# Patient Record
Sex: Female | Born: 1955 | Race: Black or African American | Hispanic: No | Smoking: Former smoker
Health system: Southern US, Community
[De-identification: ages and names within clinical notes are randomized; demographics above are authoritative.]

## PROBLEM LIST (undated history)

## (undated) DIAGNOSIS — Z9221 Personal history of antineoplastic chemotherapy: Secondary | ICD-10-CM

## (undated) DIAGNOSIS — E559 Vitamin D deficiency, unspecified: Secondary | ICD-10-CM

## (undated) DIAGNOSIS — D649 Anemia, unspecified: Secondary | ICD-10-CM

## (undated) DIAGNOSIS — Z9981 Dependence on supplemental oxygen: Secondary | ICD-10-CM

## (undated) DIAGNOSIS — F419 Anxiety disorder, unspecified: Secondary | ICD-10-CM

## (undated) DIAGNOSIS — I509 Heart failure, unspecified: Secondary | ICD-10-CM

## (undated) DIAGNOSIS — R011 Cardiac murmur, unspecified: Secondary | ICD-10-CM

## (undated) DIAGNOSIS — E785 Hyperlipidemia, unspecified: Secondary | ICD-10-CM

## (undated) DIAGNOSIS — F329 Major depressive disorder, single episode, unspecified: Secondary | ICD-10-CM

## (undated) DIAGNOSIS — I890 Lymphedema, not elsewhere classified: Secondary | ICD-10-CM

## (undated) DIAGNOSIS — J449 Chronic obstructive pulmonary disease, unspecified: Secondary | ICD-10-CM

## (undated) DIAGNOSIS — N184 Chronic kidney disease, stage 4 (severe): Secondary | ICD-10-CM

## (undated) DIAGNOSIS — C801 Malignant (primary) neoplasm, unspecified: Secondary | ICD-10-CM

## (undated) DIAGNOSIS — Z7901 Long term (current) use of anticoagulants: Secondary | ICD-10-CM

## (undated) DIAGNOSIS — Z923 Personal history of irradiation: Secondary | ICD-10-CM

## (undated) DIAGNOSIS — I4891 Unspecified atrial fibrillation: Secondary | ICD-10-CM

## (undated) DIAGNOSIS — K219 Gastro-esophageal reflux disease without esophagitis: Secondary | ICD-10-CM

## (undated) DIAGNOSIS — F32A Depression, unspecified: Secondary | ICD-10-CM

## (undated) DIAGNOSIS — I10 Essential (primary) hypertension: Secondary | ICD-10-CM

## (undated) DIAGNOSIS — C3491 Malignant neoplasm of unspecified part of right bronchus or lung: Secondary | ICD-10-CM

## (undated) DIAGNOSIS — IMO0001 Reserved for inherently not codable concepts without codable children: Secondary | ICD-10-CM

## (undated) DIAGNOSIS — C50919 Malignant neoplasm of unspecified site of unspecified female breast: Secondary | ICD-10-CM

## (undated) DIAGNOSIS — R0609 Other forms of dyspnea: Secondary | ICD-10-CM

## (undated) DIAGNOSIS — R911 Solitary pulmonary nodule: Secondary | ICD-10-CM

## (undated) DIAGNOSIS — E119 Type 2 diabetes mellitus without complications: Secondary | ICD-10-CM

## (undated) DIAGNOSIS — I700 Atherosclerosis of aorta: Secondary | ICD-10-CM

## (undated) DIAGNOSIS — M1990 Unspecified osteoarthritis, unspecified site: Secondary | ICD-10-CM

## (undated) HISTORY — PX: ROTATOR CUFF REPAIR: SHX139

## (undated) HISTORY — PX: OTHER SURGICAL HISTORY: SHX169

## (undated) HISTORY — PX: BREAST SURGERY: SHX581

## (undated) HISTORY — PX: LUNG BIOPSY: SHX232

## (undated) HISTORY — PX: MASTECTOMY: SHX3

## (undated) HISTORY — PX: DILATION AND CURETTAGE OF UTERUS: SHX78

## (undated) HISTORY — PX: ILEOSTOMY: SHX1783

---

## 1998-10-15 DIAGNOSIS — C50919 Malignant neoplasm of unspecified site of unspecified female breast: Secondary | ICD-10-CM

## 1998-10-15 DIAGNOSIS — C50912 Malignant neoplasm of unspecified site of left female breast: Secondary | ICD-10-CM

## 1998-10-15 HISTORY — DX: Malignant neoplasm of unspecified site of left female breast: C50.912

## 1998-10-15 HISTORY — DX: Malignant neoplasm of unspecified site of unspecified female breast: C50.919

## 1999-10-16 DIAGNOSIS — Z923 Personal history of irradiation: Secondary | ICD-10-CM

## 1999-10-16 DIAGNOSIS — Z9221 Personal history of antineoplastic chemotherapy: Secondary | ICD-10-CM

## 1999-10-16 HISTORY — DX: Personal history of antineoplastic chemotherapy: Z92.21

## 1999-10-16 HISTORY — DX: Personal history of irradiation: Z92.3

## 2004-08-31 ENCOUNTER — Ambulatory Visit: Payer: Self-pay | Admitting: Oncology

## 2004-09-14 ENCOUNTER — Ambulatory Visit: Payer: Self-pay | Admitting: Oncology

## 2004-11-12 ENCOUNTER — Emergency Department: Payer: Self-pay | Admitting: Unknown Physician Specialty

## 2004-12-11 ENCOUNTER — Ambulatory Visit: Payer: Self-pay | Admitting: Oncology

## 2005-01-12 ENCOUNTER — Ambulatory Visit: Payer: Self-pay | Admitting: Internal Medicine

## 2005-04-12 ENCOUNTER — Ambulatory Visit: Payer: Self-pay | Admitting: Oncology

## 2005-04-14 ENCOUNTER — Ambulatory Visit: Payer: Self-pay | Admitting: Oncology

## 2005-06-28 ENCOUNTER — Ambulatory Visit: Payer: Self-pay | Admitting: Oncology

## 2005-12-11 ENCOUNTER — Ambulatory Visit: Payer: Self-pay | Admitting: Oncology

## 2006-02-06 ENCOUNTER — Ambulatory Visit: Payer: Self-pay | Admitting: Oncology

## 2006-02-12 ENCOUNTER — Ambulatory Visit: Payer: Self-pay | Admitting: Oncology

## 2006-02-24 ENCOUNTER — Emergency Department: Payer: Self-pay | Admitting: Emergency Medicine

## 2006-02-25 ENCOUNTER — Other Ambulatory Visit: Payer: Self-pay

## 2006-03-15 ENCOUNTER — Ambulatory Visit: Payer: Self-pay | Admitting: Oncology

## 2006-10-25 ENCOUNTER — Ambulatory Visit: Payer: Self-pay | Admitting: Oncology

## 2006-11-13 ENCOUNTER — Emergency Department: Payer: Self-pay | Admitting: Emergency Medicine

## 2006-12-13 ENCOUNTER — Ambulatory Visit: Payer: Self-pay | Admitting: Oncology

## 2007-04-15 ENCOUNTER — Ambulatory Visit: Payer: Self-pay | Admitting: Oncology

## 2007-06-16 ENCOUNTER — Ambulatory Visit: Payer: Self-pay | Admitting: Oncology

## 2007-06-18 ENCOUNTER — Ambulatory Visit: Payer: Self-pay | Admitting: Oncology

## 2007-07-13 IMAGING — CR DG CHEST 2V
1 series · 4 of 4 positions shown · non-contrast
Comparison: none

REASON FOR EXAM: Abdominal pain
COMMENTS:

PROCEDURE:     DXR - DXR CHEST PA (OR AP) AND LATERAL  - February 25, 2006  [DATE]
RESULT:       PA and lateral views of the chest show the lungs are fully
inflated and clear.  The cardiac silhouette and pulmonary vasculature are
normal.  The bony and mediastinal structures are unremarkable.

[Series 1: view not recorded · 0.17mm/px · 4 of 4 slices shown]
[im 1/4]
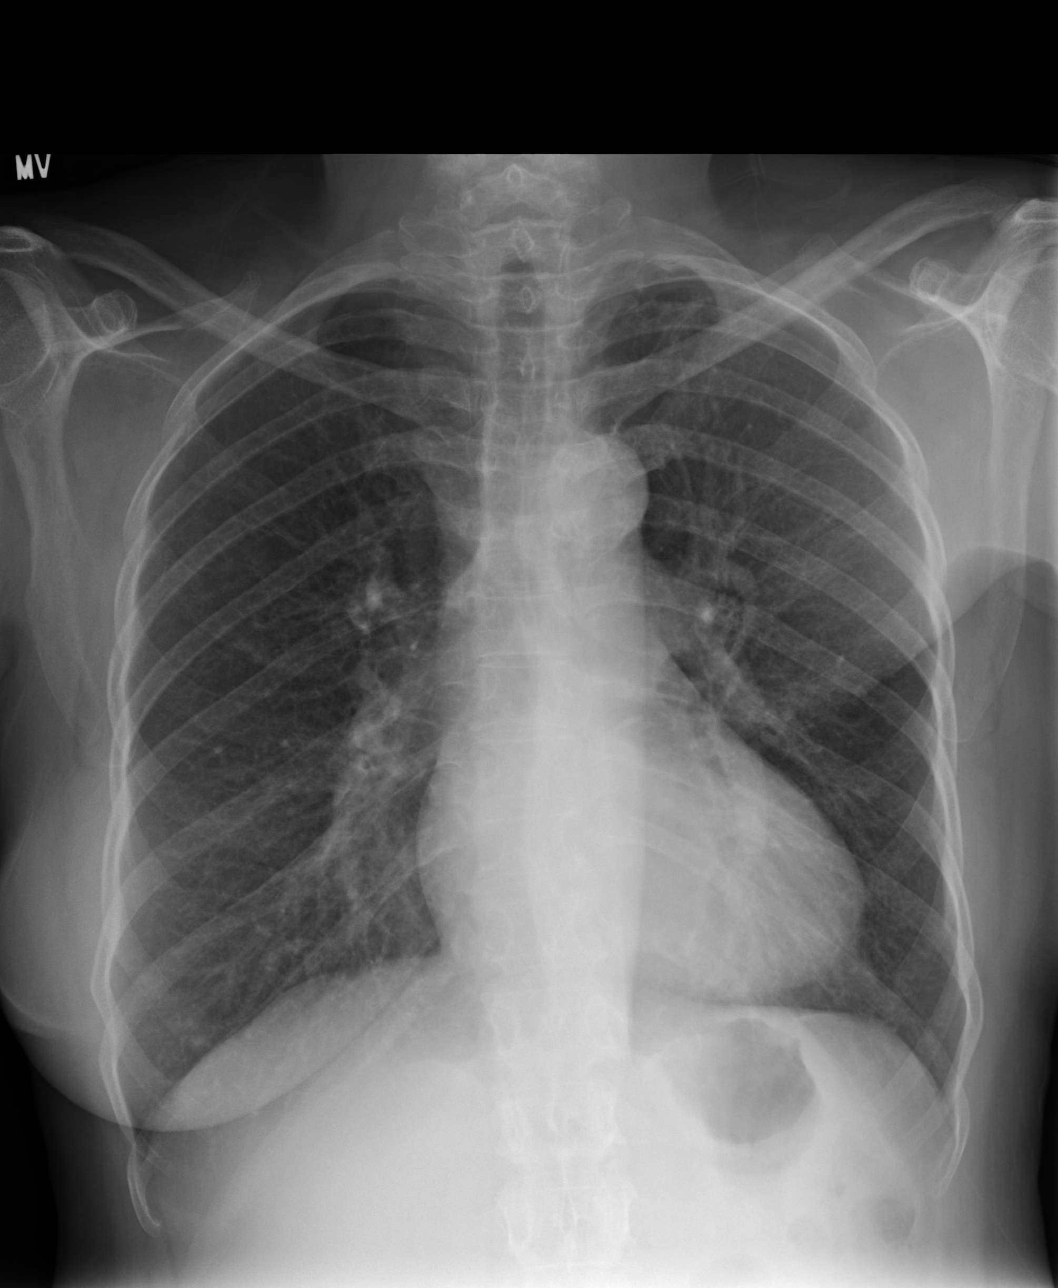
[im 2/4]
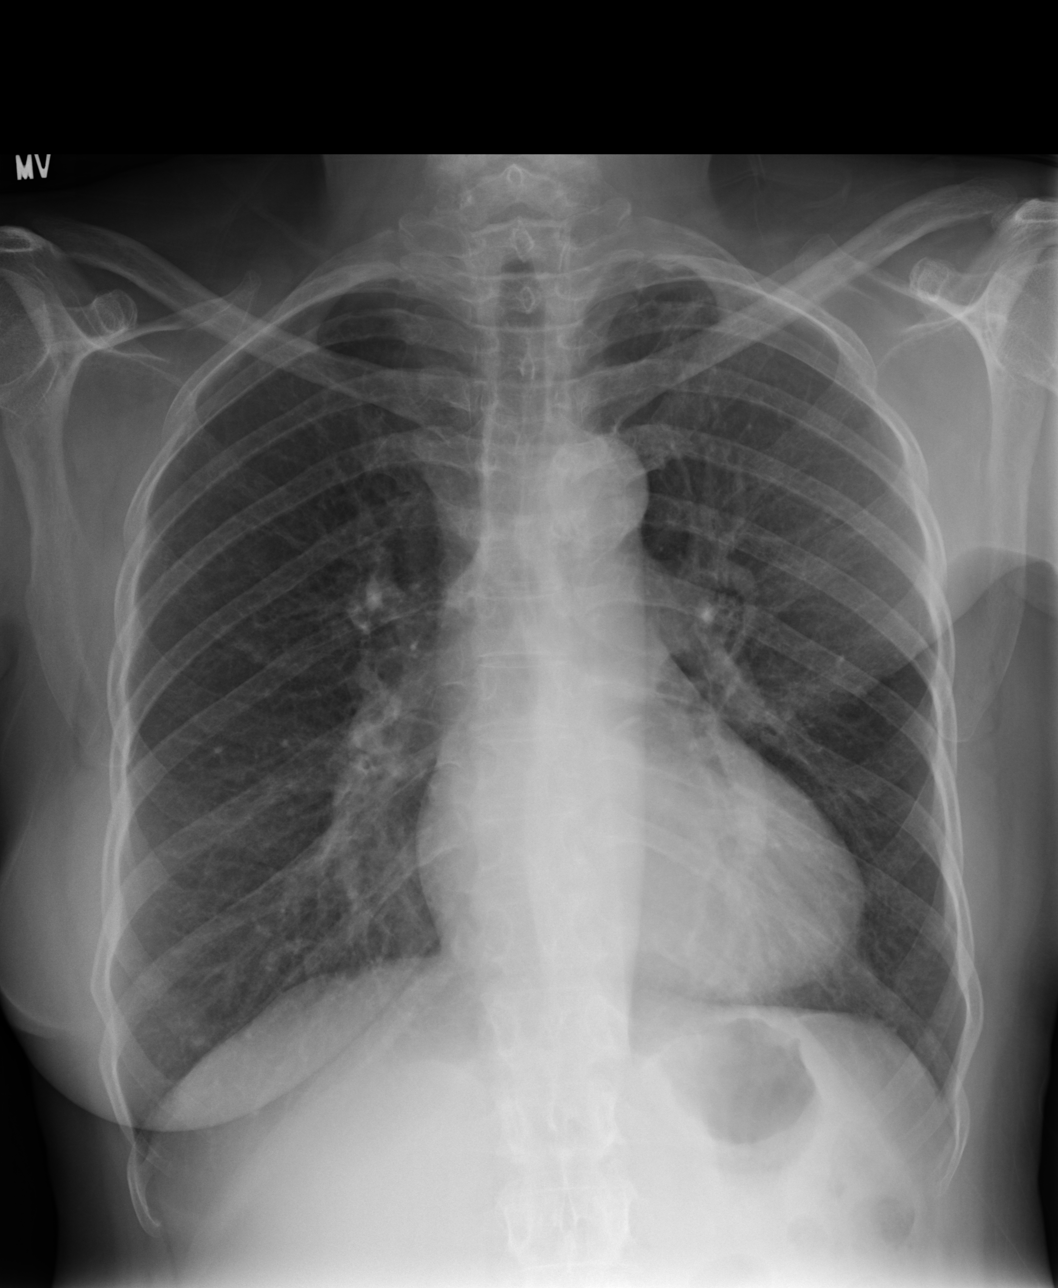
[im 3/4]
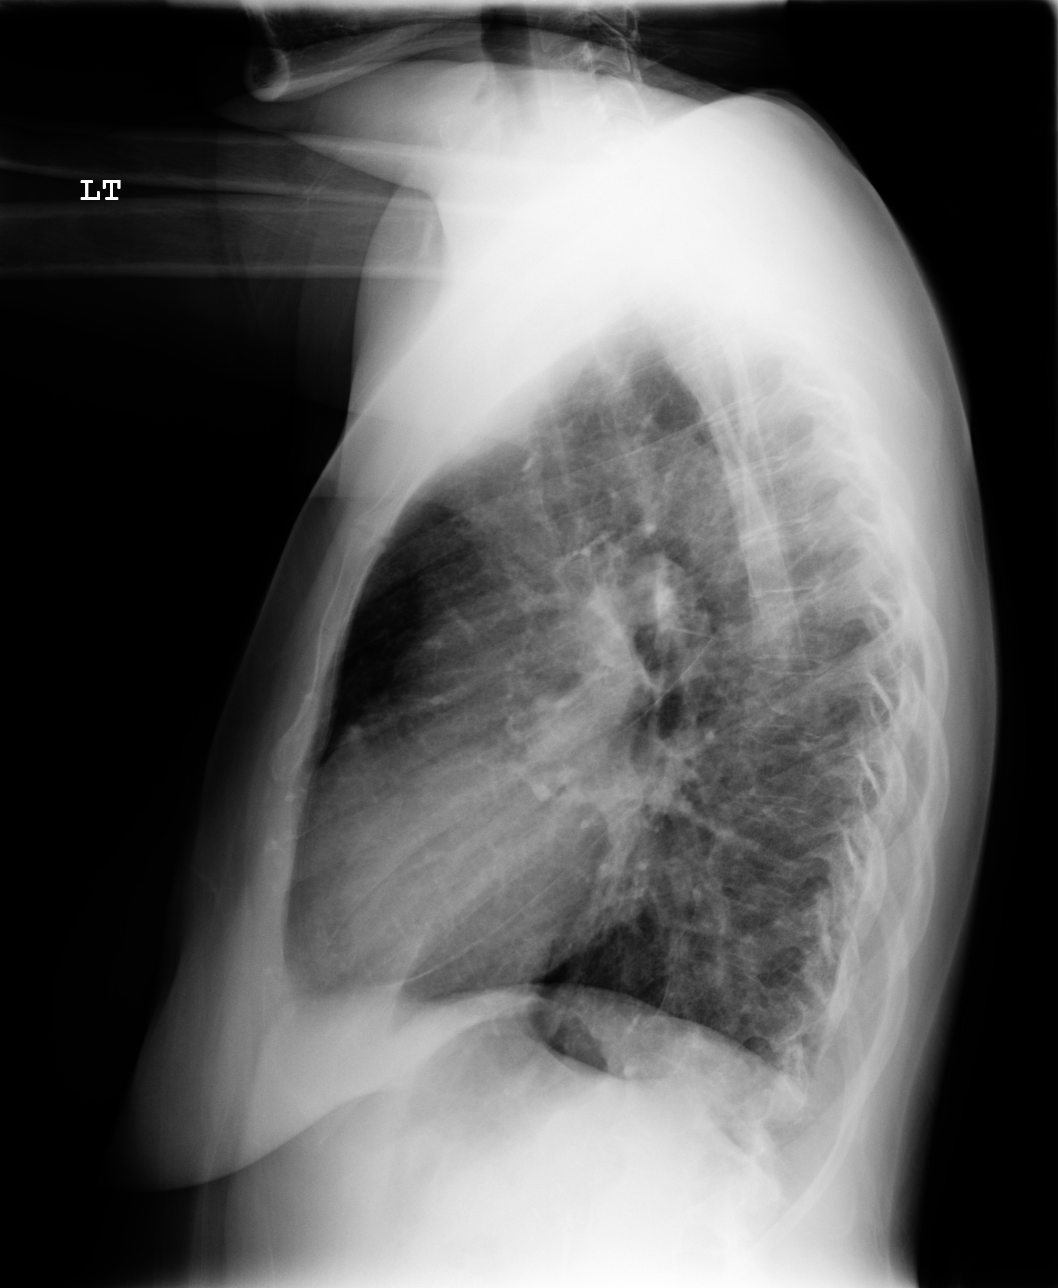
[im 4/4]
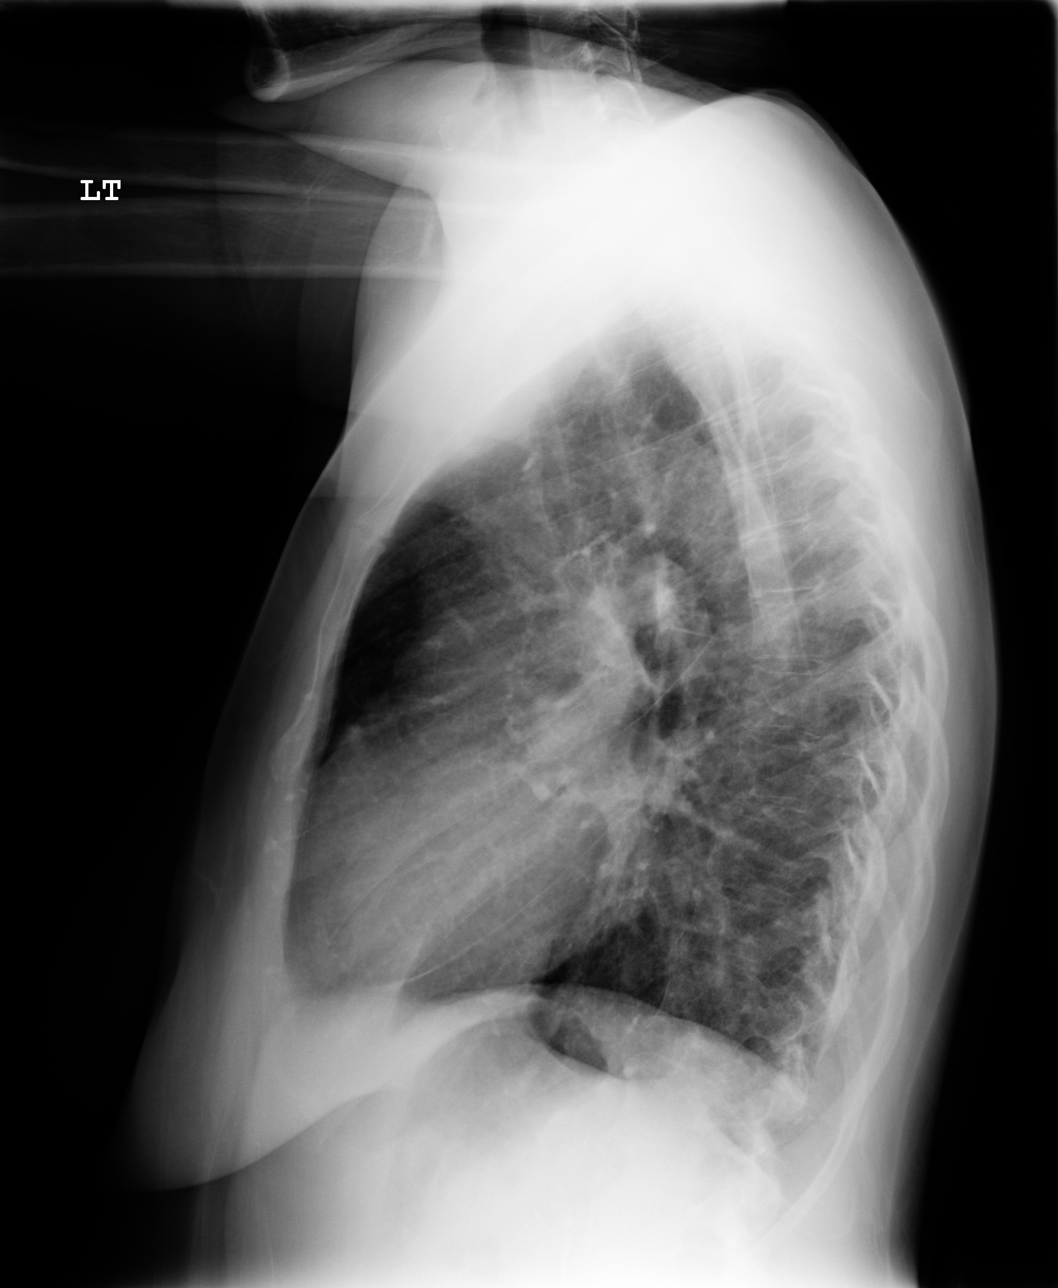

[4 of 4 positions shown; findings below may reference images not displayed]

IMPRESSION: No acute cardiopulmonary disease evident.

## 2007-07-13 IMAGING — CT CT ABD-PELV W/ CM
1 of 2 series · 16 of 32 positions shown, 20 images · non-contrast
Comparison: none

REASON FOR EXAM: Evaluation appendix. Rm 14
COMMENTS:

[Series 2: appendicitis · axial · 0.65mm/px · z∈[-250,+101]mm · 16 of 127 slices shown, 20 images]
[im 5/127  soft-tissue]
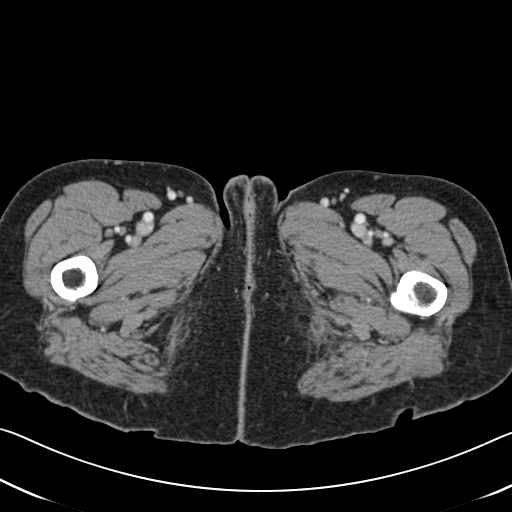
[im 5/127  bone]
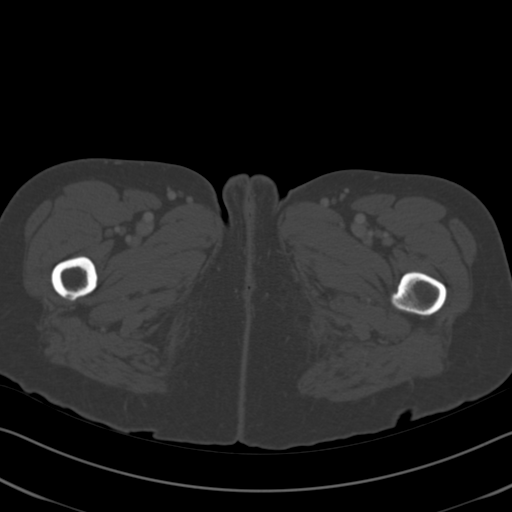
[im 15/127  soft-tissue]
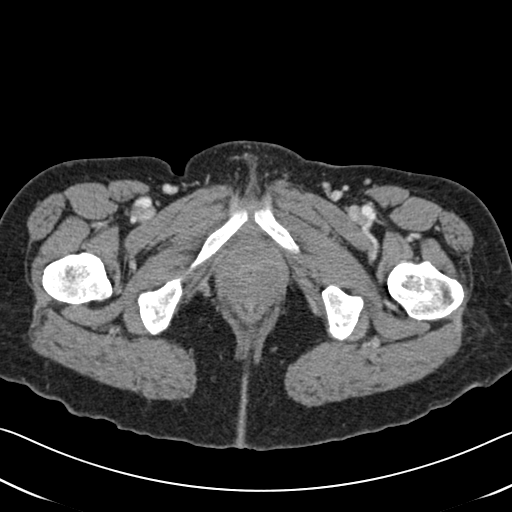
[im 25/127  soft-tissue]
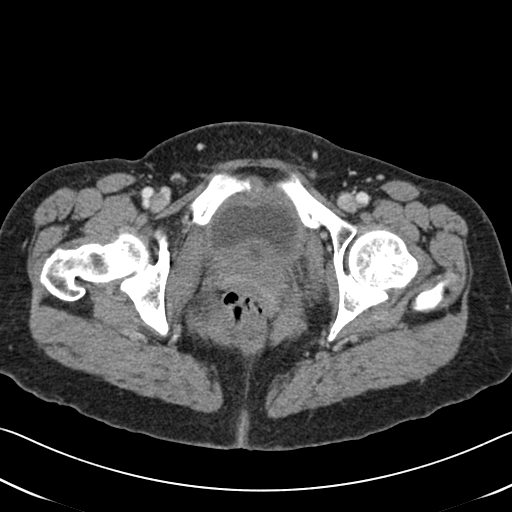
[im 34/127  soft-tissue]
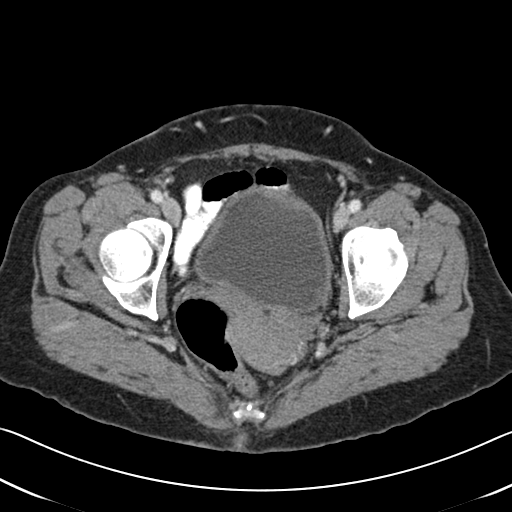
[im 44/127  soft-tissue]
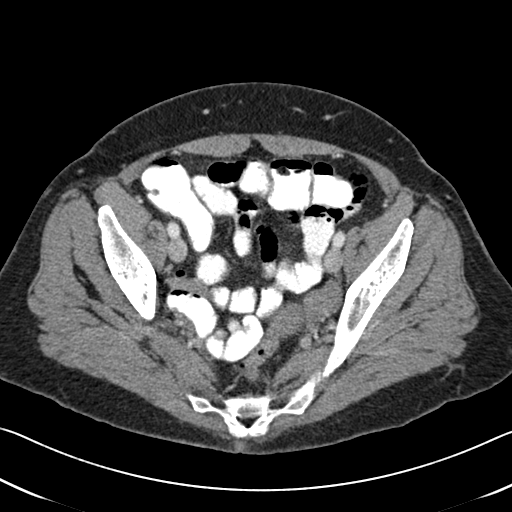
[im 49/127  soft-tissue]
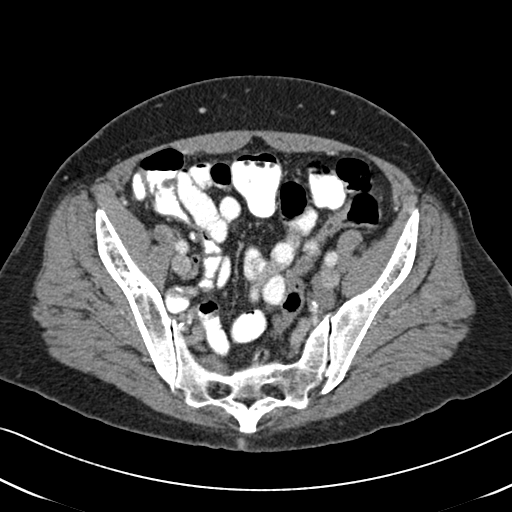
[im 59/127  soft-tissue]
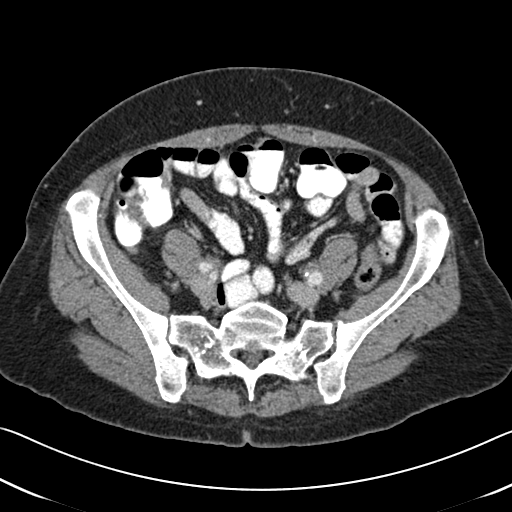
[im 68/127  soft-tissue]
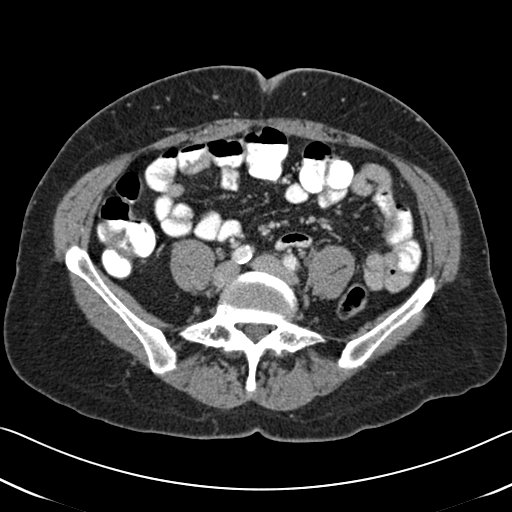
[im 78/127  soft-tissue]
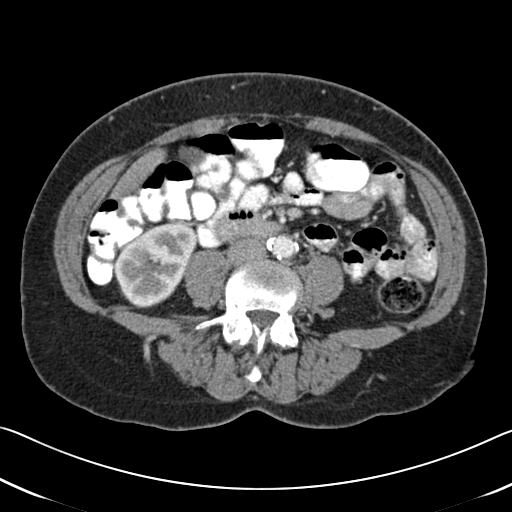
[im 78/127  bone]
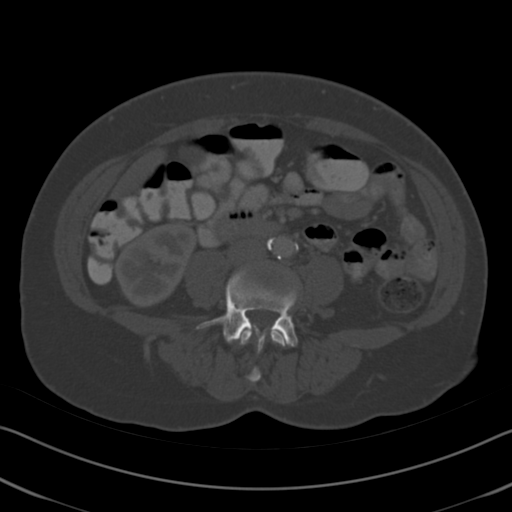
[im 83/127  soft-tissue]
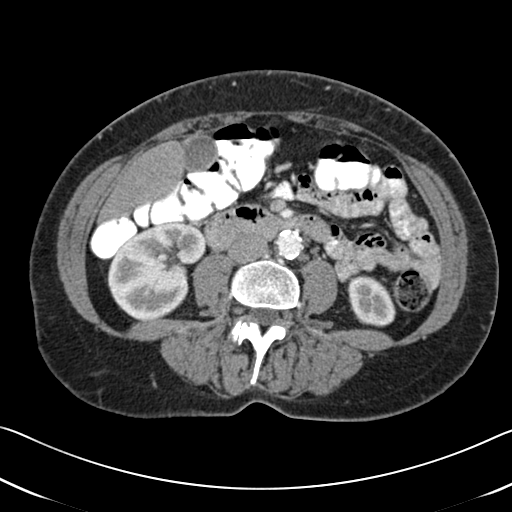
[im 93/127  soft-tissue]
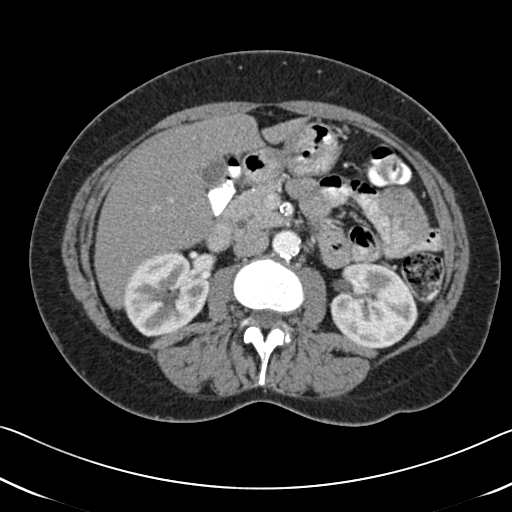
[im 102/127  soft-tissue]
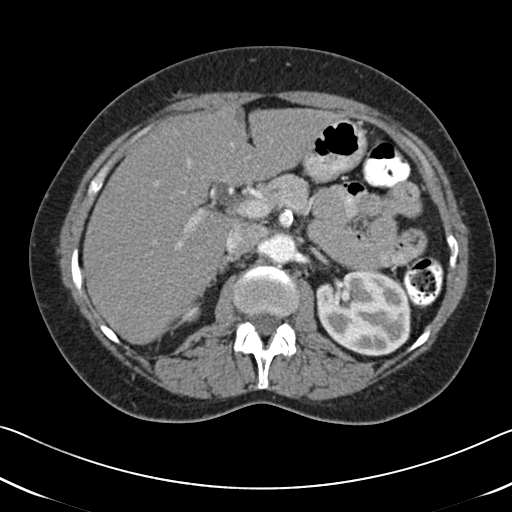
[im 107/127  lung]
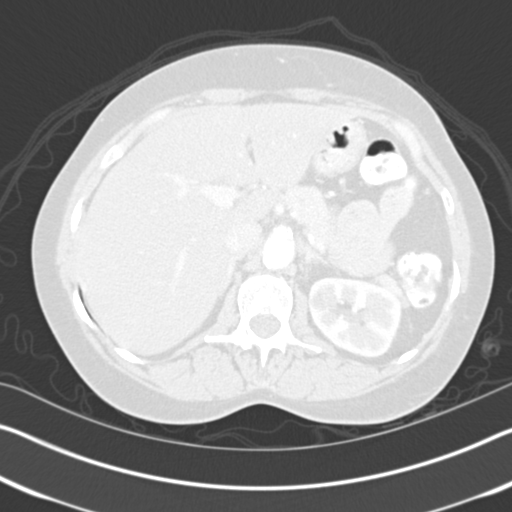
[im 112/127  soft-tissue]
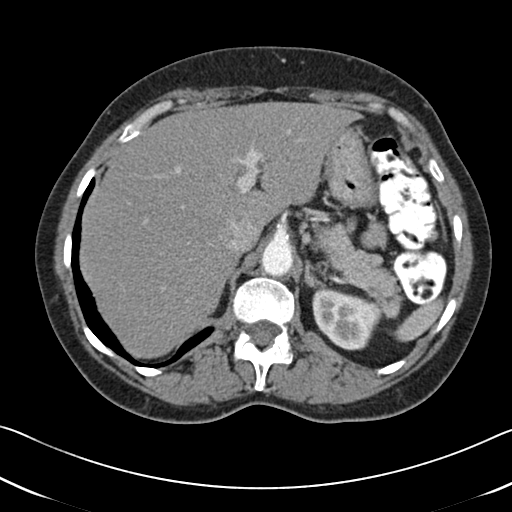
[im 112/127  lung]
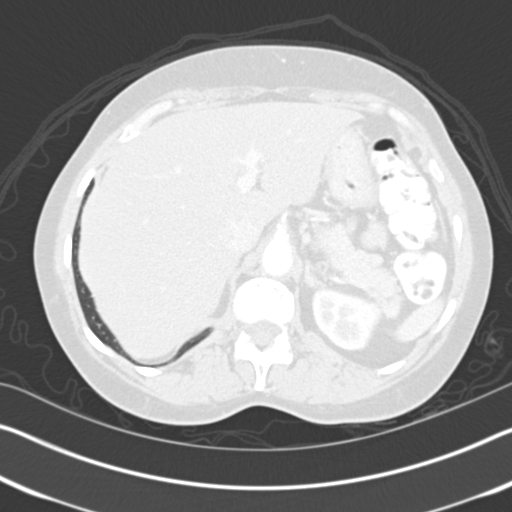
[im 117/127  lung]
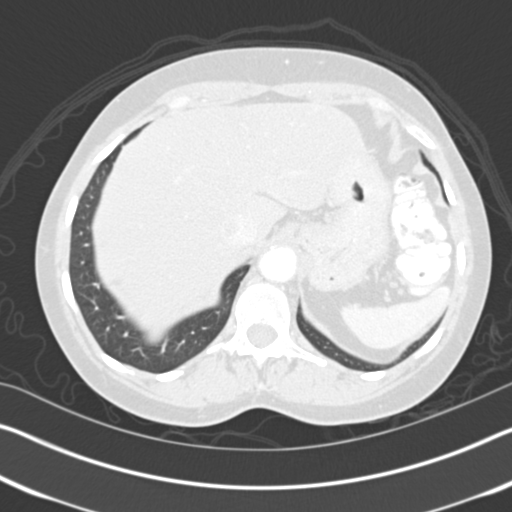
[im 122/127  soft-tissue]
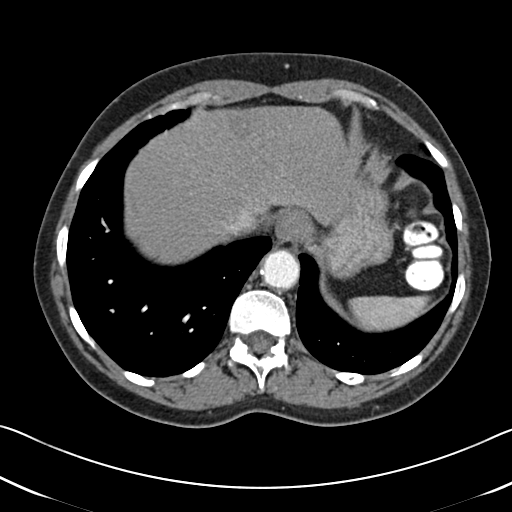
[im 122/127  lung]
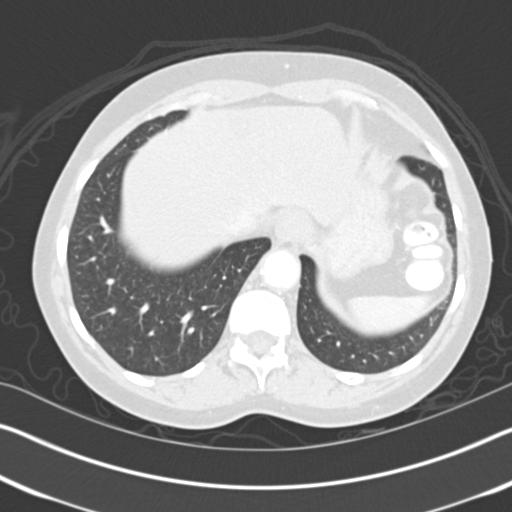

[16 of 32 positions shown; findings below may reference images not displayed]

PROCEDURE:     CT  - CT ABDOMEN / PELVIS  W  - February 25, 2006  [DATE]

RESULT:     Spiral 3 mm sections were obtained from the lung bases through
the pubic symphysis status post intravenous administration of 100 ml of
Asovue-6J1 and oral contrast.

Evaluation of the lung bases demonstrates no gross abnormalities.

The liver, spleen, adrenals, pancreas, and kidneys are unremarkable.
Evaluation of the lower abdomen demonstrates no PT evidence to suggest
sequela of appendicitis.  The appendix is identified and there is an
unremarkable CT appearance.

Evaluation of the pelvis demonstrates no evidence of free fluid, drainable
loculated fluid collections, masses or adenopathy.
IMPRESSION: No CT evidence to suggest sequela of appendicitis.

A preliminary faxed report was relayed to Dr. Elkin Manuel Malpud of the emergency
department on 02/25/06 at [DATE] a.m. EST.

## 2007-07-16 ENCOUNTER — Ambulatory Visit: Payer: Self-pay | Admitting: Oncology

## 2008-01-12 ENCOUNTER — Ambulatory Visit: Payer: Self-pay | Admitting: Oncology

## 2008-01-14 ENCOUNTER — Ambulatory Visit: Payer: Self-pay | Admitting: Oncology

## 2008-01-16 ENCOUNTER — Ambulatory Visit: Payer: Self-pay | Admitting: Oncology

## 2008-02-13 ENCOUNTER — Ambulatory Visit: Payer: Self-pay | Admitting: Oncology

## 2008-03-30 IMAGING — CR DG CHEST 2V
1 series · 2 of 2 positions shown · non-contrast
Comparison: none

REASON FOR EXAM: cough
COMMENTS:

PROCEDURE:     DXR - DXR CHEST PA (OR AP) AND LATERAL  - November 13, 2006  [DATE]
RESULT:     Mild infiltrate in the LEFT lower lobe is noted.  The RIGHT lung
is clear.  Cardiovascular structures are unremarkable.  The patient has had
a LEFT mastectomy.  No bony abnormalities are identified.

[Series 1: view not recorded · 0.17mm/px · 2 of 2 slices shown]
[im 1/2]
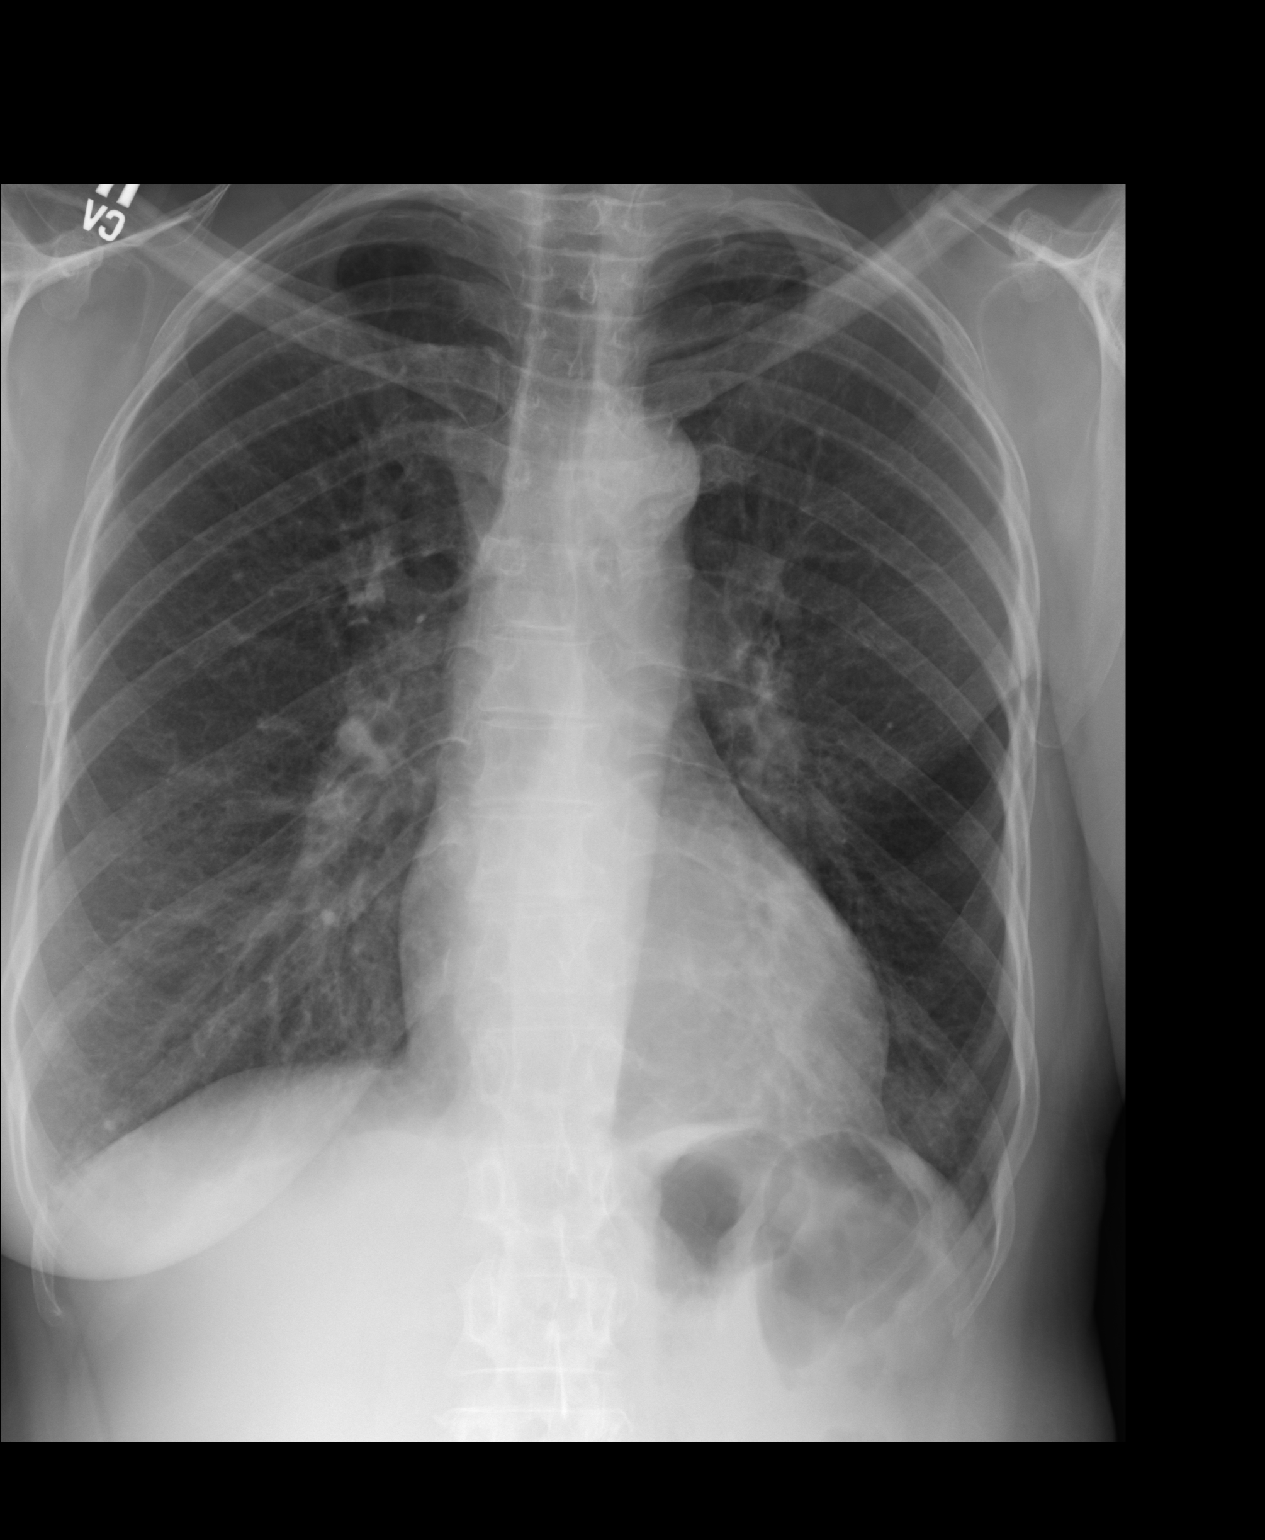
[im 2/2]
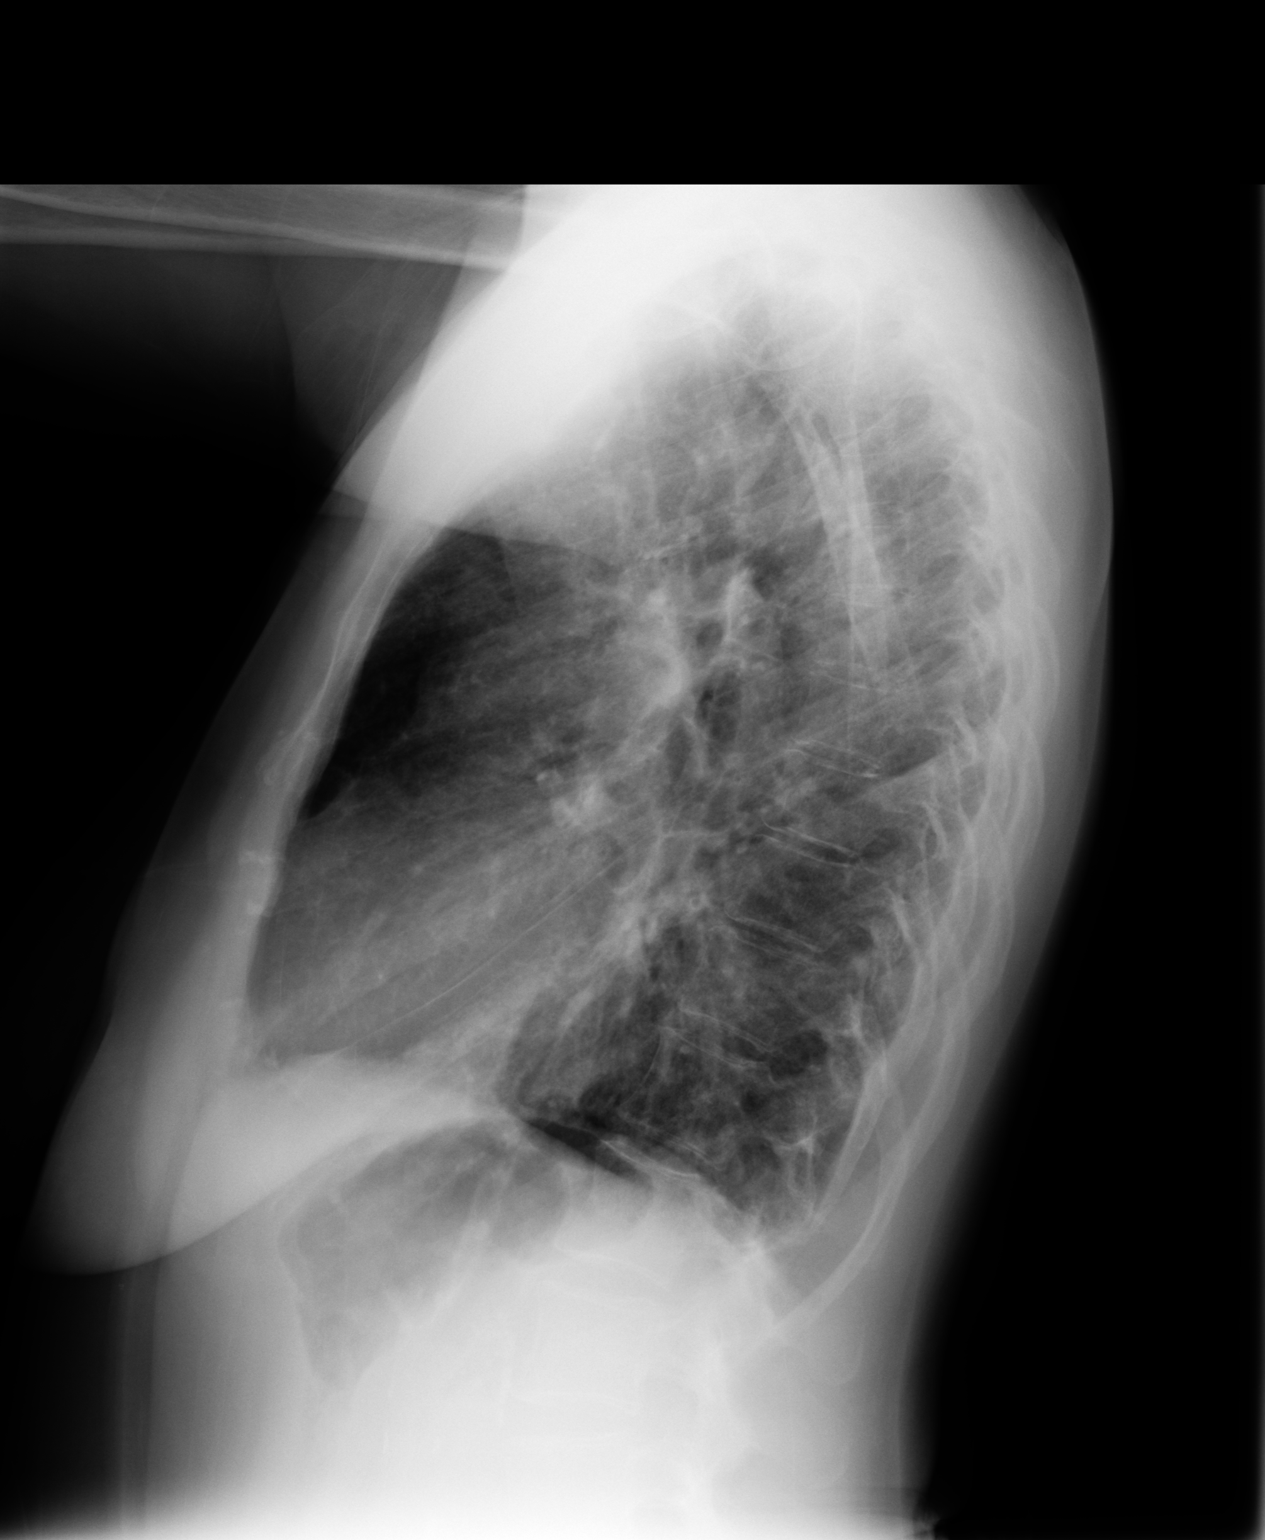

[2 of 2 positions shown; findings below may reference images not displayed]

IMPRESSION: Mild infiltrate LEFT lower lobe consistent with mild pneumonia.  Followup
chest x-ray is recommended to demonstrate complete clearing.

## 2008-05-15 ENCOUNTER — Ambulatory Visit: Payer: Self-pay | Admitting: Oncology

## 2008-07-15 ENCOUNTER — Ambulatory Visit: Payer: Self-pay | Admitting: Oncology

## 2008-07-23 ENCOUNTER — Ambulatory Visit: Payer: Self-pay | Admitting: Oncology

## 2008-08-13 ENCOUNTER — Ambulatory Visit: Payer: Self-pay | Admitting: Oncology

## 2008-08-15 ENCOUNTER — Ambulatory Visit: Payer: Self-pay | Admitting: Oncology

## 2009-01-28 ENCOUNTER — Ambulatory Visit: Payer: Self-pay | Admitting: Oncology

## 2009-03-15 ENCOUNTER — Ambulatory Visit: Payer: Self-pay | Admitting: Oncology

## 2009-03-17 ENCOUNTER — Ambulatory Visit: Payer: Self-pay | Admitting: Oncology

## 2009-04-14 ENCOUNTER — Ambulatory Visit: Payer: Self-pay | Admitting: Oncology

## 2009-07-03 ENCOUNTER — Emergency Department: Payer: Self-pay | Admitting: Unknown Physician Specialty

## 2010-01-20 ENCOUNTER — Emergency Department: Payer: Self-pay | Admitting: Emergency Medicine

## 2010-02-12 ENCOUNTER — Ambulatory Visit: Payer: Self-pay | Admitting: Oncology

## 2010-02-17 ENCOUNTER — Ambulatory Visit: Payer: Self-pay | Admitting: Oncology

## 2010-03-15 ENCOUNTER — Ambulatory Visit: Payer: Self-pay | Admitting: Oncology

## 2010-03-27 ENCOUNTER — Encounter: Payer: Self-pay | Admitting: Oncology

## 2010-04-14 ENCOUNTER — Encounter: Payer: Self-pay | Admitting: Oncology

## 2010-05-15 ENCOUNTER — Encounter: Payer: Self-pay | Admitting: Oncology

## 2010-06-15 ENCOUNTER — Encounter: Payer: Self-pay | Admitting: Oncology

## 2010-11-18 IMAGING — CR DG CHEST 2V
1 series · 2 of 2 positions shown · non-contrast
Comparison: none

REASON FOR EXAM: difficulty breathing
COMMENTS:

[Series 1: view not recorded · 0.17mm/px · 2 of 2 slices shown]
[im 1/2]
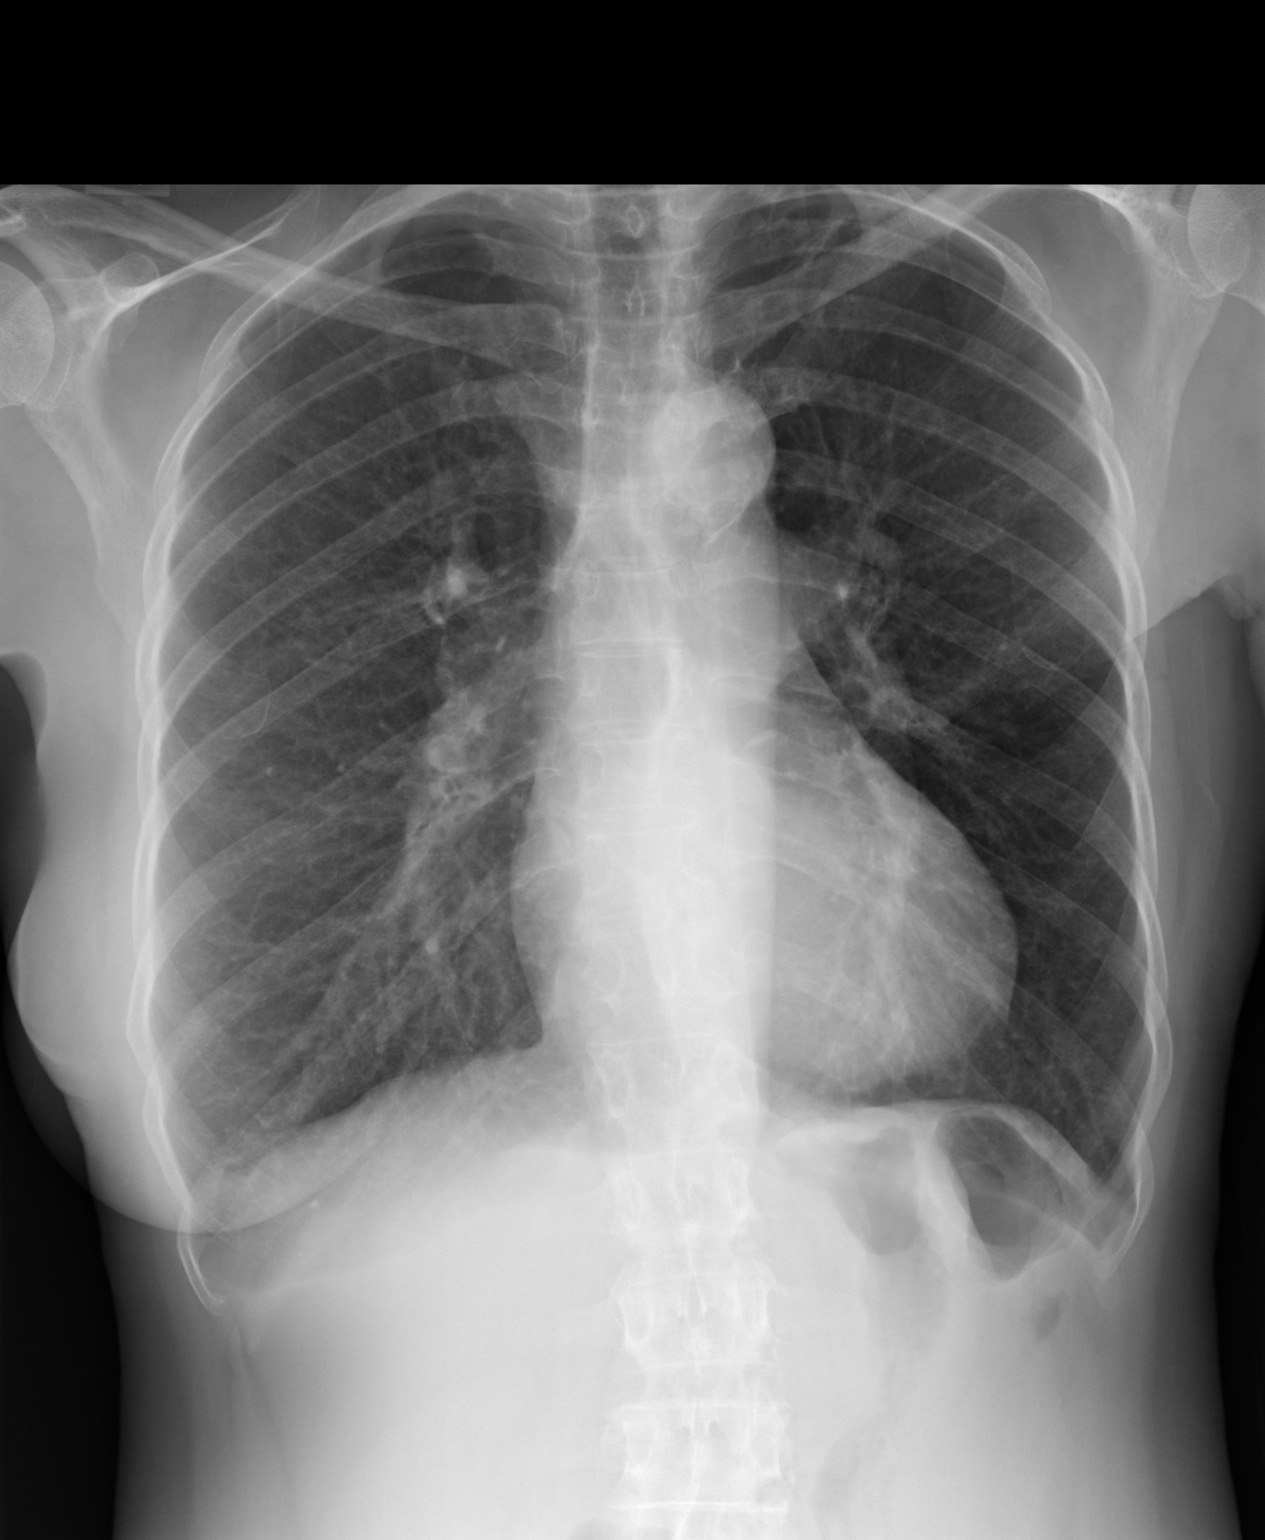
[im 2/2]
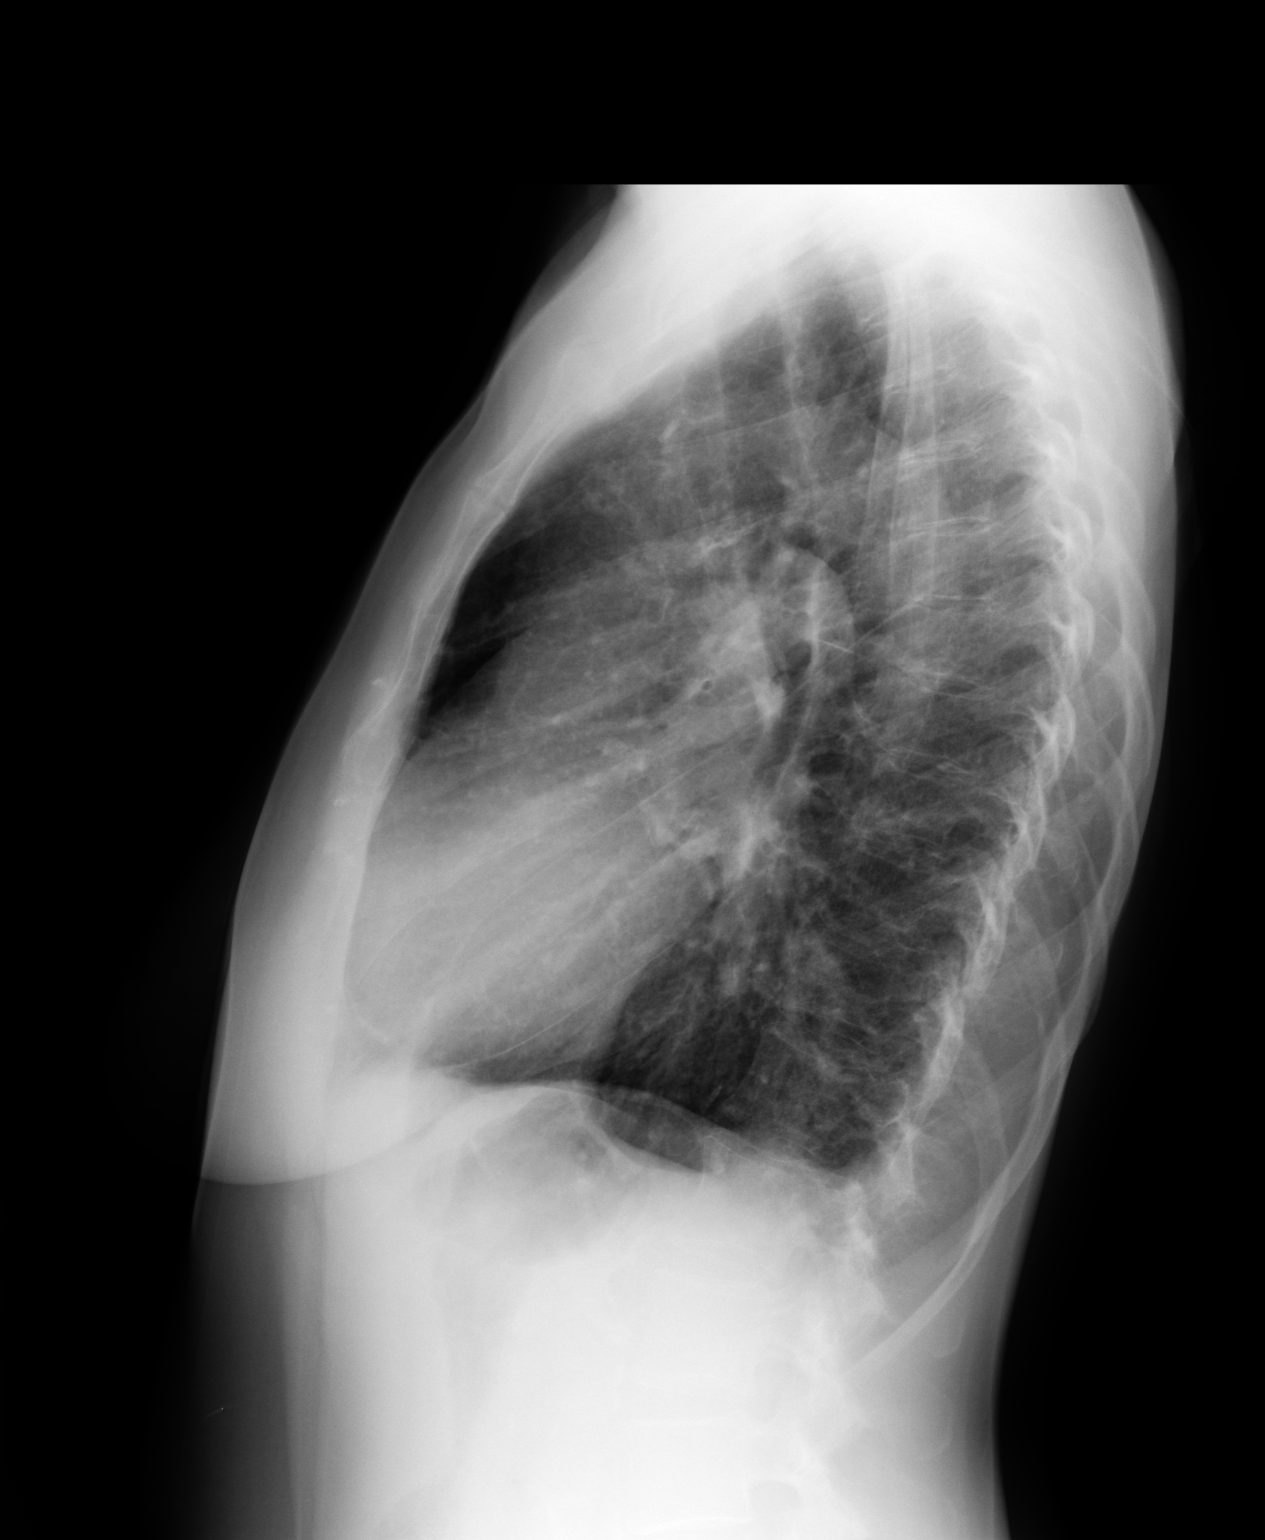

[2 of 2 positions shown; findings below may reference images not displayed]

PROCEDURE:     DXR - DXR CHEST PA (OR AP) AND LATERAL  - July 03, 2009  [DATE]

RESULT:     Comparison is made to the study of 11/13/2006.

The lungs are hyperinflated. The lungs are clear. The heart and pulmonary
vessels are normal. The bony and mediastinal structures are unremarkable.
There is no effusion. There is no pneumothorax or evidence of congestive
failure.
IMPRESSION: No acute cardiopulmonary disease. Hyperinflation consistent
with COPD.

## 2011-07-11 IMAGING — CT CT CHEST-ABD W/ CM
1 of 3 series · 12 of 32 positions shown, 18 images · non-contrast
Comparison: none

REASON FOR EXAM: breast CA  weight loss
COMMENTS:

PROCEDURE:     CT  - CT CHEST AND ABDOMEN W  - February 23, 2010 [DATE]
RESULT:
TECHNIQUE: CT of the chest and abdomen is performed with oral contrast and
85 ml of Asovue-73K iodinated intravenous contrast. Images are reconstructed
in the axial plane at 5 mm slice thickness.
There is no previous chest CT for comparison. An abdominal and pelvic CT was
performed on 02/25/2006 and used for comparison.

[Series 2: soft tissue · axial · 0.66mm/px · z∈[+308,+672]mm · 12 of 87 slices shown, 18 images]
[im 7/87  soft-tissue]
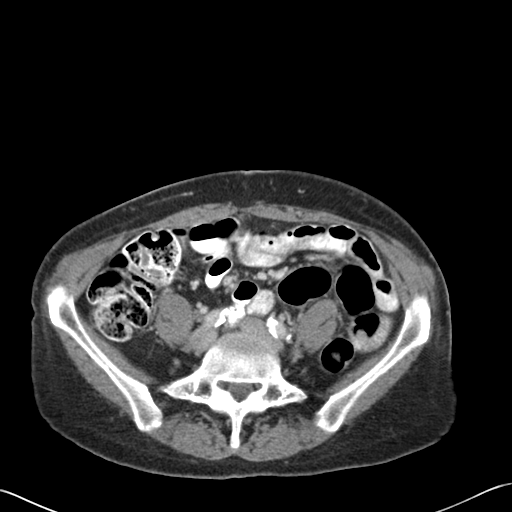
[im 7/87  bone]
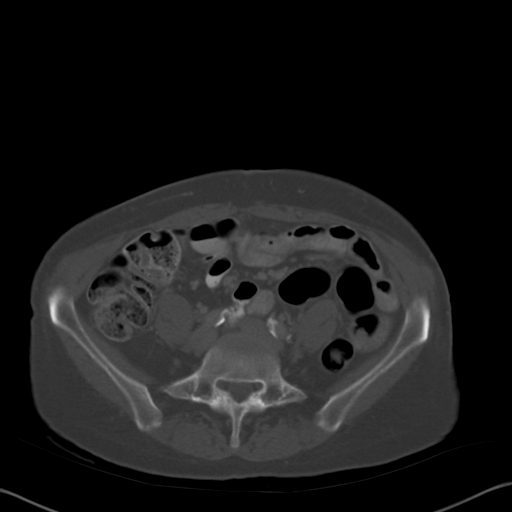
[im 13/87  soft-tissue]
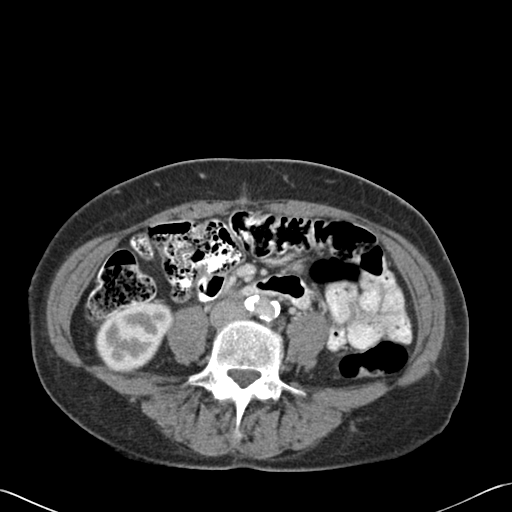
[im 19/87  soft-tissue]
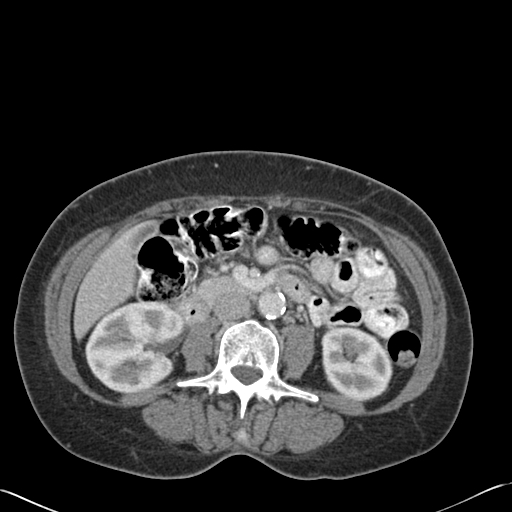
[im 25/87  soft-tissue]
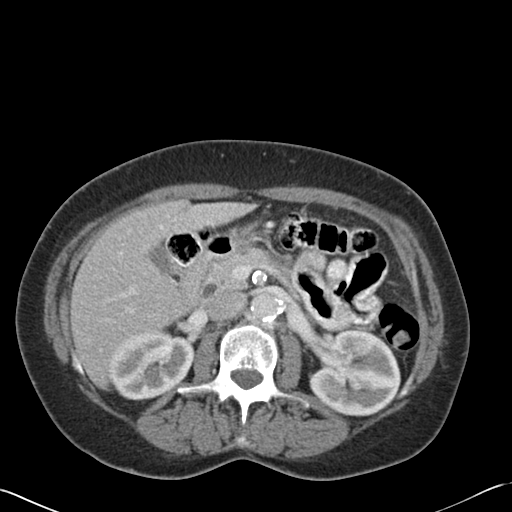
[im 31/87  soft-tissue]
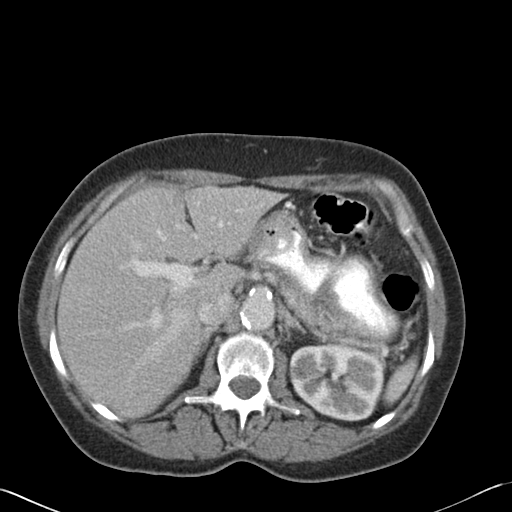
[im 37/87  soft-tissue]
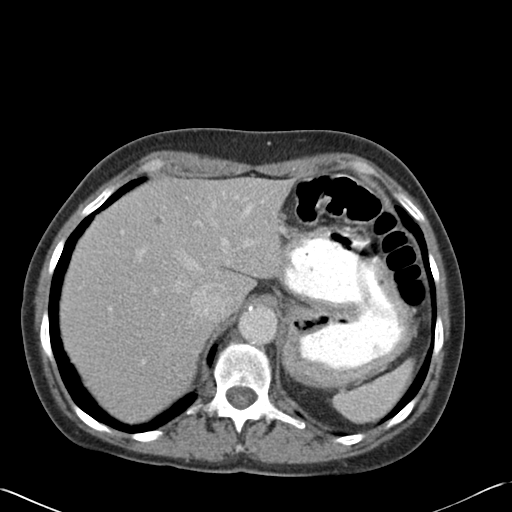
[im 50/87  soft-tissue]
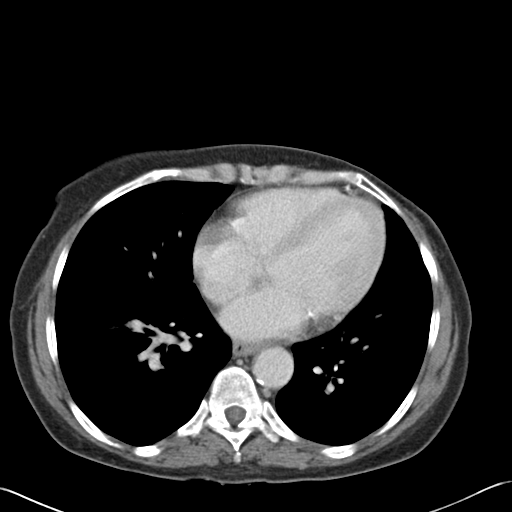
[im 56/87  soft-tissue]
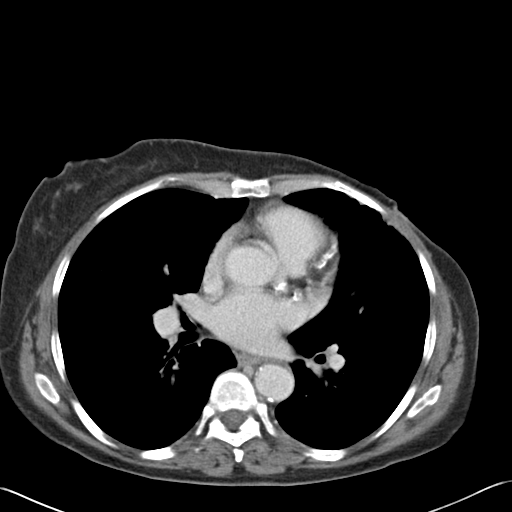
[im 62/87  soft-tissue]
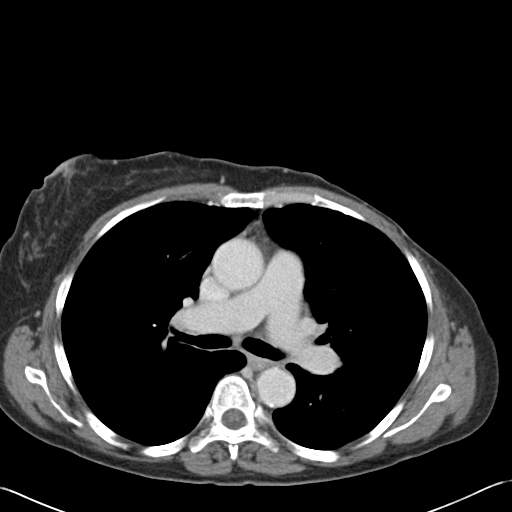
[im 62/87  lung]
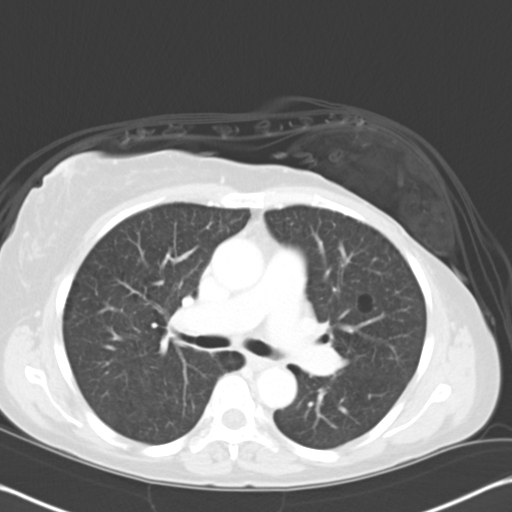
[im 62/87  bone]
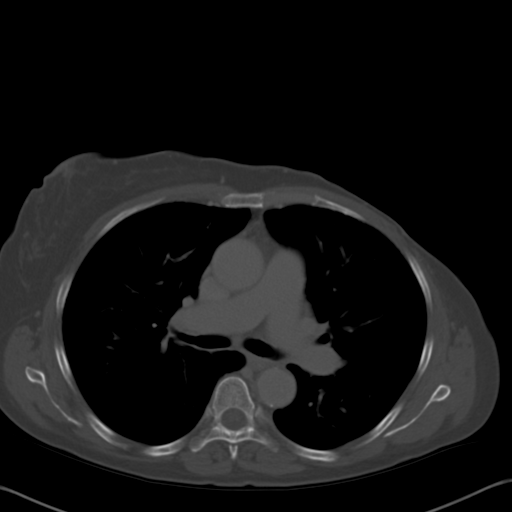
[im 68/87  soft-tissue]
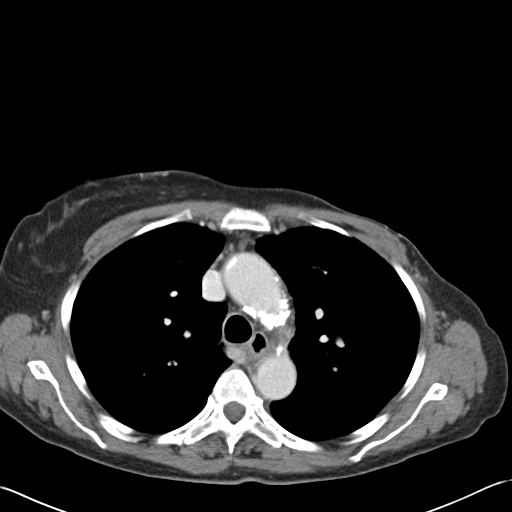
[im 68/87  lung]
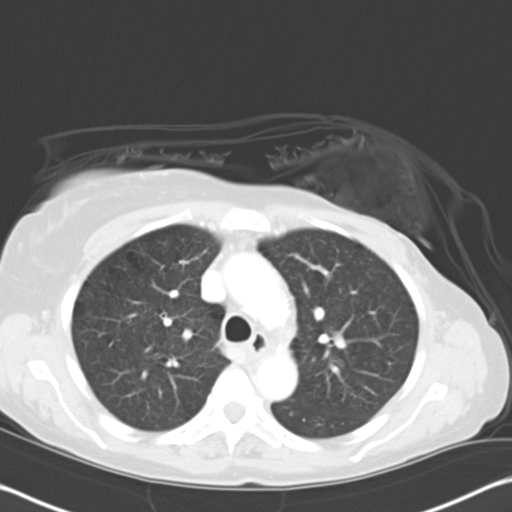
[im 74/87  soft-tissue]
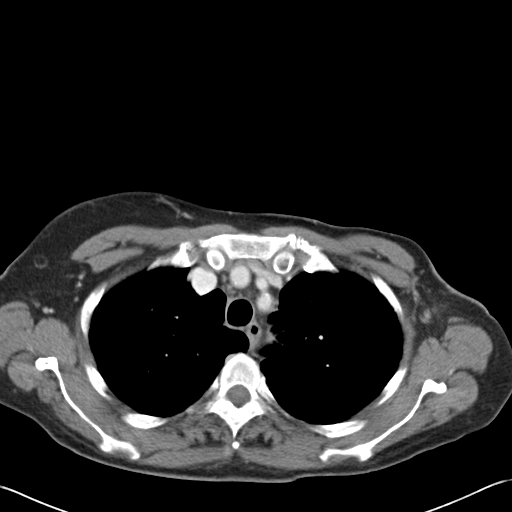
[im 74/87  lung]
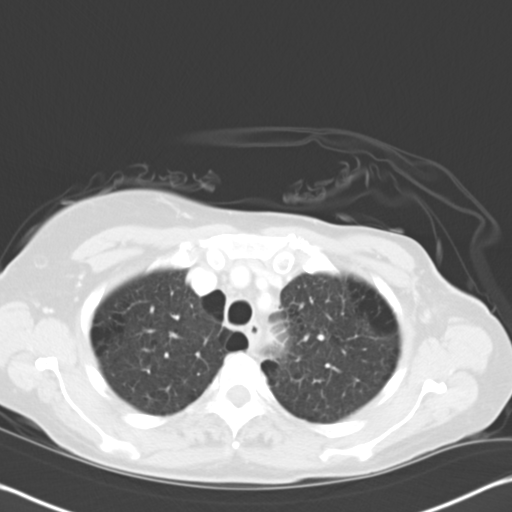
[im 80/87  soft-tissue]
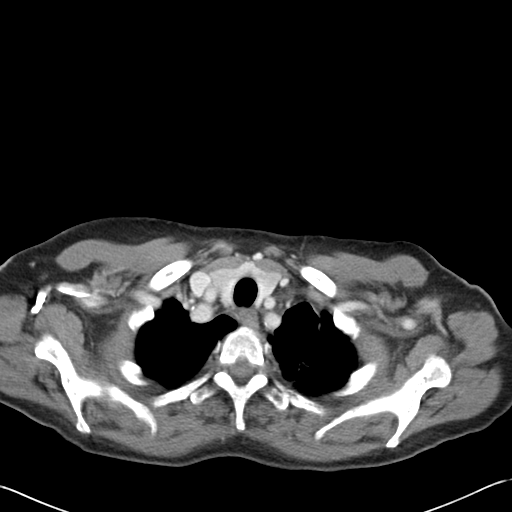
[im 80/87  lung]
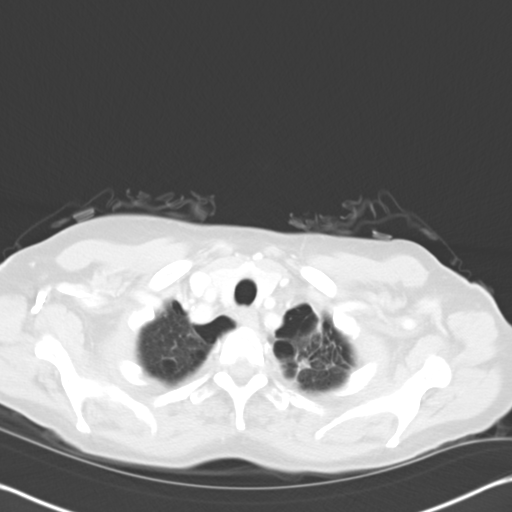

[12 of 32 positions shown; findings below may reference images not displayed]

FINDINGS: Lung window images demonstrate some patchy ill-defined anterior
right middle lobe increased density with irregular margins and some bullous
changes adjacent to it. This is nonspecific. At lung window settings, this
area measures approximately 10 mm in size anterior to posterior and 8 mm in
size medial to lateral. No interstitial edema, effusion or pneumothorax is
present. Some scattered bullous regions are seen especially in the lung
apices where there is also some curvilinear presumed fibrosis especially on
the left. No pulmonary parenchymal mass is present otherwise. No
mediastinal, supraclavicular, axillary or hilar mass or adenopathy is
evident.

There is a tiny, low-attenuation area in the right lobe of the liver on
image #51 which appears too small for accurate characterization at 4 mm.
This may represent a tiny cyst. A second similar area is seen in the
posterior subdiaphragmatic right lobe of the liver. The gallbladder is
nondistended. The spleen is normal in appearance. The kidneys enhance
symmetrically without mass or obstruction. The adrenal glands and pancreas
appear unremarkable. No adenopathy is evident. The included portions of the
bowel appear to be within normal limits without abnormal distention or wall
thickening evident. No sclerotic or lytic bony lesions are identified. Left
mastectomy changes are present.
IMPRESSION: Status post left mastectomy. No definite metastatic disease
is appreciated. Ill-defined density in the right middle lobe anteriorly
merits follow-up. This is nonspecific in appearance. PET/CT follow-up could
be considered given the provided clinical information.

## 2012-01-27 ENCOUNTER — Emergency Department: Payer: Self-pay | Admitting: Emergency Medicine

## 2012-01-27 LAB — CBC WITH DIFFERENTIAL/PLATELET
Eosinophil %: 3.7 %
HCT: 36.6 % (ref 35.0–47.0)
Lymphocyte #: 2.2 10*3/uL (ref 1.0–3.6)
MCH: 33.5 pg (ref 26.0–34.0)
MCHC: 33.3 g/dL (ref 32.0–36.0)
Monocyte #: 0.4 x10 3/mm (ref 0.2–0.9)
Monocyte %: 7.6 %
Neutrophil %: 48.3 %
RBC: 3.63 10*6/uL — ABNORMAL LOW (ref 3.80–5.20)

## 2012-01-27 LAB — PROTIME-INR: Prothrombin Time: 13 secs (ref 11.5–14.7)

## 2012-01-27 LAB — COMPREHENSIVE METABOLIC PANEL
Chloride: 109 mmol/L — ABNORMAL HIGH (ref 98–107)
Creatinine: 0.76 mg/dL (ref 0.60–1.30)
Glucose: 89 mg/dL (ref 65–99)
Potassium: 3.3 mmol/L — ABNORMAL LOW (ref 3.5–5.1)
SGOT(AST): 17 U/L (ref 15–37)
SGPT (ALT): 15 U/L
Total Protein: 6.8 g/dL (ref 6.4–8.2)

## 2012-10-22 ENCOUNTER — Ambulatory Visit: Payer: Self-pay | Admitting: Oncology

## 2012-11-10 LAB — CBC CANCER CENTER
Basophil #: 0.1 x10 3/mm (ref 0.0–0.1)
Basophil %: 1.8 %
Eosinophil #: 0 x10 3/mm (ref 0.0–0.7)
Eosinophil %: 0.5 %
HCT: 41.8 % (ref 35.0–47.0)
HGB: 14.1 g/dL (ref 12.0–16.0)
Lymphocyte #: 2.1 x10 3/mm (ref 1.0–3.6)
MCH: 32.8 pg (ref 26.0–34.0)
Monocyte %: 8.1 %
Platelet: 233 x10 3/mm (ref 150–440)
RDW: 12.7 % (ref 11.5–14.5)
WBC: 5.6 x10 3/mm (ref 3.6–11.0)

## 2012-11-10 LAB — COMPREHENSIVE METABOLIC PANEL
Albumin: 4 g/dL (ref 3.4–5.0)
BUN: 13 mg/dL (ref 7–18)
Bilirubin,Total: 0.5 mg/dL (ref 0.2–1.0)
Calcium, Total: 9.2 mg/dL (ref 8.5–10.1)
Co2: 27 mmol/L (ref 21–32)
EGFR (African American): >60
EGFR (Non-African Amer.): >60
Glucose: 91 mg/dL (ref 65–99)
Osmolality: 275 (ref 275–301)

## 2012-11-12 LAB — CANCER ANTIGEN 27.29: CA 27.29: 17.1 U/mL (ref 0.0–38.6)

## 2012-11-15 ENCOUNTER — Ambulatory Visit: Payer: Self-pay | Admitting: Oncology

## 2012-12-24 ENCOUNTER — Ambulatory Visit: Payer: Self-pay | Admitting: Nurse Practitioner

## 2013-03-25 ENCOUNTER — Emergency Department: Payer: Self-pay | Admitting: Emergency Medicine

## 2013-03-25 LAB — URINALYSIS, COMPLETE
Bacteria: NONE SEEN
Blood: NEGATIVE
Glucose,UR: NEGATIVE mg/dL (ref 0–75)
Ketone: NEGATIVE
Leukocyte Esterase: NEGATIVE
Nitrite: NEGATIVE
Ph: 6 (ref 4.5–8.0)
Protein: NEGATIVE
RBC,UR: <1 /HPF (ref 0–5)

## 2013-03-25 LAB — COMPREHENSIVE METABOLIC PANEL
Albumin: 3.6 g/dL (ref 3.4–5.0)
Alkaline Phosphatase: 61 U/L (ref 50–136)
Anion Gap: 2 — ABNORMAL LOW (ref 7–16)
BUN: 7 mg/dL (ref 7–18)
Calcium, Total: 8.7 mg/dL (ref 8.5–10.1)
Chloride: 103 mmol/L (ref 98–107)
Co2: 30 mmol/L (ref 21–32)
Glucose: 93 mg/dL (ref 65–99)
Sodium: 135 mmol/L — ABNORMAL LOW (ref 136–145)

## 2013-03-25 LAB — CBC
MCV: 100 fL (ref 80–100)
RBC: 3.8 10*6/uL (ref 3.80–5.20)
WBC: 6.6 10*3/uL (ref 3.6–11.0)

## 2013-05-06 ENCOUNTER — Ambulatory Visit: Payer: Self-pay | Admitting: Oncology

## 2013-05-15 ENCOUNTER — Ambulatory Visit: Payer: Self-pay | Admitting: Oncology

## 2013-05-28 LAB — COMPREHENSIVE METABOLIC PANEL
Anion Gap: 10 (ref 7–16)
BUN: 19 mg/dL — ABNORMAL HIGH (ref 7–18)
Bilirubin,Total: 0.2 mg/dL (ref 0.2–1.0)
Calcium, Total: 9 mg/dL (ref 8.5–10.1)
Chloride: 102 mmol/L (ref 98–107)
Creatinine: 1.05 mg/dL (ref 0.60–1.30)
EGFR (Non-African Amer.): 58 — ABNORMAL LOW
Osmolality: 280 (ref 275–301)
SGOT(AST): 23 U/L (ref 15–37)
Total Protein: 7.1 g/dL (ref 6.4–8.2)

## 2013-05-28 LAB — CBC CANCER CENTER
Basophil %: 1.2 %
Eosinophil %: 2 %
HCT: 34.6 % — ABNORMAL LOW (ref 35.0–47.0)
HGB: 11.8 g/dL — ABNORMAL LOW (ref 12.0–16.0)
Lymphocyte #: 2.5 x10 3/mm (ref 1.0–3.6)
Lymphocyte %: 39.1 %
Monocyte #: 0.6 x10 3/mm (ref 0.2–0.9)
Monocyte %: 8.8 %
Neutrophil #: 3.2 x10 3/mm (ref 1.4–6.5)
Neutrophil %: 48.9 %
Platelet: 222 x10 3/mm (ref 150–440)
RBC: 3.44 10*6/uL — ABNORMAL LOW (ref 3.80–5.20)
WBC: 6.5 x10 3/mm (ref 3.6–11.0)

## 2013-06-13 IMAGING — US US EXTREM UP VENOUS*L*
1 series · 17 of 24 positions shown · non-contrast
Comparison: none

REASON FOR EXAM: left arm pain and swelling, h/o left breast cancer
COMMENTS:

PROCEDURE:     US  - US DOPPLER UP EXTR LEFT  - January 27, 2012  [DATE]
RESULT:

[Series 1: us extrem up venous*left* · 17 of 33 slices shown]
[im 1/33]
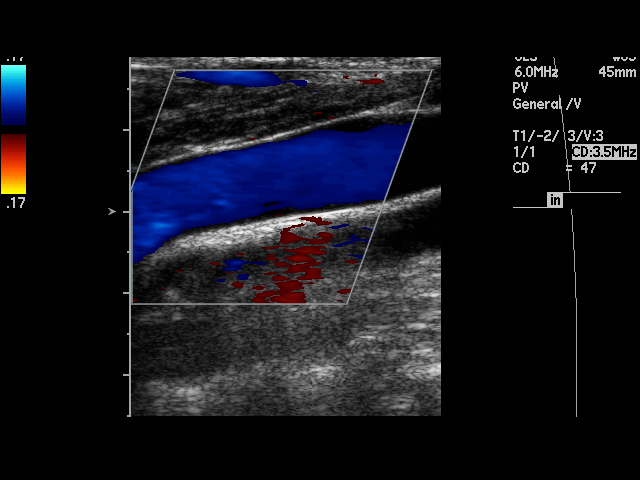
[im 3/33]
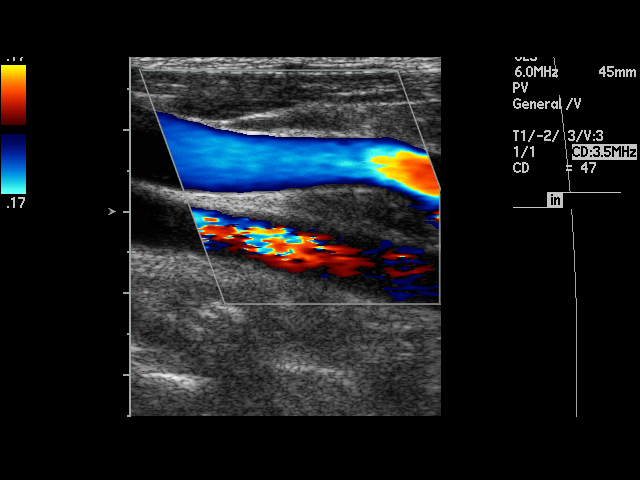
[im 5/33]
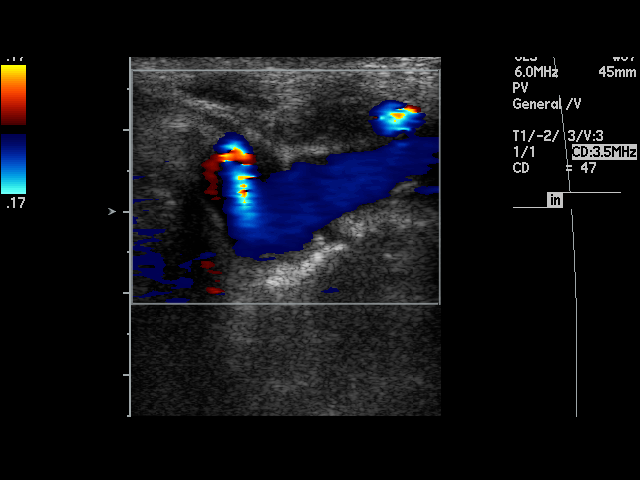
[im 6/33]
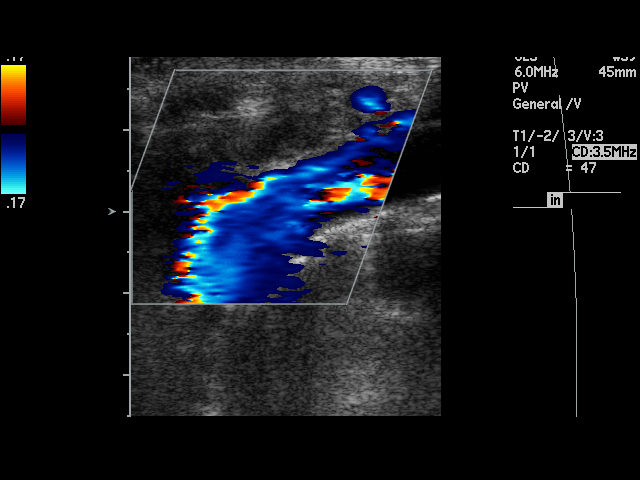
[im 9/33]
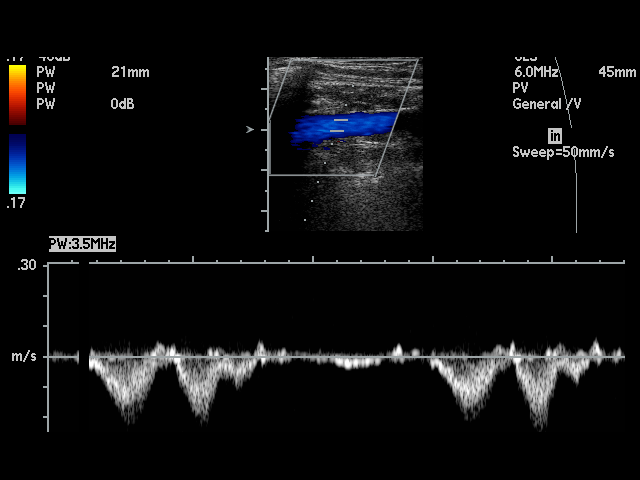
[im 10/33]
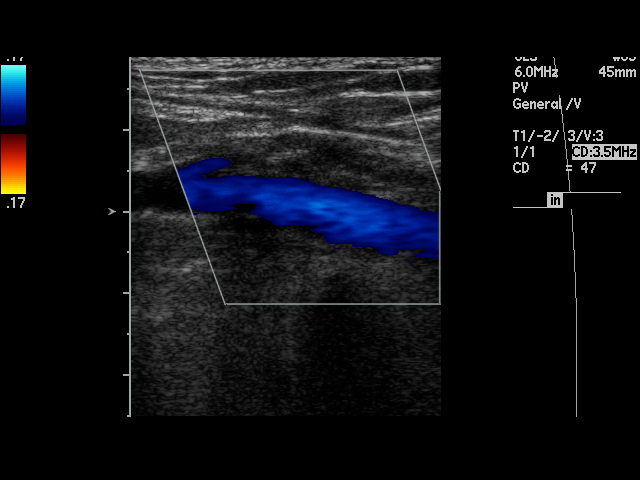
[im 13/33]
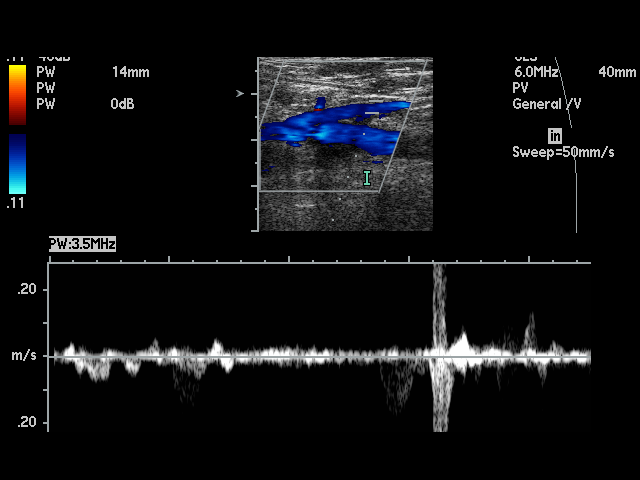
[im 14/33]
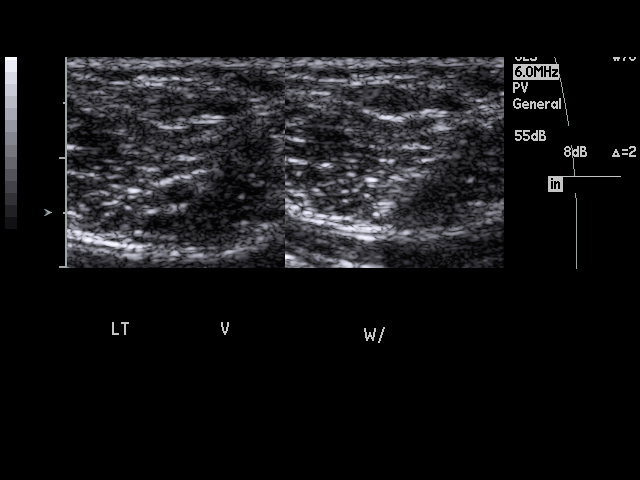
[im 17/33]
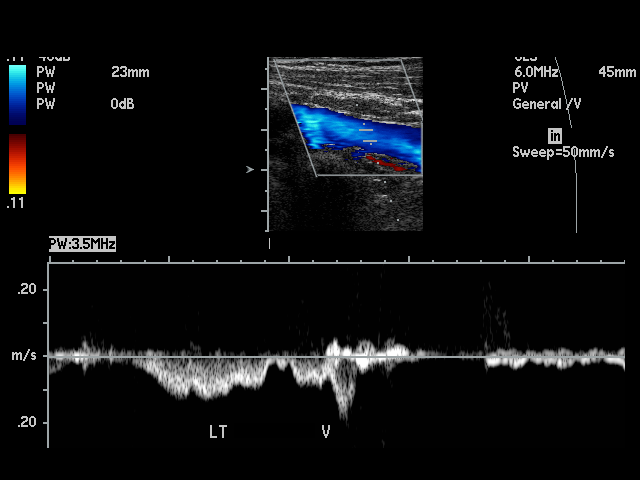
[im 19/33]
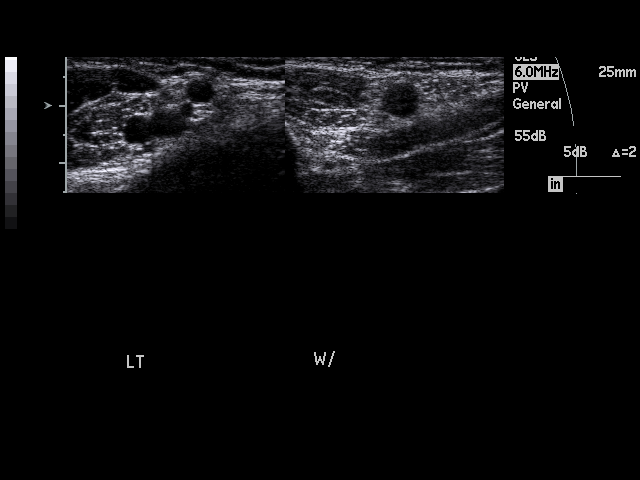
[im 20/33]
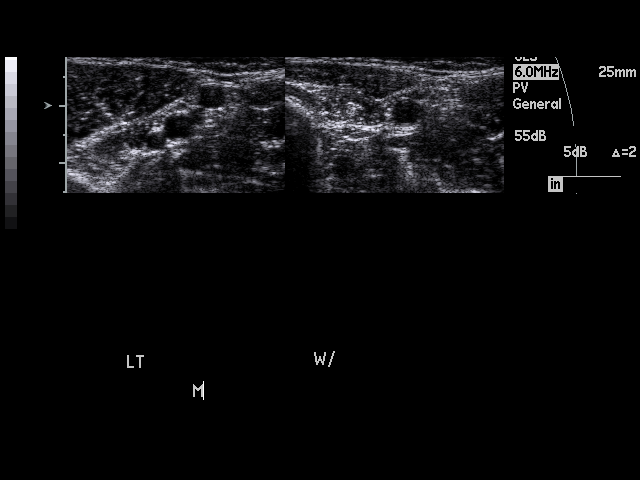
[im 23/33]
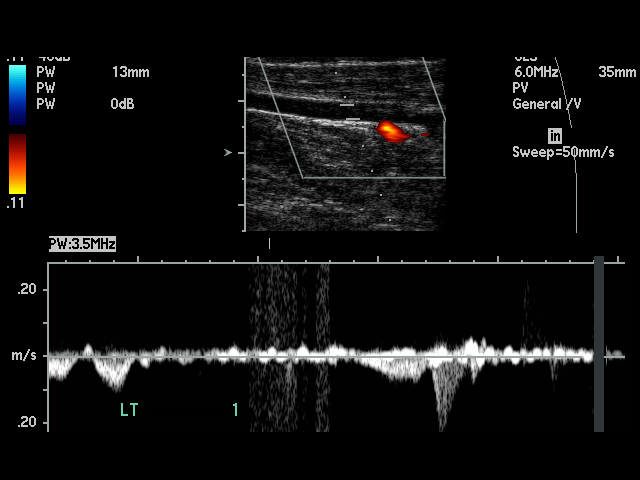
[im 24/33]
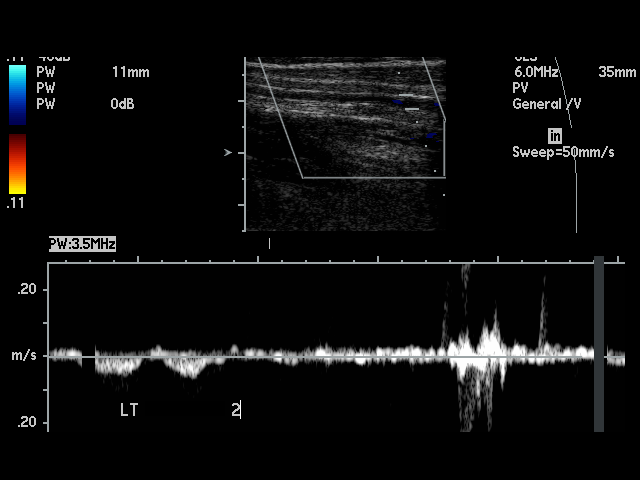
[im 27/33]
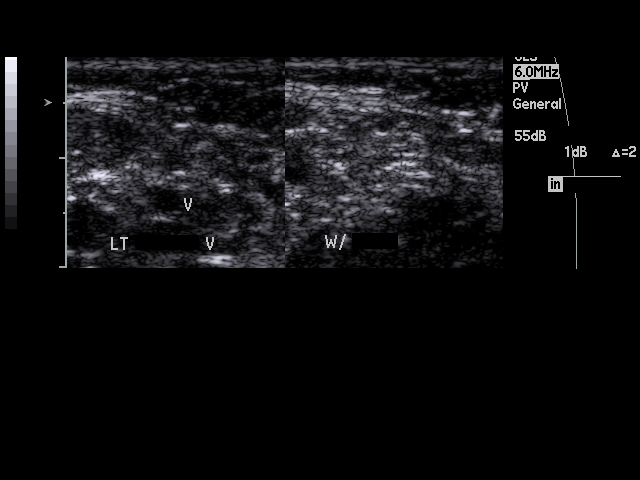
[im 28/33]
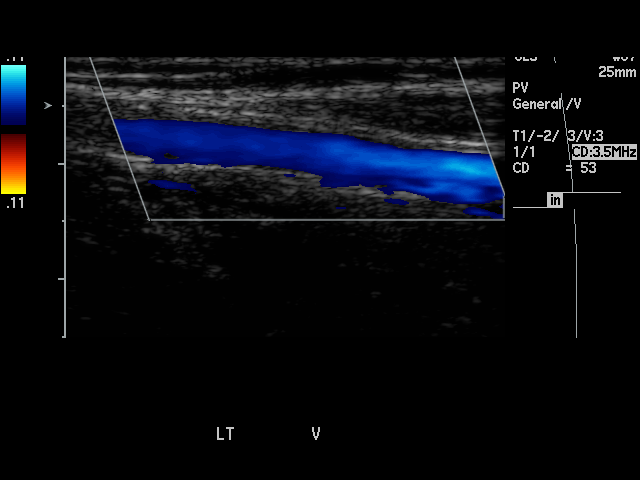
[im 30/33]
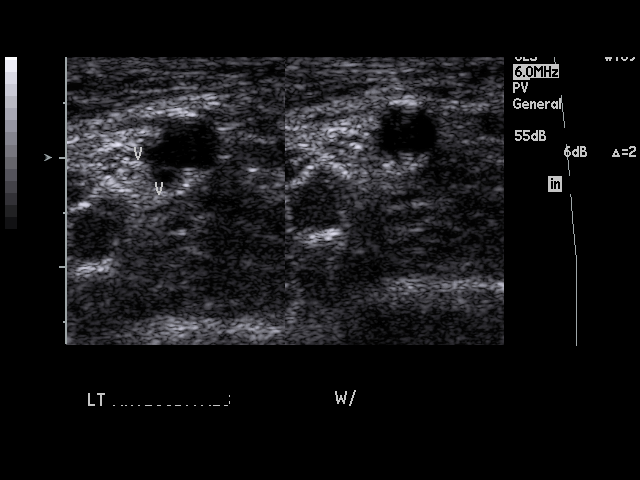
[im 33/33]
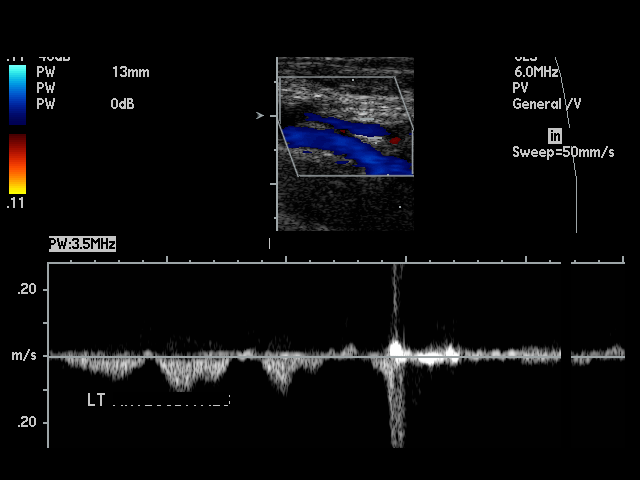

[17 of 24 positions shown; findings below may reference images not displayed]

FINDINGS: There is normal compression, flow and augmentation of the deep
venous structures of the left upper extremity (axillary, cephalic, basilic
and brachial veins). Normal color flow Doppler waveforms are demonstrated
within the interrogated ipsilateral jugular and subclavian veins.
IMPRESSION: 1. No evidence of deep venous thrombus within the interrogated deep venous
structures of the left upper extremity.
2. Dr. Shemario of the Emergency Department was informed of these findings via
a preliminary faxed report.

## 2013-06-13 IMAGING — CT CT HEAD WITHOUT CONTRAST
2 series · 16 of 30 positions shown, 20 images · non-contrast
Comparison: none

REASON FOR EXAM: left arm weakness,left facial numbness
COMMENTS:

PROCEDURE:     CT  - CT HEAD WITHOUT CONTRAST  - January 27, 2012  [DATE]
RESULT:     Technique: Helical 5mm sections were obtained from the skull
base to the vertex without administration of intravenous contrast.

[Series 2: without · axial · non-contrast · 0.42mm/px · z∈[-78,+42]mm · 13 of 30 slices shown, 17 images]
[im 3/30  brain]
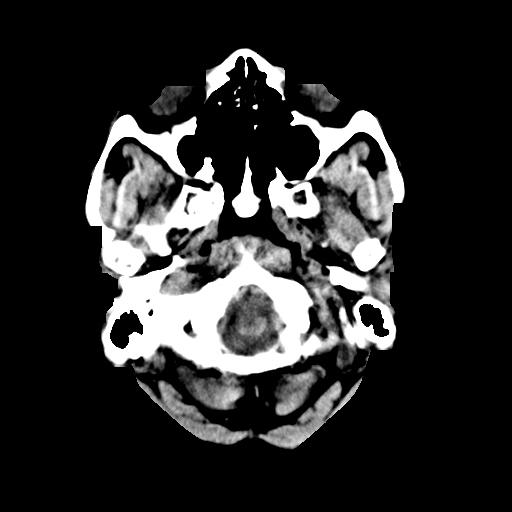
[im 3/30  bone]
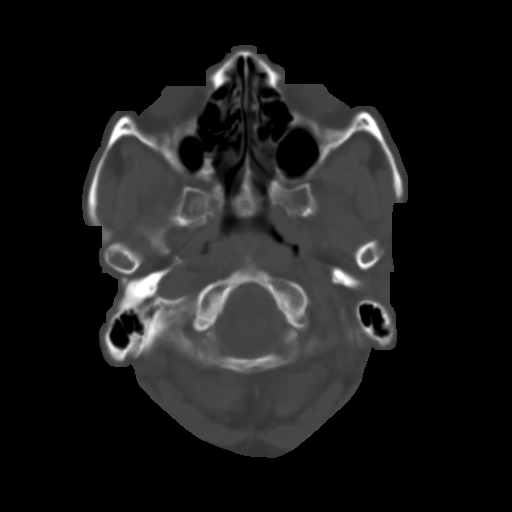
[im 5/30  brain]
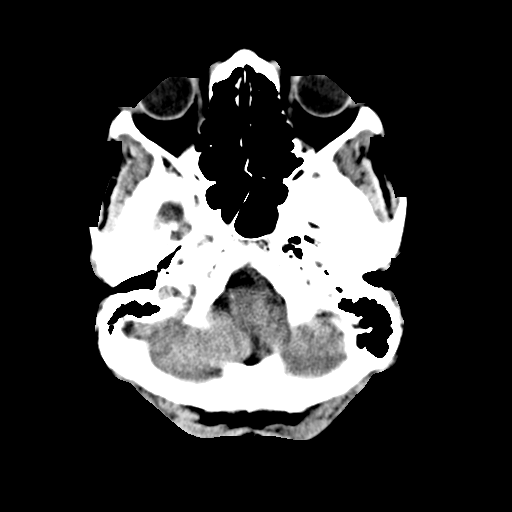
[im 7/30  brain]
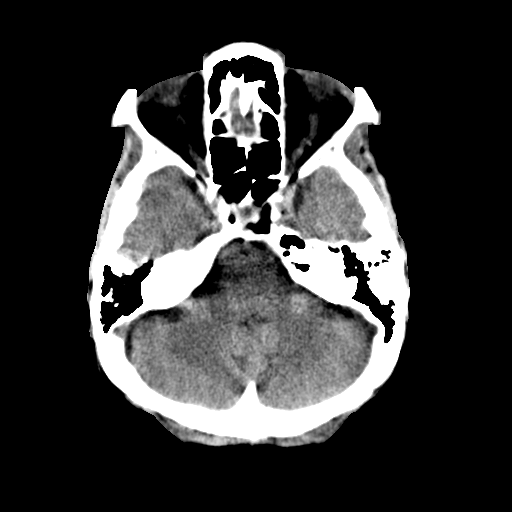
[im 9/30  brain]
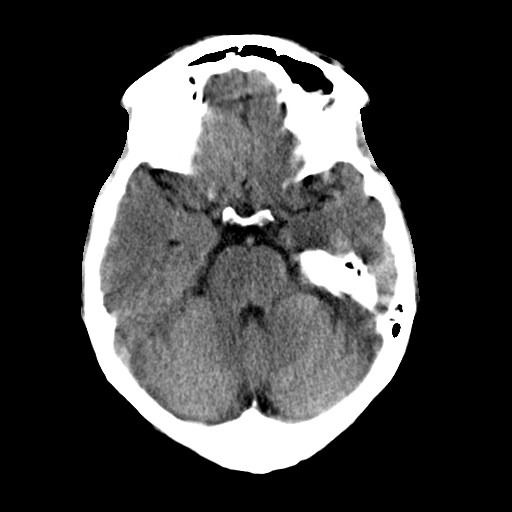
[im 11/30  brain]
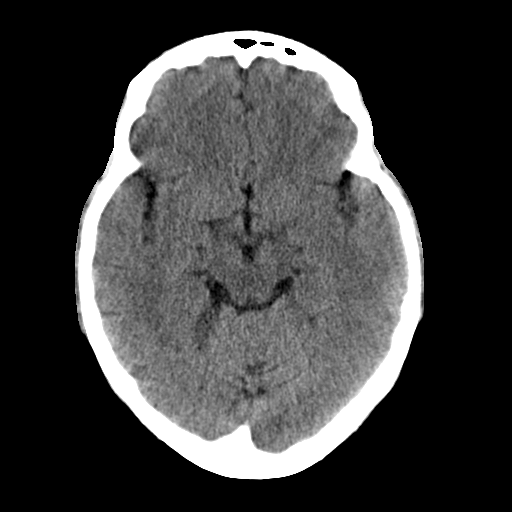
[im 11/30  bone]
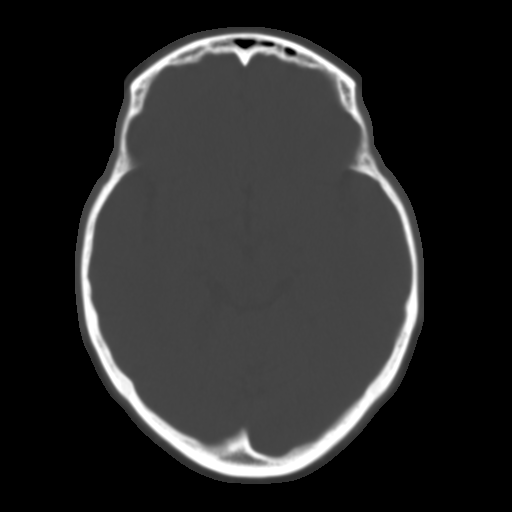
[im 13/30  brain]
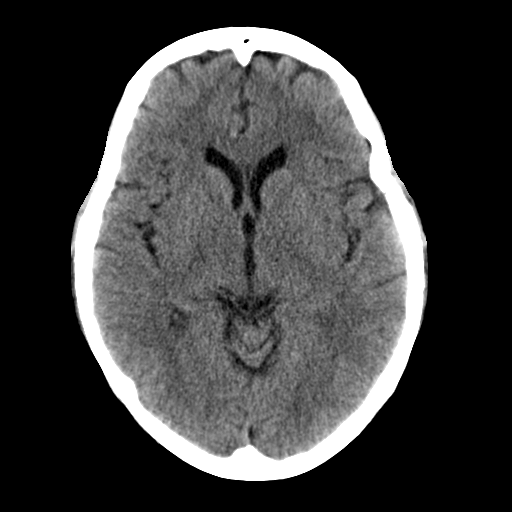
[im 15/30  brain]
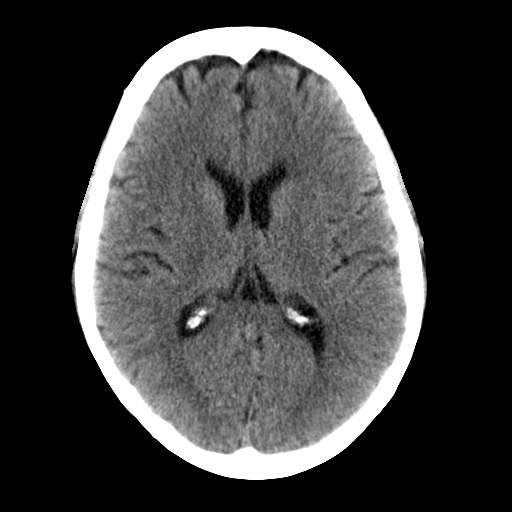
[im 17/30  brain]
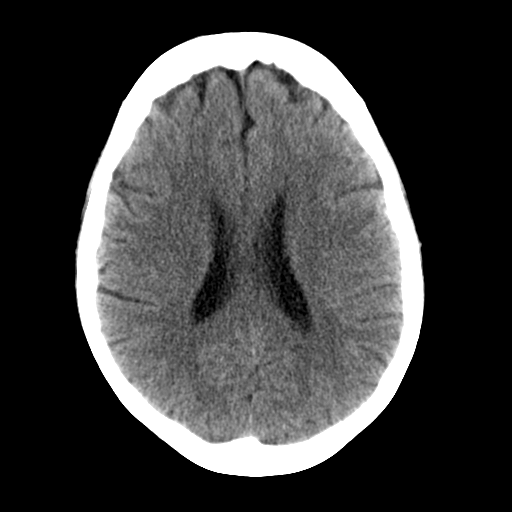
[im 19/30  brain]
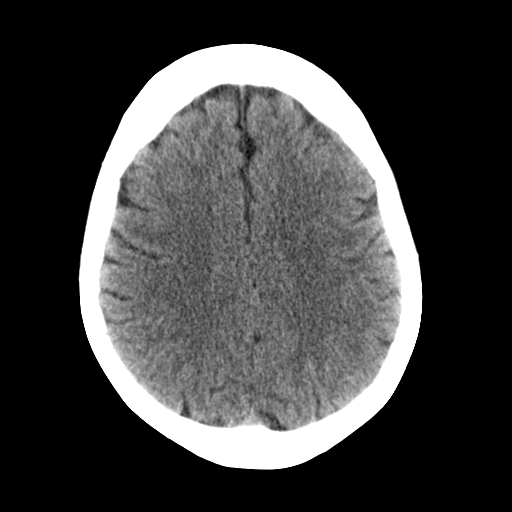
[im 19/30  bone]
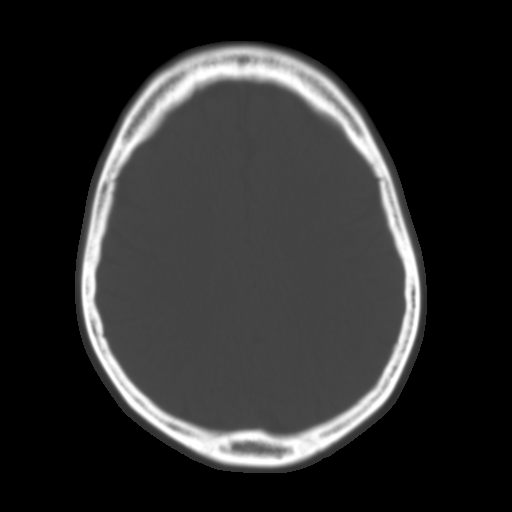
[im 21/30  brain]
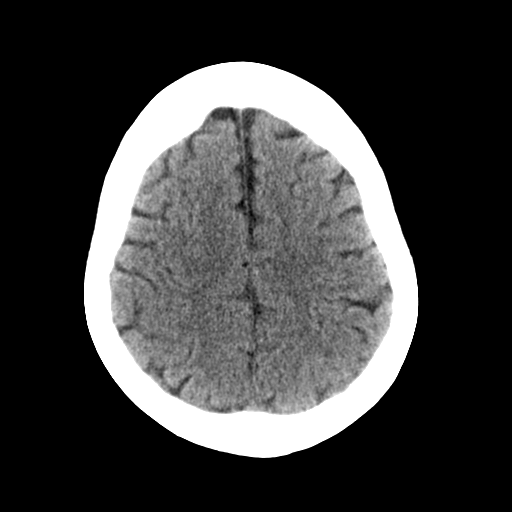
[im 23/30  brain]
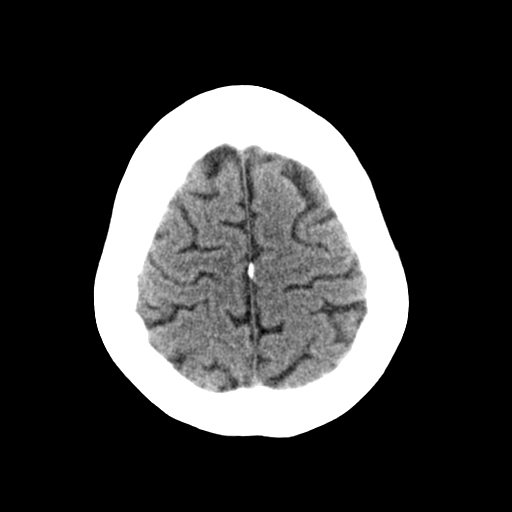
[im 25/30  brain]
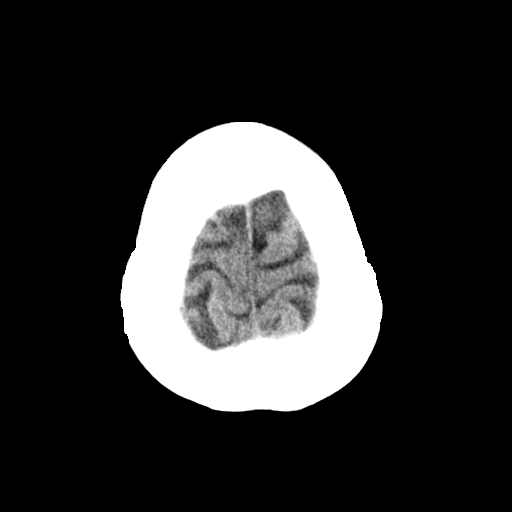
[im 27/30  brain]
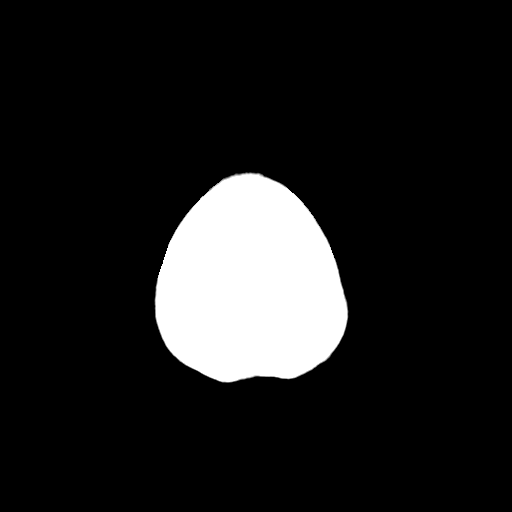
[im 27/30  bone]
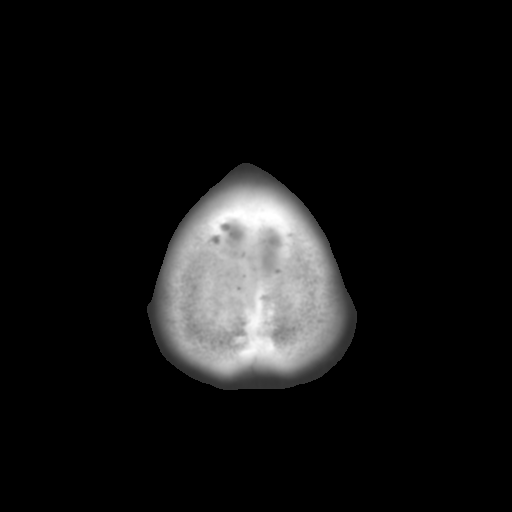

[Series 3: bone · axial · 0.42mm/px · z∈[-78,-38]mm · 3 of 30 slices shown]
[im 3/30  bone]
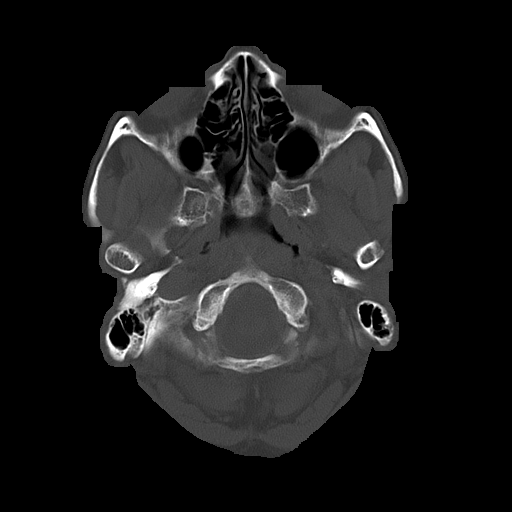
[im 7/30  bone]
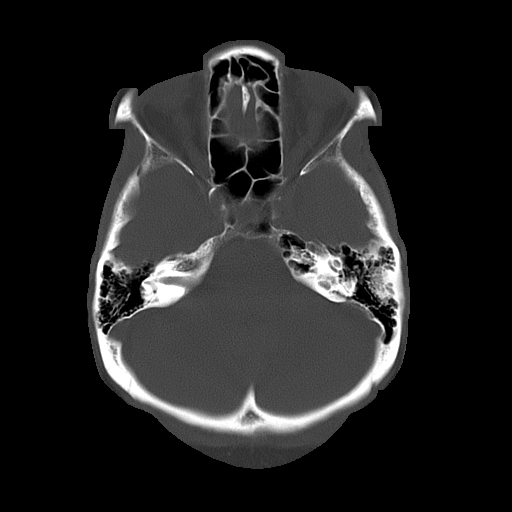
[im 11/30  bone]
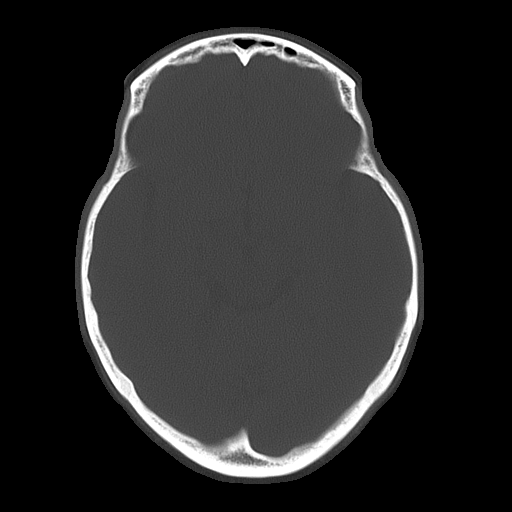

[16 of 30 positions shown; findings below may reference images not displayed]

FINDINGS: There is not evidence of intra-axial fluid collections. There is
no evidence of acute hemorrhage or secondary signs reflecting mass effect or
subacute or chronic focal territorial infarction. The osseous structures
demonstrate no evidence of a depressed skull fracture. If there is
persistent concern clinical follow-up with MRI is recommended.
IMPRESSION: 1. No evidence of acute intracranial abnormalitites.
2. Dr. Bambucafe at the emergency department was informed of these findings via
a preliminary faxed report.

## 2013-06-15 ENCOUNTER — Ambulatory Visit: Payer: Self-pay | Admitting: Oncology

## 2013-06-26 ENCOUNTER — Inpatient Hospital Stay: Payer: Self-pay | Admitting: Internal Medicine

## 2013-06-26 LAB — COMPREHENSIVE METABOLIC PANEL
Albumin: 3.5 g/dL (ref 3.4–5.0)
Alkaline Phosphatase: 98 U/L (ref 50–136)
Anion Gap: 4 — ABNORMAL LOW (ref 7–16)
Bilirubin,Total: 0.2 mg/dL (ref 0.2–1.0)
Calcium, Total: 8.9 mg/dL (ref 8.5–10.1)
Chloride: 108 mmol/L — ABNORMAL HIGH (ref 98–107)
Creatinine: 0.84 mg/dL (ref 0.60–1.30)
EGFR (African American): >60
EGFR (Non-African Amer.): >60
Glucose: 89 mg/dL (ref 65–99)
Osmolality: 275 (ref 275–301)
Potassium: 3.1 mmol/L — ABNORMAL LOW (ref 3.5–5.1)
SGOT(AST): 36 U/L (ref 15–37)
SGPT (ALT): 18 U/L (ref 12–78)

## 2013-06-26 LAB — CK TOTAL AND CKMB (NOT AT ARMC)
CK, Total: 207 U/L (ref 21–215)
CK-MB: 2.8 ng/mL (ref 0.5–3.6)

## 2013-06-26 LAB — CBC
HCT: 36.8 % (ref 35.0–47.0)
HGB: 12.3 g/dL (ref 12.0–16.0)
MCH: 33.2 pg (ref 26.0–34.0)
MCV: 99 fL (ref 80–100)
RDW: 13.3 % (ref 11.5–14.5)

## 2013-06-27 LAB — CBC WITH DIFFERENTIAL/PLATELET
Basophil #: 0.1 10*3/uL (ref 0.0–0.1)
Basophil %: 0.6 %
Eosinophil %: 0 %
HGB: 12.9 g/dL (ref 12.0–16.0)
Lymphocyte %: 8 %
MCH: 33.4 pg (ref 26.0–34.0)
MCHC: 33.3 g/dL (ref 32.0–36.0)
MCV: 101 fL — ABNORMAL HIGH (ref 80–100)
Monocyte #: 0.1 x10 3/mm — ABNORMAL LOW (ref 0.2–0.9)
Monocyte %: 0.8 %
Neutrophil #: 8.8 10*3/uL — ABNORMAL HIGH (ref 1.4–6.5)
RBC: 3.87 10*6/uL (ref 3.80–5.20)
RDW: 13.1 % (ref 11.5–14.5)
WBC: 9.7 10*3/uL (ref 3.6–11.0)

## 2013-06-27 LAB — TROPONIN I
Troponin-I: <0.02 ng/mL
Troponin-I: <0.02 ng/mL

## 2013-06-27 LAB — COMPREHENSIVE METABOLIC PANEL
Albumin: 3.6 g/dL (ref 3.4–5.0)
Alkaline Phosphatase: 101 U/L (ref 50–136)
Anion Gap: 5 — ABNORMAL LOW (ref 7–16)
BUN: 13 mg/dL (ref 7–18)
Bilirubin,Total: 0.3 mg/dL (ref 0.2–1.0)
Chloride: 104 mmol/L (ref 98–107)
Co2: 31 mmol/L (ref 21–32)
EGFR (Non-African Amer.): >60
Glucose: 123 mg/dL — ABNORMAL HIGH (ref 65–99)
Osmolality: 281 (ref 275–301)
SGPT (ALT): 17 U/L (ref 12–78)
Total Protein: 8.1 g/dL (ref 6.4–8.2)

## 2013-06-27 LAB — CK-MB: CK-MB: 1.9 ng/mL (ref 0.5–3.6)

## 2013-06-28 LAB — BASIC METABOLIC PANEL
Anion Gap: 3 — ABNORMAL LOW (ref 7–16)
BUN: 20 mg/dL — ABNORMAL HIGH (ref 7–18)
Calcium, Total: 9.2 mg/dL (ref 8.5–10.1)
Chloride: 100 mmol/L (ref 98–107)
Co2: 34 mmol/L — ABNORMAL HIGH (ref 21–32)
EGFR (African American): >60
EGFR (Non-African Amer.): >60
Glucose: 130 mg/dL — ABNORMAL HIGH (ref 65–99)
Osmolality: 278 (ref 275–301)
Potassium: 3.8 mmol/L (ref 3.5–5.1)
Sodium: 137 mmol/L (ref 136–145)

## 2013-07-01 LAB — CULTURE, BLOOD (SINGLE)

## 2013-07-04 LAB — EXPECTORATED SPUTUM ASSESSMENT W GRAM STAIN, RFLX TO RESP C

## 2013-12-06 LAB — COMPREHENSIVE METABOLIC PANEL
ALT: 16 U/L (ref 12–78)
Albumin: 3.8 g/dL (ref 3.4–5.0)
Alkaline Phosphatase: 110 U/L
Anion Gap: 6 — ABNORMAL LOW (ref 7–16)
BILIRUBIN TOTAL: 0.3 mg/dL (ref 0.2–1.0)
BUN: 18 mg/dL (ref 7–18)
CHLORIDE: 109 mmol/L — AB (ref 98–107)
Calcium, Total: 8.5 mg/dL (ref 8.5–10.1)
Co2: 25 mmol/L (ref 21–32)
Creatinine: 0.94 mg/dL (ref 0.60–1.30)
EGFR (Non-African Amer.): >60
GLUCOSE: 99 mg/dL (ref 65–99)
OSMOLALITY: 281 (ref 275–301)
Potassium: 2.9 mmol/L — ABNORMAL LOW (ref 3.5–5.1)
SGOT(AST): 27 U/L (ref 15–37)
SODIUM: 140 mmol/L (ref 136–145)
TOTAL PROTEIN: 7.5 g/dL (ref 6.4–8.2)

## 2013-12-06 LAB — URINALYSIS, COMPLETE
Bacteria: NONE SEEN
Bilirubin,UR: NEGATIVE
Blood: NEGATIVE
GLUCOSE, UR: NEGATIVE mg/dL (ref 0–75)
Ketone: NEGATIVE
LEUKOCYTE ESTERASE: NEGATIVE
Nitrite: NEGATIVE
PH: 5 (ref 4.5–8.0)
Protein: NEGATIVE
RBC,UR: 1 /HPF (ref 0–5)
SPECIFIC GRAVITY: 1.015 (ref 1.003–1.030)
WBC UR: 1 /HPF (ref 0–5)

## 2013-12-06 LAB — CBC WITH DIFFERENTIAL/PLATELET
BASOS PCT: 0.4 %
Basophil #: 0 10*3/uL (ref 0.0–0.1)
Eosinophil #: 0.1 10*3/uL (ref 0.0–0.7)
Eosinophil %: 1.7 %
HCT: 35.5 % (ref 35.0–47.0)
HGB: 11.6 g/dL — AB (ref 12.0–16.0)
LYMPHS ABS: 0.8 10*3/uL — AB (ref 1.0–3.6)
Lymphocyte %: 9.1 %
MCH: 30.7 pg (ref 26.0–34.0)
MCHC: 32.6 g/dL (ref 32.0–36.0)
MCV: 94 fL (ref 80–100)
MONOS PCT: 2.9 %
Monocyte #: 0.2 x10 3/mm (ref 0.2–0.9)
Neutrophil #: 7.4 10*3/uL — ABNORMAL HIGH (ref 1.4–6.5)
Neutrophil %: 85.9 %
PLATELETS: 248 10*3/uL (ref 150–440)
RBC: 3.77 10*6/uL — AB (ref 3.80–5.20)
RDW: 13 % (ref 11.5–14.5)
WBC: 8.6 10*3/uL (ref 3.6–11.0)

## 2013-12-07 ENCOUNTER — Inpatient Hospital Stay: Payer: Self-pay | Admitting: Internal Medicine

## 2013-12-07 LAB — MAGNESIUM: Magnesium: 1.8 mg/dL

## 2013-12-08 LAB — CBC WITH DIFFERENTIAL/PLATELET
BASOS ABS: 0.1 10*3/uL (ref 0.0–0.1)
Basophil %: 0.9 %
EOS PCT: 3.7 %
Eosinophil #: 0.4 10*3/uL (ref 0.0–0.7)
HCT: 31 % — AB (ref 35.0–47.0)
HGB: 10 g/dL — AB (ref 12.0–16.0)
Lymphocyte #: 1.3 10*3/uL (ref 1.0–3.6)
Lymphocyte %: 12.7 %
MCH: 30.7 pg (ref 26.0–34.0)
MCHC: 32.2 g/dL (ref 32.0–36.0)
MCV: 95 fL (ref 80–100)
MONO ABS: 0.6 x10 3/mm (ref 0.2–0.9)
Monocyte %: 5.9 %
Neutrophil #: 7.6 10*3/uL — ABNORMAL HIGH (ref 1.4–6.5)
Neutrophil %: 76.8 %
PLATELETS: 198 10*3/uL (ref 150–440)
RBC: 3.24 10*6/uL — ABNORMAL LOW (ref 3.80–5.20)
RDW: 13.5 % (ref 11.5–14.5)
WBC: 9.8 10*3/uL (ref 3.6–11.0)

## 2013-12-08 LAB — BASIC METABOLIC PANEL
Anion Gap: 5 — ABNORMAL LOW (ref 7–16)
BUN: 14 mg/dL (ref 7–18)
CALCIUM: 8.5 mg/dL (ref 8.5–10.1)
Chloride: 104 mmol/L (ref 98–107)
Co2: 27 mmol/L (ref 21–32)
Creatinine: 0.98 mg/dL (ref 0.60–1.30)
EGFR (African American): >60
Glucose: 133 mg/dL — ABNORMAL HIGH (ref 65–99)
OSMOLALITY: 274 (ref 275–301)
Potassium: 3.5 mmol/L (ref 3.5–5.1)
Sodium: 136 mmol/L (ref 136–145)

## 2013-12-08 LAB — URINE CULTURE

## 2013-12-09 LAB — VANCOMYCIN, TROUGH: VANCOMYCIN, TROUGH: 5 ug/mL — AB (ref 10–20)

## 2013-12-09 LAB — HEMOGLOBIN: HGB: 10.3 g/dL — AB (ref 12.0–16.0)

## 2013-12-10 LAB — CREATININE, SERUM
CREATININE: 0.96 mg/dL (ref 0.60–1.30)
EGFR (Non-African Amer.): >60

## 2013-12-10 LAB — VANCOMYCIN, TROUGH: VANCOMYCIN, TROUGH: 18 ug/mL (ref 10–20)

## 2013-12-11 LAB — CULTURE, BLOOD (SINGLE)

## 2013-12-12 LAB — CULTURE, BLOOD (SINGLE)

## 2013-12-14 LAB — CULTURE, BLOOD (SINGLE)

## 2014-03-02 ENCOUNTER — Ambulatory Visit: Payer: Self-pay | Admitting: Family Medicine

## 2014-05-11 IMAGING — MG MM MAMMO DIAGNOSTIC UNILATERAL*R*
1 series · 4 of 4 positions shown · non-contrast
Comparison: none

REASON FOR EXAM: HX BR CA LT MAST
COMMENTS:

PROCEDURE:     MAM - MAM DGTL UNI MAM RT BREAST W/CAD  - December 24, 2012 [DATE]
RESULT:     Comparison made to prior study dated 12/08/2003. Nodular
parenchymal pattern is present with benign calcification. No mass. CAD
evaluation nonfocal.

[R CC · right · 4 of 4 slices shown]
[im 1/4]
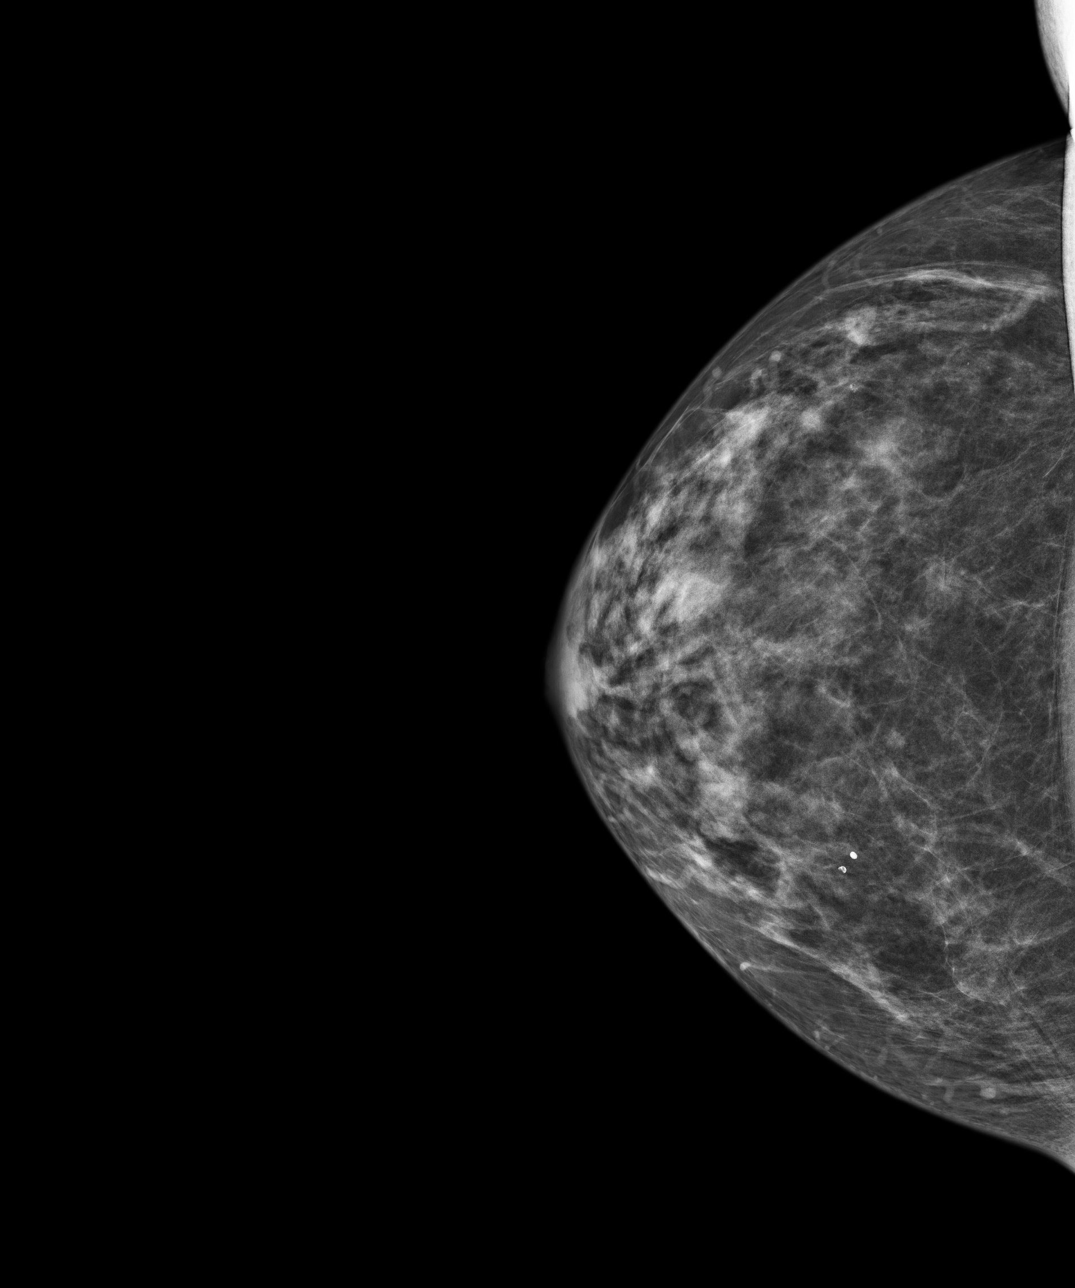
[im 2/4]
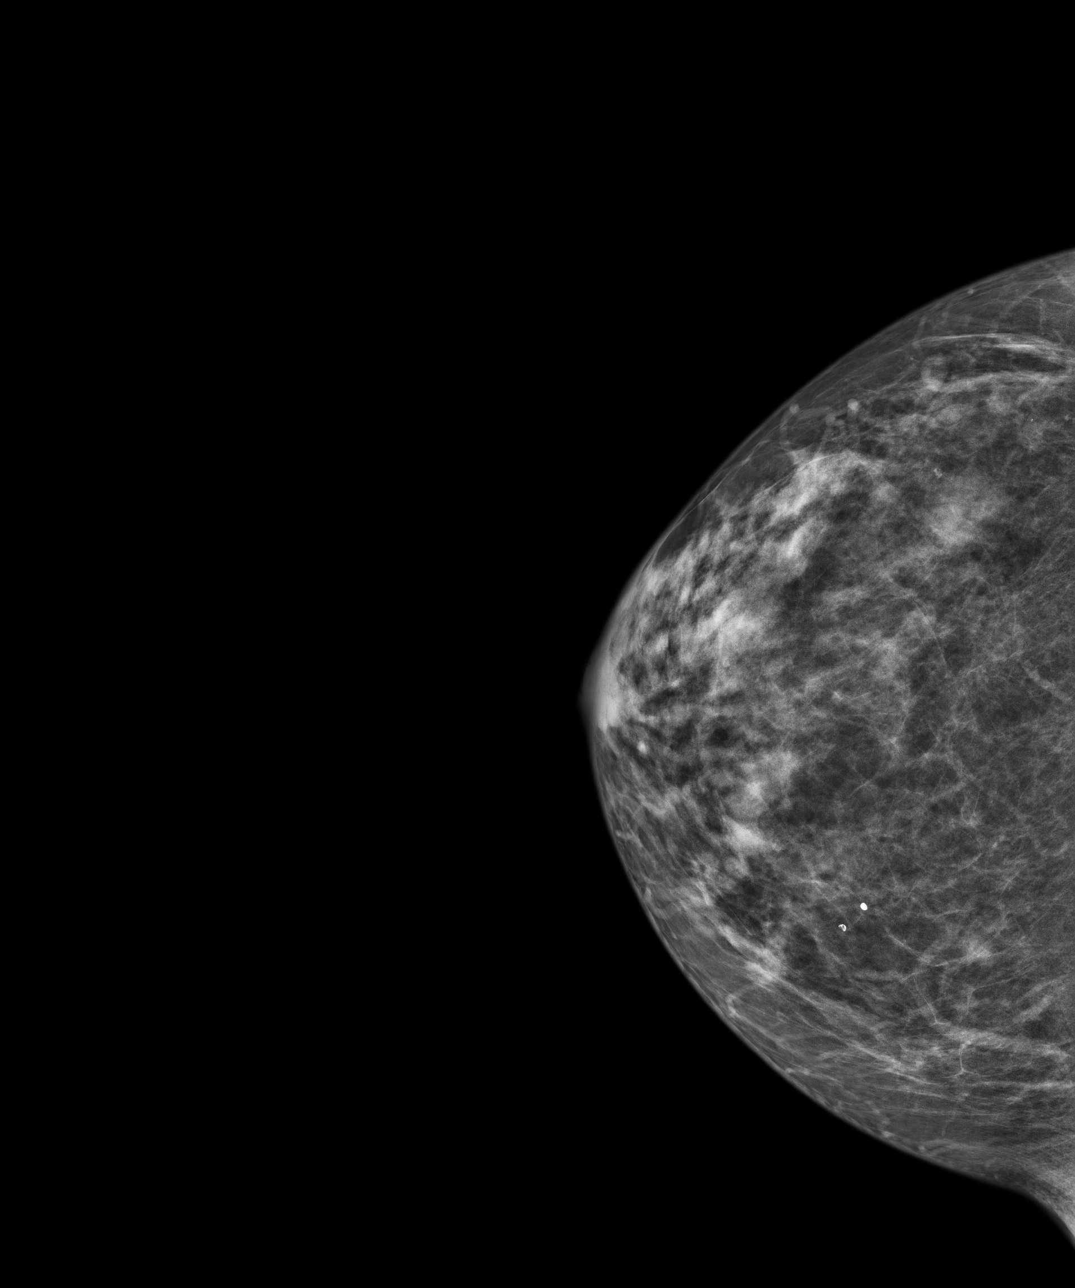
[im 3/4]
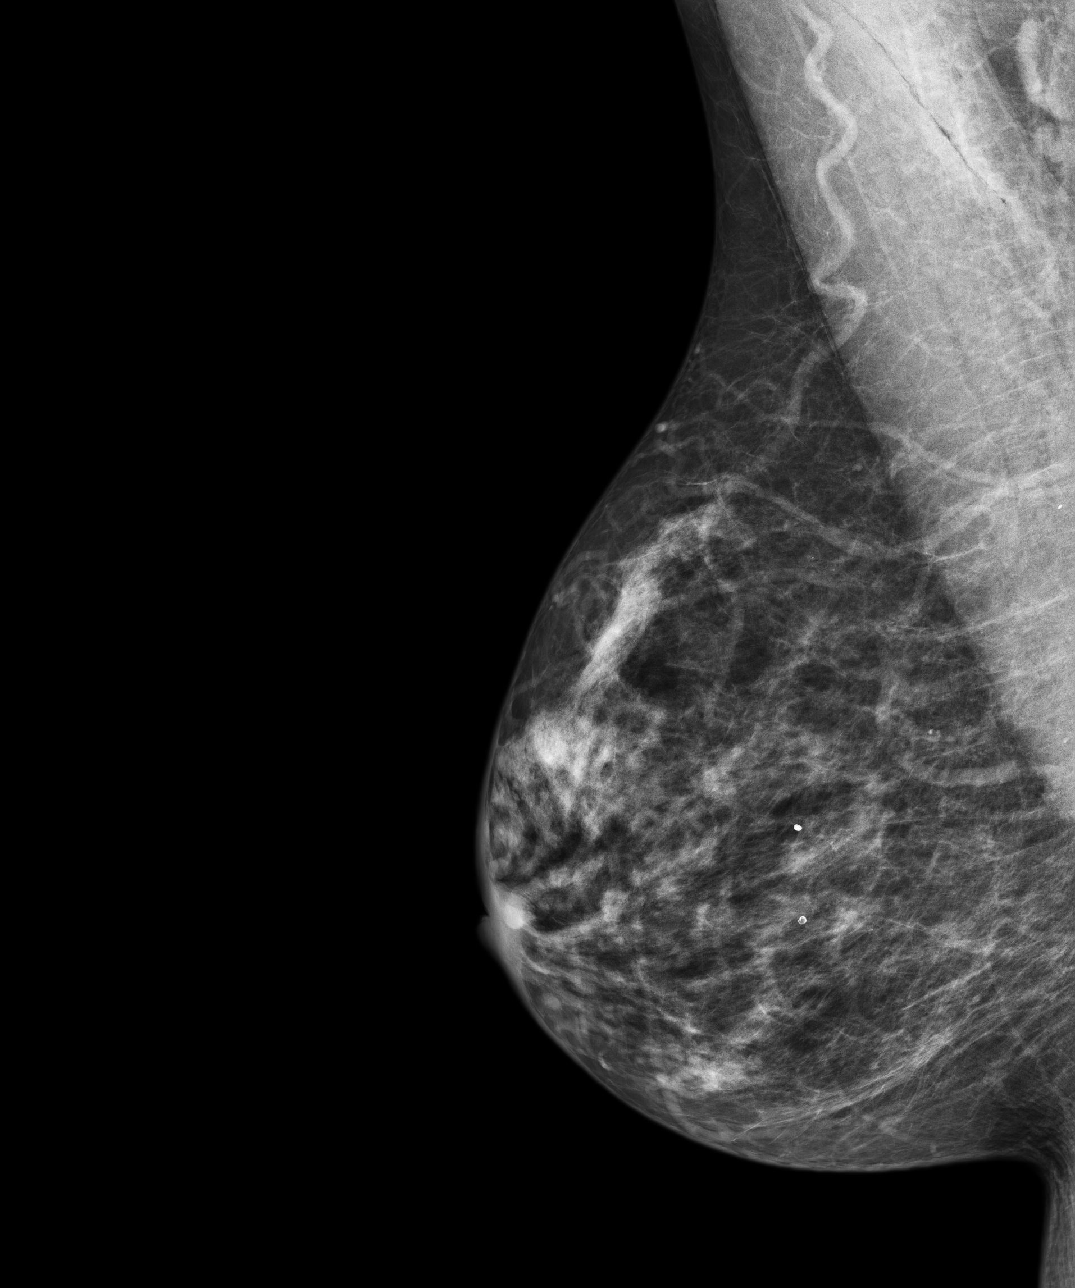
[im 4/4]
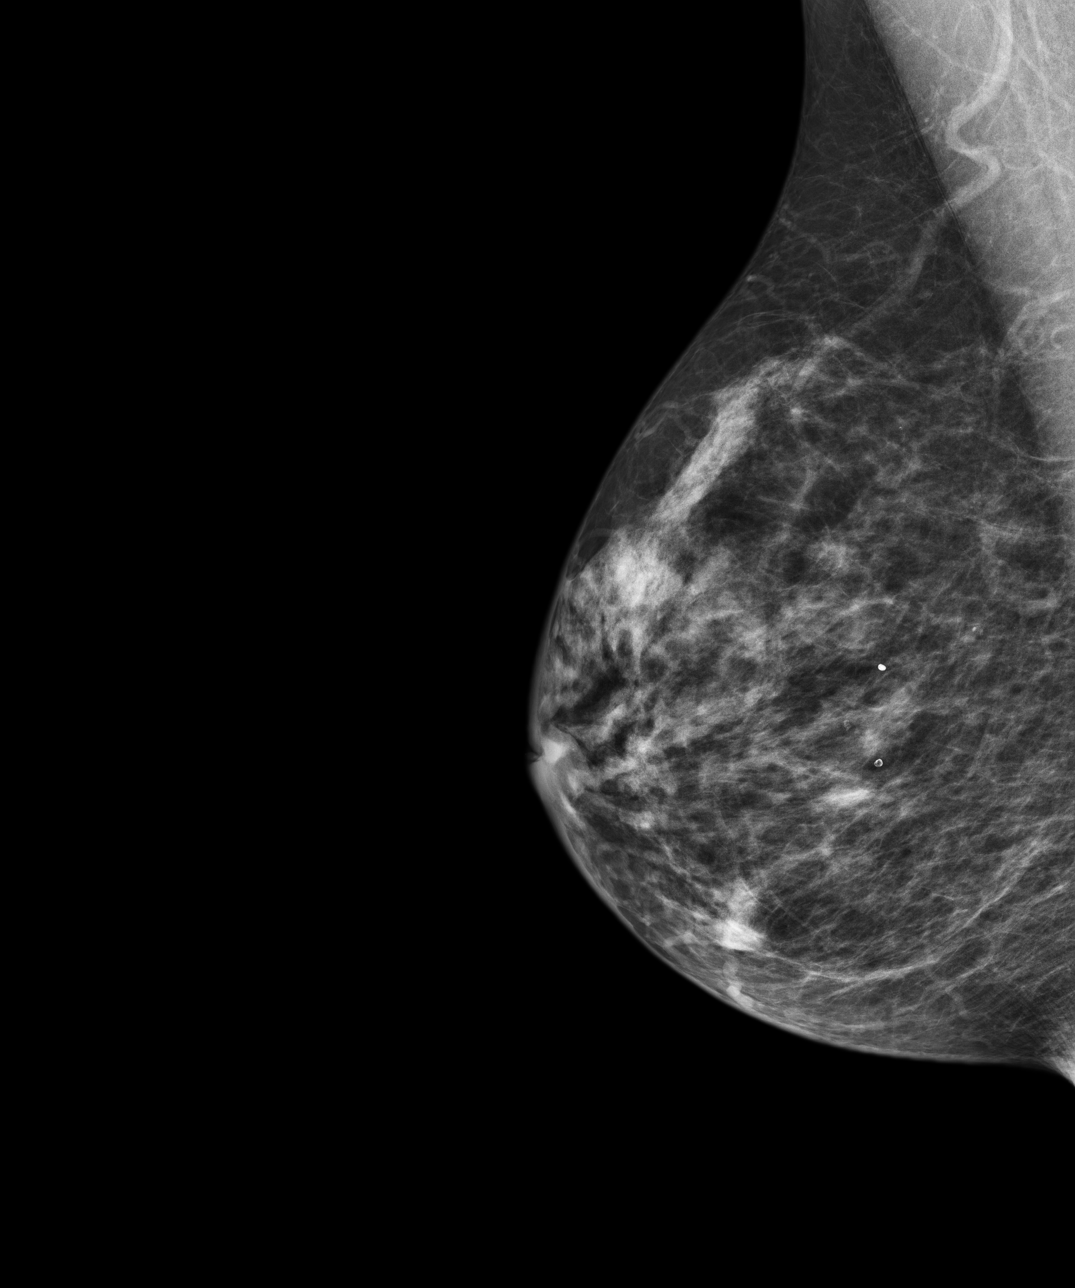

[4 of 4 positions shown; findings below may reference images not displayed]

IMPRESSION: Benign exam.

BI-RADS: Category 2- Benign Finding

A NEGATIVE MAMMOGRAM REPORT DOES NOT PRECLUDE BIOPSY OR OTHER EVALUATION OF
A CLINICALLY PALPABLE OR OTHERWISE SUSPICIOUS MASS OR LESION. BREAST CANCER
MAY NOT BE DETECTED IN UP TO 10% OF CASES.

Thank you for the oppurtunity to contribute to the care of your patient. .

BREAST COMPOSITION: The breast composition is HETEROGENEOUSLY DENSE
(glandular tissue is 51-75%) This may decrease the sensitivity of
mammography.

## 2014-06-01 ENCOUNTER — Ambulatory Visit: Payer: Self-pay | Admitting: Oncology

## 2014-06-09 LAB — CBC CANCER CENTER
BASOS ABS: 0.1 x10 3/mm (ref 0.0–0.1)
BASOS PCT: 2.3 %
EOS ABS: 0.5 x10 3/mm (ref 0.0–0.7)
Eosinophil %: 10.1 %
HCT: 35.5 % (ref 35.0–47.0)
HGB: 11.3 g/dL — ABNORMAL LOW (ref 12.0–16.0)
Lymphocyte #: 1.8 x10 3/mm (ref 1.0–3.6)
Lymphocyte %: 34.4 %
MCH: 29.1 pg (ref 26.0–34.0)
MCHC: 31.8 g/dL — AB (ref 32.0–36.0)
MCV: 91 fL (ref 80–100)
Monocyte #: 0.5 x10 3/mm (ref 0.2–0.9)
Monocyte %: 9 %
NEUTROS ABS: 2.4 x10 3/mm (ref 1.4–6.5)
Neutrophil %: 44.2 %
Platelet: 277 x10 3/mm (ref 150–440)
RBC: 3.89 10*6/uL (ref 3.80–5.20)
RDW: 15 % — ABNORMAL HIGH (ref 11.5–14.5)
WBC: 5.3 x10 3/mm (ref 3.6–11.0)

## 2014-06-09 LAB — COMPREHENSIVE METABOLIC PANEL
ALBUMIN: 3.6 g/dL (ref 3.4–5.0)
ALK PHOS: 92 U/L
AST: 22 U/L (ref 15–37)
Anion Gap: 7 (ref 7–16)
BUN: 22 mg/dL — ABNORMAL HIGH (ref 7–18)
Bilirubin,Total: 0.3 mg/dL (ref 0.2–1.0)
CHLORIDE: 103 mmol/L (ref 98–107)
CO2: 32 mmol/L (ref 21–32)
Calcium, Total: 8.9 mg/dL (ref 8.5–10.1)
Creatinine: 1.12 mg/dL (ref 0.60–1.30)
EGFR (African American): >60
GFR CALC NON AF AMER: 53 — AB
Glucose: 100 mg/dL — ABNORMAL HIGH (ref 65–99)
Osmolality: 287 (ref 275–301)
POTASSIUM: 4 mmol/L (ref 3.5–5.1)
SGPT (ALT): 24 U/L
Sodium: 142 mmol/L (ref 136–145)
Total Protein: 7.7 g/dL (ref 6.4–8.2)

## 2014-06-15 ENCOUNTER — Ambulatory Visit: Payer: Self-pay | Admitting: Oncology

## 2014-08-11 IMAGING — US ABDOMEN ULTRASOUND LIMITED
1 series · 14 of 25 positions shown · non-contrast
Comparison: none

REASON FOR EXAM: RUQ/epigastric pain
COMMENTS:   Body Site: GB and Fossa, CBD, Head of Pancreas

[Series 1: abdomen ultrasound limited · 0.26mm/px · 14 of 42 slices shown]
[im 1/42]
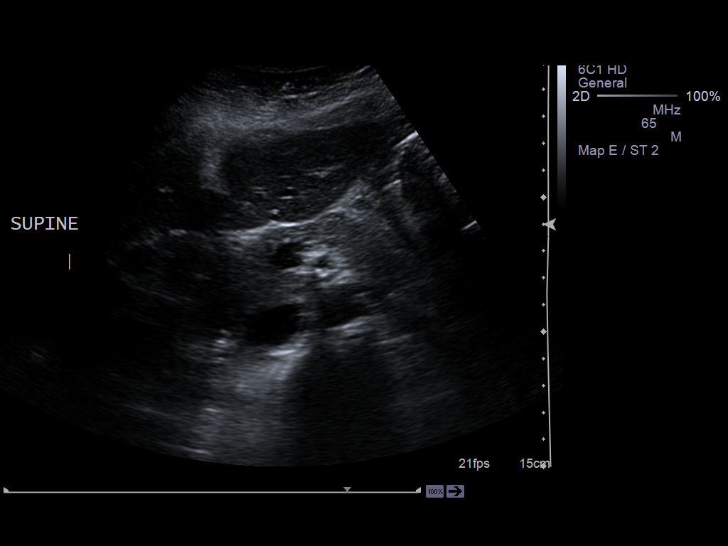
[im 4/42]
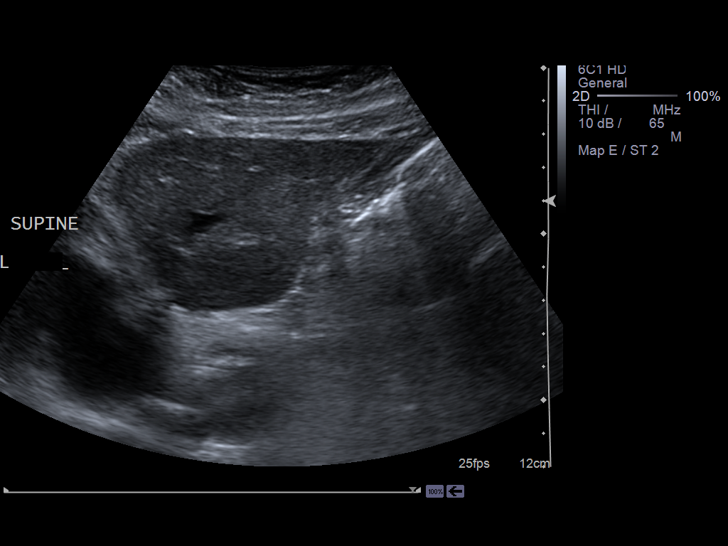
[im 7/42]
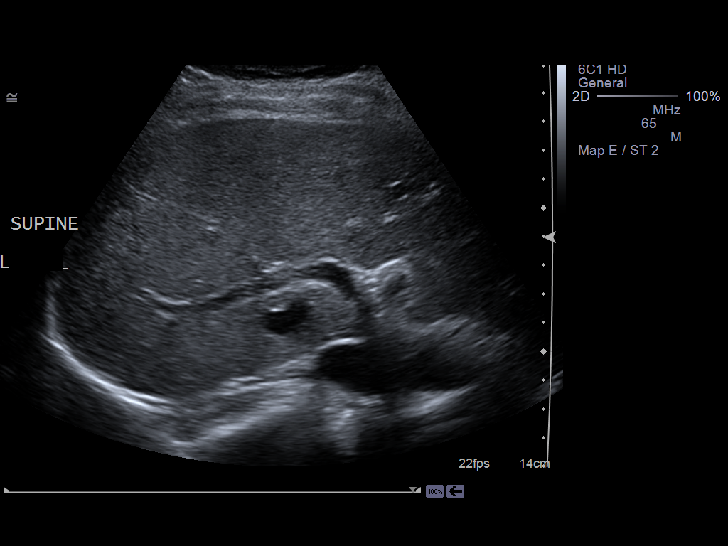
[im 11/42]
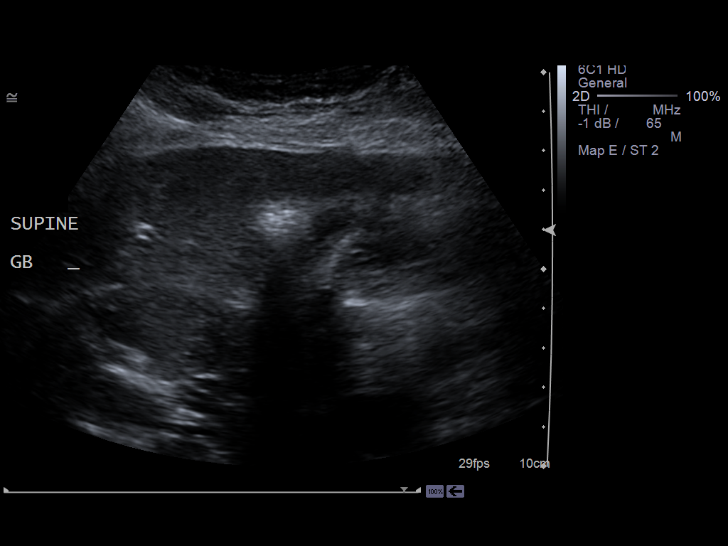
[im 14/42]
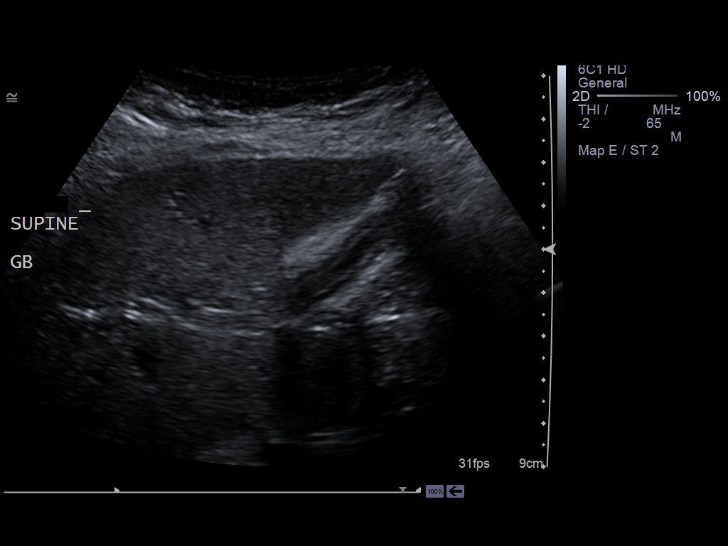
[im 16/42]
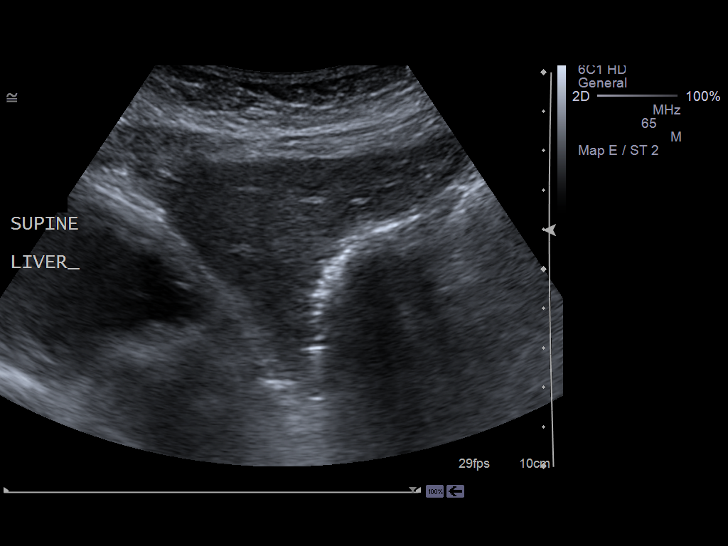
[im 19/42]
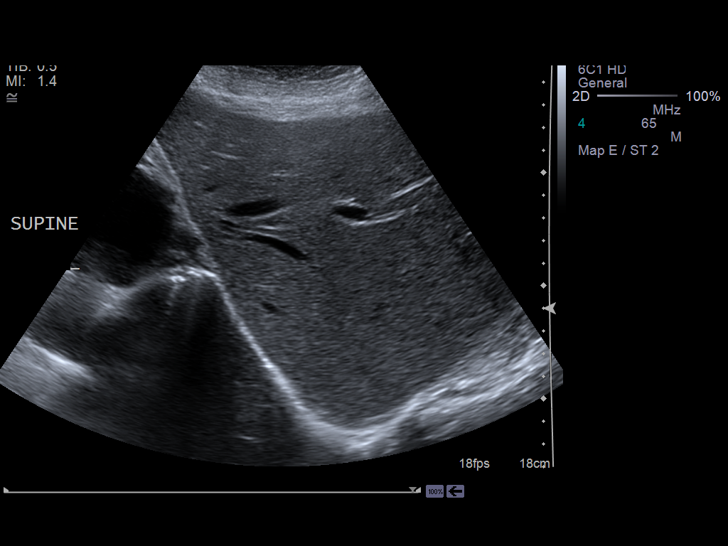
[im 23/42]
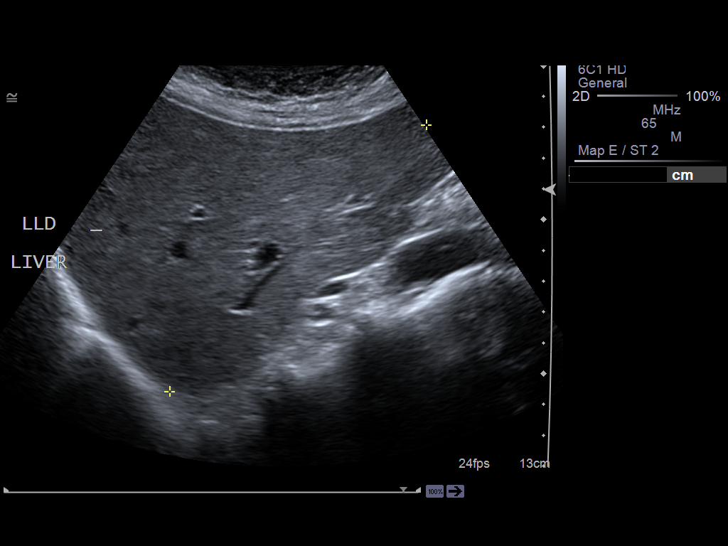
[im 26/42]
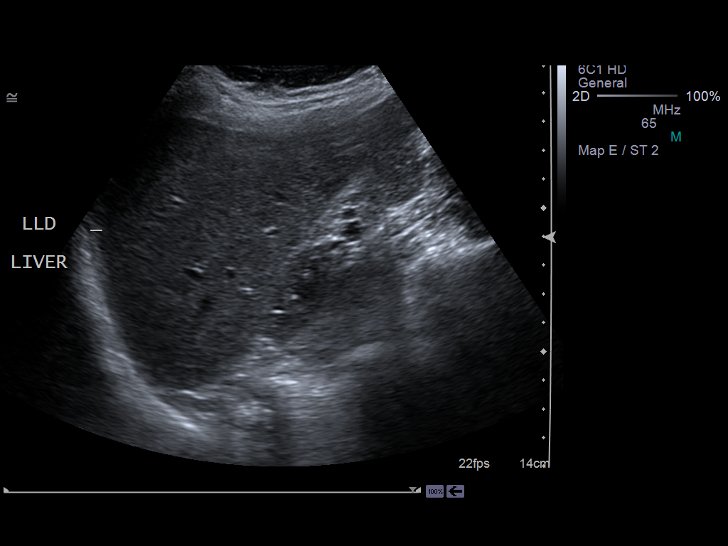
[im 28/42]
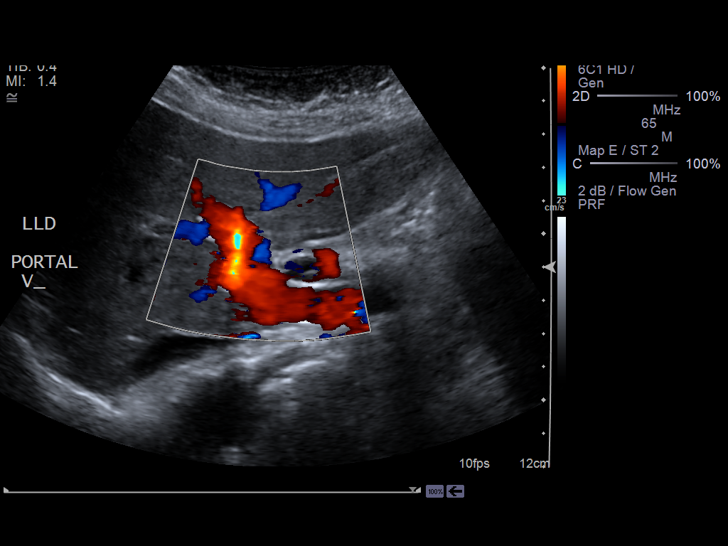
[im 31/42]
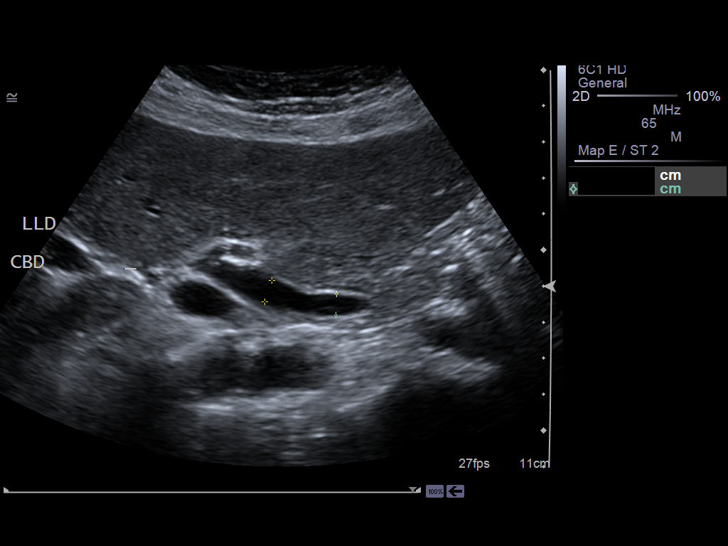
[im 35/42]
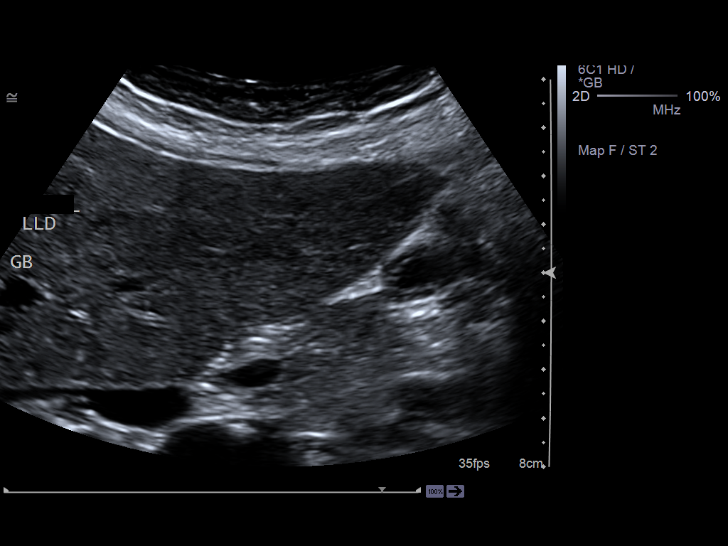
[im 38/42]
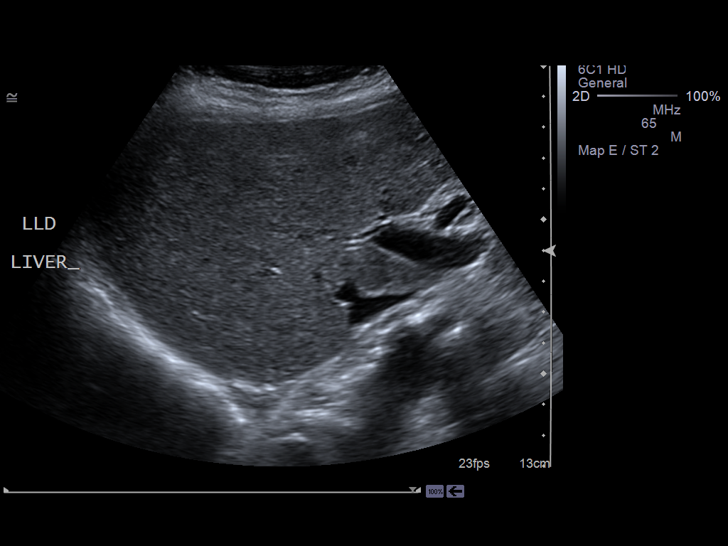
[im 42/42]
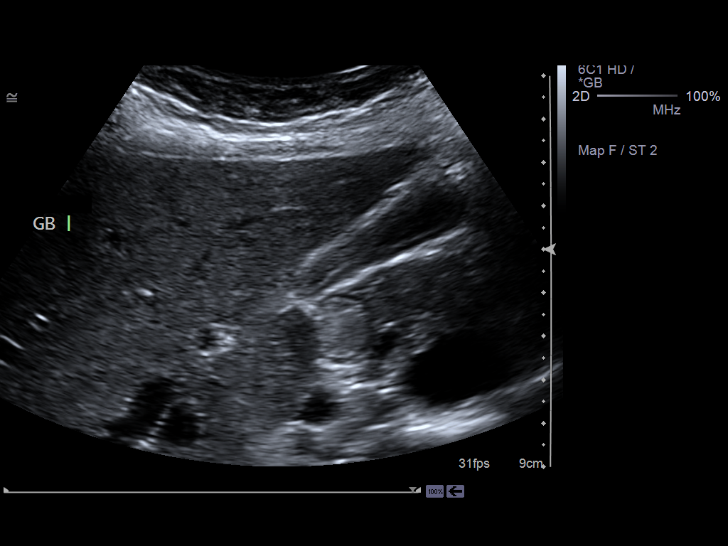

[14 of 25 positions shown; findings below may reference images not displayed]

PROCEDURE:     US  - US ABDOMEN LIMITED SURVEY  - March 26, 2013  [DATE]

RESULT:     A limited right upper quadrant ultrasound was performed.

The gallbladder is contracted. There is no positive sonographic Murphy's
sign. Accurate determination of gallbladder wall thickening could not be
made. No stones are demonstrated in the gallbladder. The liver and pancreas
exhibit normal echotexture and contour. Portal venous flow is normal in
direction toward the liver. The common bile duct measures 6.2 mm in diameter.
IMPRESSION: 1. The gallbladder is contracted. No stones are evident and there is no
sonographic evidence of acute cholecystitis.
2. The liver and pancreas are normal in appearance.
3. The common bile duct is top normal in diameter at 6.2 mm.

Incidental note is made of increased echotexture of the renal cortex on the
right with respect to the adjacent liver.

A preliminary report was sent to the [HOSPITAL] the conclusion
of the study.

[REDACTED]

## 2014-08-24 DIAGNOSIS — J449 Chronic obstructive pulmonary disease, unspecified: Secondary | ICD-10-CM | POA: Insufficient documentation

## 2014-09-06 ENCOUNTER — Ambulatory Visit: Payer: Self-pay | Admitting: Specialist

## 2014-09-16 ENCOUNTER — Ambulatory Visit: Payer: Self-pay | Admitting: Oncology

## 2014-09-24 ENCOUNTER — Ambulatory Visit: Payer: Self-pay | Admitting: Cardiothoracic Surgery

## 2014-10-07 LAB — CBC CANCER CENTER
BASOS PCT: 1.4 %
Basophil #: 0.1 x10 3/mm (ref 0.0–0.1)
EOS ABS: 0.2 x10 3/mm (ref 0.0–0.7)
EOS PCT: 2.9 %
HCT: 37 % (ref 35.0–47.0)
HGB: 11.8 g/dL — AB (ref 12.0–16.0)
LYMPHS PCT: 35.4 %
Lymphocyte #: 2.2 x10 3/mm (ref 1.0–3.6)
MCH: 29.6 pg (ref 26.0–34.0)
MCHC: 31.9 g/dL — AB (ref 32.0–36.0)
MCV: 93 fL (ref 80–100)
Monocyte #: 0.5 x10 3/mm (ref 0.2–0.9)
Monocyte %: 8.2 %
NEUTROS ABS: 3.2 x10 3/mm (ref 1.4–6.5)
NEUTROS PCT: 52.1 %
Platelet: 283 x10 3/mm (ref 150–440)
RBC: 4 10*6/uL (ref 3.80–5.20)
RDW: 13.1 % (ref 11.5–14.5)
WBC: 6.2 x10 3/mm (ref 3.6–11.0)

## 2014-10-07 LAB — PROTIME-INR
INR: 1
Prothrombin Time: 13.1 secs (ref 11.5–14.7)

## 2014-10-07 LAB — COMPREHENSIVE METABOLIC PANEL
ANION GAP: 11 (ref 7–16)
Albumin: 4.2 g/dL (ref 3.4–5.0)
Alkaline Phosphatase: 95 U/L
BILIRUBIN TOTAL: 0.4 mg/dL (ref 0.2–1.0)
BUN: 22 mg/dL — ABNORMAL HIGH (ref 7–18)
Calcium, Total: 9.4 mg/dL (ref 8.5–10.1)
Chloride: 102 mmol/L (ref 98–107)
Co2: 28 mmol/L (ref 21–32)
Creatinine: 0.89 mg/dL (ref 0.60–1.30)
EGFR (African American): >60
GLUCOSE: 87 mg/dL (ref 65–99)
Osmolality: 284 (ref 275–301)
POTASSIUM: 3.9 mmol/L (ref 3.5–5.1)
SGOT(AST): 23 U/L (ref 15–37)
SGPT (ALT): 27 U/L
SODIUM: 141 mmol/L (ref 136–145)
Total Protein: 8.3 g/dL — ABNORMAL HIGH (ref 6.4–8.2)

## 2014-10-07 LAB — APTT: Activated PTT: 32 secs (ref 23.6–35.9)

## 2014-10-11 LAB — CANCER ANTIGEN 27.29: CA 27.29: 15.4 U/mL (ref 0.0–38.6)

## 2014-10-15 ENCOUNTER — Ambulatory Visit: Payer: Self-pay | Admitting: Oncology

## 2014-10-21 ENCOUNTER — Ambulatory Visit: Payer: Self-pay | Admitting: Oncology

## 2014-11-11 IMAGING — CR DG CHEST 2V
1 series · 2 of 2 positions shown · non-contrast
Comparison: none

REASON FOR EXAM: SOB
COMMENTS:

PROCEDURE:     DXR - DXR CHEST PA (OR AP) AND LATERAL  - June 26, 2013  [DATE]
RESULT:     The lungs are clear. The cardiac silhouette and visualized bony
skeleton are unremarkable.

[Series 1: pa · 0.17mm/px · 2 of 2 slices shown]
[im 1/2]
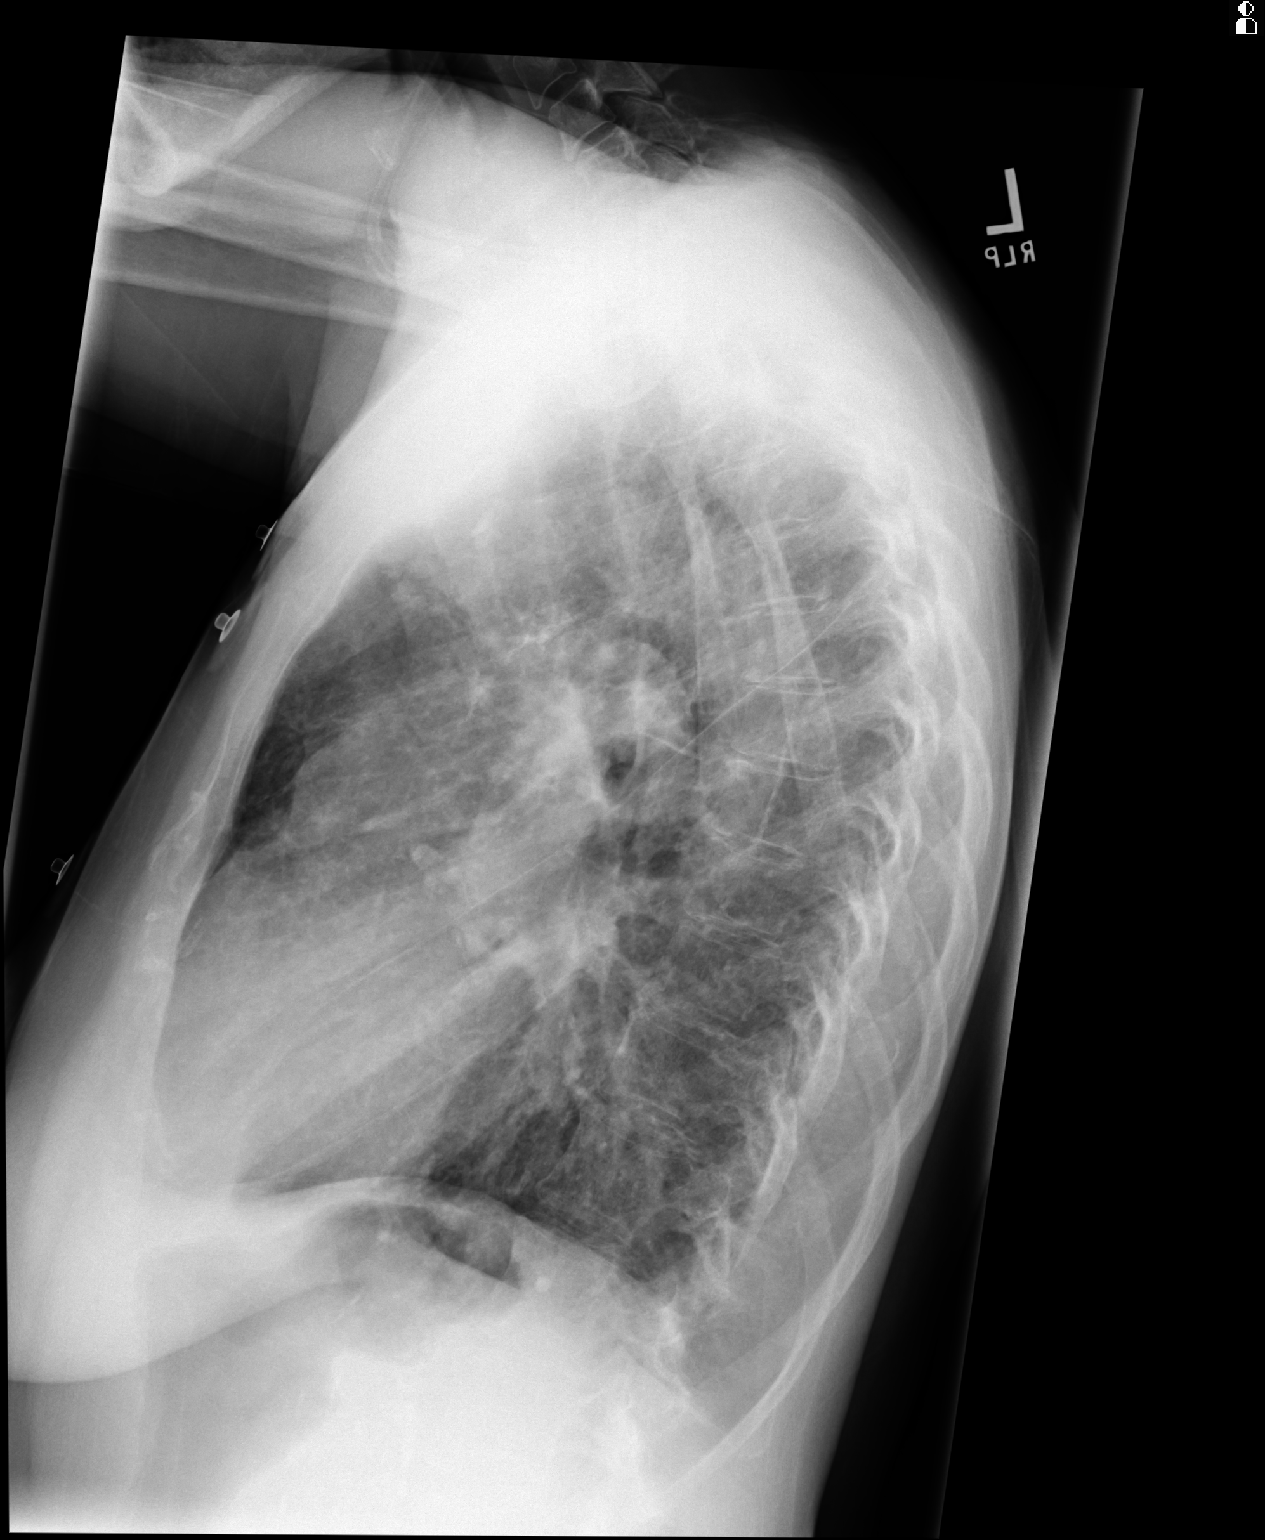
[im 2/2]
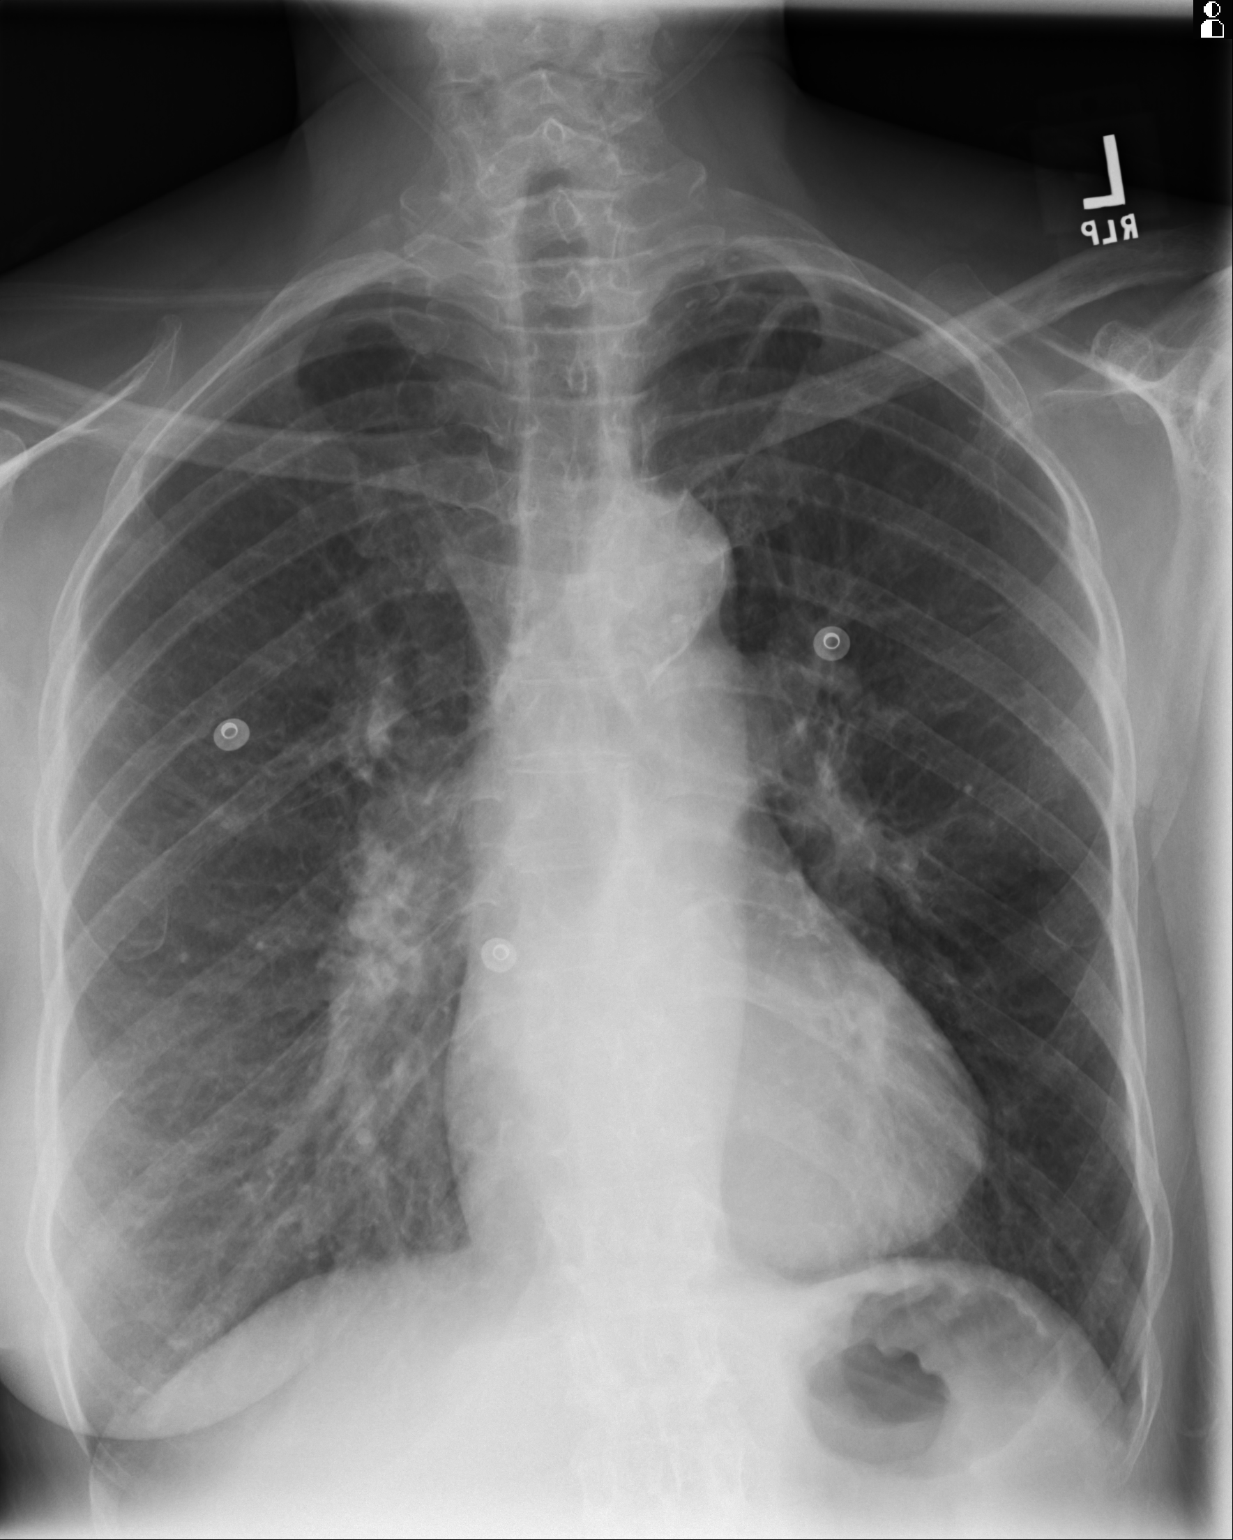

[2 of 2 positions shown; findings below may reference images not displayed]

IMPRESSION: 1. Chest radiograph without evidence of acute cardiopulmonary disease.
2. Comparison made to prior study dated 07/03/2009

## 2014-11-15 ENCOUNTER — Ambulatory Visit: Payer: Self-pay | Admitting: Oncology

## 2014-12-14 ENCOUNTER — Ambulatory Visit
Admit: 2014-12-14 | Disposition: A | Discharge status: Home / Self Care | Payer: Self-pay | Attending: Oncology | Admitting: Oncology

## 2014-12-14 ENCOUNTER — Encounter
Admit: 2014-12-14 | Disposition: A | Discharge status: Home / Self Care | Payer: Self-pay | Attending: Cardiothoracic Surgery | Admitting: Cardiothoracic Surgery

## 2015-01-14 ENCOUNTER — Encounter
Admit: 2015-01-14 | Disposition: A | Discharge status: Home / Self Care | Payer: Self-pay | Attending: Cardiothoracic Surgery | Admitting: Cardiothoracic Surgery

## 2015-01-14 ENCOUNTER — Ambulatory Visit
Admit: 2015-01-14 | Disposition: A | Discharge status: Home / Self Care | Payer: Self-pay | Attending: Oncology | Admitting: Oncology

## 2015-01-28 ENCOUNTER — Other Ambulatory Visit: Payer: Self-pay | Admitting: Oncology

## 2015-01-28 DIAGNOSIS — Z853 Personal history of malignant neoplasm of breast: Secondary | ICD-10-CM

## 2015-02-04 NOTE — H&P (Signed)
PATIENT NAME:  Danielle [name], Danielle [name] MR#:  [id number] DATE OF BIRTH:  [birth year]  DATE OF ADMISSION:  06/26/2013  ADDENDUM  PAST MEDICAL HISTORY: Also significant for status post Tamoxifen, history of modified radical mastectomy in November 2000, history of chemotherapy, radiation therapy, Aromasin,   starting in February 2005; stopped Aromasin in May 2010. Also history as mentioned above,  fracture of right arm many years ago.   FAMILY HISTORY: The patient's sister had breast carcinoma. Hypertension in grandmother.   SOCIAL HISTORY: Still smokes approximately 1/2 pack a day for 30 years, tried to quit.   MEDICATIONS: Citalopram 10 mg p.o. daily, meloxicam 7.5 mg p.o. daily, hydrochlorothiazide/losartan 12.5/50 mg p.o. once daily.   REVIEW OF SYSTEMS:  Positive for pains in the lower chest, epigastric area. Some postnasal drip and sinus congestion, cough and wheezes. No shortness of breath. Also orthopnea, feeling presyncopal. Arrhythmias, dyspnea on exertion, as well as chest pains intermittently, especially whenever she coughs. Denies fevers or chills, fatigue, weakness, weight loss or gain.  EYES: Denies blurry vision, double vision, glaucoma or cataracts.  EARS, NOSE, THROAT: Denies any tinnitus, allergies, epistaxis, sinus pain, or difficulty swallowing.  The patient admits to wheezes, admits to asthma, as well as COPD. Denies any painful respirations.  CARDIOVASCULAR:  Denies any edema, palpitations.  GASTROINTESTINAL: Denies any nausea, vomiting, diarrhea or constipation.  GENITOURINARY: Denies dysuria, hematuria, frequency,  incontinence.  ENDOCRINOLOGY: Denies any polydipsia, nocturia, thyroid problems, heat or cold intolerance, or thirst.  HEMATOLOGIC: Denies any anemia, easy bruising, bleeding or swollen glands.    SKIN:  Denies acne, rashes or moles. MUSCULOSKELETAL: Denies arthritis, cramps, swelling, gout.  NEUROLOGIC:  Denies epilepsy or tremor.  PSYCHIATRIC:  Denies anxiety, insomnia.  Admits to depression.   VITAL SIGNS:  On arrival to the hospital, temperature is 98.7, pulse 100, respirations 24, blood pressure 169/95, saturation was 99% on room air.  GENERAL: This is well-nourished, African-American female in moderate distress secondary to breathing problems. She is short of breath and she is tachypneic, struggling to breathe. HEENT: Her pupils are equal, react to light. Extraocular movements intact. No icterus or conjunctivitis.  There is normal hearing. No pharyngeal erythema. Mucosa is moist.  NECK:  No masses, supple, nontender. Thyroid not enlarged.  No adenopathy, no JVD, carotid bruits bilaterally.  Full range of motion. LUNGS: Wheezes as well as rhonchi and crackles anteriorly, more in the middle of lung on the right side. No dullness to percussion. In mild respiratory distress.  CARDIOVASCULAR: S1, S2 appreciated. No murmurs or rubs were noted. Rhythm is regular. PMI not lateralized. Chest is nontender to palpation, with 1+ pedal pulses. No lower extremity edema, calf tenderness or cyanosis.  ABDOMEN: Soft, nontender. Bowel sounds are present. No hepatosplenomegaly or masses were noted.  RECTAL: Deferred.  MUSCLE STRENGTH: Able to move all extremities. No cyanosis, degenerative joint disease, or kyphosis. Gait not tested.  SKIN: No skin rashes, lesions, erythema, nodularity or induration. It was warm and dry to palpation. No adenopathy in the cervical region.  NEUROLOGIC: Cranial nerves grossly intact. Sensory is intact. No dysarthria or aphasia. The patient is alert and oriented to time, person, place. Cooperative. Memory is good. No significant confusion, agitation or depression noted.   LABS:  BMP was within normal limits, except her potassium level was 3.1. Bicarbonate was 26. B-type Natriuretic Peptide 1264. Liver enzymes normal. Cardiac enzymes normal. CBC: White blood cell count 9.3, hemoglobin 12.3, platelet count was 233. D-dimer was normal at 0.33.   EKG:  Sinus tach at 101 beats per minute, normal axis. No acute ST-T changes were noted. Chest x-ray revealed COPD, questionable somewhat enlarged right hilum, questionable pneumonia.   ASSESSMENT AND PLAN: 1.  Acute respiratory failure. Admit patient to medical floor. Continue oxygen therapy. Initiate therapy for chronic obstructive pulmonary disease exacerbation. Continue antibiotics and inhalation therapy.  2.  COPD exacerbation as above.  3.  Hypertension. Resume blood pressure medications. 4.  Depression. Will continue citalopram.   TIME SPENT:  50 minutes.   ____________________________ Theodoro Grist, MD rv:mr D: 06/26/2013 19:01:00 ET T: 06/26/2013 21:17:07 ET JOB#: [id number]  cc: [name], FNP Theodoro Grist, MD, <Dictator>   Jayline Kilburg MD ELECTRONICALLY SIGNED 07/13/2013 9:39

## 2015-02-04 NOTE — Discharge Summary (Signed)
PATIENT NAME:  Danielle [name], Danielle [name] MR#:  [id number] DATE OF BIRTH:  05-May-1956  DATE OF ADMISSION:  06/26/2013 DATE OF DISCHARGE:  06/30/2013  ADMITTING DIAGNOSIS: Shortness of breath.   DISCHARGE DIAGNOSES:  1.  Shortness of breath due to acute chronic obstructive pulmonary disease exacerbation. According to the patient, no history of chronic obstructive pulmonary disease.  2.  Hypertension.  3.  Depression.  4.  History of left arm lymphedema.  5.  Nicotine addiction.  6.  Status post right shoulder surgery.  7.  History of breast cancer, status post chemotherapy as well as radiation therapy.  8.  Mild-to-moderate valvular disease as noted per echocardiogram.  CONSULTANTS: None.  PERTINENT LABS AND EVALUATIONS: Admitting glucose 89, BUN 12, creatinine 0.84, sodium 138, potassium 3.1, chloride 108, CO2 is 26, calcium 8.9. LFTs were normal. Troponin less than 0.02 x 3. WBC 9.3, hemoglobin 12.3, platelet count was 233. Blood cultures x 2 no growth. Sputum culture shows few gram variable rods. Echocardiogram of the heart showed EF 50% to 55%, mild mitral valve regurg, mild-to-moderate aortic valve sclerosis, calcification, mildly elevated pulmonary artery pressures, mild-to-moderate tricuspid regurg.   HOSPITAL COURSE: Please refer to H and P done by the admitting physician. The patient is a 59 year old African American female, who presented to the ED with a chief complaint of shortness of breath, who also has a history of tobacco abuse. No previous history of having diagnosed COPD. She came with shortness of breath. She was having a lot of wheezing. She was also hypoxic. Her chest x-ray was negative. She was admitted for COPD exacerbation. The patient was treated with nebulizers, steroids and inhalers with significant improvement in her symptoms. She was counseled strongly regarding smoking cessation. The patient is doing much better; however, she continues to be hypoxic with ambulation. At this point,  she is being discharged on home O2. She will need re-evaluation to determine if she needs oxygen.   DISCHARGE MEDICATIONS: Meloxicam 7.5 p.o. daily, citalopram 20 daily, hydrochlorothiazide/losartan 25/100 daily, loratadine 10 daily, vitamin D2 50,000 international units q. monthly, prednisone taper 60 mg taper by 10 until complete, budesonide formoterol 2 puffs b.i.d., tiotropium 18 mcg daily, nicotine 14 mg one patch daily and Combivent 1 puff 4  times a day.   DIET: Low-sodium.  OXYGEN: 2 L.   ACTIVITY: As tolerated.   FOLLOWUP: With primary M.D. in 1 to 2 weeks. Follow up with Dr. Raul Del in 2 to 4 weeks as a new patient for COPD.   TIME SPENT: 35 minutes spent on the discharge.  ____________________________ Lafonda Mosses. Posey Pronto, MD shp:aw D: 07/01/2013 08:35:01 ET T: 07/01/2013 09:02:21 ET JOB#: [id number]  cc: Niesha Bame H. Posey Pronto, MD, <Dictator> Alric Seton MD ELECTRONICALLY SIGNED 07/03/2013 13:01

## 2015-02-04 NOTE — H&P (Signed)
PATIENT NAME:  Danielle [name], PELTO MR#:  [id number] DATE OF BIRTH:  December 21, 1955  DATE OF ADMISSION:  06/26/2013  PRIMARY CARE PROVIDER: [name], FNP  HISTORY OF PRESENT ILLNESS: The patient is a 59 year old African American female with history of tobacco abuse for 30 years, who presents to the hospital with complaints of shortness of breath, cough, as well as hypoxia with O2 sats of 88% on room air. According to the patient, she was doing well up until Sunday evening, which was approximately 5 days ago. She started having cough as well as wheezing. She also had been experiencing wheezing as well as shortness of breath, especially whenever she lies down. She has to use 4 pillows to be able to breathe. She admits of having dry cough, but denies any significant fevers or chills. Admits of some PND symptoms as well, however, denies any lower extremity swelling. Because of this, she decided to come to the Emergency Room. She has been also experiencing significant epigastric abdominal pain, especially whenever she coughs, with some twinging sensation in bilateral flanks, but denies any pain whenever she takes a deep breath. Denies any hemoptysis. Her D-dimer is abnormal, however. Because of her abnormal exam as well as shortness of breath and hypoxia, hospitalist services were contacted for admission.   PAST MEDICAL HISTORY: Significant for history of hypertension, depression, left arm, lymphedema, smoking less than 1/2 pack a day for 30 years, right shoulder surgery, breast carcinoma status post chemotherapy as well as radiation therapy.  DICTATION ENDS HERE. Please see addendum for continuation of dictation.   ____________________________ Theodoro Grist, MD rv:jm D: 06/26/2013 18:59:00 ET T: 06/26/2013 20:56:06 ET JOB#: [id number]  cc: [name], FNP Theodoro Grist, MD, <Dictator>    Brylie Sneath MD ELECTRONICALLY SIGNED 07/13/2013 9:39

## 2015-02-05 NOTE — H&P (Signed)
PATIENT NAME:  Danielle [name], HUCKABA MR#:  [id number] DATE OF BIRTH:  Mar 05, 1956  DATE OF ADMISSION:  12/07/2013  PRIMARY CARE PHYSICIAN: [name], MD  REFERRING PHYSICIAN: Dr. Beather Arbour.   CHIEF COMPLAINT: Fever.   HISTORY OF PRESENT ILLNESS: The patient is a 59 year old African American female with past medical history of breast cancer status post left-sided mastectomy, chemotherapy and radiation therapy who is presenting to the ER with the chief complaint of fever. The patient had 102 degrees Fahrenheit of temperature associated with chills at home. When she came into the ER, her temp was at 104 degrees Fahrenheit. A chest x-ray has revealed bilateral pneumonia. The patient was given IV Zosyn and vancomycin. The patient has chronic left upper extremity lymphedema, but for the past 2 days the swelling is worse, associated with pain and redness. The patient is also diagnosed with left upper extremity cellulitis. During my examination, the patient denies any chest pain or shortness of breath. The patient follows up with Dr. Oliva Bustard as an outpatient regarding her breast cancer and currently she is cancer free and under surveillance, as reported by the patient. No family members are at bedside. Denies any other complaints.   PAST MEDICAL HISTORY: Breast cancer status post radical mastectomy, chemotherapy and radiation therapy and now she is under surveillance.   PAST SURGICAL HISTORY: Radical mastectomy.   ALLERGIES: No known drug allergies.   PSYCHOSOCIAL HISTORY: Lives alone. She used to smoke but quit smoking. Denies alcohol or illicit drug use.   FAMILY HISTORY: The patient's sister had breast cancer. Grandmother has hypertension.  HOME MEDICATIONS: Vitamin D2 one capsule p.o. once a month, meloxicam 7.5 mg once a day as needed for pain, hydrochlorothiazide/losartan 25/100 one tablet p.o. once daily, Combivent 1 puff inhalation 4 times a day, citalopram 20 mg p.o. once daily.  REVIEW OF SYSTEMS:   CONSTITUTIONAL: Complaining of fever, fatigue, weakness.  EYES: Denies blurry vision or double vision.  ENT: Denies epistaxis, discharge.  RESPIRATORY: Denies COPD. Complaining of cough.  CARDIOVASCULAR: No chest pain, palpitations.  GASTROINTESTINAL: Denies nausea, vomiting, diarrhea.  GENITOURINARY: No dysuria or hematuria.  GYN AND BREASTS: Has breast cancer, status post radical mastectomy, under surveillance. No hysterectomy.  ENDOCRINE: Denies polyuria, nocturia, thyroid problems.  HEMATOLOGIC AND LYMPHATIC: No anemia, easy bruising, bleeding.  INTEGUMENTARY: No acne, rash, lesions.  MUSCULOSKELETAL: No joint pain in the neck and back. Complaining of left upper extremity swelling, edema, pain, and itching.  NEUROLOGIC: No vertigo, ataxia. PSYCH: No ADD or OCD.   PHYSICAL EXAMINATION: VITAL SIGNS: Temperature 98.5, pulse 71, respirations 16, blood pressure 127/77, pulse ox 98%.  GENERAL APPEARANCE: Not in acute distress. Moderately built and nourished.  HEENT: Normocephalic, atraumatic. Pupils are equally reacting to light and accommodation. No scleral icterus. No conjunctival injection. No sinus tenderness. No postnasal drip.  NECK: Supple. No JVD. No thyromegaly.  LUNGS: Decreased breath sounds at the bases. No accessory muscle usage. No end-expiratory wheezing is present.  CARDIAC: S1, S2 normal. Regular rate and rhythm. No murmurs.  ABDOMEN: Soft. Bowel sounds are positive in all 4 quadrants. Nontender, nondistended. No hepatosplenomegaly. No masses felt.  NEUROLOGIC: Awake, alert and oriented x3. Motor and sensory grossly intact. Reflexes are 2+. EXTREMITIES:  Left upper extremity has chronic lymphedema, but now it is more edematous, erythematous and tender with excoriations from scratching. Right lower extremity: No swelling, edema, or cyanosis. No clubbing. PSYCH: Normal mood and affect.   LABORATORIES AND IMAGING STUDIES: LFTs are normal. WBC 8.6, hemoglobin 11.6, hematocrit  35.5, and platelets are 248.   Blood culture x2: No growth.   Urinalysis: Straw-colored, clear in appearance, bilirubin negative, ketones negative, specific gravity 1.015. Nitrites and leukocyte esterase are negative.   Chem-8: Potassium 2.9, chloride 109, CO2 25. GFR greater than 60. Anion gap 6. BUN, creatinine, osmolality and calcium are intact/normal.   ASSESSMENT AND PLAN: A 59 year old [place] American female presenting to the ER with the chief complaint of fever and will be admitted with the following assessment and plan:  1.  Sepsis secondary to pneumonia and left upper extremity cellulitis. Blood cultures were obtained. The patient will be on IV antibiotics.  2.  Hypokalemia, replaced with potassium supplement. 3.  History of breast cancer status post radical mastectomy, status post chemo and radiation therapy. Currently under surveillance. Follow up with Dr. Oliva Bustard as scheduled.  4.  Will provide gastrointestinal and deep vein thrombosis prophylaxis.   She is FULL code. Daughter is the medical power of attorney. The diagnosis and plan of care was discussed with the patient.  TOTAL TIME SPENT ON ADMISSION: 45 minutes.   ____________________________ Nicholes Mango, MD ag:sb D: 12/07/2013 07:58:50 ET T: 12/07/2013 10:35:02 ET JOB#: [id number]  cc: Nicholes Mango, MD, <Dictator> Nicholes Mango MD ELECTRONICALLY SIGNED 12/20/2013 1:05

## 2015-02-05 NOTE — Discharge Summary (Signed)
PATIENT NAME:  Danielle [name], COVELL MR#:  [id number] DATE OF BIRTH:  [birth year]  DATE OF ADMISSION:  12/07/2013 DATE OF DISCHARGE:  12/10/2013  ADMITTING DIAGNOSIS: Left upper extremity cellulitis.   DISCHARGE DIAGNOSES: 1.  Left arm cellulitis.  2.  Hypokalemia.  3.  Anemia.  4.  Culture and sensitivity, bacteremia; contamination likely, 1 out of 4 cultures. 5. History of breast carcinoma, status post radical mastectomy with resultant left arm lymphedema.  6.  Neck pain, suspected neuropathic pain.   7.  History of chronic obstructive pulmonary disease. 8.  Hypertension. 53.  Former tobacco abuse.   DISCHARGE CONDITION: Stable.   DISCHARGE MEDICATIONS: The patient is to resume;  1. Meloxicam 7.5 mg p.o. daily as needed. 2. Citalopram 10 mg p.o. daily. 3. Loratadine 10 mg p.o. daily. 4. Doxycycline (a new medication) 100 mg p.o. twice daily for 8 more days.  5. Acetaminophen/hydrocodone 325/5 mg 1 tablet every 4 hours as needed. 6. Gabapentin 300 mg p.o. at bedtime.   The patient is to resume also her outpatient medications which are; 1. Hydrochlorothiazide/losartan 25/100 1 tablet once daily. 2. Vitamin D2 50,000 units once monthly. 3. Tiotropium 1 capsule inhalation daily. 4. Combivent Respimat 100/20 1 puff 4 times daily as needed.   HOME OXYGEN: None.   DIET: 2 grams salt, low fat, low cholesterol, regular consistency.   ACTIVITY LIMITATIONS: As tolerated.   FOLLOWUP:  Appointment with Danielle Edwards, FNP, in 2 days after discharge for further management.    CONSULTANTS: Care management, social work.   RADIOLOGIC STUDIES: Chest x-ray PA and lateral, the 22nd of February, 2015,  revealed a vascular congestion, without significant pulmonary edema, mild patchy bilateral airspace opacities, could reflect mild pneumonia according to radiology. Doppler ultrasound of left upper extremity the 23rd of February, 2015, showed no evidence of deep vein thrombosis within the left upper  extremity. A CT scan of the humerus, as well as forearm on the left, with contrast revealed diffuse forearm subcutaneous infiltration most consistent with edema. This can be associated with cellulitis, chronic venous insufficiency or lymphedema. Attenuation of the basilic as well as brachial veins in the upper arm raised the possibility of chronic venous insufficiency, insufficiency as the course of upper extremity edema. Subcutaneous infiltration of the distal upper arm was also noted. The appearance was nonspecific, which can be associated with cellulitis, chronic venous insufficiency, as well as lymphedema, among other courses .   HISTORY OF PRESENT ILLNESS:  The patient is a 59 year old African-American female with past medical history significant for history of breast carcinoma with radical left mastectomy and resultant left arm lymphedema, presents to the hospital with complaints of left arm pain and fevers. Please refer to Dr. Tomasa Rand admission on the 23rd of February, 2015. On arrival to the Emergency Room, the patient had fevers to as high as 102 with chills. On arrival to the Emergency Room, her temperature was even higher to 104. Her chest x-ray was concerning for possible bacterial pneumonia. Her arm was noted to be painful, very swollen, erythematous with increased warmth of the skin, concerning for cellulitis.   HOSPITAL COURSE: The patient was admitted to the hospital for further evaluation with diagnosis of possible pneumonia, as well as cellulitis and she was initiated on broad-spectrum antibiotic therapy. On arrival to the hospital, the patient's lab data revealed only low potassium level of 2.9; otherwise, BMP was unremarkable. The patient's magnesium level was 1.8. Liver enzymes were normal. CBC: White blood cell count was 8.2, hemoglobin was  11.6, platelet count was 248 with absolute neutrophil count of 7.4. Blood cultures taken on the day of admission, the 22nd of February, 2015 revealed 1  out of 4 cultures, gram-positive cocci which were actually coagulase-negative Staphylococcus, felt to be contamination. Repeated blood cultures taken the 25th of February, 2015, did not show any growth in 24 hours. Urinalysis was unremarkable. The patient was treated with broad-spectrum antibiotic therapy. She did not have and did not develop any signs of pneumonia; however, her symptoms resolved with resolution of her left upper extremity edema, as well as pain and inflammation which was felt to be due to a cellulitis. The patient was advised to continue antibiotic therapy for 8 more days to complete a 10 day course. She is to follow up with her primary provider, Danielle [name], [place], in the next few days after discharge for further recommendations. She is also to continue pain medications as needed. Because of discomfort pains in her back and some pains in her arms,  it was felt that the patient also has neuropathic pain, so Neurontin was prescribed for her upon discharge as well. For history of breast carcinoma, the patient is to continue her outpatient management and follow up with cancer doctors. For history of COPD, hypertension;  the patient is to continue her outpatient management. No further changes were made.   The patient is being discharged in stable condition with the above-mentioned medications and follow-up. Her vital signs on the day of discharge; temperature was 98.2, pulse was 72, respiratory rate was 20,  blood pressure 141/81. Saturation was 95% on room air at rest.   TIME SPENT: 40 minutes.   ____________________________ Danielle Grist, MD rv:NTS D: 12/10/2013 17:48:58 ET T: 12/11/2013 04:55:50 ET JOB#: [id number]  cc: Danielle Grist, MD, <Dictator> Danielle Edwards, FNP Angel Hobdy MD ELECTRONICALLY SIGNED 12/16/2013 21:01

## 2015-02-07 LAB — SURGICAL PATHOLOGY

## 2015-02-09 LAB — CBC CANCER CENTER
BASOS PCT: 1.9 %
Basophil #: 0.1 x10 3/mm (ref 0.0–0.1)
Eosinophil #: 0.2 x10 3/mm (ref 0.0–0.7)
Eosinophil %: 3.4 %
HCT: 34.9 % — ABNORMAL LOW (ref 35.0–47.0)
HGB: 11.5 g/dL — ABNORMAL LOW (ref 12.0–16.0)
Lymphocyte #: 1.8 x10 3/mm (ref 1.0–3.6)
Lymphocyte %: 36.4 %
MCH: 30.1 pg (ref 26.0–34.0)
MCHC: 32.8 g/dL (ref 32.0–36.0)
MCV: 92 fL (ref 80–100)
MONO ABS: 0.4 x10 3/mm (ref 0.2–0.9)
Monocyte %: 9.1 %
NEUTROS PCT: 49.2 %
Neutrophil #: 2.4 x10 3/mm (ref 1.4–6.5)
PLATELETS: 249 x10 3/mm (ref 150–440)
RBC: 3.8 10*6/uL (ref 3.80–5.20)
RDW: 15.5 % — AB (ref 11.5–14.5)
WBC: 4.9 x10 3/mm (ref 3.6–11.0)

## 2015-02-09 LAB — COMPREHENSIVE METABOLIC PANEL
ALK PHOS: 69 U/L
Albumin: 4.3 g/dL
Anion Gap: 8 (ref 7–16)
BILIRUBIN TOTAL: 0.9 mg/dL
BUN: 16 mg/dL
CALCIUM: 9.1 mg/dL
CO2: 29 mmol/L
CREATININE: 0.81 mg/dL
Chloride: 95 mmol/L — ABNORMAL LOW
EGFR (African American): >60
EGFR (Non-African Amer.): >60
Glucose: 105 mg/dL — ABNORMAL HIGH
Potassium: 3.8 mmol/L
SGOT(AST): 29 U/L
SGPT (ALT): 20 U/L
Sodium: 132 mmol/L — ABNORMAL LOW
Total Protein: 7.7 g/dL

## 2015-02-13 NOTE — Consult Note (Signed)
Reason for Visit: This 59 year old Female patient presents to the clinic for initial evaluation of  lung cancer .   Referred by Dr. Oliva Bustard.  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 She underwent a screening CT scan of the chest showing a right upper lobe spiculated nodule as well as a right middle lobe nodule discovered in 2015 November. PET CT scan confirmed hypermetabolic activity in the right upper lobe right middle lobe nodule not hypermetabolic. She also had a hypermetabolic right axillary node which was thought to be secondary to tracer injection. She underwent a fine-needle CT-guided biopsy which was positive for squamous cell carcinoma. She's been seen by surgical oncology team based on poor pulmonary functions has been declined for surgery. She is seen today for consideration of radiation therapy. She is doing well specifically denies cough hemoptysis or chest tightness.  Past Hx:    Depression:    lymphedema:    htn:    breast cancer:    Shoulder Surgery - Right:    Mastectomy:   Past, Family and Social History:  Past Medical History positive   Cardiovascular hypertension   Neurological/Psychiatric depression   Past Surgical History right shoulder surgery   Past Medical History Comments lymphedema left upper extremity   Family History positive   Family History Comments Sr. with breast cancer grandmother with hypertension   Social History positive   Social History  Comments 20 pack year smoking history, no EtOH abuse history stop smoking 15 months prior   Additional Past Medical and Surgical History seen by herself today   Allergies:   No Known Allergies:   Home Meds:  Home Medications: Medication Instructions Status  meloxicam 7.5 mg oral tablet 1 tab(s) orally once a day as needed - for neck pain. Active  hydrochlorothiazide-losartan 25 mg-100 mg oral tablet 1 tab(s) orally once a day Active  Symbicort 160 mcg-4.5 mcg/inh inhalation aerosol 2 puff(s) inhaled 2 times a day Active  tiotropium 18 mcg inhalation capsule 1 cap(s) inhaled once a day Active  Xanax 0.5 mg oral tablet 1 tab(s) orally once -1 HOUR PRIOR TO PET SCAN. 1 TABLET ONLY. NO REFILLS  HRJ Active  Prednisone 10 mg 4 pills once a day for 2 days 3 pills once a day for 2 days 2 pills once a day for 2 days 1 pill once a day for 2 days   Active  citalopram 20 mg oral tablet 1 tab(s) orally once a day Active   Review of Systems:  General negative   Performance Status (ECOG) 0   Skin negative   Breast see HPI   Ophthalmologic negative   ENMT negative   Respiratory and Thorax negative   Cardiovascular negative   Gastrointestinal negative   Genitourinary negative   Musculoskeletal negative   Neurological negative   Psychiatric negative   Hematology/Lymphatics negative   Endocrine negative   Allergic/Immunologic negative   Physical Exam:  General/Skin/HEENT:  General normal   Skin normal   Eyes normal  ENMT normal   Head and Neck normal   Additional PE well-developed female in NAD she is status postleft modified radical mastectomy. Chest wall is clear. Lungs are clear to A&P cardiac examination shows regular rate and rhythm. She does have lymphedema of her left upper extremity. No supraclavicular or axillary adenopathy is identified.   Breasts/Resp/CV/GI/GU:  Respiratory and Thorax normal   Cardiovascular normal   Gastrointestinal normal    Genitourinary normal   MS/Neuro/Psych/Lymph:  Musculoskeletal normal   Neurological normal   Lymphatics normal   Other Results:  Radiology Results: CT:    23-Nov-15 08:34, CT Chest Without Contrast  CT Chest Without Contrast   REASON FOR EXAM:    RT pulmonary nodule seen on [place] chest xray  COMMENTS:       PROCEDURE: KCT - KCT CHEST WITHOUT CONTRAST  - Sep 06 2014  8:34AM     CLINICAL DATA:  Left breast cancer.  Follow-up chest radiograph.    EXAM:  CT CHEST WITHOUT CONTRAST    TECHNIQUE:  Multidetector CT imaging of the chest was performed following the  standard protocol without IV contrast..    COMPARISON:  CT chest 02/23/2010.  FINDINGS:  No pathologically enlarged mediastinal, axillary or internal mammary  lymph nodes. Hilar regions are difficult to definitively evaluate  without IV contrast. Postoperative changes of left mastectomy.  Extensive 3 vessel coronary artery calcification. Heart size normal.  No pericardial effusion.    Centrilobular and paraseptal emphysema with bullous components in  the apices. A new spiculated nodule in the anterior segment right  upper lobe measures 9 mm (6 x 11 mm), image 22. An irregular and  somewhat amorphous area of nodularity in the anterior segment right  upper lobe measures approximately 2.2 x 2.3 cm (series 4, image 30),  previously, approximately 1.1 x 1.8 cm. 5 mm right upper lobe nodule  (image 30), new. Subpleural ground-glass in the anterolateral right  lower lobe is new and may be infectious or inflammatoryin etiology  or due to scarring. It does not have masslike features. There is  scarring at the apex of the left upper lobe with a nodular  component, measuring 8 mm (series 4, image 7), new. Subpleural  radiation fibrosis in the anterior left lung. No pleural fluid.  Airway is unremarkable.    Incidental imaging of the upper abdomen shows slight irregularity of  the liver margin. 6 mm low-attenuation lesion in  the left hepatic  lobe is unchanged and likely a cyst or hemangioma. Visualized  portions of the adrenal glands and right kidney are unremarkable. A  hyperdense lesion in the upper pole left kidney measures  approximately 10 mm, likely corresponding to a 10 mm low-attenuation  lesion on 02/23/2010. High density may be due to interval hemorrhage  or proteinaceous debris. Definitive characterization is difficult  due to size and lack of postcontrast imaging. Visualized portions of  the spleen, pancreas and stomach are grossly unremarkable. No upper  abdominal adenopathy.    No worrisome lytic or sclerotic lesions.     IMPRESSION:  1. Spiculated right upper lobe nodule is new. Irregular and somewhat  amorphous nodularity in the anterior segment right upper lobe is  progressive. Apical left upper lobe nodule is new. Findings are  worrisome for multifocal adenocarcinoma. PET may be of limited  clinical utility given the size and/or slow progression of these  lesions. These results will be called to the ordering clinician or  representative by the Radiologist Assistant, and communication  documented in the PACS or zVision Dashboard.  2. 5 mm right upper lobe nodule is new, nonspecific. Continued  attention on followup exams is warranted.  3. Extensive 3 vessel coronary artery calcification.  4. Slight irregularity of the livermargin is indicative of  cirrhosis.      Electronically Signed    By: Lorin Picket M.D.    On: 09/06/2014 10:37         Verified By: Luretha Rued, M.D.,  Nuclear Med:    11-Dec-15 10:05, PET/CT Scan Breast CA Stage/Restaging  PET/CT Scan Breast CA Stage/Restaging   REASON FOR EXAM:    Lung nodule Hx breast CA  COMMENTS:       PROCEDURE: PET - PET/CT RESTG BREAST CA  - Sep 24 2014 10:05AM     CLINICAL DATA:  Subsequent treatment strategy for breast carcinoma.  New lung nodules.. Patient status post radical mastectomy in 2000.    EXAM:  NUCLEAR  MEDICINE PET SKULL BASE TO THIGH    TECHNIQUE:  11.5 MCi F-18 FDG was injected intravenously. Full-ring PET imaging  was performed from the skull base to thigh after the radiotracer. CT  data was obtained and used for attenuation correction and anatomic  localization.    FASTING BLOOD GLUCOSE:  Value: 79 mg/dl    COMPARISON:  CT 09/06/2014    FINDINGS:  NECK    No hypermetabolic lymph nodes in the neck.    CHEST    Right upper lobe spiculated nodule measuring 8 mm on image 71 of the  CT data set has associated metabolic activity with SUV max 3.0. This  is fairly intense for the small size fof this lesion.    The branching nodular densities in the right middle lobe on image 83  have mild metabolic activity suggesting inflammatory process.    There is no significant metabolic activity associated with the  nodular apical thickening at the left lung apex.    There is a hypermetabolic right axillary lymph node on image 50 of  fused data set. The lymph node associated with this activity is  small and has normal morphology. The activity is fairly intense with  SUV max = 4.0. Patient was injected with radiotracer on the right  side.    ABDOMEN/PELVIS  No abnormal hypermetabolic activity within the liver, pancreas,  adrenal glands, or spleen. No hypermetabolic lymph nodes in the  abdomen or pelvis.    SKELETON    No focal hypermetabolic activity to suggest skeletal metastasis.     IMPRESSION:  1. Metabolic activity associated with the small right upper lobe  spiculated nodule is consistent with malignancy. Differential would  include primary bronchogenic carcinoma versus metastatic breast  cancer. Favor lung cancer.  2. Mild metabolic activity associated with branching nodular pattern  within the right middle lobe likely is infectious or inflammatory  process. Cannot exclude a low-grade adenocarcinoma.  3. No significant metabolic activity associated nodular  pleural  thickening in the left lung apex.  4. Intense metabolic activity associated with a small normal  morphology right axial lymph node. Burtis Junes this to represent benign  radiotracer accumulation in the right arm lymphatics following a  right antecubital radiotracer injection.      Electronically Signed    By: Suzy Bouchard M.D.    On: 09/24/2014 12:04         Verified By: Rennis Golden, M.D.,   Relevent Results:   Relevant Scans and Labs CT scans and PET CT scans  are reviewed   Assessment and Plan: Impression:   stage I right upper lobe squamous cell carcinoma in 59 year old female with history of prior breast cancer status post radiation therapy to her left chest wall and peripheral lymphatics. Plan:   at this time I feel the patient will be good candidate for SB RT radiation therapy with the intent to deliver 5000 cGy in 5 fractions to her right upper lobe lung cancer. Certainly can avoid any significant dose to her left chest wall using SPR treatment planning and delivery. Risks and benefits of treatment including possible cough, fatigue, alteration of blood counts, loss of some normal lung volume all were explained in detail to the patient. She seems to comprehend my treatment plan well. I have set up and ordered CT simulation with [facility].  I would like to take this opportunity for allowing me to participate in the care of your patient..  Fax to Physician:  Physicians To Recieve Fax: Ovidio Kin, MD - [phone].  Electronic Signatures: Armstead Peaks (MD)  (Signed 27-Jan-16 13:25)  Authored: HPI, Diagnosis, Past Hx, PFSH, Allergies, Home Meds, ROS, Physical Exam, Other Results, Relevent Results, Encounter Assessment and Plan, Fax to Physician   Last Updated: 27-Jan-16 13:25 by Armstead Peaks (MD)

## 2015-02-16 ENCOUNTER — Other Ambulatory Visit: Payer: Self-pay | Admitting: *Deleted

## 2015-02-16 DIAGNOSIS — C3490 Malignant neoplasm of unspecified part of unspecified bronchus or lung: Secondary | ICD-10-CM

## 2015-03-07 ENCOUNTER — Encounter: Payer: Self-pay | Admitting: Respiratory Therapy

## 2015-03-07 ENCOUNTER — Inpatient Hospital Stay: Admission: RE | Admit: 2015-03-07 | Payer: Self-pay | Source: Ambulatory Visit

## 2015-03-07 DIAGNOSIS — J449 Chronic obstructive pulmonary disease, unspecified: Secondary | ICD-10-CM

## 2015-03-18 ENCOUNTER — Encounter: Payer: BLUE CROSS/BLUE SHIELD | Attending: Internal Medicine

## 2015-03-29 ENCOUNTER — Ambulatory Visit
Admission: RE | Admit: 2015-03-29 | Discharge: 2015-03-29 | Disposition: A | Discharge status: Home / Self Care | Payer: BLUE CROSS/BLUE SHIELD | Source: Ambulatory Visit | Attending: Oncology | Admitting: Oncology

## 2015-03-29 DIAGNOSIS — Z1231 Encounter for screening mammogram for malignant neoplasm of breast: Secondary | ICD-10-CM | POA: Insufficient documentation

## 2015-03-29 DIAGNOSIS — Z853 Personal history of malignant neoplasm of breast: Secondary | ICD-10-CM | POA: Diagnosis not present

## 2015-03-29 HISTORY — DX: Malignant (primary) neoplasm, unspecified: C80.1

## 2015-03-29 HISTORY — DX: Personal history of antineoplastic chemotherapy: Z92.21

## 2015-03-29 HISTORY — DX: Personal history of irradiation: Z92.3

## 2015-04-23 IMAGING — CR DG CHEST 2V
1 series · 2 of 2 positions shown · non-contrast
Comparison: Chest radiograph performed 06/26/2013

CLINICAL DATA: Fever for 1 day.  Cough and shortness of breath.

EXAM:
CHEST  2 VIEW

[Series 1: x chest ap · 0.14mm/px · 2 of 2 slices shown]
[im 1/2]
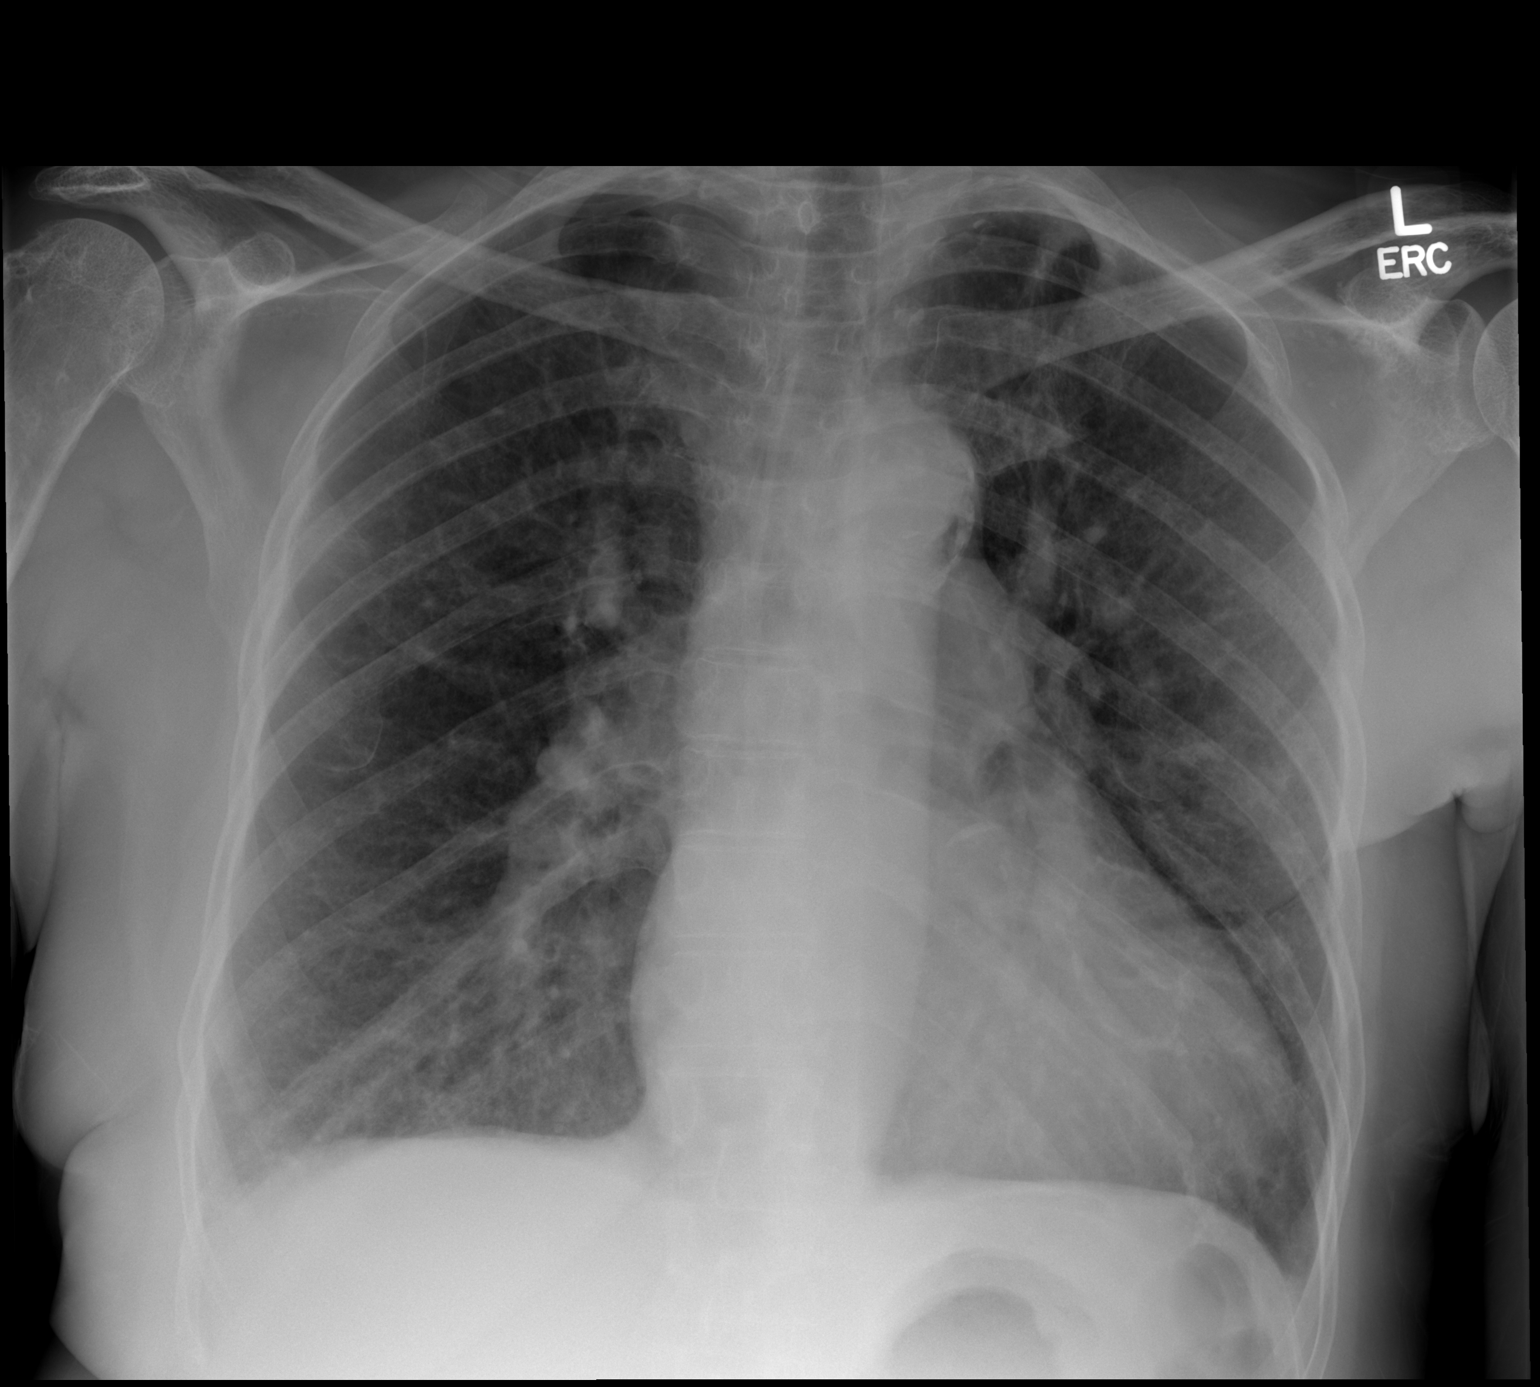
[im 2/2]
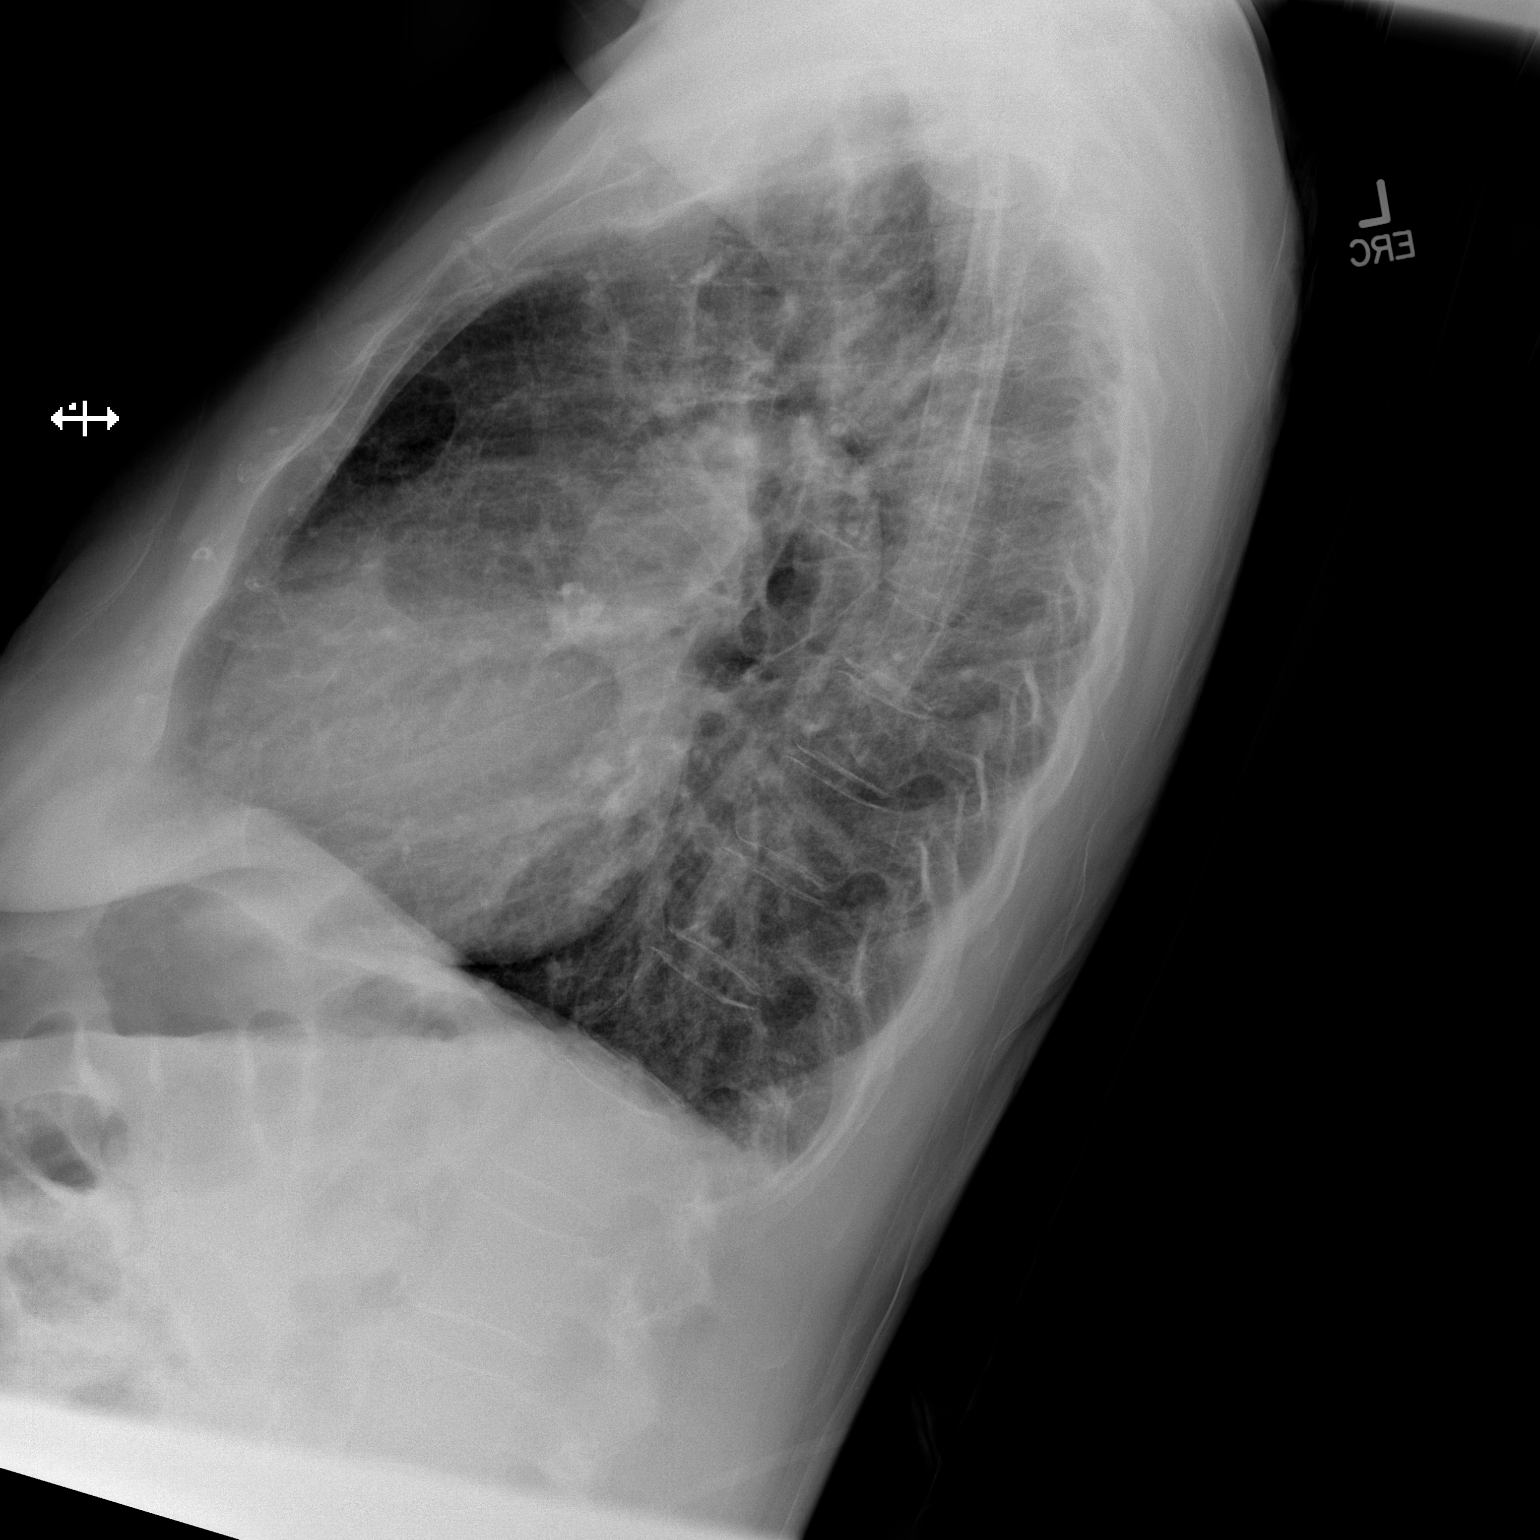

[2 of 2 positions shown; findings below may reference images not displayed]

FINDINGS: The lungs are well-aerated. Vascular congestion is noted, without
significant pulmonary edema. Mild patchy bilateral airspace
opacities could reflect mild pneumonia. There is no evidence of
pleural effusion or pneumothorax.

The heart is borderline normal in size; the mediastinal contour is
within normal limits. No acute osseous abnormalities are seen.
IMPRESSION: Vascular congestion, without significant pulmonary edema. Mild
patchy bilateral airspace opacities could reflect mild pneumonia.

## 2015-04-24 IMAGING — US US EXTREM UP VENOUS*L*
1 series · 14 of 24 positions shown · non-contrast
Comparison: None.

CLINICAL DATA: Left arm swelling.

EXAM:
LEFT UPPER EXTREMITY VENOUS DOPPLER ULTRASOUND
TECHNIQUE: Gray-scale sonography with graded compression, as well as color
Doppler and duplex ultrasound were performed to evaluate the upper
extremity deep venous system from the level of the subclavian vein
and including the jugular, axillary, basilic and upper cephalic
vein. Spectral Doppler was utilized to evaluate flow at rest and
with distal augmentation maneuvers.

[Series 1: us extrem up venous*left* · 0.09mm/px · 14 of 36 slices shown]
[im 1/36]
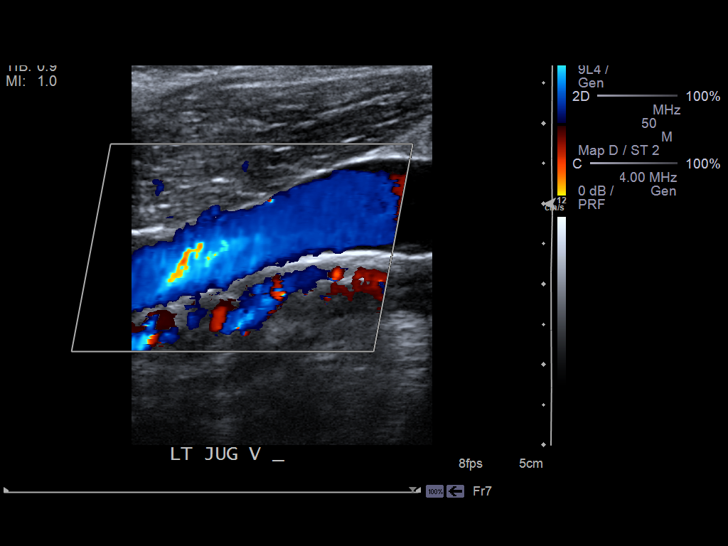
[im 4/36]
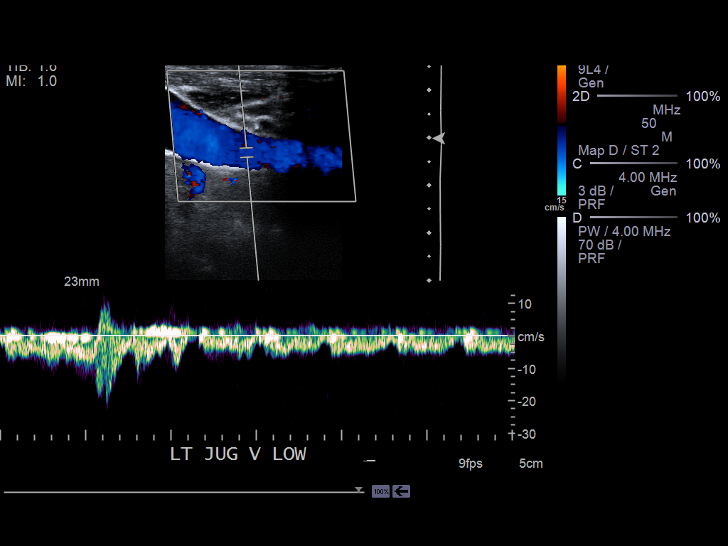
[im 7/36]
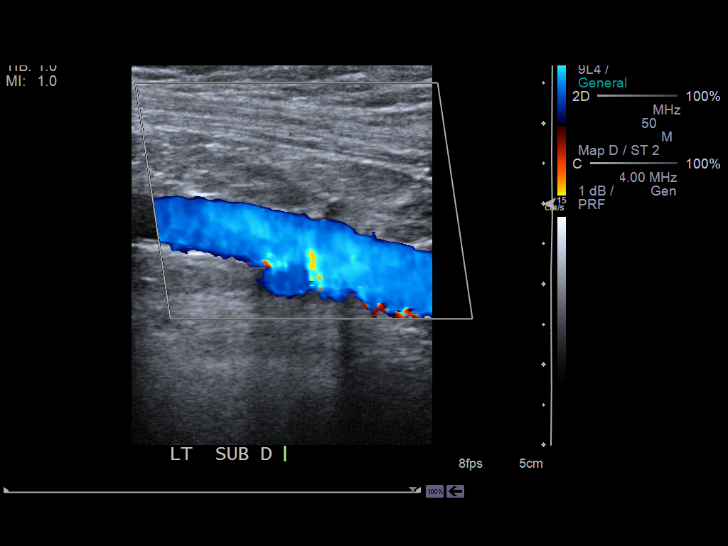
[im 10/36]
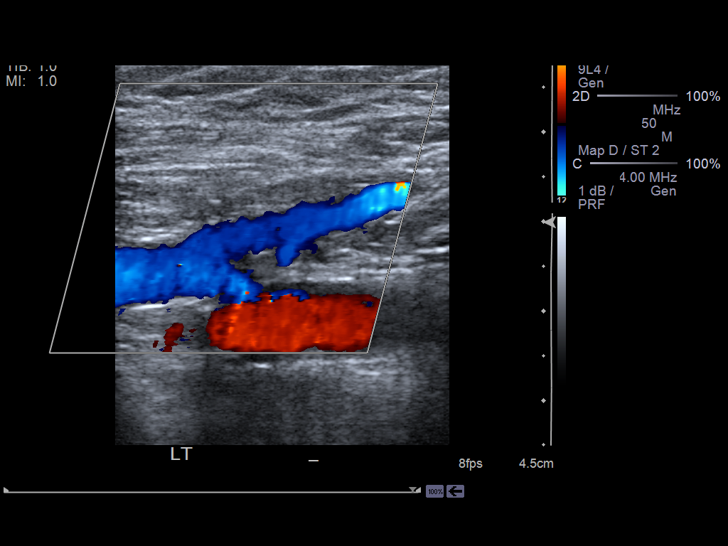
[im 11/36]
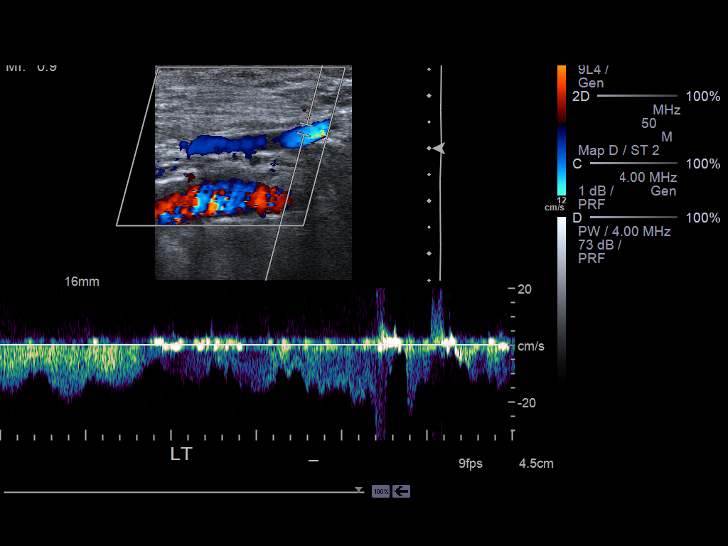
[im 14/36]
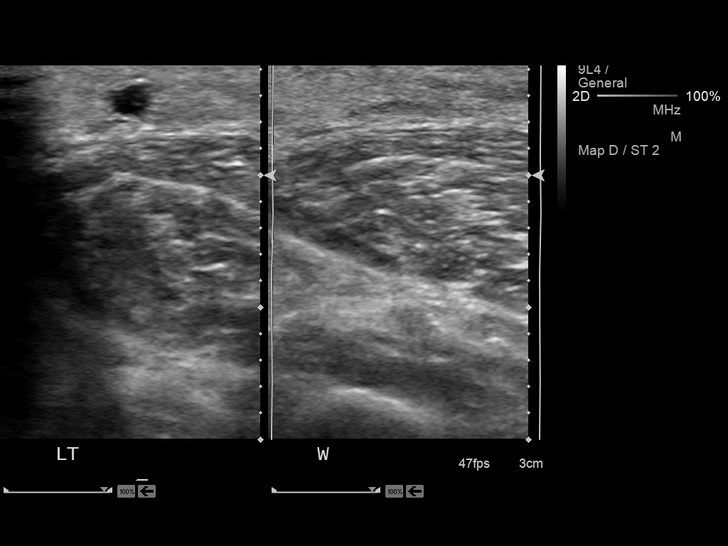
[im 17/36]
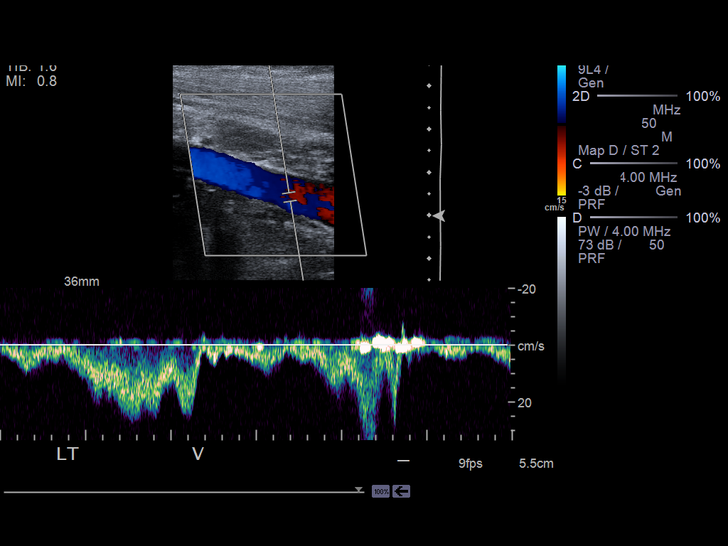
[im 19/36]
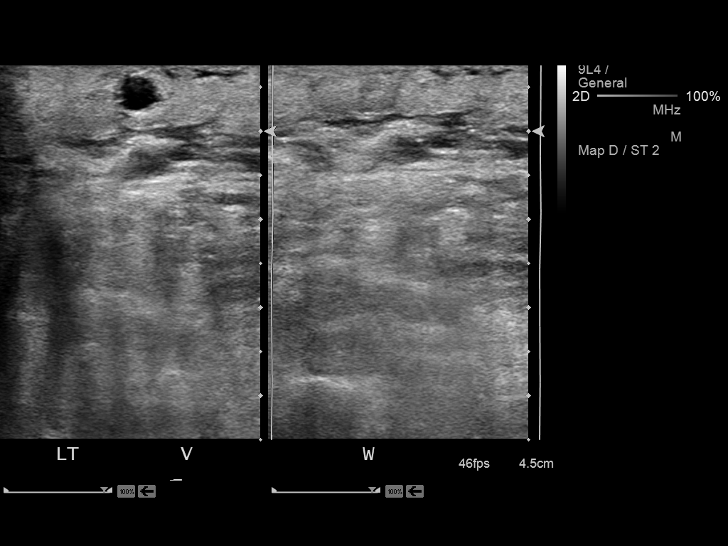
[im 22/36]
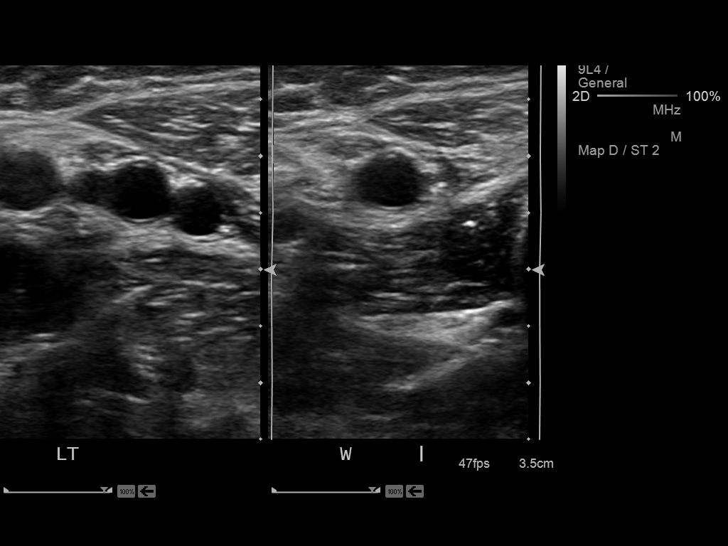
[im 25/36]
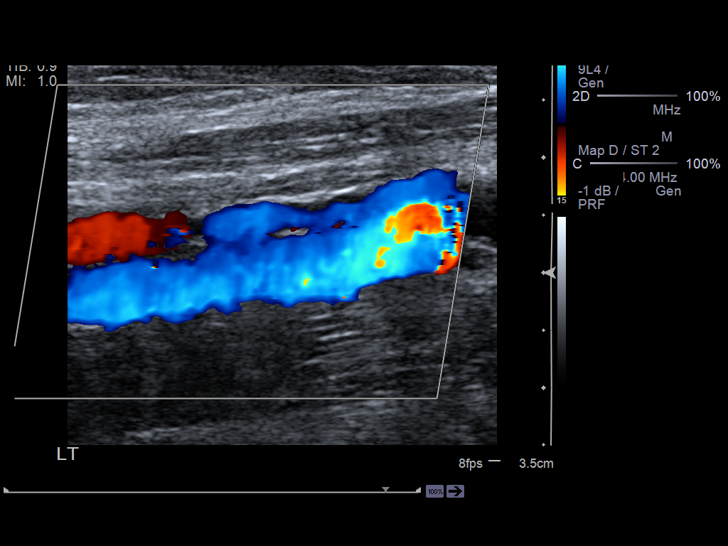
[im 28/36]
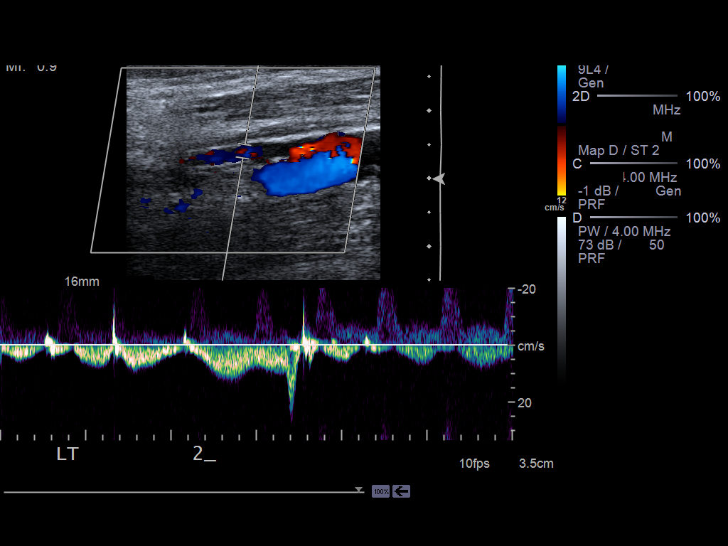
[im 29/36]
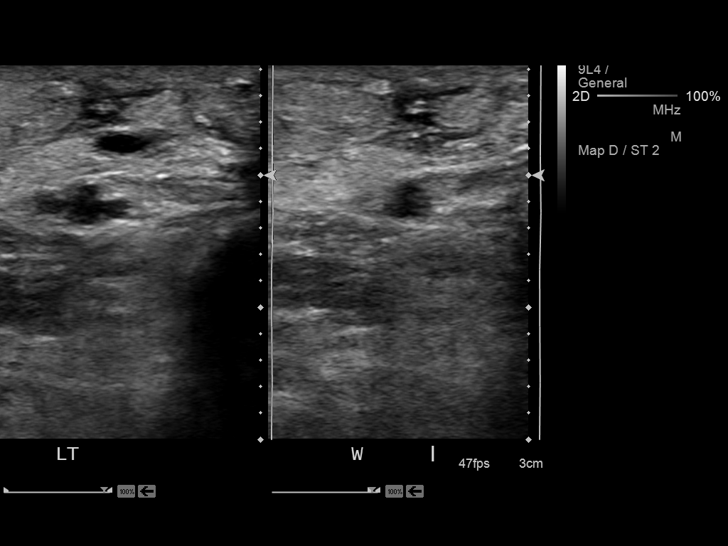
[im 32/36]
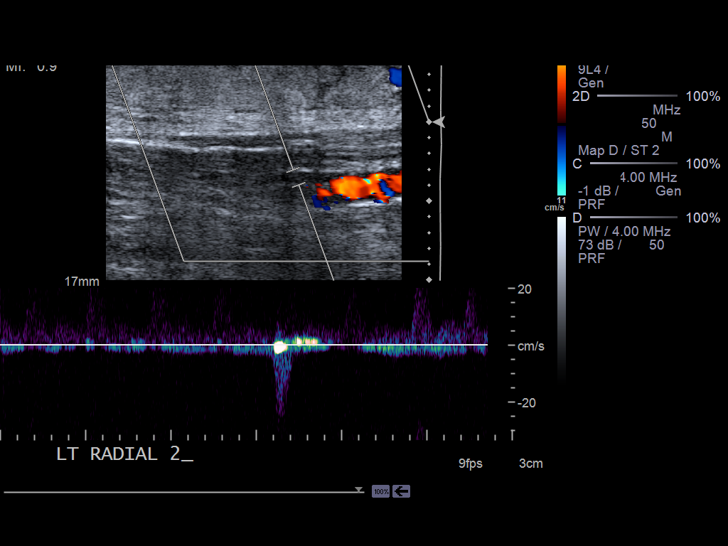
[im 36/36]
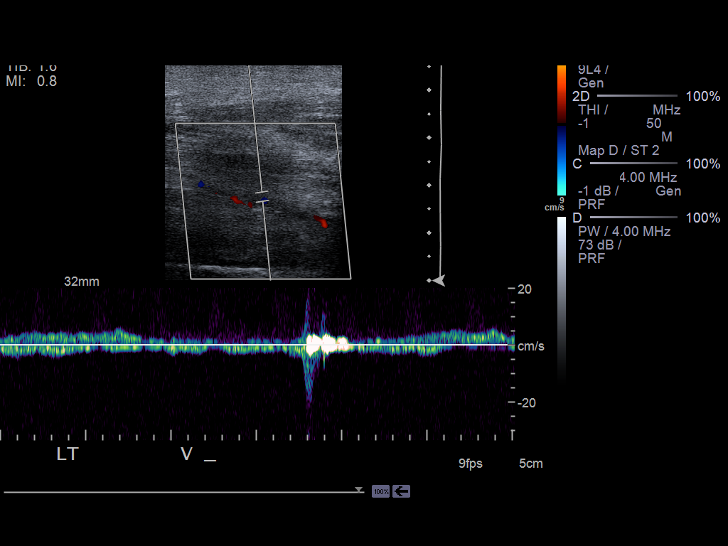

[14 of 24 positions shown; findings below may reference images not displayed]

FINDINGS: Thrombus within deep veins:  None visualized.

Compressibility of deep veins:  Normal.

Duplex waveform respiratory phasicity:  Normal.

Duplex waveform response to augmentation:  Normal.

Venous reflux:  None visualized.

Other findings:  None visualized.
IMPRESSION: No evidence of deep venous thrombosis within the left upper
extremity.

## 2015-04-25 IMAGING — CT CT HUMERUS*L* W/CM
3 of 4 series · 11 of 34 positions shown, 13 images · IV contrast (isovue)
Comparison: US EXTREM UP VENOUS*L* dated 12/07/2013; CT FOREARM*L*
W/CM dated 12/08/2013; DG CHEST 2V dated 12/06/2013

CLINICAL DATA: Left upper extremity swelling.

EXAM:
CT OF THE LEFT HUMERUS WITH CONTRAST
TECHNIQUE: Multidetector CT imaging was performed following the standard
protocol during bolus administration of intravenous contrast.
CONTRAST:  100 mL Isovue 370.

[Series 7: left arm st coronal · coronal · 0.40mm/px · 3 of 87 slices shown]
[im 18/87  bone]
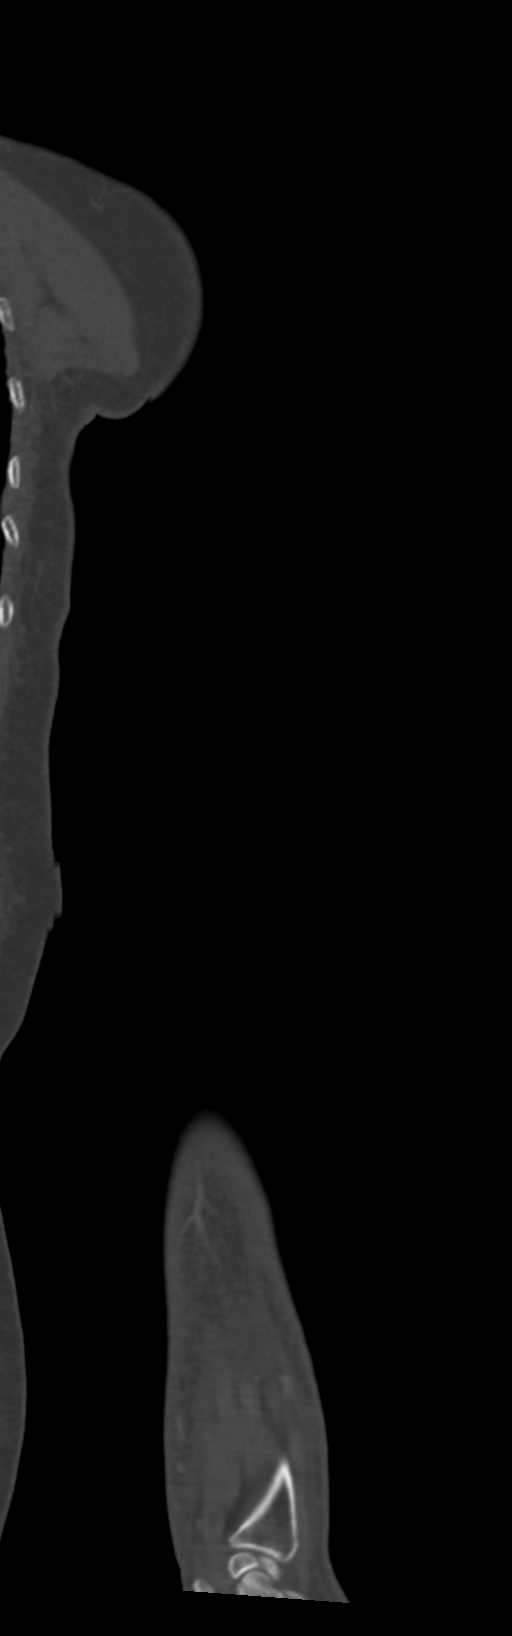
[im 35/87  bone]
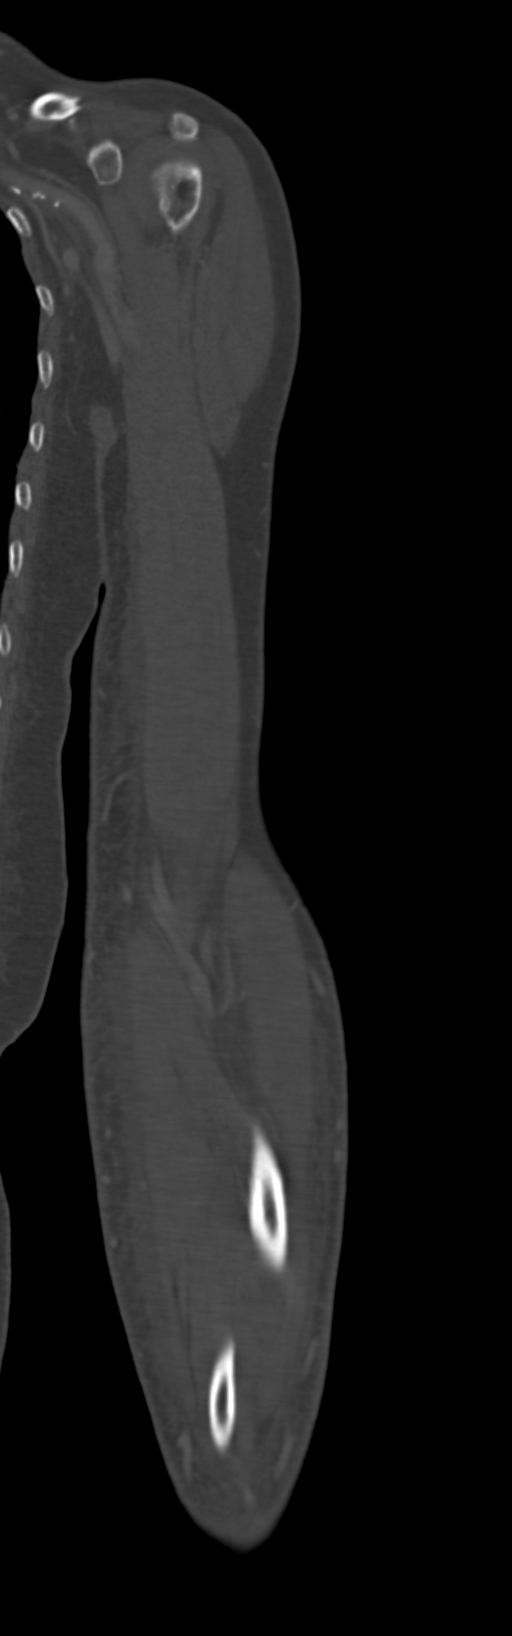
[im 52/87  bone]
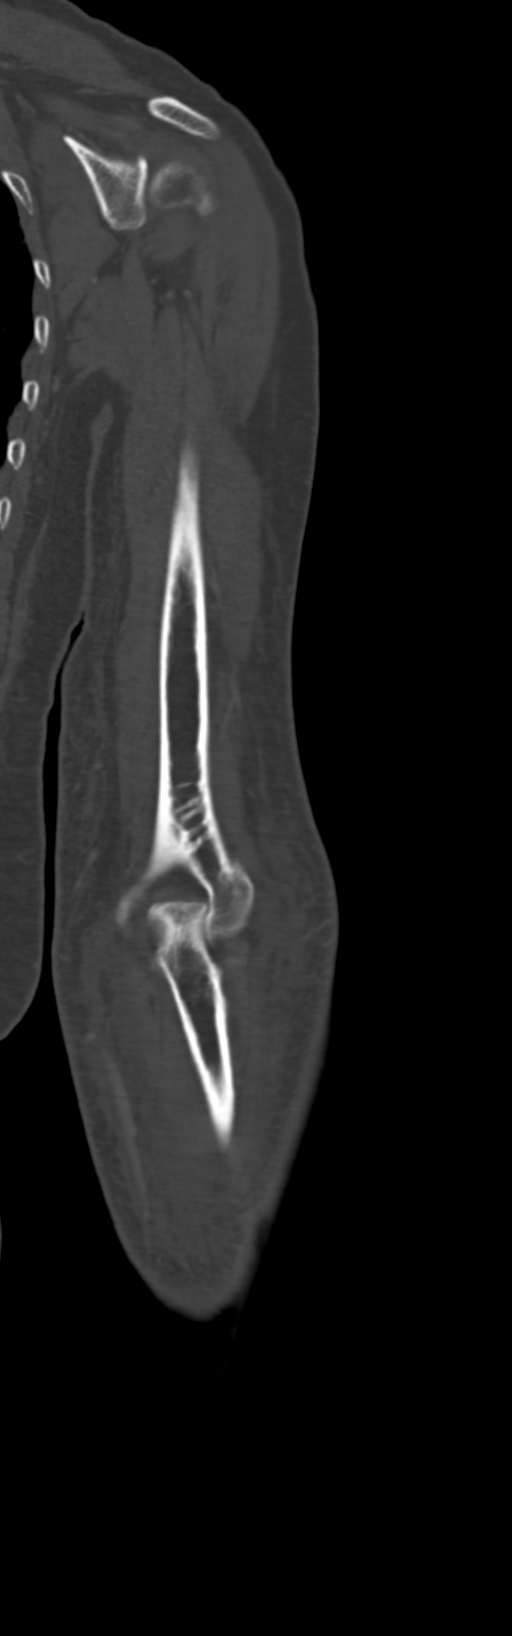

[Series 9: left arm st axial · axial · 0.74mm/px · z∈[-1002,-783]mm · 3 of 329 slices shown]
[im 55/329  bone]
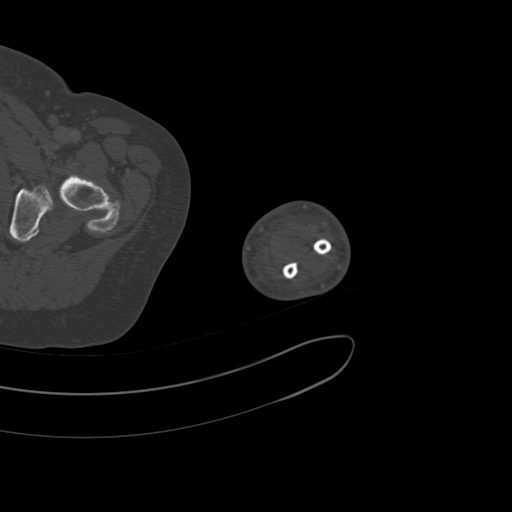
[im 110/329  bone]
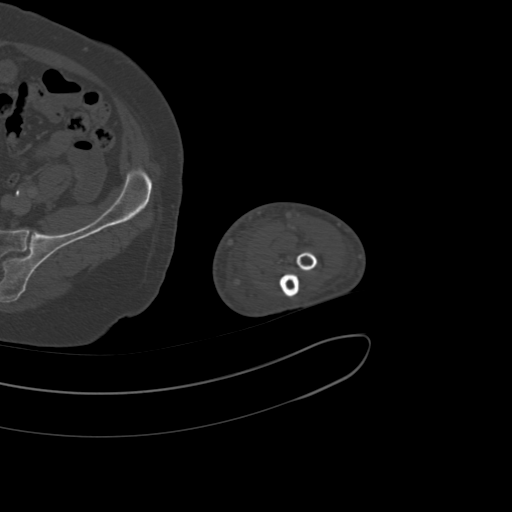
[im 165/329  bone]
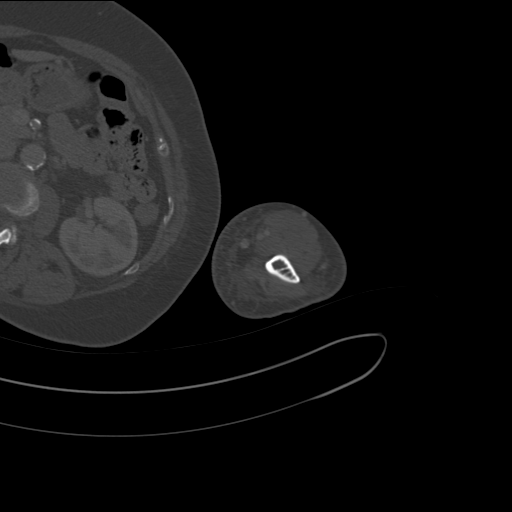

[Series 14: left arm bone axial · axial · 0.45mm/px · z∈[-995,-574]mm · 5 of 318 slices shown, 7 images]
[im 53/318  soft-tissue]
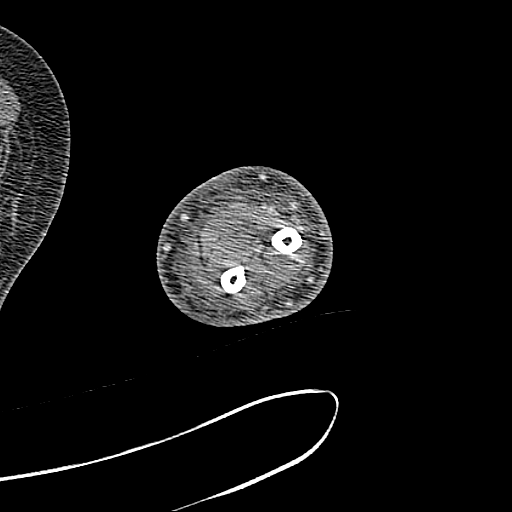
[im 53/318  bone]
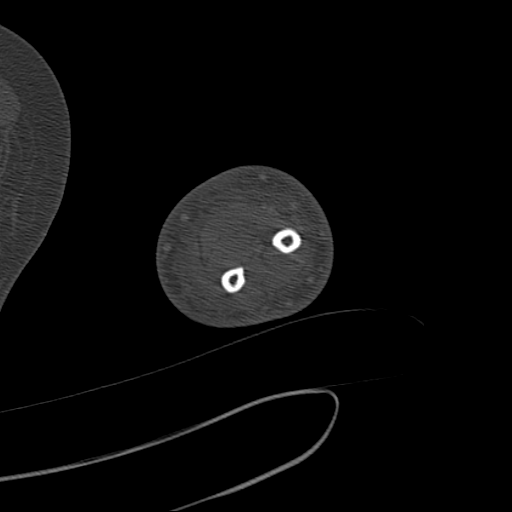
[im 106/318  bone]
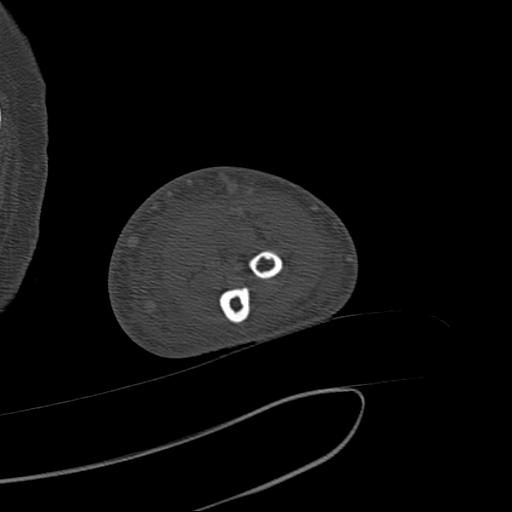
[im 159/318  bone]
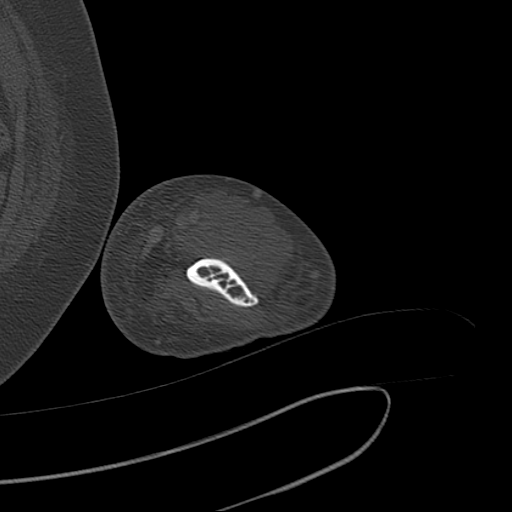
[im 212/318  bone]
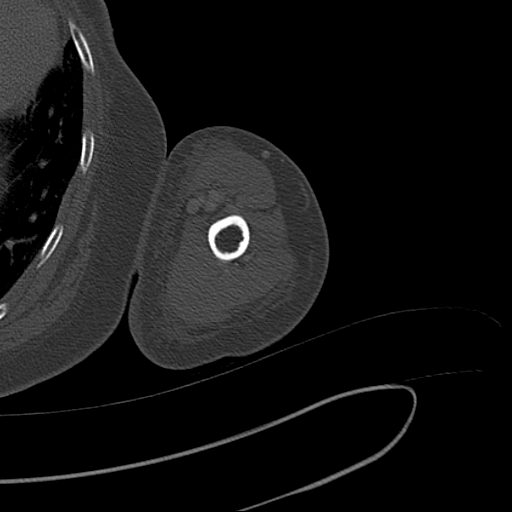
[im 265/318  soft-tissue]
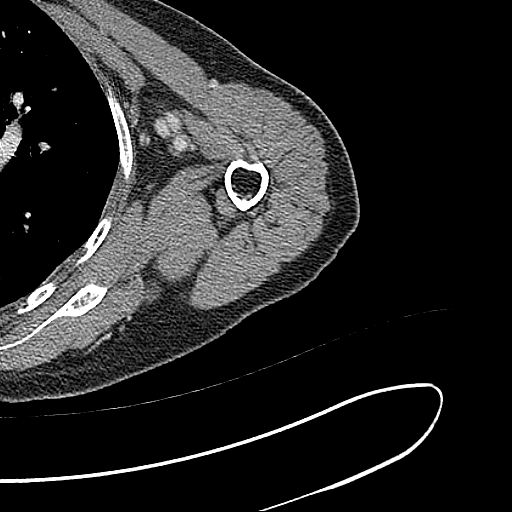
[im 265/318  bone]
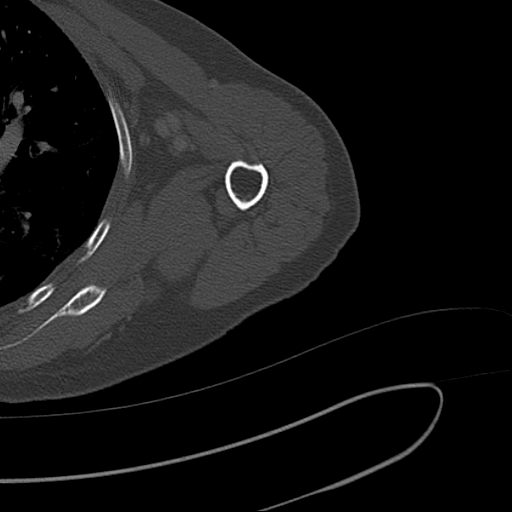

[11 of 34 positions shown; findings below may reference images not displayed]

FINDINGS: Paraseptal emphysema is incidentally noted in the lungs.
Micronodular opacity is present in the right lung, partially
visualized. Bronchopneumonia is in the differential considerations.
Consider followup chest CT.

Atherosclerosis and coronary artery disease noted. There is no
axillary adenopathy. Shoulder girdle musculature appears within
normal limits. No gross glenohumeral effusion. Infiltration is
present in the upper arm subcutaneous tissues, slightly more
prominent dependently. No cause is identified.

The humerus is within normal limits. Neurovascular bundles appear
normal.
IMPRESSION: Subcutaneous infiltration of the distal upper arm. The appearance is
nonspecific. This can be associated with cellulitis, chronic venous
insufficiency and lymphedema among other causes.

## 2015-06-10 ENCOUNTER — Other Ambulatory Visit: Payer: Self-pay | Admitting: *Deleted

## 2015-06-10 DIAGNOSIS — C3490 Malignant neoplasm of unspecified part of unspecified bronchus or lung: Secondary | ICD-10-CM

## 2015-06-13 ENCOUNTER — Inpatient Hospital Stay: Payer: BLUE CROSS/BLUE SHIELD | Admitting: Oncology

## 2015-06-13 ENCOUNTER — Inpatient Hospital Stay: Payer: BLUE CROSS/BLUE SHIELD | Attending: Oncology

## 2015-07-11 ENCOUNTER — Telehealth: Payer: Self-pay | Admitting: *Deleted

## 2015-07-11 MED ORDER — LOSARTAN POTASSIUM-HCTZ 100-25 MG PO TABS: 1.0000 | ORAL_TABLET | Freq: Every day | ORAL | Status: DC

## 2015-07-11 NOTE — Telephone Encounter (Signed)
Escribed

## 2015-07-13 ENCOUNTER — Ambulatory Visit: Payer: BLUE CROSS/BLUE SHIELD | Attending: Radiation Oncology | Admitting: Radiation Oncology

## 2015-07-18 IMAGING — MG MM MAMMO SCREENING UNILAT*R*
1 series · 2 of 2 positions shown · non-contrast
Comparison: Previous exam(s)

CLINICAL DATA: Screening.

EXAM:
DIGITAL SCREENING UNILATERAL RIGHT MAMMOGRAM

[R CC · right · 2 of 2 slices shown]
[im 1/2]
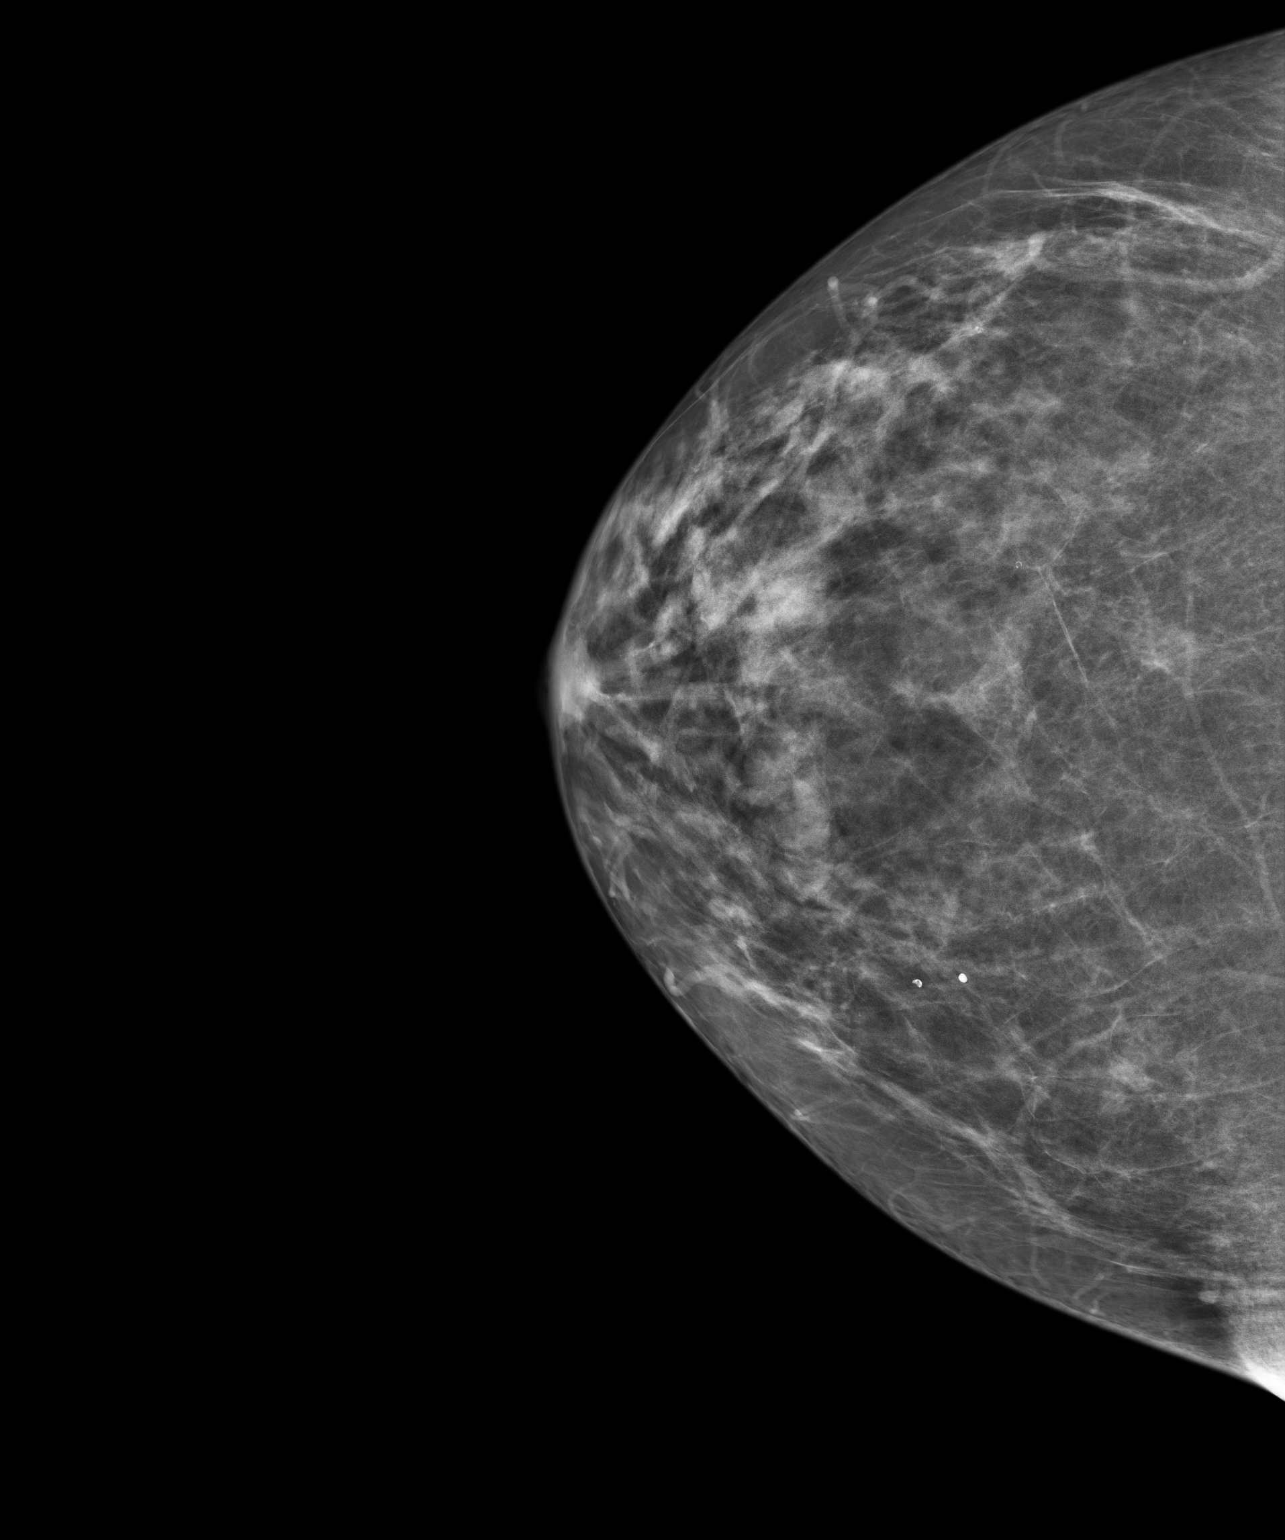
[im 2/2]
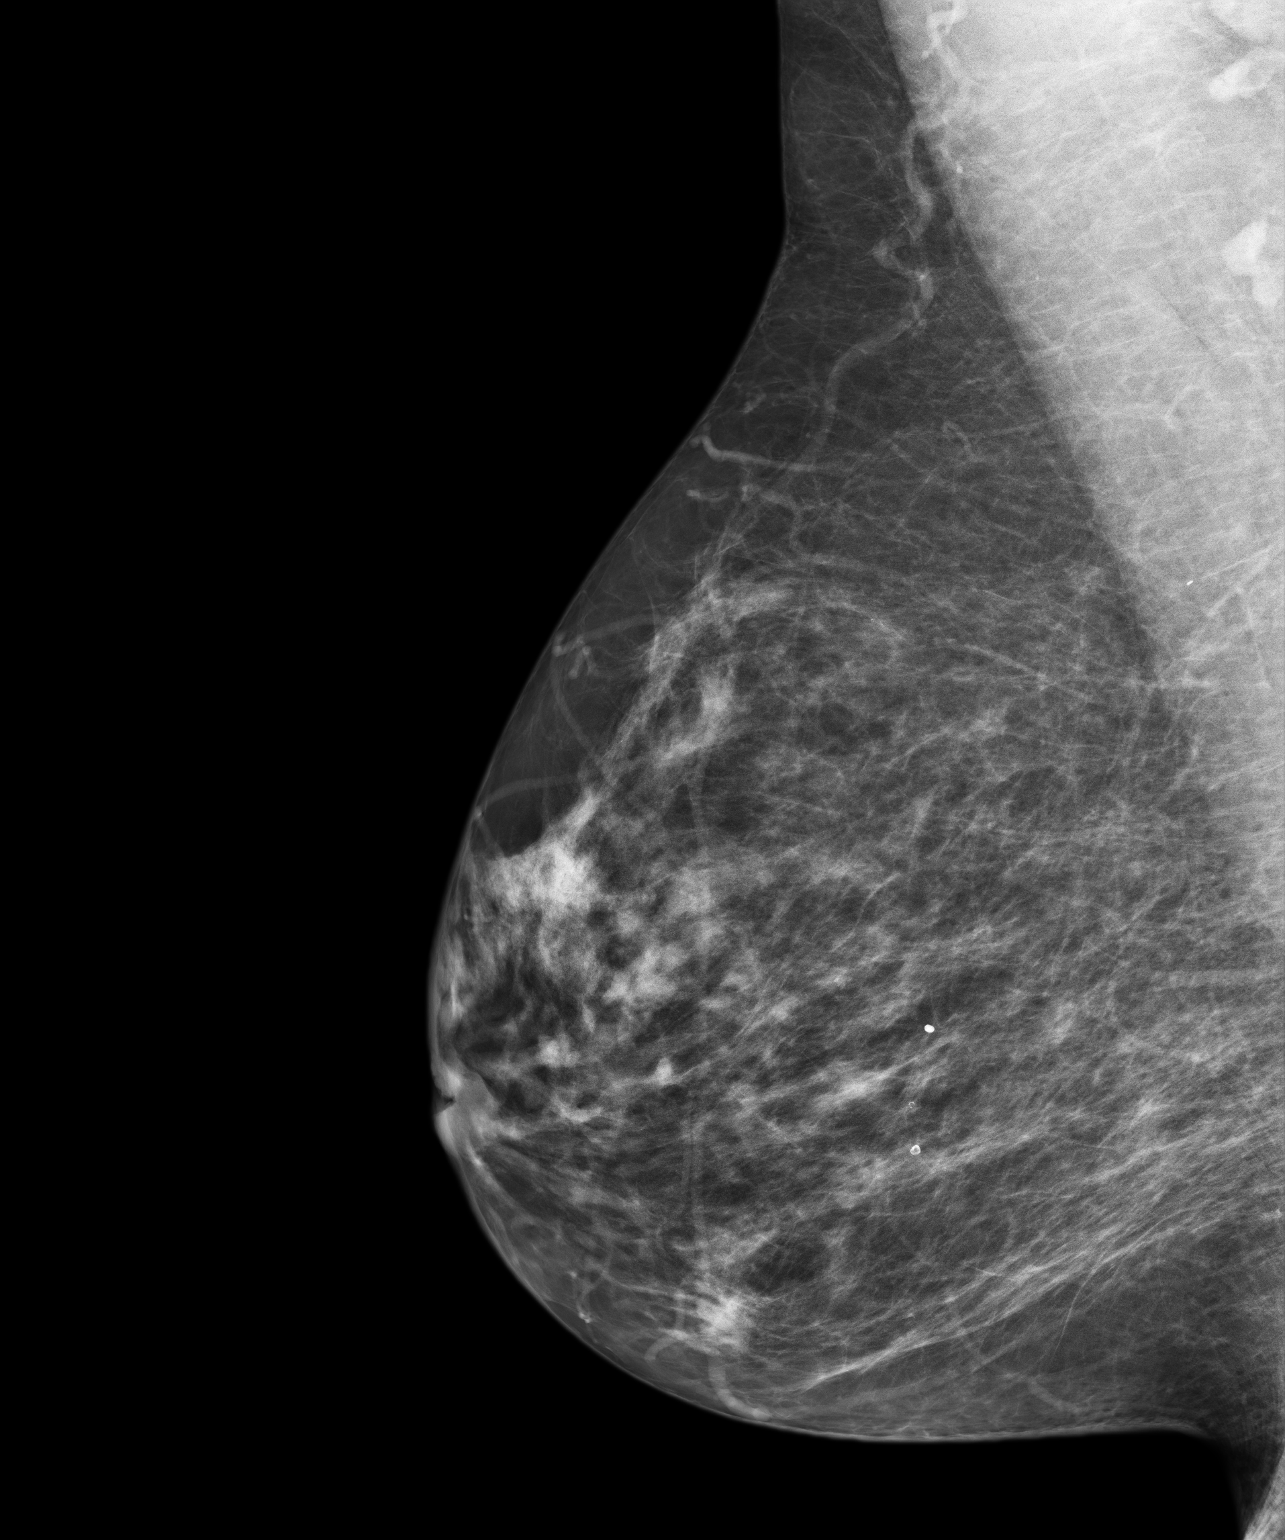

[2 of 2 positions shown; findings below may reference images not displayed]

ACR Breast Density Category b: There are scattered areas of
fibroglandular density.
FINDINGS: The patient has had a left mastectomy. There are no findings
suspicious for malignancy.
IMPRESSION: No mammographic evidence of malignancy. A result letter of this
screening mammogram will be mailed directly to the patient.

RECOMMENDATION:
Screening mammogram in one year. (Code:[id number])

BI-RADS CATEGORY  1: Negative.

## 2015-07-23 ENCOUNTER — Emergency Department
Admission: EM | Admit: 2015-07-23 | Discharge: 2015-07-23 | Disposition: A | Discharge status: Home / Self Care | Payer: BLUE CROSS/BLUE SHIELD | Attending: Emergency Medicine | Admitting: Emergency Medicine

## 2015-07-23 ENCOUNTER — Encounter: Payer: Self-pay | Admitting: Emergency Medicine

## 2015-07-23 DIAGNOSIS — M25512 Pain in left shoulder: Secondary | ICD-10-CM | POA: Diagnosis present

## 2015-07-23 DIAGNOSIS — M7071 Other bursitis of hip, right hip: Secondary | ICD-10-CM

## 2015-07-23 DIAGNOSIS — Z79899 Other long term (current) drug therapy: Secondary | ICD-10-CM | POA: Insufficient documentation

## 2015-07-23 DIAGNOSIS — M719 Bursopathy, unspecified: Secondary | ICD-10-CM | POA: Diagnosis not present

## 2015-07-23 DIAGNOSIS — Z87891 Personal history of nicotine dependence: Secondary | ICD-10-CM | POA: Diagnosis not present

## 2015-07-23 DIAGNOSIS — M7542 Impingement syndrome of left shoulder: Secondary | ICD-10-CM

## 2015-07-23 HISTORY — DX: Unspecified osteoarthritis, unspecified site: M19.90

## 2015-07-23 HISTORY — DX: Solitary pulmonary nodule: R91.1

## 2015-07-23 MED ORDER — CYCLOBENZAPRINE HCL 5 MG PO TABS: 5.0000 mg | ORAL_TABLET | Freq: Every day | ORAL | Status: DC

## 2015-07-23 MED ORDER — CELECOXIB 200 MG PO CAPS: 200.0000 mg | ORAL_CAPSULE | Freq: Every day | ORAL | Status: DC

## 2015-07-23 NOTE — ED Notes (Signed)
Discharge instructions, medications, and follow-up care reviewed with patient. No questions or concerns at this time. Pt stable at discharge.

## 2015-07-23 NOTE — ED Notes (Signed)
Pt c/o not being able to lift L arm above her head without pain, and also c/o pain to R hip that prevents her from laying on it. Pt denies injury, states hx of arthritis.

## 2015-07-23 NOTE — Discharge Instructions (Signed)
Bursitis Bursitis is inflammation and irritation of a bursa, which is one of the small, fluid-filled sacs that cushion and protect the moving parts of your body. These sacs are located between bones and muscles, muscle attachments, or skin areas next to bones. A bursa protects these structures from the wear and tear that results from frequent movement. An inflamed bursa causes pain and swelling. Fluid may build up inside the sac. Bursitis is most common near joints, especially the knees, elbows, hips, and shoulders. CAUSES Bursitis can be caused by:   Injury from:  A direct blow, like falling on your knee or elbow.  Overuse of a joint (repetitive stress).  Infection. This can happen if bacteria gets into a bursa through a cut or scrape near a joint.  Diseases that cause joint inflammation, such as gout and rheumatoid arthritis. RISK FACTORS You may be at risk for bursitis if you:   Have a job or hobby that involves a lot of repetitive stress on your joints.  Have a condition that weakens your body's defense system (immune system), such as diabetes, cancer, or HIV.  Lift and reach overhead often.  Kneel or lean on hard surfaces often.  Run or walk often. SIGNS AND SYMPTOMS The most common signs and symptoms of bursitis are:  Pain that gets worse when you move the affected body part or put weight on it.  Inflammation.  Stiffness. Other signs and symptoms may include:  Redness.  Tenderness.  Warmth.  Pain that continues after rest.  Fever and chills. This may occur in bursitis caused by infection. DIAGNOSIS Bursitis may be diagnosed by:   Medical history and physical exam.  MRI.  A procedure to drain fluid from the bursa with a needle (aspiration). The fluid may be checked for signs of infection or gout.  Blood tests to rule out other causes of inflammation. TREATMENT  Bursitis can usually be treated at home with rest, ice, compression, and elevation (RICE). For  mild bursitis, RICE treatment may be all you need. Other treatments may include:  Nonsteroidal anti-inflammatory drugs (NSAIDs) to treat pain and inflammation.  Corticosteroids to fight inflammation. You may have these drugs injected into and around the area of bursitis.  Aspiration of bursitis fluid to relieve pain and improve movement.  Antibiotic medicine to treat an infected bursa.  A splint, brace, or walking aid.  Physical therapy if you continue to have pain or limited movement.  Surgery to remove a damaged or infected bursa. This may be needed if you have a very bad case of bursitis or if other treatments have not worked. HOME CARE INSTRUCTIONS   Take medicines only as directed by your health care provider.  If you were prescribed an antibiotic medicine, finish it all even if you start to feel better.  Rest the affected area as directed by your health care provider.  Keep the area elevated.  Avoid activities that make pain worse.  Apply ice to the injured area:  Place ice in a plastic bag.  Place a towel between your skin and the bag.  Leave the ice on for 20 minutes, 2-3 times a day.  Use splints, braces, pads, or walking aids as directed by your health care provider.  Keep all follow-up visits as directed by your health care provider. This is important. PREVENTION   Wear knee pads if you kneel often.  Wear sturdy running or walking shoes that fit you well.  Take regular breaks from repetitive activity.  Warm  up by stretching before doing any strenuous activity.  Maintain a healthy weight or lose weight as recommended by your health care provider. Ask your health care provider if you need help.  Exercise regularly. Start any new physical activity gradually. SEEK MEDICAL CARE IF:   Your bursitis is not responding to treatment or home care.  You have a fever.  You have chills.   This information is not intended to replace advice given to you by your  health care provider. Make sure you discuss any questions you have with your health care provider.   Document Released: 09/28/2000 Document Revised: 06/22/2015 Document Reviewed: 12/21/2013 Elsevier Interactive Patient Education 2016 [place] Cuff Injury Rotator cuff injury is any type of injury to the set of muscles and tendons that make up the stabilizing unit of your shoulder. This unit holds the ball of your upper arm bone (humerus) in the socket of your shoulder blade (scapula).  CAUSES Injuries to your rotator cuff most commonly come from sports or activities that cause your arm to be moved repeatedly over your head. Examples of this include throwing, weight lifting, swimming, or racquet sports. Long lasting (chronic) irritation of your rotator cuff can cause soreness and swelling (inflammation), bursitis, and eventual damage to your tendons, such as a tear (rupture). SIGNS AND SYMPTOMS Acute rotator cuff tear:  Sudden tearing sensation followed by severe pain shooting from your upper shoulder down your arm toward your elbow.  Decreased range of motion of your shoulder because of pain and muscle spasm.  Severe pain.  Inability to raise your arm out to the side because of pain and loss of muscle power (large tears). Chronic rotator cuff tear:  Pain that usually is worse at night and may interfere with sleep.  Gradual weakness and decreased shoulder motion as the pain worsens.  Decreased range of motion. Rotator cuff tendinitis:  Deep ache in your shoulder and the outside upper arm over your shoulder.  Pain that comes on gradually and becomes worse when lifting your arm to the side or turning it inward. DIAGNOSIS Rotator cuff injury is diagnosed through a medical history, physical exam, and imaging exam. The medical history helps determine the type of rotator cuff injury. Your health care provider will look at your injured shoulder, feel the injured area, and ask  you to move your shoulder in different positions. X-ray exams typically are done to rule out other causes of shoulder pain, such as fractures. MRI is the exam of choice for the most severe shoulder injuries because the images show muscles and tendons.  TREATMENT  Chronic tear:  Medicine for pain, such as acetaminophen or ibuprofen.  Physical therapy and range-of-motion exercises may be helpful in maintaining shoulder function and strength.  Steroid injections into your shoulder joint.  Surgical repair of the rotator cuff if the injury does not heal with noninvasive treatment. Acute tear:  Anti-inflammatory medicines such as ibuprofen and naproxen to help reduce pain and swelling.  A sling to help support your arm and rest your rotator cuff muscles. Long-term use of a sling is not advised. It may cause significant stiffening of the shoulder joint.  Surgery may be considered within a few weeks, especially in younger, active people, to return the shoulder to full function.  Indications for surgical treatment include the following:  Age younger than 66 years.  Rotator cuff tears that are complete.  Physical therapy, rest, and anti-inflammatory medicines have been used for 6-8 weeks, with no  improvement.  Employment or sporting activity that requires constant shoulder use. Tendinitis:  Anti-inflammatory medicines such as ibuprofen and naproxen to help reduce pain and swelling.  A sling to help support your arm and rest your rotator cuff muscles. Long-term use of a sling is not advised. It may cause significant stiffening of the shoulder joint.  Severe tendinitis may require:  Steroid injections into your shoulder joint.  Physical therapy.  Surgery. HOME CARE INSTRUCTIONS   Apply ice to your injury:  Put ice in a plastic bag.  Place a towel between your skin and the bag.  Leave the ice on for 20 minutes, 2-3 times a day.  If you have a shoulder immobilizer (sling and  straps), wear it until told otherwise by your health care provider.  You may want to sleep on several pillows or in a recliner at night to lessen swelling and pain.  Only take over-the-counter or prescription medicines for pain, discomfort, or fever as directed by your health care provider.  Do simple hand squeezing exercises with a soft rubber ball to decrease hand swelling. SEEK MEDICAL CARE IF:   Your shoulder pain increases, or new pain or numbness develops in your arm, hand, or fingers.  Your hand or fingers are colder than your other hand. SEEK IMMEDIATE MEDICAL CARE IF:   Your arm, hand, or fingers are numb or tingling.  Your arm, hand, or fingers are increasingly swollen and painful, or they turn white or blue. MAKE SURE YOU:  Understand these instructions.  Will watch your condition.  Will get help right away if you are not doing well or get worse.   This information is not intended to replace advice given to you by your health care provider. Make sure you discuss any questions you have with your health care provider.   Document Released: 09/28/2000 Document Revised: 10/06/2013 Document Reviewed: 05/13/2013 Elsevier Interactive Patient Education 2016 [place] the prescription meds as directed.  Follow-up with Dr. Marry Guan as needed for ongoing symptoms.

## 2015-07-24 NOTE — ED Provider Notes (Signed)
[facility] Emergency Department Provider Note ____________________________________________  Time seen: 0858  I have reviewed the triage vital signs and the nursing notes.  HISTORY  Chief Complaint  Arm Pain and Hip Pain  HPI Danielle [name] is a 59 y.o. female Reports to the ED for evaluation and management of symptoms to her left shoulder that is similar to symptoms she had a prior right shoulder rotator cuff tear.she denies any recent injury or trauma to left arm, but notes some decreased ability to raise her arm above shoulder level without pain and disability.he reports having to use her right hand to raise her left shoulder above about 90. She has not seen her primary care provider for this current complaint.she also notes some increased discomfort to the right hip that does not allow her to lie on that side. She localizes the discomfort to the lateral aspect of the left hip and thigh. She also is without any recent injury or trauma to the left hip or back. She denies any bladder or bowel incontinence, leg weakness, or foot drop.she rates the pain overall at an 8/10 in triage.  Past Medical History  Diagnosis Date  . Cancer ([place]) left     breast cancer 2000  . Status post chemotherapy 2001    left breast cancer  . Status post radiation therapy 2001    left breast cancer  . Arthritis   . Lung nodule     There are no active problems to display for this patient.   Past Surgical History  Procedure Laterality Date  . Mastectomy Left     2000  . Lung biopsy    . Breast biospy    . Rotator cuff repair      Current Outpatient Rx  Name  Route  Sig  Dispense  Refill  . celecoxib (CELEBREX) 200 MG capsule   Oral   Take 1 capsule (200 mg total) by mouth daily.   30 capsule   0   . cyclobenzaprine (FLEXERIL) 5 MG tablet   Oral   Take 1 tablet (5 mg total) by mouth at bedtime.   15 tablet   0   . losartan-hydrochlorothiazide (HYZAAR) 100-25 MG per  tablet   Oral   Take 1 tablet by mouth daily.   30 tablet   0     Allergies Review of patient's allergies indicates no known allergies.  Family History  Problem Relation Age of Onset  . Breast cancer Mother 43  . Breast cancer Paternal Aunt 87    Social History Social History  Substance Use Topics  . Smoking status: Former Research scientist (life sciences)  . Smokeless tobacco: Never Used  . Alcohol Use: No    Review of Systems  Constitutional: Negative for fever. Eyes: Negative for visual changes. ENT: Negative for sore throat. Cardiovascular: Negative for chest pain. Respiratory: Negative for shortness of breath. Gastrointestinal: Negative for abdominal pain, vomiting and diarrhea. Genitourinary: Negative for dysuria. Musculoskeletal: Negative for back pain. Left shoulder pain and disability. Right hip pain. Skin: Negative for rash. Neurological: Negative for headaches, focal weakness or numbness. ____________________________________________  PHYSICAL EXAM:  VITAL SIGNS: ED Triage Vitals  Enc Vitals Group     BP 07/23/15 0841 176/102 mmHg     Pulse --      Resp 07/23/15 0841 18     Temp 07/23/15 0841 98.2 F (36.8 C)     Temp Source 07/23/15 0841 Oral     SpO2 07/23/15 0841 93 %  Weight 07/23/15 0841 140 lb (63.504 kg)     Height 07/23/15 0841 '5\' 6"'$  (1.676 m)     Head Cir --      Peak Flow --      Pain Score 07/23/15 0842 6     Pain Loc --      Pain Edu? --      Excl. in [place]? --    Constitutional: Alert and oriented. Well appearing and in no distress. Head: Normocephalic and atraumatic.      Eyes: Conjunctivae are normal. PERRL. Normal extraocular movements      Ears: Canals clear. TMs intact bilaterally.   Nose: No congestion/rhinorrhea.   Mouth/Throat: Mucous membranes are moist.   Neck: Supple. No thyromegaly. Hematological/Lymphatic/Immunological: No cervical lymphadenopathy. Cardiovascular: Normal rate, regular rhythm.  Respiratory: Normal respiratory  effort. No wheezes/rales/rhonchi. Gastrointestinal: Soft and nontender. No distention. Musculoskeletal: left shoulder without any obvious deformity, sulcus sign, or dislocation. Patient with decreased active range of motion above 80 on the left. She has a positive impingement sign on exam. She also has weakness with rotator cuff testing. Empty can sign is also positive on her left side. Patient with negative seated straight leg raise bilaterally. She has normal hip abduction and adduction strength testing. She demonstrates normal sit to stand transition, and normal toe and heel raise.Nontender with normal range of motion in all extremities.  Neurologic: cranial nerves II through XII grossly intact. Normal LE DTRs bilaterally. Normal composite fist on exam. Normal gait without ataxia. Normal speech and language. No gross focal neurologic deficits are appreciated. Skin:  Skin is warm, dry and intact. No rash noted. Psychiatric: Mood and affect are normal. Patient exhibits appropriate insight and judgment. ____________________________________________   RADIOLOGY deferred ____________________________________________  INITIAL IMPRESSION / ASSESSMENT AND PLAN / ED COURSE  {patient with clinical presentation consistent with a left shoulder rotator cuff impingement syndrome. Patient also with indication of a right hip bursitis. She'll be referred to Dr. Marry Guan for ongoing orthopedic management. She is provided with a prescription for Celebrex and Flexeril, both of which she has had in the past for orthopedic issues. She will follow with her primary care provider for ongoing needs. ____________________________________________  FINAL CLINICAL IMPRESSION(S) / ED DIAGNOSES  Final diagnoses:  Rotator cuff impingement syndrome of left shoulder  Bursitis, hip, right      Melvenia Needles, PA-C 07/24/15 [place], MD 07/24/15 [phone]

## 2015-08-09 ENCOUNTER — Ambulatory Visit
Admission: RE | Admit: 2015-08-09 | Discharge: 2015-08-09 | Disposition: A | Discharge status: Home / Self Care | Payer: BLUE CROSS/BLUE SHIELD | Source: Ambulatory Visit | Attending: Oncology | Admitting: Oncology

## 2015-08-09 DIAGNOSIS — J439 Emphysema, unspecified: Secondary | ICD-10-CM | POA: Diagnosis not present

## 2015-08-09 DIAGNOSIS — C3490 Malignant neoplasm of unspecified part of unspecified bronchus or lung: Secondary | ICD-10-CM

## 2015-08-09 DIAGNOSIS — I2510 Atherosclerotic heart disease of native coronary artery without angina pectoris: Secondary | ICD-10-CM | POA: Diagnosis not present

## 2015-08-09 DIAGNOSIS — C3411 Malignant neoplasm of upper lobe, right bronchus or lung: Secondary | ICD-10-CM | POA: Diagnosis not present

## 2015-08-11 ENCOUNTER — Inpatient Hospital Stay: Payer: BLUE CROSS/BLUE SHIELD | Attending: Oncology | Admitting: Oncology

## 2015-08-11 ENCOUNTER — Encounter: Payer: Self-pay | Admitting: Oncology

## 2015-08-11 ENCOUNTER — Inpatient Hospital Stay: Payer: BLUE CROSS/BLUE SHIELD

## 2015-08-11 VITALS — BP 171/101 | HR 86 | Temp 98.2°F | Ht 66.0 in | Wt 145.1 lb

## 2015-08-11 DIAGNOSIS — C3490 Malignant neoplasm of unspecified part of unspecified bronchus or lung: Secondary | ICD-10-CM

## 2015-08-11 DIAGNOSIS — Z87891 Personal history of nicotine dependence: Secondary | ICD-10-CM | POA: Insufficient documentation

## 2015-08-11 DIAGNOSIS — Z170 Estrogen receptor positive status [ER+]: Secondary | ICD-10-CM | POA: Insufficient documentation

## 2015-08-11 DIAGNOSIS — Z9221 Personal history of antineoplastic chemotherapy: Secondary | ICD-10-CM | POA: Diagnosis not present

## 2015-08-11 DIAGNOSIS — R0609 Other forms of dyspnea: Secondary | ICD-10-CM | POA: Diagnosis not present

## 2015-08-11 DIAGNOSIS — Z9013 Acquired absence of bilateral breasts and nipples: Secondary | ICD-10-CM | POA: Insufficient documentation

## 2015-08-11 DIAGNOSIS — Z79899 Other long term (current) drug therapy: Secondary | ICD-10-CM | POA: Diagnosis not present

## 2015-08-11 DIAGNOSIS — I2510 Atherosclerotic heart disease of native coronary artery without angina pectoris: Secondary | ICD-10-CM | POA: Diagnosis not present

## 2015-08-11 DIAGNOSIS — Z803 Family history of malignant neoplasm of breast: Secondary | ICD-10-CM | POA: Insufficient documentation

## 2015-08-11 DIAGNOSIS — Z853 Personal history of malignant neoplasm of breast: Secondary | ICD-10-CM | POA: Diagnosis not present

## 2015-08-11 DIAGNOSIS — C3411 Malignant neoplasm of upper lobe, right bronchus or lung: Secondary | ICD-10-CM | POA: Diagnosis present

## 2015-08-11 DIAGNOSIS — R918 Other nonspecific abnormal finding of lung field: Secondary | ICD-10-CM

## 2015-08-11 LAB — COMPREHENSIVE METABOLIC PANEL
ALK PHOS: 80 U/L (ref 38–126)
ALT: 19 U/L (ref 14–54)
AST: 27 U/L (ref 15–41)
Albumin: 4.4 g/dL (ref 3.5–5.0)
Anion gap: 5 (ref 5–15)
BILIRUBIN TOTAL: 0.6 mg/dL (ref 0.3–1.2)
BUN: 40 mg/dL — AB (ref 6–20)
CHLORIDE: 105 mmol/L (ref 101–111)
CO2: 25 mmol/L (ref 22–32)
Calcium: 8.4 mg/dL — ABNORMAL LOW (ref 8.9–10.3)
Creatinine, Ser: 1.03 mg/dL — ABNORMAL HIGH (ref 0.44–1.00)
GFR calc Af Amer: >60 mL/min (ref >60)
GFR, EST NON AFRICAN AMERICAN: 57 mL/min — AB (ref >60)
Glucose, Bld: 96 mg/dL (ref 65–99)
Potassium: 4 mmol/L (ref 3.5–5.1)
Sodium: 135 mmol/L (ref 135–145)
TOTAL PROTEIN: 7.9 g/dL (ref 6.5–8.1)

## 2015-08-11 LAB — CBC WITH DIFFERENTIAL/PLATELET
Basophils Absolute: 0.1 10*3/uL (ref 0–0.1)
Basophils Relative: 1 %
Eosinophils Absolute: 0 10*3/uL (ref 0–0.7)
Eosinophils Relative: 1 %
HEMATOCRIT: 34 % — AB (ref 35.0–47.0)
Hemoglobin: 11.2 g/dL — ABNORMAL LOW (ref 12.0–16.0)
LYMPHS ABS: 1.8 10*3/uL (ref 1.0–3.6)
LYMPHS PCT: 36 %
MCH: 30.3 pg (ref 26.0–34.0)
MCHC: 32.8 g/dL (ref 32.0–36.0)
MCV: 92.5 fL (ref 80.0–100.0)
Monocytes Absolute: 0.5 10*3/uL (ref 0.2–0.9)
Monocytes Relative: 10 %
NEUTROS ABS: 2.6 10*3/uL (ref 1.4–6.5)
Neutrophils Relative %: 52 %
Platelets: 230 10*3/uL (ref 150–440)
RBC: 3.67 MIL/uL — AB (ref 3.80–5.20)
RDW: 13.4 % (ref 11.5–14.5)
WBC: 5 10*3/uL (ref 3.6–11.0)

## 2015-08-11 NOTE — Progress Notes (Signed)
[place] @ [facility] [phone]  Fax:([phone]) [place] OB: 03-01-1956  MR#: [id number]  WGY#:[id number]  Patient Care Team: Marguerita Merles, MD as PCP - General (Family Medicine)  CHIEF COMPLAINT:  Chief Complaint  Patient presents with  . Lung Cancer   Subjective: Chief Complaint/Diagnosis:   Chief Complaint/Problem List  1.  Carcinoma of breast, T3N1M0 tumor. ER/PR positive.   2.  Status post Tamoxifen.   3.  Status post modified radical mastectomy in November of 2000.    4.  Status post chemotherapy.   5.  Status post radiation therapy.  6.  On Aromasin starting February of 2005. 7.  Stopped Aromasin in May of 2010 8.abnormal screening CT scan with right upper lobe spiculated nodule and right middle lobe nodule found in November of 2015 PET scan shows hypermetabolic activity in right upper lobe nodule.  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  No cough no chest pain.  Shortness of breath on exertion.  Patient had a previous history of carcinoma of breast and recently had a right upper lobe lung cancer which has been radiated.  Patient had a repeat CT scan.  No hemoptysis.  No weight loss.  Patient has quit smoking 3 years ago REVIEW OF SYSTEMS:   GENERAL:  Feels good.  Active.  No fevers, sweats or weight loss. PERFORMANCE STATUS (ECOG): 01 HEENT:  No visual changes, runny nose, sore throat, mouth sores or tenderness. Lungs: No shortness of breath or cough.  No hemoptysis. Cardiac:  No chest pain, palpitations, orthopnea, or PND. GI:  No nausea, vomiting, diarrhea, constipation, melena or hematochezia. GU:  No urgency, frequency, dysuria, or hematuria. Musculoskeletal:  No back pain.  No joint pain.  No muscle tenderness. Extremities:  No pain or swelling. Skin:  No rashes or  skin changes. Neuro:  No headache, numbness or weakness, balance or coordination issues. Endocrine:  No diabetes, thyroid issues, hot flashes or night sweats. Psych:  No mood changes, depression or anxiety. Pain:  No focal pain. Review of systems:  All other systems reviewed and found to be negative. As per HPI. Otherwise, a complete review of systems is negatve.  PAST MEDICAL HISTORY: Past Medical History  Diagnosis Date  . Cancer ([place]) left     breast cancer 2000  . Status post chemotherapy 2001    left breast cancer  . Status post radiation therapy 2001    left breast cancer  . Arthritis   . Lung nodule     PAST SURGICAL HISTORY: Past Surgical History  Procedure Laterality Date  . Mastectomy Left     2000  . Lung biopsy    . Breast biospy    . Rotator cuff repair      FAMILY HISTORY Family History  Problem Relation Age of Onset  . Breast cancer Mother 54  . Breast cancer Paternal Aunt 34    ADVANCED DIRECTIVES:  No flowsheet data found.  HEALTH MAINTENANCE: Social History  Substance Use Topics  . Smoking status: Former Research scientist (life sciences)  . Smokeless tobacco: Never Used  . Alcohol Use: No      No Known Allergies  Current Outpatient Prescriptions  Medication Sig Dispense Refill  . albuterol (PROAIR HFA) 108 (90 BASE) MCG/ACT inhaler Inhale into the lungs.    [name]  celecoxib (CELEBREX) 200 MG capsule Take 1 capsule (200 mg total) by mouth daily. 30 capsule 0  . citalopram (CELEXA) 20 MG tablet Take by mouth.    . cyclobenzaprine (FLEXERIL) 10 MG tablet Take by mouth.    . losartan-hydrochlorothiazide (HYZAAR) 100-25 MG per tablet Take 1 tablet by mouth daily. 30 tablet 0   No current facility-administered medications for this visit.  Significant History/PMH:   Depression:    lymphedema:    htn:    breast cancer:    Shoulder Surgery - Right:    Mastectomy:   Preventive Screening:  Has patient had any of the following test? Colonscopy  Mammography (1)    Last Colonoscopy: > 5 years(1)   Last Mammography: 01/2014(1)    OBJECTIVE:  Filed Vitals:   08/11/15 1539  BP: 171/101  Pulse: 86  Temp: 98.2 F (36.8 C)     Body mass index is 23.42 kg/(m^2).    ECOG FS:1 - Symptomatic but completely ambulatory  PHYSICAL EXAM: General  status: Performance status is good.  Patient has not lost significant weight. Since last evaluation there is no significant change in the general status HEENT: No evidence of stomatitis. Sclera and conjunctivae :: No jaundice.   pale looking. Lungs: Emphysematous chest.  Diminished air entry on both sides. Lymphatic system: Cervical, axillary, inguinal, lymph nodes not palpable GI: Abdomen is soft.liver and spleen not palpable.  No ascites.  Bowel sounds are normal.  No other palpable masses.  No tenderness . Lower extremity: No edema Neurological system: Higher functions, cranial nerves intact no evidence of peripheral neuropathy. Skin: No rash.  No ecchymosis.. No petechial hemorrhages  Examination of right breast: Chest wall area no evidence of recurrent disease ,the left breast free of masses LAB RESULTS:  CBC Latest Ref Rng 08/11/2015 02/09/2015  WBC 3.6 - 11.0 K/uL 5.0 4.9  Hemoglobin 12.0 - 16.0 g/dL 11.2(L) 11.5(L)  Hematocrit 35.0 - 47.0 % 34.0(L) 34.9(L)  Platelets 150 - 440 K/uL 230 249    Appointment on 08/11/2015  Component Date Value Ref Range Status  . WBC 08/11/2015 5.0  3.6 - 11.0 K/uL Final  . RBC 08/11/2015 3.67* 3.80 - 5.20 MIL/uL Final  . Hemoglobin 08/11/2015 11.2* 12.0 - 16.0 g/dL Final  . HCT 08/11/2015 34.0* 35.0 - 47.0 % Final  . MCV 08/11/2015 92.5  80.0 - 100.0 fL Final  . MCH 08/11/2015 30.3  26.0 - 34.0 pg Final  . MCHC 08/11/2015 32.8  32.0 - 36.0 g/dL Final  . RDW 08/11/2015 13.4  11.5 - 14.5 % Final  . Platelets 08/11/2015 230  150 - 440 K/uL Final  . Neutrophils Relative % 08/11/2015 52   Final  . Neutro Abs 08/11/2015 2.6  1.4 - 6.5 K/uL Final  . Lymphocytes Relative  08/11/2015 36   Final  . Lymphs Abs 08/11/2015 1.8  1.0 - 3.6 K/uL Final  . Monocytes Relative 08/11/2015 10   Final  . Monocytes Absolute 08/11/2015 0.5  0.2 - 0.9 K/uL Final  . Eosinophils Relative 08/11/2015 1   Final  . Eosinophils Absolute 08/11/2015 0.0  0 - 0.7 K/uL Final  . Basophils Relative 08/11/2015 1   Final  . Basophils Absolute 08/11/2015 0.1  0 - 0.1 K/uL Final  . Sodium 08/11/2015 135  135 - 145 mmol/L Final  . Potassium 08/11/2015 4.0  3.5 - 5.1 mmol/L Final  . Chloride 08/11/2015 105  101 - 111 mmol/L Final  . CO2 08/11/2015 25  22 -  32 mmol/L Final  . Glucose, Bld 08/11/2015 96  65 - 99 mg/dL Final  . BUN 08/11/2015 40* 6 - 20 mg/dL Final  . Creatinine, Ser 08/11/2015 1.03* 0.44 - 1.00 mg/dL Final  . Calcium 08/11/2015 8.4* 8.9 - 10.3 mg/dL Final  . Total Protein 08/11/2015 7.9  6.5 - 8.1 g/dL Final  . Albumin 08/11/2015 4.4  3.5 - 5.0 g/dL Final  . AST 08/11/2015 27  15 - 41 U/L Final  . ALT 08/11/2015 19  14 - 54 U/L Final  . Alkaline Phosphatase 08/11/2015 80  38 - 126 U/L Final  . Total Bilirubin 08/11/2015 0.6  0.3 - 1.2 mg/dL Final  . GFR calc non Af Amer 08/11/2015 57* >60 mL/min Final  . GFR calc Af Amer 08/11/2015 >60  >60 mL/min Final   Comment: (NOTE) The eGFR has been calculated using the CKD EPI equation. This calculation has not been validated in all clinical situations. eGFR's persistently <60 mL/min signify possible Chronic Kidney Disease.   . Anion gap 08/11/2015 5  5 - 15 Final       STUDIES: Ct Chest Wo Contrast  08/09/2015  CLINICAL DATA:  Left breast cancer status post mastectomy in 2003. Squamous cell lung carcinoma of the right upper lobe diagnosed in January 2016 presenting for restaging. EXAM: CT CHEST WITHOUT CONTRAST TECHNIQUE: Multidetector CT imaging of the chest was performed following the standard protocol without IV contrast. COMPARISON:  02/07/2015 chest CT. FINDINGS: Mediastinum/Nodes: Normal heart size. No pericardial  fluid/thickening. There is atherosclerosis of the thoracic aorta, the great vessels of the mediastinum and the coronary arteries, including calcified atherosclerotic plaque in the left main, left anterior descending, left circumflex and right coronary arteries. Great vessels are normal in course and caliber. Normal visualized thyroid. Normal esophagus. No pathologically enlarged axillary, mediastinal or gross lymph nodes, noting limited sensitivity for the detection of hilar adenopathy on this noncontrast study. Lungs/Pleura: No pneumothorax. No pleural effusion. Mild-to-moderate centrilobular and paraseptal emphysema with diffuse bronchial wall thickening. There is a 0.7 x 0.7 cm right upper lobe spiculated pulmonary nodule (series 4/ image 21), decreased from 0.9 x 0.6 cm. There is new sharply marginated patchy consolidation and ground-glass opacity in the right upper lobe surrounding the spiculated pulmonary nodule, in keeping with postradiation change. There is a sub-solid lung mass measuring 4.0 x 3.6 cm in total with solid components measuring up to 1.3 cm (series 4/image 29) in the anterior medial right lung spanning the minor fissure with involvement of the basilar right upper lobe and medial right middle lobe, which measured 3.9 x 3.4 cm on 02/07/2015 and 3.1 x 2.6 cm on 09/06/2014 using similar measurement technique, in keeping with continued progression. A separate 5 mm solid right middle lobe pulmonary nodule (4/28) is stable since 09/06/2014. Stable 1.0 cm apical left upper lobe solid pulmonary nodule (4/6), which was non FDG avid on 09/24/2014. Stable minimal radiation fibrosis in the anterior left upper lobe. No new significant pulmonary nodules. No acute consolidative airspace disease. Upper abdomen: Stable 0.7 cm left liver lobe hypodense lesion, too small to characterize. Stable simple 1.3 cm renal cyst in the medial upper left kidney. Musculoskeletal: No aggressive appearing focal osseous lesions.  Status post left mastectomy. IMPRESSION: 1. Partial interval treatment response of the spiculated subcentimeter right upper lobe pulmonary nodule (corresponding with the biopsy-proven squamous cell lung carcinoma), with evolving surrounding postradiation change. 2. Continued progression of a subsolid 4.0 cm lung mass in the anterior medial right lung spanning  the minor fissure, most in keeping with a separate primary lung adenocarcinoma. 3. Separate 5 mm right middle lobe pulmonary nodule, stable for 11 months. 4. Mild-to-moderate centrilobular and paraseptal emphysema with diffuse bronchial wall thickening, suggesting COPD. 5. Atherosclerosis, including left main and 3 vessel coronary artery disease. Please note that although the presence of coronary artery calcium documents the presence of coronary artery disease, the severity of this disease and any potential stenosis cannot be assessed on this non-gated CT examination. Electronically Signed   By: Ilona Sorrel M.D.   On: 08/09/2015 10:56    ASSESSMENT: Right upper lobe squamous cell carcinoma of lung status post radiation therapy on review of CT scan independently by me in comparison with the previous CT scan right upper lobe lung masses decrease in size. Right anterior middle lobe mass which is sub-solid as increase in size to 4 cm.  There is separate 5 mm right middle lobe pulmonary nodule stable from previous examination.  2.  Carcinoma of breast will get a mammogram done  MEDICAL DECISION MAKING:  Patient did have a PET scan done in December of 2015  Will repeat PET scan to see whether that sub-solid mass has increased uptake I will also discuss this case with pulmonologist to see if possibility of navigational bronchoscopy Case also would be discussed in tumor conference The patient will be reevaluated after PET scan to put a plan together   Patient expressed understanding and was in agreement with this plan. She also understands that She  can call clinic at any time with any questions, concerns, or complaints.    No matching staging information was found for the patient.  Forest Gleason, MD   08/11/2015 3:50 PM

## 2015-08-12 ENCOUNTER — Encounter: Payer: Self-pay | Admitting: Oncology

## 2015-08-15 ENCOUNTER — Ambulatory Visit
Admission: RE | Admit: 2015-08-15 | Discharge: 2015-08-15 | Disposition: A | Discharge status: Home / Self Care | Payer: BLUE CROSS/BLUE SHIELD | Source: Ambulatory Visit | Attending: Oncology | Admitting: Oncology

## 2015-08-15 DIAGNOSIS — I2510 Atherosclerotic heart disease of native coronary artery without angina pectoris: Secondary | ICD-10-CM | POA: Insufficient documentation

## 2015-08-15 DIAGNOSIS — C3490 Malignant neoplasm of unspecified part of unspecified bronchus or lung: Secondary | ICD-10-CM

## 2015-08-15 DIAGNOSIS — Z08 Encounter for follow-up examination after completed treatment for malignant neoplasm: Secondary | ICD-10-CM | POA: Insufficient documentation

## 2015-08-15 DIAGNOSIS — Z85118 Personal history of other malignant neoplasm of bronchus and lung: Secondary | ICD-10-CM | POA: Insufficient documentation

## 2015-08-15 DIAGNOSIS — R918 Other nonspecific abnormal finding of lung field: Secondary | ICD-10-CM

## 2015-08-15 LAB — GLUCOSE, CAPILLARY: Glucose-Capillary: 78 mg/dL (ref 65–99)

## 2015-08-15 MED ORDER — FLUDEOXYGLUCOSE F - 18 (FDG) INJECTION
12.6800 | Freq: Once | INTRAVENOUS | Status: DC | PRN
  Administered 2015-08-15: 12.68 via INTRAVENOUS
  Filled 2015-08-15: qty 12.68

## 2015-08-17 ENCOUNTER — Encounter: Payer: Self-pay | Admitting: Oncology

## 2015-08-17 ENCOUNTER — Inpatient Hospital Stay: Payer: BLUE CROSS/BLUE SHIELD | Attending: Oncology | Admitting: Oncology

## 2015-08-17 VITALS — BP 194/99 | HR 87 | Temp 95.3°F | Wt 142.4 lb

## 2015-08-17 DIAGNOSIS — Z79899 Other long term (current) drug therapy: Secondary | ICD-10-CM

## 2015-08-17 DIAGNOSIS — I2510 Atherosclerotic heart disease of native coronary artery without angina pectoris: Secondary | ICD-10-CM | POA: Diagnosis not present

## 2015-08-17 DIAGNOSIS — Z853 Personal history of malignant neoplasm of breast: Secondary | ICD-10-CM | POA: Insufficient documentation

## 2015-08-17 DIAGNOSIS — Z9221 Personal history of antineoplastic chemotherapy: Secondary | ICD-10-CM | POA: Diagnosis not present

## 2015-08-17 DIAGNOSIS — Z87891 Personal history of nicotine dependence: Secondary | ICD-10-CM | POA: Insufficient documentation

## 2015-08-17 DIAGNOSIS — Z923 Personal history of irradiation: Secondary | ICD-10-CM | POA: Diagnosis not present

## 2015-08-17 DIAGNOSIS — R42 Dizziness and giddiness: Secondary | ICD-10-CM | POA: Insufficient documentation

## 2015-08-17 DIAGNOSIS — R232 Flushing: Secondary | ICD-10-CM | POA: Insufficient documentation

## 2015-08-17 DIAGNOSIS — C3411 Malignant neoplasm of upper lobe, right bronchus or lung: Secondary | ICD-10-CM | POA: Diagnosis not present

## 2015-08-17 DIAGNOSIS — M129 Arthropathy, unspecified: Secondary | ICD-10-CM | POA: Insufficient documentation

## 2015-08-17 DIAGNOSIS — R61 Generalized hyperhidrosis: Secondary | ICD-10-CM | POA: Diagnosis not present

## 2015-08-17 DIAGNOSIS — R12 Heartburn: Secondary | ICD-10-CM

## 2015-08-17 DIAGNOSIS — R918 Other nonspecific abnormal finding of lung field: Secondary | ICD-10-CM

## 2015-08-17 DIAGNOSIS — R252 Cramp and spasm: Secondary | ICD-10-CM | POA: Diagnosis not present

## 2015-08-17 NOTE — Progress Notes (Signed)
Patient states she is having episodes of feeling like she is going to pass out.  Usually happens in the morning after she gets to work.  Patient also complains of having heartburn.  Further complains of hot flashes and night sweats to the point she has to get up, bathe and change clothes.  Also having cramps in her fingers and calves of legs.  Not sleeping well and has no appetite.

## 2015-08-17 NOTE — Progress Notes (Signed)
[place] @ [facility] Telephone:([phone]  Fax:(336) [place] OB: 1956-04-30  MR#: [id number]  PPJ#:[id number]  Patient Care Team: Marguerita Merles, MD as PCP - General (Family Medicine)  CHIEF COMPLAINT:  Chief Complaint  Patient presents with  . Results   Subjective: Chief Complaint/Diagnosis:   Chief Complaint/Problem List  1.  Carcinoma of breast, T3N1M0 tumor. ER/PR positive.   2.  Status post Tamoxifen.   3.  Status post modified radical mastectomy in November of 2000.    4.  Status post chemotherapy.   5.  Status post radiation therapy.  6.  On Aromasin starting February of 2005. 7.  Stopped Aromasin in May of 2010 8.abnormal screening CT scan with right upper lobe spiculated nodule and right middle lobe nodule found in November of 2015 PET scan shows hypermetabolic activity in right upper lobe nodule.  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  No cough no chest pain.  Shortness of breath on exertion.  Patient had a previous history of carcinoma of breast and recently had a right upper lobe lung cancer which has been radiated.  Patient had a repeat CT scan.  No hemoptysis.  No weight loss.  Patient has quit smoking 3 years ago. August 17, 2015 Patient states she is having episodes of feeling like she is going to pass out. Usually happens in the morning after she gets to work. Patient also complains of having heartburn. Further complains of hot flashes and night sweats to the point she has to get up, bathe and change clothes. Also having cramps in her fingers and calves of legs. Not sleeping well and has no appetite Patient is here for further evaluation and to discuss the results of the PET scan  REVIEW OF SYSTEMS:   GENERAL:  Feels good.  Active.  No fevers, sweats or weight  loss. PERFORMANCE STATUS (ECOG): 01 HEENT:  No visual changes, runny nose, sore throat, mouth sores or tenderness. Lungs: No shortness of breath or cough.  No hemoptysis. Cardiac:  No chest pain, palpitations, orthopnea, or PND. GI:  No nausea, vomiting, diarrhea, constipation, melena or hematochezia. GU:  No urgency, frequency, dysuria, or hematuria. Musculoskeletal:  No back pain.  No joint pain.  No muscle tenderness. Extremities:  No pain or swelling. Skin:  No rashes or skin changes. Neuro:  No headache, numbness or weakness, balance or coordination issues. Endocrine:  No diabetes, thyroid issues, hot flashes or night sweats. Psych:  No mood changes, depression or anxiety. Pain:  No focal pain. Review of systems:  All other systems reviewed and found to be negative. As per HPI. Otherwise, a complete review of systems is negatve.  PAST MEDICAL HISTORY: Past Medical History  Diagnosis Date  . Cancer ([place]) left     breast cancer 2000  . Status post chemotherapy 2001    left breast cancer  . Status post radiation therapy 2001    left breast cancer  . Arthritis   . Lung nodule     PAST SURGICAL HISTORY: Past Surgical History  Procedure Laterality Date  . Mastectomy Left     2000  . Lung biopsy    . Breast biospy    . Rotator cuff repair      FAMILY HISTORY Family History  Problem Relation Age of Onset  . Breast cancer Mother 54  . Breast cancer Paternal Aunt 42    ADVANCED DIRECTIVES:  No flowsheet data found.  HEALTH MAINTENANCE: Social History  Substance Use Topics  . Smoking status: Former Research scientist (life sciences)  . Smokeless tobacco: Never Used  . Alcohol Use: No      No Known Allergies  Current Outpatient Prescriptions  Medication Sig Dispense Refill  . albuterol (PROAIR HFA) 108 (90 BASE) MCG/ACT inhaler Inhale into the lungs.    . celecoxib (CELEBREX) 200 MG capsule Take 1 capsule (200 mg total) by mouth daily. 30 capsule 0  . citalopram (CELEXA) 20 MG tablet  Take by mouth.    . cyclobenzaprine (FLEXERIL) 10 MG tablet Take by mouth.    . losartan-hydrochlorothiazide (HYZAAR) 100-25 MG per tablet Take 1 tablet by mouth daily. 30 tablet 0   No current facility-administered medications for this visit.   Facility-Administered Medications Ordered in Other Visits  Medication Dose Route Frequency Provider Last Rate Last Dose  . fludeoxyglucose F - 18 (FDG) injection 12.68 milli Curie  12.68 milli Curie Intravenous Once PRN Forest Gleason, MD   12.68 milli Curie at 08/15/15 1240  Significant History/PMH:   Depression:    lymphedema:    htn:    breast cancer:    Shoulder Surgery - Right:    Mastectomy:   Preventive Screening:  Has patient had any of the following test? Colonscopy  Mammography (1)   Last Colonoscopy: > 5 years(1)   Last Mammography: 01/2014(1)    OBJECTIVE:  Filed Vitals:   08/17/15 0828  BP: 194/99  Pulse: 87  Temp: 95.3 F (35.2 C)     Body mass index is 23 kg/(m^2).    ECOG FS:1 - Symptomatic but completely ambulatory  PHYSICAL EXAM: General  status: Performance status is good.  Patient has not lost significant weight. Since last evaluation there is no significant change in the general status HEENT: No evidence of stomatitis. Sclera and conjunctivae :: No jaundice.   pale looking. Lungs: Emphysematous chest.  Diminished air entry on both sides. Lymphatic system: Cervical, axillary, inguinal, lymph nodes not palpable GI: Abdomen is soft.liver and spleen not palpable.  No ascites.  Bowel sounds are normal.  No other palpable masses.  No tenderness . Lower extremity: No edema Neurological system: Higher functions, cranial nerves intact no evidence of peripheral neuropathy. Skin: No rash.  No ecchymosis.. No petechial hemorrhages  Examination of right breast: Chest wall area no evidence of recurrent disease ,the left breast free of masses LAB RESULTS:  CBC Latest Ref Rng 08/11/2015 02/09/2015  WBC 3.6 - 11.0 K/uL  5.0 4.9  Hemoglobin 12.0 - 16.0 g/dL 11.2(L) 11.5(L)  Hematocrit 35.0 - 47.0 % 34.0(L) 34.9(L)  Platelets 150 - 440 K/uL 230 249    Hospital Outpatient Visit on 08/15/2015  Component Date Value Ref Range Status  . Glucose-Capillary 08/15/2015 78  65 - 99 mg/dL Final       STUDIES: Ct Chest Wo Contrast  08/09/2015  CLINICAL DATA:  Left breast cancer status post mastectomy in 2003. Squamous cell lung carcinoma of the right upper lobe diagnosed in January 2016 presenting for restaging. EXAM: CT CHEST WITHOUT CONTRAST TECHNIQUE: Multidetector CT imaging of the chest was performed following the standard protocol without IV contrast. COMPARISON:  02/07/2015 chest CT. FINDINGS: Mediastinum/Nodes: Normal heart size. No pericardial fluid/thickening. There is atherosclerosis of the thoracic aorta, the great vessels of the mediastinum and the coronary arteries, including calcified  atherosclerotic plaque in the left main, left anterior descending, left circumflex and right coronary arteries. Great vessels are normal in course and caliber. Normal visualized thyroid. Normal esophagus. No pathologically enlarged axillary, mediastinal or gross lymph nodes, noting limited sensitivity for the detection of hilar adenopathy on this noncontrast study. Lungs/Pleura: No pneumothorax. No pleural effusion. Mild-to-moderate centrilobular and paraseptal emphysema with diffuse bronchial wall thickening. There is a 0.7 x 0.7 cm right upper lobe spiculated pulmonary nodule (series 4/ image 21), decreased from 0.9 x 0.6 cm. There is new sharply marginated patchy consolidation and ground-glass opacity in the right upper lobe surrounding the spiculated pulmonary nodule, in keeping with postradiation change. There is a sub-solid lung mass measuring 4.0 x 3.6 cm in total with solid components measuring up to 1.3 cm (series 4/image 29) in the anterior medial right lung spanning the minor fissure with involvement of the basilar right  upper lobe and medial right middle lobe, which measured 3.9 x 3.4 cm on 02/07/2015 and 3.1 x 2.6 cm on 09/06/2014 using similar measurement technique, in keeping with continued progression. A separate 5 mm solid right middle lobe pulmonary nodule (4/28) is stable since 09/06/2014. Stable 1.0 cm apical left upper lobe solid pulmonary nodule (4/6), which was non FDG avid on 09/24/2014. Stable minimal radiation fibrosis in the anterior left upper lobe. No new significant pulmonary nodules. No acute consolidative airspace disease. Upper abdomen: Stable 0.7 cm left liver lobe hypodense lesion, too small to characterize. Stable simple 1.3 cm renal cyst in the medial upper left kidney. Musculoskeletal: No aggressive appearing focal osseous lesions. Status post left mastectomy. IMPRESSION: 1. Partial interval treatment response of the spiculated subcentimeter right upper lobe pulmonary nodule (corresponding with the biopsy-proven squamous cell lung carcinoma), with evolving surrounding postradiation change. 2. Continued progression of a subsolid 4.0 cm lung mass in the anterior medial right lung spanning the minor fissure, most in keeping with a separate primary lung adenocarcinoma. 3. Separate 5 mm right middle lobe pulmonary nodule, stable for 11 months. 4. Mild-to-moderate centrilobular and paraseptal emphysema with diffuse bronchial wall thickening, suggesting COPD. 5. Atherosclerosis, including left main and 3 vessel coronary artery disease. Please note that although the presence of coronary artery calcium documents the presence of coronary artery disease, the severity of this disease and any potential stenosis cannot be assessed on this non-gated CT examination. Electronically Signed   By: Ilona Sorrel M.D.   On: 08/09/2015 10:56   Nm Pet Image Restag (ps) Skull Base To Thigh  08/15/2015  CLINICAL DATA:  Subsequent treatment strategy for malignant neoplasm of lung, unspecified laterality. EXAM: NUCLEAR MEDICINE  PET SKULL BASE TO THIGH TECHNIQUE: 12.7 mCi F-18 FDG was injected intravenously. Full-ring PET imaging was performed from the skull base to thigh after the radiotracer. CT data was obtained and used for attenuation correction and anatomic localization. FASTING BLOOD GLUCOSE:  Value: 78 mg/dl COMPARISON:  CT chest 08/09/2015, 02/07/2015 and PET 09/24/2014. FINDINGS: NECK No hypermetabolic lymph nodes in the neck. CT images show no acute findings. CHEST No hypermetabolic mediastinal, hilar or axillary lymph nodes. Scarring in the apex of the left upper lobe shows no abnormal hypermetabolism. Subpleural ground-glass in the right upper lobe does not show abnormal hypermetabolism and is indicative of radiation therapy. A branching micro nodular lesion in the medial aspect of the right middle lobe is grossly unchanged (measurement is difficult given its somewhat amorphous appearance) and does not show metabolism above blood pool. CT images show no pericardial or pleural effusion.  Three-vessel coronary artery calcification. ABDOMEN/PELVIS No abnormal hypermetabolism in the liver, adrenal glands, spleen or pancreas. No hypermetabolic lymph nodes. CT images show a 7 mm low-attenuation lesion in the dome of the liver, too small to characterize. There may be sludge in the gallbladder. Adrenal glands, kidneys, spleen, pancreas, stomach and bowel are grossly unremarkable. Bladder wall appears thickened but the bladder is under distended. SKELETON No abnormal osseous hypermetabolism. IMPRESSION: 1. Posttreatment changes in the right upper lobe without evidence of residual disease. 2. Branching micronodular lesion in the medial aspect of the right middle lobe does not show abnormal hypermetabolism. Continued attention on followup exams is warranted as low-grade adenocarcinoma cannot be excluded. 3. Three-vessel coronary artery calcification. 4. Question gallbladder sludge. Electronically Signed   By: Lorin Picket M.D.   On:  08/15/2015 14:46    ASSESSMENT: Right upper lobe squamous cell carcinoma of lung status post radiation therapy on review of CT scan independently by me in comparison with the previous CT scan right upper lobe lung masses decrease in size. Right anterior middle lobe mass which is sub-solid as increase in size to 4 cm.  There is separate 5 mm right middle lobe pulmonary nodule stable from previous examination.  2.  Carcinoma of breast will get a mammogram done  MEDICAL DECISION MAKING:  At scan has been reviewed.  Right upper lobe nodule did not show any increase uptake.   We will continue to follow this right upper lobe nodule case will be discussed tomorrow in tumor conference.  Scan has been independently reviewed by me as well as reviewed with the patient. 2.  Some of the complains that patient has is secondary to postmenopausal symptoms.  Patient was advised to increase Celexa to 40 mg for 15 days and see whether some of the symptoms get better. After discussing case in tumor conference will decide whether further bronchoscopy or biopsy evaluation is needed.  If not then repeat CT scan without contrast would be done in 6 months for follow-up.    Patient expressed understanding and was in agreement with this plan. She also understands that She can call clinic at any time with any questions, concerns, or complaints.    No matching staging information was found for the patient.  Forest Gleason, MD   08/17/2015 8:51 AM

## 2015-09-21 ENCOUNTER — Telehealth: Payer: Self-pay | Admitting: *Deleted

## 2015-09-21 NOTE — Telephone Encounter (Signed)
Patient called with request for [place] to mail to her the following information:  Name of MD, Address, Phone and Fax number, list of prescriptions, last appointment, next appointment and diagnosis.  Mailed this information to patient 09-21-15.  Also requesting refill for all her medications, except the inhalers.

## 2015-09-22 ENCOUNTER — Telehealth: Payer: Self-pay | Admitting: *Deleted

## 2015-09-22 NOTE — Telephone Encounter (Signed)
Pt will need to contact PCP for refills.

## 2015-09-22 NOTE — Telephone Encounter (Signed)
Called patient and left message that she will need to contact her PCP to have her medications refilled.

## 2015-09-28 ENCOUNTER — Telehealth: Payer: Self-pay | Admitting: *Deleted

## 2015-09-28 MED ORDER — LOSARTAN POTASSIUM-HCTZ 100-25 MG PO TABS: 1.0000 | ORAL_TABLET | Freq: Every day | ORAL | Status: DC

## 2015-09-28 NOTE — Telephone Encounter (Signed)
Escribed

## 2015-10-25 NOTE — Telephone Encounter (Signed)
Entered in error

## 2015-11-04 ENCOUNTER — Telehealth: Payer: Self-pay | Admitting: *Deleted

## 2015-11-04 NOTE — Telephone Encounter (Signed)
Burning sensation in right shoulder started on Sunday, now in right hip chest leg and has gotten worse. Denies rash or blistering. Appt given to see Dr Oliva Bustard on Tuesday 1/24 and agreed to by pt

## 2015-11-04 NOTE — Telephone Encounter (Signed)
Asks for a return call to discuss moving appt up for issues she has going on.

## 2015-11-08 ENCOUNTER — Ambulatory Visit
Admission: RE | Admit: 2015-11-08 | Discharge: 2015-11-08 | Disposition: A | Discharge status: Home / Self Care | Payer: BLUE CROSS/BLUE SHIELD | Source: Ambulatory Visit | Attending: Oncology | Admitting: Oncology

## 2015-11-08 ENCOUNTER — Inpatient Hospital Stay: Payer: BLUE CROSS/BLUE SHIELD | Attending: Oncology | Admitting: Oncology

## 2015-11-08 ENCOUNTER — Inpatient Hospital Stay: Payer: BLUE CROSS/BLUE SHIELD

## 2015-11-08 ENCOUNTER — Encounter: Payer: Self-pay | Admitting: Oncology

## 2015-11-08 VITALS — BP 184/108 | HR 87 | Temp 97.3°F | Resp 18 | Wt 148.6 lb

## 2015-11-08 DIAGNOSIS — Z9012 Acquired absence of left breast and nipple: Secondary | ICD-10-CM | POA: Diagnosis not present

## 2015-11-08 DIAGNOSIS — Z79899 Other long term (current) drug therapy: Secondary | ICD-10-CM | POA: Insufficient documentation

## 2015-11-08 DIAGNOSIS — Z923 Personal history of irradiation: Secondary | ICD-10-CM | POA: Insufficient documentation

## 2015-11-08 DIAGNOSIS — M25511 Pain in right shoulder: Secondary | ICD-10-CM | POA: Diagnosis not present

## 2015-11-08 DIAGNOSIS — M25521 Pain in right elbow: Secondary | ICD-10-CM | POA: Diagnosis not present

## 2015-11-08 DIAGNOSIS — R0602 Shortness of breath: Secondary | ICD-10-CM | POA: Diagnosis not present

## 2015-11-08 DIAGNOSIS — M129 Arthropathy, unspecified: Secondary | ICD-10-CM | POA: Diagnosis not present

## 2015-11-08 DIAGNOSIS — C3490 Malignant neoplasm of unspecified part of unspecified bronchus or lung: Secondary | ICD-10-CM

## 2015-11-08 DIAGNOSIS — Z9221 Personal history of antineoplastic chemotherapy: Secondary | ICD-10-CM | POA: Insufficient documentation

## 2015-11-08 DIAGNOSIS — Z87891 Personal history of nicotine dependence: Secondary | ICD-10-CM | POA: Insufficient documentation

## 2015-11-08 DIAGNOSIS — Z85118 Personal history of other malignant neoplasm of bronchus and lung: Secondary | ICD-10-CM | POA: Diagnosis not present

## 2015-11-08 DIAGNOSIS — Z803 Family history of malignant neoplasm of breast: Secondary | ICD-10-CM

## 2015-11-08 DIAGNOSIS — Z853 Personal history of malignant neoplasm of breast: Secondary | ICD-10-CM

## 2015-11-08 DIAGNOSIS — J439 Emphysema, unspecified: Secondary | ICD-10-CM | POA: Insufficient documentation

## 2015-11-08 DIAGNOSIS — E119 Type 2 diabetes mellitus without complications: Secondary | ICD-10-CM | POA: Diagnosis not present

## 2015-11-08 LAB — COMPREHENSIVE METABOLIC PANEL
ALT: 20 U/L (ref 14–54)
AST: 29 U/L (ref 15–41)
Albumin: 4.3 g/dL (ref 3.5–5.0)
Alkaline Phosphatase: 72 U/L (ref 38–126)
Anion gap: 9 (ref 5–15)
BUN: 14 mg/dL (ref 6–20)
CALCIUM: 9.5 mg/dL (ref 8.9–10.3)
CHLORIDE: 99 mmol/L — AB (ref 101–111)
CO2: 27 mmol/L (ref 22–32)
Creatinine, Ser: 0.81 mg/dL (ref 0.44–1.00)
Glucose, Bld: 107 mg/dL — ABNORMAL HIGH (ref 65–99)
POTASSIUM: 3.2 mmol/L — AB (ref 3.5–5.1)
Sodium: 135 mmol/L (ref 135–145)
TOTAL PROTEIN: 8 g/dL (ref 6.5–8.1)
Total Bilirubin: 0.9 mg/dL (ref 0.3–1.2)

## 2015-11-08 LAB — CBC WITH DIFFERENTIAL/PLATELET
BASOS ABS: 0.1 10*3/uL (ref 0–0.1)
Basophils Relative: 2 %
EOS PCT: 1 %
Eosinophils Absolute: 0.1 10*3/uL (ref 0–0.7)
HCT: 37 % (ref 35.0–47.0)
Hemoglobin: 12.5 g/dL (ref 12.0–16.0)
LYMPHS ABS: 1.6 10*3/uL (ref 1.0–3.6)
LYMPHS PCT: 36 %
MCH: 31.6 pg (ref 26.0–34.0)
MCHC: 33.8 g/dL (ref 32.0–36.0)
MCV: 93.5 fL (ref 80.0–100.0)
Monocytes Absolute: 0.3 10*3/uL (ref 0.2–0.9)
Monocytes Relative: 8 %
Neutro Abs: 2.4 10*3/uL (ref 1.4–6.5)
Neutrophils Relative %: 53 %
PLATELETS: 265 10*3/uL (ref 150–440)
RBC: 3.96 MIL/uL (ref 3.80–5.20)
RDW: 13.5 % (ref 11.5–14.5)
WBC: 4.5 10*3/uL (ref 3.6–11.0)

## 2015-11-08 NOTE — Progress Notes (Signed)
Patient states for past week she has had joint pain in right hip, knee and shoulder and left thumb.  Patient also c/o headache for past one week.  BP 184/108.  Rechecked with smaller cuff 181/126  HR 88.  Patient c/o sob and nausea also,

## 2015-11-08 NOTE — Progress Notes (Signed)
[place] @ [facility] Telephone:(336) [phone]  Fax:(336) [place] OB: [birth year]  MR#: [id number]  LNL#:[id number]  Patient Care Team: Marguerita Merles, MD as PCP - General (Family Medicine)  CHIEF COMPLAINT:  Chief Complaint  Patient presents with  . Lung Cancer   Subjective: Chief Complaint/Diagnosis:   Chief Complaint/Problem List  1.  Carcinoma of breast, T3N1M0 tumor. ER/PR positive.   2.  Status post Tamoxifen.   3.  Status post modified radical mastectomy in November of 2000.    4.  Status post chemotherapy.   5.  Status post radiation therapy.  6.  On Aromasin starting February of 2005. 7.  Stopped Aromasin in May of 2010 8.abnormal screening CT scan with right upper lobe spiculated nodule and right middle lobe nodule found in November of 2015 PET scan shows hypermetabolic activity in right upper lobe nodule.  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  Patient has stopped smoking.  Had history of breast cancer and lung cancer in the past.  Has a right shoulder pain and right elbow pain. Swelling of the left upper extremity.[name] Here for further follow-up and treatment consideration  REVIEW OF SYSTEMS:   GENERAL: She is somewhat anxious and apprehensive. PERFORMANCE STATUS (ECOG): 01 HEENT:  No visual changes, runny nose, sore throat, mouth sores or tenderness. Lungs: Shortness of breath particularly on exertion.  Patient has stopped smoking. Cardiac:  No chest pain, palpitations, orthopnea, or PND. GI:  No nausea, vomiting, diarrhea, constipation, melena or hematochezia. GU:  No urgency, frequency, dysuria, or hematuria. Musculoskeletal:  No back pain.  No joint pain.  No muscle tenderness. Extremities:  No pain or swelling. Skin:  No rashes or skin  changes. Neuro:  No headache, numbness or weakness, balance or coordination issues. Endocrine:  No diabetes, thyroid issues, hot flashes or night sweats. Psych:  No mood changes, depression or anxiety. Pain:  No focal pain. Review of systems:  All other systems reviewed and found to be negative. As per HPI. Otherwise, a complete review of systems is negatve.  PAST MEDICAL HISTORY: Past Medical History  Diagnosis Date  . Cancer ([place]) left     breast cancer 2000  . Status post chemotherapy 2001    left breast cancer  . Status post radiation therapy 2001    left breast cancer  . Arthritis   . Lung nodule     PAST SURGICAL HISTORY: Past Surgical History  Procedure Laterality Date  . Mastectomy Left     2000  . Lung biopsy    . Breast biospy    . Rotator cuff repair      FAMILY HISTORY Family History  Problem Relation Age of Onset  . Breast cancer Mother 65  . Breast cancer Paternal Aunt 37    ADVANCED DIRECTIVES:  No flowsheet data found.  HEALTH MAINTENANCE: Social History  Substance Use Topics  . Smoking status: Former Research scientist (life sciences)  . Smokeless tobacco: Never Used  . Alcohol Use: No      No Known Allergies  Current Outpatient Prescriptions  Medication Sig Dispense Refill  . albuterol (PROAIR HFA) 108 (90 BASE) MCG/ACT inhaler Inhale into the lungs.    . celecoxib (CELEBREX) 200 MG capsule Take 1 capsule (200 mg total) by  mouth daily. 30 capsule 0  . citalopram (CELEXA) 20 MG tablet Take by mouth.    . cyclobenzaprine (FLEXERIL) 10 MG tablet Take by mouth.    . losartan-hydrochlorothiazide (HYZAAR) 100-25 MG tablet Take 1 tablet by mouth daily. 30 tablet 0   No current facility-administered medications for this visit.  Significant History/PMH:   Depression:    lymphedema:    htn:    breast cancer:    Shoulder Surgery - Right:    Mastectomy:   Preventive Screening:  Has patient had any of the following test? Colonscopy  Mammography (1)   Last  Colonoscopy: > 5 years(1)   Last Mammography: 01/2014(1)    OBJECTIVE:  Filed Vitals:   11/08/15 0833  BP: 184/108  Pulse: 87  Temp: 97.3 F (36.3 C)  Resp: 18     Body mass index is 23.99 kg/(m^2).    ECOG FS:1 - Symptomatic but completely ambulatory  PHYSICAL EXAM: General  status: Performance status is good.  Patient has not lost significant weight. Since last evaluation there is no significant change in the general status HEENT: No evidence of stomatitis. Sclera and conjunctivae :: No jaundice.   pale looking. Lungs: Emphysematous chest.  Diminished air entry on both sides. Lymphatic system: Cervical, axillary, inguinal, lymph nodes not palpable GI: Abdomen is soft.liver and spleen not palpable.  No ascites.  Bowel sounds are normal.  No other palpable masses.  No tenderness . Lower extremity: No edema.  Left upper extremity swelling. Examination of breasts: Left chest wall area no evidence of recurrent disease right breast free of masses Neurological system: Higher functions, cranial nerves intact no evidence of peripheral neuropathy. Skin: No rash.  No ecchymosis.. No petechial hemorrhages  Examination of right breast: Chest wall area no evidence of recurrent disease ,the left breast free of masses LAB RESULTS:  CBC Latest Ref Rng 08/11/2015 02/09/2015  WBC 3.6 - 11.0 K/uL 5.0 4.9  Hemoglobin 12.0 - 16.0 g/dL 11.2(L) 11.5(L)  Hematocrit 35.0 - 47.0 % 34.0(L) 34.9(L)  Platelets 150 - 440 K/uL 230 249    No visits with results within 5 Day(s) from this visit. Latest known visit with results is:  Hospital Outpatient Visit on 08/15/2015  Component Date Value Ref Range Status  . Glucose-Capillary 08/15/2015 78  65 - 99 mg/dL Final       S ASSESSMENT:  MEDICAL DECISION MAKING:  1.  Shortness of breath on exertion.  We will get a chest x-ray.  Evaluate chest x-ray decide any need for a CT scan 2.  Hypertension.  His son is on antihypertensive medication patient was  advised right away to go to [place] clinic to primary care physician for evaluation 3.  Recheck CBC and comprehensive metabolic panel 4.  Carcinoma breast will get another mammogram done there is no clinical evidence of recurrent disease 5.  Left upper extremity lymphedema Review chest x-ray lab to decide that need for CT scan. Lab data is pending will be reviewed today. Total duration of visit was 45 minutes.  50% or more time was spent in counseling patient regarding need for follow-up with primary care physician regarding hypertension to adjust medication.  Patient expressed understanding and was in agreement with this plan. She also understands that She can call clinic at any time with any questions, concerns, or complaints.    No matching staging information was found for the patient.  Forest Gleason, MD   11/08/2015 8:44 AM

## 2015-11-09 ENCOUNTER — Telehealth: Payer: Self-pay | Admitting: *Deleted

## 2015-11-09 ENCOUNTER — Other Ambulatory Visit: Payer: Self-pay | Admitting: *Deleted

## 2015-11-09 ENCOUNTER — Other Ambulatory Visit: Payer: Self-pay | Admitting: Family Medicine

## 2015-11-09 DIAGNOSIS — R0602 Shortness of breath: Secondary | ICD-10-CM

## 2015-11-09 DIAGNOSIS — C3490 Malignant neoplasm of unspecified part of unspecified bronchus or lung: Secondary | ICD-10-CM

## 2015-11-09 LAB — CANCER ANTIGEN 27.29: CA 27.29: 14.8 U/mL (ref 0.0–38.6)

## 2015-11-09 NOTE — Telephone Encounter (Signed)
Called patient to let her know that her CXR was negative, however, MD will be ordering CT to rule out blood clot.  Our scheduler will call her with that appointment.  Verbalized understanding.

## 2015-11-09 NOTE — Telephone Encounter (Signed)
Chest xray negative. MD recommends proceeding with CT scan of chest to evaluate for PE. Scheduling will call pt to notify pt with appt details.

## 2015-11-10 ENCOUNTER — Ambulatory Visit
Admission: RE | Admit: 2015-11-10 | Discharge: 2015-11-10 | Disposition: A | Discharge status: Home / Self Care | Payer: BLUE CROSS/BLUE SHIELD | Source: Ambulatory Visit | Attending: Oncology | Admitting: Oncology

## 2015-11-10 DIAGNOSIS — I2510 Atherosclerotic heart disease of native coronary artery without angina pectoris: Secondary | ICD-10-CM | POA: Insufficient documentation

## 2015-11-10 DIAGNOSIS — C3490 Malignant neoplasm of unspecified part of unspecified bronchus or lung: Secondary | ICD-10-CM | POA: Insufficient documentation

## 2015-11-10 DIAGNOSIS — Z853 Personal history of malignant neoplasm of breast: Secondary | ICD-10-CM | POA: Diagnosis not present

## 2015-11-10 DIAGNOSIS — R918 Other nonspecific abnormal finding of lung field: Secondary | ICD-10-CM | POA: Diagnosis not present

## 2015-11-10 DIAGNOSIS — R0602 Shortness of breath: Secondary | ICD-10-CM | POA: Insufficient documentation

## 2015-11-10 DIAGNOSIS — J439 Emphysema, unspecified: Secondary | ICD-10-CM | POA: Insufficient documentation

## 2015-11-10 HISTORY — DX: Type 2 diabetes mellitus without complications: E11.9

## 2015-11-10 MED ORDER — IOHEXOL 350 MG/ML SOLN
75.0000 mL | Freq: Once | INTRAVENOUS | Status: AC | PRN
  Administered 2015-11-10: 75 mL via INTRAVENOUS

## 2015-11-14 ENCOUNTER — Inpatient Hospital Stay (HOSPITAL_BASED_OUTPATIENT_CLINIC_OR_DEPARTMENT_OTHER): Payer: BLUE CROSS/BLUE SHIELD | Admitting: Oncology

## 2015-11-14 ENCOUNTER — Encounter: Payer: Self-pay | Admitting: Oncology

## 2015-11-14 VITALS — BP 177/95 | HR 90 | Temp 97.3°F | Resp 18 | Wt 147.7 lb

## 2015-11-14 DIAGNOSIS — Z85118 Personal history of other malignant neoplasm of bronchus and lung: Secondary | ICD-10-CM | POA: Diagnosis not present

## 2015-11-14 DIAGNOSIS — Z853 Personal history of malignant neoplasm of breast: Secondary | ICD-10-CM | POA: Diagnosis not present

## 2015-11-14 DIAGNOSIS — J439 Emphysema, unspecified: Secondary | ICD-10-CM

## 2015-11-14 DIAGNOSIS — Z803 Family history of malignant neoplasm of breast: Secondary | ICD-10-CM

## 2015-11-14 DIAGNOSIS — M25511 Pain in right shoulder: Secondary | ICD-10-CM

## 2015-11-14 DIAGNOSIS — E119 Type 2 diabetes mellitus without complications: Secondary | ICD-10-CM

## 2015-11-14 DIAGNOSIS — M129 Arthropathy, unspecified: Secondary | ICD-10-CM

## 2015-11-14 DIAGNOSIS — Z87891 Personal history of nicotine dependence: Secondary | ICD-10-CM

## 2015-11-14 DIAGNOSIS — Z9221 Personal history of antineoplastic chemotherapy: Secondary | ICD-10-CM

## 2015-11-14 DIAGNOSIS — Z923 Personal history of irradiation: Secondary | ICD-10-CM

## 2015-11-14 DIAGNOSIS — R0602 Shortness of breath: Secondary | ICD-10-CM

## 2015-11-14 DIAGNOSIS — M25521 Pain in right elbow: Secondary | ICD-10-CM

## 2015-11-14 DIAGNOSIS — Z9012 Acquired absence of left breast and nipple: Secondary | ICD-10-CM

## 2015-11-14 DIAGNOSIS — Z79899 Other long term (current) drug therapy: Secondary | ICD-10-CM | POA: Diagnosis not present

## 2015-11-14 DIAGNOSIS — C3410 Malignant neoplasm of upper lobe, unspecified bronchus or lung: Secondary | ICD-10-CM

## 2015-11-14 DIAGNOSIS — J438 Other emphysema: Secondary | ICD-10-CM

## 2015-11-14 MED ORDER — POTASSIUM CHLORIDE CRYS ER 20 MEQ PO TBCR: 20.0000 meq | EXTENDED_RELEASE_TABLET | Freq: Every day | ORAL | Status: DC

## 2015-11-14 NOTE — Progress Notes (Signed)
Patient here today for CT results.  Patient continues to have right hip pain.

## 2015-11-14 NOTE — Progress Notes (Signed)
[place] @ [facility] [phone]  Fax:([phone]) [place] OB: 09-03-1956  MR#: [id number]  STM#:[id number]  Patient Care Team: Marguerita Merles, MD as PCP - General (Family Medicine)  CHIEF COMPLAINT:  Chief Complaint  Patient presents with  . Lung Cancer   Subjective: Chief Complaint/Diagnosis:   Chief Complaint/Problem List  1.  Carcinoma of breast, T3N1M0 tumor. ER/PR positive.   2.  Status post Tamoxifen.   3.  Status post modified radical mastectomy in November of 2000.    4.  Status post chemotherapy.   5.  Status post radiation therapy.  6.  On Aromasin starting February of 2005. 7.  Stopped Aromasin in May of 2010 8.abnormal screening CT scan with right upper lobe spiculated nodule and right middle lobe nodule found in November of 2015 PET scan shows hypermetabolic activity in right upper lobe nodule.  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  Patient has stopped smoking.  Had history of breast cancer and lung cancer in the past.  Has a right shoulder pain and right elbow pain. Swelling of the left upper extremity.Danielle [name] Here for further follow-up and treatment consideration, Patient continues to shortness of breath.  Patient has oxygen at home but is not using it.  Shortness of breath is during exertion.  REVIEW OF SYSTEMS:   GENERAL: She is somewhat anxious and apprehensive. PERFORMANCE STATUS (ECOG): 01 HEENT:  No visual changes, runny nose, sore throat, mouth sores or tenderness. Lungs: Shortness of breath particularly on exertion.  Patient has stopped smoking. Cardiac:  No chest pain, palpitations, orthopnea, or PND. GI:  No nausea, vomiting, diarrhea, constipation, melena or hematochezia. GU:  No urgency, frequency, dysuria, or  hematuria. Musculoskeletal:  No back pain.  No joint pain.  No muscle tenderness. Extremities:  No pain or swelling. Skin:  No rashes or skin changes. Neuro:  No headache, numbness or weakness, balance or coordination issues. Endocrine:  No diabetes, thyroid issues, hot flashes or night sweats. Psych:  No mood changes, depression or anxiety. Pain:  No focal pain. Review of systems:  All other systems reviewed and found to be negative. As per HPI. Otherwise, a complete review of systems is negatve.  PAST MEDICAL HISTORY: Past Medical History  Diagnosis Date  . Cancer ([place]) left     breast cancer 2000  . Status post chemotherapy 2001    left breast cancer  . Status post radiation therapy 2001    left breast cancer  . Arthritis   . Lung nodule   . Diabetes mellitus without complication ([place])     PAST SURGICAL HISTORY: Past Surgical History  Procedure Laterality Date  . Mastectomy Left     2000  . Lung biopsy    . Breast biospy    . Rotator cuff repair      FAMILY HISTORY Family History  Problem Relation Age of Onset  . Breast cancer Mother 68  . Breast cancer Paternal Aunt 59    ADVANCED DIRECTIVES:  No flowsheet data found.  HEALTH MAINTENANCE: Social History  Substance Use Topics  . Smoking status: Former Research scientist (life sciences)  . Smokeless tobacco: Never Used  . Alcohol Use: No      No Known Allergies  Current Outpatient Prescriptions  Medication Sig  Dispense Refill  . albuterol (PROAIR HFA) 108 (90 BASE) MCG/ACT inhaler Inhale into the lungs.    . celecoxib (CELEBREX) 200 MG capsule Take 1 capsule (200 mg total) by mouth daily. 30 capsule 0  . citalopram (CELEXA) 20 MG tablet Take by mouth.    . cyclobenzaprine (FLEXERIL) 10 MG tablet Take by mouth.    . losartan-hydrochlorothiazide (HYZAAR) 100-25 MG tablet Take 1 tablet by mouth daily. 30 tablet 0   No current facility-administered medications for this visit.  Significant History/PMH:   Depression:     lymphedema:    htn:    breast cancer:    Shoulder Surgery - Right:    Mastectomy:   Preventive Screening:  Has patient had any of the following test? Colonscopy  Mammography (1)   Last Colonoscopy: > 5 years(1)   Last Mammography: 01/2014(1)    OBJECTIVE:  Filed Vitals:   11/14/15 1421  BP: 177/95  Pulse: 90  Temp: 97.3 F (36.3 C)  Resp: 18     Body mass index is 23.85 kg/(m^2).    ECOG FS:1 - Symptomatic but completely ambulatory  PHYSICAL EXAM: General  status: Performance status is good.  Patient has not lost significant weight. Since last evaluation there is no significant change in the general status HEENT: No evidence of stomatitis. Sclera and conjunctivae :: No jaundice.   pale looking. Lungs: Emphysematous chest.  Diminished air entry on both sides. Lymphatic system: Cervical, axillary, inguinal, lymph nodes not palpable GI: Abdomen is soft.liver and spleen not palpable.  No ascites.  Bowel sounds are normal.  No other palpable masses.  No tenderness . Lower extremity: No edema.  Left upper extremity swelling. Examination of breasts: Left chest wall area no evidence of recurrent disease right breast free of masses Neurological system: Higher functions, cranial nerves intact no evidence of peripheral neuropathy. Skin: No rash.  No ecchymosis.. No petechial hemorrhages  Examination of right breast: Chest wall area no evidence of recurrent disease ,the left breast free of masses LAB RESULTS:  CBC Latest Ref Rng 11/08/2015 08/11/2015  WBC 3.6 - 11.0 K/uL 4.5 5.0  Hemoglobin 12.0 - 16.0 g/dL 12.5 11.2(L)  Hematocrit 35.0 - 47.0 % 37.0 34.0(L)  Platelets 150 - 440 K/uL 265 230    No visits with results within 5 Day(s) from this visit. Latest known visit with results is:  Appointment on 11/08/2015  Component Date Value Ref Range Status  . WBC 11/08/2015 4.5  3.6 - 11.0 K/uL Final  . RBC 11/08/2015 3.96  3.80 - 5.20 MIL/uL Final  . Hemoglobin 11/08/2015 12.5   12.0 - 16.0 g/dL Final  . HCT 11/08/2015 37.0  35.0 - 47.0 % Final  . MCV 11/08/2015 93.5  80.0 - 100.0 fL Final  . MCH 11/08/2015 31.6  26.0 - 34.0 pg Final  . MCHC 11/08/2015 33.8  32.0 - 36.0 g/dL Final  . RDW 11/08/2015 13.5  11.5 - 14.5 % Final  . Platelets 11/08/2015 265  150 - 440 K/uL Final  . Neutrophils Relative % 11/08/2015 53   Final  . Neutro Abs 11/08/2015 2.4  1.4 - 6.5 K/uL Final  . Lymphocytes Relative 11/08/2015 36   Final  . Lymphs Abs 11/08/2015 1.6  1.0 - 3.6 K/uL Final  . Monocytes Relative 11/08/2015 8   Final  . Monocytes Absolute 11/08/2015 0.3  0.2 - 0.9 K/uL Final  . Eosinophils Relative 11/08/2015 1   Final  . Eosinophils Absolute 11/08/2015 0.1  0 - 0.7  K/uL Final  . Basophils Relative 11/08/2015 2   Final  . Basophils Absolute 11/08/2015 0.1  0 - 0.1 K/uL Final  . Sodium 11/08/2015 135  135 - 145 mmol/L Final  . Potassium 11/08/2015 3.2* 3.5 - 5.1 mmol/L Final  . Chloride 11/08/2015 99* 101 - 111 mmol/L Final  . CO2 11/08/2015 27  22 - 32 mmol/L Final  . Glucose, Bld 11/08/2015 107* 65 - 99 mg/dL Final  . BUN 11/08/2015 14  6 - 20 mg/dL Final  . Creatinine, Ser 11/08/2015 0.81  0.44 - 1.00 mg/dL Final  . Calcium 11/08/2015 9.5  8.9 - 10.3 mg/dL Final  . Total Protein 11/08/2015 8.0  6.5 - 8.1 g/dL Final  . Albumin 11/08/2015 4.3  3.5 - 5.0 g/dL Final  . AST 11/08/2015 29  15 - 41 U/L Final  . ALT 11/08/2015 20  14 - 54 U/L Final  . Alkaline Phosphatase 11/08/2015 72  38 - 126 U/L Final  . Total Bilirubin 11/08/2015 0.9  0.3 - 1.2 mg/dL Final  . GFR calc non Af Amer 11/08/2015 >60  >60 mL/min Final  . GFR calc Af Amer 11/08/2015 >60  >60 mL/min Final   Comment: (NOTE) The eGFR has been calculated using the CKD EPI equation. This calculation has not been validated in all clinical situations. eGFR's persistently <60 mL/min signify possible Chronic Kidney Disease.   . Anion gap 11/08/2015 9  5 - 15 Final  . CA 27.29 11/08/2015 14.8  0.0 - 38.6 U/mL  Final   Comment: (NOTE) [employer]/ACS methodology Performed At: [facility] [place], [place] [id number] Lindon Romp MD [place]:[id number]        S ASSESSMENT:  MEDICAL DECISION MAKING:  1.  Shortness of breath on exertion.  We will get a chest x-ray.  Evaluate chest x-ray decide any need for a CT scan 2.  Hypertension.  His son is on antihypertensive medication patient was advised right away to go to [place] clinic to primary care physician for evaluation  4.  Carcinoma breast will get another mammogram done there is no clinical evidence of recurrent disease 5.  Left upper extremity lymphedema CT scan of chest has been reviewed.  There is no evidence of recurrent or progressive disease.  No evidence of pulmonary embolism Emphysema I discussed situation with patient.  Patient has been referred to pulmonologist for management of emphysema.  Patient was encouraged to use oxygen inhaler and may benefit from pulmonary rehabilitation therapy Evaluate patient in 6 months Duration of visit is 25 minutes and 50% of time was spent discussing VARIOUS  options or other physicians social worker Patient expressed understanding and was in agreement with this plan. She also understands that She can call clinic at any time with any questions, concerns, or complaints.    No matching staging information was found for the patient.  Forest Gleason, MD   11/14/2015 2:38 PM

## 2015-12-14 ENCOUNTER — Ambulatory Visit: Payer: BLUE CROSS/BLUE SHIELD | Admitting: Internal Medicine

## 2016-01-22 IMAGING — CT CT CHEST W/O CM
2 of 3 series · 15 of 36 positions shown, 18 images · non-contrast
Comparison: CT chest 02/23/2010.

CLINICAL DATA: Left breast cancer.  Follow-up chest radiograph.

EXAM:
CT CHEST WITHOUT CONTRAST
TECHNIQUE: Multidetector CT imaging of the chest was performed following the
standard protocol without IV contrast..

[Series 2: routine chest wo · axial · 0.68mm/px · z∈[+1185,+1445]mm · 12 of 62 slices shown, 15 images]
[im 5/62  mediastinal]
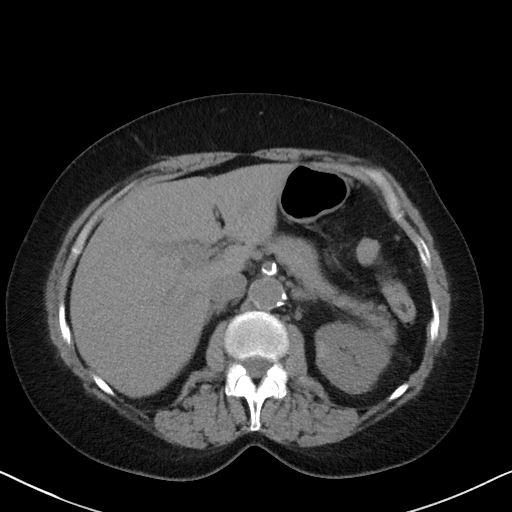
[im 5/62  lung]
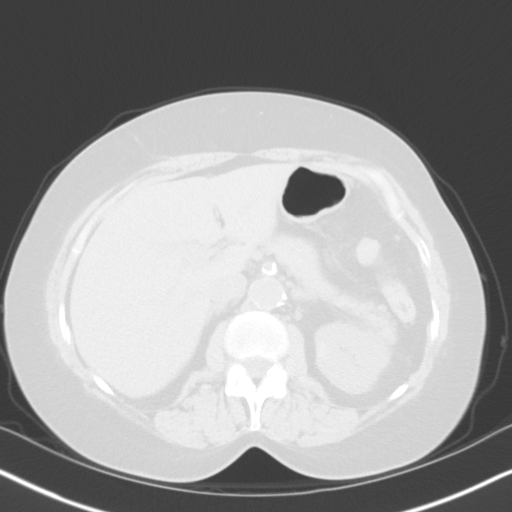
[im 10/62  lung]
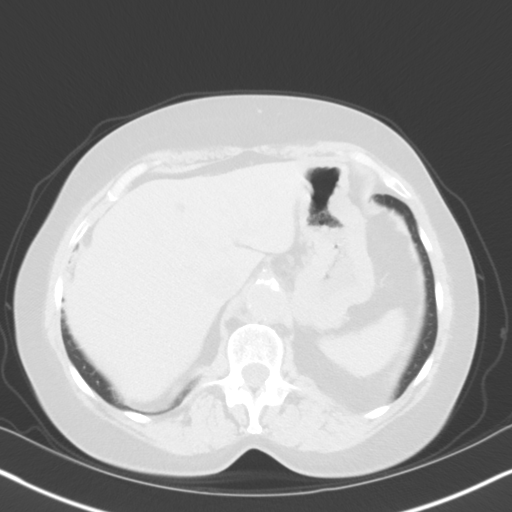
[im 14/62  lung]
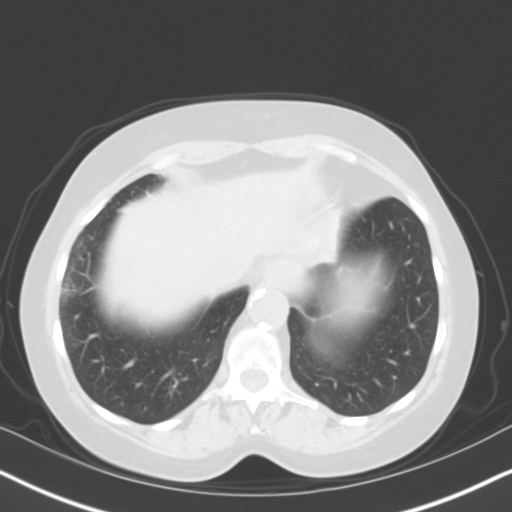
[im 19/62  lung]
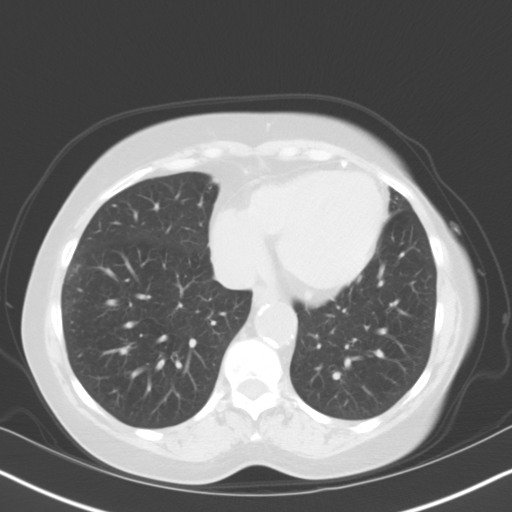
[im 23/62  mediastinal]
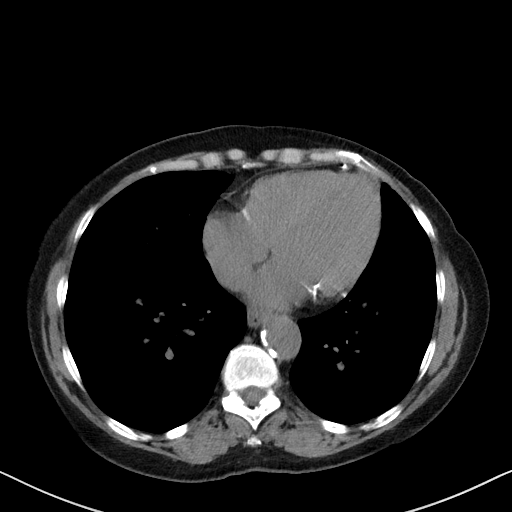
[im 23/62  lung]
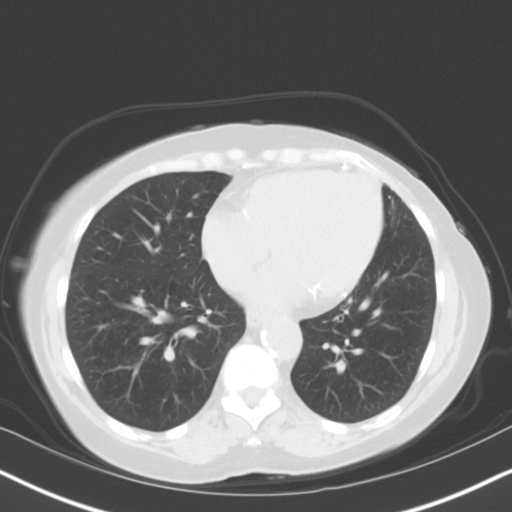
[im 28/62  lung]
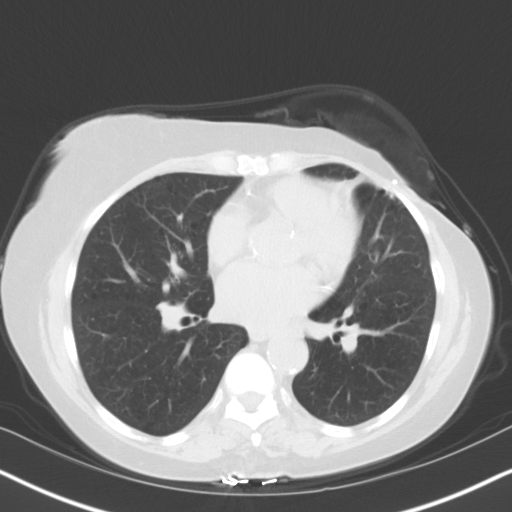
[im 34/62  lung]
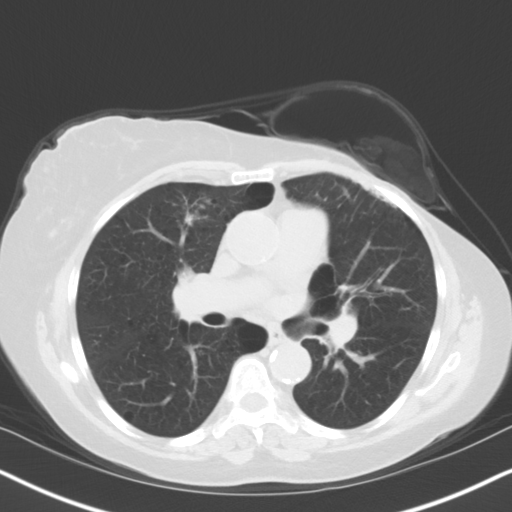
[im 39/62  lung]
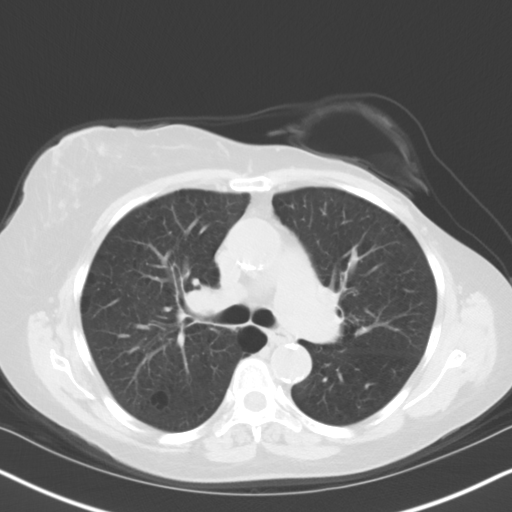
[im 43/62  mediastinal]
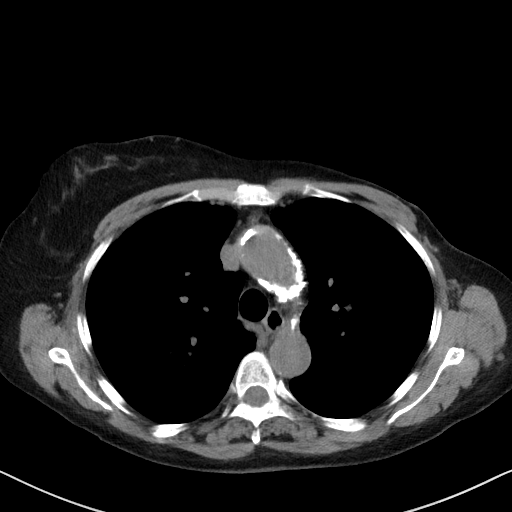
[im 43/62  lung]
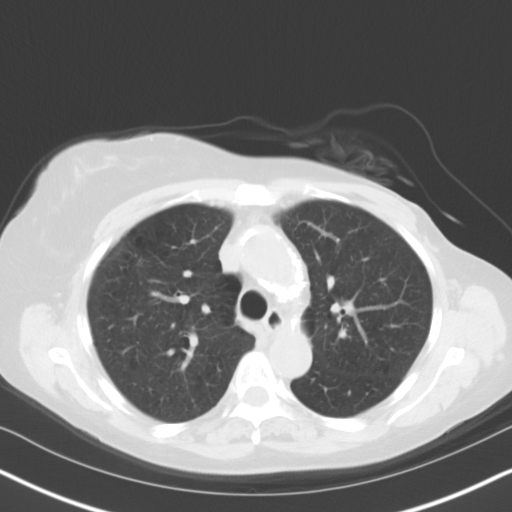
[im 48/62  lung]
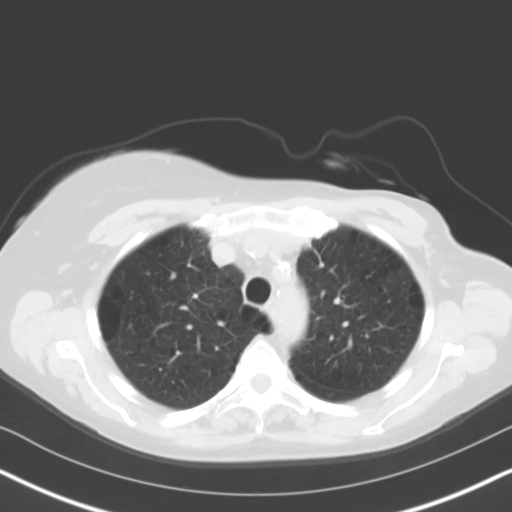
[im 52/62  lung]
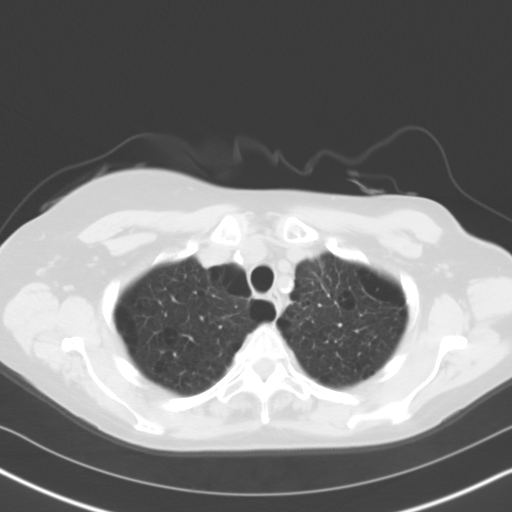
[im 57/62  lung]
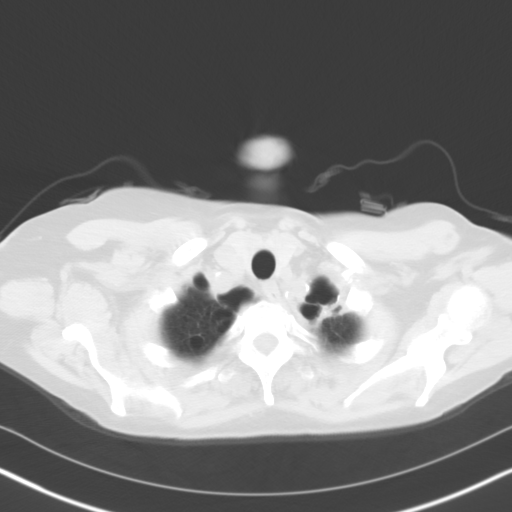

[Series 5: cor routine chest wo · coronal · 0.60mm/px · 3 of 131 slices shown]
[im 27/131  lung]
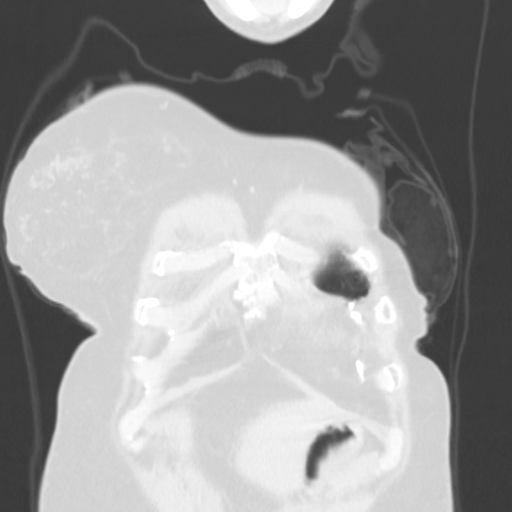
[im 53/131  lung]
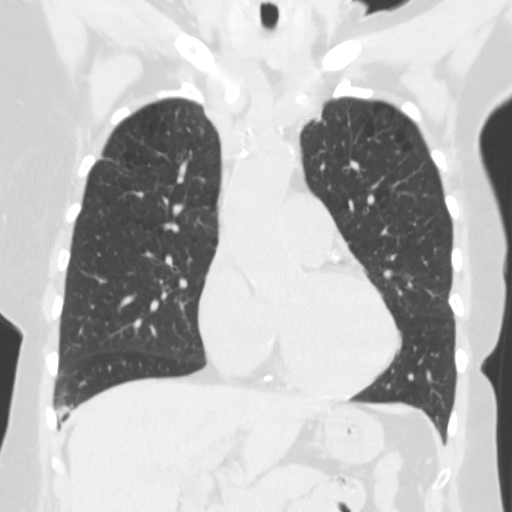
[im 79/131  lung]
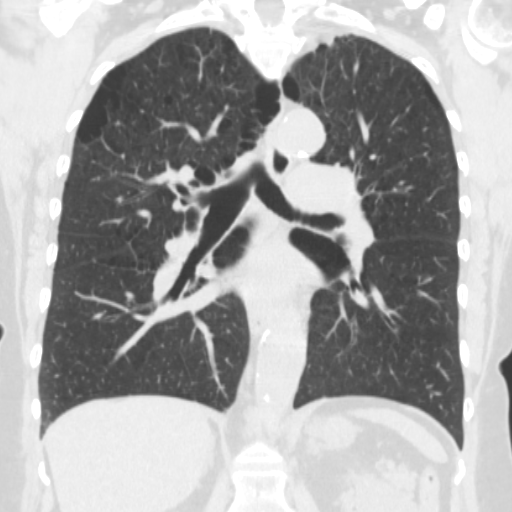

[15 of 36 positions shown; findings below may reference images not displayed]

FINDINGS: No pathologically enlarged mediastinal, axillary or internal mammary
lymph nodes. Hilar regions are difficult to definitively evaluate
without IV contrast. Postoperative changes of left mastectomy.
Extensive 3 vessel coronary artery calcification. Heart size normal.
No pericardial effusion.

Centrilobular and paraseptal emphysema with bullous components in
the apices. A new spiculated nodule in the anterior segment right
upper lobe measures 9 mm (6 x 11 mm), image 22. An irregular and
somewhat amorphous area of nodularity in the anterior segment right
upper lobe measures approximately 2.2 x 2.3 cm (series 4, image 30),
previously, approximately 1.1 x 1.8 cm. 5 mm right upper lobe nodule
(image 30), new. Subpleural ground-glass in the anterolateral right
lower lobe is new and may be infectious or inflammatory in etiology
or due to scarring. It does not have masslike features. There is
scarring at the apex of the left upper lobe with a nodular
component, measuring 8 mm (series 4, image 7), new. Subpleural
radiation fibrosis in the anterior left lung. No pleural fluid.
Airway is unremarkable.

Incidental imaging of the upper abdomen shows slight irregularity of
the liver margin. 6 mm low-attenuation lesion in the left hepatic
lobe is unchanged and likely a cyst or hemangioma. Visualized
portions of the adrenal glands and right kidney are unremarkable. A
hyperdense lesion in the upper pole left kidney measures
approximately 10 mm, likely corresponding to a 10 mm low-attenuation
lesion on 02/23/2010. High density may be due to interval hemorrhage
or proteinaceous debris. Definitive characterization is difficult
due to size and lack of postcontrast imaging. Visualized portions of
the spleen, pancreas and stomach are grossly unremarkable. No upper
abdominal adenopathy.

No worrisome lytic or sclerotic lesions.
IMPRESSION: 1. Spiculated right upper lobe nodule is new. Irregular and somewhat
amorphous nodularity in the anterior segment right upper lobe is
progressive. Apical left upper lobe nodule is new. Findings are
worrisome for multifocal adenocarcinoma. PET may be of limited
clinical utility given the size and/or slow progression of these
lesions. These results will be called to the ordering clinician or
representative by the Radiologist Assistant, and communication
documented in the PACS or zVision Dashboard.
2. 5 mm right upper lobe nodule is new, nonspecific. Continued
attention on followup exams is warranted.
3. Extensive 3 vessel coronary artery calcification.
4. Slight irregularity of the liver margin is indicative of
cirrhosis.

## 2016-01-26 ENCOUNTER — Encounter: Payer: Self-pay | Admitting: *Deleted

## 2016-01-26 ENCOUNTER — Ambulatory Visit: Payer: BLUE CROSS/BLUE SHIELD | Admitting: Internal Medicine

## 2016-02-09 IMAGING — CT NM PET TUM IMG RESTAG (PS) SKULL BASE T - THIGH
1 of 10 series · 1 of 25 positions shown · non-contrast
Comparison: CT 09/06/2014

CLINICAL DATA: Subsequent treatment strategy for breast carcinoma.

EXAM:
NUCLEAR MEDICINE PET SKULL BASE TO THIGH
TECHNIQUE: 11.5 MCi F-18 FDG was injected intravenously. Full-ring PET imaging
was performed from the skull base to thigh after the radiotracer. CT
data was obtained and used for attenuation correction and anatomic
localization.
FASTING BLOOD GLUCOSE:  Value: 79 mg/dl

[Series 4: ct wb 5.0 b30f · axial · 5.0mm · 0.98mm/px · 1 of 290 slices shown]
[im 290/290  brain]
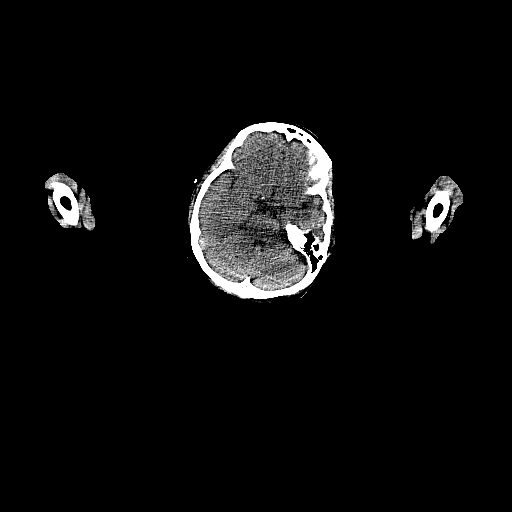

[1 of 25 positions shown; findings below may reference images not displayed]

FINDINGS: NECK

No hypermetabolic lymph nodes in the neck.

CHEST

Right upper lobe spiculated nodule measuring 8 mm on image 71 of the
CT data set has associated metabolic activity with SUV max 3.0. This
is fairly intense for the small size Dorothy this lesion.

The branching nodular densities in the right middle lobe on image 83
have mild metabolic activity suggesting inflammatory process.

There is no significant metabolic activity associated with the
nodular apical thickening at the left lung apex.

There is a hypermetabolic right axillary lymph node on image 50 of
fused data set. The lymph node associated with this activity is
small and has normal morphology. The activity is fairly intense with
SUV max = 4.0. Patient was injected with radiotracer on the right
side.

ABDOMEN/PELVIS

No abnormal hypermetabolic activity within the liver, pancreas,
adrenal glands, or spleen. No hypermetabolic lymph nodes in the
abdomen or pelvis.

SKELETON

No focal hypermetabolic activity to suggest skeletal metastasis.
IMPRESSION: 1. Metabolic activity associated with the small right upper lobe
spiculated nodule is consistent with malignancy. Differential would
include primary bronchogenic carcinoma versus metastatic breast
cancer. Favor lung cancer.
2. Mild metabolic activity associated with branching nodular pattern
within the right middle lobe likely is infectious or inflammatory
process. Cannot exclude a low-grade adenocarcinoma.
3. No significant metabolic activity associated nodular pleural
thickening in the left lung apex.
4. Intense metabolic activity associated with a small normal
morphology right axial lymph node. Favor this to represent benign
radiotracer accumulation in the right arm lymphatics following a
right antecubital radiotracer injection.

## 2016-02-14 ENCOUNTER — Ambulatory Visit (INDEPENDENT_AMBULATORY_CARE_PROVIDER_SITE_OTHER): Payer: BLUE CROSS/BLUE SHIELD | Admitting: Internal Medicine

## 2016-02-14 ENCOUNTER — Encounter: Payer: Self-pay | Admitting: Internal Medicine

## 2016-02-14 VITALS — BP 140/90 | HR 71 | Ht 65.5 in | Wt 146.0 lb

## 2016-02-14 DIAGNOSIS — R0609 Other forms of dyspnea: Secondary | ICD-10-CM | POA: Diagnosis not present

## 2016-02-14 DIAGNOSIS — J449 Chronic obstructive pulmonary disease, unspecified: Secondary | ICD-10-CM | POA: Diagnosis not present

## 2016-02-14 NOTE — Assessment & Plan Note (Signed)
Patient with moderate COPD which is currently stable Patient educated on proper use and administration of Symbicort. Again, she is advised to use it 2 puffs in the morning and 2 puffs at night. She was also educated on the use of Spiriva and her rescue inhaler. Her O2 with exertion will be further worked up by Dr. Raul Del office. Overall patient with stable, moderate COPD not requiring any stepup therapy at this time. CAT scan reviewed, moderate emphysema with stable right upper lobe and left apical opacities.  Plan: -Symbicort 160/4. 5-2 puffs twice a day, gargle and rinse after each use Continue with Spiriva Continue rescue inhaler Continuous supplemental oxygen, 2 L, with exertion, further determination of oxygenation needs to be determined by Dr. Gust Brooms office.

## 2016-02-14 NOTE — Patient Instructions (Signed)
Follow up with Dr. Stevenson Clinch as needed - Symbicort 160/4. 5-2 puffs in the morning, 2 puffs at night, gargle and rinse after each use -Continue with Spiriva -Continue follow-up with Dr. Raul Del office -Dr. Vella Kohler nurse will call you for a new walk test to determine O2 needs.

## 2016-02-14 NOTE — Assessment & Plan Note (Signed)
Multifactorial: COPD, deconditioning, emphysema,  Plan: -Continue with COPD regiment -Continue diet, exercise, weight loss line -continue tobacco avoidance

## 2016-02-14 NOTE — Progress Notes (Signed)
[facility] Consultation    Date: 02/14/2016  MRN# [id number] Danielle [name] [birth year]  Referring Physician: Dr. Jeb Levering Primary Pulmonologist - Dr. Erik Obey Danielle [name] is a 60 y.o. old female seen in consultation for COPD optimization  CC:  Chief Complaint  Patient presents with  . Advice Only    SOB w/any exertion; cough, unable to cough up mucus;     HPI:  Patient is a pleasant 60 year old female past medical history of left breast cancer status post mastectomy/chemotherapy/radiation, lung cancer status post radiation, moderate to severe COPD, diabetes seen in consultation for COPD optimization. Off note, patient follows with Dr. Vella Kohler. She last saw Dr. Vella Kohler, on 01/04/2016 and also has spirometry at that time, noted to have moderate to severe obstruction. Patient initially presented to her oncologist back in January 2017, at that time it was noted she had COPD with shortness of breath. Since then she has seen her primary lung doctor for COPD optimization, she has been started on Symbicort and Spiriva. Since initiation of these medications she states that her breathing has actually improving stabilized, she does have a nonproductive daily cough, she does not use albuterol on a daily basis. Her primary pulmonologist has prescribed Symbicort 2 puffs twice a day, however she is using it 1 puff twice a day. She was hospitalized for COPD exacerbation in September 2014 after that point she was discharge on 2 L of oxygen with exertion, which she has been using intermittently since then. She quit smoking in 2014, previously had a 40-pack-year history. Overall she states that her breathing has been stable since she is on her inhalers, she has no new complaints, she had denies any fever, nausea, vomiting, significant weight loss.    PMHX:   Past Medical History  Diagnosis Date  . Cancer ([place]) left     breast cancer 2000  . Status post chemotherapy 2001    left  breast cancer  . Status post radiation therapy 2001    left breast cancer  . Arthritis   . Lung nodule   . Diabetes mellitus without complication [facility])    Surgical Hx:  Past Surgical History  Procedure Laterality Date  . Mastectomy Left     2000  . Lung biopsy    . Breast biospy    . Rotator cuff repair     Family Hx:  Family History  Problem Relation Age of Onset  . Breast cancer Mother 51  . Breast cancer Paternal Aunt 69   Social Hx:   Social History  Substance Use Topics  . Smoking status: Former Research scientist (life sciences)  . Smokeless tobacco: Never Used  . Alcohol Use: No   Medication:   Current Outpatient Rx  Name  Route  Sig  Dispense  Refill  . albuterol (PROAIR HFA) 108 (90 BASE) MCG/ACT inhaler   Inhalation   Inhale into the lungs.         . celecoxib (CELEBREX) 200 MG capsule   Oral   Take 1 capsule (200 mg total) by mouth daily.   30 capsule   0   . citalopram (CELEXA) 20 MG tablet   Oral   Take by mouth.         . cyclobenzaprine (FLEXERIL) 10 MG tablet   Oral   Take by mouth.         . losartan-hydrochlorothiazide (HYZAAR) 100-25 MG tablet   Oral   Take 1 tablet by mouth daily.   30 tablet  0   . potassium chloride SA (K-DUR,KLOR-CON) 20 MEQ tablet   Oral   Take 1 tablet (20 mEq total) by mouth daily.   30 tablet   6       Allergies:  Review of patient's allergies indicates no known allergies.  Review of Systems  Constitutional: Negative for fever and chills.  Eyes: Negative for blurred vision and double vision.  Respiratory: Positive for cough and shortness of breath. Negative for sputum production and wheezing.   Cardiovascular: Negative for chest pain.  Gastrointestinal: Negative for heartburn and nausea.  Genitourinary: Negative for dysuria.  Musculoskeletal: Negative for myalgias.  Skin: Negative for rash.  Neurological: Negative for dizziness and headaches.  Endo/Heme/Allergies: Does not bruise/bleed easily.    Psychiatric/Behavioral: Negative for depression.     Physical Examination:   VS: BP 140/90 mmHg  Pulse 71  Ht 5' 5.5" (1.664 m)  Wt 146 lb (66.225 kg)  BMI 23.92 kg/m2  SpO2 93%  General Appearance: No distress  Neuro:without focal findings, mental status, speech normal, alert and oriented, cranial nerves 2-12 intact, reflexes normal and symmetric, sensation grossly normal  HEENT: PERRLA, EOM intact, no ptosis, no other lesions noticed; Mallampati 3 Pulmonary: normal breath sounds., diaphragmatic excursion normal.No wheezing, No rales;   Sputum Production:   CardiovascularNormal S1,S2.  No m/r/g.  Abdominal aorta pulsation normal.    Abdomen: Benign, Soft, non-tender, No masses, hepatosplenomegaly, No lymphadenopathy Renal:  No costovertebral tenderness  GU:  No performed at this time. Endoc: No evident thyromegaly, no signs of acromegaly or Cushing features Skin:   warm, no rashes, no ecchymosis  Extremities: normal, no cyanosis, clubbing, no edema, warm with normal capillary refill. Other findings:none   Labs results:   Rad results: (The following images and results were reviewed by Dr. Stevenson Clinch on 02/14/2016). CT Chest 10/2015 IMPRESSION:  1. Negative for acute PE or thoracic aortic dissection. 2. Atherosclerosis, including aortic and coronary artery disease. Please note that although the presence of coronary artery calcium documents the presence of coronary artery disease, the severity of this disease and any potential stenosis cannot be assessed on this non-gated CT examination. Assessment for potential risk factor modification, dietary therapy or pharmacologic therapy may be warranted, if clinically indicated. 3. Emphysema with stable right upper lobe and left apical opacities as before.   Spirometry from Dr. Gust Brooms notes SPIROMETRY: FVC was 2.09 liters, 82% of predicted FEV1 was 1.03, 50% of predicted FEV1 ratio was 49 FEF 25-75% liters per second was 10% of  predicted  LUNG VOLUMES: TLC was 76% of predicted RV was 91% of predicted  DIFFUSION CAPACITY: DLCO was 40% of predicted DLCO/VA was 64% of predicted  FLOW VOLUME LOOP: Expiratory flow volume loop isdelayed  Impression Severe obstruction on pfts TLC is slightly delayed DLCO is severely decreased but corrects for VA  *Compared to Previous Study, numbers are slightly declined.  Assessment and Plan: 60 year old female seen in consultation for COPD optimization Moderate COPD (chronic obstructive pulmonary disease) ([place]) Patient with moderate COPD which is currently stable Patient educated on proper use and administration of Symbicort. Again, she is advised to use it 2 puffs in the morning and 2 puffs at night. She was also educated on the use of Spiriva and her rescue inhaler. Her O2 with exertion will be further worked up by Dr. Raul Del office. Overall patient with stable, moderate COPD not requiring any stepup therapy at this time. CAT scan reviewed, moderate emphysema with stable right upper lobe and left apical  opacities.  Plan: -Symbicort 160/4. 5-2 puffs twice a day, gargle and rinse after each use Continue with Spiriva Continue rescue inhaler Continuous supplemental oxygen, 2 L, with exertion, further determination of oxygenation needs to be determined by Dr. Gust Brooms office.  DOE (dyspnea on exertion) Multifactorial: COPD, deconditioning, emphysema,  Plan: -Continue with COPD regiment -Continue diet, exercise, weight loss line -continue tobacco avoidance    Updated Medication List Outpatient Encounter Prescriptions as of 02/14/2016  Medication Sig  . albuterol (PROAIR HFA) 108 (90 BASE) MCG/ACT inhaler Inhale into the lungs.  . celecoxib (CELEBREX) 200 MG capsule Take 1 capsule (200 mg total) by mouth daily.  . citalopram (CELEXA) 20 MG tablet Take by mouth.  . cyclobenzaprine (FLEXERIL) 10 MG tablet Take by mouth.  . losartan-hydrochlorothiazide (HYZAAR) 100-25  MG tablet Take 1 tablet by mouth daily.  . potassium chloride SA (K-DUR,KLOR-CON) 20 MEQ tablet Take 1 tablet (20 mEq total) by mouth daily.   No facility-administered encounter medications on file as of 02/14/2016.    Orders for this visit: No orders of the defined types were placed in this encounter.     Thank  you for the consultation and for allowing [facility] to assist in the care of your patient. Our recommendations are noted above.  Please contact us if we can be of further service.   Vilinda Boehringer, MD [facility] Office Number: [phone]  Note: This note was prepared with Dragon dictation along with smaller phrase technology. Any transcriptional errors that result from this process are unintentional.

## 2016-02-15 ENCOUNTER — Ambulatory Visit: Admission: RE | Admit: 2016-02-15 | Payer: BLUE CROSS/BLUE SHIELD | Source: Ambulatory Visit

## 2016-02-21 ENCOUNTER — Encounter: Payer: Self-pay | Admitting: Family Medicine

## 2016-02-21 ENCOUNTER — Inpatient Hospital Stay: Payer: BLUE CROSS/BLUE SHIELD

## 2016-02-21 ENCOUNTER — Inpatient Hospital Stay (HOSPITAL_BASED_OUTPATIENT_CLINIC_OR_DEPARTMENT_OTHER): Payer: BLUE CROSS/BLUE SHIELD | Admitting: Family Medicine

## 2016-02-21 ENCOUNTER — Inpatient Hospital Stay: Payer: BLUE CROSS/BLUE SHIELD | Attending: Family Medicine

## 2016-02-21 ENCOUNTER — Inpatient Hospital Stay: Payer: BLUE CROSS/BLUE SHIELD | Admitting: Oncology

## 2016-02-21 VITALS — BP 167/101 | HR 94 | Temp 96.6°F | Resp 18 | Wt 149.5 lb

## 2016-02-21 DIAGNOSIS — Z87891 Personal history of nicotine dependence: Secondary | ICD-10-CM | POA: Insufficient documentation

## 2016-02-21 DIAGNOSIS — Z923 Personal history of irradiation: Secondary | ICD-10-CM | POA: Insufficient documentation

## 2016-02-21 DIAGNOSIS — Z803 Family history of malignant neoplasm of breast: Secondary | ICD-10-CM

## 2016-02-21 DIAGNOSIS — C50911 Malignant neoplasm of unspecified site of right female breast: Secondary | ICD-10-CM

## 2016-02-21 DIAGNOSIS — Z79899 Other long term (current) drug therapy: Secondary | ICD-10-CM | POA: Diagnosis not present

## 2016-02-21 DIAGNOSIS — M129 Arthropathy, unspecified: Secondary | ICD-10-CM | POA: Diagnosis not present

## 2016-02-21 DIAGNOSIS — Z853 Personal history of malignant neoplasm of breast: Secondary | ICD-10-CM | POA: Insufficient documentation

## 2016-02-21 DIAGNOSIS — C3411 Malignant neoplasm of upper lobe, right bronchus or lung: Secondary | ICD-10-CM | POA: Insufficient documentation

## 2016-02-21 DIAGNOSIS — R030 Elevated blood-pressure reading, without diagnosis of hypertension: Secondary | ICD-10-CM | POA: Diagnosis not present

## 2016-02-21 DIAGNOSIS — Z9221 Personal history of antineoplastic chemotherapy: Secondary | ICD-10-CM

## 2016-02-21 DIAGNOSIS — C3491 Malignant neoplasm of unspecified part of right bronchus or lung: Secondary | ICD-10-CM

## 2016-02-21 DIAGNOSIS — Z9012 Acquired absence of left breast and nipple: Secondary | ICD-10-CM

## 2016-02-21 DIAGNOSIS — E119 Type 2 diabetes mellitus without complications: Secondary | ICD-10-CM

## 2016-02-21 DIAGNOSIS — R918 Other nonspecific abnormal finding of lung field: Secondary | ICD-10-CM

## 2016-02-21 DIAGNOSIS — C50919 Malignant neoplasm of unspecified site of unspecified female breast: Secondary | ICD-10-CM | POA: Insufficient documentation

## 2016-02-21 HISTORY — DX: Malignant neoplasm of unspecified part of right bronchus or lung: C34.91

## 2016-02-21 LAB — CBC WITH DIFFERENTIAL/PLATELET
Basophils Absolute: 0.1 10*3/uL (ref 0–0.1)
Basophils Relative: 2 %
EOS ABS: 0 10*3/uL (ref 0–0.7)
EOS PCT: 0 %
HCT: 36.4 % (ref 35.0–47.0)
Hemoglobin: 12.3 g/dL (ref 12.0–16.0)
LYMPHS ABS: 2 10*3/uL (ref 1.0–3.6)
LYMPHS PCT: 30 %
MCH: 31.9 pg (ref 26.0–34.0)
MCHC: 33.7 g/dL (ref 32.0–36.0)
MCV: 94.5 fL (ref 80.0–100.0)
MONO ABS: 0.4 10*3/uL (ref 0.2–0.9)
MONOS PCT: 6 %
Neutro Abs: 4 10*3/uL (ref 1.4–6.5)
Neutrophils Relative %: 62 %
PLATELETS: 275 10*3/uL (ref 150–440)
RBC: 3.85 MIL/uL (ref 3.80–5.20)
RDW: 13.7 % (ref 11.5–14.5)
WBC: 6.5 10*3/uL (ref 3.6–11.0)

## 2016-02-21 LAB — COMPREHENSIVE METABOLIC PANEL
ALBUMIN: 4.5 g/dL (ref 3.5–5.0)
ALK PHOS: 80 U/L (ref 38–126)
ALT: 32 U/L (ref 14–54)
ANION GAP: 11 (ref 5–15)
AST: 46 U/L — AB (ref 15–41)
BILIRUBIN TOTAL: 0.8 mg/dL (ref 0.3–1.2)
BUN: 20 mg/dL (ref 6–20)
CALCIUM: 9.4 mg/dL (ref 8.9–10.3)
CO2: 27 mmol/L (ref 22–32)
Chloride: 97 mmol/L — ABNORMAL LOW (ref 101–111)
Creatinine, Ser: 0.84 mg/dL (ref 0.44–1.00)
GFR calc Af Amer: >60 mL/min (ref >60)
Glucose, Bld: 110 mg/dL — ABNORMAL HIGH (ref 65–99)
POTASSIUM: 3.6 mmol/L (ref 3.5–5.1)
Sodium: 135 mmol/L (ref 135–145)
TOTAL PROTEIN: 8.6 g/dL — AB (ref 6.5–8.1)

## 2016-02-21 NOTE — Progress Notes (Signed)
Offers no complaints. States feeling well. Has follow up appt scheduled with Dr. Raul Del on 5/17. Was supposed to have CT scan of chest on 5/3 but forgot about appt and did not show. Would like to be rescheduled for CT scan and also would like to be schedule for mammogram since she is due for one. Blood pressure is elevated today and pt states that BP has been elevated the past few weeks.

## 2016-02-21 NOTE — Progress Notes (Signed)
Danielle [name]  [phone]  [phone]     Danielle [name] DOB: Jul 04, 1956  MR#: [id number]  IZT#:[id number]  Patient Care Team: Marguerita Merles, MD as PCP - General (Family Medicine)  CHIEF COMPLAINT:  Chief Complaint  Patient presents with  . 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 Patient also has a significant past medical history of carcinoma of the left breast from 2000. She is status post mastectomy, chemotherapy, radiation, and completion of Aromasin in May 2010. She was supposed to have a follow-up CT scan for reevaluation of her lung cancer in May but missed her appointment. She would like to have that rescheduled and is also requesting [place] to schedule her mammogram. She reports overall feeling very well. She states that her blood pressure has been elevated for the last several weeks and she has been in contact with her primary care provider. She denies any shortness of breath, chest pain, headaches, symptoms of stroke.  REVIEW OF SYSTEMS:   Review of Systems  Constitutional: Negative for fever, chills, weight loss, malaise/fatigue and diaphoresis.  HENT: Negative.   Eyes: Negative.   Respiratory: Negative for cough, hemoptysis, sputum production, shortness of breath and wheezing.   Cardiovascular: Negative for chest pain, palpitations, orthopnea, claudication, leg swelling and PND.  Gastrointestinal: Negative for heartburn, nausea, vomiting, abdominal pain, diarrhea, constipation, blood in stool and melena.  Genitourinary: Negative.   Musculoskeletal: Negative.   Skin: Negative.   Neurological: Negative for dizziness, tingling, focal weakness, seizures and weakness.  Endo/Heme/Allergies: Does not bruise/bleed easily.  Psychiatric/Behavioral: Negative for depression. The patient is not nervous/anxious and does not have insomnia.      As per HPI. Otherwise, a complete review of systems is negatve.  ONCOLOGY HISTORY:   Breast cancer in female [facility])   07/24/1999 Initial Diagnosis Breast cancer in female [facility]) T3 N1 M0 tumor ER/PR positive   08/24/1999 -  Anti-estrogen oral therapy Started tamoxifen   08/24/1999 Surgery Status post modified radical mastectomy of left breast    Chemotherapy     Radiation Therapy    11/24/2003 - 02/20/2009 Anti-estrogen oral therapy Started Aromasin    Lung cancer, upper lobe ([place])   08/23/2014 Initial Diagnosis Lung cancer, upper lobe of right lung ([facility]) T1 N0 M0    PAST MEDICAL HISTORY: Past Medical History  Diagnosis Date  . Cancer ([place]) left     breast cancer 2000  . Status post chemotherapy 2001    left breast cancer  . Status post radiation therapy 2001    left breast cancer  . Arthritis   . Lung nodule   . Diabetes mellitus without complication ([place])   . Breast cancer in female [facility]) 02/21/2016    PAST SURGICAL HISTORY: Past Surgical History  Procedure Laterality Date  . Mastectomy Left     2000  . Lung biopsy    . Breast biospy    . Rotator cuff repair      FAMILY HISTORY Family History  Problem Relation Age of Onset  . Breast cancer Mother 106  . Breast cancer Paternal Aunt 80    GYNECOLOGIC HISTORY:  No LMP recorded. Patient is postmenopausal.     ADVANCED DIRECTIVES:    HEALTH MAINTENANCE: Social History  Substance Use Topics  . Smoking status: Former Research scientist (life sciences)  . Smokeless tobacco: Never Used  . Alcohol Use: No  Colonoscopy:  PAP:  Bone density:2007  Mammogram:June 2016  No Known Allergies  Current Outpatient Prescriptions  Medication Sig Dispense Refill  . albuterol (PROAIR HFA) 108 (90 BASE) MCG/ACT inhaler Inhale 1-2 puffs into the lungs every 6 (six) hours as needed for wheezing or shortness of breath.     . celecoxib (CELEBREX) 200 MG capsule Take 1 capsule (200 mg total) by mouth daily. 30 capsule 0  . citalopram (CELEXA) 20 MG  tablet Take 20 mg by mouth daily.     [name] losartan-hydrochlorothiazide (HYZAAR) 100-25 MG tablet Take 1 tablet by mouth daily. 30 tablet 0  . potassium chloride SA (K-DUR,KLOR-CON) 20 MEQ tablet Take 1 tablet (20 mEq total) by mouth daily. 30 tablet 6   No current facility-administered medications for this visit.    OBJECTIVE: BP 167/101 mmHg  Pulse 94  Temp(Src) 96.6 F (35.9 C) (Tympanic)  Resp 18  Wt 149 lb 7.6 oz (67.8 kg)   Body mass index is 24.49 kg/(m^2).    ECOG FS:0 - Asymptomatic  General: Well-developed, well-nourished, no acute distress. Eyes: Pink conjunctiva, anicteric sclera. HEENT: Normocephalic, moist mucous membranes, clear oropharnyx. Lungs: Clear to auscultation bilaterally. Heart: Regular rate and rhythm. No rubs, murmurs, or gallops. Abdomen: Soft, nontender, nondistended. No organomegaly noted, normoactive bowel sounds. Musculoskeletal: No edema, cyanosis, or clubbing. Neuro: Alert, answering all questions appropriately. Cranial nerves grossly intact. Skin: No rashes or petechiae noted. Psych: Normal affect. Lymphatics: No cervical, clavicular LAD.   LAB RESULTS:  Appointment on 02/21/2016  Component Date Value Ref Range Status  . WBC 02/21/2016 6.5  3.6 - 11.0 K/uL Final  . RBC 02/21/2016 3.85  3.80 - 5.20 MIL/uL Final  . Hemoglobin 02/21/2016 12.3  12.0 - 16.0 g/dL Final  . HCT 02/21/2016 36.4  35.0 - 47.0 % Final  . MCV 02/21/2016 94.5  80.0 - 100.0 fL Final  . MCH 02/21/2016 31.9  26.0 - 34.0 pg Final  . MCHC 02/21/2016 33.7  32.0 - 36.0 g/dL Final  . RDW 02/21/2016 13.7  11.5 - 14.5 % Final  . Platelets 02/21/2016 275  150 - 440 K/uL Final  . Neutrophils Relative % 02/21/2016 62   Final  . Neutro Abs 02/21/2016 4.0  1.4 - 6.5 K/uL Final  . Lymphocytes Relative 02/21/2016 30   Final  . Lymphs Abs 02/21/2016 2.0  1.0 - 3.6 K/uL Final  . Monocytes Relative 02/21/2016 6   Final  . Monocytes Absolute 02/21/2016 0.4  0.2 - 0.9 K/uL Final  .  Eosinophils Relative 02/21/2016 0   Final  . Eosinophils Absolute 02/21/2016 0.0  0 - 0.7 K/uL Final  . Basophils Relative 02/21/2016 2   Final  . Basophils Absolute 02/21/2016 0.1  0 - 0.1 K/uL Final  . Sodium 02/21/2016 135  135 - 145 mmol/L Final  . Potassium 02/21/2016 3.6  3.5 - 5.1 mmol/L Final  . Chloride 02/21/2016 97* 101 - 111 mmol/L Final  . CO2 02/21/2016 27  22 - 32 mmol/L Final  . Glucose, Bld 02/21/2016 110* 65 - 99 mg/dL Final  . BUN 02/21/2016 20  6 - 20 mg/dL Final  . Creatinine, Ser 02/21/2016 0.84  0.44 - 1.00 mg/dL Final  . Calcium 02/21/2016 9.4  8.9 - 10.3 mg/dL Final  . Total Protein 02/21/2016 8.6* 6.5 - 8.1 g/dL Final  . Albumin 02/21/2016 4.5  3.5 - 5.0 g/dL Final  . AST 02/21/2016 46* 15 - 41 U/L Final  . ALT 02/21/2016 32  14 -  54 U/L Final  . Alkaline Phosphatase 02/21/2016 80  38 - 126 U/L Final  . Total Bilirubin 02/21/2016 0.8  0.3 - 1.2 mg/dL Final  . GFR calc non Af Amer 02/21/2016 >60  >60 mL/min Final  . GFR calc Af Amer 02/21/2016 >60  >60 mL/min Final   Comment: (NOTE) The eGFR has been calculated using the CKD EPI equation. This calculation has not been validated in all clinical situations. eGFR's persistently <60 mL/min signify possible Chronic Kidney Disease.   . Anion gap 02/21/2016 11  5 - 15 Final    STUDIES: No results found.  ASSESSMENT:  Carcinoma right upper lobe of lung, T1 N0 M0, stage I. History of left breast cancer, T3 N1 M0, stage IIIa, from 2000.  PLAN:   1. Carcinoma of right upper lobe. Patient has quit smoking approximately 3-1/2 years ago. Patient completed [facility] team radiation therapy. She missed her most recent appointment for chest CT for reevaluation the beginning of May. We'll reschedule the CT scan to determine if there is any evidence of progressive or recurrent disease. 2. History of left breast cancer. She is status post left mastectomy, completion of radiation therapy, chemotherapy, as well as anti-hormone  therapy. We will schedule mammogram of right unilateral breast in June 2017. 3. Hypertension. Patient with elevated blood pressure 167/101 today. Advised patient that she needed to be seen by primary care provider at [facility] clinic as soon as possible. Patient agreed.  Patient will return in approximately 4 months for continued evaluation. We'll contact patient following CT scan in regards to results. Patient expressed understanding and was in agreement with this plan. She also understands that She can call clinic at any time with any questions, concerns, or complaints.   Dr. Grayland Ormond was available for consultation and review of plan of care for this patient.    [name], NP   02/21/2016 2:17 PM

## 2016-02-28 ENCOUNTER — Ambulatory Visit
Admission: RE | Admit: 2016-02-28 | Discharge: 2016-02-28 | Disposition: A | Discharge status: Home / Self Care | Payer: BLUE CROSS/BLUE SHIELD | Source: Ambulatory Visit | Attending: Oncology | Admitting: Oncology

## 2016-02-28 DIAGNOSIS — J439 Emphysema, unspecified: Secondary | ICD-10-CM | POA: Diagnosis not present

## 2016-02-28 DIAGNOSIS — I2510 Atherosclerotic heart disease of native coronary artery without angina pectoris: Secondary | ICD-10-CM | POA: Insufficient documentation

## 2016-02-28 DIAGNOSIS — I700 Atherosclerosis of aorta: Secondary | ICD-10-CM | POA: Diagnosis not present

## 2016-02-28 DIAGNOSIS — R918 Other nonspecific abnormal finding of lung field: Secondary | ICD-10-CM | POA: Diagnosis present

## 2016-03-07 IMAGING — CR DG CHEST 1V PORT
1 series · 1 of 1 positions shown · non-contrast
Comparison: Biopsy images obtained earlier today; prior chest x-ray
12/06/2013

CLINICAL DATA: 58-year-old female status post biopsy of right upper
lobe pulmonary nodule. Evaluate for pneumothorax or other
complication.

EXAM:
PORTABLE CHEST - 1 VIEW

[ap]
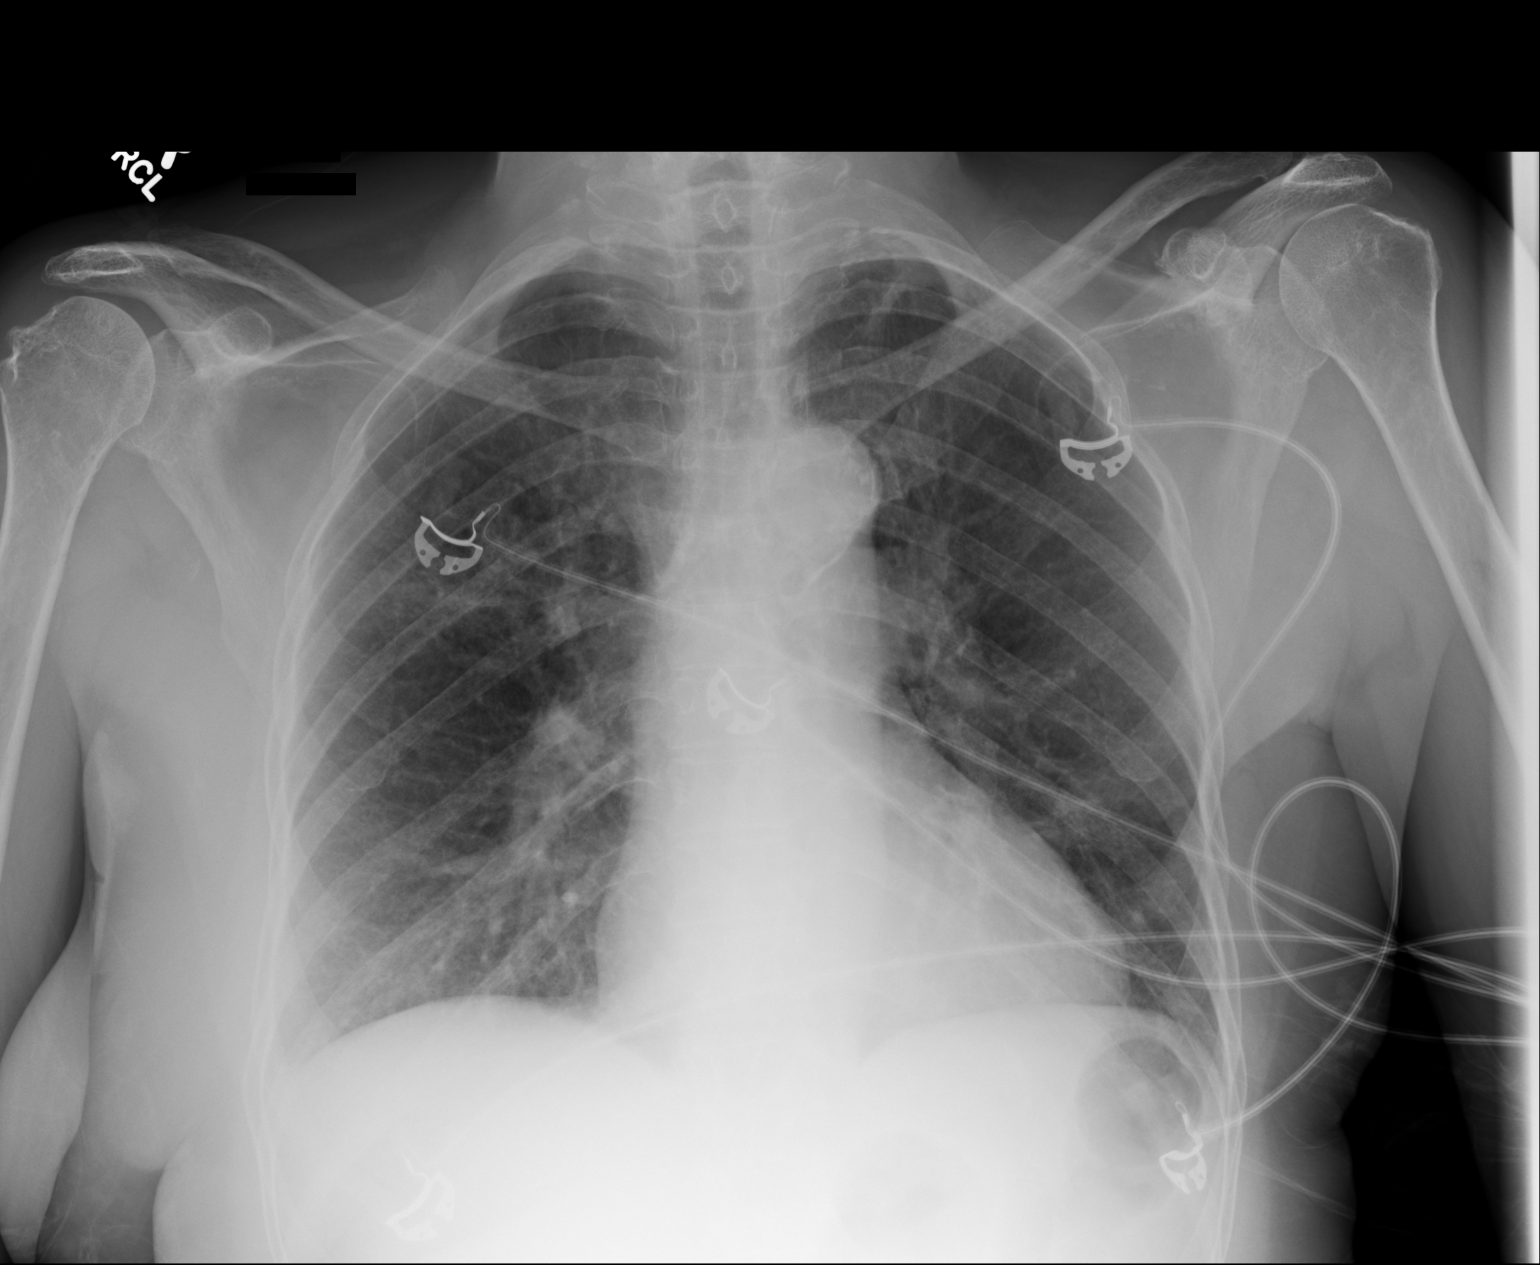

[1 of 1 positions shown; findings below may reference images not displayed]

FINDINGS: No evidence of pneumothorax, hemothorax or other acute complication.
Trace patchy airspace opacity in the right upper lung consistent
with mild. Lesional alveolar hemorrhage. Cardiac and mediastinal
contours are within normal limits. Atherosclerotic calcifications
present in the transverse aorta. Background changes of emphysema and
chronic bronchial wall thickening. No acute osseous abnormality.
IMPRESSION: No evidence of pneumothorax following right upper lobe lung biopsy.

## 2016-03-07 IMAGING — CT CT ASPIRATION
2 of 3 series · 22 of 32 positions shown, 28 images · non-contrast
Comparison: none

CLINICAL DATA: 58-year-old female smoker with a remote history of
breast cancer (0222) and a hyper metabolic 8 mm pulmonary nodule in
the right upper lobe concerning for primary bronchogenic carcinoma
versus metastatic breast cancer. Primary bronchogenic cancer is
favored. Tissue biopsy is warranted to facilitate diagnosis.
TECHNIQUE: Informed consent was obtained from the patient following explanation
of the procedure, risks, benefits and alternatives. The patient
understands, agrees and consents for the procedure. All questions
were addressed. A time out was performed.

[Series 2: routine chest · axial · 0.60mm/px · z∈[-52,+34]mm · 8 of 37 slices shown]
[im 4/37  soft-tissue]
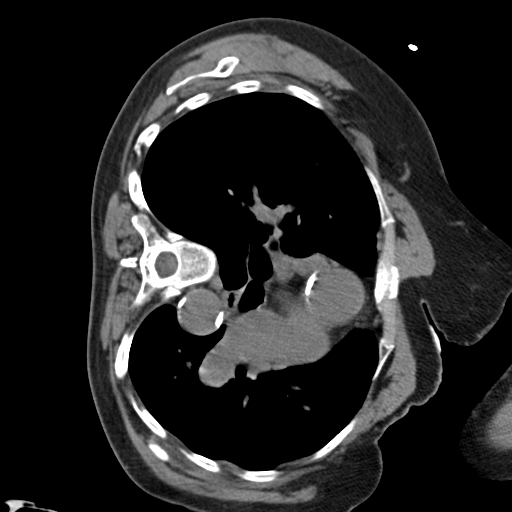
[im 8/37  soft-tissue]
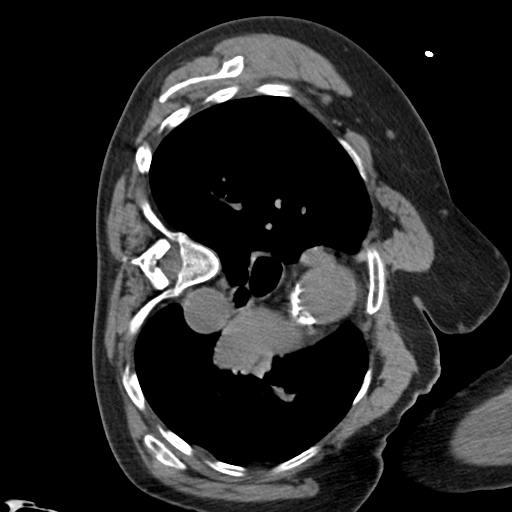
[im 11/37  soft-tissue]
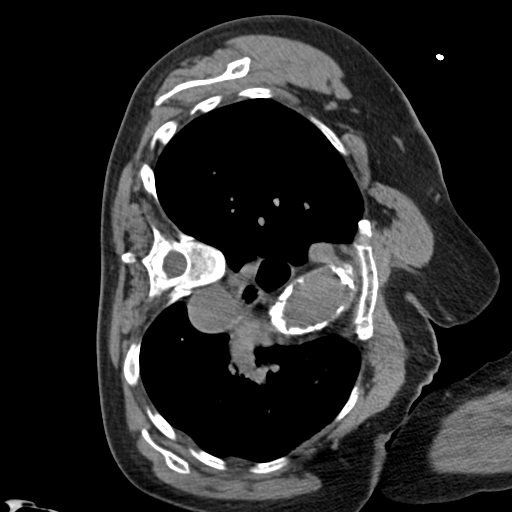
[im 15/37  soft-tissue]
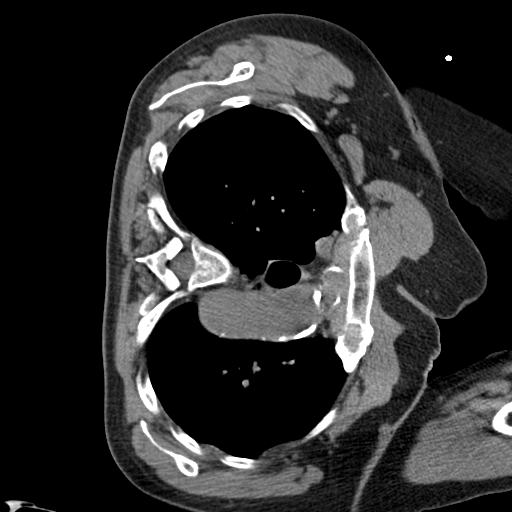
[im 22/37  soft-tissue]
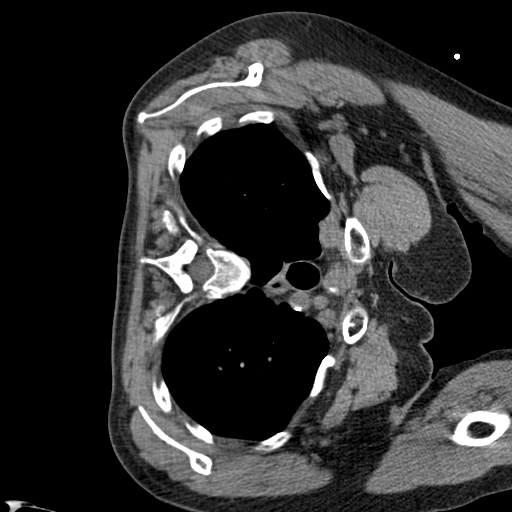
[im 26/37  soft-tissue]
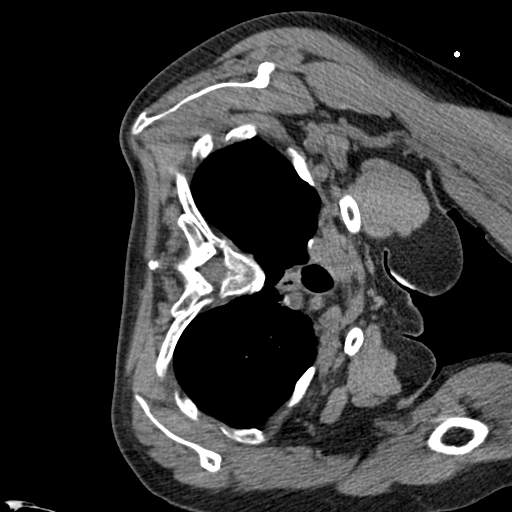
[im 29/37  soft-tissue]
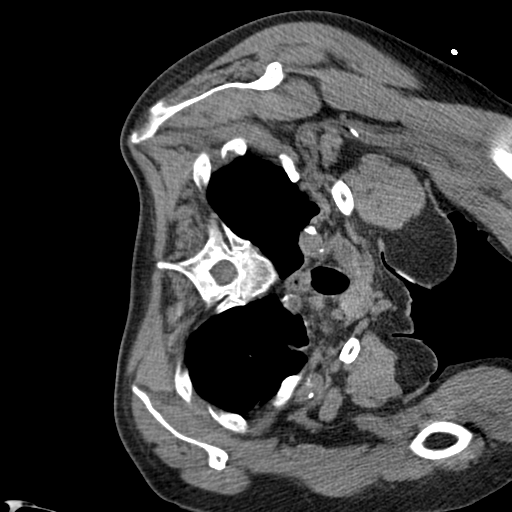
[im 33/37  soft-tissue]
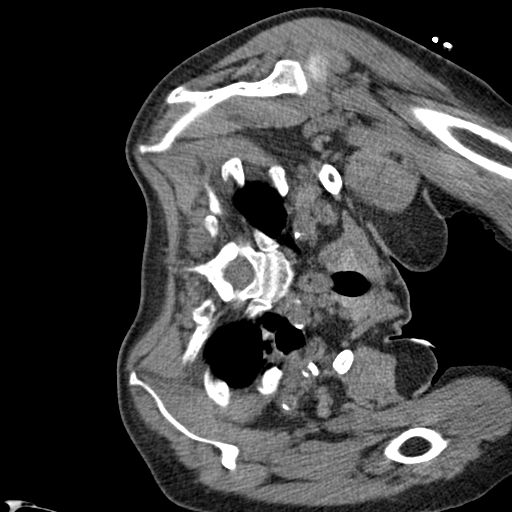

[Series 5: (hospital) 4.8 b30s · axial · 0.86mm/px · z∈[-52,-48]mm · 14 of 56 slices shown, 20 images]
[im 4/56  soft-tissue]
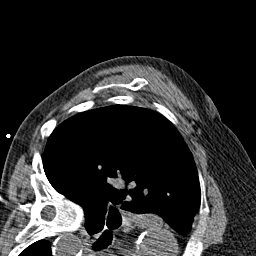
[im 4/56  lung]
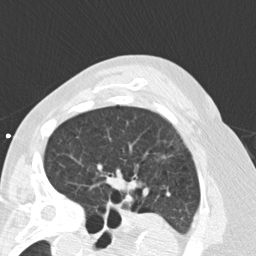
[im 4/56  bone]
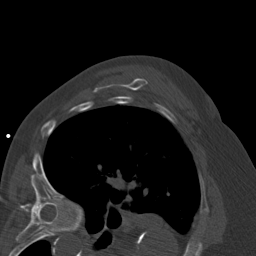
[im 8/56  soft-tissue]
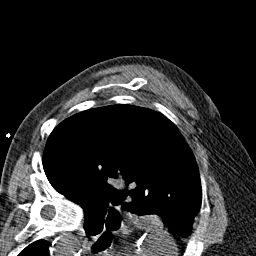
[im 12/56  soft-tissue]
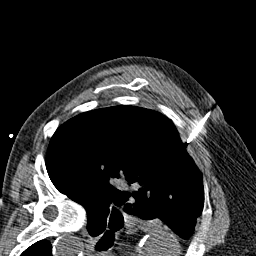
[im 15/56  soft-tissue]
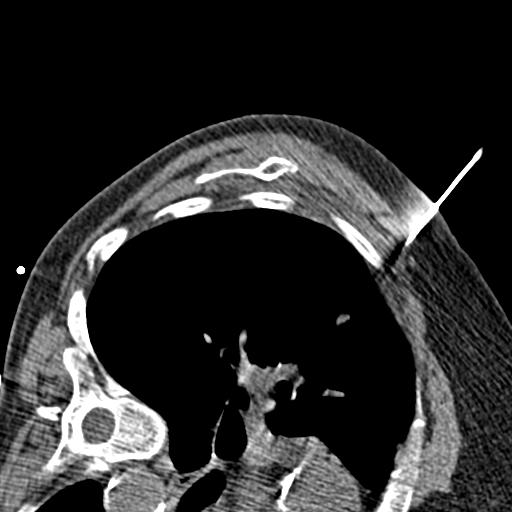
[im 19/56  soft-tissue]
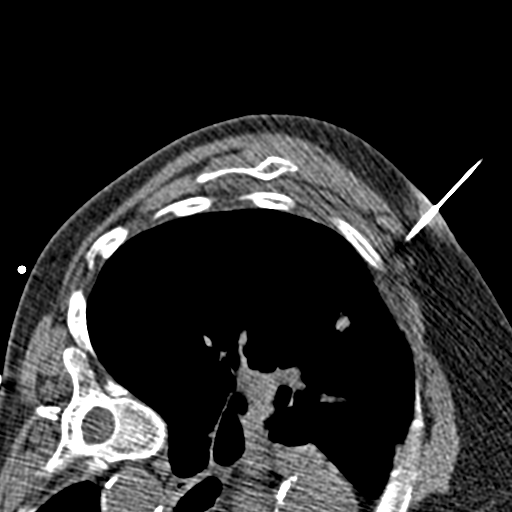
[im 23/56  soft-tissue]
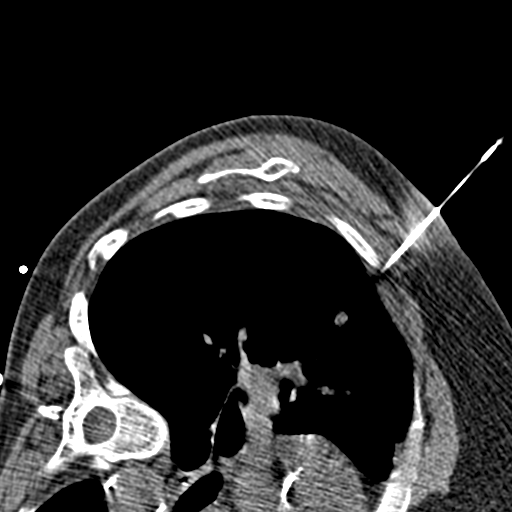
[im 26/56  soft-tissue]
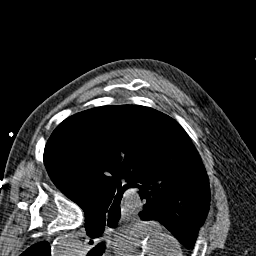
[im 30/56  soft-tissue]
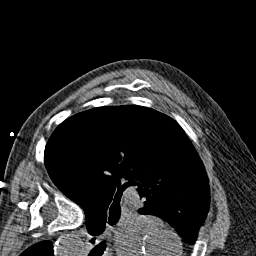
[im 34/56  soft-tissue]
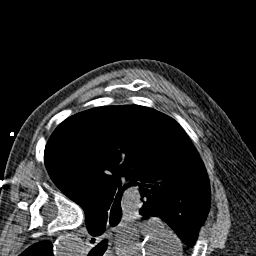
[im 34/56  bone]
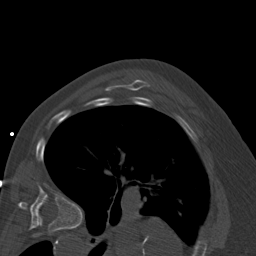
[im 37/56  soft-tissue]
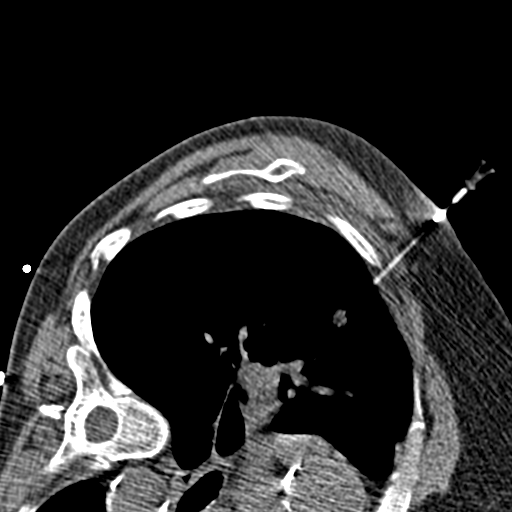
[im 41/56  soft-tissue]
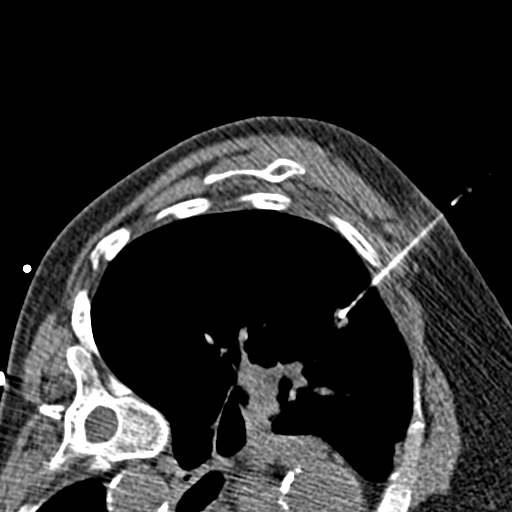
[im 45/56  soft-tissue]
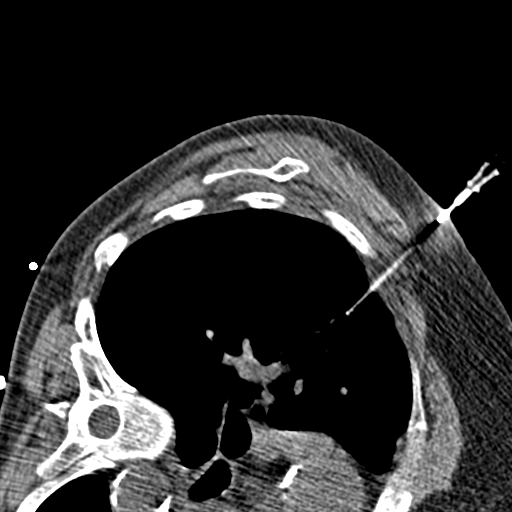
[im 45/56  lung]
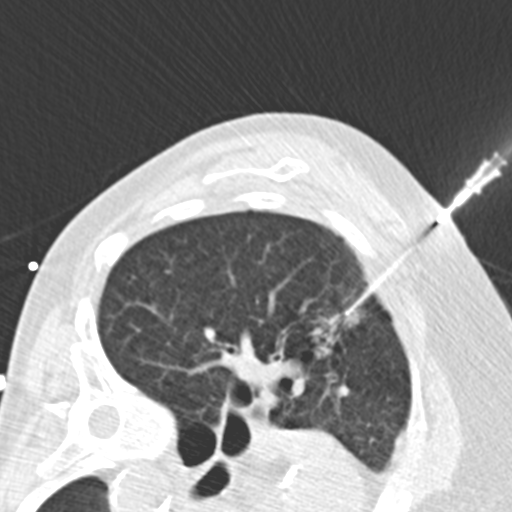
[im 48/56  soft-tissue]
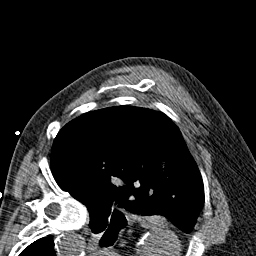
[im 48/56  lung]
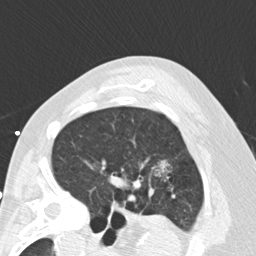
[im 52/56  soft-tissue]
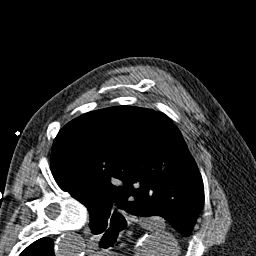
[im 52/56  lung]
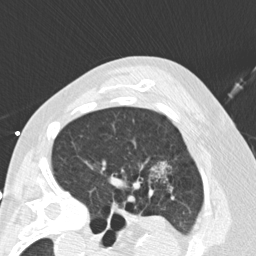

[22 of 32 positions shown; findings below may reference images not displayed]

EXAM:
CT BIOPSY

Date: 10/21/2014

PROCEDURE:
1. CT-guided lung biopsy

ANESTHESIA/SEDATION:
Moderate (conscious) sedation was used. 2.5 mg Versed, 75 mcg
Fentanyl were administered intravenously. The patient's vital signs
were monitored continuously by radiology nursing throughout the
procedure.

Sedation Time: 15 minutes

MEDICATIONS:
None additional
And axial CT scan was performed.

The spiculated 8 mm right upper lobe pulmonary nodule is identified.
A suitable skin entry site was selected and marked. The region was
then sterilely prepped and draped in standard fashion using
chlorhexidine skin prep. Local anesthesia was attained by
infiltration with 1% lidocaine. Anesthesia was carried down to the
pleural surface.

Under intermittent CT fluoroscopic guidance, a 17 gauge introducer
needle was advanced and position at the margin of the nodule.
Several 18 gauge core biopsies were then coaxially obtained using
the Elieser Sohn automated biopsy device. As the introducer needle was
removed, an autologous blood patch was applied. Post biopsy axial CT
imaging demonstrates no evidence of a pneumothorax. There is
minimal. Lesional alveolar hemorrhage which is not unexpected. The
patient did not experience hemoptysis.

COMPLICATIONS:
None
IMPRESSION: Technically successful CT-guided core biopsy of 8 mm spiculated and
PET positive right upper lobe pulmonary nodule.

## 2016-03-14 ENCOUNTER — Other Ambulatory Visit: Payer: Self-pay | Admitting: Family Medicine

## 2016-03-20 ENCOUNTER — Inpatient Hospital Stay: Payer: BLUE CROSS/BLUE SHIELD | Attending: Oncology | Admitting: Oncology

## 2016-03-20 ENCOUNTER — Encounter: Payer: Self-pay | Admitting: Oncology

## 2016-03-20 VITALS — BP 127/86 | HR 96 | Temp 97.2°F | Resp 18 | Wt 143.5 lb

## 2016-03-20 DIAGNOSIS — M7989 Other specified soft tissue disorders: Secondary | ICD-10-CM | POA: Insufficient documentation

## 2016-03-20 DIAGNOSIS — R0602 Shortness of breath: Secondary | ICD-10-CM | POA: Diagnosis not present

## 2016-03-20 DIAGNOSIS — I10 Essential (primary) hypertension: Secondary | ICD-10-CM | POA: Insufficient documentation

## 2016-03-20 DIAGNOSIS — M129 Arthropathy, unspecified: Secondary | ICD-10-CM | POA: Insufficient documentation

## 2016-03-20 DIAGNOSIS — C3410 Malignant neoplasm of upper lobe, unspecified bronchus or lung: Secondary | ICD-10-CM

## 2016-03-20 DIAGNOSIS — M25511 Pain in right shoulder: Secondary | ICD-10-CM | POA: Diagnosis not present

## 2016-03-20 DIAGNOSIS — E119 Type 2 diabetes mellitus without complications: Secondary | ICD-10-CM | POA: Insufficient documentation

## 2016-03-20 DIAGNOSIS — F329 Major depressive disorder, single episode, unspecified: Secondary | ICD-10-CM | POA: Diagnosis not present

## 2016-03-20 DIAGNOSIS — Z923 Personal history of irradiation: Secondary | ICD-10-CM | POA: Insufficient documentation

## 2016-03-20 DIAGNOSIS — Z9221 Personal history of antineoplastic chemotherapy: Secondary | ICD-10-CM | POA: Diagnosis not present

## 2016-03-20 DIAGNOSIS — Z853 Personal history of malignant neoplasm of breast: Secondary | ICD-10-CM | POA: Diagnosis not present

## 2016-03-20 DIAGNOSIS — Z87891 Personal history of nicotine dependence: Secondary | ICD-10-CM | POA: Insufficient documentation

## 2016-03-20 DIAGNOSIS — C3411 Malignant neoplasm of upper lobe, right bronchus or lung: Secondary | ICD-10-CM

## 2016-03-20 DIAGNOSIS — Z79899 Other long term (current) drug therapy: Secondary | ICD-10-CM | POA: Insufficient documentation

## 2016-03-20 DIAGNOSIS — Z9011 Acquired absence of right breast and nipple: Secondary | ICD-10-CM | POA: Insufficient documentation

## 2016-03-20 DIAGNOSIS — C50911 Malignant neoplasm of unspecified site of right female breast: Secondary | ICD-10-CM

## 2016-03-20 DIAGNOSIS — Z803 Family history of malignant neoplasm of breast: Secondary | ICD-10-CM | POA: Insufficient documentation

## 2016-03-20 NOTE — Addendum Note (Signed)
Addended by: Telford Nab on: 03/20/2016 02:53 PM   Modules accepted: Orders

## 2016-03-20 NOTE — Progress Notes (Signed)
Patient states she saw PCP yesterday for elevated BP.  Changing BP meds - patient has to p/u from the pharmacy.  Also states she is having pain in her hips that wake her up in the night.

## 2016-03-20 NOTE — Addendum Note (Signed)
Addended by: Telford Nab on: 03/20/2016 02:57 PM   Modules accepted: Orders

## 2016-03-20 NOTE — Progress Notes (Signed)
Danielle [name] @ [facility] [phone]  Fax:([phone]) [place] [facility]: [birth year]  MR#: [id number]  ZHG#:[id number]  Patient Care Team: Marguerita Merles, MD as PCP - General (Family Medicine)  CHIEF COMPLAINT:  Chief Complaint  Patient presents with  . Lung Cancer   Subjective: Chief Complaint/Diagnosis:   Chief Complaint/Problem List  1.  Carcinoma of breast, T3N1M0 tumor. ER/PR positive.   2.  Status post Tamoxifen.   3.  Status post modified radical mastectomy in November of 2000.    4.  Status post chemotherapy.   5.  Status post radiation therapy.  6.  On Aromasin starting February of 2005. 7.  Stopped Aromasin in May of 2010 8.abnormal screening CT scan with right upper lobe spiculated nodule and right middle lobe nodule found in November of 2015 PET scan shows hypermetabolic activity in right upper lobe nodule.  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  Patient has stopped smoking.  Had history of breast cancer and lung cancer in the past.  Has a right shoulder pain and right elbow pain. Swelling of the left upper extremity.Danielle [name] Here for further follow-up and treatment consideration, Patient continues to shortness of breath.  Patient has oxygen at home but is not using it.  Shortness of breath is during exertion. She had a CT scan of chest done which has been reviewed in independently  REVIEW OF SYSTEMS:   GENERAL: She is somewhat anxious and apprehensive. PERFORMANCE STATUS (ECOG): 01 HEENT:  No visual changes, runny nose, sore throat, mouth sores or tenderness. Lungs: Shortness of breath particularly on exertion.  Patient has stopped smoking. Cardiac:  No chest pain, palpitations, orthopnea, or PND. GI:  No nausea, vomiting, diarrhea, constipation, melena or  hematochezia. GU:  No urgency, frequency, dysuria, or hematuria. Musculoskeletal:  No back pain.  No joint pain.  No muscle tenderness. Extremities:  No pain or swelling. Skin:  No rashes or skin changes. Neuro:  No headache, numbness or weakness, balance or coordination issues. Endocrine:  No diabetes, thyroid issues, hot flashes or night sweats. Psych:  No mood changes, depression or anxiety. Pain:  No focal pain. Review of systems:  All other systems reviewed and found to be negative. As per HPI. Otherwise, a complete review of systems is negatve.  PAST MEDICAL HISTORY: Past Medical History  Diagnosis Date  . Cancer ([place]) left     breast cancer 2000  . Status post chemotherapy 2001    left breast cancer  . Status post radiation therapy 2001    left breast cancer  . Arthritis   . Lung nodule   . Diabetes mellitus without complication ([place])   . Breast cancer in female [facility]) 02/21/2016    PAST SURGICAL HISTORY: Past Surgical History  Procedure Laterality Date  . Mastectomy Left     2000  . Lung biopsy    . Breast biospy    . Rotator cuff repair      FAMILY HISTORY Family History  Problem Relation Age of Onset  . Breast cancer Mother 61  . Breast cancer Paternal Aunt 15    ADVANCED DIRECTIVES:  No flowsheet data found.  HEALTH MAINTENANCE: Social History  Substance Use Topics  . Smoking status: Former Research scientist (life sciences)  . Smokeless tobacco:  Never Used  . Alcohol Use: No      No Known Allergies  Current Outpatient Prescriptions  Medication Sig Dispense Refill  . albuterol (PROAIR HFA) 108 (90 BASE) MCG/ACT inhaler Inhale 1-2 puffs into the lungs every 6 (six) hours as needed for wheezing or shortness of breath.     Danielle [name] amLODipine (NORVASC) 5 MG tablet     . celecoxib (CELEBREX) 200 MG capsule Take 1 capsule (200 mg total) by mouth daily. 30 capsule 0  . citalopram (CELEXA) 20 MG tablet Take 20 mg by mouth daily.     Danielle [name] losartan-hydrochlorothiazide (HYZAAR) 100-25 MG  tablet Take 1 tablet by mouth daily. 30 tablet 0  . potassium chloride SA (K-DUR,KLOR-CON) 20 MEQ tablet Take 1 tablet (20 mEq total) by mouth daily. 30 tablet 6   No current facility-administered medications for this visit.  Significant History/PMH:   Depression:    lymphedema:    htn:    breast cancer:    Shoulder Surgery - Right:    Mastectomy:   Preventive Screening:  Has patient had any of the following test? Colonscopy  Mammography (1)   Last Colonoscopy: > 5 years(1)   Last Mammography: 01/2014(1)    OBJECTIVE:  Filed Vitals:   03/20/16 1425  BP: 127/86  Pulse: 96  Temp: 97.2 F (36.2 C)  Resp: 18     Body mass index is 23.51 kg/(m^2).    ECOG FS:1 - Symptomatic but completely ambulatory  PHYSICAL EXAM: General  status: Performance status is good.  Patient has not lost significant weight. Since last evaluation there is no significant change in the general status HEENT: No evidence of stomatitis. Sclera and conjunctivae :: No jaundice.   pale looking. Lungs: Emphysematous chest.  Diminished air entry on both sides.Coarse crepitation in the right upper lobe Lymphatic system: Cervical, axillary, inguinal, lymph nodes not palpable GI: Abdomen is soft.liver and spleen not palpable.  No ascites.  Bowel sounds are normal.  No other palpable masses.  No tenderness . Lower extremity: No edema.  Left upper extremity swelling. Examination of breasts: Left chest wall area no evidence of recurrent disease right breast free of masses Neurological system: Higher functions, cranial nerves intact no evidence of peripheral neuropathy. Skin: No rash.  No ecchymosis.. No petechial hemorrhages  Examination of right breast: Chest wall area no evidence of recurrent disease ,the left breast free of masses LAB RESULTS:  CBC Latest Ref Rng 02/21/2016 11/08/2015  WBC 3.6 - 11.0 K/uL 6.5 4.5  Hemoglobin 12.0 - 16.0 g/dL 12.3 12.5  Hematocrit 35.0 - 47.0 % 36.4 37.0  Platelets 150 -  440 K/uL 275 265    No visits with results within 5 Day(s) from this visit. Latest known visit with results is:  Appointment on 02/21/2016  Component Date Value Ref Range Status  . WBC 02/21/2016 6.5  3.6 - 11.0 K/uL Final  . RBC 02/21/2016 3.85  3.80 - 5.20 MIL/uL Final  . Hemoglobin 02/21/2016 12.3  12.0 - 16.0 g/dL Final  . HCT 02/21/2016 36.4  35.0 - 47.0 % Final  . MCV 02/21/2016 94.5  80.0 - 100.0 fL Final  . MCH 02/21/2016 31.9  26.0 - 34.0 pg Final  . MCHC 02/21/2016 33.7  32.0 - 36.0 g/dL Final  . RDW 02/21/2016 13.7  11.5 - 14.5 % Final  . Platelets 02/21/2016 275  150 - 440 K/uL Final  . Neutrophils Relative % 02/21/2016 62   Final  . Neutro Abs 02/21/2016 4.0  1.4 - 6.5 K/uL Final  . Lymphocytes Relative 02/21/2016 30   Final  . Lymphs Abs 02/21/2016 2.0  1.0 - 3.6 K/uL Final  . Monocytes Relative 02/21/2016 6   Final  . Monocytes Absolute 02/21/2016 0.4  0.2 - 0.9 K/uL Final  . Eosinophils Relative 02/21/2016 0   Final  . Eosinophils Absolute 02/21/2016 0.0  0 - 0.7 K/uL Final  . Basophils Relative 02/21/2016 2   Final  . Basophils Absolute 02/21/2016 0.1  0 - 0.1 K/uL Final  . Sodium 02/21/2016 135  135 - 145 mmol/L Final  . Potassium 02/21/2016 3.6  3.5 - 5.1 mmol/L Final  . Chloride 02/21/2016 97* 101 - 111 mmol/L Final  . CO2 02/21/2016 27  22 - 32 mmol/L Final  . Glucose, Bld 02/21/2016 110* 65 - 99 mg/dL Final  . BUN 02/21/2016 20  6 - 20 mg/dL Final  . Creatinine, Ser 02/21/2016 0.84  0.44 - 1.00 mg/dL Final  . Calcium 02/21/2016 9.4  8.9 - 10.3 mg/dL Final  . Total Protein 02/21/2016 8.6* 6.5 - 8.1 g/dL Final  . Albumin 02/21/2016 4.5  3.5 - 5.0 g/dL Final  . AST 02/21/2016 46* 15 - 41 U/L Final  . ALT 02/21/2016 32  14 - 54 U/L Final  . Alkaline Phosphatase 02/21/2016 80  38 - 126 U/L Final  . Total Bilirubin 02/21/2016 0.8  0.3 - 1.2 mg/dL Final  . GFR calc non Af Amer 02/21/2016 >60  >60 mL/min Final  . GFR calc Af Amer 02/21/2016 >60  >60 mL/min  Final   Comment: (NOTE) The eGFR has been calculated using the CKD EPI equation. This calculation has not been validated in all clinical situations. eGFR's persistently <60 mL/min signify possible Chronic Kidney Disease.   . Anion gap 02/21/2016 11  5 - 15 Final       S ASSESSMENT:  MEDICAL DECISION MAKING:  1.  Shortness of breath on exertion.    Previous history of right upper lobe lung cancer status post radiation therapy squamous cell carcinoma.    CT scan revealed possibility of right upper lobe infiltrate which very well could be radiation changes versus progressing malignancy.   We will refer this patient to pulmonologist for evaluation.  2.  Hypertension.    Well-controlled 4.  Carcinoma breast will get another mammogram done there is no clinical evidence of recurrent disease  She has a mammogram scheduled of the right breast (June of 2017)) 5.  Left upper extremity lymphedema CT scan of chest has been reviewed.    Right upper lobe mass radiation changes right upper lobe infiltrate and is to be followed up with either bronchoscopy or a PET scan.  Exact etiology of these pulmonary infiltrate is not clear.  It is new in onset. Duration of visit is 25 minutes and 50% of time was spent discussing VARIOUS  options or other physicians social worker Patient expressed understanding and was in agreement with this plan. She also understands that She can call clinic at any time with any questions, concerns, or complaints.    No matching staging information was found for the patient.  Forest Gleason, MD   03/20/2016 2:40 PM

## 2016-03-22 ENCOUNTER — Encounter: Payer: Self-pay | Admitting: Internal Medicine

## 2016-03-27 ENCOUNTER — Encounter (INDEPENDENT_AMBULATORY_CARE_PROVIDER_SITE_OTHER): Payer: Self-pay

## 2016-03-27 ENCOUNTER — Ambulatory Visit (INDEPENDENT_AMBULATORY_CARE_PROVIDER_SITE_OTHER): Payer: BLUE CROSS/BLUE SHIELD | Admitting: Internal Medicine

## 2016-03-27 ENCOUNTER — Encounter: Payer: Self-pay | Admitting: Internal Medicine

## 2016-03-27 VITALS — BP 142/80 | HR 86 | Ht 65.5 in | Wt 144.0 lb

## 2016-03-27 DIAGNOSIS — C3411 Malignant neoplasm of upper lobe, right bronchus or lung: Secondary | ICD-10-CM | POA: Diagnosis not present

## 2016-03-27 DIAGNOSIS — J449 Chronic obstructive pulmonary disease, unspecified: Secondary | ICD-10-CM | POA: Diagnosis not present

## 2016-03-27 NOTE — Assessment & Plan Note (Signed)
Patient with moderate COPD which is currently stable Patient educated on proper use and administration of Symbicort. Again, she is advised to use it 2 puffs in the morning and 2 puffs at night. She was also educated on the use of Spiriva and her rescue inhaler. Her O2 with exertion will be further worked up by Dr. Raul Del office. Overall patient with stable, moderate COPD not requiring any stepup therapy at this time. CAT scan reviewed, moderate emphysema with stable right upper lobe and left apical opacities.  Plan: -Symbicort 160/4. 5-2 puffs twice a day, gargle and rinse after each use Continue with Spiriva Continue rescue inhaler Continuous supplemental oxygen, 2 L, with exertion

## 2016-03-27 NOTE — Assessment & Plan Note (Addendum)
Enlarging right upper lobe and right middle lobe lesions, groundglass opacities present. There is a 4 mm left upper lobe nodule also. Patient with history of stage I A non-small cell lung cancer status post radiation and chemotherapy, also a history of breast cancer status post chemotherapy and radiation.  Given the findings and CT of chest, of enlarging right middle lobe groundglass opacities, I recommend further evaluation with bronchoscopy and BAL. Different modalities of bronchoscopy discussed with the patient, including navigational bronchoscopy and ultrasound-guided/right middle lobe lung lesions along with ultrasound-guided bronchoscopy with biopsy -Patient advised to avoid all forms of smoking including secondhand smoking, electronic cigarettes and vaping -Patient advised to avoid all forms of anticoagulation 5 days prior to procedure, these include BC powder, Goody powder, aspirin, oral echo quite elation drugs, antiplatelet drugs.

## 2016-03-27 NOTE — Progress Notes (Signed)
[facility] Consultation    Date: 03/27/2016  MRN# [id number] Danielle [name] [birth year]  Referring Physician: Dr. Jeb Levering Primary Pulmonologist - Dr. Erik Obey Danielle [name] is a 60 y.o. old female seen in consultation for COPD optimization  CC:  Chief Complaint  Patient presents with  . Follow-up    OV per Dr.Choksi. last CT 5/16. pt c/o occ sob w/bendingX1y dizziness mainly in the a.mX3wk non prod coughX1-2wk    HPI:  Patient is a pleasant 60 year old female past medical history of left breast cancer status post mastectomy/chemotherapy/radiation, lung cancer status post radiation, moderate to severe COPD, diabetes seen in consultation for COPD optimization. Off note, patient follows with Dr. Vella Kohler. She last saw Dr. Vella Kohler, on 01/04/2016 and also has spirometry at that time, noted to have moderate to severe obstruction. Patient initially presented to her oncologist back in January 2017, at that time it was noted she had COPD with shortness of breath. Since then she has seen her primary lung doctor for COPD optimization, she has been started on Symbicort and Spiriva. Since initiation of these medications she states that her breathing has actually improving stabilized, she does have a nonproductive daily cough, she does not use albuterol on a daily basis. Her primary pulmonologist has prescribed Symbicort 2 puffs twice a day, however she is using it 1 puff twice a day. She was hospitalized for COPD exacerbation in September 2014 after that point she was discharge on 2 L of oxygen with exertion, which she has been using intermittently since then. She quit smoking in 2014, previously had a 40-pack-year history. Overall she states that her breathing has been stable since she is on her inhalers, she has no new complaints, she had denies any fever, nausea, vomiting, significant weight loss.  Events since last clinic visit -Patient presents today for a follow-up per  hematology oncology request. Since her last visit she's had a CT scan of her chest without contrast performed that showed enlarging ground glass opacities in the right upper lobe and right middle lobe. She admits to chronic nighttime sweats over the past 2 years which are not getting worse. She is not currently smoking, she is status post radiation and chemotherapy for both lung cancer and breast cancer in the recent past. Images reviewed differential also includes radiation pneumonitis or infection or recurrence of malignancy. Today we discussed further evaluation with bronchoscopy for which she is in agreement with. She does have a chronic cough intermittent at times without any somatic and sputum production She denies any significant weight loss.  PMHX:   Past Medical History  Diagnosis Date  . Cancer ([place]) left     breast cancer 2000  . Status post chemotherapy 2001    left breast cancer  . Status post radiation therapy 2001    left breast cancer  . Arthritis   . Lung nodule   . Diabetes mellitus without complication ([place])   . Breast cancer in female [facility]le [name]) 02/21/2016   Surgical Hx:  Past Surgical History  Procedure Laterality Date  . Mastectomy Left     2000  . Lung biopsy    . Breast biospy    . Rotator cuff repair     Family Hx:  Family History  Problem Relation Age of Onset  . Breast cancer Mother 18  . Breast cancer Paternal Aunt 36   Social Hx:   Social History  Substance Use Topics  . Smoking status: Former Smoker -- 0.50 packs/day for 20  years  . Smokeless tobacco: Never Used     Comment: quit 2014  . Alcohol Use: Yes     Comment: occ   Medication:   Current Outpatient Rx  Name  Route  Sig  Dispense  Refill  . albuterol (PROAIR HFA) 108 (90 BASE) MCG/ACT inhaler   Inhalation   Inhale 1-2 puffs into the lungs every 6 (six) hours as needed for wheezing or shortness of breath.          [name] amLODipine (NORVASC) 5 MG tablet               . celecoxib  (CELEBREX) 200 MG capsule   Oral   Take 1 capsule (200 mg total) by mouth daily.   30 capsule   0   . citalopram (CELEXA) 20 MG tablet   Oral   Take 20 mg by mouth daily.          [name] losartan-hydrochlorothiazide (HYZAAR) 100-25 MG tablet   Oral   Take 1 tablet by mouth daily.   30 tablet   0   . potassium chloride SA (K-DUR,KLOR-CON) 20 MEQ tablet   Oral   Take 1 tablet (20 mEq total) by mouth daily.   30 tablet   6       Allergies:  Review of patient's allergies indicates no known allergies.  Review of Systems  Constitutional: Negative for fever and chills.  Eyes: Negative for blurred vision and double vision.  Respiratory: Positive for cough and shortness of breath. Negative for sputum production and wheezing.        Chronic, not worse since last visit  Cardiovascular: Negative for chest pain.  Gastrointestinal: Negative for heartburn and nausea.  Genitourinary: Negative for dysuria.  Musculoskeletal: Negative for myalgias.  Skin: Negative for rash.  Neurological: Negative for dizziness and headaches.  Endo/Heme/Allergies: Does not bruise/bleed easily.  Psychiatric/Behavioral: Negative for depression.     Physical Examination:   VS: BP 142/80 mmHg  Pulse 86  Ht 5' 5.5" (1.664 m)  Wt 144 lb (65.318 kg)  BMI 23.59 kg/m2  SpO2 94%  General Appearance: No distress  Neuro:without focal findings, mental status, speech normal, alert and oriented, cranial nerves 2-12 intact, reflexes normal and symmetric, sensation grossly normal  HEENT: PERRLA, EOM intact, no ptosis, no other lesions noticed; Mallampati 3 Pulmonary: normal breath sounds., diaphragmatic excursion normal.No wheezing, No rales;   Sputum Production:   CardiovascularNormal S1,S2.  No m/r/g.  Abdominal aorta pulsation normal.    Abdomen: Benign, Soft, non-tender, No masses, hepatosplenomegaly, No lymphadenopathy Renal:  No costovertebral tenderness  GU:  No performed at this time. Endoc: No evident  thyromegaly, no signs of acromegaly or Cushing features Skin:   warm, no rashes, no ecchymosis  Extremities: normal, no cyanosis, clubbing, no edema, warm with normal capillary refill. Other findings:none   Labs results:   Rad results: (The following images and results were reviewed by Dr. Stevenson Clinch on 03/27/2016). CT Chest 10/2015 IMPRESSION:  1. Negative for acute PE or thoracic aortic dissection. 2. Atherosclerosis, including aortic and coronary artery disease. Please note that although the presence of coronary artery calcium documents the presence of coronary artery disease, the severity of this disease and any potential stenosis cannot be assessed on this non-gated CT examination. Assessment for potential risk factor modification, dietary therapy or pharmacologic therapy may be warranted, if clinically indicated. 3. Emphysema with stable right upper lobe and left apical opacities as before.   Spirometry from Dr. Gust Brooms notes SPIROMETRY:  FVC was 2.09 liters, 82% of predicted FEV1 was 1.03, 50% of predicted FEV1 ratio was 49 FEF 25-75% liters per second was 10% of predicted  LUNG VOLUMES: TLC was 76% of predicted RV was 91% of predicted  DIFFUSION CAPACITY: DLCO was 40% of predicted DLCO/VA was 64% of predicted  FLOW VOLUME LOOP: Expiratory flow volume loop isdelayed  Impression Severe obstruction on pfts TLC is slightly delayed DLCO is severely decreased but corrects for VA  *Compared to Previous Study, numbers are slightly declined.  CT chest May 2017 -IMPRESSION: 1. Enlarging irregular sub solid right lung lesion involving the middle and upper lobes with increasing solid components worrisome for atypical malignancy (morphologically suspicious for primary lung adenocarcinoma). Tissue sampling recommended. Follow up PET/CT should be considered for staging purposes. These recommendations are taken from: Recommendations for the Management of Subsolid  Pulmonary Nodules Detected at CT: A Statement from the [facility] 2013; 266:1, 304-317. 2. Stable treated right upper lobe lesion. 3. No suspicious left lung findings. 4. No evidence of metastatic breast cancer. 5. Diffuse emphysema, scattered pulmonary scarring and diffuse atherosclerosis again noted.    Assessment and Plan: 60 year old female seen in consultation for COPD optimization Moderate COPD (chronic obstructive pulmonary disease) ([place]) Patient with moderate COPD which is currently stable Patient educated on proper use and administration of Symbicort. Again, she is advised to use it 2 puffs in the morning and 2 puffs at night. She was also educated on the use of Spiriva and her rescue inhaler. Her O2 with exertion will be further worked up by Dr. Raul Del office. Overall patient with stable, moderate COPD not requiring any stepup therapy at this time. CAT scan reviewed, moderate emphysema with stable right upper lobe and left apical opacities.  Plan: -Symbicort 160/4. 5-2 puffs twice a day, gargle and rinse after each use Continue with Spiriva Continue rescue inhaler Continuous supplemental oxygen, 2 L, with exertion    Lung cancer, upper lobe (HCC) Enlarging right upper lobe and right middle lobe lesions, groundglass opacities present. There is a 4 mm left upper lobe nodule also. Patient with history of stage I A non-small cell lung cancer status post radiation and chemotherapy, also a history of breast cancer status post chemotherapy and radiation.  Given the findings and CT of chest, of enlarging right middle lobe groundglass opacities, I recommend further evaluation with bronchoscopy and BAL. Different modalities of bronchoscopy discussed with the patient, including navigational bronchoscopy and ultrasound-guided/right middle lobe lung lesions along with ultrasound-guided bronchoscopy with biopsy -Patient advised to avoid all forms of smoking including  secondhand smoking, electronic cigarettes and vaping -Patient advised to avoid all forms of anticoagulation 5 days prior to procedure, these include BC powder, Goody powder, aspirin, oral echo quite elation drugs, antiplatelet drugs.    Updated Medication List Outpatient Encounter Prescriptions as of 03/27/2016  Medication Sig  . albuterol (PROAIR HFA) 108 (90 BASE) MCG/ACT inhaler Inhale 1-2 puffs into the lungs every 6 (six) hours as needed for wheezing or shortness of breath.   [name] amLODipine (NORVASC) 5 MG tablet   . celecoxib (CELEBREX) 200 MG capsule Take 1 capsule (200 mg total) by mouth daily.  . citalopram (CELEXA) 20 MG tablet Take 20 mg by mouth daily.   [name] losartan-hydrochlorothiazide (HYZAAR) 100-25 MG tablet Take 1 tablet by mouth daily.  . potassium chloride SA (K-DUR,KLOR-CON) 20 MEQ tablet Take 1 tablet (20 mEq total) by mouth daily.   No facility-administered encounter medications on file as of 03/27/2016.  Orders for this visit: No orders of the defined types were placed in this encounter.     Thank  you for the consultation and for allowing [facility] to assist in the care of your patient. Our recommendations are noted above.  Please contact us if we can be of further service.   Vilinda Boehringer, MD [facility] Office Number: [phone]  Note: This note was prepared with Dragon dictation along with smaller phrase technology. Any transcriptional errors that result from this process are unintentional.

## 2016-03-27 NOTE — Patient Instructions (Signed)
Follow up with Dr. Stevenson Clinch in:2 months - we will plan for a bronchoscopy with biopsy (EBUS and ENB) for your right middle lobe lung lesion.  - please avoid all forms of smoking

## 2016-03-29 ENCOUNTER — Other Ambulatory Visit: Payer: Self-pay | Admitting: Family Medicine

## 2016-03-29 ENCOUNTER — Ambulatory Visit
Admission: RE | Admit: 2016-03-29 | Discharge: 2016-03-29 | Disposition: A | Discharge status: Home / Self Care | Payer: BLUE CROSS/BLUE SHIELD | Source: Ambulatory Visit | Attending: Family Medicine | Admitting: Family Medicine

## 2016-03-29 DIAGNOSIS — Z1231 Encounter for screening mammogram for malignant neoplasm of breast: Secondary | ICD-10-CM | POA: Diagnosis present

## 2016-03-29 DIAGNOSIS — C50911 Malignant neoplasm of unspecified site of right female breast: Secondary | ICD-10-CM

## 2016-04-03 ENCOUNTER — Encounter
Admission: RE | Admit: 2016-04-03 | Discharge: 2016-04-03 | Disposition: A | Discharge status: Home / Self Care | Payer: BLUE CROSS/BLUE SHIELD | Source: Ambulatory Visit | Attending: Internal Medicine | Admitting: Internal Medicine

## 2016-04-03 ENCOUNTER — Telehealth: Payer: Self-pay | Admitting: Internal Medicine

## 2016-04-03 DIAGNOSIS — Z029 Encounter for administrative examinations, unspecified: Secondary | ICD-10-CM | POA: Insufficient documentation

## 2016-04-03 DIAGNOSIS — R0602 Shortness of breath: Secondary | ICD-10-CM

## 2016-04-03 HISTORY — DX: Chronic obstructive pulmonary disease, unspecified: J44.9

## 2016-04-03 HISTORY — DX: Anxiety disorder, unspecified: F41.9

## 2016-04-03 HISTORY — DX: Essential (primary) hypertension: I10

## 2016-04-03 HISTORY — DX: Cardiac murmur, unspecified: R01.1

## 2016-04-03 HISTORY — DX: Reserved for inherently not codable concepts without codable children: IMO0001

## 2016-04-03 LAB — CBC
HCT: 36.8 % (ref 35.0–47.0)
HEMOGLOBIN: 12.3 g/dL (ref 12.0–16.0)
MCH: 32 pg (ref 26.0–34.0)
MCHC: 33.5 g/dL (ref 32.0–36.0)
MCV: 95.5 fL (ref 80.0–100.0)
PLATELETS: 293 10*3/uL (ref 150–440)
RBC: 3.85 MIL/uL (ref 3.80–5.20)
RDW: 13.4 % (ref 11.5–14.5)
WBC: 5.2 10*3/uL (ref 3.6–11.0)

## 2016-04-03 LAB — BASIC METABOLIC PANEL
ANION GAP: 8 (ref 5–15)
BUN: 15 mg/dL (ref 6–20)
CO2: 28 mmol/L (ref 22–32)
Calcium: 10.3 mg/dL (ref 8.9–10.3)
Chloride: 100 mmol/L — ABNORMAL LOW (ref 101–111)
Creatinine, Ser: 0.85 mg/dL (ref 0.44–1.00)
Glucose, Bld: 88 mg/dL (ref 65–99)
POTASSIUM: 4.4 mmol/L (ref 3.5–5.1)
SODIUM: 136 mmol/L (ref 135–145)

## 2016-04-03 LAB — PROTIME-INR
INR: 1.1
PROTHROMBIN TIME: 14.4 s (ref 11.4–15.0)

## 2016-04-03 NOTE — Patient Instructions (Signed)
  Your procedure is scheduled on: April 11, 2016 (Wednesday) Report to Day Surgery. SECOND FLOOR  MEDICAL [facility]ALL To find out your arrival time please call [phone] between 1PM - 3PM on April 10, 2016 (Tuesday).  Remember: Instructions that are not followed completely may result in serious medical risk, up to and including death, or upon the discretion of your surgeon and anesthesiologist your surgery may need to be rescheduled.    __x__ 1. Do not eat food or drink liquids after midnight. No gum chewing or hard candies.     __x__ 2. No Alcohol for 24 hours before or after surgery.   __x__ 3. Do Not Smoke For 24 Hours Prior to Your Surgery.   ____ 4. Bring all medications with you on the day of surgery if instructed.    __x__ 5. Notify your doctor if there is any change in your medical condition     (cold, fever, infections).       Do not wear jewelry, make-up, hairpins, clips or nail polish.  Do not wear lotions, powders, or perfumes. You may wear deodorant.  Do not shave 48 hours prior to surgery. Men may shave face and neck.  Do not bring valuables to the hospital.    [facility] is not responsible for any belongings or valuables.               Contacts, dentures or bridgework may not be worn into surgery.  Leave your suitcase in the car. After surgery it may be brought to your room.  For patients admitted to the hospital, discharge time is determined by your                treatment team.   Patients discharged the day of surgery will not be allowed to drive home.   Please read over the following fact sheets that you were given:   Surgical Site Infection Prevention   __x__ Take these medicines the morning of surgery with A SIP OF WATER:    1. AMLODIPINE  2.   3.   4.  5.  6.  ____ Fleet Enema (as directed)   __x__ Use CHG Soap as directed  __x__ Use inhalers on the day of surgery (USE ALBUTEROL INHALER THE MORNING OF SURGERY AND BRING TO HOSPITAL)  ____ Stop  metformin 2 days prior to surgery    ____ Take 1/2 of usual insulin dose the night before surgery and none on the morning of surgery.   __x__ Stop Coumadin/Plavix/aspirin on ( DO NOT TAKE ASPIRIN OR ANY ASPIRIN PRODUCTS)  __x__ Stop Anti-inflammatories on (NO NSAIDS)   ____ Stop supplements until after surgery.    ____ Bring C-Pap to the hospital.

## 2016-04-03 NOTE — Telephone Encounter (Signed)
Orders placed. Baker Janus with pre-op is aware Nothing further needed

## 2016-04-03 NOTE — Telephone Encounter (Signed)
Preop labs: BMP, CBC, PT/INR, EKG  Thank you VM

## 2016-04-04 ENCOUNTER — Encounter: Payer: Self-pay | Admitting: *Deleted

## 2016-04-04 NOTE — Pre-Procedure Instructions (Signed)
DISCUSSED EKG WITH DR Kayleen Memos AND OK

## 2016-04-04 NOTE — Pre-Procedure Instructions (Signed)
EKG sent to Anesthesia for review. 

## 2016-04-11 ENCOUNTER — Encounter
Admission: RE | Disposition: A | Discharge status: Home / Self Care | Payer: Self-pay | Source: Ambulatory Visit | Attending: Internal Medicine

## 2016-04-11 ENCOUNTER — Ambulatory Visit: Payer: BLUE CROSS/BLUE SHIELD | Admitting: Anesthesiology

## 2016-04-11 ENCOUNTER — Encounter: Payer: Self-pay | Admitting: Anesthesiology

## 2016-04-11 ENCOUNTER — Ambulatory Visit: Payer: BLUE CROSS/BLUE SHIELD

## 2016-04-11 ENCOUNTER — Ambulatory Visit
Admission: RE | Admit: 2016-04-11 | Discharge: 2016-04-11 | Disposition: A | Discharge status: Home / Self Care | Payer: BLUE CROSS/BLUE SHIELD | Source: Ambulatory Visit | Attending: Internal Medicine | Admitting: Internal Medicine

## 2016-04-11 DIAGNOSIS — I10 Essential (primary) hypertension: Secondary | ICD-10-CM | POA: Insufficient documentation

## 2016-04-11 DIAGNOSIS — J449 Chronic obstructive pulmonary disease, unspecified: Secondary | ICD-10-CM | POA: Insufficient documentation

## 2016-04-11 DIAGNOSIS — F419 Anxiety disorder, unspecified: Secondary | ICD-10-CM | POA: Insufficient documentation

## 2016-04-11 DIAGNOSIS — R911 Solitary pulmonary nodule: Secondary | ICD-10-CM | POA: Insufficient documentation

## 2016-04-11 DIAGNOSIS — M1990 Unspecified osteoarthritis, unspecified site: Secondary | ICD-10-CM | POA: Diagnosis not present

## 2016-04-11 DIAGNOSIS — Z87891 Personal history of nicotine dependence: Secondary | ICD-10-CM | POA: Insufficient documentation

## 2016-04-11 DIAGNOSIS — I493 Ventricular premature depolarization: Secondary | ICD-10-CM | POA: Diagnosis not present

## 2016-04-11 DIAGNOSIS — R059 Cough, unspecified: Secondary | ICD-10-CM | POA: Insufficient documentation

## 2016-04-11 DIAGNOSIS — C342 Malignant neoplasm of middle lobe, bronchus or lung: Secondary | ICD-10-CM | POA: Insufficient documentation

## 2016-04-11 DIAGNOSIS — E119 Type 2 diabetes mellitus without complications: Secondary | ICD-10-CM | POA: Diagnosis not present

## 2016-04-11 DIAGNOSIS — J984 Other disorders of lung: Secondary | ICD-10-CM | POA: Diagnosis not present

## 2016-04-11 DIAGNOSIS — R0602 Shortness of breath: Secondary | ICD-10-CM | POA: Diagnosis not present

## 2016-04-11 DIAGNOSIS — R05 Cough: Secondary | ICD-10-CM | POA: Diagnosis not present

## 2016-04-11 DIAGNOSIS — Z9889 Other specified postprocedural states: Secondary | ICD-10-CM

## 2016-04-11 HISTORY — PX: ELECTROMAGNETIC NAVIGATION BROCHOSCOPY: SHX5369

## 2016-04-11 SURGERY — ELECTROMAGNETIC NAVIGATION BRONCHOSCOPY
Anesthesia: General | Laterality: Right

## 2016-04-11 MED ORDER — SUGAMMADEX SODIUM 200 MG/2ML IV SOLN
INTRAVENOUS | Status: DC | PRN
  Administered 2016-04-11: 130 mg via INTRAVENOUS

## 2016-04-11 MED ORDER — ONDANSETRON HCL 4 MG/2ML IJ SOLN: 4.0000 mg | Freq: Once | INTRAMUSCULAR | Status: DC | PRN

## 2016-04-11 MED ORDER — PROPOFOL 10 MG/ML IV BOLUS
INTRAVENOUS | Status: DC | PRN
  Administered 2016-04-11: 30 mg via INTRAVENOUS
  Administered 2016-04-11: 170 mg via INTRAVENOUS

## 2016-04-11 MED ORDER — FENTANYL CITRATE (PF) 100 MCG/2ML IJ SOLN
INTRAMUSCULAR | Status: DC | PRN
  Administered 2016-04-11: 50 ug via INTRAVENOUS
  Administered 2016-04-11: 100 ug via INTRAVENOUS

## 2016-04-11 MED ORDER — SUCCINYLCHOLINE CHLORIDE 20 MG/ML IJ SOLN
INTRAMUSCULAR | Status: DC | PRN
  Administered 2016-04-11: 100 mg via INTRAVENOUS

## 2016-04-11 MED ORDER — FAMOTIDINE 20 MG PO TABS
20.0000 mg | ORAL_TABLET | Freq: Once | ORAL | Status: AC
  Administered 2016-04-11: 20 mg via ORAL

## 2016-04-11 MED ORDER — MIDAZOLAM HCL 5 MG/5ML IJ SOLN
INTRAMUSCULAR | Status: DC | PRN
  Administered 2016-04-11: 2 mg via INTRAVENOUS

## 2016-04-11 MED ORDER — ONDANSETRON HCL 4 MG/2ML IJ SOLN
INTRAMUSCULAR | Status: DC | PRN
  Administered 2016-04-11: 4 mg via INTRAVENOUS

## 2016-04-11 MED ORDER — FAMOTIDINE 20 MG PO TABS
ORAL_TABLET | ORAL | Status: AC
  Administered 2016-04-11: 20 mg via ORAL
  Filled 2016-04-11: qty 1

## 2016-04-11 MED ORDER — SODIUM CHLORIDE 0.9 % IV SOLN
INTRAVENOUS | Status: DC
  Administered 2016-04-11: 12:00:00 via INTRAVENOUS

## 2016-04-11 MED ORDER — LIDOCAINE HCL (CARDIAC) 20 MG/ML IV SOLN
INTRAVENOUS | Status: DC | PRN
  Administered 2016-04-11: 60 mg via INTRAVENOUS

## 2016-04-11 MED ORDER — FENTANYL CITRATE (PF) 100 MCG/2ML IJ SOLN: 25.0000 ug | INTRAMUSCULAR | Status: DC | PRN

## 2016-04-11 MED ORDER — ROCURONIUM BROMIDE 100 MG/10ML IV SOLN
INTRAVENOUS | Status: DC | PRN
  Administered 2016-04-11: 15 mg via INTRAVENOUS
  Administered 2016-04-11: 20 mg via INTRAVENOUS
  Administered 2016-04-11: 5 mg via INTRAVENOUS
  Administered 2016-04-11: 10 mg via INTRAVENOUS

## 2016-04-11 MED ORDER — DEXAMETHASONE SODIUM PHOSPHATE 10 MG/ML IJ SOLN
INTRAMUSCULAR | Status: DC | PRN
  Administered 2016-04-11: 5 mg via INTRAVENOUS

## 2016-04-11 MED ORDER — PHENYLEPHRINE HCL 10 MG/ML IJ SOLN
INTRAMUSCULAR | Status: DC | PRN
  Administered 2016-04-11 (×5): 100 ug via INTRAVENOUS

## 2016-04-11 NOTE — Discharge Instructions (Signed)

## 2016-04-11 NOTE — Transfer of Care (Signed)
Immediate Anesthesia Transfer of Care Note  Patient: Danielle [name]  Procedure(s) Performed: Procedure(s): ELECTROMAGNETIC NAVIGATION BRONCHOSCOPY (Right)  Patient Location: PACU  Anesthesia Type:General  Level of Consciousness: awake, alert , oriented and patient cooperative  Airway & Oxygen Therapy: Patient Spontanous Breathing and Patient connected to face mask oxygen  Post-op Assessment: Report given to RN  Post vital signs: Reviewed and stable  Last Vitals:  Filed Vitals:   04/11/16 1159 04/11/16 1546  BP: 156/91 167/87  Pulse: 78 85  Temp: 36.7 C 36.3 C  Resp: 16 20    Last Pain:  Filed Vitals:   04/11/16 1547  PainSc: 3          Complications: No apparent anesthesia complications

## 2016-04-11 NOTE — Procedures (Signed)
Date: 04/11/2016, '@TIME'$     PHYSICIAN:  Siara Gorder  Indications/Preliminary Diagnosis: Hx of Stage 1a Sq Lung ca now with new RML lesions  Consent: (Place X beside choice/s below)  The benefits, risks and possible complications of the procedure were        explained to:  _x__ patient  ___ patient's family  ___ other:___________  who verbalized understanding and gave:  ___ verbal  ___ written  _x__ verbal and written  ___ telephone  ___ other:________ consent.      Unable to obtain consent; procedure performed on emergent basis.     Other:       PRESEDATION ASSESSMENT: History and Physical has been performed. Patient meds and allergies have been reviewed. Presedation airway examination has been performed and documented. Baseline vital signs, sedation score, oxygenation status, and cardiac rhythm were reviewed. Patient was deemed to be in satisfactory condition to undergo the procedure.  PREMEDICATIONS:   Sedative/Narcotic Amt Dose   Versed  mg   Fentanyl  mcg  Diprivan  mg        Airway Prep (Place X beside choice below)   1% Transtracheal Lidocaine Anesthetization 7 cc   Patient prepped per Bronchoscopy Lab Policy       Insertion Route (Place X beside choice below)   Nasal   Oral  x Endotracheal Tube   Tracheostomy   INTRAPROCEDURE MEDICATIONS:  Sedative/Narcotic Amt Dose   Versed  mg   Fentanyl  mcg  Diprivan  mg       Medication Amt Dose  Medication Amt Dose  Xylocaine 2%  cc  Epinephrine 1:10,000 sol  cc  Xylocaine 4%  cc  Cocaine  cc   TECHNICAL PROCEDURES: (Place X beside choice below)   Procedures  Description    None     Electrocautery     Cryotherapy     Balloon Dilatation     Bronchography     Stent Placement   x  Therapeutic Aspiration     Laser/Argon Plasma    Brachytherapy Catheter Placement    Foreign Body Removal     SPECIMENS (Sites): (Place X beside choice below)  Specimens Description   No Specimens Obtained     Washings   xx  Lavage    Biopsies    Fine Needle Aspirates   x Brushings    Sputum    FINDINGS: see below  ESTIMATED BLOOD LOSS: <86VE  COMPLICATIONS/RESOLUTION:   PROCEDURE DETAILS: Timeout performed and correct patient, name, & ID confirmed. Following prep per Pulmonary policy, appropriate sedation was administered.  Airway exam proceeded with findings, technical procedures, and specimen collection as noted below. At the end of exam the scope was withdrawn without incident. Impression and Plan as noted below.   Inspection: Right - RMS and RBI mucosal inflammation, orifice and mucosa to RUL with inflammatory changes, possibly related to fibrosis, "fish" gill slit appearance noted in the RBI and RUL mucosa. No endobronchial lesions. Left - LMS with moderate mucosal inflammation, no secretions, no endobronchial lesions.   Procedure: After inspection, attention was turned to the navigational portion of the procedure. The patient was already set on the ENB board and pre-procedure planning had been performed using an appropriate chest CT imaging set. Good concordance was noted between software, imaging and patient. Navigation was undertaken to the lesion in the RML without difficulty.   FNA (triple needle brush, GenCut, 21G x 2) , Forceps, brushings  BAL of RML for micro and cytology  IMPLANTED  DEVICE(S):none  IMPRESSION:POST-PROCEDURE XB:JYNW   RECOMMENDATION/PLAN: follow up results in clinic    ADDITIONAL COMMENTS:none   Procedure Time:1.5 hrs   Danielle Boehringer, MD [facility] Pager : [phone] (Please enter 7 digits) On call pager [phone] (please enter 7-digits) Clinic - [phone]

## 2016-04-11 NOTE — H&P (Signed)
See full H&P fro clinic visit on 03/27/16. No change in physical exam.  Briefly - Enlarging irregular sub solid right lung lesion involving the middle and upper lobes with increasing solid components worrisome for atypical malignancy.  Plan  ENB and biopsy  Vilinda Boehringer, MD [facility] Pager [phone] (please enter 7-digits) On Call Pager - [phone] (please enter 7-digits)

## 2016-04-11 NOTE — Anesthesia Postprocedure Evaluation (Signed)
Anesthesia Post Note  Patient: Danielle [name]  Procedure(s) Performed: Procedure(s) (LRB): ELECTROMAGNETIC NAVIGATION BRONCHOSCOPY (Right)  Patient location during evaluation: PACU Anesthesia Type: General Level of consciousness: awake Pain management: pain level controlled Vital Signs Assessment: post-procedure vital signs reviewed and stable Respiratory status: spontaneous breathing Cardiovascular status: stable Anesthetic complications: no    Last Vitals:  Filed Vitals:   04/11/16 1159 04/11/16 1546  BP: 156/91 167/87  Pulse: 78 85  Temp: 36.7 C 36.3 C  Resp: 16 20    Last Pain:  Filed Vitals:   04/11/16 1547  PainSc: 3                  VAN STAVEREN,Dyllen Menning

## 2016-04-11 NOTE — Anesthesia Preprocedure Evaluation (Addendum)
Anesthesia Evaluation  Patient identified by MRN, date of birth, ID band Patient awake    Reviewed: Allergy & Precautions, NPO status , Patient's Chart, lab work & pertinent test results, reviewed documented beta blocker date and time   Airway Mallampati: II  TM Distance: >3 FB     Dental  (+) Chipped, Dental Advisory Given, Poor Dentition, Missing   Pulmonary shortness of breath, COPD,  COPD inhaler, former smoker,           Cardiovascular hypertension, Pt. on medications      Neuro/Psych Anxiety    GI/Hepatic   Endo/Other  diabetes, Type 2  Renal/GU      Musculoskeletal  (+) Arthritis ,   Abdominal   Peds  Hematology   Anesthesia Other Findings Lung Ca. 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  Reproductive/Obstetrics                           Anesthesia Physical Anesthesia Plan  ASA: III  Anesthesia Plan: General   Post-op Pain Management:    Induction: Intravenous  Airway Management Planned: Oral ETT  Additional Equipment:   Intra-op Plan:   Post-operative Plan:   Informed Consent: I have reviewed the patients History and Physical, chart, labs and discussed the procedure including the risks, benefits and alternatives for the proposed anesthesia with the patient or authorized representative who has indicated his/her understanding and acceptance.     Plan Discussed with: CRNA  Anesthesia Plan Comments:         Anesthesia Quick Evaluation

## 2016-04-11 NOTE — Anesthesia Procedure Notes (Signed)
Procedure Name: Intubation Date/Time: 04/11/2016 1:19 PM Performed by: Dionne Bucy Pre-anesthesia Checklist: Patient identified, Patient being monitored, Timeout performed, Emergency Drugs available and Suction available Patient Re-evaluated:Patient Re-evaluated prior to inductionOxygen Delivery Method: Circle system utilized Preoxygenation: Pre-oxygenation with 100% oxygen Intubation Type: IV induction Ventilation: Mask ventilation without difficulty Laryngoscope Size: Mac and 3 Grade View: Grade I Tube type: Oral Tube size: 8.5 mm Number of attempts: 1 Airway Equipment and Method: Stylet Placement Confirmation: ETT inserted through vocal cords under direct vision,  positive ETCO2 and breath sounds checked- equal and bilateral Secured at: 21 cm Tube secured with: Tape Dental Injury: Teeth and Oropharynx as per pre-operative assessment

## 2016-04-12 ENCOUNTER — Encounter: Payer: Self-pay | Admitting: Internal Medicine

## 2016-04-14 LAB — CULTURE, BAL-QUANTITATIVE
CULTURE: NO GROWTH
SPECIAL REQUESTS: NORMAL

## 2016-04-14 LAB — CULTURE, BAL-QUANTITATIVE W GRAM STAIN

## 2016-04-16 LAB — CYTOLOGY - NON PAP

## 2016-04-16 LAB — SURGICAL PATHOLOGY

## 2016-05-10 ENCOUNTER — Emergency Department: Payer: BLUE CROSS/BLUE SHIELD

## 2016-05-10 ENCOUNTER — Emergency Department
Admission: EM | Admit: 2016-05-10 | Discharge: 2016-05-11 | Disposition: A | Discharge status: Home / Self Care | Payer: BLUE CROSS/BLUE SHIELD | Attending: Student | Admitting: Student

## 2016-05-10 DIAGNOSIS — Z79899 Other long term (current) drug therapy: Secondary | ICD-10-CM | POA: Insufficient documentation

## 2016-05-10 DIAGNOSIS — Z853 Personal history of malignant neoplasm of breast: Secondary | ICD-10-CM | POA: Diagnosis not present

## 2016-05-10 DIAGNOSIS — E119 Type 2 diabetes mellitus without complications: Secondary | ICD-10-CM | POA: Diagnosis not present

## 2016-05-10 DIAGNOSIS — Z85118 Personal history of other malignant neoplasm of bronchus and lung: Secondary | ICD-10-CM | POA: Insufficient documentation

## 2016-05-10 DIAGNOSIS — I10 Essential (primary) hypertension: Secondary | ICD-10-CM | POA: Insufficient documentation

## 2016-05-10 DIAGNOSIS — J449 Chronic obstructive pulmonary disease, unspecified: Secondary | ICD-10-CM | POA: Insufficient documentation

## 2016-05-10 DIAGNOSIS — Z87891 Personal history of nicotine dependence: Secondary | ICD-10-CM | POA: Insufficient documentation

## 2016-05-10 DIAGNOSIS — M79604 Pain in right leg: Secondary | ICD-10-CM | POA: Insufficient documentation

## 2016-05-10 MED ORDER — AMLODIPINE BESYLATE 5 MG PO TABS: 5.0000 mg | ORAL_TABLET | Freq: Once | ORAL | Status: DC

## 2016-05-10 NOTE — ED Notes (Signed)
PT in ultrasound

## 2016-05-10 NOTE — ED Triage Notes (Signed)
Pt c/o pain and swelling to the right lower leg for the past 3 weeks and the nurse and her work thinks it may be a DVT

## 2016-05-10 NOTE — ED Provider Notes (Addendum)
[facility] Emergency Department Provider Note   ____________________________________________   First MD Initiated Contact with Patient 05/10/16 [id number]     (approximate)  I have reviewed the triage vital signs and the nursing notes.   HISTORY  Chief Complaint Leg Pain    HPI Danielle [name] is a 60 y.o. female with history of COPD, history of treated breast cancer,  hypertension who presents for evaluation of 3 weeks of atraumatic aching right leg pain, worse after a long day of standing on her feet, intermittent, currently mild. She reports that it is especially bad after she has worked 10-12 hours standing on her feet. One of the nurses where she works told her that it might be a blood clot and referred her to the emergency department. No chest pain or difficulty breathing. No vomiting, diarrhea, felt fevers or chills. No numbness or weakness in the right leg. No back pain.   Past Medical History:  Diagnosis Date  . Anxiety   . Arthritis   . Breast cancer in female [facility]) 02/21/2016  . Cancer ([place]) left    breast cancer 2000  . COPD (chronic obstructive pulmonary disease) ([place])   . Diabetes mellitus without complication ([place])   . Heart murmur   . Hypertension   . Lung nodule   . Shortness of breath dyspnea    with exertion  . Status post chemotherapy 2001   left breast cancer  . Status post radiation therapy 2001   left breast cancer    Patient Active Problem List   Diagnosis Date Noted  . Cough   . Lesion of right lung   . Breast cancer in female [facility]) 02/21/2016  . Lung cancer, upper lobe ([place]) 02/21/2016  . DOE (dyspnea on exertion) 02/14/2016  . Moderate COPD (chronic obstructive pulmonary disease) ([place]) 08/24/2014    Past Surgical History:  Procedure Laterality Date  . Breast Biospy Left    [facility]  . BREAST SURGERY    . DILATION AND CURETTAGE OF UTERUS    . ELECTROMAGNETIC NAVIGATION BROCHOSCOPY Right 04/11/2016   Procedure:  ELECTROMAGNETIC NAVIGATION BRONCHOSCOPY;  Surgeon: Vilinda Boehringer, MD;  Location: [facility] ORS;  Service: Cardiopulmonary;  Laterality: Right;  . LUNG BIOPSY    . MASTECTOMY Left    2000, [facility]  . ROTATOR CUFF REPAIR Right    [facility]    Prior to Admission medications   Medication Sig Start Date End Date Taking? Authorizing Provider  albuterol (PROAIR HFA) 108 (90 BASE) MCG/ACT inhaler Inhale 1-2 puffs into the lungs every 6 (six) hours as needed for wheezing or shortness of breath.  06/28/15   Historical Provider, MD  amLODipine (NORVASC) 5 MG tablet Take 5 mg by mouth daily.  03/19/16   Historical Provider, MD  celecoxib (CELEBREX) 200 MG capsule Take 1 capsule (200 mg total) by mouth daily. 07/23/15   Jenise V Bacon Menshew, PA-C  citalopram (CELEXA) 20 MG tablet Take 20 mg by mouth daily.     Historical Provider, MD  losartan-hydrochlorothiazide (HYZAAR) 100-25 MG tablet Take 1 tablet by mouth daily. 09/28/15   Forest Gleason, MD  potassium chloride SA (K-DUR,KLOR-CON) 20 MEQ tablet Take 1 tablet (20 mEq total) by mouth daily. 11/14/15   Forest Gleason, MD    Allergies Review of patient's allergies indicates no known allergies.  Family History  Problem Relation Age of Onset  . Breast cancer Mother 40  . Breast cancer Paternal Aunt 26    Social History Social History  Substance Use Topics  . Smoking status: Former Smoker    Packs/day: 0.50    Years: 20.00    Types: Cigarettes    Quit date: 07/02/2012  . Smokeless tobacco: Never Used     Comment: quit 2014  . Alcohol use Yes     Comment: occ    Review of Systems Constitutional: No fever/chills Eyes: No visual changes. ENT: No sore throat. Cardiovascular: Denies chest pain. Respiratory: Denies shortness of breath. Gastrointestinal: No abdominal pain.  No nausea, no vomiting.  No diarrhea.  No constipation. Genitourinary: Negative for dysuria. Musculoskeletal: Negative for back pain. Skin: Negative for rash. Neurological: Negative for  headaches, focal weakness or numbness.  10-point ROS otherwise negative.  ____________________________________________   PHYSICAL EXAM:  Vitals:   05/10/16 0740 05/10/16 0806 05/10/16 0833 05/10/16 0920  BP: (!) 182/100 (!) 170/102 (!) 151/98 (!) 159/97  Pulse:   94 64  Resp:   16 18  Temp:      TempSrc:      SpO2:   98% 97%  Weight:      Height:        VITAL SIGNS: ED Triage Vitals  Enc Vitals Group     BP 05/10/16 0730 (!) 193/104     Pulse Rate 05/10/16 0730 84     Resp 05/10/16 0730 16     Temp 05/10/16 0730 98.1 F (36.7 C)     Temp Source 05/10/16 0730 Oral     SpO2 05/10/16 0730 97 %     Weight 05/10/16 0730 143 lb (64.9 kg)     Height 05/10/16 0730 '5\' 6"'$  (1.676 m)     Head Circumference --      Peak Flow --      Pain Score 05/10/16 0729 6     Pain Loc --      Pain Edu? --      Excl. in [place]? --     Constitutional: Alert and oriented. Well appearing and in no acute distress. Eyes: Conjunctivae are normal. PERRL. EOMI. Head: Atraumatic. Nose: No congestion/rhinnorhea. Mouth/Throat: Mucous membranes are moist.  Oropharynx non-erythematous. Neck: No stridor.   Cardiovascular: Normal rate, regular rhythm. Grossly normal heart sounds.  Good peripheral circulation. Respiratory: Normal respiratory effort.  No retractions. Lungs CTAB. Gastrointestinal: Soft and nontender. No distention. No CVA tenderness. Genitourinary: deferred Musculoskeletal: No lower extremity tenderness nor edema.  No joint effusions. 2+ DP pulse in bilateral lower extremities. Full active painless range of motion at all the large joints in the right leg. No calf tenderness, swelling, or erythema. Neurologic:  Normal speech and language. No gross focal neurologic deficits are appreciated. No gait instability. Skin:  Skin is warm, dry and intact. No rash noted. Psychiatric: Mood and affect are normal. Speech and behavior are normal.  ____________________________________________   LABS (all  labs ordered are listed, but only abnormal results are displayed)  Labs Reviewed - No data to display ____________________________________________  EKG  none ____________________________________________  RADIOLOGY  Venous doppler ultrasound IMPRESSION: No evidence of right lower extremity deep venous thrombosis. Electronically Signed ____________________________________________   PROCEDURES  Procedure(s) performed: None  Procedures  Critical Care performed: No  ____________________________________________   INITIAL IMPRESSION / ASSESSMENT AND PLAN / ED COURSE  Pertinent labs & imaging results that were available during my care of the patient were reviewed by me and considered in my medical decision making (see chart for details).  DENESIA DONELAN is a 60 y.o. female with history of COPD, history  of treated breast cancer, history of hypertension who presents for evaluation of 3 weeks of atraumatic right leg pain, worse after a long day of standing on her feet, intermittent, currently mild. On exam, she is well-appearing and in no acute distress. Vital signs are notable for hypertension, she has a history of the same and is asymptomatic. Will give her home dose of amlodipine. She is neurovascularly intact in the right leg and exam is atraumatic. I suspect this may be muscular skeletal pain however given her history of treated breast cancer, given her concern for DVT, we'll obtain venous Doppler ultrasound to rule out DVT. She has refused any medications for pain stating that her pain is mild.  ----------------------------------------- 10:25 AM on 05/10/2016 ----------------------------------------- Venous Doppler ultrasound negative for DVT. Suspect musculoskeletal pain. We discussed return precautions, symptomatic treatment and the patient is comfortable with the discharge plan. DC home. BP has improved spontaneously.  Clinical Course      ____________________________________________   FINAL CLINICAL IMPRESSION(S) / ED DIAGNOSES  Final diagnoses:  Right leg pain      NEW MEDICATIONS STARTED DURING THIS VISIT:  New Prescriptions   No medications on file     Note:  This document was prepared using Dragon voice recognition software and may include unintentional dictation errors.    Joanne Gavel, MD 05/10/16 [place] Chriss Mannan, MD 05/10/16 1027

## 2016-05-22 ENCOUNTER — Telehealth: Payer: Self-pay | Admitting: *Deleted

## 2016-05-22 NOTE — Telephone Encounter (Signed)
Called to inform us that they have opened her to case management services and we can call her for any questions or problems

## 2016-05-25 ENCOUNTER — Encounter: Payer: Self-pay | Admitting: Internal Medicine

## 2016-05-25 ENCOUNTER — Ambulatory Visit (INDEPENDENT_AMBULATORY_CARE_PROVIDER_SITE_OTHER): Payer: BLUE CROSS/BLUE SHIELD | Admitting: Internal Medicine

## 2016-05-25 ENCOUNTER — Encounter (INDEPENDENT_AMBULATORY_CARE_PROVIDER_SITE_OTHER): Payer: Self-pay

## 2016-05-25 VITALS — BP 124/68 | HR 99 | Ht 66.0 in | Wt 148.6 lb

## 2016-05-25 DIAGNOSIS — J984 Other disorders of lung: Secondary | ICD-10-CM | POA: Diagnosis not present

## 2016-05-25 DIAGNOSIS — R0609 Other forms of dyspnea: Secondary | ICD-10-CM

## 2016-05-25 DIAGNOSIS — J449 Chronic obstructive pulmonary disease, unspecified: Secondary | ICD-10-CM | POA: Diagnosis not present

## 2016-05-25 DIAGNOSIS — R911 Solitary pulmonary nodule: Secondary | ICD-10-CM

## 2016-05-25 NOTE — Progress Notes (Signed)
[facility] Consultation      MRN# [id number] Danielle [name] [birth year]   CC: Chief Complaint  Patient presents with  . Follow-up    88morov. bronch results. breathing is baseline. c/o cont sob w/exertion, non prod cough & occ wheezing.       Brief History: 60year old female past medical history of breast cancer, COPD, seen in consultation for right upper lobe and right middle lobe groundglass opacities and evaluation for bronchoscopy, now status post bronchoscopy with atypical cells from ENB bronchoscopy, will follow with oncology.   Events since last clinic visit:  Patient presents today for follow-up visit of her bronchoscopy. At her last visit she was referred by hematology/oncology for right middle lobe and right upper lobe groundglass opacities in the setting of a smoker with a history of breast cancer. ENB bronchoscopy was performed, brushings, BAL, forceps biopsies, needle biopsies were taking of the right middle lobe using electronic navigational bronchoscopy. Today patient denies any significant shortness of breath that is worse, has a chronic nonproductive cough and occasional wheeze. She's continue with her current inhalers. She continues to follows with Dr. FVella Kohlerfor her COPD management.    Review of Systems  Constitutional: Negative for fever.  Eyes: Negative for blurred vision.  Respiratory: Positive for cough, shortness of breath and wheezing. Negative for hemoptysis.   Cardiovascular: Negative for chest pain.  Gastrointestinal: Negative for heartburn.  Genitourinary: Negative for dysuria.  Skin: Negative for rash.  Neurological: Negative for headaches.  Endo/Heme/Allergies: Does not bruise/bleed easily.      Allergies:  Review of patient's allergies indicates no known allergies.  Physical Examination:  VS: BP 124/68 (BP Location: Right Arm, Cuff Size: Normal)   Pulse 99   Ht '5\' 6"'$  (1.676 m)   Wt 148 lb 9.6 oz (67.4 kg)    SpO2 94%   BMI 23.98 kg/m   General Appearance: No distress  HEENT: PERRLA, no ptosis, no other lesions noticed Pulmonary:normal breath sounds., diaphragmatic excursion normal.No wheezing, No rales   Cardiovascular:  Normal S1,S2.  No m/r/g.     Abdomen:Exam: Benign, Soft, non-tender, No masses  Skin:   warm, no rashes, no ecchymosis  Extremities: normal, no cyanosis, clubbing, warm with normal capillary refill.      Rad results: (The following images and results were reviewed by Dr. MStevenson Clinch. 04/11/2016 ENB Brushing and FNA of RML lesion - atypical cells ENB BAL - negative for malignancy  Assessment and Plan: 60year old female past medical history of breast cancer, COPD, chronic short of breath, seen in follow-up for recent bronchoscopy with biopsy of right middle lobe lesion Lesion of right lung Status post ENB bronchoscopy with brushing, forceps biopsy, BAL, FNA. ENB brushing and FNA of the right middle lobe lesion showed atypical cells, ENB BAL was negative for malignancy, ENB forceps biopsy was negative for malignancy.  I discussed the results with the patient and given the atypical cells on brushing and FNA of the right middle lobe lesion, I suggested that patient continue to follow-up with hematology oncology for which she has an appointment with. At this time no significant night sweats or weight loss noted.  Plan: -Continue with COPD management as directed by Dr. FVella Kohler-Continue with follow-up with hematology oncology  DOE (dyspnea on exertion) Dyspnea on exertion secondary to COPD.  Plan: -Continue with current COPD inhalers -Continue follow-up with primary pulmonologist-Dr. FVella Kohler Moderate COPD (chronic obstructive pulmonary disease) (HCC) Currently on Symbicort and Spiriva Can worsening of her COPD  symptoms: Chronic cough, chronic shortness of breath with exertion. Patient has quit smoking for a number of years now.  Plan: -Continue with Symbicort and  Spiriva -Continue with tobacco avoidance -Continue with coronary hygiene -Further recommendations from primary pulmonologist -Dr. Vella Kohler   Updated Medication List Outpatient Encounter Prescriptions as of 05/25/2016  Medication Sig  . albuterol (PROAIR HFA) 108 (90 BASE) MCG/ACT inhaler Inhale 1-2 puffs into the lungs every 6 (six) hours as needed for wheezing or shortness of breath.   [name] amLODipine (NORVASC) 5 MG tablet Take 5 mg by mouth daily.   . budesonide-formoterol (SYMBICORT) 160-4.5 MCG/ACT inhaler Inhale 2 puffs into the lungs 2 (two) times daily.  . celecoxib (CELEBREX) 200 MG capsule Take 1 capsule (200 mg total) by mouth daily.  . citalopram (CELEXA) 20 MG tablet Take 20 mg by mouth daily.   [name] losartan-hydrochlorothiazide (HYZAAR) 100-25 MG tablet Take 1 tablet by mouth daily.  . potassium chloride SA (K-DUR,KLOR-CON) 20 MEQ tablet Take 1 tablet (20 mEq total) by mouth daily.  [name] tiotropium (SPIRIVA) 18 MCG inhalation capsule Place into inhaler and inhale.   No facility-administered encounter medications on file as of 05/25/2016.     Orders for this visit: No orders of the defined types were placed in this encounter.   Thank  you for the visitation and for allowing  [facility] to assist in the care of your patient. Our recommendations are noted above.  Please contact us if we can be of further service.  Vilinda Boehringer, MD [facility] Office Number: [phone]  Note: This note was prepared with Dragon dictation along with smaller phrase technology. Any transcriptional errors that result from this process are unintentional.

## 2016-05-25 NOTE — Assessment & Plan Note (Signed)
Status post ENB bronchoscopy with brushing, forceps biopsy, BAL, FNA. ENB brushing and FNA of the right middle lobe lesion showed atypical cells, ENB BAL was negative for malignancy, ENB forceps biopsy was negative for malignancy.  I discussed the results with the patient and given the atypical cells on brushing and FNA of the right middle lobe lesion, I suggested that patient continue to follow-up with hematology oncology for which she has an appointment with. At this time no significant night sweats or weight loss noted.  Plan: -Continue with COPD management as directed by Dr. Vella Kohler -Continue with follow-up with hematology oncology

## 2016-05-25 NOTE — Assessment & Plan Note (Signed)
Currently on Symbicort and Spiriva Can worsening of her COPD symptoms: Chronic cough, chronic shortness of breath with exertion. Patient has quit smoking for a number of years now.  Plan: -Continue with Symbicort and Spiriva -Continue with tobacco avoidance -Continue with coronary hygiene -Further recommendations from primary pulmonologist -Dr. Vella Kohler

## 2016-05-25 NOTE — Patient Instructions (Addendum)
Follow up with Dr. Stevenson Clinch as needed - Keep your appointment with Dr. Rogue Bussing on 06/22/16 @ 2.30pm - continue to avoid smoking and second hand exposure - cont with your inhalers - cont with follow up of your COPD with Dr. Raul Del.

## 2016-05-25 NOTE — Assessment & Plan Note (Signed)
Dyspnea on exertion secondary to COPD.  Plan: -Continue with current COPD inhalers -Continue follow-up with primary pulmonologist-Dr. Vella Kohler

## 2016-06-22 ENCOUNTER — Encounter: Payer: Self-pay | Admitting: Internal Medicine

## 2016-06-22 ENCOUNTER — Other Ambulatory Visit: Payer: Self-pay

## 2016-06-22 ENCOUNTER — Inpatient Hospital Stay (HOSPITAL_BASED_OUTPATIENT_CLINIC_OR_DEPARTMENT_OTHER): Payer: BLUE CROSS/BLUE SHIELD | Admitting: Internal Medicine

## 2016-06-22 ENCOUNTER — Inpatient Hospital Stay: Payer: BLUE CROSS/BLUE SHIELD | Attending: Internal Medicine

## 2016-06-22 VITALS — BP 117/81 | HR 92 | Temp 97.2°F | Resp 19 | Ht 66.0 in | Wt 148.4 lb

## 2016-06-22 DIAGNOSIS — I10 Essential (primary) hypertension: Secondary | ICD-10-CM

## 2016-06-22 DIAGNOSIS — Z9221 Personal history of antineoplastic chemotherapy: Secondary | ICD-10-CM | POA: Diagnosis not present

## 2016-06-22 DIAGNOSIS — C50911 Malignant neoplasm of unspecified site of right female breast: Secondary | ICD-10-CM

## 2016-06-22 DIAGNOSIS — C50812 Malignant neoplasm of overlapping sites of left female breast: Secondary | ICD-10-CM

## 2016-06-22 DIAGNOSIS — J449 Chronic obstructive pulmonary disease, unspecified: Secondary | ICD-10-CM | POA: Diagnosis not present

## 2016-06-22 DIAGNOSIS — F419 Anxiety disorder, unspecified: Secondary | ICD-10-CM

## 2016-06-22 DIAGNOSIS — R0602 Shortness of breath: Secondary | ICD-10-CM

## 2016-06-22 DIAGNOSIS — E119 Type 2 diabetes mellitus without complications: Secondary | ICD-10-CM

## 2016-06-22 DIAGNOSIS — M129 Arthropathy, unspecified: Secondary | ICD-10-CM

## 2016-06-22 DIAGNOSIS — R011 Cardiac murmur, unspecified: Secondary | ICD-10-CM | POA: Insufficient documentation

## 2016-06-22 DIAGNOSIS — Z853 Personal history of malignant neoplasm of breast: Secondary | ICD-10-CM | POA: Insufficient documentation

## 2016-06-22 DIAGNOSIS — R05 Cough: Secondary | ICD-10-CM | POA: Diagnosis not present

## 2016-06-22 DIAGNOSIS — Z9012 Acquired absence of left breast and nipple: Secondary | ICD-10-CM | POA: Insufficient documentation

## 2016-06-22 DIAGNOSIS — Z923 Personal history of irradiation: Secondary | ICD-10-CM

## 2016-06-22 DIAGNOSIS — R918 Other nonspecific abnormal finding of lung field: Secondary | ICD-10-CM | POA: Diagnosis not present

## 2016-06-22 DIAGNOSIS — Z87891 Personal history of nicotine dependence: Secondary | ICD-10-CM

## 2016-06-22 DIAGNOSIS — C3410 Malignant neoplasm of upper lobe, unspecified bronchus or lung: Secondary | ICD-10-CM

## 2016-06-22 DIAGNOSIS — Z803 Family history of malignant neoplasm of breast: Secondary | ICD-10-CM | POA: Diagnosis not present

## 2016-06-22 DIAGNOSIS — R911 Solitary pulmonary nodule: Secondary | ICD-10-CM

## 2016-06-22 DIAGNOSIS — Z170 Estrogen receptor positive status [ER+]: Secondary | ICD-10-CM

## 2016-06-22 DIAGNOSIS — D649 Anemia, unspecified: Secondary | ICD-10-CM

## 2016-06-22 DIAGNOSIS — Z79899 Other long term (current) drug therapy: Secondary | ICD-10-CM

## 2016-06-22 DIAGNOSIS — C3411 Malignant neoplasm of upper lobe, right bronchus or lung: Secondary | ICD-10-CM

## 2016-06-22 LAB — IRON AND TIBC
Iron: 97 ug/dL (ref 28–170)
Saturation Ratios: 29 % (ref 10.4–31.8)
TIBC: 334 ug/dL (ref 250–450)
UIBC: 237 ug/dL

## 2016-06-22 LAB — CBC WITH DIFFERENTIAL/PLATELET
BASOS ABS: 0.1 10*3/uL (ref 0–0.1)
Basophils Relative: 2 %
EOS ABS: 0.2 10*3/uL (ref 0–0.7)
EOS PCT: 5 %
HCT: 31.8 % — ABNORMAL LOW (ref 35.0–47.0)
HEMOGLOBIN: 10.5 g/dL — AB (ref 12.0–16.0)
LYMPHS ABS: 1.8 10*3/uL (ref 1.0–3.6)
LYMPHS PCT: 34 %
MCH: 31.7 pg (ref 26.0–34.0)
MCHC: 33.2 g/dL (ref 32.0–36.0)
MCV: 95.4 fL (ref 80.0–100.0)
Monocytes Absolute: 0.5 10*3/uL (ref 0.2–0.9)
Monocytes Relative: 9 %
NEUTROS PCT: 50 %
Neutro Abs: 2.5 10*3/uL (ref 1.4–6.5)
PLATELETS: 261 10*3/uL (ref 150–440)
RBC: 3.33 MIL/uL — AB (ref 3.80–5.20)
RDW: 12.9 % (ref 11.5–14.5)
WBC: 5.1 10*3/uL (ref 3.6–11.0)

## 2016-06-22 LAB — COMPREHENSIVE METABOLIC PANEL
ALK PHOS: 84 U/L (ref 38–126)
ALT: 33 U/L (ref 14–54)
AST: 35 U/L (ref 15–41)
Albumin: 4.3 g/dL (ref 3.5–5.0)
Anion gap: 9 (ref 5–15)
BUN: 32 mg/dL — AB (ref 6–20)
CALCIUM: 9.5 mg/dL (ref 8.9–10.3)
CHLORIDE: 105 mmol/L (ref 101–111)
CO2: 25 mmol/L (ref 22–32)
CREATININE: 1.08 mg/dL — AB (ref 0.44–1.00)
GFR calc non Af Amer: 54 mL/min — ABNORMAL LOW (ref >60)
GLUCOSE: 112 mg/dL — AB (ref 65–99)
Potassium: 3.8 mmol/L (ref 3.5–5.1)
SODIUM: 139 mmol/L (ref 135–145)
Total Bilirubin: 0.5 mg/dL (ref 0.3–1.2)
Total Protein: 7.8 g/dL (ref 6.5–8.1)

## 2016-06-22 LAB — FERRITIN: Ferritin: 66 ng/mL (ref 11–307)

## 2016-06-22 NOTE — Progress Notes (Signed)
[facility] OFFICE PROGRESS NOTE  Patient Care Team: Marguerita Merles, MD as PCP - General (Family Medicine)  Breast cancer in female Danielle [name])   Staging form: Breast, AJCC 7th Edition   - Clinical: Stage IIIA (T3, N1, M0) - Signed by [name], NP on 02/21/2016  Lung cancer, upper lobe ([place])   Staging form: Lung, AJCC 7th Edition   - Clinical stage from 10/23/2014: T1, N0, M0 - Signed by [name], NP on 02/21/2016   Oncology History   # 2000-  Left breast cancer [T3N1- stage III] mastecomy s/p RT; NO chemo; Tamoxifen  # Right lung nodules- s/p Bronch- atypical cells [[name]]; last CT May 2017     Breast cancer in female [facility])   07/24/1999 Initial Diagnosis    Breast cancer in female ([place]) T3 N1 M0 tumor ER/PR positive      08/24/1999 -  Anti-estrogen oral therapy    Started tamoxifen      08/24/1999 Surgery    Status post modified radical mastectomy of left breast       Chemotherapy          Radiation Therapy         11/24/2003 - 02/20/2009 Anti-estrogen oral therapy    Started Aromasin       Lung cancer, upper lobe ([place])   08/23/2014 Initial Diagnosis    Lung cancer, upper lobe of right lung ([place]) T1 N0 M0       Cancer of overlapping sites of left female breast ([place])   06/22/2016 Initial Diagnosis    Cancer of overlapping sites of left female breast [name])     This is my first interaction with the patient as patient's primary oncologist has been Dr.Choksi. I reviewed the patient's prior charts/pertinent labs/imaging in detail; findings are summarized above.     INTERVAL HISTORY:  Danielle [name] 60 y.o.  female pleasant patient above history ofBreast cancer and also history of right lung nodules is here for follow-up.  Patient has chronic shortness of breath chronic cough or any worse. No hemoptysis. Denies any chest pain. Denies any lumps bumps. No weight loss. No bone pain.    REVIEW OF SYSTEMS:  A complete 10 point review of system is  done which is negative except mentioned above/history of present illness.   PAST MEDICAL HISTORY :  Past Medical History:  Diagnosis Date  . Anxiety   . Arthritis   . Breast cancer in female [facility]) 02/21/2016  . Cancer ([place]) left    breast cancer 2000  . COPD (chronic obstructive pulmonary disease) ([place])   . Diabetes mellitus without complication ([place])   . Heart murmur   . Hypertension   . Lung nodule   . Shortness of breath dyspnea    with exertion  . Status post chemotherapy 2001   left breast cancer  . Status post radiation therapy 2001   left breast cancer    PAST SURGICAL HISTORY :   Past Surgical History:  Procedure Laterality Date  . Breast Biospy Left    [facility]  . BREAST SURGERY    . DILATION AND CURETTAGE OF UTERUS    . ELECTROMAGNETIC NAVIGATION BROCHOSCOPY Right 04/11/2016   Procedure: ELECTROMAGNETIC NAVIGATION BRONCHOSCOPY;  Surgeon: Vilinda Boehringer, MD;  Location: [facility] ORS;  Service: Cardiopulmonary;  Laterality: Right;  . LUNG BIOPSY    . MASTECTOMY Left    2000, [facility]  . ROTATOR CUFF REPAIR Right    [facility]  FAMILY HISTORY :   Family History  Problem Relation Age of Onset  . Breast cancer Mother 75  . Cancer Mother     Breast   . Breast cancer Paternal Aunt 38  . Cancer Maternal Aunt     Breast     SOCIAL HISTORY:   Social History  Substance Use Topics  . Smoking status: Former Smoker    Packs/day: 0.50    Years: 20.00    Types: Cigarettes    Quit date: 07/02/2012  . Smokeless tobacco: Never Used     Comment: quit 2014  . Alcohol use Yes     Comment: occ    ALLERGIES:  has No Known Allergies.  MEDICATIONS:  Current Outpatient Prescriptions  Medication Sig Dispense Refill  . albuterol (PROAIR HFA) 108 (90 BASE) MCG/ACT inhaler Inhale 1-2 puffs into the lungs every 6 (six) hours as needed for wheezing or shortness of breath.     . budesonide-formoterol (SYMBICORT) 160-4.5 MCG/ACT inhaler Inhale 2 puffs into the lungs 2 (two) times daily.    .  celecoxib (CELEBREX) 200 MG capsule Take 1 capsule (200 mg total) by mouth daily. 30 capsule 0  . citalopram (CELEXA) 20 MG tablet Take 20 mg by mouth daily.     [name] losartan-hydrochlorothiazide (HYZAAR) 100-25 MG tablet Take 1 tablet by mouth daily. 30 tablet 0  . potassium chloride SA (K-DUR,KLOR-CON) 20 MEQ tablet Take 1 tablet (20 mEq total) by mouth daily. 30 tablet 6  . tiotropium (SPIRIVA) 18 MCG inhalation capsule Place into inhaler and inhale.    [name] amLODipine (NORVASC) 10 MG tablet Take 10 mg by mouth daily. for blood pressure  3   No current facility-administered medications for this visit.     PHYSICAL EXAMINATION: ECOG PERFORMANCE STATUS: 0 - Asymptomatic  BP 117/81 (BP Location: Right Arm, Patient Position: Sitting)   Pulse 92   Temp 97.2 F (36.2 C) (Tympanic)   Resp 19   Ht '5\' 6"'$  (1.676 m)   Wt 148 lb 6.4 oz (67.3 kg)   SpO2 96%   BMI 23.95 kg/m   Filed Weights   06/22/16 1442  Weight: 148 lb 6.4 oz (67.3 kg)    GENERAL: Well-nourished well-developed; Alert, no distress and comfortable.   Alone.  EYES: no pallor or icterus OROPHARYNX: no thrush or ulceration; good dentition  NECK: supple, no masses felt LYMPH:  no palpable lymphadenopathy in the cervical, axillary or inguinal regions LUNGS: clear to auscultation and  No wheeze or crackles HEART/CVS: regular rate & rhythm and no murmurs; No lower extremity edema ABDOMEN:abdomen soft, non-tender and normal bowel sounds Musculoskeletal:no cyanosis of digits and no clubbing  PSYCH: alert & oriented x 3 with fluent speech NEURO: no focal motor/sensory deficits SKIN:  no rashes or significant lesions  LABORATORY DATA:  I have reviewed the data as listed    Component Value Date/Time   NA 139 06/22/2016 1421   NA 132 (L) 02/09/2015 1100   K 3.8 06/22/2016 1421   K 3.8 02/09/2015 1100   CL 105 06/22/2016 1421   CL 95 (L) 02/09/2015 1100   CO2 25 06/22/2016 1421   CO2 29 02/09/2015 1100   GLUCOSE 112 (H)  06/22/2016 1421   GLUCOSE 105 (H) 02/09/2015 1100   BUN 32 (H) 06/22/2016 1421   BUN 16 02/09/2015 1100   CREATININE 1.08 (H) 06/22/2016 1421   CREATININE 0.81 02/09/2015 1100   CALCIUM 9.5 06/22/2016 1421   CALCIUM 9.1 02/09/2015 1100  PROT 7.8 06/22/2016 1421   PROT 7.7 02/09/2015 1100   ALBUMIN 4.3 06/22/2016 1421   ALBUMIN 4.3 02/09/2015 1100   AST 35 06/22/2016 1421   AST 29 02/09/2015 1100   ALT 33 06/22/2016 1421   ALT 20 02/09/2015 1100   ALKPHOS 84 06/22/2016 1421   ALKPHOS 69 02/09/2015 1100   BILITOT 0.5 06/22/2016 1421   BILITOT 0.9 02/09/2015 1100   GFRNONAA 54 (L) 06/22/2016 1421   GFRNONAA >60 02/09/2015 1100   GFRAA >60 06/22/2016 1421   GFRAA >60 02/09/2015 1100    No results found for: SPEP, UPEP  Lab Results  Component Value Date   WBC 5.1 06/22/2016   NEUTROABS 2.5 06/22/2016   HGB 10.5 (L) 06/22/2016   HCT 31.8 (L) 06/22/2016   MCV 95.4 06/22/2016   PLT 261 06/22/2016      Chemistry      Component Value Date/Time   NA 139 06/22/2016 1421   NA 132 (L) 02/09/2015 1100   K 3.8 06/22/2016 1421   K 3.8 02/09/2015 1100   CL 105 06/22/2016 1421   CL 95 (L) 02/09/2015 1100   CO2 25 06/22/2016 1421   CO2 29 02/09/2015 1100   BUN 32 (H) 06/22/2016 1421   BUN 16 02/09/2015 1100   CREATININE 1.08 (H) 06/22/2016 1421   CREATININE 0.81 02/09/2015 1100      Component Value Date/Time   CALCIUM 9.5 06/22/2016 1421   CALCIUM 9.1 02/09/2015 1100   ALKPHOS 84 06/22/2016 1421   ALKPHOS 69 02/09/2015 1100   AST 35 06/22/2016 1421   AST 29 02/09/2015 1100   ALT 33 06/22/2016 1421   ALT 20 02/09/2015 1100   BILITOT 0.5 06/22/2016 1421   BILITOT 0.9 02/09/2015 1100       RADIOGRAPHIC STUDIES: I have personally reviewed the radiological images as listed and agreed with the findings in the report. No results found.   ASSESSMENT & PLAN:  Cancer of overlapping sites of left female breast ([place]) # Right lung Lung nodules- bronchoscopy biopsy atypical  cells; inconclusive for malignancy. Recommend PET in 3 weeks;   # breast ca- NED  # Anemia- hb-10.5; add iron studies/ ferritin  # follow up in 4 weeks.    Orders Placed This Encounter  Procedures  . NM PET Image Restag (PS) Skull Base To Thigh    Standing Status:   Future    Standing Expiration Date:   08/22/2017    Order Specific Question:   Reason for Exam (SYMPTOM  OR DIAGNOSIS REQUIRED)    Answer:   lung nodule    Order Specific Question:   Preferred imaging location?    Answer:   [facility]  . Ferritin    Standing Status:   Future    Number of Occurrences:   1    Standing Expiration Date:   06/22/2017  . Iron and TIBC    Standing Status:   Future    Number of Occurrences:   1    Standing Expiration Date:   06/22/2017   All questions were answered. The patient knows to call the clinic with any problems, questions or concerns.      [name], MD 06/22/2016 5:38 PM

## 2016-06-22 NOTE — Assessment & Plan Note (Addendum)
#   Right lung Lung nodules- bronchoscopy biopsy atypical cells; inconclusive for malignancy. Recommend PET in 3 weeks;   # breast ca- NED  # Anemia- hb-10.5; add iron studies/ ferritin  # follow up in 4 weeks.

## 2016-06-22 NOTE — Progress Notes (Signed)
Pt reports she has SOB on on exertion and has been seen for place on lung seen by Dr. Sharlene Dory, [place], and Choksi.    Reports right hip pain that shoots to her knee.

## 2016-06-22 NOTE — Progress Notes (Signed)
.  gb 

## 2016-06-24 IMAGING — CT CT CHEST W/ CM
2 of 3 series · 15 of 36 positions shown, 18 images · IV contrast (omnipaque)
Comparison: PET 09/24/2014 and CT chest 09/06/2014. CT abdomen
02/23/2010.

CLINICAL DATA: Left breast cancer with mastectomy, chemotherapy and
radiation therapy. Lung mass, subsequent encounter.

EXAM:
CT CHEST WITH CONTRAST
TECHNIQUE: Multidetector CT imaging of the chest was performed during
intravenous contrast administration.
CONTRAST:  75 cc Omnipaque 300.

[Series 2: routine chest with · axial · 0.62mm/px · z∈[-675,-425]mm · 12 of 60 slices shown, 15 images]
[im 5/60  mediastinal]
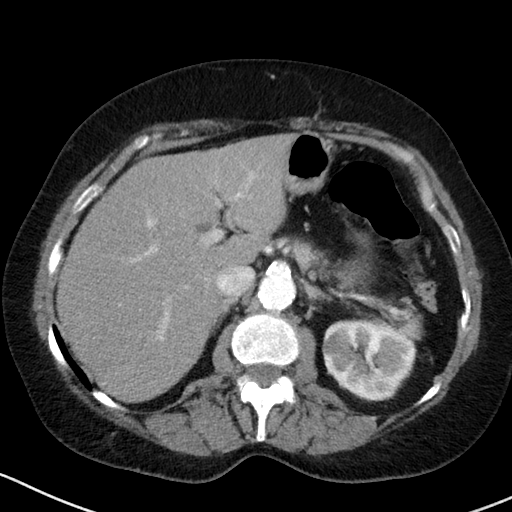
[im 5/60  lung]
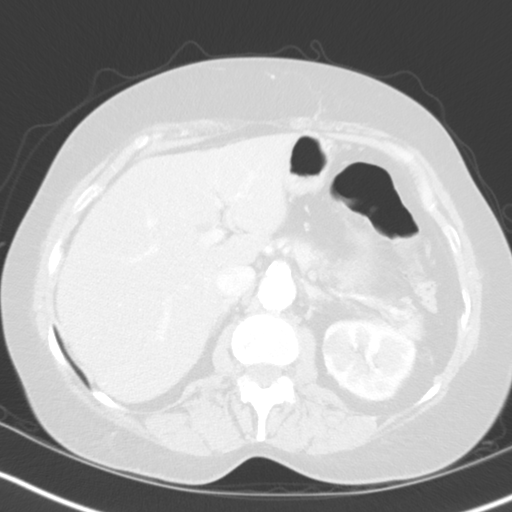
[im 9/60  lung]
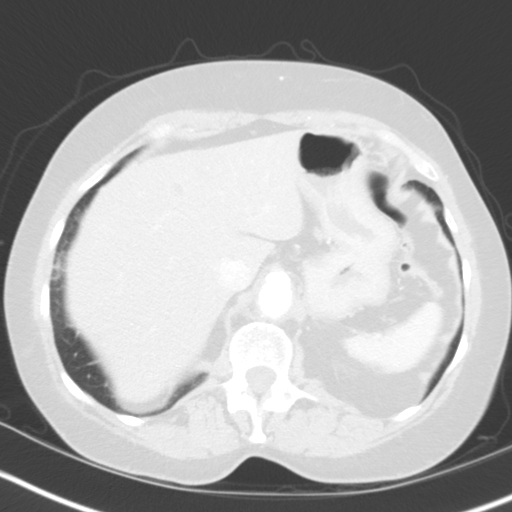
[im 14/60  lung]
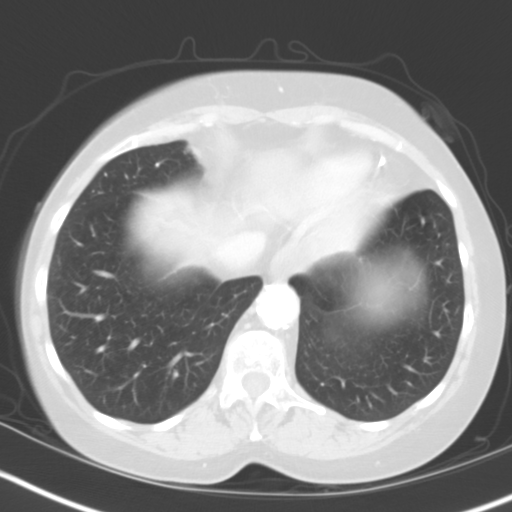
[im 18/60  lung]
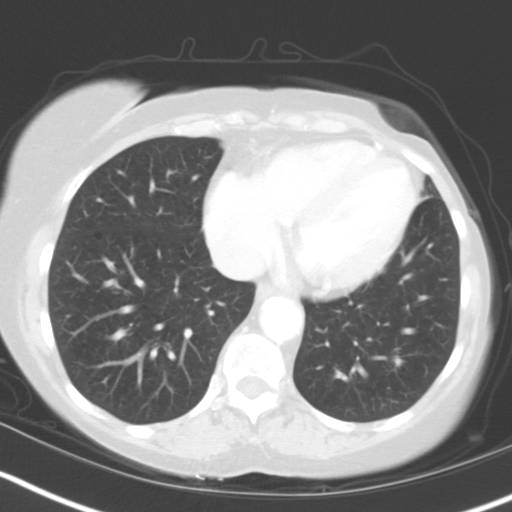
[im 22/60  mediastinal]
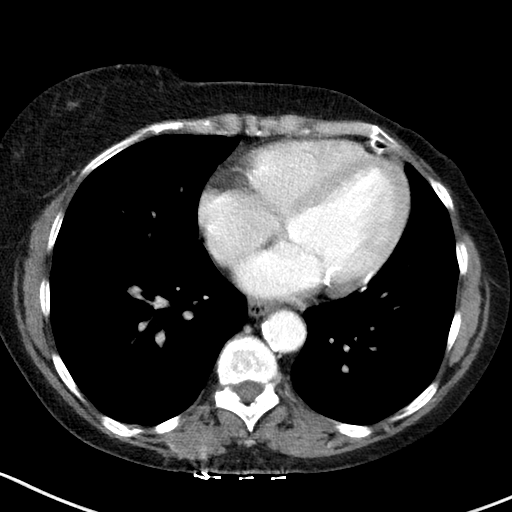
[im 22/60  lung]
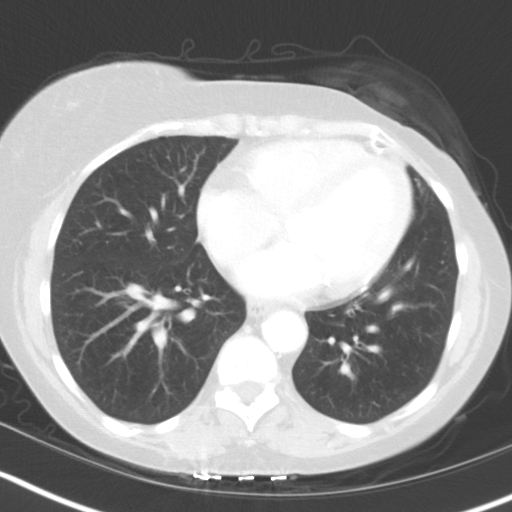
[im 27/60  lung]
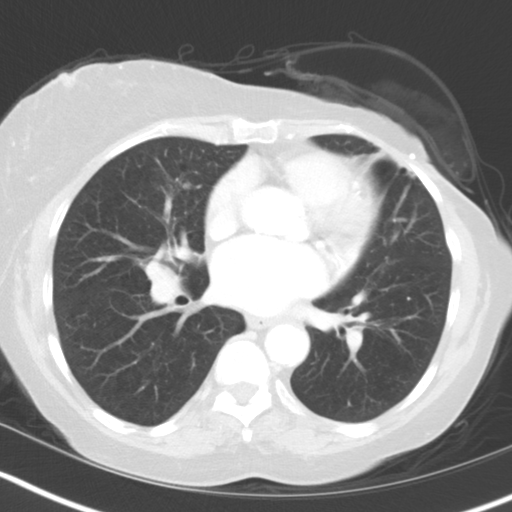
[im 33/60  lung]
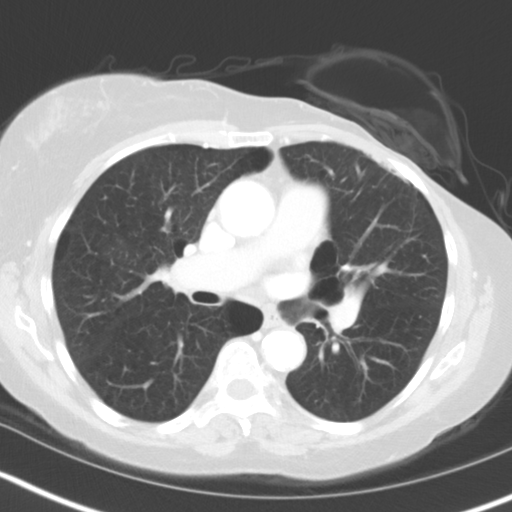
[im 38/60  lung]
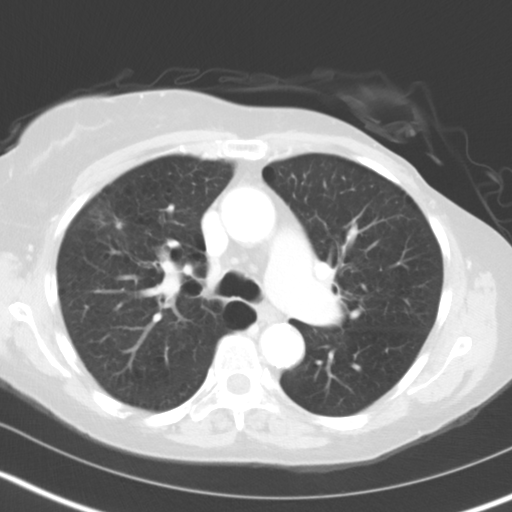
[im 42/60  mediastinal]
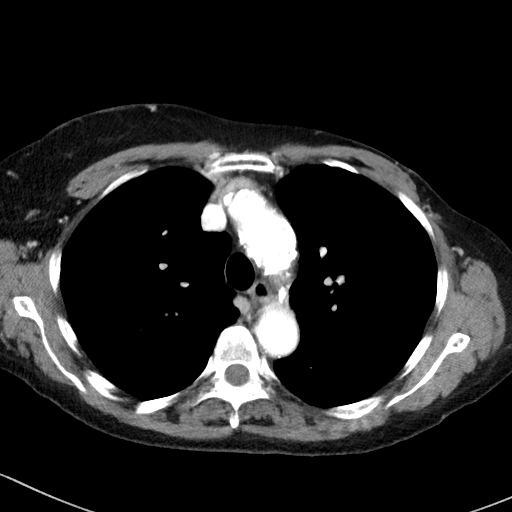
[im 42/60  lung]
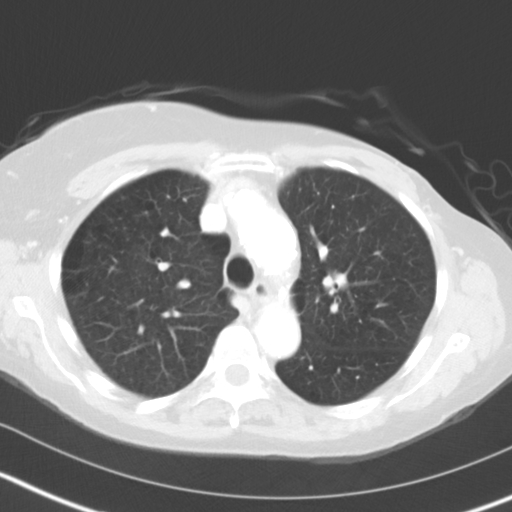
[im 46/60  lung]
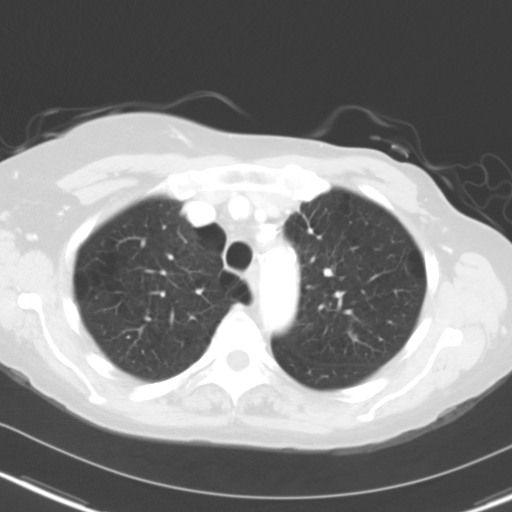
[im 51/60  lung]
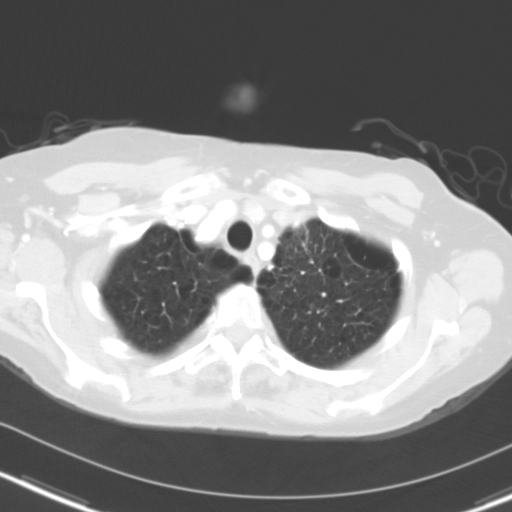
[im 55/60  lung]
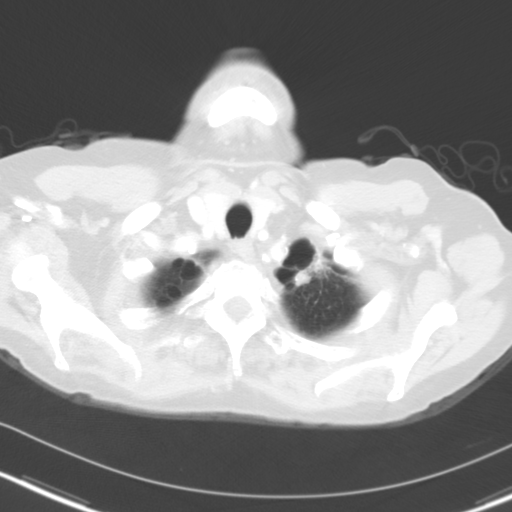

[Series 5: cor routine chest with · coronal · 0.58mm/px · 3 of 127 slices shown]
[im 26/127  lung]
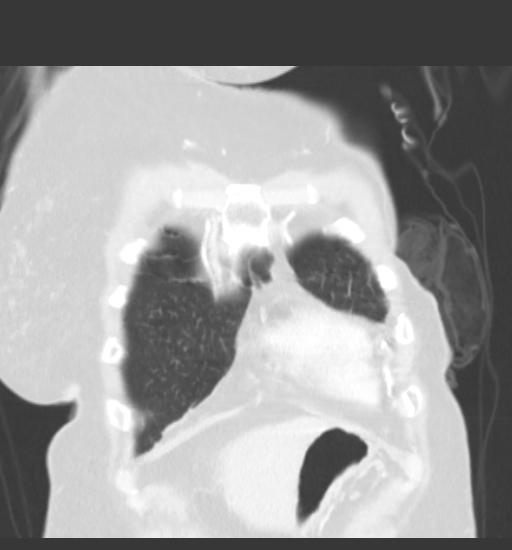
[im 51/127  lung]
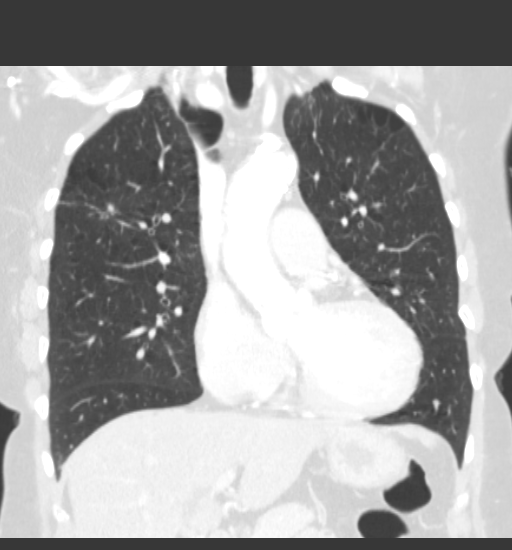
[im 76/127  lung]
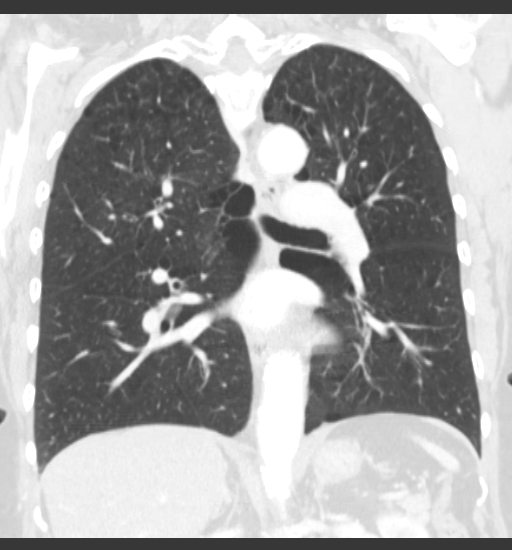

[15 of 36 positions shown; findings below may reference images not displayed]

FINDINGS: Mediastinum/Nodes: Left thyroid nodule measures 9 mm. No
pathologically enlarged mediastinal, hilar or axillary lymph nodes.
Extensive 3 vessel coronary artery calcification. Heart is mildly
enlarged. No pericardial effusion.

Lungs/Pleura: Bullous changes in the apices. Centrilobular
emphysema. Spiculated nodule in the anterior segment right upper
lobe measures 6 x 9 mm (series 3, image 24), stable. Amorphous
nodularity and surrounding ground-glass in the medial segment right
middle lobe (series 3, image 31) appears slightly progressive. 10 mm
nodule in the apical left upper lobe (image 6), stable when
remeasured on the prior study. Subpleural radiation fibrosis in the
left upper lobe. No pleural fluid. Airway is unremarkable.

Upper abdomen: Low-density lesion in the left hepatic lobe measures
7 mm, unchanged from 02/23/2010. Visualized portions of the adrenal
glands, left kidney, spleen, pancreas, stomach and bowel are grossly
unremarkable. No upper abdominal adenopathy.

Musculoskeletal: No worrisome lytic or sclerotic lesions.
IMPRESSION: 1. Amorphous collection of nodularity and ground-glass in the medial
segment right middle lobe is difficult to measure but appears
slightly progressive when compared 09/06/2014 and is definitely
progressive from 02/23/2010, favoring low-grade adenocarcinoma.
2. Spiculated right lower lobe nodule is unchanged.
3. Extensive 3 vessel coronary artery calcification.

## 2016-06-26 ENCOUNTER — Other Ambulatory Visit: Payer: BLUE CROSS/BLUE SHIELD

## 2016-06-26 ENCOUNTER — Ambulatory Visit: Payer: BLUE CROSS/BLUE SHIELD | Admitting: Family Medicine

## 2016-07-13 ENCOUNTER — Ambulatory Visit
Admission: RE | Admit: 2016-07-13 | Discharge: 2016-07-13 | Disposition: A | Discharge status: Home / Self Care | Payer: BLUE CROSS/BLUE SHIELD | Source: Ambulatory Visit | Attending: Internal Medicine | Admitting: Internal Medicine

## 2016-07-13 DIAGNOSIS — R918 Other nonspecific abnormal finding of lung field: Secondary | ICD-10-CM | POA: Insufficient documentation

## 2016-07-13 LAB — GLUCOSE, CAPILLARY: GLUCOSE-CAPILLARY: 78 mg/dL (ref 65–99)

## 2016-07-13 MED ORDER — FLUDEOXYGLUCOSE F - 18 (FDG) INJECTION
12.1800 | Freq: Once | INTRAVENOUS | Status: AC | PRN
  Administered 2016-07-13: 12.18 via INTRAVENOUS

## 2016-07-20 ENCOUNTER — Inpatient Hospital Stay: Payer: BLUE CROSS/BLUE SHIELD | Admitting: Internal Medicine

## 2016-07-25 ENCOUNTER — Inpatient Hospital Stay: Payer: BLUE CROSS/BLUE SHIELD | Attending: Internal Medicine | Admitting: Internal Medicine

## 2016-07-25 ENCOUNTER — Encounter: Payer: Self-pay | Admitting: Internal Medicine

## 2016-07-25 DIAGNOSIS — C3411 Malignant neoplasm of upper lobe, right bronchus or lung: Secondary | ICD-10-CM | POA: Insufficient documentation

## 2016-07-25 DIAGNOSIS — J449 Chronic obstructive pulmonary disease, unspecified: Secondary | ICD-10-CM | POA: Insufficient documentation

## 2016-07-25 DIAGNOSIS — F419 Anxiety disorder, unspecified: Secondary | ICD-10-CM | POA: Diagnosis not present

## 2016-07-25 DIAGNOSIS — Z923 Personal history of irradiation: Secondary | ICD-10-CM

## 2016-07-25 DIAGNOSIS — Z9012 Acquired absence of left breast and nipple: Secondary | ICD-10-CM | POA: Insufficient documentation

## 2016-07-25 DIAGNOSIS — M25551 Pain in right hip: Secondary | ICD-10-CM | POA: Diagnosis not present

## 2016-07-25 DIAGNOSIS — Z9221 Personal history of antineoplastic chemotherapy: Secondary | ICD-10-CM

## 2016-07-25 DIAGNOSIS — E119 Type 2 diabetes mellitus without complications: Secondary | ICD-10-CM

## 2016-07-25 DIAGNOSIS — Z79899 Other long term (current) drug therapy: Secondary | ICD-10-CM | POA: Insufficient documentation

## 2016-07-25 DIAGNOSIS — D649 Anemia, unspecified: Secondary | ICD-10-CM | POA: Insufficient documentation

## 2016-07-25 DIAGNOSIS — M129 Arthropathy, unspecified: Secondary | ICD-10-CM | POA: Diagnosis not present

## 2016-07-25 DIAGNOSIS — R0602 Shortness of breath: Secondary | ICD-10-CM | POA: Diagnosis not present

## 2016-07-25 DIAGNOSIS — R011 Cardiac murmur, unspecified: Secondary | ICD-10-CM | POA: Diagnosis not present

## 2016-07-25 DIAGNOSIS — R911 Solitary pulmonary nodule: Secondary | ICD-10-CM

## 2016-07-25 DIAGNOSIS — Z853 Personal history of malignant neoplasm of breast: Secondary | ICD-10-CM

## 2016-07-25 NOTE — Progress Notes (Signed)
Pt complains of 10/10 pain in hips at night time.  Here for PET results

## 2016-07-25 NOTE — Assessment & Plan Note (Addendum)
#   Right lung Lung nodules- bronchoscopy biopsy atypical cells; inconclusive for malignancy. PET scan- low level SUV uptake right middle lobe approximately 2 cm in size. We will review the imaging at the tumor conference; the patient will likely need a repeat tissue diagnosis. Pt will likely need SBRT again.   # breast ca- NED  # Anemia- hb-10.5; NOT iron deficient; monitor for now.   # right hip pain- PET scan negative for any bony uptake. Suspect arthritis. Defer to PCP regarding intra-articular injections.   # follow up in 4 weeks; will inform pt of plan post conference.   # I reviewed the blood work- with the patient in detail; also reviewed the imaging independently [as summarized above]; and with the patient in detail.

## 2016-07-25 NOTE — Progress Notes (Signed)
[facility] OFFICE PROGRESS NOTE  Patient Care Team: [facility] as PCP - General  Breast cancer in female [facility])   Staging form: Breast, AJCC 7th Edition   - Clinical: Stage IIIA (T3, N1, M0) - Signed by [name], NP on 02/21/2016  Lung cancer, upper lobe ([place])   Staging form: Lung, AJCC 7th Edition   - Clinical stage from 10/23/2014: T1, N0, M0 - Signed by [name], NP on 02/21/2016   Oncology History   # 2016- JAN RUL 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   # 2000-  Left breast cancer [T3N1- stage III] mastecomy s/p RT; NO chemo; Tamoxifen         Breast cancer in female [facility])   07/24/1999 Initial Diagnosis    Breast cancer in female ([place]) T3 N1 M0 tumor ER/PR positive      08/24/1999 -  Anti-estrogen oral therapy    Started tamoxifen      08/24/1999 Surgery    Status post modified radical mastectomy of left breast       Chemotherapy          Radiation Therapy         11/24/2003 - 02/20/2009 Anti-estrogen oral therapy    Started Aromasin       Lung cancer, upper lobe ([place])   08/23/2014 Initial Diagnosis    Lung cancer, upper lobe of right lung ([place]) T1 N0 M0       Carcinoma of overlapping sites of left breast in female, estrogen receptor positive ([place])   06/22/2016 Initial Diagnosis    Cancer of overlapping sites of left female breast [facility])      Primary cancer of right upper lobe of lung ([place])   07/25/2016 Initial Diagnosis    Primary cancer of right upper lobe of lung ([place])       INTERVAL HISTORY:  Danielle [name] 60 y.o.  female pleasant patient above history ofBreast cancer; Stage I squamous cell lung cancer; and also history of right middle lobe lung nodule is here for follow-up.  Patient complains of right hip pain over the last many months. Denies any other bone pain. Any  headaches. Patient has chronic shortness of breath chronic cough or any worse. No hemoptysis. Denies any chest pain. Denies any lumps bumps. No weight loss.    REVIEW OF SYSTEMS:  A complete 10 point review of system is done which is negative except mentioned above/history of present illness.   PAST MEDICAL HISTORY :  Past Medical History:  Diagnosis Date  . Anxiety   . Arthritis   . Breast cancer in female [facility]) 02/21/2016  . Cancer ([place]) left    breast cancer 2000  . COPD (chronic obstructive pulmonary disease) ([place])   . Diabetes mellitus without complication ([place])   . Heart murmur   . Hypertension   . Lung nodule   . Shortness of breath dyspnea    with exertion  . Status post chemotherapy 2001   left breast cancer  . Status post radiation therapy 2001   left breast cancer    PAST SURGICAL HISTORY :   Past Surgical History:  Procedure Laterality Date  . Breast Biospy Left    [facility]  . BREAST SURGERY    . DILATION AND CURETTAGE OF UTERUS    .  ELECTROMAGNETIC NAVIGATION BROCHOSCOPY Right 04/11/2016   Procedure: ELECTROMAGNETIC NAVIGATION BRONCHOSCOPY;  Surgeon: Vilinda Boehringer, MD;  Location: [facility] ORS;  Service: Cardiopulmonary;  Laterality: Right;  . LUNG BIOPSY    . MASTECTOMY Left    2000, [facility]  . ROTATOR CUFF REPAIR Right    [facility]    FAMILY HISTORY :   Family History  Problem Relation Age of Onset  . Breast cancer Mother 6  . Cancer Mother     Breast   . Breast cancer Paternal Aunt 52  . Cancer Maternal Aunt     Breast     SOCIAL HISTORY:   Social History  Substance Use Topics  . Smoking status: Former Smoker    Packs/day: 0.50    Years: 20.00    Types: Cigarettes    Quit date: 07/02/2012  . Smokeless tobacco: Never Used     Comment: quit 2014  . Alcohol use Yes     Comment: occ    ALLERGIES:  has No Known Allergies.  MEDICATIONS:  Current Outpatient Prescriptions  Medication Sig Dispense Refill  . albuterol (PROAIR HFA) 108 (90 BASE) MCG/ACT  inhaler Inhale 1-2 puffs into the lungs every 6 (six) hours as needed for wheezing or shortness of breath.     [name] amLODipine (NORVASC) 10 MG tablet Take 10 mg by mouth daily. for blood pressure  3  . budesonide-formoterol (SYMBICORT) 160-4.5 MCG/ACT inhaler Inhale 2 puffs into the lungs 2 (two) times daily.    . celecoxib (CELEBREX) 200 MG capsule Take 1 capsule (200 mg total) by mouth daily. 30 capsule 0  . citalopram (CELEXA) 20 MG tablet Take 20 mg by mouth daily.     [name] losartan-hydrochlorothiazide (HYZAAR) 100-25 MG tablet Take 1 tablet by mouth daily. 30 tablet 0  . meloxicam (MOBIC) 7.5 MG tablet     . potassium chloride SA (K-DUR,KLOR-CON) 20 MEQ tablet Take 1 tablet (20 mEq total) by mouth daily. 30 tablet 6  . tiotropium (SPIRIVA) 18 MCG inhalation capsule Place into inhaler and inhale.     No current facility-administered medications for this visit.     PHYSICAL EXAMINATION: ECOG PERFORMANCE STATUS: 0 - Asymptomatic  BP 112/77 (BP Location: Right Arm, Patient Position: Sitting)   Pulse (!) 109   Temp (!) 96.8 F (36 C) (Tympanic)   Resp 18   Ht '5\' 6"'$  (1.676 m)   Wt 144 lb 9.6 oz (65.6 kg)   BMI 23.34 kg/m   Filed Weights   07/25/16 1027  Weight: 144 lb 9.6 oz (65.6 kg)    GENERAL: Well-nourished well-developed; Alert, no distress and comfortable.   Alone.  EYES: no pallor or icterus OROPHARYNX: no thrush or ulceration; good dentition  NECK: supple, no masses felt LYMPH:  no palpable lymphadenopathy in the cervical, axillary or inguinal regions LUNGS: clear to auscultation and  No wheeze or crackles HEART/CVS: regular rate & rhythm and no murmurs; No lower extremity edema ABDOMEN:abdomen soft, non-tender and normal bowel sounds Musculoskeletal:no cyanosis of digits and no clubbing  PSYCH: alert & oriented x 3 with fluent speech NEURO: no focal motor/sensory deficits SKIN:  no rashes or significant lesions  LABORATORY DATA:  I have reviewed the data as listed     Component Value Date/Time   NA 139 06/22/2016 1421   NA 132 (L) 02/09/2015 1100   K 3.8 06/22/2016 1421   K 3.8 02/09/2015 1100   CL 105 06/22/2016 1421   CL 95 (L) 02/09/2015 1100   CO2 25  06/22/2016 1421   CO2 29 02/09/2015 1100   GLUCOSE 112 (H) 06/22/2016 1421   GLUCOSE 105 (H) 02/09/2015 1100   BUN 32 (H) 06/22/2016 1421   BUN 16 02/09/2015 1100   CREATININE 1.08 (H) 06/22/2016 1421   CREATININE 0.81 02/09/2015 1100   CALCIUM 9.5 06/22/2016 1421   CALCIUM 9.1 02/09/2015 1100   PROT 7.8 06/22/2016 1421   PROT 7.7 02/09/2015 1100   ALBUMIN 4.3 06/22/2016 1421   ALBUMIN 4.3 02/09/2015 1100   AST 35 06/22/2016 1421   AST 29 02/09/2015 1100   ALT 33 06/22/2016 1421   ALT 20 02/09/2015 1100   ALKPHOS 84 06/22/2016 1421   ALKPHOS 69 02/09/2015 1100   BILITOT 0.5 06/22/2016 1421   BILITOT 0.9 02/09/2015 1100   GFRNONAA 54 (L) 06/22/2016 1421   GFRNONAA >60 02/09/2015 1100   GFRAA >60 06/22/2016 1421   GFRAA >60 02/09/2015 1100    No results found for: SPEP, UPEP  Lab Results  Component Value Date   WBC 5.1 06/22/2016   NEUTROABS 2.5 06/22/2016   HGB 10.5 (L) 06/22/2016   HCT 31.8 (L) 06/22/2016   MCV 95.4 06/22/2016   PLT 261 06/22/2016      Chemistry      Component Value Date/Time   NA 139 06/22/2016 1421   NA 132 (L) 02/09/2015 1100   K 3.8 06/22/2016 1421   K 3.8 02/09/2015 1100   CL 105 06/22/2016 1421   CL 95 (L) 02/09/2015 1100   CO2 25 06/22/2016 1421   CO2 29 02/09/2015 1100   BUN 32 (H) 06/22/2016 1421   BUN 16 02/09/2015 1100   CREATININE 1.08 (H) 06/22/2016 1421   CREATININE 0.81 02/09/2015 1100      Component Value Date/Time   CALCIUM 9.5 06/22/2016 1421   CALCIUM 9.1 02/09/2015 1100   ALKPHOS 84 06/22/2016 1421   ALKPHOS 69 02/09/2015 1100   AST 35 06/22/2016 1421   AST 29 02/09/2015 1100   ALT 33 06/22/2016 1421   ALT 20 02/09/2015 1100   BILITOT 0.5 06/22/2016 1421   BILITOT 0.9 02/09/2015 1100    IMPRESSION: 1. The enlarging  irregular nodule involving the right middle and upper lobes is mildly hypermetabolic, supporting the likelihood of malignancy, probably adenocarcinoma. Tissue sampling recommended. 2. No other suspicious findings.  No evidence of metastatic disease.   Electronically Signed   By: Richardean Sale M.D.   On: 07/13/2016 14:33   RADIOGRAPHIC STUDIES: I have personally reviewed the radiological images as listed and agreed with the findings in the report. No results found.   ASSESSMENT & PLAN:  Primary cancer of right upper lobe of lung ([place]) # Right lung Lung nodules- bronchoscopy biopsy atypical cells; inconclusive for malignancy. PET scan- low level SUV uptake right middle lobe approximately 2 cm in size. We will review the imaging at the tumor conference; the patient will likely need a repeat tissue diagnosis. Pt will likely need SBRT again.   # breast ca- NED  # Anemia- hb-10.5; NOT iron deficient; monitor for now.   # right hip pain- PET scan negative for any bony uptake. Suspect arthritis. Defer to PCP regarding intra-articular injections.   # follow up in 4 weeks; will inform pt of plan post conference.   # I reviewed the blood work- with the patient in detail; also reviewed the imaging independently [as summarized above]; and with the patient in detail.    No orders of the defined types were placed in  this encounter.  All questions were answered. The patient knows to call the clinic with any problems, questions or concerns.      [name], MD 07/25/2016 11:00 AM

## 2016-07-26 ENCOUNTER — Telehealth: Payer: Self-pay | Admitting: *Deleted

## 2016-07-26 ENCOUNTER — Other Ambulatory Visit: Payer: Self-pay | Admitting: Internal Medicine

## 2016-07-26 DIAGNOSIS — C3411 Malignant neoplasm of upper lobe, right bronchus or lung: Secondary | ICD-10-CM

## 2016-07-26 NOTE — Telephone Encounter (Signed)
Contacted patient left voice mail to have patient contact cancer center to review plan of care.  Spoke with MD- orders entered for ct guided biopsy. Patient not able to obtain a navigational bronch. This was previously attempted and only a small sample was yielded.  Patient contacted back 1600.  Need for biopsy procedure explained to patient.  She gave verbal understanding of the plan. She states that if she can not be reached at her mobile # then she could be contacted via her Work # : [phone].  She thanked me for providing this information.

## 2016-07-27 ENCOUNTER — Other Ambulatory Visit: Payer: Self-pay | Admitting: Physician Assistant

## 2016-07-30 ENCOUNTER — Ambulatory Visit
Admission: RE | Admit: 2016-07-30 | Discharge: 2016-07-30 | Disposition: A | Discharge status: Home / Self Care | Payer: BLUE CROSS/BLUE SHIELD | Source: Ambulatory Visit | Attending: Internal Medicine | Admitting: Internal Medicine

## 2016-07-30 ENCOUNTER — Ambulatory Visit
Admission: RE | Admit: 2016-07-30 | Discharge: 2016-07-30 | Disposition: A | Discharge status: Home / Self Care | Payer: BLUE CROSS/BLUE SHIELD | Source: Ambulatory Visit | Attending: Interventional Radiology | Admitting: Interventional Radiology

## 2016-07-30 DIAGNOSIS — C342 Malignant neoplasm of middle lobe, bronchus or lung: Secondary | ICD-10-CM | POA: Diagnosis not present

## 2016-07-30 DIAGNOSIS — I10 Essential (primary) hypertension: Secondary | ICD-10-CM | POA: Diagnosis not present

## 2016-07-30 DIAGNOSIS — Z87891 Personal history of nicotine dependence: Secondary | ICD-10-CM | POA: Insufficient documentation

## 2016-07-30 DIAGNOSIS — Z9889 Other specified postprocedural states: Secondary | ICD-10-CM

## 2016-07-30 DIAGNOSIS — Z79899 Other long term (current) drug therapy: Secondary | ICD-10-CM | POA: Diagnosis not present

## 2016-07-30 DIAGNOSIS — C3411 Malignant neoplasm of upper lobe, right bronchus or lung: Secondary | ICD-10-CM | POA: Diagnosis present

## 2016-07-30 DIAGNOSIS — J449 Chronic obstructive pulmonary disease, unspecified: Secondary | ICD-10-CM | POA: Insufficient documentation

## 2016-07-30 DIAGNOSIS — C3491 Malignant neoplasm of unspecified part of right bronchus or lung: Secondary | ICD-10-CM | POA: Diagnosis present

## 2016-07-30 DIAGNOSIS — E119 Type 2 diabetes mellitus without complications: Secondary | ICD-10-CM | POA: Insufficient documentation

## 2016-07-30 LAB — CBC
HEMATOCRIT: 34.2 % — AB (ref 35.0–47.0)
HEMOGLOBIN: 11.6 g/dL — AB (ref 12.0–16.0)
MCH: 32.1 pg (ref 26.0–34.0)
MCHC: 34 g/dL (ref 32.0–36.0)
MCV: 94.6 fL (ref 80.0–100.0)
Platelets: 289 10*3/uL (ref 150–440)
RBC: 3.61 MIL/uL — ABNORMAL LOW (ref 3.80–5.20)
RDW: 13.2 % (ref 11.5–14.5)
WBC: 4.6 10*3/uL (ref 3.6–11.0)

## 2016-07-30 LAB — PROTIME-INR
INR: 0.9
Prothrombin Time: 12.1 seconds (ref 11.4–15.2)

## 2016-07-30 LAB — APTT: APTT: 32 s (ref 24–36)

## 2016-07-30 MED ORDER — FENTANYL CITRATE (PF) 100 MCG/2ML IJ SOLN
INTRAMUSCULAR | Status: AC | PRN
  Administered 2016-07-30: 25 ug via INTRAVENOUS

## 2016-07-30 MED ORDER — MIDAZOLAM HCL 5 MG/5ML IJ SOLN
INTRAMUSCULAR | Status: AC | PRN
  Administered 2016-07-30: 0.5 mg via INTRAVENOUS

## 2016-07-30 MED ORDER — FENTANYL CITRATE (PF) 100 MCG/2ML IJ SOLN
INTRAMUSCULAR | Status: AC
  Filled 2016-07-30: qty 4

## 2016-07-30 MED ORDER — SODIUM CHLORIDE 0.9 % IV SOLN
INTRAVENOUS | Status: DC
  Administered 2016-07-30: 20 mL/h via INTRAVENOUS

## 2016-07-30 MED ORDER — MIDAZOLAM HCL 5 MG/5ML IJ SOLN
INTRAMUSCULAR | Status: AC
  Filled 2016-07-30: qty 5

## 2016-07-30 NOTE — Consult Note (Signed)
Chief Complaint: Indeterminate enlarging hypermetabolic pulmonary nodule  Referring Physician(s): Brahmanday,Govinda R  Patient Status: [facility] - Out-pt  History of Present Illness: Danielle [name] is a 60 y.o. female with past medical history significant for breast and lung cancer, COPD, diabetes and hypertension who has been followed closely for a indeterminate groundglass nodular opacity within the anterior segment of the right upper and middle lobes, traversing the right minor fissure. This nodule has seen been found to slowly enlarge with subsequent PET/CT demonstrating mild low-level hypermetabolic activity. As such, patient presents today for CT-guided biopsy.  Patient remains without complaint in regards to this pulmonary nodule.  The patient reports no change in her baseline shortness of breath. Patient is on very infrequent home oxygen (typically requiring home oxygen less than once per month). No cough or hemoptysis. No fever or chills. No unintentional weight loss.    Past Medical History:  Diagnosis Date  . Anxiety   . Arthritis   . Breast cancer in female [facility]) 02/21/2016  . Cancer ([place]) left    breast cancer 2000  . COPD (chronic obstructive pulmonary disease) ([place])   . Diabetes mellitus without complication ([place])   . Heart murmur   . Hypertension   . Lung nodule   . Shortness of breath dyspnea    with exertion  . Status post chemotherapy 2001   left breast cancer  . Status post radiation therapy 2001   left breast cancer    Past Surgical History:  Procedure Laterality Date  . Breast Biospy Left    [facility]  . BREAST SURGERY    . DILATION AND CURETTAGE OF UTERUS    . ELECTROMAGNETIC NAVIGATION BROCHOSCOPY Right 04/11/2016   Procedure: ELECTROMAGNETIC NAVIGATION BRONCHOSCOPY;  Surgeon: Vilinda Boehringer, MD;  Location: [facility] ORS;  Service: Cardiopulmonary;  Laterality: Right;  . LUNG BIOPSY    . MASTECTOMY Left    2000, [facility]  . ROTATOR CUFF REPAIR Right    [facility]     Allergies: Review of patient's allergies indicates no known allergies.  Medications: Prior to Admission medications   Medication Sig Start Date End Date Taking? Authorizing Provider  albuterol (PROAIR HFA) 108 (90 BASE) MCG/ACT inhaler Inhale 1-2 puffs into the lungs every 6 (six) hours as needed for wheezing or shortness of breath.  06/28/15  Yes Historical Provider, MD  amLODipine (NORVASC) 10 MG tablet Take 10 mg by mouth daily. for blood pressure 05/21/16  Yes Historical Provider, MD  budesonide-formoterol (SYMBICORT) 160-4.5 MCG/ACT inhaler Inhale 2 puffs into the lungs 2 (two) times daily.   Yes Historical Provider, MD  celecoxib (CELEBREX) 200 MG capsule Take 1 capsule (200 mg total) by mouth daily. 07/23/15  Yes Jenise V Bacon Menshew, PA-C  citalopram (CELEXA) 20 MG tablet Take 20 mg by mouth daily.    Yes Historical Provider, MD  losartan-hydrochlorothiazide (HYZAAR) 100-25 MG tablet Take 1 tablet by mouth daily. 09/28/15  Yes Forest Gleason, MD  meloxicam (MOBIC) 7.5 MG tablet  07/04/16  Yes Historical Provider, MD  potassium chloride SA (K-DUR,KLOR-CON) 20 MEQ tablet Take 1 tablet (20 mEq total) by mouth daily. 11/14/15  Yes Forest Gleason, MD  tiotropium (SPIRIVA) 18 MCG inhalation capsule Place into inhaler and inhale. 06/28/15  Yes Historical Provider, MD     Family History  Problem Relation Age of Onset  . Breast cancer Mother 4  . Cancer Mother     Breast   . Breast cancer Paternal Aunt 21  . Cancer Maternal Aunt  Breast     Social History   Social History  . Marital status: Divorced    Spouse name: N/A  . Number of children: N/A  . Years of education: N/A   Social History Main Topics  . Smoking status: Former Smoker    Packs/day: 0.50    Years: 20.00    Types: Cigarettes    Quit date: 07/02/2012  . Smokeless tobacco: Never Used     Comment: quit 2014  . Alcohol use Yes     Comment: occ  . Drug use: No  . Sexual activity: No   Other Topics Concern  .  None   Social History Narrative  . None    ECOG Status: 0 - Asymptomatic  Review of Systems: A 12 point ROS discussed and pertinent positives are indicated in the HPI above.  All other systems are negative.  Review of Systems  Constitutional: Negative for activity change, appetite change, fatigue, fever and unexpected weight change.  Respiratory: Negative.   Cardiovascular: Negative.   Gastrointestinal: Negative.     Vital Signs: BP (!) 146/88   Pulse 83   Temp 98.2 F (36.8 C) (Oral)   Resp 14   SpO2 94%   Physical Exam  Constitutional: She appears well-developed and well-nourished.  HENT:  Head: Normocephalic and atraumatic.  Cardiovascular: Normal rate.   Murmur heard. Holosystolic murmur.  Pulmonary/Chest: Effort normal.  Abdominal: Soft.  Psychiatric: She has a normal mood and affect. Her behavior is normal.  Nursing note and vitals reviewed.   Imaging: Nm Pet Image Restag (ps) Skull Base To Thigh  Result Date: 07/13/2016 CLINICAL DATA:  Subsequent treatment strategy for right lung squamous cell carcinoma diagnosed in 2015. History of left breast cancer. Enlarging right lung sub solid lesion on CT. EXAM: NUCLEAR MEDICINE PET SKULL BASE TO THIGH TECHNIQUE: 12.18 mCi F-18 FDG was injected intravenously. Full-ring PET imaging was performed from the skull base to thigh after the radiotracer. CT data was obtained and used for attenuation correction and anatomic localization. FASTING BLOOD GLUCOSE:  Value: 78 mg/dl COMPARISON:  PET-CT 08/15/2015.  Chest CT 11/10/2015 and 02/28/2016. FINDINGS: NECK No hypermetabolic cervical lymph nodes are identified.There are no lesions of the pharyngeal mucosal space. CHEST There are no hypermetabolic mediastinal, hilar or axillary lymph nodes. The sub solid right middle lobe lesion is mildly hypermetabolic with an SUV max of 2.6. Better seen on recent diagnostic CT is extension across the minor fissure, into the upper lobe. This lesion  measures approximately 2.2 x 1.7 cm on image 79. The right upper lobe scarring appears stable without hypermetabolic activity. Postsurgical changes from previous left mastectomy and axillary node dissection are noted without abnormal metabolic activity. There is extensive atherosclerosis of the aorta, great vessels and coronary arteries. ABDOMEN/PELVIS There is no hypermetabolic activity within the liver, adrenal glands, spleen or pancreas. There is no hypermetabolic nodal activity. Hepatic steatosis, probable small hepatic cysts and diffuse aortic and branch vessel atherosclerosis noted. SKELETON There is no hypermetabolic activity to suggest osseous metastatic disease. IMPRESSION: 1. The enlarging irregular nodule involving the right middle and upper lobes is mildly hypermetabolic, supporting the likelihood of malignancy, probably adenocarcinoma. Tissue sampling recommended. 2. No other suspicious findings.  No evidence of metastatic disease. Electronically Signed   By: Richardean Sale M.D.   On: 07/13/2016 14:33    Labs:  CBC:  Recent Labs  02/21/16 1048 04/03/16 1207 06/22/16 1421 07/30/16 1019  WBC 6.5 5.2 5.1 4.6  HGB 12.3 12.3 10.5*  11.6*  HCT 36.4 36.8 31.8* 34.2*  PLT 275 293 261 289    COAGS:  Recent Labs  04/03/16 1207 07/30/16 1019  INR 1.10 0.90  APTT  --  32    BMP:  Recent Labs  11/08/15 0918 02/21/16 1048 04/03/16 1207 06/22/16 1421  NA 135 135 136 139  K 3.2* 3.6 4.4 3.8  CL 99* 97* 100* 105  CO2 '27 27 28 25  '$ GLUCOSE 107* 110* 88 112*  BUN '14 20 15 '$ 32*  CALCIUM 9.5 9.4 10.3 9.5  CREATININE 0.81 0.84 0.85 1.08*  GFRNONAA >60 >60 >60 54*  GFRAA >60 >60 >60 >60    LIVER FUNCTION TESTS:  Recent Labs  08/11/15 1521 11/08/15 0918 02/21/16 1048 06/22/16 1421  BILITOT 0.6 0.9 0.8 0.5  AST 27 29 46* 35  ALT 19 20 32 33  ALKPHOS 80 72 80 84  PROT 7.9 8.0 8.6* 7.8  ALBUMIN 4.4 4.3 4.5 4.3    TUMOR MARKERS: No results for input(s): AFPTM, CEA,  CA199, CHROMGRNA in the last 8760 hours.  Assessment and Plan:  TIKISHA MOLINARO is a 60 y.o. female with past medical history significant for breast and lung cancer, COPD, diabetes and hypertension who has been followed closely for an indeterminate groundglass nodular opacity within the anterior segment of the confluence of the right upper and middle lobes, traversing the right minor fissure. This nodule has seen been found to slowly enlarge with subsequent PET/CT demonstrating mild low-level hypermetabolic activity.  As such, patient presents today for CT-guided biopsy.  Risks and Benefits CT-guided lung nodule biopsy were discussed with the patient and the patient's daughter including, but not limited to bleeding, hemoptysis, respiratory failure requiring intubation, infection, pneumothorax requiring chest tube placement, stroke from air embolism or even death.  All of the patient's questions were answered, patient is agreeable to proceed.  Consent signed and in chart.  Thank you for this interesting consult.  I greatly enjoyed meeting Danielle [name] and look forward to participating in their care.  A copy of this report was sent to the requesting provider on this date.  Electronically Signed: Sandi Mariscal 07/30/2016, 11:23 AM   I spent a total of 15 Minutes in face to face in clinical consultation, greater than 50% of which was counseling/coordinating care for CT-guided lung nodule biopsy

## 2016-07-30 NOTE — Discharge Instructions (Signed)
Needle Biopsy of Lung, Care After Refer to this sheet in the next few weeks. These instructions provide you with information on caring for yourself after your procedure. Your health care provider may also give you more specific instructions. Your treatment has been planned according to current medical practices, but problems sometimes occur. Call your health care provider if you have any problems or questions after your procedure. WHAT TO EXPECT AFTER THE PROCEDURE  A bandage will be applied over the area where the needle was inserted. You may be asked to apply pressure to the bandage for several minutes to ensure there is minimal bleeding.  In most cases, you can leave when your needle biopsy procedure is completed. Do not drive yourself home. Someone else should take you home.  If you received an IV sedative or general anesthetic, you will be taken to a comfortable place to relax while the medicine wears off.  If you have upcoming travel scheduled, talk to your health care provider about when it is safe to travel by air after the procedure. HOME CARE INSTRUCTIONS  Expect to take it easy for the rest of the day.  Protect the area where you received the needle biopsy by keeping the bandage in place for as long as instructed.  You may feel some mild pain or discomfort in the area, but this should stop in a day or two.  Take medicines only as directed by your health care provider. SEEK MEDICAL CARE IF:   You have pain at the biopsy site that worsens or is not helped by medicine.  You have swelling or drainage at the needle biopsy site.  You have a fever. SEEK IMMEDIATE MEDICAL CARE IF:   You have new or worsening shortness of breath.  You have chest pain.  You are coughing up blood.  You have bleeding that does not stop with pressure or a bandage.  You develop light-headedness or fainting.   This information is not intended to replace advice given to you by your health care  provider. Make sure you discuss any questions you have with your health care provider.   Document Released: 07/29/2007 Document Revised: 10/22/2014 Document Reviewed: 02/23/2013 Elsevier Interactive Patient Education 2016 [place] Released: 12/20/2004 Document Revised: 10/22/2014 Document Reviewed: 03/15/2013 Elsevier Interactive Patient Education [employer].

## 2016-07-30 NOTE — Procedures (Signed)
Technically successful CT guided biopsy of hypermetabolic nodule within the anterior aspect of the right upper and middle lobes.  EBL: None.  No immediate post procedural complications.   Ronny Bacon, MD Pager #: [phone]

## 2016-08-13 IMAGING — MG MM DIGITAL SCREENING UNILAT*R* W/ CAD
3 series · 3 of 3 positions shown · non-contrast
Comparison: Previous exam(s).

CLINICAL DATA: Screening.

EXAM:
DIGITAL SCREENING UNILATERAL RIGHT MAMMOGRAM WITH CAD

[R MLO (1 of 2)]
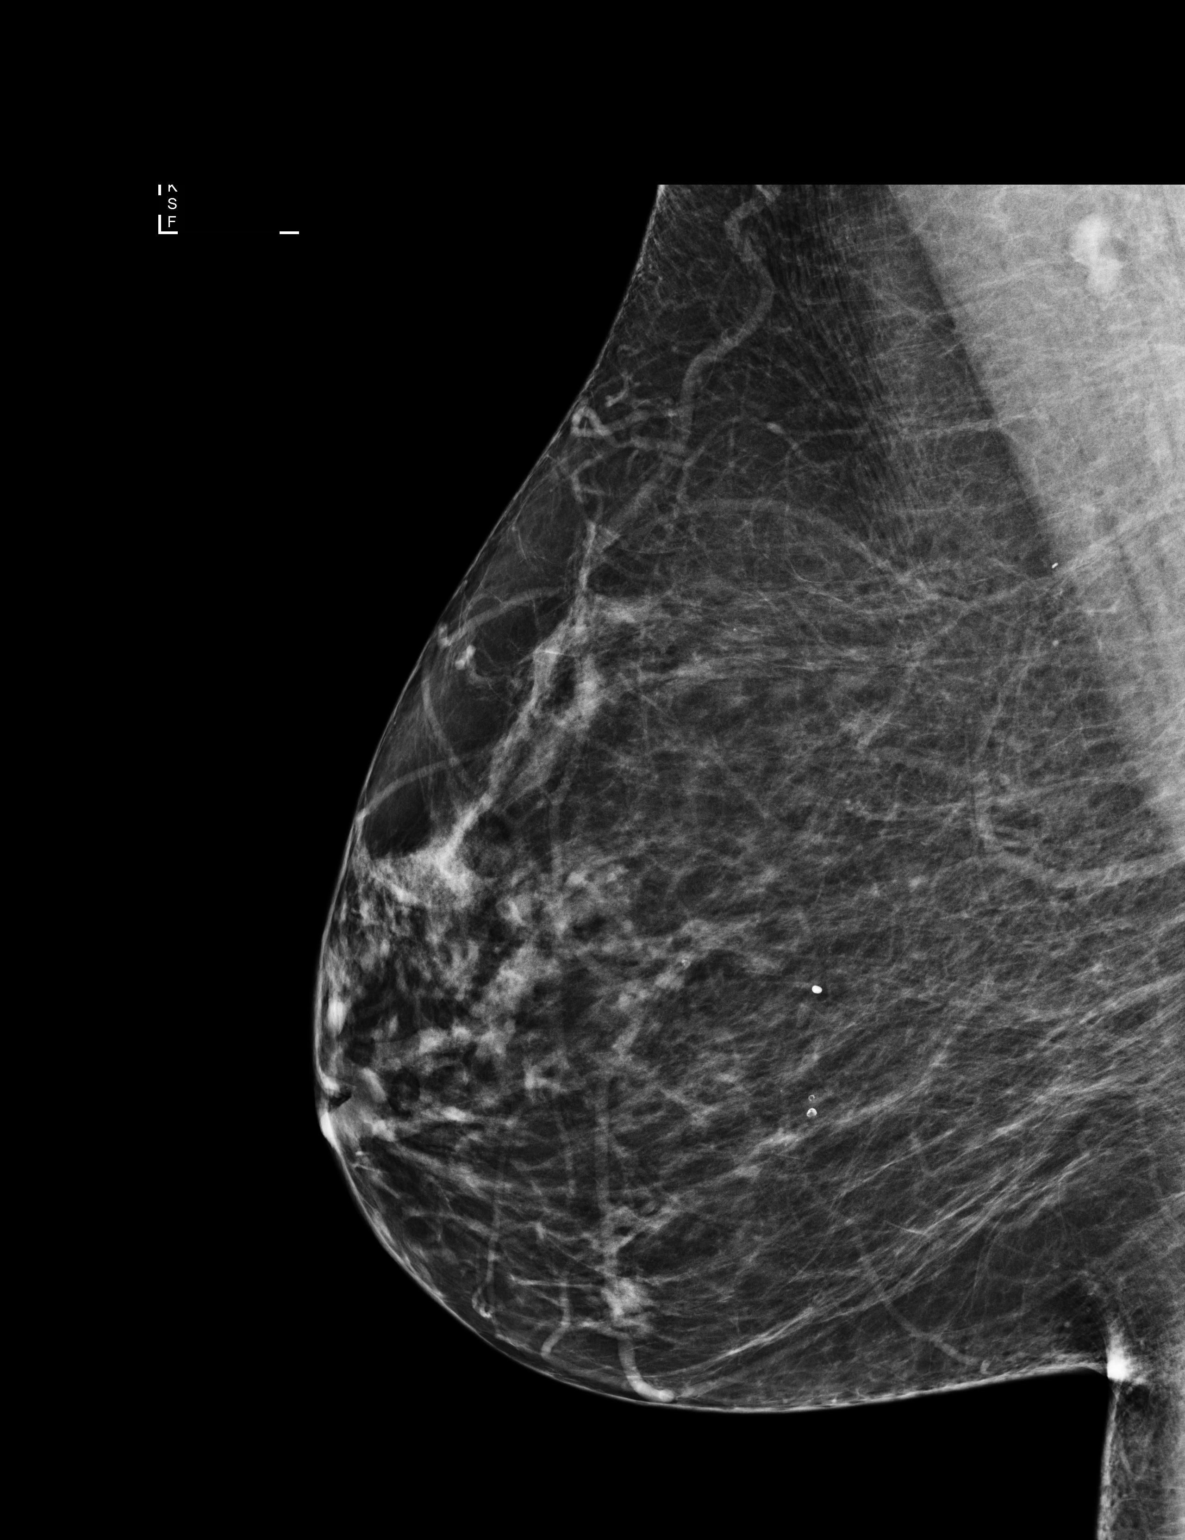

[R MLO (2 of 2)]
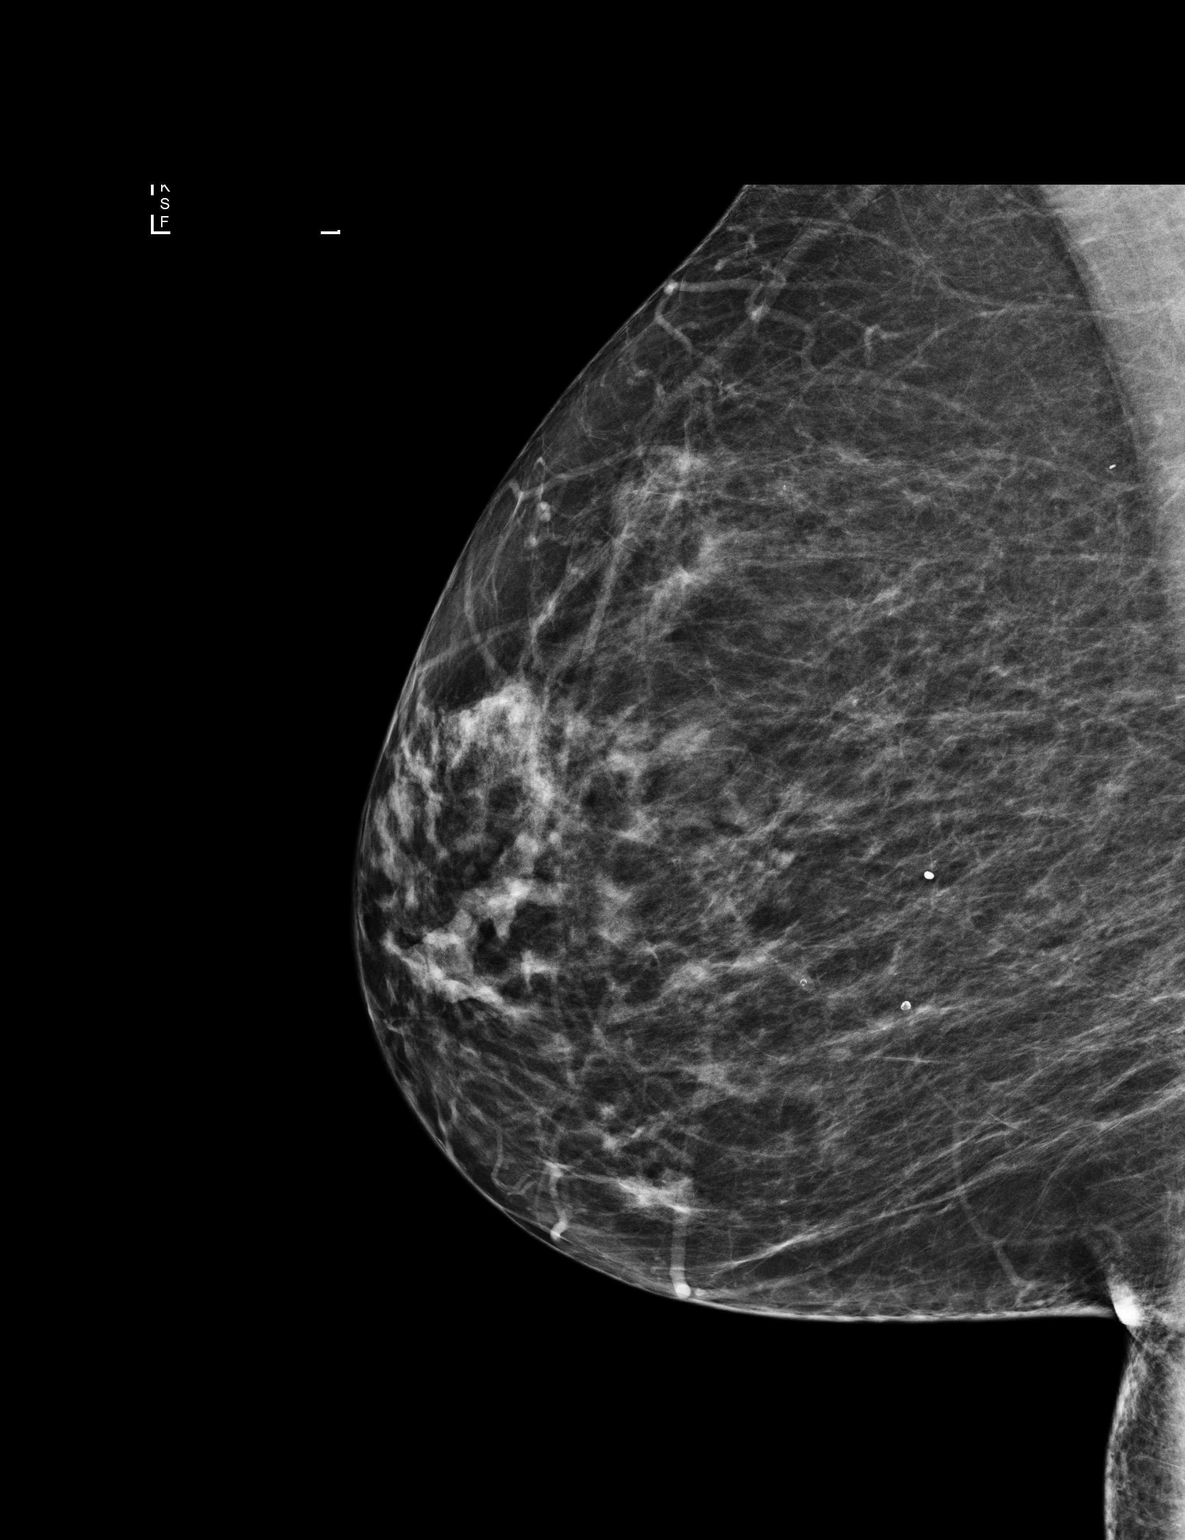

[R CC]
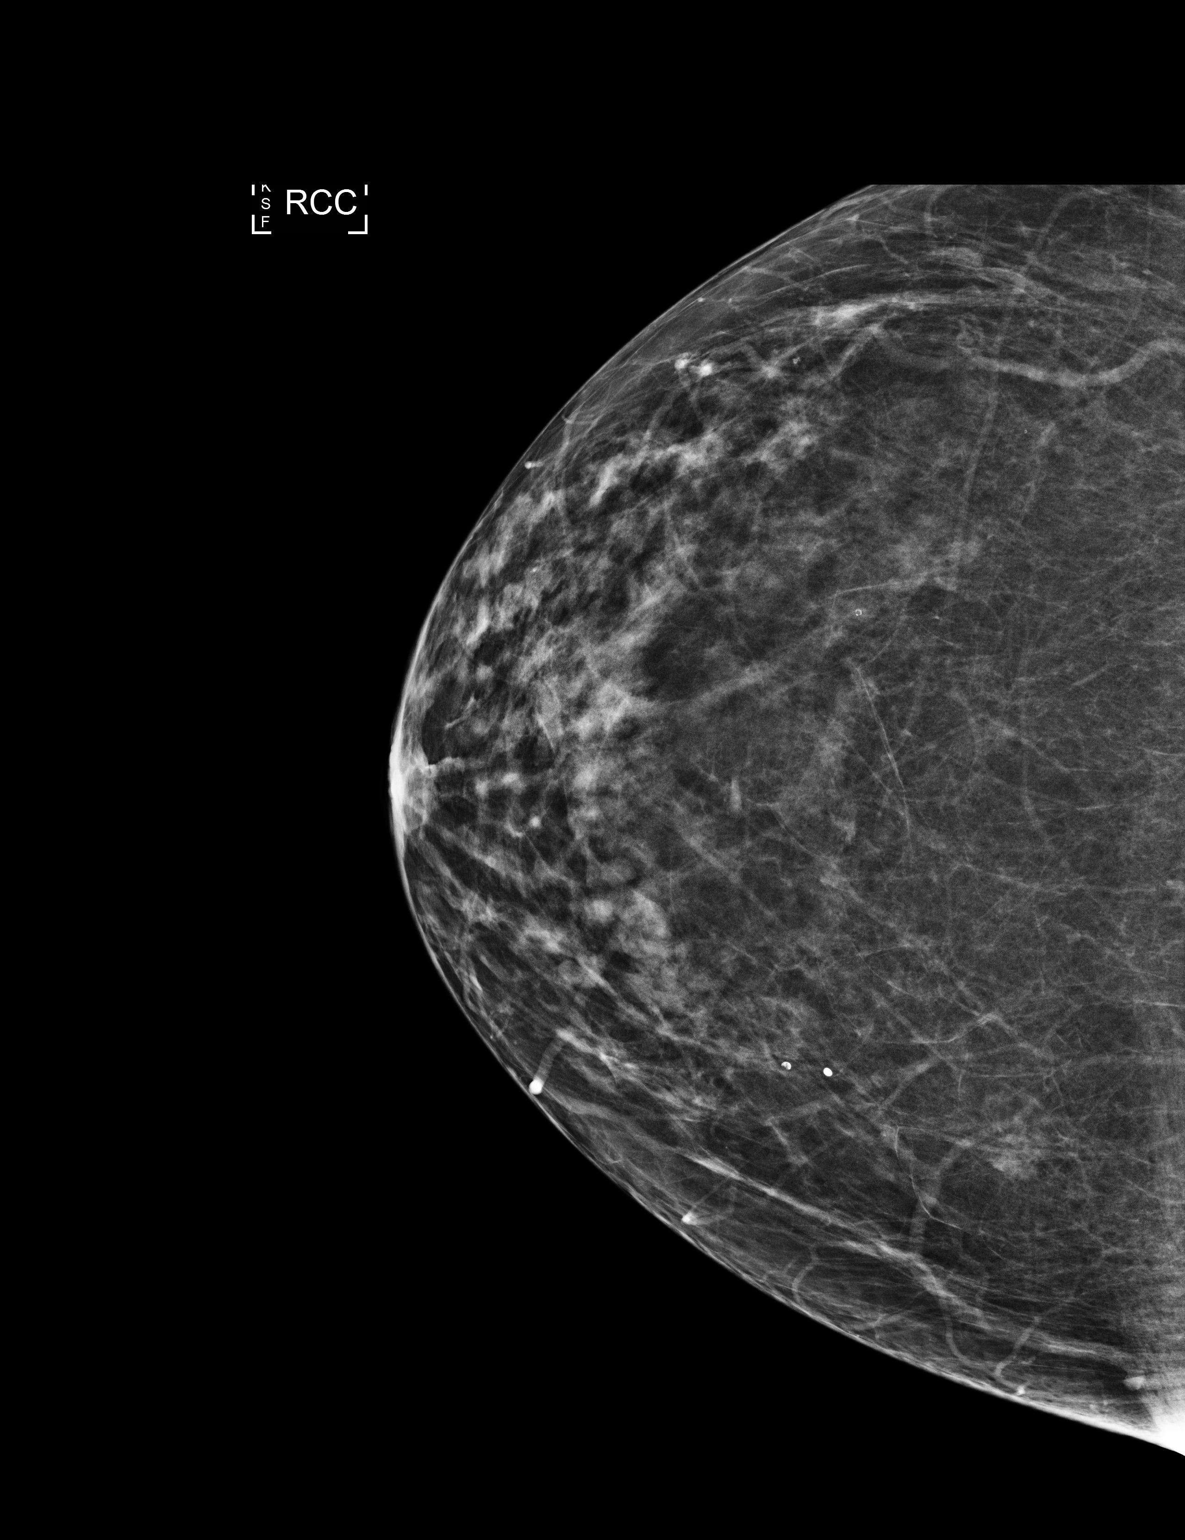

[3 of 3 positions shown; findings below may reference images not displayed]

ACR Breast Density Category c: The breast tissue is heterogeneously
dense, which may obscure small masses.
FINDINGS: There are no findings suspicious for malignancy. Images were
processed with CAD.
IMPRESSION: No mammographic evidence of malignancy. A result letter of this
screening mammogram will be mailed directly to the patient.

RECOMMENDATION:
Screening mammogram in one year. (Code:DI-L-3BK)

BI-RADS CATEGORY  1: Negative.

## 2016-08-22 ENCOUNTER — Inpatient Hospital Stay: Payer: BLUE CROSS/BLUE SHIELD | Attending: Internal Medicine | Admitting: Internal Medicine

## 2016-08-22 DIAGNOSIS — Z923 Personal history of irradiation: Secondary | ICD-10-CM | POA: Insufficient documentation

## 2016-08-22 DIAGNOSIS — E119 Type 2 diabetes mellitus without complications: Secondary | ICD-10-CM | POA: Diagnosis not present

## 2016-08-22 DIAGNOSIS — Z79899 Other long term (current) drug therapy: Secondary | ICD-10-CM | POA: Diagnosis not present

## 2016-08-22 DIAGNOSIS — R011 Cardiac murmur, unspecified: Secondary | ICD-10-CM | POA: Diagnosis not present

## 2016-08-22 DIAGNOSIS — Z853 Personal history of malignant neoplasm of breast: Secondary | ICD-10-CM | POA: Insufficient documentation

## 2016-08-22 DIAGNOSIS — R918 Other nonspecific abnormal finding of lung field: Secondary | ICD-10-CM | POA: Insufficient documentation

## 2016-08-22 DIAGNOSIS — M129 Arthropathy, unspecified: Secondary | ICD-10-CM | POA: Diagnosis not present

## 2016-08-22 DIAGNOSIS — J449 Chronic obstructive pulmonary disease, unspecified: Secondary | ICD-10-CM

## 2016-08-22 DIAGNOSIS — C3411 Malignant neoplasm of upper lobe, right bronchus or lung: Secondary | ICD-10-CM | POA: Diagnosis not present

## 2016-08-22 DIAGNOSIS — D649 Anemia, unspecified: Secondary | ICD-10-CM | POA: Insufficient documentation

## 2016-08-22 DIAGNOSIS — Z87891 Personal history of nicotine dependence: Secondary | ICD-10-CM | POA: Diagnosis not present

## 2016-08-22 DIAGNOSIS — I10 Essential (primary) hypertension: Secondary | ICD-10-CM | POA: Insufficient documentation

## 2016-08-22 DIAGNOSIS — F419 Anxiety disorder, unspecified: Secondary | ICD-10-CM | POA: Insufficient documentation

## 2016-08-22 DIAGNOSIS — R05 Cough: Secondary | ICD-10-CM | POA: Insufficient documentation

## 2016-08-22 DIAGNOSIS — Z803 Family history of malignant neoplasm of breast: Secondary | ICD-10-CM | POA: Insufficient documentation

## 2016-08-22 DIAGNOSIS — Z9221 Personal history of antineoplastic chemotherapy: Secondary | ICD-10-CM

## 2016-08-22 DIAGNOSIS — R0602 Shortness of breath: Secondary | ICD-10-CM | POA: Insufficient documentation

## 2016-08-22 DIAGNOSIS — Z9012 Acquired absence of left breast and nipple: Secondary | ICD-10-CM | POA: Diagnosis not present

## 2016-08-22 NOTE — Progress Notes (Signed)
[name] OFFICE PROGRESS NOTE  Patient Care Team: [facility] as PCP - General  Breast cancer in female [facility])   Staging form: Breast, AJCC 7th Edition   - Clinical: Stage IIIA (T3, N1, M0) - Signed by [name], NP on 02/21/2016  Lung cancer, upper lobe ([place])   Staging form: Lung, AJCC 7th Edition   - Clinical stage from 10/23/2014: T1, N0, M0 - Signed by [name], NP on 02/21/2016   Oncology History   # 2016- JAN RUL 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 A. LUNG MASS, RIGHT; CT-GUIDED BIOPSY: - ADENOCARCINOMA, ACINAR AND PAPILLARY MORPHOLOGIES.  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)   07/24/1999 Initial Diagnosis    Breast cancer in female ([place]) T3 N1 M0 tumor ER/PR positive      08/24/1999 -  Anti-estrogen oral therapy    Started tamoxifen      08/24/1999 Surgery    Status post modified radical mastectomy of left breast       Chemotherapy          Radiation Therapy         11/24/2003 - 02/20/2009 Anti-estrogen oral therapy    Started Aromasin       Lung cancer, upper lobe ([place])   08/23/2014 Initial Diagnosis    Lung cancer, upper lobe of right lung ([place]) T1 N0 M0       Carcinoma of overlapping sites of left breast in female, estrogen receptor positive ([place])   06/22/2016 Initial Diagnosis    Cancer of overlapping sites of left female breast [facility])      Primary cancer of right upper lobe of lung ([place])   07/25/2016 Initial Diagnosis    Primary cancer of right upper lobe of lung ([place])       INTERVAL HISTORY:  Danielle [name] 60 y.o.  female pleasant patient above history ofBreast cancer; Stage I squamous cell lung cancer; and also history of right middle  lobe lung nodule Status post biopsy is here for follow-up.  Patient has chronic shortness of breath. Chronic cough or any worse. No hemoptysis. Denies any chest pain. Denies any lumps bumps. No weight loss.  She is concerned about working at her job during the treatments if needed.  REVIEW OF SYSTEMS:  A complete 10 point review of system is done which is negative except mentioned above/history of present illness.   PAST MEDICAL HISTORY :  Past Medical History:  Diagnosis Date  . Anxiety   . Arthritis   . Breast cancer in female [facility]) 02/21/2016  . Cancer ([place]) left    breast cancer 2000  . COPD (chronic obstructive pulmonary disease) ([place])   . Diabetes mellitus without complication ([place])   . Heart murmur   . Hypertension   . Lung nodule   . Shortness of breath dyspnea    with exertion  . Status post chemotherapy 2001   left breast cancer  . Status post radiation therapy 2001   left breast cancer    PAST SURGICAL HISTORY :   Past Surgical History:  Procedure Laterality Date  . Breast Biospy Left  [place]    . DILATION AND CURETTAGE OF UTERUS    . ELECTROMAGNETIC NAVIGATION BROCHOSCOPY Right 04/11/2016   Procedure: ELECTROMAGNETIC NAVIGATION BRONCHOSCOPY;  Surgeon: Vilinda Boehringer, MD;  Location: [facility] ORS;  Service: Cardiopulmonary;  Laterality: Right;  . LUNG BIOPSY    . MASTECTOMY Left    2000, [facility]  . ROTATOR CUFF REPAIR Right    [facility]    FAMILY HISTORY :   Family History  Problem Relation Age of Onset  . Breast cancer Mother 58  . Cancer Mother     Breast   . Breast cancer Paternal Aunt 56  . Cancer Maternal Aunt     Breast     SOCIAL HISTORY:   Social History  Substance Use Topics  . Smoking status: Former Smoker    Packs/day: 0.50    Years: 20.00    Types: Cigarettes    Quit date: 07/02/2012  . Smokeless tobacco: Never Used     Comment: quit 2014  . Alcohol use Yes     Comment: occ    ALLERGIES:  has No Known  Allergies.  MEDICATIONS:  Current Outpatient Prescriptions  Medication Sig Dispense Refill  . albuterol (PROAIR HFA) 108 (90 BASE) MCG/ACT inhaler Inhale 1-2 puffs into the lungs every 6 (six) hours as needed for wheezing or shortness of breath.     [name] amLODipine (NORVASC) 10 MG tablet Take 10 mg by mouth daily. for blood pressure  3  . budesonide-formoterol (SYMBICORT) 160-4.5 MCG/ACT inhaler Inhale 2 puffs into the lungs 2 (two) times daily.    . celecoxib (CELEBREX) 200 MG capsule Take 1 capsule (200 mg total) by mouth daily. 30 capsule 0  . citalopram (CELEXA) 20 MG tablet Take 20 mg by mouth daily.     [name] losartan-hydrochlorothiazide (HYZAAR) 100-25 MG tablet Take 1 tablet by mouth daily. 30 tablet 0  . meloxicam (MOBIC) 7.5 MG tablet     . potassium chloride SA (K-DUR,KLOR-CON) 20 MEQ tablet Take 1 tablet (20 mEq total) by mouth daily. 30 tablet 6  . tiotropium (SPIRIVA) 18 MCG inhalation capsule Place into inhaler and inhale.     No current facility-administered medications for this visit.     PHYSICAL EXAMINATION: ECOG PERFORMANCE STATUS: 0 - Asymptomatic  BP (!) 169/88 (BP Location: Right Arm, Patient Position: Sitting)   Pulse 71   Temp 97 F (36.1 C) (Tympanic)   Resp 14   Wt 146 lb 3.2 oz (66.3 kg)   SpO2 97%   BMI 23.60 kg/m   Filed Weights   08/22/16 1013  Weight: 146 lb 3.2 oz (66.3 kg)    GENERAL: Well-nourished well-developed; Alert, no distress and comfortable.   Alone.  EYES: no pallor or icterus OROPHARYNX: no thrush or ulceration; good dentition  NECK: supple, no masses felt LYMPH:  no palpable lymphadenopathy in the cervical, axillary or inguinal regions LUNGS: clear to auscultation and  No wheeze or crackles HEART/CVS: regular rate & rhythm and no murmurs; No lower extremity edema ABDOMEN:abdomen soft, non-tender and normal bowel sounds Musculoskeletal:no cyanosis of digits and no clubbing  PSYCH: alert & oriented x 3 with fluent speech NEURO: no  focal motor/sensory deficits SKIN:  no rashes or significant lesions  LABORATORY DATA:  I have reviewed the data as listed    Component Value Date/Time   NA 139 06/22/2016 1421   NA 132 (L) 02/09/2015 1100   K 3.8 06/22/2016 1421   K 3.8 02/09/2015 1100   CL  105 06/22/2016 1421   CL 95 (L) 02/09/2015 1100   CO2 25 06/22/2016 1421   CO2 29 02/09/2015 1100   GLUCOSE 112 (H) 06/22/2016 1421   GLUCOSE 105 (H) 02/09/2015 1100   BUN 32 (H) 06/22/2016 1421   BUN 16 02/09/2015 1100   CREATININE 1.08 (H) 06/22/2016 1421   CREATININE 0.81 02/09/2015 1100   CALCIUM 9.5 06/22/2016 1421   CALCIUM 9.1 02/09/2015 1100   PROT 7.8 06/22/2016 1421   PROT 7.7 02/09/2015 1100   ALBUMIN 4.3 06/22/2016 1421   ALBUMIN 4.3 02/09/2015 1100   AST 35 06/22/2016 1421   AST 29 02/09/2015 1100   ALT 33 06/22/2016 1421   ALT 20 02/09/2015 1100   ALKPHOS 84 06/22/2016 1421   ALKPHOS 69 02/09/2015 1100   BILITOT 0.5 06/22/2016 1421   BILITOT 0.9 02/09/2015 1100   GFRNONAA 54 (L) 06/22/2016 1421   GFRNONAA >60 02/09/2015 1100   GFRAA >60 06/22/2016 1421   GFRAA >60 02/09/2015 1100    No results found for: SPEP, UPEP  Lab Results  Component Value Date   WBC 4.6 07/30/2016   NEUTROABS 2.5 06/22/2016   HGB 11.6 (L) 07/30/2016   HCT 34.2 (L) 07/30/2016   MCV 94.6 07/30/2016   PLT 289 07/30/2016      Chemistry      Component Value Date/Time   NA 139 06/22/2016 1421   NA 132 (L) 02/09/2015 1100   K 3.8 06/22/2016 1421   K 3.8 02/09/2015 1100   CL 105 06/22/2016 1421   CL 95 (L) 02/09/2015 1100   CO2 25 06/22/2016 1421   CO2 29 02/09/2015 1100   BUN 32 (H) 06/22/2016 1421   BUN 16 02/09/2015 1100   CREATININE 1.08 (H) 06/22/2016 1421   CREATININE 0.81 02/09/2015 1100      Component Value Date/Time   CALCIUM 9.5 06/22/2016 1421   CALCIUM 9.1 02/09/2015 1100   ALKPHOS 84 06/22/2016 1421   ALKPHOS 69 02/09/2015 1100   AST 35 06/22/2016 1421   AST 29 02/09/2015 1100   ALT 33 06/22/2016  1421   ALT 20 02/09/2015 1100   BILITOT 0.5 06/22/2016 1421   BILITOT 0.9 02/09/2015 1100    IMPRESSION: 1. The enlarging irregular nodule involving the right middle and upper lobes is mildly hypermetabolic, supporting the likelihood of malignancy, probably adenocarcinoma. Tissue sampling recommended. 2. No other suspicious findings.  No evidence of metastatic disease.   Electronically Signed   By: Richardean Sale M.D.   On: 07/13/2016 14:33   RADIOGRAPHIC STUDIES: I have personally reviewed the radiological images as listed and agreed with the findings in the report. No results found.   ASSESSMENT & PLAN:  Primary cancer of right upper lobe of lung ([place]) # Right lung Lung nodules ~ 2 cm in size; s/p Bx- proven adenocarcinoma. Check molecule markers. Discussed with Dr. Donella Stade evaluated for possible radiation again.   # If patient is not a candidate for any radiation for any reason- discussed regarding the use of immunotherapy versus chemotherapy.   # breast ca- NED  # Anemia- hb-10.5; NOT iron deficient; monitor for now.   # Follow-up with me. Days after the appointment with radiation oncology; this patient not getting radiation- we will plan to systemic therapy; no labs. Pt agrees with the plan.   No orders of the defined types were placed in this encounter.  All questions were answered. The patient knows to call the clinic with any problems, questions or concerns.  [name], MD 08/22/2016 12:21 PM

## 2016-08-22 NOTE — Assessment & Plan Note (Addendum)
#   Right lung Lung nodules ~ 2 cm in size; s/p Bx- proven adenocarcinoma. Check molecule markers. Discussed with Dr. Donella Stade evaluated for possible radiation again.   # If patient is not a candidate for any radiation for any reason- discussed regarding the use of immunotherapy versus chemotherapy. She will likely need time off work if systemic therapy is apparent especially chemotherapy.  # breast ca- NED  # Anemia- hb-10.5; NOT iron deficient; monitor for now.   # Follow-up with me. Days after the appointment with radiation oncology; this patient not getting radiation- we will plan to systemic therapy; no labs. Pt agrees with the plan.

## 2016-08-22 NOTE — Progress Notes (Signed)
Patient is here for follow up, she has cramping in her hands, she has trouble sleeping. She is asking about her long term disability, would we help take care of this since its cancer related.  Needs note to have the rest of the day off.

## 2016-08-24 ENCOUNTER — Ambulatory Visit
Admission: RE | Admit: 2016-08-24 | Discharge: 2016-08-24 | Disposition: A | Discharge status: Home / Self Care | Payer: BLUE CROSS/BLUE SHIELD | Source: Ambulatory Visit | Attending: Radiation Oncology | Admitting: Radiation Oncology

## 2016-08-24 ENCOUNTER — Encounter: Payer: Self-pay | Admitting: *Deleted

## 2016-08-24 ENCOUNTER — Encounter: Payer: Self-pay | Admitting: Radiation Oncology

## 2016-08-24 VITALS — BP 157/84 | HR 84 | Temp 97.2°F | Wt 148.0 lb

## 2016-08-24 DIAGNOSIS — Z87891 Personal history of nicotine dependence: Secondary | ICD-10-CM | POA: Diagnosis not present

## 2016-08-24 DIAGNOSIS — Z510 Encounter for antineoplastic radiation therapy: Secondary | ICD-10-CM | POA: Insufficient documentation

## 2016-08-24 DIAGNOSIS — Z923 Personal history of irradiation: Secondary | ICD-10-CM | POA: Diagnosis not present

## 2016-08-24 DIAGNOSIS — C342 Malignant neoplasm of middle lobe, bronchus or lung: Secondary | ICD-10-CM | POA: Insufficient documentation

## 2016-08-24 DIAGNOSIS — Z85118 Personal history of other malignant neoplasm of bronchus and lung: Secondary | ICD-10-CM | POA: Insufficient documentation

## 2016-08-24 DIAGNOSIS — Z853 Personal history of malignant neoplasm of breast: Secondary | ICD-10-CM | POA: Diagnosis not present

## 2016-08-24 DIAGNOSIS — C3411 Malignant neoplasm of upper lobe, right bronchus or lung: Secondary | ICD-10-CM

## 2016-08-24 NOTE — Progress Notes (Signed)
Radiation Oncology Follow up Note  Name: Danielle [name]   Date:   08/24/2016 MRN:  [id number] DOB: December 13, 1955    This 60 y.o. female presents to the clinic today for old patient new area for second right upper lobe non-small cell lung cancer for SB RT.  REFERRING PROVIDER: [facility], [place]  HPI: Patient is a. 60 year old female well known to our department. She was initially treated back in 2003 for a T3 N1 ER/PR positive invasive mammary carcinoma of the left breast status post radical mastectomy and adjuvant chemotherapy and chest wall and peripheral lymphatic radiation. She is also status post SB RT to a right upper lobe T1 squamous cell carcinoma which has completely resolved. She's been followed by a second nodule in the right middle lobe initial Danielle [name] showed atypical cells and PET scan in September showed low SUV. She underwent CT-guided biopsy back in October recently showing adenocarcinoma with acinar and papillary morphologies. She is asymptomatic specifically denies cough hemoptysis or chest tightness. Has very little shortness of breath. I been asked to evaluate her for possibility of SB RT to this second biopsy-proven lesion.  COMPLICATIONS OF TREATMENT: none  FOLLOW UP COMPLIANCE: keeps appointments   PHYSICAL EXAM:  BP (!) 157/84   Pulse 84   Temp 97.2 F (36.2 C)   Wt 148 lb 0.6 oz (67.1 kg)   BMI 23.89 kg/m  Well-developed well-nourished patient in NAD. HEENT reveals PERLA, EOMI, discs not visualized.  Oral cavity is clear. No oral mucosal lesions are identified. Neck is clear without evidence of cervical or supraclavicular adenopathy. Lungs are clear to A&P. Cardiac examination is essentially unremarkable with regular rate and rhythm without murmur rub or thrill. Abdomen is benign with no organomegaly or masses noted. Motor sensory and DTR levels are equal and symmetric in the upper and lower extremities. Cranial nerves II through XII are grossly intact.  Proprioception is intact. No peripheral adenopathy or edema is identified. No motor or sensory levels are noted. Crude visual fields are within normal range.  RADIOLOGY RESULTS: CT scans and PET/CT scans reviewed.  PLAN: At this time we have diffuse Roche old treatment fields with her new CT scan and have determine there is possibility for second course of SB RT. Risks and benefits of treatment including possible fatigue possible slight overlap of previous fields causing some lung damage, alteration of blood counts and possible slight dysphasia all were discussed in detail with the patient. I have personally ordered CT simulation with motion study at which time we will merge her previous treatment fields in this determine exact possibility of SB RT. We may need to go to a less hypofractionated course of treatment depending on her overall exposure to the rest of the lung and previous treatment fields. All of this was explained in detail to the patient.  I would like to take this opportunity to thank you for allowing me to participate in the care of your patient.Armstead Peaks., MD

## 2016-08-24 NOTE — Progress Notes (Signed)
Per Dr. Rogue Bussing. Md spoke with Dr. Massie Maroon. cnl apt with Dr. Jacinto Reap next Wed. And r/s this apt in 2 months.  Sch. Will let pt know apt is cnl.- msg sent to sch. team

## 2016-08-29 ENCOUNTER — Ambulatory Visit
Admission: RE | Admit: 2016-08-29 | Discharge: 2016-08-29 | Disposition: A | Discharge status: Home / Self Care | Payer: BLUE CROSS/BLUE SHIELD | Source: Ambulatory Visit | Attending: Radiation Oncology | Admitting: Radiation Oncology

## 2016-08-29 ENCOUNTER — Inpatient Hospital Stay: Payer: BLUE CROSS/BLUE SHIELD | Admitting: Internal Medicine

## 2016-08-29 ENCOUNTER — Encounter: Payer: Self-pay | Admitting: *Deleted

## 2016-08-29 DIAGNOSIS — C342 Malignant neoplasm of middle lobe, bronchus or lung: Secondary | ICD-10-CM | POA: Diagnosis not present

## 2016-09-03 DIAGNOSIS — C342 Malignant neoplasm of middle lobe, bronchus or lung: Secondary | ICD-10-CM | POA: Diagnosis not present

## 2016-09-05 DIAGNOSIS — C342 Malignant neoplasm of middle lobe, bronchus or lung: Secondary | ICD-10-CM | POA: Diagnosis not present

## 2016-09-11 ENCOUNTER — Ambulatory Visit
Admission: RE | Admit: 2016-09-11 | Discharge: 2016-09-11 | Disposition: A | Discharge status: Home / Self Care | Payer: BLUE CROSS/BLUE SHIELD | Source: Ambulatory Visit | Attending: Radiation Oncology | Admitting: Radiation Oncology

## 2016-09-11 DIAGNOSIS — C342 Malignant neoplasm of middle lobe, bronchus or lung: Secondary | ICD-10-CM | POA: Diagnosis not present

## 2016-09-12 ENCOUNTER — Telehealth: Payer: Self-pay | Admitting: *Deleted

## 2016-09-12 NOTE — Telephone Encounter (Signed)
Called to report that she has been opened to Case Management with BCBSand if you have any problems or concerns, please call her [phone] ext [phone]

## 2016-09-13 ENCOUNTER — Encounter: Payer: Self-pay | Admitting: Internal Medicine

## 2016-09-13 ENCOUNTER — Ambulatory Visit
Admission: RE | Admit: 2016-09-13 | Discharge: 2016-09-13 | Disposition: A | Discharge status: Home / Self Care | Payer: BLUE CROSS/BLUE SHIELD | Source: Ambulatory Visit | Attending: Radiation Oncology | Admitting: Radiation Oncology

## 2016-09-13 DIAGNOSIS — C342 Malignant neoplasm of middle lobe, bronchus or lung: Secondary | ICD-10-CM | POA: Diagnosis not present

## 2016-09-17 ENCOUNTER — Ambulatory Visit
Admission: RE | Admit: 2016-09-17 | Discharge: 2016-09-17 | Disposition: A | Discharge status: Home / Self Care | Payer: BLUE CROSS/BLUE SHIELD | Source: Ambulatory Visit | Attending: Radiation Oncology | Admitting: Radiation Oncology

## 2016-09-17 DIAGNOSIS — C342 Malignant neoplasm of middle lobe, bronchus or lung: Secondary | ICD-10-CM | POA: Diagnosis not present

## 2016-09-18 ENCOUNTER — Ambulatory Visit: Payer: BLUE CROSS/BLUE SHIELD

## 2016-09-19 ENCOUNTER — Ambulatory Visit
Admission: RE | Admit: 2016-09-19 | Discharge: 2016-09-19 | Disposition: A | Discharge status: Home / Self Care | Payer: BLUE CROSS/BLUE SHIELD | Source: Ambulatory Visit | Attending: Radiation Oncology | Admitting: Radiation Oncology

## 2016-09-19 ENCOUNTER — Encounter: Payer: Self-pay | Admitting: Internal Medicine

## 2016-09-19 DIAGNOSIS — C342 Malignant neoplasm of middle lobe, bronchus or lung: Secondary | ICD-10-CM | POA: Diagnosis not present

## 2016-09-19 LAB — SURGICAL PATHOLOGY

## 2016-09-20 ENCOUNTER — Ambulatory Visit: Payer: BLUE CROSS/BLUE SHIELD

## 2016-09-24 ENCOUNTER — Ambulatory Visit
Admission: RE | Admit: 2016-09-24 | Discharge: 2016-09-24 | Disposition: A | Discharge status: Home / Self Care | Payer: BLUE CROSS/BLUE SHIELD | Source: Ambulatory Visit | Attending: Radiation Oncology | Admitting: Radiation Oncology

## 2016-09-24 DIAGNOSIS — C342 Malignant neoplasm of middle lobe, bronchus or lung: Secondary | ICD-10-CM | POA: Diagnosis not present

## 2016-09-25 ENCOUNTER — Ambulatory Visit: Payer: BLUE CROSS/BLUE SHIELD

## 2016-10-29 ENCOUNTER — Other Ambulatory Visit: Payer: Self-pay | Admitting: *Deleted

## 2016-10-29 ENCOUNTER — Encounter: Payer: Self-pay | Admitting: Radiation Oncology

## 2016-10-29 ENCOUNTER — Inpatient Hospital Stay: Payer: BLUE CROSS/BLUE SHIELD | Attending: Internal Medicine | Admitting: Internal Medicine

## 2016-10-29 ENCOUNTER — Ambulatory Visit
Admission: RE | Admit: 2016-10-29 | Discharge: 2016-10-29 | Disposition: A | Discharge status: Home / Self Care | Payer: BLUE CROSS/BLUE SHIELD | Source: Ambulatory Visit | Attending: Radiation Oncology | Admitting: Radiation Oncology

## 2016-10-29 VITALS — BP 182/107 | HR 107 | Temp 97.2°F | Wt 138.0 lb

## 2016-10-29 VITALS — BP 190/92 | HR 106 | Temp 98.3°F | Resp 20 | Wt 139.7 lb

## 2016-10-29 DIAGNOSIS — D649 Anemia, unspecified: Secondary | ICD-10-CM

## 2016-10-29 DIAGNOSIS — J449 Chronic obstructive pulmonary disease, unspecified: Secondary | ICD-10-CM | POA: Diagnosis not present

## 2016-10-29 DIAGNOSIS — Z9012 Acquired absence of left breast and nipple: Secondary | ICD-10-CM

## 2016-10-29 DIAGNOSIS — F419 Anxiety disorder, unspecified: Secondary | ICD-10-CM | POA: Diagnosis not present

## 2016-10-29 DIAGNOSIS — R0602 Shortness of breath: Secondary | ICD-10-CM

## 2016-10-29 DIAGNOSIS — C3411 Malignant neoplasm of upper lobe, right bronchus or lung: Secondary | ICD-10-CM | POA: Insufficient documentation

## 2016-10-29 DIAGNOSIS — Z9221 Personal history of antineoplastic chemotherapy: Secondary | ICD-10-CM

## 2016-10-29 DIAGNOSIS — Z803 Family history of malignant neoplasm of breast: Secondary | ICD-10-CM

## 2016-10-29 DIAGNOSIS — Z853 Personal history of malignant neoplasm of breast: Secondary | ICD-10-CM | POA: Insufficient documentation

## 2016-10-29 DIAGNOSIS — E119 Type 2 diabetes mellitus without complications: Secondary | ICD-10-CM | POA: Diagnosis not present

## 2016-10-29 DIAGNOSIS — Z923 Personal history of irradiation: Secondary | ICD-10-CM | POA: Insufficient documentation

## 2016-10-29 DIAGNOSIS — I10 Essential (primary) hypertension: Secondary | ICD-10-CM

## 2016-10-29 DIAGNOSIS — Z87891 Personal history of nicotine dependence: Secondary | ICD-10-CM | POA: Diagnosis not present

## 2016-10-29 DIAGNOSIS — Z79899 Other long term (current) drug therapy: Secondary | ICD-10-CM

## 2016-10-29 DIAGNOSIS — M129 Arthropathy, unspecified: Secondary | ICD-10-CM | POA: Diagnosis not present

## 2016-10-29 DIAGNOSIS — R05 Cough: Secondary | ICD-10-CM | POA: Insufficient documentation

## 2016-10-29 NOTE — Progress Notes (Signed)
[name] OFFICE PROGRESS NOTE  Patient Care Team: [facility] as PCP - General  Breast cancer in female [facility])   Staging form: Breast, AJCC 7th Edition   - Clinical: Stage IIIA (T3, N1, M0) - Signed by [name], NP on 02/21/2016  Lung cancer, upper lobe ([place])   Staging form: Lung, AJCC 7th Edition   - Clinical stage from 10/23/2014: T1, N0, M0 - Signed by [name], NP on 02/21/2016   Oncology History   # 2016- JAN RUL 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 A. LUNG MASS, RIGHT; CT-GUIDED BIOPSY: - ADENOCARCINOMA, ACINAR AND PAPILLARY MORPHOLOGIES. 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]         Breast cancer in female ([place])   07/24/1999 Initial Diagnosis    Breast cancer in female ([place]) T3 N1 M0 tumor ER/PR positive      08/24/1999 -  Anti-estrogen oral therapy    Started tamoxifen      08/24/1999 Surgery    Status post modified radical mastectomy of left breast       Chemotherapy          Radiation Therapy         11/24/2003 - 02/20/2009 Anti-estrogen oral therapy    Started Aromasin       Lung cancer, upper lobe ([place])   08/23/2014 Initial Diagnosis    Lung cancer, upper lobe of right lung ([place]) T1 N0 M0       Carcinoma of overlapping sites of left breast in female, estrogen receptor positive ([place])   06/22/2016 Initial Diagnosis    Cancer of overlapping sites of left female breast [facility])      Primary cancer of right upper lobe of lung ([place])     INTERVAL HISTORY:  Danielle [name] 61 y.o.  female pleasant patient above history ofBreast cancer; Stage I squamous cell lung cancer; and also history of right middle lobe lung Adenocarcinoma [biopsy September  2017] status post radiation finished December 2017 is here for follow-up   Patient has chronic shortness of breath. Chronic cough or any worse. No hemoptysis. Denies any chest pain. Denies any lumps bumps. No weight loss. She denies any headaches. Denies any chest pain. States that she has not taken her blood pressure medication yet this morning. Denies any swelling in the legs.  REVIEW OF SYSTEMS:  A complete 10 point review of system is done which is negative except mentioned above/history of present illness.   PAST MEDICAL HISTORY :  Past Medical History:  Diagnosis Date  . Anxiety   . Arthritis   . Breast cancer in female [facility]) 02/21/2016  . Cancer ([place]) left    breast cancer 2000  . COPD (chronic obstructive pulmonary disease) ([place])   . Diabetes mellitus without complication ([place])   . Heart murmur   . Hypertension   . Lung nodule   . Shortness of breath dyspnea    with exertion  . Status post chemotherapy 2001   left breast cancer  . Status post radiation therapy 2001   left breast cancer    PAST SURGICAL HISTORY :   Past Surgical History:  Procedure Laterality Date  . Breast Biospy Left    [facility]  . BREAST SURGERY    . DILATION AND CURETTAGE OF UTERUS    . ELECTROMAGNETIC NAVIGATION BROCHOSCOPY Right 04/11/2016   Procedure: ELECTROMAGNETIC NAVIGATION BRONCHOSCOPY;  Surgeon: Vilinda Boehringer, MD;  Location: [facility] ORS;  Service: Cardiopulmonary;  Laterality: Right;  . LUNG BIOPSY    . MASTECTOMY Left    2000, [facility]  . ROTATOR CUFF REPAIR Right    [facility]    FAMILY HISTORY :   Family History  Problem Relation Age of Onset  . Breast cancer Mother 66  . Cancer Mother     Breast   . Breast cancer Paternal Aunt 1  . Cancer Maternal Aunt     Breast     SOCIAL HISTORY:   Social History  Substance Use Topics  . Smoking status: Former Smoker    Packs/day: 0.50    Years: 20.00    Types: Cigarettes    Quit date: 07/02/2012  . Smokeless tobacco: Never Used     Comment: quit  2014  . Alcohol use Yes     Comment: occ    ALLERGIES:  has No Known Allergies.  MEDICATIONS:  Current Outpatient Prescriptions  Medication Sig Dispense Refill  . albuterol (PROAIR HFA) 108 (90 BASE) MCG/ACT inhaler Inhale 1-2 puffs into the lungs every 6 (six) hours as needed for wheezing or shortness of breath.     [name] amLODipine (NORVASC) 10 MG tablet Take 10 mg by mouth daily. for blood pressure  3  . budesonide-formoterol (SYMBICORT) 160-4.5 MCG/ACT inhaler Inhale 2 puffs into the lungs 2 (two) times daily.    . celecoxib (CELEBREX) 200 MG capsule Take 1 capsule (200 mg total) by mouth daily. 30 capsule 0  . citalopram (CELEXA) 20 MG tablet Take 20 mg by mouth daily.     [name] losartan-hydrochlorothiazide (HYZAAR) 100-25 MG tablet Take 1 tablet by mouth daily. 30 tablet 0  . meloxicam (MOBIC) 7.5 MG tablet Take 7.5 mg by mouth daily.     . potassium chloride SA (K-DUR,KLOR-CON) 20 MEQ tablet Take 1 tablet (20 mEq total) by mouth daily. 30 tablet 6  . tiotropium (SPIRIVA) 18 MCG inhalation capsule Place into inhaler and inhale.     No current facility-administered medications for this visit.     PHYSICAL EXAMINATION: ECOG PERFORMANCE STATUS: 0 - Asymptomatic  BP (!) 182/107 (BP Location: Right Arm, Patient Position: Sitting)   Pulse (!) 107   Temp 97.2 F (36.2 C) (Tympanic)   Wt 138 lb (62.6 kg)   BMI 22.27 kg/m   Filed Weights   10/29/16 1057  Weight: 138 lb (62.6 kg)    GENERAL: Well-nourished well-developed; Alert, no distress and comfortable.   Alone.  EYES: no pallor or icterus OROPHARYNX: no thrush or ulceration; good dentition  NECK: supple, no masses felt LYMPH:  no palpable lymphadenopathy in the cervical, axillary or inguinal regions LUNGS: clear to auscultation and  No wheeze or crackles HEART/CVS: regular rate & rhythm and no murmurs; No lower extremity edema ABDOMEN:abdomen soft, non-tender and normal bowel sounds Musculoskeletal:no cyanosis of digits and no  clubbing  PSYCH: alert & oriented x 3 with fluent speech NEURO: no focal motor/sensory deficits SKIN:  no rashes or significant lesions  LABORATORY DATA:  I have reviewed the data as listed    Component Value Date/Time   NA 139 06/22/2016 1421   NA 132 (L) 02/09/2015 1100   K 3.8 06/22/2016 1421   K  3.8 02/09/2015 1100   CL 105 06/22/2016 1421   CL 95 (L) 02/09/2015 1100   CO2 25 06/22/2016 1421   CO2 29 02/09/2015 1100   GLUCOSE 112 (H) 06/22/2016 1421   GLUCOSE 105 (H) 02/09/2015 1100   BUN 32 (H) 06/22/2016 1421   BUN 16 02/09/2015 1100   CREATININE 1.08 (H) 06/22/2016 1421   CREATININE 0.81 02/09/2015 1100   CALCIUM 9.5 06/22/2016 1421   CALCIUM 9.1 02/09/2015 1100   PROT 7.8 06/22/2016 1421   PROT 7.7 02/09/2015 1100   ALBUMIN 4.3 06/22/2016 1421   ALBUMIN 4.3 02/09/2015 1100   AST 35 06/22/2016 1421   AST 29 02/09/2015 1100   ALT 33 06/22/2016 1421   ALT 20 02/09/2015 1100   ALKPHOS 84 06/22/2016 1421   ALKPHOS 69 02/09/2015 1100   BILITOT 0.5 06/22/2016 1421   BILITOT 0.9 02/09/2015 1100   GFRNONAA 54 (L) 06/22/2016 1421   GFRNONAA >60 02/09/2015 1100   GFRAA >60 06/22/2016 1421   GFRAA >60 02/09/2015 1100    No results found for: SPEP, UPEP  Lab Results  Component Value Date   WBC 4.6 07/30/2016   NEUTROABS 2.5 06/22/2016   HGB 11.6 (L) 07/30/2016   HCT 34.2 (L) 07/30/2016   MCV 94.6 07/30/2016   PLT 289 07/30/2016      Chemistry      Component Value Date/Time   NA 139 06/22/2016 1421   NA 132 (L) 02/09/2015 1100   K 3.8 06/22/2016 1421   K 3.8 02/09/2015 1100   CL 105 06/22/2016 1421   CL 95 (L) 02/09/2015 1100   CO2 25 06/22/2016 1421   CO2 29 02/09/2015 1100   BUN 32 (H) 06/22/2016 1421   BUN 16 02/09/2015 1100   CREATININE 1.08 (H) 06/22/2016 1421   CREATININE 0.81 02/09/2015 1100      Component Value Date/Time   CALCIUM 9.5 06/22/2016 1421   CALCIUM 9.1 02/09/2015 1100   ALKPHOS 84 06/22/2016 1421   ALKPHOS 69 02/09/2015 1100    AST 35 06/22/2016 1421   AST 29 02/09/2015 1100   ALT 33 06/22/2016 1421   ALT 20 02/09/2015 1100   BILITOT 0.5 06/22/2016 1421   BILITOT 0.9 02/09/2015 1100    IMPRESSION: 1. The enlarging irregular nodule involving the right middle and upper lobes is mildly hypermetabolic, supporting the likelihood of malignancy, probably adenocarcinoma. Tissue sampling recommended. 2. No other suspicious findings.  No evidence of metastatic disease.   Electronically Signed   By: Richardean Sale M.D.   On: 07/13/2016 14:33   RADIOGRAPHIC STUDIES: I have personally reviewed the radiological images as listed and agreed with the findings in the report. No results found.   ASSESSMENT & PLAN:  Primary cancer of right upper lobe of lung ([place]) # Right lung Lung nodules ~ 2 cm in size; s/p Bx- proven adenocarcinoma. S/p RT dec 2017. No evidence of any clinical progression.  # Poor control blood pressure recommend checking blood pressure home. Also recommend taking a blood pressure as soon as she goes home.  # breast ca- NED  # Anemia- hb-10.5; NOT iron deficient; monitor for now. Repeat Labs at next visit.   # Follow-up with me on May 14th after the appointment with radiation oncology;pt has CT ordered on May 8th.   Orders Placed This Encounter  Procedures  . Basic metabolic panel    Standing Status:   Future    Standing Expiration Date:   04/28/2017  . CBC  with Differential/Platelet    Standing Status:   Future    Standing Expiration Date:   04/28/2017   All questions were answered. The patient knows to call the clinic with any problems, questions or concerns.      [name], MD 10/29/2016 11:18 AM

## 2016-10-29 NOTE — Assessment & Plan Note (Addendum)
#   Right lung Lung nodules ~ 2 cm in size; s/p Bx- proven adenocarcinoma. S/p RT dec 2017. No evidence of any clinical progression. Await CT scan in order to radiation oncology in May 2018.  # Poor control blood pressure recommend checking blood pressure home. Also recommend taking a blood pressure as soon as she goes home.  # breast ca- NED  # Anemia- hb-10.5; NOT iron deficient; monitor for now. Repeat Labs at next visit.   # Follow-up with me on May 14th after the appointment with radiation oncology;pt has CT ordered on May 8th.

## 2016-10-29 NOTE — Progress Notes (Signed)
Radiation Oncology Follow up Note  Name: Danielle [name]   Date:   10/29/2016 MRN:  [id number] DOB: [birth year]    This 61 y.o. female presents to the clinic today for one-month follow-up status post SB RT for a second right upper lobe non-small cell lung cancer.  REFERRING PROVIDER: [facility]  HPI: Patient is a 61 year old female now out 1 month having completed her second SB RT for non-small cell lung cancer. 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 She also had SB RT to a right upper lobe lesion and this was her second SB RT to the right upper lobe. She is seen today in routine follow-up one month out and is doing well. She specifically denies cough hemoptysis or chest tightness. Does have a mild nonproductive cough..  COMPLICATIONS OF TREATMENT: none  FOLLOW UP COMPLIANCE: keeps appointments   PHYSICAL EXAM:  BP (!) 190/92   Pulse (!) 106   Temp 98.3 F (36.8 C)   Resp 20   Wt 139 lb 10.6 oz (63.3 kg)   BMI 22.54 kg/m  Well-developed well-nourished patient in NAD. HEENT reveals PERLA, EOMI, discs not visualized.  Oral cavity is clear. No oral mucosal lesions are identified. Neck is clear without evidence of cervical or supraclavicular adenopathy. Lungs are clear to A&P. Cardiac examination is essentially unremarkable with regular rate and rhythm without murmur rub or thrill. Abdomen is benign with no organomegaly or masses noted. Motor sensory and DTR levels are equal and symmetric in the upper and lower extremities. Cranial nerves II through XII are grossly intact. Proprioception is intact. No peripheral adenopathy or edema is identified. No motor or sensory levels are noted. Crude visual fields are within normal range.  RADIOLOGY RESULTS: CT scan with contrast of the chest has been ordered in 4 months  PLAN:  Present time patient is doing well 1 month out from SB RT with no side effects or complaints. I've asked to see her back in 4 months and will obtain a CT scan with contrast 1 week prior to that visit. She is doing extremely well. Patient family know to call with any concerns.  I would like to take this opportunity to thank you for allowing me to participate in the care of your patient.Armstead Peaks., MD

## 2016-10-29 NOTE — Progress Notes (Signed)
Patient here today for follow up.  Patient states no new concerns today  

## 2016-11-13 IMAGING — US US EXTREM LOW VENOUS*R*
1 series · 13 of 24 positions shown · non-contrast
Comparison: None.

CLINICAL DATA: Right lower extremity pain and edema.



[Series 1: us extrem low venous*right* · 0.08mm/px · 13 of 30 slices shown]
[im 1/30]
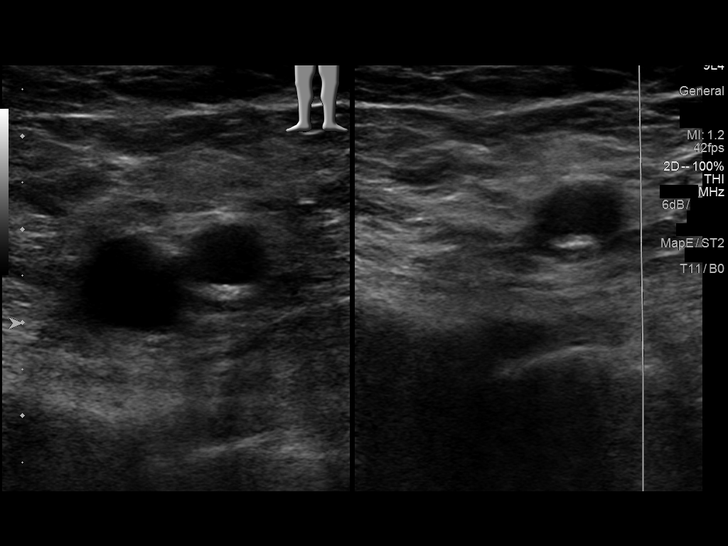
[im 3/30]
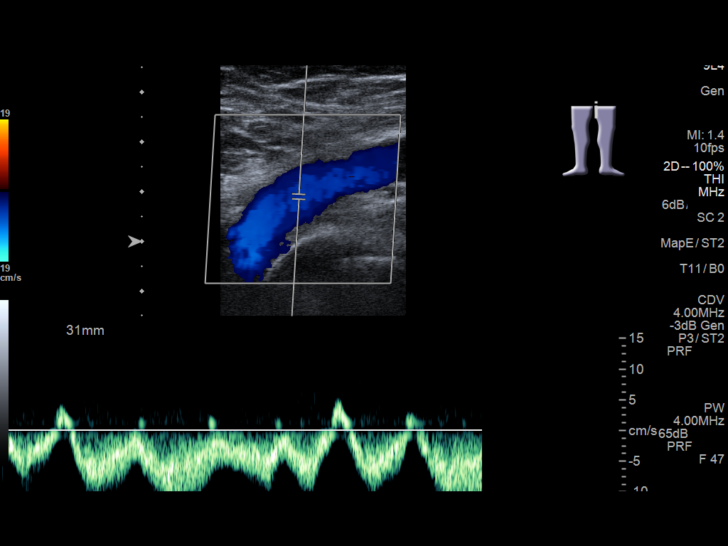
[im 6/30]
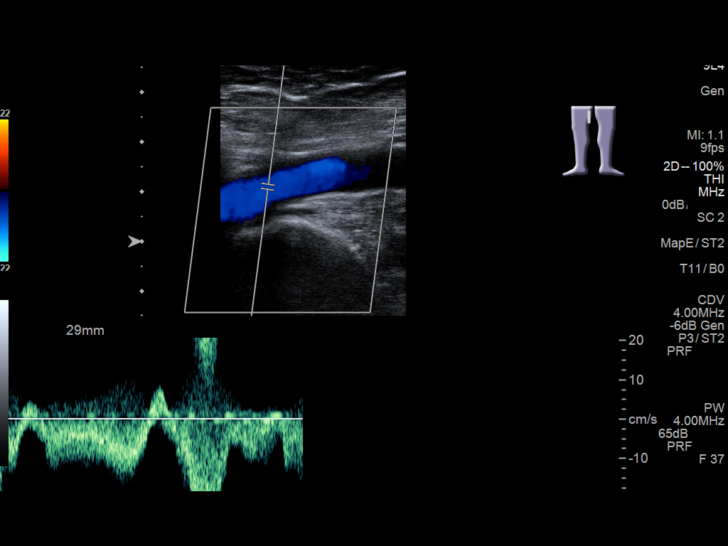
[im 8/30]
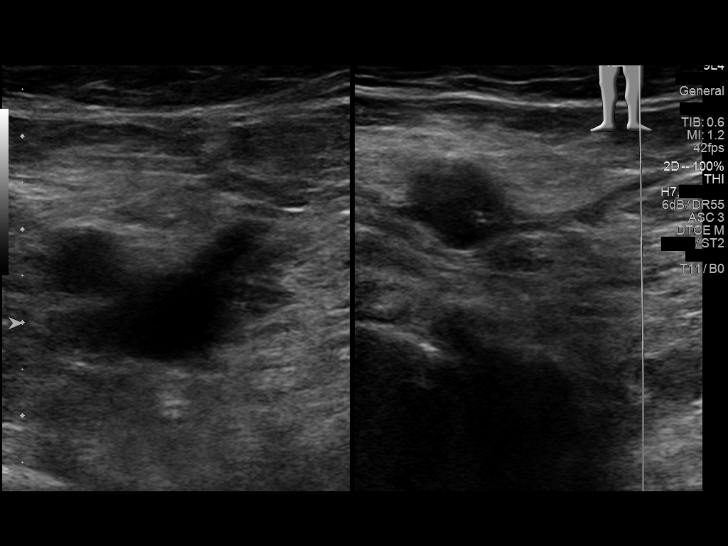
[im 11/30]
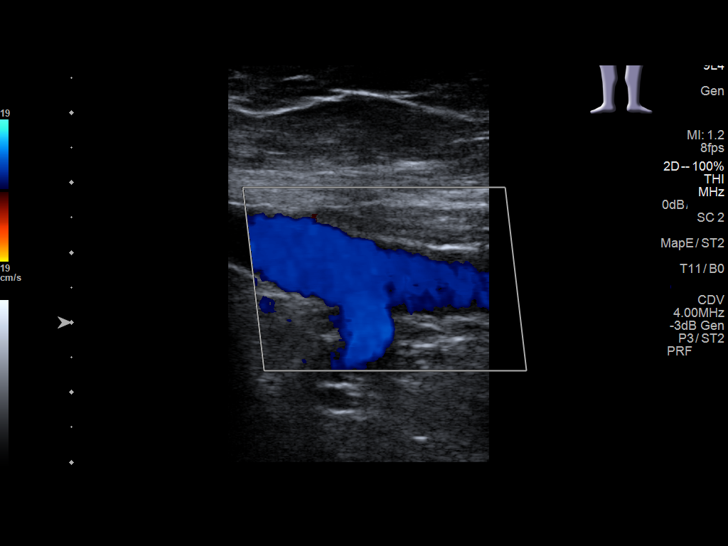
[im 13/30]
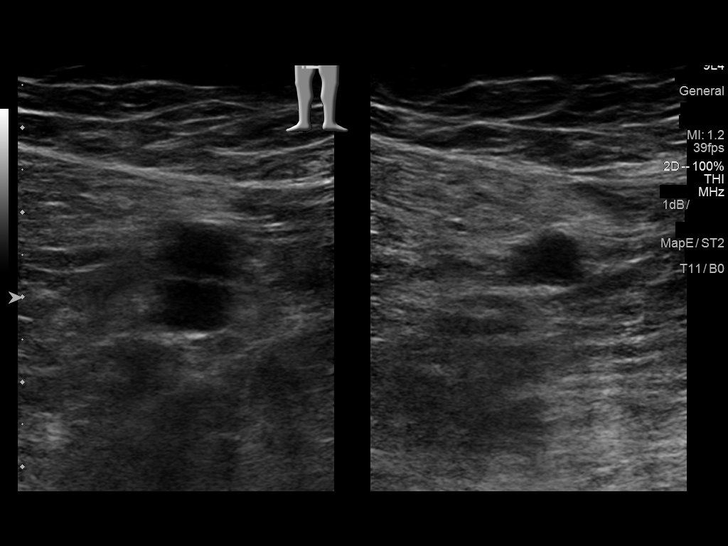
[im 16/30]
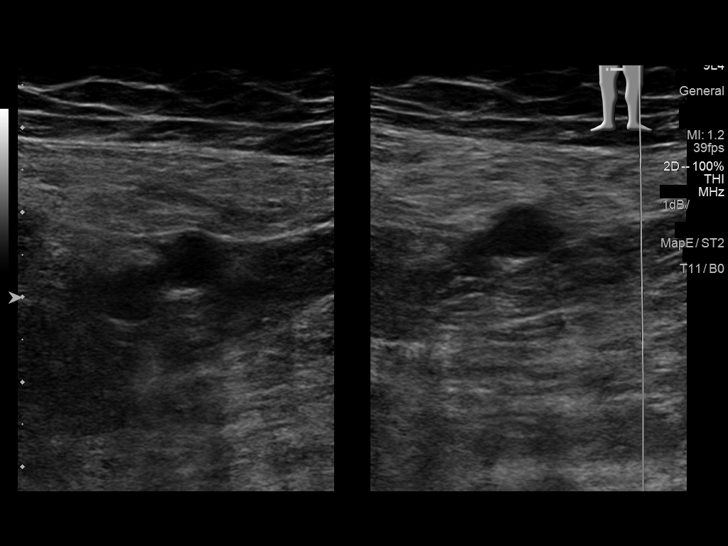
[im 17/30]
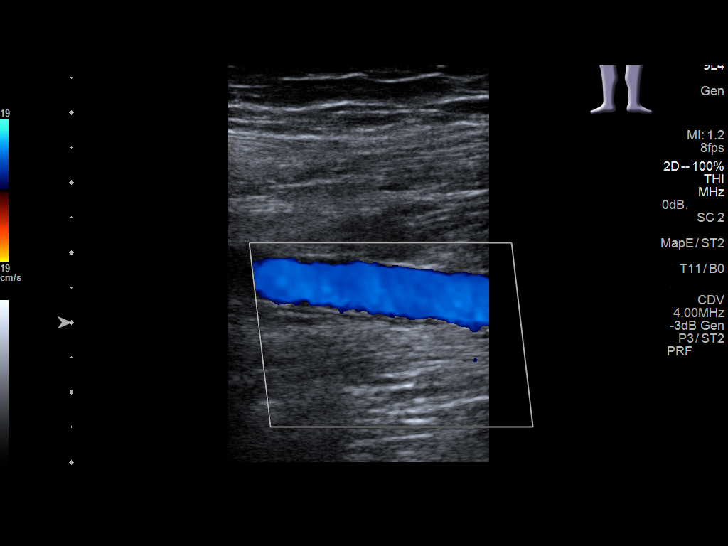
[im 19/30]
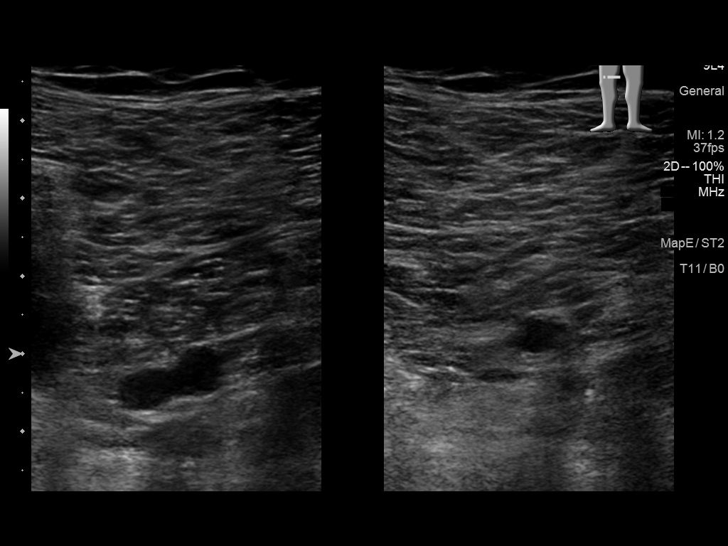
[im 22/30]
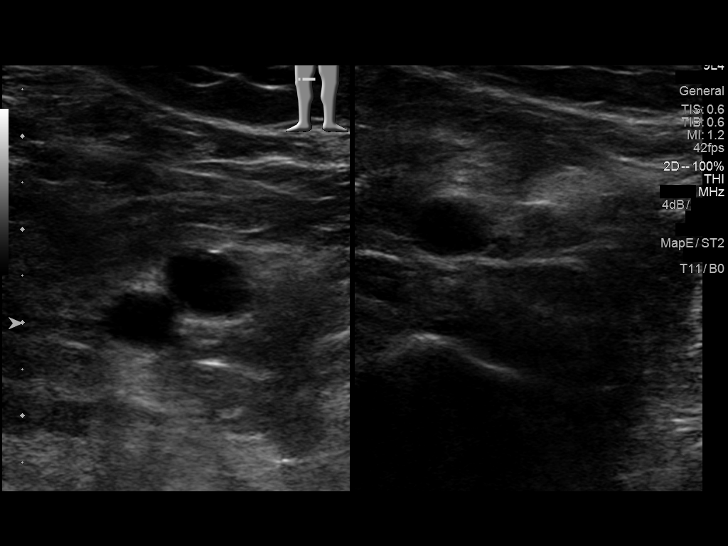
[im 24/30]
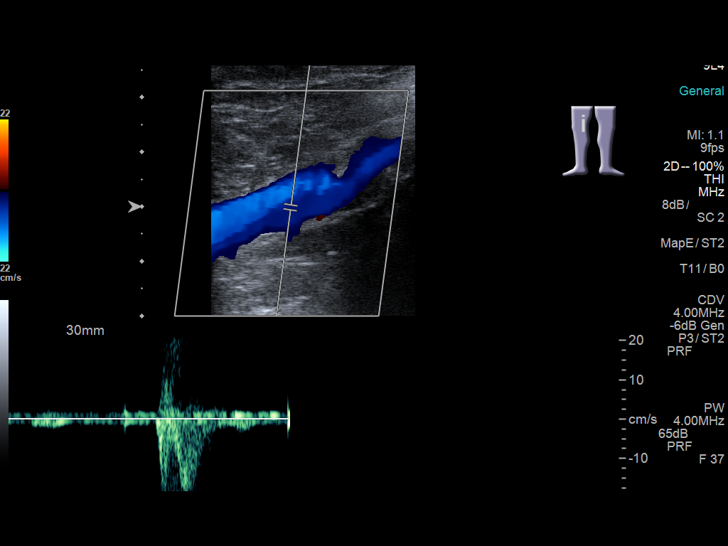
[im 27/30]
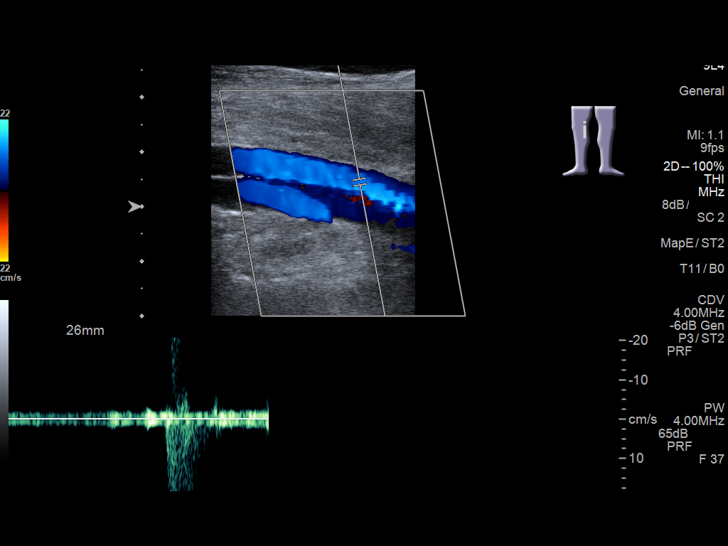
[im 30/30]
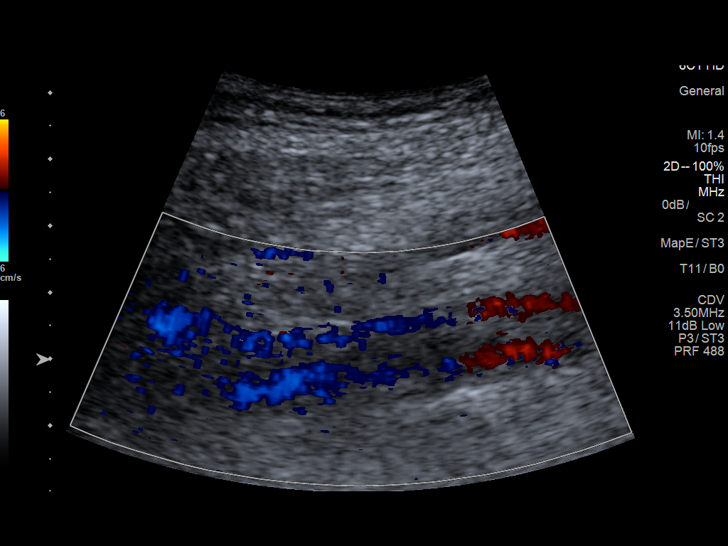

[13 of 24 positions shown; findings below may reference images not displayed]

FINDINGS: Contralateral Common Femoral Vein: Respiratory phasicity is normal
and symmetric with the symptomatic side. No evidence of thrombus.
Normal compressibility.

Common Femoral Vein: No evidence of thrombus. Normal
compressibility, respiratory phasicity and response to augmentation.

Saphenofemoral Junction: No evidence of thrombus. Normal
compressibility and flow on color Doppler imaging.

Profunda Femoral Vein: No evidence of thrombus. Normal
compressibility and flow on color Doppler imaging.

Femoral Vein: No evidence of thrombus. Normal compressibility,
respiratory phasicity and response to augmentation.

Popliteal Vein: No evidence of thrombus. Normal compressibility,
respiratory phasicity and response to augmentation.

Calf Veins: No evidence of thrombus. Normal compressibility and flow
on color Doppler imaging.

Superficial Great Saphenous Vein: No evidence of thrombus. Normal
compressibility and flow on color Doppler imaging.

Venous Reflux:  None.

Other Findings:  None.
IMPRESSION: No evidence of right lower extremity deep venous thrombosis.

## 2016-12-24 IMAGING — CT CT CHEST W/O CM
2 of 3 series · 14 of 36 positions shown, 17 images · non-contrast
Comparison: 02/07/2015 chest CT.

CLINICAL DATA: Left breast cancer status post mastectomy in 6990.
Squamous cell lung carcinoma of the right upper lobe diagnosed in
October 2014 presenting for restaging.

EXAM:
CT CHEST WITHOUT CONTRAST
TECHNIQUE: Multidetector CT imaging of the chest was performed following the
standard protocol without IV contrast.

[Series 2: routine chest wo · axial · 0.63mm/px · z∈[-596,-336]mm · 11 of 62 slices shown, 14 images]
[im 5/62  mediastinal]
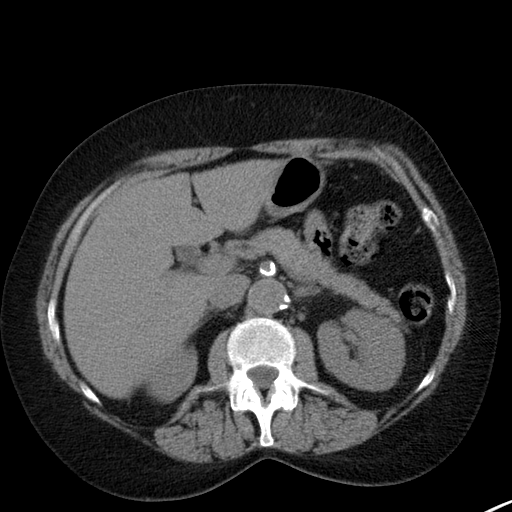
[im 5/62  lung]
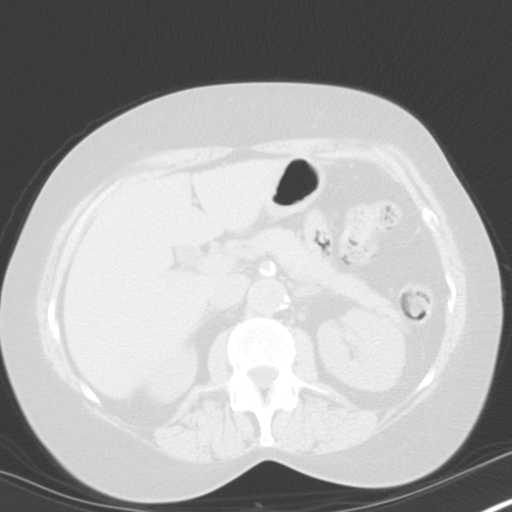
[im 10/62  lung]
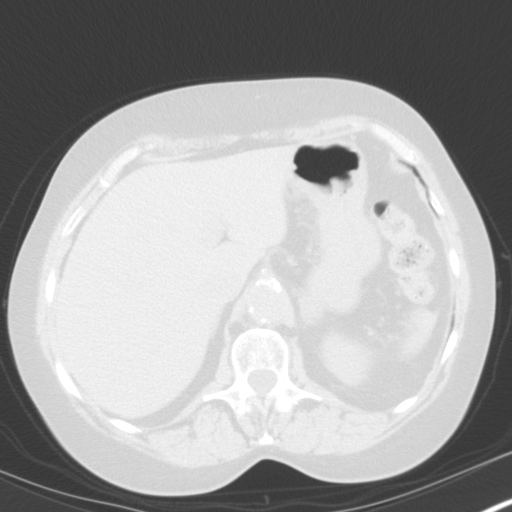
[im 14/62  lung]
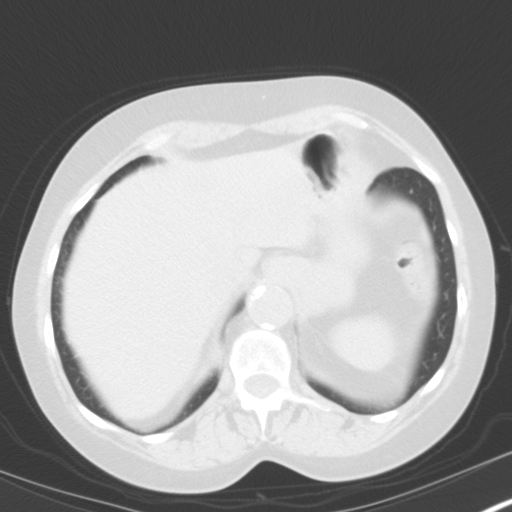
[im 21/62  lung]
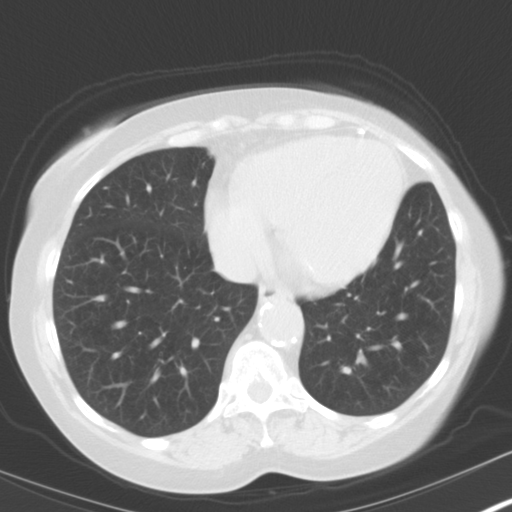
[im 25/62  mediastinal]
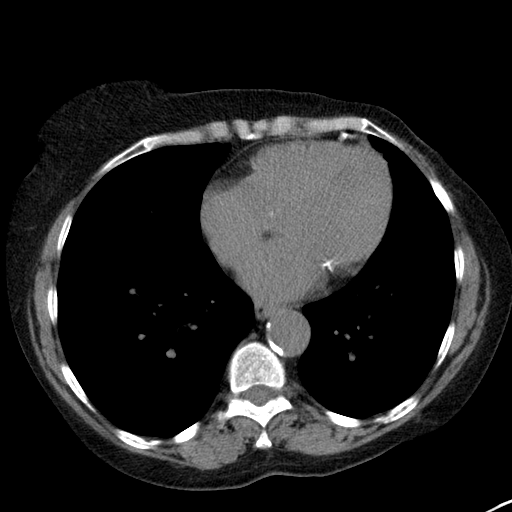
[im 25/62  lung]
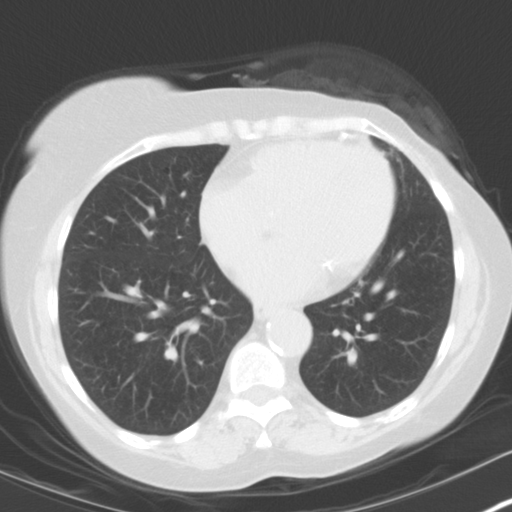
[im 32/62  lung]
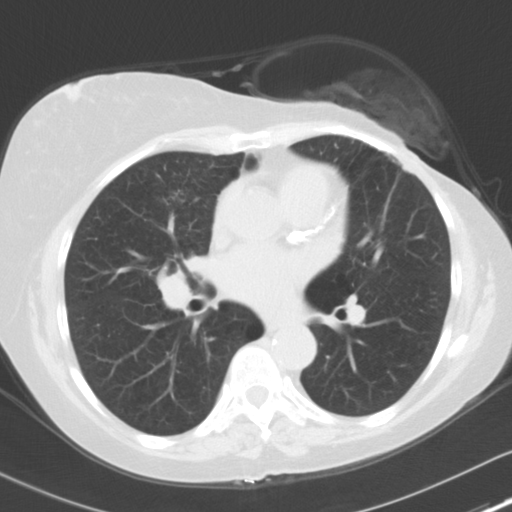
[im 37/62  lung]
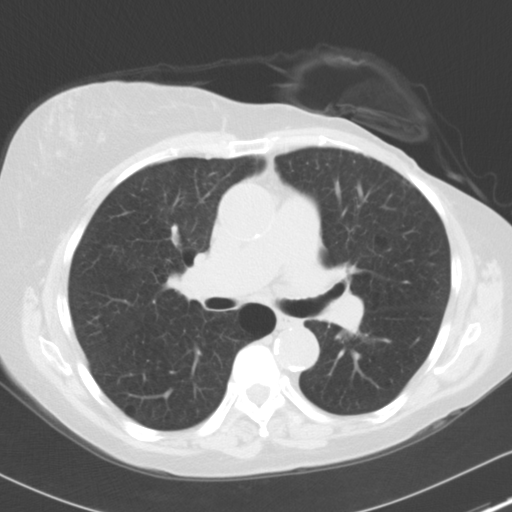
[im 41/62  lung]
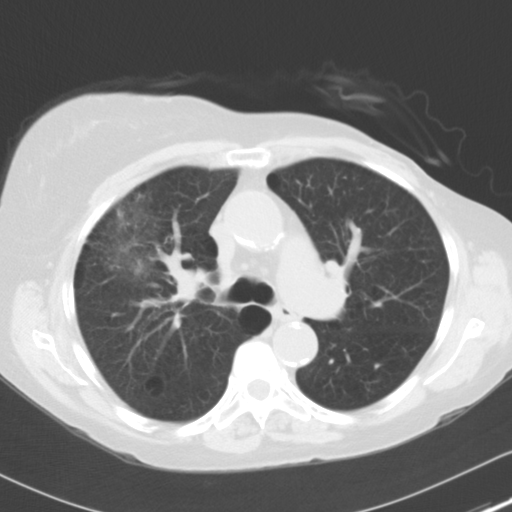
[im 48/62  mediastinal]
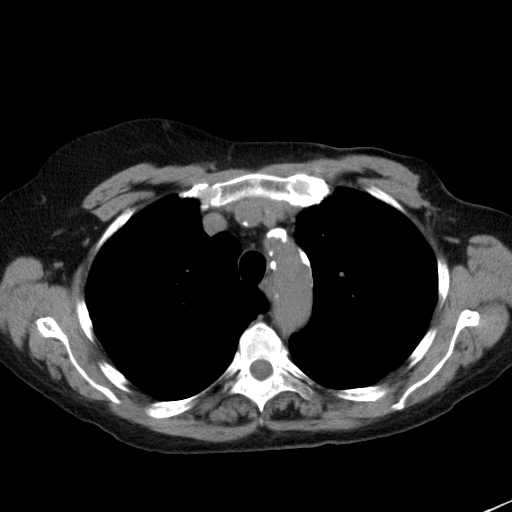
[im 48/62  lung]
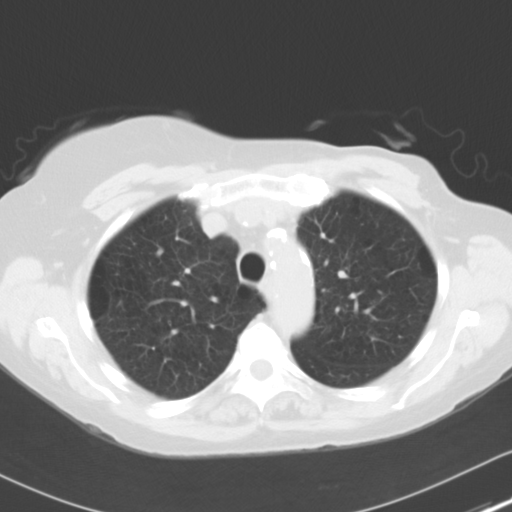
[im 52/62  lung]
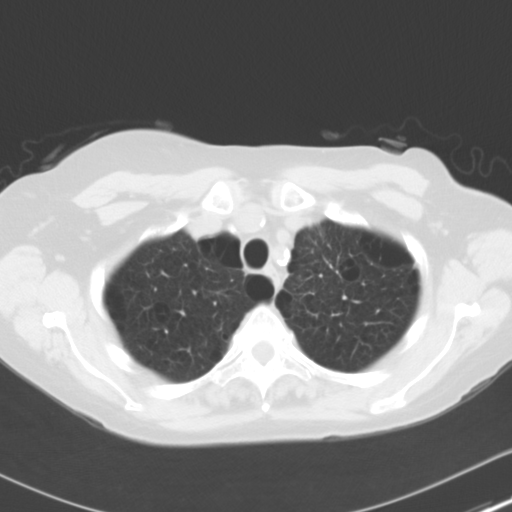
[im 57/62  lung]
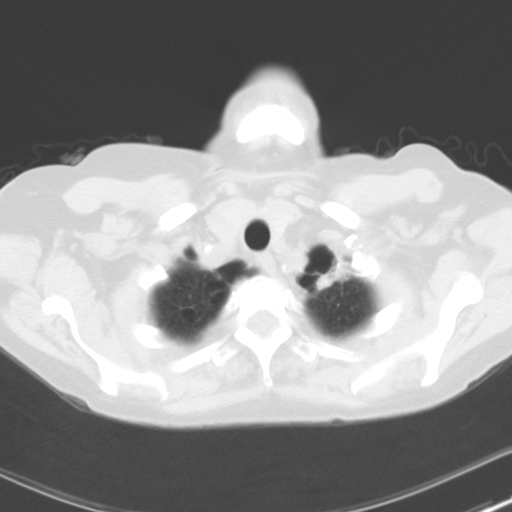

[Series 5: cor routine chest wo · coronal · 0.61mm/px · 3 of 121 slices shown]
[im 25/121  lung]
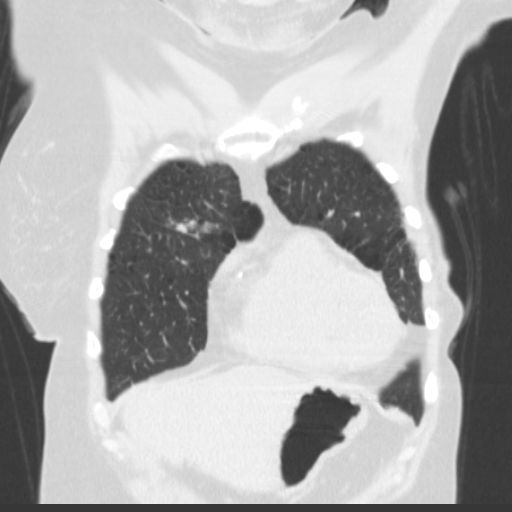
[im 49/121  lung]
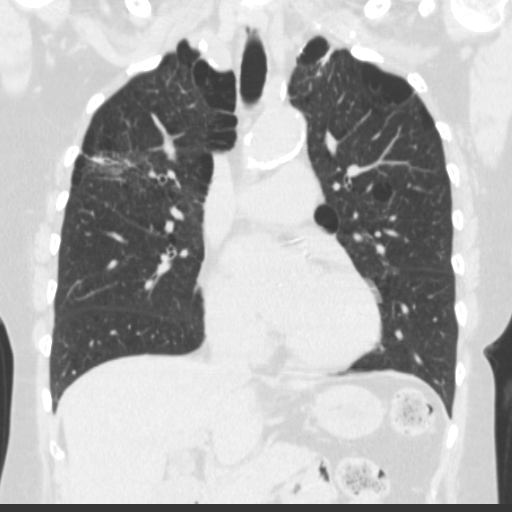
[im 73/121  lung]
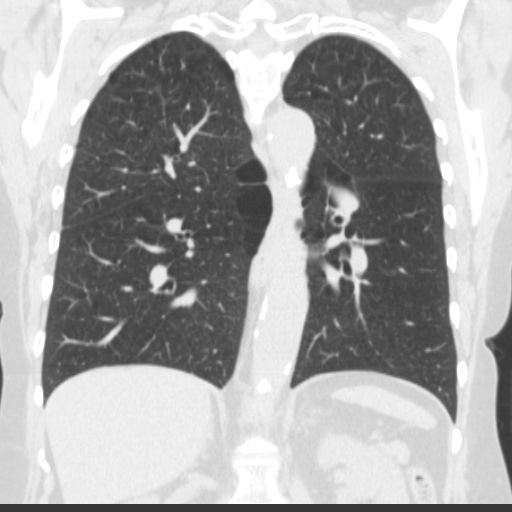

[14 of 36 positions shown; findings below may reference images not displayed]

FINDINGS: Mediastinum/Nodes: Normal heart size. No pericardial
fluid/thickening. There is atherosclerosis of the thoracic aorta,
the great vessels of the mediastinum and the coronary arteries,
including calcified atherosclerotic plaque in the left main, left
anterior descending, left circumflex and right coronary arteries.
Great vessels are normal in course and caliber. Normal visualized
thyroid. Normal esophagus. No pathologically enlarged axillary,
mediastinal or gross lymph nodes, noting limited sensitivity for the
detection of hilar adenopathy on this noncontrast study.

Lungs/Pleura: No pneumothorax. No pleural effusion. Mild-to-moderate
centrilobular and paraseptal emphysema with diffuse bronchial wall
thickening. There is a 0.7 x 0.7 cm right upper lobe spiculated
pulmonary nodule (series 4/ image 21), decreased from 0.9 x 0.6 cm.
There is new sharply marginated patchy consolidation and
ground-glass opacity in the right upper lobe surrounding the
spiculated pulmonary nodule, in keeping with postradiation change.
There is a sub-solid lung mass measuring 4.0 x 3.6 cm in total with
solid components measuring up to 1.3 cm (series 4/image 29) in the
anterior medial right lung spanning the minor fissure with
involvement of the basilar right upper lobe and medial right middle
lobe, which measured 3.9 x 3.4 cm on 02/07/2015 and 3.1 x 2.6 cm on
09/06/2014 using similar measurement technique, in keeping with
continued progression. A separate 5 mm solid right middle lobe
pulmonary nodule ([DATE]) is stable since 09/06/2014. Stable 1.0 cm
apical left upper lobe solid pulmonary nodule ([DATE]), which was non
FDG avid on 09/24/2014. Stable minimal radiation fibrosis in the
anterior left upper lobe. No new significant pulmonary nodules. No
acute consolidative airspace disease.

Upper abdomen: Stable 0.7 cm left liver lobe hypodense lesion, too
small to characterize. Stable simple 1.3 cm renal cyst in the medial
upper left kidney.

Musculoskeletal: No aggressive appearing focal osseous lesions.
Status post left mastectomy.
IMPRESSION: 1. Partial interval treatment response of the spiculated
subcentimeter right upper lobe pulmonary nodule (corresponding with
the biopsy-proven squamous cell lung carcinoma), with evolving
surrounding postradiation change.
2. Continued progression of a subsolid 4.0 cm lung mass in the
anterior medial right lung spanning the minor fissure, most in
keeping with a separate primary lung adenocarcinoma.
3. Separate 5 mm right middle lobe pulmonary nodule, stable for 11
months.
4. Mild-to-moderate centrilobular and paraseptal emphysema with
diffuse bronchial wall thickening, suggesting COPD.
5. Atherosclerosis, including left main and 3 vessel coronary artery
disease. Please note that although the presence of coronary artery
calcium documents the presence of coronary artery disease, the
severity of this disease and any potential stenosis cannot be
assessed on this non-gated CT examination.

## 2016-12-30 IMAGING — PT NM PET TUM IMG RESTAG (PS) SKULL BASE T - THIGH
10 series · 25 of 25 positions shown · non-contrast
Comparison: CT chest 08/09/2015, 02/07/2015 and PET 09/24/2014.

CLINICAL DATA: Subsequent treatment strategy for malignant neoplasm
of lung, unspecified laterality.

EXAM:
NUCLEAR MEDICINE PET SKULL BASE TO THIGH
TECHNIQUE: 12.7 mCi F-18 FDG was injected intravenously. Full-ring PET imaging
was performed from the skull base to thigh after the radiotracer. CT
data was obtained and used for attenuation correction and anatomic
localization.
FASTING BLOOD GLUCOSE:  Value: 78 mg/dl

[Series 4: ct wb 5.0 b30f · axial · 5.0mm · 0.98mm/px · z∈[-1480,-614]mm · 5 of 289 slices shown]
[im 1/289]
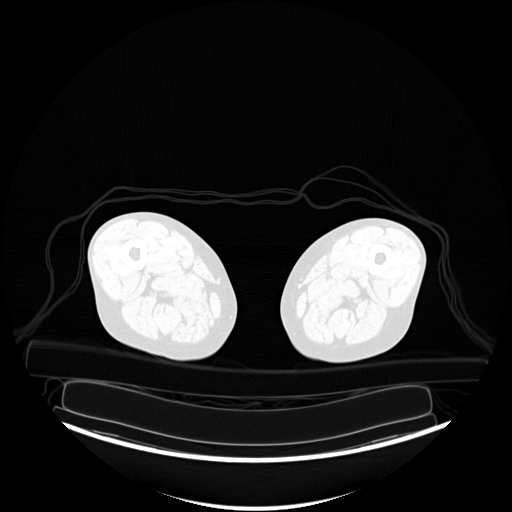
[im 73/289]
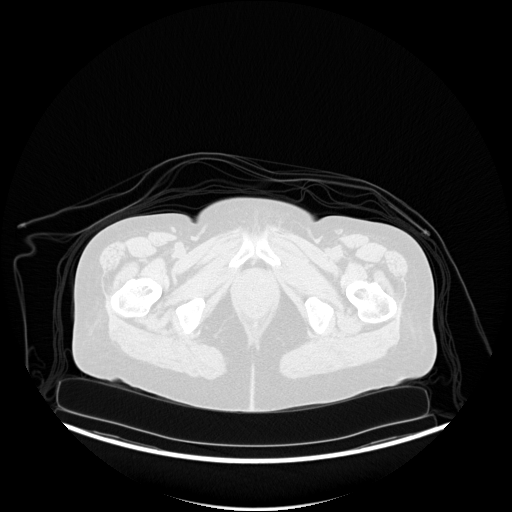
[im 145/289]
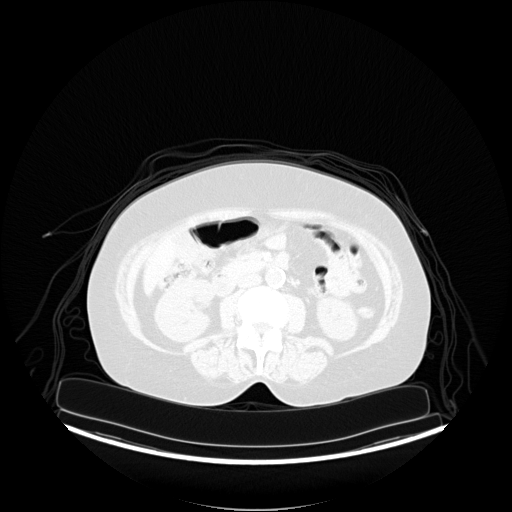
[im 217/289]
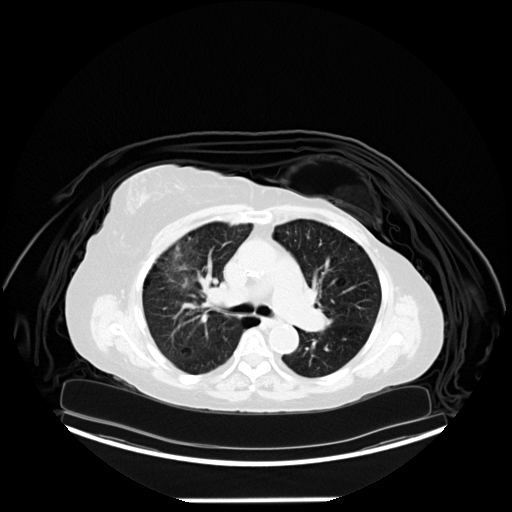
[im 289/289  brain]
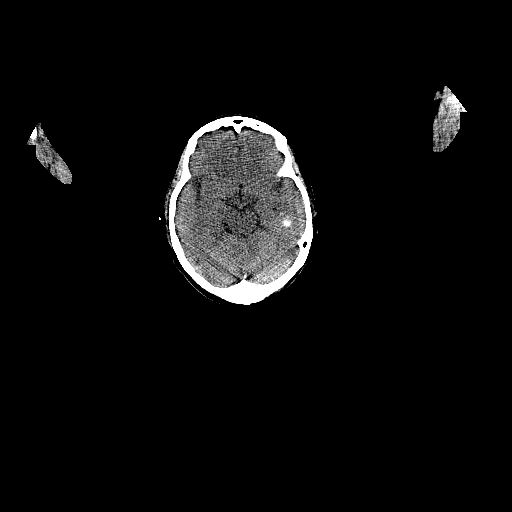

[Series 5: pet wb (ac) · axial · 5.0mm · 4.07mm/px · z∈[-1480,-614]mm · 4 of 290 slices shown]
[im 1/290]
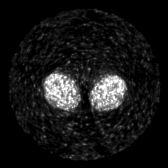
[im 97/290]
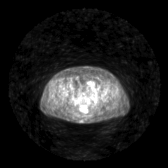
[im 193/290]
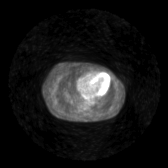
[im 290/290]
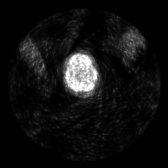

[Series 6: pet wb uncorrected (nac) · axial · 5.0mm · 4.07mm/px · z∈[-1480,-614]mm · 4 of 290 slices shown]
[im 1/290]
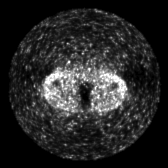
[im 97/290]
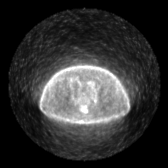
[im 193/290]
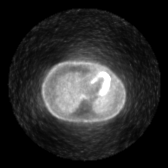
[im 290/290]
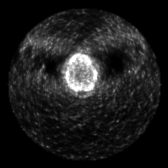

[Series 603: pet axial · 4 of 287 slices shown]
[im 1/287]
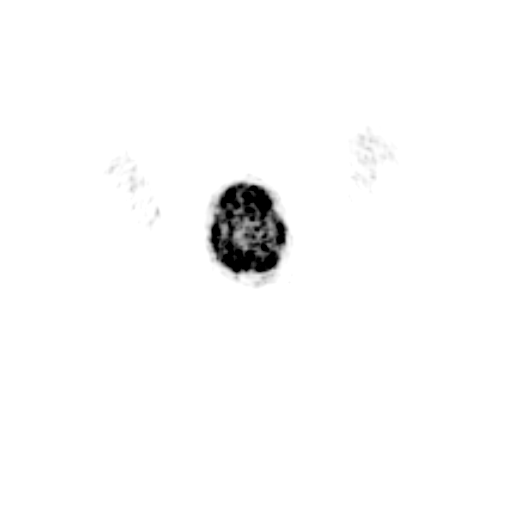
[im 96/287]
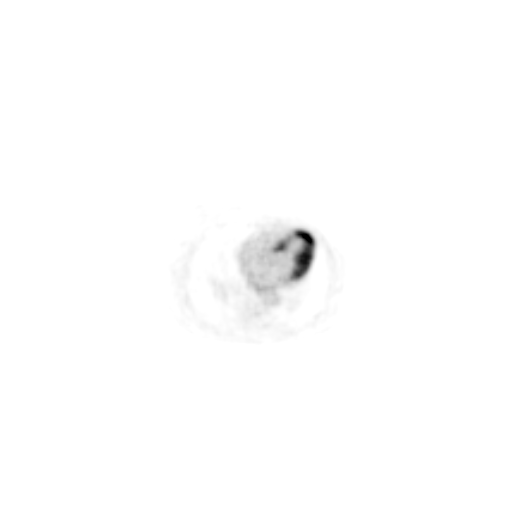
[im 191/287]
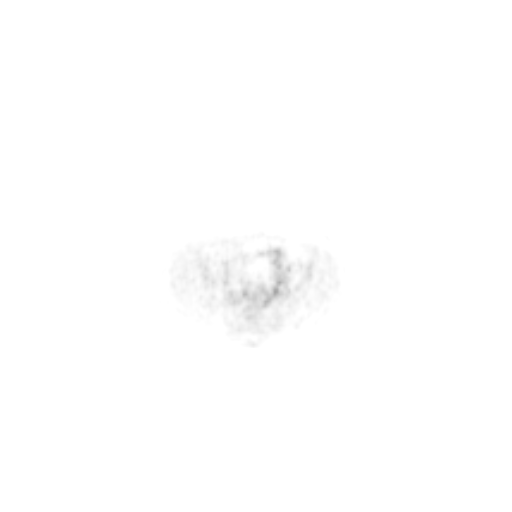
[im 287/287]
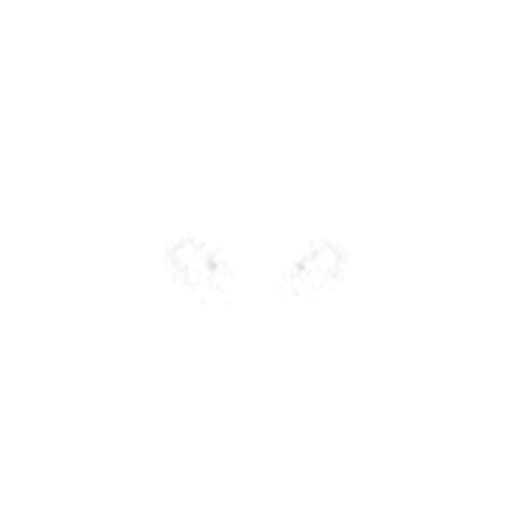

[Series 604: pet coronal · 1 of 99 slices shown]
[im 1/99]
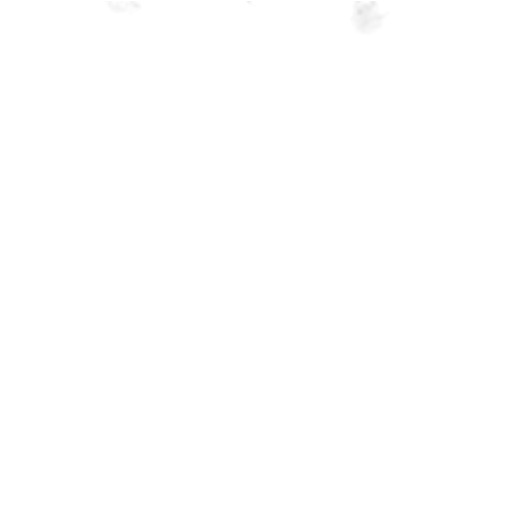

[Series 605: pet sagittal · 2 of 132 slices shown]
[im 1/132]
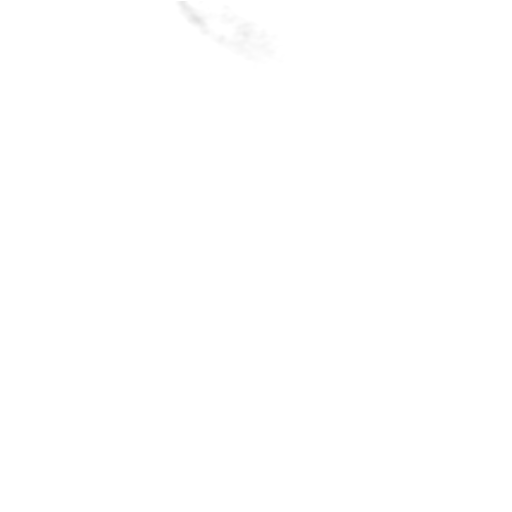
[im 132/132]
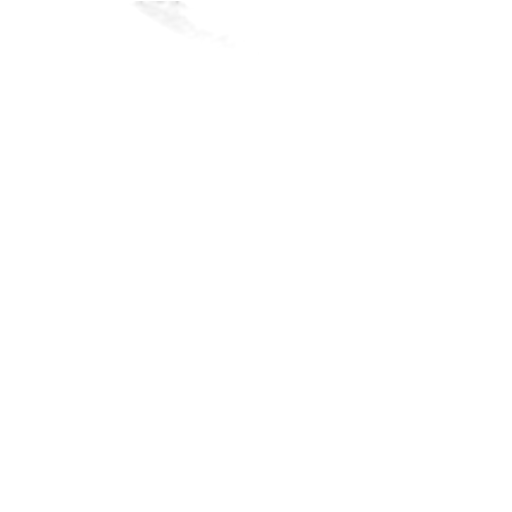

[Series 606: pet ct axial · 1 of 38 slices shown]
[im 1/38]
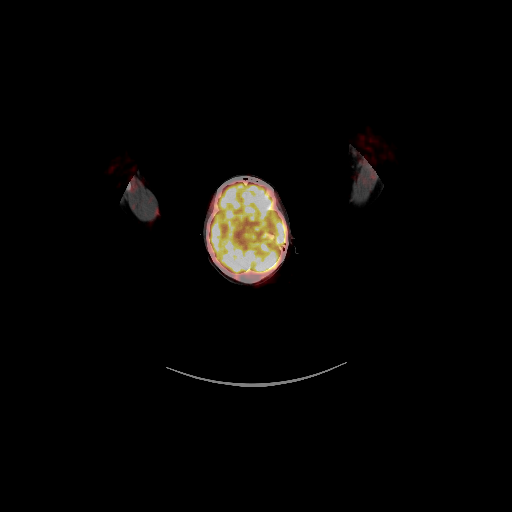

[Series 607: pet ct coronal · 1 of 96 slices shown]
[im 1/96]
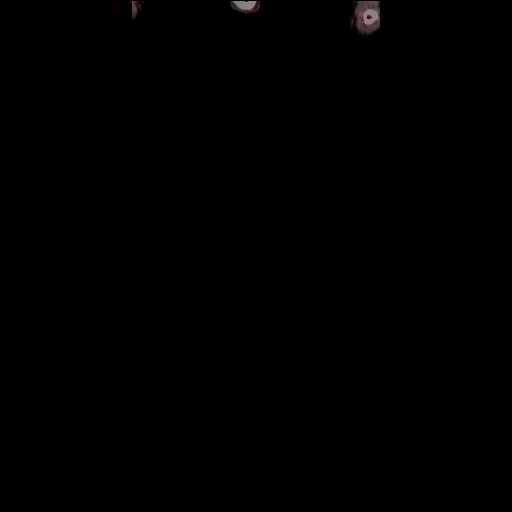

[Series 609: pet ct sagittal · 2 of 129 slices shown]
[im 1/129]
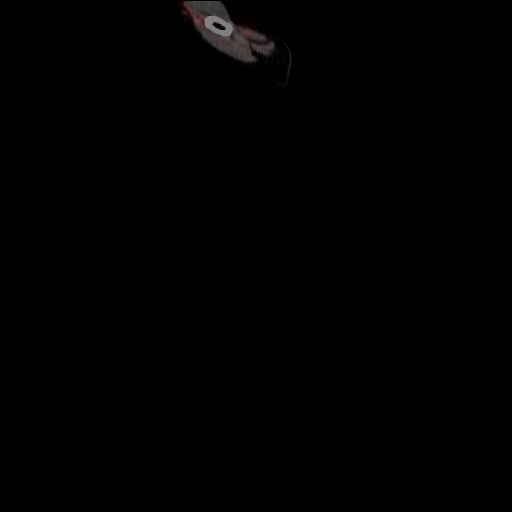
[im 129/129]
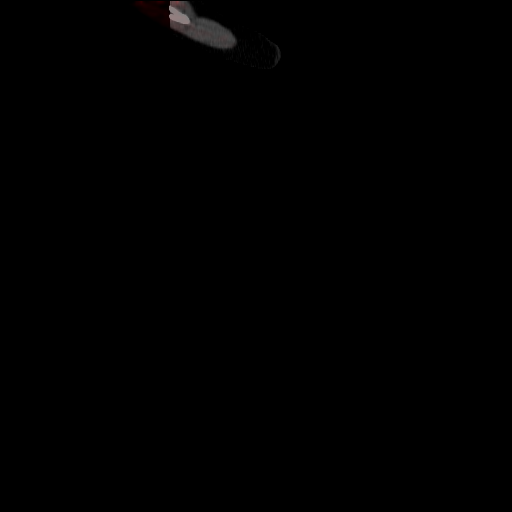

[results mm oncology reading · 1.00mm/px · 1 of 3 slices shown]
[im 1/3]
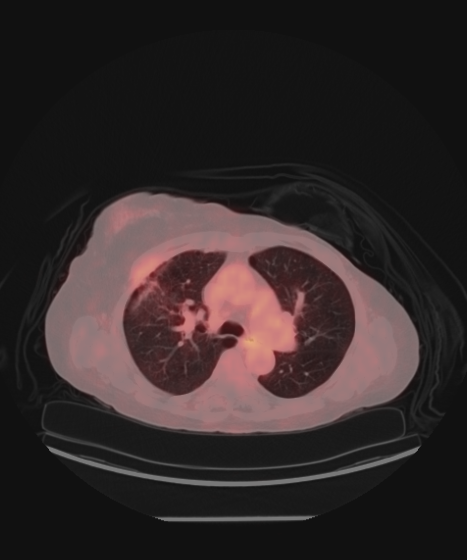

[25 of 25 positions shown; findings below may reference images not displayed]

FINDINGS: NECK

No hypermetabolic lymph nodes in the neck. CT images show no acute
findings.

CHEST

No hypermetabolic mediastinal, hilar or axillary lymph nodes.
Scarring in the apex of the left upper lobe shows no abnormal
hypermetabolism. Subpleural ground-glass in the right upper lobe
does not show abnormal hypermetabolism and is indicative of
radiation therapy. A branching micro nodular lesion in the medial
aspect of the right middle lobe is grossly unchanged (measurement is
difficult given its somewhat amorphous appearance) and does not show
metabolism above blood pool. CT images show no pericardial or
pleural effusion. Three-vessel coronary artery calcification.

ABDOMEN/PELVIS

No abnormal hypermetabolism in the liver, adrenal glands, spleen or
pancreas. No hypermetabolic lymph nodes. CT images show a 7 mm
low-attenuation lesion in the dome of the liver, too small to
characterize. There may be sludge in the gallbladder. Adrenal
glands, kidneys, spleen, pancreas, stomach and bowel are grossly
unremarkable. Bladder wall appears thickened but the bladder is
under distended.

SKELETON

No abnormal osseous hypermetabolism.
IMPRESSION: 1. Posttreatment changes in the right upper lobe without evidence of
residual disease.
2. Branching micronodular lesion in the medial aspect of the right
middle lobe does not show abnormal hypermetabolism. Continued
attention on followup exams is warranted as low-grade adenocarcinoma
cannot be excluded.
3. Three-vessel coronary artery calcification.
4. Question gallbladder sludge.

## 2017-02-09 ENCOUNTER — Emergency Department: Payer: BLUE CROSS/BLUE SHIELD

## 2017-02-09 ENCOUNTER — Observation Stay: Payer: BLUE CROSS/BLUE SHIELD

## 2017-02-09 ENCOUNTER — Encounter: Payer: Self-pay | Admitting: Emergency Medicine

## 2017-02-09 ENCOUNTER — Inpatient Hospital Stay
Admission: EM | Admit: 2017-02-09 | Discharge: 2017-02-11 | DRG: 641 | Disposition: A | Discharge status: Home / Self Care | Payer: BLUE CROSS/BLUE SHIELD | Attending: Internal Medicine | Admitting: Internal Medicine

## 2017-02-09 DIAGNOSIS — Z853 Personal history of malignant neoplasm of breast: Secondary | ICD-10-CM

## 2017-02-09 DIAGNOSIS — Z803 Family history of malignant neoplasm of breast: Secondary | ICD-10-CM

## 2017-02-09 DIAGNOSIS — E861 Hypovolemia: Secondary | ICD-10-CM | POA: Diagnosis present

## 2017-02-09 DIAGNOSIS — E871 Hypo-osmolality and hyponatremia: Principal | ICD-10-CM | POA: Diagnosis present

## 2017-02-09 DIAGNOSIS — E119 Type 2 diabetes mellitus without complications: Secondary | ICD-10-CM | POA: Diagnosis present

## 2017-02-09 DIAGNOSIS — W1830XA Fall on same level, unspecified, initial encounter: Secondary | ICD-10-CM | POA: Diagnosis present

## 2017-02-09 DIAGNOSIS — J449 Chronic obstructive pulmonary disease, unspecified: Secondary | ICD-10-CM | POA: Diagnosis present

## 2017-02-09 DIAGNOSIS — Z87891 Personal history of nicotine dependence: Secondary | ICD-10-CM

## 2017-02-09 DIAGNOSIS — Z9221 Personal history of antineoplastic chemotherapy: Secondary | ICD-10-CM

## 2017-02-09 DIAGNOSIS — S52592A Other fractures of lower end of left radius, initial encounter for closed fracture: Secondary | ICD-10-CM | POA: Diagnosis present

## 2017-02-09 DIAGNOSIS — S62102A Fracture of unspecified carpal bone, left wrist, initial encounter for closed fracture: Secondary | ICD-10-CM

## 2017-02-09 DIAGNOSIS — F329 Major depressive disorder, single episode, unspecified: Secondary | ICD-10-CM | POA: Diagnosis present

## 2017-02-09 DIAGNOSIS — Z85118 Personal history of other malignant neoplasm of bronchus and lung: Secondary | ICD-10-CM

## 2017-02-09 DIAGNOSIS — Z7951 Long term (current) use of inhaled steroids: Secondary | ICD-10-CM

## 2017-02-09 DIAGNOSIS — Z9012 Acquired absence of left breast and nipple: Secondary | ICD-10-CM

## 2017-02-09 DIAGNOSIS — T502X5A Adverse effect of carbonic-anhydrase inhibitors, benzothiadiazides and other diuretics, initial encounter: Secondary | ICD-10-CM | POA: Diagnosis present

## 2017-02-09 DIAGNOSIS — M1990 Unspecified osteoarthritis, unspecified site: Secondary | ICD-10-CM | POA: Diagnosis present

## 2017-02-09 DIAGNOSIS — Z923 Personal history of irradiation: Secondary | ICD-10-CM

## 2017-02-09 DIAGNOSIS — R55 Syncope and collapse: Secondary | ICD-10-CM | POA: Diagnosis not present

## 2017-02-09 DIAGNOSIS — I10 Essential (primary) hypertension: Secondary | ICD-10-CM | POA: Diagnosis present

## 2017-02-09 LAB — URINALYSIS, COMPLETE (UACMP) WITH MICROSCOPIC
BACTERIA UA: NONE SEEN
BILIRUBIN URINE: NEGATIVE
Glucose, UA: NEGATIVE mg/dL
Hgb urine dipstick: NEGATIVE
KETONES UR: NEGATIVE mg/dL
Leukocytes, UA: NEGATIVE
Nitrite: NEGATIVE
PH: 5 (ref 5.0–8.0)
Protein, ur: NEGATIVE mg/dL
Specific Gravity, Urine: 1.003 — ABNORMAL LOW (ref 1.005–1.030)

## 2017-02-09 LAB — CBC WITH DIFFERENTIAL/PLATELET
BASOS ABS: 0.1 10*3/uL (ref 0–0.1)
BASOS PCT: 2 %
EOS PCT: 1 %
Eosinophils Absolute: 0 10*3/uL (ref 0–0.7)
HEMATOCRIT: 31.6 % — AB (ref 35.0–47.0)
Hemoglobin: 10.7 g/dL — ABNORMAL LOW (ref 12.0–16.0)
LYMPHS PCT: 17 %
Lymphs Abs: 0.9 10*3/uL — ABNORMAL LOW (ref 1.0–3.6)
MCH: 32.5 pg (ref 26.0–34.0)
MCHC: 33.8 g/dL (ref 32.0–36.0)
MCV: 96.1 fL (ref 80.0–100.0)
MONO ABS: 0.4 10*3/uL (ref 0.2–0.9)
Monocytes Relative: 7 %
NEUTROS ABS: 4 10*3/uL (ref 1.4–6.5)
Neutrophils Relative %: 73 %
Platelets: 287 10*3/uL (ref 150–440)
RBC: 3.28 MIL/uL — AB (ref 3.80–5.20)
RDW: 13.3 % (ref 11.5–14.5)
WBC: 5.4 10*3/uL (ref 3.6–11.0)

## 2017-02-09 LAB — BASIC METABOLIC PANEL
Anion gap: 10 (ref 5–15)
BUN: 11 mg/dL (ref 6–20)
CHLORIDE: 91 mmol/L — AB (ref 101–111)
CO2: 24 mmol/L (ref 22–32)
CREATININE: 0.79 mg/dL (ref 0.44–1.00)
Calcium: 9.2 mg/dL (ref 8.9–10.3)
GFR calc Af Amer: >60 mL/min (ref >60)
GFR calc non Af Amer: >60 mL/min (ref >60)
Glucose, Bld: 108 mg/dL — ABNORMAL HIGH (ref 65–99)
Potassium: 3.7 mmol/L (ref 3.5–5.1)
SODIUM: 125 mmol/L — AB (ref 135–145)

## 2017-02-09 LAB — TROPONIN I
Troponin I: <0.03 ng/mL (ref <0.03)
Troponin I: <0.03 ng/mL (ref <0.03)

## 2017-02-09 MED ORDER — SODIUM CHLORIDE 0.9 % IV BOLUS (SEPSIS)
1000.0000 mL | Freq: Once | INTRAVENOUS | Status: AC
  Administered 2017-02-09: 1000 mL via INTRAVENOUS

## 2017-02-09 MED ORDER — MORPHINE SULFATE (PF) 4 MG/ML IV SOLN
INTRAVENOUS | Status: AC
  Administered 2017-02-09: 4 mg via INTRAVENOUS
  Filled 2017-02-09: qty 1

## 2017-02-09 MED ORDER — ONDANSETRON HCL 4 MG/2ML IJ SOLN: 4.0000 mg | Freq: Four times a day (QID) | INTRAMUSCULAR | Status: DC | PRN

## 2017-02-09 MED ORDER — MORPHINE SULFATE (PF) 4 MG/ML IV SOLN
4.0000 mg | Freq: Once | INTRAVENOUS | Status: AC
  Administered 2017-02-09: 4 mg via INTRAVENOUS

## 2017-02-09 MED ORDER — AMLODIPINE BESYLATE 10 MG PO TABS
10.0000 mg | ORAL_TABLET | Freq: Every day | ORAL | Status: DC
  Administered 2017-02-09 – 2017-02-10 (×2): 10 mg via ORAL
  Filled 2017-02-09 (×3): qty 1

## 2017-02-09 MED ORDER — CITALOPRAM HYDROBROMIDE 20 MG PO TABS
20.0000 mg | ORAL_TABLET | Freq: Every day | ORAL | Status: DC
  Administered 2017-02-09 – 2017-02-11 (×3): 20 mg via ORAL
  Filled 2017-02-09 (×3): qty 1

## 2017-02-09 MED ORDER — LOSARTAN POTASSIUM 50 MG PO TABS
100.0000 mg | ORAL_TABLET | Freq: Every day | ORAL | Status: DC
  Administered 2017-02-09 – 2017-02-10 (×2): 100 mg via ORAL
  Filled 2017-02-09 (×3): qty 2

## 2017-02-09 MED ORDER — HYDROCHLOROTHIAZIDE 25 MG PO TABS
25.0000 mg | ORAL_TABLET | Freq: Every day | ORAL | Status: DC
  Administered 2017-02-09 – 2017-02-10 (×2): 25 mg via ORAL
  Filled 2017-02-09 (×2): qty 1

## 2017-02-09 MED ORDER — CELECOXIB 200 MG PO CAPS
200.0000 mg | ORAL_CAPSULE | Freq: Every day | ORAL | Status: DC
  Administered 2017-02-09 – 2017-02-11 (×3): 200 mg via ORAL
  Filled 2017-02-09 (×3): qty 1

## 2017-02-09 MED ORDER — MORPHINE SULFATE (PF) 2 MG/ML IV SOLN
2.0000 mg | INTRAVENOUS | Status: DC | PRN
  Administered 2017-02-09 – 2017-02-10 (×4): 2 mg via INTRAVENOUS
  Filled 2017-02-09 (×8): qty 1

## 2017-02-09 MED ORDER — ACETAMINOPHEN 325 MG PO TABS
650.0000 mg | ORAL_TABLET | Freq: Four times a day (QID) | ORAL | Status: DC | PRN
  Administered 2017-02-09 (×2): 650 mg via ORAL
  Filled 2017-02-09 (×2): qty 2

## 2017-02-09 MED ORDER — ACETAMINOPHEN 650 MG RE SUPP: 650.0000 mg | Freq: Four times a day (QID) | RECTAL | Status: DC | PRN

## 2017-02-09 MED ORDER — POTASSIUM CHLORIDE CRYS ER 20 MEQ PO TBCR
20.0000 meq | EXTENDED_RELEASE_TABLET | Freq: Every day | ORAL | Status: DC
  Administered 2017-02-09 – 2017-02-11 (×3): 20 meq via ORAL
  Filled 2017-02-09 (×3): qty 1

## 2017-02-09 MED ORDER — TIOTROPIUM BROMIDE MONOHYDRATE 18 MCG IN CAPS
18.0000 ug | ORAL_CAPSULE | Freq: Every day | RESPIRATORY_TRACT | Status: DC
  Administered 2017-02-09 – 2017-02-11 (×3): 18 ug via RESPIRATORY_TRACT
  Filled 2017-02-09: qty 5

## 2017-02-09 MED ORDER — ONDANSETRON HCL 4 MG/2ML IJ SOLN
INTRAMUSCULAR | Status: AC
  Administered 2017-02-09: 4 mg via INTRAVENOUS
  Filled 2017-02-09: qty 2

## 2017-02-09 MED ORDER — LOSARTAN POTASSIUM-HCTZ 100-25 MG PO TABS: 1.0000 | ORAL_TABLET | Freq: Every day | ORAL | Status: DC

## 2017-02-09 MED ORDER — ENOXAPARIN SODIUM 40 MG/0.4ML ~~LOC~~ SOLN
40.0000 mg | SUBCUTANEOUS | Status: DC
  Administered 2017-02-09 – 2017-02-10 (×2): 40 mg via SUBCUTANEOUS
  Filled 2017-02-09 (×2): qty 0.4

## 2017-02-09 MED ORDER — HYDROCODONE-ACETAMINOPHEN 5-325 MG PO TABS
1.0000 | ORAL_TABLET | ORAL | Status: DC | PRN
  Administered 2017-02-09 – 2017-02-11 (×8): 1 via ORAL
  Filled 2017-02-09 (×8): qty 1

## 2017-02-09 MED ORDER — MELOXICAM 7.5 MG PO TABS: 7.5000 mg | ORAL_TABLET | Freq: Every day | ORAL | Status: DC

## 2017-02-09 MED ORDER — ONDANSETRON HCL 4 MG PO TABS: 4.0000 mg | ORAL_TABLET | Freq: Four times a day (QID) | ORAL | Status: DC | PRN

## 2017-02-09 MED ORDER — MORPHINE SULFATE (PF) 4 MG/ML IV SOLN
4.0000 mg | Freq: Once | INTRAVENOUS | Status: AC
  Administered 2017-02-09: 4 mg via INTRAVENOUS
  Filled 2017-02-09: qty 1

## 2017-02-09 MED ORDER — SODIUM CHLORIDE 0.9 % IV SOLN
INTRAVENOUS | Status: DC
  Administered 2017-02-09 – 2017-02-11 (×4): via INTRAVENOUS

## 2017-02-09 MED ORDER — ONDANSETRON HCL 4 MG/2ML IJ SOLN
4.0000 mg | Freq: Once | INTRAMUSCULAR | Status: AC
  Administered 2017-02-09: 4 mg via INTRAVENOUS

## 2017-02-09 MED ORDER — SODIUM CHLORIDE 0.9% FLUSH
3.0000 mL | Freq: Two times a day (BID) | INTRAVENOUS | Status: DC
  Administered 2017-02-09 – 2017-02-10 (×3): 3 mL via INTRAVENOUS

## 2017-02-09 MED ORDER — MOMETASONE FURO-FORMOTEROL FUM 200-5 MCG/ACT IN AERO
2.0000 | INHALATION_SPRAY | Freq: Two times a day (BID) | RESPIRATORY_TRACT | Status: DC
  Administered 2017-02-09 – 2017-02-11 (×4): 2 via RESPIRATORY_TRACT
  Filled 2017-02-09: qty 8.8

## 2017-02-09 NOTE — ED Notes (Addendum)
Snack and water given to patient at this time. Patient only ate 3 crackers and drank water. Waiting for admitting provider to come see the patient.  Denies any needs at this time. VSS. Respirations equal and unlabored.

## 2017-02-09 NOTE — ED Notes (Signed)
Report to Ashley, RN

## 2017-02-09 NOTE — ED Provider Notes (Signed)
[facility] Emergency Department Provider Note   ____________________________________________   First MD Initiated Contact with Patient 02/09/17 [phone]     (approximate)  I have reviewed the triage vital signs and the nursing notes.   HISTORY  Chief Complaint Wrist Injury and Loss of Consciousness  HPI Danielle [name] is a 61 y.o. female patient reports she was sitting on a stool and went to get up and passed out and fell and broke her wrist. Complains of pain in the left wrist. Normal sensation in the fingertips patient is able to move the fingers somewhat. Patient has good capillary refill in the fingers as well. Patient denies any other injuries she has no chest pain shortness of breath coughing etc. She does have a history of lung cancer and had radiation for it. She has 2 spots she says in her lung as well with these have not changed in size.   Past Medical History:  Diagnosis Date  . Anxiety   . Arthritis   . Breast cancer in female [facility]) 02/21/2016  . Cancer ([place]) left    breast cancer 2000  . COPD (chronic obstructive pulmonary disease) ([place])   . Diabetes mellitus without complication ([place])   . Heart murmur   . Hypertension   . Lung nodule   . Shortness of breath dyspnea    with exertion  . Status post chemotherapy 2001   left breast cancer  . Status post radiation therapy 2001   left breast cancer    Patient Active Problem List   Diagnosis Date Noted  . Lung nodule, solitary 07/25/2016  . Primary cancer of right upper lobe of lung ([place]) 07/25/2016  . Carcinoma of overlapping sites of left breast in female, estrogen receptor positive ([place]) 06/22/2016  . Multiple lung nodules 06/22/2016  . Cough   . Lesion of right lung   . Breast cancer in female [facility]) 02/21/2016  . Lung cancer, upper lobe ([place]) 02/21/2016  . DOE (dyspnea on exertion) 02/14/2016  . Moderate COPD (chronic obstructive pulmonary disease) ([place]) 08/24/2014    Past  Surgical History:  Procedure Laterality Date  . Breast Biospy Left    [facility]  . BREAST SURGERY    . DILATION AND CURETTAGE OF UTERUS    . ELECTROMAGNETIC NAVIGATION BROCHOSCOPY Right 04/11/2016   Procedure: ELECTROMAGNETIC NAVIGATION BRONCHOSCOPY;  Surgeon: Vilinda Boehringer, MD;  Location: [facility] ORS;  Service: Cardiopulmonary;  Laterality: Right;  . LUNG BIOPSY    . MASTECTOMY Left    2000, [facility]  . ROTATOR CUFF REPAIR Right    [facility]    Prior to Admission medications   Medication Sig Start Date End Date Taking? Authorizing Provider  albuterol (PROAIR HFA) 108 (90 BASE) MCG/ACT inhaler Inhale 1-2 puffs into the lungs every 6 (six) hours as needed for wheezing or shortness of breath.  06/28/15   Historical Provider, MD  amLODipine (NORVASC) 10 MG tablet Take 10 mg by mouth daily. for blood pressure 05/21/16   Historical Provider, MD  budesonide-formoterol (SYMBICORT) 160-4.5 MCG/ACT inhaler Inhale 2 puffs into the lungs 2 (two) times daily.    Historical Provider, MD  celecoxib (CELEBREX) 200 MG capsule Take 1 capsule (200 mg total) by mouth daily. 07/23/15   Jenise V Bacon Menshew, PA-C  citalopram (CELEXA) 20 MG tablet Take 20 mg by mouth daily.     Historical Provider, MD  losartan-hydrochlorothiazide (HYZAAR) 100-25 MG tablet Take 1 tablet by mouth daily. 09/28/15   Forest Gleason, MD  meloxicam (  MOBIC) 7.5 MG tablet Take 7.5 mg by mouth daily.  07/04/16   Historical Provider, MD  potassium chloride SA (K-DUR,KLOR-CON) 20 MEQ tablet Take 1 tablet (20 mEq total) by mouth daily. 11/14/15   Forest Gleason, MD  tiotropium (SPIRIVA) 18 MCG inhalation capsule Place into inhaler and inhale. 06/28/15   Historical Provider, MD    Allergies Patient has no known allergies.  Family History  Problem Relation Age of Onset  . Breast cancer Mother 28  . Cancer Mother     Breast   . Breast cancer Paternal Aunt 65  . Cancer Maternal Aunt     Breast     Social History Social History  Substance Use Topics  .  Smoking status: Former Smoker    Packs/day: 0.50    Years: 20.00    Types: Cigarettes    Quit date: 07/02/2012  . Smokeless tobacco: Never Used     Comment: quit 2014  . Alcohol use Yes     Comment: occ    Review of Systems  Constitutional: No fever/chills Eyes: No visual changes. ENT: No sore throat. Cardiovascular: Denies chest pain. Respiratory: Denies shortness of breath. Gastrointestinal: No abdominal pain.  No nausea, no vomiting.  No diarrhea.  No constipation. Genitourinary: Negative for dysuria. Musculoskeletal: Negative for back pain. Skin: Negative for rash. Neurological: Negative for headaches, focal weakness or numbness.   ____________________________________________   PHYSICAL EXAM:  VITAL SIGNS: ED Triage Vitals [02/09/17 0204]  Enc Vitals Group     BP 119/74     Pulse Rate 73     Resp 18     Temp 97.9 F (36.6 C)     Temp Source Oral     SpO2 98 %     Weight 142 lb (64.4 kg)     Height '5\' 6"'$  (1.676 m)     Head Circumference      Peak Flow      Pain Score 10     Pain Loc      Pain Edu?      Excl. in [place]?     Constitutional: Alert and oriented. Well appearing and in no acute distress. Eyes: Conjunctivae are normal. PERRL. EOMI. Head: Atraumatic. Nose: No congestion/rhinnorhea. Mouth/Throat: Mucous membranes are moist.  Oropharynx non-erythematous. Neck: No stridor.  No cervical spine tenderness to palpation. Cardiovascular: Normal rate, regular rhythm. Grossly normal heart sounds.  Good peripheral circulation. Respiratory: Normal respiratory effort.  No retractions. Lungs CTAB. Gastrointestinal: Soft and nontender. No distention. No abdominal bruits. No CVA tenderness. Musculoskeletal: Left wrist is tender and swollen. Patient reports she has some lymphedema there anyway but this much more swelling in the wrist. Neurologic:  Normal speech and language. No gross focal neurologic deficits are appreciated.  Skin:  Skin is warm, dry and intact. No  rash noted. Psychiatric: Mood and affect are normal. Speech and behavior are normal.  ____________________________________________   LABS (all labs ordered are listed, but only abnormal results are displayed)  Labs Reviewed  CBC WITH DIFFERENTIAL/PLATELET - Abnormal; Notable for the following:       Result Value   RBC 3.28 (*)    Hemoglobin 10.7 (*)    HCT 31.6 (*)    Lymphs Abs 0.9 (*)    All other components within normal limits  BASIC METABOLIC PANEL - Abnormal; Notable for the following:    Sodium 125 (*)    Chloride 91 (*)    Glucose, Bld 108 (*)    All other components  within normal limits  TROPONIN I  URINALYSIS, COMPLETE (UACMP) WITH MICROSCOPIC   ____________________________________________  EKG  EKG read and interpreted by me shows sinus rhythm rate of 70 normal axis 1 PVC no acute ST T wave changes there are some U waves and some of the leads. ____________________________________________  RADIOLOGY  CLINICAL DATA: 61 year old female status post fall and left wrist  pain.    EXAM:  LEFT WRIST - COMPLETE 3+ VIEW    COMPARISON: None.    FINDINGS:  There is a comminuted fracture of the distal radius with a  transverse component and a longitudinal component extending to the  articular surface of the distal radius. There is mild dorsal  angulation of the distal fracture fragment. No other acute fracture  identified. There is no dislocation. The bones are osteopenic. There  is osteoarthritic changes of the base of the thumb. There is diffuse  soft tissue swelling of the wrist. No radiopaque foreign object or  soft tissue gas.    IMPRESSION:  Comminuted interarticular fracture of the distal radius with mild  dorsal angulation of the distal fracture fragment. No dislocation.      Electronically Signed  By: Anner Crete M.D.  On: 02/09/2017 02:47    ____________________________________________   PROCEDURES  Procedure(s)  performed:  Procedures  Critical Care performed:   ____________________________________________   INITIAL IMPRESSION / ASSESSMENT AND PLAN / ED COURSE  Pertinent labs & imaging results that were available during my care of the patient were reviewed by me and considered in my medical decision making (see chart for details).        ____________________________________________   FINAL CLINICAL IMPRESSION(S) / ED DIAGNOSES  Final diagnoses:  Syncope and collapse  Hyponatremia  Wrist fracture, closed, left, initial encounter      NEW MEDICATIONS STARTED DURING THIS VISIT:  New Prescriptions   No medications on file     Note:  This document was prepared using Dragon voice recognition software and may include unintentional dictation errors.    Nena Polio, MD 02/09/17 [phone]

## 2017-02-09 NOTE — ED Notes (Signed)
Pt resting in room, eyes closed, Respirations equal and unlabored.

## 2017-02-09 NOTE — ED Triage Notes (Signed)
Pt presents to ED with c/o of syncopal episode and left wrist pain around 0045 tonight. Pt states went from a sitting to a standing position and passed out landing on her left arm. Fall was witnessed by family. Swelling to wrist noted. Ice pack applied. Denies chest pain or sob.

## 2017-02-09 NOTE — ED Provider Notes (Signed)
-----------------------------------------   9:13 AM on 02/09/2017 -----------------------------------------  Seen and evaluated by prior physician, orthopedic surgery is aware of the patient's wrist injury, no further recommendations. He will follows an inpatient. Given patient's second, she will be admitted to the hospitalist service. They will come see her. Workup was done by prior physician. We are continuing pain management pending admission.   Schuyler Amor, MD 02/09/17 [phone]

## 2017-02-09 NOTE — ED Notes (Signed)
MD Malinda at bedside at this time.

## 2017-02-09 NOTE — ED Notes (Signed)
Pt desats to 84% when she is asleep, placed on 2L sats up to 97%

## 2017-02-09 NOTE — ED Notes (Addendum)
Pt lying in bed resting with eyes closed, pt states the pain improved some, but came back in her left wrist.  Elevated on pillows.  Updated her on room status.  Room darkened and door closed. Awaiting ready bed.  Admitting MD already came to room to see patient. Unable to urinate at this time.  Pt went on previous shift, but was not collected for urine sample.

## 2017-02-09 NOTE — H&P (Signed)
[facility] [facility] [place] NAME: Danielle [name]    MR#:  [id number]  DATE OF BIRTH:  1955/11/15  DATE OF ADMISSION:  02/09/2017  PRIMARY CARE PHYSICIAN: [facility]   REQUESTING/REFERRING PHYSICIAN: Dr. Charlotte Crumb  CHIEF COMPLAINT:   Chief Complaint  Patient presents with  . Wrist Injury  . Loss of Consciousness    HISTORY OF PRESENT ILLNESS:  Danielle [name]  is a 61 y.o. female with a known history of anxiety, osteoarthritis, history of breast cancer, COPD, diabetes, hypertension, who presents to the hospital after a syncopal episode and noted to have a left wrist fracture.Patient says that she was in a usual state of health and was out with some friends yesterday when they helped get her up from a chair and she blacked out and fell forward on the floor. Patient denies any prodromal symptoms  Fall like chest pain, shortness of breath, palpitations, nausea, vomiting, headache or any other associated symptoms. Patient does say that 2-3 weeks ago when she got up in the middle of night to go to the bathroom she had a episode of dizziness but did not fall or lose consciousness. Patient does say that she has a poor appetite and eats only one meal a day. She presented to the hospital was noted to be mildly hyponatremicand hospitalist services were contacted further treatment and evaluation.  PAST MEDICAL HISTORY:   Past Medical History:  Diagnosis Date  . Anxiety   . Arthritis   . Breast cancer in female [facility]) 02/21/2016  . Cancer ([place]) left    breast cancer 2000  . COPD (chronic obstructive pulmonary disease) ([place])   . Diabetes mellitus without complication ([place])   . Heart murmur   . Hypertension   . Lung nodule   . Shortness of breath dyspnea    with exertion  . Status post chemotherapy 2001   left breast cancer  . Status post radiation therapy 2001   left breast cancer    PAST SURGICAL HISTORY:   Past Surgical History:   Procedure Laterality Date  . Breast Biospy Left    [facility]  . BREAST SURGERY    . DILATION AND CURETTAGE OF UTERUS    . ELECTROMAGNETIC NAVIGATION BROCHOSCOPY Right 04/11/2016   Procedure: ELECTROMAGNETIC NAVIGATION BRONCHOSCOPY;  Surgeon: Vilinda Boehringer, MD;  Location: [facility] ORS;  Service: Cardiopulmonary;  Laterality: Right;  . LUNG BIOPSY    . MASTECTOMY Left    2000, [facility]  . ROTATOR CUFF REPAIR Right    [facility]    SOCIAL HISTORY:   Social History  Substance Use Topics  . Smoking status: Former Smoker    Packs/day: 0.50    Years: 20.00    Types: Cigarettes    Quit date: 07/02/2012  . Smokeless tobacco: Never Used     Comment: quit 2014  . Alcohol use Yes     Comment: Occasionally    FAMILY HISTORY:   Family History  Problem Relation Age of Onset  . Breast cancer Mother 16  . Cancer Mother     Breast   . Cirrhosis Father   . Breast cancer Paternal Aunt 54  . Cancer Maternal Aunt     Breast     DRUG ALLERGIES:  No Known Allergies  REVIEW OF SYSTEMS:   Review of Systems  Constitutional: Negative for fever and weight loss.  HENT: Negative for congestion, nosebleeds and tinnitus.   Eyes: Negative for blurred vision, double vision and  redness.  Respiratory: Negative for cough, hemoptysis and shortness of breath.   Cardiovascular: Negative for chest pain, orthopnea, leg swelling and PND.  Gastrointestinal: Negative for abdominal pain, diarrhea, melena, nausea and vomiting.  Genitourinary: Negative for dysuria, hematuria and urgency.  Musculoskeletal: Positive for falls and joint pain (Left hand).  Neurological: Negative for dizziness, tingling, sensory change, focal weakness, seizures, weakness and headaches.  Endo/Heme/Allergies: Negative for polydipsia. Does not bruise/bleed easily.  Psychiatric/Behavioral: Negative for depression and memory loss. The patient is not nervous/anxious.     MEDICATIONS AT HOME:   Prior to Admission medications   Medication Sig Start  Date End Date Taking? Authorizing Provider  albuterol (PROAIR HFA) 108 (90 BASE) MCG/ACT inhaler Inhale 1-2 puffs into the lungs every 6 (six) hours as needed for wheezing or shortness of breath.  06/28/15  Yes Historical Provider, MD  amLODipine (NORVASC) 10 MG tablet Take 10 mg by mouth daily. for blood pressure 05/21/16  Yes Historical Provider, MD  budesonide-formoterol (SYMBICORT) 160-4.5 MCG/ACT inhaler Inhale 2 puffs into the lungs 2 (two) times daily.   Yes Historical Provider, MD  celecoxib (CELEBREX) 200 MG capsule Take 1 capsule (200 mg total) by mouth daily. 07/23/15  Yes Jenise V Bacon Menshew, PA-C  citalopram (CELEXA) 20 MG tablet Take 20 mg by mouth daily.    Yes Historical Provider, MD  losartan-hydrochlorothiazide (HYZAAR) 100-25 MG tablet Take 1 tablet by mouth daily. 09/28/15  Yes Forest Gleason, MD  potassium chloride SA (K-DUR,KLOR-CON) 20 MEQ tablet Take 1 tablet (20 mEq total) by mouth daily. 11/14/15  Yes Forest Gleason, MD  tiotropium (SPIRIVA) 18 MCG inhalation capsule Place into inhaler and inhale. 06/28/15  Yes Historical Provider, MD  meloxicam (MOBIC) 7.5 MG tablet Take 7.5 mg by mouth daily.  07/04/16   Historical Provider, MD      VITAL SIGNS:  Blood pressure 98/77, pulse 76, temperature 97.9 F (36.6 C), temperature source Oral, resp. rate 13, height '5\' 6"'$  (1.676 m), weight 64.4 kg (142 lb), SpO2 97 %.  PHYSICAL EXAMINATION:  Physical Exam  GENERAL:  61 y.o.-year-old patient lying in bed in no acute distress.  EYES: Pupils equal, round, reactive to light and accommodation. No scleral icterus. Extraocular muscles intact.  HEENT: Head atraumatic, normocephalic. Oropharynx and nasopharynx clear. No oropharyngeal erythema, moist oral mucosa  NECK:  Supple, no jugular venous distention. No thyroid enlargement, no tenderness.  LUNGS: Normal breath sounds bilaterally, no wheezing, rales, rhonchi. No use of accessory muscles of respiration.  CARDIOVASCULAR: S1, S2 RRR. II/VI  SEM at LSB, rubs, gallops, clicks.  ABDOMEN: Soft, nontender, nondistended. Bowel sounds present. No organomegaly or mass.  EXTREMITIES: No pedal edema, cyanosis, or clubbing. + 2 pedal & radial pulses b/l.   NEUROLOGIC: Cranial nerves II through XII are intact. No focal Motor or sensory deficits appreciated b/l PSYCHIATRIC: The patient is alert and oriented x 3. Good affect.  SKIN: No obvious rash, lesion, or ulcer.   LABORATORY PANEL:   CBC  Recent Labs Lab 02/09/17 0313  WBC 5.4  HGB 10.7*  HCT 31.6*  PLT 287   ------------------------------------------------------------------------------------------------------------------  Chemistries   Recent Labs Lab 02/09/17 0313  NA 125*  K 3.7  CL 91*  CO2 24  GLUCOSE 108*  BUN 11  CREATININE 0.79  CALCIUM 9.2   ------------------------------------------------------------------------------------------------------------------  Cardiac Enzymes  Recent Labs Lab 02/09/17 0313  TROPONINI <0.03   ------------------------------------------------------------------------------------------------------------------  RADIOLOGY:  Dg Wrist Complete Left  Result Date: 02/09/2017 CLINICAL DATA:  61 year old female status  post fall and left wrist pain. EXAM: LEFT WRIST - COMPLETE 3+ VIEW COMPARISON:  None. FINDINGS: There is a comminuted fracture of the distal radius with a transverse component and a longitudinal component extending to the articular surface of the distal radius. There is mild dorsal angulation of the distal fracture fragment. No other acute fracture identified. There is no dislocation. The bones are osteopenic. There is osteoarthritic changes of the base of the thumb. There is diffuse soft tissue swelling of the wrist. No radiopaque foreign object or soft tissue gas. IMPRESSION: Comminuted interarticular fracture of the distal radius with mild dorsal angulation of the distal fracture fragment. No dislocation. Electronically  Signed   By: Anner Crete M.D.   On: 02/09/2017 02:47   Dg Chest Portable 1 View  Result Date: 02/09/2017 CLINICAL DATA:  Syncopal episode this morning. History of LEFT breast cancer. EXAM: PORTABLE CHEST 1 VIEW COMPARISON:  Chest radiograph July 30, 2016 FINDINGS: Cardiac silhouette is upper limits of normal, unchanged. Calcified aortic knob. Scarring RIGHT upper lobe. Similar chronic interstitial changes increased lung volumes without pleural effusion or focal consolidation. No pneumothorax. LEFT mastectomy. Osseous structures are nonsuspicious. IMPRESSION: Stable examination: Borderline cardiomegaly and COPD with RIGHT upper lobe scarring. Electronically Signed   By: Elon Alas M.D.   On: 02/09/2017 05:49     IMPRESSION AND PLAN:   61 year old female with past medical history of diabetes, hypertension, history of breast cancer, osteoarthritis,me COPD, history of anxiety who presented to the hospital due to syncopal episode and noted to have a left hand/wrist fracture.  1. Syncope-etiology unclear but suspected to be vasovagal in nature. We'll observe her on telemetry, cycle her cardiac markers. I will check a two-dimensional echocardiogram, carotid duplex. -We will check orthostatic vital signs.  2. Hyponatremia-hypovolemic hypotonic in nature. We'll gently hydrate with IV fluids and follow sodium.  3. Left distal arm/wrist fracture-secondary to the fall and syncope. Left arm is in a cast. I will consult orthopedics.  4.essential hypertension- continue losartan/HCTZ, Norvasc  5. COPD - no acute exacerbation. Continue Symbicort, Spiriva.  6.Depression-continue Celexa.    All the records are reviewed and case discussed with ED provider. Management plans discussed with the patient, family and they are in agreement.  CODE STATUS: Full Code  TOTAL TIME TAKING CARE OF THIS PATIENT: 40 minutes.    Henreitta Leber M.D on 02/09/2017 at 9:52 AM  Between 7am to 6pm -  Pager - [phone]  After 6pm go to [email or web address] - password [email or web address] [facility]  Office  [phone]  CC: Primary care physician; [place]

## 2017-02-09 NOTE — Consult Note (Signed)
ORTHOPAEDIC CONSULTATION  REQUESTING PHYSICIAN: Henreitta Leber, MD  Chief Complaint: Left wrist pain status post fall  HPI: Danielle [name] is a 61 y.o. female who complains of  left wrist pain after a syncopal episode the from a chair. Patient fell to the floor injuring her left wrist.  Patient was brought to the [facility] regional emergency Department she was found to be hyponatremic.  Patient states that there was no bleeding or skin breakdown after her fall.  Past Medical History:  Diagnosis Date  . Anxiety   . Arthritis   . Breast cancer in female [facility]) 02/21/2016  . Cancer ([place]) left    breast cancer 2000  . COPD (chronic obstructive pulmonary disease) ([place])   . Diabetes mellitus without complication ([place])   . Heart murmur   . Hypertension   . Lung nodule   . Shortness of breath dyspnea    with exertion  . Status post chemotherapy 2001   left breast cancer  . Status post radiation therapy 2001   left breast cancer   Past Surgical History:  Procedure Laterality Date  . Breast Biospy Left    [facility]  . BREAST SURGERY    . DILATION AND CURETTAGE OF UTERUS    . ELECTROMAGNETIC NAVIGATION BROCHOSCOPY Right 04/11/2016   Procedure: ELECTROMAGNETIC NAVIGATION BRONCHOSCOPY;  Surgeon: Vilinda Boehringer, MD;  Location: [facility] ORS;  Service: Cardiopulmonary;  Laterality: Right;  . LUNG BIOPSY    . MASTECTOMY Left    2000, [facility]  . ROTATOR CUFF REPAIR Right    [facility]   Social History   Social History  . Marital status: Divorced    Spouse name: N/A  . Number of children: N/A  . Years of education: N/A   Social History Main Topics  . Smoking status: Former Smoker    Packs/day: 0.50    Years: 20.00    Types: Cigarettes    Quit date: 07/02/2012  . Smokeless tobacco: Never Used     Comment: quit 2014  . Alcohol use Yes     Comment: Occasionally  . Drug use: No  . Sexual activity: No   Other Topics Concern  . None   Social History Narrative  . None   Family History  Problem  Relation Age of Onset  . Breast cancer Mother 67  . Cancer Mother     Breast   . Cirrhosis Father   . Breast cancer Paternal Aunt 40  . Cancer Maternal Aunt     Breast    No Known Allergies Prior to Admission medications   Medication Sig Start Date End Date Taking? Authorizing Provider  albuterol (PROAIR HFA) 108 (90 BASE) MCG/ACT inhaler Inhale 1-2 puffs into the lungs every 6 (six) hours as needed for wheezing or shortness of breath.  06/28/15  Yes Historical Provider, MD  amLODipine (NORVASC) 10 MG tablet Take 10 mg by mouth daily. for blood pressure 05/21/16  Yes Historical Provider, MD  budesonide-formoterol (SYMBICORT) 160-4.5 MCG/ACT inhaler Inhale 2 puffs into the lungs 2 (two) times daily.   Yes Historical Provider, MD  celecoxib (CELEBREX) 200 MG capsule Take 1 capsule (200 mg total) by mouth daily. 07/23/15  Yes Jenise V Bacon Menshew, PA-C  citalopram (CELEXA) 20 MG tablet Take 20 mg by mouth daily.    Yes Historical Provider, MD  losartan-hydrochlorothiazide (HYZAAR) 100-25 MG tablet Take 1 tablet by mouth daily. 09/28/15  Yes Forest Gleason, MD  potassium chloride SA (K-DUR,KLOR-CON) 20 MEQ tablet Take 1 tablet (20  mEq total) by mouth daily. 11/14/15  Yes Forest Gleason, MD  tiotropium (SPIRIVA) 18 MCG inhalation capsule Place into inhaler and inhale. 06/28/15  Yes Historical Provider, MD  meloxicam (MOBIC) 7.5 MG tablet Take 7.5 mg by mouth daily.  07/04/16   Historical Provider, MD   Dg Wrist Complete Left  Result Date: 02/09/2017 CLINICAL DATA:  61 year old female status post fall and left wrist pain. EXAM: LEFT WRIST - COMPLETE 3+ VIEW COMPARISON:  None. FINDINGS: There is a comminuted fracture of the distal radius with a transverse component and a longitudinal component extending to the articular surface of the distal radius. There is mild dorsal angulation of the distal fracture fragment. No other acute fracture identified. There is no dislocation. The bones are osteopenic. There is  osteoarthritic changes of the base of the thumb. There is diffuse soft tissue swelling of the wrist. No radiopaque foreign object or soft tissue gas. IMPRESSION: Comminuted interarticular fracture of the distal radius with mild dorsal angulation of the distal fracture fragment. No dislocation. Electronically Signed   By: Anner Crete M.D.   On: 02/09/2017 02:47   Dg Chest Portable 1 View  Result Date: 02/09/2017 CLINICAL DATA:  Syncopal episode this morning. History of LEFT breast cancer. EXAM: PORTABLE CHEST 1 VIEW COMPARISON:  Chest radiograph July 30, 2016 FINDINGS: Cardiac silhouette is upper limits of normal, unchanged. Calcified aortic knob. Scarring RIGHT upper lobe. Similar chronic interstitial changes increased lung volumes without pleural effusion or focal consolidation. No pneumothorax. LEFT mastectomy. Osseous structures are nonsuspicious. IMPRESSION: Stable examination: Borderline cardiomegaly and COPD with RIGHT upper lobe scarring. Electronically Signed   By: Elon Alas M.D.   On: 02/09/2017 05:49    Positive ROS: All other systems have been reviewed and were otherwise negative with the exception of those mentioned in the HPI and as above.  Physical Exam: General: Alert, no acute distress.  Patient is lying in her hospital bed. Her left wrist is in a splint placed by the ER staff.  MUSCULOSKELETAL: Left upper extremity: Patient's forearm is splinted and overwrapped with an Ace wrap. There is no obvious deformity. She can flex and extend all 5 digits of the left hand. Her fingers are well-perfused. She has intact sensation light touch but states she has mild paresthesias in her hand.   Assessment: Left distal radius fracture, closed  Plan: I reviewed the patient's x-rays. She has a fracture involving the distal radius with intra-articular extension. However the patient has not lost significant radial height and does not have any significant angulation. She has a couple  of millimeters of dorsal translation of the distal fragment appreciated on the lateral view. Overall the fracture is in acceptable position. I recommend continued splinting of the left forearm. The patient should not weight-bear on the left upper extremity. She may be given a sling for comfort. Patient will follow up in my office in approximately a week for reevaluation and x-ray. I splinted the patient that if her fracture displaces over the next week she may require surgical intervention. However if she maintains her current fracture position upon follow-up then her fracture will likely be treated nonoperatively in a cast. Patient understood and agreed with this plan.    Thornton Park, MD    02/09/2017 1:39 PM

## 2017-02-10 ENCOUNTER — Observation Stay (HOSPITAL_BASED_OUTPATIENT_CLINIC_OR_DEPARTMENT_OTHER)
Admit: 2017-02-10 | Discharge: 2017-02-10 | Disposition: A | Discharge status: Home / Self Care | Payer: BLUE CROSS/BLUE SHIELD | Attending: Specialist | Admitting: Specialist

## 2017-02-10 DIAGNOSIS — Z7951 Long term (current) use of inhaled steroids: Secondary | ICD-10-CM | POA: Diagnosis not present

## 2017-02-10 DIAGNOSIS — Z87891 Personal history of nicotine dependence: Secondary | ICD-10-CM | POA: Diagnosis not present

## 2017-02-10 DIAGNOSIS — Z9012 Acquired absence of left breast and nipple: Secondary | ICD-10-CM | POA: Diagnosis not present

## 2017-02-10 DIAGNOSIS — F329 Major depressive disorder, single episode, unspecified: Secondary | ICD-10-CM | POA: Diagnosis present

## 2017-02-10 DIAGNOSIS — S52592A Other fractures of lower end of left radius, initial encounter for closed fracture: Secondary | ICD-10-CM | POA: Diagnosis present

## 2017-02-10 DIAGNOSIS — I5189 Other ill-defined heart diseases: Secondary | ICD-10-CM

## 2017-02-10 DIAGNOSIS — I10 Essential (primary) hypertension: Secondary | ICD-10-CM | POA: Diagnosis present

## 2017-02-10 DIAGNOSIS — J449 Chronic obstructive pulmonary disease, unspecified: Secondary | ICD-10-CM | POA: Diagnosis present

## 2017-02-10 DIAGNOSIS — Z85118 Personal history of other malignant neoplasm of bronchus and lung: Secondary | ICD-10-CM | POA: Diagnosis not present

## 2017-02-10 DIAGNOSIS — W1830XA Fall on same level, unspecified, initial encounter: Secondary | ICD-10-CM | POA: Diagnosis present

## 2017-02-10 DIAGNOSIS — M1990 Unspecified osteoarthritis, unspecified site: Secondary | ICD-10-CM | POA: Diagnosis present

## 2017-02-10 DIAGNOSIS — Z923 Personal history of irradiation: Secondary | ICD-10-CM | POA: Diagnosis not present

## 2017-02-10 DIAGNOSIS — E119 Type 2 diabetes mellitus without complications: Secondary | ICD-10-CM | POA: Diagnosis present

## 2017-02-10 DIAGNOSIS — I5030 Unspecified diastolic (congestive) heart failure: Secondary | ICD-10-CM | POA: Diagnosis not present

## 2017-02-10 DIAGNOSIS — Z9221 Personal history of antineoplastic chemotherapy: Secondary | ICD-10-CM | POA: Diagnosis not present

## 2017-02-10 DIAGNOSIS — E861 Hypovolemia: Secondary | ICD-10-CM | POA: Diagnosis present

## 2017-02-10 DIAGNOSIS — T502X5A Adverse effect of carbonic-anhydrase inhibitors, benzothiadiazides and other diuretics, initial encounter: Secondary | ICD-10-CM | POA: Diagnosis present

## 2017-02-10 DIAGNOSIS — Z803 Family history of malignant neoplasm of breast: Secondary | ICD-10-CM | POA: Diagnosis not present

## 2017-02-10 DIAGNOSIS — R55 Syncope and collapse: Secondary | ICD-10-CM | POA: Diagnosis present

## 2017-02-10 DIAGNOSIS — E871 Hypo-osmolality and hyponatremia: Secondary | ICD-10-CM | POA: Diagnosis present

## 2017-02-10 DIAGNOSIS — Z853 Personal history of malignant neoplasm of breast: Secondary | ICD-10-CM | POA: Diagnosis not present

## 2017-02-10 HISTORY — DX: Other ill-defined heart diseases: I51.89

## 2017-02-10 LAB — BASIC METABOLIC PANEL
Anion gap: 8 (ref 5–15)
BUN: 10 mg/dL (ref 6–20)
CALCIUM: 8.3 mg/dL — AB (ref 8.9–10.3)
CHLORIDE: 94 mmol/L — AB (ref 101–111)
CO2: 25 mmol/L (ref 22–32)
CREATININE: 0.79 mg/dL (ref 0.44–1.00)
GFR calc Af Amer: >60 mL/min (ref >60)
Glucose, Bld: 108 mg/dL — ABNORMAL HIGH (ref 65–99)
Potassium: 4 mmol/L (ref 3.5–5.1)
SODIUM: 127 mmol/L — AB (ref 135–145)

## 2017-02-10 LAB — CBC
HEMATOCRIT: 27.4 % — AB (ref 35.0–47.0)
Hemoglobin: 9.1 g/dL — ABNORMAL LOW (ref 12.0–16.0)
MCH: 32.6 pg (ref 26.0–34.0)
MCHC: 33.3 g/dL (ref 32.0–36.0)
MCV: 97.9 fL (ref 80.0–100.0)
PLATELETS: 235 10*3/uL (ref 150–440)
RBC: 2.8 MIL/uL — ABNORMAL LOW (ref 3.80–5.20)
RDW: 13.5 % (ref 11.5–14.5)
WBC: 4.2 10*3/uL (ref 3.6–11.0)

## 2017-02-10 LAB — ECHOCARDIOGRAM COMPLETE
Height: 66 in
WEIGHTICAEL: 2272 [oz_av]

## 2017-02-10 LAB — SODIUM: Sodium: 126 mmol/L — ABNORMAL LOW (ref 135–145)

## 2017-02-10 LAB — OSMOLALITY: Osmolality: 264 mOsm/kg — ABNORMAL LOW (ref 275–295)

## 2017-02-10 LAB — OSMOLALITY, URINE: Osmolality, Ur: 147 mOsm/kg — ABNORMAL LOW (ref 300–900)

## 2017-02-10 LAB — SODIUM, URINE, RANDOM: SODIUM UR: 43 mmol/L

## 2017-02-10 MED ORDER — HYDROCODONE-ACETAMINOPHEN 5-325 MG PO TABS: 1.0000 | ORAL_TABLET | ORAL | 0 refills | Status: DC | PRN

## 2017-02-10 NOTE — Progress Notes (Signed)
Pts fingers to left hand are warm with brisk capillary refill, pt is able to move all digits.

## 2017-02-10 NOTE — Progress Notes (Signed)
Subjective:  Patient reports pain as mild.  Lying in bed with her left arm propped on pillows and an ice pack on her left wrist.  Objective:   VITALS:   Vitals:   02/09/17 1030 02/09/17 1126 02/09/17 1957 02/10/17 0535  BP: 116/75 124/71 118/65 123/73  Pulse: 71 79 91 80  Resp: '15 20 16 16  '$ Temp:  98.4 F (36.9 C) 98.5 F (36.9 C) 98.3 F (36.8 C)  TempSrc:  Oral Oral Oral  SpO2: 100% 97% 92% 94%  Weight:      Height:        PHYSICAL EXAM:  Left upper extremity:  Splint and dressing remain in place and intact. Neurovascular intact Sensation intact distally  LABS  Results for orders placed or performed during the hospital encounter of 02/09/17 (from the past 24 hour(s))  Troponin I     Status: None   Collection Time: 02/09/17  7:12 PM  Result Value Ref Range   Troponin I <0.03 <0.03 ng/mL  Basic metabolic panel     Status: Abnormal   Collection Time: 02/10/17  7:39 AM  Result Value Ref Range   Sodium 127 (L) 135 - 145 mmol/L   Potassium 4.0 3.5 - 5.1 mmol/L   Chloride 94 (L) 101 - 111 mmol/L   CO2 25 22 - 32 mmol/L   Glucose, Bld 108 (H) 65 - 99 mg/dL   BUN 10 6 - 20 mg/dL   Creatinine, Ser 0.79 0.44 - 1.00 mg/dL   Calcium 8.3 (L) 8.9 - 10.3 mg/dL   GFR calc non Af Amer >60 >60 mL/min   GFR calc Af Amer >60 >60 mL/min   Anion gap 8 5 - 15  CBC     Status: Abnormal   Collection Time: 02/10/17  7:39 AM  Result Value Ref Range   WBC 4.2 3.6 - 11.0 K/uL   RBC 2.80 (L) 3.80 - 5.20 MIL/uL   Hemoglobin 9.1 (L) 12.0 - 16.0 g/dL   HCT 27.4 (L) 35.0 - 47.0 %   MCV 97.9 80.0 - 100.0 fL   MCH 32.6 26.0 - 34.0 pg   MCHC 33.3 32.0 - 36.0 g/dL   RDW 13.5 11.5 - 14.5 %   Platelets 235 150 - 440 K/uL  Sodium     Status: Abnormal   Collection Time: 02/10/17 12:55 PM  Result Value Ref Range   Sodium 126 (L) 135 - 145 mmol/L    Dg Wrist Complete Left  Result Date: 02/09/2017 CLINICAL DATA:  61 year old female status post fall and left wrist pain. EXAM: LEFT WRIST -  COMPLETE 3+ VIEW COMPARISON:  None. FINDINGS: There is a comminuted fracture of the distal radius with a transverse component and a longitudinal component extending to the articular surface of the distal radius. There is mild dorsal angulation of the distal fracture fragment. No other acute fracture identified. There is no dislocation. The bones are osteopenic. There is osteoarthritic changes of the base of the thumb. There is diffuse soft tissue swelling of the wrist. No radiopaque foreign object or soft tissue gas. IMPRESSION: Comminuted interarticular fracture of the distal radius with mild dorsal angulation of the distal fracture fragment. No dislocation. Electronically Signed   By: Anner Crete M.D.   On: 02/09/2017 02:47   US Carotid Bilateral  Result Date: 02/09/2017 CLINICAL DATA:  Syncope. EXAM: BILATERAL CAROTID DUPLEX ULTRASOUND TECHNIQUE: Pearline Cables scale imaging, color Doppler and duplex ultrasound were performed of bilateral carotid and vertebral arteries in the neck.  COMPARISON:  07/03/2016 M FINDINGS: Criteria: Quantification of carotid stenosis is based on velocity parameters that correlate the residual internal carotid diameter with NASCET-based stenosis levels, using the diameter of the distal internal carotid lumen as the denominator for stenosis measurement. The following velocity measurements were obtained: RIGHT ICA:  120 cm/sec CCA:  664 cm/sec SYSTOLIC ICA/CCA RATIO:  1.2 DIASTOLIC ICA/CCA RATIO:  2.0 ECA:  83 cm/sec LEFT ICA:  88 cm/sec CCA:  85 cm/sec SYSTOLIC ICA/CCA RATIO:  1.0 DIASTOLIC ICA/CCA RATIO:  1.9 ECA:  88 cm/sec RIGHT CAROTID ARTERY: Echogenic plaque at the right carotid bulb. External carotid artery is patent with normal waveform. Normal waveforms and velocities in the internal carotid artery. Visualization of the internal carotid artery is limited on this examination. RIGHT VERTEBRAL ARTERY: Antegrade flow and normal waveform in the right vertebral artery. LEFT CAROTID  ARTERY: Echogenic plaque at the left carotid bulb. External carotid artery is patent with normal waveform. Normal waveforms and velocities in the internal carotid artery. LEFT VERTEBRAL ARTERY: Antegrade flow and normal waveform in the left vertebral artery. IMPRESSION: Atherosclerotic disease at the carotid bulbs bilaterally. Estimated degree of stenosis in the internal carotid arteries is less than 50% bilaterally. Patent vertebral arteries. Electronically Signed   By: Markus Daft M.D.   On: 02/09/2017 14:18   Dg Chest Portable 1 View  Result Date: 02/09/2017 CLINICAL DATA:  Syncopal episode this morning. History of LEFT breast cancer. EXAM: PORTABLE CHEST 1 VIEW COMPARISON:  Chest radiograph July 30, 2016 FINDINGS: Cardiac silhouette is upper limits of normal, unchanged. Calcified aortic knob. Scarring RIGHT upper lobe. Similar chronic interstitial changes increased lung volumes without pleural effusion or focal consolidation. No pneumothorax. LEFT mastectomy. Osseous structures are nonsuspicious. IMPRESSION: Stable examination: Borderline cardiomegaly and COPD with RIGHT upper lobe scarring. Electronically Signed   By: Elon Alas M.D.   On: 02/09/2017 05:49    Assessment/Plan:     Active Problems:   Syncope  Patient doing well from an orthopedic standpoint. She will continue her wearing her splint. Patient to continue ice and elevation of the left upper extremity to reduce swelling. She may follow up with me in 1 week following discharge for reevaluation and x-ray in my office. Patient should be nonweightbearing on the left upper extremity to avoid displacement of her distal radius fracture.  Patient understood and agreed with this plan.    Thornton Park , MD 02/10/2017, 1:48 PM

## 2017-02-10 NOTE — Progress Notes (Signed)
[facility] at [place] NAME: Danielle [name]    MR#:  [id number]  DATE OF BIRTH:  December 25, 1955  SUBJECTIVE:   Patient here with a syncopal episode and noted to have a left wrist/hand fracture. Noted to be hyponatremic. No acute events overnight or episodes of syncope. Sodium still remains low but patient is asymptomatic.    REVIEW OF SYSTEMS:    Review of Systems  Constitutional: Negative for chills and fever.  HENT: Negative for congestion and tinnitus.   Eyes: Negative for blurred vision and double vision.  Respiratory: Negative for cough, shortness of breath and wheezing.   Cardiovascular: Negative for chest pain, orthopnea and PND.  Gastrointestinal: Negative for abdominal pain, diarrhea, nausea and vomiting.  Genitourinary: Negative for dysuria and hematuria.  Neurological: Negative for dizziness, sensory change and focal weakness.  All other systems reviewed and are negative.   Nutrition: heart healthy Tolerating Diet: Yes Tolerating PT: Ambulatory     DRUG ALLERGIES:  No Known Allergies  VITALS:  Blood pressure 123/73, pulse 80, temperature 98.3 F (36.8 C), temperature source Oral, resp. rate 16, height '5\' 6"'$  (1.676 m), weight 64.4 kg (142 lb), SpO2 94 %.  PHYSICAL EXAMINATION:   Physical Exam  GENERAL:  61 y.o.-year-old patient lying in bed in no acute distress.  EYES: Pupils equal, round, reactive to light and accommodation. No scleral icterus. Extraocular muscles intact.  HEENT: Head atraumatic, normocephalic. Oropharynx and nasopharynx clear.  NECK:  Supple, no jugular venous distention. No thyroid enlargement, no tenderness.  LUNGS: Normal breath sounds bilaterally, no wheezing, rales, rhonchi. No use of accessory muscles of respiration.  CARDIOVASCULAR: S1, S2 normal. No murmurs, rubs, or gallops.  ABDOMEN: Soft, nontender, nondistended. Bowel sounds present. No organomegaly or mass.  EXTREMITIES: No cyanosis, clubbing  or edema b/l.   Left upper ext. In cast.   NEUROLOGIC: Cranial nerves II through XII are intact. No focal Motor or sensory deficits b/l.   PSYCHIATRIC: The patient is alert and oriented x 3.  SKIN: No obvious rash, lesion, or ulcer.    LABORATORY PANEL:   CBC  Recent Labs Lab 02/10/17 0739  WBC 4.2  HGB 9.1*  HCT 27.4*  PLT 235   ------------------------------------------------------------------------------------------------------------------  Chemistries   Recent Labs Lab 02/10/17 0739 02/10/17 1255  NA 127* 126*  K 4.0  --   CL 94*  --   CO2 25  --   GLUCOSE 108*  --   BUN 10  --   CREATININE 0.79  --   CALCIUM 8.3*  --    ------------------------------------------------------------------------------------------------------------------  Cardiac Enzymes  Recent Labs Lab 02/09/17 1912  TROPONINI <0.03   ------------------------------------------------------------------------------------------------------------------  RADIOLOGY:  Dg Wrist Complete Left  Result Date: 02/09/2017 CLINICAL DATA:  61 year old female status post fall and left wrist pain. EXAM: LEFT WRIST - COMPLETE 3+ VIEW COMPARISON:  None. FINDINGS: There is a comminuted fracture of the distal radius with a transverse component and a longitudinal component extending to the articular surface of the distal radius. There is mild dorsal angulation of the distal fracture fragment. No other acute fracture identified. There is no dislocation. The bones are osteopenic. There is osteoarthritic changes of the base of the thumb. There is diffuse soft tissue swelling of the wrist. No radiopaque foreign object or soft tissue gas. IMPRESSION: Comminuted interarticular fracture of the distal radius with mild dorsal angulation of the distal fracture fragment. No dislocation. Electronically Signed   By: Laren Everts.D.  On: 02/09/2017 02:47   US Carotid Bilateral  Result Date: 02/09/2017 CLINICAL DATA:  Syncope.  EXAM: BILATERAL CAROTID DUPLEX ULTRASOUND TECHNIQUE: Pearline Cables scale imaging, color Doppler and duplex ultrasound were performed of bilateral carotid and vertebral arteries in the neck. COMPARISON:  07/03/2016 M FINDINGS: Criteria: Quantification of carotid stenosis is based on velocity parameters that correlate the residual internal carotid diameter with NASCET-based stenosis levels, using the diameter of the distal internal carotid lumen as the denominator for stenosis measurement. The following velocity measurements were obtained: RIGHT ICA:  120 cm/sec CCA:  734 cm/sec SYSTOLIC ICA/CCA RATIO:  1.2 DIASTOLIC ICA/CCA RATIO:  2.0 ECA:  83 cm/sec LEFT ICA:  88 cm/sec CCA:  85 cm/sec SYSTOLIC ICA/CCA RATIO:  1.0 DIASTOLIC ICA/CCA RATIO:  1.9 ECA:  88 cm/sec RIGHT CAROTID ARTERY: Echogenic plaque at the right carotid bulb. External carotid artery is patent with normal waveform. Normal waveforms and velocities in the internal carotid artery. Visualization of the internal carotid artery is limited on this examination. RIGHT VERTEBRAL ARTERY: Antegrade flow and normal waveform in the right vertebral artery. LEFT CAROTID ARTERY: Echogenic plaque at the left carotid bulb. External carotid artery is patent with normal waveform. Normal waveforms and velocities in the internal carotid artery. LEFT VERTEBRAL ARTERY: Antegrade flow and normal waveform in the left vertebral artery. IMPRESSION: Atherosclerotic disease at the carotid bulbs bilaterally. Estimated degree of stenosis in the internal carotid arteries is less than 50% bilaterally. Patent vertebral arteries. Electronically Signed   By: Markus Daft M.D.   On: 02/09/2017 14:18   Dg Chest Portable 1 View  Result Date: 02/09/2017 CLINICAL DATA:  Syncopal episode this morning. History of LEFT breast cancer. EXAM: PORTABLE CHEST 1 VIEW COMPARISON:  Chest radiograph July 30, 2016 FINDINGS: Cardiac silhouette is upper limits of normal, unchanged. Calcified aortic knob.  Scarring RIGHT upper lobe. Similar chronic interstitial changes increased lung volumes without pleural effusion or focal consolidation. No pneumothorax. LEFT mastectomy. Osseous structures are nonsuspicious. IMPRESSION: Stable examination: Borderline cardiomegaly and COPD with RIGHT upper lobe scarring. Electronically Signed   By: Elon Alas M.D.   On: 02/09/2017 05:49     ASSESSMENT AND PLAN:   61 year old female with past medical history of diabetes, hypertension, history of breast cancer, osteoarthritis,me COPD, history of anxiety who presented to the hospital due to syncopal episode and noted to have a left hand/wrist fracture.  1. Syncope- vasovagal in nature.  - Patient has been observed on telemetry, cardiac markers 3 have been negative. No alarms and telemetry. -Carotid duplex negative for hemodynamically significant carotid stenosis, echocardiogram shows normal ejection fraction and no acute abnormalities. -We'll recheck orthostatic vital signs today.  2. Hyponatremia-hypovolemic hypotonic in nature. Sodium still down to 126 today. -I will check a urine sodium, urine, serum osmolality. Rate is IV fluid rate. Questionable SIADH, if not improving consider nephrology consult. - will d/c HCTZ  3. Left distal arm/wrist fracture-secondary to the fall and syncope. --Seen by orthopedics, left arm is in a cast. Follow-up with them as an outpatient. Continue IV morphine, Norco for pain.  4.essential hypertension- continue losartan, Norvasc. D/c HCTZ due to Hyponatremia.   5. COPD - no acute exacerbation. Continue Symbicort, Spiriva.  6. Depression-continue Celexa.   All the records are reviewed and case discussed with Care Management/Social Worker. Management plans discussed with the patient, family and they are in agreement.  CODE STATUS: full code  DVT Prophylaxis: Lovenox  TOTAL TIME TAKING CARE OF THIS PATIENT: 30 minutes.   POSSIBLE D/C  IN 1-2 DAYS, DEPENDING ON  CLINICAL CONDITION.   Henreitta Leber M.D on 02/10/2017 at 1:38 PM  Between 7am to 6pm - Pager - [phone]  After 6pm go to [email or web address] - [facility]  Office  [phone]  CC: Primary care physician; [place]

## 2017-02-10 NOTE — Progress Notes (Signed)
Pt. Requested pain medication q2h for fractured left wrist with relief in her pain. Left arm was elevated and  An ice pack was used throughout the night. Pt. Walked around nurses station down to elevator with stand by assist. No c/o SOB or acute distress noted.

## 2017-02-11 LAB — SODIUM: SODIUM: 132 mmol/L — AB (ref 135–145)

## 2017-02-11 MED ORDER — LOSARTAN POTASSIUM 100 MG PO TABS: 100.0000 mg | ORAL_TABLET | Freq: Every day | ORAL | 0 refills | Status: DC

## 2017-02-11 NOTE — Discharge Summary (Signed)
[facility] at [place] NAME: Danielle [name]    MR#:  [id number]  DATE OF BIRTH:  1956-08-01  DATE OF ADMISSION:  02/09/2017 ADMITTING PHYSICIAN: Henreitta Leber, MD  DATE OF DISCHARGE: 02/11/2017  PRIMARY CARE PHYSICIAN: [place]    ADMISSION DIAGNOSIS:  Syncope and collapse [R55] Hyponatremia [E87.1] Wrist fracture, closed, left, initial encounter [S62.102A]  DISCHARGE DIAGNOSIS:  Active Problems:   Syncope   Hyponatremia   SECONDARY DIAGNOSIS:   Past Medical History:  Diagnosis Date  . Anxiety   . Arthritis   . Breast cancer in female [facility]) 02/21/2016  . Cancer ([place]) left    breast cancer 2000  . COPD (chronic obstructive pulmonary disease) ([place])   . Diabetes mellitus without complication ([place])   . Heart murmur   . Hypertension   . Lung nodule   . Shortness of breath dyspnea    with exertion  . Status post chemotherapy 2001   left breast cancer  . Status post radiation therapy 2001   left breast cancer    HOSPITAL COURSE:   61 year old female with past medical history of diabetes, hypertension, history of breast cancer, osteoarthritis,me COPD, history of anxiety who presented to the hospital due to syncopal episode and noted to have a left hand/wrist fracture.  1. Syncope- vasovagal in nature.  - Patient has been observed on telemetry, cardiac markers 3 have been negative. No alarms and telemetry. -Carotid duplex negative for hemodynamically significant carotid stenosis, echocardiogram shows normal ejection fraction and no acute abnormalities.  2. Hyponatremia-hypovolemic hypotonic in nature. Sodium was 125- 126 , now 132. - Improved with IV fluids, Likely was due to [facility] use.   Stopped HCTz on discharge.  3. Left distal arm/wrist fracture-secondary to the fall and syncope. --Seen by orthopedics, left arm is in a cast. Follow-up with them as an outpatient. Continue IV morphine, Norco for  pain.  4.essential hypertension- continue losartan, Norvasc. D/c HCTZ due to Hyponatremia.   5. COPD - no acute exacerbation. Continue Symbicort, Spiriva.  6. Depression-continue Celexa.  DISCHARGE CONDITIONS:   Stable.  CONSULTS OBTAINED:  Treatment Team:  Thornton Park, MD  DRUG ALLERGIES:  No Known Allergies  DISCHARGE MEDICATIONS:   Current Discharge Medication List    START taking these medications   Details  HYDROcodone-acetaminophen (NORCO/VICODIN) 5-325 MG tablet Take 1 tablet by mouth every 4 (four) hours as needed for moderate pain. Qty: 30 tablet, Refills: 0    losartan (COZAAR) 100 MG tablet Take 1 tablet (100 mg total) by mouth daily. Qty: 30 tablet, Refills: 0      CONTINUE these medications which have NOT CHANGED   Details  albuterol (PROAIR HFA) 108 (90 BASE) MCG/ACT inhaler Inhale 1-2 puffs into the lungs every 6 (six) hours as needed for wheezing or shortness of breath.    Associated Diagnoses: Malignant neoplasm of lung, unspecified laterality, unspecified part of lung ([place]); Lung mass    amLODipine (NORVASC) 10 MG tablet Take 10 mg by mouth daily. for blood pressure Refills: 3    budesonide-formoterol (SYMBICORT) 160-4.5 MCG/ACT inhaler Inhale 2 puffs into the lungs 2 (two) times daily.    celecoxib (CELEBREX) 200 MG capsule Take 1 capsule (200 mg total) by mouth daily. Qty: 30 capsule, Refills: 0    citalopram (CELEXA) 20 MG tablet Take 20 mg by mouth daily.    Associated Diagnoses: Malignant neoplasm of lung, unspecified laterality, unspecified part of lung ([place]); Lung mass  potassium chloride SA (K-DUR,KLOR-CON) 20 MEQ tablet Take 1 tablet (20 mEq total) by mouth daily. Qty: 30 tablet, Refills: 6   Associated Diagnoses: Malignant neoplasm of upper lobe of lung, unspecified laterality ([place]); Other emphysema ([place]); SOB (shortness of breath)    tiotropium (SPIRIVA) 18 MCG inhalation capsule Place into inhaler and inhale.    meloxicam  (MOBIC) 7.5 MG tablet Take 7.5 mg by mouth daily.       STOP taking these medications     losartan-hydrochlorothiazide (HYZAAR) 100-25 MG tablet          DISCHARGE INSTRUCTIONS:    Follow with PMD in 1 week.  If you experience worsening of your admission symptoms, develop shortness of breath, life threatening emergency, suicidal or homicidal thoughts you must seek medical attention immediately by calling 911 or calling your MD immediately  if symptoms less severe.  You Must read complete instructions/literature along with all the possible adverse reactions/side effects for all the Medicines you take and that have been prescribed to you. Take any new Medicines after you have completely understood and accept all the possible adverse reactions/side effects.   Please note  You were cared for by a hospitalist during your hospital stay. If you have any questions about your discharge medications or the care you received while you were in the hospital after you are discharged, you can call the unit and asked to speak with the hospitalist on call if the hospitalist that took care of you is not available. Once you are discharged, your primary care physician will handle any further medical issues. Please note that NO REFILLS for any discharge medications will be authorized once you are discharged, as it is imperative that you return to your primary care physician (or establish a relationship with a primary care physician if you do not have one) for your aftercare needs so that they can reassess your need for medications and monitor your lab values.    Today   CHIEF COMPLAINT:   Chief Complaint  Patient presents with  . Wrist Injury  . Loss of Consciousness    HISTORY OF PRESENT ILLNESS:  Danielle [name]  is a 61 y.o. female with a known history of anxiety, osteoarthritis, history of breast cancer, COPD, diabetes, hypertension, who presents to the hospital after a syncopal episode and noted to  have a left wrist fracture.Patient says that she was in a usual state of health and was out with some friends yesterday when they helped get her up from a chair and she blacked out and fell forward on the floor. Patient denies any prodromal symptoms  Fall like chest pain, shortness of breath, palpitations, nausea, vomiting, headache or any other associated symptoms. Patient does say that 2-3 weeks ago when she got up in the middle of night to go to the bathroom she had a episode of dizziness but did not fall or lose consciousness. Patient does say that she has a poor appetite and eats only one meal a day. She presented to the hospital was noted to be mildly hyponatremicand hospitalist services were contacted further treatment and evaluation.   VITAL SIGNS:  Blood pressure 108/73, pulse 95, temperature 98 F (36.7 C), temperature source Oral, resp. rate 18, height '5\' 6"'$  (1.676 m), weight 64.4 kg (142 lb), SpO2 93 %.  I/O:   Intake/Output Summary (Last 24 hours) at 02/11/17 1056 Last data filed at 02/11/17 0955  Gross per 24 hour  Intake  1996.67 ml  Output             1500 ml  Net           496.67 ml    PHYSICAL EXAMINATION:   GENERAL:  61 y.o.-year-old patient lying in bed in no acute distress.  EYES: Pupils equal, round, reactive to light and accommodation. No scleral icterus. Extraocular muscles intact.  HEENT: Head atraumatic, normocephalic. Oropharynx and nasopharynx clear.  NECK:  Supple, no jugular venous distention. No thyroid enlargement, no tenderness.  LUNGS: Normal breath sounds bilaterally, no wheezing, rales, rhonchi. No use of accessory muscles of respiration.  CARDIOVASCULAR: S1, S2 normal. No murmurs, rubs, or gallops.  ABDOMEN: Soft, nontender, nondistended. Bowel sounds present. No organomegaly or mass.  EXTREMITIES: No cyanosis, clubbing or edema b/l.   Left upper ext. In cast.   NEUROLOGIC: Cranial nerves II through XII are intact. No focal Motor or sensory  deficits b/l.   PSYCHIATRIC: The patient is alert and oriented x 3.  SKIN: No obvious rash, lesion, or ulcer.   DATA REVIEW:   CBC  Recent Labs Lab 02/10/17 0739  WBC 4.2  HGB 9.1*  HCT 27.4*  PLT 235    Chemistries   Recent Labs Lab 02/10/17 0739  02/11/17 0549  NA 127*  < > 132*  K 4.0  --   --   CL 94*  --   --   CO2 25  --   --   GLUCOSE 108*  --   --   BUN 10  --   --   CREATININE 0.79  --   --   CALCIUM 8.3*  --   --   < > = values in this interval not displayed.  Cardiac Enzymes  Recent Labs Lab 02/09/17 1912  TROPONINI <0.03    Microbiology Results  Results for orders placed or performed during the hospital encounter of 04/11/16  Culture, bal-quantitative     Status: None   Collection Time: 04/11/16  3:15 PM  Result Value Ref Range Status   Specimen Description BRONCHIAL ALVEOLAR LAVAGE  Final   Special Requests Normal  Final   Gram Stain   Final    FEW WBC PRESENT, PREDOMINANTLY MONONUCLEAR NO ORGANISMS SEEN    Culture   Final    NO GROWTH 2 DAYS Performed at [facility]    Report Status 04/14/2016 FINAL  Final    RADIOLOGY:  US Carotid Bilateral  Result Date: 02/09/2017 CLINICAL DATA:  Syncope. EXAM: BILATERAL CAROTID DUPLEX ULTRASOUND TECHNIQUE: Pearline Cables scale imaging, color Doppler and duplex ultrasound were performed of bilateral carotid and vertebral arteries in the neck. COMPARISON:  07/03/2016 M FINDINGS: Criteria: Quantification of carotid stenosis is based on velocity parameters that correlate the residual internal carotid diameter with NASCET-based stenosis levels, using the diameter of the distal internal carotid lumen as the denominator for stenosis measurement. The following velocity measurements were obtained: RIGHT ICA:  120 cm/sec CCA:  409 cm/sec SYSTOLIC ICA/CCA RATIO:  1.2 DIASTOLIC ICA/CCA RATIO:  2.0 ECA:  83 cm/sec LEFT ICA:  88 cm/sec CCA:  85 cm/sec SYSTOLIC ICA/CCA RATIO:  1.0 DIASTOLIC ICA/CCA RATIO:  1.9 ECA:  88  cm/sec RIGHT CAROTID ARTERY: Echogenic plaque at the right carotid bulb. External carotid artery is patent with normal waveform. Normal waveforms and velocities in the internal carotid artery. Visualization of the internal carotid artery is limited on this examination. RIGHT VERTEBRAL ARTERY: Antegrade flow and normal waveform in the right vertebral artery.  LEFT CAROTID ARTERY: Echogenic plaque at the left carotid bulb. External carotid artery is patent with normal waveform. Normal waveforms and velocities in the internal carotid artery. LEFT VERTEBRAL ARTERY: Antegrade flow and normal waveform in the left vertebral artery. IMPRESSION: Atherosclerotic disease at the carotid bulbs bilaterally. Estimated degree of stenosis in the internal carotid arteries is less than 50% bilaterally. Patent vertebral arteries. Electronically Signed   By: Markus Daft M.D.   On: 02/09/2017 14:18    EKG:   Orders placed or performed during the hospital encounter of 02/09/17  . EKG 12-Lead  . EKG 12-Lead  . ED EKG  . ED EKG      Management plans discussed with the patient, family and they are in agreement.  CODE STATUS:     Code Status Orders        Start     Ordered   02/09/17 1126  Full code  Continuous     02/09/17 1125    Code Status History    Date Active Date Inactive Code Status Order ID Comments User Context   This patient has a current code status but no historical code status.      TOTAL TIME TAKING CARE OF THIS PATIENT: 35 minutes.    [name] M.D on 02/11/2017 at 10:56 AM  Between 7am to 6pm - Pager - [phone]  After 6pm go to [email or web address] - password [email or web address] [place] [facility]  Office  [phone]  CC: Primary care physician; [place]   Note: This dictation was prepared with Dragon dictation along with smaller phrase technology. Any transcriptional errors that result from this process are unintentional.

## 2017-02-11 NOTE — Progress Notes (Signed)
Pt refuses bed alarm. Educated on safety.  

## 2017-02-11 NOTE — Progress Notes (Signed)
  Subjective:  Patient reports left wrist pain as mild.    Objective:   VITALS:   Vitals:   02/10/17 2009 02/11/17 0652 02/11/17 0913 02/11/17 1115  BP: 109/67 109/75 108/73   Pulse: 88 75 95   Resp: 18 18    Temp: 98 F (36.7 C) 98 F (36.7 C)    TempSrc: Oral Oral    SpO2: 93% 97% 93% (!) 89%  Weight:      Height:        PHYSICAL EXAM:  Left wrist:  Patient's volar splint in place. Neurovascular intact Sensation intact distally Intact pulses distally Compartment soft  LABS  Results for orders placed or performed during the hospital encounter of 02/09/17 (from the past 24 hour(s))  Sodium, urine, random     Status: None   Collection Time: 02/10/17  5:18 PM  Result Value Ref Range   Sodium, Ur 43 mmol/L  Osmolality, urine     Status: Abnormal   Collection Time: 02/10/17  5:18 PM  Result Value Ref Range   Osmolality, Ur 147 (L) 300 - 900 mOsm/kg  Sodium     Status: Abnormal   Collection Time: 02/11/17  5:49 AM  Result Value Ref Range   Sodium 132 (L) 135 - 145 mmol/L    US Carotid Bilateral  Result Date: 02/09/2017 CLINICAL DATA:  Syncope. EXAM: BILATERAL CAROTID DUPLEX ULTRASOUND TECHNIQUE: Pearline Cables scale imaging, color Doppler and duplex ultrasound were performed of bilateral carotid and vertebral arteries in the neck. COMPARISON:  07/03/2016 M FINDINGS: Criteria: Quantification of carotid stenosis is based on velocity parameters that correlate the residual internal carotid diameter with NASCET-based stenosis levels, using the diameter of the distal internal carotid lumen as the denominator for stenosis measurement. The following velocity measurements were obtained: RIGHT ICA:  120 cm/sec CCA:  233 cm/sec SYSTOLIC ICA/CCA RATIO:  1.2 DIASTOLIC ICA/CCA RATIO:  2.0 ECA:  83 cm/sec LEFT ICA:  88 cm/sec CCA:  85 cm/sec SYSTOLIC ICA/CCA RATIO:  1.0 DIASTOLIC ICA/CCA RATIO:  1.9 ECA:  88 cm/sec RIGHT CAROTID ARTERY: Echogenic plaque at the right carotid bulb. External carotid  artery is patent with normal waveform. Normal waveforms and velocities in the internal carotid artery. Visualization of the internal carotid artery is limited on this examination. RIGHT VERTEBRAL ARTERY: Antegrade flow and normal waveform in the right vertebral artery. LEFT CAROTID ARTERY: Echogenic plaque at the left carotid bulb. External carotid artery is patent with normal waveform. Normal waveforms and velocities in the internal carotid artery. LEFT VERTEBRAL ARTERY: Antegrade flow and normal waveform in the left vertebral artery. IMPRESSION: Atherosclerotic disease at the carotid bulbs bilaterally. Estimated degree of stenosis in the internal carotid arteries is less than 50% bilaterally. Patent vertebral arteries. Electronically Signed   By: Markus Daft M.D.   On: 02/09/2017 14:18    Assessment/Plan:     Active Problems:   Syncope   Hyponatremia   Patient is being discharged home today. She should follow up with me in the office in 1 week for reevaluation and x-ray. Short arm cast reapplied in the office if her fracture remains in its current position. She knows to avoid weightbearing or lifting with the left arm until her follow-up. She should continue to ice and elevate her left upper extremity at home.   Thornton Park , MD 02/11/2017, 1:23 PM

## 2017-02-11 NOTE — Progress Notes (Signed)
Patient given discharge teaching and paperwork regarding medications, diet, follow-up appointments and activity. Patient understanding verbalized. No complaints at this time. IV and telemetry discontinued prior to leaving. Skin assessment as previously charted and vitals are stable; on room air. Patient being discharged to . Prescriptions handed to patient.

## 2017-02-19 ENCOUNTER — Ambulatory Visit: Payer: BLUE CROSS/BLUE SHIELD

## 2017-02-22 ENCOUNTER — Ambulatory Visit: Admission: RE | Admit: 2017-02-22 | Payer: BLUE CROSS/BLUE SHIELD | Source: Ambulatory Visit

## 2017-02-25 ENCOUNTER — Inpatient Hospital Stay: Payer: BLUE CROSS/BLUE SHIELD

## 2017-02-25 ENCOUNTER — Inpatient Hospital Stay: Payer: BLUE CROSS/BLUE SHIELD | Admitting: Internal Medicine

## 2017-02-25 ENCOUNTER — Ambulatory Visit: Payer: BLUE CROSS/BLUE SHIELD | Admitting: Radiation Oncology

## 2017-03-01 ENCOUNTER — Ambulatory Visit
Admission: RE | Admit: 2017-03-01 | Discharge: 2017-03-01 | Disposition: A | Discharge status: Home / Self Care | Payer: BLUE CROSS/BLUE SHIELD | Source: Ambulatory Visit | Attending: Radiation Oncology | Admitting: Radiation Oncology

## 2017-03-01 DIAGNOSIS — I2510 Atherosclerotic heart disease of native coronary artery without angina pectoris: Secondary | ICD-10-CM | POA: Diagnosis not present

## 2017-03-01 DIAGNOSIS — C3411 Malignant neoplasm of upper lobe, right bronchus or lung: Secondary | ICD-10-CM | POA: Insufficient documentation

## 2017-03-01 DIAGNOSIS — I700 Atherosclerosis of aorta: Secondary | ICD-10-CM | POA: Insufficient documentation

## 2017-03-01 DIAGNOSIS — Z9889 Other specified postprocedural states: Secondary | ICD-10-CM | POA: Diagnosis not present

## 2017-03-01 DIAGNOSIS — J439 Emphysema, unspecified: Secondary | ICD-10-CM | POA: Diagnosis not present

## 2017-03-01 HISTORY — DX: Malignant neoplasm of unspecified part of right bronchus or lung: C34.91

## 2017-03-01 MED ORDER — IOPAMIDOL (ISOVUE-300) INJECTION 61%
75.0000 mL | Freq: Once | INTRAVENOUS | Status: AC | PRN
  Administered 2017-03-01: 75 mL via INTRAVENOUS

## 2017-03-06 ENCOUNTER — Inpatient Hospital Stay: Payer: BLUE CROSS/BLUE SHIELD | Attending: Internal Medicine

## 2017-03-06 ENCOUNTER — Ambulatory Visit
Admission: RE | Admit: 2017-03-06 | Discharge: 2017-03-06 | Disposition: A | Discharge status: Home / Self Care | Payer: BLUE CROSS/BLUE SHIELD | Source: Ambulatory Visit | Attending: Radiation Oncology | Admitting: Radiation Oncology

## 2017-03-06 ENCOUNTER — Inpatient Hospital Stay (HOSPITAL_BASED_OUTPATIENT_CLINIC_OR_DEPARTMENT_OTHER): Payer: BLUE CROSS/BLUE SHIELD | Admitting: Internal Medicine

## 2017-03-06 VITALS — BP 122/78 | HR 98 | Temp 97.8°F | Resp 18 | Wt 143.0 lb

## 2017-03-06 DIAGNOSIS — J449 Chronic obstructive pulmonary disease, unspecified: Secondary | ICD-10-CM | POA: Insufficient documentation

## 2017-03-06 DIAGNOSIS — Z9012 Acquired absence of left breast and nipple: Secondary | ICD-10-CM | POA: Insufficient documentation

## 2017-03-06 DIAGNOSIS — Z803 Family history of malignant neoplasm of breast: Secondary | ICD-10-CM

## 2017-03-06 DIAGNOSIS — Z923 Personal history of irradiation: Secondary | ICD-10-CM | POA: Insufficient documentation

## 2017-03-06 DIAGNOSIS — C3411 Malignant neoplasm of upper lobe, right bronchus or lung: Secondary | ICD-10-CM | POA: Diagnosis not present

## 2017-03-06 DIAGNOSIS — C50812 Malignant neoplasm of overlapping sites of left female breast: Secondary | ICD-10-CM

## 2017-03-06 DIAGNOSIS — Z85118 Personal history of other malignant neoplasm of bronchus and lung: Secondary | ICD-10-CM | POA: Diagnosis not present

## 2017-03-06 DIAGNOSIS — R0602 Shortness of breath: Secondary | ICD-10-CM | POA: Diagnosis not present

## 2017-03-06 DIAGNOSIS — Z87891 Personal history of nicotine dependence: Secondary | ICD-10-CM | POA: Insufficient documentation

## 2017-03-06 DIAGNOSIS — F419 Anxiety disorder, unspecified: Secondary | ICD-10-CM

## 2017-03-06 DIAGNOSIS — G629 Polyneuropathy, unspecified: Secondary | ICD-10-CM

## 2017-03-06 DIAGNOSIS — Z853 Personal history of malignant neoplasm of breast: Secondary | ICD-10-CM | POA: Insufficient documentation

## 2017-03-06 DIAGNOSIS — Z79899 Other long term (current) drug therapy: Secondary | ICD-10-CM

## 2017-03-06 DIAGNOSIS — Z170 Estrogen receptor positive status [ER+]: Secondary | ICD-10-CM

## 2017-03-06 DIAGNOSIS — I10 Essential (primary) hypertension: Secondary | ICD-10-CM

## 2017-03-06 DIAGNOSIS — I700 Atherosclerosis of aorta: Secondary | ICD-10-CM | POA: Insufficient documentation

## 2017-03-06 DIAGNOSIS — Z9221 Personal history of antineoplastic chemotherapy: Secondary | ICD-10-CM | POA: Insufficient documentation

## 2017-03-06 DIAGNOSIS — M129 Arthropathy, unspecified: Secondary | ICD-10-CM | POA: Insufficient documentation

## 2017-03-06 LAB — BASIC METABOLIC PANEL
Anion gap: 10 (ref 5–15)
BUN: 12 mg/dL (ref 6–20)
CALCIUM: 9.2 mg/dL (ref 8.9–10.3)
CO2: 24 mmol/L (ref 22–32)
CREATININE: 0.75 mg/dL (ref 0.44–1.00)
Chloride: 97 mmol/L — ABNORMAL LOW (ref 101–111)
GFR calc non Af Amer: >60 mL/min (ref >60)
Glucose, Bld: 88 mg/dL (ref 65–99)
Potassium: 4 mmol/L (ref 3.5–5.1)
SODIUM: 131 mmol/L — AB (ref 135–145)

## 2017-03-06 LAB — CBC WITH DIFFERENTIAL/PLATELET
BASOS PCT: 3 %
Basophils Absolute: 0.1 10*3/uL (ref 0–0.1)
EOS ABS: 0.2 10*3/uL (ref 0–0.7)
Eosinophils Relative: 4 %
HCT: 27.7 % — ABNORMAL LOW (ref 35.0–47.0)
HEMOGLOBIN: 9.5 g/dL — AB (ref 12.0–16.0)
Lymphocytes Relative: 29 %
Lymphs Abs: 1.3 10*3/uL (ref 1.0–3.6)
MCH: 32.6 pg (ref 26.0–34.0)
MCHC: 34.4 g/dL (ref 32.0–36.0)
MCV: 94.8 fL (ref 80.0–100.0)
MONOS PCT: 13 %
Monocytes Absolute: 0.6 10*3/uL (ref 0.2–0.9)
NEUTROS PCT: 51 %
Neutro Abs: 2.3 10*3/uL (ref 1.4–6.5)
PLATELETS: 304 10*3/uL (ref 150–440)
RBC: 2.92 MIL/uL — AB (ref 3.80–5.20)
RDW: 14.3 % (ref 11.5–14.5)
WBC: 4.4 10*3/uL (ref 3.6–11.0)

## 2017-03-06 NOTE — Progress Notes (Signed)
Patient is here for follow up, no major complaints

## 2017-03-06 NOTE — Progress Notes (Signed)
[facility] OFFICE PROGRESS NOTE  Patient Care Team: Leanna Sato, MD as PCP - General (Family Medicine)  Cancer Staging Breast cancer in female [facility]) Staging form: Breast, AJCC 7th Edition - Clinical: Stage IIIA (T3, N1, M0) - Signed by Loann Quill, NP on 02/21/2016    Oncology History   # 2016- JAN RUL 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 A. LUNG MASS, RIGHT; CT-GUIDED BIOPSY: - ADENOCARCINOMA, ACINAR AND PAPILLARY MORPHOLOGIES. S/p RT [finished Dec 2017] s/p RT [facility] 2017]  # 2000-  Left breast cancer [T3N1- stage III] mastecomy s/p RT; NO chemo; Tamoxifen  # NOV 111 2017- Mol testing [RML- adeno ca]         Breast cancer in female (HCC)   07/24/1999 Initial Diagnosis    Breast cancer in female (HCC) T3 N1 M0 tumor ER/PR positive      08/24/1999 -  Anti-estrogen oral therapy    Started tamoxifen      08/24/1999 Surgery    Status post modified radical mastectomy of left breast       Chemotherapy          Radiation Therapy         11/24/2003 - 02/20/2009 Anti-estrogen oral therapy    Started Aromasin       Carcinoma of overlapping sites of left breast in female, estrogen receptor positive (HCC)   06/22/2016 Initial Diagnosis    Cancer of overlapping sites of left female breast [facility])      Primary cancer of right upper lobe of lung (HCC)     INTERVAL HISTORY:  VERBENA [name] 61 y.o.  female pleasant patient above history ofBreast cancer; Stage I squamous cell lung cancer; and also history of right middle lobe lung Adenocarcinoma [biopsy September 2017] status post radiation finished December 2017 is here for follow-up/ To review the results of the CT scan.  Patient continues to have chronic shortness of breath not any worse. No hemoptysis. No chest pain. No headaches. Chronic mild tingling and numbness in  the extremity. Not any worse.  REVIEW OF SYSTEMS:  A complete 10 point review of system is done which is negative except mentioned above/history of present illness.   PAST MEDICAL HISTORY :  Past Medical History:  Diagnosis Date  . Anxiety   . Arthritis   . Cancer (HCC) left    breast cancer 2000  . Cancer of right lung (HCC) 02/21/2016   rad tx's.   [name] COPD (chronic obstructive pulmonary disease) (HCC)   . Heart murmur   . Hypertension   . Lung nodule   . Shortness of breath dyspnea    with exertion  . Status post chemotherapy 2001   left breast cancer  . Status post radiation therapy 2001   left breast cancer    PAST SURGICAL HISTORY :   Past Surgical History:  Procedure Laterality Date  . Breast Biospy Left    [facility]  . BREAST SURGERY    . DILATION AND CURETTAGE OF UTERUS    . ELECTROMAGNETIC NAVIGATION BROCHOSCOPY Right 04/11/2016   Procedure: ELECTROMAGNETIC NAVIGATION BRONCHOSCOPY;  Surgeon: Stephanie Acre, MD;  Location: [facility] ORS;  Service: Cardiopulmonary;  Laterality: Right;  . LUNG BIOPSY    . MASTECTOMY Left    2000, [facility]  . ROTATOR CUFF REPAIR Right    [facility]  FAMILY HISTORY :   Family History  Problem Relation Age of Onset  . Breast cancer Mother 38  . Cancer Mother        Breast   . Cirrhosis Father   . Breast cancer Paternal Aunt 48  . Cancer Maternal Aunt        Breast     SOCIAL HISTORY:   Social History  Substance Use Topics  . Smoking status: Former Smoker    Packs/day: 0.50    Years: 20.00    Types: Cigarettes    Quit date: 07/02/2012  . Smokeless tobacco: Never Used     Comment: quit 2014  . Alcohol use Yes     Comment: Occasionally    ALLERGIES:  has No Known Allergies.  MEDICATIONS:  Current Outpatient Prescriptions  Medication Sig Dispense Refill  . albuterol (PROAIR HFA) 108 (90 BASE) MCG/ACT inhaler Inhale 1-2 puffs into the lungs every 6 (six) hours as needed for wheezing or shortness of breath.     [name] amLODipine (NORVASC) 10  MG tablet Take 10 mg by mouth daily. for blood pressure  3  . budesonide-formoterol (SYMBICORT) 160-4.5 MCG/ACT inhaler Inhale 2 puffs into the lungs 2 (two) times daily.    . celecoxib (CELEBREX) 200 MG capsule Take 1 capsule (200 mg total) by mouth daily. 30 capsule 0  . citalopram (CELEXA) 20 MG tablet Take 20 mg by mouth daily.     [name] HYDROcodone-acetaminophen (NORCO/VICODIN) 5-325 MG tablet Take 1 tablet by mouth every 4 (four) hours as needed for moderate pain. 30 tablet 0  . losartan (COZAAR) 100 MG tablet Take 1 tablet (100 mg total) by mouth daily. 30 tablet 0  . meloxicam (MOBIC) 7.5 MG tablet Take 7.5 mg by mouth daily.     . potassium chloride SA (K-DUR,KLOR-CON) 20 MEQ tablet Take 1 tablet (20 mEq total) by mouth daily. 30 tablet 6  . tiotropium (SPIRIVA) 18 MCG inhalation capsule Place into inhaler and inhale.     No current facility-administered medications for this visit.     PHYSICAL EXAMINATION: ECOG PERFORMANCE STATUS: 0 - Asymptomatic  BP 122/78 (BP Location: Right Arm, Patient Position: Sitting)   Pulse 98   Temp 97.8 F (36.6 C) (Tympanic)   Resp 18   Wt 143 lb (64.9 kg)   SpO2 95%   BMI 23.08 kg/m   Filed Weights   03/06/17 0909  Weight: 143 lb (64.9 kg)    GENERAL: Well-nourished well-developed; Alert, no distress and comfortable.   Alone.  EYES: no pallor or icterus OROPHARYNX: no thrush or ulceration; good dentition  NECK: supple, no masses felt LYMPH:  no palpable lymphadenopathy in the cervical, axillary or inguinal regions LUNGS: clear to auscultation and  No wheeze or crackles HEART/CVS: regular rate & rhythm and no murmurs; No lower extremity edema ABDOMEN:abdomen soft, non-tender and normal bowel sounds Musculoskeletal:no cyanosis of digits and no clubbing  PSYCH: alert & oriented x 3 with fluent speech NEURO: no focal motor/sensory deficits SKIN:  no rashes or significant lesions  LABORATORY DATA:  I have reviewed the data as listed     Component Value Date/Time   NA 131 (L) 03/06/2017 0845   NA 132 (L) 02/09/2015 1100   K 4.0 03/06/2017 0845   K 3.8 02/09/2015 1100   CL 97 (L) 03/06/2017 0845   CL 95 (L) 02/09/2015 1100   CO2 24 03/06/2017 0845   CO2 29 02/09/2015 1100   GLUCOSE 88 03/06/2017 0845   GLUCOSE  105 (H) 02/09/2015 1100   BUN 12 03/06/2017 0845   BUN 16 02/09/2015 1100   CREATININE 0.75 03/06/2017 0845   CREATININE 0.81 02/09/2015 1100   CALCIUM 9.2 03/06/2017 0845   CALCIUM 9.1 02/09/2015 1100   PROT 7.8 06/22/2016 1421   PROT 7.7 02/09/2015 1100   ALBUMIN 4.3 06/22/2016 1421   ALBUMIN 4.3 02/09/2015 1100   AST 35 06/22/2016 1421   AST 29 02/09/2015 1100   ALT 33 06/22/2016 1421   ALT 20 02/09/2015 1100   ALKPHOS 84 06/22/2016 1421   ALKPHOS 69 02/09/2015 1100   BILITOT 0.5 06/22/2016 1421   BILITOT 0.9 02/09/2015 1100   GFRNONAA >60 03/06/2017 0845   GFRNONAA >60 02/09/2015 1100   GFRAA >60 03/06/2017 0845   GFRAA >60 02/09/2015 1100    No results found for: SPEP, UPEP  Lab Results  Component Value Date   WBC 4.4 03/06/2017   NEUTROABS 2.3 03/06/2017   HGB 9.5 (L) 03/06/2017   HCT 27.7 (L) 03/06/2017   MCV 94.8 03/06/2017   PLT 304 03/06/2017      Chemistry      Component Value Date/Time   NA 131 (L) 03/06/2017 0845   NA 132 (L) 02/09/2015 1100   K 4.0 03/06/2017 0845   K 3.8 02/09/2015 1100   CL 97 (L) 03/06/2017 0845   CL 95 (L) 02/09/2015 1100   CO2 24 03/06/2017 0845   CO2 29 02/09/2015 1100   BUN 12 03/06/2017 0845   BUN 16 02/09/2015 1100   CREATININE 0.75 03/06/2017 0845   CREATININE 0.81 02/09/2015 1100      Component Value Date/Time   CALCIUM 9.2 03/06/2017 0845   CALCIUM 9.1 02/09/2015 1100   ALKPHOS 84 06/22/2016 1421   ALKPHOS 69 02/09/2015 1100   AST 35 06/22/2016 1421   AST 29 02/09/2015 1100   ALT 33 06/22/2016 1421   ALT 20 02/09/2015 1100   BILITOT 0.5 06/22/2016 1421   BILITOT 0.9 02/09/2015 1100    IMPRESSION:  IMPRESSION: 1. Today's  study demonstrates a positive response to therapy. All of the previously noted lung lesions appear smaller, including the somewhat ill-defined right upper lobe sub solid mass, as detailed above. 2. Diffuse bronchial wall thickening with moderate centrilobular and paraseptal emphysema; imaging findings suggestive of underlying COPD. 3. Aortic atherosclerosis, in addition to left main and 3 vessel coronary artery disease. Please note that although the presence of coronary artery calcium documents the presence of coronary artery disease, the severity of this disease and any potential stenosis cannot be assessed on this non-gated CT examination. Assessment for potential risk factor modification, dietary therapy or pharmacologic therapy may be warranted, if clinically indicated. 4. There are calcifications of the aortic valve and mitral annulus. Echocardiographic correlation for evaluation of potential valvular dysfunction may be warranted if clinically indicated.   Electronically Signed   By: Vinnie Langton M.D.   On: 03/01/2017 15:42   RADIOGRAPHIC STUDIES: I have personally reviewed the radiological images as listed and agreed with the findings in the report. No results found.   ASSESSMENT & PLAN:  Primary cancer of right upper lobe of lung ([place]) # Right lung Lung nodules ~ 2 cm in size; s/p Bx- proven adenocarcinoma. S/p RT dec 2017. No evidence of any clinical progression. CT scan 03/01/2017- shows improvement of the right upper lobe lung nodule; chronic scarring/COPD changes.   # History of ER/PR positive HER-2/neu negative breast cancer stage III [2000]- clinically no evidence of recurrence.  #  Anemia- hb 9.5; NOT iron deficient; monitor for now.  # Follow up with a M.D.- few days prior CT scan; in  6 months with labs  # I reviewed the blood work- with the patient in detail; also reviewed the imaging independently [as summarized above]; and with the patient in detail.    Orders Placed This Encounter  Procedures  . CT CHEST W CONTRAST    Standing Status:   Future    Standing Expiration Date:   05/06/2018    Order Specific Question:   Reason for Exam (SYMPTOM  OR DIAGNOSIS REQUIRED)    Answer:   lung cancer    Order Specific Question:   Preferred imaging location?    Answer:   [facility]  . MM SCREENING BREAST TOMO UNI R    Standing Status:   Future    Standing Expiration Date:   05/06/2018    Order Specific Question:   Reason for Exam (SYMPTOM  OR DIAGNOSIS REQUIRED)    Answer:   History of breast cancer    Order Specific Question:   Preferred imaging location?    Answer:   [facility]   All questions were answered. The patient knows to call the clinic with any problems, questions or concerns.      [name], MD 03/06/2017 8:32 PM

## 2017-03-06 NOTE — Assessment & Plan Note (Addendum)
#  Right lung Lung nodules ~ 2 cm in size; s/p Bx- proven adenocarcinoma. S/p RT dec 2017. No evidence of any clinical progression. CT scan 03/01/2017- shows improvement of the right upper lobe lung nodule; chronic scarring/COPD changes.   # History of ER/PR positive HER-2/neu negative breast cancer stage III [2000]- clinically no evidence of recurrence.  # Anemia- hb 9.5; NOT iron deficient; monitor for now.  # Follow up with a M.D.- few days prior CT scan; in  6 months with labs  # I reviewed the blood work- with the patient in detail; also reviewed the imaging independently [as summarized above]; and with the patient in detail.

## 2017-03-06 NOTE — Progress Notes (Signed)
Radiation Oncology Follow up Note  Name: Danielle [name]   Date:   03/06/2017 MRN:  [id number] DOB: [birth year]    This 61 y.o. female presents to the clinic today for 5 month follow-up status post SB RT for the second time to a right upper lobe non-small cell lung cancer.  REFERRING PROVIDER: [facility]  HPI: Patient is a 61 year old female now out 5 months having completed SB RT for non-small cell lung cancer of her right upper lobe. She was treated back in 2003 free T3 N1 ER here positive invasive mammary carcinoma left breast status post radical mastectomy and adjuvant chemotherapy followed by chest wall peripheral lymphatic radiation. She also had SB RT to her right upper lobe prior to that. She is seen today in routine follow-up and is doing well. She specifically denies cough hemoptysis or chest tightness. She's had no change in her pulmonary status. Recently had a CT scan of her chest. Showing positive response to therapy all previously noted lung lesions appear smaller including the somewhat ill-defined right upper lobe sub-solid mass.  COMPLICATIONS OF TREATMENT: none  FOLLOW UP COMPLIANCE: keeps appointments   PHYSICAL EXAM:  There were no vitals taken for this visit. Well-developed well-nourished patient in NAD. HEENT reveals PERLA, EOMI, discs not visualized.  Oral cavity is clear. No oral mucosal lesions are identified. Neck is clear without evidence of cervical or supraclavicular adenopathy. Lungs are clear to A&P. Cardiac examination is essentially unremarkable with regular rate and rhythm without murmur rub or thrill. Abdomen is benign with no organomegaly or masses noted. Motor sensory and DTR levels are equal and symmetric in the upper and lower extremities. Cranial nerves II through XII are grossly intact. Proprioception is intact. No peripheral adenopathy or edema is identified. No motor or sensory levels are noted. Crude visual fields are within normal  range.  RADIOLOGY RESULTS: CT scan reviewed and compatible above-stated findings  PLAN: Present time she is doing well with excellent results by CT criteria. I'm please were overall progress. I've asked to see her back in 6 months for follow-up and will review her CT scan at that time. She's already scheduled for a CT scan in November 2018. Patient knows to call sooner with any concerns.  I would like to take this opportunity to thank you for allowing me to participate in the care of your patient.Armstead Peaks., MD

## 2017-03-12 ENCOUNTER — Encounter: Payer: Self-pay | Admitting: *Deleted

## 2017-03-13 ENCOUNTER — Ambulatory Visit
Admission: RE | Admit: 2017-03-13 | Payer: BLUE CROSS/BLUE SHIELD | Source: Ambulatory Visit | Admitting: Unknown Physician Specialty

## 2017-03-13 ENCOUNTER — Encounter: Admission: RE | Payer: Self-pay | Source: Ambulatory Visit

## 2017-03-13 HISTORY — DX: Lymphedema, not elsewhere classified: I89.0

## 2017-03-13 HISTORY — DX: Dependence on supplemental oxygen: Z99.81

## 2017-03-13 HISTORY — DX: Depression, unspecified: F32.A

## 2017-03-13 HISTORY — DX: Major depressive disorder, single episode, unspecified: F32.9

## 2017-03-13 SURGERY — COLONOSCOPY WITH PROPOFOL
Anesthesia: General

## 2017-03-25 IMAGING — CR DG CHEST 2V
1 series · 2 of 2 positions shown · non-contrast
Comparison: Chest radiograph October 21, 2014; PET-CT August 15, 2015

CLINICAL DATA: Shortness of breath for 2 weeks. Prior lung and
breast carcinoma

EXAM:
CHEST  2 VIEW

[Series 1: dg chest 2 view · 0.14mm/px · 2 of 2 slices shown]
[im 1/2]
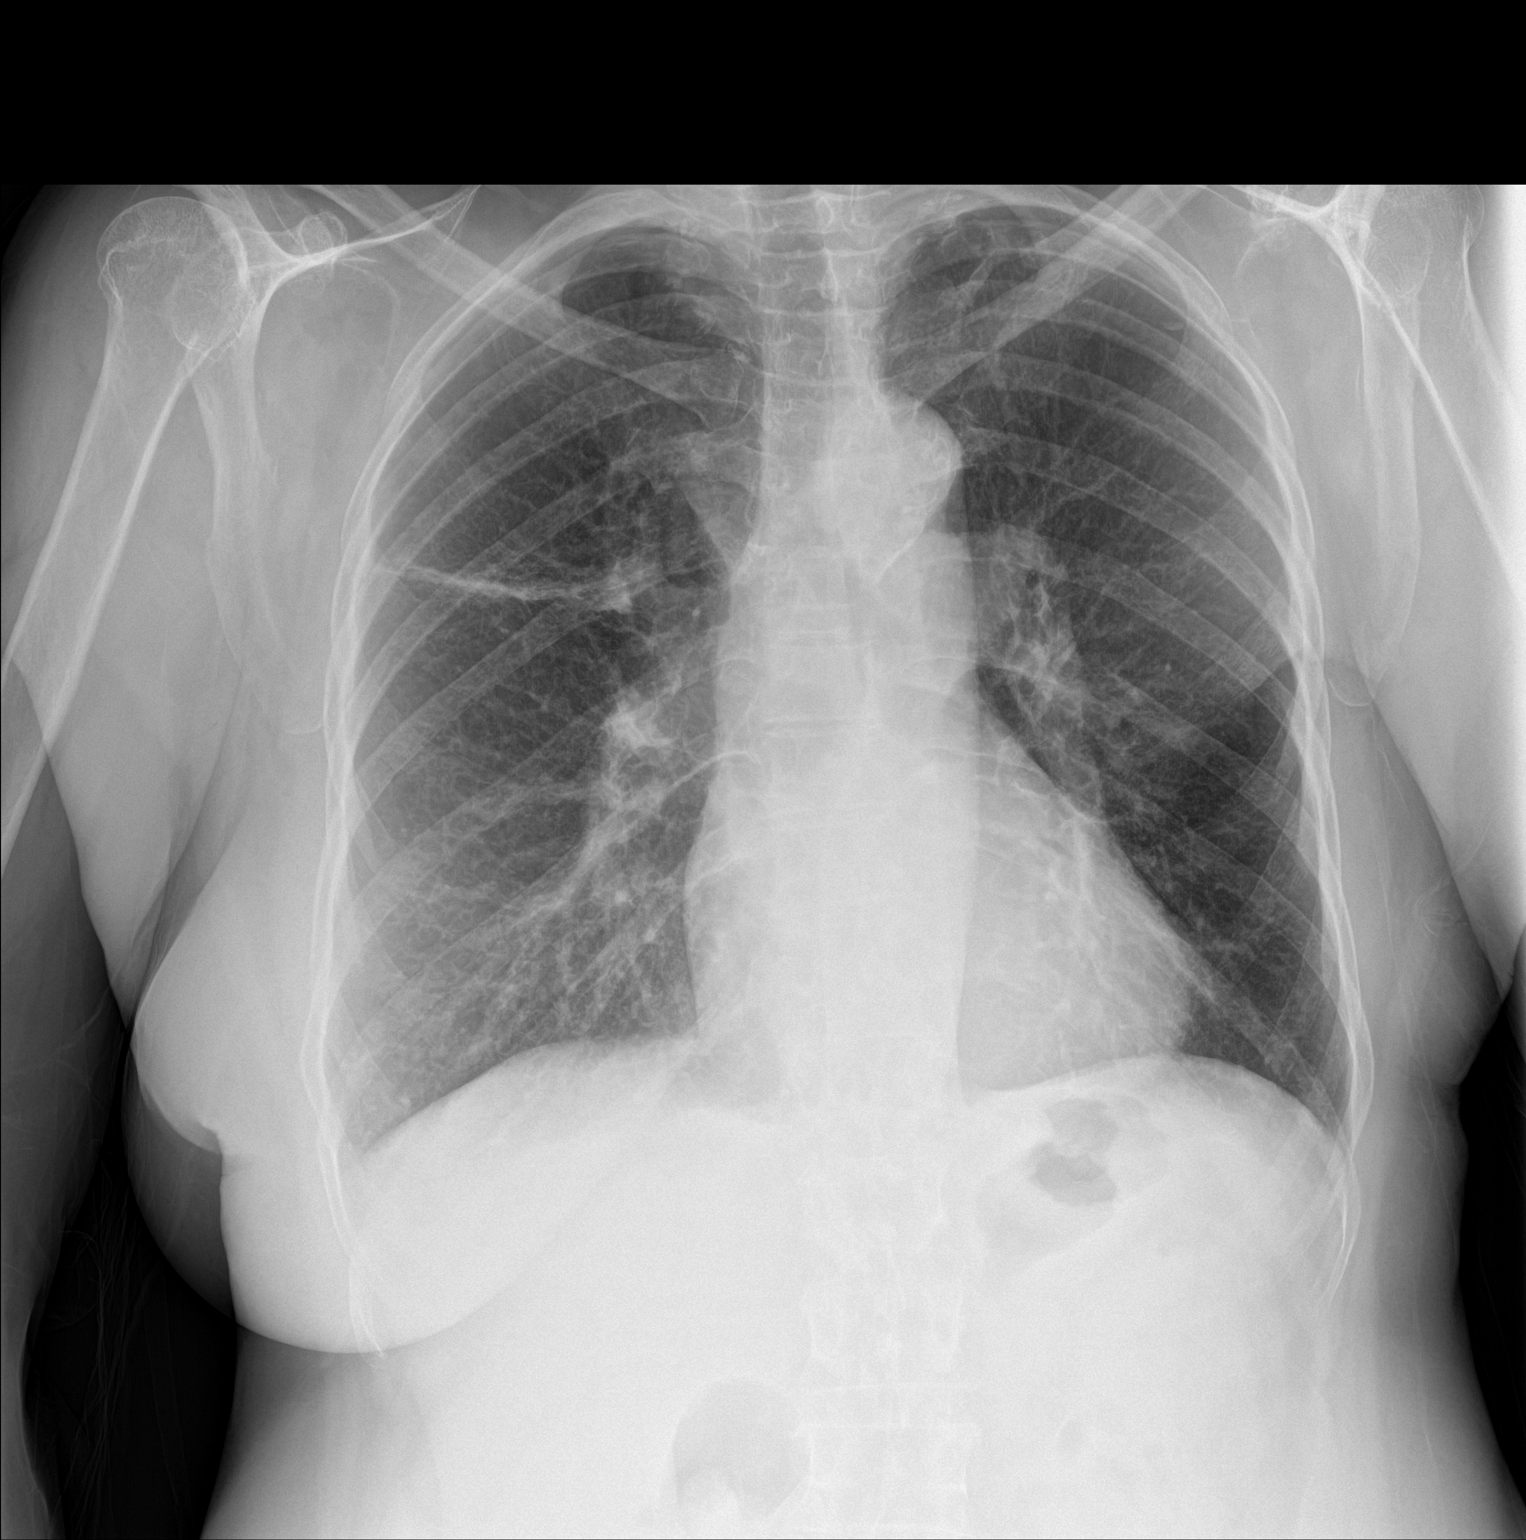
[im 2/2]
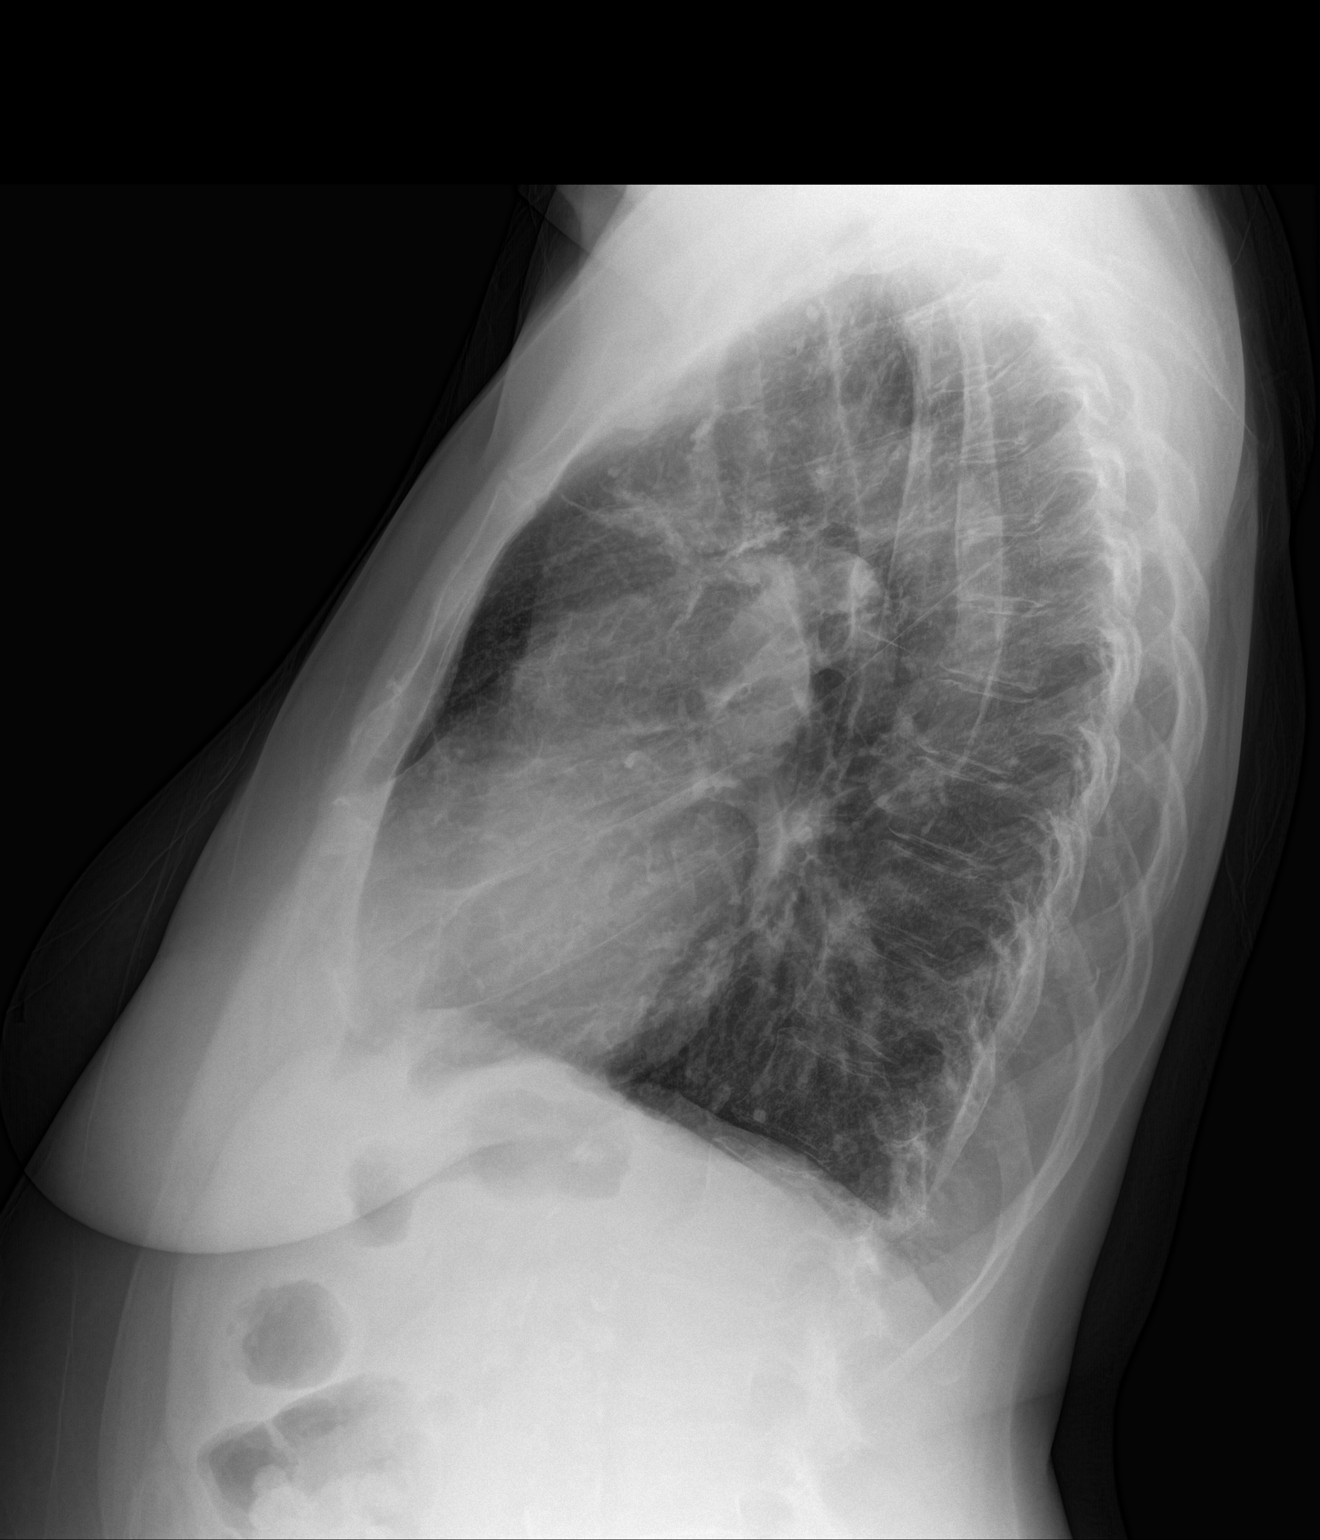

[2 of 2 positions shown; findings below may reference images not displayed]

FINDINGS: There is atelectatic change in the anterior segment of the right
upper lobe. There is scarring in the left apex. There is no frank
edema or consolidation. The heart size and pulmonary vascularity are
normal. No adenopathy. No bone lesions. There is atherosclerotic
calcification in ascending aorta and aortic arch regions. Patient is
status post left mastectomy.
IMPRESSION: Atelectasis anterior segment right upper lobe. Scarring in the left
apex. No frank edema or consolidation. No adenopathy apparent by
radiography. Status post left mastectomy.

## 2017-03-27 IMAGING — CT CT ANGIO CHEST
1 of 2 series · 18 of 30 positions shown · IV contrast (omnipaque)
Comparison: 08/09/2015

CLINICAL DATA: shortness of breath, hx lung cancer Patient states
she has been having SOB, for about 2 months and seems to be getting
worse over the last 2 weeks. Patient states she feels like their is
mucus in her chest, but is unable to get it up. HX: Breast CA -[id number]

EXAM:
CT ANGIOGRAPHY CHEST WITH CONTRAST
TECHNIQUE: Multidetector CT imaging of the chest was performed using the
standard protocol during bolus administration of intravenous
contrast. Multiplanar CT image reconstructions and MIPs were
obtained to evaluate the vascular anatomy.
CONTRAST:  75mL OMNIPAQUE IOHEXOL 350 MG/ML SOLN

[Series 6: pe 1.0 thins · axial · 0.72mm/px · z∈[-256,+3]mm · 18 of 293 slices shown]
[im 17/293  lung]
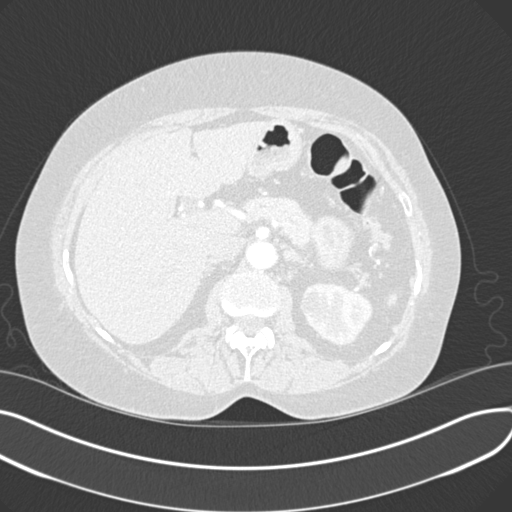
[im 33/293  mediastinal]
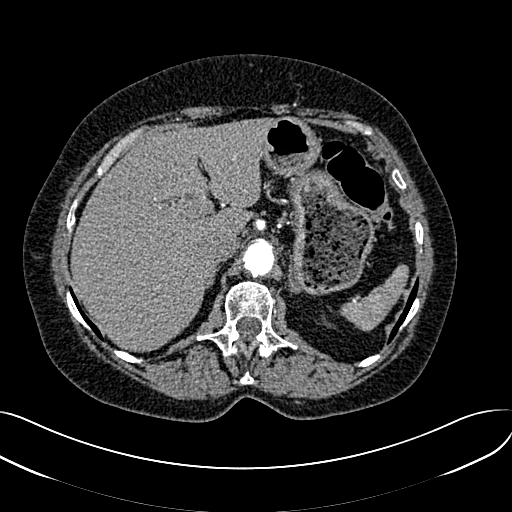
[im 49/293  lung]
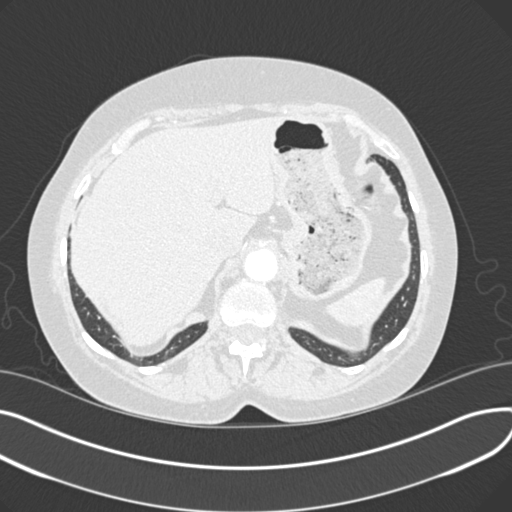
[im 65/293  mediastinal]
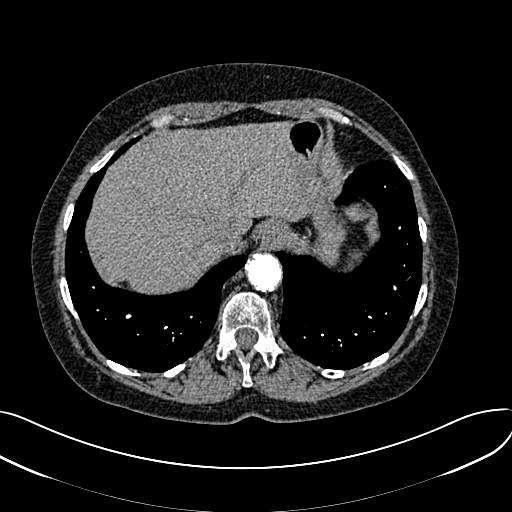
[im 82/293  lung]
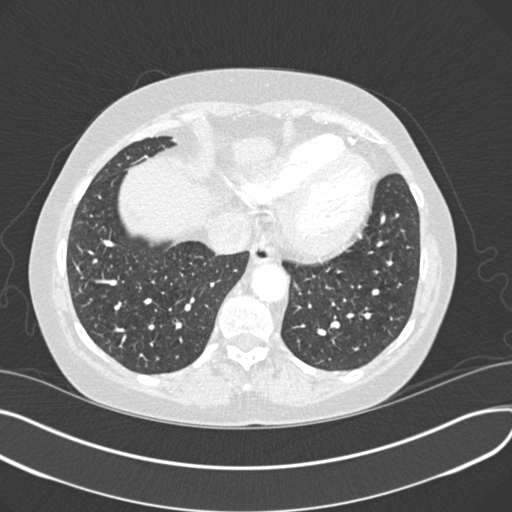
[im 98/293  mediastinal]
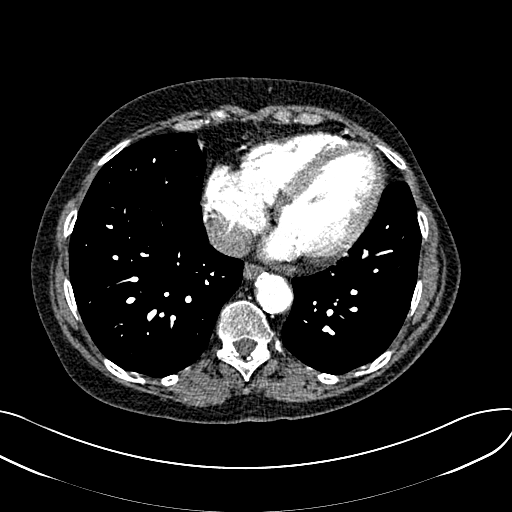
[im 114/293  lung]
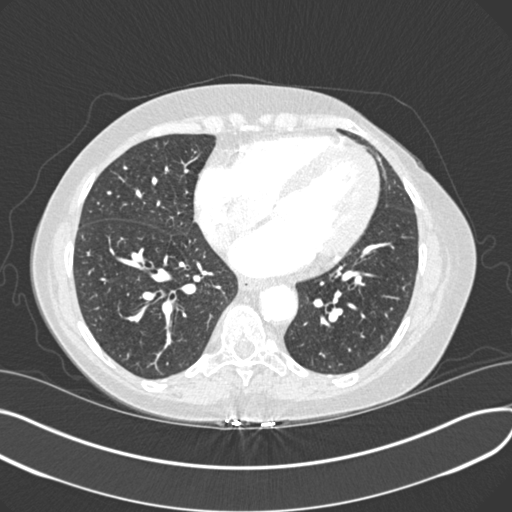
[im 130/293  mediastinal]
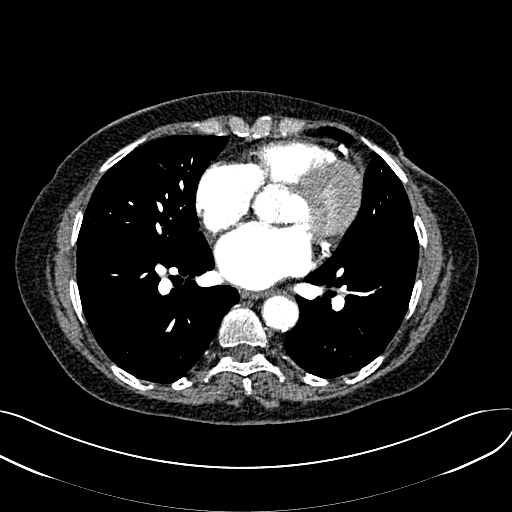
[im 137/293  lung]
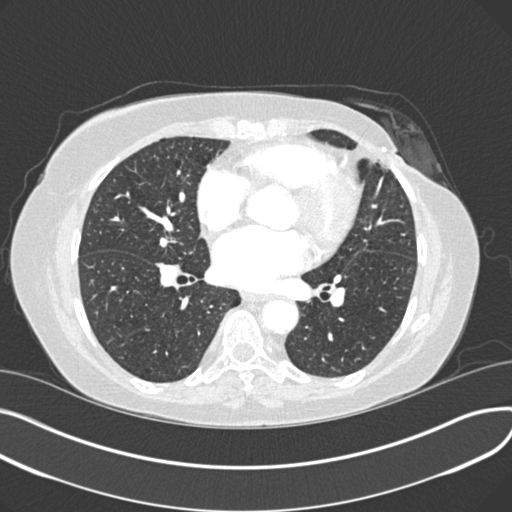
[im 147/293  mediastinal]
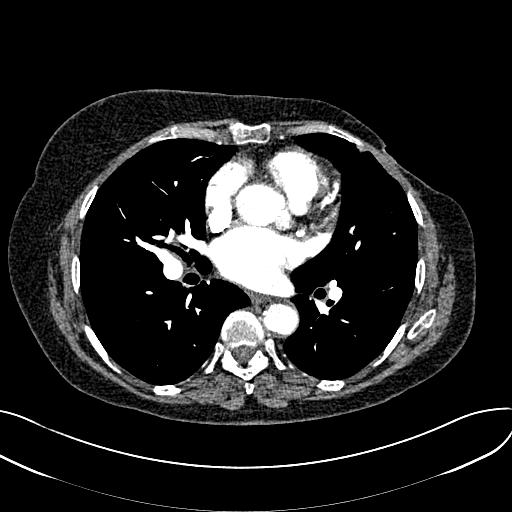
[im 163/293  lung]
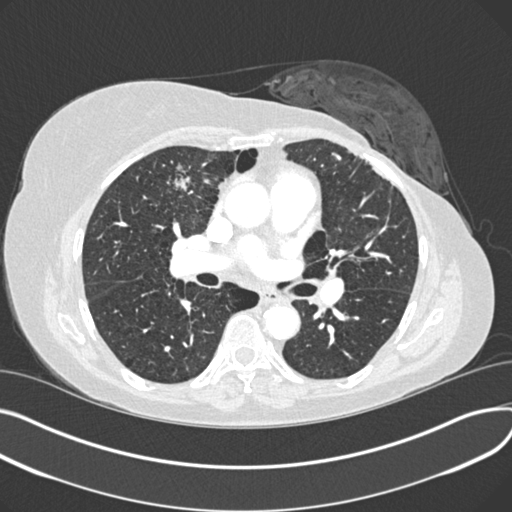
[im 179/293  mediastinal]
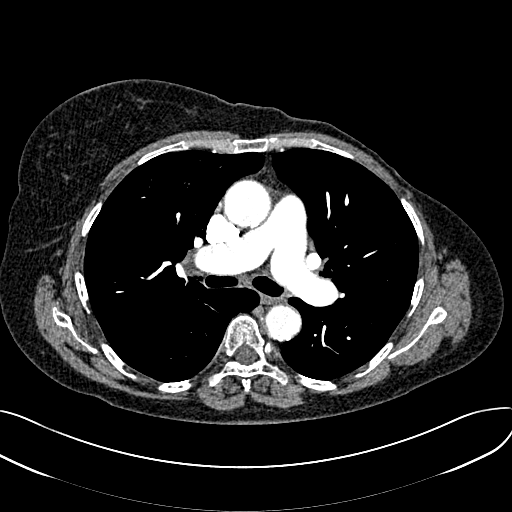
[im 195/293  lung]
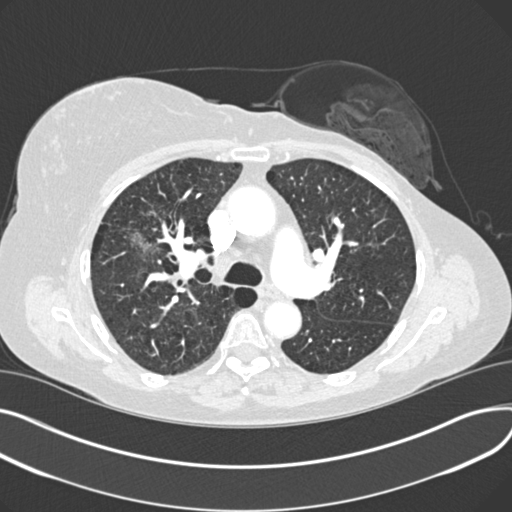
[im 211/293  mediastinal]
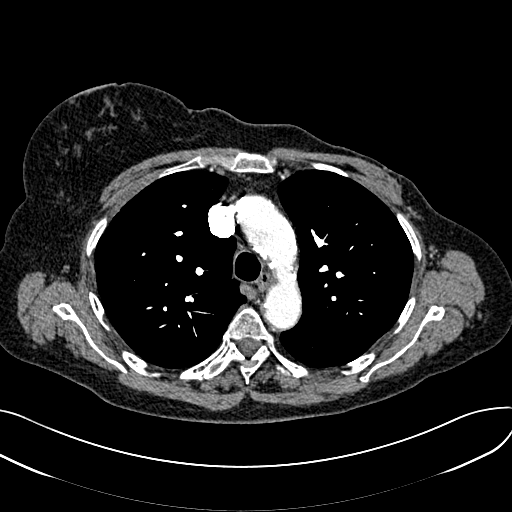
[im 228/293  lung]
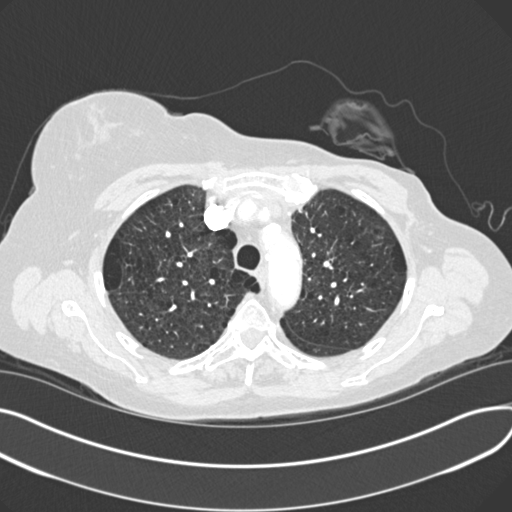
[im 244/293  mediastinal]
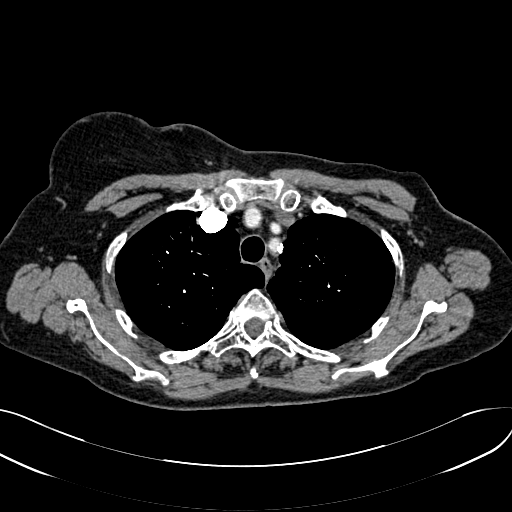
[im 260/293  lung]
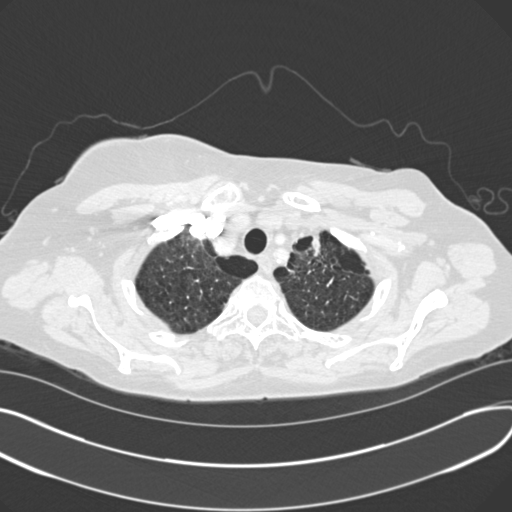
[im 276/293  mediastinal]
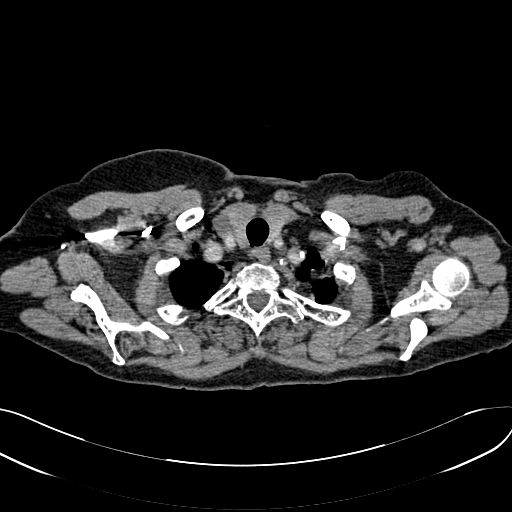

[18 of 30 positions shown; findings below may reference images not displayed]

FINDINGS: Right arm IV contrast injection. The SVC is patent. Right atrium and
right ventricle are nondilated. Enlarged central pulmonary arteries
suggesting pulmonary hypertension. Satisfactory opacification of
pulmonary arteries noted, and there is no evidence of pulmonary
emboli. Patent bilateral pulmonary veins. Mild left atrial
enlargement. Moderate coronary calcifications. Adequate contrast
opacification of the thoracic aorta with no evidence of dissection,
aneurysm, or stenosis. There is bovine variant brachiocephalic arch
anatomy without proximal stenosis. Coarse atheromatous
calcifications in the aortic arch, descending and proximal abdominal
segments.

No pleural or pericardial effusion. Subcentimeter prevascular and
precarinal lymph nodes. No hilar adenopathy. emphysematous changes
most marked in the upper lobes. Asymmetric left apical nodular
opacity. Scarring or atelectasis in the lateral right middle lobe
and cluster of nodular opacities in the anterior right middle lobe,
not significantly progressed since 08/09/2015. No new nodule or
airspace disease.

Thoracic spine and sternum intact. Changes of previous left
mastectomy.

Stable probable cyst in hepatic segment 4A. Remainder visualized
upper abdomen unremarkable.

Review of the MIP images confirms the above findings.
IMPRESSION: 1. Negative for acute PE or thoracic aortic dissection.
2. Atherosclerosis, including aortic and coronary artery disease.
Please note that although the presence of coronary artery calcium
documents the presence of coronary artery disease, the severity of
this disease and any potential stenosis cannot be assessed on this
non-gated CT examination. Assessment for potential risk factor
modification, dietary therapy or pharmacologic therapy may be
warranted, if clinically indicated.
3. Emphysema with stable right upper lobe and left apical opacities
as before.

## 2017-04-02 ENCOUNTER — Ambulatory Visit: Payer: BLUE CROSS/BLUE SHIELD

## 2017-04-19 ENCOUNTER — Emergency Department: Payer: BLUE CROSS/BLUE SHIELD

## 2017-04-19 ENCOUNTER — Emergency Department
Admission: EM | Admit: 2017-04-19 | Discharge: 2017-04-19 | Disposition: A | Discharge status: Home / Self Care | Payer: BLUE CROSS/BLUE SHIELD | Attending: Emergency Medicine | Admitting: Emergency Medicine

## 2017-04-19 DIAGNOSIS — Z85118 Personal history of other malignant neoplasm of bronchus and lung: Secondary | ICD-10-CM | POA: Insufficient documentation

## 2017-04-19 DIAGNOSIS — F10929 Alcohol use, unspecified with intoxication, unspecified: Secondary | ICD-10-CM | POA: Diagnosis not present

## 2017-04-19 DIAGNOSIS — Z87891 Personal history of nicotine dependence: Secondary | ICD-10-CM | POA: Insufficient documentation

## 2017-04-19 DIAGNOSIS — R55 Syncope and collapse: Secondary | ICD-10-CM | POA: Diagnosis not present

## 2017-04-19 DIAGNOSIS — I10 Essential (primary) hypertension: Secondary | ICD-10-CM | POA: Diagnosis not present

## 2017-04-19 DIAGNOSIS — J449 Chronic obstructive pulmonary disease, unspecified: Secondary | ICD-10-CM | POA: Insufficient documentation

## 2017-04-19 DIAGNOSIS — F10920 Alcohol use, unspecified with intoxication, uncomplicated: Secondary | ICD-10-CM

## 2017-04-19 DIAGNOSIS — Z853 Personal history of malignant neoplasm of breast: Secondary | ICD-10-CM | POA: Diagnosis not present

## 2017-04-19 LAB — COMPREHENSIVE METABOLIC PANEL
ALK PHOS: 75 U/L (ref 38–126)
ALT: 35 U/L (ref 14–54)
AST: 50 U/L — ABNORMAL HIGH (ref 15–41)
Albumin: 3.1 g/dL — ABNORMAL LOW (ref 3.5–5.0)
Anion gap: 15 (ref 5–15)
BUN: 6 mg/dL (ref 6–20)
CALCIUM: 7.4 mg/dL — AB (ref 8.9–10.3)
CO2: 19 mmol/L — AB (ref 22–32)
Chloride: 99 mmol/L — ABNORMAL LOW (ref 101–111)
Creatinine, Ser: 0.71 mg/dL (ref 0.44–1.00)
GFR calc non Af Amer: >60 mL/min (ref >60)
Glucose, Bld: 90 mg/dL (ref 65–99)
POTASSIUM: 4.2 mmol/L (ref 3.5–5.1)
SODIUM: 133 mmol/L — AB (ref 135–145)
Total Bilirubin: 1 mg/dL (ref 0.3–1.2)
Total Protein: 6.9 g/dL (ref 6.5–8.1)

## 2017-04-19 LAB — CBC WITH DIFFERENTIAL/PLATELET
BASOS PCT: 2 %
Basophils Absolute: 0.1 10*3/uL (ref 0–0.1)
EOS ABS: 0 10*3/uL (ref 0–0.7)
EOS PCT: 1 %
HCT: 30.6 % — ABNORMAL LOW (ref 35.0–47.0)
HEMOGLOBIN: 10.3 g/dL — AB (ref 12.0–16.0)
LYMPHS ABS: 1.3 10*3/uL (ref 1.0–3.6)
Lymphocytes Relative: 24 %
MCH: 31.3 pg (ref 26.0–34.0)
MCHC: 33.6 g/dL (ref 32.0–36.0)
MCV: 93.1 fL (ref 80.0–100.0)
MONOS PCT: 10 %
Monocytes Absolute: 0.5 10*3/uL (ref 0.2–0.9)
Neutro Abs: 3.4 10*3/uL (ref 1.4–6.5)
Neutrophils Relative %: 63 %
PLATELETS: 234 10*3/uL (ref 150–440)
RBC: 3.29 MIL/uL — ABNORMAL LOW (ref 3.80–5.20)
RDW: 15.8 % — AB (ref 11.5–14.5)
WBC: 5.4 10*3/uL (ref 3.6–11.0)

## 2017-04-19 LAB — ETHANOL: ALCOHOL ETHYL (B): 151 mg/dL — AB (ref <5)

## 2017-04-19 LAB — TROPONIN I: Troponin I: <0.03 ng/mL (ref <0.03)

## 2017-04-19 MED ORDER — ONDANSETRON HCL 4 MG/2ML IJ SOLN
4.0000 mg | Freq: Once | INTRAMUSCULAR | Status: AC
  Administered 2017-04-19: 4 mg via INTRAVENOUS
  Filled 2017-04-19: qty 2

## 2017-04-19 MED ORDER — SODIUM CHLORIDE 0.9 % IV SOLN
Freq: Once | INTRAVENOUS | Status: AC
  Administered 2017-04-19: 22:00:00 via INTRAVENOUS

## 2017-04-19 NOTE — ED Notes (Signed)
Pt given crackers and peanut butter. MD aware

## 2017-04-19 NOTE — ED Provider Notes (Addendum)
[facility] Emergency Department Provider Note       Time seen: ----------------------------------------- 9:22 PM on 04/19/2017 -----------------------------------------     I have reviewed the triage vital signs and the nursing notes.   HISTORY   Chief Complaint Loss of Consciousness    HPI Danielle [name] is a 61 y.o. female who presents to the ED for multiple syncopal episodes. Patient comes via [facility] for passing out 3 times. Patient states she had 312 beers today. She denies any abdominal pain or any other pain for that matter but has had some nausea and vomiting once EMS arrived on the scene. First Episode revealed a blood pressure 75/50. EMS gave 500 cc of fluids and currently her blood pressure is better. She denies fevers, chills or other complaints. Patient is currently requesting something to eat.   Past Medical History:  Diagnosis Date  . Anxiety   . Arthritis   . Cancer ([place]) left    breast cancer 2000  . Cancer of right lung ([place]) 02/21/2016   rad tx's.   [name] COPD (chronic obstructive pulmonary disease) ([place])   . Dependence on supplemental oxygen   . Depression   . Heart murmur   . Hypertension   . Lung nodule   . Lymphedema   . Shortness of breath dyspnea    with exertion  . Status post chemotherapy 2001   left breast cancer  . Status post radiation therapy 2001   left breast cancer    Patient Active Problem List   Diagnosis Date Noted  . Hyponatremia 02/10/2017  . Syncope 02/09/2017  . Lung nodule, solitary 07/25/2016  . Primary cancer of right upper lobe of lung ([place]) 07/25/2016  . Carcinoma of overlapping sites of left breast in female, estrogen receptor positive ([place]) 06/22/2016  . Multiple lung nodules 06/22/2016  . Cough   . Lesion of right lung   . Breast cancer in female [facility]) 02/21/2016  . DOE (dyspnea on exertion) 02/14/2016  . Moderate COPD (chronic obstructive pulmonary disease) ([place]) 08/24/2014    Past  Surgical History:  Procedure Laterality Date  . Breast Biospy Left    [facility]  . BREAST SURGERY    . DILATION AND CURETTAGE OF UTERUS    . ELECTROMAGNETIC NAVIGATION BROCHOSCOPY Right 04/11/2016   Procedure: ELECTROMAGNETIC NAVIGATION BRONCHOSCOPY;  Surgeon: Vilinda Boehringer, MD;  Location: [facility] ORS;  Service: Cardiopulmonary;  Laterality: Right;  . history of colonoscopy]    . LUNG BIOPSY    . MASTECTOMY Left    2000, [facility]  . ROTATOR CUFF REPAIR Right    [facility]    Allergies Patient has no known allergies.  Social History Social History  Substance Use Topics  . Smoking status: Former Smoker    Packs/day: 0.50    Years: 20.00    Types: Cigarettes    Quit date: 07/02/2012  . Smokeless tobacco: Never Used     Comment: quit 2014  . Alcohol use Yes     Comment: Occasionally    Review of Systems Constitutional: Negative for fever. Cardiovascular: Negative for chest pain. Respiratory: Negative for shortness of breath. Gastrointestinal: Negative for abdominal pain, Positive for recent vomiting Genitourinary: Negative for dysuria. Musculoskeletal: Negative for back pain. Skin: Negative for rash. Neurological: Negative for headaches, focal weakness or numbness.  All systems negative/normal/unremarkable except as stated in the HPI  ____________________________________________   PHYSICAL EXAM:  VITAL SIGNS: ED Triage Vitals  Enc Vitals Group     BP 04/19/17 2111  132/82     Pulse Rate 04/19/17 2111 76     Resp 04/19/17 2111 19     Temp 04/19/17 2111 98.3 F (36.8 C)     Temp Source 04/19/17 2111 Oral     SpO2 04/19/17 2111 91 %     Weight 04/19/17 2110 138 lb (62.6 kg)     Height 04/19/17 2110 5\' 6"  (1.676 m)     Head Circumference --      Peak Flow --      Pain Score --      Pain Loc --      Pain Edu? --      Excl. in [place]? --     Constitutional: Alert and oriented. Well appearing and in no distress. Eyes: Conjunctivae are normal. Normal extraocular movements. ENT    Head: Normocephalic and atraumatic.   Nose: No congestion/rhinnorhea.   Mouth/Throat: Mucous membranes are moist.   Neck: No stridor. Cardiovascular: Normal rate, regular rhythm. No murmurs, rubs, or gallops. Respiratory: Normal respiratory effort without tachypnea nor retractions. Breath sounds are clear and equal bilaterally. No wheezes/rales/rhonchi. Gastrointestinal: Soft and nontender. Normal bowel sounds Musculoskeletal: Nontender with normal range of motion in extremities. No lower extremity tenderness nor edema. Neurologic:  Normal speech and language. No gross focal neurologic deficits are appreciated.  Skin:  Skin is warm, dry and intact. No rash noted. Psychiatric: Mood and affect are normal. Speech and behavior are normal.  ____________________________________________  EKG: Interpreted by me. Sinus rhythm rate 83 bpm, normal PR interval, normal QRS, normal QT. No specific ST elevation is noted  ____________________________________________  ED COURSE:  Pertinent labs & imaging results that were available during my care of the patient were reviewed by me and considered in my medical decision making (see chart for details). Patient presents for syncope with alcohol intoxication, we will assess with labs and imaging as indicated.   Procedures ____________________________________________   LABS (pertinent positives/negatives)  Labs Reviewed  CBC WITH DIFFERENTIAL/PLATELET - Abnormal; Notable for the following:       Result Value   RBC 3.29 (*)    Hemoglobin 10.3 (*)    HCT 30.6 (*)    RDW 15.8 (*)    All other components within normal limits  ETHANOL - Abnormal; Notable for the following:    Alcohol, Ethyl (B) 151 (*)    All other components within normal limits  COMPREHENSIVE METABOLIC PANEL  TROPONIN I  URINALYSIS, COMPLETE (UACMP) WITH MICROSCOPIC  URINE DRUG SCREEN, QUALITATIVE ([facility] ONLY)    RADIOLOGY Images were viewed by me  Chest  x-ray  IMPRESSION: No evidence of acute cardiopulmonary disease.  Unchanged right upper lung opacities. ____________________________________________  FINAL ASSESSMENT AND PLAN  Syncope, Alcohol intoxication  Plan: Patient's labs and imaging were dictated above. Patient had presented for syncopal event that occurred at home today. Currently she is asymptomatic. Patient is in no acute distress and feeling better after a liter of fluid. This certainly could be affected by alcohol. We got her up and ambulated her without any complication. She is stable for outpatient follow-up.   Earleen Newport, MD   Note: This note was generated in part or whole with voice recognition software. Voice recognition is usually quite accurate but there are transcription errors that can and very often do occur. I apologize for any typographical errors that were not detected and corrected.     Earleen Newport, MD 04/19/17 8937    Earleen Newport, MD 04/19/17 2252  Earleen Newport, MD 04/19/17 2252

## 2017-04-19 NOTE — ED Notes (Signed)
Lab called and spoke with Shirlean Mylar to get status on Chemistry lab results. Per Robin specimen still on machine and will call back shortly if unable to run due to possible hemolysis.

## 2017-04-19 NOTE — ED Notes (Signed)
Pt O2 sats dropping some bt/w 85/86. Pt states she wears O2 at home sometimes. Pt placed on 2L O2 with O2 now 100%.

## 2017-04-19 NOTE — ED Notes (Signed)
Pt ambulated with no difficulty. Pt states she feels good.

## 2017-04-19 NOTE — ED Triage Notes (Signed)
Pt comes via [facility] from home after 3 syncopal episodes. Pt states she has had 3 12oz beers today. Pt denies any pain, but has had some N/V once EMS arrived on scene. EKG Sinus w/ PVCs. BP after 1st syncopal episode was 75/50. Per EMS pt given 500cc fluids, BP-136/82. Pt A&OX4. Pt appears in NAD. Respirations even and unlabored.

## 2017-06-12 ENCOUNTER — Ambulatory Visit: Admit: 2017-06-12 | Payer: BLUE CROSS/BLUE SHIELD | Admitting: Unknown Physician Specialty

## 2017-06-12 ENCOUNTER — Ambulatory Visit
Admission: RE | Admit: 2017-06-12 | Discharge: 2017-06-12 | Disposition: A | Discharge status: Home / Self Care | Payer: BLUE CROSS/BLUE SHIELD | Source: Ambulatory Visit | Attending: Internal Medicine | Admitting: Internal Medicine

## 2017-06-12 DIAGNOSIS — Z1231 Encounter for screening mammogram for malignant neoplasm of breast: Secondary | ICD-10-CM | POA: Insufficient documentation

## 2017-06-12 DIAGNOSIS — Z170 Estrogen receptor positive status [ER+]: Secondary | ICD-10-CM

## 2017-06-12 DIAGNOSIS — C50812 Malignant neoplasm of overlapping sites of left female breast: Secondary | ICD-10-CM

## 2017-06-12 SURGERY — COLONOSCOPY WITH PROPOFOL
Anesthesia: General

## 2017-07-15 IMAGING — CT CT CHEST W/O CM
1 series · 14 of 34 positions shown, 18 images · non-contrast
Comparison: Chest CT 11/10/2015.  PET-CT 08/15/2015.

CLINICAL DATA: Restaging right lung squamous cell carcinoma
diagnosed in 4564. History of left breast cancer.

EXAM:
CT CHEST WITHOUT CONTRAST
TECHNIQUE: Multidetector CT imaging of the chest was performed following the
standard protocol without IV contrast.

[Series 2: thorax · axial · 0.65mm/px · z∈[-610,-352]mm · 14 of 153 slices shown, 18 images]
[im 12/153  mediastinal]
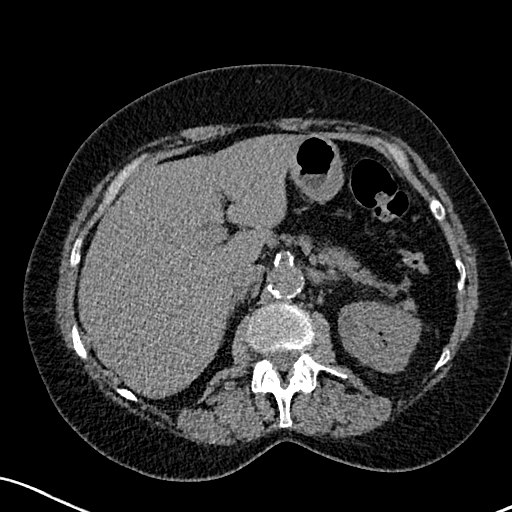
[im 12/153  lung]
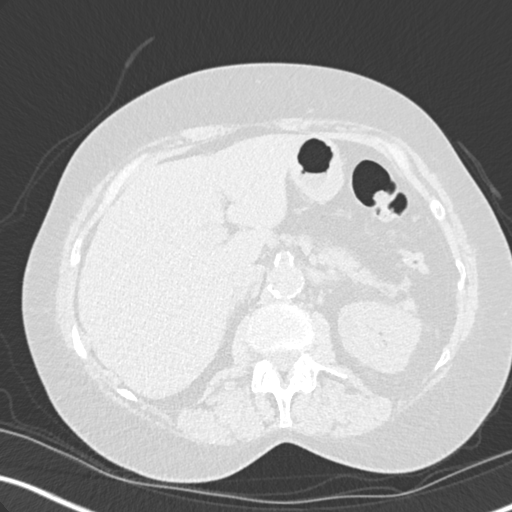
[im 23/153  lung]
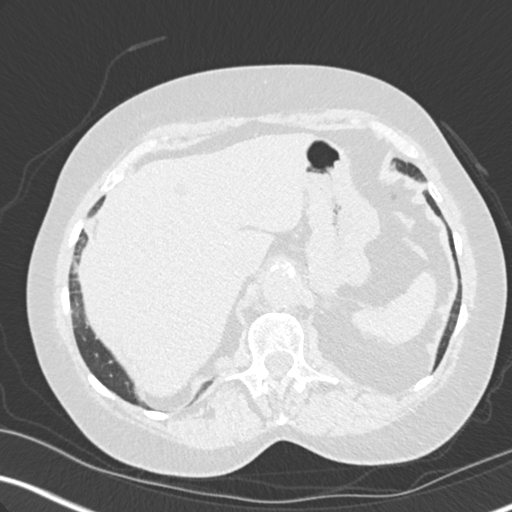
[im 31/153  lung]
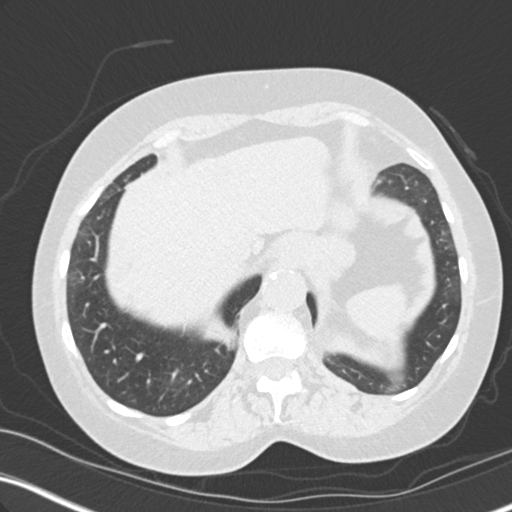
[im 46/153  lung]
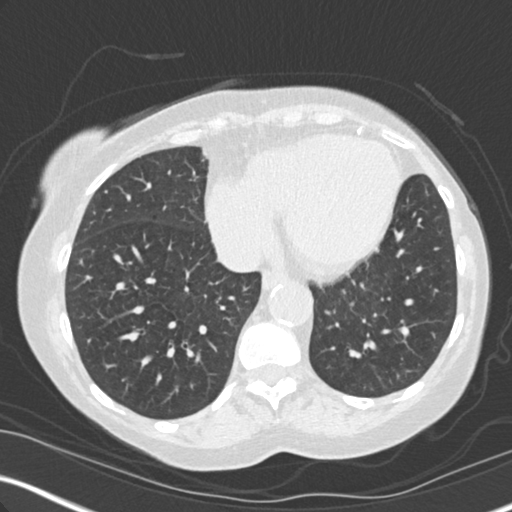
[im 57/153  mediastinal]
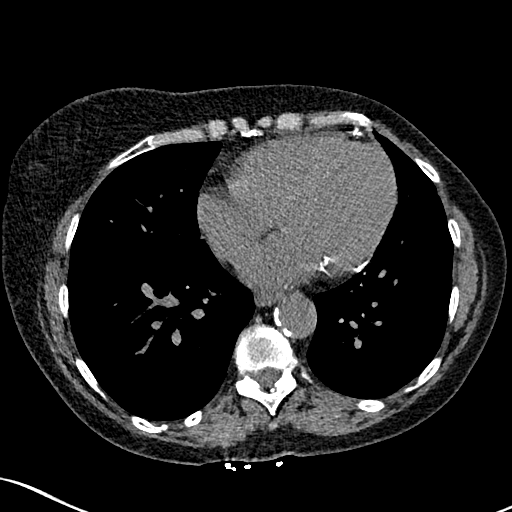
[im 57/153  lung]
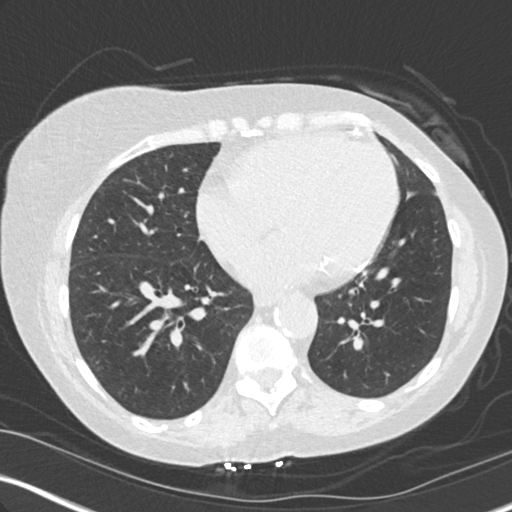
[im 62/153  lung]
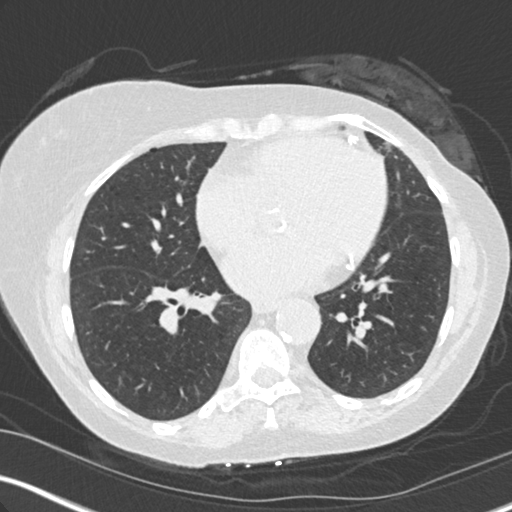
[im 72/153  lung]
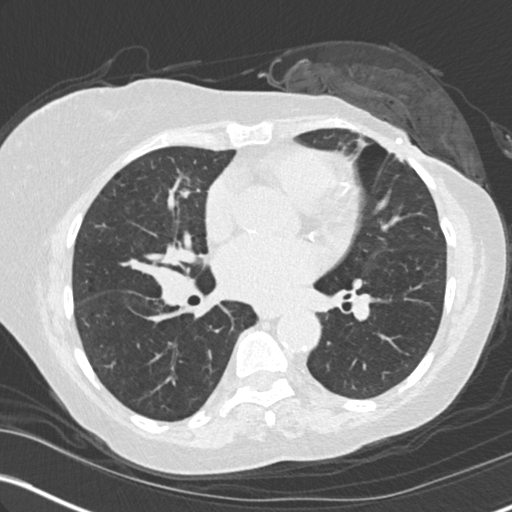
[im 82/153  lung]
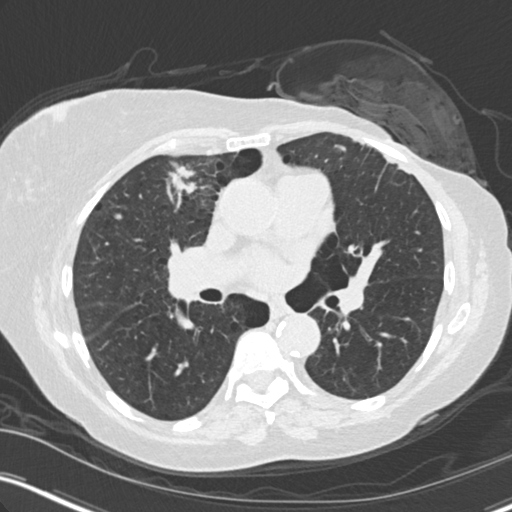
[im 91/153  mediastinal]
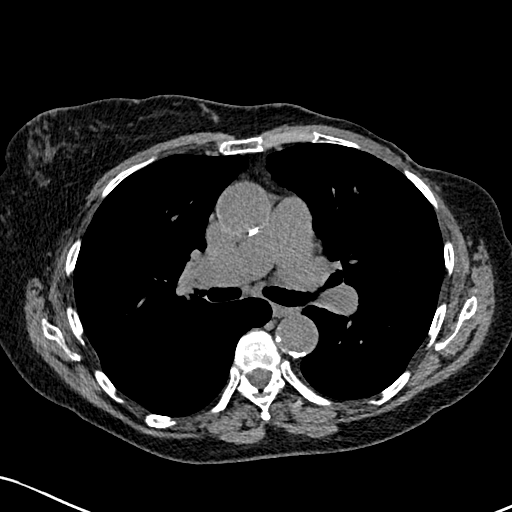
[im 91/153  lung]
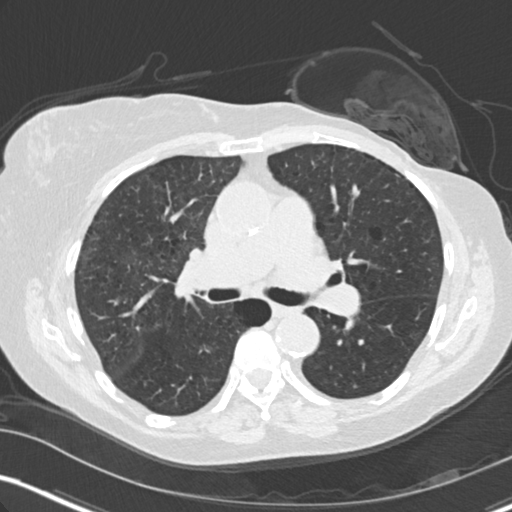
[im 96/153  lung]
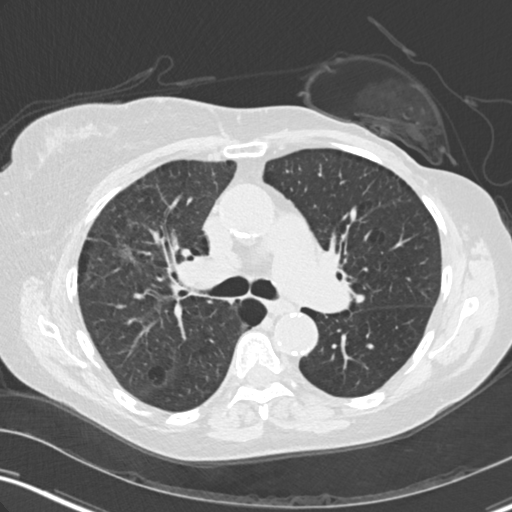
[im 113/153  lung]
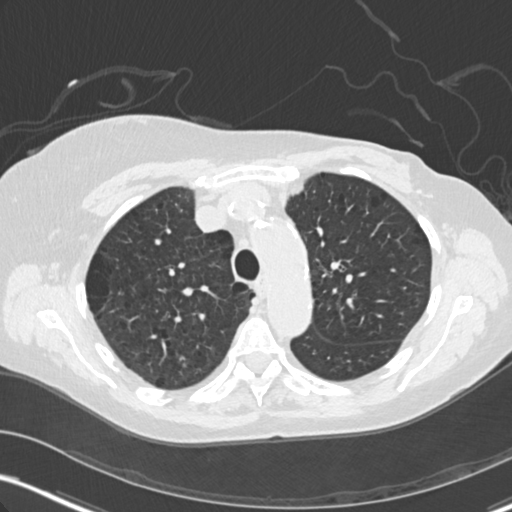
[im 122/153  lung]
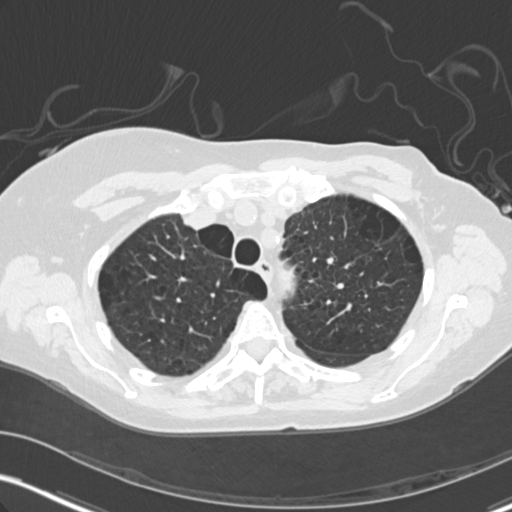
[im 130/153  mediastinal]
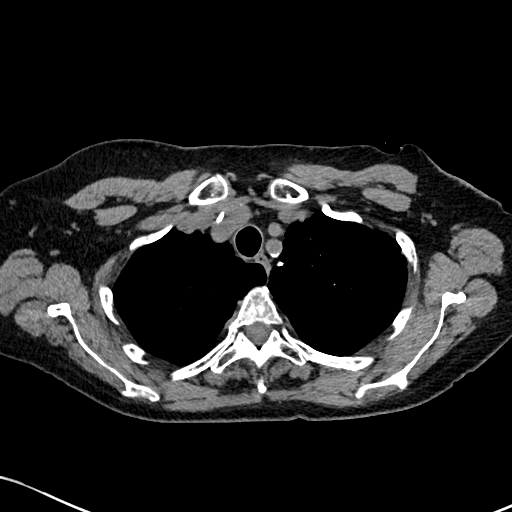
[im 130/153  lung]
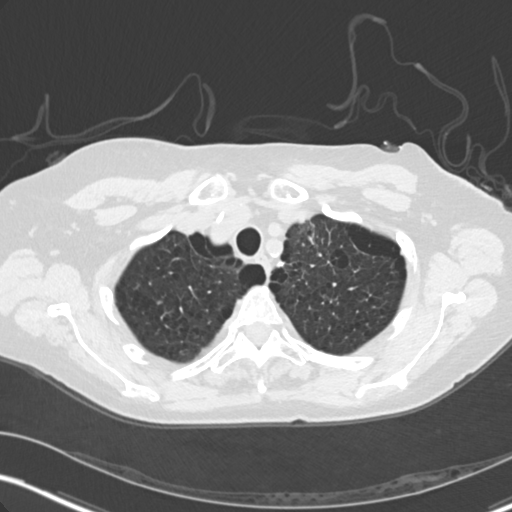
[im 141/153  lung]
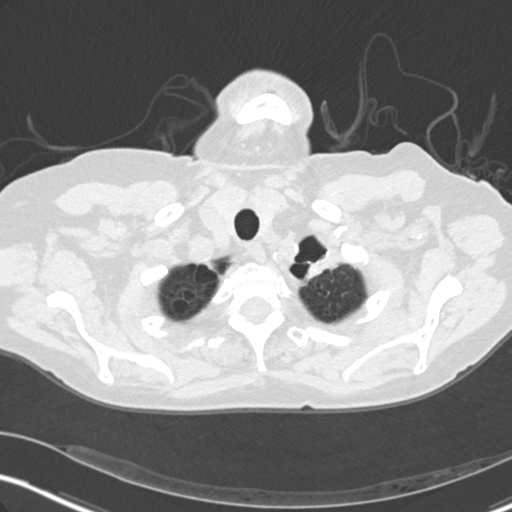

[14 of 34 positions shown; findings below may reference images not displayed]

FINDINGS: Mediastinum/Nodes: There are no enlarged mediastinal, hilar or
axillary lymph nodes.Hilar assessment is limited by the lack of
intravenous contrast, although the hilar contours appear unchanged.
The thyroid gland, trachea and esophagus demonstrate no significant
findings. The heart size is normal. There is no pericardial
effusion. There is diffuse atherosclerosis of the aorta, great
vessels and coronary arteries.

Lungs/Pleura: There is no pleural effusion. Diffuse emphysema again
noted with paraseptal components in both upper lobes. The partially
calcified scarring at the left apex and a subpleural scarring
anteriorly in the left upper lobe attributed to prior radiation
therapy are unchanged. The treated right upper lobe lesion appears
unchanged. However, the irregular right middle lobe lesion has
enlarged and developed increasing solid components. This lesion
remains difficult to measure given its irregular shape, but measures
up to 1.8 x 1.7 cm on axial image 72. This lesion crosses the minor
fissure, extending into the inferior aspect of the upper lobe, best
seen on the coronal images. There is an enlarging nodular component
inferiorly measuring 8 mm on image 81. There is a separate right
middle lobe nodule more laterally measuring 7 x 4 mm on image 71
which also appears slightly larger.

Upper abdomen: The visualized upper abdomen appears stable without
suspicious findings. There is a stable low-density hepatic lesion
and mild biliary dilatation.

Musculoskeletal/Chest wall: Left mastectomy changes are stable. No
evidence of chest wall mass or suspicious osseous finding.
IMPRESSION: 1. Enlarging irregular sub solid right lung lesion involving the
middle and upper lobes with increasing solid components worrisome
for atypical malignancy (morphologically suspicious for primary lung
adenocarcinoma). Tissue sampling recommended. Follow up PET/CT
should be considered for staging purposes. These recommendations are
taken from: Recommendations for the Management of Subsolid Pulmonary
Nodules Detected at CT: A Statement from the [HOSPITAL]
2. Stable treated right upper lobe lesion.
3. No suspicious left lung findings.
4. No evidence of metastatic breast cancer.
5. Diffuse emphysema, scattered pulmonary scarring and diffuse
atherosclerosis again noted.

## 2017-08-14 IMAGING — MG MM DIGITAL SCREENING UNILAT*R* W/ TOMO W/ CAD
6 series · 6 of 14 positions shown · non-contrast
Comparison: Previous exam(s).

CLINICAL DATA: Screening.

EXAM:
2D DIGITAL SCREENING UNILATERAL RIGHT MAMMOGRAM WITH CAD AND ADJUNCT
TOMO

[R CC synth-2D]
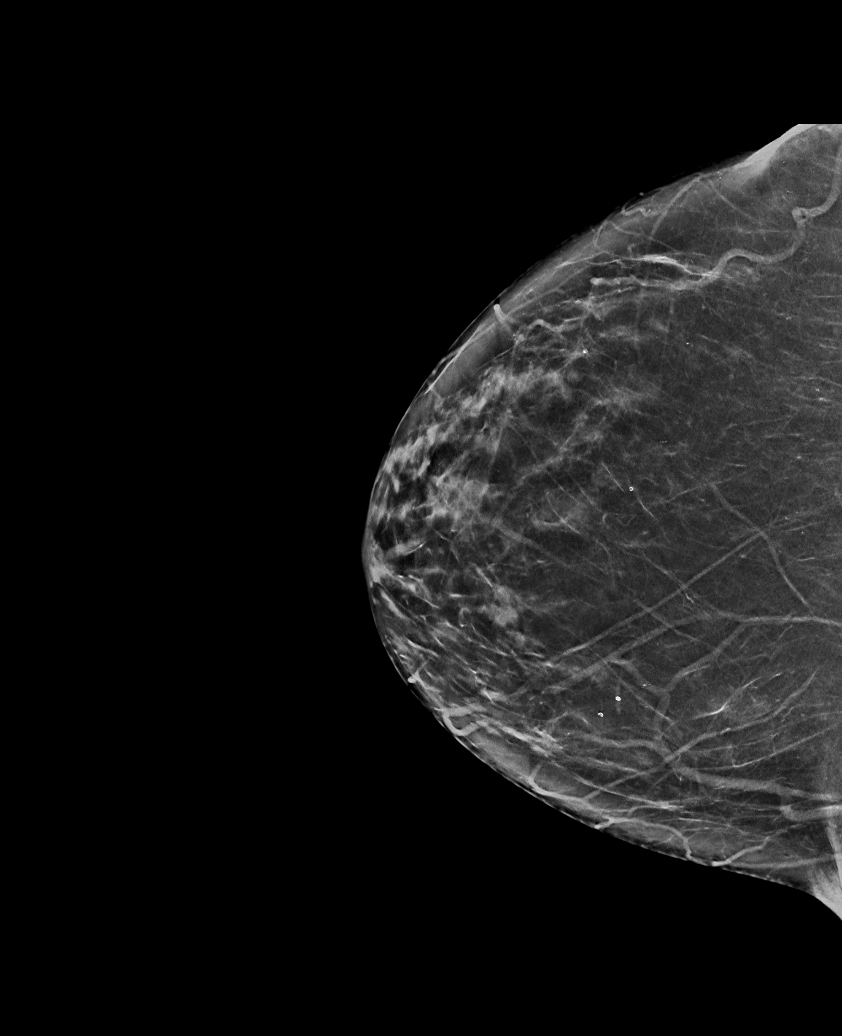

[R MLO synth-2D]
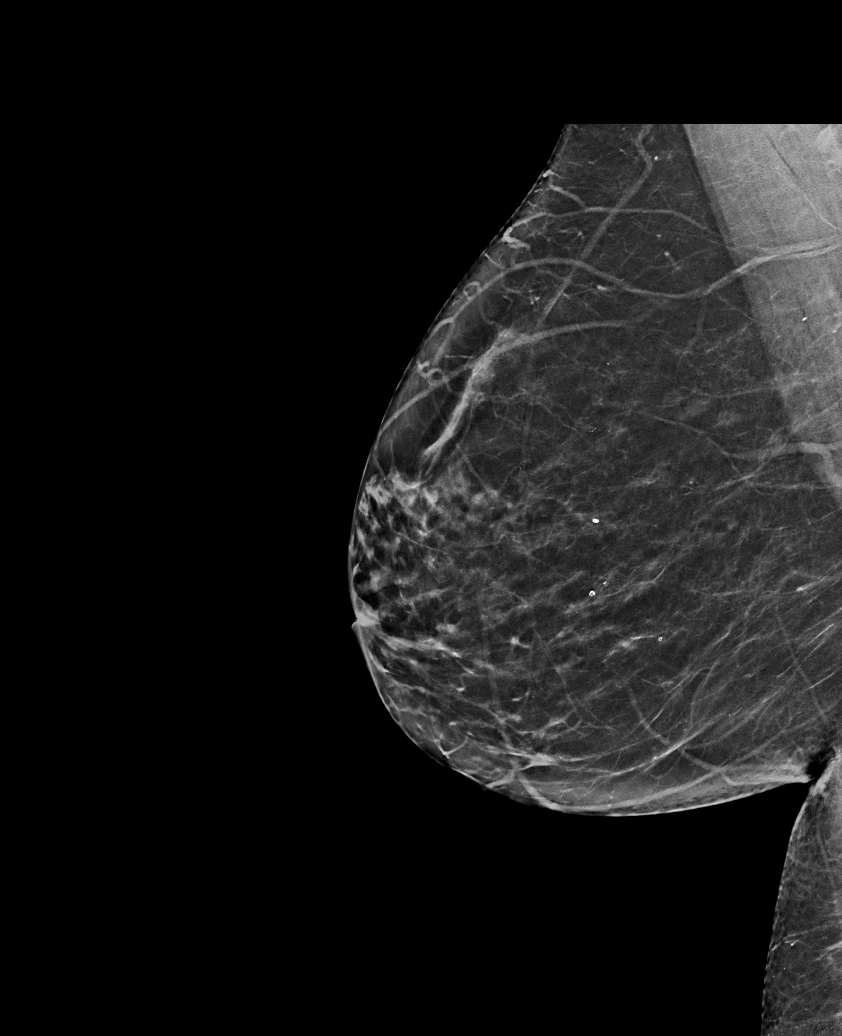

[R CC]
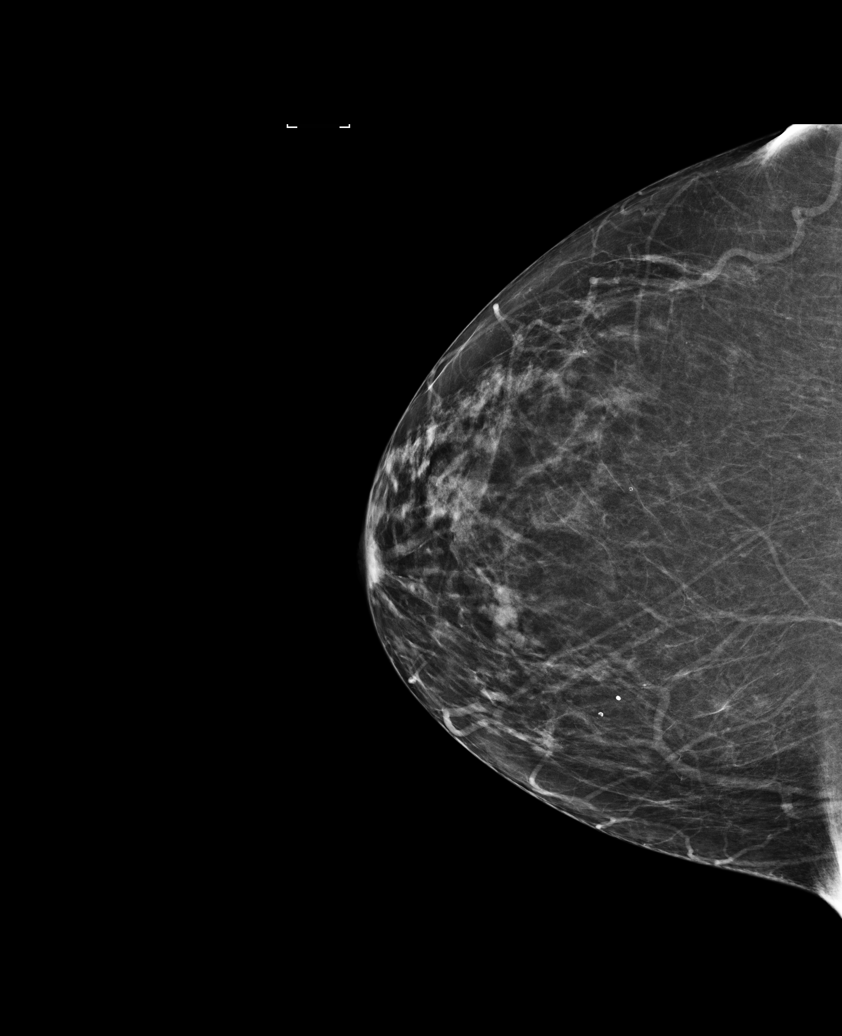

[R MLO]
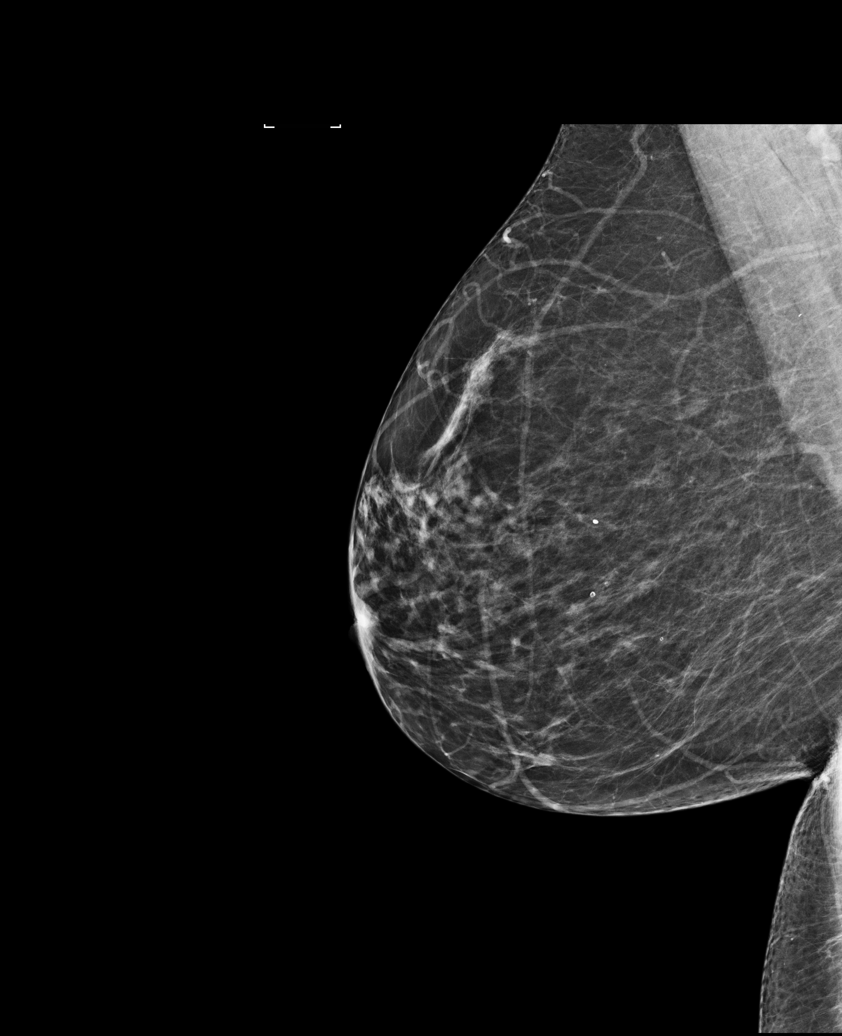

[R MLO tomo · tomo slice 35/68.0]
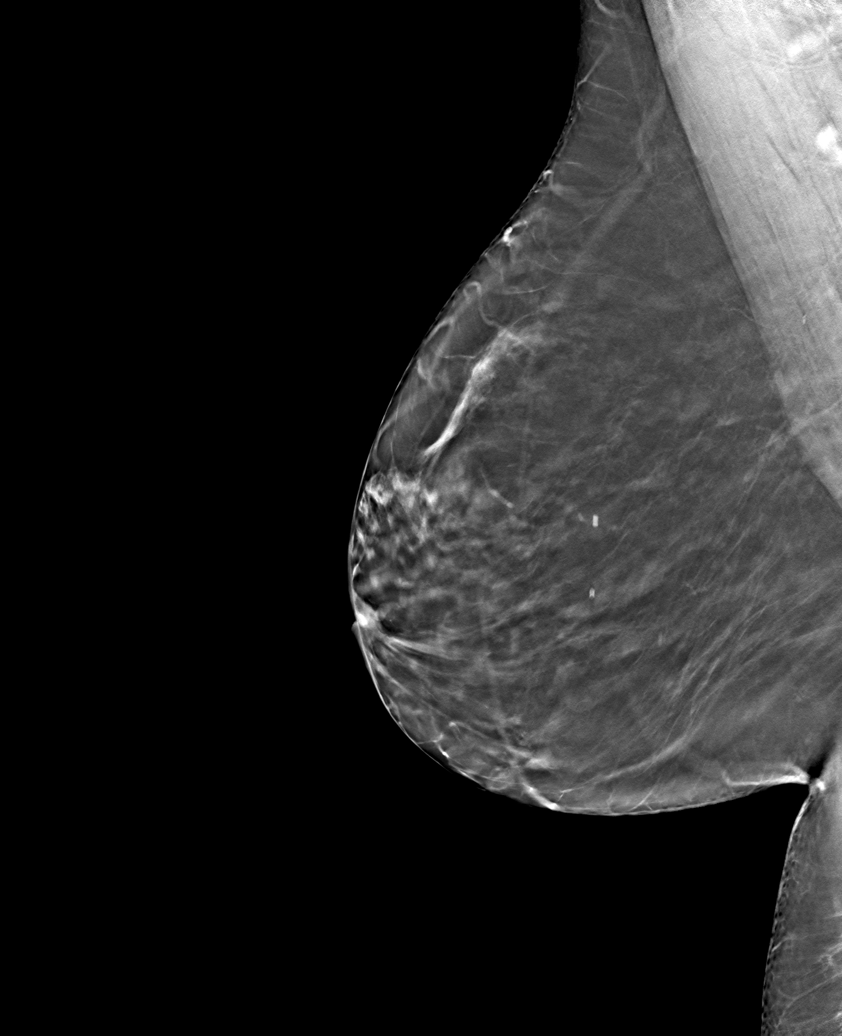

[R CC tomo · tomo slice 34/67.0]
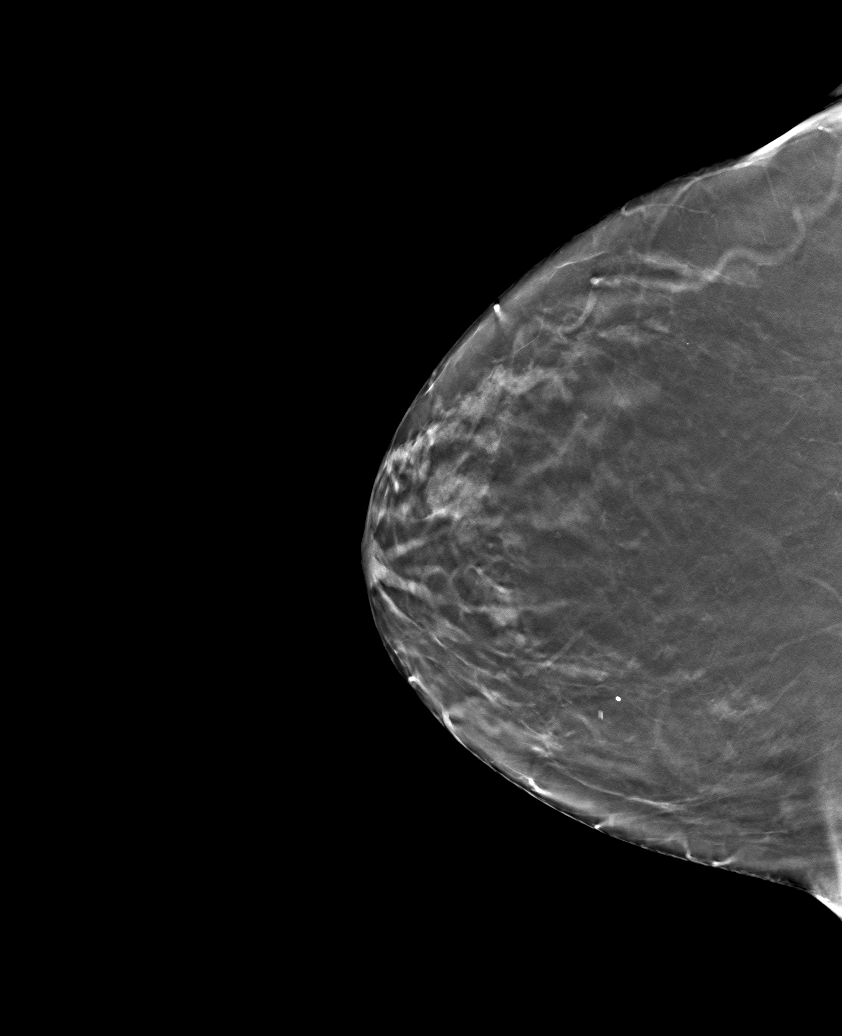

[6 of 14 positions shown; findings below may reference images not displayed]

ACR Breast Density Category b: There are scattered areas of
fibroglandular density.
FINDINGS: There are no findings suspicious for malignancy. Images were
processed with CAD.
IMPRESSION: No mammographic evidence of malignancy. A result letter of this
screening mammogram will be mailed directly to the patient.

RECOMMENDATION:
Screening mammogram in one year. (Code:KN-M-CWK)

BI-RADS CATEGORY  1: Negative.

## 2017-08-15 IMAGING — US US CAROTID DUPLEX BILAT
1 series · 13 of 24 positions shown · non-contrast
Comparison: none

CLINICAL DATA: Syncope.

EXAM:
BILATERAL CAROTID DUPLEX ULTRASOUND
TECHNIQUE: Gray scale imaging, color Doppler and duplex ultrasound were
performed of bilateral carotid and vertebral arteries in the neck.

[Series 1: us carotid duplex bilat · 13 of 65 slices shown]
[im 1/65]
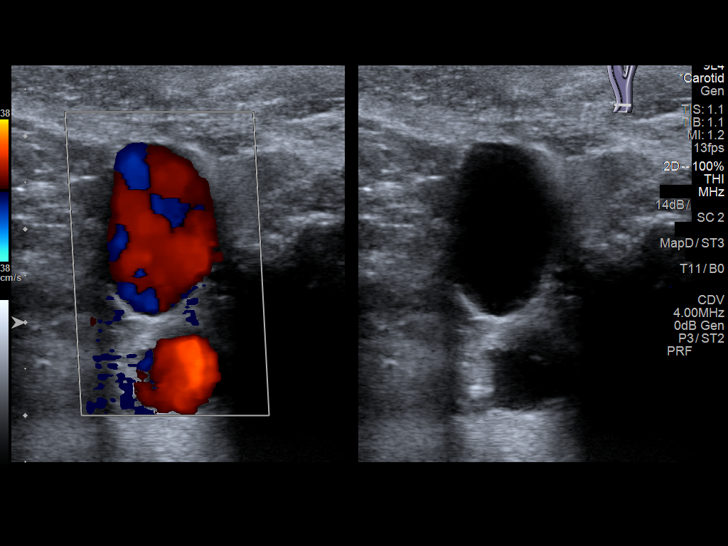
[im 6/65]
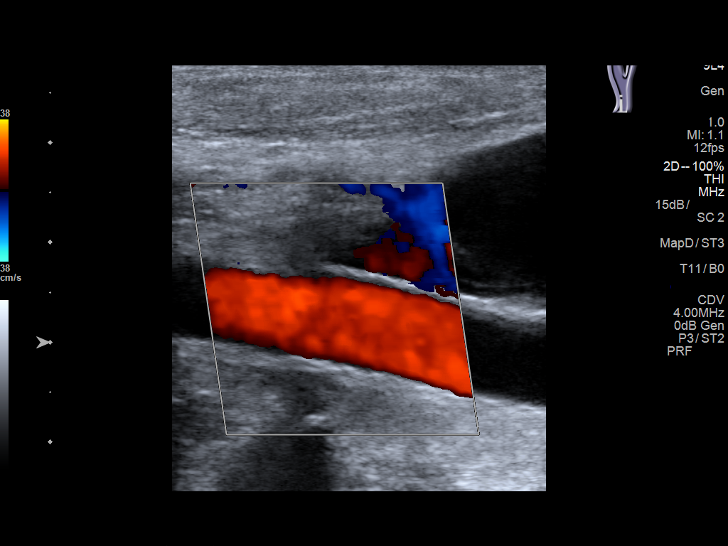
[im 12/65]
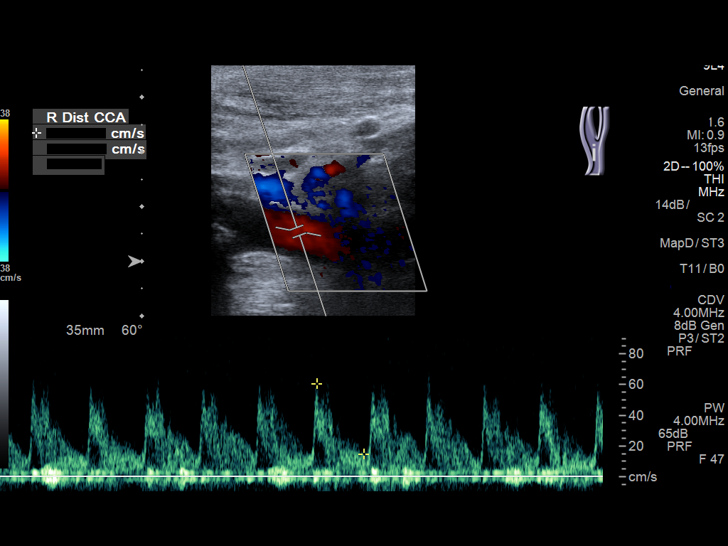
[im 17/65]
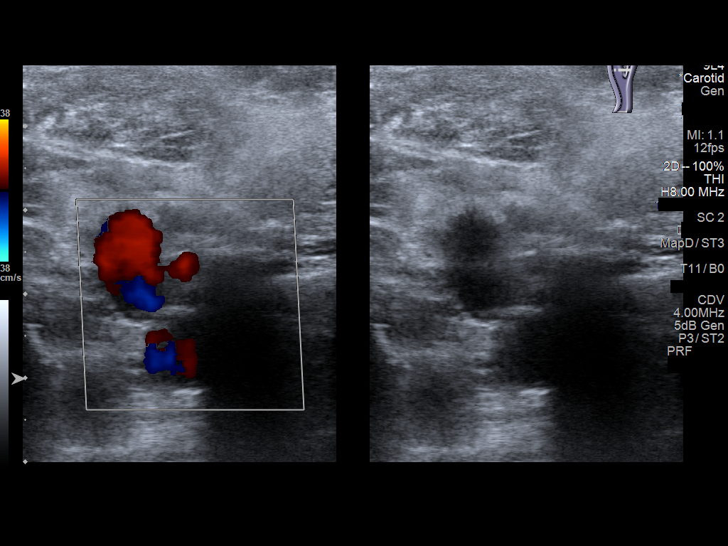
[im 23/65]
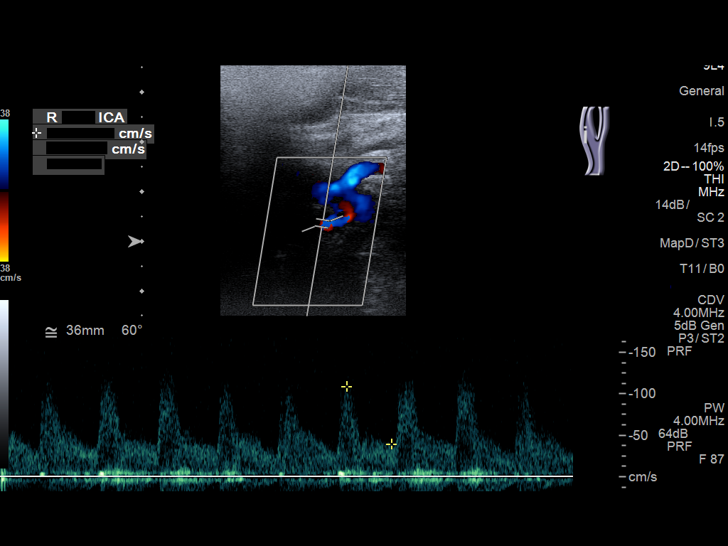
[im 28/65]
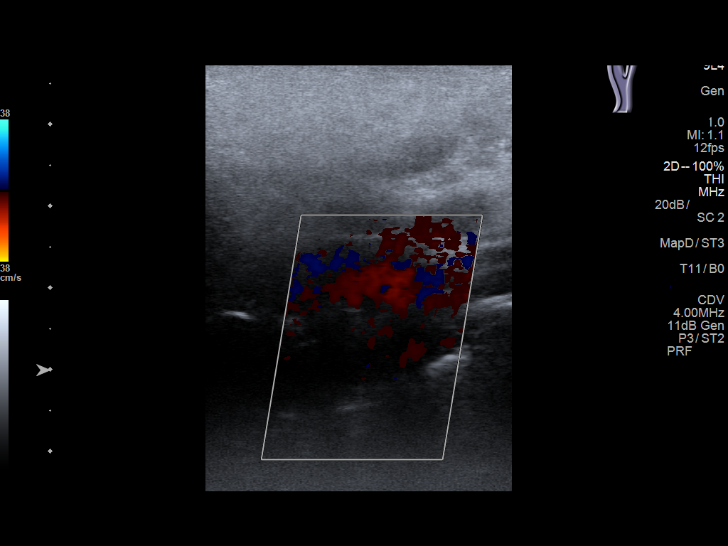
[im 34/65]
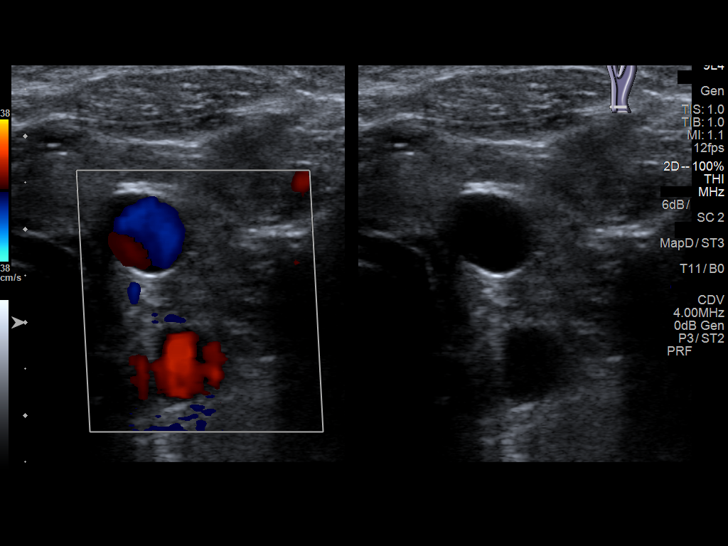
[im 37/65]
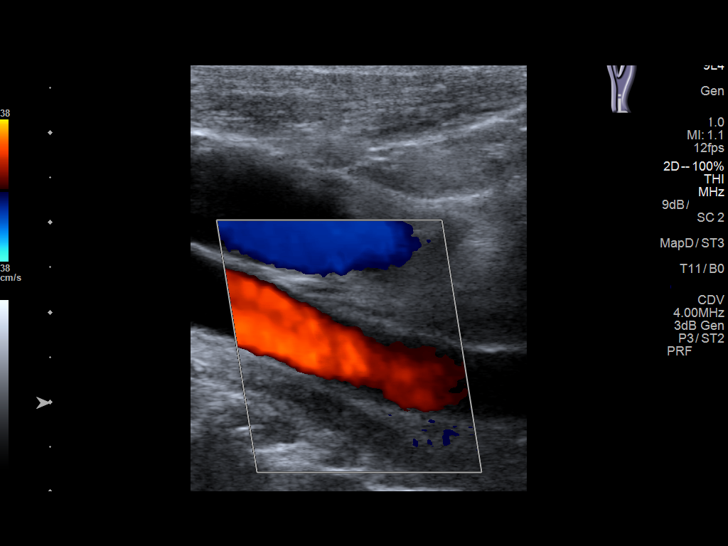
[im 42/65]
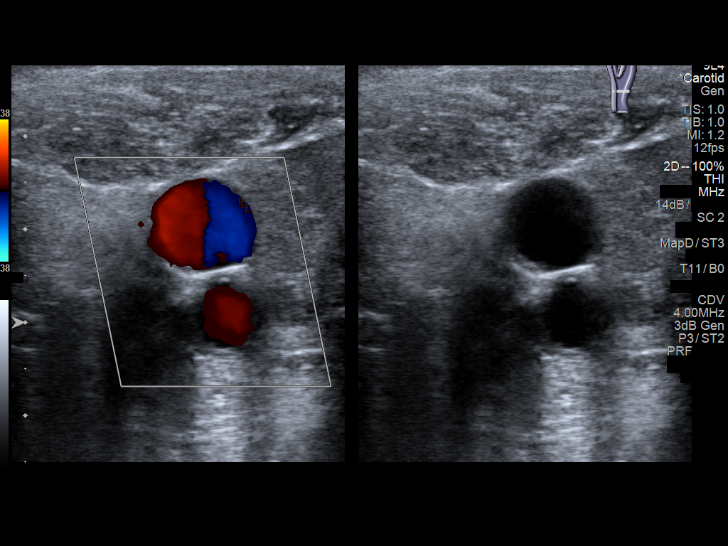
[im 48/65]
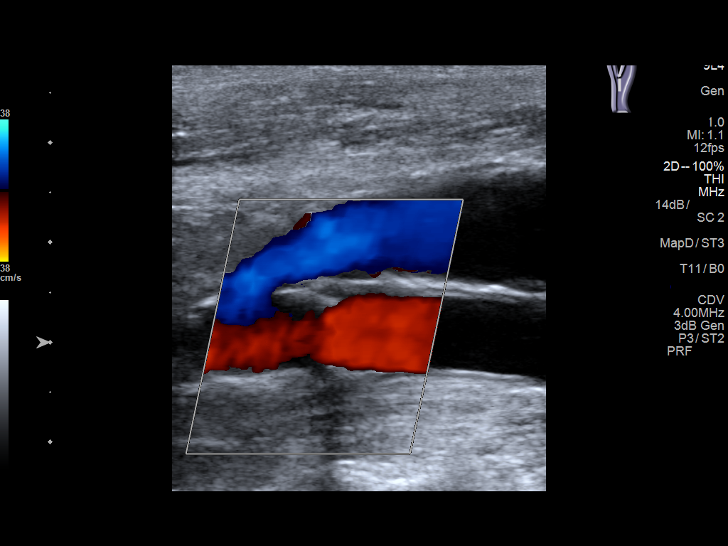
[im 53/65]
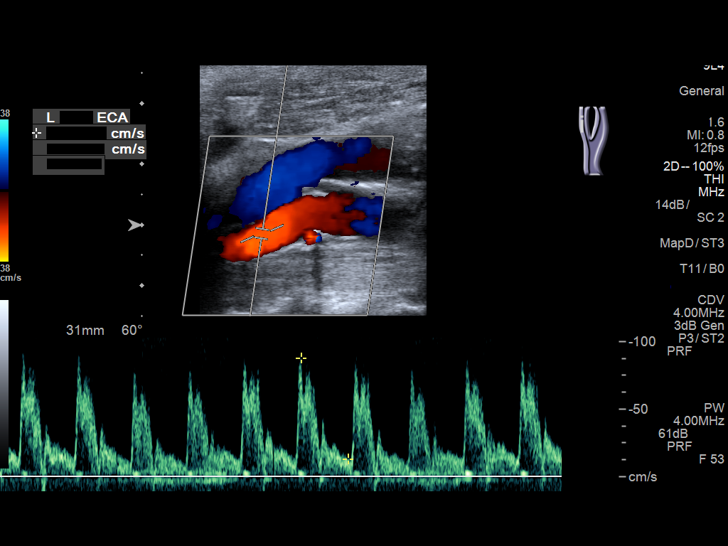
[im 59/65]
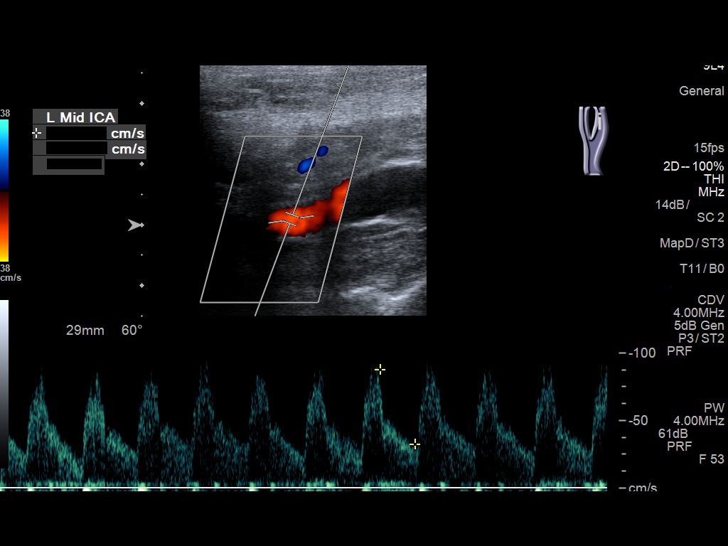
[im 65/65]
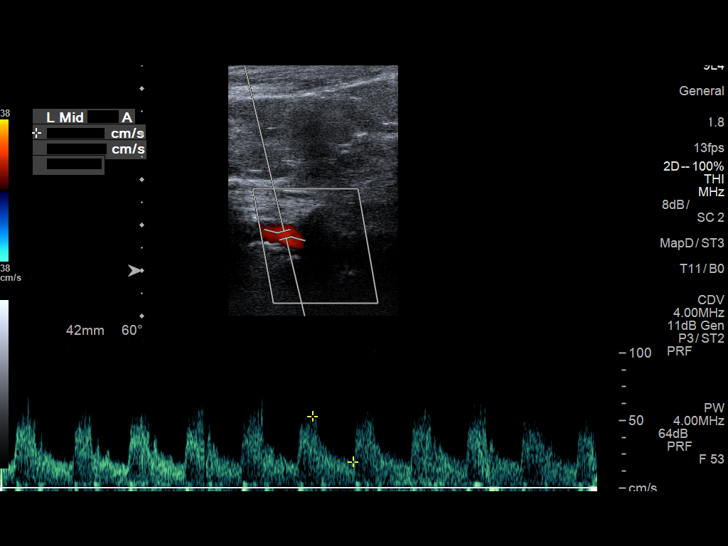

[13 of 24 positions shown; findings below may reference images not displayed]

FINDINGS: Criteria: Quantification of carotid stenosis is based on velocity
parameters that correlate the residual internal carotid diameter
with NASCET-based stenosis levels, using the diameter of the distal
internal carotid lumen as the denominator for stenosis measurement.

The following velocity measurements were obtained:

RIGHT

ICA:  120 cm/sec

CCA:  100 cm/sec

SYSTOLIC ICA/CCA RATIO:

DIASTOLIC ICA/CCA RATIO:

ECA:  83 cm/sec

LEFT

ICA:  88 cm/sec

CCA:  85 cm/sec

SYSTOLIC ICA/CCA RATIO:

DIASTOLIC ICA/CCA RATIO:

ECA:  88 cm/sec

RIGHT CAROTID ARTERY: Echogenic plaque at the right carotid bulb.
External carotid artery is patent with normal waveform. Normal
waveforms and velocities in the internal carotid artery.
Visualization of the internal carotid artery is limited on this
examination.

RIGHT VERTEBRAL ARTERY: Antegrade flow and normal waveform in the
right vertebral artery.

LEFT CAROTID ARTERY: Echogenic plaque at the left carotid bulb.
External carotid artery is patent with normal waveform. Normal
waveforms and velocities in the internal carotid artery.

LEFT VERTEBRAL ARTERY: Antegrade flow and normal waveform in the
left vertebral artery.
IMPRESSION: Atherosclerotic disease at the carotid bulbs bilaterally. Estimated
degree of stenosis in the internal carotid arteries is less than 50%
bilaterally.

Patent vertebral arteries.

## 2017-08-27 IMAGING — DX DG CHEST 1V PORT
1 series · 1 of 1 positions shown · non-contrast
Comparison: 11/08/2015 chest x-ray.  Chest CT 02/28/2016.

CLINICAL DATA: Post bronchoscopy

EXAM:
PORTABLE CHEST 1 VIEW

[chest ap]
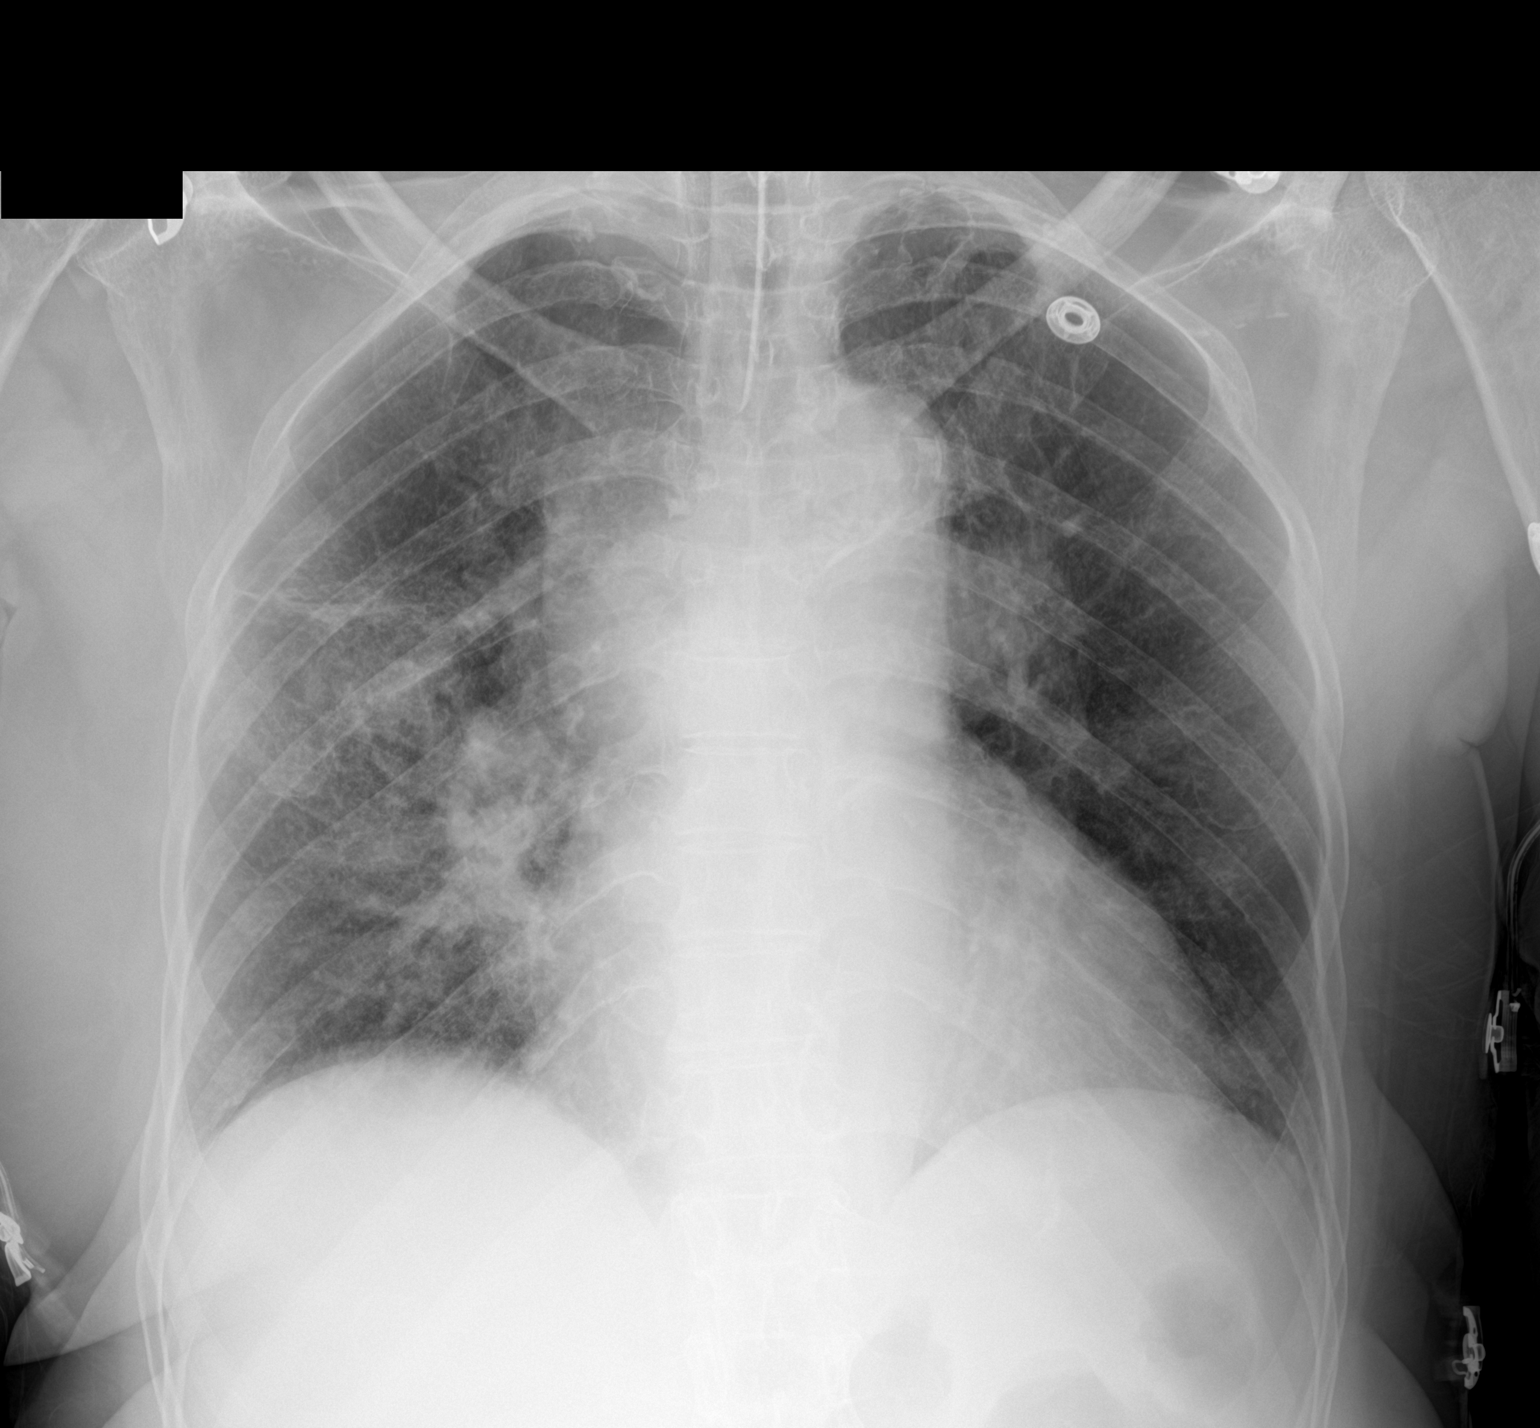

[1 of 1 positions shown; findings below may reference images not displayed]

FINDINGS: Endotracheal tube is 5.5 cm above the carina. Increasing right
perihilar opacity, possibly related to bronchoscopy. No
pneumothorax. Mild cardiomegaly. No effusions or acute bony
abnormality.
IMPRESSION: Increasing right perihilar opacity, likely related to bronchoscopy.
No pneumothorax.

## 2017-09-09 ENCOUNTER — Ambulatory Visit: Payer: BLUE CROSS/BLUE SHIELD

## 2017-09-11 ENCOUNTER — Inpatient Hospital Stay: Payer: BLUE CROSS/BLUE SHIELD

## 2017-09-11 ENCOUNTER — Inpatient Hospital Stay: Payer: BLUE CROSS/BLUE SHIELD | Admitting: Internal Medicine

## 2017-09-11 ENCOUNTER — Ambulatory Visit: Payer: BLUE CROSS/BLUE SHIELD | Admitting: Radiation Oncology

## 2017-09-12 ENCOUNTER — Ambulatory Visit
Admission: RE | Admit: 2017-09-12 | Discharge: 2017-09-12 | Disposition: A | Discharge status: Home / Self Care | Payer: BLUE CROSS/BLUE SHIELD | Source: Ambulatory Visit | Attending: Internal Medicine | Admitting: Internal Medicine

## 2017-09-12 DIAGNOSIS — I2510 Atherosclerotic heart disease of native coronary artery without angina pectoris: Secondary | ICD-10-CM | POA: Insufficient documentation

## 2017-09-12 DIAGNOSIS — C3411 Malignant neoplasm of upper lobe, right bronchus or lung: Secondary | ICD-10-CM | POA: Diagnosis present

## 2017-09-12 DIAGNOSIS — J439 Emphysema, unspecified: Secondary | ICD-10-CM | POA: Insufficient documentation

## 2017-09-12 DIAGNOSIS — I700 Atherosclerosis of aorta: Secondary | ICD-10-CM | POA: Diagnosis not present

## 2017-09-12 DIAGNOSIS — K760 Fatty (change of) liver, not elsewhere classified: Secondary | ICD-10-CM | POA: Diagnosis not present

## 2017-09-12 LAB — POCT I-STAT CREATININE: CREATININE: 0.9 mg/dL (ref 0.44–1.00)

## 2017-09-12 MED ORDER — IOPAMIDOL (ISOVUE-300) INJECTION 61%
75.0000 mL | Freq: Once | INTRAVENOUS | Status: AC | PRN
  Administered 2017-09-12: 75 mL via INTRAVENOUS

## 2017-09-18 ENCOUNTER — Other Ambulatory Visit: Payer: Self-pay | Admitting: *Deleted

## 2017-09-18 DIAGNOSIS — C3411 Malignant neoplasm of upper lobe, right bronchus or lung: Secondary | ICD-10-CM

## 2017-09-19 ENCOUNTER — Inpatient Hospital Stay: Payer: BLUE CROSS/BLUE SHIELD | Attending: Internal Medicine

## 2017-09-19 ENCOUNTER — Other Ambulatory Visit: Payer: Self-pay | Admitting: *Deleted

## 2017-09-19 ENCOUNTER — Inpatient Hospital Stay (HOSPITAL_BASED_OUTPATIENT_CLINIC_OR_DEPARTMENT_OTHER): Payer: BLUE CROSS/BLUE SHIELD | Admitting: Internal Medicine

## 2017-09-19 ENCOUNTER — Ambulatory Visit
Admission: RE | Admit: 2017-09-19 | Discharge: 2017-09-19 | Disposition: A | Discharge status: Home / Self Care | Payer: BLUE CROSS/BLUE SHIELD | Source: Ambulatory Visit | Attending: Radiation Oncology | Admitting: Radiation Oncology

## 2017-09-19 VITALS — BP 138/88 | HR 98 | Temp 96.3°F | Wt 135.0 lb

## 2017-09-19 DIAGNOSIS — R634 Abnormal weight loss: Secondary | ICD-10-CM | POA: Diagnosis not present

## 2017-09-19 DIAGNOSIS — Z9981 Dependence on supplemental oxygen: Secondary | ICD-10-CM

## 2017-09-19 DIAGNOSIS — Z79899 Other long term (current) drug therapy: Secondary | ICD-10-CM | POA: Diagnosis not present

## 2017-09-19 DIAGNOSIS — Z853 Personal history of malignant neoplasm of breast: Secondary | ICD-10-CM | POA: Insufficient documentation

## 2017-09-19 DIAGNOSIS — R0602 Shortness of breath: Secondary | ICD-10-CM

## 2017-09-19 DIAGNOSIS — F419 Anxiety disorder, unspecified: Secondary | ICD-10-CM | POA: Insufficient documentation

## 2017-09-19 DIAGNOSIS — C3411 Malignant neoplasm of upper lobe, right bronchus or lung: Secondary | ICD-10-CM | POA: Diagnosis not present

## 2017-09-19 DIAGNOSIS — Z170 Estrogen receptor positive status [ER+]: Secondary | ICD-10-CM | POA: Diagnosis not present

## 2017-09-19 DIAGNOSIS — J449 Chronic obstructive pulmonary disease, unspecified: Secondary | ICD-10-CM | POA: Insufficient documentation

## 2017-09-19 DIAGNOSIS — Z9013 Acquired absence of bilateral breasts and nipples: Secondary | ICD-10-CM | POA: Insufficient documentation

## 2017-09-19 DIAGNOSIS — R011 Cardiac murmur, unspecified: Secondary | ICD-10-CM | POA: Diagnosis not present

## 2017-09-19 DIAGNOSIS — D649 Anemia, unspecified: Secondary | ICD-10-CM

## 2017-09-19 DIAGNOSIS — Z803 Family history of malignant neoplasm of breast: Secondary | ICD-10-CM

## 2017-09-19 DIAGNOSIS — Z7951 Long term (current) use of inhaled steroids: Secondary | ICD-10-CM | POA: Insufficient documentation

## 2017-09-19 DIAGNOSIS — Z923 Personal history of irradiation: Secondary | ICD-10-CM | POA: Insufficient documentation

## 2017-09-19 DIAGNOSIS — C50812 Malignant neoplasm of overlapping sites of left female breast: Secondary | ICD-10-CM | POA: Insufficient documentation

## 2017-09-19 DIAGNOSIS — I700 Atherosclerosis of aorta: Secondary | ICD-10-CM

## 2017-09-19 DIAGNOSIS — F329 Major depressive disorder, single episode, unspecified: Secondary | ICD-10-CM | POA: Diagnosis not present

## 2017-09-19 DIAGNOSIS — Z87891 Personal history of nicotine dependence: Secondary | ICD-10-CM | POA: Insufficient documentation

## 2017-09-19 DIAGNOSIS — Z9221 Personal history of antineoplastic chemotherapy: Secondary | ICD-10-CM | POA: Diagnosis not present

## 2017-09-19 DIAGNOSIS — Z79811 Long term (current) use of aromatase inhibitors: Secondary | ICD-10-CM | POA: Insufficient documentation

## 2017-09-19 DIAGNOSIS — Z85118 Personal history of other malignant neoplasm of bronchus and lung: Secondary | ICD-10-CM | POA: Diagnosis not present

## 2017-09-19 LAB — CBC WITH DIFFERENTIAL/PLATELET
Basophils Absolute: 0.2 10*3/uL — ABNORMAL HIGH (ref 0–0.1)
Basophils Relative: 3 %
EOS ABS: 0.2 10*3/uL (ref 0–0.7)
EOS PCT: 3 %
HCT: 33.3 % — ABNORMAL LOW (ref 35.0–47.0)
Hemoglobin: 10.9 g/dL — ABNORMAL LOW (ref 12.0–16.0)
LYMPHS ABS: 1.9 10*3/uL (ref 1.0–3.6)
Lymphocytes Relative: 32 %
MCH: 32 pg (ref 26.0–34.0)
MCHC: 32.8 g/dL (ref 32.0–36.0)
MCV: 97.5 fL (ref 80.0–100.0)
MONO ABS: 0.5 10*3/uL (ref 0.2–0.9)
MONOS PCT: 9 %
Neutro Abs: 3.3 10*3/uL (ref 1.4–6.5)
Neutrophils Relative %: 53 %
PLATELETS: 348 10*3/uL (ref 150–440)
RBC: 3.41 MIL/uL — ABNORMAL LOW (ref 3.80–5.20)
RDW: 15.5 % — AB (ref 11.5–14.5)
WBC: 6.1 10*3/uL (ref 3.6–11.0)

## 2017-09-19 LAB — COMPREHENSIVE METABOLIC PANEL
ALBUMIN: 3.9 g/dL (ref 3.5–5.0)
ALT: 47 U/L (ref 14–54)
AST: 75 U/L — AB (ref 15–41)
Alkaline Phosphatase: 119 U/L (ref 38–126)
Anion gap: 9 (ref 5–15)
BUN: 12 mg/dL (ref 6–20)
CHLORIDE: 101 mmol/L (ref 101–111)
CO2: 24 mmol/L (ref 22–32)
CREATININE: 0.69 mg/dL (ref 0.44–1.00)
Calcium: 8.9 mg/dL (ref 8.9–10.3)
GFR calc Af Amer: >60 mL/min (ref >60)
GLUCOSE: 95 mg/dL (ref 65–99)
Potassium: 4.4 mmol/L (ref 3.5–5.1)
Sodium: 134 mmol/L — ABNORMAL LOW (ref 135–145)
Total Bilirubin: 0.8 mg/dL (ref 0.3–1.2)
Total Protein: 7.8 g/dL (ref 6.5–8.1)

## 2017-09-19 NOTE — Assessment & Plan Note (Signed)
#  Right upper lung nodule-s/p Bx- proven adenocarcinoma. S/p RT dec 2017. No evidence of any clinical progression.  # CT scan NOV 2018- Stable right upper lobe; previous right upper lobe 2 cm nodule improved [s/p RT in dec2017] ; however inferior to the previous radiation scarring -solid component noted question radiation versus recurrence.  Difficulty biopsy.  Recommend close surveillance CT scan in 3 months.  #Discussed with patient has progression on subsequent scans-she may need therapies.   # History of ER/PR positive HER-2/neu negative breast cancer stage III [2000]- clinically no evidence of recurrence.  # Anemia- hb 10.5/stable; NOT iron deficient; monitor for now.  # Follow up with a M.D.- few days prior CT scan; in  3 months with labs  # I reviewed the blood work- with the patient in detail; also reviewed the imaging independently [as summarized above]; and with the patient in detail.

## 2017-09-19 NOTE — Progress Notes (Signed)
Radiation Oncology Follow up Note  Name: Danielle [name]   Date:   09/19/2017 MRN:  [id number] DOB: [birth year]    This 61 y.o. female presents to the clinic today for 11 month follow-up status post SB RT for the second time to a right upper lobe non-small cell lung cancer.  REFERRING PROVIDER: Marguerita Merles, MD  HPI: Patient is a 61 year old female now 11 months out status post SB RT for non-small cell lung cancer of right upper lobe. She had previously treated [facility] 2003 for invasive mammary carcinoma receiving chest wall peripheral lymphatic radiation status post left modified radical mastectomy. She had SB RT to her right upper lobe prior to that. She is seen today in routine follow-up and is doing well. She specifically denies any worsening of her pulmonary functions cough hemoptysis or chest tightness.. She recently had a CT scan of her chest showing radiation changes no evidence of progression of disease is noted on my review. She does have some soft tissue thickening in the inferior right upper lobe most likely rate related to radiation change.  COMPLICATIONS OF TREATMENT: none  FOLLOW UP COMPLIANCE: keeps appointments   PHYSICAL EXAM:  There were no vitals taken for this visit. Well-developed well-nourished patient in NAD. HEENT reveals PERLA, EOMI, discs not visualized.  Oral cavity is clear. No oral mucosal lesions are identified. Neck is clear without evidence of cervical or supraclavicular adenopathy. Lungs are clear to A&P. Cardiac examination is essentially unremarkable with regular rate and rhythm without murmur rub or thrill. Abdomen is benign with no organomegaly or masses noted. Motor sensory and DTR levels are equal and symmetric in the upper and lower extremities. Cranial nerves II through XII are grossly intact. Proprioception is intact. No peripheral adenopathy or edema is identified. No motor or sensory levels are noted. Crude visual fields are within normal  range.  RADIOLOGY RESULTS: CT scan is reviewed and compatible with the above-stated findings  PLAN: Present time she continues to do well with no evidence of disease. I've asked to see her back in 1 year for follow-up. I've ordered a CT scan prior to that visit and will pay attention to the areas of soft tissue thickening in the inferior right upper lobe. Patient is to call with any concerns.  I would like to take this opportunity to thank you for allowing me to participate in the care of your patient.Armstead Peaks., MD

## 2017-09-19 NOTE — Progress Notes (Signed)
Patient is here today for a follow up today. Patient c/o of nausea and having no appetite.

## 2017-09-19 NOTE — Progress Notes (Signed)
[facility] OFFICE PROGRESS NOTE  Patient Care Team: Elisabeth Cara, NP as PCP - General (Nurse Practitioner)  Cancer Staging Breast cancer in female [facility]) Staging form: Breast, AJCC 7th Edition - Clinical: Stage IIIA (T3, N1, M0) - Signed by [name], NP on 02/21/2016    Oncology History   # 2016- JAN RUL 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 A. LUNG MASS, RIGHT; CT-GUIDED BIOPSY: - ADENOCARCINOMA, ACINAR AND PAPILLARY MORPHOLOGIES. S/p RT [finished Dec 2017] s/p RT [facility] 2017]  # 2000-  Left breast cancer [T3N1- stage III] mastecomy s/p RT; s/p chemo; Tamoxifen  # NOV 111 2017- Mol testing [RML- adeno ca]         Breast cancer in female ([place])   07/24/1999 Initial Diagnosis    Breast cancer in female ([place]) T3 N1 M0 tumor ER/PR positive      08/24/1999 -  Anti-estrogen oral therapy    Started tamoxifen      08/24/1999 Surgery    Status post modified radical mastectomy of left breast       Chemotherapy          Radiation Therapy         11/24/2003 - 02/20/2009 Anti-estrogen oral therapy    Started Aromasin       Carcinoma of overlapping sites of left breast in female, estrogen receptor positive ([place])   06/22/2016 Initial Diagnosis    Cancer of overlapping sites of left female breast [facility])       Primary cancer of right upper lobe of lung ([place])     INTERVAL HISTORY:  Danielle [name] 61 y.o.  female pleasant patient above history ofBreast cancer; Stage I squamous cell lung cancer; and also history of right middle lobe lung Adenocarcinoma [biopsy September 2017] status post radiation finished December 2017 is here for follow-up/ To review the results of the CT scan.  Patient denies any recent admission to hospital.  Denies any worsening shortness of breath.  She has chronic mild shortness of breath not on home  O2.  Positive for mild weight loss.  No hemoptysis. No chest pain. No headaches. Chronic mild tingling and numbness in the extremity. Not any worse.  REVIEW OF SYSTEMS:  A complete 10 point review of system is done which is negative except mentioned above/history of present illness.   PAST MEDICAL HISTORY :  Past Medical History:  Diagnosis Date  . Anxiety   . Arthritis   . Cancer ([place]) left    breast cancer 2000, chemo tx's with total mastectomy and lymph nodes resected.   . Cancer of right lung ([name]) 02/21/2016   rad tx's.   [name] COPD (chronic obstructive pulmonary disease) ([place])   . Dependence on supplemental oxygen   . Depression   . Heart murmur   . Hypertension   . Lung nodule   . Lymphedema   . Shortness of breath dyspnea    with exertion  . Status post chemotherapy 2001   left breast cancer  . Status post radiation therapy 2001   left breast cancer    PAST SURGICAL HISTORY :   Past Surgical History:  Procedure Laterality Date  . Breast Biospy Left    [facility]  . BREAST SURGERY    . DILATION AND CURETTAGE OF UTERUS    . ELECTROMAGNETIC NAVIGATION BROCHOSCOPY Right  04/11/2016   Procedure: ELECTROMAGNETIC NAVIGATION BRONCHOSCOPY;  Surgeon: Vilinda Boehringer, MD;  Location: [facility] ORS;  Service: Cardiopulmonary;  Laterality: Right;  . history of colonoscopy]    . LUNG BIOPSY    . MASTECTOMY Left    2000, [facility]  . ROTATOR CUFF REPAIR Right    [facility]    FAMILY HISTORY :   Family History  Problem Relation Age of Onset  . Breast cancer Mother 60  . Cancer Mother        Breast   . Cirrhosis Father   . Breast cancer Paternal Aunt 21  . Cancer Maternal Aunt        Breast     SOCIAL HISTORY:   Social History   Tobacco Use  . Smoking status: Former Smoker    Packs/day: 0.50    Years: 20.00    Pack years: 10.00    Types: Cigarettes    Last attempt to quit: 07/02/2012    Years since quitting: 5.2  . Smokeless tobacco: Never Used  . Tobacco comment: quit 2014  Substance Use  Topics  . Alcohol use: Yes    Comment: Occasionally  . Drug use: No    ALLERGIES:  has No Known Allergies.  MEDICATIONS:  Current Outpatient Medications  Medication Sig Dispense Refill  . albuterol (PROAIR HFA) 108 (90 BASE) MCG/ACT inhaler Inhale 1-2 puffs into the lungs every 6 (six) hours as needed for wheezing or shortness of breath.     [name] amLODipine (NORVASC) 10 MG tablet Take 10 mg by mouth daily. for blood pressure  3  . budesonide-formoterol (SYMBICORT) 160-4.5 MCG/ACT inhaler Inhale 2 puffs into the lungs 2 (two) times daily.    . celecoxib (CELEBREX) 200 MG capsule Take 1 capsule (200 mg total) by mouth daily. 30 capsule 0  . citalopram (CELEXA) 20 MG tablet Take 20 mg by mouth daily.     [name] losartan (COZAAR) 100 MG tablet Take 1 tablet (100 mg total) by mouth daily. 30 tablet 0  . meloxicam (MOBIC) 7.5 MG tablet Take 7.5 mg by mouth daily.     . potassium chloride SA (K-DUR,KLOR-CON) 20 MEQ tablet Take 1 tablet (20 mEq total) by mouth daily. 30 tablet 6  . tiotropium (SPIRIVA) 18 MCG inhalation capsule Place into inhaler and inhale.    [name] HYDROcodone-acetaminophen (NORCO/VICODIN) 5-325 MG tablet Take 1 tablet by mouth every 4 (four) hours as needed for moderate pain. (Patient not taking: Reported on 09/19/2017) 30 tablet 0   No current facility-administered medications for this visit.     PHYSICAL EXAMINATION: ECOG PERFORMANCE STATUS: 0 - Asymptomatic  BP 138/88 (BP Location: Right Arm, Patient Position: Sitting)   Pulse 98   Temp (!) 96.3 F (35.7 C) (Tympanic)   Wt 135 lb (61.2 kg)   BMI 21.79 kg/m   Filed Weights   09/19/17 0907  Weight: 135 lb (61.2 kg)    GENERAL: Well-nourished well-developed; Alert, no distress and comfortable.   Alone.  EYES: no pallor or icterus OROPHARYNX: no thrush or ulceration; good dentition  NECK: supple, no masses felt LYMPH:  no palpable lymphadenopathy in the cervical, axillary or inguinal regions LUNGS: clear to auscultation  and  No wheeze or crackles HEART/CVS: regular rate & rhythm and no murmurs; No lower extremity edema ABDOMEN:abdomen soft, non-tender and normal bowel sounds Musculoskeletal:no cyanosis of digits and no clubbing  PSYCH: alert & oriented x 3 with fluent speech NEURO: no focal motor/sensory deficits SKIN:  no rashes or significant lesions  LABORATORY DATA:  I have reviewed the data as listed    Component Value Date/Time   NA 134 (L) 09/19/2017 0846   NA 132 (L) 02/09/2015 1100   K 4.4 09/19/2017 0846   K 3.8 02/09/2015 1100   CL 101 09/19/2017 0846   CL 95 (L) 02/09/2015 1100   CO2 24 09/19/2017 0846   CO2 29 02/09/2015 1100   GLUCOSE 95 09/19/2017 0846   GLUCOSE 105 (H) 02/09/2015 1100   BUN 12 09/19/2017 0846   BUN 16 02/09/2015 1100   CREATININE 0.69 09/19/2017 0846   CREATININE 0.81 02/09/2015 1100   CALCIUM 8.9 09/19/2017 0846   CALCIUM 9.1 02/09/2015 1100   PROT 7.8 09/19/2017 0846   PROT 7.7 02/09/2015 1100   ALBUMIN 3.9 09/19/2017 0846   ALBUMIN 4.3 02/09/2015 1100   AST 75 (H) 09/19/2017 0846   AST 29 02/09/2015 1100   ALT 47 09/19/2017 0846   ALT 20 02/09/2015 1100   ALKPHOS 119 09/19/2017 0846   ALKPHOS 69 02/09/2015 1100   BILITOT 0.8 09/19/2017 0846   BILITOT 0.9 02/09/2015 1100   GFRNONAA >60 09/19/2017 0846   GFRNONAA >60 02/09/2015 1100   GFRAA >60 09/19/2017 0846   GFRAA >60 02/09/2015 1100    No results found for: SPEP, UPEP  Lab Results  Component Value Date   WBC 6.1 09/19/2017   NEUTROABS 3.3 09/19/2017   HGB 10.9 (L) 09/19/2017   HCT 33.3 (L) 09/19/2017   MCV 97.5 09/19/2017   PLT 348 09/19/2017      Chemistry      Component Value Date/Time   NA 134 (L) 09/19/2017 0846   NA 132 (L) 02/09/2015 1100   K 4.4 09/19/2017 0846   K 3.8 02/09/2015 1100   CL 101 09/19/2017 0846   CL 95 (L) 02/09/2015 1100   CO2 24 09/19/2017 0846   CO2 29 02/09/2015 1100   BUN 12 09/19/2017 0846   BUN 16 02/09/2015 1100   CREATININE 0.69 09/19/2017  0846   CREATININE 0.81 02/09/2015 1100      Component Value Date/Time   CALCIUM 8.9 09/19/2017 0846   CALCIUM 9.1 02/09/2015 1100   ALKPHOS 119 09/19/2017 0846   ALKPHOS 69 02/09/2015 1100   AST 75 (H) 09/19/2017 0846   AST 29 02/09/2015 1100   ALT 47 09/19/2017 0846   ALT 20 02/09/2015 1100   BILITOT 0.8 09/19/2017 0846   BILITOT 0.9 02/09/2015 1100    IMPRESSION:  IMPRESSION: 1. Today's study demonstrates a positive response to therapy. All of the previously noted lung lesions appear smaller, including the somewhat ill-defined right upper lobe sub solid mass, as detailed above. 2. Diffuse bronchial wall thickening with moderate centrilobular and paraseptal emphysema; imaging findings suggestive of underlying COPD. 3. Aortic atherosclerosis, in addition to left main and 3 vessel coronary artery disease. Please note that although the presence of coronary artery calcium documents the presence of coronary artery disease, the severity of this disease and any potential stenosis cannot be assessed on this non-gated CT examination. Assessment for potential risk factor modification, dietary therapy or pharmacologic therapy may be warranted, if clinically indicated. 4. There are calcifications of the aortic valve and mitral annulus. Echocardiographic correlation for evaluation of potential valvular dysfunction may be warranted if clinically indicated.   Electronically Signed   By: Vinnie Langton M.D.   On: 03/01/2017 15:42   RADIOGRAPHIC STUDIES: I have personally reviewed the radiological images as listed and agreed with the findings in the  report. No results found.   ASSESSMENT & PLAN:  Primary cancer of right upper lobe of lung ([place]) # Right upper lung nodule-s/p Bx- proven adenocarcinoma. S/p RT dec 2017. No evidence of any clinical progression.  # CT scan NOV 2018- Stable right upper lobe; previous right upper lobe 2 cm nodule improved [s/p RT in dec2017] ; however  inferior to the previous radiation scarring -solid component noted question radiation versus recurrence.  Difficulty biopsy.  Recommend close surveillance CT scan in 3 months.  #Discussed with patient has progression on subsequent scans-she may need therapies.   # History of ER/PR positive HER-2/neu negative breast cancer stage III [2000]- clinically no evidence of recurrence.  # Anemia- hb 10.5/stable; NOT iron deficient; monitor for now.  # Follow up with a M.D.- few days prior CT scan; in  3 months with labs  # I reviewed the blood work- with the patient in detail; also reviewed the imaging independently [as summarized above]; and with the patient in detail.   Orders Placed This Encounter  Procedures  . CT CHEST W CONTRAST    Standing Status:   Future    Standing Expiration Date:   12/18/2017    Scheduling Instructions:     1-2 days prio to MD visit/ in 3 months    Order Specific Question:   If indicated for the ordered procedure, I authorize the administration of contrast media per Radiology protocol    Answer:   Yes    Order Specific Question:   Preferred imaging location?    Answer:   [facility]    Order Specific Question:   Radiology Contrast Protocol - do NOT remove file path    Answer:   [email or web address]  . CBC with Differential/Platelet    Standing Status:   Future    Standing Expiration Date:   09/19/2018  . Comprehensive metabolic panel    Standing Status:   Future    Standing Expiration Date:   09/19/2018   All questions were answered. The patient knows to call the clinic with any problems, questions or concerns.      [name], MD 09/19/2017 9:55 AM

## 2017-09-27 ENCOUNTER — Ambulatory Visit: Payer: BLUE CROSS/BLUE SHIELD | Admitting: Radiation Oncology

## 2017-11-28 IMAGING — PT NM PET TUM IMG RESTAG (PS) SKULL BASE T - THIGH
1 of 10 series · 1 of 25 positions shown · non-contrast
Comparison: PET-CT 08/15/2015.  Chest CT 11/10/2015 and 02/28/2016.

CLINICAL DATA: Subsequent treatment strategy for right lung
squamous cell carcinoma diagnosed in 4049. History of left breast
cancer. Enlarging right lung sub solid lesion on CT.

EXAM:
NUCLEAR MEDICINE PET SKULL BASE TO THIGH
TECHNIQUE: 12.18 mCi F-18 FDG was injected intravenously. Full-ring PET imaging
was performed from the skull base to thigh after the radiotracer. CT
data was obtained and used for attenuation correction and anatomic
localization.
FASTING BLOOD GLUCOSE:  Value: 78 mg/dl

[Series 4: pet wb (ac) · axial · 5.0mm · 4.07mm/px · 1 of 251 slices shown]
[im 126/251]
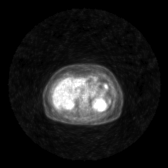

[1 of 25 positions shown; findings below may reference images not displayed]

FINDINGS: NECK

No hypermetabolic cervical lymph nodes are identified.There are no
lesions of the pharyngeal mucosal space.

CHEST

There are no hypermetabolic mediastinal, hilar or axillary lymph
nodes. The sub solid right middle lobe lesion is mildly
hypermetabolic with an SUV max of 2.6. Better seen on recent
diagnostic CT is extension across the minor fissure, into the upper
lobe. This lesion measures approximately 2.2 x 1.7 cm on image 79.
The right upper lobe scarring appears stable without hypermetabolic
activity. Postsurgical changes from previous left mastectomy and
axillary node dissection are noted without abnormal metabolic
activity. There is extensive atherosclerosis of the aorta, great
vessels and coronary arteries.

ABDOMEN/PELVIS

There is no hypermetabolic activity within the liver, adrenal
glands, spleen or pancreas. There is no hypermetabolic nodal
activity. Hepatic steatosis, probable small hepatic cysts and
diffuse aortic and branch vessel atherosclerosis noted.

SKELETON

There is no hypermetabolic activity to suggest osseous metastatic
disease.
IMPRESSION: 1. The enlarging irregular nodule involving the right middle and
upper lobes is mildly hypermetabolic, supporting the likelihood of
malignancy, probably adenocarcinoma. Tissue sampling recommended.
2. No other suspicious findings.  No evidence of metastatic disease.

## 2017-12-15 IMAGING — DX DG CHEST 1V PORT
1 series · 1 of 1 positions shown · non-contrast
Comparison: 04/11/2016;

CLINICAL DATA: Right-sided lung biopsy.

EXAM:
PORTABLE CHEST 1 VIEW

[chest ap]
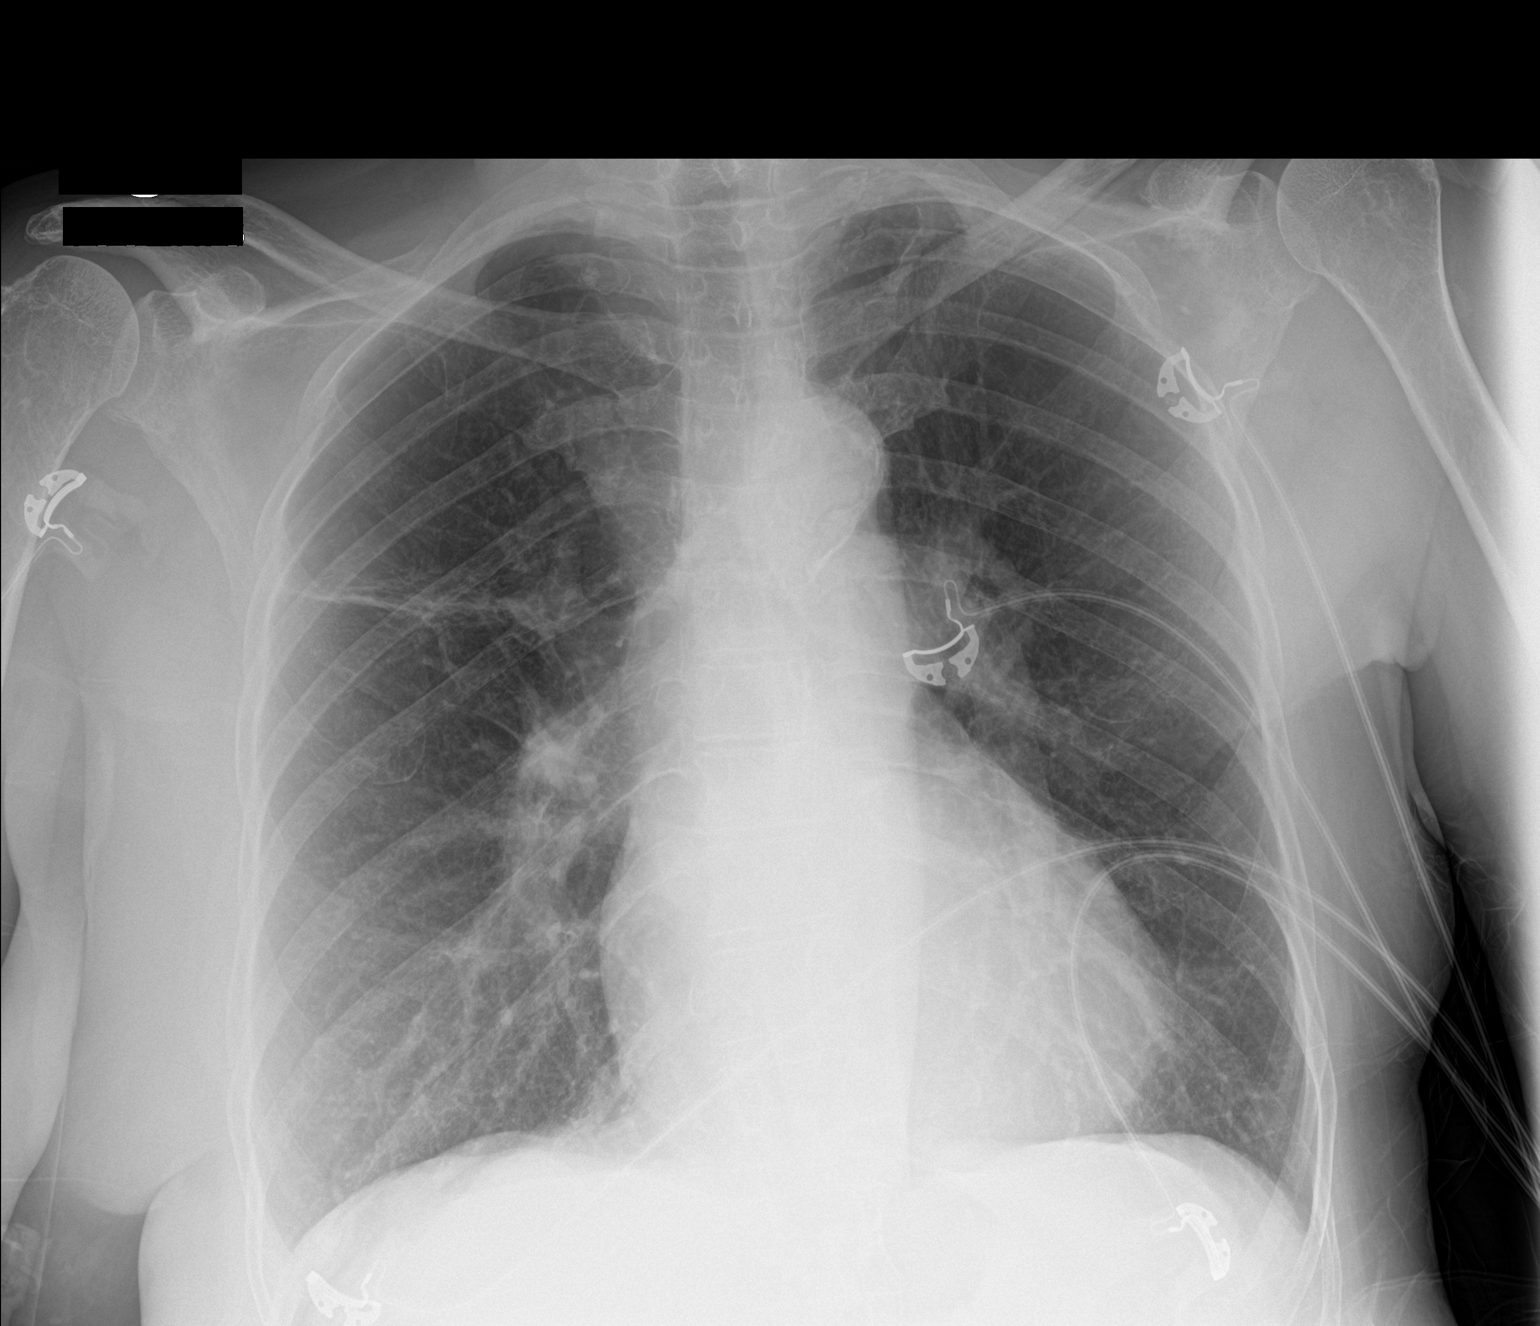

[1 of 1 positions shown; findings below may reference images not displayed]

PET-CT- 07/13/2016; CT-guided right
upper/middle lobe pulmonary nodule biopsy - earlier same date
FINDINGS: Grossly unchanged cardiac silhouette and mediastinal contours.
Atherosclerotic plaque when the thoracic aortic linear ill-defined
heterogeneous opacities within the right upper lung are unchanged.
No new focal airspace opacities. No pleural effusion or
pneumothorax. No evidence of edema. No acute osseus abnormalities.
IMPRESSION: No evidence of complication following right upper lung pulmonary
nodule biopsy. Specifically, no evidence of pneumothorax.

## 2017-12-15 IMAGING — CT CT BIOPSY
3 of 4 series · 7 of 14 positions shown, 8 images · non-contrast
Comparison: PET-CT - 07/13/2016;

INDICATION: Slowly enlarging indeterminate ground-glass nodular opacity within
the anterior aspects of the right upper and middle lobes traversing
the right minor fissure. Please perform CT-guided biopsy for tissue
diagnostic purposes

EXAM:
CT BIOPSY

[Series 2: i-spiral 5.0 b30f · axial · 0.53mm/px · z∈[+1120,+1155]mm · 2 of 31 slices shown, 3 images (1 of 2)]
[im 11/31  soft-tissue]
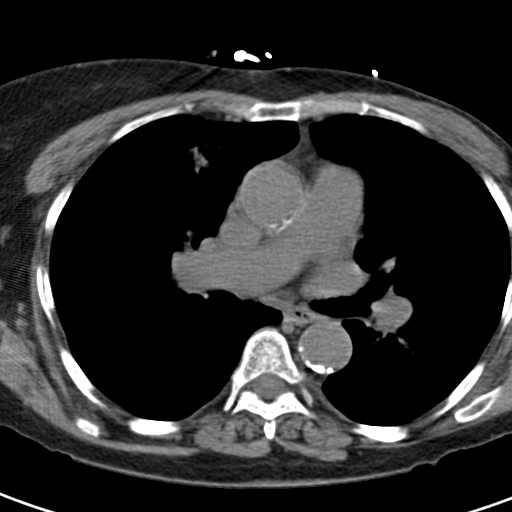
[im 11/31  bone]
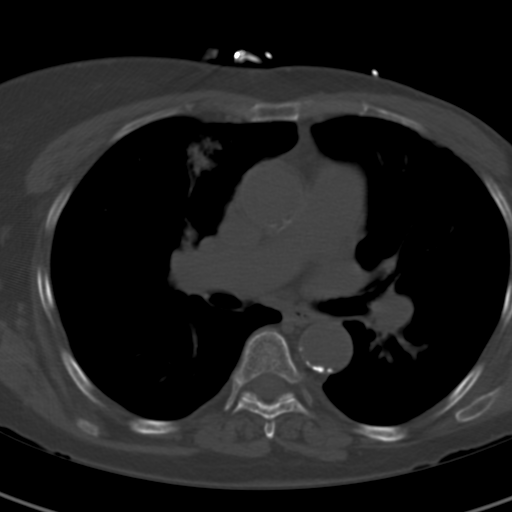
[im 21/31  bone]
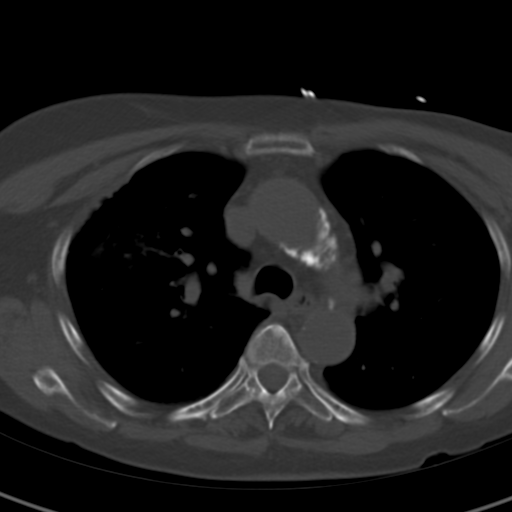

[Series 4: i-sequence 4.8 b30s · axial · 0.53mm/px · z∈[+1118,+1125]mm · 3 of 42 slices shown]
[im 11/42  bone]
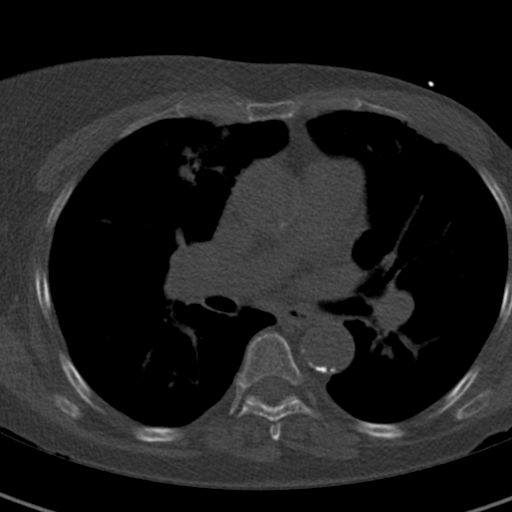
[im 21/42  bone]
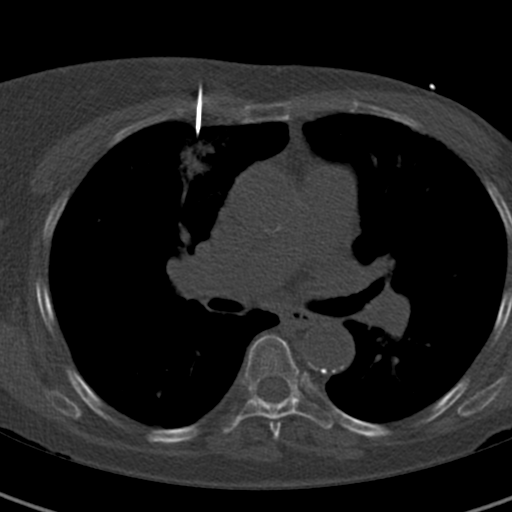
[im 31/42  bone]
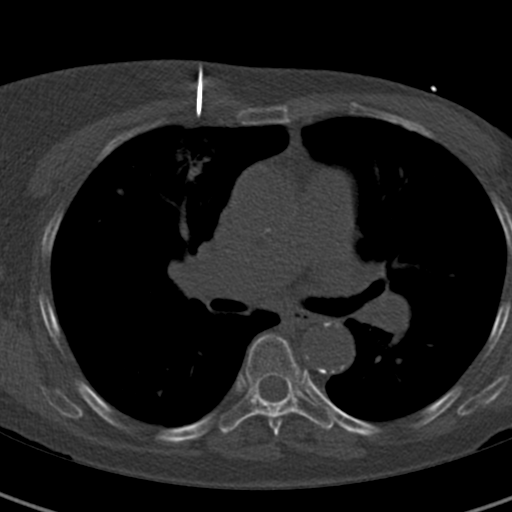

[Series 5: i-spiral 5.0 b30f · axial · 0.53mm/px · z∈[+1094,+1128]mm · 2 of 31 slices shown (2 of 2)]
[im 11/31  bone]
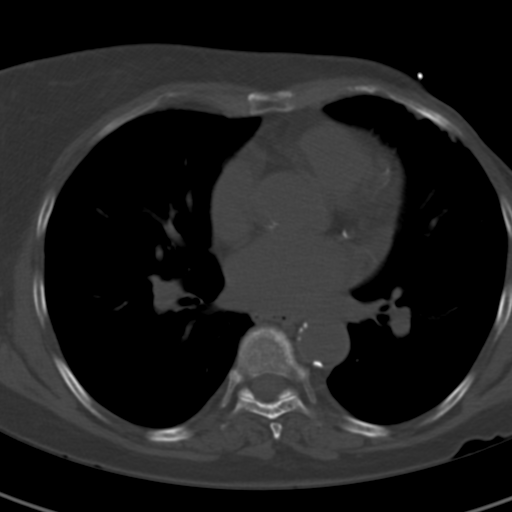
[im 21/31  bone]
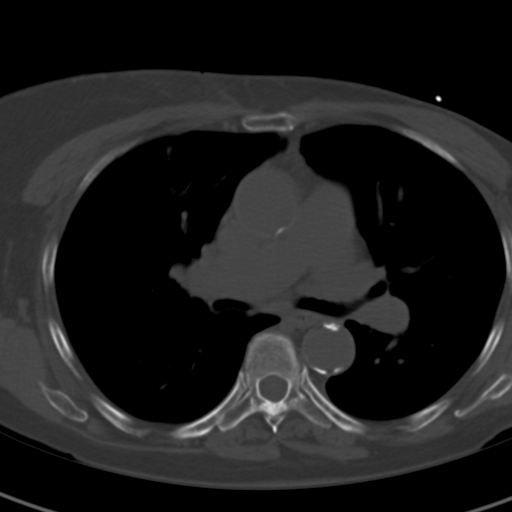

[7 of 14 positions shown; findings below may reference images not displayed]

chest CT - 02/28/2016; 11/10/2015

MEDICATIONS:
None.

ANESTHESIA/SEDATION:
Fentanyl 0.5 mcg IV; Versed 25 mg IV

Sedation time: 23 minutes; The patient was continuously monitored
during the procedure by the interventional radiology nurse under my
direct supervision.

CONTRAST:  None

COMPLICATIONS:
None immediate.

PROCEDURE:
Informed consent was obtained from the patient following an
explanation of the procedure, risks, benefits and alternatives. The
patient understands,agrees and consents for the procedure. All
questions were addressed. A time out was performed prior to the
initiation of the procedure.

The patient was positioned supine on the CT table and a limited
chest CT was performed for procedural planning demonstrating
unchanged size and appearance of ill-defined clustered nodular
opacities within the anterior segment of the confluence of the right
upper and middle lobes (representative image 5, series 3). The
operative site was prepped and draped in the usual sterile fashion.
Under sterile conditions and local anesthesia, a 17 gauge coaxial
needle was advanced into the peripheral aspect of the nodule.
Positioning was confirmed with intermittent CT fluoroscopy and
followed by the acquisition of 3 core needle biopsies with an 18
gauge core needle biopsy device. The coaxial needle was removed
following deployment of a [employer] plug and superficial hemostasis
was achieved with manual compression.

Limited post procedural chest CT was negative for pneumothorax or
additional complication. A dressing was placed. The patient
tolerated the procedure well without immediate postprocedural
complication. The patient was escorted to have an upright chest
radiograph.
IMPRESSION: Technically successful CT guided core needle core biopsy of
clustered nodular opacities within the anterior segments of the
right upper and middle lobes.

## 2017-12-19 ENCOUNTER — Inpatient Hospital Stay: Payer: BLUE CROSS/BLUE SHIELD | Attending: Internal Medicine

## 2017-12-19 ENCOUNTER — Telehealth: Payer: Self-pay | Admitting: *Deleted

## 2017-12-19 ENCOUNTER — Inpatient Hospital Stay: Payer: BLUE CROSS/BLUE SHIELD | Admitting: Internal Medicine

## 2017-12-19 NOTE — Progress Notes (Deleted)
[facility] OFFICE PROGRESS NOTE  Patient Care Team: Elisabeth Cara, NP as PCP - General (Nurse Practitioner)  Cancer Staging Breast cancer in female [facility]) Staging form: Breast, AJCC 7th Edition - Clinical: Stage IIIA (T3, N1, M0) - Signed by [name], NP on 02/21/2016    Oncology History   # 2016- JAN RUL 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 A. LUNG MASS, RIGHT; CT-GUIDED BIOPSY: - ADENOCARCINOMA, ACINAR AND PAPILLARY MORPHOLOGIES. S/p RT [finished Dec 2017] s/p RT [facility] 2017]  # 2000-  Left breast cancer [T3N1- stage III] mastecomy s/p RT; s/p chemo; Tamoxifen  # NOV 111 2017- Mol testing [RML- adeno ca]         Breast cancer in female ([place])   07/24/1999 Initial Diagnosis    Breast cancer in female ([place]) T3 N1 M0 tumor ER/PR positive      08/24/1999 -  Anti-estrogen oral therapy    Started tamoxifen      08/24/1999 Surgery    Status post modified radical mastectomy of left breast       Chemotherapy          Radiation Therapy         11/24/2003 - 02/20/2009 Anti-estrogen oral therapy    Started Aromasin       Carcinoma of overlapping sites of left breast in female, estrogen receptor positive ([place])   06/22/2016 Initial Diagnosis    Cancer of overlapping sites of left female breast [facility])       Primary cancer of right upper lobe of lung ([place])     INTERVAL HISTORY:  Danielle [name] 62 y.o.  female pleasant patient above history ofBreast cancer; Stage I squamous cell lung cancer; and also history of right middle lobe lung Adenocarcinoma [biopsy September 2017] status post radiation finished December 2017 is here for follow-up/ To review the results of the CT scan.  Patient denies any recent admission to hospital.  Denies any worsening shortness of breath.  She has chronic mild shortness of breath not on home  O2.  Positive for mild weight loss.  No hemoptysis. No chest pain. No headaches. Chronic mild tingling and numbness in the extremity. Not any worse.  REVIEW OF SYSTEMS:  A complete 10 point review of system is done which is negative except mentioned above/history of present illness.   PAST MEDICAL HISTORY :  Past Medical History:  Diagnosis Date  . Anxiety   . Arthritis   . Cancer ([place]) left    breast cancer 2000, chemo tx's with total mastectomy and lymph nodes resected.   . Cancer of right lung ([name]) 02/21/2016   rad tx's.   [name] COPD (chronic obstructive pulmonary disease) ([place])   . Dependence on supplemental oxygen   . Depression   . Heart murmur   . Hypertension   . Lung nodule   . Lymphedema   . Shortness of breath dyspnea    with exertion  . Status post chemotherapy 2001   left breast cancer  . Status post radiation therapy 2001   left breast cancer    PAST SURGICAL HISTORY :   Past Surgical History:  Procedure Laterality Date  . Breast Biospy Left    [facility]  . BREAST SURGERY    . DILATION AND CURETTAGE OF UTERUS    . ELECTROMAGNETIC NAVIGATION BROCHOSCOPY Right  04/11/2016   Procedure: ELECTROMAGNETIC NAVIGATION BRONCHOSCOPY;  Surgeon: Vilinda Boehringer, MD;  Location: [facility] ORS;  Service: Cardiopulmonary;  Laterality: Right;  . history of colonoscopy]    . LUNG BIOPSY    . MASTECTOMY Left    2000, [facility]  . ROTATOR CUFF REPAIR Right    [facility]    FAMILY HISTORY :   Family History  Problem Relation Age of Onset  . Breast cancer Mother 60  . Cancer Mother        Breast   . Cirrhosis Father   . Breast cancer Paternal Aunt 14  . Cancer Maternal Aunt        Breast     SOCIAL HISTORY:   Social History   Tobacco Use  . Smoking status: Former Smoker    Packs/day: 0.50    Years: 20.00    Pack years: 10.00    Types: Cigarettes    Last attempt to quit: 07/02/2012    Years since quitting: 5.4  . Smokeless tobacco: Never Used  . Tobacco comment: quit 2014  Substance Use  Topics  . Alcohol use: Yes    Comment: Occasionally  . Drug use: No    ALLERGIES:  has No Known Allergies.  MEDICATIONS:  Current Outpatient Medications  Medication Sig Dispense Refill  . albuterol (PROAIR HFA) 108 (90 BASE) MCG/ACT inhaler Inhale 1-2 puffs into the lungs every 6 (six) hours as needed for wheezing or shortness of breath.     [name] amLODipine (NORVASC) 10 MG tablet Take 10 mg by mouth daily. for blood pressure  3  . budesonide-formoterol (SYMBICORT) 160-4.5 MCG/ACT inhaler Inhale 2 puffs into the lungs 2 (two) times daily.    . celecoxib (CELEBREX) 200 MG capsule Take 1 capsule (200 mg total) by mouth daily. 30 capsule 0  . citalopram (CELEXA) 20 MG tablet Take 20 mg by mouth daily.     [name] HYDROcodone-acetaminophen (NORCO/VICODIN) 5-325 MG tablet Take 1 tablet by mouth every 4 (four) hours as needed for moderate pain. (Patient not taking: Reported on 09/19/2017) 30 tablet 0  . losartan (COZAAR) 100 MG tablet Take 1 tablet (100 mg total) by mouth daily. 30 tablet 0  . meloxicam (MOBIC) 7.5 MG tablet Take 7.5 mg by mouth daily.     . potassium chloride SA (K-DUR,KLOR-CON) 20 MEQ tablet Take 1 tablet (20 mEq total) by mouth daily. 30 tablet 6  . tiotropium (SPIRIVA) 18 MCG inhalation capsule Place into inhaler and inhale.     No current facility-administered medications for this visit.     PHYSICAL EXAMINATION: ECOG PERFORMANCE STATUS: 0 - Asymptomatic  There were no vitals taken for this visit.  There were no vitals filed for this visit.  GENERAL: Well-nourished well-developed; Alert, no distress and comfortable.   Alone.  EYES: no pallor or icterus OROPHARYNX: no thrush or ulceration; good dentition  NECK: supple, no masses felt LYMPH:  no palpable lymphadenopathy in the cervical, axillary or inguinal regions LUNGS: clear to auscultation and  No wheeze or crackles HEART/CVS: regular rate & rhythm and no murmurs; No lower extremity edema ABDOMEN:abdomen soft, non-tender  and normal bowel sounds Musculoskeletal:no cyanosis of digits and no clubbing  PSYCH: alert & oriented x 3 with fluent speech NEURO: no focal motor/sensory deficits SKIN:  no rashes or significant lesions  LABORATORY DATA:  I have reviewed the data as listed    Component Value Date/Time   NA 134 (L) 09/19/2017 0846   NA 132 (L) 02/09/2015 1100  K 4.4 09/19/2017 0846   K 3.8 02/09/2015 1100   CL 101 09/19/2017 0846   CL 95 (L) 02/09/2015 1100   CO2 24 09/19/2017 0846   CO2 29 02/09/2015 1100   GLUCOSE 95 09/19/2017 0846   GLUCOSE 105 (H) 02/09/2015 1100   BUN 12 09/19/2017 0846   BUN 16 02/09/2015 1100   CREATININE 0.69 09/19/2017 0846   CREATININE 0.81 02/09/2015 1100   CALCIUM 8.9 09/19/2017 0846   CALCIUM 9.1 02/09/2015 1100   PROT 7.8 09/19/2017 0846   PROT 7.7 02/09/2015 1100   ALBUMIN 3.9 09/19/2017 0846   ALBUMIN 4.3 02/09/2015 1100   AST 75 (H) 09/19/2017 0846   AST 29 02/09/2015 1100   ALT 47 09/19/2017 0846   ALT 20 02/09/2015 1100   ALKPHOS 119 09/19/2017 0846   ALKPHOS 69 02/09/2015 1100   BILITOT 0.8 09/19/2017 0846   BILITOT 0.9 02/09/2015 1100   GFRNONAA >60 09/19/2017 0846   GFRNONAA >60 02/09/2015 1100   GFRAA >60 09/19/2017 0846   GFRAA >60 02/09/2015 1100    No results found for: SPEP, UPEP  Lab Results  Component Value Date   WBC 6.1 09/19/2017   NEUTROABS 3.3 09/19/2017   HGB 10.9 (L) 09/19/2017   HCT 33.3 (L) 09/19/2017   MCV 97.5 09/19/2017   PLT 348 09/19/2017      Chemistry      Component Value Date/Time   NA 134 (L) 09/19/2017 0846   NA 132 (L) 02/09/2015 1100   K 4.4 09/19/2017 0846   K 3.8 02/09/2015 1100   CL 101 09/19/2017 0846   CL 95 (L) 02/09/2015 1100   CO2 24 09/19/2017 0846   CO2 29 02/09/2015 1100   BUN 12 09/19/2017 0846   BUN 16 02/09/2015 1100   CREATININE 0.69 09/19/2017 0846   CREATININE 0.81 02/09/2015 1100      Component Value Date/Time   CALCIUM 8.9 09/19/2017 0846   CALCIUM 9.1 02/09/2015 1100    ALKPHOS 119 09/19/2017 0846   ALKPHOS 69 02/09/2015 1100   AST 75 (H) 09/19/2017 0846   AST 29 02/09/2015 1100   ALT 47 09/19/2017 0846   ALT 20 02/09/2015 1100   BILITOT 0.8 09/19/2017 0846   BILITOT 0.9 02/09/2015 1100    IMPRESSION:  IMPRESSION: 1. Today's study demonstrates a positive response to therapy. All of the previously noted lung lesions appear smaller, including the somewhat ill-defined right upper lobe sub solid mass, as detailed above. 2. Diffuse bronchial wall thickening with moderate centrilobular and paraseptal emphysema; imaging findings suggestive of underlying COPD. 3. Aortic atherosclerosis, in addition to left main and 3 vessel coronary artery disease. Please note that although the presence of coronary artery calcium documents the presence of coronary artery disease, the severity of this disease and any potential stenosis cannot be assessed on this non-gated CT examination. Assessment for potential risk factor modification, dietary therapy or pharmacologic therapy may be warranted, if clinically indicated. 4. There are calcifications of the aortic valve and mitral annulus. Echocardiographic correlation for evaluation of potential valvular dysfunction may be warranted if clinically indicated.   Electronically Signed   By: Vinnie Langton M.D.   On: 03/01/2017 15:42   RADIOGRAPHIC STUDIES: I have personally reviewed the radiological images as listed and agreed with the findings in the report. No results found.   ASSESSMENT & PLAN:  No problem-specific Assessment & Plan notes found for this encounter.  No orders of the defined types were placed in this encounter.  All questions were answered. The patient knows to call the clinic with any problems, questions or concerns.      [name], MD 12/19/2017 7:32 AM

## 2017-12-19 NOTE — Telephone Encounter (Signed)
Pt no show for her apt with Dr. B today. Also has not had a recent ct scan, which Dr. Baruch Gouty ordered.  Colette, will r/s pt's apts and ct scan.

## 2017-12-19 NOTE — Assessment & Plan Note (Deleted)
#  Right upper lung nodule-s/p Bx- proven adenocarcinoma. S/p RT dec 2017. No evidence of any clinical progression.  # CT scan NOV 2018- Stable right upper lobe; previous right upper lobe 2 cm nodule improved [s/p RT in dec2017] ; however inferior to the previous radiation scarring -solid component noted question radiation versus recurrence.  Difficulty biopsy.  Recommend close surveillance CT scan in 3 months.  #Discussed with patient has progression on subsequent scans-she may need therapies.   # History of ER/PR positive HER-2/neu negative breast cancer stage III [2000]- clinically no evidence of recurrence.  # Anemia- hb 10.5/stable; NOT iron deficient; monitor for now.  # Follow up with a M.D.- few days prior CT scan; in  3 months with labs  # I reviewed the blood work- with the patient in detail; also reviewed the imaging independently [as summarized above]; and with the patient in detail.

## 2018-02-10 DIAGNOSIS — I350 Nonrheumatic aortic (valve) stenosis: Secondary | ICD-10-CM

## 2018-02-10 HISTORY — DX: Nonrheumatic aortic (valve) stenosis: I35.0

## 2018-04-21 ENCOUNTER — Encounter: Payer: Self-pay | Admitting: Internal Medicine

## 2018-04-21 ENCOUNTER — Inpatient Hospital Stay: Payer: Medicaid Other

## 2018-04-21 ENCOUNTER — Emergency Department: Payer: Medicaid Other

## 2018-04-21 ENCOUNTER — Inpatient Hospital Stay
Admit: 2018-04-21 | Discharge: 2018-04-21 | Disposition: A | Discharge status: Home / Self Care | Payer: Medicaid Other | Attending: Internal Medicine | Admitting: Internal Medicine

## 2018-04-21 ENCOUNTER — Inpatient Hospital Stay
Admission: EM | Admit: 2018-04-21 | Discharge: 2018-05-17 | DRG: 981 | Disposition: A | Discharge status: Home / Self Care | Payer: Medicaid Other | Attending: Internal Medicine | Admitting: Internal Medicine

## 2018-04-21 ENCOUNTER — Other Ambulatory Visit: Payer: Self-pay

## 2018-04-21 DIAGNOSIS — K633 Ulcer of intestine: Secondary | ICD-10-CM

## 2018-04-21 DIAGNOSIS — Y842 Radiological procedure and radiotherapy as the cause of abnormal reaction of the patient, or of later complication, without mention of misadventure at the time of the procedure: Secondary | ICD-10-CM | POA: Diagnosis present

## 2018-04-21 DIAGNOSIS — K5730 Diverticulosis of large intestine without perforation or abscess without bleeding: Secondary | ICD-10-CM

## 2018-04-21 DIAGNOSIS — K219 Gastro-esophageal reflux disease without esophagitis: Secondary | ICD-10-CM | POA: Diagnosis present

## 2018-04-21 DIAGNOSIS — K9281 Gastrointestinal mucositis (ulcerative): Secondary | ICD-10-CM | POA: Diagnosis present

## 2018-04-21 DIAGNOSIS — R609 Edema, unspecified: Secondary | ICD-10-CM

## 2018-04-21 DIAGNOSIS — I4891 Unspecified atrial fibrillation: Secondary | ICD-10-CM | POA: Diagnosis present

## 2018-04-21 DIAGNOSIS — D62 Acute posthemorrhagic anemia: Secondary | ICD-10-CM | POA: Diagnosis present

## 2018-04-21 DIAGNOSIS — K567 Ileus, unspecified: Secondary | ICD-10-CM | POA: Diagnosis not present

## 2018-04-21 DIAGNOSIS — I5023 Acute on chronic systolic (congestive) heart failure: Secondary | ICD-10-CM | POA: Diagnosis present

## 2018-04-21 DIAGNOSIS — K760 Fatty (change of) liver, not elsewhere classified: Secondary | ICD-10-CM | POA: Diagnosis present

## 2018-04-21 DIAGNOSIS — N19 Unspecified kidney failure: Secondary | ICD-10-CM

## 2018-04-21 DIAGNOSIS — G9349 Other encephalopathy: Secondary | ICD-10-CM | POA: Diagnosis not present

## 2018-04-21 DIAGNOSIS — J9690 Respiratory failure, unspecified, unspecified whether with hypoxia or hypercapnia: Secondary | ICD-10-CM

## 2018-04-21 DIAGNOSIS — I110 Hypertensive heart disease with heart failure: Secondary | ICD-10-CM | POA: Diagnosis present

## 2018-04-21 DIAGNOSIS — I5021 Acute systolic (congestive) heart failure: Secondary | ICD-10-CM | POA: Diagnosis present

## 2018-04-21 DIAGNOSIS — F0390 Unspecified dementia without behavioral disturbance: Secondary | ICD-10-CM | POA: Diagnosis present

## 2018-04-21 DIAGNOSIS — N179 Acute kidney failure, unspecified: Secondary | ICD-10-CM

## 2018-04-21 DIAGNOSIS — E876 Hypokalemia: Secondary | ICD-10-CM | POA: Diagnosis present

## 2018-04-21 DIAGNOSIS — K559 Vascular disorder of intestine, unspecified: Secondary | ICD-10-CM | POA: Diagnosis present

## 2018-04-21 DIAGNOSIS — N170 Acute kidney failure with tubular necrosis: Secondary | ICD-10-CM | POA: Diagnosis present

## 2018-04-21 DIAGNOSIS — I420 Dilated cardiomyopathy: Secondary | ICD-10-CM | POA: Diagnosis present

## 2018-04-21 DIAGNOSIS — Z7901 Long term (current) use of anticoagulants: Secondary | ICD-10-CM

## 2018-04-21 DIAGNOSIS — R578 Other shock: Secondary | ICD-10-CM | POA: Diagnosis not present

## 2018-04-21 DIAGNOSIS — Z515 Encounter for palliative care: Secondary | ICD-10-CM

## 2018-04-21 DIAGNOSIS — F329 Major depressive disorder, single episode, unspecified: Secondary | ICD-10-CM | POA: Diagnosis present

## 2018-04-21 DIAGNOSIS — E782 Mixed hyperlipidemia: Secondary | ICD-10-CM | POA: Diagnosis present

## 2018-04-21 DIAGNOSIS — Z9221 Personal history of antineoplastic chemotherapy: Secondary | ICD-10-CM

## 2018-04-21 DIAGNOSIS — R17 Unspecified jaundice: Secondary | ICD-10-CM

## 2018-04-21 DIAGNOSIS — R571 Hypovolemic shock: Secondary | ICD-10-CM | POA: Diagnosis present

## 2018-04-21 DIAGNOSIS — K9189 Other postprocedural complications and disorders of digestive system: Secondary | ICD-10-CM

## 2018-04-21 DIAGNOSIS — Z85118 Personal history of other malignant neoplasm of bronchus and lung: Secondary | ICD-10-CM

## 2018-04-21 DIAGNOSIS — J9622 Acute and chronic respiratory failure with hypercapnia: Secondary | ICD-10-CM | POA: Diagnosis present

## 2018-04-21 DIAGNOSIS — J441 Chronic obstructive pulmonary disease with (acute) exacerbation: Secondary | ICD-10-CM | POA: Diagnosis present

## 2018-04-21 DIAGNOSIS — E440 Moderate protein-calorie malnutrition: Secondary | ICD-10-CM

## 2018-04-21 DIAGNOSIS — K55031 Focal (segmental) acute (reversible) ischemia of large intestine: Secondary | ICD-10-CM

## 2018-04-21 DIAGNOSIS — Z923 Personal history of irradiation: Secondary | ICD-10-CM

## 2018-04-21 DIAGNOSIS — E11649 Type 2 diabetes mellitus with hypoglycemia without coma: Secondary | ICD-10-CM | POA: Diagnosis present

## 2018-04-21 DIAGNOSIS — R1011 Right upper quadrant pain: Secondary | ICD-10-CM

## 2018-04-21 DIAGNOSIS — K639 Disease of intestine, unspecified: Secondary | ICD-10-CM

## 2018-04-21 DIAGNOSIS — F419 Anxiety disorder, unspecified: Secondary | ICD-10-CM | POA: Diagnosis present

## 2018-04-21 DIAGNOSIS — I509 Heart failure, unspecified: Secondary | ICD-10-CM

## 2018-04-21 DIAGNOSIS — Z789 Other specified health status: Secondary | ICD-10-CM

## 2018-04-21 DIAGNOSIS — Z9012 Acquired absence of left breast and nipple: Secondary | ICD-10-CM

## 2018-04-21 DIAGNOSIS — R933 Abnormal findings on diagnostic imaging of other parts of digestive tract: Secondary | ICD-10-CM

## 2018-04-21 DIAGNOSIS — K529 Noninfective gastroenteritis and colitis, unspecified: Secondary | ICD-10-CM

## 2018-04-21 DIAGNOSIS — E871 Hypo-osmolality and hyponatremia: Secondary | ICD-10-CM | POA: Diagnosis not present

## 2018-04-21 DIAGNOSIS — C3411 Malignant neoplasm of upper lobe, right bronchus or lung: Secondary | ICD-10-CM

## 2018-04-21 DIAGNOSIS — J9621 Acute and chronic respiratory failure with hypoxia: Secondary | ICD-10-CM | POA: Diagnosis present

## 2018-04-21 DIAGNOSIS — Z6825 Body mass index (BMI) 25.0-25.9, adult: Secondary | ICD-10-CM

## 2018-04-21 DIAGNOSIS — Z7189 Other specified counseling: Secondary | ICD-10-CM

## 2018-04-21 DIAGNOSIS — Z79899 Other long term (current) drug therapy: Secondary | ICD-10-CM

## 2018-04-21 DIAGNOSIS — Z803 Family history of malignant neoplasm of breast: Secondary | ICD-10-CM

## 2018-04-21 DIAGNOSIS — E875 Hyperkalemia: Secondary | ICD-10-CM | POA: Diagnosis not present

## 2018-04-21 DIAGNOSIS — Z9981 Dependence on supplemental oxygen: Secondary | ICD-10-CM

## 2018-04-21 DIAGNOSIS — J9601 Acute respiratory failure with hypoxia: Secondary | ICD-10-CM | POA: Diagnosis present

## 2018-04-21 DIAGNOSIS — Z4659 Encounter for fitting and adjustment of other gastrointestinal appliance and device: Secondary | ICD-10-CM

## 2018-04-21 DIAGNOSIS — R1084 Generalized abdominal pain: Secondary | ICD-10-CM

## 2018-04-21 DIAGNOSIS — R0600 Dyspnea, unspecified: Secondary | ICD-10-CM

## 2018-04-21 DIAGNOSIS — J700 Acute pulmonary manifestations due to radiation: Secondary | ICD-10-CM | POA: Diagnosis present

## 2018-04-21 DIAGNOSIS — Z87891 Personal history of nicotine dependence: Secondary | ICD-10-CM

## 2018-04-21 DIAGNOSIS — Z853 Personal history of malignant neoplasm of breast: Secondary | ICD-10-CM

## 2018-04-21 DIAGNOSIS — Z0189 Encounter for other specified special examinations: Secondary | ICD-10-CM

## 2018-04-21 DIAGNOSIS — Z7951 Long term (current) use of inhaled steroids: Secondary | ICD-10-CM

## 2018-04-21 DIAGNOSIS — R531 Weakness: Secondary | ICD-10-CM

## 2018-04-21 DIAGNOSIS — M1990 Unspecified osteoarthritis, unspecified site: Secondary | ICD-10-CM | POA: Diagnosis present

## 2018-04-21 DIAGNOSIS — K921 Melena: Secondary | ICD-10-CM

## 2018-04-21 HISTORY — DX: Heart failure, unspecified: I50.9

## 2018-04-21 LAB — GLUCOSE, CAPILLARY
GLUCOSE-CAPILLARY: 146 mg/dL — AB (ref 70–99)
GLUCOSE-CAPILLARY: 248 mg/dL — AB (ref 70–99)
Glucose-Capillary: 75 mg/dL (ref 70–99)
Glucose-Capillary: 263 mg/dL — ABNORMAL HIGH (ref 70–99)
Glucose-Capillary: 258 mg/dL — ABNORMAL HIGH (ref 70–99)

## 2018-04-21 LAB — COMPREHENSIVE METABOLIC PANEL
ALT: 100 U/L — ABNORMAL HIGH (ref 0–44)
AST: 182 U/L — ABNORMAL HIGH (ref 15–41)
Albumin: 3.4 g/dL — ABNORMAL LOW (ref 3.5–5.0)
Alkaline Phosphatase: 102 U/L (ref 38–126)
Anion gap: 21 — ABNORMAL HIGH (ref 5–15)
BUN: 16 mg/dL (ref 8–23)
CHLORIDE: 106 mmol/L (ref 98–111)
CO2: 15 mmol/L — ABNORMAL LOW (ref 22–32)
CREATININE: 1.51 mg/dL — AB (ref 0.44–1.00)
Calcium: 6.9 mg/dL — ABNORMAL LOW (ref 8.9–10.3)
GFR calc Af Amer: 41 mL/min — ABNORMAL LOW (ref >60)
GFR, EST NON AFRICAN AMERICAN: 35 mL/min — AB (ref >60)
Glucose, Bld: 61 mg/dL — ABNORMAL LOW (ref 70–99)
POTASSIUM: 2.5 mmol/L — AB (ref 3.5–5.1)
SODIUM: 142 mmol/L (ref 135–145)
Total Bilirubin: 4.2 mg/dL — ABNORMAL HIGH (ref 0.3–1.2)
Total Protein: 7.5 g/dL (ref 6.5–8.1)

## 2018-04-21 LAB — CBC WITH DIFFERENTIAL/PLATELET
BASOS ABS: 0.1 10*3/uL (ref 0–0.1)
Basophils Relative: 1 %
EOS ABS: 0 10*3/uL (ref 0–0.7)
Eosinophils Relative: 0 %
HCT: 27.2 % — ABNORMAL LOW (ref 35.0–47.0)
HEMOGLOBIN: 9.3 g/dL — AB (ref 12.0–16.0)
LYMPHS PCT: 30 %
Lymphs Abs: 2.4 10*3/uL (ref 1.0–3.6)
MCH: 35.2 pg — ABNORMAL HIGH (ref 26.0–34.0)
MCHC: 34.1 g/dL (ref 32.0–36.0)
MCV: 103.3 fL — ABNORMAL HIGH (ref 80.0–100.0)
Monocytes Absolute: 0.7 10*3/uL (ref 0.2–0.9)
Monocytes Relative: 9 %
NEUTROS ABS: 4.7 10*3/uL (ref 1.4–6.5)
NEUTROS PCT: 60 %
Platelets: 229 10*3/uL (ref 150–440)
RBC: 2.64 MIL/uL — ABNORMAL LOW (ref 3.80–5.20)
RDW: 15.4 % — ABNORMAL HIGH (ref 11.5–14.5)
WBC: 7.9 10*3/uL (ref 3.6–11.0)

## 2018-04-21 LAB — PREALBUMIN: Prealbumin: 13 mg/dL — ABNORMAL LOW (ref 18–38)

## 2018-04-21 LAB — TROPONIN I
Troponin I: 0.04 ng/mL (ref <0.03)
Troponin I: 0.04 ng/mL (ref <0.03)
Troponin I: 0.04 ng/mL (ref <0.03)

## 2018-04-21 LAB — VITAMIN B12: Vitamin B-12: 2668 pg/mL — ABNORMAL HIGH (ref 180–914)

## 2018-04-21 LAB — ECHOCARDIOGRAM COMPLETE
Height: 66 in
Weight: 2123.47 oz

## 2018-04-21 LAB — MAGNESIUM: Magnesium: 1.9 mg/dL (ref 1.7–2.4)

## 2018-04-21 LAB — PHOSPHORUS: Phosphorus: 4.7 mg/dL — ABNORMAL HIGH (ref 2.5–4.6)

## 2018-04-21 LAB — FOLATE: FOLATE: 9.4 ng/mL (ref >5.9)

## 2018-04-21 LAB — POTASSIUM: POTASSIUM: 3 mmol/L — AB (ref 3.5–5.1)

## 2018-04-21 LAB — BRAIN NATRIURETIC PEPTIDE: B NATRIURETIC PEPTIDE 5: 2666 pg/mL — AB (ref 0.0–100.0)

## 2018-04-21 LAB — MRSA PCR SCREENING: MRSA by PCR: NEGATIVE

## 2018-04-21 MED ORDER — ONDANSETRON HCL 4 MG/2ML IJ SOLN
4.0000 mg | Freq: Four times a day (QID) | INTRAMUSCULAR | Status: DC | PRN
  Administered 2018-04-22 – 2018-05-10 (×4): 4 mg via INTRAVENOUS
  Filled 2018-04-21 (×4): qty 2

## 2018-04-21 MED ORDER — AZITHROMYCIN 250 MG PO TABS
500.0000 mg | ORAL_TABLET | Freq: Every day | ORAL | Status: DC
  Administered 2018-04-21 – 2018-04-23 (×3): 500 mg via ORAL
  Filled 2018-04-21: qty 1
  Filled 2018-04-21: qty 2
  Filled 2018-04-21: qty 1

## 2018-04-21 MED ORDER — POTASSIUM CHLORIDE CRYS ER 20 MEQ PO TBCR
40.0000 meq | EXTENDED_RELEASE_TABLET | Freq: Once | ORAL | Status: AC
  Administered 2018-04-21: 40 meq via ORAL
  Filled 2018-04-21: qty 2

## 2018-04-21 MED ORDER — SODIUM CHLORIDE 0.9 % IV BOLUS
500.0000 mL | Freq: Once | INTRAVENOUS | Status: AC
  Administered 2018-04-21: 500 mL via INTRAVENOUS

## 2018-04-21 MED ORDER — FUROSEMIDE 10 MG/ML IJ SOLN
40.0000 mg | Freq: Once | INTRAMUSCULAR | Status: AC
  Administered 2018-04-21: 40 mg via INTRAVENOUS
  Filled 2018-04-21: qty 4

## 2018-04-21 MED ORDER — POTASSIUM CHLORIDE 10 MEQ/100ML IV SOLN: 10.0000 meq | Freq: Once | INTRAVENOUS | Status: DC

## 2018-04-21 MED ORDER — IPRATROPIUM-ALBUTEROL 0.5-2.5 (3) MG/3ML IN SOLN
3.0000 mL | RESPIRATORY_TRACT | Status: DC
  Administered 2018-04-21 – 2018-04-22 (×7): 3 mL via RESPIRATORY_TRACT
  Filled 2018-04-21 (×8): qty 3

## 2018-04-21 MED ORDER — DILTIAZEM HCL 25 MG/5ML IV SOLN
10.0000 mg | Freq: Once | INTRAVENOUS | Status: AC
  Administered 2018-04-21: 10 mg via INTRAVENOUS
  Filled 2018-04-21: qty 5

## 2018-04-21 MED ORDER — MOMETASONE FURO-FORMOTEROL FUM 200-5 MCG/ACT IN AERO
2.0000 | INHALATION_SPRAY | Freq: Two times a day (BID) | RESPIRATORY_TRACT | Status: DC
  Administered 2018-04-23 – 2018-05-04 (×22): 2 via RESPIRATORY_TRACT
  Filled 2018-04-21: qty 8.8

## 2018-04-21 MED ORDER — MOMETASONE FURO-FORMOTEROL FUM 200-5 MCG/ACT IN AERO
2.0000 | INHALATION_SPRAY | Freq: Two times a day (BID) | RESPIRATORY_TRACT | Status: DC
  Administered 2018-04-21: 2 via RESPIRATORY_TRACT
  Filled 2018-04-21: qty 8.8

## 2018-04-21 MED ORDER — DILTIAZEM HCL-DEXTROSE 100-5 MG/100ML-% IV SOLN (PREMIX)
5.0000 mg/h | INTRAVENOUS | Status: DC
  Administered 2018-04-21: 5 mg/h via INTRAVENOUS
  Administered 2018-04-21: 7.5 mg/h via INTRAVENOUS
  Administered 2018-04-22: 5 mg/h via INTRAVENOUS
  Filled 2018-04-21 (×3): qty 100

## 2018-04-21 MED ORDER — POTASSIUM CHLORIDE 10 MEQ/100ML IV SOLN
10.0000 meq | INTRAVENOUS | Status: DC
  Administered 2018-04-21 (×2): 10 meq via INTRAVENOUS
  Filled 2018-04-21 (×4): qty 100

## 2018-04-21 MED ORDER — BISACODYL 5 MG PO TBEC
5.0000 mg | DELAYED_RELEASE_TABLET | Freq: Every day | ORAL | Status: DC | PRN
  Filled 2018-04-21: qty 1

## 2018-04-21 MED ORDER — METHYLPREDNISOLONE SODIUM SUCC 125 MG IJ SOLR: 60.0000 mg | Freq: Two times a day (BID) | INTRAMUSCULAR | Status: DC

## 2018-04-21 MED ORDER — ALBUTEROL SULFATE (2.5 MG/3ML) 0.083% IN NEBU
INHALATION_SOLUTION | RESPIRATORY_TRACT | Status: AC
  Filled 2018-04-21: qty 3

## 2018-04-21 MED ORDER — POTASSIUM CHLORIDE 2 MEQ/ML IV SOLN
INTRAVENOUS | Status: DC
  Filled 2018-04-21: qty 1000

## 2018-04-21 MED ORDER — HEPARIN SODIUM (PORCINE) 5000 UNIT/ML IJ SOLN
5000.0000 [IU] | Freq: Three times a day (TID) | INTRAMUSCULAR | Status: DC
  Administered 2018-04-21 – 2018-04-22 (×4): 5000 [IU] via SUBCUTANEOUS
  Filled 2018-04-21 (×4): qty 1

## 2018-04-21 MED ORDER — ACETAMINOPHEN 650 MG RE SUPP: 650.0000 mg | Freq: Four times a day (QID) | RECTAL | Status: DC | PRN

## 2018-04-21 MED ORDER — METHYLPREDNISOLONE SODIUM SUCC 125 MG IJ SOLR
60.0000 mg | Freq: Two times a day (BID) | INTRAMUSCULAR | Status: AC
  Administered 2018-04-21 (×2): 60 mg via INTRAVENOUS
  Filled 2018-04-21 (×2): qty 2

## 2018-04-21 MED ORDER — MAGNESIUM SULFATE 2 GM/50ML IV SOLN
2.0000 g | Freq: Once | INTRAVENOUS | Status: AC
  Administered 2018-04-21: 2 g via INTRAVENOUS
  Filled 2018-04-21: qty 50

## 2018-04-21 MED ORDER — SENNOSIDES-DOCUSATE SODIUM 8.6-50 MG PO TABS
1.0000 | ORAL_TABLET | Freq: Every evening | ORAL | Status: DC | PRN
  Administered 2018-04-23: 1 via ORAL
  Filled 2018-04-21: qty 1

## 2018-04-21 MED ORDER — POTASSIUM CHLORIDE CRYS ER 20 MEQ PO TBCR
40.0000 meq | EXTENDED_RELEASE_TABLET | Freq: Once | ORAL | Status: AC
  Administered 2018-04-21: 40 meq via ORAL

## 2018-04-21 MED ORDER — AZITHROMYCIN 500 MG PO TABS
500.0000 mg | ORAL_TABLET | Freq: Once | ORAL | Status: AC
  Administered 2018-04-21: 500 mg via ORAL
  Filled 2018-04-21: qty 1

## 2018-04-21 MED ORDER — DILTIAZEM HCL 100 MG IV SOLR
10.0000 mg | Freq: Once | INTRAVENOUS | Status: DC
  Filled 2018-04-21 (×3): qty 10

## 2018-04-21 MED ORDER — ACETAMINOPHEN 325 MG PO TABS
650.0000 mg | ORAL_TABLET | Freq: Four times a day (QID) | ORAL | Status: DC | PRN
  Administered 2018-04-22 – 2018-05-04 (×3): 650 mg via ORAL
  Filled 2018-04-21 (×3): qty 2

## 2018-04-21 MED ORDER — DEXTROSE 50 % IV SOLN
25.0000 g | INTRAVENOUS | Status: DC | PRN
  Administered 2018-04-27: 25 g via INTRAVENOUS
  Filled 2018-04-21 (×2): qty 50

## 2018-04-21 MED ORDER — POTASSIUM CHLORIDE 10 MEQ/100ML IV SOLN: 10.0000 meq | INTRAVENOUS | Status: DC

## 2018-04-21 MED ORDER — SODIUM CHLORIDE 0.9 % IV SOLN: 500.0000 mg | INTRAVENOUS | Status: DC

## 2018-04-21 MED ORDER — DEXTROSE IN LACTATED RINGERS 5 % IV SOLN
INTRAVENOUS | Status: DC
  Administered 2018-04-21: 10:00:00 via INTRAVENOUS
  Filled 2018-04-21 (×4): qty 1000

## 2018-04-21 MED ORDER — ONDANSETRON HCL 4 MG PO TABS: 4.0000 mg | ORAL_TABLET | Freq: Four times a day (QID) | ORAL | Status: DC | PRN

## 2018-04-21 MED ORDER — POTASSIUM CHLORIDE 2 MEQ/ML IV SOLN: INTRAVENOUS | Status: DC

## 2018-04-21 MED ORDER — PREDNISONE 20 MG PO TABS: 40.0000 mg | ORAL_TABLET | Freq: Every day | ORAL | Status: DC

## 2018-04-21 MED ORDER — PREDNISONE 20 MG PO TABS
40.0000 mg | ORAL_TABLET | Freq: Every day | ORAL | Status: AC
  Administered 2018-04-22 – 2018-04-25 (×4): 40 mg via ORAL
  Filled 2018-04-21 (×2): qty 2
  Filled 2018-04-21: qty 4
  Filled 2018-04-21: qty 2

## 2018-04-21 MED ORDER — IPRATROPIUM-ALBUTEROL 0.5-2.5 (3) MG/3ML IN SOLN
3.0000 mL | Freq: Once | RESPIRATORY_TRACT | Status: AC
  Administered 2018-04-21: 3 mL via RESPIRATORY_TRACT
  Filled 2018-04-21: qty 3

## 2018-04-21 MED ORDER — SODIUM CHLORIDE 0.9 % IV SOLN
Freq: Once | INTRAVENOUS | Status: AC
  Administered 2018-04-21: 14:00:00 via INTRAVENOUS
  Filled 2018-04-21: qty 15

## 2018-04-21 MED ORDER — GI COCKTAIL ~~LOC~~
30.0000 mL | Freq: Once | ORAL | Status: AC
  Administered 2018-04-21: 30 mL via ORAL
  Filled 2018-04-21: qty 30

## 2018-04-21 MED ORDER — HYDROCODONE-ACETAMINOPHEN 5-325 MG PO TABS
1.0000 | ORAL_TABLET | ORAL | Status: DC | PRN
  Administered 2018-04-23 – 2018-04-27 (×11): 1 via ORAL
  Filled 2018-04-21 (×12): qty 1

## 2018-04-21 MED ORDER — AMLODIPINE BESYLATE 10 MG PO TABS
10.0000 mg | ORAL_TABLET | Freq: Every day | ORAL | Status: DC
  Administered 2018-04-21 – 2018-04-22 (×2): 10 mg via ORAL
  Filled 2018-04-21 (×2): qty 1

## 2018-04-21 MED ORDER — CITALOPRAM HYDROBROMIDE 20 MG PO TABS
20.0000 mg | ORAL_TABLET | Freq: Every day | ORAL | Status: DC
  Administered 2018-04-21 – 2018-05-04 (×14): 20 mg via ORAL
  Filled 2018-04-21 (×15): qty 1

## 2018-04-21 MED ORDER — METHYLPREDNISOLONE SODIUM SUCC 125 MG IJ SOLR
125.0000 mg | Freq: Once | INTRAMUSCULAR | Status: AC
Start: 2018-04-21 — End: 2018-04-21
  Administered 2018-04-21: 125 mg via INTRAVENOUS
  Filled 2018-04-21: qty 2

## 2018-04-21 MED ORDER — INSULIN ASPART 100 UNIT/ML ~~LOC~~ SOLN
0.0000 [IU] | SUBCUTANEOUS | Status: DC
  Administered 2018-04-21 (×2): 8 [IU] via SUBCUTANEOUS
  Administered 2018-04-22 (×3): 3 [IU] via SUBCUTANEOUS
  Administered 2018-04-22: 5 [IU] via SUBCUTANEOUS
  Administered 2018-04-22: 2 [IU] via SUBCUTANEOUS
  Administered 2018-04-22: 3 [IU] via SUBCUTANEOUS
  Administered 2018-04-23 (×2): 2 [IU] via SUBCUTANEOUS
  Administered 2018-04-23: 3 [IU] via SUBCUTANEOUS
  Administered 2018-04-23: 5 [IU] via SUBCUTANEOUS
  Filled 2018-04-21 (×12): qty 1

## 2018-04-21 MED ORDER — ALBUTEROL SULFATE (2.5 MG/3ML) 0.083% IN NEBU
5.0000 mg | INHALATION_SOLUTION | Freq: Once | RESPIRATORY_TRACT | Status: AC
  Administered 2018-04-21: 5 mg via RESPIRATORY_TRACT
  Filled 2018-04-21: qty 6

## 2018-04-21 MED ORDER — BUDESONIDE 0.5 MG/2ML IN SUSP: 0.5000 mg | Freq: Two times a day (BID) | RESPIRATORY_TRACT | Status: DC

## 2018-04-21 NOTE — ED Notes (Signed)
Pt tolerating bipap well.  °

## 2018-04-21 NOTE — ED Provider Notes (Signed)
[facility] Emergency Department Provider Note  ____________________________________________   First MD Initiated Contact with Patient 04/21/18 [phone]     (approximate)  I have reviewed the triage vital signs and the nursing notes.   HISTORY  Chief Complaint Shortness of Breath   HPI Danielle [name] is a 62 y.o. female who comes to the emergency department via EMS with shortness of breath.  She has a long-standing history of COPD although only wears oxygen at night.  For the last 2 days she has been increasingly short of breath and using oxygen throughout the day.  When EMS arrived and noted she was saturating around 90%.  They gave her 5 mg of albuterol and 0.5 mg of Atrovent in route with minimal improvement in her symptoms.  The patient also has a remote history of right-sided breast cancer.  No history of heart failure DVT or pulmonary embolism.  She has had an increasingly productive cough recently.  Her symptoms been gradual onset slowly progressive are now severe.  They are worsened with initially improved with rest.    Past Medical History:  Diagnosis Date  . Anxiety   . Arthritis   . Cancer ([place]) left    breast cancer 2000, chemo tx's with total mastectomy and lymph nodes resected.   . Cancer of right lung ([place]) 02/21/2016   rad tx's.   [name] COPD (chronic obstructive pulmonary disease) ([place])   . Dependence on supplemental oxygen   . Depression   . Heart murmur   . Hypertension   . Lung nodule   . Lymphedema   . Shortness of breath dyspnea    with exertion  . Status post chemotherapy 2001   left breast cancer  . Status post radiation therapy 2001   left breast cancer    Patient Active Problem List   Diagnosis Date Noted  . Acute hypoxemic respiratory failure ([place]) 04/21/2018  . Hyponatremia 02/10/2017  . Syncope 02/09/2017  . Lung nodule, solitary 07/25/2016  . Primary cancer of right upper lobe of lung ([place]) 07/25/2016  . Carcinoma of  overlapping sites of left breast in female, estrogen receptor positive ([place]) 06/22/2016  . Multiple lung nodules 06/22/2016  . Cough   . Lesion of right lung   . Breast cancer in female [facility]) 02/21/2016  . DOE (dyspnea on exertion) 02/14/2016  . Moderate COPD (chronic obstructive pulmonary disease) ([place]) 08/24/2014    Past Surgical History:  Procedure Laterality Date  . Breast Biospy Left    [facility]  . BREAST SURGERY    . DILATION AND CURETTAGE OF UTERUS    . ELECTROMAGNETIC NAVIGATION BROCHOSCOPY Right 04/11/2016   Procedure: ELECTROMAGNETIC NAVIGATION BRONCHOSCOPY;  Surgeon: Vilinda Boehringer, MD;  Location: [facility] ORS;  Service: Cardiopulmonary;  Laterality: Right;  . history of colonoscopy]    . LUNG BIOPSY    . MASTECTOMY Left    2000, [facility]  . ROTATOR CUFF REPAIR Right    [facility]    Prior to Admission medications   Medication Sig Start Date End Date Taking? Authorizing Provider  albuterol (PROAIR HFA) 108 (90 BASE) MCG/ACT inhaler Inhale 1-2 puffs into the lungs every 6 (six) hours as needed for wheezing or shortness of breath.  06/28/15   [provider]  amLODipine (NORVASC) 10 MG tablet Take 10 mg by mouth daily. for blood pressure 05/21/16   [provider]  budesonide-formoterol (SYMBICORT) 160-4.5 MCG/ACT inhaler Inhale 2 puffs into the lungs 2 (two) times daily.  [provider]  celecoxib (CELEBREX) 200 MG capsule Take 1 capsule (200 mg total) by mouth daily. 07/23/15   Menshew, Dannielle Karvonen, PA-C  citalopram (CELEXA) 20 MG tablet Take 20 mg by mouth daily.     [provider]  HYDROcodone-acetaminophen (NORCO/VICODIN) 5-325 MG tablet Take 1 tablet by mouth every 4 (four) hours as needed for moderate pain. Patient not taking: Reported on 09/19/2017 02/10/17   Henreitta Leber, MD  losartan (COZAAR) 100 MG tablet Take 1 tablet (100 mg total) by mouth daily. 02/12/17   [name], MD  meloxicam (MOBIC) 7.5 MG tablet Take 7.5 mg by mouth  daily.  07/04/16   [provider]  potassium chloride SA (K-DUR,KLOR-CON) 20 MEQ tablet Take 1 tablet (20 mEq total) by mouth daily. 11/14/15   Forest Gleason, MD  tiotropium (SPIRIVA) 18 MCG inhalation capsule Place into inhaler and inhale. 06/28/15   [provider]    Allergies Patient has no known allergies.  Family History  Problem Relation Age of Onset  . Breast cancer Mother 13  . Cancer Mother        Breast   . Cirrhosis Father   . Breast cancer Paternal Aunt 64  . Cancer Maternal Aunt        Breast     Social History Social History   Tobacco Use  . Smoking status: Former Smoker    Packs/day: 0.50    Years: 20.00    Pack years: 10.00    Types: Cigarettes    Last attempt to quit: 07/02/2012    Years since quitting: 5.8  . Smokeless tobacco: Never Used  . Tobacco comment: quit 2014  Substance Use Topics  . Alcohol use: Yes    Comment: Occasionally  . Drug use: No    Review of Systems Constitutional: No fever/chills Eyes: No visual changes. ENT: No sore throat. Cardiovascular: Positive for chest pain. Respiratory: Positive for shortness of breath. Gastrointestinal: No abdominal pain.  No nausea, no vomiting.  No diarrhea.  No constipation. Genitourinary: Negative for dysuria. Musculoskeletal: Negative for back pain. Skin: Negative for rash. Neurological: Negative for headaches, focal weakness or numbness.   ____________________________________________   PHYSICAL EXAM:  VITAL SIGNS: ED Triage Vitals  Enc Vitals Group     BP      Pulse      Resp      Temp      Temp src      SpO2      Weight      Height      Head Circumference      Peak Flow      Pain Score      Pain Loc      Pain Edu?      Excl. in [place]?     Constitutional: Alert and oriented x4 in moderate respiratory distress sitting upright with pursed lip breathing using accessory muscles Eyes: PERRL EOMI. Head: Atraumatic. Nose: No congestion/rhinnorhea. Mouth/Throat:  No trismus Neck: No stridor.   Cardiovascular: Tachycardic rate, regular rhythm. Grossly normal heart sounds.  Good peripheral circulation. Respiratory: Moderate respiratory distress using accessory muscles with wheezes in all fields and prolonged expiratory phase Gastrointestinal: Soft nontender Musculoskeletal: No lower extremity edema   Neurologic:   No gross focal neurologic deficits are appreciated. Skin:  Skin is warm, dry and intact. No rash noted. Psychiatric: Anxious appearing    ____________________________________________   DIFFERENTIAL includes but not limited to  COPD exacerbation, CHF, pulmonary embolism, pneumonia,  pneumothorax ____________________________________________   LABS (all labs ordered are listed, but only abnormal results are displayed)  Labs Reviewed  COMPREHENSIVE METABOLIC PANEL - Abnormal; Notable for the following components:      Result Value   Potassium 2.5 (*)    CO2 15 (*)    Glucose, Bld 61 (*)    Creatinine, Ser 1.51 (*)    Calcium 6.9 (*)    Albumin 3.4 (*)    AST 182 (*)    ALT 100 (*)    Total Bilirubin 4.2 (*)    GFR calc non Af Amer 35 (*)    GFR calc Af Amer 41 (*)    Anion gap 21 (*)    All other components within normal limits  BRAIN NATRIURETIC PEPTIDE - Abnormal; Notable for the following components:   B Natriuretic Peptide 2,666.0 (*)    All other components within normal limits  TROPONIN I - Abnormal; Notable for the following components:   Troponin I 0.04 (*)    All other components within normal limits  CBC WITH DIFFERENTIAL/PLATELET - Abnormal; Notable for the following components:   RBC 2.64 (*)    Hemoglobin 9.3 (*)    HCT 27.2 (*)    MCV 103.3 (*)    MCH 35.2 (*)    RDW 15.4 (*)    All other components within normal limits  NA AND K (SODIUM & POTASSIUM), RAND UR  PROTEIN, URINE, RANDOM  UREA NITROGEN, URINE  CREATININE, URINE, RANDOM  PREALBUMIN  TROPONIN I  TROPONIN I  VITAMIN B12  FOLATE  CALCIUM,  IONIZED    Lab work reviewed by me with a number of abnormalities.  Low potassium likely somewhat contributed to by significant adrenergic response.  Elevated BNP and troponin probably secondary to demand and not primary myocardial ischemia __________________________________________  EKG    ____________________________________________  RADIOLOGY  Chest x-ray reviewed by me with no infiltrate but does show enlarging right sided hilar mass ____________________________________________   PROCEDURES  Procedure(s) performed: no  .Critical Care Performed by: Darel Hong, MD Authorized by: Darel Hong, MD   Critical care provider statement:    Critical care time (minutes):  30   Critical care time was exclusive of:  Separately billable procedures and treating other patients   Critical care was necessary to treat or prevent imminent or life-threatening deterioration of the following conditions:  Respiratory failure   Critical care was time spent personally by me on the following activities:  Development of treatment plan with patient or surrogate, discussions with consultants, evaluation of patient's response to treatment, examination of patient, obtaining history from patient or surrogate, ordering and performing treatments and interventions, ordering and review of laboratory studies, ordering and review of radiographic studies, pulse oximetry, re-evaluation of patient's condition and review of old charts    Critical Care performed: Yes  ____________________________________________   INITIAL IMPRESSION / ASSESSMENT AND PLAN / ED COURSE  Pertinent labs & imaging results that were available during my care of the patient were reviewed by me and considered in my medical decision making (see chart for details).   The patient arrives in moderate respiratory distress and quite wheezy.  I will give her 2 more duo nebs along with Solu-Medrol magnesium and some gentle IV fluids and  reevaluate.  Azithromycin as well given the COPD exacerbation.  The patient has flipped into atrial fibrillation with rapid ventricular response after all of the bronchial dilators.  She remains with significant respiratory distress and while she is  not hypoxic she is quite labored.  Decision was made to place on BiPAP with significant improvement in her symptoms.  At this point she requires inpatient admission for continued respiratory support and management of her respiratory distress.  Magnesium is already been given so we will begin repleting her K IV.      ____________________________________________   FINAL CLINICAL IMPRESSION(S) / ED DIAGNOSES  Final diagnoses:  COPD exacerbation ([place])  Hypokalemia  Acute kidney injury ([place])      NEW MEDICATIONS STARTED DURING THIS VISIT:  New Prescriptions   No medications on file     Note:  This document was prepared using Dragon voice recognition software and may include unintentional dictation errors.  you   Darel Hong, MD 04/21/18 [phone]

## 2018-04-21 NOTE — Progress Notes (Signed)
0645: Pt. Settled from ED. Pt. A&O x4, on Bipap @ 30%, one PIV saline locked. IV team bedside to place second access. Pt. In Afib currently, BP stable, Lung sounds diminished, pulses palpable.  0715: Report given to [facility].

## 2018-04-21 NOTE — H&P (Signed)
[facility] [place] NAME: Danielle [name]    MR#:  [id number]  DATE OF BIRTH:  [birth year]  DATE OF ADMISSION:  04/21/2018  PRIMARY CARE PHYSICIAN: Elisabeth Cara, NP   REQUESTING/REFERRING PHYSICIAN: Darel Hong, MD  CHIEF COMPLAINT:   Chief Complaint  Patient presents with  . Shortness of Breath    HISTORY OF PRESENT ILLNESS:  Danielle [name]  is a 62 y.o. female with a known history of HTN, COPD (2L [place] O2 PRN), known R hilar mass, Hx breast Ca p/w cough/SOB, acute on chronic hypoxemic respiratory failure/acute COPD exacerbation. Pt wheezing on ED arrival, started on BiPAP, Hx/ROS from pt limited. 60 of Hx obtained from pt's daughter at bedside. She states that the pt called EMS on 07/07 morning c/o SOB. EMS came out and treated pt, and she felt better, prompting her to refuse transport to the ED. She called her daughter after the fact to let her know that EMS had come. Her daughter checked on her during the day, and the pt told her she was feeling well. However, later in the evening, pt became SOB again, and EMS was called again. This time she was brought to the ED. Pt on BiPAP, but is able to tell me she has had dry non-productive cough x2-3d. She denies fever, chills, nausea/vomiting, chest pain or abdominal pain. She was reportedly labored and wheezing on ED arrival, but is breathing comfortably on BiPAP and has diffusely diminished but clear lung sounds at the time of my assessment. CXR (+) 3.5cm R hilar mass (known), (-) edema/effusion or new focal infiltrate. Admit to Stepdown.  PAST MEDICAL HISTORY:   Past Medical History:  Diagnosis Date  . Anxiety   . Arthritis   . Cancer ([place]) left    breast cancer 2000, chemo tx's with total mastectomy and lymph nodes resected.   . Cancer of right lung ([place]) 02/21/2016   rad tx's.   [name] COPD (chronic obstructive pulmonary disease) ([place])   . Dependence on supplemental oxygen   . Depression    . Heart murmur   . Hypertension   . Lung nodule   . Lymphedema   . Shortness of breath dyspnea    with exertion  . Status post chemotherapy 2001   left breast cancer  . Status post radiation therapy 2001   left breast cancer    PAST SURGICAL HISTORY:   Past Surgical History:  Procedure Laterality Date  . Breast Biospy Left    [facility]  . BREAST SURGERY    . DILATION AND CURETTAGE OF UTERUS    . ELECTROMAGNETIC NAVIGATION BROCHOSCOPY Right 04/11/2016   Procedure: ELECTROMAGNETIC NAVIGATION BRONCHOSCOPY;  Surgeon: Vilinda Boehringer, MD;  Location: [facility] ORS;  Service: Cardiopulmonary;  Laterality: Right;  . history of colonoscopy]    . LUNG BIOPSY    . MASTECTOMY Left    2000, [facility]  . ROTATOR CUFF REPAIR Right    [facility]    SOCIAL HISTORY:   Social History   Tobacco Use  . Smoking status: Former Smoker    Packs/day: 0.50    Years: 20.00    Pack years: 10.00    Types: Cigarettes    Last attempt to quit: 07/02/2012    Years since quitting: 5.8  . Smokeless tobacco: Never Used  . Tobacco comment: quit 2014  Substance Use Topics  . Alcohol use: Yes    Comment: Occasionally    FAMILY HISTORY:   Family History  Problem Relation Age of Onset  . Breast cancer Mother 58  . Cancer Mother        Breast   . Cirrhosis Father   . Breast cancer Paternal Aunt 16  . Cancer Maternal Aunt        Breast     DRUG ALLERGIES:  No Known Allergies  REVIEW OF SYSTEMS:   Review of Systems  Unable to perform ROS: Severe respiratory distress  Constitutional: Negative for chills and fever.  Respiratory: Positive for cough and shortness of breath. Negative for sputum production.   Cardiovascular: Negative for chest pain.  Gastrointestinal: Negative for abdominal pain, diarrhea, nausea and vomiting.   MEDICATIONS AT HOME:   Prior to Admission medications   Medication Sig Start Date End Date Taking? Authorizing Provider  albuterol (PROAIR HFA) 108 (90 BASE) MCG/ACT inhaler Inhale 1-2  puffs into the lungs every 6 (six) hours as needed for wheezing or shortness of breath.  06/28/15   [provider]  amLODipine (NORVASC) 10 MG tablet Take 10 mg by mouth daily. for blood pressure 05/21/16   [provider]  budesonide-formoterol (SYMBICORT) 160-4.5 MCG/ACT inhaler Inhale 2 puffs into the lungs 2 (two) times daily.    [provider]  celecoxib (CELEBREX) 200 MG capsule Take 1 capsule (200 mg total) by mouth daily. 07/23/15   Menshew, Dannielle Karvonen, PA-C  citalopram (CELEXA) 20 MG tablet Take 20 mg by mouth daily.     [provider]  HYDROcodone-acetaminophen (NORCO/VICODIN) 5-325 MG tablet Take 1 tablet by mouth every 4 (four) hours as needed for moderate pain. Patient not taking: Reported on 09/19/2017 02/10/17   Henreitta Leber, MD  losartan (COZAAR) 100 MG tablet Take 1 tablet (100 mg total) by mouth daily. 02/12/17   [name], MD  meloxicam (MOBIC) 7.5 MG tablet Take 7.5 mg by mouth daily.  07/04/16   [provider]  potassium chloride SA (K-DUR,KLOR-CON) 20 MEQ tablet Take 1 tablet (20 mEq total) by mouth daily. 11/14/15   Forest Gleason, MD  tiotropium (SPIRIVA) 18 MCG inhalation capsule Place into inhaler and inhale. 06/28/15   [provider]      VITAL SIGNS:  Blood pressure (!) 121/106, pulse (!) 150, resp. rate (!) 21, height 5\' 6"  (1.676 m), weight 63 kg (139 lb), SpO2 100 %.  PHYSICAL EXAMINATION:  Physical Exam  Constitutional: She is oriented to person, place, and time. She appears well-developed. She is active and cooperative.  Non-toxic appearance. She does not appear ill. No distress. She is not intubated. Face mask in place.  HENT:  Head: Atraumatic.  Eyes: Conjunctivae, EOM and lids are normal. No scleral icterus.  Neck: No JVD present. No thyromegaly present.  Cardiovascular: Regular rhythm, S1 normal and S2 normal.  No extrasystoles are present. Tachycardia present. Exam reveals no gallop, no  S3, no S4 and no friction rub.  Murmur heard.  Systolic murmur is present with a grade of 2/6.  No diastolic murmur is present. Pulmonary/Chest: No accessory muscle usage or stridor. Tachypnea noted. No apnea and no bradypnea. She is not intubated. No respiratory distress. She has decreased breath sounds in the right upper field, the right middle field, the right lower field, the left upper field, the left middle field and the left lower field. She has no wheezes. She has no rhonchi. She has no rales.  Abdominal: Soft. Bowel sounds are normal. She exhibits no distension. There is no tenderness. There is no rebound and no  guarding.  Musculoskeletal: Normal range of motion. She exhibits no edema or tenderness.       Right lower leg: Normal. She exhibits no tenderness and no edema.       Left lower leg: Normal. She exhibits no tenderness and no edema.  Lymphadenopathy:    She has no cervical adenopathy.  Neurological: She is alert and oriented to person, place, and time. She is not disoriented.  Skin: Skin is warm, dry and intact. No rash noted. She is not diaphoretic. No erythema.  Psychiatric: She has a normal mood and affect. Her behavior is normal. Judgment and thought content normal. Her mood appears not anxious. She is not agitated. Cognition and memory are normal.   Alert. Breathing comfortably on BiPAP. Lungs diminished diffusely B/L across all lung fields, (-) active wheezing, no adventitious lung sounds appreciated. Tachycardic 2/2 nebs. Thin. LABORATORY PANEL:   CBC Recent Labs  Lab 04/21/18 0358  WBC 7.9  HGB 9.3*  HCT 27.2*  PLT 229   ------------------------------------------------------------------------------------------------------------------  Chemistries  Recent Labs  Lab 04/21/18 0358  NA 142  K 2.5*  CL 106  CO2 15*  GLUCOSE 61*  BUN 16  CREATININE 1.51*  CALCIUM 6.9*  AST 182*  ALT 100*  ALKPHOS 102  BILITOT 4.2*    ------------------------------------------------------------------------------------------------------------------  Cardiac Enzymes Recent Labs  Lab 04/21/18 0358  TROPONINI 0.04*   ------------------------------------------------------------------------------------------------------------------  RADIOLOGY:  Dg Chest Port 1 View  Result Date: 04/21/2018 CLINICAL DATA:  Initial evaluation for acute shortness of breath, history of COPD. EXAM: PORTABLE CHEST 1 VIEW COMPARISON:  Prior radiograph from 04/19/2017 as well as prior CT from 09/12/2017. FINDINGS: Mild cardiomegaly, stable. Aortic atherosclerosis. Increased masslike density at the right hilum measuring approximately 3.7 x 3.5 cm. Adjacent right perihilar scarring and architectural distortion. No other focal infiltrates. No pleural effusion or pulmonary edema. Chronic bronchitic changes. No pneumothorax. No acute osseous abnormality. IMPRESSION: 1. Increased prominence of 3.5 cm mass like opacity at the right hilum, concerning for worsened malignancy. Further assessment with cross-sectional imaging of the chest suggested for further characterization. 2. Underlying chronic emphysematous changes. 3. Cardiomegaly with aortic atherosclerosis. Electronically Signed   By: Jeannine Boga M.D.   On: 04/21/2018 04:54   IMPRESSION AND PLAN:   A/P: 53F acute hypoxemic respiratory failure, acute COPD exacerbation, dehydration, hypokalemia, hypoglycemia, renal failure, hypocalcemia, hypoalbuminemia, transaminasemia/hyperbilirubinemia, BNP elevation, Troponin-I elevation, macrocytic anemia. -Admit to Stepdown -Continuous cardiac monitoring -Pulse Ox -BiPAP, wean to [place] O2 (uses [place] PRN @ home) -DuoNebs -Steroids (IV, transition to PO) -Dulera (formulary sub for home Symbicort) -Azithromycin indicated in severe COPD exacerbation even in absence of infxn -Incentive spirometry -Pulmonary toileting -CXR (+) known R hilar mass, (-)  edema/effusion or new focal infiltrate -Dehydrated, IVF -K+ 2.5, replete -Mag level pending, received Mag in ED -Glucose 61 on admission; may be due to malnourishment/poor intake, renal failure w/ increased circulating insulin level. FSG q4h, D50 PRN low FSG, D5-containing IVF -Cr 1.51 on admission, unknown if acute or chronic; prior labwork demonstrates Cr of 0.69 as of 09/19/2017, Cr of 0.90 as of 09/12/2017. Previous baseline likely 0.6-0.9. Possible AKI (vs. CKD w/ new baseline) on present admission. Renal U/S, urine studies (electrolytes, protein, creatinine, urea nitrogen) pending. IVF, monitor BMP for improvement, avoid nephrotoxins -Hold Cozaar, Mobic, Celebrex 2/2 renal failure -Phos level pending -Hypocalcemia likely 2/2 hypoalbuminemia, ionized calcium pending -Hypoalbuminemia possibly 2/2 malnourishment, prealbumin pending -Transaminasemia/hyperbilirubinemia possibly 2/2 dehydration vs. chronic diastolic CHF (grade 2, per 02/10/2017 Echo) w/ passive liver  congestion vs. Ca vs. other; (-) AP -BNP elevated, clinically dehydrated -Trop-I 0.04, likely elevated 2/2 demand + decreased renal clearance; rpt x2 pending; denies CP -Hgb 9.3, MCV 103.3. B12 and Folate levels pending -c/w other home meds/formulary subs -Cardiac diet -Heparin -Full code -Admission, > 2 midnights   All the records are reviewed and case discussed with ED provider. Management plans discussed with the patient, family and they are in agreement.  CODE STATUS: Full code  TOTAL TIME TAKING CARE OF THIS PATIENT: 90 minutes.    Arta Silence M.D on 04/21/2018 at 5:51 AM  Between 7am to 6pm - Pager - [phone]  After 6pm go to [email or web address] - Proofreader  [facility]  Office  [phone]  CC: Primary care physician; Elisabeth Cara, NP   Note: This dictation was prepared with Dragon dictation along with smaller phrase technology. Any transcriptional errors that  result from this process are unintentional.

## 2018-04-21 NOTE — Consult Note (Addendum)
[facility]  CARDIOLOGY CONSULT NOTE  Patient ID: Danielle [name] MRN: [id number] DOB/AGE: [birth year] 62 y.o.  Admit date: 04/21/2018 Referring Physician Dr. Mortimer Fries Primary Physician Elisabeth Cara Primary Cardiologist Dr. Serafina Royals  Reason for Consultation New onset atrial fibrillation   HPI: Danielle [name] is a 61 year old female with a history of COPD and breast cancer who presented to the ED on 04/21/18 with shortness of breath. She developed severe shortness of breath when using the restroom Sunday evening, which prompted her to call EMS. Upon arrival, she was saturating around 90% and was admitted for acute hypoxemic respiratory failure. EMS noted patient to be in new onset atrial fibrillation which warranted the cardiology consult. She is currently not experiencing chest pain or palpitations, but does complain of shortness of breath, which has improved upon admission, and bilateral foot swelling that has worsened over the past 2 days. She denies fatigue, dizziness, abdominal pain, nausea, or vomiting. She does have a history of palpitations or a "heart cramping sensation" which has been going on for several years.   Danielle [name] is followed by Dr. [name] and Etta Quill at [facility]. Echo done in January 2019 showed EF at 45% with mild LVH, mild AS, mild AR, moderate MR, and mild TR. Most recent exercise stress test showed decreased exercise tolerance with mild ST depression in the inferior leads that where non-diagnostic. Most recent Holter monitor showed sinus rhythm with PVCs and brief runs of tachycardia.   Review of systems complete and found to be negative unless listed above     Past Medical History:  Diagnosis Date  . Anxiety   . Arthritis   . Cancer ([place]) left    breast cancer 2000, chemo tx's with total mastectomy and lymph nodes resected.   . Cancer of right lung ([place]) 02/21/2016   rad tx's.   [name] COPD (chronic obstructive pulmonary disease) ([place])   . Dependence on  supplemental oxygen   . Depression   . Heart murmur   . Hypertension   . Lung nodule   . Lymphedema   . Shortness of breath dyspnea    with exertion  . Status post chemotherapy 2001   left breast cancer  . Status post radiation therapy 2001   left breast cancer    Past Surgical History:  Procedure Laterality Date  . Breast Biospy Left    [facility]  . BREAST SURGERY    . DILATION AND CURETTAGE OF UTERUS    . ELECTROMAGNETIC NAVIGATION BROCHOSCOPY Right 04/11/2016   Procedure: ELECTROMAGNETIC NAVIGATION BRONCHOSCOPY;  Surgeon: Vilinda Boehringer, MD;  Location: [facility] ORS;  Service: Cardiopulmonary;  Laterality: Right;  . history of colonoscopy]    . LUNG BIOPSY    . MASTECTOMY Left    2000, [facility]  . ROTATOR CUFF REPAIR Right    [facility]    Medications Prior to Admission  Medication Sig Dispense Refill Last Dose  . citalopram (CELEXA) 40 MG tablet Take 40 mg by mouth daily.    Unknown at Unknown  . losartan (COZAAR) 100 MG tablet Take 1 tablet (100 mg total) by mouth daily. 30 tablet 0 Unknown at Unknown  . celecoxib (CELEBREX) 200 MG capsule Take 1 capsule (200 mg total) by mouth daily. (Patient not taking: Reported on 04/21/2018) 30 capsule 0 Not Taking at Unknown time  . metoprolol succinate (TOPROL-XL) 25 MG 24 hr tablet Take 25 mg by mouth daily.   Not Taking at Unknown  . potassium chloride SA (K-DUR,KLOR-CON) 20  MEQ tablet Take 1 tablet (20 mEq total) by mouth daily. (Patient not taking: Reported on 04/21/2018) 30 tablet 6 Not Taking at Unknown time   Social History   Socioeconomic History  . Marital status: Divorced    Spouse name: Not on file  . Number of children: Not on file  . Years of education: Not on file  . Highest education level: Not on file  Occupational History  . Not on file  Social Needs  . Financial resource strain: Not on file  . Food insecurity:    Worry: Not on file    Inability: Not on file  . Transportation needs:    Medical: Not on file    Non-medical: Not on  file  Tobacco Use  . Smoking status: Former Smoker    Packs/day: 0.50    Years: 20.00    Pack years: 10.00    Types: Cigarettes    Last attempt to quit: 07/02/2012    Years since quitting: 5.8  . Smokeless tobacco: Never Used  . Tobacco comment: quit 2014  Substance and Sexual Activity  . Alcohol use: Yes    Comment: Occasionally  . Drug use: No  . Sexual activity: Never  Lifestyle  . Physical activity:    Days per week: Not on file    Minutes per session: Not on file  . Stress: Not on file  Relationships  . Social connections:    Talks on phone: Not on file    Gets together: Not on file    Attends religious service: Not on file    Active member of club or organization: Not on file    Attends meetings of clubs or organizations: Not on file    Relationship status: Not on file  . Intimate partner violence:    Fear of current or ex partner: Not on file    Emotionally abused: Not on file    Physically abused: Not on file    Forced sexual activity: Not on file  Other Topics Concern  . Not on file  Social History Narrative  . Not on file    Family History  Problem Relation Age of Onset  . Breast cancer Mother 41  . Cancer Mother        Breast   . Cirrhosis Father   . Breast cancer Paternal Aunt 23  . Cancer Maternal Aunt        Breast       Review of systems complete and found to be negative unless listed above      PHYSICAL EXAM  General: Well developed, well nourished, in no acute distress HEENT:  Normocephalic and atramatic Neck:  No JVD.  Lungs: Expiratory wheezes noted bilaterally  Heart: Irregular rate, irregular rhythm. No murmurs or gallops  Abdomen: Bowel sounds are positive, abdomen soft and non-tender  Msk:  Back normal. Normal strength and tone for age. Gait not assessed Extremities: No clubbing or cyanosis. 1+ pitting edema in feet bilaterally  Neuro: Alert and oriented X 3. Psych:  Good affect, responds appropriately  Labs:   Lab Results   Component Value Date   WBC 7.9 04/21/2018   HGB 9.3 (L) 04/21/2018   HCT 27.2 (L) 04/21/2018   MCV 103.3 (H) 04/21/2018   PLT 229 04/21/2018    Recent Labs  Lab 04/21/18 0358  NA 142  K 2.5*  CL 106  CO2 15*  BUN 16  CREATININE 1.51*  CALCIUM 6.9*  PROT 7.5  BILITOT 4.2*  ALKPHOS 102  ALT 100*  AST 182*  GLUCOSE 61*   Lab Results  Component Value Date   CKTOTAL 207 06/26/2013   CKMB 1.9 06/27/2013   TROPONINI 0.04 (HH) 04/21/2018   No results found for: CHOL No results found for: HDL No results found for: LDLCALC No results found for: TRIG No results found for: CHOLHDL No results found for: LDLDIRECT    Radiology: Dg Chest Port 1 View  Result Date: 04/21/2018 CLINICAL DATA:  Initial evaluation for acute shortness of breath, history of COPD. EXAM: PORTABLE CHEST 1 VIEW COMPARISON:  Prior radiograph from 04/19/2017 as well as prior CT from 09/12/2017. FINDINGS: Mild cardiomegaly, stable. Aortic atherosclerosis. Increased masslike density at the right hilum measuring approximately 3.7 x 3.5 cm. Adjacent right perihilar scarring and architectural distortion. No other focal infiltrates. No pleural effusion or pulmonary edema. Chronic bronchitic changes. No pneumothorax. No acute osseous abnormality. IMPRESSION: 1. Increased prominence of 3.5 cm mass like opacity at the right hilum, concerning for worsened malignancy. Further assessment with cross-sectional imaging of the chest suggested for further characterization. 2. Underlying chronic emphysematous changes. 3. Cardiomegaly with aortic atherosclerosis. Electronically Signed   By: Jeannine Boga M.D.   On: 04/21/2018 04:54    EKG: Atrial fibrillation   ASSESSMENT AND PLAN:  1. New onset atrial fibrillation   - Rate control with 10mg  Cardizem, consider IV Cardizem drip if rate remains uncontrolled  - Likely due to underlying hypoxia   - Consider anticoagulation for stroke prevention   - Follow up outpatient  cardiology for Holter monitor, further evaluation   -Hypokalemia-replete potassium to a level greater than 4. 2. Elevated troponin (.04)  - Likely due to underlying COPD, hypoxia  - Unlikely due to ischemia  3. Acute Hypoxic Respiratory Failure    - Agree with current hospitalist plan   - Further plan pending echo results   The history, physical exam findings, and plan of care were discussed with Dr. Bartholome Bill and all decisions were made in collaboration.   Signed: Avie Arenas PA-C 04/21/2018, 11:24 AM  Agree with above.  Patient was seen and examined and plan discussed.

## 2018-04-21 NOTE — Progress Notes (Signed)
Called Dr Mortimer Fries patients is having difficulty breathing heart rate up to 170's. Patient unable to tolerate laying flat. Per Dr Mortimer Fries place bi pap back on. Cardiology at bedside ordered Cardizem waiting to be tubed from pharmacy. Patient unable to lay flat for CT at this time.

## 2018-04-21 NOTE — Progress Notes (Signed)
Spoke with Dr Mortimer Fries regarding patients heart rate 140-150's. Patient has some epi-gastric abdominal discomfort. Patients states that the potassium is burning her arm. Per Dr Mortimer Fries Cardizem ordered, stop potassium. He will be in to evaluate patient.

## 2018-04-21 NOTE — Progress Notes (Signed)
[facility] [place] NAME: Danielle [name]    MR#:  [id number]  DATE OF BIRTH:  08-Nov-1955  SUBJECTIVE:  CHIEF COMPLAINT:   Chief Complaint  Patient presents with  . Shortness of Breath   Still has shortness of breath, off BiPAP on oxygen 2 L. REVIEW OF SYSTEMS:  Review of Systems  Constitutional: Positive for malaise/fatigue. Negative for chills and fever.  HENT: Negative for sore throat.   Eyes: Negative for blurred vision and double vision.  Respiratory: Positive for cough and shortness of breath. Negative for hemoptysis, wheezing and stridor.   Cardiovascular: Negative for chest pain, palpitations, orthopnea and leg swelling.  Gastrointestinal: Negative for abdominal pain, blood in stool, diarrhea, melena, nausea and vomiting.  Genitourinary: Negative for dysuria, flank pain and hematuria.  Musculoskeletal: Negative for back pain and joint pain.  Skin: Negative for rash.  Neurological: Negative for dizziness, sensory change, focal weakness, seizures, loss of consciousness, weakness and headaches.  Endo/Heme/Allergies: Negative for polydipsia.  Psychiatric/Behavioral: Negative for depression. The patient is not nervous/anxious.     DRUG ALLERGIES:  No Known Allergies VITALS:  Blood pressure 98/79, pulse (!) 109, temperature (!) 97.5 F (36.4 C), resp. rate 15, height 5\' 6"  (1.676 m), weight 132 lb 11.5 oz (60.2 kg), SpO2 97 %. PHYSICAL EXAMINATION:  Physical Exam  Constitutional: She is oriented to person, place, and time. She appears well-developed.  HENT:  Head: Normocephalic.  Mouth/Throat: Oropharynx is clear and moist.  Eyes: Pupils are equal, round, and reactive to light. Conjunctivae and EOM are normal. No scleral icterus.  Neck: Normal range of motion. Neck supple. No JVD present. No tracheal deviation present.  Cardiovascular: Normal rate, regular rhythm and normal heart sounds. Exam reveals no gallop.  No murmur  heard. Pulmonary/Chest: Effort normal. No respiratory distress. She has no wheezes. She has no rales.  Diminished lung sounds  Abdominal: Soft. Bowel sounds are normal. She exhibits no distension. There is no tenderness. There is no rebound.  Musculoskeletal: Normal range of motion. She exhibits no edema or tenderness.  Neurological: She is alert and oriented to person, place, and time. No cranial nerve deficit.  Skin: No rash noted. No erythema.  Psychiatric: She has a normal mood and affect.   LABORATORY PANEL:  Female CBC Recent Labs  Lab 04/21/18 0358  WBC 7.9  HGB 9.3*  HCT 27.2*  PLT 229   ------------------------------------------------------------------------------------------------------------------ Chemistries  Recent Labs  Lab 04/21/18 0358 04/21/18 0641  NA 142  --   K 2.5*  --   CL 106  --   CO2 15*  --   GLUCOSE 61*  --   BUN 16  --   CREATININE 1.51*  --   CALCIUM 6.9*  --   MG  --  1.9  AST 182*  --   ALT 100*  --   ALKPHOS 102  --   BILITOT 4.2*  --    RADIOLOGY:  US Venous Img Lower Bilateral  Result Date: 04/21/2018 CLINICAL DATA:  Bilateral lower extremity pain and edema. History of fall 2 weeks ago. History of breast cancer. Evaluate for DVT. EXAM: BILATERAL LOWER EXTREMITY VENOUS DOPPLER ULTRASOUND TECHNIQUE: Gray-scale sonography with graded compression, as well as color Doppler and duplex ultrasound were performed to evaluate the lower extremity deep venous systems from the level of the common femoral vein and including the common femoral, femoral, profunda femoral, popliteal and calf veins including the posterior tibial, peroneal and gastrocnemius  veins when visible. The superficial great saphenous vein was also interrogated. Spectral Doppler was utilized to evaluate flow at rest and with distal augmentation maneuvers in the common femoral, femoral and popliteal veins. COMPARISON:  None. FINDINGS: RIGHT LOWER EXTREMITY Common Femoral Vein: No evidence  of thrombus. Normal compressibility, respiratory phasicity and response to augmentation. Saphenofemoral Junction: No evidence of thrombus. Normal compressibility and flow on color Doppler imaging. Profunda Femoral Vein: No evidence of thrombus. Normal compressibility and flow on color Doppler imaging. Femoral Vein: No evidence of thrombus. Normal compressibility, respiratory phasicity and response to augmentation. Popliteal Vein: No evidence of thrombus. Normal compressibility, respiratory phasicity and response to augmentation. Calf Veins: No evidence of thrombus. Normal compressibility and flow on color Doppler imaging. Superficial Great Saphenous Vein: No evidence of thrombus. Normal compressibility. Venous Reflux:  None. Other Findings:  None. LEFT LOWER EXTREMITY Common Femoral Vein: No evidence of thrombus. Normal compressibility, respiratory phasicity and response to augmentation. Saphenofemoral Junction: No evidence of thrombus. Normal compressibility and flow on color Doppler imaging. Profunda Femoral Vein: No evidence of thrombus. Normal compressibility and flow on color Doppler imaging. Femoral Vein: No evidence of thrombus. Normal compressibility, respiratory phasicity and response to augmentation. Popliteal Vein: No evidence of thrombus. Normal compressibility, respiratory phasicity and response to augmentation. Calf Veins: No evidence of thrombus. Normal compressibility and flow on color Doppler imaging. Superficial Great Saphenous Vein: No evidence of thrombus. Normal compressibility. Venous Reflux:  None. Other Findings:  None. IMPRESSION: No evidence of DVT within either lower extremity. Electronically Signed   By: Sandi Mariscal M.D.   On: 04/21/2018 14:13   Dg Chest Port 1 View  Result Date: 04/21/2018 CLINICAL DATA:  Initial evaluation for acute shortness of breath, history of COPD. EXAM: PORTABLE CHEST 1 VIEW COMPARISON:  Prior radiograph from 04/19/2017 as well as prior CT from 09/12/2017.  FINDINGS: Mild cardiomegaly, stable. Aortic atherosclerosis. Increased masslike density at the right hilum measuring approximately 3.7 x 3.5 cm. Adjacent right perihilar scarring and architectural distortion. No other focal infiltrates. No pleural effusion or pulmonary edema. Chronic bronchitic changes. No pneumothorax. No acute osseous abnormality. IMPRESSION: 1. Increased prominence of 3.5 cm mass like opacity at the right hilum, concerning for worsened malignancy. Further assessment with cross-sectional imaging of the chest suggested for further characterization. 2. Underlying chronic emphysematous changes. 3. Cardiomegaly with aortic atherosclerosis. Electronically Signed   By: Jeannine Boga M.D.   On: 04/21/2018 04:54   ASSESSMENT AND PLAN:   A/P: 19F acute hypoxemic respiratory failure, acute COPD exacerbation, dehydration, hypokalemia, hypoglycemia, renal failure, hypocalcemia, hypoalbuminemia, transaminasemia/hyperbilirubinemia, BNP elevation, Troponin-I elevation, macrocytic anemia.  Acute on chronic respiratory failure with hypoxia due to COPD exacerbation. The patient is off BiPAP, on oxygen by nasal cannula. Continue nebulizer and DuoNeb as needed, continue Zithromax and steroid.  Hypokalemia.  Potassium supplement and follow-up level.  Acute renal failure due to ATN.  Follow-up BMP.  Elevated troponin due to demanding ischemia.  Follow-up cardiology consult recommendation.  New onset A. fib with RVR.  The patient was treated with Cardizem IV without improvement.  Cardizem drip was started.  Consider anticoagulation for stroke prevention per Dr. Ubaldo Glassing.  Echocardiogram showed ejection fraction 35 to 40%.  All the records are reviewed and case discussed with Care Management/Social Worker. Management plans discussed with the patient, family and they are in agreement.   CODE STATUS: Full Code  TOTAL TIME TAKING CARE OF THIS PATIENT: 36 minutes.   More than 50% of the time was  spent  in counseling/coordination of care: YES  POSSIBLE D/C IN 3 DAYS, DEPENDING ON CLINICAL CONDITION.   Demetrios Loll M.D on 04/21/2018 at 4:47 PM  Between 7am to 6pm - Pager - [phone]  After 6pm go to [email or web address] - [facility]

## 2018-04-21 NOTE — Progress Notes (Signed)
Pharmacy Electrolyte Monitoring Consult:  Pharmacy consulted to assist in monitoring and replacing electrolytes in this 62 y.o. female admitted on 04/21/2018 with Shortness of Breath  Patient received furosemide 40mg  IV x 1 on 7/8.   Labs:  Sodium (mmol/L)  Date Value  04/21/2018 142  02/09/2015 132 (L)   Potassium (mmol/L)  Date Value  04/21/2018 2.5 (LL)  02/09/2015 3.8   Magnesium (mg/dL)  Date Value  04/21/2018 1.9  12/07/2013 1.8   Phosphorus (mg/dL)  Date Value  04/21/2018 4.7 (H)   Calcium (mg/dL)  Date Value  04/21/2018 6.9 (L)   Calcium, Total (mg/dL)  Date Value  02/09/2015 9.1   Albumin (g/dL)  Date Value  04/21/2018 3.4 (L)  02/09/2015 4.3    Assessment/Plan: Patient received potassium 23mEq PO x 2 and potassium 62mEq IV x 1. Magnesium 2g IV replaced for goal magnesium ~ 2.   Will recheck potassium at 1800. Patient will need to continue to received oral replacement as tolerated.   Pharmacy will continue to monitor and adjust per consult.   Sanjit Mcmichael L 04/21/2018 4:32 PM

## 2018-04-21 NOTE — Progress Notes (Signed)
Advanced Care Plan.  Purpose of Encounter: CODE STATUS. Parties in Attendance: The patient and me. Patient's Decisional Capacity: Yes. Medical Story: 17F acute hypoxemic respiratory failure, acute COPD exacerbation, dehydration, hypokalemia, hypoglycemia, renal failure, hypocalcemia, hypoalbuminemia, transaminasemia/hyperbilirubinemia, BNP elevation, Troponin-I elevation, macrocytic anemia.  She is admitted for acute on chronic respiratory failure with hypoxia due to COPD exacerbation, acute renal failure, new onset A. fib with RVR and hypokalemia.  I discussed with the patient about her current condition, prognosis and CODE STATUS.  The patient stated that she wants to be resuscitated and intubated to get her back.  Plan:  Code Status: Full code Time spent discussing advance care planning: 17 minutes.

## 2018-04-21 NOTE — Care Management (Signed)
RNCM met with patient and her daughter. She does not have health insurance. She indicated that she has sent off some paper work ?possibly to Medicare as she mentioned social security also. She is on chronic O2 that she states she pays privately for. Application to [facility] Med management delivered to patient.

## 2018-04-21 NOTE — ED Triage Notes (Signed)
Pt from home with shortness of breath. Pt with history of copd. Pt received duoneb enroute, able to speak in full sentences.

## 2018-04-21 NOTE — Progress Notes (Signed)
*  PRELIMINARY RESULTS* Echocardiogram 2D Echocardiogram has been performed.  Sherrie Sport 04/21/2018, 12:57 PM

## 2018-04-21 NOTE — Progress Notes (Signed)
Pharmacy Electrolyte Monitoring Consult:  Pharmacy consulted to assist in monitoring and replacing electrolytes in this 62 y.o. female admitted on 04/21/2018 with Shortness of Breath  Patient received furosemide 40mg  IV x 1 on 7/8. Patient has received 43mEq or oral potassium and 16mEq of IV potassium on 7/8.   Labs:  Sodium (mmol/L)  Date Value  04/21/2018 142  02/09/2015 132 (L)   Potassium (mmol/L)  Date Value  04/21/2018 3.0 (L)  02/09/2015 3.8   Magnesium (mg/dL)  Date Value  04/21/2018 1.9  12/07/2013 1.8   Phosphorus (mg/dL)  Date Value  04/21/2018 4.7 (H)   Calcium (mg/dL)  Date Value  04/21/2018 6.9 (L)   Calcium, Total (mg/dL)  Date Value  02/09/2015 9.1   Albumin (g/dL)  Date Value  04/21/2018 3.4 (L)  02/09/2015 4.3    Assessment/Plan: Will order potassium 29mEq PO x 1 and 31mEq to be given at 2200. Will recheck all electrolytes with am labs.   Patient will need to continue to received oral replacement as tolerated.   Pharmacy will continue to monitor and adjust per consult.   Danielle [name] L 04/21/2018 6:46 PM

## 2018-04-21 NOTE — Progress Notes (Signed)
Spoke with Dr Mortimer Fries during rounds regarding patients heart rate. Consult placed for cardiology. At this point no additional orders given.

## 2018-04-21 NOTE — Consult Note (Signed)
PULMONARY / CRITICAL CARE MEDICINE   Name: Danielle [name] MRN: [id number] DOB: 04-29-1956    ADMISSION DATE:  04/21/2018   CONSULTATION DATE: 04/21/2018  REFERRING MD: Dr. Jodell Cipro   Recent acute hypoxic respiratory failure secondary to COPD exacerbation and hilar mass  HISTORY OF PRESENT ILLNESS:   This is a 62 year old African-American female with varices medical history as indicated below who presented to the ED via EMS with shortness of breath.  States her symptoms started 2 days ago and progressively got worse hence she decided to call EMS today.  Symptoms associated with a productive cough.  She denies fever and chills.  When EMS arrived, patient's SPO2 was 90% on 2 L nasal cannula.  She received a DuoNeb treatment and route to the ED.  Her ED work-up was significant for an elevated proBNP level of 2666, potassium level of 2.5, hemoglobin of 9.3 and a creatinine of 1.5 up from her baseline of 0.69.  Her chest x-ray showed increased prominence of her hilar mass of approximately 3.5 cm suggestive of worsening malignancy.  She is being admitted to the ICU for further management. She was also noted to have a scabies infestation  PAST MEDICAL HISTORY :  She  has a past medical history of Anxiety, Arthritis, Cancer ([place]) (left ), Cancer of right lung ([place]) (02/21/2016), COPD (chronic obstructive pulmonary disease) ([place]), Dependence on supplemental oxygen, Depression, Heart murmur, Hypertension, Lung nodule, Lymphedema, Shortness of breath dyspnea, Status post chemotherapy (2001), and Status post radiation therapy (2001).  PAST SURGICAL HISTORY: She  has a past surgical history that includes Lung biopsy; Breast Biospy (Left); Rotator cuff repair (Right); Breast surgery; Mastectomy (Left); Dilation and curettage of uterus; Electormagnetic navigation bronchoscopy (Right, 04/11/2016); and history of colonoscopy].  No Known Allergies  No current facility-administered medications on file prior to  encounter.    Current Outpatient Medications on File Prior to Encounter  Medication Sig  . albuterol (PROAIR HFA) 108 (90 BASE) MCG/ACT inhaler Inhale 1-2 puffs into the lungs every 6 (six) hours as needed for wheezing or shortness of breath.   [name] amLODipine (NORVASC) 10 MG tablet Take 10 mg by mouth daily. for blood pressure  . budesonide-formoterol (SYMBICORT) 160-4.5 MCG/ACT inhaler Inhale 2 puffs into the lungs 2 (two) times daily.  . celecoxib (CELEBREX) 200 MG capsule Take 1 capsule (200 mg total) by mouth daily.  . citalopram (CELEXA) 20 MG tablet Take 20 mg by mouth daily.   [name] HYDROcodone-acetaminophen (NORCO/VICODIN) 5-325 MG tablet Take 1 tablet by mouth every 4 (four) hours as needed for moderate pain. (Patient not taking: Reported on 09/19/2017)  . losartan (COZAAR) 100 MG tablet Take 1 tablet (100 mg total) by mouth daily.  . meloxicam (MOBIC) 7.5 MG tablet Take 7.5 mg by mouth daily.   . potassium chloride SA (K-DUR,KLOR-CON) 20 MEQ tablet Take 1 tablet (20 mEq total) by mouth daily.  [name] tiotropium (SPIRIVA) 18 MCG inhalation capsule Place into inhaler and inhale.    FAMILY HISTORY:  Her indicated that her mother is deceased. She indicated that her father is deceased. She indicated that the status of her maternal aunt is unknown. She indicated that the status of her paternal aunt is unknown.   SOCIAL HISTORY: She  reports that she quit smoking about 5 years ago. Her smoking use included cigarettes. She has a 10.00 pack-year smoking history. She has never used smokeless tobacco. She reports that she drinks alcohol. She reports that she does not use drugs.  REVIEW  OF SYSTEMS:   Unable to obtain full review of systems as patient is on continuous BiPAP  SUBJECTIVE:   VITAL SIGNS: BP 128/84   Pulse (!) 134   Temp (!) 95.7 F (35.4 C) (Axillary)   Resp 19   Ht 5\' 6"  (1.676 m)   Wt 132 lb 11.5 oz (60.2 kg)   SpO2 100%   BMI 21.42 kg/m   HEMODYNAMICS:    VENTILATOR  SETTINGS: FiO2 (%):  [30 %] 30 %  INTAKE / OUTPUT: I/O last 3 completed shifts: In: 550 [IV Piggyback:550] Out: 100 [Urine:100]  PHYSICAL EXAMINATION: General: Appears cachectic, unkempt, in moderate respiratory distress Neuro: Alert and oriented to person and place, speech is normal, moves all extremities HEENT: PERRLA, trachea midline, mild JVD Cardiovascular: Apical pulse regular, S1-S2, no murmur regurg or gallop, +2 pulses bilaterally, +2 pitting edema Lungs:  Breath sounds diminished in all lung fields, mild bilateral wheezes Abdomen: Nondistended, normal bowel sounds in all 4 quadrants Musculoskeletal: No joint swelling, positive range of motion Skin: Warm and dry, multiple insect bites with small open areas in upper and lower extremities as well as torso consistent with scabies infestation  LABS:  BMET Recent Labs  Lab 04/21/18 0358  NA 142  K 2.5*  CL 106  CO2 15*  BUN 16  CREATININE 1.51*  GLUCOSE 61*    Electrolytes Recent Labs  Lab 04/21/18 0358 04/21/18 0641  CALCIUM 6.9*  --   MG  --  1.9  PHOS  --  4.7*    CBC Recent Labs  Lab 04/21/18 0358  WBC 7.9  HGB 9.3*  HCT 27.2*  PLT 229    Coag's No results for input(s): APTT, INR in the last 168 hours.  Sepsis Markers No results for input(s): LATICACIDVEN, PROCALCITON, O2SATVEN in the last 168 hours.  ABG No results for input(s): PHART, PCO2ART, PO2ART in the last 168 hours.  Liver Enzymes Recent Labs  Lab 04/21/18 0358  AST 182*  ALT 100*  ALKPHOS 102  BILITOT 4.2*  ALBUMIN 3.4*    Cardiac Enzymes Recent Labs  Lab 04/21/18 0358 04/21/18 0641  TROPONINI 0.04* 0.04*    Glucose Recent Labs  Lab 04/21/18 0637  GLUCAP 75    Imaging Dg Chest Port 1 View  Result Date: 04/21/2018 CLINICAL DATA:  Initial evaluation for acute shortness of breath, history of COPD. EXAM: PORTABLE CHEST 1 VIEW COMPARISON:  Prior radiograph from 04/19/2017 as well as prior CT from 09/12/2017.  FINDINGS: Mild cardiomegaly, stable. Aortic atherosclerosis. Increased masslike density at the right hilum measuring approximately 3.7 x 3.5 cm. Adjacent right perihilar scarring and architectural distortion. No other focal infiltrates. No pleural effusion or pulmonary edema. Chronic bronchitic changes. No pneumothorax. No acute osseous abnormality. IMPRESSION: 1. Increased prominence of 3.5 cm mass like opacity at the right hilum, concerning for worsened malignancy. Further assessment with cross-sectional imaging of the chest suggested for further characterization. 2. Underlying chronic emphysematous changes. 3. Cardiomegaly with aortic atherosclerosis. Electronically Signed   By: Jeannine Boga M.D.   On: 04/21/2018 04:54    ANTIBIOTICS: Azithromycin COPD exacerbation  SIGNIFICANT EVENTS: 7/8: Admitted  LINES/TUBES: Peripheral IVs  DISCUSSION: 62 year old female with a history of left breast cancer status post radiation and chemotherapy, right lung cancer diagnosed in 2017, status post radiation treatments and history of lung nodule presenting with acute hypoxic respiratory failure and worsening hilar mass suggestive of recurrent lung malignancy  ASSESSMENT Acute hypoxic respiratory failure Acute COPD exacerbation History of lung cancer  with recurrent hilar mass suggestive of recurrent lung malignancy History of breast cancer status post chemo and radiation 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  Will update when available Magdalene S. [facility] ANP-BC Pulmonary and Critical Care Medicine [facility] Pager [phone] or [phone]  NB: This document was prepared using Dragon voice recognition software and may include unintentional dictation errors.    04/21/2018, 7:35 AM

## 2018-04-21 NOTE — ED Notes (Signed)
Critical potassium of 2.5 and troponin of 0.04 called from lab. Dr. Mable Paris notified, order for iv potassium received.

## 2018-04-21 NOTE — ED Notes (Signed)
Pt states she does not feel improved and states tightness "in my lungs" is worse, md notified, order for bipap received, rt sonjay notified.

## 2018-04-21 NOTE — Progress Notes (Signed)
PHARMACIST - PHYSICIAN COMMUNICATION  CONCERNING: IV to Oral Route Change Policy  RECOMMENDATION: This patient is receiving azithromycin by the intravenous route.  Based on criteria approved by the Pharmacy and Therapeutics Committee, the intravenous medication(s) is/are being converted to the equivalent oral dose form(s).   DESCRIPTION: These criteria include:  The patient is eating (either orally or via tube) and/or has been taking other orally administered medications for a least 24 hours  The patient has no evidence of active gastrointestinal bleeding or impaired GI absorption (gastrectomy, short bowel, patient on TNA or NPO).  If you have questions about this conversion, please contact the Pharmacy Department  []   [phone] )  [facility] [x]   [phone] )  [facility] []   [phone] )  [facility] []   [phone] )  [facility] []   [phone] )  [place], [facility] 04/21/2018 4:36 PM

## 2018-04-21 NOTE — Progress Notes (Signed)
Spoke with Dr Mortimer Fries regarding patients continued increase heart rate. MD acknowledged.

## 2018-04-21 NOTE — Progress Notes (Signed)
eLink Physician-Brief Progress Note Patient Name: Danielle [name] DOB: 1956-01-10 MRN: [id number]   Date of Service  04/21/2018  HPI/Events of Note  62 yo female with PMH of COPD and hilar mass. Admitted with AECOPD. Now on BiPAP. Being admitted to stepdown. PCCM asked to assume care. VSS.  eICU Interventions  No new orders.     Intervention Category Evaluation Type: New Patient Evaluation  Danielle [name] 04/21/2018, 6:37 AM

## 2018-04-22 DIAGNOSIS — R531 Weakness: Secondary | ICD-10-CM

## 2018-04-22 DIAGNOSIS — Z7189 Other specified counseling: Secondary | ICD-10-CM

## 2018-04-22 DIAGNOSIS — J9601 Acute respiratory failure with hypoxia: Secondary | ICD-10-CM

## 2018-04-22 LAB — GLUCOSE, CAPILLARY
GLUCOSE-CAPILLARY: 213 mg/dL — AB (ref 70–99)
GLUCOSE-CAPILLARY: 194 mg/dL — AB (ref 70–99)
Glucose-Capillary: 149 mg/dL — ABNORMAL HIGH (ref 70–99)
Glucose-Capillary: 167 mg/dL — ABNORMAL HIGH (ref 70–99)
Glucose-Capillary: 179 mg/dL — ABNORMAL HIGH (ref 70–99)
Glucose-Capillary: 189 mg/dL — ABNORMAL HIGH (ref 70–99)

## 2018-04-22 LAB — PHOSPHORUS: Phosphorus: 1.4 mg/dL — ABNORMAL LOW (ref 2.5–4.6)

## 2018-04-22 LAB — CBC
HEMATOCRIT: 23.9 % — AB (ref 35.0–47.0)
Hemoglobin: 8 g/dL — ABNORMAL LOW (ref 12.0–16.0)
MCH: 34.3 pg — AB (ref 26.0–34.0)
MCHC: 33.5 g/dL (ref 32.0–36.0)
MCV: 102.4 fL — AB (ref 80.0–100.0)
Platelets: 206 10*3/uL (ref 150–440)
RBC: 2.34 MIL/uL — AB (ref 3.80–5.20)
RDW: 15.2 % — ABNORMAL HIGH (ref 11.5–14.5)
WBC: 5.3 10*3/uL (ref 3.6–11.0)

## 2018-04-22 LAB — BASIC METABOLIC PANEL
Anion gap: 12 (ref 5–15)
BUN: 20 mg/dL (ref 8–23)
CHLORIDE: 109 mmol/L (ref 98–111)
CO2: 19 mmol/L — AB (ref 22–32)
Calcium: 6.9 mg/dL — ABNORMAL LOW (ref 8.9–10.3)
Creatinine, Ser: 1.29 mg/dL — ABNORMAL HIGH (ref 0.44–1.00)
GFR calc non Af Amer: 43 mL/min — ABNORMAL LOW (ref >60)
GFR, EST AFRICAN AMERICAN: 50 mL/min — AB (ref >60)
Glucose, Bld: 158 mg/dL — ABNORMAL HIGH (ref 70–99)
POTASSIUM: 3.1 mmol/L — AB (ref 3.5–5.1)
SODIUM: 140 mmol/L (ref 135–145)

## 2018-04-22 LAB — ALBUMIN: Albumin: 3 g/dL — ABNORMAL LOW (ref 3.5–5.0)

## 2018-04-22 LAB — MAGNESIUM: Magnesium: 1.8 mg/dL (ref 1.7–2.4)

## 2018-04-22 LAB — HIV ANTIBODY (ROUTINE TESTING W REFLEX): HIV Screen 4th Generation wRfx: NONREACTIVE

## 2018-04-22 LAB — CALCIUM, IONIZED: Calcium, Ionized, Serum: 3.3 mg/dL — ABNORMAL LOW (ref 4.5–5.6)

## 2018-04-22 MED ORDER — POTASSIUM PHOSPHATES 15 MMOLE/5ML IV SOLN
30.0000 mmol | Freq: Once | INTRAVENOUS | Status: AC
  Administered 2018-04-22: 30 mmol via INTRAVENOUS
  Filled 2018-04-22: qty 10

## 2018-04-22 MED ORDER — IPRATROPIUM-ALBUTEROL 0.5-2.5 (3) MG/3ML IN SOLN
3.0000 mL | RESPIRATORY_TRACT | Status: DC | PRN
  Administered 2018-04-22 – 2018-05-03 (×8): 3 mL via RESPIRATORY_TRACT
  Filled 2018-04-22 (×6): qty 3

## 2018-04-22 MED ORDER — IPRATROPIUM-ALBUTEROL 0.5-2.5 (3) MG/3ML IN SOLN
3.0000 mL | Freq: Four times a day (QID) | RESPIRATORY_TRACT | Status: DC
  Administered 2018-04-22 – 2018-04-27 (×16): 3 mL via RESPIRATORY_TRACT
  Filled 2018-04-22 (×19): qty 3

## 2018-04-22 MED ORDER — APIXABAN 5 MG PO TABS
5.0000 mg | ORAL_TABLET | Freq: Two times a day (BID) | ORAL | Status: DC
  Administered 2018-04-22 – 2018-04-27 (×11): 5 mg via ORAL
  Filled 2018-04-22 (×11): qty 1

## 2018-04-22 MED ORDER — MAGNESIUM SULFATE 2 GM/50ML IV SOLN
2.0000 g | Freq: Once | INTRAVENOUS | Status: AC
  Administered 2018-04-22: 2 g via INTRAVENOUS
  Filled 2018-04-22: qty 50

## 2018-04-22 MED ORDER — BOOST / RESOURCE BREEZE PO LIQD CUSTOM
1.0000 | Freq: Three times a day (TID) | ORAL | Status: DC
  Administered 2018-04-22 – 2018-04-24 (×3): 1 via ORAL

## 2018-04-22 MED ORDER — ADULT MULTIVITAMIN W/MINERALS CH
1.0000 | ORAL_TABLET | Freq: Every day | ORAL | Status: DC
  Administered 2018-04-23 – 2018-05-02 (×10): 1 via ORAL
  Filled 2018-04-22 (×10): qty 1

## 2018-04-22 MED ORDER — DILTIAZEM HCL 30 MG PO TABS
30.0000 mg | ORAL_TABLET | Freq: Four times a day (QID) | ORAL | Status: DC
  Administered 2018-04-22 – 2018-04-23 (×3): 30 mg via ORAL
  Filled 2018-04-22 (×3): qty 1

## 2018-04-22 NOTE — Progress Notes (Signed)
patienty atated she was SOB, sat 99 % on 2l o2 at this time PRN ned tx given

## 2018-04-22 NOTE — Progress Notes (Signed)
Pharmacy Electrolyte Monitoring Consult:  Pharmacy consulted to assist in monitoring and replacing electrolytes in this 62 y.o. female admitted on 04/21/2018 with Shortness of Breath  Patient received furosemide 40mg  IV x 1 on 7/8. Patient has received 158mEq or oral potassium and 66mEq of IV potassium on 7/8.   Labs:  Sodium (mmol/L)  Date Value  04/22/2018 140  02/09/2015 132 (L)   Potassium (mmol/L)  Date Value  04/22/2018 3.1 (L)  02/09/2015 3.8   Magnesium (mg/dL)  Date Value  04/22/2018 1.8  12/07/2013 1.8   Phosphorus (mg/dL)  Date Value  04/22/2018 1.4 (L)   Calcium (mg/dL)  Date Value  04/22/2018 6.9 (L)   Calcium, Total (mg/dL)  Date Value  02/09/2015 9.1   Albumin (g/dL)  Date Value  04/22/2018 3.0 (L)  02/09/2015 4.3   Corrected Calcium: 7.7   Assessment/Plan: Potassium phosphate 30 mmol IV x 1. Magnesium 2g IV X 1.   Will recheck all electrolytes with am labs.   Patient will need to continue to received oral replacement as tolerated.   Pharmacy will continue to monitor and adjust per consult.   Simpson,Michael L 04/22/2018 11:57 AM

## 2018-04-22 NOTE — Progress Notes (Signed)
Patient was transfer to the floor from ICU at this time, patient alert and oriented, denies any at this time, vss, , mood calm, patient on cardizem drip heart rsate 102, sat 99 % on o2 2l [place]

## 2018-04-22 NOTE — Progress Notes (Signed)
CRITICAL CARE NOTE  CC  follow up respiratory failure  SUBJECTIVE Prognosis is guarded Off of biPAP ECHO prelim report shows EF 35% Lasix and biPAP last night CT chest progressive RUL opacities compared to 6 months ago Alert and awake      SIGNIFICANT EVENTS    BP 104/84   Pulse (!) 105   Temp 98 F (36.7 C) (Oral)   Resp 15   Ht 5\' 6"  (1.676 m)   Wt 132 lb 11.5 oz (60.2 kg)   SpO2 97%   BMI 21.42 kg/m    REVIEW OF SYSTEMS  PATIENT IS UNABLE TO PROVIDE COMPLETE REVIEW OF SYSTEM S DUE TO SEVERE CRITICAL ILLNESS AND ENCEPHALOPATHY   PHYSICAL EXAMINATION:  GENERAL:critically ill appearing HEAD: Normocephalic, atraumatic.  EYES: Pupils equal, round, reactive to light.  No scleral icterus.  MOUTH: Moist mucosal membrane. NECK: Supple. No thyromegaly. No nodules. No JVD.  PULMONARY: +rhonchi CARDIOVASCULAR: S1 and S2. Regular rate and rhythm. No murmurs, rubs, or gallops.  GASTROINTESTINAL: Soft, nontender, -distended. No masses. Positive bowel sounds. No hepatosplenomegaly.  MUSCULOSKELETAL: + edema.  NEUROLOGIC:alert and awake SKIN:intact,warm,dry  ASSESSMENT AND PLAN SYNOPSIS   Severe Hypoxic and Hypercapnic Respiratory Failure-resolving -biPAP as needed  CHF systolic-exacerbation Lasix as needed control afib- follow recs by cardiology   Radiation pneumonitis/fibrosis RUL progressive opacities -continue steroids   CARDIAC ICU monitoring  ID -continue IV abx as prescibed -follow up cultures   DVT/GI PRX ordered TRANSFUSIONS AS NEEDED MONITOR FSBS ASSESS the need for LABS as needed  Palliative care consulted Remains STEP DOWN STATUS FOR NOW  Maretta Bees Patricia Pesa, M.D.  [facility]  Medical Director [place] Director [place] Department

## 2018-04-22 NOTE — Consult Note (Signed)
Consultation Note Date: 04/22/2018   Patient Name: Danielle [name]  DOB: 18-May-1956  MRN: [id number]  Age / Sex: 62 y.o., female  PCP: Elisabeth Cara, NP Referring Physician: Demetrios Loll, MD  Reason for Consultation: Establishing goals of care and Psychosocial/spiritual support  HPI/Patient Profile: 62 y.o. female   admitted on 04/21/2018 with PMH  of HTN, COPD (2L [place] O2 PRN), known R hilar mass, Hx breast Ca 9 has seen Dr Rogue Bussing 09/2017, acute on chronic hypoxemic respiratory failure/acute COPD exacerbation.   Admitted through the emergency room with complaints of wheezing and shortness of breath. Started on BiPAP at that time.   Patient faces treatment option decisions, advanced directive decisions and anticipatory care needs.   Clinical Assessment and Goals of Care:   This NP Wadie Lessen reviewed medical records, received report from team, assessed the patient and then meet at the patient's bedside  to discuss diagnosis, prognosis, GOC,  and options.  Concept of  Palliative Care was discussed  A  discussion was had today regarding advanced directives.  Concept specific to code status was had.   Values and goals of care important to patient and family were attempted to be elicited.  I placed a call from the patient's room to her daughter Delphia Grates and offered a family meeting with her and her mother and other siblings if interested.  Daughter was given my cell phone number, she told me she would call me back this afternoon to schedule a meeting but I never received a call back.  Patient has capacity and is making her own decisions at this time I worry that she has limited support at home.   Questions and concerns addressed.   Patient  encouraged to call with questions or concerns.    PMT will continue to support holistically while here in the hospital.     No docuemtned HPOA, offered to have  spiritual care assist her in securing these documents but she is declined at this time.    SUMMARY OF RECOMMENDATIONS    Code Status/Advance Care Planning:  Full code    Additional Recommendations (Limitations, Scope, Preferences):  Full Scope Treatment  Psycho-social/Spiritual:   Desire for further Chaplaincy support:yes   Prognosis:   Unable to determine  Discharge Planning: To Be Determined      Primary Diagnoses: Present on Admission: . Acute hypoxemic respiratory failure ([place])   I have reviewed the medical record, interviewed the patient and family, and examined the patient. The following aspects are pertinent.  Past Medical History:  Diagnosis Date  . Anxiety   . Arthritis   . Cancer ([place]) left    breast cancer 2000, chemo tx's with total mastectomy and lymph nodes resected.   . Cancer of right lung ([place]) 02/21/2016   rad tx's.   [name] COPD (chronic obstructive pulmonary disease) ([place])   . Dependence on supplemental oxygen   . Depression   . Heart murmur   . Hypertension   . Lung nodule   . Lymphedema   .  Shortness of breath dyspnea    with exertion  . Status post chemotherapy 2001   left breast cancer  . Status post radiation therapy 2001   left breast cancer   Social History   Socioeconomic History  . Marital status: Divorced    Spouse name: Not on file  . Number of children: Not on file  . Years of education: Not on file  . Highest education level: Not on file  Occupational History  . Not on file  Social Needs  . Financial resource strain: Not on file  . Food insecurity:    Worry: Not on file    Inability: Not on file  . Transportation needs:    Medical: Not on file    Non-medical: Not on file  Tobacco Use  . Smoking status: Former Smoker    Packs/day: 0.50    Years: 20.00    Pack years: 10.00    Types: Cigarettes    Last attempt to quit: 07/02/2012    Years since quitting: 5.8  . Smokeless tobacco: Never Used  . Tobacco comment:  quit 2014  Substance and Sexual Activity  . Alcohol use: Yes    Comment: Occasionally  . Drug use: No  . Sexual activity: Never  Lifestyle  . Physical activity:    Days per week: Not on file    Minutes per session: Not on file  . Stress: Not on file  Relationships  . Social connections:    Talks on phone: Not on file    Gets together: Not on file    Attends religious service: Not on file    Active member of club or organization: Not on file    Attends meetings of clubs or organizations: Not on file    Relationship status: Not on file  Other Topics Concern  . Not on file  Social History Narrative  . Not on file   Family History  Problem Relation Age of Onset  . Breast cancer Mother 38  . Cancer Mother        Breast   . Cirrhosis Father   . Breast cancer Paternal Aunt 7  . Cancer Maternal Aunt        Breast    Scheduled Meds: . amLODipine  10 mg Oral Daily  . azithromycin  500 mg Oral Daily  . citalopram  20 mg Oral Daily  . feeding supplement  1 Container Oral TID BM  . heparin  5,000 Units Subcutaneous Q8H  . insulin aspart  0-15 Units Subcutaneous Q4H  . ipratropium-albuterol  3 mL Nebulization Q4H  . [START ON 04/23/2018] mometasone-formoterol  2 puff Inhalation BID  . [START ON 04/23/2018] multivitamin with minerals  1 tablet Oral Daily  . predniSONE  40 mg Oral Q breakfast   Continuous Infusions: . diltiazem (CARDIZEM) infusion 5 mg/hr (04/22/18 1200)  . magnesium sulfate 1 - 4 g bolus IVPB     PRN Meds:.acetaminophen **OR** acetaminophen, bisacodyl, dextrose, HYDROcodone-acetaminophen, ondansetron **OR** ondansetron (ZOFRAN) IV, senna-docusate Medications Prior to Admission:  Prior to Admission medications   Medication Sig Start Date End Date Taking? Authorizing Provider  citalopram (CELEXA) 40 MG tablet Take 40 mg by mouth daily.    Yes [provider]  losartan (COZAAR) 100 MG tablet Take 1 tablet (100 mg total) by mouth daily. 02/12/17  Yes  [name], MD  celecoxib (CELEBREX) 200 MG capsule Take 1 capsule (200 mg total) by mouth daily. Patient not taking: Reported on 04/21/2018 07/23/15  Menshew, Dannielle Karvonen, PA-C  metoprolol succinate (TOPROL-XL) 25 MG 24 hr tablet Take 25 mg by mouth daily. 10/23/17 10/23/18  [provider]  potassium chloride SA (K-DUR,KLOR-CON) 20 MEQ tablet Take 1 tablet (20 mEq total) by mouth daily. Patient not taking: Reported on 04/21/2018 11/14/15   Forest Gleason, MD   No Known Allergies Review of Systems  Constitutional: Positive for fatigue.  Respiratory: Positive for shortness of breath.   Gastrointestinal: Positive for abdominal distention.  Neurological: Positive for weakness.    Physical Exam  Constitutional: She appears ill.  Cardiovascular: Tachycardia present.  Pulmonary/Chest: She has decreased breath sounds.  Abdominal: There is tenderness.  Neurological: She is alert.  Skin: Skin is warm and dry.    Vital Signs: BP 102/76 (BP Location: Right Leg)   Pulse (!) 107   Temp (!) 97.4 F (36.3 C) (Oral)   Resp (!) 33   Ht 5\' 6"  (1.676 m)   Wt 64 kg (141 lb 3.2 oz)   SpO2 99%   BMI 22.79 kg/m  Pain Scale: 0-10   Pain Score: 0-No pain   SpO2: SpO2: 99 % O2 Device:SpO2: 99 % O2 Flow Rate: .O2 Flow Rate (L/min): 2 L/min  IO: Intake/output summary:   Intake/Output Summary (Last 24 hours) at 04/22/2018 1344 Last data filed at 04/22/2018 1200 Gross per 24 hour  Intake 564.29 ml  Output 1000 ml  Net -435.71 ml    LBM:   Baseline Weight: Weight: 63 kg (139 lb) Most recent weight: Weight: 64 kg (141 lb 3.2 oz)      Palliative Assessment/Data: 40 % at this time   Discussed with Dr Bridgett Larsson  Time In: 1245 Time Out: 1400 Time Total: 75 minutes Greater than 50%  of this time was spent counseling and coordinating care related to the above assessment and plan.  Signed by: Wadie Lessen, NP   Please contact Palliative Medicine Team phone at [phone] for  questions and concerns.  For individual provider: See Shea Evans

## 2018-04-22 NOTE — Progress Notes (Addendum)
[facility] Cardiology  SUBJECTIVE: Ms. Karpel is a 62 year old female with a history of COPD and breast cancer who presented to the ED on 04/21/18 with shortness of breath and new onset atrial fibrillation. She is currently not experiencing any chest pain, palpitations, or lower extremity edema, but has ongoing shortness of breath and abdominal "pressure." She denies N/V/D, but has not had a bowel movement since admission. Rate is in better control on iv cardizem, No absolute contraindication for anticoagulation. If no surgical procedures are planned would consider anticoagulation.   Ms. Sirmon is followed by Dr. [name] and Etta Quill at [facility].    Vitals:   04/22/18 0500 04/22/18 0600 04/22/18 0700 04/22/18 0800  BP: 100/80 104/84 98/84 93/65   Pulse: (!) 117 (!) 105 88 (!) 142  Resp: 13 15 16 17   Temp:    97.8 F (36.6 C)  TempSrc:    Oral  SpO2: 100% 97% 100% 98%  Weight:      Height:         Intake/Output Summary (Last 24 hours) at 04/22/2018 1024 Last data filed at 04/22/2018 0800 Gross per 24 hour  Intake 204.29 ml  Output 1000 ml  Net -795.71 ml      PHYSICAL EXAM  General: Well developed, well nourished, in no acute distress HEENT:  Normocephalic and atramatic Neck:  No JVD.  Lungs: Off bipap, positive rhonchi bilaterally  Heart: Irregular rate, irregular rhythm, consistent with a-fib Abdomen: Bowel sounds are positive, abdomen soft and mildly tender  Msk:  Back normal. Normal strength and tone for age. Extremities: No clubbing or cyanosis.  1+ edema bilaterally  Neuro: Alert and oriented X 3. Psych:  Good affect, responds appropriately   LABS: Basic Metabolic Panel: Recent Labs    04/21/18 0358 04/21/18 0641 04/21/18 1822 04/22/18 0511  NA 142  --   --  140  K 2.5*  --  3.0* 3.1*  CL 106  --   --  109  CO2 15*  --   --  19*  GLUCOSE 61*  --   --  158*  BUN 16  --   --  20  CREATININE 1.51*  --   --  1.29*  CALCIUM 6.9*  --   --  6.9*  MG  --  1.9   --  1.8  PHOS  --  4.7*  --  1.4*   Liver Function Tests: Recent Labs    04/21/18 0358 04/22/18 0511  AST 182*  --   ALT 100*  --   ALKPHOS 102  --   BILITOT 4.2*  --   PROT 7.5  --   ALBUMIN 3.4* 3.0*   No results for input(s): LIPASE, AMYLASE in the last 72 hours. CBC: Recent Labs    04/21/18 0358 04/22/18 0511  WBC 7.9 5.3  NEUTROABS 4.7  --   HGB 9.3* 8.0*  HCT 27.2* 23.9*  MCV 103.3* 102.4*  PLT 229 206   Cardiac Enzymes: Recent Labs    04/21/18 0358 04/21/18 0641 04/21/18 0939  TROPONINI 0.04* 0.04* 0.04*   BNP: Invalid input(s): POCBNP D-Dimer: No results for input(s): DDIMER in the last 72 hours. Hemoglobin A1C: No results for input(s): HGBA1C in the last 72 hours. Fasting Lipid Panel: No results for input(s): CHOL, HDL, LDLCALC, TRIG, CHOLHDL, LDLDIRECT in the last 72 hours. Thyroid Function Tests: No results for input(s): TSH, T4TOTAL, T3FREE, THYROIDAB in the last 72 hours.  Invalid input(s): FREET3 Anemia Panel: Recent Labs  04/21/18 0641  [id number] 2,668*  FOLATE 9.4    Ct Chest Wo Contrast  Result Date: 04/21/2018 CLINICAL DATA:  62 year old female with a history of abnormal chest x-ray and prior known right-sided lung carcinoma status post treatment EXAM: CT CHEST WITHOUT CONTRAST TECHNIQUE: Multidetector CT imaging of the chest was performed following the standard protocol without IV contrast. COMPARISON:  Chest x-ray today's date, prior CT 09/12/2017, 03/01/2017, PET-CT 07/13/2016 FINDINGS: Cardiovascular: Cardiac size unchanged. No pericardial fluid/thickening. Dense calcifications of left main, left anterior descending, circumflex, right coronary arteries. Calcifications of the aortic valve. Calcifications of the transverse arch and descending thoracic aorta. No aneurysm. Incidental calcifications of the suprarenal aorta of the upper abdomen. Diameter of the main pulmonary artery unremarkable. Mediastinum/Nodes: Small lymph nodes of the  mediastinum, including paratracheal nodes unchanged in number from the comparison CT. Unchanged subcarinal lymph node. Lungs/Pleura: The abnormality on today's chest x-ray corresponds 2 consolidation/architectural distortion and confluent opacity of the right upper lobe which is more pronounced than the comparison CT of 09/12/2017. Volume loss with linear and nodular opacity of the right upper lobe, extends along the peribronchovascular Cher of right upper lobe bronchi to the pleura anteriorly and medially. Nodular changes are inseparable from the hilar structures with volume loss and contraction of the proximal airways of the right upper lobe. Bronchus intermedius and the middle lobe bronchi remain patent. Right lower lobe airways remain patent. No confluent airspace disease. Diameter of the nodularity on image 47 measures 3.5 cm x 2.9 cm, and more inferiorly measures 4.8 cm x 3.5 cm. All of these changes are in the treated area from the prior CT. Left lung demonstrates similar emphysematous changes and scarring at the apex. No new nodule/mass identified on the left. No confluent airspace disease. Small right greater than left pleural effusions, new from the prior. Upper Abdomen: Steatosis. Musculoskeletal: No acute displaced fracture. Degenerative changes of the spine. No new aggressive bony lesion identified. IMPRESSION: The abnormality of this chest x-ray corresponds to nodular/consolidative changes of the right upper lobe with architectural distortion in the area of prior therapy. 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 Correlation with the patient's prior treatment may be useful, as well as potential future imaging with contrast-enhanced CT chest or alternatively PET-CT. Small lymph nodes of the mediastinum, without increasing lymphadenopathy. Small right greater  than left pleural effusion. Emphysema.  Emphysema (ICD10-J43.9). Aortic atherosclerosis with left main and 3 vessel coronary artery disease. Aortic Atherosclerosis (ICD10-I70.0). Hepatic steatosis. Electronically Signed   By: Corrie Mckusick D.O.   On: 04/21/2018 17:28   US Venous Img Lower Bilateral  Result Date: 04/21/2018 CLINICAL DATA:  Bilateral lower extremity pain and edema. History of fall 2 weeks ago. History of breast cancer. Evaluate for DVT. EXAM: BILATERAL LOWER EXTREMITY VENOUS DOPPLER ULTRASOUND TECHNIQUE: Gray-scale sonography with graded compression, as well as color Doppler and duplex ultrasound were performed to evaluate the lower extremity deep venous systems from the level of the common femoral vein and including the common femoral, femoral, profunda femoral, popliteal and calf veins including the posterior tibial, peroneal and gastrocnemius veins when visible. The superficial great saphenous vein was also interrogated. Spectral Doppler was utilized to evaluate flow at rest and with distal augmentation maneuvers in the common femoral, femoral and popliteal veins. COMPARISON:  None. FINDINGS: RIGHT LOWER EXTREMITY Common Femoral Vein: No evidence of thrombus. Normal compressibility, respiratory phasicity and  response to augmentation. Saphenofemoral Junction: No evidence of thrombus. Normal compressibility and flow on color Doppler imaging. Profunda Femoral Vein: No evidence of thrombus. Normal compressibility and flow on color Doppler imaging. Femoral Vein: No evidence of thrombus. Normal compressibility, respiratory phasicity and response to augmentation. Popliteal Vein: No evidence of thrombus. Normal compressibility, respiratory phasicity and response to augmentation. Calf Veins: No evidence of thrombus. Normal compressibility and flow on color Doppler imaging. Superficial Great Saphenous Vein: No evidence of thrombus. Normal compressibility. Venous Reflux:  None. Other Findings:  None. LEFT  LOWER EXTREMITY Common Femoral Vein: No evidence of thrombus. Normal compressibility, respiratory phasicity and response to augmentation. Saphenofemoral Junction: No evidence of thrombus. Normal compressibility and flow on color Doppler imaging. Profunda Femoral Vein: No evidence of thrombus. Normal compressibility and flow on color Doppler imaging. Femoral Vein: No evidence of thrombus. Normal compressibility, respiratory phasicity and response to augmentation. Popliteal Vein: No evidence of thrombus. Normal compressibility, respiratory phasicity and response to augmentation. Calf Veins: No evidence of thrombus. Normal compressibility and flow on color Doppler imaging. Superficial Great Saphenous Vein: No evidence of thrombus. Normal compressibility. Venous Reflux:  None. Other Findings:  None. IMPRESSION: No evidence of DVT within either lower extremity. Electronically Signed   By: Sandi Mariscal M.D.   On: 04/21/2018 14:13   Dg Chest Port 1 View  Result Date: 04/21/2018 CLINICAL DATA:  Initial evaluation for acute shortness of breath, history of COPD. EXAM: PORTABLE CHEST 1 VIEW COMPARISON:  Prior radiograph from 04/19/2017 as well as prior CT from 09/12/2017. FINDINGS: Mild cardiomegaly, stable. Aortic atherosclerosis. Increased masslike density at the right hilum measuring approximately 3.7 x 3.5 cm. Adjacent right perihilar scarring and architectural distortion. No other focal infiltrates. No pleural effusion or pulmonary edema. Chronic bronchitic changes. No pneumothorax. No acute osseous abnormality. IMPRESSION: 1. Increased prominence of 3.5 cm mass like opacity at the right hilum, concerning for worsened malignancy. Further assessment with cross-sectional imaging of the chest suggested for further characterization. 2. Underlying chronic emphysematous changes. 3. Cardiomegaly with aortic atherosclerosis. Electronically Signed   By: Jeannine Boga M.D.   On: 04/21/2018 04:54     Echo: LV EF  moderately reduced at 35-40%. Mild AS, moderate MR.   TELEMETRY: Atrial fibrillation with rate between 105-115  ASSESSMENT AND PLAN:  Active Problems:   Acute hypoxemic respiratory failure (HCC)    1. New onset atrial fibrillation   -Discontinue iv cardizem and convert to cardizem 30 q 6  - Will start anticoagulation with eliquis 5 mg bid.   - Follow up outpatient cardiology for Holter monitor, further evaluation  2. Elevated troponin (.04)  - Likely due to underlying COPD, hypoxia   - No ischemia changes noted on ECG 3. Acute Hypoxic Respiratory Failure   - BiPap as needed  - Agree with current hopsitalist plan  4. Hypokalemia (3.0, 3.1)   - Continue monitoring; goal to a level greater than 4   The history, physical exam findings, and plan of care were discussed with Dr. Bartholome Bill and all decisions were made in collaboration.    Avie Arenas, PA-C 04/22/2018 10:24 AM

## 2018-04-22 NOTE — Progress Notes (Signed)
Patient transported to room 252 via wheelchair on portable telemetry and O2 @ 2 lpm by this RN.

## 2018-04-22 NOTE — Progress Notes (Signed)
CH responded to OR, presented to the patient's room, where she is under contact precautions. I knocked on the door, Let patient know that I am the [facility], and was requested to come see her, she endorses wanting to see a CH, she was admitted due to SOB and was found to have an exacerbation of her COPD. I gave her words of encouragement, prayer was offered and accepted, prayer ensued and salutations of blessings and healing were conferred upon the her, and she was encouraged to remain, hopeful pursuant to her health.

## 2018-04-22 NOTE — Progress Notes (Signed)
Initial Nutrition Assessment  DOCUMENTATION CODES:   Non-severe (moderate) malnutrition in context of chronic illness  INTERVENTION:  Provide Boost Breeze po TID, each supplement provides 250 kcal and 9 grams of protein.  Provide daily MVI.  Encouraged intake of small, frequent meals/snacks throughout the day. Encouraged patient to choose a good source of protein at each meal/snack.  NUTRITION DIAGNOSIS:   Moderate Malnutrition related to chronic illness(hx breast cancer, CHF, COPD) as evidenced by mild fat depletion, moderate fat depletion, mild muscle depletion, moderate muscle depletion.  GOAL:   Patient will meet greater than or equal to 90% of their needs  MONITOR:   PO intake, Supplement acceptance, Labs, Weight trends, I & O's  REASON FOR ASSESSMENT:   Malnutrition Screening Tool, Consult Assessment of nutrition requirement/status  ASSESSMENT:   62 year old female with PMHx of arthritis, HTN, COPD, anxiety, depression, CHF, breast cancer s/p left mastectomy and chemotherapy/XRT, lymphedema who is now admitted with severe hypoxic and hypercapnic respiratory failure initially requiring BiPAP, CHF exacerbation, radiation pneumonitis/fibrosis.   Met with patient at bedside. She used BiPAP overnight but is currently on nasal cannula. She reports she has had a poor appetite for a while now. She typically only eats one meal per day. She has eggs or a bagel in the morning. She occasionally snacks but not regularly. She has previously tried Ensure before but reports it gave her constipation. Her MD suggested she try the clear ONS. She is amenable to trying [employer] here.  Patient reports she is unsure of her UBW or weight trend but she feels like she is losing weight. Per chart patient was 148 lbs on 08/24/2016. She has lost 16 lbs (10.8% body weight) over >1 year, which is not significant for time frame.  Medications reviewed and include: azithromycin, Novolog 0-15 units  Q4hrs, prednisone 40 mg daily with breakfast, diltiazem gtt, magnesium sulfate 2 grams IV once today, potassium phosphate 30 mmol IV once today.  Labs reviewed: CBG 146-263, Potassium 3.1, CO2 19, Creatinine 1.29, Phosphorus 1.4.  Discussed with RN and on rounds.  NUTRITION - FOCUSED PHYSICAL EXAM:    Most Recent Value  Orbital Region  Moderate depletion  Upper Arm Region  Moderate depletion [on right side,  left side with lymphedema]  Thoracic and Lumbar Region  Mild depletion  Buccal Region  Moderate depletion  Temple Region  Moderate depletion  Clavicle Bone Region  Mild depletion  Clavicle and Acromion Bone Region  Mild depletion  Scapular Bone Region  Moderate depletion  Dorsal Hand  Moderate depletion  Patellar Region  Mild depletion  Anterior Thigh Region  Mild depletion  Posterior Calf Region  Mild depletion  Edema (RD Assessment)  None  Hair  Unable to assess  Eyes  Reviewed  Mouth  Unable to assess  Skin  Reviewed  Nails  Reviewed     Diet Order:   Diet Order           Diet Heart Room service appropriate? Yes; Fluid consistency: Thin  Diet effective now          EDUCATION NEEDS:   Education needs have been addressed  Skin:  Skin Assessment: Reviewed RN Assessment  Last BM:  Unknown/PTA  Height:   Ht Readings from Last 1 Encounters:  04/21/18 '5\' 6"'$  (1.676 m)    Weight:   Wt Readings from Last 1 Encounters:  04/21/18 132 lb 11.5 oz (60.2 kg)    Ideal Body Weight:  59.1 kg  BMI:  Body mass index is 21.42 kg/m.  Estimated Nutritional Needs:   Kcal:  1505-1805 (25-30 kcal/kg)  Protein:  70-85 grams (1.2-1.4 grams/kg)  Fluid:  1.5-1.8 L/day (1 mL/kcal)  Willey Blade, MS, RD, LDN Office: [phone] Pager: [phone] After Hours/Weekend Pager: [phone]

## 2018-04-22 NOTE — Progress Notes (Signed)
[facility] [place] NAME: Danielle [name]    MR#:  [id number]  DATE OF BIRTH:  07-18-1956  SUBJECTIVE:  CHIEF COMPLAINT:   Chief Complaint  Patient presents with  . Shortness of Breath   Better shortness of breath, off BiPAP on oxygen 2 L.  She developed A. fib and on Cardizem drip.  Heart rate is about 100-110. REVIEW OF SYSTEMS:  Review of Systems  Constitutional: Positive for malaise/fatigue. Negative for chills and fever.  HENT: Negative for sore throat.   Eyes: Negative for blurred vision and double vision.  Respiratory: Positive for cough and shortness of breath. Negative for hemoptysis, wheezing and stridor.   Cardiovascular: Negative for chest pain, palpitations, orthopnea and leg swelling.       Bilateral ankle swelling  Gastrointestinal: Negative for abdominal pain, blood in stool, diarrhea, melena, nausea and vomiting.  Genitourinary: Negative for dysuria, flank pain and hematuria.  Musculoskeletal: Negative for back pain and joint pain.  Skin: Negative for rash.  Neurological: Negative for dizziness, sensory change, focal weakness, seizures, loss of consciousness, weakness and headaches.  Endo/Heme/Allergies: Negative for polydipsia.  Psychiatric/Behavioral: Negative for depression. The patient is not nervous/anxious.     DRUG ALLERGIES:  No Known Allergies VITALS:  Blood pressure 131/84, pulse (!) 123, temperature 97.8 F (36.6 C), temperature source Oral, resp. rate (!) 33, height 5\' 6"  (1.676 m), weight 132 lb 11.5 oz (60.2 kg), SpO2 96 %. PHYSICAL EXAMINATION:  Physical Exam  Constitutional: She is oriented to person, place, and time. She appears well-developed.  HENT:  Head: Normocephalic.  Mouth/Throat: Oropharynx is clear and moist.  Eyes: Pupils are equal, round, and reactive to light. Conjunctivae and EOM are normal. No scleral icterus.  Neck: Normal range of motion. Neck supple. No JVD present. No tracheal  deviation present.  Cardiovascular: Normal heart sounds. Exam reveals no gallop.  No murmur heard. Tachycardia and irregular rhythm  Pulmonary/Chest: Effort normal. No respiratory distress. She has no wheezes. She has no rales.  Diminished lung sounds  Abdominal: Soft. Bowel sounds are normal. She exhibits no distension. There is no tenderness. There is no rebound.  Musculoskeletal: Normal range of motion. She exhibits no edema or tenderness.  Neurological: She is alert and oriented to person, place, and time. No cranial nerve deficit.  Skin: No rash noted. No erythema.  Psychiatric: She has a normal mood and affect.   LABORATORY PANEL:  Female CBC Recent Labs  Lab 04/22/18 0511  WBC 5.3  HGB 8.0*  HCT 23.9*  PLT 206   ------------------------------------------------------------------------------------------------------------------ Chemistries  Recent Labs  Lab 04/21/18 0358  04/22/18 0511  NA 142  --  140  K 2.5*   < > 3.1*  CL 106  --  109  CO2 15*  --  19*  GLUCOSE 61*  --  158*  BUN 16  --  20  CREATININE 1.51*  --  1.29*  CALCIUM 6.9*  --  6.9*  MG  --    < > 1.8  AST 182*  --   --   ALT 100*  --   --   ALKPHOS 102  --   --   BILITOT 4.2*  --   --    < > = values in this interval not displayed.   RADIOLOGY:  Ct Chest Wo Contrast  Result Date: 04/21/2018 CLINICAL DATA:  62 year old female with a history of abnormal chest x-ray and prior known right-sided lung carcinoma  status post treatment EXAM: CT CHEST WITHOUT CONTRAST TECHNIQUE: Multidetector CT imaging of the chest was performed following the standard protocol without IV contrast. COMPARISON:  Chest x-ray today's date, prior CT 09/12/2017, 03/01/2017, PET-CT 07/13/2016 FINDINGS: Cardiovascular: Cardiac size unchanged. No pericardial fluid/thickening. Dense calcifications of left main, left anterior descending, circumflex, right coronary arteries. Calcifications of the aortic valve. Calcifications of the transverse  arch and descending thoracic aorta. No aneurysm. Incidental calcifications of the suprarenal aorta of the upper abdomen. Diameter of the main pulmonary artery unremarkable. Mediastinum/Nodes: Small lymph nodes of the mediastinum, including paratracheal nodes unchanged in number from the comparison CT. Unchanged subcarinal lymph node. Lungs/Pleura: The abnormality on today's chest x-ray corresponds 2 consolidation/architectural distortion and confluent opacity of the right upper lobe which is more pronounced than the comparison CT of 09/12/2017. Volume loss with linear and nodular opacity of the right upper lobe, extends along the peribronchovascular Cher of right upper lobe bronchi to the pleura anteriorly and medially. Nodular changes are inseparable from the hilar structures with volume loss and contraction of the proximal airways of the right upper lobe. Bronchus intermedius and the middle lobe bronchi remain patent. Right lower lobe airways remain patent. No confluent airspace disease. Diameter of the nodularity on image 47 measures 3.5 cm x 2.9 cm, and more inferiorly measures 4.8 cm x 3.5 cm. All of these changes are in the treated area from the prior CT. Left lung demonstrates similar emphysematous changes and scarring at the apex. No new nodule/mass identified on the left. No confluent airspace disease. Small right greater than left pleural effusions, new from the prior. Upper Abdomen: Steatosis. Musculoskeletal: No acute displaced fracture. Degenerative changes of the spine. No new aggressive bony lesion identified. IMPRESSION: The abnormality of this chest x-ray corresponds to nodular/consolidative changes of the right upper lobe with architectural distortion in the area of prior therapy. 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 Correlation with the patient's  prior treatment may be useful, as well as potential future imaging with contrast-enhanced CT chest or alternatively PET-CT. Small lymph nodes of the mediastinum, without increasing lymphadenopathy. Small right greater than left pleural effusion. Emphysema.  Emphysema (ICD10-J43.9). Aortic atherosclerosis with left main and 3 vessel coronary artery disease. Aortic Atherosclerosis (ICD10-I70.0). Hepatic steatosis. Electronically Signed   By: Corrie Mckusick D.O.   On: 04/21/2018 17:28   US Venous Img Lower Bilateral  Result Date: 04/21/2018 CLINICAL DATA:  Bilateral lower extremity pain and edema. History of fall 2 weeks ago. History of breast cancer. Evaluate for DVT. EXAM: BILATERAL LOWER EXTREMITY VENOUS DOPPLER ULTRASOUND TECHNIQUE: Gray-scale sonography with graded compression, as well as color Doppler and duplex ultrasound were performed to evaluate the lower extremity deep venous systems from the level of the common femoral vein and including the common femoral, femoral, profunda femoral, popliteal and calf veins including the posterior tibial, peroneal and gastrocnemius veins when visible. The superficial great saphenous vein was also interrogated. Spectral Doppler was utilized to evaluate flow at rest and with distal augmentation maneuvers in the common femoral, femoral and popliteal veins. COMPARISON:  None. FINDINGS: RIGHT LOWER EXTREMITY Common Femoral Vein: No evidence of thrombus. Normal compressibility, respiratory phasicity and response to augmentation. Saphenofemoral Junction: No evidence of thrombus. Normal compressibility and flow on color Doppler imaging. Profunda Femoral Vein: No evidence of thrombus. Normal compressibility and flow on color Doppler imaging. Femoral Vein: No evidence of  thrombus. Normal compressibility, respiratory phasicity and response to augmentation. Popliteal Vein: No evidence of thrombus. Normal compressibility, respiratory phasicity and response to augmentation. Calf  Veins: No evidence of thrombus. Normal compressibility and flow on color Doppler imaging. Superficial Great Saphenous Vein: No evidence of thrombus. Normal compressibility. Venous Reflux:  None. Other Findings:  None. LEFT LOWER EXTREMITY Common Femoral Vein: No evidence of thrombus. Normal compressibility, respiratory phasicity and response to augmentation. Saphenofemoral Junction: No evidence of thrombus. Normal compressibility and flow on color Doppler imaging. Profunda Femoral Vein: No evidence of thrombus. Normal compressibility and flow on color Doppler imaging. Femoral Vein: No evidence of thrombus. Normal compressibility, respiratory phasicity and response to augmentation. Popliteal Vein: No evidence of thrombus. Normal compressibility, respiratory phasicity and response to augmentation. Calf Veins: No evidence of thrombus. Normal compressibility and flow on color Doppler imaging. Superficial Great Saphenous Vein: No evidence of thrombus. Normal compressibility. Venous Reflux:  None. Other Findings:  None. IMPRESSION: No evidence of DVT within either lower extremity. Electronically Signed   By: Sandi Mariscal M.D.   On: 04/21/2018 14:13   ASSESSMENT AND PLAN:   89F acute hypoxemic respiratory failure, acute COPD exacerbation, dehydration, hypokalemia, hypoglycemia, renal failure, hypocalcemia, hypoalbuminemia, transaminasemia/hyperbilirubinemia, BNP elevation, Troponin-I elevation, macrocytic anemia.  Acute on chronic respiratory failure with hypoxia and hypercapnia due to COPD exacerbation, radiation pneumonitis/fibrosis and CHF. The patient is off BiPAP, on oxygen by nasal cannula 2L (home O2). Continue nebulizer and DuoNeb as needed, continue Zithromax, discontinue IV Solu-Medrol, changed to prednisone.  New onset A. fib with RVR.  The patient was treated with Cardizem IV without improvement.  Cardizem drip was started.  Consider anticoagulation for stroke prevention per Dr. Ubaldo Glassing.  Echocardiogram  showed ejection fraction 35 to 40%.  Elevated troponin due to demanding ischemia.   Chronic systolic CHF LV EF: 65% -   40% Lasix as needed.  Hypokalemia.  Continue potassium supplement and follow-up level.  Acute renal failure due to ATN.  Improved.  Moderate Malnutrition related to chronic illness.  Follow dietitian's recommendation.   All the records are reviewed and case discussed with Care Management/Social Worker. Management plans discussed with the patient, family and they are in agreement.   CODE STATUS: Full Code  TOTAL TIME TAKING CARE OF THIS PATIENT: 36 minutes.   More than 50% of the time was spent in counseling/coordination of care: YES  POSSIBLE D/C IN 3 DAYS, DEPENDING ON CLINICAL CONDITION.   Demetrios Loll M.D on 04/22/2018 at 1:11 PM  Between 7am to 6pm - Pager - [phone]  After 6pm go to [email or web address] - [facility]

## 2018-04-23 ENCOUNTER — Inpatient Hospital Stay: Payer: Medicaid Other

## 2018-04-23 DIAGNOSIS — Z803 Family history of malignant neoplasm of breast: Secondary | ICD-10-CM

## 2018-04-23 DIAGNOSIS — R630 Anorexia: Secondary | ICD-10-CM

## 2018-04-23 DIAGNOSIS — Z515 Encounter for palliative care: Secondary | ICD-10-CM

## 2018-04-23 DIAGNOSIS — K5900 Constipation, unspecified: Secondary | ICD-10-CM

## 2018-04-23 DIAGNOSIS — B86 Scabies: Secondary | ICD-10-CM

## 2018-04-23 DIAGNOSIS — F419 Anxiety disorder, unspecified: Secondary | ICD-10-CM

## 2018-04-23 DIAGNOSIS — Z923 Personal history of irradiation: Secondary | ICD-10-CM

## 2018-04-23 DIAGNOSIS — J449 Chronic obstructive pulmonary disease, unspecified: Secondary | ICD-10-CM

## 2018-04-23 DIAGNOSIS — J9620 Acute and chronic respiratory failure, unspecified whether with hypoxia or hypercapnia: Secondary | ICD-10-CM

## 2018-04-23 DIAGNOSIS — Z87891 Personal history of nicotine dependence: Secondary | ICD-10-CM

## 2018-04-23 DIAGNOSIS — Z79899 Other long term (current) drug therapy: Secondary | ICD-10-CM

## 2018-04-23 DIAGNOSIS — R1011 Right upper quadrant pain: Secondary | ICD-10-CM

## 2018-04-23 DIAGNOSIS — D649 Anemia, unspecified: Secondary | ICD-10-CM

## 2018-04-23 DIAGNOSIS — R918 Other nonspecific abnormal finding of lung field: Secondary | ICD-10-CM

## 2018-04-23 DIAGNOSIS — M129 Arthropathy, unspecified: Secondary | ICD-10-CM

## 2018-04-23 DIAGNOSIS — Z9981 Dependence on supplemental oxygen: Secondary | ICD-10-CM

## 2018-04-23 DIAGNOSIS — Z853 Personal history of malignant neoplasm of breast: Secondary | ICD-10-CM

## 2018-04-23 DIAGNOSIS — E440 Moderate protein-calorie malnutrition: Secondary | ICD-10-CM

## 2018-04-23 DIAGNOSIS — Z7189 Other specified counseling: Secondary | ICD-10-CM

## 2018-04-23 DIAGNOSIS — C342 Malignant neoplasm of middle lobe, bronchus or lung: Secondary | ICD-10-CM

## 2018-04-23 DIAGNOSIS — Z7901 Long term (current) use of anticoagulants: Secondary | ICD-10-CM

## 2018-04-23 DIAGNOSIS — R011 Cardiac murmur, unspecified: Secondary | ICD-10-CM

## 2018-04-23 DIAGNOSIS — I10 Essential (primary) hypertension: Secondary | ICD-10-CM

## 2018-04-23 DIAGNOSIS — I890 Lymphedema, not elsewhere classified: Secondary | ICD-10-CM

## 2018-04-23 DIAGNOSIS — Z9221 Personal history of antineoplastic chemotherapy: Secondary | ICD-10-CM

## 2018-04-23 DIAGNOSIS — R531 Weakness: Secondary | ICD-10-CM

## 2018-04-23 LAB — MAGNESIUM: Magnesium: 2.2 mg/dL (ref 1.7–2.4)

## 2018-04-23 LAB — BASIC METABOLIC PANEL
ANION GAP: 11 (ref 5–15)
BUN: 31 mg/dL — ABNORMAL HIGH (ref 8–23)
CO2: 20 mmol/L — AB (ref 22–32)
Calcium: 6.8 mg/dL — ABNORMAL LOW (ref 8.9–10.3)
Chloride: 102 mmol/L (ref 98–111)
Creatinine, Ser: 1.54 mg/dL — ABNORMAL HIGH (ref 0.44–1.00)
GFR calc Af Amer: 40 mL/min — ABNORMAL LOW (ref >60)
GFR, EST NON AFRICAN AMERICAN: 35 mL/min — AB (ref >60)
GLUCOSE: 146 mg/dL — AB (ref 70–99)
POTASSIUM: 3.3 mmol/L — AB (ref 3.5–5.1)
Sodium: 133 mmol/L — ABNORMAL LOW (ref 135–145)

## 2018-04-23 LAB — GLUCOSE, CAPILLARY
GLUCOSE-CAPILLARY: 144 mg/dL — AB (ref 70–99)
GLUCOSE-CAPILLARY: 143 mg/dL — AB (ref 70–99)
GLUCOSE-CAPILLARY: 169 mg/dL — AB (ref 70–99)
GLUCOSE-CAPILLARY: 214 mg/dL — AB (ref 70–99)
Glucose-Capillary: 183 mg/dL — ABNORMAL HIGH (ref 70–99)

## 2018-04-23 LAB — HEPATIC FUNCTION PANEL
ALBUMIN: 3 g/dL — AB (ref 3.5–5.0)
ALK PHOS: 85 U/L (ref 38–126)
ALT: 78 U/L — ABNORMAL HIGH (ref 0–44)
AST: 107 U/L — AB (ref 15–41)
BILIRUBIN TOTAL: 2.2 mg/dL — AB (ref 0.3–1.2)
Bilirubin, Direct: 1.2 mg/dL — ABNORMAL HIGH (ref 0.0–0.2)
Indirect Bilirubin: 1 mg/dL — ABNORMAL HIGH (ref 0.3–0.9)
Total Protein: 6.6 g/dL (ref 6.5–8.1)

## 2018-04-23 LAB — PHOSPHORUS: Phosphorus: 3.1 mg/dL (ref 2.5–4.6)

## 2018-04-23 MED ORDER — POTASSIUM CHLORIDE CRYS ER 20 MEQ PO TBCR
20.0000 meq | EXTENDED_RELEASE_TABLET | Freq: Two times a day (BID) | ORAL | Status: DC
  Administered 2018-04-23 – 2018-04-24 (×3): 20 meq via ORAL
  Filled 2018-04-23 (×3): qty 1

## 2018-04-23 MED ORDER — DILTIAZEM HCL 30 MG PO TABS
60.0000 mg | ORAL_TABLET | Freq: Four times a day (QID) | ORAL | Status: DC
  Administered 2018-04-23 – 2018-04-24 (×3): 60 mg via ORAL
  Filled 2018-04-23 (×4): qty 2

## 2018-04-23 MED ORDER — CALCIUM CARBONATE ANTACID 500 MG PO CHEW
500.0000 mg | CHEWABLE_TABLET | Freq: Three times a day (TID) | ORAL | Status: AC
  Administered 2018-04-23 (×2): 500 mg via ORAL
  Filled 2018-04-23 (×2): qty 3

## 2018-04-23 MED ORDER — AMLODIPINE BESYLATE 5 MG PO TABS
5.0000 mg | ORAL_TABLET | Freq: Every day | ORAL | Status: DC
  Administered 2018-04-23: 5 mg via ORAL
  Filled 2018-04-23: qty 1

## 2018-04-23 MED ORDER — MAGNESIUM OXIDE 400 (241.3 MG) MG PO TABS
400.0000 mg | ORAL_TABLET | Freq: Every day | ORAL | Status: DC
  Administered 2018-04-23 – 2018-05-04 (×12): 400 mg via ORAL
  Filled 2018-04-23 (×13): qty 1

## 2018-04-23 NOTE — Progress Notes (Signed)
Patient Name: Danielle [name] Date of Encounter: 04/23/2018  Hospital Problem List     Active Problems:   Acute hypoxemic respiratory failure [facility])    Patient Profile     62 year old female with admission with atrial fibrillation with rapid ventricular response.  Main complaint today is abdominal pain.  She states she moved her bowels yesterday without improvement in her symptoms.  Patient was converted from IV to p.o. diltiazem yesterday and placed on apixaban.  No bleeding thus far.  Rate is in variable control.  Subjective   Abdominal pain  Inpatient Medications    . amLODipine  10 mg Oral Daily  . apixaban  5 mg Oral BID  . azithromycin  500 mg Oral Daily  . citalopram  20 mg Oral Daily  . diltiazem  30 mg Oral Q6H  . feeding supplement  1 Container Oral TID BM  . insulin aspart  0-15 Units Subcutaneous Q4H  . ipratropium-albuterol  3 mL Nebulization Q6H  . mometasone-formoterol  2 puff Inhalation BID  . multivitamin with minerals  1 tablet Oral Daily  . predniSONE  40 mg Oral Q breakfast    Vital Signs    Vitals:   04/22/18 2029 04/22/18 2102 04/22/18 2329 04/23/18 0525  BP: 123/87  103/77 110/85  Pulse: (!) 113  (!) 121 (!) 119  Resp: 17   17  Temp: 98.3 F (36.8 C)   98 F (36.7 C)  TempSrc: Oral   Oral  SpO2: 100% 100% 100% 100%  Weight:      Height:        Intake/Output Summary (Last 24 hours) at 04/23/2018 0732 Last data filed at 04/22/2018 2137 Gross per 24 hour  Intake 524.08 ml  Output 200 ml  Net 324.08 ml   Filed Weights   04/21/18 0345 04/21/18 0645 04/22/18 1317  Weight: 63 kg (139 lb) 60.2 kg (132 lb 11.5 oz) 64 kg (141 lb 3.2 oz)    Physical Exam    GEN: Well nourished, well developed, in no acute distress.  HEENT: normal.  Neck: Supple, no JVD, carotid bruits, or masses. Cardiac: Irregularly irregular, no murmurs, rubs, or gallops. No clubbing, cyanosis, edema.  Radials/DP/PT 2+ and equal bilaterally.  Respiratory:  Respirations  regular and unlabored, clear to auscultation bilaterally. GI: Mild distension with tenderness. Distant bowel sounds MS: no deformity or atrophy. Skin: warm and dry, no rash. Neuro:  Strength and sensation are intact. Psych: Normal affect.  Labs    CBC Recent Labs    04/21/18 0358 04/22/18 0511  WBC 7.9 5.3  NEUTROABS 4.7  --   HGB 9.3* 8.0*  HCT 27.2* 23.9*  MCV 103.3* 102.4*  PLT 229 716   Basic Metabolic Panel Recent Labs    04/21/18 0358 04/21/18 0641 04/21/18 1822 04/22/18 0511  NA 142  --   --  140  K 2.5*  --  3.0* 3.1*  CL 106  --   --  109  CO2 15*  --   --  19*  GLUCOSE 61*  --   --  158*  BUN 16  --   --  20  CREATININE 1.51*  --   --  1.29*  CALCIUM 6.9*  --   --  6.9*  MG  --  1.9  --  1.8  PHOS  --  4.7*  --  1.4*   Liver Function Tests Recent Labs    04/21/18 0358 04/22/18 0511  AST 182*  --  ALT 100*  --   ALKPHOS 102  --   BILITOT 4.2*  --   PROT 7.5  --   ALBUMIN 3.4* 3.0*   No results for input(s): LIPASE, AMYLASE in the last 72 hours. Cardiac Enzymes Recent Labs    04/21/18 0358 04/21/18 0641 04/21/18 0939  TROPONINI 0.04* 0.04* 0.04*   BNP Recent Labs    04/21/18 0358  BNP 2,666.0*   D-Dimer No results for input(s): DDIMER in the last 72 hours. Hemoglobin A1C No results for input(s): HGBA1C in the last 72 hours. Fasting Lipid Panel No results for input(s): CHOL, HDL, LDLCALC, TRIG, CHOLHDL, LDLDIRECT in the last 72 hours. Thyroid Function Tests No results for input(s): TSH, T4TOTAL, T3FREE, THYROIDAB in the last 72 hours.  Invalid input(s): FREET3  Telemetry    afib with variable but occasionally rapid vr.   ECG    afib with variable vr  Radiology    Ct Chest Wo Contrast  Result Date: 04/21/2018 CLINICAL DATA:  62 year old female with a history of abnormal chest x-ray and prior known right-sided lung carcinoma status post treatment EXAM: CT CHEST WITHOUT CONTRAST TECHNIQUE: Multidetector CT imaging of the  chest was performed following the standard protocol without IV contrast. COMPARISON:  Chest x-ray today's date, prior CT 09/12/2017, 03/01/2017, PET-CT 07/13/2016 FINDINGS: Cardiovascular: Cardiac size unchanged. No pericardial fluid/thickening. Dense calcifications of left main, left anterior descending, circumflex, right coronary arteries. Calcifications of the aortic valve. Calcifications of the transverse arch and descending thoracic aorta. No aneurysm. Incidental calcifications of the suprarenal aorta of the upper abdomen. Diameter of the main pulmonary artery unremarkable. Mediastinum/Nodes: Small lymph nodes of the mediastinum, including paratracheal nodes unchanged in number from the comparison CT. Unchanged subcarinal lymph node. Lungs/Pleura: The abnormality on today's chest x-ray corresponds 2 consolidation/architectural distortion and confluent opacity of the right upper lobe which is more pronounced than the comparison CT of 09/12/2017. Volume loss with linear and nodular opacity of the right upper lobe, extends along the peribronchovascular Cher of right upper lobe bronchi to the pleura anteriorly and medially. Nodular changes are inseparable from the hilar structures with volume loss and contraction of the proximal airways of the right upper lobe. Bronchus intermedius and the middle lobe bronchi remain patent. Right lower lobe airways remain patent. No confluent airspace disease. Diameter of the nodularity on image 47 measures 3.5 cm x 2.9 cm, and more inferiorly measures 4.8 cm x 3.5 cm. All of these changes are in the treated area from the prior CT. Left lung demonstrates similar emphysematous changes and scarring at the apex. No new nodule/mass identified on the left. No confluent airspace disease. Small right greater than left pleural effusions, new from the prior. Upper Abdomen: Steatosis. Musculoskeletal: No acute displaced fracture. Degenerative changes of the spine. No new aggressive bony  lesion identified. IMPRESSION: The abnormality of this chest x-ray corresponds to nodular/consolidative changes of the right upper lobe with architectural distortion in the area of prior therapy. 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 Correlation with the patient's prior treatment may be useful, as well as potential future imaging with contrast-enhanced CT chest or alternatively PET-CT. Small lymph nodes of the mediastinum, without increasing lymphadenopathy. Small right greater than left pleural effusion. Emphysema.  Emphysema (ICD10-J43.9). Aortic atherosclerosis with left main and 3 vessel coronary artery disease. Aortic Atherosclerosis (ICD10-I70.0). Hepatic steatosis. Electronically Signed   By: York Cerise  Earleen Newport D.O.   On: 04/21/2018 17:28   US Venous Img Lower Bilateral  Result Date: 04/21/2018 CLINICAL DATA:  Bilateral lower extremity pain and edema. History of fall 2 weeks ago. History of breast cancer. Evaluate for DVT. EXAM: BILATERAL LOWER EXTREMITY VENOUS DOPPLER ULTRASOUND TECHNIQUE: Gray-scale sonography with graded compression, as well as color Doppler and duplex ultrasound were performed to evaluate the lower extremity deep venous systems from the level of the common femoral vein and including the common femoral, femoral, profunda femoral, popliteal and calf veins including the posterior tibial, peroneal and gastrocnemius veins when visible. The superficial great saphenous vein was also interrogated. Spectral Doppler was utilized to evaluate flow at rest and with distal augmentation maneuvers in the common femoral, femoral and popliteal veins. COMPARISON:  None. FINDINGS: RIGHT LOWER EXTREMITY Common Femoral Vein: No evidence of thrombus. Normal compressibility, respiratory phasicity and response to augmentation. Saphenofemoral Junction: No evidence of  thrombus. Normal compressibility and flow on color Doppler imaging. Profunda Femoral Vein: No evidence of thrombus. Normal compressibility and flow on color Doppler imaging. Femoral Vein: No evidence of thrombus. Normal compressibility, respiratory phasicity and response to augmentation. Popliteal Vein: No evidence of thrombus. Normal compressibility, respiratory phasicity and response to augmentation. Calf Veins: No evidence of thrombus. Normal compressibility and flow on color Doppler imaging. Superficial Great Saphenous Vein: No evidence of thrombus. Normal compressibility. Venous Reflux:  None. Other Findings:  None. LEFT LOWER EXTREMITY Common Femoral Vein: No evidence of thrombus. Normal compressibility, respiratory phasicity and response to augmentation. Saphenofemoral Junction: No evidence of thrombus. Normal compressibility and flow on color Doppler imaging. Profunda Femoral Vein: No evidence of thrombus. Normal compressibility and flow on color Doppler imaging. Femoral Vein: No evidence of thrombus. Normal compressibility, respiratory phasicity and response to augmentation. Popliteal Vein: No evidence of thrombus. Normal compressibility, respiratory phasicity and response to augmentation. Calf Veins: No evidence of thrombus. Normal compressibility and flow on color Doppler imaging. Superficial Great Saphenous Vein: No evidence of thrombus. Normal compressibility. Venous Reflux:  None. Other Findings:  None. IMPRESSION: No evidence of DVT within either lower extremity. Electronically Signed   By: Sandi Mariscal M.D.   On: 04/21/2018 14:13   Dg Chest Port 1 View  Result Date: 04/21/2018 CLINICAL DATA:  Initial evaluation for acute shortness of breath, history of COPD. EXAM: PORTABLE CHEST 1 VIEW COMPARISON:  Prior radiograph from 04/19/2017 as well as prior CT from 09/12/2017. FINDINGS: Mild cardiomegaly, stable. Aortic atherosclerosis. Increased masslike density at the right hilum measuring approximately  3.7 x 3.5 cm. Adjacent right perihilar scarring and architectural distortion. No other focal infiltrates. No pleural effusion or pulmonary edema. Chronic bronchitic changes. No pneumothorax. No acute osseous abnormality. IMPRESSION: 1. Increased prominence of 3.5 cm mass like opacity at the right hilum, concerning for worsened malignancy. Further assessment with cross-sectional imaging of the chest suggested for further characterization. 2. Underlying chronic emphysematous changes. 3. Cardiomegaly with aortic atherosclerosis. Electronically Signed   By: Jeannine Boga M.D.   On: 04/21/2018 04:54    Assessment & Plan    62 year old female with history of COPD with admission with shortness of breath and noted to be in A. fib with RVR.  Chest x-ray revealed increased prominence of 3.5 cm masslike opacity in the right hilum concerning for worsening malignancy.  She has chronic COPD.  She complains of abdominal discomfort.  A. fib has been in variable control.  Was on IV Cardizem and converted p.o.  Still has some episodes of fairly rapid ventricular  response.  We added Eliquis yesterday secondary to a chads score of 2, 1 for female gender and 1 for hypertension.  Will reduce her amlodipine to 5 mg daily due to relative hypotension and increase Cardizem to 60 mg every 6 and follow.  Her potassium is 3.1.  Will need to increase supplementation.  We will also add magnesium oxalate 40 mg daily and follow.  Magnesium was 1.8 yesterday. Signed, Javier Docker Milind Raether MD 04/23/2018, 7:32 AM  Pager: [phone]

## 2018-04-23 NOTE — Plan of Care (Signed)
  Problem: Coping: Goal: Level of anxiety will decrease Outcome: Progressing   Problem: Safety: Goal: Ability to remain free from injury will improve Outcome: Progressing   Problem: Skin Integrity: Goal: Risk for impaired skin integrity will decrease Outcome: Progressing   Problem: Education: Goal: Knowledge of General Education information will improve Outcome: Completed/Met

## 2018-04-23 NOTE — Consult Note (Signed)
[place] CONSULT NOTE  Patient Care Team: Elisabeth Cara, NP as PCP - General (Nurse Practitioner)  CHIEF COMPLAINTS/PURPOSE OF CONSULTATION:  Lung cancer  HISTORY OF PRESENTING ILLNESS:  Danielle [name] 62 y.o.  female with long-standing history of smoking currently quit COPD; remote history of breast cancer; also history of lung cancer stage 12 status post SBRT; most recently in 2017 needs currently admitted to hospital for worsening shortness of breath cough.  Patient was initially admitted to the ICU for acute respiratory failure; needing BiPAP.  Patient has been treated with steroids; breathing treatments-and currently on the regular floor nasal cannula.  Patient had a chest x-ray that was concerning for progression of right hilar worsening malignancy.  Patient went on to have a CT scan of the chest that showed- Increasing nodular area in the right middle lobe-question radiation changes versus recurrent disease.  Stable mediastinal neuropathy.  Patient also noted to have worsening anemia hemoglobin on 8 microcytic.  Also noted to have mild worsening renal function creatinine 1.5 baseline-0.7.  Oncology has been consulted for further evaluation recommendations.  Patient states her breathing is better; however not resolved.  She complains of right upper quadrant abdominal pain.  Positive for constipation however had a bowel movement this morning.  Poor appetite.  Of note patient noted to have scabies infestation.  ROS: A complete 10 point review of system is done which is negative except mentioned above in history of present illness  MEDICAL HISTORY:  Past Medical History:  Diagnosis Date  . Anxiety   . Arthritis   . Cancer ([place]) left    breast cancer 2000, chemo tx's with total mastectomy and lymph nodes resected.   . Cancer of right lung ([place]) 02/21/2016   rad tx's.   [name] COPD (chronic obstructive pulmonary disease) ([place])   . Dependence on supplemental oxygen   .  Depression   . Heart murmur   . Hypertension   . Lung nodule   . Lymphedema   . Shortness of breath dyspnea    with exertion  . Status post chemotherapy 2001   left breast cancer  . Status post radiation therapy 2001   left breast cancer    SURGICAL HISTORY: Past Surgical History:  Procedure Laterality Date  . Breast Biospy Left    [facility]  . BREAST SURGERY    . DILATION AND CURETTAGE OF UTERUS    . ELECTROMAGNETIC NAVIGATION BROCHOSCOPY Right 04/11/2016   Procedure: ELECTROMAGNETIC NAVIGATION BRONCHOSCOPY;  Surgeon: Vilinda Boehringer, MD;  Location: [facility] ORS;  Service: Cardiopulmonary;  Laterality: Right;  . history of colonoscopy]    . LUNG BIOPSY    . MASTECTOMY Left    2000, [facility]  . ROTATOR CUFF REPAIR Right    [facility]    SOCIAL HISTORY: Social History   Socioeconomic History  . Marital status: Divorced    Spouse name: Not on file  . Number of children: Not on file  . Years of education: Not on file  . Highest education level: Not on file  Occupational History  . Not on file  Social Needs  . Financial resource strain: Not on file  . Food insecurity:    Worry: Not on file    Inability: Not on file  . Transportation needs:    Medical: Not on file    Non-medical: Not on file  Tobacco Use  . Smoking status: Former Smoker    Packs/day: 0.50    Years: 20.00    Pack years:  10.00    Types: Cigarettes    Last attempt to quit: 07/02/2012    Years since quitting: 5.8  . Smokeless tobacco: Never Used  . Tobacco comment: quit 2014  Substance and Sexual Activity  . Alcohol use: Yes    Comment: Occasionally  . Drug use: No  . Sexual activity: Never  Lifestyle  . Physical activity:    Days per week: Not on file    Minutes per session: Not on file  . Stress: Not on file  Relationships  . Social connections:    Talks on phone: Not on file    Gets together: Not on file    Attends religious service: Not on file    Active member of club or organization: Not on file     Attends meetings of clubs or organizations: Not on file    Relationship status: Not on file  . Intimate partner violence:    Fear of current or ex partner: Not on file    Emotionally abused: Not on file    Physically abused: Not on file    Forced sexual activity: Not on file  Other Topics Concern  . Not on file  Social History Narrative  . Not on file    FAMILY HISTORY: Family History  Problem Relation Age of Onset  . Breast cancer Mother 7  . Cancer Mother        Breast   . Cirrhosis Father   . Breast cancer Paternal Aunt 12  . Cancer Maternal Aunt        Breast     ALLERGIES:  has No Known Allergies.  MEDICATIONS:  Current Facility-Administered Medications  Medication Dose Route Frequency Provider Last Rate Last Dose  . acetaminophen (TYLENOL) tablet 650 mg  650 mg Oral Q6H PRN Arta Silence, MD   650 mg at 04/22/18 2251   Or  . acetaminophen (TYLENOL) suppository 650 mg  650 mg Rectal Q6H PRN Arta Silence, MD      . amLODipine (NORVASC) tablet 5 mg  5 mg Oral Daily Teodoro Spray, MD   5 mg at 04/23/18 0904  . apixaban (ELIQUIS) tablet 5 mg  5 mg Oral BID Teodoro Spray, MD   5 mg at 04/23/18 2111  . bisacodyl (DULCOLAX) EC tablet 5 mg  5 mg Oral Daily PRN Arta Silence, MD      . calcium carbonate (TUMS - dosed in mg elemental calcium) chewable tablet 500 mg of elemental calcium  500 mg of elemental calcium Oral TID WC Demetrios Loll, MD   500 mg of elemental calcium at 04/23/18 1712  . citalopram (CELEXA) tablet 20 mg  20 mg Oral Daily Arta Silence, MD   20 mg at 04/23/18 0905  . dextrose 50 % solution 25 g  25 g Intravenous PRN Arta Silence, MD      . diltiazem (CARDIZEM) tablet 60 mg  60 mg Oral Q6H Teodoro Spray, MD   60 mg at 04/23/18 1424  . feeding supplement (BOOST / RESOURCE BREEZE) liquid 1 Container  1 Container Oral TID BM Flora Lipps, MD   1 Container at 04/22/18 2033  . HYDROcodone-acetaminophen (NORCO/VICODIN) 5-325 MG per  tablet 1 tablet  1 tablet Oral Q4H PRN Arta Silence, MD   1 tablet at 04/23/18 1750  . insulin aspart (novoLOG) injection 0-15 Units  0-15 Units Subcutaneous Q4H Flora Lipps, MD   5 Units at 04/23/18 2111  . ipratropium-albuterol (DUONEB) 0.5-2.5 (3) MG/3ML nebulizer solution  3 mL  3 mL Nebulization Q6H Demetrios Loll, MD   3 mL at 04/23/18 2139  . ipratropium-albuterol (DUONEB) 0.5-2.5 (3) MG/3ML nebulizer solution 3 mL  3 mL Nebulization Q4H PRN Demetrios Loll, MD   3 mL at 04/22/18 1657  . magnesium oxide (MAG-OX) tablet 400 mg  400 mg Oral Daily Teodoro Spray, MD   400 mg at 04/23/18 0904  . mometasone-formoterol (DULERA) 200-5 MCG/ACT inhaler 2 puff  2 puff Inhalation BID Flora Lipps, MD   2 puff at 04/23/18 2111  . multivitamin with minerals tablet 1 tablet  1 tablet Oral Daily Flora Lipps, MD   1 tablet at 04/23/18 0905  . ondansetron (ZOFRAN) tablet 4 mg  4 mg Oral Q6H PRN Arta Silence, MD       Or  . ondansetron (ZOFRAN) injection 4 mg  4 mg Intravenous Q6H PRN Arta Silence, MD   4 mg at 04/23/18 0904  . potassium chloride SA (K-DUR,KLOR-CON) CR tablet 20 mEq  20 mEq Oral BID Teodoro Spray, MD   20 mEq at 04/23/18 2111  . predniSONE (DELTASONE) tablet 40 mg  40 mg Oral Q breakfast Arta Silence, MD   40 mg at 04/23/18 0904  . senna-docusate (Senokot-S) tablet 1 tablet  1 tablet Oral QHS PRN Arta Silence, MD   1 tablet at 04/23/18 0010      .  PHYSICAL EXAMINATION:  Vitals:   04/23/18 2010 04/23/18 2139  BP: 103/78   Pulse: (!) 102   Resp:    Temp: 98 F (36.7 C)   SpO2: 100% 98%   Filed Weights   04/21/18 0345 04/21/18 0645 04/22/18 1317  Weight: 139 lb (63 kg) 132 lb 11.5 oz (60.2 kg) 141 lb 3.2 oz (64 kg)    GENERAL: Moderately nourished well-developed; Alert, no distress and comfortable.  She is alone. EYES: Positive for pallor no icterus OROPHARYNX: no thrush or ulceration. NECK: supple, no masses felt LYMPH:  no palpable  lymphadenopathy in the cervical, axillary or inguinal regions LUNGS: decreased breath sounds to auscultation at bases and  No wheeze or crackles HEART/CVS: regular rate & rhythm and no murmurs; No lower extremity edema ABDOMEN: abdomen soft, non-tender and normal bowel sounds Musculoskeletal:no cyanosis of digits and no clubbing  PSYCH: alert & oriented x 3 with fluent speech NEURO: no focal motor/sensory deficits SKIN:  no rashes or significant lesions  LABORATORY DATA:  I have reviewed the data as listed Lab Results  Component Value Date   WBC 5.3 04/22/2018   HGB 8.0 (L) 04/22/2018   HCT 23.9 (L) 04/22/2018   MCV 102.4 (H) 04/22/2018   PLT 206 04/22/2018   Recent Labs    09/19/17 0846 04/21/18 0358 04/21/18 1822 04/22/18 0511 04/23/18 0600  NA 134* 142  --  140 133*  K 4.4 2.5* 3.0* 3.1* 3.3*  CL 101 106  --  109 102  CO2 24 15*  --  19* 20*  GLUCOSE 95 61*  --  158* 146*  BUN 12 16  --  20 31*  CREATININE 0.69 1.51*  --  1.29* 1.54*  CALCIUM 8.9 6.9*  --  6.9* 6.8*  GFRNONAA >60 35*  --  43* 35*  GFRAA >60 41*  --  50* 40*  PROT 7.8 7.5  --   --  6.6  ALBUMIN 3.9 3.4*  --  3.0* 3.0*  AST 75* 182*  --   --  107*  ALT 47 100*  --   --  78*  ALKPHOS 119 102  --   --  85  BILITOT 0.8 4.2*  --   --  2.2*  BILIDIR  --   --   --   --  1.2*  IBILI  --   --   --   --  1.0*    RADIOGRAPHIC STUDIES: I have personally reviewed the radiological images as listed and agreed with the findings in the report. Ct Chest Wo Contrast  Result Date: 04/21/2018 CLINICAL DATA:  62 year old female with a history of abnormal chest x-ray and prior known right-sided lung carcinoma status post treatment EXAM: CT CHEST WITHOUT CONTRAST TECHNIQUE: Multidetector CT imaging of the chest was performed following the standard protocol without IV contrast. COMPARISON:  Chest x-ray today's date, prior CT 09/12/2017, 03/01/2017, PET-CT 07/13/2016 FINDINGS: Cardiovascular: Cardiac size unchanged. No  pericardial fluid/thickening. Dense calcifications of left main, left anterior descending, circumflex, right coronary arteries. Calcifications of the aortic valve. Calcifications of the transverse arch and descending thoracic aorta. No aneurysm. Incidental calcifications of the suprarenal aorta of the upper abdomen. Diameter of the main pulmonary artery unremarkable. Mediastinum/Nodes: Small lymph nodes of the mediastinum, including paratracheal nodes unchanged in number from the comparison CT. Unchanged subcarinal lymph node. Lungs/Pleura: The abnormality on today's chest x-ray corresponds 2 consolidation/architectural distortion and confluent opacity of the right upper lobe which is more pronounced than the comparison CT of 09/12/2017. Volume loss with linear and nodular opacity of the right upper lobe, extends along the peribronchovascular Cher of right upper lobe bronchi to the pleura anteriorly and medially. Nodular changes are inseparable from the hilar structures with volume loss and contraction of the proximal airways of the right upper lobe. Bronchus intermedius and the middle lobe bronchi remain patent. Right lower lobe airways remain patent. No confluent airspace disease. Diameter of the nodularity on image 47 measures 3.5 cm x 2.9 cm, and more inferiorly measures 4.8 cm x 3.5 cm. All of these changes are in the treated area from the prior CT. Left lung demonstrates similar emphysematous changes and scarring at the apex. No new nodule/mass identified on the left. No confluent airspace disease. Small right greater than left pleural effusions, new from the prior. Upper Abdomen: Steatosis. Musculoskeletal: No acute displaced fracture. Degenerative changes of the spine. No new aggressive bony lesion identified. IMPRESSION: The abnormality of this chest x-ray corresponds to nodular/consolidative changes of the right upper lobe with architectural distortion in the area of prior therapy. Given the appearance and  prior treatment, these are favored to represent radiation fibrosis as an evolution of the patient's treatment, however, residual/recurrent tumor cannot be excluded. Correlation with the patient's prior treatment may be useful, as well as potential future imaging with contrast-enhanced CT chest or alternatively PET-CT. Small lymph nodes of the mediastinum, without increasing lymphadenopathy. Small right greater than left pleural effusion. Emphysema.  Emphysema (ICD10-J43.9). Aortic atherosclerosis with left main and 3 vessel coronary artery disease. Aortic Atherosclerosis (ICD10-I70.0). Hepatic steatosis. Electronically Signed   By: Corrie Mckusick D.O.   On: 04/21/2018 17:28   US Venous Img Lower Bilateral  Result Date: 04/21/2018 CLINICAL DATA:  Bilateral lower extremity pain and edema. History of fall 2 weeks ago. History of breast cancer. Evaluate for DVT. EXAM: BILATERAL LOWER EXTREMITY VENOUS DOPPLER ULTRASOUND TECHNIQUE: Gray-scale sonography with graded compression, as well as color Doppler and duplex ultrasound were performed to evaluate the lower extremity deep venous systems from the level of the common femoral vein and including the common femoral, femoral, profunda  femoral, popliteal and calf veins including the posterior tibial, peroneal and gastrocnemius veins when visible. The superficial great saphenous vein was also interrogated. Spectral Doppler was utilized to evaluate flow at rest and with distal augmentation maneuvers in the common femoral, femoral and popliteal veins. COMPARISON:  None. FINDINGS: RIGHT LOWER EXTREMITY Common Femoral Vein: No evidence of thrombus. Normal compressibility, respiratory phasicity and response to augmentation. Saphenofemoral Junction: No evidence of thrombus. Normal compressibility and flow on color Doppler imaging. Profunda Femoral Vein: No evidence of thrombus. Normal compressibility and flow on color Doppler imaging. Femoral Vein: No evidence of thrombus. Normal  compressibility, respiratory phasicity and response to augmentation. Popliteal Vein: No evidence of thrombus. Normal compressibility, respiratory phasicity and response to augmentation. Calf Veins: No evidence of thrombus. Normal compressibility and flow on color Doppler imaging. Superficial Great Saphenous Vein: No evidence of thrombus. Normal compressibility. Venous Reflux:  None. Other Findings:  None. LEFT LOWER EXTREMITY Common Femoral Vein: No evidence of thrombus. Normal compressibility, respiratory phasicity and response to augmentation. Saphenofemoral Junction: No evidence of thrombus. Normal compressibility and flow on color Doppler imaging. Profunda Femoral Vein: No evidence of thrombus. Normal compressibility and flow on color Doppler imaging. Femoral Vein: No evidence of thrombus. Normal compressibility, respiratory phasicity and response to augmentation. Popliteal Vein: No evidence of thrombus. Normal compressibility, respiratory phasicity and response to augmentation. Calf Veins: No evidence of thrombus. Normal compressibility and flow on color Doppler imaging. Superficial Great Saphenous Vein: No evidence of thrombus. Normal compressibility. Venous Reflux:  None. Other Findings:  None. IMPRESSION: No evidence of DVT within either lower extremity. Electronically Signed   By: Sandi Mariscal M.D.   On: 04/21/2018 14:13   Dg Chest Port 1 View  Result Date: 04/21/2018 CLINICAL DATA:  Initial evaluation for acute shortness of breath, history of COPD. EXAM: PORTABLE CHEST 1 VIEW COMPARISON:  Prior radiograph from 04/19/2017 as well as prior CT from 09/12/2017. FINDINGS: Mild cardiomegaly, stable. Aortic atherosclerosis. Increased masslike density at the right hilum measuring approximately 3.7 x 3.5 cm. Adjacent right perihilar scarring and architectural distortion. No other focal infiltrates. No pleural effusion or pulmonary edema. Chronic bronchitic changes. No pneumothorax. No acute osseous abnormality.  IMPRESSION: 1. Increased prominence of 3.5 cm mass like opacity at the right hilum, concerning for worsened malignancy. Further assessment with cross-sectional imaging of the chest suggested for further characterization. 2. Underlying chronic emphysematous changes. 3. Cardiomegaly with aortic atherosclerosis. Electronically Signed   By: Jeannine Boga M.D.   On: 04/21/2018 04:54   US Abdomen Limited Ruq  Result Date: 04/23/2018 CLINICAL DATA:  Elevated bilirubin. EXAM: ULTRASOUND ABDOMEN LIMITED RIGHT UPPER QUADRANT COMPARISON:  PET-CT dated July 13, 2016. FINDINGS: Gallbladder: Contracted. No gallstones or wall thickening visualized. No sonographic Murphy sign noted by sonographer. Common bile duct: Diameter: 6-7 mm, at the upper limits of normal in size. Liver: No focal lesion identified. Diffusely increased in parenchymal echogenicity. 9 mm simple cyst in the right hepatic lobe. Portal vein is patent on color Doppler imaging with normal direction of blood flow towards the liver. IMPRESSION: 1. No acute abnormality. 2. Severe hepatic steatosis. Electronically Signed   By: Titus Dubin M.D.   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  No worsening lesions noted anywhere else.  Recommend PET scan for further evaluation/outpatient basis.  # Prior history of breast cancer-no evidence of recurrence.   #Acute on chronic respiratory failure-COPD exacerbation-currently improving.  #Right upper quadrant pain-unclear etiology; mild elevated bilirubin ultrasound-negative for any acute process possible fatty  liver.  #Chronic anemia-mild macrocytosis.  Normal B12; will rule out hemolysis question alcohol.  Recommend transfusion if hemoglobin less than 8.  Thank you Dr.Chen for allowing me to participate in the care of your pleasant patient. Please do not hesitate to contact me with questions or concerns in the interim.  Discussed with palliative care nurse.  All questions were answered. The patient knows to call the clinic with any problems, questions or concerns.    [name], MD 04/23/2018 10:05 PM

## 2018-04-23 NOTE — Progress Notes (Signed)
Pharmacy Electrolyte Monitoring Consult:  Pharmacy consulted to assist in monitoring and replacing electrolytes in this 62 y.o. female admitted on 04/21/2018 with Shortness of Breath  Patient received furosemide 40mg  IV x 1 on 7/8. Patient has received 167mEq or oral potassium and 65mEq of IV potassium on 7/8.   Labs:  Sodium (mmol/L)  Date Value  04/23/2018 133 (L)  02/09/2015 132 (L)   Potassium (mmol/L)  Date Value  04/23/2018 3.3 (L)  02/09/2015 3.8   Magnesium (mg/dL)  Date Value  04/23/2018 2.2  12/07/2013 1.8   Phosphorus (mg/dL)  Date Value  04/23/2018 3.1   Calcium (mg/dL)  Date Value  04/23/2018 6.8 (L)   Calcium, Total (mg/dL)  Date Value  02/09/2015 9.1   Albumin (g/dL)  Date Value  04/22/2018 3.0 (L)  02/09/2015 4.3   Corrected Calcium: 7.7   Assessment/Plan: Potassium phosphate 30 mmol IV x 1. Magnesium 2g IV X 1.    Will recheck all electrolytes with am labs.   Patient will need to continue to received oral replacement as tolerated.   04/23/18 06:00 K 3.3, Mg 2.2, phos 3.1, Ca 6.8, adjusted calcium 7.6. Dr. Ubaldo Glassing has already ordered Klor-Con 20 mEq po BID and magnesium oxide 400 mg po daily. Continue those supplements as ordered. Will also give calcium carbonate 500 mg of elemental calcium TID with meals x 3 doses. Recheck all electrolytes tomorrow with AM labs.   Pharmacy will continue to monitor and adjust per consult.   Laural Benes, Pharm.D., BCPS Clinical Pharmacist 04/23/2018 9:10 AM

## 2018-04-23 NOTE — Progress Notes (Signed)
PT Cancellation Note  Patient Details Name: Danielle [name] MRN: [id number] DOB: 1956/09/30   Cancelled Treatment:     PT attempted eval this afternoon. Pt refuses due to pain. Pt states that she may work with PT tomorrow depending on how she feels and results of pending imaging.   Yolonda Kida, SPT   [name] 04/23/2018, 3:14 PM

## 2018-04-23 NOTE — Progress Notes (Addendum)
Danielle [name] at [place] NAME: Danielle [name]    MR#:  [id number]  DATE OF BIRTH:  Jun 15, 1956  SUBJECTIVE:  CHIEF COMPLAINT:   Chief Complaint  Patient presents with  . Shortness of Breath   Nausea and vomiting last night.  Epigastric and right upper quadrant pain. REVIEW OF SYSTEMS:  Review of Systems  Constitutional: Positive for malaise/fatigue. Negative for chills and fever.  HENT: Negative for sore throat.   Eyes: Negative for blurred vision and double vision.  Respiratory: Positive for cough and shortness of breath. Negative for hemoptysis, wheezing and stridor.   Cardiovascular: Negative for chest pain, palpitations, orthopnea and leg swelling.       Bilateral ankle swelling  Gastrointestinal: Positive for abdominal pain, nausea and vomiting. Negative for blood in stool, diarrhea and melena.  Genitourinary: Negative for dysuria, flank pain and hematuria.  Musculoskeletal: Negative for back pain and joint pain.  Skin: Negative for rash.  Neurological: Negative for dizziness, sensory change, focal weakness, seizures, loss of consciousness, weakness and headaches.  Endo/Heme/Allergies: Negative for polydipsia.  Psychiatric/Behavioral: Negative for depression. The patient is not nervous/anxious.     DRUG ALLERGIES:  No Known Allergies VITALS:  Blood pressure 102/74, pulse 90, temperature 98.3 F (36.8 C), temperature source Oral, resp. rate 17, height 5\' 6"  (1.676 m), weight 141 lb 3.2 oz (64 kg), SpO2 100 %. PHYSICAL EXAMINATION:  Physical Exam  Constitutional: She is oriented to person, place, and time. She appears well-developed.  HENT:  Head: Normocephalic.  Mouth/Throat: Oropharynx is clear and moist.  Eyes: Pupils are equal, round, and reactive to light. Conjunctivae and EOM are normal. No scleral icterus.  Neck: Normal range of motion. Neck supple. No JVD present. No tracheal deviation present.  Cardiovascular: Normal rate  and normal heart sounds. Exam reveals no gallop.  No murmur heard. Pulmonary/Chest: Effort normal. No respiratory distress. She has no wheezes. She has no rales.  Diminished lung sounds  Abdominal: Soft. Bowel sounds are normal. She exhibits no distension. There is no tenderness. There is no rebound.  Musculoskeletal: Normal range of motion. She exhibits no edema or tenderness.  Neurological: She is alert and oriented to person, place, and time. No cranial nerve deficit.  Skin: No rash noted. No erythema.  Psychiatric: She has a normal mood and affect.   LABORATORY PANEL:  Female CBC Recent Labs  Lab 04/22/18 0511  WBC 5.3  HGB 8.0*  HCT 23.9*  PLT 206   ------------------------------------------------------------------------------------------------------------------ Chemistries  Recent Labs  Lab 04/23/18 0600  NA 133*  K 3.3*  CL 102  CO2 20*  GLUCOSE 146*  BUN 31*  CREATININE 1.54*  CALCIUM 6.8*  MG 2.2  AST 107*  ALT 78*  ALKPHOS 85  BILITOT 2.2*   RADIOLOGY:  No results found. ASSESSMENT AND PLAN:   87F acute hypoxemic respiratory failure, acute COPD exacerbation, dehydration, hypokalemia, hypoglycemia, renal failure, hypocalcemia, hypoalbuminemia, transaminasemia/hyperbilirubinemia, BNP elevation, Troponin-I elevation, macrocytic anemia.  Acute on chronic respiratory failure with hypoxia and hypercapnia due to COPD exacerbation, radiation pneumonitis/fibrosis and CHF. The patient is off BiPAP, on oxygen by nasal cannula 2L (home O2). Continue nebulizer and DuoNeb as needed, complete Zithromax, discontinued IV Solu-Medrol, changed to prednisone.  New onset A. fib with RVR.  The patient was treated with Cardizem IV without improvement.  Cardizem drip was started.  Consider anticoagulation for stroke prevention per Dr. Ubaldo Glassing.  Echocardiogram showed ejection fraction 35 to 40%. Change to p.o. Cardizem  and started Eliquis per Dr. Ubaldo Glassing.  Elevated troponin due to  demanding ischemia.   Chronic systolic CHF LV EF: 76% -   40%.  Stable. Lasix as needed.  Hypokalemia.  Continue potassium supplement and follow-up level.  Acute renal failure due to ATN.  Follow-up BMP.  Hypo-magnesium Mia and hypophosphatemia.  Improved with supplement.  Moderate Malnutrition related to chronic illness.  Follow dietitian's recommendation.  Abdominal pain, nausea and vomiting. Liver function test is unremarkable.  Follow-up abdominal ultrasound.  All the records are reviewed and case discussed with Care Management/Social Worker. Management plans discussed with the patient, her daughter and they are in agreement.   CODE STATUS: Full Code  TOTAL TIME TAKING CARE OF THIS PATIENT: 36 minutes.   More than 50% of the time was spent in counseling/coordination of care: YES  POSSIBLE D/C IN 2-3 DAYS, DEPENDING ON CLINICAL CONDITION.   Danielle [name] M.D on 04/23/2018 at 1:56 PM  Between 7am to 6pm - Pager - [phone]  After 6pm go to [email or web address] - [facility]

## 2018-04-23 NOTE — Progress Notes (Signed)
PT Cancellation Note  Patient Details Name: Danielle [name] MRN: [id number] DOB: Sep 21, 1956   Cancelled Treatment:     Order received, chart reviewed. PT called to speak with RN about lab values and palliative care consult, RN states that now is not a good time for PT to evaluate pt due to increased pain and pt currently being unwilling to work with PT.   Jodelle Fausto, SPT  Jahmiya Guidotti 04/23/2018, 11:30 AM

## 2018-04-23 NOTE — Progress Notes (Signed)
Pt complaining of RUQ abd pain/ tenderness and nausea/ Dr. Bridgett Larsson made aware

## 2018-04-24 DIAGNOSIS — J441 Chronic obstructive pulmonary disease with (acute) exacerbation: Principal | ICD-10-CM

## 2018-04-24 LAB — CBC WITH DIFFERENTIAL/PLATELET
Band Neutrophils: 0 %
Basophils Absolute: 0 10*3/uL (ref 0–0.1)
Basophils Relative: 0 %
Blasts: 0 %
EOS ABS: 0 10*3/uL (ref 0–0.7)
Eosinophils Relative: 0 %
HEMATOCRIT: 25.5 % — AB (ref 35.0–47.0)
Hemoglobin: 8.6 g/dL — ABNORMAL LOW (ref 12.0–16.0)
LYMPHS ABS: 0.8 10*3/uL — AB (ref 1.0–3.6)
LYMPHS PCT: 7 %
MCH: 34.7 pg — ABNORMAL HIGH (ref 26.0–34.0)
MCHC: 33.7 g/dL (ref 32.0–36.0)
MCV: 102.7 fL — ABNORMAL HIGH (ref 80.0–100.0)
MONO ABS: 0.5 10*3/uL (ref 0.2–0.9)
MONOS PCT: 4 %
MYELOCYTES: 0 %
Metamyelocytes Relative: 0 %
NEUTROS ABS: 10.3 10*3/uL — AB (ref 1.4–6.5)
Neutrophils Relative %: 89 %
Other: 0 %
PLATELETS: 241 10*3/uL (ref 150–440)
Promyelocytes Relative: 0 %
RBC: 2.49 MIL/uL — ABNORMAL LOW (ref 3.80–5.20)
RDW: 15.4 % — AB (ref 11.5–14.5)
WBC: 11.6 10*3/uL — AB (ref 3.6–11.0)
nRBC: 5 /100 WBC — ABNORMAL HIGH

## 2018-04-24 LAB — GLUCOSE, CAPILLARY
GLUCOSE-CAPILLARY: 128 mg/dL — AB (ref 70–99)
GLUCOSE-CAPILLARY: 172 mg/dL — AB (ref 70–99)
Glucose-Capillary: 111 mg/dL — ABNORMAL HIGH (ref 70–99)
Glucose-Capillary: 155 mg/dL — ABNORMAL HIGH (ref 70–99)

## 2018-04-24 LAB — RETICULOCYTES
RBC.: 2.49 MIL/uL — AB (ref 3.80–5.20)
RETIC COUNT ABSOLUTE: 164.3 10*3/uL (ref 19.0–183.0)
Retic Ct Pct: 6.6 % — ABNORMAL HIGH (ref 0.4–3.1)

## 2018-04-24 LAB — BASIC METABOLIC PANEL
Anion gap: 10 (ref 5–15)
BUN: 37 mg/dL — ABNORMAL HIGH (ref 8–23)
CALCIUM: 7.1 mg/dL — AB (ref 8.9–10.3)
CO2: 19 mmol/L — AB (ref 22–32)
CREATININE: 1.58 mg/dL — AB (ref 0.44–1.00)
Chloride: 100 mmol/L (ref 98–111)
GFR calc Af Amer: 39 mL/min — ABNORMAL LOW (ref >60)
GFR calc non Af Amer: 34 mL/min — ABNORMAL LOW (ref >60)
GLUCOSE: 120 mg/dL — AB (ref 70–99)
Potassium: 4.5 mmol/L (ref 3.5–5.1)
Sodium: 129 mmol/L — ABNORMAL LOW (ref 135–145)

## 2018-04-24 LAB — PHOSPHORUS: Phosphorus: 3.2 mg/dL (ref 2.5–4.6)

## 2018-04-24 LAB — LACTATE DEHYDROGENASE: LDH: 204 U/L — ABNORMAL HIGH (ref 98–192)

## 2018-04-24 LAB — MAGNESIUM: Magnesium: 2 mg/dL (ref 1.7–2.4)

## 2018-04-24 LAB — PATHOLOGIST SMEAR REVIEW

## 2018-04-24 MED ORDER — DILTIAZEM HCL ER COATED BEADS 240 MG PO CP24
240.0000 mg | ORAL_CAPSULE | Freq: Every day | ORAL | Status: DC
  Administered 2018-04-24 – 2018-04-25 (×2): 240 mg via ORAL
  Filled 2018-04-24 (×2): qty 1

## 2018-04-24 MED ORDER — DILTIAZEM HCL ER COATED BEADS 240 MG PO CP24: 240.0000 mg | ORAL_CAPSULE | Freq: Every day | ORAL | 1 refills | Status: DC

## 2018-04-24 MED ORDER — PREDNISONE 20 MG PO TABS: 40.0000 mg | ORAL_TABLET | Freq: Every day | ORAL | 0 refills | Status: DC

## 2018-04-24 MED ORDER — INSULIN ASPART 100 UNIT/ML ~~LOC~~ SOLN
0.0000 [IU] | Freq: Three times a day (TID) | SUBCUTANEOUS | Status: DC
  Administered 2018-04-24 (×2): 3 [IU] via SUBCUTANEOUS
  Administered 2018-04-24: 2 [IU] via SUBCUTANEOUS
  Administered 2018-04-25: 3 [IU] via SUBCUTANEOUS
  Administered 2018-04-26: 5 [IU] via SUBCUTANEOUS
  Filled 2018-04-24 (×7): qty 1

## 2018-04-24 MED ORDER — CALCIUM CARBONATE ANTACID 500 MG PO CHEW
500.0000 mg | CHEWABLE_TABLET | Freq: Two times a day (BID) | ORAL | Status: AC
  Administered 2018-04-24 – 2018-04-25 (×2): 500 mg via ORAL
  Filled 2018-04-24 (×2): qty 3

## 2018-04-24 MED ORDER — POTASSIUM CHLORIDE CRYS ER 20 MEQ PO TBCR: 20.0000 meq | EXTENDED_RELEASE_TABLET | Freq: Every day | ORAL | Status: DC

## 2018-04-24 MED ORDER — ALBUTEROL SULFATE HFA 108 (90 BASE) MCG/ACT IN AERS: 2.0000 | INHALATION_SPRAY | Freq: Four times a day (QID) | RESPIRATORY_TRACT | 2 refills | Status: AC | PRN

## 2018-04-24 MED ORDER — SODIUM CHLORIDE 0.9 % IV SOLN
INTRAVENOUS | Status: AC
  Administered 2018-04-24 – 2018-04-25 (×2): via INTRAVENOUS

## 2018-04-24 MED ORDER — BUDESONIDE-FORMOTEROL FUMARATE 160-4.5 MCG/ACT IN AERO: 2.0000 | INHALATION_SPRAY | Freq: Two times a day (BID) | RESPIRATORY_TRACT | 12 refills | Status: DC

## 2018-04-24 MED ORDER — APIXABAN 5 MG PO TABS: 5.0000 mg | ORAL_TABLET | Freq: Two times a day (BID) | ORAL | 0 refills | Status: DC

## 2018-04-24 MED ORDER — MORPHINE SULFATE (PF) 2 MG/ML IV SOLN
2.0000 mg | INTRAVENOUS | Status: DC | PRN
  Administered 2018-04-24 – 2018-05-04 (×8): 2 mg via INTRAVENOUS
  Filled 2018-04-24 (×8): qty 1

## 2018-04-24 NOTE — Progress Notes (Signed)
PT Cancellation Note  Patient Details Name: Danielle [name] MRN: [id number] DOB: Mar 20, 1956   Cancelled Treatment:     Order received chart reviewed. Discussed with RN who states pt is not feeling well and this morning labs came back abnormal. PT attempted to see pt. Pt appears very uncomfortable upon arrival complaining of 10/10 pain. Pt request to not work with PT this date. PT will attempt next date pt is medically appropriate.  Yolonda Kida, SPT    Deannah Rossi 04/24/2018, 11:48 AM

## 2018-04-24 NOTE — Progress Notes (Signed)
Patient Name: Danielle [name] Date of Encounter: 04/24/2018  Hospital Problem List     Active Problems:   Malignant neoplasm of upper lobe of right lung (HCC)   Acute hypoxemic respiratory failure (HCC)   Malnutrition of moderate degree   Palliative care by specialist   DNR (do not resuscitate) discussion   Weakness generalized    Patient Profile     62 year old female admitted with shortness of breath and noted to have atrial fibrillation with rapid ventricular response.  Subjective   Still short of breath.  A. fib rate improved.  Still complains of upper epigastric pain.  No evidence of bleeding thus far on apixaban.  Inpatient Medications    . apixaban  5 mg Oral BID  . citalopram  20 mg Oral Daily  . diltiazem  240 mg Oral Daily  . feeding supplement  1 Container Oral TID BM  . insulin aspart  0-15 Units Subcutaneous TID AC & HS  . ipratropium-albuterol  3 mL Nebulization Q6H  . magnesium oxide  400 mg Oral Daily  . mometasone-formoterol  2 puff Inhalation BID  . multivitamin with minerals  1 tablet Oral Daily  . potassium chloride  20 mEq Oral BID  . predniSONE  40 mg Oral Q breakfast    Vital Signs    Vitals:   04/24/18 0248 04/24/18 0507 04/24/18 0724 04/24/18 0752  BP:  97/78  97/70  Pulse: (!) 104 100  94  Resp: 16   18  Temp:  97.7 F (36.5 C)  97.7 F (36.5 C)  TempSrc:  Oral  Oral  SpO2: 98% 99% 98% 100%  Weight:      Height:        Intake/Output Summary (Last 24 hours) at 04/24/2018 0807 Last data filed at 04/23/2018 2159 Gross per 24 hour  Intake -  Output 200 ml  Net -200 ml   Filed Weights   04/21/18 0345 04/21/18 0645 04/22/18 1317  Weight: 63 kg (139 lb) 60.2 kg (132 lb 11.5 oz) 64 kg (141 lb 3.2 oz)    Physical Exam    GEN: Well nourished, well developed, in no acute distress.  HEENT: normal.  Neck: Supple, no JVD, carotid bruits, or masses. Cardiac: Regular or irregular rate and rhythm, 1/6 systolic murmur.  No rubs or  gallops. No clubbing, cyanosis, edema.  Radials/DP/PT 2+ and equal bilaterally.  Respiratory:  Respirations regular and unlabored, clear to auscultation bilaterally. GI: Soft but complains of epigastric discomfort.  Nondistended, BS + x 4. MS: no deformity or atrophy. Skin: warm and dry, no rash. Neuro:  Strength and sensation are intact. Psych: Normal affect.  Labs    CBC Recent Labs    04/22/18 0511 04/24/18 0627  WBC 5.3 11.6*  NEUTROABS  --  PENDING  HGB 8.0* 8.6*  HCT 23.9* 25.5*  MCV 102.4* 102.7*  PLT 206 789   Basic Metabolic Panel Recent Labs    04/23/18 0600 04/24/18 0627  NA 133* 129*  K 3.3* 4.5  CL 102 100  CO2 20* 19*  GLUCOSE 146* 120*  BUN 31* 37*  CREATININE 1.54* 1.58*  CALCIUM 6.8* 7.1*  MG 2.2 2.0  PHOS 3.1 3.2   Liver Function Tests Recent Labs    04/22/18 0511 04/23/18 0600  AST  --  107*  ALT  --  78*  ALKPHOS  --  85  BILITOT  --  2.2*  PROT  --  6.6  ALBUMIN 3.0* 3.0*  No results for input(s): LIPASE, AMYLASE in the last 72 hours. Cardiac Enzymes Recent Labs    04/21/18 0939  TROPONINI 0.04*   BNP No results for input(s): BNP in the last 72 hours. D-Dimer No results for input(s): DDIMER in the last 72 hours. Hemoglobin A1C No results for input(s): HGBA1C in the last 72 hours. Fasting Lipid Panel No results for input(s): CHOL, HDL, LDLCALC, TRIG, CHOLHDL, LDLDIRECT in the last 72 hours. Thyroid Function Tests No results for input(s): TSH, T4TOTAL, T3FREE, THYROIDAB in the last 72 hours.  Invalid input(s): FREET3  Telemetry    Atrial fibrillation with controlled ventricular response  ECG    Atrial fibrillation with rapid ventricular response  Radiology    Ct Chest Wo Contrast  Result Date: 04/21/2018 CLINICAL DATA:  62 year old female with a history of abnormal chest x-ray and prior known right-sided lung carcinoma status post treatment EXAM: CT CHEST WITHOUT CONTRAST TECHNIQUE: Multidetector CT imaging of the  chest was performed following the standard protocol without IV contrast. COMPARISON:  Chest x-ray today's date, prior CT 09/12/2017, 03/01/2017, PET-CT 07/13/2016 FINDINGS: Cardiovascular: Cardiac size unchanged. No pericardial fluid/thickening. Dense calcifications of left main, left anterior descending, circumflex, right coronary arteries. Calcifications of the aortic valve. Calcifications of the transverse arch and descending thoracic aorta. No aneurysm. Incidental calcifications of the suprarenal aorta of the upper abdomen. Diameter of the main pulmonary artery unremarkable. Mediastinum/Nodes: Small lymph nodes of the mediastinum, including paratracheal nodes unchanged in number from the comparison CT. Unchanged subcarinal lymph node. Lungs/Pleura: The abnormality on today's chest x-ray corresponds 2 consolidation/architectural distortion and confluent opacity of the right upper lobe which is more pronounced than the comparison CT of 09/12/2017. Volume loss with linear and nodular opacity of the right upper lobe, extends along the peribronchovascular Cher of right upper lobe bronchi to the pleura anteriorly and medially. Nodular changes are inseparable from the hilar structures with volume loss and contraction of the proximal airways of the right upper lobe. Bronchus intermedius and the middle lobe bronchi remain patent. Right lower lobe airways remain patent. No confluent airspace disease. Diameter of the nodularity on image 47 measures 3.5 cm x 2.9 cm, and more inferiorly measures 4.8 cm x 3.5 cm. All of these changes are in the treated area from the prior CT. Left lung demonstrates similar emphysematous changes and scarring at the apex. No new nodule/mass identified on the left. No confluent airspace disease. Small right greater than left pleural effusions, new from the prior. Upper Abdomen: Steatosis. Musculoskeletal: No acute displaced fracture. Degenerative changes of the spine. No new aggressive bony  lesion identified. IMPRESSION: The abnormality of this chest x-ray corresponds to nodular/consolidative changes of the right upper lobe with architectural distortion in the area of prior therapy. 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 Correlation with the patient's prior treatment may be useful, as well as potential future imaging with contrast-enhanced CT chest or alternatively PET-CT. Small lymph nodes of the mediastinum, without increasing lymphadenopathy. Small right greater than left pleural effusion. Emphysema.  Emphysema (ICD10-J43.9). Aortic atherosclerosis with left main and 3 vessel coronary artery disease. Aortic Atherosclerosis (ICD10-I70.0). Hepatic steatosis. Electronically Signed   By: Corrie Mckusick D.O.   On: 04/21/2018 17:28   US Venous Img Lower Bilateral  Result Date: 04/21/2018 CLINICAL DATA:  Bilateral lower extremity pain and edema. History of fall 2 weeks ago. History of breast cancer.  Evaluate for DVT. EXAM: BILATERAL LOWER EXTREMITY VENOUS DOPPLER ULTRASOUND TECHNIQUE: Gray-scale sonography with graded compression, as well as color Doppler and duplex ultrasound were performed to evaluate the lower extremity deep venous systems from the level of the common femoral vein and including the common femoral, femoral, profunda femoral, popliteal and calf veins including the posterior tibial, peroneal and gastrocnemius veins when visible. The superficial great saphenous vein was also interrogated. Spectral Doppler was utilized to evaluate flow at rest and with distal augmentation maneuvers in the common femoral, femoral and popliteal veins. COMPARISON:  None. FINDINGS: RIGHT LOWER EXTREMITY Common Femoral Vein: No evidence of thrombus. Normal compressibility, respiratory phasicity and response to augmentation. Saphenofemoral Junction: No evidence of  thrombus. Normal compressibility and flow on color Doppler imaging. Profunda Femoral Vein: No evidence of thrombus. Normal compressibility and flow on color Doppler imaging. Femoral Vein: No evidence of thrombus. Normal compressibility, respiratory phasicity and response to augmentation. Popliteal Vein: No evidence of thrombus. Normal compressibility, respiratory phasicity and response to augmentation. Calf Veins: No evidence of thrombus. Normal compressibility and flow on color Doppler imaging. Superficial Great Saphenous Vein: No evidence of thrombus. Normal compressibility. Venous Reflux:  None. Other Findings:  None. LEFT LOWER EXTREMITY Common Femoral Vein: No evidence of thrombus. Normal compressibility, respiratory phasicity and response to augmentation. Saphenofemoral Junction: No evidence of thrombus. Normal compressibility and flow on color Doppler imaging. Profunda Femoral Vein: No evidence of thrombus. Normal compressibility and flow on color Doppler imaging. Femoral Vein: No evidence of thrombus. Normal compressibility, respiratory phasicity and response to augmentation. Popliteal Vein: No evidence of thrombus. Normal compressibility, respiratory phasicity and response to augmentation. Calf Veins: No evidence of thrombus. Normal compressibility and flow on color Doppler imaging. Superficial Great Saphenous Vein: No evidence of thrombus. Normal compressibility. Venous Reflux:  None. Other Findings:  None. IMPRESSION: No evidence of DVT within either lower extremity. Electronically Signed   By: Sandi Mariscal M.D.   On: 04/21/2018 14:13   Dg Chest Port 1 View  Result Date: 04/21/2018 CLINICAL DATA:  Initial evaluation for acute shortness of breath, history of COPD. EXAM: PORTABLE CHEST 1 VIEW COMPARISON:  Prior radiograph from 04/19/2017 as well as prior CT from 09/12/2017. FINDINGS: Mild cardiomegaly, stable. Aortic atherosclerosis. Increased masslike density at the right hilum measuring approximately  3.7 x 3.5 cm. Adjacent right perihilar scarring and architectural distortion. No other focal infiltrates. No pleural effusion or pulmonary edema. Chronic bronchitic changes. No pneumothorax. No acute osseous abnormality. IMPRESSION: 1. Increased prominence of 3.5 cm mass like opacity at the right hilum, concerning for worsened malignancy. Further assessment with cross-sectional imaging of the chest suggested for further characterization. 2. Underlying chronic emphysematous changes. 3. Cardiomegaly with aortic atherosclerosis. Electronically Signed   By: Jeannine Boga M.D.   On: 04/21/2018 04:54   US Abdomen Limited Ruq  Result Date: 04/23/2018 CLINICAL DATA:  Elevated bilirubin. EXAM: ULTRASOUND ABDOMEN LIMITED RIGHT UPPER QUADRANT COMPARISON:  PET-CT dated July 13, 2016. FINDINGS: Gallbladder: Contracted. No gallstones or wall thickening visualized. No sonographic Murphy sign noted by sonographer. Common bile duct: Diameter: 6-7 mm, at the upper limits of normal in size. Liver: No focal lesion identified. Diffusely increased in parenchymal echogenicity. 9 mm simple cyst in the right hepatic lobe. Portal vein is patent on color Doppler imaging with normal direction of blood flow towards the liver. IMPRESSION: 1. No acute abnormality. 2. Severe hepatic steatosis. Electronically Signed   By: Titus Dubin M.D.   On: 04/23/2018 16:34    Assessment &  Plan    62 year old female with atrial fibrillation with controlled ventricular response currently with Cardizem.  Still has abdominal discomfort.  Bilirubin is 2.2.  This is down from 4.2 on admission.  Abdominal ultrasound yesterday revealed no acute abnormality with severe hepatic steatosis.  No gallstones were noted.  Patient had been placed on apixaban due to her atrial fibrillation and she had score of 2.  Will need to consider stopping this if invasive procedures are needed with regard to abdominal pain.  No evidence of bleeding at present.  We  will continue with this for now and make further changes based on further work-up.  From a cardiac standpoint, would continue with current meds for rate control and continue with anticoagulation.  Signed, Javier Docker Fath MD 04/24/2018, 8:07 AM  Pager: [phone]

## 2018-04-24 NOTE — Care Management Note (Signed)
Case Management Note  Patient Details  Name: Danielle [name] MRN: [id number] Date of Birth: Sep 11, 1956  Subjective/Objective:    Met with patient to discuss discharge planning. Patient would benefit from home health RN and PT. She declined a HHA. Will need charity care through [facility]. Patient income $1009/month. She does not know her sons income. She will need ha walker and bedside commode. Referral to [facility] and Ordered DME from [facility] with [facility]. Patient has lost her MMM and [place] application. Presented her with a new one. Instructed her to take her prescriptions to [facility] at discharge.  Patient will need an O2 tank for transport home. Ordered from [place] with [facility].                Action/Plan:   Expected Discharge Date:  04/24/18               Expected Discharge Plan:  [address]  In-House Referral:     Discharge planning Services  CM Consult, [facility], Medication Assistance  Post Acute Care Choice:  Durable Medical Equipment, Home Health Choice offered to:  Patient  DME Arranged:  Bedside commode, Walker rolling DME Agency:  [place]:  RN, PT [facility] Agency:  [place]  Status of Service:  Completed, signed off  If discussed at [place] of Stay Meetings, dates discussed:    Additional Comments:  Jolly Mango, RN 04/24/2018, 9:19 AM

## 2018-04-24 NOTE — Progress Notes (Signed)
Pharmacy Electrolyte Monitoring Consult:  Pharmacy consulted to assist in monitoring and replacing electrolytes in this 62 y.o. female admitted on 04/21/2018 with Shortness of Breath  Patient received furosemide 40mg  IV x 1 on 7/8. Patient has received 156mEq or oral potassium and 62mEq of IV potassium on 7/8.   Labs:  Sodium (mmol/L)  Date Value  04/24/2018 129 (L)  02/09/2015 132 (L)   Potassium (mmol/L)  Date Value  04/24/2018 4.5  02/09/2015 3.8   Magnesium (mg/dL)  Date Value  04/24/2018 2.0  12/07/2013 1.8   Phosphorus (mg/dL)  Date Value  04/24/2018 3.2   Calcium (mg/dL)  Date Value  04/24/2018 7.1 (L)   Calcium, Total (mg/dL)  Date Value  02/09/2015 9.1   Albumin (g/dL)  Date Value  04/23/2018 3.0 (L)  02/09/2015 4.3   Corrected Calcium: 7.7   Assessment/Plan: Potassium phosphate 30 mmol IV x 1. Magnesium 2g IV X 1.    Will recheck all electrolytes with am labs.   Patient will need to continue to received oral replacement as tolerated.   04/23/18 06:00 K 3.3, Mg 2.2, phos 3.1, Ca 6.8, adjusted calcium 7.6. Dr. Ubaldo Glassing has already ordered Klor-Con 20 mEq po BID and magnesium oxide 400 mg po daily. Continue those supplements as ordered. Will also give calcium carbonate 500 mg of elemental calcium TID with meals x 3 doses. Recheck all electrolytes tomorrow with AM labs.   04/24/18 06:27 K 4.5, Mg 2, phos 3.2, Ca 7.1, adjusted Ca 7.9. Reduce Klor-con to 20 mEq po once daily, continue magnesium supplement as ordered. Will order additional calcium carbonate BID x 2 doses and recheck all electrolytes tomorrow with AM labs.   Pharmacy will continue to monitor and adjust per consult.   Laural Benes, Pharm.D., BCPS Clinical Pharmacist 04/24/2018 2:01 PM

## 2018-04-24 NOTE — Progress Notes (Signed)
[place] [facility] [place] NAME: Danielle [name]    MR#:  [id number]  DATE OF BIRTH:  1955/11/06  SUBJECTIVE:  CHIEF COMPLAINT:   Chief Complaint  Patient presents with  . Shortness of Breath   Epigastric pain exacerbated by cough and movement. REVIEW OF SYSTEMS:  Review of Systems  Constitutional: Positive for malaise/fatigue. Negative for chills and fever.  HENT: Negative for sore throat.   Eyes: Negative for blurred vision and double vision.  Respiratory: Positive for cough. Negative for hemoptysis, shortness of breath, wheezing and stridor.   Cardiovascular: Negative for chest pain, palpitations, orthopnea and leg swelling.       Bilateral ankle swelling  Gastrointestinal: Positive for abdominal pain. Negative for blood in stool, diarrhea, melena, nausea and vomiting.  Genitourinary: Negative for dysuria, flank pain and hematuria.  Musculoskeletal: Negative for back pain and joint pain.  Skin: Negative for rash.  Neurological: Negative for dizziness, sensory change, focal weakness, seizures, loss of consciousness, weakness and headaches.  Endo/Heme/Allergies: Negative for polydipsia.  Psychiatric/Behavioral: Negative for depression. The patient is not nervous/anxious.     DRUG ALLERGIES:  No Known Allergies VITALS:  Blood pressure 101/72, pulse 91, temperature 98.4 F (36.9 C), temperature source Oral, resp. rate 18, height 5\' 6"  (1.676 m), weight 141 lb 3.2 oz (64 kg), SpO2 98 %. PHYSICAL EXAMINATION:  Physical Exam  Constitutional: She is oriented to person, place, and time. She appears well-developed.  HENT:  Head: Normocephalic.  Mouth/Throat: Oropharynx is clear and moist.  Eyes: Pupils are equal, round, and reactive to light. Conjunctivae and EOM are normal. No scleral icterus.  Neck: Normal range of motion. Neck supple. No JVD present. No tracheal deviation present.  Cardiovascular: Normal rate and normal heart sounds. Exam  reveals no gallop.  No murmur heard. Pulmonary/Chest: Effort normal. No respiratory distress. She has no wheezes. She has no rales.  Diminished lung sounds  Abdominal: Soft. Bowel sounds are normal. She exhibits no distension. There is no tenderness. There is no rebound.  Musculoskeletal: Normal range of motion. She exhibits no edema or tenderness.  Neurological: She is alert and oriented to person, place, and time. No cranial nerve deficit.  Skin: No rash noted. No erythema.  Psychiatric: She has a normal mood and affect.   LABORATORY PANEL:  Female CBC Recent Labs  Lab 04/24/18 0627  WBC 11.6*  HGB 8.6*  HCT 25.5*  PLT 241   ------------------------------------------------------------------------------------------------------------------ Chemistries  Recent Labs  Lab 04/23/18 0600 04/24/18 0627  NA 133* 129*  K 3.3* 4.5  CL 102 100  CO2 20* 19*  GLUCOSE 146* 120*  BUN 31* 37*  CREATININE 1.54* 1.58*  CALCIUM 6.8* 7.1*  MG 2.2 2.0  AST 107*  --   ALT 78*  --   ALKPHOS 85  --   BILITOT 2.2*  --    RADIOLOGY:  No results found. ASSESSMENT AND PLAN:   67F acute hypoxemic respiratory failure, acute COPD exacerbation, dehydration, hypokalemia, hypoglycemia, renal failure, hypocalcemia, hypoalbuminemia, transaminasemia/hyperbilirubinemia, BNP elevation, Troponin-I elevation, macrocytic anemia.  Acute on chronic respiratory failure with hypoxia and hypercapnia due to COPD exacerbation, radiation pneumonitis/fibrosis and CHF. The patient is off BiPAP, on oxygen by nasal cannula 2L (home O2). Continue nebulizer and DuoNeb as needed, complete Zithromax, discontinued IV Solu-Medrol, changed to prednisone.  New onset A. fib with RVR.  The patient was treated with Cardizem IV without improvement.  Cardizem drip was started.  Consider anticoagulation for stroke  prevention per Dr. Ubaldo Glassing.  Echocardiogram showed ejection fraction 35 to 40%. Changed to p.o. Cardizem and started  Eliquis per Dr. Ubaldo Glassing.  Elevated troponin due to demanding ischemia.   Chronic systolic CHF LV EF: 70% -   40%.  Stable. Lasix as needed.  Hypokalemia.  Continue potassium supplement and follow-up level.  Acute renal failure due to ATN and dehydration.   Normal saline IV and follow-up BMP.  Hypomagnesemia and hypophosphatemia.  Improved with supplement.  Moderate Malnutrition related to chronic illness.  Follow dietitian's recommendation.  Abdominal pain, nausea and vomiting.  Nausea vomiting improved.  Abdominal pain is possible due to abdominal wall pain which is exacerbated by cough and movement. Liver function test is unremarkable.  Abdominal ultrasound shows severe hepatic steatosis.   Hyponatremia.  Normal saline IV and follow-up BMP.  The patient need home health and PT. All the records are reviewed and case discussed with Care Management/Social Worker. Management plans discussed with the patient, her daughter and they are in agreement.   CODE STATUS: Full Code  TOTAL TIME TAKING CARE OF THIS PATIENT: 36 minutes.   More than 50% of the time was spent in counseling/coordination of care: YES  POSSIBLE D/C IN 2 DAYS, DEPENDING ON CLINICAL CONDITION.   Demetrios Loll M.D on 04/24/2018 at 5:26 PM  Between 7am to 6pm - Pager - [phone]  After 6pm go to [email or web address] - [facility]

## 2018-04-24 NOTE — Progress Notes (Addendum)
Patient keeps complaining of severe RUQ pain, Dr. Bridgett Larsson made aware. Per Dr. Bridgett Larsson will order morphine. Will continue to monitor.

## 2018-04-24 NOTE — Progress Notes (Signed)
Patient ID: Danielle [name], female   DOB: Mar 01, 1956, 62 y.o.   MRN: [id number]  This NP visited patient at the bedside as a follow up to for palliative medicine needs and emotional support.  Patient saw oncology/Dr Burlene Arnt yesterday and plans to f/u as out patient.  She request a change in nutritional oral  supplement, will ask dietician to revisit.  Danielle [name] does not verbalize any other needs or complaints.  Discussed with her the importance of continued conversation with her family and her medical providers regarding overall plan of care and treatment options,  ensuring decisions are within the context of her values and GOCs.  Questions and concerns addressed   Discussed with Dr Bridgett Larsson    Total time spent on the unit was 15 minutes  Greater than 50% of the time was spent in counseling and coordination of care  Wadie Lessen NP  Palliative Medicine Team Team Phone # [phone] Pager [phone]

## 2018-04-24 NOTE — Progress Notes (Signed)
Pt refused to wear BiPAP mask

## 2018-04-24 NOTE — Plan of Care (Signed)
  Problem: Health Behavior/Discharge Planning: Goal: Ability to manage health-related needs will improve Outcome: Progressing   Problem: Clinical Measurements: Goal: Will remain free from infection Outcome: Progressing   Problem: Activity: Goal: Risk for activity intolerance will decrease Outcome: Progressing   Problem: Skin Integrity: Goal: Risk for impaired skin integrity will decrease Outcome: Progressing

## 2018-04-25 DIAGNOSIS — J441 Chronic obstructive pulmonary disease with (acute) exacerbation: Secondary | ICD-10-CM

## 2018-04-25 LAB — BASIC METABOLIC PANEL
ANION GAP: 11 (ref 5–15)
BUN: 43 mg/dL — ABNORMAL HIGH (ref 8–23)
CO2: 21 mmol/L — ABNORMAL LOW (ref 22–32)
Calcium: 7.9 mg/dL — ABNORMAL LOW (ref 8.9–10.3)
Chloride: 100 mmol/L (ref 98–111)
Creatinine, Ser: 1.77 mg/dL — ABNORMAL HIGH (ref 0.44–1.00)
GFR calc non Af Amer: 29 mL/min — ABNORMAL LOW (ref >60)
GFR, EST AFRICAN AMERICAN: 34 mL/min — AB (ref >60)
Glucose, Bld: 119 mg/dL — ABNORMAL HIGH (ref 70–99)
POTASSIUM: 5.3 mmol/L — AB (ref 3.5–5.1)
SODIUM: 132 mmol/L — AB (ref 135–145)

## 2018-04-25 LAB — GLUCOSE, CAPILLARY
GLUCOSE-CAPILLARY: 141 mg/dL — AB (ref 70–99)
Glucose-Capillary: 101 mg/dL — ABNORMAL HIGH (ref 70–99)
Glucose-Capillary: 158 mg/dL — ABNORMAL HIGH (ref 70–99)

## 2018-04-25 LAB — PHOSPHORUS: PHOSPHORUS: 3.2 mg/dL (ref 2.5–4.6)

## 2018-04-25 LAB — MAGNESIUM: MAGNESIUM: 2.1 mg/dL (ref 1.7–2.4)

## 2018-04-25 MED ORDER — BISACODYL 10 MG RE SUPP
10.0000 mg | Freq: Every day | RECTAL | Status: DC | PRN
  Administered 2018-04-25: 10 mg via RECTAL
  Filled 2018-04-25: qty 1

## 2018-04-25 MED ORDER — GLUCERNA SHAKE PO LIQD
237.0000 mL | Freq: Three times a day (TID) | ORAL | Status: DC
  Administered 2018-04-25 – 2018-04-27 (×6): 237 mL via ORAL

## 2018-04-25 MED ORDER — FLEET ENEMA 7-19 GM/118ML RE ENEM: 1.0000 | ENEMA | Freq: Once | RECTAL | Status: DC

## 2018-04-25 MED ORDER — METOPROLOL TARTRATE 50 MG PO TABS
75.0000 mg | ORAL_TABLET | Freq: Two times a day (BID) | ORAL | Status: AC
  Administered 2018-04-26 – 2018-05-01 (×12): 75 mg via ORAL
  Filled 2018-04-25 (×12): qty 1

## 2018-04-25 NOTE — Progress Notes (Signed)
CRITICAL VALUE ALERT  Critical Value:  K 5.3  Date & Time Notied:  04/25/18.00713  Provider Notified: Dr. Bridgett Larsson  Orders Received/Actions taken: txt paged

## 2018-04-25 NOTE — Progress Notes (Addendum)
[place] [facility] [place] NAME: Danielle [name]    MR#:  [id number]  DATE OF BIRTH:  Jun 08, 1956  SUBJECTIVE:  CHIEF COMPLAINT:   Chief Complaint  Patient presents with  . Shortness of Breath   Abdominal pain pain and distention. REVIEW OF SYSTEMS:  Review of Systems  Constitutional: Positive for malaise/fatigue. Negative for chills and fever.  HENT: Negative for sore throat.   Eyes: Negative for blurred vision and double vision.  Respiratory: Positive for cough. Negative for hemoptysis, shortness of breath, wheezing and stridor.   Cardiovascular: Negative for chest pain, palpitations, orthopnea and leg swelling.       Bilateral ankle swelling  Gastrointestinal: Positive for abdominal pain and constipation. Negative for blood in stool, diarrhea, melena, nausea and vomiting.  Genitourinary: Negative for dysuria, flank pain and hematuria.  Musculoskeletal: Negative for back pain and joint pain.  Skin: Negative for rash.  Neurological: Negative for dizziness, sensory change, focal weakness, seizures, loss of consciousness, weakness and headaches.  Endo/Heme/Allergies: Negative for polydipsia.  Psychiatric/Behavioral: Negative for depression. The patient is not nervous/anxious.     DRUG ALLERGIES:  No Known Allergies VITALS:  Blood pressure (!) 136/94, pulse 95, temperature 97.8 F (36.6 C), temperature source Oral, resp. rate 20, height 5\' 6"  (1.676 m), weight 147 lb 3.2 oz (66.8 kg), SpO2 100 %. PHYSICAL EXAMINATION:  Physical Exam  Constitutional: She is oriented to person, place, and time. She appears well-developed.  HENT:  Head: Normocephalic.  Mouth/Throat: Oropharynx is clear and moist.  Eyes: Pupils are equal, round, and reactive to light. Conjunctivae and EOM are normal. No scleral icterus.  Neck: Normal range of motion. Neck supple. No JVD present. No tracheal deviation present.  Cardiovascular: Normal rate and normal heart  sounds. Exam reveals no gallop.  No murmur heard. Pulmonary/Chest: Effort normal. No respiratory distress. She has no wheezes. She has no rales.  Diminished lung sounds  Abdominal: Soft. Bowel sounds are normal. She exhibits distension. There is tenderness. There is no rebound.  Musculoskeletal: Normal range of motion. She exhibits no edema or tenderness.  Neurological: She is alert and oriented to person, place, and time. No cranial nerve deficit.  Skin: No rash noted. No erythema.  Psychiatric: She has a normal mood and affect.   LABORATORY PANEL:  Female CBC Recent Labs  Lab 04/24/18 0627  WBC 11.6*  HGB 8.6*  HCT 25.5*  PLT 241   ------------------------------------------------------------------------------------------------------------------ Chemistries  Recent Labs  Lab 04/23/18 0600  04/25/18 0439  NA 133*   < > 132*  K 3.3*   < > 5.3*  CL 102   < > 100  CO2 20*   < > 21*  GLUCOSE 146*   < > 119*  BUN 31*   < > 43*  CREATININE 1.54*   < > 1.77*  CALCIUM 6.8*   < > 7.9*  MG 2.2   < > 2.1  AST 107*  --   --   ALT 78*  --   --   ALKPHOS 85  --   --   BILITOT 2.2*  --   --    < > = values in this interval not displayed.   RADIOLOGY:  No results found. ASSESSMENT AND PLAN:   25F acute hypoxemic respiratory failure, acute COPD exacerbation, dehydration, hypokalemia, hypoglycemia, renal failure, hypocalcemia, hypoalbuminemia, transaminasemia/hyperbilirubinemia, BNP elevation, Troponin-I elevation, macrocytic anemia.  Acute on chronic respiratory failure with hypoxia and hypercapnia due to COPD  exacerbation, radiation pneumonitis/fibrosis and CHF. The patient is off BiPAP, on oxygen by nasal cannula 2L (home O2). Continue nebulizer and DuoNeb as needed, complete Zithromax, discontinued IV Solu-Medrol, changed to prednisone.  New onset A. fib with RVR.  The patient was treated with Cardizem IV without improvement.  Cardizem drip was started.  Consider anticoagulation  for stroke prevention per Dr. Ubaldo Glassing.  Echocardiogram showed ejection fraction 35 to 40%. Changed to p.o. Cardizem and started Eliquis per Dr. Ubaldo Glassing. Cardizem was discontinued and Lopressor was started by Dr. Ubaldo Glassing.  Elevated troponin due to demanding ischemia.   Chronic systolic CHF LV EF: 29% -   40%.  Stable. Lasix as needed.  Hypokalemia.  Continue potassium supplement and improved. Hyperkalemia.  Hold potassium supplement.  IV fluid support and follow-up K.  Acute renal failure due to ATN and dehydration.  Worsening. Continue normal saline IV and follow-up BMP.  Hypomagnesemia and hypophosphatemia.  Improved with supplement.  Moderate Malnutrition related to chronic illness.  Follow dietitian's recommendation.  Abdominal pain. Abdominal pain is possible due to abdominal wall pain which is exacerbated by cough and movement.  Also possible due to constipation. Dulcolax suppository and enema as needed.  Liver function test is unremarkable.  Abdominal ultrasound shows severe hepatic steatosis.   Hyponatremia.  Normal saline IV and follow-up BMP.  Stage I x2 lung cancer status post radiation; remote history of breast cancer.  Follow-up of Dr. Rogue Bussing as outpatient.  The patient need home health and PT. All the records are reviewed and case discussed with Care Management/Social Worker. Management plans discussed with the patient, her daughter and they are in agreement.   CODE STATUS: Full Code  TOTAL TIME TAKING CARE OF THIS PATIENT: 36 minutes.   More than 50% of the time was spent in counseling/coordination of care: YES  POSSIBLE D/C IN 2 DAYS, DEPENDING ON CLINICAL CONDITION.   Demetrios Loll M.D on 04/25/2018 at 1:47 PM  Between 7am to 6pm - Pager - [phone]  After 6pm go to [email or web address] - [facility]

## 2018-04-25 NOTE — Progress Notes (Signed)
PT Cancellation Note  Patient Details Name: Danielle [name] MRN: [id number] DOB: [birth year]   Cancelled Treatment:     Order received. Chart reviewed. PT attempted to see pt this date however due to 10/10 pain and severe nausea with any movements pt chooses not to complete eval at this time. PT plans to complete order at this time due to pt continuing to be inappropriate for therapy upon arrival. If pt has further PT needs please indicate so through a new order.  Yolonda Kida, SPT    Shakeia Krus 04/25/2018, 10:36 AM

## 2018-04-25 NOTE — Progress Notes (Signed)
Patient dry cough changed to non productive congested cough. Expiratory wheezing on exam. Md notified. IV fluids discontinued. Patient complaining of constant abdominal pain exacerbated by coughing. Medicated as needed with only minor relief. MD notified. Acknowledged. No new orders. Will monitor

## 2018-04-25 NOTE — Progress Notes (Signed)
Nutrition Follow Up Note    DOCUMENTATION CODES:   Non-severe (moderate) malnutrition in context of chronic illness  INTERVENTION:   Pt noted to have a macrocytic anemia; would recommend check B12 lab to r/o deficiency.     D/C Boost Breeze po TID, each supplement provides 250 kcal and 9 grams of protein.  Add Glucerna Shake po TID, each supplement provides 220 kcal and 10 grams of protein  Provide daily MVI  Bowel regimen per MD  NUTRITION DIAGNOSIS:   Moderate Malnutrition related to chronic illness(hx breast cancer, CHF, COPD) as evidenced by mild fat depletion, moderate fat depletion, mild muscle depletion, moderate muscle depletion.  GOAL:   Patient will meet greater than or equal to 90% of their needs  -progressing  MONITOR:   PO intake, Supplement acceptance, Labs, Weight trends, I & O's  ASSESSMENT:   62 year old female with PMHx of arthritis, HTN, COPD, anxiety, depression, CHF, breast cancer s/p left mastectomy and chemotherapy/XRT, lymphedema who is now admitted with severe hypoxic and hypercapnic respiratory failure initially requiring BiPAP, CHF exacerbation, radiation pneumonitis/fibrosis.   Met with pt in room today. Pt reports continued poor appetite and oral intake r/t "my throat burns". Pt reports that is hurts her throat when she swallows so she is unable to eat her food. Pt ate 25% of her breakfast tray this morning. Pt reports that she does not like the [employer]. Pt previously reported that she did not like Ensure. RD discussed the importance of adequate protein intake needed to preserve lean muscle. Pt is willing to try a vanilla or butter pecan supplements; RD will try Glucerna. Per chart, pt with 15lb weight gain since admit; pt is noted to have some mild edema. Pt reports constipation today; recommend bowel regimen per MD. Damaris Schooner to RN, pt was given something for heartburn today. Pt is noted to have a macrocytic anemia; would recommend check B12 lab to  r/o deficiency.     Medications reviewed and include: celexa, insulin, Mg oxide, MVI, NaCl _0 /hr, dulolax, morphine  Labs reviewed: Na 132(L), K 5.3(H), BUN 43(H), creat 1.77(H), Ca 7.9(L), P 3.2 wnl, Mg 2.1 wnl Wbc- 11.6(H), Hgb 8.6(L), Hct 25.5(L), MVC 102.7(H), MCH 34.7(H)- 7/11 cbgs- 120, 119 x 24 hrs  Diet Order:   Diet Order           Diet regular Room service appropriate? Yes; Fluid consistency: Thin  Diet effective now        Diet - low sodium heart healthy          EDUCATION NEEDS:   Education needs have been addressed  Skin:  Skin Assessment: Reviewed RN Assessment  Last BM:  7/10 -type 2  Height:   Ht Readings from Last 1 Encounters:  04/22/18 _1  (1.676 m)    Weight:   Wt Readings from Last 1 Encounters:  04/25/18 147 lb 3.2 oz (66.8 kg)    Ideal Body Weight:  59.1 kg  BMI:  Body mass index is 23.76 kg/m.  Estimated Nutritional Needs:   Kcal:  1505-1805 (25-30 kcal/kg)  Protein:  70-85 grams (1.2-1.4 grams/kg)  Fluid:  1.5-1.8 L/day (1 mL/kcal)  [name] MS, RD, LDN Pager #- [phone] Office#- [phone] After Hours Pager: [phone]

## 2018-04-25 NOTE — Progress Notes (Signed)
Spoke with Dr. Ubaldo Glassing about pt reduced EF and the use of diltiazem. Discussed switching patient to metoprolol for rate control and HF evidence based medicine. Dr. Ubaldo Glassing in agreement. Will d/c diltiazem and start metoprolol tartrate 75mg  BID. Will have to start tomorrow as pt already received diltiazem this AM. Starting tartrate just to make sure pt tolerates total metoprolol dose. Her total daily dose should be consolidated on discharge and she should be discharged on SUCCINATE (XL) daily for evidence based medicine. If her renal function stabilizes, I recommend her ARB be resumed at discharge as well.  Ramond Dial, Pharm.D, BCPS Clinical Pharmacist

## 2018-04-25 NOTE — Progress Notes (Signed)
Pharmacy Electrolyte Monitoring Consult:  Pharmacy consulted to assist in monitoring and replacing electrolytes in this 62 y.o. female admitted on 04/21/2018 with Shortness of Breath  Patient received furosemide 40mg  IV x 1 on 7/8. Patient has received 1103mEq or oral potassium and 30mEq of IV potassium on 7/8.   Labs:  Sodium (mmol/L)  Date Value  04/25/2018 132 (L)  02/09/2015 132 (L)   Potassium (mmol/L)  Date Value  04/25/2018 5.3 (H)  02/09/2015 3.8   Magnesium (mg/dL)  Date Value  04/25/2018 2.1  12/07/2013 1.8   Phosphorus (mg/dL)  Date Value  04/25/2018 3.2   Calcium (mg/dL)  Date Value  04/25/2018 7.9 (L)   Calcium, Total (mg/dL)  Date Value  02/09/2015 9.1   Albumin (g/dL)  Date Value  04/23/2018 3.0 (L)  02/09/2015 4.3   Corrected Calcium: 7.7   Assessment/Plan: Potassium phosphate 30 mmol IV x 1. Magnesium 2g IV X 1.    Will recheck all electrolytes with am labs.   Patient will need to continue to received oral replacement as tolerated.   04/23/18 06:00 K 3.3, Mg 2.2, phos 3.1, Ca 6.8, adjusted calcium 7.6. Dr. Ubaldo Glassing has already ordered Klor-Con 20 mEq po BID and magnesium oxide 400 mg po daily. Continue those supplements as ordered. Will also give calcium carbonate 500 mg of elemental calcium TID with meals x 3 doses. Recheck all electrolytes tomorrow with AM labs.   04/24/18 06:27 K 4.5, Mg 2, phos 3.2, Ca 7.1, adjusted Ca 7.9. Reduce Klor-con to 20 mEq po once daily, continue magnesium supplement as ordered. Will order additional calcium carbonate BID x 2 doses and recheck all electrolytes tomorrow with AM labs.   04/25/2018 04:L39 K 5.3, Ca 7.9, adjusted Ca 8.7, Mg 2.1, phos 3.2. Stop Klor-Con. No further supplement at this time. Recheck electrolytes tomorrow with AM labs.   Pharmacy will continue to monitor and adjust per consult.   Laural Benes, Pharm.D., BCPS Clinical Pharmacist 04/25/2018 7:47 AM

## 2018-04-25 NOTE — Progress Notes (Signed)
Patient Name: Danielle [name] Date of Encounter: 04/25/2018  Hospital Problem List     Active Problems:   Malignant neoplasm of upper lobe of right lung (HCC)   Acute hypoxemic respiratory failure (HCC)   Malnutrition of moderate degree   Palliative care by specialist   DNR (do not resuscitate) discussion   Weakness generalized   COPD exacerbation [facility])    Patient Profile     Patient with malignant neoplasm of the upper lobe of the right lung admitted with hypoxia and A. fib with rapid ventricular response.  Currently converted to sinus rhythm.  On Cardizem CD and Eliquis.  Subjective   Still complains of abdominal discomfort  Inpatient Medications    . apixaban  5 mg Oral BID  . calcium carbonate  500 mg of elemental calcium Oral BID WC  . citalopram  20 mg Oral Daily  . diltiazem  240 mg Oral Daily  . feeding supplement  1 Container Oral TID BM  . insulin aspart  0-15 Units Subcutaneous TID AC & HS  . ipratropium-albuterol  3 mL Nebulization Q6H  . magnesium oxide  400 mg Oral Daily  . mometasone-formoterol  2 puff Inhalation BID  . multivitamin with minerals  1 tablet Oral Daily  . predniSONE  40 mg Oral Q breakfast    Vital Signs    Vitals:   04/24/18 1941 04/24/18 2016 04/25/18 0218 04/25/18 0357  BP: 113/84   115/81  Pulse: 93   90  Resp: 18   18  Temp: 98.1 F (36.7 C)   98 F (36.7 C)  TempSrc: Oral   Oral  SpO2: 99% 97% 96% 99%  Weight:    66.8 kg (147 lb 3.2 oz)  Height:        Intake/Output Summary (Last 24 hours) at 04/25/2018 0818 Last data filed at 04/25/2018 0357 Gross per 24 hour  Intake 0 ml  Output 100 ml  Net -100 ml   Filed Weights   04/21/18 0645 04/22/18 1317 04/25/18 0357  Weight: 60.2 kg (132 lb 11.5 oz) 64 kg (141 lb 3.2 oz) 66.8 kg (147 lb 3.2 oz)    Physical Exam    GEN: Well nourished, well developed, in no acute distress.  HEENT: normal.  Neck: Supple, no JVD, carotid bruits, or masses. Cardiac: RRR, no murmurs,  rubs, or gallops. No clubbing, cyanosis, edema.  Radials/DP/PT 2+ and equal bilaterally.  Respiratory:  Respirations regular and unlabored, clear to auscultation bilaterally. GI: Soft but with epigastric discomfort.  BS + x 4. MS: no deformity or atrophy. Skin: warm and dry, no rash. Neuro:  Strength and sensation are intact. Psych: Normal affect.  Labs    CBC Recent Labs    04/24/18 0627  WBC 11.6*  NEUTROABS 10.3*  HGB 8.6*  HCT 25.5*  MCV 102.7*  PLT 132   Basic Metabolic Panel Recent Labs    04/24/18 0627 04/25/18 0439  NA 129* 132*  K 4.5 5.3*  CL 100 100  CO2 19* 21*  GLUCOSE 120* 119*  BUN 37* 43*  CREATININE 1.58* 1.77*  CALCIUM 7.1* 7.9*  MG 2.0 2.1  PHOS 3.2 3.2   Liver Function Tests Recent Labs    04/23/18 0600  AST 107*  ALT 78*  ALKPHOS 85  BILITOT 2.2*  PROT 6.6  ALBUMIN 3.0*   No results for input(s): LIPASE, AMYLASE in the last 72 hours. Cardiac Enzymes No results for input(s): CKTOTAL, CKMB, CKMBINDEX, TROPONINI in the last  72 hours. BNP No results for input(s): BNP in the last 72 hours. D-Dimer No results for input(s): DDIMER in the last 72 hours. Hemoglobin A1C No results for input(s): HGBA1C in the last 72 hours. Fasting Lipid Panel No results for input(s): CHOL, HDL, LDLCALC, TRIG, CHOLHDL, LDLDIRECT in the last 72 hours. Thyroid Function Tests No results for input(s): TSH, T4TOTAL, T3FREE, THYROIDAB in the last 72 hours.  Invalid input(s): FREET3  Telemetry    A. fib converted to sinus rhythm currently  ECG      Radiology    Ct Chest Wo Contrast  Result Date: 04/21/2018 CLINICAL DATA:  62 year old female with a history of abnormal chest x-ray and prior known right-sided lung carcinoma status post treatment EXAM: CT CHEST WITHOUT CONTRAST TECHNIQUE: Multidetector CT imaging of the chest was performed following the standard protocol without IV contrast. COMPARISON:  Chest x-ray today's date, prior CT 09/12/2017,  03/01/2017, PET-CT 07/13/2016 FINDINGS: Cardiovascular: Cardiac size unchanged. No pericardial fluid/thickening. Dense calcifications of left main, left anterior descending, circumflex, right coronary arteries. Calcifications of the aortic valve. Calcifications of the transverse arch and descending thoracic aorta. No aneurysm. Incidental calcifications of the suprarenal aorta of the upper abdomen. Diameter of the main pulmonary artery unremarkable. Mediastinum/Nodes: Small lymph nodes of the mediastinum, including paratracheal nodes unchanged in number from the comparison CT. Unchanged subcarinal lymph node. Lungs/Pleura: The abnormality on today's chest x-ray corresponds 2 consolidation/architectural distortion and confluent opacity of the right upper lobe which is more pronounced than the comparison CT of 09/12/2017. Volume loss with linear and nodular opacity of the right upper lobe, extends along the peribronchovascular Cher of right upper lobe bronchi to the pleura anteriorly and medially. Nodular changes are inseparable from the hilar structures with volume loss and contraction of the proximal airways of the right upper lobe. Bronchus intermedius and the middle lobe bronchi remain patent. Right lower lobe airways remain patent. No confluent airspace disease. Diameter of the nodularity on image 47 measures 3.5 cm x 2.9 cm, and more inferiorly measures 4.8 cm x 3.5 cm. All of these changes are in the treated area from the prior CT. Left lung demonstrates similar emphysematous changes and scarring at the apex. No new nodule/mass identified on the left. No confluent airspace disease. Small right greater than left pleural effusions, new from the prior. Upper Abdomen: Steatosis. Musculoskeletal: No acute displaced fracture. Degenerative changes of the spine. No new aggressive bony lesion identified. IMPRESSION: The abnormality of this chest x-ray corresponds to nodular/consolidative changes of the right upper lobe  with architectural distortion in the area of prior therapy. 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 Correlation with the patient's prior treatment may be useful, as well as potential future imaging with contrast-enhanced CT chest or alternatively PET-CT. Small lymph nodes of the mediastinum, without increasing lymphadenopathy. Small right greater than left pleural effusion. Emphysema.  Emphysema (ICD10-J43.9). Aortic atherosclerosis with left main and 3 vessel coronary artery disease. Aortic Atherosclerosis (ICD10-I70.0). Hepatic steatosis. Electronically Signed   By: Corrie Mckusick D.O.   On: 04/21/2018 17:28   US Venous Img Lower Bilateral  Result Date: 04/21/2018 CLINICAL DATA:  Bilateral lower extremity pain and edema. History of fall 2 weeks ago. History of breast cancer. Evaluate for DVT. EXAM: BILATERAL LOWER EXTREMITY VENOUS DOPPLER ULTRASOUND TECHNIQUE: Gray-scale sonography with graded compression, as well as color Doppler and duplex ultrasound were performed to  evaluate the lower extremity deep venous systems from the level of the common femoral vein and including the common femoral, femoral, profunda femoral, popliteal and calf veins including the posterior tibial, peroneal and gastrocnemius veins when visible. The superficial great saphenous vein was also interrogated. Spectral Doppler was utilized to evaluate flow at rest and with distal augmentation maneuvers in the common femoral, femoral and popliteal veins. COMPARISON:  None. FINDINGS: RIGHT LOWER EXTREMITY Common Femoral Vein: No evidence of thrombus. Normal compressibility, respiratory phasicity and response to augmentation. Saphenofemoral Junction: No evidence of thrombus. Normal compressibility and flow on color Doppler imaging. Profunda Femoral Vein: No evidence of thrombus. Normal  compressibility and flow on color Doppler imaging. Femoral Vein: No evidence of thrombus. Normal compressibility, respiratory phasicity and response to augmentation. Popliteal Vein: No evidence of thrombus. Normal compressibility, respiratory phasicity and response to augmentation. Calf Veins: No evidence of thrombus. Normal compressibility and flow on color Doppler imaging. Superficial Great Saphenous Vein: No evidence of thrombus. Normal compressibility. Venous Reflux:  None. Other Findings:  None. LEFT LOWER EXTREMITY Common Femoral Vein: No evidence of thrombus. Normal compressibility, respiratory phasicity and response to augmentation. Saphenofemoral Junction: No evidence of thrombus. Normal compressibility and flow on color Doppler imaging. Profunda Femoral Vein: No evidence of thrombus. Normal compressibility and flow on color Doppler imaging. Femoral Vein: No evidence of thrombus. Normal compressibility, respiratory phasicity and response to augmentation. Popliteal Vein: No evidence of thrombus. Normal compressibility, respiratory phasicity and response to augmentation. Calf Veins: No evidence of thrombus. Normal compressibility and flow on color Doppler imaging. Superficial Great Saphenous Vein: No evidence of thrombus. Normal compressibility. Venous Reflux:  None. Other Findings:  None. IMPRESSION: No evidence of DVT within either lower extremity. Electronically Signed   By: Sandi Mariscal M.D.   On: 04/21/2018 14:13   Dg Chest Port 1 View  Result Date: 04/21/2018 CLINICAL DATA:  Initial evaluation for acute shortness of breath, history of COPD. EXAM: PORTABLE CHEST 1 VIEW COMPARISON:  Prior radiograph from 04/19/2017 as well as prior CT from 09/12/2017. FINDINGS: Mild cardiomegaly, stable. Aortic atherosclerosis. Increased masslike density at the right hilum measuring approximately 3.7 x 3.5 cm. Adjacent right perihilar scarring and architectural distortion. No other focal infiltrates. No pleural effusion  or pulmonary edema. Chronic bronchitic changes. No pneumothorax. No acute osseous abnormality. IMPRESSION: 1. Increased prominence of 3.5 cm mass like opacity at the right hilum, concerning for worsened malignancy. Further assessment with cross-sectional imaging of the chest suggested for further characterization. 2. Underlying chronic emphysematous changes. 3. Cardiomegaly with aortic atherosclerosis. Electronically Signed   By: Jeannine Boga M.D.   On: 04/21/2018 04:54   US Abdomen Limited Ruq  Result Date: 04/23/2018 CLINICAL DATA:  Elevated bilirubin. EXAM: ULTRASOUND ABDOMEN LIMITED RIGHT UPPER QUADRANT COMPARISON:  PET-CT dated July 13, 2016. FINDINGS: Gallbladder: Contracted. No gallstones or wall thickening visualized. No sonographic Murphy sign noted by sonographer. Common bile duct: Diameter: 6-7 mm, at the upper limits of normal in size. Liver: No focal lesion identified. Diffusely increased in parenchymal echogenicity. 9 mm simple cyst in the right hepatic lobe. Portal vein is patent on color Doppler imaging with normal direction of blood flow towards the liver. IMPRESSION: 1. No acute abnormality. 2. Severe hepatic steatosis. Electronically Signed   By: Titus Dubin M.D.   On: 04/23/2018 16:34    Assessment & Plan    Patient with lung carcinoma admitted with shortness of breath and abdominal pain.  Initially in A. fib with rapid ventricular response.  Has been placed on Eliquis and Cardizem.  Appears to have converted to sinus rhythm.  We will continue with Eliquis follow for evidence of bleeding.  Continue with Cardizem and follow rhythm and rate.  Creatinine mildly elevated.  We may need to hydrate gently.  EF 35 to 40%.  Signed, Javier Docker Abelardo Seidner MD 04/25/2018, 8:18 AM  Pager: [phone]

## 2018-04-25 NOTE — Plan of Care (Signed)
Rounded with MD, pt keeps complaining of RUQ pain, medicated with Morphine x1, Dulcolax suppository given, pt had a bm, per patient pain is subsiding. Will continue to monitor.   Problem: Health Behavior/Discharge Planning: Goal: Ability to manage health-related needs will improve Outcome: Progressing   Problem: Elimination: Goal: Will not experience complications related to bowel motility Outcome: Progressing

## 2018-04-25 NOTE — Care Management (Addendum)
Confirmed with Brad of [facility] that bedside commode and walker has been provided and will provide RN and PT. Portable 02 tank has been delivered to patient's room. Unable to identify the specific discharge meds so could be obtained prior to weekend.  may need MATCH

## 2018-04-26 LAB — BASIC METABOLIC PANEL
Anion gap: 6 (ref 5–15)
BUN: 43 mg/dL — AB (ref 8–23)
CALCIUM: 8.6 mg/dL — AB (ref 8.9–10.3)
CO2: 23 mmol/L (ref 22–32)
Chloride: 104 mmol/L (ref 98–111)
Creatinine, Ser: 1.33 mg/dL — ABNORMAL HIGH (ref 0.44–1.00)
GFR calc Af Amer: 48 mL/min — ABNORMAL LOW (ref >60)
GFR, EST NON AFRICAN AMERICAN: 41 mL/min — AB (ref >60)
Glucose, Bld: 127 mg/dL — ABNORMAL HIGH (ref 70–99)
Potassium: 5.8 mmol/L — ABNORMAL HIGH (ref 3.5–5.1)
SODIUM: 133 mmol/L — AB (ref 135–145)

## 2018-04-26 LAB — GLUCOSE, CAPILLARY
GLUCOSE-CAPILLARY: 203 mg/dL — AB (ref 70–99)
GLUCOSE-CAPILLARY: 23 mg/dL — AB (ref 70–99)
GLUCOSE-CAPILLARY: 92 mg/dL (ref 70–99)
Glucose-Capillary: 119 mg/dL — ABNORMAL HIGH (ref 70–99)
Glucose-Capillary: 104 mg/dL — ABNORMAL HIGH (ref 70–99)
Glucose-Capillary: 197 mg/dL — ABNORMAL HIGH (ref 70–99)
Glucose-Capillary: 38 mg/dL — CL (ref 70–99)

## 2018-04-26 LAB — POTASSIUM
Potassium: 5.9 mmol/L — ABNORMAL HIGH (ref 3.5–5.1)
Potassium: 5.3 mmol/L — ABNORMAL HIGH (ref 3.5–5.1)

## 2018-04-26 LAB — MAGNESIUM: MAGNESIUM: 2.1 mg/dL (ref 1.7–2.4)

## 2018-04-26 LAB — PHOSPHORUS: Phosphorus: 3.3 mg/dL (ref 2.5–4.6)

## 2018-04-26 MED ORDER — DEXTROSE 50 % IV SOLN
INTRAVENOUS | Status: AC
  Administered 2018-04-26: 50 mL
  Filled 2018-04-26: qty 50

## 2018-04-26 MED ORDER — CALCIUM GLUCONATE 10 % IV SOLN
1.0000 g | Freq: Once | INTRAVENOUS | Status: AC
  Administered 2018-04-26: 1 g via INTRAVENOUS
  Filled 2018-04-26: qty 10

## 2018-04-26 MED ORDER — DEXTROSE 50 % IV SOLN
25.0000 mL | Freq: Once | INTRAVENOUS | Status: AC
  Administered 2018-04-26: 25 mL via INTRAVENOUS
  Filled 2018-04-26: qty 50

## 2018-04-26 MED ORDER — GLUCAGON HCL RDNA (DIAGNOSTIC) 1 MG IJ SOLR
INTRAMUSCULAR | Status: AC
  Filled 2018-04-26: qty 1

## 2018-04-26 MED ORDER — SODIUM ZIRCONIUM CYCLOSILICATE 5 G PO PACK
5.0000 g | PACK | Freq: Once | ORAL | Status: AC
  Administered 2018-04-26: 5 g via ORAL
  Filled 2018-04-26: qty 1

## 2018-04-26 MED ORDER — INSULIN ASPART 100 UNIT/ML IV SOLN
10.0000 [IU] | Freq: Once | INTRAVENOUS | Status: AC
  Administered 2018-04-26: 10 [IU] via INTRAVENOUS
  Filled 2018-04-26: qty 0.1

## 2018-04-26 MED ORDER — METOPROLOL SUCCINATE ER 50 MG PO TB24: 150.0000 mg | ORAL_TABLET | Freq: Every day | ORAL | 0 refills | Status: DC

## 2018-04-26 MED ORDER — METOPROLOL TARTRATE 75 MG PO TABS: 75.0000 mg | ORAL_TABLET | Freq: Two times a day (BID) | ORAL | 0 refills | Status: DC

## 2018-04-26 NOTE — Care Management Note (Signed)
Case Management Note  Patient Details  Name: Danielle [name] MRN: [id number] Date of Birth: 1956/09/28  Subjective/Objective:  Patient to be discharged per MD order. Orders in place for home health services. Previous care manager worked patient up for charity care services via [facility]. In addition, oxygen and bedside commode were provided. Notified Jermaine from [facility] of previous referral. Patient discharging on new medications. In total, with coupons/vouchers total price of medications is around $20. Two new inhalers will also go home with patient. Patient agrees to this price and will attempt entry into the medication management/open door clinic once discharged. Eliquis voucher given. Family to provide transport.                   Action/Plan:   Expected Discharge Date:  04/26/18               Expected Discharge Plan:  [place]  In-House Referral:     Discharge planning Services  CM Consult, [facility], Medication Assistance  Post Acute Care Choice:  Durable Medical Equipment, Home Health Choice offered to:  Patient  DME Arranged:  Bedside commode, Walker rolling DME Agency:  [place]:  RN, PT [facility] Agency:  [place]  Status of Service:  Completed, signed off  If discussed at [place] of Stay Meetings, dates discussed:    Additional Comments:  Shamar Kracke A Jahel Wavra, RN 04/26/2018, 9:22 AM

## 2018-04-26 NOTE — Progress Notes (Signed)
[place] at [place] NAME: Danielle [name]    MR#:  [id number]  DATE OF BIRTH:  Feb 12, 1956  SUBJECTIVE:  CHIEF COMPLAINT:   Chief Complaint  Patient presents with  . Shortness of Breath   RUQ abdominal pain pain and better distention. K 5.8 this am. REVIEW OF SYSTEMS:  Review of Systems  Constitutional: Positive for malaise/fatigue. Negative for chills and fever.  HENT: Negative for sore throat.   Eyes: Negative for blurred vision and double vision.  Respiratory: Negative for cough, hemoptysis, shortness of breath, wheezing and stridor.   Cardiovascular: Negative for chest pain, palpitations, orthopnea and leg swelling.       Bilateral ankle swelling  Gastrointestinal: Positive for abdominal pain. Negative for blood in stool, constipation, diarrhea, melena, nausea and vomiting.  Genitourinary: Negative for dysuria, flank pain and hematuria.  Musculoskeletal: Negative for back pain and joint pain.  Skin: Negative for rash.  Neurological: Negative for dizziness, sensory change, focal weakness, seizures, loss of consciousness, weakness and headaches.  Endo/Heme/Allergies: Negative for polydipsia.  Psychiatric/Behavioral: Negative for depression. The patient is not nervous/anxious.     DRUG ALLERGIES:  No Known Allergies VITALS:  Blood pressure 132/79, pulse 87, temperature (!) 97.5 F (36.4 C), temperature source Oral, resp. rate 16, height 5\' 6"  (1.676 m), weight 146 lb 3.2 oz (66.3 kg), SpO2 100 %. PHYSICAL EXAMINATION:  Physical Exam  Constitutional: She is oriented to person, place, and time. She appears well-developed.  HENT:  Head: Normocephalic.  Mouth/Throat: Oropharynx is clear and moist.  Eyes: Pupils are equal, round, and reactive to light. Conjunctivae and EOM are normal. No scleral icterus.  Neck: Normal range of motion. Neck supple. No JVD present. No tracheal deviation present.  Cardiovascular: Normal rate and normal  heart sounds. Exam reveals no gallop.  No murmur heard. Pulmonary/Chest: Effort normal. No respiratory distress. She has no wheezes. She has no rales.  Diminished lung sounds  Abdominal: Soft. Bowel sounds are normal. She exhibits distension. There is tenderness. There is no rebound.  Musculoskeletal: Normal range of motion. She exhibits no edema or tenderness.  Neurological: She is alert and oriented to person, place, and time. No cranial nerve deficit.  Skin: No rash noted. No erythema.  Psychiatric: She has a normal mood and affect.   LABORATORY PANEL:  Female CBC Recent Labs  Lab 04/24/18 0627  WBC 11.6*  HGB 8.6*  HCT 25.5*  PLT 241   ------------------------------------------------------------------------------------------------------------------ Chemistries  Recent Labs  Lab 04/23/18 0600  04/26/18 0546 04/26/18 1401  NA 133*   < > 133*  --   K 3.3*   < > 5.8* 5.9*  CL 102   < > 104  --   CO2 20*   < > 23  --   GLUCOSE 146*   < > 127*  --   BUN 31*   < > 43*  --   CREATININE 1.54*   < > 1.33*  --   CALCIUM 6.8*   < > 8.6*  --   MG 2.2   < > 2.1  --   AST 107*  --   --   --   ALT 78*  --   --   --   ALKPHOS 85  --   --   --   BILITOT 2.2*  --   --   --    < > = values in this interval not displayed.   RADIOLOGY:  No  results found. ASSESSMENT AND PLAN:   23F acute hypoxemic respiratory failure, acute COPD exacerbation, dehydration, hypokalemia, hypoglycemia, renal failure, hypocalcemia, hypoalbuminemia, transaminasemia/hyperbilirubinemia, BNP elevation, Troponin-I elevation, macrocytic anemia.  Acute on chronic respiratory failure with hypoxia and hypercapnia due to COPD exacerbation, radiation pneumonitis/fibrosis and CHF. The patient is off BiPAP, on oxygen by nasal cannula 2L (home O2). Continue nebulizer and DuoNeb as needed, complete Zithromax, discontinued IV Solu-Medrol, changed to prednisone.  New onset A. fib with RVR. The patient was treated with  Cardizem IV without improvement. Cardizem drip was started. Consider anticoagulation for stroke prevention per Dr. Ubaldo Glassing. Echocardiogram showed ejection fraction 35 to 40%. Changed to p.o. Cardizem and started Eliquis per Dr. Ubaldo Glassing. Cardizem was discontinued and Lopressor was started by Dr.Fath.  Change to Toprol XL 150 mg po daily after discharge.  Elevated troponin due to demanding ischemia.   Chronic systolic CHF LV EF: 29% - 40%. Stable. Lasix as needed.  Hypokalemia. Continue potassium supplement and improved. Hyperkalemia. Hold potassium supplement.  Potassium was 5.8 this morning.    She was gIven NovoLog and D50 but the potassium is still 5.9 just now. Give D50, NovoLog IV, calcium gluconate IV and Lokelma.  Follow-up potassium.  Acute renal failure due to ATN and dehydration. Improving with normal saline IV and follow-up BMP as outpatient.  Hypomagnesemia and hypophosphatemia. Improved with supplement.  Moderate Malnutritionrelated to chronic illness. Follow dietitian's recommendation.  Abdominal pain. Abdominal pain is possible due to abdominal wall pain which is exacerbated by cough and movement.Also possible due to constipation. Dulcolax suppository and enema as needed.  Liver function test is unremarkable. Abdominal ultrasound shows severe hepatic steatosis.  Hyponatremia.   Improving with normal saline IV and follow-up BMP as outpatient.  Stage I x2 lung cancer status post radiation;remote history of breast cancer.Follow-up of Dr. Rogue Bussing as outpatient.  The patient need home health and PT.  All the records are reviewed and case discussed with Care Management/Social Worker. Management plans discussed with the patient, her daughter and they are in agreement.   CODE STATUS: Full Code  TOTAL TIME TAKING CARE OF THIS PATIENT: 36 minutes.   More than 50% of the time was spent in counseling/coordination of care: YES  POSSIBLE D/C IN 2  DAYS, DEPENDING ON CLINICAL CONDITION.   Demetrios Loll M.D on 04/26/2018 at 3:18 PM  Between 7am to 6pm - Pager - [phone]  After 6pm go to [email or web address] - [facility]

## 2018-04-26 NOTE — Discharge Instructions (Signed)
HHPT Fall precaution. Continue home O2 [place] 2L.

## 2018-04-27 ENCOUNTER — Inpatient Hospital Stay: Payer: Medicaid Other

## 2018-04-27 LAB — C DIFFICILE QUICK SCREEN W PCR REFLEX
C DIFFICILE (CDIFF) TOXIN: NEGATIVE
C Diff antigen: NEGATIVE
C Diff interpretation: NOT DETECTED

## 2018-04-27 LAB — POTASSIUM
POTASSIUM: 5 mmol/L (ref 3.5–5.1)
Potassium: 5.4 mmol/L — ABNORMAL HIGH (ref 3.5–5.1)

## 2018-04-27 LAB — GLUCOSE, CAPILLARY
GLUCOSE-CAPILLARY: 111 mg/dL — AB (ref 70–99)
GLUCOSE-CAPILLARY: 69 mg/dL — AB (ref 70–99)
GLUCOSE-CAPILLARY: 106 mg/dL — AB (ref 70–99)
Glucose-Capillary: 55 mg/dL — ABNORMAL LOW (ref 70–99)
Glucose-Capillary: 108 mg/dL — ABNORMAL HIGH (ref 70–99)
Glucose-Capillary: 49 mg/dL — ABNORMAL LOW (ref 70–99)
Glucose-Capillary: 78 mg/dL (ref 70–99)

## 2018-04-27 MED ORDER — HYDROCODONE-ACETAMINOPHEN 5-325 MG PO TABS
1.0000 | ORAL_TABLET | ORAL | Status: DC | PRN
  Administered 2018-04-27: 1 via ORAL
  Administered 2018-04-27 – 2018-04-30 (×9): 2 via ORAL
  Administered 2018-05-01: 1 via ORAL
  Administered 2018-05-01 – 2018-05-02 (×5): 2 via ORAL
  Administered 2018-05-03: 1 via ORAL
  Filled 2018-04-27 (×10): qty 2
  Filled 2018-04-27: qty 1
  Filled 2018-04-27 (×4): qty 2
  Filled 2018-04-27: qty 1

## 2018-04-27 MED ORDER — INSULIN ASPART 100 UNIT/ML ~~LOC~~ SOLN
0.0000 [IU] | Freq: Three times a day (TID) | SUBCUTANEOUS | Status: AC
  Administered 2018-04-28 – 2018-05-02 (×3): 2 [IU] via SUBCUTANEOUS
  Administered 2018-05-02: 1 [IU] via SUBCUTANEOUS
  Filled 2018-04-27 (×4): qty 1

## 2018-04-27 MED ORDER — DEXTROSE 50 % IV SOLN
25.0000 mL | Freq: Once | INTRAVENOUS | Status: AC
  Administered 2018-04-27: 25 mL via INTRAVENOUS
  Filled 2018-04-27: qty 50

## 2018-04-27 MED ORDER — SODIUM ZIRCONIUM CYCLOSILICATE 5 G PO PACK
5.0000 g | PACK | Freq: Once | ORAL | Status: AC
  Administered 2018-04-27: 5 g via ORAL
  Filled 2018-04-27: qty 1

## 2018-04-27 MED ORDER — DEXTROSE-NACL 5-0.9 % IV SOLN
INTRAVENOUS | Status: DC
  Administered 2018-04-27: 13:00:00 via INTRAVENOUS

## 2018-04-27 MED ORDER — INSULIN ASPART 100 UNIT/ML IV SOLN
10.0000 [IU] | Freq: Once | INTRAVENOUS | Status: AC
  Administered 2018-04-27: 10 [IU] via INTRAVENOUS
  Filled 2018-04-27: qty 0.1

## 2018-04-27 MED ORDER — IOPAMIDOL (ISOVUE-300) INJECTION 61%
15.0000 mL | INTRAVENOUS | Status: AC
  Administered 2018-04-27 (×2): 15 mL via ORAL

## 2018-04-27 MED ORDER — SODIUM ZIRCONIUM CYCLOSILICATE 5 G PO PACK
10.0000 g | PACK | Freq: Three times a day (TID) | ORAL | Status: DC
  Administered 2018-04-27 (×2): 10 g via ORAL
  Filled 2018-04-27 (×3): qty 2

## 2018-04-27 MED ORDER — IPRATROPIUM-ALBUTEROL 0.5-2.5 (3) MG/3ML IN SOLN
3.0000 mL | Freq: Three times a day (TID) | RESPIRATORY_TRACT | Status: DC
  Administered 2018-04-27 – 2018-05-04 (×19): 3 mL via RESPIRATORY_TRACT
  Filled 2018-04-27 (×20): qty 3

## 2018-04-27 NOTE — Progress Notes (Signed)
Pt is still complaining of pain after prn medication. MD notified. Orders to increase dose to range of 1-2 pills. I will continue to assess.

## 2018-04-27 NOTE — Progress Notes (Signed)
Pts Grand daughter Jonny Ruiz tells RN that patient is a heavy drinker and has also been taking pills no prescribed to her. If any more information is needed from grand daughter she may be reached at [phone]. I will continue to assess.

## 2018-04-27 NOTE — Progress Notes (Signed)
Pt has (6+) several loose, watery stools this shift. MD notified. Orders to test for c-diff. I will continue to assess.

## 2018-04-27 NOTE — Progress Notes (Signed)
In to see pt. Pt lethargic,clammy. Blood sugar taken. Blood sugar 23. Amp D50 given. Will continue to monitor and assess.

## 2018-04-27 NOTE — Progress Notes (Signed)
[facility] NAME: Danielle [name]    MR#:  [id number]  DATE OF BIRTH:  1956-09-23  SUBJECTIVE:  CHIEF COMPLAINT:   Chief Complaint  Patient presents with  . Shortness of Breath   The patient still has RUQ abdominal pain pain.  Small bowel movement yesterday.  Hypoglycemia 23 and 49 last night and this morning. REVIEW OF SYSTEMS:  Review of Systems  Constitutional: Positive for malaise/fatigue. Negative for chills and fever.  HENT: Negative for sore throat.   Eyes: Negative for blurred vision and double vision.  Respiratory: Negative for cough, hemoptysis, shortness of breath, wheezing and stridor.   Cardiovascular: Negative for chest pain, palpitations, orthopnea and leg swelling.       Bilateral ankle swelling  Gastrointestinal: Positive for abdominal pain. Negative for blood in stool, constipation, diarrhea, melena, nausea and vomiting.  Genitourinary: Negative for dysuria, flank pain and hematuria.  Musculoskeletal: Negative for back pain and joint pain.  Skin: Negative for rash.  Neurological: Negative for dizziness, sensory change, focal weakness, seizures, loss of consciousness, weakness and headaches.  Endo/Heme/Allergies: Negative for polydipsia.  Psychiatric/Behavioral: Negative for depression. The patient is not nervous/anxious.     DRUG ALLERGIES:  No Known Allergies VITALS:  Blood pressure (!) 145/90, pulse 80, temperature 97.6 F (36.4 C), temperature source Oral, resp. rate 18, height 5\' 6"  (1.676 m), weight 146 lb 3.2 oz (66.3 kg), SpO2 99 %. PHYSICAL EXAMINATION:  Physical Exam  Constitutional: She is oriented to person, place, and time. She appears well-developed.  HENT:  Head: Normocephalic.  Mouth/Throat: Oropharynx is clear and moist.  Eyes: Pupils are equal, round, and reactive to light. Conjunctivae and EOM are normal. No scleral icterus.  Neck: Normal range of motion. Neck supple. No JVD present. No  tracheal deviation present.  Cardiovascular: Normal rate and normal heart sounds. Exam reveals no gallop.  No murmur heard. Pulmonary/Chest: Effort normal. No respiratory distress. She has no wheezes. She has no rales.  Diminished lung sounds  Abdominal: Soft. Bowel sounds are normal. She exhibits distension. There is tenderness. There is no rebound.  Musculoskeletal: Normal range of motion. She exhibits no edema or tenderness.  Neurological: She is alert and oriented to person, place, and time. No cranial nerve deficit.  Skin: No rash noted. No erythema.  Psychiatric: She has a normal mood and affect.   LABORATORY PANEL:  Female CBC Recent Labs  Lab 04/24/18 0627  WBC 11.6*  HGB 8.6*  HCT 25.5*  PLT 241   ------------------------------------------------------------------------------------------------------------------ Chemistries  Recent Labs  Lab 04/23/18 0600  04/26/18 0546  04/27/18 1212  NA 133*   < > 133*  --   --   K 3.3*   < > 5.8*   < > 5.0  CL 102   < > 104  --   --   CO2 20*   < > 23  --   --   GLUCOSE 146*   < > 127*  --   --   BUN 31*   < > 43*  --   --   CREATININE 1.54*   < > 1.33*  --   --   CALCIUM 6.8*   < > 8.6*  --   --   MG 2.2   < > 2.1  --   --   AST 107*  --   --   --   --   ALT 78*  --   --   --   --  ALKPHOS 85  --   --   --   --   BILITOT 2.2*  --   --   --   --    < > = values in this interval not displayed.   RADIOLOGY:  No results found. ASSESSMENT AND PLAN:   49F acute hypoxemic respiratory failure, acute COPD exacerbation, dehydration, hypokalemia, hypoglycemia, renal failure, hypocalcemia, hypoalbuminemia, transaminasemia/hyperbilirubinemia, BNP elevation, Troponin-I elevation, macrocytic anemia.  Acute on chronic respiratory failure with hypoxia and hypercapnia due to COPD exacerbation, radiation pneumonitis/fibrosis and CHF. The patient is off BiPAP, on oxygen by nasal cannula 2L (home O2). Continue nebulizer and DuoNeb as needed,  complete Zithromax, discontinued IV Solu-Medrol, changed to prednisone.  New onset A. fib with RVR. The patient was treated with Cardizem IV without improvement. Cardizem drip was started. Consider anticoagulation for stroke prevention per Dr. Ubaldo Glassing. Echocardiogram showed ejection fraction 35 to 40%. Changed to p.o. Cardizem and started Eliquis per Dr. Ubaldo Glassing. Cardizem was discontinued and Lopressor was started by Dr.Fath.  Change to Toprol XL 150 mg po daily after discharge.  Elevated troponin due to demanding ischemia.   Chronic systolic CHF LV EF: 19% - 40%. Stable. Lasix as needed.  Hypokalemia. She was given potassium supplement and improved. Hyperkalemia. Hold potassium supplement. She was gIven NovoLog, D50,  calcium gluconate IV and Lokalema, but the potassium is still 5.4 this am.  Given D50, NovoLog IV, and Lokelma.    Potassium decreased to 5.0.  Acute renal failure due to ATN and dehydration. Improving with normal saline IV and follow-up BMP as outpatient.  Hypomagnesemia and hypophosphatemia. Improved with supplement.  Moderate Malnutritionrelated to chronic illness. Follow dietitian's recommendation.  Abdominal pain. Abdominal pain is possible due to abdominal wall pain which is exacerbated by cough and movement.Also possible due to constipation. Dulcolax suppository and enema as needed. Abdominal CAT scan.  Pain control.  Liver function test is unremarkable. Abdominal ultrasound shows severe hepatic steatosis.  Hyponatremia.   Improving with normal saline IV and follow-up BMP as outpatient.  Stage I x2 lung cancer status post radiation;remote history of breast cancer.Follow-up of Dr. Rogue Bussing as outpatient.  Hypoglycemia.  Possible due to poor intake.  The patient was given D50, started D5 normal saline IV, follow-up with blood sugar.  Decreased to sensitive sliding scale.  The patient need home health and PT.  All the records are  reviewed and case discussed with Care Management/Social Worker. Management plans discussed with the patient, her daughter and they are in agreement.   CODE STATUS: Full Code  TOTAL TIME TAKING CARE OF THIS PATIENT: 42 minutes.   More than 50% of the time was spent in counseling/coordination of care: YES  POSSIBLE D/C IN 2 DAYS, DEPENDING ON CLINICAL CONDITION.   Demetrios Loll M.D on 04/27/2018 at 2:16 PM  Between 7am to 6pm - Pager - [phone]  After 6pm go to [email or web address] - [facility]

## 2018-04-27 NOTE — Progress Notes (Signed)
Pts CBG is 49, pt is NPO for CT of abdomen. I will give 83UP dextrose per policy . I will continue to assess.

## 2018-04-28 ENCOUNTER — Telehealth: Payer: Self-pay | Admitting: Pharmacy Technician

## 2018-04-28 ENCOUNTER — Inpatient Hospital Stay: Payer: Medicaid Other

## 2018-04-28 DIAGNOSIS — R933 Abnormal findings on diagnostic imaging of other parts of digestive tract: Secondary | ICD-10-CM

## 2018-04-28 DIAGNOSIS — R748 Abnormal levels of other serum enzymes: Secondary | ICD-10-CM

## 2018-04-28 LAB — BASIC METABOLIC PANEL
Anion gap: 9 (ref 5–15)
BUN: 28 mg/dL — AB (ref 8–23)
CO2: 23 mmol/L (ref 22–32)
Calcium: 8.8 mg/dL — ABNORMAL LOW (ref 8.9–10.3)
Chloride: 99 mmol/L (ref 98–111)
Creatinine, Ser: 0.98 mg/dL (ref 0.44–1.00)
GFR calc Af Amer: >60 mL/min (ref >60)
GFR, EST NON AFRICAN AMERICAN: 60 mL/min — AB (ref >60)
Glucose, Bld: 97 mg/dL (ref 70–99)
POTASSIUM: 4.7 mmol/L (ref 3.5–5.1)
Sodium: 131 mmol/L — ABNORMAL LOW (ref 135–145)

## 2018-04-28 LAB — PROTIME-INR
INR: 1.29
Prothrombin Time: 16 seconds — ABNORMAL HIGH (ref 11.4–15.2)

## 2018-04-28 LAB — GLUCOSE, CAPILLARY
GLUCOSE-CAPILLARY: 111 mg/dL — AB (ref 70–99)
GLUCOSE-CAPILLARY: 105 mg/dL — AB (ref 70–99)
Glucose-Capillary: 82 mg/dL (ref 70–99)
Glucose-Capillary: 163 mg/dL — ABNORMAL HIGH (ref 70–99)
Glucose-Capillary: 119 mg/dL — ABNORMAL HIGH (ref 70–99)

## 2018-04-28 LAB — CBC
HEMATOCRIT: 25.3 % — AB (ref 35.0–47.0)
HEMOGLOBIN: 8.5 g/dL — AB (ref 12.0–16.0)
MCH: 34.2 pg — AB (ref 26.0–34.0)
MCHC: 33.6 g/dL (ref 32.0–36.0)
MCV: 101.9 fL — AB (ref 80.0–100.0)
Platelets: 282 10*3/uL (ref 150–440)
RBC: 2.48 MIL/uL — ABNORMAL LOW (ref 3.80–5.20)
RDW: 15.7 % — AB (ref 11.5–14.5)
WBC: 5.3 10*3/uL (ref 3.6–11.0)

## 2018-04-28 LAB — APTT
APTT: 147 s — AB (ref 24–36)
aPTT: 37 seconds — ABNORMAL HIGH (ref 24–36)

## 2018-04-28 LAB — HEPARIN LEVEL (UNFRACTIONATED): Heparin Unfractionated: 3 IU/mL — ABNORMAL HIGH (ref 0.30–0.70)

## 2018-04-28 MED ORDER — FUROSEMIDE 20 MG PO TABS
20.0000 mg | ORAL_TABLET | Freq: Once | ORAL | Status: AC
  Administered 2018-04-28: 20 mg via ORAL
  Filled 2018-04-28: qty 1

## 2018-04-28 MED ORDER — HYDROCODONE-ACETAMINOPHEN 5-325 MG PO TABS: 1.0000 | ORAL_TABLET | Freq: Four times a day (QID) | ORAL | 0 refills | Status: DC | PRN

## 2018-04-28 MED ORDER — HEPARIN (PORCINE) IN NACL 100-0.45 UNIT/ML-% IJ SOLN
600.0000 [IU]/h | INTRAMUSCULAR | Status: DC
  Administered 2018-04-28: 950 [IU]/h via INTRAVENOUS
  Administered 2018-04-30: 600 [IU]/h via INTRAVENOUS
  Filled 2018-04-28 (×2): qty 250

## 2018-04-28 MED ORDER — BOOST / RESOURCE BREEZE PO LIQD CUSTOM
1.0000 | Freq: Three times a day (TID) | ORAL | Status: DC
  Administered 2018-04-28 – 2018-05-03 (×9): 1 via ORAL

## 2018-04-28 NOTE — Progress Notes (Signed)
[facility] [place] NAME: Danielle [name]    MR#:  [id number]  DATE OF BIRTH:  [birth year]  SUBJECTIVE:  CHIEF COMPLAINT:   Chief Complaint  Patient presents with  . Shortness of Breath   The patient still has RUQ abdominal pain pain. She had large bowel movement.  REVIEW OF SYSTEMS:  Review of Systems  Constitutional: Positive for malaise/fatigue. Negative for chills and fever.  HENT: Negative for sore throat.   Eyes: Negative for blurred vision and double vision.  Respiratory: Negative for cough, hemoptysis, shortness of breath, wheezing and stridor.   Cardiovascular: Negative for chest pain, palpitations, orthopnea and leg swelling.       Bilateral ankle swelling  Gastrointestinal: Positive for abdominal pain. Negative for blood in stool, constipation, diarrhea, melena, nausea and vomiting.  Genitourinary: Negative for dysuria, flank pain and hematuria.  Musculoskeletal: Negative for back pain and joint pain.  Skin: Negative for rash.  Neurological: Negative for dizziness, sensory change, focal weakness, seizures, loss of consciousness, weakness and headaches.  Endo/Heme/Allergies: Negative for polydipsia.  Psychiatric/Behavioral: Negative for depression. The patient is not nervous/anxious.     DRUG ALLERGIES:  No Known Allergies VITALS:  Blood pressure (!) 122/91, pulse 66, temperature 97.7 F (36.5 C), temperature source Oral, resp. rate 16, height 5\' 6"  (1.676 m), weight 146 lb 3.2 oz (66.3 kg), SpO2 100 %. PHYSICAL EXAMINATION:  Physical Exam  Constitutional: She is oriented to person, place, and time. She appears well-developed.  HENT:  Head: Normocephalic.  Mouth/Throat: Oropharynx is clear and moist.  Eyes: Pupils are equal, round, and reactive to light. Conjunctivae and EOM are normal. No scleral icterus.  Neck: Normal range of motion. Neck supple. No JVD present. No tracheal deviation present.  Cardiovascular: Normal  rate and normal heart sounds. Exam reveals no gallop.  No murmur heard. Pulmonary/Chest: Effort normal. No respiratory distress. She has no wheezes. She has no rales.  Mild rale on left base.  Abdominal: Soft. Bowel sounds are normal. She exhibits no distension. There is tenderness. There is no rebound.  Tenderness on RUQ  Musculoskeletal: Normal range of motion. She exhibits no edema or tenderness.  Neurological: She is alert and oriented to person, place, and time. No cranial nerve deficit.  Skin: No rash noted. No erythema.  Psychiatric: She has a normal mood and affect.   LABORATORY PANEL:  Female CBC Recent Labs  Lab 04/28/18 1508  WBC 5.3  HGB 8.5*  HCT 25.3*  PLT 282   ------------------------------------------------------------------------------------------------------------------ Chemistries  Recent Labs  Lab 04/23/18 0600  04/26/18 0546  04/28/18 0610  NA 133*   < > 133*  --  131*  K 3.3*   < > 5.8*   < > 4.7  CL 102   < > 104  --  99  CO2 20*   < > 23  --  23  GLUCOSE 146*   < > 127*  --  97  BUN 31*   < > 43*  --  28*  CREATININE 1.54*   < > 1.33*  --  0.98  CALCIUM 6.8*   < > 8.6*  --  8.8*  MG 2.2   < > 2.1  --   --   AST 107*  --   --   --   --   ALT 78*  --   --   --   --   ALKPHOS 85  --   --   --   --  BILITOT 2.2*  --   --   --   --    < > = values in this interval not displayed.   RADIOLOGY:  Dg Abd Portable 1v  Result Date: 04/28/2018 CLINICAL DATA:  62 y.o. female with history of COPD, lung cancer status post SBRT, admitted with worsening shortness of breath and cough. GI consulted for CT abdomen revealing ascending colon thickening.Pt. Reports right upper quadrant, right mid quadrant abdominal pain. Cramping, intermittent, 5/10, nonradiating, associated with nausea. No vomiting.Reports having a bowel movement on this admission, and passing gas. No obstructive symptoms. Poor appetite. EXAM: PORTABLE ABDOMEN - 1 VIEW COMPARISON:  CT, 04/27/2018.  FINDINGS: There is no bowel dilation to suggest obstruction or significant adynamic ileus. There are scattered aortic and iliac artery vascular calcifications. Soft tissues are otherwise unremarkable. Residual contrast is noted in the colon. No acute skeletal abnormality IMPRESSION: 1. No acute findings.  No evidence of bowel obstruction. Electronically Signed   By: Lajean Manes M.D.   On: 04/28/2018 15:26   ASSESSMENT AND PLAN:   54F acute hypoxemic respiratory failure, acute COPD exacerbation, dehydration, hypokalemia, hypoglycemia, renal failure, hypocalcemia, hypoalbuminemia, transaminasemia/hyperbilirubinemia, BNP elevation, Troponin-I elevation, macrocytic anemia.  Acute on chronic respiratory failure with hypoxia and hypercapnia due to COPD exacerbation, radiation pneumonitis/fibrosis and CHF. The patient is off BiPAP, on oxygen by nasal cannula 2L (home O2). Continue nebulizer and DuoNeb as needed, complete Zithromax, discontinued IV Solu-Medrol, changed to prednisone and completed.  New onset A. fib with RVR. The patient was treated with Cardizem IV without improvement. Cardizem drip was started. Consider anticoagulation for stroke prevention per Dr. Ubaldo Glassing. Echocardiogram showed ejection fraction 35 to 40%. Changed to p.o. Cardizem and started Eliquis per Dr. Ubaldo Glassing. Cardizem was discontinued and Lopressor was started by Dr.Fath.  Change to Toprol XL 150 mg po daily after discharge.  Hold Eliquis and start heparin drip for possible colonoscopy on Wednesday.  Elevated troponin due to demanding ischemia.   Chronic systolic CHF LV EF: 25% - 40%. Stable. Lasix as needed.  Hypokalemia. She was given potassium supplement and improved. Hyperkalemia. Hold potassium supplement. She was gIven NovoLog, D50,  calcium gluconate IV and Lokalema, but the potassium is still 5.4 this am.  Given D50, NovoLog IV, and Lokelma.    Potassium decreased to 4.7.  Acute renal failure due to ATN  and dehydration. Improved with normal saline IV.  Hypomagnesemia and hypophosphatemia. Improved with supplement.  Moderate Malnutritionrelated to chronic illness. Follow dietitian's recommendation.  Abdominal pain. The patient was initially suspected due to constipation.  She still has abdominal pain after large bowel movement. Abdominal CAT scan: Focal area of irregular wall thickening and relative narrowing of the right colon at and just below the hepatic flexure extending over approximately 7-8 cm.  Pain control. Per Dr. Bonna Gains, colonoscopy on Wednesday after cardiology clearance.  Liver function test is unremarkable. Abdominal ultrasound shows severe hepatic steatosis.  Hyponatremia.  stable.  Stage I x2 lung cancer status post radiation;remote history of breast cancer.Follow-up of Dr. Rogue Bussing as outpatient.  Hypoglycemia.  Possible due to poor intake.  The patient was given D50, started D5 normal saline IV. Decreased to sensitive sliding scale.  Improved.  The patient need home health and PT.  I discussed with Dr. Bonna Gains and Dr. [name]. All the records are reviewed and case discussed with Care Management/Social Worker. Management plans discussed with the patient, her daughter and they are in agreement.   CODE STATUS: Full Code  TOTAL TIME TAKING  CARE OF THIS PATIENT: 46 minutes.   More than 50% of the time was spent in counseling/coordination of care: YES  POSSIBLE D/C IN 2-3 DAYS, DEPENDING ON CLINICAL CONDITION.   Demetrios Loll M.D on 04/28/2018 at 4:16 PM  Between 7am to 6pm - Pager - [phone]  After 6pm go to [email or web address] - [facility]

## 2018-04-28 NOTE — Telephone Encounter (Signed)
Provided patient with new patient packet to obtain [facility] services.  Patient understands that [facility] must receive 2019 financial documentation in order to determine eligibility.  Maxum Cassarino J. Flem Enderle Care Manager [facility] 

## 2018-04-28 NOTE — Consult Note (Addendum)
Danielle Antigua, MD [address], [place], [place], [place], [address] [address], [place], [place], [place], [address] Phone: [phone]  Fax: [phone]  Consultation  Referring Provider:     Dr. Bridgett Larsson Primary Care Physician:  Elisabeth Cara, NP Reason for Consultation:     Ascending colon thickening  Date of Admission:  04/21/2018 Date of Consultation:  04/28/2018         HPI:   BENTLEIGH WAREN is a 62 y.o. female with history of COPD, lung cancer status post SBRT, admitted with worsening shortness of breath and cough.  GI consulted for CT abdomen revealing ascending colon thickening.  Pt. Reports right upper quadrant, right mid quadrant abdominal pain.  Cramping, intermittent, 5/10, nonradiating, associated with nausea.  No vomiting.  Reports having a bowel movement on this admission, and passing gas.  No obstructive symptoms. Poor appetite.  No melena, or hematochezia.  Hemoglobin noted to be around 8 this admission, with baseline of around 10.  It is microcytic, with MCV of around 102.  Last colonoscopy was in 2006, see provation.  Done by Dr. Nicolasa Ducking.  This reported the colon to be normal, and states biopsies were done.  Biopsy report not available.  No prior EGD.  On admission liver enzymes were also noted to be elevated with total bilirubin of 4.2 admission, with decreased to around 2 last checked 4 days ago.  With decrease in transaminases as well.  Ultrasound and CT showed hepatic steatosis.  No bile duct dilation was mentioned in the CT scan.  She was also seen by oncology, due to CT showing consolidative changes in the right upper lobe.  Oncology recommended PET scan for evaluation of radiation-induced changes versus recurrent tumor.  PET scan has not been done yet.  Patient was also seen by cardiology on this admission due to new onset A. fib with RVR.  She has been started on Eliquis.   Past Medical History:  Diagnosis Date  . Anxiety   . Arthritis   .  Cancer ([place]) left    breast cancer 2000, chemo tx's with total mastectomy and lymph nodes resected.   . Cancer of right lung ([place]) 02/21/2016   rad tx's.   [name] COPD (chronic obstructive pulmonary disease) ([place])   . Dependence on supplemental oxygen   . Depression   . Heart murmur   . Hypertension   . Lung nodule   . Lymphedema   . Shortness of breath dyspnea    with exertion  . Status post chemotherapy 2001   left breast cancer  . Status post radiation therapy 2001   left breast cancer    Past Surgical History:  Procedure Laterality Date  . Breast Biospy Left    [facility]  . BREAST SURGERY    . DILATION AND CURETTAGE OF UTERUS    . ELECTROMAGNETIC NAVIGATION BROCHOSCOPY Right 04/11/2016   Procedure: ELECTROMAGNETIC NAVIGATION BRONCHOSCOPY;  Surgeon: Vilinda Boehringer, MD;  Location: [facility] ORS;  Service: Cardiopulmonary;  Laterality: Right;  . history of colonoscopy]    . LUNG BIOPSY    . MASTECTOMY Left    2000, [facility]  . ROTATOR CUFF REPAIR Right    [facility]    Prior to Admission medications   Medication Sig Start Date End Date Taking? Authorizing Provider  citalopram (CELEXA) 40 MG tablet Take 40 mg by mouth daily.    Yes [provider]  losartan (COZAAR) 100 MG tablet Take 1 tablet (100 mg total) by mouth daily.  02/12/17  Yes [name], MD  albuterol (PROVENTIL HFA;VENTOLIN HFA) 108 (90 Base) MCG/ACT inhaler Inhale 2 puffs into the lungs every 6 (six) hours as needed for wheezing or shortness of breath. 04/24/18   Demetrios Loll, MD  apixaban (ELIQUIS) 5 MG TABS tablet Take 1 tablet (5 mg total) by mouth 2 (two) times daily. 04/24/18   Demetrios Loll, MD  budesonide-formoterol [facility]) 160-4.5 MCG/ACT inhaler Inhale 2 puffs into the lungs 2 (two) times daily. 04/24/18   Demetrios Loll, MD  celecoxib (CELEBREX) 200 MG capsule Take 1 capsule (200 mg total) by mouth daily. Patient not taking: Reported on 04/21/2018 07/23/15   Menshew, Dannielle Karvonen, PA-C  HYDROcodone-acetaminophen  (NORCO/VICODIN) 5-325 MG tablet Take 1-2 tablets by mouth every 6 (six) hours as needed for moderate pain or severe pain. 04/28/18   Demetrios Loll, MD  metoprolol succinate (TOPROL XL) 50 MG 24 hr tablet Take 3 tablets (150 mg total) by mouth daily. Take with or immediately following a meal. 04/26/18 04/26/19  Demetrios Loll, MD  metoprolol succinate (TOPROL-XL) 25 MG 24 hr tablet Take 25 mg by mouth daily. 10/23/17 10/23/18  [provider]  potassium chloride SA (K-DUR,KLOR-CON) 20 MEQ tablet Take 1 tablet (20 mEq total) by mouth daily. Patient not taking: Reported on 04/21/2018 11/14/15   Forest Gleason, MD  predniSONE (DELTASONE) 20 MG tablet Take 2 tablets (40 mg total) by mouth daily with breakfast. 04/24/18   Demetrios Loll, MD    Family History  Problem Relation Age of Onset  . Breast cancer Mother 79  . Cancer Mother        Breast   . Cirrhosis Father   . Breast cancer Paternal Aunt 71  . Cancer Maternal Aunt        Breast      Social History   Tobacco Use  . Smoking status: Former Smoker    Packs/day: 0.50    Years: 20.00    Pack years: 10.00    Types: Cigarettes    Last attempt to quit: 07/02/2012    Years since quitting: 5.8  . Smokeless tobacco: Never Used  . Tobacco comment: quit 2014  Substance Use Topics  . Alcohol use: Yes    Comment: Occasionally  . Drug use: No    Allergies as of 04/21/2018  . (No Known Allergies)    Review of Systems:    All systems reviewed and negative except where noted in HPI.   Physical Exam:  Vital signs in last 24 hours: Vitals:   04/27/18 2054 04/28/18 0546 04/28/18 0735 04/28/18 0742  BP:  (!) 131/91  (!) 122/91  Pulse: 85 85  66  Resp: 18 18  16   Temp:  98.2 F (36.8 C)  97.7 F (36.5 C)  TempSrc:  Oral  Oral  SpO2: 94% 100% 92% 100%  Weight:      Height:       Last BM Date: 04/27/18 General:   Pleasant, cooperative in NAD Head:  Normocephalic and atraumatic. Eyes:   No icterus.   Conjunctiva pink. PERRLA. Ears:  Normal  auditory acuity. Neck:  Supple; no masses or thyroidomegaly Lungs: Respirations even and unlabored. Lungs clear to auscultation bilaterally.   No wheezes, crackles, or rhonchi.  Abdomen:  Soft, nondistended, nontender. Normal bowel sounds. No appreciable masses or hepatomegaly.  No rebound or guarding.  Neurologic:  Alert and oriented x3;  grossly normal neurologically. Skin:  Intact without significant lesions or rashes. Cervical Nodes:  No significant  cervical adenopathy. Psych:  Alert and cooperative. Normal affect.  LAB RESULTS: No results for input(s): WBC, HGB, HCT, PLT in the last 72 hours. BMET Recent Labs    04/26/18 0546  04/27/18 0422 04/27/18 1212 04/28/18 0610  NA 133*  --   --   --  131*  K 5.8*   < > 5.4* 5.0 4.7  CL 104  --   --   --  99  CO2 23  --   --   --  23  GLUCOSE 127*  --   --   --  97  BUN 43*  --   --   --  28*  CREATININE 1.33*  --   --   --  0.98  CALCIUM 8.6*  --   --   --  8.8*   < > = values in this interval not displayed.   LFT No results for input(s): PROT, ALBUMIN, AST, ALT, ALKPHOS, BILITOT, BILIDIR, IBILI in the last 72 hours. PT/INR No results for input(s): LABPROT, INR in the last 72 hours.  STUDIES: Ct Abdomen Pelvis Wo Contrast  Result Date: 04/27/2018 CLINICAL DATA:  88F acute hypoxemic respiratory failure, acute COPD exacerbation, dehydration, hypokalemia, hypoglycemia, renal failure, hypocalcemia, hypoalbuminemia, transaminasemia/hyperbilirubinemia, BNP elevation, Troponin-I elevation, macrocytic anemiaHx of lung and breast cancer EXAM: CT ABDOMEN AND PELVIS WITHOUT CONTRAST TECHNIQUE: Multidetector CT imaging of the abdomen and pelvis was performed following the standard protocol without IV contrast. COMPARISON:  PET-CT, 07/13/2016 FINDINGS: Lower chest: Small right pleural effusion. Minor dependent right lower lobe atelectasis. No evidence of pneumonia or pulmonary edema. Hepatobiliary: Markedly decreased attenuation of the liver  consistent with extensive fatty infiltration. No liver mass or focal lesion. Gallbladder is contracted but otherwise unremarkable. No bile duct dilation. Pancreas: Unremarkable. No pancreatic ductal dilatation or surrounding inflammatory changes. Spleen: Small spleen.  No splenic mass or focal lesion. Adrenals/Urinary Tract: Adrenal glands are unremarkable. Kidneys are normal, without renal calculi, focal lesion, or hydronephrosis. Bladder is unremarkable. Stomach/Bowel: There is irregular wall thickening with associated narrowing of the ascending colon at and just inferior to the hepatic flexure, extending for 7-8 cm in length. Small area of inflammatory type stranding is seen along the lateral margin of this portion of the colon abutting the inferior margin of the liver. No other colonic wall thickening or evidence of inflammation. Stomach and small bowel are unremarkable. Cecum is distended to 6.6 cm. Normal appendix is visualized. Vascular/Lymphatic: Extensive aortic atherosclerotic calcifications extending into the branch vessels. No aneurysm. No adenopathy. Reproductive: Uterus and bilateral adnexa are unremarkable. Other: No abdominal wall hernia or abnormality. No abdominopelvic ascites. Musculoskeletal: Mild compression deformity of L5, of unclear chronicity, but knows likely chronic. There is no associated periatrial edema or hemorrhage. No other fractures. No osteoblastic or osteolytic lesions. IMPRESSION: 1. Focal area of irregular wall thickening and relative narrowing of the right colon at and just below the hepatic flexure extending over approximately 7-8 cm. This may be infectious or inflammatory or neoplastic in etiology. Recommend follow-up colonoscopy. No other evidence of an acute abnormality. 2. Extensive hepatic steatosis. 3. Small right pleural effusion. 4. Extensive aortic atherosclerosis. 5. Mild compression fracture of L5 of unclear chronicity but most likely chronic. Electronically Signed    By: Lajean Manes M.D.   On: 04/27/2018 14:56      Impression / Plan:   Danielle [name] is a 62 y.o. y/o female with significant comorbidities including COPD, history of lung cancer status post SBRT, admitted with  shortness of breath requiring BiPAP and ICU admission, now on the floor with nasal cannula, also with new onset A. fib with RVR now on anticoagulation, with GI consulted for ascending colon thickening seen on CT scan  -Ascending colon thickening This finding is concerning for malignancy, also has cecal distention to 6.6 cm, also patient reports abdominal pain  However, patient is complicated with acute shortness of breath on this admission requiring BiPAP and ICU admission, now improved with nasal cannula on the floor.  Also with acutely elevated BNP to the 2000 on admission, and new onset A. fib with RVR, now rate controlled, and on anticoagulation  Please obtain cardiac clearance by cardiology for procedure.  If patient is cleared from cardiac standpoint, and if Eliquis can be held after evaluation by cardiology, and if anesthesia clears this patient for sedation, can proceed with colonoscopy on this admission, with colonoscopy prep to evaluate the area.  If patient is too high risk for anesthesia to sedate, and cardiology to clear, outpatient colonoscopy can be done when acute issues have stabilized, and patient is medically optimized for procedure.  Would also recommend surgery consult due to cecal distention noticed on imaging.  Dr. Bridgett Larsson states he has notified Dr. Shann Medal team.  If patient develops signs of acute abdomen, or worsening abdominal pain, would recommend stat abdominal x-ray, and stat surgery consult at that time to rule out perforation.  No evidence of acute abdomen or perforation at this time.  (Patient ate solid food today.  Would start on clear liquid diet.  Patient will need to be on clear liquid diet the day the colonoscopy prep is administered, therefore earliest a  colonoscopy can be done is Wednesday, since she ate solid food today.  Since the colonoscope needs to be advanced all the way to the ascending colon to evaluate the area in question on the CT scan, the best prep we can obtain in this patient would be beneficial and ideal.  Prepping her today when she has had solid food, entails the risk of having a poor prep when colonoscopy is attempted tomorrow, and possible inability to advance to the ascending colon which needs to be evaluated.  Therefore, colonoscopy prep is best done when she is on clear liquid diet. Therefore colonoscopy prep tomorrow, with procedure on Wednesday if she is cleared by cardiology and Eliquis is able to be held.)   -Elevated transaminases Likely due to fluid overload that was present on admission, as evidenced by BNP of 2666 on July 8 Elevated transaminases have improved as patient is diuresed with Lasix, but have not been repeated for 4 days. Repeat hepatic function panel/CMP to see if they have normalized  Above discussed with Dr. Bridgett Larsson who agrees with the plan.  Thank you for involving me in the care of this patient.      LOS: 7 days   Virgel Manifold, MD  04/28/2018, 8:35 AM

## 2018-04-28 NOTE — Progress Notes (Signed)
CCMD reporting that pt had 5 beats of V. Tach on tele/ pt asymptomatic/ Dr. Bridgett Larsson and Dr. [name] made aware/ will continue to monitor.

## 2018-04-28 NOTE — Consult Note (Signed)
SURGICAL CONSULTATION NOTE (initial) - cpt: 99255  HISTORY OF PRESENT ILLNESS (HPI):  62 y.o. female on home supplemental oxygen as needed for COPD s/p radiation for Right lung cancer presented to [facility] ED 7 days ago for evaluation of SOB with wheezing. Patient also reports constipation, abdominal distention, and 8 lbs of unintentional weight loss over the past 3 months along with nausea and non-bloody emesis that has not recurred during her admission. Lastly, she describes RUQ abdominal pain, which followed her episodes of N/V has also since improved. She denies any blood per rectum. She believes an uncle was diagnosed with colon cancer, and it has been "many years" since her last colonoscopy. Patient currently reports +flatus and denies fever/chills, CP, or SOB.   Surgery is consulted by medical physician Dr. Bridgett Larsson in this context for evaluation and management non-obstructing circumferential colonic thickening on CT with associated colonic dilation.  PAST MEDICAL HISTORY (PMH):  Past Medical History:  Diagnosis Date  . Anxiety   . Arthritis   . Cancer ([place]) left    breast cancer 2000, chemo tx's with total mastectomy and lymph nodes resected.   . Cancer of right lung ([place]) 02/21/2016   rad tx's.   [name] COPD (chronic obstructive pulmonary disease) ([place])   . Dependence on supplemental oxygen   . Depression   . Heart murmur   . Hypertension   . Lung nodule   . Lymphedema   . Shortness of breath dyspnea    with exertion  . Status post chemotherapy 2001   left breast cancer  . Status post radiation therapy 2001   left breast cancer     PAST SURGICAL HISTORY ([place]):  Past Surgical History:  Procedure Laterality Date  . Breast Biospy Left    [facility]  . BREAST SURGERY    . DILATION AND CURETTAGE OF UTERUS    . ELECTROMAGNETIC NAVIGATION BROCHOSCOPY Right 04/11/2016   Procedure: ELECTROMAGNETIC NAVIGATION BRONCHOSCOPY;  Surgeon: Vilinda Boehringer, MD;  Location: [facility] ORS;  Service: Cardiopulmonary;   Laterality: Right;  . history of colonoscopy]    . LUNG BIOPSY    . MASTECTOMY Left    2000, [facility]  . ROTATOR CUFF REPAIR Right    [facility]     MEDICATIONS:  Prior to Admission medications   Medication Sig Start Date End Date Taking? Authorizing Provider  citalopram (CELEXA) 40 MG tablet Take 40 mg by mouth daily.    Yes [provider]  losartan (COZAAR) 100 MG tablet Take 1 tablet (100 mg total) by mouth daily. 02/12/17  Yes [name], MD  albuterol (PROVENTIL HFA;VENTOLIN HFA) 108 (90 Base) MCG/ACT inhaler Inhale 2 puffs into the lungs every 6 (six) hours as needed for wheezing or shortness of breath. 04/24/18   Demetrios Loll, MD  apixaban (ELIQUIS) 5 MG TABS tablet Take 1 tablet (5 mg total) by mouth 2 (two) times daily. 04/24/18   Demetrios Loll, MD  budesonide-formoterol [facility]) 160-4.5 MCG/ACT inhaler Inhale 2 puffs into the lungs 2 (two) times daily. 04/24/18   Demetrios Loll, MD  celecoxib (CELEBREX) 200 MG capsule Take 1 capsule (200 mg total) by mouth daily. Patient not taking: Reported on 04/21/2018 07/23/15   Menshew, Dannielle Karvonen, PA-C  HYDROcodone-acetaminophen (NORCO/VICODIN) 5-325 MG tablet Take 1 tablet by mouth every 6 (six) hours as needed for moderate pain or severe pain. 04/28/18   Demetrios Loll, MD  metoprolol succinate (TOPROL XL) 50 MG 24 hr tablet Take 3 tablets (150 mg total) by mouth daily. Take  with or immediately following a meal. 04/26/18 04/26/19  Demetrios Loll, MD  metoprolol succinate (TOPROL-XL) 25 MG 24 hr tablet Take 25 mg by mouth daily. 10/23/17 10/23/18  [provider]  potassium chloride SA (K-DUR,KLOR-CON) 20 MEQ tablet Take 1 tablet (20 mEq total) by mouth daily. Patient not taking: Reported on 04/21/2018 11/14/15   Forest Gleason, MD  predniSONE (DELTASONE) 20 MG tablet Take 2 tablets (40 mg total) by mouth daily with breakfast. 04/24/18   Demetrios Loll, MD     ALLERGIES:  No Known Allergies   SOCIAL HISTORY:  Social History   Socioeconomic  History  . Marital status: Divorced    Spouse name: Not on file  . Number of children: Not on file  . Years of education: Not on file  . Highest education level: Not on file  Occupational History  . Not on file  Social Needs  . Financial resource strain: Not on file  . Food insecurity:    Worry: Not on file    Inability: Not on file  . Transportation needs:    Medical: Not on file    Non-medical: Not on file  Tobacco Use  . Smoking status: Former Smoker    Packs/day: 0.50    Years: 20.00    Pack years: 10.00    Types: Cigarettes    Last attempt to quit: 07/02/2012    Years since quitting: 5.8  . Smokeless tobacco: Never Used  . Tobacco comment: quit 2014  Substance and Sexual Activity  . Alcohol use: Yes    Comment: Occasionally  . Drug use: No  . Sexual activity: Never  Lifestyle  . Physical activity:    Days per week: Not on file    Minutes per session: Not on file  . Stress: Not on file  Relationships  . Social connections:    Talks on phone: Not on file    Gets together: Not on file    Attends religious service: Not on file    Active member of club or organization: Not on file    Attends meetings of clubs or organizations: Not on file    Relationship status: Not on file  . Intimate partner violence:    Fear of current or ex partner: Not on file    Emotionally abused: Not on file    Physically abused: Not on file    Forced sexual activity: Not on file  Other Topics Concern  . Not on file  Social History Narrative  . Not on file    The patient currently resides (home / rehab facility / nursing home): Home The patient normally is (ambulatory / bedbound): Limited ambulation   FAMILY HISTORY:  Family History  Problem Relation Age of Onset  . Breast cancer Mother 11  . Cancer Mother        Breast   . Cirrhosis Father   . Breast cancer Paternal Aunt 37  . Cancer Maternal Aunt        Breast      REVIEW OF SYSTEMS:  Constitutional: denies weight loss,  fever, chills, or sweats  Eyes: denies any other vision changes, history of eye injury  ENT: denies sore throat, hearing problems  Respiratory: shortness of breath and wheezing as per HPI  Cardiovascular: denies chest pain, palpitations  Gastrointestinal: abdominal pain, N/V, and bowel function as per HPI Genitourinary: denies burning with urination or urinary frequency Musculoskeletal: denies any other joint pains or cramps  Skin: denies any other rashes  or skin discolorations  Neurological: denies any other headache, dizziness, weakness  Psychiatric: denies any other depression, anxiety   All other review of systems were negative   VITAL SIGNS:  Temp:  [97.7 F (36.5 C)-98.2 F (36.8 C)] 97.7 F (36.5 C) (07/15 0742) Pulse Rate:  [66-85] 66 (07/15 0742) Resp:  [16-18] 16 (07/15 0742) BP: (122-150)/(84-91) 122/91 (07/15 0742) SpO2:  [92 %-100 %] 100 % (07/15 0742)     Height: 5\' 6"  (167.6 cm) Weight: 146 lb 3.2 oz (66.3 kg) BMI (Calculated): 23.61   INTAKE/OUTPUT:  This shift: Total I/O In: 240 [P.O.:240] Out: 400 [Urine:400]  Last 2 shifts: @IOLAST2SHIFTS @   PHYSICAL EXAM:  Constitutional:  -- Normal body habitus  -- Awake, alert, and oriented x3, no apparent distress Eyes:  -- Pupils equally round and reactive to light  -- No scleral icterus, B/L no occular discharge Ear, nose, throat: -- Neck is FROM WNL -- No jugular venous distension  Pulmonary:  -- No wheezes or rhales -- Equal breath sounds bilaterally -- Breathing non-labored at rest Cardiovascular:  -- S1, S2 present  -- No pericardial rubs  Gastrointestinal:  -- Abdomen soft, nontender, non-distended, no guarding or rebound tenderness -- No abdominal masses appreciated, pulsatile or otherwise  Musculoskeletal and Integumentary:  -- Wounds or skin discoloration: None appreciated -- Extremities: B/L UE and LE FROM, hands and feet warm, no edema  Neurologic:  -- Motor function: Intact and symmetric --  Sensation: Intact and symmetric Psychiatric:  -- Mood and affect WNL  Labs:  CBC Latest Ref Rng & Units 04/24/2018 04/22/2018 04/21/2018  WBC 3.6 - 11.0 K/uL 11.6(H) 5.3 7.9  Hemoglobin 12.0 - 16.0 g/dL 8.6(L) 8.0(L) 9.3(L)  Hematocrit 35.0 - 47.0 % 25.5(L) 23.9(L) 27.2(L)  Platelets 150 - 440 K/uL 241 206 229   CMP Latest Ref Rng & Units 04/28/2018 04/27/2018 04/27/2018  Glucose 70 - 99 mg/dL 97 - -  BUN 8 - 23 mg/dL 28(H) - -  Creatinine 0.44 - 1.00 mg/dL 0.98 - -  Sodium 135 - 145 mmol/L 131(L) - -  Potassium 3.5 - 5.1 mmol/L 4.7 5.0 5.4(H)  Chloride 98 - 111 mmol/L 99 - -  CO2 22 - 32 mmol/L 23 - -  Calcium 8.9 - 10.3 mg/dL 8.8(L) - -  Total Protein 6.5 - 8.1 g/dL - - -  Total Bilirubin 0.3 - 1.2 mg/dL - - -  Alkaline Phos 38 - 126 U/L - - -  AST 15 - 41 U/L - - -  ALT 0 - 44 U/L - - -   Imaging studies:  CT Abdomen and Pelvis with Contrast (04/27/2018) - personally reviewed and discussed with patient, demonstrates contrast across, proximal, and distal to relative circumferential narrowed ascending colon just inferior to hepatic flexure with feces also both proximal and distal to narrowed ascending colon associated with adjacent mild/moderate colonic dilation to 6.6 cm 1. Focal area of irregular wall thickening and relative narrowing of the right colon at and just below the hepatic flexure extending over approximately 7-8 cm. This may be infectious or inflammatory or neoplastic in etiology. Recommend follow-up colonoscopy. No other evidence of an acute abnormality. 2. Extensive hepatic steatosis. 3. Small right pleural effusion. 4. Extensive aortic atherosclerosis. 5. Mild compression fracture of L5 of unclear chronicity but most likely chronic.  Limited RUQ Abdominal Ultrasound (04/23/2018) No focal lesion identified. Diffusely increased in parenchymal echogenicity. 9 mm simple cyst in the right hepatic lobe. Portal vein is patent on color Doppler  imaging with normal direction  of blood flow towards the liver.  Assessment/Plan: (ICD-10's: K54.9) 62 y.o. female with focal ascending colitis/thickening/narrowing x 7 - 8 cm just inferior to hepatic flexure, complicated by pertinent comorbidities including HTN, severe COPD on home supplemental oxygen prn, Right lung cancer s/p radiation with known Right hilar mass, cardiac murmur, lymphedema, generalized anxiety disorder, major depression disorder, and history of Left breast cancer s/p mastectomy and chemoradiation.   - check CEA  - follow-up colonoscopy results  - no indication of imminent or emergent surgical intervention at this time   - CT does not appear to indicate obstruction with contrast and feces both proximal and distal to ascending colonic thickening/stenosis  - medical management of patient's medical comorbidities  - DVT prophylaxis  All of the above findings and recommendations were discussed with the patient, and all of patient's questions were answered to her expressed satisfaction.  Thank you for the opportunity to participate in this patient's care.   -- Marilynne Drivers Rosana Hoes, MD, [place]: [facility] - Partnering for exceptional care. Office: [phone]

## 2018-04-28 NOTE — Progress Notes (Signed)
ANTICOAGULATION CONSULT NOTE   Pharmacy Consult for heparin Indication: atrial fibrillation  No Known Allergies  Patient Measurements: Height: 5\' 6"  (167.6 cm) Weight: 146 lb 3.2 oz (66.3 kg) IBW/kg (Calculated) : 59.3 Heparin Dosing Weight: 66.3 kg   Vital Signs: Temp: 98.4 F (36.9 C) (07/15 1939) Temp Source: Oral (07/15 1939) BP: 119/82 (07/15 1939) Pulse Rate: 79 (07/15 1939)  Labs: Recent Labs    04/26/18 0546 04/28/18 0610 04/28/18 1508 04/28/18 2216  HGB  --   --  8.5*  --   HCT  --   --  25.3*  --   PLT  --   --  282  --   APTT  --   --  37* 147*  LABPROT  --   --  16.0*  --   INR  --   --  1.29  --   HEPARINUNFRC  --   --  3.00*  --   CREATININE 1.33* 0.98  --   --     Estimated Creatinine Clearance: 54.3 mL/min (by C-G formula based on SCr of 0.98 mg/dL).   Medical History: Past Medical History:  Diagnosis Date  . Anxiety   . Arthritis   . Cancer ([place]) left    breast cancer 2000, chemo tx's with total mastectomy and lymph nodes resected.   . Cancer of right lung ([place]) 02/21/2016   rad tx's.   [name] COPD (chronic obstructive pulmonary disease) ([place])   . Dependence on supplemental oxygen   . Depression   . Heart murmur   . Hypertension   . Lung nodule   . Lymphedema   . Shortness of breath dyspnea    with exertion  . Status post chemotherapy 2001   left breast cancer  . Status post radiation therapy 2001   left breast cancer    Medications:  Patient was started on apixaban 5mg  BID while inpatient for new onset afib. Last dose of apixaban 5mg  was 7/14 2049.   Assessment: 62 yo female admitted with SOB, cough, and abdominal pain found to have thickening of ascending colon. Patient diagnosed with afib and started on apixaban and cardizem. GI consult obtained and pending cardiology clear, patient will undergo colonoscopy. Apixaban now on hold pending colonoscopy. Pharmacy consulted to dose and monitor heparin.   Goal of Therapy:  Heparin level  0.3-0.7 units/ml Monitor platelets by anticoagulation protocol: Yes   Plan:  07/15 @ 2200 aPTT 147 sec supratherapeutic. Will decrease rate to 800 units/hr and will recheck aPTT/HL @ 0600. CBC check in the morning.  Tobie Lords, PharmD, BCPS Clinical Pharmacist 04/28/2018

## 2018-04-28 NOTE — Consult Note (Signed)
[facility] Cardiology Consultation Note  Patient ID: Danielle [name], MRN: [id number], DOB/AGE: 1956/04/18 62 y.o. Admit date: 04/21/2018   Date of Consult: 04/28/2018 Primary Physician: Elisabeth Cara, NP Primary Cardiologist: [name]  Chief Complaint:  Chief Complaint  Patient presents with  . Shortness of Breath   Reason for Consult: Myopathy with atrial fibrillation  HPI: 62 y.o. female with known mild dilated cardiomyopathy LV systolic dysfunction chronic obstructive pulmonary disease essential hypertension mixed hyperlipidemia having paroxysmal nonvalvular atrial fibrillation in the past.  The patient was placed on appropriate medication management for her atrial fibrillation which was seen on Holter monitor.  This now is treated with 75 mg of metoprolol and maintaining normal rhythm at this time.  She additionally has had Eliquis for further risk reduction in stroke with atrial fibrillation with no current bleeding issues.  The patient has had admitted for abdominal discomfort and now has some concerns with gastric and/or esophageal thickening need further intervention.  With this the patient will need an upper endoscopy.  The patient is a low risk for endoscopy and additionally has currently been stable from the cardiac standpoint maintaining normal rhythm with no evidence of true angina and or concerns of heart failure.  An EKG shows normal sinus rhythm  Past Medical History:  Diagnosis Date  . Anxiety   . Arthritis   . Cancer ([place]) left    breast cancer 2000, chemo tx's with total mastectomy and lymph nodes resected.   . Cancer of right lung ([place]) 02/21/2016   rad tx's.   [name] COPD (chronic obstructive pulmonary disease) ([place])   . Dependence on supplemental oxygen   . Depression   . Heart murmur   . Hypertension   . Lung nodule   . Lymphedema   . Shortness of breath dyspnea    with exertion  . Status post chemotherapy 2001   left breast cancer  . Status post radiation  therapy 2001   left breast cancer      Surgical History:  Past Surgical History:  Procedure Laterality Date  . Breast Biospy Left    [facility]  . BREAST SURGERY    . DILATION AND CURETTAGE OF UTERUS    . ELECTROMAGNETIC NAVIGATION BROCHOSCOPY Right 04/11/2016   Procedure: ELECTROMAGNETIC NAVIGATION BRONCHOSCOPY;  Surgeon: Vilinda Boehringer, MD;  Location: [facility] ORS;  Service: Cardiopulmonary;  Laterality: Right;  . history of colonoscopy]    . LUNG BIOPSY    . MASTECTOMY Left    2000, [facility]  . ROTATOR CUFF REPAIR Right    [facility]     Home Meds: Prior to Admission medications   Medication Sig Start Date End Date Taking? Authorizing Provider  citalopram (CELEXA) 40 MG tablet Take 40 mg by mouth daily.    Yes [provider]  losartan (COZAAR) 100 MG tablet Take 1 tablet (100 mg total) by mouth daily. 02/12/17  Yes [name], MD  albuterol (PROVENTIL HFA;VENTOLIN HFA) 108 (90 Base) MCG/ACT inhaler Inhale 2 puffs into the lungs every 6 (six) hours as needed for wheezing or shortness of breath. 04/24/18   Demetrios Loll, MD  apixaban (ELIQUIS) 5 MG TABS tablet Take 1 tablet (5 mg total) by mouth 2 (two) times daily. 04/24/18   Demetrios Loll, MD  budesonide-formoterol [facility]) 160-4.5 MCG/ACT inhaler Inhale 2 puffs into the lungs 2 (two) times daily. 04/24/18   Demetrios Loll, MD  celecoxib (CELEBREX) 200 MG capsule Take 1 capsule (200 mg total) by mouth daily. Patient not taking: Reported on  04/21/2018 07/23/15   Menshew, Dannielle Karvonen, PA-C  HYDROcodone-acetaminophen (NORCO/VICODIN) 5-325 MG tablet Take 1 tablet by mouth every 6 (six) hours as needed for moderate pain or severe pain. 04/28/18   Demetrios Loll, MD  metoprolol succinate (TOPROL XL) 50 MG 24 hr tablet Take 3 tablets (150 mg total) by mouth daily. Take with or immediately following a meal. 04/26/18 04/26/19  Demetrios Loll, MD  metoprolol succinate (TOPROL-XL) 25 MG 24 hr tablet Take 25 mg by mouth daily. 10/23/17 10/23/18  [provider]  potassium chloride SA (K-DUR,KLOR-CON) 20 MEQ tablet Take 1 tablet (20 mEq total) by mouth daily. Patient not taking: Reported on 04/21/2018 11/14/15   Forest Gleason, MD  predniSONE (DELTASONE) 20 MG tablet Take 2 tablets (40 mg total) by mouth daily with breakfast. 04/24/18   Demetrios Loll, MD    Inpatient Medications:  . citalopram  20 mg Oral Daily  . feeding supplement  1 Container Oral TID BM  . insulin aspart  0-9 Units Subcutaneous TID WC  . ipratropium-albuterol  3 mL Nebulization TID  . magnesium oxide  400 mg Oral Daily  . metoprolol tartrate  75 mg Oral BID  . mometasone-formoterol  2 puff Inhalation BID  . multivitamin with minerals  1 tablet Oral Daily  . sodium phosphate  1 enema Rectal Once   . heparin 950 Units/hr (04/28/18 1540)    Allergies: No Known Allergies  Social History   Socioeconomic History  . Marital status: Divorced    Spouse name: Not on file  . Number of children: Not on file  . Years of education: Not on file  . Highest education level: Not on file  Occupational History  . Not on file  Social Needs  . Financial resource strain: Not on file  . Food insecurity:    Worry: Not on file    Inability: Not on file  . Transportation needs:    Medical: Not on file    Non-medical: Not on file  Tobacco Use  . Smoking status: Former Smoker    Packs/day: 0.50    Years: 20.00    Pack years: 10.00    Types: Cigarettes    Last attempt to quit: 07/02/2012    Years since quitting: 5.8  . Smokeless tobacco: Never Used  . Tobacco comment: quit 2014  Substance and Sexual Activity  . Alcohol use: Yes    Comment: Occasionally  . Drug use: No  . Sexual activity: Never  Lifestyle  . Physical activity:    Days per week: Not on file    Minutes per session: Not on file  . Stress: Not on file  Relationships  . Social connections:    Talks on phone: Not on file    Gets together: Not on file    Attends religious service: Not on file    Active member of  club or organization: Not on file    Attends meetings of clubs or organizations: Not on file    Relationship status: Not on file  . Intimate partner violence:    Fear of current or ex partner: Not on file    Emotionally abused: Not on file    Physically abused: Not on file    Forced sexual activity: Not on file  Other Topics Concern  . Not on file  Social History Narrative  . Not on file     Family History  Problem Relation Age of Onset  . Breast cancer Mother 1  .  Cancer Mother        Breast   . Cirrhosis Father   . Breast cancer Paternal Aunt 70  . Cancer Maternal Aunt        Breast      Review of Systems Positive for abdominal discomfort Negative for: General:  chills, fever, night sweats or weight changes.  Cardiovascular: PND orthopnea syncope dizziness  Dermatological skin lesions rashes Respiratory: Cough congestion Urologic: Frequent urination urination at night and hematuria Abdominal: negative for   vomiting, diarrhea, bright red blood per rectum, melena, or hematemesis Neurologic: negative for visual changes, and/or hearing changes  All other systems reviewed and are otherwise negative except as noted above.  Labs: No results for input(s): CKTOTAL, CKMB, TROPONINI in the last 72 hours. Lab Results  Component Value Date   WBC 5.3 04/28/2018   HGB 8.5 (L) 04/28/2018   HCT 25.3 (L) 04/28/2018   MCV 101.9 (H) 04/28/2018   PLT 282 04/28/2018    Recent Labs  Lab 04/23/18 0600  04/28/18 0610  NA 133*   < > 131*  K 3.3*   < > 4.7  CL 102   < > 99  CO2 20*   < > 23  BUN 31*   < > 28*  CREATININE 1.54*   < > 0.98  CALCIUM 6.8*   < > 8.8*  PROT 6.6  --   --   BILITOT 2.2*  --   --   ALKPHOS 85  --   --   ALT 78*  --   --   AST 107*  --   --   GLUCOSE 146*   < > 97   < > = values in this interval not displayed.   No results found for: CHOL, HDL, LDLCALC, TRIG No results found for: DDIMER  Radiology/Studies:  Ct Abdomen Pelvis Wo Contrast  Result  Date: 04/27/2018 CLINICAL DATA:  55F acute hypoxemic respiratory failure, acute COPD exacerbation, dehydration, hypokalemia, hypoglycemia, renal failure, hypocalcemia, hypoalbuminemia, transaminasemia/hyperbilirubinemia, BNP elevation, Troponin-I elevation, macrocytic anemiaHx of lung and breast cancer EXAM: CT ABDOMEN AND PELVIS WITHOUT CONTRAST TECHNIQUE: Multidetector CT imaging of the abdomen and pelvis was performed following the standard protocol without IV contrast. COMPARISON:  PET-CT, 07/13/2016 FINDINGS: Lower chest: Small right pleural effusion. Minor dependent right lower lobe atelectasis. No evidence of pneumonia or pulmonary edema. Hepatobiliary: Markedly decreased attenuation of the liver consistent with extensive fatty infiltration. No liver mass or focal lesion. Gallbladder is contracted but otherwise unremarkable. No bile duct dilation. Pancreas: Unremarkable. No pancreatic ductal dilatation or surrounding inflammatory changes. Spleen: Small spleen.  No splenic mass or focal lesion. Adrenals/Urinary Tract: Adrenal glands are unremarkable. Kidneys are normal, without renal calculi, focal lesion, or hydronephrosis. Bladder is unremarkable. Stomach/Bowel: There is irregular wall thickening with associated narrowing of the ascending colon at and just inferior to the hepatic flexure, extending for 7-8 cm in length. Small area of inflammatory type stranding is seen along the lateral margin of this portion of the colon abutting the inferior margin of the liver. No other colonic wall thickening or evidence of inflammation. Stomach and small bowel are unremarkable. Cecum is distended to 6.6 cm. Normal appendix is visualized. Vascular/Lymphatic: Extensive aortic atherosclerotic calcifications extending into the branch vessels. No aneurysm. No adenopathy. Reproductive: Uterus and bilateral adnexa are unremarkable. Other: No abdominal wall hernia or abnormality. No abdominopelvic ascites. Musculoskeletal: Mild  compression deformity of L5, of unclear chronicity, but knows likely chronic. There is no associated periatrial edema or  hemorrhage. No other fractures. No osteoblastic or osteolytic lesions. IMPRESSION: 1. Focal area of irregular wall thickening and relative narrowing of the right colon at and just below the hepatic flexure extending over approximately 7-8 cm. This may be infectious or inflammatory or neoplastic in etiology. Recommend follow-up colonoscopy. No other evidence of an acute abnormality. 2. Extensive hepatic steatosis. 3. Small right pleural effusion. 4. Extensive aortic atherosclerosis. 5. Mild compression fracture of L5 of unclear chronicity but most likely chronic. Electronically Signed   By: Lajean Manes M.D.   On: 04/27/2018 14:56   Ct Chest Wo Contrast  Result Date: 04/21/2018 CLINICAL DATA:  62 year old female with a history of abnormal chest x-ray and prior known right-sided lung carcinoma status post treatment EXAM: CT CHEST WITHOUT CONTRAST TECHNIQUE: Multidetector CT imaging of the chest was performed following the standard protocol without IV contrast. COMPARISON:  Chest x-ray today's date, prior CT 09/12/2017, 03/01/2017, PET-CT 07/13/2016 FINDINGS: Cardiovascular: Cardiac size unchanged. No pericardial fluid/thickening. Dense calcifications of left main, left anterior descending, circumflex, right coronary arteries. Calcifications of the aortic valve. Calcifications of the transverse arch and descending thoracic aorta. No aneurysm. Incidental calcifications of the suprarenal aorta of the upper abdomen. Diameter of the main pulmonary artery unremarkable. Mediastinum/Nodes: Small lymph nodes of the mediastinum, including paratracheal nodes unchanged in number from the comparison CT. Unchanged subcarinal lymph node. Lungs/Pleura: The abnormality on today's chest x-ray corresponds 2 consolidation/architectural distortion and confluent opacity of the right upper lobe which is more  pronounced than the comparison CT of 09/12/2017. Volume loss with linear and nodular opacity of the right upper lobe, extends along the peribronchovascular Cher of right upper lobe bronchi to the pleura anteriorly and medially. Nodular changes are inseparable from the hilar structures with volume loss and contraction of the proximal airways of the right upper lobe. Bronchus intermedius and the middle lobe bronchi remain patent. Right lower lobe airways remain patent. No confluent airspace disease. Diameter of the nodularity on image 47 measures 3.5 cm x 2.9 cm, and more inferiorly measures 4.8 cm x 3.5 cm. All of these changes are in the treated area from the prior CT. Left lung demonstrates similar emphysematous changes and scarring at the apex. No new nodule/mass identified on the left. No confluent airspace disease. Small right greater than left pleural effusions, new from the prior. Upper Abdomen: Steatosis. Musculoskeletal: No acute displaced fracture. Degenerative changes of the spine. No new aggressive bony lesion identified. IMPRESSION: The abnormality of this chest x-ray corresponds to nodular/consolidative changes of the right upper lobe with architectural distortion in the area of prior therapy. 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 Correlation with the patient's prior treatment may be useful, as well as potential future imaging with contrast-enhanced CT chest or alternatively PET-CT. Small lymph nodes of the mediastinum, without increasing lymphadenopathy. Small right greater than left pleural effusion. Emphysema.  Emphysema (ICD10-J43.9). Aortic atherosclerosis with left main and 3 vessel coronary artery disease. Aortic Atherosclerosis (ICD10-I70.0). Hepatic steatosis. Electronically Signed   By: Corrie Mckusick D.O.   On: 04/21/2018 17:28   US Venous  Img Lower Bilateral  Result Date: 04/21/2018 CLINICAL DATA:  Bilateral lower extremity pain and edema. History of fall 2 weeks ago. History of breast cancer. Evaluate for DVT. EXAM: BILATERAL LOWER EXTREMITY VENOUS DOPPLER ULTRASOUND TECHNIQUE: Gray-scale sonography with graded compression, as well as color Doppler and duplex ultrasound were performed  to evaluate the lower extremity deep venous systems from the level of the common femoral vein and including the common femoral, femoral, profunda femoral, popliteal and calf veins including the posterior tibial, peroneal and gastrocnemius veins when visible. The superficial great saphenous vein was also interrogated. Spectral Doppler was utilized to evaluate flow at rest and with distal augmentation maneuvers in the common femoral, femoral and popliteal veins. COMPARISON:  None. FINDINGS: RIGHT LOWER EXTREMITY Common Femoral Vein: No evidence of thrombus. Normal compressibility, respiratory phasicity and response to augmentation. Saphenofemoral Junction: No evidence of thrombus. Normal compressibility and flow on color Doppler imaging. Profunda Femoral Vein: No evidence of thrombus. Normal compressibility and flow on color Doppler imaging. Femoral Vein: No evidence of thrombus. Normal compressibility, respiratory phasicity and response to augmentation. Popliteal Vein: No evidence of thrombus. Normal compressibility, respiratory phasicity and response to augmentation. Calf Veins: No evidence of thrombus. Normal compressibility and flow on color Doppler imaging. Superficial Great Saphenous Vein: No evidence of thrombus. Normal compressibility. Venous Reflux:  None. Other Findings:  None. LEFT LOWER EXTREMITY Common Femoral Vein: No evidence of thrombus. Normal compressibility, respiratory phasicity and response to augmentation. Saphenofemoral Junction: No evidence of thrombus. Normal compressibility and flow on color Doppler imaging. Profunda Femoral Vein: No evidence  of thrombus. Normal compressibility and flow on color Doppler imaging. Femoral Vein: No evidence of thrombus. Normal compressibility, respiratory phasicity and response to augmentation. Popliteal Vein: No evidence of thrombus. Normal compressibility, respiratory phasicity and response to augmentation. Calf Veins: No evidence of thrombus. Normal compressibility and flow on color Doppler imaging. Superficial Great Saphenous Vein: No evidence of thrombus. Normal compressibility. Venous Reflux:  None. Other Findings:  None. IMPRESSION: No evidence of DVT within either lower extremity. Electronically Signed   By: Sandi Mariscal M.D.   On: 04/21/2018 14:13   Dg Chest Port 1 View  Result Date: 04/21/2018 CLINICAL DATA:  Initial evaluation for acute shortness of breath, history of COPD. EXAM: PORTABLE CHEST 1 VIEW COMPARISON:  Prior radiograph from 04/19/2017 as well as prior CT from 09/12/2017. FINDINGS: Mild cardiomegaly, stable. Aortic atherosclerosis. Increased masslike density at the right hilum measuring approximately 3.7 x 3.5 cm. Adjacent right perihilar scarring and architectural distortion. No other focal infiltrates. No pleural effusion or pulmonary edema. Chronic bronchitic changes. No pneumothorax. No acute osseous abnormality. IMPRESSION: 1. Increased prominence of 3.5 cm mass like opacity at the right hilum, concerning for worsened malignancy. Further assessment with cross-sectional imaging of the chest suggested for further characterization. 2. Underlying chronic emphysematous changes. 3. Cardiomegaly with aortic atherosclerosis. Electronically Signed   By: Jeannine Boga M.D.   On: 04/21/2018 04:54   Dg Abd Portable 1v  Result Date: 04/28/2018 CLINICAL DATA:  62 y.o. female with history of COPD, lung cancer status post SBRT, admitted with worsening shortness of breath and cough. GI consulted for CT abdomen revealing ascending colon thickening.Pt. Reports right upper quadrant, right mid quadrant  abdominal pain. Cramping, intermittent, 5/10, nonradiating, associated with nausea. No vomiting.Reports having a bowel movement on this admission, and passing gas. No obstructive symptoms. Poor appetite. EXAM: PORTABLE ABDOMEN - 1 VIEW COMPARISON:  CT, 04/27/2018. FINDINGS: There is no bowel dilation to suggest obstruction or significant adynamic ileus. There are scattered aortic and iliac artery vascular calcifications. Soft tissues are otherwise unremarkable. Residual contrast is noted in the colon. No acute skeletal abnormality IMPRESSION: 1. No acute findings.  No evidence of bowel obstruction. Electronically Signed   By: Lajean Manes M.D.   On: 04/28/2018 15:26  US Abdomen Limited Ruq  Result Date: 04/23/2018 CLINICAL DATA:  Elevated bilirubin. EXAM: ULTRASOUND ABDOMEN LIMITED RIGHT UPPER QUADRANT COMPARISON:  PET-CT dated July 13, 2016. FINDINGS: Gallbladder: Contracted. No gallstones or wall thickening visualized. No sonographic Murphy sign noted by sonographer. Common bile duct: Diameter: 6-7 mm, at the upper limits of normal in size. Liver: No focal lesion identified. Diffusely increased in parenchymal echogenicity. 9 mm simple cyst in the right hepatic lobe. Portal vein is patent on color Doppler imaging with normal direction of blood flow towards the liver. IMPRESSION: 1. No acute abnormality. 2. Severe hepatic steatosis. Electronically Signed   By: Titus Dubin M.D.   On: 04/23/2018 16:34    EKG: Normal sinus rhythm  Weights: Filed Weights   04/22/18 1317 04/25/18 0357 04/26/18 0259  Weight: 141 lb 3.2 oz (64 kg) 147 lb 3.2 oz (66.8 kg) 146 lb 3.2 oz (66.3 kg)     Physical Exam: Blood pressure 118/79, pulse 75, temperature 97.7 F (36.5 C), temperature source Oral, resp. rate 16, height 5\' 6"  (1.676 m), weight 146 lb 3.2 oz (66.3 kg), SpO2 100 %. Body mass index is 23.6 kg/m. General: Well developed, well nourished, in no acute distress. Head eyes ears nose throat:  Normocephalic, atraumatic, sclera non-icteric, no xanthomas, nares are without discharge. No apparent thyromegaly and/or mass  Lungs: Normal respiratory effort.  no wheezes, no rales, no rhonchi.  Heart: RRR with normal S1 S2.  2-3+ apical murmur gallop, no rub, PMI is enlarged size and placement, carotid upstroke normal without bruit, jugular venous pressure is normal Abdomen: Soft, non-tender, non-distended with normoactive bowel sounds. No hepatomegaly. No rebound/guarding. No obvious abdominal masses. Abdominal aorta is normal size without bruit Extremities: Trace to 1+ edema. no cyanosis, no clubbing, no ulcers  Peripheral : 2+ bilateral upper extremity pulses, 2+ bilateral femoral pulses, 2+ bilateral dorsal pedal pulse Neuro: Alert and oriented. No facial asymmetry. No focal deficit. Moves all extremities spontaneously. Musculoskeletal: Normal muscle tone without kyphosis Psych:  Responds to questions appropriately with a normal affect.    Assessment: 61 year old female with mild dilated cardiomyopathy paroxysmal nonvalvular atrial fibrillation essential hypertension mixed hyperlipidemia currently well controlled on appropriate medication management at this time having abdominal pain as well as needing further upper endoscopy.  Patient is at lowest risk possible for further evaluation and endoscopy  Plan: 1.  Proceed to endoscopy without restriction 2.  The patient may stop Eliquis 1 to 2 days prior to endoscopy to lower bleeding potential and complication risk and restart as soon as possible thereafter due to patient maintaining normal sinus rhythm 3.  Continue metoprolol for maintenance of normal sinus rhythm and controlled heart rate 3.  No further cardiac diagnostics necessary at this time due to no evidence of true angina and/or congestive heart failure with known cardiomyopathy on appropriate medication management  Signed, Corey Skains M.D. [facility]  Cardiology 04/28/2018, 5:13 PM

## 2018-04-28 NOTE — Progress Notes (Signed)
ANTICOAGULATION CONSULT NOTE   Pharmacy Consult for heparin Indication: atrial fibrillation  No Known Allergies  Patient Measurements: Height: 5\' 6"  (167.6 cm) Weight: 146 lb 3.2 oz (66.3 kg) IBW/kg (Calculated) : 59.3 Heparin Dosing Weight: 66.3 kg   Vital Signs: Temp: 97.7 F (36.5 C) (07/15 0742) Temp Source: Oral (07/15 0742) BP: 122/91 (07/15 0742) Pulse Rate: 66 (07/15 0742)  Labs: Recent Labs    04/26/18 0546 04/28/18 0610  CREATININE 1.33* 0.98    Estimated Creatinine Clearance: 54.3 mL/min (by C-G formula based on SCr of 0.98 mg/dL).   Medical History: Past Medical History:  Diagnosis Date  . Anxiety   . Arthritis   . Cancer ([place]) left    breast cancer 2000, chemo tx's with total mastectomy and lymph nodes resected.   . Cancer of right lung ([place]) 02/21/2016   rad tx's.   [name] COPD (chronic obstructive pulmonary disease) ([place])   . Dependence on supplemental oxygen   . Depression   . Heart murmur   . Hypertension   . Lung nodule   . Lymphedema   . Shortness of breath dyspnea    with exertion  . Status post chemotherapy 2001   left breast cancer  . Status post radiation therapy 2001   left breast cancer    Medications:  Patient was started on apixaban 5mg  BID while inpatient for new onset afib. Last dose of apixaban 5mg  was 7/14 2049.   Assessment: 62 yo female admitted with SOB, cough, and abdominal pain found to have thickening of ascending colon. Patient diagnosed with afib and started on apixaban and cardizem. GI consult obtained and pending cardiology clear, patient will undergo colonoscopy. Apixaban now on hold pending colonoscopy. Pharmacy consulted to dose and monitor heparin.   Goal of Therapy:  Heparin level 0.3-0.7 units/ml Monitor platelets by anticoagulation protocol: Yes   Plan:  Will not bolus due to apixaban bolus being given last night.  Start heparin infusion at 950 units/hr Check anti-Xa level in 6 hours and daily while on  heparin Continue to monitor H&H and platelets  Will obtain HL and APTT until levels correlate. Baseline APTT, PT INR, and HL ordered.   Lendon Ka, PharmD 04/28/2018,3:04 PM

## 2018-04-29 DIAGNOSIS — R1084 Generalized abdominal pain: Secondary | ICD-10-CM

## 2018-04-29 DIAGNOSIS — K529 Noninfective gastroenteritis and colitis, unspecified: Secondary | ICD-10-CM

## 2018-04-29 DIAGNOSIS — K639 Disease of intestine, unspecified: Secondary | ICD-10-CM

## 2018-04-29 LAB — CBC
HCT: 24.6 % — ABNORMAL LOW (ref 35.0–47.0)
Hemoglobin: 8.4 g/dL — ABNORMAL LOW (ref 12.0–16.0)
MCH: 35.1 pg — AB (ref 26.0–34.0)
MCHC: 34.3 g/dL (ref 32.0–36.0)
MCV: 102.4 fL — AB (ref 80.0–100.0)
Platelets: 267 10*3/uL (ref 150–440)
RBC: 2.4 MIL/uL — AB (ref 3.80–5.20)
RDW: 15.7 % — ABNORMAL HIGH (ref 11.5–14.5)
WBC: 5 10*3/uL (ref 3.6–11.0)

## 2018-04-29 LAB — APTT
aPTT: 137 seconds — ABNORMAL HIGH (ref 24–36)
aPTT: 128 seconds — ABNORMAL HIGH (ref 24–36)
aPTT: 96 seconds — ABNORMAL HIGH (ref 24–36)

## 2018-04-29 LAB — GLUCOSE, CAPILLARY
GLUCOSE-CAPILLARY: 168 mg/dL — AB (ref 70–99)
Glucose-Capillary: 78 mg/dL (ref 70–99)
Glucose-Capillary: 89 mg/dL (ref 70–99)
Glucose-Capillary: 50 mg/dL — ABNORMAL LOW (ref 70–99)
Glucose-Capillary: 92 mg/dL (ref 70–99)

## 2018-04-29 LAB — HEPARIN LEVEL (UNFRACTIONATED)
HEPARIN UNFRACTIONATED: 1.31 [IU]/mL — AB (ref 0.30–0.70)
Heparin Unfractionated: 2.2 IU/mL — ABNORMAL HIGH (ref 0.30–0.70)
Heparin Unfractionated: 2.28 IU/mL — ABNORMAL HIGH (ref 0.30–0.70)
Heparin Unfractionated: 1.66 IU/mL — ABNORMAL HIGH (ref 0.30–0.70)

## 2018-04-29 MED ORDER — VITAMIN B-1 100 MG PO TABS
100.0000 mg | ORAL_TABLET | Freq: Every day | ORAL | Status: DC
  Administered 2018-04-29 – 2018-05-03 (×4): 100 mg via ORAL
  Filled 2018-04-29 (×4): qty 1

## 2018-04-29 MED ORDER — FOLIC ACID 1 MG PO TABS
1.0000 mg | ORAL_TABLET | Freq: Every day | ORAL | Status: DC
  Administered 2018-04-29 – 2018-05-04 (×6): 1 mg via ORAL
  Filled 2018-04-29 (×7): qty 1

## 2018-04-29 MED ORDER — PEG 3350-KCL-NA BICARB-NACL 420 G PO SOLR
4000.0000 mL | Freq: Once | ORAL | Status: AC
  Administered 2018-04-29: 4000 mL via ORAL
  Filled 2018-04-29: qty 4000

## 2018-04-29 NOTE — Progress Notes (Signed)
Danielle Antigua, MD [address], [place], [place], [place], [address] [address], [place], [place], [place], [address] Phone: [phone]  Fax: [phone]   Subjective: Patient continues to report right upper quadrant abdominal pain.  No nausea or vomiting.  Tolerating clear liquid diet   Objective: Exam: Vital signs in last 24 hours: Vitals:   04/28/18 2010 04/29/18 0428 04/29/18 0723 04/29/18 0752  BP:  127/83  135/86  Pulse:  64  72  Resp:  18    Temp:  (!) 97.4 F (36.3 C)  (!) 97.5 F (36.4 C)  TempSrc:  Oral  Oral  SpO2: 96% 100% 98% 100%  Weight:      Height:       Weight change:   Intake/Output Summary (Last 24 hours) at 04/29/2018 0859 Last data filed at 04/29/2018 5027 Gross per 24 hour  Intake 311.72 ml  Output 1950 ml  Net -1638.28 ml    General: No acute distress, AAO x3 Abd: Soft, mildly tender to palpation right upper quadrant, nondistended , No HSM Skin: Warm, no rashes Neck: Supple, Trachea midline   Lab Results: Lab Results  Component Value Date   WBC 5.0 04/29/2018   HGB 8.4 (L) 04/29/2018   HCT 24.6 (L) 04/29/2018   MCV 102.4 (H) 04/29/2018   PLT 267 04/29/2018   Micro Results: Recent Results (from the past 240 hour(s))  MRSA PCR Screening     Status: None   Collection Time: 04/21/18  6:53 AM  Result Value Ref Range Status   MRSA by PCR NEGATIVE NEGATIVE Final    Comment:        The GeneXpert MRSA Assay (FDA approved for NASAL specimens only), is one component of a comprehensive MRSA colonization surveillance program. It is not intended to diagnose MRSA infection nor to guide or monitor treatment for MRSA infections. Performed at [facility], [address] [place], [place] [address]   C difficile quick scan w PCR reflex     Status: None   Collection Time: 04/27/18  2:36 PM  Result Value Ref Range Status   C Diff antigen NEGATIVE NEGATIVE Final   C Diff toxin NEGATIVE NEGATIVE Final   C Diff  interpretation No C. difficile detected.  Final    Comment: Performed at [facility], [address] [place], [place] [address]   Studies/Results: Ct Abdomen Pelvis Wo Contrast  Result Date: 04/27/2018 CLINICAL DATA:  62F acute hypoxemic respiratory failure, acute COPD exacerbation, dehydration, hypokalemia, hypoglycemia, renal failure, hypocalcemia, hypoalbuminemia, transaminasemia/hyperbilirubinemia, BNP elevation, Troponin-I elevation, macrocytic anemiaHx of lung and breast cancer EXAM: CT ABDOMEN AND PELVIS WITHOUT CONTRAST TECHNIQUE: Multidetector CT imaging of the abdomen and pelvis was performed following the standard protocol without IV contrast. COMPARISON:  PET-CT, 07/13/2016 FINDINGS: Lower chest: Small right pleural effusion. Minor dependent right lower lobe atelectasis. No evidence of pneumonia or pulmonary edema. Hepatobiliary: Markedly decreased attenuation of the liver consistent with extensive fatty infiltration. No liver mass or focal lesion. Gallbladder is contracted but otherwise unremarkable. No bile duct dilation. Pancreas: Unremarkable. No pancreatic ductal dilatation or surrounding inflammatory changes. Spleen: Small spleen.  No splenic mass or focal lesion. Adrenals/Urinary Tract: Adrenal glands are unremarkable. Kidneys are normal, without renal calculi, focal lesion, or hydronephrosis. Bladder is unremarkable. Stomach/Bowel: There is irregular wall thickening with associated narrowing of the ascending colon at and just inferior to the hepatic flexure, extending for 7-8 cm in length. Small area of inflammatory type stranding is seen along the  lateral margin of this portion of the colon abutting the inferior margin of the liver. No other colonic wall thickening or evidence of inflammation. Stomach and small bowel are unremarkable. Cecum is distended to 6.6 cm. Normal appendix is visualized. Vascular/Lymphatic: Extensive aortic atherosclerotic calcifications extending into  the branch vessels. No aneurysm. No adenopathy. Reproductive: Uterus and bilateral adnexa are unremarkable. Other: No abdominal wall hernia or abnormality. No abdominopelvic ascites. Musculoskeletal: Mild compression deformity of L5, of unclear chronicity, but knows likely chronic. There is no associated periatrial edema or hemorrhage. No other fractures. No osteoblastic or osteolytic lesions. IMPRESSION: 1. Focal area of irregular wall thickening and relative narrowing of the right colon at and just below the hepatic flexure extending over approximately 7-8 cm. This may be infectious or inflammatory or neoplastic in etiology. Recommend follow-up colonoscopy. No other evidence of an acute abnormality. 2. Extensive hepatic steatosis. 3. Small right pleural effusion. 4. Extensive aortic atherosclerosis. 5. Mild compression fracture of L5 of unclear chronicity but most likely chronic. Electronically Signed   By: Lajean Manes M.D.   On: 04/27/2018 14:56   Dg Abd Portable 1v  Result Date: 04/28/2018 CLINICAL DATA:  62 y.o. female with history of COPD, lung cancer status post SBRT, admitted with worsening shortness of breath and cough. GI consulted for CT abdomen revealing ascending colon thickening.Pt. Reports right upper quadrant, right mid quadrant abdominal pain. Cramping, intermittent, 5/10, nonradiating, associated with nausea. No vomiting.Reports having a bowel movement on this admission, and passing gas. No obstructive symptoms. Poor appetite. EXAM: PORTABLE ABDOMEN - 1 VIEW COMPARISON:  CT, 04/27/2018. FINDINGS: There is no bowel dilation to suggest obstruction or significant adynamic ileus. There are scattered aortic and iliac artery vascular calcifications. Soft tissues are otherwise unremarkable. Residual contrast is noted in the colon. No acute skeletal abnormality IMPRESSION: 1. No acute findings.  No evidence of bowel obstruction. Electronically Signed   By: Lajean Manes M.D.   On: 04/28/2018 15:26    Medications:  Scheduled Meds: . citalopram  20 mg Oral Daily  . feeding supplement  1 Container Oral TID BM  . insulin aspart  0-9 Units Subcutaneous TID WC  . ipratropium-albuterol  3 mL Nebulization TID  . magnesium oxide  400 mg Oral Daily  . metoprolol tartrate  75 mg Oral BID  . mometasone-formoterol  2 puff Inhalation BID  . multivitamin with minerals  1 tablet Oral Daily  . polyethylene glycol-electrolytes  4,000 mL Oral Once  . sodium phosphate  1 enema Rectal Once   Continuous Infusions: . heparin 700 Units/hr (04/29/18 0733)   PRN Meds:.acetaminophen **OR** acetaminophen, bisacodyl, bisacodyl, dextrose, HYDROcodone-acetaminophen, ipratropium-albuterol, morphine injection, ondansetron **OR** ondansetron (ZOFRAN) IV, senna-docusate   Assessment: 63 year old female admitted with shortness of breath, requiring BiPAP for COPD exacerbation, and CHF exacerbation, now stable on floor on nasal cannula   Plan: Cardiology has cleared the patient for colonoscopy for evaluation of ascending colon thickening seen on CT We will plan for colonoscopy tomorrow Clear liquid diet today Prep ordered Continue to hold Eliquis until procedure I have discussed alternative options, risks & benefits,  which include, but are not limited to, bleeding, infection, perforation,respiratory complication & drug reaction.  The patient agrees with this plan & written consent will be obtained.      LOS: 8 days   Danielle Antigua, MD 04/29/2018, 8:59 AM

## 2018-04-29 NOTE — Progress Notes (Signed)
ANTICOAGULATION CONSULT NOTE   Pharmacy Consult for heparin Indication: atrial fibrillation  No Known Allergies  Patient Measurements: Height: 5\' 6"  (167.6 cm) Weight: 146 lb 3.2 oz (66.3 kg) IBW/kg (Calculated) : 59.3 Heparin Dosing Weight: 66.3 kg   Vital Signs: Temp: 97.5 F (36.4 C) (07/16 0752) Temp Source: Oral (07/16 0752) BP: 135/86 (07/16 0752) Pulse Rate: 72 (07/16 0752)  Labs: Recent Labs    04/28/18 0610  04/28/18 1508 04/28/18 2216 04/29/18 0605 04/29/18 1204  HGB  --   --  8.5*  --  8.4*  --   HCT  --   --  25.3*  --  24.6*  --   PLT  --   --  282  --  267  --   APTT  --    < > 37* 147* 137* 128*  LABPROT  --   --  16.0*  --   --   --   INR  --   --  1.29  --   --   --   HEPARINUNFRC  --    < > 3.00* 2.20* 2.28* 1.66*  CREATININE 0.98  --   --   --   --   --    < > = values in this interval not displayed.    Estimated Creatinine Clearance: 54.3 mL/min (by C-G formula based on SCr of 0.98 mg/dL).   Medical History: Past Medical History:  Diagnosis Date  . Anxiety   . Arthritis   . Cancer ([place]) left    breast cancer 2000, chemo tx's with total mastectomy and lymph nodes resected.   . Cancer of right lung ([place]) 02/21/2016   rad tx's.   [name] COPD (chronic obstructive pulmonary disease) ([place])   . Dependence on supplemental oxygen   . Depression   . Heart murmur   . Hypertension   . Lung nodule   . Lymphedema   . Shortness of breath dyspnea    with exertion  . Status post chemotherapy 2001   left breast cancer  . Status post radiation therapy 2001   left breast cancer    Medications:  Patient was started on apixaban 5mg  BID while inpatient for new onset afib. Last dose of apixaban 5mg  was 7/14 2049.   Assessment: 62 yo female admitted with SOB, cough, and abdominal pain found to have thickening of ascending colon. Patient diagnosed with afib and started on apixaban and cardizem. GI consult obtained and pending cardiology clear, patient will  undergo colonoscopy. Apixaban now on hold pending colonoscopy. Pharmacy consulted to dose and monitor heparin.   Goal of Therapy:  Heparin level 0.3-0.7 units/ml Monitor platelets by anticoagulation protocol: Yes   Plan:  07/15 @ 2200 aPTT 147 sec supratherapeutic. Will decrease rate to 800 units/hr and will recheck aPTT/HL @ 0600. CBC check in the morning.  07/16 @ 0600 aPTT 137 supratherapeutic. Will decrease rate to 700 units/hr and will recheck anti-Xa @ 1200. CBC stable will continue to monitor. Awaiting HL to determine correlation  07/16 @1200  APTT 128 supratherapeutic with goal APTT of 66-102. Will decrease rate to 600 units/hr and recheck APTT/HL in 6 hours. CBC stable. Pharmacy will continue to monitor.   Lendon Ka, PharmD Pharmacy Resident 04/29/2018

## 2018-04-29 NOTE — Progress Notes (Signed)
[facility] Encounter Note  Patient: Danielle [name] / Admit Date: 04/21/2018 / Date of Encounter: 04/29/2018, 5:12 PM   Subjective: Renewed concerns of abdominal discomfort waxing and waning but no evidence of true cardiac symptoms today.  Patient has telemetry showing normal sinus rhythm maintaining normal rhythm with metoprolol.  Eliquis being held at this time for upper endoscopy  Review of Systems: Positive for: Abdominal discomfort Negative for: Vision change, hearing change, syncope, dizziness, nausea, vomiting,diarrhea, bloody stool,  cough, congestion, diaphoresis, urinary frequency, urinary pain,skin lesions, skin rashes Others previously listed  Objective: Telemetry: Normal sinus rhythm Physical Exam: Blood pressure 135/86, pulse 72, temperature (!) 97.5 F (36.4 C), temperature source Oral, resp. rate 18, height 5\' 6"  (1.676 m), weight 146 lb 3.2 oz (66.3 kg), SpO2 100 %. Body mass index is 23.6 kg/m. General: Well developed, well nourished, in no acute distress. Head: Normocephalic, atraumatic, sclera non-icteric, no xanthomas, nares are without discharge. Neck: No apparent masses Lungs: Normal respirations with no wheezes, no rhonchi, no rales , no crackles   Heart: Regular rate and rhythm, normal S1 S2, no murmur, no rub, no gallop, PMI is normal size and placement, carotid upstroke normal without bruit, jugular venous pressure normal Abdomen: Soft, non-tender, non-distended with normoactive bowel sounds. No hepatosplenomegaly. Abdominal aorta is normal size without bruit Extremities: No edema, no clubbing, no cyanosis, no ulcers,  Peripheral: 2+ radial, 2+ femoral, 2+ dorsal pedal pulses Neuro: Alert and oriented. Moves all extremities spontaneously. Psych:  Responds to questions appropriately with a normal affect.   Intake/Output Summary (Last 24 hours) at 04/29/2018 1712 Last data filed at 04/29/2018 1352 Gross per 24 hour  Intake 311.72 ml   Output 1550 ml  Net -1238.28 ml    Inpatient Medications:  . citalopram  20 mg Oral Daily  . feeding supplement  1 Container Oral TID BM  . folic acid  1 mg Oral Daily  . insulin aspart  0-9 Units Subcutaneous TID WC  . ipratropium-albuterol  3 mL Nebulization TID  . magnesium oxide  400 mg Oral Daily  . metoprolol tartrate  75 mg Oral BID  . mometasone-formoterol  2 puff Inhalation BID  . multivitamin with minerals  1 tablet Oral Daily  . sodium phosphate  1 enema Rectal Once  . thiamine  100 mg Oral Daily   Infusions:  . heparin 600 Units/hr (04/29/18 1327)    Labs: Recent Labs    04/27/18 1212 04/28/18 0610  NA  --  131*  K 5.0 4.7  CL  --  99  CO2  --  23  GLUCOSE  --  97  BUN  --  28*  CREATININE  --  0.98  CALCIUM  --  8.8*   No results for input(s): AST, ALT, ALKPHOS, BILITOT, PROT, ALBUMIN in the last 72 hours. Recent Labs    04/28/18 1508 04/29/18 0605  WBC 5.3 5.0  HGB 8.5* 8.4*  HCT 25.3* 24.6*  MCV 101.9* 102.4*  PLT 282 267   No results for input(s): CKTOTAL, CKMB, TROPONINI in the last 72 hours. Invalid input(s): POCBNP No results for input(s): HGBA1C in the last 72 hours.   Weights: Filed Weights   04/22/18 1317 04/25/18 0357 04/26/18 0259  Weight: 141 lb 3.2 oz (64 kg) 147 lb 3.2 oz (66.8 kg) 146 lb 3.2 oz (66.3 kg)     Radiology/Studies:  Ct Abdomen Pelvis Wo Contrast  Result Date: 04/27/2018 CLINICAL DATA:  23F acute hypoxemic respiratory failure,  acute COPD exacerbation, dehydration, hypokalemia, hypoglycemia, renal failure, hypocalcemia, hypoalbuminemia, transaminasemia/hyperbilirubinemia, BNP elevation, Troponin-I elevation, macrocytic anemiaHx of lung and breast cancer EXAM: CT ABDOMEN AND PELVIS WITHOUT CONTRAST TECHNIQUE: Multidetector CT imaging of the abdomen and pelvis was performed following the standard protocol without IV contrast. COMPARISON:  PET-CT, 07/13/2016 FINDINGS: Lower chest: Small right pleural effusion. Minor  dependent right lower lobe atelectasis. No evidence of pneumonia or pulmonary edema. Hepatobiliary: Markedly decreased attenuation of the liver consistent with extensive fatty infiltration. No liver mass or focal lesion. Gallbladder is contracted but otherwise unremarkable. No bile duct dilation. Pancreas: Unremarkable. No pancreatic ductal dilatation or surrounding inflammatory changes. Spleen: Small spleen.  No splenic mass or focal lesion. Adrenals/Urinary Tract: Adrenal glands are unremarkable. Kidneys are normal, without renal calculi, focal lesion, or hydronephrosis. Bladder is unremarkable. Stomach/Bowel: There is irregular wall thickening with associated narrowing of the ascending colon at and just inferior to the hepatic flexure, extending for 7-8 cm in length. Small area of inflammatory type stranding is seen along the lateral margin of this portion of the colon abutting the inferior margin of the liver. No other colonic wall thickening or evidence of inflammation. Stomach and small bowel are unremarkable. Cecum is distended to 6.6 cm. Normal appendix is visualized. Vascular/Lymphatic: Extensive aortic atherosclerotic calcifications extending into the branch vessels. No aneurysm. No adenopathy. Reproductive: Uterus and bilateral adnexa are unremarkable. Other: No abdominal wall hernia or abnormality. No abdominopelvic ascites. Musculoskeletal: Mild compression deformity of L5, of unclear chronicity, but knows likely chronic. There is no associated periatrial edema or hemorrhage. No other fractures. No osteoblastic or osteolytic lesions. IMPRESSION: 1. Focal area of irregular wall thickening and relative narrowing of the right colon at and just below the hepatic flexure extending over approximately 7-8 cm. This may be infectious or inflammatory or neoplastic in etiology. Recommend follow-up colonoscopy. No other evidence of an acute abnormality. 2. Extensive hepatic steatosis. 3. Small right pleural  effusion. 4. Extensive aortic atherosclerosis. 5. Mild compression fracture of L5 of unclear chronicity but most likely chronic. Electronically Signed   By: Lajean Manes M.D.   On: 04/27/2018 14:56   Ct Chest Wo Contrast  Result Date: 04/21/2018 CLINICAL DATA:  62 year old female with a history of abnormal chest x-ray and prior known right-sided lung carcinoma status post treatment EXAM: CT CHEST WITHOUT CONTRAST TECHNIQUE: Multidetector CT imaging of the chest was performed following the standard protocol without IV contrast. COMPARISON:  Chest x-ray today's date, prior CT 09/12/2017, 03/01/2017, PET-CT 07/13/2016 FINDINGS: Cardiovascular: Cardiac size unchanged. No pericardial fluid/thickening. Dense calcifications of left main, left anterior descending, circumflex, right coronary arteries. Calcifications of the aortic valve. Calcifications of the transverse arch and descending thoracic aorta. No aneurysm. Incidental calcifications of the suprarenal aorta of the upper abdomen. Diameter of the main pulmonary artery unremarkable. Mediastinum/Nodes: Small lymph nodes of the mediastinum, including paratracheal nodes unchanged in number from the comparison CT. Unchanged subcarinal lymph node. Lungs/Pleura: The abnormality on today's chest x-ray corresponds 2 consolidation/architectural distortion and confluent opacity of the right upper lobe which is more pronounced than the comparison CT of 09/12/2017. Volume loss with linear and nodular opacity of the right upper lobe, extends along the peribronchovascular Cher of right upper lobe bronchi to the pleura anteriorly and medially. Nodular changes are inseparable from the hilar structures with volume loss and contraction of the proximal airways of the right upper lobe. Bronchus intermedius and the middle lobe bronchi remain patent. Right lower lobe airways remain patent. No confluent airspace disease.  Diameter of the nodularity on image 47 measures 3.5 cm x 2.9 cm,  and more inferiorly measures 4.8 cm x 3.5 cm. All of these changes are in the treated area from the prior CT. Left lung demonstrates similar emphysematous changes and scarring at the apex. No new nodule/mass identified on the left. No confluent airspace disease. Small right greater than left pleural effusions, new from the prior. Upper Abdomen: Steatosis. Musculoskeletal: No acute displaced fracture. Degenerative changes of the spine. No new aggressive bony lesion identified. IMPRESSION: The abnormality of this chest x-ray corresponds to nodular/consolidative changes of the right upper lobe with architectural distortion in the area of prior therapy. 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 Correlation with the patient's prior treatment may be useful, as well as potential future imaging with contrast-enhanced CT chest or alternatively PET-CT. Small lymph nodes of the mediastinum, without increasing lymphadenopathy. Small right greater than left pleural effusion. Emphysema.  Emphysema (ICD10-J43.9). Aortic atherosclerosis with left main and 3 vessel coronary artery disease. Aortic Atherosclerosis (ICD10-I70.0). Hepatic steatosis. Electronically Signed   By: Corrie Mckusick D.O.   On: 04/21/2018 17:28   US Venous Img Lower Bilateral  Result Date: 04/21/2018 CLINICAL DATA:  Bilateral lower extremity pain and edema. History of fall 2 weeks ago. History of breast cancer. Evaluate for DVT. EXAM: BILATERAL LOWER EXTREMITY VENOUS DOPPLER ULTRASOUND TECHNIQUE: Gray-scale sonography with graded compression, as well as color Doppler and duplex ultrasound were performed to evaluate the lower extremity deep venous systems from the level of the common femoral vein and including the common femoral, femoral, profunda femoral, popliteal and calf veins including the posterior tibial,  peroneal and gastrocnemius veins when visible. The superficial great saphenous vein was also interrogated. Spectral Doppler was utilized to evaluate flow at rest and with distal augmentation maneuvers in the common femoral, femoral and popliteal veins. COMPARISON:  None. FINDINGS: RIGHT LOWER EXTREMITY Common Femoral Vein: No evidence of thrombus. Normal compressibility, respiratory phasicity and response to augmentation. Saphenofemoral Junction: No evidence of thrombus. Normal compressibility and flow on color Doppler imaging. Profunda Femoral Vein: No evidence of thrombus. Normal compressibility and flow on color Doppler imaging. Femoral Vein: No evidence of thrombus. Normal compressibility, respiratory phasicity and response to augmentation. Popliteal Vein: No evidence of thrombus. Normal compressibility, respiratory phasicity and response to augmentation. Calf Veins: No evidence of thrombus. Normal compressibility and flow on color Doppler imaging. Superficial Great Saphenous Vein: No evidence of thrombus. Normal compressibility. Venous Reflux:  None. Other Findings:  None. LEFT LOWER EXTREMITY Common Femoral Vein: No evidence of thrombus. Normal compressibility, respiratory phasicity and response to augmentation. Saphenofemoral Junction: No evidence of thrombus. Normal compressibility and flow on color Doppler imaging. Profunda Femoral Vein: No evidence of thrombus. Normal compressibility and flow on color Doppler imaging. Femoral Vein: No evidence of thrombus. Normal compressibility, respiratory phasicity and response to augmentation. Popliteal Vein: No evidence of thrombus. Normal compressibility, respiratory phasicity and response to augmentation. Calf Veins: No evidence of thrombus. Normal compressibility and flow on color Doppler imaging. Superficial Great Saphenous Vein: No evidence of thrombus. Normal compressibility. Venous Reflux:  None. Other Findings:  None. IMPRESSION: No evidence of DVT within  either lower extremity. Electronically Signed   By: Sandi Mariscal M.D.   On: 04/21/2018 14:13   Dg Chest Port 1 View  Result Date: 04/21/2018 CLINICAL DATA:  Initial evaluation for acute shortness of breath, history  of COPD. EXAM: PORTABLE CHEST 1 VIEW COMPARISON:  Prior radiograph from 04/19/2017 as well as prior CT from 09/12/2017. FINDINGS: Mild cardiomegaly, stable. Aortic atherosclerosis. Increased masslike density at the right hilum measuring approximately 3.7 x 3.5 cm. Adjacent right perihilar scarring and architectural distortion. No other focal infiltrates. No pleural effusion or pulmonary edema. Chronic bronchitic changes. No pneumothorax. No acute osseous abnormality. IMPRESSION: 1. Increased prominence of 3.5 cm mass like opacity at the right hilum, concerning for worsened malignancy. Further assessment with cross-sectional imaging of the chest suggested for further characterization. 2. Underlying chronic emphysematous changes. 3. Cardiomegaly with aortic atherosclerosis. Electronically Signed   By: Jeannine Boga M.D.   On: 04/21/2018 04:54   Dg Abd Portable 1v  Result Date: 04/28/2018 CLINICAL DATA:  62 y.o. female with history of COPD, lung cancer status post SBRT, admitted with worsening shortness of breath and cough. GI consulted for CT abdomen revealing ascending colon thickening.Pt. Reports right upper quadrant, right mid quadrant abdominal pain. Cramping, intermittent, 5/10, nonradiating, associated with nausea. No vomiting.Reports having a bowel movement on this admission, and passing gas. No obstructive symptoms. Poor appetite. EXAM: PORTABLE ABDOMEN - 1 VIEW COMPARISON:  CT, 04/27/2018. FINDINGS: There is no bowel dilation to suggest obstruction or significant adynamic ileus. There are scattered aortic and iliac artery vascular calcifications. Soft tissues are otherwise unremarkable. Residual contrast is noted in the colon. No acute skeletal abnormality IMPRESSION: 1. No acute  findings.  No evidence of bowel obstruction. Electronically Signed   By: Lajean Manes M.D.   On: 04/28/2018 15:26   US Abdomen Limited Ruq  Result Date: 04/23/2018 CLINICAL DATA:  Elevated bilirubin. EXAM: ULTRASOUND ABDOMEN LIMITED RIGHT UPPER QUADRANT COMPARISON:  PET-CT dated July 13, 2016. FINDINGS: Gallbladder: Contracted. No gallstones or wall thickening visualized. No sonographic Murphy sign noted by sonographer. Common bile duct: Diameter: 6-7 mm, at the upper limits of normal in size. Liver: No focal lesion identified. Diffusely increased in parenchymal echogenicity. 9 mm simple cyst in the right hepatic lobe. Portal vein is patent on color Doppler imaging with normal direction of blood flow towards the liver. IMPRESSION: 1. No acute abnormality. 2. Severe hepatic steatosis. Electronically Signed   By: Titus Dubin M.D.   On: 04/23/2018 16:34     Assessment and Recommendation  62 y.o. female with known essential hypertension mixed hyperlipidemia mild dilated cardiomyopathy with paroxysmal nonvalvular atrial fibrillation currently stable at this time in normal rhythm with no evidence of new cardiac symptoms or myocardial infarction 1.  Okay to proceed for upper endoscopy and/or colonoscopy for further evaluation of abdominal discomfort 2.  Okay to hold Eliquis for 2days for further risk reduction of bleeding complications with procedure 3.  Continue metoprolol for further risk reduction and atrial fibrillation with good heart rate control 4.  No further cardiac diagnostics necessary at this time  Signed, Serafina Royals M.D. FACC

## 2018-04-29 NOTE — Progress Notes (Signed)
Danielle [name] at [facility] NAME: Danielle [name]    MR#:  [id number]  DATE OF BIRTH:  06-17-1956  SUBJECTIVE:  CHIEF COMPLAINT:   Chief Complaint  Patient presents with  . Shortness of Breath   The patient still has RUQ abdominal pain pain, 8/10, intermittent.  REVIEW OF SYSTEMS:  Review of Systems  Constitutional: Positive for malaise/fatigue. Negative for chills and fever.  HENT: Negative for sore throat.   Eyes: Negative for blurred vision and double vision.  Respiratory: Negative for cough, hemoptysis, shortness of breath, wheezing and stridor.   Cardiovascular: Negative for chest pain, palpitations, orthopnea and leg swelling.  Gastrointestinal: Positive for abdominal pain. Negative for blood in stool, constipation, diarrhea, melena, nausea and vomiting.  Genitourinary: Negative for dysuria, flank pain and hematuria.  Musculoskeletal: Negative for back pain and joint pain.  Skin: Negative for rash.  Neurological: Negative for dizziness, sensory change, focal weakness, seizures, loss of consciousness, weakness and headaches.  Endo/Heme/Allergies: Negative for polydipsia.  Psychiatric/Behavioral: Negative for depression. The patient is not nervous/anxious.     DRUG ALLERGIES:  No Known Allergies VITALS:  Blood pressure 135/86, pulse 72, temperature (!) 97.5 F (36.4 C), temperature source Oral, resp. rate 18, height 5\' 6"  (1.676 m), weight 146 lb 3.2 oz (66.3 kg), SpO2 100 %. PHYSICAL EXAMINATION:  Physical Exam  Constitutional: She is oriented to person, place, and time. She appears well-developed.  HENT:  Head: Normocephalic.  Mouth/Throat: Oropharynx is clear and moist.  Eyes: Pupils are equal, round, and reactive to light. Conjunctivae and EOM are normal. No scleral icterus.  Neck: Normal range of motion. Neck supple. No JVD present. No tracheal deviation present.  Cardiovascular: Normal rate and normal heart sounds. Exam reveals  no gallop.  No murmur heard. Pulmonary/Chest: Effort normal and breath sounds normal. No respiratory distress. She has no wheezes. She has no rales.  Abdominal: Soft. Bowel sounds are normal. She exhibits no distension. There is tenderness. There is no rebound.  Tenderness on RUQ  Musculoskeletal: Normal range of motion. She exhibits no edema or tenderness.  Neurological: She is alert and oriented to person, place, and time. No cranial nerve deficit.  Skin: No rash noted. No erythema.  Psychiatric: She has a normal mood and affect.   LABORATORY PANEL:  Female CBC Recent Labs  Lab 04/29/18 0605  WBC 5.0  HGB 8.4*  HCT 24.6*  PLT 267   ------------------------------------------------------------------------------------------------------------------ Chemistries  Recent Labs  Lab 04/23/18 0600  04/26/18 0546  04/28/18 0610  NA 133*   < > 133*  --  131*  K 3.3*   < > 5.8*   < > 4.7  CL 102   < > 104  --  99  CO2 20*   < > 23  --  23  GLUCOSE 146*   < > 127*  --  97  BUN 31*   < > 43*  --  28*  CREATININE 1.54*   < > 1.33*  --  0.98  CALCIUM 6.8*   < > 8.6*  --  8.8*  MG 2.2   < > 2.1  --   --   AST 107*  --   --   --   --   ALT 78*  --   --   --   --   ALKPHOS 85  --   --   --   --   BILITOT 2.2*  --   --   --   --    < > =  values in this interval not displayed.   RADIOLOGY:  Dg Abd Portable 1v  Result Date: 04/28/2018 CLINICAL DATA:  62 y.o. female with history of COPD, lung cancer status post SBRT, admitted with worsening shortness of breath and cough. GI consulted for CT abdomen revealing ascending colon thickening.Pt. Reports right upper quadrant, right mid quadrant abdominal pain. Cramping, intermittent, 5/10, nonradiating, associated with nausea. No vomiting.Reports having a bowel movement on this admission, and passing gas. No obstructive symptoms. Poor appetite. EXAM: PORTABLE ABDOMEN - 1 VIEW COMPARISON:  CT, 04/27/2018. FINDINGS: There is no bowel dilation to suggest  obstruction or significant adynamic ileus. There are scattered aortic and iliac artery vascular calcifications. Soft tissues are otherwise unremarkable. Residual contrast is noted in the colon. No acute skeletal abnormality IMPRESSION: 1. No acute findings.  No evidence of bowel obstruction. Electronically Signed   By: Lajean Manes M.D.   On: 04/28/2018 15:26   ASSESSMENT AND PLAN:   25F acute hypoxemic respiratory failure, acute COPD exacerbation, dehydration, hypokalemia, hypoglycemia, renal failure, hypocalcemia, hypoalbuminemia, transaminasemia/hyperbilirubinemia, BNP elevation, Troponin-I elevation, macrocytic anemia.  Acute on chronic respiratory failure with hypoxia and hypercapnia due to COPD exacerbation, radiation pneumonitis/fibrosis and CHF. The patient is off BiPAP, on oxygen by nasal cannula 2L (home O2). Continue nebulizer and DuoNeb as needed, completed Zithromax, discontinued IV Solu-Medrol, changed to prednisone and completed.  New onset A. fib with RVR. The patient was treated with Cardizem IV without improvement. Cardizem drip was started. Consider anticoagulation for stroke prevention per Dr. Ubaldo Glassing. Echocardiogram showed ejection fraction 35 to 40%. Changed to p.o. Cardizem and started Eliquis per Dr. Ubaldo Glassing. Cardizem was discontinued and Lopressor was started by Dr.Fath.  Change to Toprol XL 150 mg po daily. Hold Eliquis and started heparin drip for colonoscopy tomorrow. Hold-drip later today.   Elevated troponin due to demanding ischemia.  Chronic systolic CHF LV EF: 49% - 40%. Stable. Lasix as needed.  Hypokalemia. She was given potassium supplement and improved. Hyperkalemia. Hold potassium supplement. She was gIven NovoLog, D50,  calcium gluconate IV and Lokalema, but the potassium was 5.4.   Given D50, NovoLog IV, and Lokelma.    Potassium decreased to 4.7.  Acute renal failure due to ATN and dehydration. Improved with normal saline  IV.  Hypomagnesemia and hypophosphatemia. Improved with supplement.  Moderate Malnutritionrelated to chronic illness. Follow dietitian's recommendation.  Abdominal pain, possible due to colon cancer. The patient was initially suspected due to constipation.  She still has abdominal pain after large bowel movement. Abdominal CAT scan: Focal area of irregular wall thickening and relative narrowing of the right colon at and just below the hepatic flexure extending over approximately 7-8 cm.  Pain control. Per Dr. [name], the patient can get colonoscopy, may stop Eliquis 1 to 2 days prior to endoscopy to lower bleeding potential and complication risk and restart as soon as possible thereafter due to patient maintaining normal sinus rhythm. Per Dr. Bonna Gains, colonoscopy tomorrow.  Liver function test is unremarkable. Abdominal ultrasound shows severe hepatic steatosis.  Hyponatremia.  stable.  Stage I x2 lung cancer status post radiation;remote history of breast cancer.Follow-up of Dr. Rogue Bussing as outpatient.  Hypoglycemia.  Possible due to poor intake.  The patient was given D50, started D5 normal saline IV. Decreased to sensitive sliding scale.  Improved.  The patient need home health and PT.  All the records are reviewed and case discussed with Care Management/Social Worker. Management plans discussed with the patient, her daughter and they are in agreement.  CODE STATUS: Full Code  TOTAL TIME TAKING CARE OF THIS PATIENT: 32 minutes.   More than 50% of the time was spent in counseling/coordination of care: YES  POSSIBLE D/C IN 1 DAYS, DEPENDING ON CLINICAL CONDITION.   Demetrios Loll M.D on 04/29/2018 at 2:37 PM  Between 7am to 6pm - Pager - [phone]  After 6pm go to [email or web address] - [facility]

## 2018-04-29 NOTE — Progress Notes (Signed)
[place] with OR to have advance directive completed. Danielle [name] is known by me from an initial consult for spiritual care last week. Pastoral presence ensued, (AD) education commenced, daughter has been identified as the healthcare POA, but she isn't available till tomorrow am, patient was asked to contact the daughter and arrange for her to be here in the am, so the (AD) can be completed. [name] seemed didn't have any further questions and or concerns, and was headed to have an MRI completed

## 2018-04-29 NOTE — Progress Notes (Signed)
Patient ID: Danielle [name], female   DOB: Sep 03, 1956, 62 y.o.   MRN: [id number]  This NP visited patient at the bedside as a follow up to for palliative medicine needs and emotional support.  Patient is alert and oriented and engages more freely in conversation with me this morning.  We spoke to her prolonged hospital stay, today is day 8.  She continues to verbalize intermittent abdominal pain.  She anticipates a colonoscopy today, "I hope they find out what is going on"  Patient tells me that her daughter is aware of her current medical situation.  Patient's goal is to return home.  " My children will help when I get there"     Discussed with the patient the importance of documentation of healthcare power of attorney and advanced directives.  Patient is hopeful to secure a healthcare power of attorney why she is here in the hospital, I will consult spiritual care to help.  She tells me she wants her daughter/[name] to be her  H POA  Discussed with her the importance of continued conversation with her family and her medical providers regarding overall plan of care and treatment options,  ensuring decisions are within the context of her values and GOCs.  Questions and concerns addressed   Total time spent on the unit was 35 minutes  Greater than 50% of the time was spent in counseling and coordination of care  Wadie Lessen NP  Palliative Medicine Team Team Phone # [phone] Pager [phone]

## 2018-04-29 NOTE — Progress Notes (Signed)
ANTICOAGULATION CONSULT NOTE   Pharmacy Consult for heparin Indication: atrial fibrillation  No Known Allergies  Patient Measurements: Height: 5\' 6"  (167.6 cm) Weight: 146 lb 3.2 oz (66.3 kg) IBW/kg (Calculated) : 59.3 Heparin Dosing Weight: 66.3 kg   Vital Signs: Temp: 97.4 F (36.3 C) (07/16 0428) Temp Source: Oral (07/16 0428) BP: 127/83 (07/16 0428) Pulse Rate: 64 (07/16 0428)  Labs: Recent Labs    04/28/18 0610 04/28/18 1508 04/28/18 2216 04/29/18 0605  HGB  --  8.5*  --  8.4*  HCT  --  25.3*  --  24.6*  PLT  --  282  --  267  APTT  --  37* 147* 137*  LABPROT  --  16.0*  --   --   INR  --  1.29  --   --   HEPARINUNFRC  --  3.00* 2.20*  --   CREATININE 0.98  --   --   --     Estimated Creatinine Clearance: 54.3 mL/min (by C-G formula based on SCr of 0.98 mg/dL).   Medical History: Past Medical History:  Diagnosis Date  . Anxiety   . Arthritis   . Cancer ([place]) left    breast cancer 2000, chemo tx's with total mastectomy and lymph nodes resected.   . Cancer of right lung ([place]) 02/21/2016   rad tx's.   [name] COPD (chronic obstructive pulmonary disease) ([place])   . Dependence on supplemental oxygen   . Depression   . Heart murmur   . Hypertension   . Lung nodule   . Lymphedema   . Shortness of breath dyspnea    with exertion  . Status post chemotherapy 2001   left breast cancer  . Status post radiation therapy 2001   left breast cancer    Medications:  Patient was started on apixaban 5mg  BID while inpatient for new onset afib. Last dose of apixaban 5mg  was 7/14 2049.   Assessment: 62 yo female admitted with SOB, cough, and abdominal pain found to have thickening of ascending colon. Patient diagnosed with afib and started on apixaban and cardizem. GI consult obtained and pending cardiology clear, patient will undergo colonoscopy. Apixaban now on hold pending colonoscopy. Pharmacy consulted to dose and monitor heparin.   Goal of Therapy:  Heparin level  0.3-0.7 units/ml Monitor platelets by anticoagulation protocol: Yes   Plan:  07/15 @ 2200 aPTT 147 sec supratherapeutic. Will decrease rate to 800 units/hr and will recheck aPTT/HL @ 0600. CBC check in the morning.  07/16 @ 0600 aPTT 137 supratherapeutic. Will decrease rate to 700 units/hr and will recheck anti-Xa @ 1200. CBC stable will continue to monitor. Awaiting HL to determine correlation  Tobie Lords, PharmD, BCPS Clinical Pharmacist 04/29/2018

## 2018-04-29 NOTE — Plan of Care (Signed)
  Problem: Activity: Goal: Risk for activity intolerance will decrease Outcome: Progressing   Problem: Coping: Goal: Level of anxiety will decrease Outcome: Progressing   Problem: Elimination: Goal: Will not experience complications related to urinary retention Outcome: Progressing   Problem: Pain Managment: Goal: General experience of comfort will improve Outcome: Progressing   Problem: Safety: Goal: Ability to remain free from injury will improve Outcome: Progressing   Problem: Skin Integrity: Goal: Risk for impaired skin integrity will decrease Outcome: Progressing

## 2018-04-29 NOTE — Progress Notes (Signed)
ANTICOAGULATION CONSULT NOTE   Pharmacy Consult for heparin Indication: atrial fibrillation  No Known Allergies  Patient Measurements: Height: 5\' 6"  (167.6 cm) Weight: 146 lb 3.2 oz (66.3 kg) IBW/kg (Calculated) : 59.3 Heparin Dosing Weight: 66.3 kg   Vital Signs: Temp: 97.8 F (36.6 C) (07/16 1941) Temp Source: Oral (07/16 1941) BP: 118/86 (07/16 1941) Pulse Rate: 72 (07/16 1941)  Labs: Recent Labs    04/28/18 0610 04/28/18 1508  04/29/18 0605 04/29/18 1204 04/29/18 1932  HGB  --  8.5*  --  8.4*  --   --   HCT  --  25.3*  --  24.6*  --   --   PLT  --  282  --  267  --   --   APTT  --  37*   < > 137* 128* 96*  LABPROT  --  16.0*  --   --   --   --   INR  --  1.29  --   --   --   --   HEPARINUNFRC  --  3.00*   < > 2.28* 1.66* 1.31*  CREATININE 0.98  --   --   --   --   --    < > = values in this interval not displayed.    Estimated Creatinine Clearance: 54.3 mL/min (by C-G formula based on SCr of 0.98 mg/dL).   Medical History: Past Medical History:  Diagnosis Date  . Anxiety   . Arthritis   . Cancer ([place]) left    breast cancer 2000, chemo tx's with total mastectomy and lymph nodes resected.   . Cancer of right lung ([place]) 02/21/2016   rad tx's.   [name] COPD (chronic obstructive pulmonary disease) ([place])   . Dependence on supplemental oxygen   . Depression   . Heart murmur   . Hypertension   . Lung nodule   . Lymphedema   . Shortness of breath dyspnea    with exertion  . Status post chemotherapy 2001   left breast cancer  . Status post radiation therapy 2001   left breast cancer    Medications:  Patient was started on apixaban 5mg  BID while inpatient for new onset afib. Last dose of apixaban 5mg  was 7/14 2049.   Assessment: 62 yo female admitted with SOB, cough, and abdominal pain found to have thickening of ascending colon. Patient diagnosed with afib and started on apixaban and cardizem. GI consult obtained and pending cardiology clear, patient will  undergo colonoscopy. Apixaban now on hold pending colonoscopy. Pharmacy consulted to dose and monitor heparin.   Goal of Therapy:  Heparin level 0.3-0.7 units/ml Monitor platelets by anticoagulation protocol: Yes   Plan:  07/15 @ 2200 aPTT 147 sec supratherapeutic. Will decrease rate to 800 units/hr and will recheck aPTT/HL @ 0600. CBC check in the morning.  07/16 @ 0600 aPTT 137 supratherapeutic. Will decrease rate to 700 units/hr and will recheck anti-Xa @ 1200. CBC stable will continue to monitor. Awaiting HL to determine correlation  07/16 @1200  APTT 128 supratherapeutic with goal APTT of 66-102. Will decrease rate to 600 units/hr and recheck APTT/HL in 6 hours. CBC stable. Pharmacy will continue to monitor.   07/16 19:30 aPTT 96 is within range, Heparin level 1.21 still does not correlate. Will continue with current rate of 600 units/hr and recheck aPTT in 6 hours.  Paulina Fusi, PharmD, BCPS 04/29/2018 8:08 PM

## 2018-04-29 NOTE — Progress Notes (Signed)
Nutrition Follow Up Note    DOCUMENTATION CODES:   Non-severe (moderate) malnutrition in context of chronic illness  INTERVENTION:   Boost Breeze po TID, each supplement provides 250 kcal and 9 grams of protein.  Provide daily MVI, thiamine, and folic acid in the setting of etoh abuse  NUTRITION DIAGNOSIS:   Moderate Malnutrition related to chronic illness(hx breast cancer, CHF, COPD) as evidenced by mild fat depletion, moderate fat depletion, mild muscle depletion, moderate muscle depletion.  GOAL:   Patient will meet greater than or equal to 90% of their needs  -not met  MONITOR:   PO intake, Supplement acceptance, Labs, Weight trends, I & O's  ASSESSMENT:   62 year old female with PMHx of arthritis, HTN, COPD, anxiety, depression, CHF, breast cancer s/p left mastectomy and chemotherapy/XRT, lymphedema who is now admitted with severe hypoxic and hypercapnic respiratory failure initially requiring BiPAP, CHF exacerbation, radiation pneumonitis/fibrosis.   Pt continues to do poorly; eating <25% of meals since admit and not compliant with supplements. Pt reports abdominal pain today. Pt on clear liquids today and NPO after midnight for colonoscopy tomorrow. Pt has not eaten enough to meet her estimated needs since admit. Pending colonoscopy results tomorrow, pt will need to be evaluated for possible nutrition support. Note in chart stating pt with heavy etoh use at home. Will initiate folic acid and thiamine. Per chart, pt with weight gain since admit; pt is noted to have edema. Will determine nutrition plan pending colonoscopy tomorrow.   Medications reviewed and include: celexa, insulin, Mg oxide, MVI, heparin  Labs reviewed: Na 131(L), K 4.7 wnl, BUN 28(H) P 3.2 wnl, Mg 2.1 wnl- 7/13 Folate- 9.4 wnl - 7/8 B12- 2668(H)- 7/8 Wbc- Hgb 8.4(L), Hct 24.6(L), MVC 102.4(H), MCH 35.1(H) cbgs- 120, 119 x 24 hrs  Diet Order:   Diet Order           Diet clear liquid Room service  appropriate? Yes; Fluid consistency: Thin  Diet effective now        Diet - low sodium heart healthy        Diet - low sodium heart healthy        Diet - low sodium heart healthy          EDUCATION NEEDS:   Education needs have been addressed  Skin:  Skin Assessment: Reviewed RN Assessment  Last BM:  7/15- TYPE 5  Height:   Ht Readings from Last 1 Encounters:  04/22/18 '5\' 6"'$  (1.676 m)    Weight:   Wt Readings from Last 1 Encounters:  04/26/18 146 lb 3.2 oz (66.3 kg)    Ideal Body Weight:  59.1 kg  BMI:  Body mass index is 23.6 kg/m.  Estimated Nutritional Needs:   Kcal:  1505-1805 (25-30 kcal/kg)  Protein:  70-85 grams (1.2-1.4 grams/kg)  Fluid:  1.5-1.8 L/day (1 mL/kcal)  [name] MS, RD, LDN Pager #- [phone] Office#- [phone] After Hours Pager: [phone]

## 2018-04-30 ENCOUNTER — Inpatient Hospital Stay: Payer: Medicaid Other | Admitting: Certified Registered Nurse Anesthetist

## 2018-04-30 ENCOUNTER — Encounter
Admission: EM | Disposition: A | Discharge status: Home / Self Care | Payer: Self-pay | Source: Home / Self Care | Attending: Internal Medicine

## 2018-04-30 DIAGNOSIS — R933 Abnormal findings on diagnostic imaging of other parts of digestive tract: Secondary | ICD-10-CM

## 2018-04-30 DIAGNOSIS — R1011 Right upper quadrant pain: Secondary | ICD-10-CM

## 2018-04-30 DIAGNOSIS — K5730 Diverticulosis of large intestine without perforation or abscess without bleeding: Secondary | ICD-10-CM

## 2018-04-30 DIAGNOSIS — K633 Ulcer of intestine: Secondary | ICD-10-CM

## 2018-04-30 HISTORY — PX: COLONOSCOPY: SHX5424

## 2018-04-30 LAB — BASIC METABOLIC PANEL
ANION GAP: 7 (ref 5–15)
BUN: 22 mg/dL (ref 8–23)
CO2: 24 mmol/L (ref 22–32)
Calcium: 8 mg/dL — ABNORMAL LOW (ref 8.9–10.3)
Chloride: 97 mmol/L — ABNORMAL LOW (ref 98–111)
Creatinine, Ser: 0.99 mg/dL (ref 0.44–1.00)
GFR calc Af Amer: >60 mL/min (ref >60)
GFR, EST NON AFRICAN AMERICAN: 59 mL/min — AB (ref >60)
GLUCOSE: 77 mg/dL (ref 70–99)
POTASSIUM: 4.6 mmol/L (ref 3.5–5.1)
SODIUM: 128 mmol/L — AB (ref 135–145)

## 2018-04-30 LAB — GLUCOSE, CAPILLARY
GLUCOSE-CAPILLARY: 102 mg/dL — AB (ref 70–99)
GLUCOSE-CAPILLARY: 82 mg/dL (ref 70–99)
Glucose-Capillary: 71 mg/dL (ref 70–99)
Glucose-Capillary: 95 mg/dL (ref 70–99)
Glucose-Capillary: 76 mg/dL (ref 70–99)
Glucose-Capillary: 72 mg/dL (ref 70–99)

## 2018-04-30 LAB — APTT
APTT: 98 s — AB (ref 24–36)
APTT: 63 s — AB (ref 24–36)

## 2018-04-30 SURGERY — COLONOSCOPY
Anesthesia: General

## 2018-04-30 MED ORDER — PROPOFOL 500 MG/50ML IV EMUL
INTRAVENOUS | Status: AC
  Filled 2018-04-30: qty 50

## 2018-04-30 MED ORDER — PROPOFOL 10 MG/ML IV BOLUS
INTRAVENOUS | Status: DC | PRN
  Administered 2018-04-30: 30 mg via INTRAVENOUS

## 2018-04-30 MED ORDER — MIDAZOLAM HCL 2 MG/2ML IJ SOLN
INTRAMUSCULAR | Status: DC | PRN
  Administered 2018-04-30: 2 mg via INTRAVENOUS

## 2018-04-30 MED ORDER — MIDAZOLAM HCL 2 MG/2ML IJ SOLN
INTRAMUSCULAR | Status: AC
  Filled 2018-04-30: qty 2

## 2018-04-30 MED ORDER — HEPARIN (PORCINE) IN NACL 100-0.45 UNIT/ML-% IJ SOLN
850.0000 [IU]/h | INTRAMUSCULAR | Status: DC
  Administered 2018-04-30: 600 [IU]/h via INTRAVENOUS
  Filled 2018-04-30: qty 250

## 2018-04-30 MED ORDER — PROPOFOL 500 MG/50ML IV EMUL
INTRAVENOUS | Status: DC | PRN
  Administered 2018-04-30: 75 ug/kg/min via INTRAVENOUS

## 2018-04-30 MED ORDER — DEXTROSE-NACL 5-0.9 % IV SOLN
INTRAVENOUS | Status: DC
  Administered 2018-04-30 – 2018-05-01 (×3): via INTRAVENOUS

## 2018-04-30 MED ORDER — SODIUM CHLORIDE 0.9 % IV SOLN
INTRAVENOUS | Status: DC | PRN
  Administered 2018-04-30: 12:00:00 via INTRAVENOUS

## 2018-04-30 MED ORDER — HYDROMORPHONE HCL 1 MG/ML IJ SOLN
0.5000 mg | Freq: Once | INTRAMUSCULAR | Status: AC
  Administered 2018-04-30: 0.5 mg via INTRAVENOUS
  Filled 2018-04-30: qty 1

## 2018-04-30 MED ORDER — DEXTROSE 50 % IV SOLN
25.0000 mL | Freq: Once | INTRAVENOUS | Status: AC
  Administered 2018-04-30: 25 mL via INTRAVENOUS
  Filled 2018-04-30: qty 50

## 2018-04-30 NOTE — Progress Notes (Addendum)
Pt complaining of 7 put of 10 pain. Page prime. Will continue to monitor.  Update 2200: Doctor maier ordered to give Dilaudid 0.5 mg IV once. Will continue to monitor.

## 2018-04-30 NOTE — Progress Notes (Addendum)
[name], MD [address], [place], [place], [place], [address] [address], [place], [place], [place], [address] Phone: [phone]  Fax: [phone]   Subjective:  Patient continues to complain of right upper quadrant abdominal pain.  Tolerated clear liquid diet and colonoscopy prep yesterday.  Drank three quarters of it.  No nausea or vomiting.  No signs of GI bleed.  Objective: Exam: Vital signs in last 24 hours: Vitals:   04/29/18 2121 04/30/18 0459 04/30/18 0744 04/30/18 0825  BP: 118/88 (!) 133/92 (!) 151/83   Pulse: 75 69 69   Resp:  16    Temp:  97.8 F (36.6 C) 98.4 F (36.9 C)   TempSrc:  Oral Oral   SpO2:  100% 100% 100%  Weight:      Height:       Weight change:   Intake/Output Summary (Last 24 hours) at 04/30/2018 1018 Last data filed at 04/30/2018 0400 Gross per 24 hour  Intake 240 ml  Output 2300 ml  Net -2060 ml    General: No acute distress, AAO x3 Abd: Soft,  mildly tender to palpation right upper quadrant, ND, No HSM Skin: Warm, no rashes Neck: Supple, Trachea midline   Lab Results: Lab Results  Component Value Date   WBC 5.0 04/29/2018   HGB 8.4 (L) 04/29/2018   HCT 24.6 (L) 04/29/2018   MCV 102.4 (H) 04/29/2018   PLT 267 04/29/2018   Micro Results: Recent Results (from the past 240 hour(s))  MRSA PCR Screening     Status: None   Collection Time: 04/21/18  6:53 AM  Result Value Ref Range Status   MRSA by PCR NEGATIVE NEGATIVE Final    Comment:        The GeneXpert MRSA Assay (FDA approved for NASAL specimens only), is one component of a comprehensive MRSA colonization surveillance program. It is not intended to diagnose MRSA infection nor to guide or monitor treatment for MRSA infections. Performed at [facility], [address] [place], [place] [address]   C difficile quick scan w PCR reflex     Status: None   Collection Time: 04/27/18  2:36 PM  Result Value Ref Range Status   C Diff antigen  NEGATIVE NEGATIVE Final   C Diff toxin NEGATIVE NEGATIVE Final   C Diff interpretation No C. difficile detected.  Final    Comment: Performed at [facility], [address] [place], [place] [address]   Studies/Results: Dg Abd Portable 1v  Result Date: 04/28/2018 CLINICAL DATA:  62 y.o. female with history of COPD, lung cancer status post SBRT, admitted with worsening shortness of breath and cough. GI consulted for CT abdomen revealing ascending colon thickening.Pt. Reports right upper quadrant, right mid quadrant abdominal pain. Cramping, intermittent, 5/10, nonradiating, associated with nausea. No vomiting.Reports having a bowel movement on this admission, and passing gas. No obstructive symptoms. Poor appetite. EXAM: PORTABLE ABDOMEN - 1 VIEW COMPARISON:  CT, 04/27/2018. FINDINGS: There is no bowel dilation to suggest obstruction or significant adynamic ileus. There are scattered aortic and iliac artery vascular calcifications. Soft tissues are otherwise unremarkable. Residual contrast is noted in the colon. No acute skeletal abnormality IMPRESSION: 1. No acute findings.  No evidence of bowel obstruction. Electronically Signed   By: Lajean Manes M.D.   On: 04/28/2018 15:26   Medications:  Scheduled Meds: . citalopram  20 mg Oral Daily  . feeding supplement  1 Container Oral TID BM  . folic acid  1  mg Oral Daily  . insulin aspart  0-9 Units Subcutaneous TID WC  . ipratropium-albuterol  3 mL Nebulization TID  . magnesium oxide  400 mg Oral Daily  . metoprolol tartrate  75 mg Oral BID  . mometasone-formoterol  2 puff Inhalation BID  . multivitamin with minerals  1 tablet Oral Daily  . sodium phosphate  1 enema Rectal Once  . thiamine  100 mg Oral Daily   Continuous Infusions: . dextrose 5 % and 0.9% NaCl 50 mL/hr at 04/30/18 0800   PRN Meds:.acetaminophen **OR** acetaminophen, bisacodyl, bisacodyl, dextrose, HYDROcodone-acetaminophen, ipratropium-albuterol, morphine injection,  ondansetron **OR** ondansetron (ZOFRAN) IV, senna-docusate   Assessment: Active Problems:   Malignant neoplasm of upper lobe of right lung (HCC)   Acute hypoxemic respiratory failure (HCC)   Malnutrition of moderate degree   Palliative care by specialist   DNR (do not resuscitate) discussion   Weakness generalized   COPD exacerbation ([place])   Generalized abdominal pain   Colonic thickening    Plan: Eliquis has been on hold for 2 days Proceed with colonoscopy today to evaluate ascending colon thickening Possible biopsies of the area Heparin drip discontinued this morning Cardiology has cleared patient for the procedure  I have discussed alternative options, risks & benefits,  which include, but are not limited to, bleeding, infection, perforation,respiratory complication & drug reaction.  The patient agrees with this plan & written consent will be obtained.      LOS: 9 days   [name], MD 04/30/2018, 10:18 AM

## 2018-04-30 NOTE — Plan of Care (Signed)
?  Problem: Clinical Measurements: ?Goal: Will remain free from infection ?Outcome: Progressing ?  ?

## 2018-04-30 NOTE — Progress Notes (Signed)
[facility] Cardiology [facility] Encounter Note  Patient: Danielle [name] / Admit Date: 04/21/2018 / Date of Encounter: 04/30/2018, 8:19 AM   Subjective: Renewed concerns of abdominal discomfort waxing and waning but no evidence of true cardiac symptoms today.  Patient has telemetry showing normal sinus rhythm maintaining normal rhythm with metoprolol.  Eliquis being held at this time for upper endoscopy and colonoscopy.  No evidence of significant cardiac symptoms today  Review of Systems: Positive for: Abdominal discomfort Negative for: Vision change, hearing change, syncope, dizziness, nausea, vomiting,diarrhea, bloody stool,  cough, congestion, diaphoresis, urinary frequency, urinary pain,skin lesions, skin rashes Others previously listed  Objective: Telemetry: Normal sinus rhythm Physical Exam: Blood pressure (!) 151/83, pulse 69, temperature 98.4 F (36.9 C), temperature source Oral, resp. rate 16, height 5\' 6"  (1.676 m), weight 146 lb 3.2 oz (66.3 kg), SpO2 100 %. Body mass index is 23.6 kg/m. General: Well developed, well nourished, in no acute distress. Head: Normocephalic, atraumatic, sclera non-icteric, no xanthomas, nares are without discharge. Neck: No apparent masses Lungs: Normal respirations with no wheezes, no rhonchi, no rales , no crackles   Heart: Regular rate and rhythm, normal S1 S2, no murmur, no rub, no gallop, PMI is normal size and placement, carotid upstroke normal without bruit, jugular venous pressure normal Abdomen: Soft, non-tender, non-distended with normoactive bowel sounds. No hepatosplenomegaly. Abdominal aorta is normal size without bruit Extremities: No edema, no clubbing, no cyanosis, no ulcers,  Peripheral: 2+ radial, 2+ femoral, 2+ dorsal pedal pulses Neuro: Alert and oriented. Moves all extremities spontaneously. Psych:  Responds to questions appropriately with a normal affect.   Intake/Output Summary (Last 24 hours) at 04/30/2018 0819 Last data  filed at 04/30/2018 0400 Gross per 24 hour  Intake 240 ml  Output 2300 ml  Net -2060 ml    Inpatient Medications:  . citalopram  20 mg Oral Daily  . feeding supplement  1 Container Oral TID BM  . folic acid  1 mg Oral Daily  . insulin aspart  0-9 Units Subcutaneous TID WC  . ipratropium-albuterol  3 mL Nebulization TID  . magnesium oxide  400 mg Oral Daily  . metoprolol tartrate  75 mg Oral BID  . mometasone-formoterol  2 puff Inhalation BID  . multivitamin with minerals  1 tablet Oral Daily  . sodium phosphate  1 enema Rectal Once  . thiamine  100 mg Oral Daily   Infusions:  . dextrose 5 % and 0.9% NaCl 50 mL/hr at 04/30/18 0800    Labs: Recent Labs    04/28/18 0610 04/30/18 0137  NA 131* 128*  K 4.7 4.6  CL 99 97*  CO2 23 24  GLUCOSE 97 77  BUN 28* 22  CREATININE 0.98 0.99  CALCIUM 8.8* 8.0*   No results for input(s): AST, ALT, ALKPHOS, BILITOT, PROT, ALBUMIN in the last 72 hours. Recent Labs    04/28/18 1508 04/29/18 0605  WBC 5.3 5.0  HGB 8.5* 8.4*  HCT 25.3* 24.6*  MCV 101.9* 102.4*  PLT 282 267   No results for input(s): CKTOTAL, CKMB, TROPONINI in the last 72 hours. Invalid input(s): POCBNP No results for input(s): HGBA1C in the last 72 hours.   Weights: Filed Weights   04/22/18 1317 04/25/18 0357 04/26/18 0259  Weight: 141 lb 3.2 oz (64 kg) 147 lb 3.2 oz (66.8 kg) 146 lb 3.2 oz (66.3 kg)     Radiology/Studies:  Ct Abdomen Pelvis Wo Contrast  Result Date: 04/27/2018 CLINICAL DATA:  14F acute hypoxemic  respiratory failure, acute COPD exacerbation, dehydration, hypokalemia, hypoglycemia, renal failure, hypocalcemia, hypoalbuminemia, transaminasemia/hyperbilirubinemia, BNP elevation, Troponin-I elevation, macrocytic anemiaHx of lung and breast cancer EXAM: CT ABDOMEN AND PELVIS WITHOUT CONTRAST TECHNIQUE: Multidetector CT imaging of the abdomen and pelvis was performed following the standard protocol without IV contrast. COMPARISON:  PET-CT,  07/13/2016 FINDINGS: Lower chest: Small right pleural effusion. Minor dependent right lower lobe atelectasis. No evidence of pneumonia or pulmonary edema. Hepatobiliary: Markedly decreased attenuation of the liver consistent with extensive fatty infiltration. No liver mass or focal lesion. Gallbladder is contracted but otherwise unremarkable. No bile duct dilation. Pancreas: Unremarkable. No pancreatic ductal dilatation or surrounding inflammatory changes. Spleen: Small spleen.  No splenic mass or focal lesion. Adrenals/Urinary Tract: Adrenal glands are unremarkable. Kidneys are normal, without renal calculi, focal lesion, or hydronephrosis. Bladder is unremarkable. Stomach/Bowel: There is irregular wall thickening with associated narrowing of the ascending colon at and just inferior to the hepatic flexure, extending for 7-8 cm in length. Small area of inflammatory type stranding is seen along the lateral margin of this portion of the colon abutting the inferior margin of the liver. No other colonic wall thickening or evidence of inflammation. Stomach and small bowel are unremarkable. Cecum is distended to 6.6 cm. Normal appendix is visualized. Vascular/Lymphatic: Extensive aortic atherosclerotic calcifications extending into the branch vessels. No aneurysm. No adenopathy. Reproductive: Uterus and bilateral adnexa are unremarkable. Other: No abdominal wall hernia or abnormality. No abdominopelvic ascites. Musculoskeletal: Mild compression deformity of L5, of unclear chronicity, but knows likely chronic. There is no associated periatrial edema or hemorrhage. No other fractures. No osteoblastic or osteolytic lesions. IMPRESSION: 1. Focal area of irregular wall thickening and relative narrowing of the right colon at and just below the hepatic flexure extending over approximately 7-8 cm. This may be infectious or inflammatory or neoplastic in etiology. Recommend follow-up colonoscopy. No other evidence of an acute  abnormality. 2. Extensive hepatic steatosis. 3. Small right pleural effusion. 4. Extensive aortic atherosclerosis. 5. Mild compression fracture of L5 of unclear chronicity but most likely chronic. Electronically Signed   By: Lajean Manes M.D.   On: 04/27/2018 14:56   Ct Chest Wo Contrast  Result Date: 04/21/2018 CLINICAL DATA:  62 year old female with a history of abnormal chest x-ray and prior known right-sided lung carcinoma status post treatment EXAM: CT CHEST WITHOUT CONTRAST TECHNIQUE: Multidetector CT imaging of the chest was performed following the standard protocol without IV contrast. COMPARISON:  Chest x-ray today's date, prior CT 09/12/2017, 03/01/2017, PET-CT 07/13/2016 FINDINGS: Cardiovascular: Cardiac size unchanged. No pericardial fluid/thickening. Dense calcifications of left main, left anterior descending, circumflex, right coronary arteries. Calcifications of the aortic valve. Calcifications of the transverse arch and descending thoracic aorta. No aneurysm. Incidental calcifications of the suprarenal aorta of the upper abdomen. Diameter of the main pulmonary artery unremarkable. Mediastinum/Nodes: Small lymph nodes of the mediastinum, including paratracheal nodes unchanged in number from the comparison CT. Unchanged subcarinal lymph node. Lungs/Pleura: The abnormality on today's chest x-ray corresponds 2 consolidation/architectural distortion and confluent opacity of the right upper lobe which is more pronounced than the comparison CT of 09/12/2017. Volume loss with linear and nodular opacity of the right upper lobe, extends along the peribronchovascular Cher of right upper lobe bronchi to the pleura anteriorly and medially. Nodular changes are inseparable from the hilar structures with volume loss and contraction of the proximal airways of the right upper lobe. Bronchus intermedius and the middle lobe bronchi remain patent. Right lower lobe airways remain patent. No confluent  airspace disease.  Diameter of the nodularity on image 47 measures 3.5 cm x 2.9 cm, and more inferiorly measures 4.8 cm x 3.5 cm. All of these changes are in the treated area from the prior CT. Left lung demonstrates similar emphysematous changes and scarring at the apex. No new nodule/mass identified on the left. No confluent airspace disease. Small right greater than left pleural effusions, new from the prior. Upper Abdomen: Steatosis. Musculoskeletal: No acute displaced fracture. Degenerative changes of the spine. No new aggressive bony lesion identified. IMPRESSION: The abnormality of this chest x-ray corresponds to nodular/consolidative changes of the right upper lobe with architectural distortion in the area of prior therapy. 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 Correlation with the patient's prior treatment may be useful, as well as potential future imaging with contrast-enhanced CT chest or alternatively PET-CT. Small lymph nodes of the mediastinum, without increasing lymphadenopathy. Small right greater than left pleural effusion. Emphysema.  Emphysema (ICD10-J43.9). Aortic atherosclerosis with left main and 3 vessel coronary artery disease. Aortic Atherosclerosis (ICD10-I70.0). Hepatic steatosis. Electronically Signed   By: Corrie Mckusick D.O.   On: 04/21/2018 17:28   US Venous Img Lower Bilateral  Result Date: 04/21/2018 CLINICAL DATA:  Bilateral lower extremity pain and edema. History of fall 2 weeks ago. History of breast cancer. Evaluate for DVT. EXAM: BILATERAL LOWER EXTREMITY VENOUS DOPPLER ULTRASOUND TECHNIQUE: Gray-scale sonography with graded compression, as well as color Doppler and duplex ultrasound were performed to evaluate the lower extremity deep venous systems from the level of the common femoral vein and including the common femoral, femoral, profunda  femoral, popliteal and calf veins including the posterior tibial, peroneal and gastrocnemius veins when visible. The superficial great saphenous vein was also interrogated. Spectral Doppler was utilized to evaluate flow at rest and with distal augmentation maneuvers in the common femoral, femoral and popliteal veins. COMPARISON:  None. FINDINGS: RIGHT LOWER EXTREMITY Common Femoral Vein: No evidence of thrombus. Normal compressibility, respiratory phasicity and response to augmentation. Saphenofemoral Junction: No evidence of thrombus. Normal compressibility and flow on color Doppler imaging. Profunda Femoral Vein: No evidence of thrombus. Normal compressibility and flow on color Doppler imaging. Femoral Vein: No evidence of thrombus. Normal compressibility, respiratory phasicity and response to augmentation. Popliteal Vein: No evidence of thrombus. Normal compressibility, respiratory phasicity and response to augmentation. Calf Veins: No evidence of thrombus. Normal compressibility and flow on color Doppler imaging. Superficial Great Saphenous Vein: No evidence of thrombus. Normal compressibility. Venous Reflux:  None. Other Findings:  None. LEFT LOWER EXTREMITY Common Femoral Vein: No evidence of thrombus. Normal compressibility, respiratory phasicity and response to augmentation. Saphenofemoral Junction: No evidence of thrombus. Normal compressibility and flow on color Doppler imaging. Profunda Femoral Vein: No evidence of thrombus. Normal compressibility and flow on color Doppler imaging. Femoral Vein: No evidence of thrombus. Normal compressibility, respiratory phasicity and response to augmentation. Popliteal Vein: No evidence of thrombus. Normal compressibility, respiratory phasicity and response to augmentation. Calf Veins: No evidence of thrombus. Normal compressibility and flow on color Doppler imaging. Superficial Great Saphenous Vein: No evidence of thrombus. Normal compressibility. Venous Reflux:  None.  Other Findings:  None. IMPRESSION: No evidence of DVT within either lower extremity. Electronically Signed   By: Sandi Mariscal M.D.   On: 04/21/2018 14:13   Dg Chest Port 1 View  Result Date: 04/21/2018 CLINICAL DATA:  Initial evaluation for acute shortness of  breath, history of COPD. EXAM: PORTABLE CHEST 1 VIEW COMPARISON:  Prior radiograph from 04/19/2017 as well as prior CT from 09/12/2017. FINDINGS: Mild cardiomegaly, stable. Aortic atherosclerosis. Increased masslike density at the right hilum measuring approximately 3.7 x 3.5 cm. Adjacent right perihilar scarring and architectural distortion. No other focal infiltrates. No pleural effusion or pulmonary edema. Chronic bronchitic changes. No pneumothorax. No acute osseous abnormality. IMPRESSION: 1. Increased prominence of 3.5 cm mass like opacity at the right hilum, concerning for worsened malignancy. Further assessment with cross-sectional imaging of the chest suggested for further characterization. 2. Underlying chronic emphysematous changes. 3. Cardiomegaly with aortic atherosclerosis. Electronically Signed   By: Jeannine Boga M.D.   On: 04/21/2018 04:54   Dg Abd Portable 1v  Result Date: 04/28/2018 CLINICAL DATA:  62 y.o. female with history of COPD, lung cancer status post SBRT, admitted with worsening shortness of breath and cough. GI consulted for CT abdomen revealing ascending colon thickening.Pt. Reports right upper quadrant, right mid quadrant abdominal pain. Cramping, intermittent, 5/10, nonradiating, associated with nausea. No vomiting.Reports having a bowel movement on this admission, and passing gas. No obstructive symptoms. Poor appetite. EXAM: PORTABLE ABDOMEN - 1 VIEW COMPARISON:  CT, 04/27/2018. FINDINGS: There is no bowel dilation to suggest obstruction or significant adynamic ileus. There are scattered aortic and iliac artery vascular calcifications. Soft tissues are otherwise unremarkable. Residual contrast is noted in the  colon. No acute skeletal abnormality IMPRESSION: 1. No acute findings.  No evidence of bowel obstruction. Electronically Signed   By: Lajean Manes M.D.   On: 04/28/2018 15:26   US Abdomen Limited Ruq  Result Date: 04/23/2018 CLINICAL DATA:  Elevated bilirubin. EXAM: ULTRASOUND ABDOMEN LIMITED RIGHT UPPER QUADRANT COMPARISON:  PET-CT dated July 13, 2016. FINDINGS: Gallbladder: Contracted. No gallstones or wall thickening visualized. No sonographic Murphy sign noted by sonographer. Common bile duct: Diameter: 6-7 mm, at the upper limits of normal in size. Liver: No focal lesion identified. Diffusely increased in parenchymal echogenicity. 9 mm simple cyst in the right hepatic lobe. Portal vein is patent on color Doppler imaging with normal direction of blood flow towards the liver. IMPRESSION: 1. No acute abnormality. 2. Severe hepatic steatosis. Electronically Signed   By: Titus Dubin M.D.   On: 04/23/2018 16:34     Assessment and Recommendation  62 y.o. female with known essential hypertension mixed hyperlipidemia mild dilated cardiomyopathy with paroxysmal nonvalvular atrial fibrillation currently stable at this time in normal rhythm with no evidence of new cardiac symptoms or myocardial infarction 1.  Okay to proceed for upper endoscopy and/or colonoscopy for further evaluation of abdominal discomfort 2.  Okay to hold Eliquis for 2days for further risk reduction of bleeding complications with procedure 3.  Continue metoprolol for further risk reduction and atrial fibrillation with good heart rate control 4.  No further cardiac diagnostics necessary at this time 5.  Call if further questions  Signed, Serafina Royals M.D. FACC

## 2018-04-30 NOTE — Progress Notes (Signed)
ANTICOAGULATION CONSULT NOTE   Pharmacy Consult for heparin Indication: atrial fibrillation  No Known Allergies  Patient Measurements: Height: 5\' 6"  (167.6 cm) Weight: 146 lb 3.2 oz (66.3 kg) IBW/kg (Calculated) : 59.3 Heparin Dosing Weight: 66.3 kg   Vital Signs: Temp: 98.3 F (36.8 C) (07/17 1942) Temp Source: Oral (07/17 1942) BP: 154/98 (07/17 1942) Pulse Rate: 72 (07/17 2009)  Labs: Recent Labs    04/28/18 0610 04/28/18 1508  04/29/18 0605 04/29/18 1204 04/29/18 1932 04/30/18 0137 04/30/18 2200  HGB  --  8.5*  --  8.4*  --   --   --   --   HCT  --  25.3*  --  24.6*  --   --   --   --   PLT  --  282  --  267  --   --   --   --   APTT  --  37*   < > 137* 128* 96* 98* 63*  LABPROT  --  16.0*  --   --   --   --   --   --   INR  --  1.29  --   --   --   --   --   --   HEPARINUNFRC  --  3.00*   < > 2.28* 1.66* 1.31*  --   --   CREATININE 0.98  --   --   --   --   --  0.99  --    < > = values in this interval not displayed.    Estimated Creatinine Clearance: 53.7 mL/min (by C-G formula based on SCr of 0.99 mg/dL).   Medical History: Past Medical History:  Diagnosis Date  . Anxiety   . Arthritis   . Cancer ([place]) left    breast cancer 2000, chemo tx's with total mastectomy and lymph nodes resected.   . Cancer of right lung ([place]) 02/21/2016   rad tx's.   [name] COPD (chronic obstructive pulmonary disease) ([place])   . Dependence on supplemental oxygen   . Depression   . Heart murmur   . Hypertension   . Lung nodule   . Lymphedema   . Shortness of breath dyspnea    with exertion  . Status post chemotherapy 2001   left breast cancer  . Status post radiation therapy 2001   left breast cancer    Medications:  Patient was started on apixaban 5mg  BID while inpatient for new onset afib. Last dose of apixaban 5mg  was 7/14 2049.   Assessment: 62 yo female admitted with SOB, cough, and abdominal pain found to have thickening of ascending colon. Patient diagnosed with  afib and started on apixaban and cardizem. GI consult obtained and pending cardiology clear, patient will undergo colonoscopy. Apixaban now on hold pending colonoscopy. Pharmacy consulted to dose and monitor heparin.   Goal of Therapy:  Heparin level 0.3-0.7 units/ml Monitor platelets by anticoagulation protocol: Yes   Plan:  07/15 @ 2200 aPTT 147 sec supratherapeutic. Will decrease rate to 800 units/hr and will recheck aPTT/HL @ 0600. CBC check in the morning.  07/16 @ 0600 aPTT 137 supratherapeutic. Will decrease rate to 700 units/hr and will recheck anti-Xa @ 1200. CBC stable will continue to monitor. Awaiting HL to determine correlation  07/16 @1200  APTT 128 supratherapeutic with goal APTT of 66-102. Will decrease rate to 600 units/hr and recheck APTT/HL in 6 hours. CBC stable. Pharmacy will continue to monitor.   07/16 19:30 aPTT 96  is within range, Heparin level 1.21 still does not correlate. Will continue with current rate of 600 units/hr and recheck aPTT in 6 hours.  07/17 @ 0130 aPTT 98 therapeutic. Will continue current rate and will recheck aPTT/HL w/ am labs.  07/17 @ 2200 aPTT 63 subtherapeutic. Will increase rate to 700 units/hr and will recheck aPTT/HL w/ am labs. Will monitor CBC w/ am labs.  Tobie Lords, PharmD, BCPS Clinical Pharmacist 04/30/2018

## 2018-04-30 NOTE — Anesthesia Post-op Follow-up Note (Signed)
Anesthesia QCDR form completed.        

## 2018-04-30 NOTE — Progress Notes (Signed)
[name] NAME: Danielle [name]    MR#:  [id number]  DATE OF BIRTH:  [birth year]  SUBJECTIVE:  CHIEF COMPLAINT:   Chief Complaint  Patient presents with  . Shortness of Breath   The patient still has RUQ abdominal pain pain after colonoscopy. REVIEW OF SYSTEMS:  Review of Systems  Constitutional: Positive for malaise/fatigue. Negative for chills and fever.  HENT: Negative for sore throat.   Eyes: Negative for blurred vision and double vision.  Respiratory: Negative for cough, hemoptysis, shortness of breath, wheezing and stridor.   Cardiovascular: Negative for chest pain, palpitations, orthopnea and leg swelling.  Gastrointestinal: Positive for abdominal pain. Negative for blood in stool, constipation, diarrhea, melena, nausea and vomiting.  Genitourinary: Negative for dysuria, flank pain and hematuria.  Musculoskeletal: Negative for back pain and joint pain.  Skin: Negative for rash.  Neurological: Negative for dizziness, sensory change, focal weakness, seizures, loss of consciousness, weakness and headaches.  Endo/Heme/Allergies: Negative for polydipsia.  Psychiatric/Behavioral: Negative for depression. The patient is not nervous/anxious.     DRUG ALLERGIES:  No Known Allergies VITALS:  Blood pressure (!) 150/99, pulse 70, temperature (!) 97 F (36.1 C), temperature source Tympanic, resp. rate 16, height 5\' 6"  (1.676 m), weight 146 lb 3.2 oz (66.3 kg), SpO2 100 %. PHYSICAL EXAMINATION:  Physical Exam  Constitutional: She is oriented to person, place, and time. She appears well-developed.  HENT:  Head: Normocephalic.  Mouth/Throat: Oropharynx is clear and moist.  Eyes: Pupils are equal, round, and reactive to light. Conjunctivae and EOM are normal. No scleral icterus.  Neck: Normal range of motion. Neck supple. No JVD present. No tracheal deviation present.  Cardiovascular: Normal rate and normal heart sounds. Exam  reveals no gallop.  No murmur heard. Pulmonary/Chest: Effort normal and breath sounds normal. No respiratory distress. She has no wheezes. She has no rales.  Abdominal: Soft. Bowel sounds are normal. She exhibits no distension. There is tenderness. There is no rebound.  Tenderness on RUQ  Musculoskeletal: Normal range of motion. She exhibits no edema or tenderness.  Neurological: She is alert and oriented to person, place, and time. No cranial nerve deficit.  Skin: No rash noted. No erythema.  Psychiatric: She has a normal mood and affect.   LABORATORY PANEL:  Female CBC Recent Labs  Lab 04/29/18 0605  WBC 5.0  HGB 8.4*  HCT 24.6*  PLT 267   ------------------------------------------------------------------------------------------------------------------ Chemistries  Recent Labs  Lab 04/26/18 0546  04/30/18 0137  NA 133*   < > 128*  K 5.8*   < > 4.6  CL 104   < > 97*  CO2 23   < > 24  GLUCOSE 127*   < > 77  BUN 43*   < > 22  CREATININE 1.33*   < > 0.99  CALCIUM 8.6*   < > 8.0*  MG 2.1  --   --    < > = values in this interval not displayed.   RADIOLOGY:  No results found. ASSESSMENT AND PLAN:   74F acute hypoxemic respiratory failure, acute COPD exacerbation, dehydration, hypokalemia, hypoglycemia, renal failure, hypocalcemia, hypoalbuminemia, transaminasemia/hyperbilirubinemia, BNP elevation, Troponin-I elevation, macrocytic anemia.  Acute on chronic respiratory failure with hypoxia and hypercapnia due to COPD exacerbation, radiation pneumonitis/fibrosis and CHF. The patient is off BiPAP, on oxygen by nasal cannula 2L (home O2). Continue nebulizer and DuoNeb as needed, completed Zithromax, discontinued IV Solu-Medrol, changed to prednisone and completed.  New  onset A. fib with RVR. The patient was treated with Cardizem IV without improvement. Cardizem drip was started. Consider anticoagulation for stroke prevention per Dr. Ubaldo Glassing. Echocardiogram showed ejection  fraction 35 to 40%. Changed to p.o. Cardizem and started Eliquis per Dr. Ubaldo Glassing. Cardizem was discontinued and Lopressor was started by Dr.Fath.  Change to Toprol XL 150 mg po daily. Hold Eliquis and started heparin drip for colonoscopy today. Resume heparin drip after colonoscopy per GI.  Elevated troponin due to demanding ischemia.  Chronic systolic CHF LV EF: 23% - 40%. Stable. Lasix as needed.  Hypokalemia. She was given potassium supplement and improved. Hyperkalemia. Hold potassium supplement. She was gIven NovoLog, D50,  calcium gluconate IV and Lokalema, but the potassium was 5.4.   Given D50, NovoLog IV, and Lokelma.    Potassium decreased to 4.6.  Acute renal failure due to ATN and dehydration. Improved with normal saline IV.  Hypomagnesemia and hypophosphatemia. Improved with supplement.  Moderate Malnutritionrelated to chronic illness. Follow dietitian's recommendation.  Abdominal pain, possible due to ischemic colon. The patient was initially suspected due to constipation.  She still has abdominal pain after large bowel movement. Abdominal CAT scan: Focal area of irregular wall thickening and relative narrowing of the right colon at and just below the hepatic flexure extending over approximately 7-8 cm.  Pain control. Per Dr. [name], the patient can get colonoscopy, may stop Eliquis 1 to 2 days prior to endoscopy to lower bleeding potential and complication risk and restart as soon as possible thereafter due to patient maintaining normal sinus rhythm. Colonoscopy: The ulcerated mucosa may be due to ischemia.  Sigmoid diverticulosis. Per Dr. Bonna Gains, Pt. will need surgical intervention as the ulcerated mucosa is circumferential and pt is symptomatic from it (RUQ abdominal pain). Consult surgery and vascular surgery.  Liver function test is unremarkable. Abdominal ultrasound shows severe hepatic steatosis.  Hyponatremia.    Potassium decreased to 128.   Continue normal saline IV.  Follow-up BMP.  Stage I x2 lung cancer status post radiation;remote history of breast cancer.Follow-up of Dr. Rogue Bussing as outpatient.  Hypoglycemia.  Possible due to poor intake.  The patient was given D50, started D5 normal saline IV. Decreased to sensitive sliding scale.  Improved.  The patient need home health and PT.  Discussed with Dr. Rosana Hoes. All the records are reviewed and case discussed with Care Management/Social Worker. Management plans discussed with the patient, her daughter and they are in agreement.   CODE STATUS: Full Code  TOTAL TIME TAKING CARE OF THIS PATIENT: 38 minutes.   More than 50% of the time was spent in counseling/coordination of care: YES  POSSIBLE D/C IN 1 DAYS, DEPENDING ON CLINICAL CONDITION.   Demetrios Loll M.D on 04/30/2018 at 3:34 PM  Between 7am to 6pm - Pager - [phone]  After 6pm go to [email or web address] - [facility]

## 2018-04-30 NOTE — Progress Notes (Signed)
ANTICOAGULATION CONSULT NOTE   Pharmacy Consult for heparin Indication: atrial fibrillation  No Known Allergies  Patient Measurements: Height: 5\' 6"  (167.6 cm) Weight: 146 lb 3.2 oz (66.3 kg) IBW/kg (Calculated) : 59.3 Heparin Dosing Weight: 66.3 kg   Vital Signs: Temp: 97 F (36.1 C) (07/17 1224) Temp Source: Tympanic (07/17 1224) BP: 150/99 (07/17 1244) Pulse Rate: 70 (07/17 1224)  Labs: Recent Labs    04/28/18 0610 04/28/18 1508  04/29/18 0605 04/29/18 1204 04/29/18 1932 04/30/18 0137  HGB  --  8.5*  --  8.4*  --   --   --   HCT  --  25.3*  --  24.6*  --   --   --   PLT  --  282  --  267  --   --   --   APTT  --  37*   < > 137* 128* 96* 98*  LABPROT  --  16.0*  --   --   --   --   --   INR  --  1.29  --   --   --   --   --   HEPARINUNFRC  --  3.00*   < > 2.28* 1.66* 1.31*  --   CREATININE 0.98  --   --   --   --   --  0.99   < > = values in this interval not displayed.    Estimated Creatinine Clearance: 53.7 mL/min (by C-G formula based on SCr of 0.99 mg/dL).   Medical History: Past Medical History:  Diagnosis Date  . Anxiety   . Arthritis   . Cancer ([place]) left    breast cancer 2000, chemo tx's with total mastectomy and lymph nodes resected.   . Cancer of right lung ([place]) 02/21/2016   rad tx's.   [name] COPD (chronic obstructive pulmonary disease) ([place])   . Dependence on supplemental oxygen   . Depression   . Heart murmur   . Hypertension   . Lung nodule   . Lymphedema   . Shortness of breath dyspnea    with exertion  . Status post chemotherapy 2001   left breast cancer  . Status post radiation therapy 2001   left breast cancer    Medications:  Patient was started on apixaban 5mg  BID while inpatient for new onset afib. Last dose of apixaban 5mg  was 7/14 2049.   Assessment: 62 yo female admitted with SOB, cough, and abdominal pain found to have thickening of ascending colon. Patient diagnosed with afib and started on apixaban and cardizem. GI consult  obtained and pending cardiology clear, patient will undergo colonoscopy. Apixaban now on hold pending colonoscopy. Pharmacy consulted to dose and monitor heparin.   Goal of Therapy:  Heparin level 0.3-0.7 units/ml Monitor platelets by anticoagulation protocol: Yes   Plan:  Heparin was held today for procedure and is now being resumed. Will resume heparin and the last rate, 600 units/hr which was therapeutic. Will check an APTT in 6 hours (prior apixaban use and HL and APTT did not corollate this AM) Will need to order HL and APTT  in the AM for correlation  Dabney Schanz D Leia Coletti, Pharm.D, BCPS Clinical Pharmacist 04/30/2018

## 2018-04-30 NOTE — Transfer of Care (Signed)
Immediate Anesthesia Transfer of Care Note  Patient: Danielle [name]  Procedure(s) Performed: COLONOSCOPY (N/A )  Patient Location: PACU  Anesthesia Type:General  Level of Consciousness: sedated  Airway & Oxygen Therapy: Patient Spontanous Breathing and Patient connected to nasal cannula oxygen  Post-op Assessment: Report given to RN and Post -op Vital signs reviewed and stable  Post vital signs: Reviewed and stable  Last Vitals:  Vitals Value Taken Time  BP 133/73 04/30/2018 12:25 PM  Temp 36.1 C 04/30/2018 12:24 PM  Pulse 69 04/30/2018 12:26 PM  Resp 13 04/30/2018 12:26 PM  SpO2 100 % 04/30/2018 12:26 PM  Vitals shown include unvalidated device data.  Last Pain:  Vitals:   04/30/18 1224  TempSrc: Tympanic  PainSc: 0-No pain      Patients Stated Pain Goal: 3 (56/31/49 7026)  Complications: No apparent anesthesia complications

## 2018-04-30 NOTE — Anesthesia Procedure Notes (Signed)
Performed by: Laekyn Rayos, CRNA Pre-anesthesia Checklist: Patient identified, Emergency Drugs available, Suction available, Patient being monitored and Timeout performed Patient Re-evaluated:Patient Re-evaluated prior to induction Oxygen Delivery Method: Nasal cannula Induction Type: IV induction       

## 2018-04-30 NOTE — Anesthesia Preprocedure Evaluation (Addendum)
Anesthesia Evaluation  Patient identified by MRN, date of birth, ID band Patient awake    Reviewed: Allergy & Precautions, H&P , NPO status , reviewed documented beta blocker date and time   Airway Mallampati: II  TM Distance: >3 FB Neck ROM: limited    Dental  (+) Chipped, Poor Dentition   Pulmonary shortness of breath, COPD,  oxygen dependent, former smoker,    Pulmonary exam normal        Cardiovascular hypertension, + DOE  (-) Orthopnea Normal cardiovascular examAtrial Fibrillation + Valvular Problems/Murmurs   04/2018 ECHO Study Conclusions  - Left ventricle: Systolic function was moderately reduced. The   estimated ejection fraction was in the range of 35% to 40%. - Aortic valve: There was very mild stenosis. There was trivial   regurgitation. Valve area (VTI): 1.2 cm^2. Valve area (Vmax):   1.03 cm^2. Valve area (Vmean): 1.13 cm^2. - Mitral valve: There was moderate regurgitation. - Left atrium: The atrium was mildly dilated.   Neuro/Psych PSYCHIATRIC DISORDERS Anxiety Depression    GI/Hepatic   Endo/Other    Renal/GU      Musculoskeletal  (+) Arthritis ,   Abdominal   Peds  Hematology   Anesthesia Other Findings Past Medical History: No date: Anxiety No date: Arthritis left : Cancer ([place])     Comment:  breast cancer 2000, chemo tx's with total mastectomy and              lymph nodes resected.  02/21/2016: Cancer of right lung ([place])     Comment:  rad tx's.  No date: COPD (chronic obstructive pulmonary disease) ([facility]) No date: Dependence on supplemental oxygen No date: Depression No date: Heart murmur No date: Hypertension No date: Lung nodule No date: Lymphedema No date: Shortness of breath dyspnea     Comment:  with exertion 2001: Status post chemotherapy     Comment:  left breast cancer 2001: Status post radiation therapy     Comment:  left breast cancer  Past Surgical History: No date:  Breast Biospy; Left     Comment:  [facility] No date: BREAST SURGERY No date: DILATION AND CURETTAGE OF UTERUS 04/11/2016: ELECTROMAGNETIC NAVIGATION BROCHOSCOPY; Right     Comment:  Procedure: ELECTROMAGNETIC NAVIGATION BRONCHOSCOPY;                Surgeon: Vilinda Boehringer, MD;  Location: [facility] ORS;                Service: Cardiopulmonary;  Laterality: Right; No date: history of colonoscopy] No date: LUNG BIOPSY No date: MASTECTOMY; Left     Comment:  2000, [facility] No date: ROTATOR CUFF REPAIR; Right     Comment:  [facility]  BMI    Body Mass Index:  23.60 kg/m      Reproductive/Obstetrics                            Anesthesia Physical Anesthesia Plan  ASA: IV  Anesthesia Plan: General   Post-op Pain Management:    Induction:   PONV Risk Score and Plan: 3 and Treatment may vary due to age or medical condition and TIVA  Airway Management Planned:   Additional Equipment:   Intra-op Plan:   Post-operative Plan:   Informed Consent: I have reviewed the patients History and Physical, chart, labs and discussed the procedure including the risks, benefits and alternatives for the proposed anesthesia with the patient or authorized representative who has indicated his/her  understanding and acceptance.   Dental Advisory Given  Plan Discussed with: CRNA  Anesthesia Plan Comments:         Anesthesia Quick Evaluation

## 2018-04-30 NOTE — Progress Notes (Signed)
ANTICOAGULATION CONSULT NOTE   Pharmacy Consult for heparin Indication: atrial fibrillation  No Known Allergies  Patient Measurements: Height: 5\' 6"  (167.6 cm) Weight: 146 lb 3.2 oz (66.3 kg) IBW/kg (Calculated) : 59.3 Heparin Dosing Weight: 66.3 kg   Vital Signs: Temp: 97.8 F (36.6 C) (07/16 1941) Temp Source: Oral (07/16 1941) BP: 118/88 (07/16 2121) Pulse Rate: 75 (07/16 2121)  Labs: Recent Labs    04/28/18 0610 04/28/18 1508  04/29/18 0605 04/29/18 1204 04/29/18 1932 04/30/18 0137  HGB  --  8.5*  --  8.4*  --   --   --   HCT  --  25.3*  --  24.6*  --   --   --   PLT  --  282  --  267  --   --   --   APTT  --  37*   < > 137* 128* 96* 98*  LABPROT  --  16.0*  --   --   --   --   --   INR  --  1.29  --   --   --   --   --   HEPARINUNFRC  --  3.00*   < > 2.28* 1.66* 1.31*  --   CREATININE 0.98  --   --   --   --   --  0.99   < > = values in this interval not displayed.    Estimated Creatinine Clearance: 53.7 mL/min (by C-G formula based on SCr of 0.99 mg/dL).   Medical History: Past Medical History:  Diagnosis Date  . Anxiety   . Arthritis   . Cancer ([place]) left    breast cancer 2000, chemo tx's with total mastectomy and lymph nodes resected.   . Cancer of right lung ([place]) 02/21/2016   rad tx's.   [name] COPD (chronic obstructive pulmonary disease) ([place])   . Dependence on supplemental oxygen   . Depression   . Heart murmur   . Hypertension   . Lung nodule   . Lymphedema   . Shortness of breath dyspnea    with exertion  . Status post chemotherapy 2001   left breast cancer  . Status post radiation therapy 2001   left breast cancer    Medications:  Patient was started on apixaban 5mg  BID while inpatient for new onset afib. Last dose of apixaban 5mg  was 7/14 2049.   Assessment: 62 yo female admitted with SOB, cough, and abdominal pain found to have thickening of ascending colon. Patient diagnosed with afib and started on apixaban and cardizem. GI consult  obtained and pending cardiology clear, patient will undergo colonoscopy. Apixaban now on hold pending colonoscopy. Pharmacy consulted to dose and monitor heparin.   Goal of Therapy:  Heparin level 0.3-0.7 units/ml Monitor platelets by anticoagulation protocol: Yes   Plan:  07/15 @ 2200 aPTT 147 sec supratherapeutic. Will decrease rate to 800 units/hr and will recheck aPTT/HL @ 0600. CBC check in the morning.  07/16 @ 0600 aPTT 137 supratherapeutic. Will decrease rate to 700 units/hr and will recheck anti-Xa @ 1200. CBC stable will continue to monitor. Awaiting HL to determine correlation  07/16 @1200  APTT 128 supratherapeutic with goal APTT of 66-102. Will decrease rate to 600 units/hr and recheck APTT/HL in 6 hours. CBC stable. Pharmacy will continue to monitor.   07/16 19:30 aPTT 96 is within range, Heparin level 1.21 still does not correlate. Will continue with current rate of 600 units/hr and recheck aPTT in 6 hours.  07/17 @ 0130 aPTT 98 therapeutic. Will continue current rate and will recheck aPTT/HL w/ am labs.  Tobie Lords, PharmD, BCPS Clinical Pharmacist 04/30/2018

## 2018-04-30 NOTE — Op Note (Addendum)
[facility] Gastroenterology Patient Name: Danielle [name] Procedure Date: 04/30/2018 11:50 AM MRN: [id number] Account #: [id number] Date of Birth: 11-24-1955 Admit Type: Outpatient Age: 62 Room: [facility] ENDO ROOM 1 Gender: Female Note Status: Finalized Procedure:            Colonoscopy Indications:          Abdominal pain in the right upper quadrant, Abnormal CT                        of the GI tract Providers:            Czar Ysaguirre B. Bonna Gains MD, MD Medicines:            Monitored Anesthesia Care Complications:        No immediate complications. Procedure:            Pre-Anesthesia Assessment:                       - Prior to the procedure, a History and Physical was                        performed, and patient medications, allergies and                        sensitivities were reviewed. The patient's tolerance of                        previous anesthesia was reviewed.                       - The risks and benefits of the procedure and the                        sedation options and risks were discussed with the                        patient. All questions were answered and informed                        consent was obtained.                       - Patient identification and proposed procedure were                        verified prior to the procedure by the physician, the                        nurse, the anesthesiologist, the anesthetist and the                        technician. The procedure was verified in the                        pre-procedure area in the procedure room in the                        endoscopy suite.                       - Prophylactic Antibiotics: The patient does not  require prophylactic antibiotics.                       - ASA Grade Assessment: III - A patient with severe                        systemic disease.                       - After reviewing the risks and benefits, the patient   was deemed in satisfactory condition to undergo the                        procedure.                       - Monitored anesthesia care was determined to be                        medically necessary for this procedure based on review                        of the patient's medical history, medications, and                        prior anesthesia history.                       - The anesthesia plan was to use monitored anesthesia                        care (MAC).                       After obtaining informed consent, the colonoscope was                        passed under direct vision. Throughout the procedure,                        the patient's blood pressure, pulse, and oxygen                        saturations were monitored continuously. The                        Colonoscope was introduced through the anus and                        advanced to the the ileocecal valve. The colonoscopy                        was performed with ease. The patient tolerated the                        procedure well. The quality of the bowel preparation                        was fair. Findings:      The perianal and digital rectal examinations were normal.      A continuous area of nonbleeding ulcerated mucosa with no stigmata of       recent  bleeding was present at the hepatic flexure. Biopsies were taken       with a cold forceps for histology.      The ulcerated mucosa may be due to ischemia. A specific mass was not       seen in the area.      The ulcerated mucosa was circumferential but non obstucting and the       colonoscope was able to advanced proximal to the site.      The colonoscope was advanced to the ileocecal valve. The cecum was not       intubated as the colonoscope would loop upon any attempt to advance into       the cecum. To prevent looping of the scope at the ulcerated site at the       hepatic flexure, it was best not to advance the scope to the cecum and       was withdrawn  from the ileocecal valve.      A few small-mouthed diverticula were found in the sigmoid colon.      The retroflexed view of the distal rectum and anal verge was normal and       showed no anal or rectal abnormalities. Impression:           - Preparation of the colon was fair. This was not a                        screening exam for polyps and pt will need a screening                        colonoscopy in the near future once acute medical                        issues are adressed.                       - Mucosal ulceration. Biopsied.                       - The ulcerated mucosa may be due to ischemia. A                        specific mass was not seen in the area.                       - The ulcerated mucosa was circumferential but non                        obstucting and the colonoscope was able to advanced                        proximal to the site.                       - Diverticulosis in the sigmoid colon.                       - The distal rectum and anal verge are normal on                        retroflexion view. Recommendation:       - Await pathology results.                       -  Pt. will need surgical intervention as the ulcerated                        mucosa is circumferential and pt is symptomatic from it                        (RUQ abdominal pain). Consult surgery in this regard.                       - Due to the ulcerated mucosa in the colon pt. is at                        risk of bleeding from this site. Would weigh risks and                        benefits of anticoagulation prior to restarting. If                        anticoagulation is needed would consider a medication                        with a reversal agent. Discuss with surgery prior to                        resuming anticoagulation as well.                       - Vascular surgery consult/Consider CTA to evaluate for                        ischemia                       - Clear liquid diet.                        - Return patient to hospital ward for ongoing care.                       - Continue present medications.                       - The findings and recommendations were discussed with                        the patient. Procedure Code(s):    --- Professional ---                       [phone], Colonoscopy, flexible; with biopsy, single or                        multiple Diagnosis Code(s):    --- Professional ---                       K63.3, Ulcer of intestine                       R10.11, Right upper quadrant pain                       K57.30, Diverticulosis of large intestine without  perforation or abscess without bleeding                       R93.3, Abnormal findings on diagnostic imaging of other                        parts of digestive tract CPT copyright 2017 American [employer]. All rights reserved. The codes documented in this report are preliminary and upon coder review may  be revised to meet current compliance requirements.  [name], MD Margretta Sidle B. Bonna Gains MD, MD 04/30/2018 12:36:53 PM This report has been signed electronically. Number of Addenda: 0 Note Initiated On: 04/30/2018 11:50 AM Total Procedure Duration: 0 hours 21 minutes 2 seconds  Estimated Blood Loss: Estimated blood loss: none.      [facility]

## 2018-05-01 ENCOUNTER — Encounter: Payer: Self-pay | Admitting: Gastroenterology

## 2018-05-01 ENCOUNTER — Inpatient Hospital Stay: Payer: Medicaid Other

## 2018-05-01 DIAGNOSIS — K559 Vascular disorder of intestine, unspecified: Secondary | ICD-10-CM

## 2018-05-01 DIAGNOSIS — K55031 Focal (segmental) acute (reversible) ischemia of large intestine: Secondary | ICD-10-CM

## 2018-05-01 DIAGNOSIS — R600 Localized edema: Secondary | ICD-10-CM

## 2018-05-01 LAB — CBC
HCT: 24.1 % — ABNORMAL LOW (ref 35.0–47.0)
HEMOGLOBIN: 8 g/dL — AB (ref 12.0–16.0)
MCH: 33.7 pg (ref 26.0–34.0)
MCHC: 33 g/dL (ref 32.0–36.0)
MCV: 102.1 fL — AB (ref 80.0–100.0)
PLATELETS: 288 10*3/uL (ref 150–440)
RBC: 2.36 MIL/uL — AB (ref 3.80–5.20)
RDW: 15 % — ABNORMAL HIGH (ref 11.5–14.5)
WBC: 5.8 10*3/uL (ref 3.6–11.0)

## 2018-05-01 LAB — SURGICAL PATHOLOGY

## 2018-05-01 LAB — BASIC METABOLIC PANEL
ANION GAP: 8 (ref 5–15)
BUN: 16 mg/dL (ref 8–23)
CALCIUM: 7.8 mg/dL — AB (ref 8.9–10.3)
CO2: 22 mmol/L (ref 22–32)
Chloride: 102 mmol/L (ref 98–111)
Creatinine, Ser: 0.86 mg/dL (ref 0.44–1.00)
GFR calc Af Amer: >60 mL/min (ref >60)
Glucose, Bld: 113 mg/dL — ABNORMAL HIGH (ref 70–99)
POTASSIUM: 4.1 mmol/L (ref 3.5–5.1)
SODIUM: 132 mmol/L — AB (ref 135–145)

## 2018-05-01 LAB — GLUCOSE, CAPILLARY
GLUCOSE-CAPILLARY: 104 mg/dL — AB (ref 70–99)
GLUCOSE-CAPILLARY: 96 mg/dL (ref 70–99)
Glucose-Capillary: 100 mg/dL — ABNORMAL HIGH (ref 70–99)
Glucose-Capillary: 103 mg/dL — ABNORMAL HIGH (ref 70–99)

## 2018-05-01 LAB — HEPARIN LEVEL (UNFRACTIONATED)
Heparin Unfractionated: 0.35 IU/mL (ref 0.30–0.70)
Heparin Unfractionated: 0.38 IU/mL (ref 0.30–0.70)

## 2018-05-01 LAB — MAGNESIUM: Magnesium: 1.3 mg/dL — ABNORMAL LOW (ref 1.7–2.4)

## 2018-05-01 LAB — APTT: aPTT: 80 seconds — ABNORMAL HIGH (ref 24–36)

## 2018-05-01 MED ORDER — IOPAMIDOL (ISOVUE-370) INJECTION 76%
100.0000 mL | Freq: Once | INTRAVENOUS | Status: AC | PRN
  Administered 2018-05-01: 100 mL via INTRAVENOUS

## 2018-05-01 MED ORDER — FUROSEMIDE 20 MG PO TABS
20.0000 mg | ORAL_TABLET | Freq: Every day | ORAL | Status: DC
  Administered 2018-05-01 – 2018-05-02 (×2): 20 mg via ORAL
  Filled 2018-05-01 (×2): qty 1

## 2018-05-01 MED ORDER — METOPROLOL SUCCINATE ER 50 MG PO TB24
150.0000 mg | ORAL_TABLET | Freq: Every day | ORAL | Status: DC
  Administered 2018-05-02 – 2018-05-03 (×2): 150 mg via ORAL
  Filled 2018-05-01 (×3): qty 3

## 2018-05-01 MED ORDER — MAGNESIUM SULFATE 4 GM/100ML IV SOLN
4.0000 g | Freq: Once | INTRAVENOUS | Status: AC
  Administered 2018-05-01: 4 g via INTRAVENOUS
  Filled 2018-05-01: qty 100

## 2018-05-01 NOTE — Plan of Care (Signed)
  Problem: Health Behavior/Discharge Planning: Goal: Ability to manage health-related needs will improve Outcome: Progressing   Problem: Pain Managment: Goal: General experience of comfort will improve Outcome: Progressing   Problem: Safety: Goal: Ability to remain free from injury will improve Outcome: Progressing   

## 2018-05-01 NOTE — Consult Note (Signed)
Consult received Have ordered CT angiogram and will see later this afternoon in hopes that has been done

## 2018-05-01 NOTE — Anesthesia Postprocedure Evaluation (Signed)
Anesthesia Post Note  Patient: Danielle [name]  Procedure(s) Performed: COLONOSCOPY (N/A )  Patient location during evaluation: Endoscopy Anesthesia Type: General Level of consciousness: awake and alert Pain management: pain level controlled Vital Signs Assessment: post-procedure vital signs reviewed and stable Respiratory status: spontaneous breathing, nonlabored ventilation and respiratory function stable Cardiovascular status: blood pressure returned to baseline and stable Postop Assessment: no apparent nausea or vomiting Anesthetic complications: no     Last Vitals:  Vitals:   04/30/18 2009 05/01/18 0339  BP:  (!) 127/94  Pulse: 72 66  Resp: 18 19  Temp:  36.6 C  SpO2: 94% 99%    Last Pain:  Vitals:   05/01/18 0339  TempSrc: Oral  PainSc:                  Alphonsus Sias

## 2018-05-01 NOTE — Progress Notes (Signed)
[facility] [place] NAME: Danielle [name]    MR#:  [id number]  DATE OF BIRTH:  [birth year]  SUBJECTIVE:  CHIEF COMPLAINT:   Chief Complaint  Patient presents with  . Shortness of Breath   The patient better RUQ abdominal pain, NPO for CT angiogram of her abdomen. REVIEW OF SYSTEMS:  Review of Systems  Constitutional: Positive for malaise/fatigue. Negative for chills and fever.  HENT: Negative for sore throat.   Eyes: Negative for blurred vision and double vision.  Respiratory: Negative for cough, hemoptysis, shortness of breath, wheezing and stridor.   Cardiovascular: Negative for chest pain, palpitations, orthopnea and leg swelling.  Gastrointestinal: Positive for abdominal pain. Negative for blood in stool, constipation, diarrhea, melena, nausea and vomiting.  Genitourinary: Negative for dysuria, flank pain and hematuria.  Musculoskeletal: Negative for back pain and joint pain.  Skin: Negative for rash.  Neurological: Negative for dizziness, sensory change, focal weakness, seizures, loss of consciousness, weakness and headaches.  Endo/Heme/Allergies: Negative for polydipsia.  Psychiatric/Behavioral: Negative for depression. The patient is not nervous/anxious.     DRUG ALLERGIES:  No Known Allergies VITALS:  Blood pressure (!) 139/96, pulse 68, temperature 98.2 F (36.8 C), temperature source Oral, resp. rate 19, height 5\' 6"  (1.676 m), weight 153 lb 12.8 oz (69.8 kg), SpO2 100 %. PHYSICAL EXAMINATION:  Physical Exam  Constitutional: She is oriented to person, place, and time. She appears well-developed.  HENT:  Head: Normocephalic.  Mouth/Throat: Oropharynx is clear and moist.  Eyes: Pupils are equal, round, and reactive to light. Conjunctivae and EOM are normal. No scleral icterus.  Neck: Normal range of motion. Neck supple. No JVD present. No tracheal deviation present.  Cardiovascular: Normal rate and normal heart sounds.  Exam reveals no gallop.  No murmur heard. Pulmonary/Chest: Effort normal and breath sounds normal. No respiratory distress. She has no wheezes. She has no rales.  Abdominal: Soft. Bowel sounds are normal. She exhibits no distension. There is no tenderness. There is no rebound.  Musculoskeletal: Normal range of motion. She exhibits no edema or tenderness.  Neurological: She is alert and oriented to person, place, and time. No cranial nerve deficit.  Skin: No rash noted. No erythema.  Psychiatric: She has a normal mood and affect.   LABORATORY PANEL:  Female CBC Recent Labs  Lab 05/01/18 0510  WBC 5.8  HGB 8.0*  HCT 24.1*  PLT 288   ------------------------------------------------------------------------------------------------------------------ Chemistries  Recent Labs  Lab 05/01/18 0510  NA 132*  K 4.1  CL 102  CO2 22  GLUCOSE 113*  BUN 16  CREATININE 0.86  CALCIUM 7.8*  MG 1.3*   RADIOLOGY:  Ct Angio Abd/pel W/ And/or W/o  Result Date: 05/01/2018 CLINICAL DATA:  Ischemia, abdomen pelvis. Pt states she has abdominal fluid, pain across lower abdomen several weeks. acute hypoxemic respiratory failure, acute COPD exacerbation, dehydration, hypokalemia, hypoglycemia, renal failure, hypocalcemia, hypoalbuminemia, transaminasemia/hyperbilirubinemia, BNP elevation, Troponin-I elevation, macrocytic anemia. EXAM: CTA ABDOMEN AND PELVIS WITH CONTRAST TECHNIQUE: Multidetector CT imaging of the abdomen and pelvis was performed using the standard protocol during bolus administration of intravenous contrast. Multiplanar reconstructed images and MIPs were obtained and reviewed to evaluate the vascular anatomy. CONTRAST:  161mL ISOVUE-370 IOPAMIDOL (ISOVUE-370) INJECTION 76% COMPARISON:  04/27/2018 FINDINGS: VASCULAR Coronary calcifications.  Mild four-chamber cardiac enlargement. Aorta: Coarse calcified atheromatous plaque. No aneurysm, dissection, or high-grade stenosis. Celiac: Heavily  calcified ostial plaque resulting in origin stenosis over length of less than 1 cm of  possible hemodynamic significance, patent distally. SMA: Coarse calcified plaque extending over length of at least 7 cm, without high-grade stenosis. 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 This superior left renal artery has calcified ostial plaque resulting in short segment stenosis of at least mild severity. There is also ostial calcified plaque involving the inferior left renal artery without definite high-grade stenosis. Single right renal artery with calcified ostial plaque, no high-grade stenosis. IMA: Diminutive, patent Inflow: Eccentric calcified nonocclusive plaque through bilateral iliac arterial systems without high-grade stenosis or occlusion. No aneurysm. Proximal Outflow: Bilateral common femoral and visualized portions of the superficial and profunda femoral arteries are patent without evidence of aneurysm, dissection, vasculitis or significant stenosis. Veins: Patent hepatic veins, portal vein, SMV. Diminutive or occluded splenic vein. No significant gastric varices or retroperitoneal collateral venous channels. Patent bilateral renal veins, IVC and iliac venous system. Review of the MIP images confirms the above findings. NON-VASCULAR Lower chest: Bilateral pleural effusions right greater than left. Dependent atelectasis posteriorly in both lower lobes. Hepatobiliary: Diffuse fatty infiltration without focal lesion. Gallbladder is incompletely distended. No biliary ductal dilatation. Pancreas: Unremarkable. No pancreatic ductal dilatation or surrounding inflammatory changes. Spleen: Small.  No focal lesion. Adrenals/Urinary Tract: Normal adrenals. Normal renal parenchymal enhancement. No hydronephrosis. Urinary bladder incompletely distended. Stomach/Bowel: Stomach is incompletely distended. Small bowel is nondilated. Appendix not discretely identified. Scattered colonic diverticula. Suspected mild  circumferential wall thickening in the distal ascending segment and a hepatic flexure extending into proximal transverse segment as before, without evidence of obstruction. Mild regional inflammatory/edematous changes. No abscess. Colon is decompressed distally. Lymphatic: No abdominal or pelvic adenopathy. Reproductive: Uterus and bilateral adnexa are unremarkable. Tubal ligation clips. Other: Trace pelvic ascites.  No free air. Musculoskeletal: Mild L4 superior endplate compression deformity as before. No acute fracture or worrisome bone lesion. IMPRESSION: VASCULAR 1. Extensive aortoiliac atherosclerosis (ICD10-170.0) without stenosis or aneurysm. 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 Patent superior and inferior mesenteric arteries likely render this clinically insignificant. 3. Superior left renal artery ostial stenosis of indeterminate hemodynamic significance. NON-VASCULAR 1. Persistent wall thickening in the colon centered at the hepatic flexure, without obstruction. 2. Progressive pleural effusions right greater than left 3. Fatty liver 4. Scattered colonic diverticula. Electronically Signed   By: Lucrezia Europe M.D.   On: 05/01/2018 14:43   ASSESSMENT AND PLAN:   1F acute hypoxemic respiratory failure, acute COPD exacerbation, dehydration, hypokalemia, hypoglycemia, renal failure, hypocalcemia, hypoalbuminemia, transaminasemia/hyperbilirubinemia, BNP elevation, Troponin-I elevation, macrocytic anemia.  Acute on chronic respiratory failure with hypoxia and hypercapnia due to COPD exacerbation, radiation pneumonitis/fibrosis and CHF. The patient is off BiPAP, on oxygen by nasal cannula 2L (home O2). Continue nebulizer and DuoNeb as needed, completed Zithromax, discontinued IV Solu-Medrol, changed to prednisone and completed.  New onset A. fib with RVR. The patient was treated with Cardizem IV without improvement. Cardizem drip was started. Consider anticoagulation for stroke prevention per Dr.  Ubaldo Glassing. Echocardiogram showed ejection fraction 35 to 40%. Changed to p.o. Cardizem and started Eliquis per Dr. Ubaldo Glassing. Cardizem was discontinued and Lopressor was started by Dr.Fath.  Change to Toprol XL 150 mg po daily. Hold Eliquis and continue heparin drip.[name] Per Dr. [name], may resume Eliquis.  Elevated troponin due to demanding ischemia.  Chronic systolic CHF LV EF: 83% - 40%. Stable. Resume Lasix.  Hypokalemia. She was given potassium supplement and improved. Hyperkalemia. Hold potassium supplement. She was gIven NovoLog, D50,  calcium gluconate IV and Lokalema, but the potassium was 5.4.   Given D50, NovoLog  IV, and Lokelma.    Improved.  Acute renal failure due to ATN and dehydration. Improved with normal saline IV.   Hypomagnesemia and hypophosphatemia. Improved with supplement. Hypomagnesemia.  Mg 1.3. Given magnesium IV and follow-up level.  Moderate Malnutritionrelated to chronic illness. Follow dietitian's recommendation.  Abdominal pain, possible due to ischemic colon. The patient was initially suspected due to constipation.  She still has abdominal pain after large bowel movement. Abdominal CAT scan: Focal area of irregular wall thickening and relative narrowing of the right colon at and just below the hepatic flexure extending over approximately 7-8 cm.  Pain control. Per Dr. [name], the patient can get colonoscopy, may stop Eliquis 1 to 2 days prior to endoscopy to lower bleeding potential and complication risk and restart as soon as possible thereafter due to patient maintaining normal sinus rhythm. Colonoscopy: The ulcerated mucosa may be due to ischemia.  Sigmoid diverticulosis. Per Dr. Bonna Gains, Pt. will need surgical intervention as the ulcerated mucosa is circumferential and pt is symptomatic from it (RUQ abdominal pain). Would discuss with cardiology prior to resuming anticoagulation, as patient is at high risk of bleeding from the area given how  ulcerated was.  If anticoagulation is resumed, would recommend monitoring for 1-2 days as an inpatient prior to discharge.    Would recommend CBCs every 12-24 hours while inpatient. I discussed with Dr. Bea Laura, who suggested resuming Eliquis.  Per Dr. Rosana Hoes, the patient has high risk for surgery for surgery at this time.  Clear liquid diet.  May need TPN. CT angiogram of abdomen per Dr. Lucky Cowboy.  Liver function test is unremarkable. Abdominal ultrasound shows severe hepatic steatosis.  Hyponatremia.    Potassium up to 132.  Continue D5NS IV due to n.p.o. status.   Stage I x2 lung cancer status post radiation;remote history of breast cancer.Follow-up of Dr. Rogue Bussing as outpatient.  Hypoglycemia.  Possible due to poor intake.  The patient was given D50, started D5 normal saline IV. Decreased to sensitive sliding scale.  Improved.  The patient need home health and PT.  Discussed with Dr. Rosana Hoes, Dr. Bonna Gains and Dr. Bea Laura. All the records are reviewed and case discussed with Care Management/Social Worker. Management plans discussed with the patient, her daughter and they are in agreement.   CODE STATUS: Full Code  TOTAL TIME TAKING CARE OF THIS PATIENT: 48 minutes.   More than 50% of the time was spent in counseling/coordination of care: YES  POSSIBLE D/C IN 3 DAYS, DEPENDING ON CLINICAL CONDITION.   Demetrios Loll M.D on 05/01/2018 at 4:16 PM  Between 7am to 6pm - Pager - [phone]  After 6pm go to [email or web address] - [facility]

## 2018-05-01 NOTE — Progress Notes (Signed)
Patient ID: Danielle [name], female   DOB: 1955-11-26, 62 y.o.   MRN: [id number]  This NP visited patient at the bedside as a follow up to for palliative medicine needs and emotional support.  Patient is alert and oriented and is less interested  in conversation with me today.   We did however speak to the various diagnostic tests she has had over the past several days.  She is waiting for results of all tests.  "I just want to go home"  Recommend OP palliative services on discharge.  Discussed with her the importance of continued conversation with her family and her medical providers regarding overall plan of care and treatment options,  ensuring decisions are within the context of her values and GOCs.  Discussed the importance of documentation of advanced directives and HPOA.  Questions and concerns addressed   I explained to the patient that I would not be at the hospital  over the next few  Weeks but she is encouraged to call for support from a PMT provider.  If there are specific PMT needs please paged provider listed in [place]  Total time spent on the unit was 20 minutes  Greater than 50% of the time was spent in counseling and coordination of care  Wadie Lessen NP  Palliative Medicine Team Team Phone # [phone] Pager [phone]

## 2018-05-01 NOTE — Progress Notes (Signed)
Md instructs RN to order soft diet. I will continue to assess.

## 2018-05-01 NOTE — Consult Note (Signed)
[facility] Vascular Consult Note  MRN : [id number]  Danielle [name] is a 62 y.o. (18-Mar-1956) female who presents with chief complaint of  Chief Complaint  Patient presents with  . Shortness of Breath  .  History of Present Illness: I am asked by Dr. Bonna Gains to see the patient regarding severe colitis that was likely ischemic.  The patient was admitted with right upper quadrant abdominal pain.  She had an initial noncontrasted CT scan which I have reviewed which showed extensive calcification of her visceral vessels and aorta but no contrast was done that could evaluate her flow.  She underwent a colonoscopy with a circumferential lesion near her hepatic flexure where biopsies were taken.  Those are pending.  There was significant concern that this could be an ischemic lesion and so we were consulted.  The patient does have less abdominal pain today it is not entirely gone but better.  Her shortness of breath is better.  She is anxious to eat and go home.  No fevers or chills overnight.  I ordered a CT angiogram today which I have independently reviewed.  This demonstrates stenosis of the celiac artery, but no significant stenosis in the superior mesenteric artery or inferior mesenteric artery which are patent with good blood flow.  She does have some significant aortoiliac calcification and there was some renal artery disease as well.  Current Facility-Administered Medications  Medication Dose Route Frequency Provider Last Rate Last Dose  . acetaminophen (TYLENOL) tablet 650 mg  650 mg Oral Q6H PRN Arta Silence, MD   650 mg at 04/22/18 2251   Or  . acetaminophen (TYLENOL) suppository 650 mg  650 mg Rectal Q6H PRN Arta Silence, MD      . bisacodyl (DULCOLAX) EC tablet 5 mg  5 mg Oral Daily PRN Arta Silence, MD      . bisacodyl (DULCOLAX) suppository 10 mg  10 mg Rectal Daily PRN Demetrios Loll, MD   10 mg at 04/25/18 1249  . citalopram (CELEXA) tablet  20 mg  20 mg Oral Daily Arta Silence, MD   20 mg at 05/01/18 1054  . dextrose 5 %-0.9 % sodium chloride infusion   Intravenous Continuous Demetrios Loll, MD 50 mL/hr at 05/01/18 1047    . dextrose 50 % solution 25 g  25 g Intravenous PRN Arta Silence, MD   25 g at 04/27/18 1154  . feeding supplement (BOOST / RESOURCE BREEZE) liquid 1 Container  1 Container Oral TID BM Demetrios Loll, MD   1 Container at 05/01/18 1400  . folic acid (FOLVITE) tablet 1 mg  1 mg Oral Daily Demetrios Loll, MD   1 mg at 05/01/18 1515  . furosemide (LASIX) tablet 20 mg  20 mg Oral Daily Demetrios Loll, MD   20 mg at 05/01/18 1722  . heparin ADULT infusion 100 units/mL (25000 units/256mL sodium chloride 0.45%)  700 Units/hr Intravenous Continuous Demetrios Loll, MD 7 mL/hr at 04/30/18 2323 700 Units/hr at 04/30/18 2323  . HYDROcodone-acetaminophen (NORCO/VICODIN) 5-325 MG per tablet 1-2 tablet  1-2 tablet Oral Q4H PRN Demetrios Loll, MD   2 tablet at 05/01/18 1514  . insulin aspart (novoLOG) injection 0-9 Units  0-9 Units Subcutaneous TID WC Demetrios Loll, MD   2 Units at 04/29/18 1651  . ipratropium-albuterol (DUONEB) 0.5-2.5 (3) MG/3ML nebulizer solution 3 mL  3 mL Nebulization Q4H PRN Demetrios Loll, MD   3 mL at 05/01/18 1722  . ipratropium-albuterol (DUONEB) 0.5-2.5 (3) MG/3ML nebulizer  solution 3 mL  3 mL Nebulization TID Demetrios Loll, MD   3 mL at 05/01/18 1332  . magnesium oxide (MAG-OX) tablet 400 mg  400 mg Oral Daily Teodoro Spray, MD   400 mg at 05/01/18 1054  . [START ON 05/02/2018] metoprolol succinate (TOPROL-XL) 24 hr tablet 150 mg  150 mg Oral Daily Demetrios Loll, MD      . metoprolol tartrate (LOPRESSOR) tablet 75 mg  75 mg Oral BID Demetrios Loll, MD   75 mg at 05/01/18 3875  . mometasone-formoterol (DULERA) 200-5 MCG/ACT inhaler 2 puff  2 puff Inhalation BID Flora Lipps, MD   2 puff at 05/01/18 6433  . morphine 2 MG/ML injection 2 mg  2 mg Intravenous Q4H PRN Demetrios Loll, MD   2 mg at 04/27/18 2049  . multivitamin with minerals  tablet 1 tablet  1 tablet Oral Daily Flora Lipps, MD   1 tablet at 05/01/18 1054  . ondansetron (ZOFRAN) tablet 4 mg  4 mg Oral Q6H PRN Arta Silence, MD       Or  . ondansetron (ZOFRAN) injection 4 mg  4 mg Intravenous Q6H PRN Arta Silence, MD   4 mg at 04/23/18 0904  . senna-docusate (Senokot-S) tablet 1 tablet  1 tablet Oral QHS PRN Arta Silence, MD   1 tablet at 04/23/18 0010  . sodium phosphate (FLEET) 7-19 GM/118ML enema 1 enema  1 enema Rectal Once Demetrios Loll, MD      . thiamine (VITAMIN B-1) tablet 100 mg  100 mg Oral Daily Demetrios Loll, MD   100 mg at 05/01/18 1515    Past Medical History:  Diagnosis Date  . Anxiety   . Arthritis   . Cancer ([place]) left    breast cancer 2000, chemo tx's with total mastectomy and lymph nodes resected.   . Cancer of right lung ([place]) 02/21/2016   rad tx's.   . CHF (congestive heart failure) ([place])   . COPD (chronic obstructive pulmonary disease) ([place])   . Dependence on supplemental oxygen   . Depression   . Diabetes mellitus without complication ([place])   . Heart murmur   . Hypertension   . Lung nodule   . Lymphedema   . Shortness of breath dyspnea    with exertion  . Status post chemotherapy 2001   left breast cancer  . Status post radiation therapy 2001   left breast cancer    Past Surgical History:  Procedure Laterality Date  . Breast Biospy Left    [facility]  . BREAST SURGERY    . COLONOSCOPY N/A 04/30/2018   Procedure: COLONOSCOPY;  Surgeon: Virgel Manifold, MD;  Location: [facility] ENDOSCOPY;  Service: Endoscopy;  Laterality: N/A;  . DILATION AND CURETTAGE OF UTERUS    . ELECTROMAGNETIC NAVIGATION BROCHOSCOPY Right 04/11/2016   Procedure: ELECTROMAGNETIC NAVIGATION BRONCHOSCOPY;  Surgeon: Vilinda Boehringer, MD;  Location: [facility] ORS;  Service: Cardiopulmonary;  Laterality: Right;  . history of colonoscopy]    . LUNG BIOPSY    . MASTECTOMY Left    2000, [facility]  . ROTATOR CUFF REPAIR Right    [facility]    Social History Social  History   Tobacco Use  . Smoking status: Former Smoker    Packs/day: 0.50    Years: 20.00    Pack years: 10.00    Types: Cigarettes    Last attempt to quit: 07/02/2012    Years since quitting: 5.8  . Smokeless tobacco: Never Used  . Tobacco comment: quit 2014  Substance Use Topics  . Alcohol use: Yes    Comment: Occasionally  . Drug use: No    Family History Family History  Problem Relation Age of Onset  . Breast cancer Mother 58  . Cancer Mother        Breast   . Cirrhosis Father   . Breast cancer Paternal Aunt 67  . Cancer Maternal Aunt        Breast     No Known Allergies   REVIEW OF SYSTEMS (Negative unless checked)  Constitutional: [] Weight loss  [] Fever  [] Chills Cardiac: [] Chest pain   [] Chest pressure   [] Palpitations   [] Shortness of breath when laying flat   [x] Shortness of breath at rest   [x] Shortness of breath with exertion. Vascular:  [] Pain in legs with walking   [] Pain in legs at rest   [] Pain in legs when laying flat   [] Claudication   [] Pain in feet when walking  [] Pain in feet at rest  [] Pain in feet when laying flat   [] History of DVT   [] Phlebitis   [] Swelling in legs   [] Varicose veins   [] Non-healing ulcers Pulmonary:   [] Uses home oxygen   [] Productive cough   [] Hemoptysis   [] Wheeze  [] COPD   [] Asthma Neurologic:  [] Dizziness  [] Blackouts   [] Seizures   [] History of stroke   [] History of TIA  [] Aphasia   [] Temporary blindness   [] Dysphagia   [] Weakness or numbness in arms   [] Weakness or numbness in legs Musculoskeletal:  [x] Arthritis   [] Joint swelling   [] Joint pain   [] Low back pain Hematologic:  [] Easy bruising  [] Easy bleeding   [] Hypercoagulable state   [] Anemic  [] Hepatitis Gastrointestinal:  [x] Blood in stool   [] Vomiting blood  [x] Gastroesophageal reflux/heartburn   [] Difficulty swallowing. Genitourinary:  [] Chronic kidney disease   [] Difficult urination  [] Frequent urination  [] Burning with urination   [] Blood in urine Skin:  [] Rashes    [] Ulcers   [] Wounds Psychological:  [] History of anxiety   []  History of major depression.  Physical Examination  Vitals:   05/01/18 0355 05/01/18 0754 05/01/18 0812 05/01/18 1707  BP:   (!) 139/96 (!) 158/106  Pulse:   68 73  Resp:      Temp:   98.2 F (36.8 C) (!) 97.4 F (36.3 C)  TempSrc:   Oral Oral  SpO2:  99% 100% 100%  Weight: 153 lb 12.8 oz (69.8 kg)     Height:       Body mass index is 24.82 kg/m. Gen:  WD/WN, NAD Head: [place]/AT, No temporalis wasting. Ear/Nose/Throat: Hearing grossly intact, nares w/o erythema or drainage, oropharynx w/o Erythema/Exudate Eyes: Sclera non-icteric, conjunctiva clear Neck: Trachea midline.   Pulmonary:  Good air movement, respirations not labored, equal bilaterally.  Cardiac: RRR, no JVD Vascular:  Vessel Right Left  Radial Palpable Palpable                                   Gastrointestinal: soft, mildly distended and mild to moderately tender in the right upper quadrant and mid abdomen. Musculoskeletal: M/S 5/5 throughout.  Extremities without ischemic changes.  No deformity or atrophy.  Mild to moderate lower extremity edema. Neurologic: Sensation grossly intact in extremities.  Symmetrical.  Speech is fluent. Motor exam as listed above. Psychiatric: Judgment intact, Mood & affect appropriate for pt's clinical situation. Dermatologic: No rashes or ulcers noted.  No cellulitis or open  wounds.       CBC Lab Results  Component Value Date   WBC 5.8 05/01/2018   HGB 8.0 (L) 05/01/2018   HCT 24.1 (L) 05/01/2018   MCV 102.1 (H) 05/01/2018   PLT 288 05/01/2018    BMET    Component Value Date/Time   NA 132 (L) 05/01/2018 0510   NA 132 (L) 02/09/2015 1100   K 4.1 05/01/2018 0510   K 3.8 02/09/2015 1100   CL 102 05/01/2018 0510   CL 95 (L) 02/09/2015 1100   CO2 22 05/01/2018 0510   CO2 29 02/09/2015 1100   GLUCOSE 113 (H) 05/01/2018 0510   GLUCOSE 105 (H) 02/09/2015 1100   BUN 16 05/01/2018 0510   BUN 16  02/09/2015 1100   CREATININE 0.86 05/01/2018 0510   CREATININE 0.81 02/09/2015 1100   CALCIUM 7.8 (L) 05/01/2018 0510   CALCIUM 9.1 02/09/2015 1100   GFRNONAA >60 05/01/2018 0510   GFRNONAA >60 02/09/2015 1100   GFRAA >60 05/01/2018 0510   GFRAA >60 02/09/2015 1100   Estimated Creatinine Clearance: 61.9 mL/min (by C-G formula based on SCr of 0.86 mg/dL).  COAG Lab Results  Component Value Date   INR 1.29 04/28/2018   INR 0.90 07/30/2016   INR 1.10 04/03/2016    Radiology Ct Abdomen Pelvis Wo Contrast  Result Date: 04/27/2018 CLINICAL DATA:  89F acute hypoxemic respiratory failure, acute COPD exacerbation, dehydration, hypokalemia, hypoglycemia, renal failure, hypocalcemia, hypoalbuminemia, transaminasemia/hyperbilirubinemia, BNP elevation, Troponin-I elevation, macrocytic anemiaHx of lung and breast cancer EXAM: CT ABDOMEN AND PELVIS WITHOUT CONTRAST TECHNIQUE: Multidetector CT imaging of the abdomen and pelvis was performed following the standard protocol without IV contrast. COMPARISON:  PET-CT, 07/13/2016 FINDINGS: Lower chest: Small right pleural effusion. Minor dependent right lower lobe atelectasis. No evidence of pneumonia or pulmonary edema. Hepatobiliary: Markedly decreased attenuation of the liver consistent with extensive fatty infiltration. No liver mass or focal lesion. Gallbladder is contracted but otherwise unremarkable. No bile duct dilation. Pancreas: Unremarkable. No pancreatic ductal dilatation or surrounding inflammatory changes. Spleen: Small spleen.  No splenic mass or focal lesion. Adrenals/Urinary Tract: Adrenal glands are unremarkable. Kidneys are normal, without renal calculi, focal lesion, or hydronephrosis. Bladder is unremarkable. Stomach/Bowel: There is irregular wall thickening with associated narrowing of the ascending colon at and just inferior to the hepatic flexure, extending for 7-8 cm in length. Small area of inflammatory type stranding is seen along the  lateral margin of this portion of the colon abutting the inferior margin of the liver. No other colonic wall thickening or evidence of inflammation. Stomach and small bowel are unremarkable. Cecum is distended to 6.6 cm. Normal appendix is visualized. Vascular/Lymphatic: Extensive aortic atherosclerotic calcifications extending into the branch vessels. No aneurysm. No adenopathy. Reproductive: Uterus and bilateral adnexa are unremarkable. Other: No abdominal wall hernia or abnormality. No abdominopelvic ascites. Musculoskeletal: Mild compression deformity of L5, of unclear chronicity, but knows likely chronic. There is no associated periatrial edema or hemorrhage. No other fractures. No osteoblastic or osteolytic lesions. IMPRESSION: 1. Focal area of irregular wall thickening and relative narrowing of the right colon at and just below the hepatic flexure extending over approximately 7-8 cm. This may be infectious or inflammatory or neoplastic in etiology. Recommend follow-up colonoscopy. No other evidence of an acute abnormality. 2. Extensive hepatic steatosis. 3. Small right pleural effusion. 4. Extensive aortic atherosclerosis. 5. Mild compression fracture of L5 of unclear chronicity but most likely chronic. Electronically Signed   By: Lajean Manes M.D.   On:  04/27/2018 14:56   Ct Chest Wo Contrast  Result Date: 04/21/2018 CLINICAL DATA:  62 year old female with a history of abnormal chest x-ray and prior known right-sided lung carcinoma status post treatment EXAM: CT CHEST WITHOUT CONTRAST TECHNIQUE: Multidetector CT imaging of the chest was performed following the standard protocol without IV contrast. COMPARISON:  Chest x-ray today's date, prior CT 09/12/2017, 03/01/2017, PET-CT 07/13/2016 FINDINGS: Cardiovascular: Cardiac size unchanged. No pericardial fluid/thickening. Dense calcifications of left main, left anterior descending, circumflex, right coronary arteries. Calcifications of the aortic valve.  Calcifications of the transverse arch and descending thoracic aorta. No aneurysm. Incidental calcifications of the suprarenal aorta of the upper abdomen. Diameter of the main pulmonary artery unremarkable. Mediastinum/Nodes: Small lymph nodes of the mediastinum, including paratracheal nodes unchanged in number from the comparison CT. Unchanged subcarinal lymph node. Lungs/Pleura: The abnormality on today's chest x-ray corresponds 2 consolidation/architectural distortion and confluent opacity of the right upper lobe which is more pronounced than the comparison CT of 09/12/2017. Volume loss with linear and nodular opacity of the right upper lobe, extends along the peribronchovascular Cher of right upper lobe bronchi to the pleura anteriorly and medially. Nodular changes are inseparable from the hilar structures with volume loss and contraction of the proximal airways of the right upper lobe. Bronchus intermedius and the middle lobe bronchi remain patent. Right lower lobe airways remain patent. No confluent airspace disease. Diameter of the nodularity on image 47 measures 3.5 cm x 2.9 cm, and more inferiorly measures 4.8 cm x 3.5 cm. All of these changes are in the treated area from the prior CT. Left lung demonstrates similar emphysematous changes and scarring at the apex. No new nodule/mass identified on the left. No confluent airspace disease. Small right greater than left pleural effusions, new from the prior. Upper Abdomen: Steatosis. Musculoskeletal: No acute displaced fracture. Degenerative changes of the spine. No new aggressive bony lesion identified. IMPRESSION: The abnormality of this chest x-ray corresponds to nodular/consolidative changes of the right upper lobe with architectural distortion in the area of prior therapy. 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 Correlation with the patient's prior treatment may be useful, as well as potential future imaging with contrast-enhanced CT chest or alternatively PET-CT. Small lymph nodes of the mediastinum, without increasing lymphadenopathy. Small right greater than left pleural effusion. Emphysema.  Emphysema (ICD10-J43.9). Aortic atherosclerosis with left main and 3 vessel coronary artery disease. Aortic Atherosclerosis (ICD10-I70.0). Hepatic steatosis. Electronically Signed   By: Corrie Mckusick D.O.   On: 04/21/2018 17:28   US Venous Img Lower Bilateral  Result Date: 04/21/2018 CLINICAL DATA:  Bilateral lower extremity pain and edema. History of fall 2 weeks ago. History of breast cancer. Evaluate for DVT. EXAM: BILATERAL LOWER EXTREMITY VENOUS DOPPLER ULTRASOUND TECHNIQUE: Gray-scale sonography with graded compression, as well as color Doppler and duplex ultrasound were performed to evaluate the lower extremity deep venous systems from the level of the common femoral vein and including the common femoral, femoral, profunda femoral, popliteal and calf veins including the posterior tibial, peroneal and gastrocnemius veins when visible. The superficial great saphenous vein was also interrogated. Spectral Doppler was utilized to evaluate flow at rest and with distal augmentation maneuvers in the common femoral, femoral and popliteal veins. COMPARISON:  None. FINDINGS: RIGHT LOWER EXTREMITY Common Femoral Vein: No evidence of thrombus. Normal compressibility, respiratory phasicity and response to augmentation. Saphenofemoral Junction: No evidence  of thrombus. Normal compressibility and flow on color Doppler imaging. Profunda Femoral Vein: No evidence of thrombus. Normal compressibility and flow on color Doppler imaging. Femoral Vein: No evidence of thrombus. Normal compressibility, respiratory phasicity and response to augmentation. Popliteal Vein: No evidence of thrombus. Normal compressibility, respiratory phasicity  and response to augmentation. Calf Veins: No evidence of thrombus. Normal compressibility and flow on color Doppler imaging. Superficial Great Saphenous Vein: No evidence of thrombus. Normal compressibility. Venous Reflux:  None. Other Findings:  None. LEFT LOWER EXTREMITY Common Femoral Vein: No evidence of thrombus. Normal compressibility, respiratory phasicity and response to augmentation. Saphenofemoral Junction: No evidence of thrombus. Normal compressibility and flow on color Doppler imaging. Profunda Femoral Vein: No evidence of thrombus. Normal compressibility and flow on color Doppler imaging. Femoral Vein: No evidence of thrombus. Normal compressibility, respiratory phasicity and response to augmentation. Popliteal Vein: No evidence of thrombus. Normal compressibility, respiratory phasicity and response to augmentation. Calf Veins: No evidence of thrombus. Normal compressibility and flow on color Doppler imaging. Superficial Great Saphenous Vein: No evidence of thrombus. Normal compressibility. Venous Reflux:  None. Other Findings:  None. IMPRESSION: No evidence of DVT within either lower extremity. Electronically Signed   By: Sandi Mariscal M.D.   On: 04/21/2018 14:13   Dg Chest Port 1 View  Result Date: 04/21/2018 CLINICAL DATA:  Initial evaluation for acute shortness of breath, history of COPD. EXAM: PORTABLE CHEST 1 VIEW COMPARISON:  Prior radiograph from 04/19/2017 as well as prior CT from 09/12/2017. FINDINGS: Mild cardiomegaly, stable. Aortic atherosclerosis. Increased masslike density at the right hilum measuring approximately 3.7 x 3.5 cm. Adjacent right perihilar scarring and architectural distortion. No other focal infiltrates. No pleural effusion or pulmonary edema. Chronic bronchitic changes. No pneumothorax. No acute osseous abnormality. IMPRESSION: 1. Increased prominence of 3.5 cm mass like opacity at the right hilum, concerning for worsened malignancy. Further assessment with  cross-sectional imaging of the chest suggested for further characterization. 2. Underlying chronic emphysematous changes. 3. Cardiomegaly with aortic atherosclerosis. Electronically Signed   By: Jeannine Boga M.D.   On: 04/21/2018 04:54   Dg Abd Portable 1v  Result Date: 04/28/2018 CLINICAL DATA:  62 y.o. female with history of COPD, lung cancer status post SBRT, admitted with worsening shortness of breath and cough. GI consulted for CT abdomen revealing ascending colon thickening.Pt. Reports right upper quadrant, right mid quadrant abdominal pain. Cramping, intermittent, 5/10, nonradiating, associated with nausea. No vomiting.Reports having a bowel movement on this admission, and passing gas. No obstructive symptoms. Poor appetite. EXAM: PORTABLE ABDOMEN - 1 VIEW COMPARISON:  CT, 04/27/2018. FINDINGS: There is no bowel dilation to suggest obstruction or significant adynamic ileus. There are scattered aortic and iliac artery vascular calcifications. Soft tissues are otherwise unremarkable. Residual contrast is noted in the colon. No acute skeletal abnormality IMPRESSION: 1. No acute findings.  No evidence of bowel obstruction. Electronically Signed   By: Lajean Manes M.D.   On: 04/28/2018 15:26   Ct Angio Abd/pel W/ And/or W/o  Result Date: 05/01/2018 CLINICAL DATA:  Ischemia, abdomen pelvis. Pt states she has abdominal fluid, pain across lower abdomen several weeks. acute hypoxemic respiratory failure, acute COPD exacerbation, dehydration, hypokalemia, hypoglycemia, renal failure, hypocalcemia, hypoalbuminemia, transaminasemia/hyperbilirubinemia, BNP elevation, Troponin-I elevation, macrocytic anemia. EXAM: CTA ABDOMEN AND PELVIS WITH CONTRAST TECHNIQUE: Multidetector CT imaging of the abdomen and pelvis was performed using the standard protocol during bolus administration of intravenous contrast. Multiplanar reconstructed images and MIPs were obtained and reviewed to evaluate the vascular anatomy.  CONTRAST:  163mL ISOVUE-370 IOPAMIDOL (ISOVUE-370) INJECTION 76% COMPARISON:  04/27/2018 FINDINGS: VASCULAR Coronary calcifications.  Mild four-chamber cardiac enlargement. Aorta: Coarse calcified atheromatous plaque. No aneurysm, dissection, or high-grade stenosis. Celiac: Heavily calcified ostial plaque resulting in origin stenosis over length of less than 1 cm of possible hemodynamic significance, patent distally. SMA: Coarse calcified plaque extending over length of at least 7 cm, without high-grade stenosis. 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 This superior left renal artery has calcified ostial plaque resulting in short segment stenosis of at least mild severity. There is also ostial calcified plaque involving the inferior left renal artery without definite high-grade stenosis. Single right renal artery with calcified ostial plaque, no high-grade stenosis. IMA: Diminutive, patent Inflow: Eccentric calcified nonocclusive plaque through bilateral iliac arterial systems without high-grade stenosis or occlusion. No aneurysm. Proximal Outflow: Bilateral common femoral and visualized portions of the superficial and profunda femoral arteries are patent without evidence of aneurysm, dissection, vasculitis or significant stenosis. Veins: Patent hepatic veins, portal vein, SMV. Diminutive or occluded splenic vein. No significant gastric varices or retroperitoneal collateral venous channels. Patent bilateral renal veins, IVC and iliac venous system. Review of the MIP images confirms the above findings. NON-VASCULAR Lower chest: Bilateral pleural effusions right greater than left. Dependent atelectasis posteriorly in both lower lobes. Hepatobiliary: Diffuse fatty infiltration without focal lesion. Gallbladder is incompletely distended. No biliary ductal dilatation. Pancreas: Unremarkable. No pancreatic ductal dilatation or surrounding inflammatory changes. Spleen: Small.  No focal lesion. Adrenals/Urinary Tract:  Normal adrenals. Normal renal parenchymal enhancement. No hydronephrosis. Urinary bladder incompletely distended. Stomach/Bowel: Stomach is incompletely distended. Small bowel is nondilated. Appendix not discretely identified. Scattered colonic diverticula. Suspected mild circumferential wall thickening in the distal ascending segment and a hepatic flexure extending into proximal transverse segment as before, without evidence of obstruction. Mild regional inflammatory/edematous changes. No abscess. Colon is decompressed distally. Lymphatic: No abdominal or pelvic adenopathy. Reproductive: Uterus and bilateral adnexa are unremarkable. Tubal ligation clips. Other: Trace pelvic ascites.  No free air. Musculoskeletal: Mild L4 superior endplate compression deformity as before. No acute fracture or worrisome bone lesion. IMPRESSION: VASCULAR 1. Extensive aortoiliac atherosclerosis (ICD10-170.0) without stenosis or aneurysm. 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 Patent superior and inferior mesenteric arteries likely render this clinically insignificant. 3. Superior left renal artery ostial stenosis of indeterminate hemodynamic significance. NON-VASCULAR 1. Persistent wall thickening in the colon centered at the hepatic flexure, without obstruction. 2. Progressive pleural effusions right greater than left 3. Fatty liver 4. Scattered colonic diverticula. Electronically Signed   By: Lucrezia Europe M.D.   On: 05/01/2018 14:43   US Abdomen Limited Ruq  Result Date: 04/23/2018 CLINICAL DATA:  Elevated bilirubin. EXAM: ULTRASOUND ABDOMEN LIMITED RIGHT UPPER QUADRANT COMPARISON:  PET-CT dated July 13, 2016. FINDINGS: Gallbladder: Contracted. No gallstones or wall thickening visualized. No sonographic Murphy sign noted by sonographer. Common bile duct: Diameter: 6-7 mm, at the upper limits of normal in size. Liver: No focal lesion identified. Diffusely increased in parenchymal echogenicity. 9 mm simple cyst in the right  hepatic lobe. Portal vein is patent on color Doppler imaging with normal direction of blood flow towards the liver. IMPRESSION: 1. No acute abnormality. 2. Severe hepatic steatosis. Electronically Signed   By: Titus Dubin M.D.   On: 04/23/2018 16:34      Assessment/Plan 1.  Colitis.  Possibly ischemic.  CT angiogram shows patent IMA and SMA so large vessel disease is likely not the cause of the colitis.  As such, no role for  revascularization or other vascular surgery intervention at this time. 2.  Lower extremity edema.  Recent DVT study was negative.  Elevate and use compression stockings as needed as well as increase activity for her swelling 3.  Diabetes.  Stable on outpatient medications and blood glucose control important in reducing the progression of atherosclerotic disease. Also, involved in wound healing. On appropriate medications. 4.  HTN. Stable on outpatient medications and blood pressure control important in reducing the progression of atherosclerotic disease. On appropriate oral medications.    Leotis Pain, MD  05/01/2018 5:36 PM    This note was created with Dragon medical transcription system.  Any error is purely unintentional

## 2018-05-01 NOTE — Progress Notes (Signed)
Danielle Antigua, MD [address], [place], [place], [place], [address] [address], [place], [place], [place], [address] Phone: [phone]  Fax: [phone]   Subjective:  Patient continues to report right upper quadrant abdominal pain, but states it is better.  Tolerating oral diet.  No clinical evidence of obstruction.  Objective: Exam: Vital signs in last 24 hours: Vitals:   05/01/18 0339 05/01/18 0355 05/01/18 0754 05/01/18 0812  BP: (!) 127/94   (!) 139/96  Pulse: 66   68  Resp: 19     Temp: 97.8 F (36.6 C)   98.2 F (36.8 C)  TempSrc: Oral   Oral  SpO2: 99%  99% 100%  Weight:  153 lb 12.8 oz (69.8 kg)    Height:       Weight change:   Intake/Output Summary (Last 24 hours) at 05/01/2018 1136 Last data filed at 05/01/2018 9417 Gross per 24 hour  Intake 1036.62 ml  Output 750 ml  Net 286.62 ml    General: No acute distress, AAO x3 Abd: Soft, NT/ND, No HSM Skin: Warm, no rashes Neck: Supple, Trachea midline   Lab Results: Lab Results  Component Value Date   WBC 5.8 05/01/2018   HGB 8.0 (L) 05/01/2018   HCT 24.1 (L) 05/01/2018   MCV 102.1 (H) 05/01/2018   PLT 288 05/01/2018   Micro Results: Recent Results (from the past 240 hour(s))  C difficile quick scan w PCR reflex     Status: None   Collection Time: 04/27/18  2:36 PM  Result Value Ref Range Status   C Diff antigen NEGATIVE NEGATIVE Final   C Diff toxin NEGATIVE NEGATIVE Final   C Diff interpretation No C. difficile detected.  Final    Comment: Performed at [facility], [address] [place], [place] [address]   Studies/Results: No results found. Medications:  Scheduled Meds: . citalopram  20 mg Oral Daily  . feeding supplement  1 Container Oral TID BM  . folic acid  1 mg Oral Daily  . insulin aspart  0-9 Units Subcutaneous TID WC  . ipratropium-albuterol  3 mL Nebulization TID  . magnesium oxide  400 mg Oral Daily  . metoprolol tartrate  75 mg Oral BID  .  mometasone-formoterol  2 puff Inhalation BID  . multivitamin with minerals  1 tablet Oral Daily  . sodium phosphate  1 enema Rectal Once  . thiamine  100 mg Oral Daily   Continuous Infusions: . dextrose 5 % and 0.9% NaCl 50 mL/hr at 05/01/18 1047  . heparin 700 Units/hr (04/30/18 2323)   PRN Meds:.acetaminophen **OR** acetaminophen, bisacodyl, bisacodyl, dextrose, HYDROcodone-acetaminophen, ipratropium-albuterol, morphine injection, ondansetron **OR** ondansetron (ZOFRAN) IV, senna-docusate   Assessment: Active Problems:   Malignant neoplasm of upper lobe of right lung (HCC)   Acute hypoxemic respiratory failure (HCC)   Malnutrition of moderate degree   Palliative care by specialist   DNR (do not resuscitate) discussion   Weakness generalized   COPD exacerbation ([place])   Generalized abdominal pain   Colonic thickening   Ulceration of intestine   Abdominal pain, right upper quadrant   Diverticulosis of large intestine without diverticulitis   Abnormal CT scan, colon    Plan: Large circumferential ulceration seen at the hepatic flexure on colonoscopy yesterday Official biopsy results pending, but Dr. Bryan Lemma  called me with preliminary results.  No malignancy noted on pathology results, findings consistent with ischemic changes as per pathologist. Vascular surgery consulted, and they are planning  on doing CTA Dr. Rosana Hoes following from surgery  Given patient had A. fib with RVR during admission was admission, question if this could be due to an embolic phenomenon to the area Would discuss with cardiology prior to resuming anticoagulation, as patient is at high risk of bleeding from the area given how ulcerated was.  If anticoagulation is resumed, would recommend monitoring for 1-2 days as an inpatient prior to discharge.   Would recommend CBCs every 12-24 hours while inpatient    LOS: 10 days   Danielle Antigua, MD 05/01/2018, 11:36 AM

## 2018-05-01 NOTE — Progress Notes (Signed)
ANTICOAGULATION CONSULT NOTE   Pharmacy Consult for heparin Indication: atrial fibrillation  No Known Allergies  Patient Measurements: Height: 5\' 6"  (167.6 cm) Weight: 153 lb 12.8 oz (69.8 kg) IBW/kg (Calculated) : 59.3 Heparin Dosing Weight: 66.3 kg   Vital Signs: Temp: 98.2 F (36.8 C) (07/18 0812) Temp Source: Oral (07/18 0812) BP: 139/96 (07/18 0812) Pulse Rate: 68 (07/18 0812)  Labs: Recent Labs    04/28/18 1508  04/29/18 0605  04/29/18 1932 04/30/18 0137 04/30/18 2200 05/01/18 0510 05/01/18 1105  HGB 8.5*  --  8.4*  --   --   --   --  8.0*  --   HCT 25.3*  --  24.6*  --   --   --   --  24.1*  --   PLT 282  --  267  --   --   --   --  288  --   APTT 37*   < > 137*   < > 96* 98* 63* 80*  --   LABPROT 16.0*  --   --   --   --   --   --   --   --   INR 1.29  --   --   --   --   --   --   --   --   HEPARINUNFRC 3.00*   < > 2.28*   < > 1.31*  --   --  0.35 0.38  CREATININE  --   --   --   --   --  0.99  --  0.86  --    < > = values in this interval not displayed.    Estimated Creatinine Clearance: 61.9 mL/min (by C-G formula based on SCr of 0.86 mg/dL).   Medical History: Past Medical History:  Diagnosis Date  . Anxiety   . Arthritis   . Cancer ([place]) left    breast cancer 2000, chemo tx's with total mastectomy and lymph nodes resected.   . Cancer of right lung ([place]) 02/21/2016   rad tx's.   [name] COPD (chronic obstructive pulmonary disease) ([place])   . Dependence on supplemental oxygen   . Depression   . Heart murmur   . Hypertension   . Lung nodule   . Lymphedema   . Shortness of breath dyspnea    with exertion  . Status post chemotherapy 2001   left breast cancer  . Status post radiation therapy 2001   left breast cancer    Medications:  Patient was started on apixaban 5mg  BID while inpatient for new onset afib. Last dose of apixaban 5mg  was 7/14 2049.   Assessment: 62 yo female admitted with SOB, cough, and abdominal pain found to have thickening of  ascending colon. Patient diagnosed with afib and started on apixaban and cardizem. GI consult obtained and pending cardiology clear, patient will undergo colonoscopy. Apixaban now on hold pending colonoscopy. Pharmacy consulted to dose and monitor heparin.   Goal of Therapy:  Heparin level 0.3-0.7 units/ml Monitor platelets by anticoagulation protocol: Yes   Plan:  Heparin level is therapeutic. Continue drip at 700 units/hr and check level in the AM as this is the second therapeutic level. Can dose off of HL now as both APTT and HL correlate.   Ramond Dial, Pharm.D, BCPS Clinical Pharmacist 05/01/2018

## 2018-05-01 NOTE — Progress Notes (Signed)
ANTICOAGULATION CONSULT NOTE   Pharmacy Consult for heparin Indication: atrial fibrillation  No Known Allergies  Patient Measurements: Height: 5\' 6"  (167.6 cm) Weight: 153 lb 12.8 oz (69.8 kg) IBW/kg (Calculated) : 59.3 Heparin Dosing Weight: 66.3 kg   Vital Signs: Temp: 97.8 F (36.6 C) (07/18 0339) Temp Source: Oral (07/18 0339) BP: 127/94 (07/18 0339) Pulse Rate: 66 (07/18 0339)  Labs: Recent Labs    04/28/18 1508  04/29/18 0605 04/29/18 1204 04/29/18 1932 04/30/18 0137 04/30/18 2200 05/01/18 0510  HGB 8.5*  --  8.4*  --   --   --   --  8.0*  HCT 25.3*  --  24.6*  --   --   --   --  24.1*  PLT 282  --  267  --   --   --   --  288  APTT 37*   < > 137* 128* 96* 98* 63* 80*  LABPROT 16.0*  --   --   --   --   --   --   --   INR 1.29  --   --   --   --   --   --   --   HEPARINUNFRC 3.00*   < > 2.28* 1.66* 1.31*  --   --  0.35  CREATININE  --   --   --   --   --  0.99  --  0.86   < > = values in this interval not displayed.    Estimated Creatinine Clearance: 61.9 mL/min (by C-G formula based on SCr of 0.86 mg/dL).   Medical History: Past Medical History:  Diagnosis Date  . Anxiety   . Arthritis   . Cancer ([place]) left    breast cancer 2000, chemo tx's with total mastectomy and lymph nodes resected.   . Cancer of right lung ([place]) 02/21/2016   rad tx's.   [name] COPD (chronic obstructive pulmonary disease) ([place])   . Dependence on supplemental oxygen   . Depression   . Heart murmur   . Hypertension   . Lung nodule   . Lymphedema   . Shortness of breath dyspnea    with exertion  . Status post chemotherapy 2001   left breast cancer  . Status post radiation therapy 2001   left breast cancer    Medications:  Patient was started on apixaban 5mg  BID while inpatient for new onset afib. Last dose of apixaban 5mg  was 7/14 2049.   Assessment: 62 yo female admitted with SOB, cough, and abdominal pain found to have thickening of ascending colon. Patient diagnosed with  afib and started on apixaban and cardizem. GI consult obtained and pending cardiology clear, patient will undergo colonoscopy. Apixaban now on hold pending colonoscopy. Pharmacy consulted to dose and monitor heparin.   Goal of Therapy:  Heparin level 0.3-0.7 units/ml Monitor platelets by anticoagulation protocol: Yes   Plan:  07/15 @ 2200 aPTT 147 sec supratherapeutic. Will decrease rate to 800 units/hr and will recheck aPTT/HL @ 0600. CBC check in the morning.  07/16 @ 0600 aPTT 137 supratherapeutic. Will decrease rate to 700 units/hr and will recheck anti-Xa @ 1200. CBC stable will continue to monitor. Awaiting HL to determine correlation  07/16 @1200  APTT 128 supratherapeutic with goal APTT of 66-102. Will decrease rate to 600 units/hr and recheck APTT/HL in 6 hours. CBC stable. Pharmacy will continue to monitor.   07/16 19:30 aPTT 96 is within range, Heparin level 1.21 still does not correlate.  Will continue with current rate of 600 units/hr and recheck aPTT in 6 hours.  07/17 @ 0130 aPTT 98 therapeutic. Will continue current rate and will recheck aPTT/HL w/ am labs.  07/17 @ 2200 aPTT 63 subtherapeutic. Will increase rate to 700 units/hr and will recheck aPTT/HL w/ am labs. Will monitor CBC w/ am labs.  07/18 @ 0500 aPTT 80, HL 0.35 both levels therapeutic and correlating, will dose off HL. Will continue current rate and will recheck HL @ 1100. Hgb slowly trending down will continue to monitor.  Tobie Lords, PharmD, BCPS Clinical Pharmacist 05/01/2018

## 2018-05-01 NOTE — Progress Notes (Signed)
SURGICAL PROGRESS NOTE (cpt [phone])  Hospital Day(s): 10.   Post op day(s): 1 Day Post-Op.   Interval History: Patient seen and examined, no acute events or new complaints overnight. Patient reports much improved RUQ "mild soreness" with +flatus, denies N/V, fever/chills, CP, or SOB.  Review of Systems:  Constitutional: denies fever, chills  HEENT: denies cough or congestion  Respiratory: denies any shortness of breath  Cardiovascular: denies chest pain or palpitations  Gastrointestinal: abdominal pain, N/V, and bowel function as per interval history Genitourinary: denies burning with urination or urinary frequency Musculoskeletal: denies pain, decreased motor or sensation Integumentary: denies any other rashes or skin discolorations Neurological: denies HA or vision/hearing changes   Vital signs in last 24 hours: [min-max] current  Temp:  [97.8 F (36.6 C)-98.3 F (36.8 C)] 98.2 F (36.8 C) (07/18 0812) Pulse Rate:  [66-75] 68 (07/18 0812) Resp:  [17-19] 19 (07/18 0339) BP: (127-154)/(94-98) 139/96 (07/18 0812) SpO2:  [94 %-100 %] 100 % (07/18 0812) Weight:  [153 lb 12.8 oz (69.8 kg)] 153 lb 12.8 oz (69.8 kg) (07/18 0355)     Height: 5\' 6"  (167.6 cm) Weight: 153 lb 12.8 oz (69.8 kg) BMI (Calculated): 24.84   Intake/Output this shift:  Total I/O In: 265.1 [I.V.:265.1] Out: 400 [Urine:400]   Intake/Output last 2 shifts:  @IOLAST2SHIFTS @   Physical Exam:  Constitutional: alert, cooperative and no distress  HENT: normocephalic without obvious abnormality  Eyes: PERRL, EOM's grossly intact and symmetric  Neuro: CN II - XII grossly intact and symmetric without deficit  Respiratory: breathing non-labored at rest  Cardiovascular: regular rate and sinus rhythm  Gastrointestinal: soft and non-distended with minimal RUQ tenderness with only deep palpation, no guarding or rebound tenderness Musculoskeletal: UE and LE FROM, motor and sensation grossly intact, NT   Labs:  CBC  Latest Ref Rng & Units 05/01/2018 04/29/2018 04/28/2018  WBC 3.6 - 11.0 K/uL 5.8 5.0 5.3  Hemoglobin 12.0 - 16.0 g/dL 8.0(L) 8.4(L) 8.5(L)  Hematocrit 35.0 - 47.0 % 24.1(L) 24.6(L) 25.3(L)  Platelets 150 - 440 K/uL 288 267 282   CMP Latest Ref Rng & Units 05/01/2018 04/30/2018 04/28/2018  Glucose 70 - 99 mg/dL 113(H) 77 97  BUN 8 - 23 mg/dL 16 22 28(H)  Creatinine 0.44 - 1.00 mg/dL 0.86 0.99 0.98  Sodium 135 - 145 mmol/L 132(L) 128(L) 131(L)  Potassium 3.5 - 5.1 mmol/L 4.1 4.6 4.7  Chloride 98 - 111 mmol/L 102 97(L) 99  CO2 22 - 32 mmol/L 22 24 23   Calcium 8.9 - 10.3 mg/dL 7.8(L) 8.0(L) 8.8(L)  Total Protein 6.5 - 8.1 g/dL - - -  Total Bilirubin 0.3 - 1.2 mg/dL - - -  Alkaline Phos 38 - 126 U/L - - -  AST 15 - 41 U/L - - -  ALT 0 - 44 U/L - - -   Imaging studies:  Colonoscopy (05/01/2018) - Mucosal ulceration. Biopsied. - The ulcerated mucosa may be due to ischemia. A specific mass was not seen in the area. - The ulcerated mucosa was circumferential but non obstucting and the colonoscope was able to advanced proximal to the site. - Diverticulosis in the sigmoid colon. - The distal rectum and anal verge are normal on retroflexion view.   Assessment/Plan: (ICD-10's: K55.03) 62 y.o. female with focal ascending colitis/thickening/narrowing x 7 - 8 cm just inferior to hepatic flexure with overt ulceration visualized on colonoscopy suggestive of subacute ischemia of uncertain depth (symptoms began 2 weeks ago, 3 days prior to admission, possibly  mucosal sloughing without transmural necrosis considering mild pain and WBC WNL?), possibly secondary to recently diagnosed atrial fibrillation previously not on anticoagulation (large vessel etiology far less likely considering distribution of colitis and ulceration), complicated by pertinent comorbidities including HTN, severe COPD on home supplemental oxygen prn, Right lung cancer s/p radiation with known Right hilar mass, cardiac murmur, lymphedema,  generalized anxiety disorder, major depression disorder, and history of Left breast cancer s/p mastectomy and chemoradiation.   - avoid hypotension  - TPN for malnutrition, prolonged NPO  - nothing to eat more than clear liquids diet  - pain control as needed (minimize narcotics)  - monitor abdominal exam and bowel function  - patient is not low cardiac and pulmonary risk for surgery and is accordingly hard to justify when considering risks vs benefits with only "mild soreness", normal WBC, and +flatus without perforation or obstruction  - discussed at length with patient anticipated high risks of surgery vs uncertain potential risks with antibiotics and supportive care without surgery  - only consider gradual advancement of diet when pain resolves completely   - antibiotics, medical management of comorbidities  - will continue to follow, no surgery at this time  All of the above findings and recommendations were discussed with the patient, medical physician, GI, and patient's RN, and all of patient's questions were answered to her expressed satisfaction.  Thank you for the opportunity to participate in this patient's care.  -- Marilynne Drivers Rosana Hoes, MD, [place]: [facility] - Partnering for exceptional care. Office: [phone]

## 2018-05-02 ENCOUNTER — Inpatient Hospital Stay: Payer: Self-pay | Admitting: Internal Medicine

## 2018-05-02 ENCOUNTER — Inpatient Hospital Stay: Payer: Self-pay

## 2018-05-02 ENCOUNTER — Inpatient Hospital Stay: Payer: Medicaid Other

## 2018-05-02 LAB — BASIC METABOLIC PANEL
Anion gap: 9 (ref 5–15)
BUN: 13 mg/dL (ref 8–23)
CALCIUM: 8.1 mg/dL — AB (ref 8.9–10.3)
CO2: 19 mmol/L — AB (ref 22–32)
Chloride: 99 mmol/L (ref 98–111)
Creatinine, Ser: 0.78 mg/dL (ref 0.44–1.00)
GFR calc Af Amer: >60 mL/min (ref >60)
GLUCOSE: 99 mg/dL (ref 70–99)
Potassium: 4.3 mmol/L (ref 3.5–5.1)
Sodium: 127 mmol/L — ABNORMAL LOW (ref 135–145)

## 2018-05-02 LAB — CBC
HEMATOCRIT: 25 % — AB (ref 35.0–47.0)
HEMOGLOBIN: 8.6 g/dL — AB (ref 12.0–16.0)
MCH: 35 pg — ABNORMAL HIGH (ref 26.0–34.0)
MCHC: 34.2 g/dL (ref 32.0–36.0)
MCV: 102.4 fL — ABNORMAL HIGH (ref 80.0–100.0)
Platelets: 298 10*3/uL (ref 150–440)
RBC: 2.45 MIL/uL — AB (ref 3.80–5.20)
RDW: 15.2 % — ABNORMAL HIGH (ref 11.5–14.5)
WBC: 6.9 10*3/uL (ref 3.6–11.0)

## 2018-05-02 LAB — HEPATIC FUNCTION PANEL
ALBUMIN: 3 g/dL — AB (ref 3.5–5.0)
ALK PHOS: 112 U/L (ref 38–126)
ALT: 127 U/L — ABNORMAL HIGH (ref 0–44)
AST: 88 U/L — AB (ref 15–41)
BILIRUBIN TOTAL: 1.6 mg/dL — AB (ref 0.3–1.2)
Bilirubin, Direct: 0.8 mg/dL — ABNORMAL HIGH (ref 0.0–0.2)
Indirect Bilirubin: 0.8 mg/dL (ref 0.3–0.9)
TOTAL PROTEIN: 6.3 g/dL — AB (ref 6.5–8.1)

## 2018-05-02 LAB — GLUCOSE, CAPILLARY
GLUCOSE-CAPILLARY: 131 mg/dL — AB (ref 70–99)
GLUCOSE-CAPILLARY: 154 mg/dL — AB (ref 70–99)
GLUCOSE-CAPILLARY: 123 mg/dL — AB (ref 70–99)
Glucose-Capillary: 104 mg/dL — ABNORMAL HIGH (ref 70–99)
Glucose-Capillary: 152 mg/dL — ABNORMAL HIGH (ref 70–99)

## 2018-05-02 LAB — HEPARIN LEVEL (UNFRACTIONATED): HEPARIN UNFRACTIONATED: 0.23 [IU]/mL — AB (ref 0.30–0.70)

## 2018-05-02 LAB — BRAIN NATRIURETIC PEPTIDE

## 2018-05-02 LAB — MAGNESIUM: MAGNESIUM: 1.9 mg/dL (ref 1.7–2.4)

## 2018-05-02 LAB — PHOSPHORUS: Phosphorus: 3.1 mg/dL (ref 2.5–4.6)

## 2018-05-02 MED ORDER — INSULIN ASPART 100 UNIT/ML ~~LOC~~ SOLN
0.0000 [IU] | SUBCUTANEOUS | Status: DC
  Administered 2018-05-02: 2 [IU] via SUBCUTANEOUS
  Administered 2018-05-02 – 2018-05-04 (×8): 1 [IU] via SUBCUTANEOUS
  Administered 2018-05-05 (×2): 2 [IU] via SUBCUTANEOUS
  Administered 2018-05-05 (×2): 1 [IU] via SUBCUTANEOUS
  Administered 2018-05-05 (×3): 2 [IU] via SUBCUTANEOUS
  Administered 2018-05-06 – 2018-05-10 (×4): 1 [IU] via SUBCUTANEOUS
  Administered 2018-05-10 (×2): 2 [IU] via SUBCUTANEOUS
  Administered 2018-05-11 – 2018-05-12 (×6): 1 [IU] via SUBCUTANEOUS
  Filled 2018-05-02 (×29): qty 1

## 2018-05-02 MED ORDER — FUROSEMIDE 10 MG/ML IJ SOLN
20.0000 mg | Freq: Once | INTRAMUSCULAR | Status: AC
  Administered 2018-05-02: 20 mg via INTRAVENOUS
  Filled 2018-05-02: qty 2

## 2018-05-02 MED ORDER — APIXABAN 5 MG PO TABS
5.0000 mg | ORAL_TABLET | Freq: Two times a day (BID) | ORAL | Status: DC
  Administered 2018-05-02 – 2018-05-03 (×4): 5 mg via ORAL
  Filled 2018-05-02 (×5): qty 1

## 2018-05-02 MED ORDER — LOSARTAN POTASSIUM 25 MG PO TABS
25.0000 mg | ORAL_TABLET | Freq: Every day | ORAL | Status: DC
  Administered 2018-05-03: 25 mg via ORAL
  Filled 2018-05-02 (×3): qty 1

## 2018-05-02 MED ORDER — FAT EMULSION PLANT BASED 20 % IV EMUL
250.0000 mL | INTRAVENOUS | Status: AC
  Administered 2018-05-02: 250 mL via INTRAVENOUS
  Filled 2018-05-02: qty 250

## 2018-05-02 MED ORDER — TRACE MINERALS CR-CU-MN-SE-ZN 10-1000-500-60 MCG/ML IV SOLN
INTRAVENOUS | Status: AC
  Administered 2018-05-02: 19:00:00 via INTRAVENOUS
  Filled 2018-05-02: qty 984

## 2018-05-02 MED ORDER — FUROSEMIDE 20 MG PO TABS
20.0000 mg | ORAL_TABLET | Freq: Two times a day (BID) | ORAL | Status: DC
  Administered 2018-05-02 – 2018-05-03 (×3): 20 mg via ORAL
  Filled 2018-05-02 (×4): qty 1

## 2018-05-02 MED ORDER — SODIUM CHLORIDE 0.9% FLUSH
10.0000 mL | Freq: Two times a day (BID) | INTRAVENOUS | Status: DC
  Administered 2018-05-02 – 2018-05-04 (×3): 10 mL

## 2018-05-02 MED ORDER — SODIUM CHLORIDE 0.9% FLUSH: 10.0000 mL | INTRAVENOUS | Status: DC | PRN

## 2018-05-02 NOTE — Progress Notes (Addendum)
Nutrition Follow Up Note    DOCUMENTATION CODES:   Non-severe (moderate) malnutrition in context of chronic illness  INTERVENTION:   Initiate Clinimix 5/15 with electrolytes at 36m/hr + 20% lipids '@20ml'$ /hr x 12 hrs/day (Goal rate 68mhr once labs stable)  Regimen at goal provides 1588kcal/day, 78g/day protein, 180064molume    Add MVI daily   Add trace elements daily    Pt at high refeeding risk; recommend check P, K, and Mg daily for at least 3 days after TPN iniatition.    Daily weights   NUTRITION DIAGNOSIS:   Moderate Malnutrition related to chronic illness(hx breast cancer, CHF, COPD) as evidenced by mild fat depletion, moderate fat depletion, mild muscle depletion, moderate muscle depletion.  GOAL:   Patient will meet greater than or equal to 90% of their needs  -not met   MONITOR:   PO intake, Supplement acceptance, Labs, Weight trends, I & O's  ASSESSMENT:   [age]ar old female with PMHx of arthritis, HTN, COPD, anxiety, depression, CHF, breast cancer s/p left mastectomy and chemotherapy/XRT, lymphedema who is now admitted with severe hypoxic and hypercapnic respiratory failure initially requiring BiPAP, CHF exacerbation, radiation pneumonitis/fibrosis.   Pt without adequate nutrition for > 7 days. Will initiate TPN today. Pt with ~20lb weight gain since admit; will monitor closely. Pt at high refeeding risk; monitor K, Mg and P labs.    Medications reviewed and include: celexa, folic acid, lasix, insulin, Mg oxide, MVI, thiamine  Labs reviewed: Na 132(L), K 4.1 wnl, Ca 7.8(L), Mg 1.3(L)-7/18 Hgb 8.6(L), Hct 25.0(L), MVC 102.4(H), MCH 35.0(H) cbgs- 100, 103, 104, 96, 104 x 24 hrs  Diet Order:   Diet Order           Diet clear liquid Room service appropriate? Yes; Fluid consistency: Thin  Diet effective now        Diet - low sodium heart healthy        Diet - low sodium heart healthy        Diet - low sodium heart healthy          EDUCATION NEEDS:    Education needs have been addressed  Skin:  Skin Assessment: Reviewed RN Assessment  Last BM:  7/17- type 7  Height:   Ht Readings from Last 1 Encounters:  04/22/18 '5\' 6"'$  (1.676 m)    Weight:   Wt Readings from Last 1 Encounters:  05/02/18 152 lb 12.8 oz (69.3 kg)    Ideal Body Weight:  59.1 kg  BMI:  Body mass index is 24.66 kg/m.  Estimated Nutritional Needs:   Kcal:  1505-1805 (25-30 kcal/kg)  Protein:  70-85 grams (1.2-1.4 grams/kg)  Fluid:  1.5-1.8 L/day (1 mL/kcal)  CasKoleen Distance, RD, LDN Pager #- [phone]#- [phone] Hours Pager: [phone]

## 2018-05-02 NOTE — Progress Notes (Signed)
Peripherally Inserted Central Catheter/Midline Placement  The IV Nurse has discussed with the patient and/or persons authorized to consent for the patient, the purpose of this procedure and the potential benefits and risks involved with this procedure.  The benefits include less needle sticks, lab draws from the catheter, and the patient may be discharged home with the catheter. Risks include, but not limited to, infection, bleeding, blood clot (thrombus formation), and puncture of an artery; nerve damage and irregular heartbeat and possibility to perform a PICC exchange if needed/ordered by physician.  Alternatives to this procedure were also discussed.  Bard Power PICC patient education guide, fact sheet on infection prevention and patient information card has been provided to patient /or left at bedside.    PICC/Midline Placement Documentation        Danielle [name] 05/02/2018, 1:36 PM

## 2018-05-02 NOTE — Plan of Care (Signed)
Medicated for complaints of abdominal pain once this shift.

## 2018-05-02 NOTE — Consult Note (Signed)
ANTICOAGULATION CONSULT NOTE - Initial Consult  Pharmacy Consult for apixaban Indication: atrial fibrillation  No Known Allergies  Patient Measurements: Height: 5\' 6"  (167.6 cm) Weight: 152 lb 12.8 oz (69.3 kg) IBW/kg (Calculated) : 59.3 Heparin Dosing Weight:   Vital Signs: Temp: 97.9 F (36.6 C) (07/19 0353) Temp Source: Oral (07/19 0353) BP: 144/93 (07/19 0353) Pulse Rate: 74 (07/19 0353)  Labs: Recent Labs    04/30/18 0137 04/30/18 2200 05/01/18 0510 05/01/18 1105 05/02/18 0422  HGB  --   --  8.0*  --  8.6*  HCT  --   --  24.1*  --  25.0*  PLT  --   --  288  --  298  APTT 98* 63* 80*  --   --   HEPARINUNFRC  --   --  0.35 0.38 0.23*  CREATININE 0.99  --  0.86  --   --     Estimated Creatinine Clearance: 61.9 mL/min (by C-G formula based on SCr of 0.86 mg/dL).   Medical History: Past Medical History:  Diagnosis Date  . Anxiety   . Arthritis   . Cancer ([place]) left    breast cancer 2000, chemo tx's with total mastectomy and lymph nodes resected.   . Cancer of right lung ([place]) 02/21/2016   rad tx's.   . CHF (congestive heart failure) ([place])   . COPD (chronic obstructive pulmonary disease) ([place])   . Dependence on supplemental oxygen   . Depression   . Diabetes mellitus without complication ([place])   . Heart murmur   . Hypertension   . Lung nodule   . Lymphedema   . Shortness of breath dyspnea    with exertion  . Status post chemotherapy 2001   left breast cancer  . Status post radiation therapy 2001   left breast cancer    Medications:  Scheduled:  . apixaban  5 mg Oral BID  . citalopram  20 mg Oral Daily  . feeding supplement  1 Container Oral TID BM  . folic acid  1 mg Oral Daily  . furosemide  20 mg Oral Daily  . insulin aspart  0-9 Units Subcutaneous TID WC  . ipratropium-albuterol  3 mL Nebulization TID  . magnesium oxide  400 mg Oral Daily  . metoprolol succinate  150 mg Oral Daily  . mometasone-formoterol  2 puff Inhalation BID  .  multivitamin with minerals  1 tablet Oral Daily  . sodium phosphate  1 enema Rectal Once  . thiamine  100 mg Oral Daily    Assessment: Patient is a 62 year old female with new onset afib this admission. Was started on apixaban but was transitioned to heparin drip due to need for procedures. Pharmacy is now consulted to transition patient back to apixaban.  Goal of Therapy:   Monitor platelets by anticoagulation protocol: Yes   Plan:  apixaban 5mg  BID. Turn heparin drip off at the same time first apixaban dose is given. CBC/Scr q 3 days while inpatient  Ramond Dial, Pharm.D, BCPS Clinical Pharmacist 05/02/2018,8:13 AM

## 2018-05-02 NOTE — Consult Note (Signed)
PHARMACY - ADULT TOTAL PARENTERAL NUTRITION CONSULT NOTE   Pharmacy Consult for TPN Indication: w/out adequate nutrition for > 7 days  Patient Measurements: Height: 5\' 6"  (167.6 cm) Weight: 152 lb 12.8 oz (69.3 kg) IBW/kg (Calculated) : 59.3 TPN AdjBW (KG): 64 Body mass index is 24.66 kg/m. Usual Weight:   Assessment:   GI: clear liquid diet Endo:  Insulin requirements in the past 24 hours: 0 Lytes:K=4.1, Mg=1.9, phos=3.1 Renal: scr 0.86 Pulm: Cards:  Hepatobil: Neuro: ID:  TPN Access: ordered 7/19 TPN start date: Nutritional Goals (per RD recommendation on 7/19): OLID:0301 Protein: 78 Fluid:1800  Goal TPN rate is 65 ml/hr  Current Nutrition:   Plan:  Clinimix E 5/15 @ 53ml/hr and 20% lipids @ 76ml/hr x 12 hr Add multivit and trace elements SSI sensitive q 4 hr for now, will decrease in little SSI is needed Daily weights, strict in and outs Monitor closely w/ CHF- potentially fluid overloaded Electrolytes WNL- no replacement needed Will check K, Mg, Phos daily x 3 days TG level tomorrow TG, LFT, albumin, CBC weekly Follow up tomorrow AM  Ramond Dial, Pharm.D, BCPS Clinical Pharmacist 05/02/2018,12:18 PM

## 2018-05-02 NOTE — Progress Notes (Signed)
05/02/2018  Subjective: Patient had more shortness of breath this morning but reports her RUQ abdominal pain is more of a soreness and rates it as 2 of 10 pain.  Had a CTA of abdomen and pelvis which showed patent SMA and IMA, and thus vascular surgery does not recommend any intervention for the area of ischemia noted at the hepatic flexure.  Path from biopsy during colonoscopy showed ulceration with features consistent with ischemia, but no necrosis.  Vital signs: Temp:  [97.4 F (36.3 C)-98.3 F (36.8 C)] 97.9 F (36.6 C) (07/19 0353) Pulse Rate:  [69-74] 74 (07/19 0353) Resp:  [18-20] 18 (07/19 0353) BP: (139-158)/(93-106) 144/93 (07/19 0353) SpO2:  [94 %-100 %] 100 % (07/19 0420) Weight:  [152 lb 12.8 oz (69.3 kg)] 152 lb 12.8 oz (69.3 kg) (07/19 0520)   Intake/Output: 07/18 0701 - 07/19 0700 In: 1542.3 [I.V.:1542.3] Out: 1400 [Urine:1400] Last BM Date: 04/30/18  Physical Exam: Constitutional: No acute distress Abdomen:  Soft, nondistended, with only mild soreness in the right upper quadrant, no peritonitis.  Labs:  Recent Labs    05/01/18 0510 05/02/18 0422  WBC 5.8 6.9  HGB 8.0* 8.6*  HCT 24.1* 25.0*  PLT 288 298   Recent Labs    04/30/18 0137 05/01/18 0510  NA 128* 132*  K 4.6 4.1  CL 97* 102  CO2 24 22  GLUCOSE 77 113*  BUN 22 16  CREATININE 0.99 0.86  CALCIUM 8.0* 7.8*   No results for input(s): LABPROT, INR in the last 72 hours.  Imaging: [place] Ekg Site Rite  Result Date: 05/02/2018 If Site Rite image not attached, placement could not be confirmed due to current cardiac rhythm.  Ct Angio Abd/pel W/ And/or W/o  Result Date: 05/01/2018 CLINICAL DATA:  Ischemia, abdomen pelvis. Pt states she has abdominal fluid, pain across lower abdomen several weeks. acute hypoxemic respiratory failure, acute COPD exacerbation, dehydration, hypokalemia, hypoglycemia, renal failure, hypocalcemia, hypoalbuminemia, transaminasemia/hyperbilirubinemia, BNP elevation,  Troponin-I elevation, macrocytic anemia. EXAM: CTA ABDOMEN AND PELVIS WITH CONTRAST TECHNIQUE: Multidetector CT imaging of the abdomen and pelvis was performed using the standard protocol during bolus administration of intravenous contrast. Multiplanar reconstructed images and MIPs were obtained and reviewed to evaluate the vascular anatomy. CONTRAST:  153mL ISOVUE-370 IOPAMIDOL (ISOVUE-370) INJECTION 76% COMPARISON:  04/27/2018 FINDINGS: VASCULAR Coronary calcifications.  Mild four-chamber cardiac enlargement. Aorta: Coarse calcified atheromatous plaque. No aneurysm, dissection, or high-grade stenosis. Celiac: Heavily calcified ostial plaque resulting in origin stenosis over length of less than 1 cm of possible hemodynamic significance, patent distally. SMA: Coarse calcified plaque extending over length of at least 7 cm, without high-grade stenosis. 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 This superior left renal artery has calcified ostial plaque resulting in short segment stenosis of at least mild severity. There is also ostial calcified plaque involving the inferior left renal artery without definite high-grade stenosis. Single right renal artery with calcified ostial plaque, no high-grade stenosis. IMA: Diminutive, patent Inflow: Eccentric calcified nonocclusive plaque through bilateral iliac arterial systems without high-grade stenosis or occlusion. No aneurysm. Proximal Outflow: Bilateral common femoral and visualized portions of the superficial and profunda femoral arteries are patent without evidence of aneurysm, dissection, vasculitis or significant stenosis. Veins: Patent hepatic veins, portal vein, SMV. Diminutive or occluded splenic vein. No significant gastric varices or retroperitoneal collateral venous channels. Patent bilateral renal veins, IVC and iliac venous system. Review of the MIP images confirms the above findings. NON-VASCULAR Lower chest: Bilateral pleural effusions right greater than  left. Dependent  atelectasis posteriorly in both lower lobes. Hepatobiliary: Diffuse fatty infiltration without focal lesion. Gallbladder is incompletely distended. No biliary ductal dilatation. Pancreas: Unremarkable. No pancreatic ductal dilatation or surrounding inflammatory changes. Spleen: Small.  No focal lesion. Adrenals/Urinary Tract: Normal adrenals. Normal renal parenchymal enhancement. No hydronephrosis. Urinary bladder incompletely distended. Stomach/Bowel: Stomach is incompletely distended. Small bowel is nondilated. Appendix not discretely identified. Scattered colonic diverticula. Suspected mild circumferential wall thickening in the distal ascending segment and a hepatic flexure extending into proximal transverse segment as before, without evidence of obstruction. Mild regional inflammatory/edematous changes. No abscess. Colon is decompressed distally. Lymphatic: No abdominal or pelvic adenopathy. Reproductive: Uterus and bilateral adnexa are unremarkable. Tubal ligation clips. Other: Trace pelvic ascites.  No free air. Musculoskeletal: Mild L4 superior endplate compression deformity as before. No acute fracture or worrisome bone lesion. IMPRESSION: VASCULAR 1. Extensive aortoiliac atherosclerosis (ICD10-170.0) without stenosis or aneurysm. 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 Patent superior and inferior mesenteric arteries likely render this clinically insignificant. 3. Superior left renal artery ostial stenosis of indeterminate hemodynamic significance. NON-VASCULAR 1. Persistent wall thickening in the colon centered at the hepatic flexure, without obstruction. 2. Progressive pleural effusions right greater than left 3. Fatty liver 4. Scattered colonic diverticula. Electronically Signed   By: Lucrezia Europe M.D.   On: 05/01/2018 14:43    Assessment/Plan: 62 yo female with ischemic colitis of the hepatic flexure, now improved.  Discussed with patient and reinforced plans by Dr. Rosana Hoes that given  her comorbidities, and the fact that her pain is much improved, remains with a normal WBC, that conservative management is the more prudent option rather than surgery.    Patient has been started on clear liquid diet today and there is also an order for TPN.  Will place order for PICC line as well.    Continue clear liquid diet and do not advance until her pain is resolved.   Melvyn Neth, [facility]

## 2018-05-02 NOTE — Progress Notes (Signed)
[facility] NAME: Danielle [name]    MR#:  [id number]  DATE OF BIRTH:  1956/08/14  SUBJECTIVE:  CHIEF COMPLAINT:   Chief Complaint  Patient presents with  . Shortness of Breath   The patient better RUQ abdominal pain, NPO for CT angiogram of her abdomen. REVIEW OF SYSTEMS:  Review of Systems  Constitutional: Positive for malaise/fatigue. Negative for chills and fever.  HENT: Negative for sore throat.   Eyes: Negative for blurred vision and double vision.  Respiratory: Positive for cough and shortness of breath. Negative for hemoptysis, sputum production, wheezing and stridor.   Cardiovascular: Negative for chest pain, palpitations, orthopnea and leg swelling.  Gastrointestinal: Positive for abdominal pain. Negative for blood in stool, constipation, diarrhea, melena, nausea and vomiting.  Genitourinary: Negative for dysuria, flank pain and hematuria.  Musculoskeletal: Negative for back pain and joint pain.  Skin: Negative for rash.  Neurological: Negative for dizziness, sensory change, focal weakness, seizures, loss of consciousness, weakness and headaches.  Endo/Heme/Allergies: Negative for polydipsia.  Psychiatric/Behavioral: Negative for depression. The patient is not nervous/anxious.     DRUG ALLERGIES:  No Known Allergies VITALS:  Blood pressure (!) 144/93, pulse 74, temperature 97.9 F (36.6 C), temperature source Oral, resp. rate 18, height 5\' 6"  (1.676 m), weight 152 lb 12.8 oz (69.3 kg), SpO2 100 %. PHYSICAL EXAMINATION:  Physical Exam  Constitutional: She is oriented to person, place, and time. She appears well-developed.  HENT:  Head: Normocephalic.  Mouth/Throat: Oropharynx is clear and moist.  Eyes: Pupils are equal, round, and reactive to light. Conjunctivae and EOM are normal. No scleral icterus.  Neck: Normal range of motion. Neck supple. JVD present. No tracheal deviation present.  Cardiovascular: Normal  rate and normal heart sounds. Exam reveals no gallop.  No murmur heard. Pulmonary/Chest: Effort normal. No stridor. No respiratory distress. She has no wheezes. She has rales.  Abdominal: Soft. Bowel sounds are normal. She exhibits no distension. There is no tenderness. There is no rebound.  Musculoskeletal: Normal range of motion. She exhibits edema. She exhibits no tenderness.  Bilateral leg edema  Neurological: She is alert and oriented to person, place, and time. No cranial nerve deficit.  Skin: No rash noted. No erythema.  Psychiatric: She has a normal mood and affect.   LABORATORY PANEL:  Female CBC Recent Labs  Lab 05/02/18 0422  WBC 6.9  HGB 8.6*  HCT 25.0*  PLT 298   ------------------------------------------------------------------------------------------------------------------ Chemistries  Recent Labs  Lab 05/01/18 0510 05/02/18 0422  NA 132*  --   K 4.1  --   CL 102  --   CO2 22  --   GLUCOSE 113*  --   BUN 16  --   CREATININE 0.86  --   CALCIUM 7.8*  --   MG 1.3* 1.9  AST  --  88*  ALT  --  127*  ALKPHOS  --  112  BILITOT  --  1.6*   RADIOLOGY:  [place] Ekg Site Rite  Result Date: 05/02/2018 If Site Rite image not attached, placement could not be confirmed due to current cardiac rhythm.  Ct Angio Abd/pel W/ And/or W/o  Result Date: 05/01/2018 CLINICAL DATA:  Ischemia, abdomen pelvis. Pt states she has abdominal fluid, pain across lower abdomen several weeks. acute hypoxemic respiratory failure, acute COPD exacerbation, dehydration, hypokalemia, hypoglycemia, renal failure, hypocalcemia, hypoalbuminemia, transaminasemia/hyperbilirubinemia, BNP elevation, Troponin-I elevation, macrocytic anemia. EXAM: CTA ABDOMEN AND PELVIS WITH CONTRAST TECHNIQUE: Multidetector  CT imaging of the abdomen and pelvis was performed using the standard protocol during bolus administration of intravenous contrast. Multiplanar reconstructed images and MIPs were obtained and reviewed to  evaluate the vascular anatomy. CONTRAST:  151mL ISOVUE-370 IOPAMIDOL (ISOVUE-370) INJECTION 76% COMPARISON:  04/27/2018 FINDINGS: VASCULAR Coronary calcifications.  Mild four-chamber cardiac enlargement. Aorta: Coarse calcified atheromatous plaque. No aneurysm, dissection, or high-grade stenosis. Celiac: Heavily calcified ostial plaque resulting in origin stenosis over length of less than 1 cm of possible hemodynamic significance, patent distally. SMA: Coarse calcified plaque extending over length of at least 7 cm, without high-grade stenosis. 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 This superior left renal artery has calcified ostial plaque resulting in short segment stenosis of at least mild severity. There is also ostial calcified plaque involving the inferior left renal artery without definite high-grade stenosis. Single right renal artery with calcified ostial plaque, no high-grade stenosis. IMA: Diminutive, patent Inflow: Eccentric calcified nonocclusive plaque through bilateral iliac arterial systems without high-grade stenosis or occlusion. No aneurysm. Proximal Outflow: Bilateral common femoral and visualized portions of the superficial and profunda femoral arteries are patent without evidence of aneurysm, dissection, vasculitis or significant stenosis. Veins: Patent hepatic veins, portal vein, SMV. Diminutive or occluded splenic vein. No significant gastric varices or retroperitoneal collateral venous channels. Patent bilateral renal veins, IVC and iliac venous system. Review of the MIP images confirms the above findings. NON-VASCULAR Lower chest: Bilateral pleural effusions right greater than left. Dependent atelectasis posteriorly in both lower lobes. Hepatobiliary: Diffuse fatty infiltration without focal lesion. Gallbladder is incompletely distended. No biliary ductal dilatation. Pancreas: Unremarkable. No pancreatic ductal dilatation or surrounding inflammatory changes. Spleen: Small.  No focal  lesion. Adrenals/Urinary Tract: Normal adrenals. Normal renal parenchymal enhancement. No hydronephrosis. Urinary bladder incompletely distended. Stomach/Bowel: Stomach is incompletely distended. Small bowel is nondilated. Appendix not discretely identified. Scattered colonic diverticula. Suspected mild circumferential wall thickening in the distal ascending segment and a hepatic flexure extending into proximal transverse segment as before, without evidence of obstruction. Mild regional inflammatory/edematous changes. No abscess. Colon is decompressed distally. Lymphatic: No abdominal or pelvic adenopathy. Reproductive: Uterus and bilateral adnexa are unremarkable. Tubal ligation clips. Other: Trace pelvic ascites.  No free air. Musculoskeletal: Mild L4 superior endplate compression deformity as before. No acute fracture or worrisome bone lesion. IMPRESSION: VASCULAR 1. Extensive aortoiliac atherosclerosis (ICD10-170.0) without stenosis or aneurysm. 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 Patent superior and inferior mesenteric arteries likely render this clinically insignificant. 3. Superior left renal artery ostial stenosis of indeterminate hemodynamic significance. NON-VASCULAR 1. Persistent wall thickening in the colon centered at the hepatic flexure, without obstruction. 2. Progressive pleural effusions right greater than left 3. Fatty liver 4. Scattered colonic diverticula. Electronically Signed   By: Lucrezia Europe M.D.   On: 05/01/2018 14:43   ASSESSMENT AND PLAN:   64F acute hypoxemic respiratory failure, acute COPD exacerbation, dehydration, hypokalemia, hypoglycemia, renal failure, hypocalcemia, hypoalbuminemia, transaminasemia/hyperbilirubinemia, BNP elevation, Troponin-I elevation, macrocytic anemia.  Acute on chronic respiratory failure with hypoxia and hypercapnia due to COPD exacerbation, radiation pneumonitis/fibrosis and CHF. The patient is off BiPAP, on oxygen by nasal cannula 2L (home  O2). Continue nebulizer and DuoNeb as needed, completed Zithromax, discontinued IV Solu-Medrol, changed to prednisone and completed.  New onset A. fib with RVR. The patient was treated with Cardizem IV without improvement. Cardizem drip was started. Consider anticoagulation for stroke prevention per Dr. Ubaldo Glassing. Echocardiogram showed ejection fraction 35 to 40%. Changed to p.o. Cardizem and started Eliquis per Dr. Ubaldo Glassing. Cardizem  was discontinued and Lopressor was started by Dr.Fath.  Change to Toprol XL 150 mg po daily. Hold Eliquis and continue heparin drip.[name] Per Dr. [name], may resume Eliquis.  Elevated troponin due to demanding ischemia.  Acute on chronic systolic CHF LV EF: 62% - 40%. Resumed Lasix.  Increase to twice daily.  Resume low-dose losartan.  Hypokalemia. She was given potassium supplement and improved. Hyperkalemia. Hold potassium supplement. She was gIven NovoLog, D50,  calcium gluconate IV and Lokalema, but the potassium was 5.4.   Given D50, NovoLog IV, and Lokelma.    Improved.  Acute renal failure due to ATN and dehydration. Improved with normal saline IV.   Hypomagnesemia and hypophosphatemia. Improved with supplement. Hypomagnesemia.  Mg 1.3. Given magnesium IV and improved.  Moderate Malnutritionrelated to chronic illness. Start TPN due to ischemic colitis.  Ischemic colitis. The patient was initially suspected due to constipation.  She still has abdominal pain after large bowel movement. Abdominal CAT scan: Focal area of irregular wall thickening and relative narrowing of the right colon at and just below the hepatic flexure extending over approximately 7-8 cm.  Pain control. Per Dr. [name], the patient can get colonoscopy, may stop Eliquis 1 to 2 days prior to endoscopy to lower bleeding potential and complication risk and restart as soon as possible thereafter due to patient maintaining normal sinus rhythm. Colonoscopy: The ulcerated mucosa  may be due to ischemia.  Sigmoid diverticulosis. Per Dr. Bonna Gains, Pt. will need surgical intervention as the ulcerated mucosa is circumferential and pt is symptomatic from it (RUQ abdominal pain). Would discuss with cardiology prior to resuming anticoagulation, as patient is at high risk of bleeding from the area given how ulcerated was.  If anticoagulation is resumed, would recommend monitoring for 1-2 days as an inpatient prior to discharge.    Would recommend CBCs every 12-24 hours while inpatient. I discussed with Dr. Bea Laura, who suggested resuming Eliquis.  Per Dr. Rosana Hoes, the patient has high risk for surgery for surgery at this time.  Clear liquid diet and TPN for several days. Continue clear liquid diet and do not advance until her pain is resolved.  CT angiogram shows patent IMA and SMA so large vessel disease is likely not the cause of the colitis.  As such, no role for revascularization or other vascular surgery intervention at this time per Dr. Lucky Cowboy.  Liver function test is unremarkable. Abdominal ultrasound shows severe hepatic steatosis.  Hyponatremia.    Potassium up to 132.  Continue D5NS IV due to n.p.o. status.   Stage I x2 lung cancer status post radiation;remote history of breast cancer.Follow-up of Dr. Rogue Bussing as outpatient.  Hypoglycemia.  Possible due to poor intake.  The patient was given D50, started D5 normal saline IV. Decreased to sensitive sliding scale.  Improved.  The patient need home health and PT.  Discussed with Dr. Rosana Hoes. All the records are reviewed and case discussed with Care Management/Social Worker. Management plans discussed with the patient, her daughter and they are in agreement.   CODE STATUS: Full Code  TOTAL TIME TAKING CARE OF THIS PATIENT: 43 minutes.   More than 50% of the time was spent in counseling/coordination of care: YES  POSSIBLE D/C IN >3 DAYS, DEPENDING ON CLINICAL CONDITION.   Demetrios Loll M.D on 05/02/2018 at  12:50 PM  Between 7am to 6pm - Pager - [phone]  After 6pm go to [email or web address] - [facility]

## 2018-05-02 NOTE — Progress Notes (Signed)
Danielle Antigua, MD [address] [place], [place], [place], [address] [address], [place], [place], [place], [address] Phone: [phone]  Fax: [phone]   Subjective: Patient reports her abdominal pain is improving.  Tolerating solid food diet without nausea or vomiting.   Objective: Exam: Vital signs in last 24 hours: Vitals:   05/02/18 0353 05/02/18 0405 05/02/18 0420 05/02/18 0520  BP: (!) 144/93     Pulse: 74     Resp: 18     Temp: 97.9 F (36.6 C)     TempSrc: Oral     SpO2: 94% 95% 100%   Weight:    152 lb 12.8 oz (69.3 kg)  Height:       Weight change: -1 lb (-0.454 kg)  Intake/Output Summary (Last 24 hours) at 05/02/2018 1431 Last data filed at 05/02/2018 1145 Gross per 24 hour  Intake 1277.28 ml  Output 1350 ml  Net -72.72 ml    General: No acute distress, AAO x3 Abd: Soft, NT/ND, No HSM Skin: Warm, no rashes Neck: Supple, Trachea midline   Lab Results: Lab Results  Component Value Date   WBC 6.9 05/02/2018   HGB 8.6 (L) 05/02/2018   HCT 25.0 (L) 05/02/2018   MCV 102.4 (H) 05/02/2018   PLT 298 05/02/2018   Micro Results: Recent Results (from the past 240 hour(s))  C difficile quick scan w PCR reflex     Status: None   Collection Time: 04/27/18  2:36 PM  Result Value Ref Range Status   C Diff antigen NEGATIVE NEGATIVE Final   C Diff toxin NEGATIVE NEGATIVE Final   C Diff interpretation No C. difficile detected.  Final    Comment: Performed at [facility], [address] [place], [place] [address]   Studies/Results: Dg Chest Port 1 View  Result Date: 05/02/2018 CLINICAL DATA:  SOB onset.  Denies chest pain. EXAM: PORTABLE CHEST 1 VIEW COMPARISON:  04/21/2018. FINDINGS: Prominent RIGHT hilar region is redemonstrated. RIGHT upper lobe scarring/subsegmental atelectasis may be slightly more prominent. Cardiomegaly. Emphysematous change. BILATERAL pleural effusions. IMPRESSION: Slight increased prominence of subsegmental  atelectasis or scarring RIGHT upper lobe, but no evidence of increased size RIGHT hilar mass. Electronically Signed   By: Staci Righter M.D.   On: 05/02/2018 12:48   [place] Ekg Site Rite  Result Date: 05/02/2018 If Site Rite image not attached, placement could not be confirmed due to current cardiac rhythm.  Ct Angio Abd/pel W/ And/or W/o  Result Date: 05/01/2018 CLINICAL DATA:  Ischemia, abdomen pelvis. Pt states she has abdominal fluid, pain across lower abdomen several weeks. acute hypoxemic respiratory failure, acute COPD exacerbation, dehydration, hypokalemia, hypoglycemia, renal failure, hypocalcemia, hypoalbuminemia, transaminasemia/hyperbilirubinemia, BNP elevation, Troponin-I elevation, macrocytic anemia. EXAM: CTA ABDOMEN AND PELVIS WITH CONTRAST TECHNIQUE: Multidetector CT imaging of the abdomen and pelvis was performed using the standard protocol during bolus administration of intravenous contrast. Multiplanar reconstructed images and MIPs were obtained and reviewed to evaluate the vascular anatomy. CONTRAST:  110mL ISOVUE-370 IOPAMIDOL (ISOVUE-370) INJECTION 76% COMPARISON:  04/27/2018 FINDINGS: VASCULAR Coronary calcifications.  Mild four-chamber cardiac enlargement. Aorta: Coarse calcified atheromatous plaque. No aneurysm, dissection, or high-grade stenosis. Celiac: Heavily calcified ostial plaque resulting in origin stenosis over length of less than 1 cm of possible hemodynamic significance, patent distally. SMA: Coarse calcified plaque extending over length of at least 7 cm, without high-grade stenosis. 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 This superior left renal artery has calcified ostial plaque resulting in short segment stenosis of at  least mild severity. There is also ostial calcified plaque involving the inferior left renal artery without definite high-grade stenosis. Single right renal artery with calcified ostial plaque, no high-grade stenosis. IMA: Diminutive, patent Inflow:  Eccentric calcified nonocclusive plaque through bilateral iliac arterial systems without high-grade stenosis or occlusion. No aneurysm. Proximal Outflow: Bilateral common femoral and visualized portions of the superficial and profunda femoral arteries are patent without evidence of aneurysm, dissection, vasculitis or significant stenosis. Veins: Patent hepatic veins, portal vein, SMV. Diminutive or occluded splenic vein. No significant gastric varices or retroperitoneal collateral venous channels. Patent bilateral renal veins, IVC and iliac venous system. Review of the MIP images confirms the above findings. NON-VASCULAR Lower chest: Bilateral pleural effusions right greater than left. Dependent atelectasis posteriorly in both lower lobes. Hepatobiliary: Diffuse fatty infiltration without focal lesion. Gallbladder is incompletely distended. No biliary ductal dilatation. Pancreas: Unremarkable. No pancreatic ductal dilatation or surrounding inflammatory changes. Spleen: Small.  No focal lesion. Adrenals/Urinary Tract: Normal adrenals. Normal renal parenchymal enhancement. No hydronephrosis. Urinary bladder incompletely distended. Stomach/Bowel: Stomach is incompletely distended. Small bowel is nondilated. Appendix not discretely identified. Scattered colonic diverticula. Suspected mild circumferential wall thickening in the distal ascending segment and a hepatic flexure extending into proximal transverse segment as before, without evidence of obstruction. Mild regional inflammatory/edematous changes. No abscess. Colon is decompressed distally. Lymphatic: No abdominal or pelvic adenopathy. Reproductive: Uterus and bilateral adnexa are unremarkable. Tubal ligation clips. Other: Trace pelvic ascites.  No free air. Musculoskeletal: Mild L4 superior endplate compression deformity as before. No acute fracture or worrisome bone lesion. IMPRESSION: VASCULAR 1. Extensive aortoiliac atherosclerosis (ICD10-170.0) without  stenosis or aneurysm. 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 Patent superior and inferior mesenteric arteries likely render this clinically insignificant. 3. Superior left renal artery ostial stenosis of indeterminate hemodynamic significance. NON-VASCULAR 1. Persistent wall thickening in the colon centered at the hepatic flexure, without obstruction. 2. Progressive pleural effusions right greater than left 3. Fatty liver 4. Scattered colonic diverticula. Electronically Signed   By: Lucrezia Europe M.D.   On: 05/01/2018 14:43   Medications:  Scheduled Meds: . apixaban  5 mg Oral BID  . citalopram  20 mg Oral Daily  . feeding supplement  1 Container Oral TID BM  . folic acid  1 mg Oral Daily  . furosemide  20 mg Oral BID  . insulin aspart  0-9 Units Subcutaneous TID WC  . insulin aspart  0-9 Units Subcutaneous Q4H  . ipratropium-albuterol  3 mL Nebulization TID  . losartan  25 mg Oral Daily  . magnesium oxide  400 mg Oral Daily  . metoprolol succinate  150 mg Oral Daily  . mometasone-formoterol  2 puff Inhalation BID  . sodium chloride flush  10-40 mL Intracatheter Q12H  . sodium phosphate  1 enema Rectal Once  . thiamine  100 mg Oral Daily   Continuous Infusions: . Marland KitchenTPN (CLINIMIX-E) Adult    . Fat emulsion     PRN Meds:.acetaminophen **OR** acetaminophen, bisacodyl, bisacodyl, dextrose, HYDROcodone-acetaminophen, ipratropium-albuterol, morphine injection, ondansetron **OR** ondansetron (ZOFRAN) IV, senna-docusate, sodium chloride flush   Assessment: Active Problems:   Malignant neoplasm of upper lobe of right lung (HCC)   Acute hypoxemic respiratory failure (HCC)   Malnutrition of moderate degree   Palliative care by specialist   DNR (do not resuscitate) discussion   Weakness generalized   COPD exacerbation (HCC)   Generalized abdominal pain   Colonic thickening   Ulceration of intestine   Abdominal pain, right upper quadrant  Diverticulosis of large intestine without  diverticulitis   Abnormal CT scan, colon    Plan: Circumferential ulcerated area seen in the hepatic flexure on colonoscopy Vascular surgery following, and ordered CTA which showed origin stenosis of the celiac axis, but patent superior and inferior mesenteric arteries. General surgery following as well, but deem patient high risk for surgery at this time and recommend conservative management Eliquis has been restarted No evidence of GI bleeding at this time Continue close monitoring CBC every 12-24 hours Official pathology report reviewed, and did not show any evidence of malignancy.  Suggests ischemia   LOS: 11 days   Danielle Antigua, MD 05/02/2018, 2:31 PM

## 2018-05-02 NOTE — Progress Notes (Signed)
MD notified. Pt is short of breath at rest, requesting prn breathing treatment. Lungs show crackles to ascultation, Oxygen saturation is 95% on 2L via [place]. IVF stopped and MD notified for further orders. I will continue to assess.

## 2018-05-02 NOTE — Progress Notes (Signed)
[place] for heparin Indication: atrial fibrillation  No Known Allergies  Patient Measurements: Height: 5\' 6"  (167.6 cm) Weight: 153 lb 12.8 oz (69.8 kg) IBW/kg (Calculated) : 59.3 Heparin Dosing Weight: 66.3 kg   Vital Signs: Temp: 97.9 F (36.6 C) (07/19 0353) Temp Source: Oral (07/19 0353) BP: 144/93 (07/19 0353) Pulse Rate: 74 (07/19 0353)  Labs: Recent Labs    04/29/18 0605  04/30/18 0137 04/30/18 2200 05/01/18 0510 05/01/18 1105 05/02/18 0422  HGB 8.4*  --   --   --  8.0*  --  8.6*  HCT 24.6*  --   --   --  24.1*  --  25.0*  PLT 267  --   --   --  288  --  298  APTT 137*   < > 98* 63* 80*  --   --   HEPARINUNFRC 2.28*   < >  --   --  0.35 0.38 0.23*  CREATININE  --   --  0.99  --  0.86  --   --    < > = values in this interval not displayed.    Estimated Creatinine Clearance: 61.9 mL/min (by C-G formula based on SCr of 0.86 mg/dL).   Medical History: Past Medical History:  Diagnosis Date  . Anxiety   . Arthritis   . Cancer ([place]) left    breast cancer 2000, chemo tx's with total mastectomy and lymph nodes resected.   . Cancer of right lung ([place]) 02/21/2016   rad tx's.   . CHF (congestive heart failure) ([place])   . COPD (chronic obstructive pulmonary disease) ([place])   . Dependence on supplemental oxygen   . Depression   . Diabetes mellitus without complication ([place])   . Heart murmur   . Hypertension   . Lung nodule   . Lymphedema   . Shortness of breath dyspnea    with exertion  . Status post chemotherapy 2001   left breast cancer  . Status post radiation therapy 2001   left breast cancer    Medications:  Patient was started on apixaban 5mg  BID while inpatient for new onset afib. Last dose of apixaban 5mg  was 7/14 2049.   Assessment: 62 yo female admitted with SOB, cough, and abdominal pain found to have thickening of ascending colon. Patient diagnosed with afib and started on apixaban and cardizem. GI  consult obtained and pending cardiology clear, patient will undergo colonoscopy. Apixaban now on hold pending colonoscopy. Pharmacy consulted to dose and monitor heparin.   Goal of Therapy:  Heparin level 0.3-0.7 units/ml Monitor platelets by anticoagulation protocol: Yes   Plan:  07/19 @ 0500 HL 0.23 subtherapeutic. Will increase rate to 850 units/hr and will recheck anti-Xa @ 1100. Hgb back up slightly to 8.6 from 8.0, will continue to monitor.  Tobie Lords, PharmD, BCPS Clinical Pharmacist /05/02/2018

## 2018-05-03 LAB — COMPREHENSIVE METABOLIC PANEL
ALT: 110 U/L — ABNORMAL HIGH (ref 0–44)
ANION GAP: 8 (ref 5–15)
AST: 72 U/L — AB (ref 15–41)
Albumin: 3.1 g/dL — ABNORMAL LOW (ref 3.5–5.0)
Alkaline Phosphatase: 97 U/L (ref 38–126)
BILIRUBIN TOTAL: 1.3 mg/dL — AB (ref 0.3–1.2)
BUN: 13 mg/dL (ref 8–23)
CO2: 24 mmol/L (ref 22–32)
Calcium: 8.4 mg/dL — ABNORMAL LOW (ref 8.9–10.3)
Chloride: 97 mmol/L — ABNORMAL LOW (ref 98–111)
Creatinine, Ser: 0.92 mg/dL (ref 0.44–1.00)
Glucose, Bld: 116 mg/dL — ABNORMAL HIGH (ref 70–99)
POTASSIUM: 3.4 mmol/L — AB (ref 3.5–5.1)
Sodium: 129 mmol/L — ABNORMAL LOW (ref 135–145)
TOTAL PROTEIN: 6.1 g/dL — AB (ref 6.5–8.1)

## 2018-05-03 LAB — GLUCOSE, CAPILLARY
GLUCOSE-CAPILLARY: 110 mg/dL — AB (ref 70–99)
GLUCOSE-CAPILLARY: 127 mg/dL — AB (ref 70–99)
GLUCOSE-CAPILLARY: 145 mg/dL — AB (ref 70–99)
Glucose-Capillary: 127 mg/dL — ABNORMAL HIGH (ref 70–99)
Glucose-Capillary: 124 mg/dL — ABNORMAL HIGH (ref 70–99)
Glucose-Capillary: 142 mg/dL — ABNORMAL HIGH (ref 70–99)

## 2018-05-03 LAB — MAGNESIUM: MAGNESIUM: 1.5 mg/dL — AB (ref 1.7–2.4)

## 2018-05-03 LAB — TRIGLYCERIDES: TRIGLYCERIDES: 143 mg/dL (ref <150)

## 2018-05-03 LAB — PHOSPHORUS: Phosphorus: 2.7 mg/dL (ref 2.5–4.6)

## 2018-05-03 MED ORDER — FAT EMULSION PLANT BASED 20 % IV EMUL
250.0000 mL | INTRAVENOUS | Status: AC
  Administered 2018-05-03: 250 mL via INTRAVENOUS
  Filled 2018-05-03: qty 250

## 2018-05-03 MED ORDER — M.V.I. ADULT IV INJ
INJECTION | INTRAVENOUS | Status: AC
  Administered 2018-05-03: 17:00:00 via INTRAVENOUS
  Filled 2018-05-03 (×2): qty 984

## 2018-05-03 MED ORDER — MAGNESIUM SULFATE 2 GM/50ML IV SOLN
2.0000 g | Freq: Once | INTRAVENOUS | Status: AC
  Administered 2018-05-03: 2 g via INTRAVENOUS
  Filled 2018-05-03: qty 50

## 2018-05-03 MED ORDER — POTASSIUM CHLORIDE 20 MEQ/15ML (10%) PO SOLN
20.0000 meq | Freq: Once | ORAL | Status: AC
  Administered 2018-05-03: 20 meq via ORAL
  Filled 2018-05-03 (×2): qty 15

## 2018-05-03 NOTE — Progress Notes (Signed)
[facility] NAME: Danielle [name]    MR#:  [id number]  DATE OF BIRTH:  [birth year]  SUBJECTIVE: Decreased abdominal pain, shortness of breath is better today.  CHIEF COMPLAINT:   Chief Complaint  Patient presents with  . Shortness of Breath  Some cough but unable to get the phlegm out .  REVIEW OF SYSTEMS:  Review of Systems  Constitutional: Positive for malaise/fatigue. Negative for chills and fever.  HENT: Negative for sore throat.   Eyes: Negative for blurred vision and double vision.  Respiratory: Positive for cough and shortness of breath. Negative for hemoptysis, sputum production, wheezing and stridor.   Cardiovascular: Negative for chest pain, palpitations, orthopnea and leg swelling.  Gastrointestinal: Positive for abdominal pain. Negative for blood in stool, constipation, diarrhea, melena, nausea and vomiting.  Genitourinary: Negative for dysuria, flank pain and hematuria.  Musculoskeletal: Negative for back pain and joint pain.  Skin: Negative for rash.  Neurological: Negative for dizziness, sensory change, focal weakness, seizures, loss of consciousness, weakness and headaches.  Endo/Heme/Allergies: Negative for polydipsia.  Psychiatric/Behavioral: Negative for depression. The patient is not nervous/anxious.     DRUG ALLERGIES:  No Known Allergies VITALS:  Blood pressure (!) 159/90, pulse 74, temperature 98.3 F (36.8 C), temperature source Oral, resp. rate 16, height 5\' 6"  (1.676 m), weight 69.8 kg (153 lb 14.4 oz), SpO2 97 %. PHYSICAL EXAMINATION:  Physical Exam  Constitutional: She is oriented to person, place, and time. She appears well-developed.  HENT:  Head: Normocephalic.  Mouth/Throat: Oropharynx is clear and moist.  Eyes: Pupils are equal, round, and reactive to light. Conjunctivae and EOM are normal. No scleral icterus.  Neck: Normal range of motion. Neck supple. JVD present. No tracheal deviation  present.  Cardiovascular: Normal rate and normal heart sounds. Exam reveals no gallop.  No murmur heard. Pulmonary/Chest: Effort normal. No stridor. No respiratory distress. She has no wheezes. She has rales.  Abdominal: Soft. Bowel sounds are normal. She exhibits no distension. There is no tenderness. There is no rebound.  Musculoskeletal: Normal range of motion. She exhibits edema. She exhibits no tenderness.  Bilateral leg edema  Neurological: She is alert and oriented to person, place, and time. No cranial nerve deficit.  Skin: No rash noted. No erythema.  Psychiatric: She has a normal mood and affect.   LABORATORY PANEL:  Female CBC Recent Labs  Lab 05/02/18 0422  WBC 6.9  HGB 8.6*  HCT 25.0*  PLT 298   ------------------------------------------------------------------------------------------------------------------ Chemistries  Recent Labs  Lab 05/03/18 0635  NA 129*  K 3.4*  CL 97*  CO2 24  GLUCOSE 116*  BUN 13  CREATININE 0.92  CALCIUM 8.4*  MG 1.5*  AST 72*  ALT 110*  ALKPHOS 97  BILITOT 1.3*   RADIOLOGY:  No results found. ASSESSMENT AND PLAN:   6F acute hypoxemic respiratory failure, acute COPD exacerbation, dehydration, hypokalemia, hypoglycemia, renal failure, hypocalcemia, hypoalbuminemia, transaminasemia/hyperbilirubinemia, BNP elevation, Troponin-I elevation, macrocytic anemia.  Acute on chronic respiratory failure with hypoxia and hypercapnia due to COPD exacerbation, radiation pneumonitis/fibrosis and CHF. The patient is off BiPAP, on oxygen by nasal cannula 2L (home O2). Continue nebulizer and DuoNeb as needed, completed Zithromax, discontinued IV Solu-Medrol, changed to prednisone and completed. continue bronchodilators, oxygen.  New onset A. fib with RVR. The patient was treated with Cardizem IV without improvement. Cardizem drip was started. Consider anticoagulation for stroke prevention per Dr. Ubaldo Glassing. Echocardiogram showed ejection  fraction 35 to 40%.  Changed to p.o. Cardizem and started Eliquis per Dr. Ubaldo Glassing. Cardizem was discontinued and Lopressor was started by Dr.Fath.  Change to Toprol XL 150 mg po daily. Restarted the Eliquis.  Elevated troponin due to demanding ischemia.  Acute on chronic systolic CHF LV EF: 59% - 40%. Resumed Lasix.  Increase to twice daily.  Resume low-dose losartan.  Hypokalemia. She was given potassium supplement and improved. Hyperkalemia. Hold potassium supplement. She was gIven NovoLog, D50,  calcium gluconate IV and Lokalema, but the potassium was 5.4.   Given D50, NovoLog IV, and Lokelma.    Improved.  Acute renal failure due to ATN and dehydration. Improved with normal saline IV.   Hypomagnesemia and hypophosphatemia. Improved with supplement. Hypomagnesemia.  Mg 1.3. Given magnesium IV and improved.  Moderate Malnutritionrelated to chronic illness. Start TPN due to ischemic colitis.  Ischemic colitis. The patient was initially suspected due to constipation.  She still has abdominal pain after large bowel movement. Abdominal CAT scan: Focal area of irregular wall thickening and relative narrowing of the right colon at and just below the hepatic flexure extending over approximately 7-8 cm.  Pain control. Per Dr. [name], the patient can get colonoscopy, may stop Eliquis 1 to 2 days prior to endoscopy to lower bleeding potential and complication risk and restart as soon as possible thereafter due to patient maintaining normal sinus rhythm. Colonoscopy: The ulcerated mucosa may be due to ischemia.  Sigmoid diverticulosis. Per Dr. Bonna Gains, Pt. will need surgical intervention as the ulcerated mucosa is circumferential and pt is symptomatic from it (RUQ abdominal pain). Would discuss with cardiology prior to resuming anticoagulation, as patient is at high risk of bleeding from the area given how ulcerated was.  If anticoagulation is resumed, would recommend monitoring for  1-2 days as an inpatient prior to discharge.    Would recommend CBCs every 12-24 hours while inpatient. I discussed with Dr. Bea Laura, who suggested resuming Eliquis.  Per Dr. Rosana Hoes, the patient has high risk for surgery for surgery at this time.  Clear liquid diet and TPN for several days. Continue clear liquid diet and do not advance until her pain is resolved.  CT angiogram shows patent IMA and SMA so large vessel disease is likely not the cause of the colitis.  As such, no role for revascularization or other vascular surgery intervention at this time per Dr. Lucky Cowboy.  Liver function test is unremarkable. Abdominal ultrasound shows severe hepatic steatosis.  Hyponatremia.    Potassium up to 132.  Continue D5NS IV due to n.p.o. status.   Stage I x2 lung cancer status post radiation;remote history of breast cancer.Follow-up of Dr. Rogue Bussing as outpatient.  Hypoglycemia.  Possible due to poor intake.  The patient was given D50, started D5 normal saline IV. Decreased to sensitive sliding scale.  Improved.  The patient need home health and PT.   All the records are reviewed and case discussed with Care Management/Social Worker. Management plans discussed with the patient, her daughter and they are in agreement.   CODE STATUS: Full Code  TOTAL TIME TAKING CARE OF THIS PATIENT: 43 minutes.   More than 50% of the time was spent in counseling/coordination of care: YES  POSSIBLE D/C IN >3 DAYS, DEPENDING ON CLINICAL CONDITION.   Epifanio Lesches M.D on 05/03/2018 at 12:50 PM  Between 7am to 6pm - Pager - [phone]  After 6pm go to [email or web address] - [facility]

## 2018-05-03 NOTE — Progress Notes (Signed)
Patient ID: Danielle [name], female   DOB: 07-Mar-1956, 62 y.o.   MRN: [id number]     [facility] Hospital Day(s): 12.   Post op day(s): 3 Days Post-Op.   Interval History: Patient seen and examined, no acute events or new complaints overnight. Patient but continue to complain of shortness of breath upon sitting down and trying to get out of bed. Refers abdominal pain is minimal.    Vital signs in last 24 hours: [min-max] current  Temp:  [97.8 F (36.6 C)-98.3 F (36.8 C)] 98.3 F (36.8 C) (07/20 0434) Pulse Rate:  [67-74] 74 (07/20 0747) Resp:  [16-18] 16 (07/20 0747) BP: (148-159)/(90-107) 159/90 (07/20 0747) SpO2:  [96 %-100 %] 97 % (07/20 0750) Weight:  [69.8 kg (153 lb 14.4 oz)] 69.8 kg (153 lb 14.4 oz) (07/20 0232)     Height: 5\' 6"  (167.6 cm) Weight: 69.8 kg (153 lb 14.4 oz) BMI (Calculated): 24.85    Physical Exam:  Constitutional: alert, cooperative and no distress  Gastrointestinal: soft, mild-tender, and non-distended  Labs:  CBC Latest Ref Rng & Units 05/02/2018 05/01/2018 04/29/2018  WBC 3.6 - 11.0 K/uL 6.9 5.8 5.0  Hemoglobin 12.0 - 16.0 g/dL 8.6(L) 8.0(L) 8.4(L)  Hematocrit 35.0 - 47.0 % 25.0(L) 24.1(L) 24.6(L)  Platelets 150 - 440 K/uL 298 288 267   CMP Latest Ref Rng & Units 05/03/2018 05/02/2018 05/01/2018  Glucose 70 - 99 mg/dL 116(H) 99 113(H)  BUN 8 - 23 mg/dL 13 13 16   Creatinine 0.44 - 1.00 mg/dL 0.92 0.78 0.86  Sodium 135 - 145 mmol/L 129(L) 127(L) 132(L)  Potassium 3.5 - 5.1 mmol/L 3.4(L) 4.3 4.1  Chloride 98 - 111 mmol/L 97(L) 99 102  CO2 22 - 32 mmol/L 24 19(L) 22  Calcium 8.9 - 10.3 mg/dL 8.4(L) 8.1(L) 7.8(L)  Total Protein 6.5 - 8.1 g/dL 6.1(L) 6.3(L) -  Total Bilirubin 0.3 - 1.2 mg/dL 1.3(H) 1.6(H) -  Alkaline Phos 38 - 126 U/L 97 112 -  AST 15 - 41 U/L 72(H) 88(H) -  ALT 0 - 44 U/L 110(H) 127(H) -    Assessment/Plan:  62 y.o. female with ischemic colitis of the hepatic flexure.  Patient most concern is her shortness of breath  which is not related to the GI problem. Pain on abdomen is minimal. Will continue with clear liquid diet and TPN as recommended by Dr. Rosana Hoes until abdominal pain is resolved. No leukocytosis. No indication for surgery at this moment. Will continue to follow.   Arnold Long, MD

## 2018-05-03 NOTE — Consult Note (Signed)
PHARMACY - ADULT TOTAL PARENTERAL NUTRITION CONSULT NOTE   Pharmacy Consult for TPN Indication: w/out adequate nutrition for > 7 days  Patient Measurements: Height: 5\' 6"  (167.6 cm) Weight: 153 lb 14.4 oz (69.8 kg) IBW/kg (Calculated) : 59.3 TPN AdjBW (KG): 64 Body mass index is 24.84 kg/m. Usual Weight:   Assessment:   GI: clear liquid diet Endo:  Insulin requirements in the past 24 hours: 7 units Lytes:K=3.4, Mg=1.5, phos=2.7 Renal: scr 0.92 Pulm: Cards:  Hepatobil: Neuro: ID:  TPN Access: ordered 7/19 TPN start date: Nutritional Goals (per RD recommendation on 7/19): [id number] Protein: 29 Fluid:1800  Goal TPN rate is 65 ml/hr  Current Nutrition:   Plan:  Clinimix E 5/15 @ 82ml/hr and 20% lipids @ 11ml/hr x 12 hr Add multivit and trace elements SSI sensitive q 4 hr for now, will decrease in little SSI is needed Daily weights, strict in and outs Monitor closely w/ CHF- potentially fluid overloaded Ordered KCL 46meq PO x 1 and mag 2 gm IV x 1. Will check K, Mg, Phos daily x 3 days TG, LFT, albumin, CBC weekly Follow up tomorrow AM  Olivia Canter, [facility] Clinical Pharmacist 05/03/2018,11:15 AM

## 2018-05-04 ENCOUNTER — Encounter
Admission: EM | Disposition: A | Discharge status: Home / Self Care | Payer: Self-pay | Source: Home / Self Care | Attending: Internal Medicine

## 2018-05-04 ENCOUNTER — Inpatient Hospital Stay: Payer: Medicaid Other | Admitting: Certified Registered"

## 2018-05-04 ENCOUNTER — Inpatient Hospital Stay: Payer: Medicaid Other

## 2018-05-04 DIAGNOSIS — R578 Other shock: Secondary | ICD-10-CM

## 2018-05-04 DIAGNOSIS — R571 Hypovolemic shock: Secondary | ICD-10-CM

## 2018-05-04 DIAGNOSIS — K55031 Focal (segmental) acute (reversible) ischemia of large intestine: Secondary | ICD-10-CM

## 2018-05-04 DIAGNOSIS — D62 Acute posthemorrhagic anemia: Secondary | ICD-10-CM

## 2018-05-04 DIAGNOSIS — K922 Gastrointestinal hemorrhage, unspecified: Secondary | ICD-10-CM

## 2018-05-04 HISTORY — PX: LAPAROTOMY: SHX154

## 2018-05-04 LAB — COMPREHENSIVE METABOLIC PANEL
ALT: 70 U/L — ABNORMAL HIGH (ref 0–44)
AST: 57 U/L — ABNORMAL HIGH (ref 15–41)
Albumin: 2.3 g/dL — ABNORMAL LOW (ref 3.5–5.0)
Alkaline Phosphatase: 73 U/L (ref 38–126)
Anion gap: 8 (ref 5–15)
BUN: 21 mg/dL (ref 8–23)
CO2: 21 mmol/L — ABNORMAL LOW (ref 22–32)
CREATININE: 1.01 mg/dL — AB (ref 0.44–1.00)
Calcium: 7.1 mg/dL — ABNORMAL LOW (ref 8.9–10.3)
Chloride: 101 mmol/L (ref 98–111)
GFR, EST NON AFRICAN AMERICAN: 58 mL/min — AB (ref >60)
Glucose, Bld: 151 mg/dL — ABNORMAL HIGH (ref 70–99)
Potassium: 3.6 mmol/L (ref 3.5–5.1)
Sodium: 130 mmol/L — ABNORMAL LOW (ref 135–145)
TOTAL PROTEIN: 4.8 g/dL — AB (ref 6.5–8.1)
Total Bilirubin: 1.4 mg/dL — ABNORMAL HIGH (ref 0.3–1.2)

## 2018-05-04 LAB — CBC
HCT: 27.2 % — ABNORMAL LOW (ref 35.0–47.0)
Hemoglobin: 9 g/dL — ABNORMAL LOW (ref 12.0–16.0)
MCH: 30.5 pg (ref 26.0–34.0)
MCHC: 33.1 g/dL (ref 32.0–36.0)
MCV: 92 fL (ref 80.0–100.0)
Platelets: 200 10*3/uL (ref 150–440)
RBC: 2.96 MIL/uL — ABNORMAL LOW (ref 3.80–5.20)
RDW: 21.2 % — AB (ref 11.5–14.5)
WBC: 30.8 10*3/uL — AB (ref 3.6–11.0)

## 2018-05-04 LAB — PROCALCITONIN: PROCALCITONIN: 0.36 ng/mL

## 2018-05-04 LAB — GLUCOSE, CAPILLARY
GLUCOSE-CAPILLARY: 134 mg/dL — AB (ref 70–99)
GLUCOSE-CAPILLARY: 81 mg/dL (ref 70–99)
GLUCOSE-CAPILLARY: 84 mg/dL (ref 70–99)
Glucose-Capillary: 117 mg/dL — ABNORMAL HIGH (ref 70–99)
Glucose-Capillary: 121 mg/dL — ABNORMAL HIGH (ref 70–99)
Glucose-Capillary: 133 mg/dL — ABNORMAL HIGH (ref 70–99)

## 2018-05-04 LAB — BLOOD GAS, ARTERIAL
Acid-base deficit: 8.9 mmol/L — ABNORMAL HIGH (ref 0.0–2.0)
BICARBONATE: 17.5 mmol/L — AB (ref 20.0–28.0)
FIO2: 0.5
LHR: 16 {breaths}/min
MECHVT: 450 mL
O2 SAT: 87.3 %
PATIENT TEMPERATURE: 37
PEEP/CPAP: 5 cmH2O
pCO2 arterial: 39 mmHg (ref 32.0–48.0)
pH, Arterial: 7.26 — ABNORMAL LOW (ref 7.350–7.450)
pO2, Arterial: 62 mmHg — ABNORMAL LOW (ref 83.0–108.0)

## 2018-05-04 LAB — BASIC METABOLIC PANEL
Anion gap: 6 (ref 5–15)
BUN: 21 mg/dL (ref 8–23)
CHLORIDE: 95 mmol/L — AB (ref 98–111)
CO2: 25 mmol/L (ref 22–32)
CREATININE: 0.98 mg/dL (ref 0.44–1.00)
Calcium: 7.5 mg/dL — ABNORMAL LOW (ref 8.9–10.3)
GFR, EST NON AFRICAN AMERICAN: 60 mL/min — AB (ref >60)
Glucose, Bld: 123 mg/dL — ABNORMAL HIGH (ref 70–99)
POTASSIUM: 3.5 mmol/L (ref 3.5–5.1)
SODIUM: 126 mmol/L — AB (ref 135–145)

## 2018-05-04 LAB — LACTIC ACID, PLASMA
LACTIC ACID, VENOUS: 1.9 mmol/L (ref 0.5–1.9)
Lactic Acid, Venous: 2.6 mmol/L (ref 0.5–1.9)

## 2018-05-04 LAB — PHOSPHORUS
PHOSPHORUS: 3.8 mg/dL (ref 2.5–4.6)
PHOSPHORUS: 4.6 mg/dL (ref 2.5–4.6)

## 2018-05-04 LAB — PROTIME-INR
INR: 1.61
Prothrombin Time: 19 seconds — ABNORMAL HIGH (ref 11.4–15.2)

## 2018-05-04 LAB — MAGNESIUM
MAGNESIUM: 1.4 mg/dL — AB (ref 1.7–2.4)
MAGNESIUM: 2.3 mg/dL (ref 1.7–2.4)

## 2018-05-04 LAB — HEMOGLOBIN AND HEMATOCRIT, BLOOD
HCT: 15.9 % — ABNORMAL LOW (ref 35.0–47.0)
HCT: 24.9 % — ABNORMAL LOW (ref 35.0–47.0)
Hemoglobin: 5.2 g/dL — ABNORMAL LOW (ref 12.0–16.0)
Hemoglobin: 8.3 g/dL — ABNORMAL LOW (ref 12.0–16.0)

## 2018-05-04 LAB — TROPONIN I: Troponin I: <0.03 ng/mL (ref <0.03)

## 2018-05-04 LAB — HEMOGLOBIN: Hemoglobin: 5.2 g/dL — ABNORMAL LOW (ref 12.0–16.0)

## 2018-05-04 LAB — ABO/RH: ABO/RH(D): O POS

## 2018-05-04 LAB — ALBUMIN: ALBUMIN: 2 g/dL — AB (ref 3.5–5.0)

## 2018-05-04 SURGERY — LAPAROTOMY, EXPLORATORY
Anesthesia: General | Laterality: Right

## 2018-05-04 MED ORDER — SUGAMMADEX SODIUM 200 MG/2ML IV SOLN
INTRAVENOUS | Status: DC | PRN
  Administered 2018-05-04: 150 mg via INTRAVENOUS

## 2018-05-04 MED ORDER — BUPIVACAINE-EPINEPHRINE (PF) 0.5% -1:200000 IJ SOLN
INTRAMUSCULAR | Status: AC
  Filled 2018-05-04: qty 30

## 2018-05-04 MED ORDER — EPHEDRINE SULFATE 50 MG/ML IJ SOLN
INTRAMUSCULAR | Status: DC | PRN
  Administered 2018-05-04 (×10): 10 mg via INTRAVENOUS

## 2018-05-04 MED ORDER — SODIUM CHLORIDE 0.9% IV SOLUTION
Freq: Once | INTRAVENOUS | Status: AC
  Administered 2018-05-04: 10 mL/h via INTRAVENOUS

## 2018-05-04 MED ORDER — ROCURONIUM BROMIDE 50 MG/5ML IV SOLN
INTRAVENOUS | Status: AC
  Filled 2018-05-04: qty 1

## 2018-05-04 MED ORDER — MAGNESIUM SULFATE 4 GM/100ML IV SOLN
4.0000 g | Freq: Once | INTRAVENOUS | Status: AC
  Administered 2018-05-04: 4 g via INTRAVENOUS
  Filled 2018-05-04: qty 100

## 2018-05-04 MED ORDER — CHLORHEXIDINE GLUCONATE 0.12% ORAL RINSE (MEDLINE KIT): 15.0000 mL | Freq: Two times a day (BID) | OROMUCOSAL | Status: DC

## 2018-05-04 MED ORDER — FAT EMULSION PLANT BASED 20 % IV EMUL
250.0000 mL | INTRAVENOUS | Status: DC
  Administered 2018-05-05: 250 mL via INTRAVENOUS
  Filled 2018-05-04 (×5): qty 250

## 2018-05-04 MED ORDER — SODIUM CHLORIDE 0.9 % IV SOLN
INTRAVENOUS | Status: DC | PRN
  Administered 2018-05-04: 14:00:00 via INTRAVENOUS

## 2018-05-04 MED ORDER — ACETAMINOPHEN 10 MG/ML IV SOLN
INTRAVENOUS | Status: AC
  Filled 2018-05-04: qty 100

## 2018-05-04 MED ORDER — SODIUM CHLORIDE 0.9 % IV SOLN
INTRAVENOUS | Status: DC | PRN
  Administered 2018-05-04: 70 mL

## 2018-05-04 MED ORDER — SUCCINYLCHOLINE CHLORIDE 20 MG/ML IJ SOLN
INTRAMUSCULAR | Status: AC
  Filled 2018-05-04: qty 1

## 2018-05-04 MED ORDER — ONDANSETRON HCL 4 MG/2ML IJ SOLN
INTRAMUSCULAR | Status: AC
  Filled 2018-05-04: qty 2

## 2018-05-04 MED ORDER — DEXAMETHASONE SODIUM PHOSPHATE 10 MG/ML IJ SOLN
INTRAMUSCULAR | Status: DC | PRN
  Administered 2018-05-04: 10 mg via INTRAVENOUS

## 2018-05-04 MED ORDER — FENTANYL BOLUS VIA INFUSION
50.0000 ug | INTRAVENOUS | Status: DC | PRN
  Administered 2018-05-05 – 2018-05-07 (×3): 50 ug via INTRAVENOUS
  Filled 2018-05-04: qty 50

## 2018-05-04 MED ORDER — FENTANYL CITRATE (PF) 100 MCG/2ML IJ SOLN
INTRAMUSCULAR | Status: DC | PRN
  Administered 2018-05-04: 100 ug via INTRAVENOUS
  Administered 2018-05-04: 50 ug via INTRAVENOUS

## 2018-05-04 MED ORDER — FENTANYL CITRATE (PF) 100 MCG/2ML IJ SOLN
INTRAMUSCULAR | Status: AC
  Filled 2018-05-04: qty 2

## 2018-05-04 MED ORDER — FENTANYL CITRATE (PF) 100 MCG/2ML IJ SOLN
50.0000 ug | Freq: Once | INTRAMUSCULAR | Status: AC
  Administered 2018-05-04: 50 ug via INTRAVENOUS
  Filled 2018-05-04: qty 2

## 2018-05-04 MED ORDER — ACETAMINOPHEN 10 MG/ML IV SOLN
INTRAVENOUS | Status: DC | PRN
  Administered 2018-05-04: 1000 mg via INTRAVENOUS

## 2018-05-04 MED ORDER — SUCCINYLCHOLINE CHLORIDE 20 MG/ML IJ SOLN
INTRAMUSCULAR | Status: DC | PRN
  Administered 2018-05-04: 100 mg via INTRAVENOUS

## 2018-05-04 MED ORDER — ROCURONIUM BROMIDE 100 MG/10ML IV SOLN
INTRAVENOUS | Status: DC | PRN
  Administered 2018-05-04: 20 mg via INTRAVENOUS
  Administered 2018-05-04: 35 mg via INTRAVENOUS
  Administered 2018-05-04: 5 mg via INTRAVENOUS
  Administered 2018-05-04 (×3): 10 mg via INTRAVENOUS

## 2018-05-04 MED ORDER — BUPIVACAINE LIPOSOME 1.3 % IJ SUSP
INTRAMUSCULAR | Status: AC
  Filled 2018-05-04: qty 20

## 2018-05-04 MED ORDER — LIDOCAINE HCL (CARDIAC) PF 100 MG/5ML IV SOSY
PREFILLED_SYRINGE | INTRAVENOUS | Status: DC | PRN
  Administered 2018-05-04: 100 mg via INTRAVENOUS

## 2018-05-04 MED ORDER — SODIUM BICARBONATE 8.4 % IV SOLN
150.0000 meq | Freq: Once | INTRAVENOUS | Status: AC
  Administered 2018-05-04: 150 meq via INTRAVENOUS
  Filled 2018-05-04: qty 150

## 2018-05-04 MED ORDER — MIDAZOLAM HCL 2 MG/2ML IJ SOLN: 2.0000 mg | INTRAMUSCULAR | Status: DC | PRN

## 2018-05-04 MED ORDER — SUGAMMADEX SODIUM 200 MG/2ML IV SOLN
INTRAVENOUS | Status: AC
  Filled 2018-05-04: qty 2

## 2018-05-04 MED ORDER — ORAL CARE MOUTH RINSE: 15.0000 mL | OROMUCOSAL | Status: DC

## 2018-05-04 MED ORDER — PHENYLEPHRINE HCL 10 MG/ML IJ SOLN
INTRAMUSCULAR | Status: DC | PRN
  Administered 2018-05-04 (×3): 100 ug via INTRAVENOUS
  Administered 2018-05-04: 200 ug via INTRAVENOUS
  Administered 2018-05-04 (×2): 100 ug via INTRAVENOUS
  Administered 2018-05-04: 200 ug via INTRAVENOUS
  Administered 2018-05-04 (×2): 100 ug via INTRAVENOUS
  Administered 2018-05-04: 200 ug via INTRAVENOUS
  Administered 2018-05-04: 100 ug via INTRAVENOUS

## 2018-05-04 MED ORDER — CEFAZOLIN SODIUM-DEXTROSE 2-3 GM-%(50ML) IV SOLR
INTRAVENOUS | Status: DC | PRN
  Administered 2018-05-04: 2 g via INTRAVENOUS

## 2018-05-04 MED ORDER — SODIUM CHLORIDE FLUSH 0.9 % IV SOLN
INTRAVENOUS | Status: AC
  Filled 2018-05-04: qty 50

## 2018-05-04 MED ORDER — SODIUM CHLORIDE 0.9% IV SOLUTION
Freq: Once | INTRAVENOUS | Status: AC
  Administered 2018-05-04: 11:00:00 via INTRAVENOUS

## 2018-05-04 MED ORDER — MORPHINE SULFATE (PF) 2 MG/ML IV SOLN
1.0000 mg | Freq: Once | INTRAVENOUS | Status: AC
  Administered 2018-05-04: 1 mg via INTRAVENOUS
  Filled 2018-05-04: qty 1

## 2018-05-04 MED ORDER — TRACE MINERALS CR-CU-MN-SE-ZN 10-1000-500-60 MCG/ML IV SOLN
INTRAVENOUS | Status: AC
  Filled 2018-05-04: qty 1560

## 2018-05-04 MED ORDER — BISACODYL 10 MG RE SUPP
10.0000 mg | Freq: Every day | RECTAL | Status: DC | PRN
Start: 2018-05-04 — End: 2018-05-17

## 2018-05-04 MED ORDER — BUDESONIDE 0.5 MG/2ML IN SUSP
0.5000 mg | Freq: Two times a day (BID) | RESPIRATORY_TRACT | Status: DC
  Administered 2018-05-04 – 2018-05-17 (×26): 0.5 mg via RESPIRATORY_TRACT
  Filled 2018-05-04 (×26): qty 2

## 2018-05-04 MED ORDER — DEXAMETHASONE SODIUM PHOSPHATE 10 MG/ML IJ SOLN
INTRAMUSCULAR | Status: AC
  Filled 2018-05-04: qty 1

## 2018-05-04 MED ORDER — MIDAZOLAM HCL 2 MG/2ML IJ SOLN
2.0000 mg | INTRAMUSCULAR | Status: DC | PRN
  Administered 2018-05-05: 2 mg via INTRAVENOUS
  Filled 2018-05-04: qty 2

## 2018-05-04 MED ORDER — ORAL CARE MOUTH RINSE
15.0000 mL | OROMUCOSAL | Status: DC
  Administered 2018-05-04 – 2018-05-08 (×36): 15 mL via OROMUCOSAL

## 2018-05-04 MED ORDER — IPRATROPIUM-ALBUTEROL 0.5-2.5 (3) MG/3ML IN SOLN
3.0000 mL | Freq: Two times a day (BID) | RESPIRATORY_TRACT | Status: DC
  Filled 2018-05-04: qty 3

## 2018-05-04 MED ORDER — BUPIVACAINE-EPINEPHRINE 0.5% -1:200000 IJ SOLN
INTRAMUSCULAR | Status: DC | PRN
  Administered 2018-05-04: 30 mL

## 2018-05-04 MED ORDER — IPRATROPIUM-ALBUTEROL 0.5-2.5 (3) MG/3ML IN SOLN
3.0000 mL | Freq: Four times a day (QID) | RESPIRATORY_TRACT | Status: DC
  Administered 2018-05-04 – 2018-05-10 (×24): 3 mL via RESPIRATORY_TRACT
  Filled 2018-05-04 (×25): qty 3

## 2018-05-04 MED ORDER — MIDAZOLAM HCL 2 MG/2ML IJ SOLN
INTRAMUSCULAR | Status: AC
  Filled 2018-05-04: qty 2

## 2018-05-04 MED ORDER — FAMOTIDINE IN NACL 20-0.9 MG/50ML-% IV SOLN
20.0000 mg | Freq: Two times a day (BID) | INTRAVENOUS | Status: DC
  Administered 2018-05-04 – 2018-05-16 (×23): 20 mg via INTRAVENOUS
  Filled 2018-05-04 (×24): qty 50

## 2018-05-04 MED ORDER — SODIUM CHLORIDE 0.9 % IV SOLN
INTRAVENOUS | Status: DC
  Administered 2018-05-04: 20:00:00 via INTRAVENOUS

## 2018-05-04 MED ORDER — CEFAZOLIN SODIUM 1 G IJ SOLR
INTRAMUSCULAR | Status: AC
  Filled 2018-05-04: qty 20

## 2018-05-04 MED ORDER — MIDAZOLAM HCL 2 MG/2ML IJ SOLN
INTRAMUSCULAR | Status: DC | PRN
  Administered 2018-05-04: 2 mg via INTRAVENOUS

## 2018-05-04 MED ORDER — NOREPINEPHRINE 4 MG/250ML-% IV SOLN
0.0000 ug/min | INTRAVENOUS | Status: DC
  Administered 2018-05-05: 3 ug/min via INTRAVENOUS
  Administered 2018-05-05: 5 ug/min via INTRAVENOUS
  Filled 2018-05-04: qty 250

## 2018-05-04 MED ORDER — METRONIDAZOLE IN NACL 5-0.79 MG/ML-% IV SOLN
INTRAVENOUS | Status: DC | PRN
  Administered 2018-05-04: 500 mg via INTRAVENOUS

## 2018-05-04 MED ORDER — FENTANYL 2500MCG IN NS 250ML (10MCG/ML) PREMIX INFUSION
25.0000 ug/h | INTRAVENOUS | Status: AC
  Administered 2018-05-04: 50 ug/h via INTRAVENOUS
  Administered 2018-05-05: 100 ug/h via INTRAVENOUS
  Administered 2018-05-06: 75 ug/h via INTRAVENOUS
  Filled 2018-05-04 (×4): qty 250

## 2018-05-04 MED ORDER — LIDOCAINE HCL (PF) 2 % IJ SOLN
INTRAMUSCULAR | Status: AC
  Filled 2018-05-04: qty 10

## 2018-05-04 MED ORDER — TRACE MINERALS CR-CU-MN-SE-ZN 10-1000-500-60 MCG/ML IV SOLN
INTRAVENOUS | Status: DC
  Filled 2018-05-04: qty 1560

## 2018-05-04 MED ORDER — PROPOFOL 10 MG/ML IV BOLUS
INTRAVENOUS | Status: DC | PRN
  Administered 2018-05-04: 130 mg via INTRAVENOUS

## 2018-05-04 MED ORDER — SODIUM CHLORIDE 0.9 % IV BOLUS
1000.0000 mL | Freq: Once | INTRAVENOUS | Status: AC
  Administered 2018-05-04: 1000 mL via INTRAVENOUS

## 2018-05-04 MED ORDER — ONDANSETRON HCL 4 MG/2ML IJ SOLN
INTRAMUSCULAR | Status: DC | PRN
  Administered 2018-05-04: 4 mg via INTRAVENOUS

## 2018-05-04 MED ORDER — METRONIDAZOLE IN NACL 5-0.79 MG/ML-% IV SOLN
500.0000 mg | Freq: Once | INTRAVENOUS | Status: DC
  Filled 2018-05-04: qty 100

## 2018-05-04 SURGICAL SUPPLY — 36 items
CANISTER SUCT 1200ML W/VALVE (MISCELLANEOUS) IMPLANT
CANISTER SUCT 3000ML PPV (MISCELLANEOUS) ×3 IMPLANT
CHLORAPREP W/TINT 26ML (MISCELLANEOUS) ×3 IMPLANT
DRAPE LAPAROTOMY 100X77 ABD (DRAPES) ×3 IMPLANT
DRSG OPSITE POSTOP 4X10 (GAUZE/BANDAGES/DRESSINGS) ×3 IMPLANT
DRSG OPSITE POSTOP 4X8 (GAUZE/BANDAGES/DRESSINGS) IMPLANT
DRSG TEGADERM 4X10 (GAUZE/BANDAGES/DRESSINGS) IMPLANT
DRSG TELFA 3X8 NADH (GAUZE/BANDAGES/DRESSINGS) IMPLANT
ELECT REM PT RETURN 9FT ADLT (ELECTROSURGICAL) ×3
ELECTRODE REM PT RTRN 9FT ADLT (ELECTROSURGICAL) ×1 IMPLANT
GLOVE BIO SURGEON STRL SZ 6.5 (GLOVE) ×12 IMPLANT
GLOVE BIO SURGEONS STRL SZ 6.5 (GLOVE) ×6
GOWN STRL REUS W/ TWL LRG LVL3 (GOWN DISPOSABLE) ×6 IMPLANT
GOWN STRL REUS W/TWL LRG LVL3 (GOWN DISPOSABLE) ×12
HANDLE YANKAUER SUCT BULB TIP (MISCELLANEOUS) ×3 IMPLANT
KIT OSTOMY 2 PC DRNBL 2.25 STR (WOUND CARE) ×2 IMPLANT
KIT OSTOMY DRAINABLE 2.25 STR (WOUND CARE) ×4
KIT TURNOVER KIT A (KITS) ×3 IMPLANT
LABEL OR SOLS (LABEL) ×3 IMPLANT
NEEDLE HYPO 25X1 1.5 SAFETY (NEEDLE) ×3 IMPLANT
NS IRRIG 1000ML POUR BTL (IV SOLUTION) ×6 IMPLANT
PACK BASIN MAJOR ARMC (MISCELLANEOUS) ×3 IMPLANT
PACK COLON CLEAN CLOSURE (MISCELLANEOUS) ×3 IMPLANT
RELOAD PROXIMATE 75MM BLUE (ENDOMECHANICALS) ×6 IMPLANT
SPONGE LAP 18X18 RF (DISPOSABLE) ×3 IMPLANT
STAPLER PROXIMATE 75MM BLUE (STAPLE) ×6 IMPLANT
SUT PDS AB 1 TP1 54 (SUTURE) ×6 IMPLANT
SUT SILK 2 0 (SUTURE) ×2
SUT SILK 2-0 18XBRD TIE 12 (SUTURE) ×1 IMPLANT
SUT SILK 3 0 (SUTURE) ×2
SUT SILK 3-0 18XBRD TIE 12 (SUTURE) ×1 IMPLANT
SUT VIC AB 3-0 SH 27 (SUTURE) ×4
SUT VIC AB 3-0 SH 27X BRD (SUTURE) ×2 IMPLANT
SUT VICRYL 3-0 CR8 SH (SUTURE) ×6 IMPLANT
SYR 30ML LL (SYRINGE) ×6 IMPLANT
TRAY FOLEY MTR SLVR 16FR STAT (SET/KITS/TRAYS/PACK) IMPLANT

## 2018-05-04 NOTE — Progress Notes (Signed)
Pt noted to have 3 bloody stools over the last 6 hours and amount noted to be moderate for each. Pt has not had any bloody stools prior. Last hemoglobin was 8.6 on 7/19. Notified MD Sridharan and order placed to check hemoglobin level with AM labs. Pt is asymptomatic otherwise at this time. Nursing staff will continue to monitor for any changes in patient status. Earleen Reaper, RN

## 2018-05-04 NOTE — Op Note (Signed)
Preoperative diagnosis: Hepatic flexure bleeding ulcer  Postoperative diagnosis: Hepatic flexure bleeding ulcer  Procedure:  Exploratory Laparotomy                       Right hemicolectomy with end ileostomy and mucus fistula                       Anesthesia: GETA  Surgeon: Dr. Windell Moment, MD  Assistant Surgeon: Dr. Lysle Pearl  Wound Classification: Clean Contaminated  Indications: Patient is a 62 y.o. female with lower GI bleeding that on Colonoscopy was found to have an circumferential area of ischemic ulceration not amendable to colonoscopic bleeding control since it was circumferential. The patient had a drop in hemoglobin down to 5.7 with hypotensive and lethargic episode. As per Hospitalist and GI service there was no other choice than surgical management.   Findings: 1. Palpable thickness area at the hepatic flexure 2. Siginificantl blood in the colon 3. Area of ulceration of the hepatic flexure identified in the backtable  4. Some blood identified in the small intestine when ileostomy was getting matured.  5. Nodular and significant fatty infiltrated liver.  6. Adequate hemostasis  Description of procedure:  The patient was placed in the supine position and general endotracheal anesthesia was induced. Sequential pneumatic compression devices were placed on lower extremities. Preoperative antibiotics were given (Anfec and Metronidazole). A Foley catheter and nasogastric tube were placed. The abdomen was prepped and draped in the usual sterile fashion. A time-out was completed verifying correct patient, procedure, site, positioning, and implant(s) and/or special equipment prior to beginning this procedure.  A vertical midline incision was made from xiphoid to just below the umbilicus. This was deepened through the subcutaneous tissues and hemostasis was achieved with electrocautery. The linea alba was identified and incised and the peritoneal cavity entered.  The abdomen was  explored. The transverse colon was elevated and the ligament of Treitz identified. Small bowel inspected from Treitz to terminal ileum and was found intact. The Ascending colon was found healthy. There was a thickening in the hepatic flexure. The transverse colon, descending, sigmoid colon and proximal rectum was identified without any disease. The stomach anterior wall, and first portion of duodenum was inspected and found intact. Gallbladder looks intact. The liver was found with significant fatty infiltration and nodular.  The small bowel was inspected and retracted to the left using a moist gauze and self-retaining retractor. Using electrocautery, the colon was freed from its peritoneal attachments along the avascular line of Toldt from the cecum to the hepatic flexure. Additional lateral peritoneal coverings were incised to further mobilize the colon. The dissection was extended across the ileocolic junction and terminal ileum was mobilized. Both ureters were identified and protected, as were the duodenum, right kidney, and gonadal vessels. The hepatic flexure was carefully mobilized by dividing the peritoneum in the hepatorenal fossa.  Points of transection were selected proximally and distally. The bowel was divided with the linear cutting stapler. The peritoneum overlying the mesentery was then scored with electrocautery and the ileocolic artery was identified, and divided with LigaSure. The right branch/main trunk of the middle colic and right colic arteries were similarly identified and divided. Remaining mesentery and all associated nodal tissue was divided and swept down with the specimen using the LigaSure device. The specimen was removed, inspected in the back table and sent to pathology. Hemostasis was checked in the operative field. The two ends of bowel were checked and found  to be viable, with excellent blood supply. The proximal end of the ileum easily reached the intended right lower quadrant  stoma site.  A small circular disc of skin was excised at the right lower quadrant. The subcutaneous fat was retracted and held out of the field. The anterior rectus sheath was incised with a cruciate incision. The rectus fibers were dissected bluntly with clamps. The posterior rectus sheath and peritoneum were then entered with a cruciate incision. The area was checked for hemostasis; the inferior epigastric artery was identified and also found to be intact. A large babcock clamp was passed through the hole. The proximal edge of the ileum was grasped and carefully delivered through the stoma, along with the supporting mesentery. Care was taken to keep the ileum in its correct orientation and to avoid torsion. There was no tension on the ileum and adequate length was present. The transverse colon was matured to form a mucous fistula. The midline incision was then closed. The fascia was closed with PDS 1 and the skin was closed with staples. A sterile towel was placed over the incision to protect while the stoma was matured.  The stapled edge of the ileum was excised to produce a fresh bleeding edge. The ileostomy was then everted and matured. An orienting suture were placed in each quadrant that went through the full thickness of the bowel at the cut edge, then through the seromuscular layer 3-5 cm proximally, just adjacent the skin, and finally to the dermis. All orienting sutures were tied. Additional interrupted sutures were placed between the orienting sutures from the cut edge of the bowel to the dermis until the stoma was securely formed. An stoma appliance was placed and a sterile dressing was placed over the midline incision.  The patient tolerated the procedure well. All counts were correct. Anesthesia was reversed and the patient was extubated. The patient was taken to the postoperative care unit in stable condition.   Specimen: Right colon                     Complications: None  Estimated  Blood Loss: 100 mL

## 2018-05-04 NOTE — Progress Notes (Signed)
Patient lethargic, MD at bedside,BP 74/48,  order given for emergent blood transfusion and order to move patient to step down. Will retrieve  blood and begin transfusion upon waiting for stepdown bed.

## 2018-05-04 NOTE — Progress Notes (Signed)
Patient ID: Danielle [name], female   DOB: [birth year], 62 y.o.   MRN: [id number]     [facility] Day(s): 13.   Interval History: Patient seen and examined. Patient refers had multiple large bowel movements that were described as bloody. Patient refers [name] on the right lower quadrant that was controlled with acetaminophen. Denies nausea and vomiting.   Vital signs in last 24 hours: [min-max] current  Temp:  [97.7 F (36.5 C)] 97.7 F (36.5 C) (07/21 0544) Pulse Rate:  [69-76] 69 (07/21 0802) Resp:  [14-24] 14 (07/21 0802) BP: (101-150)/(51-100) 101/61 (07/21 0802) SpO2:  [99 %-100 %] 100 % (07/21 0802) Weight:  [69.3 kg (152 lb 12.8 oz)] 69.3 kg (152 lb 12.8 oz) (07/21 0524)     Height: 5\' 6"  (167.6 cm) Weight: 69.3 kg (152 lb 12.8 oz) BMI (Calculated): 24.67   Physical Exam:  Constitutional: alert, cooperative and no distress  Gastrointestinal: soft, non-tender, and non-distended, no guarding, no rebound tenderness  Labs:  CBC Latest Ref Rng & Units 05/04/2018 05/02/2018 05/01/2018  WBC 3.6 - 11.0 K/uL - 6.9 5.8  Hemoglobin 12.0 - 16.0 g/dL 5.2(L) 8.6(L) 8.0(L)  Hematocrit 35.0 - 47.0 % - 25.0(L) 24.1(L)  Platelets 150 - 440 K/uL - 298 288   CMP Latest Ref Rng & Units 05/04/2018 05/03/2018 05/02/2018  Glucose 70 - 99 mg/dL 123(H) 116(H) 99  BUN 8 - 23 mg/dL 21 13 13   Creatinine 0.44 - 1.00 mg/dL 0.98 0.92 0.78  Sodium 135 - 145 mmol/L 126(L) 129(L) 127(L)  Potassium 3.5 - 5.1 mmol/L 3.5 3.4(L) 4.3  Chloride 98 - 111 mmol/L 95(L) 97(L) 99  CO2 22 - 32 mmol/L 25 24 19(L)  Calcium 8.9 - 10.3 mg/dL 7.5(L) 8.4(L) 8.1(L)  Total Protein 6.5 - 8.1 g/dL - 6.1(L) 6.3(L)  Total Bilirubin 0.3 - 1.2 mg/dL - 1.3(H) 1.6(H)  Alkaline Phos 38 - 126 U/L - 97 112  AST 15 - 41 U/L - 72(H) 88(H)  ALT 0 - 44 U/L - 110(H) 127(H)   Imaging studies: No new pertinent imaging studies   Assessment/Plan:  62 y.o. female with ischemic colitis of the hepatic flexure.  Patient had a  significant lower GI bleeding with drop of hemoglobin down to 5.2. Due to patient cardiac conditions, agree with PRBC transfusions. Patient currently does not has chest [name], usual shortness of breath when sitting down but not at rest, no dizziness or altered mental status.  Agree with holding anticoagulation. There is no acute abdomen at this moment for emergent surgery. Patient hemodynamically stable. Will follow up closely. As discussed previously, patient very high risk for surgery.    Arnold Long, MD

## 2018-05-04 NOTE — Progress Notes (Signed)
Patient ID: Danielle [name], female   DOB: [birth year], 62 y.o.   MRN: [id number]  I was called by Dr. Vianne Bulls to discuss the case. She refers that the patient got unstable after having more bloody stools. The patient had hemoglobin of 5 this morning but was asymptomatic. During the morning she developed more bloody stools and had a blood pressure of 66/44 (systolic) and became lethargic. She received two units of PRBC emergently and one liter of saline. Patient was transferred to ICU and got stabilized. At this moment the patient is alert and oriented x3. I discussed with her the alternative of surgical management since conservative management failed. I also discussed with the daughter and the granddaughter the plan of surgery which is a right colectomy with possible ileostomy creation. If patient becomes unstable during the surgery will only do the colectomy, leave the abdomen open with temporary closure and stabilize to mature ostomies when stable. All this alternative were discussed with patient and her daughter. Also oriented about the risk of cardiac and pulmonary complications. Patient already with chronic severe cardiac and pulmonary conditions. Discussed about the possibility of prolonged mechanical ventilation, risk of myocardial infarction and even death.  Patient and her daughter understood and decides wants to proceed and do everything it takes.  Will take to OR. Will go back to ICU for post op care.

## 2018-05-04 NOTE — Progress Notes (Signed)
Patient seen and examined. Briefly this is a 62 y/o female initially admitted with acute hypoxic/hypercarbic respiratory failure; had a prolonged and complicated hospitalization course that involved new onset Afib with RVR, started on anticoagulation and developed an acute GI bleed and ischemic colitis and severe blood loss anemia. She is now s/p Exploratory Laparotomyright hemicolectomy withend ileostomy and mucus fistula. Post procedure, patient had to be re-intubated for acute respiratory failure.   Physical exam  Constitutional:  HENT:  Cardiovascular:  Pulmonary/Chest:  Abdominal:   Musculoskeletal:  Neurological:  Skin:  Assessment Hepatic flexure bleeding ulcer and Ischemic colitis s/p  Exploratory Laparotomyright hemicolectomy withend ileostomy and mucus fistula. Acute blood loss anemia Chronic systolic heart failure Right Hilar mass/lung cancer Hemorrhagic shock Acute hypoxic/hypercarbic respiratory failure Afib with RVR Acute renal failure Protein calorie malnutrition  Plan Full vent support with current settings Abdominal x-ray to check NGT placement STAT ABG STAT CBC, CMP, lactic acid and troponin-reviewed Continue to trend H/H and transfuse if hemoglobin<7% IV fluids and pressors to maintain MAP>65 Hemodynamics per ICU protocol Monitor and correct electrolytes Fentanyl and versed for vent sedation and comfort Surgery and Gi following GI and DVT prophylaxis   [facility] ANP-BC Pulmonary and Critical Care Medicine [facility] Pager [phone] or [phone]  NB: This document was prepared using Dragon voice recognition software and may include unintentional dictation errors.

## 2018-05-04 NOTE — Anesthesia Procedure Notes (Signed)
Procedure Name: Intubation Performed by: Milus Height, CRNA Pre-anesthesia Checklist: Patient identified, Patient being monitored, Timeout performed, Emergency Drugs available and Suction available Patient Re-evaluated:Patient Re-evaluated prior to induction Oxygen Delivery Method: Circle system utilized Preoxygenation: Pre-oxygenation with 100% oxygen Induction Type: IV induction and Rapid sequence Laryngoscope Size: 3 and McGraph Grade View: Grade I Tube type: Oral Tube size: 7.0 mm Number of attempts: 1 Airway Equipment and Method: Stylet Placement Confirmation: ETT inserted through vocal cords under direct vision,  positive ETCO2 and breath sounds checked- equal and bilateral Secured at: 21 cm Tube secured with: Tape Dental Injury: Teeth and Oropharynx as per pre-operative assessment

## 2018-05-04 NOTE — Transfer of Care (Signed)
Immediate Anesthesia Transfer of Care Note  Patient: CLYTIE SHETLEY  Procedure(s) Performed: EXPLORATORY LAPAROTOMY right colectomy right and left ostomy (Right )  Patient Location: PACU  Anesthesia Type:General  Level of Consciousness: drowsy, patient cooperative and responds to stimulation  Airway & Oxygen Therapy: Patient Spontanous Breathing and Patient connected to face mask oxygen  Post-op Assessment: Report given to RN and Post -op Vital signs reviewed and stable  Post vital signs: Reviewed and stable  Last Vitals:  Vitals Value Taken Time  BP 144/105 05/04/2018  5:39 PM  Temp    Pulse    Resp 15 05/04/2018  5:40 PM  SpO2    Vitals shown include unvalidated device data.  Last Pain:  Vitals:   05/04/18 1323  TempSrc:   PainSc: Asleep      Patients Stated Pain Goal: 0 (42/39/53 2023)  Complications: No apparent anesthesia complications

## 2018-05-04 NOTE — Progress Notes (Signed)
   05/04/18 1755  Clinical Encounter Type  Visited With Patient not available  Visit Type Post-op   Chaplain received a call regarding the patient's possible reintubation. Upon arrival, medical team was assessing patient's status and monitoring her respiration. As stats continued to decline, patient was reintubated. Patient will return to ICU.

## 2018-05-04 NOTE — Progress Notes (Signed)
[place] [facility] [place] NAME: Danielle [name]    MR#:  [id number]  DATE OF BIRTH:  1956-06-10  3 episodes of hematochezia this morning, patient is lethargic, hypotensive, hemoglobin dropped to 5.2 from 8.6.  CHIEF COMPLAINT:   Chief Complaint  Patient presents with  . Shortness of Breath  Patient appears lethargic but opens eyes to calling her name but does not talk like yesterday.  She complained of abdominal pain to the registered nurse earlier.  REVIEW OF SYSTEMS:    DRUG ALLERGIES:  No Known Allergies VITALS:  Blood pressure (!) 107/53, pulse 64, temperature (!) 97.5 F (36.4 C), temperature source Oral, resp. rate 12, height 5\' 6"  (1.676 m), weight 72.2 kg (159 lb 2.8 oz), SpO2 100 %. PHYSICAL EXAMINATION:  Physical Exam  Constitutional: She is oriented to person, place, and time. She appears well-developed.  HENT:  Head: Normocephalic.  Mouth/Throat: Oropharynx is clear and moist.  Eyes: Pupils are equal, round, and reactive to light. Conjunctivae and EOM are normal. No scleral icterus.  Neck: Normal range of motion. Neck supple. No tracheal deviation present.  Cardiovascular: Normal rate and normal heart sounds. Exam reveals no gallop.  No murmur heard. Pulmonary/Chest: Effort normal. No stridor. No respiratory distress. She has no wheezes. She has no rales.  Abdominal: Bowel sounds are normal. She exhibits distension. There is tenderness. There is no rebound.  Musculoskeletal: Normal range of motion. She exhibits edema. She exhibits no tenderness.  Bilateral leg edema  Neurological: She is alert and oriented to person, place, and time. No cranial nerve deficit.  Skin: No rash noted. No erythema.  Psychiatric: She has a normal mood and affect.  Lethargic   LABORATORY PANEL:  Female CBC Recent Labs  Lab 05/02/18 0422  05/04/18 1406  WBC 6.9  --   --   HGB 8.6*   < > 8.3*  HCT 25.0*   < > 24.9*  PLT 298  --   --    < > =  values in this interval not displayed.   ------------------------------------------------------------------------------------------------------------------ Chemistries  Recent Labs  Lab 05/03/18 0635 05/04/18 0828  NA 129* 126*  K 3.4* 3.5  CL 97* 95*  CO2 24 25  GLUCOSE 116* 123*  BUN 13 21  CREATININE 0.92 0.98  CALCIUM 8.4* 7.5*  MG 1.5* 1.4*  AST 72*  --   ALT 110*  --   ALKPHOS 97  --   BILITOT 1.3*  --    RADIOLOGY:  No results found. ASSESSMENT AND PLAN:   89F acute hypoxemic respiratory failure, acute COPD exacerbation, dehydration, hypokalemia, hypoglycemia, renal failure, hypocalcemia, hypoalbuminemia, transaminasemia/hyperbilirubinemia, BNP elevation, Troponin-I elevation, macrocytic anemia.  Hemorrhagic shock;Patient had 3 episodes of hematochezia, hemoglobin dropped to 5.2 and she is also hypotensive.  Transfer the patient this is a stepdown,  getting emergency blood transfusion,, discussed with patient''s daughter [name]. will came to the hospital. I spoke with Dr. Bonna Gains, Dr. Delfino Lovett and Dr. Windell Moment. Because of ischemic colitis, ulcerated hepatic flexure with ongoing GI bleed patient needs emergency surgery and possible colectomy.  Discussed with surgeon, GI, patient daughter.     Acute on chronic respiratory failure with hypoxia and hypercapnia due to COPD exacerbation, radiation pneumonitis/fibrosis and CHF. The patient is off BiPAP, on oxygen by nasal cannula 2L (home O2). Continue nebulizer and DuoNeb as needed, completed Zithromax, discontinued IV Solu-Medrol, changed to prednisone and completed. continue bronchodilators, oxygen.  New onset A. fib with RVR. Hypotensive  today morning so hold all BP medicines, Eliquis.   Elevated troponin due to demanding ischemia.  Acute on chronic systolic CHF LV EF: 43% - 40%. ` Hypokalemia. She was given potassium supplement and improved.  Hyperkalemia.  improved.    Acute renal failure due to ATN  and dehydration. Improved with normal saline IV.   Hypomagnesemia and hypophosphatemia. Improved with supplement. Hypomagnesemia.  Mg 1.3. Given magnesium IV and improved.  Moderate Malnutritionrelated to chronic illness. Started TPN due to ischemic colitis.  Ischemic colitis. The patient was initially suspected due to constipation.  She still has abdominal pain after large bowel movement. Abdominal CAT scan: Focal area of irregular wall thickening and relative narrowing of the right colon at and just below the hepatic flexure extending over approximately 7-8 cm.  Pain control. Per Dr. [name], the patient can get colonoscopy, may stop Eliquis 1 to 2 days prior to endoscopy to lower bleeding potential and complication risk and restart as soon as possible thereafter due to patient maintaining normal sinus rhythm. Colonoscopy: The ulcerated mucosa may be due to ischemia.  Sigmoid diverticulosis. Per Dr. Bonna Gains,     CT angiogram shows patent IMA and SMA so large vessel disease is likely not the cause of the colitis.  As such, no role for revascularization or other vascular surgery intervention at this time per Dr. Lucky Cowboy.  Liver function test is unremarkable. Abdominal ultrasound shows severe hepatic steatosis.  Hyponatremia.  ,  Sodium down to 126 again.  Continue aggressive hydration.  Stage I x2 lung cancer status post radiation;remote history of breast cancer.Follow-up of Dr. Rogue Bussing as outpatient.  Condition at this time is critical, high risk for cardiac arrest, prognosis poor, discussed the plan with ICU physician Dr. Delfino Lovett, surgeon on-call Dr. Salvadore Oxford daughter [facility].  All the records are reviewed and case discussed with Care Management/Social Worker. Management plans discussed with the patient, her daughter and they are in agreement.   CODE STATUS: Full Code  TOTAL TIME TAKING CARE OF THIS PATIENT: 43 minutes. (critical care time)  More than  50% of the time was spent in counseling/coordination of care: YES  POSSIBLE D/C IN >3 DAYS, DEPENDING ON CLINICAL CONDITION.   Epifanio Lesches M.D on 05/04/2018 at 4:08 PM  Between 7am to 6pm - Pager - [phone]  After 6pm go to [email or web address] - [facility]

## 2018-05-04 NOTE — Consult Note (Signed)
[facility] Consultation      Name: Danielle [name] MRN: [id number] DOB: 28-Nov-1955    ADMISSION DATE:  04/21/2018 CONSULTATION DATE:  05/04/2108  REFERRING MD :  Epifanio Lesches, MD   CHIEF COMPLAINT:     GI bleed   HISTORY OF PRESENT ILLNESS    Danielle [name]  is a 62 y.o. female with a known history of HTN, COPD (2L [place] O2 PRN), known R hilar mass, Hx breast Ca.  She was admitted on 04/21/2018 with shortness of breath and treated for acute exacerbation of COPD on the step down unit.  She improved and was transferred to the floor.  At some stage during her admission she complained of abdominal pain.  She was investigated with Ct scan which identified an area of narrowing just below the hepatic flexure. The diagnosis at that time was ischemic colitis.  She was started on conservative therapy with bowel rest and TPN.  It is reported that the patient developed new onset atrial fibrillation and was initially treated with rate control.   Anticoagulation therapy in the form of Eliquis was added 2 days ago as the patient had a high chads2 score and risk for CVA.  EF was noted to be 35-40% via ECHO.  This morning the patient became hypotensive and was noted to have hematochezia.  She had a drop of 3 grams of hemoglobin.  Hence she was transferred tot he ICU for aggressive management pending surgical evaluation.   I have seen and examined the patient.  She complained of mid abdominal pain but no nausea.      SIGNIFICANT EVENTS   7/8 admission to hospital step down unit 7/21 transferred to ICU because of hypovolemic shock    PAST MEDICAL HISTORY    :  Past Medical History:  Diagnosis Date  . Anxiety   . Arthritis   . Cancer ([place]) left    breast cancer 2000, chemo tx's with total mastectomy and lymph nodes resected.   . Cancer of right lung ([place]) 02/21/2016   rad tx's.   . CHF (congestive heart failure) ([place])   . COPD (chronic obstructive pulmonary  disease) ([place])   . Dependence on supplemental oxygen   . Depression   . Diabetes mellitus without complication ([place])   . Heart murmur   . Hypertension   . Lung nodule   . Lymphedema   . Shortness of breath dyspnea    with exertion  . Status post chemotherapy 2001   left breast cancer  . Status post radiation therapy 2001   left breast cancer   Past Surgical History:  Procedure Laterality Date  . Breast Biospy Left    [facility]  . BREAST SURGERY    . COLONOSCOPY N/A 04/30/2018   Procedure: COLONOSCOPY;  Surgeon: Virgel Manifold, MD;  Location: [facility] ENDOSCOPY;  Service: Endoscopy;  Laterality: N/A;  . DILATION AND CURETTAGE OF UTERUS    . ELECTROMAGNETIC NAVIGATION BROCHOSCOPY Right 04/11/2016   Procedure: ELECTROMAGNETIC NAVIGATION BRONCHOSCOPY;  Surgeon: Vilinda Boehringer, MD;  Location: [facility] ORS;  Service: Cardiopulmonary;  Laterality: Right;  . history of colonoscopy]    . LUNG BIOPSY    . MASTECTOMY Left    2000, [facility]  . ROTATOR CUFF REPAIR Right    [facility]   Prior to Admission medications   Medication Sig Start Date End Date Taking? Authorizing Provider  citalopram (CELEXA) 40 MG tablet Take 40 mg by mouth daily.    Yes [provider]  losartan (COZAAR) 100 MG tablet Take 1 tablet (100 mg total) by mouth daily. 02/12/17  Yes [name], MD  albuterol (PROVENTIL HFA;VENTOLIN HFA) 108 (90 Base) MCG/ACT inhaler Inhale 2 puffs into the lungs every 6 (six) hours as needed for wheezing or shortness of breath. 04/24/18   Demetrios Loll, MD  apixaban (ELIQUIS) 5 MG TABS tablet Take 1 tablet (5 mg total) by mouth 2 (two) times daily. 04/24/18   Demetrios Loll, MD  budesonide-formoterol [facility]) 160-4.5 MCG/ACT inhaler Inhale 2 puffs into the lungs 2 (two) times daily. 04/24/18   Demetrios Loll, MD  celecoxib (CELEBREX) 200 MG capsule Take 1 capsule (200 mg total) by mouth daily. Patient not taking: Reported on 04/21/2018 07/23/15   Menshew, Dannielle Karvonen, PA-C    HYDROcodone-acetaminophen (NORCO/VICODIN) 5-325 MG tablet Take 1 tablet by mouth every 6 (six) hours as needed for moderate pain or severe pain. 04/28/18   Demetrios Loll, MD  metoprolol succinate (TOPROL XL) 50 MG 24 hr tablet Take 3 tablets (150 mg total) by mouth daily. Take with or immediately following a meal. 04/26/18 04/26/19  Demetrios Loll, MD  metoprolol succinate (TOPROL-XL) 25 MG 24 hr tablet Take 25 mg by mouth daily. 10/23/17 10/23/18  [provider]  potassium chloride SA (K-DUR,KLOR-CON) 20 MEQ tablet Take 1 tablet (20 mEq total) by mouth daily. Patient not taking: Reported on 04/21/2018 11/14/15   Forest Gleason, MD  predniSONE (DELTASONE) 20 MG tablet Take 2 tablets (40 mg total) by mouth daily with breakfast. 04/24/18   Demetrios Loll, MD   No Known Allergies   FAMILY HISTORY   Family History  Problem Relation Age of Onset  . Breast cancer Mother 83  . Cancer Mother        Breast   . Cirrhosis Father   . Breast cancer Paternal Aunt 4  . Cancer Maternal Aunt        Breast       SOCIAL HISTORY    reports that she quit smoking about 5 years ago. Her smoking use included cigarettes. She has a 10.00 pack-year smoking history. She has never used smokeless tobacco. She reports that she drinks alcohol. She reports that she does not use drugs.   Review of System  Constitutional:  Negative for chills and fever.  HENT: Negative for sore throat.   Eyes: Negative for blurred vision and double vision.  Respiratory:  Negative for hemoptysis, sputum production, wheezing and stridor.   Cardiovascular: Negative for chest pain, palpitations, orthopnea and leg swelling.  Gastrointestinal: Positive for abdominal pain, hematochezia.  Genitourinary: Negative for dysuria, flank pain and hematuria.  Musculoskeletal: Negative for back pain and joint pain.  Skin: Negative for rash.  Neurological: Negative for dizziness, sensory change, focal weakness, seizures, loss of consciousness, weakness  and headaches.  Endo/Heme/Allergies: Negative for polydipsia.  Psychiatric/Behavioral: Anxious     VITAL SIGNS    Temp:  [97.5 F (36.4 C)-97.7 F (36.5 C)] 97.5 F (36.4 C) (07/21 1245) Pulse Rate:  [65-94] 65 (07/21 1245) Resp:  [14-24] 19 (07/21 1245) BP: (74-150)/(48-100) 122/80 (07/21 1245) SpO2:  [100 %] 100 % (07/21 1245) Weight:  [152 lb 12.8 oz (69.3 kg)-159 lb 2.8 oz (72.2 kg)] 159 lb 2.8 oz (72.2 kg) (07/21 1203) HEMODYNAMICS:  arrived to the ICU hypotensive  OXYGEN:  4 liters [place]  INTAKE / OUTPUT:  Intake/Output Summary (Last 24 hours) at 05/04/2018 1350 Last data filed at 05/04/2018 1234 Gross per 24 hour  Intake 3067.28 ml  Output 1650 ml  Net 1417.28 ml       PHYSICAL EXAM   Physical Exam  Constitutional: She is oriented to person, place, and time. She appears well-developed.  HENT:  Head: Normocephalic.  Mouth/Throat: Oropharynx is clear and moist.  Eyes: Pupils are equal, round, and reactive to light. Conjunctivae and EOM are normal. No scleral icterus.  Neck: Normal range of motion. Neck supple. No tracheal deviation present.  Cardiovascular: Normal rate and normal heart sounds. Exam reveals no gallop.  No murmur heard. Pulmonary/Chest: Clear to auscultation, no wheezing or rhonchi  Abdominal: soft, generalized tenderness with inaudible bowel sounds. Musculoskeletal: Normal range of motion. 1+ lower extremity oedema bilaterally Neurological: She is alert and oriented to person, place, and time. No cranial nerve deficit.  Skin: No rash noted. No erythema.  Psychiatric: Anxious     LABS   LABS:  CBC Recent Labs  Lab 04/29/18 0605 05/01/18 0510 05/02/18 0422 05/04/18 0828 05/04/18 1101  WBC 5.0 5.8 6.9  --   --   HGB 8.4* 8.0* 8.6* 5.2* 5.2*  HCT 24.6* 24.1* 25.0*  --  15.9*  PLT 267 288 298  --   --    Coag's Recent Labs  Lab 04/28/18 1508  04/30/18 0137 04/30/18 2200 05/01/18 0510  APTT 37*   < > 98* 63* 80*  INR 1.29   --   --   --   --    < > = values in this interval not displayed.   BMET Recent Labs  Lab 05/02/18 0422 05/03/18 0635 05/04/18 0828  NA 127* 129* 126*  K 4.3 3.4* 3.5  CL 99 97* 95*  CO2 19* 24 25  BUN 13 13 21   CREATININE 0.78 0.92 0.98  GLUCOSE 99 116* 123*   Electrolytes Recent Labs  Lab 05/02/18 0422 05/03/18 0635 05/04/18 0828  CALCIUM 8.1* 8.4* 7.5*  MG 1.9 1.5* 1.4*  PHOS 3.1 2.7 3.8   Sepsis Markers No results for input(s): LATICACIDVEN, PROCALCITON, O2SATVEN in the last 168 hours. ABG No results for input(s): PHART, PCO2ART, PO2ART in the last 168 hours. Liver Enzymes Recent Labs  Lab 05/02/18 0422 05/03/18 0635 05/04/18 0828  AST 88* 72*  --   ALT 127* 110*  --   ALKPHOS 112 97  --   BILITOT 1.6* 1.3*  --   ALBUMIN 3.0* 3.1* 2.0*   Cardiac Enzymes No results for input(s): TROPONINI, PROBNP in the last 168 hours. Glucose Recent Labs  Lab 05/03/18 1939 05/03/18 2326 05/04/18 0343 05/04/18 0801 05/04/18 1137 05/04/18 1205  GLUCAP 145* 142* 134* 117* 81 84     Recent Results (from the past 240 hour(s))  C difficile quick scan w PCR reflex     Status: None   Collection Time: 04/27/18  2:36 PM  Result Value Ref Range Status   C Diff antigen NEGATIVE NEGATIVE Final   C Diff toxin NEGATIVE NEGATIVE Final   C Diff interpretation No C. difficile detected.  Final    Comment: Performed at [facility], [address] [place], [place] [address]     Current Facility-Administered Medications:  .  Marland KitchenTPN (CLINIMIX-E) Adult, , Intravenous, Continuous TPN, Epifanio Lesches, MD, Stopped at 05/04/18 0944 .  Marland KitchenTPN (CLINIMIX-E) Adult, , Intravenous, Continuous TPN, Vianne Bulls, Sne[place], MD .  0.9 %  sodium chloride infusion (Manually program via Guardrails IV Fluids), , Intravenous, Once, Herbert Pun, MD .  acetaminophen (TYLENOL) tablet 650 mg, 650 mg, Oral, Q6H PRN, 650 mg at  05/04/18 0944 **OR** acetaminophen (TYLENOL) suppository  650 mg, 650 mg, Rectal, Q6H PRN, Epifanio Lesches, MD .  citalopram (CELEXA) tablet 20 mg, 20 mg, Oral, Daily, Epifanio Lesches, MD, 20 mg at 05/04/18 0850 .  famotidine (PEPCID) IVPB 20 mg premix, 20 mg, Intravenous, Q12H, [name] A, MD .  Fat emulsion 20 % infusion 250 mL, 250 mL, Intravenous, Continuous TPN, Epifanio Lesches, MD .  folic acid (FOLVITE) tablet 1 mg, 1 mg, Oral, Daily, Epifanio Lesches, MD, 1 mg at 05/04/18 1253 .  insulin aspart (novoLOG) injection 0-9 Units, 0-9 Units, Subcutaneous, Q4H, Epifanio Lesches, MD, 1 Units at 05/04/18 0351 .  ipratropium-albuterol (DUONEB) 0.5-2.5 (3) MG/3ML nebulizer solution 3 mL, 3 mL, Nebulization, Q4H PRN, Epifanio Lesches, MD, 3 mL at 05/03/18 0345 .  ipratropium-albuterol (DUONEB) 0.5-2.5 (3) MG/3ML nebulizer solution 3 mL, 3 mL, Nebulization, BID, Vianne Bulls, Snehalatha, MD .  magnesium oxide (MAG-OX) tablet 400 mg, 400 mg, Oral, Daily, Epifanio Lesches, MD, 400 mg at 05/04/18 0850 .  mometasone-formoterol (DULERA) 200-5 MCG/ACT inhaler 2 puff, 2 puff, Inhalation, BID, Epifanio Lesches, MD, 2 puff at 05/04/18 0850 .  ondansetron (ZOFRAN) tablet 4 mg, 4 mg, Oral, Q6H PRN **OR** ondansetron (ZOFRAN) injection 4 mg, 4 mg, Intravenous, Q6H PRN, Epifanio Lesches, MD, 4 mg at 05/04/18 1036 .  sodium chloride flush (NS) 0.9 % injection 10-40 mL, 10-40 mL, Intracatheter, Q12H, Epifanio Lesches, MD, 10 mL at 05/04/18 1048 .  sodium chloride flush (NS) 0.9 % injection 10-40 mL, 10-40 mL, Intracatheter, PRN, Epifanio Lesches, MD .  sodium phosphate (FLEET) 7-19 GM/118ML enema 1 enema, 1 enema, Rectal, Once, Epifanio Lesches, MD  IMAGING     MAJOR EVENTS/TEST RESULTS: 7/21 transferred to ICU  INDWELLING DEVICES:: Peripheral IVs 7/21 [place] MICRO DATA: MRSA PCR negative    ANTIMICROBIALS: Nil   nonnonee  ASSESSMENT/PLAN   1. Acute hypovolemic/ hemorrhagic shock secondary to acute blood  loss anemia 2. Acute blood loss anemia from acute lower GI bleed 3. Acute lower GI bleed likely from ischemic colitis 4. Ischemic colitis. 5. Moderate protein malnutrition on TPN 6. Electrolytes abnormalities- hypokalemia, hypomagnesemia, hyponatremia 7. Newly diagnosed Atrial fibrillation had been on beta blocker 8. Chronic systolic dysfunction CHF with EF 35-40% 9. Right hilar mass/ lung cancer  Plan: 1. Surgical exploration- exploratory laparotomy with possible ileostomy or left open to be returned to OR in AM.  2. Patient transfused 2 units of PRBC emergently with 4 units being made available via type and xmatch 3. Electrolytes correction 4. Pepcid/ SCD 5. Stop all antihypertensive meds. 6. Pain control  Family:  Patient;s granddaughter updated.  Thank you for allowing me the privilege to care for this patient.  I have dedicated a total of 40 minutes in critical care time minus all appropriate exclusions.     [name], M.D

## 2018-05-04 NOTE — Progress Notes (Signed)
Danielle Antigua, MD [address] [place], [place], [place], [address] [address], [place], [place], [place], [address] Phone: [phone]  Fax: [phone]   Subjective: Patient had 3 episodes of hematochezia this morning.  No nausea or vomiting.   Objective: Exam: Vital signs in last 24 hours: Vitals:   05/04/18 0524 05/04/18 0544 05/04/18 0802 05/04/18 0858  BP:  (!) 129/51 101/61   Pulse:  76 69   Resp:  20 14   Temp:  97.7 F (36.5 C)    TempSrc:  Oral    SpO2:  100% 100% 100%  Weight: 152 lb 12.8 oz (69.3 kg)     Height:       Weight change: -1 lb 1.6 oz (-0.499 kg)  Intake/Output Summary (Last 24 hours) at 05/04/2018 1115 Last data filed at 05/04/2018 6789 Gross per 24 hour  Intake 2524.28 ml  Output 2650 ml  Net -125.72 ml    General: No acute distress, AAO x3 Abd: Soft, NT/ND, No HSM Skin: Warm, no rashes Neck: Supple, Trachea midline   Lab Results: Lab Results  Component Value Date   WBC 6.9 05/02/2018   HGB 5.2 (L) 05/04/2018   HCT 25.0 (L) 05/02/2018   MCV 102.4 (H) 05/02/2018   PLT 298 05/02/2018   Micro Results: Recent Results (from the past 240 hour(s))  C difficile quick scan w PCR reflex     Status: None   Collection Time: 04/27/18  2:36 PM  Result Value Ref Range Status   C Diff antigen NEGATIVE NEGATIVE Final   C Diff toxin NEGATIVE NEGATIVE Final   C Diff interpretation No C. difficile detected.  Final    Comment: Performed at [facility], [address] [place], [place] [address]   Studies/Results: Dg Chest Port 1 View  Result Date: 05/02/2018 CLINICAL DATA:  SOB onset.  Denies chest pain. EXAM: PORTABLE CHEST 1 VIEW COMPARISON:  04/21/2018. FINDINGS: Prominent RIGHT hilar region is redemonstrated. RIGHT upper lobe scarring/subsegmental atelectasis may be slightly more prominent. Cardiomegaly. Emphysematous change. BILATERAL pleural effusions. IMPRESSION: Slight increased prominence of subsegmental atelectasis  or scarring RIGHT upper lobe, but no evidence of increased size RIGHT hilar mass. Electronically Signed   By: Staci Righter M.D.   On: 05/02/2018 12:48   Medications:  Scheduled Meds: . citalopram  20 mg Oral Daily  . feeding supplement  1 Container Oral TID BM  . folic acid  1 mg Oral Daily  . furosemide  20 mg Oral BID  . insulin aspart  0-9 Units Subcutaneous Q4H  . ipratropium-albuterol  3 mL Nebulization TID  . losartan  25 mg Oral Daily  . magnesium oxide  400 mg Oral Daily  . metoprolol succinate  150 mg Oral Daily  . mometasone-formoterol  2 puff Inhalation BID  . sodium chloride flush  10-40 mL Intracatheter Q12H  . sodium phosphate  1 enema Rectal Once  . thiamine  100 mg Oral Daily   Continuous Infusions: . Marland KitchenTPN (CLINIMIX-E) Adult Stopped (05/04/18 0944)  . Marland KitchenTPN (CLINIMIX-E) Adult    . Fat emulsion    . magnesium sulfate 1 - 4 g bolus IVPB 4 g (05/04/18 1103)   PRN Meds:.acetaminophen **OR** acetaminophen, bisacodyl, bisacodyl, dextrose, HYDROcodone-acetaminophen, ipratropium-albuterol, morphine injection, ondansetron **OR** ondansetron (ZOFRAN) IV, senna-docusate, sodium chloride flush   Assessment: Active Problems:   Malignant neoplasm of upper lobe of right lung (HCC)   Acute hypoxemic respiratory failure (HCC)   Malnutrition of moderate degree  Palliative care by specialist   DNR (do not resuscitate) discussion   Weakness generalized   COPD exacerbation ([place])   Generalized abdominal pain   Colonic thickening   Ulceration of intestine   Abdominal pain, right upper quadrant   Diverticulosis of large intestine without diverticulitis   Abnormal CT scan, colon    Plan: Her source of hematochezia that occurred this morning is from her circumferential ulcerated area at the hepatic flexure This is not amenable to endoscopic treatment Would recommend surgical evaluation due to ongoing hematochezia from hepatic flexure ulcerated mucosa. Discussed with Dr.  Vianne Bulls Continue serial CBCs and transfuse PRN Primary care team has held Eliquis at this time     LOS: 13 days   Danielle Antigua, MD 05/04/2018, 11:15 AM

## 2018-05-04 NOTE — Anesthesia Preprocedure Evaluation (Addendum)
Anesthesia Evaluation  Patient identified by MRN, date of birth, ID band Patient awake    Reviewed: Allergy & Precautions, H&P , NPO status , Patient's Chart, lab work & pertinent test results, reviewed documented beta blocker date and time   Airway Mallampati: II  TM Distance: >3 FB Neck ROM: full    Dental  (+) Teeth Intact   Pulmonary neg pulmonary ROS, shortness of breath, COPD,  COPD inhaler, former smoker,    Pulmonary exam normal        Cardiovascular Exercise Tolerance: Poor hypertension, On Medications +CHF and + DOE  negative cardio ROS Normal cardiovascular exam+ Valvular Problems/Murmurs  Rhythm:regular Rate:Normal     Neuro/Psych PSYCHIATRIC DISORDERS Anxiety Depression negative neurological ROS  negative psych ROS   GI/Hepatic negative GI ROS, Neg liver ROS, PUD,   Endo/Other  negative endocrine ROSdiabetes, Well Controlled, Type 2, Oral Hypoglycemic Agents  Renal/GU negative Renal ROS  negative genitourinary   Musculoskeletal  (+) Arthritis ,   Abdominal   Peds  Hematology negative hematology ROS (+)   Anesthesia Other Findings Past Medical History: No date: Anxiety No date: Arthritis left : Cancer ([place])     Comment:  breast cancer 2000, chemo tx's with total mastectomy and              lymph nodes resected.  02/21/2016: Cancer of right lung ([place])     Comment:  rad tx's.  No date: CHF (congestive heart failure) (HCC) No date: COPD (chronic obstructive pulmonary disease) (HCC) No date: Dependence on supplemental oxygen No date: Depression No date: Diabetes mellitus without complication (HCC) No date: Heart murmur No date: Hypertension No date: Lung nodule No date: Lymphedema No date: Shortness of breath dyspnea     Comment:  with exertion 2001: Status post chemotherapy     Comment:  left breast cancer 2001: Status post radiation therapy     Comment:  left breast cancer Past Surgical  History: No date: Breast Biospy; Left     Comment:  [facility] No date: BREAST SURGERY 04/30/2018: COLONOSCOPY; N/A     Comment:  Procedure: COLONOSCOPY;  Surgeon: Virgel Manifold,               MD;  Location: [facility] ENDOSCOPY;  Service: Endoscopy;                Laterality: N/A; No date: DILATION AND CURETTAGE OF UTERUS 04/11/2016: ELECTROMAGNETIC NAVIGATION BROCHOSCOPY; Right     Comment:  Procedure: ELECTROMAGNETIC NAVIGATION BRONCHOSCOPY;                Surgeon: Vilinda Boehringer, MD;  Location: [facility] ORS;                Service: Cardiopulmonary;  Laterality: Right; No date: history of colonoscopy] No date: LUNG BIOPSY No date: MASTECTOMY; Left     Comment:  2000, [facility] No date: ROTATOR CUFF REPAIR; Right     Comment:  [facility] BMI    Body Mass Index:  25.69 kg/m     Reproductive/Obstetrics negative OB ROS                            Anesthesia Physical Anesthesia Plan  ASA: III and emergent  Anesthesia Plan: General ETT   Post-op Pain Management:    Induction:   PONV Risk Score and Plan: 4 or greater  Airway Management Planned:   Additional Equipment:   Intra-op Plan:   Post-operative Plan:  Informed Consent: I have reviewed the patients History and Physical, chart, labs and discussed the procedure including the risks, benefits and alternatives for the proposed anesthesia with the patient or authorized representative who has indicated his/her understanding and acceptance.   Dental Advisory Given  Plan Discussed with: CRNA  Anesthesia Plan Comments:         Anesthesia Quick Evaluation

## 2018-05-04 NOTE — Significant Event (Signed)
Rapid Response Event Note  Overview: Time Called: 7011 Arrival Time: 1816 Event Type: Respiratory  Initial Focused Assessment: called for rapid response to PACU for distress. Pt in need of reintubation.   Interventions: Dr Andree Elk, and CRNA reintubated pt.  Plan of Care (if not transferred):  Event Summary: PT reintubated in PACU, and will be transferred back to ICU on vent. Name of Physician Notified: [name] (surgery) Adams (anesthesia) at 1815    at    Outcome: Transferred (Comment)(back to icu )  Event End Time: [place]

## 2018-05-04 NOTE — Progress Notes (Signed)
[facility] [place] NAME: Danielle [name]    MR#:  [id number]  DATE OF BIRTH:  10/13/1956  3 episodes of hematochezia this morning, patient is lethargic, hypotensive, hemoglobin dropped to 5.2 from 8.6.  CHIEF COMPLAINT:   Chief Complaint  Patient presents with  . Shortness of Breath  Patient appears lethargic but opens eyes to calling her name but does not talk like yesterday.  She complained of abdominal pain to the registered nurse earlier.  REVIEW OF SYSTEMS:    DRUG ALLERGIES:  No Known Allergies VITALS:  Blood pressure (!) 74/48, pulse 67, temperature 97.7 F (36.5 C), temperature source Oral, resp. rate 14, height 5\' 6"  (1.676 m), weight 69.3 kg (152 lb 12.8 oz), SpO2 100 %. PHYSICAL EXAMINATION:  Physical Exam  Constitutional: She is oriented to person, place, and time. She appears well-developed.  HENT:  Head: Normocephalic.  Mouth/Throat: Oropharynx is clear and moist.  Eyes: Pupils are equal, round, and reactive to light. Conjunctivae and EOM are normal. No scleral icterus.  Neck: Normal range of motion. Neck supple. No tracheal deviation present.  Cardiovascular: Normal rate and normal heart sounds. Exam reveals no gallop.  No murmur heard. Pulmonary/Chest: Effort normal. No stridor. No respiratory distress. She has no wheezes. She has no rales.  Abdominal: Bowel sounds are normal. She exhibits distension. There is tenderness. There is no rebound.  Musculoskeletal: Normal range of motion. She exhibits edema. She exhibits no tenderness.  Bilateral leg edema  Neurological: She is alert and oriented to person, place, and time. No cranial nerve deficit.  Skin: No rash noted. No erythema.  Psychiatric: She has a normal mood and affect.  Lethargic   LABORATORY PANEL:  Female CBC Recent Labs  Lab 05/02/18 0422  05/04/18 1101  WBC 6.9  --   --   HGB 8.6*   < > 5.2*  HCT 25.0*  --  15.9*  PLT 298  --   --    < > =  values in this interval not displayed.   ------------------------------------------------------------------------------------------------------------------ Chemistries  Recent Labs  Lab 05/03/18 0635 05/04/18 0828  NA 129* 126*  K 3.4* 3.5  CL 97* 95*  CO2 24 25  GLUCOSE 116* 123*  BUN 13 21  CREATININE 0.92 0.98  CALCIUM 8.4* 7.5*  MG 1.5* 1.4*  AST 72*  --   ALT 110*  --   ALKPHOS 97  --   BILITOT 1.3*  --    RADIOLOGY:  No results found. ASSESSMENT AND PLAN:   11F acute hypoxemic respiratory failure, acute COPD exacerbation, dehydration, hypokalemia, hypoglycemia, renal failure, hypocalcemia, hypoalbuminemia, transaminasemia/hyperbilirubinemia, BNP elevation, Troponin-I elevation, macrocytic anemia.  Hemorrhagic shock;Patient had 3 episodes of hematochezia, hemoglobin dropped to 5.2 and she is also hypotensive.  Transfer the patient this is a stepdown,  getting emergency blood transfusion,, discussed with patient''s daughter Danielle [name]. will came to the hospital. I spoke with Dr. Bonna Gains, Dr. Delfino Lovett and Dr. Windell Moment. Because of ischemic colitis, ulcerated hepatic flexure with ongoing GI bleed patient needs emergency surgery and possible colectomy.  Discussed with surgeon, GI, patient daughter.     Acute on chronic respiratory failure with hypoxia and hypercapnia due to COPD exacerbation, radiation pneumonitis/fibrosis and CHF. The patient is off BiPAP, on oxygen by nasal cannula 2L (home O2). Continue nebulizer and DuoNeb as needed, completed Zithromax, discontinued IV Solu-Medrol, changed to prednisone and completed. continue bronchodilators, oxygen.  New onset A. fib with RVR. Hypotensive today morning  so hold all BP medicines, Eliquis.   Elevated troponin due to demanding ischemia.  Acute on chronic systolic CHF LV EF: 46% - 40%. ` Hypokalemia. She was given potassium supplement and improved.  Hyperkalemia.  improved.    Acute renal failure due to ATN  and dehydration. Improved with normal saline IV.   Hypomagnesemia and hypophosphatemia. Improved with supplement. Hypomagnesemia.  Mg 1.3. Given magnesium IV and improved.  Moderate Malnutritionrelated to chronic illness. Started TPN due to ischemic colitis.  Ischemic colitis. The patient was initially suspected due to constipation.  She still has abdominal pain after large bowel movement. Abdominal CAT scan: Focal area of irregular wall thickening and relative narrowing of the right colon at and just below the hepatic flexure extending over approximately 7-8 cm.  Pain control. Per Dr. [name], the patient can get colonoscopy, may stop Eliquis 1 to 2 days prior to endoscopy to lower bleeding potential and complication risk and restart as soon as possible thereafter due to patient maintaining normal sinus rhythm. Colonoscopy: The ulcerated mucosa may be due to ischemia.  Sigmoid diverticulosis. Per Dr. Bonna Gains,     CT angiogram shows patent IMA and SMA so large vessel disease is likely not the cause of the colitis.  As such, no role for revascularization or other vascular surgery intervention at this time per Dr. Lucky Cowboy.  Liver function test is unremarkable. Abdominal ultrasound shows severe hepatic steatosis.  Hyponatremia.  ,  Sodium down to 126 again.  Continue aggressive hydration.  Stage I x2 lung cancer status post radiation;remote history of breast cancer.Follow-up of Dr. Rogue Bussing as outpatient.  Condition at this time is critical, high risk for cardiac arrest, prognosis poor, discussed the plan with ICU physician Dr. Delfino Lovett, surgeon on-call Dr. Salvadore Oxford daughter Danielle [name].  All the records are reviewed and case discussed with Care Management/Social Worker. Management plans discussed with the patient, her daughter and they are in agreement.   CODE STATUS: Full Code  TOTAL TIME TAKING CARE OF THIS PATIENT: 43 minutes.   More than 50% of the time was  spent in counseling/coordination of care: YES  POSSIBLE D/C IN >3 DAYS, DEPENDING ON CLINICAL CONDITION.   Epifanio Lesches M.D on 05/04/2018 at 11:29 AM  Between 7am to 6pm - Pager - [phone]  After 6pm go to [email or web address] - [facility]

## 2018-05-04 NOTE — Consult Note (Signed)
PHARMACY - ADULT TOTAL PARENTERAL NUTRITION CONSULT NOTE   Pharmacy Consult for TPN Indication: w/out adequate nutrition for > 7 days  Patient Measurements: Height: 5\' 6"  (167.6 cm) Weight: 152 lb 12.8 oz (69.3 kg) IBW/kg (Calculated) : 59.3 TPN AdjBW (KG): 64 Body mass index is 24.66 kg/m. Usual Weight:   Assessment:   GI: clear liquid diet Endo:  Insulin requirements in the past 24 hours: 7 units Lytes:K=3.8, Mg=1.4, phos=3.8 Renal: SCr 0.98 Pulm: Cards:  Hepatobil: Neuro: ID:  TPN Access: ordered 7/19 TPN start date: Nutritional Goals (per RD recommendation on 7/19): APOL:4103 Protein: 47 Fluid:1800  Goal TPN rate is 65 ml/hr  Current Nutrition:   Plan:  Clinimix E 5/15 @ 26ml/hr and 20% lipids @ 30ml/hr x 12 hr Add multivit and trace elements SSI sensitive q 4 hr for now, will decrease in little SSI is needed Daily weights, strict in and outs Monitor closely w/ CHF- potentially fluid overloaded Ordered Mag 4 g IV x 1. Will check K, Mg, Phos daily x 3 days TG, LFT, albumin, CBC weekly Follow up tomorrow AM  Olivia Canter, [facility] Pharmacist 05/04/2018,10:03 AM

## 2018-05-04 NOTE — Progress Notes (Signed)
Patients hemoglobin resulted at 5.2, BP is 101/61, CCMD notified this RN that patients QTc has increased on telemetry. MD notified, order to be placed for blood transfusion and order to hold losartan, eliquis, and metoprolol. Will continue to assess and monitor.

## 2018-05-04 NOTE — Anesthesia Post-op Follow-up Note (Signed)
Anesthesia QCDR form completed.        

## 2018-05-05 ENCOUNTER — Encounter: Payer: Self-pay | Admitting: General Surgery

## 2018-05-05 ENCOUNTER — Inpatient Hospital Stay: Payer: Medicaid Other

## 2018-05-05 DIAGNOSIS — C3411 Malignant neoplasm of upper lobe, right bronchus or lung: Secondary | ICD-10-CM

## 2018-05-05 DIAGNOSIS — Z9889 Other specified postprocedural states: Secondary | ICD-10-CM

## 2018-05-05 DIAGNOSIS — Z9911 Dependence on respirator [ventilator] status: Secondary | ICD-10-CM

## 2018-05-05 DIAGNOSIS — G9340 Encephalopathy, unspecified: Secondary | ICD-10-CM

## 2018-05-05 LAB — COMPREHENSIVE METABOLIC PANEL
ALT: 49 U/L — ABNORMAL HIGH (ref 0–44)
AST: 48 U/L — ABNORMAL HIGH (ref 15–41)
Albumin: 1.9 g/dL — ABNORMAL LOW (ref 3.5–5.0)
Alkaline Phosphatase: 59 U/L (ref 38–126)
Anion gap: 6 (ref 5–15)
BUN: 23 mg/dL (ref 8–23)
CO2: 25 mmol/L (ref 22–32)
Calcium: 6.7 mg/dL — ABNORMAL LOW (ref 8.9–10.3)
Chloride: 103 mmol/L (ref 98–111)
Creatinine, Ser: 1.17 mg/dL — ABNORMAL HIGH (ref 0.44–1.00)
GFR calc Af Amer: 56 mL/min — ABNORMAL LOW (ref >60)
GFR calc non Af Amer: 48 mL/min — ABNORMAL LOW (ref >60)
Glucose, Bld: 171 mg/dL — ABNORMAL HIGH (ref 70–99)
Potassium: 3.5 mmol/L (ref 3.5–5.1)
Sodium: 134 mmol/L — ABNORMAL LOW (ref 135–145)
Total Bilirubin: 1.1 mg/dL (ref 0.3–1.2)
Total Protein: 3.9 g/dL — ABNORMAL LOW (ref 6.5–8.1)

## 2018-05-05 LAB — CBC
HEMATOCRIT: 22 % — AB (ref 35.0–47.0)
Hemoglobin: 7.6 g/dL — ABNORMAL LOW (ref 12.0–16.0)
MCH: 31.5 pg (ref 26.0–34.0)
MCHC: 34.4 g/dL (ref 32.0–36.0)
MCV: 91.5 fL (ref 80.0–100.0)
Platelets: 189 10*3/uL (ref 150–440)
RBC: 2.4 MIL/uL — ABNORMAL LOW (ref 3.80–5.20)
RDW: 21.9 % — ABNORMAL HIGH (ref 11.5–14.5)
WBC: 30.9 10*3/uL — ABNORMAL HIGH (ref 3.6–11.0)

## 2018-05-05 LAB — GLUCOSE, CAPILLARY
GLUCOSE-CAPILLARY: 140 mg/dL — AB (ref 70–99)
GLUCOSE-CAPILLARY: 148 mg/dL — AB (ref 70–99)
GLUCOSE-CAPILLARY: 181 mg/dL — AB (ref 70–99)
Glucose-Capillary: 134 mg/dL — ABNORMAL HIGH (ref 70–99)
Glucose-Capillary: 156 mg/dL — ABNORMAL HIGH (ref 70–99)

## 2018-05-05 LAB — HEMOGLOBIN AND HEMATOCRIT, BLOOD
HCT: 21.9 % — ABNORMAL LOW (ref 35.0–47.0)
HCT: 20.3 % — ABNORMAL LOW (ref 35.0–47.0)
HCT: 25.2 % — ABNORMAL LOW (ref 35.0–47.0)
HCT: 24.2 % — ABNORMAL LOW (ref 35.0–47.0)
HEMOGLOBIN: 8.5 g/dL — AB (ref 12.0–16.0)
Hemoglobin: 7.5 g/dL — ABNORMAL LOW (ref 12.0–16.0)
Hemoglobin: 6.9 g/dL — ABNORMAL LOW (ref 12.0–16.0)
Hemoglobin: 8.2 g/dL — ABNORMAL LOW (ref 12.0–16.0)

## 2018-05-05 LAB — TRIGLYCERIDES: Triglycerides: 295 mg/dL — ABNORMAL HIGH (ref <150)

## 2018-05-05 LAB — PROCALCITONIN: PROCALCITONIN: 1.7 ng/mL

## 2018-05-05 LAB — MAGNESIUM: Magnesium: 2.2 mg/dL (ref 1.7–2.4)

## 2018-05-05 LAB — PREPARE RBC (CROSSMATCH)

## 2018-05-05 LAB — PHOSPHORUS: Phosphorus: 4.2 mg/dL (ref 2.5–4.6)

## 2018-05-05 LAB — PREALBUMIN: Prealbumin: 7.2 mg/dL — ABNORMAL LOW (ref 18–38)

## 2018-05-05 MED ORDER — KCL-LACTATED RINGERS-D5W 20 MEQ/L IV SOLN
INTRAVENOUS | Status: AC
  Administered 2018-05-05: 12:00:00 via INTRAVENOUS
  Filled 2018-05-05: qty 1000

## 2018-05-05 MED ORDER — SODIUM CHLORIDE 0.9% IV SOLUTION
Freq: Once | INTRAVENOUS | Status: AC
  Administered 2018-05-05: 11:00:00 via INTRAVENOUS

## 2018-05-05 MED ORDER — DEXMEDETOMIDINE HCL IN NACL 400 MCG/100ML IV SOLN
0.0000 ug/kg/h | INTRAVENOUS | Status: DC
  Administered 2018-05-05 (×2): 0.4 ug/kg/h via INTRAVENOUS
  Filled 2018-05-05 (×2): qty 100

## 2018-05-05 MED ORDER — LORAZEPAM 2 MG/ML IJ SOLN
INTRAMUSCULAR | Status: AC
  Filled 2018-05-05: qty 1

## 2018-05-05 MED ORDER — IPRATROPIUM-ALBUTEROL 0.5-2.5 (3) MG/3ML IN SOLN
3.0000 mL | RESPIRATORY_TRACT | Status: DC | PRN
  Administered 2018-05-12 – 2018-05-15 (×5): 3 mL via RESPIRATORY_TRACT
  Filled 2018-05-05 (×6): qty 3

## 2018-05-05 MED ORDER — TRACE MINERALS CR-CU-MN-SE-ZN 10-1000-500-60 MCG/ML IV SOLN
INTRAVENOUS | Status: AC
  Administered 2018-05-05: 18:00:00 via INTRAVENOUS
  Filled 2018-05-05: qty 1560

## 2018-05-05 MED ORDER — LORAZEPAM 2 MG/ML IJ SOLN
2.0000 mg | Freq: Once | INTRAMUSCULAR | Status: AC
  Administered 2018-05-05: 2 mg via INTRAVENOUS

## 2018-05-05 MED ORDER — PIPERACILLIN-TAZOBACTAM 3.375 G IVPB
3.3750 g | Freq: Three times a day (TID) | INTRAVENOUS | Status: DC
  Administered 2018-05-05 – 2018-05-08 (×9): 3.375 g via INTRAVENOUS
  Filled 2018-05-05 (×9): qty 50

## 2018-05-05 NOTE — Progress Notes (Signed)
Patient ID: Danielle [name], female   DOB: 06-Jan-1956, 62 y.o.   MRN: [id number]     [facility] Day(s): 14.   Post op day(s): 1 Day Post-Op.   Interval History: Patient seen and examined, no acute events or new complaints overnight. Patient sedated on mechanical ventilation. Unable to take history.   Vital signs in last 24 hours: [min-max] current  Temp:  [96.5 F (35.8 C)-98.7 F (37.1 C)] 98.7 F (37.1 C) (07/22 1900) Pulse Rate:  [60-89] 72 (07/22 2000) Resp:  [13-18] 16 (07/22 2000) BP: (75-158)/(49-82) 134/65 (07/22 2000) SpO2:  [82 %-100 %] 99 % (07/22 2025) FiO2 (%):  [30 %-50 %] 30 % (07/22 2025)     Height: 5\' 6"  (167.6 cm) Weight: 72.2 kg (159 lb 2.8 oz) BMI (Calculated): 25.7   Physical Exam:  Constitutional: alert, cooperative and no distress   Gastrointestinal: soft,  non-distended, ostomy pink and patent. There is dark old blood in ostomy bag.   Labs:  CBC Latest Ref Rng & Units 05/05/2018 05/05/2018 05/05/2018  WBC 3.6 - 11.0 K/uL - - 30.9(H)  Hemoglobin 12.0 - 16.0 g/dL 8.5(L) 6.9(L) 7.6(L)  Hematocrit 35.0 - 47.0 % 25.2(L) 20.3(L) 22.0(L)  Platelets 150 - 440 K/uL - - 189   CMP Latest Ref Rng & Units 05/05/2018 05/04/2018 05/04/2018  Glucose 70 - 99 mg/dL 171(H) 151(H) 123(H)  BUN 8 - 23 mg/dL 23 21 21   Creatinine 0.44 - 1.00 mg/dL 1.17(H) 1.01(H) 0.98  Sodium 135 - 145 mmol/L 134(L) 130(L) 126(L)  Potassium 3.5 - 5.1 mmol/L 3.5 3.6 3.5  Chloride 98 - 111 mmol/L 103 101 95(L)  CO2 22 - 32 mmol/L 25 21(L) 25  Calcium 8.9 - 10.3 mg/dL 6.7(L) 7.1(L) 7.5(L)  Total Protein 6.5 - 8.1 g/dL 3.9(L) 4.8(L) -  Total Bilirubin 0.3 - 1.2 mg/dL 1.1 1.4(H) -  Alkaline Phos 38 - 126 U/L 59 73 -  AST 15 - 41 U/L 48(H) 57(H) -  ALT 0 - 44 U/L 49(H) 70(H) -    Assessment/Plan:  62 y.o. female with bleeding hepatic flexure ischemic colitis status post right hemicolectomy with end ileostomy and mucus fistula POD #1, complicated by pertinent comorbidities  including afib on anticoagulation, HTN,severe COPDon home supplemental oxygen prn,Right lung cancer s/p radiation with known Right hilar mass, cardiac murmur, lymphedema, generalized anxiety disorder, major depression disorder, and history of Left breast cancer s/p mastectomy and chemoradiation.  Patient continue critically ill on mechanical ventilation due to respiratory failure, on vasopressors. New hemoglobin level after one PRBC up to 8.5 which is an adequate increase. There is blood on her ostomy bag which may be worrisome to upper GI bleeding. If patient drops hemoglobin again or becomes unstable, upper GI bleeding workup is recommended. Ileostomy still not working properly. Agree with TPN and current critical care management.    Arnold Long, MD

## 2018-05-05 NOTE — Consult Note (Signed)
PHARMACY - ADULT TOTAL PARENTERAL NUTRITION CONSULT NOTE   Pharmacy Consult for TPN Indication: w/out adequate nutrition for > 7 days  Patient Measurements: Height: 5\' 6"  (167.6 cm) Weight: 159 lb 2.8 oz (72.2 kg) IBW/kg (Calculated) : 59.3 TPN AdjBW (KG): 72.2 Body mass index is 25.69 kg/m. Usual Weight:   Assessment:   GI:  Endo:  Insulin requirements in the past 24 hours: 8 units Lytes:  Sodium (mmol/L)  Date Value  05/05/2018 134 (L)  02/09/2015 132 (L)   Potassium (mmol/L)  Date Value  05/05/2018 3.5  02/09/2015 3.8   Magnesium (mg/dL)  Date Value  05/05/2018 2.2  12/07/2013 1.8   Phosphorus (mg/dL)  Date Value  05/05/2018 4.2   Calcium (mg/dL)  Date Value  05/05/2018 6.7 (L)   Calcium, Total (mg/dL)  Date Value  02/09/2015 9.1   Albumin (g/dL)  Date Value  05/05/2018 1.9 (L)  02/09/2015 4.3    Renal: SCr 0.98 Pulm: Cards:  Hepatobil: Neuro: ID:  TPN Access: ordered 7/19 TPN start date: Nutritional Goals (per RD recommendation on 7/19): HNGI:7195 Protein: 43 Fluid:1800  Goal TPN rate is 65 ml/hr  Current Nutrition:   Plan:  Clinimix E 5/15 @ 77ml/hr, no lipids Add multivit and trace elements SSI sensitive q 4 hr for now Daily weights, strict in and outs Monitor closely w/ CHF- potentially fluid overloaded Will check K, Mg, Phos daily x 3 days TG, LFT, albumin, CBC weekly Follow up tomorrow AM  Napoleon Form, [facility] Clinical Pharmacist 05/05/2018,12:34 PM

## 2018-05-05 NOTE — Progress Notes (Signed)
Nutrition Follow-up  DOCUMENTATION CODES:   Non-severe (moderate) malnutrition in context of chronic illness  INTERVENTION:  Resume Clinimix E 5/15 at 65 mL/hr tonight. Provides 1108 kcal, 78 grams of protein, 1560 mL fluid daily.  Withhold lipids for 7 days while critically ill per ASPEN/SCCM guidelines.  Continue adult MVI and trace elements as daily TPN additives.  Continue daily weights.  NUTRITION DIAGNOSIS:   Moderate Malnutrition related to chronic illness(hx breast cancer, CHF, COPD) as evidenced by mild fat depletion, moderate fat depletion, mild muscle depletion, moderate muscle depletion.  Ongoing - addressing with TPN.  GOAL:   Provide needs based on ASPEN/SCCM guidelines  Met with TPN.  MONITOR:   PO intake, Supplement acceptance, Labs, Weight trends, I & O's  REASON FOR ASSESSMENT:   Malnutrition Screening Tool, Consult Assessment of nutrition requirement/status  ASSESSMENT:   62 year old female with PMHx of arthritis, HTN, COPD, anxiety, depression, CHF, breast cancer s/p left mastectomy and chemotherapy/XRT, lymphedema who is now admitted with severe hypoxic and hypercapnic respiratory failure initially requiring BiPAP, CHF exacerbation, radiation pneumonitis/fibrosis.   -On evening of 7/20 patient had 3 large bloody stools. Hgb dropped to 5.2 and patient received 2 units pRBC emergently. Developed hypovolemic/hemorrhagic shock secondary to acute blood loss anemia and was transferred to ICU. -Underwent exploratory laparotomy, right hemicolectomy with end ileostomy and mucus fistula on 7/21 in setting of hepatic flexure bleeding ulcer. Patient was initially extubated following operation but developed acute respiratory failure requiring re-intubation 7/21.  Patient intubated and sedated. On PRVC mode with FiO2 30%. No TPN running at this time. TPN was ordered so may have not been hung as patient was in OR and then being stabilized after requiring  re-intubation.   Enteral Access: NGT placed 7/21; terminates in distal stomach per abdominal x-ray 7/21: 78 cm at left nare; currently to LIS  MAP: 61-95 mmHg  IV Access: right brachial double lumen PICC placed 7/19; ECG technology used to confirm PICC terminates at [facility]  Patient is currently intubated on ventilator support Ve: 7.3 L/min Temp (24hrs), Avg:97.7 F (36.5 C), Min:96.5 F (35.8 C), Max:98.7 F (37.1 C)  Propofol: N/A  Medications reviewed and include: Novolog 0-9 units Q4hrs, Precedex gtt, D5LR at 50 mL/hr (60 grams dextrose, 204 kcal daily) scheduled to run until 2329 tonight, famotidine, fentanyl gtt, norepinephrine gtt at 3 mcg/min, Zosyn.  Labs reviewed: CBG 81-148 past 24 hrs, Sodium 134, Creatinine 1.17, Calcium 6.7 (corrects to 8.38 with Albumin 1.9), Triglycerides 295.  I/O: 1125 mL UOP yesterday (0.7 mL/kg/hr)  Weight trend: 72.2 kg on 7/21; +12 kg from admission wt of 60.2 kg  Discussed with RN and on rounds.  Diet Order:   Diet Order           Diet NPO time specified  Diet effective now        Diet - low sodium heart healthy        Diet - low sodium heart healthy        Diet - low sodium heart healthy          EDUCATION NEEDS:   Education needs have been addressed  Skin:  Skin Assessment: Reviewed RN Assessment  Last BM:  05/04/2018 - large bloody BM; now with ileostomy  Height:   Ht Readings from Last 1 Encounters:  05/04/18 '5\' 6"'$  (1.676 m)    Weight:   Wt Readings from Last 1 Encounters:  05/04/18 159 lb 2.8 oz (72.2 kg)    Ideal Body  Weight:  59.1 kg  BMI:  Body mass index is 25.69 kg/m.  Estimated Nutritional Needs:   Kcal:  1336 (PSU 2003b w/ MSJ 1173, Ve 7.3, Tmax 37.1); 80% PSU=1069  Protein:  78-90 grams (1.3-1.5 grams/kg)  Fluid:  1.5-1.8 L/day (25-30 mL/kcal)  Willey Blade, MS, RD, LDN Office: [phone] Pager: [phone] After Hours/Weekend Pager: [phone]

## 2018-05-05 NOTE — Progress Notes (Signed)
CRITICAL CARE NOTE  CC  follow up respiratory failure secondary to hemorrhagic shock. POD 1 from exploratory laparotomy with right hemicolectomy.   SUBJECTIVE Patient remains critically ill Prognosis is guarded 62 year old female with respiratory failure secondary to hemorrhagic shock. Patient is POD 1 from exploratory laparotomy with right hemicolectomy for hepatic flexure ulceration. Patient continues to be intubated and sedated.   SIGNIFICANT EVENTS  7/21: patient with bloody stools. Hemoglobin 5.2, emergent 2 units of PRBC given with one liter of saline. Patient transferred to ICU and wished to proceed with surgery. Patient extubated after surgery. Rapid response called to the PACU for respiratory distress. Patient re-intubated and transferred to ICU.   7/22: Patient unable to be placed on pressure support ventilation for SBT. Patient biting on tube while on sedation. '2mg'$  of Ativan given, bite guard placed.  BP (!) 85/50   Pulse 83   Temp 98.7 F (37.1 C) (Oral)   Resp 18   Ht '5\' 6"'$  (1.676 m)   Wt 72.2 kg (159 lb 2.8 oz)   SpO2 100%   BMI 25.69 kg/m    REVIEW OF SYSTEMS  PATIENT IS UNABLE TO PROVIDE COMPLETE REVIEW OF SYSTEM S DUE TO SEVERE CRITICAL ILLNESS AND ENCEPHALOPATHY   PHYSICAL EXAMINATION:  GENERAL:critically ill appearing, +resp distress HEAD: Normocephalic, atraumatic.  EYES: Pupils equal, round, reactive to light.  No scleral icterus.  MOUTH: Moist mucosal membrane. NECK: Supple. No thyromegaly. No nodules. No JVD.  PULMONARY: +rhonchi, +wheezing CARDIOVASCULAR: S1 and S2. Regular rate and rhythm. No murmurs, rubs, or gallops.  GASTROINTESTINAL: Soft, nontender, mild distention. Diminished bowel sounds. Laparotomy incision dressed with honeycomb. 2 ostomies. Ostomy on right with bloody stool. Left ostomy with minimal brown stool.  MUSCULOSKELETAL: No swelling, clubbing, or edema.  NEUROLOGIC: obtunded, GCS<8 SKIN:intact, hands and feet cool. Unable to  palpate radial or pedal pulses.  ASSESSMENT AND PLAN SYNOPSIS  62 year old female POD 1 from exploratory laparotomy with right hemicolectomy with end ileostomy and mucus fistula for hepatic flexure ulceration. Patient continues to be intubated and sedated in the ICU. Patient with episode of biting down on tube, no history of seizures. '2mg'$  of ativan given with resolution of symptoms.   Severe Hypoxic and Hypercapnic Respiratory Failure -continue Full MV support -Wean Fio2 and PEEP as tolerated -bronchodilator treatment: Duoneb, pulmicort -will perform SAT/SBt when respiratory parameters are met -history of right middle lobe malignancy treated with radiation  -new? 3.5cm right hilar mass: needs PET scan once stable  -followed by Dr. Rogue Bussing -CXR 7/22: right pleural effusion vs. Infiltrate  -respiratory culture ordered  NEUROLOGY - intubated and sedated - minimal sedation to achieve a RASS goal: -1 -fentanyl and precedex  RENAL: -potassium 3.5: will add KCL to D5LR   GI bleed: -NG tube on continued suction -hold eliquis, SCD for DVT prophylaxis -TPN will be restarted tonight at 6pm -D5 LR with 23mq KCL interim   Hemorrhagic shock -use vasopressors to keep MAP>65: continue levophed -hemoglobin 7.6--> 6.9. Transfuse 1 unit PRBC. Recheck hemoglobin post transfusion    CARDIAC ICU monitoring -new onset afib with RVR during hospital course: hold metoprolol, eliquis, losartan. Patient is rate and rhythm controlled.  ID: -WBC 30.9: zosyn started for intra-abdominal infection and possible right infiltrate  -respiratory culture ordered. Afebrile -Procalcitonin 0.36-->1.70   GI: pepcid DVT: SCD  TRANSFUSIONS AS NEEDED MONITOR FSBS ASSESS the need for LABS as needed   Critical Care Time devoted to patient care services described in this note is 424  minutes.   Overall, patient is critically ill, prognosis is guarded.  Patient with Multiorgan failure and at high risk for  cardiac arrest and death.

## 2018-05-05 NOTE — Progress Notes (Signed)
[facility] [place] NAME: Danielle [name]    MR#:  [id number]  DATE OF BIRTH:  [birth year]  Post emergency lap with right hemicolectomy, end ileostomy, intubated, she is now on full vent support with pressors, sedation.  CHIEF COMPLAINT:   Chief Complaint  Patient presents with  . Shortness of Breath    REVIEW OF SYSTEMS:  Unable to obtain review of systems because of intubation, sedation.  DRUG ALLERGIES:  No Known Allergies VITALS:  Blood pressure (!) 147/75, pulse 66, temperature (!) 96.6 F (35.9 C), temperature source Axillary, resp. rate 16, height 5\' 6"  (1.676 m), weight 72.2 kg (159 lb 2.8 oz), SpO2 100 %. PHYSICAL EXAMINATION:  Physical Exam  Constitutional: She is oriented to person, place, and time. She appears well-nourished. She is sedated and intubated.  Critically ill.  HENT:  Head: Normocephalic.  Mouth/Throat: Oropharynx is clear and moist.  Eyes: Conjunctivae are normal. No scleral icterus.  Neck: No tracheal deviation present.  Cardiovascular: Normal rate and normal heart sounds. Exam reveals no gallop.  No murmur heard. Pulmonary/Chest: Effort normal. No stridor. She is intubated. No respiratory distress. She has no wheezes. She has no rales.  Abdominal: There is no rebound.  Status post surgery, colostomy present on the right side.  Musculoskeletal: Normal range of motion. She exhibits edema. She exhibits no tenderness.  Bilateral leg edema  Neurological: She is oriented to person, place, and time. No cranial nerve deficit.  Skin: No rash noted. No erythema.  Psychiatric:  Patient is intubated, sedated.   LABORATORY PANEL:  Female CBC Recent Labs  Lab 05/05/18 0500 05/05/18 0755  WBC 30.9*  --   HGB 7.6* 6.9*  HCT 22.0* 20.3*  PLT 189  --    ------------------------------------------------------------------------------------------------------------------ Chemistries  Recent Labs  Lab  05/05/18 0500  NA 134*  K 3.5  CL 103  CO2 25  GLUCOSE 171*  BUN 23  CREATININE 1.17*  CALCIUM 6.7*  MG 2.2  AST 48*  ALT 49*  ALKPHOS 59  BILITOT 1.1   RADIOLOGY:  Dg Abd 1 View  Result Date: 05/04/2018 CLINICAL DATA:  NG tube placement EXAM: ABDOMEN - 1 VIEW COMPARISON:  CT abdomen 05/01/2018, radiograph 04/28/2018 FINDINGS: Esophageal tube tip overlies the distal stomach. Mild to moderate gastric distention remains. Mild small bowel dilatation, possible ileus. Pleural fluid at the right base. Surgical staples over the abdomen. IMPRESSION: Esophageal tube tip overlies the distal stomach. Mild gaseous enlargement of stomach and small bowel suggestive of an ileus. Electronically Signed   By: Donavan Foil M.D.   On: 05/04/2018 19:28   Portable Chest Xray  Result Date: 05/05/2018 CLINICAL DATA:  Respiratory failure EXAM: PORTABLE CHEST 1 VIEW COMPARISON:  05/04/2018 FINDINGS: Cardiac shadow is stable. Endotracheal tube, nasogastric catheter and right-sided PICC line are again seen and stable. Persistent right-sided pleural effusion is noted but slightly increased in the interval from the prior exam likely related to the semi upright positioning. Stable changes superimposed over the right hilum and right upper lobe are noted. No new focal infiltrate is seen. IMPRESSION: Increasing right-sided effusion likely related to the semiupright positioning. Tubes and lines as described. Stable right hilar and perihilar infiltrative changes. Electronically Signed   By: Inez Catalina M.D.   On: 05/05/2018 07:17   Dg Chest Port 1 View  Result Date: 05/04/2018 CLINICAL DATA:  ETT placement EXAM: PORTABLE CHEST 1 VIEW COMPARISON:  05/02/2018, 04/21/2018, CT chest 04/21/2018 FINDINGS: Endotracheal tube  tip is about 12 mm superior to the carina. Esophageal tube tip is below the diaphragm but non included on the image. Right upper extremity catheter tip projects over the proximal right atrium. Development of  hazy right-sided thoracic opacity likely due to layering effusion. Stable cardiomediastinal silhouette with aortic atherosclerosis. Spiculated opacity in the right hilar region without significant change. IMPRESSION: 1. Endotracheal tube tip about 12 mm superior to carina. Esophageal tube tip below the diaphragm but non included on the image. Right upper extremity catheter tip projects over the proximal right atrium. 2. Development of hazy opacity in the right thorax suspected to represent layering pleural fluid. Alternatively, this could represent asymmetric edema. Spiculated opacity in the right hilar region is otherwise unchanged. Electronically Signed   By: Donavan Foil M.D.   On: 05/04/2018 18:53   ASSESSMENT AND PLAN:   47F acute hypoxemic respiratory failure, acute COPD exacerbation, dehydration, hypokalemia, hypoglycemia, renal failure, hypocalcemia, hypoalbuminemia, transaminasemia/hyperbilirubinemia, BNP elevation, Troponin-I elevation, macrocytic anemia.  Hemorrhagic shock; secondary to ischemic colitis with active bleeding from hepatic flexure ulcer and receivedmultiple units of blood transfusion yesterday, fluid resuscitation yesterday, status post emergency ex lap with right hemicolectomy, end ileostomy, mucous fistula.  Patient is critically ill with Acute respiratory failure, on full vent support.  Continue Zosyn, Ancef.  Patient also on Flagyl.  Patient is on TPN.  Continue TPN.  N.p.o. right now so she is on IV hydration. Patient hemoglobin again 6.9.  New onset A. fib with RVR. Monitor closely.Acute on chronic systolic CHF LV EF: 54% - 40%. ` Hypovolemic /septicshock: Patient is on Levophed, IV fluids, IV antibiotics.  H WBC up at 30.9.  With elevated procalcitonin up to 1.7.  Hyperkalemia.  improved.    Acute renal failure due to ATN from hemorrhagic shock: Continue IV fluids.  Creatinine is up from 1.01-1.17.  Moderate Malnutritionrelated to chronic illness. Started  TPN due to ischemic colitis.  I Hyponatremia.  ,  Improved.  History of previous lung cancer, chronic respiratory failure, history of COPD, on 2 L of oxygen all the time.  Condition at this time is critical, high risk for cardiac arrest, prognosis poor,   All the records are reviewed and case discussed with Care Management/Social Worker. Management plans discussed with the patient, her daughter and they are in agreement.   CODE STATUS: Full Code  TOTAL TIME TAKING CARE OF THIS PATIENT: 43 minutes. (critical care time)  More than 50% of the time was spent in counseling/coordination of care: YES  POSSIBLE D/C IN >3 DAYS, DEPENDING ON CLINICAL CONDITION.   Epifanio Lesches M.D on 05/05/2018 at 1:46 PM  Between 7am to 6pm - Pager - [phone]  After 6pm go to [email or web address] - [facility]

## 2018-05-05 NOTE — Progress Notes (Signed)
Pharmacy Antibiotic Note  Danielle [name] is a 62 y.o. female admitted on 04/21/2018 s/p ex-lap with hemicolectomy and ileostomy now intubated. Respiratory culture ordered.  Pharmacy has been consulted for Zosyn dosing for IAI.  Plan: Zosyn 3.375 g EI q 8 hours.   Height: 5\' 6"  (167.6 cm) Weight: 159 lb 2.8 oz (72.2 kg) IBW/kg (Calculated) : 59.3  Temp (24hrs), Avg:97.7 F (36.5 C), Min:96.5 F (35.8 C), Max:98.7 F (37.1 C)  Recent Labs  Lab 04/29/18 0605  05/01/18 0510 05/02/18 0422 05/03/18 0635 05/04/18 0828 05/04/18 1924 05/04/18 2201 05/05/18 0500  WBC 5.0  --  5.8 6.9  --   --  30.8*  --  30.9*  CREATININE  --    < > 0.86 0.78 0.92 0.98 1.01*  --  1.17*  LATICACIDVEN  --   --   --   --   --   --  2.6* 1.9  --    < > = values in this interval not displayed.    Estimated Creatinine Clearance: 49.5 mL/min (A) (by C-G formula based on SCr of 1.17 mg/dL (H)).    No Known Allergies  Antimicrobials this admission: Cefazolin and metronidazole 7/21 perioperatively Zosyn 7/22 >>    Dose adjustments this admission:   Microbiology results: 7/14 C diff: negative 7/22 Sputum:   7/8 MRSA PCR: negative  Thank you for allowing pharmacy to be a part of this patient's care.  Ulice Dash D 05/05/2018 12:26 PM

## 2018-05-05 NOTE — Anesthesia Postprocedure Evaluation (Signed)
Anesthesia Post Note  Patient: Danielle [name]  Procedure(s) Performed: EXPLORATORY LAPAROTOMY right colectomy right and left ostomy (Right )  Patient location during evaluation: ICU Anesthesia Type: General Level of consciousness: sedated Pain management: pain level controlled Vital Signs Assessment: post-procedure vital signs reviewed and stable Respiratory status: patient remains intubated per anesthesia plan Cardiovascular status: stable Postop Assessment: no apparent nausea or vomiting Anesthetic complications: no     Last Vitals:  Vitals:   05/05/18 0600 05/05/18 0700  BP: 97/65 (!) 101/49  Pulse: 71 71  Resp: 16 16  Temp:    SpO2: 100% 100%    Last Pain:  Vitals:   05/05/18 0500  TempSrc: Oral  PainSc:                  Estill Batten

## 2018-05-05 NOTE — Progress Notes (Signed)
Chaplain was called for support for Pt daughter. Daughter was not present.    05/05/18 1600  Clinical Encounter Type  Visited With Patient  Visit Type Spiritual support  Referral From Nurse  Spiritual Encounters  Spiritual Needs Prayer

## 2018-05-05 NOTE — Progress Notes (Signed)
The patient had surgery with right hemicolectomy yesterday. Hb slightly down.  Nothing further to do from a GI point of view.  I will sign off.  Please call if any further GI concerns or questions.  We would like to thank you for the opportunity to participate in the care of Danielle [name].

## 2018-05-06 ENCOUNTER — Inpatient Hospital Stay: Payer: Medicaid Other

## 2018-05-06 DIAGNOSIS — Z9049 Acquired absence of other specified parts of digestive tract: Secondary | ICD-10-CM

## 2018-05-06 LAB — BASIC METABOLIC PANEL
ANION GAP: 3 — AB (ref 5–15)
BUN: 27 mg/dL — AB (ref 8–23)
CO2: 25 mmol/L (ref 22–32)
Calcium: 6.9 mg/dL — ABNORMAL LOW (ref 8.9–10.3)
Chloride: 105 mmol/L (ref 98–111)
Creatinine, Ser: 1.13 mg/dL — ABNORMAL HIGH (ref 0.44–1.00)
GFR calc Af Amer: 58 mL/min — ABNORMAL LOW (ref >60)
GFR, EST NON AFRICAN AMERICAN: 50 mL/min — AB (ref >60)
Glucose, Bld: 156 mg/dL — ABNORMAL HIGH (ref 70–99)
POTASSIUM: 3.6 mmol/L (ref 3.5–5.1)
SODIUM: 133 mmol/L — AB (ref 135–145)

## 2018-05-06 LAB — GLUCOSE, CAPILLARY
GLUCOSE-CAPILLARY: 153 mg/dL — AB (ref 70–99)
GLUCOSE-CAPILLARY: 93 mg/dL (ref 70–99)
Glucose-Capillary: 166 mg/dL — ABNORMAL HIGH (ref 70–99)
Glucose-Capillary: 144 mg/dL — ABNORMAL HIGH (ref 70–99)
Glucose-Capillary: 147 mg/dL — ABNORMAL HIGH (ref 70–99)
Glucose-Capillary: 129 mg/dL — ABNORMAL HIGH (ref 70–99)
Glucose-Capillary: 77 mg/dL (ref 70–99)

## 2018-05-06 LAB — HEMOGLOBIN AND HEMATOCRIT, BLOOD
HCT: 23.3 % — ABNORMAL LOW (ref 35.0–47.0)
HCT: 22.8 % — ABNORMAL LOW (ref 35.0–47.0)
HEMATOCRIT: 24 % — AB (ref 35.0–47.0)
HEMATOCRIT: 23 % — AB (ref 35.0–47.0)
HEMOGLOBIN: 7.8 g/dL — AB (ref 12.0–16.0)
HEMOGLOBIN: 7.7 g/dL — AB (ref 12.0–16.0)
Hemoglobin: 8 g/dL — ABNORMAL LOW (ref 12.0–16.0)
Hemoglobin: 7.4 g/dL — ABNORMAL LOW (ref 12.0–16.0)

## 2018-05-06 LAB — SURGICAL PATHOLOGY

## 2018-05-06 LAB — PHOSPHORUS: Phosphorus: 3.9 mg/dL (ref 2.5–4.6)

## 2018-05-06 LAB — PROCALCITONIN: PROCALCITONIN: 1.15 ng/mL

## 2018-05-06 LAB — MAGNESIUM: MAGNESIUM: 2.2 mg/dL (ref 1.7–2.4)

## 2018-05-06 MED ORDER — TRACE MINERALS CR-CU-MN-SE-ZN 10-1000-500-60 MCG/ML IV SOLN
INTRAVENOUS | Status: AC
  Administered 2018-05-06: 18:00:00 via INTRAVENOUS
  Filled 2018-05-06: qty 1560

## 2018-05-06 MED ORDER — TECHNETIUM TC 99M-LABELED RED BLOOD CELLS IV KIT
20.0000 | PACK | Freq: Once | INTRAVENOUS | Status: AC | PRN
  Administered 2018-05-06: 20.8 via INTRAVENOUS

## 2018-05-06 NOTE — Progress Notes (Signed)
Patient ID: Danielle [name], female   DOB: 09-24-1956, 62 y.o.   MRN: [id number]     [facility] Hospital Day(s): 15.   Post op day(s): 2 Days Post-Op.   Interval History: Patient seen and examined, no acute events or new complaints overnight. Patient sedated on mechanical ventilation. Unable to take history. Nurse reports she was taken out of vasopressors. Denies any other acute complication.  Vital signs in last 24 hours: [min-max] current  Temp:  [96.5 F (35.8 C)-98.7 F (37.1 C)] 97.8 F (36.6 C) (07/23 0400) Pulse Rate:  [66-89] 67 (07/23 0700) Resp:  [10-18] 14 (07/23 0700) BP: (75-149)/(51-80) 117/73 (07/23 0700) SpO2:  [95 %-100 %] 100 % (07/23 0717) FiO2 (%):  [30 %] 30 % (07/23 0717)     Height: 5\' 6"  (167.6 cm) Weight: 72.2 kg (159 lb 2.8 oz) BMI (Calculated): 25.7    Physical Exam:  Constitutional: alert, cooperative and no distress  Gastrointestinal: soft, , and non-distended. Ostomy pink and patent with no blood on ostomy bag, just clear edematous fluid. No air in bag this morning.   Labs:  CBC Latest Ref Rng & Units 05/06/2018 05/05/2018 05/05/2018  WBC 3.6 - 11.0 K/uL - - -  Hemoglobin 12.0 - 16.0 g/dL 8.0(L) 8.2(L) 8.5(L)  Hematocrit 35.0 - 47.0 % 24.0(L) 24.2(L) 25.2(L)  Platelets 150 - 440 K/uL - - -   CMP Latest Ref Rng & Units 05/06/2018 05/05/2018 05/04/2018  Glucose 70 - 99 mg/dL 156(H) 171(H) 151(H)  BUN 8 - 23 mg/dL 27(H) 23 21  Creatinine 0.44 - 1.00 mg/dL 1.13(H) 1.17(H) 1.01(H)  Sodium 135 - 145 mmol/L 133(L) 134(L) 130(L)  Potassium 3.5 - 5.1 mmol/L 3.6 3.5 3.6  Chloride 98 - 111 mmol/L 105 103 101  CO2 22 - 32 mmol/L 25 25 21(L)  Calcium 8.9 - 10.3 mg/dL 6.9(L) 6.7(L) 7.1(L)  Total Protein 6.5 - 8.1 g/dL - 3.9(L) 4.8(L)  Total Bilirubin 0.3 - 1.2 mg/dL - 1.1 1.4(H)  Alkaline Phos 38 - 126 U/L - 59 73  AST 15 - 41 U/L - 48(H) 57(H)  ALT 0 - 44 U/L - 49(H) 70(H)   Assessment/Plan:  62 y.o.femalewith bleeding hepatic flexure ischemic  colitis status post right hemicolectomy with end ileostomy and mucus fistula POD #2, complicated by pertinent comorbidities including afib on anticoagulation, HTN,severe COPDon home supplemental oxygen prn,Right lung cancer s/p radiation with known Right hilar mass, cardiac murmur, lymphedema, generalized anxiety disorder, major depression disorder, and history of Left breast cancer s/p mastectomy and chemoradiation.  Patient continue critically ill on mechanical ventilation due to respiratory failure, Out of vasopressors. Hemoglobin has kept in the 8's. These are good sing that suggest the is no active bleeding.  Agree with TPN and current critical care management.    Arnold Long, MD

## 2018-05-06 NOTE — Consult Note (Signed)
PHARMACY - ADULT TOTAL PARENTERAL NUTRITION CONSULT NOTE   Pharmacy Consult for TPN Indication: w/out adequate nutrition for > 7 days  Patient Measurements: Height: 5\' 6"  (167.6 cm) Weight: 159 lb 2.8 oz (72.2 kg) IBW/kg (Calculated) : 59.3 TPN AdjBW (KG): 72.2 Body mass index is 25.69 kg/m. Usual Weight:   Assessment:   GI:  Endo:  Insulin requirements in the past 24 hours: 10 units Lytes:  Sodium (mmol/L)  Date Value  05/06/2018 133 (L)  02/09/2015 132 (L)   Potassium (mmol/L)  Date Value  05/06/2018 3.6  02/09/2015 3.8   Magnesium (mg/dL)  Date Value  05/06/2018 2.2  12/07/2013 1.8   Phosphorus (mg/dL)  Date Value  05/06/2018 3.9   Calcium (mg/dL)  Date Value  05/06/2018 6.9 (L)   Calcium, Total (mg/dL)  Date Value  02/09/2015 9.1   Albumin (g/dL)  Date Value  05/05/2018 1.9 (L)  02/09/2015 4.3    Renal:  Pulm: Cards:  Hepatobil: Neuro: ID:  TPN Access: ordered 7/19 TPN start date: Nutritional Goals (per RD recommendation on 7/19): ZOXW:[id number] Protein: 89 Fluid:1800  Goal TPN rate is 65 ml/hr  Current Nutrition:   Plan:  Clinimix E 5/15 @ 65ml/hr, no lipids x 7 days while critically ill Add multivit and trace elements SSI sensitive q 4 hr for now Daily weights, strict in and outs Monitor closely w/ CHF- potentially fluid overloaded Electrolytes WNL Will check K, Mg, Phos as indicated, at least biweekly TG, LFT, albumin, CBC weekly Follow up tomorrow AM  Napoleon Form, [facility] Clinical Pharmacist 05/06/2018,8:07 AM

## 2018-05-06 NOTE — Progress Notes (Signed)
Dr. Mortimer Fries informed Dr. Allen Norris of bleeding scan results. Plan EGD 05/07/18 per MD. No new orders at this time.

## 2018-05-06 NOTE — Progress Notes (Signed)
[facility] at [place] NAME: Danielle [name]    MR#:  [id number]  DATE OF BIRTH:  1956-07-11   patient is on pressure support ventilation, on fentanyl, TPN., sedation.  CHIEF COMPLAINT:   Chief Complaint  Patient presents with  . Shortness of Breath    REVIEW OF SYSTEMS:  Unable to obtain review of systems because of intubation, sedation.  DRUG ALLERGIES:  No Known Allergies VITALS:  Blood pressure 110/65, pulse 74, temperature (!) 96.6 F (35.9 C), temperature source Axillary, resp. rate (!) 8, height 5\' 6"  (1.676 m), weight 72.2 kg (159 lb 2.8 oz), SpO2 100 %. PHYSICAL EXAMINATION:  Physical Exam  Constitutional: She is oriented to person, place, and time. She appears well-nourished. She is sedated and intubated.  Critically ill.  HENT:  Head: Normocephalic.  Mouth/Throat: Oropharynx is clear and moist.  Eyes: Conjunctivae are normal. No scleral icterus.  Neck: No tracheal deviation present.  Cardiovascular: Normal rate and normal heart sounds. Exam reveals no gallop.  No murmur heard. Pulmonary/Chest: Effort normal. No stridor. She is intubated. No respiratory distress. She has no wheezes. She has no rales.  Abdominal: There is no rebound.  Status post surgery, colostomy present on the right side.  Musculoskeletal: She exhibits edema. She exhibits no tenderness.  Bilateral leg edema  Neurological: She is oriented to person, place, and time. No cranial nerve deficit.  Skin: No rash noted. No erythema.  Psychiatric:  Patient is intubated, sedated.   LABORATORY PANEL:  Female CBC Recent Labs  Lab 05/05/18 0500  05/06/18 0904  WBC 30.9*  --   --   HGB 7.6*   < > 7.4*  HCT 22.0*   < > 23.3*  PLT 189  --   --    < > = values in this interval not displayed.   ------------------------------------------------------------------------------------------------------------------ Chemistries  Recent Labs  Lab 05/05/18 0500  05/06/18 0335  NA 134* 133*  K 3.5 3.6  CL 103 105  CO2 25 25  GLUCOSE 171* 156*  BUN 23 27*  CREATININE 1.17* 1.13*  CALCIUM 6.7* 6.9*  MG 2.2 2.2  AST 48*  --   ALT 49*  --   ALKPHOS 59  --   BILITOT 1.1  --    RADIOLOGY:  No results found. ASSESSMENT AND PLAN:   37F acute hypoxemic respiratory failure, acute COPD exacerbation, dehydration, hypokalemia, hypoglycemia, renal failure, hypocalcemia, hypoalbuminemia, transaminasemia/hyperbilirubinemia, BNP elevation, Troponin-I elevation, macrocytic anemia.  Hemorrhagic shock';slowly improving; secondary to ischemic colitis with active bleeding from hepatic flexure ulcer and receivedmultiple units of blood transfusion.y, fluid resuscitation ; status post emergency ex lap with right hemicolectomy, end ileostomy, mucous fistula.  Patient is critically ill with Acute respiratory failure,  Continue Zosyn, Ancef.  Patient also on Flagyl.  Patient is on TPN.  Continue TPN.  N.p.o. right now so she is on IV hydration. Patient is on pressure support ventilation, fentanyl  drip is continued to help her with pain..  Hemoglobin is around 7.4.  Patient scheduled for bleeding scan this afternoon.  New onset A. fib with RVR. Monitor closely.Acute on chronic systolic CHF LV EF: 32% - 40%.  noAnticoagulation due to GI bleed. ` Hypovolemic /septicshock: Slowly improving,   Acute renal failure due to ATN from hemorrhagic shock: Slowly improving.  S.  .  Moderate Malnutritionrelated to chronic illness. Started TPN due to ischemic colitis.  I Hyponatremia.  ,  Improved.  History of previous lung cancer, chronic  respiratory failure, history of COPD, on 2 L of oxygen all the time.hisstory of right middle lobe malignancy treated with radiation             -3.5 cm right hilar mass: needs PET scan per Dr. Rogue Bussing    Condition at this time is critical, high risk for cardiac arrest, prognosis poor,   All the records are reviewed and case  discussed with Care Management/Social Worker. Management plans discussed with the patient, her daughter and they are in agreement.   CODE STATUS: Full Code  TOTAL TIME TAKING CARE OF THIS PATIENT: 43 minutes. (critical care time)  More than 50% of the time was spent in counseling/coordination of care: YES  POSSIBLE D/C IN >3 DAYS, DEPENDING ON CLINICAL CONDITION.   Epifanio Lesches M.D on 05/06/2018 at 1:05 PM  Between 7am to 6pm - Pager - [phone]  After 6pm go to [email or web address] - [facility]

## 2018-05-06 NOTE — Progress Notes (Signed)
CRITICAL CARE NOTE  CC  follow up respiratory failure secondary to hemorrhagic shock. POD 2 from exploratory laparotomy with right hemicolectomy.   SUBJECTIVE Patient remains critically ill Prognosis is guarded  62 year old female with respiratory failure secondary to hemorrhagic shock. Patient is POD 2 from exploratory laparotomy with right hemicolectomy for hepatic flexure ulceration. Ulceration likely due to ischemic etiology. Patient continues to be intubated and sedated. Doing well with addition of precedex. Levophed titrated off and blood pressure holding in the 120/60.   SIGNIFICANT EVENTS  1 unit of PRBC given 7/22. Hemoglobin improved from 6.9--> 8.5. No further episodes of agitation  BP (!) 122/59   Pulse 69   Temp (!) 97.3 F (36.3 C) (Axillary)   Resp 16   Ht 5\' 6"  (1.676 m)   Wt 72.2 kg (159 lb 2.8 oz)   SpO2 100%   BMI 25.69 kg/m    REVIEW OF SYSTEMS  PATIENT IS UNABLE TO PROVIDE COMPLETE REVIEW OF SYSTEM S DUE TO SEVERE CRITICAL ILLNESS AND ENCEPHALOPATHY   PHYSICAL EXAMINATION:  GENERAL:critically ill appearing, +resp distress HEAD: Normocephalic, atraumatic.  EYES: Pupils equal, round, reactive to light.  No scleral icterus.  MOUTH: Moist mucosal membrane. NECK: Supple. No thyromegaly. No nodules. No JVD.  PULMONARY: +rhonchi, -wheezing CARDIOVASCULAR: S1 and S2. Regular rate and rhythm. No murmurs, rubs, or gallops.  GASTROINTESTINAL: Soft, nontender, -distended. No masses. Diminished bowel sounds. Laparotomy incision closed with staples; c/d/i. Blood noted in both ostomy bags, clot removed from RLQ ostomy. No hepatosplenomegaly.  MUSCULOSKELETAL: 1+ pedal edema. SCDs on bilateral lower legs  NEUROLOGIC: obtunded, GCS<8. Able to open her eyes upon verbal command SKIN:intact,warm,dry  ASSESSMENT AND PLAN SYNOPSIS  62 year old female POD 2 from exploratory laparotomy with right hemicolectomy with end ileostomy and mucus fistula for hepatic flexure  ulceration. Patient continues to be intubated and sedated.   Severe Hypoxic and Hypercapnic Respiratory Failure -continue Full MV support -continue Bronchodilator Therapy -Wean Fio2 and PEEP as tolerated -SAT and SBT today shows improvement from yesterday. Will not extubate today for further testing and possible procedure.  -history of right middle lobe malignancy treated with radiation  -3.5 cm right hilar mass: needs PET scan per Dr. Rogue Bussing   RENAL:   -potassium 3.6 this morning, sodium 133 -Creatinine improving 1.17-->1.13   GI BLEED:  -TPN restarted last night -hemoglobin held at 8 overnight, however 7.4 at 0900, will continue to monitor -Dr. Alva Garnet spoke with Dr. Allen Norris and Dr. Windell Moment about unknown origin of GI bleed.  Will order bleeding scan to further evaluate.    NEUROLOGY - intubated and sedated - minimal sedation to achieve a RASS goal: -1 -fentanyl and precedex   Hemorrhagic Shock -resolving, levophed off overnight and blood pressure holding -Hemoglobin 6.9-->8.5-->8.0-->7.4 -extremities warm this morning  CARDIAC ICU monitoring -new onset afib with RVR during hospital course, currently rate and rhythm controlled. -hold metoprolol, eliquis, losartan  ID -continue IV abx as prescribed: zosyn, patient afebrile -follow up cultures  -respiratory culture: pending   -gram stain with rare gram positive cocci and rods -procalcintonin: 0.36-->1.70-->1.71  DVT: SCD GI: pepcid   TRANSFUSIONS AS NEEDED MONITOR FSBS ASSESS the need for LABS as needed   Critical Care Time devoted to patient care services described in this note is 32 minutes.   Overall, patient is critically ill, prognosis is guarded.  Patient with Multiorgan failure and at high risk for cardiac arrest and death.

## 2018-05-07 ENCOUNTER — Encounter
Admission: EM | Disposition: A | Discharge status: Home / Self Care | Payer: Self-pay | Source: Home / Self Care | Attending: Internal Medicine

## 2018-05-07 ENCOUNTER — Inpatient Hospital Stay: Payer: Medicaid Other

## 2018-05-07 ENCOUNTER — Encounter: Payer: Self-pay | Admitting: Certified Registered Nurse Anesthetist

## 2018-05-07 ENCOUNTER — Encounter: Payer: Self-pay | Admitting: *Deleted

## 2018-05-07 DIAGNOSIS — K529 Noninfective gastroenteritis and colitis, unspecified: Secondary | ICD-10-CM

## 2018-05-07 DIAGNOSIS — K921 Melena: Secondary | ICD-10-CM

## 2018-05-07 HISTORY — PX: ESOPHAGOGASTRODUODENOSCOPY (EGD) WITH PROPOFOL: SHX5813

## 2018-05-07 LAB — COMPREHENSIVE METABOLIC PANEL
ALBUMIN: 2 g/dL — AB (ref 3.5–5.0)
ALT: 33 U/L (ref 0–44)
AST: 46 U/L — AB (ref 15–41)
Alkaline Phosphatase: 52 U/L (ref 38–126)
Anion gap: 3 — ABNORMAL LOW (ref 5–15)
BUN: 37 mg/dL — AB (ref 8–23)
CHLORIDE: 103 mmol/L (ref 98–111)
CO2: 28 mmol/L (ref 22–32)
CREATININE: 1.24 mg/dL — AB (ref 0.44–1.00)
Calcium: 7.7 mg/dL — ABNORMAL LOW (ref 8.9–10.3)
GFR calc Af Amer: 52 mL/min — ABNORMAL LOW (ref >60)
GFR, EST NON AFRICAN AMERICAN: 45 mL/min — AB (ref >60)
GLUCOSE: 101 mg/dL — AB (ref 70–99)
POTASSIUM: 4.1 mmol/L (ref 3.5–5.1)
SODIUM: 134 mmol/L — AB (ref 135–145)
Total Bilirubin: 1.3 mg/dL — ABNORMAL HIGH (ref 0.3–1.2)
Total Protein: 4.6 g/dL — ABNORMAL LOW (ref 6.5–8.1)

## 2018-05-07 LAB — HEMOGLOBIN AND HEMATOCRIT, BLOOD
HCT: 21 % — ABNORMAL LOW (ref 35.0–47.0)
HCT: 23.7 % — ABNORMAL LOW (ref 35.0–47.0)
HEMATOCRIT: 26.2 % — AB (ref 35.0–47.0)
Hemoglobin: 6.8 g/dL — ABNORMAL LOW (ref 12.0–16.0)
Hemoglobin: 8.8 g/dL — ABNORMAL LOW (ref 12.0–16.0)
Hemoglobin: 8.3 g/dL — ABNORMAL LOW (ref 12.0–16.0)

## 2018-05-07 LAB — CULTURE, RESPIRATORY

## 2018-05-07 LAB — CULTURE, RESPIRATORY W GRAM STAIN: Culture: NORMAL

## 2018-05-07 LAB — CBC
HCT: 22.4 % — ABNORMAL LOW (ref 35.0–47.0)
Hemoglobin: 7.5 g/dL — ABNORMAL LOW (ref 12.0–16.0)
MCH: 30.8 pg (ref 26.0–34.0)
MCHC: 33.5 g/dL (ref 32.0–36.0)
MCV: 91.9 fL (ref 80.0–100.0)
PLATELETS: 165 10*3/uL (ref 150–440)
RBC: 2.44 MIL/uL — AB (ref 3.80–5.20)
RDW: 18.8 % — ABNORMAL HIGH (ref 11.5–14.5)
WBC: 19.6 10*3/uL — AB (ref 3.6–11.0)

## 2018-05-07 LAB — GLUCOSE, CAPILLARY
GLUCOSE-CAPILLARY: 108 mg/dL — AB (ref 70–99)
GLUCOSE-CAPILLARY: 98 mg/dL (ref 70–99)
Glucose-Capillary: 101 mg/dL — ABNORMAL HIGH (ref 70–99)
Glucose-Capillary: 72 mg/dL (ref 70–99)
Glucose-Capillary: 87 mg/dL (ref 70–99)
Glucose-Capillary: 97 mg/dL (ref 70–99)

## 2018-05-07 LAB — PREPARE RBC (CROSSMATCH)

## 2018-05-07 SURGERY — ESOPHAGOGASTRODUODENOSCOPY (EGD) WITH PROPOFOL
Anesthesia: General

## 2018-05-07 MED ORDER — PROPOFOL 1000 MG/100ML IV EMUL
0.0000 ug/kg/min | INTRAVENOUS | Status: DC
  Administered 2018-05-07: 15 ug/kg/min via INTRAVENOUS
  Administered 2018-05-07: 5 ug/kg/min via INTRAVENOUS
  Filled 2018-05-07 (×2): qty 100

## 2018-05-07 MED ORDER — SODIUM CHLORIDE 0.9 % IV BOLUS
1000.0000 mL | Freq: Once | INTRAVENOUS | Status: AC
  Administered 2018-05-07: 1000 mL via INTRAVENOUS

## 2018-05-07 MED ORDER — PROPOFOL 500 MG/50ML IV EMUL
INTRAVENOUS | Status: AC
  Filled 2018-05-07: qty 50

## 2018-05-07 MED ORDER — SODIUM CHLORIDE 0.9% IV SOLUTION
Freq: Once | INTRAVENOUS | Status: AC
  Administered 2018-05-07: 11:00:00 via INTRAVENOUS

## 2018-05-07 MED ORDER — FENTANYL CITRATE (PF) 100 MCG/2ML IJ SOLN
25.0000 ug | INTRAMUSCULAR | Status: DC | PRN
  Administered 2018-05-07: 25 ug via INTRAVENOUS
  Administered 2018-05-07: 100 ug via INTRAVENOUS
  Administered 2018-05-08: 25 ug via INTRAVENOUS
  Administered 2018-05-09: 50 ug via INTRAVENOUS
  Administered 2018-05-09 (×2): 25 ug via INTRAVENOUS
  Administered 2018-05-09 – 2018-05-10 (×3): 50 ug via INTRAVENOUS
  Administered 2018-05-10: 100 ug via INTRAVENOUS
  Administered 2018-05-10: 50 ug via INTRAVENOUS
  Administered 2018-05-10: 25 ug via INTRAVENOUS
  Administered 2018-05-11: 100 ug via INTRAVENOUS
  Filled 2018-05-07 (×13): qty 2

## 2018-05-07 MED ORDER — TRACE MINERALS CR-CU-MN-SE-ZN 10-1000-500-60 MCG/ML IV SOLN
INTRAVENOUS | Status: AC
  Administered 2018-05-07: 18:00:00 via INTRAVENOUS
  Filled 2018-05-07: qty 1560

## 2018-05-07 MED ORDER — CHLORHEXIDINE GLUCONATE 0.12% ORAL RINSE (MEDLINE KIT)
15.0000 mL | Freq: Two times a day (BID) | OROMUCOSAL | Status: DC
  Administered 2018-05-07 – 2018-05-08 (×3): 15 mL via OROMUCOSAL

## 2018-05-07 NOTE — Op Note (Signed)
[facility] Gastroenterology Patient Name: Danielle [name] Procedure Date: 05/07/2018 3:16 PM MRN: [id number] Account #: [id number] Date of Birth: 1956-04-04 Admit Type: Inpatient Age: 62 Room: [facility] ENDO ROOM 2 Gender: Female Note Status: Finalized Procedure:            Upper GI endoscopy Indications:          Hematochezia Providers:            Lucilla Lame MD, MD Medicines:            Propofol per Anesthesia Complications:        No immediate complications. Procedure:            Pre-Anesthesia Assessment:                       - Prior to the procedure, a History and Physical was                        performed, and patient medications and allergies were                        reviewed. The patient's tolerance of previous                        anesthesia was also reviewed. The risks and benefits of                        the procedure and the sedation options and risks were                        discussed with the patient. All questions were                        answered, and informed consent was obtained. Prior                        Anticoagulants: The patient has taken no previous                        anticoagulant or antiplatelet agents. ASA Grade                        Assessment: II - A patient with mild systemic disease.                        After reviewing the risks and benefits, the patient was                        deemed in satisfactory condition to undergo the                        procedure.                       After obtaining informed consent, the endoscope was                        passed under direct vision. Throughout the procedure,                        the patient's blood pressure, pulse, and  oxygen                        saturations were monitored continuously. The Endoscope                        was introduced through the mouth, and advanced to the                        duodenal bulb. The upper GI endoscopy was accomplished                     without difficulty. The patient tolerated the procedure                        well. Findings:      Segmental healing moderate mucosal tear i n the hypopharynx into the       upper esophagus.      The stomach was normal.      The examined duodenum was normal. Impression:           - Mucosal changes in the esophagus consistant with a                        healing mucosal tear.                       - Normal stomach.                       - Normal examined duodenum.                       - No specimens collected. Recommendation:       - No source of upper Gi bleed seen. Procedure Code(s):    --- Professional ---                       [phone], Esophagogastroduodenoscopy, flexible, transoral;                        diagnostic, including collection of specimen(s) by                        brushing or washing, when performed (separate procedure) Diagnosis Code(s):    --- Professional ---                       K92.1, Melena (includes Hematochezia)                       K22.8, Other specified diseases of esophagus CPT copyright 2017 American Medical Association. All rights reserved. The codes documented in this report are preliminary and upon coder review may  be revised to meet current compliance requirements. Lucilla Lame MD, MD 05/07/2018 3:50:10 PM This report has been signed electronically. Number of Addenda: 0 Note Initiated On: 05/07/2018 3:16 PM      [facility]

## 2018-05-07 NOTE — Consult Note (Signed)
PHARMACY - ADULT TOTAL PARENTERAL NUTRITION CONSULT NOTE   Pharmacy Consult for TPN Indication: w/out adequate nutrition for > 7 days  Patient Measurements: Height: 5\' 6"  (167.6 cm) Weight: 170 lb 3.1 oz (77.2 kg) IBW/kg (Calculated) : 59.3 TPN AdjBW (KG): 72.2 Body mass index is 27.47 kg/m. Usual Weight:   Assessment:   GI:  Endo:  Insulin requirements in the past 24 hours: 2 units Lytes:  Sodium (mmol/L)  Date Value  05/07/2018 134 (L)  02/09/2015 132 (L)   Potassium (mmol/L)  Date Value  05/07/2018 4.1  02/09/2015 3.8   Magnesium (mg/dL)  Date Value  05/06/2018 2.2  12/07/2013 1.8   Phosphorus (mg/dL)  Date Value  05/06/2018 3.9   Calcium (mg/dL)  Date Value  05/07/2018 7.7 (L)   Calcium, Total (mg/dL)  Date Value  02/09/2015 9.1   Albumin (g/dL)  Date Value  05/07/2018 2.0 (L)  02/09/2015 4.3    Renal:  Pulm: Cards:  Hepatobil: Neuro: ID:  TPN Access: ordered 7/19 TPN start date: Nutritional Goals (per RD recommendation on 7/19): [id number]:[id number] Protein: 78 Fluid:1800  Goal TPN rate is 65 ml/hr  Current Nutrition:   Plan:  Clinimix E 5/15 @ 74ml/hr, no lipids x 7 days while critically ill through 7/29 Add multivit and trace elements SSI sensitive q 4 hr for now Daily weights, strict in and outs Monitor closely w/ CHF- potentially fluid overloaded Electrolytes WNL Will check K, Mg, Phos as indicated, at least biweekly TG, LFT, albumin, CBC weekly Follow up tomorrow AM  Napoleon Form, [facility] Clinical Pharmacist 05/07/2018,11:02 AM

## 2018-05-07 NOTE — Progress Notes (Signed)
Patient ID: Danielle [name], female   DOB: 30-Jun-1956, 62 y.o.   MRN: [id number]     [place] Hospital Day(s): 16.   Post op day(s): Day of Surgery.   Interval History: Patient seen and examined, no acute events or new complaints overnight. Patient sedated on mechanical ventilation. Nurse denies any gross blood seen. Refers patient continue stable without any acute complication.   Vital signs in last 24 hours: [min-max] current  Temp:  [98.6 F (37 C)-99.1 F (37.3 C)] 98.7 F (37.1 C) (07/24 1558) Pulse Rate:  [77-102] 89 (07/24 1650) Resp:  [4-19] 11 (07/24 1650) BP: (84-123)/(47-109) 119/63 (07/24 1650) SpO2:  [95 %-100 %] 99 % (07/24 1650) FiO2 (%):  [30 %] 30 % (07/24 1625) Weight:  [77.2 kg (170 lb 3.1 oz)] 77.2 kg (170 lb 3.1 oz) (07/24 0400)     Height: 5\' 6"  (167.6 cm) Weight: 77.2 kg (170 lb 3.1 oz) BMI (Calculated): 27.48   Intake/Output this shift:  Total I/O In: 872.6 [I.V.:495.1; Blood:340; IV Piggyback:37.5] Out: 260 [Urine:250; Stool:10]   Physical Exam:   Gastrointestinal: soft, and non-distended. Wound dry and clean. Ostomy pink and patent.   Labs:  CBC Latest Ref Rng & Units 05/07/2018 05/07/2018 05/07/2018  WBC 3.6 - 11.0 K/uL - - 19.6(H)  Hemoglobin 12.0 - 16.0 g/dL 8.8(L) 6.8(L) 7.5(L)  Hematocrit 35.0 - 47.0 % 26.2(L) 21.0(L) 22.4(L)  Platelets 150 - 440 K/uL - - 165   CMP Latest Ref Rng & Units 05/07/2018 05/06/2018 05/05/2018  Glucose 70 - 99 mg/dL 101(H) 156(H) 171(H)  BUN 8 - 23 mg/dL 37(H) 27(H) 23  Creatinine 0.44 - 1.00 mg/dL 1.24(H) 1.13(H) 1.17(H)  Sodium 135 - 145 mmol/L 134(L) 133(L) 134(L)  Potassium 3.5 - 5.1 mmol/L 4.1 3.6 3.5  Chloride 98 - 111 mmol/L 103 105 103  CO2 22 - 32 mmol/L 28 25 25   Calcium 8.9 - 10.3 mg/dL 7.7(L) 6.9(L) 6.7(L)  Total Protein 6.5 - 8.1 g/dL 4.6(L) - 3.9(L)  Total Bilirubin 0.3 - 1.2 mg/dL 1.3(H) - 1.1  Alkaline Phos 38 - 126 U/L 52 - 59  AST 15 - 41 U/L 46(H) - 48(H)  ALT 0 - 44 U/L 33 - 49(H)     Imaging studies: Bleeding scan with no finding of active bleeding   Assessment/Plan:  62 y.o.femalewithbleeding hepatic flexure ischemic colitis status post right hemicolectomy with end ileostomy and mucus fistula POD #,complicated by pertinent comorbiditiesincluding afib on anticoagulation,HTN,severe COPDon home supplemental oxygen prn,Right lung cancer s/p radiation with known Right hilar mass, cardiac murmur, lymphedema, generalized anxiety disorder, major depression disorder, and history of Left breast cancer s/p mastectomy and chemoradiation.  Patient continue critically ill on mechanical ventilation due to respiratory failure. Continue without need of pressure to maintain BP. Slowly being weaned from ventilation parameter.  Hemoglobin has decreased slowly to 6.8 and increased appropriately to 8.8 after transfussion. Dr. Allen Norris did upper endoscopy and no finding of upper gi bleeding was found. Only finding was an esophageal tear that does not seem to be the problem. These are good sing that suggest the is no active bleeding.  Agree with TPN and current critical care management.  Arnold Long, MD

## 2018-05-07 NOTE — Progress Notes (Signed)
CRITICAL CARE NOTE  CC  follow up respiratory failure secondary to hemorrhagic shock. POD 3 from exploratory laparotomy with right hemicolectomy.   SUBJECTIVE Patient remains critically ill Prognosis is guarded  62 year old female with respiratory failure secondary to hemorrhagic shock. Patient POD 3 from exploratory laparotomy with right hemicolectomy with ostomy and mucus fistula formation. Patient underwent bleeding scan yesterday which was negative. EGD tentatively scheduled for today.    SIGNIFICANT EVENTS  Patient's hemoglobin continuing to drop 7.8-->7.7-->7.5-->6.8.  BP (!) 99/56 (BP Location: Right Leg)   Pulse 95   Temp 98.9 F (37.2 C) (Axillary)   Resp (!) 5   Ht 5\' 6"  (1.676 m)   Wt 77.2 kg (170 lb 3.1 oz)   SpO2 99%   BMI 27.47 kg/m    REVIEW OF SYSTEMS  PATIENT IS UNABLE TO PROVIDE COMPLETE REVIEW OF SYSTEM S DUE TO SEVERE CRITICAL ILLNESS AND ENCEPHALOPATHY   PHYSICAL EXAMINATION:  GENERAL:critically ill appearing, +resp distress HEAD: Normocephalic, atraumatic.  EYES: Pupils equal, round, reactive to light.  No scleral icterus.  MOUTH: Moist mucosal membrane. NECK: Supple. No thyromegaly. No nodules. No JVD.  PULMONARY: +rhonchi, -wheezing, prolonged expiratory phase CARDIOVASCULAR: S1 and S2. Regular rate and rhythm. No murmurs, rubs, or gallops.  GASTROINTESTINAL: Soft, nontender, -distended. No masses. Diminished bowel sounds. Laparotomy incision c/d/i. Right ostomy with blood in the bag, left ostomy without contents.   MUSCULOSKELETAL: Edema of hands and feet.  NEUROLOGIC: obtunded, GCS<8 SKIN:intact,warm,dry  ASSESSMENT AND PLAN SYNOPSIS  62 year old female with respiratory failure secondary to hemorrhagic shock. Patient continues to be intubated and sedated. POD3 from exploratory laparotomy with right hemicolectomy. Bleeding scan negative yesterday, EGD scheduled for this afternoon. After EGD, plan for SBT with extubation.   Severe Hypoxic  and Hypercapnic Respiratory Failure -continue Full MV support -continue Bronchodilator Therapy -Wean Fio2 and PEEP as tolerated -will perform SBT after EGD with possible extubation. From pulmonary point of view, patient ready for extubation.    NEUROLOGY - intubated and sedated: d/c fentanyl start propofol  - minimal sedation to achieve a RASS goal: -1 -Patient without any further episodes of agitation  GI: -Continue TPN -EGD with Dr. Allen Norris today to find source of bleeding.  -Bleeding scan negative 7/23   Hemorrhagic shock -Hemoglobin dropped this morning to 7.5-->6.8. Will infuse 1 unit PRBC -extremities warm, blood pressure holding  Renal: -creatinine trending up: 1.13-->1.24  -likely due to continued blood loss/hemorrhagic shock -potassium and sodium improving (4.1, 134)  CARDIAC -knew onset afib during hospital course -currently controlled; hold metoprolol, eliquis, losartan  ID -continue IV abx as prescribed: zosyn, afebrile -white count improving: 30.9--> 19.6 -respiratory culture: consistent with normal respiratory flora   DVT: SCDs GI: pepcid  TRANSFUSIONS AS NEEDED MONITOR FSBS ASSESS the need for LABS as needed   Critical Care Time devoted to patient care services described in this note is 32 minutes.   Overall, patient is critically ill, prognosis is guarded.  Patient with Multiorgan failure and at high risk for cardiac arrest and death.

## 2018-05-07 NOTE — Progress Notes (Addendum)
[place] at [place] NAME: Danielle [name]    MR#:  [id number]  DATE OF BIRTH:  07-04-1956  EGD today.  Still intubated and on TPN.  CHIEF COMPLAINT:   Chief Complaint  Patient presents with  . Shortness of Breath  Patient is on fentanyl drip because of recent abdominal surgery and to control her pain also.  REVIEW OF SYSTEMS:  Unable to obtain review of systems because of intubation, sedation.  DRUG ALLERGIES:  No Known Allergies VITALS:  Blood pressure 106/75, pulse 87, temperature 98.7 F (37.1 C), temperature source Axillary, resp. rate (!) 9, height 5\' 6"  (1.676 m), weight 77.2 kg (170 lb 3.1 oz), SpO2 98 %. PHYSICAL EXAMINATION:  Physical Exam  Constitutional: She is oriented to person, place, and time. She appears well-nourished. She is sedated and intubated.  Critically ill.  HENT:  Head: Normocephalic.  Mouth/Throat: Oropharynx is clear and moist.  Eyes: Conjunctivae are normal. No scleral icterus.  Neck: No tracheal deviation present.  Cardiovascular: Normal rate and normal heart sounds. Exam reveals no gallop.  No murmur heard. Pulmonary/Chest: Effort normal. No stridor. She is intubated. No respiratory distress. She has no wheezes. She has no rales.  Abdominal: There is no rebound.  Status post surgery,  patient has ileostomy.  Musculoskeletal: She exhibits edema. She exhibits no tenderness.  Bilateral leg edema  Neurological: She is oriented to person, place, and time. No cranial nerve deficit.  Skin: No rash noted. No erythema.  Psychiatric:  Patient is intubated, sedated.   LABORATORY PANEL:  Female CBC Recent Labs  Lab 05/07/18 0427 05/07/18 0832  WBC 19.6*  --   HGB 7.5* 6.8*  HCT 22.4* 21.0*  PLT 165  --    ------------------------------------------------------------------------------------------------------------------ Chemistries  Recent Labs  Lab 05/06/18 0335 05/07/18 0427  NA 133* 134*  K  3.6 4.1  CL 105 103  CO2 25 28  GLUCOSE 156* 101*  BUN 27* 37*  CREATININE 1.13* 1.24*  CALCIUM 6.9* 7.7*  MG 2.2  --   AST  --  46*  ALT  --  33  ALKPHOS  --  52  BILITOT  --  1.3*   RADIOLOGY:  Nm Gi Blood Loss  Result Date: 05/06/2018 CLINICAL DATA:  GI bleed beginning 05/04/2018 over 6 hours. Decreasing hemoglobin. Recent exploratory laparotomy with right colectomy and right left ostomies no active bleed since 05/04/2018. Possible blood clot around stoma. EXAM: NUCLEAR MEDICINE GASTROINTESTINAL BLEEDING SCAN TECHNIQUE: Sequential abdominal images were obtained following intravenous administration of Tc-67m labeled red blood cells. RADIOPHARMACEUTICALS:  20.8 mCi Tc-46m pertechnetate in-vitro labeled red cells. COMPARISON:  CT 05/01/2018 FINDINGS: No evidence of progressive mobile radiotracer activity conforming to bowel as there is no evidence of GI bleed. IMPRESSION: Negative GI bleeding scan. Electronically Signed   By: Marin Olp M.D.   On: 05/06/2018 17:02   Dg Chest Port 1 View  Result Date: 05/07/2018 CLINICAL DATA:  Respiratory failure. EXAM: PORTABLE CHEST 1 VIEW COMPARISON:  05/05/2018. FINDINGS: Support tubes and lines appear unchanged. The heart remains enlarged. Asymmetric RIGHT greater than LEFT opacities are not significantly changed. Asymmetric RIGHT hilar prominence redemonstrated. IMPRESSION: Stable chest. Electronically Signed   By: Staci Righter M.D.   On: 05/07/2018 07:16   ASSESSMENT AND PLAN:   48F acute hypoxemic respiratory failure, acute COPD exacerbation, dehydration, hypokalemia, hypoglycemia, renal failure, hypocalcemia, hypoalbuminemia, transaminasemia/hyperbilirubinemia, BNP elevation, Troponin-I elevation, macrocytic anemia.  Hemorrhagic shock';s status post ex lap with right  hemicolectomy but patient continues to have drop in hemoglobin requiring transfusion, hemoglobin today 6.8.  Getting EGD by Dr. Lucilla Lame.,  Bleeding scan negative  yesterday. Weaning trial trial today after EGD.  New onset A. fib with RVR. Monitor closely.Acute on chronic systolic CHF LV EF: 73% - 40%.  noAnticoagulation due to GI bleed. ` Hypovolemic /septicshock: Slowly improving,   Acute renal failure due to ATN from hemorrhagic shock: Slowly improving.  S.  .  Moderate Malnutritionrelated to chronic illness. Started TPN due to ischemic colitis.  I Hyponatremia.  ,  Improved.  History of previous lung cancer, chronic respiratory failure, history of COPD, on 2 L of oxygen all the time.hisstory of right middle lobe malignancy treated with radiation             -3.5 cm right hilar mass: needs PET scan per Dr. Rogue Bussing    Condition at this time is critical, high risk for cardiac arrest, prognosis poor,   All the records are reviewed and case discussed with Care Management/Social Worker. Management plans discussed with the patient, her daughter and they are in agreement.   CODE STATUS: Full Code  TOTAL TIME TAKING CARE OF THIS PATIENT: 43 minutes. (critical care time)  More than 50% of the time was spent in counseling/coordination of care: YES  POSSIBLE D/C IN >3 DAYS, DEPENDING ON CLINICAL CONDITION.   Epifanio Lesches M.D on 05/07/2018 at 2:00 PM  Between 7am to 6pm - Pager - [phone]  After 6pm go to [email or web address] - [facility]

## 2018-05-08 ENCOUNTER — Encounter: Payer: Self-pay | Admitting: Gastroenterology

## 2018-05-08 DIAGNOSIS — K5730 Diverticulosis of large intestine without perforation or abscess without bleeding: Secondary | ICD-10-CM

## 2018-05-08 LAB — BPAM RBC
BLOOD PRODUCT EXPIRATION DATE: 201908162359
BLOOD PRODUCT EXPIRATION DATE: 201908182359
Blood Product Expiration Date: 201908162359
Blood Product Expiration Date: 201908172359
Blood Product Expiration Date: 201908172359
Blood Product Expiration Date: 201908182359
ISSUE DATE / TIME: 201907211130
ISSUE DATE / TIME: 201907221145
ISSUE DATE / TIME: 201907241042
ISSUE DATE / TIME: 201907211130
ISSUE DATE / TIME: 201907250451
UNIT TYPE AND RH: 5100
UNIT TYPE AND RH: 5100
UNIT TYPE AND RH: 5100
Unit Type and Rh: 5100
Unit Type and Rh: 5100
Unit Type and Rh: 5100

## 2018-05-08 LAB — CBC
HEMATOCRIT: 24.2 % — AB (ref 35.0–47.0)
HEMATOCRIT: 24.6 % — AB (ref 35.0–47.0)
HEMOGLOBIN: 8.3 g/dL — AB (ref 12.0–16.0)
HEMOGLOBIN: 8.1 g/dL — AB (ref 12.0–16.0)
MCH: 31.6 pg (ref 26.0–34.0)
MCH: 31.6 pg (ref 26.0–34.0)
MCHC: 34.3 g/dL (ref 32.0–36.0)
MCHC: 32.9 g/dL (ref 32.0–36.0)
MCV: 92.2 fL (ref 80.0–100.0)
MCV: 96 fL (ref 80.0–100.0)
Platelets: 152 10*3/uL (ref 150–440)
Platelets: 174 10*3/uL (ref 150–440)
RBC: 2.63 MIL/uL — ABNORMAL LOW (ref 3.80–5.20)
RBC: 2.56 MIL/uL — ABNORMAL LOW (ref 3.80–5.20)
RDW: 17.2 % — ABNORMAL HIGH (ref 11.5–14.5)
RDW: 18 % — AB (ref 11.5–14.5)
WBC: 14 10*3/uL — AB (ref 3.6–11.0)
WBC: 12 10*3/uL — ABNORMAL HIGH (ref 3.6–11.0)

## 2018-05-08 LAB — TYPE AND SCREEN
ABO/RH(D): O POS
Antibody Screen: NEGATIVE
UNIT DIVISION: 0
UNIT DIVISION: 0
UNIT DIVISION: 0
Unit division: 0
Unit division: 0
Unit division: 0

## 2018-05-08 LAB — COMPREHENSIVE METABOLIC PANEL
ALBUMIN: 1.9 g/dL — AB (ref 3.5–5.0)
ALT: 30 U/L (ref 0–44)
ANION GAP: 4 — AB (ref 5–15)
AST: 48 U/L — ABNORMAL HIGH (ref 15–41)
Alkaline Phosphatase: 49 U/L (ref 38–126)
BILIRUBIN TOTAL: 2.3 mg/dL — AB (ref 0.3–1.2)
BUN: 35 mg/dL — ABNORMAL HIGH (ref 8–23)
CO2: 27 mmol/L (ref 22–32)
Calcium: 7.8 mg/dL — ABNORMAL LOW (ref 8.9–10.3)
Chloride: 106 mmol/L (ref 98–111)
Creatinine, Ser: 1.02 mg/dL — ABNORMAL HIGH (ref 0.44–1.00)
GFR calc Af Amer: >60 mL/min (ref >60)
GFR, EST NON AFRICAN AMERICAN: 57 mL/min — AB (ref >60)
GLUCOSE: 113 mg/dL — AB (ref 70–99)
POTASSIUM: 4 mmol/L (ref 3.5–5.1)
Sodium: 137 mmol/L (ref 135–145)
Total Protein: 4.6 g/dL — ABNORMAL LOW (ref 6.5–8.1)

## 2018-05-08 LAB — MAGNESIUM: Magnesium: 2 mg/dL (ref 1.7–2.4)

## 2018-05-08 LAB — HEMOGLOBIN AND HEMATOCRIT, BLOOD
HCT: 23.6 % — ABNORMAL LOW (ref 35.0–47.0)
HEMOGLOBIN: 8.1 g/dL — AB (ref 12.0–16.0)

## 2018-05-08 LAB — PHOSPHORUS: PHOSPHORUS: 3.9 mg/dL (ref 2.5–4.6)

## 2018-05-08 LAB — GLUCOSE, CAPILLARY
Glucose-Capillary: 99 mg/dL (ref 70–99)
Glucose-Capillary: 84 mg/dL (ref 70–99)

## 2018-05-08 MED ORDER — TRACE MINERALS CR-CU-MN-SE-ZN 10-1000-500-60 MCG/ML IV SOLN
INTRAVENOUS | Status: AC
  Administered 2018-05-08: 18:00:00 via INTRAVENOUS
  Filled 2018-05-08: qty 1560

## 2018-05-08 MED ORDER — ACETAMINOPHEN 325 MG PO TABS
650.0000 mg | ORAL_TABLET | Freq: Four times a day (QID) | ORAL | Status: DC | PRN
  Administered 2018-05-16: 650 mg
  Filled 2018-05-08: qty 2

## 2018-05-08 MED ORDER — ORAL CARE MOUTH RINSE
15.0000 mL | Freq: Two times a day (BID) | OROMUCOSAL | Status: DC
  Administered 2018-05-08 – 2018-05-16 (×14): 15 mL via OROMUCOSAL

## 2018-05-08 MED ORDER — FUROSEMIDE 10 MG/ML IJ SOLN
40.0000 mg | Freq: Once | INTRAMUSCULAR | Status: AC
  Administered 2018-05-08: 40 mg via INTRAVENOUS

## 2018-05-08 MED ORDER — POTASSIUM CHLORIDE 10 MEQ/50ML IV SOLN
10.0000 meq | INTRAVENOUS | Status: AC
  Administered 2018-05-08 (×4): 10 meq via INTRAVENOUS
  Filled 2018-05-08 (×4): qty 50

## 2018-05-08 MED ORDER — METOPROLOL TARTRATE 5 MG/5ML IV SOLN
2.5000 mg | Freq: Four times a day (QID) | INTRAVENOUS | Status: DC | PRN
  Administered 2018-05-09 – 2018-05-17 (×5): 5 mg via INTRAVENOUS
  Filled 2018-05-08 (×5): qty 5

## 2018-05-08 MED ORDER — ACETAMINOPHEN 650 MG RE SUPP
650.0000 mg | Freq: Four times a day (QID) | RECTAL | Status: DC | PRN
  Filled 2018-05-08: qty 1

## 2018-05-08 NOTE — Progress Notes (Signed)
Danielle [name] at [facility] NAME: Danielle [name]    MR#:  [id number]  DATE OF BIRTH:  April 19, 1956  post extubation today.  Responding to verbal commands.  Family at bedside.  Appears comfortable.  Hemodynamically stable.  CHIEF COMPLAINT:   Chief Complaint  Patient presents with  . Shortness of Breath    REVIEW OF SYSTEMS:  Unable to obtain review of systems because of sedation effect.  Just extubated.  Still sleepy.  DRUG ALLERGIES:  No Known Allergies VITALS:  Blood pressure 121/67, pulse 99, temperature 98.9 F (37.2 C), temperature source Oral, resp. rate 13, height 5\' 6"  (1.676 m), weight 78.4 kg (172 lb 13.5 oz), SpO2 97 %. PHYSICAL EXAMINATION:  Physical Exam  Constitutional: She is oriented to person, place, and time. She appears well-nourished. She is sedated and not intubated.  Critically ill.  HENT:  Head: Normocephalic.  Mouth/Throat: Oropharynx is clear and moist.  Eyes: Conjunctivae are normal. No scleral icterus.  Neck: No tracheal deviation present.  Cardiovascular: Normal rate and normal heart sounds. Exam reveals no gallop.  No murmur heard. Pulmonary/Chest: Effort normal. No stridor. She is not intubated. No respiratory distress. She has no wheezes. She has no rales.  Abdominal: There is no rebound.  Status post surgery,  patient has ileostomy.  Musculoskeletal: She exhibits edema. She exhibits no tenderness.  Bilateral leg edema  Neurological: She is oriented to person, place, and time. No cranial nerve deficit.  Skin: No rash noted. No erythema.   LABORATORY PANEL:  Female CBC Recent Labs  Lab 05/08/18 0428  WBC 14.0*  HGB 8.3*  HCT 24.2*  PLT 152   ------------------------------------------------------------------------------------------------------------------ Chemistries  Recent Labs  Lab 05/08/18 0428  NA 137  K 4.0  CL 106  CO2 27  GLUCOSE 113*  BUN 35*  CREATININE 1.02*  CALCIUM 7.8*  MG 2.0  AST 48*   ALT 30  ALKPHOS 49  BILITOT 2.3*   RADIOLOGY:  No results found. ASSESSMENT AND PLAN:   72F acute hypoxemic respiratory failure, acute COPD exacerbation, dehydration, hypokalemia, hypoglycemia, renal failure, hypocalcemia, hypoalbuminemia, transaminasemia/hyperbilirubinemia, BNP elevation, Troponin-I elevation, macrocytic anemia.  Hemorrhagic shock';s status post ex lap with right hemicolectomy, patient of bleeding scan, EGD unremarkable.,  11 stable after multiple units of blood transfusion, stable around 8.3.  Watch closely.  Patient extubated, followed by surgery.  had ischemic colitis, right hemicolectomy, end ileostomy and mucous fistula.  Patient on Zosyn for intra-abdominal coverage, WBC down from 30-14.  Trinidad Curet is afebrile.  New onset A. fib with RVR. Monitor closely.Acute on chronic systolic CHF LV EF: 66% - 40%.  noAnticoagulation due to GI bleed. ` Hypovolemic /septicshock: Slowly improving,, patient not on pressors at this time.  Acute renal failure due to ATN from hemorrhagic shock: Slowly improving.  S.  .  Moderate Malnutritionrelated to chronic illness. Started TPN due to ischemic colitis.  I Hyponatremia.  ,  Improved.  History of previous lung cancer, chronic respiratory failure, history of COPD, on 2 L of oxygen all the time.hisstory of right middle lobe malignancy treated with radiation             -3.5 cm right hilar mass: needs PET scan per Dr. Rogue Bussing    Condition at this time is critical, high risk for cardiac arrest, prognosis poor,, Discussed with patient's daughter at bedside. All the records are reviewed and case discussed with Care Management/Social Worker. Management plans discussed with the patient, her daughter and they are  in agreement.   CODE STATUS: Full Code  TOTAL TIME TAKING CARE OF THIS PATIENT:  38 min More than 50% of the time was spent in counseling/coordination of care: YES  POSSIBLE D/C IN >3 DAYS, DEPENDING ON CLINICAL  CONDITION.   Danielle [name] M.D on 05/08/2018 at 2:22 PM  Between 7am to 6pm - Pager - [phone]  After 6pm go to [email or web address] - [facility]

## 2018-05-08 NOTE — Progress Notes (Signed)
Patient ID: Danielle [name], female   DOB: October 22, 1955, 62 y.o.   MRN: [id number]     [place] Hospital Day(s): 17.   Post op day(s): 1 Day Post-Op.   Interval History: Patient seen and examined, no acute events or new complaints overnight. Patient extubated this morning. Significant weakness.  Vital signs in last 24 hours: [min-max] current  Temp:  [98.7 F (37.1 C)-99.5 F (37.5 C)] 98.9 F (37.2 C) (07/25 1200) Pulse Rate:  [79-100] 99 (07/25 1230) Resp:  [6-19] 13 (07/25 1230) BP: (85-154)/(48-109) 121/67 (07/25 1230) SpO2:  [95 %-100 %] 97 % (07/25 1230) FiO2 (%):  [28 %-30 %] 28 % (07/25 1118) Weight:  [78.4 kg (172 lb 13.5 oz)] 78.4 kg (172 lb 13.5 oz) (07/25 0400)     Height: 5\' 6"  (167.6 cm) Weight: 78.4 kg (172 lb 13.5 oz) BMI (Calculated): 27.91   Physical Exam:   Gastrointestinal: soft, non-tender, and non-distended. Ostomy pink and patent. No blood seen on ostomy bag.   Labs:  CBC Latest Ref Rng & Units 05/08/2018 05/08/2018 05/07/2018  WBC 3.6 - 11.0 K/uL 14.0(H) - -  Hemoglobin 12.0 - 16.0 g/dL 8.3(L) 8.1(L) 8.3(L)  Hematocrit 35.0 - 47.0 % 24.2(L) 23.6(L) 23.7(L)  Platelets 150 - 440 K/uL 152 - -   CMP Latest Ref Rng & Units 05/08/2018 05/07/2018 05/06/2018  Glucose 70 - 99 mg/dL 113(H) 101(H) 156(H)  BUN 8 - 23 mg/dL 35(H) 37(H) 27(H)  Creatinine 0.44 - 1.00 mg/dL 1.02(H) 1.24(H) 1.13(H)  Sodium 135 - 145 mmol/L 137 134(L) 133(L)  Potassium 3.5 - 5.1 mmol/L 4.0 4.1 3.6  Chloride 98 - 111 mmol/L 106 103 105  CO2 22 - 32 mmol/L 27 28 25   Calcium 8.9 - 10.3 mg/dL 7.8(L) 7.7(L) 6.9(L)  Total Protein 6.5 - 8.1 g/dL 4.6(L) 4.6(L) -  Total Bilirubin 0.3 - 1.2 mg/dL 2.3(H) 1.3(H) -  Alkaline Phos 38 - 126 U/L 49 52 -  AST 15 - 41 U/L 48(H) 46(H) -  ALT 0 - 44 U/L 30 33 -    Imaging studies: No new pertinent imaging studies  Assessment/Plan:  62 y.o.femalewithbleeding hepatic flexure ischemic colitis status post right hemicolectomy with end  ileostomy and mucus fistula POD #4,complicated by pertinent comorbiditiesincluding afib on anticoagulation,HTN,severe COPDon home supplemental oxygen prn,Right lung cancer s/p radiation with known Right hilar mass, cardiac murmur, lymphedema, generalized anxiety disorder, major depression disorder, and history of Left breast cancer s/p mastectomy and chemoradiation.  Patient continue critically ill but able to be extubated this morning. Hemoglobin has been stables in the 8's since last transfussion. No sign of active bleeding at this moment.Agree with TPN and current critical care management.  Arnold Long, MD

## 2018-05-08 NOTE — Progress Notes (Signed)
CRITICAL CARE NOTE  CC  follow up respiratory failure secondary to hemorrhagic shock. POD 4 from exploratory laparotomy with right hemicolectomy for hepatic flexure ulceration   SUBJECTIVE Patient remains critically ill Prognosis is guarded  62 year old female with respiratory failure secondary to hemorrhagic shock. POD 4 from laparotomy with right hemicolectomy with ostomy and mucus fistula formation. Patient underwent EGD yesterday, which revealed a healing esophageal mucosal tear, without a source of upper GI bleed.    SIGNIFICANT EVENTS  Patient extubated this morning at 08:45.  BP 110/88   Pulse 88   Temp 99.1 F (37.3 C) (Axillary)   Resp 12   Ht 5\' 6"  (1.676 m)   Wt 78.4 kg (172 lb 13.5 oz)   SpO2 97%   BMI 27.90 kg/m    REVIEW OF SYSTEMS  PATIENT IS UNABLE TO PROVIDE COMPLETE REVIEW OF SYSTEM S DUE TO SEVERE CRITICAL ILLNESS    PHYSICAL EXAMINATION:  GENERAL:critically ill appearing, -resp distress HEAD: Normocephalic, atraumatic.  EYES: Pupils equal, round, reactive to light.  No scleral icterus.  MOUTH: Moist mucosal membrane. NECK: Supple. No thyromegaly. No nodules. No JVD.  PULMONARY: +bilateral rhonchi anteriorly, -wheezing CARDIOVASCULAR: S1 and S2. Tachycardic and regular rhythm. No murmurs, rubs, or gallops.  GASTROINTESTINAL: Soft, nontender, -distended. No masses. Diminished bowel sounds. Laparotomy incision c/d/i. Right ostomy with blood in bag, left ostomy with minimal blood in bad. No hepatosplenomegaly.  MUSCULOSKELETAL: 2+ pedal edema, Edema of Left arm/hand and right hand.  NEUROLOGIC: alert, extubated, following commands with purposeful movement.  SKIN:intact,warm,dry  ASSESSMENT AND PLAN SYNOPSIS  62 year old female with respiratory failure secondary to hemorrhagic shock. Patient extubated this morning. POD 4 from exploratory laparotomy with right hemicolectomy and ostomy with mucus fistula formation. EGD showed no source of UGI bleed, but  revealed a healing esophageal mucosal tear.   Severe Hypoxic and Hypercapnic Respiratory Failure -Extubated 7/25 -Following commands with purposeful movements -40 of lasix given before extubation for right pleural effusion  GI: -continue TPN, no flatus or BM yet -EGD with no source of UGI bleed -bleeding scan negative 7/23. -appreciate surgery and GI consulting -Continue NG tube on suction  Renal  -Creatinine improving: 1.24-->: 1.02 -Potassium 4.0: 2 runs of potassium ordered with lasix -Magnesium 2.0   NEUROLOGY -alert, following commands -will continue serial neuro exams as patient progresses off sedation.   Hemorrhagic shock -resolved -Blood pressure stable; Hemoglobin stabilized after 1 unit PRBC yesterday; 6.8--> 8.8--> 8.3-->8.1-->8.3 -extremities warm  CARDIAC -ICU monitoring -knew onset afib during hospital course -Tachycardic after extubation: consider addition of metoprolol now that shock has resolved  ID -d/c zosyn -WBC trending down: 30.9--> 19.6-->14.0 -Respiratory culture consistent with normal respiratory flora -patient afebrile   DVT: SCDs GI: Pepcid  TRANSFUSIONS AS NEEDED MONITOR FSBS ASSESS the need for LABS as needed   Critical Care Time devoted to patient care services described in this note is 34 minutes.   Overall, patient is critically ill, prognosis is guarded.  Patient with Multiorgan failure and at high risk for cardiac arrest and death.

## 2018-05-08 NOTE — Consult Note (Signed)
PHARMACY - ADULT TOTAL PARENTERAL NUTRITION CONSULT NOTE   Pharmacy Consult for TPN Indication: w/out adequate nutrition for > 7 days  Patient Measurements: Height: 5\' 6"  (167.6 cm) Weight: 172 lb 13.5 oz (78.4 kg) IBW/kg (Calculated) : 59.3 TPN AdjBW (KG): 72.2 Body mass index is 27.9 kg/m. Usual Weight:   Assessment:   GI:  Endo:  Insulin requirements in the past 24 hours: 0 units Lytes:  Sodium (mmol/L)  Date Value  05/08/2018 137  02/09/2015 132 (L)   Potassium (mmol/L)  Date Value  05/08/2018 4.0  02/09/2015 3.8   Magnesium (mg/dL)  Date Value  05/08/2018 2.0  12/07/2013 1.8   Phosphorus (mg/dL)  Date Value  05/08/2018 3.9   Calcium (mg/dL)  Date Value  05/08/2018 7.8 (L)   Calcium, Total (mg/dL)  Date Value  02/09/2015 9.1   Albumin (g/dL)  Date Value  05/08/2018 1.9 (L)  02/09/2015 4.3    Renal:  Pulm: Cards:  Hepatobil: Neuro: ID:  TPN Access: ordered 7/19 TPN start date: Nutritional Goals (per RD recommendation on 7/19): [id number] Protein: 33 Fluid:1800  Goal TPN rate is 65 ml/hr  Current Nutrition:   Plan:  Clinimix E 5/15 @ 95ml/hr, no lipids x 7 days while critically ill through 7/29 Add multivit and trace elements SSI sensitive q 4 hr for now Daily weights, strict in and outs Monitor closely w/ CHF- potentially fluid overloaded Electrolytes WNL Will check K, Mg, Phos as indicated, at least biweekly TG, LFT, albumin, CBC weekly Follow up tomorrow AM  Napoleon Form, [facility] Clinical Pharmacist 05/08/2018,1:32 PM

## 2018-05-08 NOTE — Progress Notes (Signed)
Nutrition Follow-up  DOCUMENTATION CODES:   Non-severe (moderate) malnutrition in context of chronic illness  INTERVENTION:  Continue Clinimix E 5/15 at 65 mL/hr. Provides 1108 kcal, 78 grams of protein, 1560 mL fluid daily.  Continue withholding lipids. Can consider resuming lipids on 7/28.  Continue adult MVI and trace elements as daily TPN additives.  Continue daily weights.  Will monitor for diet advancement per surgery. Patient will benefit from oral nutrition supplement once diet able to be advanced. Recommend continuing TPN until patient able to meet >/=60% estimated energy needs through PO intake.  NUTRITION DIAGNOSIS:   Moderate Malnutrition related to chronic illness(hx breast cancer, CHF, COPD) as evidenced by mild fat depletion, moderate fat depletion, mild muscle depletion, moderate muscle depletion.  Ongoing - addressing with TPN.  GOAL:   Provide needs based on ASPEN/SCCM guidelines  Met with TPN.  MONITOR:   PO intake, Supplement acceptance, Labs, Weight trends, I & O's  REASON FOR ASSESSMENT:   Malnutrition Screening Tool, Consult Assessment of nutrition requirement/status  ASSESSMENT:   62 year old female with PMHx of arthritis, HTN, COPD, anxiety, depression, CHF, breast cancer s/p left mastectomy and chemotherapy/XRT, lymphedema who is now admitted with severe hypoxic and hypercapnic respiratory failure initially requiring BiPAP, CHF exacerbation, radiation pneumonitis/fibrosis.   -On evening of 7/20 patient had 3 large bloody stools. Hgb dropped to 5.2 and patient received 2 units pRBC emergently. Developed hypovolemic/hemorrhagic shock secondary to acute blood loss anemia and was transferred to ICU. -Underwent exploratory laparotomy, right hemicolectomy with end ileostomy and mucus fistula on 7/21 in setting of hepatic flexure bleeding ulcer. Patient was initially extubated following operation but developed acute respiratory failure requiring  re-intubation 7/21. -Underwent nuclear medicine GI bleeding scan on 7/23 that found no evidence of GI bleed. -Underwent EGD 7/24 which found mucosal changes in esophagus consistent with a healing mucosal tear. -Patient extubated early this AM.  Met with patient and family member at bedside this AM. Patient was lethargic this AM. Per discussion in rounds scant output from ileostomy.  IV Access: right brachial double lumen PICC placed 7/19; ECG technology used to confirm PICC terminates at [employer]  TPN: pt tolerating Clinimix E 5/15 at 65 mL/hr  Enteral Access: NGT placed 7/21; terminates in distal stomach per abdominal x-ray 7/21; 78 cm at left nare; remains on LIS  Medications reviewed and include: Novolog 0-9 units Q4hrs (not required any today), famotidine.  Labs reviewed: CBG 84-99, BUN 35, Creatinine 1.-2, Anion gap 4.  I/O: 1075 mL UOP yesterday (0.6 mL/kg/hr), 125 mL output from NGT  Weight trend: 78.4 kg on 7/25; +18.2 kg from admission  Discussed with RN and on rounds.  Diet Order:   Diet Order           Diet - low sodium heart healthy        Diet - low sodium heart healthy        Diet - low sodium heart healthy          EDUCATION NEEDS:   Education needs have been addressed  Skin:  Skin Assessment: Reviewed RN Assessment  Last BM:  05/04/2018 - largey bloody BM; now with ileostomy  Height:   Ht Readings from Last 1 Encounters:  05/04/18 _0  (1.676 m)    Weight:   Wt Readings from Last 1 Encounters:  05/08/18 172 lb 13.5 oz (78.4 kg)    Ideal Body Weight:  59.1 kg  BMI:  Body mass index is 27.9 kg/m.  Estimated  Nutritional Needs:   Kcal:  1505-1805 (25-30 kcal/kg)  Protein:  78-90 grams (1.3-1.5 grams/kg)  Fluid:  1.5-1.8 L/day (25-30 mL/kcal)  Willey Blade, MS, RD, LDN Office: [phone] Pager: [phone] After Hours/Weekend Pager: [phone]

## 2018-05-08 NOTE — Progress Notes (Signed)
Daily Progress Note   Patient Name: Danielle [name]       Date: 05/08/2018 DOB: 1956/01/10  Age: 62 y.o. MRN#: [id number] Attending Physician: Epifanio Lesches, MD Primary Care Physician: Elisabeth Cara, NP Admit Date: 04/21/2018  Reason for Consultation/Follow-up: Establishing goals of care  Subjective: Since last palliative visit, patient had a GI bleed with ischemic bowel, had a hemicolectomy with colostomy. Was extubated and reintubated due to respiratory distress. Was extubated today. NGT in place due to high output. No family at bedside. Complains of pain with cough.      Length of Stay: 17  Current Medications: Scheduled Meds:  . budesonide (PULMICORT) nebulizer solution  0.5 mg Nebulization BID  . insulin aspart  0-9 Units Subcutaneous Q4H  . ipratropium-albuterol  3 mL Nebulization Q6H  . mouth rinse  15 mL Mouth Rinse BID    Continuous Infusions: . Marland KitchenTPN (CLINIMIX-E) Adult 65 mL/hr at 05/08/18 0800  . Marland KitchenTPN (CLINIMIX-E) Adult    . famotidine (PEPCID) IV Stopped (05/08/18 1023)    PRN Meds: acetaminophen **OR** acetaminophen, bisacodyl, fentaNYL (SUBLIMAZE) injection, ipratropium-albuterol, [DISCONTINUED] ondansetron **OR** ondansetron (ZOFRAN) IV  Physical Exam  Constitutional: No distress.  Pulmonary/Chest: Effort normal.  Neurological: She is alert.  Skin: Skin is warm and dry.            Vital Signs: BP 128/83 (BP Location: Right Leg)   Pulse 98   Temp 98.7 F (37.1 C) (Oral)   Resp 13   Ht 5\' 6"  (1.676 m)   Wt 78.4 kg (172 lb 13.5 oz)   SpO2 97%   BMI 27.90 kg/m  SpO2: SpO2: 97 % O2 Device: O2 Device: Nasal Cannula O2 Flow Rate: O2 Flow Rate (L/min): 2 L/min  Intake/output summary:   Intake/Output Summary (Last 24 hours) at 05/08/2018 1624 Last  data filed at 05/08/2018 1526 Gross per 24 hour  Intake 1404.38 ml  Output 4175 ml  Net -2770.62 ml   LBM: Last BM Date: 05/06/18 Baseline Weight: Weight: 63 kg (139 lb) Most recent weight: Weight: 78.4 kg (172 lb 13.5 oz)       Palliative Assessment/Data: NPO      Patient Active Problem List   Diagnosis Date Noted  . Blood in stool   . Focal (segmental) acute (reversible) ischemia of large intestine ([place])   .  Ulceration of intestine   . Abdominal pain, right upper quadrant   . Diverticulosis of large intestine without diverticulitis   . Abnormal CT scan, colon   . Generalized abdominal pain   . Colitis   . COPD exacerbation ([place])   . Malnutrition of moderate degree 04/23/2018  . Palliative care by specialist   . DNR (do not resuscitate) discussion   . Weakness generalized   . Acute hypoxemic respiratory failure ([place]) 04/21/2018  . Hyponatremia 02/10/2017  . Syncope 02/09/2017  . Lung nodule, solitary 07/25/2016  . Malignant neoplasm of upper lobe of right lung ([place]) 07/25/2016  . Carcinoma of overlapping sites of left breast in female, estrogen receptor positive ([place]) 06/22/2016  . Multiple lung nodules 06/22/2016  . Cough   . Lesion of right lung   . Breast cancer in female [facility]) 02/21/2016  . DOE (dyspnea on exertion) 02/14/2016  . Moderate COPD (chronic obstructive pulmonary disease) ([place]) 08/24/2014    Palliative Care Assessment & Plan    Assessment/Recommendations/Plan:  Extubated today.  Support offered to patient.      Code Status:    Code Status Orders  (From admission, onward)        Start     Ordered   04/21/18 0634  Full code  Continuous     04/21/18 0633    Code Status History    Date Active Date Inactive Code Status Order ID Comments User Context   02/09/2017 1125 02/11/2017 1706 Full Code [id number]  Henreitta Leber, MD Inpatient       Prognosis:   Unable to determine  Discharge Planning:  To Be Determined   Thank you  for allowing the Palliative Medicine Team to assist in the care of this patient.   Total Time 15 min Prolonged Time Billed  No      Greater than 50%  of this time was spent counseling and coordinating care related to the above assessment and plan.  Asencion Gowda, NP  Please contact Palliative Medicine Team phone at [phone] for questions and concerns.

## 2018-05-09 ENCOUNTER — Ambulatory Visit: Payer: Self-pay | Admitting: Family

## 2018-05-09 LAB — GLUCOSE, CAPILLARY
GLUCOSE-CAPILLARY: 97 mg/dL (ref 70–99)
GLUCOSE-CAPILLARY: 103 mg/dL — AB (ref 70–99)
Glucose-Capillary: 83 mg/dL (ref 70–99)
Glucose-Capillary: 90 mg/dL (ref 70–99)
Glucose-Capillary: 95 mg/dL (ref 70–99)
Glucose-Capillary: 91 mg/dL (ref 70–99)
Glucose-Capillary: 81 mg/dL (ref 70–99)
Glucose-Capillary: 103 mg/dL — ABNORMAL HIGH (ref 70–99)
Glucose-Capillary: 98 mg/dL (ref 70–99)
Glucose-Capillary: 113 mg/dL — ABNORMAL HIGH (ref 70–99)
Glucose-Capillary: 96 mg/dL (ref 70–99)

## 2018-05-09 LAB — CBC
HCT: 24.7 % — ABNORMAL LOW (ref 35.0–47.0)
Hemoglobin: 8.4 g/dL — ABNORMAL LOW (ref 12.0–16.0)
MCH: 31.4 pg (ref 26.0–34.0)
MCHC: 34.1 g/dL (ref 32.0–36.0)
MCV: 91.9 fL (ref 80.0–100.0)
Platelets: 183 K/uL (ref 150–440)
RBC: 2.68 MIL/uL — ABNORMAL LOW (ref 3.80–5.20)
RDW: 17.1 % — ABNORMAL HIGH (ref 11.5–14.5)
WBC: 11.3 K/uL — ABNORMAL HIGH (ref 3.6–11.0)

## 2018-05-09 LAB — BASIC METABOLIC PANEL WITH GFR
Anion gap: 4 — ABNORMAL LOW (ref 5–15)
BUN: 30 mg/dL — ABNORMAL HIGH (ref 8–23)
CO2: 32 mmol/L (ref 22–32)
Calcium: 8.6 mg/dL — ABNORMAL LOW (ref 8.9–10.3)
Chloride: 102 mmol/L (ref 98–111)
Creatinine, Ser: 0.8 mg/dL (ref 0.44–1.00)
GFR calc Af Amer: >60 mL/min
GFR calc non Af Amer: >60 mL/min
Glucose, Bld: 100 mg/dL — ABNORMAL HIGH (ref 70–99)
Potassium: 4 mmol/L (ref 3.5–5.1)
Sodium: 138 mmol/L (ref 135–145)

## 2018-05-09 MED ORDER — MENTHOL 3 MG MT LOZG
1.0000 | LOZENGE | OROMUCOSAL | Status: DC | PRN
  Administered 2018-05-09: 3 mg via ORAL
  Filled 2018-05-09: qty 9

## 2018-05-09 MED ORDER — TRACE MINERALS CR-CU-MN-SE-ZN 10-1000-500-60 MCG/ML IV SOLN
INTRAVENOUS | Status: DC
  Filled 2018-05-09 (×3): qty 1560

## 2018-05-09 MED ORDER — FAT EMULSION PLANT BASED 20 % IV EMUL
250.0000 mL | INTRAVENOUS | Status: AC
  Administered 2018-05-09: 250 mL via INTRAVENOUS
  Filled 2018-05-09: qty 250

## 2018-05-09 MED ORDER — TRACE MINERALS CR-CU-MN-SE-ZN 10-1000-500-60 MCG/ML IV SOLN
INTRAVENOUS | Status: AC
  Administered 2018-05-09: 19:00:00 via INTRAVENOUS
  Filled 2018-05-09: qty 1560

## 2018-05-09 NOTE — Progress Notes (Addendum)
CRITICAL CARE NOTE  CC  follow up respiratory failure secondary to hemorrhagic shock. POD 5 from exploratory laparotomy with right hemicolectomy for hepatic flexure ulceration.  SUBJECTIVE No significant issues over the last 24 hours. Patient is responsive on nasal cannula  62 year old female with respiratory failure secondary to hemorrhagic shock. POD 5 from exploratory laparotomy with right hemicolectomy and ileostomy with mucus fistula formation. Bleeding Scan 7/23 and EGD 7/24 showed no evidence of GI bleed. Hemoglobin is stabilizing. Extubated yesterday without complication   SIGNIFICANT EVENTS None overnight   BP 137/82   Pulse 93   Temp 98.6 F (37 C) (Oral)   Resp 14   Ht 5\' 6"  (1.676 m)   Wt 78.4 kg (172 lb 13.5 oz)   SpO2 100%   BMI 27.90 kg/m    REVIEW OF SYSTEMS  PATIENT IS UNABLE TO PROVIDE COMPLETE REVIEW OF SYSTEM S DUE TO SEVERE CRITICAL ILLNESS AND ENCEPHALOPATHY   PHYSICAL EXAMINATION:  GENERAL:critically ill appearing, -resp distress HEAD: Normocephalic, atraumatic.  EYES: Pupils equal, round, reactive to light.  No scleral icterus.  MOUTH: Moist mucosal membrane. NECK: Supple. No thyromegaly. No nodules. No JVD. Unable to turn head to the left PULMONARY: clear to auscultation anteriorly CARDIOVASCULAR: S1 and S2. Regular rate and rhythm. No murmurs, rubs, or gallops.  GASTROINTESTINAL: Soft, tender to palpation around incision and ostomy. Ostomy and mucus fistula without contents. Diminished bowel sounds. No hepatosplenomegaly.  MUSCULOSKELETAL: Left arm edema s/p mastectomy. Bilateral 2+ pedal edema. Able to move all limbs.   NEUROLOGIC: Alert and oriented to person and place. Lethargic. SKIN:intact,warm,dry. Laparotomy incision with c/d/i dressing.  ASSESSMENT AND PLAN SYNOPSIS  62 year old female with respiratory failure secondary to hemorrhagic shock. Patient extubated 7/25. POD 5 from exploratory laparotomy with right hemicolectomy and ostomy  with mucus fistula formation. Patient continues to be lethargic, but is able to follow commands.   Severe Hypoxic and Hypercapnic Respiratory Failure -extubated 7/25 -on 2L O2 nasal cannula -continue NEB treatment for COPD  GI: -continue TPN, no BM yet -EGD and bleeding scan with no source of UGI bleed -appreciate surgery and GI consult -NG tube with 230mL of output yesterday, continue on low intermittent suction  Renal: -AKI secondary to hypovolemia: Creatinine improving 1.24-->1.02-->0.80   NEUROLOGY -alert and oriented x 2 -continues to be lethargic, encephalopathy slowly resolving   Hemorrhagic shock -resolved -blood pressure stable -hemoglobin stabilizing: 8.8-->8.3-->8.1-->8.3-->8.4 -extremities warm  CARDIAC -knew onset afib during hospital course -PRN metoprolol for HR >120  ID -zosyn d/c 7/25 -WBC trending down 19.6-->14.0-->11.3   DVT: SCDs GI: Pepcid  TRANSFUSIONS AS NEEDED MONITOR FSBS ASSESS the need for LABS as needed  Patient has stable hemodynamics today. Pending telemetry transfer  Hermelinda Dellen, D.O.

## 2018-05-09 NOTE — Progress Notes (Signed)
Per MD okay for RN to order cepacol.

## 2018-05-09 NOTE — Consult Note (Signed)
PHARMACY - ADULT TOTAL PARENTERAL NUTRITION CONSULT NOTE   Pharmacy Consult for TPN Indication: w/out adequate nutrition for > 7 days  Patient Measurements: Height: 5\' 6"  (167.6 cm) Weight: 172 lb 13.5 oz (78.4 kg) IBW/kg (Calculated) : 59.3 TPN AdjBW (KG): 72.2 Body mass index is 27.9 kg/m.  Assessment:   GI:  Endo:  Insulin requirements in the past 24 hours: 0 units Lytes:  Sodium (mmol/L)  Date Value  05/09/2018 138  02/09/2015 132 (L)   Potassium (mmol/L)  Date Value  05/09/2018 4.0  02/09/2015 3.8   Magnesium (mg/dL)  Date Value  05/08/2018 2.0  12/07/2013 1.8   Phosphorus (mg/dL)  Date Value  05/08/2018 3.9   Calcium (mg/dL)  Date Value  05/09/2018 8.6 (L)   Calcium, Total (mg/dL)  Date Value  02/09/2015 9.1   Albumin (g/dL)  Date Value  05/08/2018 1.9 (L)  02/09/2015 4.3     TPN Access: ordered 7/19 TPN start date: Nutritional Goals (per RD recommendation on 7/19): [id number] Protein: 78 Fluid:1800  Goal TPN rate is 65 ml/hr  Current Nutrition:   Plan:  Clinimix E 5/15 @ 29ml/hr; initiate lipids 50mL/hr on 7/26.  Add multivit and trace elements SSI sensitive q 4 hr Daily weights, strict in and outs Monitor closely w/ CHF- potentially fluid overloaded Electrolytes WNL Will check K, Mg, Phos as indicated, at least biweekly TG, LFT, albumin, CBC weekly Follow up tomorrow AM  Simpson,Michael L, RPH 05/09/2018,11:04 AM

## 2018-05-09 NOTE — Progress Notes (Signed)
Per MD okay for RN to place NPO order.

## 2018-05-09 NOTE — Care Management (Signed)
Extubated on 7/25 and now on nasal cannula.

## 2018-05-09 NOTE — Progress Notes (Signed)
Notified pt Heart rate went up to 165. RN gave 5 mg of metoprolol iv. Will continue to assess and monitor pt.

## 2018-05-09 NOTE — Progress Notes (Signed)
Patient ID: Danielle [name], female   DOB: [birth year], 62 y.o.   MRN: [id number]     [place] [facility] Day(s): 18.   Post op day(s): 2 Days Post-Op.   Interval History: Patient seen and examined, no acute events or new complaints overnight. Patient reports refers feeling "OK", refers pain, denies nausea or vomiting. Patient today alert and cooperative. Tolerating extubation.   Vital signs in last 24 hours: [min-max] current  Temp:  [97.6 F (36.4 C)-99.5 F (37.5 C)] 97.6 F (36.4 C) (07/26 0800) Pulse Rate:  [90-131] 93 (07/26 0900) Resp:  [12-23] 14 (07/26 0900) BP: (88-159)/(58-101) 143/71 (07/26 0900) SpO2:  [92 %-100 %] 98 % (07/26 0900) FiO2 (%):  [28 %] 28 % (07/25 1118)     Height: 5\' 6"  (167.6 cm) Weight: 78.4 kg (172 lb 13.5 oz) BMI (Calculated): 27.91   Physical Exam:  Constitutional: alert, cooperative and no distress  Gastrointestinal: soft, non-tender, and non-distended. Ostomy pink and patent. No air, no blood or stool in the bag.   Labs:  CBC Latest Ref Rng & Units 05/09/2018 05/08/2018 05/08/2018  WBC 3.6 - 11.0 K/uL 11.3(H) 12.0(H) 14.0(H)  Hemoglobin 12.0 - 16.0 g/dL 8.4(L) 8.1(L) 8.3(L)  Hematocrit 35.0 - 47.0 % 24.7(L) 24.6(L) 24.2(L)  Platelets 150 - 440 K/uL 183 174 152   CMP Latest Ref Rng & Units 05/09/2018 05/08/2018 05/07/2018  Glucose 70 - 99 mg/dL 100(H) 113(H) 101(H)  BUN 8 - 23 mg/dL 30(H) 35(H) 37(H)  Creatinine 0.44 - 1.00 mg/dL 0.80 1.02(H) 1.24(H)  Sodium 135 - 145 mmol/L 138 137 134(L)  Potassium 3.5 - 5.1 mmol/L 4.0 4.0 4.1  Chloride 98 - 111 mmol/L 102 106 103  CO2 22 - 32 mmol/L 32 27 28  Calcium 8.9 - 10.3 mg/dL 8.6(L) 7.8(L) 7.7(L)  Total Protein 6.5 - 8.1 g/dL - 4.6(L) 4.6(L)  Total Bilirubin 0.3 - 1.2 mg/dL - 2.3(H) 1.3(H)  Alkaline Phos 38 - 126 U/L - 49 52  AST 15 - 41 U/L - 48(H) 46(H)  ALT 0 - 44 U/L - 30 33   Imaging studies: No new pertinent imaging studies   Assessment/Plan:  62 y.o.femalewithbleeding  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  Patient recovering slowly. Tolerating well extubation. No distress. Hemoglobin continue to be stables in the 8's since last transfussion. Today 8.4 this morning.No sign of active bleeding at this moment.Agree with TPN. Since ostomy has no air, will recommend to keep patient NPO.     Arnold Long, MD

## 2018-05-09 NOTE — Progress Notes (Signed)
Patient had an upper endoscopy the other day with a superficial esophageal tear.  The patient has since been extubated and moved to the floor.  Patient's hemoglobin has been stable.  The upper endoscopy did not show any sign of any GI bleeding.  Nothing further to add from a GI point of view.  Consider other sources of bleeding like possibly intraperitoneal bleeding. I will sign off.  Please call if any further GI concerns or questions.  We would like to thank you for the opportunity to participate in the care of Danielle [name].

## 2018-05-09 NOTE — Progress Notes (Signed)
[facility] [place] NAME: Danielle [name]    MR#:  [id number]  DATE OF BIRTH:  May 03, 1956  post extubation 05/08/18.  Responding to verbal commands.  Family at bedside.  Appears comfortable. Pain tolerable Hemodynamically stable.  CHIEF COMPLAINT:   Chief Complaint  Patient presents with  . Shortness of Breath    REVIEW OF SYSTEMS:  Review of Systems  Constitutional: Negative for chills and fever.  HENT: Negative for hearing loss.   Eyes: Negative for blurred vision, double vision and photophobia.  Respiratory: Denies any cough or shortness of breath negative for hemoptysis.   Cardiovascular: Negative for palpitations, orthopnea and leg swelling.  Gastrointestinal: Negative for abdominal pain, diarrhea and vomiting.  Genitourinary: Negative for dysuria and urgency.  Musculoskeletal: Negative for myalgias and neck pain.  Skin: Negative for rash.  Neurological: Negative for dizziness, focal weakness, seizures, weakness and headaches.  Psychiatric/Behavioral: Negative for memory loss. The patient does not have insomnia.       DRUG ALLERGIES:  No Known Allergies VITALS:  Blood pressure (!) 153/94, pulse (!) 165, temperature 98.4 F (36.9 C), temperature source Oral, resp. rate 16, height 5\' 6"  (1.676 m), weight 78.4 kg (172 lb 13.5 oz), SpO2 100 %. PHYSICAL EXAMINATION:  Physical Exam  Constitutional: She is oriented to person, place, and time. She appears well-nourished. She is sedated and not intubated.  Critically ill.  HENT:  Head: Normocephalic.  Mouth/Throat: Oropharynx is clear and moist.  Eyes: Conjunctivae are normal. No scleral icterus.  Neck: No tracheal deviation present.  Cardiovascular: Normal rate and normal heart sounds. Exam reveals no gallop.  No murmur heard. Pulmonary/Chest: Effort normal. No stridor. She is not intubated. No respiratory distress. She has no wheezes. She has no rales.  Abdominal: There is no rebound.    Status post surgery,  patient has ileostomy.  Musculoskeletal: She exhibits edema. She exhibits no tenderness.  Bilateral leg edema  Neurological: She is oriented to person, place, and time. No cranial nerve deficit.  Skin: No rash noted. No erythema.   LABORATORY PANEL:  Female CBC Recent Labs  Lab 05/09/18 0345  WBC 11.3*  HGB 8.4*  HCT 24.7*  PLT 183   ------------------------------------------------------------------------------------------------------------------ Chemistries  Recent Labs  Lab 05/08/18 0428 05/09/18 0345  NA 137 138  K 4.0 4.0  CL 106 102  CO2 27 32  GLUCOSE 113* 100*  BUN 35* 30*  CREATININE 1.02* 0.80  CALCIUM 7.8* 8.6*  MG 2.0  --   AST 48*  --   ALT 30  --   ALKPHOS 49  --   BILITOT 2.3*  --    RADIOLOGY:  No results found. ASSESSMENT AND PLAN:   58F acute hypoxemic respiratory failure, acute COPD exacerbation, dehydration, hypokalemia, hypoglycemia, renal failure, hypocalcemia, hypoalbuminemia, transaminasemia/hyperbilirubinemia, BNP elevation, Troponin-I elevation, macrocytic anemia.  Hemorrhagic shock';s status post ex lap with right hemicolectomy,  bleeding scan, EGD unremarkable.,  Hb  stable after multiple units of blood transfusion, stable around 8.4.  Watch closely.  Patient extubated,  Patient on Zosyn for intra-abdominal coverage, WBC down from 30-14.  she is afebrile.  Ischemic colitis status post right-sided hemicolectomy and ileostomy.  Followed by surgery.  TPN, n.p.o.  New onset A. fib with RVR. Monitor closely.Acute on chronic systolic CHF LV EF: 26% - 40%.  noAnticoagulation due to GI bleed. ` .Acute renal failure due to ATN from hemorrhagic shock: resolved.  Moderate Malnutritionrelated to chronic illness. Started TPN due to ischemic  colitis.  I Hyponatremia.  ,  Improved.  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 Rogue Bussing     high risk for cardiac arrest, prognosis poor,, Discussed with patient's family  at bedside. All the records are reviewed and case discussed with Care Management/Social Worker. Management plans discussed with the patient, her daughter and they are in agreement.   CODE STATUS: Full Code  TOTAL TIME TAKING CARE OF THIS PATIENT:  38 min More than 50% of the time was spent in counseling/coordination of care: YES  POSSIBLE D/C IN ?  DAYS, DEPENDING ON CLINICAL CONDITION.   Nicholes Mango M.D on 05/09/2018 at 4:34 PM  Between 7am to 6pm - Pager - [phone]  After 6pm go to [email or web address] - [facility]

## 2018-05-09 NOTE — Progress Notes (Signed)
Notified MD pt remove her NG tube. Per MD okay not to put it back at this time, but it will need replace if pt starts to vomit. Will continue to assess and monitor pt.

## 2018-05-09 NOTE — Care Management (Signed)
Transfer to floor care.  Patient extubated yesterday. NG in placed.  New ostomy this admission.  Per documentation patient has chronic O2 that she pays for out of pocket. [facility] services and equipment arranged, and patient has been provided Medication Management and [facility]  Applications x2 this admission. Documented that patient was provided Eliquis coupon.  Anticoagulation may need to be re considered if needed for long term.  Voucher will only be for 1 month supply.  RNCM following

## 2018-05-09 NOTE — Progress Notes (Deleted)
   Patient ID: Danielle [name], female    DOB: 1956/05/03, 62 y.o.   MRN: [id number]  HPI  Danielle [name] is a 62 y/o female with a history of  Echo report from 04/21/18 reviewed and showed an EF of 35-40% along with very mild stenosis and moderate MR.   Admitted 04/21/18 due to   Review of Systems    Physical Exam

## 2018-05-10 ENCOUNTER — Inpatient Hospital Stay: Payer: Medicaid Other

## 2018-05-10 LAB — CBC
HCT: 33 % — ABNORMAL LOW (ref 35.0–47.0)
Hemoglobin: 11.1 g/dL — ABNORMAL LOW (ref 12.0–16.0)
MCH: 31.1 pg (ref 26.0–34.0)
MCHC: 33.5 g/dL (ref 32.0–36.0)
MCV: 92.8 fL (ref 80.0–100.0)
PLATELETS: 282 10*3/uL (ref 150–440)
RBC: 3.56 MIL/uL — ABNORMAL LOW (ref 3.80–5.20)
RDW: 17.3 % — ABNORMAL HIGH (ref 11.5–14.5)
WBC: 15.8 10*3/uL — ABNORMAL HIGH (ref 3.6–11.0)

## 2018-05-10 LAB — BASIC METABOLIC PANEL
Anion gap: 8 (ref 5–15)
BUN: 26 mg/dL — ABNORMAL HIGH (ref 8–23)
CO2: 27 mmol/L (ref 22–32)
CREATININE: 0.6 mg/dL (ref 0.44–1.00)
Calcium: 8.7 mg/dL — ABNORMAL LOW (ref 8.9–10.3)
Chloride: 101 mmol/L (ref 98–111)
Glucose, Bld: 167 mg/dL — ABNORMAL HIGH (ref 70–99)
POTASSIUM: 3.8 mmol/L (ref 3.5–5.1)
SODIUM: 136 mmol/L (ref 135–145)

## 2018-05-10 LAB — GLUCOSE, CAPILLARY
GLUCOSE-CAPILLARY: 146 mg/dL — AB (ref 70–99)
GLUCOSE-CAPILLARY: 110 mg/dL — AB (ref 70–99)
Glucose-Capillary: 103 mg/dL — ABNORMAL HIGH (ref 70–99)
Glucose-Capillary: 151 mg/dL — ABNORMAL HIGH (ref 70–99)
Glucose-Capillary: 156 mg/dL — ABNORMAL HIGH (ref 70–99)
Glucose-Capillary: 96 mg/dL (ref 70–99)

## 2018-05-10 LAB — MAGNESIUM: MAGNESIUM: 1.4 mg/dL — AB (ref 1.7–2.4)

## 2018-05-10 MED ORDER — MAGNESIUM SULFATE 2 GM/50ML IV SOLN: 2.0000 g | Freq: Once | INTRAVENOUS | Status: DC

## 2018-05-10 MED ORDER — MAGNESIUM SULFATE 4 GM/100ML IV SOLN
4.0000 g | Freq: Once | INTRAVENOUS | Status: AC
  Administered 2018-05-10: 4 g via INTRAVENOUS
  Filled 2018-05-10: qty 100

## 2018-05-10 MED ORDER — IPRATROPIUM-ALBUTEROL 0.5-2.5 (3) MG/3ML IN SOLN
3.0000 mL | Freq: Three times a day (TID) | RESPIRATORY_TRACT | Status: DC
  Administered 2018-05-11 – 2018-05-17 (×18): 3 mL via RESPIRATORY_TRACT
  Filled 2018-05-10 (×20): qty 3

## 2018-05-10 MED ORDER — TRACE MINERALS CR-CU-MN-SE-ZN 10-1000-500-60 MCG/ML IV SOLN
INTRAVENOUS | Status: AC
  Administered 2018-05-10: 18:00:00 via INTRAVENOUS
  Filled 2018-05-10: qty 1560

## 2018-05-10 MED ORDER — FAT EMULSION PLANT BASED 20 % IV EMUL
250.0000 mL | INTRAVENOUS | Status: AC
  Administered 2018-05-10: 250 mL via INTRAVENOUS
  Filled 2018-05-10: qty 250

## 2018-05-10 MED ORDER — MAGNESIUM SULFATE IN D5W 1-5 GM/100ML-% IV SOLN: 1.0000 g | Freq: Once | INTRAVENOUS | Status: DC

## 2018-05-10 MED ORDER — DILTIAZEM HCL 25 MG/5ML IV SOLN
10.0000 mg | Freq: Once | INTRAVENOUS | Status: AC
  Administered 2018-05-10: 10 mg via INTRAVENOUS
  Filled 2018-05-10: qty 5

## 2018-05-10 NOTE — Progress Notes (Signed)
[facility] [place] NAME: Danielle [name]    MR#:  [id number]  DATE OF BIRTH:  [birth year]  post extubation 05/08/18.  Responding to verbal commands.  Family at bedside.  Appears comfortable. Pain tolerable Hemodynamically stable.  CHIEF COMPLAINT:   Chief Complaint  Patient presents with  . Shortness of Breath   Patient is somewhat disoriented today but answers few questions, no complaints.  No overnight events REVIEW OF SYSTEMS:  Review of Systems -limited as patient is somewhat delirious Constitutional: Negative for chills and fever.  Respiratory: Denies any cough or shortness of breath negative for hemoptysis.   Cardiovascular: Negative for palpitations, orthopnea and leg swelling.  Gastrointestinal: Negative for abdominal pain, diarrhea and vomiting.   Skin: Negative for rash.  Neurological: Negative for dizziness, weakness and headaches.         DRUG ALLERGIES:  No Known Allergies VITALS:  Blood pressure (!) 161/125, pulse (!) 140, temperature 98.3 F (36.8 C), temperature source Oral, resp. rate (!) 24, height 5\' 6"  (1.676 m), weight 78.4 kg (172 lb 13.5 oz), SpO2 96 %. PHYSICAL EXAMINATION:  Physical Exam  Constitutional: She is oriented to person, place, and time. She appears well-nourished. She is sedated and not intubated.  Critically ill.  HENT:  Head: Normocephalic.  Mouth/Throat: Oropharynx is clear and moist.  Eyes: Conjunctivae are normal. No scleral icterus.  Neck: No tracheal deviation present.  Cardiovascular: Normal rate and normal heart sounds. Exam reveals no gallop.  No murmur heard. Pulmonary/Chest: Effort normal. No stridor. She is not intubated. No respiratory distress. She has no wheezes. She has no rales.  Abdominal: There is no rebound.  Status post surgery,  patient has ileostomy.  Musculoskeletal: She exhibits edema. She exhibits no tenderness.  Bilateral leg edema  Neurological: She is oriented to  person, place, and time. No cranial nerve deficit.  Skin: No rash noted. No erythema.   LABORATORY PANEL:  Female CBC Recent Labs  Lab 05/10/18 0653  WBC 15.8*  HGB 11.1*  HCT 33.0*  PLT 282   ------------------------------------------------------------------------------------------------------------------ Chemistries  Recent Labs  Lab 05/08/18 0428  05/10/18 0653  NA 137   < > 136  K 4.0   < > 3.8  CL 106   < > 101  CO2 27   < > 27  GLUCOSE 113*   < > 167*  BUN 35*   < > 26*  CREATININE 1.02*   < > 0.60  CALCIUM 7.8*   < > 8.7*  MG 2.0  --  1.4*  AST 48*  --   --   ALT 30  --   --   ALKPHOS 49  --   --   BILITOT 2.3*  --   --    < > = values in this interval not displayed.   RADIOLOGY:  Dg Abd Portable 1v  Result Date: 05/10/2018 CLINICAL DATA:  Postoperative ileus EXAM: PORTABLE ABDOMEN - 1 VIEW COMPARISON:  05/04/2018 FINDINGS: Multiple dilated loops of small bowel are identified in the mid abdomen. Ostomies are identified bilaterally stable from the prior exam. Postsurgical changes are again seen. Paucity of colonic gas is noted. IMPRESSION: Small-bowel dilatation consistent with postoperative ileus. Correlation with the physical exam is recommended. No free air is noted. Electronically Signed   By: Inez Catalina M.D.   On: 05/10/2018 12:31   ASSESSMENT AND PLAN:   60F acute hypoxemic respiratory failure, acute COPD exacerbation, dehydration, hypokalemia, hypoglycemia, renal failure, hypocalcemia,  hypoalbuminemia, transaminasemia/hyperbilirubinemia, BNP elevation, Troponin-I elevation, macrocytic anemia.   Moderate Malnutritionrelated to chronic illness. Started TPN due to ischemic colitis.  Hypomagnesemia replete and recheck  Ischemic colitis status post right-sided hemicolectomy and ileostomy.  Followed by surgery.  TPN, n.p.o.  Hemorrhagic shock';s status post ex lap with right hemicolectomy,  bleeding scan, EGD unremarkable.,  Hb  stable after multiple units  of blood transfusion, stable around 8.4.  Watch closely.  Patient extubated,  Patient on Zosyn for intra-abdominal coverage, WBC down from 30-14.  she is afebrile.   New onset A. fib with RVR. Monitor closely  .Acute on chronic systolic CHF LV EF: 13% - 40%.  noAnticoagulation due to GI bleed. ` .Acute renal failure due to ATN from hemorrhagic shock: resolved.  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 Rogue Bussing     high risk for cardiac arrest, prognosis poor,, Discussed with patient's family  at bedside. All the records are reviewed and case discussed with Care Management/Social Worker. Management plans discussed with the patient, her daughter and they are in agreement.   CODE STATUS: Full Code  TOTAL TIME TAKING CARE OF THIS PATIENT:  38 min More than 50% of the time was spent in counseling/coordination of care: YES  POSSIBLE D/C IN ?  DAYS, DEPENDING ON CLINICAL CONDITION.   Nicholes Mango M.D on 05/10/2018 at 9:07 PM  Between 7am to 6pm - Pager - [phone]  After 6pm go to [email or web address] - [facility]

## 2018-05-10 NOTE — Consult Note (Signed)
PHARMACY - ADULT TOTAL PARENTERAL NUTRITION CONSULT NOTE   Pharmacy Consult for TPN Indication: w/out adequate nutrition for > 7 days  Patient Measurements: Height: 5\' 6"  (167.6 cm) Weight: 172 lb 13.5 oz (78.4 kg) IBW/kg (Calculated) : 59.3 TPN AdjBW (KG): 72.2 Body mass index is 27.9 kg/m.  Assessment:   GI:  Endo:  Insulin requirements in the past 24 hours: 0 units Lytes:  Sodium (mmol/L)  Date Value  05/10/2018 136  02/09/2015 132 (L)   Potassium (mmol/L)  Date Value  05/10/2018 3.8  02/09/2015 3.8   Magnesium (mg/dL)  Date Value  05/10/2018 1.4 (L)  12/07/2013 1.8   Phosphorus (mg/dL)  Date Value  05/08/2018 3.9   Calcium (mg/dL)  Date Value  05/10/2018 8.7 (L)   Calcium, Total (mg/dL)  Date Value  02/09/2015 9.1   Albumin (g/dL)  Date Value  05/08/2018 1.9 (L)  02/09/2015 4.3     TPN Access: ordered 7/19 TPN start date: Nutritional Goals (per RD recommendation on 7/19): [id number] Protein: 78 Fluid:1800  Goal TPN rate is 65 ml/hr  Current Nutrition:   Plan:  Continue Clinimix E 5/15 @ 83ml/hr; initiate lipids 70mL/hr on 7/26.  Add multivit and trace elements SSI sensitive q 4 hr Daily weights, strict in and outs Monitor closely w/ CHF- potentially fluid overloaded Electrolytes WNL Will check K, Mg, Phos as indicated, at least biweekly TG, LFT, albumin, CBC weekly Follow up tomorrow AM  7/27 Mg: 1.4. Will replace with Magnesium 4g IV x 1 dose  Pernell Dupre, PharmD, BCPS Clinical Pharmacist 05/10/2018 9:33 PM

## 2018-05-10 NOTE — Consult Note (Signed)
PHARMACY - ADULT TOTAL PARENTERAL NUTRITION CONSULT NOTE   Pharmacy Consult for TPN Indication: w/out adequate nutrition for > 7 days  Patient Measurements: Height: 5\' 6"  (167.6 cm) Weight: 172 lb 13.5 oz (78.4 kg) IBW/kg (Calculated) : 59.3 TPN AdjBW (KG): 72.2 Body mass index is 27.9 kg/m.  Assessment:   GI:  Endo:  Insulin requirements in the past 24 hours: 0 units Lytes:  Sodium (mmol/L)  Date Value  05/10/2018 136  02/09/2015 132 (L)   Potassium (mmol/L)  Date Value  05/10/2018 3.8  02/09/2015 3.8   Magnesium (mg/dL)  Date Value  05/08/2018 2.0  12/07/2013 1.8   Phosphorus (mg/dL)  Date Value  05/08/2018 3.9   Calcium (mg/dL)  Date Value  05/10/2018 8.7 (L)   Calcium, Total (mg/dL)  Date Value  02/09/2015 9.1   Albumin (g/dL)  Date Value  05/08/2018 1.9 (L)  02/09/2015 4.3     TPN Access: ordered 7/19 TPN start date: Nutritional Goals (per RD recommendation on 7/19): [id number] Protein: 78 Fluid:1800  Goal TPN rate is 65 ml/hr  Current Nutrition:   Plan:  Continue Clinimix E 5/15 @ 21ml/hr; initiate lipids 75mL/hr on 7/26.  Add multivit and trace elements SSI sensitive q 4 hr Daily weights, strict in and outs Monitor closely w/ CHF- potentially fluid overloaded Electrolytes WNL Will check K, Mg, Phos as indicated, at least biweekly TG, LFT, albumin, CBC weekly Follow up tomorrow AM  Danielle [name] D, RPH 05/10/2018,11:07 AM

## 2018-05-10 NOTE — Progress Notes (Signed)
Per MD okay for RN to order PT consult.

## 2018-05-10 NOTE — Progress Notes (Signed)
Patient ID: LITHA LAMARTINA, female   DOB: 20-Jan-1956, 62 y.o.   MRN: [id number]     [facility] Day(s): 19.   Post op day(s): 3 Days Post-Op.   Interval History: Patient seen and examined, no acute events or new complaints overnight. Patient with baseline disorientation. Nurse reports no issues.   Vital signs in last 24 hours: [min-max] current  Temp:  [97.6 F (36.4 C)-98.6 F (37 C)] 97.6 F (36.4 C) (07/27 0348) Pulse Rate:  [92-165] 118 (07/27 0829) Resp:  [16-24] 24 (07/27 0348) BP: (146-175)/(93-112) 175/105 (07/27 0829) SpO2:  [87 %-100 %] 98 % (07/27 0759)     Height: 5\' 6"  (167.6 cm) Weight: 78.4 kg (172 lb 13.5 oz) BMI (Calculated): 27.91   Physical Exam:  Constitutional: alert, cooperative and no distress  Gastrointestinal: soft, non-tender, and non-distended. Ostomy pink and patent. No air on ileostomy bag.   Labs:  CBC Latest Ref Rng & Units 05/10/2018 05/09/2018 05/08/2018  WBC 3.6 - 11.0 K/uL 15.8(H) 11.3(H) 12.0(H)  Hemoglobin 12.0 - 16.0 g/dL 11.1(L) 8.4(L) 8.1(L)  Hematocrit 35.0 - 47.0 % 33.0(L) 24.7(L) 24.6(L)  Platelets 150 - 440 K/uL 282 183 174   CMP Latest Ref Rng & Units 05/10/2018 05/09/2018 05/08/2018  Glucose 70 - 99 mg/dL 167(H) 100(H) 113(H)  BUN 8 - 23 mg/dL 26(H) 30(H) 35(H)  Creatinine 0.44 - 1.00 mg/dL 0.60 0.80 1.02(H)  Sodium 135 - 145 mmol/L 136 138 137  Potassium 3.5 - 5.1 mmol/L 3.8 4.0 4.0  Chloride 98 - 111 mmol/L 101 102 106  CO2 22 - 32 mmol/L 27 32 27  Calcium 8.9 - 10.3 mg/dL 8.7(L) 8.6(L) 7.8(L)  Total Protein 6.5 - 8.1 g/dL - - 4.6(L)  Total Bilirubin 0.3 - 1.2 mg/dL - - 2.3(H)  Alkaline Phos 38 - 126 U/L - - 49  AST 15 - 41 U/L - - 48(H)  ALT 0 - 44 U/L - - 30   Imaging studies: No new pertinent imaging studies   Assessment/Plan:  62 y.o.femalewithbleeding hepatic flexure ischemic colitis status post right hemicolectomy with end ileostomy and mucus fistula POD #5,complicated by pertinent  comorbiditiesincluding afib on anticoagulation,HTN,severe COPDon home supplemental oxygen prn,Right lung cancer s/p radiation with known Right hilar mass, cardiac murmur, lymphedema, generalized anxiety disorder, major depression disorder, and history of Left breast cancer s/p mastectomy and chemoradiation.  Patient recovering slowly. No distress. Hemoglobin today is 11 which does not seem true, but still there is no sign of bleeding. Still not having air per ostomy. Will keep NPO. Patient removed NGT yesterday and is distended. Most likely post op ileus. Continue wihth TPN.   Arnold Long, MD

## 2018-05-11 ENCOUNTER — Inpatient Hospital Stay: Payer: Medicaid Other

## 2018-05-11 ENCOUNTER — Encounter: Payer: Self-pay | Admitting: Radiology

## 2018-05-11 LAB — GLUCOSE, CAPILLARY
GLUCOSE-CAPILLARY: 109 mg/dL — AB (ref 70–99)
GLUCOSE-CAPILLARY: 110 mg/dL — AB (ref 70–99)
GLUCOSE-CAPILLARY: 113 mg/dL — AB (ref 70–99)
GLUCOSE-CAPILLARY: 122 mg/dL — AB (ref 70–99)
GLUCOSE-CAPILLARY: 126 mg/dL — AB (ref 70–99)
Glucose-Capillary: 133 mg/dL — ABNORMAL HIGH (ref 70–99)

## 2018-05-11 LAB — CBC
HEMATOCRIT: 29.7 % — AB (ref 35.0–47.0)
HEMOGLOBIN: 10 g/dL — AB (ref 12.0–16.0)
MCH: 31.2 pg (ref 26.0–34.0)
MCHC: 33.7 g/dL (ref 32.0–36.0)
MCV: 92.4 fL (ref 80.0–100.0)
Platelets: 274 10*3/uL (ref 150–440)
RBC: 3.21 MIL/uL — ABNORMAL LOW (ref 3.80–5.20)
RDW: 16.9 % — ABNORMAL HIGH (ref 11.5–14.5)
WBC: 12.8 10*3/uL — AB (ref 3.6–11.0)

## 2018-05-11 LAB — POTASSIUM: Potassium: 4.1 mmol/L (ref 3.5–5.1)

## 2018-05-11 LAB — MAGNESIUM: MAGNESIUM: 2.4 mg/dL (ref 1.7–2.4)

## 2018-05-11 MED ORDER — HALOPERIDOL LACTATE 2 MG/ML PO CONC
2.0000 mg | Freq: Once | ORAL | Status: DC
  Filled 2018-05-11: qty 1

## 2018-05-11 MED ORDER — HALOPERIDOL LACTATE 5 MG/ML IJ SOLN
INTRAMUSCULAR | Status: AC
  Filled 2018-05-11: qty 1

## 2018-05-11 MED ORDER — MORPHINE SULFATE (PF) 2 MG/ML IV SOLN
2.0000 mg | INTRAVENOUS | Status: DC | PRN
  Administered 2018-05-11 – 2018-05-17 (×12): 2 mg via INTRAVENOUS
  Filled 2018-05-11 (×13): qty 1

## 2018-05-11 MED ORDER — IOPAMIDOL (ISOVUE-300) INJECTION 61%
100.0000 mL | Freq: Once | INTRAVENOUS | Status: AC | PRN
  Administered 2018-05-11: 100 mL via INTRAVENOUS

## 2018-05-11 MED ORDER — TRACE MINERALS CR-CU-MN-SE-ZN 10-1000-500-60 MCG/ML IV SOLN
INTRAVENOUS | Status: AC
  Administered 2018-05-11: 18:00:00 via INTRAVENOUS
  Filled 2018-05-11: qty 1560

## 2018-05-11 MED ORDER — HALOPERIDOL LACTATE 5 MG/ML IJ SOLN
2.0000 mg | Freq: Once | INTRAMUSCULAR | Status: AC
  Administered 2018-05-11: 2 mg via INTRAVENOUS

## 2018-05-11 MED ORDER — DILTIAZEM HCL-DEXTROSE 100-5 MG/100ML-% IV SOLN (PREMIX)
5.0000 mg/h | INTRAVENOUS | Status: DC
  Administered 2018-05-11: 15 mg/h via INTRAVENOUS
  Administered 2018-05-11: 10 mg/h via INTRAVENOUS
  Administered 2018-05-11 (×2): 15 mg/h via INTRAVENOUS
  Administered 2018-05-11: 5 mg/h via INTRAVENOUS
  Administered 2018-05-12 – 2018-05-13 (×3): 15 mg/h via INTRAVENOUS
  Filled 2018-05-11 (×10): qty 100

## 2018-05-11 MED ORDER — FAT EMULSION PLANT BASED 20 % IV EMUL
250.0000 mL | INTRAVENOUS | Status: AC
  Administered 2018-05-11: 250 mL via INTRAVENOUS
  Filled 2018-05-11: qty 250

## 2018-05-11 NOTE — Progress Notes (Signed)
Nutrition Follow-up  DOCUMENTATION CODES:   Non-severe (moderate) malnutrition in context of chronic illness  INTERVENTION:  Continue Clinimix E 5/15 at 65 mL/hr. Resume 20% ILE at 20 mL/hr over 12 hours. Goal regimen provides 1588 kcal, 78 grams of protein, 1560 mL fluid daily (+240 mL fluid from lipids).  Continue adult MVI and trace elements as daily TPN additives.  Continue daily weights.  NUTRITION DIAGNOSIS:   Moderate Malnutrition related to chronic illness(hx breast cancer, CHF, COPD) as evidenced by mild fat depletion, moderate fat depletion, mild muscle depletion, moderate muscle depletion.  Ongoing - addressing with TPN.  GOAL:   Provide needs based on ASPEN/SCCM guidelines  Met with TPN.  MONITOR:   PO intake, Supplement acceptance, Labs, Weight trends, I & O's  REASON FOR ASSESSMENT:   Malnutrition Screening Tool, Consult Assessment of nutrition requirement/status  ASSESSMENT:   62 year old female with PMHx of arthritis, HTN, COPD, anxiety, depression, CHF, breast cancer s/p left mastectomy and chemotherapy/XRT, lymphedema who is now admitted with severe hypoxic and hypercapnic respiratory failure initially requiring BiPAP, CHF exacerbation, radiation pneumonitis/fibrosis.   -On evening of 7/20 patient had 3 large bloody stools. Hgb dropped to 5.2 and patient received 2 units pRBC emergently. Developed hypovolemic/hemorrhagic shock secondary to acute blood loss anemia and was transferred to ICU. -Underwent exploratory laparotomy, right hemicolectomy with end ileostomy and mucus fistula on 7/21 in setting of hepatic flexure bleeding ulcer. Patient was initially extubated following operation but developed acute respiratory failure requiring re-intubation 7/21. -Underwent nuclear medicine GI bleeding scan on 7/23 that found no evidence of GI bleed. -Underwent EGD 7/24 which found mucosal changes in esophagus consistent with a healing mucosal tear. -Patient  extubated on 7/25. -Patient removed her NGT on 7/26.  Per Surgeon's note from today patient still not having air per ostomy so plan is to continue NPO status with TPN. Patient may have post-operative ileus.  Met with patient and bedside. No family members present at time of RD assessment. Abdomen remains distended. Patient reports she has still not had any ileostomy output.   IV Access: right brachial double lumen PICC placed 7/19; ECG technology used to confirm PICC terminates at [facility]  TPN: pt tolerating Clinimix E 5/15 at 65 mL/hr  Medications reviewed and include: Novolog 0-9 units Q4hrs (received 3 units past 24 hrs), diltiazem gtt, famotidine.  Labs reviewed: CBG 96-133 past 24 hrs.  I/O: 1000 mL UOP yesterday (0.6 mL/kg/hr)  Weight trend: 71.2 kg on 7/28; +11 kg from admission  Diet Order:   Diet Order           Diet NPO time specified  Diet effective now        Diet - low sodium heart healthy        Diet - low sodium heart healthy        Diet - low sodium heart healthy          EDUCATION NEEDS:   Education needs have been addressed  Skin:  Skin Assessment: Reviewed RN Assessment  Last BM:  05/04/2018 - large bloody BM; now with ileostomy  Height:   Ht Readings from Last 1 Encounters:  05/04/18 '5\' 6"'$  (1.676 m)    Weight:   Wt Readings from Last 1 Encounters:  05/11/18 157 lb (71.2 kg)    Ideal Body Weight:  59.1 kg  BMI:  Body mass index is 25.34 kg/m.  Estimated Nutritional Needs:   Kcal:  1505-1805 (25-30 kcal/kg)  Protein:  78-90 grams (  1.3-1.5 grams/kg)  Fluid:  1.5-1.8 L/day (25-30 mL/kcal)  Willey Blade, MS, RD, LDN Office: [phone] Pager: [phone] After Hours/Weekend Pager: [phone]

## 2018-05-11 NOTE — Plan of Care (Signed)
  Problem: Clinical Measurements: Goal: Ability to maintain clinical measurements within normal limits will improve Outcome: Progressing Goal: Cardiovascular complication will be avoided Outcome: Progressing   

## 2018-05-11 NOTE — Progress Notes (Signed)
[facility] [place] NAME: Danielle [name]    MR#:  [id number]  DATE OF BIRTH:  September 18, 1956  post extubation 05/08/18.  Responding to verbal commands.  Family at bedside.  Appears comfortable. Pain tolerable Hemodynamically stable.  CHIEF COMPLAINT:   Chief Complaint  Patient presents with  . Shortness of Breath   Patient is disoriented today .  Went into A. fib with RVR and given Cardizem and transferred to telemetry unit REVIEW OF SYSTEMS:  Review of system unobtainable as the patient is very delirious   DRUG ALLERGIES:  No Known Allergies VITALS:  Blood pressure (!) 149/96, pulse 85, temperature 98.6 F (37 C), temperature source Oral, resp. rate 16, height 5\' 6"  (1.676 m), weight 71.2 kg (157 lb), SpO2 94 %. PHYSICAL EXAMINATION:  Physical Exam  Constitutional: She appears well-nourished. She is sedated and not intubated.  Critically ill.  HENT:  Head: Normocephalic.  Mouth/Throat: Oropharynx is clear and moist.  Eyes: Conjunctivae are normal. No scleral icterus.  Neck: No tracheal deviation present.  Cardiovascular: Normal heart sounds. Exam reveals no gallop.  No murmur heard. Pulmonary/Chest: Effort normal. No stridor. She is not intubated. No respiratory distress. She has no wheezes. She has no rales.  Abdominal: There is no rebound.  Status post surgery,  patient has ileostomy.  Musculoskeletal: She exhibits edema. She exhibits no tenderness.  Bilateral leg edema  Neurological: No cranial nerve deficit.  Skin: No rash noted. No erythema.   LABORATORY PANEL:  Female CBC Recent Labs  Lab 05/11/18 0520  WBC 12.8*  HGB 10.0*  HCT 29.7*  PLT 274   ------------------------------------------------------------------------------------------------------------------ Chemistries  Recent Labs  Lab 05/08/18 0428  05/10/18 0653 05/11/18 0520  NA 137   < > 136  --   K 4.0   < > 3.8  --   CL 106   < > 101  --   CO2 27   < > 27  --     GLUCOSE 113*   < > 167*  --   BUN 35*   < > 26*  --   CREATININE 1.02*   < > 0.60  --   CALCIUM 7.8*   < > 8.7*  --   MG 2.0  --  1.4* 2.4  AST 48*  --   --   --   ALT 30  --   --   --   ALKPHOS 49  --   --   --   BILITOT 2.3*  --   --   --    < > = values in this interval not displayed.   RADIOLOGY:  No results found. ASSESSMENT AND PLAN:   64F acute hypoxemic respiratory failure, acute COPD exacerbation, dehydration, hypokalemia, hypoglycemia, renal failure, hypocalcemia, hypoalbuminemia, transaminasemia/hyperbilirubinemia, BNP elevation, Troponin-I elevation, macrocytic anemia.  Acute encephalopathy-unclear etiology WBC is trending down 15.8-12.8, afebrile We will order CT scan of the abdomen with contrast to rule out intra-abdominal abscess Will obtain lactic acid and procalcitonin Discussed with Dr. Peyton Najjar  Moderate Malnutritionrelated to chronic illness. Started TPN due to ischemic colitis.  Hypomagnesemia repleted and recheck 2.4  Ischemic colitis status post right-sided hemicolectomy and ileostomy.  Followed by surgery.  TPN, n.p.o.  Hemorrhagic shock';s status post ex lap with right hemicolectomy,  bleeding scan, EGD unremarkable.,  Hb  stable after multiple units of blood transfusion, stable around 8.4.  Watch closely.  Patient extubated,  Patient on Zosyn for intra-abdominal coverage, WBC down from  30-14.  she is afebrile.   New onset A. fib with RVR. Cardizem IV as needed currently rate controlled  .Acute on chronic systolic CHF LV EF: 76% - 40%.  noAnticoagulation due to GI bleed. ` .Acute renal failure due to ATN from hemorrhagic shock: resolved.  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 Rogue Bussing     high risk for cardiac arrest, prognosis  poor,, Called pt daughter chiffon [phone] and updated over phone All the records are reviewed and case discussed with Care Management/Social Worker. CODE STATUS: Full Code  TOTAL TIME TAKING CARE OF THIS PATIENT:  38 min More than 50% of the time was spent in counseling/coordination of care: YES  POSSIBLE D/C IN ?  DAYS, DEPENDING ON CLINICAL CONDITION.   Nicholes Mango M.D on 05/11/2018 at 2:37 PM  Between 7am to 6pm - Pager - [phone]  After 6pm go to [email or web address] - [facility]

## 2018-05-11 NOTE — Consult Note (Signed)
PHARMACY - ADULT TOTAL PARENTERAL NUTRITION CONSULT NOTE   Pharmacy Consult for TPN Indication: w/out adequate nutrition for > 7 days  Patient Measurements: Height: 5\' 6"  (167.6 cm) Weight: 157 lb (71.2 kg) IBW/kg (Calculated) : 59.3 TPN AdjBW (KG): 72.2 Body mass index is 25.34 kg/m.  Assessment:   GI:  Endo:  Insulin requirements in the past 24 hours: 0 units Lytes:  Sodium (mmol/L)  Date Value  05/10/2018 136  02/09/2015 132 (L)   Potassium (mmol/L)  Date Value  05/10/2018 3.8  02/09/2015 3.8   Magnesium (mg/dL)  Date Value  05/11/2018 2.4  12/07/2013 1.8   Phosphorus (mg/dL)  Date Value  05/08/2018 3.9   Calcium (mg/dL)  Date Value  05/10/2018 8.7 (L)   Calcium, Total (mg/dL)  Date Value  02/09/2015 9.1   Albumin (g/dL)  Date Value  05/08/2018 1.9 (L)  02/09/2015 4.3     TPN Access: ordered 7/19 TPN start date: Nutritional Goals (per RD recommendation on 7/19): [id number] Protein: 78 Fluid:1800  Goal TPN rate is 65 ml/hr  Current Nutrition:   Plan:  Continue Clinimix E 5/15 @ 41ml/hr; initiate lipids 68mL/hr on 7/26.  Add multivit and trace elements SSI sensitive q 4 hr Daily weights, strict in and outs Monitor closely w/ CHF- potentially fluid overloaded Electrolytes WNL Will check K, Mg, Phos as indicated, at least biweekly TG, LFT, albumin, CBC weeklyt Follow up tomorrow AM  Tu Shimmel D, RPH 05/11/2018,10:13 AM

## 2018-05-11 NOTE — Progress Notes (Signed)
PT Cancellation Note  Patient Details Name: FREDRICKA KOHRS MRN: [id number] DOB: [birth year]   Cancelled Treatment:    Reason Eval/Treat Not Completed: Medical issues which prohibited therapy. Order received.  Chart reviewed.  According to RN, pt is still very confused and tachycardic.  Will re-attempt later if appropriate.   Roxanne Gates, PT, DPT 05/11/2018, 2:16 PM

## 2018-05-11 NOTE — Progress Notes (Signed)
Patient ID: Danielle [name], female   DOB: 1956/05/07, 62 y.o.   MRN: [id number]     [facility] Hospital Day(s): 20.   Post op day(s): 4 Days Post-Op.   Interval History: Patient seen and examined. Patient with episode of atrial fibrillation yesterday. Difficult to control. Patient unable to give me any history of complain due to dementia.   Vital signs in last 24 hours: [min-max] current  Temp:  [98 F (36.7 C)-98.6 F (37 C)] 98.6 F (37 C) (07/28 0901) Pulse Rate:  [71-149] 86 (07/28 0945) Resp:  [16-20] 16 (07/28 0750) BP: (114-161)/(86-125) 133/99 (07/28 0945) SpO2:  [95 %-100 %] 95 % (07/28 0901) Weight:  [71.2 kg (157 lb)] 71.2 kg (157 lb) (07/28 0506)     Height: 5\' 6"  (167.6 cm) Weight: 71.2 kg (157 lb) BMI (Calculated): 25.35    Physical Exam:  Constitutional: alert, cooperative Gastrointestinal: soft, non-tender, and mild-distended. Ostomy pink and patent.Wounds dry and clean.   Labs:  CBC Latest Ref Rng & Units 05/11/2018 05/10/2018 05/09/2018  WBC 3.6 - 11.0 K/uL 12.8(H) 15.8(H) 11.3(H)  Hemoglobin 12.0 - 16.0 g/dL 10.0(L) 11.1(L) 8.4(L)  Hematocrit 35.0 - 47.0 % 29.7(L) 33.0(L) 24.7(L)  Platelets 150 - 440 K/uL 274 282 183   CMP Latest Ref Rng & Units 05/10/2018 05/09/2018 05/08/2018  Glucose 70 - 99 mg/dL 167(H) 100(H) 113(H)  BUN 8 - 23 mg/dL 26(H) 30(H) 35(H)  Creatinine 0.44 - 1.00 mg/dL 0.60 0.80 1.02(H)  Sodium 135 - 145 mmol/L 136 138 137  Potassium 3.5 - 5.1 mmol/L 3.8 4.0 4.0  Chloride 98 - 111 mmol/L 101 102 106  CO2 22 - 32 mmol/L 27 32 27  Calcium 8.9 - 10.3 mg/dL 8.7(L) 8.6(L) 7.8(L)  Total Protein 6.5 - 8.1 g/dL - - 4.6(L)  Total Bilirubin 0.3 - 1.2 mg/dL - - 2.3(H)  Alkaline Phos 38 - 126 U/L - - 49  AST 15 - 41 U/L - - 48(H)  ALT 0 - 44 U/L - - 30    Imaging studies: Abdominal xray with signs of post op ileus   Assessment/Plan:  62 y.o.femalewithbleeding hepatic flexure ischemic colitis status post right hemicolectomy with end  ileostomy and mucus fistula POD #7,complicated by pertinent comorbiditiesincluding afib on anticoagulation,HTN,severe COPDon home supplemental oxygen prn,Right lung cancer s/p radiation with known Right hilar mass, cardiac murmur, lymphedema, generalized anxiety disorder, major depression disorder, and history of Left breast cancer s/p mastectomy and chemoradiation. Patient deteriorated medically yesterday with atrial fibrillation. Hemoglobin today is 10, no sign of bleeding. Still not having air per ostomy. Will keep NPO. Patient removed NGT yesterday and is distended. Most likely post op ileus. No vomiting. If does not improve will consider CT scan when better control of atrial fibrillation. Continue with TPN.   Arnold Long, MD    Arnold Long, MD

## 2018-05-11 NOTE — Progress Notes (Signed)
Patient heart rate sustaining in 130s. Given one time dose Cardiziem IV push and Lopressor IV. Currently in Afib. Notified MD and he ordered for patient to be transferred out unit. Will continue to monitor.

## 2018-05-12 LAB — CBC
HCT: 24.3 % — ABNORMAL LOW (ref 35.0–47.0)
Hemoglobin: 8.2 g/dL — ABNORMAL LOW (ref 12.0–16.0)
MCH: 31.3 pg (ref 26.0–34.0)
MCHC: 33.7 g/dL (ref 32.0–36.0)
MCV: 93 fL (ref 80.0–100.0)
PLATELETS: 274 10*3/uL (ref 150–440)
RBC: 2.61 MIL/uL — ABNORMAL LOW (ref 3.80–5.20)
RDW: 16.8 % — ABNORMAL HIGH (ref 11.5–14.5)
WBC: 10 10*3/uL (ref 3.6–11.0)

## 2018-05-12 LAB — COMPREHENSIVE METABOLIC PANEL
ALBUMIN: 2.1 g/dL — AB (ref 3.5–5.0)
ALK PHOS: 70 U/L (ref 38–126)
ALT: 47 U/L — AB (ref 0–44)
AST: 58 U/L — AB (ref 15–41)
Anion gap: 7 (ref 5–15)
BILIRUBIN TOTAL: 1.3 mg/dL — AB (ref 0.3–1.2)
BUN: 28 mg/dL — AB (ref 8–23)
CO2: 29 mmol/L (ref 22–32)
CREATININE: 0.71 mg/dL (ref 0.44–1.00)
Calcium: 8.3 mg/dL — ABNORMAL LOW (ref 8.9–10.3)
Chloride: 100 mmol/L (ref 98–111)
GFR calc Af Amer: >60 mL/min (ref >60)
GLUCOSE: 178 mg/dL — AB (ref 70–99)
POTASSIUM: 4 mmol/L (ref 3.5–5.1)
Sodium: 136 mmol/L (ref 135–145)
TOTAL PROTEIN: 5.1 g/dL — AB (ref 6.5–8.1)

## 2018-05-12 LAB — MAGNESIUM: MAGNESIUM: 1.7 mg/dL (ref 1.7–2.4)

## 2018-05-12 LAB — GLUCOSE, CAPILLARY
GLUCOSE-CAPILLARY: 128 mg/dL — AB (ref 70–99)
GLUCOSE-CAPILLARY: 122 mg/dL — AB (ref 70–99)
Glucose-Capillary: 106 mg/dL — ABNORMAL HIGH (ref 70–99)
Glucose-Capillary: 115 mg/dL — ABNORMAL HIGH (ref 70–99)
Glucose-Capillary: 121 mg/dL — ABNORMAL HIGH (ref 70–99)
Glucose-Capillary: 129 mg/dL — ABNORMAL HIGH (ref 70–99)

## 2018-05-12 LAB — TRIGLYCERIDES: Triglycerides: 55 mg/dL (ref <150)

## 2018-05-12 LAB — PHOSPHORUS: Phosphorus: 4.7 mg/dL — ABNORMAL HIGH (ref 2.5–4.6)

## 2018-05-12 MED ORDER — INSULIN ASPART 100 UNIT/ML ~~LOC~~ SOLN
0.0000 [IU] | Freq: Four times a day (QID) | SUBCUTANEOUS | Status: DC
  Administered 2018-05-12 – 2018-05-14 (×3): 1 [IU] via SUBCUTANEOUS
  Administered 2018-05-15: 2 [IU] via SUBCUTANEOUS
  Administered 2018-05-15 – 2018-05-17 (×7): 1 [IU] via SUBCUTANEOUS
  Filled 2018-05-12 (×11): qty 1

## 2018-05-12 MED ORDER — FAT EMULSION PLANT BASED 20 % IV EMUL
250.0000 mL | INTRAVENOUS | Status: AC
  Administered 2018-05-12: 250 mL via INTRAVENOUS
  Filled 2018-05-12: qty 250

## 2018-05-12 MED ORDER — TRACE MINERALS CR-CU-MN-SE-ZN 10-1000-500-60 MCG/ML IV SOLN
INTRAVENOUS | Status: AC
  Administered 2018-05-12: 18:00:00 via INTRAVENOUS
  Filled 2018-05-12: qty 1560

## 2018-05-12 MED ORDER — MAGNESIUM SULFATE 2 GM/50ML IV SOLN
2.0000 g | Freq: Once | INTRAVENOUS | Status: AC
Start: 2018-05-12 — End: 2018-05-12
  Administered 2018-05-12: 2 g via INTRAVENOUS
  Filled 2018-05-12: qty 50

## 2018-05-12 NOTE — Progress Notes (Signed)
Patient ID: Danielle [name], female   DOB: [birth year], 62 y.o.   MRN: [id number]     [facility] Hospital Day(s): 21.   Post op day(s): 5 Days Post-Op.   Interval History: Patient seen and examined, no acute events or new complaints overnight. Patient reports feeling better. Denies nausea or vomiting.  Vital signs in last 24 hours: [min-max] current  Temp:  [97.5 F (36.4 C)-98.3 F (36.8 C)] 97.9 F (36.6 C) (07/29 1712) Pulse Rate:  [70-77] 70 (07/29 1712) Resp:  [16-17] 16 (07/29 0435) BP: (122-151)/(74-84) 140/82 (07/29 1712) SpO2:  [96 %-100 %] 98 % (07/29 1712)     Height: 5\' 6"  (167.6 cm) Weight: 71.2 kg (157 lb) BMI (Calculated): 25.35   Intake/Output this shift:  Ileostomy: 950 mL   Physical Exam:  Constitutional: alert, cooperative and no distress  Gastrointestinal: soft, non-tender, and non-distended. Ostomy pink and patent with significant water stool.   Labs:  CBC Latest Ref Rng & Units 05/12/2018 05/11/2018 05/10/2018  WBC 3.6 - 11.0 K/uL 10.0 12.8(H) 15.8(H)  Hemoglobin 12.0 - 16.0 g/dL 8.2(L) 10.0(L) 11.1(L)  Hematocrit 35.0 - 47.0 % 24.3(L) 29.7(L) 33.0(L)  Platelets 150 - 440 K/uL 274 274 282   CMP Latest Ref Rng & Units 05/12/2018 05/11/2018 05/10/2018  Glucose 70 - 99 mg/dL 178(H) - 167(H)  BUN 8 - 23 mg/dL 28(H) - 26(H)  Creatinine 0.44 - 1.00 mg/dL 0.71 - 0.60  Sodium 135 - 145 mmol/L 136 - 136  Potassium 3.5 - 5.1 mmol/L 4.0 4.1 3.8  Chloride 98 - 111 mmol/L 100 - 101  CO2 22 - 32 mmol/L 29 - 27  Calcium 8.9 - 10.3 mg/dL 8.3(L) - 8.7(L)  Total Protein 6.5 - 8.1 g/dL 5.1(L) - -  Total Bilirubin 0.3 - 1.2 mg/dL 1.3(H) - -  Alkaline Phos 38 - 126 U/L 70 - -  AST 15 - 41 U/L 58(H) - -  ALT 0 - 44 U/L 47(H) - -    Imaging studies:  EXAM: CT ABDOMEN AND PELVIS WITH CONTRAST  TECHNIQUE: Multidetector CT imaging of the abdomen and pelvis was performed using the standard protocol following bolus administration of intravenous  contrast.  CONTRAST:  138mL ISOVUE-300 IOPAMIDOL (ISOVUE-300) INJECTION 61%  COMPARISON:  CTA of the abdomen and pelvis on 05/01/2018  FINDINGS: Lower chest: Small bilateral pleural effusions, right greater than left.  Hepatobiliary: Diffuse hepatic steatosis. No biliary ductal dilatation. Gallbladder is unremarkable.  Pancreas: Unremarkable. No pancreatic ductal dilatation or surrounding inflammatory changes.  Spleen: Normal in size without focal abnormality.  Adrenals/Urinary Tract: Adrenal glands are unremarkable. Kidneys are normal, without renal calculi, focal lesion, or hydronephrosis. Bladder is unremarkable.  Stomach/Bowel: There are some gas-filled and mildly dilated small bowel loops with maximal caliber of 3.4 cm. Some of the fluid filled small bowel loops in the pelvis may be mildly thickened. However, this appearance may be accentuated by surrounding free fluid. No free intraperitoneal air identified. Ileostomy present without evidence of complication. Mucous fistula of the descending colon exits the left lower abdominal wall without evidence of complication.  Vascular/Lymphatic: Stable calcified aorta and visceral arteries without evidence of aneurysm. Mesenteric veins, portal vein and splenic vein are normally patent.  Reproductive: Uterus and bilateral adnexa are unremarkable.  Other: There is mild to moderate free fluid in the dependent pelvis. No evidence to suggest obvious focal marginated abscess. No evidence of hemoperitoneum. Diffuse body wall edema is consistent with anasarca.  Musculoskeletal: No acute or significant  osseous findings.  IMPRESSION: 1. Small bilateral pleural effusions, right greater than left. 2. Diffuse hepatic steatosis. 3. Mildly dilated small bowel loops likely reflects ileus without significant obstruction. Some small bowel loops in the pelvis may be mildly thickened. However, this appearance may be somewhat  related to the presence of surrounding free fluid. Ileostomy and mucous fistula show no evidence of complication. 4. Free fluid in the pelvis without marginated abscess. 5. Diffuse body wall edema is consistent with anasarca.   Electronically Signed   By: Aletta Edouard M.D.   On: 05/11/2018 17:32  Assessment/Plan:  62 y.o.femalewithbleeding hepatic flexure ischemic colitis status post right hemicolectomy with end ileostomy and mucus fistula POD #7,complicated by pertinent comorbiditiesincluding afib on anticoagulation,HTN,severe COPDon home supplemental oxygen prn,Right lung cancer s/p radiation with known Right hilar mass, cardiac murmur, lymphedema, generalized anxiety disorder, major depression disorder, and history of Left breast cancer s/p mastectomy and chemoradiation. Patient more alert and active today. There is significant fluid on ileostomy bag. Abdomen less distended. Will start patient on diet. Agree with physical therapy and current medical management.   Arnold Long, MD

## 2018-05-12 NOTE — Consult Note (Signed)
PHARMACY - ADULT TOTAL PARENTERAL NUTRITION CONSULT NOTE   Pharmacy Consult for TPN Indication: w/out adequate nutrition for > 7 days  Patient Measurements: Height: 5\' 6"  (167.6 cm) Weight: 157 lb (71.2 kg) IBW/kg (Calculated) : 59.3 TPN AdjBW (KG): 72.2 Body mass index is 25.34 kg/m.  Assessment:   GI:  Endo:  Insulin requirements in the past 24 hours: 5 units Lytes:  Sodium (mmol/L)  Date Value  05/12/2018 136  02/09/2015 132 (L)   Potassium (mmol/L)  Date Value  05/12/2018 4.0  02/09/2015 3.8   Magnesium (mg/dL)  Date Value  05/12/2018 1.7  12/07/2013 1.8   Phosphorus (mg/dL)  Date Value  05/12/2018 4.7 (H)   Calcium (mg/dL)  Date Value  05/12/2018 8.3 (L)   Calcium, Total (mg/dL)  Date Value  02/09/2015 9.1   Albumin (g/dL)  Date Value  05/12/2018 2.1 (L)  02/09/2015 4.3     TPN Access: ordered 7/19 TPN start date: Nutritional Goals (per RD recommendation on 7/19): [id number] Protein: 78 Fluid:1800  Goal TPN rate is 65 ml/hr  Current Nutrition:   Plan:  Continue Clinimix E 5/15 @ 29ml/hr; Continue lipids 50mL/hr x 12 hr Add multivit and trace elements Will change SSI to sensitive q 6 hr Daily weights, strict in and outs Monitor closely w/ CHF- potentially fluid overloaded Electrolytes WNL Will check K, Mg, Phos as indicated, at least biweekly TG, LFT, albumin, CBC weeklyt Follow up tomorrow AM  Ramond Dial, RPH 05/12/2018,11:43 AM

## 2018-05-12 NOTE — Progress Notes (Signed)
[facility] [place] NAME: Danielle [name]    MR#:  [id number]  DATE OF BIRTH:  1955-11-24  post extubation 05/08/18.  Responding to verbal commands.  Family at bedside.  Appears comfortable. Pain tolerable Hemodynamically stable.  CHIEF COMPLAINT:   Chief Complaint  Patient presents with  . Shortness of Breath   Patient is more awake and alert and answering questions appropriately.  Back to her baseline.  On Cardizem drip for atrial fibrillation with RVR as patient is currently n.p.o.  Reporting chest tightness  has history of COPD  REVIEW OF SYSTEMS:  Review of Systems  Constitutional: Negative forchillsand fever.  HENT: Negative forhearing loss.  Eyes: Negative forblurred vision,double visionand photophobia.  Respiratory: Denies any cough or shortness of breath negative forhemoptysis.  Cardiovascular: Negative forpalpitations,orthopneaand leg swelling.  Gastrointestinal: Negative forabdominal pain,diarrheaand vomiting.  Genitourinary: Negative fordysuriaand urgency.  Musculoskeletal: Negative formyalgiasand neck pain.  Skin: Negative forrash.  Neurological: Negative fordizziness,focal weakness,seizures,weaknessand headaches.  Psychiatric/Behavioral: Negative formemory loss. The patientdoes not have insomnia.    DRUG ALLERGIES:  No Known Allergies VITALS:  Blood pressure 122/74, pulse 73, temperature (!) 97.5 F (36.4 C), temperature source Oral, resp. rate 16, height 5\' 6"  (1.676 m), weight 71.2 kg (157 lb), SpO2 99 %. PHYSICAL EXAMINATION:  Physical Exam  Constitutional: She appears well-nourished. She is sedated and not intubated.  Critically ill.  HENT:  Head: Normocephalic.  Mouth/Throat: Oropharynx is clear and moist.  Eyes: Conjunctivae are normal. No scleral icterus.  Neck: No tracheal deviation present.  Cardiovascular: Normal heart sounds. Exam reveals no gallop.  No murmur heard. Pulmonary/Chest:  Effort normal. No stridor. She is not intubated. No respiratory distress. She has no wheezes. She has no rales.  Abdominal: There is no rebound.  Status post surgery,  patient has ileostomy.  Musculoskeletal: She exhibits edema. She exhibits no tenderness.  Bilateral leg edema  Neurological: No cranial nerve deficit.  Skin: No rash noted. No erythema.   LABORATORY PANEL:  Female CBC Recent Labs  Lab 05/12/18 1012  WBC 10.0  HGB 8.2*  HCT 24.3*  PLT 274   ------------------------------------------------------------------------------------------------------------------ Chemistries  Recent Labs  Lab 05/12/18 1012  NA 136  K 4.0  CL 100  CO2 29  GLUCOSE 178*  BUN 28*  CREATININE 0.71  CALCIUM 8.3*  MG 1.7  AST 58*  ALT 47*  ALKPHOS 70  BILITOT 1.3*   RADIOLOGY:  No results found. ASSESSMENT AND PLAN:   29F acute hypoxemic respiratory failure, acute COPD exacerbation, dehydration, hypokalemia, hypoglycemia, renal failure, hypocalcemia, hypoalbuminemia, transaminasemia/hyperbilirubinemia, BNP elevation, Troponin-I elevation, macrocytic anemia.  Acute encephalopathy-resolved WBC is trending down 15.8-12.8 -10 afebrile CT scan of the abdomen with contrast with ileus but no obstruction or abscess.  Has some free fluid  COPD-mild exacerbation  magnesium sulfate IV was given continue nebulizer treatments if necessary we will add steroids   Moderate Malnutritionrelated to chronic illness. on TPN due to ischemic colitis.  Hypomagnesemia repleted and recheck 2.4  Ischemic colitis status post right-sided hemicolectomy and ileostomy.  Followed by surgery.  TPN, n.p.o.  Hemorrhagic shock';s status post ex lap with right hemicolectomy,  bleeding scan, EGD unremarkable.,  Hb  stable after multiple units of blood transfusion, stable around 8.4.  Watch closely.  Patient extubated,  Patient on Zosyn for intra-abdominal coverage, WBC down from 30-14.  she is afebrile.   New  onset A. fib with RVR. Cardizem IV as needed currently rate controlled  .Acute on chronic  systolic CHF LV EF: 69% - 40%.  noAnticoagulation due to GI bleed. ` .Acute renal failure due to ATN from hemorrhagic shock: resolved.  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 Rogue Bussing     high risk for cardiac arrest, prognosis poor,, Called pt daughter chiffon [phone] and updated over phone All the records are reviewed and case discussed with Care Management/Social Worker. CODE STATUS: Full Code  TOTAL TIME TAKING CARE OF THIS PATIENT:  38 min More than 50% of the time was spent in counseling/coordination of care: YES  POSSIBLE D/C IN ?  DAYS, DEPENDING ON CLINICAL CONDITION.   Nicholes Mango M.D on 05/12/2018 at 3:53 PM  Between 7am to 6pm - Pager - [phone]  After 6pm go to [email or web address] - [facility]

## 2018-05-12 NOTE — Progress Notes (Signed)
PMT note:  Patient has been moved to floor from ICU. She is alert, and able to converse, improved from last visit.  She states she is not feeling well today, and states she does not know why.  Staff at bedside to provide bath. Will continue to follow.   No charge.

## 2018-05-12 NOTE — Progress Notes (Signed)
PT Cancellation Note  Patient Details Name: CAROLYNE WHITSEL MRN: [id number] DOB: September 09, 1956   Cancelled Treatment:    Reason Eval/Treat Not Completed: Fatigue/lethargy limiting ability to participate PT attempted and chart reviewed, but pt was lethargic and difficult to wake. Once awake pt had mild confusion, and refused PT. PT will reattempt at later date.  Rosario Adie, SPT    Rosario Adie 05/12/2018, 9:04 AM

## 2018-05-13 DIAGNOSIS — K567 Ileus, unspecified: Secondary | ICD-10-CM

## 2018-05-13 DIAGNOSIS — K9189 Other postprocedural complications and disorders of digestive system: Secondary | ICD-10-CM

## 2018-05-13 DIAGNOSIS — Z515 Encounter for palliative care: Secondary | ICD-10-CM

## 2018-05-13 LAB — GLUCOSE, CAPILLARY
GLUCOSE-CAPILLARY: 105 mg/dL — AB (ref 70–99)
GLUCOSE-CAPILLARY: 112 mg/dL — AB (ref 70–99)
GLUCOSE-CAPILLARY: 104 mg/dL — AB (ref 70–99)
Glucose-Capillary: 106 mg/dL — ABNORMAL HIGH (ref 70–99)
Glucose-Capillary: 125 mg/dL — ABNORMAL HIGH (ref 70–99)

## 2018-05-13 MED ORDER — FAT EMULSION PLANT BASED 20 % IV EMUL
250.0000 mL | INTRAVENOUS | Status: AC
  Administered 2018-05-13: 250 mL via INTRAVENOUS
  Filled 2018-05-13: qty 250

## 2018-05-13 MED ORDER — TRACE MINERALS CR-CU-MN-SE-ZN 10-1000-500-60 MCG/ML IV SOLN
INTRAVENOUS | Status: AC
  Administered 2018-05-13: 18:00:00 via INTRAVENOUS
  Filled 2018-05-13: qty 1560

## 2018-05-13 MED ORDER — ENSURE ENLIVE PO LIQD
237.0000 mL | Freq: Two times a day (BID) | ORAL | Status: DC
  Administered 2018-05-13: 237 mL via ORAL

## 2018-05-13 MED ORDER — SODIUM CHLORIDE 0.9% FLUSH
10.0000 mL | Freq: Two times a day (BID) | INTRAVENOUS | Status: DC
  Administered 2018-05-13 – 2018-05-17 (×8): 10 mL

## 2018-05-13 MED ORDER — GUAIFENESIN-DM 100-10 MG/5ML PO SYRP
5.0000 mL | ORAL_SOLUTION | ORAL | Status: DC | PRN
  Administered 2018-05-13 – 2018-05-14 (×3): 5 mL via ORAL
  Filled 2018-05-13 (×3): qty 5

## 2018-05-13 MED ORDER — DILTIAZEM HCL ER COATED BEADS 180 MG PO CP24
360.0000 mg | ORAL_CAPSULE | Freq: Every day | ORAL | Status: DC
  Administered 2018-05-13 – 2018-05-14 (×2): 360 mg via ORAL
  Filled 2018-05-13 (×3): qty 2

## 2018-05-13 MED ORDER — SODIUM CHLORIDE 0.9% FLUSH: 10.0000 mL | INTRAVENOUS | Status: DC | PRN

## 2018-05-13 NOTE — Evaluation (Signed)
Physical Therapy Evaluation Patient Details Name: Danielle [name] MRN: [id number] DOB: August 29, 1956 Today's Date: 05/13/2018   History of Present Illness   Pt is a 62 y.o. female with a known history of HTN, COPD (2L [place] O2 PRN), known R hilar mass, Hx breast Ca p/w cough/SOB, acute on chronic hypoxemic respiratory failure/acute COPD exacerbation. Pt is currently diagnosed with acute hypoxemic respiratory failure after being brought to ED via EMS. Pts hospital stay has been complicated by Exploratory Laparotomy for Right hemicolectomy with end ileostomy and mucus fistula post op day #8. after surgey pt was moved to ICU and intubated due to complications. Cardiology is monitoring and pt reamins on cartizam drip.  Clinical Impression  Pt is a pleasant 62 year old female who was admitted for acute hypoxemic respiratory failure with PMH above. Pt performs bed mobility with Min A at LEs and UE hand held assist, transfers sit to stand with Mod A hand held assist per patient prefrence, and ambulation deffered.  See below for specifics on mobility. Pt demonstrates deficits with functional mobility secondary to weakness, fatigue, cardiovascular capacity, and FOF. Pt would benefit from skilled PT to address above deficits and promote optimal return to PLOF. PT recommends d/c to SNF secondary to deficts stated above.      Follow Up Recommendations SNF    Equipment Recommendations  Rolling walker with 5" wheels;3in1 (PT)    Recommendations for Other Services       Precautions / Restrictions Precautions Precautions: Fall Restrictions Weight Bearing Restrictions: No      Mobility  Bed Mobility Overal bed mobility: Needs Assistance Bed Mobility: Supine to Sit;Sit to Supine     Supine to sit: Min assist Sit to supine: Supervision   General bed mobility comments: Pt requested assistance (Min A) for supine>sit secondary to fatigue, and cardiovascular capaicity. Pt was able to go sit>supine with  supervision. Pt was bale to roll side to side x4 with verbal cueing and supervision, but needed Min A x2 for slide up in bed.  Transfers Overall transfer level: Needs assistance Equipment used: 1 person hand held assist Transfers: Sit to/from Stand Sit to Stand: Mod assist         General transfer comment: Pt needed Mod A with sit<>stand secondary to weakness, cardiovascular capacity, and FOF. Pt prefered to stand with hand held as opposed to RW, but may have more success with RW. Pt had posterior lean with standing and did not correct with verbal/tactile cues. PT had to block L LE to keep from sliding out away posterior to anterior.  Ambulation/Gait Ambulation/Gait assistance: (unable to assess at this time)              Financial trader Rankin (Stroke Patients Only)       Balance Overall balance assessment: Needs assistance Sitting-balance support: Single extremity supported;Feet supported Sitting balance-Leahy Scale: Fair     Standing balance support: Bilateral upper extremity supported                                 Pertinent Vitals/Pain Pain Assessment: No/denies pain    Home Living Family/patient expects to be discharged to:: Private residence Living Arrangements: Children Available Help at Discharge: Family Type of Home: Mobile home Home Access: Stairs to enter Entrance Stairs-Rails: (pt not sure) Entrance Stairs-Number of Steps: 5 Home Layout:  One level   Additional Comments: pt is not great historian    Prior Function Level of Independence: Independent               Hand Dominance   Dominant Hand: Right    Extremity/Trunk Assessment   Upper Extremity Assessment Upper Extremity Assessment: Overall WFL for tasks assessed    Lower Extremity Assessment Lower Extremity Assessment: Generalized weakness(grossly 3+/5 MMT)       Communication   Communication: Expressive  difficulties(follows complex commands unable to answer complex questions)  Cognition Arousal/Alertness: Awake/alert Behavior During Therapy: WFL for tasks assessed/performed Overall Cognitive Status: No family/caregiver present to determine baseline cognitive functioning                                 General Comments: Pt had difficulty answering complex questions and staying on task.      General Comments      Exercises Other Exercises Other Exercises: pt instructed in supine there.ex to include ankle pumps x10, quad sets x10, Min A heel slides x10, Min A hip abd/add x5, and Min A SLR x5 all B.   Assessment/Plan    PT Assessment Patient needs continued PT services  PT Problem List Decreased strength;Decreased range of motion;Decreased activity tolerance;Decreased balance;Decreased mobility;Decreased knowledge of use of DME;Decreased safety awareness       PT Treatment Interventions DME instruction;Gait training;Stair training;Functional mobility training;Therapeutic activities;Therapeutic exercise;Balance training    PT Goals (Current goals can be found in the Care Plan section)  Acute Rehab PT Goals Patient Stated Goal: return to PLOF PT Goal Formulation: With patient Time For Goal Achievement: 05/27/18 Potential to Achieve Goals: Fair    Frequency Min 2X/week   Barriers to discharge Inaccessible home environment;Decreased caregiver support(unclear level of support at home)      Co-evaluation               AM-PAC PT "6 Clicks" Daily Activity  Outcome Measure Difficulty turning over in bed (including adjusting bedclothes, sheets and blankets)?: Unable Difficulty moving from lying on back to sitting on the side of the bed? : Unable Difficulty sitting down on and standing up from a chair with arms (e.g., wheelchair, bedside commode, etc,.)?: Unable Help needed moving to and from a bed to chair (including a wheelchair)?: A Lot Help needed walking in  hospital room?: A Lot Help needed climbing 3-5 steps with a railing? : Total 6 Click Score: 8    End of Session Equipment Utilized During Treatment: Gait belt;Oxygen Activity Tolerance: No increased pain;Patient limited by fatigue(patient limited by O2 saturation and FOF) Patient left: in bed;with bed alarm set;with call bell/phone within reach Nurse Communication: Mobility status PT Visit Diagnosis: Unsteadiness on feet (R26.81);Other abnormalities of gait and mobility (R26.89);Muscle weakness (generalized) (M62.81);Difficulty in walking, not elsewhere classified (R26.2)    Time: 1120-1212 PT Time Calculation (min) (ACUTE ONLY): 52 min   Charges:   PT Evaluation $PT Eval Moderate Complexity: 1 Mod PT Treatments $Therapeutic Exercise: 8-22 mins $Therapeutic Activity: 8-22 mins        Rosario Adie, SPT    Rosario Adie 05/13/2018, 4:34 PM

## 2018-05-13 NOTE — Plan of Care (Signed)
  Problem: Clinical Measurements: Goal: Ability to maintain clinical measurements within normal limits will improve Outcome: Not Progressing Note:  B.U.N. is elevated today at 28. Will continue to monitor renal function. Wenda Low [facility]

## 2018-05-13 NOTE — Progress Notes (Addendum)
Daily Progress Note   Patient Name: Danielle [name]       Date: 05/13/2018 DOB: 1956/06/24  Age: 62 y.o. MRN#: [id number] Attending Physician: Nicholes Mango, MD Primary Care Physician: Elisabeth Cara, NP Admit Date: 04/21/2018  Reason for Consultation/Follow-up: Support  Subjective: Patient resting in bed watching t.v.  No family at bedside. She was extubated 7/25. She remains on 4 lpm O2. TPN remains in place. She is on a full liquid diet, and states she vomited her Ensure drink this morning.   She states her abdominal pain has improved and is controlled by Tylenol. She states the ileostomy is a lot for her, but she is learning how to manage it. She states she is widowed and one of her children live with her, but goes to work early and gets off late. She is unsure that she will have help at home.         Length of Stay: 22  Current Medications: Scheduled Meds:  . budesonide (PULMICORT) nebulizer solution  0.5 mg Nebulization BID  . diltiazem  360 mg Oral Daily  . feeding supplement (ENSURE ENLIVE)  237 mL Oral BID BM  . haloperidol  2 mg Oral Once  . insulin aspart  0-9 Units Subcutaneous Q6H  . ipratropium-albuterol  3 mL Nebulization TID  . mouth rinse  15 mL Mouth Rinse BID  . sodium chloride flush  10-40 mL Intracatheter Q12H    Continuous Infusions: . Marland KitchenTPN (CLINIMIX-E) Adult 65 mL/hr at 05/12/18 1819  . Marland KitchenTPN (CLINIMIX-E) Adult    . famotidine (PEPCID) IV Stopped (05/13/18 0945)  . Fat emulsion      PRN Meds: acetaminophen **OR** acetaminophen, bisacodyl, guaiFENesin-dextromethorphan, ipratropium-albuterol, menthol-cetylpyridinium, metoprolol tartrate, morphine injection, [DISCONTINUED] ondansetron **OR** ondansetron (ZOFRAN) IV, sodium chloride flush  Physical Exam    Constitutional: No distress.  Pulmonary/Chest: Effort normal.  Neurological: She is alert.  Skin: Skin is warm and dry.            Vital Signs: BP (!) 144/84 (BP Location: Right Leg)   Pulse 78   Temp 98.5 F (36.9 C) (Oral)   Resp 19   Ht 5\' 6"  (1.676 m)   Wt 71.2 kg (157 lb)   SpO2 99%   BMI 25.34 kg/m  SpO2: SpO2: 99 % O2 Device: O2 Device: Nasal Cannula  O2 Flow Rate: O2 Flow Rate (L/min): 4 L/min  Intake/output summary:   Intake/Output Summary (Last 24 hours) at 05/13/2018 1438 Last data filed at 05/13/2018 1259 Gross per 24 hour  Intake 3173 ml  Output 1150 ml  Net 2023 ml   LBM: Last BM Date: 05/10/18 Baseline Weight: Weight: 63 kg (139 lb) Most recent weight: Weight: 71.2 kg (157 lb)       Palliative Assessment/Data: TPN.       Patient Active Problem List   Diagnosis Date Noted  . Blood in stool   . Focal (segmental) acute (reversible) ischemia of large intestine ([place])   . Ulceration of intestine   . Abdominal pain, right upper quadrant   . Diverticulosis of large intestine without diverticulitis   . Abnormal CT scan, colon   . Generalized abdominal pain   . Colitis   . COPD exacerbation ([place])   . Malnutrition of moderate degree 04/23/2018  . Palliative care by specialist   . DNR (do not resuscitate) discussion   . Weakness generalized   . Acute hypoxemic respiratory failure ([place]) 04/21/2018  . Hyponatremia 02/10/2017  . Syncope 02/09/2017  . Lung nodule, solitary 07/25/2016  . Malignant neoplasm of upper lobe of right lung ([place]) 07/25/2016  . Carcinoma of overlapping sites of left breast in female, estrogen receptor positive ([place]) 06/22/2016  . Multiple lung nodules 06/22/2016  . Cough   . Lesion of right lung   . Breast cancer in female [facility]) 02/21/2016  . DOE (dyspnea on exertion) 02/14/2016  . Moderate COPD (chronic obstructive pulmonary disease) ([place]) 08/24/2014    Palliative Care Assessment & Plan     Assessment/Recommendations/Plan:  Support offered to patient.   Recommend palliative at D/C.      Code Status:    Code Status Orders  (From admission, onward)        Start     Ordered   04/21/18 0634  Full code  Continuous     04/21/18 0633    Code Status History    Date Active Date Inactive Code Status Order ID Comments User Context   02/09/2017 1125 02/11/2017 1706 Full Code [id number]  Henreitta Leber, MD Inpatient       Prognosis:   Unable to determine  Discharge Planning:  To Be Determined   Thank you for allowing the Palliative Medicine Team to assist in the care of this patient.   Total Time 25 min Prolonged Time Billed  No      Greater than 50%  of this time was spent counseling and coordinating care related to the above assessment and plan.  Asencion Gowda, NP  Please contact Palliative Medicine Team phone at [phone] for questions and concerns.

## 2018-05-13 NOTE — Progress Notes (Signed)
[place] at [place] NAME: Danielle [name]    MR#:  [id number]  DATE OF BIRTH:  02-28-1956  post extubation 05/08/18.  Responding to verbal commands.  Family at bedside.  Appears comfortable. Pain tolerable Hemodynamically stable.  CHIEF COMPLAINT:   Chief Complaint  Patient presents with  . Shortness of Breath   Patient is more awake and alert and answering questions appropriately.  Back to her baseline.  Vomited after drinking Ensure.  Wean off Cardizem drip for atrial fibrillation  REVIEW OF SYSTEMS:  Review of Systems  Constitutional: Negative forchillsand fever.  HENT: Negative forhearing loss.  Eyes: Negative forblurred vision,double visionand photophobia.  Respiratory: Denies any cough or shortness of breath negative forhemoptysis.  Cardiovascular: Negative forpalpitations,orthopneaand leg swelling.  Gastrointestinal: Negative forabdominal pain,diarrheaand vomiting.  Genitourinary: Negative fordysuriaand urgency.  Musculoskeletal: Negative formyalgiasand neck pain.  Skin: Negative forrash.  Neurological: Negative fordizziness,focal weakness,seizures,weaknessand headaches.  Psychiatric/Behavioral: Negative formemory loss. The patientdoes not have insomnia.    DRUG ALLERGIES:  No Known Allergies VITALS:  Blood pressure (!) 144/84, pulse 78, temperature 98.5 F (36.9 C), temperature source Oral, resp. rate 19, height 5\' 6"  (1.676 m), weight 71.2 kg (157 lb), SpO2 99 %. PHYSICAL EXAMINATION:  Physical Exam  Constitutional: She appears well-nourished. She is sedated and not intubated.  Critically ill.  HENT:  Head: Normocephalic.  Mouth/Throat: Oropharynx is clear and moist.  Eyes: Conjunctivae are normal. No scleral icterus.  Neck: No tracheal deviation present.  Cardiovascular: Normal heart sounds. Exam reveals no gallop.  No murmur heard. Pulmonary/Chest: Effort normal. No stridor. She is not intubated.  No respiratory distress. She has no wheezes. She has no rales.  Abdominal: There is no rebound.  Status post surgery,  patient has ileostomy.  Musculoskeletal: She exhibits edema. She exhibits no tenderness.  Bilateral leg edema  Neurological: No cranial nerve deficit.  Skin: No rash noted. No erythema.   LABORATORY PANEL:  Female CBC Recent Labs  Lab 05/12/18 1012  WBC 10.0  HGB 8.2*  HCT 24.3*  PLT 274   ------------------------------------------------------------------------------------------------------------------ Chemistries  Recent Labs  Lab 05/12/18 1012  NA 136  K 4.0  CL 100  CO2 29  GLUCOSE 178*  BUN 28*  CREATININE 0.71  CALCIUM 8.3*  MG 1.7  AST 58*  ALT 47*  ALKPHOS 70  BILITOT 1.3*   RADIOLOGY:  No results found. ASSESSMENT AND PLAN:   65F acute hypoxemic respiratory failure, acute COPD exacerbation, dehydration, hypokalemia, hypoglycemia, renal failure, hypocalcemia, hypoalbuminemia, transaminasemia/hyperbilirubinemia, BNP elevation, Troponin-I elevation, macrocytic anemia.  Acute encephalopathy-resolved WBC is trending down 15.8-12.8 -10 afebrile CT scan of the abdomen with contrast with ileus but no obstruction or abscess.  Has some free fluid  COPD-mild exacerbation  Improved magnesium sulfate IV was given once  continue nebulizer treatments if necessary we will add steroids   Moderate Malnutritionrelated to chronic illness. on TPN due to ischemic colitis.  Hypomagnesemia repleted and recheck 2.4  Ischemic colitis status post right-sided hemicolectomy and ileostomy.  Followed by surgery.  TPN, on soft diet, started 05/12/2018  Hemorrhagic shock';s status post ex lap with right hemicolectomy,  bleeding scan, EGD unremarkable.,  Hb  stable after multiple units of blood transfusion, stable around 8.4.  Watch closely.  Patient extubated,  Patient on Zosyn for intra-abdominal coverage, WBC down from 30-14.  she is afebrile.   New onset A.  fib with RVR. Cardizem IV as needed currently rate controlled  .Acute on chronic systolic CHF LV  EF: 35% - 40%.  noAnticoagulation due to GI bleed. ` .Acute renal failure due to ATN from hemorrhagic shock: resolved.  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 Rogue Bussing     high risk for cardiac arrest, prognosis poor,, Called pt daughter chiffon [phone] and updated over phone All the records are reviewed and case discussed with Care Management/Social Worker. CODE STATUS: Full Code  TOTAL TIME TAKING CARE OF THIS PATIENT:  28 min More than 50% of the time was spent in counseling/coordination of care: YES  POSSIBLE D/C IN ?  DAYS, DEPENDING ON CLINICAL CONDITION.   Nicholes Mango M.D on 05/13/2018 at 3:59 PM  Between 7am to 6pm - Pager - [phone]  After 6pm go to [email or web address] - [facility]

## 2018-05-13 NOTE — Care Management (Signed)
Transferred from [facility] for atrial fib.  Patient currently on cardizem drip.  Remains on TPN and Full liquids

## 2018-05-13 NOTE — Progress Notes (Signed)
Patient ID: Danielle [name], female   DOB: Sep 30, 1956, 62 y.o.   MRN: [id number]     [place] Hospital Day(s): 22.   Post op day(s): 6 Days Post-Op.   Interval History: Patient seen and examined, no acute events or new complaints overnight. Patient reports difficulty breathing, basically the same complain since before the surgery, denies abdominal pain, nausea and vomiting.   Vital signs in last 24 hours: [min-max] current  Temp:  [97.9 F (36.6 C)-98.5 F (36.9 C)] 98.3 F (36.8 C) (07/30 1919) Pulse Rate:  [78-119] 119 (07/30 1919) Resp:  [18-19] 18 (07/30 1919) BP: (132-146)/(84-106) 132/106 (07/30 1919) SpO2:  [96 %-100 %] 100 % (07/30 2025) Weight:  [72.4 kg (159 lb 9.8 oz)] 72.4 kg (159 lb 9.8 oz) (07/30 1700)     Height: 5\' 6"  (167.6 cm) Weight: 72.4 kg (159 lb 9.8 oz) BMI (Calculated): 25.77    Physical Exam:  Constitutional: alert, cooperative and no distress   Respiratory: breathing non-labored at rest  Cardiovascular: regular rate and sinus rhythm  Gastrointestinal: soft, non-tender, and non-distended. Wound dry and clean. Ostomy pink and patent.   Labs:  CBC Latest Ref Rng & Units 05/12/2018 05/11/2018 05/10/2018  WBC 3.6 - 11.0 K/uL 10.0 12.8(H) 15.8(H)  Hemoglobin 12.0 - 16.0 g/dL 8.2(L) 10.0(L) 11.1(L)  Hematocrit 35.0 - 47.0 % 24.3(L) 29.7(L) 33.0(L)  Platelets 150 - 440 K/uL 274 274 282   CMP Latest Ref Rng & Units 05/12/2018 05/11/2018 05/10/2018  Glucose 70 - 99 mg/dL 178(H) - 167(H)  BUN 8 - 23 mg/dL 28(H) - 26(H)  Creatinine 0.44 - 1.00 mg/dL 0.71 - 0.60  Sodium 135 - 145 mmol/L 136 - 136  Potassium 3.5 - 5.1 mmol/L 4.0 4.1 3.8  Chloride 98 - 111 mmol/L 100 - 101  CO2 22 - 32 mmol/L 29 - 27  Calcium 8.9 - 10.3 mg/dL 8.3(L) - 8.7(L)  Total Protein 6.5 - 8.1 g/dL 5.1(L) - -  Total Bilirubin 0.3 - 1.2 mg/dL 1.3(H) - -  Alkaline Phos 38 - 126 U/L 70 - -  AST 15 - 41 U/L 58(H) - -  ALT 0 - 44 U/L 47(H) - -    Imaging studies: No new pertinent  imaging studies   Assessment/Plan:  62 y.o.femalewithbleeding hepatic flexure ischemic colitis status post right hemicolectomy with end ileostomy and mucus fistula POD #7,complicated by pertinent comorbiditiesincluding afib on anticoagulation,HTN,severe COPDon home supplemental oxygen prn,Right lung cancer s/p radiation with known Right hilar mass, cardiac murmur, lymphedema, generalized anxiety disorder, major depression disorder, and history of Left breast cancer s/p mastectomy and chemoradiation. Patient still alert and active, receiving physical therapy. Unable to assess how much diet patient is eating. Ostomy continue to work properly. Will advance diet to soft to see if patient eats more. Discussed with dietician that should stay on TPN until we know that the patient is meeting her calories with oral intake.    Arnold Long, MD

## 2018-05-14 LAB — GLUCOSE, CAPILLARY
GLUCOSE-CAPILLARY: 93 mg/dL (ref 70–99)
Glucose-Capillary: 142 mg/dL — ABNORMAL HIGH (ref 70–99)
Glucose-Capillary: 119 mg/dL — ABNORMAL HIGH (ref 70–99)
Glucose-Capillary: 112 mg/dL — ABNORMAL HIGH (ref 70–99)

## 2018-05-14 MED ORDER — FUROSEMIDE 20 MG PO TABS: 20.0000 mg | ORAL_TABLET | Freq: Every day | ORAL | Status: DC

## 2018-05-14 MED ORDER — FUROSEMIDE 10 MG/ML IJ SOLN: 20.0000 mg | Freq: Every day | INTRAMUSCULAR | Status: DC

## 2018-05-14 MED ORDER — TRACE MINERALS CR-CU-MN-SE-ZN 10-1000-500-60 MCG/ML IV SOLN
INTRAVENOUS | Status: AC
  Administered 2018-05-14: 18:00:00 via INTRAVENOUS
  Filled 2018-05-14: qty 1560

## 2018-05-14 MED ORDER — FAT EMULSION PLANT BASED 20 % IV EMUL
250.0000 mL | INTRAVENOUS | Status: AC
  Administered 2018-05-14: 250 mL via INTRAVENOUS
  Filled 2018-05-14: qty 250

## 2018-05-14 MED ORDER — PANTOPRAZOLE SODIUM 40 MG PO TBEC
40.0000 mg | DELAYED_RELEASE_TABLET | Freq: Every day | ORAL | Status: DC
  Administered 2018-05-14 – 2018-05-17 (×3): 40 mg via ORAL
  Filled 2018-05-14 (×4): qty 1

## 2018-05-14 MED ORDER — FUROSEMIDE 10 MG/ML IJ SOLN
20.0000 mg | Freq: Once | INTRAMUSCULAR | Status: AC
  Administered 2018-05-14: 20 mg via INTRAVENOUS
  Filled 2018-05-14: qty 2

## 2018-05-14 NOTE — Progress Notes (Signed)
[place] at [place] NAME: Danielle [name]    MR#:  [id number]  DATE OF BIRTH:  1956/04/04  post extubation 05/08/18.  Responding to verbal commands.  Family at bedside.  Appears comfortable. Pain tolerable Hemodynamically stable.  CHIEF COMPLAINT:   Chief Complaint  Patient presents with  . Shortness of Breath   Patient is feeling nauseous.  Denies any vomiting. could not tolerate Ensure  Wean off Cardizem drip for atrial fibrillation  REVIEW OF SYSTEMS:  Review of Systems  Constitutional: Negative forchillsand fever.  HENT: Negative forhearing loss.  Eyes: Negative forblurred vision,double visionand photophobia.  Respiratory: Denies any cough or shortness of breath negative forhemoptysis.  Cardiovascular: Negative forpalpitations,orthopneaand leg swelling.  Gastrointestinal: Negative forabdominal pain,diarrheaand vomiting.  Genitourinary: Negative fordysuriaand urgency.  Musculoskeletal: Negative formyalgiasand neck pain.  Skin: Negative forrash.  Neurological: Negative fordizziness,focal weakness,seizures,weaknessand headaches.  Psychiatric/Behavioral: Negative formemory loss. The patientdoes not have insomnia.    DRUG ALLERGIES:  No Known Allergies VITALS:  Blood pressure (!) 152/81, pulse 94, temperature 97.8 F (36.6 C), temperature source Oral, resp. rate 20, height 5\' 6"  (1.676 m), weight 71.4 kg (157 lb 4.8 oz), SpO2 100 %. PHYSICAL EXAMINATION:  Physical Exam  Constitutional: She appears well-nourished. She is sedated and not intubated.  Critically ill.  HENT:  Head: Normocephalic.  Mouth/Throat: Oropharynx is clear and moist.  Eyes: Conjunctivae are normal. No scleral icterus.  Neck: No tracheal deviation present.  Cardiovascular: Normal heart sounds. Exam reveals no gallop.  No murmur heard. Pulmonary/Chest: Effort normal. No stridor. She is not intubated. No respiratory distress. She has no  wheezes. She has no rales.  Abdominal: There is no rebound.  Status post surgery,  patient has ileostomy.  Musculoskeletal: She exhibits edema. She exhibits no tenderness.  Bilateral leg edema  Neurological: No cranial nerve deficit.  Skin: No rash noted. No erythema.   LABORATORY PANEL:  Female CBC Recent Labs  Lab 05/12/18 1012  WBC 10.0  HGB 8.2*  HCT 24.3*  PLT 274   ------------------------------------------------------------------------------------------------------------------ Chemistries  Recent Labs  Lab 05/12/18 1012  NA 136  K 4.0  CL 100  CO2 29  GLUCOSE 178*  BUN 28*  CREATININE 0.71  CALCIUM 8.3*  MG 1.7  AST 58*  ALT 47*  ALKPHOS 70  BILITOT 1.3*   RADIOLOGY:  No results found. ASSESSMENT AND PLAN:   47F acute hypoxemic respiratory failure, acute COPD exacerbation, dehydration, hypokalemia, hypoglycemia, renal failure, hypocalcemia, hypoalbuminemia, transaminasemia/hyperbilirubinemia, BNP elevation, Troponin-I elevation, macrocytic anemia.  Acute encephalopathy-resolved WBC is trending down 15.8-12.8 -10 afebrile CT scan of the abdomen with contrast with ileus but no obstruction or abscess.  Has some free fluid  GERD PPI  Acute respiratory distress  COPD-mild exacerbation Improved magnesium sulfate IV was given once  continue nebulizer treatments if necessary we will add steroids Will resume Lasix  Moderate Malnutritionrelated to chronic illness. on TPN due to ischemic colitis.  Hypomagnesemia repleted and recheck 2.4  Ischemic colitis status post right-sided hemicolectomy and ileostomy.  Followed by surgery.  TPN, on soft diet, started 05/12/2018  Hemorrhagic shock';s status post ex lap with right hemicolectomy,  bleeding scan, EGD unremarkable.,  Hb  stable after multiple units of blood transfusion, stable around 8.4.  Watch closely.  Patient extubated,  Patient on Zosyn for intra-abdominal coverage, WBC down from 30-14.  she is  afebrile.   New onset A. fib with RVR. Cardizem IV as needed currently rate controlled  .Acute on chronic systolic  CHF LV EF: 35% - 40%.  noAnticoagulation due to GI bleed.  Resume Lasix ` .Acute renal failure due to ATN from hemorrhagic shock: resolved.  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 Rogue Bussing     high risk for cardiac arrest, prognosis poor,, Called pt daughter chiffon [phone] and updated over phone All the records are reviewed and case discussed with Care Management/Social Worker. CODE STATUS: Full Code  TOTAL TIME TAKING CARE OF THIS PATIENT:  28 min More than 50% of the time was spent in counseling/coordination of care: YES  POSSIBLE D/C IN ?  DAYS, DEPENDING ON CLINICAL CONDITION.   Nicholes Mango M.D on 05/14/2018 at 11:56 AM  Between 7am to 6pm - Pager - [phone]  After 6pm go to [email or web address] - [facility]

## 2018-05-14 NOTE — Progress Notes (Signed)
Patient ID: Danielle [name], female   DOB: [birth year], 62 y.o.   MRN: [id number]     [facility] Day(s): 23.   Post op day(s): 7 Days Post-Op.   Interval History: Patient seen and examined, no acute events or new complaints overnight. Patient reports having difficulty breathing. Refers does not want to eat.   Vital signs in last 24 hours: [min-max] current  Temp:  [97.8 F (36.6 C)-98.3 F (36.8 C)] 97.8 F (36.6 C) (07/31 0903) Pulse Rate:  [79-119] 94 (07/31 0903) Resp:  [18-20] 20 (07/31 0903) BP: (132-152)/(81-106) 152/81 (07/31 0903) SpO2:  [98 %-100 %] 100 % (07/31 0903) Weight:  [71.4 kg (157 lb 4.8 oz)-72.4 kg (159 lb 9.8 oz)] 71.4 kg (157 lb 4.8 oz) (07/31 0537)     Height: 5\' 6"  (167.6 cm) Weight: 71.4 kg (157 lb 4.8 oz) BMI (Calculated): 25.4    Physical Exam:  Constitutional: alert, cooperative and no distress  Respiratory: breathing non-labored at rest  Cardiovascular: regular rate and sinus rhythm  Gastrointestinal: soft, non-tender, and non-distended. Wound dry and clean. Ostomy pink and patent with enteric content.   Labs:  CBC Latest Ref Rng & Units 05/12/2018 05/11/2018 05/10/2018  WBC 3.6 - 11.0 K/uL 10.0 12.8(H) 15.8(H)  Hemoglobin 12.0 - 16.0 g/dL 8.2(L) 10.0(L) 11.1(L)  Hematocrit 35.0 - 47.0 % 24.3(L) 29.7(L) 33.0(L)  Platelets 150 - 440 K/uL 274 274 282   CMP Latest Ref Rng & Units 05/12/2018 05/11/2018 05/10/2018  Glucose 70 - 99 mg/dL 178(H) - 167(H)  BUN 8 - 23 mg/dL 28(H) - 26(H)  Creatinine 0.44 - 1.00 mg/dL 0.71 - 0.60  Sodium 135 - 145 mmol/L 136 - 136  Potassium 3.5 - 5.1 mmol/L 4.0 4.1 3.8  Chloride 98 - 111 mmol/L 100 - 101  CO2 22 - 32 mmol/L 29 - 27  Calcium 8.9 - 10.3 mg/dL 8.3(L) - 8.7(L)  Total Protein 6.5 - 8.1 g/dL 5.1(L) - -  Total Bilirubin 0.3 - 1.2 mg/dL 1.3(H) - -  Alkaline Phos 38 - 126 U/L 70 - -  AST 15 - 41 U/L 58(H) - -  ALT 0 - 44 U/L 47(H) - -   Imaging studies: No new pertinent imaging  studies   Assessment/Plan:  62 y.o.femalewithbleeding hepatic flexure ischemic colitis status post right hemicolectomy with end ileostomy and mucus fistula POD #7,complicated by pertinent comorbiditiesincluding afib on anticoagulation,HTN,severe COPDon home supplemental oxygen prn,Right lung cancer s/p radiation with known Right hilar mass, cardiac murmur, lymphedema, generalized anxiety disorder, major depression disorder, and history of Left breast cancer s/p mastectomy and chemoradiation. Patient still alert and active, receiving physical therapy. Patient not eating much. Refers does not want to eat. Ostomy working, no vomiting. Difficult to assess amount of caloric intake. Agree with dietician to try to decrease fluid. No sign of bleeding.   Arnold Long, MD

## 2018-05-14 NOTE — Consult Note (Signed)
PHARMACY - ADULT TOTAL PARENTERAL NUTRITION CONSULT NOTE   Pharmacy Consult for TPN Indication: w/out adequate nutrition for > 7 days  Patient Measurements: Height: 5\' 6"  (167.6 cm) Weight: 157 lb 4.8 oz (71.4 kg) IBW/kg (Calculated) : 59.3 TPN AdjBW (KG): 72.2 Body mass index is 25.39 kg/m.  Assessment:   GI:  Endo:  Insulin requirements in the past 24 hours: 5 units Lytes:  Sodium (mmol/L)  Date Value  05/12/2018 136  02/09/2015 132 (L)   Potassium (mmol/L)  Date Value  05/12/2018 4.0  02/09/2015 3.8   Magnesium (mg/dL)  Date Value  05/12/2018 1.7  12/07/2013 1.8   Phosphorus (mg/dL)  Date Value  05/12/2018 4.7 (H)   Calcium (mg/dL)  Date Value  05/12/2018 8.3 (L)   Calcium, Total (mg/dL)  Date Value  02/09/2015 9.1   Albumin (g/dL)  Date Value  05/12/2018 2.1 (L)  02/09/2015 4.3     TPN Access: ordered 7/19 TPN start date: Nutritional Goals (per RD recommendation on 7/19): [id number] Protein: 78 Fluid:1800  Goal TPN rate is 65 ml/hr  Current Nutrition:   Plan:  Continue Clinimix E 5/15 @ 36ml/hr; Continue lipids 55mL/hr x 12 hr Add multivit and trace elements Will change SSI to sensitive q 6 hr Daily weights, strict in and outs Monitor closely w/ CHF- potentially fluid overloaded Electrolytes WNL Will check K, Mg, Phos as indicated, at least biweekly TG, LFT, albumin, CBC weekly Follow up tomorrow AM Pt complaining of SOB, weight up a good deal- patient with CHF- recommend restarting pt on lasix and changing diltazem back to metoprolol  Melissa D Maccia, Pharm.D, BCPS Clinical Pharmacist  05/14/2018,11:19 AM

## 2018-05-14 NOTE — Plan of Care (Signed)

## 2018-05-14 NOTE — Progress Notes (Signed)
Nutrition Follow-up  DOCUMENTATION CODES:   Non-severe (moderate) malnutrition in context of chronic illness  INTERVENTION:   Continue Clinimix E 5/15 at 65 mL/hr.   Continue 20% ILE at 20 mL/hr over 12 hours  Goal regimen provides 1588 kcal, 78 grams of protein, 1560 mL fluid daily (+240 mL fluid from lipids).  Continue adult MVI and trace elements as daily TPN additives.  Continue daily weights.  NUTRITION DIAGNOSIS:   Moderate Malnutrition related to chronic illness(hx breast cancer, CHF, COPD) as evidenced by mild fat depletion, moderate fat depletion, mild muscle depletion, moderate muscle depletion.  Ongoing - addressing with TPN.  GOAL:   Patient will meet greater than or equal to 90% of their needs  Met with TPN.  MONITOR:   PO intake, Supplement acceptance, Labs, Weight trends, I & O's, TPN  ASSESSMENT:   62 year old female with PMHx of arthritis, HTN, COPD, anxiety, depression, CHF, breast cancer s/p left mastectomy and chemotherapy/XRT, lymphedema who is now admitted with severe hypoxic and hypercapnic respiratory failure initially requiring BiPAP, CHF exacerbation, radiation pneumonitis/fibrosis.   Met with pt in room today. Pt reports not feeling well today; reports she has been having shortness of breath and coughing overnight. Pt reports her abdominal pain is improving but she is still having some nausea and heartburn. Pt reports that her supplements make her sick and she vomited after drinking an Ensure. Pt reports that she has only been eating jello and some broth. Per chart, pt with ~25lb weight gain since admit; +7.4L on her I & O's. There is concern that pt may be fluid overloaded as she was initially on lasix with a BNP of >4500 on 7/19. Spoke with MD, will initiate lasix and protonix today. RD will continue to monitor weights. If volume overload does not improve with lasix; will decrease TPN rate.    Medications reviewed and include: haldol, insulin,  pepcid  Labs reviewed: K 4.0 wnl, P 4.7(H), Mg 1.7 wnl- 7/29 Hgb 8.2(L), Hct 24.3(L)- 7/29 cbgs- 106, 112, 104, 142, 93 x 24 hrs Triglycerides- 55- 7/29  Diet Order:   Diet Order           DIET SOFT Room service appropriate? Yes; Fluid consistency: Thin  Diet effective now        Diet - low sodium heart healthy        Diet - low sodium heart healthy        Diet - low sodium heart healthy         EDUCATION NEEDS:   Education needs have been addressed  Skin:  Skin Assessment: (incision abdomen )  Last BM:  7/27- TYPE 7- ostomy   Height:   Ht Readings from Last 1 Encounters:  05/04/18 _0  (1.676 m)    Weight:   Wt Readings from Last 1 Encounters:  05/14/18 157 lb 4.8 oz (71.4 kg)    Ideal Body Weight:  59.1 kg  BMI:  Body mass index is 25.39 kg/m.  Estimated Nutritional Needs:   Kcal:  1505-1805 (25-30 kcal/kg)  Protein:  78-90 grams (1.3-1.5 grams/kg)  Fluid:  1.5-1.8 L/day (25-30 mL/kcal)  [name] MS, RD, LDN Pager #- [phone] Office#- [phone] After Hours Pager: [phone]

## 2018-05-15 ENCOUNTER — Inpatient Hospital Stay: Payer: Medicaid Other

## 2018-05-15 LAB — COMPREHENSIVE METABOLIC PANEL
ALBUMIN: 2.3 g/dL — AB (ref 3.5–5.0)
ALK PHOS: 124 U/L (ref 38–126)
ALT: 75 U/L — AB (ref 0–44)
ANION GAP: 7 (ref 5–15)
AST: 83 U/L — ABNORMAL HIGH (ref 15–41)
BILIRUBIN TOTAL: 1.3 mg/dL — AB (ref 0.3–1.2)
BUN: 29 mg/dL — AB (ref 8–23)
CALCIUM: 8.5 mg/dL — AB (ref 8.9–10.3)
CO2: 28 mmol/L (ref 22–32)
Chloride: 98 mmol/L (ref 98–111)
Creatinine, Ser: 0.63 mg/dL (ref 0.44–1.00)
GFR calc Af Amer: >60 mL/min (ref >60)
GFR calc non Af Amer: >60 mL/min (ref >60)
GLUCOSE: 124 mg/dL — AB (ref 70–99)
Potassium: 4.6 mmol/L (ref 3.5–5.1)
Sodium: 133 mmol/L — ABNORMAL LOW (ref 135–145)
TOTAL PROTEIN: 5.7 g/dL — AB (ref 6.5–8.1)

## 2018-05-15 LAB — GLUCOSE, CAPILLARY
GLUCOSE-CAPILLARY: 110 mg/dL — AB (ref 70–99)
Glucose-Capillary: 120 mg/dL — ABNORMAL HIGH (ref 70–99)
Glucose-Capillary: 121 mg/dL — ABNORMAL HIGH (ref 70–99)
Glucose-Capillary: 156 mg/dL — ABNORMAL HIGH (ref 70–99)

## 2018-05-15 LAB — CBC WITH DIFFERENTIAL/PLATELET
Basophils Absolute: 0 10*3/uL (ref 0–0.1)
Basophils Relative: 0 %
Eosinophils Absolute: 0 10*3/uL (ref 0–0.7)
Eosinophils Relative: 0 %
HEMATOCRIT: 26.6 % — AB (ref 35.0–47.0)
Hemoglobin: 8.8 g/dL — ABNORMAL LOW (ref 12.0–16.0)
LYMPHS ABS: 0.5 10*3/uL — AB (ref 1.0–3.6)
LYMPHS PCT: 2 %
MCH: 31.2 pg (ref 26.0–34.0)
MCHC: 33.1 g/dL (ref 32.0–36.0)
MCV: 94.4 fL (ref 80.0–100.0)
MONO ABS: 0.9 10*3/uL (ref 0.2–0.9)
MONOS PCT: 4 %
NEUTROS ABS: 24.1 10*3/uL — AB (ref 1.4–6.5)
Neutrophils Relative %: 94 %
Platelets: 392 10*3/uL (ref 150–440)
RBC: 2.81 MIL/uL — ABNORMAL LOW (ref 3.80–5.20)
RDW: 16.4 % — AB (ref 11.5–14.5)
WBC: 25.5 10*3/uL — ABNORMAL HIGH (ref 3.6–11.0)

## 2018-05-15 LAB — MAGNESIUM: Magnesium: 1.5 mg/dL — ABNORMAL LOW (ref 1.7–2.4)

## 2018-05-15 LAB — PHOSPHORUS: Phosphorus: 5 mg/dL — ABNORMAL HIGH (ref 2.5–4.6)

## 2018-05-15 MED ORDER — TRACE MINERALS CR-CU-MN-SE-ZN 10-1000-500-60 MCG/ML IV SOLN
INTRAVENOUS | Status: AC
  Administered 2018-05-15: 20:00:00 via INTRAVENOUS
  Filled 2018-05-15: qty 1560

## 2018-05-15 MED ORDER — MAGNESIUM SULFATE 2 GM/50ML IV SOLN
2.0000 g | Freq: Once | INTRAVENOUS | Status: AC
Start: 2018-05-15 — End: 2018-05-15
  Administered 2018-05-15: 2 g via INTRAVENOUS
  Filled 2018-05-15: qty 50

## 2018-05-15 MED ORDER — FUROSEMIDE 10 MG/ML IJ SOLN
60.0000 mg | Freq: Two times a day (BID) | INTRAMUSCULAR | Status: DC
  Administered 2018-05-15 – 2018-05-17 (×4): 60 mg via INTRAVENOUS
  Filled 2018-05-15 (×4): qty 6

## 2018-05-15 MED ORDER — FUROSEMIDE 10 MG/ML IJ SOLN
60.0000 mg | Freq: Every day | INTRAMUSCULAR | Status: DC
  Administered 2018-05-15: 60 mg via INTRAVENOUS
  Filled 2018-05-15: qty 6

## 2018-05-15 MED ORDER — METHYLPREDNISOLONE SODIUM SUCC 125 MG IJ SOLR
60.0000 mg | INTRAMUSCULAR | Status: DC
  Administered 2018-05-15 – 2018-05-16 (×2): 60 mg via INTRAVENOUS
  Filled 2018-05-15 (×2): qty 2

## 2018-05-15 MED ORDER — CLINIMIX/DEXTROSE (5/15) 5 % IV SOLN
INTRAVENOUS | Status: DC
  Filled 2018-05-15: qty 1560

## 2018-05-15 MED ORDER — METOPROLOL TARTRATE 5 MG/5ML IV SOLN
5.0000 mg | Freq: Three times a day (TID) | INTRAVENOUS | Status: DC
  Administered 2018-05-15 – 2018-05-16 (×4): 5 mg via INTRAVENOUS
  Filled 2018-05-15 (×4): qty 5

## 2018-05-15 NOTE — Progress Notes (Signed)
PT Cancellation Note  Patient Details Name: Danielle [name] MRN: [id number] DOB: 1956/02/16   Cancelled Treatment:    Reason Eval/Treat Not Completed: Fatigue/lethargy limiting ability to participate  Chart reviewed.  Pt sleeping soundly and did not awaken to verbal or tactile stim.  Will continue as appropriate.   Chesley Noon 05/15/2018, 3:31 PM

## 2018-05-15 NOTE — Progress Notes (Signed)
Dr. Darvin Neighbours made aware patient is lethargic this morning, and she's refusing oral medication, food and drink. Pt shakes her head no and closes her lips. Lungs congested, no productive cough

## 2018-05-15 NOTE — Consult Note (Signed)
PHARMACY - ADULT TOTAL PARENTERAL NUTRITION CONSULT NOTE   Pharmacy Consult for TPN Indication: w/out adequate nutrition for > 7 days  Patient Measurements: Height: 5\' 6"  (167.6 cm) Weight: 159 lb 13.3 oz (72.5 kg) IBW/kg (Calculated) : 59.3 TPN AdjBW (KG): 72.2 Body mass index is 25.8 kg/m.  Assessment:   GI:  Endo:  Insulin requirements in the past 24 hours: 5 units Lytes:  Sodium (mmol/L)  Date Value  05/15/2018 133 (L)  02/09/2015 132 (L)   Potassium (mmol/L)  Date Value  05/15/2018 4.6  02/09/2015 3.8   Magnesium (mg/dL)  Date Value  05/15/2018 1.5 (L)  12/07/2013 1.8   Phosphorus (mg/dL)  Date Value  05/15/2018 5.0 (H)   Calcium (mg/dL)  Date Value  05/15/2018 8.5 (L)   Calcium, Total (mg/dL)  Date Value  02/09/2015 9.1   Albumin (g/dL)  Date Value  05/15/2018 2.3 (L)  02/09/2015 4.3     TPN Access: ordered 7/19 TPN start date: Nutritional Goals (per RD recommendation on 7/19): [id number] Protein: 78 Fluid:1800  Goal TPN rate is 65 ml/hr  Current Nutrition:   Plan:  Change to Clinimix w/out electrolytes 5/15 @ 73ml/hr due to high phos Will hold lipids for a few days since pt appears fluid overloaded Add multivit and trace elements Mag 2g IV once due to Mg of 1.5 Recheck Mg, BMP, phos in AM Sensitive SSI q 6 hr Daily weights, strict in and outs Monitor closely w/ CHF- potentially fluid overloaded Electrolytes WNL Will check K, Mg, Phos as indicated, at least biweekly TG, LFT, albumin, CBC weekly Follow up tomorrow AM Pt complaining of SOB, weight up a good deal- patient with CHF-  Lasix started yesterday with little output- dose increased today  Melissa D Maccia, Pharm.D, BCPS Clinical Pharmacist  05/15/2018,11:33 AM

## 2018-05-15 NOTE — Progress Notes (Signed)
Patient ID: Danielle [name], female   DOB: October 13, 1956, 62 y.o.   MRN: [id number]     [facility] Day(s): 24.   Post op day(s): 8 Days Post-Op.   Interval History: Patient seen and examined. Today less active but answer to simple question.    Vital signs in last 24 hours: [min-max] current  Temp:  [98.3 F (36.8 C)-98.7 F (37.1 C)] 98.4 F (36.9 C) (08/01 1701) Pulse Rate:  [72-89] 83 (08/01 1701) Resp:  [18-20] 20 (08/01 1701) BP: (121-150)/(63-97) 121/78 (08/01 1701) SpO2:  [90 %-99 %] 96 % (08/01 1701) Weight:  [72.5 kg (159 lb 13.3 oz)] 72.5 kg (159 lb 13.3 oz) (08/01 0414)     Height: 5\' 6"  (167.6 cm) Weight: 72.5 kg (159 lb 13.3 oz) BMI (Calculated): 25.81    Physical Exam:  Constitutional: alert, cooperative and no distress  Respiratory: breathing non-labored at rest  Cardiovascular: regular rate and sinus rhythm  Gastrointestinal: soft, non-tender, and non-distended. Ostomy pink and patent.   Labs:  CBC Latest Ref Rng & Units 05/15/2018 05/12/2018 05/11/2018  WBC 3.6 - 11.0 K/uL 25.5(H) 10.0 12.8(H)  Hemoglobin 12.0 - 16.0 g/dL 8.8(L) 8.2(L) 10.0(L)  Hematocrit 35.0 - 47.0 % 26.6(L) 24.3(L) 29.7(L)  Platelets 150 - 440 K/uL 392 274 274   CMP Latest Ref Rng & Units 05/15/2018 05/12/2018 05/11/2018  Glucose 70 - 99 mg/dL 124(H) 178(H) -  BUN 8 - 23 mg/dL 29(H) 28(H) -  Creatinine 0.44 - 1.00 mg/dL 0.63 0.71 -  Sodium 135 - 145 mmol/L 133(L) 136 -  Potassium 3.5 - 5.1 mmol/L 4.6 4.0 4.1  Chloride 98 - 111 mmol/L 98 100 -  CO2 22 - 32 mmol/L 28 29 -  Calcium 8.9 - 10.3 mg/dL 8.5(L) 8.3(L) -  Total Protein 6.5 - 8.1 g/dL 5.7(L) 5.1(L) -  Total Bilirubin 0.3 - 1.2 mg/dL 1.3(H) 1.3(H) -  Alkaline Phos 38 - 126 U/L 124 70 -  AST 15 - 41 U/L 83(H) 58(H) -  ALT 0 - 44 U/L 75(H) 47(H) -    Imaging studies: No new pertinent imaging studies   Assessment/Plan:  62 y.o.femalewithbleeding hepatic flexure ischemic colitis status post right hemicolectomy  with end ileostomy and mucus fistula POD #11,complicated by pertinent comorbiditiesincluding afib on anticoagulation,HTN,severe COPDon home supplemental oxygen prn,Right lung cancer s/p radiation with known Right hilar mass, cardiac murmur, lymphedema, generalized anxiety disorder, major depression disorder, and history of Left breast cancer s/p mastectomy and chemoradiation. Patient today less active. The ileostomy was found with adequate output this morning. There is no significant electrolyte disturbance. Significant increase in WBC count (due to solumedrol?), but no fever, no tachycardia. Stable hemoglobin. No sign of GI bleeding. Patient is not eating much due to medical conditions. At this moment there is no contraindication from surgical stand point to continue diet.      Arnold Long, MD

## 2018-05-15 NOTE — Progress Notes (Signed)
[facility] at [place] NAME: Danielle [name]    MR#:  [id number]  DATE OF BIRTH:  1956/09/07    CHIEF COMPLAINT:   Chief Complaint  Patient presents with  . Shortness of Breath   Drowzy. Not eating. On 4 L o2  REVIEW OF SYSTEMS:  Cannot obtain due to encephalopathy   DRUG ALLERGIES:  No Known Allergies VITALS:  Blood pressure (!) 144/91, pulse 89, temperature 98.5 F (36.9 C), temperature source Oral, resp. rate 18, height 5\' 6"  (1.676 m), weight 72.5 kg (159 lb 13.3 oz), SpO2 94 %. PHYSICAL EXAMINATION:  Physical Exam  Constitutional: She appears well-nourished. She is sedated and not intubated.  Critically ill.  HENT:  Head: Normocephalic.  Mouth/Throat: Oropharynx is clear and moist.  Eyes: Conjunctivae are normal. No scleral icterus.  Neck: No tracheal deviation present.  Cardiovascular: Normal heart sounds. Exam reveals no gallop.  No murmur heard. Pulmonary/Chest: No stridor. She is not intubated. No respiratory distress. She has wheezes. She has rales.  Abdominal: There is no rebound.  Status post surgery,  patient has ileostomy.  Musculoskeletal: She exhibits edema. She exhibits no tenderness.  Bilateral leg edema  Neurological: No cranial nerve deficit.  Skin: No rash noted. No erythema.   LABORATORY PANEL:  Female CBC Recent Labs  Lab 05/12/18 1012  WBC 10.0  HGB 8.2*  HCT 24.3*  PLT 274   ------------------------------------------------------------------------------------------------------------------ Chemistries  Recent Labs  Lab 05/15/18 0510  NA 133*  K 4.6  CL 98  CO2 28  GLUCOSE 124*  BUN 29*  CREATININE 0.63  CALCIUM 8.5*  MG 1.5*  AST 83*  ALT 75*  ALKPHOS 124  BILITOT 1.3*   RADIOLOGY:  No results found. ASSESSMENT AND PLAN:   77 F acute hypoxemic respiratory failure, acute COPD exacerbation, dehydration, hypokalemia, hypoglycemia, renal failure, hypocalcemia, hypoalbuminemia,  transaminasemia/hyperbilirubinemia, BNP elevation, Troponin-I elevation, macrocytic anemia.  *Acute hypoxic respiratory failure Due to COPD exacerbation and acute on chronic systolic CHF On 4 L oxygen.  Wean as tolerated. Deteriorating today. Check stat chest x-ray IV steroids, stat dose of IV Lasix will continue twice daily Patient needed ventilatory support earlier in the admission and now extubated.  * Acute encephalopathy-due to inpatient delirium likely Check CT scan of the head We will check ammonia, TSH  * Severe protein calorie malnutrition  on TPN  * Hypomagnesemia repleted and recheck 2.4  * Ischemic colitis status post right-sided hemicolectomy and ileostomy.  Followed by surgery.  TPN, on soft diet, started 05/12/2018  *Ischemic colitis with  hemorrhagic shock Status post exploratory laparotomy with right hemicolectomy after  bleeding scan, EGD unremarkable Hb  stable after multiple units of blood transfusion stable around 8.4.  Watch closely.  Patient extubatedPatient on Zosyn for intra-abdominal coverage, WBC down from 30-14.  she is afebrile.  * New onset A. fib with RVR.  On Cardizem  * Acute renal failure due to ATN from hemorrhagic shock Resolved.  *History of previous lung cancer, chronic respiratory failure, history of COPD, on 2 L of oxygen all the time.hisstory of right middle lobe malignancy treated with radiation -3.5 cm right hilar mass: needs PET scan op per Dr. Rogue Bussing  CODE STATUS: Full Code  TOTAL CC TIME TAKING CARE OF THIS PATIENT:  52 minutes  Neita Carp M.D on 05/15/2018 at 12:04 PM  Between 7am to 6pm - Pager - [phone]  After 6pm go to [email or web address] - [employer] [facility]  Hospitalists

## 2018-05-15 NOTE — Plan of Care (Signed)
  Problem: Clinical Measurements: Goal: Will remain free from infection Outcome: Progressing   Problem: Pain Managment: Goal: General experience of comfort will improve Outcome: Progressing   Problem: Safety: Goal: Ability to remain free from injury will improve Outcome: Progressing   

## 2018-05-16 LAB — GLUCOSE, CAPILLARY
GLUCOSE-CAPILLARY: 114 mg/dL — AB (ref 70–99)
GLUCOSE-CAPILLARY: 140 mg/dL — AB (ref 70–99)
GLUCOSE-CAPILLARY: 147 mg/dL — AB (ref 70–99)
GLUCOSE-CAPILLARY: 148 mg/dL — AB (ref 70–99)
Glucose-Capillary: 143 mg/dL — ABNORMAL HIGH (ref 70–99)
Glucose-Capillary: 123 mg/dL — ABNORMAL HIGH (ref 70–99)

## 2018-05-16 LAB — CBC WITH DIFFERENTIAL/PLATELET
Basophils Absolute: 0 10*3/uL (ref 0–0.1)
Basophils Relative: 0 %
Eosinophils Absolute: 0 10*3/uL (ref 0–0.7)
Eosinophils Relative: 0 %
HEMATOCRIT: 23.6 % — AB (ref 35.0–47.0)
HEMOGLOBIN: 8 g/dL — AB (ref 12.0–16.0)
LYMPHS ABS: 0.3 10*3/uL — AB (ref 1.0–3.6)
LYMPHS PCT: 2 %
MCH: 30.9 pg (ref 26.0–34.0)
MCHC: 33.9 g/dL (ref 32.0–36.0)
MCV: 91 fL (ref 80.0–100.0)
MONO ABS: 0.4 10*3/uL (ref 0.2–0.9)
MONOS PCT: 3 %
NEUTROS ABS: 12.4 10*3/uL — AB (ref 1.4–6.5)
NEUTROS PCT: 95 %
Platelets: 409 10*3/uL (ref 150–440)
RBC: 2.59 MIL/uL — ABNORMAL LOW (ref 3.80–5.20)
RDW: 16.4 % — AB (ref 11.5–14.5)
WBC: 13.1 10*3/uL — ABNORMAL HIGH (ref 3.6–11.0)

## 2018-05-16 LAB — COMPREHENSIVE METABOLIC PANEL
ALK PHOS: 99 U/L (ref 38–126)
ALT: 55 U/L — ABNORMAL HIGH (ref 0–44)
ANION GAP: 3 — AB (ref 5–15)
AST: 46 U/L — ABNORMAL HIGH (ref 15–41)
Albumin: 2.1 g/dL — ABNORMAL LOW (ref 3.5–5.0)
BILIRUBIN TOTAL: 1 mg/dL (ref 0.3–1.2)
BUN: 32 mg/dL — ABNORMAL HIGH (ref 8–23)
CALCIUM: 8.1 mg/dL — AB (ref 8.9–10.3)
CO2: 31 mmol/L (ref 22–32)
Chloride: 98 mmol/L (ref 98–111)
Creatinine, Ser: 0.74 mg/dL (ref 0.44–1.00)
GFR calc Af Amer: >60 mL/min (ref >60)
GLUCOSE: 152 mg/dL — AB (ref 70–99)
POTASSIUM: 3.6 mmol/L (ref 3.5–5.1)
Sodium: 132 mmol/L — ABNORMAL LOW (ref 135–145)
TOTAL PROTEIN: 5.6 g/dL — AB (ref 6.5–8.1)

## 2018-05-16 LAB — PHOSPHORUS: Phosphorus: 4 mg/dL (ref 2.5–4.6)

## 2018-05-16 LAB — MAGNESIUM: Magnesium: 1.5 mg/dL — ABNORMAL LOW (ref 1.7–2.4)

## 2018-05-16 MED ORDER — FAMOTIDINE 20 MG PO TABS
20.0000 mg | ORAL_TABLET | Freq: Two times a day (BID) | ORAL | Status: DC
  Administered 2018-05-16 – 2018-05-17 (×2): 20 mg via ORAL
  Filled 2018-05-16 (×2): qty 1

## 2018-05-16 MED ORDER — TRACE MINERALS CR-CU-MN-SE-ZN 10-1000-500-60 MCG/ML IV SOLN
INTRAVENOUS | Status: DC
  Administered 2018-05-16: 18:00:00 via INTRAVENOUS
  Filled 2018-05-16: qty 960

## 2018-05-16 MED ORDER — METOPROLOL TARTRATE 50 MG PO TABS
50.0000 mg | ORAL_TABLET | Freq: Two times a day (BID) | ORAL | Status: DC
Start: 2018-05-16 — End: 2018-05-17
  Administered 2018-05-16 – 2018-05-17 (×2): 50 mg via ORAL
  Filled 2018-05-16 (×2): qty 1

## 2018-05-16 MED ORDER — ALUM & MAG HYDROXIDE-SIMETH 200-200-20 MG/5ML PO SUSP
30.0000 mL | ORAL | Status: DC | PRN
  Administered 2018-05-16 – 2018-05-17 (×3): 30 mL via ORAL
  Filled 2018-05-16 (×3): qty 30

## 2018-05-16 MED ORDER — MAGNESIUM SULFATE 4 GM/100ML IV SOLN
4.0000 g | Freq: Once | INTRAVENOUS | Status: AC
  Administered 2018-05-16: 4 g via INTRAVENOUS
  Filled 2018-05-16: qty 100

## 2018-05-16 NOTE — Progress Notes (Signed)
[facility] at [place] NAME: Danielle [name]    MR#:  [id number]  DATE OF BIRTH:  Jan 16, 1956    CHIEF COMPLAINT:   Chief Complaint  Patient presents with  . Shortness of Breath   Awake and alert.  Finished 100% of her breakfast.  Abdominal pain is much improved.  On 2 L oxygen which is her baseline.  Family at bedside.  Afebrile.  REVIEW OF SYSTEMS:  Review of Systems  Constitutional: Fatigue HENT: Negative for sore throat.   Eyes: Negative for blurred vision, double vision and pain.  Respiratory: Negative for cough, hemoptysis, shortness of breath and wheezing.   Cardiovascular: Negative for chest pain, palpitations, orthopnea .  Bilateral leg swelling Gastrointestinal: Abdominal pain at surgical site Genitourinary: Negative for dysuria and hematuria.  Musculoskeletal: Negative for back pain and joint pain.  Skin: Negative for rash.  Neurological: Negative for sensory change, speech change, focal weakness and headaches.  Endo/Heme/Allergies: Does not bruise/bleed easily.  Psychiatric/Behavioral: Negative for depression. The patient is not nervous/anxious.      DRUG ALLERGIES:  No Known Allergies VITALS:  Blood pressure (!) 141/70, pulse 75, temperature 98.4 F (36.9 C), temperature source Oral, resp. rate 20, height 5\' 6"  (1.676 m), weight 72.1 kg (158 lb 14.4 oz), SpO2 98 %. PHYSICAL EXAMINATION:  Physical Exam  Constitutional: She appears well-nourished. She is sedated and not intubated.  HENT:  Head: Normocephalic.  Mouth/Throat: Oropharynx is clear and moist.  Eyes: Conjunctivae are normal. No scleral icterus.  Neck: No tracheal deviation present.  Cardiovascular: Normal heart sounds. Exam reveals no gallop.  No murmur heard. Pulmonary/Chest: No stridor. She is not intubated. No respiratory distress. She has wheezes. She has rales.  Abdominal: There is no rebound.  Status post surgery,  patient has ileostomy.  Musculoskeletal: She  exhibits edema. She exhibits no tenderness.  Bilateral leg edema  Neurological: No cranial nerve deficit.  Skin: No rash noted. No erythema.   LABORATORY PANEL:  Female CBC Recent Labs  Lab 05/16/18 0542  WBC 13.1*  HGB 8.0*  HCT 23.6*  PLT 409   ------------------------------------------------------------------------------------------------------------------ Chemistries  Recent Labs  Lab 05/16/18 0542  NA 132*  K 3.6  CL 98  CO2 31  GLUCOSE 152*  BUN 32*  CREATININE 0.74  CALCIUM 8.1*  MG 1.5*  AST 46*  ALT 55*  ALKPHOS 99  BILITOT 1.0   RADIOLOGY:  Dg Chest 2 View  Result Date: 05/15/2018 CLINICAL DATA:  62 year old female with respiratory failure. EXAM: CHEST - 2 VIEW COMPARISON:  05/07/2018 and earlier. CT Abdomen and Pelvis 05/11/2018 FINDINGS: Seated AP and lateral views of the chest. Extubated and enteric tube removed. Right PICC line remains in place. Small bilateral pleural effusions persist but likely have decreased since 05/11/2018. Stable cardiomegaly and mediastinal contours. Calcified aortic atherosclerosis. Visualized tracheal air column is within normal limits. No pneumothorax. Stable pulmonary vascularity, no acute edema. Chronic right perihilar lung scarring. Osteopenia. No acute osseous abnormality identified. Mild gaseous distension of bowel in the visible abdomen similar to the recent CT. IMPRESSION: 1. Small bilateral pleural effusions, probably regressed since 05/11/2018. 2. No other acute cardiopulmonary abnormality. 3. Visible bowel gas pattern appears similar to that on the recent CT Abdomen and Pelvis. Electronically Signed   By: Genevie Ann M.D.   On: 05/15/2018 13:52   Ct Head Wo Contrast  Result Date: 05/15/2018 CLINICAL DATA:  Altered level of consciousness, encephalopathy. EXAM: CT HEAD WITHOUT CONTRAST TECHNIQUE: Contiguous axial  images were obtained from the base of the skull through the vertex without intravenous contrast. COMPARISON:  CT scan of  January 27, 2012. FINDINGS: Brain: Mild diffuse cortical atrophy is noted. Mild chronic ischemic white matter disease is noted. No mass effect or midline shift is noted. Ventricular size is within normal limits. There is no evidence of mass lesion, hemorrhage or acute infarction. Vascular: No hyperdense vessel or unexpected calcification. Skull: Normal. Negative for fracture or focal lesion. Sinuses/Orbits: No acute finding. Other: None. IMPRESSION: Mild diffuse cortical atrophy. Mild chronic ischemic white matter disease. No acute intracranial abnormality seen. Electronically Signed   By: Marijo Conception, M.D.   On: 05/15/2018 13:43   ASSESSMENT AND PLAN:   68 F acute hypoxemic respiratory failure, acute COPD exacerbation, dehydration, hypokalemia, hypoglycemia, renal failure, hypocalcemia, hypoalbuminemia, transaminasemia/hyperbilirubinemia, BNP elevation, Troponin-I elevation, macrocytic anemia.  *Acute hypoxic respiratory failure Due to COPD exacerbation and acute on chronic systolic CHF Respiratory status has improved today.  Started on IV steroids and IV Lasix on 05/15/2018.  Continue.  Diuresing well.  Still has significant anasarca.  * Acute encephalopathy-due to inpatient delirium likely CT scan of the head showed nothing acute Improved  * Severe protein calorie malnutrition  on TPN  Patient 800% of her meal today.  TPN being reduced.  Discussed with dietitian.  * Ischemic colitis status post right-sided hemicolectomy and ileostomy.  Followed by surgery.  TPN, on soft diet, started 05/12/2018  * New onset A. fib with RVR On Cardizem  * Acute renal failure due to ATN from hemorrhagic shock Resolved.  *History of previous lung cancer, chronic respiratory failure, history of COPD, on 2 L of oxygen all the time.hisstory of right middle lobe malignancy treated with radiation -3.5 cm right hilar mass: needs PET scan op per Dr. Rogue Bussing  CODE STATUS: Full Code  TOTAL CC TIME TAKING  CARE OF THIS PATIENT:  30 minutes  Likely discharge in 1 to 2 days once patient off TPN.  Leia Alf Zarriah Starkel M.D on 05/16/2018 at 12:23 PM  Between 7am to 6pm - Pager - [phone]  After 6pm go to [email or web address] - [employer] Hospitalists

## 2018-05-16 NOTE — Consult Note (Signed)
PHARMACY - ADULT TOTAL PARENTERAL NUTRITION CONSULT NOTE   Pharmacy Consult for TPN Indication: w/out adequate nutrition for > 7 days  Patient Measurements: Height: 5\' 6"  (167.6 cm) Weight: 158 lb 14.4 oz (72.1 kg) IBW/kg (Calculated) : 59.3 TPN AdjBW (KG): 72.2 Body mass index is 25.65 kg/m.  Assessment:   GI:  Endo:  Insulin requirements in the past 24 hours: 5 units Lytes:  Sodium (mmol/L)  Date Value  05/16/2018 132 (L)  02/09/2015 132 (L)   Potassium (mmol/L)  Date Value  05/16/2018 3.6  02/09/2015 3.8   Magnesium (mg/dL)  Date Value  05/16/2018 1.5 (L)  12/07/2013 1.8   Phosphorus (mg/dL)  Date Value  05/16/2018 4.0   Calcium (mg/dL)  Date Value  05/16/2018 8.1 (L)   Calcium, Total (mg/dL)  Date Value  02/09/2015 9.1   Albumin (g/dL)  Date Value  05/16/2018 2.1 (L)  02/09/2015 4.3     TPN Access: ordered 7/19 TPN start date: Nutritional Goals (per RD recommendation on 7/19): [id number] Protein: 33 Fluid:1800  Goal TPN rate is 65 ml/hr  Current Nutrition:   Plan:  Decrease Clinimix w/out electrolytes 5/15 to 37ml/hr  D/c lipids Pt is feeling much better and ate 100% of breakfast Decrease TPN rate now, continue lower rate until tomorrow then d//c TPN if pt tolerating diet. Check BG a few hours after TPN stopped Add multivit and trace elements Mag 4g IV once due to Mg of 1.5 Recheck Mg, BMP, phos in AM Sensitive SSI q 6 hr Daily weights, strict in and outs Monitor closely w/ CHF- potentially fluid overloaded Electrolytes WNL Will check K, Mg, Phos as indicated, at least biweekly TG, LFT, albumin, CBC weekly Follow up tomorrow AM Pt put out >2L yesterday - feeling much better  Ramond Dial, Pharm.D, BCPS Clinical Pharmacist  05/16/2018,11:19 AM

## 2018-05-16 NOTE — Progress Notes (Signed)
Physical Therapy Treatment Patient Details Name: Danielle [name] MRN: [id number] DOB: 16-Dec-1955 Today's Date: 05/16/2018    History of Present Illness  Pt is a 62 y.o. female with a known history of HTN, COPD (2L [place] O2 PRN), known R hilar mass, Hx breast Ca p/w cough/SOB, acute on chronic hypoxemic respiratory failure/acute COPD exacerbation. Pt is currently diagnosed with acute hypoxemic respiratory failure after being brought to ED via EMS. Pts hospital stay has been complicated by Exploratory Laparotomy for Right hemicolectomy with end ileostomy and mucus fistula post op day #8. after surgey pt was moved to ICU and intubated due to complications. Cardiology is monitoring and pt reamins on cartizam drip.    PT Comments    Pt seen this afternoon and with much encouragement is agreeable to work with PT. Throughout session pt very self limiting and states that she is not able to do anything however completes easily once beginning. Pt requires several rest breaks. Pt HR elevates from 106 at rest to 130 with activity. O2 saturation monitored and WNL on 2 L via [place] throughout session. Pt mod I for bed mobility, min to mod assist for transfers and amb 3' with CGA. Pt left comfortably in chair at conclusion of session. Pt instructed in there-ex and tolerates well. Pt could benefit from continued skilled therapy at this time to improve deficits toward PLOF. PT will continue to work with pt at least 2x/week while admitted. D/c recommendations continue to be SNF.    Follow Up Recommendations  SNF     Equipment Recommendations  Rolling walker with 5" wheels;3in1 (PT)    Recommendations for Other Services       Precautions / Restrictions Precautions Precautions: Fall Restrictions Weight Bearing Restrictions: No    Mobility  Bed Mobility Overal bed mobility: Modified Independent Bed Mobility: Supine to Sit     Supine to sit: Modified independent (Device/Increase time)     General bed  mobility comments: Pt requires increased UE support and time for sequencing however performs supine to sit without physical assistance  Transfers Overall transfer level: Needs assistance Equipment used: Rolling walker (2 wheeled) Transfers: Sit to/from Stand Sit to Stand: Mod assist;Min assist         General transfer comment: Pt requires mod assist to initiate motion for sit to stand however assistance decreases throughout transfer  Ambulation/Gait Ambulation/Gait assistance: Min guard Gait Distance (Feet): 3 Feet Assistive device: Rolling walker (2 wheeled) Gait Pattern/deviations: Shuffle     General Gait Details: pt shuffles 3' to chair beside bed with CGA and a RW. Pt demonstrates safe use of RW and does not have any LOB during amb   Stairs             Wheelchair Mobility    Modified Rankin (Stroke Patients Only)       Balance                                            Cognition Arousal/Alertness: Awake/alert Behavior During Therapy: WFL for tasks assessed/performed Overall Cognitive Status: No family/caregiver present to determine baseline cognitive functioning(pt having hallucinations)                                 General Comments: Pt having hallucinations throughout session asking therapist who those people over in  the kitchen are.      Exercises Other Exercises Other Exercises: Pt instructed in ther-ex which she tolerates well. Pt self limiting stating that she can not perform motion requiring assistance to begin however is able to perform independently even after assistance stops. ankle pumps, resisted knee ext, heel slides, SLR, hip abd, and seated marching x10-15 reps each    General Comments        Pertinent Vitals/Pain Pain Assessment: No/denies pain    Home Living                      Prior Function            PT Goals (current goals can now be found in the care plan section) Acute Rehab PT  Goals Patient Stated Goal: return to PLOF PT Goal Formulation: With patient Time For Goal Achievement: 05/27/18 Potential to Achieve Goals: Fair Progress towards PT goals: Progressing toward goals    Frequency    Min 2X/week      PT Plan Current plan remains appropriate    Co-evaluation              AM-PAC PT "6 Clicks" Daily Activity  Outcome Measure  Difficulty turning over in bed (including adjusting bedclothes, sheets and blankets)?: A Little Difficulty moving from lying on back to sitting on the side of the bed? : A Little Difficulty sitting down on and standing up from a chair with arms (e.g., wheelchair, bedside commode, etc,.)?: Unable Help needed moving to and from a bed to chair (including a wheelchair)?: A Little Help needed walking in hospital room?: A Little Help needed climbing 3-5 steps with a railing? : A Lot 6 Click Score: 15    End of Session Equipment Utilized During Treatment: Gait belt;Oxygen Activity Tolerance: Patient tolerated treatment well Patient left: in chair;with call bell/phone within reach;with chair alarm set Nurse Communication: Mobility status PT Visit Diagnosis: Unsteadiness on feet (R26.81);Other abnormalities of gait and mobility (R26.89);Muscle weakness (generalized) (M62.81);Difficulty in walking, not elsewhere classified (R26.2)     Time: 9628-3662 PT Time Calculation (min) (ACUTE ONLY): 29 min  Charges:                        Danielle [name], SPT    Danielle [name] 05/16/2018, 3:42 PM

## 2018-05-16 NOTE — Progress Notes (Addendum)
Nutrition Follow-up  DOCUMENTATION CODES:   Non-severe (moderate) malnutrition in context of chronic illness  INTERVENTION:   Decrease Clinimix 5/15 without electrolytes to 40 mL/hr. Plan to discontinue 8/3 if pt tolerating diet.   Discontinue 20% ILE   Continue adult MVI and trace elements as daily TPN additives.  Continue daily weights  NUTRITION DIAGNOSIS:   Moderate Malnutrition related to chronic illness(hx breast cancer, CHF, COPD) as evidenced by mild fat depletion, moderate fat depletion, mild muscle depletion, moderate muscle depletion.  Ongoing - addressing with TPN.  GOAL:   Patient will meet greater than or equal to 90% of their needs  Met with TPN.  MONITOR:   PO intake, Supplement acceptance, Labs, Weight trends, I & O's, TPN  ASSESSMENT:   62 year old female with PMHx of arthritis, HTN, COPD, anxiety, depression, CHF, breast cancer s/p left mastectomy and chemotherapy/XRT, lymphedema who is now admitted with severe hypoxic and hypercapnic respiratory failure initially requiring BiPAP, CHF exacerbation, radiation pneumonitis/fibrosis.   Pt doing well this morning; is more talkative and asking for food. Pt ate 100% of her breakfast today which included breakfast potatoes and scrambled eggs. Lipids held overnight last night. Lasix dose increased. Pt with increased urine output today. Will begin to wean TPN and can plan to discontinue 8/3 if pt tolerating diet well. Pt also with increased ostomy output today.   Medications reviewed and include: haldol, insulin, pepcid, lasix  Labs reviewed: K 3.6 wnl, P 4.0 wnl, Mg 1.5(L) Hgb 8.0(L), Hct 23.6(L) cbgs- 148, 143 x 24 hrs Triglycerides- 55- 7/29  Diet Order:   Diet Order           DIET SOFT Room service appropriate? Yes; Fluid consistency: Thin  Diet effective now        Diet - low sodium heart healthy        Diet - low sodium heart healthy        Diet - low sodium heart healthy         EDUCATION  NEEDS:   Education needs have been addressed  Skin:  Skin Assessment: (incision abdomen )  Last BM:  8/1- type 7- ostomy  Height:   Ht Readings from Last 1 Encounters:  05/04/18 '5\' 6"'$  (1.676 m)    Weight:   Wt Readings from Last 1 Encounters:  05/16/18 158 lb 14.4 oz (72.1 kg)    Ideal Body Weight:  59.1 kg  BMI:  Body mass index is 25.65 kg/m.  Estimated Nutritional Needs:   Kcal:  1505-1805 (25-30 kcal/kg)  Protein:  78-90 grams (1.3-1.5 grams/kg)  Fluid:  1.5-1.8 L/day (25-30 mL/kcal)  [name] MS, RD, LDN Pager #- [phone] Office#- [phone] After Hours Pager: [phone]

## 2018-05-16 NOTE — Progress Notes (Signed)
Patient ID: ALANTRA POPOCA, female   DOB: May 27, 1956, 62 y.o.   MRN: [id number]     [place] Hospital Day(s): 25.   Post op day(s): 9 Days Post-Op.   Interval History: Patient seen and examined, no acute events or new complaints overnight. Today a complete different patient, very alert, talkative, talking in the phone ordering the breakfast (sausage), questioning about when she is going home.  Vital signs in last 24 hours: [min-max] current  Temp:  [98.3 F (36.8 C)-98.7 F (37.1 C)] 98.4 F (36.9 C) (08/02 0749) Pulse Rate:  [50-86] 75 (08/02 0749) Resp:  [16-20] 20 (08/02 0749) BP: (121-141)/(67-97) 141/70 (08/02 0749) SpO2:  [96 %-100 %] 98 % (08/02 0749) Weight:  [72.1 kg (158 lb 14.4 oz)] 72.1 kg (158 lb 14.4 oz) (08/02 0331)     Height: 5\' 6"  (167.6 cm) Weight: 72.1 kg (158 lb 14.4 oz) BMI (Calculated): 25.66   Physical Exam:  Constitutional: alert, cooperative and no distress, breathing as baseline with nasal canula.  Gastrointestinal: soft, non-tender, and non-distended. Wound dry and clean, ostomy pink and patent with adequate enteric output.   Labs:  CBC Latest Ref Rng & Units 05/16/2018 05/15/2018 05/12/2018  WBC 3.6 - 11.0 K/uL 13.1(H) 25.5(H) 10.0  Hemoglobin 12.0 - 16.0 g/dL 8.0(L) 8.8(L) 8.2(L)  Hematocrit 35.0 - 47.0 % 23.6(L) 26.6(L) 24.3(L)  Platelets 150 - 440 K/uL 409 392 274   CMP Latest Ref Rng & Units 05/16/2018 05/15/2018 05/12/2018  Glucose 70 - 99 mg/dL 152(H) 124(H) 178(H)  BUN 8 - 23 mg/dL 32(H) 29(H) 28(H)  Creatinine 0.44 - 1.00 mg/dL 0.74 0.63 0.71  Sodium 135 - 145 mmol/L 132(L) 133(L) 136  Potassium 3.5 - 5.1 mmol/L 3.6 4.6 4.0  Chloride 98 - 111 mmol/L 98 98 100  CO2 22 - 32 mmol/L 31 28 29   Calcium 8.9 - 10.3 mg/dL 8.1(L) 8.5(L) 8.3(L)  Total Protein 6.5 - 8.1 g/dL 5.6(L) 5.7(L) 5.1(L)  Total Bilirubin 0.3 - 1.2 mg/dL 1.0 1.3(H) 1.3(H)  Alkaline Phos 38 - 126 U/L 99 124 70  AST 15 - 41 U/L 46(H) 83(H) 58(H)  ALT 0 - 44 U/L 55(H)  75(H) 47(H)    Imaging studies: No new pertinent imaging studies  Assessment/Plan:  62 y.o.femalewithbleeding hepatic flexure ischemic colitis status post right hemicolectomy with end ileostomy and mucus fistula POD #12,complicated by pertinent comorbiditiesincluding afib on anticoagulation,HTN,severe COPDon home supplemental oxygen prn,Right lung cancer s/p radiation with known Right hilar mass, cardiac murmur, lymphedema, generalized anxiety disorder, major depression disorder, and history of Left breast cancer s/p mastectomy and chemoradiation. Patient today very active and talkative. First time I hear patient talking about food, ordering food and wanting to eat. Patient mad because she wants to go home. From surgical stand point, patient is tolerating diet, hopefully will eat good amount today to be able to decrease TPN. No sign of bleeding. Ostomy porting properly. WBC decrease. Yesterday's 25,000 may be due to steroids. Agree with physical therapy. Patient today moving legs in the bed with more strength. If she continue to improve like that and eats good amount and TPN is discontinued, may be discharged from surgical standpoint when medically stable.     Arnold Long, MD

## 2018-05-17 LAB — BASIC METABOLIC PANEL
ANION GAP: 7 (ref 5–15)
BUN: 34 mg/dL — ABNORMAL HIGH (ref 8–23)
CO2: 26 mmol/L (ref 22–32)
CREATININE: 0.69 mg/dL (ref 0.44–1.00)
Calcium: 8.2 mg/dL — ABNORMAL LOW (ref 8.9–10.3)
Chloride: 99 mmol/L (ref 98–111)
GFR calc non Af Amer: >60 mL/min (ref >60)
GLUCOSE: 132 mg/dL — AB (ref 70–99)
Potassium: 3.2 mmol/L — ABNORMAL LOW (ref 3.5–5.1)
Sodium: 132 mmol/L — ABNORMAL LOW (ref 135–145)

## 2018-05-17 LAB — GLUCOSE, CAPILLARY
GLUCOSE-CAPILLARY: 146 mg/dL — AB (ref 70–99)
Glucose-Capillary: 139 mg/dL — ABNORMAL HIGH (ref 70–99)

## 2018-05-17 LAB — MAGNESIUM: MAGNESIUM: 1.9 mg/dL (ref 1.7–2.4)

## 2018-05-17 LAB — PHOSPHORUS: Phosphorus: 3.5 mg/dL (ref 2.5–4.6)

## 2018-05-17 MED ORDER — POTASSIUM CHLORIDE 20 MEQ PO PACK
40.0000 meq | PACK | Freq: Once | ORAL | Status: AC
  Administered 2018-05-17: 40 meq via ORAL
  Filled 2018-05-17: qty 2

## 2018-05-17 MED ORDER — PREDNISONE 50 MG PO TABS: 50.0000 mg | ORAL_TABLET | Freq: Every day | ORAL | Status: DC

## 2018-05-17 MED ORDER — METOPROLOL TARTRATE 50 MG PO TABS: 150.0000 mg | ORAL_TABLET | Freq: Two times a day (BID) | ORAL | Status: DC

## 2018-05-17 MED ORDER — METOPROLOL TARTRATE 50 MG PO TABS
150.0000 mg | ORAL_TABLET | Freq: Two times a day (BID) | ORAL | Status: DC
  Administered 2018-05-17: 150 mg via ORAL
  Filled 2018-05-17 (×2): qty 3

## 2018-05-17 MED ORDER — FUROSEMIDE 40 MG PO TABS: 40.0000 mg | ORAL_TABLET | Freq: Two times a day (BID) | ORAL | 0 refills | Status: DC

## 2018-05-17 MED ORDER — HYDROCODONE-ACETAMINOPHEN 5-325 MG PO TABS: 1.0000 | ORAL_TABLET | Freq: Four times a day (QID) | ORAL | 0 refills | Status: DC | PRN

## 2018-05-17 MED ORDER — POTASSIUM CHLORIDE ER 10 MEQ PO TBCR
10.0000 meq | EXTENDED_RELEASE_TABLET | Freq: Every day | ORAL | 0 refills | Status: DC
Start: 2018-05-17 — End: 2018-06-05

## 2018-05-17 MED ORDER — APIXABAN 5 MG PO TABS: 5.0000 mg | ORAL_TABLET | Freq: Two times a day (BID) | ORAL | 0 refills | Status: DC

## 2018-05-17 MED ORDER — PREDNISONE 50 MG PO TABS: ORAL_TABLET | ORAL | 0 refills | Status: AC

## 2018-05-17 MED ORDER — METOPROLOL TARTRATE 25 MG PO TABS: 75.0000 mg | ORAL_TABLET | Freq: Two times a day (BID) | ORAL | Status: DC

## 2018-05-17 NOTE — Progress Notes (Signed)
Patient ID: CACI ORREN, female   DOB: 10-Oct-1956, 62 y.o.   MRN: [id number]     [place] Hospital Day(s): 26.   Post op day(s): 10 Days Post-Op.   Interval History: Patient seen and examined, no acute events or new complaints overnight. Patient reports feeling OK with her usual breathing pattern, denies nausea or vomiting.    Vital signs in last 24 hours: [min-max] current  Temp:  [97.7 F (36.5 C)-98 F (36.7 C)] 97.9 F (36.6 C) (08/03 0802) Pulse Rate:  [52-135] 135 (08/03 1030) Resp:  [18-20] 18 (08/03 1030) BP: (116-141)/(69-93) 116/71 (08/03 1030) SpO2:  [94 %-100 %] 100 % (08/03 1030) Weight:  [70.5 kg (155 lb 6.4 oz)] 70.5 kg (155 lb 6.4 oz) (08/03 0338)     Height: 5\' 6"  (167.6 cm) Weight: 70.5 kg (155 lb 6.4 oz) BMI (Calculated): 25.09   Physical Exam:  Constitutional: alert, cooperative and no distress  Gastrointestinal: soft, non-tender, and non-distended. Ostomy pink and patent with adequate stool output.   Labs:  CBC Latest Ref Rng & Units 05/16/2018 05/15/2018 05/12/2018  WBC 3.6 - 11.0 K/uL 13.1(H) 25.5(H) 10.0  Hemoglobin 12.0 - 16.0 g/dL 8.0(L) 8.8(L) 8.2(L)  Hematocrit 35.0 - 47.0 % 23.6(L) 26.6(L) 24.3(L)  Platelets 150 - 440 K/uL 409 392 274   CMP Latest Ref Rng & Units 05/17/2018 05/16/2018 05/15/2018  Glucose 70 - 99 mg/dL 132(H) 152(H) 124(H)  BUN 8 - 23 mg/dL 34(H) 32(H) 29(H)  Creatinine 0.44 - 1.00 mg/dL 0.69 0.74 0.63  Sodium 135 - 145 mmol/L 132(L) 132(L) 133(L)  Potassium 3.5 - 5.1 mmol/L 3.2(L) 3.6 4.6  Chloride 98 - 111 mmol/L 99 98 98  CO2 22 - 32 mmol/L 26 31 28   Calcium 8.9 - 10.3 mg/dL 8.2(L) 8.1(L) 8.5(L)  Total Protein 6.5 - 8.1 g/dL - 5.6(L) 5.7(L)  Total Bilirubin 0.3 - 1.2 mg/dL - 1.0 1.3(H)  Alkaline Phos 38 - 126 U/L - 99 124  AST 15 - 41 U/L - 46(H) 83(H)  ALT 0 - 44 U/L - 55(H) 75(H)    Imaging studies: No new pertinent imaging studies   Assessment/Plan:  62 y.o.femalewithbleeding hepatic flexure ischemic  colitis status post right hemicolectomy with end ileostomy and mucus fistula POD #13,complicated by pertinent comorbiditiesincluding afib on anticoagulation,HTN,severe COPDon home supplemental oxygen prn,Right lung cancer s/p radiation with known Right hilar mass, cardiac murmur, lymphedema, generalized anxiety disorder, major depression disorder, and history of Left breast cancer s/p mastectomy and chemoradiation. Patient again awake and alert. Getting stronger. Eating much better. Good ileostomy output. From surgical stand point patient has not had any recurrence of GI bleeding, tolerating solid food with adequate ostomy output. May be discharged with adequate ostomy care orientation by wound care service to the daughter and may benefit of home care wound care service if daughter has difficulty taking care the ostomy.   Arnold Long, MD

## 2018-05-17 NOTE — Progress Notes (Signed)
Patient HR is sustaining in the 130's to 160's. PRN IV metoprolol given at 1029. Dr. Benjie Karvonen paged awaiting call back.

## 2018-05-17 NOTE — Progress Notes (Signed)
Patient has orders for d/c, MD notified of HR in 110-120's. Per Dr. Benjie Karvonen okay to discharge patient. Daughter aware and will pick up patient at 1600.

## 2018-05-17 NOTE — Care Management Note (Signed)
Case Management Note  Patient Details  Name: LINET BRASH MRN: [id number] Date of Birth: 05-30-1956  Subjective/Objective:  Patient to be discharged per MD order. Patient has been worked up for [facility] home health [facility]. Referral was previously placed but confirmed with Brenton Grills that patient was indeed approved for charity. DME ordered for 3 in 1 and a rolling walker. Equipment was delivered to the room. Patient already on chronic oxygen via [facility]. Spoke with daughter [name] [phone] regarding situation as patient lives with her. She is agreeable with discharge plan which is as follows: RN and PT from [facility] to assist with home health needs and ostomy education. Utilize medication management for medications. Take home the provided DME from the hospital. Bring portable O2 for transport.  Merrily Pew Marilynn Ekstein RN BSN RNCM [phone]                      Action/Plan:   Expected Discharge Date:  05/17/18               Expected Discharge Plan:  [place]  In-House Referral:     Discharge planning Services  CM Consult, [facility], Medication Assistance  Post Acute Care Choice:  Durable Medical Equipment, Home Health Choice offered to:  Patient  DME Arranged:  Bedside commode, Walker rolling DME Agency:  [place] Arranged:  RN, PT [facility] Agency:  [place]  Status of Service:  Completed, signed off  If discussed at [place] of Stay Meetings, dates discussed:    Additional Comments:  Halea Lieb A Denson Niccoli, RN 05/17/2018, 1:32 PM

## 2018-05-17 NOTE — Progress Notes (Addendum)
Per Dr. Benjie Karvonen give  PO metoprolol 150 mg, Will continue to monitor.

## 2018-05-17 NOTE — Consult Note (Signed)
PHARMACY - ADULT TOTAL PARENTERAL NUTRITION CONSULT NOTE   Pharmacy Consult for TPN Indication: w/out adequate nutrition for > 7 days  Patient Measurements: Height: 5\' 6"  (167.6 cm) Weight: 155 lb 6.4 oz (70.5 kg) IBW/kg (Calculated) : 59.3 TPN AdjBW (KG): 72.2 Body mass index is 25.08 kg/m.  Assessment:   GI:  Endo:  Insulin requirements in the past 24 hours: 5 units [place]:  Sodium (mmol/L)  Date Value  05/17/2018 132 (L)  02/09/2015 132 (L)   Potassium (mmol/L)  Date Value  05/17/2018 3.2 (L)  02/09/2015 3.8   Magnesium (mg/dL)  Date Value  05/17/2018 1.9  12/07/2013 1.8   Phosphorus (mg/dL)  Date Value  05/17/2018 3.5   Calcium (mg/dL)  Date Value  05/17/2018 8.2 (L)   Calcium, Total (mg/dL)  Date Value  02/09/2015 9.1   Albumin (g/dL)  Date Value  05/16/2018 2.1 (L)  02/09/2015 4.3     TPN Access: ordered 7/19 TPN start date: Nutritional Goals (per RD recommendation on 7/19): FGHW:2993 Protein: 78 Fluid:1800  Goal TPN rate is 65 ml/hr  Current Nutrition:   Plan:  Will replace K with 91mEq x 1 dose.   Clinimix w/out electrolytes 5/15  discontinued this morning by provider.   Lipids D/Cd 8/3  Pt is feeling much better and eating almost 100% of meals.   Pernell Dupre, PharmD, BCPS Clinical Pharmacist 05/17/2018 10:22 AM

## 2018-05-17 NOTE — Progress Notes (Signed)
Patient's discharge instructions including medications and follow-up appointment was explained with the patient and daughter. Patient's daughter told me that the patient does not have insurance and was concern about home medication, case management was gone for the day when this information was given to me. Home health was set up for patient for discharge. Provided patient commode, walker and oxygen to take home. Patient's telemetry monitor and PICC line was discontinued. NT helped patient to transport.

## 2018-05-17 NOTE — Discharge Summary (Signed)
[facility] [place] NAME: Danielle [name]    MR#:  [id number]  DATE OF BIRTH:  08/05/1956  DATE OF ADMISSION:  04/21/2018 ADMITTING PHYSICIAN: Arta Silence, MD  DATE OF DISCHARGE: 05/17/2018  PRIMARY CARE PHYSICIAN: Elisabeth Cara, NP    ADMISSION DIAGNOSIS:  Hypokalemia [E87.6] COPD exacerbation ([place]) [J44.1] Acute kidney injury ([place]) [N17.9]  DISCHARGE DIAGNOSIS:  Active Problems:   Malignant neoplasm of upper lobe of right lung ([place])   Acute hypoxemic respiratory failure (HCC)   Malnutrition of moderate degree   Palliative care by specialist   DNR (do not resuscitate) discussion   Weakness generalized   COPD exacerbation ([place])   Generalized abdominal pain   Colitis   Ulceration of intestine   Abdominal pain, right upper quadrant   Diverticulosis of large intestine without diverticulitis   Abnormal CT scan, colon   Focal (segmental) acute (reversible) ischemia of large intestine ([place])   Blood in stool   SECONDARY DIAGNOSIS:   Past Medical History:  Diagnosis Date  . Anxiety   . Arthritis   . Cancer ([place]) left    breast cancer 2000, chemo tx's with total mastectomy and lymph nodes resected.   . Cancer of right lung ([place]) 02/21/2016   rad tx's.   . CHF (congestive heart failure) ([place])   . COPD (chronic obstructive pulmonary disease) ([place])   . Dependence on supplemental oxygen   . Depression   . Diabetes mellitus without complication ([place])   . Heart murmur   . Hypertension   . Lung nodule   . Lymphedema   . Shortness of breath dyspnea    with exertion  . Status post chemotherapy 2001   left breast cancer  . Status post radiation therapy 2001   left breast cancer    HOSPITAL COURSE:  62 F who presented to the emergency room with shortness of breath and COPD exacerbation  1.  Acute hypoxic respiratory failure Due to COPD exacerbation and acute on chronic systolic CHF with ejection fraction 35 to 40% Patient's  respiratory status is improved.  She was diuresed with IV Lasix.  She will be discharged on oral prednisone and Lasix.  2.  Acute encephalopathy: This is due to inpatient delirium.  Mental status has improved.   CT scan of the head showed no acute findings.   3.  Severe protein calorie malnutrition  She was on TPN.  She has been transitioned to soft diet.  4.  Ischemic colitis: She is postoperative day #13 status post right hemicolectomy and an ileostomy. She will outpatient follow-up with surgery. Elevation white blood cell count is due to steroids  5.  New onset A. fib with RVR Patient will be discharged on oral metoprolol  6.  Acute renal failure due to ATN from hemorrhagic shock This has resolved.  7. History of previous lung cancer, chronic respiratory failure, history of COPD, on 2 L of oxygen all the time.hisstory of right middle lobe malignancy treated with radiation -3.5 cm right hilar mass: needs PET scan op per Dr. Rogue Bussing     DISCHARGE CONDITIONS AND DIET:   Fair condition for discharge on soft diet  CONSULTS OBTAINED:  Treatment Team:  Arta Silence, MD Teodoro Spray, MD [name], MD Lloyd Huger, MD Herbert Pun, MD  DRUG ALLERGIES:  No Known Allergies  DISCHARGE MEDICATIONS:   Allergies as of 05/17/2018   No Known Allergies     Medication List  STOP taking these medications   celecoxib 200 MG capsule Commonly known as:  CELEBREX   losartan 100 MG tablet Commonly known as:  COZAAR   potassium chloride SA 20 MEQ tablet Commonly known as:  K-DUR,KLOR-CON     TAKE these medications   albuterol 108 (90 Base) MCG/ACT inhaler Commonly known as:  PROVENTIL HFA;VENTOLIN HFA Inhale 2 puffs into the lungs every 6 (six) hours as needed for wheezing or shortness of breath.   apixaban 5 MG Tabs tablet Commonly known as:  ELIQUIS Take 1 tablet (5 mg total) by mouth 2 (two) times daily.    budesonide-formoterol 160-4.5 MCG/ACT inhaler Commonly known as:  SYMBICORT Inhale 2 puffs into the lungs 2 (two) times daily. What changed:  when to take this   citalopram 40 MG tablet Commonly known as:  CELEXA Take 40 mg by mouth daily.   furosemide 40 MG tablet Commonly known as:  LASIX Take 1 tablet (40 mg total) by mouth 2 (two) times daily.   HYDROcodone-acetaminophen 5-325 MG tablet Commonly known as:  NORCO/VICODIN Take 1 tablet by mouth every 6 (six) hours as needed for moderate pain or severe pain.   metoprolol succinate 50 MG 24 hr tablet Commonly known as:  TOPROL XL Take 3 tablets (150 mg total) by mouth daily. Take with or immediately following a meal. What changed:    medication strength  how much to take  additional instructions   potassium chloride 10 MEQ tablet Commonly known as:  K-DUR Take 1 tablet (10 mEq total) by mouth daily.   predniSONE 50 MG tablet Commonly known as:  DELTASONE Take 1 tablet for 4 days and stop Start taking on:  05/18/2018            Durable Medical Equipment  (From admission, onward)        Start     Ordered   05/17/18 1033  For home use only DME Gilford Rile  [facility])  Once    Question:  Patient needs a walker to treat with the following condition  Answer:  Weakness   05/17/18 1033   05/17/18 1033  DME tub bench  Once     05/17/18 1033        Today   CHIEF COMPLAINT:   No acute events overnight Patient is eating and TPN has been weaned off  VITAL SIGNS:  Blood pressure 116/71, pulse (!) 135, temperature 97.9 F (36.6 C), temperature source Oral, resp. rate 18, height 5\' 6"  (1.676 m), weight 155 lb 6.4 oz (70.5 kg), SpO2 100 %.   REVIEW OF SYSTEMS:  Review of Systems  Constitutional: Positive for malaise/fatigue. Negative for chills and fever.  HENT: Negative.  Negative for ear discharge, ear pain, hearing loss, nosebleeds and sore throat.   Eyes: Negative.  Negative for blurred vision and pain.   Respiratory: Negative.  Negative for cough, hemoptysis, shortness of breath and wheezing.   Cardiovascular: Negative.  Negative for chest pain, palpitations and leg swelling.  Gastrointestinal: Negative.  Negative for abdominal pain, blood in stool, diarrhea, nausea and vomiting.  Genitourinary: Negative.  Negative for dysuria.  Musculoskeletal: Negative.  Negative for back pain.  Skin: Negative.   Neurological: Negative for dizziness, tremors, speech change, focal weakness, seizures and headaches.  Endo/Heme/Allergies: Negative.  Does not bruise/bleed easily.  Psychiatric/Behavioral: Negative.  Negative for depression, hallucinations and suicidal ideas.     PHYSICAL EXAMINATION:  GENERAL:  62 y.o.-year-old patient lying in the bed with no acute distress.  NECK:  Supple, no jugular venous distention. No thyroid enlargement, no tenderness.  LUNGS: Normal breath sounds bilaterally, no wheezing, rales,rhonchi  No use of accessory muscles of respiration.  CARDIOVASCULAR: S1, S2 normal. No murmurs, rubs, or gallops.  ABDOMEN: Soft, non-tender, non-distended. Bowel sounds present. No organomegaly or mass.  EXTREMITIES: 1+ pedal edema, no cyanosis, or clubbing.  PSYCHIATRIC: The patient is alert and oriented x 3.  SKIN: No obvious rash, lesion, or ulcer.   DATA REVIEW:   CBC Recent Labs  Lab 05/16/18 0542  WBC 13.1*  HGB 8.0*  HCT 23.6*  PLT 409    Chemistries  Recent Labs  Lab 05/16/18 0542 05/17/18 0418  NA 132* 132*  K 3.6 3.2*  CL 98 99  CO2 31 26  GLUCOSE 152* 132*  BUN 32* 34*  CREATININE 0.74 0.69  CALCIUM 8.1* 8.2*  MG 1.5* 1.9  AST 46*  --   ALT 55*  --   ALKPHOS 99  --   BILITOT 1.0  --     Cardiac Enzymes No results for input(s): TROPONINI in the last 168 hours.  Microbiology Results  @MICRORSLT48 @  RADIOLOGY:  Dg Chest 2 View  Result Date: 05/15/2018 CLINICAL DATA:  62 year old female with respiratory failure. EXAM: CHEST - 2 VIEW COMPARISON:   05/07/2018 and earlier. CT Abdomen and Pelvis 05/11/2018 FINDINGS: Seated AP and lateral views of the chest. Extubated and enteric tube removed. Right PICC line remains in place. Small bilateral pleural effusions persist but likely have decreased since 05/11/2018. Stable cardiomegaly and mediastinal contours. Calcified aortic atherosclerosis. Visualized tracheal air column is within normal limits. No pneumothorax. Stable pulmonary vascularity, no acute edema. Chronic right perihilar lung scarring. Osteopenia. No acute osseous abnormality identified. Mild gaseous distension of bowel in the visible abdomen similar to the recent CT. IMPRESSION: 1. Small bilateral pleural effusions, probably regressed since 05/11/2018. 2. No other acute cardiopulmonary abnormality. 3. Visible bowel gas pattern appears similar to that on the recent CT Abdomen and Pelvis. Electronically Signed   By: Genevie Ann M.D.   On: 05/15/2018 13:52   Ct Head Wo Contrast  Result Date: 05/15/2018 CLINICAL DATA:  Altered level of consciousness, encephalopathy. EXAM: CT HEAD WITHOUT CONTRAST TECHNIQUE: Contiguous axial images were obtained from the base of the skull through the vertex without intravenous contrast. COMPARISON:  CT scan of January 27, 2012. FINDINGS: Brain: Mild diffuse cortical atrophy is noted. Mild chronic ischemic white matter disease is noted. No mass effect or midline shift is noted. Ventricular size is within normal limits. There is no evidence of mass lesion, hemorrhage or acute infarction. Vascular: No hyperdense vessel or unexpected calcification. Skull: Normal. Negative for fracture or focal lesion. Sinuses/Orbits: No acute finding. Other: None. IMPRESSION: Mild diffuse cortical atrophy. Mild chronic ischemic white matter disease. No acute intracranial abnormality seen. Electronically Signed   By: Marijo Conception, M.D.   On: 05/15/2018 13:43      Allergies as of 05/17/2018   No Known Allergies     Medication List    STOP  taking these medications   celecoxib 200 MG capsule Commonly known as:  CELEBREX   losartan 100 MG tablet Commonly known as:  COZAAR   potassium chloride SA 20 MEQ tablet Commonly known as:  K-DUR,KLOR-CON     TAKE these medications   albuterol 108 (90 Base) MCG/ACT inhaler Commonly known as:  PROVENTIL HFA;VENTOLIN HFA Inhale 2 puffs into the lungs every 6 (six) hours as needed for wheezing or shortness of breath.  apixaban 5 MG Tabs tablet Commonly known as:  ELIQUIS Take 1 tablet (5 mg total) by mouth 2 (two) times daily.   budesonide-formoterol 160-4.5 MCG/ACT inhaler Commonly known as:  SYMBICORT Inhale 2 puffs into the lungs 2 (two) times daily. What changed:  when to take this   citalopram 40 MG tablet Commonly known as:  CELEXA Take 40 mg by mouth daily.   furosemide 40 MG tablet Commonly known as:  LASIX Take 1 tablet (40 mg total) by mouth 2 (two) times daily.   HYDROcodone-acetaminophen 5-325 MG tablet Commonly known as:  NORCO/VICODIN Take 1 tablet by mouth every 6 (six) hours as needed for moderate pain or severe pain.   metoprolol succinate 50 MG 24 hr tablet Commonly known as:  TOPROL XL Take 3 tablets (150 mg total) by mouth daily. Take with or immediately following a meal. What changed:    medication strength  how much to take  additional instructions   potassium chloride 10 MEQ tablet Commonly known as:  K-DUR Take 1 tablet (10 mEq total) by mouth daily.   predniSONE 50 MG tablet Commonly known as:  DELTASONE Take 1 tablet for 4 days and stop Start taking on:  05/18/2018            Durable Medical Equipment  (From admission, onward)        Start     Ordered   05/17/18 1033  For home use only DME Gilford Rile  [facility])  Once    Question:  Patient needs a walker to treat with the following condition  Answer:  Weakness   05/17/18 1033   05/17/18 1033  DME tub bench  Once     05/17/18 1033         Management plans discussed with  the patient and she is in agreement. Stable for discharge   Patient should follow up with surgery  CODE STATUS:     Code Status Orders  (From admission, onward)        Start     Ordered   04/21/18 0634  Full code  Continuous     04/21/18 0633    Code Status History    Date Active Date Inactive Code Status Order ID Comments User Context   02/09/2017 1125 02/11/2017 1706 Full Code [id number]  Henreitta Leber, MD Inpatient      TOTAL TIME TAKING CARE OF THIS PATIENT: 38 minutes.    Note: This dictation was prepared with Dragon dictation along with smaller phrase technology. Any transcriptional errors that result from this process are unintentional.  Adelma Bowdoin M.D on 05/17/2018 at 10:35 AM  Between 7am to 6pm - Pager - [phone] After 6pm go to [email or web address] - password [email or web address] [facility]  Office  [phone]  CC: Primary care physician; Elisabeth Cara, NP

## 2018-05-20 ENCOUNTER — Emergency Department
Admission: EM | Admit: 2018-05-20 | Discharge: 2018-05-20 | Disposition: A | Discharge status: Home / Self Care | Payer: Medicaid Other | Attending: Emergency Medicine | Admitting: Emergency Medicine

## 2018-05-20 ENCOUNTER — Encounter: Payer: Self-pay | Admitting: Emergency Medicine

## 2018-05-20 DIAGNOSIS — F419 Anxiety disorder, unspecified: Secondary | ICD-10-CM | POA: Diagnosis not present

## 2018-05-20 DIAGNOSIS — E119 Type 2 diabetes mellitus without complications: Secondary | ICD-10-CM | POA: Diagnosis not present

## 2018-05-20 DIAGNOSIS — Z7901 Long term (current) use of anticoagulants: Secondary | ICD-10-CM | POA: Diagnosis not present

## 2018-05-20 DIAGNOSIS — F329 Major depressive disorder, single episode, unspecified: Secondary | ICD-10-CM | POA: Insufficient documentation

## 2018-05-20 DIAGNOSIS — J449 Chronic obstructive pulmonary disease, unspecified: Secondary | ICD-10-CM | POA: Diagnosis not present

## 2018-05-20 DIAGNOSIS — Z79899 Other long term (current) drug therapy: Secondary | ICD-10-CM | POA: Diagnosis not present

## 2018-05-20 DIAGNOSIS — Z853 Personal history of malignant neoplasm of breast: Secondary | ICD-10-CM | POA: Insufficient documentation

## 2018-05-20 DIAGNOSIS — K625 Hemorrhage of anus and rectum: Secondary | ICD-10-CM | POA: Diagnosis not present

## 2018-05-20 DIAGNOSIS — Z87891 Personal history of nicotine dependence: Secondary | ICD-10-CM | POA: Diagnosis not present

## 2018-05-20 LAB — URINALYSIS, COMPLETE (UACMP) WITH MICROSCOPIC
Bacteria, UA: NONE SEEN
Bilirubin Urine: NEGATIVE
Glucose, UA: NEGATIVE mg/dL
Ketones, ur: NEGATIVE mg/dL
Leukocytes, UA: NEGATIVE
Nitrite: NEGATIVE
PROTEIN: NEGATIVE mg/dL
Specific Gravity, Urine: 1.008 (ref 1.005–1.030)
pH: 6 (ref 5.0–8.0)

## 2018-05-20 LAB — CBC
HEMATOCRIT: 32.1 % — AB (ref 35.0–47.0)
HEMOGLOBIN: 10.5 g/dL — AB (ref 12.0–16.0)
MCH: 30.4 pg (ref 26.0–34.0)
MCHC: 32.7 g/dL (ref 32.0–36.0)
MCV: 93.1 fL (ref 80.0–100.0)
Platelets: 570 10*3/uL — ABNORMAL HIGH (ref 150–440)
RBC: 3.44 MIL/uL — AB (ref 3.80–5.20)
RDW: 16.9 % — ABNORMAL HIGH (ref 11.5–14.5)
WBC: 11.5 10*3/uL — ABNORMAL HIGH (ref 3.6–11.0)

## 2018-05-20 LAB — COMPREHENSIVE METABOLIC PANEL
ALK PHOS: 137 U/L — AB (ref 38–126)
ALT: 90 U/L — AB (ref 0–44)
AST: 60 U/L — ABNORMAL HIGH (ref 15–41)
Albumin: 3 g/dL — ABNORMAL LOW (ref 3.5–5.0)
Anion gap: 10 (ref 5–15)
BILIRUBIN TOTAL: 1.2 mg/dL (ref 0.3–1.2)
BUN: 24 mg/dL — AB (ref 8–23)
CHLORIDE: 105 mmol/L (ref 98–111)
CO2: 20 mmol/L — AB (ref 22–32)
Calcium: 8.8 mg/dL — ABNORMAL LOW (ref 8.9–10.3)
Creatinine, Ser: 0.85 mg/dL (ref 0.44–1.00)
GFR calc Af Amer: >60 mL/min (ref >60)
GFR calc non Af Amer: >60 mL/min (ref >60)
GLUCOSE: 102 mg/dL — AB (ref 70–99)
POTASSIUM: 3.9 mmol/L (ref 3.5–5.1)
SODIUM: 135 mmol/L (ref 135–145)
Total Protein: 6.9 g/dL (ref 6.5–8.1)

## 2018-05-20 NOTE — ED Notes (Signed)
Pt presents to ED via POV with her daughter. Per pt's daughter pt had colostomy placed to RLQ on 7/24. Today pt went to bathroom and when she wiped blood was noted and "there was blood in the commode" per patient's daughter. Pt's daughter denies any blood in colostomy, however reports leaking to colostomy bag, has not received any more bags from home health at this time. Pt's daughter states she is unsure if patient had blood from vaginal area or rectum. Pt is alert and oriented, answering questions appropriately at this time. Yellow, loose stool noted to patient's colostomy on assessment, pt denies any pain at this time.

## 2018-05-20 NOTE — ED Notes (Signed)
NAD noted at time of D/C. Pt denies questions or concerns. Pt taken to the lobby via wheelchair by this RN. Pt's daughter states understanding about follow up with Dr. Peyton Najjar.

## 2018-05-20 NOTE — ED Notes (Signed)
First Nurse Note: Patient assisted into WC from [place].  Released from this facility on Saturday.  Surgery on 05/04/18 per daughter.  Here this AM with blood in colostomy.

## 2018-05-20 NOTE — ED Notes (Signed)
This RN attempted for IV x 3 without success. Lav and Lt green collected, this RN spoke with MD, okay to hold on drawing type and screen until results from CBC.

## 2018-05-20 NOTE — ED Notes (Signed)
Surgeon at bedside at this time.

## 2018-05-20 NOTE — ED Notes (Signed)
EDP at bedside at this time.  

## 2018-05-20 NOTE — ED Provider Notes (Signed)
[facility] Emergency Department Provider Note   ____________________________________________    I have reviewed the triage vital signs and the nursing notes.   HISTORY  Chief Complaint Rectal Bleeding     HPI Danielle [name] is a 62 y.o. female with extensive past medical history as detailed below as well as recent prolonged hospitalization which began initially with shortness of breath and COPD exacerbation on BiPAP then requiring ICU stay with development of ischemic area on: Requiring surgery.  Recently discharged.  On Eliquis.  Presents today with complaints of blood clot noted when she wiped after urinating this morning.  Also notes dark bloody liquid in ostomy.  No lightheadedness.  No fevers  Past Medical History:  Diagnosis Date  . Anxiety   . Arthritis   . Cancer ([place]) left    breast cancer 2000, chemo tx's with total mastectomy and lymph nodes resected.   . Cancer of right lung ([place]) 02/21/2016   rad tx's.   . CHF (congestive heart failure) ([place])   . COPD (chronic obstructive pulmonary disease) ([place])   . Dependence on supplemental oxygen   . Depression   . Diabetes mellitus without complication ([place])   . Heart murmur   . Hypertension   . Lung nodule   . Lymphedema   . Shortness of breath dyspnea    with exertion  . Status post chemotherapy 2001   left breast cancer  . Status post radiation therapy 2001   left breast cancer    Patient Active Problem List   Diagnosis Date Noted  . Blood in stool   . Focal (segmental) acute (reversible) ischemia of large intestine ([place])   . Ulceration of intestine   . Abdominal pain, right upper quadrant   . Diverticulosis of large intestine without diverticulitis   . Abnormal CT scan, colon   . Generalized abdominal pain   . Colitis   . COPD exacerbation ([place])   . Malnutrition of moderate degree 04/23/2018  . Palliative care by specialist   . DNR (do not resuscitate) discussion   .  Weakness generalized   . Acute hypoxemic respiratory failure ([place]) 04/21/2018  . Hyponatremia 02/10/2017  . Syncope 02/09/2017  . Lung nodule, solitary 07/25/2016  . Malignant neoplasm of upper lobe of right lung ([place]) 07/25/2016  . Carcinoma of overlapping sites of left breast in female, estrogen receptor positive ([place]) 06/22/2016  . Multiple lung nodules 06/22/2016  . Cough   . Lesion of right lung   . Breast cancer in female [facility]) 02/21/2016  . DOE (dyspnea on exertion) 02/14/2016  . Moderate COPD (chronic obstructive pulmonary disease) ([place]) 08/24/2014    Past Surgical History:  Procedure Laterality Date  . Breast Biospy Left    [facility]  . BREAST SURGERY    . COLONOSCOPY N/A 04/30/2018   Procedure: COLONOSCOPY;  Surgeon: Virgel Manifold, MD;  Location: [facility] ENDOSCOPY;  Service: Endoscopy;  Laterality: N/A;  . DILATION AND CURETTAGE OF UTERUS    . ELECTROMAGNETIC NAVIGATION BROCHOSCOPY Right 04/11/2016   Procedure: ELECTROMAGNETIC NAVIGATION BRONCHOSCOPY;  Surgeon: Vilinda Boehringer, MD;  Location: [facility] ORS;  Service: Cardiopulmonary;  Laterality: Right;  . ESOPHAGOGASTRODUODENOSCOPY (EGD) WITH PROPOFOL N/A 05/07/2018   Procedure: ESOPHAGOGASTRODUODENOSCOPY (EGD) WITH PROPOFOL;  Surgeon: Lucilla Lame, MD;  Location: [facility] ENDOSCOPY;  Service: Endoscopy;  Laterality: N/A;  . history of colonoscopy]    . LAPAROTOMY Right 05/04/2018   Procedure: EXPLORATORY LAPAROTOMY right colectomy right and left ostomy;  Surgeon: Herbert Pun, MD;  Location: [facility] ORS;  Service: General;  Laterality: Right;  . LUNG BIOPSY    . MASTECTOMY Left    2000, [facility]  . ROTATOR CUFF REPAIR Right    [facility]    Prior to Admission medications   Medication Sig Start Date End Date Taking? Authorizing Provider  albuterol (PROVENTIL HFA;VENTOLIN HFA) 108 (90 Base) MCG/ACT inhaler Inhale 2 puffs into the lungs every 6 (six) hours as needed for wheezing or shortness of breath. 04/24/18   Demetrios Loll, MD  apixaban  (ELIQUIS) 5 MG TABS tablet Take 1 tablet (5 mg total) by mouth 2 (two) times daily. 05/17/18   Bettey Costa, MD  budesonide-formoterol (SYMBICORT) 160-4.5 MCG/ACT inhaler Inhale 2 puffs into the lungs 2 (two) times daily. 04/24/18   Demetrios Loll, MD  citalopram (CELEXA) 40 MG tablet Take 40 mg by mouth daily.     [provider]  furosemide (LASIX) 40 MG tablet Take 1 tablet (40 mg total) by mouth 2 (two) times daily. 05/17/18   Bettey Costa, MD  HYDROcodone-acetaminophen (NORCO/VICODIN) 5-325 MG tablet Take 1 tablet by mouth every 6 (six) hours as needed for moderate pain or severe pain. 05/17/18   Bettey Costa, MD  metoprolol succinate (TOPROL XL) 50 MG 24 hr tablet Take 3 tablets (150 mg total) by mouth daily. Take with or immediately following a meal. 04/26/18 04/26/19  Demetrios Loll, MD  potassium chloride (K-DUR) 10 MEQ tablet Take 1 tablet (10 mEq total) by mouth daily. 05/17/18   Bettey Costa, MD  predniSONE (DELTASONE) 50 MG tablet Take 1 tablet for 4 days and stop 05/18/18 05/21/18  Bettey Costa, MD     Allergies Patient has no known allergies.  Family History  Problem Relation Age of Onset  . Breast cancer Mother 39  . Cancer Mother        Breast   . Cirrhosis Father   . Breast cancer Paternal Aunt 59  . Cancer Maternal Aunt        Breast     Social History Social History   Tobacco Use  . Smoking status: Former Smoker    Packs/day: 0.50    Years: 20.00    Pack years: 10.00    Types: Cigarettes    Last attempt to quit: 07/02/2012    Years since quitting: 5.8  . Smokeless tobacco: Never Used  . Tobacco comment: quit 2014  Substance Use Topics  . Alcohol use: Yes    Comment: Occasionally  . Drug use: No    Review of Systems  Constitutional: No fever/chills Eyes: No visual changes.  ENT: No neck pain Cardiovascular: Denies chest pain. Respiratory: Denies shortness of breath. Gastrointestinal: No abdominal pain Genitourinary: Negative for dysuria. Musculoskeletal: Negative  for back pain. Skin: Negative for rash. Neurological: Negative for headaches    ____________________________________________   PHYSICAL EXAM:  VITAL SIGNS: ED Triage Vitals  Enc Vitals Group     BP 05/20/18 0736 (!) 155/112     Pulse Rate 05/20/18 0736 (!) 57     Resp 05/20/18 0736 17     Temp 05/20/18 0736 98.2 F (36.8 C)     Temp Source 05/20/18 0736 Oral     SpO2 05/20/18 0736 100 %     Weight 05/20/18 0737 68.9 kg (152 lb)     Height 05/20/18 0737 1.702 m (5\' 7" )     Head Circumference --      Peak Flow --      Pain Score 05/20/18 0739 0  Pain Loc --      Pain Edu? --      Excl. in [place]? --     Constitutional: Alert and oriented. No acute distress.    Mouth/Throat: Mucous membranes are moist.    Cardiovascular: Normal rate, regular rhythm.   Good peripheral circulation. Respiratory: Normal respiratory effort.  No retractions.  Gastrointestinal: Right lower quadrant loop ileostomy, yellowish stool.  Left lower quadrant mucous fistula bloody brown liquid, on gentle DRE, no obvious blood Genitourinary: deferred Musculoskeletal:   Warm and well perfused Neurologic:  Normal speech and language. No gross focal neurologic deficits are appreciated.  Skin:  Skin is warm, dry and intact. No rash noted. Psychiatric: Mood and affect are normal. Speech and behavior are normal.  ____________________________________________   LABS (all labs ordered are listed, but only abnormal results are displayed)  Labs Reviewed  CBC - Abnormal; Notable for the following components:      Result Value   WBC 11.5 (*)    RBC 3.44 (*)    Hemoglobin 10.5 (*)    HCT 32.1 (*)    RDW 16.9 (*)    Platelets 570 (*)    All other components within normal limits  COMPREHENSIVE METABOLIC PANEL - Abnormal; Notable for the following components:   CO2 20 (*)    Glucose, Bld 102 (*)    BUN 24 (*)    Calcium 8.8 (*)    Albumin 3.0 (*)    AST 60 (*)    ALT 90 (*)    Alkaline Phosphatase 137  (*)    All other components within normal limits  URINALYSIS, COMPLETE (UACMP) WITH MICROSCOPIC - Abnormal; Notable for the following components:   Color, Urine STRAW (*)    APPearance CLEAR (*)    Hgb urine dipstick MODERATE (*)    All other components within normal limits   ____________________________________________  EKG  None ____________________________________________  RADIOLOGY  None ____________________________________________   PROCEDURES  Procedure(s) performed: No  Procedures   Critical Care performed: No ____________________________________________   INITIAL IMPRESSION / ASSESSMENT AND PLAN / ED COURSE  Pertinent labs & imaging results that were available during my care of the patient were reviewed by me and considered in my medical decision making (see chart for details).  Patient presents with bleeding of unclear origin, family describes essentially a single blood clot.  This could be related vaginal or urinary or potentially rectal source although the patient has mucous fistula and diverting ileostomy.  We will check urinalysis, hemoglobin, have consulted surgery given potentially bloody fluid in mucous fistula  Seen by surgery Dr. Peyton Najjar, cleared for discharge with close outpatient follow-up    ____________________________________________   FINAL CLINICAL IMPRESSION(S) / ED DIAGNOSES  Final diagnoses:  Rectal bleeding        Note:  This document was prepared using Dragon voice recognition software and may include unintentional dictation errors.    Lavonia Drafts, MD 05/20/18 1038

## 2018-05-20 NOTE — ED Notes (Signed)
The First Nurse Note was completed by this RN not Vonna Kotyk.

## 2018-05-20 NOTE — ED Triage Notes (Signed)
Pt reports used the restroom this am and when she wiped there was blood. Pt with colostomy and was advised to come to the ED to be checked.

## 2018-05-21 ENCOUNTER — Inpatient Hospital Stay: Payer: Self-pay | Admitting: Internal Medicine

## 2018-05-22 ENCOUNTER — Ambulatory Visit: Payer: Self-pay | Admitting: Family

## 2018-05-26 ENCOUNTER — Telehealth: Payer: Self-pay | Admitting: *Deleted

## 2018-05-26 ENCOUNTER — Other Ambulatory Visit: Payer: Self-pay | Admitting: Internal Medicine

## 2018-05-26 ENCOUNTER — Inpatient Hospital Stay: Payer: Self-pay | Admitting: Internal Medicine

## 2018-05-26 ENCOUNTER — Other Ambulatory Visit: Payer: Self-pay | Admitting: *Deleted

## 2018-05-26 DIAGNOSIS — C3411 Malignant neoplasm of upper lobe, right bronchus or lung: Secondary | ICD-10-CM

## 2018-05-26 NOTE — Progress Notes (Signed)
Please inform pt that we would like to get PET scan

## 2018-05-26 NOTE — Telephone Encounter (Signed)
Contacted patient's daughter- returned phone call to discuss her mother's apts. Daughter states that patient is w/o [employer]process [employer]. Pending [place] medicaid. Concerned about financial difficulties.  Also concerned that apts have been r/s 3 times. Daughter was under the impression that pt had an apt today and found out that this apt was cnl. Pt does not remember cnl this apt today. There was a note on the chart that the apt was cnl and the "case worker would call back to r/s" daughter states that patient does not have a 'case worker.'  Per Dr. Rogue Bussing - order a pet scan prior to next md apt. Pt agreeable to this but is concerned about out of pocket cost. Requested to speak to social worker at cancer center. Apparently the [facility] Home care team case manager has already been helping the patient with out of pocket cost concerns for self pay and working this her on payments per daughter.  msg sent to Barnabas Lister to reach out to daughter to discuss needs further. Also msg sent to sch. To arrange for pet scan and contact daughter.

## 2018-05-27 NOTE — Telephone Encounter (Signed)
Danielle [name], please schedule pet scan and contact daughter with apts

## 2018-06-02 ENCOUNTER — Ambulatory Visit: Payer: Self-pay | Admitting: Family

## 2018-06-02 NOTE — Progress Notes (Deleted)
   Patient ID: Danielle [name], female    DOB: 1956-05-16, 62 y.o.   MRN: [id number]  HPI  Ms Mihalko is a 62 y/o female with a history of  Echo report from 04/21/18 reviewed and showed an EF of 35-40% along with mild AS/ trivial AR and moderate MR.   Was in the ED 05/20/18 due to rectal bleeding. Surgery saw patient and she was released. Admitted 04/21/18 due to COPD/HF exacerbation. Initially needed IV lasix and then transitioned to oral diuretics along with prednisone. Pulmonology, vascular, cardiology, surgery, GI, oncology and palliative care consults were all obtained. Head CT done due to acute encephalopathy which was negative. Needed TPN due to severe malnutrition and then advanced to a soft diet. Had ischemic colitis which required surgery (right hemicolectomy and an ileostomy). Discharged after 26 days.   She presents today for her initial visit with a chief complaint of   Review of Systems    Physical Exam    Assessment & Plan:  1: Chronic heart failure with reduced ejection fraction- - NYHA class - BNP 05/02/18 was >4500.0  2: HTN- - BMP 05/20/18 reviewed and showed sodium 135, potassium 3.9 and GFR >60  3: Atrial fibrillation- - saw cardiology Lyndel Pleasure) 10/23/17

## 2018-06-03 ENCOUNTER — Other Ambulatory Visit: Payer: Self-pay

## 2018-06-03 ENCOUNTER — Encounter: Payer: Self-pay | Admitting: Emergency Medicine

## 2018-06-03 ENCOUNTER — Inpatient Hospital Stay
Admission: EM | Admit: 2018-06-03 | Discharge: 2018-06-05 | DRG: 683 | Disposition: A | Discharge status: Home Health | Payer: Medicaid Other | Attending: Internal Medicine | Admitting: Internal Medicine

## 2018-06-03 DIAGNOSIS — I5022 Chronic systolic (congestive) heart failure: Secondary | ICD-10-CM | POA: Diagnosis present

## 2018-06-03 DIAGNOSIS — E119 Type 2 diabetes mellitus without complications: Secondary | ICD-10-CM | POA: Diagnosis present

## 2018-06-03 DIAGNOSIS — E43 Unspecified severe protein-calorie malnutrition: Secondary | ICD-10-CM

## 2018-06-03 DIAGNOSIS — Z9049 Acquired absence of other specified parts of digestive tract: Secondary | ICD-10-CM

## 2018-06-03 DIAGNOSIS — Z85118 Personal history of other malignant neoplasm of bronchus and lung: Secondary | ICD-10-CM

## 2018-06-03 DIAGNOSIS — I480 Paroxysmal atrial fibrillation: Secondary | ICD-10-CM | POA: Diagnosis present

## 2018-06-03 DIAGNOSIS — Z9012 Acquired absence of left breast and nipple: Secondary | ICD-10-CM

## 2018-06-03 DIAGNOSIS — M1990 Unspecified osteoarthritis, unspecified site: Secondary | ICD-10-CM | POA: Diagnosis present

## 2018-06-03 DIAGNOSIS — Z79899 Other long term (current) drug therapy: Secondary | ICD-10-CM

## 2018-06-03 DIAGNOSIS — Z87891 Personal history of nicotine dependence: Secondary | ICD-10-CM

## 2018-06-03 DIAGNOSIS — Z803 Family history of malignant neoplasm of breast: Secondary | ICD-10-CM

## 2018-06-03 DIAGNOSIS — J9610 Chronic respiratory failure, unspecified whether with hypoxia or hypercapnia: Secondary | ICD-10-CM | POA: Diagnosis present

## 2018-06-03 DIAGNOSIS — I110 Hypertensive heart disease with heart failure: Secondary | ICD-10-CM | POA: Diagnosis present

## 2018-06-03 DIAGNOSIS — Z9981 Dependence on supplemental oxygen: Secondary | ICD-10-CM

## 2018-06-03 DIAGNOSIS — Z9221 Personal history of antineoplastic chemotherapy: Secondary | ICD-10-CM

## 2018-06-03 DIAGNOSIS — Z853 Personal history of malignant neoplasm of breast: Secondary | ICD-10-CM

## 2018-06-03 DIAGNOSIS — J449 Chronic obstructive pulmonary disease, unspecified: Secondary | ICD-10-CM | POA: Diagnosis present

## 2018-06-03 DIAGNOSIS — Z7901 Long term (current) use of anticoagulants: Secondary | ICD-10-CM

## 2018-06-03 DIAGNOSIS — G8918 Other acute postprocedural pain: Secondary | ICD-10-CM

## 2018-06-03 DIAGNOSIS — T85848A Pain due to other internal prosthetic devices, implants and grafts, initial encounter: Secondary | ICD-10-CM | POA: Diagnosis present

## 2018-06-03 DIAGNOSIS — Y838 Other surgical procedures as the cause of abnormal reaction of the patient, or of later complication, without mention of misadventure at the time of the procedure: Secondary | ICD-10-CM | POA: Diagnosis present

## 2018-06-03 DIAGNOSIS — Z7951 Long term (current) use of inhaled steroids: Secondary | ICD-10-CM

## 2018-06-03 DIAGNOSIS — Z923 Personal history of irradiation: Secondary | ICD-10-CM

## 2018-06-03 DIAGNOSIS — N179 Acute kidney failure, unspecified: Principal | ICD-10-CM | POA: Diagnosis present

## 2018-06-03 LAB — BASIC METABOLIC PANEL
Anion gap: 5 (ref 5–15)
BUN: 40 mg/dL — AB (ref 8–23)
CO2: 14 mmol/L — ABNORMAL LOW (ref 22–32)
CREATININE: 2.01 mg/dL — AB (ref 0.44–1.00)
Calcium: 8.9 mg/dL (ref 8.9–10.3)
Chloride: 112 mmol/L — ABNORMAL HIGH (ref 98–111)
GFR calc Af Amer: 29 mL/min — ABNORMAL LOW (ref >60)
GFR calc non Af Amer: 25 mL/min — ABNORMAL LOW (ref >60)
Glucose, Bld: 117 mg/dL — ABNORMAL HIGH (ref 70–99)
POTASSIUM: 4.4 mmol/L (ref 3.5–5.1)
SODIUM: 131 mmol/L — AB (ref 135–145)

## 2018-06-03 LAB — CBC WITH DIFFERENTIAL/PLATELET
Basophils Absolute: 0.1 10*3/uL (ref 0–0.1)
Basophils Relative: 1 %
EOS ABS: 0.2 10*3/uL (ref 0–0.7)
EOS PCT: 2 %
HCT: 29.6 % — ABNORMAL LOW (ref 35.0–47.0)
Hemoglobin: 9.9 g/dL — ABNORMAL LOW (ref 12.0–16.0)
LYMPHS ABS: 2.5 10*3/uL (ref 1.0–3.6)
Lymphocytes Relative: 29 %
MCH: 30.7 pg (ref 26.0–34.0)
MCHC: 33.5 g/dL (ref 32.0–36.0)
MCV: 91.7 fL (ref 80.0–100.0)
Monocytes Absolute: 0.7 10*3/uL (ref 0.2–0.9)
Monocytes Relative: 8 %
Neutro Abs: 5.2 10*3/uL (ref 1.4–6.5)
Neutrophils Relative %: 60 %
Platelets: 296 10*3/uL (ref 150–440)
RBC: 3.23 MIL/uL — AB (ref 3.80–5.20)
RDW: 16.2 % — ABNORMAL HIGH (ref 11.5–14.5)
WBC: 8.7 10*3/uL (ref 3.6–11.0)

## 2018-06-03 MED ORDER — DICYCLOMINE HCL 10 MG/ML IM SOLN
20.0000 mg | Freq: Once | INTRAMUSCULAR | Status: AC
  Administered 2018-06-03: 20 mg via INTRAMUSCULAR
  Filled 2018-06-03: qty 2

## 2018-06-03 MED ORDER — SODIUM CHLORIDE 0.9 % IV BOLUS
1000.0000 mL | Freq: Once | INTRAVENOUS | Status: AC
  Administered 2018-06-03: 1000 mL via INTRAVENOUS

## 2018-06-03 NOTE — ED Triage Notes (Signed)
Pt to triage via w/c with no distress noted; pt reports had ileostomy 7/21; c/o pain to site since yesterday; denies any accop symptoms

## 2018-06-03 NOTE — ED Provider Notes (Signed)
[facility] Emergency Department Provider Note  __________________________________________   I have reviewed the triage vital signs and the nursing notes.   HISTORY  Chief Complaint Post-op Problem   History limited by: Not Limited   HPI Danielle [name] is a 62 y.o. female who presents to the emergency department today because of concerns for pain around the site of her recent ostomy.  Patient states the pain started yesterday.  It does somewhat come and go.  It will be sharp and then ease off. The patient also thinks that she noticed a small amount of blood coming out of the ostomy. The patient denies any fevers or shortness of breath.  She has had some weakness.  States that she had pain with her ostomy and was seen at [facility] ED a few weeks ago and treated for infection.  Per medical record review patient has a history of ostomy placement roughly 1 month ago.  Past Medical History:  Diagnosis Date  . Anxiety   . Arthritis   . Cancer ([place]) left    breast cancer 2000, chemo tx's with total mastectomy and lymph nodes resected.   . Cancer of right lung ([place]) 02/21/2016   rad tx's.   . CHF (congestive heart failure) ([place])   . COPD (chronic obstructive pulmonary disease) ([place])   . Dependence on supplemental oxygen   . Depression   . Diabetes mellitus without complication ([place])   . Heart murmur   . Hypertension   . Lung nodule   . Lymphedema   . Shortness of breath dyspnea    with exertion  . Status post chemotherapy 2001   left breast cancer  . Status post radiation therapy 2001   left breast cancer    Patient Active Problem List   Diagnosis Date Noted  . Blood in stool   . Focal (segmental) acute (reversible) ischemia of large intestine ([place])   . Ulceration of intestine   . Abdominal pain, right upper quadrant   . Diverticulosis of large intestine without diverticulitis   . Abnormal CT scan, colon   . Generalized abdominal pain   . Colitis   .  COPD exacerbation ([place])   . Malnutrition of moderate degree 04/23/2018  . Palliative care by specialist   . DNR (do not resuscitate) discussion   . Weakness generalized   . Acute hypoxemic respiratory failure ([place]) 04/21/2018  . Hyponatremia 02/10/2017  . Syncope 02/09/2017  . Lung nodule, solitary 07/25/2016  . Malignant neoplasm of upper lobe of right lung ([place]) 07/25/2016  . Carcinoma of overlapping sites of left breast in female, estrogen receptor positive ([place]) 06/22/2016  . Multiple lung nodules 06/22/2016  . Cough   . Lesion of right lung   . Breast cancer in female [facility]) 02/21/2016  . DOE (dyspnea on exertion) 02/14/2016  . Moderate COPD (chronic obstructive pulmonary disease) ([place]) 08/24/2014    Past Surgical History:  Procedure Laterality Date  . Breast Biospy Left    [facility]  . BREAST SURGERY    . COLONOSCOPY N/A 04/30/2018   Procedure: COLONOSCOPY;  Surgeon: Virgel Manifold, MD;  Location: [facility] ENDOSCOPY;  Service: Endoscopy;  Laterality: N/A;  . DILATION AND CURETTAGE OF UTERUS    . ELECTROMAGNETIC NAVIGATION BROCHOSCOPY Right 04/11/2016   Procedure: ELECTROMAGNETIC NAVIGATION BRONCHOSCOPY;  Surgeon: Vilinda Boehringer, MD;  Location: [facility] ORS;  Service: Cardiopulmonary;  Laterality: Right;  . ESOPHAGOGASTRODUODENOSCOPY (EGD) WITH PROPOFOL N/A 05/07/2018   Procedure: ESOPHAGOGASTRODUODENOSCOPY (EGD) WITH PROPOFOL;  Surgeon: Allen Norris,  Darren, MD;  Location: [facility] ENDOSCOPY;  Service: Endoscopy;  Laterality: N/A;  . history of colonoscopy]    . LAPAROTOMY Right 05/04/2018   Procedure: EXPLORATORY LAPAROTOMY right colectomy right and left ostomy;  Surgeon: Herbert Pun, MD;  Location: [facility] ORS;  Service: General;  Laterality: Right;  . LUNG BIOPSY    . MASTECTOMY Left    2000, [facility]  . ROTATOR CUFF REPAIR Right    [facility]    Prior to Admission medications   Medication Sig Start Date End Date Taking? Authorizing Provider  albuterol (PROVENTIL HFA;VENTOLIN HFA) 108 (90  Base) MCG/ACT inhaler Inhale 2 puffs into the lungs every 6 (six) hours as needed for wheezing or shortness of breath. 04/24/18   Demetrios Loll, MD  apixaban (ELIQUIS) 5 MG TABS tablet Take 1 tablet (5 mg total) by mouth 2 (two) times daily. 05/17/18   Bettey Costa, MD  budesonide-formoterol (SYMBICORT) 160-4.5 MCG/ACT inhaler Inhale 2 puffs into the lungs 2 (two) times daily. 04/24/18   Demetrios Loll, MD  citalopram (CELEXA) 40 MG tablet Take 40 mg by mouth daily.     [provider]  furosemide (LASIX) 40 MG tablet Take 1 tablet (40 mg total) by mouth 2 (two) times daily. 05/17/18   Bettey Costa, MD  HYDROcodone-acetaminophen (NORCO/VICODIN) 5-325 MG tablet Take 1 tablet by mouth every 6 (six) hours as needed for moderate pain or severe pain. 05/17/18   Bettey Costa, MD  metoprolol succinate (TOPROL XL) 50 MG 24 hr tablet Take 3 tablets (150 mg total) by mouth daily. Take with or immediately following a meal. 04/26/18 04/26/19  Demetrios Loll, MD  potassium chloride (K-DUR) 10 MEQ tablet Take 1 tablet (10 mEq total) by mouth daily. 05/17/18   Bettey Costa, MD    Allergies Patient has no known allergies.  Family History  Problem Relation Age of Onset  . Breast cancer Mother 56  . Cancer Mother        Breast   . Cirrhosis Father   . Breast cancer Paternal Aunt 79  . Cancer Maternal Aunt        Breast     Social History Social History   Tobacco Use  . Smoking status: Former Smoker    Packs/day: 0.50    Years: 20.00    Pack years: 10.00    Types: Cigarettes    Last attempt to quit: 07/02/2012    Years since quitting: 5.9  . Smokeless tobacco: Never Used  . Tobacco comment: quit 2014  Substance Use Topics  . Alcohol use: Yes    Comment: Occasionally  . Drug use: No    Review of Systems Constitutional: No fever/chills Eyes: No visual changes. ENT: No sore throat. Cardiovascular: Denies chest pain. Respiratory: Denies shortness of breath. Gastrointestinal: No abdominal pain.  No nausea, no  vomiting.  No diarrhea.   Genitourinary: Negative for dysuria. Musculoskeletal: Negative for back pain. Skin: Negative for rash. Neurological: Negative for headaches, focal weakness or numbness.  ____________________________________________   PHYSICAL EXAM:  VITAL SIGNS: ED Triage Vitals [06/03/18 2205]  Enc Vitals Group     BP      Pulse      Resp      Temp      Temp Source Oral     SpO2      Weight 151 lb 14.4 oz (68.9 kg)     Height 5\' 7"  (1.702 m)     Head Circumference      Peak Flow  Pain Score 8    Constitutional: Alert and oriented.  Eyes: Conjunctivae are normal.  ENT      Head: Normocephalic and atraumatic.      Nose: No congestion/rhinnorhea.      Mouth/Throat: Mucous membranes are moist.      Neck: No stridor. Hematological/Lymphatic/Immunilogical: No cervical lymphadenopathy. Cardiovascular: Normal rate, regular rhythm.  No murmurs, rubs, or gallops. Respiratory: Normal respiratory effort without tachypnea nor retractions. Breath sounds are clear and equal bilaterally. No wheezes/rales/rhonchi. Gastrointestinal: Soft and non tender. No rebound. No guarding.  Genitourinary: Deferred Musculoskeletal: Normal range of motion in all extremities. No lower extremity edema. Neurologic:  Normal speech and language. No gross focal neurologic deficits are appreciated.  Skin:  Skin is warm, dry and intact. No rash noted. Psychiatric: Mood and affect are normal. Speech and behavior are normal. Patient exhibits appropriate insight and judgment.  ____________________________________________    LABS (pertinent positives/negatives)  BMP na 131, k 4.4, glu 117, cr 2.01 CBC wbc 8.7, hgb 9.9, plt 296  ____________________________________________   EKG  None  ____________________________________________     RADIOLOGY  None  ____________________________________________   PROCEDURES  Procedures  ____________________________________________   INITIAL IMPRESSION / ASSESSMENT AND PLAN / ED COURSE  Pertinent labs & imaging results that were available during my care of the patient were reviewed by me and considered in my medical decision making (see chart for details).   Patient presented to the emergency department today because of some concerns for pain around the ostomy site.  On exam no obvious signs of infection.  Patient without leukocytosis.  This point unclear etiology of the patient's pain at the site of the ostomy however did discuss with Dr. Adora Fridge with surgery.  At this point he did not feel any emergent imaging is necessary.  Of note however patient's blood work does show acute kidney injury.  Creatinine 2.  Do wonder if this was the cause of patient's weakness.  Will start IV fluids and plan on admission.  Discussed findings plan with patient.  ____________________________________________   FINAL CLINICAL IMPRESSION(S) / ED DIAGNOSES  Final diagnoses:  AKI (acute kidney injury) ([place])  Post-operative pain     Note: This dictation was prepared with Dragon dictation. Any transcriptional errors that result from this process are unintentional     Nance Pear, MD 06/04/18 0004

## 2018-06-04 ENCOUNTER — Other Ambulatory Visit: Payer: Self-pay

## 2018-06-04 ENCOUNTER — Telehealth: Payer: Self-pay | Admitting: *Deleted

## 2018-06-04 DIAGNOSIS — N179 Acute kidney failure, unspecified: Secondary | ICD-10-CM | POA: Diagnosis present

## 2018-06-04 DIAGNOSIS — Z923 Personal history of irradiation: Secondary | ICD-10-CM | POA: Diagnosis not present

## 2018-06-04 DIAGNOSIS — Z9049 Acquired absence of other specified parts of digestive tract: Secondary | ICD-10-CM | POA: Diagnosis not present

## 2018-06-04 DIAGNOSIS — E119 Type 2 diabetes mellitus without complications: Secondary | ICD-10-CM | POA: Diagnosis present

## 2018-06-04 DIAGNOSIS — E43 Unspecified severe protein-calorie malnutrition: Secondary | ICD-10-CM

## 2018-06-04 DIAGNOSIS — Z85118 Personal history of other malignant neoplasm of bronchus and lung: Secondary | ICD-10-CM | POA: Diagnosis not present

## 2018-06-04 DIAGNOSIS — Z7951 Long term (current) use of inhaled steroids: Secondary | ICD-10-CM | POA: Diagnosis not present

## 2018-06-04 DIAGNOSIS — Z79899 Other long term (current) drug therapy: Secondary | ICD-10-CM | POA: Diagnosis not present

## 2018-06-04 DIAGNOSIS — Z9012 Acquired absence of left breast and nipple: Secondary | ICD-10-CM | POA: Diagnosis not present

## 2018-06-04 DIAGNOSIS — J449 Chronic obstructive pulmonary disease, unspecified: Secondary | ICD-10-CM | POA: Diagnosis present

## 2018-06-04 DIAGNOSIS — T85848A Pain due to other internal prosthetic devices, implants and grafts, initial encounter: Secondary | ICD-10-CM | POA: Diagnosis present

## 2018-06-04 DIAGNOSIS — M1990 Unspecified osteoarthritis, unspecified site: Secondary | ICD-10-CM | POA: Diagnosis present

## 2018-06-04 DIAGNOSIS — Z803 Family history of malignant neoplasm of breast: Secondary | ICD-10-CM | POA: Diagnosis not present

## 2018-06-04 DIAGNOSIS — I480 Paroxysmal atrial fibrillation: Secondary | ICD-10-CM | POA: Diagnosis present

## 2018-06-04 DIAGNOSIS — Z7901 Long term (current) use of anticoagulants: Secondary | ICD-10-CM | POA: Diagnosis not present

## 2018-06-04 DIAGNOSIS — Z87891 Personal history of nicotine dependence: Secondary | ICD-10-CM | POA: Diagnosis not present

## 2018-06-04 DIAGNOSIS — Z853 Personal history of malignant neoplasm of breast: Secondary | ICD-10-CM | POA: Diagnosis not present

## 2018-06-04 DIAGNOSIS — Y838 Other surgical procedures as the cause of abnormal reaction of the patient, or of later complication, without mention of misadventure at the time of the procedure: Secondary | ICD-10-CM | POA: Diagnosis present

## 2018-06-04 DIAGNOSIS — Z9981 Dependence on supplemental oxygen: Secondary | ICD-10-CM | POA: Diagnosis not present

## 2018-06-04 DIAGNOSIS — R531 Weakness: Secondary | ICD-10-CM | POA: Diagnosis present

## 2018-06-04 DIAGNOSIS — I5022 Chronic systolic (congestive) heart failure: Secondary | ICD-10-CM | POA: Diagnosis present

## 2018-06-04 DIAGNOSIS — Z9221 Personal history of antineoplastic chemotherapy: Secondary | ICD-10-CM | POA: Diagnosis not present

## 2018-06-04 DIAGNOSIS — I110 Hypertensive heart disease with heart failure: Secondary | ICD-10-CM | POA: Diagnosis present

## 2018-06-04 DIAGNOSIS — J9610 Chronic respiratory failure, unspecified whether with hypoxia or hypercapnia: Secondary | ICD-10-CM | POA: Diagnosis present

## 2018-06-04 LAB — BASIC METABOLIC PANEL
ANION GAP: 4 — AB (ref 5–15)
BUN: 35 mg/dL — ABNORMAL HIGH (ref 8–23)
CALCIUM: 8.5 mg/dL — AB (ref 8.9–10.3)
CO2: 13 mmol/L — ABNORMAL LOW (ref 22–32)
Chloride: 117 mmol/L — ABNORMAL HIGH (ref 98–111)
Creatinine, Ser: 1.53 mg/dL — ABNORMAL HIGH (ref 0.44–1.00)
GFR, EST AFRICAN AMERICAN: 40 mL/min — AB (ref >60)
GFR, EST NON AFRICAN AMERICAN: 35 mL/min — AB (ref >60)
Glucose, Bld: 92 mg/dL (ref 70–99)
Potassium: 4.5 mmol/L (ref 3.5–5.1)
SODIUM: 134 mmol/L — AB (ref 135–145)

## 2018-06-04 LAB — GLUCOSE, CAPILLARY: GLUCOSE-CAPILLARY: 79 mg/dL (ref 70–99)

## 2018-06-04 LAB — CBC
HCT: 29.7 % — ABNORMAL LOW (ref 35.0–47.0)
HEMOGLOBIN: 9.8 g/dL — AB (ref 12.0–16.0)
MCH: 30.7 pg (ref 26.0–34.0)
MCHC: 33.1 g/dL (ref 32.0–36.0)
MCV: 92.6 fL (ref 80.0–100.0)
PLATELETS: 276 10*3/uL (ref 150–440)
RBC: 3.21 MIL/uL — ABNORMAL LOW (ref 3.80–5.20)
RDW: 15.9 % — ABNORMAL HIGH (ref 11.5–14.5)
WBC: 7.9 10*3/uL (ref 3.6–11.0)

## 2018-06-04 LAB — MAGNESIUM: MAGNESIUM: 1.5 mg/dL — AB (ref 1.7–2.4)

## 2018-06-04 LAB — PHOSPHORUS: Phosphorus: 4.2 mg/dL (ref 2.5–4.6)

## 2018-06-04 MED ORDER — HYDROCODONE-ACETAMINOPHEN 5-325 MG PO TABS
1.0000 | ORAL_TABLET | ORAL | Status: DC | PRN
  Administered 2018-06-04 (×2): 2 via ORAL
  Administered 2018-06-05: 1 via ORAL
  Filled 2018-06-04: qty 1
  Filled 2018-06-04: qty 2

## 2018-06-04 MED ORDER — BISACODYL 5 MG PO TBEC: 5.0000 mg | DELAYED_RELEASE_TABLET | Freq: Every day | ORAL | Status: DC | PRN

## 2018-06-04 MED ORDER — MEGESTROL ACETATE 400 MG/10ML PO SUSP
300.0000 mg | Freq: Two times a day (BID) | ORAL | Status: DC
  Administered 2018-06-04 – 2018-06-05 (×3): 300 mg via ORAL
  Filled 2018-06-04 (×5): qty 10

## 2018-06-04 MED ORDER — METOPROLOL SUCCINATE ER 100 MG PO TB24
150.0000 mg | ORAL_TABLET | Freq: Every day | ORAL | Status: DC
  Administered 2018-06-05: 150 mg via ORAL
  Filled 2018-06-04 (×2): qty 1

## 2018-06-04 MED ORDER — ONDANSETRON HCL 4 MG PO TABS: 4.0000 mg | ORAL_TABLET | Freq: Four times a day (QID) | ORAL | Status: DC | PRN

## 2018-06-04 MED ORDER — ACETAMINOPHEN 650 MG RE SUPP: 650.0000 mg | Freq: Four times a day (QID) | RECTAL | Status: DC | PRN

## 2018-06-04 MED ORDER — ACETAMINOPHEN 325 MG PO TABS
650.0000 mg | ORAL_TABLET | Freq: Four times a day (QID) | ORAL | Status: DC | PRN
  Administered 2018-06-04: 650 mg via ORAL
  Filled 2018-06-04: qty 2

## 2018-06-04 MED ORDER — DOCUSATE SODIUM 100 MG PO CAPS
100.0000 mg | ORAL_CAPSULE | Freq: Two times a day (BID) | ORAL | Status: DC
  Administered 2018-06-04 (×2): 100 mg via ORAL
  Filled 2018-06-04 (×2): qty 1

## 2018-06-04 MED ORDER — NEPRO/CARBSTEADY PO LIQD
237.0000 mL | Freq: Two times a day (BID) | ORAL | Status: DC
  Administered 2018-06-04 – 2018-06-05 (×3): 237 mL via ORAL

## 2018-06-04 MED ORDER — TRAZODONE HCL 50 MG PO TABS: 25.0000 mg | ORAL_TABLET | Freq: Every evening | ORAL | Status: DC | PRN

## 2018-06-04 MED ORDER — FLUTICASONE FUROATE-VILANTEROL 200-25 MCG/INH IN AEPB
1.0000 | INHALATION_SPRAY | Freq: Every day | RESPIRATORY_TRACT | Status: DC
  Administered 2018-06-04 – 2018-06-05 (×2): 1 via RESPIRATORY_TRACT
  Filled 2018-06-04: qty 28

## 2018-06-04 MED ORDER — SODIUM CHLORIDE 0.9 % IV SOLN
INTRAVENOUS | Status: DC
  Administered 2018-06-04 – 2018-06-05 (×3): via INTRAVENOUS

## 2018-06-04 MED ORDER — HYDROCODONE-ACETAMINOPHEN 5-325 MG PO TABS
ORAL_TABLET | ORAL | Status: AC
  Filled 2018-06-04: qty 2

## 2018-06-04 MED ORDER — MAGNESIUM SULFATE 2 GM/50ML IV SOLN
2.0000 g | Freq: Once | INTRAVENOUS | Status: AC
  Administered 2018-06-04: 2 g via INTRAVENOUS
  Filled 2018-06-04: qty 50

## 2018-06-04 MED ORDER — ALBUTEROL SULFATE (2.5 MG/3ML) 0.083% IN NEBU: 3.0000 mL | INHALATION_SOLUTION | Freq: Four times a day (QID) | RESPIRATORY_TRACT | Status: DC | PRN

## 2018-06-04 MED ORDER — ONDANSETRON HCL 4 MG/2ML IJ SOLN: 4.0000 mg | Freq: Four times a day (QID) | INTRAMUSCULAR | Status: DC | PRN

## 2018-06-04 MED ORDER — ADULT MULTIVITAMIN W/MINERALS CH
1.0000 | ORAL_TABLET | Freq: Every day | ORAL | Status: DC
  Administered 2018-06-04 – 2018-06-05 (×2): 1 via ORAL
  Filled 2018-06-04 (×2): qty 1

## 2018-06-04 MED ORDER — APIXABAN 5 MG PO TABS
5.0000 mg | ORAL_TABLET | Freq: Two times a day (BID) | ORAL | Status: DC
  Administered 2018-06-04 – 2018-06-05 (×3): 5 mg via ORAL
  Filled 2018-06-04 (×3): qty 1

## 2018-06-04 NOTE — Care Management Note (Signed)
Case Management Note  Patient Details  Name: Danielle [name] MRN: [id number] Date of Birth: 1956-02-26  Subjective/Objective:                 Patient presents to ED due to pain at ostomy site which is approximately one month old. Found to have acute renal failure Her baseline creatine is .85 and was 2.01 on day of admission. She currently receives home health nursing and PT with [facility]    Action/Plan:  Notified agency of admission  Expected Discharge Date:  06/06/18               Expected Discharge Plan:     In-House Referral:     Discharge planning Services     Post Acute Care Choice:    Choice offered to:     DME Arranged:    DME Agency:     HH Arranged:    [place] Agency:     Status of Service:     If discussed at [employer], dates discussed:    Additional Comments:  Katrina Stack, RN 06/04/2018, 10:26 AM

## 2018-06-04 NOTE — Progress Notes (Signed)
[place] [facility] [place] NAME: Teagyn Fishel    MR#:  [id number]  DATE OF BIRTH:  Mar 14, 1956  SUBJECTIVE:   Pain over the ostomy site due to irritation from the bag leak REVIEW OF SYSTEMS:   Review of Systems  Constitutional: Negative for chills, fever and weight loss.  HENT: Negative for ear discharge, ear pain and nosebleeds.   Eyes: Negative for blurred vision, pain and discharge.  Respiratory: Negative for sputum production, shortness of breath, wheezing and stridor.   Cardiovascular: Negative for chest pain, palpitations, orthopnea and PND.  Gastrointestinal: Negative for abdominal pain, diarrhea, nausea and vomiting.  Genitourinary: Negative for frequency and urgency.  Musculoskeletal: Negative for back pain and joint pain.  Neurological: Negative for sensory change, speech change, focal weakness and weakness.  Psychiatric/Behavioral: Negative for depression and hallucinations. The patient is not nervous/anxious.    Tolerating Diet:yes Tolerating PT: not needed  DRUG ALLERGIES:  No Known Allergies  VITALS:  Blood pressure 99/72, pulse 77, temperature (!) 97.5 F (36.4 C), resp. rate 18, height 5\' 7"  (1.702 m), weight 54.8 kg, SpO2 100 %.  PHYSICAL EXAMINATION:   Physical Exam  GENERAL:  62 y.o.-year-old patient lying in the bed with no acute distress.  EYES: Pupils equal, round, reactive to light and accommodation. No scleral icterus. Extraocular muscles intact.  HEENT: Head atraumatic, normocephalic. Oropharynx and nasopharynx clear.  NECK:  Supple, no jugular venous distention. No thyroid enlargement, no tenderness.  LUNGS: Normal breath sounds bilaterally, no wheezing, rales, rhonchi. No use of accessory muscles of respiration.  CARDIOVASCULAR: S1, S2 normal. No murmurs, rubs, or gallops.  ABDOMEN: Soft, nontender, nondistended. Bowel sounds present. No organomegaly or mass. Colostomy+ and leaky ostomy bag on the  left EXTREMITIES: No cyanosis, clubbing or edema b/l.    NEUROLOGIC: Cranial nerves II through XII are intact. No focal Motor or sensory deficits b/l.   PSYCHIATRIC:  patient is alert and oriented x 3.  SKIN: No obvious rash, lesion, or ulcer.   LABORATORY PANEL:  CBC Recent Labs  Lab 06/04/18 0336  WBC 7.9  HGB 9.8*  HCT 29.7*  PLT 276    Chemistries  Recent Labs  Lab 06/04/18 0336  NA 134*  K 4.5  CL 117*  CO2 13*  GLUCOSE 92  BUN 35*  CREATININE 1.53*  CALCIUM 8.5*  MG 1.5*   Cardiac Enzymes No results for input(s): TROPONINI in the last 168 hours. RADIOLOGY:  No results found. ASSESSMENT AND PLAN:   Verita Kuroda  is a 62 y.o. female with a known history of previous lung cancer, chronic respiratory failure, COPD, CHF, hypertension.  Patient is status post recent right hemicolectomy with ileostomy due to ischemic colitis, approximately 1 month ago. Patient presented to emergency room due to intermittent pain around the site of her recent ostomy, going on for the past 24 hours.  She noted a small amount of blood coming out of ostomy once yesterday  1.  Acute renal failure, likely prerenal.  It might be related to Lasix use up until few days ago.  We will start gentle IV hydration and monitor kidney function closely.  Will hold diuretics at this time. -2.01--1.53  2.  Pain at the ostomy site due to leaky area, mild and intermittent, unclear etiology.  No mass or bleeding noted.  -appreciate Dr Darrol Poke input -seen by Wound care  3.  COPD, without exacerbation.  Continue maintenance therapy.  4.  Systolic CHF, with  ejection fraction around 35%.  Currently, clinically compensated, continue medical treatment.  No diuretic needed at this time.  5.  History of previous lung cancer, treated with radiation.  Continue management per oncology.  6.  Paroxysmal atrial fibrillation.  Currently, in sinus rhythm, rate controlled.  Continue metoprolol for rate control and  Eliquis for anticoagulation.  Case discussed with Care Management/Social Worker. Management plans discussed with the patient, family and they are in agreement.  CODE STATUS: full  DVT Prophylaxis: eliquis  TOTAL TIME TAKING CARE OF THIS PATIENT: *25* minutes.  >50% time spent on counselling and coordination of care  POSSIBLE D/C IN *1-2 DAYS, DEPENDING ON CLINICAL CONDITION.  Note: This dictation was prepared with Dragon dictation along with smaller phrase technology. Any transcriptional errors that result from this process are unintentional.  Fritzi Mandes M.D on 06/04/2018 at 8:46 PM  Between 7am to 6pm - Pager - [phone]  After 6pm go to [email or web address] - password [email or web address] [facility]  Office  [phone]  CC: Primary care physician; Elisabeth Cara, NPPatient ID: [name], female   DOB: 16-Jan-1956, 62 y.o.   MRN: [id number]

## 2018-06-04 NOTE — Progress Notes (Signed)
Per Dr. Windell Moment RN to place order for wound nurse to come see pt regarding her ileostomy.

## 2018-06-04 NOTE — Progress Notes (Signed)
Patient ID: Danielle [name], female   DOB: Mar 07, 1956, 62 y.o.   MRN: [id number]     [place] Hospital Day(s): 0.   Post op day(s):  [name]   Interval History: Patient seen and examined. Patient re admitted due to acute renal failure most likely due to high output per ostomy as per history by daughter, but not quantified. At the moment of evaluation the patient refer significant pain around the ostomy. Ostomy appliance seen with leakage. Patient refers eating well and ostomy working properly.   Vital signs in last 24 hours: [min-max] current  Temp:  [97.5 F (36.4 C)-97.9 F (36.6 C)] 97.5 F (36.4 C) (08/21 1318) Pulse Rate:  [72-85] 77 (08/21 1318) Resp:  [18-20] 18 (08/21 0511) BP: (92-118)/(58-81) 99/72 (08/21 1318) SpO2:  [99 %-100 %] 100 % (08/21 1318) Weight:  [54.3 kg-68.9 kg] 54.8 kg (08/21 1017)     Height: 5\' 7"  (170.2 cm) Weight: 54.8 kg BMI (Calculated): 18.93   Physical Exam:  Constitutional: alert, cooperative and no distress  Respiratory: breathing non-labored at rest  Gastrointestinal: soft, non-tender, and non-distended. Ostomy wound with significant irritation from stool leak and tender.   Labs:  CBC Latest Ref Rng & Units 06/04/2018 06/03/2018 05/20/2018  WBC 3.6 - 11.0 K/uL 7.9 8.7 11.5(H)  Hemoglobin 12.0 - 16.0 g/dL 9.8(L) 9.9(L) 10.5(L)  Hematocrit 35.0 - 47.0 % 29.7(L) 29.6(L) 32.1(L)  Platelets 150 - 440 K/uL 276 296 570(H)   CMP Latest Ref Rng & Units 06/04/2018 06/03/2018 05/20/2018  Glucose 70 - 99 mg/dL 92 117(H) 102(H)  BUN 8 - 23 mg/dL 35(H) 40(H) 24(H)  Creatinine 0.44 - 1.00 mg/dL 1.53(H) 2.01(H) 0.85  Sodium 135 - 145 mmol/L 134(L) 131(L) 135  Potassium 3.5 - 5.1 mmol/L 4.5 4.4 3.9  Chloride 98 - 111 mmol/L 117(H) 112(H) 105  CO2 22 - 32 mmol/L 13(L) 14(L) 20(L)  Calcium 8.9 - 10.3 mg/dL 8.5(L) 8.9 8.8(L)  Total Protein 6.5 - 8.1 g/dL - - 6.9  Total Bilirubin 0.3 - 1.2 mg/dL - - 1.2  Alkaline Phos 38 - 126 U/L - - 137(H)  AST 15 - 41 U/L  - - 60(H)  ALT 0 - 44 U/L - - 90(H)    Imaging studies: No new pertinent imaging studies   Assessment/Plan:  62 year old female with acute renal failure most likely due to high ostomy output. Today it has been 300 mL charted. The stool on the bag seen today is pasty with formed particle, not completely liquid. This is the consistency that should be seen. Will complete 24 hours of output chart to see if patient needs imodium. Also patient with significant irritation due to stool leak from ostomy appliance. Wound care gave care of ostomy site today. Will re evaluate in 24 hours to see if it controls the leak and improves the pain.  Agree with current management. Will follow wound and ostomy output.   Arnold Long, MD

## 2018-06-04 NOTE — Progress Notes (Signed)
Initial Nutrition Assessment  DOCUMENTATION CODES:   Severe malnutrition in context of acute illness/injury  INTERVENTION:   Add Megace 338m po BID  Nepro Shake po BID, each supplement provides 425 kcal and 19 grams protein  MVI daily  Liberalize diet   Pt at high refeeding risk; recommend monitor K, Mg and P labs   NUTRITION DIAGNOSIS:   Severe Malnutrition related to acute illness as evidenced by 12 percent weight loss in 1 month, moderate to severe fat depletions, moderate to severe muscle depletions.  GOAL:   Patient will meet greater than or equal to 90% of their needs  MONITOR:   PO intake, Supplement acceptance, Labs, Weight trends, I & O's, Skin  REASON FOR ASSESSMENT:   Other (Comment)(low BMI)    ASSESSMENT:   62year old female with PMHx of arthritis, HTN, COPD, anxiety, depression, CHF, breast cancer s/p left mastectomy and chemotherapy/XRT, lymphedema, radiation pneumonitis/fibrosis, recent admit for respiratory failure and ischemic colitis requiring right hemicolectomy with end ileostomy and mucus fistula 7/21 now presenting with pain and bloody discharge from ostomy    Met with pt in room today. Pt well known to this RD from recent previous admit where patient received TPN from 7/19-8/3 s/p ischemic colitis requiring ileostomy. Pt struggled with volume overload during her previous admit; pt gained 25lbs, developed SOB and required decreased TPN rate and diuresis. Pt non complaint with supplements during her whole admit. Pt did have improvement of her appetite and oral intake at discharge but was still not eating well. TPN discontinued on day of discharge. Pt was ~14lbs above her UBW at discharge making it difficult to determine if pt had any weight loss while hospitalized. Today, pt is ~17lbs(12%) less than her UBW and pt noted to have developed severe muscle and fat depletions since last admit. Pt reports that she has not continued supplements at home but  reports that she has been eating. RD suspects pt with poor appetite and oral intake pta. Pt has now developed severe malnutrition. Pt reports increased output from her ostomy bag, reports she empties the bag 3 times a day and that it is full every time she empties it. RD again discussed with pt today the importance of adequate protein and calorie intake. Pt is aware that she has had significant wt loss. Pt agrees to drink butter pecan Nepro. RD will add supplements and MVI. Pt ate only bites of her breakfast this morning. RD will liberalize pt's diet. Suspect pt is at high refeeding risk secondary to poor po intake. Spoke to MD, will plan to iintiate Megace today to try and improve po intake.      Medications reviewed and include: colace, NaCl _0 /hr  Labs reviewed: Na 134(L), K 4.5 wnl, BUN 35(H), creat 1.52(H) Hgb 9.8(L), Hct 29.7(L)  NUTRITION - FOCUSED PHYSICAL EXAM:    Most Recent Value  Orbital Region  Moderate depletion  Upper Arm Region  Severe depletion  Thoracic and Lumbar Region  Moderate depletion  Buccal Region  Moderate depletion  Temple Region  Moderate depletion  Clavicle Bone Region  Mild depletion  Clavicle and Acromion Bone Region  Mild depletion  Scapular Bone Region  Moderate depletion  Dorsal Hand  Severe depletion  Patellar Region  Moderate depletion  Anterior Thigh Region  Moderate depletion  Posterior Calf Region  Moderate depletion  Edema (RD Assessment)  None  Hair  Reviewed  Eyes  Reviewed  Mouth  Reviewed  Skin  Reviewed  Nails  Reviewed  Diet Order:   Diet Order            Diet regular Room service appropriate? Yes; Fluid consistency: Thin  Diet effective now             EDUCATION NEEDS:   Education needs have been addressed  Skin:  Skin Assessment: Reviewed RN Assessment(closed incision abdomen )  Last BM:  8/21- 361m via ostomy   Height:   Ht Readings from Last 1 Encounters:  06/04/18 _0  (1.702 m)    Weight:   Wt  Readings from Last 1 Encounters:  06/04/18 54.8 kg    Ideal Body Weight:  61.36 kg  BMI:  Body mass index is 18.94 kg/m.  Estimated Nutritional Needs:   Kcal:  1600-1800kcal/day   Protein:  82-93g/day   Fluid:  >1.6L/day   CKoleen DistanceMS, RD, LDN Pager #- [phone]#- [phone] Hours Pager: [phone]

## 2018-06-04 NOTE — H&P (Signed)
[place] [facility] [place] NAME: Danielle [name]    MR#:  [id number]  DATE OF BIRTH:  1956-04-27  DATE OF ADMISSION:  06/03/2018  PRIMARY CARE PHYSICIAN: Elisabeth Cara, NP   REQUESTING/REFERRING PHYSICIAN:   CHIEF COMPLAINT:   Chief Complaint  Patient presents with  . Post-op Problem    HISTORY OF PRESENT ILLNESS: Danielle [name]  is a 62 y.o. female with a known history of previous lung cancer, chronic respiratory failure, COPD, CHF, hypertension.  Patient is status post recent right hemicolectomy with ileostomy due to ischemic colitis, approximately 1 month ago. Patient presented to emergency room due to intermittent pain around the site of her recent ostomy, going on for the past 24 hours.  She noted a small amount of blood coming out of ostomy once yesterday. She also complains of generalized weakness and fatigue going on for the past week or so.  No chest pain, no shortness of breath, no fever or chills, no nausea/vomiting/diarrhea. On detailed questioning, patient admits to high output through the ostomy.  She states she is drinking a lot of water. Test done emergency room are remarkable for hemoglobin level at 9.9, sodium level of 131, elevated creatinine level at 2, elevated BUN at 40.  UA is pending. Is admitted for further evaluation and treatment.   PAST MEDICAL HISTORY:   Past Medical History:  Diagnosis Date  . Anxiety   . Arthritis   . Cancer ([place]) left    breast cancer 2000, chemo tx's with total mastectomy and lymph nodes resected.   . Cancer of right lung ([place]) 02/21/2016   rad tx's.   . CHF (congestive heart failure) ([place])   . COPD (chronic obstructive pulmonary disease) ([place])   . Dependence on supplemental oxygen   . Depression   . Diabetes mellitus without complication ([place])   . Heart murmur   . Hypertension   . Lung nodule   . Lymphedema   . Shortness of breath dyspnea    with exertion  . Status post  chemotherapy 2001   left breast cancer  . Status post radiation therapy 2001   left breast cancer    PAST SURGICAL HISTORY:  Past Surgical History:  Procedure Laterality Date  . Breast Biospy Left    [facility]  . BREAST SURGERY    . COLONOSCOPY N/A 04/30/2018   Procedure: COLONOSCOPY;  Surgeon: Virgel Manifold, MD;  Location: [facility] ENDOSCOPY;  Service: Endoscopy;  Laterality: N/A;  . DILATION AND CURETTAGE OF UTERUS    . ELECTROMAGNETIC NAVIGATION BROCHOSCOPY Right 04/11/2016   Procedure: ELECTROMAGNETIC NAVIGATION BRONCHOSCOPY;  Surgeon: Vilinda Boehringer, MD;  Location: [facility] ORS;  Service: Cardiopulmonary;  Laterality: Right;  . ESOPHAGOGASTRODUODENOSCOPY (EGD) WITH PROPOFOL N/A 05/07/2018   Procedure: ESOPHAGOGASTRODUODENOSCOPY (EGD) WITH PROPOFOL;  Surgeon: Lucilla Lame, MD;  Location: [facility] ENDOSCOPY;  Service: Endoscopy;  Laterality: N/A;  . history of colonoscopy]    . LAPAROTOMY Right 05/04/2018   Procedure: EXPLORATORY LAPAROTOMY right colectomy right and left ostomy;  Surgeon: Herbert Pun, MD;  Location: [facility] ORS;  Service: General;  Laterality: Right;  . LUNG BIOPSY    . MASTECTOMY Left    2000, [facility]  . ROTATOR CUFF REPAIR Right    [facility]    SOCIAL HISTORY:  Social History   Tobacco Use  . Smoking status: Former Smoker    Packs/day: 0.50    Years: 20.00    Pack years: 10.00    Types: Cigarettes  Last attempt to quit: 07/02/2012    Years since quitting: 5.9  . Smokeless tobacco: Never Used  . Tobacco comment: quit 2014  Substance Use Topics  . Alcohol use: Yes    Comment: Occasionally    FAMILY HISTORY:  Family History  Problem Relation Age of Onset  . Breast cancer Mother 68  . Cancer Mother        Breast   . Cirrhosis Father   . Breast cancer Paternal Aunt 67  . Cancer Maternal Aunt        Breast     DRUG ALLERGIES: No Known Allergies  REVIEW OF SYSTEMS:   CONSTITUTIONAL: No fever, but positive for fatigue generalized weakness.  EYES: No  changes in vision.  EARS, NOSE, AND THROAT: No tinnitus or ear pain.  RESPIRATORY: No cough, shortness of breath, wheezing or hemoptysis.  CARDIOVASCULAR: No chest pain, orthopnea, edema.  GASTROINTESTINAL: No nausea, vomiting, diarrhea.  Positive for pain around the ostomy site.  GENITOURINARY: No dysuria, hematuria.  ENDOCRINE: No polyuria, nocturia. HEMATOLOGY: No bleeding. SKIN: No rash or lesion. MUSCULOSKELETAL: No joint pain at this time.   NEUROLOGIC: No focal weakness.  PSYCHIATRY: No anxiety or depression.   MEDICATIONS AT HOME:  Prior to Admission medications   Medication Sig Start Date End Date Taking? Authorizing Provider  albuterol (PROVENTIL HFA;VENTOLIN HFA) 108 (90 Base) MCG/ACT inhaler Inhale 2 puffs into the lungs every 6 (six) hours as needed for wheezing or shortness of breath. 04/24/18  Yes Demetrios Loll, MD  apixaban (ELIQUIS) 5 MG TABS tablet Take 1 tablet (5 mg total) by mouth 2 (two) times daily. 05/17/18  Yes Mody, Ulice Bold, MD  budesonide-formoterol (SYMBICORT) 160-4.5 MCG/ACT inhaler Inhale 2 puffs into the lungs 2 (two) times daily. 04/24/18  Yes Demetrios Loll, MD  citalopram (CELEXA) 40 MG tablet Take 40 mg by mouth daily.    Yes [provider]  metoprolol succinate (TOPROL XL) 50 MG 24 hr tablet Take 3 tablets (150 mg total) by mouth daily. Take with or immediately following a meal. 04/26/18 04/26/19 Yes Demetrios Loll, MD  furosemide (LASIX) 40 MG tablet Take 1 tablet (40 mg total) by mouth 2 (two) times daily. Patient not taking: Reported on 06/03/2018 05/17/18   Bettey Costa, MD  HYDROcodone-acetaminophen (NORCO/VICODIN) 5-325 MG tablet Take 1 tablet by mouth every 6 (six) hours as needed for moderate pain or severe pain. Patient not taking: Reported on 06/03/2018 05/17/18   Bettey Costa, MD  potassium chloride (K-DUR) 10 MEQ tablet Take 1 tablet (10 mEq total) by mouth daily. Patient not taking: Reported on 06/03/2018 05/17/18   Bettey Costa, MD      PHYSICAL EXAMINATION:    VITAL SIGNS: Blood pressure 115/69, pulse 84, resp. rate 20, height 5\' 7"  (1.702 m), weight 68.9 kg, SpO2 99 %.  GENERAL:  62 y.o.-year-old patient lying in the bed with no acute distress, at rest.  She is very thin.  Dry mucous membranes are noted. EYES: Pupils equal, round, reactive to light and accommodation. No scleral icterus. Extraocular muscles intact.  HEENT: Head atraumatic, normocephalic. Oropharynx and nasopharynx clear.  NECK:  Supple, no jugular venous distention. No thyroid enlargement, no tenderness.  LUNGS: Normal breath sounds bilaterally, no wheezing, rales,rhonchi or crepitation. No use of accessory muscles of respiration.  CARDIOVASCULAR: S1, S2 normal. No S3/S4.  ABDOMEN: Soft, nontender, nondistended. Bowel sounds present. No organomegaly or mass.  Ostomy is in place, functional, the bag is currently empty. EXTREMITIES: No pedal edema, cyanosis, or  clubbing.  NEUROLOGIC: Cranial nerves II through XII are intact. Muscle strength 5/5 in all extremities. Sensation intact.   PSYCHIATRIC: The patient is alert and oriented x 3.  SKIN: No obvious rash, lesion, or ulcer.   LABORATORY PANEL:   CBC Recent Labs  Lab 06/03/18 2239  WBC 8.7  HGB 9.9*  HCT 29.6*  PLT 296  MCV 91.7  MCH 30.7  MCHC 33.5  RDW 16.2*  LYMPHSABS 2.5  MONOABS 0.7  EOSABS 0.2  BASOSABS 0.1   ------------------------------------------------------------------------------------------------------------------  Chemistries  Recent Labs  Lab 06/03/18 2239  NA 131*  K 4.4  CL 112*  CO2 14*  GLUCOSE 117*  BUN 40*  CREATININE 2.01*  CALCIUM 8.9   ------------------------------------------------------------------------------------------------------------------ estimated creatinine clearance is 27.5 mL/min (A) (by C-G formula based on SCr of 2.01 mg/dL (H)). ------------------------------------------------------------------------------------------------------------------ No results for  input(s): TSH, T4TOTAL, T3FREE, THYROIDAB in the last 72 hours.  Invalid input(s): FREET3   Coagulation profile No results for input(s): INR, PROTIME in the last 168 hours. ------------------------------------------------------------------------------------------------------------------- No results for input(s): DDIMER in the last 72 hours. -------------------------------------------------------------------------------------------------------------------  Cardiac Enzymes No results for input(s): CKMB, TROPONINI, MYOGLOBIN in the last 168 hours.  Invalid input(s): CK ------------------------------------------------------------------------------------------------------------------ Invalid input(s): POCBNP  ---------------------------------------------------------------------------------------------------------------  Urinalysis    Component Value Date/Time   COLORURINE STRAW (A) 05/20/2018 0923   APPEARANCEUR CLEAR (A) 05/20/2018 0923   APPEARANCEUR Clear 12/06/2013 2245   LABSPEC 1.008 05/20/2018 0923   LABSPEC 1.015 12/06/2013 2245   PHURINE 6.0 05/20/2018 0923   GLUCOSEU NEGATIVE 05/20/2018 0923   GLUCOSEU Negative 12/06/2013 2245   HGBUR MODERATE (A) 05/20/2018 0923   BILIRUBINUR NEGATIVE 05/20/2018 0923   BILIRUBINUR Negative 12/06/2013 2245   KETONESUR NEGATIVE 05/20/2018 0923   PROTEINUR NEGATIVE 05/20/2018 0923   NITRITE NEGATIVE 05/20/2018 0923   LEUKOCYTESUR NEGATIVE 05/20/2018 0923   LEUKOCYTESUR Negative 12/06/2013 2245     RADIOLOGY: No results found.  EKG: Orders placed or performed during the hospital encounter of 04/21/18  . ED EKG  . ED EKG  . EKG 12-Lead  . EKG 12-Lead    IMPRESSION AND PLAN:  1.  Acute renal failure, likely prerenal.  It might be related to Lasix use up until few days ago.  We will start gentle IV hydration and monitor kidney function closely.  Will hold diuretics at this time. 2.  Pain at the ostomy site, mild and  intermittent, unclear etiology.  No mass or bleeding noted.  Will have general surgery evaluate the patient. 3.  COPD, without exacerbation.  Continue maintenance therapy. 4.  Systolic CHF, with ejection fraction around 35%.  Currently, clinically compensated, continue medical treatment.  No diuretic needed at this time. 5.  History of previous lung cancer, treated with radiation.  Continue management per oncology. 6.  Paroxysmal atrial fibrillation.  Currently, in sinus rhythm, rate controlled.  Continue metoprolol for rate control and Eliquis for anticoagulation.   All the records are reviewed and case discussed with ED provider. Management plans discussed with the patient, who is in agreement.  CODE STATUS: Code Status History    Date Active Date Inactive Code Status Order ID Comments User Context   04/21/2018 0633 05/17/2018 2214 Full Code [id number]  Arta Silence, MD Inpatient   02/09/2017 1125 02/11/2017 1706 Full Code [id number]  Henreitta Leber, MD Inpatient       TOTAL TIME TAKING CARE OF THIS PATIENT: 45 minutes.    Amelia Jo M.D on 06/04/2018 at 12:40 AM  Between 7am to  6pm - Pager - [phone]  After 6pm go to [email or web address] - password [email or web address] [facility] [place] [facility]  [phone]  CC: Primary care physician; Elisabeth Cara, NP

## 2018-06-04 NOTE — Telephone Encounter (Signed)
Radiology called and reports that patient is in hospital and not expected to be discharged by tomorrow. Her PET Scan is being cancelled and will need to be rescheduled after she is discharged

## 2018-06-04 NOTE — Telephone Encounter (Signed)
Colette, please r/s pet scan once patient is d/c from [facility]

## 2018-06-04 NOTE — Consult Note (Signed)
[place] Nurse ostomy consult note Stoma type/location: RLQ ileostomy LLQ mucus fistula Stomal assessment/size: RLQ ileostomy.  Recessed stoma.  Food particulates present beneath the skin.  Corn and hulls are removed.   Peristomal breakdown circumferentially, very tender to touch Peristomal assessment: see above Treatment options for stomal/peristomal skin: Due to recessed stoma, I am applying a convex pouching system due to frequent leaks.  Need to assess if peristomal breakdown is from leakage or other etiology.  Surgery has been consulted Output loose brown stool with large amounts of corn in pouch., Pouch is leaking Ostomy pouching: 2pc. 2 1/4" pouch, convex Education provided: none Enrolled patient in [place] program:No [place] team will follow Domenic Moras RN BSN [facility] Pager [phone]

## 2018-06-04 NOTE — Progress Notes (Signed)
MEDICATION RELATED CONSULT NOTE - INITIAL   Pharmacy Consult for management of electrolytes Indication: high risk of refeeding syndrome  No Known Allergies  Patient Measurements: Height: 5\' 7"  (170.2 cm) Weight: 120 lb 14.4 oz (54.8 kg) IBW/kg (Calculated) : 61.6  Vital Signs: Temp: 97.5 F (36.4 C) (08/21 1318) Temp Source: Oral (08/21 0511) BP: 99/72 (08/21 1318) Pulse Rate: 77 (08/21 1318) Intake/Output from previous day: 08/20 0701 - 08/21 0700 In: 1000 [IV Piggyback:1000] Out: 300 [Stool:300] Intake/Output from this shift: Total I/O In: 772 [P.O.:120; I.V.:652] Out: -   Labs: Recent Labs    06/03/18 2239 06/04/18 0336  WBC 8.7 7.9  HGB 9.9* 9.8*  HCT 29.6* 29.7*  PLT 296 276  CREATININE 2.01* 1.53*  MG  --  1.5*  PHOS  --  4.2   Estimated Creatinine Clearance: 32.1 mL/min (A) (by C-G formula based on SCr of 1.53 mg/dL (H)).   Microbiology: No results found for this or any previous visit (from the past 720 hour(s)).  Medical History: Past Medical History:  Diagnosis Date  . Anxiety   . Arthritis   . Cancer ([place]) left    breast cancer 2000, chemo tx's with total mastectomy and lymph nodes resected.   . Cancer of right lung ([place]) 02/21/2016   rad tx's.   . CHF (congestive heart failure) ([place])   . COPD (chronic obstructive pulmonary disease) ([place])   . Dependence on supplemental oxygen   . Depression   . Diabetes mellitus without complication ([place])   . Heart murmur   . Hypertension   . Lung nodule   . Lymphedema   . Shortness of breath dyspnea    with exertion  . Status post chemotherapy 2001   left breast cancer  . Status post radiation therapy 2001   left breast cancer    Assessment: Severe Malnutrition related to acute illness as evidenced by 12 percent weight loss in 1 month, moderate to severe fat depletions, moderate to severe muscle depletions. Neopro and Megace added by dietary. Currently only magnesium below lower limit   Plan:   Replace magnesium with 2 grams IV once. We will follow-up with morning labs and adjust as necessary  Dallie Piles, PharmD 06/04/2018,2:36 PM

## 2018-06-05 ENCOUNTER — Ambulatory Visit: Payer: Self-pay

## 2018-06-05 LAB — BASIC METABOLIC PANEL
Anion gap: 2 — ABNORMAL LOW (ref 5–15)
BUN: 27 mg/dL — AB (ref 8–23)
CALCIUM: 8.2 mg/dL — AB (ref 8.9–10.3)
CHLORIDE: 119 mmol/L — AB (ref 98–111)
CO2: 14 mmol/L — AB (ref 22–32)
CREATININE: 1.02 mg/dL — AB (ref 0.44–1.00)
GFR calc non Af Amer: 57 mL/min — ABNORMAL LOW (ref >60)
Glucose, Bld: 96 mg/dL (ref 70–99)
Potassium: 4.4 mmol/L (ref 3.5–5.1)
SODIUM: 135 mmol/L (ref 135–145)

## 2018-06-05 LAB — PHOSPHORUS: PHOSPHORUS: 3.1 mg/dL (ref 2.5–4.6)

## 2018-06-05 LAB — MAGNESIUM: Magnesium: 2.2 mg/dL (ref 1.7–2.4)

## 2018-06-05 LAB — GLUCOSE, CAPILLARY: Glucose-Capillary: 90 mg/dL (ref 70–99)

## 2018-06-05 MED ORDER — ADULT MULTIVITAMIN W/MINERALS CH: 1.0000 | ORAL_TABLET | Freq: Every day | ORAL | 1 refills | Status: DC

## 2018-06-05 MED ORDER — HYDROCODONE-ACETAMINOPHEN 5-325 MG PO TABS: 1.0000 | ORAL_TABLET | Freq: Four times a day (QID) | ORAL | 0 refills | Status: DC | PRN

## 2018-06-05 MED ORDER — NEPRO/CARBSTEADY PO LIQD: 237.0000 mL | Freq: Two times a day (BID) | ORAL | 1 refills | Status: DC

## 2018-06-05 MED ORDER — MEGESTROL ACETATE 400 MG/10ML PO SUSP: 300.0000 mg | Freq: Two times a day (BID) | ORAL | 0 refills | Status: DC

## 2018-06-05 NOTE — Progress Notes (Signed)
Danielle [name]  A and O x 4. VSS. Pt tolerating diet well. No complaints of pain or nausea. IV removed intact, prescriptions given. Pt voiced understanding of discharge instructions with no further questions. Pt discharged via wheelchair with nurse.    Allergies as of 06/05/2018   No Known Allergies     Medication List    STOP taking these medications   furosemide 40 MG tablet Commonly known as:  LASIX   potassium chloride 10 MEQ tablet Commonly known as:  K-DUR     TAKE these medications   albuterol 108 (90 Base) MCG/ACT inhaler Commonly known as:  PROVENTIL HFA;VENTOLIN HFA Inhale 2 puffs into the lungs every 6 (six) hours as needed for wheezing or shortness of breath.   apixaban 5 MG Tabs tablet Commonly known as:  ELIQUIS Take 1 tablet (5 mg total) by mouth 2 (two) times daily.   budesonide-formoterol 160-4.5 MCG/ACT inhaler Commonly known as:  SYMBICORT Inhale 2 puffs into the lungs 2 (two) times daily.   citalopram 40 MG tablet Commonly known as:  CELEXA Take 40 mg by mouth daily.   feeding supplement (NEPRO CARB STEADY) Liqd Take 237 mLs by mouth 2 (two) times daily between meals.   HYDROcodone-acetaminophen 5-325 MG tablet Commonly known as:  NORCO/VICODIN Take 1 tablet by mouth every 6 (six) hours as needed for moderate pain or severe pain.   megestrol 400 MG/10ML suspension Commonly known as:  MEGACE Take 7.5 mLs (300 mg total) by mouth 2 (two) times daily.   metoprolol succinate 50 MG 24 hr tablet Commonly known as:  TOPROL-XL Take 3 tablets (150 mg total) by mouth daily. Take with or immediately following a meal.   multivitamin with minerals Tabs tablet Take 1 tablet by mouth daily. Start taking on:  06/06/2018       Vitals:   06/05/18 0417 06/05/18 0843  BP: 117/60 (!) 115/58  Pulse: 80 78  Resp: 18   Temp: 98.1 F (36.7 C)   SpO2: 99%     Francesco Sor

## 2018-06-05 NOTE — Progress Notes (Signed)
Per MD okay for RN to DC iv fluids.

## 2018-06-05 NOTE — Discharge Summary (Addendum)
[facility] NAME: Danielle [name]    MR#:  [id number]  DATE OF BIRTH:  Jul 30, 1956  DATE OF ADMISSION:  06/03/2018 ADMITTING PHYSICIAN: Amelia Jo, MD  DATE OF DISCHARGE: 06/05/2018  PRIMARY CARE PHYSICIAN: Elisabeth Cara, NP    ADMISSION DIAGNOSIS:  Post-operative pain [G89.18] AKI (acute kidney injury) ([place]) [N17.9]  DISCHARGE DIAGNOSIS:  Right LQ ileostomy peristomal pain and redness due to leak Acute renal failure--resolved SECONDARY DIAGNOSIS:   Past Medical History:  Diagnosis Date  . Anxiety   . Arthritis   . Cancer ([place]) left    breast cancer 2000, chemo tx's with total mastectomy and lymph nodes resected.   . Cancer of right lung ([place]) 02/21/2016   rad tx's.   . CHF (congestive heart failure) ([place])   . COPD (chronic obstructive pulmonary disease) ([place])   . Dependence on supplemental oxygen   . Depression   . Diabetes mellitus without complication ([place])   . Heart murmur   . Hypertension   . Lung nodule   . Lymphedema   . Shortness of breath dyspnea    with exertion  . Status post chemotherapy 2001   left breast cancer  . Status post radiation therapy 2001   left breast cancer    HOSPITAL COURSE:   BarbaraPennyis a62 y.o.femalewith a known history of previous lung cancer, chronic respiratory failure, COPD, CHF,hypertension.Patient is status post recent right hemicolectomy with ileostomy due to ischemic colitis,approximately 1 month ago. Patient presented to emergency room due to intermittent pain around the site of her recent ostomy,going on for the past 24 hours.She noted a small amount of blood coming out of ostomy once yesterday  1.Acute renal failure,likely prerenal.It might be related to Lasix use up until few days ago. -improved with IV hydration and monitor kidney function closely.Will hold diuretics at this time. -2.01--1.53--1.02  2.Pain at the right LQ ostomy site  due to leaky area,mild and intermittent,unclear etiology.No mass or bleeding noted.  -appreciate Dr Darrol Poke input -seen by Wound care--and appreciate input. Will set up [facility] for wound care  3.COPD, without exacerbation. -Continue maintenance therapy.  4.chronicSystolic CHF,with ejection fraction around 35%.Currently, clinically compensated,continue medical treatment.No diuretic needed at this time. Pt not in HF  5.History of previous lung cancer,treated with radiation.Continue management per oncology.  6.Paroxysmal atrial fibrillation.Currently, in sinus rhythm, rate controlled.Continue metoprolol for rate control and Eliquis for anticoagulation.  D/w dter D/w surgery and ok to go home  CONSULTS OBTAINED:  Treatment Team:  Herbert Pun, MD  DRUG ALLERGIES:  No Known Allergies  DISCHARGE MEDICATIONS:   Allergies as of 06/05/2018   No Known Allergies     Medication List    STOP taking these medications   furosemide 40 MG tablet Commonly known as:  LASIX   potassium chloride 10 MEQ tablet Commonly known as:  K-DUR     TAKE these medications   albuterol 108 (90 Base) MCG/ACT inhaler Commonly known as:  PROVENTIL HFA;VENTOLIN HFA Inhale 2 puffs into the lungs every 6 (six) hours as needed for wheezing or shortness of breath.   apixaban 5 MG Tabs tablet Commonly known as:  ELIQUIS Take 1 tablet (5 mg total) by mouth 2 (two) times daily.   budesonide-formoterol 160-4.5 MCG/ACT inhaler Commonly known as:  SYMBICORT Inhale 2 puffs into the lungs 2 (two) times daily.   citalopram 40 MG tablet Commonly known as:  CELEXA Take 40 mg by mouth daily.   feeding supplement (  NEPRO CARB STEADY) Liqd Take 237 mLs by mouth 2 (two) times daily between meals.   HYDROcodone-acetaminophen 5-325 MG tablet Commonly known as:  NORCO/VICODIN Take 1 tablet by mouth every 6 (six) hours as needed for moderate pain or severe pain.    megestrol 400 MG/10ML suspension Commonly known as:  MEGACE Take 7.5 mLs (300 mg total) by mouth 2 (two) times daily.   metoprolol succinate 50 MG 24 hr tablet Commonly known as:  TOPROL-XL Take 3 tablets (150 mg total) by mouth daily. Take with or immediately following a meal.   multivitamin with minerals Tabs tablet Take 1 tablet by mouth daily. Start taking on:  06/06/2018       If you experience worsening of your admission symptoms, develop shortness of breath, life threatening emergency, suicidal or homicidal thoughts you must seek medical attention immediately by calling 911 or calling your MD immediately  if symptoms less severe.  You Must read complete instructions/literature along with all the possible adverse reactions/side effects for all the Medicines you take and that have been prescribed to you. Take any new Medicines after you have completely understood and accept all the possible adverse reactions/side effects.   Please note  You were cared for by a hospitalist during your hospital stay. If you have any questions about your discharge medications or the care you received while you were in the hospital after you are discharged, you can call the unit and asked to speak with the hospitalist on call if the hospitalist that took care of you is not available. Once you are discharged, your primary care physician will handle any further medical issues. Please note that NO REFILLS for any discharge medications will be authorized once you are discharged, as it is imperative that you return to your primary care physician (or establish a relationship with a primary care physician if you do not have one) for your aftercare needs so that they can reassess your need for medications and monitor your lab values. Today   SUBJECTIVE   Overall improving  VITAL SIGNS:  Blood pressure (!) 115/58, pulse 78, temperature 98.1 F (36.7 C), temperature source Oral, resp. rate 18, height 5\' 7"   (1.702 m), weight 56.4 kg, SpO2 99 %.  I/O:    Intake/Output Summary (Last 24 hours) at 06/05/2018 1055 Last data filed at 06/05/2018 1044 Gross per 24 hour  Intake 1216.51 ml  Output 950 ml  Net 266.51 ml    PHYSICAL EXAMINATION:  GENERAL:  62 y.o.-year-old patient lying in the bed with no acute distress.  EYES: Pupils equal, round, reactive to light and accommodation. No scleral icterus. Extraocular muscles intact.  HEENT: Head atraumatic, normocephalic. Oropharynx and nasopharynx clear.  NECK:  Supple, no jugular venous distention. No thyroid enlargement, no tenderness.  LUNGS: Normal breath sounds bilaterally, no wheezing, rales,rhonchi or crepitation. No use of accessory muscles of respiration.  CARDIOVASCULAR: S1, S2 normal. No murmurs, rubs, or gallops.  ABDOMEN: Soft, non-tender, non-distended. Bowel sounds present. No organomegaly or mass. Right LQ ileostomy and left side mucous fistula + EXTREMITIES: No pedal edema, cyanosis, or clubbing.  NEUROLOGIC: Cranial nerves II through XII are intact. Muscle strength 5/5 in all extremities. Sensation intact. Gait not checked.  PSYCHIATRIC:patient is alert and oriented x 3.  SKIN: No obvious rash, lesion, or ulcer.   DATA REVIEW:   CBC  Recent Labs  Lab 06/04/18 0336  WBC 7.9  HGB 9.8*  HCT 29.7*  PLT 276    Chemistries  Recent  Labs  Lab 06/05/18 0441  NA 135  K 4.4  CL 119*  CO2 14*  GLUCOSE 96  BUN 27*  CREATININE 1.02*  CALCIUM 8.2*  MG 2.2    Microbiology Results   No results found for this or any previous visit (from the past 240 hour(s)).  RADIOLOGY:  No results found.   Management plans discussed with the patient, family and they are in agreement.  CODE STATUS:     Code Status Orders  (From admission, onward)         Start     Ordered   06/04/18 0114  Full code  Continuous     06/04/18 0113        Code Status History    Date Active Date Inactive Code Status Order ID Comments User  Context   04/21/2018 0633 05/17/2018 2214 Full Code [id number]  Arta Silence, MD Inpatient   02/09/2017 1125 02/11/2017 1706 Full Code [id number]  Henreitta Leber, MD Inpatient      TOTAL TIME TAKING CARE OF THIS PATIENT: *40* minutes.    Fritzi Mandes M.D on 06/05/2018 at 10:55 AM  Between 7am to 6pm - Pager - [phone] After 6pm go to [email or web address] - password [email or web address]  Office  [phone]  CC: Primary care physician; Elisabeth Cara, NP

## 2018-06-05 NOTE — Consult Note (Signed)
[place] Nurse ostomy follow up Daughter arrived to bedside and discussed ostomy pouching needs for mucous fistula and ileostomy.  Reviewed crusting to protect skin, pouch change schedules and emptying, and process for ordering supplies.  3 sets of barrier rings, convex pouches, and one piece pouches given to take home.  Belt, ostomy, powder, and skin prep also given, as well as ostomy catalogue marked with products.  Daughter asked appropriate questions. Please re-consult if further assistance is needed.  Thank-you,  Julien Girt MSN, [place], [place], [place], [place]

## 2018-06-05 NOTE — Progress Notes (Signed)
Patient ID: Danielle [name], female   DOB: 1956/04/07, 62 y.o.   MRN: [id number]     [facility] Day(s): 1.   Post op day(s):  [name]   Interval History: Patient seen and examined, no acute events or new complaints overnight. Patient reports pain on periostomal area but otherwise feeling good. Refers eating well.   Vital signs in last 24 hours: [min-max] current  Temp:  [97.5 F (36.4 C)-98.2 F (36.8 C)] 98.1 F (36.7 C) (08/22 0417) Pulse Rate:  [77-80] 78 (08/22 0843) Resp:  [10-18] 18 (08/22 0417) BP: (99-117)/(58-72) 115/58 (08/22 0843) SpO2:  [99 %-100 %] 99 % (08/22 0417) Weight:  [54.8 kg-56.4 kg] 56.4 kg (08/22 0251)     Height: 5\' 7"  (170.2 cm) Weight: 56.4 kg BMI (Calculated): 19.47   Intake/Output this shift:  Ostomy output: 500 mL with pasty stool.   Physical Exam:  Constitutional: alert, cooperative and no distress  Gastrointestinal: soft, non-tender, and non-distended. Irritation around the ostomy wound, no ulcer, no skin opening. Retracted ostomy. Midline wound with opening with adequate granulation tissue.   Labs:  CBC Latest Ref Rng & Units 06/04/2018 06/03/2018 05/20/2018  WBC 3.6 - 11.0 K/uL 7.9 8.7 11.5(H)  Hemoglobin 12.0 - 16.0 g/dL 9.8(L) 9.9(L) 10.5(L)  Hematocrit 35.0 - 47.0 % 29.7(L) 29.6(L) 32.1(L)  Platelets 150 - 440 K/uL 276 296 570(H)   CMP Latest Ref Rng & Units 06/05/2018 06/04/2018 06/03/2018  Glucose 70 - 99 mg/dL 96 92 117(H)  BUN 8 - 23 mg/dL 27(H) 35(H) 40(H)  Creatinine 0.44 - 1.00 mg/dL 1.02(H) 1.53(H) 2.01(H)  Sodium 135 - 145 mmol/L 135 134(L) 131(L)  Potassium 3.5 - 5.1 mmol/L 4.4 4.5 4.4  Chloride 98 - 111 mmol/L 119(H) 117(H) 112(H)  CO2 22 - 32 mmol/L 14(L) 13(L) 14(L)  Calcium 8.9 - 10.3 mg/dL 8.2(L) 8.5(L) 8.9  Total Protein 6.5 - 8.1 g/dL - - -  Total Bilirubin 0.3 - 1.2 mg/dL - - -  Alkaline Phos 38 - 126 U/L - - -  AST 15 - 41 U/L - - -  ALT 0 - 44 U/L - - -    Imaging studies: No new pertinent imaging  studies   Assessment/Plan:  62 year old female with acute renal failure most likely due to high ostomy output.   Ostomy output improved and consistency is paste like stool. Creatinine down to 1.02.   Peri ostomy wound with irritated skin due to retraction of ileostomy and stool causing burn on skin. I evaluated the wound with Ostomy Nurse Adline Peals and both agree that the problem is the irritation of skin due to stool on skin and the fold of skin. There is no sign of more severe condition as Pyoderma Gangrenosum and there is no indication for biopsy. Local care given to improve skin irritation. Ostomy appliance place to improve leak.  Patient needs adequate local care at home which is not receiving at this moment to be able to heal the skin. Wound care will put the recommendations.   Regarding acute renal failure, Hospitalist treated the condition and patient responded well.   Will follow patient at the clinic.    Arnold Long, MD

## 2018-06-05 NOTE — Progress Notes (Signed)
MEDICATION RELATED CONSULT NOTE - INITIAL   Pharmacy Consult for management of electrolytes Indication: high risk of refeeding syndrome  No Known Allergies  Patient Measurements: Height: 5\' 7"  (170.2 cm) Weight: 124 lb 5.4 oz (56.4 kg) IBW/kg (Calculated) : 61.6  Vital Signs: Temp: 98.1 F (36.7 C) (08/22 0417) Temp Source: Oral (08/22 0417) BP: 115/58 (08/22 0843) Pulse Rate: 78 (08/22 0843) Intake/Output from previous day: 08/21 0701 - 08/22 0700 In: 1707.3 [P.O.:120; I.V.:1547.8; IV Piggyback:39.4] Out: 550 [Urine:250; Stool:300] Intake/Output from this shift: Total I/O In: 281.2 [P.O.:240; I.V.:41.2] Out: 400 [Urine:200; Stool:200]  Labs: Recent Labs    06/03/18 2239 06/04/18 0336 06/05/18 0441  WBC 8.7 7.9  --   HGB 9.9* 9.8*  --   HCT 29.6* 29.7*  --   PLT 296 276  --   CREATININE 2.01* 1.53* 1.02*  MG  --  1.5* 2.2  PHOS  --  4.2 3.1   Estimated Creatinine Clearance: 49.6 mL/min (A) (by C-G formula based on SCr of 1.02 mg/dL (H)).  Medical History: Past Medical History:  Diagnosis Date  . Anxiety   . Arthritis   . Cancer ([place]) left    breast cancer 2000, chemo tx's with total mastectomy and lymph nodes resected.   . Cancer of right lung ([place]) 02/21/2016   rad tx's.   . CHF (congestive heart failure) ([place])   . COPD (chronic obstructive pulmonary disease) ([place])   . Dependence on supplemental oxygen   . Depression   . Diabetes mellitus without complication ([place])   . Heart murmur   . Hypertension   . Lung nodule   . Lymphedema   . Shortness of breath dyspnea    with exertion  . Status post chemotherapy 2001   left breast cancer  . Status post radiation therapy 2001   left breast cancer    Assessment: Severe Malnutrition related to acute illness as evidenced by 12 percent weight loss in 1 month, moderate to severe fat depletions, moderate to severe muscle depletions. Neopro and Megace added by dietary. Currently all electrolytes are wnl following  yesterday's magnesium replacement  Plan:  No further electrolyte replacement is warranted at this time. We will follow-up with morning labs and adjust as necessary  Dallie Piles, PharmD 06/05/2018,10:48 AM

## 2018-06-05 NOTE — Care Management Note (Signed)
Case Management Note  Patient Details  Name: Danielle [name] MRN: [id number] Date of Birth: 1956/10/11  Subjective/Objective:    Patient to be discharged per MD order. Orders in place for home health services. Patient currently open to [place] care. Orders in place for resumption of PT and RN services. Patient had an ostomy 1 month ago and unfortunately never received supplies. Ostomy nurse educated and consulted with patient here in hospital. Nursing to provide supplies until [facility] can deliver. Referral confirmed with Danielle [name] from [facility] who accepts the resumption. No DME needs. Daughter in the room will provide transport.   Danielle Bloomer RN BSN RNCM [phone]                  Action/Plan:   Expected Discharge Date:  06/05/18               Expected Discharge Plan:  [place]  In-House Referral:     Discharge planning Services  CM Consult  Post Acute Care Choice:  Resumption of Svcs/PTA Provider Choice offered to:  Patient  DME Arranged:    DME Agency:  [facility] Arranged:  RN, PT [facility] Agency:  [place]  Status of Service:  Completed, signed off  If discussed at [place] of Stay Meetings, dates discussed:    Additional Comments:  Danielle Maudlin, RN 06/05/2018, 11:16 AM

## 2018-06-05 NOTE — Consult Note (Addendum)
[place] Nurse ostomy consult note Surgical team following for assessment and plan of care to abd wound. Surgical team at bedside; pouches removed to assess peristomal skin Stoma type/location: RLQ ileostomy/LLQ mucus fistula Stomal assessment/size: RLQ ileostomy; stoma approx 3/4 inch, red and viable, flush with skin level, red macerated skin surrounding peristomal skin to 2 cm, painful to touch. Treatment options for stomal/peristomal skin: Applied skin prep and demonstrated crusting with powder. Pt has a skin fold at 3:00 o'clock which occurs when she sits up and is contributing to leakage.  Current pouch was leaking behind the wafer. Barrier ring to attempt to maintain a seal, and one piece flexible convex pouch with a belt added.  Mod amt semi-formed brown stool. Left mucous fistula is flush with skin level, red and viable, 1 inch.  Applied flexible one piece pouch, small amt mucous drainage. Education provided: Demonstrated pouch change to patient; no family members present.  She attempted to call her daughter, but there was no response. Enrolled patient in [place] program: Yes Supplies at the bedside for staff nurse use and marked a catalogue for ordering supplies when patient is at home.  Julien Girt MSN, RN, [place], [place], [place]

## 2018-06-06 ENCOUNTER — Inpatient Hospital Stay: Payer: MEDICAID | Admitting: Internal Medicine

## 2018-06-09 ENCOUNTER — Encounter: Payer: Self-pay | Admitting: Family

## 2018-06-09 ENCOUNTER — Ambulatory Visit: Payer: Medicaid Other | Attending: Family | Admitting: Family

## 2018-06-09 DIAGNOSIS — Z9981 Dependence on supplemental oxygen: Secondary | ICD-10-CM | POA: Insufficient documentation

## 2018-06-09 DIAGNOSIS — F329 Major depressive disorder, single episode, unspecified: Secondary | ICD-10-CM | POA: Insufficient documentation

## 2018-06-09 DIAGNOSIS — Z79891 Long term (current) use of opiate analgesic: Secondary | ICD-10-CM | POA: Diagnosis not present

## 2018-06-09 DIAGNOSIS — Z9889 Other specified postprocedural states: Secondary | ICD-10-CM | POA: Diagnosis not present

## 2018-06-09 DIAGNOSIS — I10 Essential (primary) hypertension: Secondary | ICD-10-CM | POA: Insufficient documentation

## 2018-06-09 DIAGNOSIS — F419 Anxiety disorder, unspecified: Secondary | ICD-10-CM | POA: Insufficient documentation

## 2018-06-09 DIAGNOSIS — R002 Palpitations: Secondary | ICD-10-CM | POA: Insufficient documentation

## 2018-06-09 DIAGNOSIS — M549 Dorsalgia, unspecified: Secondary | ICD-10-CM | POA: Diagnosis not present

## 2018-06-09 DIAGNOSIS — Z79899 Other long term (current) drug therapy: Secondary | ICD-10-CM | POA: Insufficient documentation

## 2018-06-09 DIAGNOSIS — E119 Type 2 diabetes mellitus without complications: Secondary | ICD-10-CM | POA: Diagnosis not present

## 2018-06-09 DIAGNOSIS — Z9049 Acquired absence of other specified parts of digestive tract: Secondary | ICD-10-CM | POA: Diagnosis not present

## 2018-06-09 DIAGNOSIS — Z87891 Personal history of nicotine dependence: Secondary | ICD-10-CM | POA: Insufficient documentation

## 2018-06-09 DIAGNOSIS — I5022 Chronic systolic (congestive) heart failure: Secondary | ICD-10-CM | POA: Insufficient documentation

## 2018-06-09 DIAGNOSIS — R000 Tachycardia, unspecified: Secondary | ICD-10-CM | POA: Diagnosis not present

## 2018-06-09 DIAGNOSIS — I4892 Unspecified atrial flutter: Secondary | ICD-10-CM | POA: Insufficient documentation

## 2018-06-09 DIAGNOSIS — Z9012 Acquired absence of left breast and nipple: Secondary | ICD-10-CM | POA: Diagnosis not present

## 2018-06-09 DIAGNOSIS — M1990 Unspecified osteoarthritis, unspecified site: Secondary | ICD-10-CM | POA: Insufficient documentation

## 2018-06-09 DIAGNOSIS — Z85118 Personal history of other malignant neoplasm of bronchus and lung: Secondary | ICD-10-CM | POA: Diagnosis not present

## 2018-06-09 DIAGNOSIS — Z803 Family history of malignant neoplasm of breast: Secondary | ICD-10-CM | POA: Insufficient documentation

## 2018-06-09 DIAGNOSIS — I110 Hypertensive heart disease with heart failure: Secondary | ICD-10-CM | POA: Insufficient documentation

## 2018-06-09 DIAGNOSIS — Z853 Personal history of malignant neoplasm of breast: Secondary | ICD-10-CM | POA: Diagnosis not present

## 2018-06-09 DIAGNOSIS — J449 Chronic obstructive pulmonary disease, unspecified: Secondary | ICD-10-CM | POA: Diagnosis not present

## 2018-06-09 DIAGNOSIS — Z7901 Long term (current) use of anticoagulants: Secondary | ICD-10-CM | POA: Insufficient documentation

## 2018-06-09 DIAGNOSIS — I480 Paroxysmal atrial fibrillation: Secondary | ICD-10-CM | POA: Insufficient documentation

## 2018-06-09 DIAGNOSIS — I4891 Unspecified atrial fibrillation: Secondary | ICD-10-CM | POA: Diagnosis not present

## 2018-06-09 DIAGNOSIS — I5023 Acute on chronic systolic (congestive) heart failure: Secondary | ICD-10-CM | POA: Insufficient documentation

## 2018-06-09 DIAGNOSIS — I890 Lymphedema, not elsewhere classified: Secondary | ICD-10-CM | POA: Insufficient documentation

## 2018-06-09 NOTE — Progress Notes (Signed)
Patient ID: Danielle [name], female    DOB: 10-19-1955, 62 y.o.   MRN: [id number]  HPI  Danielle [name] is a 62 y/o female with a history of breast/ lung cancer, DM, HTN, COPD, anxiety, depression, lymphedema, previous tobacco use and chronic heart failure.   Echo report from 04/21/18 reviewed and showed an EF of 35-40% along with mild AS/ trivial AR and moderate MR.   Admitted 06/03/18 due to acute renal failure. Given IV hydration with improvement of renal function. Wound care consult obtained due to leakage from ostomy site. Discharged after 2 days. Was in the ED 05/23/18 due to fever and redness around ostomy site. Treated with antibiotics and released. Was in the ED 05/20/18 due to rectal bleeding. Surgery saw patient and she was released. Admitted 04/21/18 due to COPD/HF exacerbation. Initially needed IV lasix and then transitioned to oral diuretics along with prednisone. Pulmonology, vascular, cardiology, surgery, GI, oncology and palliative care consults were all obtained. Head CT done due to acute encephalopathy which was negative. Needed TPN due to severe malnutrition and then advanced to a soft diet. Had ischemic colitis which required surgery (right hemicolectomy and an ileostomy). Discharged after 26 days.   She presents today for her initial visit with a chief complaint of minimal shortness of breath upon moderate exertion. She describes this as chronic in nature having been present for many months. She has associated palpitations, back pain, anxiety, gradual weight gain and pedal edema along with this. She denies any difficulty sleeping, abdominal distention, chest pain, fatigue or dizziness. Diuretic was stopped during recent admission due to renal injury and since discharge, her edema is returning.   Past Medical History:  Diagnosis Date  . Anxiety   . Arthritis   . Cancer ([place]) left    breast cancer 2000, chemo tx's with total mastectomy and lymph nodes resected.   . Cancer of right lung ([employer])  02/21/2016   rad tx's.   . CHF (congestive heart failure) ([place])   . COPD (chronic obstructive pulmonary disease) ([place])   . Dependence on supplemental oxygen   . Depression   . Diabetes mellitus without complication ([place])   . Heart murmur   . Hypertension   . Lung nodule   . Lymphedema   . Shortness of breath dyspnea    with exertion  . Status post chemotherapy 2001   left breast cancer  . Status post radiation therapy 2001   left breast cancer   Past Surgical History:  Procedure Laterality Date  . Breast Biospy Left    [facility]  . BREAST SURGERY    . COLONOSCOPY N/A 04/30/2018   Procedure: COLONOSCOPY;  Surgeon: Virgel Manifold, MD;  Location: [facility] ENDOSCOPY;  Service: Endoscopy;  Laterality: N/A;  . DILATION AND CURETTAGE OF UTERUS    . ELECTROMAGNETIC NAVIGATION BROCHOSCOPY Right 04/11/2016   Procedure: ELECTROMAGNETIC NAVIGATION BRONCHOSCOPY;  Surgeon: Vilinda Boehringer, MD;  Location: [facility] ORS;  Service: Cardiopulmonary;  Laterality: Right;  . ESOPHAGOGASTRODUODENOSCOPY (EGD) WITH PROPOFOL N/A 05/07/2018   Procedure: ESOPHAGOGASTRODUODENOSCOPY (EGD) WITH PROPOFOL;  Surgeon: Lucilla Lame, MD;  Location: [facility] ENDOSCOPY;  Service: Endoscopy;  Laterality: N/A;  . history of colonoscopy]    . LAPAROTOMY Right 05/04/2018   Procedure: EXPLORATORY LAPAROTOMY right colectomy right and left ostomy;  Surgeon: Herbert Pun, MD;  Location: [facility] ORS;  Service: General;  Laterality: Right;  . LUNG BIOPSY    . MASTECTOMY Left    2000, [facility]  . ROTATOR CUFF REPAIR Right  [facility]   Family History  Problem Relation Age of Onset  . Breast cancer Mother 61  . Cancer Mother        Breast   . Cirrhosis Father   . Breast cancer Paternal Aunt 18  . Cancer Maternal Aunt        Breast    Social History   Tobacco Use  . Smoking status: Former Smoker    Packs/day: 0.50    Years: 20.00    Pack years: 10.00    Types: Cigarettes    Last attempt to quit: 07/02/2012    Years since quitting:  5.9  . Smokeless tobacco: Never Used  . Tobacco comment: quit 2014  Substance Use Topics  . Alcohol use: Yes    Comment: Occasionally   No Known Allergies Prior to Admission medications   Medication Sig Start Date End Date Taking? Authorizing Provider  albuterol (PROVENTIL HFA;VENTOLIN HFA) 108 (90 Base) MCG/ACT inhaler Inhale 2 puffs into the lungs every 6 (six) hours as needed for wheezing or shortness of breath. 04/24/18  Yes Demetrios Loll, MD  apixaban (ELIQUIS) 5 MG TABS tablet Take 1 tablet (5 mg total) by mouth 2 (two) times daily. 05/17/18  Yes Mody, Ulice Bold, MD  budesonide-formoterol (SYMBICORT) 160-4.5 MCG/ACT inhaler Inhale 2 puffs into the lungs 2 (two) times daily. 04/24/18  Yes Demetrios Loll, MD  citalopram (CELEXA) 40 MG tablet Take 40 mg by mouth daily.    Yes [provider]  HYDROcodone-acetaminophen (NORCO/VICODIN) 5-325 MG tablet Take 1 tablet by mouth every 6 (six) hours as needed for moderate pain or severe pain. 06/05/18  Yes Fritzi Mandes, MD  megestrol (MEGACE) 400 MG/10ML suspension Take 7.5 mLs (300 mg total) by mouth 2 (two) times daily. 06/05/18  Yes Fritzi Mandes, MD  metoprolol succinate (TOPROL XL) 50 MG 24 hr tablet Take 3 tablets (150 mg total) by mouth daily. Take with or immediately following a meal. 04/26/18 04/26/19 Yes Demetrios Loll, MD  Multiple Vitamin (MULTIVITAMIN WITH MINERALS) TABS tablet Take 1 tablet by mouth daily. 06/06/18  Yes Fritzi Mandes, MD  Nutritional Supplements (FEEDING SUPPLEMENT, NEPRO CARB STEADY,) LIQD Take 237 mLs by mouth 2 (two) times daily between meals. 06/05/18  Yes Fritzi Mandes, MD     Review of Systems  Constitutional: Positive for appetite change. Negative for fatigue.  HENT: Positive for rhinorrhea. Negative for congestion and sore throat.   Eyes: Negative.   Respiratory: Positive for shortness of breath (minimal). Negative for chest tightness.   Cardiovascular: Positive for palpitations and leg swelling. Negative for chest pain.   Gastrointestinal: Negative for abdominal distention and abdominal pain.  Endocrine: Negative.   Genitourinary: Negative.   Musculoskeletal: Positive for back pain. Negative for neck pain.  Skin: Negative.   Allergic/Immunologic: Negative.   Neurological: Negative for dizziness and light-headedness.  Hematological: Negative for adenopathy. Does not bruise/bleed easily.  Psychiatric/Behavioral: Negative for dysphoric mood and sleep disturbance (sleeping on 2 pillows). The patient is nervous/anxious.    Vitals:   06/09/18 1413  BP: 103/69  Pulse: (!) 105  Resp: 18  SpO2: 100%  Weight: 126 lb 2 oz (57.2 kg)  Height: 5\' 7"  (1.702 m)   Wt Readings from Last 3 Encounters:  06/09/18 126 lb 2 oz (57.2 kg)  06/05/18 124 lb 5.4 oz (56.4 kg)  05/20/18 152 lb (68.9 kg)   Lab Results  Component Value Date   CREATININE 1.02 (H) 06/05/2018   CREATININE 1.53 (H) 06/04/2018   CREATININE 2.01 (H)  06/03/2018    Physical Exam  Constitutional: She is oriented to person, place, and time. She appears well-developed and well-nourished.  HENT:  Head: Normocephalic and atraumatic.  Neck: Normal range of motion. Neck supple. No JVD present.  Cardiovascular: An irregular rhythm present. Tachycardia present.  Pulmonary/Chest: Effort normal. No respiratory distress. She has no wheezes. She has no rales.  Abdominal: Soft. She exhibits no distension.  Musculoskeletal:       Right lower leg: She exhibits edema (1+ pitting). She exhibits no tenderness.       Left lower leg: She exhibits edema (1+ pitting). She exhibits no tenderness.  Neurological: She is alert and oriented to person, place, and time.  Skin: Skin is warm and dry.  Psychiatric: She has a normal mood and affect. Her behavior is normal.  Nursing note and vitals reviewed.  Assessment & Plan:  1: Chronic heart failure with reduced ejection fraction- - NYHA class II - mildly fluid overloaded today - weighing daily and says that her  weight has gradually risen since hospital discharge when her diuretic was stopped - reminded to call for an overnight weight gain of >2 pounds or a weekly weight gain of >5 pounds - will resume furosemide but at a reduced dose of 20mg  daily - [facility] coming twice weekly so will send order for them to check a BMP next week and fax results to [place] - wearing oxygen at 2L as needed - BP will not allow initiation of entresto - BNP 05/02/18 was >4500.0  2: HTN- - BP looks good although on the low side - BMP 06/05/18 reviewed and showed sodium 135, potassium 4.4, creatinine 1.02 and GFR >60  3: Atrial fibrillation- - saw cardiology Lyndel Pleasure) 10/23/17 - currently tachycardic; may need to titrate up metoprolol succinate if able  4: Lymphedema- - stage 2 - limited in her ability to exercise due to shortness of breath - encouraged her to put on TED hose daily with removal at bedtime - also encouraged her to elevate her legs when sitting for long periods of time - could consider lymphapress compression boots if edema persists  Medication list was reviewed.  Return in 2 weeks or sooner for any questions/problems before then.

## 2018-06-09 NOTE — Patient Instructions (Addendum)
Continue weighing daily and call for an overnight weight gain of > 2 pounds or a weekly weight gain of >5 pounds.  Resume furosemide (lasix) 20mg  daily      Low-Sodium Eating Plan Sodium, which is an element that makes up salt, helps you maintain a healthy balance of fluids in your body. Too much sodium can increase your blood pressure and cause fluid and waste to be held in your body. Your health care provider or dietitian may recommend following this plan if you have high blood pressure (hypertension), kidney disease, liver disease, or heart failure. Eating less sodium can help lower your blood pressure, reduce swelling, and protect your heart, liver, and kidneys. What are tips for following this plan? General guidelines  Most people on this plan should limit their sodium intake to 1,500-2,000 mg (milligrams) of sodium each day. Reading food labels  The Nutrition Facts label lists the amount of sodium in one serving of the food. If you eat more than one serving, you must multiply the listed amount of sodium by the number of servings.  Choose foods with less than 140 mg of sodium per serving.  Avoid foods with 300 mg of sodium or more per serving. Shopping  Look for lower-sodium products, often labeled as "low-sodium" or "no salt added."  Always check the sodium content even if foods are labeled as "unsalted" or "no salt added".  Buy fresh foods. ? Avoid canned foods and premade or frozen meals. ? Avoid canned, cured, or processed meats  Buy breads that have less than 80 mg of sodium per slice. Cooking  Eat more home-cooked food and less restaurant, buffet, and fast food.  Avoid adding salt when cooking. Use salt-free seasonings or herbs instead of table salt or sea salt. Check with your health care provider or pharmacist before using salt substitutes.  Cook with plant-based oils, such as canola, sunflower, or olive oil. Meal planning  When eating at a restaurant, ask that  your food be prepared with less salt or no salt, if possible.  Avoid foods that contain MSG (monosodium glutamate). MSG is sometimes added to [place] food, bouillon, and some canned foods. What foods are recommended? The items listed may not be a complete list. Talk with your dietitian about what dietary choices are best for you. Grains Low-sodium cereals, including oats, puffed wheat and rice, and shredded wheat. Low-sodium crackers. Unsalted rice. Unsalted pasta. Low-sodium bread. Whole-grain breads and whole-grain pasta. Vegetables Fresh or frozen vegetables. "No salt added" canned vegetables. "No salt added" tomato sauce and paste. Low-sodium or reduced-sodium tomato and vegetable juice. Fruits Fresh, frozen, or canned fruit. Fruit juice. Meats and other protein foods Fresh or frozen (no salt added) meat, poultry, seafood, and fish. Low-sodium canned tuna and salmon. Unsalted nuts. Dried peas, beans, and lentils without added salt. Unsalted canned beans. Eggs. Unsalted nut butters. Dairy Milk. Soy milk. Cheese that is naturally low in sodium, such as ricotta cheese, fresh mozzarella, or Swiss cheese Low-sodium or reduced-sodium cheese. Cream cheese. Yogurt. Fats and oils Unsalted butter. Unsalted margarine with no trans fat. Vegetable oils such as canola or olive oils. Seasonings and other foods Fresh and dried herbs and spices. Salt-free seasonings. Low-sodium mustard and ketchup. Sodium-free salad dressing. Sodium-free light mayonnaise. Fresh or refrigerated horseradish. Lemon juice. Vinegar. Homemade, reduced-sodium, or low-sodium soups. Unsalted popcorn and pretzels. Low-salt or salt-free chips. What foods are not recommended? The items listed may not be a complete list. Talk with your dietitian about what dietary  choices are best for you. Grains Instant hot cereals. Bread stuffing, pancake, and biscuit mixes. Croutons. Seasoned rice or pasta mixes. Noodle soup cups. Boxed or frozen  macaroni and cheese. Regular salted crackers. Self-rising flour. Vegetables Sauerkraut, pickled vegetables, and relishes. Olives. [place] fries. Onion rings. Regular canned vegetables (not low-sodium or reduced-sodium). Regular canned tomato sauce and paste (not low-sodium or reduced-sodium). Regular tomato and vegetable juice (not low-sodium or reduced-sodium). Frozen vegetables in sauces. Meats and other protein foods Meat or fish that is salted, canned, smoked, spiced, or pickled. Bacon, ham, sausage, hotdogs, corned beef, chipped beef, packaged lunch meats, salt pork, jerky, pickled herring, anchovies, regular canned tuna, sardines, salted nuts. Dairy Processed cheese and cheese spreads. Cheese curds. Blue cheese. Feta cheese. String cheese. Regular cottage cheese. Buttermilk. Canned milk. Fats and oils Salted butter. Regular margarine. Ghee. Bacon fat. Seasonings and other foods Onion salt, garlic salt, seasoned salt, table salt, and sea salt. Canned and packaged gravies. Worcestershire sauce. Tartar sauce. Barbecue sauce. Teriyaki sauce. Soy sauce, including reduced-sodium. Steak sauce. Fish sauce. Oyster sauce. Cocktail sauce. Horseradish that you find on the shelf. Regular ketchup and mustard. Meat flavorings and tenderizers. Bouillon cubes. Hot sauce and Tabasco sauce. Premade or packaged marinades. Premade or packaged taco seasonings. Relishes. Regular salad dressings. Salsa. Potato and tortilla chips. Corn chips and puffs. Salted popcorn and pretzels. Canned or dried soups. Pizza. Frozen entrees and pot pies. Summary  Eating less sodium can help lower your blood pressure, reduce swelling, and protect your heart, liver, and kidneys.  Most people on this plan should limit their sodium intake to 1,500-2,000 mg (milligrams) of sodium each day.  Canned, boxed, and frozen foods are high in sodium. Restaurant foods, fast foods, and pizza are also very high in sodium. You also get sodium by adding  salt to food.  Try to cook at home, eat more fresh fruits and vegetables, and eat less fast food, canned, processed, or prepared foods. This information is not intended to replace advice given to you by your health care provider. Make sure you discuss any questions you have with your health care provider. Document Released: 03/23/2002 Document Revised: 09/24/2016 Document Reviewed: 09/24/2016 Elsevier Interactive Patient Education  [employer].

## 2018-06-12 ENCOUNTER — Telehealth: Payer: Self-pay | Admitting: *Deleted

## 2018-06-12 ENCOUNTER — Ambulatory Visit: Admission: RE | Admit: 2018-06-12 | Payer: Self-pay | Source: Ambulatory Visit

## 2018-06-12 NOTE — Telephone Encounter (Signed)
Reviewed apts on pt's chart- looks like scan was r/s on 9/3

## 2018-06-12 NOTE — Telephone Encounter (Signed)
Patient did not show up for her PET Scan this morning

## 2018-06-17 ENCOUNTER — Telehealth: Payer: Self-pay | Admitting: *Deleted

## 2018-06-17 ENCOUNTER — Ambulatory Visit: Payer: Self-pay

## 2018-06-17 NOTE — Telephone Encounter (Signed)
Patient has cnl multiple pets scans and md apts. Colette, please contact daughter to determine when pt can come to pet scan apt. Also r/s her apts with Dr. Jacinto Reap

## 2018-06-17 NOTE — Telephone Encounter (Signed)
Patient had to reschedule her PET from today to Friday due to transportation issues. She also does not have follow up appointment with you

## 2018-06-20 ENCOUNTER — Ambulatory Visit
Admission: RE | Admit: 2018-06-20 | Discharge: 2018-06-20 | Disposition: A | Discharge status: Home / Self Care | Payer: Medicaid Other | Source: Ambulatory Visit | Attending: Internal Medicine | Admitting: Internal Medicine

## 2018-06-20 DIAGNOSIS — C3411 Malignant neoplasm of upper lobe, right bronchus or lung: Secondary | ICD-10-CM | POA: Insufficient documentation

## 2018-06-20 LAB — GLUCOSE, CAPILLARY: Glucose-Capillary: 64 mg/dL — ABNORMAL LOW (ref 70–99)

## 2018-06-20 MED ORDER — FLUDEOXYGLUCOSE F - 18 (FDG) INJECTION
7.0900 | Freq: Once | INTRAVENOUS | Status: AC | PRN
  Administered 2018-06-20: 7.09 via INTRAVENOUS

## 2018-06-23 ENCOUNTER — Encounter: Payer: Self-pay | Admitting: Internal Medicine

## 2018-06-23 ENCOUNTER — Inpatient Hospital Stay: Payer: Medicaid Other | Attending: Internal Medicine | Admitting: Internal Medicine

## 2018-06-23 ENCOUNTER — Other Ambulatory Visit: Payer: Self-pay

## 2018-06-23 VITALS — BP 134/98 | HR 111 | Temp 97.6°F | Resp 20 | Ht 67.0 in | Wt 124.5 lb

## 2018-06-23 DIAGNOSIS — Z85118 Personal history of other malignant neoplasm of bronchus and lung: Secondary | ICD-10-CM | POA: Insufficient documentation

## 2018-06-23 DIAGNOSIS — E119 Type 2 diabetes mellitus without complications: Secondary | ICD-10-CM | POA: Insufficient documentation

## 2018-06-23 DIAGNOSIS — C3411 Malignant neoplasm of upper lobe, right bronchus or lung: Secondary | ICD-10-CM

## 2018-06-23 DIAGNOSIS — M129 Arthropathy, unspecified: Secondary | ICD-10-CM | POA: Diagnosis not present

## 2018-06-23 DIAGNOSIS — J449 Chronic obstructive pulmonary disease, unspecified: Secondary | ICD-10-CM | POA: Insufficient documentation

## 2018-06-23 DIAGNOSIS — Z853 Personal history of malignant neoplasm of breast: Secondary | ICD-10-CM | POA: Diagnosis not present

## 2018-06-23 DIAGNOSIS — Z9221 Personal history of antineoplastic chemotherapy: Secondary | ICD-10-CM | POA: Diagnosis not present

## 2018-06-23 DIAGNOSIS — N179 Acute kidney failure, unspecified: Secondary | ICD-10-CM | POA: Diagnosis not present

## 2018-06-23 DIAGNOSIS — Z9012 Acquired absence of left breast and nipple: Secondary | ICD-10-CM | POA: Insufficient documentation

## 2018-06-23 DIAGNOSIS — D649 Anemia, unspecified: Secondary | ICD-10-CM | POA: Diagnosis not present

## 2018-06-23 DIAGNOSIS — F418 Other specified anxiety disorders: Secondary | ICD-10-CM | POA: Diagnosis not present

## 2018-06-23 DIAGNOSIS — Z7901 Long term (current) use of anticoagulants: Secondary | ICD-10-CM | POA: Diagnosis not present

## 2018-06-23 DIAGNOSIS — Z923 Personal history of irradiation: Secondary | ICD-10-CM | POA: Diagnosis not present

## 2018-06-23 DIAGNOSIS — Z803 Family history of malignant neoplasm of breast: Secondary | ICD-10-CM | POA: Diagnosis not present

## 2018-06-23 DIAGNOSIS — I10 Essential (primary) hypertension: Secondary | ICD-10-CM | POA: Diagnosis not present

## 2018-06-23 DIAGNOSIS — Z87891 Personal history of nicotine dependence: Secondary | ICD-10-CM

## 2018-06-23 DIAGNOSIS — I509 Heart failure, unspecified: Secondary | ICD-10-CM | POA: Diagnosis not present

## 2018-06-23 MED ORDER — APIXABAN 5 MG PO TABS: 5.0000 mg | ORAL_TABLET | Freq: Two times a day (BID) | ORAL | 0 refills | Status: DC

## 2018-06-23 MED ORDER — METOPROLOL SUCCINATE ER 50 MG PO TB24: 150.0000 mg | ORAL_TABLET | Freq: Every day | ORAL | 0 refills | Status: DC

## 2018-06-23 NOTE — Assessment & Plan Note (Addendum)
#  Right upper lung nodule-s/p Bx- proven adenocarcinoma. S/p RT dec 2017. No evidence of any clinical progression.  August 2019 PET- radiation changes in right upper lobe; will plan CT scan in 6 months.  Stable.  # History of ER/PR positive HER-2/neu negative breast cancer stage III [2000]- clinically no evidence of recurrence.  Stable.  # Anemia- hb 9-10 /stable; NOT iron deficient; monitor for now.  Stable  # A.fib on eliquis; will refill for now; however further refills from PCP/cardiology; appt with cardiology tomorrow.  Stable  #History of recent lower GI bleed ischemic colitis status post hemicolectomy.  Clinically stable.  #Recent acute renal failure-high output from ostomy bag.  Currently improved.  # I reviewed the blood work- with the patient in detail; also reviewed the imaging independently [as summarized above]; and with the patient in detail.   # follow up in 3 months/labs-cbc cmp; will order CT scan at that visit.   Cc; Danielle [name]/PCP

## 2018-06-23 NOTE — Progress Notes (Signed)
[name] OFFICE PROGRESS NOTE  Patient Care Team: Elisabeth Cara, NP as PCP - General (Nurse Practitioner) [name], FNP as Nurse Practitioner (Family Medicine) [name], NP as Nurse Practitioner (Nurse Practitioner)  Cancer Staging Breast cancer in female [facility]) Staging form: Breast, AJCC 7th Edition - Clinical: Stage IIIA (T3, N1, M0) - Signed by [name], NP on 02/21/2016    Oncology History   # 2016- JAN RUL 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 A. LUNG MASS, RIGHT; CT-GUIDED BIOPSY: - ADENOCARCINOMA, ACINAR AND PAPILLARY MORPHOLOGIES. S/p RT [finished Dec 2017] s/p RT [DEC 2017]; AUG 2019- PET-right middle lobe radiation changes no evidence of recurrence.  # 2000-  Left breast cancer [T3N1- stage III] mastecomy s/p RT; s/p chemo; Tamoxifen  # NOV 111 2017- Mol testing [RML- adeno ca]  # 2019-July-aug 2019-ischemic colitis status post right hemicolectomy [Dr.[name]]; acute renal failure;  -----------------------------------------------------------------    DIAGNOSIS: RML Lung ca adeno [stage I]  STAGE:  I   ;GOALS: control  CURRENT/MOST RECENT THERAPY: surveillance         Breast cancer in female [facility])   07/24/1999 Initial Diagnosis    Breast cancer in female ([place]) T3 N1 M0 tumor ER/PR positive    08/24/1999 -  Anti-estrogen oral therapy    Started tamoxifen    08/24/1999 Surgery    Status post modified radical mastectomy of left breast     Chemotherapy        Radiation Therapy       11/24/2003 - 02/20/2009 Anti-estrogen oral therapy    Started Aromasin     Carcinoma of overlapping sites of left breast in female, estrogen receptor positive ([place])   06/22/2016 Initial Diagnosis    Cancer of overlapping sites of left female breast ([place])     Malignant neoplasm of upper lobe of right lung  ([place])      INTERVAL HISTORY:  Danielle [name] 62 y.o.  female pleasant patient above history of lung cancer stage I status post SBRT 2017; and also a remote history of breast cancer is here for follow-up/review the results of the PET scan.  Patient had a complicated clinical course over the last few months.  Patient was admitted to hospital for COPD exacerbation; and was found to have a lower GI bleed.  Patient was diagnosed with ischemic colitis for which he underwent right colectomy.  Clinical course was complicated by acute renal failure thought to be high-output from the colostomy bag.  Patient had multiple missed appointments over the last few months because of above clinical complications.  Patient is currently at home living with her daughter.  Her breathing is stable.  No nausea no vomiting.  No blood in stools no blood in stools.  Ostomy output is stable.  Review of Systems  Constitutional: Positive for malaise/fatigue and weight loss. Negative for chills, diaphoresis and fever.  HENT: Negative for nosebleeds and sore throat.   Eyes: Negative for double vision.  Respiratory: Negative for cough, hemoptysis, sputum production, shortness of breath and wheezing.   Cardiovascular: Negative for chest pain, palpitations, orthopnea and leg swelling.  Gastrointestinal: Negative for abdominal pain, blood in stool, constipation, diarrhea, heartburn, melena, nausea and vomiting.  Genitourinary: Negative for dysuria, frequency and urgency.  Musculoskeletal: Negative for back pain and joint pain.  Skin: Negative.  Negative for itching and rash.  Neurological: Negative for dizziness, tingling, focal weakness, weakness and headaches.  Endo/Heme/Allergies: Does not bruise/bleed easily.  Psychiatric/Behavioral: Negative for depression. The patient is not nervous/anxious and does not have insomnia.       PAST MEDICAL HISTORY :  Past Medical History:  Diagnosis Date  . Anxiety   . Arthritis   .  Cancer ([place]) left    breast cancer 2000, chemo tx's with total mastectomy and lymph nodes resected.   . Cancer of right lung ([place]) 02/21/2016   rad tx's.   . CHF (congestive heart failure) ([place])   . COPD (chronic obstructive pulmonary disease) ([place])   . Dependence on supplemental oxygen   . Depression   . Diabetes mellitus without complication ([place])   . Heart murmur   . Hypertension   . Lung nodule   . Lymphedema   . Shortness of breath dyspnea    with exertion  . Status post chemotherapy 2001   left breast cancer  . Status post radiation therapy 2001   left breast cancer    PAST SURGICAL HISTORY :   Past Surgical History:  Procedure Laterality Date  . Breast Biospy Left    [facility]  . BREAST SURGERY    . COLONOSCOPY N/A 04/30/2018   Procedure: COLONOSCOPY;  Surgeon: Virgel Manifold, MD;  Location: [facility] ENDOSCOPY;  Service: Endoscopy;  Laterality: N/A;  . DILATION AND CURETTAGE OF UTERUS    . ELECTROMAGNETIC NAVIGATION BROCHOSCOPY Right 04/11/2016   Procedure: ELECTROMAGNETIC NAVIGATION BRONCHOSCOPY;  Surgeon: Vilinda Boehringer, MD;  Location: [facility] ORS;  Service: Cardiopulmonary;  Laterality: Right;  . ESOPHAGOGASTRODUODENOSCOPY (EGD) WITH PROPOFOL N/A 05/07/2018   Procedure: ESOPHAGOGASTRODUODENOSCOPY (EGD) WITH PROPOFOL;  Surgeon: Lucilla Lame, MD;  Location: [facility] ENDOSCOPY;  Service: Endoscopy;  Laterality: N/A;  . history of colonoscopy]    . ILEOSTOMY    . LAPAROTOMY Right 05/04/2018   Procedure: EXPLORATORY LAPAROTOMY right colectomy right and left ostomy;  Surgeon: Herbert Pun, MD;  Location: [facility] ORS;  Service: General;  Laterality: Right;  . LUNG BIOPSY    . MASTECTOMY Left    2000, [facility]  . ROTATOR CUFF REPAIR Right    [facility]    FAMILY HISTORY :   Family History  Problem Relation Age of Onset  . Breast cancer Mother 69  . Cancer Mother        Breast   . Cirrhosis Father   . Breast cancer Paternal Aunt 67  . Cancer Maternal Aunt        Breast     SOCIAL  HISTORY:   Social History   Tobacco Use  . Smoking status: Former Smoker    Packs/day: 0.50    Years: 20.00    Pack years: 10.00    Types: Cigarettes    Last attempt to quit: 07/02/2012    Years since quitting: 5.9  . Smokeless tobacco: Never Used  . Tobacco comment: quit 2014  Substance Use Topics  . Alcohol use: Yes    Comment: Occasionally  . Drug use: No    ALLERGIES:  has No Known Allergies.  MEDICATIONS:  Current Outpatient Medications  Medication Sig Dispense Refill  . albuterol (PROVENTIL HFA;VENTOLIN HFA) 108 (90 Base) MCG/ACT inhaler Inhale 2 puffs into the lungs every 6 (six) hours as needed for wheezing or shortness of breath. 1 Inhaler 2  . budesonide-formoterol (SYMBICORT) 160-4.5 MCG/ACT inhaler Inhale 2 puffs into the lungs 2 (two) times daily. 1 Inhaler 12  . citalopram (CELEXA)  40 MG tablet Take 40 mg by mouth daily.    . furosemide (LASIX) 20 MG tablet Take 20 mg by mouth daily.     . megestrol (MEGACE) 400 MG/10ML suspension Take 7.5 mLs (300 mg total) by mouth 2 (two) times daily. 240 mL 0  . Multiple Vitamin (MULTIVITAMIN WITH MINERALS) TABS tablet Take 1 tablet by mouth daily. 30 tablet 1  . apixaban (ELIQUIS) 5 MG TABS tablet Take 1 tablet (5 mg total) by mouth 2 (two) times daily. 60 tablet 0  . metoprolol succinate (TOPROL XL) 50 MG 24 hr tablet Take 3 tablets (150 mg total) by mouth daily. Take with or immediately following a meal. 90 tablet 0   No current facility-administered medications for this visit.     PHYSICAL EXAMINATION: ECOG PERFORMANCE STATUS: 1 - Symptomatic but completely ambulatory  BP (!) 134/98   Pulse (!) 111   Temp 97.6 F (36.4 C) (Tympanic)   Resp 20   Ht '5\' 7"'$  (1.702 m)   Wt 124 lb 8 oz (56.5 kg)   BMI 19.50 kg/m   Filed Weights   06/23/18 0934  Weight: 124 lb 8 oz (56.5 kg)    Physical Exam  Constitutional: She is oriented to person, place, and time.  Thin built moderately nourished female patient in a  wheelchair.  Accompanied by daughter.  HENT:  Head: Normocephalic and atraumatic.  Mouth/Throat: Oropharynx is clear and moist. No oropharyngeal exudate.  Eyes: Pupils are equal, round, and reactive to light.  Neck: Normal range of motion. Neck supple.  Cardiovascular: Normal rate and regular rhythm.  Pulmonary/Chest: No respiratory distress. She has no wheezes.  Decreased breath sounds bilaterally.  No wheeze or crackles.  Abdominal: Soft. Bowel sounds are normal. She exhibits no distension and no mass. There is no tenderness. There is no rebound and no guarding.  Musculoskeletal: Normal range of motion. She exhibits no edema or tenderness.  Neurological: She is alert and oriented to person, place, and time.  Skin: Skin is warm.  Psychiatric: Affect normal.       LABORATORY DATA:  I have reviewed the data as listed    Component Value Date/Time   NA 135 06/05/2018 0441   NA 132 (L) 02/09/2015 1100   K 4.4 06/05/2018 0441   K 3.8 02/09/2015 1100   CL 119 (H) 06/05/2018 0441   CL 95 (L) 02/09/2015 1100   CO2 14 (L) 06/05/2018 0441   CO2 29 02/09/2015 1100   GLUCOSE 96 06/05/2018 0441   GLUCOSE 105 (H) 02/09/2015 1100   BUN 27 (H) 06/05/2018 0441   BUN 16 02/09/2015 1100   CREATININE 1.02 (H) 06/05/2018 0441   CREATININE 0.81 02/09/2015 1100   CALCIUM 8.2 (L) 06/05/2018 0441   CALCIUM 9.1 02/09/2015 1100   PROT 6.9 05/20/2018 0838   PROT 7.7 02/09/2015 1100   ALBUMIN 3.0 (L) 05/20/2018 0838   ALBUMIN 4.3 02/09/2015 1100   AST 60 (H) 05/20/2018 0838   AST 29 02/09/2015 1100   ALT 90 (H) 05/20/2018 0838   ALT 20 02/09/2015 1100   ALKPHOS 137 (H) 05/20/2018 0838   ALKPHOS 69 02/09/2015 1100   BILITOT 1.2 05/20/2018 0838   BILITOT 0.9 02/09/2015 1100   GFRNONAA 57 (L) 06/05/2018 0441   GFRNONAA >60 02/09/2015 1100   GFRAA >60 06/05/2018 0441   GFRAA >60 02/09/2015 1100    No results found for: SPEP, UPEP  Lab Results  Component Value Date   WBC 7.9 06/04/2018  NEUTROABS 5.2 06/03/2018   HGB 9.8 (L) 06/04/2018   HCT 29.7 (L) 06/04/2018   MCV 92.6 06/04/2018   PLT 276 06/04/2018      Chemistry      Component Value Date/Time   NA 135 06/05/2018 0441   NA 132 (L) 02/09/2015 1100   K 4.4 06/05/2018 0441   K 3.8 02/09/2015 1100   CL 119 (H) 06/05/2018 0441   CL 95 (L) 02/09/2015 1100   CO2 14 (L) 06/05/2018 0441   CO2 29 02/09/2015 1100   BUN 27 (H) 06/05/2018 0441   BUN 16 02/09/2015 1100   CREATININE 1.02 (H) 06/05/2018 0441   CREATININE 0.81 02/09/2015 1100      Component Value Date/Time   CALCIUM 8.2 (L) 06/05/2018 0441   CALCIUM 9.1 02/09/2015 1100   ALKPHOS 137 (H) 05/20/2018 0838   ALKPHOS 69 02/09/2015 1100   AST 60 (H) 05/20/2018 0838   AST 29 02/09/2015 1100   ALT 90 (H) 05/20/2018 0838   ALT 20 02/09/2015 1100   BILITOT 1.2 05/20/2018 0838   BILITOT 0.9 02/09/2015 1100       RADIOGRAPHIC STUDIES: I have personally reviewed the radiological images as listed and agreed with the findings in the report. No results found.   ASSESSMENT & PLAN:  Malignant neoplasm of upper lobe of right lung ([place]) # Right upper lung nodule-s/p Bx- proven adenocarcinoma. S/p RT dec 2017. No evidence of any clinical progression.  August 2019 PET- radiation changes in right upper lobe; will plan CT scan in 6 months.  Stable.  # History of ER/PR positive HER-2/neu negative breast cancer stage III [2000]- clinically no evidence of recurrence.  Stable.  # Anemia- hb 9-10 /stable; NOT iron deficient; monitor for now.  Stable  # A.fib on eliquis; will refill for now; however further refills from PCP/cardiology; appt with cardiology tomorrow.  Stable  #History of recent lower GI bleed ischemic colitis status post hemicolectomy.  Clinically stable.  #Recent acute renal failure-high output from ostomy bag.  Currently improved.  # I reviewed the blood work- with the patient in detail; also reviewed the imaging independently [as summarized above];  and with the patient in detail.   # follow up in 3 months/labs-cbc cmp; will order CT scan at that visit.   Cc; Elmyra Ricks spencer/PCP   Orders Placed This Encounter  Procedures  . Comprehensive metabolic panel    Standing Status:   Future    Standing Expiration Date:   06/24/2019  . CBC with Differential    Standing Status:   Future    Standing Expiration Date:   06/24/2019   All questions were answered. The patient knows to call the clinic with any problems, questions or concerns.      [name], MD 06/23/2018 1:13 PM

## 2018-06-23 NOTE — Progress Notes (Signed)
Patient ID: Danielle [name], female    DOB: 1956/09/20, 62 y.o.   MRN: [id number]  HPI  Danielle [name] is a 62 y/o female with a history of breast/ lung cancer, DM, HTN, COPD, anxiety, depression, lymphedema, previous tobacco use and chronic heart failure.   Echo report from 04/21/18 reviewed and showed an EF of 35-40% along with mild AS/ trivial AR and moderate MR.   Admitted 06/03/18 due to acute renal failure. Given IV hydration with improvement of renal function. Wound care consult obtained due to leakage from ostomy site. Discharged after 2 days. Was in the ED 05/23/18 due to fever and redness around ostomy site. Treated with antibiotics and released. Was in the ED 05/20/18 due to rectal bleeding. Surgery saw patient and she was released. Admitted 04/21/18 due to COPD/HF exacerbation. Initially needed IV lasix and then transitioned to oral diuretics along with prednisone. Pulmonology, vascular, cardiology, surgery, GI, oncology and palliative care consults were all obtained. Head CT done due to acute encephalopathy which was negative. Needed TPN due to severe malnutrition and then advanced to a soft diet. Had ischemic colitis which required surgery (right hemicolectomy and an ileostomy). Discharged after 26 days.   She presents today for a follow-up visit with a chief complaint of moderate shortness of breath upon minimal exertion. She says that this has been chronic in nature having been present for several years. Does feel like it has worsened since her recent hospitalization. She has associated fatigue, palpitations and headaches along with this. She denies any difficulty sleeping, abdominal distention, pedal edema, chest pain, dizziness or weight gain. Has been out of toprol XL and eliquis for the last 4 days because she can't afford them. Headed over to medication management clinic after leaving here today as she's currently without any insurance.   Past Medical History:  Diagnosis Date  . Anxiety   .  Arthritis   . Cancer ([place]) left    breast cancer 2000, chemo tx's with total mastectomy and lymph nodes resected.   . Cancer of right lung ([place]) 02/21/2016   rad tx's.   . CHF (congestive heart failure) ([place])   . COPD (chronic obstructive pulmonary disease) ([place])   . Dependence on supplemental oxygen   . Depression   . Diabetes mellitus without complication ([place])   . Heart murmur   . Hypertension   . Lung nodule   . Lymphedema   . Shortness of breath dyspnea    with exertion  . Status post chemotherapy 2001   left breast cancer  . Status post radiation therapy 2001   left breast cancer   Past Surgical History:  Procedure Laterality Date  . Breast Biospy Left    [facility]  . BREAST SURGERY    . COLONOSCOPY N/A 04/30/2018   Procedure: COLONOSCOPY;  Surgeon: Virgel Manifold, MD;  Location: [facility] ENDOSCOPY;  Service: Endoscopy;  Laterality: N/A;  . DILATION AND CURETTAGE OF UTERUS    . ELECTROMAGNETIC NAVIGATION BROCHOSCOPY Right 04/11/2016   Procedure: ELECTROMAGNETIC NAVIGATION BRONCHOSCOPY;  Surgeon: Vilinda Boehringer, MD;  Location: [facility] ORS;  Service: Cardiopulmonary;  Laterality: Right;  . ESOPHAGOGASTRODUODENOSCOPY (EGD) WITH PROPOFOL N/A 05/07/2018   Procedure: ESOPHAGOGASTRODUODENOSCOPY (EGD) WITH PROPOFOL;  Surgeon: Lucilla Lame, MD;  Location: [facility] ENDOSCOPY;  Service: Endoscopy;  Laterality: N/A;  . history of colonoscopy]    . ILEOSTOMY    . LAPAROTOMY Right 05/04/2018   Procedure: EXPLORATORY LAPAROTOMY right colectomy right and left ostomy;  Surgeon: Herbert Pun, MD;  Location:  [facility] ORS;  Service: General;  Laterality: Right;  . LUNG BIOPSY    . MASTECTOMY Left    2000, [facility]  . ROTATOR CUFF REPAIR Right    [facility]   Family History  Problem Relation Age of Onset  . Breast cancer Mother 36  . Cancer Mother        Breast   . Cirrhosis Father   . Breast cancer Paternal Aunt 27  . Cancer Maternal Aunt        Breast    Social History   Tobacco Use  . Smoking  status: Former Smoker    Packs/day: 0.50    Years: 20.00    Pack years: 10.00    Types: Cigarettes    Last attempt to quit: 07/02/2012    Years since quitting: 5.9  . Smokeless tobacco: Never Used  . Tobacco comment: quit 2014  Substance Use Topics  . Alcohol use: Yes    Comment: Occasionally   No Known Allergies  Prior to Admission medications   Medication Sig Start Date End Date Taking? Authorizing Provider  albuterol (PROVENTIL HFA;VENTOLIN HFA) 108 (90 Base) MCG/ACT inhaler Inhale 2 puffs into the lungs every 6 (six) hours as needed for wheezing or shortness of breath. 04/24/18  Yes Demetrios Loll, MD  budesonide-formoterol [facility]) 160-4.5 MCG/ACT inhaler Inhale 2 puffs into the lungs 2 (two) times daily. 04/24/18  Yes Demetrios Loll, MD  citalopram (CELEXA) 40 MG tablet Take 40 mg by mouth daily.   Yes [provider]  furosemide (LASIX) 20 MG tablet Take 20 mg by mouth daily.    Yes [provider]  HYDROcodone-acetaminophen (NORCO/VICODIN) 5-325 MG tablet Take 1 tablet by mouth every 6 (six) hours as needed for moderate pain or severe pain.   Yes [provider]  megestrol (MEGACE) 400 MG/10ML suspension Take 7.5 mLs (300 mg total) by mouth 2 (two) times daily. 06/05/18  Yes Fritzi Mandes, MD  Multiple Vitamin (MULTIVITAMIN WITH MINERALS) TABS tablet Take 1 tablet by mouth daily. 06/06/18  Yes Fritzi Mandes, MD  apixaban (ELIQUIS) 5 MG TABS tablet Take 1 tablet (5 mg total) by mouth 2 (two) times daily. Patient not taking: Reported on 06/24/2018 06/23/18   [name], MD  metoprolol succinate (TOPROL XL) 50 MG 24 hr tablet Take 3 tablets (150 mg total) by mouth daily. Take with or immediately following a meal. Patient not taking: Reported on 06/24/2018 06/23/18 06/23/19  [name], MD    Review of Systems  Constitutional: Positive for appetite change and fatigue.  HENT: Positive for rhinorrhea. Negative for congestion and sore throat.   Eyes:  Negative.   Respiratory: Positive for shortness of breath (with minimal exertion). Negative for chest tightness.   Cardiovascular: Positive for palpitations. Negative for chest pain and leg swelling.  Gastrointestinal: Negative for abdominal distention and abdominal pain.  Endocrine: Negative.   Genitourinary: Negative.   Musculoskeletal: Negative for back pain and neck pain.  Skin: Negative.   Allergic/Immunologic: Negative.   Neurological: Positive for headaches. Negative for dizziness and light-headedness.  Hematological: Negative for adenopathy. Does not bruise/bleed easily.  Psychiatric/Behavioral: Negative for dysphoric mood and sleep disturbance (sleeping on 2 pillows). The patient is not nervous/anxious.    Vitals:   06/24/18 1000  BP: 117/76  Pulse: 60  Resp: 18  SpO2: 100%  Weight: 125 lb 8 oz (56.9 kg)  Height: 5\' 7"  (1.702 m)   Wt Readings from Last 3 Encounters:  06/24/18 125 lb 8 oz (  56.9 kg)  06/23/18 124 lb 8 oz (56.5 kg)  06/09/18 126 lb 2 oz (57.2 kg)   Lab Results  Component Value Date   CREATININE 1.02 (H) 06/05/2018   CREATININE 1.53 (H) 06/04/2018   CREATININE 2.01 (H) 06/03/2018    Physical Exam  Constitutional: She is oriented to person, place, and time. She appears well-developed and well-nourished.  HENT:  Head: Normocephalic and atraumatic.  Neck: Normal range of motion. Neck supple. No JVD present.  Cardiovascular: Normal rate. An irregular rhythm present.  Pulmonary/Chest: Effort normal. No respiratory distress. She has no wheezes. She has no rales.  Abdominal: Soft. She exhibits no distension.  Musculoskeletal:       Right lower leg: She exhibits no tenderness and no edema.       Left lower leg: She exhibits no tenderness and no edema.  Neurological: She is alert and oriented to person, place, and time.  Skin: Skin is warm and dry.  Psychiatric: She has a normal mood and affect. Her behavior is normal.  Nursing note and vitals  reviewed.  Assessment & Plan:  1: Chronic heart failure with reduced ejection fraction- - NYHA class III - euvolemic today - weighing daily and she was reminded to call for an overnight weight gain of >2 pounds or a weekly weight gain of >5 pounds - weight stable from last visit here 2 weeks ago - wearing oxygen at 2L as needed - BP will not allow initiation of entresto - BNP 05/02/18 was >4500.0 - PharmD reconciled medications with the patient  2: HTN- - BP looks good although on the low side - BMP 06/05/18 reviewed and showed sodium 135, potassium 4.4, creatinine 1.02 and GFR >60  3: Atrial fibrillation- - saw cardiology Lyndel Pleasure) 10/23/17 - changing beta-blocker to carvedilol 6.25mg  BID as it's on the $4.00 [facility] list; going to medication management clinic from here to see if she qualifies for assistance - samples provided of eliquis 5mg  to take twice daily  4: Lymphedema- - stage 2 - limited in her ability to exercise due to shortness of breath - edema resolved  Medication bottles were reviewed.   Return in 3 months or sooner for any questions/problems before then.

## 2018-06-24 ENCOUNTER — Emergency Department
Admission: EM | Admit: 2018-06-24 | Discharge: 2018-06-24 | Discharge status: Against Medical Advice | Payer: Medicaid Other | Attending: Emergency Medicine | Admitting: Emergency Medicine

## 2018-06-24 ENCOUNTER — Encounter: Payer: Self-pay | Admitting: Emergency Medicine

## 2018-06-24 ENCOUNTER — Ambulatory Visit: Payer: Medicaid Other | Attending: Family | Admitting: Family

## 2018-06-24 ENCOUNTER — Encounter: Payer: Self-pay | Admitting: Family

## 2018-06-24 VITALS — BP 117/76 | HR 60 | Resp 18 | Ht 67.0 in | Wt 125.5 lb

## 2018-06-24 DIAGNOSIS — I4891 Unspecified atrial fibrillation: Secondary | ICD-10-CM | POA: Diagnosis not present

## 2018-06-24 DIAGNOSIS — Z853 Personal history of malignant neoplasm of breast: Secondary | ICD-10-CM | POA: Diagnosis not present

## 2018-06-24 DIAGNOSIS — Z79891 Long term (current) use of opiate analgesic: Secondary | ICD-10-CM | POA: Insufficient documentation

## 2018-06-24 DIAGNOSIS — Z09 Encounter for follow-up examination after completed treatment for conditions other than malignant neoplasm: Secondary | ICD-10-CM | POA: Insufficient documentation

## 2018-06-24 DIAGNOSIS — I5022 Chronic systolic (congestive) heart failure: Secondary | ICD-10-CM

## 2018-06-24 DIAGNOSIS — Z79899 Other long term (current) drug therapy: Secondary | ICD-10-CM | POA: Insufficient documentation

## 2018-06-24 DIAGNOSIS — Z9010 Acquired absence of unspecified breast and nipple: Secondary | ICD-10-CM | POA: Insufficient documentation

## 2018-06-24 DIAGNOSIS — M1990 Unspecified osteoarthritis, unspecified site: Secondary | ICD-10-CM | POA: Insufficient documentation

## 2018-06-24 DIAGNOSIS — J449 Chronic obstructive pulmonary disease, unspecified: Secondary | ICD-10-CM | POA: Insufficient documentation

## 2018-06-24 DIAGNOSIS — Z87891 Personal history of nicotine dependence: Secondary | ICD-10-CM | POA: Diagnosis not present

## 2018-06-24 DIAGNOSIS — Z9889 Other specified postprocedural states: Secondary | ICD-10-CM | POA: Insufficient documentation

## 2018-06-24 DIAGNOSIS — Z9981 Dependence on supplemental oxygen: Secondary | ICD-10-CM | POA: Diagnosis not present

## 2018-06-24 DIAGNOSIS — I110 Hypertensive heart disease with heart failure: Secondary | ICD-10-CM | POA: Insufficient documentation

## 2018-06-24 DIAGNOSIS — F329 Major depressive disorder, single episode, unspecified: Secondary | ICD-10-CM | POA: Diagnosis not present

## 2018-06-24 DIAGNOSIS — Z803 Family history of malignant neoplasm of breast: Secondary | ICD-10-CM | POA: Diagnosis not present

## 2018-06-24 DIAGNOSIS — Z5321 Procedure and treatment not carried out due to patient leaving prior to being seen by health care provider: Secondary | ICD-10-CM | POA: Insufficient documentation

## 2018-06-24 DIAGNOSIS — I10 Essential (primary) hypertension: Secondary | ICD-10-CM

## 2018-06-24 DIAGNOSIS — I890 Lymphedema, not elsewhere classified: Secondary | ICD-10-CM

## 2018-06-24 DIAGNOSIS — I480 Paroxysmal atrial fibrillation: Secondary | ICD-10-CM

## 2018-06-24 DIAGNOSIS — Z7901 Long term (current) use of anticoagulants: Secondary | ICD-10-CM | POA: Insufficient documentation

## 2018-06-24 DIAGNOSIS — Z4889 Encounter for other specified surgical aftercare: Secondary | ICD-10-CM | POA: Diagnosis present

## 2018-06-24 DIAGNOSIS — E119 Type 2 diabetes mellitus without complications: Secondary | ICD-10-CM | POA: Insufficient documentation

## 2018-06-24 DIAGNOSIS — F419 Anxiety disorder, unspecified: Secondary | ICD-10-CM | POA: Diagnosis not present

## 2018-06-24 MED ORDER — CARVEDILOL 6.25 MG PO TABS: 6.2500 mg | ORAL_TABLET | Freq: Two times a day (BID) | ORAL | 3 refills | Status: DC

## 2018-06-24 NOTE — ED Triage Notes (Signed)
Pt reports she had ileostomy placed in July and has not had issues until today, pt has had pain at site and decrease in bag contents. No redness noted, bag in empty and clean.

## 2018-06-24 NOTE — Patient Instructions (Addendum)
Continue weighing daily and call for an overnight weight gain of > 2 pounds or a weekly weight gain of >5 pounds.  Stop metoprolol and begin carvedilol 6.25mg  twice daily

## 2018-06-27 IMAGING — DX DG CHEST 1V PORT
1 series · 1 of 1 positions shown · non-contrast
Comparison: Chest radiograph July 30, 2016

CLINICAL DATA: Syncopal episode this morning. History of LEFT
breast cancer.

EXAM:
PORTABLE CHEST 1 VIEW

[chest ap]
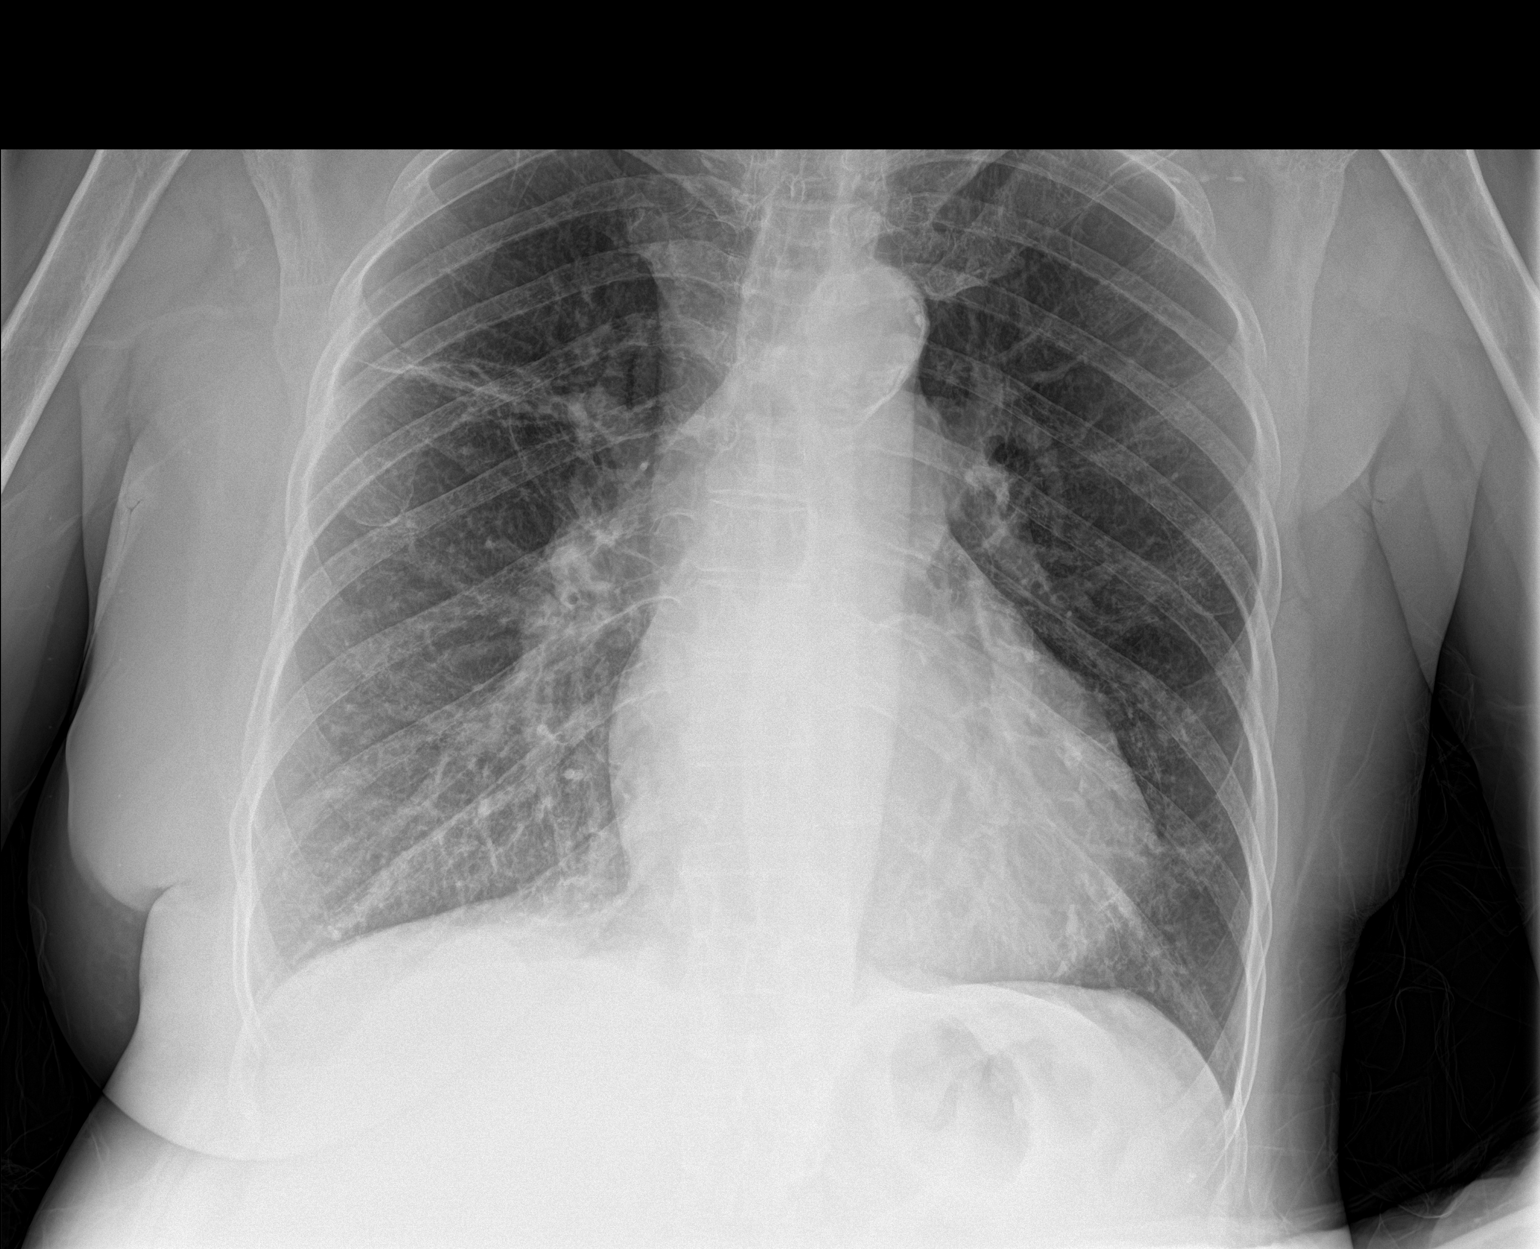

[1 of 1 positions shown; findings below may reference images not displayed]

FINDINGS: Cardiac silhouette is upper limits of normal, unchanged. Calcified
aortic knob. Scarring RIGHT upper lobe. Similar chronic interstitial
changes increased lung volumes without pleural effusion or focal
consolidation. No pneumothorax. LEFT mastectomy. Osseous structures
are nonsuspicious.
IMPRESSION: Stable examination: Borderline cardiomegaly and COPD with RIGHT
upper lobe scarring.

## 2018-06-27 IMAGING — CR DG WRIST COMPLETE 3+V*L*
1 series · 4 of 4 positions shown · non-contrast
Comparison: None.

CLINICAL DATA: 60-year-old female status post fall and left wrist
pain.

EXAM:
LEFT WRIST - COMPLETE 3+ VIEW

[Series 1: dg wrist complete left · 0.14mm/px · 4 of 4 slices shown]
[im 1/4]
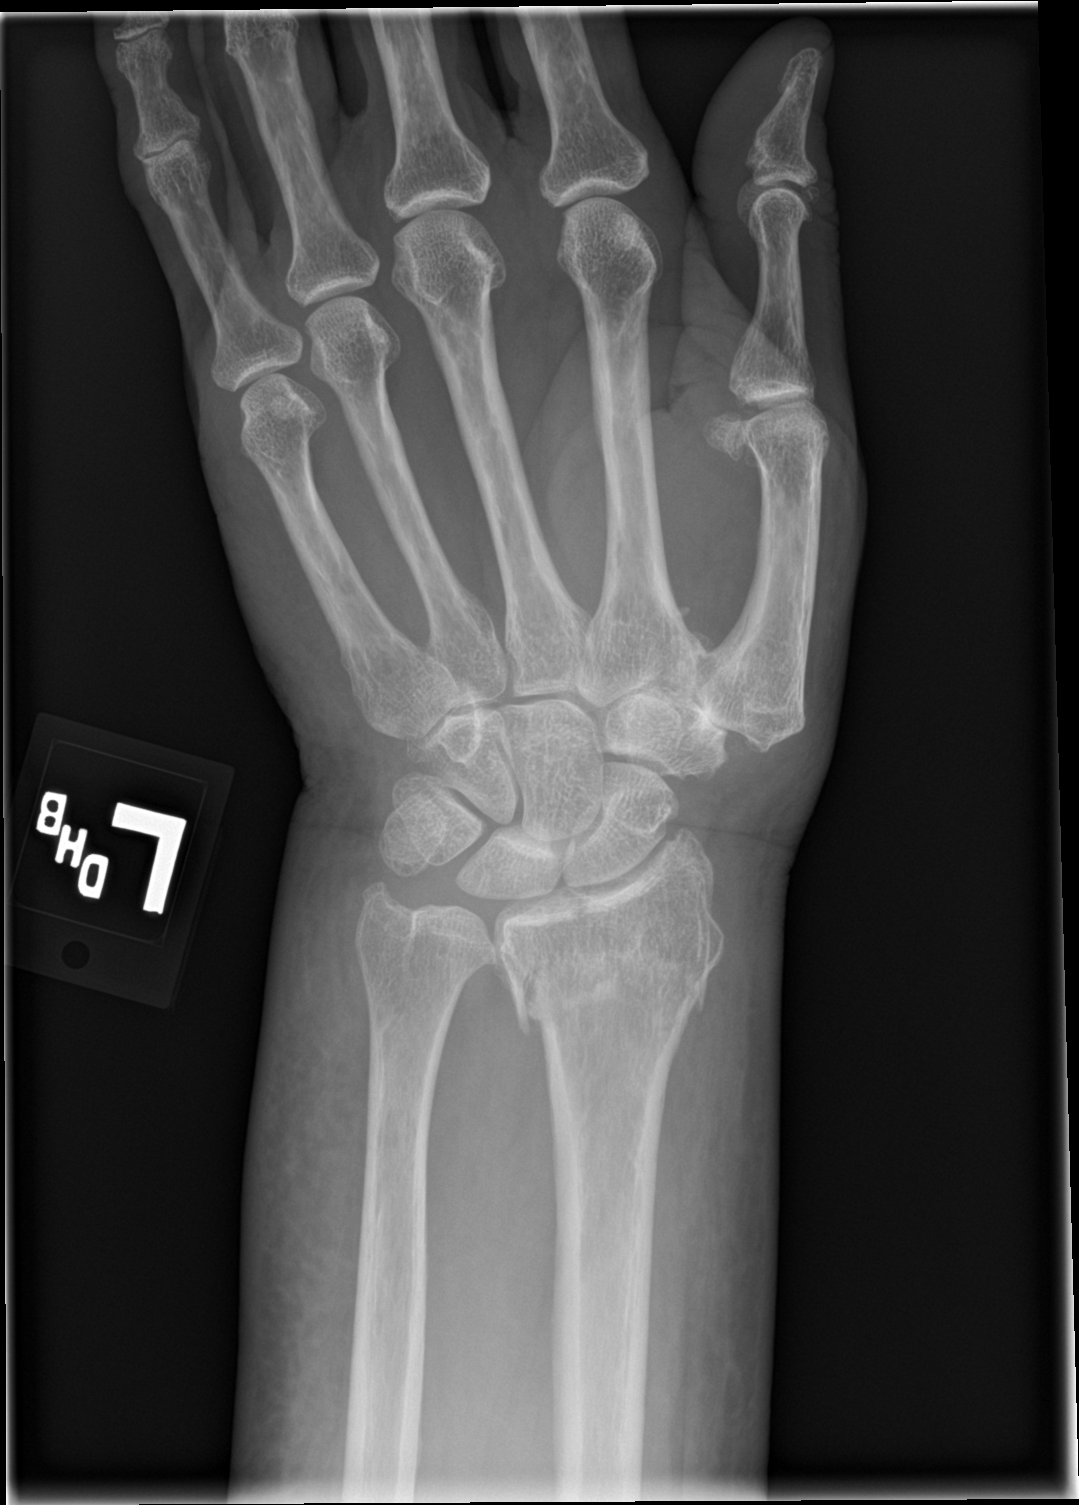
[im 2/4]
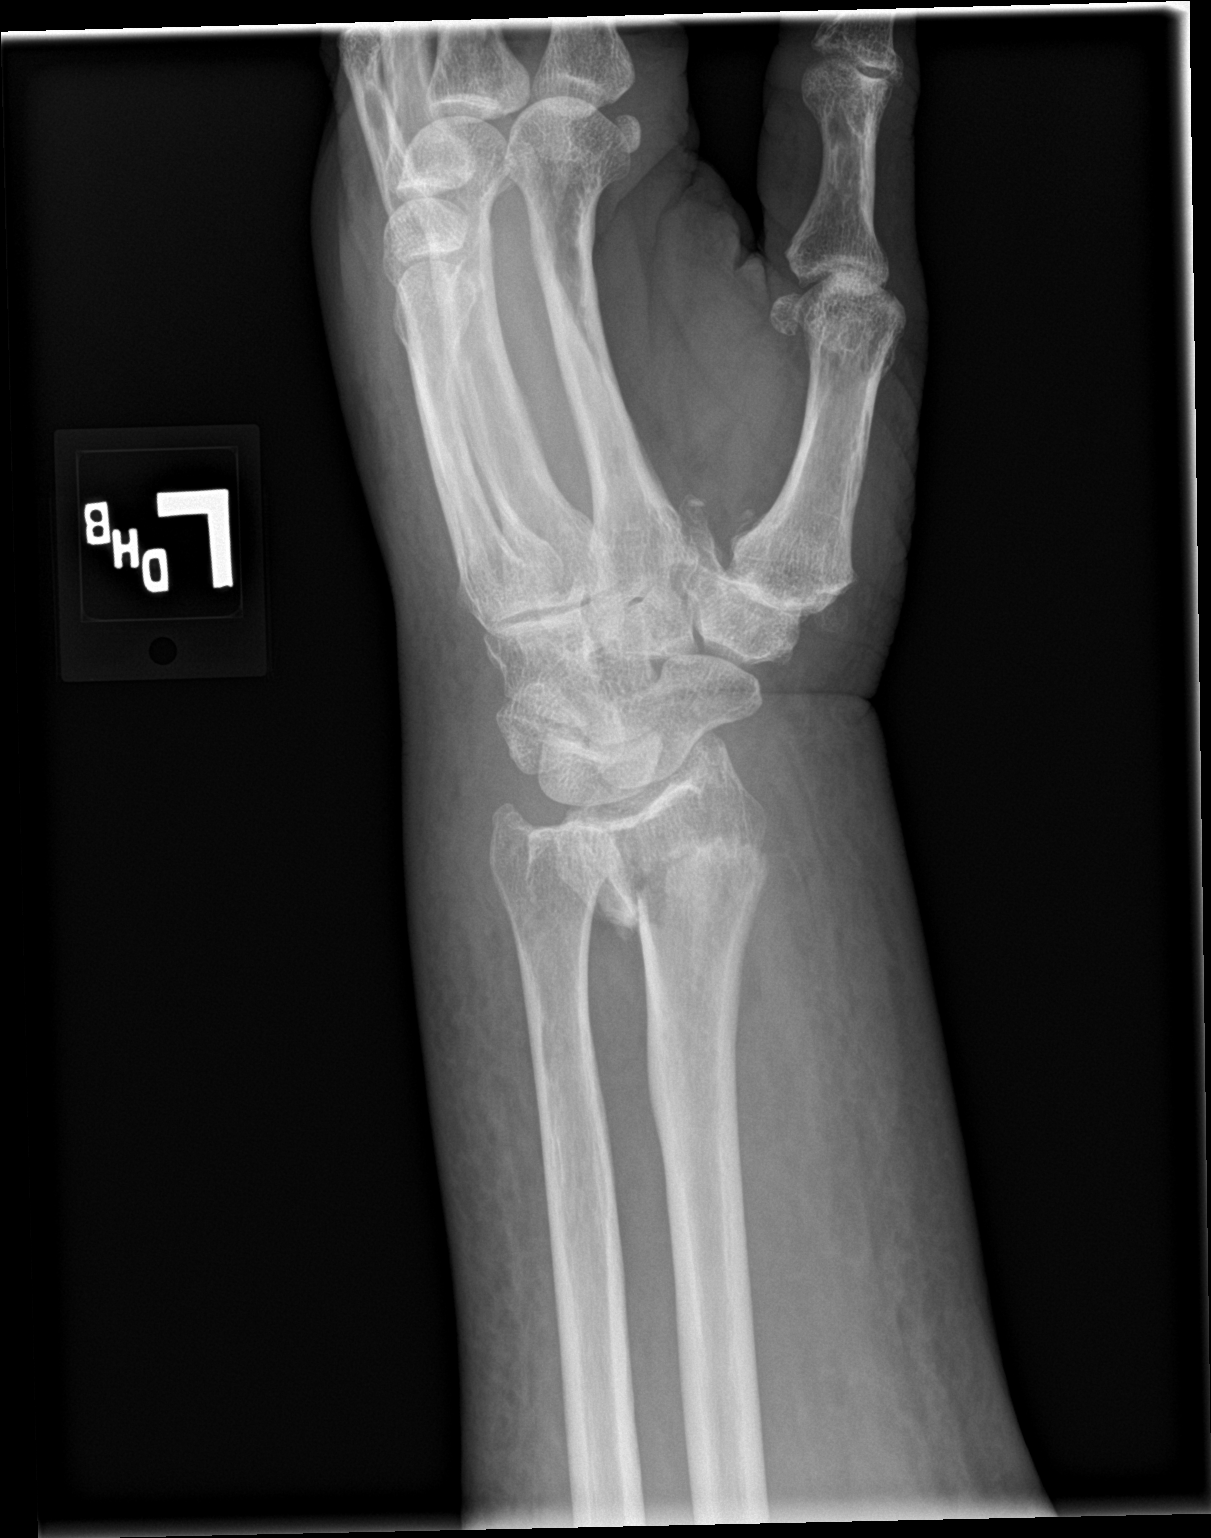
[im 3/4]
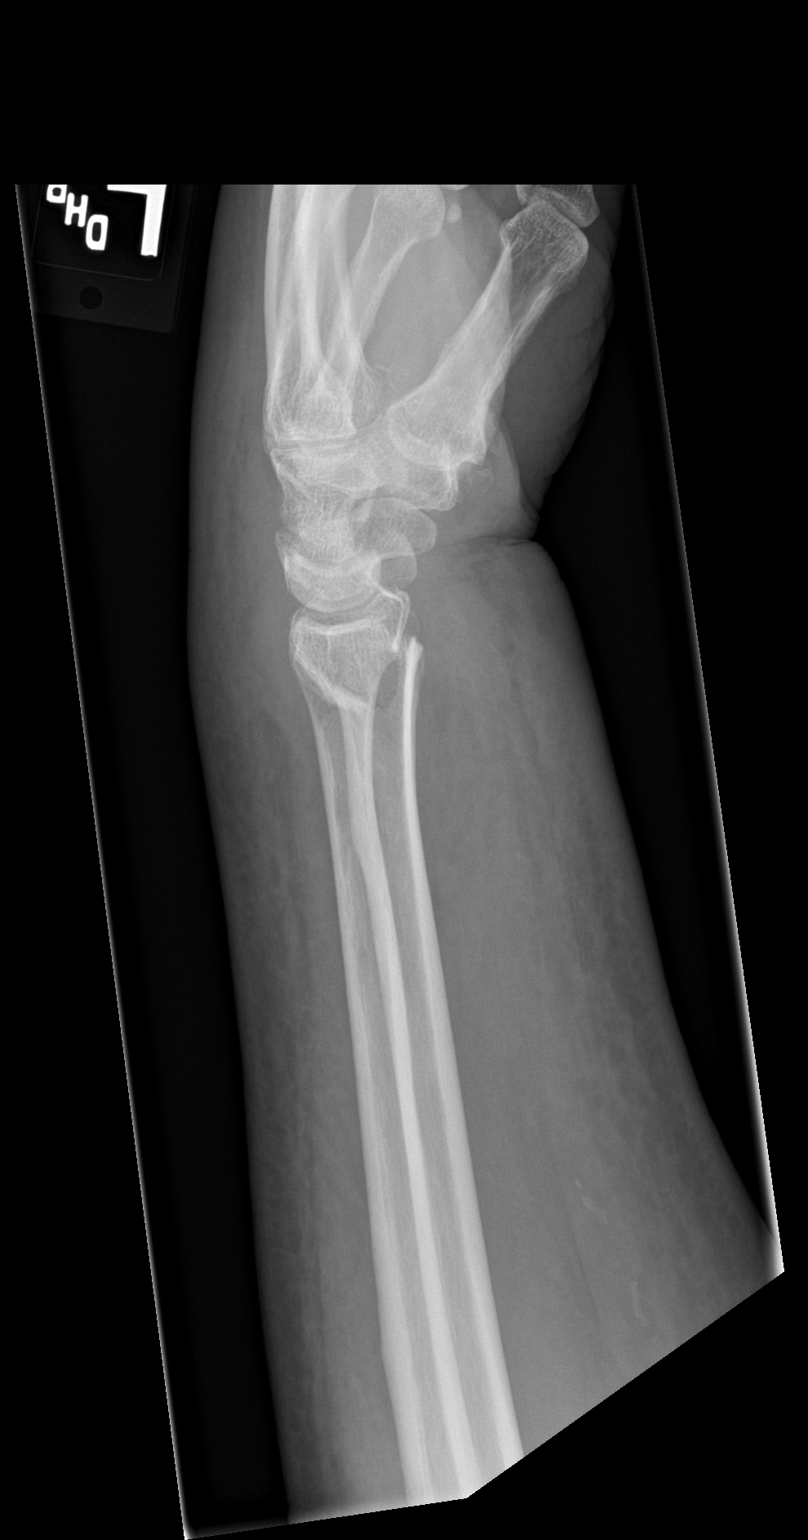
[im 4/4]
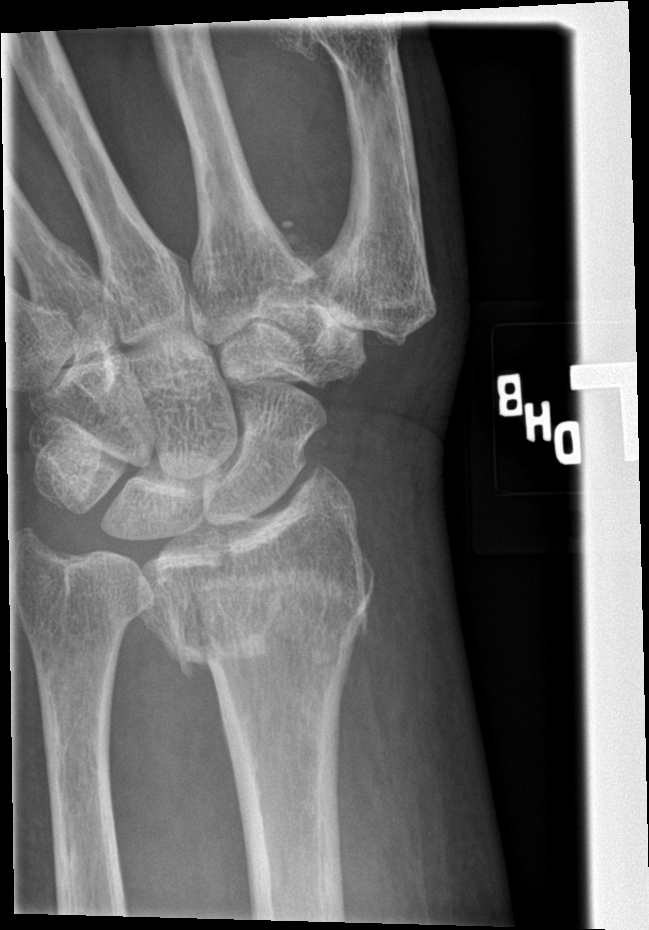

[4 of 4 positions shown; findings below may reference images not displayed]

FINDINGS: There is a comminuted fracture of the distal radius with a
transverse component and a longitudinal component extending to the
articular surface of the distal radius. There is mild dorsal
angulation of the distal fracture fragment. No other acute fracture
identified. There is no dislocation. The bones are osteopenic. There
is osteoarthritic changes of the base of the thumb. There is diffuse
soft tissue swelling of the wrist. No radiopaque foreign object or
soft tissue gas.
IMPRESSION: Comminuted interarticular fracture of the distal radius with mild
dorsal angulation of the distal fracture fragment. No dislocation.

## 2018-06-29 ENCOUNTER — Emergency Department: Payer: Medicaid Other

## 2018-06-29 ENCOUNTER — Inpatient Hospital Stay
Admission: EM | Admit: 2018-06-29 | Discharge: 2018-07-02 | DRG: 389 | Disposition: A | Discharge status: Home Health | Payer: Medicaid Other | Attending: Internal Medicine | Admitting: Internal Medicine

## 2018-06-29 ENCOUNTER — Other Ambulatory Visit: Payer: Self-pay

## 2018-06-29 DIAGNOSIS — Z932 Ileostomy status: Secondary | ICD-10-CM

## 2018-06-29 DIAGNOSIS — K56609 Unspecified intestinal obstruction, unspecified as to partial versus complete obstruction: Secondary | ICD-10-CM | POA: Diagnosis present

## 2018-06-29 DIAGNOSIS — I480 Paroxysmal atrial fibrillation: Secondary | ICD-10-CM | POA: Diagnosis present

## 2018-06-29 DIAGNOSIS — Z66 Do not resuscitate: Secondary | ICD-10-CM | POA: Diagnosis present

## 2018-06-29 DIAGNOSIS — Z9049 Acquired absence of other specified parts of digestive tract: Secondary | ICD-10-CM

## 2018-06-29 DIAGNOSIS — Z853 Personal history of malignant neoplasm of breast: Secondary | ICD-10-CM

## 2018-06-29 DIAGNOSIS — J449 Chronic obstructive pulmonary disease, unspecified: Secondary | ICD-10-CM | POA: Diagnosis present

## 2018-06-29 DIAGNOSIS — Z923 Personal history of irradiation: Secondary | ICD-10-CM | POA: Diagnosis not present

## 2018-06-29 DIAGNOSIS — I5022 Chronic systolic (congestive) heart failure: Secondary | ICD-10-CM | POA: Diagnosis present

## 2018-06-29 DIAGNOSIS — Z7951 Long term (current) use of inhaled steroids: Secondary | ICD-10-CM | POA: Diagnosis not present

## 2018-06-29 DIAGNOSIS — Z85118 Personal history of other malignant neoplasm of bronchus and lung: Secondary | ICD-10-CM | POA: Diagnosis not present

## 2018-06-29 DIAGNOSIS — F329 Major depressive disorder, single episode, unspecified: Secondary | ICD-10-CM | POA: Diagnosis present

## 2018-06-29 DIAGNOSIS — Z9221 Personal history of antineoplastic chemotherapy: Secondary | ICD-10-CM | POA: Diagnosis not present

## 2018-06-29 DIAGNOSIS — Z9012 Acquired absence of left breast and nipple: Secondary | ICD-10-CM | POA: Diagnosis not present

## 2018-06-29 DIAGNOSIS — N179 Acute kidney failure, unspecified: Secondary | ICD-10-CM | POA: Diagnosis present

## 2018-06-29 DIAGNOSIS — Z7901 Long term (current) use of anticoagulants: Secondary | ICD-10-CM | POA: Diagnosis not present

## 2018-06-29 DIAGNOSIS — F419 Anxiety disorder, unspecified: Secondary | ICD-10-CM | POA: Diagnosis present

## 2018-06-29 DIAGNOSIS — E119 Type 2 diabetes mellitus without complications: Secondary | ICD-10-CM | POA: Diagnosis present

## 2018-06-29 DIAGNOSIS — Z803 Family history of malignant neoplasm of breast: Secondary | ICD-10-CM | POA: Diagnosis not present

## 2018-06-29 DIAGNOSIS — R011 Cardiac murmur, unspecified: Secondary | ICD-10-CM | POA: Diagnosis present

## 2018-06-29 DIAGNOSIS — Z79899 Other long term (current) drug therapy: Secondary | ICD-10-CM

## 2018-06-29 DIAGNOSIS — Z87891 Personal history of nicotine dependence: Secondary | ICD-10-CM | POA: Diagnosis not present

## 2018-06-29 DIAGNOSIS — R109 Unspecified abdominal pain: Secondary | ICD-10-CM

## 2018-06-29 DIAGNOSIS — K5669 Other partial intestinal obstruction: Principal | ICD-10-CM | POA: Diagnosis present

## 2018-06-29 DIAGNOSIS — I110 Hypertensive heart disease with heart failure: Secondary | ICD-10-CM | POA: Diagnosis present

## 2018-06-29 DIAGNOSIS — Z9981 Dependence on supplemental oxygen: Secondary | ICD-10-CM | POA: Diagnosis not present

## 2018-06-29 LAB — CBC
HEMATOCRIT: 32.5 % — AB (ref 35.0–47.0)
Hemoglobin: 10.7 g/dL — ABNORMAL LOW (ref 12.0–16.0)
MCH: 30.7 pg (ref 26.0–34.0)
MCHC: 32.9 g/dL (ref 32.0–36.0)
MCV: 93.5 fL (ref 80.0–100.0)
PLATELETS: 394 10*3/uL (ref 150–440)
RBC: 3.47 MIL/uL — AB (ref 3.80–5.20)
RDW: 16.7 % — AB (ref 11.5–14.5)
WBC: 12.7 10*3/uL — AB (ref 3.6–11.0)

## 2018-06-29 LAB — URINALYSIS, COMPLETE (UACMP) WITH MICROSCOPIC
BILIRUBIN URINE: NEGATIVE
Bacteria, UA: NONE SEEN
Glucose, UA: NEGATIVE mg/dL
Hgb urine dipstick: NEGATIVE
Ketones, ur: NEGATIVE mg/dL
NITRITE: NEGATIVE
PH: 5 (ref 5.0–8.0)
Protein, ur: 30 mg/dL — AB
SPECIFIC GRAVITY, URINE: 1.024 (ref 1.005–1.030)

## 2018-06-29 LAB — COMPREHENSIVE METABOLIC PANEL
ALBUMIN: 4.3 g/dL (ref 3.5–5.0)
ALK PHOS: 78 U/L (ref 38–126)
ALT: 12 U/L (ref 0–44)
AST: 20 U/L (ref 15–41)
Anion gap: 6 (ref 5–15)
BILIRUBIN TOTAL: 0.8 mg/dL (ref 0.3–1.2)
BUN: 25 mg/dL — AB (ref 8–23)
CALCIUM: 9.9 mg/dL (ref 8.9–10.3)
CO2: 14 mmol/L — AB (ref 22–32)
CREATININE: 1.5 mg/dL — AB (ref 0.44–1.00)
Chloride: 111 mmol/L (ref 98–111)
GFR calc Af Amer: 41 mL/min — ABNORMAL LOW (ref >60)
GFR calc non Af Amer: 36 mL/min — ABNORMAL LOW (ref >60)
GLUCOSE: 85 mg/dL (ref 70–99)
Potassium: 5 mmol/L (ref 3.5–5.1)
SODIUM: 131 mmol/L — AB (ref 135–145)
TOTAL PROTEIN: 8.1 g/dL (ref 6.5–8.1)

## 2018-06-29 LAB — LIPASE, BLOOD: Lipase: 45 U/L (ref 11–51)

## 2018-06-29 MED ORDER — IOHEXOL 300 MG/ML  SOLN
75.0000 mL | Freq: Once | INTRAMUSCULAR | Status: AC | PRN
  Administered 2018-06-29: 75 mL via INTRAVENOUS

## 2018-06-29 NOTE — ED Notes (Signed)
Patient having x-ray to confirm NG placement.

## 2018-06-29 NOTE — ED Notes (Signed)
Returned from CT.

## 2018-06-29 NOTE — ED Provider Notes (Signed)
[facility] Emergency Department Provider Note  ____________________________________________   I have reviewed the triage vital signs and the nursing notes.   HISTORY  Chief Complaint Abdominal Pain   History limited by: Not Limited   HPI Danielle [name] is a 62 y.o. female who presents to the emergency department today because of concern for abdominal pain. The patient states that it started this morning. Describes it as sharp. Located in her central abdomen. The patient states that she has also noticed decreased output in her ostomy bag. That started today. She states she has had similar symptoms a couple of weeks ago, where the pain resolved once she started having output again. She has had some nausea. Denies any fevers.    Per medical record review patient has a history of right colectomy and left ostomy.  Past Medical History:  Diagnosis Date  . Anxiety   . Arthritis   . Cancer ([place]) left    breast cancer 2000, chemo tx's with total mastectomy and lymph nodes resected.   . Cancer of right lung ([place]) 02/21/2016   rad tx's.   . CHF (congestive heart failure) ([place])   . COPD (chronic obstructive pulmonary disease) ([place])   . Dependence on supplemental oxygen   . Depression   . Diabetes mellitus without complication ([place])   . Heart murmur   . Hypertension   . Lung nodule   . Lymphedema   . Shortness of breath dyspnea    with exertion  . Status post chemotherapy 2001   left breast cancer  . Status post radiation therapy 2001   left breast cancer    Patient Active Problem List   Diagnosis Date Noted  . Chronic systolic heart failure ([place]) 06/09/2018  . HTN (hypertension) 06/09/2018  . Atrial fibrillation ([place]) 06/09/2018  . Lymphedema 06/09/2018  . ARF (acute renal failure) ([place]) 06/04/2018  . Protein-calorie malnutrition, severe 06/04/2018  . Blood in stool   . Focal (segmental) acute (reversible) ischemia of large intestine ([place])   .  Ulceration of intestine   . Abdominal pain, right upper quadrant   . Diverticulosis of large intestine without diverticulitis   . Abnormal CT scan, colon   . Generalized abdominal pain   . Colitis   . COPD exacerbation ([place])   . Malnutrition of moderate degree 04/23/2018  . Palliative care by specialist   . DNR (do not resuscitate) discussion   . Weakness generalized   . Hyponatremia 02/10/2017  . Syncope 02/09/2017  . Lung nodule, solitary 07/25/2016  . Malignant neoplasm of upper lobe of right lung ([place]) 07/25/2016  . Carcinoma of overlapping sites of left breast in female, estrogen receptor positive ([place]) 06/22/2016  . Multiple lung nodules 06/22/2016  . Cough   . Lesion of right lung   . Breast cancer in female [facility]) 02/21/2016  . DOE (dyspnea on exertion) 02/14/2016  . Moderate COPD (chronic obstructive pulmonary disease) ([place]) 08/24/2014    Past Surgical History:  Procedure Laterality Date  . Breast Biospy Left    [facility]  . BREAST SURGERY    . COLONOSCOPY N/A 04/30/2018   Procedure: COLONOSCOPY;  Surgeon: Virgel Manifold, MD;  Location: [facility] ENDOSCOPY;  Service: Endoscopy;  Laterality: N/A;  . DILATION AND CURETTAGE OF UTERUS    . ELECTROMAGNETIC NAVIGATION BROCHOSCOPY Right 04/11/2016   Procedure: ELECTROMAGNETIC NAVIGATION BRONCHOSCOPY;  Surgeon: Vilinda Boehringer, MD;  Location: [facility] ORS;  Service: Cardiopulmonary;  Laterality: Right;  . ESOPHAGOGASTRODUODENOSCOPY (EGD) WITH PROPOFOL N/A 05/07/2018  Procedure: ESOPHAGOGASTRODUODENOSCOPY (EGD) WITH PROPOFOL;  Surgeon: Lucilla Lame, MD;  Location: [facility] ENDOSCOPY;  Service: Endoscopy;  Laterality: N/A;  . history of colonoscopy]    . ILEOSTOMY    . LAPAROTOMY Right 05/04/2018   Procedure: EXPLORATORY LAPAROTOMY right colectomy right and left ostomy;  Surgeon: Herbert Pun, MD;  Location: [facility] ORS;  Service: General;  Laterality: Right;  . LUNG BIOPSY    . MASTECTOMY Left    2000, [facility]  . ROTATOR CUFF REPAIR Right     [facility]    Prior to Admission medications   Medication Sig Start Date End Date Taking? Authorizing Provider  albuterol (PROVENTIL HFA;VENTOLIN HFA) 108 (90 Base) MCG/ACT inhaler Inhale 2 puffs into the lungs every 6 (six) hours as needed for wheezing or shortness of breath. 04/24/18   Demetrios Loll, MD  apixaban (ELIQUIS) 5 MG TABS tablet Take 1 tablet (5 mg total) by mouth 2 (two) times daily. Patient not taking: Reported on 06/24/2018 06/23/18   [name], MD  budesonide-formoterol [facility]) 160-4.5 MCG/ACT inhaler Inhale 2 puffs into the lungs 2 (two) times daily. 04/24/18   Demetrios Loll, MD  carvedilol (COREG) 6.25 MG tablet Take 1 tablet (6.25 mg total) by mouth 2 (two) times daily. 06/24/18   [name], FNP  citalopram (CELEXA) 40 MG tablet Take 40 mg by mouth daily.    [provider]  furosemide (LASIX) 20 MG tablet Take 20 mg by mouth daily.     [provider]  HYDROcodone-acetaminophen (NORCO/VICODIN) 5-325 MG tablet Take 1 tablet by mouth every 6 (six) hours as needed for moderate pain or severe pain.    [provider]  megestrol (MEGACE) 400 MG/10ML suspension Take 7.5 mLs (300 mg total) by mouth 2 (two) times daily. 06/05/18   Fritzi Mandes, MD  Multiple Vitamin (MULTIVITAMIN WITH MINERALS) TABS tablet Take 1 tablet by mouth daily. 06/06/18   Fritzi Mandes, MD    Allergies Patient has no known allergies.  Family History  Problem Relation Age of Onset  . Breast cancer Mother 22  . Cancer Mother        Breast   . Cirrhosis Father   . Breast cancer Paternal Aunt 40  . Cancer Maternal Aunt        Breast     Social History Social History   Tobacco Use  . Smoking status: Former Smoker    Packs/day: 0.50    Years: 20.00    Pack years: 10.00    Types: Cigarettes    Last attempt to quit: 07/02/2012    Years since quitting: 5.9  . Smokeless tobacco: Never Used  . Tobacco comment: quit 2014  Substance Use Topics  . Alcohol use: Yes     Comment: Occasionally  . Drug use: No    Review of Systems Constitutional: No fever/chills Eyes: No visual changes. ENT: No sore throat. Cardiovascular: Denies chest pain. Respiratory: Denies shortness of breath. Gastrointestinal: Positive for abdominal pain, decreased ostomy output.  Genitourinary: Negative for dysuria. Musculoskeletal: Negative for back pain. Skin: Negative for rash. Neurological: Negative for headaches, focal weakness or numbness.  ____________________________________________   PHYSICAL EXAM:  VITAL SIGNS: ED Triage Vitals  Enc Vitals Group     BP 06/29/18 1843 (!) 150/110     Pulse Rate 06/29/18 1843 (!) 103     Resp 06/29/18 1843 18     Temp 06/29/18 1843 98 F (36.7 C)     Temp Source 06/29/18 1843 Oral  SpO2 06/29/18 1843 98 %     Weight 06/29/18 1844 126 lb (57.2 kg)     Height 06/29/18 1844 5\' 6"  (1.676 m)     Head Circumference --      Peak Flow --      Pain Score 06/29/18 1844 10   Constitutional: Alert and oriented.  Eyes: Conjunctivae are normal.  ENT      Head: Normocephalic and atraumatic.      Nose: No congestion/rhinnorhea.      Mouth/Throat: Mucous membranes are moist.      Neck: No stridor. Hematological/Lymphatic/Immunilogical: No cervical lymphadenopathy. Cardiovascular: Normal rate, regular rhythm.  No murmurs, rubs, or gallops.  Respiratory: Normal respiratory effort without tachypnea nor retractions. Breath sounds are clear and equal bilaterally. No wheezes/rales/rhonchi. Gastrointestinal: Soft and somewhat diffusely tender to palpation.  Genitourinary: Deferred Musculoskeletal: Normal range of motion in all extremities. No lower extremity edema. Neurologic:  Normal speech and language. No gross focal neurologic deficits are appreciated.  Skin:  Skin is warm, dry and intact. No rash noted. Psychiatric: Mood and affect are normal. Speech and behavior are normal. Patient exhibits appropriate insight and  judgment.  ____________________________________________    LABS (pertinent positives/negatives)  CBC wbc 12.7, hgb 10.7, plt 394 Lipase 45 CMP na 131, k 5.0, glu 85, cr 1.50 UA trace leukocytes, 6-10 wbc ____________________________________________   EKG  None  ____________________________________________    RADIOLOGY  Abd w/chest Concern for SBO  CT abd/pel SBO, concern for closed loop segment  ____________________________________________   PROCEDURES  Procedures  ____________________________________________   INITIAL IMPRESSION / ASSESSMENT AND PLAN / ED COURSE  Pertinent labs & imaging results that were available during my care of the patient were reviewed by me and considered in my medical decision making (see chart for details).   Patient presented to the emergency department today with concerns for abdominal pain decreased ostomy output and nausea.  On exam patient was tender to palpation about her stomach.  Initial x-rays were performed which were concerning for possible small bowel obstruction.  CT scan was performed which was consistent with a small bowel obstruction.  Discussed with surgery.  Will plan on admission to the hospitalist service.  Nasogastric tube was placed.  Discussed findings plan with patient.   ____________________________________________   FINAL CLINICAL IMPRESSION(S) / ED DIAGNOSES  Final diagnoses:  Abdominal pain, unspecified abdominal location  Small bowel obstruction [facility])     Note: This dictation was prepared with Dragon dictation. Any transcriptional errors that result from this process are unintentional     Nance Pear, MD 06/29/18 2234

## 2018-06-29 NOTE — ED Notes (Signed)
Dr. Archie Balboa in with patient.

## 2018-06-29 NOTE — ED Notes (Signed)
Attempted to reposition NG tube by pulling tube out approximately 9-10 cm to attempt to straighten loop x 2, but unsuccessful.  NG tube removed.  Patient refusing to have NG replaced at this time.  ED provider notified, will notify admitting MD.

## 2018-06-29 NOTE — ED Triage Notes (Signed)
Pt arrived via ems for report of abd pain around her umbilicus - the pain started this am - she reports nausea but denies vomiting - she denies chest pain or shortness of breath

## 2018-06-29 NOTE — ED Notes (Signed)
14 Fr NG placed down right nare.  Patient tolerated well.  X-ray to confirm placement ordered.

## 2018-06-29 NOTE — H&P (Signed)
[facility] [place] [facility] [place] NAME: Danielle [name]    MR#:  [id number]  DATE OF BIRTH:  1956/06/16  DATE OF ADMISSION:  06/29/2018  PRIMARY CARE PHYSICIAN: Elisabeth Cara, NP   REQUESTING/REFERRING PHYSICIAN:   CHIEF COMPLAINT:   Chief Complaint  Patient presents with  . Abdominal Pain    HISTORY OF PRESENT ILLNESS: Danielle [name]  is a 62 y.o. female with a known history of COPD, CHF, remote breast and lung malignancies, status post treatment.  Most recently, approximately 2 months ago patient underwent right hemicolectomy with ileostomy placement, due to ischemic colitis. Patient presented to emergency room this time for abdominal pain and nausea, going on for the past 24 hours, gradually getting worse.  She also noticed decreased output in her ostomy bag in the past 24 hours.  The abdominal pain is described as 6 out of 10 constant, sharp pain located in the mid abdominal area, without any radiation.  Patient denies any vomiting, no fever, no chills. Blood test done emergency room are notable for creatinine level of 1.5, sodium level 131, WBC at 12.7. CT scan of the abdomen shows  fairly long segment fluid-filled and dilated loop of small bowel beginning at the transition within the pelvis and remaining dilated to the level of the ileostomy within the right anterior abdomen. Patient is admitted for further evaluation and treatment.  PAST MEDICAL HISTORY:   Past Medical History:  Diagnosis Date  . Anxiety   . Arthritis   . Cancer ([place]) left    breast cancer 2000, chemo tx's with total mastectomy and lymph nodes resected.   . Cancer of right lung ([place]) 02/21/2016   rad tx's.   . CHF (congestive heart failure) ([place])   . COPD (chronic obstructive pulmonary disease) ([place])   . Dependence on supplemental oxygen   . Depression   . Diabetes mellitus without complication ([place])   . Heart murmur   . Hypertension   . Lung nodule   . Lymphedema   .  Shortness of breath dyspnea    with exertion  . Status post chemotherapy 2001   left breast cancer  . Status post radiation therapy 2001   left breast cancer    PAST SURGICAL HISTORY:  Past Surgical History:  Procedure Laterality Date  . Breast Biospy Left    [facility]  . BREAST SURGERY    . COLONOSCOPY N/A 04/30/2018   Procedure: COLONOSCOPY;  Surgeon: Virgel Manifold, MD;  Location: [facility] ENDOSCOPY;  Service: Endoscopy;  Laterality: N/A;  . DILATION AND CURETTAGE OF UTERUS    . ELECTROMAGNETIC NAVIGATION BROCHOSCOPY Right 04/11/2016   Procedure: ELECTROMAGNETIC NAVIGATION BRONCHOSCOPY;  Surgeon: Vilinda Boehringer, MD;  Location: [facility] ORS;  Service: Cardiopulmonary;  Laterality: Right;  . ESOPHAGOGASTRODUODENOSCOPY (EGD) WITH PROPOFOL N/A 05/07/2018   Procedure: ESOPHAGOGASTRODUODENOSCOPY (EGD) WITH PROPOFOL;  Surgeon: Lucilla Lame, MD;  Location: [facility] ENDOSCOPY;  Service: Endoscopy;  Laterality: N/A;  . history of colonoscopy]    . ILEOSTOMY    . LAPAROTOMY Right 05/04/2018   Procedure: EXPLORATORY LAPAROTOMY right colectomy right and left ostomy;  Surgeon: Herbert Pun, MD;  Location: [facility] ORS;  Service: General;  Laterality: Right;  . LUNG BIOPSY    . MASTECTOMY Left    2000, [facility]  . ROTATOR CUFF REPAIR Right    [facility]    SOCIAL HISTORY:  Social History   Tobacco Use  . Smoking status: Former Smoker    Packs/day: 0.50    Years:  20.00    Pack years: 10.00    Types: Cigarettes    Last attempt to quit: 07/02/2012    Years since quitting: 5.9  . Smokeless tobacco: Never Used  . Tobacco comment: quit 2014  Substance Use Topics  . Alcohol use: Yes    Comment: Occasionally    FAMILY HISTORY:  Family History  Problem Relation Age of Onset  . Breast cancer Mother 50  . Cancer Mother        Breast   . Cirrhosis Father   . Breast cancer Paternal Aunt 31  . Cancer Maternal Aunt        Breast     DRUG ALLERGIES: No Known Allergies  REVIEW OF SYSTEMS:    CONSTITUTIONAL: No fever, fatigue or weakness.  EYES: No changes in vision.  EARS, NOSE, AND THROAT: No tinnitus or ear pain.  RESPIRATORY: No cough, shortness of breath, wheezing or hemoptysis.  CARDIOVASCULAR: No chest pain, orthopnea, edema.  GASTROINTESTINAL: Positive for central abdominal pain and nausea, no vomiting, diarrhea.  Decreased ostomy output. GENITOURINARY: No dysuria, hematuria.  ENDOCRINE: No polyuria, nocturia. HEMATOLOGY: No bleeding. SKIN: No rash or lesion. MUSCULOSKELETAL: No joint pain at this time.   NEUROLOGIC: No focal weakness.  PSYCHIATRY: No anxiety or depression.   MEDICATIONS AT HOME:  Prior to Admission medications   Medication Sig Start Date End Date Taking? Authorizing Provider  albuterol (PROVENTIL HFA;VENTOLIN HFA) 108 (90 Base) MCG/ACT inhaler Inhale 2 puffs into the lungs every 6 (six) hours as needed for wheezing or shortness of breath. 04/24/18  Yes Demetrios Loll, MD  apixaban (ELIQUIS) 5 MG TABS tablet Take 1 tablet (5 mg total) by mouth 2 (two) times daily. 06/23/18  Yes [name], MD  budesonide-formoterol (SYMBICORT) 160-4.5 MCG/ACT inhaler Inhale 2 puffs into the lungs 2 (two) times daily. 04/24/18  Yes Demetrios Loll, MD  carvedilol (COREG) 6.25 MG tablet Take 1 tablet (6.25 mg total) by mouth 2 (two) times daily. 06/24/18  Yes Hackney, Tina A, FNP  citalopram (CELEXA) 40 MG tablet Take 40 mg by mouth daily.   Yes [provider]  furosemide (LASIX) 20 MG tablet Take 20 mg by mouth daily.    Yes [provider]  HYDROcodone-acetaminophen (NORCO/VICODIN) 5-325 MG tablet Take 1 tablet by mouth every 6 (six) hours as needed for moderate pain or severe pain.   Yes [provider]  megestrol (MEGACE) 400 MG/10ML suspension Take 7.5 mLs (300 mg total) by mouth 2 (two) times daily. 06/05/18  Yes Fritzi Mandes, MD  Multiple Vitamin (MULTIVITAMIN WITH MINERALS) TABS tablet Take 1 tablet by mouth daily. 06/06/18  Yes Fritzi Mandes, MD      PHYSICAL EXAMINATION:   VITAL SIGNS: Blood pressure (!) 123/97, pulse (!) 125, temperature 98 F (36.7 C), temperature source Oral, resp. rate 18, height 5\' 6"  (1.676 m), weight 57.2 kg, SpO2 98 %.  GENERAL:  62 y.o.-year-old patient lying in the bed with no acute distress.  EYES: Pupils equal, round, reactive to light and accommodation. No scleral icterus. Extraocular muscles intact.  HEENT: Head atraumatic, normocephalic. Oropharynx and nasopharynx clear.  NECK:  Supple, no jugular venous distention. No thyroid enlargement, no tenderness.  LUNGS: Normal breath sounds bilaterally, no wheezing, rales,rhonchi or crepitation. No use of accessory muscles of respiration.  CARDIOVASCULAR: S1, S2 normal. No S3/S4.  ABDOMEN: Abdomen is diffusely tender with palpation, especially in the central area.  Otherwise, abdomen is soft, nondistended. Bowel sounds diminished.  Ileostomy in place,  bag is empty. EXTREMITIES: No pedal edema, cyanosis, or clubbing.  NEUROLOGIC: No focal weakness. PSYCHIATRIC: The patient is alert and oriented x 3.  SKIN: No obvious rash, lesion, or ulcer.   LABORATORY PANEL:   CBC Recent Labs  Lab 06/29/18 1854  WBC 12.7*  HGB 10.7*  HCT 32.5*  PLT 394  MCV 93.5  MCH 30.7  MCHC 32.9  RDW 16.7*   ------------------------------------------------------------------------------------------------------------------  Chemistries  Recent Labs  Lab 06/29/18 1930  NA 131*  K 5.0  CL 111  CO2 14*  GLUCOSE 85  BUN 25*  CREATININE 1.50*  CALCIUM 9.9  AST 20  ALT 12  ALKPHOS 78  BILITOT 0.8   ------------------------------------------------------------------------------------------------------------------ estimated creatinine clearance is 34.2 mL/min (A) (by C-G formula based on SCr of 1.5 mg/dL (H)). ------------------------------------------------------------------------------------------------------------------ No results for input(s): TSH,  T4TOTAL, T3FREE, THYROIDAB in the last 72 hours.  Invalid input(s): FREET3   Coagulation profile No results for input(s): INR, PROTIME in the last 168 hours. ------------------------------------------------------------------------------------------------------------------- No results for input(s): DDIMER in the last 72 hours. -------------------------------------------------------------------------------------------------------------------  Cardiac Enzymes No results for input(s): CKMB, TROPONINI, MYOGLOBIN in the last 168 hours.  Invalid input(s): CK ------------------------------------------------------------------------------------------------------------------ Invalid input(s): POCBNP  ---------------------------------------------------------------------------------------------------------------  Urinalysis    Component Value Date/Time   COLORURINE YELLOW (A) 06/29/2018 1854   APPEARANCEUR HAZY (A) 06/29/2018 1854   APPEARANCEUR Clear 12/06/2013 2245   LABSPEC 1.024 06/29/2018 1854   LABSPEC 1.015 12/06/2013 2245   PHURINE 5.0 06/29/2018 1854   GLUCOSEU NEGATIVE 06/29/2018 1854   GLUCOSEU Negative 12/06/2013 2245   HGBUR NEGATIVE 06/29/2018 1854   BILIRUBINUR NEGATIVE 06/29/2018 1854   BILIRUBINUR Negative 12/06/2013 2245   KETONESUR NEGATIVE 06/29/2018 1854   PROTEINUR 30 (A) 06/29/2018 1854   NITRITE NEGATIVE 06/29/2018 1854   LEUKOCYTESUR TRACE (A) 06/29/2018 1854   LEUKOCYTESUR Negative 12/06/2013 2245     RADIOLOGY: Dg Abdomen 1 View  Result Date: 06/29/2018 CLINICAL DATA:  Evaluate NG tube placement. EXAM: ABDOMEN - 1 VIEW COMPARISON:  Chest radiograph 06/29/2018 FINDINGS: Enteric tube is coiled within the esophagus. Multiple gaseous distended loops of bowel within the upper abdomen. IMPRESSION: Enteric tube coiled within the esophagus. Recommend repeat positioning. These results will be called to the ordering clinician or representative by the Radiologist  Assistant, and communication documented in the PACS or zVision Dashboard. Electronically Signed   By: Lovey Newcomer M.D.   On: 06/29/2018 22:59   Ct Abdomen Pelvis W Contrast  Result Date: 06/29/2018 CLINICAL DATA:  Patient status post hemicolectomy for ischemic colitis. History of lung cancer and breast cancer. EXAM: CT ABDOMEN AND PELVIS WITH CONTRAST TECHNIQUE: Multidetector CT imaging of the abdomen and pelvis was performed using the standard protocol following bolus administration of intravenous contrast. CONTRAST:  40mL OMNIPAQUE IOHEXOL 300 MG/ML  SOLN COMPARISON:  Abdominal series earlier same day; PET/CT 06/20/2018; CT abdomen pelvis 05/11/2018 FINDINGS: Lower chest: Normal heart size. Lung bases are clear. No pleural effusion. Hepatobiliary: Liver is normal in size and contour. Unchanged subcentimeter too small to characterize low-attenuation lesion hepatic dome (image 10; series 2). Gallbladder is unremarkable. Pancreas: Unremarkable Spleen: Unremarkable Adrenals/Urinary Tract: Adrenal glands are normal. Kidneys enhance symmetrically with contrast. Unchanged cyst superior pole left kidney. Urinary bladder is decompressed. Stomach/Bowel: The stomach is fluid-filled and distended. The mid and distal small bowel is dilated and fluid-filled to the level of the right lower quadrant ileostomy. Small bowel loops measure up to 3.3 cm. Fecalization of the contents within the distal small bowel. Additionally, there is  abrupt transition between decompressed mid small bowel and dilated mid to distal small bowel within the pelvis (image 53; series 5). 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 Vascular/Lymphatic: Extensive peripheral calcified atherosclerotic plaque involving the abdominal aorta. No retroperitoneal lymphadenopathy. Reproductive: Uterus and adnexal structures unremarkable. Other: Moderate volume free fluid in the pelvis. Musculoskeletal: Midline laparotomy incision. Lumbar spine  degenerative changes. No aggressive or acute appearing osseous lesions. IMPRESSION: 1. Fairly long segment fluid-filled and dilated loop of small bowel beginning at the transition within the pelvis and remaining dilated to the level of the ileostomy within the right anterior abdomen. Findings are concerning for obstruction. As there appears to be transition at both the proximal and distal aspect of the dilated distal small bowel, the possibility of a long segment closed loop obstruction is not entirely excluded. Moderate volume free fluid within the pelvis. Electronically Signed   By: Lovey Newcomer M.D.   On: 06/29/2018 20:59   Dg Abdomen Acute W/chest  Result Date: 06/29/2018 CLINICAL DATA:  Abdominal pain periumbilical beginning this morning. Nausea and vomiting. Right lung cancer. EXAM: DG ABDOMEN ACUTE W/ 1V CHEST COMPARISON:  Chest x-ray 05/15/2018 and KUB 05/10/2018 FINDINGS: Lungs are adequately inflated with right upper lobe/perihilar scarring. Cardiomediastinal silhouette and remainder of the chest is unchanged. Abdominopelvic images demonstrate several air-filled dilated central small bowel loops measuring up to 3.4 cm in diameter. No free peritoneal air. Paucity of bowel gas within the colon. Remainder of the exam is unchanged. IMPRESSION: Several air-filled dilated small bowel loops with paucity of gas within the colon. Findings likely due to early/partial small bowel obstruction and less likely ileus. No acute cardiopulmonary disease. Right upper lobe/perihilar scarring. Electronically Signed   By: Marin Olp M.D.   On: 06/29/2018 20:11    EKG: Orders placed or performed during the hospital encounter of 04/21/18  . ED EKG  . ED EKG  . EKG 12-Lead  . EKG 12-Lead    IMPRESSION AND PLAN:  1.  Small bowel obstruction.  We will place an NG tube, will keep patient n.p.o. and consult general surgery for further evaluation and treatment. 2.  Acute renal failure likely prerenal, due to poor  p.o. Intake.  We will start gentle IV hydration and monitor kidney function closely.  Avoid nephrotoxic medications. 3.  CHF, currently clinically compensated, continue to monitor closely.  Continue Coreg. 4.  COPD, without acute exacerbation.  Continue maintenance therapy. 5.  Paroxysmal atrial fibrillation, currently with sinus tachycardia, heart rate 101.  Continue to monitor on telemetry.  Continue Coreg for rate control and Eliquis for anticoagulation.  6.  Remote history of breast and lung malignancies, status post treatment, currently in remission.  Continue follow-up and management, per oncology.  All the records are reviewed and case discussed with ED provider. Management plans discussed with the patient, who is in agreement.  CODE STATUS: Code Status History    Date Active Date Inactive Code Status Order ID Comments User Context   06/04/2018 0113 06/05/2018 1448 Full Code [id number]  Amelia Jo, MD Inpatient   04/21/2018 [phone] 05/17/2018 2214 Full Code [id number]  Arta Silence, MD Inpatient   02/09/2017 1125 02/11/2017 1706 Full Code [id number]  Henreitta Leber, MD Inpatient       TOTAL TIME TAKING CARE OF THIS PATIENT: 50 minutes.    Amelia Jo M.D on 06/30/2018 at 12:00 AM  Between 7am to 6pm - Pager - [phone]  After 6pm go to [email or web address] - [place]  [place] [facility]  [phone]  CC: Primary care physician; Elisabeth Cara, NP

## 2018-06-29 NOTE — ED Notes (Signed)
Patient transported to CT 

## 2018-06-30 ENCOUNTER — Other Ambulatory Visit: Payer: Self-pay

## 2018-06-30 ENCOUNTER — Inpatient Hospital Stay: Payer: Medicaid Other

## 2018-06-30 LAB — CBC
HCT: 27.7 % — ABNORMAL LOW (ref 35.0–47.0)
Hemoglobin: 9.2 g/dL — ABNORMAL LOW (ref 12.0–16.0)
MCH: 31.5 pg (ref 26.0–34.0)
MCHC: 33.1 g/dL (ref 32.0–36.0)
MCV: 95.3 fL (ref 80.0–100.0)
Platelets: 317 10*3/uL (ref 150–440)
RBC: 2.91 MIL/uL — ABNORMAL LOW (ref 3.80–5.20)
RDW: 16.4 % — AB (ref 11.5–14.5)
WBC: 8.7 10*3/uL (ref 3.6–11.0)

## 2018-06-30 LAB — BASIC METABOLIC PANEL
ANION GAP: 6 (ref 5–15)
BUN: 21 mg/dL (ref 8–23)
CALCIUM: 8.7 mg/dL — AB (ref 8.9–10.3)
CO2: 12 mmol/L — ABNORMAL LOW (ref 22–32)
CREATININE: 1.3 mg/dL — AB (ref 0.44–1.00)
Chloride: 115 mmol/L — ABNORMAL HIGH (ref 98–111)
GFR calc Af Amer: 49 mL/min — ABNORMAL LOW (ref >60)
GFR calc non Af Amer: 42 mL/min — ABNORMAL LOW (ref >60)
GLUCOSE: 82 mg/dL (ref 70–99)
Potassium: 4.1 mmol/L (ref 3.5–5.1)
Sodium: 133 mmol/L — ABNORMAL LOW (ref 135–145)

## 2018-06-30 LAB — GLUCOSE, CAPILLARY: GLUCOSE-CAPILLARY: 89 mg/dL (ref 70–99)

## 2018-06-30 MED ORDER — CITALOPRAM HYDROBROMIDE 20 MG PO TABS
40.0000 mg | ORAL_TABLET | Freq: Every day | ORAL | Status: DC
  Administered 2018-06-30 – 2018-07-02 (×3): 40 mg via ORAL
  Filled 2018-06-30 (×4): qty 2

## 2018-06-30 MED ORDER — TRAZODONE HCL 50 MG PO TABS
25.0000 mg | ORAL_TABLET | Freq: Every evening | ORAL | Status: DC | PRN
  Administered 2018-06-30 (×2): 25 mg via ORAL
  Filled 2018-06-30 (×2): qty 1

## 2018-06-30 MED ORDER — ADULT MULTIVITAMIN W/MINERALS CH
1.0000 | ORAL_TABLET | Freq: Every day | ORAL | Status: DC
  Administered 2018-06-30 – 2018-07-02 (×3): 1 via ORAL
  Filled 2018-06-30 (×3): qty 1

## 2018-06-30 MED ORDER — SODIUM CHLORIDE 0.9 % IV SOLN
INTRAVENOUS | Status: DC
  Administered 2018-06-30 – 2018-07-01 (×3): via INTRAVENOUS

## 2018-06-30 MED ORDER — ONDANSETRON HCL 4 MG/2ML IJ SOLN: 4.0000 mg | Freq: Four times a day (QID) | INTRAMUSCULAR | Status: DC | PRN

## 2018-06-30 MED ORDER — ONDANSETRON HCL 4 MG PO TABS: 4.0000 mg | ORAL_TABLET | Freq: Four times a day (QID) | ORAL | Status: DC | PRN

## 2018-06-30 MED ORDER — APIXABAN 5 MG PO TABS
5.0000 mg | ORAL_TABLET | Freq: Two times a day (BID) | ORAL | Status: DC
  Administered 2018-06-30 – 2018-07-02 (×6): 5 mg via ORAL
  Filled 2018-06-30 (×5): qty 1

## 2018-06-30 MED ORDER — BISACODYL 5 MG PO TBEC: 5.0000 mg | DELAYED_RELEASE_TABLET | Freq: Every day | ORAL | Status: DC | PRN

## 2018-06-30 MED ORDER — APIXABAN 5 MG PO TABS
5.0000 mg | ORAL_TABLET | Freq: Two times a day (BID) | ORAL | Status: DC
  Filled 2018-06-30: qty 1

## 2018-06-30 MED ORDER — ALBUTEROL SULFATE (2.5 MG/3ML) 0.083% IN NEBU: 2.5000 mg | INHALATION_SOLUTION | Freq: Four times a day (QID) | RESPIRATORY_TRACT | Status: DC | PRN

## 2018-06-30 MED ORDER — CARVEDILOL 6.25 MG PO TABS
6.2500 mg | ORAL_TABLET | Freq: Two times a day (BID) | ORAL | Status: DC
  Administered 2018-06-30 – 2018-07-02 (×5): 6.25 mg via ORAL
  Filled 2018-06-30 (×4): qty 1

## 2018-06-30 MED ORDER — ACETAMINOPHEN 650 MG RE SUPP: 650.0000 mg | Freq: Four times a day (QID) | RECTAL | Status: DC | PRN

## 2018-06-30 MED ORDER — CARVEDILOL 6.25 MG PO TABS
6.2500 mg | ORAL_TABLET | Freq: Two times a day (BID) | ORAL | Status: DC
  Filled 2018-06-30: qty 1

## 2018-06-30 MED ORDER — DOCUSATE SODIUM 100 MG PO CAPS
100.0000 mg | ORAL_CAPSULE | Freq: Two times a day (BID) | ORAL | Status: DC
  Administered 2018-07-01 – 2018-07-02 (×3): 100 mg via ORAL
  Filled 2018-06-30 (×5): qty 1

## 2018-06-30 MED ORDER — HYDROCODONE-ACETAMINOPHEN 5-325 MG PO TABS
1.0000 | ORAL_TABLET | ORAL | Status: DC | PRN
  Administered 2018-06-30 – 2018-07-01 (×4): 1 via ORAL
  Filled 2018-06-30 (×4): qty 1

## 2018-06-30 MED ORDER — ACETAMINOPHEN 325 MG PO TABS
650.0000 mg | ORAL_TABLET | Freq: Four times a day (QID) | ORAL | Status: DC | PRN
  Administered 2018-07-01: 650 mg via ORAL
  Filled 2018-06-30: qty 2

## 2018-06-30 MED ORDER — FLUTICASONE FUROATE-VILANTEROL 200-25 MCG/INH IN AEPB
1.0000 | INHALATION_SPRAY | Freq: Every day | RESPIRATORY_TRACT | Status: DC
  Administered 2018-06-30 – 2018-07-02 (×3): 1 via RESPIRATORY_TRACT
  Filled 2018-06-30: qty 28

## 2018-06-30 MED ORDER — SODIUM CHLORIDE 0.9 % IV SOLN
Freq: Once | INTRAVENOUS | Status: AC
  Administered 2018-06-30: 01:00:00 via INTRAVENOUS

## 2018-06-30 NOTE — Progress Notes (Signed)
   06/30/18 1100  Clinical Encounter Type  Visited With Patient  Visit Type Initial (Advance Directives Education)  Referral From Physician  Recommendations Follow-up to complete.  Spiritual Encounters  Spiritual Needs Emotional   Chaplain provided AD education and facilitated a discussion about the patient's EOL wishes.  Chaplain offered a Palliative Care consult, but patient indicated her readiness to complete the AD today.  Chaplain led the patient through the completion of [employer] A [employer].  Further, Chaplain arranged for a notary at 1300 to complete documentation.  Secondarily, patient does not remember giving consent to her surgery. Her daughter assured the patient that she did provide consent. Chaplain discussed how one's memory is affected by anesthesiology. A conversation by her nurse or doctor on post-surgical memory issues may help assure the patient.

## 2018-06-30 NOTE — Progress Notes (Signed)
   06/30/18 1300  Clinical Encounter Type  Visited With Patient;Family;Other (Comment) (Notary & Witnesses)  Visit Type Follow-up  Recommendations Follow-up as requested.  Spiritual Encounters  Spiritual Needs Other (Comment) (Completion of Advance Directives)  Stress Factors  Patient Stress Factors Health changes  Advance Directives (For Healthcare)  Does Patient Have a Medical Advance Directive? Yes  Type of Paramedic of [place];Living will  Copy of [place] in Chart? Yes  Copy of Living Will in Chart? Yes  Mental Health Advance Directives  Does Patient Have a Mental Health Advance Directive? Yes (Component of AD)   Chaplain returned as requested to complete the AD for the patient. Chaplain recruited notary and witnesses to complete the documentation and witness the patient's signature. After completion, the original was given to the patient and a copy was given to the unit secretary for inclusion in the patient's chart. The OR was closed.

## 2018-06-30 NOTE — Progress Notes (Signed)
BP low.  MD Vachhanin notified.  Order received to hold pm coreg and continue NS at 35ml/hr

## 2018-06-30 NOTE — ED Notes (Signed)
Dr. Duane Boston notified of patient's refusal to have NG placed.

## 2018-06-30 NOTE — Consult Note (Signed)
[place] Nurse ostomy consult note Stoma type/location: RLQ ileostomy with LLQ mucus fistula.  Pouch changed last night due to overfilling and loss of seal and is currently intact.   Stomal assessment/size: 3/4" ileostomy Output Large brown soft effluent Ostomy pouching: 2pc. 2 3/4" pouch Education provided: none Enrolled patient in [employer] program: N/A Woc team will follow.  Domenic Moras MSN, RN, FNP-BC CWON Wound, Ostomy, Continence Nurse Pager [phone]

## 2018-06-30 NOTE — Progress Notes (Signed)
Pt refused NGT placement. Stated she will rather want it under sedation. Tomorrow. Explained to patient the complication of not having the NGT place tonight. She said" I am very aware of it, I am not doing that again tonight, the experience in Ed was too painful".

## 2018-06-30 NOTE — Progress Notes (Signed)
Surgical Consultation  06/30/2018  Danielle [name] is an 62 y.o. female.   Referring Physician: Dr Duane Boston  CC:SBO  HPI: This patient with a history of a right hemicolectomy with ileostomy and mucous fistula for bleeding from the right colon.  She presented to the emergency room today with nausea and vomiting and abdominal pain.  She also had low ostomy output.  Been going on for 1 day.  When I saw the patient on the floor after admitting from the ER she states that she had refused the nasogastric tube after attempted been made to place it.  And at that time her pain went away when she started having high outputs from her ileostomy.  She states in fact that the bag was emptied twice.  Her pain is completely gone at this point.  She is no longer nauseated.  Past Medical History:  Diagnosis Date  . Anxiety   . Arthritis   . Cancer ([place]) left    breast cancer 2000, chemo tx's with total mastectomy and lymph nodes resected.   . Cancer of right lung ([place]) 02/21/2016   rad tx's.   . CHF (congestive heart failure) ([place])   . COPD (chronic obstructive pulmonary disease) ([place])   . Dependence on supplemental oxygen   . Depression   . Diabetes mellitus without complication ([place])   . Heart murmur   . Hypertension   . Lung nodule   . Lymphedema   . Shortness of breath dyspnea    with exertion  . Status post chemotherapy 2001   left breast cancer  . Status post radiation therapy 2001   left breast cancer    Past Surgical History:  Procedure Laterality Date  . Breast Biospy Left    [facility]  . BREAST SURGERY    . COLONOSCOPY N/A 04/30/2018   Procedure: COLONOSCOPY;  Surgeon: Virgel Manifold, MD;  Location: [facility] ENDOSCOPY;  Service: Endoscopy;  Laterality: N/A;  . DILATION AND CURETTAGE OF UTERUS    . ELECTROMAGNETIC NAVIGATION BROCHOSCOPY Right 04/11/2016   Procedure: ELECTROMAGNETIC NAVIGATION BRONCHOSCOPY;  Surgeon: Vilinda Boehringer, MD;  Location: [facility] ORS;  Service: Cardiopulmonary;   Laterality: Right;  . ESOPHAGOGASTRODUODENOSCOPY (EGD) WITH PROPOFOL N/A 05/07/2018   Procedure: ESOPHAGOGASTRODUODENOSCOPY (EGD) WITH PROPOFOL;  Surgeon: Lucilla Lame, MD;  Location: [facility] ENDOSCOPY;  Service: Endoscopy;  Laterality: N/A;  . history of colonoscopy]    . ILEOSTOMY    . LAPAROTOMY Right 05/04/2018   Procedure: EXPLORATORY LAPAROTOMY right colectomy right and left ostomy;  Surgeon: Herbert Pun, MD;  Location: [facility] ORS;  Service: General;  Laterality: Right;  . LUNG BIOPSY    . MASTECTOMY Left    2000, [facility]  . ROTATOR CUFF REPAIR Right    [facility]    Family History  Problem Relation Age of Onset  . Breast cancer Mother 70  . Cancer Mother        Breast   . Cirrhosis Father   . Breast cancer Paternal Aunt 54  . Cancer Maternal Aunt        Breast     Social History:  reports that she quit smoking about 5 years ago. Her smoking use included cigarettes. She has a 10.00 pack-year smoking history. She has never used smokeless tobacco. She reports that she drinks alcohol. She reports that she does not use drugs.  Allergies: No Known Allergies  Medications reviewed.   Review of Systems:   Review of Systems  Constitutional: Negative.   HENT: Negative.   Eyes:  Negative.   Respiratory: Negative.   Cardiovascular: Negative.   Gastrointestinal: Positive for abdominal pain, nausea and vomiting. Negative for heartburn.  Genitourinary: Negative.   Musculoskeletal: Negative.   Skin: Negative.   Neurological: Negative.   Endo/Heme/Allergies: Negative.   Psychiatric/Behavioral: Negative.      Physical Exam:  BP 115/66 (BP Location: Right Arm)   Pulse 100   Temp 97.7 F (36.5 C) (Oral)   Resp 20   Ht 5' 6" (1.676 m)   Wt 57.2 kg   SpO2 99%   BMI 20.34 kg/m   Physical Exam  Constitutional: She appears well-developed and well-nourished.  Appears quite comfortable  Eyes: Pupils are equal, round, and reactive to light. EOM are normal.  Cardiovascular:  Normal rate and regular rhythm.  Pulmonary/Chest: Effort normal. No respiratory distress.  Abdominal: Soft. Normal appearance. She exhibits no distension.  Output in ileostomy bag.  Abdomen is soft nondistended nontympanitic and nontender  Skin: Skin is warm and dry.  Vitals reviewed.     Results for orders placed or performed during the hospital encounter of 06/29/18 (from the past 48 hour(s))  CBC     Status: Abnormal   Collection Time: 06/29/18  6:54 PM  Result Value Ref Range   WBC 12.7 (H) 3.6 - 11.0 K/uL   RBC 3.47 (L) 3.80 - 5.20 MIL/uL   Hemoglobin 10.7 (L) 12.0 - 16.0 g/dL   HCT 32.5 (L) 35.0 - 47.0 %   MCV 93.5 80.0 - 100.0 fL   MCH 30.7 26.0 - 34.0 pg   MCHC 32.9 32.0 - 36.0 g/dL   RDW 16.7 (H) 11.5 - 14.5 %   Platelets 394 150 - 440 K/uL    Comment: Performed at [facility], [address] [place], [place] [address]  Urinalysis, Complete w Microscopic     Status: Abnormal   Collection Time: 06/29/18  6:54 PM  Result Value Ref Range   Color, Urine YELLOW (A) YELLOW   APPearance HAZY (A) CLEAR   Specific Gravity, Urine 1.024 1.005 - 1.030   pH 5.0 5.0 - 8.0   Glucose, UA NEGATIVE NEGATIVE mg/dL   Hgb urine dipstick NEGATIVE NEGATIVE   Bilirubin Urine NEGATIVE NEGATIVE   Ketones, ur NEGATIVE NEGATIVE mg/dL   Protein, ur 30 (A) NEGATIVE mg/dL   Nitrite NEGATIVE NEGATIVE   Leukocytes, UA TRACE (A) NEGATIVE   RBC / HPF 0-5 0 - 5 RBC/hpf   WBC, UA 6-10 0 - 5 WBC/hpf   Bacteria, UA NONE SEEN NONE SEEN   Squamous Epithelial / LPF 0-5 0 - 5   Mucus PRESENT    Hyaline Casts, UA PRESENT     Comment: Performed at [facility], [address] [place],  [address]  Comprehensive metabolic panel     Status: Abnormal   Collection Time: 06/29/18  7:30 PM  Result Value Ref Range   Sodium 131 (L) 135 - 145 mmol/L   Potassium 5.0 3.5 - 5.1 mmol/L   Chloride 111 98 - 111 mmol/L   CO2 14 (L) 22 - 32 mmol/L   Glucose, Bld 85 70 - 99 mg/dL   BUN 25  (H) 8 - 23 mg/dL   Creatinine, Ser 1.50 (H) 0.44 - 1.00 mg/dL   Calcium 9.9 8.9 - 10.3 mg/dL   Total Protein 8.1 6.5 - 8.1 g/dL   Albumin 4.3 3.5 - 5.0 g/dL   AST 20 15 - 41 U/L   ALT 12 0 - 44 U/L  Alkaline Phosphatase 78 38 - 126 U/L   Total Bilirubin 0.8 0.3 - 1.2 mg/dL   GFR calc non Af Amer 36 (L) >60 mL/min   GFR calc Af Amer 41 (L) >60 mL/min    Comment: (NOTE) The eGFR has been calculated using the CKD EPI equation. This calculation has not been validated in all clinical situations. eGFR's persistently <60 mL/min signify possible Chronic Kidney Disease.    Anion gap 6 5 - 15    Comment: Performed at [facility], [address] [place], [place] [address]  Lipase, blood     Status: None   Collection Time: 06/29/18  7:30 PM  Result Value Ref Range   Lipase 45 11 - 51 U/L    Comment: Performed at [facility], [address] [place], [place] [address]   Dg Abdomen 1 View  Result Date: 06/29/2018 CLINICAL DATA:  Evaluate NG tube placement. EXAM: ABDOMEN - 1 VIEW COMPARISON:  Chest radiograph 06/29/2018 FINDINGS: Enteric tube is coiled within the esophagus. Multiple gaseous distended loops of bowel within the upper abdomen. IMPRESSION: Enteric tube coiled within the esophagus. Recommend repeat positioning. These results will be called to the ordering clinician or representative by the Radiologist Assistant, and communication documented in the PACS or zVision Dashboard. Electronically Signed   By: Lovey Newcomer M.D.   On: 06/29/2018 22:59   Ct Abdomen Pelvis W Contrast  Result Date: 06/29/2018 CLINICAL DATA:  Patient status post hemicolectomy for ischemic colitis. History of lung cancer and breast cancer. EXAM: CT ABDOMEN AND PELVIS WITH CONTRAST TECHNIQUE: Multidetector CT imaging of the abdomen and pelvis was performed using the standard protocol following bolus administration of intravenous contrast. CONTRAST:  45m OMNIPAQUE IOHEXOL 300 MG/ML  SOLN  COMPARISON:  Abdominal series earlier same day; PET/CT 06/20/2018; CT abdomen pelvis 05/11/2018 FINDINGS: Lower chest: Normal heart size. Lung bases are clear. No pleural effusion. Hepatobiliary: Liver is normal in size and contour. Unchanged subcentimeter too small to characterize low-attenuation lesion hepatic dome (image 10; series 2). Gallbladder is unremarkable. Pancreas: Unremarkable Spleen: Unremarkable Adrenals/Urinary Tract: Adrenal glands are normal. Kidneys enhance symmetrically with contrast. Unchanged cyst superior pole left kidney. Urinary bladder is decompressed. Stomach/Bowel: The stomach is fluid-filled and distended. The mid and distal small bowel is dilated and fluid-filled to the level of the right lower quadrant ileostomy. Small bowel loops measure up to 3.3 cm. Fecalization of the contents within the distal small bowel. Additionally, there is abrupt transition between decompressed mid small bowel and dilated mid to distal small bowel within the pelvis (image 53; series 5). 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 Vascular/Lymphatic: Extensive peripheral calcified atherosclerotic plaque involving the abdominal aorta. No retroperitoneal lymphadenopathy. Reproductive: Uterus and adnexal structures unremarkable. Other: Moderate volume free fluid in the pelvis. Musculoskeletal: Midline laparotomy incision. Lumbar spine degenerative changes. No aggressive or acute appearing osseous lesions. IMPRESSION: 1. Fairly long segment fluid-filled and dilated loop of small bowel beginning at the transition within the pelvis and remaining dilated to the level of the ileostomy within the right anterior abdomen. Findings are concerning for obstruction. As there appears to be transition at both the proximal and distal aspect of the dilated distal small bowel, the possibility of a long segment closed loop obstruction is not entirely excluded. Moderate volume free fluid within the pelvis.  Electronically Signed   By: DLovey NewcomerM.D.   On: 06/29/2018 20:59   Dg Abdomen Acute W/chest  Result Date: 06/29/2018 CLINICAL DATA:  Abdominal pain periumbilical beginning this morning. Nausea and vomiting. Right lung cancer. EXAM: DG ABDOMEN ACUTE W/ 1V CHEST COMPARISON:  Chest x-ray 05/15/2018 and KUB 05/10/2018 FINDINGS: Lungs are adequately inflated with right upper lobe/perihilar scarring. Cardiomediastinal silhouette and remainder of the chest is unchanged. Abdominopelvic images demonstrate several air-filled dilated central small bowel loops measuring up to 3.4 cm in diameter. No free peritoneal air. Paucity of bowel gas within the colon. Remainder of the exam is unchanged. IMPRESSION: Several air-filled dilated small bowel loops with paucity of gas within the colon. Findings likely due to early/partial small bowel obstruction and less likely ileus. No acute cardiopulmonary disease. Right upper lobe/perihilar scarring. Electronically Signed   By: Marin Olp M.D.   On: 06/29/2018 20:11    Assessment/Plan:  CT scan and labs personally reviewed.  Is a bowel obstruction on CT scan and by history and physical.  However at this point the patient's pain has completely resolved and she has put out a large volume through her ileostomy.  Obviously small bowel obstruction has relieved itself.  I will notify Dr. Windell Moment of the patient's admission in the morning and will take over care as a consultant.  But at this point there is no need for surgical intervention.  Florene Glen, MD, FACS

## 2018-06-30 NOTE — Progress Notes (Signed)
[place] Hospital Day(s): 1.   Post op day(s):  [name]   Interval History: Patient seen and examined, no acute events or new complaints overnight. Patient reports feeling improved pain. Ostomy bag full of air and stool at the moment of evaluation. Patient refers that it was changed last night.   Vital signs in last 24 hours: [min-max] current  Temp:  [97.7 F (36.5 C)-98.9 F (37.2 C)] 98.9 F (37.2 C) (09/16 0533) Pulse Rate:  [34-125] 108 (09/16 0533) Resp:  [16-20] 16 (09/16 0533) BP: (101-150)/(66-110) 101/76 (09/16 0533) SpO2:  [97 %-100 %] 98 % (09/16 0533) Weight:  [54.7 kg-57.2 kg] 54.7 kg (09/16 0025)     Height: 5\' 7"  (170.2 cm) Weight: 54.7 kg BMI (Calculated): 18.88   Intake/Output this shift:  Total I/O In: -  Out: 300 [Stool:300]   Intake/Output last 2 shifts:  @IOLAST2SHIFTS @   Physical Exam:  Constitutional: alert, cooperative and no distress  Respiratory: breathing non-labored at rest  Cardiovascular: regular rate and sinus rhythm  Gastrointestinal: soft, non-tender, and non-distended  Labs:  CBC Latest Ref Rng & Units 06/30/2018 06/29/2018 06/04/2018  WBC 3.6 - 11.0 K/uL 8.7 12.7(H) 7.9  Hemoglobin 12.0 - 16.0 g/dL 9.2(L) 10.7(L) 9.8(L)  Hematocrit 35.0 - 47.0 % 27.7(L) 32.5(L) 29.7(L)  Platelets 150 - 440 K/uL 317 394 276   CMP Latest Ref Rng & Units 06/30/2018 06/29/2018 06/05/2018  Glucose 70 - 99 mg/dL 82 85 96  BUN 8 - 23 mg/dL 21 25(H) 27(H)  Creatinine 0.44 - 1.00 mg/dL 1.30(H) 1.50(H) 1.02(H)  Sodium 135 - 145 mmol/L 133(L) 131(L) 135  Potassium 3.5 - 5.1 mmol/L 4.1 5.0 4.4  Chloride 98 - 111 mmol/L 115(H) 111 119(H)  CO2 22 - 32 mmol/L 12(L) 14(L) 14(L)  Calcium 8.9 - 10.3 mg/dL 8.7(L) 9.9 8.2(L)  Total Protein 6.5 - 8.1 g/dL - 8.1 -  Total Bilirubin 0.3 - 1.2 mg/dL - 0.8 -  Alkaline Phos 38 - 126 U/L - 78 -  AST 15 - 41 U/L - 20 -  ALT 0 - 44 U/L - 12 -    Imaging studies:  EXAM: CT ABDOMEN AND PELVIS WITH  CONTRAST  TECHNIQUE: Multidetector CT imaging of the abdomen and pelvis was performed using the standard protocol following bolus administration of intravenous contrast.  CONTRAST:  57mL OMNIPAQUE IOHEXOL 300 MG/ML  SOLN  COMPARISON:  Abdominal series earlier same day; PET/CT 06/20/2018; CT abdomen pelvis 05/11/2018  FINDINGS: Lower chest: Normal heart size. Lung bases are clear. No pleural effusion.  Hepatobiliary: Liver is normal in size and contour. Unchanged subcentimeter too small to characterize low-attenuation lesion hepatic dome (image 10; series 2). Gallbladder is unremarkable.  Pancreas: Unremarkable  Spleen: Unremarkable  Adrenals/Urinary Tract: Adrenal glands are normal. Kidneys enhance symmetrically with contrast. Unchanged cyst superior pole left kidney. Urinary bladder is decompressed.  Stomach/Bowel: The stomach is fluid-filled and distended. The mid and distal small bowel is dilated and fluid-filled to the level of the right lower quadrant ileostomy. Small bowel loops measure up to 3.3 cm. Fecalization of the contents within the distal small bowel. Additionally, there is abrupt transition between decompressed mid small bowel and dilated mid to distal small bowel within the pelvis (image 53; series 5). 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  Vascular/Lymphatic: Extensive peripheral calcified atherosclerotic plaque involving the abdominal aorta. No retroperitoneal lymphadenopathy.  Reproductive: Uterus and adnexal structures unremarkable.  Other: Moderate volume free fluid in the pelvis.  Musculoskeletal:  Midline laparotomy incision. Lumbar spine degenerative changes. No aggressive or acute appearing osseous lesions.  IMPRESSION: 1. Fairly long segment fluid-filled and dilated loop of small bowel beginning at the transition within the pelvis and remaining dilated to the level of the ileostomy within the right  anterior abdomen. Findings are concerning for obstruction. As there appears to be transition at both the proximal and distal aspect of the dilated distal small bowel, the possibility of a long segment closed loop obstruction is not entirely excluded. Moderate volume free fluid within the pelvis.   Electronically Signed   By: Lovey Newcomer M.D.   On: 06/29/2018 20:59   Assessment/Plan:  63 y.o. female with small bowel obstruction  s/p right hemicolectomy with end ileostomy and mucus fistula for bleeding hepatic flexure, complicated by pertinent comorbidities including CHF, atrial fibrillation on anticoagulation, acute renal failure, COPD on oxygen at home, history of lung and breast cancer. Patient had an intermittent episode of small bowel obstruction that seems to be resolving. Had significant stool and air on the bag. Will order new images to follow up bowel pattern and start with clear liquid to assess toleration.   Arnold Long, MD

## 2018-06-30 NOTE — Progress Notes (Signed)
Family Meeting Note  Advance Directive:yes  Today a meeting took place with the Patient and sister.  The following clinical team members were present during this meeting:MD  The following were discussed:Patient's diagnosis: Small bowel obstruction, hemicolectomy, acute renal failure, Patient's progosis: Unable to determine and Goals for treatment: DNR  Additional follow-up to be provided: Surgical and PMD  Time spent during discussion:20 minutes  [name], MD

## 2018-06-30 NOTE — Progress Notes (Signed)
[place] [facility] [place] NAME: Danielle [name]    MR#:  [id number]  DATE OF BIRTH:  Feb 24, 1956  SUBJECTIVE:  CHIEF COMPLAINT:   Chief Complaint  Patient presents with  . Abdominal Pain   History of hemicolectomy and ileostomy-came with vomiting and pain and noted to have small bowel obstruction. It resolved spontaneously and patient had significant amount of loose stool coming out in her colostomy bag. Feels fine today  REVIEW OF SYSTEMS:  CONSTITUTIONAL: No fever, fatigue or weakness.  EYES: No blurred or double vision.  EARS, NOSE, AND THROAT: No tinnitus or ear pain.  RESPIRATORY: No cough, shortness of breath, wheezing or hemoptysis.  CARDIOVASCULAR: No chest pain, orthopnea, edema.  GASTROINTESTINAL: She had nausea, vomiting, no diarrhea or abdominal pain.  GENITOURINARY: No dysuria, hematuria.  ENDOCRINE: No polyuria, nocturia,  HEMATOLOGY: No anemia, easy bruising or bleeding SKIN: No rash or lesion. MUSCULOSKELETAL: No joint pain or arthritis.   NEUROLOGIC: No tingling, numbness, weakness.  PSYCHIATRY: No anxiety or depression.   ROS  DRUG ALLERGIES:  No Known Allergies  VITALS:  Blood pressure (!) 92/50, pulse 93, temperature 98.6 F (37 C), temperature source Oral, resp. rate 17, height 5\' 7"  (1.702 m), weight 54.7 kg, SpO2 98 %.  PHYSICAL EXAMINATION:  GENERAL:  62 y.o.-year-old patient lying in the bed with no acute distress.  EYES: Pupils equal, round, reactive to light and accommodation. No scleral icterus. Extraocular muscles intact.  HEENT: Head atraumatic, normocephalic. Oropharynx and nasopharynx clear.  NECK:  Supple, no jugular venous distention. No thyroid enlargement, no tenderness.  LUNGS: Normal breath sounds bilaterally, no wheezing, rales,rhonchi or crepitation. No use of accessory muscles of respiration.  CARDIOVASCULAR: S1, S2 normal. No murmurs, rubs, or gallops.  ABDOMEN: Soft, nontender, nondistended.  Bowel sounds present. No organomegaly or mass. Ileostomy bag in place. EXTREMITIES: No pedal edema, cyanosis, or clubbing.  NEUROLOGIC: Cranial nerves II through XII are intact. Muscle strength 5/5 in all extremities. Sensation intact. Gait not checked.  PSYCHIATRIC: The patient is alert and oriented x 3.  SKIN: No obvious rash, lesion, or ulcer.   Physical Exam LABORATORY PANEL:   CBC Recent Labs  Lab 06/30/18 0308  WBC 8.7  HGB 9.2*  HCT 27.7*  PLT 317   ------------------------------------------------------------------------------------------------------------------  Chemistries  Recent Labs  Lab 06/29/18 1930 06/30/18 0308  NA 131* 133*  K 5.0 4.1  CL 111 115*  CO2 14* 12*  GLUCOSE 85 82  BUN 25* 21  CREATININE 1.50* 1.30*  CALCIUM 9.9 8.7*  AST 20  --   ALT 12  --   ALKPHOS 78  --   BILITOT 0.8  --    ------------------------------------------------------------------------------------------------------------------  Cardiac Enzymes No results for input(s): TROPONINI in the last 168 hours. ------------------------------------------------------------------------------------------------------------------  RADIOLOGY:  Dg Abdomen 1 View  Result Date: 06/29/2018 CLINICAL DATA:  Evaluate NG tube placement. EXAM: ABDOMEN - 1 VIEW COMPARISON:  Chest radiograph 06/29/2018 FINDINGS: Enteric tube is coiled within the esophagus. Multiple gaseous distended loops of bowel within the upper abdomen. IMPRESSION: Enteric tube coiled within the esophagus. Recommend repeat positioning. These results will be called to the ordering clinician or representative by the Radiologist Assistant, and communication documented in the PACS or zVision Dashboard. Electronically Signed   By: Lovey Newcomer M.D.   On: 06/29/2018 22:59   Ct Abdomen Pelvis W Contrast  Result Date: 06/29/2018 CLINICAL DATA:  Patient status post hemicolectomy for ischemic colitis. History of lung cancer and breast  cancer.  EXAM: CT ABDOMEN AND PELVIS WITH CONTRAST TECHNIQUE: Multidetector CT imaging of the abdomen and pelvis was performed using the standard protocol following bolus administration of intravenous contrast. CONTRAST:  43mL OMNIPAQUE IOHEXOL 300 MG/ML  SOLN COMPARISON:  Abdominal series earlier same day; PET/CT 06/20/2018; CT abdomen pelvis 05/11/2018 FINDINGS: Lower chest: Normal heart size. Lung bases are clear. No pleural effusion. Hepatobiliary: Liver is normal in size and contour. Unchanged subcentimeter too small to characterize low-attenuation lesion hepatic dome (image 10; series 2). Gallbladder is unremarkable. Pancreas: Unremarkable Spleen: Unremarkable Adrenals/Urinary Tract: Adrenal glands are normal. Kidneys enhance symmetrically with contrast. Unchanged cyst superior pole left kidney. Urinary bladder is decompressed. Stomach/Bowel: The stomach is fluid-filled and distended. The mid and distal small bowel is dilated and fluid-filled to the level of the right lower quadrant ileostomy. Small bowel loops measure up to 3.3 cm. Fecalization of the contents within the distal small bowel. Additionally, there is abrupt transition between decompressed mid small bowel and dilated mid to distal small bowel within the pelvis (image 53; series 5). 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 Vascular/Lymphatic: Extensive peripheral calcified atherosclerotic plaque involving the abdominal aorta. No retroperitoneal lymphadenopathy. Reproductive: Uterus and adnexal structures unremarkable. Other: Moderate volume free fluid in the pelvis. Musculoskeletal: Midline laparotomy incision. Lumbar spine degenerative changes. No aggressive or acute appearing osseous lesions. IMPRESSION: 1. Fairly long segment fluid-filled and dilated loop of small bowel beginning at the transition within the pelvis and remaining dilated to the level of the ileostomy within the right anterior abdomen. Findings are concerning for  obstruction. As there appears to be transition at both the proximal and distal aspect of the dilated distal small bowel, the possibility of a long segment closed loop obstruction is not entirely excluded. Moderate volume free fluid within the pelvis. Electronically Signed   By: Lovey Newcomer M.D.   On: 06/29/2018 20:59   Dg Abd 2 Views  Result Date: 06/30/2018 CLINICAL DATA:  Small bowel obstruction. EXAM: ABDOMEN - 2 VIEW COMPARISON:  06/29/2018 FINDINGS: Bowel-gas pattern has improved. Gas within nondistended small bowel loops in the mid abdomen. Contrast material within the urinary bladder from prior CT. Bilateral tubal ligation clips noted. No free air organomegaly. IMPRESSION: Improved bowel gas pattern.  No free air. Electronically Signed   By: Rolm Baptise M.D.   On: 06/30/2018 10:36   Dg Abdomen Acute W/chest  Result Date: 06/29/2018 CLINICAL DATA:  Abdominal pain periumbilical beginning this morning. Nausea and vomiting. Right lung cancer. EXAM: DG ABDOMEN ACUTE W/ 1V CHEST COMPARISON:  Chest x-ray 05/15/2018 and KUB 05/10/2018 FINDINGS: Lungs are adequately inflated with right upper lobe/perihilar scarring. Cardiomediastinal silhouette and remainder of the chest is unchanged. Abdominopelvic images demonstrate several air-filled dilated central small bowel loops measuring up to 3.4 cm in diameter. No free peritoneal air. Paucity of bowel gas within the colon. Remainder of the exam is unchanged. IMPRESSION: Several air-filled dilated small bowel loops with paucity of gas within the colon. Findings likely due to early/partial small bowel obstruction and less likely ileus. No acute cardiopulmonary disease. Right upper lobe/perihilar scarring. Electronically Signed   By: Marin Olp M.D.   On: 06/29/2018 20:11    ASSESSMENT AND PLAN:   Active Problems:   SBO (small bowel obstruction) ([place])  1.  Small bowel obstruction.    Initially the plan was to place NG tube, but patient had bowel movement  before the tube was placed,  Appreciated consult general surgery for further evaluation and  treatment. No surgical management at this time and started on clear liquid diet. 2.  Acute renal failure likely prerenal, due to poor p.o. Intake.  We will start gentle IV hydration and monitor kidney function closely.  Avoid nephrotoxic medications., Improving. 3.  CHF, currently clinically compensated, continue to monitor closely.  Continue Coreg. 4.  COPD, without acute exacerbation.  Continue maintenance therapy. 5.  Paroxysmal atrial fibrillation, currently with sinus tachycardia, heart rate 101.  Continue to monitor on telemetry.  Continue Coreg for rate control and Eliquis for anticoagulation.  6.  Remote history of breast and lung malignancies, status post treatment, currently in remission.  Continue follow-up and management, per oncology.   All the records are reviewed and case discussed with Care Management/Social Workerr. Management plans discussed with the patient, family and they are in agreement.  CODE STATUS: DNR,  TOTAL TIME TAKING CARE OF THIS PATIENT: 35 minutes.   Discussed with patient, her sister and niece in the room.  POSSIBLE D/C IN 1-2 DAYS, DEPENDING ON CLINICAL CONDITION.   [name] M.D on 06/30/2018   Between 7am to 6pm - Pager - [phone]  After 6pm go to [email or web address] - password [email or web address] [facility]  Office  [phone]  CC: Primary care physician; Elisabeth Cara, NP  Note: This dictation was prepared with Dragon dictation along with smaller phrase technology. Any transcriptional errors that result from this process are unintentional.

## 2018-07-01 LAB — BASIC METABOLIC PANEL
ANION GAP: 5 (ref 5–15)
BUN: 20 mg/dL (ref 8–23)
CO2: 13 mmol/L — ABNORMAL LOW (ref 22–32)
Calcium: 9.1 mg/dL (ref 8.9–10.3)
Chloride: 117 mmol/L — ABNORMAL HIGH (ref 98–111)
Creatinine, Ser: 1.17 mg/dL — ABNORMAL HIGH (ref 0.44–1.00)
GFR, EST AFRICAN AMERICAN: 56 mL/min — AB (ref >60)
GFR, EST NON AFRICAN AMERICAN: 48 mL/min — AB (ref >60)
Glucose, Bld: 80 mg/dL (ref 70–99)
Potassium: 4.5 mmol/L (ref 3.5–5.1)
SODIUM: 135 mmol/L (ref 135–145)

## 2018-07-01 LAB — GLUCOSE, CAPILLARY: GLUCOSE-CAPILLARY: 94 mg/dL (ref 70–99)

## 2018-07-01 NOTE — Consult Note (Signed)
[facility] Nurse wound follow up POuch is intact and not leaking.  Ordered supplies have not been delivered.  Will call materials and try again.  Provided patient with information for secure start indigent program. If she qualifies, this program would provide supplies free of charge.  Informed her that there is paperwork she will have to complete before she is enrolled.  COntact information is provided to her.  She will share with her daughter who is assisting her.  Will not follow at this time.  Please re-consult if needed.  Domenic Moras MSN, RN, FNP-BC CWON Wound, Ostomy, Continence Nurse Pager [phone]

## 2018-07-01 NOTE — Progress Notes (Addendum)
Spoke to patient in regards to discharge plan. Per Dr. Windell Moment note, will progress diet full liquid and assess for toleration. Maybe soft diet in the afternoon and possible discharge tomorrow if tolerates. Patient called daughter to let her know she would not be leaving today.

## 2018-07-01 NOTE — Care Management (Addendum)
Patient admitted from home with SBO.  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  Patient lives at home with daughter.  Patient does not have insurance.  PCP Elisabeth Cara at [facility].  Patient open with [facility] for [facility] and PT.  Patient has also previously been provided with charity RW and BSC. Patient states that she received a package from [employer] 3 weeks ago and has not received any since.    WOC RN has provided patient with information on the indigent program through secure start.   Patient is aware that it will be her responsibility to submit information.  RNCM offered to updated patient's daughter with this information, however she has declined.  Per patient "when the home health nurse comes out they dont even look at my ostomy, she just checks my vitals".  Jason with [facility] has been provided with this information. Corene Cornea is following up with the home RN about supplies in the home.  RNCM following.

## 2018-07-01 NOTE — Progress Notes (Signed)
[facility] Hospital Day(s): 2.   Post op day(s):  [name]   Interval History: Patient seen and examined, no acute events or new complaints overnight. Patient reports tolerated clear liquid. Denies nausea or vomiting.   Vital signs in last 24 hours: [min-max] current  Temp:  [98.6 F (37 C)-98.9 F (37.2 C)] 98.6 F (37 C) (09/17 0517) Pulse Rate:  [93-103] 103 (09/17 0517) Resp:  [17-20] 20 (09/17 0517) BP: (85-116)/(50-90) 116/71 (09/17 0517) SpO2:  [98 %-99 %] 99 % (09/17 0517) Weight:  [56.2 kg] 56.2 kg (09/17 0517)     Height: 5\' 7"  (170.2 cm) Weight: 56.2 kg BMI (Calculated): 19.4   Ostomy output: 850 mL in 24 hours.   Physical Exam:  Constitutional: alert, cooperative and no distress  Respiratory: breathing non-labored at rest  Cardiovascular: regular rate and sinus rhythm  Gastrointestinal: soft, non-tender, and non-distended. Ostomy pink and patent, retracted. Leakng on medial side of appliance. Midline wound with small area of opening and granulation tissue.   Labs:  CBC Latest Ref Rng & Units 06/30/2018 06/29/2018 06/04/2018  WBC 3.6 - 11.0 K/uL 8.7 12.7(H) 7.9  Hemoglobin 12.0 - 16.0 g/dL 9.2(L) 10.7(L) 9.8(L)  Hematocrit 35.0 - 47.0 % 27.7(L) 32.5(L) 29.7(L)  Platelets 150 - 440 K/uL 317 394 276   CMP Latest Ref Rng & Units 07/01/2018 06/30/2018 06/29/2018  Glucose 70 - 99 mg/dL 80 82 85  BUN 8 - 23 mg/dL 20 21 25(H)  Creatinine 0.44 - 1.00 mg/dL 1.17(H) 1.30(H) 1.50(H)  Sodium 135 - 145 mmol/L 135 133(L) 131(L)  Potassium 3.5 - 5.1 mmol/L 4.5 4.1 5.0  Chloride 98 - 111 mmol/L 117(H) 115(H) 111  CO2 22 - 32 mmol/L 13(L) 12(L) 14(L)  Calcium 8.9 - 10.3 mg/dL 9.1 8.7(L) 9.9  Total Protein 6.5 - 8.1 g/dL - - 8.1  Total Bilirubin 0.3 - 1.2 mg/dL - - 0.8  Alkaline Phos 38 - 126 U/L - - 78  AST 15 - 41 U/L - - 20  ALT 0 - 44 U/L - - 12    Imaging studies:  EXAM: ABDOMEN - 2 VIEW  COMPARISON:  06/29/2018  FINDINGS: Bowel-gas pattern has improved.  Gas within nondistended small bowel loops in the mid abdomen. Contrast material within the urinary bladder from prior CT. Bilateral tubal ligation clips noted. No free air organomegaly.  IMPRESSION: Improved bowel gas pattern.  No free air.   Electronically Signed   By: Rolm Baptise M.D.   On: 06/30/2018 10:36  Assessment/Plan:  62 y.o. female with small bowel obstruction  s/p right hemicolectomy with end ileostomy and mucus fistula for bleeding hepatic flexure, complicated by pertinent comorbidities including CHF, atrial fibrillation on anticoagulation, acute renal failure, COPD on oxygen at home, history of lung and breast cancer. Patient with resolving bowel obstruction. Will progress diet to full liquid and assess for toleration. Maybe soft diet in the afternoon and possible discharge tomorrow if tolerates. Will discuss with case manager about home care of ostomy nurse. Last admission the plan was to send a home nurse to take care of the difficult ostomy because it was leaking. Nurse that is going to her home to take care of her is just taking vitals and checking how she is doing but is not doing any ostomy care work. Daughter is taking care of the ostomy but is to difficult for her.    Arnold Long, MD

## 2018-07-02 LAB — BASIC METABOLIC PANEL
Anion gap: 4 — ABNORMAL LOW (ref 5–15)
BUN: 10 mg/dL (ref 8–23)
CALCIUM: 8.5 mg/dL — AB (ref 8.9–10.3)
CO2: 13 mmol/L — AB (ref 22–32)
Chloride: 120 mmol/L — ABNORMAL HIGH (ref 98–111)
Creatinine, Ser: 0.92 mg/dL (ref 0.44–1.00)
GFR calc Af Amer: >60 mL/min (ref >60)
GFR calc non Af Amer: >60 mL/min (ref >60)
Glucose, Bld: 84 mg/dL (ref 70–99)
Potassium: 3.9 mmol/L (ref 3.5–5.1)
SODIUM: 137 mmol/L (ref 135–145)

## 2018-07-02 LAB — CBC
HEMATOCRIT: 23.4 % — AB (ref 35.0–47.0)
Hemoglobin: 7.8 g/dL — ABNORMAL LOW (ref 12.0–16.0)
MCH: 31.6 pg (ref 26.0–34.0)
MCHC: 33.1 g/dL (ref 32.0–36.0)
MCV: 95.6 fL (ref 80.0–100.0)
Platelets: 262 10*3/uL (ref 150–440)
RBC: 2.45 MIL/uL — ABNORMAL LOW (ref 3.80–5.20)
RDW: 16.5 % — AB (ref 11.5–14.5)
WBC: 6.9 10*3/uL (ref 3.6–11.0)

## 2018-07-02 LAB — GLUCOSE, CAPILLARY: Glucose-Capillary: 78 mg/dL (ref 70–99)

## 2018-07-02 MED ORDER — FERROUS SULFATE 325 (65 FE) MG PO TABS
325.0000 mg | ORAL_TABLET | Freq: Two times a day (BID) | ORAL | Status: DC
  Administered 2018-07-02: 325 mg via ORAL
  Filled 2018-07-02: qty 1

## 2018-07-02 MED ORDER — FERROUS SULFATE 325 (65 FE) MG PO TABS: 325.0000 mg | ORAL_TABLET | Freq: Two times a day (BID) | ORAL | 3 refills | Status: DC

## 2018-07-02 NOTE — Progress Notes (Signed)
[facility] Hospital Day(s): 3.   Post op day(s):  [name]   Interval History: Patient seen and examined, no acute events or new complaints overnight. Patient reports feeling better today. Refers tolerated diet and denies nausea. Refers ostomy has not leak since yesterday.  Vital signs in last 24 hours: [min-max] current  Temp:  [98.6 F (37 C)-98.7 F (37.1 C)] 98.6 F (37 C) (09/18 0528) Pulse Rate:  [91-105] 93 (09/18 0528) Resp:  [18-20] 18 (09/18 0528) BP: (96-124)/(61-87) 116/68 (09/18 0528) SpO2:  [99 %-100 %] 100 % (09/18 0528) Weight:  [51.8 kg] 51.8 kg (09/18 0500)     Height: 5\' 7"  (170.2 cm) Weight: 51.8 kg BMI (Calculated): 17.88   Physical Exam:  Constitutional: alert, cooperative and no distress  Respiratory: breathing non-labored at rest  Cardiovascular: regular rate and sinus rhythm  Gastrointestinal: soft, non-tender, and non-distended. Ostomy pink and patent.   Labs:  CBC Latest Ref Rng & Units 07/02/2018 06/30/2018 06/29/2018  WBC 3.6 - 11.0 K/uL 6.9 8.7 12.7(H)  Hemoglobin 12.0 - 16.0 g/dL 7.8(L) 9.2(L) 10.7(L)  Hematocrit 35.0 - 47.0 % 23.4(L) 27.7(L) 32.5(L)  Platelets 150 - 440 K/uL 262 317 394   CMP Latest Ref Rng & Units 07/02/2018 07/01/2018 06/30/2018  Glucose 70 - 99 mg/dL 84 80 82  BUN 8 - 23 mg/dL 10 20 21   Creatinine 0.44 - 1.00 mg/dL 0.92 1.17(H) 1.30(H)  Sodium 135 - 145 mmol/L 137 135 133(L)  Potassium 3.5 - 5.1 mmol/L 3.9 4.5 4.1  Chloride 98 - 111 mmol/L 120(H) 117(H) 115(H)  CO2 22 - 32 mmol/L 13(L) 13(L) 12(L)  Calcium 8.9 - 10.3 mg/dL 8.5(L) 9.1 8.7(L)  Total Protein 6.5 - 8.1 g/dL - - -  Total Bilirubin 0.3 - 1.2 mg/dL - - -  Alkaline Phos 38 - 126 U/L - - -  AST 15 - 41 U/L - - -  ALT 0 - 44 U/L - - -    Imaging studies: No new pertinent imaging studies   Assessment/Plan:  61 y.o.femalewith small bowel obstructions/pright hemicolectomy with end ileostomy and mucus fistulafor bleeding hepatic flexure, complicated  by pertinent comorbidities includingCHF, atrial fibrillation on anticoagulation, acute renal failure, COPD on oxygen at home, history of lung and breast cancer.  Patient today feeling well. Tolerated soft diet. Ostomy bag working better after care by ostomy nurse. From surgery standpoint, small bowel obstruction resolved and can be discharged home.   Arnold Long, MD

## 2018-07-02 NOTE — Progress Notes (Signed)
Patient cleared for discharge per Dr. Anselm Jungling. Belongings, new prescription, and discharge instructions given to patient. AVS documentation completed. Patient questions answered and escorted patient down via wheelchair to leave in personal vehicle with daughter to return to home residence.

## 2018-07-02 NOTE — Care Management Note (Signed)
Case Management Note  Patient Details  Name: Danielle [name] MRN: [id number] Date of Birth: 1956-07-08   Patient was discharged home today.  Resumption orders were placed.  Corene Cornea with [facility] notified of discharge.  Per [facility] has ordered additional ostomy supplies to be delivered to the home. No new prescriptions at discharge.    Update:  Notified by [facility] that after discharge patient called stating that her ostomy was leaking.  [facility] RN to go to home today.   Subjective/Objective:                    Action/Plan:   Expected Discharge Date:  07/02/18               Expected Discharge Plan:  [place]  In-House Referral:     Discharge planning Services  CM Consult  Post Acute Care Choice:  Resumption of Svcs/PTA Provider Choice offered to:     DME Arranged:    DME Agency:     HH Arranged:  RN, PT [place] Agency:  [facility]  Status of Service:  Completed, signed off  If discussed at [place] of Stay Meetings, dates discussed:    Additional Comments:  Beverly Sessions, RN 07/02/2018, 3:39 PM

## 2018-07-02 NOTE — Progress Notes (Signed)
[place] [facility] [place] NAME: Danielle [name]    MR#:  [id number]  DATE OF BIRTH:  09-Sep-1956  SUBJECTIVE:  CHIEF COMPLAINT:   Chief Complaint  Patient presents with  . Abdominal Pain   History of hemicolectomy and ileostomy-came with vomiting and pain and noted to have small bowel obstruction. It resolved spontaneously and patient had significant amount of loose stool coming out in her colostomy bag. Feels fine today, tolerated liquid diet, have some leaking around her ostomy bag.  REVIEW OF SYSTEMS:  CONSTITUTIONAL: No fever, fatigue or weakness.  EYES: No blurred or double vision.  EARS, NOSE, AND THROAT: No tinnitus or ear pain.  RESPIRATORY: No cough, shortness of breath, wheezing or hemoptysis.  CARDIOVASCULAR: No chest pain, orthopnea, edema.  GASTROINTESTINAL: She had nausea, vomiting, no diarrhea or abdominal pain.  GENITOURINARY: No dysuria, hematuria.  ENDOCRINE: No polyuria, nocturia,  HEMATOLOGY: No anemia, easy bruising or bleeding SKIN: No rash or lesion. MUSCULOSKELETAL: No joint pain or arthritis.   NEUROLOGIC: No tingling, numbness, weakness.  PSYCHIATRY: No anxiety or depression.   ROS  DRUG ALLERGIES:  No Known Allergies  VITALS:  Blood pressure 116/68, pulse 93, temperature 98.6 F (37 C), temperature source Oral, resp. rate 18, height 5\' 7"  (1.702 m), weight 51.8 kg, SpO2 100 %.  PHYSICAL EXAMINATION:  GENERAL:  62 y.o.-year-old patient lying in the bed with no acute distress.  EYES: Pupils equal, round, reactive to light and accommodation. No scleral icterus. Extraocular muscles intact.  HEENT: Head atraumatic, normocephalic. Oropharynx and nasopharynx clear.  NECK:  Supple, no jugular venous distention. No thyroid enlargement, no tenderness.  LUNGS: Normal breath sounds bilaterally, no wheezing, rales,rhonchi or crepitation. No use of accessory muscles of respiration.  CARDIOVASCULAR: S1, S2 normal. No murmurs,  rubs, or gallops.  ABDOMEN: Soft, nontender, nondistended. Bowel sounds present. No organomegaly or mass. Ileostomy bag in place. EXTREMITIES: No pedal edema, cyanosis, or clubbing.  NEUROLOGIC: Cranial nerves II through XII are intact. Muscle strength 5/5 in all extremities. Sensation intact. Gait not checked.  PSYCHIATRIC: The patient is alert and oriented x 3.  SKIN: No obvious rash, lesion, or ulcer.   Physical Exam LABORATORY PANEL:   CBC Recent Labs  Lab 07/02/18 0623  WBC 6.9  HGB 7.8*  HCT 23.4*  PLT 262   ------------------------------------------------------------------------------------------------------------------  Chemistries  Recent Labs  Lab 06/29/18 1930  07/01/18 0510  NA 131*   < > 135  K 5.0   < > 4.5  CL 111   < > 117*  CO2 14*   < > 13*  GLUCOSE 85   < > 80  BUN 25*   < > 20  CREATININE 1.50*   < > 1.17*  CALCIUM 9.9   < > 9.1  AST 20  --   --   ALT 12  --   --   ALKPHOS 78  --   --   BILITOT 0.8  --   --    < > = values in this interval not displayed.   ------------------------------------------------------------------------------------------------------------------  Cardiac Enzymes No results for input(s): TROPONINI in the last 168 hours. ------------------------------------------------------------------------------------------------------------------  RADIOLOGY:  Dg Abd 2 Views  Result Date: 06/30/2018 CLINICAL DATA:  Small bowel obstruction. EXAM: ABDOMEN - 2 VIEW COMPARISON:  06/29/2018 FINDINGS: Bowel-gas pattern has improved. Gas within nondistended small bowel loops in the mid abdomen. Contrast material within the urinary bladder from prior CT. Bilateral tubal ligation clips noted. No free  air organomegaly. IMPRESSION: Improved bowel gas pattern.  No free air. Electronically Signed   By: Rolm Baptise M.D.   On: 06/30/2018 10:36    ASSESSMENT AND PLAN:   Active Problems:   SBO (small bowel obstruction) ([place])  1.  Small bowel  obstruction.    Initially the plan was to place NG tube, but patient had bowel movement before the tube was placed,  Appreciated consult general surgery for further evaluation and treatment. No surgical management at this time and started on clear liquid diet.  Surgery is upgrading diet slowly- suggest to have ostomy nurse consult and set up as out pt. 2.  Acute renal failure likely prerenal, due to poor p.o. Intake.    gentle IV hydration and monitor kidney function closely.  Avoid nephrotoxic medications., Improving. 3.  CHF, currently clinically compensated, continue to monitor closely.  Continue Coreg. 4.  COPD, without acute exacerbation.  Continue maintenance therapy. 5.  Paroxysmal atrial fibrillation, currently with sinus tachycardia, heart rate 101.  Continue to monitor on telemetry.  Continue Coreg for rate control and Eliquis for anticoagulation.  6.  Remote history of breast and lung malignancies, status post treatment, currently in remission.  Continue follow-up and management, per oncology.   All the records are reviewed and case discussed with Care Management/Social Workerr. Management plans discussed with the patient, family and they are in agreement.  CODE STATUS: DNR,  TOTAL TIME TAKING CARE OF THIS PATIENT: 35 minutes.    POSSIBLE D/C IN 1-2 DAYS, DEPENDING ON CLINICAL CONDITION.   [name] M.D on 07/02/2018   Between 7am to 6pm - Pager - [phone]  After 6pm go to [email or web address] - password [email or web address] [facility]  Office  [phone]  CC: Primary care physician; Elisabeth Cara, NP  Note: This dictation was prepared with Dragon dictation along with smaller phrase technology. Any transcriptional errors that result from this process are unintentional.

## 2018-07-02 NOTE — Progress Notes (Signed)
   07/02/18 1122  Clinical Encounter Type  Visited With Patient and family together  Visit Type Follow-up  Referral From Nurse  Recommendations Follow-up as requested.  Stress Factors  Family Stress Factors  (Advance Directives revision.)  Advance Directives (For Healthcare)  Does Patient Have a Medical Advance Directive? Yes  Type of Paramedic of [place];Living will  Copy of [place] in Chart? Yes  Copy of Living Will in Chart? Yes  Mental Health Advance Directives  Does Patient Have a Mental Health Advance Directive? Yes (Question included in standard AD.)   Chaplain was paged to amend the patient's AD, which was completed yesterday by the Chaplain. The patient did not include her birthday, writing the day's date instead. Chaplain assisted the patient to add her birthday, to prevent any possible discrepancy. A copy was included in the patient's chart.

## 2018-07-05 ENCOUNTER — Emergency Department: Payer: Medicaid Other

## 2018-07-05 ENCOUNTER — Encounter: Payer: Self-pay | Admitting: *Deleted

## 2018-07-05 ENCOUNTER — Inpatient Hospital Stay
Admission: EM | Admit: 2018-07-05 | Discharge: 2018-07-09 | DRG: 389 | Disposition: A | Discharge status: Home Health | Payer: Medicaid Other | Attending: Internal Medicine | Admitting: Internal Medicine

## 2018-07-05 ENCOUNTER — Other Ambulatory Visit: Payer: Self-pay

## 2018-07-05 DIAGNOSIS — F329 Major depressive disorder, single episode, unspecified: Secondary | ICD-10-CM | POA: Diagnosis present

## 2018-07-05 DIAGNOSIS — E876 Hypokalemia: Secondary | ICD-10-CM | POA: Diagnosis not present

## 2018-07-05 DIAGNOSIS — Z923 Personal history of irradiation: Secondary | ICD-10-CM

## 2018-07-05 DIAGNOSIS — Z9981 Dependence on supplemental oxygen: Secondary | ICD-10-CM

## 2018-07-05 DIAGNOSIS — F419 Anxiety disorder, unspecified: Secondary | ICD-10-CM | POA: Diagnosis present

## 2018-07-05 DIAGNOSIS — I110 Hypertensive heart disease with heart failure: Secondary | ICD-10-CM | POA: Diagnosis present

## 2018-07-05 DIAGNOSIS — M1990 Unspecified osteoarthritis, unspecified site: Secondary | ICD-10-CM | POA: Diagnosis present

## 2018-07-05 DIAGNOSIS — Z9012 Acquired absence of left breast and nipple: Secondary | ICD-10-CM

## 2018-07-05 DIAGNOSIS — R710 Precipitous drop in hematocrit: Secondary | ICD-10-CM | POA: Diagnosis not present

## 2018-07-05 DIAGNOSIS — Z85118 Personal history of other malignant neoplasm of bronchus and lung: Secondary | ICD-10-CM | POA: Diagnosis not present

## 2018-07-05 DIAGNOSIS — J449 Chronic obstructive pulmonary disease, unspecified: Secondary | ICD-10-CM | POA: Diagnosis present

## 2018-07-05 DIAGNOSIS — Z803 Family history of malignant neoplasm of breast: Secondary | ICD-10-CM | POA: Diagnosis not present

## 2018-07-05 DIAGNOSIS — Z933 Colostomy status: Secondary | ICD-10-CM

## 2018-07-05 DIAGNOSIS — Z87891 Personal history of nicotine dependence: Secondary | ICD-10-CM | POA: Diagnosis not present

## 2018-07-05 DIAGNOSIS — Z9221 Personal history of antineoplastic chemotherapy: Secondary | ICD-10-CM

## 2018-07-05 DIAGNOSIS — Z681 Body mass index (BMI) 19 or less, adult: Secondary | ICD-10-CM | POA: Diagnosis not present

## 2018-07-05 DIAGNOSIS — K56609 Unspecified intestinal obstruction, unspecified as to partial versus complete obstruction: Principal | ICD-10-CM

## 2018-07-05 DIAGNOSIS — N179 Acute kidney failure, unspecified: Secondary | ICD-10-CM | POA: Diagnosis present

## 2018-07-05 DIAGNOSIS — R011 Cardiac murmur, unspecified: Secondary | ICD-10-CM | POA: Diagnosis present

## 2018-07-05 DIAGNOSIS — Z7901 Long term (current) use of anticoagulants: Secondary | ICD-10-CM

## 2018-07-05 DIAGNOSIS — Z66 Do not resuscitate: Secondary | ICD-10-CM | POA: Diagnosis present

## 2018-07-05 DIAGNOSIS — I4891 Unspecified atrial fibrillation: Secondary | ICD-10-CM | POA: Diagnosis present

## 2018-07-05 DIAGNOSIS — E875 Hyperkalemia: Secondary | ICD-10-CM | POA: Diagnosis present

## 2018-07-05 DIAGNOSIS — E440 Moderate protein-calorie malnutrition: Secondary | ICD-10-CM | POA: Diagnosis present

## 2018-07-05 DIAGNOSIS — Z23 Encounter for immunization: Secondary | ICD-10-CM

## 2018-07-05 DIAGNOSIS — E860 Dehydration: Secondary | ICD-10-CM | POA: Diagnosis present

## 2018-07-05 DIAGNOSIS — E119 Type 2 diabetes mellitus without complications: Secondary | ICD-10-CM | POA: Diagnosis present

## 2018-07-05 DIAGNOSIS — Z9049 Acquired absence of other specified parts of digestive tract: Secondary | ICD-10-CM

## 2018-07-05 DIAGNOSIS — I5022 Chronic systolic (congestive) heart failure: Secondary | ICD-10-CM | POA: Diagnosis present

## 2018-07-05 DIAGNOSIS — Z853 Personal history of malignant neoplasm of breast: Secondary | ICD-10-CM | POA: Diagnosis not present

## 2018-07-05 LAB — CBC WITH DIFFERENTIAL/PLATELET
Basophils Absolute: 0.1 10*3/uL (ref 0–0.1)
Basophils Relative: 1 %
EOS ABS: 0.1 10*3/uL (ref 0–0.7)
Eosinophils Relative: 1 %
HCT: 35.1 % (ref 35.0–47.0)
HEMOGLOBIN: 11.5 g/dL — AB (ref 12.0–16.0)
LYMPHS ABS: 3.5 10*3/uL (ref 1.0–3.6)
LYMPHS PCT: 33 %
MCH: 31.1 pg (ref 26.0–34.0)
MCHC: 32.8 g/dL (ref 32.0–36.0)
MCV: 94.9 fL (ref 80.0–100.0)
MONOS PCT: 5 %
Monocytes Absolute: 0.5 10*3/uL (ref 0.2–0.9)
NEUTROS PCT: 60 %
Neutro Abs: 6.3 10*3/uL (ref 1.4–6.5)
Platelets: 378 10*3/uL (ref 150–440)
RBC: 3.69 MIL/uL — AB (ref 3.80–5.20)
RDW: 15.9 % — ABNORMAL HIGH (ref 11.5–14.5)
WBC: 10.6 10*3/uL (ref 3.6–11.0)

## 2018-07-05 LAB — COMPREHENSIVE METABOLIC PANEL
ALK PHOS: 72 U/L (ref 38–126)
ALT: 7 U/L (ref 0–44)
ANION GAP: 11 (ref 5–15)
AST: 37 U/L (ref 15–41)
Albumin: 4.3 g/dL (ref 3.5–5.0)
BILIRUBIN TOTAL: 1 mg/dL (ref 0.3–1.2)
BUN: 11 mg/dL (ref 8–23)
CALCIUM: 10.1 mg/dL (ref 8.9–10.3)
CO2: 15 mmol/L — AB (ref 22–32)
CREATININE: 1.71 mg/dL — AB (ref 0.44–1.00)
Chloride: 112 mmol/L — ABNORMAL HIGH (ref 98–111)
GFR, EST AFRICAN AMERICAN: 35 mL/min — AB (ref >60)
GFR, EST NON AFRICAN AMERICAN: 30 mL/min — AB (ref >60)
Glucose, Bld: 112 mg/dL — ABNORMAL HIGH (ref 70–99)
Potassium: 5.4 mmol/L — ABNORMAL HIGH (ref 3.5–5.1)
SODIUM: 138 mmol/L (ref 135–145)
Total Protein: 8.4 g/dL — ABNORMAL HIGH (ref 6.5–8.1)

## 2018-07-05 LAB — LIPASE, BLOOD: LIPASE: 26 U/L (ref 11–51)

## 2018-07-05 MED ORDER — ONDANSETRON HCL 4 MG/2ML IJ SOLN: 4.0000 mg | Freq: Four times a day (QID) | INTRAMUSCULAR | Status: DC | PRN

## 2018-07-05 MED ORDER — ACETAMINOPHEN 650 MG RE SUPP: 650.0000 mg | Freq: Four times a day (QID) | RECTAL | Status: DC | PRN

## 2018-07-05 MED ORDER — CARVEDILOL 6.25 MG PO TABS
6.2500 mg | ORAL_TABLET | Freq: Two times a day (BID) | ORAL | Status: DC
  Administered 2018-07-05 – 2018-07-09 (×8): 6.25 mg via ORAL
  Filled 2018-07-05 (×8): qty 1

## 2018-07-05 MED ORDER — SODIUM CHLORIDE 0.9 % IV SOLN
INTRAVENOUS | Status: AC
  Administered 2018-07-05 – 2018-07-06 (×2): via INTRAVENOUS

## 2018-07-05 MED ORDER — PNEUMOCOCCAL VAC POLYVALENT 25 MCG/0.5ML IJ INJ
0.5000 mL | INJECTION | INTRAMUSCULAR | Status: AC
  Administered 2018-07-09: 0.5 mL via INTRAMUSCULAR
  Filled 2018-07-05: qty 0.5

## 2018-07-05 MED ORDER — MEGESTROL ACETATE 400 MG/10ML PO SUSP
300.0000 mg | Freq: Two times a day (BID) | ORAL | Status: DC
  Administered 2018-07-05: 300 mg via ORAL
  Filled 2018-07-05 (×4): qty 10

## 2018-07-05 MED ORDER — MORPHINE SULFATE (PF) 2 MG/ML IV SOLN
2.0000 mg | INTRAVENOUS | Status: DC | PRN
  Administered 2018-07-05 – 2018-07-08 (×4): 2 mg via INTRAVENOUS
  Filled 2018-07-05 (×4): qty 1

## 2018-07-05 MED ORDER — ACETAMINOPHEN 325 MG PO TABS
650.0000 mg | ORAL_TABLET | Freq: Four times a day (QID) | ORAL | Status: DC | PRN
  Administered 2018-07-05 – 2018-07-07 (×2): 650 mg via ORAL
  Filled 2018-07-05 (×2): qty 2

## 2018-07-05 MED ORDER — FERROUS SULFATE 325 (65 FE) MG PO TABS: 325.0000 mg | ORAL_TABLET | Freq: Two times a day (BID) | ORAL | Status: DC

## 2018-07-05 MED ORDER — MORPHINE SULFATE (PF) 4 MG/ML IV SOLN
4.0000 mg | Freq: Once | INTRAVENOUS | Status: AC
  Administered 2018-07-05: 4 mg via INTRAVENOUS
  Filled 2018-07-05: qty 1

## 2018-07-05 MED ORDER — ADULT MULTIVITAMIN W/MINERALS CH
1.0000 | ORAL_TABLET | Freq: Every day | ORAL | Status: DC
  Administered 2018-07-05: 1 via ORAL
  Filled 2018-07-05: qty 1

## 2018-07-05 MED ORDER — FUROSEMIDE 20 MG PO TABS
20.0000 mg | ORAL_TABLET | Freq: Every day | ORAL | Status: DC
  Administered 2018-07-05: 20 mg via ORAL
  Filled 2018-07-05: qty 1

## 2018-07-05 MED ORDER — APIXABAN 2.5 MG PO TABS: 2.5000 mg | ORAL_TABLET | Freq: Two times a day (BID) | ORAL | Status: DC

## 2018-07-05 MED ORDER — CITALOPRAM HYDROBROMIDE 20 MG PO TABS
40.0000 mg | ORAL_TABLET | Freq: Every day | ORAL | Status: DC
  Administered 2018-07-05: 40 mg via ORAL
  Filled 2018-07-05: qty 2

## 2018-07-05 MED ORDER — ONDANSETRON HCL 4 MG PO TABS: 4.0000 mg | ORAL_TABLET | Freq: Four times a day (QID) | ORAL | Status: DC | PRN

## 2018-07-05 MED ORDER — POTASSIUM CHLORIDE IN NACL 40-0.9 MEQ/L-% IV SOLN: INTRAVENOUS | Status: DC

## 2018-07-05 MED ORDER — ONDANSETRON HCL 4 MG/2ML IJ SOLN
4.0000 mg | Freq: Once | INTRAMUSCULAR | Status: AC
  Administered 2018-07-05: 4 mg via INTRAVENOUS
  Filled 2018-07-05: qty 2

## 2018-07-05 MED ORDER — HYDROCODONE-ACETAMINOPHEN 5-325 MG PO TABS: 1.0000 | ORAL_TABLET | Freq: Four times a day (QID) | ORAL | Status: DC | PRN

## 2018-07-05 MED ORDER — DOCUSATE SODIUM 100 MG PO CAPS
100.0000 mg | ORAL_CAPSULE | Freq: Two times a day (BID) | ORAL | Status: DC
  Administered 2018-07-05: 100 mg via ORAL
  Filled 2018-07-05: qty 1

## 2018-07-05 MED ORDER — INFLUENZA VAC SPLIT QUAD 0.5 ML IM SUSY
0.5000 mL | PREFILLED_SYRINGE | INTRAMUSCULAR | Status: AC
  Administered 2018-07-09: 0.5 mL via INTRAMUSCULAR
  Filled 2018-07-05: qty 0.5

## 2018-07-05 MED ORDER — ALBUTEROL SULFATE (2.5 MG/3ML) 0.083% IN NEBU: 2.5000 mg | INHALATION_SOLUTION | RESPIRATORY_TRACT | Status: DC | PRN

## 2018-07-05 MED ORDER — FLUTICASONE FUROATE-VILANTEROL 200-25 MCG/INH IN AEPB
1.0000 | INHALATION_SPRAY | Freq: Every day | RESPIRATORY_TRACT | Status: DC
  Administered 2018-07-05 – 2018-07-09 (×5): 1 via RESPIRATORY_TRACT
  Filled 2018-07-05: qty 28

## 2018-07-05 NOTE — Progress Notes (Signed)
Patient admitted for vomiting s/t SBO.  Patient has a ostomy bag with increased output.  CT abdomen shows SBO.  Patient is on apixaban 5 mg bid and is currently in AKI s/t vomiting. Spoke w/ hospitalist about decreasing dose to 2.5 mg bid considering patient's body weight < 60 kg and Scr > 1.5 mg/dL.  Hospitalist requests to hold anticoagulation altogether until surgery can assess patient. Patient is currently undergoing NGT, if planned surgery will have to switch to heparin for coverage of afib.  Will set apixaban to be held until 9/23 at 1000 for reduced dose.  Tobie Lords, PharmD, BCPS Clinical Pharmacist 07/05/2018

## 2018-07-05 NOTE — Progress Notes (Signed)
Advanced care plan.  Purpose of the Encounter: CODE STATUS  Parties in Attendance:Patient  Patient's Decision Capacity:Good  Subjective/Patient's story: Patient presented to the emergency room with abdominal discomfort, nausea and vomiting   Objective/Medical story Patient has bowel obstruction Patient has decreased ileostomy output She was worked up with CT abdomen which showed bowel obstruction Needs NG tube with suction and surgical consultation   Goals of care determination:  Advance care directives and goals of care discussed with the patient Patient will need NG tube with suction.  Serial abdominal imaging and a surgical follow-up for any intervention for the bowel obstruction. She is DNR by CODE STATUS No cardiac resuscitation, intubation and ventilator if the need arises   CODE STATUS: DNR   Time spent discussing advanced care planning: 16 minutes

## 2018-07-05 NOTE — ED Notes (Signed)
Report to Andrea, RN.

## 2018-07-05 NOTE — ED Provider Notes (Signed)
[facility] Emergency Department Provider Note  ___________________________________________   First MD Initiated Contact with Patient 07/05/18 0038     (approximate)  I have reviewed the triage vital signs and the nursing notes.   HISTORY  Chief Complaint Abdominal Pain and Emesis   HPI Danielle [name] is a 62 y.o. female with a history of left-sided mastectomy as well as right hemicolectomy with end ileostomy who was presented to the emergency department with right lower quadrant abdominal pain as well as 3 episodes of vomiting earlier this evening.  Denies any nausea at this time.  Says that she has 9 out of 10 cramping pain to the right lower quadrant currently.  Says this is similar pain to what she experienced earlier in the week when she was admitted for small bowel obstruction.  Patient also with atrial fibrillation on Eliquis.   Past Medical History:  Diagnosis Date  . Anxiety   . Arthritis   . Cancer ([place]) left    breast cancer 2000, chemo tx's with total mastectomy and lymph nodes resected.   . Cancer of right lung ([place]) 02/21/2016   rad tx's.   . CHF (congestive heart failure) ([place])   . COPD (chronic obstructive pulmonary disease) ([place])   . Dependence on supplemental oxygen   . Depression   . Diabetes mellitus without complication ([place])   . Heart murmur   . Hypertension   . Lung nodule   . Lymphedema   . Shortness of breath dyspnea    with exertion  . Status post chemotherapy 2001   left breast cancer  . Status post radiation therapy 2001   left breast cancer    Patient Active Problem List   Diagnosis Date Noted  . SBO (small bowel obstruction) ([place]) 06/29/2018  . Chronic systolic heart failure ([place]) 06/09/2018  . HTN (hypertension) 06/09/2018  . Atrial fibrillation ([place]) 06/09/2018  . Lymphedema 06/09/2018  . ARF (acute renal failure) ([place]) 06/04/2018  . Protein-calorie malnutrition, severe 06/04/2018  . Blood in stool   .  Focal (segmental) acute (reversible) ischemia of large intestine ([place])   . Ulceration of intestine   . Abdominal pain, right upper quadrant   . Diverticulosis of large intestine without diverticulitis   . Abnormal CT scan, colon   . Generalized abdominal pain   . Colitis   . COPD exacerbation ([place])   . Malnutrition of moderate degree 04/23/2018  . Palliative care by specialist   . DNR (do not resuscitate) discussion   . Weakness generalized   . Hyponatremia 02/10/2017  . Syncope 02/09/2017  . Lung nodule, solitary 07/25/2016  . Malignant neoplasm of upper lobe of right lung ([place]) 07/25/2016  . Carcinoma of overlapping sites of left breast in female, estrogen receptor positive ([place]) 06/22/2016  . Multiple lung nodules 06/22/2016  . Cough   . Lesion of right lung   . Breast cancer in female [facility]) 02/21/2016  . DOE (dyspnea on exertion) 02/14/2016  . Moderate COPD (chronic obstructive pulmonary disease) ([place]) 08/24/2014    Past Surgical History:  Procedure Laterality Date  . Breast Biospy Left    [facility]  . BREAST SURGERY    . COLONOSCOPY N/A 04/30/2018   Procedure: COLONOSCOPY;  Surgeon: Virgel Manifold, MD;  Location: [facility] ENDOSCOPY;  Service: Endoscopy;  Laterality: N/A;  . DILATION AND CURETTAGE OF UTERUS    . ELECTROMAGNETIC NAVIGATION BROCHOSCOPY Right 04/11/2016   Procedure: ELECTROMAGNETIC NAVIGATION BRONCHOSCOPY;  Surgeon: Vilinda Boehringer, MD;  Location:  [facility] ORS;  Service: Cardiopulmonary;  Laterality: Right;  . ESOPHAGOGASTRODUODENOSCOPY (EGD) WITH PROPOFOL N/A 05/07/2018   Procedure: ESOPHAGOGASTRODUODENOSCOPY (EGD) WITH PROPOFOL;  Surgeon: Lucilla Lame, MD;  Location: [facility] ENDOSCOPY;  Service: Endoscopy;  Laterality: N/A;  . history of colonoscopy]    . ILEOSTOMY    . LAPAROTOMY Right 05/04/2018   Procedure: EXPLORATORY LAPAROTOMY right colectomy right and left ostomy;  Surgeon: Herbert Pun, MD;  Location: [facility] ORS;  Service: General;  Laterality: Right;  .  LUNG BIOPSY    . MASTECTOMY Left    2000, [facility]  . ROTATOR CUFF REPAIR Right    [facility]    Prior to Admission medications   Medication Sig Start Date End Date Taking? Authorizing Provider  albuterol (PROVENTIL HFA;VENTOLIN HFA) 108 (90 Base) MCG/ACT inhaler Inhale 2 puffs into the lungs every 6 (six) hours as needed for wheezing or shortness of breath. 04/24/18  Yes Demetrios Loll, MD  apixaban (ELIQUIS) 5 MG TABS tablet Take 1 tablet (5 mg total) by mouth 2 (two) times daily. 06/23/18  Yes [name], MD  budesonide-formoterol (SYMBICORT) 160-4.5 MCG/ACT inhaler Inhale 2 puffs into the lungs 2 (two) times daily. 04/24/18  Yes Demetrios Loll, MD  carvedilol (COREG) 6.25 MG tablet Take 1 tablet (6.25 mg total) by mouth 2 (two) times daily. 06/24/18  Yes Hackney, Tina A, FNP  citalopram (CELEXA) 40 MG tablet Take 40 mg by mouth daily.   Yes [provider]  ferrous sulfate 325 (65 FE) MG tablet Take 1 tablet (325 mg total) by mouth 2 (two) times daily with a meal. 07/02/18  Yes [name], MD  furosemide (LASIX) 20 MG tablet Take 20 mg by mouth daily.    Yes [provider]  HYDROcodone-acetaminophen (NORCO/VICODIN) 5-325 MG tablet Take 1 tablet by mouth every 6 (six) hours as needed for moderate pain or severe pain.   Yes [provider]  megestrol (MEGACE) 400 MG/10ML suspension Take 7.5 mLs (300 mg total) by mouth 2 (two) times daily. 06/05/18  Yes Fritzi Mandes, MD  Multiple Vitamin (MULTIVITAMIN WITH MINERALS) TABS tablet Take 1 tablet by mouth daily. 06/06/18  Yes Fritzi Mandes, MD    Allergies Patient has no known allergies.  Family History  Problem Relation Age of Onset  . Breast cancer Mother 64  . Cancer Mother        Breast   . Cirrhosis Father   . Breast cancer Paternal Aunt 63  . Cancer Maternal Aunt        Breast     Social History Social History   Tobacco Use  . Smoking status: Former Smoker    Packs/day: 0.50    Years: 20.00    Pack  years: 10.00    Types: Cigarettes    Last attempt to quit: 07/02/2012    Years since quitting: 6.0  . Smokeless tobacco: Never Used  . Tobacco comment: quit 2014  Substance Use Topics  . Alcohol use: Yes    Comment: Occasionally  . Drug use: No    Review of Systems  Constitutional: No fever/chills Eyes: No visual changes. ENT: No sore throat. Cardiovascular: Denies chest pain. Respiratory: Denies shortness of breath. Gastrointestinal:   No diarrhea.  No constipation. Genitourinary: Negative for dysuria. Musculoskeletal: Negative for back pain. Skin: Negative for rash. Neurological: Negative for headaches, focal weakness or numbness.   ____________________________________________   PHYSICAL EXAM:  VITAL SIGNS: ED Triage Vitals  Enc Vitals Group     BP 07/05/18 0041 113/90  Pulse Rate 07/05/18 0041 (!) 108     Resp 07/05/18 0041 18     Temp 07/05/18 0041 98 F (36.7 C)     Temp Source 07/05/18 0041 Oral     SpO2 07/05/18 0040 97 %     Weight 07/05/18 0043 124 lb (56.2 kg)     Height 07/05/18 0043 5\' 7"  (1.702 m)     Head Circumference --      Peak Flow --      Pain Score 07/05/18 0042 10     Pain Loc --      Pain Edu? --      Excl. in [place]? --     Constitutional: Alert and oriented. Well appearing and in no acute distress. Eyes: Conjunctivae are normal.  Head: Atraumatic. Nose: No congestion/rhinnorhea. Mouth/Throat: Mucous membranes are moist.  Neck: No stridor.   Cardiovascular: Normal rate, regular rhythm. Grossly normal heart sounds.  Good peripheral circulation. Respiratory: Normal respiratory effort.  No retractions. Lungs CTAB. Gastrointestinal: Abdomen is soft and mildly distended but with tenderness to palpation which is moderate to the right lower quadrant.  Ostomy with brown stool, liquid like, in the bag. Musculoskeletal: No lower extremity tenderness nor edema.  No joint effusions. Neurologic:  Normal speech and language. No gross focal  neurologic deficits are appreciated. Skin:  Skin is warm, dry and intact. No rash noted. Psychiatric: Mood and affect are normal. Speech and behavior are normal.  ____________________________________________   LABS (all labs ordered are listed, but only abnormal results are displayed)  Labs Reviewed  CBC WITH DIFFERENTIAL/PLATELET - Abnormal; Notable for the following components:      Result Value   RBC 3.69 (*)    Hemoglobin 11.5 (*)    RDW 15.9 (*)    All other components within normal limits  COMPREHENSIVE METABOLIC PANEL - Abnormal; Notable for the following components:   Potassium 5.4 (*)    Chloride 112 (*)    CO2 15 (*)    Glucose, Bld 112 (*)    Creatinine, Ser 1.71 (*)    Total Protein 8.4 (*)    GFR calc non Af Amer 30 (*)    GFR calc Af Amer 35 (*)    All other components within normal limits  LIPASE, BLOOD  URINALYSIS, COMPLETE (UACMP) WITH MICROSCOPIC   ____________________________________________  EKG   ____________________________________________  RADIOLOGY  CT the abdomen with a small bowel obstruction with interval worsening.  Also with increased ascites since last scan. ____________________________________________   PROCEDURES  Procedure(s) performed:   Procedures  Critical Care performed:   ____________________________________________   INITIAL IMPRESSION / ASSESSMENT AND PLAN / ED COURSE  Pertinent labs & imaging results that were available during my care of the patient were reviewed by me and considered in my medical decision making (see chart for details).  Differential diagnosis includes, but is not limited to, ovarian cyst, ovarian torsion, acute appendicitis, diverticulitis, urinary tract infection/pyelonephritis, endometriosis, bowel obstruction, colitis, renal colic, gastroenteritis, hernia, fibroids, endometriosis, pregnancy related pain including ectopic pregnancy, etc. As part of my medical decision making, I reviewed the  following data within the electronic MEDICAL RECORD NUMBER Notes from prior ED visits  ----------------------------------------- 2:26 AM on 07/05/2018 -----------------------------------------  Patient updated about CT results need for NG tube and admission to the hospital.  She is understanding the diagnosis as well as treatment plan willing to comply.  Signed out to Dr. Marcille Blanco of the medicine service. ____________________________________________   FINAL CLINICAL IMPRESSION(S) / ED  DIAGNOSES  Final diagnoses:  SBO (small bowel obstruction) (HCC)      NEW MEDICATIONS STARTED DURING THIS VISIT:  New Prescriptions   No medications on file     Note:  This document was prepared using Dragon voice recognition software and may include unintentional dictation errors.     Orbie Pyo, MD 07/05/18 [id number]

## 2018-07-05 NOTE — H&P (Signed)
Danielle [name] is an 62 y.o. female.   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  The patient denies seeing blood in her emesis.  She was recently admitted for small bowel obstruction and returns with similar symptoms.  The emergency department staff talked with the surgical service who recommended NG tube placement.  The patient is now more comfortable but needed further evaluation of her ostomy sites and relief of her nausea and vomiting which prompted the emergency department staff to call the hospitalist service for admission.  Past Medical History:  Diagnosis Date  . Anxiety   . Arthritis   . Cancer ([place]) left    breast cancer 2000, chemo tx's with total mastectomy and lymph nodes resected.   . Cancer of right lung ([place]) 02/21/2016   rad tx's.   . CHF (congestive heart failure) ([place])   . COPD (chronic obstructive pulmonary disease) ([place])   . Dependence on supplemental oxygen   . Depression   . Diabetes mellitus without complication ([place])   . Heart murmur   . Hypertension   . Lung nodule   . Lymphedema   . Shortness of breath dyspnea    with exertion  . Status post chemotherapy 2001   left breast cancer  . Status post radiation therapy 2001   left breast cancer    Past Surgical History:  Procedure Laterality Date  . Breast Biospy Left    [facility]  . BREAST SURGERY    . COLONOSCOPY N/A 04/30/2018   Procedure: COLONOSCOPY;  Surgeon: Virgel Manifold, MD;  Location: [facility] ENDOSCOPY;  Service: Endoscopy;  Laterality: N/A;  . DILATION AND CURETTAGE OF UTERUS    . ELECTROMAGNETIC NAVIGATION BROCHOSCOPY Right 04/11/2016   Procedure: ELECTROMAGNETIC NAVIGATION BRONCHOSCOPY;  Surgeon: Vilinda Boehringer, MD;  Location: [facility] ORS;  Service: Cardiopulmonary;  Laterality: Right;  . ESOPHAGOGASTRODUODENOSCOPY (EGD) WITH PROPOFOL N/A  05/07/2018   Procedure: ESOPHAGOGASTRODUODENOSCOPY (EGD) WITH PROPOFOL;  Surgeon: Lucilla Lame, MD;  Location: [facility] ENDOSCOPY;  Service: Endoscopy;  Laterality: N/A;  . history of colonoscopy]    . ILEOSTOMY    . LAPAROTOMY Right 05/04/2018   Procedure: EXPLORATORY LAPAROTOMY right colectomy right and left ostomy;  Surgeon: Herbert Pun, MD;  Location: [facility] ORS;  Service: General;  Laterality: Right;  . LUNG BIOPSY    . MASTECTOMY Left    2000, [facility]  . ROTATOR CUFF REPAIR Right    [facility]    Family History  Problem Relation Age of Onset  . Breast cancer Mother 46  . Cancer Mother        Breast   . Cirrhosis Father   . Breast cancer Paternal Aunt 61  . Cancer Maternal Aunt        Breast    Social History:  reports that she quit smoking about 6 years ago. Her smoking use included cigarettes. She has a 10.00 pack-year smoking history. She has never used smokeless tobacco. She reports that she drinks alcohol. She reports that she does not use drugs.  Allergies: No Known Allergies   (Not in a hospital admission)  Results for orders placed or performed during the hospital encounter of 07/05/18 (from the past 48 hour(s))  CBC with Differential     Status: Abnormal   Collection Time: 07/05/18 12:52 AM  Result Value Ref Range   WBC 10.6 3.6 - 11.0 K/uL  RBC 3.69 (L) 3.80 - 5.20 MIL/uL   Hemoglobin 11.5 (L) 12.0 - 16.0 g/dL   HCT 35.1 35.0 - 47.0 %   MCV 94.9 80.0 - 100.0 fL   MCH 31.1 26.0 - 34.0 pg   MCHC 32.8 32.0 - 36.0 g/dL   RDW 15.9 (H) 11.5 - 14.5 %   Platelets 378 150 - 440 K/uL   Neutrophils Relative % 60 %   Neutro Abs 6.3 1.4 - 6.5 K/uL   Lymphocytes Relative 33 %   Lymphs Abs 3.5 1.0 - 3.6 K/uL   Monocytes Relative 5 %   Monocytes Absolute 0.5 0.2 - 0.9 K/uL   Eosinophils Relative 1 %   Eosinophils Absolute 0.1 0 - 0.7 K/uL   Basophils Relative 1 %   Basophils Absolute 0.1 0 - 0.1 K/uL    Comment: Performed at [facility], [address]  [place], [place] [address]  Comprehensive metabolic panel     Status: Abnormal   Collection Time: 07/05/18 12:52 AM  Result Value Ref Range   Sodium 138 135 - 145 mmol/L   Potassium 5.4 (H) 3.5 - 5.1 mmol/L    Comment: HEMOLYSIS AT THIS LEVEL MAY AFFECT RESULT   Chloride 112 (H) 98 - 111 mmol/L   CO2 15 (L) 22 - 32 mmol/L   Glucose, Bld 112 (H) 70 - 99 mg/dL   BUN 11 8 - 23 mg/dL   Creatinine, Ser 1.71 (H) 0.44 - 1.00 mg/dL   Calcium 10.1 8.9 - 10.3 mg/dL   Total Protein 8.4 (H) 6.5 - 8.1 g/dL   Albumin 4.3 3.5 - 5.0 g/dL   AST 37 15 - 41 U/L    Comment: HEMOLYSIS AT THIS LEVEL MAY AFFECT RESULT   ALT 7 0 - 44 U/L    Comment: HEMOLYSIS AT THIS LEVEL MAY AFFECT RESULT   Alkaline Phosphatase 72 38 - 126 U/L   Total Bilirubin 1.0 0.3 - 1.2 mg/dL    Comment: HEMOLYSIS AT THIS LEVEL MAY AFFECT RESULT   GFR calc non Af Amer 30 (L) >60 mL/min   GFR calc Af Amer 35 (L) >60 mL/min    Comment: (NOTE) The eGFR has been calculated using the CKD EPI equation. This calculation has not been validated in all clinical situations. eGFR's persistently <60 mL/min signify possible Chronic Kidney Disease.    Anion gap 11 5 - 15    Comment: Performed at [facility], [address] [place], [place] [address]  Lipase, blood     Status: None   Collection Time: 07/05/18 12:52 AM  Result Value Ref Range   Lipase 26 11 - 51 U/L    Comment: Performed at [facility], [place], [place] [address]   Ct Abdomen Pelvis Wo Contrast  Result Date: 07/05/2018 CLINICAL DATA:  62 year old female with acute abdominal pain. Status post right hemi colectomy with end ileostomy and mucous fistula. EXAM: CT ABDOMEN AND PELVIS WITHOUT CONTRAST TECHNIQUE: Multidetector CT imaging of the abdomen and pelvis was performed following the standard protocol without IV contrast. COMPARISON:  CT of the abdomen pelvis dated 06/29/2018 FINDINGS: Evaluation of this exam is limited in the absence of  intravenous contrast. Lower chest: The visualized lung bases are clear. Multi vessel coronary vascular calcification. There is hypoattenuation of the cardiac blood pool suggestive of a degree of anemia. Clinical correlation is recommended. No intra-abdominal free air.  Small ascites. Hepatobiliary: A 1 cm hypodense lesion in the liver is not  well characterized. No intrahepatic biliary ductal dilatation. The gallbladder is unremarkable as visualized. Pancreas: Unremarkable. No pancreatic ductal dilatation or surrounding inflammatory changes. Spleen: Normal in size without focal abnormality. Adrenals/Urinary Tract: The adrenal glands are unremarkable. A 1 cm hypodense lesion in the superior pole of the left kidney is not well characterized. There is no hydronephrosis on either side. The urinary bladder is grossly unremarkable. Stomach/Bowel: Postsurgical changes of right hemicolectomy with a left anterior abdominal wall colostomy. A right lower quadrant ileostomy is also noted. There is diffuse dilatation of fluid-filled loops of small bowel measuring up to 3.5 cm in diameter with a probable transition in the right hemiabdomen likely secondary to adhesions. Overall there has been interval progression of the small bowel dilatation since the prior CT. Vascular/Lymphatic: There is advanced aortoiliac atherosclerotic disease. No portal venous gas. There is no adenopathy. Reproductive: Probable uterine fibroid. Other: Midline vertical anterior abdominal wall incisional scar. Musculoskeletal: Osteopenia. Age indeterminate L4 compression fracture similar to prior CT. IMPRESSION: 1. Small-bowel obstruction with interval worsening since the prior CT and a probable transition in the right hemiabdomen. 2. Small ascites, increased in size since the prior CT. 3. Advanced Aortic Atherosclerosis (ICD10-I70.0). Electronically Signed   By: Anner Crete M.D.   On: 07/05/2018 02:09   Dg Chest 1 View  Result Date:  07/05/2018 CLINICAL DATA:  62 y/o  F; NG tube placement.  Two days of cough. EXAM: CHEST  1 VIEW COMPARISON:  06/29/2018 chest radiograph FINDINGS: Stable normal cardiac silhouette given projection and technique. Aortic atherosclerosis with calcification. Right upper lung zone stable scarring. No focal consolidation. No pleural effusion or pneumothorax. No acute osseous abnormality is evident. Enteric tube is coiling in the proximal stomach with tip projecting over the cardia. IMPRESSION: 1. Enteric tube is coiling in the proximal stomach with tip projecting over the gastric cardia. 2. No acute pulmonary process identified. 3.  Aortic Atherosclerosis (ICD10-I70.0). Electronically Signed   By: Kristine Garbe M.D.   On: 07/05/2018 04:55    Review of Systems  Constitutional: Negative for chills and fever.  HENT: Negative for sore throat and tinnitus.   Eyes: Negative for blurred vision and redness.  Respiratory: Negative for cough and shortness of breath.   Cardiovascular: Negative for chest pain, palpitations, orthopnea and PND.  Gastrointestinal: Positive for abdominal pain, nausea and vomiting. Negative for diarrhea.  Genitourinary: Negative for dysuria, frequency and urgency.  Musculoskeletal: Negative for joint pain and myalgias.  Skin: Negative for rash.       No lesions  Neurological: Negative for speech change, focal weakness and weakness.  Endo/Heme/Allergies: Does not bruise/bleed easily.       No temperature intolerance  Psychiatric/Behavioral: Negative for depression and suicidal ideas.    Blood pressure 111/77, pulse 93, temperature 98 F (36.7 C), temperature source Oral, resp. rate 18, height '5\' 7"'$  (1.702 m), weight 56.2 kg, SpO2 98 %. Physical Exam  Vitals reviewed. Constitutional: She is oriented to person, place, and time. She appears well-developed and well-nourished. No distress.  HENT:  Head: Normocephalic and atraumatic.  Mouth/Throat: Oropharynx is clear and  moist.  Eyes: Pupils are equal, round, and reactive to light. Conjunctivae and EOM are normal. No scleral icterus.  Neck: Normal range of motion. Neck supple. No JVD present. No tracheal deviation present. No thyromegaly present.  Cardiovascular: Normal rate, regular rhythm and normal heart sounds. Exam reveals no gallop and no friction rub.  No murmur heard. Respiratory: Effort normal and breath sounds normal.  GI:  Soft. Bowel sounds are normal. She exhibits no distension. There is no tenderness.  Genitourinary:  Genitourinary Comments: Deferred  Musculoskeletal: Normal range of motion. She exhibits no edema.  Lymphadenopathy:    She has no cervical adenopathy.  Neurological: She is alert and oriented to person, place, and time. No cranial nerve deficit. She exhibits normal muscle tone.  Skin: Skin is warm and dry. No rash noted. No erythema.  Psychiatric: She has a normal mood and affect. Her behavior is normal. Judgment and thought content normal.     Assessment/Plan This is a 62 year old female admitted for small bowel obstruction. 1.  SBO: Continue NG tube decompression of the bowel.  N.p.o. status for now.  Await further recommendations from surgical service. 2.  AKI: Likely secondary to dehydration/recurrent vomiting.  Hydrate with intravenous fluid.  Avoid nephrotoxic agents. 3.  COPD: Continue inhaled corticosteroid  4.  Depression: Continue Celexa 5.  DVT prophylaxis: Eliquis 6.  GI prophylaxis: None The patient is a DNR.  Time spent on admission orders and patient care approximately 45 minutes  Harrie Foreman, MD 07/05/2018, 7:31 AM

## 2018-07-05 NOTE — Consult Note (Signed)
Date of Consultation:  07/05/2018  Requesting Physician:  Rosilyn Mings, MD  Reason for Consultation:  Small bowel obstruction  History of Present Illness: Danielle [name] is a 62 y.o. female s/p exploratory laparotomy with end ileostomy and mucous fistula two months ago by Dr. Windell Moment due to persistent bleeding hepatic flexure.  She was admitted last week with small bowel obstruction which resolved with conservative measures.  She was discharged to home on 9/18 and reports that almost when she arrived at home, she started having some abdominal pain and this has worsened over the past two days and also started having nausea and vomiting. She reports that she was not having any ostomy output at that point. She presented again to the ED last night and had a CT scan showing again small bowel obstruction.  NG tube was placed and she was admitted to the medical team.    This morning, we have been asked to see the patient for management of her SBO.  She reports today that she's feeling better since the NG tube was placed and denies any worsening pain.  Her ostomy bag does have some liquid stool in the bag now.  Past Medical History: Past Medical History:  Diagnosis Date  . Anxiety   . Arthritis   . Cancer ([place]) left    breast cancer 2000, chemo tx's with total mastectomy and lymph nodes resected.   . Cancer of right lung ([place]) 02/21/2016   rad tx's.   . CHF (congestive heart failure) ([place])   . COPD (chronic obstructive pulmonary disease) ([place])   . Dependence on supplemental oxygen   . Depression   . Diabetes mellitus without complication ([place])   . Heart murmur   . Hypertension   . Lung nodule   . Lymphedema   . Shortness of breath dyspnea    with exertion  . Status post chemotherapy 2001   left breast cancer  . Status post radiation therapy 2001   left breast cancer     Past Surgical History: Past Surgical History:  Procedure Laterality Date  . Breast Biospy Left    [facility]  .  BREAST SURGERY    . COLONOSCOPY N/A 04/30/2018   Procedure: COLONOSCOPY;  Surgeon: Virgel Manifold, MD;  Location: [facility] ENDOSCOPY;  Service: Endoscopy;  Laterality: N/A;  . DILATION AND CURETTAGE OF UTERUS    . ELECTROMAGNETIC NAVIGATION BROCHOSCOPY Right 04/11/2016   Procedure: ELECTROMAGNETIC NAVIGATION BRONCHOSCOPY;  Surgeon: Vilinda Boehringer, MD;  Location: [facility] ORS;  Service: Cardiopulmonary;  Laterality: Right;  . ESOPHAGOGASTRODUODENOSCOPY (EGD) WITH PROPOFOL N/A 05/07/2018   Procedure: ESOPHAGOGASTRODUODENOSCOPY (EGD) WITH PROPOFOL;  Surgeon: Lucilla Lame, MD;  Location: [facility] ENDOSCOPY;  Service: Endoscopy;  Laterality: N/A;  . history of colonoscopy]    . ILEOSTOMY    . LAPAROTOMY Right 05/04/2018   Procedure: EXPLORATORY LAPAROTOMY right colectomy right and left ostomy;  Surgeon: Herbert Pun, MD;  Location: [facility] ORS;  Service: General;  Laterality: Right;  . LUNG BIOPSY    . MASTECTOMY Left    2000, [facility]  . ROTATOR CUFF REPAIR Right    [facility]    Home Medications: Prior to Admission medications   Medication Sig Start Date End Date Taking? Authorizing Provider  albuterol (PROVENTIL HFA;VENTOLIN HFA) 108 (90 Base) MCG/ACT inhaler Inhale 2 puffs into the lungs every 6 (six) hours as needed for wheezing or shortness of breath. 04/24/18  Yes Demetrios Loll, MD  apixaban (ELIQUIS) 5 MG TABS tablet Take 1 tablet (5 mg total) by  mouth 2 (two) times daily. 06/23/18  Yes [name], MD  budesonide-formoterol (SYMBICORT) 160-4.5 MCG/ACT inhaler Inhale 2 puffs into the lungs 2 (two) times daily. 04/24/18  Yes Demetrios Loll, MD  carvedilol (COREG) 6.25 MG tablet Take 1 tablet (6.25 mg total) by mouth 2 (two) times daily. 06/24/18  Yes Hackney, Tina A, FNP  citalopram (CELEXA) 40 MG tablet Take 40 mg by mouth daily.   Yes [provider]  ferrous sulfate 325 (65 FE) MG tablet Take 1 tablet (325 mg total) by mouth 2 (two) times daily with a meal. 07/02/18  Yes [name], MD  furosemide (LASIX) 20 MG tablet Take 20 mg by mouth daily.    Yes [provider]  HYDROcodone-acetaminophen (NORCO/VICODIN) 5-325 MG tablet Take 1 tablet by mouth every 6 (six) hours as needed for moderate pain or severe pain.   Yes [provider]  megestrol (MEGACE) 400 MG/10ML suspension Take 7.5 mLs (300 mg total) by mouth 2 (two) times daily. 06/05/18  Yes Fritzi Mandes, MD  Multiple Vitamin (MULTIVITAMIN WITH MINERALS) TABS tablet Take 1 tablet by mouth daily. 06/06/18  Yes Fritzi Mandes, MD    Allergies: No Known Allergies  Social History:  reports that she quit smoking about 6 years ago. Her smoking use included cigarettes. She has a 10.00 pack-year smoking history. She has never used smokeless tobacco. She reports that she drinks alcohol. She reports that she does not use drugs.   Family History: Family History  Problem Relation Age of Onset  . Breast cancer Mother 45  . Cancer Mother        Breast   . Cirrhosis Father   . Breast cancer Paternal Aunt 54  . Cancer Maternal Aunt        Breast     Review of Systems: Review of Systems  Constitutional: Negative for chills and fever.  HENT: Negative for hearing loss.   Eyes: Negative for blurred vision.  Respiratory: Negative for shortness of breath.   Cardiovascular: Negative for chest pain.  Gastrointestinal: Positive for abdominal pain, nausea and vomiting. Negative for constipation and diarrhea.  Genitourinary: Negative for dysuria.  Musculoskeletal: Negative for myalgias.  Skin: Negative for rash.  Neurological: Negative for dizziness.  Psychiatric/Behavioral: Negative for depression.    Physical Exam BP 120/90 (BP Location: Right Arm)   Pulse 95   Temp 98.1 F (36.7 C) (Oral)   Resp 18   Ht 5\' 7"  (1.702 m)   Wt 56.2 kg   SpO2 98%   BMI 19.42 kg/m  CONSTITUTIONAL: No acute distress HEENT:  Normocephalic, atraumatic, extraocular motion intact. NECK: Trachea is midline, and  there is no jugular venous distension. RESPIRATORY:  Lungs are clear, and breath sounds are equal bilaterally. Normal respiratory effort without pathologic use of accessory muscles. CARDIOVASCULAR: Heart is regular without murmurs, gallops, or rubs. GI: The abdomen is soft, mildly distended, nontender to palpation.  RLQ end ileostomy is pink and viable with liquid stool in bag, not much gas.  LUQ mucous fistula in place as well. There were no palpable masses.  MUSCULOSKELETAL:  Normal muscle strength and tone in all four extremities.  No peripheral edema or cyanosis. SKIN: Skin turgor is normal. There are no pathologic skin lesions.  NEUROLOGIC:  Motor and sensation is grossly normal.  Cranial nerves are grossly intact. PSYCH:  Alert and oriented to person, place and time. Affect is normal.  Laboratory Analysis: Results for orders placed or performed during the hospital encounter  of 07/05/18 (from the past 24 hour(s))  CBC with Differential     Status: Abnormal   Collection Time: 07/05/18 12:52 AM  Result Value Ref Range   WBC 10.6 3.6 - 11.0 K/uL   RBC 3.69 (L) 3.80 - 5.20 MIL/uL   Hemoglobin 11.5 (L) 12.0 - 16.0 g/dL   HCT 35.1 35.0 - 47.0 %   MCV 94.9 80.0 - 100.0 fL   MCH 31.1 26.0 - 34.0 pg   MCHC 32.8 32.0 - 36.0 g/dL   RDW 15.9 (H) 11.5 - 14.5 %   Platelets 378 150 - 440 K/uL   Neutrophils Relative % 60 %   Neutro Abs 6.3 1.4 - 6.5 K/uL   Lymphocytes Relative 33 %   Lymphs Abs 3.5 1.0 - 3.6 K/uL   Monocytes Relative 5 %   Monocytes Absolute 0.5 0.2 - 0.9 K/uL   Eosinophils Relative 1 %   Eosinophils Absolute 0.1 0 - 0.7 K/uL   Basophils Relative 1 %   Basophils Absolute 0.1 0 - 0.1 K/uL  Comprehensive metabolic panel     Status: Abnormal   Collection Time: 07/05/18 12:52 AM  Result Value Ref Range   Sodium 138 135 - 145 mmol/L   Potassium 5.4 (H) 3.5 - 5.1 mmol/L   Chloride 112 (H) 98 - 111 mmol/L   CO2 15 (L) 22 - 32 mmol/L   Glucose, Bld 112 (H) 70 - 99 mg/dL   BUN  11 8 - 23 mg/dL   Creatinine, Ser 1.71 (H) 0.44 - 1.00 mg/dL   Calcium 10.1 8.9 - 10.3 mg/dL   Total Protein 8.4 (H) 6.5 - 8.1 g/dL   Albumin 4.3 3.5 - 5.0 g/dL   AST 37 15 - 41 U/L   ALT 7 0 - 44 U/L   Alkaline Phosphatase 72 38 - 126 U/L   Total Bilirubin 1.0 0.3 - 1.2 mg/dL   GFR calc non Af Amer 30 (L) >60 mL/min   GFR calc Af Amer 35 (L) >60 mL/min   Anion gap 11 5 - 15  Lipase, blood     Status: None   Collection Time: 07/05/18 12:52 AM  Result Value Ref Range   Lipase 26 11 - 51 U/L    Imaging: Ct Abdomen Pelvis Wo Contrast  Result Date: 07/05/2018 CLINICAL DATA:  62 year old female with acute abdominal pain. Status post right hemi colectomy with end ileostomy and mucous fistula. EXAM: CT ABDOMEN AND PELVIS WITHOUT CONTRAST TECHNIQUE: Multidetector CT imaging of the abdomen and pelvis was performed following the standard protocol without IV contrast. COMPARISON:  CT of the abdomen pelvis dated 06/29/2018 FINDINGS: Evaluation of this exam is limited in the absence of intravenous contrast. Lower chest: The visualized lung bases are clear. Multi vessel coronary vascular calcification. There is hypoattenuation of the cardiac blood pool suggestive of a degree of anemia. Clinical correlation is recommended. No intra-abdominal free air.  Small ascites. Hepatobiliary: A 1 cm hypodense lesion in the liver is not well characterized. No intrahepatic biliary ductal dilatation. The gallbladder is unremarkable as visualized. Pancreas: Unremarkable. No pancreatic ductal dilatation or surrounding inflammatory changes. Spleen: Normal in size without focal abnormality. Adrenals/Urinary Tract: The adrenal glands are unremarkable. A 1 cm hypodense lesion in the superior pole of the left kidney is not well characterized. There is no hydronephrosis on either side. The urinary bladder is grossly unremarkable. Stomach/Bowel: Postsurgical changes of right hemicolectomy with a left anterior abdominal wall  colostomy. A right lower quadrant ileostomy is  also noted. There is diffuse dilatation of fluid-filled loops of small bowel measuring up to 3.5 cm in diameter with a probable transition in the right hemiabdomen likely secondary to adhesions. Overall there has been interval progression of the small bowel dilatation since the prior CT. Vascular/Lymphatic: There is advanced aortoiliac atherosclerotic disease. No portal venous gas. There is no adenopathy. Reproductive: Probable uterine fibroid. Other: Midline vertical anterior abdominal wall incisional scar. Musculoskeletal: Osteopenia. Age indeterminate L4 compression fracture similar to prior CT. IMPRESSION: 1. Small-bowel obstruction with interval worsening since the prior CT and a probable transition in the right hemiabdomen. 2. Small ascites, increased in size since the prior CT. 3. Advanced Aortic Atherosclerosis (ICD10-I70.0). Electronically Signed   By: Anner Crete M.D.   On: 07/05/2018 02:09   Dg Chest 1 View  Result Date: 07/05/2018 CLINICAL DATA:  62 y/o  F; NG tube placement.  Two days of cough. EXAM: CHEST  1 VIEW COMPARISON:  06/29/2018 chest radiograph FINDINGS: Stable normal cardiac silhouette given projection and technique. Aortic atherosclerosis with calcification. Right upper lung zone stable scarring. No focal consolidation. No pleural effusion or pneumothorax. No acute osseous abnormality is evident. Enteric tube is coiling in the proximal stomach with tip projecting over the cardia. IMPRESSION: 1. Enteric tube is coiling in the proximal stomach with tip projecting over the gastric cardia. 2. No acute pulmonary process identified. 3.  Aortic Atherosclerosis (ICD10-I70.0). Electronically Signed   By: Kristine Garbe M.D.   On: 07/05/2018 04:55    Assessment and Plan: This is a 62 y.o. female with small bowel obstruction.  I have independently viewed the patient's imaging studies and reviewed her laboratory studies.  Overall, she  does have another episode of SBO with dilated loops of small bowel.  No perforation.  Her creatinine is elevated and shows signs of dehydration.  Discussed with the patient that she has a recurrent episode of small bowel obstruction.  Discussed role for conservative management again with NG tube to suction, NPO diet, and IV fluid hydration.  Currently there is some signs of ostomy function with liquid stool, though no gas.  She's feeling symptomatically better this morning.  Did discuss with patient that if there is no improvement, she may require surgery but for now, will attempt conservative management and if she improves, would advance diet very slowly.  Will discuss further with Dr. Windell Moment and Dr. Lysle Pearl next week.  Face-to-face time spent with the patient and care providers was 80 minutes, with more than 50% of the time spent counseling, educating, and coordinating care of the patient.     Melvyn Neth, MD [facility]g:  [phone]

## 2018-07-06 ENCOUNTER — Inpatient Hospital Stay: Payer: Medicaid Other

## 2018-07-06 LAB — CBC
HCT: 24.6 % — ABNORMAL LOW (ref 35.0–47.0)
Hemoglobin: 8.2 g/dL — ABNORMAL LOW (ref 12.0–16.0)
MCH: 31.6 pg (ref 26.0–34.0)
MCHC: 33.2 g/dL (ref 32.0–36.0)
MCV: 95 fL (ref 80.0–100.0)
PLATELETS: 283 10*3/uL (ref 150–440)
RBC: 2.59 MIL/uL — AB (ref 3.80–5.20)
RDW: 15.7 % — ABNORMAL HIGH (ref 11.5–14.5)
WBC: 8.4 10*3/uL (ref 3.6–11.0)

## 2018-07-06 LAB — BASIC METABOLIC PANEL
ANION GAP: 5 (ref 5–15)
BUN: 14 mg/dL (ref 8–23)
CO2: 16 mmol/L — ABNORMAL LOW (ref 22–32)
Calcium: 8.5 mg/dL — ABNORMAL LOW (ref 8.9–10.3)
Chloride: 118 mmol/L — ABNORMAL HIGH (ref 98–111)
Creatinine, Ser: 1.06 mg/dL — ABNORMAL HIGH (ref 0.44–1.00)
GFR calc Af Amer: >60 mL/min (ref >60)
GFR, EST NON AFRICAN AMERICAN: 54 mL/min — AB (ref >60)
GLUCOSE: 65 mg/dL — AB (ref 70–99)
POTASSIUM: 3.6 mmol/L (ref 3.5–5.1)
SODIUM: 139 mmol/L (ref 135–145)

## 2018-07-06 MED ORDER — SODIUM CHLORIDE 0.9 % IV SOLN
INTRAVENOUS | Status: AC
  Administered 2018-07-06 (×2): via INTRAVENOUS

## 2018-07-06 NOTE — Progress Notes (Signed)
07/06/2018  Subjective: No acute events overnight.  Patient is having some ostomy function, and feels better this morning.  She did have one episode of mild pain but it resolved.    Vital signs: Temp:  [98.4 F (36.9 C)-98.5 F (36.9 C)] 98.4 F (36.9 C) (09/22 0535) Pulse Rate:  [83-91] 90 (09/22 0535) Resp:  [16-18] 18 (09/22 0535) BP: (118-142)/(71-93) 142/91 (09/22 0535) SpO2:  [98 %-100 %] 99 % (09/22 0535)   Intake/Output: 09/21 0701 - 09/22 0700 In: 2631.1 [I.V.:2631.1] Out: 1900 [Emesis/NG output:625; LDJTT:0177] Last BM Date: 07/06/18  Physical Exam: Constitutional: No acute distress Abdomen:  Soft, non-distended, non-tender to palpation.  End ileostomy with better stool output and viable stoma.  Labs:  Recent Labs    07/05/18 0052 07/06/18 0528  WBC 10.6 8.4  HGB 11.5* 8.2*  HCT 35.1 24.6*  PLT 378 283   Recent Labs    07/05/18 0052 07/06/18 0528  NA 138 139  K 5.4* 3.6  CL 112* 118*  CO2 15* 16*  GLUCOSE 112* 65*  BUN 11 14  CREATININE 1.71* 1.06*  CALCIUM 10.1 8.5*   No results for input(s): LABPROT, INR in the last 72 hours.  Imaging: Dg Abd 1 View  Result Date: 07/06/2018 CLINICAL DATA:  Small bowel obstruction EXAM: ABDOMEN - 1 VIEW COMPARISON:  CT abdomen/pelvis dated 07/05/2018 FINDINGS: Enteric tube terminates in the proximal stomach. Mildly dilated loops of small bowel in the central abdomen, compatible with small bowel obstruction. Tubal ligation clips overlying the pelvis. IMPRESSION: Enteric tube terminates in the proximal stomach. Mildly dilated loops of small bowel in the central abdomen, compatible with small bowel obstruction. Electronically Signed   By: Julian Hy M.D.   On: 07/06/2018 08:08    Assessment/Plan: This is a 62 y.o. female with small bowel obstruction.  Have independently viewed the patient's KUB today, which shows some dilated loops of small bowel.    --Given improved ostomy function and not as much NG output  overnight, will attempt clamping trial this morning.  NG tube clamped at 9:45 am, and will reassess residuals after 4 hours.  If less than 150 ml, then may remove NG tube.  --Given some dilated loops still, if NG tube is removed, would still keep NPO today and start clears tomorrow.   Melvyn Neth, [facility]

## 2018-07-06 NOTE — Progress Notes (Signed)
[facility] [place] NAME: Margaret Cockerill    MR#:  [id number]  DATE OF BIRTH:  12-22-1955  SUBJECTIVE:  CHIEF COMPLAINT:   Chief Complaint  Patient presents with  . Abdominal Pain  . Emesis  Patient seen and evaluated today Has NG tube with suction Has ileostomy with liquid stool Decreased abdominal pain  REVIEW OF SYSTEMS:    ROS  CONSTITUTIONAL: No documented fever. No fatigue, weakness. No weight gain, no weight loss.  EYES: No blurry or double vision.  ENT: No tinnitus. No postnasal drip. No redness of the oropharynx.  RESPIRATORY: No cough, no wheeze, no hemoptysis. No dyspnea.  CARDIOVASCULAR: No chest pain. No orthopnea. No palpitations. No syncope.  GASTROINTESTINAL: No nausea, no vomiting or diarrhea.  Mild abdominal pain. No melena or hematochezia.  Has ileostomy GENITOURINARY: No dysuria or hematuria.  ENDOCRINE: No polyuria or nocturia. No heat or cold intolerance.  HEMATOLOGY: No anemia. No bruising. No bleeding.  INTEGUMENTARY: No rashes. No lesions.  MUSCULOSKELETAL: No arthritis. No swelling. No gout.  NEUROLOGIC: No numbness, tingling, or ataxia. No seizure-type activity.  PSYCHIATRIC: No anxiety. No insomnia. No ADD.   DRUG ALLERGIES:  No Known Allergies  VITALS:  Blood pressure (!) 142/91, pulse 90, temperature 98.4 F (36.9 C), temperature source Oral, resp. rate 18, height 5\' 7"  (1.702 m), weight 56.2 kg, SpO2 99 %.  PHYSICAL EXAMINATION:   Physical Exam  GENERAL:  62 y.o.-year-old patient lying in the bed with no acute distress.  EYES: Pupils equal, round, reactive to light and accommodation. No scleral icterus. Extraocular muscles intact.  HEENT: Head atraumatic, normocephalic. Oropharynx and nasopharynx clear.  NECK:  Supple, no jugular venous distention. No thyroid enlargement, no tenderness.  LUNGS: Normal breath sounds bilaterally, no wheezing, rales, rhonchi. No use of accessory muscles of  respiration.  CARDIOVASCULAR: S1, S2 normal. No murmurs, rubs, or gallops.  ABDOMEN: Soft, mild tenderness around umbilicus, mild distension. Bowel sounds decreased. No organomegaly or mass.  Ileostomy noted EXTREMITIES: No cyanosis, clubbing or edema b/l.    NEUROLOGIC: Cranial nerves II through XII are intact. No focal Motor or sensory deficits b/l.   PSYCHIATRIC: The patient is alert and oriented x 3.  SKIN: No obvious rash, lesion, or ulcer.   LABORATORY PANEL:   CBC Recent Labs  Lab 07/06/18 0528  WBC 8.4  HGB 8.2*  HCT 24.6*  PLT 283   ------------------------------------------------------------------------------------------------------------------ Chemistries  Recent Labs  Lab 07/05/18 0052 07/06/18 0528  NA 138 139  K 5.4* 3.6  CL 112* 118*  CO2 15* 16*  GLUCOSE 112* 65*  BUN 11 14  CREATININE 1.71* 1.06*  CALCIUM 10.1 8.5*  AST 37  --   ALT 7  --   ALKPHOS 72  --   BILITOT 1.0  --    ------------------------------------------------------------------------------------------------------------------  Cardiac Enzymes No results for input(s): TROPONINI in the last 168 hours. ------------------------------------------------------------------------------------------------------------------  RADIOLOGY:  Ct Abdomen Pelvis Wo Contrast  Result Date: 07/05/2018 CLINICAL DATA:  62 year old female with acute abdominal pain. Status post right hemi colectomy with end ileostomy and mucous fistula. EXAM: CT ABDOMEN AND PELVIS WITHOUT CONTRAST TECHNIQUE: Multidetector CT imaging of the abdomen and pelvis was performed following the standard protocol without IV contrast. COMPARISON:  CT of the abdomen pelvis dated 06/29/2018 FINDINGS: Evaluation of this exam is limited in the absence of intravenous contrast. Lower chest: The visualized lung bases are clear. Multi vessel coronary vascular calcification. There is hypoattenuation of the cardiac  blood pool suggestive of a degree of  anemia. Clinical correlation is recommended. No intra-abdominal free air.  Small ascites. Hepatobiliary: A 1 cm hypodense lesion in the liver is not well characterized. No intrahepatic biliary ductal dilatation. The gallbladder is unremarkable as visualized. Pancreas: Unremarkable. No pancreatic ductal dilatation or surrounding inflammatory changes. Spleen: Normal in size without focal abnormality. Adrenals/Urinary Tract: The adrenal glands are unremarkable. A 1 cm hypodense lesion in the superior pole of the left kidney is not well characterized. There is no hydronephrosis on either side. The urinary bladder is grossly unremarkable. Stomach/Bowel: Postsurgical changes of right hemicolectomy with a left anterior abdominal wall colostomy. A right lower quadrant ileostomy is also noted. There is diffuse dilatation of fluid-filled loops of small bowel measuring up to 3.5 cm in diameter with a probable transition in the right hemiabdomen likely secondary to adhesions. Overall there has been interval progression of the small bowel dilatation since the prior CT. Vascular/Lymphatic: There is advanced aortoiliac atherosclerotic disease. No portal venous gas. There is no adenopathy. Reproductive: Probable uterine fibroid. Other: Midline vertical anterior abdominal wall incisional scar. Musculoskeletal: Osteopenia. Age indeterminate L4 compression fracture similar to prior CT. IMPRESSION: 1. Small-bowel obstruction with interval worsening since the prior CT and a probable transition in the right hemiabdomen. 2. Small ascites, increased in size since the prior CT. 3. Advanced Aortic Atherosclerosis (ICD10-I70.0). Electronically Signed   By: Anner Crete M.D.   On: 07/05/2018 02:09   Dg Chest 1 View  Result Date: 07/05/2018 CLINICAL DATA:  62 y/o  F; NG tube placement.  Two days of cough. EXAM: CHEST  1 VIEW COMPARISON:  06/29/2018 chest radiograph FINDINGS: Stable normal cardiac silhouette given projection and  technique. Aortic atherosclerosis with calcification. Right upper lung zone stable scarring. No focal consolidation. No pleural effusion or pneumothorax. No acute osseous abnormality is evident. Enteric tube is coiling in the proximal stomach with tip projecting over the cardia. IMPRESSION: 1. Enteric tube is coiling in the proximal stomach with tip projecting over the gastric cardia. 2. No acute pulmonary process identified. 3.  Aortic Atherosclerosis (ICD10-I70.0). Electronically Signed   By: Kristine Garbe M.D.   On: 07/05/2018 04:55   Dg Abd 1 View  Result Date: 07/06/2018 CLINICAL DATA:  Small bowel obstruction EXAM: ABDOMEN - 1 VIEW COMPARISON:  CT abdomen/pelvis dated 07/05/2018 FINDINGS: Enteric tube terminates in the proximal stomach. Mildly dilated loops of small bowel in the central abdomen, compatible with small bowel obstruction. Tubal ligation clips overlying the pelvis. IMPRESSION: Enteric tube terminates in the proximal stomach. Mildly dilated loops of small bowel in the central abdomen, compatible with small bowel obstruction. Electronically Signed   By: Julian Hy M.D.   On: 07/06/2018 08:08     ASSESSMENT AND PLAN:   62 year old female patient with history of hemicolectomy, enteric fistula currently under hospitalist service for recurrent bowel obstruction  -Small bowel obstruction Daily abdominal x-rays Surgery follow-up Continue NG tube with suction Currently n.p.o.  -Acute hyperkalemia improved  -Dehydration resolving IV fluids  -COPD chronic Continue oxygen by nasal cannula and home dose inhalers  -DVT prophylaxis Sequential compression device to lower extremities Eliquis on hold in view of bowel obstruction or possible surgery if obstruction does not resolve conservatively  All the records are reviewed and case discussed with Care Management/Social Worker. Management plans discussed with the patient, family and they are in agreement.  CODE  STATUS: DNR  DVT Prophylaxis: SCDs  TOTAL TIME TAKING CARE OF THIS PATIENT: 34 minutes.  POSSIBLE D/C IN 2 to 3 DAYS, DEPENDING ON CLINICAL CONDITION.  Saundra Shelling M.D on 07/06/2018 at 10:59 AM  Between 7am to 6pm - Pager - [phone]  After 6pm go to [email or web address] - password [email or web address] [facility]  Office  [phone]  CC: Primary care physician; Elisabeth Cara, NP  Note: This dictation was prepared with Dragon dictation along with smaller phrase technology. Any transcriptional errors that result from this process are unintentional.

## 2018-07-06 NOTE — Progress Notes (Signed)
Initial Nutrition Assessment  DOCUMENTATION CODES:   Non-severe (moderate) malnutrition in context of chronic illness  INTERVENTION:  Will monitor for diet advancement.  Once diet able to be advanced patient would benefit from ONS (she has previously enjoyed butter pecan Nepro) and an MVI.  Patient will be at risk for refeeding once started on a diet or any dextrose-containing IV fluids. Once started on one of these, recommend monitoring potassium, phosphorus, and magnesium and replacing as needed.  NUTRITION DIAGNOSIS:   Moderate Malnutrition related to chronic illness(remote hx breast cancer s/p chemo/XRT, hx lung cancer s/p XRT, COPD, CHF, exploratory laparotomy on 7/21 with right hemicolectomy, end ileostomy, mucous fistula creation) as evidenced by moderate fat depletion, mild muscle depletion, moderate muscle depletion.  GOAL:   Patient will meet greater than or equal to 90% of their needs  MONITOR:   Diet advancement, Labs, Weight trends, I & O's  REASON FOR ASSESSMENT:   Malnutrition Screening Tool    ASSESSMENT:   62 year old female with PMHx of left breast cancer s/p left mastectomy and chemo/XRT, arthritis, HTN, COPD, anxiety, depression, lymphedema, hx lung cancer s/p XRT, CHF, DM, hx hepatic flexure bleeding ulcer s/p exploratory laparotomy with right hemicolectomy, end ileostomy, and mucous fistula creation on 7/21, recent admission 9/15-9/18 for SBO that resolved with conservative measures, now admitted with recurrent SBO.   Met with patient at bedside. She is known to this RD from previous admission where she required TPN 7/19-8/3 and was intubated 7/21-7/25 due to acute respiratory failure following operation. On that admission patient became volume overloaded from TPN; she gained 25 lbs, developed SOB and required a reduced TPN rate and diuresis. She was also noncompliant with nutrition supplements during that admission. She did have improvement of her appetite at  discharge, but was still not eating adequately. Patient has previously reported she was not taking ONS at home. RD asked patient about appetite/intake PTA but she did not answer any questions. Patient is lethargic/sleepy at time of RD assessment and not able to provide much history today.  Per review of notes from current admission she began having abdominal pain after arriving home on 9/18 that worsened over 2 days and patient also had N/V and was not having any ostomy output. Patient is now having output from her ostomy.  Only thing patient able to report today is that her UBW was 148 lbs. Per chart she was 148 lbs (67.3 kg) on 08/24/2016. Weight has fluctuated significantly with volume over recent admissions. She was 70.5 kg on 05/17/2018 (weight falsely elevated from fluid) and lost down to 51.8 kg on 07/02/2018. Patient was 56.2 kg on admission (124 lbs) and RD obtained bed scale weight of 57.5 kg today (126.76 lbs).  Patient with 14 Fr. NGT currently clamped per chart; was previously to LIS with approximately 500 mL output present at time of RD assessment  Medications reviewed and include: NS at 125 mL/hr.  Labs reviewed: Chloride 118, CO2 16, Creatinine 1.06.  I/O: UOP unmeasured but 2 occurrences yesterday; 1275 mL output from ileostomy; 625 mL output from NGT yesterday  NUTRITION - FOCUSED PHYSICAL EXAM:    Most Recent Value  Orbital Region  Moderate depletion  Upper Arm Region  Moderate depletion  Thoracic and Lumbar Region  Moderate depletion  Buccal Region  Moderate depletion  Temple Region  Moderate depletion  Clavicle Bone Region  Mild depletion  Clavicle and Acromion Bone Region  Mild depletion  Scapular Bone Region  Moderate depletion  Dorsal Hand  Moderate depletion  Patellar Region  Moderate depletion  Anterior Thigh Region  Moderate depletion  Posterior Calf Region  Moderate depletion  Edema (RD Assessment)  None  Hair  Reviewed  Eyes  Unable to assess  Mouth  Unable to  assess  Skin  Reviewed  Nails  Reviewed     Diet Order:   Diet Order            Diet NPO time specified Except for: Sips with Meds  Diet effective now              EDUCATION NEEDS:   Not appropriate for education at this time  Skin:  Skin Assessment: Reviewed RN Assessment  Last BM:  07/06/2018 - type 7 in ileostomy  Height:   Ht Readings from Last 1 Encounters:  07/05/18 _0  (1.702 m)    Weight:   Wt Readings from Last 1 Encounters:  07/06/18 57.5 kg    Ideal Body Weight:  61.4 kg  BMI:  Body mass index is 19.85 kg/m.  Estimated Nutritional Needs:   Kcal:  1510-1745 (MSJ x 1.3-1.5)  Protein:  75-85 grams (1.3-1.5 grams/kg)  Fluid:  1.5 L/day (25 ml/kg)  Willey Blade, MS, RD, LDN Office: [phone] Pager: [phone] After Hours/Weekend Pager: [phone]

## 2018-07-06 NOTE — Progress Notes (Signed)
LCSW met and introduced myself to patient and asked if I could ask questions to complete an assessment. Patient was sleeping and stated she wanted to rest. LCSW explained and handed her a resource list in the community to assist her with emergency food/medication support and asked her if she needed any other resources. She nodded her head no and went back to sleep. Patient was encouraged to ask her nurse if she needed any further SW assistance.  [employer] LCSW [phone]

## 2018-07-07 ENCOUNTER — Inpatient Hospital Stay: Payer: Medicaid Other

## 2018-07-07 LAB — BASIC METABOLIC PANEL
Anion gap: 6 (ref 5–15)
BUN: 11 mg/dL (ref 8–23)
CO2: 16 mmol/L — ABNORMAL LOW (ref 22–32)
CREATININE: 0.82 mg/dL (ref 0.44–1.00)
Calcium: 8.3 mg/dL — ABNORMAL LOW (ref 8.9–10.3)
Chloride: 119 mmol/L — ABNORMAL HIGH (ref 98–111)
GFR calc non Af Amer: >60 mL/min (ref >60)
Glucose, Bld: 28 mg/dL — CL (ref 70–99)
Potassium: 3.7 mmol/L (ref 3.5–5.1)
SODIUM: 141 mmol/L (ref 135–145)

## 2018-07-07 LAB — GLUCOSE, CAPILLARY
Glucose-Capillary: 135 mg/dL — ABNORMAL HIGH (ref 70–99)
Glucose-Capillary: 21 mg/dL — CL (ref 70–99)

## 2018-07-07 MED ORDER — DEXTROSE 50 % IV SOLN
INTRAVENOUS | Status: AC
  Filled 2018-07-07: qty 50

## 2018-07-07 MED ORDER — DEXTROSE 50 % IV SOLN
INTRAVENOUS | Status: AC
  Administered 2018-07-07: 07:00:00
  Filled 2018-07-07: qty 50

## 2018-07-07 MED ORDER — DEXTROSE 50 % IV SOLN: 1.0000 | Freq: Once | INTRAVENOUS | Status: DC

## 2018-07-07 MED ORDER — DEXTROSE-NACL 5-0.9 % IV SOLN
INTRAVENOUS | Status: DC
  Administered 2018-07-07 (×2): via INTRAVENOUS

## 2018-07-07 NOTE — Progress Notes (Addendum)
Subjective:  CC:  Danielle [name] is a 62 y.o. female  Hospital stay day 2,   recurrent SBO.  HPI: Doing better today.  Tolerated clears for breakfast after NG removed last night.  Continues to have ostomy output.  Denies any pain.  ROS:  A 5 point review of systems was performed and pertinent positives and negatives noted in HPI.   Objective:      Temp:  [98.2 F (36.8 C)-98.7 F (37.1 C)] 98.5 F (36.9 C) (09/23 1202) Pulse Rate:  [76-83] 79 (09/23 1202) Resp:  [16-17] 17 (09/23 1202) BP: (123-131)/(70-84) 123/70 (09/23 1202) SpO2:  [98 %-100 %] 100 % (09/23 1202)     Height: 5\' 7"  (170.2 cm) Weight: 57.5 kg(bed scale) BMI (Calculated): 19.85   Intake/Output this shift:  Total I/O In: -  Out: 250 [Stool:250]       Constitutional :  alert, cooperative, appears stated age and no distress  Respiratory:  clear to auscultation bilaterally  Gastrointestinal: soft, non-tender; bowel sounds normal; no masses,  no organomegaly.  Ostomy and dressings in place.  Skin: Cool and moist.   Psychiatric: Normal affect, non-agitated, not confused  Musculoskeletal: Steady gait, full ROM    LABS:  CMP Latest Ref Rng & Units 07/07/2018 07/06/2018 07/05/2018  Glucose 70 - 99 mg/dL 28(LL) 65(L) 112(H)  BUN 8 - 23 mg/dL 11 14 11   Creatinine 0.44 - 1.00 mg/dL 0.82 1.06(H) 1.71(H)  Sodium 135 - 145 mmol/L 141 139 138  Potassium 3.5 - 5.1 mmol/L 3.7 3.6 5.4(H)  Chloride 98 - 111 mmol/L 119(H) 118(H) 112(H)  CO2 22 - 32 mmol/L 16(L) 16(L) 15(L)  Calcium 8.9 - 10.3 mg/dL 8.3(L) 8.5(L) 10.1  Total Protein 6.5 - 8.1 g/dL - - 8.4(H)  Total Bilirubin 0.3 - 1.2 mg/dL - - 1.0  Alkaline Phos 38 - 126 U/L - - 72  AST 15 - 41 U/L - - 37  ALT 0 - 44 U/L - - 7   CBC Latest Ref Rng & Units 07/06/2018 07/05/2018 07/02/2018  WBC 3.6 - 11.0 K/uL 8.4 10.6 6.9  Hemoglobin 12.0 - 16.0 g/dL 8.2(L) 11.5(L) 7.8(L)  Hematocrit 35.0 - 47.0 % 24.6(L) 35.1 23.4(L)  Platelets 150 - 440 K/uL 283 378 262     RADS: CLINICAL DATA:  Bowel obstruction.  EXAM: ABDOMEN - 1 VIEW  COMPARISON:  07/06/2018 and CT abdomen pelvis 07/05/2018.  FINDINGS: Mild residual gaseous distention of small bowel. Gas and stool are seen in the rectosigmoid colon. Right lower quadrant ileostomy. No unexpected radiopaque calculi. Lung bases are clear.  IMPRESSION: Mild residual small bowel dilatation.   Electronically Signed   By: Lorin Picket M.D.   On: 07/07/2018 07:59 Assessment:   Recurrent SBO, s/p ex-lap, end ileostomy, mucous fistula. Improving well with conservative management.    Will stay on clears for today and then advance diet tomorrow if does ok.  Then likely ok to d/c tomorrow afternoon.  Please hold eliquis for one more day in case patient unable to tolerate regular diet.

## 2018-07-07 NOTE — Progress Notes (Signed)
Lab notified BS 28, rechecked  At 0713, 21, 0715 1 amp D50 given, 0720 BS rechecked 135, Dr Estanislado Pandy notified, orders given

## 2018-07-07 NOTE — Progress Notes (Signed)
[facility] [place] NAME: Danielle [name]    MR#:  [id number]  DATE OF BIRTH:  [birth year]  SUBJECTIVE:  CHIEF COMPLAINT:   Chief Complaint  Patient presents with  . Abdominal Pain  . Emesis  Patient seen and evaluated today Off NGT Has ileostomy with liquid stool No abdominal pain No nausea and vomitings  REVIEW OF SYSTEMS:    ROS  CONSTITUTIONAL: No documented fever. No fatigue, weakness. No weight gain, no weight loss.  EYES: No blurry or double vision.  ENT: No tinnitus. No postnasal drip. No redness of the oropharynx.  RESPIRATORY: No cough, no wheeze, no hemoptysis. No dyspnea.  CARDIOVASCULAR: No chest pain. No orthopnea. No palpitations. No syncope.  GASTROINTESTINAL: No nausea, no vomiting or diarrhea.  No abdominal pain. No melena or hematochezia.  Has ileostomy GENITOURINARY: No dysuria or hematuria.  ENDOCRINE: No polyuria or nocturia. No heat or cold intolerance.  HEMATOLOGY: No anemia. No bruising. No bleeding.  INTEGUMENTARY: No rashes. No lesions.  MUSCULOSKELETAL: No arthritis. No swelling. No gout.  NEUROLOGIC: No numbness, tingling, or ataxia. No seizure-type activity.  PSYCHIATRIC: No anxiety. No insomnia. No ADD.   DRUG ALLERGIES:  No Known Allergies  VITALS:  Blood pressure 131/77, pulse 76, temperature 98.2 F (36.8 C), temperature source Oral, resp. rate 17, height 5\' 7"  (1.702 m), weight 57.5 kg, SpO2 98 %.  PHYSICAL EXAMINATION:   Physical Exam  GENERAL:  62 y.o.-year-old patient lying in the bed with no acute distress.  EYES: Pupils equal, round, reactive to light and accommodation. No scleral icterus. Extraocular muscles intact.  HEENT: Head atraumatic, normocephalic. Oropharynx and nasopharynx clear.  NECK:  Supple, no jugular venous distention. No thyroid enlargement, no tenderness.  LUNGS: Normal breath sounds bilaterally, no wheezing, rales, rhonchi. No use of accessory muscles of respiration.   CARDIOVASCULAR: S1, S2 normal. No murmurs, rubs, or gallops.  ABDOMEN: Soft,decreased tenderness around umbilicus, mild distension. Bowel sounds present. No organomegaly or mass.  Ileostomy noted EXTREMITIES: No cyanosis, clubbing or edema b/l.    NEUROLOGIC: Cranial nerves II through XII are intact. No focal Motor or sensory deficits b/l.   PSYCHIATRIC: The patient is alert and oriented x 3.  SKIN: No obvious rash, lesion, or ulcer.   LABORATORY PANEL:   CBC Recent Labs  Lab 07/06/18 0528  WBC 8.4  HGB 8.2*  HCT 24.6*  PLT 283   ------------------------------------------------------------------------------------------------------------------ Chemistries  Recent Labs  Lab 07/05/18 0052  07/07/18 0543  NA 138   < > 141  K 5.4*   < > 3.7  CL 112*   < > 119*  CO2 15*   < > 16*  GLUCOSE 112*   < > 28*  BUN 11   < > 11  CREATININE 1.71*   < > 0.82  CALCIUM 10.1   < > 8.3*  AST 37  --   --   ALT 7  --   --   ALKPHOS 72  --   --   BILITOT 1.0  --   --    < > = values in this interval not displayed.   ------------------------------------------------------------------------------------------------------------------  Cardiac Enzymes No results for input(s): TROPONINI in the last 168 hours. ------------------------------------------------------------------------------------------------------------------  RADIOLOGY:  Dg Abd 1 View  Result Date: 07/07/2018 CLINICAL DATA:  Bowel obstruction. EXAM: ABDOMEN - 1 VIEW COMPARISON:  07/06/2018 and CT abdomen pelvis 07/05/2018. FINDINGS: Mild residual gaseous distention of small bowel. Gas and stool are seen in  the rectosigmoid colon. Right lower quadrant ileostomy. No unexpected radiopaque calculi. Lung bases are clear. IMPRESSION: Mild residual small bowel dilatation. Electronically Signed   By: Lorin Picket M.D.   On: 07/07/2018 07:59   Dg Abd 1 View  Result Date: 07/06/2018 CLINICAL DATA:  Small bowel obstruction EXAM: ABDOMEN  - 1 VIEW COMPARISON:  CT abdomen/pelvis dated 07/05/2018 FINDINGS: Enteric tube terminates in the proximal stomach. Mildly dilated loops of small bowel in the central abdomen, compatible with small bowel obstruction. Tubal ligation clips overlying the pelvis. IMPRESSION: Enteric tube terminates in the proximal stomach. Mildly dilated loops of small bowel in the central abdomen, compatible with small bowel obstruction. Electronically Signed   By: Julian Hy M.D.   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 Management plans discussed with the patient, family and they are in agreement.  CODE STATUS: DNR  DVT Prophylaxis: SCDs  TOTAL TIME TAKING CARE OF THIS PATIENT: 32 minutes.   POSSIBLE D/C IN 2 to 3 DAYS, DEPENDING ON CLINICAL CONDITION.  Saundra Shelling M.D on 07/07/2018 at 11:55 AM  Between 7am to 6pm - Pager - [phone]  After 6pm go to [email or web address] - password [email or web address] [facility]  Office  [phone]  CC: Primary care physician; Elisabeth Cara, NP  Note: This dictation was prepared with Dragon dictation along with smaller phrase technology. Any transcriptional errors that result from this process are unintentional.

## 2018-07-08 LAB — BASIC METABOLIC PANEL
ANION GAP: 3 — AB (ref 5–15)
BUN: 6 mg/dL — ABNORMAL LOW (ref 8–23)
CALCIUM: 8 mg/dL — AB (ref 8.9–10.3)
CO2: 16 mmol/L — ABNORMAL LOW (ref 22–32)
Chloride: 119 mmol/L — ABNORMAL HIGH (ref 98–111)
Creatinine, Ser: 0.74 mg/dL (ref 0.44–1.00)
GFR calc Af Amer: >60 mL/min (ref >60)
Glucose, Bld: 94 mg/dL (ref 70–99)
POTASSIUM: 2.8 mmol/L — AB (ref 3.5–5.1)
SODIUM: 138 mmol/L (ref 135–145)

## 2018-07-08 LAB — MAGNESIUM: MAGNESIUM: 1.3 mg/dL — AB (ref 1.7–2.4)

## 2018-07-08 LAB — PHOSPHORUS: PHOSPHORUS: 2.5 mg/dL (ref 2.5–4.6)

## 2018-07-08 MED ORDER — MAGNESIUM SULFATE 2 GM/50ML IV SOLN
2.0000 g | Freq: Once | INTRAVENOUS | Status: AC
  Administered 2018-07-08: 2 g via INTRAVENOUS
  Filled 2018-07-08: qty 50

## 2018-07-08 MED ORDER — POTASSIUM CHLORIDE IN NACL 40-0.9 MEQ/L-% IV SOLN
INTRAVENOUS | Status: DC
  Administered 2018-07-08 (×2): 75 mL/h via INTRAVENOUS
  Administered 2018-07-09: 50 mL/h via INTRAVENOUS
  Filled 2018-07-08 (×4): qty 1000

## 2018-07-08 MED ORDER — APIXABAN 5 MG PO TABS
5.0000 mg | ORAL_TABLET | Freq: Two times a day (BID) | ORAL | Status: DC
  Administered 2018-07-08 – 2018-07-09 (×3): 5 mg via ORAL
  Filled 2018-07-08 (×3): qty 1

## 2018-07-08 MED ORDER — CITALOPRAM HYDROBROMIDE 20 MG PO TABS
40.0000 mg | ORAL_TABLET | Freq: Every day | ORAL | Status: DC
  Administered 2018-07-08 – 2018-07-09 (×2): 40 mg via ORAL
  Filled 2018-07-08 (×2): qty 2

## 2018-07-08 NOTE — Progress Notes (Signed)
[facility] at [place] NAME: Danielle [name]    MR#:  [id number]  DATE OF BIRTH:  1956/01/11  SUBJECTIVE:  CHIEF COMPLAINT:   Chief Complaint  Patient presents with  . Abdominal Pain  . Emesis  Patient seen and evaluated today Tolerating liquid diet well Has ileostomy with with stool output No abdominal pain No nausea and vomitings  REVIEW OF SYSTEMS:    ROS  CONSTITUTIONAL: No documented fever. No fatigue, weakness. No weight gain, no weight loss.  EYES: No blurry or double vision.  ENT: No tinnitus. No postnasal drip. No redness of the oropharynx.  RESPIRATORY: No cough, no wheeze, no hemoptysis. No dyspnea.  CARDIOVASCULAR: No chest pain. No orthopnea. No palpitations. No syncope.  GASTROINTESTINAL: No nausea, no vomiting or diarrhea.  No abdominal pain. No melena or hematochezia.  Has ileostomy GENITOURINARY: No dysuria or hematuria.  ENDOCRINE: No polyuria or nocturia. No heat or cold intolerance.  HEMATOLOGY: No anemia. No bruising. No bleeding.  INTEGUMENTARY: No rashes. No lesions.  MUSCULOSKELETAL: No arthritis. No swelling. No gout.  NEUROLOGIC: No numbness, tingling, or ataxia. No seizure-type activity.  PSYCHIATRIC: No anxiety. No insomnia. No ADD.   DRUG ALLERGIES:  No Known Allergies  VITALS:  Blood pressure 106/78, pulse 92, temperature 97.9 F (36.6 C), temperature source Oral, resp. rate 20, height 5\' 7"  (1.702 m), weight 59.4 kg, SpO2 99 %.  PHYSICAL EXAMINATION:   Physical Exam  GENERAL:  62 y.o.-year-old patient lying in the bed with no acute distress.  EYES: Pupils equal, round, reactive to light and accommodation. No scleral icterus. Extraocular muscles intact.  HEENT: Head atraumatic, normocephalic. Oropharynx and nasopharynx clear.  NECK:  Supple, no jugular venous distention. No thyroid enlargement, no tenderness.  LUNGS: Normal breath sounds bilaterally, no wheezing, rales, rhonchi. No use of accessory  muscles of respiration.  CARDIOVASCULAR: S1, S2 normal. No murmurs, rubs, or gallops.  ABDOMEN: Soft,no tenderness around umbilicus, no distension. Bowel sounds present. No organomegaly or mass.  Ileostomy noted EXTREMITIES: No cyanosis, clubbing or edema b/l.    NEUROLOGIC: Cranial nerves II through XII are intact. No focal Motor or sensory deficits b/l.   PSYCHIATRIC: The patient is alert and oriented x 3.  SKIN: No obvious rash, lesion, or ulcer.   LABORATORY PANEL:   CBC Recent Labs  Lab 07/06/18 0528  WBC 8.4  HGB 8.2*  HCT 24.6*  PLT 283   ------------------------------------------------------------------------------------------------------------------ Chemistries  Recent Labs  Lab 07/05/18 0052  07/08/18 0406  NA 138   < > 138  K 5.4*   < > 2.8*  CL 112*   < > 119*  CO2 15*   < > 16*  GLUCOSE 112*   < > 94  BUN 11   < > 6*  CREATININE 1.71*   < > 0.74  CALCIUM 10.1   < > 8.0*  MG  --   --  1.3*  AST 37  --   --   ALT 7  --   --   ALKPHOS 72  --   --   BILITOT 1.0  --   --    < > = values in this interval not displayed.   ------------------------------------------------------------------------------------------------------------------  Cardiac Enzymes No results for input(s): TROPONINI in the last 168 hours. ------------------------------------------------------------------------------------------------------------------  RADIOLOGY:  Dg Abd 1 View  Result Date: 07/07/2018 CLINICAL DATA:  Bowel obstruction. EXAM: ABDOMEN - 1 VIEW COMPARISON:  07/06/2018 and CT abdomen pelvis 07/05/2018. FINDINGS: Mild  residual gaseous distention of small bowel. Gas and stool are seen in the rectosigmoid colon. Right lower quadrant ileostomy. No unexpected radiopaque calculi. Lung bases are clear. IMPRESSION: Mild residual small bowel dilatation. Electronically Signed   By: Lorin Picket M.D.   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 Management plans discussed with the patient, family and they are in agreement.  CODE STATUS: DNR  DVT Prophylaxis: SCDs  TOTAL TIME TAKING CARE OF THIS PATIENT: 32 minutes.   POSSIBLE D/C IN 2 to 3 DAYS, DEPENDING ON CLINICAL CONDITION.  Saundra Shelling M.D on 07/08/2018 at 12:06 PM  Between 7am to 6pm - Pager - [phone]  After 6pm go to [email or web address] - password [email or web address] [facility]  Office  [phone]  CC: Primary care physician; Elisabeth Cara, NP  Note: This dictation was prepared with Dragon dictation along with smaller phrase technology. Any transcriptional errors that result from this process are unintentional.

## 2018-07-08 NOTE — Plan of Care (Signed)
Stable. Tolerating diet changed. No nausea or vomiting. Ileostomy and mucus pouch monitored.  Problem: Education: Goal: Knowledge of General Education information will improve Description Including pain rating scale, medication(s)/side effects and non-pharmacologic comfort measures Outcome: Progressing   Problem: Health Behavior/Discharge Planning: Goal: Ability to manage health-related needs will improve Outcome: Progressing   Problem: Clinical Measurements: Goal: Ability to maintain clinical measurements within normal limits will improve Outcome: Progressing Goal: Will remain free from infection Outcome: Progressing Goal: Diagnostic test results will improve Outcome: Progressing Goal: Respiratory complications will improve Outcome: Progressing Goal: Cardiovascular complication will be avoided Outcome: Progressing   Problem: Activity: Goal: Risk for activity intolerance will decrease Outcome: Progressing   Problem: Nutrition: Goal: Adequate nutrition will be maintained Outcome: Progressing   Problem: Coping: Goal: Level of anxiety will decrease Outcome: Progressing   Problem: Elimination: Goal: Will not experience complications related to bowel motility Outcome: Progressing Goal: Will not experience complications related to urinary retention Outcome: Progressing   Problem: Pain Managment: Goal: General experience of comfort will improve Outcome: Progressing   Problem: Safety: Goal: Ability to remain free from injury will improve Outcome: Progressing   Problem: Skin Integrity: Goal: Risk for impaired skin integrity will decrease Outcome: Progressing

## 2018-07-08 NOTE — Progress Notes (Signed)
Subjective:  CC:  Danielle [name] is a 62 y.o. female  Hospital stay day 3,   recurrent SBO.  HPI: Doing better today.  Tolerated clears all day yesterday.  Continues to have ostomy output.  Denies any pain.  ROS:  A 5 point review of systems was performed and pertinent positives and negatives noted in HPI.   Objective:      Temp:  [98.5 F (36.9 C)-98.8 F (37.1 C)] 98.7 F (37.1 C) (09/24 0413) Pulse Rate:  [76-81] 81 (09/24 0413) Resp:  [16-20] 16 (09/24 0413) BP: (121-134)/(70-82) 121/70 (09/24 0413) SpO2:  [96 %-100 %] 96 % (09/24 0413) Weight:  [59.4 kg] 59.4 kg (09/24 0413)     Height: 5\' 7"  (170.2 cm) Weight: 59.4 kg BMI (Calculated): 20.51   Intake/Output this shift:  No intake/output data recorded.       Constitutional :  alert, cooperative, appears stated age and no distress  Respiratory:  clear to auscultation bilaterally  Gastrointestinal: soft, non-tender; bowel sounds normal; no masses,  no organomegaly.  Ostomy and dressings in place.  Midline wound clean, with granulation tissue at base.  Skin: Cool and moist.   Psychiatric: Normal affect, non-agitated, not confused  Musculoskeletal: Steady gait, full ROM    LABS:  CMP Latest Ref Rng & Units 07/08/2018 07/07/2018 07/06/2018  Glucose 70 - 99 mg/dL 94 28(LL) 65(L)  BUN 8 - 23 mg/dL 6(L) 11 14  Creatinine 0.44 - 1.00 mg/dL 0.74 0.82 1.06(H)  Sodium 135 - 145 mmol/L 138 141 139  Potassium 3.5 - 5.1 mmol/L 2.8(L) 3.7 3.6  Chloride 98 - 111 mmol/L 119(H) 119(H) 118(H)  CO2 22 - 32 mmol/L 16(L) 16(L) 16(L)  Calcium 8.9 - 10.3 mg/dL 8.0(L) 8.3(L) 8.5(L)  Total Protein 6.5 - 8.1 g/dL - - -  Total Bilirubin 0.3 - 1.2 mg/dL - - -  Alkaline Phos 38 - 126 U/L - - -  AST 15 - 41 U/L - - -  ALT 0 - 44 U/L - - -   CBC Latest Ref Rng & Units 07/06/2018 07/05/2018 07/02/2018  WBC 3.6 - 11.0 K/uL 8.4 10.6 6.9  Hemoglobin 12.0 - 16.0 g/dL 8.2(L) 11.5(L) 7.8(L)  Hematocrit 35.0 - 47.0 % 24.6(L) 35.1 23.4(L)  Platelets  150 - 440 K/uL 283 378 262    RADS: CLINICAL DATA:  Bowel obstruction.  EXAM: ABDOMEN - 1 VIEW  COMPARISON:  07/06/2018 and CT abdomen pelvis 07/05/2018.  FINDINGS: Mild residual gaseous distention of small bowel. Gas and stool are seen in the rectosigmoid colon. Right lower quadrant ileostomy. No unexpected radiopaque calculi. Lung bases are clear.  IMPRESSION: Mild residual small bowel dilatation.   Electronically Signed   By: Lorin Picket M.D.   On: 07/07/2018 07:59 Assessment:   Recurrent SBO, s/p ex-lap, end ileostomy, mucous fistula. Continuing to improve  Ok to start hearth healthy diet, order placed.  Once tolerating ok to resume eliquis and d/c from surgery standpoint.  Continue daily wet to dry dressing change to midline wound.    F/u with Dr. Peyton Najjar in one week.

## 2018-07-08 NOTE — Discharge Summary (Signed)
[facility] NAME: Danielle [name]    MR#:  [id number]  DATE OF BIRTH:  1956/08/27  DATE OF ADMISSION:  06/29/2018 ADMITTING PHYSICIAN: Amelia Jo, MD  DATE OF DISCHARGE: 07/02/2018 12:07 PM  PRIMARY CARE PHYSICIAN: Elisabeth Cara, NP    ADMISSION DIAGNOSIS:  Small bowel obstruction ([place]) [K56.609] Abdominal pain, unspecified abdominal location [R10.9]  DISCHARGE DIAGNOSIS:  Active Problems:   SBO (small bowel obstruction) ([place])   SECONDARY DIAGNOSIS:   Past Medical History:  Diagnosis Date  . Anxiety   . Arthritis   . Cancer ([place]) left    breast cancer 2000, chemo tx's with total mastectomy and lymph nodes resected.   . Cancer of right lung ([place]) 02/21/2016   rad tx's.   . CHF (congestive heart failure) ([place])   . COPD (chronic obstructive pulmonary disease) ([place])   . Dependence on supplemental oxygen   . Depression   . Diabetes mellitus without complication ([place])   . Heart murmur   . Hypertension   . Lung nodule   . Lymphedema   . Shortness of breath dyspnea    with exertion  . Status post chemotherapy 2001   left breast cancer  . Status post radiation therapy 2001   left breast cancer    HOSPITAL COURSE:   1.Small bowel obstruction.  Initially the plan was to place NG tube, but patient had bowel movement before the tube was placed, Appreciated consult general surgery for further evaluation and treatment. No surgical management at this time and started on clear liquid diet.  Surgery is upgrading diet slowly- suggest to have ostomy nurse consult and set up as out pt.  After ostomy nurse checking her, her bag is not leaking anymore, and she had tolerated soft food- surgery cleared for discharge. 2.Acute renal failure likely prerenal,due to poor p.o. Intake.  gentle IV hydration and monitor kidney function closely.Avoid nephrotoxic medications., Improving. 3.CHF- ch systolic,currently  clinically compensated,continue to monitor closely.Continue Coreg. 4.COPD, without acute exacerbation.Continue maintenance therapy. 5.Paroxysmal atrial fibrillation,currently with sinus tachycardia,heart rate 101.Continue to monitor on telemetry. Continue Coreg for rate control and Eliquis for anticoagulation.  6.Remote history of breast and lung malignancies,status post treatment,currently in remission.Continue follow-up and management, per oncology.   DISCHARGE CONDITIONS:   Stable.  CONSULTS OBTAINED:  Treatment Team:  Herbert Pun, MD  DRUG ALLERGIES:  No Known Allergies  DISCHARGE MEDICATIONS:   Allergies as of 07/02/2018   No Known Allergies     Medication List    TAKE these medications   albuterol 108 (90 Base) MCG/ACT inhaler Commonly known as:  PROVENTIL HFA;VENTOLIN HFA Inhale 2 puffs into the lungs every 6 (six) hours as needed for wheezing or shortness of breath.   apixaban 5 MG Tabs tablet Commonly known as:  ELIQUIS Take 1 tablet (5 mg total) by mouth 2 (two) times daily.   budesonide-formoterol 160-4.5 MCG/ACT inhaler Commonly known as:  SYMBICORT Inhale 2 puffs into the lungs 2 (two) times daily.   carvedilol 6.25 MG tablet Commonly known as:  COREG Take 1 tablet (6.25 mg total) by mouth 2 (two) times daily.   citalopram 40 MG tablet Commonly known as:  CELEXA Take 40 mg by mouth daily.   ferrous sulfate 325 (65 FE) MG tablet Take 1 tablet (325 mg total) by mouth 2 (two) times daily with a meal.   furosemide 20 MG tablet Commonly known as:  LASIX Take 20 mg by mouth daily.  HYDROcodone-acetaminophen 5-325 MG tablet Commonly known as:  NORCO/VICODIN Take 1 tablet by mouth every 6 (six) hours as needed for moderate pain or severe pain.   megestrol 400 MG/10ML suspension Commonly known as:  MEGACE Take 7.5 mLs (300 mg total) by mouth 2 (two) times daily.   multivitamin with minerals Tabs tablet Take 1 tablet by  mouth daily.        DISCHARGE INSTRUCTIONS:    Follow with PMD in 1 week.  If you experience worsening of your admission symptoms, develop shortness of breath, life threatening emergency, suicidal or homicidal thoughts you must seek medical attention immediately by calling 911 or calling your MD immediately  if symptoms less severe.  You Must read complete instructions/literature along with all the possible adverse reactions/side effects for all the Medicines you take and that have been prescribed to you. Take any new Medicines after you have completely understood and accept all the possible adverse reactions/side effects.   Please note  You were cared for by a hospitalist during your hospital stay. If you have any questions about your discharge medications or the care you received while you were in the hospital after you are discharged, you can call the unit and asked to speak with the hospitalist on call if the hospitalist that took care of you is not available. Once you are discharged, your primary care physician will handle any further medical issues. Please note that NO REFILLS for any discharge medications will be authorized once you are discharged, as it is imperative that you return to your primary care physician (or establish a relationship with a primary care physician if you do not have one) for your aftercare needs so that they can reassess your need for medications and monitor your lab values.    Today   CHIEF COMPLAINT:   Chief Complaint  Patient presents with  . Abdominal Pain    HISTORY OF PRESENT ILLNESS:  Danielle [name]  is a 62 y.o. female with a known history of COPD, CHF, remote breast and lung malignancies, status post treatment.  Most recently, approximately 2 months ago patient underwent right hemicolectomy with ileostomy placement, due to ischemic colitis. Patient presented to emergency room this time for abdominal pain and nausea, going on for the past 24  hours, gradually getting worse.  She also noticed decreased output in her ostomy bag in the past 24 hours.  The abdominal pain is described as 6 out of 10 constant, sharp pain located in the mid abdominal area, without any radiation.  Patient denies any vomiting, no fever, no chills. Blood test done emergency room are notable for creatinine level of 1.5, sodium level 131, WBC at 12.7. CT scan of the abdomen shows  fairly long segment fluid-filled and dilated loop of small bowel beginning at the transition within the pelvis and remaining dilated to the level of the ileostomy within the right anterior abdomen. Patient is admitted for further evaluation and treatment.   VITAL SIGNS:  Blood pressure 120/82, pulse 92, temperature 98.6 F (37 C), temperature source Oral, resp. rate 18, height 5\' 7"  (1.702 m), weight 51.8 kg, SpO2 100 %.  I/O:  No intake or output data in the 24 hours ending 07/08/18 0734  PHYSICAL EXAMINATION:   GENERAL:  62 y.o.-year-old patient lying in the bed with no acute distress.  EYES: Pupils equal, round, reactive to light and accommodation. No scleral icterus. Extraocular muscles intact.  HEENT: Head atraumatic, normocephalic. Oropharynx and nasopharynx clear.  NECK:  Supple, no jugular venous distention. No thyroid enlargement, no tenderness.  LUNGS: Normal breath sounds bilaterally, no wheezing, rales,rhonchi or crepitation. No use of accessory muscles of respiration.  CARDIOVASCULAR: S1, S2 normal. No murmurs, rubs, or gallops.  ABDOMEN: Soft, nontender, nondistended. Bowel sounds present. No organomegaly or mass. Ileostomy bag in place. EXTREMITIES: No pedal edema, cyanosis, or clubbing.  NEUROLOGIC: Cranial nerves II through XII are intact. Muscle strength 5/5 in all extremities. Sensation intact. Gait not checked.  PSYCHIATRIC: The patient is alert and oriented x 3.  SKIN: No obvious rash, lesion, or ulcer.   DATA REVIEW:   CBC Recent Labs  Lab 07/06/18 0528   WBC 8.4  HGB 8.2*  HCT 24.6*  PLT 283    Chemistries  Recent Labs  Lab 07/05/18 0052  07/08/18 0406  NA 138   < > 138  K 5.4*   < > 2.8*  CL 112*   < > 119*  CO2 15*   < > 16*  GLUCOSE 112*   < > 94  BUN 11   < > 6*  CREATININE 1.71*   < > 0.74  CALCIUM 10.1   < > 8.0*  MG  --   --  1.3*  AST 37  --   --   ALT 7  --   --   ALKPHOS 72  --   --   BILITOT 1.0  --   --    < > = values in this interval not displayed.    Cardiac Enzymes No results for input(s): TROPONINI in the last 168 hours.  Microbiology Results  Results for orders placed or performed during the hospital encounter of 04/21/18  MRSA PCR Screening     Status: None   Collection Time: 04/21/18  6:53 AM  Result Value Ref Range Status   MRSA by PCR NEGATIVE NEGATIVE Final    Comment:        The GeneXpert MRSA Assay (FDA approved for NASAL specimens only), is one component of a comprehensive MRSA colonization surveillance program. It is not intended to diagnose MRSA infection nor to guide or monitor treatment for MRSA infections. Performed at [facility], [address] [place], [place] [address]   C difficile quick scan w PCR reflex     Status: None   Collection Time: 04/27/18  2:36 PM  Result Value Ref Range Status   C Diff antigen NEGATIVE NEGATIVE Final   C Diff toxin NEGATIVE NEGATIVE Final   C Diff interpretation No C. difficile detected.  Final    Comment: Performed at [facility], [address] [place], [place] [address]  Culture, respiratory (NON-Expectorated)     Status: None   Collection Time: 05/05/18 11:32 AM  Result Value Ref Range Status   Specimen Description   Final    TRACHEAL ASPIRATE Performed at [facility], [address] [place], [place] [address]    Special Requests   Final    NONE Performed at [facility], [address] [place], [place] [address]    Gram Stain   Final    RARE WBC PRESENT, PREDOMINANTLY PMN RARE GRAM  POSITIVE COCCI RARE GRAM POSITIVE RODS    Culture   Final    Consistent with normal respiratory flora. Performed at [facility], [place] [address], [place], [place] [address]    Report Status 05/07/2018 FINAL  Final    RADIOLOGY:  Dg Abd 1 View  Result Date: 07/07/2018  CLINICAL DATA:  Bowel obstruction. EXAM: ABDOMEN - 1 VIEW COMPARISON:  07/06/2018 and CT abdomen pelvis 07/05/2018. FINDINGS: Mild residual gaseous distention of small bowel. Gas and stool are seen in the rectosigmoid colon. Right lower quadrant ileostomy. No unexpected radiopaque calculi. Lung bases are clear. IMPRESSION: Mild residual small bowel dilatation. Electronically Signed   By: Lorin Picket M.D.   On: 07/07/2018 07:59    EKG:   Orders placed or performed during the hospital encounter of 04/21/18  . ED EKG  . ED EKG  . EKG 12-Lead  . EKG 12-Lead     Management plans discussed with the patient, family and they are in agreement.  CODE STATUS:  Code Status History    Date Active Date Inactive Code Status Order ID Comments User Context   06/30/2018 1733 07/02/2018 1507 DNR [id number]  [name], MD Inpatient   06/30/2018 0019 06/30/2018 1733 Full Code [id number]  Amelia Jo, MD Inpatient   06/04/2018 0113 06/05/2018 1448 Full Code [id number]  Amelia Jo, MD Inpatient   04/21/2018 0633 05/17/2018 2214 Full Code [id number]  Arta Silence, MD Inpatient   02/09/2017 1125 02/11/2017 1706 Full Code [id number]  Henreitta Leber, MD Inpatient    Advance Directive Documentation     Most Recent Value  Type of Advance Directive  Healthcare Power of Attorney, Living will  Pre-existing out of facility DNR order (yellow form or pink MOST form)  -  "MOST" Form in Place?  -      TOTAL TIME TAKING CARE OF THIS PATIENT: 35 minutes.    [name] M.D on 07/08/2018 at 7:34 AM  Between 7am to 6pm - Pager - [phone]  After 6pm go to [email or web address] - password [email or web address]  [facility]  Office  [phone]  CC: Primary care physician; Elisabeth Cara, NP   Note: This dictation was prepared with Dragon dictation along with smaller phrase technology. Any transcriptional errors that result from this process are unintentional.

## 2018-07-09 LAB — IRON AND TIBC
Iron: 54 ug/dL (ref 28–170)
Saturation Ratios: 25 % (ref 10.4–31.8)
TIBC: 213 ug/dL — ABNORMAL LOW (ref 250–450)
UIBC: 159 ug/dL

## 2018-07-09 LAB — CBC
HEMATOCRIT: 24.7 % — AB (ref 35.0–47.0)
Hemoglobin: 8 g/dL — ABNORMAL LOW (ref 12.0–16.0)
MCH: 30.4 pg (ref 26.0–34.0)
MCHC: 32.4 g/dL (ref 32.0–36.0)
MCV: 93.7 fL (ref 80.0–100.0)
Platelets: 285 10*3/uL (ref 150–440)
RBC: 2.64 MIL/uL — ABNORMAL LOW (ref 3.80–5.20)
RDW: 15.6 % — AB (ref 11.5–14.5)
WBC: 7.1 10*3/uL (ref 3.6–11.0)

## 2018-07-09 LAB — RETICULOCYTES
RBC.: 2.76 MIL/uL — AB (ref 3.80–5.20)
RETIC COUNT ABSOLUTE: 46.9 10*3/uL (ref 19.0–183.0)
RETIC CT PCT: 1.7 % (ref 0.4–3.1)

## 2018-07-09 LAB — FOLATE: FOLATE: 20.5 ng/mL (ref >5.9)

## 2018-07-09 LAB — MAGNESIUM: MAGNESIUM: 1.7 mg/dL (ref 1.7–2.4)

## 2018-07-09 LAB — POTASSIUM: Potassium: 3.7 mmol/L (ref 3.5–5.1)

## 2018-07-09 LAB — VITAMIN B12: VITAMIN B 12: 1059 pg/mL — AB (ref 180–914)

## 2018-07-09 LAB — FERRITIN: Ferritin: 72 ng/mL (ref 11–307)

## 2018-07-09 NOTE — Discharge Summary (Signed)
[employer] - [place] at [facility], [name] y.o., DOB 1956-02-01, MRN [id number]. 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    Patient was noted to have a small bowel obstruction.  And surgery recommended medical admission.  She had NG tube placement with resolution of her symptoms.  Patient doing much better denies any abdominal pain or vomiting.             Consults  None  Significant Tests:  See full reports for all details     Ct Abdomen Pelvis Wo Contrast  Result Date: 07/05/2018 CLINICAL DATA:  62 year old female with acute abdominal pain. Status post right hemi colectomy with end ileostomy and mucous fistula. EXAM: CT ABDOMEN AND PELVIS WITHOUT CONTRAST TECHNIQUE: Multidetector CT imaging of the abdomen and pelvis was performed following the standard protocol without IV contrast. COMPARISON:  CT of the abdomen pelvis dated 06/29/2018 FINDINGS: Evaluation of this exam is limited in the absence of intravenous contrast. Lower chest: The visualized lung bases are clear. Multi vessel coronary vascular calcification. There is hypoattenuation of the cardiac blood pool suggestive of a degree of anemia. Clinical correlation is recommended. No intra-abdominal free air.  Small ascites. Hepatobiliary: A 1 cm hypodense lesion in the liver is not well  characterized. No intrahepatic biliary ductal dilatation. The gallbladder is unremarkable as visualized. Pancreas: Unremarkable. No pancreatic ductal dilatation or surrounding inflammatory changes. Spleen: Normal in size without focal abnormality. Adrenals/Urinary Tract: The adrenal glands are unremarkable. A 1 cm hypodense lesion in the superior pole of the left kidney is not well characterized. There is no hydronephrosis on either side. The urinary bladder is grossly unremarkable. Stomach/Bowel: Postsurgical changes of right hemicolectomy with a left anterior abdominal wall colostomy. A right lower quadrant ileostomy is also noted. There is diffuse dilatation of fluid-filled loops of small bowel measuring up to 3.5 cm in diameter with a probable transition in the right hemiabdomen likely secondary to adhesions. Overall there has been interval progression of the small bowel dilatation since the prior CT. Vascular/Lymphatic: There is advanced aortoiliac atherosclerotic disease. No portal venous gas. There is no adenopathy. Reproductive: Probable uterine fibroid. Other: Midline vertical anterior abdominal wall incisional scar. Musculoskeletal: Osteopenia. Age indeterminate L4 compression fracture similar to prior CT. IMPRESSION: 1. Small-bowel obstruction with interval worsening since the prior CT and a probable transition in the right hemiabdomen. 2. Small ascites, increased in size since the prior CT. 3. Advanced Aortic Atherosclerosis (ICD10-I70.0). Electronically Signed   By: Anner Crete M.D.   On: 07/05/2018 02:09   Dg Chest 1 View  Result Date: 07/05/2018 CLINICAL DATA:  62 y/o  F; NG tube placement.  Two days of cough. EXAM: CHEST  1 VIEW COMPARISON:  06/29/2018 chest radiograph FINDINGS: Stable normal cardiac silhouette given projection and technique. Aortic atherosclerosis with calcification. Right upper lung zone stable scarring. No focal consolidation. No pleural effusion or pneumothorax. No  acute  osseous abnormality is evident. Enteric tube is coiling in the proximal stomach with tip projecting over the cardia. IMPRESSION: 1. Enteric tube is coiling in the proximal stomach with tip projecting over the gastric cardia. 2. No acute pulmonary process identified. 3.  Aortic Atherosclerosis (ICD10-I70.0). Electronically Signed   By: Kristine Garbe M.D.   On: 07/05/2018 04:55   Dg Abd 1 View  Result Date: 07/07/2018 CLINICAL DATA:  Bowel obstruction. EXAM: ABDOMEN - 1 VIEW COMPARISON:  07/06/2018 and CT abdomen pelvis 07/05/2018. FINDINGS: Mild residual gaseous distention of small bowel. Gas and stool are seen in the rectosigmoid colon. Right lower quadrant ileostomy. No unexpected radiopaque calculi. Lung bases are clear. IMPRESSION: Mild residual small bowel dilatation. Electronically Signed   By: Lorin Picket M.D.   On: 07/07/2018 07:59   Dg Abd 1 View  Result Date: 07/06/2018 CLINICAL DATA:  Small bowel obstruction EXAM: ABDOMEN - 1 VIEW COMPARISON:  CT abdomen/pelvis dated 07/05/2018 FINDINGS: Enteric tube terminates in the proximal stomach. Mildly dilated loops of small bowel in the central abdomen, compatible with small bowel obstruction. Tubal ligation clips overlying the pelvis. IMPRESSION: Enteric tube terminates in the proximal stomach. Mildly dilated loops of small bowel in the central abdomen, compatible with small bowel obstruction. Electronically Signed   By: Julian Hy M.D.   On: 07/06/2018 08:08   Dg Abdomen 1 View  Result Date: 06/29/2018 CLINICAL DATA:  Evaluate NG tube placement. EXAM: ABDOMEN - 1 VIEW COMPARISON:  Chest radiograph 06/29/2018 FINDINGS: Enteric tube is coiled within the esophagus. Multiple gaseous distended loops of bowel within the upper abdomen. IMPRESSION: Enteric tube coiled within the esophagus. Recommend repeat positioning. These results will be called to the ordering clinician or representative by the Radiologist Assistant, and communication  documented in the PACS or zVision Dashboard. Electronically Signed   By: Lovey Newcomer M.D.   On: 06/29/2018 22:59   Ct Abdomen Pelvis W Contrast  Result Date: 06/29/2018 CLINICAL DATA:  Patient status post hemicolectomy for ischemic colitis. History of lung cancer and breast cancer. EXAM: CT ABDOMEN AND PELVIS WITH CONTRAST TECHNIQUE: Multidetector CT imaging of the abdomen and pelvis was performed using the standard protocol following bolus administration of intravenous contrast. CONTRAST:  78mL OMNIPAQUE IOHEXOL 300 MG/ML  SOLN COMPARISON:  Abdominal series earlier same day; PET/CT 06/20/2018; CT abdomen pelvis 05/11/2018 FINDINGS: Lower chest: Normal heart size. Lung bases are clear. No pleural effusion. Hepatobiliary: Liver is normal in size and contour. Unchanged subcentimeter too small to characterize low-attenuation lesion hepatic dome (image 10; series 2). Gallbladder is unremarkable. Pancreas: Unremarkable Spleen: Unremarkable Adrenals/Urinary Tract: Adrenal glands are normal. Kidneys enhance symmetrically with contrast. Unchanged cyst superior pole left kidney. Urinary bladder is decompressed. Stomach/Bowel: The stomach is fluid-filled and distended. The mid and distal small bowel is dilated and fluid-filled to the level of the right lower quadrant ileostomy. Small bowel loops measure up to 3.3 cm. Fecalization of the contents within the distal small bowel. Additionally, there is abrupt transition between decompressed mid small bowel and dilated mid to distal small bowel within the pelvis (image 53; series 5). 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 Vascular/Lymphatic: Extensive peripheral calcified atherosclerotic plaque involving the abdominal aorta. No retroperitoneal lymphadenopathy. Reproductive: Uterus and adnexal structures unremarkable. Other: Moderate volume free fluid in the pelvis. Musculoskeletal: Midline laparotomy incision. Lumbar spine degenerative changes. No  aggressive or acute appearing osseous lesions. IMPRESSION: 1. Fairly long segment fluid-filled and dilated loop of small bowel beginning at  the transition within the pelvis and remaining dilated to the level of the ileostomy within the right anterior abdomen. Findings are concerning for obstruction. As there appears to be transition at both the proximal and distal aspect of the dilated distal small bowel, the possibility of a long segment closed loop obstruction is not entirely excluded. Moderate volume free fluid within the pelvis. Electronically Signed   By: Lovey Newcomer M.D.   On: 06/29/2018 20:59   Nm Pet Image Restag (ps) Skull Base To Thigh  Result Date: 06/20/2018 CLINICAL DATA:  Subsequent treatment strategy for RIGHT lung cancer. Remote history of breast cancer. EXAM: NUCLEAR MEDICINE PET SKULL BASE TO THIGH TECHNIQUE: 7.1 mCi F-18 FDG was injected intravenously. Full-ring PET imaging was performed from the skull base to thigh after the radiotracer. CT data was obtained and used for attenuation correction and anatomic localization. Fasting blood glucose: 64 mg/dl COMPARISON:  None. FINDINGS: Mediastinal blood pool activity: SUV max 2.2 NECK: No hypermetabolic lymph nodes in the neck. Incidental CT findings: none CHEST: Flattened perihilar consolidation in the RIGHT upper lobe at treatment site is similar comparison CT and has mild metabolic activity most consists with post radiation treatment inflammation. No hypermetabolic nodularity. No hypermetabolic mediastinal lymph nodes. Physiologic activity noted in the RIGHT atrial appendage. No hypermetabolic supraclavicular adenopathy. Incidental CT findings: None ABDOMEN/PELVIS: No abnormal hypermetabolic activity within the liver, pancreas, adrenal glands, or spleen. No hypermetabolic lymph nodes in the abdomen or pelvis. Metabolic activity associated with 2 abdominal ostomy sites which is favored physiologic. Incidental CT findings: none SKELETON: No focal  hypermetabolic activity to suggest skeletal metastasis. Incidental CT findings: none IMPRESSION: 1. Mild metabolic activity associated with RIGHT perihilar fibrotic consolidation is favored benign post radiation inflammation. No evidence of lung cancer recurrence. 2. No evidence distant metastatic disease. Electronically Signed   By: Suzy Bouchard M.D.   On: 06/20/2018 14:52   Dg Abd 2 Views  Result Date: 06/30/2018 CLINICAL DATA:  Small bowel obstruction. EXAM: ABDOMEN - 2 VIEW COMPARISON:  06/29/2018 FINDINGS: Bowel-gas pattern has improved. Gas within nondistended small bowel loops in the mid abdomen. Contrast material within the urinary bladder from prior CT. Bilateral tubal ligation clips noted. No free air organomegaly. IMPRESSION: Improved bowel gas pattern.  No free air. Electronically Signed   By: Rolm Baptise M.D.   On: 06/30/2018 10:36   Dg Abdomen Acute W/chest  Result Date: 06/29/2018 CLINICAL DATA:  Abdominal pain periumbilical beginning this morning. Nausea and vomiting. Right lung cancer. EXAM: DG ABDOMEN ACUTE W/ 1V CHEST COMPARISON:  Chest x-ray 05/15/2018 and KUB 05/10/2018 FINDINGS: Lungs are adequately inflated with right upper lobe/perihilar scarring. Cardiomediastinal silhouette and remainder of the chest is unchanged. Abdominopelvic images demonstrate several air-filled dilated central small bowel loops measuring up to 3.4 cm in diameter. No free peritoneal air. Paucity of bowel gas within the colon. Remainder of the exam is unchanged. IMPRESSION: Several air-filled dilated small bowel loops with paucity of gas within the colon. Findings likely due to early/partial small bowel obstruction and less likely ileus. No acute cardiopulmonary disease. Right upper lobe/perihilar scarring. Electronically Signed   By: Marin Olp M.D.   On: 06/29/2018 20:11       Today   Subjective:   Danielle [name] patient doing better no further abdominal pain Objective:   Blood pressure  129/84, pulse 88, temperature 98 F (36.7 C), temperature source Oral, resp. rate 17, height 5\' 7"  (1.702 m), weight 60.2 kg, SpO2 97 %.  [name]  Intake/Output Summary (Last 24 hours) at 07/09/2018 1404 Last data filed at 07/09/2018 1307 Gross per 24 hour  Intake 2514 ml  Output 875 ml  Net 1639 ml    Exam VITAL SIGNS: Blood pressure 129/84, pulse 88, temperature 98 F (36.7 C), temperature source Oral, resp. rate 17, height 5\' 7"  (1.702 m), weight 60.2 kg, SpO2 97 %.  GENERAL:  62 y.o.-year-old patient lying in the bed with no acute distress.  EYES: Pupils equal, round, reactive to light and accommodation. No scleral icterus. Extraocular muscles intact.  HEENT: Head atraumatic, normocephalic. Oropharynx and nasopharynx clear.  NECK:  Supple, no jugular venous distention. No thyroid enlargement, no tenderness.  LUNGS: Normal breath sounds bilaterally, no wheezing, rales,rhonchi or crepitation. No use of accessory muscles of respiration.  CARDIOVASCULAR: S1, S2 normal. No murmurs, rubs, or gallops.  ABDOMEN: Soft, nontender, nondistended. Bowel sounds present. No organomegaly or mass.  EXTREMITIES: No pedal edema, cyanosis, or clubbing.  NEUROLOGIC: Cranial nerves II through XII are intact. Muscle strength 5/5 in all extremities. Sensation intact. Gait not checked.  PSYCHIATRIC: The patient is alert and oriented x 3.  SKIN: No obvious rash, lesion, or ulcer.   Data Review     CBC w Diff:  Lab Results  Component Value Date   WBC 7.1 07/09/2018   HGB 8.0 (L) 07/09/2018   HGB 11.5 (L) 02/09/2015   HCT 24.7 (L) 07/09/2018   HCT 34.9 (L) 02/09/2015   PLT 285 07/09/2018   PLT 249 02/09/2015   LYMPHOPCT 33 07/05/2018   LYMPHOPCT 36.4 02/09/2015   BANDSPCT 0 04/24/2018   MONOPCT 5 07/05/2018   MONOPCT 9.1 02/09/2015   EOSPCT 1 07/05/2018   EOSPCT 3.4 02/09/2015   BASOPCT 1 07/05/2018   BASOPCT 1.9 02/09/2015   CMP:  Lab Results  Component Value Date   NA 138 07/08/2018   NA 132  (L) 02/09/2015   K 3.7 07/09/2018   K 3.8 02/09/2015   CL 119 (H) 07/08/2018   CL 95 (L) 02/09/2015   CO2 16 (L) 07/08/2018   CO2 29 02/09/2015   BUN 6 (L) 07/08/2018   BUN 16 02/09/2015   CREATININE 0.74 07/08/2018   CREATININE 0.81 02/09/2015   PROT 8.4 (H) 07/05/2018   PROT 7.7 02/09/2015   ALBUMIN 4.3 07/05/2018   ALBUMIN 4.3 02/09/2015   BILITOT 1.0 07/05/2018   BILITOT 0.9 02/09/2015   ALKPHOS 72 07/05/2018   ALKPHOS 69 02/09/2015   AST 37 07/05/2018   AST 29 02/09/2015   ALT 7 07/05/2018   ALT 20 02/09/2015  .  Micro Results No results found for this or any previous visit (from the past 240 hour(s)).      Code Status Orders  (From admission, onward)         Start     Ordered   07/05/18 0808  Do not attempt resuscitation (DNR)  Continuous    Question Answer Comment  In the event of cardiac or respiratory ARREST Do not call a "code blue"   In the event of cardiac or respiratory ARREST Do not perform Intubation, CPR, defibrillation or ACLS   In the event of cardiac or respiratory ARREST Use medication by any route, position, wound care, and other measures to relive pain and suffering. May use oxygen, suction and manual treatment of airway obstruction as needed for comfort.      07/05/18 0807        Code Status History    Date Active Date Inactive  Code Status Order ID Comments User Context   06/30/2018 1733 07/02/2018 1507 DNR [id number]  [name], MD Inpatient   06/30/2018 0019 06/30/2018 1733 Full Code [id number]  Amelia Jo, MD Inpatient   06/04/2018 0113 06/05/2018 1448 Full Code [id number]  Amelia Jo, MD Inpatient   04/21/2018 0633 05/17/2018 2214 Full Code [id number]  Arta Silence, MD Inpatient   02/09/2017 1125 02/11/2017 1706 Full Code [id number]  Henreitta Leber, MD Inpatient    Advance Directive Documentation     Most Recent Value  Type of Advance Directive  Healthcare Power of Attorney  Pre-existing out of facility DNR order (yellow  form or pink MOST form)  -  "MOST" Form in Place?  -          Follow-up Information    Herbert Pun, MD On 07/17/2018.   Specialty:  General Surgery Why:  Thursday October 3rd at 11:15am for a follow-up  Contact information: [place] [address] [phone]        Elisabeth Cara, NP. Go in 1 week.   Specialty:  Nurse Practitioner Why:  October 1st apointment at 9:10am hosp f/u and anemia Contact information: [place] [address] [phone]           Discharge Medications   Allergies as of 07/09/2018   No Known Allergies     Medication List    TAKE these medications   albuterol 108 (90 Base) MCG/ACT inhaler Commonly known as:  PROVENTIL HFA;VENTOLIN HFA Inhale 2 puffs into the lungs every 6 (six) hours as needed for wheezing or shortness of breath.   apixaban 5 MG Tabs tablet Commonly known as:  ELIQUIS Take 1 tablet (5 mg total) by mouth 2 (two) times daily.   budesonide-formoterol 160-4.5 MCG/ACT inhaler Commonly known as:  SYMBICORT Inhale 2 puffs into the lungs 2 (two) times daily.   carvedilol 6.25 MG tablet Commonly known as:  COREG Take 1 tablet (6.25 mg total) by mouth 2 (two) times daily.   citalopram 40 MG tablet Commonly known as:  CELEXA Take 40 mg by mouth daily.   ferrous sulfate 325 (65 FE) MG tablet Take 1 tablet (325 mg total) by mouth 2 (two) times daily with a meal.   furosemide 20 MG tablet Commonly known as:  LASIX Take 20 mg by mouth daily.   HYDROcodone-acetaminophen 5-325 MG tablet Commonly known as:  NORCO/VICODIN Take 1 tablet by mouth every 6 (six) hours as needed for moderate pain or severe pain.   megestrol 400 MG/10ML suspension Commonly known as:  MEGACE Take 7.5 mLs (300 mg total) by mouth 2 (two) times daily.   multivitamin with minerals Tabs tablet Take 1 tablet by mouth daily.          Total Time in preparing paper work, data evaluation and  todays exam - 68 minutes  Dustin Flock M.D on 07/09/2018 at 2:04 PM [place]  [phone]

## 2018-07-09 NOTE — Care Management Note (Signed)
Case Management Note  Patient Details  Name: Danielle [name] MRN: [id number] Date of Birth: 02-21-1956   Patient to discharge today.  Patient admitted from home with SBO.  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  Patient lives at home with daughter.  Patient does not have insurance.  PCP Elisabeth Cara at [facility].  Patient open with [facility] for [facility] and PT.  Patient has also previously been provided with charity RW and BSC.  Resumption orders have been placed.  Corene Cornea with [facility] notified of discharge.  Per Corene Cornea new box of supplies delivered to patient home on 9/21.  Bedside RN to send patient home with 2-3 pouches.   Subjective/Objective:                    Action/Plan:   Expected Discharge Date:  07/09/18               Expected Discharge Plan:  [place]  In-House Referral:     Discharge planning Services  CM Consult  Post Acute Care Choice:  Resumption of Svcs/PTA Provider Choice offered to:     DME Arranged:    DME Agency:     HH Arranged:  RN, PT [place] Agency:  [facility]  Status of Service:  Completed, signed off  If discussed at [employer] of Stay Meetings, dates discussed:    Additional Comments:  Beverly Sessions, RN 07/09/2018, 3:37 PM

## 2018-07-17 ENCOUNTER — Emergency Department: Payer: Medicaid Other

## 2018-07-17 ENCOUNTER — Other Ambulatory Visit: Payer: Self-pay

## 2018-07-17 ENCOUNTER — Inpatient Hospital Stay
Admission: EM | Admit: 2018-07-17 | Discharge: 2018-07-23 | DRG: 389 | Disposition: A | Discharge status: Home / Self Care | Payer: Medicaid Other | Attending: Internal Medicine | Admitting: Internal Medicine

## 2018-07-17 DIAGNOSIS — Z85118 Personal history of other malignant neoplasm of bronchus and lung: Secondary | ICD-10-CM

## 2018-07-17 DIAGNOSIS — Z923 Personal history of irradiation: Secondary | ICD-10-CM

## 2018-07-17 DIAGNOSIS — I110 Hypertensive heart disease with heart failure: Secondary | ICD-10-CM | POA: Diagnosis present

## 2018-07-17 DIAGNOSIS — K2100 Gastro-esophageal reflux disease with esophagitis, without bleeding: Secondary | ICD-10-CM

## 2018-07-17 DIAGNOSIS — E871 Hypo-osmolality and hyponatremia: Secondary | ICD-10-CM | POA: Diagnosis present

## 2018-07-17 DIAGNOSIS — D638 Anemia in other chronic diseases classified elsewhere: Secondary | ICD-10-CM | POA: Diagnosis present

## 2018-07-17 DIAGNOSIS — F419 Anxiety disorder, unspecified: Secondary | ICD-10-CM | POA: Diagnosis present

## 2018-07-17 DIAGNOSIS — Z803 Family history of malignant neoplasm of breast: Secondary | ICD-10-CM

## 2018-07-17 DIAGNOSIS — E860 Dehydration: Secondary | ICD-10-CM | POA: Diagnosis present

## 2018-07-17 DIAGNOSIS — Z9981 Dependence on supplemental oxygen: Secondary | ICD-10-CM

## 2018-07-17 DIAGNOSIS — Z9012 Acquired absence of left breast and nipple: Secondary | ICD-10-CM

## 2018-07-17 DIAGNOSIS — Z9221 Personal history of antineoplastic chemotherapy: Secondary | ICD-10-CM

## 2018-07-17 DIAGNOSIS — K56609 Unspecified intestinal obstruction, unspecified as to partial versus complete obstruction: Secondary | ICD-10-CM | POA: Diagnosis present

## 2018-07-17 DIAGNOSIS — Z66 Do not resuscitate: Secondary | ICD-10-CM | POA: Diagnosis present

## 2018-07-17 DIAGNOSIS — D500 Iron deficiency anemia secondary to blood loss (chronic): Secondary | ICD-10-CM | POA: Diagnosis present

## 2018-07-17 DIAGNOSIS — I5022 Chronic systolic (congestive) heart failure: Secondary | ICD-10-CM | POA: Diagnosis present

## 2018-07-17 DIAGNOSIS — J449 Chronic obstructive pulmonary disease, unspecified: Secondary | ICD-10-CM | POA: Diagnosis present

## 2018-07-17 DIAGNOSIS — E119 Type 2 diabetes mellitus without complications: Secondary | ICD-10-CM | POA: Diagnosis present

## 2018-07-17 DIAGNOSIS — F329 Major depressive disorder, single episode, unspecified: Secondary | ICD-10-CM | POA: Diagnosis present

## 2018-07-17 DIAGNOSIS — Z853 Personal history of malignant neoplasm of breast: Secondary | ICD-10-CM

## 2018-07-17 DIAGNOSIS — Z87891 Personal history of nicotine dependence: Secondary | ICD-10-CM

## 2018-07-17 DIAGNOSIS — K567 Ileus, unspecified: Secondary | ICD-10-CM | POA: Diagnosis present

## 2018-07-17 DIAGNOSIS — K559 Vascular disorder of intestine, unspecified: Secondary | ICD-10-CM | POA: Diagnosis present

## 2018-07-17 DIAGNOSIS — N179 Acute kidney failure, unspecified: Secondary | ICD-10-CM | POA: Diagnosis present

## 2018-07-17 DIAGNOSIS — K920 Hematemesis: Secondary | ICD-10-CM | POA: Diagnosis present

## 2018-07-17 DIAGNOSIS — K56600 Partial intestinal obstruction, unspecified as to cause: Principal | ICD-10-CM | POA: Diagnosis present

## 2018-07-17 DIAGNOSIS — I700 Atherosclerosis of aorta: Secondary | ICD-10-CM | POA: Diagnosis present

## 2018-07-17 DIAGNOSIS — M1990 Unspecified osteoarthritis, unspecified site: Secondary | ICD-10-CM | POA: Diagnosis present

## 2018-07-17 DIAGNOSIS — I480 Paroxysmal atrial fibrillation: Secondary | ICD-10-CM | POA: Diagnosis present

## 2018-07-17 DIAGNOSIS — R188 Other ascites: Secondary | ICD-10-CM | POA: Diagnosis present

## 2018-07-17 LAB — CBC
HEMATOCRIT: 32.1 % — AB (ref 35.0–47.0)
HEMOGLOBIN: 10.9 g/dL — AB (ref 12.0–16.0)
MCH: 31 pg (ref 26.0–34.0)
MCHC: 34 g/dL (ref 32.0–36.0)
MCV: 91.1 fL (ref 80.0–100.0)
Platelets: 502 10*3/uL — ABNORMAL HIGH (ref 150–440)
RBC: 3.52 MIL/uL — AB (ref 3.80–5.20)
RDW: 14.5 % (ref 11.5–14.5)
WBC: 12.7 10*3/uL — ABNORMAL HIGH (ref 3.6–11.0)

## 2018-07-17 LAB — COMPREHENSIVE METABOLIC PANEL
ALK PHOS: 72 U/L (ref 38–126)
ALT: 11 U/L (ref 0–44)
AST: 24 U/L (ref 15–41)
Albumin: 4.6 g/dL (ref 3.5–5.0)
Anion gap: 12 (ref 5–15)
BILIRUBIN TOTAL: 1 mg/dL (ref 0.3–1.2)
BUN: 31 mg/dL — ABNORMAL HIGH (ref 8–23)
CALCIUM: 9.8 mg/dL (ref 8.9–10.3)
CO2: 18 mmol/L — AB (ref 22–32)
CREATININE: 1.68 mg/dL — AB (ref 0.44–1.00)
Chloride: 99 mmol/L (ref 98–111)
GFR calc Af Amer: 36 mL/min — ABNORMAL LOW (ref >60)
GFR, EST NON AFRICAN AMERICAN: 31 mL/min — AB (ref >60)
Glucose, Bld: 120 mg/dL — ABNORMAL HIGH (ref 70–99)
Potassium: 4.9 mmol/L (ref 3.5–5.1)
SODIUM: 129 mmol/L — AB (ref 135–145)
TOTAL PROTEIN: 8.5 g/dL — AB (ref 6.5–8.1)

## 2018-07-17 LAB — GASTRIC OCCULT BLOOD (1-CARD TO LAB)
Occult Blood, Gastric: POSITIVE — AB
pH, Gastric: 2

## 2018-07-17 IMAGING — CT CT CHEST W/ CM
1 series · 13 of 34 positions shown, 17 images · IV contrast (iopamidol)
Comparison: Chest CT 02/28/2016.  PET-CT 07/13/2016.

CLINICAL DATA: 60-year-old female with history of right upper lobe
lung cancer diagnosed in February 2016 status post radiation therapy.
Currently asymptomatic. Additional history of left-sided breast
cancer status post mastectomy, chemotherapy and radiation therapy in
the early 2000s. Followup study.

EXAM:
CT CHEST WITH CONTRAST
TECHNIQUE: Multidetector CT imaging of the chest was performed during
intravenous contrast administration.
CONTRAST:  75mL VIDIAG-KBB IOPAMIDOL (VIDIAG-KBB) INJECTION 61%

[Series 2: axial st · axial · 0.67mm/px · z∈[-636,-364]mm · 13 of 160 slices shown, 17 images]
[im 12/160  mediastinal]
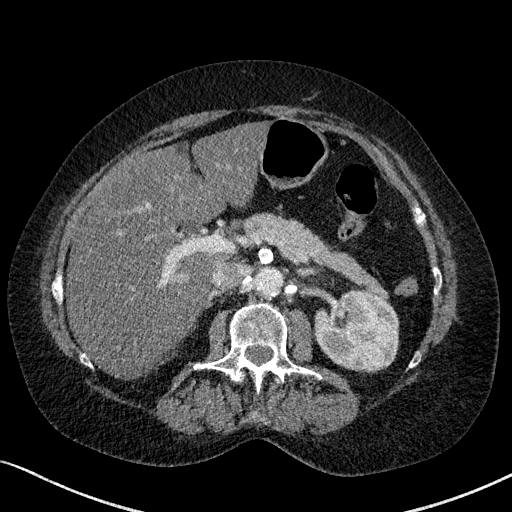
[im 12/160  lung]
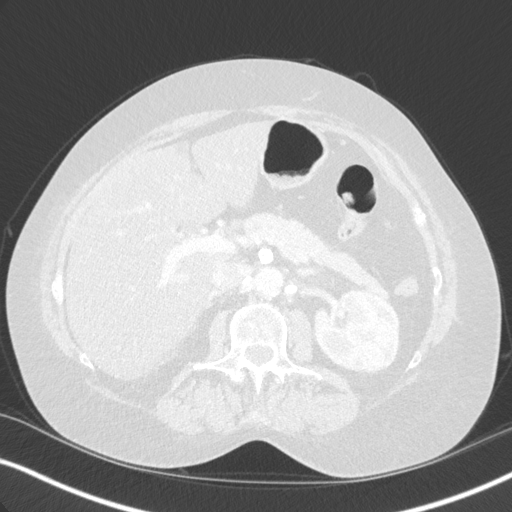
[im 24/160  lung]
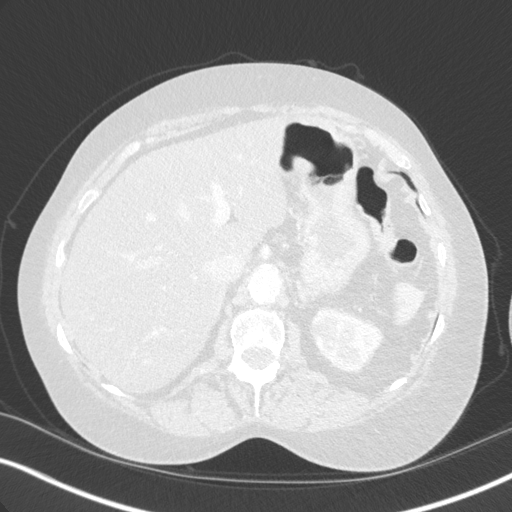
[im 36/160  lung]
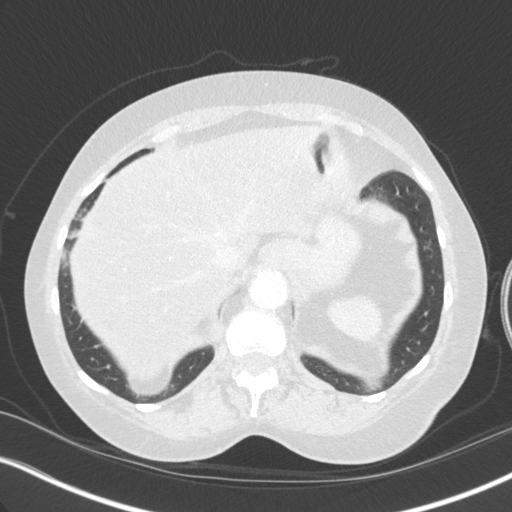
[im 48/160  lung]
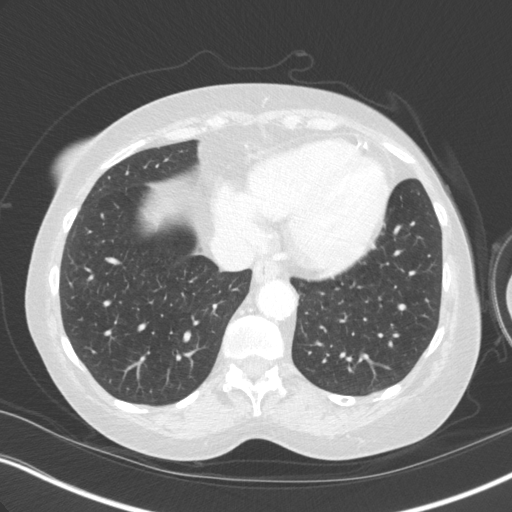
[im 64/160  mediastinal]
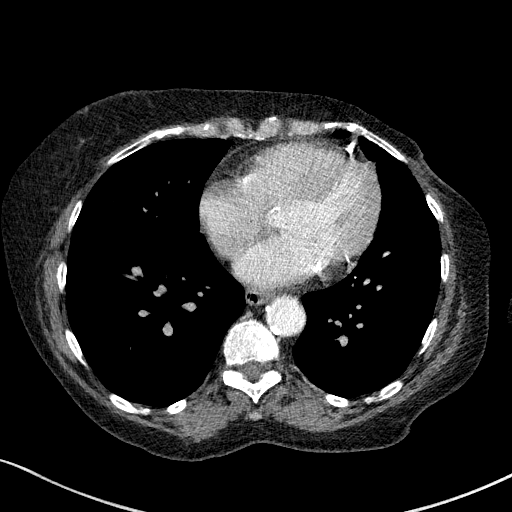
[im 64/160  lung]
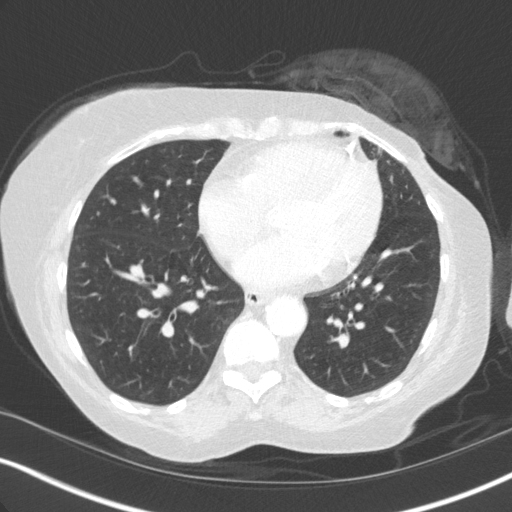
[im 71/160  lung]
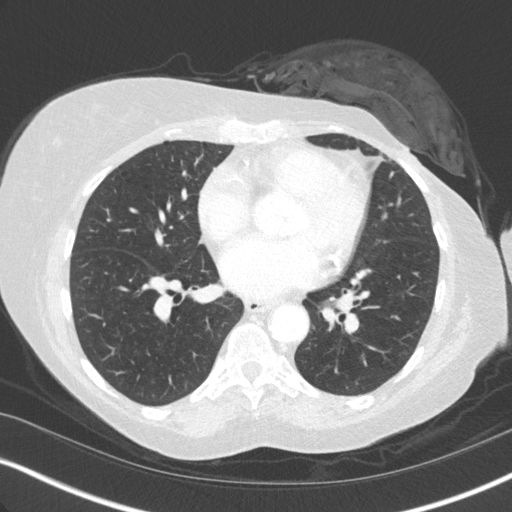
[im 83/160  lung]
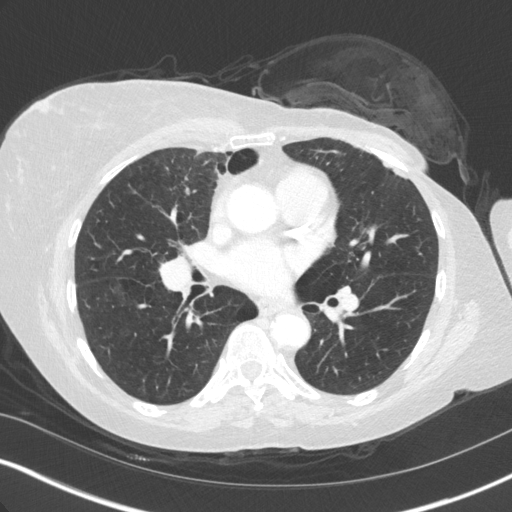
[im 89/160  lung]
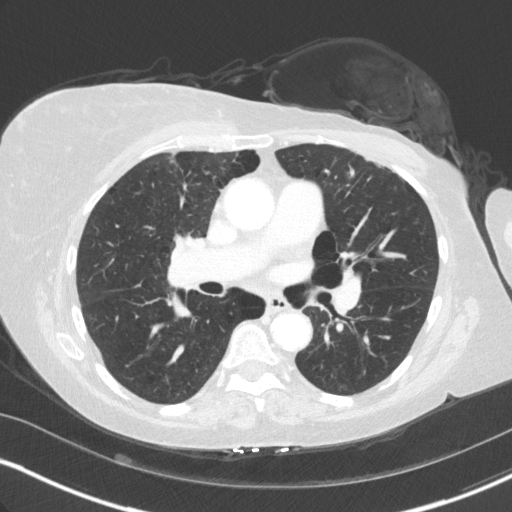
[im 96/160  mediastinal]
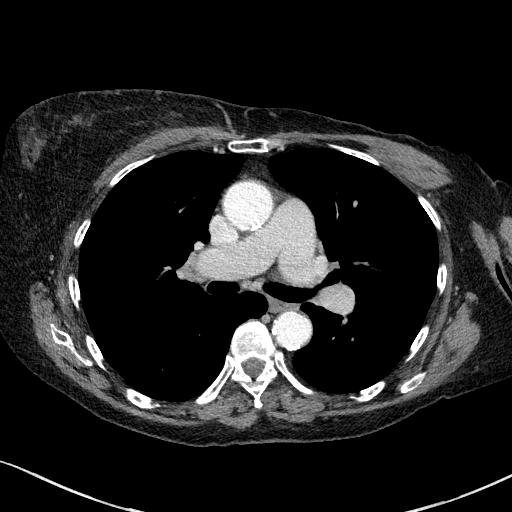
[im 96/160  lung]
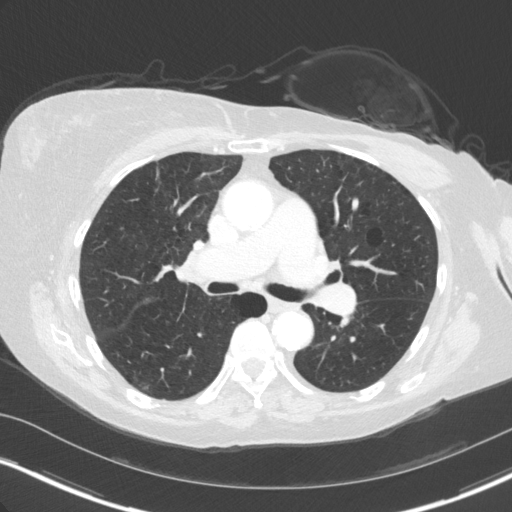
[im 112/160  lung]
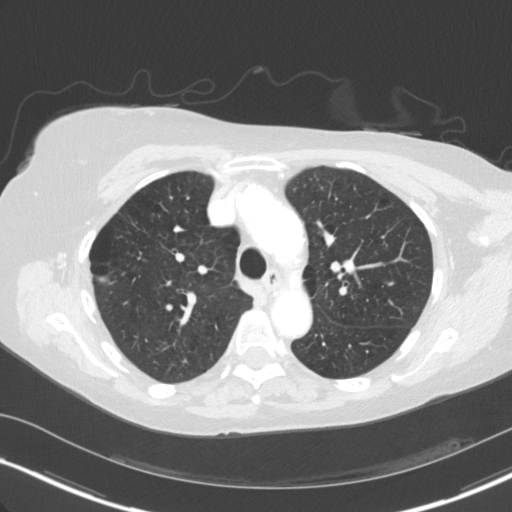
[im 124/160  lung]
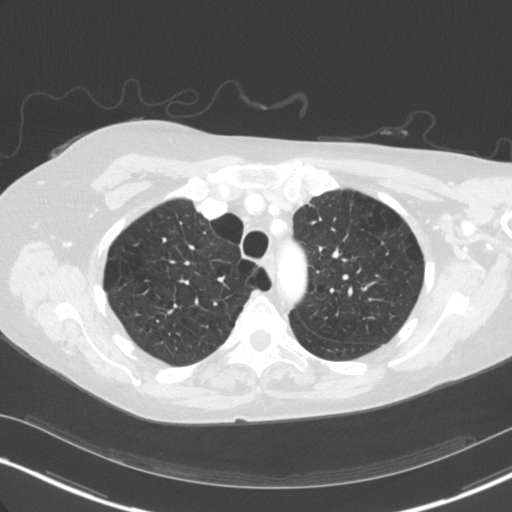
[im 136/160  lung]
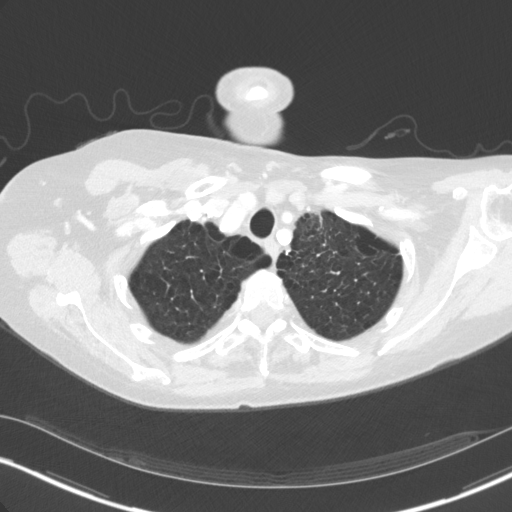
[im 148/160  mediastinal]
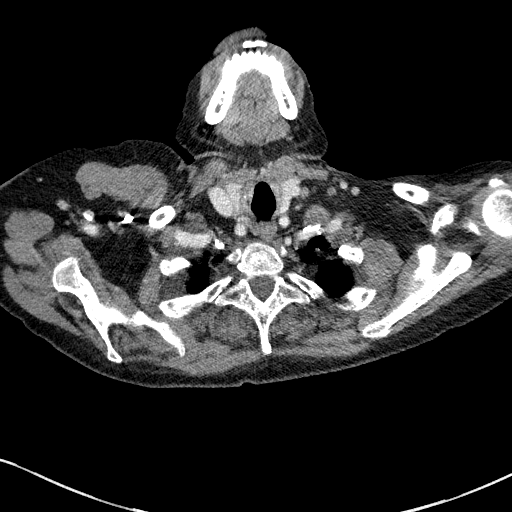
[im 148/160  lung]
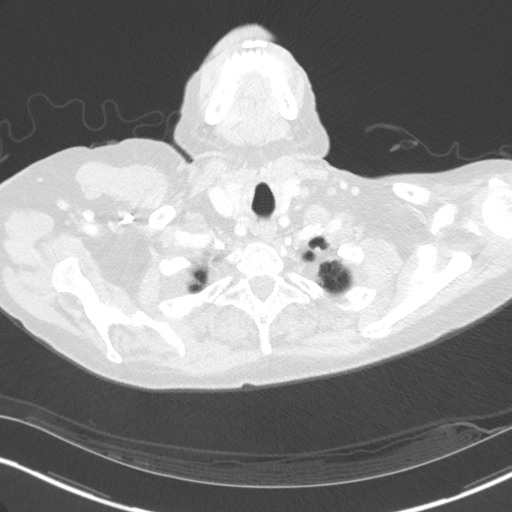

[13 of 34 positions shown; findings below may reference images not displayed]

FINDINGS: Cardiovascular: Heart size is normal. There is no significant
pericardial fluid, thickening or pericardial calcification. There is
aortic atherosclerosis, as well as atherosclerosis of the great
vessels of the mediastinum and the coronary arteries, including
calcified atherosclerotic plaque in the left main, left anterior
descending, left circumflex and right coronary arteries.
Calcifications of the aortic valve and mitral annulus.

Mediastinum/Nodes: No pathologically enlarged mediastinal, internal
mammary or hilar lymph nodes. Esophagus is unremarkable in
appearance. No axillary lymphadenopathy.

Lungs/Pleura: The irregular-shaped area of septal thickening,
ground-glass attenuation and nodular areas of architectural
distortion is again noted in the right upper lobe, today measuring
3.8 x 5.1 cm on axial image 56 of series 3. Previously noted right
middle lobe pulmonary nodule is no longer identified. Previously
noted cluster of nodules in the anterior aspect of the right upper
lobe have resolved, leaving only a small area of architectural
distortion and linear scarring in their [place] (axial image 75 of
series 3). Several other smaller nodules seen on the prior study
have also resolved. There are no new suspicious appearing pulmonary
nodules or masses noted. Diffuse bronchial wall thickening with
moderate centrilobular and paraseptal emphysema. Bilateral apical
nodular pleuroparenchymal thickening and scarring, similar to the
prior examination (left greater than right). No acute consolidative
airspace disease. No pleural effusions.

Upper Abdomen: Severe diffuse low attenuation noted throughout the
visualized hepatic parenchyma, compatible with severe hepatic
steatosis. Aortic atherosclerosis. 1.4 cm simple cyst in the upper
pole of the left kidney.

Musculoskeletal: Status post left modified radical mastectomy. There
are no aggressive appearing lytic or blastic lesions noted in the
visualized portions of the skeleton.
IMPRESSION: 1. Today's study demonstrates a positive response to therapy. All of
the previously noted lung lesions appear smaller, including the
somewhat ill-defined right upper lobe sub solid mass, as detailed
above.
2. Diffuse bronchial wall thickening with moderate centrilobular and
paraseptal emphysema; imaging findings suggestive of underlying
COPD.
3. Aortic atherosclerosis, in addition to left main and 3 vessel
coronary artery disease. Please note that although the presence of
coronary artery calcium documents the presence of coronary artery
disease, the severity of this disease and any potential stenosis
cannot be assessed on this non-gated CT examination. Assessment for
potential risk factor modification, dietary therapy or pharmacologic
therapy may be warranted, if clinically indicated.
4. There are calcifications of the aortic valve and mitral annulus.
Echocardiographic correlation for evaluation of potential valvular
dysfunction may be warranted if clinically indicated.

## 2018-07-17 MED ORDER — ONDANSETRON HCL 4 MG/2ML IJ SOLN
4.0000 mg | Freq: Once | INTRAMUSCULAR | Status: AC
  Administered 2018-07-17: 4 mg via INTRAVENOUS
  Filled 2018-07-17: qty 2

## 2018-07-17 MED ORDER — HYDROMORPHONE HCL 1 MG/ML IJ SOLN
0.5000 mg | INTRAMUSCULAR | Status: DC | PRN
  Administered 2018-07-17 – 2018-07-21 (×4): 0.5 mg via INTRAVENOUS
  Filled 2018-07-17 (×4): qty 1

## 2018-07-17 MED ORDER — SODIUM CHLORIDE 0.9 % IV BOLUS
1000.0000 mL | Freq: Once | INTRAVENOUS | Status: AC
  Administered 2018-07-17: 1000 mL via INTRAVENOUS

## 2018-07-17 MED ORDER — IOHEXOL 300 MG/ML  SOLN
75.0000 mL | Freq: Once | INTRAMUSCULAR | Status: AC | PRN
  Administered 2018-07-17: 75 mL via INTRAVENOUS

## 2018-07-17 NOTE — ED Triage Notes (Signed)
Pt arrives to ED via [facility] from home with c/o LL and RL abdominal pain and emesis x2 days. Pt was recently d/c'd from hospital for SBO, pt states she hasn't been able to eat since then without vomiting. Pt reports vomiting "black matter", EMS reports 200-385mL of black/dark-colored emesis noted upon their arrival. Pt arrives with 22g PIV in right hand, given 4mg  Zofran for nausea en route.

## 2018-07-17 NOTE — ED Notes (Signed)
Patient transported to CT 

## 2018-07-17 NOTE — ED Provider Notes (Addendum)
[facility] Emergency Department Provider Note  ____________________________________________  Time seen: Approximately 9:32 PM  I have reviewed the triage vital signs and the nursing notes.   HISTORY  Chief Complaint Abdominal Pain and Emesis    HPI Danielle [name] is a 62 y.o. female status post ileostomy 7/19, enteric fistula, discharged 9/25 for small bowel obstruction presenting with recurrent nausea and vomiting, decreased ostomy output and abdominal pain.  The patient reports that after discharge she did well for several days until about 3 days ago when she developed nausea and vomiting and noticed that she was having decreased output from her ostomy bag.  Since then, she has noted dark output from her bag and dark vomitus today.  She denies any fever, shortness of breath, lightheadedness or syncope.  Her family tried to give her milk of magnesia and Dulcolax, without any improvement in her symptoms.  Past Medical History:  Diagnosis Date  . Anxiety   . Arthritis   . Cancer ([place]) left    breast cancer 2000, chemo tx's with total mastectomy and lymph nodes resected.   . Cancer of right lung ([place]) 02/21/2016   rad tx's.   . CHF (congestive heart failure) ([place])   . COPD (chronic obstructive pulmonary disease) ([place])   . Dependence on supplemental oxygen   . Depression   . Diabetes mellitus without complication ([place])   . Heart murmur   . Hypertension   . Lung nodule   . Lymphedema   . Shortness of breath dyspnea    with exertion  . Status post chemotherapy 2001   left breast cancer  . Status post radiation therapy 2001   left breast cancer    Patient Active Problem List   Diagnosis Date Noted  . SBO (small bowel obstruction) ([place]) 06/29/2018  . Chronic systolic heart failure ([place]) 06/09/2018  . HTN (hypertension) 06/09/2018  . Atrial fibrillation ([place]) 06/09/2018  . Lymphedema 06/09/2018  . ARF (acute renal failure) ([place]) 06/04/2018  .  Protein-calorie malnutrition, severe 06/04/2018  . Blood in stool   . Focal (segmental) acute (reversible) ischemia of large intestine ([place])   . Ulceration of intestine   . Abdominal pain, right upper quadrant   . Diverticulosis of large intestine without diverticulitis   . Abnormal CT scan, colon   . Generalized abdominal pain   . Colitis   . COPD exacerbation ([place])   . Malnutrition of moderate degree 04/23/2018  . Palliative care by specialist   . DNR (do not resuscitate) discussion   . Weakness generalized   . Hyponatremia 02/10/2017  . Syncope 02/09/2017  . Lung nodule, solitary 07/25/2016  . Malignant neoplasm of upper lobe of right lung ([place]) 07/25/2016  . Carcinoma of overlapping sites of left breast in female, estrogen receptor positive ([place]) 06/22/2016  . Multiple lung nodules 06/22/2016  . Cough   . Lesion of right lung   . Breast cancer in female [facility]) 02/21/2016  . DOE (dyspnea on exertion) 02/14/2016  . Moderate COPD (chronic obstructive pulmonary disease) ([place]) 08/24/2014    Past Surgical History:  Procedure Laterality Date  . Breast Biospy Left    [facility]  . BREAST SURGERY    . COLONOSCOPY N/A 04/30/2018   Procedure: COLONOSCOPY;  Surgeon: Virgel Manifold, MD;  Location: [facility] ENDOSCOPY;  Service: Endoscopy;  Laterality: N/A;  . DILATION AND CURETTAGE OF UTERUS    . ELECTROMAGNETIC NAVIGATION BROCHOSCOPY Right 04/11/2016   Procedure: ELECTROMAGNETIC NAVIGATION BRONCHOSCOPY;  Surgeon: Vilinda Boehringer,  MD;  Location: [facility] ORS;  Service: Cardiopulmonary;  Laterality: Right;  . ESOPHAGOGASTRODUODENOSCOPY (EGD) WITH PROPOFOL N/A 05/07/2018   Procedure: ESOPHAGOGASTRODUODENOSCOPY (EGD) WITH PROPOFOL;  Surgeon: Lucilla Lame, MD;  Location: [facility] ENDOSCOPY;  Service: Endoscopy;  Laterality: N/A;  . history of colonoscopy]    . ILEOSTOMY    . LAPAROTOMY Right 05/04/2018   Procedure: EXPLORATORY LAPAROTOMY right colectomy right and left ostomy;  Surgeon: Herbert Pun, MD;  Location: [facility] ORS;  Service: General;  Laterality: Right;  . LUNG BIOPSY    . MASTECTOMY Left    2000, [facility]  . ROTATOR CUFF REPAIR Right    [facility]    Current Outpatient Rx  . Order #: [id number] Class: Print  . Order #: [id number] Class: Normal  . Order #: [id number] Class: Normal  . Order #: [id number] Class: Normal  . Order #: [id number] Class: Historical Med  . Order #: [id number] Class: Print  . Order #: [id number] Class: Historical Med  . Order #: [id number] Class: Historical Med  . Order #: [id number] Class: Normal  . Order #: [id number] Class: Print    Allergies Patient has no known allergies.  Family History  Problem Relation Age of Onset  . Breast cancer Mother 83  . Cancer Mother        Breast   . Cirrhosis Father   . Breast cancer Paternal Aunt 81  . Cancer Maternal Aunt        Breast     Social History Social History   Tobacco Use  . Smoking status: Former Smoker    Packs/day: 0.50    Years: 20.00    Pack years: 10.00    Types: Cigarettes    Last attempt to quit: 07/02/2012    Years since quitting: 6.0  . Smokeless tobacco: Never Used  . Tobacco comment: quit 2014  Substance Use Topics  . Alcohol use: Yes    Comment: Occasionally  . Drug use: No    Review of Systems Constitutional: No fever/chills.  No lightheadedness or syncope. Eyes: No visual changes. ENT: No sore throat. No congestion or rhinorrhea. Cardiovascular: Denies chest pain. Denies palpitations. Respiratory: Denies shortness of breath.  No cough. Gastrointestinal: Positive diffuse abdominal pain.  Positive nausea, positive vomiting.  Positive dark vomitus.  Positive decreased ostomy output.. Genitourinary: Negative for dysuria. Musculoskeletal: Negative for back pain. Skin: Negative for rash. Neurological: Negative for headaches. No focal numbness, tingling or weakness.     ____________________________________________   PHYSICAL EXAM:  VITAL SIGNS: ED Triage Vitals  Enc  Vitals Group     BP 07/17/18 1959 (!) 133/94     Pulse Rate 07/17/18 1959 (!) 105     Resp 07/17/18 1959 17     Temp 07/17/18 1959 97.9 F (36.6 C)     Temp Source 07/17/18 1959 Oral     SpO2 07/17/18 1959 90 %     Weight 07/17/18 2001 131 lb (59.4 kg)     Height 07/17/18 2001 5\' 7"  (1.702 m)     Head Circumference --      Peak Flow --      Pain Score 07/17/18 2001 10     Pain Loc --      Pain Edu? --      Excl. in [place]? --     Constitutional: Alert and oriented.  Answers questions appropriately.  Chronically ill-appearing. Eyes: Conjunctivae are normal.  EOMI. No scleral icterus. Head: Atraumatic. Nose: No congestion/rhinnorhea. Mouth/Throat: Mucous membranes are moist.  Neck: No stridor.  Supple.  No meningismus. Cardiovascular: Normal  rate, regular rhythm. No murmurs, rubs or gallops.  Respiratory: Normal respiratory effort.  No accessory muscle use or retractions. Lungs CTAB.  No wheezes, rales or ronchi. Gastrointestinal: Soft, and distended.  The patient has an ostomy bag in the right side of the abdomen that has a minimal amount of dark liquid in the bag.  She also has her enteric ostomy bag in the left abdomen.  She has diffuse tenderness to palpation without guarding. No peritoneal signs. Musculoskeletal: No LE edema.  Neurologic:  A&Ox3.  Speech is clear.  Face and smile are symmetric.  EOMI.  Moves all extremities well. Skin:  Skin is warm, dry and intact. No rash noted. Psychiatric: Mood and affect are normal. Speech and behavior are normal.  Normal judgement.  ____________________________________________   LABS (all labs ordered are listed, but only abnormal results are displayed)  Labs Reviewed  CBC - Abnormal; Notable for the following components:      Result Value   WBC 12.7 (*)    RBC 3.52 (*)    Hemoglobin 10.9 (*)    HCT 32.1 (*)    Platelets 502 (*)    All other components within normal limits  COMPREHENSIVE METABOLIC PANEL - Abnormal; Notable for the  following components:   Sodium 129 (*)    CO2 18 (*)    Glucose, Bld 120 (*)    BUN 31 (*)    Creatinine, Ser 1.68 (*)    Total Protein 8.5 (*)    GFR calc non Af Amer 31 (*)    GFR calc Af Amer 36 (*)    All other components within normal limits  POCT GASTRIC OCCULT BLOOD (1-CARD TO LAB) - Abnormal; Notable for the following components:   Occult Blood, Gastric POSITIVE (*)    All other components within normal limits  URINALYSIS, COMPLETE (UACMP) WITH MICROSCOPIC  PROTIME-INR  APTT  POC OCCULT BLOOD, ED  TYPE AND SCREEN   ____________________________________________  EKG  ED ECG REPORT I, Anne-Caroline Mariea Clonts, the attending physician, personally viewed and interpreted this ECG.   Date: 07/18/2018  EKG Time: 2001  Rate: 110  Rhythm: sinus tachycardia  Axis: normal  Intervals:none  ST&T Change: No STEMI   ED ECG REPORT I, Anne-Caroline Mariea Clonts, the attending physician, personally viewed and interpreted this ECG.   Date: 07/17/2018  EKG Time: 2152  Rate: 95  Rhythm: normal sinus rhythm  Axis: normal  Intervals:none  ST&T Change: No STEMI  ____________________________________________  RADIOLOGY  Dg Abdomen 1 View  Result Date: 07/17/2018 CLINICAL DATA:  Obstruction history of SBO EXAM: ABDOMEN - 1 VIEW COMPARISON:  07/07/2018, 07/06/2018, CT 07/05/2018 FINDINGS: Lung bases are clear. No free air beneath the diaphragm. Persistent dilatation of central small bowel up to 3 cm with multiple fluid levels and absence of distal bowel gas consistent with a bowel obstruction. Clips in the pelvis. IMPRESSION: Centralized dilated small bowel loops with fluid levels consistent with small bowel obstruction. Electronically Signed   By: Donavan Foil M.D.   On: 07/17/2018 21:09   Ct Abdomen Pelvis W Contrast  Result Date: 07/17/2018 CLINICAL DATA:  Acute abdominal pain. Vomiting. Recent hospital discharge for small bowel obstruction, unable to tolerate p.o. intake without  vomiting. EXAM: CT ABDOMEN AND PELVIS WITH CONTRAST TECHNIQUE: Multidetector CT imaging of the abdomen and pelvis was performed using the standard protocol following bolus administration of intravenous contrast. CONTRAST:  36mL OMNIPAQUE IOHEXOL 300 MG/ML  SOLN COMPARISON:  Abdominal radiograph earlier this day. Multiple prior CTs  most recently abdominal CT 07/05/2018. PET CT 06/20/2018 FINDINGS: Lower chest: No pleural fluid or consolidation. Hepatobiliary: Unchanged subcentimeter low-density in the central liver likely cyst. Small subcentimeter subcapsular hypodensity in the right lobe is also unchanged but nonspecific. No new hepatic lesion. Gallbladder physiologically distended without wall thickening or calcified gallstone. Borderline biliary dilatation with common bile duct measuring 7 mm distally, similar to prior exam. Pancreas: Parenchymal atrophy. Spleen: No ductal dilatation or inflammation. Slightly small in size without focal abnormality. Adrenals/Urinary Tract: Normal adrenal glands. No hydronephrosis, perinephric edema, or suspicious renal mass. Small cysts in the left kidney. Urinary bladder is nondistended. Stomach/Bowel: Fluid distended distal esophagus. Fluid-filled dilated stomach. Relative decompression of the duodenum and proximal jejunum. The remaining small bowel loops are diffusely dilated, fluid-filled with fluid levels. Small bowel is dilated to the level of the right lower quadrant ileostomy, with mild bowel wall enhancement of the small bowel in the ostomy and subcutaneous tissues. Mesenteric edema involving small bowel primarily in the right abdomen. Tiny peristomal hernia. No pneumatosis or perforation. Diverting transverse colostomy in the left abdomen with decompressed in situ colon. Vascular/Lymphatic: Advanced vascular calcifications of the abdominal aorta, iliac arteries, and aortic branch vessels. Mesenteric vessels are patent. No mesenteric or portal venous gas. No aneurysm.  Circumaortic left renal vein. No abdominopelvic adenopathy. Reproductive: Uterine fundal fibroid.  No adnexal mass. Other: Improved ascites from prior exam with trace free fluid suspected in the right lower quadrant. No free air. Prior laparotomy with abdominal wall scarring. Musculoskeletal: There are no acute or suspicious osseous abnormalities. Mild chronic superior endplate compression fracture of lower most lumbar vertebra (patient past 4 non-rib-bearing lumbar vertebra). IMPRESSION: 1. Diffusely dilated small bowel with fluid levels, suspicious for obstruction, however there is no transition point and small bowel is dilated and fluid-filled throughout to the ileostomy in the right lower quadrant. Atypical appearance of small bowel obstruction is favored given the degree of small bowel distention, however generalized ileus or enteritis are also considered. Improved ascites from prior exam. 2. Advanced aortic and branch atherosclerosis without acute vascular finding. Aortic Atherosclerosis (ICD10-I70.0). Electronically Signed   By: Keith Rake M.D.   On: 07/17/2018 21:53   Dg Abd Portable 1 View  Result Date: 07/17/2018 CLINICAL DATA:  NG tube placement EXAM: PORTABLE ABDOMEN - 1 VIEW COMPARISON:  CT 07/17/2018 FINDINGS: Esophageal tube tip overlies proximal stomach, side-port overlies the distal esophagus. Dilated loops of central small bowel consistent with obstruction. Residual contrast within the renal collecting systems. IMPRESSION: Esophageal tube tip overlies proximal stomach, side-port in the region of the distal esophagus, suggest further advancement for more optimal positioning. Electronically Signed   By: Donavan Foil M.D.   On: 07/17/2018 21:53    ____________________________________________   PROCEDURES  Procedure(s) performed: None  Procedures  Critical Care performed: No ____________________________________________   INITIAL IMPRESSION / ASSESSMENT AND PLAN / ED  COURSE  Pertinent labs & imaging results that were available during my care of the patient were reviewed by me and considered in my medical decision making (see chart for details).  62 y.o. female status post right hemicolectomy and end ileostomy, enteric fistula, 2 recent admissions for small bowel obstruction, presenting with nausea vomiting and abdominal pain, decreased ostomy output.  Overall, I am concerned about repeat small bowel obstruction, and an NG tube has been ordered.  In addition the patient will receive pain medication and antiemetic as well as intravenous fluids.  A repeat CT scan has been ordered for further evaluation.  The patient will be admitted to the hospital for further evaluation and treatment.  ----------------------------------------- 9:41 PM on 07/17/2018 -----------------------------------------  Today, the patient is hyponatremic with a sodium of 129 and she has an acute renal insufficiency with a creatinine of 1.68, increased from 0.74 on 07/08/2018.  She also has an elevated white blood cell count of 12.7, but her entire CBC looks hemoconcentrated, likely due to her nausea and vomiting resulting in dehydration.  The patient's abdominal x-ray does show dilated small bowel loops and I am awaiting the results of her CT scan at this time.  ----------------------------------------- 11:44 PM on 07/17/2018 -----------------------------------------  The patient's nausea has significantly improved after antiemetic and NG tube placement.  Her pain is also improved.  Her gastric contents were Gastroccult did and she is positive.  A type and screen has been ordered, but she has not hemodynamically unstable and her blood counts are reassuring so no transfusion is indicated at this time.  The patient CT scan is consistent with a atypical small bowel obstruction versus ileus or enteritis.  I will speak to the general surgeon on-call and anticipate admission to the hospitalist for  continued conservative management.  ____________________________________________  FINAL CLINICAL IMPRESSION(S) / ED DIAGNOSES  Final diagnoses:  Hyponatremia  Small bowel obstruction ([place])  Acute renal failure, unspecified acute renal failure type ([place])  Hematemesis with nausea         NEW MEDICATIONS STARTED DURING THIS VISIT:  New Prescriptions   No medications on file      Eula Listen, MD 07/17/18 2346    Eula Listen, MD 07/17/18 2357    Eula Listen, MD 07/18/18 [phone]

## 2018-07-18 ENCOUNTER — Other Ambulatory Visit: Payer: Self-pay

## 2018-07-18 DIAGNOSIS — I480 Paroxysmal atrial fibrillation: Secondary | ICD-10-CM | POA: Diagnosis present

## 2018-07-18 DIAGNOSIS — E860 Dehydration: Secondary | ICD-10-CM | POA: Diagnosis present

## 2018-07-18 DIAGNOSIS — K56600 Partial intestinal obstruction, unspecified as to cause: Secondary | ICD-10-CM | POA: Diagnosis present

## 2018-07-18 DIAGNOSIS — E119 Type 2 diabetes mellitus without complications: Secondary | ICD-10-CM | POA: Diagnosis present

## 2018-07-18 DIAGNOSIS — Z66 Do not resuscitate: Secondary | ICD-10-CM | POA: Diagnosis present

## 2018-07-18 DIAGNOSIS — E871 Hypo-osmolality and hyponatremia: Secondary | ICD-10-CM | POA: Diagnosis present

## 2018-07-18 DIAGNOSIS — Z853 Personal history of malignant neoplasm of breast: Secondary | ICD-10-CM | POA: Diagnosis not present

## 2018-07-18 DIAGNOSIS — K559 Vascular disorder of intestine, unspecified: Secondary | ICD-10-CM | POA: Diagnosis present

## 2018-07-18 DIAGNOSIS — N179 Acute kidney failure, unspecified: Secondary | ICD-10-CM | POA: Diagnosis present

## 2018-07-18 DIAGNOSIS — D500 Iron deficiency anemia secondary to blood loss (chronic): Secondary | ICD-10-CM | POA: Diagnosis present

## 2018-07-18 DIAGNOSIS — I700 Atherosclerosis of aorta: Secondary | ICD-10-CM | POA: Diagnosis present

## 2018-07-18 DIAGNOSIS — R188 Other ascites: Secondary | ICD-10-CM | POA: Diagnosis present

## 2018-07-18 DIAGNOSIS — K567 Ileus, unspecified: Secondary | ICD-10-CM | POA: Diagnosis present

## 2018-07-18 DIAGNOSIS — D638 Anemia in other chronic diseases classified elsewhere: Secondary | ICD-10-CM | POA: Diagnosis present

## 2018-07-18 DIAGNOSIS — Z87891 Personal history of nicotine dependence: Secondary | ICD-10-CM | POA: Diagnosis not present

## 2018-07-18 DIAGNOSIS — Z9221 Personal history of antineoplastic chemotherapy: Secondary | ICD-10-CM | POA: Diagnosis not present

## 2018-07-18 DIAGNOSIS — F329 Major depressive disorder, single episode, unspecified: Secondary | ICD-10-CM | POA: Diagnosis present

## 2018-07-18 DIAGNOSIS — I110 Hypertensive heart disease with heart failure: Secondary | ICD-10-CM | POA: Diagnosis present

## 2018-07-18 DIAGNOSIS — I5022 Chronic systolic (congestive) heart failure: Secondary | ICD-10-CM | POA: Diagnosis present

## 2018-07-18 DIAGNOSIS — K920 Hematemesis: Secondary | ICD-10-CM | POA: Diagnosis present

## 2018-07-18 DIAGNOSIS — K210 Gastro-esophageal reflux disease with esophagitis: Secondary | ICD-10-CM | POA: Diagnosis not present

## 2018-07-18 DIAGNOSIS — Z803 Family history of malignant neoplasm of breast: Secondary | ICD-10-CM | POA: Diagnosis not present

## 2018-07-18 DIAGNOSIS — J449 Chronic obstructive pulmonary disease, unspecified: Secondary | ICD-10-CM | POA: Diagnosis present

## 2018-07-18 DIAGNOSIS — Z85118 Personal history of other malignant neoplasm of bronchus and lung: Secondary | ICD-10-CM | POA: Diagnosis not present

## 2018-07-18 LAB — TYPE AND SCREEN
ABO/RH(D): O POS
Antibody Screen: NEGATIVE

## 2018-07-18 LAB — GLUCOSE, CAPILLARY: Glucose-Capillary: 103 mg/dL — ABNORMAL HIGH (ref 70–99)

## 2018-07-18 LAB — APTT: aPTT: 40 seconds — ABNORMAL HIGH (ref 24–36)

## 2018-07-18 LAB — TSH: TSH: 0.98 u[IU]/mL (ref 0.350–4.500)

## 2018-07-18 LAB — PROTIME-INR
INR: 1.53
PROTHROMBIN TIME: 18.3 s — AB (ref 11.4–15.2)

## 2018-07-18 MED ORDER — DOCUSATE SODIUM 100 MG PO CAPS
100.0000 mg | ORAL_CAPSULE | Freq: Two times a day (BID) | ORAL | Status: DC
  Administered 2018-07-18 – 2018-07-21 (×7): 100 mg via ORAL
  Filled 2018-07-18 (×9): qty 1

## 2018-07-18 MED ORDER — FUROSEMIDE 20 MG PO TABS
20.0000 mg | ORAL_TABLET | Freq: Every day | ORAL | Status: DC
  Administered 2018-07-18 – 2018-07-23 (×6): 20 mg via ORAL
  Filled 2018-07-18 (×6): qty 1

## 2018-07-18 MED ORDER — HYDROCODONE-ACETAMINOPHEN 5-325 MG PO TABS
1.0000 | ORAL_TABLET | Freq: Four times a day (QID) | ORAL | Status: DC | PRN
  Administered 2018-07-18 – 2018-07-23 (×14): 1 via ORAL
  Filled 2018-07-18 (×15): qty 1

## 2018-07-18 MED ORDER — ONDANSETRON HCL 4 MG PO TABS: 4.0000 mg | ORAL_TABLET | Freq: Four times a day (QID) | ORAL | Status: DC | PRN

## 2018-07-18 MED ORDER — SODIUM CHLORIDE 0.9 % IV SOLN
INTRAVENOUS | Status: DC
  Administered 2018-07-18 – 2018-07-21 (×9): via INTRAVENOUS

## 2018-07-18 MED ORDER — FERROUS SULFATE 325 (65 FE) MG PO TABS
325.0000 mg | ORAL_TABLET | Freq: Two times a day (BID) | ORAL | Status: DC
  Administered 2018-07-18 – 2018-07-23 (×10): 325 mg via ORAL
  Filled 2018-07-18 (×10): qty 1

## 2018-07-18 MED ORDER — ACETAMINOPHEN 650 MG RE SUPP: 650.0000 mg | Freq: Four times a day (QID) | RECTAL | Status: DC | PRN

## 2018-07-18 MED ORDER — APIXABAN 5 MG PO TABS
5.0000 mg | ORAL_TABLET | Freq: Two times a day (BID) | ORAL | Status: DC
  Administered 2018-07-18: 5 mg via ORAL
  Filled 2018-07-18: qty 1

## 2018-07-18 MED ORDER — APIXABAN 2.5 MG PO TABS
2.5000 mg | ORAL_TABLET | Freq: Two times a day (BID) | ORAL | Status: DC
  Administered 2018-07-18 – 2018-07-19 (×3): 2.5 mg via ORAL
  Filled 2018-07-18 (×3): qty 1

## 2018-07-18 MED ORDER — MEGESTROL ACETATE 400 MG/10ML PO SUSP
300.0000 mg | Freq: Two times a day (BID) | ORAL | Status: DC
  Administered 2018-07-18 – 2018-07-23 (×11): 300 mg via ORAL
  Filled 2018-07-18 (×13): qty 10

## 2018-07-18 MED ORDER — ADULT MULTIVITAMIN W/MINERALS CH
1.0000 | ORAL_TABLET | Freq: Every day | ORAL | Status: DC
  Administered 2018-07-18 – 2018-07-23 (×6): 1 via ORAL
  Filled 2018-07-18 (×6): qty 1

## 2018-07-18 MED ORDER — FLUTICASONE FUROATE-VILANTEROL 200-25 MCG/INH IN AEPB
1.0000 | INHALATION_SPRAY | Freq: Every day | RESPIRATORY_TRACT | Status: DC
  Administered 2018-07-18 – 2018-07-23 (×6): 1 via RESPIRATORY_TRACT
  Filled 2018-07-18: qty 28

## 2018-07-18 MED ORDER — ACETAMINOPHEN 325 MG PO TABS: 650.0000 mg | ORAL_TABLET | Freq: Four times a day (QID) | ORAL | Status: DC | PRN

## 2018-07-18 MED ORDER — CITALOPRAM HYDROBROMIDE 20 MG PO TABS
40.0000 mg | ORAL_TABLET | Freq: Every day | ORAL | Status: DC
  Administered 2018-07-18 – 2018-07-23 (×6): 40 mg via ORAL
  Filled 2018-07-18 (×7): qty 2

## 2018-07-18 MED ORDER — ONDANSETRON HCL 4 MG/2ML IJ SOLN
4.0000 mg | Freq: Four times a day (QID) | INTRAMUSCULAR | Status: DC | PRN
  Administered 2018-07-19: 4 mg via INTRAVENOUS
  Filled 2018-07-18: qty 2

## 2018-07-18 MED ORDER — CARVEDILOL 3.125 MG PO TABS
6.2500 mg | ORAL_TABLET | Freq: Two times a day (BID) | ORAL | Status: DC
  Administered 2018-07-18 – 2018-07-23 (×11): 6.25 mg via ORAL
  Filled 2018-07-18 (×11): qty 2

## 2018-07-18 MED ORDER — ALBUTEROL SULFATE (2.5 MG/3ML) 0.083% IN NEBU: 2.5000 mg | INHALATION_SOLUTION | RESPIRATORY_TRACT | Status: DC | PRN

## 2018-07-18 NOTE — Progress Notes (Signed)
Initial Nutrition Assessment  DOCUMENTATION CODES:   Non-severe (moderate) malnutrition in context of chronic illness  INTERVENTION:  Will monitor for plan of care and for diet advancement.  Once diet able to be advanced recommend providing Nepro Shake po TID, each supplement provides 425 kcal and 19 grams protein. Patient prefers butter pecan flavor.  Continue daily MVI once diet able to be advanced.  Patient will be at risk for refeeding once started on a diet or any dextrose-containing IV fluids. Once started on one of these, recommend monitoring potassium, phosphorus, and magnesium and replacing as needed.  NUTRITION DIAGNOSIS:   Moderate Malnutrition related to chronic illness(remote hx breast cancer s/p chemo/XRT, hx lung cancer s/p XRT, COPD, CHF, exploratory laparotomy on 7/21 with right hemicolectomy, end ileostomy, mucous fistula creation) as evidenced by moderate fat depletion, moderate-severe muscle depletion.  GOAL:   Patient will meet greater than or equal to 90% of their needs  MONITOR:   Diet advancement, Labs, Weight trends, I & O's  REASON FOR ASSESSMENT:   Malnutrition Screening Tool    ASSESSMENT:   62 year old female with PMHx of left breast cancer s/p left mastectomy and chemo/XRT, arthritis, HTN, COPD, anxiety, depression, lymphedema, hx lung cancer s/p XRT, CHF, DM, hx hepatic flexure bleeding ulcer s/p exploratory laparotomy with right hemicolectomy, end ileostomy, and mucous fistula creation on 7/21, recent admissions 9/15-9/18 and 9/21-9/25 for SBO that each resolved with conservative measures, now admitted with possible intermittent obstruction vs paralytic ileus.   -Surgery recommending small bowel follow through.  Met with patient at bedside. She is known to this RD from previous admission where she required TPN 7/19-8/3 and was intubated 7/21-7/25 due to acute respiratory failure following operation. On that admission patient became volume  overloaded from TPN; she gained 25 lbs, developed SOB and required a reduced TPN rate and diuresis. She was also noncompliant with nutrition supplements during that admission. She did have improvement of her appetite at discharge, but was still not eating adequately.  Today patient is much more alert than on assessment last week during previous admission. She reports she was eating small amounts after recent discharge but was still able to eat some. She was eating mashed potatoes, cooked greens, ice cream, and butter pecan Nepro (she really likes the flavor of the Nepro). She reports she began developing abdominal pain, nausea, dark emesis, and decreased ostomy output approximately 2 days PTA. Since the onset of her abdominal pain she has not been eating much.  Today patient reports her UBW had been 156 lbs. She has previously reported it was 148 lbs. Per chart she was 148 lbs (67.3 kg) on 08/24/2016. Weight has fluctuated significantly with volume over recent admissions. She was 70.5 kg on 05/17/2018 (weight falsely elevated from fluid) and lost down to 51.8 kg on 07/02/2018. Patient was 57.5 kg (126.76 lbs) on 07/06/2018. She is currently 59.4 kg (131 lbs).  Patient with 16 Fr. NGT in place at time of RD assessment. It is currently clamped and per chart it has been clamped since 0700 this AM.   Medications reviewed and include: Eliquis, carvedilol, Colace, ferrous sulfate 325 mg BID, Lasix 20 mg daily, Megace 300 mg BID, MVI daily, NS @ 125 mL/hr.  Labs reviewed: CBG 103, Sodium 129, CO2 18, BUN 31, Creatinine 1.68.  I/O: UOP unmeasured; 1350 mL output from ileostomy so far today; no recorded output from NGT  Patient's subcutaneous fat and muscle wasting are worsening. She it at risk of progressing  to severe chronic malnutrition.  NUTRITION - FOCUSED PHYSICAL EXAM:    Most Recent Value  Orbital Region  Moderate depletion  Upper Arm Region  Severe depletion  Thoracic and Lumbar Region  Moderate  depletion  Buccal Region  Moderate depletion  Temple Region  Moderate depletion  Clavicle Bone Region  Moderate depletion  Clavicle and Acromion Bone Region  Moderate depletion  Scapular Bone Region  Moderate depletion  Dorsal Hand  Moderate depletion  Patellar Region  Severe depletion  Anterior Thigh Region  Severe depletion  Posterior Calf Region  Severe depletion  Edema (RD Assessment)  None  Hair  Reviewed  Eyes  Reviewed  Mouth  Reviewed  Skin  Reviewed  Nails  Reviewed     Diet Order:   Diet Order            Diet NPO time specified Except for: Sips with Meds  Diet effective now              EDUCATION NEEDS:   No education needs have been identified at this time  Skin:  Skin Assessment: Reviewed RN Assessment  Last BM:  07/18/2018 - large type 7 in ileostomy  Height:   Ht Readings from Last 1 Encounters:  07/17/18 '5\' 7"'$  (1.702 m)    Weight:   Wt Readings from Last 1 Encounters:  07/17/18 59.4 kg    Ideal Body Weight:  61.4 kg  BMI:  Body mass index is 20.52 kg/m.  Estimated Nutritional Needs:   Kcal:  1535-1770 (MSJ x 1.3-1.5)  Protein:  75-85 grams (1.3-1.5 grams/kg)  Fluid:  1.5 L/day (25 mL/kg)  Willey Blade, MS, RD, LDN Office: [phone] Pager: [phone] After Hours/Weekend Pager: [phone]

## 2018-07-18 NOTE — Progress Notes (Signed)
[facility] [place] NAME: Danielle [name]    MR#:  [id number]  DATE OF BIRTH:  1956/02/02  SUBJECTIVE:  CHIEF COMPLAINT:   Chief Complaint  Patient presents with  . Abdominal Pain  . Emesis    Recurrent admissions for Small bowel obstructions. Have NG tube and having dark liquid out come from ileostomy tube.   REVIEW OF SYSTEMS:  CONSTITUTIONAL: No fever, fatigue or weakness.  EYES: No blurred or double vision.  EARS, NOSE, AND THROAT: No tinnitus or ear pain.  RESPIRATORY: No cough, shortness of breath, wheezing or hemoptysis.  CARDIOVASCULAR: No chest pain, orthopnea, edema.  GASTROINTESTINAL: No nausea, vomiting, diarrhea or abdominal pain.  GENITOURINARY: No dysuria, hematuria.  ENDOCRINE: No polyuria, nocturia,  HEMATOLOGY: No anemia, easy bruising or bleeding SKIN: No rash or lesion. MUSCULOSKELETAL: No joint pain or arthritis.   NEUROLOGIC: No tingling, numbness, weakness.  PSYCHIATRY: No anxiety or depression.   ROS  DRUG ALLERGIES:  No Known Allergies  VITALS:  Blood pressure 108/72, pulse 84, temperature 97.9 F (36.6 C), temperature source Oral, resp. rate 18, height 5\' 7"  (1.702 m), weight 59.4 kg, SpO2 99 %.  PHYSICAL EXAMINATION:  GENERAL:  62 y.o.-year-old patient lying in the bed with no acute distress.  EYES: Pupils equal, round, reactive to light and accommodation. No scleral icterus. Extraocular muscles intact.  HEENT: Head atraumatic, normocephalic. Oropharynx and nasopharynx clear.  NECK:  Supple, no jugular venous distention. No thyroid enlargement, no tenderness.  LUNGS: Normal breath sounds bilaterally, no wheezing, rales,rhonchi or crepitation. No use of accessory muscles of respiration.  CARDIOVASCULAR: S1, S2 normal. No murmurs, rubs, or gallops.  ABDOMEN: Soft, nontender, nondistended. Bowel sounds present. No organomegaly or mass. Ileostomy bag in place and have some dark liquid coming  out. EXTREMITIES: No pedal edema, cyanosis, or clubbing.  NEUROLOGIC: Cranial nerves II through XII are intact. Muscle strength 5/5 in all extremities. Sensation intact. Gait not checked.  PSYCHIATRIC: The patient is alert and oriented x 3.  SKIN: No obvious rash, lesion, or ulcer.   Physical Exam LABORATORY PANEL:   CBC Recent Labs  Lab 07/17/18 2005  WBC 12.7*  HGB 10.9*  HCT 32.1*  PLT 502*   ------------------------------------------------------------------------------------------------------------------  Chemistries  Recent Labs  Lab 07/17/18 2026  NA 129*  K 4.9  CL 99  CO2 18*  GLUCOSE 120*  BUN 31*  CREATININE 1.68*  CALCIUM 9.8  AST 24  ALT 11  ALKPHOS 72  BILITOT 1.0   ------------------------------------------------------------------------------------------------------------------  Cardiac Enzymes No results for input(s): TROPONINI in the last 168 hours. ------------------------------------------------------------------------------------------------------------------  RADIOLOGY:  Dg Abdomen 1 View  Result Date: 07/17/2018 CLINICAL DATA:  Obstruction history of SBO EXAM: ABDOMEN - 1 VIEW COMPARISON:  07/07/2018, 07/06/2018, CT 07/05/2018 FINDINGS: Lung bases are clear. No free air beneath the diaphragm. Persistent dilatation of central small bowel up to 3 cm with multiple fluid levels and absence of distal bowel gas consistent with a bowel obstruction. Clips in the pelvis. IMPRESSION: Centralized dilated small bowel loops with fluid levels consistent with small bowel obstruction. Electronically Signed   By: Donavan Foil M.D.   On: 07/17/2018 21:09   Ct Abdomen Pelvis W Contrast  Result Date: 07/17/2018 CLINICAL DATA:  Acute abdominal pain. Vomiting. Recent hospital discharge for small bowel obstruction, unable to tolerate p.o. intake without vomiting. EXAM: CT ABDOMEN AND PELVIS WITH CONTRAST TECHNIQUE: Multidetector CT imaging of the abdomen and pelvis  was performed using the standard protocol  following bolus administration of intravenous contrast. CONTRAST:  24mL OMNIPAQUE IOHEXOL 300 MG/ML  SOLN COMPARISON:  Abdominal radiograph earlier this day. Multiple prior CTs most recently abdominal CT 07/05/2018. PET CT 06/20/2018 FINDINGS: Lower chest: No pleural fluid or consolidation. Hepatobiliary: Unchanged subcentimeter low-density in the central liver likely cyst. Small subcentimeter subcapsular hypodensity in the right lobe is also unchanged but nonspecific. No new hepatic lesion. Gallbladder physiologically distended without wall thickening or calcified gallstone. Borderline biliary dilatation with common bile duct measuring 7 mm distally, similar to prior exam. Pancreas: Parenchymal atrophy. Spleen: No ductal dilatation or inflammation. Slightly small in size without focal abnormality. Adrenals/Urinary Tract: Normal adrenal glands. No hydronephrosis, perinephric edema, or suspicious renal mass. Small cysts in the left kidney. Urinary bladder is nondistended. Stomach/Bowel: Fluid distended distal esophagus. Fluid-filled dilated stomach. Relative decompression of the duodenum and proximal jejunum. The remaining small bowel loops are diffusely dilated, fluid-filled with fluid levels. Small bowel is dilated to the level of the right lower quadrant ileostomy, with mild bowel wall enhancement of the small bowel in the ostomy and subcutaneous tissues. Mesenteric edema involving small bowel primarily in the right abdomen. Tiny peristomal hernia. No pneumatosis or perforation. Diverting transverse colostomy in the left abdomen with decompressed in situ colon. Vascular/Lymphatic: Advanced vascular calcifications of the abdominal aorta, iliac arteries, and aortic branch vessels. Mesenteric vessels are patent. No mesenteric or portal venous gas. No aneurysm. Circumaortic left renal vein. No abdominopelvic adenopathy. Reproductive: Uterine fundal fibroid.  No adnexal mass.  Other: Improved ascites from prior exam with trace free fluid suspected in the right lower quadrant. No free air. Prior laparotomy with abdominal wall scarring. Musculoskeletal: There are no acute or suspicious osseous abnormalities. Mild chronic superior endplate compression fracture of lower most lumbar vertebra (patient past 4 non-rib-bearing lumbar vertebra). IMPRESSION: 1. Diffusely dilated small bowel with fluid levels, suspicious for obstruction, however there is no transition point and small bowel is dilated and fluid-filled throughout to the ileostomy in the right lower quadrant. Atypical appearance of small bowel obstruction is favored given the degree of small bowel distention, however generalized ileus or enteritis are also considered. Improved ascites from prior exam. 2. Advanced aortic and branch atherosclerosis without acute vascular finding. Aortic Atherosclerosis (ICD10-I70.0). Electronically Signed   By: Keith Rake M.D.   On: 07/17/2018 21:53   Dg Abd Portable 1 View  Result Date: 07/17/2018 CLINICAL DATA:  NG tube placement EXAM: PORTABLE ABDOMEN - 1 VIEW COMPARISON:  CT 07/17/2018 FINDINGS: Esophageal tube tip overlies proximal stomach, side-port overlies the distal esophagus. Dilated loops of central small bowel consistent with obstruction. Residual contrast within the renal collecting systems. IMPRESSION: Esophageal tube tip overlies proximal stomach, side-port in the region of the distal esophagus, suggest further advancement for more optimal positioning. Electronically Signed   By: Donavan Foil M.D.   On: 07/17/2018 21:53    ASSESSMENT AND PLAN:   Active Problems:   SBO (small bowel obstruction) ([place])   This is a 62 year old female admitted for small bowel obstruction. 1.  SBO: Continue NG tube decompression of the bowel.  N.p.o. status for now.   further recommendations from surgical service. 2.  AKI: Likely secondary to dehydration/recurrent vomiting.  Hydrate with  intravenous fluid.  Avoid nephrotoxic agents. 3.  COPD: Continue inhaled corticosteroid  4.  Depression: Continue Celexa 5.  DVT prophylaxis: Eliquis 6.  GI prophylaxis: None 7. Have guiac positive on gastric content on vomit.  This could be due to Iron tablets.  Surgical team have suggested this si chronic dark stool, monitor HB.   All the records are reviewed and case discussed with Care Management/Social Workerr. Management plans discussed with the patient, family and they are in agreement.  CODE STATUS: DNR  TOTAL TIME TAKING CARE OF THIS PATIENT: 35 minutes.     POSSIBLE D/C IN 1-2 DAYS, DEPENDING ON CLINICAL CONDITION.   [name] M.D on 07/18/2018   Between 7am to 6pm - Pager - [phone]  After 6pm go to [email or web address] - password [email or web address]  Office  [phone]  CC: Primary care physician; Elisabeth Cara, NP  Note: This dictation was prepared with Dragon dictation along with smaller phrase technology. Any transcriptional errors that result from this process are unintentional.

## 2018-07-18 NOTE — Consult Note (Signed)
Subjective:   CC: obstruction  HPI:  Danielle [name] is a 62 y.o. female who was consulted by Mariea Clonts for issue above.  Symptoms were first noted 1 day ago. Pain is in abdomen, not specifically localized.  Associated with nausea/vomting, exacerbated by nothing specfic.  Feeling better after NG decompression.     Past Medical History:  has a past medical history of Anxiety, Arthritis, Cancer ([place]) (left ), Cancer of right lung ([place]) (02/21/2016), CHF (congestive heart failure) ([place]), COPD (chronic obstructive pulmonary disease) ([place]), Dependence on supplemental oxygen, Depression, Diabetes mellitus without complication ([place]), Heart murmur, Hypertension, Lung nodule, Lymphedema, Shortness of breath dyspnea, Status post chemotherapy (2001), and Status post radiation therapy (2001).  Past Surgical History:  Past Surgical History:  Procedure Laterality Date  . Breast Biospy Left    [facility]  . BREAST SURGERY    . COLONOSCOPY N/A 04/30/2018   Procedure: COLONOSCOPY;  Surgeon: Virgel Manifold, MD;  Location: [facility] ENDOSCOPY;  Service: Endoscopy;  Laterality: N/A;  . DILATION AND CURETTAGE OF UTERUS    . ELECTROMAGNETIC NAVIGATION BROCHOSCOPY Right 04/11/2016   Procedure: ELECTROMAGNETIC NAVIGATION BRONCHOSCOPY;  Surgeon: Vilinda Boehringer, MD;  Location: [facility] ORS;  Service: Cardiopulmonary;  Laterality: Right;  . ESOPHAGOGASTRODUODENOSCOPY (EGD) WITH PROPOFOL N/A 05/07/2018   Procedure: ESOPHAGOGASTRODUODENOSCOPY (EGD) WITH PROPOFOL;  Surgeon: Lucilla Lame, MD;  Location: [facility] ENDOSCOPY;  Service: Endoscopy;  Laterality: N/A;  . history of colonoscopy]    . ILEOSTOMY    . LAPAROTOMY Right 05/04/2018   Procedure: EXPLORATORY LAPAROTOMY right colectomy right and left ostomy;  Surgeon: Herbert Pun, MD;  Location: [facility] ORS;  Service: General;  Laterality: Right;  . LUNG BIOPSY    . MASTECTOMY Left    2000, [facility]  . ROTATOR CUFF REPAIR Right    [facility]    Family History: family history includes  Breast cancer (age of onset: 30) in her paternal aunt; Breast cancer (age of onset: 76) in her mother; Cancer in her maternal aunt and mother; Cirrhosis in her father.  Social History:  reports that she quit smoking about 6 years ago. Her smoking use included cigarettes. She has a 10.00 pack-year smoking history. She has never used smokeless tobacco. She reports that she drinks alcohol. She reports that she does not use drugs.  Current Medications:  Medications Prior to Admission  Medication Sig Dispense Refill  . albuterol (PROVENTIL HFA;VENTOLIN HFA) 108 (90 Base) MCG/ACT inhaler Inhale 2 puffs into the lungs every 6 (six) hours as needed for wheezing or shortness of breath. 1 Inhaler 2  . apixaban (ELIQUIS) 5 MG TABS tablet Take 1 tablet (5 mg total) by mouth 2 (two) times daily. 60 tablet 0  . budesonide-formoterol (SYMBICORT) 160-4.5 MCG/ACT inhaler Inhale 2 puffs into the lungs 2 (two) times daily. 1 Inhaler 12  . carvedilol (COREG) 6.25 MG tablet Take 1 tablet (6.25 mg total) by mouth 2 (two) times daily. 60 tablet 3  . citalopram (CELEXA) 40 MG tablet Take 40 mg by mouth daily.    . ferrous sulfate 325 (65 FE) MG tablet Take 1 tablet (325 mg total) by mouth 2 (two) times daily with a meal. 60 tablet 3  . furosemide (LASIX) 20 MG tablet Take 20 mg by mouth daily.     [name] HYDROcodone-acetaminophen (NORCO/VICODIN) 5-325 MG tablet Take 1 tablet by mouth every 6 (six) hours as needed for moderate pain or severe pain.    . megestrol (MEGACE) 400 MG/10ML suspension Take 7.5 mLs (300 mg total) by mouth  2 (two) times daily. 240 mL 0  . Multiple Vitamin (MULTIVITAMIN WITH MINERALS) TABS tablet Take 1 tablet by mouth daily. 30 tablet 1    Allergies:  Allergies as of 07/17/2018  . (No Known Allergies)    ROS:  A 15 point review of systems was performed and pertinent positives and negatives noted in HPI   Objective:     BP (!) 154/88 (BP Location: Right Arm)   Pulse (!) 102   Temp 97.6 F  (36.4 C) (Oral)   Resp (!) 21   Ht 5\' 7"  (1.702 m)   Wt 59.4 kg   SpO2 96%   BMI 20.52 kg/m   Constitutional :  alert, cooperative, appears stated age and no distress  Lymphatics/Throat:  no asymmetry, masses, or scars  Respiratory:  clear to auscultation bilaterally  Cardiovascular:  irregularly irregular rhythm  Gastrointestinal: soft, non-tender; bowel sounds normal; no masses,  no organomegaly.   Musculoskeletal: Steady gait and movement  Skin: Cool and moist  Psychiatric: Normal affect, non-agitated, not confused       LABS:  CMP Latest Ref Rng & Units 07/17/2018 07/09/2018 07/08/2018  Glucose 70 - 99 mg/dL 120(H) - 94  BUN 8 - 23 mg/dL 31(H) - 6(L)  Creatinine 0.44 - 1.00 mg/dL 1.68(H) - 0.74  Sodium 135 - 145 mmol/L 129(L) - 138  Potassium 3.5 - 5.1 mmol/L 4.9 3.7 2.8(L)  Chloride 98 - 111 mmol/L 99 - 119(H)  CO2 22 - 32 mmol/L 18(L) - 16(L)  Calcium 8.9 - 10.3 mg/dL 9.8 - 8.0(L)  Total Protein 6.5 - 8.1 g/dL 8.5(H) - -  Total Bilirubin 0.3 - 1.2 mg/dL 1.0 - -  Alkaline Phos 38 - 126 U/L 72 - -  AST 15 - 41 U/L 24 - -  ALT 0 - 44 U/L 11 - -   CBC Latest Ref Rng & Units 07/17/2018 07/09/2018 07/06/2018  WBC 3.6 - 11.0 K/uL 12.7(H) 7.1 8.4  Hemoglobin 12.0 - 16.0 g/dL 10.9(L) 8.0(L) 8.2(L)  Hematocrit 35.0 - 47.0 % 32.1(L) 24.7(L) 24.6(L)  Platelets 150 - 440 K/uL 502(H) 285 283    RADS: CLINICAL DATA:  Acute abdominal pain. Vomiting. Recent hospital discharge for small bowel obstruction, unable to tolerate p.o. intake without vomiting.  EXAM: CT ABDOMEN AND PELVIS WITH CONTRAST  TECHNIQUE: Multidetector CT imaging of the abdomen and pelvis was performed using the standard protocol following bolus administration of intravenous contrast.  CONTRAST:  31mL OMNIPAQUE IOHEXOL 300 MG/ML  SOLN  COMPARISON:  Abdominal radiograph earlier this day. Multiple prior CTs most recently abdominal CT 07/05/2018. PET CT 06/20/2018  FINDINGS: Lower chest: No pleural  fluid or consolidation.  Hepatobiliary: Unchanged subcentimeter low-density in the central liver likely cyst. Small subcentimeter subcapsular hypodensity in the right lobe is also unchanged but nonspecific. No new hepatic lesion. Gallbladder physiologically distended without wall thickening or calcified gallstone. Borderline biliary dilatation with common bile duct measuring 7 mm distally, similar to prior exam.  Pancreas: Parenchymal atrophy.  Spleen: No ductal dilatation or inflammation. Slightly small in size without focal abnormality.  Adrenals/Urinary Tract: Normal adrenal glands. No hydronephrosis, perinephric edema, or suspicious renal mass. Small cysts in the left kidney. Urinary bladder is nondistended.  Stomach/Bowel: Fluid distended distal esophagus. Fluid-filled dilated stomach. Relative decompression of the duodenum and proximal jejunum. The remaining small bowel loops are diffusely dilated, fluid-filled with fluid levels. Small bowel is dilated to the level of the right lower quadrant ileostomy, with mild bowel wall enhancement of the  small bowel in the ostomy and subcutaneous tissues. Mesenteric edema involving small bowel primarily in the right abdomen. Tiny peristomal hernia. No pneumatosis or perforation. Diverting transverse colostomy in the left abdomen with decompressed in situ colon.  Vascular/Lymphatic: Advanced vascular calcifications of the abdominal aorta, iliac arteries, and aortic branch vessels. Mesenteric vessels are patent. No mesenteric or portal venous gas. No aneurysm. Circumaortic left renal vein. No abdominopelvic adenopathy.  Reproductive: Uterine fundal fibroid.  No adnexal mass.  Other: Improved ascites from prior exam with trace free fluid suspected in the right lower quadrant. No free air. Prior laparotomy with abdominal wall scarring.  Musculoskeletal: There are no acute or suspicious osseous abnormalities. Mild chronic  superior endplate compression fracture of lower most lumbar vertebra (patient past 4 non-rib-bearing lumbar vertebra).  IMPRESSION: 1. Diffusely dilated small bowel with fluid levels, suspicious for obstruction, however there is no transition point and small bowel is dilated and fluid-filled throughout to the ileostomy in the right lower quadrant. Atypical appearance of small bowel obstruction is favored given the degree of small bowel distention, however generalized ileus or enteritis are also considered. Improved ascites from prior exam. 2. Advanced aortic and branch atherosclerosis without acute vascular finding. Aortic Atherosclerosis (ICD10-I70.0).   Electronically Signed   By: Keith Rake M.D.   On: 07/17/2018 21:53  Assessment:   Nausea/vomiting, possible recurrent SBO Hyponatremia Positive heme occult gastric contents  Plan:    No specific transition point noted on CT after reviewing it myself.  Ostomy still productive, just decreased output, so possible intermittent obtstruction vs. Paralytic ileus from unknown cause.  Recommend small bowel followthrough to see if we can determine specific transition point prior to any discussion of surgical intervention since she is high risk candidate for repeat surgery.  Per dr. Peyton Najjar, daughter is not requesting proceeding with ileostomy reversal.  Positive heme occult likely from recent Fe supplementation.  Dark output from NG and ileostomy is unchanged from previou exams, per Dr. Peyton Najjar.  Recommend continue monitoring with serial hgb.  Continue NG tube decompression for now

## 2018-07-18 NOTE — Care Management Note (Signed)
Case Management Note  Patient Details  Name: TASHI ANDUJO MRN: [id number] Date of Birth: 10-Jun-1956  Subjective/Objective:   Met with patient at bedside to discuss that she is uninsured. Patient states she went this week to the medication management clinic and completed an application and turned in needed paperwork. She was to follow up with them but was admitted.  She has no PCP. [facility] Patient instructed to be aware that they will be calling her after discharge. Gave her another [facility] application for reference of address and phone number to clinic. Patient very appreciative of assistance. Will follow progression.                 Action/Plan:   Expected Discharge Date:  07/20/18               Expected Discharge Plan:     In-House Referral:     Discharge planning Services  CM Consult, [facility]  Post Acute Care Choice:    Choice offered to:  Patient  DME Arranged:    DME Agency:     HH Arranged:    [place] Agency:     Status of Service:  In process, will continue to follow  If discussed at Long Length of Stay Meetings, dates discussed:    Additional Comments:  Jolly Mango, RN 07/18/2018, 3:14 PM

## 2018-07-18 NOTE — ED Notes (Signed)
Informed pt of need for urine sample to perform UA; pt reports she does not feel the urge to urinate at this time but will make staff aware when she feels the need to void.

## 2018-07-18 NOTE — H&P (Signed)
Danielle [name] is an 62 y.o. female.   Chief Complaint: Vomiting HPI: The patient well-known to the hospitalist service presents to the emergency department a few days after discharge for small bowel obstruction complaining of couple episodes of dark-colored emesis and ostomy output.  The patient states that she has been unable to eat for a few days.  She denies fever but admits to mild abdominal pain.  An NG tube was placed in the emergency department prior to the treatment team calling the hospitalist service for admission.  Past Medical History:  Diagnosis Date  . Anxiety   . Arthritis   . Cancer ([place]) left    breast cancer 2000, chemo tx's with total mastectomy and lymph nodes resected.   . Cancer of right lung ([place]) 02/21/2016   rad tx's.   . CHF (congestive heart failure) ([place])   . COPD (chronic obstructive pulmonary disease) ([place])   . Dependence on supplemental oxygen   . Depression   . Diabetes mellitus without complication ([place])   . Heart murmur   . Hypertension   . Lung nodule   . Lymphedema   . Shortness of breath dyspnea    with exertion  . Status post chemotherapy 2001   left breast cancer  . Status post radiation therapy 2001   left breast cancer    Past Surgical History:  Procedure Laterality Date  . Breast Biospy Left    [facility]  . BREAST SURGERY    . COLONOSCOPY N/A 04/30/2018   Procedure: COLONOSCOPY;  Surgeon: Virgel Manifold, MD;  Location: [facility] ENDOSCOPY;  Service: Endoscopy;  Laterality: N/A;  . DILATION AND CURETTAGE OF UTERUS    . ELECTROMAGNETIC NAVIGATION BROCHOSCOPY Right 04/11/2016   Procedure: ELECTROMAGNETIC NAVIGATION BRONCHOSCOPY;  Surgeon: Vilinda Boehringer, MD;  Location: [facility] ORS;  Service: Cardiopulmonary;  Laterality: Right;  . ESOPHAGOGASTRODUODENOSCOPY (EGD) WITH PROPOFOL N/A 05/07/2018   Procedure: ESOPHAGOGASTRODUODENOSCOPY (EGD) WITH PROPOFOL;  Surgeon: Lucilla Lame, MD;  Location: [facility] ENDOSCOPY;  Service: Endoscopy;  Laterality: N/A;  .  history of colonoscopy]    . ILEOSTOMY    . LAPAROTOMY Right 05/04/2018   Procedure: EXPLORATORY LAPAROTOMY right colectomy right and left ostomy;  Surgeon: Herbert Pun, MD;  Location: [facility] ORS;  Service: General;  Laterality: Right;  . LUNG BIOPSY    . MASTECTOMY Left    2000, [facility]  . ROTATOR CUFF REPAIR Right    [facility]    Family History  Problem Relation Age of Onset  . Breast cancer Mother 44  . Cancer Mother        Breast   . Cirrhosis Father   . Breast cancer Paternal Aunt 35  . Cancer Maternal Aunt        Breast    Social History:  reports that she quit smoking about 6 years ago. Her smoking use included cigarettes. She has a 10.00 pack-year smoking history. She has never used smokeless tobacco. She reports that she drinks alcohol. She reports that she does not use drugs.  Allergies: No Known Allergies  Medications Prior to Admission  Medication Sig Dispense Refill  . albuterol (PROVENTIL HFA;VENTOLIN HFA) 108 (90 Base) MCG/ACT inhaler Inhale 2 puffs into the lungs every 6 (six) hours as needed for wheezing or shortness of breath. 1 Inhaler 2  . apixaban (ELIQUIS) 5 MG TABS tablet Take 1 tablet (5 mg total) by mouth 2 (two) times daily. 60 tablet 0  . budesonide-formoterol (SYMBICORT) 160-4.5 MCG/ACT inhaler Inhale 2 puffs into the lungs 2 (  two) times daily. 1 Inhaler 12  . carvedilol (COREG) 6.25 MG tablet Take 1 tablet (6.25 mg total) by mouth 2 (two) times daily. 60 tablet 3  . citalopram (CELEXA) 40 MG tablet Take 40 mg by mouth daily.    . ferrous sulfate 325 (65 FE) MG tablet Take 1 tablet (325 mg total) by mouth 2 (two) times daily with a meal. 60 tablet 3  . furosemide (LASIX) 20 MG tablet Take 20 mg by mouth daily.     [name] HYDROcodone-acetaminophen (NORCO/VICODIN) 5-325 MG tablet Take 1 tablet by mouth every 6 (six) hours as needed for moderate pain or severe pain.    . megestrol (MEGACE) 400 MG/10ML suspension Take 7.5 mLs (300 mg total) by mouth 2 (two) times  daily. 240 mL 0  . Multiple Vitamin (MULTIVITAMIN WITH MINERALS) TABS tablet Take 1 tablet by mouth daily. 30 tablet 1    Results for orders placed or performed during the hospital encounter of 07/17/18 (from the past 48 hour(s))  CBC     Status: Abnormal   Collection Time: 07/17/18  8:05 PM  Result Value Ref Range   WBC 12.7 (H) 3.6 - 11.0 K/uL   RBC 3.52 (L) 3.80 - 5.20 MIL/uL   Hemoglobin 10.9 (L) 12.0 - 16.0 g/dL   HCT 32.1 (L) 35.0 - 47.0 %   MCV 91.1 80.0 - 100.0 fL   MCH 31.0 26.0 - 34.0 pg   MCHC 34.0 32.0 - 36.0 g/dL   RDW 14.5 11.5 - 14.5 %   Platelets 502 (H) 150 - 440 K/uL    Comment: Performed at [facility], [address] [place], [place] [address]  Comprehensive metabolic panel     Status: Abnormal   Collection Time: 07/17/18  8:26 PM  Result Value Ref Range   Sodium 129 (L) 135 - 145 mmol/L   Potassium 4.9 3.5 - 5.1 mmol/L    Comment: HEMOLYSIS AT THIS LEVEL MAY AFFECT RESULT   Chloride 99 98 - 111 mmol/L   CO2 18 (L) 22 - 32 mmol/L   Glucose, Bld 120 (H) 70 - 99 mg/dL   BUN 31 (H) 8 - 23 mg/dL   Creatinine, Ser 1.68 (H) 0.44 - 1.00 mg/dL   Calcium 9.8 8.9 - 10.3 mg/dL   Total Protein 8.5 (H) 6.5 - 8.1 g/dL   Albumin 4.6 3.5 - 5.0 g/dL   AST 24 15 - 41 U/L   ALT 11 0 - 44 U/L   Alkaline Phosphatase 72 38 - 126 U/L   Total Bilirubin 1.0 0.3 - 1.2 mg/dL   GFR calc non Af Amer 31 (L) >60 mL/min   GFR calc Af Amer 36 (L) >60 mL/min    Comment: (NOTE) The eGFR has been calculated using the CKD EPI equation. This calculation has not been validated in all clinical situations. eGFR's persistently <60 mL/min signify possible Chronic Kidney Disease.    Anion gap 12 5 - 15    Comment: Performed at [facility], [address] [place], [place] [address]  POCT gastric occult blood(1-card to lab)     Status: Abnormal   Collection Time: 07/17/18  9:45 PM  Result Value Ref Range   pH, Gastric 2    Occult Blood, Gastric POSITIVE (A) NEGATIVE     Comment: Performed at [facility], [address] [place],  [address]  Type and screen [place]     Status: None   Collection Time: 07/18/18  12:01 AM  Result Value Ref Range   ABO/RH(D) O POS    Antibody Screen NEG    Sample Expiration      07/21/2018 Performed at [facility], [address] [place], [place] [address]   Protime-INR     Status: Abnormal   Collection Time: 07/18/18 12:01 AM  Result Value Ref Range   Prothrombin Time 18.3 (H) 11.4 - 15.2 seconds   INR 1.53     Comment: Performed at [facility], [address] [place], [place] [address]  APTT     Status: Abnormal   Collection Time: 07/18/18 12:01 AM  Result Value Ref Range   aPTT 40 (H) 24 - 36 seconds    Comment:        IF BASELINE aPTT IS ELEVATED, SUGGEST PATIENT RISK ASSESSMENT BE USED TO DETERMINE APPROPRIATE ANTICOAGULANT THERAPY. Performed at [facility], [address] [place],  [address]    Dg Abdomen 1 View  Result Date: 07/17/2018 CLINICAL DATA:  Obstruction history of SBO EXAM: ABDOMEN - 1 VIEW COMPARISON:  07/07/2018, 07/06/2018, CT 07/05/2018 FINDINGS: Lung bases are clear. No free air beneath the diaphragm. Persistent dilatation of central small bowel up to 3 cm with multiple fluid levels and absence of distal bowel gas consistent with a bowel obstruction. Clips in the pelvis. IMPRESSION: Centralized dilated small bowel loops with fluid levels consistent with small bowel obstruction. Electronically Signed   By: Donavan Foil M.D.   On: 07/17/2018 21:09   Ct Abdomen Pelvis W Contrast  Result Date: 07/17/2018 CLINICAL DATA:  Acute abdominal pain. Vomiting. Recent hospital discharge for small bowel obstruction, unable to tolerate p.o. intake without vomiting. EXAM: CT ABDOMEN AND PELVIS WITH CONTRAST TECHNIQUE: Multidetector CT imaging of the abdomen and pelvis was performed using the standard protocol following bolus  administration of intravenous contrast. CONTRAST:  71m OMNIPAQUE IOHEXOL 300 MG/ML  SOLN COMPARISON:  Abdominal radiograph earlier this day. Multiple prior CTs most recently abdominal CT 07/05/2018. PET CT 06/20/2018 FINDINGS: Lower chest: No pleural fluid or consolidation. Hepatobiliary: Unchanged subcentimeter low-density in the central liver likely cyst. Small subcentimeter subcapsular hypodensity in the right lobe is also unchanged but nonspecific. No new hepatic lesion. Gallbladder physiologically distended without wall thickening or calcified gallstone. Borderline biliary dilatation with common bile duct measuring 7 mm distally, similar to prior exam. Pancreas: Parenchymal atrophy. Spleen: No ductal dilatation or inflammation. Slightly small in size without focal abnormality. Adrenals/Urinary Tract: Normal adrenal glands. No hydronephrosis, perinephric edema, or suspicious renal mass. Small cysts in the left kidney. Urinary bladder is nondistended. Stomach/Bowel: Fluid distended distal esophagus. Fluid-filled dilated stomach. Relative decompression of the duodenum and proximal jejunum. The remaining small bowel loops are diffusely dilated, fluid-filled with fluid levels. Small bowel is dilated to the level of the right lower quadrant ileostomy, with mild bowel wall enhancement of the small bowel in the ostomy and subcutaneous tissues. Mesenteric edema involving small bowel primarily in the right abdomen. Tiny peristomal hernia. No pneumatosis or perforation. Diverting transverse colostomy in the left abdomen with decompressed in situ colon. Vascular/Lymphatic: Advanced vascular calcifications of the abdominal aorta, iliac arteries, and aortic branch vessels. Mesenteric vessels are patent. No mesenteric or portal venous gas. No aneurysm. Circumaortic left renal vein. No abdominopelvic adenopathy. Reproductive: Uterine fundal fibroid.  No adnexal mass. Other: Improved ascites from prior exam with trace free  fluid suspected in the right lower quadrant. No free air. Prior laparotomy with abdominal wall scarring. Musculoskeletal: There are no  acute or suspicious osseous abnormalities. Mild chronic superior endplate compression fracture of lower most lumbar vertebra (patient past 4 non-rib-bearing lumbar vertebra). IMPRESSION: 1. Diffusely dilated small bowel with fluid levels, suspicious for obstruction, however there is no transition point and small bowel is dilated and fluid-filled throughout to the ileostomy in the right lower quadrant. Atypical appearance of small bowel obstruction is favored given the degree of small bowel distention, however generalized ileus or enteritis are also considered. Improved ascites from prior exam. 2. Advanced aortic and branch atherosclerosis without acute vascular finding. Aortic Atherosclerosis (ICD10-I70.0). Electronically Signed   By: Keith Rake M.D.   On: 07/17/2018 21:53   Dg Abd Portable 1 View  Result Date: 07/17/2018 CLINICAL DATA:  NG tube placement EXAM: PORTABLE ABDOMEN - 1 VIEW COMPARISON:  CT 07/17/2018 FINDINGS: Esophageal tube tip overlies proximal stomach, side-port overlies the distal esophagus. Dilated loops of central small bowel consistent with obstruction. Residual contrast within the renal collecting systems. IMPRESSION: Esophageal tube tip overlies proximal stomach, side-port in the region of the distal esophagus, suggest further advancement for more optimal positioning. Electronically Signed   By: Donavan Foil M.D.   On: 07/17/2018 21:53    Review of Systems  Constitutional: Negative for chills and fever.  HENT: Negative for sore throat and tinnitus.   Eyes: Negative for blurred vision and redness.  Respiratory: Negative for cough and shortness of breath.   Cardiovascular: Negative for chest pain, palpitations, orthopnea and PND.  Gastrointestinal: Positive for abdominal pain and vomiting. Negative for diarrhea and nausea.  Genitourinary:  Negative for dysuria, frequency and urgency.  Musculoskeletal: Negative for joint pain and myalgias.  Skin: Negative for rash.       No lesions  Neurological: Negative for speech change, focal weakness and weakness.  Endo/Heme/Allergies: Does not bruise/bleed easily.       No temperature intolerance  Psychiatric/Behavioral: Negative for depression and suicidal ideas.    Blood pressure 134/86, pulse 97, temperature 97.9 F (36.6 C), temperature source Oral, resp. rate 17, height _0  (1.702 m), weight 59.4 kg, SpO2 95 %. Physical Exam  Vitals reviewed. Constitutional: She is oriented to person, place, and time. She appears well-developed and well-nourished. No distress.  HENT:  Head: Normocephalic and atraumatic.  Mouth/Throat: Oropharynx is clear and moist.  NG tube in place  Eyes: Pupils are equal, round, and reactive to light. Conjunctivae and EOM are normal. No scleral icterus.  Neck: Normal range of motion. Neck supple. No JVD present. No tracheal deviation present. No thyromegaly present.  Cardiovascular: Normal rate, regular rhythm and normal heart sounds. Exam reveals no gallop and no friction rub.  No murmur heard. Respiratory: Effort normal and breath sounds normal.  GI: Soft. Bowel sounds are normal. She exhibits no distension. There is no tenderness.  Genitourinary:  Genitourinary Comments: Deferred  Musculoskeletal: Normal range of motion. She exhibits no edema.  Lymphadenopathy:    She has no cervical adenopathy.  Neurological: She is alert and oriented to person, place, and time. No cranial nerve deficit. She exhibits normal muscle tone.  Skin: Skin is warm and dry. No rash noted. No erythema.  Psychiatric: She has a normal mood and affect. Her behavior is normal. Judgment and thought content normal.     Assessment/Plan This is a 62 year old female admitted for small bowel obstruction. 1.  SBO: Continue NG tube;  NPO for now.  Surgery to evaluate 2.  AKI: Dry due  to poor p.o. intake and recurrent vomiting.  Hydrate with  intravenous fluid.  Avoid nephrotoxic agents. 3.  CHF: Systolic; chronic.  Continue Lasix per home regimen 4.  COPD: Continue inhaled corticosteroid  5.  Hypertension: Controlled for age and expected fluctuations due to vomiting.  Continue carvedilol 6.  Depression: Continue Celexa 7.  DVT prophylaxis: Eliquis 8.  GI prophylaxis: None The patient is a DNR.  Time spent on admission orders and patient care approximately 45 minutes  Harrie Foreman, MD 07/18/2018, 3:09 AM

## 2018-07-19 ENCOUNTER — Inpatient Hospital Stay: Payer: Medicaid Other

## 2018-07-19 DIAGNOSIS — K56609 Unspecified intestinal obstruction, unspecified as to partial versus complete obstruction: Secondary | ICD-10-CM

## 2018-07-19 LAB — CBC
HCT: 24.7 % — ABNORMAL LOW (ref 35.0–47.0)
Hemoglobin: 8 g/dL — ABNORMAL LOW (ref 12.0–16.0)
MCH: 30.3 pg (ref 26.0–34.0)
MCHC: 32.4 g/dL (ref 32.0–36.0)
MCV: 93.5 fL (ref 80.0–100.0)
PLATELETS: 338 10*3/uL (ref 150–440)
RBC: 2.64 MIL/uL — ABNORMAL LOW (ref 3.80–5.20)
RDW: 14.3 % (ref 11.5–14.5)
WBC: 8.2 10*3/uL (ref 3.6–11.0)

## 2018-07-19 LAB — BASIC METABOLIC PANEL
Anion gap: 6 (ref 5–15)
BUN: 21 mg/dL (ref 8–23)
CO2: 19 mmol/L — ABNORMAL LOW (ref 22–32)
CREATININE: 1.02 mg/dL — AB (ref 0.44–1.00)
Calcium: 8.2 mg/dL — ABNORMAL LOW (ref 8.9–10.3)
Chloride: 109 mmol/L (ref 98–111)
GFR calc Af Amer: >60 mL/min (ref >60)
GFR, EST NON AFRICAN AMERICAN: 57 mL/min — AB (ref >60)
GLUCOSE: 74 mg/dL (ref 70–99)
Potassium: 3.9 mmol/L (ref 3.5–5.1)
SODIUM: 134 mmol/L — AB (ref 135–145)

## 2018-07-19 MED ORDER — ALUM & MAG HYDROXIDE-SIMETH 200-200-20 MG/5ML PO SUSP
30.0000 mL | Freq: Four times a day (QID) | ORAL | Status: DC | PRN
  Administered 2018-07-19 – 2018-07-22 (×8): 30 mL via ORAL
  Filled 2018-07-19 (×10): qty 30

## 2018-07-19 NOTE — Plan of Care (Signed)
  Problem: Coping: Goal: Level of anxiety will decrease Outcome: Progressing   Problem: Elimination: Goal: Will not experience complications related to bowel motility Outcome: Progressing   Problem: Pain Managment: Goal: General experience of comfort will improve Outcome: Progressing   Problem: Skin Integrity: Goal: Risk for impaired skin integrity will decrease Outcome: Progressing

## 2018-07-19 NOTE — Progress Notes (Signed)
Advance care planning  Purpose of Encounter Small bowel obstruction.  CODE STATUS discussion  Parties in Attendance Patient  Patients Decisional capacity Patient is alert and oriented.  Able to make medical decisions.  Documented healthcare power of attorney is her daughter Danielle [name]  Discussed regarding small bowel obstruction and conservative management at this time.  Patient tells me she feels improved with no abdominal pain today.  Is hoping this would be last admission for this condition. CODE STATUS discussed and DO NOT RESUSCITATE and DO NOT INTUBATE.  Has documentation in place.  Time spent - 17 minutes

## 2018-07-19 NOTE — Progress Notes (Signed)
CC: Ileus  Subjective: Feels a bit better, mild abd pain. Ostomy functioning w dark output HB dropped to 8.0 but creat and BUN improved  CT scan personally reviewed, no definitive complete SBO No free air or pneumatosis INR 1.5  Objective: Vital signs in last 24 hours: Temp:  [97.9 F (36.6 C)-98.6 F (37 C)] 98.6 F (37 C) (10/05 0828) Pulse Rate:  [82-91] 82 (10/05 0828) Resp:  [16-18] 16 (10/05 0828) BP: (108-132)/(69-79) 116/69 (10/05 0828) SpO2:  [96 %-99 %] 96 % (10/05 0828) Weight:  [58 kg] 58 kg (10/05 0436) Last BM Date: 07/18/18  Intake/Output from previous day: 10/04 0701 - 10/05 0700 In: 931.8 [I.V.:931.8] Out: 1750 [Urine:200; Stool:1550] Intake/Output this shift: Total I/O In: -  Out: 200 [Stool:200]  Physical exam: Debilitated in nAD Abd: soft, non tender. Ostomy patent w dark content. No peritonitis Ext: warm and well perfused  Lab Results: CBC  Recent Labs    07/17/18 2005 07/19/18 0336  WBC 12.7* 8.2  HGB 10.9* 8.0*  HCT 32.1* 24.7*  PLT 502* 338   BMET Recent Labs    07/17/18 2026 07/19/18 0336  NA 129* 134*  K 4.9 3.9  CL 99 109  CO2 18* 19*  GLUCOSE 120* 74  BUN 31* 21  CREATININE 1.68* 1.02*  CALCIUM 9.8 8.2*   PT/INR Recent Labs    07/18/18 0001  LABPROT 18.3*  INR 1.53   ABG No results for input(s): PHART, HCO3 in the last 72 hours.  Invalid input(s): PCO2, PO2  Studies/Results: Dg Abdomen 1 View  Result Date: 07/17/2018 CLINICAL DATA:  Obstruction history of SBO EXAM: ABDOMEN - 1 VIEW COMPARISON:  07/07/2018, 07/06/2018, CT 07/05/2018 FINDINGS: Lung bases are clear. No free air beneath the diaphragm. Persistent dilatation of central small bowel up to 3 cm with multiple fluid levels and absence of distal bowel gas consistent with a bowel obstruction. Clips in the pelvis. IMPRESSION: Centralized dilated small bowel loops with fluid levels consistent with small bowel obstruction. Electronically Signed   By: Donavan Foil M.D.   On: 07/17/2018 21:09   Ct Abdomen Pelvis W Contrast  Result Date: 07/17/2018 CLINICAL DATA:  Acute abdominal pain. Vomiting. Recent hospital discharge for small bowel obstruction, unable to tolerate p.o. intake without vomiting. EXAM: CT ABDOMEN AND PELVIS WITH CONTRAST TECHNIQUE: Multidetector CT imaging of the abdomen and pelvis was performed using the standard protocol following bolus administration of intravenous contrast. CONTRAST:  73mL OMNIPAQUE IOHEXOL 300 MG/ML  SOLN COMPARISON:  Abdominal radiograph earlier this day. Multiple prior CTs most recently abdominal CT 07/05/2018. PET CT 06/20/2018 FINDINGS: Lower chest: No pleural fluid or consolidation. Hepatobiliary: Unchanged subcentimeter low-density in the central liver likely cyst. Small subcentimeter subcapsular hypodensity in the right lobe is also unchanged but nonspecific. No new hepatic lesion. Gallbladder physiologically distended without wall thickening or calcified gallstone. Borderline biliary dilatation with common bile duct measuring 7 mm distally, similar to prior exam. Pancreas: Parenchymal atrophy. Spleen: No ductal dilatation or inflammation. Slightly small in size without focal abnormality. Adrenals/Urinary Tract: Normal adrenal glands. No hydronephrosis, perinephric edema, or suspicious renal mass. Small cysts in the left kidney. Urinary bladder is nondistended. Stomach/Bowel: Fluid distended distal esophagus. Fluid-filled dilated stomach. Relative decompression of the duodenum and proximal jejunum. The remaining small bowel loops are diffusely dilated, fluid-filled with fluid levels. Small bowel is dilated to the level of the right lower quadrant ileostomy, with mild bowel wall enhancement of the small bowel in the ostomy and subcutaneous tissues.  Mesenteric edema involving small bowel primarily in the right abdomen. Tiny peristomal hernia. No pneumatosis or perforation. Diverting transverse colostomy in the left  abdomen with decompressed in situ colon. Vascular/Lymphatic: Advanced vascular calcifications of the abdominal aorta, iliac arteries, and aortic branch vessels. Mesenteric vessels are patent. No mesenteric or portal venous gas. No aneurysm. Circumaortic left renal vein. No abdominopelvic adenopathy. Reproductive: Uterine fundal fibroid.  No adnexal mass. Other: Improved ascites from prior exam with trace free fluid suspected in the right lower quadrant. No free air. Prior laparotomy with abdominal wall scarring. Musculoskeletal: There are no acute or suspicious osseous abnormalities. Mild chronic superior endplate compression fracture of lower most lumbar vertebra (patient past 4 non-rib-bearing lumbar vertebra). IMPRESSION: 1. Diffusely dilated small bowel with fluid levels, suspicious for obstruction, however there is no transition point and small bowel is dilated and fluid-filled throughout to the ileostomy in the right lower quadrant. Atypical appearance of small bowel obstruction is favored given the degree of small bowel distention, however generalized ileus or enteritis are also considered. Improved ascites from prior exam. 2. Advanced aortic and branch atherosclerosis without acute vascular finding. Aortic Atherosclerosis (ICD10-I70.0). Electronically Signed   By: Keith Rake M.D.   On: 07/17/2018 21:53   Dg Abd Portable 1 View  Result Date: 07/17/2018 CLINICAL DATA:  NG tube placement EXAM: PORTABLE ABDOMEN - 1 VIEW COMPARISON:  CT 07/17/2018 FINDINGS: Esophageal tube tip overlies proximal stomach, side-port overlies the distal esophagus. Dilated loops of central small bowel consistent with obstruction. Residual contrast within the renal collecting systems. IMPRESSION: Esophageal tube tip overlies proximal stomach, side-port in the region of the distal esophagus, suggest further advancement for more optimal positioning. Electronically Signed   By: Donavan Foil M.D.   On: 07/17/2018 21:53     Anti-infectives: Anti-infectives (From admission, onward)   None      Assessment/Plan: Ileus Partial SBO continue NGT for now , check KUB today Anemia serial H/H dilutional component may need to restart w/u No need for acute surgical intervention at this time  Caroleen Hamman, MD, FACS  07/19/2018

## 2018-07-19 NOTE — Progress Notes (Signed)
[facility] [place] NAME: Danielle [name]    MR#:  [id number]  DATE OF BIRTH:  [birth year]  SUBJECTIVE:  CHIEF COMPLAINT:   Chief Complaint  Patient presents with  . Abdominal Pain  . Emesis   Feels better today.  NG tube on low intermittent suction  REVIEW OF SYSTEMS:  CONSTITUTIONAL: No fever, fatigue or weakness.  EYES: No blurred or double vision.  EARS, NOSE, AND THROAT: No tinnitus or ear pain.  RESPIRATORY: No cough, shortness of breath, wheezing or hemoptysis.  CARDIOVASCULAR: No chest pain, orthopnea, edema.  GASTROINTESTINAL: No nausea, vomiting, diarrhea or abdominal pain.  GENITOURINARY: No dysuria, hematuria.  ENDOCRINE: No polyuria, nocturia,  HEMATOLOGY: No anemia, easy bruising or bleeding SKIN: No rash or lesion. MUSCULOSKELETAL: No joint pain or arthritis.   NEUROLOGIC: No tingling, numbness, weakness.  PSYCHIATRY: No anxiety or depression.   ROS  DRUG ALLERGIES:  No Known Allergies  VITALS:  Blood pressure 116/69, pulse 82, temperature 98.6 F (37 C), temperature source Axillary, resp. rate 16, height 5\' 7"  (1.702 m), weight 58 kg, SpO2 96 %.  PHYSICAL EXAMINATION:  GENERAL:  62 y.o.-year-old patient lying in the bed with no acute distress.  EYES: Pupils equal, round, reactive to light and accommodation. No scleral icterus. Extraocular muscles intact.  HEENT: Head atraumatic, normocephalic. Oropharynx and nasopharynx clear. NG tube NECK:  Supple, no jugular venous distention. No thyroid enlargement, no tenderness.  LUNGS: Normal breath sounds bilaterally, no wheezing, rales,rhonchi or crepitation. No use of accessory muscles of respiration.  CARDIOVASCULAR: S1, S2 normal. No murmurs, rubs, or gallops.  ABDOMEN: Soft, nontender, nondistended. Bowel sounds present. No organomegaly or mass. Ileostomy bag in place. EXTREMITIES: No pedal edema, cyanosis, or clubbing.  NEUROLOGIC: Cranial nerves II through XII are  intact. Muscle strength 5/5 in all extremities. Sensation intact. Gait not checked.  PSYCHIATRIC: The patient is alert and oriented x 3.  SKIN: No obvious rash, lesion, or ulcer.   Physical Exam LABORATORY PANEL:   CBC Recent Labs  Lab 07/19/18 0336  WBC 8.2  HGB 8.0*  HCT 24.7*  PLT 338   ------------------------------------------------------------------------------------------------------------------  Chemistries  Recent Labs  Lab 07/17/18 2026 07/19/18 0336  NA 129* 134*  K 4.9 3.9  CL 99 109  CO2 18* 19*  GLUCOSE 120* 74  BUN 31* 21  CREATININE 1.68* 1.02*  CALCIUM 9.8 8.2*  AST 24  --   ALT 11  --   ALKPHOS 72  --   BILITOT 1.0  --    ------------------------------------------------------------------------------------------------------------------  Cardiac Enzymes No results for input(s): TROPONINI in the last 168 hours. ------------------------------------------------------------------------------------------------------------------  RADIOLOGY:  Dg Abdomen 1 View  Result Date: 07/17/2018 CLINICAL DATA:  Obstruction history of SBO EXAM: ABDOMEN - 1 VIEW COMPARISON:  07/07/2018, 07/06/2018, CT 07/05/2018 FINDINGS: Lung bases are clear. No free air beneath the diaphragm. Persistent dilatation of central small bowel up to 3 cm with multiple fluid levels and absence of distal bowel gas consistent with a bowel obstruction. Clips in the pelvis. IMPRESSION: Centralized dilated small bowel loops with fluid levels consistent with small bowel obstruction. Electronically Signed   By: Donavan Foil M.D.   On: 07/17/2018 21:09   Ct Abdomen Pelvis W Contrast  Result Date: 07/17/2018 CLINICAL DATA:  Acute abdominal pain. Vomiting. Recent hospital discharge for small bowel obstruction, unable to tolerate p.o. intake without vomiting. EXAM: CT ABDOMEN AND PELVIS WITH CONTRAST TECHNIQUE: Multidetector CT imaging of the abdomen and pelvis was  performed using the standard protocol  following bolus administration of intravenous contrast. CONTRAST:  31mL OMNIPAQUE IOHEXOL 300 MG/ML  SOLN COMPARISON:  Abdominal radiograph earlier this day. Multiple prior CTs most recently abdominal CT 07/05/2018. PET CT 06/20/2018 FINDINGS: Lower chest: No pleural fluid or consolidation. Hepatobiliary: Unchanged subcentimeter low-density in the central liver likely cyst. Small subcentimeter subcapsular hypodensity in the right lobe is also unchanged but nonspecific. No new hepatic lesion. Gallbladder physiologically distended without wall thickening or calcified gallstone. Borderline biliary dilatation with common bile duct measuring 7 mm distally, similar to prior exam. Pancreas: Parenchymal atrophy. Spleen: No ductal dilatation or inflammation. Slightly small in size without focal abnormality. Adrenals/Urinary Tract: Normal adrenal glands. No hydronephrosis, perinephric edema, or suspicious renal mass. Small cysts in the left kidney. Urinary bladder is nondistended. Stomach/Bowel: Fluid distended distal esophagus. Fluid-filled dilated stomach. Relative decompression of the duodenum and proximal jejunum. The remaining small bowel loops are diffusely dilated, fluid-filled with fluid levels. Small bowel is dilated to the level of the right lower quadrant ileostomy, with mild bowel wall enhancement of the small bowel in the ostomy and subcutaneous tissues. Mesenteric edema involving small bowel primarily in the right abdomen. Tiny peristomal hernia. No pneumatosis or perforation. Diverting transverse colostomy in the left abdomen with decompressed in situ colon. Vascular/Lymphatic: Advanced vascular calcifications of the abdominal aorta, iliac arteries, and aortic branch vessels. Mesenteric vessels are patent. No mesenteric or portal venous gas. No aneurysm. Circumaortic left renal vein. No abdominopelvic adenopathy. Reproductive: Uterine fundal fibroid.  No adnexal mass. Other: Improved ascites from prior exam  with trace free fluid suspected in the right lower quadrant. No free air. Prior laparotomy with abdominal wall scarring. Musculoskeletal: There are no acute or suspicious osseous abnormalities. Mild chronic superior endplate compression fracture of lower most lumbar vertebra (patient past 4 non-rib-bearing lumbar vertebra). IMPRESSION: 1. Diffusely dilated small bowel with fluid levels, suspicious for obstruction, however there is no transition point and small bowel is dilated and fluid-filled throughout to the ileostomy in the right lower quadrant. Atypical appearance of small bowel obstruction is favored given the degree of small bowel distention, however generalized ileus or enteritis are also considered. Improved ascites from prior exam. 2. Advanced aortic and branch atherosclerosis without acute vascular finding. Aortic Atherosclerosis (ICD10-I70.0). Electronically Signed   By: Keith Rake M.D.   On: 07/17/2018 21:53   Dg Abd Portable 1 View  Result Date: 07/17/2018 CLINICAL DATA:  NG tube placement EXAM: PORTABLE ABDOMEN - 1 VIEW COMPARISON:  CT 07/17/2018 FINDINGS: Esophageal tube tip overlies proximal stomach, side-port overlies the distal esophagus. Dilated loops of central small bowel consistent with obstruction. Residual contrast within the renal collecting systems. IMPRESSION: Esophageal tube tip overlies proximal stomach, side-port in the region of the distal esophagus, suggest further advancement for more optimal positioning. Electronically Signed   By: Donavan Foil M.D.   On: 07/17/2018 21:53    ASSESSMENT AND PLAN:   Active Problems:   SBO (small bowel obstruction) ([place])   This is a 62 year old female admitted for small bowel obstruction  *Small bowel obstruction.  Recurrent problem.  NG tube in place and slowly improving.  Appreciate surgery input.  *Acute kidney injury due to dehydration.  Resolved with IV fluids.  *COPD: Continue inhaled corticosteroid   *Paroxysmal  atrial fibrillation.  On Eliquis.  *Anemia of chronic disease is stable  *Chronic systolic congestive heart failure with ejection fraction 35 to 40%.  Stable   All the records are reviewed and case discussed  with Care Management/Social Workerr. Management plans discussed with the patient, family and they are in agreement.  CODE STATUS: DNR  TOTAL TIME TAKING CARE OF THIS PATIENT: 35 minutes.   POSSIBLE D/C IN 1-2 DAYS, DEPENDING ON CLINICAL CONDITION.  Neita Carp M.D on 07/19/2018   Between 7am to 6pm - Pager - [phone]  After 6pm go to [email or web address] - password [email or web address] [facility]  Office  [phone]  CC: Primary care physician; Elisabeth Cara, NP  Note: This dictation was prepared with Dragon dictation along with smaller phrase technology. Any transcriptional errors that result from this process are unintentional.

## 2018-07-20 ENCOUNTER — Inpatient Hospital Stay: Payer: Medicaid Other

## 2018-07-20 DIAGNOSIS — D649 Anemia, unspecified: Secondary | ICD-10-CM

## 2018-07-20 LAB — PROTIME-INR
INR: 1.64
PROTHROMBIN TIME: 19.3 s — AB (ref 11.4–15.2)

## 2018-07-20 LAB — CBC WITH DIFFERENTIAL/PLATELET
BASOS ABS: 0.1 10*3/uL (ref 0–0.1)
BASOS PCT: 1 %
EOS ABS: 0.3 10*3/uL (ref 0–0.7)
Eosinophils Relative: 3 %
HEMATOCRIT: 22.8 % — AB (ref 35.0–47.0)
HEMOGLOBIN: 7.2 g/dL — AB (ref 12.0–16.0)
Lymphocytes Relative: 29 %
Lymphs Abs: 2.7 10*3/uL (ref 1.0–3.6)
MCH: 29.9 pg (ref 26.0–34.0)
MCHC: 31.8 g/dL — AB (ref 32.0–36.0)
MCV: 94.1 fL (ref 80.0–100.0)
MONOS PCT: 8 %
Monocytes Absolute: 0.8 10*3/uL (ref 0.2–0.9)
NEUTROS ABS: 5.4 10*3/uL (ref 1.4–6.5)
NEUTROS PCT: 59 %
Platelets: 327 10*3/uL (ref 150–440)
RBC: 2.42 MIL/uL — AB (ref 3.80–5.20)
RDW: 14.1 % (ref 11.5–14.5)
WBC: 9.2 10*3/uL (ref 3.6–11.0)

## 2018-07-20 LAB — COMPREHENSIVE METABOLIC PANEL
ALT: 8 U/L (ref 0–44)
AST: 15 U/L (ref 15–41)
Albumin: 2.8 g/dL — ABNORMAL LOW (ref 3.5–5.0)
Alkaline Phosphatase: 42 U/L (ref 38–126)
Anion gap: 7 (ref 5–15)
BILIRUBIN TOTAL: 0.9 mg/dL (ref 0.3–1.2)
BUN: 11 mg/dL (ref 8–23)
CHLORIDE: 110 mmol/L (ref 98–111)
CO2: 18 mmol/L — ABNORMAL LOW (ref 22–32)
CREATININE: 0.8 mg/dL (ref 0.44–1.00)
Calcium: 8 mg/dL — ABNORMAL LOW (ref 8.9–10.3)
GFR calc non Af Amer: >60 mL/min (ref >60)
Glucose, Bld: 46 mg/dL — ABNORMAL LOW (ref 70–99)
POTASSIUM: 3.6 mmol/L (ref 3.5–5.1)
SODIUM: 135 mmol/L (ref 135–145)
Total Protein: 5.3 g/dL — ABNORMAL LOW (ref 6.5–8.1)

## 2018-07-20 LAB — APTT: APTT: 46 s — AB (ref 24–36)

## 2018-07-20 NOTE — Plan of Care (Signed)
  Problem: Activity: Goal: Risk for activity intolerance will decrease Outcome: Progressing   Problem: Coping: Goal: Level of anxiety will decrease Outcome: Progressing   Problem: Pain Managment: Goal: General experience of comfort will improve Outcome: Progressing   Problem: Skin Integrity: Goal: Risk for impaired skin integrity will decrease Outcome: Progressing

## 2018-07-20 NOTE — Progress Notes (Signed)
Patient does not want to be a DNR. She has never received any paperwork or gone through the process of obtaining a DNR yet it is documented.  She wished the doctor to remove the DNR status.

## 2018-07-20 NOTE — Progress Notes (Signed)
[facility] [place] NAME: Danielle [name]    MR#:  [id number]  DATE OF BIRTH:  Jul 24, 1956  SUBJECTIVE:  CHIEF COMPLAINT:   Chief Complaint  Patient presents with  . Abdominal Pain  . Emesis   Discomfort with NGT. No abd pain afebrile  REVIEW OF SYSTEMS:  CONSTITUTIONAL: No fever, fatigue or weakness.  EYES: No blurred or double vision.  EARS, NOSE, AND THROAT: No tinnitus or ear pain.  RESPIRATORY: No cough, shortness of breath, wheezing or hemoptysis.  CARDIOVASCULAR: No chest pain, orthopnea, edema.  GASTROINTESTINAL: No nausea, vomiting, diarrhea or abdominal pain.  GENITOURINARY: No dysuria, hematuria.  ENDOCRINE: No polyuria, nocturia,  HEMATOLOGY: No anemia, easy bruising or bleeding SKIN: No rash or lesion. MUSCULOSKELETAL: No joint pain or arthritis.   NEUROLOGIC: No tingling, numbness, weakness.  PSYCHIATRY: No anxiety or depression.   ROS  DRUG ALLERGIES:  No Known Allergies  VITALS:  Blood pressure 134/70, pulse 86, temperature 98.5 F (36.9 C), temperature source Oral, resp. rate 18, height 5\' 7"  (1.702 m), weight 58.7 kg, SpO2 98 %.  PHYSICAL EXAMINATION:  GENERAL:  62 y.o.-year-old patient lying in the bed with no acute distress.  EYES: Pupils equal, round, reactive to light and accommodation. No scleral icterus. Extraocular muscles intact.  HEENT: Head atraumatic, normocephalic. Oropharynx and nasopharynx clear. NG tube NECK:  Supple, no jugular venous distention. No thyroid enlargement, no tenderness.  LUNGS: Normal breath sounds bilaterally, no wheezing, rales,rhonchi or crepitation. No use of accessory muscles of respiration.  CARDIOVASCULAR: S1, S2 normal. No murmurs, rubs, or gallops.  ABDOMEN: Soft, nontender, nondistended. Bowel sounds present. No organomegaly or mass. Ileostomy bag in place. EXTREMITIES: No pedal edema, cyanosis, or clubbing.  NEUROLOGIC: Cranial nerves II through XII are intact. Muscle  strength 5/5 in all extremities. Sensation intact. Gait not checked.  PSYCHIATRIC: The patient is alert and oriented x 3.  SKIN: No obvious rash, lesion, or ulcer.   Physical Exam LABORATORY PANEL:   CBC Recent Labs  Lab 07/20/18 0327  WBC 9.2  HGB 7.2*  HCT 22.8*  PLT 327   ------------------------------------------------------------------------------------------------------------------  Chemistries  Recent Labs  Lab 07/20/18 0327  NA 135  K 3.6  CL 110  CO2 18*  GLUCOSE 46*  BUN 11  CREATININE 0.80  CALCIUM 8.0*  AST 15  ALT 8  ALKPHOS 42  BILITOT 0.9   ------------------------------------------------------------------------------------------------------------------  Cardiac Enzymes No results for input(s): TROPONINI in the last 168 hours. ------------------------------------------------------------------------------------------------------------------  RADIOLOGY:  Dg Abd Portable 2v  Result Date: 07/20/2018 CLINICAL DATA:  Small bowel obstruction EXAM: PORTABLE ABDOMEN - 2 VIEW COMPARISON:  07/19/2018 FINDINGS: NG tube tip is in the proximal stomach with the side port in the distal esophagus. Mildly prominent small bowel loops in the mid abdomen are similar to prior study. No organomegaly or free air. IMPRESSION: NG tube tip remains in the proximal stomach with the side port in the distal esophagus. Mildly prominent small bowel loops, unchanged. Electronically Signed   By: Rolm Baptise M.D.   On: 07/20/2018 07:25   Dg Abd Portable 2v  Result Date: 07/19/2018 CLINICAL DATA:  Small bowel obstruction EXAM: PORTABLE ABDOMEN - 2 VIEW COMPARISON:  07/17/2018 and previous FINDINGS: Nasogastric tube remains with its tip just beyond the GE junction. The stomach is nondistended. A few gas distended small bowel loops in the mid abdomen, with perhaps slightly decreased distension since previous exam. Colon is nondilated. Moderate aortoiliac arterial calcifications. Bilateral  tubal  ligation clips. Regional bones unremarkable. Visualized lung bases clear. IMPRESSION: 1. Is gastric tube just across the GE junction. 2. Slight decrease in degree of distention of mid abdominal small bowel loops. Electronically Signed   By: Lucrezia Europe M.D.   On: 07/19/2018 14:15    ASSESSMENT AND PLAN:   Active Problems:   SBO (small bowel obstruction) ([place])   This is a 62 year old female admitted for small bowel obstruction  *Small bowel obstruction.  Recurrent problem.  NG tube in place and slowly improving.  Appreciate surgery input.  *Acute kidney injury due to dehydration.  Resolved with IV fluids.  *COPD: Continue inhaled corticosteroid   *Paroxysmal atrial fibrillation.  On Eliquis.  *Anemia of chronic disease Monitor Transfuse to keep >7  *Chronic systolic congestive heart failure with ejection fraction 35 to 40%.  Stable  All the records are reviewed and case discussed with Care Management/Social Workerr. Management plans discussed with the patient, family and they are in agreement.  CODE STATUS: DNR  TOTAL TIME TAKING CARE OF THIS PATIENT: 35 minutes.   POSSIBLE D/C IN 1-2 DAYS, DEPENDING ON CLINICAL CONDITION.  Neita Carp M.D on 07/20/2018   Between 7am to 6pm - Pager - [phone]  After 6pm go to [email or web address] - password [email or web address] [facility]  Office  [phone]  CC: Primary care physician; Elisabeth Cara, NP  Note: This dictation was prepared with Dragon dictation along with smaller phrase technology. Any transcriptional errors that result from this process are unintentional.

## 2018-07-20 NOTE — Consult Note (Signed)
Jonathon Bellows , MD [address] [place], [place], [place], [address] [address], [place], [place], [place], [address] Phone: [phone]  Fax: [phone]  Consultation  Referring Provider:Dr Sudini  Primary Care Physician:  Elisabeth Cara, NP Primary Gastroenterologist:  Dr. Tiffany Kocher          Reason for Consultation:     Anemia   Date of Admission:  07/17/2018 Date of Consultation:  07/20/2018         HPI:   Danielle [name] is a 62 y.o. female is a patient who has previously been seen by Dr. Bonna Gains as well as Dr. Allen Norris.  She was admitted on 07/18/2018 with small bowel obstruction.  AKI.  Dr. Peyton Najjar performed an ex lap and end ileostomy about 3 months back for bleeding of the hepatic flexure.  Subsequently in 07/04/2018 she was admitted with small bowel obstruction.  In July 2019 she had a CT scan of the abdomen which showed thickening in the ascending colon.  Colonoscopy showed large circumferential ulceration of the hepatic flexure.  He subsequently had a right hemicolectomy.  Culture report showed no evidence of malignancy and it appeared ischemic.  She had an upper endoscopy on 05/07/2018 which was performed for hematochezia and the EGD showed superficial esophageal tear.  Today have been consulted for a drop in hemoglobin.  11 days back her hemoglobin is 8 g with an MCV of 93 3 days back it was 10.9 g with an MCV of 91 which further dropped to 8 g and then to 7.2 g.  INR is 1.64.  CMP shows a CO2 of 18 with a glucose of 46 and a creatinine of 0.8 at 3:00 this morning.  She says her stool has been black for a while, admitted with SBO , has NG tube in place for drainage and no black material seen .   Past Medical History:  Diagnosis Date  . Anxiety   . Arthritis   . Cancer ([place]) left    breast cancer 2000, chemo tx's with total mastectomy and lymph nodes resected.   . Cancer of right lung ([place]) 02/21/2016   rad tx's.   . CHF (congestive heart failure) ([place])   . COPD (chronic  obstructive pulmonary disease) ([place])   . Dependence on supplemental oxygen   . Depression   . Diabetes mellitus without complication ([place])   . Heart murmur   . Hypertension   . Lung nodule   . Lymphedema   . Shortness of breath dyspnea    with exertion  . Status post chemotherapy 2001   left breast cancer  . Status post radiation therapy 2001   left breast cancer    Past Surgical History:  Procedure Laterality Date  . Breast Biospy Left    [facility]  . BREAST SURGERY    . COLONOSCOPY N/A 04/30/2018   Procedure: COLONOSCOPY;  Surgeon: Virgel Manifold, MD;  Location: [facility] ENDOSCOPY;  Service: Endoscopy;  Laterality: N/A;  . DILATION AND CURETTAGE OF UTERUS    . ELECTROMAGNETIC NAVIGATION BROCHOSCOPY Right 04/11/2016   Procedure: ELECTROMAGNETIC NAVIGATION BRONCHOSCOPY;  Surgeon: Vilinda Boehringer, MD;  Location: [facility] ORS;  Service: Cardiopulmonary;  Laterality: Right;  . ESOPHAGOGASTRODUODENOSCOPY (EGD) WITH PROPOFOL N/A 05/07/2018   Procedure: ESOPHAGOGASTRODUODENOSCOPY (EGD) WITH PROPOFOL;  Surgeon: Lucilla Lame, MD;  Location: [facility] ENDOSCOPY;  Service: Endoscopy;  Laterality: N/A;  . history of colonoscopy]    . ILEOSTOMY    . LAPAROTOMY Right 05/04/2018   Procedure: EXPLORATORY LAPAROTOMY right  colectomy right and left ostomy;  Surgeon: Herbert Pun, MD;  Location: [facility] ORS;  Service: General;  Laterality: Right;  . LUNG BIOPSY    . MASTECTOMY Left    2000, [facility]  . ROTATOR CUFF REPAIR Right    [facility]    Prior to Admission medications   Medication Sig Start Date End Date Taking? Authorizing Provider  albuterol (PROVENTIL HFA;VENTOLIN HFA) 108 (90 Base) MCG/ACT inhaler Inhale 2 puffs into the lungs every 6 (six) hours as needed for wheezing or shortness of breath. 04/24/18  Yes Demetrios Loll, MD  apixaban (ELIQUIS) 5 MG TABS tablet Take 1 tablet (5 mg total) by mouth 2 (two) times daily. 06/23/18  Yes [name], MD  budesonide-formoterol (SYMBICORT) 160-4.5 MCG/ACT  inhaler Inhale 2 puffs into the lungs 2 (two) times daily. 04/24/18  Yes Demetrios Loll, MD  carvedilol (COREG) 6.25 MG tablet Take 1 tablet (6.25 mg total) by mouth 2 (two) times daily. 06/24/18  Yes Hackney, Tina A, FNP  citalopram (CELEXA) 40 MG tablet Take 40 mg by mouth daily.   Yes [provider]  ferrous sulfate 325 (65 FE) MG tablet Take 1 tablet (325 mg total) by mouth 2 (two) times daily with a meal. 07/02/18  Yes [name], MD  furosemide (LASIX) 20 MG tablet Take 20 mg by mouth daily.    Yes [provider]  HYDROcodone-acetaminophen (NORCO/VICODIN) 5-325 MG tablet Take 1 tablet by mouth every 6 (six) hours as needed for moderate pain or severe pain.   Yes [provider]  megestrol (MEGACE) 400 MG/10ML suspension Take 7.5 mLs (300 mg total) by mouth 2 (two) times daily. 06/05/18  Yes Fritzi Mandes, MD  Multiple Vitamin (MULTIVITAMIN WITH MINERALS) TABS tablet Take 1 tablet by mouth daily. 06/06/18  Yes Fritzi Mandes, MD    Family History  Problem Relation Age of Onset  . Breast cancer Mother 84  . Cancer Mother        Breast   . Cirrhosis Father   . Breast cancer Paternal Aunt 73  . Cancer Maternal Aunt        Breast      Social History   Tobacco Use  . Smoking status: Former Smoker    Packs/day: 0.50    Years: 20.00    Pack years: 10.00    Types: Cigarettes    Last attempt to quit: 07/02/2012    Years since quitting: 6.0  . Smokeless tobacco: Never Used  . Tobacco comment: quit 2014  Substance Use Topics  . Alcohol use: Yes    Comment: Occasionally  . Drug use: No    Allergies as of 07/17/2018  . (No Known Allergies)    Review of Systems:    All systems reviewed and negative except where noted in HPI.   Physical Exam:  Vital signs in last 24 hours: Temp:  [98.2 F (36.8 C)-98.8 F (37.1 C)] 98.5 F (36.9 C) (10/06 0751) Pulse Rate:  [86-96] 86 (10/06 0751) Resp:  [16-18] 18 (10/06 0751) BP: (117-143)/(70-79) 134/70 (10/06  0751) SpO2:  [93 %-98 %] 98 % (10/06 0751) Weight:  [58.7 kg] 58.7 kg (10/06 0454) Last BM Date: 07/20/18 General:   Pleasant, cooperative in NAD Head:  Normocephalic and atraumatic. Eyes:   No icterus.   Conjunctiva pink. PERRLA. Ears:  Normal auditory acuity. Neck:  Supple; no masses or thyroidomegaly Lungs: Respirations even and unlabored. Lungs clear to auscultation bilaterally.   No wheezes, crackles, or rhonchi.  Heart:  Regular rate and rhythm;  Without murmur, clicks, rubs or gallops Abdomen:  Soft, nondistended, nontender.Ileostomy seen , dark green black material seen . No rebound or guarding.  Neurologic:  Alert and oriented x3;  grossly normal neurologically. Skin:  Intact without significant lesions or rashes. Cervical Nodes:  No significant cervical adenopathy. Psych:  Alert and cooperative. Normal affect.  LAB RESULTS: Recent Labs    07/17/18 2005 07/19/18 0336 07/20/18 0327  WBC 12.7* 8.2 9.2  HGB 10.9* 8.0* 7.2*  HCT 32.1* 24.7* 22.8*  PLT 502* 338 327   BMET Recent Labs    07/17/18 2026 07/19/18 0336 07/20/18 0327  NA 129* 134* 135  K 4.9 3.9 3.6  CL 99 109 110  CO2 18* 19* 18*  GLUCOSE 120* 74 46*  BUN 31* 21 11  CREATININE 1.68* 1.02* 0.80  CALCIUM 9.8 8.2* 8.0*   LFT Recent Labs    07/20/18 0327  PROT 5.3*  ALBUMIN 2.8*  AST 15  ALT 8  ALKPHOS 42  BILITOT 0.9   PT/INR Recent Labs    07/18/18 0001 07/20/18 0327  LABPROT 18.3* 19.3*  INR 1.53 1.64    STUDIES: Dg Abd Portable 2v  Result Date: 07/20/2018 CLINICAL DATA:  Small bowel obstruction EXAM: PORTABLE ABDOMEN - 2 VIEW COMPARISON:  07/19/2018 FINDINGS: NG tube tip is in the proximal stomach with the side port in the distal esophagus. Mildly prominent small bowel loops in the mid abdomen are similar to prior study. No organomegaly or free air. IMPRESSION: NG tube tip remains in the proximal stomach with the side port in the distal esophagus. Mildly prominent small bowel loops,  unchanged. Electronically Signed   By: Rolm Baptise M.D.   On: 07/20/2018 07:25   Dg Abd Portable 2v  Result Date: 07/19/2018 CLINICAL DATA:  Small bowel obstruction EXAM: PORTABLE ABDOMEN - 2 VIEW COMPARISON:  07/17/2018 and previous FINDINGS: Nasogastric tube remains with its tip just beyond the GE junction. The stomach is nondistended. A few gas distended small bowel loops in the mid abdomen, with perhaps slightly decreased distension since previous exam. Colon is nondilated. Moderate aortoiliac arterial calcifications. Bilateral tubal ligation clips. Regional bones unremarkable. Visualized lung bases clear. IMPRESSION: 1. Is gastric tube just across the GE junction. 2. Slight decrease in degree of distention of mid abdominal small bowel loops. Electronically Signed   By: Lucrezia Europe M.D.   On: 07/19/2018 14:15      Impression / Plan:   Danielle [name] is a 62 y.o. y/o female with a history of recent right hemicolectomy for an ulceration seen on a colonoscopy in the hepatic flexure.  Pathology report after surgery demonstrated features of ischemic colitis and no malignancy.  Post operatively for the last 2 to 3 months she has had issues with small bowel obstruction which has resolved with conservative treatment.  She has had an upper endoscopy which showed an esophageal tear a few months back she has not had a colonoscopy after her surgery.  The reason I am consulted today is for gradual drop in hemoglobin.  She does have dark black green material in her ileostomy bag, could be from iron but also could be from blood. Her NG is not retruning any blood or black material. Less likely bleeding from the stomach . Other sites are small bowel .   Plan  1. Once SBO resolves may need to consider EGD+capsule study of small bowel ( may need prior patency capsule trial), +/- evaluate left colon .  2. Monitor CBC   Thank you for involving me in the care of this patient.      LOS: 2 days   Jonathon Bellows, MD   07/20/2018, 2:39 PM

## 2018-07-20 NOTE — Progress Notes (Signed)
CC: ileus  Subjective: Hb continued to drop 7.2 Good ileostomy output No pain  Objective: Vital signs in last 24 hours: Temp:  [98.2 F (36.8 C)-98.8 F (37.1 C)] 98.5 F (36.9 C) (10/06 0751) Pulse Rate:  [86-96] 86 (10/06 0751) Resp:  [16-18] 18 (10/06 0751) BP: (117-143)/(70-79) 134/70 (10/06 0751) SpO2:  [93 %-98 %] 98 % (10/06 0751) Weight:  [58.7 kg] 58.7 kg (10/06 0454) Last BM Date: 07/20/18  Intake/Output from previous day: 10/05 0701 - 10/06 0700 In: 1189.2 [I.V.:1189.2] Out: 900 [Urine:400; Stool:500] Intake/Output this shift: No intake/output data recorded.  Physical exam:  NAD, debilitated Abd: soft, NT, ileostomy working now for bile output. Ext: no edema and well perfused KUB personally reviewed, no free air, dilated loops sb Lab Results: CBC  Recent Labs    07/19/18 0336 07/20/18 0327  WBC 8.2 9.2  HGB 8.0* 7.2*  HCT 24.7* 22.8*  PLT 338 327   BMET Recent Labs    07/19/18 0336 07/20/18 0327  NA 134* 135  K 3.9 3.6  CL 109 110  CO2 19* 18*  GLUCOSE 74 46*  BUN 21 11  CREATININE 1.02* 0.80  CALCIUM 8.2* 8.0*   PT/INR Recent Labs    07/18/18 0001 07/20/18 0327  LABPROT 18.3* 19.3*  INR 1.53 1.64   ABG No results for input(s): PHART, HCO3 in the last 72 hours.  Invalid input(s): PCO2, PO2  Studies/Results: Dg Abd Portable 2v  Result Date: 07/20/2018 CLINICAL DATA:  Small bowel obstruction EXAM: PORTABLE ABDOMEN - 2 VIEW COMPARISON:  07/19/2018 FINDINGS: NG tube tip is in the proximal stomach with the side port in the distal esophagus. Mildly prominent small bowel loops in the mid abdomen are similar to prior study. No organomegaly or free air. IMPRESSION: NG tube tip remains in the proximal stomach with the side port in the distal esophagus. Mildly prominent small bowel loops, unchanged. Electronically Signed   By: Rolm Baptise M.D.   On: 07/20/2018 07:25   Dg Abd Portable 2v  Result Date: 07/19/2018 CLINICAL DATA:  Small bowel  obstruction EXAM: PORTABLE ABDOMEN - 2 VIEW COMPARISON:  07/17/2018 and previous FINDINGS: Nasogastric tube remains with its tip just beyond the GE junction. The stomach is nondistended. A few gas distended small bowel loops in the mid abdomen, with perhaps slightly decreased distension since previous exam. Colon is nondilated. Moderate aortoiliac arterial calcifications. Bilateral tubal ligation clips. Regional bones unremarkable. Visualized lung bases clear. IMPRESSION: 1. Is gastric tube just across the GE junction. 2. Slight decrease in degree of distention of mid abdominal small bowel loops. Electronically Signed   By: Lucrezia Europe M.D.   On: 07/19/2018 14:15    Anti-infectives: Anti-infectives (From admission, onward)   None      Assessment/Plan:  Persistent and worsening anemia. We will have to restart again. GI consult requested. Bleeding scan. May benefit from CT enterography. I do not think the ileus is her problem but the occult GI bleed is. No need for surgical intervention at this time We will keep NGT until we try to solve some of her GI issues  Caroleen Hamman, MD, [facility]  07/20/2018

## 2018-07-21 ENCOUNTER — Ambulatory Visit: Payer: Self-pay

## 2018-07-21 LAB — CBC WITH DIFFERENTIAL/PLATELET
BASOS PCT: 1 %
Basophils Absolute: 0.1 10*3/uL (ref 0–0.1)
Eosinophils Absolute: 0.3 10*3/uL (ref 0–0.7)
Eosinophils Relative: 3 %
HEMATOCRIT: 23.4 % — AB (ref 35.0–47.0)
HEMOGLOBIN: 7.4 g/dL — AB (ref 12.0–16.0)
LYMPHS ABS: 2.2 10*3/uL (ref 1.0–3.6)
LYMPHS PCT: 29 %
MCH: 30.2 pg (ref 26.0–34.0)
MCHC: 31.7 g/dL — AB (ref 32.0–36.0)
MCV: 95.2 fL (ref 80.0–100.0)
Monocytes Absolute: 0.7 10*3/uL (ref 0.2–0.9)
Monocytes Relative: 9 %
NEUTROS ABS: 4.5 10*3/uL (ref 1.4–6.5)
NEUTROS PCT: 58 %
Platelets: 327 10*3/uL (ref 150–440)
RBC: 2.46 MIL/uL — ABNORMAL LOW (ref 3.80–5.20)
RDW: 14 % (ref 11.5–14.5)
WBC: 7.8 10*3/uL (ref 3.6–11.0)

## 2018-07-21 MED ORDER — PANTOPRAZOLE SODIUM 40 MG IV SOLR
40.0000 mg | Freq: Two times a day (BID) | INTRAVENOUS | Status: DC
  Administered 2018-07-21 – 2018-07-23 (×5): 40 mg via INTRAVENOUS
  Filled 2018-07-21 (×5): qty 40

## 2018-07-21 NOTE — Progress Notes (Signed)
Spoke with Dr Marcille Blanco concerning DNR Code Status. Pt stated she does not want DNR status. Received order to change pt to FULL CODE STATUS.

## 2018-07-21 NOTE — Progress Notes (Signed)
Subjective:  CC:  Danielle [name] is a 62 y.o. female  Hospital stay day 3,   SBO, ileus  HPI: Complaining of some heartburn.  Otherwise ostomy has output, gas.  ROS:  A 5 point review of systems was performed and pertinent positives and negatives noted in HPI.   Objective:      Temp:  [98.1 F (36.7 C)-98.7 F (37.1 C)] 98.7 F (37.1 C) (10/07 0750) Pulse Rate:  [79-95] 95 (10/07 0750) Resp:  [16-20] 18 (10/07 0750) BP: (135-145)/(79-85) 140/85 (10/07 0750) SpO2:  [94 %-96 %] 96 % (10/07 0750) Weight:  [58.8 kg] 58.8 kg (10/07 0500)     Height: 5\' 7"  (170.2 cm) Weight: 58.8 kg BMI (Calculated): 20.3   Intake/Output this shift:  Total I/O In: -  Out: 100 [Stool:100]       Constitutional :  alert, cooperative, appears stated age and no distress  Respiratory:  clear to auscultation bilaterally  Cardiovascular:  regularly irregular rhythm  Gastrointestinal: soft, non-tender; bowel sounds normal; no masses,  no organomegaly and ostomy with same dark, green output, gas in bag. nothing in mucous fistula.  midline wound clean.   Skin: Cool and moist.   Psychiatric: Normal affect, non-agitated, not confused       LABS:  CMP Latest Ref Rng & Units 07/20/2018 07/19/2018 07/17/2018  Glucose 70 - 99 mg/dL 46(L) 74 120(H)  BUN 8 - 23 mg/dL 11 21 31(H)  Creatinine 0.44 - 1.00 mg/dL 0.80 1.02(H) 1.68(H)  Sodium 135 - 145 mmol/L 135 134(L) 129(L)  Potassium 3.5 - 5.1 mmol/L 3.6 3.9 4.9  Chloride 98 - 111 mmol/L 110 109 99  CO2 22 - 32 mmol/L 18(L) 19(L) 18(L)  Calcium 8.9 - 10.3 mg/dL 8.0(L) 8.2(L) 9.8  Total Protein 6.5 - 8.1 g/dL 5.3(L) - 8.5(H)  Total Bilirubin 0.3 - 1.2 mg/dL 0.9 - 1.0  Alkaline Phos 38 - 126 U/L 42 - 72  AST 15 - 41 U/L 15 - 24  ALT 0 - 44 U/L 8 - 11   CBC Latest Ref Rng & Units 07/21/2018 07/20/2018 07/19/2018  WBC 3.6 - 11.0 K/uL 7.8 9.2 8.2  Hemoglobin 12.0 - 16.0 g/dL 7.4(L) 7.2(L) 8.0(L)  Hematocrit 35.0 - 47.0 % 23.4(L) 22.8(L) 24.7(L)  Platelets  150 - 440 K/uL 327 327 338    RADS: n/a Assessment:   Recurrent SBO, GI bleed.  Will clamp NG and see if she tolerates.  Will appreciate GI input/recs for possible formal motility studies, to ensure all her episodes is not from atypical ileus vs a mechanical small bowel obstruction.  She is a poor candidate for surgery and family members are hesitant on pursuing any surgical interventions for her recurrent symptoms, but at the same time, patient cannot continue to be admitted every couple weeks for NG tube decompression.  Will continue to monitor closely.

## 2018-07-21 NOTE — Progress Notes (Signed)
Pt's NGT was connected back to suction around 1230- and from 1230-1430 there was no output. NGT clamped again and clear liquid diet started per Dr. Lysle Pearl order. Pt tolerated clears well so NGT removed around 1800 with no difficulties.   [place], Jerry Caras

## 2018-07-21 NOTE — Progress Notes (Signed)
[place] at [place] NAME: Danielle [name]    MR#:  [id number]  DATE OF BIRTH:  01-23-1956  SUBJECTIVE:   Complains of heartburn/mid substernal burning sensation. NG tube clamped has ileostomy an colostomy output REVIEW OF SYSTEMS:   Review of Systems  Constitutional: Negative for chills, fever and weight loss.  HENT: Negative for ear discharge, ear pain and nosebleeds.   Eyes: Negative for blurred vision, pain and discharge.  Respiratory: Negative for sputum production, shortness of breath, wheezing and stridor.   Cardiovascular: Negative for chest pain, palpitations, orthopnea and PND.  Gastrointestinal: Negative for abdominal pain, diarrhea, nausea and vomiting.  Genitourinary: Negative for frequency and urgency.  Musculoskeletal: Negative for back pain and joint pain.  Neurological: Negative for sensory change, speech change, focal weakness and weakness.  Psychiatric/Behavioral: Negative for depression and hallucinations. The patient is not nervous/anxious.    Tolerating Diet:npo Tolerating PT: ambulatory  DRUG ALLERGIES:  No Known Allergies  VITALS:  Blood pressure 140/85, pulse 95, temperature 98.7 F (37.1 C), temperature source Oral, resp. rate 18, height 5\' 7"  (1.702 m), weight 58.8 kg, SpO2 96 %.  PHYSICAL EXAMINATION:   Physical Exam  GENERAL:  62 y.o.-year-old patient lying in the bed with no acute distress.  EYES: Pupils equal, round, reactive to light and accommodation. No scleral icterus. Extraocular muscles intact.  HEENT: Head atraumatic, normocephalic. Oropharynx and nasopharynx clear.  NECK:  Supple, no jugular venous distention. No thyroid enlargement, no tenderness.  LUNGS: Normal breath sounds bilaterally, no wheezing, rales, rhonchi. No use of accessory muscles of respiration.  CARDIOVASCULAR: S1, S2 normal. No murmurs, rubs, or gallops.  ABDOMEN: Soft, nontender, nondistended. Bowel sounds present. No  organomegaly or mass.colostomy and enterostomy + EXTREMITIES: No cyanosis, clubbing or edema b/l.    NEUROLOGIC: Cranial nerves II through XII are intact. No focal Motor or sensory deficits b/l.   PSYCHIATRIC:  patient is alert and oriented x 3.  SKIN: No obvious rash, lesion, or ulcer.   LABORATORY PANEL:  CBC Recent Labs  Lab 07/21/18 0303  WBC 7.8  HGB 7.4*  HCT 23.4*  PLT 327    Chemistries  Recent Labs  Lab 07/20/18 0327  NA 135  K 3.6  CL 110  CO2 18*  GLUCOSE 46*  BUN 11  CREATININE 0.80  CALCIUM 8.0*  AST 15  ALT 8  ALKPHOS 42  BILITOT 0.9   Cardiac Enzymes No results for input(s): TROPONINI in the last 168 hours. RADIOLOGY:  Dg Abd Portable 2v  Result Date: 07/20/2018 CLINICAL DATA:  Small bowel obstruction EXAM: PORTABLE ABDOMEN - 2 VIEW COMPARISON:  07/19/2018 FINDINGS: NG tube tip is in the proximal stomach with the side port in the distal esophagus. Mildly prominent small bowel loops in the mid abdomen are similar to prior study. No organomegaly or free air. IMPRESSION: NG tube tip remains in the proximal stomach with the side port in the distal esophagus. Mildly prominent small bowel loops, unchanged. Electronically Signed   By: Rolm Baptise M.D.   On: 07/20/2018 07:25   Dg Abd Portable 2v  Result Date: 07/19/2018 CLINICAL DATA:  Small bowel obstruction EXAM: PORTABLE ABDOMEN - 2 VIEW COMPARISON:  07/17/2018 and previous FINDINGS: Nasogastric tube remains with its tip just beyond the GE junction. The stomach is nondistended. A few gas distended small bowel loops in the mid abdomen, with perhaps slightly decreased distension since previous exam. Colon is nondilated. Moderate aortoiliac arterial calcifications. Bilateral tubal  ligation clips. Regional bones unremarkable. Visualized lung bases clear. IMPRESSION: 1. Is gastric tube just across the GE junction. 2. Slight decrease in degree of distention of mid abdominal small bowel loops. Electronically Signed    By: Lucrezia Europe M.D.   On: 07/19/2018 14:15   ASSESSMENT AND PLAN:   62 year old female admitted for small bowel obstruction  *Small bowel obstruction.  Recurrent problem.  NG tube in place and slowly improving.  Appreciate surgery input. NG tube clamped today. Will monitor output. Continue IV fluids at 85 an hour. -IV Protonix BID for heartburn -G.I. consultation appreciated. Patient is now off eliquis. Possible EGD/small capsule endoscopy -d/w dr Lysle Pearl  *Acute kidney injury due to dehydration.  Resolved with IV fluids. Decrease to 85 cc/hr with h/o chf  *COPD: Continue inhaled corticosteroid   *Paroxysmal atrial fibrillation.  holding Eliquis.  *Anemia of chronic disease Monitor Transfuse to keep >7  *Chronic systolic congestive heart failure with ejection fraction 35 to 40%.  Stable  Case discussed with Care Management/Social Worker. Management plans discussed with the patient, family and they are in agreement.  CODE STATUS: full  DVT Prophylaxis: Scd  TOTAL TIME TAKING CARE OF THIS PATIENT: *30* minutes.  >50% time spent on counselling and coordination of care  POSSIBLE D/C IN few DAYS, DEPENDING ON CLINICAL CONDITION.  Note: This dictation was prepared with Dragon dictation along with smaller phrase technology. Any transcriptional errors that result from this process are unintentional.  Fritzi Mandes M.D on 07/21/2018 at 9:05 AM  Between 7am to 6pm - Pager - [phone]  After 6pm go to [email or web address] - password [email or web address] [facility]  Office  [phone]  CC: Primary care physician; Elisabeth Cara, NPPatient ID: Danielle [name], female   DOB: [birth year], 62 y.o.   MRN: [id number]

## 2018-07-22 ENCOUNTER — Inpatient Hospital Stay: Payer: Medicaid Other | Admitting: Anesthesiology

## 2018-07-22 ENCOUNTER — Encounter
Admission: EM | Disposition: A | Discharge status: Home / Self Care | Payer: Self-pay | Source: Home / Self Care | Attending: Internal Medicine

## 2018-07-22 DIAGNOSIS — K2100 Gastro-esophageal reflux disease with esophagitis, without bleeding: Secondary | ICD-10-CM

## 2018-07-22 DIAGNOSIS — D500 Iron deficiency anemia secondary to blood loss (chronic): Secondary | ICD-10-CM

## 2018-07-22 HISTORY — PX: COLONOSCOPY: SHX5424

## 2018-07-22 HISTORY — PX: ILEOSCOPY: SHX5434

## 2018-07-22 HISTORY — PX: ESOPHAGOGASTRODUODENOSCOPY: SHX5428

## 2018-07-22 SURGERY — EGD (ESOPHAGOGASTRODUODENOSCOPY)
Anesthesia: General

## 2018-07-22 MED ORDER — HYDROMORPHONE HCL 1 MG/ML IJ SOLN: 0.5000 mg | INTRAMUSCULAR | Status: DC | PRN

## 2018-07-22 MED ORDER — ONDANSETRON HCL 4 MG/2ML IJ SOLN
INTRAMUSCULAR | Status: AC
  Filled 2018-07-22: qty 2

## 2018-07-22 MED ORDER — DEXAMETHASONE SODIUM PHOSPHATE 10 MG/ML IJ SOLN
INTRAMUSCULAR | Status: DC | PRN
  Administered 2018-07-22: 5 mg via INTRAVENOUS

## 2018-07-22 MED ORDER — ONDANSETRON HCL 4 MG/2ML IJ SOLN
INTRAMUSCULAR | Status: DC | PRN
  Administered 2018-07-22: 4 mg via INTRAVENOUS

## 2018-07-22 MED ORDER — DEXAMETHASONE SODIUM PHOSPHATE 10 MG/ML IJ SOLN
INTRAMUSCULAR | Status: AC
  Filled 2018-07-22: qty 1

## 2018-07-22 MED ORDER — LIDOCAINE HCL (CARDIAC) PF 100 MG/5ML IV SOSY
PREFILLED_SYRINGE | INTRAVENOUS | Status: DC | PRN
  Administered 2018-07-22: 25 mg via INTRAVENOUS

## 2018-07-22 MED ORDER — SUCCINYLCHOLINE CHLORIDE 20 MG/ML IJ SOLN
INTRAMUSCULAR | Status: DC | PRN
  Administered 2018-07-22: 100 mg via INTRAVENOUS

## 2018-07-22 MED ORDER — SODIUM CHLORIDE 0.9 % IV SOLN
INTRAVENOUS | Status: DC
  Administered 2018-07-22: 1000 mL via INTRAVENOUS

## 2018-07-22 MED ORDER — PROPOFOL 10 MG/ML IV BOLUS
INTRAVENOUS | Status: DC | PRN
  Administered 2018-07-22: 150 mg via INTRAVENOUS

## 2018-07-22 NOTE — Progress Notes (Signed)
[facility] Hospital Day(s): 4.   Post op day(s):  [name]   Interval History: Patient seen and examined, no acute events or new complaints overnight. Patient reports feeling better without the NGT. Refers ostomy is working well.   Vital signs in last 24 hours: [min-max] current  Temp:  [98.1 F (36.7 C)-98.8 F (37.1 C)] 98.1 F (36.7 C) (10/08 0822) Pulse Rate:  [70-85] 74 (10/08 0822) Resp:  [18-19] 18 (10/08 0822) BP: (120-137)/(75-81) 128/75 (10/08 0822) SpO2:  [93 %-100 %] 99 % (10/08 0822)     Height: 5\' 7"  (170.2 cm) Weight: 58.8 kg BMI (Calculated): 20.3    Physical Exam:  Constitutional: alert, cooperative and no distress  Respiratory: breathing non-labored at rest  Cardiovascular: regular rate and sinus rhythm  Gastrointestinal: soft, non-tender, and non-distended. Ostomy pink and patent.   Labs:  CBC Latest Ref Rng & Units 07/21/2018 07/20/2018 07/19/2018  WBC 3.6 - 11.0 K/uL 7.8 9.2 8.2  Hemoglobin 12.0 - 16.0 g/dL 7.4(L) 7.2(L) 8.0(L)  Hematocrit 35.0 - 47.0 % 23.4(L) 22.8(L) 24.7(L)  Platelets 150 - 440 K/uL 327 327 338   CMP Latest Ref Rng & Units 07/20/2018 07/19/2018 07/17/2018  Glucose 70 - 99 mg/dL 46(L) 74 120(H)  BUN 8 - 23 mg/dL 11 21 31(H)  Creatinine 0.44 - 1.00 mg/dL 0.80 1.02(H) 1.68(H)  Sodium 135 - 145 mmol/L 135 134(L) 129(L)  Potassium 3.5 - 5.1 mmol/L 3.6 3.9 4.9  Chloride 98 - 111 mmol/L 110 109 99  CO2 22 - 32 mmol/L 18(L) 19(L) 18(L)  Calcium 8.9 - 10.3 mg/dL 8.0(L) 8.2(L) 9.8  Total Protein 6.5 - 8.1 g/dL 5.3(L) - 8.5(H)  Total Bilirubin 0.3 - 1.2 mg/dL 0.9 - 1.0  Alkaline Phos 38 - 126 U/L 42 - 72  AST 15 - 41 U/L 15 - 24  ALT 0 - 44 U/L 8 - 11    Imaging studies: No new pertinent imaging studies   Assessment/Plan:  62 y.o. female with small bowel obstruction vs ileus with suspected GI bleeding. The bowel obstruction vs ileus has resolved and patient tolerating clear liquids. No nausea or vomiting. Now that bowel function  is back, GI may continue with workup for GI bleeding. If endoscopy is going to be done today, will need to be NPO. If it is not going to be done today, may advance diet to full liquid.   Arnold Long, MD

## 2018-07-22 NOTE — Anesthesia Procedure Notes (Signed)
Procedure Name: Intubation Performed by: Rolla Plate, CRNA Pre-anesthesia Checklist: Patient identified, Patient being monitored, Timeout performed, Emergency Drugs available and Suction available Patient Re-evaluated:Patient Re-evaluated prior to induction Oxygen Delivery Method: Circle system utilized Preoxygenation: Pre-oxygenation with 100% oxygen Induction Type: IV induction Ventilation: Mask ventilation without difficulty Laryngoscope Size: Mac and 3 Grade View: Grade I Tube type: Oral Tube size: 7.0 mm Number of attempts: 1 Airway Equipment and Method: Stylet Placement Confirmation: ETT inserted through vocal cords under direct vision,  positive ETCO2 and breath sounds checked- equal and bilateral Secured at: 21 cm Tube secured with: Tape Dental Injury: Teeth and Oropharynx as per pre-operative assessment

## 2018-07-22 NOTE — Brief Op Note (Signed)
Pt intubated by anesthesia per protocol prior to procedures.

## 2018-07-22 NOTE — Op Note (Addendum)
[facility] Gastroenterology Patient Name: Danielle [name] Procedure Date: 07/22/2018 2:36 PM MRN: [id number] Account #: [id number] Date of Birth: 1956-02-21 Admit Type: Outpatient Age: 62 Room: [facility] ENDO ROOM 1 Gender: Female Note Status: Finalized Procedure:            Upper GI endoscopy Indications:          Iron deficiency anemia secondary to chronic blood loss Providers:            Varnita B. Bonna Gains MD, MD Referring MD:         Forest Gleason Md, MD (Referring MD) Medicines:            Monitored Anesthesia Care Complications:        No immediate complications. Procedure:            Pre-Anesthesia Assessment:                       - The risks and benefits of the procedure and the                        sedation options and risks were discussed with the                        patient. All questions were answered and informed                        consent was obtained.                       - Patient identification and proposed procedure were                        verified prior to the procedure.                       - ASA Grade Assessment: III - A patient with severe                        systemic disease.                       After obtaining informed consent, the endoscope was                        passed under direct vision. Throughout the procedure,                        the patient's blood pressure, pulse, and oxygen                        saturations were monitored continuously. The Endoscope                        was introduced through the mouth, and advanced to the                        second part of duodenum. The upper GI endoscopy was                        accomplished with ease. The patient tolerated the  procedure well. Findings:      LA Grade C (one or more mucosal breaks continuous between tops of 2 or       more mucosal folds, less than 75% circumference) esophagitis with no       bleeding was found in the distal  esophagus.      The entire examined stomach was normal.      The examined duodenum was normal.      Minimal air was used during the examination. The air was suctioned well       right after, to avoid any small bowel dilation. Impression:           - LA Grade C reflux esophagitis. This can explain                        patient's anemia                       - Normal stomach.                       - Normal examined duodenum.                       - No specimens collected. Recommendation:       - Return patient to hospital ward for ongoing care.                       - Use Protonix (pantoprazole) 20 mg PO BID for 1 month.                       - Follow an antireflux regimen.                       - Perform a colonoscopy today.                       - The findings and recommendations were discussed with                        the patient.                       - Continue Serial CBCs and transfuse PRN                       - Return to primary care physician in 2 weeks.                       - Return to GI clinic in 2 weeks. Procedure Code(s):    --- Professional ---                       [phone], Esophagogastroduodenoscopy, flexible, transoral;                        diagnostic, including collection of specimen(s) by                        brushing or washing, when performed (separate procedure) Diagnosis Code(s):    --- Professional ---                       K21.0, Gastro-esophageal reflux  disease with esophagitis                       D50.0, Iron deficiency anemia secondary to blood loss                        (chronic) CPT copyright 2018 American Medical Association. All rights reserved. The codes documented in this report are preliminary and upon coder review may  be revised to meet current compliance requirements.  [name], MD Margretta Sidle B. Bonna Gains MD, MD 07/22/2018 3:10:57 PM This report has been signed electronically. Number of Addenda: 0 Note Initiated On: 07/22/2018 2:36  PM      [facility]

## 2018-07-22 NOTE — Op Note (Signed)
[facility] Gastroenterology Patient Name: Danielle [name] Procedure Date: 07/22/2018 2:37 PM MRN: [id number] Account #: [id number] Date of Birth: [birth year] Admit Type: Outpatient Age: 62 Room: [facility] ENDO ROOM 1 Gender: Female Note Status: Finalized Procedure:            Colonoscopy Providers:            Larone Kliethermes B. Bonna Gains MD, MD Medicines:            Monitored Anesthesia Care Complications:        No immediate complications. Procedure:            Pre-Anesthesia Assessment:                       - Prior to the procedure, a History and Physical was                        performed, and patient medications, allergies and                        sensitivities were reviewed. The patient's tolerance of                        previous anesthesia was reviewed.                       - Prophylactic Antibiotics: The patient does not                        require prophylactic antibiotics.                       - After reviewing the risks and benefits, the patient                        was deemed in satisfactory condition to undergo the                        procedure.                       - Monitored anesthesia care was determined to be                        medically necessary for this procedure based on review                        of the patient's medical history, medications, and                        prior anesthesia history.                       - The anesthesia plan was to use monitored anesthesia                        care (MAC).                       - The risks and benefits of the procedure and the                        sedation options and risks were  discussed with the                        patient. All questions were answered and informed                        consent was obtained.                       - Patient identification and proposed procedure were                        verified prior to the procedure.                       - ASA Grade  Assessment: III - A patient with severe                        systemic disease.                       After obtaining informed consent, the colonoscope was                        passed under direct vision. Throughout the procedure,                        the patient's blood pressure, pulse, and oxygen                        saturations were monitored continuously. The Endoscope                        was introduced through the transverse colostomy and                        advanced to the the transverse colon for evaluation.                        This was the intended extent. After obtaining informed                        consent, the colonoscope was passed under direct                        vision. Throughout the procedure, the patient's blood                        pressure, pulse, and oxygen saturations were monitored                        continuously.The colonoscopy was performed with ease.                        The patient tolerated the procedure well. The quality                        of the bowel preparation was good. Findings:      The Ileum was attempted to be intubated from the Ileostomy site.       However, on careful examination, an adequate opening was not found to  even allow for digital examination of the ileum and thus the scope could       not be inserted for an ileoscopy. The mucus fistula was then examined       and the scope inserted easily into the transverse colon which showed       healthy pink mucosa and no signs of bleeding. The scope was advanced for       a short distance as further evaluation was not necessary and the scope       withdrawn.      The transverse colon appeared normal.      There is no endoscopic evidence of bleeding in the colon. Impression:           - The Ileum was attempted to be intubated from the                        Ileostomy site. However, on careful examination, an                        adequate opening was not found to  even allow for                        digital examination of the ileum and thus the scope                        could not be inserted for an ileoscopy. The mucus                        fistula was then examined and the scope inserted easily                        into the transverse colon which showed healthy pink                        mucosa and no signs of bleeding. The scope was advanced                        for a short distance as further evaluation was not                        necessary and the scope withdrawn.                       - The transverse colon is normal.                       - No specimens collected. Recommendation:       - Consult surgery before discharge to evaluate                        Ileostomy site as revision of site may be needed.                       - Patient's anemia may be explained by her esophagitis,                        but lower sources of anemia cannot be ruled out at this  time as ileoscopy could not be done.                       - Continue Serial CBCs and transfuse PRN                       - The findings and recommendations were discussed with                        the patient.                       - Return patient to hospital ward for ongoing care.                       - Clear liquid diet. Procedure Code(s):    --- Professional ---                       [phone], 53, Colonoscopy through stoma; diagnostic,                        including collection of specimen(s) by brushing or                        washing, when performed (separate procedure) CPT copyright 2018 American Medical Association. All rights reserved. The codes documented in this report are preliminary and upon coder review may  be revised to meet current compliance requirements.  [name], MD Margretta Sidle B. Bonna Gains MD, MD 07/22/2018 3:22:20 PM This report has been signed electronically. Number of Addenda: 0 Note Initiated On: 07/22/2018 2:37  PM Estimated Blood Loss: Estimated blood loss: none.      [facility]

## 2018-07-22 NOTE — Transfer of Care (Signed)
Immediate Anesthesia Transfer of Care Note  Patient: Danielle [name]  Procedure(s) Performed: ESOPHAGOGASTRODUODENOSCOPY (EGD) (N/A ) ILEOSCOPY THROUGH STOMA (N/A ) COLONOSCOPY (N/A )  Patient Location: PACU  Anesthesia Type:General  Level of Consciousness: awake and alert   Airway & Oxygen Therapy: Patient Spontanous Breathing and Patient connected to face mask oxygen  Post-op Assessment: Report given to RN and Post -op Vital signs reviewed and stable  Post vital signs: Reviewed  Last Vitals:  Vitals Value Taken Time  BP 130/105 07/22/2018  3:16 PM  Temp    Pulse 86 07/22/2018  3:17 PM  Resp 15 07/22/2018  3:16 PM  SpO2 100 % 07/22/2018  3:17 PM  Vitals shown include unvalidated device data.  Last Pain:  Vitals:   07/22/18 1416  TempSrc: Tympanic  PainSc: 0-No pain      Patients Stated Pain Goal: 0 (46/28/63 8177)  Complications: No apparent anesthesia complications

## 2018-07-22 NOTE — Progress Notes (Signed)
Danielle Antigua, MD [address], [place], [name], [place], [address] [address], [place], [place], [place], [address] Phone: [phone]  Fax: [phone]   Subjective: Pt tolerating clear liquid diet today, no N/V.    Objective: Exam: Vital signs in last 24 hours: Vitals:   07/21/18 0750 07/21/18 1635 07/21/18 2332 07/22/18 0822  BP: 140/85 137/81 120/78 128/75  Pulse: 95 85 70 74  Resp: 18 18 19 18   Temp: 98.7 F (37.1 C) 98.8 F (37.1 C) 98.3 F (36.8 C) 98.1 F (36.7 C)  TempSrc: Oral Oral Oral Oral  SpO2: 96% 93% 100% 99%  Weight:      Height:       Weight change:   Intake/Output Summary (Last 24 hours) at 07/22/2018 1412 Last data filed at 07/22/2018 0900 Gross per 24 hour  Intake 1155.92 ml  Output 550 ml  Net 605.92 ml    General: No acute distress, AAO x3 Abd: Soft, NT/ND, No HSM Skin: Warm, no rashes Neck: Supple, Trachea midline   Lab Results: Lab Results  Component Value Date   WBC 7.8 07/21/2018   HGB 7.4 (L) 07/21/2018   HCT 23.4 (L) 07/21/2018   MCV 95.2 07/21/2018   PLT 327 07/21/2018   Micro Results: No results found for this or any previous visit (from the past 240 hour(s)). Studies/Results: No results found. Medications:  Scheduled Meds: . [MAR Hold] carvedilol  6.25 mg Oral BID  . [MAR Hold] citalopram  40 mg Oral Daily  . [MAR Hold] docusate sodium  100 mg Oral BID  . [MAR Hold] ferrous sulfate  325 mg Oral BID WC  . [MAR Hold] fluticasone furoate-vilanterol  1 puff Inhalation Daily  . [MAR Hold] furosemide  20 mg Oral Daily  . [MAR Hold] megestrol  300 mg Oral BID  . [MAR Hold] multivitamin with minerals  1 tablet Oral Daily  . [MAR Hold] pantoprazole (PROTONIX) IV  40 mg Intravenous Q12H   Continuous Infusions: PRN Meds:.[MAR Hold] acetaminophen **OR** [MAR Hold] acetaminophen, [MAR Hold] albuterol, [MAR Hold] alum & mag hydroxide-simeth, [MAR Hold] HYDROcodone-acetaminophen, [MAR Hold]  HYDROmorphone  (DILAUDID) injection, [MAR Hold] ondansetron **OR** [MAR Hold] ondansetron (ZOFRAN) IV   Assessment: Anemia  Plan: Patient has been evaluated by surgery and as per them ileus/small bowel obstruction is resolved and patient can continue with further GI work-up and endoscopies  She does not have any further nausea or vomiting, is not requiring an NG tube, and is tolerating clear liquids without difficulty.  This is also consistent with clinically resolved ileus  Her hemoglobin has been downtrending and the etiology is unknown Her ileostomy bag has dark green to black liquidy output, and patient states that this has been the same since her surgery.  Therefore, due to ongoing anemia with low hemoglobin at 7.4, an inpatient endoscopy would allow for evaluation and treatment of underlying etiology of her anemia, and benefits of procedure outweigh risks at this time.  I have discussed this with the patient, and we will proceed with EGD first, and if this is negative, proceed with ileoscopy, and possibly evaluate her mucus fistula site as well.  Patient's procedures are scheduled to be 2 hours after she had her last clear liquid meal.  Anesthesia has evaluated the patient is agreeable with above.  Plan discussed extensively with the patient and she is agreeable as well  Alternative of outpatient evaluation would put patient at further risk of further anemia without figuring out the source of  her worsening anemia.  I have discussed alternative options, risks & benefits,  which include, but are not limited to, bleeding, infection, perforation,respiratory complication & drug reaction.  The patient agrees with this plan & written consent will be obtained.       LOS: 4 days   Danielle Antigua, MD 07/22/2018, 2:12 PM

## 2018-07-22 NOTE — Anesthesia Post-op Follow-up Note (Signed)
Anesthesia QCDR form completed.        

## 2018-07-22 NOTE — Consult Note (Addendum)
[facility] Nurse ostomy consult note Consult requested to assist with ostomy supplies.  Pt is familiar to [facility] team from previous admissions.  She had an ileostomy and mucous fistula surgery performed about 3 months ago and states she and her daughter change and empty the pouches and she has approx 5 days of wear time.  The left side is a mucous fistula and the pouch was applied by the beside nurse earlier today.  Stoma is red and viable, flush with skin level, when visualized through the pouch.  Small amt yellow mucous drainage. Right side with ileostomy; pouch was changed yesterday by the bedside nurse.  Mod amt liquid brown stool, one piece convex pouch is being used with barrier ring and a belt.    Extra supplies left at the bedside for patient and staff nurse use.  Pt denies further problems with skin maceration or questions regaring the ostomies. Please re-consult if further assistance is needed.  Thank-you,  Julien Girt MSN, [place], [place], [place], [place]

## 2018-07-22 NOTE — Progress Notes (Signed)
[place] [facility] [place] NAME: Nafisa Olds    MR#:  [id number]  DATE OF BIRTH:  Jul 14, 1956  SUBJECTIVE:   NG removed. 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  HENT: Negative for ear discharge, ear pain and nosebleeds.   Eyes: Negative for blurred vision, pain and discharge.  Respiratory: Negative for sputum production, shortness of breath, wheezing and stridor.   Cardiovascular: Negative for chest pain, palpitations, orthopnea and PND.  Gastrointestinal: Negative for abdominal pain, diarrhea, nausea and vomiting.  Genitourinary: Negative for frequency and urgency.  Musculoskeletal: Negative for back pain and joint pain.  Neurological: Negative for sensory change, speech change, focal weakness and weakness.  Psychiatric/Behavioral: Negative for depression and hallucinations. The patient is not nervous/anxious.    Tolerating Diet:CLD Tolerating PT: ambulatory  DRUG ALLERGIES:  No Known Allergies  VITALS:  Blood pressure 128/75, pulse 74, temperature 98.1 F (36.7 C), temperature source Oral, resp. rate 18, height 5\' 7"  (1.702 m), weight 58.8 kg, SpO2 99 %.  PHYSICAL EXAMINATION:   Physical Exam  GENERAL:  62 y.o.-year-old patient lying in the bed with no acute distress.  EYES: Pupils equal, round, reactive to light and accommodation. No scleral icterus. Extraocular muscles intact.  HEENT: Head atraumatic, normocephalic. Oropharynx and nasopharynx clear.  NECK:  Supple, no jugular venous distention. No thyroid enlargement, no tenderness.  LUNGS: Normal breath sounds bilaterally, no wheezing, rales, rhonchi. No use of accessory muscles of respiration.  CARDIOVASCULAR: S1, S2 normal. No murmurs, rubs, or gallops.  ABDOMEN: Soft, nontender, nondistended. Bowel sounds present. No organomegaly or mass.colostomy and  enterostomy + EXTREMITIES: No cyanosis, clubbing or edema b/l.    NEUROLOGIC: Cranial nerves II through XII are intact. No focal Motor or sensory deficits b/l.   PSYCHIATRIC:  patient is alert and oriented x 3.  SKIN: No obvious rash, lesion, or ulcer.   LABORATORY PANEL:  CBC Recent Labs  Lab 07/21/18 0303  WBC 7.8  HGB 7.4*  HCT 23.4*  PLT 327    Chemistries  Recent Labs  Lab 07/20/18 0327  NA 135  K 3.6  CL 110  CO2 18*  GLUCOSE 46*  BUN 11  CREATININE 0.80  CALCIUM 8.0*  AST 15  ALT 8  ALKPHOS 42  BILITOT 0.9   Cardiac Enzymes No results for input(s): TROPONINI in the last 168 hours. RADIOLOGY:  No results found. ASSESSMENT AND PLAN:   62 year old female admitted for small bowel obstruction  *Small bowel obstruction.  Recurrent problem.  NG tube in place and slowly improving.  Appreciate surgery input. NG tube clamped today. Will monitor output. Continue IV fluids at 85 an hour. -IV Protonix BID for heartburn--po protonix -G.I. consultation appreciated. Patient is now off eliquis. Possible EGD/small capsule endoscopy -d/w dr Peyton Najjar  *Acute kidney injury due to dehydration.  Resolved with IV fluids. D/c IVF  *COPD: Continue inhaled corticosteroid   *Paroxysmal atrial fibrillation.  holding Eliquis for now till GI w/u done. Dr Bonna Gains infromed.  *Anemia of chronic disease hgn 7.6 Transfuse to keep >7  *Chronic systolic congestive heart failure with ejection fraction 35 to 40%.  Stable  Case discussed with Care Management/Social Worker. Management plans discussed with the patient, family and they are in agreement.  CODE STATUS: full  DVT Prophylaxis: Scd  TOTAL TIME TAKING CARE OF THIS PATIENT: *30* minutes.  >50% time  spent on counselling and coordination of care  POSSIBLE D/C IN few DAYS, DEPENDING ON CLINICAL CONDITION.  Note: This dictation was prepared with Dragon dictation along with smaller phrase technology. Any  transcriptional errors that result from this process are unintentional.  Fritzi Mandes M.D on 07/22/2018 at 12:33 PM  Between 7am to 6pm - Pager - [phone]  After 6pm go to [email or web address] - password [email or web address] [facility]  Office  [phone]  CC: Primary care physician; Elisabeth Cara, NPPatient ID: [name], female   DOB: 06/04/1956, 62 y.o.   MRN: [id number]

## 2018-07-22 NOTE — Anesthesia Preprocedure Evaluation (Addendum)
Anesthesia Evaluation  Patient identified by MRN, date of birth, ID band Patient awake    Reviewed: Allergy & Precautions, H&P , NPO status , Patient's Chart, lab work & pertinent test results  Airway Mallampati: III  TM Distance: >3 FB Neck ROM: full    Dental  (+) Missing, Chipped, Poor Dentition   Pulmonary neg pulmonary ROS, shortness of breath, COPD,  oxygen dependent, former smoker,    breath sounds clear to auscultation       Cardiovascular hypertension, +CHF and + DOE  negative cardio ROS  + Valvular Problems/Murmurs  Rhythm:regular Rate:Normal  - Left ventricle: Systolic function was moderately reduced. The   estimated ejection fraction was in the range of 35% to 40%. Mod MR   Neuro/Psych PSYCHIATRIC DISORDERS Anxiety Depression negative neurological ROS  negative psych ROS   GI/Hepatic negative GI ROS, Neg liver ROS, PUD,   Endo/Other  negative endocrine ROSdiabetes  Renal/GU ARFRenal disease     Musculoskeletal  (+) Arthritis ,   Abdominal   Peds  Hematology  (+) Blood dyscrasia, anemia ,   Anesthesia Other Findings Past Medical History: No date: Anxiety No date: Arthritis left : Cancer ([place])     Comment:  breast cancer 2000, chemo tx's with total mastectomy and              lymph nodes resected.  02/21/2016: Cancer of right lung ([place])     Comment:  rad tx's.  No date: CHF (congestive heart failure) (HCC) No date: COPD (chronic obstructive pulmonary disease) (HCC) No date: Dependence on supplemental oxygen No date: Depression No date: Diabetes mellitus without complication (HCC) No date: Heart murmur No date: Hypertension No date: Lung nodule No date: Lymphedema No date: Shortness of breath dyspnea     Comment:  with exertion 2001: Status post chemotherapy     Comment:  left breast cancer 2001: Status post radiation therapy     Comment:  left breast cancer  Past Surgical History: No date:  Breast Biospy; Left     Comment:  [facility] No date: BREAST SURGERY 04/30/2018: COLONOSCOPY; N/A     Comment:  Procedure: COLONOSCOPY;  Surgeon: Virgel Manifold,               MD;  Location: [facility] ENDOSCOPY;  Service: Endoscopy;                Laterality: N/A; No date: DILATION AND CURETTAGE OF UTERUS 04/11/2016: ELECTROMAGNETIC NAVIGATION BROCHOSCOPY; Right     Comment:  Procedure: ELECTROMAGNETIC NAVIGATION BRONCHOSCOPY;                Surgeon: Vilinda Boehringer, MD;  Location: [facility] ORS;                Service: Cardiopulmonary;  Laterality: Right; 05/07/2018: ESOPHAGOGASTRODUODENOSCOPY (EGD) WITH PROPOFOL; N/A     Comment:  Procedure: ESOPHAGOGASTRODUODENOSCOPY (EGD) WITH               PROPOFOL;  Surgeon: Lucilla Lame, MD;  Location: [facility]               ENDOSCOPY;  Service: Endoscopy;  Laterality: N/A; No date: history of colonoscopy] No date: ILEOSTOMY 05/04/2018: LAPAROTOMY; Right     Comment:  Procedure: EXPLORATORY LAPAROTOMY right colectomy right               and left ostomy;  Surgeon: Herbert Pun, MD;                Location: [facility] ORS;  Service: General;  Laterality:               Right; No date: LUNG BIOPSY No date: MASTECTOMY; Left     Comment:  2000, [facility] No date: ROTATOR CUFF REPAIR; Right     Comment:  [facility]  BMI    Body Mass Index:  20.30 kg/m      Reproductive/Obstetrics negative OB ROS                           Anesthesia Physical Anesthesia Plan  ASA: IV  Anesthesia Plan: General ETT   Post-op Pain Management:    Induction:   PONV Risk Score and Plan:   Airway Management Planned:   Additional Equipment:   Intra-op Plan:   Post-operative Plan:   Informed Consent: I have reviewed the patients History and Physical, chart, labs and discussed the procedure including the risks, benefits and alternatives for the proposed anesthesia with the patient or authorized representative who has indicated his/her understanding and  acceptance.   Dental Advisory Given  Plan Discussed with: Anesthesiologist, CRNA and Surgeon  Anesthesia Plan Comments:        Anesthesia Quick Evaluation

## 2018-07-23 ENCOUNTER — Encounter: Payer: Self-pay | Admitting: Gastroenterology

## 2018-07-23 MED ORDER — PANTOPRAZOLE SODIUM 40 MG PO TBEC: 40.0000 mg | DELAYED_RELEASE_TABLET | Freq: Two times a day (BID) | ORAL | 0 refills | Status: DC

## 2018-07-23 NOTE — Discharge Instructions (Signed)
Resume diet and activity as before.  You can restart taking your eliquis from 07/27/2018.

## 2018-07-23 NOTE — Anesthesia Postprocedure Evaluation (Signed)
Anesthesia Post Note  Patient: Danielle [name]  Procedure(s) Performed: ESOPHAGOGASTRODUODENOSCOPY (EGD) (N/A ) ILEOSCOPY THROUGH STOMA (N/A ) COLONOSCOPY (N/A )  Patient location during evaluation: PACU Anesthesia Type: General Level of consciousness: awake and alert Pain management: pain level controlled Vital Signs Assessment: post-procedure vital signs reviewed and stable Respiratory status: spontaneous breathing, nonlabored ventilation, respiratory function stable and patient connected to nasal cannula oxygen Cardiovascular status: blood pressure returned to baseline and stable Postop Assessment: no apparent nausea or vomiting Anesthetic complications: no     Last Vitals:  Vitals:   07/22/18 2332 07/23/18 0748  BP: 134/77 118/62  Pulse: 71 70  Resp: 20   Temp: 37.2 C 36.8 C  SpO2: 100% 97%    Last Pain:  Vitals:   07/23/18 0935  TempSrc:   PainSc: [place]

## 2018-07-23 NOTE — Progress Notes (Signed)
DISCHARGE NOTE:  Pt given discharge instructions and script (protonix). Pt verbalized understanding. Pt waiting on ride.

## 2018-07-23 NOTE — Progress Notes (Signed)
[place] Hospital Day(s): 5.   Post op day(s): 1 Day Post-Op.   Interval History: Patient seen and examined, no acute events or new complaints overnight. Patient reports feeling well today. Denies nausea and vomiting. Refers tolerating diet.   Vital signs in last 24 hours: [min-max] current  Temp:  [98 F (36.7 C)-99.5 F (37.5 C)] 98.2 F (36.8 C) (10/09 0748) Pulse Rate:  [63-85] 70 (10/09 0748) Resp:  [15-20] 20 (10/08 2332) BP: (118-148)/(62-80) 118/62 (10/09 0748) SpO2:  [97 %-100 %] 97 % (10/09 0748) Weight:  [58.9 kg] 58.9 kg (10/09 0556)     Height: 5\' 7"  (170.2 cm) Weight: 58.9 kg BMI (Calculated): 20.32    Ileostomy output: 750 mL in 24 hours  Physical Exam:  Constitutional: alert, cooperative and no distress  Respiratory: breathing non-labored at rest  Cardiovascular: regular rate and sinus rhythm  Gastrointestinal: soft, non-tender, and non-distended. Ostomy pink and patent. Ileostomy with narrow opening but patent. Adequate output.   Labs:  CBC Latest Ref Rng & Units 07/21/2018 07/20/2018 07/19/2018  WBC 3.6 - 11.0 K/uL 7.8 9.2 8.2  Hemoglobin 12.0 - 16.0 g/dL 7.4(L) 7.2(L) 8.0(L)  Hematocrit 35.0 - 47.0 % 23.4(L) 22.8(L) 24.7(L)  Platelets 150 - 440 K/uL 327 327 338   CMP Latest Ref Rng & Units 07/20/2018 07/19/2018 07/17/2018  Glucose 70 - 99 mg/dL 46(L) 74 120(H)  BUN 8 - 23 mg/dL 11 21 31(H)  Creatinine 0.44 - 1.00 mg/dL 0.80 1.02(H) 1.68(H)  Sodium 135 - 145 mmol/L 135 134(L) 129(L)  Potassium 3.5 - 5.1 mmol/L 3.6 3.9 4.9  Chloride 98 - 111 mmol/L 110 109 99  CO2 22 - 32 mmol/L 18(L) 19(L) 18(L)  Calcium 8.9 - 10.3 mg/dL 8.0(L) 8.2(L) 9.8  Total Protein 6.5 - 8.1 g/dL 5.3(L) - 8.5(H)  Total Bilirubin 0.3 - 1.2 mg/dL 0.9 - 1.0  Alkaline Phos 38 - 126 U/L 42 - 72  AST 15 - 41 U/L 15 - 24  ALT 0 - 44 U/L 8 - 11    Imaging studies: No new pertinent imaging studies  Upper endoscopy and colonoscopy report and images personally reviewed.     Assessment/Plan:  62 y.o. female with intermittent episodes of small bowel obstruction and upper GI bleeding.  GI service assess upper GI coming from esophagitis. I recommend Cardiology evaluation and recommendations regarding anticoagulation management.   Small bowel obstruction may come from adhesions or from ileostomy stricture. If it was from the ileostomy stricture it should not be episodic since the narrowing of the ileostomy does not stretch and should be a more constant problem. I offer the patient and the daughter a trial of ileostomy dilation but since she is feeling fine, no pain and the ostomy is working again, they prefer to avoid any procedure at this moment. I oriented the daughter that she might needed it at any point. That will also help to evaluate if the problem is from the narrowing or due to adhesions. If she is successfully dilated and she does not has any other obstructive pattern until it narrows again, then the problem is the ileostomy stricture. If it is successfully dilated and she continue having bowel obstruction episode, it will be more consistent with adhesions. From surgery stand point, will discharge and follow up at the clinic.   Arnold Long, MD

## 2018-07-24 NOTE — Discharge Summary (Signed)
[facility] NAME: Mellie Buccellato    MR#:  [id number]  DATE OF BIRTH:  [birth year]  DATE OF ADMISSION:  07/17/2018 ADMITTING PHYSICIAN: Harrie Foreman, MD  DATE OF DISCHARGE: 07/23/2018  2:12 PM  PRIMARY CARE PHYSICIAN: Elisabeth Cara, NP   ADMISSION DIAGNOSIS:  Hyponatremia [E87.1] Small bowel obstruction ([place]) [K56.609] Acute renal failure, unspecified acute renal failure type ([place]) [N17.9] Hematemesis with nausea [K92.0] SBO (small bowel obstruction) ([place]) [K56.609]  DISCHARGE DIAGNOSIS:  Active Problems:   SBO (small bowel obstruction) (HCC)   Reflux esophagitis   Iron deficiency anemia secondary to blood loss (chronic)   SECONDARY DIAGNOSIS:   Past Medical History:  Diagnosis Date  . Anxiety   . Arthritis   . Cancer ([place]) left    breast cancer 2000, chemo tx's with total mastectomy and lymph nodes resected.   . Cancer of right lung ([place]) 02/21/2016   rad tx's.   . CHF (congestive heart failure) ([place])   . COPD (chronic obstructive pulmonary disease) ([place])   . Dependence on supplemental oxygen   . Depression   . Diabetes mellitus without complication ([place])   . Heart murmur   . Hypertension   . Lung nodule   . Lymphedema   . Shortness of breath dyspnea    with exertion  . Status post chemotherapy 2001   left breast cancer  . Status post radiation therapy 2001   left breast cancer     ADMITTING HISTORY  Chief Complaint: Vomiting HPI: The patient well-known to the hospitalist service presents to the emergency department a few days after discharge for small bowel obstruction complaining of couple episodes of dark-colored emesis and ostomy output.  The patient states that she has been unable to eat for a few days.  She denies fever but admits to mild abdominal pain.  An NG tube was placed in the emergency department prior to the treatment team calling the hospitalist service for admission.  HOSPITAL COURSE:    62 year old female admitted for small bowel obstruction  *Small bowel obstruction. Patient had NG tube placed.  Surgery was consulted.  Slowly patient improved and NG tube was clamped.  After no further vomiting NG tube was removed.  Patient is tolerating diet.  Discussed with Dr. Peyton Najjar on day of discharge.  *Upper GI bleed.  Patient had some drop in her hemoglobin.  Patient was on Eliquis for atrial fibrillation which was stopped.  EGD was done which showed esophagitis likely due to vomiting.  *Acute kidney injury due to dehydration. Resolved with IV fluids. D/c IVF  *COPD: Continue inhaled corticosteroid   *Paroxysmal atrial fibrillation.  Medications were continued except Eliquis which was held. Advised patient to hold Eliquis for 3 more days and restart if no further bleeding.  *Anemia of chronic disease hgn 7.6 stable  *Chronic systolic congestive heart failure with ejection fraction 35 to 40%.  Stable  Stable for discharge home  CONSULTS OBTAINED:  Treatment Team:  Jonathon Bellows, MD Herbert Pun, MD  DRUG ALLERGIES:  No Known Allergies  DISCHARGE MEDICATIONS:   Allergies as of 07/23/2018   No Known Allergies     Medication List    TAKE these medications   albuterol 108 (90 Base) MCG/ACT inhaler Commonly known as:  PROVENTIL HFA;VENTOLIN HFA Inhale 2 puffs into the lungs every 6 (six) hours as needed for wheezing or shortness of breath.   apixaban 5 MG Tabs tablet Commonly known as:  ELIQUIS Take  1 tablet (5 mg total) by mouth 2 (two) times daily.   budesonide-formoterol 160-4.5 MCG/ACT inhaler Commonly known as:  SYMBICORT Inhale 2 puffs into the lungs 2 (two) times daily.   carvedilol 6.25 MG tablet Commonly known as:  COREG Take 1 tablet (6.25 mg total) by mouth 2 (two) times daily.   citalopram 40 MG tablet Commonly known as:  CELEXA Take 40 mg by mouth daily.   ferrous sulfate 325 (65 FE) MG tablet Take 1 tablet (325 mg total)  by mouth 2 (two) times daily with a meal.   furosemide 20 MG tablet Commonly known as:  LASIX Take 20 mg by mouth daily.   HYDROcodone-acetaminophen 5-325 MG tablet Commonly known as:  NORCO/VICODIN Take 1 tablet by mouth every 6 (six) hours as needed for moderate pain or severe pain.   megestrol 400 MG/10ML suspension Commonly known as:  MEGACE Take 7.5 mLs (300 mg total) by mouth 2 (two) times daily.   multivitamin with minerals Tabs tablet Take 1 tablet by mouth daily.   pantoprazole 40 MG tablet Commonly known as:  PROTONIX Take 1 tablet (40 mg total) by mouth 2 (two) times daily before a meal.       Today   VITAL SIGNS:  Blood pressure 118/62, pulse 70, temperature 98.2 F (36.8 C), temperature source Oral, resp. rate 20, height 5\' 7"  (1.702 m), weight 58.9 kg, SpO2 97 %.  I/O:  No intake or output data in the 24 hours ending 07/24/18 1622  PHYSICAL EXAMINATION:  Physical Exam  GENERAL:  62 y.o.-year-old patient lying in the bed with no acute distress.  LUNGS: Normal breath sounds bilaterally, no wheezing, rales,rhonchi or crepitation. No use of accessory muscles of respiration.  CARDIOVASCULAR: S1, S2 normal. No murmurs, rubs, or gallops.  ABDOMEN: Soft, non-tender, non-distended. Bowel sounds present. No organomegaly or mass.  NEUROLOGIC: Moves all 4 extremities. PSYCHIATRIC: The patient is alert and oriented x 3.  SKIN: No obvious rash, lesion, or ulcer.   DATA REVIEW:   CBC Recent Labs  Lab 07/21/18 0303  WBC 7.8  HGB 7.4*  HCT 23.4*  PLT 327    Chemistries  Recent Labs  Lab 07/20/18 0327  NA 135  K 3.6  CL 110  CO2 18*  GLUCOSE 46*  BUN 11  CREATININE 0.80  CALCIUM 8.0*  AST 15  ALT 8  ALKPHOS 42  BILITOT 0.9    Cardiac Enzymes No results for input(s): TROPONINI in the last 168 hours.  Microbiology Results  Results for orders placed or performed during the hospital encounter of 04/21/18  MRSA PCR Screening     Status: None    Collection Time: 04/21/18  6:53 AM  Result Value Ref Range Status   MRSA by PCR NEGATIVE NEGATIVE Final    Comment:        The GeneXpert MRSA Assay (FDA approved for NASAL specimens only), is one component of a comprehensive MRSA colonization surveillance program. It is not intended to diagnose MRSA infection nor to guide or monitor treatment for MRSA infections. Performed at [facility], [address] [place], [place] [address]   C difficile quick scan w PCR reflex     Status: None   Collection Time: 04/27/18  2:36 PM  Result Value Ref Range Status   C Diff antigen NEGATIVE NEGATIVE Final   C Diff toxin NEGATIVE NEGATIVE Final   C Diff interpretation No C. difficile detected.  Final    Comment: Performed at [facility]  [facility], [address] [place], [place] [address]  Culture, respiratory (NON-Expectorated)     Status: None   Collection Time: 05/05/18 11:32 AM  Result Value Ref Range Status   Specimen Description   Final    TRACHEAL ASPIRATE Performed at [facility], [address], [place], [place] [address]    Special Requests   Final    NONE Performed at [facility], [address] [place], [place] [address]    Gram Stain   Final    RARE WBC PRESENT, PREDOMINANTLY PMN RARE GRAM POSITIVE COCCI RARE GRAM POSITIVE RODS    Culture   Final    Consistent with normal respiratory flora. Performed at [facility], [place] [address], [place],  [address]    Report Status 05/07/2018 FINAL  Final    RADIOLOGY:  No results found.  Follow up with PCP in 1 week.  Management plans discussed with the patient, family and they are in agreement.  CODE STATUS:  Code Status History    Date Active Date Inactive Code Status Order ID Comments User Context   07/21/2018 0332 07/23/2018 1718 Full Code [id number]  Derrek Monaco, RN Inpatient   07/18/2018 0640 07/21/2018 0330 DNR [id number]  Harrie Foreman, MD Inpatient   07/05/2018  0808 07/09/2018 1946 DNR [id number]  Harrie Foreman, MD Inpatient   06/30/2018 1733 07/02/2018 1507 DNR [id number]  [name], MD Inpatient   06/30/2018 0019 06/30/2018 1733 Full Code [id number]  Amelia Jo, MD Inpatient   06/04/2018 0113 06/05/2018 1448 Full Code [id number]  Amelia Jo, MD Inpatient   04/21/2018 0633 05/17/2018 2214 Full Code [id number]  Arta Silence, MD Inpatient   02/09/2017 1125 02/11/2017 1706 Full Code [id number]  Henreitta Leber, MD Inpatient    Advance Directive Documentation     Most Recent Value  Type of Advance Directive  Healthcare Power of Attorney, Living will  Pre-existing out of facility DNR order (yellow form or pink MOST form)  -  "MOST" Form in Place?  -      TOTAL TIME TAKING CARE OF THIS PATIENT ON DAY OF DISCHARGE: more than 30 minutes.   Leia Alf Kathren Scearce M.D on 07/24/2018 at 4:22 PM  Between 7am to 6pm - Pager - [phone]  After 6pm go to [email or web address] - password [email or web address] [facility]  Office  [phone]  CC: Primary care physician; Elisabeth Cara, NP  Note: This dictation was prepared with Dragon dictation along with smaller phrase technology. Any transcriptional errors that result from this process are unintentional.

## 2018-07-28 ENCOUNTER — Ambulatory Visit: Payer: Self-pay | Admitting: Pharmacy Technician

## 2018-07-28 DIAGNOSIS — Z79899 Other long term (current) drug therapy: Secondary | ICD-10-CM

## 2018-07-28 NOTE — Progress Notes (Signed)
Met with patient completed financial assistance application for [facility] due to recent hospital visit.  Patient agreed to be responsible for gathering financial information and forwarding to appropriate department in [facility].    Completed [facility] application and contract.  Patient agreed to all terms of the [facility] contract.    Patient approved to receive medication assistance until 03/16/2019.  Patient will be eligible for Medicare beginning 03/16/2019.  Patient acknowledged that she understands that [facility] will no longer be able to provide medication assistance beginning 03/16/2019.    Provided patient with community resource material based on her particular needs.    Eliquis, Symbicort, Ventolin & Spirvia  Prescription Applications completed with patient.  Patient had an appointment on 07/28/18 with provider.  Patient delivered all Prescriptions Applications to [facility].  Upon receipt of signed applications from provider, Eliquis, Symbicort, Ventolin & Spiriva Prescription Applications will be submitted to [facility].  Referred patient for MTM at [facility]  [facility]

## 2018-08-12 ENCOUNTER — Other Ambulatory Visit: Payer: Self-pay

## 2018-08-12 ENCOUNTER — Emergency Department: Payer: Medicaid Other

## 2018-08-12 ENCOUNTER — Encounter: Payer: Self-pay | Admitting: Emergency Medicine

## 2018-08-12 ENCOUNTER — Inpatient Hospital Stay
Admission: EM | Admit: 2018-08-12 | Discharge: 2018-08-16 | DRG: 330 | Disposition: A | Discharge status: Home / Self Care | Payer: Medicaid Other | Attending: Internal Medicine | Admitting: Internal Medicine

## 2018-08-12 DIAGNOSIS — E861 Hypovolemia: Secondary | ICD-10-CM | POA: Diagnosis present

## 2018-08-12 DIAGNOSIS — E860 Dehydration: Secondary | ICD-10-CM | POA: Diagnosis present

## 2018-08-12 DIAGNOSIS — Z7901 Long term (current) use of anticoagulants: Secondary | ICD-10-CM

## 2018-08-12 DIAGNOSIS — Z85118 Personal history of other malignant neoplasm of bronchus and lung: Secondary | ICD-10-CM | POA: Diagnosis not present

## 2018-08-12 DIAGNOSIS — I480 Paroxysmal atrial fibrillation: Secondary | ICD-10-CM | POA: Diagnosis present

## 2018-08-12 DIAGNOSIS — Z923 Personal history of irradiation: Secondary | ICD-10-CM

## 2018-08-12 DIAGNOSIS — Z9012 Acquired absence of left breast and nipple: Secondary | ICD-10-CM

## 2018-08-12 DIAGNOSIS — Z803 Family history of malignant neoplasm of breast: Secondary | ICD-10-CM | POA: Diagnosis not present

## 2018-08-12 DIAGNOSIS — Z9049 Acquired absence of other specified parts of digestive tract: Secondary | ICD-10-CM

## 2018-08-12 DIAGNOSIS — E119 Type 2 diabetes mellitus without complications: Secondary | ICD-10-CM | POA: Diagnosis present

## 2018-08-12 DIAGNOSIS — F419 Anxiety disorder, unspecified: Secondary | ICD-10-CM | POA: Diagnosis present

## 2018-08-12 DIAGNOSIS — J449 Chronic obstructive pulmonary disease, unspecified: Secondary | ICD-10-CM | POA: Diagnosis present

## 2018-08-12 DIAGNOSIS — I110 Hypertensive heart disease with heart failure: Secondary | ICD-10-CM | POA: Diagnosis present

## 2018-08-12 DIAGNOSIS — I5022 Chronic systolic (congestive) heart failure: Secondary | ICD-10-CM | POA: Diagnosis present

## 2018-08-12 DIAGNOSIS — K8689 Other specified diseases of pancreas: Secondary | ICD-10-CM | POA: Diagnosis present

## 2018-08-12 DIAGNOSIS — I700 Atherosclerosis of aorta: Secondary | ICD-10-CM | POA: Diagnosis present

## 2018-08-12 DIAGNOSIS — E785 Hyperlipidemia, unspecified: Secondary | ICD-10-CM | POA: Diagnosis present

## 2018-08-12 DIAGNOSIS — I2510 Atherosclerotic heart disease of native coronary artery without angina pectoris: Secondary | ICD-10-CM | POA: Diagnosis present

## 2018-08-12 DIAGNOSIS — Z87891 Personal history of nicotine dependence: Secondary | ICD-10-CM | POA: Diagnosis not present

## 2018-08-12 DIAGNOSIS — K9413 Enterostomy malfunction: Secondary | ICD-10-CM | POA: Diagnosis present

## 2018-08-12 DIAGNOSIS — K56600 Partial intestinal obstruction, unspecified as to cause: Secondary | ICD-10-CM | POA: Diagnosis present

## 2018-08-12 DIAGNOSIS — F329 Major depressive disorder, single episode, unspecified: Secondary | ICD-10-CM | POA: Diagnosis present

## 2018-08-12 DIAGNOSIS — R109 Unspecified abdominal pain: Secondary | ICD-10-CM | POA: Diagnosis present

## 2018-08-12 DIAGNOSIS — M1990 Unspecified osteoarthritis, unspecified site: Secondary | ICD-10-CM | POA: Diagnosis present

## 2018-08-12 DIAGNOSIS — Z66 Do not resuscitate: Secondary | ICD-10-CM | POA: Diagnosis present

## 2018-08-12 DIAGNOSIS — Z9221 Personal history of antineoplastic chemotherapy: Secondary | ICD-10-CM

## 2018-08-12 DIAGNOSIS — E871 Hypo-osmolality and hyponatremia: Secondary | ICD-10-CM | POA: Diagnosis present

## 2018-08-12 DIAGNOSIS — D649 Anemia, unspecified: Secondary | ICD-10-CM | POA: Diagnosis present

## 2018-08-12 DIAGNOSIS — N179 Acute kidney failure, unspecified: Secondary | ICD-10-CM | POA: Diagnosis present

## 2018-08-12 DIAGNOSIS — Z853 Personal history of malignant neoplasm of breast: Secondary | ICD-10-CM | POA: Diagnosis not present

## 2018-08-12 LAB — CBC
HEMATOCRIT: 29.2 % — AB (ref 36.0–46.0)
HEMOGLOBIN: 9.4 g/dL — AB (ref 12.0–15.0)
MCH: 29.5 pg (ref 26.0–34.0)
MCHC: 32.2 g/dL (ref 30.0–36.0)
MCV: 91.5 fL (ref 80.0–100.0)
Platelets: 328 10*3/uL (ref 150–400)
RBC: 3.19 MIL/uL — AB (ref 3.87–5.11)
RDW: 13.2 % (ref 11.5–15.5)
WBC: 7.1 10*3/uL (ref 4.0–10.5)
nRBC: 0 % (ref 0.0–0.2)

## 2018-08-12 LAB — COMPREHENSIVE METABOLIC PANEL
ALBUMIN: 4.2 g/dL (ref 3.5–5.0)
ALT: 13 U/L (ref 0–44)
ANION GAP: 7 (ref 5–15)
AST: 26 U/L (ref 15–41)
Alkaline Phosphatase: 82 U/L (ref 38–126)
BILIRUBIN TOTAL: 0.5 mg/dL (ref 0.3–1.2)
BUN: 51 mg/dL — AB (ref 8–23)
CHLORIDE: 103 mmol/L (ref 98–111)
CO2: 20 mmol/L — AB (ref 22–32)
Calcium: 9.2 mg/dL (ref 8.9–10.3)
Creatinine, Ser: 2.35 mg/dL — ABNORMAL HIGH (ref 0.44–1.00)
GFR calc Af Amer: 24 mL/min — ABNORMAL LOW (ref >60)
GFR calc non Af Amer: 21 mL/min — ABNORMAL LOW (ref >60)
GLUCOSE: 85 mg/dL (ref 70–99)
POTASSIUM: 4.6 mmol/L (ref 3.5–5.1)
SODIUM: 130 mmol/L — AB (ref 135–145)
TOTAL PROTEIN: 7.7 g/dL (ref 6.5–8.1)

## 2018-08-12 LAB — PHOSPHORUS: Phosphorus: 4.6 mg/dL (ref 2.5–4.6)

## 2018-08-12 LAB — NA AND K (SODIUM & POTASSIUM), RAND UR: POTASSIUM UR: 14 mmol/L

## 2018-08-12 LAB — CREATININE, URINE, RANDOM: Creatinine, Urine: 81 mg/dL

## 2018-08-12 LAB — MAGNESIUM: MAGNESIUM: 2.2 mg/dL (ref 1.7–2.4)

## 2018-08-12 LAB — LIPASE, BLOOD: Lipase: 54 U/L — ABNORMAL HIGH (ref 11–51)

## 2018-08-12 LAB — PROTEIN, URINE, RANDOM

## 2018-08-12 MED ORDER — PANTOPRAZOLE SODIUM 40 MG PO TBEC
40.0000 mg | DELAYED_RELEASE_TABLET | Freq: Every day | ORAL | Status: DC
  Administered 2018-08-12 – 2018-08-16 (×4): 40 mg via ORAL
  Filled 2018-08-12 (×4): qty 1

## 2018-08-12 MED ORDER — CARVEDILOL 6.25 MG PO TABS
6.2500 mg | ORAL_TABLET | Freq: Two times a day (BID) | ORAL | Status: DC
  Administered 2018-08-12 – 2018-08-16 (×9): 6.25 mg via ORAL
  Filled 2018-08-12 (×9): qty 1

## 2018-08-12 MED ORDER — ACETAMINOPHEN 325 MG PO TABS
650.0000 mg | ORAL_TABLET | Freq: Four times a day (QID) | ORAL | Status: DC | PRN
  Administered 2018-08-12 – 2018-08-16 (×4): 650 mg via ORAL
  Filled 2018-08-12 (×4): qty 2

## 2018-08-12 MED ORDER — MEGESTROL ACETATE 400 MG/10ML PO SUSP
300.0000 mg | Freq: Two times a day (BID) | ORAL | Status: DC
  Administered 2018-08-12 – 2018-08-16 (×7): 300 mg via ORAL
  Filled 2018-08-12 (×11): qty 10

## 2018-08-12 MED ORDER — SENNOSIDES-DOCUSATE SODIUM 8.6-50 MG PO TABS: 1.0000 | ORAL_TABLET | Freq: Every evening | ORAL | Status: DC | PRN

## 2018-08-12 MED ORDER — ONDANSETRON HCL 4 MG/2ML IJ SOLN: 4.0000 mg | Freq: Four times a day (QID) | INTRAMUSCULAR | Status: DC | PRN

## 2018-08-12 MED ORDER — MORPHINE SULFATE (PF) 4 MG/ML IV SOLN
4.0000 mg | Freq: Once | INTRAVENOUS | Status: AC
  Administered 2018-08-12: 4 mg via INTRAVENOUS
  Filled 2018-08-12: qty 1

## 2018-08-12 MED ORDER — FERROUS SULFATE 325 (65 FE) MG PO TABS
325.0000 mg | ORAL_TABLET | Freq: Two times a day (BID) | ORAL | Status: DC
  Administered 2018-08-12 – 2018-08-16 (×6): 325 mg via ORAL
  Filled 2018-08-12 (×8): qty 1

## 2018-08-12 MED ORDER — CITALOPRAM HYDROBROMIDE 20 MG PO TABS
40.0000 mg | ORAL_TABLET | Freq: Every day | ORAL | Status: DC
  Administered 2018-08-12 – 2018-08-16 (×4): 40 mg via ORAL
  Filled 2018-08-12 (×4): qty 2

## 2018-08-12 MED ORDER — PROCHLORPERAZINE EDISYLATE 10 MG/2ML IJ SOLN
5.0000 mg | Freq: Once | INTRAMUSCULAR | Status: AC
  Administered 2018-08-12: 5 mg via INTRAVENOUS
  Filled 2018-08-12: qty 2

## 2018-08-12 MED ORDER — PROCHLORPERAZINE EDISYLATE 10 MG/2ML IJ SOLN
5.0000 mg | Freq: Four times a day (QID) | INTRAMUSCULAR | Status: DC | PRN
  Filled 2018-08-12: qty 1

## 2018-08-12 MED ORDER — MORPHINE SULFATE (PF) 2 MG/ML IV SOLN
2.0000 mg | INTRAVENOUS | Status: DC | PRN
  Administered 2018-08-13: 4 mg via INTRAVENOUS
  Administered 2018-08-13: 2 mg via INTRAVENOUS
  Administered 2018-08-14: 4 mg via INTRAVENOUS
  Administered 2018-08-14: 2 mg via INTRAVENOUS
  Administered 2018-08-14: 4 mg via INTRAVENOUS
  Administered 2018-08-15 (×2): 2 mg via INTRAVENOUS
  Filled 2018-08-12: qty 2
  Filled 2018-08-12 (×2): qty 1
  Filled 2018-08-12: qty 2
  Filled 2018-08-12: qty 1
  Filled 2018-08-12: qty 2
  Filled 2018-08-12: qty 1

## 2018-08-12 MED ORDER — MOMETASONE FURO-FORMOTEROL FUM 200-5 MCG/ACT IN AERO
2.0000 | INHALATION_SPRAY | Freq: Two times a day (BID) | RESPIRATORY_TRACT | Status: DC
  Administered 2018-08-12 – 2018-08-16 (×7): 2 via RESPIRATORY_TRACT
  Filled 2018-08-12 (×2): qty 8.8

## 2018-08-12 MED ORDER — SODIUM CHLORIDE 0.9 % IV BOLUS
1000.0000 mL | Freq: Once | INTRAVENOUS | Status: AC
  Administered 2018-08-12: 1000 mL via INTRAVENOUS

## 2018-08-12 MED ORDER — APIXABAN 2.5 MG PO TABS
2.5000 mg | ORAL_TABLET | Freq: Two times a day (BID) | ORAL | Status: DC
  Filled 2018-08-12: qty 1

## 2018-08-12 MED ORDER — ONDANSETRON HCL 4 MG PO TABS: 4.0000 mg | ORAL_TABLET | Freq: Four times a day (QID) | ORAL | Status: DC | PRN

## 2018-08-12 MED ORDER — APIXABAN 5 MG PO TABS
5.0000 mg | ORAL_TABLET | Freq: Two times a day (BID) | ORAL | Status: DC
  Administered 2018-08-12: 5 mg via ORAL
  Filled 2018-08-12: qty 1

## 2018-08-12 MED ORDER — ACETAMINOPHEN 650 MG RE SUPP: 650.0000 mg | Freq: Four times a day (QID) | RECTAL | Status: DC | PRN

## 2018-08-12 MED ORDER — BISACODYL 5 MG PO TBEC
5.0000 mg | DELAYED_RELEASE_TABLET | Freq: Every day | ORAL | Status: DC | PRN
  Filled 2018-08-12: qty 1

## 2018-08-12 MED ORDER — SODIUM CHLORIDE 0.9 % IV SOLN
INTRAVENOUS | Status: AC
  Administered 2018-08-12: 07:00:00 via INTRAVENOUS

## 2018-08-12 MED ORDER — ALBUTEROL SULFATE (2.5 MG/3ML) 0.083% IN NEBU: 2.5000 mg | INHALATION_SOLUTION | Freq: Four times a day (QID) | RESPIRATORY_TRACT | Status: DC | PRN

## 2018-08-12 NOTE — ED Provider Notes (Signed)
[facility] Emergency Department Provider Note  ____________________________________________   First MD Initiated Contact with Patient 08/12/18 [phone]     (approximate)  I have reviewed the triage vital signs and the nursing notes.   HISTORY  Chief Complaint Abdominal Pain   HPI Danielle [name] is a 62 y.o. female who comes to the emergency department with abdominal pain, nausea, and discomfort around her ileostomy.   She has a complex past medical history including remote breast cancer, congestive heart failure, COPD, and ileostomy.  She was recently admitted to our hospital about 3 weeks ago with a bowel obstruction.  At that time it was recommended that she have her ileostomy dilated however the patient declined and she was discharged home.  She is been in her usual state of health until about a day or 2 ago at which point her abdominal pain recurred.  It is been gradual onset slowly progressive is now moderate severity cramping.  She feels like there is decreased output from her ileostomy.  Her symptoms are nonradiating.  She has been nauseated and has vomited several times.     Past Medical History:  Diagnosis Date  . Anxiety   . Arthritis   . Cancer ([place]) left    breast cancer 2000, chemo tx's with total mastectomy and lymph nodes resected.   . Cancer of right lung ([place]) 02/21/2016   rad tx's.   . CHF (congestive heart failure) ([place])   . COPD (chronic obstructive pulmonary disease) ([place])   . Dependence on supplemental oxygen   . Depression   . Diabetes mellitus without complication ([place])   . Heart murmur   . Hypertension   . Lung nodule   . Lymphedema   . Shortness of breath dyspnea    with exertion  . Status post chemotherapy 2001   left breast cancer  . Status post radiation therapy 2001   left breast cancer    Patient Active Problem List   Diagnosis Date Noted  . Abdominal pain 08/12/2018  . Reflux esophagitis   . Iron deficiency anemia  secondary to blood loss (chronic)   . SBO (small bowel obstruction) ([place]) 06/29/2018  . Chronic systolic heart failure ([place]) 06/09/2018  . HTN (hypertension) 06/09/2018  . Atrial fibrillation ([place]) 06/09/2018  . Lymphedema 06/09/2018  . ARF (acute renal failure) ([place]) 06/04/2018  . Protein-calorie malnutrition, severe 06/04/2018  . Blood in stool   . Focal (segmental) acute (reversible) ischemia of large intestine ([place])   . Ulceration of intestine   . Abdominal pain, right upper quadrant   . Diverticulosis of large intestine without diverticulitis   . Abnormal CT scan, colon   . Generalized abdominal pain   . Colitis   . COPD exacerbation ([place])   . Malnutrition of moderate degree 04/23/2018  . Palliative care by specialist   . DNR (do not resuscitate) discussion   . Weakness generalized   . Hyponatremia 02/10/2017  . Syncope 02/09/2017  . Lung nodule, solitary 07/25/2016  . Malignant neoplasm of upper lobe of right lung ([place]) 07/25/2016  . Carcinoma of overlapping sites of left breast in female, estrogen receptor positive ([place]) 06/22/2016  . Multiple lung nodules 06/22/2016  . Cough   . Lesion of right lung   . Breast cancer in female [facility]) 02/21/2016  . DOE (dyspnea on exertion) 02/14/2016  . Moderate COPD (chronic obstructive pulmonary disease) ([place]) 08/24/2014    Past Surgical History:  Procedure Laterality Date  . Breast Biospy  Left    [facility]  . BREAST SURGERY    . COLONOSCOPY N/A 04/30/2018   Procedure: COLONOSCOPY;  Surgeon: Virgel Manifold, MD;  Location: [facility] ENDOSCOPY;  Service: Endoscopy;  Laterality: N/A;  . COLONOSCOPY N/A 07/22/2018   Procedure: COLONOSCOPY;  Surgeon: Virgel Manifold, MD;  Location: [facility] ENDOSCOPY;  Service: Endoscopy;  Laterality: N/A;  . DILATION AND CURETTAGE OF UTERUS    . ELECTROMAGNETIC NAVIGATION BROCHOSCOPY Right 04/11/2016   Procedure: ELECTROMAGNETIC NAVIGATION BRONCHOSCOPY;  Surgeon: Vilinda Boehringer, MD;  Location: [facility] ORS;   Service: Cardiopulmonary;  Laterality: Right;  . ESOPHAGOGASTRODUODENOSCOPY N/A 07/22/2018   Procedure: ESOPHAGOGASTRODUODENOSCOPY (EGD);  Surgeon: Virgel Manifold, MD;  Location: [facility] ENDOSCOPY;  Service: Endoscopy;  Laterality: N/A;  . ESOPHAGOGASTRODUODENOSCOPY (EGD) WITH PROPOFOL N/A 05/07/2018   Procedure: ESOPHAGOGASTRODUODENOSCOPY (EGD) WITH PROPOFOL;  Surgeon: Lucilla Lame, MD;  Location: [facility] ENDOSCOPY;  Service: Endoscopy;  Laterality: N/A;  . history of colonoscopy]    . ILEOSCOPY N/A 07/22/2018   Procedure: ILEOSCOPY THROUGH STOMA;  Surgeon: Virgel Manifold, MD;  Location: [facility] ENDOSCOPY;  Service: Endoscopy;  Laterality: N/A;  . ILEOSTOMY    . LAPAROTOMY Right 05/04/2018   Procedure: EXPLORATORY LAPAROTOMY right colectomy right and left ostomy;  Surgeon: Herbert Pun, MD;  Location: [facility] ORS;  Service: General;  Laterality: Right;  . LUNG BIOPSY    . MASTECTOMY Left    2000, [facility]  . ROTATOR CUFF REPAIR Right    [facility]    Prior to Admission medications   Medication Sig Start Date End Date Taking? Authorizing Provider  albuterol (PROVENTIL HFA;VENTOLIN HFA) 108 (90 Base) MCG/ACT inhaler Inhale 2 puffs into the lungs every 6 (six) hours as needed for wheezing or shortness of breath. 04/24/18  Yes Demetrios Loll, MD  apixaban (ELIQUIS) 5 MG TABS tablet Take 1 tablet (5 mg total) by mouth 2 (two) times daily. 06/23/18  Yes [name], MD  budesonide-formoterol (SYMBICORT) 160-4.5 MCG/ACT inhaler Inhale 2 puffs into the lungs 2 (two) times daily. 04/24/18  Yes Demetrios Loll, MD  carvedilol (COREG) 6.25 MG tablet Take 1 tablet (6.25 mg total) by mouth 2 (two) times daily. 06/24/18  Yes Hackney, Tina A, FNP  citalopram (CELEXA) 40 MG tablet Take 40 mg by mouth daily.   Yes [provider]  ferrous sulfate 325 (65 FE) MG tablet Take 1 tablet (325 mg total) by mouth 2 (two) times daily with a meal. 07/02/18  Yes [name], MD  furosemide (LASIX) 20 MG  tablet Take 20 mg by mouth daily.    Yes [provider]  HYDROcodone-acetaminophen (NORCO/VICODIN) 5-325 MG tablet Take 1 tablet by mouth every 6 (six) hours as needed for moderate pain or severe pain.   Yes [provider]  megestrol (MEGACE) 400 MG/10ML suspension Take 7.5 mLs (300 mg total) by mouth 2 (two) times daily. 06/05/18  Yes Fritzi Mandes, MD  Multiple Vitamin (MULTIVITAMIN WITH MINERALS) TABS tablet Take 1 tablet by mouth daily. 06/06/18  Yes Fritzi Mandes, MD  pantoprazole (PROTONIX) 40 MG tablet Take 1 tablet (40 mg total) by mouth 2 (two) times daily before a meal. 07/23/18  Yes Sudini, Alveta Heimlich, MD  traMADol (ULTRAM) 50 MG tablet Take 50 mg by mouth every 6 (six) hours as needed. for pain 07/28/18  Yes [provider]    Allergies Patient has no known allergies.  Family History  Problem Relation Age of Onset  . Breast cancer Mother 107  . Cancer Mother  Breast   . Cirrhosis Father   . Breast cancer Paternal Aunt 66  . Cancer Maternal Aunt        Breast     Social History Social History   Tobacco Use  . Smoking status: Former Smoker    Packs/day: 0.50    Years: 20.00    Pack years: 10.00    Types: Cigarettes    Last attempt to quit: 07/02/2012    Years since quitting: 6.1  . Smokeless tobacco: Never Used  . Tobacco comment: quit 2014  Substance Use Topics  . Alcohol use: Yes    Comment: Occasionally  . Drug use: No    Review of Systems Constitutional: No fever/chills Eyes: No visual changes. ENT: No sore throat. Cardiovascular: Denies chest pain. Respiratory: Denies shortness of breath. Gastrointestinal: Positive for abdominal pain.  Positive for nausea, positive for vomiting.  No diarrhea.  No constipation. Genitourinary: Negative for dysuria. Musculoskeletal: Negative for back pain. Skin: Negative for rash. Neurological: Positive for headache   ____________________________________________   PHYSICAL EXAM:  VITAL  SIGNS: ED Triage Vitals [08/12/18 0221]  Enc Vitals Group     BP 116/77     Pulse Rate 82     Resp 18     Temp 97.8 F (36.6 C)     Temp Source Oral     SpO2 100 %     Weight      Height      Head Circumference      Peak Flow      Pain Score 9     Pain Loc      Pain Edu?      Excl. in [place]?     Constitutional: Alert and oriented x4 appears obviously uncomfortable although nontoxic no diaphoresis Eyes: PERRL EOMI. Head: Atraumatic. Nose: No congestion/rhinnorhea. Mouth/Throat: No trismus Neck: No stridor.   Cardiovascular: Normal rate, regular rhythm. Grossly normal heart sounds.  Good peripheral circulation. Respiratory: Normal respiratory effort.  No retractions. Lungs CTAB and moving good air Gastrointestinal: Soft abdomen diffuse mild tenderness with no rebound or guarding no peritonitis.  Ileostomy on the right and colostomy on the left are both present and patent pink and productive Musculoskeletal: No lower extremity edema   Neurologic:  Normal speech and language. No gross focal neurologic deficits are appreciated. Skin:  Skin is warm, dry and intact. No rash noted. Psychiatric: Mood and affect are normal. Speech and behavior are normal.    ____________________________________________   DIFFERENTIAL includes but not limited to  Small bowel obstruction, partial small bowel obstruction, dehydration, volvulus, metabolic derangement ____________________________________________   LABS (all labs ordered are listed, but only abnormal results are displayed)  Labs Reviewed  LIPASE, BLOOD - Abnormal; Notable for the following components:      Result Value   Lipase 54 (*)    All other components within normal limits  COMPREHENSIVE METABOLIC PANEL - Abnormal; Notable for the following components:   Sodium 130 (*)    CO2 20 (*)    BUN 51 (*)    Creatinine, Ser 2.35 (*)    GFR calc non Af Amer 21 (*)    GFR calc Af Amer 24 (*)    All other components within normal  limits  CBC - Abnormal; Notable for the following components:   RBC 3.19 (*)    Hemoglobin 9.4 (*)    HCT 29.2 (*)    All other components within normal limits  MAGNESIUM  PHOSPHORUS  NA AND K (  SODIUM & POTASSIUM), RAND UR  CREATININE, URINE, RANDOM  PROTEIN, URINE, RANDOM  UREA NITROGEN, URINE    Lab work reviewed by me shows significant acute kidney injury and suggestive of dehydration.  Increased lipase likely secondary to vomiting __________________________________________  EKG   ____________________________________________  RADIOLOGY  CT abdomen pelvis noncontrast reviewed by me shows proximal small bowel if equalization and concerning for partial small bowel obstruction ____________________________________________   PROCEDURES  Procedure(s) performed: no  Procedures  Critical Care performed: no  ____________________________________________   INITIAL IMPRESSION / ASSESSMENT AND PLAN / ED COURSE  Pertinent labs & imaging results that were available during my care of the patient were reviewed by me and considered in my medical decision making (see chart for details).   As part of my medical decision making, I reviewed the following data within the [place] History obtained from family if available, nursing notes, old chart and ekg, as well as notes from prior ED visits.  The patient has abdominal pain tenderness nausea and vomiting along with decreased ileostomy output.  Lab work shows profound acute kidney injury and dehydration.  Her CT is suggestive of partial small bowel obstruction.  At this point she requires inpatient admission for aggressive IV fluid hydration, surgical consultation, and management of her small bowel obstruction.  The patient and her daughter verbalized understanding and agreement with the plan.  For the patient's pain administered 4 mg of IV morphine and 5 mg of IV Compazine as the patient is reporting a gradual onset not  maximal onset bifrontal throbbing headache similar to previous headaches.  Zofran tends to exacerbate headaches and Compazine is extremely effective in treating them.  Her pain was significantly improved following the administration of these medications.  I then spoke with the hospitalist who has graciously agreed to admit the patient to his service.  There is no indication for emergent surgical consultation and they can consult in the morning.      ____________________________________________   FINAL CLINICAL IMPRESSION(S) / ED DIAGNOSES  Final diagnoses:  Partial small bowel obstruction (HCC)  Acute kidney injury ([place])      NEW MEDICATIONS STARTED DURING THIS VISIT:  New Prescriptions   No medications on file     Note:  This document was prepared using Dragon voice recognition software and may include unintentional dictation errors.     Darel Hong, MD 08/12/18 [phone]

## 2018-08-12 NOTE — ED Notes (Signed)
Pt in with co right sided abd burning states around her colostomy. States she has hx of obstruction in the past, no vomiting but has had nausea. Pt with 2nd ostomy bag noted to left abd states "it is to drain mucous". Whitish fluid noted to ostomy to left abd and loose stool noted to colostomy.

## 2018-08-12 NOTE — ED Notes (Signed)
Pt resting quietly in room at this time, Pt waiting placement in hospital room.  Pt talking on the phone at this time.  Denies any needs. Pt remains NPO at this time.

## 2018-08-12 NOTE — Progress Notes (Signed)
Patient admitted overnight with partial SBO. She is not currently having any abdominal pain. Pain medicines are helping  On exam, she is resting comfortably. Ileostomy and colostomy bags in place without any output. Her abdomen is diffusely tender.  -Agree with admission H&P -Continue IVFs and IV pain meds -Surgery consulted  Hyman Bible, MD

## 2018-08-12 NOTE — ED Triage Notes (Signed)
Patient reports having abdominal pain especially at ostomy site.  States last week had problems with stool coming out of ostomy, but resolved.  States ostomy site itself look different.

## 2018-08-12 NOTE — Consult Note (Signed)
SURGICAL CONSULTATION NOTE   HISTORY OF PRESENT ILLNESS (HPI):  62 y.o. female presented to [facility] ED for evaluation of abdominal pain, nausea and vomiting. Patient reports that this started 2 days ago. The patient refers nausea and vomiting. This has been intermittent. This is associated with decrease oral intake. She refers that the ileostomy output increase and decrease. Denies fever or chills.  Upon evaluation at ED CT scan was done showing small bowel dilation suggesting small bowel obstruction. I personally reviewed the images. She was also found with increased creatinine. Normal hemoglobin.  Surgery is consulted by Dr. Jodell Cipro in this context for evaluation and management of small bowel obstruction.  PAST MEDICAL HISTORY (PMH):  Past Medical History:  Diagnosis Date  . Anxiety   . Arthritis   . Cancer ([place]) left    breast cancer 2000, chemo tx's with total mastectomy and lymph nodes resected.   . Cancer of right lung ([place]) 02/21/2016   rad tx's.   . CHF (congestive heart failure) ([place])   . COPD (chronic obstructive pulmonary disease) ([place])   . Dependence on supplemental oxygen   . Depression   . Diabetes mellitus without complication ([place])   . Heart murmur   . Hypertension   . Lung nodule   . Lymphedema   . Shortness of breath dyspnea    with exertion  . Status post chemotherapy 2001   left breast cancer  . Status post radiation therapy 2001   left breast cancer     PAST SURGICAL HISTORY ([place]):  Past Surgical History:  Procedure Laterality Date  . Breast Biospy Left    [facility]  . BREAST SURGERY    . COLONOSCOPY N/A 04/30/2018   Procedure: COLONOSCOPY;  Surgeon: Virgel Manifold, MD;  Location: [facility] ENDOSCOPY;  Service: Endoscopy;  Laterality: N/A;  . COLONOSCOPY N/A 07/22/2018   Procedure: COLONOSCOPY;  Surgeon: Virgel Manifold, MD;  Location: [facility] ENDOSCOPY;  Service: Endoscopy;  Laterality: N/A;  . DILATION AND CURETTAGE OF UTERUS    . ELECTROMAGNETIC  NAVIGATION BROCHOSCOPY Right 04/11/2016   Procedure: ELECTROMAGNETIC NAVIGATION BRONCHOSCOPY;  Surgeon: Vilinda Boehringer, MD;  Location: [facility] ORS;  Service: Cardiopulmonary;  Laterality: Right;  . ESOPHAGOGASTRODUODENOSCOPY N/A 07/22/2018   Procedure: ESOPHAGOGASTRODUODENOSCOPY (EGD);  Surgeon: Virgel Manifold, MD;  Location: [facility] ENDOSCOPY;  Service: Endoscopy;  Laterality: N/A;  . ESOPHAGOGASTRODUODENOSCOPY (EGD) WITH PROPOFOL N/A 05/07/2018   Procedure: ESOPHAGOGASTRODUODENOSCOPY (EGD) WITH PROPOFOL;  Surgeon: Lucilla Lame, MD;  Location: [facility] ENDOSCOPY;  Service: Endoscopy;  Laterality: N/A;  . history of colonoscopy]    . ILEOSCOPY N/A 07/22/2018   Procedure: ILEOSCOPY THROUGH STOMA;  Surgeon: Virgel Manifold, MD;  Location: [facility] ENDOSCOPY;  Service: Endoscopy;  Laterality: N/A;  . ILEOSTOMY    . LAPAROTOMY Right 05/04/2018   Procedure: EXPLORATORY LAPAROTOMY right colectomy right and left ostomy;  Surgeon: Herbert Pun, MD;  Location: [facility] ORS;  Service: General;  Laterality: Right;  . LUNG BIOPSY    . MASTECTOMY Left    2000, [facility]  . ROTATOR CUFF REPAIR Right    [facility]     MEDICATIONS:  Prior to Admission medications   Medication Sig Start Date End Date Taking? Authorizing Provider  albuterol (PROVENTIL HFA;VENTOLIN HFA) 108 (90 Base) MCG/ACT inhaler Inhale 2 puffs into the lungs every 6 (six) hours as needed for wheezing or shortness of breath. 04/24/18  Yes Demetrios Loll, MD  apixaban (ELIQUIS) 5 MG TABS tablet Take 1 tablet (5 mg total) by mouth 2 (two) times daily. 06/23/18  Yes [name], MD  budesonide-formoterol [facility]) 160-4.5 MCG/ACT inhaler Inhale 2 puffs into the lungs 2 (two) times daily. 04/24/18  Yes Demetrios Loll, MD  carvedilol (COREG) 6.25 MG tablet Take 1 tablet (6.25 mg total) by mouth 2 (two) times daily. 06/24/18  Yes Hackney, Tina A, FNP  citalopram (CELEXA) 40 MG tablet Take 40 mg by mouth daily.   Yes [provider]  ferrous sulfate  325 (65 FE) MG tablet Take 1 tablet (325 mg total) by mouth 2 (two) times daily with a meal. 07/02/18  Yes [name], MD  furosemide (LASIX) 20 MG tablet Take 20 mg by mouth daily.    Yes [provider]  HYDROcodone-acetaminophen (NORCO/VICODIN) 5-325 MG tablet Take 1 tablet by mouth every 6 (six) hours as needed for moderate pain or severe pain.   Yes [provider]  megestrol (MEGACE) 400 MG/10ML suspension Take 7.5 mLs (300 mg total) by mouth 2 (two) times daily. 06/05/18  Yes Fritzi Mandes, MD  Multiple Vitamin (MULTIVITAMIN WITH MINERALS) TABS tablet Take 1 tablet by mouth daily. 06/06/18  Yes Fritzi Mandes, MD  pantoprazole (PROTONIX) 40 MG tablet Take 1 tablet (40 mg total) by mouth 2 (two) times daily before a meal. 07/23/18  Yes Sudini, Alveta Heimlich, MD  traMADol (ULTRAM) 50 MG tablet Take 50 mg by mouth every 6 (six) hours as needed. for pain 07/28/18  Yes [provider]     ALLERGIES:  No Known Allergies   SOCIAL HISTORY:  Social History   Socioeconomic History  . Marital status: Divorced    Spouse name: Not on file  . Number of children: Not on file  . Years of education: Not on file  . Highest education level: Not on file  Occupational History  . Not on file  Social Needs  . Financial resource strain: Hard  . Food insecurity:    Worry: Often true    Inability: Often true  . Transportation needs:    Medical: No    Non-medical: No  Tobacco Use  . Smoking status: Former Smoker    Packs/day: 0.50    Years: 20.00    Pack years: 10.00    Types: Cigarettes    Last attempt to quit: 07/02/2012    Years since quitting: 6.1  . Smokeless tobacco: Never Used  . Tobacco comment: quit 2014  Substance and Sexual Activity  . Alcohol use: Yes    Comment: Occasionally  . Drug use: No  . Sexual activity: Never  Lifestyle  . Physical activity:    Days per week: 2 days    Minutes per session: 30 min  . Stress: Not at all  Relationships  . Social  connections:    Talks on phone: More than three times a week    Gets together: More than three times a week    Attends religious service: 1 to 4 times per year    Active member of club or organization: No    Attends meetings of clubs or organizations: Never    Relationship status: Divorced  . Intimate partner violence:    Fear of current or ex partner: No    Emotionally abused: No    Physically abused: No    Forced sexual activity: No  Other Topics Concern  . Not on file  Social History Narrative  . Not on file    The patient currently resides (home / rehab facility / nursing home): Home The patient normally is (ambulatory / bedbound):  Ambulatory   FAMILY HISTORY:  Family History  Problem Relation Age of Onset  . Breast cancer Mother 67  . Cancer Mother        Breast   . Cirrhosis Father   . Breast cancer Paternal Aunt 69  . Cancer Maternal Aunt        Breast      REVIEW OF SYSTEMS:  Constitutional: denies weight loss, fever, chills, or sweats  Eyes: denies any other vision changes, history of eye injury  ENT: denies sore throat, hearing problems  Respiratory: denies shortness of breath, wheezing  Cardiovascular: denies chest pain, palpitations  Gastrointestinal: positive abdominal pain, N/V  Genitourinary: denies burning with urination or urinary frequency Musculoskeletal: denies any other joint pains or cramps  Skin: denies any other rashes or skin discolorations  Neurological: denies any other headache, dizziness, weakness  Psychiatric: denies any other depression, anxiety   All other review of systems were negative   VITAL SIGNS:  Temp:  [97.8 F (36.6 C)-98.3 F (36.8 C)] 98.3 F (36.8 C) (10/29 2007) Pulse Rate:  [70-83] 78 (10/29 2007) Resp:  [16-20] 20 (10/29 2007) BP: (92-134)/(61-88) 126/79 (10/29 2007) SpO2:  [93 %-100 %] 96 % (10/29 2007)             INTAKE/OUTPUT:  This shift: No intake/output data recorded.  Last 2 shifts: @IOLAST2SHIFTS @    PHYSICAL EXAM:  Constitutional:  -- Normal body habitus  -- Awake, alert, and oriented x3  Eyes:  -- Pupils equally round and reactive to light  -- No scleral icterus  Ear, nose, and throat:  -- No jugular venous distension  Pulmonary:  -- Bibasilar rales -- Equal decreased breath sounds bilaterally -- Breathing non-labored at rest Cardiovascular:  -- S1, S2 present  -- No pericardial rubs Gastrointestinal:  -- Abdomen soft, mild tender, non-distended, no guarding or rebound tenderness. Right lower quadrant ileostomy with stricture but with stool on bag.  -- No abdominal masses appreciated, pulsatile or otherwise  Musculoskeletal and Integumentary:  -- Wounds or skin discoloration: None appreciated -- Extremities: B/L UE and LE FROM, hands and feet warm, no edema  Neurologic:  -- Motor function: intact and symmetric -- Sensation: intact and symmetric   Labs:  CBC Latest Ref Rng & Units 08/12/2018 07/21/2018 07/20/2018  WBC 4.0 - 10.5 K/uL 7.1 7.8 9.2  Hemoglobin 12.0 - 15.0 g/dL 9.4(L) 7.4(L) 7.2(L)  Hematocrit 36.0 - 46.0 % 29.2(L) 23.4(L) 22.8(L)  Platelets 150 - 400 K/uL 328 327 327   CMP Latest Ref Rng & Units 08/12/2018 07/20/2018 07/19/2018  Glucose 70 - 99 mg/dL 85 46(L) 74  BUN 8 - 23 mg/dL 51(H) 11 21  Creatinine 0.44 - 1.00 mg/dL 2.35(H) 0.80 1.02(H)  Sodium 135 - 145 mmol/L 130(L) 135 134(L)  Potassium 3.5 - 5.1 mmol/L 4.6 3.6 3.9  Chloride 98 - 111 mmol/L 103 110 109  CO2 22 - 32 mmol/L 20(L) 18(L) 19(L)  Calcium 8.9 - 10.3 mg/dL 9.2 8.0(L) 8.2(L)  Total Protein 6.5 - 8.1 g/dL 7.7 5.3(L) -  Total Bilirubin 0.3 - 1.2 mg/dL 0.5 0.9 -  Alkaline Phos 38 - 126 U/L 82 42 -  AST 15 - 41 U/L 26 15 -  ALT 0 - 44 U/L 13 8 -    Imaging studies:  EXAM: CT ABDOMEN AND PELVIS WITHOUT CONTRAST  TECHNIQUE: Multidetector CT imaging of the abdomen and pelvis was performed following the standard protocol without IV contrast.  COMPARISON:  07/17/2018 CT abdomen and  pelvis  FINDINGS: Lower chest: Calcific atherosclerosis of the coronary arteries.  Hepatobiliary: Stable subcentimeter level cysts in segment 4A and 5. No additional focal liver lesion. Normal gallbladder. No biliary ductal dilatation.  Pancreas: Unremarkable. No pancreatic ductal dilatation or surrounding inflammatory changes.  Spleen: Normal in size without focal abnormality.  Adrenals/Urinary Tract: Adrenal glands are unremarkable. Kidneys are normal, without renal calculi, focal lesion, or hydronephrosis. Bladder is unremarkable.  Stomach/Bowel: Left lower quadrant diverting colostomy. Right lower quadrant ileostomy. There is mild dilatation of the small bowel upstream to the right lower quadrant ostomy site with fecalization of stool contents. No bowel wall thickening or inflammatory changes. No findings of perforation or abscess.  Vascular/Lymphatic: Aortic atherosclerosis. No enlarged abdominal or pelvic lymph nodes.  Reproductive: Stable small uterus fundal fibroma. Uterus and bilateral adnexa are otherwise unremarkable.  Other: No abdominal wall hernia or abnormality. No abdominopelvic ascites.  Musculoskeletal: No acute fracture is seen. Stable mild lower lumbar compression deformity with transitional lumbosacral anatomy.  IMPRESSION: 1. Mild dilatation of the small bowel upstream to the right lower quadrant ostomy site with fecalization of stool contents probably reflecting dysmotility or partial obstruction. No inflammatory changes. 2. Stable chronic changes as above.   Electronically Signed   By: Kristine Garbe M.D.   On: 08/12/2018 04:10  Assessment/Plan:  62 y.o. 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 Patient has been presenting with intermittent small bowel obstruction. In multiple occasion it has been discussed with patient and with daughter about possible alternative of surgical managements. The most simple alternatives is a trial of dilation of the ileostomy. There is a superficial stricture of the ileostomy that can be tried to be dilated. If successful, this can assess if the problem is due to the superficial stricture. If the ileostomy is able to be dilated and patient continue to present with bowel obstruction, then the problem is deeper in the abdomen. I consider that this can be an initial first step since may be done in the OR with sedation and the patient might not required general anesthesia.  I am also concerned that this is not the problem and might not be successful. If the patient has a stricture of the skin that does not expand spontaneously, does not make sense that the patient present with these intermittent episodes of SBO as the stricture is permanent and should not improve spontaneously as she is always doing in all episodes, that she comes and resolves in few days. Still the dilation of the ileostomy may be a first not invasive therapy to help the patient. Patient and the daughter wants to avoid a major surgery since the last surgery was very difficult for the patient to recover due to her severe chronic medical condition.  I talked to the patient today about the dilation and she agreed. I will talk to the daughter too since she is the one who take care of the patient.  If Internal medicine agrees, I will hold Eliquis for 2 days before the procedure.    Arnold Long, MD

## 2018-08-12 NOTE — ED Notes (Signed)
meds given per md order, hospitalist in to admit.

## 2018-08-12 NOTE — ED Notes (Signed)
Pt given supplies to empty ostomy, pt states she does not require any assistance and is independent with changing ostomy.

## 2018-08-12 NOTE — ED Notes (Signed)
Pt co headache med given

## 2018-08-12 NOTE — H&P (View-Only) (Signed)
SURGICAL CONSULTATION NOTE   HISTORY OF PRESENT ILLNESS (HPI):  62 y.o. female presented to [facility] ED for evaluation of abdominal pain, nausea and vomiting. Patient reports that this started 2 days ago. The patient refers nausea and vomiting. This has been intermittent. This is associated with decrease oral intake. She refers that the ileostomy output increase and decrease. Denies fever or chills.  Upon evaluation at ED CT scan was done showing small bowel dilation suggesting small bowel obstruction. I personally reviewed the images. She was also found with increased creatinine. Normal hemoglobin.  Surgery is consulted by Dr. Jodell Cipro in this context for evaluation and management of small bowel obstruction.  PAST MEDICAL HISTORY (PMH):  Past Medical History:  Diagnosis Date  . Anxiety   . Arthritis   . Cancer ([place]) left    breast cancer 2000, chemo tx's with total mastectomy and lymph nodes resected.   . Cancer of right lung ([place]) 02/21/2016   rad tx's.   . CHF (congestive heart failure) ([place])   . COPD (chronic obstructive pulmonary disease) ([place])   . Dependence on supplemental oxygen   . Depression   . Diabetes mellitus without complication ([place])   . Heart murmur   . Hypertension   . Lung nodule   . Lymphedema   . Shortness of breath dyspnea    with exertion  . Status post chemotherapy 2001   left breast cancer  . Status post radiation therapy 2001   left breast cancer     PAST SURGICAL HISTORY ([place]):  Past Surgical History:  Procedure Laterality Date  . Breast Biospy Left    [facility]  . BREAST SURGERY    . COLONOSCOPY N/A 04/30/2018   Procedure: COLONOSCOPY;  Surgeon: Virgel Manifold, MD;  Location: [facility] ENDOSCOPY;  Service: Endoscopy;  Laterality: N/A;  . COLONOSCOPY N/A 07/22/2018   Procedure: COLONOSCOPY;  Surgeon: Virgel Manifold, MD;  Location: [facility] ENDOSCOPY;  Service: Endoscopy;  Laterality: N/A;  . DILATION AND CURETTAGE OF UTERUS    . ELECTROMAGNETIC  NAVIGATION BROCHOSCOPY Right 04/11/2016   Procedure: ELECTROMAGNETIC NAVIGATION BRONCHOSCOPY;  Surgeon: Vilinda Boehringer, MD;  Location: [facility] ORS;  Service: Cardiopulmonary;  Laterality: Right;  . ESOPHAGOGASTRODUODENOSCOPY N/A 07/22/2018   Procedure: ESOPHAGOGASTRODUODENOSCOPY (EGD);  Surgeon: Virgel Manifold, MD;  Location[facility] ENDOSCOPY;  Service: Endoscopy;  Laterality: N/A;  . ESOPHAGOGASTRODUODENOSCOPY (EGD) WITH PROPOFOL N/A 05/07/2018   Procedure: ESOPHAGOGASTRODUODENOSCOPY (EGD) WITH PROPOFOL;  Surgeon: Lucilla Lame, MD;  Location: [facility] ENDOSCOPY;  Service: Endoscopy;  Laterality: N/A;  . history of colonoscopy]    . ILEOSCOPY N/A 07/22/2018   Procedure: ILEOSCOPY THROUGH STOMA;  Surgeon: Virgel Manifold, MD;  Location: [facility] ENDOSCOPY;  Service: Endoscopy;  Laterality: N/A;  . ILEOSTOMY    . LAPAROTOMY Right 05/04/2018   Procedure: EXPLORATORY LAPAROTOMY right colectomy right and left ostomy;  Surgeon: Herbert Pun, MD;  Location: [facility] ORS;  Service: General;  Laterality: Right;  . LUNG BIOPSY    . MASTECTOMY Left    2000, [facility]  . ROTATOR CUFF REPAIR Right    [facility]     MEDICATIONS:  Prior to Admission medications   Medication Sig Start Date End Date Taking? Authorizing Provider  albuterol (PROVENTIL HFA;VENTOLIN HFA) 108 (90 Base) MCG/ACT inhaler Inhale 2 puffs into the lungs every 6 (six) hours as needed for wheezing or shortness of breath. 04/24/18  Yes Demetrios Loll, MD  apixaban (ELIQUIS) 5 MG TABS tablet Take 1 tablet (5 mg total) by mouth 2 (two) times daily. 06/23/18  Yes [name], MD  budesonide-formoterol [facility]) 160-4.5 MCG/ACT inhaler Inhale 2 puffs into the lungs 2 (two) times daily. 04/24/18  Yes Demetrios Loll, MD  carvedilol (COREG) 6.25 MG tablet Take 1 tablet (6.25 mg total) by mouth 2 (two) times daily. 06/24/18  Yes Hackney, Tina A, FNP  citalopram (CELEXA) 40 MG tablet Take 40 mg by mouth daily.   Yes [provider]  ferrous sulfate  325 (65 FE) MG tablet Take 1 tablet (325 mg total) by mouth 2 (two) times daily with a meal. 07/02/18  Yes [name], MD  furosemide (LASIX) 20 MG tablet Take 20 mg by mouth daily.    Yes [provider]  HYDROcodone-acetaminophen (NORCO/VICODIN) 5-325 MG tablet Take 1 tablet by mouth every 6 (six) hours as needed for moderate pain or severe pain.   Yes [provider]  megestrol (MEGACE) 400 MG/10ML suspension Take 7.5 mLs (300 mg total) by mouth 2 (two) times daily. 06/05/18  Yes Fritzi Mandes, MD  Multiple Vitamin (MULTIVITAMIN WITH MINERALS) TABS tablet Take 1 tablet by mouth daily. 06/06/18  Yes Fritzi Mandes, MD  pantoprazole (PROTONIX) 40 MG tablet Take 1 tablet (40 mg total) by mouth 2 (two) times daily before a meal. 07/23/18  Yes Sudini, Alveta Heimlich, MD  traMADol (ULTRAM) 50 MG tablet Take 50 mg by mouth every 6 (six) hours as needed. for pain 07/28/18  Yes [provider]     ALLERGIES:  No Known Allergies   SOCIAL HISTORY:  Social History   Socioeconomic History  . Marital status: Divorced    Spouse name: Not on file  . Number of children: Not on file  . Years of education: Not on file  . Highest education level: Not on file  Occupational History  . Not on file  Social Needs  . Financial resource strain: Hard  . Food insecurity:    Worry: Often true    Inability: Often true  . Transportation needs:    Medical: No    Non-medical: No  Tobacco Use  . Smoking status: Former Smoker    Packs/day: 0.50    Years: 20.00    Pack years: 10.00    Types: Cigarettes    Last attempt to quit: 07/02/2012    Years since quitting: 6.1  . Smokeless tobacco: Never Used  . Tobacco comment: quit 2014  Substance and Sexual Activity  . Alcohol use: Yes    Comment: Occasionally  . Drug use: No  . Sexual activity: Never  Lifestyle  . Physical activity:    Days per week: 2 days    Minutes per session: 30 min  . Stress: Not at all  Relationships  . Social  connections:    Talks on phone: More than three times a week    Gets together: More than three times a week    Attends religious service: 1 to 4 times per year    Active member of club or organization: No    Attends meetings of clubs or organizations: Never    Relationship status: Divorced  . Intimate partner violence:    Fear of current or ex partner: No    Emotionally abused: No    Physically abused: No    Forced sexual activity: No  Other Topics Concern  . Not on file  Social History Narrative  . Not on file    The patient currently resides (home / rehab facility / nursing home): Home The patient normally is (ambulatory / bedbound):  Ambulatory   FAMILY HISTORY:  Family History  Problem Relation Age of Onset  . Breast cancer Mother 59  . Cancer Mother        Breast   . Cirrhosis Father   . Breast cancer Paternal Aunt 29  . Cancer Maternal Aunt        Breast      REVIEW OF SYSTEMS:  Constitutional: denies weight loss, fever, chills, or sweats  Eyes: denies any other vision changes, history of eye injury  ENT: denies sore throat, hearing problems  Respiratory: denies shortness of breath, wheezing  Cardiovascular: denies chest pain, palpitations  Gastrointestinal: positive abdominal pain, N/V  Genitourinary: denies burning with urination or urinary frequency Musculoskeletal: denies any other joint pains or cramps  Skin: denies any other rashes or skin discolorations  Neurological: denies any other headache, dizziness, weakness  Psychiatric: denies any other depression, anxiety   All other review of systems were negative   VITAL SIGNS:  Temp:  [97.8 F (36.6 C)-98.3 F (36.8 C)] 98.3 F (36.8 C) (10/29 2007) Pulse Rate:  [70-83] 78 (10/29 2007) Resp:  [16-20] 20 (10/29 2007) BP: (92-134)/(61-88) 126/79 (10/29 2007) SpO2:  [93 %-100 %] 96 % (10/29 2007)             INTAKE/OUTPUT:  This shift: No intake/output data recorded.  Last 2 shifts: @IOLAST2SHIFTS @    PHYSICAL EXAM:  Constitutional:  -- Normal body habitus  -- Awake, alert, and oriented x3  Eyes:  -- Pupils equally round and reactive to light  -- No scleral icterus  Ear, nose, and throat:  -- No jugular venous distension  Pulmonary:  -- Bibasilar rales -- Equal decreased breath sounds bilaterally -- Breathing non-labored at rest Cardiovascular:  -- S1, S2 present  -- No pericardial rubs Gastrointestinal:  -- Abdomen soft, mild tender, non-distended, no guarding or rebound tenderness. Right lower quadrant ileostomy with stricture but with stool on bag.  -- No abdominal masses appreciated, pulsatile or otherwise  Musculoskeletal and Integumentary:  -- Wounds or skin discoloration: None appreciated -- Extremities: B/L UE and LE FROM, hands and feet warm, no edema  Neurologic:  -- Motor function: intact and symmetric -- Sensation: intact and symmetric   Labs:  CBC Latest Ref Rng & Units 08/12/2018 07/21/2018 07/20/2018  WBC 4.0 - 10.5 K/uL 7.1 7.8 9.2  Hemoglobin 12.0 - 15.0 g/dL 9.4(L) 7.4(L) 7.2(L)  Hematocrit 36.0 - 46.0 % 29.2(L) 23.4(L) 22.8(L)  Platelets 150 - 400 K/uL 328 327 327   CMP Latest Ref Rng & Units 08/12/2018 07/20/2018 07/19/2018  Glucose 70 - 99 mg/dL 85 46(L) 74  BUN 8 - 23 mg/dL 51(H) 11 21  Creatinine 0.44 - 1.00 mg/dL 2.35(H) 0.80 1.02(H)  Sodium 135 - 145 mmol/L 130(L) 135 134(L)  Potassium 3.5 - 5.1 mmol/L 4.6 3.6 3.9  Chloride 98 - 111 mmol/L 103 110 109  CO2 22 - 32 mmol/L 20(L) 18(L) 19(L)  Calcium 8.9 - 10.3 mg/dL 9.2 8.0(L) 8.2(L)  Total Protein 6.5 - 8.1 g/dL 7.7 5.3(L) -  Total Bilirubin 0.3 - 1.2 mg/dL 0.5 0.9 -  Alkaline Phos 38 - 126 U/L 82 42 -  AST 15 - 41 U/L 26 15 -  ALT 0 - 44 U/L 13 8 -    Imaging studies:  EXAM: CT ABDOMEN AND PELVIS WITHOUT CONTRAST  TECHNIQUE: Multidetector CT imaging of the abdomen and pelvis was performed following the standard protocol without IV contrast.  COMPARISON:  07/17/2018 CT abdomen and  pelvis  FINDINGS: Lower chest: Calcific atherosclerosis of the coronary arteries.  Hepatobiliary: Stable subcentimeter level cysts in segment 4A and 5. No additional focal liver lesion. Normal gallbladder. No biliary ductal dilatation.  Pancreas: Unremarkable. No pancreatic ductal dilatation or surrounding inflammatory changes.  Spleen: Normal in size without focal abnormality.  Adrenals/Urinary Tract: Adrenal glands are unremarkable. Kidneys are normal, without renal calculi, focal lesion, or hydronephrosis. Bladder is unremarkable.  Stomach/Bowel: Left lower quadrant diverting colostomy. Right lower quadrant ileostomy. There is mild dilatation of the small bowel upstream to the right lower quadrant ostomy site with fecalization of stool contents. No bowel wall thickening or inflammatory changes. No findings of perforation or abscess.  Vascular/Lymphatic: Aortic atherosclerosis. No enlarged abdominal or pelvic lymph nodes.  Reproductive: Stable small uterus fundal fibroma. Uterus and bilateral adnexa are otherwise unremarkable.  Other: No abdominal wall hernia or abnormality. No abdominopelvic ascites.  Musculoskeletal: No acute fracture is seen. Stable mild lower lumbar compression deformity with transitional lumbosacral anatomy.  IMPRESSION: 1. Mild dilatation of the small bowel upstream to the right lower quadrant ostomy site with fecalization of stool contents probably reflecting dysmotility or partial obstruction. No inflammatory changes. 2. Stable chronic changes as above.   Electronically Signed   By: Kristine Garbe M.D.   On: 08/12/2018 04:10  Assessment/Plan:  62 y.o. 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 Patient has been presenting with intermittent small bowel obstruction. In multiple occasion it has been discussed with patient and with daughter about possible alternative of surgical managements. The most simple alternatives is a trial of dilation of the ileostomy. There is a superficial stricture of the ileostomy that can be tried to be dilated. If successful, this can assess if the problem is due to the superficial stricture. If the ileostomy is able to be dilated and patient continue to present with bowel obstruction, then the problem is deeper in the abdomen. I consider that this can be an initial first step since may be done in the OR with sedation and the patient might not required general anesthesia.  I am also concerned that this is not the problem and might not be successful. If the patient has a stricture of the skin that does not expand spontaneously, does not make sense that the patient present with these intermittent episodes of SBO as the stricture is permanent and should not improve spontaneously as she is always doing in all episodes, that she comes and resolves in few days. Still the dilation of the ileostomy may be a first not invasive therapy to help the patient. Patient and the daughter wants to avoid a major surgery since the last surgery was very difficult for the patient to recover due to her severe chronic medical condition.  I talked to the patient today about the dilation and she agreed. I will talk to the daughter too since she is the one who take care of the patient.  If Internal medicine agrees, I will hold Eliquis for 2 days before the procedure.    Arnold Long, MD

## 2018-08-12 NOTE — H&P (Addendum)
[facility] NAME: Danielle [name]    MR#:  [id number]  DATE OF BIRTH:  [birth year]  DATE OF ADMISSION:  08/12/2018  PRIMARY CARE PHYSICIAN: Elisabeth Cara, NP   REQUESTING/REFERRING PHYSICIAN: Darel Hong, MD  CHIEF COMPLAINT:   Chief Complaint  Patient presents with  . Abdominal Pain    HISTORY OF PRESENT ILLNESS:  Danielle [name]  is a 62 y.o. female with a known history of T2NIDDM, HTN, HLD, COPD (2L [place] O2 qHS/PRN), Hx L breast Ca [Stage IIIA, T3N1M0, ER/PR (+), s/p MRM + ALND + chemo + XRT + Tamoxifen], Hx RML lung adenoCa (acinar + papillary, s/p XRT), Hx RUL NSCLC (Stage I, squamous, s/p SBRT), Hx LBIG (2/2 ischemic hepatic flexure bleeding ulcer, s/p R hemicolectomy + ileostomy; 05/04/2018) p/w abdominal pain. Pt has a complex Hx, and has had multiple hospital admissions in the last 3 months (including 07/08, 08/21, 09/15, 09/21 and 10/04), for a variety of reasons. She was most recently admitted on 07/18/2018 for partial SBO and AKI/dehydration. She was followed by General Surgery in consultation, but was managed conservatively (w/ NGT decompression) by the Hospitalist service, and was ultimately discharged home on 07/23/2018. There was some concern for ileostomy stricture (vs. adhesions) as the cause for pt's SBO, w/ Dr. Dahlia Byes (who was consulting at the time) offering to perform a trial of ileostomy dilation. The pt refused, however, preferring to avoid the procedure if possible, and went home.  On present admission, she tells me that she has been feeling poorly for ~2wks. She endorses abdominal pain at baseline, which she states is mild, and comes & goes, but states she has had a more constant R-sided AP for the past two weeks, characterized as a pressure/squeezing sensation, which she states radiates from the RUQ abdomen to the R back under the R scapula. She states that her stoma has changed in appearance, though she has  difficulty describing in what way the appearance has changed, and is only able to tell me it "looks different". She states ostomy output has decreased. She endorses a pulling sensation in the R abdomen and back when she lies back (suggestive of adhesion). She endorses nausea x1d, (-) vomiting (though she states she vomited 2x sometime last week). She endorses lightheadedness, fatigue/malaise/generalized weakness, decreased appetite, poor PO intake and dehydration. She is in mild distress (2/2 AP) at the time of my examination. She endorses improvement in pain w/ morphine. CT A/P report reads, "Mild dilatation of the small bowel upstream to the right lower quadrant ostomy site with fecalization of stool contents probably reflecting dysmotility or partial obstruction." Na+ 130, Cr 2.35 (increased from 0.8 on 07/20/2018), BUN/Cr ratio ~21.7. Vitals stable.  PAST MEDICAL HISTORY:   Past Medical History:  Diagnosis Date  . Anxiety   . Arthritis   . Cancer ([place]) left    breast cancer 2000, chemo tx's with total mastectomy and lymph nodes resected.   . Cancer of right lung ([place]) 02/21/2016   rad tx's.   . CHF (congestive heart failure) ([place])   . COPD (chronic obstructive pulmonary disease) ([place])   . Dependence on supplemental oxygen   . Depression   . Diabetes mellitus without complication ([place])   . Heart murmur   . Hypertension   . Lung nodule   . Lymphedema   . Shortness of breath dyspnea    with exertion  . Status post chemotherapy 2001   left breast cancer  .  Status post radiation therapy 2001   left breast cancer    PAST SURGICAL HISTORY:   Past Surgical History:  Procedure Laterality Date  . Breast Biospy Left    [facility]  . BREAST SURGERY    . COLONOSCOPY N/A 04/30/2018   Procedure: COLONOSCOPY;  Surgeon: Virgel Manifold, MD;  Location: [facility] ENDOSCOPY;  Service: Endoscopy;  Laterality: N/A;  . COLONOSCOPY N/A 07/22/2018   Procedure: COLONOSCOPY;  Surgeon: Virgel Manifold, MD;   Location: [facility] ENDOSCOPY;  Service: Endoscopy;  Laterality: N/A;  . DILATION AND CURETTAGE OF UTERUS    . ELECTROMAGNETIC NAVIGATION BROCHOSCOPY Right 04/11/2016   Procedure: ELECTROMAGNETIC NAVIGATION BRONCHOSCOPY;  Surgeon: Vilinda Boehringer, MD;  Location: [facility] ORS;  Service: Cardiopulmonary;  Laterality: Right;  . ESOPHAGOGASTRODUODENOSCOPY N/A 07/22/2018   Procedure: ESOPHAGOGASTRODUODENOSCOPY (EGD);  Surgeon: Virgel Manifold, MD;  Location: [facility] ENDOSCOPY;  Service: Endoscopy;  Laterality: N/A;  . ESOPHAGOGASTRODUODENOSCOPY (EGD) WITH PROPOFOL N/A 05/07/2018   Procedure: ESOPHAGOGASTRODUODENOSCOPY (EGD) WITH PROPOFOL;  Surgeon: Lucilla Lame, MD;  Location: [facility] ENDOSCOPY;  Service: Endoscopy;  Laterality: N/A;  . history of colonoscopy]    . ILEOSCOPY N/A 07/22/2018   Procedure: ILEOSCOPY THROUGH STOMA;  Surgeon: Virgel Manifold, MD;  Location: [facility] ENDOSCOPY;  Service: Endoscopy;  Laterality: N/A;  . ILEOSTOMY    . LAPAROTOMY Right 05/04/2018   Procedure: EXPLORATORY LAPAROTOMY right colectomy right and left ostomy;  Surgeon: Herbert Pun, MD;  Location: [facility] ORS;  Service: General;  Laterality: Right;  . LUNG BIOPSY    . MASTECTOMY Left    2000, [facility]  . ROTATOR CUFF REPAIR Right    [facility]    SOCIAL HISTORY:   Social History   Tobacco Use  . Smoking status: Former Smoker    Packs/day: 0.50    Years: 20.00    Pack years: 10.00    Types: Cigarettes    Last attempt to quit: 07/02/2012    Years since quitting: 6.1  . Smokeless tobacco: Never Used  . Tobacco comment: quit 2014  Substance Use Topics  . Alcohol use: Yes    Comment: Occasionally    FAMILY HISTORY:   Family History  Problem Relation Age of Onset  . Breast cancer Mother 24  . Cancer Mother        Breast   . Cirrhosis Father   . Breast cancer Paternal Aunt 67  . Cancer Maternal Aunt        Breast     DRUG ALLERGIES:  No Known Allergies  REVIEW OF SYSTEMS:   Review of Systems    Constitutional: Positive for malaise/fatigue. Negative for chills, diaphoresis, fever and weight loss.  HENT: Negative for congestion, ear pain, hearing loss, nosebleeds, sinus pain, sore throat and tinnitus.   Eyes: Negative for blurred vision, double vision and photophobia.  Respiratory: Negative for cough, hemoptysis, sputum production, shortness of breath and wheezing.   Cardiovascular: Negative for chest pain, palpitations, orthopnea, claudication, leg swelling and PND.  Gastrointestinal: Positive for abdominal pain and nausea. Negative for blood in stool, constipation, diarrhea, heartburn, melena and vomiting.  Genitourinary: Positive for flank pain. Negative for dysuria, frequency, hematuria and urgency.  Musculoskeletal: Positive for back pain. Negative for joint pain, myalgias and neck pain.  Skin: Negative for itching and rash.  Neurological: Positive for dizziness and weakness. Negative for tingling, tremors, sensory change, speech change, focal weakness, seizures, loss of consciousness and headaches.  Psychiatric/Behavioral: Negative for memory loss. The patient does not have insomnia.    As per  HPI.   MEDICATIONS AT HOME:   Prior to Admission medications   Medication Sig Start Date End Date Taking? Authorizing Provider  albuterol (PROVENTIL HFA;VENTOLIN HFA) 108 (90 Base) MCG/ACT inhaler Inhale 2 puffs into the lungs every 6 (six) hours as needed for wheezing or shortness of breath. 04/24/18  Yes Demetrios Loll, MD  apixaban (ELIQUIS) 5 MG TABS tablet Take 1 tablet (5 mg total) by mouth 2 (two) times daily. 06/23/18  Yes [name], MD  budesonide-formoterol (SYMBICORT) 160-4.5 MCG/ACT inhaler Inhale 2 puffs into the lungs 2 (two) times daily. 04/24/18  Yes Demetrios Loll, MD  carvedilol (COREG) 6.25 MG tablet Take 1 tablet (6.25 mg total) by mouth 2 (two) times daily. 06/24/18  Yes Hackney, Tina A, FNP  citalopram (CELEXA) 40 MG tablet Take 40 mg by mouth daily.   Yes [provider]  ferrous sulfate 325 (65 FE) MG tablet Take 1 tablet (325 mg total) by mouth 2 (two) times daily with a meal. 07/02/18  Yes [name], MD  furosemide (LASIX) 20 MG tablet Take 20 mg by mouth daily.    Yes [provider]  HYDROcodone-acetaminophen (NORCO/VICODIN) 5-325 MG tablet Take 1 tablet by mouth every 6 (six) hours as needed for moderate pain or severe pain.   Yes [provider]  megestrol (MEGACE) 400 MG/10ML suspension Take 7.5 mLs (300 mg total) by mouth 2 (two) times daily. 06/05/18  Yes Fritzi Mandes, MD  Multiple Vitamin (MULTIVITAMIN WITH MINERALS) TABS tablet Take 1 tablet by mouth daily. 06/06/18  Yes Fritzi Mandes, MD  pantoprazole (PROTONIX) 40 MG tablet Take 1 tablet (40 mg total) by mouth 2 (two) times daily before a meal. 07/23/18  Yes Sudini, Alveta Heimlich, MD  traMADol (ULTRAM) 50 MG tablet Take 50 mg by mouth every 6 (six) hours as needed. for pain 07/28/18  Yes [provider]      VITAL SIGNS:  Blood pressure 125/77, pulse 72, temperature 97.8 F (36.6 C), temperature source Oral, resp. rate 18, SpO2 99 %.  PHYSICAL EXAMINATION:  Physical Exam  Constitutional: She is oriented to person, place, and time. She appears well-developed. She appears cachectic. She is active and cooperative.  Non-toxic appearance. She has a sickly appearance. She appears ill. She appears distressed. She is not intubated. Nasal cannula in place.  HENT:  Head: Atraumatic.  Mouth/Throat: Oropharynx is clear and moist. No oropharyngeal exudate.  Eyes: Conjunctivae, EOM and lids are normal. No scleral icterus.  Cardiovascular: Normal rate, regular rhythm, S1 normal, S2 normal and normal heart sounds.  No extrasystoles are present. Exam reveals no gallop, no S3, no S4, no distant heart sounds and no friction rub.  No murmur heard. Pulmonary/Chest: Effort normal. No accessory muscle usage or stridor. No apnea, no tachypnea and no bradypnea. She is not  intubated. No respiratory distress. She has decreased breath sounds in the right upper field, the right middle field, the right lower field, the left upper field, the left middle field and the left lower field. She has no wheezes. She has no rhonchi. She has rales in the right middle field and the right lower field. She exhibits no tenderness.  Abdominal: Soft. She exhibits no distension. Bowel sounds are decreased. There is generalized tenderness and tenderness in the right upper quadrant. There is no rigidity, no rebound and no guarding.  Neurological: She is alert and oriented to person, place, and time. She is not disoriented.  Skin: Skin is warm, dry and intact. No rash  noted. She is not diaphoretic. No erythema.  Psychiatric: She has a normal mood and affect. Her speech is normal and behavior is normal. Judgment and thought content normal. Cognition and memory are normal.   RN told me pt was irritable w/ her, but she seems pleasant to me. Ostomy sites appear healthy, (-) blood/pus, (-) obvious stomal/peristomal infxn.  LABORATORY PANEL:   CBC Recent Labs  Lab 08/12/18 0345  WBC 7.1  HGB 9.4*  HCT 29.2*  PLT 328   ------------------------------------------------------------------------------------------------------------------  Chemistries  Recent Labs  Lab 08/12/18 0345  NA 130*  K 4.6  CL 103  CO2 20*  GLUCOSE 85  BUN 51*  CREATININE 2.35*  CALCIUM 9.2  MG 2.2  AST 26  ALT 13  ALKPHOS 82  BILITOT 0.5   ------------------------------------------------------------------------------------------------------------------  Cardiac Enzymes No results for input(s): TROPONINI in the last 168 hours. ------------------------------------------------------------------------------------------------------------------  RADIOLOGY:  Ct Abdomen Pelvis Wo Contrast  Result Date: 08/12/2018 CLINICAL DATA:  62 y/o  F; abdominal pain at ostomy site. EXAM: CT ABDOMEN AND PELVIS WITHOUT  CONTRAST TECHNIQUE: Multidetector CT imaging of the abdomen and pelvis was performed following the standard protocol without IV contrast. COMPARISON:  07/17/2018 CT abdomen and pelvis FINDINGS: Lower chest: Calcific atherosclerosis of the coronary arteries. Hepatobiliary: Stable subcentimeter level cysts in segment 4A and 5. No additional focal liver lesion. Normal gallbladder. No biliary ductal dilatation. Pancreas: Unremarkable. No pancreatic ductal dilatation or surrounding inflammatory changes. Spleen: Normal in size without focal abnormality. Adrenals/Urinary Tract: Adrenal glands are unremarkable. Kidneys are normal, without renal calculi, focal lesion, or hydronephrosis. Bladder is unremarkable. Stomach/Bowel: Left lower quadrant diverting colostomy. Right lower quadrant ileostomy. There is mild dilatation of the small bowel upstream to the right lower quadrant ostomy site with fecalization of stool contents. No bowel wall thickening or inflammatory changes. No findings of perforation or abscess. Vascular/Lymphatic: Aortic atherosclerosis. No enlarged abdominal or pelvic lymph nodes. Reproductive: Stable small uterus fundal fibroma. Uterus and bilateral adnexa are otherwise unremarkable. Other: No abdominal wall hernia or abnormality. No abdominopelvic ascites. Musculoskeletal: No acute fracture is seen. Stable mild lower lumbar compression deformity with transitional lumbosacral anatomy. IMPRESSION: 1. Mild dilatation of the small bowel upstream to the right lower quadrant ostomy site with fecalization of stool contents probably reflecting dysmotility or partial obstruction. No inflammatory changes. 2. Stable chronic changes as above. Electronically Signed   By: Kristine Garbe M.D.   On: 08/12/2018 04:10   IMPRESSION AND PLAN:   A/P: 11H w/ complicated PMHx, including recent Hx LBIG in 04/2018 (2/2 ischemic hepatic flexure bleeding ulcer, s/p R hemicolectomy + ileostomy; 05/04/2018) p/w AP/N w/  recurrent partial SBO, AKI + dehydration + hypovolemic hyponatremia. Lipase elevation, normocytic anemia. -AP/N, recurrent partial SBO: Presents w/ R AP + nausea + poor intake x2wks, as per HPI. CT A/P report reads, "Mild dilatation of the small bowel upstream to the right lower quadrant ostomy site with fecalization of stool contents probably reflecting dysmotility or partial obstruction." Pt is not actively vomiting, and has not had any vomiting immediately prior to hospitalization. As such, NGT was not placed. NPO, ADAT. General Surgery consulted. Dr. Dahlia Byes offered trial of ileostomy dilation on prior visit, but pt refused. It may be prudent to reevaluate the need for this procedure. Per Dr. Corlis Leak note from 07/23/2018 (the date of discharge from her prior hospitalization), recurrent SBO s/p ileostomy dilation would imply adhesions to be the more likely culprit. IVF, pain ctrl, antiemetics. -AKI, dehydration, hypovolemic hyponatremia: Na+ 130. Cr  2.35 (increased from 0.8 on 07/20/2018). BUN/Cr ratio ~21.7, prerenal AKI. Likely 2/2 dehydration, poor PO intake, diuretics use. CT A/P (-) obstructive nephropathy/uropathy. Urine studies (electrolytes, protein, creatinine, urea nitrogen) pending. Hold Lasix. IVF. Monitor BMP, avoid nephrotoxins. -Lipase elevation: Likely 2/2 nausea, AP, dehydration. CT A/P (-) "pancreatic ductal dilatation or surrounding inflammatory changes." IVF as above. -Normocytic anemia:  Hgb 9.4 on present admission, increased from 7.4 on prior admission. Increase likely 2/2 intravascular volume depletion w/ hemoconcentration. No evidence of acute blood loss. -c/w other home meds/formulary subs as tolerated. -FEN/GI: NPO, advance diet as tolerated (renal diabetic diet). -DVT PPx: Eliquis. -Code status: Full code. -Disposition: Admission, > 2 midnights.   All the records are reviewed and case discussed with ED provider. Management plans discussed with the patient, family and they  are in agreement.  CODE STATUS: Full code.  TOTAL TIME TAKING CARE OF THIS PATIENT: 75 minutes.    Arta Silence M.D on 08/12/2018 at 7:49 AM  Between 7am to 6pm - Pager - [phone]  After 6pm go to [email or web address] - Proofreader  [facility]  Office  [phone]  CC: Primary care physician; Elisabeth Cara, NP   Note: This dictation was prepared with Dragon dictation along with smaller phrase technology. Any transcriptional errors that result from this process are unintentional.

## 2018-08-13 LAB — CBC
HCT: 26.7 % — ABNORMAL LOW (ref 36.0–46.0)
Hemoglobin: 8.5 g/dL — ABNORMAL LOW (ref 12.0–15.0)
MCH: 29.3 pg (ref 26.0–34.0)
MCHC: 31.8 g/dL (ref 30.0–36.0)
MCV: 92.1 fL (ref 80.0–100.0)
PLATELETS: 293 10*3/uL (ref 150–400)
RBC: 2.9 MIL/uL — ABNORMAL LOW (ref 3.87–5.11)
RDW: 13.5 % (ref 11.5–15.5)
WBC: 6.2 10*3/uL (ref 4.0–10.5)
nRBC: 0 % (ref 0.0–0.2)

## 2018-08-13 LAB — BASIC METABOLIC PANEL
Anion gap: 5 (ref 5–15)
BUN: 34 mg/dL — AB (ref 8–23)
CALCIUM: 8.9 mg/dL (ref 8.9–10.3)
CO2: 20 mmol/L — AB (ref 22–32)
Chloride: 113 mmol/L — ABNORMAL HIGH (ref 98–111)
Creatinine, Ser: 1.33 mg/dL — ABNORMAL HIGH (ref 0.44–1.00)
GFR calc Af Amer: 48 mL/min — ABNORMAL LOW (ref >60)
GFR, EST NON AFRICAN AMERICAN: 41 mL/min — AB (ref >60)
GLUCOSE: 73 mg/dL (ref 70–99)
Potassium: 4.2 mmol/L (ref 3.5–5.1)
Sodium: 138 mmol/L (ref 135–145)

## 2018-08-13 LAB — UREA NITROGEN, URINE: UREA NITROGEN UR: 485 mg/dL

## 2018-08-13 MED ORDER — HEPARIN SODIUM (PORCINE) 5000 UNIT/ML IJ SOLN
5000.0000 [IU] | Freq: Three times a day (TID) | INTRAMUSCULAR | Status: DC
  Administered 2018-08-13 – 2018-08-15 (×5): 5000 [IU] via SUBCUTANEOUS
  Filled 2018-08-13 (×5): qty 1

## 2018-08-13 NOTE — Progress Notes (Signed)
[facility] NAME: Danielle [name]    MR#:  [id number]  DATE OF BIRTH:  25-Dec-1955  SUBJECTIVE:  States her abdominal pain has improved since admission.  Still having some mild pain.  No nausea, no vomiting.  Has had some ostomy output.  REVIEW OF SYSTEMS:  Review of Systems  Constitutional: Negative for chills and fever.  HENT: Negative for sinus pain and sore throat.   Eyes: Negative for blurred vision and double vision.  Respiratory: Negative for cough and shortness of breath.   Cardiovascular: Negative for chest pain and palpitations.  Gastrointestinal: Positive for abdominal pain. Negative for nausea and vomiting.  Genitourinary: Negative for dysuria and frequency.  Musculoskeletal: Negative for back pain and neck pain.  Neurological: Negative for dizziness and headaches.  Psychiatric/Behavioral: Negative for depression. The patient is not nervous/anxious.     DRUG ALLERGIES:  No Known Allergies VITALS:  Blood pressure 92/72, pulse 88, temperature 97.7 F (36.5 C), temperature source Oral, resp. rate (!) 22, height 5\' 7"  (1.702 m), weight 54.7 kg, SpO2 100 %. PHYSICAL EXAMINATION:  Physical Exam  Constitutional:  Sitting up on the edge of the bed, in no acute distress HEENT: Normocephalic, atraumatic, EOMI, no scleral icterus, moist mucous membranes Neck: Supple, full range of motion Cardiovascular: RRR, no murmurs, rubs, or gallops Respiratory: CTAB, no wheezing, normal work of breathing Gastrointestinal: +soft, non-distended. +moderate tenderness in the RUQ, Ileostomy on the right and colostomy on the left are both present and patent Musculoskeletal: No lower extremity edema   Neurologic:  Normal speech and language. No gross focal neurologic deficits are appreciated. Skin:  Skin is warm, dry and intact. No rash noted. Psychiatric: Mood and affect are normal. Speech and behavior are normal. LABORATORY PANEL:   Female CBC Recent Labs  Lab 08/13/18 0504  WBC 6.2  HGB 8.5*  HCT 26.7*  PLT 293   ------------------------------------------------------------------------------------------------------------------ Chemistries  Recent Labs  Lab 08/12/18 0345 08/13/18 0504  NA 130* 138  K 4.6 4.2  CL 103 113*  CO2 20* 20*  GLUCOSE 85 73  BUN 51* 34*  CREATININE 2.35* 1.33*  CALCIUM 9.2 8.9  MG 2.2  --   AST 26  --   ALT 13  --   ALKPHOS 82  --   BILITOT 0.5  --    RADIOLOGY:  No results found. ASSESSMENT AND PLAN:   Recurrent partial SBO-patient is s/p right hemicolectomy with end ileostomy.  Abdominal pain has improved. -Surgery following-plan for dilation of the ileostomy after holding Eliquis for 2 days, ?possibly tomorrow -IV pain meds and antiemetics -No indication for NG tube at this time  Acute renal failure-likely due to dehydration. Cr improved 2.35 > 1.33. -Holding Lasix -Avoid nephrotoxic agents -Recheck BMP in the morning  Paroxysmal atrial fibrillation-in normal sinus rhythm here -Okay to hold Eliquis for surgery -Continue Coreg  Chronic systolic congestive heart failure-stable, no signs of volume overload -Continue Coreg -Holding Lasix for now  Normocytic anemia- stable, hemoglobin at baseline -Monitor  Hypertension- BPs soft -Continue Coreg  Depression- stable -Continue Celexa  COPD-stable, no signs of acute exacerbation -Renew home inhalers  DVT prophylaxis- heparin sq  All the records are reviewed and case discussed with Care Management/Social Worker. Management plans discussed with the patient, family and they are in agreement.  CODE STATUS: Full Code  TOTAL TIME TAKING CARE OF THIS PATIENT: 33 minutes.   More than 50% of the time was spent in  counseling/coordination of care: YES  POSSIBLE D/C IN 2-3 DAYS, DEPENDING ON CLINICAL CONDITION.   Berna Spare Tachina Spoonemore M.D on 08/13/2018 at 2:59 PM  Between 7am to 6pm - Pager [phone]  After 6pm  go to [email or web address] - Proofreader  [facility]  Office  [phone]  CC: Primary care physician; Elisabeth Cara, NP  Note: This dictation was prepared with Dragon dictation along with smaller phrase technology. Any transcriptional errors that result from this process are unintentional.

## 2018-08-13 NOTE — Progress Notes (Signed)
Initial Nutrition Assessment  DOCUMENTATION CODES:   Non-severe (moderate) malnutrition in context of chronic illness  INTERVENTION:   RD will monitor for diet advancement vs the need for nutrition support  Pt at high refeeding risk  NUTRITION DIAGNOSIS:   Moderate Malnutrition related to chronic illness(breast and lung cancer, COPD, CHF, multiple admits for recurrent SBO) as evidenced by 7 percent weight loss in 3 weeks, moderate fat depletions, moderate to severe muscle depletions.  GOAL:   Patient will meet greater than or equal to 90% of their needs  MONITOR:   Diet advancement, Labs, Weight trends, Skin, I & O's  REASON FOR ASSESSMENT:   Malnutrition Screening Tool    ASSESSMENT:   62 y.o. female with intermittent episodes of small bowel obstruction  s/p right hemicolectomy with end ileostomy and mucus fistula (05/04/18)for bleeding hepatic flexure, complicated by pertinent comorbidities including CHF, atrial fibrillation on anticoagulation, acute renal failure, COPD, history of lung and breast cancer.   Met with pt in room today. Patient is well known to nutrition department and this RD from multiple previous admits. Pt required TPN 7/19-8/3 and was intubated 7/21-7/25 due to acute respiratory failure following operation. On that admission patient became volume overloaded from TPN; she gained 25 lbs, developed SOB and required a reduced TPN rate and diuresis. She was also noncompliant with nutrition supplements during that admission. She did have improvement of her appetite at discharge, but was still not eating adequately. Pt reports continued decreased appetite and oral intake. Pt reports that she has been drinking Ensure at home but patient prefers butter pecan Nepro. Pt has continued to loose weight since her  surgery. Pt has lost 14lbs(10%) over the past 3 months; this is significant. Pt is currently NPO. Plan is for dilation of the ileostomy per MD note. If there is plans  for additional surgery in the future; would recommend TPN to maximize nutrition prior to surgery. RD will monitor for diet advancement vs the need for nutrition support. Pt likely at high refeeding risk; recommend monitor K, Mg and P labs after initiation of diet or dextrose containing solutions.   Medications reviewed and include: celexa, ferrous sulfate, heparin, megace, protonix  Labs reviewed: K 4.2 wnl, BUN 34(H), creat 1.33(H) P 4.6 wnl, Mg 2.2 wnl- 10/29 Hgb 8.5(L), Hct 26.7(L)  NUTRITION - FOCUSED PHYSICAL EXAM:    Most Recent Value  Orbital Region  Moderate depletion  Upper Arm Region  Mild depletion  Thoracic and Lumbar Region  Moderate depletion  Buccal Region  Moderate depletion  Temple Region  Moderate depletion  Clavicle Bone Region  Moderate depletion  Clavicle and Acromion Bone Region  Moderate depletion  Scapular Bone Region  Mild depletion  Dorsal Hand  Moderate depletion  Patellar Region  Severe depletion  Anterior Thigh Region  Severe depletion  Posterior Calf Region  Severe depletion  Edema (RD Assessment)  None  Hair  Reviewed  Eyes  Reviewed  Mouth  Reviewed  Skin  Reviewed  Nails  Reviewed     Diet Order:   Diet Order            Diet NPO time specified Except for: Ice Chips, Sips with Meds, Other (See Comments)  Diet effective now             EDUCATION NEEDS:   Education needs have been addressed  Skin:  Skin Assessment: Reviewed RN Assessment  Last BM:  10/29- 1 small piece via ileostomy   Height:   Ht  Readings from Last 1 Encounters:  08/12/18 _0  (1.702 m)    Weight:   Wt Readings from Last 1 Encounters:  08/12/18 54.7 kg    Ideal Body Weight:  61.4 kg  BMI:  Body mass index is 18.89 kg/m.  Estimated Nutritional Needs:   Kcal:  1500-1800kcal/day   Protein:  75-85g/day   Fluid:  >1.5L/day   [name] MS, RD, LDN Pager #- [phone] Office#- [phone] After Hours Pager: [phone]

## 2018-08-13 NOTE — Progress Notes (Signed)
[facility] Day(s): 1.   Post op day(s):  [name]   Interval History: Patient seen and examined, no acute events or new complaints overnight. Patient reports continue with pain on right lower quadrant, denies nausea or vomiting.  Vital signs in last 24 hours: [min-max] current  Temp:  [97.7 F (36.5 C)-98.4 F (36.9 C)] 98.1 F (36.7 C) (10/30 2000) Pulse Rate:  [81-95] 89 (10/30 2000) Resp:  [20-22] 20 (10/30 2000) BP: (92-115)/(65-75) 115/74 (10/30 2000) SpO2:  [96 %-100 %] 98 % (10/30 2000)     Height: 5\' 7"  (170.2 cm) Weight: 54.7 kg BMI (Calculated): 18.88   Intake/Output this shift:  No intake/output data recorded.   Intake/Output last 2 shifts:  @IOLAST2SHIFTS @   Physical Exam:  Constitutional: alert, cooperative and no distress  Respiratory: breathing non-labored at rest  Cardiovascular: regular rate and sinus rhythm  Gastrointestinal: soft, non-tender, and non-distended.   Labs:  CBC Latest Ref Rng & Units 08/13/2018 08/12/2018 07/21/2018  WBC 4.0 - 10.5 K/uL 6.2 7.1 7.8  Hemoglobin 12.0 - 15.0 g/dL 8.5(L) 9.4(L) 7.4(L)  Hematocrit 36.0 - 46.0 % 26.7(L) 29.2(L) 23.4(L)  Platelets 150 - 400 K/uL 293 328 327   CMP Latest Ref Rng & Units 08/13/2018 08/12/2018 07/20/2018  Glucose 70 - 99 mg/dL 73 85 46(L)  BUN 8 - 23 mg/dL 34(H) 51(H) 11  Creatinine 0.44 - 1.00 mg/dL 1.33(H) 2.35(H) 0.80  Sodium 135 - 145 mmol/L 138 130(L) 135  Potassium 3.5 - 5.1 mmol/L 4.2 4.6 3.6  Chloride 98 - 111 mmol/L 113(H) 103 110  CO2 22 - 32 mmol/L 20(L) 20(L) 18(L)  Calcium 8.9 - 10.3 mg/dL 8.9 9.2 8.0(L)  Total Protein 6.5 - 8.1 g/dL - 7.7 5.3(L)  Total Bilirubin 0.3 - 1.2 mg/dL - 0.5 0.9  Alkaline Phos 38 - 126 U/L - 82 42  AST 15 - 41 U/L - 26 15  ALT 0 - 44 U/L - 13 8    Imaging studies: No new pertinent imaging studies   Assessment/Plan:  62 y.o.femalewith intermittent episodes of small bowel obstructions/pright hemicolectomy with end ileostomy and  mucus fistula(05/04/18)for bleeding hepatic flexure, complicated by pertinent comorbidities includingCHF, atrial fibrillation on anticoagulation, acute renal failure, COPD, history of lung and breast cancer.  Today patient with improved pain but still present. As per patient she took Eliquis on Tuesday. Eliquis on hold Wednesday and Thursday and procedure scheduled for Friday. Ostomy bag found with stool. Patient with no nausea. Will start clear liquid diet trial. Will follow closely.   Arnold Long, MD

## 2018-08-14 LAB — BASIC METABOLIC PANEL
ANION GAP: 8 (ref 5–15)
BUN: 27 mg/dL — AB (ref 8–23)
CALCIUM: 9.3 mg/dL (ref 8.9–10.3)
CO2: 18 mmol/L — AB (ref 22–32)
CREATININE: 1.34 mg/dL — AB (ref 0.44–1.00)
Chloride: 109 mmol/L (ref 98–111)
GFR calc Af Amer: 47 mL/min — ABNORMAL LOW (ref >60)
GFR, EST NON AFRICAN AMERICAN: 41 mL/min — AB (ref >60)
GLUCOSE: 129 mg/dL — AB (ref 70–99)
POTASSIUM: 3.6 mmol/L (ref 3.5–5.1)
Sodium: 135 mmol/L (ref 135–145)

## 2018-08-14 LAB — CBC
HEMATOCRIT: 28.3 % — AB (ref 36.0–46.0)
Hemoglobin: 8.9 g/dL — ABNORMAL LOW (ref 12.0–15.0)
MCH: 29.3 pg (ref 26.0–34.0)
MCHC: 31.4 g/dL (ref 30.0–36.0)
MCV: 93.1 fL (ref 80.0–100.0)
NRBC: 0 % (ref 0.0–0.2)
Platelets: 315 10*3/uL (ref 150–400)
RBC: 3.04 MIL/uL — AB (ref 3.87–5.11)
RDW: 13.5 % (ref 11.5–15.5)
WBC: 6.6 10*3/uL (ref 4.0–10.5)

## 2018-08-14 LAB — SURGICAL PCR SCREEN
MRSA, PCR: NEGATIVE
Staphylococcus aureus: NEGATIVE

## 2018-08-14 MED ORDER — MUPIROCIN 2 % EX OINT
1.0000 "application " | TOPICAL_OINTMENT | Freq: Two times a day (BID) | CUTANEOUS | Status: DC
  Filled 2018-08-14: qty 22

## 2018-08-14 NOTE — Progress Notes (Signed)
[facility] at [place] NAME: Brinly Maietta    MR#:  [id number]  DATE OF BIRTH:  [birth year]  SUBJECTIVE:  Doing well this morning.  Tolerating clears.  States she is ready for her procedure on Friday.  REVIEW OF SYSTEMS:  Review of Systems  Constitutional: Negative for chills and fever.  HENT: Negative for sinus pain and sore throat.   Eyes: Negative for blurred vision and double vision.  Respiratory: Negative for cough and shortness of breath.   Cardiovascular: Negative for chest pain and palpitations.  Gastrointestinal: Positive for abdominal pain. Negative for nausea and vomiting.  Genitourinary: Negative for dysuria and frequency.  Musculoskeletal: Negative for back pain and neck pain.  Neurological: Negative for dizziness and headaches.  Psychiatric/Behavioral: Negative for depression. The patient is not nervous/anxious.     DRUG ALLERGIES:  No Known Allergies VITALS:  Blood pressure 107/76, pulse 71, temperature (!) 97.4 F (36.3 C), temperature source Oral, resp. rate 19, height 5\' 7"  (1.702 m), weight 54.7 kg, SpO2 100 %. PHYSICAL EXAMINATION:  Physical Exam  Constitutional:  Sitting up on the edge of the bed, in no acute distress HEENT: Normocephalic, atraumatic, EOMI, no scleral icterus, moist mucous membranes Neck: Supple, full range of motion Cardiovascular: RRR, no murmurs, rubs, or gallops Respiratory: CTAB, no wheezing, normal work of breathing Gastrointestinal: +soft, non-distended. +moderate tenderness in the RUQ/epigastric area, Ileostomy on the right and mucous fistula stoma on the left are both patent, liquid brown stool present in ostomy bag Musculoskeletal: No lower extremity edema   Neurologic:  Normal speech and language. No gross focal neurologic deficits are appreciated. Skin:  Skin is warm, dry and intact. No rash noted. Psychiatric: Mood and affect are normal. Speech and behavior are normal. LABORATORY  PANEL:  Female CBC Recent Labs  Lab 08/14/18 0323  WBC 6.6  HGB 8.9*  HCT 28.3*  PLT 315   ------------------------------------------------------------------------------------------------------------------ Chemistries  Recent Labs  Lab 08/12/18 0345  08/14/18 0323  NA 130*   < > 135  K 4.6   < > 3.6  CL 103   < > 109  CO2 20*   < > 18*  GLUCOSE 85   < > 129*  BUN 51*   < > 27*  CREATININE 2.35*   < > 1.34*  CALCIUM 9.2   < > 9.3  MG 2.2  --   --   AST 26  --   --   ALT 13  --   --   ALKPHOS 82  --   --   BILITOT 0.5  --   --    < > = values in this interval not displayed.   RADIOLOGY:  No results found. ASSESSMENT AND PLAN:   Recurrent partial SBO-patient is s/p right hemicolectomy with end ileostomy.  Abdominal pain has improved and patient having bowel movements -Surgery following-plan for dilation of the ileostomy tomorrow  -Holding eliquis -Continue clear liquid diet -IV pain meds and antiemetics -No indication for NG tube at this time  Acute renal failure-likely due to dehydration. Improving.  -Holding Lasix -Avoid nephrotoxic agents -Recheck BMP in the morning  Paroxysmal atrial fibrillation-in normal sinus rhythm here -Holding Eliquis for procedure on Friday -Continue Coreg  Chronic systolic congestive heart failure- stable, no signs of volume overload -Continue Coreg -Holding Lasix for now  Normocytic anemia- stable, hemoglobin at baseline -Monitor  Hypertension- BPs soft -Continue Coreg  Depression- stable -Continue Celexa  COPD-stable, no signs  of acute exacerbation -Renew home inhalers  DVT prophylaxis- heparin sq  All the records are reviewed and case discussed with Care Management/Social Worker. Management plans discussed with the patient, family and they are in agreement.  CODE STATUS: Full Code  TOTAL TIME TAKING CARE OF THIS PATIENT: 33 minutes.   More than 50% of the time was spent in counseling/coordination of care:  YES  POSSIBLE D/C IN 1-2 DAYS, DEPENDING ON CLINICAL CONDITION.   Berna Spare Ruthia Person M.D on 08/14/2018 at 2:52 PM  Between 7am to 6pm - Pager - [phone]  After 6pm go to [email or web address] - Proofreader  [facility]  Office  [phone]  CC: Primary care physician; Elisabeth Cara, NP  Note: This dictation was prepared with Dragon dictation along with smaller phrase technology. Any transcriptional errors that result from this process are unintentional.

## 2018-08-14 NOTE — Consult Note (Signed)
[facility] Nurse ostomy consult note Stoma type/location:  RLQ: functional ileostomy LLQ: mucous fistula Stomal assessment/size:  RLQ, no stoma present, small slit in the patient's skin with a constant trickle of liquid brown/green stool  LLQ, mucous fistula, pink, pale, budded 1" slightly oval   Peristomal assessment:  RLQ, macerated peristomal with stool pooling along the medial aspect of the midline abdominal scar  LLQ, intact  Treatment options for stomal/peristomal skin: using 2" barrier ring that I have formed into a smaller opening and shaped in a convex fashion around the small slit in the patient's skin. Taking caution to have peristomal dry which is a challenge. There is a constant trickle of watery fluid and with pressure there is more of a liquid stool consistency   Added ostomy belt for use, notified patient and bedside nurse I am using belt from a different manufacturer, no belts on this campus in [place].  So it does require manipulation of the attachment of the belt to the pouch.   Output ; in pouchBrown mushy stool from the ileostomy, minimal amounts of mucous noted from the MF Ostomy pouching: 1pc.convex with a 2" barrier ring to the ileostomy/ 1pc flat to the mucous fistula.  When patient visualized abdomen, she reports her stoma was "sticking out" at home. She did report issues with leakage from time to time but also states that "wound be when we ran out of supplies".  CM reports she has no insurance and has no access to any further [facility] services. She was obtaining her ostomy supplies from [facility] but they are not providing them any longer.    [facility] nurse left a VM for Dr. Peyton Najjar on the current status of the ileostomy and provided my cell number for contact if he needs to contact me.    I can offer the indigent program information:  If you will need assistance with free pouches after discharge from the hospital you will need to contact [facility] directly.  Free pouches will only be  supplied to those patients with no type of insurance coverage.   The contact information for the Consumer Assistance Program is [phone], press Option 6 and speak with Maudie Mercury.  This service is only for 3 months and will provide you with limited types of ostomy supplies to meet your needs.  You will have to contact [facility] directly the ostomy nurse cannot assist with this process.   I have followed up in the nursing unit around 1400, pouch is intact at this time.  [facility] Nurse will follow along with you for continued support with ostomy care [place] MSN, RN, [place], [place], [place]

## 2018-08-14 NOTE — Progress Notes (Signed)
[facility] Hospital Day(s): 2.   Post op day(s):  [name]   Interval History: Patient seen and examined, no acute events or new complaints overnight. Patient reports no change on her right lower quadrant abdominal apin, denies nausea and vomiting.  Vital signs in last 24 hours: [min-max] current  Temp:  [97.4 F (36.3 C)-98.2 F (36.8 C)] 97.4 F (36.3 C) (10/31 1229) Pulse Rate:  [71-89] 71 (10/31 1229) Resp:  [19-20] 19 (10/31 0556) BP: (107-115)/(74-77) 107/76 (10/31 1229) SpO2:  [98 %-100 %] 100 % (10/31 1229)     Height: 5\' 7"  (170.2 cm) Weight: 54.7 kg BMI (Calculated): 18.88   Physical Exam:  Constitutional: alert, cooperative and no distress  Respiratory: breathing non-labored at rest  Cardiovascular: regular rate and sinus rhythm  Gastrointestinal: soft, non-tender, and non-distended. Right sided ileostomy.   Labs:  CBC Latest Ref Rng & Units 08/14/2018 08/13/2018 08/12/2018  WBC 4.0 - 10.5 K/uL 6.6 6.2 7.1  Hemoglobin 12.0 - 15.0 g/dL 8.9(L) 8.5(L) 9.4(L)  Hematocrit 36.0 - 46.0 % 28.3(L) 26.7(L) 29.2(L)  Platelets 150 - 400 K/uL 315 293 328   CMP Latest Ref Rng & Units 08/14/2018 08/13/2018 08/12/2018  Glucose 70 - 99 mg/dL 129(H) 73 85  BUN 8 - 23 mg/dL 27(H) 34(H) 51(H)  Creatinine 0.44 - 1.00 mg/dL 1.34(H) 1.33(H) 2.35(H)  Sodium 135 - 145 mmol/L 135 138 130(L)  Potassium 3.5 - 5.1 mmol/L 3.6 4.2 4.6  Chloride 98 - 111 mmol/L 109 113(H) 103  CO2 22 - 32 mmol/L 18(L) 20(L) 20(L)  Calcium 8.9 - 10.3 mg/dL 9.3 8.9 9.2  Total Protein 6.5 - 8.1 g/dL - - 7.7  Total Bilirubin 0.3 - 1.2 mg/dL - - 0.5  Alkaline Phos 38 - 126 U/L - - 82  AST 15 - 41 U/L - - 26  ALT 0 - 44 U/L - - 13   Imaging studies: No new pertinent imaging studies  Assessment/Plan:  62 y.o.femalewithintermittent episodes ofsmall bowel obstructions/pright hemicolectomy with end ileostomy and mucus fistula(05/04/18)for bleeding hepatic flexure, complicated by pertinent  comorbidities includingCHF, atrial fibrillation on anticoagulation, acute renal failure, COPD, history of lung and breast cancer.  Patient evaluated and found stable. Continue having ileostomy output. Again oriented about procedure tomorrow and the goal that is to dilate the ostomy as the least invasive procedure. If this improves her pain, her output and decreases her obstruction will be effective. If this does not improve her symptoms will discuss again with her and her daughter about more complex surgery. At this moment both, the patient and the daughter does not want ileostomy reversal since they known that patient is a high risk and she had a difficult recovery on the original surgery.   Arnold Long, MD

## 2018-08-15 ENCOUNTER — Inpatient Hospital Stay: Payer: Medicaid Other | Admitting: Anesthesiology

## 2018-08-15 ENCOUNTER — Encounter
Admission: EM | Disposition: A | Discharge status: Home / Self Care | Payer: Self-pay | Source: Home / Self Care | Attending: Internal Medicine

## 2018-08-15 ENCOUNTER — Encounter: Payer: Self-pay | Admitting: Certified Registered Nurse Anesthetist

## 2018-08-15 HISTORY — PX: ILEOSTOMY CLOSURE: SHX1784

## 2018-08-15 LAB — BASIC METABOLIC PANEL
ANION GAP: 6 (ref 5–15)
BUN: 17 mg/dL (ref 8–23)
CALCIUM: 9 mg/dL (ref 8.9–10.3)
CO2: 17 mmol/L — ABNORMAL LOW (ref 22–32)
Chloride: 111 mmol/L (ref 98–111)
Creatinine, Ser: 1.14 mg/dL — ABNORMAL HIGH (ref 0.44–1.00)
GFR calc Af Amer: 58 mL/min — ABNORMAL LOW (ref >60)
GFR, EST NON AFRICAN AMERICAN: 50 mL/min — AB (ref >60)
Glucose, Bld: 91 mg/dL (ref 70–99)
Potassium: 4.1 mmol/L (ref 3.5–5.1)
Sodium: 134 mmol/L — ABNORMAL LOW (ref 135–145)

## 2018-08-15 LAB — CBC
HCT: 25.2 % — ABNORMAL LOW (ref 36.0–46.0)
Hemoglobin: 8.2 g/dL — ABNORMAL LOW (ref 12.0–15.0)
MCH: 29.7 pg (ref 26.0–34.0)
MCHC: 32.5 g/dL (ref 30.0–36.0)
MCV: 91.3 fL (ref 80.0–100.0)
NRBC: 0 % (ref 0.0–0.2)
PLATELETS: 277 10*3/uL (ref 150–400)
RBC: 2.76 MIL/uL — ABNORMAL LOW (ref 3.87–5.11)
RDW: 13.2 % (ref 11.5–15.5)
WBC: 5.6 10*3/uL (ref 4.0–10.5)

## 2018-08-15 LAB — HEMOGLOBIN A1C
HEMOGLOBIN A1C: 4.3 % — AB (ref 4.8–5.6)
Mean Plasma Glucose: 76.71 mg/dL

## 2018-08-15 SURGERY — CLOSURE, ILEOSTOMY
Anesthesia: General

## 2018-08-15 MED ORDER — FENTANYL CITRATE (PF) 100 MCG/2ML IJ SOLN
INTRAMUSCULAR | Status: AC
  Filled 2018-08-15: qty 2

## 2018-08-15 MED ORDER — PROPOFOL 500 MG/50ML IV EMUL
INTRAVENOUS | Status: AC
  Filled 2018-08-15: qty 50

## 2018-08-15 MED ORDER — PROPOFOL 500 MG/50ML IV EMUL
INTRAVENOUS | Status: DC | PRN
  Administered 2018-08-15: 75 ug/kg/min via INTRAVENOUS

## 2018-08-15 MED ORDER — LIDOCAINE HCL (CARDIAC) PF 100 MG/5ML IV SOSY
PREFILLED_SYRINGE | INTRAVENOUS | Status: DC | PRN
  Administered 2018-08-15: 50 mg via INTRAVENOUS

## 2018-08-15 MED ORDER — FENTANYL CITRATE (PF) 100 MCG/2ML IJ SOLN
INTRAMUSCULAR | Status: DC | PRN
  Administered 2018-08-15 (×2): 50 ug via INTRAVENOUS

## 2018-08-15 MED ORDER — SODIUM CHLORIDE 0.9 % IV SOLN
INTRAVENOUS | Status: DC | PRN
  Administered 2018-08-15: 07:00:00 via INTRAVENOUS

## 2018-08-15 MED ORDER — MEPERIDINE HCL 50 MG/ML IJ SOLN: 6.2500 mg | INTRAMUSCULAR | Status: DC | PRN

## 2018-08-15 MED ORDER — PROPOFOL 10 MG/ML IV BOLUS
INTRAVENOUS | Status: DC | PRN
  Administered 2018-08-15: 30 mg via INTRAVENOUS
  Administered 2018-08-15: 20 mg via INTRAVENOUS

## 2018-08-15 MED ORDER — FENTANYL CITRATE (PF) 100 MCG/2ML IJ SOLN: 25.0000 ug | INTRAMUSCULAR | Status: DC | PRN

## 2018-08-15 MED ORDER — PHENYLEPHRINE HCL 10 MG/ML IJ SOLN
INTRAMUSCULAR | Status: DC | PRN
  Administered 2018-08-15: 100 ug via INTRAVENOUS

## 2018-08-15 MED ORDER — BUPIVACAINE-EPINEPHRINE (PF) 0.5% -1:200000 IJ SOLN
INTRAMUSCULAR | Status: AC
  Filled 2018-08-15: qty 30

## 2018-08-15 MED ORDER — CHLORHEXIDINE GLUCONATE CLOTH 2 % EX PADS
6.0000 | MEDICATED_PAD | Freq: Every day | CUTANEOUS | Status: DC
  Administered 2018-08-15: 6 via TOPICAL

## 2018-08-15 MED ORDER — APIXABAN 5 MG PO TABS
5.0000 mg | ORAL_TABLET | Freq: Two times a day (BID) | ORAL | Status: DC
  Administered 2018-08-15 – 2018-08-16 (×3): 5 mg via ORAL
  Filled 2018-08-15 (×3): qty 1

## 2018-08-15 MED ORDER — PROMETHAZINE HCL 25 MG/ML IJ SOLN: 6.2500 mg | INTRAMUSCULAR | Status: DC | PRN

## 2018-08-15 MED ORDER — HYDROCODONE-ACETAMINOPHEN 7.5-325 MG PO TABS
1.0000 | ORAL_TABLET | Freq: Four times a day (QID) | ORAL | Status: AC | PRN
  Administered 2018-08-15 (×2): 1 via ORAL
  Filled 2018-08-15 (×3): qty 1

## 2018-08-15 MED ORDER — OXYCODONE HCL 5 MG/5ML PO SOLN: 5.0000 mg | Freq: Once | ORAL | Status: DC | PRN

## 2018-08-15 MED ORDER — LIDOCAINE HCL (PF) 2 % IJ SOLN
INTRAMUSCULAR | Status: AC
  Filled 2018-08-15: qty 10

## 2018-08-15 MED ORDER — OXYCODONE HCL 5 MG PO TABS: 5.0000 mg | ORAL_TABLET | Freq: Once | ORAL | Status: DC | PRN

## 2018-08-15 SURGICAL SUPPLY — 34 items
BARRIER SKIN 2 1/4 (WOUND CARE) ×4 IMPLANT
BARRIER SKIN 2 1/4INCH (WOUND CARE) ×2
BARRIER SKIN OD1.75 2 1/4 FLNG (WOUND CARE) IMPLANT
CANISTER SUCT 1200ML W/VALVE (MISCELLANEOUS) ×3 IMPLANT
CHLORAPREP W/TINT 26ML (MISCELLANEOUS) ×1 IMPLANT
COVER WAND RF STERILE (DRAPES) ×3 IMPLANT
DRAPE LAPAROTOMY 100X77 ABD (DRAPES) ×3 IMPLANT
DRSG OPSITE POSTOP 4X10 (GAUZE/BANDAGES/DRESSINGS) ×1 IMPLANT
DRSG OPSITE POSTOP 4X8 (GAUZE/BANDAGES/DRESSINGS) ×1 IMPLANT
DRSG TEGADERM 4X10 (GAUZE/BANDAGES/DRESSINGS) ×1 IMPLANT
DRSG TELFA 3X8 NADH (GAUZE/BANDAGES/DRESSINGS) IMPLANT
ELECT REM PT RETURN 9FT ADLT (ELECTROSURGICAL) ×3
ELECTRODE REM PT RTRN 9FT ADLT (ELECTROSURGICAL) ×1 IMPLANT
GLOVE BIO SURGEON STRL SZ 6.5 (GLOVE) ×2 IMPLANT
GLOVE BIO SURGEONS STRL SZ 6.5 (GLOVE) ×1
GOWN STRL REUS W/ TWL LRG LVL3 (GOWN DISPOSABLE) ×2 IMPLANT
GOWN STRL REUS W/TWL LRG LVL3 (GOWN DISPOSABLE) ×4
KIT TURNOVER KIT A (KITS) ×3 IMPLANT
LABEL OR SOLS (LABEL) ×3 IMPLANT
NS IRRIG 1000ML POUR BTL (IV SOLUTION) ×3 IMPLANT
PACK BASIN MAJOR ARMC (MISCELLANEOUS) ×3 IMPLANT
PACK COLON CLEAN CLOSURE (MISCELLANEOUS) ×1 IMPLANT
PAD DRESSING TELFA 3X8 NADH (GAUZE/BANDAGES/DRESSINGS) ×1 IMPLANT
POUCH DRAIN  2 1/4 MED RED 181 (OSTOMY) ×4 IMPLANT
SPONGE LAP 18X18 RF (DISPOSABLE) ×3 IMPLANT
SUT PDS AB 1 TP1 54 (SUTURE) ×2 IMPLANT
SUT SILK 2 0 (SUTURE) ×2
SUT SILK 2-0 18XBRD TIE 12 (SUTURE) ×1 IMPLANT
SUT SILK 3 0 (SUTURE) ×2
SUT SILK 3-0 18XBRD TIE 12 (SUTURE) ×1 IMPLANT
SUT VIC AB 3-0 SH 27 (SUTURE) ×4
SUT VIC AB 3-0 SH 27X BRD (SUTURE) ×2 IMPLANT
SYSTEM UROSTOMY GENTLE TOUCH (WOUND CARE) ×2 IMPLANT
TRAY FOLEY MTR SLVR 16FR STAT (SET/KITS/TRAYS/PACK) ×1 IMPLANT

## 2018-08-15 NOTE — Op Note (Signed)
Preoperative diagnosis: Ileostomy stricture  Postoperative diagnosis: Ileostomy stricture.  Procedure: Ileostomy dilation.   Anesthesia: Sedation   Surgeon: Dr. Windell Moment  Indications: Patient is a 62 y.o. 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 Patient is high risk for surgery and patient prefer to avoid surgery for ileostomy reversal due to possible complications. Dilation of the ileostomy was elected as a less invasive treatment to decrease bowel obstruction episodes.   Findings: Critical stricture of the ileostomy. Unable to insert 5th finger At the end of procedure, the second finger was inserted without resistance. Dilation up to Hegar 18  Description of procedure: The patient was placed on the operating table in the supine position. General anesthesia was induced. A time-out was completed verifying correct patient, procedure, site, positioning, and implant(s) and/or special equipment prior to beginning this procedure.  With Hegar dilator starting on 2, the skin of the ileostomy was dilated serially up to Hegar dilator 18. The index finger was inserted without difficulty. The mucosa looks healthy. No bleeding. Significant amount of stool output drained during procedure.  The patient tolerated the procedure well and was taken to the postanesthesia care unit in stable condition.   Specimen: None   Complications: None  EBL: minimal

## 2018-08-15 NOTE — Progress Notes (Signed)
Order received from Dr Posey Pronto to add hgb a1c to previous lab draw

## 2018-08-15 NOTE — Anesthesia Postprocedure Evaluation (Signed)
Anesthesia Post Note  Patient: Danielle [name]  Procedure(s) Performed: DILATION OF ILEOSTOMY STRICTURE (N/A )  Patient location during evaluation: PACU Anesthesia Type: General Level of consciousness: awake and alert and oriented Pain management: pain level controlled Vital Signs Assessment: post-procedure vital signs reviewed and stable Respiratory status: spontaneous breathing, nonlabored ventilation and respiratory function stable Cardiovascular status: blood pressure returned to baseline and stable Postop Assessment: no signs of nausea or vomiting Anesthetic complications: no     Last Vitals:  Vitals:   08/15/18 0829 08/15/18 0903  BP: 113/73   Pulse: 69 64  Resp: 15 14  Temp: 36.7 C 36.6 C  SpO2: 100% 100%    Last Pain:  Vitals:   08/15/18 0903  TempSrc:   PainSc: 0-No pain                 Kendyn Zaman

## 2018-08-15 NOTE — Care Management (Signed)
Update:  Per Corene Cornea with [facility] they have already set up patient with the [facility] and they were going to provide the patient with supplies. Daughter was unaware of this.

## 2018-08-15 NOTE — Anesthesia Procedure Notes (Signed)
Date/Time: 08/15/2018 7:30 AM Performed by: Johnna Acosta, CRNA Pre-anesthesia Checklist: Patient identified, Emergency Drugs available, Suction available, Patient being monitored and Timeout performed Patient Re-evaluated:Patient Re-evaluated prior to induction Oxygen Delivery Method: Simple face mask Preoxygenation: Pre-oxygenation with 100% oxygen Induction Type: IV induction

## 2018-08-15 NOTE — Anesthesia Preprocedure Evaluation (Addendum)
Anesthesia Evaluation  Patient identified by MRN, date of birth, ID band Patient awake    Reviewed: Allergy & Precautions, NPO status , Patient's Chart, lab work & pertinent test results  History of Anesthesia Complications Negative for: history of anesthetic complications  Airway Mallampati: III  TM Distance: >3 FB Neck ROM: Full    Dental  (+) Poor Dentition   Pulmonary neg sleep apnea, COPD (occasional home O2 use),  COPD inhaler, former smoker,    breath sounds clear to auscultation- rhonchi (-) wheezing      Cardiovascular hypertension, Pt. on medications +CHF  (-) CAD, (-) Past MI, (-) Cardiac Stents and (-) CABG  Rhythm:Regular Rate:Normal - Systolic murmurs and - Diastolic murmurs 04/19/21: - Left ventricle: Systolic function was moderately reduced. The   estimated ejection fraction was in the range of 35% to 40%. - Aortic valve: There was very mild stenosis. There was trivial   regurgitation. Valve area (VTI): 1.2 cm^2. Valve area (Vmax):   1.03 cm^2. Valve area (Vmean): 1.13 cm^2. - Mitral valve: There was moderate regurgitation. - Left atrium: The atrium was mildly dilated.   Neuro/Psych PSYCHIATRIC DISORDERS Anxiety Depression negative neurological ROS     GI/Hepatic Neg liver ROS, PUD,   Endo/Other  diabetes  Renal/GU Renal InsufficiencyRenal disease     Musculoskeletal   Abdominal (+) - obese,   Peds  Hematology  (+) Blood dyscrasia, anemia ,   Anesthesia Other Findings Past Medical History: No date: Anxiety No date: Arthritis left : Cancer ([place])     Comment:  breast cancer 2000, chemo tx's with total mastectomy and              lymph nodes resected.  02/21/2016: Cancer of right lung ([place])     Comment:  rad tx's.  No date: CHF (congestive heart failure) (HCC) No date: COPD (chronic obstructive pulmonary disease) (HCC) No date: Dependence on supplemental oxygen No date: Depression No date:  Diabetes mellitus without complication (HCC) No date: Heart murmur No date: Hypertension No date: Lung nodule No date: Lymphedema No date: Shortness of breath dyspnea     Comment:  with exertion 2001: Status post chemotherapy     Comment:  left breast cancer 2001: Status post radiation therapy     Comment:  left breast cancer   Reproductive/Obstetrics                            Anesthesia Physical Anesthesia Plan  ASA: III  Anesthesia Plan: General   Post-op Pain Management:    Induction: Intravenous  PONV Risk Score and Plan: 2 and Propofol infusion  Airway Management Planned: Natural Airway  Additional Equipment:   Intra-op Plan:   Post-operative Plan:   Informed Consent: I have reviewed the patients History and Physical, chart, labs and discussed the procedure including the risks, benefits and alternatives for the proposed anesthesia with the patient or authorized representative who has indicated his/her understanding and acceptance.   Dental advisory given  Plan Discussed with: CRNA and Anesthesiologist  Anesthesia Plan Comments:         Anesthesia Quick Evaluation

## 2018-08-15 NOTE — Progress Notes (Addendum)
[facility] at [place] NAME: Benjamin Merrihew    MR#:  [id number]  DATE OF BIRTH:  29-Jul-1956  SUBJECTIVE:  Doing well this morning. She is post dilatation of her ileostomy REVIEW OF SYSTEMS:  Review of Systems  Constitutional: Negative for chills and fever.  HENT: Negative for sinus pain and sore throat.   Eyes: Negative for blurred vision and double vision.  Respiratory: Negative for cough and shortness of breath.   Cardiovascular: Negative for chest pain and palpitations.  Gastrointestinal: Positive for abdominal pain. Negative for nausea and vomiting.  Genitourinary: Negative for dysuria and frequency.  Musculoskeletal: Negative for back pain and neck pain.  Neurological: Negative for dizziness and headaches.  Psychiatric/Behavioral: Negative for depression. The patient is not nervous/anxious.     DRUG ALLERGIES:  No Known Allergies VITALS:  Blood pressure (!) 90/50, pulse 92, temperature 98.1 F (36.7 C), temperature source Oral, resp. rate 17, height 5\' 7"  (1.702 m), weight 54.7 kg, SpO2 100 %. PHYSICAL EXAMINATION:  Physical Exam  Constitutional:  Sitting up on the edge of the bed, in no acute distress HEENT: Normocephalic, atraumatic, EOMI, no scleral icterus, moist mucous membranes Neck: Supple, full range of motion Cardiovascular: RRR, no murmurs, rubs, or gallops Respiratory: CTAB, no wheezing, normal work of breathing Gastrointestinal: +soft, non-distended. +moderate tenderness in the RUQ/epigastric area, Ileostomy on the right and mucous fistula stoma on the left are both patent, liquid brown stool present in ostomy bag Musculoskeletal: No lower extremity edema   Neurologic:  Normal speech and language. No gross focal neurologic deficits are appreciated. Skin:  Skin is warm, dry and intact. No rash noted. Psychiatric: Mood and affect are normal. Speech and behavior are normal. LABORATORY PANEL:  Female CBC Recent Labs  Lab  08/15/18 0414  WBC 5.6  HGB 8.2*  HCT 25.2*  PLT 277   ------------------------------------------------------------------------------------------------------------------ Chemistries  Recent Labs  Lab 08/12/18 0345  08/15/18 0414  NA 130*   < > 134*  K 4.6   < > 4.1  CL 103   < > 111  CO2 20*   < > 17*  GLUCOSE 85   < > 91  BUN 51*   < > 17  CREATININE 2.35*   < > 1.14*  CALCIUM 9.2   < > 9.0  MG 2.2  --   --   AST 26  --   --   ALT 13  --   --   ALKPHOS 82  --   --   BILITOT 0.5  --   --    < > = values in this interval not displayed.   RADIOLOGY:  No results found. ASSESSMENT AND PLAN:   Recurrent partial SBO-patient is s/p right hemicolectomy with end ileostomy.  Abdominal pain has improved and patient having bowel movements -Surgery following-s/p dilation of the ileostomy 08/15/2018  -resume  eliquis--ok with surgery -Continue soft mdiet -IV pain meds and antiemetics -No indication for NG tube at this time  Acute renal failure-likely due to dehydration. Improving.  -Holding Lasix -Avoid nephrotoxic agents -creat better 1.14  Paroxysmal atrial fibrillation-in normal sinus rhythm here -resume Eliquis -Continue Coreg  Chronic systolic congestive heart failure- stable, no signs of volume overload -Continue Coreg -Holding Lasix for now  Normocytic anemia- stable, hemoglobin at baseline -Monitor  Hypertension- BPs soft -Continue Coreg  Depression- stable -Continue Celexa  COPD-stable, no signs of acute exacerbation -Renew home inhalers  DVT prophylaxis- eliquis  For  surgery if everything moves in the right direction patient should be okay to go home tomorrow  All the records are reviewed and case discussed with Care Management/Social Worker. Management plans discussed with the patient, family and they are in agreement.  CODE STATUS: Full Code  TOTAL TIME TAKING CARE OF THIS PATIENT: 25 minutes.   More than 50% of the time was spent in  counseling/coordination of care: YES  POSSIBLE D/C IN 1-2 DAYS, DEPENDING ON CLINICAL CONDITION.   Fritzi Mandes M.D on 08/15/2018 at 11:14 AM  Between 7am to 6pm - Pager - [phone]  After 6pm go to [email or web address] - Proofreader  [facility]  Office  [phone]  CC: Primary care physician; Elisabeth Cara, NP  Note: This dictation was prepared with Dragon dictation along with smaller phrase technology. Any transcriptional errors that result from this process are unintentional.

## 2018-08-15 NOTE — Care Management (Addendum)
Patient will not qualify for continued charity home health services as she has exhausted her maximin 60 days for the year.   RNCM has notified patient as will as daughter [name].   [name] states that she was already aware of this.  Daughter states that she is responsible for providing ostomy care/pouch changes.  [name] states that patient is able to only empty the bag.  WOC RN documents that she has provided patient with [employer] program.  Patient has been provided this information on past admission.  RNCM provided daughter [name] with the below information as well.  [name] wrote down the information and states "I think I still have it from last time too"  [name] states "I thought they only had certain types of supplies and might not have what we would needed"  RNCM informed patient and daughter that the would have to call to purse the supplies in order to determine if she qualifies and if they have supplies she needs.  Bedside RN to provide patient with some supplies at discharge.   " indigent program information:  If you will need assistance with free pouches after discharge from the hospital you will need to contact [facility] directly.  Free pouches will only be supplied to those patients with no type of insurance coverage.   The contact information for the Consumer Assistance Program is [phone], press Option 6 and speak with Maudie Mercury.  This service is only for 3 months and will provide you with limited types of ostomy supplies to meet your needs.  You will have to contact [facility] directly the ostomy nurse cannot assist with this process. "

## 2018-08-15 NOTE — Interval H&P Note (Signed)
History and Physical Interval Note:  08/15/2018 7:22 AM  Danielle [name]  has presented today for surgery, with the diagnosis of ileostomy stricture  The various methods of treatment have been discussed with the patient and family. After consideration of risks, benefits and other options for treatment, the patient has consented to  Procedure(s): DILATION OF ILEOSTOMY STRICTURE (N/A) as a surgical intervention .  The patient's history has been reviewed, patient examined, no change in status, stable for surgery.  I have reviewed the patient's chart and labs.  Questions were answered to the patient's satisfaction.     Herbert Pun

## 2018-08-15 NOTE — Transfer of Care (Signed)
Immediate Anesthesia Transfer of Care Note  Patient: Danielle [name]  Procedure(s) Performed: DILATION OF ILEOSTOMY STRICTURE (N/A )  Patient Location: PACU  Anesthesia Type:General  Level of Consciousness: awake and alert   Airway & Oxygen Therapy: Patient Spontanous Breathing and Patient connected to face mask oxygen  Post-op Assessment: Report given to RN and Post -op Vital signs reviewed and stable  Post vital signs: Reviewed and stable  Last Vitals:  Vitals Value Taken Time  BP 113/73 08/15/2018  8:29 AM  Temp    Pulse 63 08/15/2018  8:30 AM  Resp 14 08/15/2018  8:30 AM  SpO2 100 % 08/15/2018  8:30 AM  Vitals shown include unvalidated device data.  Last Pain:  Vitals:   08/15/18 0829  TempSrc: Temporal  PainSc:       Patients Stated Pain Goal: 0 (34/03/52 4818)  Complications: No apparent anesthesia complications

## 2018-08-15 NOTE — Anesthesia Post-op Follow-up Note (Signed)
Anesthesia QCDR form completed.        

## 2018-08-16 ENCOUNTER — Encounter: Payer: Self-pay | Admitting: General Surgery

## 2018-08-16 NOTE — Care Management (Signed)
Patient to be discharged per MD order. Patient has ostomy. Unfortunately, she has used her 60 days of charity home health and is not able to have more. Primary RN will supply her with several osomty supplies and on Monday patient is to contact [facility] and they will arrange 3 months worth of supplies. I have personally provided that patient with that contact number. Patient will have her daughter and three grand children assist with her home care needs. Patient verbalizes complete understanding with discharge plan and voices no safety concerns. Family to transport.  Ines Bloomer RN BSN RNCM [phone]

## 2018-08-16 NOTE — Discharge Summary (Signed)
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 Anxiety   . Arthritis   . Cancer ([place]) left    breast cancer 2000, chemo tx's with total mastectomy and lymph nodes resected.   . Cancer of right lung ([place]) 02/21/2016   rad tx's.   . CHF (congestive heart failure) ([place])   . COPD (chronic obstructive pulmonary disease) ([place])   . Dependence on supplemental oxygen   . Depression   . Diabetes mellitus without complication ([place])   . Heart murmur   . Hypertension   . Lung nodule   . Lymphedema   . Shortness of breath dyspnea    with exertion  . Status post chemotherapy 2001   left breast cancer  . 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  Abdominal pain has improved and patient having bowel movements -Surgery following-s/p dilation of the ileostomy 08/15/2018  -resume  eliquis--ok with surgery -Continue soft diet tolerating it well -prn pain meds and antiemetics  *Acute renal failure-likely due to dehydration. Improving.  -resumed lasix at d/c -Avoid nephrotoxic agents -creat better 1.14  Paroxysmal atrial fibrillation-in normal sinus rhythm here -resume Eliquis -Continue Coreg  Chronic systolic congestive heart failure- stable, no signs of volume overload -Continue Coreg -Holding Lasix for now  Normocytic  anemia- stable, hemoglobin at baseline  Hypertension- BPs soft -Continue Coreg  Depression- stable -Continue Celexa  COPD-stable, no signs of acute exacerbation -Renew home inhalers  DVT prophylaxis- eliquis   Overall patient is doing well. Okay from surgery standpoint to go home. Patient is agreeable and eager to go home. Should follow-up with surgery as outpatient. CONSULTS OBTAINED:  Treatment Team:  Arta Silence, MD Herbert Pun, MD  DRUG ALLERGIES:  No Known Allergies  DISCHARGE MEDICATIONS:   Allergies as of 08/16/2018   No Known Allergies     Medication List    TAKE these medications   albuterol 108 (90 Base) MCG/ACT inhaler Commonly known as:  PROVENTIL HFA;VENTOLIN HFA Inhale 2 puffs into the lungs every 6 (six) hours as needed for wheezing or shortness of breath.   apixaban 5 MG Tabs tablet Commonly known as:  ELIQUIS Take 1 tablet (5 mg total) by mouth 2 (two) times daily.   budesonide-formoterol 160-4.5 MCG/ACT inhaler Commonly known as:  SYMBICORT Inhale 2 puffs into the lungs 2 (two) times daily.   carvedilol 6.25 MG tablet Commonly known as:  COREG Take 1 tablet (6.25 mg total) by mouth 2 (two) times daily.   citalopram 40 MG tablet Commonly known as:  CELEXA Take 40 mg by mouth daily.   ferrous sulfate 325 (65 FE) MG tablet Take 1 tablet (325 mg total) by mouth 2 (two) times daily with a meal.   furosemide 20 MG tablet Commonly known as:  LASIX Take 20 mg by mouth daily.   HYDROcodone-acetaminophen 5-325 MG tablet Commonly known as:  NORCO/VICODIN Take 1 tablet by mouth every 6 (six) hours as needed for moderate pain or severe pain.   megestrol 400 MG/10ML suspension Commonly known as:  MEGACE Take 7.5 mLs (300 mg total) by mouth 2 (two) times daily.   multivitamin with minerals Tabs tablet Take 1 tablet by mouth daily.   pantoprazole 40 MG tablet Commonly known as:  PROTONIX Take 1 tablet (40 mg total) by  mouth 2 (two) times daily before a meal.   traMADol 50 MG tablet Commonly known as:  ULTRAM Take 50 mg by mouth every 6 (six) hours as needed. for pain       If you experience worsening of your admission symptoms, develop shortness of breath, life threatening emergency, suicidal or homicidal thoughts you must seek medical attention immediately by calling 911 or calling your MD immediately  if symptoms less severe.  You Must read complete instructions/literature along with all the possible adverse reactions/side effects for all the Medicines you take and that have been prescribed to you. Take any new Medicines after you have completely understood and accept all the possible adverse reactions/side effects.   Please note  You were cared for by a hospitalist during your hospital stay. If you have any questions about your discharge medications or the care you received while you were in the hospital after you are discharged, you can call the unit and asked to speak with the hospitalist on call if the hospitalist that took care of you is not available. Once you are discharged, your primary care physician will handle any further medical issues. Please note that NO REFILLS for any discharge medications will be authorized once you are discharged, as it is imperative that you return to your primary care physician (or establish a relationship with a primary care physician if you do not have one) for your aftercare needs so that they can reassess your need for medications and monitor your lab values. Today   SUBJECTIVE   No new complaints. No abdominal pain. Tolerating soft diet.  VITAL SIGNS:  Blood pressure 106/70, pulse 85, temperature 98.9 F (37.2 C), temperature source Oral, resp. rate 16, height 5\' 7"  (1.702 m), weight 54.7 kg, SpO2 97 %.  I/O:    Intake/Output Summary (Last 24 hours) at 08/16/2018 1029 Last data filed at 08/16/2018 0521 Gross per 24 hour  Intake 480 ml  Output 775 ml  Net  -295 ml    PHYSICAL EXAMINATION:  GENERAL:  62 y.o.-year-old patient lying in the bed with no acute distress.  EYES: Pupils equal, round, reactive to light and accommodation. No scleral icterus. Extraocular muscles intact.  HEENT: Head atraumatic, normocephalic. Oropharynx and nasopharynx clear.  NECK:  Supple, no jugular venous distention. No thyroid enlargement, no tenderness.  LUNGS: Normal breath sounds bilaterally, no wheezing, rales,rhonchi or crepitation. No use of accessory muscles of respiration.  CARDIOVASCULAR: S1, S2 normal. No murmurs, rubs, or gallops.  ABDOMEN: +soft, non-distended. +moderate tenderness in the RUQ/epigastric area,Ileostomy on theright and mucous fistula stoma on the left are both patent, liquid brown stool present in ostomy bag EXTREMITIES: No pedal edema, cyanosis, or clubbing.  NEUROLOGIC: Cranial nerves II through XII are intact. Muscle strength 5/5 in all extremities. Sensation intact. Gait not checked.  PSYCHIATRIC: The patient is alert and oriented x 3.  SKIN: No obvious rash, lesion, or ulcer.   DATA REVIEW:   CBC  Recent Labs  Lab 08/15/18 0414  WBC 5.6  HGB 8.2*  HCT 25.2*  PLT 277  Chemistries  Recent Labs  Lab 08/12/18 0345  08/15/18 0414  NA 130*   < > 134*  K 4.6   < > 4.1  CL 103   < > 111  CO2 20*   < > 17*  GLUCOSE 85   < > 91  BUN 51*   < > 17  CREATININE 2.35*   < > 1.14*  CALCIUM 9.2   < > 9.0  MG 2.2  --   --   AST 26  --   --   ALT 13  --   --   ALKPHOS 82  --   --   BILITOT 0.5  --   --    < > = values in this interval not displayed.    Microbiology Results   Recent Results (from the past 240 hour(s))  Surgical PCR screen     Status: None   Collection Time: 08/14/18 10:04 PM  Result Value Ref Range Status   MRSA, PCR NEGATIVE NEGATIVE Final   Staphylococcus aureus NEGATIVE NEGATIVE Final    Comment: (NOTE) The Xpert SA Assay (FDA approved for NASAL specimens in patients 62 years of age and older), is  one component of a comprehensive surveillance program. It is not intended to diagnose infection nor to guide or monitor treatment. Performed at [facility], [address] [place], [place] [address]     RADIOLOGY:  No results found.   Management plans discussed with the patient, family and they are in agreement.  CODE STATUS:     Code Status Orders  (From admission, onward)         Start     Ordered   08/12/18 0541  Full code  Continuous     08/12/18 0540        Code Status History    Date Active Date Inactive Code Status Order ID Comments User Context   07/21/2018 0332 07/23/2018 1718 Full Code [id number]  Derrek Monaco, RN Inpatient   07/18/2018 0640 07/21/2018 0330 DNR [id number]  Harrie Foreman, MD Inpatient   07/05/2018 0808 07/09/2018 1946 DNR [id number]  Harrie Foreman, MD Inpatient   06/30/2018 1733 07/02/2018 1507 DNR [id number]  [name], MD Inpatient   06/30/2018 0019 06/30/2018 1733 Full Code [id number]  Amelia Jo, MD Inpatient   06/04/2018 0113 06/05/2018 1448 Full Code [id number]  Amelia Jo, MD Inpatient   04/21/2018 0633 05/17/2018 2214 Full Code [id number]  Arta Silence, MD Inpatient   02/09/2017 1125 02/11/2017 1706 Full Code [id number]  Henreitta Leber, MD Inpatient    Advance Directive Documentation     Most Recent Value  Type of Advance Directive  Healthcare Power of Attorney  Pre-existing out of facility DNR order (yellow form or pink MOST form)  -  "MOST" Form in Place?  -      TOTAL TIME TAKING CARE OF THIS PATIENT: *40* minutes.    Fritzi Mandes M.D on 08/16/2018 at 10:29 AM  Between 7am to 6pm - Pager - [phone] After 6pm go to [email or web address] - password [email or web address] [facility]  Office  [phone]  CC: Primary care physician; Elisabeth Cara, NP

## 2018-08-16 NOTE — Progress Notes (Signed)
Discharged to home via POV. IV removed. Belongings gathered. Instructions given. Extra supplies given to hold over until supplies can be delivered.

## 2018-08-16 NOTE — Progress Notes (Signed)
[facility] Hospital Day(s): 4.   Post op day(s): 1 Day Post-Op.   Interval History: Patient seen and examined, no acute events or new complaints overnight. Patient reports feeling more comfortable with the dilation. Refers stool getting out better, denies nausea or vomiting. Denies significant pain. Patient sitting down comfortably.  Vital signs in last 24 hours: [min-max] current  Temp:  [98 F (36.7 C)-98.9 F (37.2 C)] 98.9 F (37.2 C) (11/02 0521) Pulse Rate:  [78-92] 85 (11/02 0521) Resp:  [15-18] 16 (11/02 0521) BP: (80-115)/(50-82) 106/70 (11/02 0521) SpO2:  [97 %-100 %] 97 % (11/02 0521)     Height: 5\' 7"  (170.2 cm) Weight: 54.7 kg BMI (Calculated): 18.88   Physical Exam:  Constitutional: alert, cooperative and no distress  Respiratory: breathing non-labored at rest  Cardiovascular: regular rate and sinus rhythm  Gastrointestinal: soft, non-tender, and non-distended. Adequate ileostomy opening with good deep mucosa.   Labs:  CBC Latest Ref Rng & Units 08/15/2018 08/14/2018 08/13/2018  WBC 4.0 - 10.5 K/uL 5.6 6.6 6.2  Hemoglobin 12.0 - 15.0 g/dL 8.2(L) 8.9(L) 8.5(L)  Hematocrit 36.0 - 46.0 % 25.2(L) 28.3(L) 26.7(L)  Platelets 150 - 400 K/uL 277 315 293   CMP Latest Ref Rng & Units 08/15/2018 08/14/2018 08/13/2018  Glucose 70 - 99 mg/dL 91 129(H) 73  BUN 8 - 23 mg/dL 17 27(H) 34(H)  Creatinine 0.44 - 1.00 mg/dL 1.14(H) 1.34(H) 1.33(H)  Sodium 135 - 145 mmol/L 134(L) 135 138  Potassium 3.5 - 5.1 mmol/L 4.1 3.6 4.2  Chloride 98 - 111 mmol/L 111 109 113(H)  CO2 22 - 32 mmol/L 17(L) 18(L) 20(L)  Calcium 8.9 - 10.3 mg/dL 9.0 9.3 8.9  Total Protein 6.5 - 8.1 g/dL - - -  Total Bilirubin 0.3 - 1.2 mg/dL - - -  Alkaline Phos 38 - 126 U/L - - -  AST 15 - 41 U/L - - -  ALT 0 - 44 U/L - - -    Imaging studies: No new pertinent imaging studies   Assessment/Plan:  62 y.o. female with ileostomy stricture 1 Day Post-Op s/p dilation of ileostomy.  Patient  tolerated the procedure well. Today feeling comfortable. Patient again oriented about the goal of the procedure which is to see if frequency of small bowel obstruction episode decrease. If she develop SBO and the stricture has not recurred, then the problem seems to be deeper (adhesions most likely). If she does not develop SBO while as the ileostomy is not narrowed, then the problem was the stricture and a formal revision can be considered if necessary in the future.   From surgery stand point, patient may be discharged. Will follow as outpatient in 2 weeks.   Arnold Long, MD

## 2018-08-20 ENCOUNTER — Telehealth: Payer: Self-pay

## 2018-08-20 NOTE — Telephone Encounter (Signed)
Flagged on EMMI report for not knowing who to call about changes in condition. Called and spoke with patient.  Encouraged her to reach out to her PCP or Dr. Peyton Najjar- Diaz's offices if she had any questions or concerns.  She mentioned she sometimes has issues contacting her PCP for questions.  No on-call staff during weekends or after hours. Encouraged her to look for another provider if her current one was not able to meet her needs.  She declined resources for finding another physician.  No other questions or concerns at this time.  I thanked her for her time and informed her she would receive one more automated call checking in during the next few days.

## 2018-08-21 ENCOUNTER — Encounter: Payer: Self-pay | Admitting: Pharmacist

## 2018-08-21 ENCOUNTER — Ambulatory Visit: Payer: Self-pay | Admitting: Pharmacist

## 2018-08-21 VITALS — BP 120/78 | Ht 67.0 in | Wt 119.0 lb

## 2018-08-21 DIAGNOSIS — Z79899 Other long term (current) drug therapy: Secondary | ICD-10-CM

## 2018-08-21 NOTE — Progress Notes (Signed)
Medication Management Clinic Visit Note  Patient: Danielle [name] MRN: [id number] Date of Birth: September 22, 1956 PCP: Elisabeth Cara, NP   Danielle [name] 62 y.o. female presents for an initial MTM visit today.  BP 120/78 (BP Location: Right Arm, Patient Position: Sitting, Cuff Size: Normal)   Ht 5\' 7"  (1.702 m)   Wt 119 lb (54 kg)   BMI 18.64 kg/m   Patient Information   Past Medical History:  Diagnosis Date  . Anxiety   . Arthritis   . Cancer ([place]) left    breast cancer 2000, chemo tx's with total mastectomy and lymph nodes resected.   . Cancer of right lung ([place]) 02/21/2016   rad tx's.   . CHF (congestive heart failure) ([place])   . COPD (chronic obstructive pulmonary disease) ([place])   . Dependence on supplemental oxygen   . Depression   . Diabetes mellitus without complication ([place])   . Heart murmur   . Hypertension   . Lung nodule   . Lymphedema   . Shortness of breath dyspnea    with exertion  . Status post chemotherapy 2001   left breast cancer  . Status post radiation therapy 2001   left breast cancer      Past Surgical History:  Procedure Laterality Date  . Breast Biospy Left    [facility]  . BREAST SURGERY    . COLONOSCOPY N/A 04/30/2018   Procedure: COLONOSCOPY;  Surgeon: Virgel Manifold, MD;  Location: [facility] ENDOSCOPY;  Service: Endoscopy;  Laterality: N/A;  . COLONOSCOPY N/A 07/22/2018   Procedure: COLONOSCOPY;  Surgeon: Virgel Manifold, MD;  Location: [facility] ENDOSCOPY;  Service: Endoscopy;  Laterality: N/A;  . DILATION AND CURETTAGE OF UTERUS    . ELECTROMAGNETIC NAVIGATION BROCHOSCOPY Right 04/11/2016   Procedure: ELECTROMAGNETIC NAVIGATION BRONCHOSCOPY;  Surgeon: Vilinda Boehringer, MD;  Location: [facility] ORS;  Service: Cardiopulmonary;  Laterality: Right;  . ESOPHAGOGASTRODUODENOSCOPY N/A 07/22/2018   Procedure: ESOPHAGOGASTRODUODENOSCOPY (EGD);  Surgeon: Virgel Manifold, MD;  Location[facility] ENDOSCOPY;  Service: Endoscopy;  Laterality: N/A;  .  ESOPHAGOGASTRODUODENOSCOPY (EGD) WITH PROPOFOL N/A 05/07/2018   Procedure: ESOPHAGOGASTRODUODENOSCOPY (EGD) WITH PROPOFOL;  Surgeon: Lucilla Lame, MD;  Location[facility] ENDOSCOPY;  Service: Endoscopy;  Laterality: N/A;  . history of colonoscopy]    . ILEOSCOPY N/A 07/22/2018   Procedure: ILEOSCOPY THROUGH STOMA;  Surgeon: Virgel Manifold, MD;  Location: [facility] ENDOSCOPY;  Service: Endoscopy;  Laterality: N/A;  . ILEOSTOMY    . ILEOSTOMY CLOSURE N/A 08/15/2018   Procedure: DILATION OF ILEOSTOMY STRICTURE;  Surgeon: Herbert Pun, MD;  Location: [facility] ORS;  Service: General;  Laterality: N/A;  . LAPAROTOMY Right 05/04/2018   Procedure: EXPLORATORY LAPAROTOMY right colectomy right and left ostomy;  Surgeon: Herbert Pun, MD;  Location: [facility] ORS;  Service: General;  Laterality: Right;  . LUNG BIOPSY    . MASTECTOMY Left    2000, [facility]  . ROTATOR CUFF REPAIR Right    [facility]     Family History  Problem Relation Age of Onset  . Breast cancer Mother 43  . Cancer Mother        Breast   . Cirrhosis Father   . Breast cancer Paternal Aunt 56  . Cancer Maternal Aunt        Breast     New Diagnoses (since last visit):   Family Support: Good  Lifestyle Diet: Breakfast: eggs, toast, sometimes bacon, and coffee Lunch: soup or sandwich Dinner: fried chicken, creamed potatoes, green beans Drinks: water  Social History   Substance and Sexual Activity  Alcohol Use Yes   Comment: Occasionally      Social History   Tobacco Use  Smoking Status Former Smoker  . Packs/day: 0.50  . Years: 20.00  . Pack years: 10.00  . Types: Cigarettes  . Last attempt to quit: 07/02/2012  . Years since quitting: 6.1  Smokeless Tobacco Never Used  Tobacco Comment   quit 2014     Health Maintenance  Topic Date Due  . Hepatitis C Screening  [birth year]  . TETANUS/TDAP  04/06/1973  . PAP SMEAR  04/07/1975  . MAMMOGRAM  06/13/2019  . COLONOSCOPY  07/22/2028  . INFLUENZA  VACCINE  Completed  . HIV Screening  Completed   Outpatient Encounter Medications as of 08/21/2018  Medication Sig  . albuterol (PROVENTIL HFA;VENTOLIN HFA) 108 (90 Base) MCG/ACT inhaler Inhale 2 puffs into the lungs every 6 (six) hours as needed for wheezing or shortness of breath.  [name] apixaban (ELIQUIS) 5 MG TABS tablet Take 1 tablet (5 mg total) by mouth 2 (two) times daily.  . budesonide-formoterol (SYMBICORT) 160-4.5 MCG/ACT inhaler Inhale 2 puffs into the lungs 2 (two) times daily.  . carvedilol (COREG) 6.25 MG tablet Take 1 tablet (6.25 mg total) by mouth 2 (two) times daily.  . citalopram (CELEXA) 40 MG tablet Take 40 mg by mouth daily.  . ferrous sulfate 325 (65 FE) MG tablet Take 1 tablet (325 mg total) by mouth 2 (two) times daily with a meal.  . furosemide (LASIX) 20 MG tablet Take 20 mg by mouth daily.   [name] HYDROcodone-acetaminophen (NORCO/VICODIN) 5-325 MG tablet Take 1 tablet by mouth every 6 (six) hours as needed for moderate pain or severe pain.  . megestrol (MEGACE) 400 MG/10ML suspension Take 7.5 mLs (300 mg total) by mouth 2 (two) times daily.  . Multiple Vitamin (MULTIVITAMIN WITH MINERALS) TABS tablet Take 1 tablet by mouth daily.  . pantoprazole (PROTONIX) 40 MG tablet Take 1 tablet (40 mg total) by mouth 2 (two) times daily before a meal.  . traMADol (ULTRAM) 50 MG tablet Take 50 mg by mouth every 6 (six) hours as needed. for pain   No facility-administered encounter medications on file as of 08/21/2018.    ED visit: Last week PCP visit: Tuesday Specialist: [facility] - will see this month; Surgeon - will see this month  Assessment and Plan:  Adherence/Compliance: Pt adherent/compliant on all medications. Daughter is a great support system and helps manage medications.   COPD: does not miss any doses with symbicort and rinses after each dose; rarely needs to use albuterol  HF: well managed on carvedilol, lasix  Pain:Takes Tylenol which usually manages her pain;  doesn't like to take the as needed pain medications unless she really needs them, not even taking on a weekly basis  Appetite: taking Megace; pt reports a good appetite and it has improved from what it used to be  Anxiety/Depression: well managed on citalopram  Indigestion: well managed on pantoprazole   Return in a year.  Hinton Dyer, PharmD [facility]  Cosigned by: Netta Neat, PharmD, [facility]) [phone]

## 2018-09-02 ENCOUNTER — Encounter: Payer: Self-pay | Admitting: Emergency Medicine

## 2018-09-02 ENCOUNTER — Other Ambulatory Visit: Payer: Self-pay

## 2018-09-02 ENCOUNTER — Emergency Department
Admission: EM | Admit: 2018-09-02 | Discharge: 2018-09-02 | Disposition: A | Discharge status: Home / Self Care | Payer: Medicaid Other | Attending: Emergency Medicine | Admitting: Emergency Medicine

## 2018-09-02 DIAGNOSIS — R109 Unspecified abdominal pain: Secondary | ICD-10-CM | POA: Insufficient documentation

## 2018-09-02 DIAGNOSIS — Z5321 Procedure and treatment not carried out due to patient leaving prior to being seen by health care provider: Secondary | ICD-10-CM | POA: Insufficient documentation

## 2018-09-02 LAB — COMPREHENSIVE METABOLIC PANEL
ALBUMIN: 3.9 g/dL (ref 3.5–5.0)
ALK PHOS: 59 U/L (ref 38–126)
ALT: 14 U/L (ref 0–44)
AST: 17 U/L (ref 15–41)
Anion gap: 9 (ref 5–15)
BILIRUBIN TOTAL: 0.6 mg/dL (ref 0.3–1.2)
BUN: 18 mg/dL (ref 8–23)
CO2: 14 mmol/L — ABNORMAL LOW (ref 22–32)
Calcium: 9.5 mg/dL (ref 8.9–10.3)
Chloride: 113 mmol/L — ABNORMAL HIGH (ref 98–111)
Creatinine, Ser: 1.11 mg/dL — ABNORMAL HIGH (ref 0.44–1.00)
GFR calc Af Amer: 59 mL/min — ABNORMAL LOW (ref >60)
GFR calc non Af Amer: 51 mL/min — ABNORMAL LOW (ref >60)
GLUCOSE: 79 mg/dL (ref 70–99)
Potassium: 4 mmol/L (ref 3.5–5.1)
Sodium: 136 mmol/L (ref 135–145)
TOTAL PROTEIN: 7.3 g/dL (ref 6.5–8.1)

## 2018-09-02 LAB — CBC
HCT: 30.7 % — ABNORMAL LOW (ref 36.0–46.0)
Hemoglobin: 9.7 g/dL — ABNORMAL LOW (ref 12.0–15.0)
MCH: 30 pg (ref 26.0–34.0)
MCHC: 31.6 g/dL (ref 30.0–36.0)
MCV: 95 fL (ref 80.0–100.0)
PLATELETS: 341 10*3/uL (ref 150–400)
RBC: 3.23 MIL/uL — ABNORMAL LOW (ref 3.87–5.11)
RDW: 15.6 % — ABNORMAL HIGH (ref 11.5–15.5)
WBC: 8.4 10*3/uL (ref 4.0–10.5)
nRBC: 0 % (ref 0.0–0.2)

## 2018-09-02 LAB — LIPASE, BLOOD: Lipase: 38 U/L (ref 11–51)

## 2018-09-02 NOTE — ED Notes (Signed)
Informed staff pt wanted to leave.

## 2018-09-02 NOTE — ED Notes (Signed)
Pt advised registration staff that she would be leaving, pt was in no distress on departure.

## 2018-09-02 NOTE — ED Triage Notes (Addendum)
PT c/o RT sided flank pain that started last night in her back and is now radiating into her RT side abd..Pt has colostomy bag, states WDL.  PT denies any GU symptoms, denies n/v/d. VSS

## 2018-09-03 ENCOUNTER — Telehealth: Payer: Self-pay | Admitting: Emergency Medicine

## 2018-09-03 NOTE — Telephone Encounter (Signed)
Called patient due to lwot to inquire about condition and follow up plans. Says she has had no further pain, but she has already made appt with pcp

## 2018-09-04 ENCOUNTER — Other Ambulatory Visit: Payer: Self-pay

## 2018-09-04 ENCOUNTER — Emergency Department: Payer: Medicaid Other

## 2018-09-04 DIAGNOSIS — Z9049 Acquired absence of other specified parts of digestive tract: Secondary | ICD-10-CM

## 2018-09-04 DIAGNOSIS — I480 Paroxysmal atrial fibrillation: Secondary | ICD-10-CM | POA: Diagnosis present

## 2018-09-04 DIAGNOSIS — Z87891 Personal history of nicotine dependence: Secondary | ICD-10-CM

## 2018-09-04 DIAGNOSIS — Z85118 Personal history of other malignant neoplasm of bronchus and lung: Secondary | ICD-10-CM

## 2018-09-04 DIAGNOSIS — Z9981 Dependence on supplemental oxygen: Secondary | ICD-10-CM

## 2018-09-04 DIAGNOSIS — Z79891 Long term (current) use of opiate analgesic: Secondary | ICD-10-CM

## 2018-09-04 DIAGNOSIS — Z170 Estrogen receptor positive status [ER+]: Secondary | ICD-10-CM

## 2018-09-04 DIAGNOSIS — E785 Hyperlipidemia, unspecified: Secondary | ICD-10-CM | POA: Diagnosis present

## 2018-09-04 DIAGNOSIS — N179 Acute kidney failure, unspecified: Secondary | ICD-10-CM | POA: Diagnosis present

## 2018-09-04 DIAGNOSIS — K9413 Enterostomy malfunction: Principal | ICD-10-CM | POA: Diagnosis present

## 2018-09-04 DIAGNOSIS — N183 Chronic kidney disease, stage 3 (moderate): Secondary | ICD-10-CM | POA: Diagnosis present

## 2018-09-04 DIAGNOSIS — Z9012 Acquired absence of left breast and nipple: Secondary | ICD-10-CM

## 2018-09-04 DIAGNOSIS — Z7901 Long term (current) use of anticoagulants: Secondary | ICD-10-CM

## 2018-09-04 DIAGNOSIS — R0600 Dyspnea, unspecified: Secondary | ICD-10-CM | POA: Diagnosis present

## 2018-09-04 DIAGNOSIS — Z9221 Personal history of antineoplastic chemotherapy: Secondary | ICD-10-CM

## 2018-09-04 DIAGNOSIS — I5022 Chronic systolic (congestive) heart failure: Secondary | ICD-10-CM | POA: Diagnosis present

## 2018-09-04 DIAGNOSIS — Z803 Family history of malignant neoplasm of breast: Secondary | ICD-10-CM

## 2018-09-04 DIAGNOSIS — I130 Hypertensive heart and chronic kidney disease with heart failure and stage 1 through stage 4 chronic kidney disease, or unspecified chronic kidney disease: Secondary | ICD-10-CM | POA: Diagnosis present

## 2018-09-04 DIAGNOSIS — F329 Major depressive disorder, single episode, unspecified: Secondary | ICD-10-CM | POA: Diagnosis present

## 2018-09-04 DIAGNOSIS — F419 Anxiety disorder, unspecified: Secondary | ICD-10-CM | POA: Diagnosis present

## 2018-09-04 DIAGNOSIS — K56600 Partial intestinal obstruction, unspecified as to cause: Secondary | ICD-10-CM | POA: Diagnosis present

## 2018-09-04 DIAGNOSIS — J449 Chronic obstructive pulmonary disease, unspecified: Secondary | ICD-10-CM | POA: Diagnosis present

## 2018-09-04 DIAGNOSIS — E1122 Type 2 diabetes mellitus with diabetic chronic kidney disease: Secondary | ICD-10-CM | POA: Diagnosis present

## 2018-09-04 DIAGNOSIS — Z79899 Other long term (current) drug therapy: Secondary | ICD-10-CM

## 2018-09-04 DIAGNOSIS — M1990 Unspecified osteoarthritis, unspecified site: Secondary | ICD-10-CM | POA: Diagnosis present

## 2018-09-04 DIAGNOSIS — Z923 Personal history of irradiation: Secondary | ICD-10-CM

## 2018-09-04 DIAGNOSIS — Z853 Personal history of malignant neoplasm of breast: Secondary | ICD-10-CM

## 2018-09-04 DIAGNOSIS — D638 Anemia in other chronic diseases classified elsewhere: Secondary | ICD-10-CM | POA: Diagnosis present

## 2018-09-04 LAB — COMPREHENSIVE METABOLIC PANEL
ALBUMIN: 4.1 g/dL (ref 3.5–5.0)
ALK PHOS: 72 U/L (ref 38–126)
ALT: 18 U/L (ref 0–44)
ANION GAP: 10 (ref 5–15)
AST: 20 U/L (ref 15–41)
BILIRUBIN TOTAL: 0.6 mg/dL (ref 0.3–1.2)
BUN: 19 mg/dL (ref 8–23)
CHLORIDE: 109 mmol/L (ref 98–111)
CO2: 17 mmol/L — ABNORMAL LOW (ref 22–32)
CREATININE: 1.21 mg/dL — AB (ref 0.44–1.00)
Calcium: 9.8 mg/dL (ref 8.9–10.3)
GFR calc Af Amer: 54 mL/min — ABNORMAL LOW (ref >60)
GFR, EST NON AFRICAN AMERICAN: 46 mL/min — AB (ref >60)
Glucose, Bld: 94 mg/dL (ref 70–99)
Potassium: 3.9 mmol/L (ref 3.5–5.1)
Sodium: 136 mmol/L (ref 135–145)
Total Protein: 7.9 g/dL (ref 6.5–8.1)

## 2018-09-04 LAB — CBC
HEMATOCRIT: 30.6 % — AB (ref 36.0–46.0)
Hemoglobin: 9.8 g/dL — ABNORMAL LOW (ref 12.0–15.0)
MCH: 29.5 pg (ref 26.0–34.0)
MCHC: 32 g/dL (ref 30.0–36.0)
MCV: 92.2 fL (ref 80.0–100.0)
Platelets: 352 10*3/uL (ref 150–400)
RBC: 3.32 MIL/uL — ABNORMAL LOW (ref 3.87–5.11)
RDW: 15.2 % (ref 11.5–15.5)
WBC: 8.4 10*3/uL (ref 4.0–10.5)
nRBC: 0 % (ref 0.0–0.2)

## 2018-09-04 LAB — BRAIN NATRIURETIC PEPTIDE: B NATRIURETIC PEPTIDE 5: 182 pg/mL — AB (ref 0.0–100.0)

## 2018-09-04 LAB — LIPASE, BLOOD: Lipase: 36 U/L (ref 11–51)

## 2018-09-04 LAB — TROPONIN I: Troponin I: <0.03 ng/mL (ref <0.03)

## 2018-09-04 IMAGING — DX DG CHEST 1V
1 series · 1 of 1 positions shown · non-contrast
Comparison: 02/09/2017 and prior chest radiographs.  03/01/2017 CT

CLINICAL DATA: Syncope and vomiting. History of right lung cancer
and left breast cancer.

EXAM:
CHEST 1 VIEW

[chest ap]
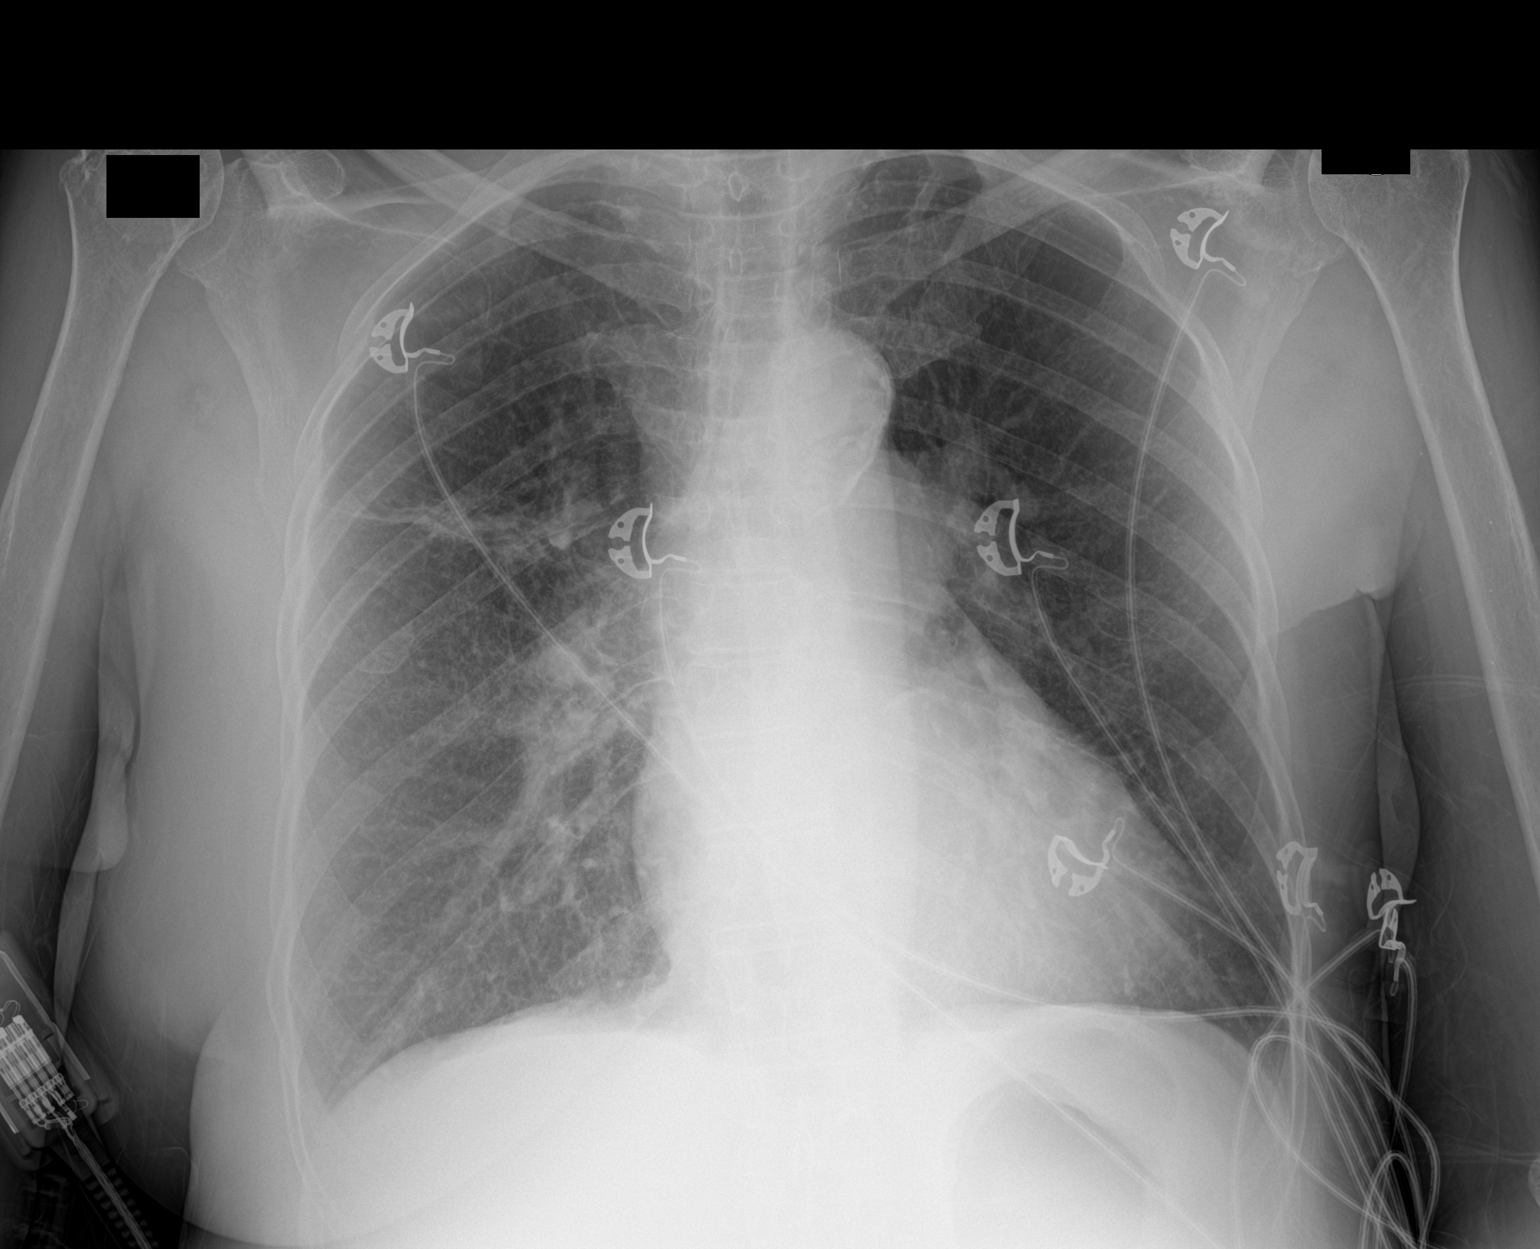

[1 of 1 positions shown; findings below may reference images not displayed]

FINDINGS: The cardiomediastinal silhouette is unremarkable.

Right upper lung opacities are unchanged.

There is no evidence of airspace disease, pleural effusion,
pulmonary edema, pneumothorax or new pulmonary opacity.

No acute bony abnormalities identified.
IMPRESSION: No evidence of acute cardiopulmonary disease.

Unchanged right upper lung opacities.

## 2018-09-04 NOTE — ED Triage Notes (Addendum)
Pt comes via POV from home with c/o lower abdominal pain that started today.  Pt states some vomiting. Pt denies diarrhea.   Pt denies any urinary syptoms and chest pain. Pt states some SHOB that started today as well. Pt has hx of COPD and wears 2L of O2 as needed.  Pt is alert and appears in NAD at this time.

## 2018-09-05 ENCOUNTER — Other Ambulatory Visit: Payer: Self-pay

## 2018-09-05 ENCOUNTER — Emergency Department: Payer: Medicaid Other

## 2018-09-05 ENCOUNTER — Inpatient Hospital Stay
Admission: EM | Admit: 2018-09-05 | Discharge: 2018-09-10 | DRG: 330 | Disposition: A | Discharge status: Home / Self Care | Payer: Medicaid Other | Attending: General Surgery | Admitting: General Surgery

## 2018-09-05 DIAGNOSIS — R112 Nausea with vomiting, unspecified: Secondary | ICD-10-CM | POA: Diagnosis present

## 2018-09-05 DIAGNOSIS — R1084 Generalized abdominal pain: Secondary | ICD-10-CM

## 2018-09-05 DIAGNOSIS — K56609 Unspecified intestinal obstruction, unspecified as to partial versus complete obstruction: Secondary | ICD-10-CM | POA: Diagnosis present

## 2018-09-05 LAB — GASTROINTESTINAL PANEL BY PCR, STOOL (REPLACES STOOL CULTURE)

## 2018-09-05 LAB — C DIFFICILE QUICK SCREEN W PCR REFLEX
C DIFFICLE (CDIFF) ANTIGEN: NEGATIVE
C Diff interpretation: NOT DETECTED
C Diff toxin: NEGATIVE

## 2018-09-05 LAB — PHOSPHORUS: PHOSPHORUS: 5.4 mg/dL — AB (ref 2.5–4.6)

## 2018-09-05 LAB — MAGNESIUM: Magnesium: 2.1 mg/dL (ref 1.7–2.4)

## 2018-09-05 LAB — GLUCOSE, CAPILLARY
GLUCOSE-CAPILLARY: 129 mg/dL — AB (ref 70–99)
Glucose-Capillary: 129 mg/dL — ABNORMAL HIGH (ref 70–99)

## 2018-09-05 MED ORDER — SENNOSIDES-DOCUSATE SODIUM 8.6-50 MG PO TABS: 1.0000 | ORAL_TABLET | Freq: Every evening | ORAL | Status: DC | PRN

## 2018-09-05 MED ORDER — CITALOPRAM HYDROBROMIDE 20 MG PO TABS
40.0000 mg | ORAL_TABLET | Freq: Every day | ORAL | Status: DC
  Administered 2018-09-05 – 2018-09-10 (×5): 40 mg via ORAL
  Filled 2018-09-05 (×5): qty 2

## 2018-09-05 MED ORDER — ACETAMINOPHEN 650 MG RE SUPP: 650.0000 mg | Freq: Four times a day (QID) | RECTAL | Status: DC | PRN

## 2018-09-05 MED ORDER — ONDANSETRON HCL 4 MG/2ML IJ SOLN
4.0000 mg | Freq: Once | INTRAMUSCULAR | Status: AC
  Administered 2018-09-05: 4 mg via INTRAVENOUS
  Filled 2018-09-05: qty 2

## 2018-09-05 MED ORDER — ONDANSETRON HCL 4 MG PO TABS: 4.0000 mg | ORAL_TABLET | Freq: Four times a day (QID) | ORAL | Status: DC | PRN

## 2018-09-05 MED ORDER — MORPHINE SULFATE (PF) 2 MG/ML IV SOLN
2.0000 mg | INTRAVENOUS | Status: DC | PRN
  Administered 2018-09-05 – 2018-09-08 (×6): 2 mg via INTRAVENOUS
  Administered 2018-09-08: 4 mg via INTRAVENOUS
  Administered 2018-09-09: 2 mg via INTRAVENOUS
  Administered 2018-09-09 – 2018-09-10 (×4): 4 mg via INTRAVENOUS
  Filled 2018-09-05: qty 2
  Filled 2018-09-05: qty 1
  Filled 2018-09-05 (×2): qty 2
  Filled 2018-09-05 (×3): qty 1
  Filled 2018-09-05 (×2): qty 2
  Filled 2018-09-05 (×2): qty 1
  Filled 2018-09-05: qty 2

## 2018-09-05 MED ORDER — BISACODYL 5 MG PO TBEC: 5.0000 mg | DELAYED_RELEASE_TABLET | Freq: Every day | ORAL | Status: DC | PRN

## 2018-09-05 MED ORDER — MEGESTROL ACETATE 400 MG/10ML PO SUSP
300.0000 mg | Freq: Two times a day (BID) | ORAL | Status: DC
  Administered 2018-09-05 – 2018-09-10 (×9): 300 mg via ORAL
  Filled 2018-09-05 (×13): qty 10

## 2018-09-05 MED ORDER — APIXABAN 5 MG PO TABS
5.0000 mg | ORAL_TABLET | Freq: Two times a day (BID) | ORAL | Status: DC
  Administered 2018-09-05 (×2): 5 mg via ORAL
  Filled 2018-09-05 (×2): qty 1

## 2018-09-05 MED ORDER — PANTOPRAZOLE SODIUM 40 MG PO TBEC
40.0000 mg | DELAYED_RELEASE_TABLET | Freq: Two times a day (BID) | ORAL | Status: DC
  Administered 2018-09-05 – 2018-09-10 (×10): 40 mg via ORAL
  Filled 2018-09-05 (×10): qty 1

## 2018-09-05 MED ORDER — IOHEXOL 300 MG/ML  SOLN
75.0000 mL | Freq: Once | INTRAMUSCULAR | Status: AC | PRN
  Administered 2018-09-05: 75 mL via INTRAVENOUS

## 2018-09-05 MED ORDER — INSULIN ASPART 100 UNIT/ML ~~LOC~~ SOLN: 0.0000 [IU] | Freq: Every day | SUBCUTANEOUS | Status: DC

## 2018-09-05 MED ORDER — ACETAMINOPHEN 325 MG PO TABS: 650.0000 mg | ORAL_TABLET | Freq: Four times a day (QID) | ORAL | Status: DC | PRN

## 2018-09-05 MED ORDER — ALBUTEROL SULFATE (2.5 MG/3ML) 0.083% IN NEBU
2.5000 mg | INHALATION_SOLUTION | Freq: Four times a day (QID) | RESPIRATORY_TRACT | Status: DC | PRN
  Administered 2018-09-10: 2.5 mg via RESPIRATORY_TRACT
  Filled 2018-09-05: qty 3

## 2018-09-05 MED ORDER — SODIUM CHLORIDE 0.9 % IV SOLN
INTRAVENOUS | Status: AC
  Administered 2018-09-05: 07:00:00 via INTRAVENOUS

## 2018-09-05 MED ORDER — HYDROCODONE-ACETAMINOPHEN 5-325 MG PO TABS
1.0000 | ORAL_TABLET | Freq: Four times a day (QID) | ORAL | Status: DC | PRN
  Administered 2018-09-05 – 2018-09-09 (×6): 1 via ORAL
  Filled 2018-09-05 (×8): qty 1

## 2018-09-05 MED ORDER — INSULIN ASPART 100 UNIT/ML ~~LOC~~ SOLN
0.0000 [IU] | Freq: Three times a day (TID) | SUBCUTANEOUS | Status: DC
  Administered 2018-09-09 (×2): 2 [IU] via SUBCUTANEOUS
  Filled 2018-09-05 (×2): qty 1

## 2018-09-05 MED ORDER — FERROUS SULFATE 325 (65 FE) MG PO TABS
325.0000 mg | ORAL_TABLET | Freq: Two times a day (BID) | ORAL | Status: DC
  Administered 2018-09-06 – 2018-09-10 (×8): 325 mg via ORAL
  Filled 2018-09-05 (×8): qty 1

## 2018-09-05 MED ORDER — SODIUM CHLORIDE 0.9 % IV BOLUS
1000.0000 mL | Freq: Once | INTRAVENOUS | Status: AC
  Administered 2018-09-05: 1000 mL via INTRAVENOUS

## 2018-09-05 MED ORDER — CARVEDILOL 6.25 MG PO TABS
6.2500 mg | ORAL_TABLET | Freq: Two times a day (BID) | ORAL | Status: DC
  Administered 2018-09-05 – 2018-09-10 (×10): 6.25 mg via ORAL
  Filled 2018-09-05 (×5): qty 2
  Filled 2018-09-05: qty 1
  Filled 2018-09-05: qty 2
  Filled 2018-09-05: qty 1
  Filled 2018-09-05: qty 2
  Filled 2018-09-05 (×2): qty 1

## 2018-09-05 MED ORDER — NEPRO/CARBSTEADY PO LIQD
237.0000 mL | Freq: Two times a day (BID) | ORAL | Status: DC
  Administered 2018-09-05 – 2018-09-10 (×5): 237 mL via ORAL

## 2018-09-05 MED ORDER — FUROSEMIDE 20 MG PO TABS
20.0000 mg | ORAL_TABLET | Freq: Every day | ORAL | Status: DC
  Administered 2018-09-05 – 2018-09-10 (×6): 20 mg via ORAL
  Filled 2018-09-05 (×6): qty 1

## 2018-09-05 MED ORDER — MORPHINE SULFATE (PF) 4 MG/ML IV SOLN
4.0000 mg | Freq: Once | INTRAVENOUS | Status: AC
  Administered 2018-09-05: 4 mg via INTRAVENOUS
  Filled 2018-09-05: qty 1

## 2018-09-05 MED ORDER — MOMETASONE FURO-FORMOTEROL FUM 200-5 MCG/ACT IN AERO
2.0000 | INHALATION_SPRAY | Freq: Two times a day (BID) | RESPIRATORY_TRACT | Status: DC
  Administered 2018-09-05 – 2018-09-10 (×8): 2 via RESPIRATORY_TRACT
  Filled 2018-09-05 (×2): qty 8.8

## 2018-09-05 MED ORDER — ONDANSETRON HCL 4 MG/2ML IJ SOLN: 4.0000 mg | Freq: Four times a day (QID) | INTRAMUSCULAR | Status: DC | PRN

## 2018-09-05 MED ORDER — POLYETHYLENE GLYCOL 3350 17 G PO PACK
17.0000 g | PACK | Freq: Every day | ORAL | Status: DC
  Administered 2018-09-05 – 2018-09-10 (×4): 17 g via ORAL
  Filled 2018-09-05 (×4): qty 1

## 2018-09-05 MED ORDER — IOPAMIDOL (ISOVUE-300) INJECTION 61%
15.0000 mL | INTRAVENOUS | Status: AC
  Administered 2018-09-05 (×2): 15 mL via ORAL

## 2018-09-05 MED ORDER — ADULT MULTIVITAMIN W/MINERALS CH
1.0000 | ORAL_TABLET | Freq: Every day | ORAL | Status: DC
  Administered 2018-09-06 – 2018-09-10 (×4): 1 via ORAL
  Filled 2018-09-05 (×4): qty 1

## 2018-09-05 NOTE — ED Notes (Signed)
Admitting doctor at bedside 

## 2018-09-05 NOTE — ED Provider Notes (Signed)
[facility] Emergency Department Provider Note   ____________________________________________   First MD Initiated Contact with Patient 09/05/18 0144     (approximate)  I have reviewed the triage vital signs and the nursing notes.   HISTORY  Chief Complaint Abdominal Pain    HPI Danielle [name] is a 62 y.o. female who presents to the ED from home with a chief complaint of abdominal pain, vomiting and diarrhea.  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  She was hospitalized from 10/29 to 11/2 for partial SBO and acute renal failure.  She received IV hydration and dilation of her end ileostomy at that time.  Presents with a 1 day history of pain around her ileostomy site associated with vomiting and increased loose output in her ostomy bag.  Denies associated fever, chills, chest pain, dysuria.  Has a history of COPD and reports some shortness of breath that started today.  Denies recent travel or trauma.  Takes Eliquis.   Past Medical History:  Diagnosis Date  . Anxiety   . Arthritis   . Cancer ([place]) left    breast cancer 2000, chemo tx's with total mastectomy and lymph nodes resected.   . Cancer of right lung ([place]) 02/21/2016   rad tx's.   . CHF (congestive heart failure) ([place])   . COPD (chronic obstructive pulmonary disease) ([place])   . Dependence on supplemental oxygen   . Depression   . Diabetes mellitus without complication ([place])   . Heart murmur   . Hypertension   . Lung nodule   . Lymphedema   . Shortness of breath dyspnea    with exertion  . Status post chemotherapy 2001   left breast cancer  . Status post radiation therapy 2001   left breast cancer    Patient Active Problem List   Diagnosis Date Noted  . Intractable nausea and vomiting 09/05/2018  . Abdominal pain 08/12/2018  . Reflux esophagitis   . Iron deficiency anemia secondary to blood loss (chronic)   . SBO (small bowel  obstruction) ([place]) 06/29/2018  . Chronic systolic heart failure ([place]) 06/09/2018  . HTN (hypertension) 06/09/2018  . Atrial fibrillation ([place]) 06/09/2018  . Lymphedema 06/09/2018  . ARF (acute renal failure) ([place]) 06/04/2018  . Protein-calorie malnutrition, severe 06/04/2018  . Blood in stool   . Focal (segmental) acute (reversible) ischemia of large intestine ([place])   . Ulceration of intestine   . Abdominal pain, right upper quadrant   . Diverticulosis of large intestine without diverticulitis   . Abnormal CT scan, colon   . Generalized abdominal pain   . Colitis   . COPD exacerbation ([place])   . Malnutrition of moderate degree 04/23/2018  . Palliative care by specialist   . DNR (do not resuscitate) discussion   . Weakness generalized   . Hyponatremia 02/10/2017  . Syncope 02/09/2017  . Lung nodule, solitary 07/25/2016  . Malignant neoplasm of upper lobe of right lung ([place]) 07/25/2016  . Carcinoma of overlapping sites of left breast in female, estrogen receptor positive ([place]) 06/22/2016  . Multiple lung nodules 06/22/2016  . Cough   . Lesion of right lung   . Breast cancer in female [facility]) 02/21/2016  . DOE (dyspnea on exertion) 02/14/2016  . Moderate COPD (chronic obstructive pulmonary disease) ([place]) 08/24/2014    Past Surgical History:  Procedure Laterality Date  . Breast Biospy Left    [facility]  . BREAST SURGERY    .  COLONOSCOPY N/A 04/30/2018   Procedure: COLONOSCOPY;  Surgeon: Virgel Manifold, MD;  Location: [facility] ENDOSCOPY;  Service: Endoscopy;  Laterality: N/A;  . COLONOSCOPY N/A 07/22/2018   Procedure: COLONOSCOPY;  Surgeon: Virgel Manifold, MD;  Location: [facility] ENDOSCOPY;  Service: Endoscopy;  Laterality: N/A;  . DILATION AND CURETTAGE OF UTERUS    . ELECTROMAGNETIC NAVIGATION BROCHOSCOPY Right 04/11/2016   Procedure: ELECTROMAGNETIC NAVIGATION BRONCHOSCOPY;  Surgeon: Vilinda Boehringer, MD;  Location: [facility] ORS;  Service: Cardiopulmonary;  Laterality: Right;  .  ESOPHAGOGASTRODUODENOSCOPY N/A 07/22/2018   Procedure: ESOPHAGOGASTRODUODENOSCOPY (EGD);  Surgeon: Virgel Manifold, MD;  Location: [facility] ENDOSCOPY;  Service: Endoscopy;  Laterality: N/A;  . ESOPHAGOGASTRODUODENOSCOPY (EGD) WITH PROPOFOL N/A 05/07/2018   Procedure: ESOPHAGOGASTRODUODENOSCOPY (EGD) WITH PROPOFOL;  Surgeon: Lucilla Lame, MD;  Location: [facility] ENDOSCOPY;  Service: Endoscopy;  Laterality: N/A;  . history of colonoscopy]    . ILEOSCOPY N/A 07/22/2018   Procedure: ILEOSCOPY THROUGH STOMA;  Surgeon: Virgel Manifold, MD;  Location: [facility] ENDOSCOPY;  Service: Endoscopy;  Laterality: N/A;  . ILEOSTOMY    . ILEOSTOMY CLOSURE N/A 08/15/2018   Procedure: DILATION OF ILEOSTOMY STRICTURE;  Surgeon: Herbert Pun, MD;  Location: [facility] ORS;  Service: General;  Laterality: N/A;  . LAPAROTOMY Right 05/04/2018   Procedure: EXPLORATORY LAPAROTOMY right colectomy right and left ostomy;  Surgeon: Herbert Pun, MD;  Location: [facility] ORS;  Service: General;  Laterality: Right;  . LUNG BIOPSY    . MASTECTOMY Left    2000, [facility]  . ROTATOR CUFF REPAIR Right    [facility]    Prior to Admission medications   Medication Sig Start Date End Date Taking? Authorizing Provider  albuterol (PROVENTIL HFA;VENTOLIN HFA) 108 (90 Base) MCG/ACT inhaler Inhale 2 puffs into the lungs every 6 (six) hours as needed for wheezing or shortness of breath. 04/24/18  Yes Demetrios Loll, MD  apixaban (ELIQUIS) 5 MG TABS tablet Take 1 tablet (5 mg total) by mouth 2 (two) times daily. 06/23/18  Yes [name], MD  budesonide-formoterol (SYMBICORT) 160-4.5 MCG/ACT inhaler Inhale 2 puffs into the lungs 2 (two) times daily. 04/24/18  Yes Demetrios Loll, MD  carvedilol (COREG) 6.25 MG tablet Take 1 tablet (6.25 mg total) by mouth 2 (two) times daily. 06/24/18  Yes Hackney, Tina A, FNP  citalopram (CELEXA) 40 MG tablet Take 40 mg by mouth daily.   Yes [provider]  ferrous sulfate 325 (65 FE) MG tablet Take 1 tablet  (325 mg total) by mouth 2 (two) times daily with a meal. 07/02/18  Yes [name], MD  furosemide (LASIX) 20 MG tablet Take 20 mg by mouth daily.    Yes [provider]  megestrol (MEGACE) 400 MG/10ML suspension Take 7.5 mLs (300 mg total) by mouth 2 (two) times daily. 06/05/18  Yes Fritzi Mandes, MD  Multiple Vitamin (MULTIVITAMIN WITH MINERALS) TABS tablet Take 1 tablet by mouth daily. 06/06/18  Yes Fritzi Mandes, MD  pantoprazole (PROTONIX) 40 MG tablet Take 1 tablet (40 mg total) by mouth 2 (two) times daily before a meal. 07/23/18  Yes Sudini, Srikar, MD  Potassium (POTASSIMIN PO) Take 1 tablet by mouth daily as needed.   Yes [provider]  HYDROcodone-acetaminophen (NORCO/VICODIN) 5-325 MG tablet Take 1 tablet by mouth every 6 (six) hours as needed for moderate pain or severe pain.    [provider]  traMADol (ULTRAM) 50 MG tablet Take 50 mg by mouth every 6 (six) hours as needed. for pain 07/28/18   [provider]  Allergies Patient has no known allergies.  Family History  Problem Relation Age of Onset  . Breast cancer Mother 87  . Cancer Mother        Breast   . Cirrhosis Father   . Breast cancer Paternal Aunt 48  . Cancer Maternal Aunt        Breast     Social History Social History   Tobacco Use  . Smoking status: Former Smoker    Packs/day: 0.50    Years: 20.00    Pack years: 10.00    Types: Cigarettes    Last attempt to quit: 07/02/2012    Years since quitting: 6.1  . Smokeless tobacco: Never Used  . Tobacco comment: quit 2014  Substance Use Topics  . Alcohol use: Yes    Comment: Occasionally  . Drug use: No    Review of Systems  Constitutional: No fever/chills Eyes: No visual changes. ENT: No sore throat. Cardiovascular: Denies chest pain. Respiratory: Positive for shortness of breath. Gastrointestinal: Positive for abdominal pain, nausea, vomiting and diarrhea.  No constipation. Genitourinary: Negative for  dysuria. Musculoskeletal: Negative for back pain. Skin: Negative for rash. Neurological: Negative for headaches, focal weakness or numbness.   ____________________________________________   PHYSICAL EXAM:  VITAL SIGNS: ED Triage Vitals  Enc Vitals Group     BP 09/04/18 1940 (!) 139/97     Pulse Rate 09/04/18 1940 89     Resp 09/04/18 1940 18     Temp 09/04/18 1940 98.3 F (36.8 C)     Temp Source 09/04/18 1940 Oral     SpO2 09/04/18 1940 99 %     Weight 09/04/18 1938 119 lb (54 kg)     Height 09/04/18 1938 5\' 7"  (1.702 m)     Head Circumference --      Peak Flow --      Pain Score 09/04/18 1937 10     Pain Loc --      Pain Edu? --      Excl. in [place]? --     Constitutional: Alert and oriented. Well appearing and in mild to moderate acute distress. Eyes: Conjunctivae are normal. PERRL. EOMI. Head: Atraumatic. Nose: No congestion/rhinnorhea. Mouth/Throat: Mucous membranes are moist.  Oropharynx non-erythematous. Neck: No stridor.   Cardiovascular: Normal rate, regular rhythm. Grossly normal heart sounds.  Good peripheral circulation. Respiratory: Normal respiratory effort.  No retractions. Lungs slightly diminished bibasilarly. Gastrointestinal: Soft and mildly tender to palpation right lower quadrant/ileostomy site without rebound or guarding. No distention. No abdominal bruits. No CVA tenderness. Musculoskeletal: No lower extremity tenderness nor edema.  No joint effusions. Neurologic:  Normal speech and language. No gross focal neurologic deficits are appreciated.  Skin:  Skin is warm, dry and intact. No rash noted. Psychiatric: Mood and affect are normal. Speech and behavior are normal.  ____________________________________________   LABS (all labs ordered are listed, but only abnormal results are displayed)  Labs Reviewed  COMPREHENSIVE METABOLIC PANEL - Abnormal; Notable for the following components:      Result Value   CO2 17 (*)    Creatinine, Ser 1.21 (*)     GFR calc non Af Amer 46 (*)    GFR calc Af Amer 54 (*)    All other components within normal limits  CBC - Abnormal; Notable for the following components:   RBC 3.32 (*)    Hemoglobin 9.8 (*)    HCT 30.6 (*)    All other components within normal limits  BRAIN NATRIURETIC  PEPTIDE - Abnormal; Notable for the following components:   B Natriuretic Peptide 182.0 (*)    All other components within normal limits  C DIFFICILE QUICK SCREEN W PCR REFLEX  GASTROINTESTINAL PANEL BY PCR, STOOL (REPLACES STOOL CULTURE)  LIPASE, BLOOD  TROPONIN I  URINALYSIS, COMPLETE (UACMP) WITH MICROSCOPIC   ____________________________________________  EKG  ED ECG REPORT I, Cathren Sween J, the attending physician, personally viewed and interpreted this ECG.   Date: 09/05/2018  EKG Time: 1944  Rate: 99  Rhythm: normal EKG, normal sinus rhythm  Axis: Normal  Intervals:none  ST&T Change: Nonspecific  ____________________________________________  RADIOLOGY  ED MD interpretation:  Mild interstitial edema; CT demonstrates possible SBO versus enteritis  Official radiology report(s): Dg Chest 1 View  Result Date: 09/04/2018 CLINICAL DATA:  Abdominal pain starting today. EXAM: CHEST  1 VIEW COMPARISON:  07/05/2018 FINDINGS: Stable right upper lobe scarring. Heart and mediastinal contours are stable with moderate aortic atherosclerosis of the thoracic aorta. Mild interstitial edema is noted. Subcortical cystic change about both humeral heads right greater than left. No acute nor suspicious osseous abnormalities. IMPRESSION: Mild interstitial edema and right upper lobe scarring. Electronically Signed   By: Ashley Royalty M.D.   On: 09/04/2018 20:22   Ct Abdomen Pelvis W Contrast  Result Date: 09/05/2018 CLINICAL DATA:  Lower abdominal pain starting today. Vomiting. History of COPD on home oxygen as needed. EXAM: CT ABDOMEN AND PELVIS WITH CONTRAST TECHNIQUE: Multidetector CT imaging of the abdomen and pelvis was  performed using the standard protocol following bolus administration of intravenous contrast. CONTRAST:  79mL OMNIPAQUE IOHEXOL 300 MG/ML  SOLN COMPARISON:  08/12/2018 FINDINGS: Lower chest: Lung bases are clear. Hepatobiliary: 2 small cysts in the liver. No other focal lesions. Gallbladder and bile ducts are unremarkable. Pancreas: Unremarkable. No pancreatic ductal dilatation or surrounding inflammatory changes. Spleen: Normal in size without focal abnormality. Adrenals/Urinary Tract: Adrenal glands are unremarkable. Kidneys are normal, without renal calculi, focal lesion, or hydronephrosis. Bladder is unremarkable. Stomach/Bowel: Small bowel are dilated and fluid-filled all the way to a right lower quadrant ileostomy. No transition zone is identified. This could represent early obstruction possibly at the level of the ileostomy, stasis, or enteritis. Left lower quadrant colostomy with decompressed colon. Probable right hemicolectomy. Vascular/Lymphatic: Extensive vascular calcifications around the aorta and abdominal branch vessels. Prominent calcification in the superior mesenteric artery likely represents areas of stenosis although the vessel appears patent. Reproductive: Uterus and ovaries are not enlarged. Hyperdense lesion arising from the left uterine fundus may represent exophytic fibroid. Other: No free air or free fluid in the abdomen. Scarring in the anterior abdominal wall is likely postoperative. Musculoskeletal: Mild anterior compression of L4 without change. IMPRESSION: 1. Small bowel are dilated and fluid-filled proximal to a right lower quadrant ileostomy. No transition zone is identified. This could represent early obstruction at the level of the ileostomy, stasis, or enteritis. 2. Extensive vascular calcifications. Prominent calcification in the superior mesenteric artery likely represents areas of stenosis although the vessel appears patent. 3. Mildly hyperdense lesion arising from the left  uterine fundus may represent exophytic fibroid. Electronically Signed   By: Lucienne Capers M.D.   On: 09/05/2018 04:25    ____________________________________________   PROCEDURES  Procedure(s) performed: None  Procedures  Critical Care performed: Yes, see critical care note(s)   CRITICAL CARE Performed by: Paulette Blanch   Total critical care time: 45 minutes  Critical care time was exclusive of separately billable procedures and treating other patients.  Critical care was  necessary to treat or prevent imminent or life-threatening deterioration.  Critical care was time spent personally by me on the following activities: development of treatment plan with patient and/or surrogate as well as nursing, discussions with consultants, evaluation of patient's response to treatment, examination of patient, obtaining history from patient or surrogate, ordering and performing treatments and interventions, ordering and review of laboratory studies, ordering and review of radiographic studies, pulse oximetry and re-evaluation of patient's condition.  ____________________________________________   INITIAL IMPRESSION / ASSESSMENT AND PLAN / ED COURSE  As part of my medical decision making, I reviewed the following data within the [place] History obtained from family, Nursing notes reviewed and incorporated, Labs reviewed, Old chart reviewed, Radiograph reviewed, and Notes from prior ED visits   62 year old female with ileostomy and history of recurrent SBO who presents with abdominal pain, nausea/vomiting and loose stool in her ostomy bag. Differential diagnosis includes, but is not limited to, ovarian cyst, ovarian torsion, acute appendicitis, diverticulitis, urinary tract infection/pyelonephritis, endometriosis, bowel obstruction, colitis, renal colic, gastroenteritis, hernia, fibroids, endometriosis, etc.   She is afebrile, normal WBC, mild renal insufficiency, mildly  elevated BNP.  Will check C. difficile, stool cultures.  Proceed with CT abdomen/pelvis to evaluate for intra-abdominal etiologies.  Initiate IV fluid resuscitation, 4 mg IV morphine for pain paired with 4 mg IV Zofran for nausea. Will reassess.   Clinical Course as of Sep 05 658  Fri Sep 05, 2018  9326 ED course: Stool studies were negative.  CT scan and case discussed with general surgeon on-call Dr. Hampton Abbot.  Surgery will follow the case with hospitalist services.  Discussed with hospitalist who evaluated patient in the emergency department for admission.   [JS]    Clinical Course User Index [JS] Paulette Blanch, MD     ____________________________________________   FINAL CLINICAL IMPRESSION(S) / ED DIAGNOSES  Final diagnoses:  Generalized abdominal pain  SBO (small bowel obstruction) [facility])     ED Discharge Orders    None       Note:  This document was prepared using Dragon voice recognition software and may include unintentional dictation errors.    Paulette Blanch, MD 09/05/18 [phone]

## 2018-09-05 NOTE — Progress Notes (Signed)
This nurse put order in for wound nurse consult per Dr. Posey Pronto. Ostomy continually leaks.

## 2018-09-05 NOTE — Progress Notes (Signed)
Patient ID: Danielle [name], female   DOB: 1955-11-27, 62 y.o.   MRN: [id number] patient seen and examined. Complains of abdominal pain. She had vomiting yesterday. No more vomiting. Tolerating some clears. Seen by surgery. await recommendations. Per RN ileostomy site leaking. Will get wound ostomy nurse to see

## 2018-09-05 NOTE — ED Notes (Signed)
Patient transported to CT 

## 2018-09-05 NOTE — Care Management (Signed)
Patient admitted with abdominal.  Patient with history of ostomy, and multiple SBO.  Patient lives at home with daughter.  Patient has RW and BSC in the home.  RNCM confirmed with [place] that patient has home O2 at 2L continuous.   Patient previously had has charity home health services through [place].  Patient now has Medicaid. If abdominal pain resolves there shouldn't be an indication for home health nursing.  Patient is able to empty the bags, and her daughter performs the pouch changes per my previous assessment.

## 2018-09-05 NOTE — Plan of Care (Signed)
  Problem: Health Behavior/Discharge Planning: Goal: Ability to manage health-related needs will improve Outcome: Progressing   Problem: Clinical Measurements: Goal: Ability to maintain clinical measurements within normal limits will improve Outcome: Progressing Goal: Will remain free from infection Outcome: Progressing   Problem: Activity: Goal: Risk for activity intolerance will decrease Outcome: Progressing   Problem: Nutrition: Goal: Adequate nutrition will be maintained Outcome: Progressing   Problem: Coping: Goal: Level of anxiety will decrease Outcome: Progressing   Problem: Pain Managment: Goal: General experience of comfort will improve Outcome: Progressing   Problem: Safety: Goal: Ability to remain free from injury will improve Outcome: Progressing   Problem: Skin Integrity: Goal: Risk for impaired skin integrity will decrease Outcome: Progressing

## 2018-09-05 NOTE — Consult Note (Signed)
SURGICAL CONSULTATION NOTE   HISTORY OF PRESENT ILLNESS (HPI):  62 y.o. female presented to [facility] ED for evaluation of abdominal pain, nausea and vomiting. Patient reports started with abdominal pain yesterday morning. The patient refers pain is on ileostomy side. Pain does not radiates. Pain is aggravated with pressure on area. Pain is improved with passage of stool. Patient refers that she has had this pain before that is aggravated when the stool is more solid and is improved when the stool is watery. Refers associated nausea and vomiting. Denies fever or chills.   Surgery is consulted by Dr. Posey Pronto in this context for evaluation and management of ileostomy pain.  PAST MEDICAL HISTORY (PMH):  Past Medical History:  Diagnosis Date  . Anxiety   . Arthritis   . Cancer ([place]) left    breast cancer 2000, chemo tx's with total mastectomy and lymph nodes resected.   . Cancer of right lung ([place]) 02/21/2016   rad tx's.   . CHF (congestive heart failure) ([place])   . COPD (chronic obstructive pulmonary disease) ([place])   . Dependence on supplemental oxygen   . Depression   . Diabetes mellitus without complication ([place])   . Heart murmur   . Hypertension   . Lung nodule   . Lymphedema   . Shortness of breath dyspnea    with exertion  . Status post chemotherapy 2001   left breast cancer  . Status post radiation therapy 2001   left breast cancer     PAST SURGICAL HISTORY ([place]):  Past Surgical History:  Procedure Laterality Date  . Breast Biospy Left    [facility]  . BREAST SURGERY    . COLONOSCOPY N/A 04/30/2018   Procedure: COLONOSCOPY;  Surgeon: Virgel Manifold, MD;  Location: [facility] ENDOSCOPY;  Service: Endoscopy;  Laterality: N/A;  . COLONOSCOPY N/A 07/22/2018   Procedure: COLONOSCOPY;  Surgeon: Virgel Manifold, MD;  Location: [facility] ENDOSCOPY;  Service: Endoscopy;  Laterality: N/A;  . DILATION AND CURETTAGE OF UTERUS    . ELECTROMAGNETIC NAVIGATION BROCHOSCOPY Right 04/11/2016    Procedure: ELECTROMAGNETIC NAVIGATION BRONCHOSCOPY;  Surgeon: Vilinda Boehringer, MD;  Location: [facility] ORS;  Service: Cardiopulmonary;  Laterality: Right;  . ESOPHAGOGASTRODUODENOSCOPY N/A 07/22/2018   Procedure: ESOPHAGOGASTRODUODENOSCOPY (EGD);  Surgeon: Virgel Manifold, MD;  Location: [facility] ENDOSCOPY;  Service: Endoscopy;  Laterality: N/A;  . ESOPHAGOGASTRODUODENOSCOPY (EGD) WITH PROPOFOL N/A 05/07/2018   Procedure: ESOPHAGOGASTRODUODENOSCOPY (EGD) WITH PROPOFOL;  Surgeon: Lucilla Lame, MD;  Location: [facility] ENDOSCOPY;  Service: Endoscopy;  Laterality: N/A;  . history of colonoscopy]    . ILEOSCOPY N/A 07/22/2018   Procedure: ILEOSCOPY THROUGH STOMA;  Surgeon: Virgel Manifold, MD;  Location: [facility] ENDOSCOPY;  Service: Endoscopy;  Laterality: N/A;  . ILEOSTOMY    . ILEOSTOMY CLOSURE N/A 08/15/2018   Procedure: DILATION OF ILEOSTOMY STRICTURE;  Surgeon: Herbert Pun, MD;  Location: [facility] ORS;  Service: General;  Laterality: N/A;  . LAPAROTOMY Right 05/04/2018   Procedure: EXPLORATORY LAPAROTOMY right colectomy right and left ostomy;  Surgeon: Herbert Pun, MD;  Location: [facility] ORS;  Service: General;  Laterality: Right;  . LUNG BIOPSY    . MASTECTOMY Left    2000, [facility]  . ROTATOR CUFF REPAIR Right    [facility]     MEDICATIONS:  Prior to Admission medications   Medication Sig Start Date End Date Taking? Authorizing Provider  albuterol (PROVENTIL HFA;VENTOLIN HFA) 108 (90 Base) MCG/ACT inhaler Inhale 2 puffs into the lungs every 6 (six) hours as needed for wheezing or shortness  of breath. 04/24/18  Yes Demetrios Loll, MD  apixaban (ELIQUIS) 5 MG TABS tablet Take 1 tablet (5 mg total) by mouth 2 (two) times daily. 06/23/18  Yes [name], MD  budesonide-formoterol (SYMBICORT) 160-4.5 MCG/ACT inhaler Inhale 2 puffs into the lungs 2 (two) times daily. 04/24/18  Yes Demetrios Loll, MD  carvedilol (COREG) 6.25 MG tablet Take 1 tablet (6.25 mg total) by mouth 2 (two) times daily. 06/24/18   Yes Hackney, Tina A, FNP  citalopram (CELEXA) 40 MG tablet Take 40 mg by mouth daily.   Yes [provider]  ferrous sulfate 325 (65 FE) MG tablet Take 1 tablet (325 mg total) by mouth 2 (two) times daily with a meal. 07/02/18  Yes [name], MD  furosemide (LASIX) 20 MG tablet Take 20 mg by mouth daily.    Yes [provider]  megestrol (MEGACE) 400 MG/10ML suspension Take 7.5 mLs (300 mg total) by mouth 2 (two) times daily. 06/05/18  Yes Fritzi Mandes, MD  Multiple Vitamin (MULTIVITAMIN WITH MINERALS) TABS tablet Take 1 tablet by mouth daily. 06/06/18  Yes Fritzi Mandes, MD  pantoprazole (PROTONIX) 40 MG tablet Take 1 tablet (40 mg total) by mouth 2 (two) times daily before a meal. 07/23/18  Yes Sudini, Srikar, MD  Potassium (POTASSIMIN PO) Take 1 tablet by mouth daily as needed.   Yes [provider]  HYDROcodone-acetaminophen (NORCO/VICODIN) 5-325 MG tablet Take 1 tablet by mouth every 6 (six) hours as needed for moderate pain or severe pain.    [provider]  traMADol (ULTRAM) 50 MG tablet Take 50 mg by mouth every 6 (six) hours as needed. for pain 07/28/18   [provider]     ALLERGIES:  No Known Allergies   SOCIAL HISTORY:  Social History   Socioeconomic History  . Marital status: Divorced    Spouse name: Not on file  . Number of children: Not on file  . Years of education: Not on file  . Highest education level: Not on file  Occupational History  . Not on file  Social Needs  . Financial resource strain: Hard  . Food insecurity:    Worry: Often true    Inability: Often true  . Transportation needs:    Medical: No    Non-medical: No  Tobacco Use  . Smoking status: Former Smoker    Packs/day: 0.50    Years: 20.00    Pack years: 10.00    Types: Cigarettes    Last attempt to quit: 07/02/2012    Years since quitting: 6.1  . Smokeless tobacco: Never Used  . Tobacco comment: quit 2014  Substance and Sexual Activity  .  Alcohol use: Yes    Comment: Occasionally  . Drug use: No  . Sexual activity: Never  Lifestyle  . Physical activity:    Days per week: 2 days    Minutes per session: 30 min  . Stress: Not at all  Relationships  . Social connections:    Talks on phone: More than three times a week    Gets together: More than three times a week    Attends religious service: 1 to 4 times per year    Active member of club or organization: No    Attends meetings of clubs or organizations: Never    Relationship status: Divorced  . Intimate partner violence:    Fear of current or ex partner: No    Emotionally abused: No    Physically abused: No  Forced sexual activity: No  Other Topics Concern  . Not on file  Social History Narrative  . Not on file    The patient currently resides (home / rehab facility / nursing home): Home The patient normally is (ambulatory / bedbound): Ambulatory   FAMILY HISTORY:  Family History  Problem Relation Age of Onset  . Breast cancer Mother 32  . Cancer Mother        Breast   . Cirrhosis Father   . Breast cancer Paternal Aunt 32  . Cancer Maternal Aunt        Breast      REVIEW OF SYSTEMS:  Constitutional: denies weight loss, fever, chills, or sweats  Eyes: denies any other vision changes, history of eye injury  ENT: denies sore throat, hearing problems  Respiratory: denies shortness of breath, wheezing  Cardiovascular: denies chest pain, palpitations  Gastrointestinal: positive abdominal pain, Nausea and vomitng Genitourinary: denies burning with urination or urinary frequency Musculoskeletal: denies any other joint pains or cramps  Skin: denies any other rashes or skin discolorations  Neurological: denies any other headache, dizziness, weakness  Psychiatric: denies any other depression, anxiety   All other review of systems were negative   VITAL SIGNS:  Temp:  [97.9 F (36.6 C)-98.8 F (37.1 C)] 98.8 F (37.1 C) (11/22 0741) Pulse Rate:   [82-96] 82 (11/22 0741) Resp:  [15-21] 18 (11/22 0741) BP: (112-181)/(72-105) 112/72 (11/22 0741) SpO2:  [97 %-99 %] 98 % (11/22 0741) Weight:  [54 kg] 54 kg (11/21 1938)     Height: 5\' 7"  (170.2 cm) Weight: 54 kg BMI (Calculated): 18.63   INTAKE/OUTPUT:  This shift: No intake/output data recorded.  Last 2 shifts: @IOLAST2SHIFTS @   PHYSICAL EXAM:  Constitutional:  -- Normal body habitus  -- Awake, alert, and oriented x3  Eyes:  -- Pupils equally round and reactive to light  -- No scleral icterus  Ear, nose, and throat:  -- No jugular venous distension  Pulmonary:  -- No crackles  -- Equal breath sounds bilaterally -- Breathing non-labored at rest Cardiovascular:  -- S1, S2 present  -- No pericardial rubs Gastrointestinal:  -- Abdomen soft, tender on right lower quadrant. Non distended. Ileostomy retracted and stricturing again.  Musculoskeletal and Integumentary:  -- Wounds or skin discoloration: None appreciated -- Extremities: B/L UE and LE FROM, hands and feet warm, no edema  Neurologic:  -- Motor function: intact and symmetric -- Sensation: intact and symmetric   Labs:  CBC Latest Ref Rng & Units 09/04/2018 09/02/2018 08/15/2018  WBC 4.0 - 10.5 K/uL 8.4 8.4 5.6  Hemoglobin 12.0 - 15.0 g/dL 9.8(L) 9.7(L) 8.2(L)  Hematocrit 36.0 - 46.0 % 30.6(L) 30.7(L) 25.2(L)  Platelets 150 - 400 K/uL 352 341 277   CMP Latest Ref Rng & Units 09/04/2018 09/02/2018 08/15/2018  Glucose 70 - 99 mg/dL 94 79 91  BUN 8 - 23 mg/dL 19 18 17   Creatinine 0.44 - 1.00 mg/dL 1.21(H) 1.11(H) 1.14(H)  Sodium 135 - 145 mmol/L 136 136 134(L)  Potassium 3.5 - 5.1 mmol/L 3.9 4.0 4.1  Chloride 98 - 111 mmol/L 109 113(H) 111  CO2 22 - 32 mmol/L 17(L) 14(L) 17(L)  Calcium 8.9 - 10.3 mg/dL 9.8 9.5 9.0  Total Protein 6.5 - 8.1 g/dL 7.9 7.3 -  Total Bilirubin 0.3 - 1.2 mg/dL 0.6 0.6 -  Alkaline Phos 38 - 126 U/L 72 59 -  AST 15 - 41 U/L 20 17 -  ALT 0 - 44 U/L 18  14 -    Imaging studies:   EXAM: CT ABDOMEN AND PELVIS WITH CONTRAST  TECHNIQUE: Multidetector CT imaging of the abdomen and pelvis was performed using the standard protocol following bolus administration of intravenous contrast.  CONTRAST:  37mL OMNIPAQUE IOHEXOL 300 MG/ML  SOLN  COMPARISON:  08/12/2018  FINDINGS: Lower chest: Lung bases are clear.  Hepatobiliary: 2 small cysts in the liver. No other focal lesions. Gallbladder and bile ducts are unremarkable.  Pancreas: Unremarkable. No pancreatic ductal dilatation or surrounding inflammatory changes.  Spleen: Normal in size without focal abnormality.  Adrenals/Urinary Tract: Adrenal glands are unremarkable. Kidneys are normal, without renal calculi, focal lesion, or hydronephrosis. Bladder is unremarkable.  Stomach/Bowel: Small bowel are dilated and fluid-filled all the way to a right lower quadrant ileostomy. No transition zone is identified. This could represent early obstruction possibly at the level of the ileostomy, stasis, or enteritis. Left lower quadrant colostomy with decompressed colon. Probable right hemicolectomy.  Vascular/Lymphatic: Extensive vascular calcifications around the aorta and abdominal branch vessels. Prominent calcification in the superior mesenteric artery likely represents areas of stenosis although the vessel appears patent.  Reproductive: Uterus and ovaries are not enlarged. Hyperdense lesion arising from the left uterine fundus may represent exophytic fibroid.  Other: No free air or free fluid in the abdomen. Scarring in the anterior abdominal wall is likely postoperative.  Musculoskeletal: Mild anterior compression of L4 without change.  IMPRESSION: 1. Small bowel are dilated and fluid-filled proximal to a right lower quadrant ileostomy. No transition zone is identified. This could represent early obstruction at the level of the ileostomy, stasis, or enteritis. 2. Extensive vascular  calcifications. Prominent calcification in the superior mesenteric artery likely represents areas of stenosis although the vessel appears patent. 3. Mildly hyperdense lesion arising from the left uterine fundus may represent exophytic fibroid.   Electronically Signed   By: Lucienne Capers M.D.   On: 09/05/2018 04:25  Assessment/Plan:  62 y.o.femalewithintermittent episodes ofsmall bowel obstructions/pright hemicolectomy with end ileostomy and mucus fistula(05/04/18) for bleeding hepatic flexure, complicated by pertinent comorbidities includingCHF, atrial fibrillation on anticoagulation, acute renal failure, COPD, history of lung and breast cancer. Patient had dilation of the ileostomy and did good until yesterday. On my physical exam the area of the ileostomy is having stricture again and this may be the cause of the pain. The patient refers that when the stool is softer she does not has pain.  I agree to start with clear liquid diet and I added Miralax to see if this make stool watery and helps with pain. Will follow closely and assist in management.    Arnold Long, MD

## 2018-09-05 NOTE — Consult Note (Signed)
[place] Nurse ostomy follow up Patient receiving care in [facility].  No family present. Stoma type/location: RLQ ileostomy that is severely stenosed.  There is barely an opening visible.  It is a very small hole.  No stoma visible. Stomal assessment/size: No stoma present. Peristomal assessment: Raw, irritated from effluent leakage Treatment options for stomal/peristomal skin: Crusting with stoma powder and skin prep pads, and barrier rings Output:  Thin effluent.  I emptied 300 ml of liquid today, and there was approximately 40 ml of pudding consistency that I could not get into a measuring device. Ostomy pouching: 2pc., flat, 2 and 1/4 inch I explained to the patient that as long as she doesn't have a true stoma for the stool to exit through, she will always have leakage problems, and as along as there is such a small hole for the stool to exit through, she will always have problems with obstruction and pain.  Hopefully she will see her way to having a surgical revision to improve her condition, reduce her complications, and improve her ability to perform self care and pouching.  I explained that it is okay, as a consumer of healthcare, to seek a second opinion in such complicated cases should she desire to do so.  Follow these instructions for ostomy site care. 1.  Remove entire pouching system (barrier and pouch) whenever it leaks 2.  Cleanse around the hole with plain water. 3.  Cut the new skin barrier opening to fit around the hole and get a pouch ready to attach to it. 4.  Sprinkle Stoma powder over raw skin, spread with your gloved finger. 5.  DAB (don't rub, just dab) the powder with skin prep pad. 6.  Allow the skin prep to dry completely. 7.  Pull a barrier ring apart. Place a piece of it into the crease on the patient's left side of the hole 8.  Place the remaining piece below the hole. 9.  Place the cut barrier over the barrier ring. 10. Attach the pouch to the barrier.   A separate  order was entered for ostomy supplies. Val Riles, RN, MSN, CWOCN, CNS-BC, pager [phone]

## 2018-09-05 NOTE — Progress Notes (Signed)
Initial Nutrition Assessment  DOCUMENTATION CODES:   Non-severe (moderate) malnutrition in context of chronic illness  INTERVENTION:   Nepro Shake po BID, each supplement provides 425 kcal and 19 grams protein  MVI daily  NUTRITION DIAGNOSIS:   Moderate Malnutrition related to chronic illness(breast and lung cancer, COPD, CHF, multiple admits for SBO) as evidenced by moderate fat depletions, moderate to severe muscle depletions.  GOAL:   Patient will meet greater than or equal to 90% of their needs  MONITOR:   Labs, Weight trends, Skin, I & O's, PO intake, Supplement acceptance  REASON FOR ASSESSMENT:   Other (Comment)(low BMI)    ASSESSMENT:   62 y.o. 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    Patient is well known to nutrition department and this RD from multiple previous admits. Pt required TPN 7/19-8/3 and was intubated 7/21-7/25 due to acute respiratory failure following operation. On that admission patient became volume overloaded from TPN; she gained 25 lbs, developed SOB and required a reduced TPN rate and diuresis. She was also noncompliant with nutrition supplements during that admission. She did have improvement of her appetite at discharge, but was still not eating adequately. Pt with continued decreased appetite and oral intake. Pt reports drinking Ensure at home but patient prefers butter pecan Nepro. Pt has continued to loose weight since her surgery. Pt has lost 14lbs(10%) over the past 3 months; this is significant. Pt's weight from this admit appears reported so unsure if any additional weight loss. Pt had dilation of the ileostomy  during last admit. Pt was eating 90% of meals at discharge. RD will order supplements and MVI to help pt meet her estimated needs. RD will liberalize diet and add phosphorus and sodium restrictions.   Medications reviewed and include: celexa, ferrous sulfate, lasix, megace, MVI, protonix  Labs reviewed: K 3.9 wnl- 11/21 P 5.4(H), Mg 2.1 Hgb 9.8(L), Hct 30.6(L)  NUTRITION - FOCUSED PHYSICAL EXAM:    Most Recent Value  Orbital Region  Moderate depletion  Upper Arm Region  Mild depletion  Thoracic and Lumbar Region  Moderate depletion  Buccal Region  Moderate depletion  Temple Region  Moderate depletion  Clavicle Bone Region  Moderate depletion  Clavicle and Acromion Bone Region  Moderate depletion  Scapular Bone Region  Mild depletion  Dorsal Hand  Moderate depletion  Patellar Region  Severe depletion  Anterior Thigh Region  Severe depletion  Posterior Calf Region  Severe depletion  Edema (RD Assessment)  None  Hair  Reviewed  Eyes  Reviewed  Mouth  Reviewed  Skin  Reviewed  Nails  Reviewed     Diet Order:   Diet Order            Diet renal/carb modified with fluid restriction Diet-HS Snack? Nothing; Room service appropriate? Yes; Fluid consistency: Thin  Diet effective now             EDUCATION NEEDS:   No education needs have been identified at this time  Skin:  Skin Assessment: Reviewed RN Assessment  Last BM:  11/22- small amount via ostomy   Height:   Ht Readings from Last 1 Encounters:  09/04/18 5\' 7"  (1.702 m)    Weight:   Wt Readings from Last 1 Encounters:  09/04/18 54 kg    Ideal Body Weight:  61.4 kg  BMI:  Body mass index is 18.64 kg/m.  Estimated Nutritional Needs:   Kcal:  1500-1800kcal/day   Protein:  75-85g/day   Fluid:  >1.5L/day   [name] MS, RD, LDN Pager #- [phone] Office#- [phone] After Hours Pager: [phone]

## 2018-09-05 NOTE — H&P (Signed)
[facility] NAME: Danielle [name]    MR#:  [id number]  DATE OF BIRTH:  [birth year]  DATE OF ADMISSION:  09/05/2018  PRIMARY CARE PHYSICIAN: Elisabeth Cara, NP   REQUESTING/REFERRING PHYSICIAN: Paulette Blanch, MD  CHIEF COMPLAINT:   Chief Complaint  Patient presents with  . Abdominal Pain    HISTORY OF PRESENT ILLNESS:  Danielle [name]  is a 62 y.o. female with a known history of Danielle [name]  is a 62 y.o. 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 Pt has a complex Hx, and has had multiple hospital admissions in the last 4 months for the same reason. She returns for the same reason. Ostomy has stool. Surgery called from ED.  PAST MEDICAL HISTORY:   Past Medical History:  Diagnosis Date  . Anxiety   . Arthritis   . Cancer ([place]) left    breast cancer 2000, chemo tx's with total mastectomy and lymph nodes resected.   . Cancer of right lung ([place]) 02/21/2016   rad tx's.   . CHF (congestive heart failure) ([place])   . COPD (chronic obstructive pulmonary disease) ([place])   . Dependence on supplemental oxygen   . Depression   . Diabetes mellitus without complication ([place])   . Heart murmur   . Hypertension   . Lung nodule   . Lymphedema   . Shortness of breath dyspnea    with exertion  . Status post chemotherapy 2001   left breast cancer  . Status post radiation therapy 2001   left breast cancer    PAST SURGICAL HISTORY:   Past Surgical History:  Procedure Laterality Date  . Breast Biospy Left    [facility]  . BREAST SURGERY    . COLONOSCOPY N/A 04/30/2018   Procedure: COLONOSCOPY;  Surgeon:  Virgel Manifold, MD;  Location: [facility] ENDOSCOPY;  Service: Endoscopy;  Laterality: N/A;  . COLONOSCOPY N/A 07/22/2018   Procedure: COLONOSCOPY;  Surgeon: Virgel Manifold, MD;  Location: [facility] ENDOSCOPY;  Service: Endoscopy;  Laterality: N/A;  . DILATION AND CURETTAGE OF UTERUS    . ELECTROMAGNETIC NAVIGATION BROCHOSCOPY Right 04/11/2016   Procedure: ELECTROMAGNETIC NAVIGATION BRONCHOSCOPY;  Surgeon: Vilinda Boehringer, MD;  Location: [facility] ORS;  Service: Cardiopulmonary;  Laterality: Right;  . ESOPHAGOGASTRODUODENOSCOPY N/A 07/22/2018   Procedure: ESOPHAGOGASTRODUODENOSCOPY (EGD);  Surgeon: Virgel Manifold, MD;  Location[facility] ENDOSCOPY;  Service: Endoscopy;  Laterality: N/A;  . ESOPHAGOGASTRODUODENOSCOPY (EGD) WITH PROPOFOL N/A 05/07/2018   Procedure: ESOPHAGOGASTRODUODENOSCOPY (EGD) WITH PROPOFOL;  Surgeon: Lucilla Lame, MD;  Location[facility] ENDOSCOPY;  Service: Endoscopy;  Laterality: N/A;  . history of colonoscopy]    . ILEOSCOPY N/A 07/22/2018   Procedure: ILEOSCOPY THROUGH STOMA;  Surgeon: Virgel Manifold, MD;  Location: [facility] ENDOSCOPY;  Service: Endoscopy;  Laterality: N/A;  . ILEOSTOMY    . ILEOSTOMY CLOSURE N/A 08/15/2018   Procedure: DILATION OF ILEOSTOMY STRICTURE;  Surgeon: Herbert Pun, MD;  Location: [facility] ORS;  Service: General;  Laterality: N/A;  . LAPAROTOMY Right 05/04/2018   Procedure: EXPLORATORY LAPAROTOMY right colectomy right and left ostomy;  Surgeon: Herbert Pun, MD;  Location: [facility] ORS;  Service: General;  Laterality: Right;  . LUNG BIOPSY    . MASTECTOMY Left    2000, [facility]  . ROTATOR CUFF REPAIR Right    [facility]    SOCIAL HISTORY:   Social History   Tobacco Use  . Smoking status: Former Smoker    Packs/day: 0.50    Years: 20.00    Pack years: 10.00    Types: Cigarettes    Last attempt to quit: 07/02/2012    Years since quitting: 6.1  . Smokeless tobacco: Never Used  . Tobacco comment: quit 2014  Substance Use Topics  . Alcohol use:  Yes    Comment: Occasionally    FAMILY HISTORY:   Family History  Problem Relation Age of Onset  . Breast cancer Mother 34  . Cancer Mother        Breast   . Cirrhosis Father   . Breast cancer Paternal Aunt 92  . Cancer Maternal Aunt        Breast     DRUG ALLERGIES:  No Known Allergies  REVIEW OF SYSTEMS:   Review of Systems  Constitutional: Positive for malaise/fatigue and weight loss. Negative for chills, diaphoresis and fever.  HENT: Negative for congestion, ear pain, hearing loss, nosebleeds, sinus pain, sore throat and tinnitus.   Eyes: Negative for blurred vision, double vision and photophobia.  Respiratory: Negative for cough, hemoptysis, sputum production, shortness of breath and wheezing.   Cardiovascular: Negative for chest pain, palpitations, orthopnea, claudication, leg swelling and PND.  Gastrointestinal: Positive for abdominal pain, nausea and vomiting. Negative for blood in stool, constipation, diarrhea, heartburn and melena.  Genitourinary: Negative for dysuria, flank pain, frequency, hematuria and urgency.  Musculoskeletal: Negative for back pain, falls, joint pain, myalgias and neck pain.  Skin: Negative for itching and rash.  Neurological: Positive for weakness. Negative for dizziness, tingling, tremors, sensory change, speech change, focal weakness, seizures, loss of consciousness and headaches.  Psychiatric/Behavioral: Negative for memory loss. The patient does not have insomnia.    MEDICATIONS AT HOME:   Prior to Admission medications   Medication Sig Start Date End Date Taking? Authorizing Provider  albuterol (PROVENTIL HFA;VENTOLIN HFA) 108 (90 Base) MCG/ACT inhaler Inhale 2 puffs into the lungs every 6 (six) hours as needed for wheezing or shortness of breath. 04/24/18  Yes Demetrios Loll, MD  apixaban (ELIQUIS) 5 MG TABS tablet Take 1 tablet (5 mg total) by mouth 2 (two) times daily. 06/23/18  Yes [name], MD  budesonide-formoterol  (SYMBICORT) 160-4.5 MCG/ACT inhaler Inhale 2 puffs into the lungs 2 (two) times daily. 04/24/18  Yes Demetrios Loll, MD  carvedilol (COREG) 6.25 MG tablet Take 1 tablet (6.25 mg total) by mouth 2 (two) times daily. 06/24/18  Yes Hackney, Tina A, FNP  citalopram (CELEXA) 40 MG tablet Take 40 mg by mouth daily.   Yes [provider]  ferrous sulfate 325 (65 FE) MG tablet Take 1 tablet (325 mg total) by mouth 2 (two) times daily with a meal. 07/02/18  Yes [name], MD  furosemide (LASIX) 20 MG tablet Take 20 mg by mouth daily.    Yes [provider]  megestrol (MEGACE) 400 MG/10ML suspension Take 7.5 mLs (300 mg total) by mouth 2 (two) times daily. 06/05/18  Yes Fritzi Mandes, MD  Multiple Vitamin (MULTIVITAMIN WITH MINERALS) TABS tablet Take 1 tablet by mouth daily. 06/06/18  Yes Fritzi Mandes, MD  pantoprazole (PROTONIX) 40 MG tablet Take 1 tablet (40 mg total) by mouth 2 (two) times  daily before a meal. 07/23/18  Yes Sudini, Alveta Heimlich, MD  Potassium (POTASSIMIN PO) Take 1 tablet by mouth daily as needed.   Yes [provider]  HYDROcodone-acetaminophen (NORCO/VICODIN) 5-325 MG tablet Take 1 tablet by mouth every 6 (six) hours as needed for moderate pain or severe pain.    [provider]  traMADol (ULTRAM) 50 MG tablet Take 50 mg by mouth every 6 (six) hours as needed. for pain 07/28/18   [provider]      VITAL SIGNS:  Blood pressure 127/73, pulse 85, temperature 98.3 F (36.8 C), temperature source Oral, resp. rate 20, height 5\' 7"  (1.702 m), weight 54 kg, SpO2 97 %.  PHYSICAL EXAMINATION:  Physical Exam  Constitutional: She is oriented to person, place, and time. She appears well-developed. She is active and cooperative.  Non-toxic appearance. She has a sickly appearance. She appears ill. No distress. She is not intubated.  HENT:  Head: Normocephalic and atraumatic.  Mouth/Throat: Oropharynx is clear and moist. No oropharyngeal exudate.  Eyes:  Conjunctivae, EOM and lids are normal. No scleral icterus.  Neck: Neck supple. No JVD present. No thyromegaly present.  Cardiovascular: Normal rate, regular rhythm, S1 normal, S2 normal and normal heart sounds.  No extrasystoles are present. Exam reveals no gallop, no S3, no S4, no distant heart sounds and no friction rub.  No murmur heard. Pulmonary/Chest: Effort normal. No accessory muscle usage or stridor. No apnea, no tachypnea and no bradypnea. She is not intubated. No respiratory distress. She has decreased breath sounds in the right upper field, the right middle field, the right lower field, the left upper field, the left middle field and the left lower field. She has no wheezes. She has no rhonchi. She has no rales.  Abdominal: Soft. She exhibits no distension and no ascites. Bowel sounds are decreased. There is generalized tenderness. There is no rigidity, no rebound and no guarding.  Musculoskeletal: Normal range of motion. She exhibits no edema or tenderness.  Lymphadenopathy:    She has no cervical adenopathy.  Neurological: She is alert and oriented to person, place, and time. She is not disoriented.  Skin: Skin is warm and dry. No rash noted. She is not diaphoretic. No erythema.  Psychiatric: She has a normal mood and affect. Her speech is normal and behavior is normal. Judgment and thought content normal. Cognition and memory are normal.   LABORATORY PANEL:   CBC Recent Labs  Lab 09/04/18 1942  WBC 8.4  HGB 9.8*  HCT 30.6*  PLT 352   ------------------------------------------------------------------------------------------------------------------  Chemistries  Recent Labs  Lab 09/04/18 1942  NA 136  K 3.9  CL 109  CO2 17*  GLUCOSE 94  BUN 19  CREATININE 1.21*  CALCIUM 9.8  AST 20  ALT 18  ALKPHOS 72  BILITOT 0.6   ------------------------------------------------------------------------------------------------------------------  Cardiac Enzymes Recent Labs    Lab 09/04/18 1942  TROPONINI <0.03   ------------------------------------------------------------------------------------------------------------------  RADIOLOGY:  Dg Chest 1 View  Result Date: 09/04/2018 CLINICAL DATA:  Abdominal pain starting today. EXAM: CHEST  1 VIEW COMPARISON:  07/05/2018 FINDINGS: Stable right upper lobe scarring. Heart and mediastinal contours are stable with moderate aortic atherosclerosis of the thoracic aorta. Mild interstitial edema is noted. Subcortical cystic change about both humeral heads right greater than left. No acute nor suspicious osseous abnormalities. IMPRESSION: Mild interstitial edema and right upper lobe scarring. Electronically Signed   By: Ashley Royalty M.D.   On: 09/04/2018 20:22   Ct Abdomen Pelvis W  Contrast  Result Date: 09/05/2018 CLINICAL DATA:  Lower abdominal pain starting today. Vomiting. History of COPD on home oxygen as needed. EXAM: CT ABDOMEN AND PELVIS WITH CONTRAST TECHNIQUE: Multidetector CT imaging of the abdomen and pelvis was performed using the standard protocol following bolus administration of intravenous contrast. CONTRAST:  36mL OMNIPAQUE IOHEXOL 300 MG/ML  SOLN COMPARISON:  08/12/2018 FINDINGS: Lower chest: Lung bases are clear. Hepatobiliary: 2 small cysts in the liver. No other focal lesions. Gallbladder and bile ducts are unremarkable. Pancreas: Unremarkable. No pancreatic ductal dilatation or surrounding inflammatory changes. Spleen: Normal in size without focal abnormality. Adrenals/Urinary Tract: Adrenal glands are unremarkable. Kidneys are normal, without renal calculi, focal lesion, or hydronephrosis. Bladder is unremarkable. Stomach/Bowel: Small bowel are dilated and fluid-filled all the way to a right lower quadrant ileostomy. No transition zone is identified. This could represent early obstruction possibly at the level of the ileostomy, stasis, or enteritis. Left lower quadrant colostomy with decompressed colon.  Probable right hemicolectomy. Vascular/Lymphatic: Extensive vascular calcifications around the aorta and abdominal branch vessels. Prominent calcification in the superior mesenteric artery likely represents areas of stenosis although the vessel appears patent. Reproductive: Uterus and ovaries are not enlarged. Hyperdense lesion arising from the left uterine fundus may represent exophytic fibroid. Other: No free air or free fluid in the abdomen. Scarring in the anterior abdominal wall is likely postoperative. Musculoskeletal: Mild anterior compression of L4 without change. IMPRESSION: 1. Small bowel are dilated and fluid-filled proximal to a right lower quadrant ileostomy. No transition zone is identified. This could represent early obstruction at the level of the ileostomy, stasis, or enteritis. 2. Extensive vascular calcifications. Prominent calcification in the superior mesenteric artery likely represents areas of stenosis although the vessel appears patent. 3. Mildly hyperdense lesion arising from the left uterine fundus may represent exophytic fibroid. Electronically Signed   By: Lucienne Capers M.D.   On: 09/05/2018 04:25   IMPRESSION AND PLAN:   A/P: 61F p/w intractable AP/N/V. As per HPI. Cr elevation/CKD III, normocytic anemia. -AP/N/V: CT claims possible early obstruction. Likely overread. Pt stooling. Not obstructed. Symptomatic mgmt, antiemetics, pain ctrl. IVF. Labs at baseline. Recently dilated. Symptoms possibly due to adhesions. Surgery consult. -Cr elevation, CKD III: Cr 1.2, at baseline. CKD III 2/2 DM, HTN, aged kidney. Monitor BMP, avoid nephrotoxins. -Normocytic anemia: Stable, likely anemia of chronic disease, no evidence of active/acute blood loss at present time. -c/w other home meds/formulary subs as tolerated. -FEN/GI: NPO, ADAT. -DVT PPx: Eliquis. -Code status: Full code. -Disposition: Observation, < 2 midnights.   All the records are reviewed and case discussed with ED  provider. Management plans discussed with the patient, family and they are in agreement.  CODE STATUS: Full code.  TOTAL TIME TAKING CARE OF THIS PATIENT: 75 minutes.    Arta Silence M.D on 09/05/2018 at 6:06 AM  Between 7am to 6pm - Pager - [phone]  After 6pm go to [email or web address] - Proofreader  [facility]  Office  [phone]  CC: Primary care physician; Elisabeth Cara, NP   Note: This dictation was prepared with Dragon dictation along with smaller phrase technology. Any transcriptional errors that result from this process are unintentional.

## 2018-09-06 DIAGNOSIS — N179 Acute kidney failure, unspecified: Secondary | ICD-10-CM | POA: Diagnosis present

## 2018-09-06 DIAGNOSIS — I480 Paroxysmal atrial fibrillation: Secondary | ICD-10-CM | POA: Diagnosis present

## 2018-09-06 DIAGNOSIS — R0600 Dyspnea, unspecified: Secondary | ICD-10-CM | POA: Diagnosis present

## 2018-09-06 DIAGNOSIS — D638 Anemia in other chronic diseases classified elsewhere: Secondary | ICD-10-CM | POA: Diagnosis present

## 2018-09-06 DIAGNOSIS — N183 Chronic kidney disease, stage 3 (moderate): Secondary | ICD-10-CM | POA: Diagnosis present

## 2018-09-06 DIAGNOSIS — Z9981 Dependence on supplemental oxygen: Secondary | ICD-10-CM | POA: Diagnosis not present

## 2018-09-06 DIAGNOSIS — I5022 Chronic systolic (congestive) heart failure: Secondary | ICD-10-CM | POA: Diagnosis present

## 2018-09-06 DIAGNOSIS — Z9221 Personal history of antineoplastic chemotherapy: Secondary | ICD-10-CM | POA: Diagnosis not present

## 2018-09-06 DIAGNOSIS — Z170 Estrogen receptor positive status [ER+]: Secondary | ICD-10-CM | POA: Diagnosis not present

## 2018-09-06 DIAGNOSIS — F419 Anxiety disorder, unspecified: Secondary | ICD-10-CM | POA: Diagnosis present

## 2018-09-06 DIAGNOSIS — M1990 Unspecified osteoarthritis, unspecified site: Secondary | ICD-10-CM | POA: Diagnosis present

## 2018-09-06 DIAGNOSIS — E1122 Type 2 diabetes mellitus with diabetic chronic kidney disease: Secondary | ICD-10-CM | POA: Diagnosis present

## 2018-09-06 DIAGNOSIS — E785 Hyperlipidemia, unspecified: Secondary | ICD-10-CM | POA: Diagnosis present

## 2018-09-06 DIAGNOSIS — Z9049 Acquired absence of other specified parts of digestive tract: Secondary | ICD-10-CM | POA: Diagnosis not present

## 2018-09-06 DIAGNOSIS — J449 Chronic obstructive pulmonary disease, unspecified: Secondary | ICD-10-CM | POA: Diagnosis present

## 2018-09-06 DIAGNOSIS — Z85118 Personal history of other malignant neoplasm of bronchus and lung: Secondary | ICD-10-CM | POA: Diagnosis not present

## 2018-09-06 DIAGNOSIS — F329 Major depressive disorder, single episode, unspecified: Secondary | ICD-10-CM | POA: Diagnosis present

## 2018-09-06 DIAGNOSIS — K9413 Enterostomy malfunction: Secondary | ICD-10-CM | POA: Diagnosis present

## 2018-09-06 DIAGNOSIS — Z853 Personal history of malignant neoplasm of breast: Secondary | ICD-10-CM | POA: Diagnosis not present

## 2018-09-06 DIAGNOSIS — Z9012 Acquired absence of left breast and nipple: Secondary | ICD-10-CM | POA: Diagnosis not present

## 2018-09-06 DIAGNOSIS — K56600 Partial intestinal obstruction, unspecified as to cause: Secondary | ICD-10-CM | POA: Diagnosis present

## 2018-09-06 DIAGNOSIS — Z87891 Personal history of nicotine dependence: Secondary | ICD-10-CM | POA: Diagnosis not present

## 2018-09-06 DIAGNOSIS — Z923 Personal history of irradiation: Secondary | ICD-10-CM | POA: Diagnosis not present

## 2018-09-06 DIAGNOSIS — R109 Unspecified abdominal pain: Secondary | ICD-10-CM | POA: Diagnosis present

## 2018-09-06 DIAGNOSIS — I130 Hypertensive heart and chronic kidney disease with heart failure and stage 1 through stage 4 chronic kidney disease, or unspecified chronic kidney disease: Secondary | ICD-10-CM | POA: Diagnosis present

## 2018-09-06 LAB — GLUCOSE, CAPILLARY
GLUCOSE-CAPILLARY: 93 mg/dL (ref 70–99)
GLUCOSE-CAPILLARY: 126 mg/dL — AB (ref 70–99)
GLUCOSE-CAPILLARY: 106 mg/dL — AB (ref 70–99)
GLUCOSE-CAPILLARY: 126 mg/dL — AB (ref 70–99)

## 2018-09-06 NOTE — Plan of Care (Signed)
  Problem: Health Behavior/Discharge Planning: Goal: Ability to manage health-related needs will improve Outcome: Progressing   Problem: Clinical Measurements: Goal: Will remain free from infection Outcome: Progressing   Problem: Activity: Goal: Risk for activity intolerance will decrease Outcome: Progressing   Problem: Nutrition: Goal: Adequate nutrition will be maintained Outcome: Progressing   Problem: Coping: Goal: Level of anxiety will decrease Outcome: Progressing   Problem: Pain Managment: Goal: General experience of comfort will improve Outcome: Progressing   Problem: Skin Integrity: Goal: Risk for impaired skin integrity will decrease Outcome: Progressing

## 2018-09-06 NOTE — Progress Notes (Signed)
[place] Hospital Day(s): 0.   Post op day(s):  [name]   Interval History: Patient seen and examined, no acute events or new complaints overnight. Patient reports continue having pain on the ostomy site due to hardening of the stool coming out, denies nausea or vomiting.  Vital signs in last 24 hours: [min-max] current  Temp:  [97.8 F (36.6 C)-99.4 F (37.4 C)] 98.2 F (36.8 C) (11/23 0741) Pulse Rate:  [70-86] 70 (11/23 0741) Resp:  [16-21] 16 (11/23 0030) BP: (88-105)/(63-68) 100/64 (11/23 0741) SpO2:  [97 %-98 %] 98 % (11/23 0741)     Height: 5\' 7"  (170.2 cm) Weight: 54 kg BMI (Calculated): 18.63    Physical Exam:  Constitutional: alert, cooperative and no distress  Respiratory: breathing non-labored at rest  Cardiovascular: regular rate and sinus rhythm  Gastrointestinal: soft, non-tender, and non-distended. Ostomy retracted, with stricture but patent  Labs:  CBC Latest Ref Rng & Units 09/04/2018 09/02/2018 08/15/2018  WBC 4.0 - 10.5 K/uL 8.4 8.4 5.6  Hemoglobin 12.0 - 15.0 g/dL 9.8(L) 9.7(L) 8.2(L)  Hematocrit 36.0 - 46.0 % 30.6(L) 30.7(L) 25.2(L)  Platelets 150 - 400 K/uL 352 341 277   CMP Latest Ref Rng & Units 09/04/2018 09/02/2018 08/15/2018  Glucose 70 - 99 mg/dL 94 79 91  BUN 8 - 23 mg/dL 19 18 17   Creatinine 0.44 - 1.00 mg/dL 1.21(H) 1.11(H) 1.14(H)  Sodium 135 - 145 mmol/L 136 136 134(L)  Potassium 3.5 - 5.1 mmol/L 3.9 4.0 4.1  Chloride 98 - 111 mmol/L 109 113(H) 111  CO2 22 - 32 mmol/L 17(L) 14(L) 17(L)  Calcium 8.9 - 10.3 mg/dL 9.8 9.5 9.0  Total Protein 6.5 - 8.1 g/dL 7.9 7.3 -  Total Bilirubin 0.3 - 1.2 mg/dL 0.6 0.6 -  Alkaline Phos 38 - 126 U/L 72 59 -  AST 15 - 41 U/L 20 17 -  ALT 0 - 44 U/L 18 14 -    Imaging studies: No new pertinent imaging studies   Assessment/Plan:  62 y.o.femalewithintermittent episodes ofsmall bowel obstructions/pright hemicolectomy with end ileostomy and mucus fistula(05/04/18) for bleeding hepatic  flexure, complicated by pertinent comorbidities includingCHF, atrial fibrillation on anticoagulation, acute renal failure, COPD, history of lung and breast cancer. Patient developed retraction and stricture from the ileostomy. It was treated initially with dilation but stricture recurred and is causing pain.  I talked to the patient and discussed the alternative or serial dilation vs formal ileostomy revision. Patient this time agree to proceed with ileostomy revision to try to do a new, better ileostomy.  I hold the Eliquis, that should be hold for 48 hours. Patient needs medical clearance. I consider that even patient has multiple significant chronic medical condition such as the COPD, CHF and anticoagulation, she is stable on those conditions. Patient agrees that formal ileostomy reversal with anastomosis is to risky with her. Will appreciate Internal Medicine input regarding his medical conditions going forward to surgery. Will follow closely.   Arnold Long, MD

## 2018-09-06 NOTE — Progress Notes (Signed)
[facility] at [place] NAME: Danielle [name]    MR#:  [id number]  DATE OF BIRTH:  [birth year]  SUBJECTIVE:   Patient is doing well tolerating regular diet. She is having issues with pain on her ileostomy with hard stool coming out. REVIEW OF SYSTEMS:   Review of Systems  Constitutional: Negative for chills, fever and weight loss.  HENT: Negative for ear discharge, ear pain and nosebleeds.   Eyes: Negative for blurred vision, pain and discharge.  Respiratory: Negative for sputum production, shortness of breath, wheezing and stridor.   Cardiovascular: Negative for chest pain, palpitations, orthopnea and PND.  Gastrointestinal: Positive for abdominal pain. Negative for diarrhea, nausea and vomiting.  Genitourinary: Negative for frequency and urgency.  Musculoskeletal: Negative for back pain and joint pain.  Neurological: Negative for sensory change, speech change, focal weakness and weakness.  Psychiatric/Behavioral: Negative for depression and hallucinations. The patient is not nervous/anxious.    Tolerating Diet:yesTolerating PT: ambulaotry  DRUG ALLERGIES:  No Known Allergies  VITALS:  Blood pressure 100/64, pulse 70, temperature 98.2 F (36.8 C), temperature source Oral, resp. rate 16, height 5\' 7"  (1.702 m), weight 54 kg, SpO2 98 %.  PHYSICAL EXAMINATION:   Physical Exam  GENERAL:  62 y.o.-year-old patient lying in the bed with no acute distress.  EYES: Pupils equal, round, reactive to light and accommodation. No scleral icterus. Extraocular muscles intact.  HEENT: Head atraumatic, normocephalic. Oropharynx and nasopharynx clear.  NECK:  Supple, no jugular venous distention. No thyroid enlargement, no tenderness.  LUNGS: Normal breath sounds bilaterally, no wheezing, rales, rhonchi. No use of accessory muscles of respiration.  CARDIOVASCULAR: S1, S2 normal. No murmurs, rubs, or gallops.  ABDOMEN: Soft, nontender, nondistended.  Bowel sounds present. No organomegaly or mass. Ileostomy and ostomy sites+ EXTREMITIES: No cyanosis, clubbing or edema b/l.    NEUROLOGIC: Cranial nerves II through XII are intact. No focal Motor or sensory deficits b/l.   PSYCHIATRIC:  patient is alert and oriented x 3.  SKIN: No obvious rash, lesion, or ulcer.   LABORATORY PANEL:  CBC Recent Labs  Lab 09/04/18 1942  WBC 8.4  HGB 9.8*  HCT 30.6*  PLT 352    Chemistries  Recent Labs  Lab 09/04/18 1942 09/05/18 0810  NA 136  --   K 3.9  --   CL 109  --   CO2 17*  --   GLUCOSE 94  --   BUN 19  --   CREATININE 1.21*  --   CALCIUM 9.8  --   MG  --  2.1  AST 20  --   ALT 18  --   ALKPHOS 72  --   BILITOT 0.6  --    Cardiac Enzymes Recent Labs  Lab 09/04/18 1942  TROPONINI <0.03   RADIOLOGY:  Dg Chest 1 View  Result Date: 09/04/2018 CLINICAL DATA:  Abdominal pain starting today. EXAM: CHEST  1 VIEW COMPARISON:  07/05/2018 FINDINGS: Stable right upper lobe scarring. Heart and mediastinal contours are stable with moderate aortic atherosclerosis of the thoracic aorta. Mild interstitial edema is noted. Subcortical cystic change about both humeral heads right greater than left. No acute nor suspicious osseous abnormalities. IMPRESSION: Mild interstitial edema and right upper lobe scarring. Electronically Signed   By: Ashley Royalty M.D.   On: 09/04/2018 20:22   Ct Abdomen Pelvis W Contrast  Result Date: 09/05/2018 CLINICAL DATA:  Lower abdominal pain starting today. Vomiting. History of COPD on home  oxygen as needed. EXAM: CT ABDOMEN AND PELVIS WITH CONTRAST TECHNIQUE: Multidetector CT imaging of the abdomen and pelvis was performed using the standard protocol following bolus administration of intravenous contrast. CONTRAST:  63mL OMNIPAQUE IOHEXOL 300 MG/ML  SOLN COMPARISON:  08/12/2018 FINDINGS: Lower chest: Lung bases are clear. Hepatobiliary: 2 small cysts in the liver. No other focal lesions. Gallbladder and bile ducts are  unremarkable. Pancreas: Unremarkable. No pancreatic ductal dilatation or surrounding inflammatory changes. Spleen: Normal in size without focal abnormality. Adrenals/Urinary Tract: Adrenal glands are unremarkable. Kidneys are normal, without renal calculi, focal lesion, or hydronephrosis. Bladder is unremarkable. Stomach/Bowel: Small bowel are dilated and fluid-filled all the way to a right lower quadrant ileostomy. No transition zone is identified. This could represent early obstruction possibly at the level of the ileostomy, stasis, or enteritis. Left lower quadrant colostomy with decompressed colon. Probable right hemicolectomy. Vascular/Lymphatic: Extensive vascular calcifications around the aorta and abdominal branch vessels. Prominent calcification in the superior mesenteric artery likely represents areas of stenosis although the vessel appears patent. Reproductive: Uterus and ovaries are not enlarged. Hyperdense lesion arising from the left uterine fundus may represent exophytic fibroid. Other: No free air or free fluid in the abdomen. Scarring in the anterior abdominal wall is likely postoperative. Musculoskeletal: Mild anterior compression of L4 without change. IMPRESSION: 1. Small bowel are dilated and fluid-filled proximal to a right lower quadrant ileostomy. No transition zone is identified. This could represent early obstruction at the level of the ileostomy, stasis, or enteritis. 2. Extensive vascular calcifications. Prominent calcification in the superior mesenteric artery likely represents areas of stenosis although the vessel appears patent. 3. Mildly hyperdense lesion arising from the left uterine fundus may represent exophytic fibroid. Electronically Signed   By: Lucienne Capers M.D.   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 Abdominal pain has improved and patient having bowel movements -Surgery  following-s/pdilation of the ileostomy11/10/2017-- plans for revision of ileostomy noted for Monday. -Hold eliquis -Continuesoft diet tolerating it well -prn pain meds and antiemetics  Paroxysmal atrial fibrillation-in normal sinus rhythm here -hold eliquis -Continue Coreg  Chronic systolic congestive heart failure- stable, no signs of volume overload -Continue Coreg  Normocytic anemia- stable, hemoglobin at baseline  Hypertension- BPs soft -Continue Coreg  Depression- stable -Continue Celexa  COPD-stable, no signs of acute exacerbation -Renew home inhalers  DVT prophylaxis-SCD  Patient is best at baseline given her chronic medical problems. She understands the risk and benefits for surgery.  Case discussed with Care Management/Social Worker. Management plans discussed with the patient, family and they are in agreement.  CODE STATUS: full  DVT Prophylaxis: scd  TOTAL TIME TAKING CARE OF THIS PATIENT: *30* minutes.  >50% time spent on counselling and coordination of care  POSSIBLE D/C IN *2-3* DAYS, DEPENDING ON CLINICAL CONDITION.  Note: This dictation was prepared with Dragon dictation along with smaller phrase technology. Any transcriptional errors that result from this process are unintentional.  Fritzi Mandes M.D on 09/06/2018 at 2:07 PM  Between 7am to 6pm - Pager - [phone]  After 6pm go to [email or web address] - password [email or web address] [facility]  Office  [phone]  CC: Primary care physician; Elisabeth Cara, NPPatient ID: Danielle [name], female   DOB: Aug 19, 1956, 62 y.o.   MRN: [id number]

## 2018-09-07 LAB — GLUCOSE, CAPILLARY
GLUCOSE-CAPILLARY: 89 mg/dL (ref 70–99)
Glucose-Capillary: 93 mg/dL (ref 70–99)
Glucose-Capillary: 71 mg/dL (ref 70–99)
Glucose-Capillary: 127 mg/dL — ABNORMAL HIGH (ref 70–99)

## 2018-09-07 NOTE — Plan of Care (Signed)
  Problem: Clinical Measurements: Goal: Will remain free from infection Outcome: Progressing   Problem: Activity: Goal: Risk for activity intolerance will decrease Outcome: Progressing   Problem: Nutrition: Goal: Adequate nutrition will be maintained Outcome: Progressing   Problem: Coping: Goal: Level of anxiety will decrease Outcome: Progressing   Problem: Pain Managment: Goal: General experience of comfort will improve Outcome: Progressing   Problem: Skin Integrity: Goal: Risk for impaired skin integrity will decrease Outcome: Progressing

## 2018-09-07 NOTE — H&P (View-Only) (Signed)
[facility] Hospital Day(s): 1.   Post op day(s):  [name]   Interval History: Patient seen and examined, no acute events or new complaints overnight. Patient reports continue having pain on the right lower quadrant. Denies nausea and vomiting.  Vital signs in last 24 hours: [min-max] current  Temp:  [97.8 F (36.6 C)-98.7 F (37.1 C)] 97.8 F (36.6 C) (11/24 0755) Pulse Rate:  [72-84] 74 (11/24 0755) Resp:  [16-18] 16 (11/24 0755) BP: (111-128)/(70-90) 118/90 (11/24 0755) SpO2:  [98 %-99 %] 98 % (11/24 0755)     Height: 5\' 7"  (170.2 cm) Weight: 54 kg BMI (Calculated): 18.63    Ileostomy: 250 mL  Physical Exam:  Constitutional: alert, cooperative and no distress  Respiratory: breathing non-labored at rest  Cardiovascular: regular rate and sinus rhythm  Gastrointestinal: soft, non-tender, and non-distended. Retracted ileostomy with stricture.   Labs:  CBC Latest Ref Rng & Units 09/04/2018 09/02/2018 08/15/2018  WBC 4.0 - 10.5 K/uL 8.4 8.4 5.6  Hemoglobin 12.0 - 15.0 g/dL 9.8(L) 9.7(L) 8.2(L)  Hematocrit 36.0 - 46.0 % 30.6(L) 30.7(L) 25.2(L)  Platelets 150 - 400 K/uL 352 341 277   CMP Latest Ref Rng & Units 09/04/2018 09/02/2018 08/15/2018  Glucose 70 - 99 mg/dL 94 79 91  BUN 8 - 23 mg/dL 19 18 17   Creatinine 0.44 - 1.00 mg/dL 1.21(H) 1.11(H) 1.14(H)  Sodium 135 - 145 mmol/L 136 136 134(L)  Potassium 3.5 - 5.1 mmol/L 3.9 4.0 4.1  Chloride 98 - 111 mmol/L 109 113(H) 111  CO2 22 - 32 mmol/L 17(L) 14(L) 17(L)  Calcium 8.9 - 10.3 mg/dL 9.8 9.5 9.0  Total Protein 6.5 - 8.1 g/dL 7.9 7.3 -  Total Bilirubin 0.3 - 1.2 mg/dL 0.6 0.6 -  Alkaline Phos 38 - 126 U/L 72 59 -  AST 15 - 41 U/L 20 17 -  ALT 0 - 44 U/L 18 14 -    Imaging studies: No new pertinent imaging studies   Assessment/Plan:  62 y.o.femalewithintermittent episodes ofsmall bowel obstructions/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 Patient developed retraction and stricture from the ileostomy. It was treated initially with dilation but stricture recurred and is causing pain.   Pain has not improving despite trying to make stool softer. Patient oriented again about surgical management of ileostomy revision.  I talked to the daughter and discussed the surgical plant, she agreed to proceed with surgery.  Patient is alert, oriented and understand the plan as well.  Internal medicine transferred the patient to my service due to a complication of the ileostomy. The agree to follow up patient daily to keep her medical conditions (COPD, CHF, atrial fibrillation on anticoagulation, hypertension, among others) under control.   Arnold Long, MD

## 2018-09-07 NOTE — Progress Notes (Signed)
[place] at [place] NAME: Danielle [name]    MR#:  [id number]  DATE OF BIRTH:  January 31, 1956  SUBJECTIVE:   Patient is doing well tolerating regular diet. She is having issues with pain on her ileostomy with hard stool coming out. REVIEW OF SYSTEMS:   Review of Systems  Constitutional: Negative for chills, fever and weight loss.  HENT: Negative for ear discharge, ear pain and nosebleeds.   Eyes: Negative for blurred vision, pain and discharge.  Respiratory: Negative for sputum production, shortness of breath, wheezing and stridor.   Cardiovascular: Negative for chest pain, palpitations, orthopnea and PND.  Gastrointestinal: Positive for abdominal pain. Negative for diarrhea, nausea and vomiting.  Genitourinary: Negative for frequency and urgency.  Musculoskeletal: Negative for back pain and joint pain.  Neurological: Negative for sensory change, speech change, focal weakness and weakness.  Psychiatric/Behavioral: Negative for depression and hallucinations. The patient is not nervous/anxious.    Tolerating Diet:yesTolerating PT: ambulaotry  DRUG ALLERGIES:  No Known Allergies  VITALS:  Blood pressure 118/90, pulse 74, temperature 97.8 F (36.6 C), temperature source Oral, resp. rate 16, height 5\' 7"  (1.702 m), weight 54 kg, SpO2 98 %.  PHYSICAL EXAMINATION:   Physical Exam  GENERAL:  62 y.o.-year-old patient lying in the bed with no acute distress.  EYES: Pupils equal, round, reactive to light and accommodation. No scleral icterus. Extraocular muscles intact.  HEENT: Head atraumatic, normocephalic. Oropharynx and nasopharynx clear.  NECK:  Supple, no jugular venous distention. No thyroid enlargement, no tenderness.  LUNGS: Normal breath sounds bilaterally, no wheezing, rales, rhonchi. No use of accessory muscles of respiration.  CARDIOVASCULAR: S1, S2 normal. No murmurs, rubs, or gallops.  ABDOMEN: Soft, nontender, nondistended.  Bowel sounds present. No organomegaly or mass. Ileostomy and ostomy sites+ EXTREMITIES: No cyanosis, clubbing or edema b/l.    NEUROLOGIC: Cranial nerves II through XII are intact. No focal Motor or sensory deficits b/l.   PSYCHIATRIC:  patient is alert and oriented x 3.  SKIN: No obvious rash, lesion, or ulcer.   LABORATORY PANEL:  CBC Recent Labs  Lab 09/04/18 1942  WBC 8.4  HGB 9.8*  HCT 30.6*  PLT 352    Chemistries  Recent Labs  Lab 09/04/18 1942 09/05/18 0810  NA 136  --   K 3.9  --   CL 109  --   CO2 17*  --   GLUCOSE 94  --   BUN 19  --   CREATININE 1.21*  --   CALCIUM 9.8  --   MG  --  2.1  AST 20  --   ALT 18  --   ALKPHOS 72  --   BILITOT 0.6  --    Cardiac Enzymes Recent Labs  Lab 09/04/18 1942  TROPONINI <0.03   RADIOLOGY:  No results found. 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 Abdominal pain has improved and patient having bowel movements -Surgery following-s/pdilation of the ileostomy11/10/2017-- plans for revision of ileostomy noted for Monday. -Hold eliquis -Continuesoft diet tolerating it well -prn pain meds and antiemetics  Paroxysmal atrial fibrillation-in normal sinus rhythm here -hold eliquis -Continue Coreg  Chronic systolic congestive heart failure- stable, no signs of volume overload -Continue Coreg  Normocytic anemia- stable, hemoglobin at baseline  Hypertension- BPs soft -Continue Coreg  Depression- stable -Continue Celexa  COPD-stable, no signs of acute exacerbation -Renew home inhalers  DVT prophylaxis-SCD  Patient is best at  baseline given her chronic medical problems. She understands the risk and benefits for surgery.  Case discussed with Care Management/Social Worker. Management plans discussed with the patient, family and they are in agreement.  CODE STATUS: full  DVT Prophylaxis: scd  TOTAL TIME TAKING CARE OF THIS PATIENT: *30*  minutes.  >50% time spent on counselling and coordination of care  POSSIBLE D/C IN *2-3* DAYS, DEPENDING ON CLINICAL CONDITION.  Note: This dictation was prepared with Dragon dictation along with smaller phrase technology. Any transcriptional errors that result from this process are unintentional.  Fritzi Mandes M.D on 09/07/2018 at 11:24 AM  Between 7am to 6pm - Pager - [phone]  After 6pm go to [email or web address] - password [email or web address] [facility]  Office  [phone]  CC: Primary care physician; Elisabeth Cara, NPPatient ID: Danielle [name], female   DOB: 05/05/1956, 62 y.o.   MRN: [id number]

## 2018-09-07 NOTE — Progress Notes (Signed)
[facility] Hospital Day(s): 1.   Post op day(s):  [name]   Interval History: Patient seen and examined, no acute events or new complaints overnight. Patient reports continue having pain on the right lower quadrant. Denies nausea and vomiting.  Vital signs in last 24 hours: [min-max] current  Temp:  [97.8 F (36.6 C)-98.7 F (37.1 C)] 97.8 F (36.6 C) (11/24 0755) Pulse Rate:  [72-84] 74 (11/24 0755) Resp:  [16-18] 16 (11/24 0755) BP: (111-128)/(70-90) 118/90 (11/24 0755) SpO2:  [98 %-99 %] 98 % (11/24 0755)     Height: 5\' 7"  (170.2 cm) Weight: 54 kg BMI (Calculated): 18.63    Ileostomy: 250 mL  Physical Exam:  Constitutional: alert, cooperative and no distress  Respiratory: breathing non-labored at rest  Cardiovascular: regular rate and sinus rhythm  Gastrointestinal: soft, non-tender, and non-distended. Retracted ileostomy with stricture.   Labs:  CBC Latest Ref Rng & Units 09/04/2018 09/02/2018 08/15/2018  WBC 4.0 - 10.5 K/uL 8.4 8.4 5.6  Hemoglobin 12.0 - 15.0 g/dL 9.8(L) 9.7(L) 8.2(L)  Hematocrit 36.0 - 46.0 % 30.6(L) 30.7(L) 25.2(L)  Platelets 150 - 400 K/uL 352 341 277   CMP Latest Ref Rng & Units 09/04/2018 09/02/2018 08/15/2018  Glucose 70 - 99 mg/dL 94 79 91  BUN 8 - 23 mg/dL 19 18 17   Creatinine 0.44 - 1.00 mg/dL 1.21(H) 1.11(H) 1.14(H)  Sodium 135 - 145 mmol/L 136 136 134(L)  Potassium 3.5 - 5.1 mmol/L 3.9 4.0 4.1  Chloride 98 - 111 mmol/L 109 113(H) 111  CO2 22 - 32 mmol/L 17(L) 14(L) 17(L)  Calcium 8.9 - 10.3 mg/dL 9.8 9.5 9.0  Total Protein 6.5 - 8.1 g/dL 7.9 7.3 -  Total Bilirubin 0.3 - 1.2 mg/dL 0.6 0.6 -  Alkaline Phos 38 - 126 U/L 72 59 -  AST 15 - 41 U/L 20 17 -  ALT 0 - 44 U/L 18 14 -    Imaging studies: No new pertinent imaging studies   Assessment/Plan:  62 y.o.femalewithintermittent episodes ofsmall bowel obstructions/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 Patient developed retraction and stricture from the ileostomy. It was treated initially with dilation but stricture recurred and is causing pain.   Pain has not improving despite trying to make stool softer. Patient oriented again about surgical management of ileostomy revision.  I talked to the daughter and discussed the surgical plant, she agreed to proceed with surgery.  Patient is alert, oriented and understand the plan as well.  Internal medicine transferred the patient to my service due to a complication of the ileostomy. The agree to follow up patient daily to keep her medical conditions (COPD, CHF, atrial fibrillation on anticoagulation, hypertension, among others) under control.   Arnold Long, MD

## 2018-09-08 ENCOUNTER — Encounter
Admission: EM | Disposition: A | Discharge status: Home / Self Care | Payer: Self-pay | Source: Home / Self Care | Attending: General Surgery

## 2018-09-08 ENCOUNTER — Inpatient Hospital Stay: Payer: Medicaid Other | Admitting: Anesthesiology

## 2018-09-08 ENCOUNTER — Encounter: Payer: Self-pay | Admitting: Anesthesiology

## 2018-09-08 HISTORY — PX: ILEOSTOMY: SHX1783

## 2018-09-08 LAB — GLUCOSE, CAPILLARY
GLUCOSE-CAPILLARY: 78 mg/dL (ref 70–99)
GLUCOSE-CAPILLARY: 76 mg/dL (ref 70–99)
Glucose-Capillary: 79 mg/dL (ref 70–99)
Glucose-Capillary: 96 mg/dL (ref 70–99)

## 2018-09-08 SURGERY — CREATION, ILEOSTOMY
Anesthesia: General | Site: Abdomen

## 2018-09-08 MED ORDER — CEFAZOLIN SODIUM-DEXTROSE 2-4 GM/100ML-% IV SOLN
INTRAVENOUS | Status: AC
  Filled 2018-09-08: qty 100

## 2018-09-08 MED ORDER — SUGAMMADEX SODIUM 200 MG/2ML IV SOLN
INTRAVENOUS | Status: AC
  Filled 2018-09-08: qty 2

## 2018-09-08 MED ORDER — PROPOFOL 10 MG/ML IV BOLUS
INTRAVENOUS | Status: DC | PRN
  Administered 2018-09-08: 100 mg via INTRAVENOUS

## 2018-09-08 MED ORDER — CEFAZOLIN SODIUM-DEXTROSE 2-4 GM/100ML-% IV SOLN
2.0000 g | Freq: Once | INTRAVENOUS | Status: AC
  Administered 2018-09-08: 2 g via INTRAVENOUS

## 2018-09-08 MED ORDER — FENTANYL CITRATE (PF) 100 MCG/2ML IJ SOLN
INTRAMUSCULAR | Status: DC | PRN
  Administered 2018-09-08: 50 ug via INTRAVENOUS
  Administered 2018-09-08 (×2): 25 ug via INTRAVENOUS
  Administered 2018-09-08 (×2): 50 ug via INTRAVENOUS

## 2018-09-08 MED ORDER — FENTANYL CITRATE (PF) 100 MCG/2ML IJ SOLN
INTRAMUSCULAR | Status: AC
  Filled 2018-09-08: qty 2

## 2018-09-08 MED ORDER — FENTANYL CITRATE (PF) 100 MCG/2ML IJ SOLN
INTRAMUSCULAR | Status: AC
  Administered 2018-09-08: 25 ug via INTRAVENOUS
  Filled 2018-09-08: qty 2

## 2018-09-08 MED ORDER — ROCURONIUM BROMIDE 100 MG/10ML IV SOLN
INTRAVENOUS | Status: DC | PRN
  Administered 2018-09-08 (×2): 10 mg via INTRAVENOUS
  Administered 2018-09-08: 30 mg via INTRAVENOUS
  Administered 2018-09-08: 5 mg via INTRAVENOUS

## 2018-09-08 MED ORDER — SILVER NITRATE-POT NITRATE 75-25 % EX MISC
CUTANEOUS | Status: AC
  Filled 2018-09-08: qty 1

## 2018-09-08 MED ORDER — EPHEDRINE SULFATE 50 MG/ML IJ SOLN
INTRAMUSCULAR | Status: DC | PRN
  Administered 2018-09-08: 5 mg via INTRAVENOUS

## 2018-09-08 MED ORDER — LIDOCAINE HCL (CARDIAC) PF 100 MG/5ML IV SOSY
PREFILLED_SYRINGE | INTRAVENOUS | Status: DC | PRN
  Administered 2018-09-08: 80 mg via INTRAVENOUS

## 2018-09-08 MED ORDER — SODIUM CHLORIDE 0.9 % IV SOLN
INTRAVENOUS | Status: DC
  Administered 2018-09-08 – 2018-09-10 (×4): via INTRAVENOUS

## 2018-09-08 MED ORDER — FENTANYL CITRATE (PF) 100 MCG/2ML IJ SOLN
25.0000 ug | INTRAMUSCULAR | Status: DC | PRN
  Administered 2018-09-08 (×3): 25 ug via INTRAVENOUS

## 2018-09-08 MED ORDER — ACETAMINOPHEN 10 MG/ML IV SOLN
INTRAVENOUS | Status: DC | PRN
  Administered 2018-09-08: 1000 mg via INTRAVENOUS

## 2018-09-08 MED ORDER — ACETAMINOPHEN 10 MG/ML IV SOLN
INTRAVENOUS | Status: AC
  Filled 2018-09-08: qty 100

## 2018-09-08 MED ORDER — SILVER NITRATE-POT NITRATE 75-25 % EX MISC
CUTANEOUS | Status: DC | PRN
  Administered 2018-09-08: 3

## 2018-09-08 MED ORDER — ONDANSETRON HCL 4 MG/2ML IJ SOLN: 4.0000 mg | Freq: Once | INTRAMUSCULAR | Status: DC | PRN

## 2018-09-08 MED ORDER — SUGAMMADEX SODIUM 200 MG/2ML IV SOLN
INTRAVENOUS | Status: DC | PRN
  Administered 2018-09-08: 100 mg via INTRAVENOUS

## 2018-09-08 MED ORDER — LACTATED RINGERS IV SOLN
INTRAVENOUS | Status: DC | PRN
  Administered 2018-09-08: 14:00:00 via INTRAVENOUS

## 2018-09-08 SURGICAL SUPPLY — 41 items
CANISTER SUCT 1200ML W/VALVE (MISCELLANEOUS) ×3 IMPLANT
CHLORAPREP W/TINT 26ML (MISCELLANEOUS) ×1 IMPLANT
CLIP VESOCCLUDE LG 6/CT (CLIP) IMPLANT
CLIP VESOCCLUDE MED 6/CT (CLIP) IMPLANT
CLOSURE WOUND 1/2 X4 (GAUZE/BANDAGES/DRESSINGS)
COVER WAND RF STERILE (DRAPES) ×3 IMPLANT
DRAIN PENROSE 5/8X12 LTX STRL (DRAIN) ×1 IMPLANT
DRAPE LAPAROTOMY 100X77 ABD (DRAPES) ×3 IMPLANT
DRSG OPSITE POSTOP 4X10 (GAUZE/BANDAGES/DRESSINGS) ×1 IMPLANT
DRSG OPSITE POSTOP 4X8 (GAUZE/BANDAGES/DRESSINGS) ×1 IMPLANT
ELECT REM PT RETURN 9FT ADLT (ELECTROSURGICAL) ×3
ELECTRODE REM PT RTRN 9FT ADLT (ELECTROSURGICAL) ×1 IMPLANT
GLOVE BIO SURGEON STRL SZ 6.5 (GLOVE) ×4 IMPLANT
GLOVE BIO SURGEONS STRL SZ 6.5 (GLOVE) ×2
GOWN STRL REUS W/ TWL LRG LVL3 (GOWN DISPOSABLE) ×6 IMPLANT
GOWN STRL REUS W/TWL LRG LVL3 (GOWN DISPOSABLE) ×4
HOLDER FOLEY CATH W/STRAP (MISCELLANEOUS) ×1 IMPLANT
KIT TURNOVER KIT A (KITS) ×3 IMPLANT
LABEL OR SOLS (LABEL) ×3 IMPLANT
LIGASURE IMPACT 36 18CM CVD LR (INSTRUMENTS) ×1 IMPLANT
NS IRRIG 1000ML POUR BTL (IV SOLUTION) ×3 IMPLANT
PACK BASIN MAJOR ARMC (MISCELLANEOUS) ×3 IMPLANT
PACK COLON CLEAN CLOSURE (MISCELLANEOUS) ×1 IMPLANT
SOL PREP PVP 2OZ (MISCELLANEOUS) ×3
SOLUTION PREP PVP 2OZ (MISCELLANEOUS) ×1 IMPLANT
SPONGE LAP 18X18 RF (DISPOSABLE) ×1 IMPLANT
STAPLER AUT SUT LDS 15W (STAPLE) ×1 IMPLANT
STAPLER PROXIMATE 55 BLUE (STAPLE) ×2 IMPLANT
STAPLER SKIN PROX 35W (STAPLE) ×1 IMPLANT
STRIP CLOSURE SKIN 1/2X4 (GAUZE/BANDAGES/DRESSINGS) ×1 IMPLANT
SURGILUBE 2OZ TUBE FLIPTOP (MISCELLANEOUS) ×3 IMPLANT
SUT ETHILON 4-0 (SUTURE)
SUT ETHILON 4-0 FS2 18XMFL BLK (SUTURE)
SUT NYLON 2-0 (SUTURE) ×2 IMPLANT
SUT PDS AB 1 TP1 54 (SUTURE) ×2 IMPLANT
SUT VIC AB 3-0 SH 27 (SUTURE)
SUT VIC AB 3-0 SH 27X BRD (SUTURE) ×1 IMPLANT
SUT VICRYL 3-0 CR8 SH (SUTURE) ×2 IMPLANT
SUTURE ETHLN 4-0 FS2 18XMF BLK (SUTURE) ×1 IMPLANT
TRAY FOLEY MTR SLVR 16FR STAT (SET/KITS/TRAYS/PACK) ×1 IMPLANT
WAFER FLANGE 1 3/4 SMALL GREEN (OSTOMY) ×4 IMPLANT

## 2018-09-08 NOTE — Progress Notes (Signed)
[facility] at [place] NAME: Danielle [name]    MR#:  [id number]  DATE OF BIRTH:  [birth year]  SUBJECTIVE:   C/o abd pain REVIEW OF SYSTEMS:   Review of Systems  Constitutional: Negative for chills, fever and weight loss.  HENT: Negative for ear discharge, ear pain and nosebleeds.   Eyes: Negative for blurred vision, pain and discharge.  Respiratory: Negative for sputum production, shortness of breath, wheezing and stridor.   Cardiovascular: Negative for chest pain, palpitations, orthopnea and PND.  Gastrointestinal: Positive for abdominal pain. Negative for diarrhea, nausea and vomiting.  Genitourinary: Negative for frequency and urgency.  Musculoskeletal: Negative for back pain and joint pain.  Neurological: Negative for sensory change, speech change, focal weakness and weakness.  Psychiatric/Behavioral: Negative for depression and hallucinations. The patient is not nervous/anxious.    Tolerating Diet:yes Tolerating PT: ambulatory  DRUG ALLERGIES:  No Known Allergies  VITALS:  Blood pressure 135/77, pulse 79, temperature 98.6 F (37 C), temperature source Oral, resp. rate 18, height 5\' 7"  (1.702 m), weight 54 kg, SpO2 100 %.  PHYSICAL EXAMINATION:   Physical Exam  GENERAL:  62 y.o.-year-old patient lying in the bed with no acute distress.  EYES: Pupils equal, round, reactive to light and accommodation. No scleral icterus. Extraocular muscles intact.  HEENT: Head atraumatic, normocephalic. Oropharynx and nasopharynx clear.  NECK:  Supple, no jugular venous distention. No thyroid enlargement, no tenderness.  LUNGS: Normal breath sounds bilaterally, no wheezing, rales, rhonchi. No use of accessory muscles of respiration.  CARDIOVASCULAR: S1, S2 normal. No murmurs, rubs, or gallops.  ABDOMEN: Soft, nontender, nondistended. Bowel sounds present. No organomegaly or mass. Ileostomy and ostomy sites+ EXTREMITIES: No cyanosis, clubbing  or edema b/l.    NEUROLOGIC: Cranial nerves II through XII are intact. No focal Motor or sensory deficits b/l.   PSYCHIATRIC:  patient is alert and oriented x 3.  SKIN: No obvious rash, lesion, or ulcer.   LABORATORY PANEL:  CBC Recent Labs  Lab 09/04/18 1942  WBC 8.4  HGB 9.8*  HCT 30.6*  PLT 352    Chemistries  Recent Labs  Lab 09/04/18 1942 09/05/18 0810  NA 136  --   K 3.9  --   CL 109  --   CO2 17*  --   GLUCOSE 94  --   BUN 19  --   CREATININE 1.21*  --   CALCIUM 9.8  --   MG  --  2.1  AST 20  --   ALT 18  --   ALKPHOS 72  --   BILITOT 0.6  --    Cardiac Enzymes Recent Labs  Lab 09/04/18 1942  TROPONINI <0.03   RADIOLOGY:  No results found. 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 Abdominal pain has improved and patient having bowel movements -Surgery following-s/pdilation of the ileostomy11/10/2017 - plans for revision of ileostomy noted for Monday. -Hold eliquis -Continuesoft diet tolerating it well -prn pain meds and antiemetics  *Paroxysmal atrial fibrillation-in normal sinus rhythm here -hold eliquis -Continue Coreg  *Chronic systolic congestive heart failure- stable, no signs of volume overload -Continue Coreg  *Normocytic anemia- stable, hemoglobin at baseline  *Hypertension- BPs soft -Continue Coreg  *Depression- stable -Continue Celexa  *COPD-stable, no signs of acute exacerbation -Renew home inhalers  *DVT prophylaxis-SCD  Patient is best at baseline given her chronic medical problems. She understands the risk and benefits for surgery.  Case discussed with Care Management/Social Worker. Management plans discussed with the patient, family and they are in agreement.  CODE STATUS: full  DVT Prophylaxis: scd  TOTAL TIME TAKING CARE OF THIS PATIENT: *30* minutes.  >50% time spent on counselling and coordination of care  POSSIBLE D/C IN *2-3* DAYS,  DEPENDING ON CLINICAL CONDITION.  Note: This dictation was prepared with Dragon dictation along with smaller phrase technology. Any transcriptional errors that result from this process are unintentional.  Fritzi Mandes M.D on 09/08/2018 at 12:36 PM  Between 7am to 6pm - Pager - [phone]  After 6pm go to [email or web address] - password [email or web address] [facility]  Office  [phone]  CC: Primary care physician; Elisabeth Cara, NPPatient ID: Danielle [name], female   DOB: [birth year], 62 y.o.   MRN: [id number]

## 2018-09-08 NOTE — Op Note (Signed)
Preoperative diagnosis: End ileostomy stricture  Postoperative diagnosis: End ileostomy stricture.  Procedure:  Revision of end ileostomy with creation of new ileostomy   Anesthesia: GETA  Surgeon: Dr. Windell Moment, MD  Wound Classification: Dirty  Indications:  Patient is a 62 y.o. female who presented with ileostomy stricture. Patient had previous dilation of ileostomy with recurrent stricture causing obstruction.  Findings: 1. Severe stricture of the ileostomy at the skin level.  2. Viable healthy ileum. 3. Tension free ileostomy 4. Adequate hemostasis  Description of the procedure: Informed consent was obtained including a discussion of risks, benefits, and alternatives. The need for recreation of stoma was discussed with the patient. The patient was brought to the operating room and placed on the operating table in the supine position. General anesthesia was induced. A time-out was completed verifying correct patient, procedure, site, positioning, and implant(s) and/or special equipment prior to beginning this procedure. Perioperative antibiotics were given. The abdomen was prepped and draped in the usual sterile fashion.  A circular incision was made in the skin around the small skin opening and carried down through the subcutaneous tissues to the fascia. Carefully, adhesions were taken down with Metzenbaum scissors. The ileum was able to be freed from the anterior and posterior fascia and able to be eviscerated enough to be create the ileostomy without tension. The distal ileum as resected due to ischemic appearance down to the level of healthy ileum.  Care was taken to keep the ileum in its correct orientation and to avoid torsion. There was no tension on the ileum and adequate length was present.  The ileostomy was then everted and matured. An orienting suture were placed in each quadrant that went through the full thickness of the bowel at the cut edge, then through the seromuscular  layer 3-5 cm proximally, just adjacent the skin, and finally to the dermis. All orienting sutures were tied. Additional interrupted sutures were placed between the orienting sutures from the cut edge of the bowel to the dermis until the stoma was securely formed. An stoma appliance was placed and a sterile dressing was placed over the midline incision.  The patient tolerated the procedure well. All counts were correct. Anesthesia was reversed and the patient was extubated. The patient was taken to the postoperative care unit in stable condition.   Specimen: Ileostomy  Complications: None   Estimated Blood Loss: 50 mL

## 2018-09-08 NOTE — Transfer of Care (Signed)
Immediate Anesthesia Transfer of Care Note  Patient: Danielle [name]  Procedure(s) Performed: ILEOSTOMY REVISION POSSIBLE CREATION (N/A Abdomen)  Patient Location: PACU  Anesthesia Type:General  Level of Consciousness: sedated  Airway & Oxygen Therapy: Patient Spontanous Breathing and Patient connected to face mask oxygen  Post-op Assessment: Report given to RN and Post -op Vital signs reviewed and stable  Post vital signs: Reviewed and stable  Last Vitals:  Vitals Value Taken Time  BP 142/73 09/08/2018  4:42 PM  Temp    Pulse 87 09/08/2018  4:46 PM  Resp 20 09/08/2018  4:46 PM  SpO2 100 % 09/08/2018  4:46 PM  Vitals shown include unvalidated device data.  Last Pain:  Vitals:   09/08/18 1322  TempSrc: Temporal  PainSc:       Patients Stated Pain Goal: 2 (49/61/16 4353)  Complications: No apparent anesthesia complications

## 2018-09-08 NOTE — Anesthesia Procedure Notes (Signed)
Procedure Name: Intubation Date/Time: 09/08/2018 2:05 PM Performed by: Lowry Bowl, CRNA Pre-anesthesia Checklist: Patient identified, Emergency Drugs available, Suction available and Patient being monitored Patient Re-evaluated:Patient Re-evaluated prior to induction Oxygen Delivery Method: Circle system utilized Preoxygenation: Pre-oxygenation with 100% oxygen Induction Type: IV induction Ventilation: Mask ventilation without difficulty Laryngoscope Size: Mac and 3 Grade View: Grade II Tube type: Oral Tube size: 7.0 mm Number of attempts: 1 Airway Equipment and Method: Stylet Placement Confirmation: ETT inserted through vocal cords under direct vision,  positive ETCO2 and breath sounds checked- equal and bilateral Secured at: 22 cm Tube secured with: Tape Dental Injury: Teeth and Oropharynx as per pre-operative assessment

## 2018-09-08 NOTE — Progress Notes (Signed)
Pt transferring to [facility] after surgery. Report called to [place], all pt belongings (including glasses) taken to [place] by [place], [place].

## 2018-09-08 NOTE — Interval H&P Note (Signed)
History and Physical Interval Note:  09/08/2018 1:33 PM  Danielle [name]  has presented today for surgery, with the diagnosis of ileostomy stricture.  The various methods of treatment have been discussed with the patient and family. After consideration of risks, benefits and other options for treatment, the patient has consented to  Procedure(s): ILEOSTOMY REVISION POSSIBLE CREATION (N/A) as a surgical intervention .  The patient's history has been reviewed, patient examined, no change in status, stable for surgery.  I have reviewed the patient's chart and labs.  Questions were answered to the patient's satisfaction.     Herbert Pun

## 2018-09-08 NOTE — Consult Note (Signed)
[place] Nurse ostomy follow up Stoma type/location: RLQ ileostomy, recessed.  Surgical intervention planned for today.  Pouch is intact to ileostomy and LLQ mucus fistula as well. No needs at this time.  Will see postoperatively to manage and provide teachig for care of new stoma.  [place] team will follow.  Domenic Moras MSN, RN, FNP-BC CWON Wound, Ostomy, Continence Nurse Pager [phone]

## 2018-09-08 NOTE — Anesthesia Post-op Follow-up Note (Signed)
Anesthesia QCDR form completed.        

## 2018-09-08 NOTE — Anesthesia Preprocedure Evaluation (Addendum)
Anesthesia Evaluation  Patient identified by MRN, date of birth, ID band Patient awake    Reviewed: Allergy & Precautions, NPO status , Patient's Chart, lab work & pertinent test results, reviewed documented beta blocker date and time   Airway Mallampati: II  TM Distance: >3 FB     Dental  (+) Chipped, Poor Dentition, Missing   Pulmonary shortness of breath, COPD, former smoker,           Cardiovascular hypertension, Pt. on medications and Pt. on home beta blockers +CHF and + DOE  + dysrhythmias Atrial Fibrillation + Valvular Problems/Murmurs      Neuro/Psych    GI/Hepatic PUD,   Endo/Other  diabetes, Type 2  Renal/GU Renal disease     Musculoskeletal  (+) Arthritis ,   Abdominal   Peds  Hematology  (+) anemia ,   Anesthesia Other Findings Lung ca. BS this am 78. Hb 9.8. EKG shows poor R wave prog, otherwise ok. No cardiac symptoms. Echo shows 35-40.  Reproductive/Obstetrics                            Anesthesia Physical Anesthesia Plan  ASA: III  Anesthesia Plan: General   Post-op Pain Management:    Induction: Intravenous  PONV Risk Score and Plan:   Airway Management Planned: Oral ETT  Additional Equipment:   Intra-op Plan:   Post-operative Plan:   Informed Consent: I have reviewed the patients History and Physical, chart, labs and discussed the procedure including the risks, benefits and alternatives for the proposed anesthesia with the patient or authorized representative who has indicated his/her understanding and acceptance.     Plan Discussed with: CRNA  Anesthesia Plan Comments:         Anesthesia Quick Evaluation

## 2018-09-09 ENCOUNTER — Encounter: Payer: Self-pay | Admitting: General Surgery

## 2018-09-09 LAB — GLUCOSE, CAPILLARY
GLUCOSE-CAPILLARY: 160 mg/dL — AB (ref 70–99)
GLUCOSE-CAPILLARY: 115 mg/dL — AB (ref 70–99)
Glucose-Capillary: 83 mg/dL (ref 70–99)
Glucose-Capillary: 156 mg/dL — ABNORMAL HIGH (ref 70–99)

## 2018-09-09 LAB — COMPREHENSIVE METABOLIC PANEL
ALBUMIN: 3.4 g/dL — AB (ref 3.5–5.0)
ALT: 10 U/L (ref 0–44)
AST: 15 U/L (ref 15–41)
Alkaline Phosphatase: 50 U/L (ref 38–126)
Anion gap: 4 — ABNORMAL LOW (ref 5–15)
BILIRUBIN TOTAL: 0.8 mg/dL (ref 0.3–1.2)
BUN: 13 mg/dL (ref 8–23)
CHLORIDE: 112 mmol/L — AB (ref 98–111)
CO2: 21 mmol/L — AB (ref 22–32)
Calcium: 8.9 mg/dL (ref 8.9–10.3)
Creatinine, Ser: 0.83 mg/dL (ref 0.44–1.00)
GFR calc Af Amer: >60 mL/min (ref >60)
GFR calc non Af Amer: >60 mL/min (ref >60)
GLUCOSE: 93 mg/dL (ref 70–99)
POTASSIUM: 3.9 mmol/L (ref 3.5–5.1)
SODIUM: 137 mmol/L (ref 135–145)
TOTAL PROTEIN: 6.4 g/dL — AB (ref 6.5–8.1)

## 2018-09-09 LAB — CBC WITH DIFFERENTIAL/PLATELET
Abs Immature Granulocytes: 0.03 10*3/uL (ref 0.00–0.07)
BASOS ABS: 0 10*3/uL (ref 0.0–0.1)
Basophils Relative: 0 %
EOS ABS: 0.1 10*3/uL (ref 0.0–0.5)
Eosinophils Relative: 1 %
HEMATOCRIT: 26.3 % — AB (ref 36.0–46.0)
HEMOGLOBIN: 8.5 g/dL — AB (ref 12.0–15.0)
IMMATURE GRANULOCYTES: 0 %
LYMPHS ABS: 2 10*3/uL (ref 0.7–4.0)
LYMPHS PCT: 21 %
MCH: 30.1 pg (ref 26.0–34.0)
MCHC: 32.3 g/dL (ref 30.0–36.0)
MCV: 93.3 fL (ref 80.0–100.0)
Monocytes Absolute: 0.7 10*3/uL (ref 0.1–1.0)
Monocytes Relative: 7 %
NEUTROS PCT: 71 %
Neutro Abs: 7 10*3/uL (ref 1.7–7.7)
Platelets: 279 10*3/uL (ref 150–400)
RBC: 2.82 MIL/uL — AB (ref 3.87–5.11)
RDW: 14.6 % (ref 11.5–15.5)
WBC: 9.9 10*3/uL (ref 4.0–10.5)
nRBC: 0 % (ref 0.0–0.2)

## 2018-09-09 NOTE — Progress Notes (Signed)
[place] Hospital Day(s): 3.   Post op day(s): 1 Day Post-Op.   Interval History: Patient seen and examined, no acute events or new complaints overnight. Patient reports having pain on surgical area, denies nausea or vomiting.  Vital signs in last 24 hours: [min-max] current  Temp:  [97.7 F (36.5 C)-99.6 F (37.6 C)] 98.8 F (37.1 C) (11/26 0908) Pulse Rate:  [71-92] 88 (11/26 0908) Resp:  [11-22] 16 (11/26 0908) BP: (109-142)/(63-79) 134/78 (11/26 0908) SpO2:  [92 %-100 %] 94 % (11/26 0908) Weight:  [58 kg] 58 kg (11/26 0500)     Height: 5\' 7"  (170.2 cm) Weight: 58 kg BMI (Calculated): 20.02    Physical Exam:  Constitutional: alert, cooperative and no distress  Respiratory: breathing non-labored at rest  Cardiovascular: regular rate and sinus rhythm  Gastrointestinal: soft, non-tender, and non-distended. Ostomy pink and patent. No gas or stool seen on bag.   Labs:  CBC Latest Ref Rng & Units 09/09/2018 09/04/2018 09/02/2018  WBC 4.0 - 10.5 K/uL 9.9 8.4 8.4  Hemoglobin 12.0 - 15.0 g/dL 8.5(L) 9.8(L) 9.7(L)  Hematocrit 36.0 - 46.0 % 26.3(L) 30.6(L) 30.7(L)  Platelets 150 - 400 K/uL 279 352 341   CMP Latest Ref Rng & Units 09/09/2018 09/04/2018 09/02/2018  Glucose 70 - 99 mg/dL 93 94 79  BUN 8 - 23 mg/dL 13 19 18   Creatinine 0.44 - 1.00 mg/dL 0.83 1.21(H) 1.11(H)  Sodium 135 - 145 mmol/L 137 136 136  Potassium 3.5 - 5.1 mmol/L 3.9 3.9 4.0  Chloride 98 - 111 mmol/L 112(H) 109 113(H)  CO2 22 - 32 mmol/L 21(L) 17(L) 14(L)  Calcium 8.9 - 10.3 mg/dL 8.9 9.8 9.5  Total Protein 6.5 - 8.1 g/dL 6.4(L) 7.9 7.3  Total Bilirubin 0.3 - 1.2 mg/dL 0.8 0.6 0.6  Alkaline Phos 38 - 126 U/L 50 72 59  AST 15 - 41 U/L 15 20 17   ALT 0 - 44 U/L 10 18 14     Imaging studies: No new pertinent imaging studies   Assessment/Plan:  62 y.o. female with ileostomy stricture 1 Day Post-Op s/p revision of ileostomy, complicated by pertinent comorbidities includingCHF, atrial  fibrillation on anticoagulation, acute renal failure, COPD, history of lung and breast cancer. Patient has tolerated the procedure well. Still without sign of ostomy function return. Will progress diet to full liquid. Will continue with pain management. Encourage to ambulate.   Arnold Long, MD

## 2018-09-09 NOTE — Progress Notes (Signed)
[facility] [place] NAME: Tonantzin Mimnaugh    MR#:  [id number]  DATE OF BIRTH:  1955-10-23  SUBJECTIVE:   C/o abd pain POD # 1 revision of ileostomy due to stricture REVIEW OF SYSTEMS:   Review of Systems  Constitutional: Negative for chills, fever and weight loss.  HENT: Negative for ear discharge, ear pain and nosebleeds.   Eyes: Negative for blurred vision, pain and discharge.  Respiratory: Negative for sputum production, shortness of breath, wheezing and stridor.   Cardiovascular: Negative for chest pain, palpitations, orthopnea and PND.  Gastrointestinal: Positive for abdominal pain. Negative for diarrhea, nausea and vomiting.  Genitourinary: Negative for frequency and urgency.  Musculoskeletal: Negative for back pain and joint pain.  Neurological: Negative for sensory change, speech change, focal weakness and weakness.  Psychiatric/Behavioral: Negative for depression and hallucinations. The patient is not nervous/anxious.    Tolerating Diet:yes Tolerating PT: ambulatory  DRUG ALLERGIES:  No Known Allergies  VITALS:  Blood pressure 134/78, pulse 88, temperature 98.8 F (37.1 C), temperature source Oral, resp. rate 16, height 5\' 7"  (1.702 m), weight 58 kg, SpO2 94 %.  PHYSICAL EXAMINATION:   Physical Exam  GENERAL:  62 y.o.-year-old patient lying in the bed with no acute distress.  EYES: Pupils equal, round, reactive to light and accommodation. No scleral icterus. Extraocular muscles intact.  HEENT: Head atraumatic, normocephalic. Oropharynx and nasopharynx clear.  NECK:  Supple, no jugular venous distention. No thyroid enlargement, no tenderness.  LUNGS: Normal breath sounds bilaterally, no wheezing, rales, rhonchi. No use of accessory muscles of respiration.  CARDIOVASCULAR: S1, S2 normal. No murmurs, rubs, or gallops.  ABDOMEN: Soft, nontender, nondistended. Bowel sounds present. No organomegaly or mass. Ileostomy and  ostomy sites+ EXTREMITIES: No cyanosis, clubbing or edema b/l.    NEUROLOGIC: Cranial nerves II through XII are intact. No focal Motor or sensory deficits b/l.   PSYCHIATRIC:  patient is alert and oriented x 3.  SKIN: No obvious rash, lesion, or ulcer.   LABORATORY PANEL:  CBC Recent Labs  Lab 09/09/18 0622  WBC 9.9  HGB 8.5*  HCT 26.3*  PLT 279    Chemistries  Recent Labs  Lab 09/05/18 0810 09/09/18 0622  NA  --  137  K  --  3.9  CL  --  112*  CO2  --  21*  GLUCOSE  --  93  BUN  --  13  CREATININE  --  0.83  CALCIUM  --  8.9  MG 2.1  --   AST  --  15  ALT  --  10  ALKPHOS  --  50  BILITOT  --  0.8   Cardiac Enzymes Recent Labs  Lab 09/04/18 1942  TROPONINI <0.03   RADIOLOGY:  No results found. 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 Abdominal pain has improved and patient having bowel movements -Surgery following-s/pdilation of the ileostomy11/10/2017 - s/p revision of ileostomy POD #1 found to have severe stricture at the site neat the skin -Hold eliquis for 1 more day and start from tomorrow 11/27 per surgery -Continueclear liquid diet -prn pain meds and antiemetics  *Paroxysmal atrial fibrillation-in normal sinus rhythm here -hold eliquis one more day -Continue Coreg  *Chronic systolic congestive heart failure- stable, no signs of volume overload -Continue Coreg  *Normocytic anemia- stable, hemoglobin at baseline  *Hypertension- BPs soft -Continue Coreg  *Depression- stable -Continue Celexa  *COPD-stable, no  signs of acute exacerbation -Renew home inhalers  *DVT prophylaxis-SCD   Case discussed with Care Management/Social Worker. Management plans discussed with the patient, family and they are in agreement.  CODE STATUS: full  DVT Prophylaxis: scd  TOTAL TIME TAKING CARE OF THIS PATIENT: *30* minutes.  >50% time spent on counselling and coordination of  care  POSSIBLE D/C IN *couple* DAYS, DEPENDING ON CLINICAL CONDITION.  Note: This dictation was prepared with Dragon dictation along with smaller phrase technology. Any transcriptional errors that result from this process are unintentional.  Fritzi Mandes M.D on 09/09/2018 at 11:18 AM  Between 7am to 6pm - Pager - [phone]  After 6pm go to [email or web address] - password [email or web address] [facility]  Office  [phone]  CC: Primary care physician; Elisabeth Cara, NPPatient ID: [name], female   DOB: 1956/03/24, 62 y.o.   MRN: [id number]

## 2018-09-09 NOTE — Consult Note (Signed)
[place] Nurse ostomy follow up Stoma type/location: RLQ ileostomy.  Well budded and moist.   Stomal assessment/size: visualized through pouch  First pouch change in the AM.  Supplies at bedside.  Peristomal assessment: see above Treatment options for stomal/peristomal skin: Assess tomorrow Output brown liquid in pouch Ostomy pouching: 1pc.convex will likely be used Education provided: She agrees to pouch change in the AM.  She is in pain at this time.  Pouch is intact and stoma is pink, patent and producing liquid brown stool.  Enrolled patient in [place] Start Discharge program: Yes [place] team will follow.  Domenic Moras MSN, RN, FNP-BC CWON Wound, Ostomy, Continence Nurse Pager [phone]

## 2018-09-09 NOTE — Progress Notes (Signed)
Nutrition Follow Up Note   DOCUMENTATION CODES:   Non-severe (moderate) malnutrition in context of chronic illness  INTERVENTION:   Nepro Shake po BID, each supplement provides 425 kcal and 19 grams protein  MVI daily  NUTRITION DIAGNOSIS:   Moderate Malnutrition related to chronic illness(breast and lung cancer, COPD, CHF, multiple admits for SBO) as evidenced by moderate fat depletions, moderate to severe muscle depletions.  GOAL:   Patient will meet greater than or equal to 90% of their needs  -progressing   MONITOR:   Labs, Weight trends, Skin, I & O's, PO intake, Supplement acceptance  ASSESSMENT:   62 y.o. female with a known history of T2NIDDM, HTN, HLD, COPD (2L [place] O2 qHS/PRN), Hx L breast Ca Stage IIIA, T3N1M0, ER/PR (+), s/p MRM + ALND + chemo + XRT + Tamoxifen, Hx RML lung adenoCa (acinar + papillary, s/p XRT), Hx RUL NSCLC (Stage I, squamous, s/p SBRT), Hx LBIG (2/2 ischemic hepatic flexure bleeding ulcer, s/p R hemicolectomy + ileostomy; 05/04/2018) p/w intractable N/V/AP.    Pt s/p revision of end ileostomy with creation of new ileostomy 11/25  Pt eating 75-100% of meals prior to ileostomy revision; pt advanced to full liquids and eating only sips and bites today. Pt is drinking some Nepro and taking a daily MVI. Pt with 120ml type 7 stool via ostomy. Per chart, pt with ~9lb weight gain since admit; pt +2.6L on her I & Os. Recommend monitor fluid balance closely as pt with h/o CHF and has history of volume overload on TPN during previous admission. Recommend continue vitamins and supplements after discharge.   Medications reviewed and include: celexa, ferrous sulfate, lasix,insulin, megace, MVI, protonix, miralax, NaCl @50ml /hr  Labs reviewed: K 3.9 wnl P 5.4(H), Mg 2.1- 11/22 Hgb 8.5(L), Hct 26.3(L)  Diet Order:   Diet Order            Diet full liquid Room service appropriate? Yes; Fluid consistency: Thin  Diet effective now             EDUCATION  NEEDS:   No education needs have been identified at this time   Skin:  Skin Assessment: Reviewed RN Assessment  Last BM:  11/26- type 7 via ostomy   Height:   Ht Readings from Last 1 Encounters:  09/04/18 5\' 7"  (1.702 m)    Weight:   Wt Readings from Last 1 Encounters:  09/09/18 58 kg    Ideal Body Weight:  61.4 kg  BMI:  Body mass index is 20.03 kg/m.  Estimated Nutritional Needs:   Kcal:  1500-1800kcal/day   Protein:  75-85g/day   Fluid:  >1.5L/day   [name] MS, RD, LDN Pager #- [phone] Office#- [phone] After Hours Pager: [phone]

## 2018-09-09 NOTE — Progress Notes (Signed)
Unable to find abdominal incision as noted in LDAs, other than drain site, covered by appliance on LUQ and Ileostomy on RUQ; Arleigh Odowd K

## 2018-09-09 NOTE — Anesthesia Postprocedure Evaluation (Signed)
Anesthesia Post Note  Patient: Danielle [name]  Procedure(s) Performed: ILEOSTOMY REVISION POSSIBLE CREATION (N/A Abdomen)  Patient location during evaluation: PACU Anesthesia Type: General Level of consciousness: awake and alert Pain management: pain level controlled Vital Signs Assessment: post-procedure vital signs reviewed and stable Respiratory status: spontaneous breathing, nonlabored ventilation, respiratory function stable and patient connected to nasal cannula oxygen Cardiovascular status: blood pressure returned to baseline and stable Postop Assessment: no apparent nausea or vomiting Anesthetic complications: no     Last Vitals:  Vitals:   09/09/18 0114 09/09/18 0459  BP: 122/72 118/70  Pulse: 89 92  Resp:  20  Temp:  37.4 C  SpO2:  94%    Last Pain:  Vitals:   09/09/18 0508  TempSrc:   PainSc: [place] S

## 2018-09-10 ENCOUNTER — Telehealth: Payer: Self-pay | Admitting: Pharmacy Technician

## 2018-09-10 LAB — GLUCOSE, CAPILLARY: Glucose-Capillary: 91 mg/dL (ref 70–99)

## 2018-09-10 LAB — SURGICAL PATHOLOGY

## 2018-09-10 MED ORDER — HYDROCODONE-ACETAMINOPHEN 5-325 MG PO TABS: 1.0000 | ORAL_TABLET | ORAL | 0 refills | Status: AC | PRN

## 2018-09-10 MED ORDER — APIXABAN 5 MG PO TABS
5.0000 mg | ORAL_TABLET | Freq: Two times a day (BID) | ORAL | Status: DC
  Administered 2018-09-10: 5 mg via ORAL
  Filled 2018-09-10: qty 1

## 2018-09-10 NOTE — Discharge Summary (Signed)
Patient ID: Danielle [name] MRN: [id number] DOB/AGE: Dec 09, 1955 62 y.o.  Admit date: 09/05/2018 Discharge date: 09/10/2018   Discharge Diagnoses:  Active Problems:   SBO (small bowel obstruction) (HCC)   Intractable nausea and vomiting   Procedures: Ileostomy revision  Hospital Course: Patient was admitted due to abdominal pain nausea and vomiting. It was found that the ileostomy stricture recurrent. Anticoagulation was placed on hold and ileostomy revision was done. Patient tolerated procedure well. Pain is better (mild) than before surgery controlled with oral pain medications. Patient is tolerating solid diet and ileostomy is passing adequate amount of stool. Patient already knows how to take care of ileostomy. Patient also feeling comfortable.   Physical Exam  Constitutional: She is well-developed, well-nourished, and in no distress.  HENT:  Head: Normocephalic.  Neck: Normal range of motion.  Cardiovascular: Normal rate and regular rhythm.  Pulmonary/Chest: Effort normal. No respiratory distress.  Abdominal: Soft. She exhibits no distension. There is no tenderness. There is no rebound.  Ileostomy pink and patent with stool in bag.    Consults: Internal Medicine  Disposition: Discharge disposition: 01-Home or Self Care       Discharge Instructions    Diet - low sodium heart healthy   Complete by:  As directed    Increase activity slowly   Complete by:  As directed      Allergies as of 09/10/2018   No Known Allergies     Medication List    TAKE these medications   albuterol 108 (90 Base) MCG/ACT inhaler Commonly known as:  PROVENTIL HFA;VENTOLIN HFA Inhale 2 puffs into the lungs every 6 (six) hours as needed for wheezing or shortness of breath.   apixaban 5 MG Tabs tablet Commonly known as:  ELIQUIS Take 1 tablet (5 mg total) by mouth 2 (two) times daily.   budesonide-formoterol 160-4.5 MCG/ACT inhaler Commonly known as:  SYMBICORT Inhale 2 puffs into  the lungs 2 (two) times daily.   carvedilol 6.25 MG tablet Commonly known as:  COREG Take 1 tablet (6.25 mg total) by mouth 2 (two) times daily.   citalopram 40 MG tablet Commonly known as:  CELEXA Take 40 mg by mouth daily.   ferrous sulfate 325 (65 FE) MG tablet Take 1 tablet (325 mg total) by mouth 2 (two) times daily with a meal.   furosemide 20 MG tablet Commonly known as:  LASIX Take 20 mg by mouth daily.   HYDROcodone-acetaminophen 5-325 MG tablet Commonly known as:  NORCO/VICODIN Take 1 tablet by mouth every 6 (six) hours as needed for moderate pain or severe pain. What changed:  Another medication with the same name was added. Make sure you understand how and when to take each.   HYDROcodone-acetaminophen 5-325 MG tablet Commonly known as:  NORCO/VICODIN Take 1 tablet by mouth every 4 (four) hours as needed for up to 3 days for moderate pain. What changed:  You were already taking a medication with the same name, and this prescription was added. Make sure you understand how and when to take each.   megestrol 400 MG/10ML suspension Commonly known as:  MEGACE Take 7.5 mLs (300 mg total) by mouth 2 (two) times daily.   multivitamin with minerals Tabs tablet Take 1 tablet by mouth daily.   pantoprazole 40 MG tablet Commonly known as:  PROTONIX Take 1 tablet (40 mg total) by mouth 2 (two) times daily before a meal.   POTASSIMIN PO Take 1 tablet by mouth daily as needed.   traMADol  50 MG tablet Commonly known as:  ULTRAM Take 50 mg by mouth every 6 (six) hours as needed. for pain      Follow-up Information    Herbert Pun, MD Follow up in 1 week(s).   Specialty:  General Surgery Contact information: [address] [place] [address] [phone]

## 2018-09-10 NOTE — Discharge Instructions (Signed)
°  Diet: Resume home heart healthy regular diet.   Activity: No heavy lifting >20 pounds (children, pets, laundry, garbage) or strenuous activity until follow-up, but light activity and walking are encouraged. Do not drive or drink alcohol if taking narcotic pain medications.  Wound care: Continue care of ileostomy as you were doing before surgery. Same ileostomy changes and care.   Medications: Resume all home medications including the blood thinner (Eliquis). For mild to moderate pain: acetaminophen (Tylenol) or ibuprofen (if no kidney disease). Combining Tylenol with alcohol can substantially increase your risk of causing liver disease. Narcotic pain medications, if prescribed, can be used for severe pain, though may cause nausea, constipation, and drowsiness. Do not combine Tylenol and Norco within a 6 hour period as Norco contains Tylenol. If you do not need the narcotic pain medication, you do not need to fill the prescription.  Call office [phone]) at any time if any questions, worsening pain, fevers/chills, bleeding, drainage from incision site, or other concerns.

## 2018-09-10 NOTE — Progress Notes (Signed)
Patient cleared for discharge. Right Ostomy bag changed. IV removed. Instructions given and explained thoroughly for patient clarification. F/u appointment scheduled. Pharmacy verified. Belongings gathered. Patient discharged to home via POV.

## 2018-09-10 NOTE — Consult Note (Signed)
[place] Nurse ostomy follow up Well known to [facility] team.  Well budded ileostomy.  Pouch change performed Stoma type/location: RLQ ileostomy with LLQ mucus fistula Stomal assessment/size: 1" pink and moist and producing brown stool  Peristomal assessment: intact Treatment options for stomal/peristomal skin: barrier ring Output soft brown stool Ostomy pouching: 2pc. With barrier ring for each.  Supplies provided for discharge.   Education provided: Patient visualizes new stoma and is pleased.  We are optimistic for an improved wear time and improved quality of life, free from unplanned leaks.  Enrolled patient in [place] Start Discharge program: No Will not follow at this time.  Please re-consult if needed. Discharging today Domenic Moras MSN, RN, FNP-BC CWON Wound, Ostomy, Continence Nurse Pager [phone]

## 2018-09-10 NOTE — Telephone Encounter (Signed)
Patient has Medicaid.  No longer meets [facility]'s eligibility criteria.  Pt notified.  [facility]

## 2018-09-10 NOTE — Progress Notes (Signed)
[name] [facility] [place] NAME: Danielle [name]    MR#:  [id number]  DATE OF BIRTH:  10-21-1955  SUBJECTIVE:   C/o abd pain POD # 2 revision of ileostomy due to stricture. Doing better REVIEW OF SYSTEMS:   Review of Systems  Constitutional: Negative for chills, fever and weight loss.  HENT: Negative for ear discharge, ear pain and nosebleeds.   Eyes: Negative for blurred vision, pain and discharge.  Respiratory: Negative for sputum production, shortness of breath, wheezing and stridor.   Cardiovascular: Negative for chest pain, palpitations, orthopnea and PND.  Gastrointestinal: Positive for abdominal pain. Negative for diarrhea, nausea and vomiting.  Genitourinary: Negative for frequency and urgency.  Musculoskeletal: Negative for back pain and joint pain.  Neurological: Negative for sensory change, speech change, focal weakness and weakness.  Psychiatric/Behavioral: Negative for depression and hallucinations. The patient is not nervous/anxious.    Tolerating Diet:yes Tolerating PT: ambulatory  DRUG ALLERGIES:  No Known Allergies  VITALS:  Blood pressure 127/67, pulse 88, temperature 98.6 F (37 C), temperature source Oral, resp. rate 16, height 5\' 7"  (1.702 m), weight 57.3 kg, SpO2 96 %.  PHYSICAL EXAMINATION:   Physical Exam  GENERAL:  62 y.o.-year-old patient lying in the bed with no acute distress.  EYES: Pupils equal, round, reactive to light and accommodation. No scleral icterus. Extraocular muscles intact.  HEENT: Head atraumatic, normocephalic. Oropharynx and nasopharynx clear.  NECK:  Supple, no jugular venous distention. No thyroid enlargement, no tenderness.  LUNGS: Normal breath sounds bilaterally, no wheezing, rales, rhonchi. No use of accessory muscles of respiration.  CARDIOVASCULAR: S1, S2 normal. No murmurs, rubs, or gallops.  ABDOMEN: Soft, nontender, nondistended. Bowel sounds present. No organomegaly or mass.  Ileostomy and ostomy sites+ EXTREMITIES: No cyanosis, clubbing or edema b/l.    NEUROLOGIC: Cranial nerves II through XII are intact. No focal Motor or sensory deficits b/l.   PSYCHIATRIC:  patient is alert and oriented x 3.  SKIN: No obvious rash, lesion, or ulcer.   LABORATORY PANEL:  CBC Recent Labs  Lab 09/09/18 0622  WBC 9.9  HGB 8.5*  HCT 26.3*  PLT 279    Chemistries  Recent Labs  Lab 09/05/18 0810 09/09/18 0622  NA  --  137  K  --  3.9  CL  --  112*  CO2  --  21*  GLUCOSE  --  93  BUN  --  13  CREATININE  --  0.83  CALCIUM  --  8.9  MG 2.1  --   AST  --  15  ALT  --  10  ALKPHOS  --  50  BILITOT  --  0.8   Cardiac Enzymes Recent Labs  Lab 09/04/18 1942  TROPONINI <0.03   RADIOLOGY:  No results found. 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 Management plans discussed with the patient, family and they are in agreement.  CODE STATUS: full  DVT Prophylaxis: scd  TOTAL TIME TAKING CARE OF THIS PATIENT: *20 minutes.  >50% time spent on counselling and coordination of care    Note: This dictation was  prepared with Dragon dictation along with smaller phrase technology. Any transcriptional errors that result from this process are unintentional.  Fritzi Mandes M.D on 09/10/2018 at 10:37 AM  Between 7am to 6pm - Pager - [phone]  After 6pm go to [email or web address] - password [email or web address] [facility]  Office  [phone]  CC: Primary care physician; Elisabeth Cara, NPPatient ID: Danielle [name], female   DOB: 07/26/1956, 62 y.o.   MRN: [id number]

## 2018-09-15 ENCOUNTER — Telehealth: Payer: Self-pay

## 2018-09-15 NOTE — Telephone Encounter (Signed)
EMMI Follow-up: Danielle [name] had received an automated call from my number and was returning it to see why we had called.  I let her know of our process of two automated calls post discharge and she would receive another one in a couple of days.  Said she had picked up her Rx and was aware of appointments.  She needed to call Dr. Windell Moment to schedule a morning appointment as she couldn't make it to the afternoon appointment. No other needs noted.

## 2018-09-19 ENCOUNTER — Encounter: Payer: Self-pay | Admitting: Emergency Medicine

## 2018-09-19 ENCOUNTER — Ambulatory Visit: Admission: RE | Admit: 2018-09-19 | Payer: Medicaid Other | Source: Ambulatory Visit

## 2018-09-19 ENCOUNTER — Other Ambulatory Visit: Payer: Self-pay | Admitting: *Deleted

## 2018-09-19 DIAGNOSIS — Y929 Unspecified place or not applicable: Secondary | ICD-10-CM | POA: Insufficient documentation

## 2018-09-19 DIAGNOSIS — Y999 Unspecified external cause status: Secondary | ICD-10-CM | POA: Insufficient documentation

## 2018-09-19 DIAGNOSIS — I110 Hypertensive heart disease with heart failure: Secondary | ICD-10-CM | POA: Insufficient documentation

## 2018-09-19 DIAGNOSIS — Z7901 Long term (current) use of anticoagulants: Secondary | ICD-10-CM | POA: Insufficient documentation

## 2018-09-19 DIAGNOSIS — Z79899 Other long term (current) drug therapy: Secondary | ICD-10-CM | POA: Diagnosis not present

## 2018-09-19 DIAGNOSIS — T2140XA Corrosion of unspecified degree of trunk, unspecified site, initial encounter: Secondary | ICD-10-CM | POA: Diagnosis not present

## 2018-09-19 DIAGNOSIS — I509 Heart failure, unspecified: Secondary | ICD-10-CM | POA: Diagnosis not present

## 2018-09-19 DIAGNOSIS — T6591XA Toxic effect of unspecified substance, accidental (unintentional), initial encounter: Secondary | ICD-10-CM | POA: Diagnosis not present

## 2018-09-19 DIAGNOSIS — G8918 Other acute postprocedural pain: Secondary | ICD-10-CM | POA: Diagnosis present

## 2018-09-19 DIAGNOSIS — J449 Chronic obstructive pulmonary disease, unspecified: Secondary | ICD-10-CM | POA: Diagnosis not present

## 2018-09-19 DIAGNOSIS — X58XXXA Exposure to other specified factors, initial encounter: Secondary | ICD-10-CM | POA: Diagnosis not present

## 2018-09-19 DIAGNOSIS — Y939 Activity, unspecified: Secondary | ICD-10-CM | POA: Diagnosis not present

## 2018-09-19 DIAGNOSIS — E119 Type 2 diabetes mellitus without complications: Secondary | ICD-10-CM | POA: Diagnosis not present

## 2018-09-19 DIAGNOSIS — C3411 Malignant neoplasm of upper lobe, right bronchus or lung: Secondary | ICD-10-CM

## 2018-09-19 DIAGNOSIS — Z853 Personal history of malignant neoplasm of breast: Secondary | ICD-10-CM | POA: Diagnosis not present

## 2018-09-19 DIAGNOSIS — Z8719 Personal history of other diseases of the digestive system: Secondary | ICD-10-CM | POA: Insufficient documentation

## 2018-09-19 NOTE — ED Triage Notes (Signed)
Patient had an ileostomy placed in July. Patient had a revision on the ileostomy about 2 weeks ago. Patient states that since her revision her ileostomy has been leaking frequently and now her skin is irritated.

## 2018-09-20 ENCOUNTER — Emergency Department
Admission: EM | Admit: 2018-09-20 | Discharge: 2018-09-20 | Disposition: A | Discharge status: Home / Self Care | Payer: Medicaid Other | Attending: Emergency Medicine | Admitting: Emergency Medicine

## 2018-09-20 DIAGNOSIS — T304 Corrosion of unspecified body region, unspecified degree: Secondary | ICD-10-CM

## 2018-09-20 MED ORDER — HYDROCODONE-ACETAMINOPHEN 5-325 MG PO TABS
2.0000 | ORAL_TABLET | Freq: Once | ORAL | Status: AC
  Administered 2018-09-20: 2 via ORAL
  Filled 2018-09-20: qty 2

## 2018-09-20 MED ORDER — FENTANYL CITRATE (PF) 100 MCG/2ML IJ SOLN
100.0000 ug | Freq: Once | INTRAMUSCULAR | Status: AC
  Administered 2018-09-20: 100 ug via NASAL
  Filled 2018-09-20: qty 2

## 2018-09-20 MED ORDER — SILVER SULFADIAZINE 1 % EX CREA
TOPICAL_CREAM | Freq: Once | CUTANEOUS | Status: AC
  Administered 2018-09-20: 04:00:00 via TOPICAL

## 2018-09-20 MED ORDER — SILVER SULFADIAZINE 1 % EX CREA
TOPICAL_CREAM | CUTANEOUS | Status: AC
  Filled 2018-09-20: qty 85

## 2018-09-20 NOTE — ED Provider Notes (Signed)
[facility] Emergency Department Provider Note  ____________________________________________   First MD Initiated Contact with Patient 09/20/18 0407     (approximate)  I have reviewed the triage vital signs and the nursing notes.   HISTORY  Chief Complaint Post-op Problem   HPI Danielle [name] is a 62 y.o. female who self presents to the emergency department with painful swelling around her ileostomy site.  She had an ileostomy placed in July and it was most recently dilated about 2 weeks ago.  She has had persistent difficulty with ileostomy leaking and contact dermatitis.  It is gotten progressively worse over the past 3 days or so.  She now has moderate to severe constant aching in her right upper abdomen.  She finds it quite challenging to seal off the ileostomy completely.  Denies fevers or chills.  She is able to eat and drink.  Symptoms are currently severe constant and nothing seems to make them better or worse.  They are nonradiating.    Past Medical History:  Diagnosis Date  . Anxiety   . Arthritis   . Cancer ([place]) left    breast cancer 2000, chemo tx's with total mastectomy and lymph nodes resected.   . Cancer of right lung ([place]) 02/21/2016   rad tx's.   . CHF (congestive heart failure) ([place])   . COPD (chronic obstructive pulmonary disease) ([place])   . Dependence on supplemental oxygen   . Depression   . Diabetes mellitus without complication ([place])   . Heart murmur   . Hypertension   . Lung nodule   . Lymphedema   . Shortness of breath dyspnea    with exertion  . Status post chemotherapy 2001   left breast cancer  . Status post radiation therapy 2001   left breast cancer    Patient Active Problem List   Diagnosis Date Noted  . Intractable nausea and vomiting 09/05/2018  . Abdominal pain 08/12/2018  . Reflux esophagitis   . Iron deficiency anemia secondary to blood loss (chronic)   . SBO (small bowel obstruction) ([place]) 06/29/2018  .  Chronic systolic heart failure ([place]) 06/09/2018  . HTN (hypertension) 06/09/2018  . Atrial fibrillation ([place]) 06/09/2018  . Lymphedema 06/09/2018  . ARF (acute renal failure) ([place]) 06/04/2018  . Protein-calorie malnutrition, severe 06/04/2018  . Blood in stool   . Focal (segmental) acute (reversible) ischemia of large intestine ([place])   . Ulceration of intestine   . Abdominal pain, right upper quadrant   . Diverticulosis of large intestine without diverticulitis   . Abnormal CT scan, colon   . Generalized abdominal pain   . Colitis   . COPD exacerbation ([place])   . Malnutrition of moderate degree 04/23/2018  . Palliative care by specialist   . DNR (do not resuscitate) discussion   . Weakness generalized   . Hyponatremia 02/10/2017  . Syncope 02/09/2017  . Lung nodule, solitary 07/25/2016  . Malignant neoplasm of upper lobe of right lung ([place]) 07/25/2016  . Carcinoma of overlapping sites of left breast in female, estrogen receptor positive ([place]) 06/22/2016  . Multiple lung nodules 06/22/2016  . Cough   . Lesion of right lung   . Breast cancer in female [facility]) 02/21/2016  . DOE (dyspnea on exertion) 02/14/2016  . Moderate COPD (chronic obstructive pulmonary disease) ([place]) 08/24/2014    Past Surgical History:  Procedure Laterality Date  . Breast Biospy Left    [facility]  . BREAST SURGERY    . COLONOSCOPY  N/A 04/30/2018   Procedure: COLONOSCOPY;  Surgeon: Virgel Manifold, MD;  Location: [facility] ENDOSCOPY;  Service: Endoscopy;  Laterality: N/A;  . COLONOSCOPY N/A 07/22/2018   Procedure: COLONOSCOPY;  Surgeon: Virgel Manifold, MD;  Location: [facility] ENDOSCOPY;  Service: Endoscopy;  Laterality: N/A;  . DILATION AND CURETTAGE OF UTERUS    . ELECTROMAGNETIC NAVIGATION BROCHOSCOPY Right 04/11/2016   Procedure: ELECTROMAGNETIC NAVIGATION BRONCHOSCOPY;  Surgeon: Vilinda Boehringer, MD;  Location: [facility] ORS;  Service: Cardiopulmonary;  Laterality: Right;  . ESOPHAGOGASTRODUODENOSCOPY N/A 07/22/2018     Procedure: ESOPHAGOGASTRODUODENOSCOPY (EGD);  Surgeon: Virgel Manifold, MD;  Location: [facility] ENDOSCOPY;  Service: Endoscopy;  Laterality: N/A;  . ESOPHAGOGASTRODUODENOSCOPY (EGD) WITH PROPOFOL N/A 05/07/2018   Procedure: ESOPHAGOGASTRODUODENOSCOPY (EGD) WITH PROPOFOL;  Surgeon: Lucilla Lame, MD;  Location[facility] ENDOSCOPY;  Service: Endoscopy;  Laterality: N/A;  . history of colonoscopy]    . ILEOSCOPY N/A 07/22/2018   Procedure: ILEOSCOPY THROUGH STOMA;  Surgeon: Virgel Manifold, MD;  Location: [facility] ENDOSCOPY;  Service: Endoscopy;  Laterality: N/A;  . ILEOSTOMY    . ILEOSTOMY N/A 09/08/2018   Procedure: ILEOSTOMY REVISION POSSIBLE CREATION;  Surgeon: Herbert Pun, MD;  Location: [facility] ORS;  Service: General;  Laterality: N/A;  . ILEOSTOMY CLOSURE N/A 08/15/2018   Procedure: DILATION OF ILEOSTOMY STRICTURE;  Surgeon: Herbert Pun, MD;  Location: [facility] ORS;  Service: General;  Laterality: N/A;  . LAPAROTOMY Right 05/04/2018   Procedure: EXPLORATORY LAPAROTOMY right colectomy right and left ostomy;  Surgeon: Herbert Pun, MD;  Location: [facility] ORS;  Service: General;  Laterality: Right;  . LUNG BIOPSY    . MASTECTOMY Left    2000, [facility]  . ROTATOR CUFF REPAIR Right    [facility]    Prior to Admission medications   Medication Sig Start Date End Date Taking? Authorizing Provider  albuterol (PROVENTIL HFA;VENTOLIN HFA) 108 (90 Base) MCG/ACT inhaler Inhale 2 puffs into the lungs every 6 (six) hours as needed for wheezing or shortness of breath. 04/24/18   Demetrios Loll, MD  apixaban (ELIQUIS) 5 MG TABS tablet Take 1 tablet (5 mg total) by mouth 2 (two) times daily. 06/23/18   [name], MD  budesonide-formoterol (SYMBICORT) 160-4.5 MCG/ACT inhaler Inhale 2 puffs into the lungs 2 (two) times daily. 04/24/18   Demetrios Loll, MD  carvedilol (COREG) 6.25 MG tablet Take 1 tablet (6.25 mg total) by mouth 2 (two) times daily. 06/24/18   [name], FNP  citalopram (CELEXA)  40 MG tablet Take 40 mg by mouth daily.    [provider]  ferrous sulfate 325 (65 FE) MG tablet Take 1 tablet (325 mg total) by mouth 2 (two) times daily with a meal. 07/02/18   [name], MD  furosemide (LASIX) 20 MG tablet Take 20 mg by mouth daily.     [provider]  HYDROcodone-acetaminophen (NORCO/VICODIN) 5-325 MG tablet Take 1 tablet by mouth every 6 (six) hours as needed for moderate pain or severe pain.    [provider]  megestrol (MEGACE) 400 MG/10ML suspension Take 7.5 mLs (300 mg total) by mouth 2 (two) times daily. 06/05/18   Fritzi Mandes, MD  Multiple Vitamin (MULTIVITAMIN WITH MINERALS) TABS tablet Take 1 tablet by mouth daily. 06/06/18   Fritzi Mandes, MD  pantoprazole (PROTONIX) 40 MG tablet Take 1 tablet (40 mg total) by mouth 2 (two) times daily before a meal. 07/23/18   Sudini, Srikar, MD  Potassium (POTASSIMIN PO) Take 1 tablet by mouth daily as needed.    [provider]  traMADol (ULTRAM) 50 MG tablet Take 50 mg by mouth every 6 (six) hours as needed. for pain 07/28/18   [provider]    Allergies Patient has no known allergies.  Family History  Problem Relation Age of Onset  . Breast cancer Mother 35  . Cancer Mother        Breast   . Cirrhosis Father   . Breast cancer Paternal Aunt 61  . Cancer Maternal Aunt        Breast     Social History Social History   Tobacco Use  . Smoking status: Former Smoker    Packs/day: 0.50    Years: 20.00    Pack years: 10.00    Types: Cigarettes    Last attempt to quit: 07/02/2012    Years since quitting: 6.2  . Smokeless tobacco: Never Used  . Tobacco comment: quit 2014  Substance Use Topics  . Alcohol use: Yes    Comment: Occasionally  . Drug use: No    Review of Systems Constitutional: No fever/chills Eyes: No visual changes. ENT: No sore throat. Cardiovascular: Denies chest pain. Respiratory: Denies shortness of breath. Gastrointestinal: No abdominal  pain.  No nausea, no vomiting.  No diarrhea.  No constipation. Genitourinary: Negative for dysuria. Musculoskeletal: Negative for back pain. Skin: Positive for rash Neurological: Negative for headaches, focal weakness or numbness.   ____________________________________________   PHYSICAL EXAM:  VITAL SIGNS: ED Triage Vitals  Enc Vitals Group     BP 09/20/18 0015 105/77     Pulse Rate 09/20/18 0015 97     Resp 09/20/18 0015 18     Temp 09/20/18 0015 98.7 F (37.1 C)     Temp Source 09/20/18 0015 Oral     SpO2 09/20/18 0015 99 %     Weight 09/19/18 2354 126 lb 4.8 oz (57.3 kg)     Height 09/19/18 2354 5\' 7"  (1.702 m)     Head Circumference --      Peak Flow --      Pain Score 09/19/18 2354 8     Pain Loc --      Pain Edu? --      Excl. in [place]? --     Constitutional: Alert and oriented x4 appears obviously quite uncomfortable though nontoxic no diaphoresis speaks full clear sentences Eyes: PERRL EOMI. Head: Atraumatic. Nose: No congestion/rhinnorhea. Mouth/Throat: No trismus Neck: No stridor.   Cardiovascular: Normal rate, regular rhythm. Grossly normal heart sounds.  Good peripheral circulation. Respiratory: Normal respiratory effort.  No retractions. Lungs CTAB and moving good air Gastrointestinal: Soft nontender.  Ileostomy and colostomy are both patent pink and productive Musculoskeletal: No lower extremity edema   Neurologic:  Normal speech and language. No gross focal neurologic deficits are appreciated. Skin: Chemical dermatitis surrounding the ileostomy site Psychiatric: Mood and affect are normal. Speech and behavior are normal.    ____________________________________________   DIFFERENTIAL includes but not limited to  Chemical dermatitis, bowel obstruction, sepsis, cellulitis ____________________________________________   LABS (all labs ordered are listed, but only abnormal results are displayed)  Labs Reviewed - No data to  display   __________________________________________  EKG   ____________________________________________  RADIOLOGY   ____________________________________________   PROCEDURES  Procedure(s) performed: no  Procedures  Critical Care performed: no  ____________________________________________   INITIAL IMPRESSION / ASSESSMENT AND PLAN / ED COURSE  Pertinent labs & imaging results that were available during my care of the patient were reviewed by me and considered in  my medical decision making (see chart for details).   As part of my medical decision making, I reviewed the following data within the [place] History obtained from family if available, nursing notes, old chart and ekg, as well as notes from prior ED visits.  The patient is quite uncomfortable appearing with an obvious ileostomy chemical dermatitis.  Given intranasal fentanyl for pain control prior to evaluation.  I removed her ileostomy bag and cleansed the skin.  Then used benzoin and reshaped a new ileostomy ring that fit quite tightly.  I did offer the patient inpatient admission for wound care however she declined.  She be discharged home with Silvadene cream and surgery follow-up as scheduled.  Strict return precautions have been given.      ____________________________________________   FINAL CLINICAL IMPRESSION(S) / ED DIAGNOSES  Final diagnoses:  Chemical burn      NEW MEDICATIONS STARTED DURING THIS VISIT:  Discharge Medication List as of 09/20/2018  5:14 AM       Note:  This document was prepared using Dragon voice recognition software and may include unintentional dictation errors.     Darel Hong, MD 09/22/18 1015

## 2018-09-20 NOTE — Discharge Instructions (Signed)
It was a pleasure to take care of you today, and thank you for coming to our emergency department.  If you have any questions or concerns before leaving please ask the nurse to grab me and I'm more than happy to go through your aftercare instructions again.  If you have any concerns once you are home that you are not improving or are in fact getting worse before you can make it to your follow-up appointment, please do not hesitate to call 911 and come back for further evaluation.  Darel Hong, MD

## 2018-09-21 NOTE — Progress Notes (Deleted)
Patient ID: CASHE GATT, female    DOB: [birth year], 61 y.o.   MRN: [id number]  HPI  Ms Tauzin is a 62 y/o female with a history of breast/ lung cancer, DM, HTN, COPD, anxiety, depression, lymphedema, previous tobacco use and chronic heart failure.   Echo report from 04/21/18 reviewed and showed an EF of 35-40% along with mild AS/ trivial AR and moderate MR.   In the last 3 months, patient has been admitted 5 times and been to the ED 3 times with the most recent being 09/20/18. Admitted 06/03/18 due to acute renal failure. Given IV hydration with improvement of renal function. Wound care consult obtained due to leakage from ostomy site. Discharged after 2 days. Was in the ED 05/23/18 due to fever and redness around ostomy site. Treated with antibiotics and released. Was in the ED 05/20/18 due to rectal bleeding. Surgery saw patient and she was released. Admitted 04/21/18 due to COPD/HF exacerbation. Initially needed IV lasix and then transitioned to oral diuretics along with prednisone. Pulmonology, vascular, cardiology, surgery, GI, oncology and palliative care consults were all obtained. Head CT done due to acute encephalopathy which was negative. Needed TPN due to severe malnutrition and then advanced to a soft diet. Had ischemic colitis which required surgery (right hemicolectomy and an ileostomy). Discharged after 26 days.   She presents today for a follow-up visit with a chief complaint of   Past Medical History:  Diagnosis Date  . Anxiety   . Arthritis   . Cancer ([place]) left    breast cancer 2000, chemo tx's with total mastectomy and lymph nodes resected.   . Cancer of right lung ([place]) 02/21/2016   rad tx's.   . CHF (congestive heart failure) ([place])   . COPD (chronic obstructive pulmonary disease) ([place])   . Dependence on supplemental oxygen   . Depression   . Diabetes mellitus without complication ([place])   . Heart murmur   . Hypertension   . Lung nodule   . Lymphedema   . Shortness of breath  dyspnea    with exertion  . Status post chemotherapy 2001   left breast cancer  . Status post radiation therapy 2001   left breast cancer   Past Surgical History:  Procedure Laterality Date  . Breast Biospy Left    [facility]  . BREAST SURGERY    . COLONOSCOPY N/A 04/30/2018   Procedure: COLONOSCOPY;  Surgeon: Virgel Manifold, MD;  Location: [facility] ENDOSCOPY;  Service: Endoscopy;  Laterality: N/A;  . COLONOSCOPY N/A 07/22/2018   Procedure: COLONOSCOPY;  Surgeon: Virgel Manifold, MD;  Location: [facility] ENDOSCOPY;  Service: Endoscopy;  Laterality: N/A;  . DILATION AND CURETTAGE OF UTERUS    . ELECTROMAGNETIC NAVIGATION BROCHOSCOPY Right 04/11/2016   Procedure: ELECTROMAGNETIC NAVIGATION BRONCHOSCOPY;  Surgeon: Vilinda Boehringer, MD;  Location: [facility] ORS;  Service: Cardiopulmonary;  Laterality: Right;  . ESOPHAGOGASTRODUODENOSCOPY N/A 07/22/2018   Procedure: ESOPHAGOGASTRODUODENOSCOPY (EGD);  Surgeon: Virgel Manifold, MD;  Location[facility] ENDOSCOPY;  Service: Endoscopy;  Laterality: N/A;  . ESOPHAGOGASTRODUODENOSCOPY (EGD) WITH PROPOFOL N/A 05/07/2018   Procedure: ESOPHAGOGASTRODUODENOSCOPY (EGD) WITH PROPOFOL;  Surgeon: Lucilla Lame, MD;  Location: [facility] ENDOSCOPY;  Service: Endoscopy;  Laterality: N/A;  . history of colonoscopy]    . ILEOSCOPY N/A 07/22/2018   Procedure: ILEOSCOPY THROUGH STOMA;  Surgeon: Virgel Manifold, MD;  Location: [facility] ENDOSCOPY;  Service: Endoscopy;  Laterality: N/A;  . ILEOSTOMY    . ILEOSTOMY N/A 09/08/2018   Procedure: ILEOSTOMY REVISION POSSIBLE CREATION;  Surgeon: Herbert Pun, MD;  Location: [facility] ORS;  Service: General;  Laterality: N/A;  . ILEOSTOMY CLOSURE N/A 08/15/2018   Procedure: DILATION OF ILEOSTOMY STRICTURE;  Surgeon: Herbert Pun, MD;  Location: [facility] ORS;  Service: General;  Laterality: N/A;  . LAPAROTOMY Right 05/04/2018   Procedure: EXPLORATORY LAPAROTOMY right colectomy right and left ostomy;  Surgeon: Herbert Pun, MD;   Location: [facility] ORS;  Service: General;  Laterality: Right;  . LUNG BIOPSY    . MASTECTOMY Left    2000, [facility]  . ROTATOR CUFF REPAIR Right    [facility]   Family History  Problem Relation Age of Onset  . Breast cancer Mother 65  . Cancer Mother        Breast   . Cirrhosis Father   . Breast cancer Paternal Aunt 92  . Cancer Maternal Aunt        Breast    Social History   Tobacco Use  . Smoking status: Former Smoker    Packs/day: 0.50    Years: 20.00    Pack years: 10.00    Types: Cigarettes    Last attempt to quit: 07/02/2012    Years since quitting: 6.2  . Smokeless tobacco: Never Used  . Tobacco comment: quit 2014  Substance Use Topics  . Alcohol use: Yes    Comment: Occasionally   No Known Allergies    Review of Systems  Constitutional: Positive for appetite change and fatigue.  HENT: Positive for rhinorrhea. Negative for congestion and sore throat.   Eyes: Negative.   Respiratory: Positive for shortness of breath (with minimal exertion). Negative for chest tightness.   Cardiovascular: Positive for palpitations. Negative for chest pain and leg swelling.  Gastrointestinal: Negative for abdominal distention and abdominal pain.  Endocrine: Negative.   Genitourinary: Negative.   Musculoskeletal: Negative for back pain and neck pain.  Skin: Negative.   Allergic/Immunologic: Negative.   Neurological: Positive for headaches. Negative for dizziness and light-headedness.  Hematological: Negative for adenopathy. Does not bruise/bleed easily.  Psychiatric/Behavioral: Negative for dysphoric mood and sleep disturbance (sleeping on 2 pillows). The patient is not nervous/anxious.      Physical Exam  Constitutional: She is oriented to person, place, and time. She appears well-developed and well-nourished.  HENT:  Head: Normocephalic and atraumatic.  Neck: Normal range of motion. Neck supple. No JVD present.  Cardiovascular: Normal rate. An irregular rhythm present.   Pulmonary/Chest: Effort normal. No respiratory distress. She has no wheezes. She has no rales.  Abdominal: Soft. She exhibits no distension.  Musculoskeletal:       Right lower leg: She exhibits no tenderness and no edema.       Left lower leg: She exhibits no tenderness and no edema.  Neurological: She is alert and oriented to person, place, and time.  Skin: Skin is warm and dry.  Psychiatric: She has a normal mood and affect. Her behavior is normal.  Nursing note and vitals reviewed.  Assessment & Plan:  1: Chronic heart failure with reduced ejection fraction- - NYHA class III - euvolemic today - weighing daily and she was reminded to call for an overnight weight gain of >2 pounds or a weekly weight gain of >5 pounds - weight  - wearing oxygen at 2L as needed - BP will not allow initiation of entresto - BNP 09/04/18 was 182.0 - PharmD reconciled medications with the patient - due to frequent admissions/ ED visits, will refer to paramedic program  2: HTN- - BP  -  BMP 09/09/18 reviewed and showed sodium 137, potassium 3.9, creatinine 0.83 and GFR >60  3: Atrial fibrillation- - saw cardiology Lyndel Pleasure) 10/23/17 - changing beta-blocker to carvedilol 6.25mg  BID as it's on the $4.00 [employer] list; going to medication management clinic from here to see if she qualifies for assistance -   4: Lymphedema- - stage 2 - limited in her ability to exercise due to shortness of breath - edema resolved  Medication bottles were reviewed.

## 2018-09-22 ENCOUNTER — Telehealth: Payer: Self-pay | Admitting: Internal Medicine

## 2018-09-22 ENCOUNTER — Encounter: Payer: Self-pay | Admitting: Internal Medicine

## 2018-09-22 ENCOUNTER — Inpatient Hospital Stay: Payer: Medicaid Other | Attending: Internal Medicine

## 2018-09-22 ENCOUNTER — Inpatient Hospital Stay (HOSPITAL_BASED_OUTPATIENT_CLINIC_OR_DEPARTMENT_OTHER): Payer: Medicaid Other | Admitting: Internal Medicine

## 2018-09-22 ENCOUNTER — Ambulatory Visit: Payer: Self-pay | Admitting: Family

## 2018-09-22 VITALS — BP 112/75 | HR 85 | Temp 98.1°F | Resp 20 | Ht 67.0 in | Wt 123.0 lb

## 2018-09-22 DIAGNOSIS — R634 Abnormal weight loss: Secondary | ICD-10-CM

## 2018-09-22 DIAGNOSIS — I491 Atrial premature depolarization: Secondary | ICD-10-CM

## 2018-09-22 DIAGNOSIS — Z9012 Acquired absence of left breast and nipple: Secondary | ICD-10-CM | POA: Diagnosis not present

## 2018-09-22 DIAGNOSIS — Z933 Colostomy status: Secondary | ICD-10-CM | POA: Diagnosis not present

## 2018-09-22 DIAGNOSIS — M129 Arthropathy, unspecified: Secondary | ICD-10-CM | POA: Diagnosis not present

## 2018-09-22 DIAGNOSIS — E119 Type 2 diabetes mellitus without complications: Secondary | ICD-10-CM

## 2018-09-22 DIAGNOSIS — Z803 Family history of malignant neoplasm of breast: Secondary | ICD-10-CM

## 2018-09-22 DIAGNOSIS — I4891 Unspecified atrial fibrillation: Secondary | ICD-10-CM | POA: Diagnosis not present

## 2018-09-22 DIAGNOSIS — Z853 Personal history of malignant neoplasm of breast: Secondary | ICD-10-CM | POA: Insufficient documentation

## 2018-09-22 DIAGNOSIS — Z87891 Personal history of nicotine dependence: Secondary | ICD-10-CM | POA: Insufficient documentation

## 2018-09-22 DIAGNOSIS — Z9221 Personal history of antineoplastic chemotherapy: Secondary | ICD-10-CM

## 2018-09-22 DIAGNOSIS — I10 Essential (primary) hypertension: Secondary | ICD-10-CM

## 2018-09-22 DIAGNOSIS — Z79899 Other long term (current) drug therapy: Secondary | ICD-10-CM | POA: Insufficient documentation

## 2018-09-22 DIAGNOSIS — J449 Chronic obstructive pulmonary disease, unspecified: Secondary | ICD-10-CM | POA: Insufficient documentation

## 2018-09-22 DIAGNOSIS — F329 Major depressive disorder, single episode, unspecified: Secondary | ICD-10-CM

## 2018-09-22 DIAGNOSIS — C3411 Malignant neoplasm of upper lobe, right bronchus or lung: Secondary | ICD-10-CM

## 2018-09-22 DIAGNOSIS — Z85118 Personal history of other malignant neoplasm of bronchus and lung: Secondary | ICD-10-CM

## 2018-09-22 DIAGNOSIS — N179 Acute kidney failure, unspecified: Secondary | ICD-10-CM | POA: Diagnosis not present

## 2018-09-22 DIAGNOSIS — Z923 Personal history of irradiation: Secondary | ICD-10-CM | POA: Diagnosis not present

## 2018-09-22 DIAGNOSIS — D649 Anemia, unspecified: Secondary | ICD-10-CM

## 2018-09-22 DIAGNOSIS — R011 Cardiac murmur, unspecified: Secondary | ICD-10-CM | POA: Insufficient documentation

## 2018-09-22 DIAGNOSIS — I509 Heart failure, unspecified: Secondary | ICD-10-CM | POA: Diagnosis not present

## 2018-09-22 LAB — COMPREHENSIVE METABOLIC PANEL
ALT: 18 U/L (ref 0–44)
AST: 20 U/L (ref 15–41)
Albumin: 4.2 g/dL (ref 3.5–5.0)
Alkaline Phosphatase: 90 U/L (ref 38–126)
Anion gap: 7 (ref 5–15)
BUN: 40 mg/dL — AB (ref 8–23)
CO2: 15 mmol/L — ABNORMAL LOW (ref 22–32)
CREATININE: 1.6 mg/dL — AB (ref 0.44–1.00)
Calcium: 9.6 mg/dL (ref 8.9–10.3)
Chloride: 113 mmol/L — ABNORMAL HIGH (ref 98–111)
GFR, EST AFRICAN AMERICAN: 39 mL/min — AB (ref >60)
GFR, EST NON AFRICAN AMERICAN: 34 mL/min — AB (ref >60)
Glucose, Bld: 95 mg/dL (ref 70–99)
POTASSIUM: 4 mmol/L (ref 3.5–5.1)
Sodium: 135 mmol/L (ref 135–145)
TOTAL PROTEIN: 8.1 g/dL (ref 6.5–8.1)
Total Bilirubin: 0.6 mg/dL (ref 0.3–1.2)

## 2018-09-22 LAB — CBC WITH DIFFERENTIAL/PLATELET
Abs Immature Granulocytes: 0.02 10*3/uL (ref 0.00–0.07)
BASOS PCT: 1 %
Basophils Absolute: 0.1 10*3/uL (ref 0.0–0.1)
EOS ABS: 0.5 10*3/uL (ref 0.0–0.5)
Eosinophils Relative: 6 %
HCT: 29.6 % — ABNORMAL LOW (ref 36.0–46.0)
Hemoglobin: 9.2 g/dL — ABNORMAL LOW (ref 12.0–15.0)
IMMATURE GRANULOCYTES: 0 %
LYMPHS ABS: 2.8 10*3/uL (ref 0.7–4.0)
Lymphocytes Relative: 36 %
MCH: 29.6 pg (ref 26.0–34.0)
MCHC: 31.1 g/dL (ref 30.0–36.0)
MCV: 95.2 fL (ref 80.0–100.0)
MONO ABS: 0.6 10*3/uL (ref 0.1–1.0)
MONOS PCT: 8 %
Neutro Abs: 3.7 10*3/uL (ref 1.7–7.7)
Neutrophils Relative %: 49 %
Platelets: 407 10*3/uL — ABNORMAL HIGH (ref 150–400)
RBC: 3.11 MIL/uL — ABNORMAL LOW (ref 3.87–5.11)
RDW: 14.6 % (ref 11.5–15.5)
WBC: 7.7 10*3/uL (ref 4.0–10.5)
nRBC: 0 % (ref 0.0–0.2)

## 2018-09-22 NOTE — Telephone Encounter (Signed)
Discussed with Dr. Suzan Nailer the CT scan as ordered on December 12th.  Please schedule CT scan noncontrast chest in about 3 months prior to next visit.  Thank you

## 2018-09-22 NOTE — Progress Notes (Signed)
[facility] [facility] OFFICE PROGRESS NOTE  Patient Care Team: Elisabeth Cara, NP as PCP - General (Nurse Practitioner) [name], FNP as Nurse Practitioner (Family Medicine) [name], NP as Nurse Practitioner (Nurse Practitioner) [name], RN as Registered Nurse [name], RN as Registered Nurse  Cancer Staging Breast cancer in female [facility]) Staging form: Breast, AJCC 7th Edition - Clinical: Stage IIIA (T3, N1, M0) - Signed by [name], NP on 02/21/2016    Oncology History   # 2016- JAN RUL 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 A. LUNG MASS, RIGHT; CT-GUIDED BIOPSY: - ADENOCARCINOMA, ACINAR AND PAPILLARY MORPHOLOGIES. S/p RT [finished Dec 2017] s/p RT [DEC 2017]; AUG 2019- PET-right middle lobe radiation changes no evidence of recurrence.  # 2000-  Left breast cancer [T3N1- stage III] mastecomy s/p RT; s/p chemo; Tamoxifen  # NOV 111 2017- Mol testing [RML- adeno ca]  # 2019-July-aug 2019-ischemic colitis status post right hemicolectomy [Dr.[name]]; acute renal failure;  -----------------------------------------------------------------    DIAGNOSIS: RML Lung ca adeno [stage I]  STAGE:  I   ;GOALS: control  CURRENT/MOST RECENT THERAPY: surveillance         Breast cancer in female [facility])   07/24/1999 Initial Diagnosis    Breast cancer in female ([place]) T3 N1 M0 tumor ER/PR positive    08/24/1999 -  Anti-estrogen oral therapy    Started tamoxifen    08/24/1999 Surgery    Status post modified radical mastectomy of left breast     Chemotherapy        Radiation Therapy       11/24/2003 - 02/20/2009 Anti-estrogen oral therapy    Started Aromasin     Carcinoma of overlapping sites of left breast in female, estrogen receptor positive ([place])   06/22/2016 Initial Diagnosis    Cancer of overlapping sites  of left female breast ([place])     Malignant neoplasm of upper lobe of right lung ([place])      INTERVAL HISTORY:  Danielle [name] 62 y.o.  female pleasant patient above history of lung cancer stage I status post SBRT 2017; and also a remote history of breast cancer is here for follow-up.  The patient was followed up by surgery for her colostomy earlier this morning.  Patient noted to have leaking of the stool around the colostomy bag.  Is awaiting home care evaluation.  Patient denies any unusual shortness of breath or cough.  Positive for weight loss.  Review of Systems  Constitutional: Positive for malaise/fatigue and weight loss. Negative for chills, diaphoresis and fever.  HENT: Negative for nosebleeds and sore throat.   Eyes: Negative for double vision.  Respiratory: Positive for cough and shortness of breath. Negative for hemoptysis, sputum production and wheezing.   Cardiovascular: Negative for chest pain, palpitations, orthopnea and leg swelling.  Gastrointestinal: Negative for abdominal pain, blood in stool, constipation, diarrhea, heartburn, melena, nausea and vomiting.  Genitourinary: Negative for dysuria, frequency and urgency.  Musculoskeletal: Negative for back pain and joint pain.  Skin: Negative.  Negative for itching and rash.  Neurological: Negative for dizziness, tingling, focal weakness, weakness and headaches.  Endo/Heme/Allergies: Does not bruise/bleed easily.  Psychiatric/Behavioral: Negative for depression. The patient is not nervous/anxious and does not have insomnia.       PAST MEDICAL HISTORY :  Past Medical History:  Diagnosis Date  . Anxiety   . Arthritis   . Cancer ([place]) left    breast cancer 2000, chemo tx's with total mastectomy and lymph nodes resected.   . Cancer of right lung ([place]) 02/21/2016   rad tx's.   . CHF (congestive heart failure) ([place])   . COPD (chronic obstructive pulmonary disease) ([place])   . Dependence on supplemental oxygen   .  Depression   . Diabetes mellitus without complication ([place])   . Heart murmur   . Hypertension   . Lung nodule   . Lymphedema   . Shortness of breath dyspnea    with exertion  . Status post chemotherapy 2001   left breast cancer  . Status post radiation therapy 2001   left breast cancer    PAST SURGICAL HISTORY :   Past Surgical History:  Procedure Laterality Date  . Breast Biospy Left    [facility]  . BREAST SURGERY    . COLONOSCOPY N/A 04/30/2018   Procedure: COLONOSCOPY;  Surgeon: Virgel Manifold, MD;  Location: [facility] ENDOSCOPY;  Service: Endoscopy;  Laterality: N/A;  . COLONOSCOPY N/A 07/22/2018   Procedure: COLONOSCOPY;  Surgeon: Virgel Manifold, MD;  Location: [facility] ENDOSCOPY;  Service: Endoscopy;  Laterality: N/A;  . DILATION AND CURETTAGE OF UTERUS    . ELECTROMAGNETIC NAVIGATION BROCHOSCOPY Right 04/11/2016   Procedure: ELECTROMAGNETIC NAVIGATION BRONCHOSCOPY;  Surgeon: Vilinda Boehringer, MD;  Location: [facility] ORS;  Service: Cardiopulmonary;  Laterality: Right;  . ESOPHAGOGASTRODUODENOSCOPY N/A 07/22/2018   Procedure: ESOPHAGOGASTRODUODENOSCOPY (EGD);  Surgeon: Virgel Manifold, MD;  Location: [facility] ENDOSCOPY;  Service: Endoscopy;  Laterality: N/A;  . ESOPHAGOGASTRODUODENOSCOPY (EGD) WITH PROPOFOL N/A 05/07/2018   Procedure: ESOPHAGOGASTRODUODENOSCOPY (EGD) WITH PROPOFOL;  Surgeon: Lucilla Lame, MD;  Location: [facility] ENDOSCOPY;  Service: Endoscopy;  Laterality: N/A;  . history of colonoscopy]    . ILEOSCOPY N/A 07/22/2018   Procedure: ILEOSCOPY THROUGH STOMA;  Surgeon: Virgel Manifold, MD;  Location: [facility] ENDOSCOPY;  Service: Endoscopy;  Laterality: N/A;  . ILEOSTOMY    . ILEOSTOMY N/A 09/08/2018   Procedure: ILEOSTOMY REVISION POSSIBLE CREATION;  Surgeon: Herbert Pun, MD;  Location: [facility] ORS;  Service: General;  Laterality: N/A;  . ILEOSTOMY CLOSURE N/A 08/15/2018   Procedure: DILATION OF ILEOSTOMY STRICTURE;  Surgeon: Herbert Pun, MD;  Location: [facility]  ORS;  Service: General;  Laterality: N/A;  . LAPAROTOMY Right 05/04/2018   Procedure: EXPLORATORY LAPAROTOMY right colectomy right and left ostomy;  Surgeon: Herbert Pun, MD;  Location: [facility] ORS;  Service: General;  Laterality: Right;  . LUNG BIOPSY    . MASTECTOMY Left    2000, [facility]  . ROTATOR CUFF REPAIR Right    [facility]    FAMILY HISTORY :   Family History  Problem Relation Age of Onset  . Breast cancer Mother 27  . Cancer Mother        Breast   . Cirrhosis Father   . Breast cancer Paternal Aunt 73  . Cancer Maternal Aunt        Breast     SOCIAL HISTORY:   Social History   Tobacco Use  . Smoking status: Former Smoker    Packs/day: 0.50    Years: 20.00    Pack years: 10.00    Types: Cigarettes    Last attempt to quit: 07/02/2012    Years since quitting: 6.2  . Smokeless tobacco: Never Used  . Tobacco comment: quit 2014  Substance Use Topics  . Alcohol use: Yes    Comment: Occasionally  .  Drug use: No    ALLERGIES:  has No Known Allergies.  MEDICATIONS:  Current Outpatient Medications  Medication Sig Dispense Refill  . albuterol (PROVENTIL HFA;VENTOLIN HFA) 108 (90 Base) MCG/ACT inhaler Inhale 2 puffs into the lungs every 6 (six) hours as needed for wheezing or shortness of breath. 1 Inhaler 2  . apixaban (ELIQUIS) 5 MG TABS tablet Take 1 tablet (5 mg total) by mouth 2 (two) times daily. 60 tablet 0  . budesonide-formoterol (SYMBICORT) 160-4.5 MCG/ACT inhaler Inhale 2 puffs into the lungs 2 (two) times daily. 1 Inhaler 12  . carvedilol (COREG) 6.25 MG tablet Take 1 tablet (6.25 mg total) by mouth 2 (two) times daily. 60 tablet 3  . citalopram (CELEXA) 40 MG tablet Take 40 mg by mouth daily.    . ferrous sulfate 325 (65 FE) MG tablet Take 1 tablet (325 mg total) by mouth 2 (two) times daily with a meal. 60 tablet 3  . furosemide (LASIX) 20 MG tablet Take 20 mg by mouth daily.     [name] HYDROcodone-acetaminophen (NORCO/VICODIN) 5-325 MG tablet Take 1 tablet by  mouth every 6 (six) hours as needed for moderate pain or severe pain.    . megestrol (MEGACE) 400 MG/10ML suspension Take 7.5 mLs (300 mg total) by mouth 2 (two) times daily. 240 mL 0  . Multiple Vitamin (MULTIVITAMIN WITH MINERALS) TABS tablet Take 1 tablet by mouth daily. 30 tablet 1  . pantoprazole (PROTONIX) 40 MG tablet Take 1 tablet (40 mg total) by mouth 2 (two) times daily before a meal. 60 tablet 0  . Potassium (POTASSIMIN PO) Take 1 tablet by mouth daily as needed.    . traMADol (ULTRAM) 50 MG tablet Take 50 mg by mouth every 6 (six) hours as needed. for pain  0   No current facility-administered medications for this visit.     PHYSICAL EXAMINATION: ECOG PERFORMANCE STATUS: 1 - Symptomatic but completely ambulatory  BP 112/75 (BP Location: Right Arm, Patient Position: Sitting)   Pulse 85   Temp 98.1 F (36.7 C) (Oral)   Resp 20   Ht '5\' 7"'$  (1.702 m)   Wt 123 lb (55.8 kg)   BMI 19.26 kg/m   Filed Weights   09/22/18 1105  Weight: 123 lb (55.8 kg)    Physical Exam  Constitutional: She is oriented to person, place, and time.  Thin built moderately nourished female patient in a wheelchair.  Accompanied by daughter.  HENT:  Head: Normocephalic and atraumatic.  Mouth/Throat: Oropharynx is clear and moist. No oropharyngeal exudate.  Eyes: Pupils are equal, round, and reactive to light.  Neck: Normal range of motion. Neck supple.  Cardiovascular: Normal rate and regular rhythm.  Pulmonary/Chest: No respiratory distress. She has no wheezes.  Decreased breath sounds bilaterally.  No wheeze or crackles.  Abdominal: Soft. Bowel sounds are normal. She exhibits no distension and no mass. There is no tenderness. There is no rebound and no guarding.  Musculoskeletal: Normal range of motion. She exhibits no edema or tenderness.  Neurological: She is alert and oriented to person, place, and time.  Skin: Skin is warm.  Psychiatric: Affect normal.       LABORATORY DATA:  I have  reviewed the data as listed    Component Value Date/Time   NA 135 09/22/2018 1019   NA 132 (L) 02/09/2015 1100   K 4.0 09/22/2018 1019   K 3.8 02/09/2015 1100   CL 113 (H) 09/22/2018 1019   CL 95 (L) 02/09/2015 1100  CO2 15 (L) 09/22/2018 1019   CO2 29 02/09/2015 1100   GLUCOSE 95 09/22/2018 1019   GLUCOSE 105 (H) 02/09/2015 1100   BUN 40 (H) 09/22/2018 1019   BUN 16 02/09/2015 1100   CREATININE 1.60 (H) 09/22/2018 1019   CREATININE 0.81 02/09/2015 1100   CALCIUM 9.6 09/22/2018 1019   CALCIUM 9.1 02/09/2015 1100   PROT 8.1 09/22/2018 1019   PROT 7.7 02/09/2015 1100   ALBUMIN 4.2 09/22/2018 1019   ALBUMIN 4.3 02/09/2015 1100   AST 20 09/22/2018 1019   AST 29 02/09/2015 1100   ALT 18 09/22/2018 1019   ALT 20 02/09/2015 1100   ALKPHOS 90 09/22/2018 1019   ALKPHOS 69 02/09/2015 1100   BILITOT 0.6 09/22/2018 1019   BILITOT 0.9 02/09/2015 1100   GFRNONAA 34 (L) 09/22/2018 1019   GFRNONAA >60 02/09/2015 1100   GFRAA 39 (L) 09/22/2018 1019   GFRAA >60 02/09/2015 1100    No results found for: SPEP, UPEP  Lab Results  Component Value Date   WBC 7.7 09/22/2018   NEUTROABS 3.7 09/22/2018   HGB 9.2 (L) 09/22/2018   HCT 29.6 (L) 09/22/2018   MCV 95.2 09/22/2018   PLT 407 (H) 09/22/2018      Chemistry      Component Value Date/Time   NA 135 09/22/2018 1019   NA 132 (L) 02/09/2015 1100   K 4.0 09/22/2018 1019   K 3.8 02/09/2015 1100   CL 113 (H) 09/22/2018 1019   CL 95 (L) 02/09/2015 1100   CO2 15 (L) 09/22/2018 1019   CO2 29 02/09/2015 1100   BUN 40 (H) 09/22/2018 1019   BUN 16 02/09/2015 1100   CREATININE 1.60 (H) 09/22/2018 1019   CREATININE 0.81 02/09/2015 1100      Component Value Date/Time   CALCIUM 9.6 09/22/2018 1019   CALCIUM 9.1 02/09/2015 1100   ALKPHOS 90 09/22/2018 1019   ALKPHOS 69 02/09/2015 1100   AST 20 09/22/2018 1019   AST 29 02/09/2015 1100   ALT 18 09/22/2018 1019   ALT 20 02/09/2015 1100   BILITOT 0.6 09/22/2018 1019   BILITOT 0.9  02/09/2015 1100       RADIOGRAPHIC STUDIES: I have personally reviewed the radiological images as listed and agreed with the findings in the report. No results found.   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  #Plan to repeat a scan again in 3 months; discussed with Dr. Langley Gauss will discontinue CT scan as ordered radiation oncology on December 12.  # History of ER/PR positive HER-2/neu negative breast cancer stage III [2000]- clinically no evidence of recurrence.  Stable  # Anemia- hb 9-10 /stable; NOT iron deficient; monitor for now.  Stable  # A.fib on eliquis; stable as per cardiology  #Recent acute renal failure-high output from ostomy bag.  Creatinine today is 1.6.  Baseline around 1.  Recommend taking Lasix 20 mg every other day.  # DISPOSITION: # follow up in 3 months-MD/labs-cbc cmp;CT scan-noncontrast chest CT prior-Dr.B  Cc; Elmyra Ricks spencer/PCP   Orders Placed This Encounter  Procedures  . CT CHEST WO CONTRAST    Standing Status:   Future    Standing Expiration Date:   09/23/2019    Order Specific Question:   Preferred imaging location?    Answer:   [facility]  Order Specific Question:   Radiology Contrast Protocol - do NOT remove file path    Answer:   [email or web address]    Order Specific Question:   ** REASON FOR EXAM (FREE TEXT)    Answer:   Lung cancer   All questions were answered. The patient knows to call the clinic with any problems, questions or concerns.      [name], MD 09/22/2018 1:09 PM

## 2018-09-22 NOTE — Assessment & Plan Note (Addendum)
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  #Plan to repeat a scan again in 3 months; discussed with Dr. Langley Gauss will discontinue CT scan as ordered radiation oncology on December 12.  # History of ER/PR positive HER-2/neu negative breast cancer stage III [2000]- clinically no evidence of recurrence.  Stable  # Anemia- hb 9-10 /stable; NOT iron deficient; monitor for now.  Stable  # A.fib on eliquis; stable as per cardiology  #Recent acute renal failure-high output from ostomy bag.  Creatinine today is 1.6.  Baseline around 1.  Recommend taking Lasix 20 mg every other day.  # DISPOSITION: # follow up in 3 months-MD/labs-cbc cmp;CT scan-noncontrast chest CT prior-Dr.B  Cc; Elmyra Ricks spencer/PCP

## 2018-09-23 ENCOUNTER — Ambulatory Visit: Payer: Self-pay | Admitting: Family

## 2018-09-25 ENCOUNTER — Ambulatory Visit: Payer: Medicaid Other

## 2018-09-26 ENCOUNTER — Ambulatory Visit: Payer: Medicaid Other | Admitting: Radiation Oncology

## 2018-09-28 ENCOUNTER — Other Ambulatory Visit: Payer: Self-pay

## 2018-09-28 ENCOUNTER — Inpatient Hospital Stay
Admission: AD | Admit: 2018-09-28 | Discharge: 2018-10-04 | DRG: 395 | Disposition: A | Discharge status: Home Health | Payer: Medicaid Other | Source: Ambulatory Visit | Attending: General Surgery | Admitting: General Surgery

## 2018-09-28 DIAGNOSIS — E119 Type 2 diabetes mellitus without complications: Secondary | ICD-10-CM | POA: Diagnosis present

## 2018-09-28 DIAGNOSIS — Z79899 Other long term (current) drug therapy: Secondary | ICD-10-CM

## 2018-09-28 DIAGNOSIS — Z79891 Long term (current) use of opiate analgesic: Secondary | ICD-10-CM

## 2018-09-28 DIAGNOSIS — F329 Major depressive disorder, single episode, unspecified: Secondary | ICD-10-CM | POA: Diagnosis present

## 2018-09-28 DIAGNOSIS — Y838 Other surgical procedures as the cause of abnormal reaction of the patient, or of later complication, without mention of misadventure at the time of the procedure: Secondary | ICD-10-CM | POA: Diagnosis present

## 2018-09-28 DIAGNOSIS — Z923 Personal history of irradiation: Secondary | ICD-10-CM

## 2018-09-28 DIAGNOSIS — Z853 Personal history of malignant neoplasm of breast: Secondary | ICD-10-CM

## 2018-09-28 DIAGNOSIS — Z85118 Personal history of other malignant neoplasm of bronchus and lung: Secondary | ICD-10-CM

## 2018-09-28 DIAGNOSIS — F419 Anxiety disorder, unspecified: Secondary | ICD-10-CM | POA: Diagnosis present

## 2018-09-28 DIAGNOSIS — Z7901 Long term (current) use of anticoagulants: Secondary | ICD-10-CM

## 2018-09-28 DIAGNOSIS — Z87891 Personal history of nicotine dependence: Secondary | ICD-10-CM

## 2018-09-28 DIAGNOSIS — T2122XA Burn of second degree of abdominal wall, initial encounter: Secondary | ICD-10-CM | POA: Diagnosis present

## 2018-09-28 DIAGNOSIS — K9413 Enterostomy malfunction: Principal | ICD-10-CM | POA: Diagnosis present

## 2018-09-28 DIAGNOSIS — Z803 Family history of malignant neoplasm of breast: Secondary | ICD-10-CM

## 2018-09-28 DIAGNOSIS — Z9221 Personal history of antineoplastic chemotherapy: Secondary | ICD-10-CM

## 2018-09-28 DIAGNOSIS — J449 Chronic obstructive pulmonary disease, unspecified: Secondary | ICD-10-CM | POA: Diagnosis present

## 2018-09-28 DIAGNOSIS — Z9012 Acquired absence of left breast and nipple: Secondary | ICD-10-CM

## 2018-09-28 DIAGNOSIS — Z7951 Long term (current) use of inhaled steroids: Secondary | ICD-10-CM

## 2018-09-28 DIAGNOSIS — R911 Solitary pulmonary nodule: Secondary | ICD-10-CM | POA: Diagnosis present

## 2018-09-28 MED ORDER — PANTOPRAZOLE SODIUM 40 MG PO TBEC
40.0000 mg | DELAYED_RELEASE_TABLET | Freq: Two times a day (BID) | ORAL | Status: DC
  Administered 2018-09-29 – 2018-10-04 (×11): 40 mg via ORAL
  Filled 2018-09-28 (×11): qty 1

## 2018-09-28 MED ORDER — CARVEDILOL 6.25 MG PO TABS
6.2500 mg | ORAL_TABLET | Freq: Two times a day (BID) | ORAL | Status: DC
  Administered 2018-09-28 – 2018-10-04 (×10): 6.25 mg via ORAL
  Filled 2018-09-28 (×12): qty 1

## 2018-09-28 MED ORDER — CITALOPRAM HYDROBROMIDE 20 MG PO TABS
40.0000 mg | ORAL_TABLET | Freq: Every day | ORAL | Status: DC
  Administered 2018-09-29 – 2018-10-04 (×6): 40 mg via ORAL
  Filled 2018-09-28 (×6): qty 2

## 2018-09-28 MED ORDER — LOPERAMIDE HCL 2 MG PO CAPS
2.0000 mg | ORAL_CAPSULE | Freq: Two times a day (BID) | ORAL | Status: DC
  Administered 2018-09-28 – 2018-09-29 (×2): 2 mg via ORAL
  Filled 2018-09-28 (×2): qty 1

## 2018-09-28 MED ORDER — ONDANSETRON HCL 4 MG/2ML IJ SOLN: 4.0000 mg | Freq: Four times a day (QID) | INTRAMUSCULAR | Status: DC | PRN

## 2018-09-28 MED ORDER — ALBUTEROL SULFATE HFA 108 (90 BASE) MCG/ACT IN AERS: 2.0000 | INHALATION_SPRAY | Freq: Four times a day (QID) | RESPIRATORY_TRACT | Status: DC | PRN

## 2018-09-28 MED ORDER — FERROUS SULFATE 325 (65 FE) MG PO TABS
325.0000 mg | ORAL_TABLET | Freq: Two times a day (BID) | ORAL | Status: DC
  Administered 2018-09-28 – 2018-10-04 (×12): 325 mg via ORAL
  Filled 2018-09-28 (×12): qty 1

## 2018-09-28 MED ORDER — HYDROCODONE-ACETAMINOPHEN 5-325 MG PO TABS
1.0000 | ORAL_TABLET | Freq: Four times a day (QID) | ORAL | Status: DC | PRN
  Administered 2018-09-29 – 2018-10-03 (×7): 1 via ORAL
  Filled 2018-09-28 (×8): qty 1

## 2018-09-28 MED ORDER — ALBUTEROL SULFATE (2.5 MG/3ML) 0.083% IN NEBU: 2.5000 mg | INHALATION_SOLUTION | Freq: Four times a day (QID) | RESPIRATORY_TRACT | Status: DC | PRN

## 2018-09-28 MED ORDER — ADULT MULTIVITAMIN W/MINERALS CH
1.0000 | ORAL_TABLET | Freq: Every day | ORAL | Status: DC
  Administered 2018-09-29 – 2018-10-04 (×6): 1 via ORAL
  Filled 2018-09-28 (×6): qty 1

## 2018-09-28 MED ORDER — FUROSEMIDE 20 MG PO TABS
20.0000 mg | ORAL_TABLET | Freq: Every day | ORAL | Status: DC
  Administered 2018-09-29 – 2018-10-04 (×6): 20 mg via ORAL
  Filled 2018-09-28 (×6): qty 1

## 2018-09-28 MED ORDER — MORPHINE SULFATE (PF) 2 MG/ML IV SOLN
INTRAVENOUS | Status: AC
  Administered 2018-09-28: 2 mg via INTRAVENOUS
  Filled 2018-09-28: qty 1

## 2018-09-28 MED ORDER — APIXABAN 5 MG PO TABS
5.0000 mg | ORAL_TABLET | Freq: Two times a day (BID) | ORAL | Status: DC
  Administered 2018-09-28 – 2018-10-01 (×6): 5 mg via ORAL
  Filled 2018-09-28 (×6): qty 1

## 2018-09-28 MED ORDER — MOMETASONE FURO-FORMOTEROL FUM 200-5 MCG/ACT IN AERO
2.0000 | INHALATION_SPRAY | Freq: Two times a day (BID) | RESPIRATORY_TRACT | Status: DC
  Administered 2018-09-28 – 2018-10-04 (×10): 2 via RESPIRATORY_TRACT
  Filled 2018-09-28: qty 8.8

## 2018-09-28 MED ORDER — ZINC OXIDE 40 % EX OINT
TOPICAL_OINTMENT | Freq: Every day | CUTANEOUS | Status: DC
  Administered 2018-09-29 – 2018-10-04 (×2): via TOPICAL
  Filled 2018-09-28: qty 113

## 2018-09-28 MED ORDER — ONDANSETRON 4 MG PO TBDP: 4.0000 mg | ORAL_TABLET | Freq: Four times a day (QID) | ORAL | Status: DC | PRN

## 2018-09-28 MED ORDER — MEGESTROL ACETATE 400 MG/10ML PO SUSP
300.0000 mg | Freq: Two times a day (BID) | ORAL | Status: DC
  Administered 2018-09-28 – 2018-10-04 (×12): 300 mg via ORAL
  Filled 2018-09-28 (×14): qty 10

## 2018-09-28 MED ORDER — MORPHINE SULFATE (PF) 2 MG/ML IV SOLN
2.0000 mg | INTRAVENOUS | Status: DC | PRN
  Administered 2018-09-28 – 2018-10-04 (×8): 2 mg via INTRAVENOUS
  Filled 2018-09-28 (×7): qty 1

## 2018-09-28 NOTE — H&P (Signed)
SURGICAL HISTORY AND PHYSICAL NOTE   HISTORY OF PRESENT ILLNESS (HPI):  62 y.o. female with recent ileostomy revision. She has been having issue with ostomy site. It has been very difficult to find an appliance that stick and does not leaks. She has not been able to receive home care at home for unknown reason after multiple attempts from my office to try to find a service that accept her case.  She has significant skin irritation with pain on the area. She cannot control the pain at home and the skin is getting worse and worse.   PAST MEDICAL HISTORY (PMH):  Past Medical History:  Diagnosis Date  . Anxiety   . Arthritis   . Cancer ([place]) left    breast cancer 2000, chemo tx's with total mastectomy and lymph nodes resected.   . Cancer of right lung ([place]) 02/21/2016   rad tx's.   . CHF (congestive heart failure) ([place])   . COPD (chronic obstructive pulmonary disease) ([place])   . Dependence on supplemental oxygen   . Depression   . Diabetes mellitus without complication ([place])   . Heart murmur   . Hypertension   . Lung nodule   . Lymphedema   . Shortness of breath dyspnea    with exertion  . Status post chemotherapy 2001   left breast cancer  . Status post radiation therapy 2001   left breast cancer     PAST SURGICAL HISTORY ([place]):  Past Surgical History:  Procedure Laterality Date  . Breast Biospy Left    [facility]  . BREAST SURGERY    . COLONOSCOPY N/A 04/30/2018   Procedure: COLONOSCOPY;  Surgeon: Virgel Manifold, MD;  Location: [facility] ENDOSCOPY;  Service: Endoscopy;  Laterality: N/A;  . COLONOSCOPY N/A 07/22/2018   Procedure: COLONOSCOPY;  Surgeon: Virgel Manifold, MD;  Location: [facility] ENDOSCOPY;  Service: Endoscopy;  Laterality: N/A;  . DILATION AND CURETTAGE OF UTERUS    . ELECTROMAGNETIC NAVIGATION BROCHOSCOPY Right 04/11/2016   Procedure: ELECTROMAGNETIC NAVIGATION BRONCHOSCOPY;  Surgeon: Vilinda Boehringer, MD;  Location: [facility] ORS;  Service: Cardiopulmonary;  Laterality:  Right;  . ESOPHAGOGASTRODUODENOSCOPY N/A 07/22/2018   Procedure: ESOPHAGOGASTRODUODENOSCOPY (EGD);  Surgeon: Virgel Manifold, MD;  Location: [facility] ENDOSCOPY;  Service: Endoscopy;  Laterality: N/A;  . ESOPHAGOGASTRODUODENOSCOPY (EGD) WITH PROPOFOL N/A 05/07/2018   Procedure: ESOPHAGOGASTRODUODENOSCOPY (EGD) WITH PROPOFOL;  Surgeon: Lucilla Lame, MD;  Location: [facility] ENDOSCOPY;  Service: Endoscopy;  Laterality: N/A;  . history of colonoscopy]    . ILEOSCOPY N/A 07/22/2018   Procedure: ILEOSCOPY THROUGH STOMA;  Surgeon: Virgel Manifold, MD;  Location: [facility] ENDOSCOPY;  Service: Endoscopy;  Laterality: N/A;  . ILEOSTOMY    . ILEOSTOMY N/A 09/08/2018   Procedure: ILEOSTOMY REVISION POSSIBLE CREATION;  Surgeon: Herbert Pun, MD;  Location: [facility] ORS;  Service: General;  Laterality: N/A;  . ILEOSTOMY CLOSURE N/A 08/15/2018   Procedure: DILATION OF ILEOSTOMY STRICTURE;  Surgeon: Herbert Pun, MD;  Location: [facility] ORS;  Service: General;  Laterality: N/A;  . LAPAROTOMY Right 05/04/2018   Procedure: EXPLORATORY LAPAROTOMY right colectomy right and left ostomy;  Surgeon: Herbert Pun, MD;  Location: [facility] ORS;  Service: General;  Laterality: Right;  . LUNG BIOPSY    . MASTECTOMY Left    2000, [facility]  . ROTATOR CUFF REPAIR Right    [facility]     MEDICATIONS:  Prior to Admission medications   Medication Sig Start Date End Date Taking? Authorizing Provider  apixaban (ELIQUIS) 5 MG TABS tablet Take 1 tablet (5 mg  total) by mouth 2 (two) times daily. 06/23/18  Yes [name], MD  carvedilol (COREG) 6.25 MG tablet Take 1 tablet (6.25 mg total) by mouth 2 (two) times daily. 06/24/18  Yes Hackney, Tina A, FNP  citalopram (CELEXA) 40 MG tablet Take 40 mg by mouth daily.   Yes [provider]  ferrous sulfate 325 (65 FE) MG tablet Take 1 tablet (325 mg total) by mouth 2 (two) times daily with a meal. 07/02/18  Yes [name], MD  furosemide (LASIX) 20 MG tablet  Take 20 mg by mouth daily.    Yes [provider]  HYDROcodone-acetaminophen (NORCO/VICODIN) 5-325 MG tablet Take 1 tablet by mouth every 6 (six) hours as needed for moderate pain or severe pain.   Yes [provider]  pantoprazole (PROTONIX) 40 MG tablet Take 1 tablet (40 mg total) by mouth 2 (two) times daily before a meal. 07/23/18  Yes Sudini, Srikar, MD  traMADol (ULTRAM) 50 MG tablet Take 50 mg by mouth every 6 (six) hours as needed. for pain 07/28/18  Yes [provider]  albuterol (PROVENTIL HFA;VENTOLIN HFA) 108 (90 Base) MCG/ACT inhaler Inhale 2 puffs into the lungs every 6 (six) hours as needed for wheezing or shortness of breath. 04/24/18   Demetrios Loll, MD  budesonide-formoterol [facility]) 160-4.5 MCG/ACT inhaler Inhale 2 puffs into the lungs 2 (two) times daily. 04/24/18   Demetrios Loll, MD  megestrol (MEGACE) 400 MG/10ML suspension Take 7.5 mLs (300 mg total) by mouth 2 (two) times daily. 06/05/18   Fritzi Mandes, MD  Multiple Vitamin (MULTIVITAMIN WITH MINERALS) TABS tablet Take 1 tablet by mouth daily. 06/06/18   Fritzi Mandes, MD  Potassium (POTASSIMIN PO) Take 1 tablet by mouth daily as needed.    [provider]     ALLERGIES:  No Known Allergies   SOCIAL HISTORY:  Social History   Socioeconomic History  . Marital status: Divorced    Spouse name: Not on file  . Number of children: Not on file  . Years of education: Not on file  . Highest education level: Not on file  Occupational History  . Not on file  Social Needs  . Financial resource strain: Hard  . Food insecurity:    Worry: Often true    Inability: Often true  . Transportation needs:    Medical: No    Non-medical: No  Tobacco Use  . Smoking status: Former Smoker    Packs/day: 0.50    Years: 20.00    Pack years: 10.00    Types: Cigarettes    Last attempt to quit: 07/02/2012    Years since quitting: 6.2  . Smokeless tobacco: Never Used  . Tobacco comment: quit 2014  Substance  and Sexual Activity  . Alcohol use: Yes    Comment: Occasionally  . Drug use: No  . Sexual activity: Not on file  Lifestyle  . Physical activity:    Days per week: 2 days    Minutes per session: 30 min  . Stress: Not at all  Relationships  . Social connections:    Talks on phone: More than three times a week    Gets together: More than three times a week    Attends religious service: 1 to 4 times per year    Active member of club or organization: No    Attends meetings of clubs or organizations: Never    Relationship status: Divorced  . Intimate partner violence:    Fear of current  or ex partner: No    Emotionally abused: No    Physically abused: No    Forced sexual activity: No  Other Topics Concern  . Not on file  Social History Narrative  . Not on file    The patient currently resides (home / rehab facility / nursing home): Home The patient normally is (ambulatory / bedbound): Ambulatory   FAMILY HISTORY:  Family History  Problem Relation Age of Onset  . Breast cancer Mother 49  . Cancer Mother        Breast   . Cirrhosis Father   . Breast cancer Paternal Aunt 21  . Cancer Maternal Aunt        Breast      REVIEW OF SYSTEMS:  Constitutional: denies weight loss, fever, chills, or sweats  Eyes: denies any other vision changes, history of eye injury  ENT: denies sore throat, hearing problems  Respiratory: denies shortness of breath, wheezing  Cardiovascular: denies chest pain, palpitations  Gastrointestinal: denies abdominal pain, N/V, or diarrhea/and bowel function as per HPI Genitourinary: denies burning with urination or urinary frequency Musculoskeletal: denies any other joint pains or cramps  Skin: positive for skin irritation on the abdomen  Neurological: denies any other headache, dizziness, weakness  Psychiatric: denies any other depression, anxiety   All other review of systems were negative   VITAL SIGNS:  Temp:  [97.7 F (36.5 C)-98.4 F (36.9  C)] 98.4 F (36.9 C) (12/15 1340) Pulse Rate:  [86-91] 86 (12/15 1340) Resp:  [14-20] 14 (12/15 1340) BP: (108-123)/(73-80) 123/73 (12/15 1340) SpO2:  [100 %] 100 % (12/15 1340) Weight:  [57.5 kg] 57.5 kg (12/15 1000)     Height: 5\' 7"  (170.2 cm) Weight: 57.5 kg BMI (Calculated): 19.85   INTAKE/OUTPUT:  This shift: No intake/output data recorded.  Last 2 shifts: @IOLAST2SHIFTS @   PHYSICAL EXAM:  Constitutional:  -- Normal body habitus  -- Awake, alert, and oriented x3  Eyes:  -- Pupils equally round and reactive to light  -- No scleral icterus  Ear, nose, and throat:  -- No jugular venous distension  Pulmonary:  -- No crackles  -- Equal breath sounds bilaterally -- Breathing non-labored at rest Cardiovascular:  -- S1, S2 present  -- No pericardial rubs Gastrointestinal:  -- right lower quadrant ileostomy with peri skin irritation (second degree burn) from stool leakage.  Musculoskeletal and Integumentary:  -- Wounds or skin discoloration: None appreciated -- Extremities: B/L UE and LE FROM, hands and feet warm, no edema  Neurologic:  -- Motor function: intact and symmetric -- Sensation: intact and symmetric   Labs:  CBC Latest Ref Rng & Units 09/22/2018 09/09/2018 09/04/2018  WBC 4.0 - 10.5 K/uL 7.7 9.9 8.4  Hemoglobin 12.0 - 15.0 g/dL 9.2(L) 8.5(L) 9.8(L)  Hematocrit 36.0 - 46.0 % 29.6(L) 26.3(L) 30.6(L)  Platelets 150 - 400 K/uL 407(H) 279 352   CMP Latest Ref Rng & Units 09/22/2018 09/09/2018 09/04/2018  Glucose 70 - 99 mg/dL 95 93 94  BUN 8 - 23 mg/dL 40(H) 13 19  Creatinine 0.44 - 1.00 mg/dL 1.60(H) 0.83 1.21(H)  Sodium 135 - 145 mmol/L 135 137 136  Potassium 3.5 - 5.1 mmol/L 4.0 3.9 3.9  Chloride 98 - 111 mmol/L 113(H) 112(H) 109  CO2 22 - 32 mmol/L 15(L) 21(L) 17(L)  Calcium 8.9 - 10.3 mg/dL 9.6 8.9 9.8  Total Protein 6.5 - 8.1 g/dL 8.1 6.4(L) 7.9  Total Bilirubin 0.3 - 1.2 mg/dL 0.6 0.8 0.6  Alkaline Phos  38 - 126 U/L 90 50 72  AST 15 - 41 U/L 20 15 20    ALT 0 - 44 U/L 18 10 18      Imaging studies:  None contributory   Assessment/Plan:  62 y.o. female with ileostomy in place with appliance malfunction with persistent leakage and severe skin irritation causing second degree burn.  Will admit patient for ostomy nurse evaluation, and to find an appliance that fits her and decrease the leakage to let the skin heal. Will order Imodium to make stool pasty and Desitin cream to help with skin healing. Will perform frequent ostomy changes until it gets better.   Arnold Long, MD

## 2018-09-28 NOTE — Plan of Care (Signed)
09/28/2018 9:19 PM  Patient continues to have chronic stool leakage to the left of the ostomy wafer. Stoma location is to the right and near the umbilicus. The stoma is in line with a contour of the abdomen which prevents the wafer from adhering flush to the skin. Patient was admitted in the past for the same issue. Patient and daughter have stated that they have used many other types of wound ostomy products, but nothing seems to work. Patient is unable to live a productive lifestyle due to the continuous leaking of ostomy. The skin around the ostomy is showing signs of moisture associated damage. Patient states she does not want the zinc ointment near the wafer as emollients or creams will cause the wafer to not adhere to the skin. I changed the ostomy appliance three times within 8 hours which was very painful to the patient. Administered pain medicine to assist with comfort. Requesting wound-ostomy consult for additional assistance.   Fuller Mandril, RN

## 2018-09-29 DIAGNOSIS — Z9012 Acquired absence of left breast and nipple: Secondary | ICD-10-CM | POA: Diagnosis not present

## 2018-09-29 DIAGNOSIS — T2122XA Burn of second degree of abdominal wall, initial encounter: Secondary | ICD-10-CM | POA: Diagnosis present

## 2018-09-29 DIAGNOSIS — Z79899 Other long term (current) drug therapy: Secondary | ICD-10-CM | POA: Diagnosis not present

## 2018-09-29 DIAGNOSIS — Z87891 Personal history of nicotine dependence: Secondary | ICD-10-CM | POA: Diagnosis not present

## 2018-09-29 DIAGNOSIS — R911 Solitary pulmonary nodule: Secondary | ICD-10-CM | POA: Diagnosis present

## 2018-09-29 DIAGNOSIS — Z85118 Personal history of other malignant neoplasm of bronchus and lung: Secondary | ICD-10-CM | POA: Diagnosis not present

## 2018-09-29 DIAGNOSIS — E119 Type 2 diabetes mellitus without complications: Secondary | ICD-10-CM | POA: Diagnosis present

## 2018-09-29 DIAGNOSIS — Z7951 Long term (current) use of inhaled steroids: Secondary | ICD-10-CM | POA: Diagnosis not present

## 2018-09-29 DIAGNOSIS — F419 Anxiety disorder, unspecified: Secondary | ICD-10-CM | POA: Diagnosis present

## 2018-09-29 DIAGNOSIS — Z9221 Personal history of antineoplastic chemotherapy: Secondary | ICD-10-CM | POA: Diagnosis not present

## 2018-09-29 DIAGNOSIS — Z79891 Long term (current) use of opiate analgesic: Secondary | ICD-10-CM | POA: Diagnosis not present

## 2018-09-29 DIAGNOSIS — F329 Major depressive disorder, single episode, unspecified: Secondary | ICD-10-CM | POA: Diagnosis present

## 2018-09-29 DIAGNOSIS — Z923 Personal history of irradiation: Secondary | ICD-10-CM | POA: Diagnosis not present

## 2018-09-29 DIAGNOSIS — Z853 Personal history of malignant neoplasm of breast: Secondary | ICD-10-CM | POA: Diagnosis not present

## 2018-09-29 DIAGNOSIS — K9413 Enterostomy malfunction: Secondary | ICD-10-CM | POA: Diagnosis present

## 2018-09-29 DIAGNOSIS — Y838 Other surgical procedures as the cause of abnormal reaction of the patient, or of later complication, without mention of misadventure at the time of the procedure: Secondary | ICD-10-CM | POA: Diagnosis present

## 2018-09-29 DIAGNOSIS — Z803 Family history of malignant neoplasm of breast: Secondary | ICD-10-CM | POA: Diagnosis not present

## 2018-09-29 DIAGNOSIS — Z7901 Long term (current) use of anticoagulants: Secondary | ICD-10-CM | POA: Diagnosis not present

## 2018-09-29 DIAGNOSIS — J449 Chronic obstructive pulmonary disease, unspecified: Secondary | ICD-10-CM | POA: Diagnosis present

## 2018-09-29 MED ORDER — LOPERAMIDE HCL 2 MG PO CAPS
2.0000 mg | ORAL_CAPSULE | Freq: Three times a day (TID) | ORAL | Status: DC
  Administered 2018-09-29 – 2018-10-03 (×13): 2 mg via ORAL
  Filled 2018-09-29 (×13): qty 1

## 2018-09-29 NOTE — Consult Note (Addendum)
[facility] Nurse ostomy consult note Pt is familiar to the [facility] nursing team from multiple previous admissions for ostomy complications. Initial surgery was performed in July and pt had a stoma revision on 12/9, according to the EMR.  Stoma type/location: Ileostomy stoma to right abd.  Current pouch is leaking behind the barrier and nurses notes state that it has leaked all weekend.  Pt c/o constant itching and burning from skin maceration surrounding the stoma to 1 cm; red, moist and painful to touch.  It will be difficult to maintain a pouch seal until this problem is resolved.  Stoma is red and viable; slightly above skin level, 1 1/4 inches. Located next to a deep crease at 3:00 o'clock; also the os is pointing in this direction. Applied piece of barrier ring to attempt to fill in the crease before pouch was applied. Treatment options for stomal/peristomal skin: attempted to protect location with skin prep and powder, using crusting technique.  Pt is familiar with this process.  Applied barrier ring and convex pouch, and Tegaderm around the pouch edges.  Applied belt to assist with holding in place over the crease.  Output: Large amt green-brown liquid stool.  Ostomy pouching: 1pc.  Education provided: Demonstrated process to patient so she can help describe and assist the staff nurse PRN if leaking occurs.  3 sets of supplies left at the bedside and instructions provided for patient and staff nurse use.  No family at the bedside but patient is well-informed regarding pouching routines. Enrolled patient in [place] program: Yes, previously Recommend that the patient proceed with ostomy reversal surgery as soon as possible; she has experienced problems with her ostomy leaking and creating burning skin and a difficult pouching situation in a crease since July.  WOC will assess pouching situation daily. Julien Girt MSN, RN, [place], [place], [place]

## 2018-09-29 NOTE — Progress Notes (Signed)
[facility] Hospital Day(s): 0.   Post op day(s):  [name]   Interval History: Patient seen and examined, no acute events or new complaints overnight. Patient reports continue with bag leaking.  Vital signs in last 24 hours: [min-max] current  Temp:  [97.6 F (36.4 C)-98.7 F (37.1 C)] 98.2 F (36.8 C) (12/16 1127) Pulse Rate:  [79-89] 89 (12/16 1127) Resp:  [16-18] 16 (12/16 0651) BP: (99-122)/(69-80) 102/69 (12/16 1127) SpO2:  [99 %-100 %] 100 % (12/16 1127)     Height: 5\' 7"  (170.2 cm) Weight: 57.5 kg BMI (Calculated): 19.85    Physical Exam:  Constitutional: alert, cooperative and no distress  Respiratory: breathing non-labored at rest  Cardiovascular: regular rate and sinus rhythm  Gastrointestinal: soft, non-distended. Right lower quadrant ileostomy pink and patent with skin irritation.  Labs:  CBC Latest Ref Rng & Units 09/22/2018 09/09/2018 09/04/2018  WBC 4.0 - 10.5 K/uL 7.7 9.9 8.4  Hemoglobin 12.0 - 15.0 g/dL 9.2(L) 8.5(L) 9.8(L)  Hematocrit 36.0 - 46.0 % 29.6(L) 26.3(L) 30.6(L)  Platelets 150 - 400 K/uL 407(H) 279 352   CMP Latest Ref Rng & Units 09/22/2018 09/09/2018 09/04/2018  Glucose 70 - 99 mg/dL 95 93 94  BUN 8 - 23 mg/dL 40(H) 13 19  Creatinine 0.44 - 1.00 mg/dL 1.60(H) 0.83 1.21(H)  Sodium 135 - 145 mmol/L 135 137 136  Potassium 3.5 - 5.1 mmol/L 4.0 3.9 3.9  Chloride 98 - 111 mmol/L 113(H) 112(H) 109  CO2 22 - 32 mmol/L 15(L) 21(L) 17(L)  Calcium 8.9 - 10.3 mg/dL 9.6 8.9 9.8  Total Protein 6.5 - 8.1 g/dL 8.1 6.4(L) 7.9  Total Bilirubin 0.3 - 1.2 mg/dL 0.6 0.8 0.6  Alkaline Phos 38 - 126 U/L 90 50 72  AST 15 - 41 U/L 20 15 20   ALT 0 - 44 U/L 18 10 18     Imaging studies: No new pertinent imaging studies   Assessment/Plan:  62 y.o. female with ileostomy in place with appliance malfunction with persistent leakage and severe skin irritation causing second degree burn.   Patient with an initial surgery that she almost died after surgery  requiring prolonged intubation and ICU stay. Patient has congestive heart failure, atrial fibrillation on anticoagulation, Moderate COPD, among other medical conditions that makes her a poor candidate for surgery.   It has been discussed with patient and daughter multiple times about the alternative of ileostomy reversal. Due to the risk of surgery The patient and the daughter does not want to take that risk. The prefer to try to get a good quality of life with the ileostomy than having a complication from surgery or exacerbating her medical conditions and needing mechanical ventilation or any other complications. I understand the patient's and daughter wishes and I have discussed with them that I will do the best to give her the best quality of life possible. If surgery needs to be done, will be considered and discussed, but if the ileostomy area can be improve and patient can be comfortable, will try to do the best for her to be comfortable.   Will increase imodium frequency to make stool more pasty.   Arnold Long, MD

## 2018-09-30 NOTE — Consult Note (Addendum)
[facility] Nurse ostomy consult note Stoma type/location: Ileostomy stoma to right abd.  Current pouch is intact with good seal and was applied yesterday.  Output: Large amt brown thick liquid stool.  Ostomy pouching: 1pc convex, barrier ring, belt.  Education provided: Patient denies further questions at this time. She is independently emptying the pouch.  Discussed ordering supplies after discharge and reviewed pouch application steps and troubleshooting techniques.  5 sets of supplies left at the bedside and instructions provided for patient and staff nurse use and after discharge until more can be ordered by the patient.  No family at the bedside but patient is well-informed regarding pouching routines. Enrolled patient in [place] program: Yes, previously Pt could benefit from home health assistance after discharge. [place] team will assess pouching situation daily. Julien Girt MSN, RN, [employer], [facility]

## 2018-09-30 NOTE — Plan of Care (Signed)
No leakage from around ostomy bag today. Ostomy output is form and soft. The patient has been able to ambulate independently. Pain in abdomen has been managed with PRN pain medication.   Problem: Education: Goal: Knowledge of General Education information will improve Description Including pain rating scale, medication(s)/side effects and non-pharmacologic comfort measures Outcome: Progressing   Problem: Health Behavior/Discharge Planning: Goal: Ability to manage health-related needs will improve Outcome: Progressing   Problem: Clinical Measurements: Goal: Ability to maintain clinical measurements within normal limits will improve Outcome: Progressing Goal: Will remain free from infection Outcome: Progressing Goal: Diagnostic test results will improve Outcome: Progressing Goal: Respiratory complications will improve Outcome: Progressing Goal: Cardiovascular complication will be avoided Outcome: Progressing   Problem: Activity: Goal: Risk for activity intolerance will decrease Outcome: Progressing   Problem: Nutrition: Goal: Adequate nutrition will be maintained Outcome: Progressing   Problem: Coping: Goal: Level of anxiety will decrease Outcome: Progressing   Problem: Elimination: Goal: Will not experience complications related to bowel motility Outcome: Progressing Goal: Will not experience complications related to urinary retention Outcome: Progressing   Problem: Pain Managment: Goal: General experience of comfort will improve Outcome: Progressing   Problem: Safety: Goal: Ability to remain free from injury will improve Outcome: Progressing   Problem: Skin Integrity: Goal: Risk for impaired skin integrity will decrease Outcome: Progressing

## 2018-09-30 NOTE — Progress Notes (Signed)
[facility] Hospital Day(s): 1.   Post op day(s):  [name]   Interval History: Patient seen and examined, no acute events or new complaints overnight. Patient reports that the bag has not healed this morning. Refers still has significant pain on the skin around the ostomy.  Vital signs in last 24 hours: [min-max] current  Temp:  [98 F (36.7 C)-98.6 F (37 C)] 98 F (36.7 C) (12/17 0935) Pulse Rate:  [77-88] 88 (12/17 0935) Resp:  [16-18] 18 (12/17 0935) BP: (118-123)/(66-82) 118/74 (12/17 0935) SpO2:  [99 %-100 %] 100 % (12/17 0935)     Height: 5\' 7"  (170.2 cm) Weight: 57.5 kg BMI (Calculated): 19.85   Physical Exam:  Constitutional: alert, cooperative and no distress  Respiratory: breathing non-labored at rest  Gastrointestinal: soft, non-tender, and non-distended. Right lower quadrant ileostomy pink and patent with more pasty stool today. Skin covered with tegaderm.   Labs:  CBC Latest Ref Rng & Units 09/22/2018 09/09/2018 09/04/2018  WBC 4.0 - 10.5 K/uL 7.7 9.9 8.4  Hemoglobin 12.0 - 15.0 g/dL 9.2(L) 8.5(L) 9.8(L)  Hematocrit 36.0 - 46.0 % 29.6(L) 26.3(L) 30.6(L)  Platelets 150 - 400 K/uL 407(H) 279 352   CMP Latest Ref Rng & Units 09/22/2018 09/09/2018 09/04/2018  Glucose 70 - 99 mg/dL 95 93 94  BUN 8 - 23 mg/dL 40(H) 13 19  Creatinine 0.44 - 1.00 mg/dL 1.60(H) 0.83 1.21(H)  Sodium 135 - 145 mmol/L 135 137 136  Potassium 3.5 - 5.1 mmol/L 4.0 3.9 3.9  Chloride 98 - 111 mmol/L 113(H) 112(H) 109  CO2 22 - 32 mmol/L 15(L) 21(L) 17(L)  Calcium 8.9 - 10.3 mg/dL 9.6 8.9 9.8  Total Protein 6.5 - 8.1 g/dL 8.1 6.4(L) 7.9  Total Bilirubin 0.3 - 1.2 mg/dL 0.6 0.8 0.6  Alkaline Phos 38 - 126 U/L 90 50 72  AST 15 - 41 U/L 20 15 20   ALT 0 - 44 U/L 18 10 18     Imaging studies: No new pertinent imaging studies   Assessment/Plan:  62 y.o.femalewith ileostomy in place with appliance malfunction with persistent leakage and severe skin irritation causing second degree  burn.  Today the wound appliance placed by Wound care nurse is working properly without leak. Skin around looks clean. Still with significant pain on area. The stool is more pasty today that will help to control leak. Patient needs to continue inpatient under observation to get the proper assistance with local care. She does not has the support to take care of the ileostomy at this moment. If the current ostomy care work and the skin improves, will continue finding the best fit for her so she can continue doing the changes at home. Will deal with her quality of life. If she can achieve a good quality of life with ileostomy, will keep ileostomy. If not able to improve patient's quality of life, will discuss again about ileostomy reversal. Patient refers understood and agrees.   Arnold Long, MD

## 2018-10-01 MED ORDER — APIXABAN 2.5 MG PO TABS
2.5000 mg | ORAL_TABLET | Freq: Two times a day (BID) | ORAL | Status: DC
  Administered 2018-10-01 – 2018-10-03 (×5): 2.5 mg via ORAL
  Filled 2018-10-01 (×5): qty 1

## 2018-10-01 NOTE — Progress Notes (Signed)
[facility] Hospital Day(s): 2.   Post op day(s):  [name]   Interval History: Patient seen and examined, no acute events or new complaints overnight. Patient reports skin is less painful but more itchin, denies leak from bag.   Vital signs in last 24 hours: [min-max] current  Temp:  [97.2 F (36.2 C)-98.6 F (37 C)] 98.6 F (37 C) (12/18 1616) Pulse Rate:  [79-118] 91 (12/18 1616) Resp:  [16-20] 16 (12/18 1616) BP: (95-122)/(70-80) 106/80 (12/18 1616) SpO2:  [97 %-100 %] 100 % (12/18 1616)     Height: 5\' 7"  (170.2 cm) Weight: 57.5 kg BMI (Calculated): 19.85    Physical Exam:  Constitutional: alert, cooperative and no distress  Respiratory: breathing non-labored at rest  Cardiovascular: regular rate and sinus rhythm  Gastrointestinal: soft, non-tender, and non-distended. Ostomy pink and patent with pasty stool. Ostomy bag in place without leaking stool.   Labs:  CBC Latest Ref Rng & Units 09/22/2018 09/09/2018 09/04/2018  WBC 4.0 - 10.5 K/uL 7.7 9.9 8.4  Hemoglobin 12.0 - 15.0 g/dL 9.2(L) 8.5(L) 9.8(L)  Hematocrit 36.0 - 46.0 % 29.6(L) 26.3(L) 30.6(L)  Platelets 150 - 400 K/uL 407(H) 279 352   CMP Latest Ref Rng & Units 09/22/2018 09/09/2018 09/04/2018  Glucose 70 - 99 mg/dL 95 93 94  BUN 8 - 23 mg/dL 40(H) 13 19  Creatinine 0.44 - 1.00 mg/dL 1.60(H) 0.83 1.21(H)  Sodium 135 - 145 mmol/L 135 137 136  Potassium 3.5 - 5.1 mmol/L 4.0 3.9 3.9  Chloride 98 - 111 mmol/L 113(H) 112(H) 109  CO2 22 - 32 mmol/L 15(L) 21(L) 17(L)  Calcium 8.9 - 10.3 mg/dL 9.6 8.9 9.8  Total Protein 6.5 - 8.1 g/dL 8.1 6.4(L) 7.9  Total Bilirubin 0.3 - 1.2 mg/dL 0.6 0.8 0.6  Alkaline Phos 38 - 126 U/L 90 50 72  AST 15 - 41 U/L 20 15 20   ALT 0 - 44 U/L 18 10 18     Imaging studies: No new pertinent imaging studies  Assessment/Plan:  62 y.o.femalewith ileostomy in place with appliance malfunction with persistent leakage and severe skin irritation causing second degree burn. Patient  doing better. Skin pain and irritation improving. Wound care nurse doing a great job controlling the ostomy and patient is responding. Patient is very cooperative and wants to learn and be independent but she needs a home nurse service for ostomy assistance until she can control better and learn how to maintain the area without leaking. Will continue with local care and helping patient to improve the area and find the perfect fit that does not leak.   Arnold Long, MD

## 2018-10-01 NOTE — Progress Notes (Signed)
PHARMACY NOTE: RENAL DOSAGE ADJUSTMENT  Current regimen includes a mismatch between dosage and estimated renal function.  As per policy approved by the Pharmacy & Therapeutics and Medical Executive Committees, the  dosage will be adjusted accordingly.  Current dosage:  apixaban 5mg  BID  Indication: afib  Renal Function:  Estimated Creatinine Clearance: 32.2 mL/min (A) (by C-G formula based on SCr of 1.6 mg/dL (H)).    apixaban dosage has been changed to:  2.5mg  BID  Additional comments: 2.5 mg orally twice daily in patients with at least 2 of the following characteristics, age 64 years or older, body weight 60 kg or less, or serum creatinine 1.5 mg/dL (133 mcmol/L) or higher  Thank you for allowing pharmacy to be a part of this patient's care.  Dallie Piles, [facility] 10/01/2018 3:20 PM

## 2018-10-01 NOTE — Consult Note (Addendum)
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  Current pouch has been intact with good seal for 2 days; patient states it is beginning to itch behind the barrier, so removed to assess peristomal skin and changed pouch.  Stoma is red and viable; slightly above skin level, 1 1/4 inches. Located next to a deep crease at 3:00 o'clock; also the os is pointing in this direction. Applied piece of barrier ring to fill in the crease before pouch was applied. Previous skin maceration to peristomal edges has completely resolved and is intact without further maceration. Treatment options for stomal/peristomal skin: Protected location with skin prep and powder, using crusting technique.  Pt is familiar with this process.  Applied barrier ring and convex pouch, and Tegaderm around the pouch edges.  Applied belt to assist with holding in place over the crease.  Output: Mod amt soft thick brown stool.  Ostomy pouching: 1pc.  Education provided: Demonstrated process to patient so she can help describe and assist the staff nurse PRN if leaking occurs.  4 sets of supplies left at the bedside and instructions provided for patient and staff nurse use if patient discharges.  No family at the bedside but patient is well-informed regarding pouching routines and is independent with emptying without assistance.  Recemmoned home health assistance after discharge for pouching assistance. Enrolled patient in [place] program: Yes, previously WOC will assess pouching situation daily. Julien Girt MSN, RN, [place], [place], [place]

## 2018-10-01 NOTE — Plan of Care (Addendum)
Pain managed with PRN pain medication. No falls. The patient is able to ambulate independently. Ostomy output is more watery today than yesterday. Imodium given as schedule. WOCN saw the patient today and will see her again tomorrow for hands on teaching.  Problem: Education: Goal: Knowledge of General Education information will improve Description Including pain rating scale, medication(s)/side effects and non-pharmacologic comfort measures Outcome: Progressing   Problem: Health Behavior/Discharge Planning: Goal: Ability to manage health-related needs will improve Outcome: Progressing   Problem: Clinical Measurements: Goal: Ability to maintain clinical measurements within normal limits will improve Outcome: Progressing Goal: Will remain free from infection Outcome: Progressing Goal: Diagnostic test results will improve Outcome: Progressing Goal: Respiratory complications will improve Outcome: Progressing Goal: Cardiovascular complication will be avoided Outcome: Progressing   Problem: Activity: Goal: Risk for activity intolerance will decrease Outcome: Progressing   Problem: Nutrition: Goal: Adequate nutrition will be maintained Outcome: Progressing   Problem: Coping: Goal: Level of anxiety will decrease Outcome: Progressing   Problem: Elimination: Goal: Will not experience complications related to bowel motility Outcome: Progressing Goal: Will not experience complications related to urinary retention Outcome: Progressing   Problem: Pain Managment: Goal: General experience of comfort will improve Outcome: Progressing   Problem: Safety: Goal: Ability to remain free from injury will improve Outcome: Progressing   Problem: Skin Integrity: Goal: Risk for impaired skin integrity will decrease Outcome: Progressing

## 2018-10-02 NOTE — Consult Note (Addendum)
[place] Nurse ostomy follow-up consultnote Current pouch is intact with good seal, applied yesterday. 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  Stoma is red and viable; slightly above skin level, 1 1/4 inches.  Previous skin maceration to peristomal edges was completely resolved when skin was assessed yesterday. Output: Mod amt soft thick brown stool. Ostomy pouching: 1pc.  Education provided: Reviewed pouch application process with patient so she can helpdescribe andassist the staff nurse PRN if leaking occurs. Provided written instructions also for use after discharge. 4 sets of supplies left at the bedside and instructions provided for patient and staff nurse use. No family at the bedside but patient is well-informed regarding pouching routines and is independent with emptying without assistance. She verbalized understanding of the process and from a WOC standpoint, patient is ready to discharge home when the medical team agrees. Recommend home health assistance after discharge for pouching assistance. Enrolled patient in [place] program: Yes, previously WOC will assess pouching situation daily. Julien Girt MSN, RN, [place], [place], [place]

## 2018-10-02 NOTE — Progress Notes (Signed)
[place] Hospital Day(s): 3.   Post op day(s):  [name]   Interval History: Patient seen and examined, no acute events or new complaints overnight. Patient reports continue doing well with new ostomy system, denies leak from ostomy. Refers pain has improved now with itching   Vital signs in last 24 hours: [min-max] current  Temp:  [97.4 F (36.3 C)-98.6 F (37 C)] 98.2 F (36.8 C) (12/19 1231) Pulse Rate:  [75-101] 101 (12/19 1231) Resp:  [16-18] 16 (12/19 1231) BP: (99-125)/(74-90) 99/74 (12/19 1231) SpO2:  [98 %-100 %] 100 % (12/19 1231)     Height: 5\' 7"  (170.2 cm) Weight: 57.5 kg BMI (Calculated): 19.85    Physical Exam:  Constitutional: alert, cooperative and no distress  Respiratory: breathing non-labored at rest  Cardiovascular: regular rate and sinus rhythm  Gastrointestinal: soft, non-tender, and non-distended. Ostomy pink and patent. Ostomy appliance in place and clean.   Labs:  CBC Latest Ref Rng & Units 09/22/2018 09/09/2018 09/04/2018  WBC 4.0 - 10.5 K/uL 7.7 9.9 8.4  Hemoglobin 12.0 - 15.0 g/dL 9.2(L) 8.5(L) 9.8(L)  Hematocrit 36.0 - 46.0 % 29.6(L) 26.3(L) 30.6(L)  Platelets 150 - 400 K/uL 407(H) 279 352   CMP Latest Ref Rng & Units 09/22/2018 09/09/2018 09/04/2018  Glucose 70 - 99 mg/dL 95 93 94  BUN 8 - 23 mg/dL 40(H) 13 19  Creatinine 0.44 - 1.00 mg/dL 1.60(H) 0.83 1.21(H)  Sodium 135 - 145 mmol/L 135 137 136  Potassium 3.5 - 5.1 mmol/L 4.0 3.9 3.9  Chloride 98 - 111 mmol/L 113(H) 112(H) 109  CO2 22 - 32 mmol/L 15(L) 21(L) 17(L)  Calcium 8.9 - 10.3 mg/dL 9.6 8.9 9.8  Total Protein 6.5 - 8.1 g/dL 8.1 6.4(L) 7.9  Total Bilirubin 0.3 - 1.2 mg/dL 0.6 0.8 0.6  Alkaline Phos 38 - 126 U/L 90 50 72  AST 15 - 41 U/L 20 15 20   ALT 0 - 44 U/L 18 10 18     Imaging studies: No new pertinent imaging studies   Assessment/Plan:  62 y.o.femalewith ileostomy in place with appliance malfunction with persistent leakage and severe skin irritation causing  second degree burn. Patient continue doing better. Still has not done the ostomy changes by herself. Instructed to do the change by herself to see if she is ready and able to manage at home. Patient will need ostomy care assistance to continue with the help until patient is completely trained and able to do it by herself.   Arnold Long, MD

## 2018-10-02 NOTE — Care Management (Signed)
Patient admitted from home with  ileostomy in place with appliance malfunction with persistent leakage and severe skin irritation.  Patient lives at home with daughter.  PCP Elisabeth Cara.  Patient now has active medicaid.  Patient has BSC and RW in the home.  Patient denies issues obtaining medications.   WOC RN and MD are recommending home health RN at home after discharge.  For continued ostomy education and management of skin breakdown.  Patient agreeable to services and states that she doesn't really have a preference.  Patient states that she has used [facility] in the past, and "would be fine to use them again".  CMS Medicare.gov Compare Post Acute Care list reviewed with with patient.  Referral made to [facility] with [facility].  RNCM following.

## 2018-10-03 ENCOUNTER — Ambulatory Visit: Payer: Self-pay | Admitting: Family

## 2018-10-03 MED ORDER — NEPRO/CARBSTEADY PO LIQD
237.0000 mL | Freq: Two times a day (BID) | ORAL | Status: DC
  Administered 2018-10-03 – 2018-10-04 (×2): 237 mL via ORAL

## 2018-10-03 NOTE — Progress Notes (Signed)
[facility] Hospital Day(s): 4.   Post op day(s):  [name]   Interval History: Patient seen and examined, no acute events or new complaints overnight. Patient reports still feeling significant pain and itching on the ostomy area, denies leakage.  Vital signs in last 24 hours: [min-max] current  Temp:  [98 F (36.7 C)-98.5 F (36.9 C)] 98 F (36.7 C) (12/20 1359) Pulse Rate:  [79-88] 88 (12/20 1359) Resp:  [16-18] 16 (12/20 0558) BP: (115-128)/(66-76) 115/66 (12/20 1359) SpO2:  [98 %-100 %] 98 % (12/20 1359)     Height: 5\' 7"  (170.2 cm) Weight: 57.5 kg BMI (Calculated): 19.85   Intake/Output this shift:  Total I/O In: 240 [P.O.:240] Out: 500 [Urine:300; Stool:200]   Intake/Output last 2 shifts:  @IOLAST2SHIFTS @   Physical Exam:  Constitutional: alert, cooperative and no distress  Respiratory: breathing non-labored at rest  Cardiovascular: regular rate and sinus rhythm  Gastrointestinal: soft, non-tender, and non-distended. Ileostomy pink and patent   Labs:  CBC Latest Ref Rng & Units 09/22/2018 09/09/2018 09/04/2018  WBC 4.0 - 10.5 K/uL 7.7 9.9 8.4  Hemoglobin 12.0 - 15.0 g/dL 9.2(L) 8.5(L) 9.8(L)  Hematocrit 36.0 - 46.0 % 29.6(L) 26.3(L) 30.6(L)  Platelets 150 - 400 K/uL 407(H) 279 352   CMP Latest Ref Rng & Units 09/22/2018 09/09/2018 09/04/2018  Glucose 70 - 99 mg/dL 95 93 94  BUN 8 - 23 mg/dL 40(H) 13 19  Creatinine 0.44 - 1.00 mg/dL 1.60(H) 0.83 1.21(H)  Sodium 135 - 145 mmol/L 135 137 136  Potassium 3.5 - 5.1 mmol/L 4.0 3.9 3.9  Chloride 98 - 111 mmol/L 113(H) 112(H) 109  CO2 22 - 32 mmol/L 15(L) 21(L) 17(L)  Calcium 8.9 - 10.3 mg/dL 9.6 8.9 9.8  Total Protein 6.5 - 8.1 g/dL 8.1 6.4(L) 7.9  Total Bilirubin 0.3 - 1.2 mg/dL 0.6 0.8 0.6  Alkaline Phos 38 - 126 U/L 90 50 72  AST 15 - 41 U/L 20 15 20   ALT 0 - 44 U/L 18 10 18     Imaging studies: No new pertinent imaging studies   Assessment/Plan:  62 y.o.femalewith ileostomy in place with appliance  malfunction with persistent leakage and severe skin irritation causing second degree burn. Patient healing better. Still with pain. She did ostomy change today and is working. Will observe for 24 hours and if it does not leak and she is able to control the ostomy by herself will be able to be discharged.   Arnold Long, MD

## 2018-10-03 NOTE — Consult Note (Addendum)
[place] Nurse ostomyfollow-upconsultnote Current pouch is intact with good seal, applied 2 days ago. Removed to perform a teaching session.  Patient performed pouch change independently by standing in front of the mirror and discussing pouch application steps, then she was able to apply the pouching system. 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  Stoma is red and viable; slightly above skin level, 1 1/4 inches, os points towards 3:00 o'clock where there is a crease. Previous skin maceration to peristomal edges was completelyresolved when skin was assessed yesterday. Output:Modamt soft thickbrown stool. Ostomy pouching: 1pc; Crusted skin with poder and skin prep to protect, then applied barrier strip in skin fold, barrier ring around one piece flexible pouch, then Tegaderm around pouch edges and a belt to help hold in place.  Education provided:Provided written instructions also for use after discharge. 4sets of supplies left at the bedside and instructions provided for patient and staff nurse use. No family at the bedside but patient is well-informed regarding pouching routinesand is independent with emptying without assistance.She verbalized understanding of the process and from a WOC standpoint, patient is ready to discharge home when the medical team agrees. Recommend home health assistance after discharge for pouching assistance. Enrolled patient in [place] program: Yes, previously Julien Girt MSN, [place], [place], [name], [place]

## 2018-10-04 LAB — CREATININE, SERUM
Creatinine, Ser: 1.22 mg/dL — ABNORMAL HIGH (ref 0.44–1.00)
GFR calc Af Amer: 54 mL/min — ABNORMAL LOW (ref >60)
GFR, EST NON AFRICAN AMERICAN: 47 mL/min — AB (ref >60)

## 2018-10-04 LAB — CBC
HCT: 24.5 % — ABNORMAL LOW (ref 36.0–46.0)
Hemoglobin: 7.7 g/dL — ABNORMAL LOW (ref 12.0–15.0)
MCH: 29.8 pg (ref 26.0–34.0)
MCHC: 31.4 g/dL (ref 30.0–36.0)
MCV: 95 fL (ref 80.0–100.0)
PLATELETS: 272 10*3/uL (ref 150–400)
RBC: 2.58 MIL/uL — ABNORMAL LOW (ref 3.87–5.11)
RDW: 14.6 % (ref 11.5–15.5)
WBC: 7.7 10*3/uL (ref 4.0–10.5)
nRBC: 0 % (ref 0.0–0.2)

## 2018-10-04 MED ORDER — APIXABAN 5 MG PO TABS
5.0000 mg | ORAL_TABLET | Freq: Two times a day (BID) | ORAL | Status: DC
  Administered 2018-10-04: 5 mg via ORAL
  Filled 2018-10-04: qty 1

## 2018-10-04 MED ORDER — LOPERAMIDE HCL 2 MG PO TABS: 2.0000 mg | ORAL_TABLET | ORAL | 0 refills | Status: DC | PRN

## 2018-10-04 NOTE — Progress Notes (Signed)
PHARMACY NOTE: RENAL DOSAGE ADJUSTMENT  Current regimen includes a mismatch between dosage and estimated renal function.  As per policy approved by the Pharmacy & Therapeutics and Medical Executive Committees, the  dosage will be adjusted accordingly.  Current dosage:  apixaban 2.5 mg BID  Indication: afib  Renal Function:  Estimated Creatinine Clearance: 42.3 mL/min (A) (by C-G formula based on SCr of 1.22 mg/dL (H)).    apixaban dosage has been changed back to:  5 mg BID  Additional comments:( 2.5 mg orally twice daily in patients with at least 2 of the following characteristics, age 75 years or older, body weight 60 kg or less, or serum creatinine 1.5 mg/dL (133 mcmol/L) or higher). Weight= 57.5 kg   12/21: Scr now 1.2. Will transition back to home dose of 5 mg bid. Hgb 7.7  Plt 272  Thank you for allowing pharmacy to be a part of this patient's care.  Tashiya Souders A, [facility] 10/04/2018 8:30 AM

## 2018-10-04 NOTE — Discharge Instructions (Signed)
°  Diet: Resume home heart healthy regular diet.   Activity: Increase activity as tolerated.   Wound care: Continue ostomy care as you learn to do it here in the hospital.   Medications: Take Imodium 2 mg tabs twice per day. If the stool gets to liquid, may increase to 3 times per day. If it gets to thick may decrease as needed.   Call office [phone]) at any time if any questions, worsening pain, fevers/chills, bleeding, drainage from incision site, or other concerns.

## 2018-10-04 NOTE — Care Management Note (Signed)
Case Management Note  Patient Details  Name: Danielle [name] MRN: [id number] Date of Birth: April 26, 1956  Subjective/Objective:   Patient to be discharged per MD order. Orders in place for home health services. Patient has recently closed with [facility] and is set to use them again for ostomy maintenance. Per MD visits are only needed once a week or so just for monitoring. Referral confirmed with Jermaine at [facility] care. Patient has supplies.                   Action/Plan:   Expected Discharge Date:  10/04/18               Expected Discharge Plan:  [place]  In-House Referral:     Discharge planning Services  CM Consult  Post Acute Care Choice:  Home Health Choice offered to:  Patient  DME Arranged:    DME Agency:     HH Arranged:  RN [place] Agency:  [facility]  Status of Service:  Completed, signed off  If discussed at [place] of Stay Meetings, dates discussed:    Additional Comments:  Latanya Maudlin, RN 10/04/2018, 10:53 AM

## 2018-10-04 NOTE — Discharge Summary (Signed)
  Patient ID: Danielle [name] MRN: [id number] DOB/AGE: 03/15/1956 62 y.o.  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 Wound care was done and skin healed appropriately. Patient learned to do the ostomy changes and has not leak since 4 days ago. Stool consistency also controlled with Imodium and patient instructed to take Imodium 2-3 times per day as needed to keep stool pasty.   Consults: Wound care nurse  Disposition: Discharge disposition: 01-Home or Self Care       Discharge Instructions    Diet - low sodium heart healthy   Complete by:  As directed    Increase activity slowly   Complete by:  As directed      Allergies as of 10/04/2018   No Known Allergies     Medication List    TAKE these medications   albuterol 108 (90 Base) MCG/ACT inhaler Commonly known as:  PROVENTIL HFA;VENTOLIN HFA Inhale 2 puffs into the lungs every 6 (six) hours as needed for wheezing or shortness of breath.   apixaban 5 MG Tabs tablet Commonly known as:  ELIQUIS Take 1 tablet (5 mg total) by mouth 2 (two) times daily.   budesonide-formoterol 160-4.5 MCG/ACT inhaler Commonly known as:  SYMBICORT Inhale 2 puffs into the lungs 2 (two) times daily.   carvedilol 6.25 MG tablet Commonly known as:  COREG Take 1 tablet (6.25 mg total) by mouth 2 (two) times daily.   citalopram 40 MG tablet Commonly known as:  CELEXA Take 40 mg by mouth daily.   ferrous sulfate 325 (65 FE) MG tablet Take 1 tablet (325 mg total) by mouth 2 (two) times daily with a meal.   furosemide 40 MG tablet Commonly known as:  LASIX Take 40 mg by mouth 2 (two) times daily.   HYDROcodone-acetaminophen 5-325 MG tablet Commonly known as:  NORCO/VICODIN Take 1 tablet by mouth every 6 (six) hours as  needed for moderate pain or severe pain.   loperamide 2 MG tablet Commonly known as:  IMODIUM A-D Take 1 tablet (2 mg total) by mouth as needed for diarrhea or loose stools.   megestrol 400 MG/10ML suspension Commonly known as:  MEGACE Take 7.5 mLs (300 mg total) by mouth 2 (two) times daily.   multivitamin with minerals Tabs tablet Take 1 tablet by mouth daily.   pantoprazole 40 MG tablet Commonly known as:  PROTONIX Take 1 tablet (40 mg total) by mouth 2 (two) times daily before a meal.   potassium chloride 10 MEQ tablet Commonly known as:  K-DUR Take 10 mEq by mouth daily.   traMADol 50 MG tablet Commonly known as:  ULTRAM Take 50 mg by mouth every 6 (six) hours as needed. for pain      Follow-up Information    [facility] Follow up on 10/22/2018.   Specialty:  Cardiology Why:  at 10:20am Contact information: [place], [place], [place] Follow up in 2 week(s).   Specialty:  General Surgery Contact information: [address] [place] [address] [phone]

## 2018-10-04 NOTE — Progress Notes (Signed)
Patient cleared for discharge. Instructions given. Verified pharmacy. Belongings gathered. IV removed. Patient d/c to home via POV.

## 2018-10-06 ENCOUNTER — Ambulatory Visit: Payer: Medicaid Other | Admitting: Radiation Oncology

## 2018-10-22 ENCOUNTER — Other Ambulatory Visit (HOSPITAL_COMMUNITY): Payer: Self-pay

## 2018-10-22 ENCOUNTER — Ambulatory Visit: Payer: Medicaid Other | Attending: Family | Admitting: Family

## 2018-10-22 ENCOUNTER — Encounter: Payer: Self-pay | Admitting: Family

## 2018-10-22 ENCOUNTER — Encounter: Payer: Self-pay | Admitting: Pharmacist

## 2018-10-22 VITALS — BP 126/95 | HR 89 | Resp 18 | Ht 66.0 in | Wt 128.5 lb

## 2018-10-22 DIAGNOSIS — Z87891 Personal history of nicotine dependence: Secondary | ICD-10-CM | POA: Insufficient documentation

## 2018-10-22 DIAGNOSIS — E119 Type 2 diabetes mellitus without complications: Secondary | ICD-10-CM | POA: Insufficient documentation

## 2018-10-22 DIAGNOSIS — I110 Hypertensive heart disease with heart failure: Secondary | ICD-10-CM | POA: Insufficient documentation

## 2018-10-22 DIAGNOSIS — Z7901 Long term (current) use of anticoagulants: Secondary | ICD-10-CM | POA: Diagnosis not present

## 2018-10-22 DIAGNOSIS — J449 Chronic obstructive pulmonary disease, unspecified: Secondary | ICD-10-CM | POA: Diagnosis not present

## 2018-10-22 DIAGNOSIS — I5022 Chronic systolic (congestive) heart failure: Secondary | ICD-10-CM | POA: Insufficient documentation

## 2018-10-22 DIAGNOSIS — Z9049 Acquired absence of other specified parts of digestive tract: Secondary | ICD-10-CM | POA: Insufficient documentation

## 2018-10-22 DIAGNOSIS — Z923 Personal history of irradiation: Secondary | ICD-10-CM | POA: Insufficient documentation

## 2018-10-22 DIAGNOSIS — Z9010 Acquired absence of unspecified breast and nipple: Secondary | ICD-10-CM | POA: Diagnosis not present

## 2018-10-22 DIAGNOSIS — F329 Major depressive disorder, single episode, unspecified: Secondary | ICD-10-CM | POA: Insufficient documentation

## 2018-10-22 DIAGNOSIS — F419 Anxiety disorder, unspecified: Secondary | ICD-10-CM | POA: Insufficient documentation

## 2018-10-22 DIAGNOSIS — Z932 Ileostomy status: Secondary | ICD-10-CM | POA: Diagnosis not present

## 2018-10-22 DIAGNOSIS — Z853 Personal history of malignant neoplasm of breast: Secondary | ICD-10-CM | POA: Diagnosis not present

## 2018-10-22 DIAGNOSIS — Z79899 Other long term (current) drug therapy: Secondary | ICD-10-CM | POA: Insufficient documentation

## 2018-10-22 DIAGNOSIS — Z85118 Personal history of other malignant neoplasm of bronchus and lung: Secondary | ICD-10-CM | POA: Insufficient documentation

## 2018-10-22 DIAGNOSIS — Z9981 Dependence on supplemental oxygen: Secondary | ICD-10-CM | POA: Diagnosis not present

## 2018-10-22 DIAGNOSIS — I10 Essential (primary) hypertension: Secondary | ICD-10-CM

## 2018-10-22 LAB — BASIC METABOLIC PANEL
ANION GAP: 9 (ref 5–15)
BUN: 17 mg/dL (ref 8–23)
CO2: 17 mmol/L — ABNORMAL LOW (ref 22–32)
Calcium: 9.7 mg/dL (ref 8.9–10.3)
Chloride: 112 mmol/L — ABNORMAL HIGH (ref 98–111)
Creatinine, Ser: 1.53 mg/dL — ABNORMAL HIGH (ref 0.44–1.00)
GFR calc non Af Amer: 36 mL/min — ABNORMAL LOW (ref >60)
GFR, EST AFRICAN AMERICAN: 41 mL/min — AB (ref >60)
GLUCOSE: 83 mg/dL (ref 70–99)
Potassium: 3.8 mmol/L (ref 3.5–5.1)
Sodium: 138 mmol/L (ref 135–145)

## 2018-10-22 NOTE — Progress Notes (Signed)
Spoke with Danielle [name] about the Paramedicine program at her Heart Failure clinic appt and she is wanting the program.  Will contact early next week to set up home visit.   [name] [phone]

## 2018-10-22 NOTE — Progress Notes (Signed)
Danielle [name] - Danielle [name] - PHARMACIST COUNSELING NOTE   ASSESSMENT   Danielle [name] is a 25 yof who presents to the [name] for routine follow up. She is feeling generally well and is without complaint. She reports not weighing herself at home.   Adherence assessment   Patient is using pill box as an adherence strategy.   Do you ever forget to take your medication? [] Yes (1) [x] No (0)  Do you ever skip doses due to side effects? [] Yes (1) [x] No (0)  Do you have trouble affording your medicines? [] Yes (1) [x] No (0)  Are you ever unable to pick up your medication due to transportation difficulties? [] Yes (1) [x] No (0)  Do you ever stop taking your medications because you don't believe they are helping? [] Yes (1) [x] No (0)  Total score _0______      Guideline-Directed Medical Therapy/Evidence Based Medicine   ACE/ARB/ARNI: No - losartan stopped d/t frequent AKI   Beta Blocker: Yes   Aldosterone Antagonist: No Diuretic: Yes   Drug-related problem 1: Adherence. Patient has not been weighing herself every day but reports that her weight is up in the office.   Drug-related problem 2: Additional therapy needed. Patient was taken off her ARB and is not on any RAAS type drug although her EF is reduced.   PLAN   DRP1: Reminded patient to weigh herself every day in the morning after the first time she makes water. Call for weight gain of 2 to 3 pounds overnight or 5 pounds in a week.   DRP2: NP wrote for labs today to re-assess kidney function.   SUBJECTIVE   HPI: Danielle [name] presents to [facility] with no complaints but noted increased weight on office scales. She has not been weighing herself every day.    Past Medical History:  Diagnosis Date  . Anxiety   . Arthritis   . Cancer ([place]) left    breast cancer 2000, chemo tx's with total mastectomy and lymph nodes resected.   . Cancer of right lung ([place]) 02/21/2016   rad tx's.   . CHF (congestive  Danielle failure) ([place])   . COPD (chronic obstructive pulmonary disease) ([place])   . Dependence on supplemental oxygen   . Depression   . Diabetes mellitus without complication ([place])   . Danielle murmur   . Hypertension   . Lung nodule   . Lymphedema   . Shortness of breath dyspnea    with exertion  . Status post chemotherapy 2001   left breast cancer  . Status post radiation therapy 2001   left breast cancer      Current Outpatient Medications:  .  albuterol (PROVENTIL HFA;VENTOLIN HFA) 108 (90 Base) MCG/ACT inhaler, Inhale 2 puffs into the lungs every 6 (six) hours as needed for wheezing or shortness of breath., Disp: 1 Inhaler, Rfl: 2 .  apixaban (ELIQUIS) 5 MG TABS tablet, Take 1 tablet (5 mg total) by mouth 2 (two) times daily., Disp: 60 tablet, Rfl: 0 .  budesonide-formoterol (SYMBICORT) 160-4.5 MCG/ACT inhaler, Inhale 2 puffs into the lungs 2 (two) times daily., Disp: 1 Inhaler, Rfl: 12 .  carvedilol (COREG) 6.25 MG tablet, Take 1 tablet (6.25 mg total) by mouth 2 (two) times daily., Disp: 60 tablet, Rfl: 3 .  citalopram (CELEXA) 40 MG tablet, Take 40 mg by mouth daily., Disp: , Rfl:  .  ferrous sulfate 325 (65 FE) MG tablet, Take 1 tablet (325 mg total) by mouth 2 (two) times  daily with a meal., Disp: 60 tablet, Rfl: 3 .  furosemide (LASIX) 40 MG tablet, Take 40 mg by mouth 2 (two) times daily. , Disp: , Rfl:  .  HYDROcodone-acetaminophen (NORCO/VICODIN) 5-325 MG tablet, Take 1 tablet by mouth every 6 (six) hours as needed for moderate pain or severe pain., Disp: , Rfl:  .  loperamide (IMODIUM A-D) 2 MG tablet, Take 1 tablet (2 mg total) by mouth as needed for diarrhea or loose stools., Disp: 30 tablet, Rfl: 0 .  megestrol (MEGACE) 400 MG/10ML suspension, Take 7.5 mLs (300 mg total) by mouth 2 (two) times daily., Disp: 240 mL, Rfl: 0 .  Multiple Vitamin (MULTIVITAMIN WITH MINERALS) TABS tablet, Take 1 tablet by mouth daily., Disp: 30 tablet, Rfl: 1 .  pantoprazole (PROTONIX) 40 MG  tablet, Take 1 tablet (40 mg total) by mouth 2 (two) times daily before a meal., Disp: 60 tablet, Rfl: 0 .  potassium chloride (K-DUR) 10 MEQ tablet, Take 10 mEq by mouth daily., Disp: , Rfl:  .  traMADol (ULTRAM) 50 MG tablet, Take 50 mg by mouth every 6 (six) hours as needed. for pain, Disp: , Rfl: 0    OBJECTIVE    BMP Latest Ref Rng & Units 10/04/2018 09/22/2018 09/09/2018  Glucose 70 - 99 mg/dL - 95 93  BUN 8 - 23 mg/dL - 40(H) 13  Creatinine 0.44 - 1.00 mg/dL 1.22(H) 1.60(H) 0.83  Sodium 135 - 145 mmol/L - 135 137  Potassium 3.5 - 5.1 mmol/L - 4.0 3.9  Chloride 98 - 111 mmol/L - 113(H) 112(H)  CO2 22 - 32 mmol/L - 15(L) 21(L)  Calcium 8.9 - 10.3 mg/dL - 9.6 8.9    Vital signs: HR 89, BP 126/95, weight 128.8 lb  ECHO: Date 04/21/2018, EF 35 to 40%  Cath: Date N/A, EF N/A   DRUGS TO AVOID IN Danielle FAILURE  Drug or Class Mechanism  Analgesics . NSAIDs . COX-2 inhibitors . Glucocorticoids  Sodium and water retention, increased systemic vascular resistance, decreased response to diuretics   Diabetes Medications . Metformin . Thiazolidinediones o Rosiglitazone (Avandia) o Pioglitazone (Actos) . DPP4 Inhibitors o Saxagliptin (Onglyza) o Sitagliptin (Januvia)   Lactic acidosis Possible calcium channel blockade   Unknown  Antiarrhythmics . Class I  o Flecainide o Disopyramide . Class III o Sotalol . Other o Dronedarone  Negative inotrope, proarrhythmic   Proarrhythmic, beta blockade  Negative inotrope  Antihypertensives . Alpha Blockers o Doxazosin . Calcium Channel Blockers o Diltiazem o Verapamil o Nifedipine . Central Alpha Adrenergics o Moxonidine . Peripheral Vasodilators o Minoxidil  Increases renin and aldosterone  Negative inotrope    Possible sympathetic withdrawal  Unknown  Anti-infective . Itraconazole . Amphotericin B  Negative inotrope Unknown  Hematologic . Anagrelide . Cilostazol   Possible inhibition of PD IV Inhibition  of PD III causing arrhythmias  Neurologic/Psychiatric . Stimulants . Anti-Seizure Drugs o Carbamazepine o Pregabalin . Antidepressants o Tricyclics o Citalopram . Parkinsons o Bromocriptine o Pergolide o Pramipexole . Antipsychotics o Clozapine . Antimigraine o Ergotamine o Methysergide . Appetite suppressants . Bipolar o Lithium  Peripheral alpha and beta agonist activity  Negative inotrope and chronotrope Calcium channel blockade  Negative inotrope, proarrhythmic Dose-dependent QT prolongation  Excessive serotonin activity/valvular damage Excessive serotonin activity/valvular damage Unknown  IgE mediated hypersensitivy, calcium channel blockade  Excessive serotonin activity/valvular damage Excessive serotonin activity/valvular damage Valvular damage  Direct myofibrillar degeneration, adrenergic stimulation  Antimalarials . Chloroquine . Hydroxychloroquine Intracellular inhibition of lysosomal enzymes  Urologic  Agents . Alpha Blockers o Doxazosin o Prazosin o Tamsulosin o Terazosin  Increased renin and aldosterone  Adapted from Page RL, et al. "Drugs That May Cause or Exacerbate Danielle Failure: A Scientific Statement from the [place]." Circulation 2016; 174:B44-H67. DOI: 10.1161/CIR.0000000000000426   COUNSELING POINTS/CLINICAL PEARLS  Carvedilol (Goal: weight less than 85 kg is 25 mg BID, weight greater than 85 kg is 50 mg BID)  Patient should avoid activities requiring coordination until drug effects are realized, as drug may cause dizziness.  This drug may cause diarrhea, nausea, vomiting, arthralgia, back pain, myalgia, headache, vision disorder, erectile dysfunction, reduced libido, or fatigue.  Instruct patient to report signs/symptoms of adverse cardiovascular effects such as hypotension (especially in elderly patients), arrhythmias, syncope, palpitations, angina, or edema.  Drug may mask symptoms of hypoglycemia. Advise diabetic  patients to carefully monitor blood sugar levels.  Patient should take drug with food.  Advise patient against sudden discontinuation of drug.  Furosemide  Drug causes sun-sensitivity. Advise patient to use sunscreen and avoid tanning beds. Patient should avoid activities requiring coordination until drug effects are realized, as drug may cause dizziness, vertigo, or blurred vision. This drug may cause hyperglycemia, hyperuricemia, constipation, diarrhea, loss of appetite, nausea, vomiting, purpuric disorder, cramps, spasticity, asthenia, headache, paresthesia, or scaling eczema. Instruct patient to report unusual bleeding/bruising or signs/symptoms of hypotension, infection, pancreatitis, or ototoxicity (tinnitus, hearing impairment). Advise patient to report signs/symptoms of a severe skin reactions (flu-like symptoms, spreading red rash, or skin/mucous membrane blistering) or erythema multiforme. Instruct patient to eat high-potassium foods during drug therapy, as directed by healthcare professional.  Patient should not drink alcohol while taking this drug.  MEDICATION ADHERENCES TIPS AND STRATEGIES 1. Taking medication as prescribed improves patient outcomes in Danielle failure (reduces hospitalizations, improves symptoms, increases survival) 2. Side effects of medications can be managed by decreasing doses, switching agents, stopping drugs, or adding additional therapy. Please let someone in the [place] [name] know if you have having bothersome side effects so we can modify your regimen. Do not alter your medication regimen without talking to [place].  3. Medication reminders can help patients remember to take drugs on time. If you are missing or forgetting doses you can try linking behaviors, using pill boxes, or an electronic reminder like an alarm on your phone or an app. Some people can also get automated phone calls as medication reminders.   Time spent: 10 minutes  Danielle [name],  Pharm.D. Clinical Pharmacist 10/22/2018

## 2018-10-22 NOTE — Patient Instructions (Addendum)
Resume weighing daily and call for an overnight weight gain of > 2 pounds or a weekly weight gain of >5 pounds. 

## 2018-10-22 NOTE — Progress Notes (Signed)
Patient ID: Danielle [name], female    DOB: Apr 05, 1956, 63 y.o.   MRN: [id number]  HPI  Danielle [name] is a 63 y/o female with a history of breast/ lung cancer, DM, HTN, COPD, anxiety, depression, lymphedema, previous tobacco use and chronic heart failure.   Echo report from 04/21/18 reviewed and showed an EF of 35-40% along with mild AS/ trivial AR and moderate MR.   In the last 4 months, patient has been admitted 6 times and been to the ED 3 times with the most recent being 09/28/18. Admitted 06/03/18 due to acute renal failure. Given IV hydration with improvement of renal function. Wound care consult obtained due to leakage from ostomy site. Discharged after 2 days. Was in the ED 05/23/18 due to fever and redness around ostomy site. Treated with antibiotics and released. Was in the ED 05/20/18 due to rectal bleeding. Surgery saw patient and she was released. Admitted 04/21/18 due to COPD/HF exacerbation. Initially needed IV lasix and then transitioned to oral diuretics along with prednisone. Pulmonology, vascular, cardiology, surgery, GI, oncology and palliative care consults were all obtained. Head CT done due to acute encephalopathy which was negative. Needed TPN due to severe malnutrition and then advanced to a soft diet. Had ischemic colitis which required surgery (right hemicolectomy and an ileostomy). Discharged after 26 days.   She presents today for a follow-up visit with a chief complaint of moderate fatigue upon minimal exertion. She describes this as chronic in nature having been present for several years. She has associated shortness of breath, palpitations and headaches along with this. She denies any difficulty sleeping, abdominal distention, pedal edema, chest pain, dizziness or weight gain.   Past Medical History:  Diagnosis Date  . Anxiety   . Arthritis   . Cancer ([place]) left    breast cancer 2000, chemo tx's with total mastectomy and lymph nodes resected.   . Cancer of right lung ([place])  02/21/2016   rad tx's.   . CHF (congestive heart failure) ([place])   . COPD (chronic obstructive pulmonary disease) ([place])   . Dependence on supplemental oxygen   . Depression   . Diabetes mellitus without complication ([place])   . Heart murmur   . Hypertension   . Lung nodule   . Lymphedema   . Shortness of breath dyspnea    with exertion  . Status post chemotherapy 2001   left breast cancer  . Status post radiation therapy 2001   left breast cancer   Past Surgical History:  Procedure Laterality Date  . Breast Biospy Left    [facility]  . BREAST SURGERY    . COLONOSCOPY N/A 04/30/2018   Procedure: COLONOSCOPY;  Surgeon: Virgel Manifold, MD;  Location: [facility] ENDOSCOPY;  Service: Endoscopy;  Laterality: N/A;  . COLONOSCOPY N/A 07/22/2018   Procedure: COLONOSCOPY;  Surgeon: Virgel Manifold, MD;  Location: [facility] ENDOSCOPY;  Service: Endoscopy;  Laterality: N/A;  . DILATION AND CURETTAGE OF UTERUS    . ELECTROMAGNETIC NAVIGATION BROCHOSCOPY Right 04/11/2016   Procedure: ELECTROMAGNETIC NAVIGATION BRONCHOSCOPY;  Surgeon: Vilinda Boehringer, MD;  Location: [facility] ORS;  Service: Cardiopulmonary;  Laterality: Right;  . ESOPHAGOGASTRODUODENOSCOPY N/A 07/22/2018   Procedure: ESOPHAGOGASTRODUODENOSCOPY (EGD);  Surgeon: Virgel Manifold, MD;  Location: [facility] ENDOSCOPY;  Service: Endoscopy;  Laterality: N/A;  . ESOPHAGOGASTRODUODENOSCOPY (EGD) WITH PROPOFOL N/A 05/07/2018   Procedure: ESOPHAGOGASTRODUODENOSCOPY (EGD) WITH PROPOFOL;  Surgeon: Lucilla Lame, MD;  Location: [facility] ENDOSCOPY;  Service: Endoscopy;  Laterality: N/A;  . history of colonoscopy]    .  ILEOSCOPY N/A 07/22/2018   Procedure: ILEOSCOPY THROUGH STOMA;  Surgeon: Virgel Manifold, MD;  Location: [facility] ENDOSCOPY;  Service: Endoscopy;  Laterality: N/A;  . ILEOSTOMY    . ILEOSTOMY N/A 09/08/2018   Procedure: ILEOSTOMY REVISION POSSIBLE CREATION;  Surgeon: Herbert Pun, MD;  Location: [facility] ORS;  Service: General;  Laterality: N/A;  .  ILEOSTOMY CLOSURE N/A 08/15/2018   Procedure: DILATION OF ILEOSTOMY STRICTURE;  Surgeon: Herbert Pun, MD;  Location: [facility] ORS;  Service: General;  Laterality: N/A;  . LAPAROTOMY Right 05/04/2018   Procedure: EXPLORATORY LAPAROTOMY right colectomy right and left ostomy;  Surgeon: Herbert Pun, MD;  Location: [facility] ORS;  Service: General;  Laterality: Right;  . LUNG BIOPSY    . MASTECTOMY Left    2000, [facility]  . ROTATOR CUFF REPAIR Right    [facility]   Family History  Problem Relation Age of Onset  . Breast cancer Mother 78  . Cancer Mother        Breast   . Cirrhosis Father   . Breast cancer Paternal Aunt 51  . Cancer Maternal Aunt        Breast    Social History   Tobacco Use  . Smoking status: Former Smoker    Packs/day: 0.50    Years: 20.00    Pack years: 10.00    Types: Cigarettes    Last attempt to quit: 07/02/2012    Years since quitting: 6.3  . Smokeless tobacco: Never Used  . Tobacco comment: quit 2014  Substance Use Topics  . Alcohol use: Yes    Comment: Occasionally   No Known Allergies  Prior to Admission medications   Medication Sig Start Date End Date Taking? Authorizing Provider  albuterol (PROVENTIL HFA;VENTOLIN HFA) 108 (90 Base) MCG/ACT inhaler Inhale 2 puffs into the lungs every 6 (six) hours as needed for wheezing or shortness of breath. 04/24/18  Yes Demetrios Loll, MD  apixaban (ELIQUIS) 5 MG TABS tablet Take 1 tablet (5 mg total) by mouth 2 (two) times daily. 06/23/18  Yes [name], MD  budesonide-formoterol (SYMBICORT) 160-4.5 MCG/ACT inhaler Inhale 2 puffs into the lungs 2 (two) times daily. 04/24/18  Yes Demetrios Loll, MD  carvedilol (COREG) 6.25 MG tablet Take 1 tablet (6.25 mg total) by mouth 2 (two) times daily. 06/24/18  Yes ,  A, FNP  citalopram (CELEXA) 40 MG tablet Take 40 mg by mouth daily.   Yes [provider]  ferrous sulfate 325 (65 FE) MG tablet Take 1 tablet (325 mg total) by mouth 2 (two) times daily with  a meal. 07/02/18  Yes [name], MD  furosemide (LASIX) 40 MG tablet Take 40 mg by mouth 2 (two) times daily.    Yes [provider]  HYDROcodone-acetaminophen (NORCO/VICODIN) 5-325 MG tablet Take 1 tablet by mouth every 6 (six) hours as needed for moderate pain or severe pain.   Yes [provider]  loperamide (IMODIUM A-D) 2 MG tablet Take 1 tablet (2 mg total) by mouth as needed for diarrhea or loose stools. 10/04/18  Yes Herbert Pun, MD  megestrol (MEGACE) 400 MG/10ML suspension Take 7.5 mLs (300 mg total) by mouth 2 (two) times daily. 06/05/18  Yes Fritzi Mandes, MD  Multiple Vitamin (MULTIVITAMIN WITH MINERALS) TABS tablet Take 1 tablet by mouth daily. 06/06/18  Yes Fritzi Mandes, MD  pantoprazole (PROTONIX) 40 MG tablet Take 1 tablet (40 mg total) by mouth 2 (two) times daily before a meal. 07/23/18  Yes Hillary Bow, MD  potassium chloride (K-DUR) 10 MEQ tablet Take 10 mEq by mouth daily.   Yes [provider]  traMADol (ULTRAM) 50 MG tablet Take 50 mg by mouth every 6 (six) hours as needed. for pain 07/28/18  Yes [provider]    Review of Systems  Constitutional: Positive for fatigue. Negative for appetite change.  HENT: Positive for rhinorrhea. Negative for congestion and sore throat.   Eyes: Negative.   Respiratory: Positive for shortness of breath (with moderate exertion). Negative for cough and chest tightness.   Cardiovascular: Positive for palpitations. Negative for chest pain and leg swelling.  Gastrointestinal: Negative for abdominal distention and abdominal pain.  Endocrine: Negative.   Genitourinary: Negative.   Musculoskeletal: Negative for back pain and neck pain.  Skin: Negative.   Allergic/Immunologic: Negative.   Neurological: Positive for headaches. Negative for dizziness and light-headedness.  Hematological: Negative for adenopathy. Does not bruise/bleed easily.  Psychiatric/Behavioral: Negative for dysphoric  mood and sleep disturbance (sleeping on 2 pillows). The patient is not nervous/anxious.    Vitals:   10/22/18 1020  BP: (!) 126/95  Pulse: 89  Resp: 18  SpO2: 100%  Weight: 128 lb 8 oz (58.3 kg)  Height: 5\' 6"  (1.676 m)   Wt Readings from Last 3 Encounters:  10/22/18 128 lb 8 oz (58.3 kg)  09/28/18 126 lb 12.2 oz (57.5 kg)  09/22/18 123 lb (55.8 kg)   Lab Results  Component Value Date   CREATININE 1.53 (H) 10/22/2018   CREATININE 1.22 (H) 10/04/2018   CREATININE 1.60 (H) 09/22/2018    Physical Exam Vitals signs and nursing note reviewed.  Constitutional:      Appearance: She is well-developed.  HENT:     Head: Normocephalic and atraumatic.  Neck:     Musculoskeletal: Normal range of motion and neck supple.     Vascular: No JVD.  Cardiovascular:     Rate and Rhythm: Normal rate. Rhythm irregular.  Pulmonary:     Effort: Pulmonary effort is normal. No respiratory distress.     Breath sounds: No wheezing or rales.  Abdominal:     General: There is no distension.     Palpations: Abdomen is soft.  Musculoskeletal:     Right lower leg: She exhibits no tenderness. No edema.     Left lower leg: She exhibits no tenderness. No edema.  Skin:    General: Skin is warm and dry.  Neurological:     Mental Status: She is alert and oriented to person, place, and time.  Psychiatric:        Behavior: Behavior normal.    Assessment & Plan:  1: Chronic heart failure with reduced ejection fraction- - NYHA class III - euvolemic today - not weighing daily and she was instructed to resume weighing daily and to call for an overnight weight gain of >2 pounds or a weekly weight gain of >5 pounds - weight up 3 pounds since she was last here 4 months ago - wearing oxygen at 2L as needed - saw cardiology Danielle [name]) 10/23/17 - BNP 09/04/18 was 182.0 - PharmD reconciled medications with the patient - due to frequent admissions/ ED visits, will refer to paramedic program - patient says that  she has received her flu and pneumonia vaccines for this season  2: HTN- - BP mildly elevated today but she says that she hasn't taken her medications yet today - seeing PCP Danielle [name] @ [facility]) in about a week - BMP 09/22/18 reviewed and showed sodium  135, potassium 4.0, creatinine 1.60 and GFR 39 - will get BMP today to see if renal function would allow for entresto   Medication bottles were reviewed.   Return in 6 weeks or sooner for any questions/problems before then.

## 2018-10-23 ENCOUNTER — Telehealth: Payer: Self-pay | Admitting: Pharmacy Technician

## 2018-10-23 NOTE — Telephone Encounter (Signed)
Patient has Medicaid.  No longer meets [facility]'s eligibility criteria.  [facility]

## 2018-10-25 IMAGING — US US EXTREM LOW VENOUS BILAT
1 series · 13 of 24 positions shown · non-contrast
Comparison: None.

CLINICAL DATA: Bilateral lower extremity pain and edema. History of
fall 2 weeks ago. History of breast cancer. Evaluate for DVT.



[Series 1: us extrem low venous bilat · 0.07mm/px · 13 of 61 slices shown]
[im 1/61]
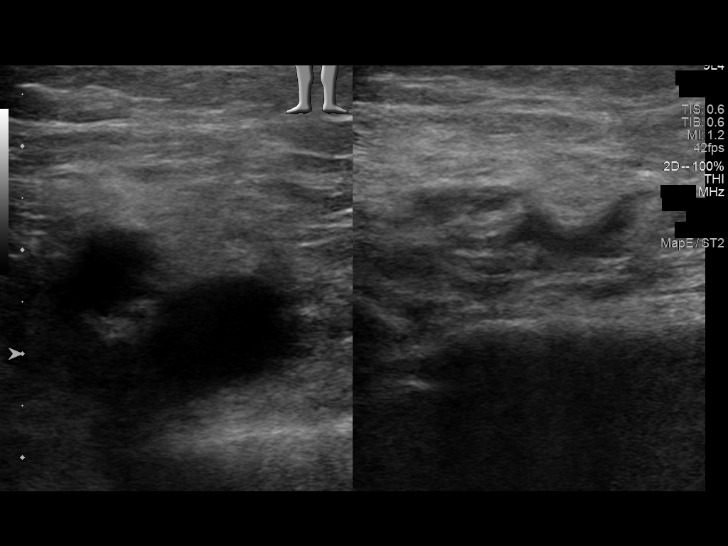
[im 6/61]
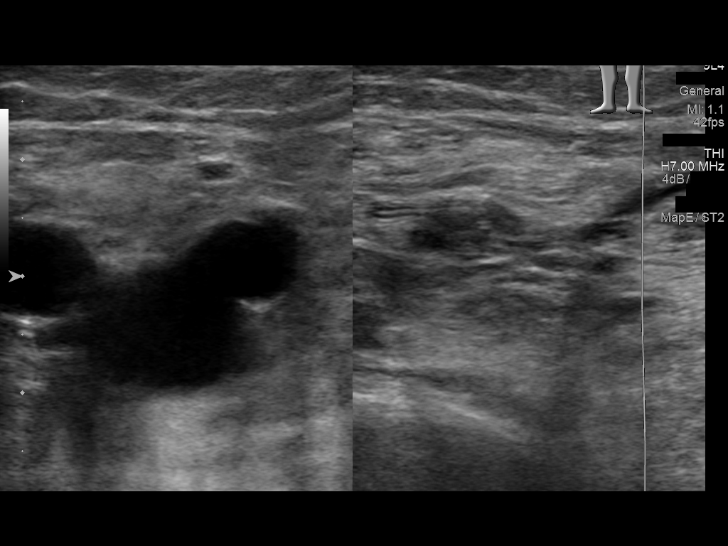
[im 11/61]
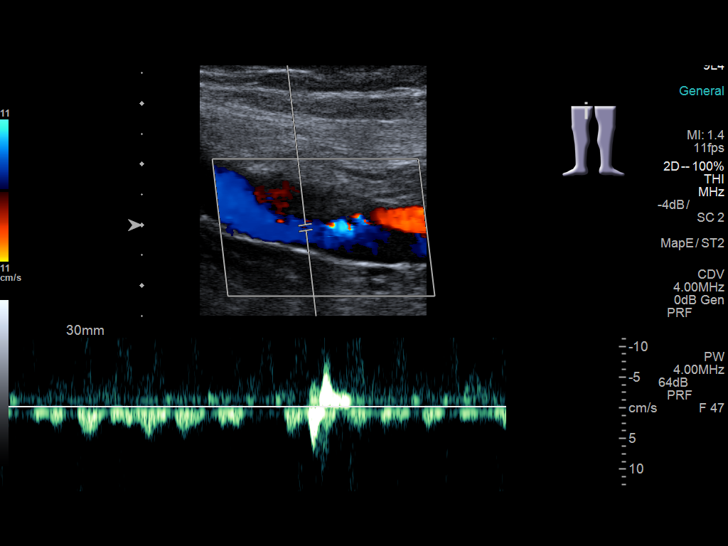
[im 16/61]
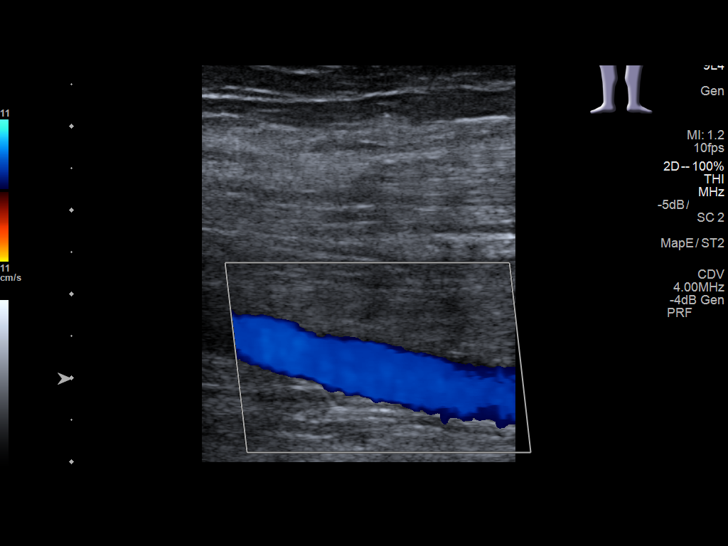
[im 21/61]
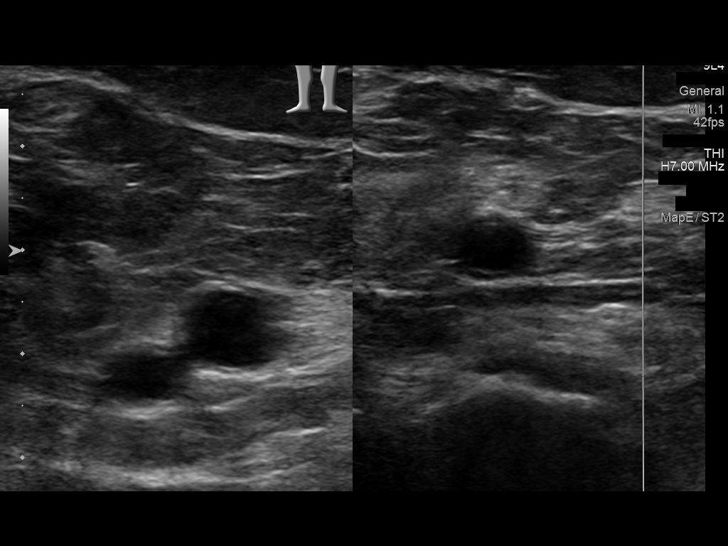
[im 27/61]
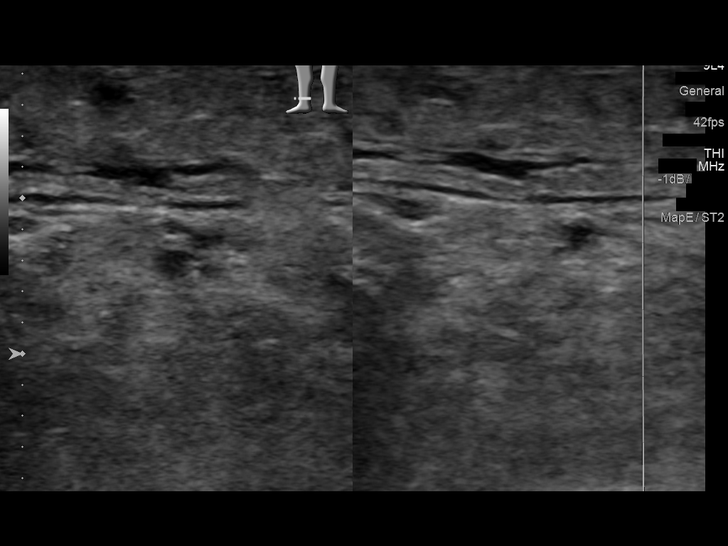
[im 32/61]
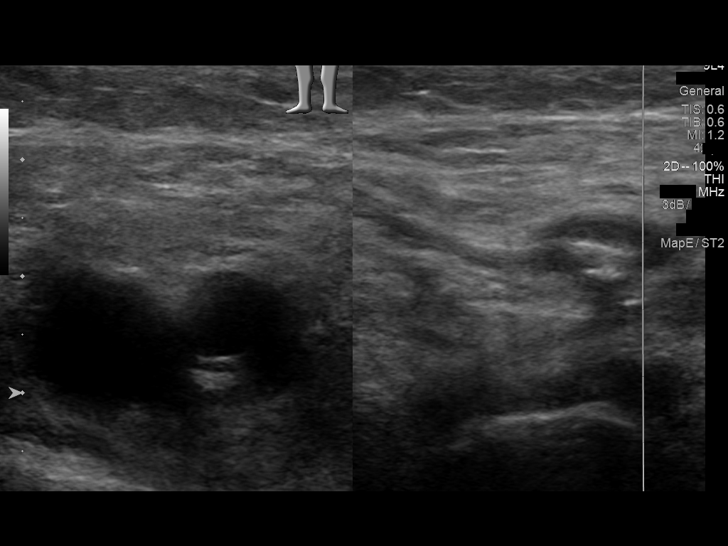
[im 34/61]
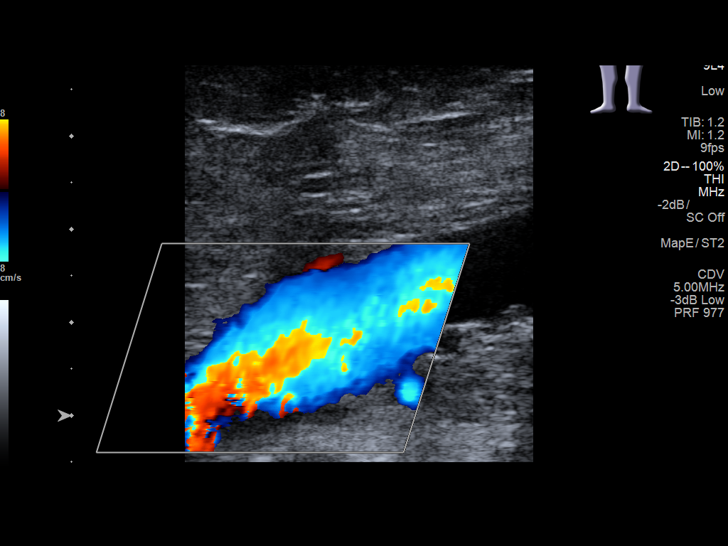
[im 40/61]
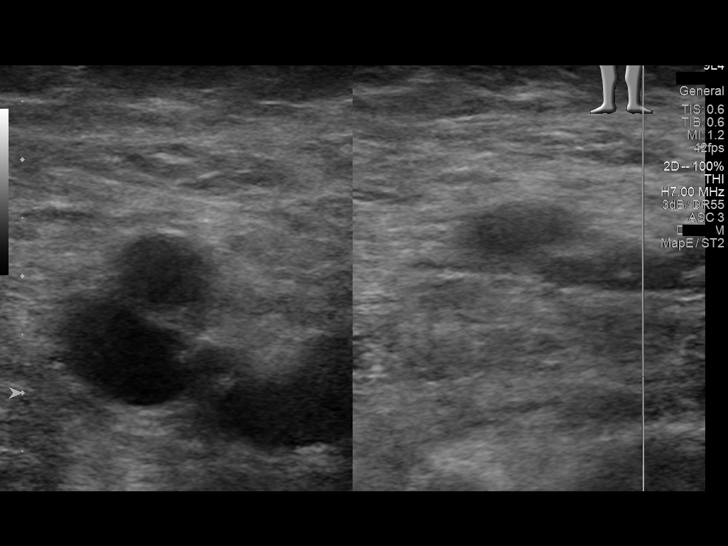
[im 45/61]
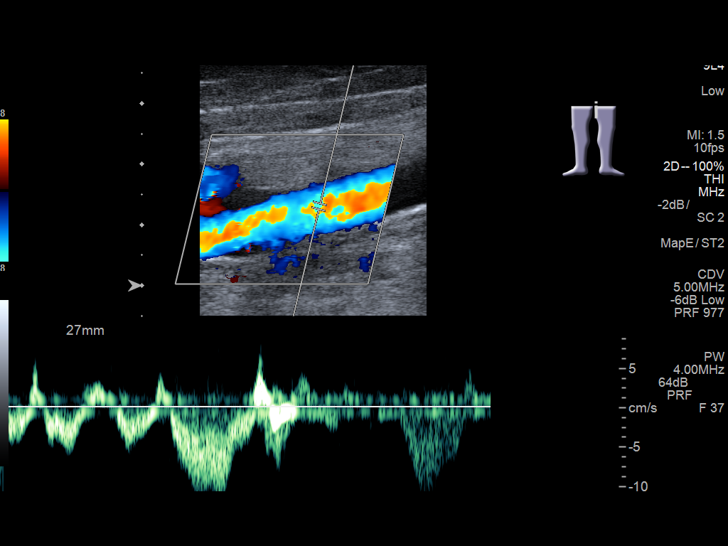
[im 50/61]
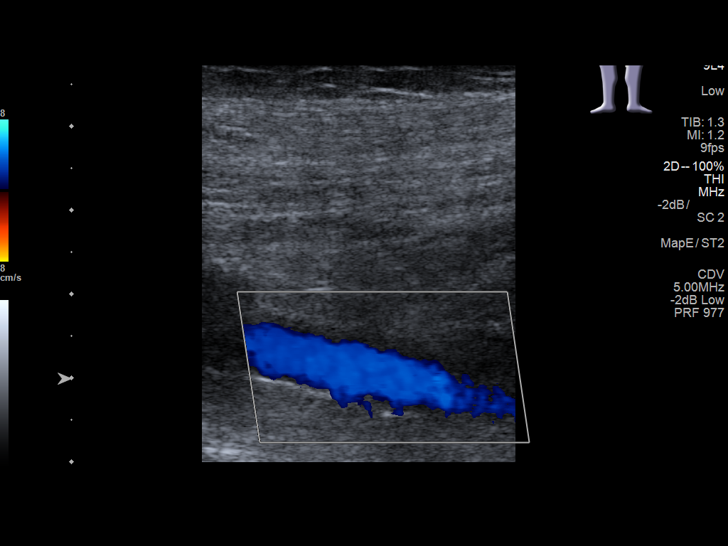
[im 55/61]
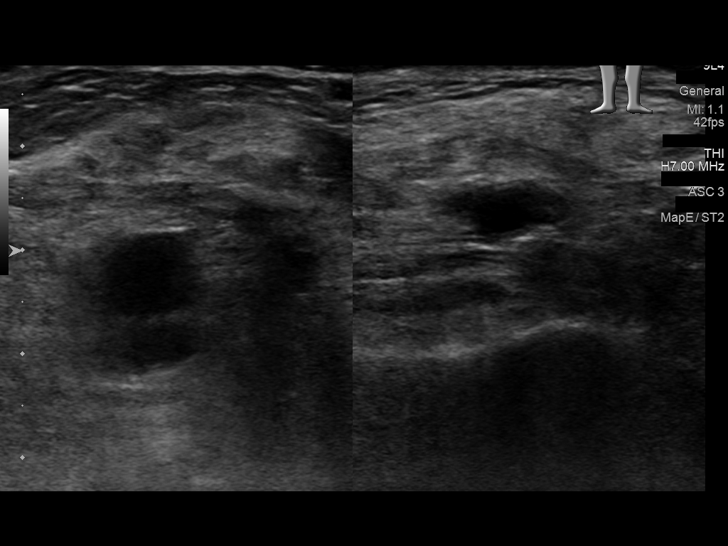
[im 61/61]
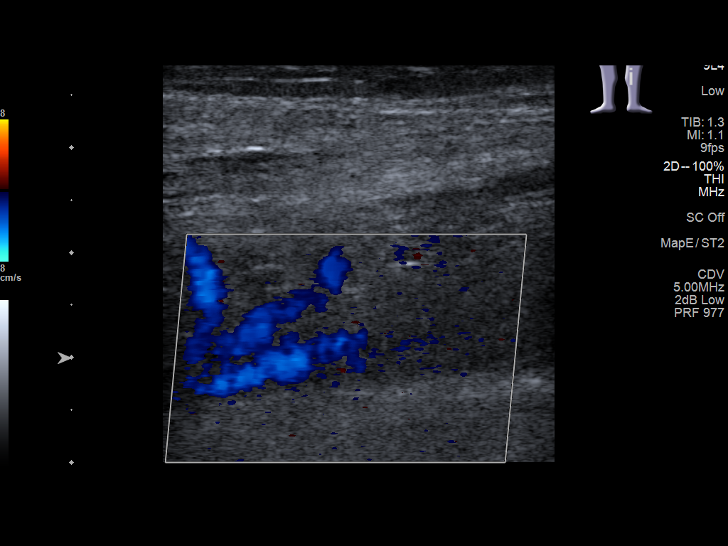

[13 of 24 positions shown; findings below may reference images not displayed]

FINDINGS: RIGHT LOWER EXTREMITY

Common Femoral Vein: No evidence of thrombus. Normal
compressibility, respiratory phasicity and response to augmentation.

Saphenofemoral Junction: No evidence of thrombus. Normal
compressibility and flow on color Doppler imaging.

Profunda Femoral Vein: No evidence of thrombus. Normal
compressibility and flow on color Doppler imaging.

Femoral Vein: No evidence of thrombus. Normal compressibility,
respiratory phasicity and response to augmentation.

Popliteal Vein: No evidence of thrombus. Normal compressibility,
respiratory phasicity and response to augmentation.

Calf Veins: No evidence of thrombus. Normal compressibility and flow
on color Doppler imaging.

Superficial Great Saphenous Vein: No evidence of thrombus. Normal
compressibility.

Venous Reflux:  None.

Other Findings:  None.

LEFT LOWER EXTREMITY

Common Femoral Vein: No evidence of thrombus. Normal
compressibility, respiratory phasicity and response to augmentation.

Saphenofemoral Junction: No evidence of thrombus. Normal
compressibility and flow on color Doppler imaging.

Profunda Femoral Vein: No evidence of thrombus. Normal
compressibility and flow on color Doppler imaging.

Femoral Vein: No evidence of thrombus. Normal compressibility,
respiratory phasicity and response to augmentation.

Popliteal Vein: No evidence of thrombus. Normal compressibility,
respiratory phasicity and response to augmentation.

Calf Veins: No evidence of thrombus. Normal compressibility and flow
on color Doppler imaging.

Superficial Great Saphenous Vein: No evidence of thrombus. Normal
compressibility.

Venous Reflux:  None.

Other Findings:  None.
IMPRESSION: No evidence of DVT within either lower extremity.

## 2018-10-28 IMAGING — MG MM DIGITAL SCREENING UNILAT*R* W/ TOMO W/ CAD
7 series · 8 of 15 positions shown · non-contrast
Comparison: Previous exam(s).

CLINICAL DATA: Screening.

EXAM:
2D DIGITAL SCREENING UNILATERAL RIGHT MAMMOGRAM WITH CAD AND ADJUNCT
TOMO

[R MLO (1 of 2)]
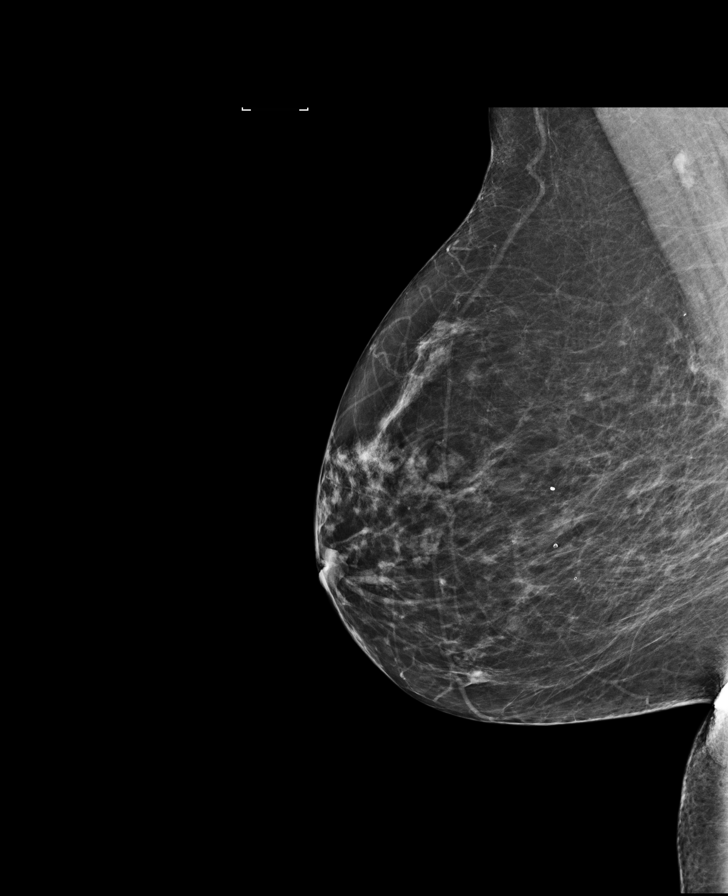

[R MLO synth-2D]
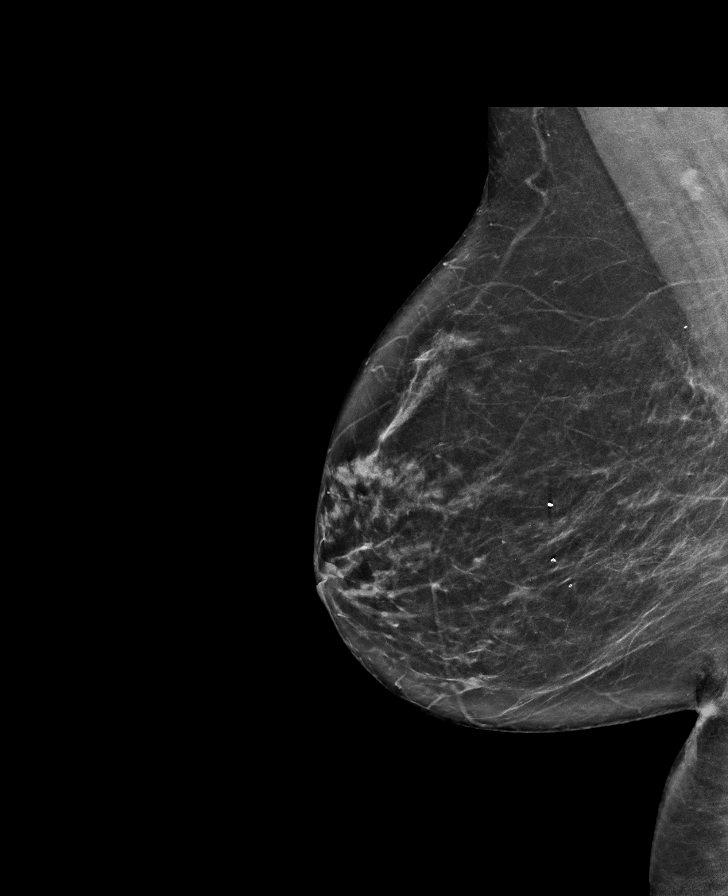

[R MLO (2 of 2)]
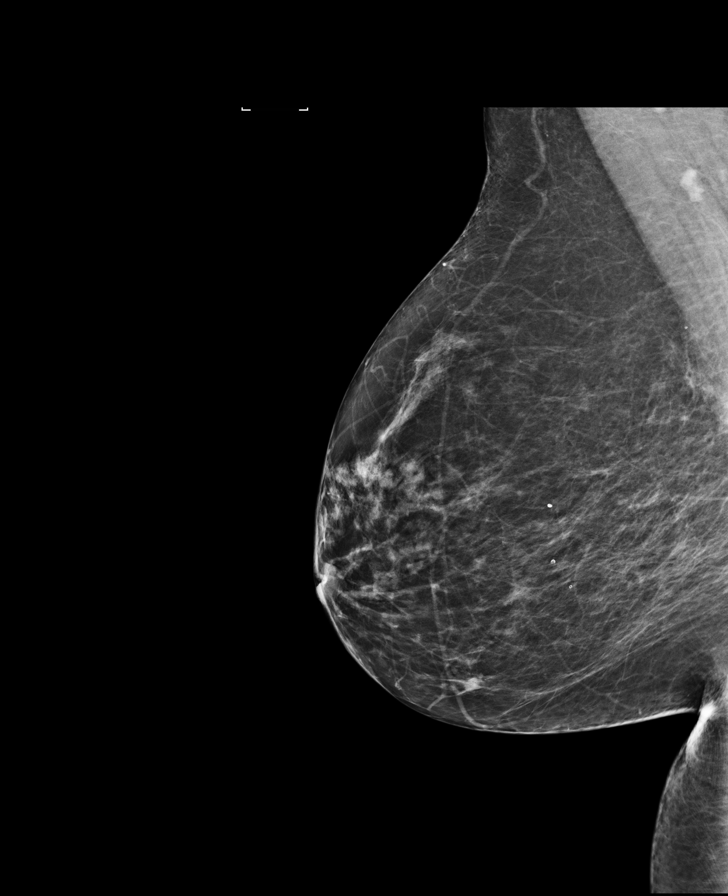

[R CC synth-2D]
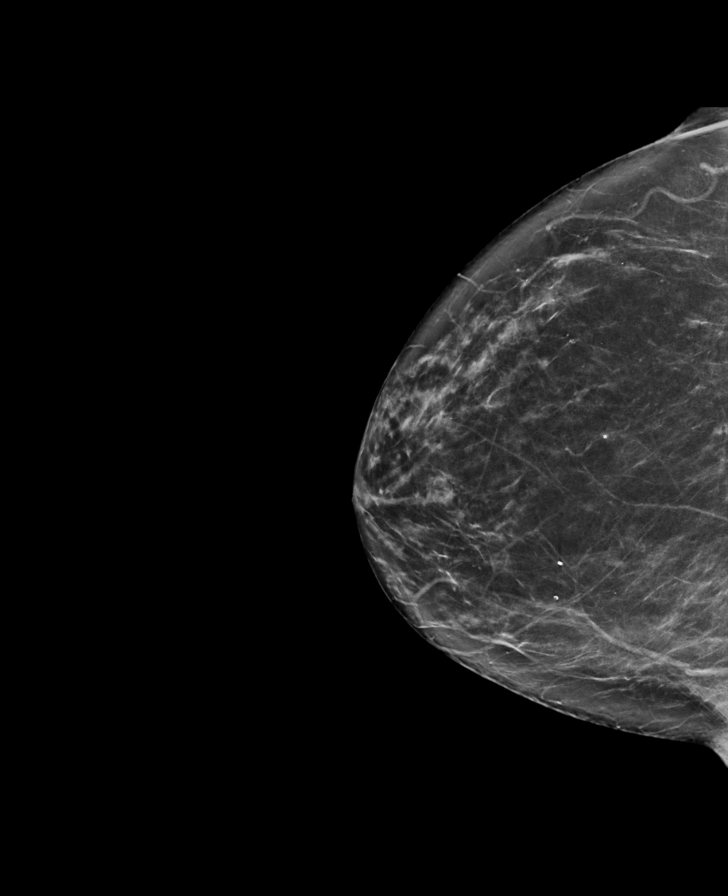

[R CC]
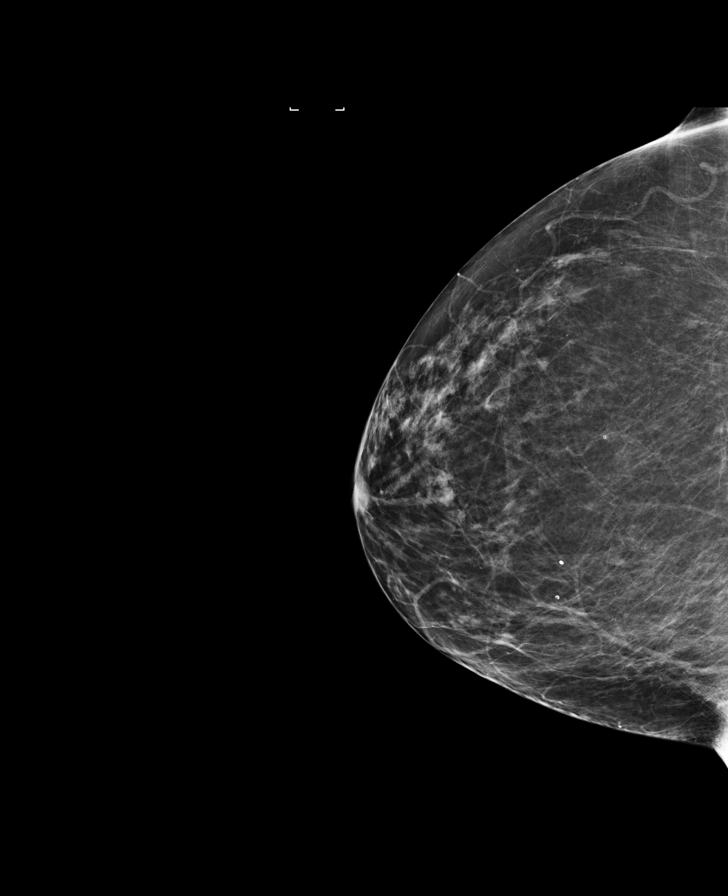

[R MLO tomo · 2 of 72 frames shown]
[frame 24/72]
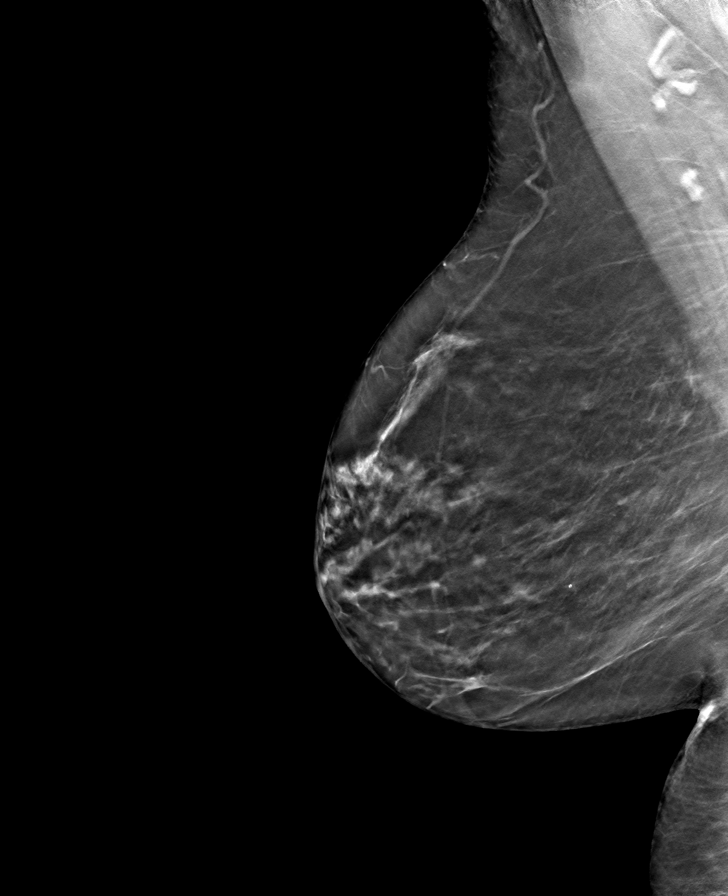
[frame 37/72]
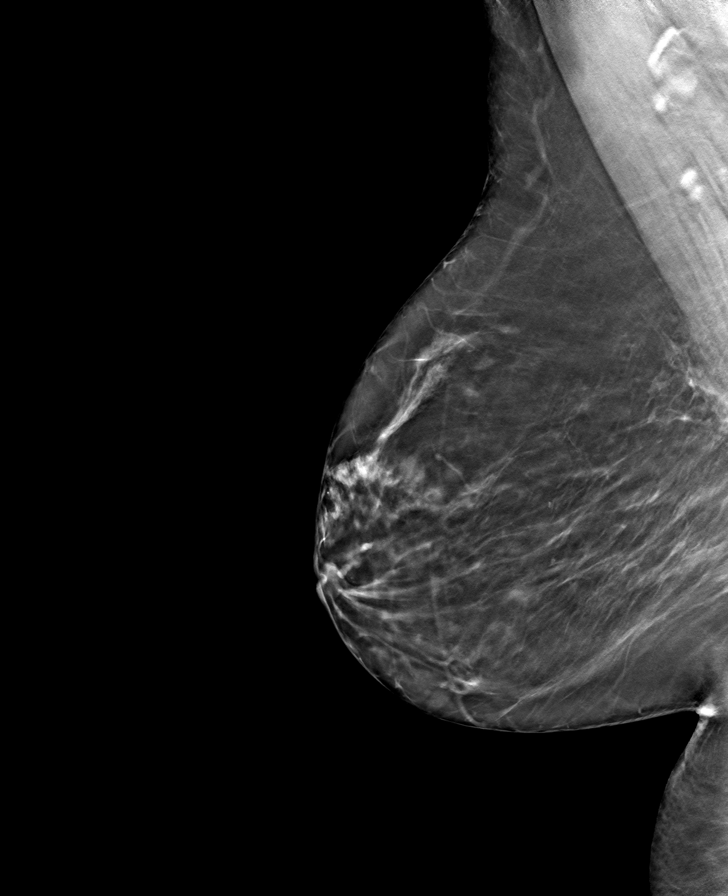

[R CC tomo · tomo slice 35/68.0]
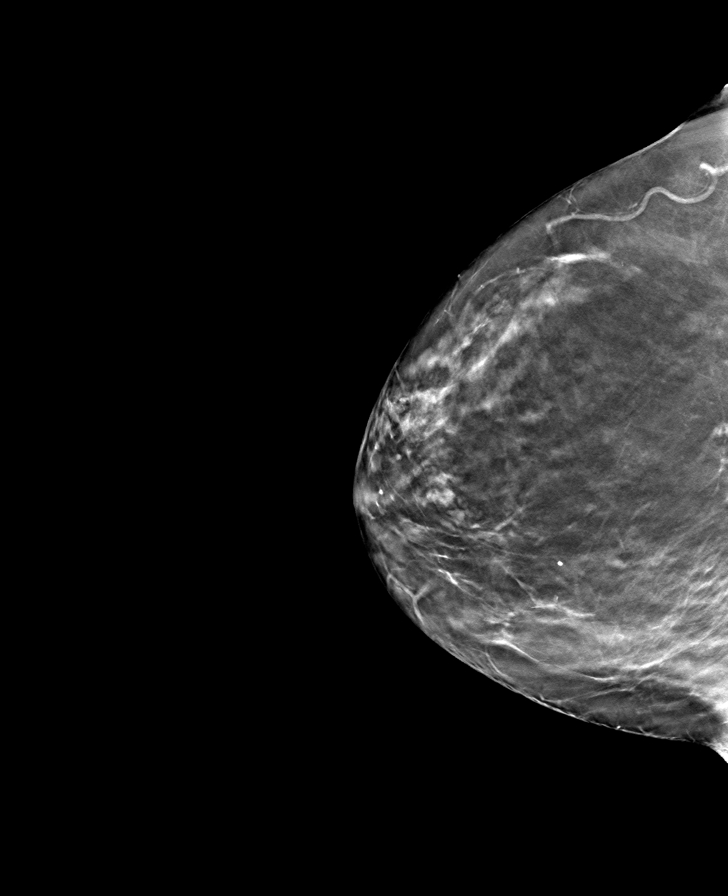

[8 of 15 positions shown; findings below may reference images not displayed]

ACR Breast Density Category c: The breast tissue is heterogeneously
dense, which may obscure small masses.
FINDINGS: The patient has had a left mastectomy. There are no findings
suspicious for malignancy. Images were processed with CAD.
IMPRESSION: No mammographic evidence of malignancy. A result letter of this
screening mammogram will be mailed directly to the patient.

RECOMMENDATION:
Screening mammogram in one year.  (Code:H7-4-4V7)

BI-RADS CATEGORY  1: Negative.

## 2018-10-29 ENCOUNTER — Telehealth (HOSPITAL_COMMUNITY): Payer: Self-pay

## 2018-10-29 NOTE — Telephone Encounter (Signed)
Attempted to set up appt for home visit.  Left message.  Will contact back in a few days if not heard back.   [place] [phone]

## 2018-10-30 ENCOUNTER — Other Ambulatory Visit: Payer: Self-pay | Admitting: Nurse Practitioner

## 2018-10-30 DIAGNOSIS — Z1231 Encounter for screening mammogram for malignant neoplasm of breast: Secondary | ICD-10-CM

## 2018-11-03 ENCOUNTER — Ambulatory Visit: Payer: Medicaid Other

## 2018-11-04 ENCOUNTER — Telehealth (HOSPITAL_COMMUNITY): Payer: Self-pay

## 2018-11-04 NOTE — Telephone Encounter (Signed)
Spoke with Danielle [name] today and she advised sorry for the delay on making an appt with me but her and her doctors have been talking and she might be moving back to her place which is in [name].  She will know more toward end of the week.  If so she will be moving back next week.  She has been staying with her daughter since she was sick but since she is doing better she wants to go home.  She knows someone in home health that will be visiting with her there in [place].  Will follow up with her Thursday to find out what she is doing.  Did advise her if she came back to [facility] to just let Otila Kluver know and we can get her on my service then.   [place] [phone]

## 2018-11-06 ENCOUNTER — Ambulatory Visit: Payer: Medicaid Other

## 2018-11-13 ENCOUNTER — Ambulatory Visit: Payer: Medicaid Other | Admitting: Radiation Oncology

## 2018-11-14 NOTE — Progress Notes (Signed)
Patient ID: Danielle [name], female    DOB: 09-Dec-1955, 63 y.o.   MRN: [id number]  HPI  Danielle [name] is a 63 y/o female with a history of breast/ lung cancer, DM, HTN, COPD, anxiety, depression, lymphedema, previous tobacco use and chronic heart failure.   Echo report from 04/21/18 reviewed and showed an EF of 35-40% along with mild AS/ trivial AR and moderate MR.   In the last 4 months, patient has been admitted 6 times and been to the ED 3 times with the most recent being 09/28/18. Admitted 06/03/18 due to acute renal failure. Given IV hydration with improvement of renal function. Wound care consult obtained due to leakage from ostomy site. Discharged after 2 days. Was in the ED 05/23/18 due to fever and redness around ostomy site. Treated with antibiotics and released. Was in the ED 05/20/18 due to rectal bleeding. Surgery saw patient and she was released. Admitted 04/21/18 due to COPD/HF exacerbation. Initially needed IV lasix and then transitioned to oral diuretics along with prednisone. Pulmonology, vascular, cardiology, surgery, GI, oncology and palliative care consults were all obtained. Head CT done due to acute encephalopathy which was negative. Needed TPN due to severe malnutrition and then advanced to a soft diet. Had ischemic colitis which required surgery (right hemicolectomy and an ileostomy). Discharged after 26 days.   She presents today for a follow-up visit with a chief complaint of minimal fatigue upon moderate exertion. She describes this as chronic in nature having been present for several years. She has associated headaches and difficulty sleeping along with this. She denies any dizziness, abdominal distention, palpitations, pedal edema, chest pain, shortness of breath, cough or weight gain.   Past Medical History:  Diagnosis Date  . Anxiety   . Arthritis   . Cancer ([place]) left    breast cancer 2000, chemo tx's with total mastectomy and lymph nodes resected.   . Cancer of right lung ([place])  02/21/2016   rad tx's.   . CHF (congestive heart failure) ([place])   . COPD (chronic obstructive pulmonary disease) ([place])   . Dependence on supplemental oxygen   . Depression   . Diabetes mellitus without complication ([place])   . Heart murmur   . Hypertension   . Lung nodule   . Lymphedema   . Shortness of breath dyspnea    with exertion  . Status post chemotherapy 2001   left breast cancer  . Status post radiation therapy 2001   left breast cancer   Past Surgical History:  Procedure Laterality Date  . Breast Biospy Left    [facility]  . BREAST SURGERY    . COLONOSCOPY N/A 04/30/2018   Procedure: COLONOSCOPY;  Surgeon: Virgel Manifold, MD;  Location: [facility] ENDOSCOPY;  Service: Endoscopy;  Laterality: N/A;  . COLONOSCOPY N/A 07/22/2018   Procedure: COLONOSCOPY;  Surgeon: Virgel Manifold, MD;  Location: [facility] ENDOSCOPY;  Service: Endoscopy;  Laterality: N/A;  . DILATION AND CURETTAGE OF UTERUS    . ELECTROMAGNETIC NAVIGATION BROCHOSCOPY Right 04/11/2016   Procedure: ELECTROMAGNETIC NAVIGATION BRONCHOSCOPY;  Surgeon: Vilinda Boehringer, MD;  Location: [facility] ORS;  Service: Cardiopulmonary;  Laterality: Right;  . ESOPHAGOGASTRODUODENOSCOPY N/A 07/22/2018   Procedure: ESOPHAGOGASTRODUODENOSCOPY (EGD);  Surgeon: Virgel Manifold, MD;  Location[facility] END[facility]COPY;  Service: Endoscopy;  Laterality: N/A;  . ESOPHAGOGASTRODUODENOSCOPY (EGD) WITH PROPOFOL N/A 05/07/2018   Procedure: ESOPHAGOGASTRODUODENOSCOPY (EGD) WITH PROPOFOL;  Surgeon: Lucilla Lame, MD;  Location[facility] ENDOSCOPY;  Service: Endoscopy;  Laterality: N/A;  . history of colonoscopy]    .  ILEOSCOPY N/A 07/22/2018   Procedure: ILEOSCOPY THROUGH STOMA;  Surgeon: Virgel Manifold, MD;  Location: [facility] ENDOSCOPY;  Service: Endoscopy;  Laterality: N/A;  . ILEOSTOMY    . ILEOSTOMY N/A 09/08/2018   Procedure: ILEOSTOMY REVISION POSSIBLE CREATION;  Surgeon: Herbert Pun, MD;  Location: [facility] ORS;  Service: General;  Laterality: N/A;  .  ILEOSTOMY CLOSURE N/A 08/15/2018   Procedure: DILATION OF ILEOSTOMY STRICTURE;  Surgeon: Herbert Pun, MD;  Location: [facility] ORS;  Service: General;  Laterality: N/A;  . LAPAROTOMY Right 05/04/2018   Procedure: EXPLORATORY LAPAROTOMY right colectomy right and left ostomy;  Surgeon: Herbert Pun, MD;  Location: [facility] ORS;  Service: General;  Laterality: Right;  . LUNG BIOPSY    . MASTECTOMY Left    2000, [facility]  . ROTATOR CUFF REPAIR Right    [facility]   Family History  Problem Relation Age of Onset  . Breast cancer Mother 69  . Cancer Mother        Breast   . Cirrhosis Father   . Breast cancer Paternal Aunt 72  . Cancer Maternal Aunt        Breast    Social History   Tobacco Use  . Smoking status: Former Smoker    Packs/day: 0.50    Years: 20.00    Pack years: 10.00    Types: Cigarettes    Last attempt to quit: 07/02/2012    Years since quitting: 6.3  . Smokeless tobacco: Never Used  . Tobacco comment: quit 2014  Substance Use Topics  . Alcohol use: Not Currently    Comment: Occasionally   No Known Allergies  Prior to Admission medications   Medication Sig Start Date End Date Taking? Authorizing Provider  albuterol (PROVENTIL HFA;VENTOLIN HFA) 108 (90 Base) MCG/ACT inhaler Inhale 2 puffs into the lungs every 6 (six) hours as needed for wheezing or shortness of breath. 04/24/18  Yes Demetrios Loll, MD  apixaban (ELIQUIS) 5 MG TABS tablet Take 1 tablet (5 mg total) by mouth 2 (two) times daily. 06/23/18  Yes [name], MD  budesonide-formoterol (SYMBICORT) 160-4.5 MCG/ACT inhaler Inhale 2 puffs into the lungs 2 (two) times daily. 04/24/18  Yes Demetrios Loll, MD  carvedilol (COREG) 6.25 MG tablet Take 1 tablet (6.25 mg total) by mouth 2 (two) times daily. 06/24/18  Yes ,  A, FNP  citalopram (CELEXA) 40 MG tablet Take 40 mg by mouth daily.   Yes [provider]  ferrous sulfate 325 (65 FE) MG tablet Take 1 tablet (325 mg total) by mouth 2 (two) times  daily with a meal. 07/02/18  Yes [name], MD  furosemide (LASIX) 40 MG tablet Take 40 mg by mouth 2 (two) times daily.    Yes [provider]  HYDROcodone-acetaminophen (NORCO/VICODIN) 5-325 MG tablet Take 1 tablet by mouth every 6 (six) hours as needed for moderate pain or severe pain.   Yes [provider]  loperamide (IMODIUM A-D) 2 MG tablet Take 1 tablet (2 mg total) by mouth as needed for diarrhea or loose stools. 10/04/18  Yes Herbert Pun, MD  megestrol (MEGACE) 400 MG/10ML suspension Take 7.5 mLs (300 mg total) by mouth 2 (two) times daily. 06/05/18  Yes Fritzi Mandes, MD  Multiple Vitamin (MULTIVITAMIN WITH MINERALS) TABS tablet Take 1 tablet by mouth daily. 06/06/18  Yes Fritzi Mandes, MD  pantoprazole (PROTONIX) 40 MG tablet Take 1 tablet (40 mg total) by mouth 2 (two) times daily before a meal. 07/23/18  Yes Hillary Bow, MD  potassium chloride (K-DUR) 10 MEQ tablet Take 10 mEq by mouth daily.   Yes [provider]  traMADol (ULTRAM) 50 MG tablet Take 50 mg by mouth every 6 (six) hours as needed. for pain 07/28/18  Yes [provider]    Review of Systems  Constitutional: Positive for fatigue. Negative for appetite change.  HENT: Positive for rhinorrhea. Negative for congestion and sore throat.   Eyes: Negative.   Respiratory: Negative for cough, chest tightness and shortness of breath.   Cardiovascular: Negative for chest pain, palpitations and leg swelling.  Gastrointestinal: Negative for abdominal distention and abdominal pain.  Endocrine: Negative.   Genitourinary: Negative.   Musculoskeletal: Negative for back pain and neck pain.  Skin: Negative.   Allergic/Immunologic: Negative.   Neurological: Positive for headaches. Negative for dizziness and light-headedness.  Hematological: Negative for adenopathy. Does not bruise/bleed easily.  Psychiatric/Behavioral: Positive for sleep disturbance (trouble falling asleep; sleeping  on 2 pillows). Negative for dysphoric mood. The patient is not nervous/anxious.    Vitals:   11/17/18 1002  BP: 111/80  Pulse: 79  Resp: 18  SpO2: 100%  Weight: 129 lb 8 oz (58.7 kg)  Height: 5\' 7"  (1.702 m)   Wt Readings from Last 3 Encounters:  11/17/18 129 lb 8 oz (58.7 kg)  10/22/18 128 lb 8 oz (58.3 kg)  09/28/18 126 lb 12.2 oz (57.5 kg)   Lab Results  Component Value Date   CREATININE 1.53 (H) 10/22/2018   CREATININE 1.22 (H) 10/04/2018   CREATININE 1.60 (H) 09/22/2018    Physical Exam Vitals signs and nursing note reviewed.  Constitutional:      Appearance: She is well-developed.  HENT:     Head: Normocephalic and atraumatic.  Neck:     Musculoskeletal: Normal range of motion and neck supple.     Vascular: No JVD.  Cardiovascular:     Rate and Rhythm: Normal rate and regular rhythm.  Pulmonary:     Effort: Pulmonary effort is normal. No respiratory distress.     Breath sounds: No wheezing or rales.  Abdominal:     General: There is no distension.     Palpations: Abdomen is soft.  Musculoskeletal:     Right lower leg: She exhibits no tenderness. No edema.     Left lower leg: She exhibits no tenderness. No edema.  Skin:    General: Skin is warm and dry.  Neurological:     Mental Status: She is alert and oriented to person, place, and time.  Psychiatric:        Behavior: Behavior normal.    Assessment & Plan:  1: Chronic heart failure with reduced ejection fraction- - NYHA class II - euvolemic today - weighing daily and she was reminded to call for an overnight weight gain of >2 pounds or a weekly weight gain of >5 pounds - weight stable from last time she was here ~ 1 month ago - wearing oxygen at 2L as needed - saw cardiology [name]) 10/23/17 - consider entresto use if renal function stabilizes - BNP 09/04/18 was 182.0 - PharmD reconciled medications with the patient - paramedic touched base with patient and patient says that she may be moving  back to [facility] this weekend - patient says that she has received her flu and pneumonia vaccines for this season  2: HTN- - BP looks good although on the low side so will not titrate carvedilol today - sees PCP (@ [facility]) - BMP 10/22/2018 reviewed and  showed sodium 138, potassium 3.8, creatinine 1.53 and GFR 41   Medication bottles were reviewed.   Return in 3 months or sooner for any questions/problems before then.

## 2018-11-17 ENCOUNTER — Encounter: Payer: Self-pay | Admitting: Pharmacist

## 2018-11-17 ENCOUNTER — Ambulatory Visit: Payer: Medicaid Other | Attending: Family | Admitting: Family

## 2018-11-17 ENCOUNTER — Encounter: Payer: Self-pay | Admitting: Family

## 2018-11-17 VITALS — BP 111/80 | HR 79 | Resp 18 | Ht 67.0 in | Wt 129.5 lb

## 2018-11-17 DIAGNOSIS — Z79899 Other long term (current) drug therapy: Secondary | ICD-10-CM | POA: Insufficient documentation

## 2018-11-17 DIAGNOSIS — J449 Chronic obstructive pulmonary disease, unspecified: Secondary | ICD-10-CM | POA: Insufficient documentation

## 2018-11-17 DIAGNOSIS — I110 Hypertensive heart disease with heart failure: Secondary | ICD-10-CM | POA: Insufficient documentation

## 2018-11-17 DIAGNOSIS — Z853 Personal history of malignant neoplasm of breast: Secondary | ICD-10-CM | POA: Diagnosis not present

## 2018-11-17 DIAGNOSIS — E119 Type 2 diabetes mellitus without complications: Secondary | ICD-10-CM | POA: Insufficient documentation

## 2018-11-17 DIAGNOSIS — I890 Lymphedema, not elsewhere classified: Secondary | ICD-10-CM | POA: Diagnosis not present

## 2018-11-17 DIAGNOSIS — I5022 Chronic systolic (congestive) heart failure: Secondary | ICD-10-CM | POA: Diagnosis present

## 2018-11-17 DIAGNOSIS — Z9981 Dependence on supplemental oxygen: Secondary | ICD-10-CM | POA: Diagnosis not present

## 2018-11-17 DIAGNOSIS — Z9049 Acquired absence of other specified parts of digestive tract: Secondary | ICD-10-CM | POA: Insufficient documentation

## 2018-11-17 DIAGNOSIS — Z7901 Long term (current) use of anticoagulants: Secondary | ICD-10-CM | POA: Diagnosis not present

## 2018-11-17 DIAGNOSIS — Z87891 Personal history of nicotine dependence: Secondary | ICD-10-CM | POA: Diagnosis not present

## 2018-11-17 DIAGNOSIS — Z9221 Personal history of antineoplastic chemotherapy: Secondary | ICD-10-CM | POA: Insufficient documentation

## 2018-11-17 DIAGNOSIS — I10 Essential (primary) hypertension: Secondary | ICD-10-CM

## 2018-11-17 DIAGNOSIS — Z85118 Personal history of other malignant neoplasm of bronchus and lung: Secondary | ICD-10-CM | POA: Insufficient documentation

## 2018-11-17 DIAGNOSIS — Z9013 Acquired absence of bilateral breasts and nipples: Secondary | ICD-10-CM | POA: Diagnosis not present

## 2018-11-17 NOTE — Progress Notes (Signed)
[facility] - HEART FAILURE CLINIC - PHARMACIST COUNSELING NOTE   ASSESSMENT   Danielle [name] is a 83 yof who presents to the heart failure clinic for routine follow up. She is feeling generally well and is without complaint.   Adherence assessment   Patient is using a pill box as an adherence strategy. It is still working well for her.    Do you ever forget to take your medication? [] Yes (1) [x] No (0)  Do you ever skip doses due to side effects? [] Yes (1) [x] No (0)  Do you have trouble affording your medicines? [] Yes (1) [x] No (0)  Are you ever unable to pick up your medication due to transportation difficulties? [] Yes (1) [x] No (0)  Do you ever stop taking your medications because you don't believe they are helping? [] Yes (1) [x] No (0)  Total score _0______      Guideline-Directed Medical Therapy/Evidence Based Medicine   ACE/ARB/ARNI: No - renal function won't support ACE/ARB/ARNI at this point   Beta Blocker: Yes   Aldosterone Antagonist: No Diuretic: Yes   Drug related problem 1: Adherence. Last visit patient reported not weighing herself every day. Today she says she is weighing herself at home and the weights have been stable around 125 to 127 lb. No changes of 2 to 3 pounds overnight or 5 pounds in a week.    Drug related problem 2: Additional therapy needed. Carvedilol is not at goal. Her HR today is 79 and BP 111/80.    Drug related problem 3: Additional therapy needed. SCr from last time 1.53 mg/dl. Patient is not on ACE/ARB/ARNI   PLAN   Drug related problem 1: Patient is weighing herself every day and weights have been stable. Continue to monitor.    Drug related problem 2: Continue current dose and continue to monitor for opportunity to increase dose to goal.   Drug related problem 3: No additional therapy at this time. Will plan to recheck BMP in several months.    SUBJECTIVE   HPI: Danielle [name] presents to heart failure clinic for routine  follow up. She has been feeling well with no SOB/swelling/weight gain.    Past Medical History:  Diagnosis Date  . Anxiety   . Arthritis   . Cancer ([place]) left    breast cancer 2000, chemo tx's with total mastectomy and lymph nodes resected.   . Cancer of right lung ([place]) 02/21/2016   rad tx's.   . CHF (congestive heart failure) ([place])   . COPD (chronic obstructive pulmonary disease) ([place])   . Dependence on supplemental oxygen   . Depression   . Diabetes mellitus without complication ([place])   . Heart murmur   . Hypertension   . Lung nodule   . Lymphedema   . Shortness of breath dyspnea    with exertion  . Status post chemotherapy 2001   left breast cancer  . Status post radiation therapy 2001   left breast cancer      Current Outpatient Medications:  .  albuterol (PROVENTIL HFA;VENTOLIN HFA) 108 (90 Base) MCG/ACT inhaler, Inhale 2 puffs into the lungs every 6 (six) hours as needed for wheezing or shortness of breath., Disp: 1 Inhaler, Rfl: 2 .  apixaban (ELIQUIS) 5 MG TABS tablet, Take 1 tablet (5 mg total) by mouth 2 (two) times daily., Disp: 60 tablet, Rfl: 0 .  budesonide-formoterol (SYMBICORT) 160-4.5 MCG/ACT inhaler, Inhale 2 puffs into the lungs 2 (two) times daily., Disp: 1 Inhaler, Rfl: 12 .  carvedilol (COREG) 6.25 MG tablet, Take 1 tablet (6.25 mg total) by mouth 2 (two) times daily., Disp: 60 tablet, Rfl: 3 .  citalopram (CELEXA) 40 MG tablet, Take 40 mg by mouth daily., Disp: , Rfl:  .  ferrous sulfate 325 (65 FE) MG tablet, Take 1 tablet (325 mg total) by mouth 2 (two) times daily with a meal., Disp: 60 tablet, Rfl: 3 .  furosemide (LASIX) 40 MG tablet, Take 40 mg by mouth 2 (two) times daily. , Disp: , Rfl:  .  HYDROcodone-acetaminophen (NORCO/VICODIN) 5-325 MG tablet, Take 1 tablet by mouth every 6 (six) hours as needed for moderate pain or severe pain., Disp: , Rfl:  .  loperamide (IMODIUM A-D) 2 MG tablet, Take 1 tablet (2 mg total) by mouth as needed for diarrhea or  loose stools., Disp: 30 tablet, Rfl: 0 .  megestrol (MEGACE) 400 MG/10ML suspension, Take 7.5 mLs (300 mg total) by mouth 2 (two) times daily., Disp: 240 mL, Rfl: 0 .  Multiple Vitamin (MULTIVITAMIN WITH MINERALS) TABS tablet, Take 1 tablet by mouth daily., Disp: 30 tablet, Rfl: 1 .  pantoprazole (PROTONIX) 40 MG tablet, Take 1 tablet (40 mg total) by mouth 2 (two) times daily before a meal., Disp: 60 tablet, Rfl: 0 .  potassium chloride (K-DUR) 10 MEQ tablet, Take 10 mEq by mouth daily., Disp: , Rfl:  .  traMADol (ULTRAM) 50 MG tablet, Take 50 mg by mouth every 6 (six) hours as needed. for pain, Disp: , Rfl: 0    OBJECTIVE    BMP Latest Ref Rng & Units 10/22/2018 10/04/2018 09/22/2018  Glucose 70 - 99 mg/dL 83 - 95  BUN 8 - 23 mg/dL 17 - 40(H)  Creatinine 0.44 - 1.00 mg/dL 1.53(H) 1.22(H) 1.60(H)  Sodium 135 - 145 mmol/L 138 - 135  Potassium 3.5 - 5.1 mmol/L 3.8 - 4.0  Chloride 98 - 111 mmol/L 112(H) - 113(H)  CO2 22 - 32 mmol/L 17(L) - 15(L)  Calcium 8.9 - 10.3 mg/dL 9.7 - 9.6    Vital signs: HR 79, BP 111/80, weight (pounds) 129.8  ECHO: Date 04/21/2018, EF 35 to 40%, mild stenosis, trivial regurgitation  Cath: Date N/A, EF N/A   DRUGS TO AVOID IN HEART FAILURE  Drug or Class Mechanism  Analgesics . NSAIDs . COX-2 inhibitors . Glucocorticoids  Sodium and water retention, increased systemic vascular resistance, decreased response to diuretics   Diabetes Medications . Metformin . Thiazolidinediones o Rosiglitazone (Avandia) o Pioglitazone (Actos) . DPP4 Inhibitors o Saxagliptin (Onglyza) o Sitagliptin (Januvia)   Lactic acidosis Possible calcium channel blockade   Unknown  Antiarrhythmics . Class I  o Flecainide o Disopyramide . Class III o Sotalol . Other o Dronedarone  Negative inotrope, proarrhythmic   Proarrhythmic, beta blockade  Negative inotrope  Antihypertensives . Alpha Blockers o Doxazosin . Calcium Channel  Blockers o Diltiazem o Verapamil o Nifedipine . Central Alpha Adrenergics o Moxonidine . Peripheral Vasodilators o Minoxidil  Increases renin and aldosterone  Negative inotrope    Possible sympathetic withdrawal  Unknown  Anti-infective . Itraconazole . Amphotericin B  Negative inotrope Unknown  Hematologic . Anagrelide . Cilostazol   Possible inhibition of PD IV Inhibition of PD III causing arrhythmias  Neurologic/Psychiatric . Stimulants . Anti-Seizure Drugs o Carbamazepine o Pregabalin . Antidepressants o Tricyclics o Citalopram . Parkinsons o Bromocriptine o Pergolide o Pramipexole . Antipsychotics o Clozapine . Antimigraine o Ergotamine o Methysergide . Appetite suppressants . Bipolar o Lithium  Peripheral alpha and beta agonist  activity  Negative inotrope and chronotrope Calcium channel blockade  Negative inotrope, proarrhythmic Dose-dependent QT prolongation  Excessive serotonin activity/valvular damage Excessive serotonin activity/valvular damage Unknown  IgE mediated hypersensitivy, calcium channel blockade  Excessive serotonin activity/valvular damage Excessive serotonin activity/valvular damage Valvular damage  Direct myofibrillar degeneration, adrenergic stimulation  Antimalarials . Chloroquine . Hydroxychloroquine Intracellular inhibition of lysosomal enzymes  Urologic Agents . Alpha Blockers o Doxazosin o Prazosin o Tamsulosin o Terazosin  Increased renin and aldosterone  Adapted from Page RL, et al. "Drugs That May Cause or Exacerbate Heart Failure: A Scientific Statement from the [employer]." Circulation 2016; 830:N40-H68. DOI: 10.1161/CIR.0000000000000426   COUNSELING POINTS/CLINICAL PEARLS  Carvedilol (Goal: weight less than 85 kg is 25 mg BID, weight greater than 85 kg is 50 mg BID)  Patient should avoid activities requiring coordination until drug effects are realized, as drug may cause dizziness.   This drug may cause diarrhea, nausea, vomiting, arthralgia, back pain, myalgia, headache, vision disorder, erectile dysfunction, reduced libido, or fatigue.  Instruct patient to report signs/symptoms of adverse cardiovascular effects such as hypotension (especially in elderly patients), arrhythmias, syncope, palpitations, angina, or edema.  Drug may mask symptoms of hypoglycemia. Advise diabetic patients to carefully monitor blood sugar levels.  Patient should take drug with food.  Advise patient against sudden discontinuation of drug.  Furosemide  Drug causes sun-sensitivity. Advise patient to use sunscreen and avoid tanning beds. Patient should avoid activities requiring coordination until drug effects are realized, as drug may cause dizziness, vertigo, or blurred vision. This drug may cause hyperglycemia, hyperuricemia, constipation, diarrhea, loss of appetite, nausea, vomiting, purpuric disorder, cramps, spasticity, asthenia, headache, paresthesia, or scaling eczema. Instruct patient to report unusual bleeding/bruising or signs/symptoms of hypotension, infection, pancreatitis, or ototoxicity (tinnitus, hearing impairment). Advise patient to report signs/symptoms of a severe skin reactions (flu-like symptoms, spreading red rash, or skin/mucous membrane blistering) or erythema multiforme. Instruct patient to eat high-potassium foods during drug therapy, as directed by healthcare professional.  Patient should not drink alcohol while taking this drug.   MEDICATION ADHERENCES TIPS AND STRATEGIES 1. Taking medication as prescribed improves patient outcomes in heart failure (reduces hospitalizations, improves symptoms, increases survival) 2. Side effects of medications can be managed by decreasing doses, switching agents, stopping drugs, or adding additional therapy. Please let someone in the [facility] know if you have having bothersome side effects so we can modify your regimen. Do not  alter your medication regimen without talking to [place].  3. Medication reminders can help patients remember to take drugs on time. If you are missing or forgetting doses you can try linking behaviors, using pill boxes, or an electronic reminder like an alarm on your phone or an app. Some people can also get automated phone calls as medication reminders.   Time spent: 10 minutes  Laural Benes, Pharm.D. Clinical Pharmacist 11/17/2018 9:58 AM

## 2018-11-17 NOTE — Patient Instructions (Signed)
Continue weighing daily and call for an overnight weight gain of > 2 pounds or a weekly weight gain of >5 pounds. 

## 2018-11-19 ENCOUNTER — Ambulatory Visit
Admission: RE | Admit: 2018-11-19 | Discharge: 2018-11-19 | Disposition: A | Discharge status: Home / Self Care | Payer: Medicaid Other | Source: Ambulatory Visit | Attending: Internal Medicine | Admitting: Internal Medicine

## 2018-11-19 DIAGNOSIS — C3411 Malignant neoplasm of upper lobe, right bronchus or lung: Secondary | ICD-10-CM | POA: Insufficient documentation

## 2018-11-26 ENCOUNTER — Ambulatory Visit
Admission: RE | Admit: 2018-11-26 | Discharge: 2018-11-26 | Disposition: A | Discharge status: Home / Self Care | Payer: Medicaid Other | Source: Ambulatory Visit | Attending: Nurse Practitioner | Admitting: Nurse Practitioner

## 2018-11-26 DIAGNOSIS — Z1231 Encounter for screening mammogram for malignant neoplasm of breast: Secondary | ICD-10-CM | POA: Diagnosis present

## 2018-11-26 HISTORY — DX: Malignant neoplasm of unspecified site of unspecified female breast: C50.919

## 2018-11-26 HISTORY — DX: Personal history of irradiation: Z92.3

## 2018-11-26 HISTORY — DX: Personal history of antineoplastic chemotherapy: Z92.21

## 2018-11-27 ENCOUNTER — Encounter: Payer: Self-pay | Admitting: Radiation Oncology

## 2018-11-27 ENCOUNTER — Other Ambulatory Visit: Payer: Self-pay

## 2018-11-27 ENCOUNTER — Ambulatory Visit
Admission: RE | Admit: 2018-11-27 | Discharge: 2018-11-27 | Disposition: A | Discharge status: Home / Self Care | Payer: Medicaid Other | Source: Ambulatory Visit | Attending: Radiation Oncology | Admitting: Radiation Oncology

## 2018-11-27 VITALS — BP 123/80 | HR 89 | Temp 98.0°F | Resp 18 | Wt 128.3 lb

## 2018-11-27 DIAGNOSIS — Z87891 Personal history of nicotine dependence: Secondary | ICD-10-CM | POA: Insufficient documentation

## 2018-11-27 DIAGNOSIS — C3411 Malignant neoplasm of upper lobe, right bronchus or lung: Secondary | ICD-10-CM | POA: Insufficient documentation

## 2018-11-27 DIAGNOSIS — Z923 Personal history of irradiation: Secondary | ICD-10-CM | POA: Insufficient documentation

## 2018-11-27 NOTE — Progress Notes (Signed)
Radiation Oncology Follow up Note  Name: Danielle [name]   Date:   11/27/2018 MRN:  [id number] DOB: 02/20/1956    This 63 y.o. female presents to the clinic today for to year follow-up status post SB RT for a second time to her right upper lobe non-small cell lung cancer.  REFERRING PROVIDER: Elisabeth Cara, NP  HPI: patient is a 64 year old female now seen out 2 years following SB RT.for non-small cell lung cancer of the right upper lobe. She previously received radiation therapy for invasive mammary carcinoma to her chest wall peripheral lymphatics. This was her second SB RT treatment she is seen today in routine follow-up is doing fairly well she states she has some shortness of breath and dyspnea on exertion. Slight nonproductive cough no hemoptysis.she had a CT scan yesterday showing evolution of post radiation changes in right parahilar lung without any progressive findings.  COMPLICATIONS OF TREATMENT: none  FOLLOW UP COMPLIANCE: keeps appointments   PHYSICAL EXAM:  BP 123/80   Pulse 89   Temp 98 F (36.7 C)   Resp 18   Wt 128 lb 4.9 oz (58.2 kg)   BMI 20.10 kg/m  Well-developed well-nourished patient in NAD. HEENT reveals PERLA, EOMI, discs not visualized.  Oral cavity is clear. No oral mucosal lesions are identified. Neck is clear without evidence of cervical or supraclavicular adenopathy. Lungs are clear to A&P. Cardiac examination is essentially unremarkable with regular rate and rhythm without murmur rub or thrill. Abdomen is benign with no organomegaly or masses noted. Motor sensory and DTR levels are equal and symmetric in the upper and lower extremities. Cranial nerves II through XII are grossly intact. Proprioception is intact. No peripheral adenopathy or edema is identified. No motor or sensory levels are noted. Crude visual fields are within normal range.  RADIOLOGY RESULTS: CT scans reviewed and compatible with the above-stated findings  PLAN: present time patient is  doing well under excellent control by CT criteria. I'm please were overall progress. I've asked to see her back in 1 year for follow-up. Medical oncology's be ordering her CT scans will follow that accordingly. Patient is to call at anytime with any concerns.  I would like to take this opportunity to thank you for allowing me to participate in the care of your patient.Noreene Filbert, MD

## 2018-12-19 ENCOUNTER — Other Ambulatory Visit: Payer: Self-pay | Admitting: *Deleted

## 2018-12-19 DIAGNOSIS — C3411 Malignant neoplasm of upper lobe, right bronchus or lung: Secondary | ICD-10-CM

## 2018-12-22 ENCOUNTER — Inpatient Hospital Stay: Payer: Medicaid Other

## 2018-12-22 ENCOUNTER — Encounter: Payer: Self-pay | Admitting: Emergency Medicine

## 2018-12-22 ENCOUNTER — Other Ambulatory Visit: Payer: Self-pay

## 2018-12-22 ENCOUNTER — Emergency Department: Payer: Medicaid Other

## 2018-12-22 ENCOUNTER — Inpatient Hospital Stay: Payer: Medicaid Other | Admitting: Internal Medicine

## 2018-12-22 ENCOUNTER — Inpatient Hospital Stay
Admission: EM | Admit: 2018-12-22 | Discharge: 2018-12-24 | DRG: 189 | Disposition: A | Discharge status: Home / Self Care | Payer: Medicaid Other | Attending: Internal Medicine | Admitting: Internal Medicine

## 2018-12-22 DIAGNOSIS — Z853 Personal history of malignant neoplasm of breast: Secondary | ICD-10-CM

## 2018-12-22 DIAGNOSIS — E872 Acidosis: Secondary | ICD-10-CM | POA: Diagnosis present

## 2018-12-22 DIAGNOSIS — J9621 Acute and chronic respiratory failure with hypoxia: Secondary | ICD-10-CM | POA: Diagnosis present

## 2018-12-22 DIAGNOSIS — I130 Hypertensive heart and chronic kidney disease with heart failure and stage 1 through stage 4 chronic kidney disease, or unspecified chronic kidney disease: Secondary | ICD-10-CM | POA: Diagnosis present

## 2018-12-22 DIAGNOSIS — Z7901 Long term (current) use of anticoagulants: Secondary | ICD-10-CM | POA: Diagnosis not present

## 2018-12-22 DIAGNOSIS — Z79899 Other long term (current) drug therapy: Secondary | ICD-10-CM | POA: Diagnosis not present

## 2018-12-22 DIAGNOSIS — I2510 Atherosclerotic heart disease of native coronary artery without angina pectoris: Secondary | ICD-10-CM | POA: Diagnosis present

## 2018-12-22 DIAGNOSIS — Z9012 Acquired absence of left breast and nipple: Secondary | ICD-10-CM

## 2018-12-22 DIAGNOSIS — R059 Cough, unspecified: Secondary | ICD-10-CM

## 2018-12-22 DIAGNOSIS — Z7951 Long term (current) use of inhaled steroids: Secondary | ICD-10-CM | POA: Diagnosis not present

## 2018-12-22 DIAGNOSIS — J9622 Acute and chronic respiratory failure with hypercapnia: Secondary | ICD-10-CM

## 2018-12-22 DIAGNOSIS — J441 Chronic obstructive pulmonary disease with (acute) exacerbation: Secondary | ICD-10-CM | POA: Diagnosis present

## 2018-12-22 DIAGNOSIS — Z9221 Personal history of antineoplastic chemotherapy: Secondary | ICD-10-CM

## 2018-12-22 DIAGNOSIS — R05 Cough: Secondary | ICD-10-CM

## 2018-12-22 DIAGNOSIS — F329 Major depressive disorder, single episode, unspecified: Secondary | ICD-10-CM | POA: Diagnosis present

## 2018-12-22 DIAGNOSIS — Z923 Personal history of irradiation: Secondary | ICD-10-CM | POA: Diagnosis not present

## 2018-12-22 DIAGNOSIS — E1122 Type 2 diabetes mellitus with diabetic chronic kidney disease: Secondary | ICD-10-CM | POA: Diagnosis present

## 2018-12-22 DIAGNOSIS — F419 Anxiety disorder, unspecified: Secondary | ICD-10-CM | POA: Diagnosis present

## 2018-12-22 DIAGNOSIS — Z85118 Personal history of other malignant neoplasm of bronchus and lung: Secondary | ICD-10-CM | POA: Diagnosis not present

## 2018-12-22 DIAGNOSIS — M1990 Unspecified osteoarthritis, unspecified site: Secondary | ICD-10-CM | POA: Diagnosis present

## 2018-12-22 DIAGNOSIS — R1310 Dysphagia, unspecified: Secondary | ICD-10-CM | POA: Diagnosis present

## 2018-12-22 DIAGNOSIS — N179 Acute kidney failure, unspecified: Secondary | ICD-10-CM | POA: Diagnosis present

## 2018-12-22 DIAGNOSIS — R0602 Shortness of breath: Secondary | ICD-10-CM

## 2018-12-22 DIAGNOSIS — Z79891 Long term (current) use of opiate analgesic: Secondary | ICD-10-CM

## 2018-12-22 DIAGNOSIS — F17290 Nicotine dependence, other tobacco product, uncomplicated: Secondary | ICD-10-CM | POA: Diagnosis present

## 2018-12-22 DIAGNOSIS — Z9981 Dependence on supplemental oxygen: Secondary | ICD-10-CM | POA: Diagnosis not present

## 2018-12-22 DIAGNOSIS — Z803 Family history of malignant neoplasm of breast: Secondary | ICD-10-CM | POA: Diagnosis not present

## 2018-12-22 DIAGNOSIS — I5022 Chronic systolic (congestive) heart failure: Secondary | ICD-10-CM | POA: Diagnosis present

## 2018-12-22 DIAGNOSIS — J9601 Acute respiratory failure with hypoxia: Secondary | ICD-10-CM

## 2018-12-22 DIAGNOSIS — N189 Chronic kidney disease, unspecified: Secondary | ICD-10-CM | POA: Diagnosis present

## 2018-12-22 DIAGNOSIS — R011 Cardiac murmur, unspecified: Secondary | ICD-10-CM | POA: Diagnosis present

## 2018-12-22 LAB — CBC
HCT: 34.4 % — ABNORMAL LOW (ref 36.0–46.0)
Hemoglobin: 10.9 g/dL — ABNORMAL LOW (ref 12.0–15.0)
MCH: 29.8 pg (ref 26.0–34.0)
MCHC: 31.7 g/dL (ref 30.0–36.0)
MCV: 94 fL (ref 80.0–100.0)
Platelets: 310 K/uL (ref 150–400)
RBC: 3.66 MIL/uL — ABNORMAL LOW (ref 3.87–5.11)
RDW: 12.9 % (ref 11.5–15.5)
WBC: 9.7 K/uL (ref 4.0–10.5)
nRBC: 0 % (ref 0.0–0.2)

## 2018-12-22 LAB — BASIC METABOLIC PANEL
Anion gap: 7 (ref 5–15)
BUN: 37 mg/dL — ABNORMAL HIGH (ref 8–23)
CO2: 18 mmol/L — ABNORMAL LOW (ref 22–32)
CREATININE: 2.54 mg/dL — AB (ref 0.44–1.00)
Calcium: 9.5 mg/dL (ref 8.9–10.3)
Chloride: 112 mmol/L — ABNORMAL HIGH (ref 98–111)
GFR calc non Af Amer: 19 mL/min — ABNORMAL LOW (ref >60)
GFR, EST AFRICAN AMERICAN: 22 mL/min — AB (ref >60)
Glucose, Bld: 107 mg/dL — ABNORMAL HIGH (ref 70–99)
Potassium: 3.8 mmol/L (ref 3.5–5.1)
Sodium: 137 mmol/L (ref 135–145)

## 2018-12-22 LAB — TSH: TSH: 0.601 u[IU]/mL (ref 0.350–4.500)

## 2018-12-22 LAB — BLOOD GAS, VENOUS
Acid-base deficit: 11.3 mmol/L — ABNORMAL HIGH (ref 0.0–2.0)
Bicarbonate: 16.8 mmol/L — ABNORMAL LOW (ref 20.0–28.0)
FIO2: 0.28
O2 Saturation: 59.6 %
Patient temperature: 37
pCO2, Ven: 45 mmHg (ref 44.0–60.0)
pH, Ven: 7.18 — CL (ref 7.250–7.430)
pO2, Ven: 40 mmHg (ref 32.0–45.0)

## 2018-12-22 LAB — BRAIN NATRIURETIC PEPTIDE: B Natriuretic Peptide: 80 pg/mL (ref 0.0–100.0)

## 2018-12-22 LAB — LACTIC ACID, PLASMA: Lactic Acid, Venous: 0.6 mmol/L (ref 0.5–1.9)

## 2018-12-22 LAB — GLUCOSE, CAPILLARY: Glucose-Capillary: 114 mg/dL — ABNORMAL HIGH (ref 70–99)

## 2018-12-22 LAB — MRSA PCR SCREENING: MRSA by PCR: NEGATIVE

## 2018-12-22 LAB — TROPONIN I: Troponin I: <0.03 ng/mL (ref <0.03)

## 2018-12-22 MED ORDER — APIXABAN 2.5 MG PO TABS
2.5000 mg | ORAL_TABLET | Freq: Two times a day (BID) | ORAL | Status: DC
  Administered 2018-12-22 – 2018-12-24 (×4): 2.5 mg via ORAL
  Filled 2018-12-22 (×4): qty 1

## 2018-12-22 MED ORDER — GUAIFENESIN-DM 100-10 MG/5ML PO SYRP
5.0000 mL | ORAL_SOLUTION | ORAL | Status: DC | PRN
  Administered 2018-12-23: 5 mL via ORAL
  Filled 2018-12-22: qty 5

## 2018-12-22 MED ORDER — PANTOPRAZOLE SODIUM 40 MG PO TBEC
40.0000 mg | DELAYED_RELEASE_TABLET | Freq: Every day | ORAL | Status: DC
  Administered 2018-12-23 – 2018-12-24 (×2): 40 mg via ORAL
  Filled 2018-12-22 (×2): qty 1

## 2018-12-22 MED ORDER — SODIUM CHLORIDE 0.9 % IV SOLN
500.0000 mg | INTRAVENOUS | Status: DC
  Administered 2018-12-23 – 2018-12-24 (×2): 500 mg via INTRAVENOUS
  Filled 2018-12-22 (×3): qty 500

## 2018-12-22 MED ORDER — METHYLPREDNISOLONE SODIUM SUCC 125 MG IJ SOLR
INTRAMUSCULAR | Status: AC
  Administered 2018-12-22: 125 mg via INTRAVENOUS
  Filled 2018-12-22: qty 2

## 2018-12-22 MED ORDER — SODIUM CHLORIDE 0.9 % IV SOLN
500.0000 mg | Freq: Once | INTRAVENOUS | Status: AC
  Administered 2018-12-22: 500 mg via INTRAVENOUS
  Filled 2018-12-22: qty 500

## 2018-12-22 MED ORDER — PANTOPRAZOLE SODIUM 40 MG IV SOLR
40.0000 mg | Freq: Once | INTRAVENOUS | Status: AC
  Administered 2018-12-22: 40 mg via INTRAVENOUS
  Filled 2018-12-22: qty 40

## 2018-12-22 MED ORDER — PANTOPRAZOLE SODIUM 40 MG PO TBEC
40.0000 mg | DELAYED_RELEASE_TABLET | Freq: Two times a day (BID) | ORAL | Status: DC
  Administered 2018-12-22: 40 mg via ORAL
  Filled 2018-12-22: qty 1

## 2018-12-22 MED ORDER — ACETAMINOPHEN 650 MG RE SUPP: 650.0000 mg | Freq: Four times a day (QID) | RECTAL | Status: DC | PRN

## 2018-12-22 MED ORDER — ONDANSETRON HCL 4 MG PO TABS: 4.0000 mg | ORAL_TABLET | Freq: Four times a day (QID) | ORAL | Status: DC | PRN

## 2018-12-22 MED ORDER — IPRATROPIUM-ALBUTEROL 0.5-2.5 (3) MG/3ML IN SOLN
3.0000 mL | Freq: Once | RESPIRATORY_TRACT | Status: AC
  Administered 2018-12-22: 3 mL via RESPIRATORY_TRACT

## 2018-12-22 MED ORDER — PREDNISONE 50 MG PO TABS
50.0000 mg | ORAL_TABLET | Freq: Every day | ORAL | Status: DC
  Administered 2018-12-23 – 2018-12-24 (×2): 50 mg via ORAL
  Filled 2018-12-22 (×2): qty 1

## 2018-12-22 MED ORDER — DOCUSATE SODIUM 100 MG PO CAPS
100.0000 mg | ORAL_CAPSULE | Freq: Two times a day (BID) | ORAL | Status: DC
  Administered 2018-12-22 – 2018-12-24 (×4): 100 mg via ORAL
  Filled 2018-12-22 (×5): qty 1

## 2018-12-22 MED ORDER — ACETAMINOPHEN 325 MG PO TABS
650.0000 mg | ORAL_TABLET | Freq: Four times a day (QID) | ORAL | Status: DC | PRN
  Administered 2018-12-22: 650 mg via ORAL
  Filled 2018-12-22: qty 2

## 2018-12-22 MED ORDER — METHYLPREDNISOLONE SODIUM SUCC 125 MG IJ SOLR
125.0000 mg | Freq: Once | INTRAMUSCULAR | Status: AC
  Administered 2018-12-22: 125 mg via INTRAVENOUS

## 2018-12-22 MED ORDER — UMECLIDINIUM-VILANTEROL 62.5-25 MCG/INH IN AEPB
1.0000 | INHALATION_SPRAY | Freq: Every day | RESPIRATORY_TRACT | Status: DC
  Administered 2018-12-22: 1 via RESPIRATORY_TRACT
  Filled 2018-12-22: qty 14

## 2018-12-22 MED ORDER — IPRATROPIUM-ALBUTEROL 0.5-2.5 (3) MG/3ML IN SOLN
RESPIRATORY_TRACT | Status: AC
  Administered 2018-12-22: 3 mL via RESPIRATORY_TRACT
  Filled 2018-12-22: qty 3

## 2018-12-22 MED ORDER — APIXABAN 5 MG PO TABS
5.0000 mg | ORAL_TABLET | Freq: Two times a day (BID) | ORAL | Status: DC
  Administered 2018-12-22: 5 mg via ORAL
  Filled 2018-12-22: qty 1

## 2018-12-22 MED ORDER — CARVEDILOL 3.125 MG PO TABS
6.2500 mg | ORAL_TABLET | Freq: Two times a day (BID) | ORAL | Status: DC
  Administered 2018-12-22 – 2018-12-24 (×5): 6.25 mg via ORAL
  Filled 2018-12-22 (×3): qty 2
  Filled 2018-12-22: qty 1
  Filled 2018-12-22: qty 2

## 2018-12-22 MED ORDER — IPRATROPIUM-ALBUTEROL 0.5-2.5 (3) MG/3ML IN SOLN
3.0000 mL | Freq: Four times a day (QID) | RESPIRATORY_TRACT | Status: DC
  Administered 2018-12-22 – 2018-12-23 (×4): 3 mL via RESPIRATORY_TRACT
  Filled 2018-12-22 (×4): qty 3

## 2018-12-22 MED ORDER — SODIUM CHLORIDE 0.9 % IV SOLN
INTRAVENOUS | Status: DC
  Administered 2018-12-22: 10:00:00 via INTRAVENOUS

## 2018-12-22 MED ORDER — SODIUM CHLORIDE 0.9 % IV SOLN
1.0000 g | Freq: Once | INTRAVENOUS | Status: AC
  Administered 2018-12-22: 1 g via INTRAVENOUS
  Filled 2018-12-22: qty 10

## 2018-12-22 MED ORDER — ONDANSETRON HCL 4 MG/2ML IJ SOLN: 4.0000 mg | Freq: Four times a day (QID) | INTRAMUSCULAR | Status: DC | PRN

## 2018-12-22 MED ORDER — SODIUM CHLORIDE 0.9% FLUSH: 3.0000 mL | Freq: Once | INTRAVENOUS | Status: DC

## 2018-12-22 MED ORDER — ALUM & MAG HYDROXIDE-SIMETH 200-200-20 MG/5ML PO SUSP
30.0000 mL | Freq: Four times a day (QID) | ORAL | Status: DC | PRN
  Administered 2018-12-22 – 2018-12-23 (×2): 30 mL via ORAL
  Filled 2018-12-22 (×2): qty 30

## 2018-12-22 MED ORDER — BUDESONIDE 0.25 MG/2ML IN SUSP
0.2500 mg | Freq: Two times a day (BID) | RESPIRATORY_TRACT | Status: DC
  Administered 2018-12-22 – 2018-12-24 (×5): 0.25 mg via RESPIRATORY_TRACT
  Filled 2018-12-22 (×4): qty 2

## 2018-12-22 MED ORDER — ALBUTEROL SULFATE (2.5 MG/3ML) 0.083% IN NEBU
2.5000 mg | INHALATION_SOLUTION | RESPIRATORY_TRACT | Status: DC
  Filled 2018-12-22: qty 3

## 2018-12-22 NOTE — H&P (Signed)
Danielle [name] is an 63 y.o. female.   Chief Complaint: Shortness of breath HPI: The patient with past medical history of breast cancer and lung cancer status post radiation and chemotherapy; CHF and COPD presents to the emergency department complaining of shortness of breath.  The patient states that she began to have a severe cough 3 days ago with accompanying dyspnea that waxed and waned in severity.  She has been using her inhaler without relief.  Upon arrival the patient required BiPAP and was given Solu-Medrol prior to the emergency department staff calling the hospitalist service for admission.  Past Medical History:  Diagnosis Date  . Anxiety   . Arthritis   . Breast cancer ([place]) 2000  . Cancer ([place]) left    breast cancer 2000, chemo tx's with total mastectomy and lymph nodes resected.   . Cancer of right lung ([place]) 02/21/2016   rad tx's.   . CHF (congestive heart failure) ([place])   . COPD (chronic obstructive pulmonary disease) ([place])   . Dependence on supplemental oxygen   . Depression   . Diabetes mellitus without complication ([place])   . Heart murmur   . Hypertension   . Lung nodule   . Lymphedema   . Personal history of chemotherapy   . Personal history of radiation therapy   . Shortness of breath dyspnea    with exertion  . Status post chemotherapy 2001   left breast cancer  . Status post radiation therapy 2001   left breast cancer    Past Surgical History:  Procedure Laterality Date  . Breast Biospy Left    [facility]  . BREAST SURGERY    . COLONOSCOPY N/A 04/30/2018   Procedure: COLONOSCOPY;  Surgeon: Virgel Manifold, MD;  Location: [facility] ENDOSCOPY;  Service: Endoscopy;  Laterality: N/A;  . COLONOSCOPY N/A 07/22/2018   Procedure: COLONOSCOPY;  Surgeon: Virgel Manifold, MD;  Location: [facility] ENDOSCOPY;  Service: Endoscopy;  Laterality: N/A;  . DILATION AND CURETTAGE OF UTERUS    . ELECTROMAGNETIC NAVIGATION BROCHOSCOPY Right 04/11/2016   Procedure: ELECTROMAGNETIC  NAVIGATION BRONCHOSCOPY;  Surgeon: Vilinda Boehringer, MD;  Location: [facility] ORS;  Service: Cardiopulmonary;  Laterality: Right;  . ESOPHAGOGASTRODUODENOSCOPY N/A 07/22/2018   Procedure: ESOPHAGOGASTRODUODENOSCOPY (EGD);  Surgeon: Virgel Manifold, MD;  Location: [facility] ENDOSCOPY;  Service: Endoscopy;  Laterality: N/A;  . ESOPHAGOGASTRODUODENOSCOPY (EGD) WITH PROPOFOL N/A 05/07/2018   Procedure: ESOPHAGOGASTRODUODENOSCOPY (EGD) WITH PROPOFOL;  Surgeon: Lucilla Lame, MD;  Location: [facility] ENDOSCOPY;  Service: Endoscopy;  Laterality: N/A;  . history of colonoscopy]    . ILEOSCOPY N/A 07/22/2018   Procedure: ILEOSCOPY THROUGH STOMA;  Surgeon: Virgel Manifold, MD;  Location: [facility] ENDOSCOPY;  Service: Endoscopy;  Laterality: N/A;  . ILEOSTOMY    . ILEOSTOMY N/A 09/08/2018   Procedure: ILEOSTOMY REVISION POSSIBLE CREATION;  Surgeon: Herbert Pun, MD;  Location: [facility] ORS;  Service: General;  Laterality: N/A;  . ILEOSTOMY CLOSURE N/A 08/15/2018   Procedure: DILATION OF ILEOSTOMY STRICTURE;  Surgeon: Herbert Pun, MD;  Location: [facility] ORS;  Service: General;  Laterality: N/A;  . LAPAROTOMY Right 05/04/2018   Procedure: EXPLORATORY LAPAROTOMY right colectomy right and left ostomy;  Surgeon: Herbert Pun, MD;  Location: [facility] ORS;  Service: General;  Laterality: Right;  . LUNG BIOPSY    . MASTECTOMY Left    2000, [facility]  . ROTATOR CUFF REPAIR Right    [facility]    Family History  Problem Relation Age of Onset  . Breast cancer Mother 86  . Cancer Mother  Breast   . Cirrhosis Father   . Breast cancer Paternal Aunt 33  . Cancer Maternal Aunt        Breast    Social History:  reports that she quit smoking about 6 years ago. Her smoking use included cigarettes. She has a 10.00 pack-year smoking history. Her smokeless tobacco use includes snuff. She reports previous alcohol use. She reports that she does not use drugs.  Allergies: No Known Allergies  Medications Prior to Admission   Medication Sig Dispense Refill  . albuterol (PROVENTIL HFA;VENTOLIN HFA) 108 (90 Base) MCG/ACT inhaler Inhale 2 puffs into the lungs every 6 (six) hours as needed for wheezing or shortness of breath. 1 Inhaler 2  . apixaban (ELIQUIS) 5 MG TABS tablet Take 1 tablet (5 mg total) by mouth 2 (two) times daily. 60 tablet 0  . budesonide-formoterol (SYMBICORT) 160-4.5 MCG/ACT inhaler Inhale 2 puffs into the lungs 2 (two) times daily. 1 Inhaler 12  . carvedilol (COREG) 6.25 MG tablet Take 1 tablet (6.25 mg total) by mouth 2 (two) times daily. 60 tablet 3  . citalopram (CELEXA) 40 MG tablet Take 40 mg by mouth daily.    . ferrous sulfate 325 (65 FE) MG tablet Take 1 tablet (325 mg total) by mouth 2 (two) times daily with a meal. 60 tablet 3  . furosemide (LASIX) 40 MG tablet Take 40 mg by mouth 2 (two) times daily.     [name] HYDROcodone-acetaminophen (NORCO/VICODIN) 5-325 MG tablet Take 1 tablet by mouth every 6 (six) hours as needed for moderate pain or severe pain.    [name] loperamide (IMODIUM A-D) 2 MG tablet Take 1 tablet (2 mg total) by mouth as needed for diarrhea or loose stools. 30 tablet 0  . megestrol (MEGACE) 400 MG/10ML suspension Take 7.5 mLs (300 mg total) by mouth 2 (two) times daily. 240 mL 0  . Multiple Vitamin (MULTIVITAMIN WITH MINERALS) TABS tablet Take 1 tablet by mouth daily. 30 tablet 1  . pantoprazole (PROTONIX) 40 MG tablet Take 1 tablet (40 mg total) by mouth 2 (two) times daily before a meal. 60 tablet 0  . potassium chloride (K-DUR) 10 MEQ tablet Take 10 mEq by mouth daily.    . traMADol (ULTRAM) 50 MG tablet Take 50 mg by mouth every 6 (six) hours as needed. for pain  0    Results for orders placed or performed during the hospital encounter of 12/22/18 (from the past 48 hour(s))  Basic metabolic panel     Status: Abnormal   Collection Time: 12/22/18  3:47 AM  Result Value Ref Range   Sodium 137 135 - 145 mmol/L   Potassium 3.8 3.5 - 5.1 mmol/L   Chloride 112 (H) 98 - 111 mmol/L    CO2 18 (L) 22 - 32 mmol/L   Glucose, Bld 107 (H) 70 - 99 mg/dL   BUN 37 (H) 8 - 23 mg/dL   Creatinine, Ser 2.54 (H) 0.44 - 1.00 mg/dL   Calcium 9.5 8.9 - 10.3 mg/dL   GFR calc non Af Amer 19 (L) >60 mL/min   GFR calc Af Amer 22 (L) >60 mL/min   Anion gap 7 5 - 15    Comment: Performed at [facility], [address] [place], [place] [address]  CBC     Status: Abnormal   Collection Time: 12/22/18  3:47 AM  Result Value Ref Range   WBC 9.7 4.0 - 10.5 K/uL   RBC 3.66 (L) 3.87 - 5.11 MIL/uL  Hemoglobin 10.9 (L) 12.0 - 15.0 g/dL   HCT 34.4 (L) 36.0 - 46.0 %   MCV 94.0 80.0 - 100.0 fL   MCH 29.8 26.0 - 34.0 pg   MCHC 31.7 30.0 - 36.0 g/dL   RDW 12.9 11.5 - 15.5 %   Platelets 310 150 - 400 K/uL   nRBC 0.0 0.0 - 0.2 %    Comment: Performed at [facility], [address] [place], [place] [address]  Troponin I - ONCE - STAT     Status: None   Collection Time: 12/22/18  3:47 AM  Result Value Ref Range   Troponin I <0.03 <0.03 ng/mL    Comment: Performed at [facility], [address] [place], [place] [address]  Brain natriuretic peptide     Status: None   Collection Time: 12/22/18  3:47 AM  Result Value Ref Range   B Natriuretic Peptide 80.0 0.0 - 100.0 pg/mL    Comment: Performed at [facility], [address] [place], [place] [address]  Blood gas, venous     Status: Abnormal   Collection Time: 12/22/18  3:47 AM  Result Value Ref Range   FIO2 0.28    Delivery systems NASAL CANNULA    pH, Ven 7.18 (LL) 7.250 - 7.430    Comment: CRITICAL RESULT CALLED TO, READ BACK BY AND VERIFIED WITH: BROWN, MD AT 0356 ON03/06/2019    pCO2, Ven 45 44.0 - 60.0 mmHg   pO2, Ven 40.0 32.0 - 45.0 mmHg   Bicarbonate 16.8 (L) 20.0 - 28.0 mmol/L   Acid-base deficit 11.3 (H) 0.0 - 2.0 mmol/L   O2 Saturation 59.6 %   Patient temperature 37.0    Collection site VEIN    Sample type VENOUS     Comment: Performed at [facility], [address] [place], [place] [address]   Dg Chest Port 1 View  Result Date: 12/22/2018 CLINICAL DATA:  Productive cough and chest pain EXAM: PORTABLE CHEST 1 VIEW COMPARISON:  11/19/2018 chest CT FINDINGS: Streaky right perihilar density correlating with radiation fibrosis on prior chest CT. Generalized interstitial coarsening in this patient with COPD. There is no edema, consolidation, effusion, or pneumothorax. Normal heart size IMPRESSION: 1. COPD without acute superimposed finding. 2. 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   On: 12/22/2018 04:21    Review of Systems  Constitutional: Negative for chills and fever.  HENT: Negative for sore throat and tinnitus.   Eyes: Negative for blurred vision and redness.  Respiratory: Positive for cough and shortness of breath.   Cardiovascular: Negative for chest pain, palpitations, orthopnea and PND.  Gastrointestinal: Negative for abdominal pain, diarrhea, nausea and vomiting.  Genitourinary: Negative for dysuria, frequency and urgency.  Musculoskeletal: Negative for joint pain and myalgias.  Skin: Negative for rash.       No lesions  Neurological: Negative for speech change, focal weakness and weakness.  Endo/Heme/Allergies: Does not bruise/bleed easily.       No temperature intolerance  Psychiatric/Behavioral: Negative for depression and suicidal ideas.    Blood pressure 102/78, pulse 95, temperature 98.6 F (37 C), temperature source Oral, resp. rate (!) 21, height 5\' 7"  (1.702 m), weight 59 kg, SpO2 100 %. Physical Exam  Vitals reviewed. Constitutional: She is oriented to person, place, and time. She appears well-developed and well-nourished. No distress.  HENT:  Head: Normocephalic and atraumatic.  Mouth/Throat: Oropharynx is clear and moist.  Eyes: Pupils are equal, round, and reactive to light. Conjunctivae and EOM are normal. No scleral icterus.  Neck: Normal range of motion.  Neck supple. No JVD present. No tracheal deviation present. No thyromegaly present.  Cardiovascular: Normal rate, regular rhythm and normal heart sounds. Exam reveals no gallop and no friction rub.  No murmur heard. Respiratory: Effort normal. No respiratory distress. She has wheezes.  GI: Soft. Bowel sounds are normal. She exhibits no distension. There is no abdominal tenderness.  Genitourinary:    Genitourinary Comments: Deferred   Musculoskeletal: Normal range of motion.        General: No edema.  Lymphadenopathy:    She has no cervical adenopathy.  Neurological: She is alert and oriented to person, place, and time. No cranial nerve deficit. She exhibits normal muscle tone.  Skin: Skin is warm and dry. No rash noted. No erythema.  Psychiatric: She has a normal mood and affect. Her behavior is normal. Judgment and thought content normal.     Assessment/Plan This is a 63 year old female admitted for respiratory failure. 1.  Respiratory failure: Acute on chronic; with hypoxemia.  Continue BiPAP.  Also continue prednisone daily.  Azithromycin for anti-inflammatory effect. 2.  COPD: With exacerbation; continue anticholinergic agent.  I have added long-acting bronchial agonist.  Albuterol scheduled while hospitalized. 3.  Hypertension: Controlled; continue carvedilol 4.  CHF: Chronic systolic; last EF 35 to 12%.  The patient is euvolemic at this time.  Resume Lasix when kidney function is improved 5.  AKI: Superimposed on chronic kidney disease; hydrate with intravenous fluid.  Avoid nephrotoxic agents 6.  DVT prophylaxis: Lovenox 7.  GI prophylaxis Pantoprazole per home regimen The patient is a full code.  I have personally spent 45 minutes in critical care time with this patient  Harrie Foreman, MD 12/22/2018, 5:00 AM

## 2018-12-22 NOTE — ED Provider Notes (Signed)
[facility] Emergency Department Provider Note    First MD Initiated Contact with Patient 12/22/18 [phone]     (approximate)  I have reviewed the triage vital signs and the nursing notes.   HISTORY  Chief Complaint Shortness of Breath    HPI Danielle [name] is a 63 y.o. female with below list of chronic medical conditions including COPD and CHF presents to the emergency department the 2-day history of progressive dyspnea and active cough.  Patient denies any fever afebrile on presentation temperature 98.6.  Patient denies any lower extremity pain or swelling.  She denies any chest pain.        Past Medical History:  Diagnosis Date  . Anxiety   . Arthritis   . Breast cancer ([place]) 2000  . Cancer ([place]) left    breast cancer 2000, chemo tx's with total mastectomy and lymph nodes resected.   . Cancer of right lung ([place]) 02/21/2016   rad tx's.   . CHF (congestive heart failure) ([place])   . COPD (chronic obstructive pulmonary disease) ([place])   . Dependence on supplemental oxygen   . Depression   . Diabetes mellitus without complication ([place])   . Heart murmur   . Hypertension   . Lung nodule   . Lymphedema   . Personal history of chemotherapy   . Personal history of radiation therapy   . Shortness of breath dyspnea    with exertion  . Status post chemotherapy 2001   left breast cancer  . Status post radiation therapy 2001   left breast cancer    Patient Active Problem List   Diagnosis Date Noted  . Ileostomy dysfunction ([place]) 09/28/2018  . Reflux esophagitis   . Iron deficiency anemia secondary to blood loss (chronic)   . SBO (small bowel obstruction) ([place]) 06/29/2018  . Chronic systolic heart failure ([place]) 06/09/2018  . HTN (hypertension) 06/09/2018  . Atrial fibrillation ([place]) 06/09/2018  . Lymphedema 06/09/2018  . Protein-calorie malnutrition, severe 06/04/2018  . Blood in stool   . Focal (segmental) acute (reversible) ischemia of large  intestine ([place])   . Ulceration of intestine   . Diverticulosis of large intestine without diverticulitis   . Abnormal CT scan, colon   . Colitis   . COPD exacerbation ([place])   . Malnutrition of moderate degree 04/23/2018  . Palliative care by specialist   . DNR (do not resuscitate) discussion   . Weakness generalized   . Hyponatremia 02/10/2017  . Syncope 02/09/2017  . Lung nodule, solitary 07/25/2016  . Malignant neoplasm of upper lobe of right lung ([place]) 07/25/2016  . Carcinoma of overlapping sites of left breast in female, estrogen receptor positive ([place]) 06/22/2016  . Multiple lung nodules 06/22/2016  . Lesion of right lung   . Breast cancer in female [facility]) 02/21/2016  . Moderate COPD (chronic obstructive pulmonary disease) ([place]) 08/24/2014    Past Surgical History:  Procedure Laterality Date  . Breast Biospy Left    [facility]  . BREAST SURGERY    . COLONOSCOPY N/A 04/30/2018   Procedure: COLONOSCOPY;  Surgeon: Virgel Manifold, MD;  Location: [facility] ENDOSCOPY;  Service: Endoscopy;  Laterality: N/A;  . COLONOSCOPY N/A 07/22/2018   Procedure: COLONOSCOPY;  Surgeon: Virgel Manifold, MD;  Location: [facility] ENDOSCOPY;  Service: Endoscopy;  Laterality: N/A;  . DILATION AND CURETTAGE OF UTERUS    . ELECTROMAGNETIC NAVIGATION BROCHOSCOPY Right 04/11/2016   Procedure: ELECTROMAGNETIC NAVIGATION BRONCHOSCOPY;  Surgeon: Vilinda Boehringer, MD;  Location: [facility] ORS;  Service: Cardiopulmonary;  Laterality: Right;  . ESOPHAGOGASTRODUODENOSCOPY N/A 07/22/2018   Procedure: ESOPHAGOGASTRODUODENOSCOPY (EGD);  Surgeon: Virgel Manifold, MD;  Location[facility] ENDOSCOPY;  Service: Endoscopy;  Laterality: N/A;  . ESOPHAGOGASTRODUODENOSCOPY (EGD) WITH PROPOFOL N/A 05/07/2018   Procedure: ESOPHAGOGASTRODUODENOSCOPY (EGD) WITH PROPOFOL;  Surgeon: Lucilla Lame, MD;  Location: [facility] ENDOSCOPY;  Service: Endoscopy;  Laterality: N/A;  . history of colonoscopy]    . ILEOSCOPY N/A 07/22/2018   Procedure: ILEOSCOPY  THROUGH STOMA;  Surgeon: Virgel Manifold, MD;  Location: [facility] ENDOSCOPY;  Service: Endoscopy;  Laterality: N/A;  . ILEOSTOMY    . ILEOSTOMY N/A 09/08/2018   Procedure: ILEOSTOMY REVISION POSSIBLE CREATION;  Surgeon: Herbert Pun, MD;  Location: [facility] ORS;  Service: General;  Laterality: N/A;  . ILEOSTOMY CLOSURE N/A 08/15/2018   Procedure: DILATION OF ILEOSTOMY STRICTURE;  Surgeon: Herbert Pun, MD;  Location: [facility] ORS;  Service: General;  Laterality: N/A;  . LAPAROTOMY Right 05/04/2018   Procedure: EXPLORATORY LAPAROTOMY right colectomy right and left ostomy;  Surgeon: Herbert Pun, MD;  Location: [facility] ORS;  Service: General;  Laterality: Right;  . LUNG BIOPSY    . MASTECTOMY Left    2000, [facility]  . ROTATOR CUFF REPAIR Right    [facility]    Prior to Admission medications   Medication Sig Start Date End Date Taking? Authorizing Provider  albuterol (PROVENTIL HFA;VENTOLIN HFA) 108 (90 Base) MCG/ACT inhaler Inhale 2 puffs into the lungs every 6 (six) hours as needed for wheezing or shortness of breath. 04/24/18  Yes Demetrios Loll, MD  apixaban (ELIQUIS) 5 MG TABS tablet Take 1 tablet (5 mg total) by mouth 2 (two) times daily. 06/23/18  Yes [name], MD  budesonide-formoterol (SYMBICORT) 160-4.5 MCG/ACT inhaler Inhale 2 puffs into the lungs 2 (two) times daily. 04/24/18  Yes Demetrios Loll, MD  carvedilol (COREG) 6.25 MG tablet Take 1 tablet (6.25 mg total) by mouth 2 (two) times daily. 06/24/18  Yes Hackney, Tina A, FNP  citalopram (CELEXA) 40 MG tablet Take 40 mg by mouth daily.   Yes [provider]  ferrous sulfate 325 (65 FE) MG tablet Take 1 tablet (325 mg total) by mouth 2 (two) times daily with a meal. 07/02/18  Yes [name], MD  furosemide (LASIX) 40 MG tablet Take 40 mg by mouth 2 (two) times daily.    Yes [provider]  HYDROcodone-acetaminophen (NORCO/VICODIN) 5-325 MG tablet Take 1 tablet by mouth every 6 (six) hours as needed  for moderate pain or severe pain.   Yes [provider]  loperamide (IMODIUM A-D) 2 MG tablet Take 1 tablet (2 mg total) by mouth as needed for diarrhea or loose stools. 10/04/18  Yes Herbert Pun, MD  megestrol (MEGACE) 400 MG/10ML suspension Take 7.5 mLs (300 mg total) by mouth 2 (two) times daily. 06/05/18  Yes Fritzi Mandes, MD  Multiple Vitamin (MULTIVITAMIN WITH MINERALS) TABS tablet Take 1 tablet by mouth daily. 06/06/18  Yes Fritzi Mandes, MD  pantoprazole (PROTONIX) 40 MG tablet Take 1 tablet (40 mg total) by mouth 2 (two) times daily before a meal. 07/23/18  Yes Sudini, Srikar, MD  potassium chloride (K-DUR) 10 MEQ tablet Take 10 mEq by mouth daily.   Yes [provider]  traMADol (ULTRAM) 50 MG tablet Take 50 mg by mouth every 6 (six) hours as needed. for pain 07/28/18  Yes [provider]    Allergies Patient has no known allergies.  Family History  Problem Relation Age of Onset  . Breast  cancer Mother 68  . Cancer Mother        Breast   . Cirrhosis Father   . Breast cancer Paternal Aunt 63  . Cancer Maternal Aunt        Breast     Social History Social History   Tobacco Use  . Smoking status: Former Smoker    Packs/day: 0.50    Years: 20.00    Pack years: 10.00    Types: Cigarettes    Last attempt to quit: 07/02/2012    Years since quitting: 6.4  . Smokeless tobacco: Current User    Types: Snuff  . Tobacco comment: quit 2014  Substance Use Topics  . Alcohol use: Not Currently    Comment: Occasionally  . Drug use: No    Review of Systems Constitutional: No fever/chills Eyes: No visual changes. ENT: No sore throat. Cardiovascular: Denies chest pain. Respiratory: Positive for cough and dyspnea Gastrointestinal: No abdominal pain.  No nausea, no vomiting.  No diarrhea.  No constipation. Genitourinary: Negative for dysuria. Musculoskeletal: Negative for neck pain.  Negative for back pain. Integumentary: Negative for  rash. Neurological: Negative for headaches, focal weakness or numbness.   ____________________________________________   PHYSICAL EXAM:  VITAL SIGNS: ED Triage Vitals  Enc Vitals Group     BP 12/22/18 0312 132/78     Pulse Rate 12/22/18 0312 (!) 107     Resp 12/22/18 0312 (!) 30     Temp 12/22/18 0312 98.6 F (37 C)     Temp Source 12/22/18 0312 Oral     SpO2 12/22/18 0312 99 %     Weight 12/22/18 0315 59 kg (130 lb)     Height 12/22/18 0315 1.702 m (5\' 7" )     Head Circumference --      Peak Flow --      Pain Score 12/22/18 0313 7     Pain Loc --      Pain Edu? --      Excl. in [place]? --     Constitutional: Alert and oriented.  Apparent respiratory distress  eyes: Conjunctivae are normal.  Mouth/Throat: Mucous membranes are moist.  Oropharynx non-erythematous. Neck: No stridor.   Cardiovascular: Normal rate, regular rhythm. Good peripheral circulation. Grossly normal heart sounds. Respiratory: Tachypnea, positive accessory respiratory muscle use diffuse rhonchi.  Speaking 2 word phrases Gastrointestinal: Soft and nontender. No distention.  Musculoskeletal: No lower extremity tenderness nor edema. No gross deformities of extremities. Neurologic: No gross focal neurologic deficits are appreciated.  Skin:  Skin is warm, dry and intact. No rash noted. Psychiatric: Mood and affect are normal. Speech and behavior are normal.  ____________________________________________   LABS (all labs ordered are listed, but only abnormal results are displayed)  Labs Reviewed  CBC - Abnormal; Notable for the following components:      Result Value   RBC 3.66 (*)    Hemoglobin 10.9 (*)    HCT 34.4 (*)    All other components within normal limits  BLOOD GAS, VENOUS - Abnormal; Notable for the following components:   pH, Ven 7.18 (*)    Bicarbonate 16.8 (*)    Acid-base deficit 11.3 (*)    All other components within normal limits  CULTURE, BLOOD (ROUTINE X 2)  CULTURE, BLOOD  (ROUTINE X 2)  BASIC METABOLIC PANEL  TROPONIN I  BRAIN NATRIURETIC PEPTIDE  LACTIC ACID, PLASMA   ____________________________________________  EKG  ED ECG REPORT I, [place] N Orlandus Borowski, the attending physician, personally viewed and interpreted  this ECG.   Date: 12/22/2018  EKG Time: 3:10 AM  Rate: 109  Rhythm: Sinus tachycardia  Axis: Normal  Intervals:Normal  ST&T Change: None  ____________________________________________  RADIOLOGY I, [place] N Gelena Klosinski, personally viewed and evaluated these images (plain radiographs) as part of my medical decision making, as well as reviewing the written report by the radiologist.  ED MD interpretation: COPD without acute superimposed findings per radiologist on chest x-ray.  Official radiology report(s): Dg Chest Port 1 View  Result Date: 12/22/2018 CLINICAL DATA:  Productive cough and chest pain EXAM: PORTABLE CHEST 1 VIEW COMPARISON:  11/19/2018 chest CT FINDINGS: Streaky right perihilar density correlating with radiation fibrosis on prior chest CT. Generalized interstitial coarsening in this patient with COPD. There is no edema, consolidation, effusion, or pneumothorax. Normal heart size IMPRESSION: 1. COPD without acute superimposed finding. 2. 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   On: 12/22/2018 04:21    ____________________________________________   PROCEDURES   Procedure(s) performed (including Critical Care):  .Critical Care Performed by: Gregor Hams, MD Authorized by: Gregor Hams, MD   Critical care provider statement:    Critical care time (minutes):  30   Critical care time was exclusive of:  Separately billable procedures and treating other patients   Critical care was necessary to treat or prevent imminent or life-threatening deterioration of the following conditions:  Respiratory failure   Critical care was time spent personally by me  on the following activities:  Development of treatment plan with patient or surrogate, discussions with consultants, evaluation of patient's response to treatment, examination of patient, obtaining history from patient or surrogate, ordering and performing treatments and interventions, ordering and review of laboratory studies, ordering and review of radiographic studies, pulse oximetry, re-evaluation of patient's condition and review of old charts     ____________________________________________   INITIAL IMPRESSION / MDM / [place] / ED COURSE  As part of my medical decision making, I reviewed the following data within the electronic MEDICAL RECORD NUMBER  63 year old female presenting with above-stated history and physical exam concerning for COPD exacerbation, CHF exacerbation, pneumonia.  Chest x-ray findings consistent with COPD.  Patient placed on BiPAP given apparent respiratory distress 2 DuoNeb's and 125 mg of IV Solu-Medrol administered.  Patient given ceftriaxone 1 g IV and azithromycin 500 mg IV.  Patient discussed with Dr. Marcille Blanco for hospital admission further management.  On reevaluation patient's respiratory status improved ____________________________________________  FINAL CLINICAL IMPRESSION(S) / ED DIAGNOSES  Final diagnoses:  Acute respiratory failure with hypoxia ([place])     MEDICATIONS GIVEN DURING THIS VISIT:  Medications  sodium chloride flush (NS) 0.9 % injection 3 mL (3 mLs Intravenous Not Given 12/22/18 0351)  cefTRIAXone (ROCEPHIN) 1 g in sodium chloride 0.9 % 100 mL IVPB (has no administration in time range)  azithromycin (ZITHROMAX) 500 mg in sodium chloride 0.9 % 250 mL IVPB (has no administration in time range)  methylPREDNISolone sodium succinate (SOLU-MEDROL) 125 mg/2 mL injection 125 mg (125 mg Intravenous Given 12/22/18 0335)  ipratropium-albuterol (DUONEB) 0.5-2.5 (3) MG/3ML nebulizer solution 3 mL (3 mLs Nebulization Given 12/22/18 0335)   ipratropium-albuterol (DUONEB) 0.5-2.5 (3) MG/3ML nebulizer solution 3 mL (3 mLs Nebulization Given 12/22/18 0335)     ED Discharge Orders    None       Note:  This document was prepared using Dragon voice recognition software and may include unintentional dictation errors.   Marjean Donna  Delane Ginger, MD 12/22/18 [phone]

## 2018-12-22 NOTE — ED Notes (Signed)
RT contacted to transport pt

## 2018-12-22 NOTE — Progress Notes (Signed)
Patient ID: Danielle [name], female   DOB: 1955/11/17, 63 y.o.   MRN: [id number]  [employer] PROGRESS NOTE  Danielle [name]:[id number] DOB: 01/24/1956 DOA: 12/22/2018 PCP: Elisabeth Cara, NP  HPI/Subjective: Patient came in with shortness of breath and cough.  Objective: Vitals:   12/22/18 1000 12/22/18 1209  BP: 119/73   Pulse: 91   Resp: 18   Temp:    SpO2: 100% 99%    Filed Weights   12/22/18 0315  Weight: 59 kg    ROS: Review of Systems  Constitutional: Negative for chills and fever.  Eyes: Negative for blurred vision.  Respiratory: Positive for cough, shortness of breath and wheezing.   Cardiovascular: Negative for chest pain.  Gastrointestinal: Negative for abdominal pain, constipation, diarrhea, nausea and vomiting.  Genitourinary: Negative for dysuria.  Musculoskeletal: Negative for joint pain.  Neurological: Negative for dizziness and headaches.   Exam: Physical Exam  Constitutional: She is oriented to person, place, and time.  HENT:  Nose: No mucosal edema.  Mouth/Throat: No oropharyngeal exudate or posterior oropharyngeal edema.  Eyes: Pupils are equal, round, and reactive to light. Conjunctivae, EOM and lids are normal.  Neck: No JVD present. Carotid bruit is not present. No edema present. No thyroid mass and no thyromegaly present.  Cardiovascular: S1 normal and S2 normal. Exam reveals no gallop.  Murmur heard.  Systolic murmur is present with a grade of 2/6. Pulses:      Dorsalis pedis pulses are 2+ on the right side and 2+ on the left side.  Respiratory: No respiratory distress. She has decreased breath sounds in the right middle field, the right lower field, the left middle field and the left lower field. She has no wheezes. She has rhonchi in the right lower field and the left lower field. She has no rales.  GI: Soft. Bowel sounds are normal. There is no abdominal tenderness.  Musculoskeletal:     Right ankle: She exhibits no swelling.   Left ankle: She exhibits no swelling.  Lymphadenopathy:    She has no cervical adenopathy.  Neurological: She is alert and oriented to person, place, and time. No cranial nerve deficit.  Skin: Skin is warm. No rash noted. Nails show no clubbing.  Psychiatric: She has a normal mood and affect.      Data Reviewed: Basic Metabolic Panel: Recent Labs  Lab 12/22/18 0347  NA 137  K 3.8  CL 112*  CO2 18*  GLUCOSE 107*  BUN 37*  CREATININE 2.54*  CALCIUM 9.5   CBC: Recent Labs  Lab 12/22/18 0347  WBC 9.7  HGB 10.9*  HCT 34.4*  MCV 94.0  PLT 310   Cardiac Enzymes: Recent Labs  Lab 12/22/18 0347  TROPONINI <0.03   BNP (last 3 results) Recent Labs    05/02/18 0422 09/04/18 1942 12/22/18 0347  BNP >4,500.0* 182.0* 80.0    CBG: Recent Labs  Lab 12/22/18 0831  GLUCAP 114*    Recent Results (from the past 240 hour(s))  Blood culture (routine x 2)     Status: None (Preliminary result)   Collection Time: 12/22/18  4:37 AM  Result Value Ref Range Status   Specimen Description BLOOD R AC  Final   Special Requests   Final    BOTTLES DRAWN AEROBIC AND ANAEROBIC Blood Culture results may not be optimal due to an excessive volume of blood received in culture bottles   Culture   Final    NO GROWTH <12 HOURS Performed at  [facility], [address], [place], [place] [address]    Report Status PENDING  Incomplete  Blood culture (routine x 2)     Status: None (Preliminary result)   Collection Time: 12/22/18  4:37 AM  Result Value Ref Range Status   Specimen Description BLOOD R HAND  Final   Special Requests   Final    BOTTLES DRAWN AEROBIC AND ANAEROBIC Blood Culture adequate volume   Culture   Final    NO GROWTH <12 HOURS Performed at [facility], [address] [place], [place] [address]    Report Status PENDING  Incomplete  MRSA PCR Screening     Status: None   Collection Time: 12/22/18  8:41 AM  Result Value Ref Range Status   MRSA by  PCR NEGATIVE NEGATIVE Final    Comment:        The GeneXpert MRSA Assay (FDA approved for NASAL specimens only), is one component of a comprehensive MRSA colonization surveillance program. It is not intended to diagnose MRSA infection nor to guide or monitor treatment for MRSA infections. Performed at [facility], [address] [place], [place] [address]      Studies: Dg Chest [facility] 1 View  Result Date: 12/22/2018 CLINICAL DATA:  Productive cough and chest pain EXAM: PORTABLE CHEST 1 VIEW COMPARISON:  11/19/2018 chest CT FINDINGS: Streaky right perihilar density correlating with radiation fibrosis on prior chest CT. Generalized interstitial coarsening in this patient with COPD. There is no edema, consolidation, effusion, or pneumothorax. Normal heart size IMPRESSION: 1. COPD without acute superimposed finding. 2. 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   On: 12/22/2018 04:21    Scheduled Meds: . apixaban  2.5 mg Oral BID  . budesonide (PULMICORT) nebulizer solution  0.25 mg Nebulization BID  . carvedilol  6.25 mg Oral BID  . docusate sodium  100 mg Oral BID  . ipratropium-albuterol  3 mL Nebulization Q6H  . [START ON 12/23/2018] pantoprazole  40 mg Oral Daily  . [START ON 12/23/2018] predniSONE  50 mg Oral Q breakfast  . sodium chloride flush  3 mL Intravenous Once   Continuous Infusions: . [START ON 12/23/2018] azithromycin      Assessment/Plan:  1. Acute hypoxic respiratory failure.  Acute respiratory acidosis.  Patient initially on BiPAP and then tapered over to nasal cannula.  Continue nebulizer treatments and prednisone 2. COPD exacerbation.  Continue nebulizer treatments and prednisone.  Patient wears oxygen as needed as home.  We will have to check oxygen with ambulation. 3. Acute kidney injury on chronic kidney disease.  Patient was given IV fluids.  Check BMP daily. 4. Chronic systolic  congestive heart failure.  Patient was given IV fluids.  Watch closely with holding diuretics 5. History of breast cancer  Code Status:     Code Status Orders  (From admission, onward)         Start     Ordered   12/22/18 0845  Full code  Continuous     12/22/18 0844        Code Status History    Date Active Date Inactive Code Status Order ID Comments User Context   09/28/2018 0813 10/04/2018 1522 Full Code [id number]  Herbert Pun, MD Inpatient   09/05/2018 0616 09/10/2018 1708 Full Code [id number]  Arta Silence, MD ED   08/12/2018 0540 08/16/2018 2057 Full Code [id number]  Arta Silence, MD ED   07/21/2018 [phone]  07/23/2018 1718 Full Code [id number]  Derrek Monaco, RN Inpatient   07/18/2018 0640 07/21/2018 0330 DNR [id number]  Harrie Foreman, MD Inpatient   07/05/2018 0808 07/09/2018 1946 DNR [id number]  Harrie Foreman, MD Inpatient   06/30/2018 1733 07/02/2018 1507 DNR [id number]  [name], MD Inpatient   06/30/2018 0019 06/30/2018 1733 Full Code [id number]  Amelia Jo, MD Inpatient   06/04/2018 0113 06/05/2018 1448 Full Code [id number]  Amelia Jo, MD Inpatient   04/21/2018 [phone] 05/17/2018 2214 Full Code [id number]  Arta Silence, MD Inpatient   02/09/2017 1125 02/11/2017 1706 Full Code [id number]  Henreitta Leber, MD Inpatient    Advance Directive Documentation     Most Recent Value  Type of Advance Directive  Healthcare Power of Attorney  Pre-existing out of facility DNR order (yellow form or pink MOST form)  -  "MOST" Form in Place?  -     Family Communication: As per critical care team Disposition Plan: To be determined  Consultants:  Critical care specialist  Time spent: 30 minutes.  Case discussed with nursing staff and respiratory  Elexus Barman [employer]

## 2018-12-22 NOTE — Progress Notes (Signed)
CODE SEPSIS - PHARMACY COMMUNICATION  **Broad Spectrum Antibiotics should be administered within 1 hour of Sepsis diagnosis**  Time Code Sepsis Called/Page Received: @ 0424  Antibiotics Ordered: Ceftriaxone and Azithromycin   Time of 1st antibiotic administration: @ 7989  Additional action taken by pharmacy: N/A  If necessary, Name of Provider/Nurse Contacted: N/A  Pernell Dupre, PharmD, BCPS Clinical Pharmacist 12/22/2018 4:59 AM

## 2018-12-22 NOTE — ED Notes (Signed)
Cannot call report  - no RNs in ICU to take pt per [place], charge  Pt's ileostomy bag emptied

## 2018-12-22 NOTE — Progress Notes (Signed)
PHARMACY NOTE:  ANTICOAGULANT RENAL DOSAGE ADJUSTMENT  Current antimicrobial regimen includes a mismatch between anticoagulant dosage and estimated renal function.  As per policy approved by the Pharmacy & Therapeutics and Medical Executive Committees, the anticoagulant dosage will be adjusted accordingly.  Current anticoagulant dosage:  Apixaban 5mg  BID   Indication: atrial fibrillation per surgery note. Not noted in cardiology note.   Renal Function:  Estimated Creatinine Clearance: 20.8 mL/min (A) (by C-G formula based on SCr of 2.54 mg/dL (H)).    Anticoagulant dosage has been changed to:  apixaban 2.5mg  BID.   Additional comments: Will follow weight and serum creatinine daily and adjust as warranted.   Thank you for allowing pharmacy to be a part of this patient's care.  Simpson,Michael L, [facility] 12/22/2018 3:07 PM

## 2018-12-22 NOTE — Consult Note (Signed)
PCCM CONSULT NOTE   PT PROFILE: 63 y.o. female former smoker with history of COPD, lung cancer, breast cancer, prior radiation therapy to RUL admitted via [facility] ED with acute respiratory distress and hypoxemia.  Admitted to ICU/SDU on BiPAP  DATA:   HPI:  As above.  Her shortness of breath has persisted for 3 days.  She also notes cough.  Her inhaler provided insufficient relief prior to presentation to the emergency department.  She denies fever, hemoptysis, pleuritic chest pain, orthopnea, paroxysmal nocturnal dyspnea, lower extremity edema, calf tenderness.   Past Medical History:  Diagnosis Date  . Anxiety   . Arthritis   . Breast cancer ([place]) 2000  . Cancer ([place]) left    breast cancer 2000, chemo tx's with total mastectomy and lymph nodes resected.   . Cancer of right lung ([place]) 02/21/2016   rad tx's.   . CHF (congestive heart failure) ([place])   . COPD (chronic obstructive pulmonary disease) ([place])   . Dependence on supplemental oxygen   . Depression   . Diabetes mellitus without complication ([place])   . Heart murmur   . Hypertension   . Lung nodule   . Lymphedema   . Personal history of chemotherapy   . Personal history of radiation therapy   . Shortness of breath dyspnea    with exertion  . Status post chemotherapy 2001   left breast cancer  . Status post radiation therapy 2001   left breast cancer    Past Surgical History:  Procedure Laterality Date  . Breast Biospy Left    [facility]  . BREAST SURGERY    . COLONOSCOPY N/A 04/30/2018   Procedure: COLONOSCOPY;  Surgeon: Virgel Manifold, MD;  Location: [facility] ENDOSCOPY;  Service: Endoscopy;  Laterality: N/A;  . COLONOSCOPY N/A 07/22/2018   Procedure: COLONOSCOPY;  Surgeon: Virgel Manifold, MD;  Location: [facility] ENDOSCOPY;  Service: Endoscopy;  Laterality: N/A;  . DILATION AND CURETTAGE OF UTERUS    . ELECTROMAGNETIC NAVIGATION BROCHOSCOPY Right 04/11/2016   Procedure: ELECTROMAGNETIC NAVIGATION BRONCHOSCOPY;  Surgeon:  Vilinda Boehringer, MD;  Location: [facility] ORS;  Service: Cardiopulmonary;  Laterality: Right;  . ESOPHAGOGASTRODUODENOSCOPY N/A 07/22/2018   Procedure: ESOPHAGOGASTRODUODENOSCOPY (EGD);  Surgeon: Virgel Manifold, MD;  Location: [facility] ENDOSCOPY;  Service: Endoscopy;  Laterality: N/A;  . ESOPHAGOGASTRODUODENOSCOPY (EGD) WITH PROPOFOL N/A 05/07/2018   Procedure: ESOPHAGOGASTRODUODENOSCOPY (EGD) WITH PROPOFOL;  Surgeon: Lucilla Lame, MD;  Location: [facility] ENDOSCOPY;  Service: Endoscopy;  Laterality: N/A;  . history of colonoscopy]    . ILEOSCOPY N/A 07/22/2018   Procedure: ILEOSCOPY THROUGH STOMA;  Surgeon: Virgel Manifold, MD;  Location: [facility] ENDOSCOPY;  Service: Endoscopy;  Laterality: N/A;  . ILEOSTOMY    . ILEOSTOMY N/A 09/08/2018   Procedure: ILEOSTOMY REVISION POSSIBLE CREATION;  Surgeon: Herbert Pun, MD;  Location: [facility] ORS;  Service: General;  Laterality: N/A;  . ILEOSTOMY CLOSURE N/A 08/15/2018   Procedure: DILATION OF ILEOSTOMY STRICTURE;  Surgeon: Herbert Pun, MD;  Location: [facility] ORS;  Service: General;  Laterality: N/A;  . LAPAROTOMY Right 05/04/2018   Procedure: EXPLORATORY LAPAROTOMY right colectomy right and left ostomy;  Surgeon: Herbert Pun, MD;  Location: [facility] ORS;  Service: General;  Laterality: Right;  . LUNG BIOPSY    . MASTECTOMY Left    2000, [facility]  . ROTATOR CUFF REPAIR Right    [facility]    MEDICATIONS: I have reviewed all medications and confirmed regimen as documented  Social History   Socioeconomic History  . Marital status: Divorced  Spouse name: Not on file  . Number of children: Not on file  . Years of education: Not on file  . Highest education level: Not on file  Occupational History  . Not on file  Social Needs  . Financial resource strain: Hard  . Food insecurity:    Worry: Often true    Inability: Often true  . Transportation needs:    Medical: No    Non-medical: No  Tobacco Use  . Smoking status: Former Smoker     Packs/day: 0.50    Years: 20.00    Pack years: 10.00    Types: Cigarettes    Last attempt to quit: 07/02/2012    Years since quitting: 6.4  . Smokeless tobacco: Current User    Types: Snuff  . Tobacco comment: quit 2014  Substance and Sexual Activity  . Alcohol use: Not Currently    Comment: Occasionally  . Drug use: No  . Sexual activity: Not Currently  Lifestyle  . Physical activity:    Days per week: 2 days    Minutes per session: 30 min  . Stress: Not at all  Relationships  . Social connections:    Talks on phone: More than three times a week    Gets together: More than three times a week    Attends religious service: 1 to 4 times per year    Active member of club or organization: No    Attends meetings of clubs or organizations: Never    Relationship status: Divorced  . Intimate partner violence:    Fear of current or ex partner: No    Emotionally abused: No    Physically abused: No    Forced sexual activity: No  Other Topics Concern  . Not on file  Social History Narrative  . Not on file    Family History  Problem Relation Age of Onset  . Breast cancer Mother 16  . Cancer Mother        Breast   . Cirrhosis Father   . Breast cancer Paternal Aunt 55  . Cancer Maternal Aunt        Breast     ROS: As above.  Otherwise, detailed ROS unrevealing   Vitals:   12/22/18 0842 12/22/18 0900 12/22/18 1000 12/22/18 1209  BP: (!) 126/96 112/76 119/73   Pulse: 99 92 91   Resp: (!) 27 20 18    Temp: 99.1 F (37.3 C)     TempSrc: Axillary     SpO2: 99% 100% 100% 99%  Weight:      Height:      2 LPM [place]   EXAM:  Gen: WDWN, No overt respiratory distress presently HEENT: NCAT, sclera white, oropharynx normal Neck: Supple without LAN, thyromegaly, JVD Lungs: Few scattered wheezes Cardiovascular: RRR, no murmurs noted Abdomen: Soft, nontender, normal BS Ext: without clubbing, cyanosis, edema Neuro: CNs grossly intact, motor and sensory intact Skin: Limited  exam, no lesions noted  DATA:   BMP Latest Ref Rng & Units 12/22/2018 10/22/2018 10/04/2018  Glucose 70 - 99 mg/dL 107(H) 83 -  BUN 8 - 23 mg/dL 37(H) 17 -  Creatinine 0.44 - 1.00 mg/dL 2.54(H) 1.53(H) 1.22(H)  Sodium 135 - 145 mmol/L 137 138 -  Potassium 3.5 - 5.1 mmol/L 3.8 3.8 -  Chloride 98 - 111 mmol/L 112(H) 112(H) -  CO2 22 - 32 mmol/L 18(L) 17(L) -  Calcium 8.9 - 10.3 mg/dL 9.5 9.7 -    CBC Latest Ref Rng &  Units 12/22/2018 10/04/2018 09/22/2018  WBC 4.0 - 10.5 K/uL 9.7 7.7 7.7  Hemoglobin 12.0 - 15.0 g/dL 10.9(L) 7.7(L) 9.2(L)  Hematocrit 36.0 - 46.0 % 34.4(L) 24.5(L) 29.6(L)  Platelets 150 - 400 K/uL 310 272 407(H)    CXR: Chronic opacity in RUL.  No acute findings  I have personally reviewed all chest radiographs reported above including CXRs and CT chest unless otherwise indicated  IMPRESSION:   Acute/chronic respiratory failure COPD exacerbation History of lung cancer Former smoker S/P XRT to RUL History of CAD History of CHF History of L breast cancer, status post mastectomy AKI on CKD  On chronic anticoagulation (apixaban), unclear indication   PLAN:  Change BiPAP to as needed Supplemental oxygen as needed Initiate nebulized steroids and bronchodilators Continue azithromycin.  Likely change to p.o. 3/10 Continue systemic steroids (prednisone 50 mg daily) Continue apixaban.  Clarify indication for this medication Consider transfer to [place] floor later today  Merton Border, MD PCCM service Mobile [phone] Pager [phone] 12/22/2018 1:55 PM

## 2018-12-22 NOTE — ED Triage Notes (Signed)
Pt reports shortness of breath for 2 days; pt arrives on oxygen that she has from home but hasn't needed in months; denies fever at home; pt reports productive cough at times; inspiratory and expiratory wheezes noted with decreased air movement; short of breath at rest and with exertion;

## 2018-12-22 NOTE — ED Notes (Addendum)
ED TO INPATIENT HANDOFF REPORT  ED Nurse Name and Phone #: [place] [employer], [phone]  S Name/Age/Gender Danielle [name] 63 y.o. female Room/Bed: ED01A/ED01A  Code Status   Code Status: Prior  Home/SNF/Other Home   Triage Complete: Triage complete  Chief Complaint difficulty breathing  Triage Note Pt reports shortness of breath for 2 days; pt arrives on oxygen that she has from home but hasn't needed in months; denies fever at home; pt reports productive cough at times; inspiratory and expiratory wheezes noted with decreased air movement; short of breath at rest and with exertion;    Allergies No Known Allergies  Level of Care/Admitting Diagnosis ED Disposition    ED Disposition Condition [place] [facility] Area: [place] [[id number]]  Level of Care: Stepdown [14]  Diagnosis: Acute on chronic respiratory failure with hypoxia and hypercapnia [facility]) [[id number]]  Admitting Physician: Harrie Foreman [[id number]]  Attending Physician: Harrie Foreman [[id number]]  Estimated length of stay: past midnight tomorrow  Certification:: I certify this patient will need inpatient services for at least 2 midnights  PT Class (Do Not Modify): Inpatient [101]  PT Acc Code (Do Not Modify): Private [1]       B Medical/Surgery History Past Medical History:  Diagnosis Date  . Anxiety   . Arthritis   . Breast cancer ([place]) 2000  . Cancer ([name]) left    breast cancer 2000, chemo tx's with total mastectomy and lymph nodes resected.   . Cancer of right lung ([place]) 02/21/2016   rad tx's.   . CHF (congestive heart failure) ([place])   . COPD (chronic obstructive pulmonary disease) ([place])   . Dependence on supplemental oxygen   . Depression   . Diabetes mellitus without complication ([place])   . Heart murmur   . Hypertension   . Lung nodule   . Lymphedema   . Personal history of chemotherapy   . Personal history of radiation therapy   . Shortness of breath dyspnea    with exertion   . Status post chemotherapy 2001   left breast cancer  . Status post radiation therapy 2001   left breast cancer   Past Surgical History:  Procedure Laterality Date  . Breast Biospy Left    [facility]  . BREAST SURGERY    . COLONOSCOPY N/A 04/30/2018   Procedure: COLONOSCOPY;  Surgeon: Virgel Manifold, MD;  Location: [facility] ENDOSCOPY;  Service: Endoscopy;  Laterality: N/A;  . COLONOSCOPY N/A 07/22/2018   Procedure: COLONOSCOPY;  Surgeon: Virgel Manifold, MD;  Location: [facility] ENDOSCOPY;  Service: Endoscopy;  Laterality: N/A;  . DILATION AND CURETTAGE OF UTERUS    . ELECTROMAGNETIC NAVIGATION BROCHOSCOPY Right 04/11/2016   Procedure: ELECTROMAGNETIC NAVIGATION BRONCHOSCOPY;  Surgeon: Vilinda Boehringer, MD;  Location: [facility] ORS;  Service: Cardiopulmonary;  Laterality: Right;  . ESOPHAGOGASTRODUODENOSCOPY N/A 07/22/2018   Procedure: ESOPHAGOGASTRODUODENOSCOPY (EGD);  Surgeon: Virgel Manifold, MD;  Location: [facility] ENDOSCOPY;  Service: Endoscopy;  Laterality: N/A;  . ESOPHAGOGASTRODUODENOSCOPY (EGD) WITH PROPOFOL N/A 05/07/2018   Procedure: ESOPHAGOGASTRODUODENOSCOPY (EGD) WITH PROPOFOL;  Surgeon: Lucilla Lame, MD;  Location: [facility] ENDOSCOPY;  Service: Endoscopy;  Laterality: N/A;  . history of colonoscopy]    . ILEOSCOPY N/A 07/22/2018   Procedure: ILEOSCOPY THROUGH STOMA;  Surgeon: Virgel Manifold, MD;  Location: [facility] ENDOSCOPY;  Service: Endoscopy;  Laterality: N/A;  . ILEOSTOMY    . ILEOSTOMY N/A 09/08/2018   Procedure: ILEOSTOMY REVISION POSSIBLE CREATION;  Surgeon: Herbert Pun, MD;  Location: [facility] ORS;  Service:  General;  Laterality: N/A;  . ILEOSTOMY CLOSURE N/A 08/15/2018   Procedure: DILATION OF ILEOSTOMY STRICTURE;  Surgeon: Herbert Pun, MD;  Location: [facility] ORS;  Service: General;  Laterality: N/A;  . LAPAROTOMY Right 05/04/2018   Procedure: EXPLORATORY LAPAROTOMY right colectomy right and left ostomy;  Surgeon: Herbert Pun, MD;  Location: [facility] ORS;   Service: General;  Laterality: Right;  . LUNG BIOPSY    . MASTECTOMY Left    2000, [facility]  . ROTATOR CUFF REPAIR Right    [facility]     A IV Location/Drains/Wounds Patient Lines/Drains/Airways Status   Active Line/Drains/Airways    Name:   Placement date:   Placement time:   Site:   Days:   Peripheral IV 12/22/18 Right Antecubital   12/22/18    0340    Antecubital   less than 1   Ileostomy Standard (end) RUQ   06/30/18    0118    RUQ   175   Incision (Closed) 09/08/18 Abdomen Other (Comment)   09/08/18    1500     105          Intake/Output Last 24 hours  Intake/Output Summary (Last 24 hours) at 12/22/2018 0718 Last data filed at 12/22/2018 0617 Gross per 24 hour  Intake 302.72 ml  Output -  Net 302.72 ml    Labs/Imaging Results for orders placed or performed during the hospital encounter of 12/22/18 (from the past 48 hour(s))  Basic metabolic panel     Status: Abnormal   Collection Time: 12/22/18  3:47 AM  Result Value Ref Range   Sodium 137 135 - 145 mmol/L   Potassium 3.8 3.5 - 5.1 mmol/L   Chloride 112 (H) 98 - 111 mmol/L   CO2 18 (L) 22 - 32 mmol/L   Glucose, Bld 107 (H) 70 - 99 mg/dL   BUN 37 (H) 8 - 23 mg/dL   Creatinine, Ser 2.54 (H) 0.44 - 1.00 mg/dL   Calcium 9.5 8.9 - 10.3 mg/dL   GFR calc non Af Amer 19 (L) >60 mL/min   GFR calc Af Amer 22 (L) >60 mL/min   Anion gap 7 5 - 15    Comment: Performed at [facility], [address] [place], [place] [address]  CBC     Status: Abnormal   Collection Time: 12/22/18  3:47 AM  Result Value Ref Range   WBC 9.7 4.0 - 10.5 K/uL   RBC 3.66 (L) 3.87 - 5.11 MIL/uL   Hemoglobin 10.9 (L) 12.0 - 15.0 g/dL   HCT 34.4 (L) 36.0 - 46.0 %   MCV 94.0 80.0 - 100.0 fL   MCH 29.8 26.0 - 34.0 pg   MCHC 31.7 30.0 - 36.0 g/dL   RDW 12.9 11.5 - 15.5 %   Platelets 310 150 - 400 K/uL   nRBC 0.0 0.0 - 0.2 %    Comment: Performed at [facility], [address] [place], [place] [address]  Troponin I - ONCE - STAT      Status: None   Collection Time: 12/22/18  3:47 AM  Result Value Ref Range   Troponin I <0.03 <0.03 ng/mL    Comment: Performed at [facility], [address] [place], [place] [address]  Brain natriuretic peptide     Status: None   Collection Time: 12/22/18  3:47 AM  Result Value Ref Range   B Natriuretic Peptide 80.0 0.0 - 100.0 pg/mL    Comment: Performed at [facility], [address] [place], [place] [address]  Blood gas, venous     Status: Abnormal   Collection Time: 12/22/18  3:47 AM  Result Value Ref Range   FIO2 0.28    Delivery systems NASAL CANNULA    pH, Ven 7.18 (LL) 7.250 - 7.430    Comment: CRITICAL RESULT CALLED TO, READ BACK BY AND VERIFIED WITH: BROWN, MD AT 0356 ON03/06/2019    pCO2, Ven 45 44.0 - 60.0 mmHg   pO2, Ven 40.0 32.0 - 45.0 mmHg   Bicarbonate 16.8 (L) 20.0 - 28.0 mmol/L   Acid-base deficit 11.3 (H) 0.0 - 2.0 mmol/L   O2 Saturation 59.6 %   Patient temperature 37.0    Collection site VEIN    Sample type VENOUS     Comment: Performed at [facility], [address] [place], [place] [address]  Lactic acid, plasma     Status: None   Collection Time: 12/22/18  4:37 AM  Result Value Ref Range   Lactic Acid, Venous 0.6 0.5 - 1.9 mmol/L    Comment: Performed at [facility], [address] [place], [place] [address]   Dg Chest Port 1 View  Result Date: 12/22/2018 CLINICAL DATA:  Productive cough and chest pain EXAM: PORTABLE CHEST 1 VIEW COMPARISON:  11/19/2018 chest CT FINDINGS: Streaky right perihilar density correlating with radiation fibrosis on prior chest CT. Generalized interstitial coarsening in this patient with COPD. There is no edema, consolidation, effusion, or pneumothorax. Normal heart size IMPRESSION: 1. COPD without acute superimposed finding. 2. 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   On: 12/22/2018 04:21    Pending  Labs Unresulted Labs (From admission, onward)    Start     Ordered   12/22/18 0422  Blood culture (routine x 2)  BLOOD CULTURE X 2,   STAT     12/22/18 0421   Signed and Held  TSH  Add-on,   R     Signed and Held          Vitals/Pain Today's Vitals   12/22/18 0435 12/22/18 0506 12/22/18 0618 12/22/18 0630  BP: 102/78 138/87 109/83 117/64  Pulse: 95 87 93 (!) 102  Resp: (!) 21 19 (!) 21   Temp:      TempSrc:      SpO2: 100% 100% 98% 100%  Weight:      Height:      PainSc: 5  3  Asleep     Isolation Precautions No active isolations  Medications Medications  sodium chloride flush (NS) 0.9 % injection 3 mL (3 mLs Intravenous Not Given 12/22/18 0351)  methylPREDNISolone sodium succinate (SOLU-MEDROL) 125 mg/2 mL injection 125 mg (125 mg Intravenous Given 12/22/18 0335)  ipratropium-albuterol (DUONEB) 0.5-2.5 (3) MG/3ML nebulizer solution 3 mL (3 mLs Nebulization Given 12/22/18 0335)  ipratropium-albuterol (DUONEB) 0.5-2.5 (3) MG/3ML nebulizer solution 3 mL (3 mLs Nebulization Given 12/22/18 0335)  cefTRIAXone (ROCEPHIN) 1 g in sodium chloride 0.9 % 100 mL IVPB (0 g Intravenous Stopped 12/22/18 0504)  azithromycin (ZITHROMAX) 500 mg in sodium chloride 0.9 % 250 mL IVPB ( Intravenous Stopped 12/22/18 0614)    Mobility Low fall risk      R Recommendations: See Admitting Provider Note  Report given to:  Alexi RN

## 2018-12-22 NOTE — ED Notes (Signed)
CCU notified that pt being transported at this time

## 2018-12-23 LAB — CBC
HEMATOCRIT: 26.7 % — AB (ref 36.0–46.0)
HEMOGLOBIN: 8.3 g/dL — AB (ref 12.0–15.0)
MCH: 29.9 pg (ref 26.0–34.0)
MCHC: 31.1 g/dL (ref 30.0–36.0)
MCV: 96 fL (ref 80.0–100.0)
Platelets: 241 10*3/uL (ref 150–400)
RBC: 2.78 MIL/uL — ABNORMAL LOW (ref 3.87–5.11)
RDW: 12.8 % (ref 11.5–15.5)
WBC: 7.4 10*3/uL (ref 4.0–10.5)
nRBC: 0 % (ref 0.0–0.2)

## 2018-12-23 LAB — BASIC METABOLIC PANEL
Anion gap: 5 (ref 5–15)
BUN: 37 mg/dL — ABNORMAL HIGH (ref 8–23)
CHLORIDE: 117 mmol/L — AB (ref 98–111)
CO2: 16 mmol/L — ABNORMAL LOW (ref 22–32)
Calcium: 9 mg/dL (ref 8.9–10.3)
Creatinine, Ser: 2.17 mg/dL — ABNORMAL HIGH (ref 0.44–1.00)
GFR calc Af Amer: 27 mL/min — ABNORMAL LOW (ref >60)
GFR calc non Af Amer: 23 mL/min — ABNORMAL LOW (ref >60)
Glucose, Bld: 128 mg/dL — ABNORMAL HIGH (ref 70–99)
Potassium: 4.2 mmol/L (ref 3.5–5.1)
Sodium: 138 mmol/L (ref 135–145)

## 2018-12-23 MED ORDER — FAMOTIDINE 20 MG PO TABS: 20.0000 mg | ORAL_TABLET | Freq: Every day | ORAL | Status: DC

## 2018-12-23 MED ORDER — FAMOTIDINE 20 MG PO TABS
20.0000 mg | ORAL_TABLET | Freq: Every day | ORAL | Status: DC
  Administered 2018-12-23 – 2018-12-24 (×2): 20 mg via ORAL
  Filled 2018-12-23 (×2): qty 1

## 2018-12-23 MED ORDER — LIDOCAINE VISCOUS HCL 2 % MT SOLN
15.0000 mL | Freq: Once | OROMUCOSAL | Status: AC
  Administered 2018-12-23: 15 mL via ORAL
  Filled 2018-12-23: qty 15

## 2018-12-23 MED ORDER — METHYLPREDNISOLONE SODIUM SUCC 40 MG IJ SOLR
40.0000 mg | Freq: Once | INTRAMUSCULAR | Status: AC
  Administered 2018-12-23: 40 mg via INTRAVENOUS
  Filled 2018-12-23: qty 1

## 2018-12-23 MED ORDER — ALUM & MAG HYDROXIDE-SIMETH 200-200-20 MG/5ML PO SUSP
30.0000 mL | Freq: Once | ORAL | Status: AC
  Administered 2018-12-23: 30 mL via ORAL
  Filled 2018-12-23: qty 30

## 2018-12-23 MED ORDER — IPRATROPIUM-ALBUTEROL 0.5-2.5 (3) MG/3ML IN SOLN
3.0000 mL | Freq: Three times a day (TID) | RESPIRATORY_TRACT | Status: DC
  Administered 2018-12-23 – 2018-12-24 (×3): 3 mL via RESPIRATORY_TRACT
  Filled 2018-12-23 (×3): qty 3

## 2018-12-23 MED ORDER — SODIUM CHLORIDE 0.9 % IV SOLN
INTRAVENOUS | Status: DC
  Administered 2018-12-23: 17:00:00 via INTRAVENOUS

## 2018-12-23 NOTE — Progress Notes (Signed)
Patient ID: Danielle [name], female   DOB: [birth year], 63 y.o.   MRN: [id number]  [employer] PROGRESS NOTE  Danielle [name] ZDG:[id number] DOB: [birth year] DOA: 12/22/2018 PCP: Elisabeth Cara, NP  HPI/Subjective: Patient came in with shortness of breath and cough.  Still coughing intermittently and reporting shortness of breath with exertion  Objective: Vitals:   12/23/18 0909 12/23/18 1416  BP:    Pulse: 89 91  Resp: 18 16  Temp:    SpO2: 99% 98%    Filed Weights   12/22/18 0315  Weight: 59 kg    ROS: Review of Systems  Constitutional: Negative for chills and fever.  Eyes: Negative for blurred vision.  Respiratory: Positive for cough, shortness of breath and wheezing.   Cardiovascular: Negative for chest pain.  Gastrointestinal: Negative for abdominal pain, constipation, diarrhea, nausea and vomiting.  Genitourinary: Negative for dysuria.  Musculoskeletal: Negative for joint pain.  Neurological: Negative for dizziness and headaches.   Exam: Physical Exam  Constitutional: She is oriented to person, place, and time.  HENT:  Nose: No mucosal edema.  Mouth/Throat: No oropharyngeal exudate or posterior oropharyngeal edema.  Eyes: Pupils are equal, round, and reactive to light. Conjunctivae, EOM and lids are normal.  Neck: No JVD present. Carotid bruit is not present. No edema present. No thyroid mass and no thyromegaly present.  Cardiovascular: S1 normal and S2 normal. Exam reveals no gallop.  Murmur heard.  Systolic murmur is present with a grade of 2/6. Pulses:      Dorsalis pedis pulses are 2+ on the right side and 2+ on the left side.  Respiratory: No respiratory distress. She has decreased breath sounds in the right middle field, the right lower field, the left middle field and the left lower field. She has no wheezes. She has rhonchi in the right lower field and the left lower field. She has no rales.  GI: Soft. Bowel sounds are normal. There is no abdominal  tenderness.  Musculoskeletal:     Right ankle: She exhibits no swelling.     Left ankle: She exhibits no swelling.  Lymphadenopathy:    She has no cervical adenopathy.  Neurological: She is alert and oriented to person, place, and time. No cranial nerve deficit.  Skin: Skin is warm. No rash noted. Nails show no clubbing.  Psychiatric: She has a normal mood and affect.      Data Reviewed: Basic Metabolic Panel: Recent Labs  Lab 12/22/18 0347 12/23/18 0528  NA 137 138  K 3.8 4.2  CL 112* 117*  CO2 18* 16*  GLUCOSE 107* 128*  BUN 37* 37*  CREATININE 2.54* 2.17*  CALCIUM 9.5 9.0   CBC: Recent Labs  Lab 12/22/18 0347 12/23/18 0528  WBC 9.7 7.4  HGB 10.9* 8.3*  HCT 34.4* 26.7*  MCV 94.0 96.0  PLT 310 241   Cardiac Enzymes: Recent Labs  Lab 12/22/18 0347  TROPONINI <0.03   BNP (last 3 results) Recent Labs    05/02/18 0422 09/04/18 1942 12/22/18 0347  BNP >4,500.0* 182.0* 80.0    CBG: Recent Labs  Lab 12/22/18 0831  GLUCAP 114*    Recent Results (from the past 240 hour(s))  Blood culture (routine x 2)     Status: None (Preliminary result)   Collection Time: 12/22/18  4:37 AM  Result Value Ref Range Status   Specimen Description BLOOD R AC  Final   Special Requests   Final    BOTTLES DRAWN AEROBIC AND ANAEROBIC Blood Culture  results may not be optimal due to an excessive volume of blood received in culture bottles   Culture   Final    NO GROWTH 1 DAY Performed at [facility], [address] [place], [place] [address]    Report Status PENDING  Incomplete  Blood culture (routine x 2)     Status: None (Preliminary result)   Collection Time: 12/22/18  4:37 AM  Result Value Ref Range Status   Specimen Description BLOOD R HAND  Final   Special Requests   Final    BOTTLES DRAWN AEROBIC AND ANAEROBIC Blood Culture adequate volume   Culture   Final    NO GROWTH 1 DAY Performed at [facility], [address], [place], [place]  [address]    Report Status PENDING  Incomplete  MRSA PCR Screening     Status: None   Collection Time: 12/22/18  8:41 AM  Result Value Ref Range Status   MRSA by PCR NEGATIVE NEGATIVE Final    Comment:        The GeneXpert MRSA Assay (FDA approved for NASAL specimens only), is one component of a comprehensive MRSA colonization surveillance program. It is not intended to diagnose MRSA infection nor to guide or monitor treatment for MRSA infections. Performed at [facility], [address] [place], [place] [address]      Studies: Dg Chest [facility] 1 View  Result Date: 12/22/2018 CLINICAL DATA:  Productive cough and chest pain EXAM: PORTABLE CHEST 1 VIEW COMPARISON:  11/19/2018 chest CT FINDINGS: Streaky right perihilar density correlating with radiation fibrosis on prior chest CT. Generalized interstitial coarsening in this patient with COPD. There is no edema, consolidation, effusion, or pneumothorax. Normal heart size IMPRESSION: 1. COPD without acute superimposed finding. 2. 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   On: 12/22/2018 04:21    Scheduled Meds: . apixaban  2.5 mg Oral BID  . budesonide (PULMICORT) nebulizer solution  0.25 mg Nebulization BID  . carvedilol  6.25 mg Oral BID  . docusate sodium  100 mg Oral BID  . famotidine  20 mg Oral Daily  . ipratropium-albuterol  3 mL Nebulization TID  . pantoprazole  40 mg Oral Daily  . predniSONE  50 mg Oral Q breakfast  . sodium chloride flush  3 mL Intravenous Once   Continuous Infusions: . azithromycin 500 mg (12/23/18 1330)    Assessment/Plan:  1. Acute hypoxic respiratory failure.  Acute respiratory acidosis.  Patient initially on BiPAP and then tapered over to nasal cannula.  Continue nebulizer treatments and prednisone.  Blood cultures are negative MRSA PCR negative 2. COPD exacerbation.  Clinically improving but might need another day continue  nebulizer treatments and prednisone.  Patient wears oxygen as needed as home.  We will check ambulatory pulse ox 3. Acute kidney injury on chronic kidney disease.  Patient was given IV fluids.  Baseline creatinine at 1.5.  During this admission 2.5-2.17 4. Chronic systolic congestive heart failure.  Patient was given IV fluids.  Watch closely with holding diuretics 5. History of breast cancer with radiation fibrosis outpatient follow-up for surveillance  Code Status:     Code Status Orders  (From admission, onward)         Start     Ordered   12/22/18 0845  Full code  Continuous     12/22/18 0844        Code Status History  Date Active Date Inactive Code Status Order ID Comments User Context   09/28/2018 0813 10/04/2018 1522 Full Code [id number]  Herbert Pun, MD Inpatient   09/05/2018 0616 09/10/2018 1708 Full Code [id number]  Arta Silence, MD ED   08/12/2018 0540 08/16/2018 2057 Full Code [id number]  Arta Silence, MD ED   07/21/2018 0332 07/23/2018 1718 Full Code [id number]  Derrek Monaco, RN Inpatient   07/18/2018 0640 07/21/2018 0330 DNR [id number]  Harrie Foreman, MD Inpatient   07/05/2018 0808 07/09/2018 1946 DNR [id number]  Harrie Foreman, MD Inpatient   06/30/2018 1733 07/02/2018 1507 DNR [id number]  [name], MD Inpatient   06/30/2018 0019 06/30/2018 1733 Full Code [id number]  Amelia Jo, MD Inpatient   06/04/2018 0113 06/05/2018 1448 Full Code [id number]  Amelia Jo, MD Inpatient   04/21/2018 0633 05/17/2018 2214 Full Code [id number]  Arta Silence, MD Inpatient   02/09/2017 1125 02/11/2017 1706 Full Code [id number]  Henreitta Leber, MD Inpatient    Advance Directive Documentation     Most Recent Value  Type of Advance Directive  Healthcare Power of Attorney  Pre-existing out of facility DNR order (yellow form or pink MOST form)  -  "MOST" Form in Place?  -     Family Communication: As per critical care team Disposition Plan: To be  determined  Consultants:  Critical care specialist  Time spent: 33 minutes.  Case discussed with nursing staff and respiratory  Nicholes Mango  [employer]

## 2018-12-24 LAB — CBC
HCT: 24 % — ABNORMAL LOW (ref 36.0–46.0)
HEMOGLOBIN: 7.5 g/dL — AB (ref 12.0–15.0)
MCH: 29.6 pg (ref 26.0–34.0)
MCHC: 31.3 g/dL (ref 30.0–36.0)
MCV: 94.9 fL (ref 80.0–100.0)
Platelets: 213 10*3/uL (ref 150–400)
RBC: 2.53 MIL/uL — ABNORMAL LOW (ref 3.87–5.11)
RDW: 12.8 % (ref 11.5–15.5)
WBC: 6.9 10*3/uL (ref 4.0–10.5)
nRBC: 0 % (ref 0.0–0.2)

## 2018-12-24 LAB — BASIC METABOLIC PANEL
Anion gap: 5 (ref 5–15)
BUN: 30 mg/dL — ABNORMAL HIGH (ref 8–23)
CO2: 16 mmol/L — ABNORMAL LOW (ref 22–32)
Calcium: 9 mg/dL (ref 8.9–10.3)
Chloride: 116 mmol/L — ABNORMAL HIGH (ref 98–111)
Creatinine, Ser: 1.53 mg/dL — ABNORMAL HIGH (ref 0.44–1.00)
GFR calc Af Amer: 41 mL/min — ABNORMAL LOW (ref >60)
GFR calc non Af Amer: 36 mL/min — ABNORMAL LOW (ref >60)
Glucose, Bld: 127 mg/dL — ABNORMAL HIGH (ref 70–99)
Potassium: 4.6 mmol/L (ref 3.5–5.1)
Sodium: 137 mmol/L (ref 135–145)

## 2018-12-24 LAB — TROPONIN I: Troponin I: <0.03 ng/mL (ref <0.03)

## 2018-12-24 MED ORDER — FUROSEMIDE 40 MG PO TABS: 40.0000 mg | ORAL_TABLET | Freq: Every day | ORAL | 0 refills | Status: DC

## 2018-12-24 MED ORDER — ALBUTEROL SULFATE (2.5 MG/3ML) 0.083% IN NEBU: 2.5000 mg | INHALATION_SOLUTION | RESPIRATORY_TRACT | Status: DC | PRN

## 2018-12-24 MED ORDER — ALUM & MAG HYDROXIDE-SIMETH 200-200-20 MG/5ML PO SUSP: 30.0000 mL | Freq: Four times a day (QID) | ORAL | 0 refills | Status: DC | PRN

## 2018-12-24 MED ORDER — ACETAMINOPHEN 325 MG PO TABS: 650.0000 mg | ORAL_TABLET | Freq: Four times a day (QID) | ORAL | Status: AC | PRN

## 2018-12-24 MED ORDER — LOPERAMIDE HCL 2 MG PO CAPS
2.0000 mg | ORAL_CAPSULE | Freq: Three times a day (TID) | ORAL | Status: DC | PRN
  Filled 2018-12-24: qty 1

## 2018-12-24 MED ORDER — GUAIFENESIN-DM 100-10 MG/5ML PO SYRP: 5.0000 mL | ORAL_SOLUTION | ORAL | 0 refills | Status: DC | PRN

## 2018-12-24 MED ORDER — PREDNISONE 10 MG (21) PO TBPK: 10.0000 mg | ORAL_TABLET | Freq: Every day | ORAL | 0 refills | Status: DC

## 2018-12-24 MED ORDER — AZITHROMYCIN 250 MG PO TABS: ORAL_TABLET | ORAL | 0 refills | Status: AC

## 2018-12-24 MED ORDER — IPRATROPIUM-ALBUTEROL 0.5-2.5 (3) MG/3ML IN SOLN: 3.0000 mL | Freq: Four times a day (QID) | RESPIRATORY_TRACT | Status: DC

## 2018-12-24 NOTE — Progress Notes (Signed)
Patient is alert and oriented and able to verbalize needs. No complaints of pain at this time. VSS. PIV removed. Printed AVS and scripts for prednisone, azithromycin and lasix given to patient in discharge packet. No concerns voiced at this time. NT escorted patient to car via wc. Daughter will transport pt home.   Bethann Punches, RN

## 2018-12-24 NOTE — Discharge Instructions (Signed)
Follow-up with primary care physician in 3 to 4 days Follow-up with primary oncology for breast cancer surveillance Follow-up with gastroenterology Dr. Vicente Males in 1 to 2 weeks

## 2018-12-24 NOTE — Progress Notes (Signed)
Pt complaint of pain mid-sternum. Hospitalist notified. EKG completed and GI [place] given to pt as ordered. Pt continues to have coughing intermittently with little production. Respiratory treatment also given. Pt improved. Resting at this time. PDowless, RN 12/23/2018

## 2018-12-24 NOTE — Discharge Summary (Signed)
[place] [facility] [place] NAME: Danielle [name]    MR#:  [id number]  DATE OF BIRTH:  January 18, 1956  DATE OF ADMISSION:  12/22/2018 ADMITTING PHYSICIAN: Harrie Foreman, MD  DATE OF DISCHARGE:  12/24/18   PRIMARY CARE PHYSICIAN: Elisabeth Cara, NP    ADMISSION DIAGNOSIS:  Cough [R05] SOB (shortness of breath) [R06.02] Acute respiratory failure with hypoxia (HCC) [J96.01]  DISCHARGE DIAGNOSIS:  Acute hypoxic respiratory failure COPD exacerbation SECONDARY DIAGNOSIS:   Past Medical History:  Diagnosis Date  . Anxiety   . Arthritis   . Breast cancer ([place]) 2000  . Cancer ([place]) left    breast cancer 2000, chemo tx's with total mastectomy and lymph nodes resected.   . Cancer of right lung ([place]) 02/21/2016   rad tx's.   . CHF (congestive heart failure) ([place])   . COPD (chronic obstructive pulmonary disease) ([place])   . Dependence on supplemental oxygen   . Depression   . Diabetes mellitus without complication ([place])   . Heart murmur   . Hypertension   . Lung nodule   . Lymphedema   . Personal history of chemotherapy   . Personal history of radiation therapy   . Shortness of breath dyspnea    with exertion  . Status post chemotherapy 2001   left breast cancer  . Status post radiation therapy 2001   left breast cancer    HOSPITAL COURSE:  HPI: The patient with past medical history of breast cancer and lung cancer status post radiation and chemotherapy; CHF and COPD presents to the emergency department complaining of shortness of breath.  The patient states that she began to have a severe cough 3 days ago with accompanying dyspnea that waxed and waned in severity.  She has been using her inhaler without relief.  Upon arrival the patient required BiPAP and was given Solu-Medrol prior to the emergency department staff calling the hospitalist service for admission.   1. Acute hypoxic respiratory failure.  Acute respiratory acidosis.  Patient  initially on BiPAP and then tapered over to nasal cannula.  Now weaned off to room air Continue nebulizer treatments and prednisone.  Blood cultures are negative MRSA PCR negative 2. COPD exacerbation.  Clinically improving discharge patient home with p.o. tapering dose of prednisone and inhalers Patient wears oxygen as needed as home.  Continue oxygen via nasal cannula as needed 3. Acute kidney injury on chronic kidney disease.  Patient was given IV fluids.  Baseline creatinine at 1.5.  During this admission 2.5-2.17 -1.5  4. Chronic systolic congestive heart failure.  Patient was given IV fluids.    Not fluid overloaded continue home medications including diuretics 5. History of breast cancer with radiation fibrosis outpatient follow-up for surveillance 6. Dysphagia-continue Protonix patient is tolerating diet but reporting sticky sensation of the food in the esophagus area.  Prefers following with GI as an outpatient.  Appointment will be made GI in 1 to 2 weeks Discharge patient home patient is agreeable  DISCHARGE CONDITIONS:   Stable  CONSULTS OBTAINED:     PROCEDURES  NONE   DRUG ALLERGIES:  No Known Allergies  DISCHARGE MEDICATIONS:   Allergies as of 12/24/2018   No Known Allergies     Medication List    STOP taking these medications   megestrol 400 MG/10ML suspension Commonly known as:  MEGACE     TAKE these medications   acetaminophen 325 MG tablet Commonly known as:  TYLENOL Take 2 tablets (  650 mg total) by mouth every 6 (six) hours as needed for mild pain (or Fever >/= 101).   albuterol 108 (90 Base) MCG/ACT inhaler Commonly known as:  PROVENTIL HFA;VENTOLIN HFA Inhale 2 puffs into the lungs every 6 (six) hours as needed for wheezing or shortness of breath.   alum & mag hydroxide-simeth 200-200-20 MG/5ML suspension Commonly known as:  MAALOX/MYLANTA Take 30 mLs by mouth every 6 (six) hours as needed for indigestion or heartburn.   apixaban 5 MG Tabs  tablet Commonly known as:  ELIQUIS Take 1 tablet (5 mg total) by mouth 2 (two) times daily.   azithromycin 250 MG tablet Commonly known as:  Zithromax Z-Pak Take 2 tablets (500 mg) on  Day 1,  followed by 1 tablet (250 mg) once daily on Days 2 through 5.   budesonide-formoterol 160-4.5 MCG/ACT inhaler Commonly known as:  SYMBICORT Inhale 2 puffs into the lungs 2 (two) times daily.   carvedilol 6.25 MG tablet Commonly known as:  COREG Take 1 tablet (6.25 mg total) by mouth 2 (two) times daily.   citalopram 40 MG tablet Commonly known as:  CELEXA Take 40 mg by mouth daily.   ferrous sulfate 325 (65 FE) MG tablet Take 1 tablet (325 mg total) by mouth 2 (two) times daily with a meal.   furosemide 40 MG tablet Commonly known as:  LASIX Take 1 tablet (40 mg total) by mouth daily. What changed:  when to take this   guaiFENesin-dextromethorphan 100-10 MG/5ML syrup Commonly known as:  ROBITUSSIN DM Take 5 mLs by mouth every 4 (four) hours as needed for cough (chest congestion).   loperamide 2 MG tablet Commonly known as:  Imodium A-D Take 1 tablet (2 mg total) by mouth as needed for diarrhea or loose stools.   multivitamin with minerals Tabs tablet Take 1 tablet by mouth daily.   pantoprazole 40 MG tablet Commonly known as:  Protonix Take 1 tablet (40 mg total) by mouth 2 (two) times daily before a meal.   potassium chloride 10 MEQ tablet Commonly known as:  K-DUR Take 10 mEq by mouth daily.   predniSONE 10 MG (21) Tbpk tablet Commonly known as:  STERAPRED UNI-PAK 21 TAB Take 1 tablet (10 mg total) by mouth daily. Take 6 tablets by mouth for 1 day followed by  5 tablets by mouth for 1 day followed by  4 tablets by mouth for 1 day followed by  3 tablets by mouth for 1 day followed by  2 tablets by mouth for 1 day followed by  1 tablet by mouth for a day and stop        DISCHARGE INSTRUCTIONS:   Follow-up with primary care physician in 3 to 4 days Follow-up with  primary oncology for breast cancer surveillance Follow-up with gastroenterology Dr. Vicente Males in 1 to 2 weeks  DIET:  Cardiac diet  DISCHARGE CONDITION:  Stable  ACTIVITY:  Activity as tolerated  OXYGEN:  Home Oxygen: Yes.     Oxygen Delivery: 2 liters/min via As needed  DISCHARGE LOCATION:  home   If you experience worsening of your admission symptoms, develop shortness of breath, life threatening emergency, suicidal or homicidal thoughts you must seek medical attention immediately by calling 911 or calling your MD immediately  if symptoms less severe.  You Must read complete instructions/literature along with all the possible adverse reactions/side effects for all the Medicines you take and that have been prescribed to you. Take any new Medicines after you have  completely understood and accpet all the possible adverse reactions/side effects.   Please note  You were cared for by a hospitalist during your hospital stay. If you have any questions about your discharge medications or the care you received while you were in the hospital after you are discharged, you can call the unit and asked to speak with the hospitalist on call if the hospitalist that took care of you is not available. Once you are discharged, your primary care physician will handle any further medical issues. Please note that NO REFILLS for any discharge medications will be authorized once you are discharged, as it is imperative that you return to your primary care physician (or establish a relationship with a primary care physician if you do not have one) for your aftercare needs so that they can reassess your need for medications and monitor your lab values.     Today  Chief Complaint  Patient presents with  . Shortness of Breath   Patient is feeling much better breathing is improved weaned off oxygen to room air patient has oxygen at home to use as needed.  Cough is improving  ROS:  CONSTITUTIONAL: Denies fevers,  chills. Denies any fatigue, weakness.  EYES: Denies blurry vision, double vision, eye pain. EARS, NOSE, THROAT: Denies tinnitus, ear pain, hearing loss. RESPIRATORY: Intermittent cough, denies wheeze, shortness of breath.  CARDIOVASCULAR: Denies chest pain, palpitations, edema.  GASTROINTESTINAL: Denies nausea, vomiting, diarrhea, abdominal pain. Denies bright red blood per rectum. GENITOURINARY: Denies dysuria, hematuria. ENDOCRINE: Denies nocturia or thyroid problems. HEMATOLOGIC AND LYMPHATIC: Denies easy bruising or bleeding. SKIN: Denies rash or lesion. MUSCULOSKELETAL: Denies pain in neck, back, shoulder, knees, hips or arthritic symptoms.  NEUROLOGIC: Denies paralysis, paresthesias.  PSYCHIATRIC: Denies anxiety or depressive symptoms.   VITAL SIGNS:  Blood pressure 116/75, pulse 89, temperature 98.1 F (36.7 C), temperature source Oral, resp. rate 18, height 5\' 7"  (1.702 m), weight 59 kg, SpO2 96 %.  I/O:    Intake/Output Summary (Last 24 hours) at 12/24/2018 1254 Last data filed at 12/24/2018 0900 Gross per 24 hour  Intake 1247.18 ml  Output -  Net 1247.18 ml    PHYSICAL EXAMINATION:  GENERAL:  63 y.o.-year-old patient lying in the bed with no acute distress.  EYES: Pupils equal, round, reactive to light and accommodation. No scleral icterus. Extraocular muscles intact.  HEENT: Head atraumatic, normocephalic. Oropharynx and nasopharynx clear.  NECK:  Supple, no jugular venous distention. No thyroid enlargement, no tenderness.  LUNGS: Moderate breath sounds bilaterally, no wheezing, rales,rhonchi or crepitation. No use of accessory muscles of respiration.  CARDIOVASCULAR: S1, S2 normal. No murmurs, rubs, or gallops.  ABDOMEN: Soft, non-tender, non-distended. Bowel sounds present.  EXTREMITIES: No pedal edema, cyanosis, or clubbing.  NEUROLOGIC: Awake alert and oriented x3 sensation intact. Gait not checked.  PSYCHIATRIC: The patient is alert and oriented x 3.  SKIN: No  obvious rash, lesion, or ulcer.   DATA REVIEW:   CBC Recent Labs  Lab 12/24/18 0428  WBC 6.9  HGB 7.5*  HCT 24.0*  PLT 213    Chemistries  Recent Labs  Lab 12/24/18 0428  NA 137  K 4.6  CL 116*  CO2 16*  GLUCOSE 127*  BUN 30*  CREATININE 1.53*  CALCIUM 9.0    Cardiac Enzymes Recent Labs  Lab 12/24/18 0428  TROPONINI <0.03    Microbiology Results  Results for orders placed or performed during the hospital encounter of 12/22/18  Blood culture (routine x 2)  Status: None (Preliminary result)   Collection Time: 12/22/18  4:37 AM  Result Value Ref Range Status   Specimen Description BLOOD R AC  Final   Special Requests   Final    BOTTLES DRAWN AEROBIC AND ANAEROBIC Blood Culture results may not be optimal due to an excessive volume of blood received in culture bottles   Culture   Final    NO GROWTH 2 DAYS Performed at [facility], [address] [place], [place] [address]    Report Status PENDING  Incomplete  Blood culture (routine x 2)     Status: None (Preliminary result)   Collection Time: 12/22/18  4:37 AM  Result Value Ref Range Status   Specimen Description BLOOD R HAND  Final   Special Requests   Final    BOTTLES DRAWN AEROBIC AND ANAEROBIC Blood Culture adequate volume   Culture   Final    NO GROWTH 2 DAYS Performed at [facility], [address], [place], [place] [address]    Report Status PENDING  Incomplete  MRSA PCR Screening     Status: None   Collection Time: 12/22/18  8:41 AM  Result Value Ref Range Status   MRSA by PCR NEGATIVE NEGATIVE Final    Comment:        The GeneXpert MRSA Assay (FDA approved for NASAL specimens only), is one component of a comprehensive MRSA colonization surveillance program. It is not intended to diagnose MRSA infection nor to guide or monitor treatment for MRSA infections. Performed at [facility], [address] [place], [place] [address]     RADIOLOGY:  Dg Chest  Port 1 View  Result Date: 12/22/2018 CLINICAL DATA:  Productive cough and chest pain EXAM: PORTABLE CHEST 1 VIEW COMPARISON:  11/19/2018 chest CT FINDINGS: Streaky right perihilar density correlating with radiation fibrosis on prior chest CT. Generalized interstitial coarsening in this patient with COPD. There is no edema, consolidation, effusion, or pneumothorax. Normal heart size IMPRESSION: 1. COPD without acute superimposed finding. 2. 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   On: 12/22/2018 04:21    EKG:   Orders placed or performed during the hospital encounter of 12/22/18  . EKG 12-Lead  . EKG 12-Lead  . ED EKG  . ED EKG  . ED EKG  . ED EKG  . ED EKG 12-Lead  . ED EKG 12-Lead  . EKG 12-Lead  . EKG 12-Lead      Management plans discussed with the patient, she is in agreement.  CODE STATUS:     Code Status Orders  (From admission, onward)         Start     Ordered   12/22/18 0845  Full code  Continuous     12/22/18 0844        Code Status History    Date Active Date Inactive Code Status Order ID Comments User Context   09/28/2018 0813 10/04/2018 1522 Full Code [id number]  Herbert Pun, MD Inpatient   09/05/2018 0616 09/10/2018 1708 Full Code [id number]  Arta Silence, MD ED   08/12/2018 0540 08/16/2018 2057 Full Code [id number]  Arta Silence, MD ED   07/21/2018 0332 07/23/2018 1718 Full Code [id number]  Derrek Monaco, RN Inpatient   07/18/2018 0640 07/21/2018 0330 DNR [id number]  Harrie Foreman, MD Inpatient   07/05/2018 0808 07/09/2018 1946 DNR [id number]  Harrie Foreman, MD Inpatient   06/30/2018 1733 07/02/2018  [place] DNR [id number]  [name], MD Inpatient   06/30/2018 0019 06/30/2018 1733 Full Code [id number]  Amelia Jo, MD Inpatient   06/04/2018 0113 06/05/2018 1448 Full Code [id number]  Amelia Jo, MD Inpatient   04/21/2018 [phone] 05/17/2018 2214 Full Code  [id number]  Arta Silence, MD Inpatient   02/09/2017 1125 02/11/2017 1706 Full Code [id number]  Henreitta Leber, MD Inpatient    Advance Directive Documentation     Most Recent Value  Type of Advance Directive  Healthcare Power of Attorney  Pre-existing out of facility DNR order (yellow form or pink MOST form)  -  "MOST" Form in Place?  -      TOTAL TIME TAKING CARE OF THIS PATIENT: 45  minutes.   Note: This dictation was prepared with Dragon dictation along with smaller phrase technology. Any transcriptional errors that result from this process are unintentional.   @[facility] @  on 12/24/2018 at 12:54 PM  Between 7am to 6pm - Pager - [phone]  After 6pm go to [email or web address] - password [email or web address] [facility]  [facility]  Office  [phone]  CC: Primary care physician; Elisabeth Cara, NP

## 2018-12-27 LAB — CULTURE, BLOOD (ROUTINE X 2)
Culture: NO GROWTH
Culture: NO GROWTH
SPECIAL REQUESTS: ADEQUATE

## 2018-12-29 ENCOUNTER — Ambulatory Visit: Payer: Medicaid Other | Admitting: Internal Medicine

## 2018-12-29 ENCOUNTER — Telehealth: Payer: Self-pay | Admitting: Internal Medicine

## 2018-12-29 NOTE — Telephone Encounter (Signed)
Spoke to pt's daughter re: CT scan results-STABLE.  Please re-schedule to Follow up in 2 months- MD/cbc/cmp-Dr.B

## 2018-12-30 ENCOUNTER — Inpatient Hospital Stay: Payer: Medicaid Other | Admitting: Internal Medicine

## 2018-12-30 NOTE — Telephone Encounter (Signed)
Apt has been made and lab orders are already on chart.

## 2019-01-01 ENCOUNTER — Ambulatory Visit: Payer: Medicaid Other | Admitting: Gastroenterology

## 2019-01-28 IMAGING — CT CT CHEST W/ CM
1 series · 15 of 34 positions shown, 19 images · IV contrast (iopamidol)
Comparison: Plain film 04/19/2017.  CT 03/01/2017.

CLINICAL DATA: Restaging of right upper lobe lung cancer diagnosed
[DATE]. Radiation therapy. Cough. Syncope. Left breast cancer with
chemotherapy and mastectomy 18 years ago. Ex-smoker, quitting 4
years ago.

EXAM:
CT CHEST WITH CONTRAST
TECHNIQUE: Multidetector CT imaging of the chest was performed during
intravenous contrast administration.
CONTRAST:  75mL [id number] IOPAMIDOL (FHVP9N-J88) INJECTION 61%

[Series 2: axial st · axial · 0.67mm/px · z∈[-535,-279]mm · 15 of 152 slices shown, 19 images]
[im 12/152  mediastinal]
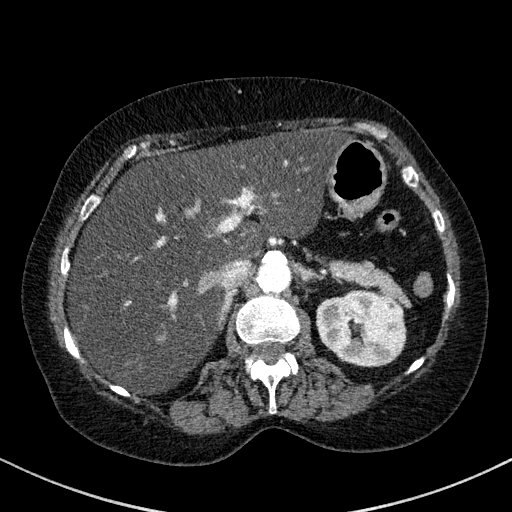
[im 12/152  lung]
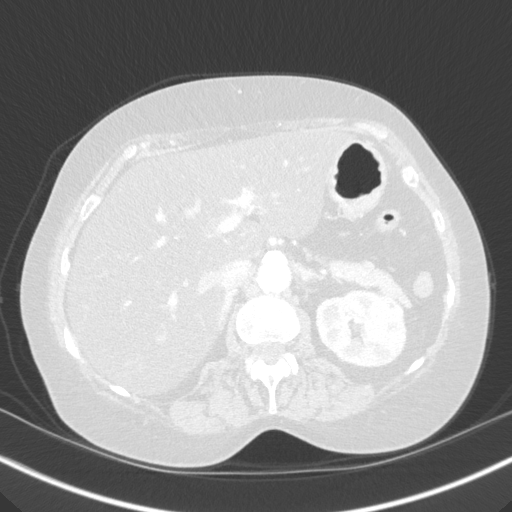
[im 23/152  lung]
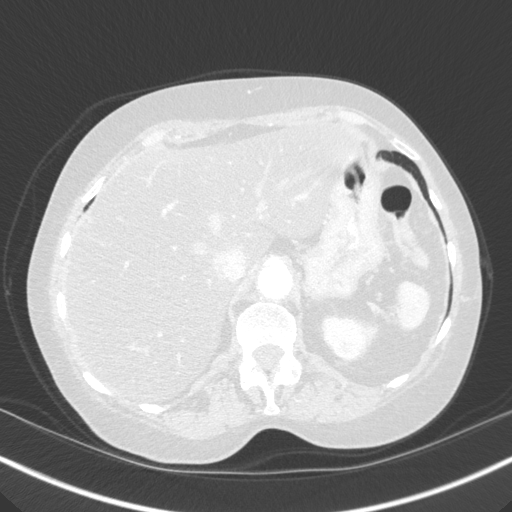
[im 31/152  lung]
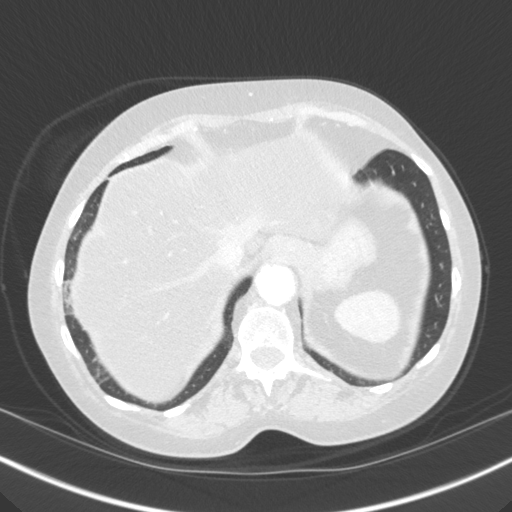
[im 40/152  lung]
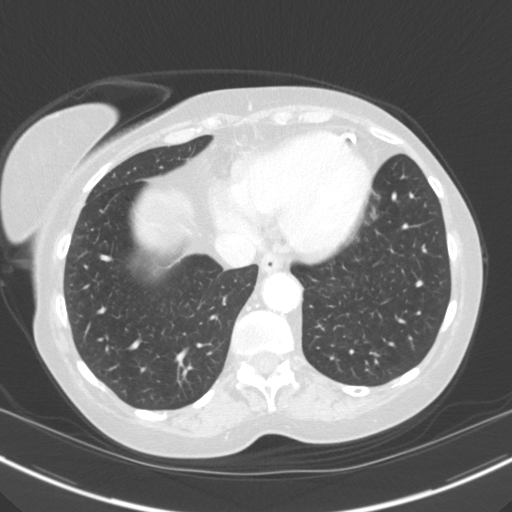
[im 51/152  mediastinal]
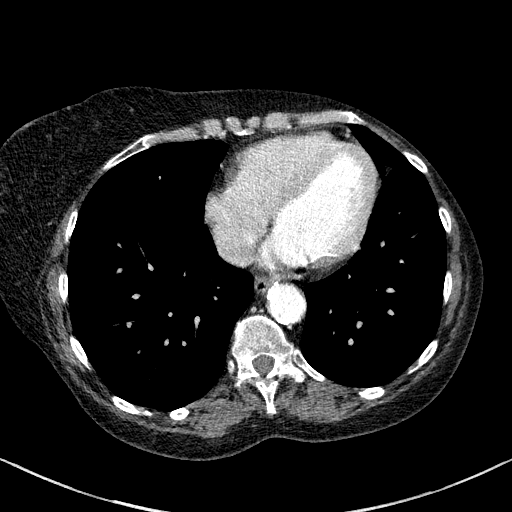
[im 51/152  lung]
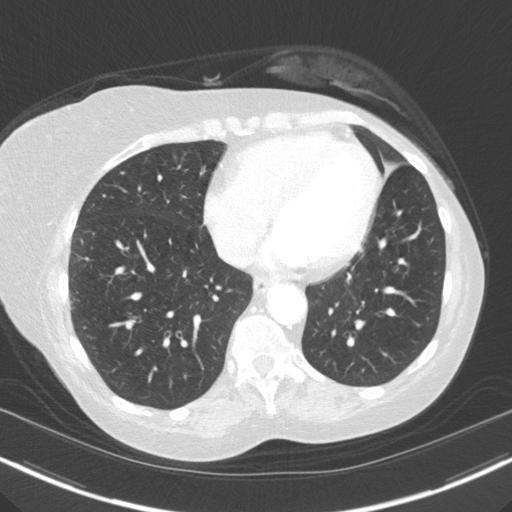
[im 61/152  lung]
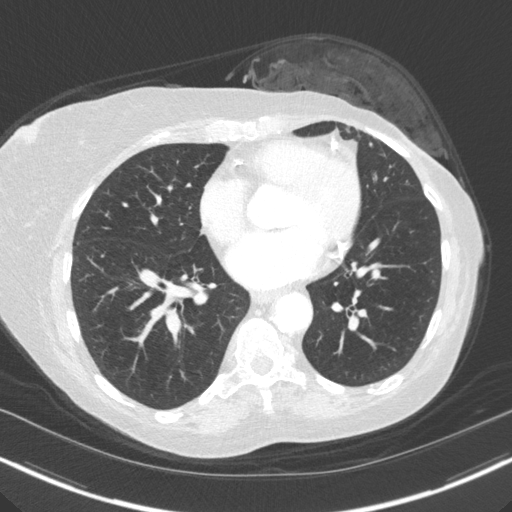
[im 68/152  lung]
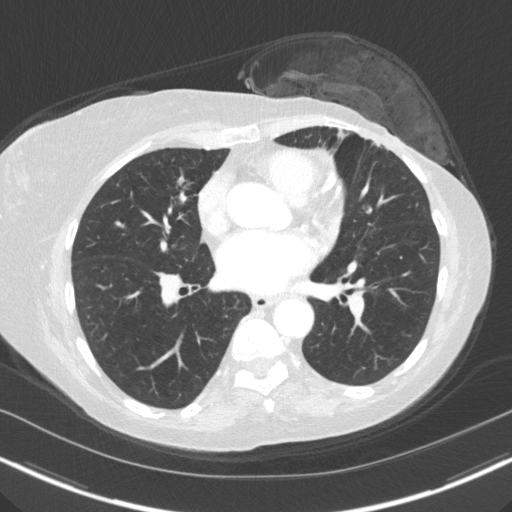
[im 79/152  lung]
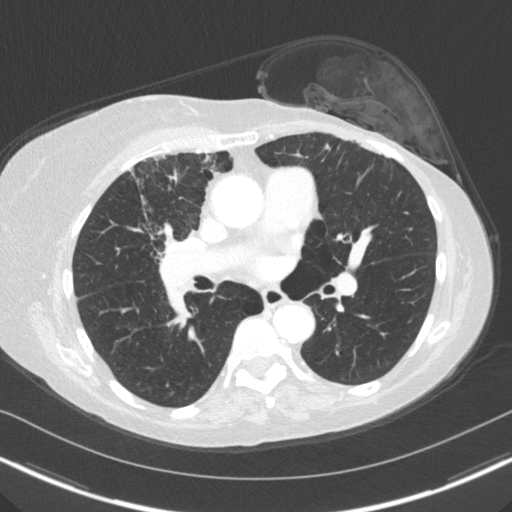
[im 84/152  mediastinal]
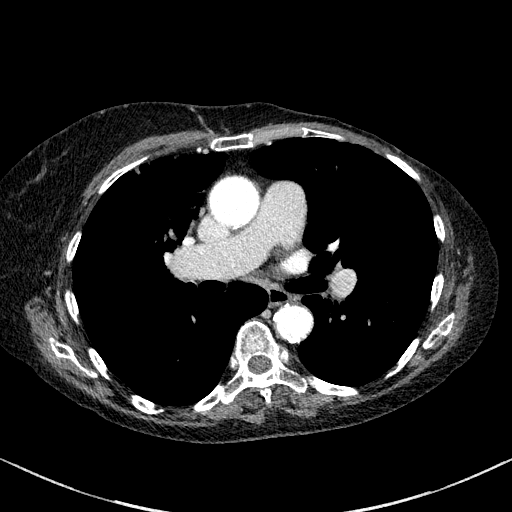
[im 84/152  lung]
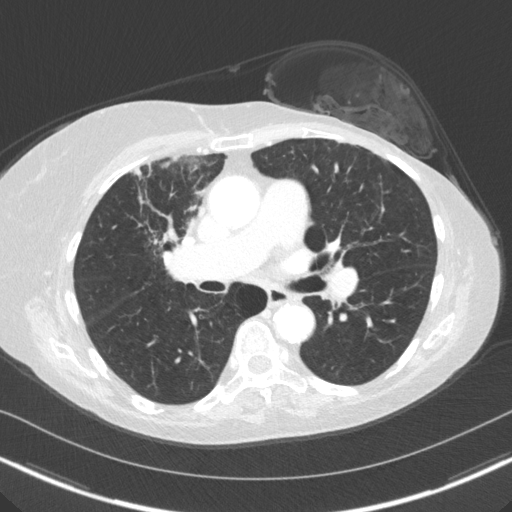
[im 91/152  lung]
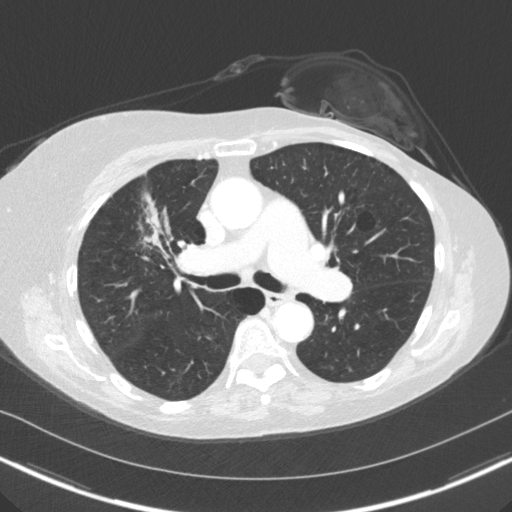
[im 101/152  lung]
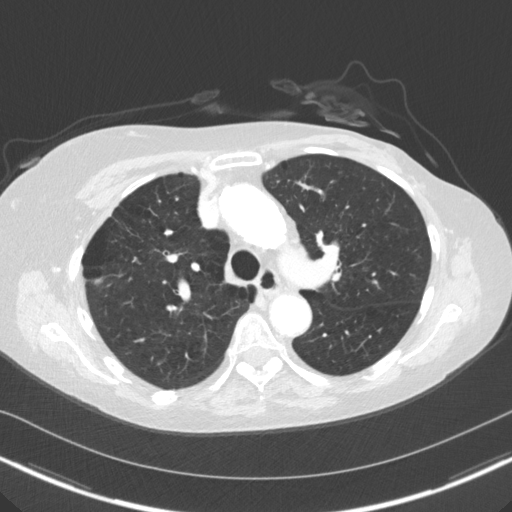
[im 112/152  lung]
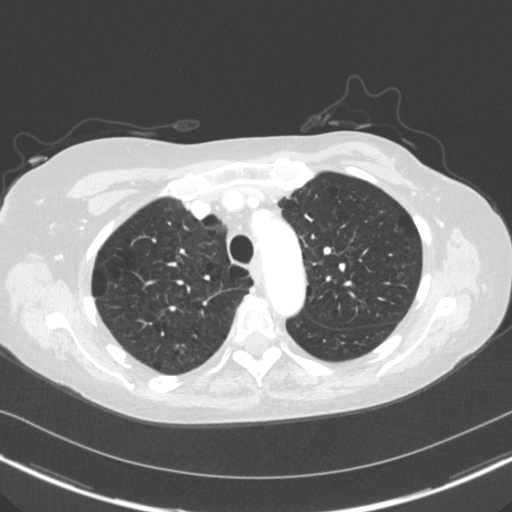
[im 121/152  mediastinal]
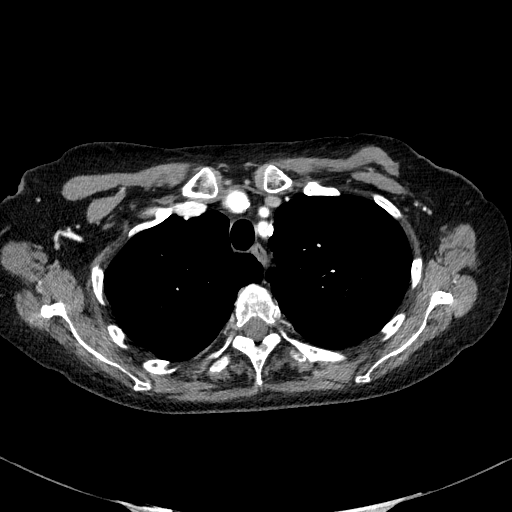
[im 121/152  lung]
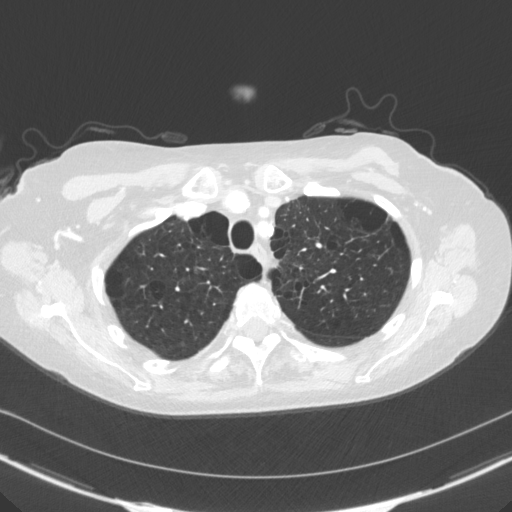
[im 129/152  lung]
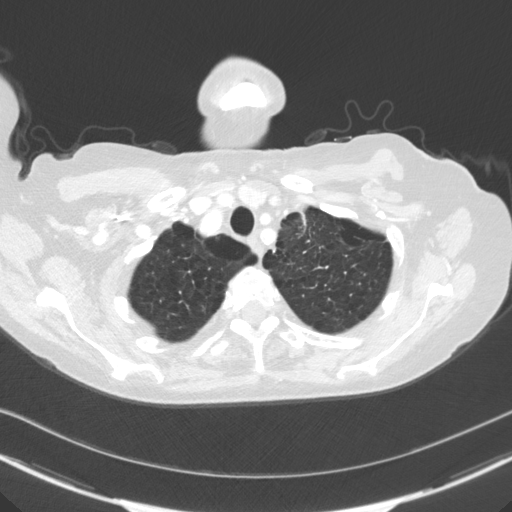
[im 140/152  lung]
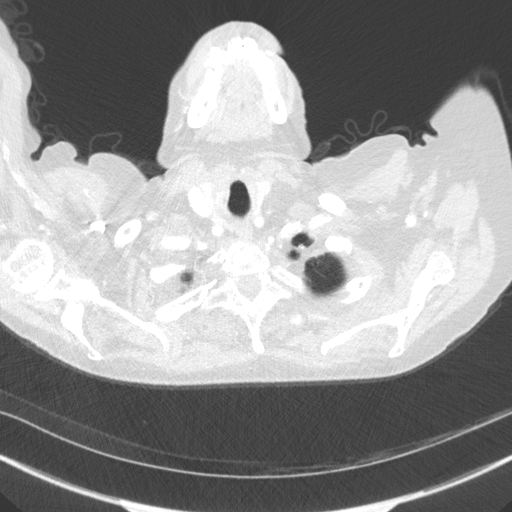

[15 of 34 positions shown; findings below may reference images not displayed]

FINDINGS: Cardiovascular: Advanced aortic and branch vessel atherosclerosis.
Tortuous thoracic aorta. Normal heart size, without pericardial
effusion. Multivessel coronary artery atherosclerosis. No central
pulmonary embolism, on this non-dedicated study. Pulmonary artery
enlargement, outflow tract 3.1 cm.

Mediastinum/Nodes: No axillary adenopathy. No mediastinal or hilar
adenopathy.

Lungs/Pleura: No pleural fluid. Moderate centrilobular and
paraseptal emphysema.

Mixed attenuation "Lesion" with within the inferior right upper lobe
measures 4.4 x 3.6 cm today versus 5.1 x 3.8 cm on the prior. This
is at least partially felt to be due to radiation change. Along its
inferior portion, is increased soft tissue density, including on
image 60/series 3.

Contiguous septal thickening within the superior right middle lobe,
increased.

Upper Abdomen: Marked hepatic steatosis. Normal imaged portions of
the spleen, stomach, pancreas, adrenal glands. Upper pole left renal
cyst and interpolar left renal too small to characterize lesion.
Left mastectomy.

Musculoskeletal: No acute osseous abnormality.
IMPRESSION: 1. The previously measured mixed attenuation lesion in the right
upper lobe is slightly less masslike today. However, within the more
inferior right upper lobe and adjacent right middle lobe, there is
increased soft tissue density and septal thickening. All felt to
most likely be radiation induced. Recommend special attention to the
area of soft tissue thickening within the inferior right upper lobe.
2. No thoracic adenopathy.
3.  Aortic Atherosclerosis ([id number].B).
4. Age advanced coronary artery atherosclerosis. Recommend
assessment of coronary risk factors and consideration of medical
therapy.
5. Hepatic steatosis.
6.  Emphysema ([id number].N).

## 2019-01-30 ENCOUNTER — Ambulatory Visit (INDEPENDENT_AMBULATORY_CARE_PROVIDER_SITE_OTHER): Payer: Medicaid Other | Admitting: Gastroenterology

## 2019-01-30 ENCOUNTER — Telehealth: Payer: Self-pay

## 2019-01-30 DIAGNOSIS — R1310 Dysphagia, unspecified: Secondary | ICD-10-CM | POA: Diagnosis not present

## 2019-01-30 MED ORDER — SUCRALFATE 1 GM/10ML PO SUSP: 1.0000 g | Freq: Four times a day (QID) | ORAL | 1 refills | Status: DC

## 2019-01-30 NOTE — Progress Notes (Signed)
Danielle [name]  [address]  [place]  [place], [place] [address]  Main: [phone]  Fax: [phone]   Gastroenterology Consultation  Referring Provider:     Elisabeth Cara, NP Primary Care Physician:  Elisabeth Cara, NP Reason for Consultation:     Dysphagia         HPI:   Virtual Visit via video  Note  I connected with patient on 01/30/19 at  9:30 AM EDT by video  and verified that I am speaking with the correct person using two identifiers.   I discussed the limitations, risks, security and privacy concerns of performing an evaluation and management service by video and the availability of in person appointments. I also discussed with the patient that there may be a patient responsible charge related to this service. The patient expressed understanding and agreed to proceed.  Location of the patient: Home Location of provider: Home Participating persons: Patient and provider only   History of Present Illness: Chief Complaint  Patient presents with  . Hospitalization Follow-up    Dysphagia     Danielle [name] is a 63 y.o. y/o female referred for consultation & management  by Dr. Elisabeth Cara, NP.    She has a history of lung cancer diagnosed in 2016 , breast cancer in 2000. No recurrence of disease.  She was admitted on 07/18/2018 with small bowel obstruction.  AKI.  Dr. Peyton Najjar performed an ex lap and end ileostomy about 3 months back for bleeding of the hepatic flexure.  Subsequently in 07/04/2018 she was admitted with small bowel obstruction.  In July 2019 she had a CT scan of the abdomen which showed thickening in the ascending colon.  Colonoscopy showed large circumferential ulceration of the hepatic flexure.  He subsequently had a right hemicolectomy.  Culture report showed no evidence of malignancy and it appeared ischemic.  She had an upper endoscopy on 05/07/2018 which was performed for hematochezia and the EGD showed superficial esophageal tear,  esophagitis and was commenced on a PPI.  CT chest 11/19/2018 shows post radiation changes in rt parahilar lung   She denies any difficulty swallowing, her main issue is food feels like it sticks at times in her chest and causes heartburn. This has been ongoing on since her last hospital admission. Denies food ever getting stuck hat she needs to thrown up.  She takes protonix- She takes it twice a day . After meals.     Past Medical History:  Diagnosis Date  . Anxiety   . Arthritis   . Breast cancer ([place]) 2000  . Cancer ([place]) left    breast cancer 2000, chemo tx's with total mastectomy and lymph nodes resected.   . Cancer of right lung ([place]) 02/21/2016   rad tx's.   . CHF (congestive heart failure) ([place])   . COPD (chronic obstructive pulmonary disease) ([place])   . Dependence on supplemental oxygen   . Depression   . Diabetes mellitus without complication ([place])   . Heart murmur   . Hypertension   . Lung nodule   . Lymphedema   . Personal history of chemotherapy   . Personal history of radiation therapy   . Shortness of breath dyspnea    with exertion  . Status post chemotherapy 2001   left breast cancer  . Status post radiation therapy 2001   left breast cancer    Past Surgical History:  Procedure Laterality Date  . Breast Biospy Left    [facility]  .  BREAST SURGERY    . COLONOSCOPY N/A 04/30/2018   Procedure: COLONOSCOPY;  Surgeon: Virgel Manifold, MD;  Location: [facility] ENDOSCOPY;  Service: Endoscopy;  Laterality: N/A;  . COLONOSCOPY N/A 07/22/2018   Procedure: COLONOSCOPY;  Surgeon: Virgel Manifold, MD;  Location: [facility] ENDOSCOPY;  Service: Endoscopy;  Laterality: N/A;  . DILATION AND CURETTAGE OF UTERUS    . ELECTROMAGNETIC NAVIGATION BROCHOSCOPY Right 04/11/2016   Procedure: ELECTROMAGNETIC NAVIGATION BRONCHOSCOPY;  Surgeon: Vilinda Boehringer, MD;  Location: [facility] ORS;  Service: Cardiopulmonary;  Laterality: Right;  . ESOPHAGOGASTRODUODENOSCOPY N/A 07/22/2018   Procedure:  ESOPHAGOGASTRODUODENOSCOPY (EGD);  Surgeon: Virgel Manifold, MD;  Location[facility] ENDOSCOPY;  Service: Endoscopy;  Laterality: N/A;  . ESOPHAGOGASTRODUODENOSCOPY (EGD) WITH PROPOFOL N/A 05/07/2018   Procedure: ESOPHAGOGASTRODUODENOSCOPY (EGD) WITH PROPOFOL;  Surgeon: Lucilla Lame, MD;  Location: [facility] ENDOSCOPY;  Service: Endoscopy;  Laterality: N/A;  . history of colonoscopy]    . ILEOSCOPY N/A 07/22/2018   Procedure: ILEOSCOPY THROUGH STOMA;  Surgeon: Virgel Manifold, MD;  Location: [facility] ENDOSCOPY;  Service: Endoscopy;  Laterality: N/A;  . ILEOSTOMY    . ILEOSTOMY N/A 09/08/2018   Procedure: ILEOSTOMY REVISION POSSIBLE CREATION;  Surgeon: Herbert Pun, MD;  Location: [facility] ORS;  Service: General;  Laterality: N/A;  . ILEOSTOMY CLOSURE N/A 08/15/2018   Procedure: DILATION OF ILEOSTOMY STRICTURE;  Surgeon: Herbert Pun, MD;  Location: [facility] ORS;  Service: General;  Laterality: N/A;  . LAPAROTOMY Right 05/04/2018   Procedure: EXPLORATORY LAPAROTOMY right colectomy right and left ostomy;  Surgeon: Herbert Pun, MD;  Location: [facility] ORS;  Service: General;  Laterality: Right;  . LUNG BIOPSY    . MASTECTOMY Left    2000, [facility]  . ROTATOR CUFF REPAIR Right    [facility]    Prior to Admission medications   Medication Sig Start Date End Date Taking? Authorizing Provider  acetaminophen (TYLENOL) 325 MG tablet Take 2 tablets (650 mg total) by mouth every 6 (six) hours as needed for mild pain (or Fever >/= 101). 12/24/18  Yes Gouru, Illene Silver, MD  albuterol (PROVENTIL HFA;VENTOLIN HFA) 108 (90 Base) MCG/ACT inhaler Inhale 2 puffs into the lungs every 6 (six) hours as needed for wheezing or shortness of breath. 04/24/18  Yes Demetrios Loll, MD  apixaban (ELIQUIS) 5 MG TABS tablet Take 1 tablet (5 mg total) by mouth 2 (two) times daily. 06/23/18  Yes [name], MD  carvedilol (COREG) 6.25 MG tablet Take 1 tablet (6.25 mg total) by mouth 2 (two) times daily. 06/24/18  Yes Hackney,  Tina A, FNP  citalopram (CELEXA) 40 MG tablet Take 40 mg by mouth daily.   Yes [provider]  ferrous sulfate 325 (65 FE) MG tablet Take 1 tablet (325 mg total) by mouth 2 (two) times daily with a meal. 07/02/18  Yes [name], MD  Fluticasone-Umeclidin-Vilant 100-62.5-25 MCG/INH AEPB Inhale into the lungs. 01/07/19  Yes [provider]  furosemide (LASIX) 40 MG tablet Take 1 tablet (40 mg total) by mouth daily. 12/24/18  Yes Gouru, Illene Silver, MD  loperamide (IMODIUM A-D) 2 MG tablet Take 1 tablet (2 mg total) by mouth as needed for diarrhea or loose stools. 10/04/18  Yes Herbert Pun, MD  Multiple Vitamin (MULTIVITAMIN WITH MINERALS) TABS tablet Take 1 tablet by mouth daily. 06/06/18  Yes Fritzi Mandes, MD  pantoprazole (PROTONIX) 40 MG tablet Take 1 tablet (40 mg total) by mouth 2 (two) times daily before a meal. 07/23/18  Yes Sudini, Alveta Heimlich, MD  potassium chloride (K-DUR) 10 MEQ tablet  Take 10 mEq by mouth daily.   Yes [provider]  alum & mag hydroxide-simeth (MAALOX/MYLANTA) 200-200-20 MG/5ML suspension Take 30 mLs by mouth every 6 (six) hours as needed for indigestion or heartburn. Patient not taking: Reported on 01/30/2019 12/24/18   Nicholes Mango, MD  budesonide-formoterol [facility]) 160-4.5 MCG/ACT inhaler Inhale 2 puffs into the lungs 2 (two) times daily. Patient not taking: Reported on 01/30/2019 04/24/18   Demetrios Loll, MD  guaiFENesin-dextromethorphan [facility] DM) 100-10 MG/5ML syrup Take 5 mLs by mouth every 4 (four) hours as needed for cough (chest congestion). Patient not taking: Reported on 01/30/2019 12/24/18   Nicholes Mango, MD  predniSONE (STERAPRED UNI-PAK 21 TAB) 10 MG (21) TBPK tablet Take 1 tablet (10 mg total) by mouth daily. Take 6 tablets by mouth for 1 day followed by  5 tablets by mouth for 1 day followed by  4 tablets by mouth for 1 day followed by  3 tablets by mouth for 1 day followed by  2 tablets by mouth for 1 day followed by   1 tablet by mouth for a day and stop Patient not taking: Reported on 01/30/2019 12/24/18   Nicholes Mango, MD    Family History  Problem Relation Age of Onset  . Breast cancer Mother 68  . Cancer Mother        Breast   . Cirrhosis Father   . Breast cancer Paternal Aunt 76  . Cancer Maternal Aunt        Breast      Social History   Tobacco Use  . Smoking status: Former Smoker    Packs/day: 0.50    Years: 20.00    Pack years: 10.00    Types: Cigarettes    Last attempt to quit: 07/02/2012    Years since quitting: 6.5  . Smokeless tobacco: Current User    Types: Snuff  . Tobacco comment: quit 2014  Substance Use Topics  . Alcohol use: Not Currently    Comment: Occasionally  . Drug use: No    Allergies as of 01/30/2019  . (No Known Allergies)    Review of Systems:    All systems reviewed and negative except where noted in HPI. General Appearance:    Alert, cooperative, no distress, appears stated age  Head:    Normocephalic, without obvious abnormality, atraumatic  Eyes:    PERRL, conjunctiva/corneas clear,  Ears:    Grossly normal hearing    Neurologic:   Grossly appears normal     Observations/Objective:  Labs: CBC    Component Value Date/Time   WBC 6.9 12/24/2018 0428   RBC 2.53 (L) 12/24/2018 0428   HGB 7.5 (L) 12/24/2018 0428   HGB 11.5 (L) 02/09/2015 1100   HCT 24.0 (L) 12/24/2018 0428   HCT 34.9 (L) 02/09/2015 1100   PLT 213 12/24/2018 0428   PLT 249 02/09/2015 1100   MCV 94.9 12/24/2018 0428   MCV 92 02/09/2015 1100   MCH 29.6 12/24/2018 0428   MCHC 31.3 12/24/2018 0428   RDW 12.8 12/24/2018 0428   RDW 15.5 (H) 02/09/2015 1100   LYMPHSABS 2.8 09/22/2018 1019   LYMPHSABS 1.8 02/09/2015 1100   MONOABS 0.6 09/22/2018 1019   MONOABS 0.4 02/09/2015 1100   EOSABS 0.5 09/22/2018 1019   EOSABS 0.2 02/09/2015 1100   BASOSABS 0.1 09/22/2018 1019   BASOSABS 0.1 02/09/2015 1100   CMP     Component Value Date/Time   NA 137 12/24/2018 0428   NA 132  (L)  02/09/2015 1100   K 4.6 12/24/2018 0428   K 3.8 02/09/2015 1100   CL 116 (H) 12/24/2018 0428   CL 95 (L) 02/09/2015 1100   CO2 16 (L) 12/24/2018 0428   CO2 29 02/09/2015 1100   GLUCOSE 127 (H) 12/24/2018 0428   GLUCOSE 105 (H) 02/09/2015 1100   BUN 30 (H) 12/24/2018 0428   BUN 16 02/09/2015 1100   CREATININE 1.53 (H) 12/24/2018 0428   CREATININE 0.81 02/09/2015 1100   CALCIUM 9.0 12/24/2018 0428   CALCIUM 9.1 02/09/2015 1100   PROT 8.1 09/22/2018 1019   PROT 7.7 02/09/2015 1100   ALBUMIN 4.2 09/22/2018 1019   ALBUMIN 4.3 02/09/2015 1100   AST 20 09/22/2018 1019   AST 29 02/09/2015 1100   ALT 18 09/22/2018 1019   ALT 20 02/09/2015 1100   ALKPHOS 90 09/22/2018 1019   ALKPHOS 69 02/09/2015 1100   BILITOT 0.6 09/22/2018 1019   BILITOT 0.9 02/09/2015 1100   GFRNONAA 36 (L) 12/24/2018 0428   GFRNONAA >60 02/09/2015 1100   GFRAA 41 (L) 12/24/2018 0428   GFRAA >60 02/09/2015 1100    Imaging Studies: No results found.  Assessment and Plan:   Danielle [name] is a 63 y.o. y/o female has been referred for dysphagia. Ongoing for a few months, says food feels like it sticks when going down and has heartburn. She has previously had an EGD justa  Few mionths back in 07/2018 and was found to have esophagitis. She is on a BID dose of PPI and still has symptoms. Will add sucralfate, discussed to avoid meals for 2 hours before bed time, keep head end of the bed elevated at night and will plan for EGD to evaluate for a stricture/dilation once elective cases are resumed.   She will need eloquis holding instructions.   I have discussed alternative options, risks & benefits,  which include, but are not limited to, bleeding, infection, perforation,respiratory complication & drug reaction.  The patient agrees with this plan & written consent will be obtained.     F/u in 8 weeks   I discussed the assessment and treatment plan with the patient. The patient was provided an opportunity to ask  questions and all were answered. The patient agreed with the plan and demonstrated an understanding of the instructions.   The patient was advised to call back or seek an in-person evaluation if the symptoms worsen or if the condition fails to improve as anticipated.     Dr Danielle Bellows MD,MRCP [facility]) Gastroenterology/Hepatology Pager: [phone]   Speech recognition software was used to dictate the above note.

## 2019-01-30 NOTE — Telephone Encounter (Signed)
Called pt to pre-chart for today's e-visit with Dr. Anna  Unable to contact. LVM to return call 

## 2019-02-04 ENCOUNTER — Ambulatory Visit: Payer: Medicaid Other | Admitting: Gastroenterology

## 2019-02-13 ENCOUNTER — Telehealth: Payer: Self-pay

## 2019-02-13 NOTE — Telephone Encounter (Signed)
TELEPHONE CALL NOTE  Danielle [name] has been deemed a candidate for a follow-up tele-health visit to limit community exposure during the Covid-19 pandemic. I spoke with the patient via phone to ensure availability of phone/video source, confirm preferred email & phone number, discuss instructions and expectations, and review consent.   I reminded Danielle [name] to be prepared with any vital sign and/or heart rhythm information that could potentially be obtained via home monitoring, at the time of her visit.  Finally, I reminded Danielle [name] to expect an e-mail containing a link for their video-based visit approximately 15 minutes before her visit, or alternatively, a phone call at the time of her visit if her visit is planned to be a phone encounter.  Did the patient verbally consent to treatment as below? YES  Danielle [name], CMA 02/13/2019 10:26 AM  CONSENT FOR TELE-HEALTH VISIT - PLEASE REVIEW  I hereby voluntarily request, consent and authorize [facility] and its employed or contracted physicians, physician assistants, nurse practitioners or other licensed health care professionals (the Practitioner), to provide me with telemedicine health care services (the "Services") as deemed necessary by the treating Practitioner. I acknowledge and consent to receive the Services by the Practitioner via telemedicine. I understand that the telemedicine visit will involve communicating with the Practitioner through telephonic communication technology and the disclosure of certain medical information by electronic transmission. I acknowledge that I have been given the opportunity to request an in-person assessment or other available alternative prior to the telemedicine visit and am voluntarily participating in the telemedicine visit.  I understand that I have the right to withhold or withdraw my consent to the use of telemedicine in the course of my care at any time, without affecting my  right to future care or treatment, and that the Practitioner or I may terminate the telemedicine visit at any time. I understand that I have the right to inspect all information obtained and/or recorded in the course of the telemedicine visit and may receive copies of available information for a reasonable fee.  I understand that some of the potential risks of receiving the Services via telemedicine include:  [name] Delay or interruption in medical evaluation due to technological equipment failure or disruption; . Information transmitted may not be sufficient (e.g. poor resolution of images) to allow for appropriate medical decision making by the Practitioner; and/or  . In rare instances, security protocols could fail, causing a breach of personal health information.  Furthermore, I acknowledge that it is my responsibility to provide information about my medical history, conditions and care that is complete and accurate to the best of my ability. I acknowledge that Practitioner's advice, recommendations, and/or decision may be based on factors not within their control, such as incomplete or inaccurate data provided by me or lack of visual representation. I understand that the practice of medicine is not an exact science and that Practitioner makes no warranties or guarantees regarding treatment outcomes. I acknowledge that I will receive a copy of this consent concurrently upon execution via email to the email address I last provided but may also request a printed copy by calling the office of [facility].    I understand that my insurance may be billed for this visit.   I have read or had this consent read to me. . I understand the contents of this consent, which adequately explains the benefits and risks of the Services being provided via telemedicine.  [name]  I have been provided ample opportunity to ask questions regarding this consent and the Services and have had my questions answered to my  satisfaction. . I give my informed consent for the services to be provided through the use of telemedicine in my medical care  By participating in this telemedicine visit I agree to the above.

## 2019-02-13 NOTE — Telephone Encounter (Signed)
   TELEPHONE CALL NOTE  This patient has been deemed a candidate for follow-up tele-health visit to limit community exposure during the Covid-19 pandemic. I spoke with the patient via phone to discuss instructions. The patient was advised to review the section on consent for treatment as well. The patient will receive a phone call 2-3 days prior to their E-Visit at which time consent will be verbally confirmed. A Virtual Office Visit appointment type has been scheduled for 02/16/2019 with Darylene Price FNP.  [name] L, [place] 02/13/2019 10:25 AM

## 2019-02-16 ENCOUNTER — Encounter: Payer: Self-pay | Admitting: Family

## 2019-02-16 ENCOUNTER — Ambulatory Visit: Payer: Medicaid Other | Attending: Family | Admitting: Family

## 2019-02-16 ENCOUNTER — Other Ambulatory Visit: Payer: Self-pay

## 2019-02-16 VITALS — Wt 130.0 lb

## 2019-02-16 DIAGNOSIS — I10 Essential (primary) hypertension: Secondary | ICD-10-CM

## 2019-02-16 DIAGNOSIS — I5022 Chronic systolic (congestive) heart failure: Secondary | ICD-10-CM

## 2019-02-16 NOTE — Progress Notes (Signed)
Virtual Visit via Telephone Note    Evaluation Performed:  Follow-up visit  This visit type was conducted due to national recommendations for restrictions regarding the COVID-19 Pandemic (e.g. social distancing).  This format is felt to be most appropriate for this patient at this time.  All issues noted in this document were discussed and addressed.  No physical exam was performed (except for noted visual exam findings with Video Visits).  Please refer to the patient's chart (MyChart message for video visits and phone note for telephone visits) for the patient's consent to telehealth for [facility]  Date:  02/16/2019   ID:  Danielle [name], DOB 1955/10/28, MRN [id number]  Patient Location:  [place] [address]   Provider location:   [facility] [address], [place] [address]  PCP:  Elisabeth Cara, NP  Cardiologist: none Electrophysiologist:  None   Chief Complaint:  Shortness of breath  History of Present Illness:    Danielle [name] is a 63 y.o. female who presents via audio/video conferencing for a telehealth visit today.  Patient verified DOB and address.  The patient does not have symptoms concerning for COVID-19 infection (fever, chills, cough, or new SHORTNESS OF BREATH).   Patient reports a minimal amount of shortness of breath upon moderate exertion. She describes this as being present for several years. She has associated dizziness, dry cough, fatigue and chronic difficulty sleeping along with this. She denies swelling in her legs/ abdomen, palpitations, chest pain, rhinorrhea or weight gain. Does wear oxygen at 2L as needed.   Prior CV studies:   The following studies were reviewed today:  Echo report from 04/21/18 reviewed and showed an EF of 35-40% along with moderate MR and minimal aortic stenosis.   Past Medical History:  Diagnosis Date  . Anxiety   . Arthritis   . Breast cancer ([place]) 2000  .  Cancer ([place]) left    breast cancer 2000, chemo tx's with total mastectomy and lymph nodes resected.   . Cancer of right lung ([place]) 02/21/2016   rad tx's.   . CHF (congestive heart failure) ([place])   . COPD (chronic obstructive pulmonary disease) ([place])   . Dependence on supplemental oxygen   . Depression   . Diabetes mellitus without complication ([place])   . Heart murmur   . Hypertension   . Lung nodule   . Lymphedema   . Personal history of chemotherapy   . Personal history of radiation therapy   . Shortness of breath dyspnea    with exertion  . Status post chemotherapy 2001   left breast cancer  . Status post radiation therapy 2001   left breast cancer   Past Surgical History:  Procedure Laterality Date  . Breast Biospy Left    [facility]  . BREAST SURGERY    . COLONOSCOPY N/A 04/30/2018   Procedure: COLONOSCOPY;  Surgeon: Virgel Manifold, MD;  Location: [facility] ENDOSCOPY;  Service: Endoscopy;  Laterality: N/A;  . COLONOSCOPY N/A 07/22/2018   Procedure: COLONOSCOPY;  Surgeon: Virgel Manifold, MD;  Location: [facility] ENDOSCOPY;  Service: Endoscopy;  Laterality: N/A;  . DILATION AND CURETTAGE OF UTERUS    . ELECTROMAGNETIC NAVIGATION BROCHOSCOPY Right 04/11/2016   Procedure: ELECTROMAGNETIC NAVIGATION BRONCHOSCOPY;  Surgeon: Vilinda Boehringer, MD;  Location: [facility] ORS;  Service: Cardiopulmonary;  Laterality: Right;  . ESOPHAGOGASTRODUODENOSCOPY N/A 07/22/2018   Procedure: ESOPHAGOGASTRODUODENOSCOPY (EGD);  Surgeon: Virgel Manifold, MD;  [facility]  ENDOSCOPY;  Service: Endoscopy;  Laterality: N/A;  . ESOPHAGOGASTRODUODENOSCOPY (EGD) WITH PROPOFOL N/A 05/07/2018   Procedure: ESOPHAGOGASTRODUODENOSCOPY (EGD) WITH PROPOFOL;  Surgeon: Lucilla Lame, MD;  Location: [facility] ENDOSCOPY;  Service: Endoscopy;  Laterality: N/A;  . history of colonoscopy]    . ILEOSCOPY N/A 07/22/2018   Procedure: ILEOSCOPY THROUGH STOMA;  Surgeon: Virgel Manifold, MD;  Location: [facility] ENDOSCOPY;  Service: Endoscopy;   Laterality: N/A;  . ILEOSTOMY    . ILEOSTOMY N/A 09/08/2018   Procedure: ILEOSTOMY REVISION POSSIBLE CREATION;  Surgeon: Herbert Pun, MD;  Location: [facility] ORS;  Service: General;  Laterality: N/A;  . ILEOSTOMY CLOSURE N/A 08/15/2018   Procedure: DILATION OF ILEOSTOMY STRICTURE;  Surgeon: Herbert Pun, MD;  Location: [facility] ORS;  Service: General;  Laterality: N/A;  . LAPAROTOMY Right 05/04/2018   Procedure: EXPLORATORY LAPAROTOMY right colectomy right and left ostomy;  Surgeon: Herbert Pun, MD;  Location: [facility] ORS;  Service: General;  Laterality: Right;  . LUNG BIOPSY    . MASTECTOMY Left    2000, [facility]  . ROTATOR CUFF REPAIR Right    [facility]     Current Meds  Medication Sig  . albuterol (PROVENTIL HFA;VENTOLIN HFA) 108 (90 Base) MCG/ACT inhaler Inhale 2 puffs into the lungs every 6 (six) hours as needed for wheezing or shortness of breath.  [name] apixaban (ELIQUIS) 5 MG TABS tablet Take 1 tablet (5 mg total) by mouth 2 (two) times daily.  . carvedilol (COREG) 6.25 MG tablet Take 1 tablet (6.25 mg total) by mouth 2 (two) times daily.  . citalopram (CELEXA) 40 MG tablet Take 40 mg by mouth daily.  . ferrous sulfate 325 (65 FE) MG tablet Take 1 tablet (325 mg total) by mouth 2 (two) times daily with a meal. (Patient taking differently: Take 325 mg by mouth daily with breakfast. )  . Fluticasone-Umeclidin-Vilant 100-62.5-25 MCG/INH AEPB Inhale into the lungs.  . furosemide (LASIX) 40 MG tablet Take 1 tablet (40 mg total) by mouth daily.  [name] loperamide (IMODIUM A-D) 2 MG tablet Take 1 tablet (2 mg total) by mouth as needed for diarrhea or loose stools.  . Multiple Vitamin (MULTIVITAMIN WITH MINERALS) TABS tablet Take 1 tablet by mouth daily.  . potassium chloride (K-DUR) 10 MEQ tablet Take 10 mEq by mouth every other day.   . sucralfate (CARAFATE) 1 GM/10ML suspension Take 10 mLs (1 g total) by mouth 4 (four) times daily.     Allergies:   Patient has no known allergies.    Social History   Tobacco Use  . Smoking status: Former Smoker    Packs/day: 0.50    Years: 20.00    Pack years: 10.00    Types: Cigarettes    Last attempt to quit: 07/02/2012    Years since quitting: 6.6  . Smokeless tobacco: Current User    Types: Snuff  . Tobacco comment: quit 2014  Substance Use Topics  . Alcohol use: Not Currently    Comment: Occasionally  . Drug use: No     Family Hx: The patient's family history includes Breast cancer (age of onset: 61) in her paternal aunt; Breast cancer (age of onset: 38) in her mother; Cancer in her maternal aunt and mother; Cirrhosis in her father.  ROS:   Please see the history of present illness.     All other systems reviewed and are negative.   Labs/Other Tests and Data Reviewed:    Recent Labs: 09/05/2018: Magnesium 2.1 09/22/2018: ALT 18 12/22/2018: B Natriuretic Peptide 80.0;  TSH 0.601 12/24/2018: BUN 30; Creatinine, Ser 1.53; Hemoglobin 7.5; Platelets 213; Potassium 4.6; Sodium 137   Recent Lipid Panel Lab Results  Component Value Date/Time   TRIG 55 05/12/2018 10:12 AM    Wt Readings from Last 3 Encounters:  02/16/19 130 lb (59 kg)  12/22/18 130 lb (59 kg)  11/27/18 128 lb 4.9 oz (58.2 kg)     Exam:    Vital Signs:  Wt 130 lb (59 kg) Comment: self-reported  BMI 20.36 kg/m    Well nourished, well developed female in no  acute distress.   ASSESSMENT & PLAN:    1.Chronic heart failure with reduced ejection fraction- - NYHA class II - euvolemic today per patient's description of symptoms - weighing daily and says that her weight ranges from 130-134 pounds; reminded to call for an overnight weight gain of >2 pounds or a weekly weight gain of >5 pounds - wearing oxygen at 2L as needed - saw cardiology [name]) 10/23/17 - saw pulmonology Raul Del) 01/07/2019 - consider entresto use if renal function stabilizes - BNP 12/22/2018 was 80.0  2: HTN- - not checking BP at home - sees PCP (@ [facility]) and had  telemedicine visit with them last week - BMP 12/24/2018 reviewed and showed sodium 137, potassium 4.6, creatinine 1.53 and GFR 41    COVID-19 Education: The signs and symptoms of COVID-19 were discussed with the patient and how to seek care for testing (follow up with PCP or arrange E-visit).  The importance of social distancing was discussed today.  Patient Risk:   After full review of this patients clinical status, I feel that they are at least moderate risk at this time.  Time:   Today, I have spent 11 minutes with the patient with telehealth technology discussing medications, diet and symptoms to call about.     Medication Adjustments/Labs and Tests Ordered: Current medicines are reviewed at length with the patient today.  Concerns regarding medicines are outlined above.   Tests Ordered: No orders of the defined types were placed in this encounter.  Medication Changes: No orders of the defined types were placed in this encounter.   Disposition:  Return in 2 months or sooner for any questions/problems before then.   Signed, [name], FNP  02/16/2019 10:06 AM    [facility]

## 2019-02-16 NOTE — Patient Instructions (Signed)
Continue weighing daily and call for an overnight weight gain of > 2 pounds or a weekly weight gain of >5 pounds. 

## 2019-02-25 ENCOUNTER — Telehealth: Payer: Self-pay | Admitting: Internal Medicine

## 2019-02-25 NOTE — Telephone Encounter (Signed)
Danielle [name]/Danielle [name]-please inform patient /daughter that since her recent CT scan from March showed no obvious evidence of disease from lung cancer perspective; I would recommend holding off appointment next week 5//18.   # I would recommend a follow-up in 3 months-MD/labs-CBC CMP- Dr.B

## 2019-02-25 NOTE — Telephone Encounter (Signed)
Daughter returned my phone call at 1400- plan of care reviewed with patient's daughter. She gave verbal understanding and thanked me for calling her.

## 2019-02-25 NOTE — Telephone Encounter (Signed)
Left a detailed vm for daughter to return my phone call with my direct phone #.

## 2019-03-02 ENCOUNTER — Inpatient Hospital Stay: Payer: Medicaid Other | Admitting: Internal Medicine

## 2019-03-02 ENCOUNTER — Inpatient Hospital Stay: Payer: Medicaid Other

## 2019-04-08 ENCOUNTER — Telehealth: Payer: Self-pay | Admitting: Gastroenterology

## 2019-04-08 ENCOUNTER — Ambulatory Visit: Payer: Medicare Other | Admitting: Gastroenterology

## 2019-04-08 ENCOUNTER — Ambulatory Visit: Payer: Medicaid Other | Admitting: Gastroenterology

## 2019-04-08 NOTE — Telephone Encounter (Signed)
Spoke with pt and informed her that we can proceed with scheduling her endoscopy procedure once we receive the cardiac clearance and blood thinner holding instructions from Dr. [name] at [facility]. Pt states she's scheduled to see Dr. [name] on 04-13-19. I explained that they will just need to fax back our procedure clearance form. Pt understands.

## 2019-04-08 NOTE — Telephone Encounter (Signed)
Patient called & would like to schedule a procedure.

## 2019-04-13 NOTE — Telephone Encounter (Signed)
Patient called & states she went to see Dr Vella Kohler & Dr Drema Dallas  & was cleared for the colonoscopy. Please call to schedule.

## 2019-04-14 ENCOUNTER — Other Ambulatory Visit: Payer: Self-pay

## 2019-04-14 DIAGNOSIS — R1310 Dysphagia, unspecified: Secondary | ICD-10-CM

## 2019-04-14 NOTE — Telephone Encounter (Signed)
Spoke with pt regarding her daughter's questions about pt's upcoming endoscopy procedure. I explained to Danielle [name] that I cannot discuss any of her healthcare information with her daughter because she is not on pt DPR. Pt states she understands. I have clarified the instructions for the EGD with pt. Pt is aware that she'll receive printed instructions via mail.

## 2019-04-14 NOTE — Telephone Encounter (Signed)
[name] called (pt daughter) called to help her mother. Someone called her yesterday & went over things she did not understand.Please call daughter.

## 2019-04-21 ENCOUNTER — Other Ambulatory Visit
Admission: RE | Admit: 2019-04-21 | Discharge: 2019-04-21 | Disposition: A | Discharge status: Home / Self Care | Payer: Medicare Other | Source: Ambulatory Visit | Attending: Gastroenterology | Admitting: Gastroenterology

## 2019-04-21 ENCOUNTER — Other Ambulatory Visit: Payer: Self-pay

## 2019-04-21 DIAGNOSIS — Z1159 Encounter for screening for other viral diseases: Secondary | ICD-10-CM | POA: Diagnosis not present

## 2019-04-21 DIAGNOSIS — Z01812 Encounter for preprocedural laboratory examination: Secondary | ICD-10-CM | POA: Insufficient documentation

## 2019-04-21 LAB — SARS CORONAVIRUS 2 (TAT 6-24 HRS): SARS Coronavirus 2: NEGATIVE

## 2019-04-24 ENCOUNTER — Ambulatory Visit: Payer: Medicare Other | Admitting: Anesthesiology

## 2019-04-24 ENCOUNTER — Ambulatory Visit
Admission: RE | Admit: 2019-04-24 | Discharge: 2019-04-24 | Disposition: A | Discharge status: Home / Self Care | Payer: Medicare Other | Attending: Gastroenterology | Admitting: Gastroenterology

## 2019-04-24 ENCOUNTER — Encounter
Admission: RE | Disposition: A | Discharge status: Home / Self Care | Payer: Self-pay | Source: Home / Self Care | Attending: Gastroenterology

## 2019-04-24 DIAGNOSIS — J449 Chronic obstructive pulmonary disease, unspecified: Secondary | ICD-10-CM | POA: Diagnosis not present

## 2019-04-24 DIAGNOSIS — Z85118 Personal history of other malignant neoplasm of bronchus and lung: Secondary | ICD-10-CM | POA: Diagnosis not present

## 2019-04-24 DIAGNOSIS — Z853 Personal history of malignant neoplasm of breast: Secondary | ICD-10-CM | POA: Diagnosis not present

## 2019-04-24 DIAGNOSIS — I110 Hypertensive heart disease with heart failure: Secondary | ICD-10-CM | POA: Insufficient documentation

## 2019-04-24 DIAGNOSIS — Z79899 Other long term (current) drug therapy: Secondary | ICD-10-CM | POA: Diagnosis not present

## 2019-04-24 DIAGNOSIS — Z87891 Personal history of nicotine dependence: Secondary | ICD-10-CM | POA: Diagnosis not present

## 2019-04-24 DIAGNOSIS — F329 Major depressive disorder, single episode, unspecified: Secondary | ICD-10-CM | POA: Insufficient documentation

## 2019-04-24 DIAGNOSIS — Z9221 Personal history of antineoplastic chemotherapy: Secondary | ICD-10-CM | POA: Diagnosis not present

## 2019-04-24 DIAGNOSIS — Z9981 Dependence on supplemental oxygen: Secondary | ICD-10-CM | POA: Insufficient documentation

## 2019-04-24 DIAGNOSIS — R1310 Dysphagia, unspecified: Secondary | ICD-10-CM | POA: Diagnosis not present

## 2019-04-24 DIAGNOSIS — I509 Heart failure, unspecified: Secondary | ICD-10-CM | POA: Insufficient documentation

## 2019-04-24 DIAGNOSIS — F419 Anxiety disorder, unspecified: Secondary | ICD-10-CM | POA: Insufficient documentation

## 2019-04-24 DIAGNOSIS — C155 Malignant neoplasm of lower third of esophagus: Secondary | ICD-10-CM | POA: Insufficient documentation

## 2019-04-24 DIAGNOSIS — Z923 Personal history of irradiation: Secondary | ICD-10-CM | POA: Diagnosis not present

## 2019-04-24 DIAGNOSIS — Z7901 Long term (current) use of anticoagulants: Secondary | ICD-10-CM | POA: Insufficient documentation

## 2019-04-24 DIAGNOSIS — D630 Anemia in neoplastic disease: Secondary | ICD-10-CM | POA: Insufficient documentation

## 2019-04-24 HISTORY — PX: ESOPHAGOGASTRODUODENOSCOPY (EGD) WITH PROPOFOL: SHX5813

## 2019-04-24 SURGERY — ESOPHAGOGASTRODUODENOSCOPY (EGD) WITH PROPOFOL
Anesthesia: General

## 2019-04-24 MED ORDER — PROPOFOL 500 MG/50ML IV EMUL
INTRAVENOUS | Status: DC | PRN
  Administered 2019-04-24: 140 ug/kg/min via INTRAVENOUS

## 2019-04-24 MED ORDER — SODIUM CHLORIDE 0.9 % IV SOLN
INTRAVENOUS | Status: DC
  Administered 2019-04-24: 11:00:00 via INTRAVENOUS

## 2019-04-24 MED ORDER — LIDOCAINE HCL (CARDIAC) PF 100 MG/5ML IV SOSY
PREFILLED_SYRINGE | INTRAVENOUS | Status: DC | PRN
  Administered 2019-04-24: 60 mg via INTRAVENOUS

## 2019-04-24 MED ORDER — LIDOCAINE HCL (PF) 2 % IJ SOLN
INTRAMUSCULAR | Status: AC
  Filled 2019-04-24: qty 40

## 2019-04-24 MED ORDER — PROPOFOL 10 MG/ML IV BOLUS
INTRAVENOUS | Status: DC | PRN
  Administered 2019-04-24: 60 mg via INTRAVENOUS
  Administered 2019-04-24 (×2): 20 mg via INTRAVENOUS

## 2019-04-24 MED ORDER — GLYCOPYRROLATE 0.2 MG/ML IJ SOLN
INTRAMUSCULAR | Status: AC
  Filled 2019-04-24: qty 1

## 2019-04-24 MED ORDER — FENTANYL CITRATE (PF) 100 MCG/2ML IJ SOLN
INTRAMUSCULAR | Status: AC
  Filled 2019-04-24: qty 2

## 2019-04-24 MED ORDER — FENTANYL CITRATE (PF) 100 MCG/2ML IJ SOLN
INTRAMUSCULAR | Status: DC | PRN
  Administered 2019-04-24: 25 ug via INTRAVENOUS

## 2019-04-24 MED ORDER — PROPOFOL 500 MG/50ML IV EMUL
INTRAVENOUS | Status: AC
  Filled 2019-04-24: qty 50

## 2019-04-24 MED ORDER — GLYCOPYRROLATE 0.2 MG/ML IJ SOLN
INTRAMUSCULAR | Status: DC | PRN
  Administered 2019-04-24: 0.2 mg via INTRAVENOUS

## 2019-04-24 NOTE — H&P (Signed)
Jonathon Bellows, MD [address], [place], [place], [place], [address] [address], [place], [place], [place], [address] Phone: [phone]  Fax: [phone]  Primary Care Physician:  System, Provider Not In   Pre-Procedure History & Physical: HPI:  SHARISSE RANTZ is a 63 y.o. female is here for an endoscopy    Past Medical History:  Diagnosis Date   Anxiety    Arthritis    Breast cancer ([place]) 2000   Cancer ([place]) left    breast cancer 2000, chemo tx's with total mastectomy and lymph nodes resected.    Cancer of right lung ([place]) 02/21/2016   rad tx's.    CHF (congestive heart failure) ([facility])    COPD (chronic obstructive pulmonary disease) ([facility])    Dependence on supplemental oxygen    Depression    Diabetes mellitus without complication ([facility])    Heart murmur    Hypertension    Lung nodule    Lymphedema    Personal history of chemotherapy    Personal history of radiation therapy    Shortness of breath dyspnea    with exertion   Status post chemotherapy 2001   left breast cancer   Status post radiation therapy 2001   left breast cancer    Past Surgical History:  Procedure Laterality Date   Breast Biospy Left    [facility]   BREAST SURGERY     COLONOSCOPY N/A 04/30/2018   Procedure: COLONOSCOPY;  Surgeon: Virgel Manifold, MD;  Location: [facility] ENDOSCOPY;  Service: Endoscopy;  Laterality: N/A;   COLONOSCOPY N/A 07/22/2018   Procedure: COLONOSCOPY;  Surgeon: Virgel Manifold, MD;  Location: [facility] ENDOSCOPY;  Service: Endoscopy;  Laterality: N/A;   DILATION AND CURETTAGE OF UTERUS     ELECTROMAGNETIC NAVIGATION BROCHOSCOPY Right 04/11/2016   Procedure: ELECTROMAGNETIC NAVIGATION BRONCHOSCOPY;  Surgeon: Vilinda Boehringer, MD;  Location: [facility] ORS;  Service: Cardiopulmonary;  Laterality: Right;   ESOPHAGOGASTRODUODENOSCOPY N/A 07/22/2018   Procedure: ESOPHAGOGASTRODUODENOSCOPY (EGD);  Surgeon: Virgel Manifold, MD;  Location: [facility] ENDOSCOPY;   Service: Endoscopy;  Laterality: N/A;   ESOPHAGOGASTRODUODENOSCOPY (EGD) WITH PROPOFOL N/A 05/07/2018   Procedure: ESOPHAGOGASTRODUODENOSCOPY (EGD) WITH PROPOFOL;  Surgeon: Lucilla Lame, MD;  Location[facility] ENDOSCOPY;  Service: Endoscopy;  Laterality: N/A;   history of colonoscopy]     ILEOSCOPY N/A 07/22/2018   Procedure: ILEOSCOPY THROUGH STOMA;  Surgeon: Virgel Manifold, MD;  Location: [facility] ENDOSCOPY;  Service: Endoscopy;  Laterality: N/A;   ILEOSTOMY     ILEOSTOMY N/A 09/08/2018   Procedure: ILEOSTOMY REVISION POSSIBLE CREATION;  Surgeon: Herbert Pun, MD;  Location: [facility] ORS;  Service: General;  Laterality: N/A;   ILEOSTOMY CLOSURE N/A 08/15/2018   Procedure: DILATION OF ILEOSTOMY STRICTURE;  Surgeon: Herbert Pun, MD;  Location: [facility] ORS;  Service: General;  Laterality: N/A;   LAPAROTOMY Right 05/04/2018   Procedure: EXPLORATORY LAPAROTOMY right colectomy right and left ostomy;  Surgeon: Herbert Pun, MD;  Location: [facility] ORS;  Service: General;  Laterality: Right;   LUNG BIOPSY     MASTECTOMY Left    2000, [place] Right    [place]    Prior to Admission medications   Medication Sig Start Date End Date Taking? Authorizing Provider  acetaminophen (TYLENOL) 325 MG tablet Take 2 tablets (650 mg total) by mouth every 6 (six) hours as needed for mild pain (or Fever >/= 101). 12/24/18  Yes Gouru, Illene Silver, MD  albuterol (PROVENTIL HFA;VENTOLIN HFA) 108 (90 Base) MCG/ACT inhaler Inhale 2  puffs into the lungs every 6 (six) hours as needed for wheezing or shortness of breath. 04/24/18  Yes Demetrios Loll, MD  carvedilol (COREG) 6.25 MG tablet Take 1 tablet (6.25 mg total) by mouth 2 (two) times daily. 06/24/18  Yes Hackney, Tina A, FNP  citalopram (CELEXA) 40 MG tablet Take 40 mg by mouth daily.   Yes [provider]  ferrous sulfate 325 (65 FE) MG tablet Take 1 tablet (325 mg total) by mouth 2 (two) times daily with a meal. Patient taking  differently: Take 325 mg by mouth daily with breakfast.  07/02/18  Yes [name], MD  Fluticasone-Umeclidin-Vilant 100-62.5-25 MCG/INH AEPB Inhale into the lungs. 01/07/19  Yes [provider]  furosemide (LASIX) 40 MG tablet Take 1 tablet (40 mg total) by mouth daily. 12/24/18  Yes Gouru, Illene Silver, MD  loperamide (IMODIUM A-D) 2 MG tablet Take 1 tablet (2 mg total) by mouth as needed for diarrhea or loose stools. 10/04/18  Yes Herbert Pun, MD  Multiple Vitamin (MULTIVITAMIN WITH MINERALS) TABS tablet Take 1 tablet by mouth daily. 06/06/18  Yes Fritzi Mandes, MD  pantoprazole (PROTONIX) 40 MG tablet Take 1 tablet (40 mg total) by mouth 2 (two) times daily before a meal. 07/23/18  Yes Sudini, Srikar, MD  potassium chloride (K-DUR) 10 MEQ tablet Take 10 mEq by mouth every other day.    Yes [provider]  sucralfate (CARAFATE) 1 GM/10ML suspension Take 10 mLs (1 g total) by mouth 4 (four) times daily. 01/30/19  Yes Jonathon Bellows, MD  apixaban (ELIQUIS) 5 MG TABS tablet Take 1 tablet (5 mg total) by mouth 2 (two) times daily. 06/23/18   [name], MD    Allergies as of 04/14/2019   (No Known Allergies)    Family History  Problem Relation Age of Onset   Breast cancer Mother 63   Cancer Mother        Breast    Cirrhosis Father    Breast cancer Paternal Aunt 23   Cancer Maternal Aunt        Breast     Social History   Socioeconomic History   Marital status: Divorced    Spouse name: Not on file   Number of children: Not on file   Years of education: Not on file   Highest education level: Not on file  Occupational History   Not on file  Social Needs   Financial resource strain: Hard   Food insecurity    Worry: Often true    Inability: Often true   Transportation needs    Medical: No    Non-medical: No  Tobacco Use   Smoking status: Former Smoker    Packs/day: 0.50    Years: 20.00    Pack years: 10.00    Types: Cigarettes      Quit date: 07/02/2012    Years since quitting: 6.8   Smokeless tobacco: Current User    Types: Snuff   Tobacco comment: quit 2014  Substance and Sexual Activity   Alcohol use: Not Currently    Comment: Occasionally   Drug use: No   Sexual activity: Not Currently  Lifestyle   Physical activity    Days per week: 2 days    Minutes per session: 30 min   Stress: Not at all  Relationships   Social connections    Talks on phone: More than three times a week    Gets together: More than three times a week  Attends religious service: 1 to 4 times per year    Active member of club or organization: No    Attends meetings of clubs or organizations: Never    Relationship status: Divorced   Intimate partner violence    Fear of current or ex partner: No    Emotionally abused: No    Physically abused: No    Forced sexual activity: No  Other Topics Concern   Not on file  Social History Narrative   Not on file    Review of Systems: See HPI, otherwise negative ROS  Physical Exam: BP 128/89    Pulse 78    Temp (!) 96.6 F (35.9 C) (Tympanic)    Resp 14    Ht 5\' 6"  (1.676 m)    Wt 61.7 kg    SpO2 97%    BMI 21.95 kg/m  General:   Alert,  pleasant and cooperative in NAD Head:  Normocephalic and atraumatic. Neck:  Supple; no masses or thyromegaly. Lungs:  Clear throughout to auscultation, normal respiratory effort.    Heart:  +S1, +S2, Regular rate and rhythm, No edema. Abdomen:  Soft, nontender and nondistended. Normal bowel sounds, without guarding, and without rebound.   Neurologic:  Alert and  oriented x4;  grossly normal neurologically.  Impression/Plan: ATHINA FAHEY is here for an endoscopy  to be performed for  evaluation of dysphagia    Risks, benefits, limitations, and alternatives regarding endoscopy and dilation have been reviewed with the patient.  Questions have been answered.  All parties agreeable.   Jonathon Bellows, MD  04/24/2019, 11:01 AM

## 2019-04-24 NOTE — Transfer of Care (Signed)
Immediate Anesthesia Transfer of Care Note  Patient: Danielle [name]  Procedure(s) Performed: ESOPHAGOGASTRODUODENOSCOPY (EGD) WITH PROPOFOL (N/A )  Patient Location: PACU  Anesthesia Type:General  Level of Consciousness: sedated  Airway & Oxygen Therapy: Patient Spontanous Breathing and Patient connected to nasal cannula oxygen  Post-op Assessment: Report given to RN and Post -op Vital signs reviewed and stable  Post vital signs: Reviewed and stable  Last Vitals:  Vitals Value Taken Time  BP 100/79 04/24/19 1124  Temp 36.8 C 04/24/19 1124  Pulse 94 04/24/19 1124  Resp 17 04/24/19 1124  SpO2 97 % 04/24/19 1124    Last Pain:  Vitals:   04/24/19 1122  TempSrc: Tympanic         Complications: No apparent anesthesia complications

## 2019-04-24 NOTE — Op Note (Signed)
[facility] Gastroenterology Patient Name: Danielle [name] Procedure Date: 04/24/2019 11:02 AM MRN: [id number] Account #: [id number] Date of Birth: 01-18-1956 Admit Type: Outpatient Age: 63 Room: [facility] ENDO ROOM 4 Gender: Female Note Status: Finalized Procedure:            Upper GI endoscopy Indications:          Dysphagia Providers:            Jonathon Bellows MD, MD Referring MD:         No Local Md, MD (Referring MD) Medicines:            Monitored Anesthesia Care Complications:        No immediate complications. Procedure:            Pre-Anesthesia Assessment:                       - Prior to the procedure, a History and Physical was                        performed, and patient medications, allergies and                        sensitivities were reviewed. The patient's tolerance of                        previous anesthesia was reviewed.                       - The risks and benefits of the procedure and the                        sedation options and risks were discussed with the                        patient. All questions were answered and informed                        consent was obtained.                       - ASA Grade Assessment: III - A patient with severe                        systemic disease.                       After obtaining informed consent, the endoscope was                        passed under direct vision. Throughout the procedure,                        the patient's blood pressure, pulse, and oxygen                        saturations were monitored continuously. The Endoscope                        was introduced through the mouth, and advanced to the  third part of duodenum. The upper GI endoscopy was                        accomplished with ease. The patient tolerated the                        procedure well. Findings:      The examined duodenum was normal.      The stomach was normal.      The cardia and  gastric fundus were normal on retroflexion.      Localized moderate mucosal changes characterized by congestion,       granularity and altered texture were found in the lower third of the       esophagus. Biopsies were taken with a cold forceps for histology.      The cardia and gastric fundus were normal on retroflexion. Impression:           - Normal examined duodenum.                       - Normal stomach.                       - Congested, granular, texture changed mucosa in the                        esophagus. Biopsied. Recommendation:       - Discharge patient to home (with escort).                       - Resume previous diet.                       - Continue present medications.                       - Await pathology results.                       - Return to my office in 1 week. Procedure Code(s):    --- Professional ---                       [id number], Esophagogastroduodenoscopy, flexible, transoral;                        with biopsy, single or multiple Diagnosis Code(s):    --- Professional ---                       K22.8, Other specified diseases of esophagus                       R13.10, Dysphagia, unspecified CPT copyright 2019 American Medical Association. All rights reserved. The codes documented in this report are preliminary and upon coder review may  be revised to meet current compliance requirements. Jonathon Bellows, MD Jonathon Bellows MD, MD 04/24/2019 11:19:56 AM This report has been signed electronically. Number of Addenda: 0 Note Initiated On: 04/24/2019 11:02 AM Estimated Blood Loss: Estimated blood loss: none.      [facility]

## 2019-04-24 NOTE — Anesthesia Post-op Follow-up Note (Signed)
Anesthesia QCDR form completed.        

## 2019-04-24 NOTE — Anesthesia Preprocedure Evaluation (Signed)
Anesthesia Evaluation  Patient identified by MRN, date of birth, ID band Patient awake    Reviewed: Allergy & Precautions, H&P , NPO status , Patient's Chart, lab work & pertinent test results  Airway Mallampati: III  TM Distance: >3 FB Neck ROM: full    Dental  (+) Missing, Chipped, Poor Dentition   Pulmonary neg pulmonary ROS, shortness of breath, COPD,  oxygen dependent, former smoker,    breath sounds clear to auscultation       Cardiovascular hypertension, +CHF and + DOE  negative cardio ROS  + Valvular Problems/Murmurs  Rhythm:regular Rate:Normal  - Left ventricle: Systolic function was moderately reduced. The   estimated ejection fraction was in the range of 35% to 40%. Mod MR   Neuro/Psych PSYCHIATRIC DISORDERS Anxiety Depression negative neurological ROS  negative psych ROS   GI/Hepatic negative GI ROS, Neg liver ROS, PUD,   Endo/Other  negative endocrine ROSdiabetes  Renal/GU ARFRenal disease     Musculoskeletal  (+) Arthritis ,   Abdominal   Peds  Hematology  (+) Blood dyscrasia, anemia ,   Anesthesia Other Findings Past Medical History: No date: Anxiety No date: Arthritis left : Cancer ([place])     Comment:  breast cancer 2000, chemo tx's with total mastectomy and              lymph nodes resected.  02/21/2016: Cancer of right lung ([place])     Comment:  rad tx's.  No date: CHF (congestive heart failure) (HCC) No date: COPD (chronic obstructive pulmonary disease) (HCC) No date: Dependence on supplemental oxygen No date: Depression No date: Diabetes mellitus without complication (HCC) No date: Heart murmur No date: Hypertension No date: Lung nodule No date: Lymphedema No date: Shortness of breath dyspnea     Comment:  with exertion 2001: Status post chemotherapy     Comment:  left breast cancer 2001: Status post radiation therapy     Comment:  left breast cancer  Past Surgical History: No date:  Breast Biospy; Left     Comment:  [facility] No date: BREAST SURGERY 04/30/2018: COLONOSCOPY; N/A     Comment:  Procedure: COLONOSCOPY;  Surgeon: Virgel Manifold,               MD;  Location: [facility] ENDOSCOPY;  Service: Endoscopy;                Laterality: N/A; No date: DILATION AND CURETTAGE OF UTERUS 04/11/2016: ELECTROMAGNETIC NAVIGATION BROCHOSCOPY; Right     Comment:  Procedure: ELECTROMAGNETIC NAVIGATION BRONCHOSCOPY;                Surgeon: Vilinda Boehringer, MD;  Location: [facility] ORS;                Service: Cardiopulmonary;  Laterality: Right; 05/07/2018: ESOPHAGOGASTRODUODENOSCOPY (EGD) WITH PROPOFOL; N/A     Comment:  Procedure: ESOPHAGOGASTRODUODENOSCOPY (EGD) WITH               PROPOFOL;  Surgeon: Lucilla Lame, MD;  Location: [facility]               ENDOSCOPY;  Service: Endoscopy;  Laterality: N/A; No date: history of colonoscopy] No date: ILEOSTOMY 05/04/2018: LAPAROTOMY; Right     Comment:  Procedure: EXPLORATORY LAPAROTOMY right colectomy right               and left ostomy;  Surgeon: Herbert Pun, MD;                Location: [facility] ORS;  Service: General;  Laterality:               Right; No date: LUNG BIOPSY No date: MASTECTOMY; Left     Comment:  2000, [facility] No date: ROTATOR CUFF REPAIR; Right     Comment:  [facility]  BMI    Body Mass Index:  20.30 kg/m      Reproductive/Obstetrics negative OB ROS                             Anesthesia Physical  Anesthesia Plan  ASA: IV  Anesthesia Plan:    Post-op Pain Management:    Induction: Intravenous  PONV Risk Score and Plan: TIVA  Airway Management Planned: Nasal Cannula  Additional Equipment:   Intra-op Plan:   Post-operative Plan:   Informed Consent: I have reviewed the patients History and Physical, chart, labs and discussed the procedure including the risks, benefits and alternatives for the proposed anesthesia with the patient or authorized representative who has indicated his/her  understanding and acceptance.     Dental Advisory Given  Plan Discussed with: Anesthesiologist, CRNA and Surgeon  Anesthesia Plan Comments:         Anesthesia Quick Evaluation

## 2019-04-27 ENCOUNTER — Other Ambulatory Visit: Payer: Self-pay | Admitting: Pathology

## 2019-04-27 ENCOUNTER — Encounter: Payer: Self-pay | Admitting: Gastroenterology

## 2019-04-27 LAB — SURGICAL PATHOLOGY

## 2019-04-27 NOTE — Progress Notes (Unsigned)
m °

## 2019-04-27 NOTE — Anesthesia Postprocedure Evaluation (Signed)
Anesthesia Post Note  Patient: Danielle [name]  Procedure(s) Performed: ESOPHAGOGASTRODUODENOSCOPY (EGD) WITH PROPOFOL (N/A )  Patient location during evaluation: Endoscopy Level of consciousness: awake and alert and oriented Pain management: pain level controlled Vital Signs Assessment: post-procedure vital signs reviewed and stable Respiratory status: spontaneous breathing Cardiovascular status: blood pressure returned to baseline Anesthetic complications: no     Last Vitals:  Vitals:   04/24/19 1132 04/24/19 1142  BP: 123/75 127/81  Pulse: 86 86  Resp: 18 15  Temp:    SpO2: 98% 98%    Last Pain:  Vitals:   04/25/19 1057  TempSrc:   PainSc: 0-No pain                 Yadriel Kerrigan

## 2019-04-28 ENCOUNTER — Other Ambulatory Visit: Payer: Self-pay

## 2019-04-28 ENCOUNTER — Encounter: Payer: Self-pay | Admitting: Gastroenterology

## 2019-04-28 ENCOUNTER — Ambulatory Visit (INDEPENDENT_AMBULATORY_CARE_PROVIDER_SITE_OTHER): Payer: Medicare Other | Admitting: Gastroenterology

## 2019-04-28 VITALS — BP 147/75 | HR 73 | Temp 98.0°F | Ht 67.0 in | Wt 135.6 lb

## 2019-04-28 DIAGNOSIS — C155 Malignant neoplasm of lower third of esophagus: Secondary | ICD-10-CM

## 2019-04-28 NOTE — Progress Notes (Addendum)
Jonathon Bellows MD, MRCP(U.K) [address]  [place]  [place], [place] [address]  Main: [phone]  Fax: [phone]   Primary Care Physician: System, Provider Not In  Primary Gastroenterologist:  Dr. Jonathon Bellows   To discuss results of recent upper endoscopy.  HPI: Danielle [name] is a 63 y.o. female underwent a recent upper endoscopy by myself for evaluation of dysphagia on 04/24/2019.  The mucosa in the lower third of the esophagus looked abnormal.  There was a long streak probably 3 to 4 cm in length occupying 10 to 20% of the circumference with altered texture which I took some biopsies which resulted with squamous cell carcinoma, moderately differentiated..  Current Outpatient Medications  Medication Sig Dispense Refill  . acetaminophen (TYLENOL) 325 MG tablet Take 2 tablets (650 mg total) by mouth every 6 (six) hours as needed for mild pain (or Fever >/= 101).    [name] albuterol (PROVENTIL HFA;VENTOLIN HFA) 108 (90 Base) MCG/ACT inhaler Inhale 2 puffs into the lungs every 6 (six) hours as needed for wheezing or shortness of breath. 1 Inhaler 2  . apixaban (ELIQUIS) 5 MG TABS tablet Take 1 tablet (5 mg total) by mouth 2 (two) times daily. 60 tablet 0  . carvedilol (COREG) 6.25 MG tablet Take 1 tablet (6.25 mg total) by mouth 2 (two) times daily. 60 tablet 3  . citalopram (CELEXA) 40 MG tablet Take 40 mg by mouth daily.    . ferrous sulfate 325 (65 FE) MG tablet Take 1 tablet (325 mg total) by mouth 2 (two) times daily with a meal. (Patient taking differently: Take 325 mg by mouth daily with breakfast. ) 60 tablet 3  . Fluticasone-Umeclidin-Vilant 100-62.5-25 MCG/INH AEPB Inhale into the lungs.    . furosemide (LASIX) 40 MG tablet Take 1 tablet (40 mg total) by mouth daily. 30 tablet 0  . loperamide (IMODIUM A-D) 2 MG tablet Take 1 tablet (2 mg total) by mouth as needed for diarrhea or loose stools. 30 tablet 0  . Multiple Vitamin (MULTIVITAMIN WITH MINERALS) TABS tablet Take 1  tablet by mouth daily. 30 tablet 1  . pantoprazole (PROTONIX) 40 MG tablet Take 1 tablet (40 mg total) by mouth 2 (two) times daily before a meal. 60 tablet 0  . potassium chloride (K-DUR) 10 MEQ tablet Take 10 mEq by mouth every other day.     . sucralfate (CARAFATE) 1 GM/10ML suspension Take 10 mLs (1 g total) by mouth 4 (four) times daily. 420 mL 1   No current facility-administered medications for this visit.     Allergies as of 04/28/2019  . (No Known Allergies)    ROS:  General: Negative for anorexia, weight loss, fever, chills, fatigue, weakness. ENT: Negative for hoarseness, difficulty swallowing , nasal congestion. CV: Negative for chest pain, angina, palpitations, dyspnea on exertion, peripheral edema.  Respiratory: Negative for dyspnea at rest, dyspnea on exertion, cough, sputum, wheezing.  GI: See history of present illness. GU:  Negative for dysuria, hematuria, urinary incontinence, urinary frequency, nocturnal urination.  Endo: Negative for unusual weight change.    Physical Examination:   There were no vitals taken for this visit.  General: Well-nourished, well-developed in no acute distress.  Eyes: No icterus. Conjunctivae pink. Mouth: Oropharyngeal mucosa moist and pink , no lesions erythema or exudate. Lungs: Clear to auscultation bilaterally. Non-labored. Heart: Regular rate and rhythm, no murmurs rubs or gallops.  Abdomen: Bowel sounds are normal, nontender, nondistended, no hepatosplenomegaly or masses, no abdominal bruits or  hernia , no rebound or guarding.   Extremities: No lower extremity edema. No clubbing or deformities. Neuro: Alert and oriented x 3.  Grossly intact. Skin: Warm and dry, no jaundice.   Psych: Alert and cooperative, normal mood and affect.   Imaging Studies: No results found.  Assessment and Plan:   Danielle [name] is a 63 y.o. y/o female is here today to discuss the results of her recent upper endoscopy which he underwent for  dysphagia.  At the lower end of the esophagus I noted an abnormal appearing area of mucosa biopsies of which suggest she has squamous cell carcinoma.  She has been put on the tumor board for discussion on Thursday  Plan 1.  CT chest abdomen and pelvis for staging.  If limited to the esophagus then would require EUS to determine the staging and if T1NO then  endoscopic mucosal resection is an option. 2.  Urgent referral to the cancer center to Dr. Rogue Bussing  3.  Follow-up as needed   Dr Jonathon Bellows  MD,MRCP [facility]) Follow up in PRN

## 2019-04-28 NOTE — Addendum Note (Signed)
Addended by: Dorethea Clan on: 04/28/2019 12:16 PM   Modules accepted: Orders

## 2019-04-29 ENCOUNTER — Other Ambulatory Visit
Admission: RE | Admit: 2019-04-29 | Discharge: 2019-04-29 | Disposition: A | Discharge status: Home / Self Care | Payer: Medicare Other | Source: Home / Self Care | Attending: Gastroenterology | Admitting: Gastroenterology

## 2019-04-29 ENCOUNTER — Telehealth: Payer: Self-pay | Admitting: Gastroenterology

## 2019-04-29 ENCOUNTER — Ambulatory Visit
Admission: RE | Admit: 2019-04-29 | Discharge: 2019-04-29 | Disposition: A | Discharge status: Home / Self Care | Payer: Medicare Other | Source: Ambulatory Visit | Attending: Gastroenterology | Admitting: Gastroenterology

## 2019-04-29 DIAGNOSIS — C155 Malignant neoplasm of lower third of esophagus: Secondary | ICD-10-CM

## 2019-04-29 LAB — BUN: BUN: 24 mg/dL — ABNORMAL HIGH (ref 8–23)

## 2019-04-29 LAB — CREATININE, SERUM
Creatinine, Ser: 1.37 mg/dL — ABNORMAL HIGH (ref 0.44–1.00)
GFR calc Af Amer: 47 mL/min — ABNORMAL LOW (ref >60)
GFR calc non Af Amer: 40 mL/min — ABNORMAL LOW (ref >60)

## 2019-04-29 MED ORDER — IOHEXOL 300 MG/ML  SOLN
100.0000 mL | Freq: Once | INTRAMUSCULAR | Status: AC | PRN
  Administered 2019-04-29: 09:00:00 100 mL via INTRAVENOUS

## 2019-04-29 NOTE — Telephone Encounter (Signed)
Thank you :)

## 2019-04-29 NOTE — Addendum Note (Signed)
Addended by: Elmo Putt on: 04/29/2019 08:57 AM   Modules accepted: Orders

## 2019-04-29 NOTE — Progress Notes (Deleted)
Patient ID: Danielle [name], female    DOB: Aug 06, 1956, 63 y.o.   MRN: [id number]  HPI  Danielle [name] is a 63 y/o female with a history of breast/ lung cancer, DM, HTN, COPD, anxiety, depression, lymphedema, previous tobacco use and chronic heart failure.   Echo report from 04/21/18 reviewed and showed an EF of 35-40% along with mild AS/ trivial AR and moderate MR.   Admitted 12/22/2018 due to COPD exacerbation. Initially needed bipap and then transitioned to room air. Given nebulizers and prednisone. IV fluids were given due to acute kidney injury. Discharged after 2 days.   She presents today for a follow-up visit with a chief complaint of   Past Medical History:  Diagnosis Date  . Anxiety   . Arthritis   . Breast cancer ([place]) 2000  . Cancer ([place]) left    breast cancer 2000, chemo tx's with total mastectomy and lymph nodes resected.   . Cancer of right lung ([place]) 02/21/2016   rad tx's.   . CHF (congestive heart failure) ([place])   . COPD (chronic obstructive pulmonary disease) ([place])   . Dependence on supplemental oxygen   . Depression   . Diabetes mellitus without complication ([place])   . Heart murmur   . Hypertension   . Lung nodule   . Lymphedema   . Personal history of chemotherapy   . Personal history of radiation therapy   . Shortness of breath dyspnea    with exertion  . Status post chemotherapy 2001   left breast cancer  . Status post radiation therapy 2001   left breast cancer   Past Surgical History:  Procedure Laterality Date  . Breast Biospy Left    [facility]  . BREAST SURGERY    . COLONOSCOPY N/A 04/30/2018   Procedure: COLONOSCOPY;  Surgeon: Virgel Manifold, MD;  Location: [facility] ENDOSCOPY;  Service: Endoscopy;  Laterality: N/A;  . COLONOSCOPY N/A 07/22/2018   Procedure: COLONOSCOPY;  Surgeon: Virgel Manifold, MD;  Location: [facility] ENDOSCOPY;  Service: Endoscopy;  Laterality: N/A;  . DILATION AND CURETTAGE OF UTERUS    . ELECTROMAGNETIC NAVIGATION BROCHOSCOPY Right  04/11/2016   Procedure: ELECTROMAGNETIC NAVIGATION BRONCHOSCOPY;  Surgeon: Vilinda Boehringer, MD;  Location: [facility] ORS;  Service: Cardiopulmonary;  Laterality: Right;  . ESOPHAGOGASTRODUODENOSCOPY N/A 07/22/2018   Procedure: ESOPHAGOGASTRODUODENOSCOPY (EGD);  Surgeon: Virgel Manifold, MD;  Location: [facility] ENDOSCOPY;  Service: Endoscopy;  Laterality: N/A;  . ESOPHAGOGASTRODUODENOSCOPY (EGD) WITH PROPOFOL N/A 05/07/2018   Procedure: ESOPHAGOGASTRODUODENOSCOPY (EGD) WITH PROPOFOL;  Surgeon: Lucilla Lame, MD;  [facility] END[facility]COPY;  Service: Endoscopy;  Laterality: N/A;  . ESOPHAGOGASTRODUODENOSCOPY (EGD) WITH PROPOFOL N/A 04/24/2019   Procedure: ESOPHAGOGASTRODUODENOSCOPY (EGD) WITH PROPOFOL;  Surgeon: Jonathon Bellows, MD;  Location: [facility] ENDOSCOPY;  Service: Gastroenterology;  Laterality: N/A;  . history of colonoscopy]    . ILEOSCOPY N/A 07/22/2018   Procedure: ILEOSCOPY THROUGH STOMA;  Surgeon: Virgel Manifold, MD;  Location: [facility] ENDOSCOPY;  Service: Endoscopy;  Laterality: N/A;  . ILEOSTOMY    . ILEOSTOMY N/A 09/08/2018   Procedure: ILEOSTOMY REVISION POSSIBLE CREATION;  Surgeon: Herbert Pun, MD;  Location: [facility] ORS;  Service: General;  Laterality: N/A;  . ILEOSTOMY CLOSURE N/A 08/15/2018   Procedure: DILATION OF ILEOSTOMY STRICTURE;  Surgeon: Herbert Pun, MD;  Location: [facility] ORS;  Service: General;  Laterality: N/A;  . LAPAROTOMY Right 05/04/2018   Procedure: EXPLORATORY LAPAROTOMY right colectomy right and left ostomy;  Surgeon: Herbert Pun, MD;  Location: [facility] ORS;  Service: General;  Laterality: Right;  .  LUNG BIOPSY    . MASTECTOMY Left    2000, [facility]  . ROTATOR CUFF REPAIR Right    [facility]   Family History  Problem Relation Age of Onset  . Breast cancer Mother 40  . Cancer Mother        Breast   . Cirrhosis Father   . Breast cancer Paternal Aunt 80  . Cancer Maternal Aunt        Breast    Social History   Tobacco Use  . Smoking status: Former Smoker     Packs/day: 0.50    Years: 20.00    Pack years: 10.00    Types: Cigarettes    Quit date: 07/02/2012    Years since quitting: 6.8  . Smokeless tobacco: Current User    Types: Snuff  . Tobacco comment: quit 2014  Substance Use Topics  . Alcohol use: Not Currently    Comment: Occasionally   No Known Allergies    Review of Systems  Constitutional: Positive for fatigue. Negative for appetite change.  HENT: Positive for rhinorrhea. Negative for congestion and sore throat.   Eyes: Negative.   Respiratory: Negative for cough, chest tightness and shortness of breath.   Cardiovascular: Negative for chest pain, palpitations and leg swelling.  Gastrointestinal: Negative for abdominal distention and abdominal pain.  Endocrine: Negative.   Genitourinary: Negative.   Musculoskeletal: Negative for back pain and neck pain.  Skin: Negative.   Allergic/Immunologic: Negative.   Neurological: Positive for headaches. Negative for dizziness and light-headedness.  Hematological: Negative for adenopathy. Does not bruise/bleed easily.  Psychiatric/Behavioral: Positive for sleep disturbance (trouble falling asleep; sleeping on 2 pillows). Negative for dysphoric mood. The patient is not nervous/anxious.      Physical Exam Vitals signs and nursing note reviewed.  Constitutional:      Appearance: She is well-developed.  HENT:     Head: Normocephalic and atraumatic.  Neck:     Musculoskeletal: Normal range of motion and neck supple.     Vascular: No JVD.  Cardiovascular:     Rate and Rhythm: Normal rate and regular rhythm.  Pulmonary:     Effort: Pulmonary effort is normal. No respiratory distress.     Breath sounds: No wheezing or rales.  Abdominal:     General: There is no distension.     Palpations: Abdomen is soft.  Musculoskeletal:     Right lower leg: She exhibits no tenderness. No edema.     Left lower leg: She exhibits no tenderness. No edema.  Skin:    General: Skin is warm and  dry.  Neurological:     Mental Status: She is alert and oriented to person, place, and time.  Psychiatric:        Behavior: Behavior normal.    Assessment & Plan:  1: Chronic heart failure with reduced ejection fraction- - NYHA class II - euvolemic today - weighing daily and she was reminded to call for an overnight weight gain of >2 pounds or a weekly weight gain of >5 pounds - weight 129.8 from last time she was here ~ 6 months ago - wearing oxygen at 2L as needed - saw cardiology [name]) 04/13/2019 - consider entresto use if renal function stabilizes - BNP 12/22/2018 was 80.0    2: HTN- - BP - sees PCP (@ [facility]) - BMP 12/24/2018 reviewed and showed sodium 137, potassium 4.6, creatinine 1.53 and GFR 41   Medication bottles were reviewed.

## 2019-04-29 NOTE — Telephone Encounter (Signed)
Angel from [facility] is calling to let Dr. Vicente Males know pt CT report is ready.

## 2019-04-30 ENCOUNTER — Other Ambulatory Visit: Payer: Self-pay

## 2019-04-30 ENCOUNTER — Encounter: Payer: Self-pay | Admitting: Gastroenterology

## 2019-04-30 ENCOUNTER — Other Ambulatory Visit: Payer: Medicare Other

## 2019-04-30 ENCOUNTER — Ambulatory Visit: Payer: Medicaid Other | Admitting: Family

## 2019-04-30 NOTE — Progress Notes (Signed)
Tumor Board Documentation  Danielle [name] was presented by Dr Rogue Bussing at our Tumor Board on 04/30/2019, which included representatives from medical oncology, radiation oncology, surgical oncology, surgical, radiology, pathology, navigation, internal medicine, pharmacy, research, palliative care, pulmonology.  Danielle [name] currently presents as a current patient, for new positive pathology, for [place], for discussion with history of the following treatments: active survellience, surgical intervention(s).  Additionally, we reviewed previous medical and familial history, history of present illness, and recent lab results along with all available histopathologic and imaging studies. The tumor board considered available treatment options and made the following recommendations: Concurrent chemo-radiation therapy, Additional screening Needs a PET Scan, not a surgical candidate  The following procedures/referrals were also placed: No orders of the defined types were placed in this encounter.   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  Tumor board is a meeting of clinicians from various specialty areas who evaluate and discuss patients for whom a multidisciplinary approach is being considered. Final determinations in the plan of care are those of the provider(s). The responsibility for follow up of recommendations given during tumor board is that of the provider.   Today's extended care, comprehensive team conference, Danielle [name] was not present for the discussion and was not examined.   Multidisciplinary Tumor Board is a multidisciplinary case peer review process.  Decisions discussed in the Multidisciplinary Tumor Board reflect the opinions of the specialists present at the conference without having examined the  patient.  Ultimately, treatment and diagnostic decisions rest with the primary provider(s) and the patient.

## 2019-04-30 NOTE — Progress Notes (Signed)
FYI appears no metastatic disease

## 2019-05-01 ENCOUNTER — Other Ambulatory Visit: Payer: Self-pay

## 2019-05-01 ENCOUNTER — Telehealth: Payer: Self-pay

## 2019-05-01 ENCOUNTER — Inpatient Hospital Stay: Payer: Medicare Other

## 2019-05-01 ENCOUNTER — Inpatient Hospital Stay: Payer: Medicare Other | Attending: Internal Medicine | Admitting: Internal Medicine

## 2019-05-01 VITALS — BP 122/78 | HR 72 | Temp 96.4°F | Resp 20 | Ht 67.0 in | Wt 134.5 lb

## 2019-05-01 DIAGNOSIS — N183 Chronic kidney disease, stage 3 (moderate): Secondary | ICD-10-CM | POA: Diagnosis not present

## 2019-05-01 DIAGNOSIS — R011 Cardiac murmur, unspecified: Secondary | ICD-10-CM | POA: Diagnosis not present

## 2019-05-01 DIAGNOSIS — Z87891 Personal history of nicotine dependence: Secondary | ICD-10-CM | POA: Diagnosis not present

## 2019-05-01 DIAGNOSIS — Z79899 Other long term (current) drug therapy: Secondary | ICD-10-CM

## 2019-05-01 DIAGNOSIS — J449 Chronic obstructive pulmonary disease, unspecified: Secondary | ICD-10-CM

## 2019-05-01 DIAGNOSIS — D649 Anemia, unspecified: Secondary | ICD-10-CM | POA: Diagnosis not present

## 2019-05-01 DIAGNOSIS — C155 Malignant neoplasm of lower third of esophagus: Secondary | ICD-10-CM

## 2019-05-01 DIAGNOSIS — E1122 Type 2 diabetes mellitus with diabetic chronic kidney disease: Secondary | ICD-10-CM

## 2019-05-01 DIAGNOSIS — I509 Heart failure, unspecified: Secondary | ICD-10-CM | POA: Diagnosis not present

## 2019-05-01 DIAGNOSIS — I4891 Unspecified atrial fibrillation: Secondary | ICD-10-CM | POA: Diagnosis not present

## 2019-05-01 DIAGNOSIS — Z9221 Personal history of antineoplastic chemotherapy: Secondary | ICD-10-CM

## 2019-05-01 DIAGNOSIS — Z933 Colostomy status: Secondary | ICD-10-CM | POA: Diagnosis not present

## 2019-05-01 DIAGNOSIS — R5383 Other fatigue: Secondary | ICD-10-CM | POA: Diagnosis not present

## 2019-05-01 DIAGNOSIS — F418 Other specified anxiety disorders: Secondary | ICD-10-CM | POA: Insufficient documentation

## 2019-05-01 DIAGNOSIS — Z85118 Personal history of other malignant neoplasm of bronchus and lung: Secondary | ICD-10-CM | POA: Diagnosis not present

## 2019-05-01 DIAGNOSIS — I130 Hypertensive heart and chronic kidney disease with heart failure and stage 1 through stage 4 chronic kidney disease, or unspecified chronic kidney disease: Secondary | ICD-10-CM

## 2019-05-01 DIAGNOSIS — Z9011 Acquired absence of right breast and nipple: Secondary | ICD-10-CM

## 2019-05-01 DIAGNOSIS — C3411 Malignant neoplasm of upper lobe, right bronchus or lung: Secondary | ICD-10-CM

## 2019-05-01 DIAGNOSIS — R531 Weakness: Secondary | ICD-10-CM | POA: Diagnosis not present

## 2019-05-01 DIAGNOSIS — Z803 Family history of malignant neoplasm of breast: Secondary | ICD-10-CM

## 2019-05-01 DIAGNOSIS — Z853 Personal history of malignant neoplasm of breast: Secondary | ICD-10-CM | POA: Diagnosis not present

## 2019-05-01 DIAGNOSIS — Z7901 Long term (current) use of anticoagulants: Secondary | ICD-10-CM | POA: Diagnosis not present

## 2019-05-01 DIAGNOSIS — F419 Anxiety disorder, unspecified: Secondary | ICD-10-CM

## 2019-05-01 LAB — CBC WITH DIFFERENTIAL/PLATELET
Abs Immature Granulocytes: 0.04 10*3/uL (ref 0.00–0.07)
Basophils Absolute: 0.1 10*3/uL (ref 0.0–0.1)
Basophils Relative: 1 %
Eosinophils Absolute: 0.5 10*3/uL (ref 0.0–0.5)
Eosinophils Relative: 6 %
HCT: 32.3 % — ABNORMAL LOW (ref 36.0–46.0)
Hemoglobin: 10.2 g/dL — ABNORMAL LOW (ref 12.0–15.0)
Immature Granulocytes: 1 %
Lymphocytes Relative: 22 %
Lymphs Abs: 1.9 10*3/uL (ref 0.7–4.0)
MCH: 31.5 pg (ref 26.0–34.0)
MCHC: 31.6 g/dL (ref 30.0–36.0)
MCV: 99.7 fL (ref 80.0–100.0)
Monocytes Absolute: 0.8 10*3/uL (ref 0.1–1.0)
Monocytes Relative: 9 %
Neutro Abs: 5.1 10*3/uL (ref 1.7–7.7)
Neutrophils Relative %: 61 %
Platelets: 283 10*3/uL (ref 150–400)
RBC: 3.24 MIL/uL — ABNORMAL LOW (ref 3.87–5.11)
RDW: 14 % (ref 11.5–15.5)
WBC: 8.3 10*3/uL (ref 4.0–10.5)
nRBC: 0 % (ref 0.0–0.2)

## 2019-05-01 LAB — COMPREHENSIVE METABOLIC PANEL
ALT: 13 U/L (ref 0–44)
AST: 23 U/L (ref 15–41)
Albumin: 4 g/dL (ref 3.5–5.0)
Alkaline Phosphatase: 100 U/L (ref 38–126)
Anion gap: 11 (ref 5–15)
BUN: 26 mg/dL — ABNORMAL HIGH (ref 8–23)
CO2: 18 mmol/L — ABNORMAL LOW (ref 22–32)
Calcium: 9.3 mg/dL (ref 8.9–10.3)
Chloride: 107 mmol/L (ref 98–111)
Creatinine, Ser: 1.68 mg/dL — ABNORMAL HIGH (ref 0.44–1.00)
GFR calc Af Amer: 37 mL/min — ABNORMAL LOW (ref >60)
GFR calc non Af Amer: 32 mL/min — ABNORMAL LOW (ref >60)
Glucose, Bld: 103 mg/dL — ABNORMAL HIGH (ref 70–99)
Potassium: 4 mmol/L (ref 3.5–5.1)
Sodium: 136 mmol/L (ref 135–145)
Total Bilirubin: 0.5 mg/dL (ref 0.3–1.2)
Total Protein: 8.2 g/dL — ABNORMAL HIGH (ref 6.5–8.1)

## 2019-05-01 LAB — IRON AND TIBC
Iron: 101 ug/dL (ref 28–170)
Saturation Ratios: 26 % (ref 10.4–31.8)
TIBC: 389 ug/dL (ref 250–450)
UIBC: 288 ug/dL

## 2019-05-01 LAB — FERRITIN: Ferritin: 25 ng/mL (ref 11–307)

## 2019-05-01 NOTE — Progress Notes (Signed)
Patient's daughter included in today's visit via ipad/facetime communication. She is aware of the plan of care and all the appointments. Patient unable to get into mycharts today. Temporary Passwords reset per patient's/family's preference.  Pt educated for pet scan prep. Her Daughter is aware as well of the plan of care and apts.

## 2019-05-01 NOTE — Progress Notes (Signed)
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 A. LUNG MASS, RIGHT; CT-GUIDED BIOPSY: - ADENOCARCINOMA, ACINAR AND PAPILLARY MORPHOLOGIES. S/p RT [finished Dec 2017] s/p RT [DEC 2017]; AUG 2019- PET-right middle lobe radiation changes no evidence of recurrence.  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 [status post EGD- [name]]; CT-stable right middle lobe mass/radiation changes; no metastatic disease  # 2000-  Left breast cancer [T3N1- stage III] mastecomy s/p RT; s/p chemo; Tamoxifen  # NOV 111 2017- Mol testing [RML- adeno ca]  # 2019-July-aug 2019-ischemic colitis status post right hemicolectomy [Dr.[name]]; acute renal failure;  -----------------------------------------------------------------    DIAGNOSIS: RML Lung ca adeno [stage I] #squamous cell carcinoma esophagus [question stage]   CURRENT/MOST RECENT THERAPY: surveillance       Breast cancer in female [facility])  07/24/1999 Initial Diagnosis   Breast cancer in female ([place]) T3 N1 M0 tumor ER/PR positive   08/24/1999 -  Anti-estrogen oral therapy   Started tamoxifen   08/24/1999 Surgery   Status post modified radical mastectomy of left breast    Chemotherapy      Radiation Therapy     11/24/2003 - 02/20/2009 Anti-estrogen oral therapy    Started Aromasin   Carcinoma of overlapping sites of left breast in female, estrogen receptor positive ([place])  06/22/2016 Initial Diagnosis   Cancer of overlapping sites of left female breast ([place])   Malignant neoplasm of upper lobe of right lung ([place])  Malignant neoplasm of lower third of esophagus ([place])  05/01/2019 Initial Diagnosis   Malignant neoplasm of lower third of esophagus ([place])       INTERVAL HISTORY:  Danielle [name] 63 y.o.  female pleasant patient above history of lung cancer stage I status post SBRT 2017; and also a remote history of breast cancer is here for follow-up; with new diagnosis of squamous cell esophagus cancer.  Patient recently complained of worsening dysphagia over the last few months.  Went on to have endoscopy that showed erythematous/mucosal changes lower third esophagus-biopsy positive for squamous cell carcinoma.  Patient otherwise has chronic shortness of breath chronic cough.  She continues to have loose stools through her colostomy bag.  However overall stable.   Review of Systems  Constitutional: Positive for malaise/fatigue and weight loss. Negative for chills, diaphoresis and fever.  HENT: Negative for nosebleeds and sore throat.   Eyes: Negative for double vision.  Respiratory: Positive for cough and shortness of breath. Negative for hemoptysis, sputum production and wheezing.   Cardiovascular: Negative for chest pain, palpitations, orthopnea and leg swelling.  Gastrointestinal: Negative for abdominal pain, blood in stool, constipation, diarrhea, heartburn, melena, nausea and vomiting.       Dysphagia.  Genitourinary: Negative for dysuria, frequency and urgency.  Musculoskeletal: Negative for back pain and joint pain.  Skin: Negative.  Negative for itching and  rash.  Neurological: Negative for dizziness, tingling, focal weakness, weakness and headaches.  Endo/Heme/Allergies: Does not bruise/bleed easily.  Psychiatric/Behavioral: Negative for  depression. The patient is not nervous/anxious and does not have insomnia.       PAST MEDICAL HISTORY :  Past Medical History:  Diagnosis Date  . Anxiety   . Arthritis   . Breast cancer ([place]) 2000  . Cancer ([place]) left    breast cancer 2000, chemo tx's with total mastectomy and lymph nodes resected.   . Cancer of right lung ([place]) 02/21/2016   rad tx's.   . CHF (congestive heart failure) ([place])   . COPD (chronic obstructive pulmonary disease) ([place])   . Dependence on supplemental oxygen   . Depression   . Diabetes mellitus without complication ([place])   . Heart murmur   . Hypertension   . Lung nodule   . Lymphedema   . Personal history of chemotherapy   . Personal history of radiation therapy   . Shortness of breath dyspnea    with exertion  . Status post chemotherapy 2001   left breast cancer  . Status post radiation therapy 2001   left breast cancer    PAST SURGICAL HISTORY :   Past Surgical History:  Procedure Laterality Date  . Breast Biospy Left    [facility]  . BREAST SURGERY    . COLONOSCOPY N/A 04/30/2018   Procedure: COLONOSCOPY;  Surgeon: Virgel Manifold, MD;  Location: [facility] ENDOSCOPY;  Service: Endoscopy;  Laterality: N/A;  . COLONOSCOPY N/A 07/22/2018   Procedure: COLONOSCOPY;  Surgeon: Virgel Manifold, MD;  Location: [facility] ENDOSCOPY;  Service: Endoscopy;  Laterality: N/A;  . DILATION AND CURETTAGE OF UTERUS    . ELECTROMAGNETIC NAVIGATION BROCHOSCOPY Right 04/11/2016   Procedure: ELECTROMAGNETIC NAVIGATION BRONCHOSCOPY;  Surgeon: Vilinda Boehringer, MD;  Location: [facility] ORS;  Service: Cardiopulmonary;  Laterality: Right;  . ESOPHAGOGASTRODUODENOSCOPY N/A 07/22/2018   Procedure: ESOPHAGOGASTRODUODENOSCOPY (EGD);  Surgeon: Virgel Manifold, MD;  Location[facility] ENDOSCOPY;  Service: Endoscopy;  Laterality: N/A;  . ESOPHAGOGASTRODUODENOSCOPY (EGD) WITH PROPOFOL N/A 05/07/2018   Procedure: ESOPHAGOGASTRODUODENOSCOPY (EGD) WITH PROPOFOL;  Surgeon: Lucilla Lame, MD;   Location: [facility] ENDOSCOPY;  Service: Endoscopy;  Laterality: N/A;  . ESOPHAGOGASTRODUODENOSCOPY (EGD) WITH PROPOFOL N/A 04/24/2019   Procedure: ESOPHAGOGASTRODUODENOSCOPY (EGD) WITH PROPOFOL;  Surgeon: Jonathon Bellows, MD;  Location: [facility] ENDOSCOPY;  Service: Gastroenterology;  Laterality: N/A;  . history of colonoscopy]    . ILEOSCOPY N/A 07/22/2018   Procedure: ILEOSCOPY THROUGH STOMA;  Surgeon: Virgel Manifold, MD;  Location: [facility] ENDOSCOPY;  Service: Endoscopy;  Laterality: N/A;  . ILEOSTOMY    . ILEOSTOMY N/A 09/08/2018   Procedure: ILEOSTOMY REVISION POSSIBLE CREATION;  Surgeon: Herbert Pun, MD;  Location: [facility] ORS;  Service: General;  Laterality: N/A;  . ILEOSTOMY CLOSURE N/A 08/15/2018   Procedure: DILATION OF ILEOSTOMY STRICTURE;  Surgeon: Herbert Pun, MD;  Location: [facility] ORS;  Service: General;  Laterality: N/A;  . LAPAROTOMY Right 05/04/2018   Procedure: EXPLORATORY LAPAROTOMY right colectomy right and left ostomy;  Surgeon: Herbert Pun, MD;  Location: [facility] ORS;  Service: General;  Laterality: Right;  . LUNG BIOPSY    . MASTECTOMY Left    2000, [facility]  . ROTATOR CUFF REPAIR Right    [facility]    FAMILY HISTORY :   Family History  Problem Relation Age of Onset  . Breast cancer Mother 25  . Cancer Mother        Breast   . Cirrhosis Father   . Breast  cancer Paternal Aunt 35  . Cancer Maternal Aunt        Breast     SOCIAL HISTORY:   Social History   Tobacco Use  . Smoking status: Former Smoker    Packs/day: 0.50    Years: 20.00    Pack years: 10.00    Types: Cigarettes    Quit date: 07/02/2012    Years since quitting: 6.8  . Smokeless tobacco: Current User    Types: Snuff  . Tobacco comment: quit 2014  Substance Use Topics  . Alcohol use: Not Currently    Comment: Occasionally  . Drug use: No    ALLERGIES:  has No Known Allergies.  MEDICATIONS:  Current Outpatient Medications  Medication Sig Dispense Refill  . acetaminophen (TYLENOL)  325 MG tablet Take 2 tablets (650 mg total) by mouth every 6 (six) hours as needed for mild pain (or Fever >/= 101).    [name] albuterol (PROVENTIL HFA;VENTOLIN HFA) 108 (90 Base) MCG/ACT inhaler Inhale 2 puffs into the lungs every 6 (six) hours as needed for wheezing or shortness of breath. 1 Inhaler 2  . apixaban (ELIQUIS) 5 MG TABS tablet Take 1 tablet (5 mg total) by mouth 2 (two) times daily. 60 tablet 0  . carvedilol (COREG) 6.25 MG tablet Take 1 tablet (6.25 mg total) by mouth 2 (two) times daily. 60 tablet 3  . citalopram (CELEXA) 40 MG tablet Take 40 mg by mouth daily.    . ferrous sulfate 325 (65 FE) MG tablet Take 1 tablet (325 mg total) by mouth 2 (two) times daily with a meal. (Patient taking differently: Take 325 mg by mouth daily with breakfast. ) 60 tablet 3  . Fluticasone-Umeclidin-Vilant 100-62.5-25 MCG/INH AEPB Inhale into the lungs.    . furosemide (LASIX) 40 MG tablet Take 1 tablet (40 mg total) by mouth daily. 30 tablet 0  . loperamide (IMODIUM A-D) 2 MG tablet Take 1 tablet (2 mg total) by mouth as needed for diarrhea or loose stools. 30 tablet 0  . Multiple Vitamin (MULTIVITAMIN WITH MINERALS) TABS tablet Take 1 tablet by mouth daily. 30 tablet 1  . pantoprazole (PROTONIX) 40 MG tablet Take 1 tablet (40 mg total) by mouth 2 (two) times daily before a meal. 60 tablet 0  . potassium chloride (K-DUR) 10 MEQ tablet Take 10 mEq by mouth every other day.     . sucralfate (CARAFATE) 1 GM/10ML suspension Take 10 mLs (1 g total) by mouth 4 (four) times daily. 420 mL 1   No current facility-administered medications for this visit.     PHYSICAL EXAMINATION: ECOG PERFORMANCE STATUS: 1 - Symptomatic but completely ambulatory  BP 122/78   Pulse 72   Temp (!) 96.4 F (35.8 C) (Tympanic)   Resp 20   Ht _0  (1.702 m)   Wt 134 lb 8 oz (61 kg)   BMI 21.07 kg/m   Filed Weights   05/01/19 0838  Weight: 134 lb 8 oz (61 kg)    Physical Exam  Constitutional: She is oriented to  person, place, and time.  Thin built moderately nourished female patient in a wheelchair.  Accompanied by daughter.  HENT:  Head: Normocephalic and atraumatic.  Mouth/Throat: Oropharynx is clear and moist. No oropharyngeal exudate.  Eyes: Pupils are equal, round, and reactive to light.  Neck: Normal range of motion. Neck supple.  Cardiovascular: Normal rate and regular rhythm.  Pulmonary/Chest: No respiratory distress. She has no wheezes.  Decreased breath sounds bilaterally.  No  wheeze or crackles.  Abdominal: Soft. Bowel sounds are normal. She exhibits no distension and no mass. There is no abdominal tenderness. There is no rebound and no guarding.  Musculoskeletal: Normal range of motion.        General: No tenderness or edema.  Neurological: She is alert and oriented to person, place, and time.  Skin: Skin is warm.  Psychiatric: Affect normal.       LABORATORY DATA:  I have reviewed the data as listed    Component Value Date/Time   NA 136 05/01/2019 0923   NA 132 (L) 02/09/2015 1100   K 4.0 05/01/2019 0923   K 3.8 02/09/2015 1100   CL 107 05/01/2019 0923   CL 95 (L) 02/09/2015 1100   CO2 18 (L) 05/01/2019 0923   CO2 29 02/09/2015 1100   GLUCOSE 103 (H) 05/01/2019 0923   GLUCOSE 105 (H) 02/09/2015 1100   BUN 26 (H) 05/01/2019 0923   BUN 16 02/09/2015 1100   CREATININE 1.68 (H) 05/01/2019 0923   CREATININE 0.81 02/09/2015 1100   CALCIUM 9.3 05/01/2019 0923   CALCIUM 9.1 02/09/2015 1100   PROT 8.2 (H) 05/01/2019 0923   PROT 7.7 02/09/2015 1100   ALBUMIN 4.0 05/01/2019 0923   ALBUMIN 4.3 02/09/2015 1100   AST 23 05/01/2019 0923   AST 29 02/09/2015 1100   ALT 13 05/01/2019 0923   ALT 20 02/09/2015 1100   ALKPHOS 100 05/01/2019 0923   ALKPHOS 69 02/09/2015 1100   BILITOT 0.5 05/01/2019 0923   BILITOT 0.9 02/09/2015 1100   GFRNONAA 32 (L) 05/01/2019 0923   GFRNONAA >60 02/09/2015 1100   GFRAA 37 (L) 05/01/2019 0923   GFRAA >60 02/09/2015 1100    No results  found for: SPEP, UPEP  Lab Results  Component Value Date   WBC 8.3 05/01/2019   NEUTROABS 5.1 05/01/2019   HGB 10.2 (L) 05/01/2019   HCT 32.3 (L) 05/01/2019   MCV 99.7 05/01/2019   PLT 283 05/01/2019      Chemistry      Component Value Date/Time   NA 136 05/01/2019 0923   NA 132 (L) 02/09/2015 1100   K 4.0 05/01/2019 0923   K 3.8 02/09/2015 1100   CL 107 05/01/2019 0923   CL 95 (L) 02/09/2015 1100   CO2 18 (L) 05/01/2019 0923   CO2 29 02/09/2015 1100   BUN 26 (H) 05/01/2019 0923   BUN 16 02/09/2015 1100   CREATININE 1.68 (H) 05/01/2019 0923   CREATININE 0.81 02/09/2015 1100      Component Value Date/Time   CALCIUM 9.3 05/01/2019 0923   CALCIUM 9.1 02/09/2015 1100   ALKPHOS 100 05/01/2019 0923   ALKPHOS 69 02/09/2015 1100   AST 23 05/01/2019 0923   AST 29 02/09/2015 1100   ALT 13 05/01/2019 0923   ALT 20 02/09/2015 1100   BILITOT 0.5 05/01/2019 0923   BILITOT 0.9 02/09/2015 1100       RADIOGRAPHIC STUDIES: I have personally reviewed the radiological images as listed and agreed with the findings in the report. No results found.   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  Likely primary not metastatic [given previous malignancies or adenocarcinomas].   #Recommend further evaluation for staging with endoscopic ultrasound.  Also recommend a PET scan given the right middle lobe mass/although stable likely radiation changes.  # Had a long discussion with the patient and daughter regarding-the treatment would  be based on the stage.  However if there is superficial-mucosal resection could be considered.  If, invasive beyond the mucosa-neoadjuvant chemotherapy radiation followed by esophagus resection would be the standard of care.  However I am concerned that patient might not be a surgical candidate given her multiple medical problems.  However she could still be evaluated  by [place] tertiary center.  # Right upper lobe stage I adenocarcinoma the lung status post SBRT 2017; July 2020 CT scan stable.  Await PET scan  # History of ER/PR positive HER-2/neu negative breast cancer stage III [2000]- clinically no evidence of recurrence.  Stable.  # Anemia- hb 9-10 /stable; NOT iron deficient; monitor for now.  Stable  # A.fib on eliquis; stable as per cardiology.  Will need to stop it prior to EUS.  Defer to cardiology for clearance.  #Chronic renal insufficiency-stage III not on dialysis await labs from today.  #Chronic anemia/multifactorial-check CBC iron studies  #Discussed with the patient's daughter over the face time.  Agrees with the plan of care.  # DISPOSITION: # PET scan ASAP # referral for EUS/[facility]- # labs- today- cbc/cmp/cea/iron studies/ferritin # follow up in 2 weeks- MD; no labs- Dr.B  # 40 minutes face-to-face with the patient discussing the above plan of care; more than 50% of time spent on prognosis/ natural history; counseling and coordination.    Orders Placed This Encounter  Procedures  . NM PET Image Restag (PS) Skull Base To Thigh    Standing Status:   Future    Standing Expiration Date:   04/30/2020    Order Specific Question:   ** REASON FOR EXAM (FREE TEXT)    Answer:   esophagus cancer    Order Specific Question:   If indicated for the ordered procedure, I authorize the administration of a radiopharmaceutical per Radiology protocol    Answer:   Yes    Order Specific Question:   Radiology Contrast Protocol - do NOT remove file path    Answer:   [email or web address]  . CBC with Differential/Platelet    Standing Status:   Future    Number of Occurrences:   1    Standing Expiration Date:   06/04/2020  . Comprehensive metabolic panel    Standing Status:   Future    Number of Occurrences:   1    Standing Expiration Date:   06/04/2020  . Iron and TIBC    Standing Status:   Future    Number of  Occurrences:   1    Standing Expiration Date:   06/04/2020  . Ferritin    Standing Status:   Future    Number of Occurrences:   1    Standing Expiration Date:   06/04/2020  . CEA    Standing Status:   Future    Number of Occurrences:   1    Standing Expiration Date:   06/04/2020   All questions were answered. The patient knows to call the clinic with any problems, questions or concerns.      [name], MD 05/01/2019 12:40 PM

## 2019-05-01 NOTE — Assessment & Plan Note (Addendum)
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  Likely primary not metastatic [given previous malignancies or adenocarcinomas].   #Recommend further evaluation for staging with endoscopic ultrasound.  Also recommend a PET scan given the right middle lobe mass/although stable likely radiation changes.  # Had a long discussion with the patient and daughter regarding-the treatment would be based on the stage.  However if there is superficial-mucosal resection could be considered.  If, invasive beyond the mucosa-neoadjuvant chemotherapy radiation followed by esophagus resection would be the standard of care.  However I am concerned that patient might not be a surgical candidate given her multiple medical problems.  However she could still be evaluated by [place] tertiary center.  # Right upper lobe stage I adenocarcinoma the lung status post SBRT 2017; July 2020 CT scan stable.  Await PET scan  # History of ER/PR positive HER-2/neu negative breast cancer stage III [2000]- clinically no evidence of recurrence.  Stable.  # Anemia- hb 9-10 /stable; NOT iron deficient; monitor for now.  Stable  # A.fib on eliquis; stable as per cardiology.  Will need to stop it prior to EUS.  Defer to cardiology for clearance.  #Chronic renal insufficiency-stage III not on dialysis await labs from today.  #Chronic anemia/multifactorial-check CBC iron studies  #Discussed with the patient's daughter over the face time.  Agrees with the plan of care.  # DISPOSITION: # PET scan ASAP # referral for EUS/[facility]- # labs- today- cbc/cmp/cea/iron studies/ferritin # follow up in 2 weeks- MD; no labs- Dr.B  # 40 minutes face-to-face with the patient discussing the above plan of care; more than 50% of time spent on prognosis/ natural history; counseling and coordination.

## 2019-05-01 NOTE — Telephone Encounter (Signed)
EUS has been scheduled for 05-07-19 with Dr. Francella Solian. See patient my chart message for all instructions reviewed. These included instructions for EUS/covid-19 testing/holding Eliquis.

## 2019-05-01 NOTE — H&P (View-Only) (Signed)
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 A. LUNG MASS, RIGHT; CT-GUIDED BIOPSY: - ADENOCARCINOMA, ACINAR AND PAPILLARY MORPHOLOGIES. S/p RT [finished Dec 2017] s/p RT [DEC 2017]; AUG 2019- PET-right middle lobe radiation changes no evidence of recurrence.  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 [status post EGD- [name]]; CT-stable right middle lobe mass/radiation changes; no metastatic disease  # 2000-  Left breast cancer [T3N1- stage III] mastecomy s/p RT; s/p chemo; Tamoxifen  # NOV 111 2017- Mol testing [RML- adeno ca]  # 2019-July-aug 2019-ischemic colitis status post right hemicolectomy [Dr.[name]]; acute renal failure;  -----------------------------------------------------------------    DIAGNOSIS: RML Lung ca adeno [stage I] #squamous cell carcinoma esophagus [question stage]   CURRENT/MOST RECENT THERAPY: surveillance       Breast cancer in female [facility])  07/24/1999 Initial Diagnosis   Breast cancer in female ([place]) T3 N1 M0 tumor ER/PR positive   08/24/1999 -  Anti-estrogen oral therapy   Started tamoxifen   08/24/1999 Surgery   Status post modified radical mastectomy of left breast    Chemotherapy      Radiation Therapy     11/24/2003 - 02/20/2009 Anti-estrogen oral therapy    Started Aromasin   Carcinoma of overlapping sites of left breast in female, estrogen receptor positive ([place])  06/22/2016 Initial Diagnosis   Cancer of overlapping sites of left female breast ([place])   Malignant neoplasm of upper lobe of right lung ([place])  Malignant neoplasm of lower third of esophagus ([place])  05/01/2019 Initial Diagnosis   Malignant neoplasm of lower third of esophagus ([place])       INTERVAL HISTORY:  Danielle [name] 63 y.o.  female pleasant patient above history of lung cancer stage I status post SBRT 2017; and also a remote history of breast cancer is here for follow-up; with new diagnosis of squamous cell esophagus cancer.  Patient recently complained of worsening dysphagia over the last few months.  Went on to have endoscopy that showed erythematous/mucosal changes lower third esophagus-biopsy positive for squamous cell carcinoma.  Patient otherwise has chronic shortness of breath chronic cough.  She continues to have loose stools through her colostomy bag.  However overall stable.   Review of Systems  Constitutional: Positive for malaise/fatigue and weight loss. Negative for chills, diaphoresis and fever.  HENT: Negative for nosebleeds and sore throat.   Eyes: Negative for double vision.  Respiratory: Positive for cough and shortness of breath. Negative for hemoptysis, sputum production and wheezing.   Cardiovascular: Negative for chest pain, palpitations, orthopnea and leg swelling.  Gastrointestinal: Negative for abdominal pain, blood in stool, constipation, diarrhea, heartburn, melena, nausea and vomiting.       Dysphagia.  Genitourinary: Negative for dysuria, frequency and urgency.  Musculoskeletal: Negative for back pain and joint pain.  Skin: Negative.  Negative for itching and  rash.  Neurological: Negative for dizziness, tingling, focal weakness, weakness and headaches.  Endo/Heme/Allergies: Does not bruise/bleed easily.  Psychiatric/Behavioral: Negative for  depression. The patient is not nervous/anxious and does not have insomnia.       PAST MEDICAL HISTORY :  Past Medical History:  Diagnosis Date  . Anxiety   . Arthritis   . Breast cancer ([place]) 2000  . Cancer ([place]) left    breast cancer 2000, chemo tx's with total mastectomy and lymph nodes resected.   . Cancer of right lung ([place]) 02/21/2016   rad tx's.   . CHF (congestive heart failure) ([place])   . COPD (chronic obstructive pulmonary disease) ([place])   . Dependence on supplemental oxygen   . Depression   . Diabetes mellitus without complication ([place])   . Heart murmur   . Hypertension   . Lung nodule   . Lymphedema   . Personal history of chemotherapy   . Personal history of radiation therapy   . Shortness of breath dyspnea    with exertion  . Status post chemotherapy 2001   left breast cancer  . Status post radiation therapy 2001   left breast cancer    PAST SURGICAL HISTORY :   Past Surgical History:  Procedure Laterality Date  . Breast Biospy Left    [facility]  . BREAST SURGERY    . COLONOSCOPY N/A 04/30/2018   Procedure: COLONOSCOPY;  Surgeon: Virgel Manifold, MD;  Location: [facility] ENDOSCOPY;  Service: Endoscopy;  Laterality: N/A;  . COLONOSCOPY N/A 07/22/2018   Procedure: COLONOSCOPY;  Surgeon: Virgel Manifold, MD;  Location: [facility] ENDOSCOPY;  Service: Endoscopy;  Laterality: N/A;  . DILATION AND CURETTAGE OF UTERUS    . ELECTROMAGNETIC NAVIGATION BROCHOSCOPY Right 04/11/2016   Procedure: ELECTROMAGNETIC NAVIGATION BRONCHOSCOPY;  Surgeon: Vilinda Boehringer, MD;  Location: [facility] ORS;  Service: Cardiopulmonary;  Laterality: Right;  . ESOPHAGOGASTRODUODENOSCOPY N/A 07/22/2018   Procedure: ESOPHAGOGASTRODUODENOSCOPY (EGD);  Surgeon: Virgel Manifold, MD;  Location[facility] ENDOSCOPY;  Service: Endoscopy;  Laterality: N/A;  . ESOPHAGOGASTRODUODENOSCOPY (EGD) WITH PROPOFOL N/A 05/07/2018   Procedure: ESOPHAGOGASTRODUODENOSCOPY (EGD) WITH PROPOFOL;  Surgeon: Lucilla Lame, MD;   Location: [facility] ENDOSCOPY;  Service: Endoscopy;  Laterality: N/A;  . ESOPHAGOGASTRODUODENOSCOPY (EGD) WITH PROPOFOL N/A 04/24/2019   Procedure: ESOPHAGOGASTRODUODENOSCOPY (EGD) WITH PROPOFOL;  Surgeon: Jonathon Bellows, MD;  Location: [facility] ENDOSCOPY;  Service: Gastroenterology;  Laterality: N/A;  . history of colonoscopy]    . ILEOSCOPY N/A 07/22/2018   Procedure: ILEOSCOPY THROUGH STOMA;  Surgeon: Virgel Manifold, MD;  Location: [facility] ENDOSCOPY;  Service: Endoscopy;  Laterality: N/A;  . ILEOSTOMY    . ILEOSTOMY N/A 09/08/2018   Procedure: ILEOSTOMY REVISION POSSIBLE CREATION;  Surgeon: Herbert Pun, MD;  Location: [facility] ORS;  Service: General;  Laterality: N/A;  . ILEOSTOMY CLOSURE N/A 08/15/2018   Procedure: DILATION OF ILEOSTOMY STRICTURE;  Surgeon: Herbert Pun, MD;  Location: [facility] ORS;  Service: General;  Laterality: N/A;  . LAPAROTOMY Right 05/04/2018   Procedure: EXPLORATORY LAPAROTOMY right colectomy right and left ostomy;  Surgeon: Herbert Pun, MD;  Location: [facility] ORS;  Service: General;  Laterality: Right;  . LUNG BIOPSY    . MASTECTOMY Left    2000, [facility]  . ROTATOR CUFF REPAIR Right    [facility]    FAMILY HISTORY :   Family History  Problem Relation Age of Onset  . Breast cancer Mother 25  . Cancer Mother        Breast   . Cirrhosis Father   . Breast  cancer Paternal Aunt 35  . Cancer Maternal Aunt        Breast     SOCIAL HISTORY:   Social History   Tobacco Use  . Smoking status: Former Smoker    Packs/day: 0.50    Years: 20.00    Pack years: 10.00    Types: Cigarettes    Quit date: 07/02/2012    Years since quitting: 6.8  . Smokeless tobacco: Current User    Types: Snuff  . Tobacco comment: quit 2014  Substance Use Topics  . Alcohol use: Not Currently    Comment: Occasionally  . Drug use: No    ALLERGIES:  has No Known Allergies.  MEDICATIONS:  Current Outpatient Medications  Medication Sig Dispense Refill  . acetaminophen (TYLENOL)  325 MG tablet Take 2 tablets (650 mg total) by mouth every 6 (six) hours as needed for mild pain (or Fever >/= 101).    [name] albuterol (PROVENTIL HFA;VENTOLIN HFA) 108 (90 Base) MCG/ACT inhaler Inhale 2 puffs into the lungs every 6 (six) hours as needed for wheezing or shortness of breath. 1 Inhaler 2  . apixaban (ELIQUIS) 5 MG TABS tablet Take 1 tablet (5 mg total) by mouth 2 (two) times daily. 60 tablet 0  . carvedilol (COREG) 6.25 MG tablet Take 1 tablet (6.25 mg total) by mouth 2 (two) times daily. 60 tablet 3  . citalopram (CELEXA) 40 MG tablet Take 40 mg by mouth daily.    . ferrous sulfate 325 (65 FE) MG tablet Take 1 tablet (325 mg total) by mouth 2 (two) times daily with a meal. (Patient taking differently: Take 325 mg by mouth daily with breakfast. ) 60 tablet 3  . Fluticasone-Umeclidin-Vilant 100-62.5-25 MCG/INH AEPB Inhale into the lungs.    . furosemide (LASIX) 40 MG tablet Take 1 tablet (40 mg total) by mouth daily. 30 tablet 0  . loperamide (IMODIUM A-D) 2 MG tablet Take 1 tablet (2 mg total) by mouth as needed for diarrhea or loose stools. 30 tablet 0  . Multiple Vitamin (MULTIVITAMIN WITH MINERALS) TABS tablet Take 1 tablet by mouth daily. 30 tablet 1  . pantoprazole (PROTONIX) 40 MG tablet Take 1 tablet (40 mg total) by mouth 2 (two) times daily before a meal. 60 tablet 0  . potassium chloride (K-DUR) 10 MEQ tablet Take 10 mEq by mouth every other day.     . sucralfate (CARAFATE) 1 GM/10ML suspension Take 10 mLs (1 g total) by mouth 4 (four) times daily. 420 mL 1   No current facility-administered medications for this visit.     PHYSICAL EXAMINATION: ECOG PERFORMANCE STATUS: 1 - Symptomatic but completely ambulatory  BP 122/78   Pulse 72   Temp (!) 96.4 F (35.8 C) (Tympanic)   Resp 20   Ht _0  (1.702 m)   Wt 134 lb 8 oz (61 kg)   BMI 21.07 kg/m   Filed Weights   05/01/19 0838  Weight: 134 lb 8 oz (61 kg)    Physical Exam  Constitutional: She is oriented to  person, place, and time.  Thin built moderately nourished female patient in a wheelchair.  Accompanied by daughter.  HENT:  Head: Normocephalic and atraumatic.  Mouth/Throat: Oropharynx is clear and moist. No oropharyngeal exudate.  Eyes: Pupils are equal, round, and reactive to light.  Neck: Normal range of motion. Neck supple.  Cardiovascular: Normal rate and regular rhythm.  Pulmonary/Chest: No respiratory distress. She has no wheezes.  Decreased breath sounds bilaterally.  No  wheeze or crackles.  Abdominal: Soft. Bowel sounds are normal. She exhibits no distension and no mass. There is no abdominal tenderness. There is no rebound and no guarding.  Musculoskeletal: Normal range of motion.        General: No tenderness or edema.  Neurological: She is alert and oriented to person, place, and time.  Skin: Skin is warm.  Psychiatric: Affect normal.       LABORATORY DATA:  I have reviewed the data as listed    Component Value Date/Time   NA 136 05/01/2019 0923   NA 132 (L) 02/09/2015 1100   K 4.0 05/01/2019 0923   K 3.8 02/09/2015 1100   CL 107 05/01/2019 0923   CL 95 (L) 02/09/2015 1100   CO2 18 (L) 05/01/2019 0923   CO2 29 02/09/2015 1100   GLUCOSE 103 (H) 05/01/2019 0923   GLUCOSE 105 (H) 02/09/2015 1100   BUN 26 (H) 05/01/2019 0923   BUN 16 02/09/2015 1100   CREATININE 1.68 (H) 05/01/2019 0923   CREATININE 0.81 02/09/2015 1100   CALCIUM 9.3 05/01/2019 0923   CALCIUM 9.1 02/09/2015 1100   PROT 8.2 (H) 05/01/2019 0923   PROT 7.7 02/09/2015 1100   ALBUMIN 4.0 05/01/2019 0923   ALBUMIN 4.3 02/09/2015 1100   AST 23 05/01/2019 0923   AST 29 02/09/2015 1100   ALT 13 05/01/2019 0923   ALT 20 02/09/2015 1100   ALKPHOS 100 05/01/2019 0923   ALKPHOS 69 02/09/2015 1100   BILITOT 0.5 05/01/2019 0923   BILITOT 0.9 02/09/2015 1100   GFRNONAA 32 (L) 05/01/2019 0923   GFRNONAA >60 02/09/2015 1100   GFRAA 37 (L) 05/01/2019 0923   GFRAA >60 02/09/2015 1100    No results  found for: SPEP, UPEP  Lab Results  Component Value Date   WBC 8.3 05/01/2019   NEUTROABS 5.1 05/01/2019   HGB 10.2 (L) 05/01/2019   HCT 32.3 (L) 05/01/2019   MCV 99.7 05/01/2019   PLT 283 05/01/2019      Chemistry      Component Value Date/Time   NA 136 05/01/2019 0923   NA 132 (L) 02/09/2015 1100   K 4.0 05/01/2019 0923   K 3.8 02/09/2015 1100   CL 107 05/01/2019 0923   CL 95 (L) 02/09/2015 1100   CO2 18 (L) 05/01/2019 0923   CO2 29 02/09/2015 1100   BUN 26 (H) 05/01/2019 0923   BUN 16 02/09/2015 1100   CREATININE 1.68 (H) 05/01/2019 0923   CREATININE 0.81 02/09/2015 1100      Component Value Date/Time   CALCIUM 9.3 05/01/2019 0923   CALCIUM 9.1 02/09/2015 1100   ALKPHOS 100 05/01/2019 0923   ALKPHOS 69 02/09/2015 1100   AST 23 05/01/2019 0923   AST 29 02/09/2015 1100   ALT 13 05/01/2019 0923   ALT 20 02/09/2015 1100   BILITOT 0.5 05/01/2019 0923   BILITOT 0.9 02/09/2015 1100       RADIOGRAPHIC STUDIES: I have personally reviewed the radiological images as listed and agreed with the findings in the report. No results found.   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  Likely primary not metastatic [given previous malignancies or adenocarcinomas].   #Recommend further evaluation for staging with endoscopic ultrasound.  Also recommend a PET scan given the right middle lobe mass/although stable likely radiation changes.  # Had a long discussion with the patient and daughter regarding-the treatment would  be based on the stage.  However if there is superficial-mucosal resection could be considered.  If, invasive beyond the mucosa-neoadjuvant chemotherapy radiation followed by esophagus resection would be the standard of care.  However I am concerned that patient might not be a surgical candidate given her multiple medical problems.  However she could still be evaluated  by [place] tertiary center.  # Right upper lobe stage I adenocarcinoma the lung status post SBRT 2017; July 2020 CT scan stable.  Await PET scan  # History of ER/PR positive HER-2/neu negative breast cancer stage III [2000]- clinically no evidence of recurrence.  Stable.  # Anemia- hb 9-10 /stable; NOT iron deficient; monitor for now.  Stable  # A.fib on eliquis; stable as per cardiology.  Will need to stop it prior to EUS.  Defer to cardiology for clearance.  #Chronic renal insufficiency-stage III not on dialysis await labs from today.  #Chronic anemia/multifactorial-check CBC iron studies  #Discussed with the patient's daughter over the face time.  Agrees with the plan of care.  # DISPOSITION: # PET scan ASAP # referral for EUS/Kristie- # labs- today- cbc/cmp/cea/iron studies/ferritin # follow up in 2 weeks- MD; no labs- Dr.B  # 40 minutes face-to-face with the patient discussing the above plan of care; more than 50% of time spent on prognosis/ natural history; counseling and coordination.    Orders Placed This Encounter  Procedures  . NM PET Image Restag (PS) Skull Base To Thigh    Standing Status:   Future    Standing Expiration Date:   04/30/2020    Order Specific Question:   ** REASON FOR EXAM (FREE TEXT)    Answer:   esophagus cancer    Order Specific Question:   If indicated for the ordered procedure, I authorize the administration of a radiopharmaceutical per Radiology protocol    Answer:   Yes    Order Specific Question:   Radiology Contrast Protocol - do NOT remove file path    Answer:   [email or web address]  . CBC with Differential/Platelet    Standing Status:   Future    Number of Occurrences:   1    Standing Expiration Date:   06/04/2020  . Comprehensive metabolic panel    Standing Status:   Future    Number of Occurrences:   1    Standing Expiration Date:   06/04/2020  . Iron and TIBC    Standing Status:   Future    Number of  Occurrences:   1    Standing Expiration Date:   06/04/2020  . Ferritin    Standing Status:   Future    Number of Occurrences:   1    Standing Expiration Date:   06/04/2020  . CEA    Standing Status:   Future    Number of Occurrences:   1    Standing Expiration Date:   06/04/2020   All questions were answered. The patient knows to call the clinic with any problems, questions or concerns.      [name], MD 05/01/2019 12:40 PM

## 2019-05-01 NOTE — Assessment & Plan Note (Deleted)
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  #Plan to repeat a scan again in 3 months; discussed with Dr. Langley Gauss will discontinue CT scan as ordered radiation oncology on December 12.  # History of ER/PR positive HER-2/neu negative breast cancer stage III [2000]- clinically no evidence of recurrence.  Stable  # Anemia- hb 9-10 /stable; NOT iron deficient; monitor for now.  Stable  # A.fib on eliquis; stable as per cardiology  #Recent acute renal failure-high output from ostomy bag.  Creatinine today is 1.6.  Baseline around 1.  Recommend taking Lasix 20 mg every other day.  # DISPOSITION: # PET scan ASAP # referral for EUS/Kristie- # labs- today- cbc/cmp/cea/iron studies/ferritin # follow up in 2 weeks- MD; no labs- Dr.B  Cc; Danielle [name]/PCP

## 2019-05-02 LAB — CEA: CEA: 3.6 ng/mL (ref 0.0–4.7)

## 2019-05-04 ENCOUNTER — Other Ambulatory Visit
Admission: RE | Admit: 2019-05-04 | Discharge: 2019-05-04 | Disposition: A | Discharge status: Home / Self Care | Payer: Medicare Other | Source: Ambulatory Visit | Attending: Gastroenterology | Admitting: Gastroenterology

## 2019-05-04 ENCOUNTER — Other Ambulatory Visit: Payer: Self-pay

## 2019-05-04 DIAGNOSIS — Z1159 Encounter for screening for other viral diseases: Secondary | ICD-10-CM | POA: Insufficient documentation

## 2019-05-05 LAB — SARS CORONAVIRUS 2 (TAT 6-24 HRS): SARS Coronavirus 2: NEGATIVE

## 2019-05-06 ENCOUNTER — Encounter: Payer: Self-pay | Admitting: *Deleted

## 2019-05-07 ENCOUNTER — Ambulatory Visit: Payer: Medicare Other | Admitting: Certified Registered Nurse Anesthetist

## 2019-05-07 ENCOUNTER — Ambulatory Visit
Admission: RE | Admit: 2019-05-07 | Discharge: 2019-05-07 | Disposition: A | Discharge status: Home / Self Care | Payer: Medicare Other | Attending: Gastroenterology | Admitting: Gastroenterology

## 2019-05-07 ENCOUNTER — Other Ambulatory Visit: Payer: Medicare Other

## 2019-05-07 ENCOUNTER — Encounter: Payer: Self-pay | Admitting: Anesthesiology

## 2019-05-07 ENCOUNTER — Encounter
Admission: RE | Disposition: A | Discharge status: Home / Self Care | Payer: Self-pay | Source: Home / Self Care | Attending: Gastroenterology

## 2019-05-07 DIAGNOSIS — M1990 Unspecified osteoarthritis, unspecified site: Secondary | ICD-10-CM | POA: Diagnosis not present

## 2019-05-07 DIAGNOSIS — J449 Chronic obstructive pulmonary disease, unspecified: Secondary | ICD-10-CM | POA: Diagnosis not present

## 2019-05-07 DIAGNOSIS — E1122 Type 2 diabetes mellitus with diabetic chronic kidney disease: Secondary | ICD-10-CM | POA: Insufficient documentation

## 2019-05-07 DIAGNOSIS — Z79899 Other long term (current) drug therapy: Secondary | ICD-10-CM | POA: Insufficient documentation

## 2019-05-07 DIAGNOSIS — Z803 Family history of malignant neoplasm of breast: Secondary | ICD-10-CM | POA: Diagnosis not present

## 2019-05-07 DIAGNOSIS — Z9981 Dependence on supplemental oxygen: Secondary | ICD-10-CM | POA: Insufficient documentation

## 2019-05-07 DIAGNOSIS — Z9221 Personal history of antineoplastic chemotherapy: Secondary | ICD-10-CM | POA: Insufficient documentation

## 2019-05-07 DIAGNOSIS — D649 Anemia, unspecified: Secondary | ICD-10-CM | POA: Insufficient documentation

## 2019-05-07 DIAGNOSIS — F329 Major depressive disorder, single episode, unspecified: Secondary | ICD-10-CM | POA: Insufficient documentation

## 2019-05-07 DIAGNOSIS — Z85118 Personal history of other malignant neoplasm of bronchus and lung: Secondary | ICD-10-CM | POA: Insufficient documentation

## 2019-05-07 DIAGNOSIS — I509 Heart failure, unspecified: Secondary | ICD-10-CM | POA: Diagnosis not present

## 2019-05-07 DIAGNOSIS — C155 Malignant neoplasm of lower third of esophagus: Secondary | ICD-10-CM | POA: Diagnosis present

## 2019-05-07 DIAGNOSIS — N183 Chronic kidney disease, stage 3 (moderate): Secondary | ICD-10-CM | POA: Insufficient documentation

## 2019-05-07 DIAGNOSIS — Z853 Personal history of malignant neoplasm of breast: Secondary | ICD-10-CM | POA: Insufficient documentation

## 2019-05-07 DIAGNOSIS — Z923 Personal history of irradiation: Secondary | ICD-10-CM | POA: Diagnosis not present

## 2019-05-07 DIAGNOSIS — Z87891 Personal history of nicotine dependence: Secondary | ICD-10-CM | POA: Insufficient documentation

## 2019-05-07 DIAGNOSIS — Z7901 Long term (current) use of anticoagulants: Secondary | ICD-10-CM | POA: Diagnosis not present

## 2019-05-07 DIAGNOSIS — I130 Hypertensive heart and chronic kidney disease with heart failure and stage 1 through stage 4 chronic kidney disease, or unspecified chronic kidney disease: Secondary | ICD-10-CM | POA: Diagnosis not present

## 2019-05-07 HISTORY — PX: EUS: SHX5427

## 2019-05-07 SURGERY — ULTRASOUND, UPPER GI TRACT, ENDOSCOPIC
Anesthesia: General

## 2019-05-07 MED ORDER — FENTANYL CITRATE (PF) 100 MCG/2ML IJ SOLN: 25.0000 ug | INTRAMUSCULAR | Status: DC | PRN

## 2019-05-07 MED ORDER — SODIUM CHLORIDE 0.9 % IV SOLN
INTRAVENOUS | Status: DC
  Administered 2019-05-07: 1000 mL via INTRAVENOUS

## 2019-05-07 MED ORDER — PROPOFOL 10 MG/ML IV BOLUS
INTRAVENOUS | Status: AC
  Filled 2019-05-07: qty 20

## 2019-05-07 MED ORDER — LIDOCAINE HCL (PF) 2 % IJ SOLN
INTRAMUSCULAR | Status: AC
  Filled 2019-05-07: qty 10

## 2019-05-07 MED ORDER — PHENYLEPHRINE HCL (PRESSORS) 10 MG/ML IV SOLN
INTRAVENOUS | Status: DC | PRN
  Administered 2019-05-07: 50 ug via INTRAVENOUS
  Administered 2019-05-07: 100 ug via INTRAVENOUS
  Administered 2019-05-07 (×2): 50 ug via INTRAVENOUS

## 2019-05-07 MED ORDER — PROPOFOL 10 MG/ML IV BOLUS
INTRAVENOUS | Status: DC | PRN
  Administered 2019-05-07 (×2): 10 mg via INTRAVENOUS
  Administered 2019-05-07 (×2): 20 mg via INTRAVENOUS

## 2019-05-07 MED ORDER — LIDOCAINE HCL (CARDIAC) PF 100 MG/5ML IV SOSY
PREFILLED_SYRINGE | INTRAVENOUS | Status: DC | PRN
  Administered 2019-05-07: 50 mg via INTRAVENOUS

## 2019-05-07 MED ORDER — PROPOFOL 500 MG/50ML IV EMUL
INTRAVENOUS | Status: DC | PRN
  Administered 2019-05-07: 150 ug/kg/min via INTRAVENOUS

## 2019-05-07 MED ORDER — FENTANYL CITRATE (PF) 100 MCG/2ML IJ SOLN
INTRAMUSCULAR | Status: AC
  Filled 2019-05-07: qty 2

## 2019-05-07 MED ORDER — FENTANYL CITRATE (PF) 100 MCG/2ML IJ SOLN
INTRAMUSCULAR | Status: DC | PRN
  Administered 2019-05-07: 12.5 ug via INTRAVENOUS
  Administered 2019-05-07: 25 ug via INTRAVENOUS
  Administered 2019-05-07 (×2): 12.5 ug via INTRAVENOUS

## 2019-05-07 MED ORDER — ONDANSETRON HCL 4 MG/2ML IJ SOLN: 4.0000 mg | Freq: Once | INTRAMUSCULAR | Status: DC | PRN

## 2019-05-07 NOTE — Interval H&P Note (Signed)
History and Physical Interval Note:  05/07/2019 12:54 PM  Danielle [name]  has presented today for surgery, with the diagnosis of squamous cell carcinoma esophagus.  The various methods of treatment have been discussed with the patient and family. After consideration of risks, benefits and other options for treatment, the patient has consented to  Procedure(s): FULL UPPER ENDOSCOPIC ULTRASOUND (EUS) RADIAL (N/A) as a surgical intervention.  The patient's history has been reviewed, patient examined, no change in status, stable for surgery.  I have reviewed the patient's chart and labs.  Questions were answered to the patient's satisfaction.     Zada Girt

## 2019-05-07 NOTE — Progress Notes (Signed)
Tumor Board Documentation  Danielle [name] was presented by Dr Rogue Bussing at our Tumor Board on 05/07/2019, which included representatives from medical oncology, radiation oncology, pathology, radiology, surgical, surgical oncology, navigation, internal medicine, research.  Danielle [name] currently presents as a current patient, for new positive pathology, for Danielle [name], for discussion with history of the following treatments: active survellience, surgical intervention(s).  Additionally, we reviewed previous medical and familial history, history of present illness, and recent lab results along with all available histopathologic and imaging studies. The tumor board considered available treatment options and made the following recommendations: Additional screening Await EUS results, PET Scan  The following procedures/referrals were also placed: No orders of the defined types were placed in this encounter.   Clinical Trial Status: not discussed   Staging used: To be determined  National site-specific guidelines   were discussed with respect to the case.  Tumor board is a meeting of clinicians from various specialty areas who evaluate and discuss patients for whom a multidisciplinary approach is being considered. Final determinations in the plan of care are those of the provider(s). The responsibility for follow up of recommendations given during tumor board is that of the provider.   Today's extended care, comprehensive team conference, Danielle [name] was not present for the discussion and was not examined.   Multidisciplinary Tumor Board is a multidisciplinary case peer review process.  Decisions discussed in the Multidisciplinary Tumor Board reflect the opinions of the specialists present at the conference without having examined the patient.  Ultimately, treatment and diagnostic decisions rest with the primary provider(s) and the patient.

## 2019-05-07 NOTE — Anesthesia Preprocedure Evaluation (Signed)
Anesthesia Evaluation  Patient identified by MRN, date of birth, ID band Patient awake    Reviewed: Allergy & Precautions, H&P , NPO status , Patient's Chart, lab work & pertinent test results  Airway Mallampati: III  TM Distance: >3 FB Neck ROM: full    Dental  (+) Missing, Chipped, Poor Dentition   Pulmonary neg pulmonary ROS, shortness of breath, COPD,  oxygen dependent, former smoker,    breath sounds clear to auscultation       Cardiovascular hypertension, +CHF and + DOE  negative cardio ROS  + Valvular Problems/Murmurs  Rhythm:regular Rate:Normal  - Left ventricle: Systolic function was moderately reduced. The   estimated ejection fraction was in the range of 35% to 40%. Mod MR   Neuro/Psych PSYCHIATRIC DISORDERS Anxiety Depression negative neurological ROS  negative psych ROS   GI/Hepatic negative GI ROS, Neg liver ROS, PUD,   Endo/Other  negative endocrine ROSdiabetes  Renal/GU ARFRenal disease     Musculoskeletal  (+) Arthritis ,   Abdominal   Peds  Hematology  (+) Blood dyscrasia, anemia ,   Anesthesia Other Findings Past Medical History: No date: Anxiety No date: Arthritis left : Cancer ([place])     Comment:  breast cancer 2000, chemo tx's with total mastectomy and              lymph nodes resected.  02/21/2016: Cancer of right lung ([place])     Comment:  rad tx's.  No date: CHF (congestive heart failure) (HCC) No date: COPD (chronic obstructive pulmonary disease) (HCC) No date: Dependence on supplemental oxygen No date: Depression No date: Diabetes mellitus without complication (HCC) No date: Heart murmur No date: Hypertension No date: Lung nodule No date: Lymphedema No date: Shortness of breath dyspnea     Comment:  with exertion 2001: Status post chemotherapy     Comment:  left breast cancer 2001: Status post radiation therapy     Comment:  left breast cancer  Past Surgical History: No date:  Breast Biospy; Left     Comment:  [facility] No date: BREAST SURGERY 04/30/2018: COLONOSCOPY; N/A     Comment:  Procedure: COLONOSCOPY;  Surgeon: Virgel Manifold,               MD;  Location: [facility] ENDOSCOPY;  Service: Endoscopy;                Laterality: N/A; No date: DILATION AND CURETTAGE OF UTERUS 04/11/2016: ELECTROMAGNETIC NAVIGATION BROCHOSCOPY; Right     Comment:  Procedure: ELECTROMAGNETIC NAVIGATION BRONCHOSCOPY;                Surgeon: Vilinda Boehringer, MD;  Location: [facility] ORS;                Service: Cardiopulmonary;  Laterality: Right; 05/07/2018: ESOPHAGOGASTRODUODENOSCOPY (EGD) WITH PROPOFOL; N/A     Comment:  Procedure: ESOPHAGOGASTRODUODENOSCOPY (EGD) WITH               PROPOFOL;  Surgeon: Lucilla Lame, MD;  Location: [facility]               ENDOSCOPY;  Service: Endoscopy;  Laterality: N/A; No date: history of colonoscopy] No date: ILEOSTOMY 05/04/2018: LAPAROTOMY; Right     Comment:  Procedure: EXPLORATORY LAPAROTOMY right colectomy right               and left ostomy;  Surgeon: Herbert Pun, MD;                Location: [facility] ORS;  Service: General;  Laterality:               Right; No date: LUNG BIOPSY No date: MASTECTOMY; Left     Comment:  2000, [facility] No date: ROTATOR CUFF REPAIR; Right     Comment:  [facility]  BMI    Body Mass Index:  20.30 kg/m      Reproductive/Obstetrics negative OB ROS                             Anesthesia Physical  Anesthesia Plan  ASA: IV  Anesthesia Plan:    Post-op Pain Management:    Induction: Intravenous  PONV Risk Score and Plan: TIVA  Airway Management Planned: Nasal Cannula  Additional Equipment:   Intra-op Plan:   Post-operative Plan:   Informed Consent: I have reviewed the patients History and Physical, chart, labs and discussed the procedure including the risks, benefits and alternatives for the proposed anesthesia with the patient or authorized representative who has indicated his/her  understanding and acceptance.     Dental Advisory Given  Plan Discussed with: Anesthesiologist, CRNA and Surgeon  Anesthesia Plan Comments:         Anesthesia Quick Evaluation

## 2019-05-07 NOTE — Op Note (Signed)
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 Procedure:            Pre-Anesthesia Assessment:                       - Please see pre-anesthesia assessment documentation                        already completed in Epic.                       After obtaining informed consent, the endoscope was                        passed under direct vision. Throughout the procedure,                        the patient's blood pressure, pulse, and oxygen                        saturations were monitored continuously. The Endoscope                        was introduced through the mouth, and advanced to the                        stomach for ultrasound examination from the esophagus                        and stomach. The upper EUS was accomplished without                        difficulty. The patient tolerated the procedure well. Findings:      ENDOSCOPIC FINDING: :      Localized moderate mucosal changes characterized by erythema, friability       (with contact bleeding), granularity and altered texture were found in       the lower third of the esophagus extending from 31 cm to 37 cm from the       incisors. The GE junction is at 39 cm from incisors.      The entire examined stomach was endoscopically normal.      The examined duodenum was endoscopically normal.  ENDOSONOGRAPHIC FINDING: :      Localized wall thickening was visualized  endosonographically in the       lower third of the esophagus at 37 cm. This appeared to be primarily due       to thickening of the luminal interface/superficial mucosa (Layer 1),       deep mucosa (Layer 2) and possibly submucosa (Layer 3). The esophageal       wall measured up to 3 mm in total thickness. In the distal esophagus no       definite tumor seen to invade the muscularis propria but cannot rule out       submucosal disease.      One benign-appearing lymph node was visualized in the lower       paraesophageal mediastinum (level 8L) with the ultrasound probe located       33 cm from the incisors. It measured 4.9 mm by 4.3 mm in maximal       cross-sectional diameter. The node was round, hypoechoic and had well       defined margins.      Endosonographic imaging in the visualized portion of the liver showed no       lesion. Impression:           - Erythematous, friable (with contact bleeding),                        granular, texture changed mucosa in the esophagus. The                        endoscopic findings extended from 31 cm to 37 cm.                       - Normal stomach.                       - Normal examined duodenum.                       EUS:                       - No defined focal mass seen on EUS.                       - Wall thickening was seen in the lower third of the                        esophagus at 37cm. The thickening appeared to primarily                        be within the luminal interface/superficial mucosa                        (Layer 1), deep mucosa (Layer 2) and possibly submucosa                        (Layer 3). Unclear if this is tumor. No sign of tumor                        into the muscularis propia but cannot rule out                        submucosal disease. The EUS stage is  T1N0MX - concern                        about possible submucosal involvement.                        - One benign lymph node was visualized in the lower                        paraesophageal mediastinum (level 8L). Tissue has not                        been obtained. However, the endosonographic appearance                        is suggestive of benign inflammatory changes based on                        size but cannot rule out metastatic disease - unable to                        perform FNA as the needle would have to pass through                        the primary lesion.                       - No specimens collected. Recommendation:       - Patient has a contact number available for                        emergencies. The signs and symptoms of potential                        delayed complications were discussed with the patient.                        Return to normal activities tomorrow. Written discharge                        instructions were provided to the patient.                       - Return to referring physician.                       - The findings and recommendations were discussed with                        the patient. Zada Girt,  05/07/2019 2:00:45 PM This report has been signed electronically. Number of Addenda: 0 Note Initiated On: 05/07/2019 12:50 PM Estimated Blood Loss: Estimated blood loss: none.      [facility]

## 2019-05-07 NOTE — Anesthesia Post-op Follow-up Note (Signed)
Anesthesia QCDR form completed.        

## 2019-05-07 NOTE — Transfer of Care (Signed)
Immediate Anesthesia Transfer of Care Note  Patient: Danielle [name]  Procedure(s) Performed: FULL UPPER ENDOSCOPIC ULTRASOUND (EUS) RADIAL (N/A )  Patient Location: PACU  Anesthesia Type:General  Level of Consciousness: awake, alert  and oriented  Airway & Oxygen Therapy: Patient Spontanous Breathing and Patient connected to nasal cannula oxygen  Post-op Assessment: Report given to RN and Post -op Vital signs reviewed and stable  Post vital signs: Reviewed and stable  Last Vitals:  Vitals Value Taken Time  BP 112/58 05/07/19 1344  Temp 36.3 C 05/07/19 1344  Pulse 71 05/07/19 1347  Resp 20 05/07/19 1347  SpO2 100 % 05/07/19 1347  Vitals shown include unvalidated device data.  Last Pain:  Vitals:   05/07/19 1344  TempSrc: Tympanic  PainSc: 0-No pain         Complications: No apparent anesthesia complications

## 2019-05-08 ENCOUNTER — Encounter: Payer: Self-pay | Admitting: Gastroenterology

## 2019-05-11 ENCOUNTER — Other Ambulatory Visit: Payer: Medicare Other

## 2019-05-12 ENCOUNTER — Encounter
Admission: RE | Admit: 2019-05-12 | Discharge: 2019-05-12 | Disposition: A | Discharge status: Home / Self Care | Payer: Medicare Other | Source: Ambulatory Visit | Attending: Internal Medicine | Admitting: Internal Medicine

## 2019-05-12 ENCOUNTER — Other Ambulatory Visit: Payer: Self-pay

## 2019-05-12 DIAGNOSIS — Z79899 Other long term (current) drug therapy: Secondary | ICD-10-CM | POA: Insufficient documentation

## 2019-05-12 DIAGNOSIS — C155 Malignant neoplasm of lower third of esophagus: Secondary | ICD-10-CM | POA: Insufficient documentation

## 2019-05-12 LAB — GLUCOSE, CAPILLARY: Glucose-Capillary: 106 mg/dL — ABNORMAL HIGH (ref 70–99)

## 2019-05-12 IMAGING — PT NUCLEAR MEDICINE PET IMAGE RESTAGING (PS) SKULL BASE TO THIGH
1 series · 1 of 1 positions shown · non-contrast
Comparison: CT 04/29/2019, PET-CT 06/20/2018

CLINICAL DATA: Initial treatment strategy for esophageal carcinoma.
A personal history of lung cancer and breast cancer.

EXAM:
NUCLEAR MEDICINE PET SKULL BASE TO THIGH
TECHNIQUE: 7.2 mCi F-18 FDG was injected intravenously. Full-ring PET imaging
was performed from the skull base to thigh after the radiotracer. CT
data was obtained and used for attenuation correction and anatomic
localization.
Fasting blood glucose: 106 mg/dl

[results mm oncology reading · 1.0mm · 1.19mm/px · 1 of 1 slices shown]
[im 1/1]
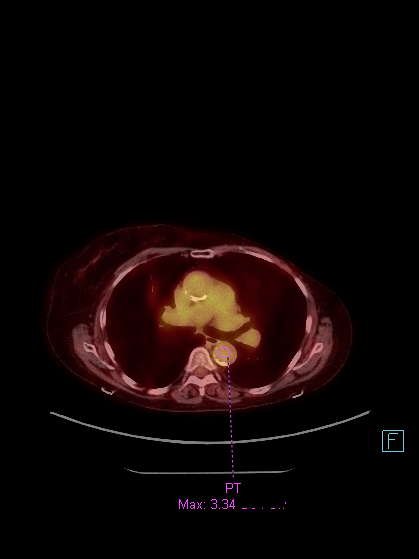

[1 of 1 positions shown; findings below may reference images not displayed]

FINDINGS: Mediastinal blood pool activity: SUV max

Liver activity: SUV max NA

NECK: No hypermetabolic lymph nodes in the neck.

Incidental CT findings: none

CHEST: No focal activity through the mid or distal esophagus. No
hypermetabolic mediastinal nodes. No suspicious pulmonary nodules.
Paraseptal emphysema the upper lobes.

Incidental CT findings: Band of linear consolidation in the RIGHT
upper lobe at prior radiation treatment site without metabolic
activity

ABDOMEN/PELVIS: No abnormal hypermetabolic activity within the
liver, pancreas, adrenal glands, or spleen. No hypermetabolic lymph
nodes in the abdomen or pelvis.

Incidental CT findings: Atherosclerotic calcification of the aorta.
LEFT and RIGHT abdominal ostomies uterus normal. Ovaries normal.

SKELETON: No focal hypermetabolic activity to suggest skeletal
metastasis.

Incidental CT findings: Post LEFT mastectomy anatomy.
IMPRESSION: 1. No focal activity in the esophagus to localize primary esophageal
carcinoma.
2. No evidence mediastinal nodal metastasis or upper abdominal nodal
metastasis.
3. No liver metastasis.
4. Post therapy change in the RIGHT upper lobe without evidence of
local recurrence.
5. Post LEFT mastectomy anatomy.

## 2019-05-12 MED ORDER — FLUDEOXYGLUCOSE F - 18 (FDG) INJECTION
6.9000 | Freq: Once | INTRAVENOUS | Status: AC | PRN
  Administered 2019-05-12: 7.2 via INTRAVENOUS

## 2019-05-14 ENCOUNTER — Other Ambulatory Visit: Payer: Self-pay

## 2019-05-15 ENCOUNTER — Ambulatory Visit: Payer: Medicaid Other | Admitting: Family

## 2019-05-15 ENCOUNTER — Other Ambulatory Visit: Payer: Self-pay

## 2019-05-15 ENCOUNTER — Inpatient Hospital Stay (HOSPITAL_BASED_OUTPATIENT_CLINIC_OR_DEPARTMENT_OTHER): Payer: Medicare Other | Admitting: Internal Medicine

## 2019-05-15 ENCOUNTER — Telehealth: Payer: Self-pay

## 2019-05-15 DIAGNOSIS — E1122 Type 2 diabetes mellitus with diabetic chronic kidney disease: Secondary | ICD-10-CM

## 2019-05-15 DIAGNOSIS — C155 Malignant neoplasm of lower third of esophagus: Secondary | ICD-10-CM | POA: Diagnosis not present

## 2019-05-15 DIAGNOSIS — D649 Anemia, unspecified: Secondary | ICD-10-CM

## 2019-05-15 DIAGNOSIS — I4891 Unspecified atrial fibrillation: Secondary | ICD-10-CM

## 2019-05-15 DIAGNOSIS — J449 Chronic obstructive pulmonary disease, unspecified: Secondary | ICD-10-CM

## 2019-05-15 DIAGNOSIS — Z933 Colostomy status: Secondary | ICD-10-CM

## 2019-05-15 DIAGNOSIS — F418 Other specified anxiety disorders: Secondary | ICD-10-CM

## 2019-05-15 DIAGNOSIS — Z803 Family history of malignant neoplasm of breast: Secondary | ICD-10-CM

## 2019-05-15 DIAGNOSIS — I130 Hypertensive heart and chronic kidney disease with heart failure and stage 1 through stage 4 chronic kidney disease, or unspecified chronic kidney disease: Secondary | ICD-10-CM

## 2019-05-15 DIAGNOSIS — R531 Weakness: Secondary | ICD-10-CM | POA: Diagnosis not present

## 2019-05-15 DIAGNOSIS — Z85118 Personal history of other malignant neoplasm of bronchus and lung: Secondary | ICD-10-CM

## 2019-05-15 DIAGNOSIS — Z9221 Personal history of antineoplastic chemotherapy: Secondary | ICD-10-CM

## 2019-05-15 DIAGNOSIS — Z87891 Personal history of nicotine dependence: Secondary | ICD-10-CM

## 2019-05-15 DIAGNOSIS — N183 Chronic kidney disease, stage 3 (moderate): Secondary | ICD-10-CM

## 2019-05-15 DIAGNOSIS — Z853 Personal history of malignant neoplasm of breast: Secondary | ICD-10-CM

## 2019-05-15 DIAGNOSIS — Z9011 Acquired absence of right breast and nipple: Secondary | ICD-10-CM

## 2019-05-15 DIAGNOSIS — Z79899 Other long term (current) drug therapy: Secondary | ICD-10-CM

## 2019-05-15 DIAGNOSIS — I509 Heart failure, unspecified: Secondary | ICD-10-CM

## 2019-05-15 DIAGNOSIS — R5383 Other fatigue: Secondary | ICD-10-CM

## 2019-05-15 DIAGNOSIS — Z7901 Long term (current) use of anticoagulants: Secondary | ICD-10-CM

## 2019-05-15 DIAGNOSIS — R011 Cardiac murmur, unspecified: Secondary | ICD-10-CM

## 2019-05-15 NOTE — Telephone Encounter (Signed)
Called and spoke with daughter, Danielle [name]. Updated on plan of care as discussed with Ms. Bilger today during her appointment with Dr. Rogue Bussing. Dr. Francella Solian will discuss EUS findings with his colleagues at [facility] to determine if a resection is a possibility. Dr. Francella Solian was concerned that the lesion could have submucosal involvement. If a resection is not possible then chemoradiation would likely be recommended. We will notify them once we hear back from Dr. Francella Solian. PET scan reviewed.  IMPRESSION: 1. No focal activity in the esophagus to localize primary esophageal carcinoma. 2. No evidence mediastinal nodal metastasis or upper abdominal nodal metastasis. 3. No liver metastasis.

## 2019-05-15 NOTE — Progress Notes (Signed)
No new changes noted today 

## 2019-05-15 NOTE — Progress Notes (Signed)
[place] OFFICE PROGRESS NOTE  Patient Care Team: [facility] [place] as PCP - General ([place]) [name], FNP as Nurse Practitioner (Family Medicine) [name], NP as Nurse Practitioner (Nurse Practitioner) [name], RN as Registered Nurse [name], RN as Registered Nurse [name], RN as Oncology Nurse Navigator  Cancer Staging Breast cancer in female [facility]) Staging form: Breast, AJCC 7th Edition - Clinical: Stage IIIA (T3, N1, M0) - Signed by [name], NP on 02/21/2016    Oncology History Overview Note  # 2016- JAN RUL 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 A. LUNG MASS, RIGHT; CT-GUIDED BIOPSY: - ADENOCARCINOMA, ACINAR AND PAPILLARY MORPHOLOGIES. S/p RT [finished Dec 2017] s/p RT [DEC 2017]; AUG 2019- PET-right middle lobe radiation changes no evidence of recurrence.  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  T1b- submucosal involvement; N0- stage I [[name]; [facility]]  # 2000-  Left breast cancer [T3N1- stage III] mastecomy s/p RT; s/p chemo; Tamoxifen  # NOV 111 2017- Mol testing [RML- adeno ca]  # 2019-July-aug 2019-ischemic colitis status post right hemicolectomy [Dr.[name]]; acute renal failure;  -----------------------------------------------------------------    DIAGNOSIS: RML Lung ca adeno [stage I] #squamous cell carcinoma esophagus [question stage]   CURRENT/MOST RECENT THERAPY: surveillance       Breast cancer in female [facility])  07/24/1999 Initial Diagnosis   Breast cancer in female ([place]) T3 N1 M0 tumor ER/PR positive   08/24/1999 -  Anti-estrogen oral therapy    Started tamoxifen   08/24/1999 Surgery   Status post modified radical mastectomy of left breast    Chemotherapy      Radiation Therapy     11/24/2003 - 02/20/2009 Anti-estrogen oral therapy   Started Aromasin   Carcinoma of overlapping sites of left breast in female, estrogen receptor positive ([place])  06/22/2016 Initial Diagnosis   Cancer of overlapping sites of left female breast ([place])   Malignant neoplasm of upper lobe of right lung ([place])  Malignant neoplasm of lower third of esophagus ([place])  05/01/2019 Initial Diagnosis   Malignant neoplasm of lower third of esophagus ([place])       INTERVAL HISTORY:  Danielle [name] 63 y.o.  female pleasant patient above history of lung cancer stage I status post SBRT 2017; and also a remote history of breast cancer is here for follow-up; with new diagnosis of squamous cell esophagus cancer.  In the interim patient underwent endoscopic ultrasound- Localized moderate mucosal changes characterized by erythema/no mass; thickening did not show any evidence of invasion of the muscularis propria.  EUS staging-T1N0  Patient is here to discuss her options.  Patient continues to have chronic shortness of breath chronic cough.  Chronic mild difficulty swallowing.   Review of Systems  Constitutional: Positive for malaise/fatigue and weight loss. Negative for chills, diaphoresis and fever.  HENT: Negative for nosebleeds and sore throat.   Eyes: Negative for double vision.  Respiratory: Positive for cough and shortness of breath. Negative for hemoptysis, sputum production and wheezing.   Cardiovascular: Negative for chest pain, palpitations, orthopnea and leg swelling.  Gastrointestinal: Negative for abdominal pain, blood in stool, constipation, diarrhea, heartburn, melena, nausea and vomiting.  Dysphagia.  Genitourinary: Negative for dysuria, frequency and urgency.  Musculoskeletal: Negative for back pain and joint pain.  Skin: Negative.  Negative  for itching and rash.  Neurological: Negative for dizziness, tingling, focal weakness, weakness and headaches.  Endo/Heme/Allergies: Does not bruise/bleed easily.  Psychiatric/Behavioral: Negative for depression. The patient is not nervous/anxious and does not have insomnia.       PAST MEDICAL HISTORY :  Past Medical History:  Diagnosis Date  . Anxiety   . Arthritis   . Breast cancer ([place]) 2000  . Cancer ([place]) left    breast cancer 2000, chemo tx's with total mastectomy and lymph nodes resected.   . Cancer of right lung ([place]) 02/21/2016   rad tx's.   . CHF (congestive heart failure) ([place])   . COPD (chronic obstructive pulmonary disease) ([place])   . Dependence on supplemental oxygen   . Depression   . Diabetes mellitus without complication ([place])   . Heart murmur   . Hypertension   . Lung nodule   . Lymphedema   . Personal history of chemotherapy   . Personal history of radiation therapy   . Shortness of breath dyspnea    with exertion  . Status post chemotherapy 2001   left breast cancer  . Status post radiation therapy 2001   left breast cancer    PAST SURGICAL HISTORY :   Past Surgical History:  Procedure Laterality Date  . Breast Biospy Left    [facility]  . BREAST SURGERY    . COLONOSCOPY N/A 04/30/2018   Procedure: COLONOSCOPY;  Surgeon: Virgel Manifold, MD;  Location: [facility] ENDOSCOPY;  Service: Endoscopy;  Laterality: N/A;  . COLONOSCOPY N/A 07/22/2018   Procedure: COLONOSCOPY;  Surgeon: Virgel Manifold, MD;  Location: [facility] ENDOSCOPY;  Service: Endoscopy;  Laterality: N/A;  . DILATION AND CURETTAGE OF UTERUS    . ELECTROMAGNETIC NAVIGATION BROCHOSCOPY Right 04/11/2016   Procedure: ELECTROMAGNETIC NAVIGATION BRONCHOSCOPY;  Surgeon: Vilinda Boehringer, MD;  Location: [facility] ORS;  Service: Cardiopulmonary;  Laterality: Right;  . ESOPHAGOGASTRODUODENOSCOPY N/A 07/22/2018   Procedure: ESOPHAGOGASTRODUODENOSCOPY (EGD);  Surgeon: Virgel Manifold, MD;  Location: [facility]  ENDOSCOPY;  Service: Endoscopy;  Laterality: N/A;  . ESOPHAGOGASTRODUODENOSCOPY (EGD) WITH PROPOFOL N/A 05/07/2018   Procedure: ESOPHAGOGASTRODUODENOSCOPY (EGD) WITH PROPOFOL;  Surgeon: Lucilla Lame, MD;  Location[facility] ENDOSCOPY;  Service: Endoscopy;  Laterality: N/A;  . ESOPHAGOGASTRODUODENOSCOPY (EGD) WITH PROPOFOL N/A 04/24/2019   Procedure: ESOPHAGOGASTRODUODENOSCOPY (EGD) WITH PROPOFOL;  Surgeon: Jonathon Bellows, MD;  Location: [facility] ENDOSCOPY;  Service: Gastroenterology;  Laterality: N/A;  . EUS N/A 05/07/2019   Procedure: FULL UPPER ENDOSCOPIC ULTRASOUND (EUS) RADIAL;  Surgeon: Jola Schmidt, MD;  Location: [facility] ENDOSCOPY;  Service: Endoscopy;  Laterality: N/A;  . history of colonoscopy]    . ILEOSCOPY N/A 07/22/2018   Procedure: ILEOSCOPY THROUGH STOMA;  Surgeon: Virgel Manifold, MD;  Location: [facility] ENDOSCOPY;  Service: Endoscopy;  Laterality: N/A;  . ILEOSTOMY    . ILEOSTOMY N/A 09/08/2018   Procedure: ILEOSTOMY REVISION POSSIBLE CREATION;  Surgeon: Herbert Pun, MD;  Location: [facility] ORS;  Service: General;  Laterality: N/A;  . ILEOSTOMY CLOSURE N/A 08/15/2018   Procedure: DILATION OF ILEOSTOMY STRICTURE;  Surgeon: Herbert Pun, MD;  Location: [facility] ORS;  Service: General;  Laterality: N/A;  . LAPAROTOMY Right 05/04/2018   Procedure: EXPLORATORY LAPAROTOMY right colectomy right and left ostomy;  Surgeon: Herbert Pun, MD;  Location: [facility] ORS;  Service: General;  Laterality: Right;  . LUNG BIOPSY    . MASTECTOMY Left  2000, [place]  . ROTATOR CUFF REPAIR Right    [facility]    FAMILY HISTORY :   Family History  Problem Relation Age of Onset  . Breast cancer Mother 62  . Cancer Mother        Breast   . Cirrhosis Father   . Breast cancer Paternal Aunt 45  . Cancer Maternal Aunt        Breast     SOCIAL HISTORY:   Social History   Tobacco Use  . Smoking status: Former Smoker    Packs/day: 0.50    Years: 20.00    Pack years: 10.00    Types: Cigarettes     Quit date: 07/02/2012    Years since quitting: 6.8  . Smokeless tobacco: Current User    Types: Snuff  . Tobacco comment: quit 2014  Substance Use Topics  . Alcohol use: Not Currently    Comment: Occasionally  . Drug use: No    ALLERGIES:  has No Known Allergies.  MEDICATIONS:  Current Outpatient Medications  Medication Sig Dispense Refill  . albuterol (PROVENTIL HFA;VENTOLIN HFA) 108 (90 Base) MCG/ACT inhaler Inhale 2 puffs into the lungs every 6 (six) hours as needed for wheezing or shortness of breath. 1 Inhaler 2  . apixaban (ELIQUIS) 5 MG TABS tablet Take 1 tablet (5 mg total) by mouth 2 (two) times daily. 60 tablet 0  . carvedilol (COREG) 6.25 MG tablet Take 1 tablet (6.25 mg total) by mouth 2 (two) times daily. 60 tablet 3  . citalopram (CELEXA) 40 MG tablet Take 40 mg by mouth daily.    . ferrous sulfate 325 (65 FE) MG tablet Take 1 tablet (325 mg total) by mouth 2 (two) times daily with a meal. (Patient taking differently: Take 325 mg by mouth daily with breakfast. ) 60 tablet 3  . furosemide (LASIX) 40 MG tablet Take 1 tablet (40 mg total) by mouth daily. 30 tablet 0  . Multiple Vitamin (MULTIVITAMIN WITH MINERALS) TABS tablet Take 1 tablet by mouth daily. 30 tablet 1  . pantoprazole (PROTONIX) 40 MG tablet Take 1 tablet (40 mg total) by mouth 2 (two) times daily before a meal. 60 tablet 0  . potassium chloride (K-DUR) 10 MEQ tablet Take 10 mEq by mouth every other day.     [name] acetaminophen (TYLENOL) 325 MG tablet Take 2 tablets (650 mg total) by mouth every 6 (six) hours as needed for mild pain (or Fever >/= 101). (Patient not taking: Reported on 05/15/2019)    . Fluticasone-Umeclidin-Vilant 100-62.5-25 MCG/INH AEPB Inhale into the lungs.    . loperamide (IMODIUM A-D) 2 MG tablet Take 1 tablet (2 mg total) by mouth as needed for diarrhea or loose stools. (Patient not taking: Reported on 05/15/2019) 30 tablet 0  . sucralfate (CARAFATE) 1 GM/10ML suspension Take 10 mLs (1 g total) by  mouth 4 (four) times daily. (Patient not taking: Reported on 05/15/2019) 420 mL 1   No current facility-administered medications for this visit.     PHYSICAL EXAMINATION: ECOG PERFORMANCE STATUS: 1 - Symptomatic but completely ambulatory  BP 115/78   Pulse 71   Temp (!) 96.6 F (35.9 C) (Tympanic)   Resp 18   Ht '5\' 7"'$  (1.702 m)   Wt 132 lb 6.4 oz (60.1 kg)   SpO2 100%   BMI 20.74 kg/m   Filed Weights   05/15/19 1003  Weight: 132 lb 6.4 oz (60.1 kg)    Physical Exam  Constitutional: She is oriented  to person, place, and time.  Thin built moderately nourished female patient in a wheelchair.  Accompanied by daughter.  HENT:  Head: Normocephalic and atraumatic.  Mouth/Throat: Oropharynx is clear and moist. No oropharyngeal exudate.  Eyes: Pupils are equal, round, and reactive to light.  Neck: Normal range of motion. Neck supple.  Cardiovascular: Normal rate and regular rhythm.  Pulmonary/Chest: No respiratory distress. She has no wheezes.  Decreased breath sounds bilaterally.  No wheeze or crackles.  Abdominal: Soft. Bowel sounds are normal. She exhibits no distension and no mass. There is no abdominal tenderness. There is no rebound and no guarding.  Musculoskeletal: Normal range of motion.        General: No tenderness or edema.  Neurological: She is alert and oriented to person, place, and time.  Skin: Skin is warm.  Psychiatric: Affect normal.       LABORATORY DATA:  I have reviewed the data as listed    Component Value Date/Time   NA 136 05/01/2019 0923   NA 132 (L) 02/09/2015 1100   K 4.0 05/01/2019 0923   K 3.8 02/09/2015 1100   CL 107 05/01/2019 0923   CL 95 (L) 02/09/2015 1100   CO2 18 (L) 05/01/2019 0923   CO2 29 02/09/2015 1100   GLUCOSE 103 (H) 05/01/2019 0923   GLUCOSE 105 (H) 02/09/2015 1100   BUN 26 (H) 05/01/2019 0923   BUN 16 02/09/2015 1100   CREATININE 1.68 (H) 05/01/2019 0923   CREATININE 0.81 02/09/2015 1100   CALCIUM 9.3 05/01/2019 0923    CALCIUM 9.1 02/09/2015 1100   PROT 8.2 (H) 05/01/2019 0923   PROT 7.7 02/09/2015 1100   ALBUMIN 4.0 05/01/2019 0923   ALBUMIN 4.3 02/09/2015 1100   AST 23 05/01/2019 0923   AST 29 02/09/2015 1100   ALT 13 05/01/2019 0923   ALT 20 02/09/2015 1100   ALKPHOS 100 05/01/2019 0923   ALKPHOS 69 02/09/2015 1100   BILITOT 0.5 05/01/2019 0923   BILITOT 0.9 02/09/2015 1100   GFRNONAA 32 (L) 05/01/2019 0923   GFRNONAA >60 02/09/2015 1100   GFRAA 37 (L) 05/01/2019 0923   GFRAA >60 02/09/2015 1100    No results found for: SPEP, UPEP  Lab Results  Component Value Date   WBC 8.3 05/01/2019   NEUTROABS 5.1 05/01/2019   HGB 10.2 (L) 05/01/2019   HCT 32.3 (L) 05/01/2019   MCV 99.7 05/01/2019   PLT 283 05/01/2019      Chemistry      Component Value Date/Time   NA 136 05/01/2019 0923   NA 132 (L) 02/09/2015 1100   K 4.0 05/01/2019 0923   K 3.8 02/09/2015 1100   CL 107 05/01/2019 0923   CL 95 (L) 02/09/2015 1100   CO2 18 (L) 05/01/2019 0923   CO2 29 02/09/2015 1100   BUN 26 (H) 05/01/2019 0923   BUN 16 02/09/2015 1100   CREATININE 1.68 (H) 05/01/2019 0923   CREATININE 0.81 02/09/2015 1100      Component Value Date/Time   CALCIUM 9.3 05/01/2019 0923   CALCIUM 9.1 02/09/2015 1100   ALKPHOS 100 05/01/2019 0923   ALKPHOS 69 02/09/2015 1100   AST 23 05/01/2019 0923   AST 29 02/09/2015 1100   ALT 13 05/01/2019 0923   ALT 20 02/09/2015 1100   BILITOT 0.5 05/01/2019 0923   BILITOT 0.9 02/09/2015 1100       RADIOGRAPHIC STUDIES: I have personally reviewed the radiological images as listed and agreed with the findings in  the report. No results found.   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  July 2020 PET scan-no evidence of any active disease in the lung or in the esophagus or any mediastinal adenopathy.  Endoscopic ultrasound-T1 N0.   #Discussed that I would recommend if  possible mucosal resection of the stage of cancer; if not definitive radiation would be a possibility.  Left a message for Dr. Francella Solian, at [facility] to discuss options.   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  # History of ER/PR positive HER-2/neu negative breast cancer stage III [2000]- clinically no evidence of recurrence.  Stable  # Anemia- hb 9-10 /stable; NOT iron deficient; monitor for now.  Stable  # A.fib on eliquis; stable as per cardiology.  Stable  #Chronic renal insufficiency-stage III-1.68 stable  #Declines my offer to call the patient's daughter.  Discussed with nurse navigator.   # DISPOSITION: # follow up in 1 month- MD- cbc/bmp-Dr.B  Addendum: Spoke with Dr. Francella Solian who is concerned patient could potentially have a submucosal involvement/not definitive in the EUS.  If patient, cannot get mucosal resection then would recommend chemoradiation.  Will await to hear from Dr. Rolin Barry regarding mucosal resection at [facility]  # I reviewed the blood work- with the patient in detail; also reviewed the imaging independently [as summarized above]; and with the patient in detail.      No orders of the defined types were placed in this encounter.  All questions were answered. The patient knows to call the clinic with any problems, questions or concerns.      [name], MD 05/15/2019 10:41 AM

## 2019-05-15 NOTE — Assessment & Plan Note (Addendum)
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  July 2020 PET scan-no evidence of any active disease in the lung or in the esophagus or any mediastinal adenopathy.  Endoscopic ultrasound-T1 N0.   #Discussed that I would recommend if possible mucosal resection of the stage of cancer; if not definitive radiation would be a possibility.  Left a message for Dr. Francella Solian, at [facility] to discuss options.   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  # History of ER/PR positive HER-2/neu negative breast cancer stage III [2000]- clinically no evidence of recurrence.  Stable  # Anemia- hb 9-10 /stable; NOT iron deficient; monitor for now.  Stable  # A.fib on eliquis; stable as per cardiology.  Stable  #Chronic renal insufficiency-stage III-1.68 stable  #Declines my offer to call the patient's daughter.  Discussed with nurse navigator.   # DISPOSITION: # follow up in 1 month- MD- cbc/bmp-Dr.B  Addendum: Spoke with Dr. Francella Solian who is concerned patient could potentially have a submucosal involvement/not definitive in the EUS.  If patient, cannot get mucosal resection then would recommend chemoradiation.  Will await to hear from Dr. Rolin Barry regarding mucosal resection at [facility]  # I reviewed the blood work- with the patient in detail; also reviewed the imaging independently [as summarized above]; and with the patient in detail.

## 2019-05-19 ENCOUNTER — Telehealth: Payer: Self-pay

## 2019-05-19 ENCOUNTER — Telehealth: Payer: Self-pay | Admitting: Internal Medicine

## 2019-05-19 NOTE — Telephone Encounter (Signed)
Spoke to patient's daughter regarding my discussion with gastroenterologist at [facility].  Agrees with referral for [facility]-for possible Endo mucosal resection.  Referral to [facility] placed/discussed with Roderic Ovens.

## 2019-05-19 NOTE — Telephone Encounter (Signed)
Referral sent to Dr. Faustino Congress at [facility] for consideration of EMR for esophageal cancer via [place]. Called and updated Ms. Matthies on plan of care per Dr. Thurman Coyer. Francella Solian. All questions answered. Notified Dr. Francella Solian that referral has been sent.

## 2019-05-20 NOTE — Progress Notes (Signed)
Patient ID: Danielle [name], female    DOB: 1955/12/07, 63 y.o.   MRN: [id number]  HPI  Ms Fear is a 63 y/o female with a history of breast/ lung/ esophageal cancer, DM, HTN, COPD, anxiety, depression, lymphedema, previous tobacco use and chronic heart failure.   Echo report from 04/21/18 reviewed and showed an EF of 35-40% along with mild AS/ trivial AR and moderate MR.   Admitted 12/22/2018 due to COPD exacerbation. Initially needed bipap and then transitioned to room air. Given nebulizers and prednisone. IV fluids were given due to acute kidney injury. Discharged after 2 days.   She presents today for a follow-up visit with a chief complaint of minimal shortness of breath upon moderate exertion. She says that it takes "a lot" of exertion for her to get short of breath and she was able to walk to our office without any symptoms today. She has associated chronic difficulty sleeping along with this. She denies any dizziness, abdominal distention, palpitations, pedal edema, chest pain, cough, fatigue or weight gain. Has been diagnosed with esophageal cancer and is waiting on treatment plan.   Past Medical History:  Diagnosis Date  . Anxiety   . Arthritis   . Breast cancer ([place]) 2000  . Cancer ([place]) left    breast cancer 2000, chemo tx's with total mastectomy and lymph nodes resected.   . Cancer of right lung ([place]) 02/21/2016   rad tx's.   . CHF (congestive heart failure) ([place])   . COPD (chronic obstructive pulmonary disease) ([place])   . Dependence on supplemental oxygen   . Depression   . Diabetes mellitus without complication ([place])   . Heart murmur   . Hypertension   . Lung nodule   . Lymphedema   . Personal history of chemotherapy   . Personal history of radiation therapy   . Shortness of breath dyspnea    with exertion  . Status post chemotherapy 2001   left breast cancer  . Status post radiation therapy 2001   left breast cancer   Past Surgical History:  Procedure Laterality Date   . Breast Biospy Left    [facility]  . BREAST SURGERY    . COLONOSCOPY N/A 04/30/2018   Procedure: COLONOSCOPY;  Surgeon: Virgel Manifold, MD;  Location: [facility] ENDOSCOPY;  Service: Endoscopy;  Laterality: N/A;  . COLONOSCOPY N/A 07/22/2018   Procedure: COLONOSCOPY;  Surgeon: Virgel Manifold, MD;  Location: [facility] ENDOSCOPY;  Service: Endoscopy;  Laterality: N/A;  . DILATION AND CURETTAGE OF UTERUS    . ELECTROMAGNETIC NAVIGATION BROCHOSCOPY Right 04/11/2016   Procedure: ELECTROMAGNETIC NAVIGATION BRONCHOSCOPY;  Surgeon: Vilinda Boehringer, MD;  Location: [facility] ORS;  Service: Cardiopulmonary;  Laterality: Right;  . ESOPHAGOGASTRODUODENOSCOPY N/A 07/22/2018   Procedure: ESOPHAGOGASTRODUODENOSCOPY (EGD);  Surgeon: Virgel Manifold, MD;  Location: [facility] ENDOSCOPY;  Service: Endoscopy;  Laterality: N/A;  . ESOPHAGOGASTRODUODENOSCOPY (EGD) WITH PROPOFOL N/A 05/07/2018   Procedure: ESOPHAGOGASTRODUODENOSCOPY (EGD) WITH PROPOFOL;  Surgeon: Lucilla Lame, MD;  Location: [facility] ENDOSCOPY;  Service: Endoscopy;  Laterality: N/A;  . ESOPHAGOGASTRODUODENOSCOPY (EGD) WITH PROPOFOL N/A 04/24/2019   Procedure: ESOPHAGOGASTRODUODENOSCOPY (EGD) WITH PROPOFOL;  Surgeon: Jonathon Bellows, MD;  Location[facility] ENDOSCOPY;  Service: Gastroenterology;  Laterality: N/A;  . EUS N/A 05/07/2019   Procedure: FULL UPPER ENDOSCOPIC ULTRASOUND (EUS) RADIAL;  Surgeon: Jola Schmidt, MD;  Location: [facility] ENDOSCOPY;  Service: Endoscopy;  Laterality: N/A;  . history of colonoscopy]    . ILEOSCOPY N/A 07/22/2018   Procedure: ILEOSCOPY THROUGH STOMA;  Surgeon: [name]  B, MD;  Location: [facility] ENDOSCOPY;  Service: Endoscopy;  Laterality: N/A;  . ILEOSTOMY    . ILEOSTOMY N/A 09/08/2018   Procedure: ILEOSTOMY REVISION POSSIBLE CREATION;  Surgeon: Herbert Pun, MD;  Location: [facility] ORS;  Service: General;  Laterality: N/A;  . ILEOSTOMY CLOSURE N/A 08/15/2018   Procedure: DILATION OF ILEOSTOMY STRICTURE;  Surgeon: Herbert Pun,  MD;  Location: [facility] ORS;  Service: General;  Laterality: N/A;  . LAPAROTOMY Right 05/04/2018   Procedure: EXPLORATORY LAPAROTOMY right colectomy right and left ostomy;  Surgeon: Herbert Pun, MD;  Location: [facility] ORS;  Service: General;  Laterality: Right;  . LUNG BIOPSY    . MASTECTOMY Left    2000, [facility]  . ROTATOR CUFF REPAIR Right    [facility]   Family History  Problem Relation Age of Onset  . Breast cancer Mother 62  . Cancer Mother        Breast   . Cirrhosis Father   . Breast cancer Paternal Aunt 65  . Cancer Maternal Aunt        Breast    Social History   Tobacco Use  . Smoking status: Former Smoker    Packs/day: 0.50    Years: 20.00    Pack years: 10.00    Types: Cigarettes    Quit date: 07/02/2012    Years since quitting: 6.8  . Smokeless tobacco: Current User    Types: Snuff  . Tobacco comment: quit 2014  Substance Use Topics  . Alcohol use: Not Currently    Comment: Occasionally   No Known Allergies  Prior to Admission medications   Medication Sig Start Date End Date Taking? Authorizing Provider  acetaminophen (TYLENOL) 325 MG tablet Take 2 tablets (650 mg total) by mouth every 6 (six) hours as needed for mild pain (or Fever >/= 101). 12/24/18  Yes Gouru, Illene Silver, MD  albuterol (PROVENTIL HFA;VENTOLIN HFA) 108 (90 Base) MCG/ACT inhaler Inhale 2 puffs into the lungs every 6 (six) hours as needed for wheezing or shortness of breath. 04/24/18  Yes Demetrios Loll, MD  apixaban (ELIQUIS) 5 MG TABS tablet Take 1 tablet (5 mg total) by mouth 2 (two) times daily. 06/23/18  Yes [name], MD  carvedilol (COREG) 6.25 MG tablet Take 1 tablet (6.25 mg total) by mouth 2 (two) times daily. 06/24/18  Yes Hackney, Tina A, FNP  citalopram (CELEXA) 40 MG tablet Take 40 mg by mouth daily.   Yes [provider]  ferrous sulfate 325 (65 FE) MG tablet Take 1 tablet (325 mg total) by mouth 2 (two) times daily with a meal. Patient taking differently: Take 325 mg by mouth  daily with breakfast.  07/02/18  Yes [name], MD  Fluticasone-Umeclidin-Vilant 100-62.5-25 MCG/INH AEPB Inhale into the lungs. 01/07/19  Yes [provider]  furosemide (LASIX) 40 MG tablet Take 1 tablet (40 mg total) by mouth daily. 12/24/18  Yes Gouru, Illene Silver, MD  loperamide (IMODIUM A-D) 2 MG tablet Take 1 tablet (2 mg total) by mouth as needed for diarrhea or loose stools. 10/04/18  Yes Herbert Pun, MD  Multiple Vitamin (MULTIVITAMIN WITH MINERALS) TABS tablet Take 1 tablet by mouth daily. 06/06/18  Yes Fritzi Mandes, MD  pantoprazole (PROTONIX) 40 MG tablet Take 1 tablet (40 mg total) by mouth 2 (two) times daily before a meal. 07/23/18  Yes Sudini, Srikar, MD  potassium chloride (K-DUR) 10 MEQ tablet Take 10 mEq by mouth every other day.    Yes [provider]  sucralfate (CARAFATE) 1  GM/10ML suspension Take 10 mLs (1 g total) by mouth 4 (four) times daily. 01/30/19  Yes Jonathon Bellows, MD    Review of Systems  Constitutional: Negative for appetite change and fatigue.  HENT: Negative for congestion, rhinorrhea and sore throat.   Eyes: Negative.   Respiratory: Positive for shortness of breath (with moderate exertion). Negative for cough and chest tightness.   Cardiovascular: Negative for chest pain, palpitations and leg swelling.  Gastrointestinal: Negative for abdominal distention and abdominal pain.  Endocrine: Negative.   Genitourinary: Negative.   Musculoskeletal: Negative for back pain and neck pain.  Skin: Negative.   Allergic/Immunologic: Negative.   Neurological: Positive for headaches. Negative for dizziness and light-headedness.  Hematological: Negative for adenopathy. Does not bruise/bleed easily.  Psychiatric/Behavioral: Positive for sleep disturbance (trouble falling asleep; sleeping on 2 pillows). Negative for dysphoric mood. The patient is not nervous/anxious.    Vitals:   05/21/19 0923  BP: 118/79  Pulse: 90  Resp: 18  SpO2: 95%   Weight: 133 lb 4 oz (60.4 kg)  Height: 5\' 6"  (1.676 m)   Wt Readings from Last 3 Encounters:  05/21/19 133 lb 4 oz (60.4 kg)  05/15/19 132 lb 6.4 oz (60.1 kg)  05/07/19 134 lb (60.8 kg)   Lab Results  Component Value Date   CREATININE 1.68 (H) 05/01/2019   CREATININE 1.37 (H) 04/29/2019   CREATININE 1.53 (H) 12/24/2018    Physical Exam Vitals signs and nursing note reviewed.  Constitutional:      Appearance: She is well-developed.  HENT:     Head: Normocephalic and atraumatic.  Neck:     Musculoskeletal: Normal range of motion and neck supple.     Vascular: No JVD.  Cardiovascular:     Rate and Rhythm: Normal rate and regular rhythm.  Pulmonary:     Effort: Pulmonary effort is normal. No respiratory distress.     Breath sounds: No wheezing or rales.  Abdominal:     General: There is no distension.     Palpations: Abdomen is soft.  Musculoskeletal:     Right lower leg: She exhibits no tenderness. No edema.     Left lower leg: She exhibits no tenderness. No edema.  Skin:    General: Skin is warm and dry.  Neurological:     Mental Status: She is alert and oriented to person, place, and time.  Psychiatric:        Behavior: Behavior normal.    Assessment & Plan:  1: Chronic heart failure with reduced ejection fraction- - NYHA class II - euvolemic today - weighing daily and she was reminded to call for an overnight weight gain of >2 pounds or a weekly weight gain of >5 pounds - weight up 4 pounds from last time she was here ~ 6 months ago - saw cardiology [name]) 04/13/2019 - consider entresto use if renal function stabilizes; currently being worked up for esophageal cancer - BNP 12/22/2018 was 80.0  2: HTN- - BP looks good today - sees PCP (@ [facility]) - BMP 05/01/2019 reviewed and showed sodium 136, potassium 4.0, creatinine 1.68 and GFR 37  3: Squamous cell carcinoma of esophagus- - saw oncology Rogue Bussing) 05/15/2019 - awaiting on [facility] appointment  about possible resection  Patient did not bring her medications nor a list. Each medication was verbally reviewed with the patient and she was encouraged to bring the bottles to every visit to confirm accuracy of list.  Will not make a return appointment for  patient at this time. Advised patient that she could call back at anytime to make another appointment. Patient was comfortable with this plan.

## 2019-05-21 ENCOUNTER — Other Ambulatory Visit: Payer: Self-pay

## 2019-05-21 ENCOUNTER — Encounter: Payer: Self-pay | Admitting: Family

## 2019-05-21 ENCOUNTER — Ambulatory Visit: Payer: Medicare Other | Attending: Family | Admitting: Family

## 2019-05-21 VITALS — BP 118/79 | HR 90 | Resp 18 | Ht 66.0 in | Wt 133.2 lb

## 2019-05-21 DIAGNOSIS — Z923 Personal history of irradiation: Secondary | ICD-10-CM | POA: Insufficient documentation

## 2019-05-21 DIAGNOSIS — Z87891 Personal history of nicotine dependence: Secondary | ICD-10-CM | POA: Insufficient documentation

## 2019-05-21 DIAGNOSIS — I509 Heart failure, unspecified: Secondary | ICD-10-CM | POA: Diagnosis present

## 2019-05-21 DIAGNOSIS — Z803 Family history of malignant neoplasm of breast: Secondary | ICD-10-CM | POA: Diagnosis not present

## 2019-05-21 DIAGNOSIS — I110 Hypertensive heart disease with heart failure: Secondary | ICD-10-CM | POA: Insufficient documentation

## 2019-05-21 DIAGNOSIS — Z7901 Long term (current) use of anticoagulants: Secondary | ICD-10-CM | POA: Diagnosis not present

## 2019-05-21 DIAGNOSIS — I5022 Chronic systolic (congestive) heart failure: Secondary | ICD-10-CM | POA: Diagnosis not present

## 2019-05-21 DIAGNOSIS — Z853 Personal history of malignant neoplasm of breast: Secondary | ICD-10-CM | POA: Insufficient documentation

## 2019-05-21 DIAGNOSIS — I890 Lymphedema, not elsewhere classified: Secondary | ICD-10-CM | POA: Insufficient documentation

## 2019-05-21 DIAGNOSIS — Z7951 Long term (current) use of inhaled steroids: Secondary | ICD-10-CM | POA: Diagnosis not present

## 2019-05-21 DIAGNOSIS — Z9981 Dependence on supplemental oxygen: Secondary | ICD-10-CM | POA: Insufficient documentation

## 2019-05-21 DIAGNOSIS — E119 Type 2 diabetes mellitus without complications: Secondary | ICD-10-CM | POA: Diagnosis not present

## 2019-05-21 DIAGNOSIS — F329 Major depressive disorder, single episode, unspecified: Secondary | ICD-10-CM | POA: Insufficient documentation

## 2019-05-21 DIAGNOSIS — J449 Chronic obstructive pulmonary disease, unspecified: Secondary | ICD-10-CM | POA: Diagnosis not present

## 2019-05-21 DIAGNOSIS — F419 Anxiety disorder, unspecified: Secondary | ICD-10-CM | POA: Diagnosis not present

## 2019-05-21 DIAGNOSIS — Z79899 Other long term (current) drug therapy: Secondary | ICD-10-CM | POA: Diagnosis not present

## 2019-05-21 DIAGNOSIS — Z8379 Family history of other diseases of the digestive system: Secondary | ICD-10-CM | POA: Diagnosis not present

## 2019-05-21 DIAGNOSIS — Z9221 Personal history of antineoplastic chemotherapy: Secondary | ICD-10-CM | POA: Insufficient documentation

## 2019-05-21 DIAGNOSIS — C159 Malignant neoplasm of esophagus, unspecified: Secondary | ICD-10-CM | POA: Diagnosis not present

## 2019-05-21 DIAGNOSIS — M1990 Unspecified osteoarthritis, unspecified site: Secondary | ICD-10-CM | POA: Insufficient documentation

## 2019-05-21 DIAGNOSIS — I10 Essential (primary) hypertension: Secondary | ICD-10-CM

## 2019-05-21 DIAGNOSIS — C155 Malignant neoplasm of lower third of esophagus: Secondary | ICD-10-CM

## 2019-05-21 NOTE — Patient Instructions (Signed)
Continue weighing daily and call for an overnight weight gain of > 2 pounds or a weekly weight gain of >5 pounds. 

## 2019-05-26 NOTE — Anesthesia Postprocedure Evaluation (Signed)
Anesthesia Post Note  Patient: Danielle [name]  Procedure(s) Performed: FULL UPPER ENDOSCOPIC ULTRASOUND (EUS) RADIAL (N/A )  Patient location during evaluation: PACU Anesthesia Type: General Level of consciousness: awake and alert Pain management: pain level controlled Vital Signs Assessment: post-procedure vital signs reviewed and stable Respiratory status: spontaneous breathing, nonlabored ventilation, respiratory function stable and patient connected to nasal cannula oxygen Cardiovascular status: blood pressure returned to baseline and stable Postop Assessment: no apparent nausea or vomiting Anesthetic complications: no     Last Vitals:  Vitals:   05/07/19 1217 05/07/19 1344  BP: 124/78 (!) 112/58  Pulse: 73 71  Resp: 20 (!) 22  Temp: (!) 36.1 C (!) 36.3 C  SpO2: 99% 100%    Last Pain:  Vitals:   05/08/19 0747  TempSrc:   PainSc: 0-No pain                 Molli Barrows

## 2019-05-27 ENCOUNTER — Other Ambulatory Visit: Payer: Self-pay | Admitting: Family Medicine

## 2019-05-27 DIAGNOSIS — Z13820 Encounter for screening for osteoporosis: Secondary | ICD-10-CM

## 2019-06-01 ENCOUNTER — Ambulatory Visit: Payer: Medicaid Other | Admitting: Internal Medicine

## 2019-06-01 ENCOUNTER — Other Ambulatory Visit: Payer: Medicaid Other

## 2019-06-11 ENCOUNTER — Other Ambulatory Visit: Payer: Self-pay

## 2019-06-12 ENCOUNTER — Inpatient Hospital Stay: Payer: Medicare Other | Attending: Internal Medicine

## 2019-06-12 ENCOUNTER — Encounter: Payer: Self-pay | Admitting: Internal Medicine

## 2019-06-12 ENCOUNTER — Inpatient Hospital Stay (HOSPITAL_BASED_OUTPATIENT_CLINIC_OR_DEPARTMENT_OTHER): Payer: Medicare Other | Admitting: Internal Medicine

## 2019-06-12 ENCOUNTER — Other Ambulatory Visit: Payer: Self-pay

## 2019-06-12 DIAGNOSIS — Z87891 Personal history of nicotine dependence: Secondary | ICD-10-CM | POA: Diagnosis not present

## 2019-06-12 DIAGNOSIS — E1151 Type 2 diabetes mellitus with diabetic peripheral angiopathy without gangrene: Secondary | ICD-10-CM | POA: Diagnosis not present

## 2019-06-12 DIAGNOSIS — I509 Heart failure, unspecified: Secondary | ICD-10-CM | POA: Insufficient documentation

## 2019-06-12 DIAGNOSIS — Z79899 Other long term (current) drug therapy: Secondary | ICD-10-CM | POA: Insufficient documentation

## 2019-06-12 DIAGNOSIS — D631 Anemia in chronic kidney disease: Secondary | ICD-10-CM | POA: Diagnosis not present

## 2019-06-12 DIAGNOSIS — Z79810 Long term (current) use of selective estrogen receptor modulators (SERMs): Secondary | ICD-10-CM | POA: Diagnosis not present

## 2019-06-12 DIAGNOSIS — I739 Peripheral vascular disease, unspecified: Secondary | ICD-10-CM | POA: Insufficient documentation

## 2019-06-12 DIAGNOSIS — F418 Other specified anxiety disorders: Secondary | ICD-10-CM | POA: Diagnosis not present

## 2019-06-12 DIAGNOSIS — D649 Anemia, unspecified: Secondary | ICD-10-CM | POA: Diagnosis not present

## 2019-06-12 DIAGNOSIS — Z853 Personal history of malignant neoplasm of breast: Secondary | ICD-10-CM | POA: Diagnosis not present

## 2019-06-12 DIAGNOSIS — C155 Malignant neoplasm of lower third of esophagus: Secondary | ICD-10-CM | POA: Diagnosis present

## 2019-06-12 DIAGNOSIS — Z9221 Personal history of antineoplastic chemotherapy: Secondary | ICD-10-CM | POA: Diagnosis not present

## 2019-06-12 DIAGNOSIS — Z7901 Long term (current) use of anticoagulants: Secondary | ICD-10-CM | POA: Diagnosis not present

## 2019-06-12 DIAGNOSIS — N184 Chronic kidney disease, stage 4 (severe): Secondary | ICD-10-CM

## 2019-06-12 DIAGNOSIS — J449 Chronic obstructive pulmonary disease, unspecified: Secondary | ICD-10-CM | POA: Diagnosis not present

## 2019-06-12 DIAGNOSIS — Z803 Family history of malignant neoplasm of breast: Secondary | ICD-10-CM | POA: Diagnosis not present

## 2019-06-12 DIAGNOSIS — I129 Hypertensive chronic kidney disease with stage 1 through stage 4 chronic kidney disease, or unspecified chronic kidney disease: Secondary | ICD-10-CM | POA: Insufficient documentation

## 2019-06-12 DIAGNOSIS — C3411 Malignant neoplasm of upper lobe, right bronchus or lung: Secondary | ICD-10-CM

## 2019-06-12 LAB — IRON AND TIBC
Iron: 289 ug/dL — ABNORMAL HIGH (ref 28–170)
Saturation Ratios: 77 % — ABNORMAL HIGH (ref 10.4–31.8)
TIBC: 378 ug/dL (ref 250–450)
UIBC: 89 ug/dL

## 2019-06-12 LAB — COMPREHENSIVE METABOLIC PANEL
ALT: 14 U/L (ref 0–44)
AST: 21 U/L (ref 15–41)
Albumin: 3.7 g/dL (ref 3.5–5.0)
Alkaline Phosphatase: 67 U/L (ref 38–126)
Anion gap: 8 (ref 5–15)
BUN: 46 mg/dL — ABNORMAL HIGH (ref 8–23)
CO2: 14 mmol/L — ABNORMAL LOW (ref 22–32)
Calcium: 9.2 mg/dL (ref 8.9–10.3)
Chloride: 112 mmol/L — ABNORMAL HIGH (ref 98–111)
Creatinine, Ser: 2.44 mg/dL — ABNORMAL HIGH (ref 0.44–1.00)
GFR calc Af Amer: 23 mL/min — ABNORMAL LOW (ref >60)
GFR calc non Af Amer: 20 mL/min — ABNORMAL LOW (ref >60)
Glucose, Bld: 108 mg/dL — ABNORMAL HIGH (ref 70–99)
Potassium: 4.7 mmol/L (ref 3.5–5.1)
Sodium: 134 mmol/L — ABNORMAL LOW (ref 135–145)
Total Bilirubin: 0.4 mg/dL (ref 0.3–1.2)
Total Protein: 7.6 g/dL (ref 6.5–8.1)

## 2019-06-12 LAB — CBC WITH DIFFERENTIAL/PLATELET
Abs Immature Granulocytes: 0.02 10*3/uL (ref 0.00–0.07)
Basophils Absolute: 0.1 10*3/uL (ref 0.0–0.1)
Basophils Relative: 1 %
Eosinophils Absolute: 0.4 10*3/uL (ref 0.0–0.5)
Eosinophils Relative: 5 %
HCT: 27.1 % — ABNORMAL LOW (ref 36.0–46.0)
Hemoglobin: 8.4 g/dL — ABNORMAL LOW (ref 12.0–15.0)
Immature Granulocytes: 0 %
Lymphocytes Relative: 23 %
Lymphs Abs: 2 10*3/uL (ref 0.7–4.0)
MCH: 32.8 pg (ref 26.0–34.0)
MCHC: 31 g/dL (ref 30.0–36.0)
MCV: 105.9 fL — ABNORMAL HIGH (ref 80.0–100.0)
Monocytes Absolute: 0.8 10*3/uL (ref 0.1–1.0)
Monocytes Relative: 9 %
Neutro Abs: 5.6 10*3/uL (ref 1.7–7.7)
Neutrophils Relative %: 62 %
Platelets: 308 10*3/uL (ref 150–400)
RBC: 2.56 MIL/uL — ABNORMAL LOW (ref 3.87–5.11)
RDW: 14.2 % (ref 11.5–15.5)
WBC: 9 10*3/uL (ref 4.0–10.5)
nRBC: 0 % (ref 0.0–0.2)

## 2019-06-12 LAB — LACTATE DEHYDROGENASE: LDH: 92 U/L — ABNORMAL LOW (ref 98–192)

## 2019-06-12 LAB — FERRITIN: Ferritin: 24 ng/mL (ref 11–307)

## 2019-06-12 NOTE — Progress Notes (Signed)
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 A. LUNG MASS, RIGHT; CT-GUIDED BIOPSY: - ADENOCARCINOMA, ACINAR AND PAPILLARY MORPHOLOGIES. S/p RT [finished Dec 2017] s/p RT [DEC 2017]; AUG 2019- PET-right middle lobe radiation changes no evidence of recurrence.  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  T1b- submucosal involvement; N0- stage I [[name]; [facility]]  # 2000-  Left breast cancer [T3N1- stage III] mastecomy s/p RT; s/p chemo; Tamoxifen  # NOV 111 2017- Mol testing [RML- adeno ca]  # 2019-July-aug 2019-ischemic colitis status post right hemicolectomy [Dr.[name]]; acute renal failure;  -----------------------------------------------------------------    DIAGNOSIS: RML Lung ca adeno [stage I] #squamous cell carcinoma esophagus [question stage]   CURRENT/MOST RECENT THERAPY: surveillance       Breast cancer in female [facility])  07/24/1999 Initial Diagnosis   Breast cancer in female ([place]) T3 N1 M0 tumor ER/PR positive   08/24/1999 -  Anti-estrogen oral therapy    Started tamoxifen   08/24/1999 Surgery   Status post modified radical mastectomy of left breast    Chemotherapy      Radiation Therapy     11/24/2003 - 02/20/2009 Anti-estrogen oral therapy   Started Aromasin   Carcinoma of overlapping sites of left breast in female, estrogen receptor positive ([place])  06/22/2016 Initial Diagnosis   Cancer of overlapping sites of left female breast ([place])   Malignant neoplasm of upper lobe of right lung ([place])  Malignant neoplasm of lower third of esophagus ([place])  05/01/2019 Initial Diagnosis   Malignant neoplasm of lower third of esophagus ([place])       INTERVAL HISTORY:  Danielle [name] 63 y.o.  female pleasant patient above history of lung cancer stage I status post SBRT 2017; and also a remote history of breast cancer is here for follow-up; with new diagnosis of stage I squamous cell esophagus cancer.  Patient continues to have intermittent difficulty swallowing.  She is able to swallow liquids well.  No blood in stools or black or stools.  No diarrhea.  She is awaiting evaluation with [facility] for possible Endo mucosal resection on September 13.  Patient has chronic shortness of breath chronic cough.  Not any worse.  No swelling in the legs.   Review of Systems  Constitutional: Positive for malaise/fatigue and weight loss. Negative for chills, diaphoresis and fever.  HENT: Negative for nosebleeds and sore throat.   Eyes: Negative for double vision.  Respiratory: Positive for cough and shortness of breath. Negative for hemoptysis, sputum production and wheezing.   Cardiovascular: Negative for chest pain, palpitations, orthopnea and leg swelling.  Gastrointestinal: Negative for abdominal pain, blood in stool,  constipation, diarrhea, heartburn, melena, nausea and vomiting.       Dysphagia.  Genitourinary: Negative for dysuria, frequency and urgency.  Musculoskeletal: Negative for back pain and joint pain.  Skin: Negative.  Negative for itching and  rash.  Neurological: Negative for dizziness, tingling, focal weakness, weakness and headaches.  Endo/Heme/Allergies: Does not bruise/bleed easily.  Psychiatric/Behavioral: Negative for depression. The patient is not nervous/anxious and does not have insomnia.       PAST MEDICAL HISTORY :  Past Medical History:  Diagnosis Date  . Anxiety   . Arthritis   . Breast cancer ([place]) 2000  . Cancer ([place]) left    breast cancer 2000, chemo tx's with total mastectomy and lymph nodes resected.   . Cancer of right lung ([place]) 02/21/2016   rad tx's.   . CHF (congestive heart failure) ([place])   . COPD (chronic obstructive pulmonary disease) ([place])   . Dependence on supplemental oxygen   . Depression   . Diabetes mellitus without complication ([place])   . Heart murmur   . Hypertension   . Lung nodule   . Lymphedema   . Personal history of chemotherapy   . Personal history of radiation therapy   . Shortness of breath dyspnea    with exertion  . Status post chemotherapy 2001   left breast cancer  . Status post radiation therapy 2001   left breast cancer    PAST SURGICAL HISTORY :   Past Surgical History:  Procedure Laterality Date  . Breast Biospy Left    [facility]  . BREAST SURGERY    . COLONOSCOPY N/A 04/30/2018   Procedure: COLONOSCOPY;  Surgeon: Virgel Manifold, MD;  Location: [facility] ENDOSCOPY;  Service: Endoscopy;  Laterality: N/A;  . COLONOSCOPY N/A 07/22/2018   Procedure: COLONOSCOPY;  Surgeon: Virgel Manifold, MD;  Location: [facility] ENDOSCOPY;  Service: Endoscopy;  Laterality: N/A;  . DILATION AND CURETTAGE OF UTERUS    . ELECTROMAGNETIC NAVIGATION BROCHOSCOPY Right 04/11/2016   Procedure: ELECTROMAGNETIC NAVIGATION BRONCHOSCOPY;  Surgeon: Vilinda Boehringer, MD;  Location: [facility] ORS;  Service: Cardiopulmonary;  Laterality: Right;  . ESOPHAGOGASTRODUODENOSCOPY N/A 07/22/2018   Procedure: ESOPHAGOGASTRODUODENOSCOPY (EGD);  Surgeon: Virgel Manifold, MD;  Location: [facility] ENDOSCOPY;  Service:  Endoscopy;  Laterality: N/A;  . ESOPHAGOGASTRODUODENOSCOPY (EGD) WITH PROPOFOL N/A 05/07/2018   Procedure: ESOPHAGOGASTRODUODENOSCOPY (EGD) WITH PROPOFOL;  Surgeon: Lucilla Lame, MD;  Location: [facility] ENDOSCOPY;  Service: Endoscopy;  Laterality: N/A;  . ESOPHAGOGASTRODUODENOSCOPY (EGD) WITH PROPOFOL N/A 04/24/2019   Procedure: ESOPHAGOGASTRODUODENOSCOPY (EGD) WITH PROPOFOL;  Surgeon: Jonathon Bellows, MD;  Location[facility] ENDOSCOPY;  Service: Gastroenterology;  Laterality: N/A;  . EUS N/A 05/07/2019   Procedure: FULL UPPER ENDOSCOPIC ULTRASOUND (EUS) RADIAL;  Surgeon: Jola Schmidt, MD;  Location: [facility] ENDOSCOPY;  Service: Endoscopy;  Laterality: N/A;  . history of colonoscopy]    . ILEOSCOPY N/A 07/22/2018   Procedure: ILEOSCOPY THROUGH STOMA;  Surgeon: Virgel Manifold, MD;  Location: [facility] ENDOSCOPY;  Service: Endoscopy;  Laterality: N/A;  . ILEOSTOMY    . ILEOSTOMY N/A 09/08/2018   Procedure: ILEOSTOMY REVISION POSSIBLE CREATION;  Surgeon: Herbert Pun, MD;  Location: [facility] ORS;  Service: General;  Laterality: N/A;  . ILEOSTOMY CLOSURE N/A 08/15/2018   Procedure: DILATION OF ILEOSTOMY STRICTURE;  Surgeon: Herbert Pun, MD;  Location: [facility] ORS;  Service: General;  Laterality: N/A;  . LAPAROTOMY Right 05/04/2018   Procedure: EXPLORATORY LAPAROTOMY right colectomy right and left ostomy;  Surgeon: Herbert Pun, MD;  Location: [facility] ORS;  Service: General;  Laterality: Right;  .  LUNG BIOPSY    . MASTECTOMY Left    2000, [facility]  . ROTATOR CUFF REPAIR Right    [facility]    FAMILY HISTORY :   Family History  Problem Relation Age of Onset  . Breast cancer Mother 64  . Cancer Mother        Breast   . Cirrhosis Father   . Breast cancer Paternal Aunt 36  . Cancer Maternal Aunt        Breast     SOCIAL HISTORY:   Social History   Tobacco Use  . Smoking status: Former Smoker    Packs/day: 0.50    Years: 20.00    Pack years: 10.00    Types: Cigarettes    Quit date: 07/02/2012     Years since quitting: 6.9  . Smokeless tobacco: Current User    Types: Snuff  . Tobacco comment: quit 2014  Substance Use Topics  . Alcohol use: Not Currently    Comment: Occasionally  . Drug use: No    ALLERGIES:  has No Known Allergies.  MEDICATIONS:  Current Outpatient Medications  Medication Sig Dispense Refill  . acetaminophen (TYLENOL) 325 MG tablet Take 2 tablets (650 mg total) by mouth every 6 (six) hours as needed for mild pain (or Fever >/= 101).    [name] albuterol (PROVENTIL HFA;VENTOLIN HFA) 108 (90 Base) MCG/ACT inhaler Inhale 2 puffs into the lungs every 6 (six) hours as needed for wheezing or shortness of breath. 1 Inhaler 2  . apixaban (ELIQUIS) 5 MG TABS tablet Take 1 tablet (5 mg total) by mouth 2 (two) times daily. 60 tablet 0  . carvedilol (COREG) 6.25 MG tablet Take 1 tablet (6.25 mg total) by mouth 2 (two) times daily. 60 tablet 3  . citalopram (CELEXA) 40 MG tablet Take 40 mg by mouth daily.    . ferrous sulfate 325 (65 FE) MG tablet Take 1 tablet (325 mg total) by mouth 2 (two) times daily with a meal. (Patient taking differently: Take 325 mg by mouth daily with breakfast. ) 60 tablet 3  . Fluticasone-Umeclidin-Vilant 100-62.5-25 MCG/INH AEPB Inhale into the lungs.    . furosemide (LASIX) 40 MG tablet Take 1 tablet (40 mg total) by mouth daily. 30 tablet 0  . Multiple Vitamin (MULTIVITAMIN WITH MINERALS) TABS tablet Take 1 tablet by mouth daily. 30 tablet 1  . pantoprazole (PROTONIX) 40 MG tablet Take 1 tablet (40 mg total) by mouth 2 (two) times daily before a meal. 60 tablet 0  . potassium chloride (K-DUR) 10 MEQ tablet Take 10 mEq by mouth every other day.     . loperamide (IMODIUM A-D) 2 MG tablet Take 1 tablet (2 mg total) by mouth as needed for diarrhea or loose stools. (Patient not taking: Reported on 06/12/2019) 30 tablet 0  . sucralfate (CARAFATE) 1 GM/10ML suspension Take 10 mLs (1 g total) by mouth 4 (four) times daily. 420 mL 1   No current  facility-administered medications for this visit.     PHYSICAL EXAMINATION: ECOG PERFORMANCE STATUS: 1 - Symptomatic but completely ambulatory  BP 99/64 (BP Location: Right Arm, Patient Position: Sitting, Cuff Size: Normal)   Pulse 78   Temp (!) 96.5 F (35.8 C) (Tympanic)   Resp 20   Ht '5\' 6"'$  (1.676 m)   Wt 129 lb (58.5 kg)   BMI 20.82 kg/m   Filed Weights   06/12/19 1047  Weight: 129 lb (58.5 kg)    Physical Exam  Constitutional: She is  oriented to person, place, and time.  Thin built moderately nourished female patient.  She is walking herself.  She is alone.  HENT:  Head: Normocephalic and atraumatic.  Mouth/Throat: Oropharynx is clear and moist. No oropharyngeal exudate.  Eyes: Pupils are equal, round, and reactive to light.  Neck: Normal range of motion. Neck supple.  Cardiovascular: Normal rate and regular rhythm.  Pulmonary/Chest: No respiratory distress. She has no wheezes.  Decreased breath sounds bilaterally.  No wheeze or crackles.  Abdominal: Soft. Bowel sounds are normal. She exhibits no distension and no mass. There is no abdominal tenderness. There is no rebound and no guarding.  Musculoskeletal: Normal range of motion.        General: No tenderness or edema.  Neurological: She is alert and oriented to person, place, and time.  Skin: Skin is warm.  Psychiatric: Affect normal.       LABORATORY DATA:  I have reviewed the data as listed    Component Value Date/Time   NA 134 (L) 06/12/2019 1020   NA 132 (L) 02/09/2015 1100   K 4.7 06/12/2019 1020   K 3.8 02/09/2015 1100   CL 112 (H) 06/12/2019 1020   CL 95 (L) 02/09/2015 1100   CO2 14 (L) 06/12/2019 1020   CO2 29 02/09/2015 1100   GLUCOSE 108 (H) 06/12/2019 1020   GLUCOSE 105 (H) 02/09/2015 1100   BUN 46 (H) 06/12/2019 1020   BUN 16 02/09/2015 1100   CREATININE 2.44 (H) 06/12/2019 1020   CREATININE 0.81 02/09/2015 1100   CALCIUM 9.2 06/12/2019 1020   CALCIUM 9.1 02/09/2015 1100   PROT 7.6  06/12/2019 1020   PROT 7.7 02/09/2015 1100   ALBUMIN 3.7 06/12/2019 1020   ALBUMIN 4.3 02/09/2015 1100   AST 21 06/12/2019 1020   AST 29 02/09/2015 1100   ALT 14 06/12/2019 1020   ALT 20 02/09/2015 1100   ALKPHOS 67 06/12/2019 1020   ALKPHOS 69 02/09/2015 1100   BILITOT 0.4 06/12/2019 1020   BILITOT 0.9 02/09/2015 1100   GFRNONAA 20 (L) 06/12/2019 1020   GFRNONAA >60 02/09/2015 1100   GFRAA 23 (L) 06/12/2019 1020   GFRAA >60 02/09/2015 1100    No results found for: SPEP, UPEP  Lab Results  Component Value Date   WBC 9.0 06/12/2019   NEUTROABS 5.6 06/12/2019   HGB 8.4 (L) 06/12/2019   HCT 27.1 (L) 06/12/2019   MCV 105.9 (H) 06/12/2019   PLT 308 06/12/2019      Chemistry      Component Value Date/Time   NA 134 (L) 06/12/2019 1020   NA 132 (L) 02/09/2015 1100   K 4.7 06/12/2019 1020   K 3.8 02/09/2015 1100   CL 112 (H) 06/12/2019 1020   CL 95 (L) 02/09/2015 1100   CO2 14 (L) 06/12/2019 1020   CO2 29 02/09/2015 1100   BUN 46 (H) 06/12/2019 1020   BUN 16 02/09/2015 1100   CREATININE 2.44 (H) 06/12/2019 1020   CREATININE 0.81 02/09/2015 1100      Component Value Date/Time   CALCIUM 9.2 06/12/2019 1020   CALCIUM 9.1 02/09/2015 1100   ALKPHOS 67 06/12/2019 1020   ALKPHOS 69 02/09/2015 1100   AST 21 06/12/2019 1020   AST 29 02/09/2015 1100   ALT 14 06/12/2019 1020   ALT 20 02/09/2015 1100   BILITOT 0.4 06/12/2019 1020   BILITOT 0.9 02/09/2015 1100       RADIOGRAPHIC STUDIES: I have personally reviewed the radiological images as  listed and agreed with the findings in the report. No results found.   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  July 2020 PET scan-no evidence of any active disease in the lung or in the esophagus or any mediastinal adenopathy.  Endoscopic ultrasound-T1 N0.  Stable.  # Patient awaiting evaluation with Dr. Harl Bowie; at [facility] on September 14  for possible Endo mucosal resection.  Discussed if for the reason Endo mucosal resection is not feasible-radiation might be an option.  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  July iron saturation 26%; will recheck iron studies-recommend IV Feraheme.  Continue p.o. iron for now.  #  Right upper lobe stage I adenocarcinoma the lung status post SBRT 2017; July 2020 PET scan-no evidence of recurrence.  Stable  # History of ER/PR positive HER-2/neu negative breast cancer stage III [2000]- clinically no evidence of recurrence.  Stable  #Peripheral vascular disease on eliquis; stable as per cardiology.  #Chronic renal insufficiency-stage III-IV-creatinine today is 2.44; worse.  Recommend cutting down on the Lasix to 20 mg as recommended by cardiology.  Recommend call cardiology for further recommendations.  # DISPOSITION: # follow up in 2 month- MD- cbc/bmp-Dr.B      Orders Placed This Encounter  Procedures  . CBC with Differential/Platelet    Standing Status:   Future    Standing Expiration Date:   06/11/2020  . Comprehensive metabolic panel    Standing Status:   Future    Standing Expiration Date:   06/11/2020  . Iron and TIBC    Standing Status:   Future    Number of Occurrences:   1    Standing Expiration Date:   06/11/2020  . Ferritin    Standing Status:   Future    Number of Occurrences:   1    Standing Expiration Date:   06/11/2020  . Lactate dehydrogenase    Standing Status:   Future    Number of Occurrences:   1    Standing Expiration Date:   06/11/2020   All questions were answered. The patient knows to call the clinic with any problems, questions or concerns.      [name], MD 06/12/2019 12:07 PM

## 2019-06-12 NOTE — Patient Instructions (Signed)
#   call heart doctor to change the lasix/furosemide to 20mg /day.

## 2019-06-12 NOTE — Assessment & Plan Note (Addendum)
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  July 2020 PET scan-no evidence of any active disease in the lung or in the esophagus or any mediastinal adenopathy.  Endoscopic ultrasound-T1 N0.  Stable.  # Patient awaiting evaluation with Dr. Harl Bowie; at [facility] on September 14 for possible Endo mucosal resection.  Discussed if for the reason Endo mucosal resection is not feasible-radiation might be an option.  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  July iron saturation 26%; will recheck iron studies-recommend IV Feraheme.  Continue p.o. iron for now.  #  Right upper lobe stage I adenocarcinoma the lung status post SBRT 2017; July 2020 PET scan-no evidence of recurrence.  Stable  # History of ER/PR positive HER-2/neu negative breast cancer stage III [2000]- clinically no evidence of recurrence.  Stable  #Peripheral vascular disease on eliquis; stable as per cardiology.  #Chronic renal insufficiency-stage III-IV-creatinine today is 2.44; worse.  Recommend cutting down on the Lasix to 20 mg as recommended by cardiology.  Recommend call cardiology for further recommendations.  # DISPOSITION: # follow up in 2 month- MD- cbc/bmp-Dr.B

## 2019-06-13 ENCOUNTER — Telehealth: Payer: Self-pay | Admitting: Internal Medicine

## 2019-06-13 NOTE — Telephone Encounter (Signed)
Spoke to patient's daughter regarding recent visit/labs.-Hemoglobin low at 8.4; No evidence of iron deficiency.  Continue p.o. iron.  Await evaluation at [facility].

## 2019-06-15 ENCOUNTER — Other Ambulatory Visit: Payer: Self-pay | Admitting: Family

## 2019-06-15 MED ORDER — FUROSEMIDE 20 MG PO TABS: 20.0000 mg | ORAL_TABLET | Freq: Every day | ORAL | 5 refills | Status: DC

## 2019-06-15 NOTE — Progress Notes (Signed)
Diuretic dose decreased due to worsening renal function.

## 2019-06-18 ENCOUNTER — Telehealth: Payer: Self-pay | Admitting: *Deleted

## 2019-06-18 ENCOUNTER — Encounter: Payer: Self-pay | Admitting: Internal Medicine

## 2019-06-18 NOTE — Telephone Encounter (Signed)
Call returned to patient regarding her daughters FMLA papers. FMLA forms have been completed and faxed. Fax confirmation received.

## 2019-06-18 NOTE — Telephone Encounter (Signed)
t alled asking about the FMLA forms for her daughter that was dropped off a week ago.Pleasae return her call [phone]

## 2019-06-18 NOTE — Telephone Encounter (Signed)
I gave them to Dr. Sharmaine Base team today for signature so they should be faxed today.

## 2019-06-19 ENCOUNTER — Inpatient Hospital Stay
Admission: EM | Admit: 2019-06-19 | Discharge: 2019-06-20 | DRG: 378 | Disposition: A | Discharge status: Hospice | Payer: Medicare Other | Attending: Internal Medicine | Admitting: Internal Medicine

## 2019-06-19 ENCOUNTER — Inpatient Hospital Stay: Payer: Medicare Other

## 2019-06-19 ENCOUNTER — Encounter: Payer: Self-pay | Admitting: Emergency Medicine

## 2019-06-19 ENCOUNTER — Other Ambulatory Visit: Payer: Self-pay

## 2019-06-19 DIAGNOSIS — Z20828 Contact with and (suspected) exposure to other viral communicable diseases: Secondary | ICD-10-CM | POA: Diagnosis present

## 2019-06-19 DIAGNOSIS — Z9981 Dependence on supplemental oxygen: Secondary | ICD-10-CM | POA: Diagnosis not present

## 2019-06-19 DIAGNOSIS — Z79899 Other long term (current) drug therapy: Secondary | ICD-10-CM | POA: Diagnosis not present

## 2019-06-19 DIAGNOSIS — E1122 Type 2 diabetes mellitus with diabetic chronic kidney disease: Secondary | ICD-10-CM | POA: Diagnosis present

## 2019-06-19 DIAGNOSIS — I5022 Chronic systolic (congestive) heart failure: Secondary | ICD-10-CM | POA: Diagnosis present

## 2019-06-19 DIAGNOSIS — C159 Malignant neoplasm of esophagus, unspecified: Secondary | ICD-10-CM | POA: Diagnosis present

## 2019-06-19 DIAGNOSIS — R531 Weakness: Secondary | ICD-10-CM | POA: Diagnosis present

## 2019-06-19 DIAGNOSIS — D649 Anemia, unspecified: Secondary | ICD-10-CM | POA: Diagnosis present

## 2019-06-19 DIAGNOSIS — R0602 Shortness of breath: Secondary | ICD-10-CM

## 2019-06-19 DIAGNOSIS — Z853 Personal history of malignant neoplasm of breast: Secondary | ICD-10-CM

## 2019-06-19 DIAGNOSIS — Z923 Personal history of irradiation: Secondary | ICD-10-CM

## 2019-06-19 DIAGNOSIS — Z7951 Long term (current) use of inhaled steroids: Secondary | ICD-10-CM | POA: Diagnosis not present

## 2019-06-19 DIAGNOSIS — Z9049 Acquired absence of other specified parts of digestive tract: Secondary | ICD-10-CM | POA: Diagnosis not present

## 2019-06-19 DIAGNOSIS — K922 Gastrointestinal hemorrhage, unspecified: Secondary | ICD-10-CM | POA: Diagnosis present

## 2019-06-19 DIAGNOSIS — Z9012 Acquired absence of left breast and nipple: Secondary | ICD-10-CM

## 2019-06-19 DIAGNOSIS — Z803 Family history of malignant neoplasm of breast: Secondary | ICD-10-CM

## 2019-06-19 DIAGNOSIS — F17220 Nicotine dependence, chewing tobacco, uncomplicated: Secondary | ICD-10-CM | POA: Diagnosis present

## 2019-06-19 DIAGNOSIS — D638 Anemia in other chronic diseases classified elsewhere: Secondary | ICD-10-CM | POA: Diagnosis present

## 2019-06-19 DIAGNOSIS — K921 Melena: Principal | ICD-10-CM | POA: Diagnosis present

## 2019-06-19 DIAGNOSIS — Z8501 Personal history of malignant neoplasm of esophagus: Secondary | ICD-10-CM | POA: Diagnosis not present

## 2019-06-19 DIAGNOSIS — J449 Chronic obstructive pulmonary disease, unspecified: Secondary | ICD-10-CM | POA: Diagnosis present

## 2019-06-19 DIAGNOSIS — I130 Hypertensive heart and chronic kidney disease with heart failure and stage 1 through stage 4 chronic kidney disease, or unspecified chronic kidney disease: Secondary | ICD-10-CM | POA: Diagnosis present

## 2019-06-19 DIAGNOSIS — Z7901 Long term (current) use of anticoagulants: Secondary | ICD-10-CM | POA: Diagnosis not present

## 2019-06-19 DIAGNOSIS — Z9221 Personal history of antineoplastic chemotherapy: Secondary | ICD-10-CM

## 2019-06-19 DIAGNOSIS — N183 Chronic kidney disease, stage 3 (moderate): Secondary | ICD-10-CM | POA: Diagnosis present

## 2019-06-19 DIAGNOSIS — D62 Acute posthemorrhagic anemia: Secondary | ICD-10-CM | POA: Diagnosis present

## 2019-06-19 DIAGNOSIS — E1151 Type 2 diabetes mellitus with diabetic peripheral angiopathy without gangrene: Secondary | ICD-10-CM | POA: Diagnosis present

## 2019-06-19 DIAGNOSIS — Z85118 Personal history of other malignant neoplasm of bronchus and lung: Secondary | ICD-10-CM

## 2019-06-19 DIAGNOSIS — I951 Orthostatic hypotension: Secondary | ICD-10-CM | POA: Diagnosis present

## 2019-06-19 LAB — CBC WITH DIFFERENTIAL/PLATELET
Abs Immature Granulocytes: 0.04 10*3/uL (ref 0.00–0.07)
Basophils Absolute: 0.1 10*3/uL (ref 0.0–0.1)
Basophils Relative: 1 %
Eosinophils Absolute: 0.3 10*3/uL (ref 0.0–0.5)
Eosinophils Relative: 4 %
HCT: 19.5 % — ABNORMAL LOW (ref 36.0–46.0)
Hemoglobin: 6 g/dL — ABNORMAL LOW (ref 12.0–15.0)
Immature Granulocytes: 1 %
Lymphocytes Relative: 27 %
Lymphs Abs: 2.3 10*3/uL (ref 0.7–4.0)
MCH: 32.6 pg (ref 26.0–34.0)
MCHC: 30.8 g/dL (ref 30.0–36.0)
MCV: 106 fL — ABNORMAL HIGH (ref 80.0–100.0)
Monocytes Absolute: 0.9 10*3/uL (ref 0.1–1.0)
Monocytes Relative: 10 %
Neutro Abs: 5 10*3/uL (ref 1.7–7.7)
Neutrophils Relative %: 57 %
Platelets: 384 10*3/uL (ref 150–400)
RBC: 1.84 MIL/uL — ABNORMAL LOW (ref 3.87–5.11)
RDW: 15.3 % (ref 11.5–15.5)
WBC: 8.6 10*3/uL (ref 4.0–10.5)
nRBC: 2.3 % — ABNORMAL HIGH (ref 0.0–0.2)

## 2019-06-19 LAB — URINALYSIS, COMPLETE (UACMP) WITH MICROSCOPIC
Bacteria, UA: NONE SEEN
Bilirubin Urine: NEGATIVE
Glucose, UA: NEGATIVE mg/dL
Hgb urine dipstick: NEGATIVE
Ketones, ur: NEGATIVE mg/dL
Nitrite: NEGATIVE
Protein, ur: NEGATIVE mg/dL
Specific Gravity, Urine: 1.011 (ref 1.005–1.030)
pH: 6 (ref 5.0–8.0)

## 2019-06-19 LAB — COMPREHENSIVE METABOLIC PANEL
ALT: 14 U/L (ref 0–44)
AST: 23 U/L (ref 15–41)
Albumin: 3.7 g/dL (ref 3.5–5.0)
Alkaline Phosphatase: 65 U/L (ref 38–126)
Anion gap: 8 (ref 5–15)
BUN: 40 mg/dL — ABNORMAL HIGH (ref 8–23)
CO2: 15 mmol/L — ABNORMAL LOW (ref 22–32)
Calcium: 9.3 mg/dL (ref 8.9–10.3)
Chloride: 110 mmol/L (ref 98–111)
Creatinine, Ser: 1.93 mg/dL — ABNORMAL HIGH (ref 0.44–1.00)
GFR calc Af Amer: 31 mL/min — ABNORMAL LOW (ref >60)
GFR calc non Af Amer: 27 mL/min — ABNORMAL LOW (ref >60)
Glucose, Bld: 97 mg/dL (ref 70–99)
Potassium: 3.8 mmol/L (ref 3.5–5.1)
Sodium: 133 mmol/L — ABNORMAL LOW (ref 135–145)
Total Bilirubin: 0.2 mg/dL — ABNORMAL LOW (ref 0.3–1.2)
Total Protein: 7 g/dL (ref 6.5–8.1)

## 2019-06-19 LAB — VITAMIN B12: Vitamin B-12: 817 pg/mL (ref 180–914)

## 2019-06-19 LAB — TROPONIN I (HIGH SENSITIVITY): Troponin I (High Sensitivity): 10 ng/L (ref <18)

## 2019-06-19 LAB — FOLATE: Folate: >100 ng/mL (ref >5.9)

## 2019-06-19 LAB — PREPARE RBC (CROSSMATCH)

## 2019-06-19 LAB — HEMOGLOBIN AND HEMATOCRIT, BLOOD
HCT: 27.1 % — ABNORMAL LOW (ref 36.0–46.0)
Hemoglobin: 8.6 g/dL — ABNORMAL LOW (ref 12.0–15.0)

## 2019-06-19 MED ORDER — GUAIFENESIN-DM 100-10 MG/5ML PO SYRP
5.0000 mL | ORAL_SOLUTION | ORAL | Status: DC | PRN
  Filled 2019-06-19: qty 5

## 2019-06-19 MED ORDER — CITALOPRAM HYDROBROMIDE 20 MG PO TABS
40.0000 mg | ORAL_TABLET | Freq: Every day | ORAL | Status: DC
  Administered 2019-06-20: 40 mg via ORAL
  Filled 2019-06-19: qty 2

## 2019-06-19 MED ORDER — ADULT MULTIVITAMIN W/MINERALS CH
1.0000 | ORAL_TABLET | Freq: Every day | ORAL | Status: DC
  Administered 2019-06-20: 1 via ORAL
  Filled 2019-06-19: qty 1

## 2019-06-19 MED ORDER — DIPHENHYDRAMINE HCL 25 MG PO CAPS
25.0000 mg | ORAL_CAPSULE | Freq: Every evening | ORAL | Status: DC | PRN
  Administered 2019-06-19: 25 mg via ORAL
  Filled 2019-06-19: qty 1

## 2019-06-19 MED ORDER — ALBUTEROL SULFATE HFA 108 (90 BASE) MCG/ACT IN AERS: 2.0000 | INHALATION_SPRAY | Freq: Four times a day (QID) | RESPIRATORY_TRACT | Status: DC | PRN

## 2019-06-19 MED ORDER — FERROUS SULFATE 325 (65 FE) MG PO TABS
325.0000 mg | ORAL_TABLET | Freq: Two times a day (BID) | ORAL | Status: DC
  Administered 2019-06-19 – 2019-06-20 (×2): 325 mg via ORAL
  Filled 2019-06-19 (×3): qty 1

## 2019-06-19 MED ORDER — ACETAMINOPHEN 325 MG PO TABS
650.0000 mg | ORAL_TABLET | Freq: Four times a day (QID) | ORAL | Status: DC | PRN
  Administered 2019-06-19: 650 mg via ORAL
  Filled 2019-06-19: qty 2

## 2019-06-19 MED ORDER — SODIUM CHLORIDE 0.9 % IV SOLN: Freq: Once | INTRAVENOUS | Status: DC

## 2019-06-19 MED ORDER — ALBUTEROL SULFATE (2.5 MG/3ML) 0.083% IN NEBU: 2.5000 mg | INHALATION_SOLUTION | Freq: Four times a day (QID) | RESPIRATORY_TRACT | Status: DC | PRN

## 2019-06-19 MED ORDER — PANTOPRAZOLE SODIUM 40 MG PO TBEC
40.0000 mg | DELAYED_RELEASE_TABLET | Freq: Two times a day (BID) | ORAL | Status: DC
  Administered 2019-06-19: 18:00:00 40 mg via ORAL
  Filled 2019-06-19: qty 1

## 2019-06-19 MED ORDER — PANTOPRAZOLE SODIUM 40 MG IV SOLR
40.0000 mg | Freq: Two times a day (BID) | INTRAVENOUS | Status: DC
  Administered 2019-06-19 – 2019-06-20 (×2): 40 mg via INTRAVENOUS
  Filled 2019-06-19 (×2): qty 40

## 2019-06-19 MED ORDER — POTASSIUM CHLORIDE ER 10 MEQ PO TBCR
10.0000 meq | EXTENDED_RELEASE_TABLET | Freq: Every day | ORAL | Status: DC
  Administered 2019-06-20: 10 meq via ORAL
  Filled 2019-06-19 (×2): qty 1

## 2019-06-19 MED ORDER — MOMETASONE FURO-FORMOTEROL FUM 200-5 MCG/ACT IN AERO
2.0000 | INHALATION_SPRAY | Freq: Two times a day (BID) | RESPIRATORY_TRACT | Status: DC
  Administered 2019-06-19 – 2019-06-20 (×2): 2 via RESPIRATORY_TRACT
  Filled 2019-06-19: qty 8.8

## 2019-06-19 NOTE — ED Notes (Signed)
Pt resting comfortably in bed. Denies any needs. Blood has about 150cc left.

## 2019-06-19 NOTE — ED Notes (Signed)
Pt complains of sob with exertion

## 2019-06-19 NOTE — ED Triage Notes (Signed)
Pt has noticed for "some time" she has become light headed when she changes positions; leading her to seek advice from her PCP yesterday. At PCP they noticed bp readings in the 70s and directed pt to come to the ED. Pt was unable to get transportation until today. Pt denies pain.

## 2019-06-19 NOTE — ED Notes (Signed)
ED TO INPATIENT HANDOFF REPORT  ED Nurse Name and Phone #: Metta Clines [phone]  S Name/Age/Gender Danielle [name] 63 y.o. female Room/Bed: ED34A/ED34A  Code Status   Code Status: Full Code  Home/SNF/Other Home Patient oriented to: self, place, time and situation Is this baseline? Yes   Triage Complete: Triage complete  Chief Complaint weakness dizziness low blood pressure  Triage Note Pt has noticed for "some time" she has become light headed when she changes positions; leading her to seek advice from her PCP yesterday. At PCP they noticed bp readings in the 70s and directed pt to come to the ED. Pt was unable to get transportation until today. Pt denies pain.    Allergies No Known Allergies  Level of Care/Admitting Diagnosis ED Disposition    ED Disposition Condition [facility]: [place] [[id number]]  Level of Care: Med-Surg [16]  Covid Evaluation: Asymptomatic Screening Protocol (No Symptoms)  Diagnosis: GI bleeding [829562]  Admitting Physician: Lang Snow [[id number]]  Attending Physician: Rufina Falco ACHIENG [[id number]]  Estimated length of stay: past midnight tomorrow  Certification:: I certify this patient will need inpatient services for at least 2 midnights  PT Class (Do Not Modify): Inpatient [101]  PT Acc Code (Do Not Modify): Private [1]       B Medical/Surgery History Past Medical History:  Diagnosis Date  . Anxiety   . Arthritis   . Breast cancer ([place]) 2000  . Cancer ([place]) left    breast cancer 2000, chemo tx's with total mastectomy and lymph nodes resected.   . Cancer of right lung ([place]) 02/21/2016   rad tx's.   . CHF (congestive heart failure) ([place])   . COPD (chronic obstructive pulmonary disease) ([place])   . Dependence on supplemental oxygen   . Depression   . Diabetes mellitus without complication ([place])   . Heart murmur   . Hypertension   . Lung nodule   . Lymphedema   . Personal history of  chemotherapy   . Personal history of radiation therapy   . Shortness of breath dyspnea    with exertion  . Status post chemotherapy 2001   left breast cancer  . Status post radiation therapy 2001   left breast cancer   Past Surgical History:  Procedure Laterality Date  . Breast Biospy Left    [facility]  . BREAST SURGERY    . COLONOSCOPY N/A 04/30/2018   Procedure: COLONOSCOPY;  Surgeon: Virgel Manifold, MD;  Location: [facility] ENDOSCOPY;  Service: Endoscopy;  Laterality: N/A;  . COLONOSCOPY N/A 07/22/2018   Procedure: COLONOSCOPY;  Surgeon: Virgel Manifold, MD;  Location: [facility] ENDOSCOPY;  Service: Endoscopy;  Laterality: N/A;  . DILATION AND CURETTAGE OF UTERUS    . ELECTROMAGNETIC NAVIGATION BROCHOSCOPY Right 04/11/2016   Procedure: ELECTROMAGNETIC NAVIGATION BRONCHOSCOPY;  Surgeon: Vilinda Boehringer, MD;  Location: [facility] ORS;  Service: Cardiopulmonary;  Laterality: Right;  . ESOPHAGOGASTRODUODENOSCOPY N/A 07/22/2018   Procedure: ESOPHAGOGASTRODUODENOSCOPY (EGD);  Surgeon: Virgel Manifold, MD;  Location: [facility] ENDOSCOPY;  Service: Endoscopy;  Laterality: N/A;  . ESOPHAGOGASTRODUODENOSCOPY (EGD) WITH PROPOFOL N/A 05/07/2018   Procedure: ESOPHAGOGASTRODUODENOSCOPY (EGD) WITH PROPOFOL;  Surgeon: Lucilla Lame, MD;  Location: [facility] ENDOSCOPY;  Service: Endoscopy;  Laterality: N/A;  . ESOPHAGOGASTRODUODENOSCOPY (EGD) WITH PROPOFOL N/A 04/24/2019   Procedure: ESOPHAGOGASTRODUODENOSCOPY (EGD) WITH PROPOFOL;  Surgeon: Jonathon Bellows, MD;  Location: [facility] ENDOSCOPY;  Service: Gastroenterology;  Laterality: N/A;  . EUS N/A 05/07/2019   Procedure: FULL UPPER ENDOSCOPIC ULTRASOUND (EUS)  RADIAL;  Surgeon: Jola Schmidt, MD;  Location: [facility] ENDOSCOPY;  Service: Endoscopy;  Laterality: N/A;  . history of colonoscopy]    . ILEOSCOPY N/A 07/22/2018   Procedure: ILEOSCOPY THROUGH STOMA;  Surgeon: Virgel Manifold, MD;  Location: [facility] ENDOSCOPY;  Service: Endoscopy;  Laterality: N/A;  . ILEOSTOMY    . ILEOSTOMY  N/A 09/08/2018   Procedure: ILEOSTOMY REVISION POSSIBLE CREATION;  Surgeon: Herbert Pun, MD;  Location: [facility] ORS;  Service: General;  Laterality: N/A;  . ILEOSTOMY CLOSURE N/A 08/15/2018   Procedure: DILATION OF ILEOSTOMY STRICTURE;  Surgeon: Herbert Pun, MD;  Location: [facility] ORS;  Service: General;  Laterality: N/A;  . LAPAROTOMY Right 05/04/2018   Procedure: EXPLORATORY LAPAROTOMY right colectomy right and left ostomy;  Surgeon: Herbert Pun, MD;  Location: [facility] ORS;  Service: General;  Laterality: Right;  . LUNG BIOPSY    . MASTECTOMY Left    2000, [facility]  . ROTATOR CUFF REPAIR Right    [facility]     A IV Location/Drains/Wounds Patient Lines/Drains/Airways Status   Active Line/Drains/Airways    Name:   Placement date:   Placement time:   Site:   Days:   Peripheral IV 06/19/19 Right Antecubital   06/19/19    0903    Antecubital   less than 1          Intake/Output Last 24 hours  Intake/Output Summary (Last 24 hours) at 06/19/2019 1629 Last data filed at 06/19/2019 1420 Gross per 24 hour  Intake 275 ml  Output -  Net 275 ml    Labs/Imaging Results for orders placed or performed during the hospital encounter of 06/19/19 (from the past 48 hour(s))  Urinalysis, Complete w Microscopic     Status: Abnormal   Collection Time: 06/19/19  8:45 AM  Result Value Ref Range   Color, Urine YELLOW (A) YELLOW   APPearance HAZY (A) CLEAR   Specific Gravity, Urine 1.011 1.005 - 1.030   pH 6.0 5.0 - 8.0   Glucose, UA NEGATIVE NEGATIVE mg/dL   Hgb urine dipstick NEGATIVE NEGATIVE   Bilirubin Urine NEGATIVE NEGATIVE   Ketones, ur NEGATIVE NEGATIVE mg/dL   Protein, ur NEGATIVE NEGATIVE mg/dL   Nitrite NEGATIVE NEGATIVE   Leukocytes,Ua TRACE (A) NEGATIVE   WBC, UA 0-5 0 - 5 WBC/hpf   Bacteria, UA NONE SEEN NONE SEEN   Squamous Epithelial / LPF 0-5 0 - 5    Comment: Performed at [facility], [address] [place], [place] [address]  CBC with Differential      Status: Abnormal   Collection Time: 06/19/19  8:56 AM  Result Value Ref Range   WBC 8.6 4.0 - 10.5 K/uL   RBC 1.84 (L) 3.87 - 5.11 MIL/uL   Hemoglobin 6.0 (L) 12.0 - 15.0 g/dL   HCT 19.5 (L) 36.0 - 46.0 %   MCV 106.0 (H) 80.0 - 100.0 fL   MCH 32.6 26.0 - 34.0 pg   MCHC 30.8 30.0 - 36.0 g/dL   RDW 15.3 11.5 - 15.5 %   Platelets 384 150 - 400 K/uL   nRBC 2.3 (H) 0.0 - 0.2 %   Neutrophils Relative % 57 %   Neutro Abs 5.0 1.7 - 7.7 K/uL   Lymphocytes Relative 27 %   Lymphs Abs 2.3 0.7 - 4.0 K/uL   Monocytes Relative 10 %   Monocytes Absolute 0.9 0.1 - 1.0 K/uL   Eosinophils Relative 4 %   Eosinophils Absolute 0.3 0.0 - 0.5 K/uL   Basophils  Relative 1 %   Basophils Absolute 0.1 0.0 - 0.1 K/uL   Immature Granulocytes 1 %   Abs Immature Granulocytes 0.04 0.00 - 0.07 K/uL    Comment: Performed at [facility], [address] [place], [place] [address]  Comprehensive metabolic panel     Status: Abnormal   Collection Time: 06/19/19  8:56 AM  Result Value Ref Range   Sodium 133 (L) 135 - 145 mmol/L   Potassium 3.8 3.5 - 5.1 mmol/L   Chloride 110 98 - 111 mmol/L   CO2 15 (L) 22 - 32 mmol/L   Glucose, Bld 97 70 - 99 mg/dL   BUN 40 (H) 8 - 23 mg/dL   Creatinine, Ser 1.93 (H) 0.44 - 1.00 mg/dL   Calcium 9.3 8.9 - 10.3 mg/dL   Total Protein 7.0 6.5 - 8.1 g/dL   Albumin 3.7 3.5 - 5.0 g/dL   AST 23 15 - 41 U/L   ALT 14 0 - 44 U/L   Alkaline Phosphatase 65 38 - 126 U/L   Total Bilirubin 0.2 (L) 0.3 - 1.2 mg/dL   GFR calc non Af Amer 27 (L) >60 mL/min   GFR calc Af Amer 31 (L) >60 mL/min   Anion gap 8 5 - 15    Comment: Performed at [facility], [place], [place] [address]  Troponin I (High Sensitivity)     Status: None   Collection Time: 06/19/19  8:56 AM  Result Value Ref Range   Troponin I (High Sensitivity) 10 <18 ng/L    Comment: (NOTE) Elevated high sensitivity troponin I (hsTnI) values and significant  changes across serial measurements may  suggest ACS but many other  chronic and acute conditions are known to elevate hsTnI results.  Refer to the "Links" section for chest pain algorithms and additional  guidance. Performed at [facility], [address] [place], [place] [address]   Folate     Status: None   Collection Time: 06/19/19  8:56 AM  Result Value Ref Range   Folate >100.0 >5.9 ng/mL    Comment: RESULT CONFIRMED BY MANUAL DILUTION MJU Performed at [facility], [address] [place], [place] [address]   Prepare RBC     Status: None   Collection Time: 06/19/19  9:39 AM  Result Value Ref Range   Order Confirmation      ORDER PROCESSED BY BLOOD BANK Performed at [facility], [address] [place], [place] [address]   Type and screen     Status: None (Preliminary result)   Collection Time: 06/19/19  9:56 AM  Result Value Ref Range   ABO/RH(D) O POS    Antibody Screen NEG    Sample Expiration 06/22/2019,2359    Unit Number [id number]    Blood Component Type RBC LR PHER1    Unit division 00    Status of Unit ISSUED    Transfusion Status OK TO TRANSFUSE    Crossmatch Result Compatible    Unit Number [id number]    Blood Component Type RBC LR PHER1    Unit division 00    Status of Unit ISSUED    Transfusion Status OK TO TRANSFUSE    Crossmatch Result      Compatible Performed at [facility], [address], [place],  [address]    Dg Chest 1 View  Result Date: 06/19/2019 CLINICAL DATA:  Shortness of breath. Hypotension. EXAM: CHEST  1 VIEW COMPARISON:  Chest x-ray  dated 12/22/2018 FINDINGS: The heart size and pulmonary vascularity are normal. Scarring in the right midzone, unchanged. No infiltrates or effusions. Aortic atherosclerosis. No acute bone abnormality. IMPRESSION: No acute cardiopulmonary disease. Scarring in the right midzone. Aortic atherosclerosis. Electronically Signed   By: Lorriane Shire M.D.   On: 06/19/2019 12:13    Pending  Labs Unresulted Labs (From admission, onward)    Start     Ordered   06/20/19 0500  CBC  Tomorrow morning,   STAT     06/19/19 1124   06/20/19 1610  Basic metabolic panel  Tomorrow morning,   STAT     06/19/19 1124   06/19/19 1126  Vitamin B12  Add-on,   AD     06/19/19 1125   06/19/19 1122  SARS CORONAVIRUS 2 (TAT 6-24 HRS) Nasopharyngeal Nasopharyngeal Swab  (Asymptomatic/Tier 2 Patients Labs)  Once,   STAT    Question Answer Comment  Is this test for diagnosis or screening Screening   Symptomatic for COVID-19 as defined by CDC No   Hospitalized for COVID-19 No   Admitted to ICU for COVID-19 No   Previously tested for COVID-19 No   Resident in a congregate (group) care setting No   Employed in healthcare setting No   Pregnant No      06/19/19 1121   06/19/19 1122  HIV antibody (Routine Testing)  Once,   STAT     06/19/19 1124          Vitals/Pain Today's Vitals   06/19/19 1420 06/19/19 1455 06/19/19 1512 06/19/19 1534  BP: (!) 87/52  (!) 99/57 (!) 95/57  Pulse: 73  70 68  Resp: (!) 21  18 17   Temp: 98.3 F (36.8 C)  98.8 F (37.1 C) 98.3 F (36.8 C)  TempSrc: Oral  Oral Oral  SpO2: 98%   97%  Weight:      Height:      PainSc:  0-No pain      Isolation Precautions No active isolations  Medications Medications  0.9 %  sodium chloride infusion ( Intravenous Not Given 06/19/19 1156)  acetaminophen (TYLENOL) tablet 650 mg (has no administration in time range)  citalopram (CELEXA) tablet 40 mg (has no administration in time range)  pantoprazole (PROTONIX) EC tablet 40 mg (has no administration in time range)  ferrous sulfate tablet 325 mg (has no administration in time range)  multivitamin with minerals tablet 1 tablet (has no administration in time range)  potassium chloride (K-DUR) CR tablet 10 mEq (has no administration in time range)  mometasone-formoterol (DULERA) 200-5 MCG/ACT inhaler 2 puff (has no administration in time range)  guaiFENesin-dextromethorphan  (ROBITUSSIN DM) 100-10 MG/5ML syrup 5 mL (has no administration in time range)  albuterol (PROVENTIL) (2.5 MG/3ML) 0.083% nebulizer solution 2.5 mg (has no administration in time range)    Mobility walks Low fall risk   Focused Assessments Hypotension/anemia; on 2nd unit of blood now; has ostomy   R Recommendations: See Admitting Provider Note  Report given to:   Additional Notes:

## 2019-06-19 NOTE — ED Provider Notes (Addendum)
[facility] Emergency Department Provider Note       Time seen: ----------------------------------------- 8:26 AM on 06/19/2019 -----------------------------------------   I have reviewed the triage vital signs and the nursing notes.  HISTORY   Chief Complaint Hypotension    HPI Danielle [name] is a 63 y.o. female with a history of anxiety, breast cancer, CHF, COPD, hypertension, lymphedema who presents to the ED for low blood pressure.  Patient has noticed for some time she became lightheaded when she changes positions.  At her primary care doctor yesterday they noticed her blood pressure readings were low and she was directed to come to the ER but she was unable to get transportation until today.  She denies any pain.  Symptoms have been going on for the last week.  Past Medical History:  Diagnosis Date  . Anxiety   . Arthritis   . Breast cancer ([place]) 2000  . Cancer ([place]) left    breast cancer 2000, chemo tx's with total mastectomy and lymph nodes resected.   . Cancer of right lung ([place]) 02/21/2016   rad tx's.   . CHF (congestive heart failure) ([place])   . COPD (chronic obstructive pulmonary disease) ([place])   . Dependence on supplemental oxygen   . Depression   . Diabetes mellitus without complication ([place])   . Heart murmur   . Hypertension   . Lung nodule   . Lymphedema   . Personal history of chemotherapy   . Personal history of radiation therapy   . Shortness of breath dyspnea    with exertion  . Status post chemotherapy 2001   left breast cancer  . Status post radiation therapy 2001   left breast cancer    Patient Active Problem List   Diagnosis Date Noted  . Anemia of chronic kidney failure, stage 4 (severe) ([place]) 06/12/2019  . Malignant neoplasm of lower third of esophagus ([name]) 05/01/2019  . Acute on chronic respiratory failure with hypoxia and hypercapnia ([place]) 12/22/2018  . 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 Reflux  esophagitis   . Iron deficiency anemia secondary to blood loss (chronic)   . SBO (small bowel obstruction) ([place]) 06/29/2018  . Chronic systolic heart failure ([place]) 06/09/2018  . HTN (hypertension) 06/09/2018  . Atrial fibrillation ([place]) 06/09/2018  . Lymphedema 06/09/2018  . Protein-calorie malnutrition, severe 06/04/2018  . Blood in stool   . Focal (segmental) acute (reversible) ischemia of large intestine ([place])   . Ulceration of intestine   . Diverticulosis of large intestine without diverticulitis   . Abnormal CT scan, colon   . Colitis   . COPD exacerbation ([place])   . Malnutrition of moderate degree 04/23/2018  . Palliative care by specialist   . DNR (do not resuscitate) discussion   . Weakness generalized   . Hyponatremia 02/10/2017  . Syncope 02/09/2017  . Lung nodule, solitary 07/25/2016  . Malignant neoplasm of upper lobe of right lung ([place]) 07/25/2016  . Carcinoma of overlapping sites of left breast in female, estrogen receptor positive ([place]) 06/22/2016  . Multiple lung nodules 06/22/2016  . Lesion of right lung   . Breast cancer in female [facility]) 02/21/2016  . Moderate COPD (chronic obstructive pulmonary disease) ([place]) 08/24/2014    Past Surgical History:  Procedure Laterality Date  . Breast Biospy Left    [facility]  . BREAST SURGERY    . COLONOSCOPY N/A 04/30/2018   Procedure: COLONOSCOPY;  Surgeon: Virgel Manifold, MD;  Location: [facility] ENDOSCOPY;  Service: Endoscopy;  Laterality: N/A;  . COLONOSCOPY N/A 07/22/2018   Procedure: COLONOSCOPY;  Surgeon: Virgel Manifold, MD;  Location: [facility] ENDOSCOPY;  Service: Endoscopy;  Laterality: N/A;  . DILATION AND CURETTAGE OF UTERUS    . ELECTROMAGNETIC NAVIGATION BROCHOSCOPY Right 04/11/2016   Procedure: ELECTROMAGNETIC NAVIGATION BRONCHOSCOPY;  Surgeon: Vilinda Boehringer, MD;  Location: [facility] ORS;  Service: Cardiopulmonary;  Laterality: Right;  . ESOPHAGOGASTRODUODENOSCOPY N/A 07/22/2018   Procedure: ESOPHAGOGASTRODUODENOSCOPY  (EGD);  Surgeon: Virgel Manifold, MD;  Location[facility] ENDOSCOPY;  Service: Endoscopy;  Laterality: N/A;  . ESOPHAGOGASTRODUODENOSCOPY (EGD) WITH PROPOFOL N/A 05/07/2018   Procedure: ESOPHAGOGASTRODUODENOSCOPY (EGD) WITH PROPOFOL;  Surgeon: Lucilla Lame, MD;  Location: [facility] ENDOSCOPY;  Service: Endoscopy;  Laterality: N/A;  . ESOPHAGOGASTRODUODENOSCOPY (EGD) WITH PROPOFOL N/A 04/24/2019   Procedure: ESOPHAGOGASTRODUODENOSCOPY (EGD) WITH PROPOFOL;  Surgeon: Jonathon Bellows, MD;  Location: [facility] ENDOSCOPY;  Service: Gastroenterology;  Laterality: N/A;  . EUS N/A 05/07/2019   Procedure: FULL UPPER ENDOSCOPIC ULTRASOUND (EUS) RADIAL;  Surgeon: Jola Schmidt, MD;  Location: [facility] ENDOSCOPY;  Service: Endoscopy;  Laterality: N/A;  . history of colonoscopy]    . ILEOSCOPY N/A 07/22/2018   Procedure: ILEOSCOPY THROUGH STOMA;  Surgeon: Virgel Manifold, MD;  Location: [facility] ENDOSCOPY;  Service: Endoscopy;  Laterality: N/A;  . ILEOSTOMY    . ILEOSTOMY N/A 09/08/2018   Procedure: ILEOSTOMY REVISION POSSIBLE CREATION;  Surgeon: Herbert Pun, MD;  Location: [facility] ORS;  Service: General;  Laterality: N/A;  . ILEOSTOMY CLOSURE N/A 08/15/2018   Procedure: DILATION OF ILEOSTOMY STRICTURE;  Surgeon: Herbert Pun, MD;  Location: [facility] ORS;  Service: General;  Laterality: N/A;  . LAPAROTOMY Right 05/04/2018   Procedure: EXPLORATORY LAPAROTOMY right colectomy right and left ostomy;  Surgeon: Herbert Pun, MD;  Location: [facility] ORS;  Service: General;  Laterality: Right;  . LUNG BIOPSY    . MASTECTOMY Left    2000, [facility]  . ROTATOR CUFF REPAIR Right    [facility]    Allergies Patient has no known allergies.  Social History Social History   Tobacco Use  . Smoking status: Former Smoker    Packs/day: 0.50    Years: 20.00    Pack years: 10.00    Types: Cigarettes    Quit date: 07/02/2012    Years since quitting: 6.9  . Smokeless tobacco: Current User    Types: Snuff  . Tobacco comment: quit  2014  Substance Use Topics  . Alcohol use: Not Currently    Comment: Occasionally  . Drug use: No   Review of Systems Constitutional: Negative for fever. Cardiovascular: Negative for chest pain. Respiratory: Negative for shortness of breath. Gastrointestinal: Negative for abdominal pain, vomiting and diarrhea. Musculoskeletal: Negative for back pain. Skin: Negative for rash. Neurological: Negative for headaches, positive for weakness and lightheadedness  All systems negative/normal/unremarkable except as stated in the HPI  ____________________________________________   PHYSICAL EXAM:  VITAL SIGNS: ED Triage Vitals [06/19/19 0822]  Enc Vitals Group     BP 94/67     Pulse Rate 76     Resp 16     Temp 98.7 F (37.1 C)     Temp Source Oral     SpO2 97 %     Weight 129 lb (58.5 kg)     Height 5\' 6"  (1.676 m)     Head Circumference      Peak Flow      Pain Score 0     Pain Loc      Pain Edu?      Excl.  in [place]?    Constitutional: Alert and oriented. Well appearing and in no distress. Eyes: Conjunctivae are normal. Normal extraocular movements. ENT      Head: Normocephalic and atraumatic.      Nose: No congestion/rhinnorhea.      Mouth/Throat: Mucous membranes are moist.      Neck: No stridor. Cardiovascular: Normal rate, regular rhythm. No murmurs, rubs, or gallops. Respiratory: Normal respiratory effort without tachypnea nor retractions. Breath sounds are clear and equal bilaterally. No wheezes/rales/rhonchi. Gastrointestinal: Soft and nontender. Normal bowel sounds.  Ileostomy site looks unremarkable, stool with a normal appearance. Musculoskeletal: Nontender with normal range of motion in extremities. No lower extremity tenderness nor edema. Neurologic:  Normal speech and language. No gross focal neurologic deficits are appreciated.  Skin:  Skin is warm, dry and intact. No rash noted. Psychiatric: Mood and affect are normal. Speech and behavior are normal.   ____________________________________________  EKG: Interpreted by me.  Sinus rhythm the rate of 71 bpm, prolonged PR interval, normal QRS, normal QT, normal axis  ____________________________________________  ED COURSE:  As part of my medical decision making, I reviewed the following data within the [place] History obtained from family if available, nursing notes, old chart and ekg, as well as notes from prior ED visits. Patient presented for near syncope and hypotension, we will assess with labs as indicated at this time. Clinical Course as of Jun 18 1033  Fri Jun 19, 2019  1005 Severe orthostasis    [JW]    Clinical Course User Index [JW] Earleen Newport, MD   Procedures  Danielle [name] was evaluated in Emergency Department on 06/19/2019 for the symptoms described in the history of present illness. She was evaluated in the context of the global COVID-19 pandemic, which necessitated consideration that the patient might be at risk for infection with the SARS-CoV-2 virus that causes COVID-19. Institutional protocols and algorithms that pertain to the evaluation of patients at risk for COVID-19 are in a state of rapid change based on information released by regulatory bodies including the CDC and federal and state organizations. These policies and algorithms were followed during the patient's care in the ED.  ____________________________________________   LABS (pertinent positives/negatives)  Labs Reviewed  CBC WITH DIFFERENTIAL/PLATELET - Abnormal; Notable for the following components:      Result Value   RBC 1.84 (*)    Hemoglobin 6.0 (*)    HCT 19.5 (*)    MCV 106.0 (*)    nRBC 2.3 (*)    All other components within normal limits  COMPREHENSIVE METABOLIC PANEL - Abnormal; Notable for the following components:   Sodium 133 (*)    CO2 15 (*)    BUN 40 (*)    Creatinine, Ser 1.93 (*)    Total Bilirubin 0.2 (*)    GFR calc non Af Amer 27 (*)    GFR  calc Af Amer 31 (*)    All other components within normal limits  URINALYSIS, COMPLETE (UACMP) WITH MICROSCOPIC - Abnormal; Notable for the following components:   Color, Urine YELLOW (*)    APPearance HAZY (*)    Leukocytes,Ua TRACE (*)    All other components within normal limits  TYPE AND SCREEN  PREPARE RBC (CROSSMATCH)  TROPONIN I (HIGH SENSITIVITY)  ____________________________________________   CRITICAL CARE Performed by: Laurence Aly   Total critical care time: 30 minutes  Critical care time was exclusive of separately billable procedures and treating other patients.  Critical  care was necessary to treat or prevent imminent or life-threatening deterioration.  Critical care was time spent personally by me on the following activities: development of treatment plan with patient and/or surrogate as well as nursing, discussions with consultants, evaluation of patient's response to treatment, examination of patient, obtaining history from patient or surrogate, ordering and performing treatments and interventions, ordering and review of laboratory studies, ordering and review of radiographic studies, pulse oximetry and re-evaluation of patient's condition.   DIFFERENTIAL DIAGNOSIS   Dehydration, electrolyte abnormality, occult infection, medication side effect, renal failure  FINAL ASSESSMENT AND PLAN  Near syncope, hypotension, symptomatic anemia, chronic kidney disease   Plan: The patient had presented for near syncope and hypotension.  On arrival blood pressure was in the 17G systolic.  Patient's labs do indicate a worsening anemia with a hemoglobin of 6.0 which will require blood transfusion.  Patient reports intermittent black stool coming from her ileostomy although it is normal in appearance today.  This will need to be hemocculted at some point.  I have ordered a blood transfusion for her which may require giving her Lasix as well due to her heart failure. I will  discuss with the hospitalist for admission    Laurence Aly, MD    Note: This note was generated in part or whole with voice recognition software. Voice recognition is usually quite accurate but there are transcription errors that can and very often do occur. I apologize for any typographical errors that were not detected and corrected.     Earleen Newport, MD 06/19/19 0174    Earleen Newport, MD 06/19/19 1034

## 2019-06-19 NOTE — ED Notes (Signed)
NT to pick up next unit of blood.

## 2019-06-19 NOTE — ED Notes (Signed)
FIRST NURSE note: per pt daughter, pt was seen at PCP yesterday and was referred to ED for hypotension of 76/50  but was unable to bring her yesterday. PT states she is dizzy with ambulation

## 2019-06-19 NOTE — Consult Note (Signed)
Danielle Antigua, MD [address] [place], [name], [place], [address] [address] [place], [place], [place], [address] Phone: [phone]  Fax: [phone]  Consultation  Referring Provider:     Rufina Falco Primary Care Physician:  Center, [facility] Reason for Consultation:     Anemia  Date of Admission:  06/19/2019 Date of Consultation:  06/19/2019         HPI:   Danielle [name] is a 63 y.o. female with history of peripheral vascular disease on Eliquis, anemia chronic disease, squamous cell carcinoma of esophagus, non-small cell lung cancer in 2016 status post SBRT, history of colon ulceration and obstruction, requiring right hemicolectomy and ostomy,  presents with hypotension and near syncope.  Symptoms started 1 day ago, associated with shortness of breath.  Denies any active bleeding.  No hematemesis, blood in stool, melena.  Previously seen by Dr. Vicente Males, in July 2020 last and underwent upper endoscopy for dysphagia on April 24, 2019.  Abnormal mucosa seen in the lower third of the esophagus.  Biopsies resulted squamous cell carcinoma.  EUS revealed this to be a T1N0 tumor.  As per Dr. Aletha Halim note, she was awaiting evaluation at [facility] for ESD.  An ileoscopy was also scheduled due to anemia in October 2019.  Ileoscopy could not be done as the ileostomy site was not amenable to insertion of the scope.  Recommendations were to have surgery we look at this to see if it needs to be revised.  Evaluation of the transverse colon was done through the mucous fistula and did not show any evidence of bleeding.  Previous history as per Dr. Georgeann Oppenheim note:  "She was admitted on 07/18/2018 with small bowel obstruction. AKI. Dr. Peyton Najjar performed an ex lap and end ileostomy about 3 months back for bleeding of the hepatic flexure. Subsequently in 07/04/2018 she was admitted with small bowel obstruction. In July 2019 she had a CT scan of the abdomen which showed thickening in  the ascending colon. Colonoscopy showed large circumferential ulceration of the hepatic flexure. He subsequently had a right hemicolectomy. Culture report showed no evidence of malignancy and it appeared ischemic. She had an upper endoscopy on 05/07/2018 which was performed for hematochezia and the EGD showed superficial esophageal tear, esophagitis and was commenced on a PPI."      Past Medical History:  Diagnosis Date  . Anxiety   . Arthritis   . Breast cancer ([place]) 2000  . Cancer ([place]) left    breast cancer 2000, chemo tx's with total mastectomy and lymph nodes resected.   . Cancer of right lung ([place]) 02/21/2016   rad tx's.   . CHF (congestive heart failure) ([place])   . COPD (chronic obstructive pulmonary disease) ([place])   . Dependence on supplemental oxygen   . Depression   . Diabetes mellitus without complication ([place])   . Heart murmur   . Hypertension   . Lung nodule   . Lymphedema   . Personal history of chemotherapy   . Personal history of radiation therapy   . Shortness of breath dyspnea    with exertion  . Status post chemotherapy 2001   left breast cancer  . Status post radiation therapy 2001   left breast cancer    Past Surgical History:  Procedure Laterality Date  . Breast Biospy Left    [facility]  . BREAST SURGERY    . COLONOSCOPY N/A 04/30/2018   Procedure: COLONOSCOPY;  Surgeon: Virgel Manifold, MD;  Location: [facility] ENDOSCOPY;  Service: Endoscopy;  Laterality: N/A;  . COLONOSCOPY N/A 07/22/2018   Procedure: COLONOSCOPY;  Surgeon: Virgel Manifold, MD;  Location: [facility] ENDOSCOPY;  Service: Endoscopy;  Laterality: N/A;  . DILATION AND CURETTAGE OF UTERUS    . ELECTROMAGNETIC NAVIGATION BROCHOSCOPY Right 04/11/2016   Procedure: ELECTROMAGNETIC NAVIGATION BRONCHOSCOPY;  Surgeon: Vilinda Boehringer, MD;  Location: [facility] ORS;  Service: Cardiopulmonary;  Laterality: Right;  . ESOPHAGOGASTRODUODENOSCOPY N/A 07/22/2018   Procedure: ESOPHAGOGASTRODUODENOSCOPY (EGD);   Surgeon: Virgel Manifold, MD;  Location[facility] ENDOSCOPY;  Service: Endoscopy;  Laterality: N/A;  . ESOPHAGOGASTRODUODENOSCOPY (EGD) WITH PROPOFOL N/A 05/07/2018   Procedure: ESOPHAGOGASTRODUODENOSCOPY (EGD) WITH PROPOFOL;  Surgeon: Lucilla Lame, MD;  Location: [facility] ENDOSCOPY;  Service: Endoscopy;  Laterality: N/A;  . ESOPHAGOGASTRODUODENOSCOPY (EGD) WITH PROPOFOL N/A 04/24/2019   Procedure: ESOPHAGOGASTRODUODENOSCOPY (EGD) WITH PROPOFOL;  Surgeon: Jonathon Bellows, MD;  Location: [facility] ENDOSCOPY;  Service: Gastroenterology;  Laterality: N/A;  . EUS N/A 05/07/2019   Procedure: FULL UPPER ENDOSCOPIC ULTRASOUND (EUS) RADIAL;  Surgeon: Jola Schmidt, MD;  Location: [facility] ENDOSCOPY;  Service: Endoscopy;  Laterality: N/A;  . history of colonoscopy]    . ILEOSCOPY N/A 07/22/2018   Procedure: ILEOSCOPY THROUGH STOMA;  Surgeon: Virgel Manifold, MD;  Location: [facility] ENDOSCOPY;  Service: Endoscopy;  Laterality: N/A;  . ILEOSTOMY    . ILEOSTOMY N/A 09/08/2018   Procedure: ILEOSTOMY REVISION POSSIBLE CREATION;  Surgeon: Herbert Pun, MD;  Location: [facility] ORS;  Service: General;  Laterality: N/A;  . ILEOSTOMY CLOSURE N/A 08/15/2018   Procedure: DILATION OF ILEOSTOMY STRICTURE;  Surgeon: Herbert Pun, MD;  Location: [facility] ORS;  Service: General;  Laterality: N/A;  . LAPAROTOMY Right 05/04/2018   Procedure: EXPLORATORY LAPAROTOMY right colectomy right and left ostomy;  Surgeon: Herbert Pun, MD;  Location: [facility] ORS;  Service: General;  Laterality: Right;  . LUNG BIOPSY    . MASTECTOMY Left    2000, [facility]  . ROTATOR CUFF REPAIR Right    [facility]    Prior to Admission medications   Medication Sig Start Date End Date Taking? Authorizing Provider  acetaminophen (TYLENOL) 325 MG tablet Take 2 tablets (650 mg total) by mouth every 6 (six) hours as needed for mild pain (or Fever >/= 101). 12/24/18  Yes Gouru, Illene Silver, MD  albuterol (PROVENTIL HFA;VENTOLIN HFA) 108 (90 Base) MCG/ACT inhaler Inhale 2  puffs into the lungs every 6 (six) hours as needed for wheezing or shortness of breath. 04/24/18  Yes Demetrios Loll, MD  apixaban (ELIQUIS) 5 MG TABS tablet Take 1 tablet (5 mg total) by mouth 2 (two) times daily. 06/23/18  Yes [name], MD  budesonide-formoterol (SYMBICORT) 160-4.5 MCG/ACT inhaler Inhale 2 puffs into the lungs 2 (two) times daily.   Yes [provider]  carvedilol (COREG) 6.25 MG tablet Take 1 tablet (6.25 mg total) by mouth 2 (two) times daily. 06/24/18  Yes Hackney, Tina A, FNP  citalopram (CELEXA) 40 MG tablet Take 40 mg by mouth daily.   Yes [provider]  ferrous sulfate 325 (65 FE) MG tablet Take 1 tablet (325 mg total) by mouth 2 (two) times daily with a meal. 07/02/18  Yes [name], MD  Fluticasone-Umeclidin-Vilant 100-62.5-25 MCG/INH AEPB Inhale 1 puff into the lungs daily.  01/07/19  Yes [provider]  furosemide (LASIX) 20 MG tablet Take 1 tablet (20 mg total) by mouth daily. And additional 20mg  dose as needed Patient taking differently: Take 20-40 mg by mouth See admin instructions. Take 1 tablet (20mg ) by mouth  daily - may take a second tablet (20mg ) by mouth daily as needed for severe swelling 06/15/19  Yes Hackney, Tina A, FNP  guaiFENesin-dextromethorphan (ROBITUSSIN DM) 100-10 MG/5ML syrup Take 5 mLs by mouth every 4 (four) hours as needed for cough.   Yes [provider]  Multiple Vitamin (MULTIVITAMIN WITH MINERALS) TABS tablet Take 1 tablet by mouth daily. 06/06/18  Yes Fritzi Mandes, MD  pantoprazole (PROTONIX) 40 MG tablet Take 1 tablet (40 mg total) by mouth 2 (two) times daily before a meal. 07/23/18  Yes Sudini, Srikar, MD  potassium chloride (K-DUR) 10 MEQ tablet Take 10 mEq by mouth daily.    Yes [provider]  loperamide (IMODIUM A-D) 2 MG tablet Take 1 tablet (2 mg total) by mouth as needed for diarrhea or loose stools. Patient not taking: Reported on 06/12/2019 10/04/18   Herbert Pun, MD  sucralfate (CARAFATE) 1 GM/10ML suspension Take 10 mLs (1 g total) by mouth 4 (four) times daily. Patient not taking: Reported on 06/19/2019 01/30/19   Jonathon Bellows, MD    Family History  Problem Relation Age of Onset  . Breast cancer Mother 69  . Cancer Mother        Breast   . Cirrhosis Father   . Breast cancer Paternal Aunt 66  . Cancer Maternal Aunt        Breast      Social History   Tobacco Use  . Smoking status: Former Smoker    Packs/day: 0.50    Years: 20.00    Pack years: 10.00    Types: Cigarettes    Quit date: 07/02/2012    Years since quitting: 6.9  . Smokeless tobacco: Current User    Types: Snuff  . Tobacco comment: quit 2014  Substance Use Topics  . Alcohol use: Not Currently    Comment: Occasionally  . Drug use: No    Allergies as of 06/19/2019  . (No Known Allergies)    Review of Systems:    All systems reviewed and negative except where noted in HPI.   Physical Exam:  Vital signs in last 24 hours: Vitals:   06/19/19 1420 06/19/19 1512 06/19/19 1534 06/19/19 1701  BP: (!) 87/52 (!) 99/57 (!) 95/57 96/63  Pulse: 73 70 68 70  Resp: (!) 21 18 17 19   Temp: 98.3 F (36.8 C) 98.8 F (37.1 C) 98.3 F (36.8 C) 98.2 F (36.8 C)  TempSrc: Oral Oral Oral Oral  SpO2: 98%  97% 100%  Weight:      Height:         General:   Pleasant, cooperative in NAD Head:  Normocephalic and atraumatic. Eyes:   No icterus.   Conjunctiva pink. PERRLA. Ears:  Normal auditory acuity. Neck:  Supple; no masses or thyroidomegaly Lungs: Respirations even and unlabored. Lungs clear to auscultation bilaterally.   No wheezes, crackles, or rhonchi.  Abdomen:  Soft, nondistended, nontender. Normal bowel sounds. No appreciable masses or hepatomegaly.  No rebound or guarding.  Ileostomy examined and shows normal pink mucosa with brown stool around the ostomy and in the bag. Mucus fistula with pink healthy mucosa and no bleeding.  Neurologic:  Alert and oriented x3;   grossly normal neurologically. Skin:  Intact without significant lesions or rashes. Cervical Nodes:  No significant cervical adenopathy. Psych:  Alert and cooperative. Normal affect.  LAB RESULTS: Recent Labs    06/19/19 0856  WBC 8.6  HGB 6.0*  HCT 19.5*  PLT 384  BMET Recent Labs    06/19/19 0856  NA 133*  K 3.8  CL 110  CO2 15*  GLUCOSE 97  BUN 40*  CREATININE 1.93*  CALCIUM 9.3   LFT Recent Labs    06/19/19 0856  PROT 7.0  ALBUMIN 3.7  AST 23  ALT 14  ALKPHOS 65  BILITOT 0.2*   PT/INR No results for input(s): LABPROT, INR in the last 72 hours.  STUDIES: Dg Chest 1 View  Result Date: 06/19/2019 CLINICAL DATA:  Shortness of breath. Hypotension. EXAM: CHEST  1 VIEW COMPARISON:  Chest x-ray dated 12/22/2018 FINDINGS: The heart size and pulmonary vascularity are normal. Scarring in the right midzone, unchanged. No infiltrates or effusions. Aortic atherosclerosis. No acute bone abnormality. IMPRESSION: No acute cardiopulmonary disease. Scarring in the right midzone. Aortic atherosclerosis. Electronically Signed   By: Lorriane Shire M.D.   On: 06/19/2019 12:13      Impression / Plan:   AZALYNN MAXIM is a 63 y.o. y/o female with esophageal SCC, awaiting ESD at [facility], Ileostomy and colon mucus fistula due to SBO and colon ulceration, admitted with dizziness, and found to be anemic  No evidence of active GI bleeding  Ostomy and mucus fistula site normal  Pt has had extensive GI workup recently and is awaiting ESD at [facility] for Esophageal SCC  With no signs of active GI bleeding and recent EGDs showing erythematous granular mucosa in the esophagusin July 2020, and previously esophagitis in Oct 2019, she may have some underlying esophagitis as the possible etiology of her anemia.   She has also had colonoscopy via the mucus fistula within the year  Given above workup, and normal ostomy exam today, normal iron and ferritin level, repeat procedures are unlikely to be  of additional therapeutic benefit and would entail procedural and sedation risks  Would recommend complete anemia workup. Pt has normal MCV and normal Iron level 1 week ago. Rule out B12 deficiency. Consider hematology consult.  PPI IV twice daily  Continue serial CBCs and transfuse PRN Avoid NSAIDs Maintain 2 large-bore IV lines Please page GI with any acute hemodynamic changes, or signs of active GI bleeding  If anemia does not improve or pt has active GI bleeding endoscopy can be considered at that time if needed  No indication for urgent endoscopy at this time  Thank you for involving me in the care of this patient.      LOS: 0 days   Virgel Manifold, MD  06/19/2019, 5:12 PM

## 2019-06-19 NOTE — ED Notes (Signed)
Provider Ouma notified of pt's low BP. Will hang 2nd unit of blood soon & continue to monitor. Pt does state dizziness.

## 2019-06-19 NOTE — H&P (Signed)
[facility] NAME: Danielle [name]    MR#:  [id number]  DATE OF BIRTH:  15-May-1956  DATE OF ADMISSION:  06/19/2019  PRIMARY CARE PHYSICIAN: [facility] [place]   REQUESTING/REFERRING PHYSICIAN: Lenise Arena, MD  CHIEF COMPLAINT:   Chief Complaint  Patient presents with   Hypotension   HISTORY OF PRESENT ILLNESS:   63 year old female with past medical history of systolic CHF, lung cancer s/p [id number](8280), breast cancer, COPD, diabetes mellitus, hypertension, PVD on Eliquis, anemia of chronic disease, squamous cell carcinoma of the esophagus, diverticulitis status post colon resection with right lower quadrant ostomy presenting to the ED with chief complaints of dizziness, hypotension and near syncope.  Patient report onset of symptoms since 06/08/2019 describing as feeling lightheadedness associated with shortness of breath, chest tightness, palpitation and dizziness. Patient states symptoms is worse with standing or walking and better with sitting down. Denies associated symptoms of fevers or chills, headache, nausea or vomiting, abdominal pain, diarrhea, or cough. Denies recent sick contacts. She was seen by PCP yesterday and was referred to the ED for hypotension (76/50), patient was unable to come to the ED due to transportation issues.  On arrival to the ED, she was afebrile but slightly hypothermic with a temp of 96.5, blood pressure 99/64 mm Hg and pulse rate 78 beats/min. There were no focal neurological deficits; she was alert and oriented x4.  Initial labs revealed hemoglobin 6.0 dropped from 8.4 about 7 days ago, hematocrit 19.5, sodium 133, BUN 40, creatinine 1.93.  Urinalysis showed trace leukocytes.  Patient will be admitted under hospitalist service for further management.  PAST MEDICAL HISTORY:   Past Medical History:  Diagnosis Date   Anxiety    Arthritis    Breast cancer ([place]) 2000   Cancer ([place])  left    breast cancer 2000, chemo tx's with total mastectomy and lymph nodes resected.    Cancer of right lung ([place]) 02/21/2016   rad tx's.    CHF (congestive heart failure) (HCC)    COPD (chronic obstructive pulmonary disease) (HCC)    Dependence on supplemental oxygen    Depression    Diabetes mellitus without complication (HCC)    Heart murmur    Hypertension    Lung nodule    Lymphedema    Personal history of chemotherapy    Personal history of radiation therapy    Shortness of breath dyspnea    with exertion   Status post chemotherapy 2001   left breast cancer   Status post radiation therapy 2001   left breast cancer   PAST SURGICAL HISTORY:   Past Surgical History:  Procedure Laterality Date   Breast Biospy Left    [facility]   BREAST SURGERY     COLONOSCOPY N/A 04/30/2018   Procedure: COLONOSCOPY;  Surgeon: Virgel Manifold, MD;  Location: [facility] ENDOSCOPY;  Service: Endoscopy;  Laterality: N/A;   COLONOSCOPY N/A 07/22/2018   Procedure: COLONOSCOPY;  Surgeon: Virgel Manifold, MD;  Location: [facility] ENDOSCOPY;  Service: Endoscopy;  Laterality: N/A;   DILATION AND CURETTAGE OF UTERUS     ELECTROMAGNETIC NAVIGATION BROCHOSCOPY Right 04/11/2016   Procedure: ELECTROMAGNETIC NAVIGATION BRONCHOSCOPY;  Surgeon: Vilinda Boehringer, MD;  Location: [facility] ORS;  Service: Cardiopulmonary;  Laterality: Right;   ESOPHAGOGASTRODUODENOSCOPY N/A 07/22/2018   Procedure: ESOPHAGOGASTRODUODENOSCOPY (EGD);  Surgeon: Virgel Manifold, MD;  Location: [facility] ENDOSCOPY;  Service: Endoscopy;  Laterality: N/A;   ESOPHAGOGASTRODUODENOSCOPY (EGD) WITH PROPOFOL N/A 05/07/2018  Procedure: ESOPHAGOGASTRODUODENOSCOPY (EGD) WITH PROPOFOL;  Surgeon: Lucilla Lame, MD;  Location: [facility] ENDOSCOPY;  Service: Endoscopy;  Laterality: N/A;   ESOPHAGOGASTRODUODENOSCOPY (EGD) WITH PROPOFOL N/A 04/24/2019   Procedure: ESOPHAGOGASTRODUODENOSCOPY (EGD) WITH PROPOFOL;  Surgeon: Jonathon Bellows, MD;  Location:  [facility] ENDOSCOPY;  Service: Gastroenterology;  Laterality: N/A;   EUS N/A 05/07/2019   Procedure: FULL UPPER ENDOSCOPIC ULTRASOUND (EUS) RADIAL;  Surgeon: Jola Schmidt, MD;  Location: [facility] ENDOSCOPY;  Service: Endoscopy;  Laterality: N/A;   history of colonoscopy]     ILEOSCOPY N/A 07/22/2018   Procedure: ILEOSCOPY THROUGH STOMA;  Surgeon: Virgel Manifold, MD;  Location: [facility] ENDOSCOPY;  Service: Endoscopy;  Laterality: N/A;   ILEOSTOMY     ILEOSTOMY N/A 09/08/2018   Procedure: ILEOSTOMY REVISION POSSIBLE CREATION;  Surgeon: Herbert Pun, MD;  Location: [facility] ORS;  Service: General;  Laterality: N/A;   ILEOSTOMY CLOSURE N/A 08/15/2018   Procedure: DILATION OF ILEOSTOMY STRICTURE;  Surgeon: Herbert Pun, MD;  Location: [facility] ORS;  Service: General;  Laterality: N/A;   LAPAROTOMY Right 05/04/2018   Procedure: EXPLORATORY LAPAROTOMY right colectomy right and left ostomy;  Surgeon: Herbert Pun, MD;  Location: [facility] ORS;  Service: General;  Laterality: Right;   LUNG BIOPSY     MASTECTOMY Left    2000, [facility]   ROTATOR CUFF REPAIR Right    [facility]    SOCIAL HISTORY:   Social History   Tobacco Use   Smoking status: Former Smoker    Packs/day: 0.50    Years: 20.00    Pack years: 10.00    Types: Cigarettes    Quit date: 07/02/2012    Years since quitting: 6.9   Smokeless tobacco: Current User    Types: Snuff   Tobacco comment: quit 2014  Substance Use Topics   Alcohol use: Not Currently    Comment: Occasionally    FAMILY HISTORY:   Family History  Problem Relation Age of Onset   Breast cancer Mother 83   Cancer Mother        Breast    Cirrhosis Father    Breast cancer Paternal Aunt 8   Cancer Maternal Aunt        Breast     DRUG ALLERGIES:  No Known Allergies  REVIEW OF SYSTEMS:   Review of Systems  Constitutional: Negative for chills, fever, malaise/fatigue and weight loss.  HENT: Negative for congestion, hearing loss and sore  throat.   Eyes: Negative for blurred vision and double vision.  Respiratory: Positive for shortness of breath. Negative for cough and wheezing.   Cardiovascular: Positive for palpitations. Negative for chest pain, orthopnea and leg swelling.  Gastrointestinal: Negative for abdominal pain, diarrhea, nausea and vomiting.  Genitourinary: Negative for dysuria and urgency.  Musculoskeletal: Negative for myalgias.  Skin: Negative for rash.  Neurological: Positive for dizziness. Negative for sensory change, speech change, focal weakness and headaches.  Psychiatric/Behavioral: Negative for depression.   MEDICATIONS AT HOME:   Prior to Admission medications   Medication Sig Start Date End Date Taking? Authorizing Provider  acetaminophen (TYLENOL) 325 MG tablet Take 2 tablets (650 mg total) by mouth every 6 (six) hours as needed for mild pain (or Fever >/= 101). 12/24/18  Yes Gouru, Illene Silver, MD  albuterol (PROVENTIL HFA;VENTOLIN HFA) 108 (90 Base) MCG/ACT inhaler Inhale 2 puffs into the lungs every 6 (six) hours as needed for wheezing or shortness of breath. 04/24/18  Yes Demetrios Loll, MD  apixaban (ELIQUIS) 5 MG TABS tablet Take 1 tablet (5 mg total) by  mouth 2 (two) times daily. 06/23/18  Yes [name], MD  budesonide-formoterol (SYMBICORT) 160-4.5 MCG/ACT inhaler Inhale 2 puffs into the lungs 2 (two) times daily.   Yes [provider]  carvedilol (COREG) 6.25 MG tablet Take 1 tablet (6.25 mg total) by mouth 2 (two) times daily. 06/24/18  Yes Hackney, Tina A, FNP  citalopram (CELEXA) 40 MG tablet Take 40 mg by mouth daily.   Yes [provider]  ferrous sulfate 325 (65 FE) MG tablet Take 1 tablet (325 mg total) by mouth 2 (two) times daily with a meal. 07/02/18  Yes [name], MD  Fluticasone-Umeclidin-Vilant 100-62.5-25 MCG/INH AEPB Inhale 1 puff into the lungs daily.  01/07/19  Yes [provider]  furosemide (LASIX) 20 MG tablet Take 1 tablet (20 mg total)  by mouth daily. And additional 20mg  dose as needed Patient taking differently: Take 20-40 mg by mouth See admin instructions. Take 1 tablet (20mg ) by mouth daily - may take a second tablet (20mg ) by mouth daily as needed for severe swelling 06/15/19  Yes Hackney, Tina A, FNP  guaiFENesin-dextromethorphan (ROBITUSSIN DM) 100-10 MG/5ML syrup Take 5 mLs by mouth every 4 (four) hours as needed for cough.   Yes [provider]  Multiple Vitamin (MULTIVITAMIN WITH MINERALS) TABS tablet Take 1 tablet by mouth daily. 06/06/18  Yes Fritzi Mandes, MD  pantoprazole (PROTONIX) 40 MG tablet Take 1 tablet (40 mg total) by mouth 2 (two) times daily before a meal. 07/23/18  Yes Sudini, Srikar, MD  potassium chloride (K-DUR) 10 MEQ tablet Take 10 mEq by mouth daily.    Yes [provider]  loperamide (IMODIUM A-D) 2 MG tablet Take 1 tablet (2 mg total) by mouth as needed for diarrhea or loose stools. Patient not taking: Reported on 06/12/2019 10/04/18   Herbert Pun, MD  sucralfate (CARAFATE) 1 GM/10ML suspension Take 10 mLs (1 g total) by mouth 4 (four) times daily. Patient not taking: Reported on 06/19/2019 01/30/19   Jonathon Bellows, MD      VITAL SIGNS:  Blood pressure 106/69, pulse 68, temperature 98.1 F (36.7 C), temperature source Oral, resp. rate 17, height 5\' 6"  (1.676 m), weight 58.5 kg, SpO2 100 %.  PHYSICAL EXAMINATION:   Physical Exam  GENERAL:  63 y.o.-year-old patient lying in the bed with no acute distress.  EYES: Pupils equal, round, reactive to light and accommodation. No scleral icterus. Extraocular muscles intact.  HEENT: Head atraumatic, normocephalic. Oropharynx and nasopharynx clear.  NECK:  Supple, no jugular venous distention. No thyroid enlargement, no tenderness.  LUNGS: Normal breath sounds bilaterally, no wheezing, rales,rhonchi or crepitation. No use of accessory muscles of respiration.  CARDIOVASCULAR: S1, S2 normal. No murmurs, rubs, or gallops.  ABDOMEN:  Soft, nontender, nondistended. Bowel sounds present. No organomegaly or mass. RLQ ostomy EXTREMITIES: No pedal edema, cyanosis, or clubbing. No rash or lesions. + pedal pulses MUSCULOSKELETAL: Normal bulk, and power was 5+ grip and elbow, knee, and ankle flexion and extension bilaterally.  NEUROLOGIC:Alert and oriented x 3. CN 2-12 intact. Sensation to light touch and cold stimuli intact bilaterally. Gait not tested due to safety concern. PSYCHIATRIC: The patient is alert and oriented x 3.  SKIN: No obvious rash, lesion, or ulcer.   DATA REVIEWED:  LABORATORY PANEL:   CBC Recent Labs  Lab 06/19/19 0856  WBC 8.6  HGB 6.0*  HCT 19.5*  PLT 384   ------------------------------------------------------------------------------------------------------------------  Chemistries  Recent Labs  Lab 06/19/19 0856  NA 133*  K 3.8  CL 110  CO2 15*  GLUCOSE 97  BUN 40*  CREATININE 1.93*  CALCIUM 9.3  AST 23  ALT 14  ALKPHOS 65  BILITOT 0.2*   ------------------------------------------------------------------------------------------------------------------  Cardiac Enzymes No results for input(s): TROPONINI in the last 168 hours. ------------------------------------------------------------------------------------------------------------------  RADIOLOGY:  No results found.  EKG:  EKG: normal EKG, normal sinus rhythm, unchanged from previous tracings. Vent. rate 71 BPM PR interval * ms QRS duration 103 ms QT/QTc 415/451 ms P-R-T axes 65 64 51 IMPRESSION AND PLAN:   63 y.o. female  with past medical history of systolic CHF, lung cancer s/p ACZY(6063), breast cancer, COPD, diabetes mellitus, hypertension, PVD on Eliquis, anemia of chronic disease, squamous cell carcinoma of the esophagus, diverticulitis status post colon resection with right lower quadrant ostomy presenting to the ED with chief complaints of dizziness, hypotension and near syncope.  1. Symptomatic anemia -  Presenting with SOB,  Hypotension, dizziness, palpitation, near syncope and chest tightness. Found to have hgb of 6.0 -Hx of Diverticulosis s/p colon resection with RLQ ostomy - Admit to medsurg unit - IVF resuscitation to maintain MAP>65 - H&H monitoring q6h - Blood Consent. Transfuse PRN Hgb<7 - Pantoprazole 40mg   - NPO for pending endoscopy - Hold NSAIDs, steroids, ASA - GI Consult for EGD/Colonoscopy  2. Chronicsystolic Congestive Heart Failure: No evidence of exacerbation  Last Echo 04/2018 with  EF 30-35% - Hold Coreg for now - Hold Lasix - Echocardiogram pending - Low salt diet  - Check daily weight - Strict I&Os  3. COPD-  No signs of exacerbation - patient with hx of COPD uses home oxygen at 2L intermittently - Supplemental O2, goal sat 88-92% - Bronchodilators (albuterol/ipratropium) standing and PRN    4. Peripheral vascular disease - Holding Eliquis in the setting of suspected GI bleed   5. HTN-  Now hypotensive + Goal BP <130/80 - We will hold BP meds for now   6. DM - not insulin dependent at home - Recent hemoglobin A1c - SSI  7. Lung cancer s/p KZSW(1093) and squamous cell carcinoma of the esophagus - Following with oncology Dr. [name]   8. DVT prophylaxis - Hold anti-coagulation for pending procedure / active bleed     All the records are reviewed and case discussed with ED provider. Management plans discussed with the patient, family and they are in agreement.  CODE STATUS: FULL  TOTAL TIME TAKING CARE OF THIS PATIENT: 50 minutes.    on 06/19/2019 at 11:52 AM  Rufina Falco, DNP, FNP-BC [facility] Nurse Practitioner Between 7am to 6pm - Pager [phone]  After 6pm go to [email or web address] - password [email or web address] [facility]  Office  [phone]  CC: Primary care physician; [facility], [facility]

## 2019-06-20 ENCOUNTER — Encounter: Payer: Self-pay | Admitting: Internal Medicine

## 2019-06-20 DIAGNOSIS — D649 Anemia, unspecified: Secondary | ICD-10-CM | POA: Diagnosis present

## 2019-06-20 DIAGNOSIS — D62 Acute posthemorrhagic anemia: Secondary | ICD-10-CM | POA: Diagnosis present

## 2019-06-20 LAB — BASIC METABOLIC PANEL
Anion gap: 10 (ref 5–15)
BUN: 28 mg/dL — ABNORMAL HIGH (ref 8–23)
CO2: 14 mmol/L — ABNORMAL LOW (ref 22–32)
Calcium: 9.1 mg/dL (ref 8.9–10.3)
Chloride: 114 mmol/L — ABNORMAL HIGH (ref 98–111)
Creatinine, Ser: 1.84 mg/dL — ABNORMAL HIGH (ref 0.44–1.00)
GFR calc Af Amer: 33 mL/min — ABNORMAL LOW (ref >60)
GFR calc non Af Amer: 28 mL/min — ABNORMAL LOW (ref >60)
Glucose, Bld: 88 mg/dL (ref 70–99)
Potassium: 3.7 mmol/L (ref 3.5–5.1)
Sodium: 138 mmol/L (ref 135–145)

## 2019-06-20 LAB — TYPE AND SCREEN
ABO/RH(D): O POS
Antibody Screen: NEGATIVE
Unit division: 0
Unit division: 0

## 2019-06-20 LAB — CBC
HCT: 27.3 % — ABNORMAL LOW (ref 36.0–46.0)
Hemoglobin: 8.8 g/dL — ABNORMAL LOW (ref 12.0–15.0)
MCH: 30.3 pg (ref 26.0–34.0)
MCHC: 32.2 g/dL (ref 30.0–36.0)
MCV: 94.1 fL (ref 80.0–100.0)
Platelets: 340 10*3/uL (ref 150–400)
RBC: 2.9 MIL/uL — ABNORMAL LOW (ref 3.87–5.11)
RDW: 17.3 % — ABNORMAL HIGH (ref 11.5–15.5)
WBC: 8.8 10*3/uL (ref 4.0–10.5)
nRBC: 1.5 % — ABNORMAL HIGH (ref 0.0–0.2)

## 2019-06-20 LAB — BPAM RBC
Blood Product Expiration Date: 202010102359
Blood Product Expiration Date: 202010102359
ISSUE DATE / TIME: 202009041127
ISSUE DATE / TIME: 202009041452
Unit Type and Rh: 5100
Unit Type and Rh: 5100

## 2019-06-20 LAB — SARS CORONAVIRUS 2 (TAT 6-24 HRS): SARS Coronavirus 2: NEGATIVE

## 2019-06-20 MED ORDER — SUCRALFATE 1 GM/10ML PO SUSP: 1.0000 g | Freq: Four times a day (QID) | ORAL | 0 refills | Status: DC

## 2019-06-20 MED ORDER — SODIUM CHLORIDE 0.9 % IV SOLN
400.0000 mg | Freq: Once | INTRAVENOUS | Status: AC
  Administered 2019-06-20: 400 mg via INTRAVENOUS
  Filled 2019-06-20: qty 20

## 2019-06-20 NOTE — Discharge Instructions (Signed)
Anemia  Anemia is a condition in which you do not have enough red blood cells or hemoglobin. Hemoglobin is a substance in red blood cells that carries oxygen. When you do not have enough red blood cells or hemoglobin (are anemic), your body cannot get enough oxygen and your organs may not work properly. As a result, you may feel very tired or have other problems. What are the causes? Common causes of anemia include:  Excessive bleeding. Anemia can be caused by excessive bleeding inside or outside the body, including bleeding from the intestine or from periods in women.  Poor nutrition.  Long-lasting (chronic) kidney, thyroid, and liver disease.  Bone marrow disorders.  Cancer and treatments for cancer.  HIV (human immunodeficiency virus) and AIDS (acquired immunodeficiency syndrome).  Treatments for HIV and AIDS.  Spleen problems.  Blood disorders.  Infections, medicines, and autoimmune disorders that destroy red blood cells. What are the signs or symptoms? Symptoms of this condition include:  Minor weakness.  Dizziness.  Headache.  Feeling heartbeats that are irregular or faster than normal (palpitations).  Shortness of breath, especially with exercise.  Paleness.  Cold sensitivity.  Indigestion.  Nausea.  Difficulty sleeping.  Difficulty concentrating. Symptoms may occur suddenly or develop slowly. If your anemia is mild, you may not have symptoms. How is this diagnosed? This condition is diagnosed based on:  Blood tests.  Your medical history.  A physical exam.  Bone marrow biopsy. Your health care provider may also check your stool (feces) for blood and may do additional testing to look for the cause of your bleeding. You may also have other tests, including:  Imaging tests, such as a CT scan or MRI.  Endoscopy.  Colonoscopy. How is this treated? Treatment for this condition depends on the cause. If you continue to lose a lot of blood, you may  need to be treated at a hospital. Treatment may include:  Taking supplements of iron, vitamin M08, or folic acid.  Taking a hormone medicine (erythropoietin) that can help to stimulate red blood cell growth.  Having a blood transfusion. This may be needed if you lose a lot of blood.  Making changes to your diet.  Having surgery to remove your spleen. Follow these instructions at home:  Take over-the-counter and prescription medicines only as told by your health care provider.  Take supplements only as told by your health care provider.  Follow any diet instructions that you were given.  Keep all follow-up visits as told by your health care provider. This is important. Contact a health care provider if:  You develop new bleeding anywhere in the body. Get help right away if:  You are very weak.  You are short of breath.  You have pain in your abdomen or chest.  You are dizzy or feel faint.  You have trouble concentrating.  You have bloody or black, tarry stools.  You vomit repeatedly or you vomit up blood. Summary  Anemia is a condition in which you do not have enough red blood cells or enough of a substance in your red blood cells that carries oxygen (hemoglobin).  Symptoms may occur suddenly or develop slowly.  If your anemia is mild, you may not have symptoms.  This condition is diagnosed with blood tests as well as a medical history and physical exam. Other tests may be needed.  Treatment for this condition depends on the cause of the anemia. This information is not intended to replace advice given to you by  your health care provider. Make sure you discuss any questions you have with your health care provider. °Document Released: 11/08/2004 Document Revised: 09/13/2017 Document Reviewed: 11/02/2016 °Elsevier Patient Education © 2020 [employer] ° °

## 2019-06-20 NOTE — Discharge Summary (Signed)
[place] [facility] NAME: Danielle [name]    MR#:  [id number]  DATE OF BIRTH:  01-May-1956  DATE OF ADMISSION:  06/19/2019 ADMITTING PHYSICIAN: Lang Snow, NP  DATE OF DISCHARGE: 06/20/2019  PRIMARY CARE PHYSICIAN: [facility]    ADMISSION DIAGNOSIS:  Orthostatic hypotension [I95.1] SOB (shortness of breath) [R06.02] Anemia, unspecified type [D64.9]  DISCHARGE DIAGNOSIS:  Active Problems:   GI bleeding   Acute blood loss anemia   SECONDARY DIAGNOSIS:   Past Medical History:  Diagnosis Date  . Anxiety   . Arthritis   . Breast cancer ([place]) 2000  . Cancer ([place]) left    breast cancer 2000, chemo tx's with total mastectomy and lymph nodes resected.   . Cancer of right lung ([place]) 02/21/2016   rad tx's.   . CHF (congestive heart failure) ([place])   . COPD (chronic obstructive pulmonary disease) ([place])   . Dependence on supplemental oxygen   . Depression   . Diabetes mellitus without complication ([place])   . Heart murmur   . Hypertension   . Lung nodule   . Lymphedema   . Personal history of chemotherapy   . Personal history of radiation therapy   . Shortness of breath dyspnea    with exertion  . Status post chemotherapy 2001   left breast cancer  . Status post radiation therapy 2001   left breast cancer    HOSPITAL COURSE:   1.  Acute on chronic blood loss anemia.  Patient was given a couple units of blood transfusion and responded. Hemoglobin came up to 8.8.  The patient was feeling better and wanted to go home.  In the ostomy was greenish-brown stool.  The patient stated a few days ago she did have some black stools.  Patient was seen by GI and they did not want to repeat any procedures.  With history of esophageal SCC, I will discontinue Eliquis.  Follow-up with hematology as outpatient for iron infusions.  I did give 400 mg of IV Venofer while here in the hospital. 2.  Chronic systolic congestive heart  failure.  Can restart Lasix as outpatient.  Continue to hold Coreg.  Can restart lower dose as outpatient 3.  COPD.  Patient uses oxygen intermittently at home.  No signs of exacerbation 4.  Peripheral vascular disease.  Hold Eliquis at this time 5.  History of lung cancer and squamous cell carcinoma of the esophagus.  Follow-up with Dr. Rogue Bussing as outpatient for iron infusions and close follow-up of hemoglobin. 6.  Chronic kidney disease stage III. 7.  Last hemoglobin A1c 4.3 back at the end of 2019.  Patient is not a diabetic.  DISCHARGE CONDITIONS:   [place]  CONSULTS OBTAINED:  Treatment Team:  Virgel Manifold, MD  DRUG ALLERGIES:  No Known Allergies  DISCHARGE MEDICATIONS:   Allergies as of 06/20/2019   No Known Allergies     Medication List    STOP taking these medications   apixaban 5 MG Tabs tablet Commonly known as: ELIQUIS   budesonide-formoterol 160-4.5 MCG/ACT inhaler Commonly known as: SYMBICORT   carvedilol 6.25 MG tablet Commonly known as: COREG     TAKE these medications   acetaminophen 325 MG tablet Commonly known as: TYLENOL Take 2 tablets (650 mg total) by mouth every 6 (six) hours as needed for mild pain (or Fever >/= 101).   albuterol 108 (90 Base) MCG/ACT inhaler Commonly known as: VENTOLIN HFA Inhale 2  puffs into the lungs every 6 (six) hours as needed for wheezing or shortness of breath.   citalopram 40 MG tablet Commonly known as: CELEXA Take 40 mg by mouth daily.   ferrous sulfate 325 (65 FE) MG tablet Take 1 tablet (325 mg total) by mouth 2 (two) times daily with a meal.   Fluticasone-Umeclidin-Vilant 100-62.5-25 MCG/INH Aepb Inhale 1 puff into the lungs daily.   furosemide 20 MG tablet Commonly known as: LASIX Take 1 tablet (20 mg total) by mouth daily. And additional 20mg  dose as needed What changed:   how much to take  when to take this  additional instructions   guaiFENesin-dextromethorphan 100-10 MG/5ML  syrup Commonly known as: ROBITUSSIN DM Take 5 mLs by mouth every 4 (four) hours as needed for cough.   loperamide 2 MG tablet Commonly known as: Imodium A-D Take 1 tablet (2 mg total) by mouth as needed for diarrhea or loose stools.   multivitamin with minerals Tabs tablet Take 1 tablet by mouth daily.   pantoprazole 40 MG tablet Commonly known as: Protonix Take 1 tablet (40 mg total) by mouth 2 (two) times daily before a meal.   potassium chloride 10 MEQ tablet Commonly known as: K-DUR Take 10 mEq by mouth daily.   sucralfate 1 GM/10ML suspension Commonly known as: CARAFATE Take 10 mLs (1 g total) by mouth 4 (four) times daily.        DISCHARGE INSTRUCTIONS:   Follow-up PMD 5 days Follow-up Dr. Rogue Bussing oncology 1 week  If you experience worsening of your admission symptoms, develop shortness of breath, life threatening emergency, suicidal or homicidal thoughts you must seek medical attention immediately by calling 911 or calling your MD immediately  if symptoms less severe.  You Must read complete instructions/literature along with all the possible adverse reactions/side effects for all the Medicines you take and that have been prescribed to you. Take any new Medicines after you have completely understood and accept all the possible adverse reactions/side effects.   Please note  You were cared for by a hospitalist during your hospital stay. If you have any questions about your discharge medications or the care you received while you were in the hospital after you are discharged, you can call the unit and asked to speak with the hospitalist on call if the hospitalist that took care of you is not available. Once you are discharged, your primary care physician will handle any further medical issues. Please note that NO REFILLS for any discharge medications will be authorized once you are discharged, as it is imperative that you return to your primary care physician (or  establish a relationship with a primary care physician if you do not have one) for your aftercare needs so that they can reassess your need for medications and monitor your lab values.    Today   CHIEF COMPLAINT:   Chief Complaint  Patient presents with  . Hypotension    HISTORY OF PRESENT ILLNESS:  Danielle [name]  is a 63 y.o. female admitted with black stools and hypotension   VITAL SIGNS:  Blood pressure 112/74, pulse 88, temperature 98.8 F (37.1 C), resp. rate 16, height 5\' 6"  (1.676 m), weight 58.5 kg, SpO2 100 %.    PHYSICAL EXAMINATION:  GENERAL:  63 y.o.-year-old patient lying in the bed with no acute distress.  EYES: Pupils equal, round, reactive to light and accommodation. No scleral icterus. Extraocular muscles intact.  HEENT: Head atraumatic, normocephalic. Oropharynx and nasopharynx clear.  NECK:  Supple, no jugular venous distention. No thyroid enlargement, no tenderness.  LUNGS: Normal breath sounds bilaterally, no wheezing, rales,rhonchi or crepitation. No use of accessory muscles of respiration.  CARDIOVASCULAR: S1, S2 normal. No murmurs, rubs, or gallops.  ABDOMEN: Soft, non-tender, non-distended. Bowel sounds present. No organomegaly or mass.  Greenish-brownish stool seen in ostomy EXTREMITIES: No pedal edema, cyanosis, or clubbing.  NEUROLOGIC: Cranial nerves II through XII are intact. Muscle strength 5/5 in all extremities. Sensation intact. Gait not checked.  PSYCHIATRIC: The patient is alert and oriented x 3.  SKIN: No obvious rash, lesion, or ulcer.   DATA REVIEW:   CBC Recent Labs  Lab 06/20/19 0650  WBC 8.8  HGB 8.8*  HCT 27.3*  PLT 340    Chemistries  Recent Labs  Lab 06/19/19 0856 06/20/19 0650  NA 133* 138  K 3.8 3.7  CL 110 114*  CO2 15* 14*  GLUCOSE 97 88  BUN 40* 28*  CREATININE 1.93* 1.84*  CALCIUM 9.3 9.1  AST 23  --   ALT 14  --   ALKPHOS 65  --   BILITOT 0.2*  --      Microbiology Results  Results for orders  placed or performed during the hospital encounter of 06/19/19  SARS CORONAVIRUS 2 (TAT 6-24 HRS) Nasopharyngeal Nasopharyngeal Swab     Status: None   Collection Time: 06/19/19 12:07 PM   Specimen: Nasopharyngeal Swab  Result Value Ref Range Status   SARS Coronavirus 2 NEGATIVE NEGATIVE Final    Comment: (NOTE) SARS-CoV-2 target nucleic acids are NOT DETECTED. The SARS-CoV-2 RNA is generally detectable in upper and lower respiratory specimens during the acute phase of infection. Negative results do not preclude SARS-CoV-2 infection, do not rule out co-infections with other pathogens, and should not be used as the sole basis for treatment or other patient management decisions. Negative results must be combined with clinical observations, patient history, and epidemiological information. The expected result is Negative. Fact Sheet for Patients: SugarRoll.be Fact Sheet for Healthcare Providers: https://www.woods-mathews.com/ This test is not yet approved or cleared by the [place] FDA and  has been authorized for detection and/or diagnosis of SARS-CoV-2 by FDA under an Emergency Use Authorization (EUA). This EUA will remain  in effect (meaning this test can be used) for the duration of the COVID-19 declaration under Section 56 4(b)(1) of the Act, 21 U.S.C. section 360bbb-3(b)(1), unless the authorization is terminated or revoked sooner. Performed at [facility], [place] [address] [place], [place] [address]     RADIOLOGY:  Dg Chest 1 View  Result Date: 06/19/2019 CLINICAL DATA:  Shortness of breath. Hypotension. EXAM: CHEST  1 VIEW COMPARISON:  Chest x-ray dated 12/22/2018 FINDINGS: The heart size and pulmonary vascularity are normal. Scarring in the right midzone, unchanged. No infiltrates or effusions. Aortic atherosclerosis. No acute bone abnormality. IMPRESSION: No acute cardiopulmonary disease. Scarring in the right midzone. Aortic  atherosclerosis. Electronically Signed   By: Lorriane Shire M.D.   On: 06/19/2019 12:13    Management plans discussed with the patient and she stated that she wants to go home.  I asked if she wanted me to speak with her family but she refused.  CODE STATUS:     Code Status Orders  (From admission, onward)         Start     Ordered   06/19/19 1123  Full code  Continuous     06/19/19 1124        Code Status  History    Date Active Date Inactive Code Status Order ID Comments User Context   12/22/2018 0844 12/24/2018 1727 Full Code [id number]  Harrie Foreman, MD Inpatient   09/28/2018 0813 10/04/2018 1522 Full Code [id number]  Herbert Pun, MD Inpatient   09/05/2018 0616 09/10/2018 1708 Full Code [id number]  Arta Silence, MD ED   08/12/2018 0540 08/16/2018 2057 Full Code [id number]  Arta Silence, MD ED   07/21/2018 0332 07/23/2018 1718 Full Code [id number]  Derrek Monaco, RN Inpatient   07/18/2018 0640 07/21/2018 0330 DNR [id number]  Harrie Foreman, MD Inpatient   07/05/2018 0808 07/09/2018 1946 DNR [id number]  Harrie Foreman, MD Inpatient   06/30/2018 1733 07/02/2018 1507 DNR [id number]  [name], MD Inpatient   06/30/2018 0019 06/30/2018 1733 Full Code [id number]  Amelia Jo, MD Inpatient   06/04/2018 0113 06/05/2018 1448 Full Code [id number]  Amelia Jo, MD Inpatient   04/21/2018 0633 05/17/2018 2214 Full Code [id number]  Arta Silence, MD Inpatient   02/09/2017 1125 02/11/2017 1706 Full Code [id number]  Henreitta Leber, MD Inpatient   Advance Care Planning Activity    Advance Directive Documentation     Most Recent Value  Type of Advance Directive  Healthcare Power of Attorney, Living will  Pre-existing out of facility DNR order (yellow form or pink MOST form)  -  "MOST" Form in Place?  -      TOTAL TIME TAKING CARE OF THIS PATIENT: 35 minutes.    Loletha Grayer M.D on 06/20/2019 at 12:40 PM  Between 7am to 6pm - Pager -  [phone]  After 6pm go to [email or web address] - Proofreader  [employer] [facility]  [phone]  CC: Primary care physician; [facility]

## 2019-06-21 ENCOUNTER — Encounter: Payer: Self-pay | Admitting: Gastroenterology

## 2019-06-21 NOTE — Progress Notes (Signed)
Addendum to previous GI consult note. Pt takes Eliquis and last dose was the morning of the consult. Pt was also on solid diet and eating solid food at the time of my evaluation. Given the above, elective procedures in the absence of active GI bleeding could not be done the same day. Anemia workup was recommended for macrocytic anemia. Pt was discharged by hospitalist team prior to discussion with GI team. Pt will be contacted to arrang e clinic follow up.

## 2019-06-23 ENCOUNTER — Telehealth: Payer: Self-pay | Admitting: Gastroenterology

## 2019-06-23 ENCOUNTER — Telehealth: Payer: Self-pay | Admitting: Internal Medicine

## 2019-06-23 NOTE — Telephone Encounter (Signed)
I left a message for pt's daughter, [name] to call us back to discuss re: pt's worsening anemia.  I reviewed the recent admission to hospital for hb-6.  DDx: includes- severe anemia sec to CKD vs primary bone marrow process. No obvious iron def noted.post transfusion Hb- 8.8.   Recommend proceeding with EMR of esophagus ca as planned at [facility] on 9/15. Continue to hold eliquis for now/ pt on eliquis for PVD per cardiology.  Post procedure pt will need further work up of her anemia- including myeloma work up/ and possiblly bone marrow Biopsy.    [name]-FYI.

## 2019-06-23 NOTE — Telephone Encounter (Signed)
Dr Rogue Bussing - I have not seen her recently. I presume if she is going for ESD procedure soon the eloquis was planned to be held?

## 2019-06-23 NOTE — Telephone Encounter (Signed)
Pt was seen at [facility] and the Doctor took her off the  rx Elequis they told her not to start it until she speaks to her PCP and Dr. Vicente Males.please call pt

## 2019-06-24 ENCOUNTER — Telehealth: Payer: Self-pay | Admitting: Internal Medicine

## 2019-06-24 ENCOUNTER — Ambulatory Visit
Admission: RE | Admit: 2019-06-24 | Discharge: 2019-06-24 | Disposition: A | Discharge status: Home / Self Care | Payer: Medicare Other | Source: Ambulatory Visit | Attending: Family Medicine | Admitting: Family Medicine

## 2019-06-24 DIAGNOSIS — M8580 Other specified disorders of bone density and structure, unspecified site: Secondary | ICD-10-CM | POA: Insufficient documentation

## 2019-06-24 DIAGNOSIS — Z853 Personal history of malignant neoplasm of breast: Secondary | ICD-10-CM | POA: Insufficient documentation

## 2019-06-24 DIAGNOSIS — Z13820 Encounter for screening for osteoporosis: Secondary | ICD-10-CM

## 2019-06-24 DIAGNOSIS — J449 Chronic obstructive pulmonary disease, unspecified: Secondary | ICD-10-CM | POA: Diagnosis not present

## 2019-06-24 DIAGNOSIS — D631 Anemia in chronic kidney disease: Secondary | ICD-10-CM

## 2019-06-24 DIAGNOSIS — Z780 Asymptomatic menopausal state: Secondary | ICD-10-CM | POA: Insufficient documentation

## 2019-06-24 LAB — HIV ANTIBODY (ROUTINE TESTING W REFLEX): HIV Screen 4th Generation wRfx: NONREACTIVE

## 2019-06-24 NOTE — Telephone Encounter (Signed)
Spoke to patient's daughter regarding patient's ongoing anemia.  Most recent hemoglobin status post PRBC transfusion 8.9.  No obvious bleeding.  However reasonable to hold off Eliquis while waiting for upcoming surgery at [facility] 9/14.    Patient will likely need a bone marrow biopsy for further evaluation as no clinically noted.  Will discuss at next visit on 9/16.

## 2019-06-29 ENCOUNTER — Telehealth: Payer: Self-pay | Admitting: Internal Medicine

## 2019-06-29 NOTE — Telephone Encounter (Signed)
Discussed with Dr. Oletta Cohn has tumor length of approximately 7 cm; circumferential [up to half]-on dye study/EGD-more than a suspected earlier.  Endo mucosal resection is less likely to be clinically effective.  Hence surgery not done.  Patient will follow-up with me this week-to discuss treatment options. Will speak to pt's daughter.   GB

## 2019-06-29 NOTE — Telephone Encounter (Signed)
I spoke to patient's daughter regarding the inability for Endomucosal resection at [facility].  We will recommend radiation; as alternative option.  Patient to keep appointment as planned on the 16th September/also to discuss bone marrow biopsy.

## 2019-06-30 ENCOUNTER — Encounter: Payer: Self-pay | Admitting: Internal Medicine

## 2019-06-30 ENCOUNTER — Other Ambulatory Visit: Payer: Self-pay

## 2019-06-30 NOTE — Progress Notes (Signed)
Patient pre screened for office appointment, no questions or concerns today.Patient has added vitamin D to her medications but was unsure of dose, advised her to bring in bottle to clarify.

## 2019-07-01 ENCOUNTER — Other Ambulatory Visit: Payer: Medicare Other

## 2019-07-01 ENCOUNTER — Inpatient Hospital Stay: Payer: Medicare Other

## 2019-07-01 ENCOUNTER — Encounter: Payer: Self-pay | Admitting: Internal Medicine

## 2019-07-01 ENCOUNTER — Other Ambulatory Visit: Payer: Self-pay

## 2019-07-01 ENCOUNTER — Inpatient Hospital Stay: Payer: Medicare Other | Attending: Internal Medicine | Admitting: Internal Medicine

## 2019-07-01 VITALS — BP 94/63 | HR 87 | Temp 98.1°F | Resp 20 | Wt 122.0 lb

## 2019-07-01 DIAGNOSIS — D539 Nutritional anemia, unspecified: Secondary | ICD-10-CM | POA: Diagnosis not present

## 2019-07-01 DIAGNOSIS — Z9981 Dependence on supplemental oxygen: Secondary | ICD-10-CM | POA: Diagnosis not present

## 2019-07-01 DIAGNOSIS — M129 Arthropathy, unspecified: Secondary | ICD-10-CM | POA: Insufficient documentation

## 2019-07-01 DIAGNOSIS — D509 Iron deficiency anemia, unspecified: Secondary | ICD-10-CM | POA: Insufficient documentation

## 2019-07-01 DIAGNOSIS — Z9221 Personal history of antineoplastic chemotherapy: Secondary | ICD-10-CM | POA: Insufficient documentation

## 2019-07-01 DIAGNOSIS — Z803 Family history of malignant neoplasm of breast: Secondary | ICD-10-CM | POA: Insufficient documentation

## 2019-07-01 DIAGNOSIS — Z170 Estrogen receptor positive status [ER+]: Secondary | ICD-10-CM | POA: Insufficient documentation

## 2019-07-01 DIAGNOSIS — E1151 Type 2 diabetes mellitus with diabetic peripheral angiopathy without gangrene: Secondary | ICD-10-CM | POA: Insufficient documentation

## 2019-07-01 DIAGNOSIS — F418 Other specified anxiety disorders: Secondary | ICD-10-CM | POA: Diagnosis not present

## 2019-07-01 DIAGNOSIS — Z87891 Personal history of nicotine dependence: Secondary | ICD-10-CM | POA: Diagnosis not present

## 2019-07-01 DIAGNOSIS — R1310 Dysphagia, unspecified: Secondary | ICD-10-CM | POA: Diagnosis not present

## 2019-07-01 DIAGNOSIS — Z79899 Other long term (current) drug therapy: Secondary | ICD-10-CM | POA: Diagnosis not present

## 2019-07-01 DIAGNOSIS — C155 Malignant neoplasm of lower third of esophagus: Secondary | ICD-10-CM

## 2019-07-01 DIAGNOSIS — C50812 Malignant neoplasm of overlapping sites of left female breast: Secondary | ICD-10-CM | POA: Insufficient documentation

## 2019-07-01 DIAGNOSIS — Z923 Personal history of irradiation: Secondary | ICD-10-CM | POA: Diagnosis not present

## 2019-07-01 DIAGNOSIS — Z992 Dependence on renal dialysis: Secondary | ICD-10-CM | POA: Insufficient documentation

## 2019-07-01 DIAGNOSIS — D631 Anemia in chronic kidney disease: Secondary | ICD-10-CM

## 2019-07-01 DIAGNOSIS — N184 Chronic kidney disease, stage 4 (severe): Secondary | ICD-10-CM

## 2019-07-01 DIAGNOSIS — Z853 Personal history of malignant neoplasm of breast: Secondary | ICD-10-CM | POA: Diagnosis not present

## 2019-07-01 DIAGNOSIS — J449 Chronic obstructive pulmonary disease, unspecified: Secondary | ICD-10-CM | POA: Diagnosis not present

## 2019-07-01 DIAGNOSIS — Z79810 Long term (current) use of selective estrogen receptor modulators (SERMs): Secondary | ICD-10-CM | POA: Insufficient documentation

## 2019-07-01 DIAGNOSIS — I509 Heart failure, unspecified: Secondary | ICD-10-CM | POA: Diagnosis not present

## 2019-07-01 DIAGNOSIS — C3411 Malignant neoplasm of upper lobe, right bronchus or lung: Secondary | ICD-10-CM | POA: Diagnosis not present

## 2019-07-01 DIAGNOSIS — E1122 Type 2 diabetes mellitus with diabetic chronic kidney disease: Secondary | ICD-10-CM | POA: Insufficient documentation

## 2019-07-01 LAB — CBC WITH DIFFERENTIAL/PLATELET
Abs Immature Granulocytes: 0.03 10*3/uL (ref 0.00–0.07)
Basophils Absolute: 0.1 10*3/uL (ref 0.0–0.1)
Basophils Relative: 1 %
Eosinophils Absolute: 0.3 10*3/uL (ref 0.0–0.5)
Eosinophils Relative: 3 %
HCT: 34.1 % — ABNORMAL LOW (ref 36.0–46.0)
Hemoglobin: 10.7 g/dL — ABNORMAL LOW (ref 12.0–15.0)
Immature Granulocytes: 0 %
Lymphocytes Relative: 23 %
Lymphs Abs: 2.2 10*3/uL (ref 0.7–4.0)
MCH: 30.3 pg (ref 26.0–34.0)
MCHC: 31.4 g/dL (ref 30.0–36.0)
MCV: 96.6 fL (ref 80.0–100.0)
Monocytes Absolute: 0.6 10*3/uL (ref 0.1–1.0)
Monocytes Relative: 6 %
Neutro Abs: 6.2 10*3/uL (ref 1.7–7.7)
Neutrophils Relative %: 67 %
Platelets: 259 10*3/uL (ref 150–400)
RBC: 3.53 MIL/uL — ABNORMAL LOW (ref 3.87–5.11)
RDW: 18.4 % — ABNORMAL HIGH (ref 11.5–15.5)
WBC: 9.3 10*3/uL (ref 4.0–10.5)
nRBC: 0 % (ref 0.0–0.2)

## 2019-07-01 LAB — COMPREHENSIVE METABOLIC PANEL
ALT: 23 U/L (ref 0–44)
AST: 26 U/L (ref 15–41)
Albumin: 4 g/dL (ref 3.5–5.0)
Alkaline Phosphatase: 72 U/L (ref 38–126)
Anion gap: 9 (ref 5–15)
BUN: 27 mg/dL — ABNORMAL HIGH (ref 8–23)
CO2: 16 mmol/L — ABNORMAL LOW (ref 22–32)
Calcium: 9.7 mg/dL (ref 8.9–10.3)
Chloride: 113 mmol/L — ABNORMAL HIGH (ref 98–111)
Creatinine, Ser: 1.62 mg/dL — ABNORMAL HIGH (ref 0.44–1.00)
GFR calc Af Amer: 38 mL/min — ABNORMAL LOW (ref >60)
GFR calc non Af Amer: 33 mL/min — ABNORMAL LOW (ref >60)
Glucose, Bld: 78 mg/dL (ref 70–99)
Potassium: 4.1 mmol/L (ref 3.5–5.1)
Sodium: 138 mmol/L (ref 135–145)
Total Bilirubin: 0.8 mg/dL (ref 0.3–1.2)
Total Protein: 8.1 g/dL (ref 6.5–8.1)

## 2019-07-01 LAB — SAMPLE TO BLOOD BANK

## 2019-07-01 LAB — C-REACTIVE PROTEIN: CRP: 0.9 mg/dL (ref <1.0)

## 2019-07-01 LAB — RETICULOCYTES
Immature Retic Fract: 14.4 % (ref 2.3–15.9)
RBC.: 3.53 MIL/uL — ABNORMAL LOW (ref 3.87–5.11)
Retic Count, Absolute: 111.5 10*3/uL (ref 19.0–186.0)
Retic Ct Pct: 3.5 % — ABNORMAL HIGH (ref 0.4–3.1)

## 2019-07-01 NOTE — Progress Notes (Signed)
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 A. LUNG MASS, RIGHT; CT-GUIDED BIOPSY: - ADENOCARCINOMA, ACINAR AND PAPILLARY MORPHOLOGIES. S/p RT [finished Dec 2017] s/p RT [DEC 2017]; AUG 2019- PET-right middle lobe radiation changes no evidence of recurrence.  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  T1b- submucosal involvement; N0- stage I [[name]; [facility]]; unable to proceed with Endo mucosal resection-because of extensive disease [Dr.Branch; [facility]]; recommend definitive radiation  #September 2020 worsening macrocytic anemia-recommend bone marrow biopsy  # 2000-  Left breast cancer [T3N1- stage III] mastecomy s/p RT; s/p chemo; Tamoxifen  # NOV 111 2017- Mol testing [RML- adeno ca]  # 2019-July-aug 2019-ischemic colitis status post right hemicolectomy [Dr.[name]]; acute renal failure;  -----------------------------------------------------------------    DIAGNOSIS: RML Lung ca adeno [stage I] #squamous cell carcinoma esophagus [question stage]   CURRENT/MOST  RECENT THERAPY: surveillance       Breast cancer in female [facility])  07/24/1999 Initial Diagnosis   Breast cancer in female ([place]) T3 N1 M0 tumor ER/PR positive   08/24/1999 -  Anti-estrogen oral therapy   Started tamoxifen   08/24/1999 Surgery   Status post modified radical mastectomy of left breast    Chemotherapy      Radiation Therapy     11/24/2003 - 02/20/2009 Anti-estrogen oral therapy   Started Aromasin   Carcinoma of overlapping sites of left breast in female, estrogen receptor positive ([place])  06/22/2016 Initial Diagnosis   Cancer of overlapping sites of left female breast ([place])   Malignant neoplasm of upper lobe of right lung ([place])  Malignant neoplasm of lower third of esophagus ([place])  05/01/2019 Initial Diagnosis   Malignant neoplasm of lower third of esophagus ([place])     INTERVAL HISTORY:  Danielle [name] 63 y.o.  female pleasant patient above history of lung cancer stage I status post SBRT 2017; and also a remote history of breast cancer is here for follow-up; with new diagnosis of stage I squamous cell esophagus cancer.  In the interim patient was evaluated at [facility] with Dr. Leilani Able further work-up EGD/Lugol's iodine-noted to have more extensive superficial squamous cell cancer.  Endo mucosal resection not done.  Patient is here to discuss further options.  In the interim patient was also admitted to the hospital for severe anemia hemoglobin 6.6.  Needing PRBC transfusion.  No bleeding noted.   Continues to have intermittent difficulty swallowing.  Denies any blood in stools or black or stools.  Patient has chronic shortness of breath chronic cough not any worse.  No swelling in the legs.    Review of Systems  Constitutional: Positive for malaise/fatigue and weight loss.  Negative for chills, diaphoresis and fever.  HENT: Negative for nosebleeds and sore throat.   Eyes: Negative for double vision.  Respiratory: Positive for cough and shortness of breath. Negative for  hemoptysis, sputum production and wheezing.   Cardiovascular: Negative for chest pain, palpitations, orthopnea and leg swelling.  Gastrointestinal: Negative for abdominal pain, blood in stool, constipation, diarrhea, heartburn, melena, nausea and vomiting.       Dysphagia.  Genitourinary: Negative for dysuria, frequency and urgency.  Musculoskeletal: Negative for back pain and joint pain.  Skin: Negative.  Negative for itching and rash.  Neurological: Negative for dizziness, tingling, focal weakness, weakness and headaches.  Endo/Heme/Allergies: Does not bruise/bleed easily.  Psychiatric/Behavioral: Negative for depression. The patient is not nervous/anxious and does not have insomnia.       PAST MEDICAL HISTORY :  Past Medical History:  Diagnosis Date  . Anxiety   . Arthritis   . Breast cancer ([place]) 2000  . Cancer ([place]) left    breast cancer 2000, chemo tx's with total mastectomy and lymph nodes resected.   . Cancer of right lung ([place]) 02/21/2016   rad tx's.   . CHF (congestive heart failure) ([place])   . COPD (chronic obstructive pulmonary disease) ([place])   . Dependence on supplemental oxygen   . Depression   . Diabetes mellitus without complication ([place])   . Heart murmur   . Hypertension   . Lung nodule   . Lymphedema   . Personal history of chemotherapy   . Personal history of radiation therapy   . Shortness of breath dyspnea    with exertion  . Status post chemotherapy 2001   left breast cancer  . Status post radiation therapy 2001   left breast cancer    PAST SURGICAL HISTORY :   Past Surgical History:  Procedure Laterality Date  . Breast Biospy Left    [facility]  . BREAST SURGERY    . COLONOSCOPY N/A 04/30/2018   Procedure: COLONOSCOPY;  Surgeon: Virgel Manifold, MD;  Location: [facility] ENDOSCOPY;  Service: Endoscopy;  Laterality: N/A;  . COLONOSCOPY N/A 07/22/2018   Procedure: COLONOSCOPY;  Surgeon: Virgel Manifold, MD;  Location: [facility] ENDOSCOPY;  Service:  Endoscopy;  Laterality: N/A;  . DILATION AND CURETTAGE OF UTERUS    . ELECTROMAGNETIC NAVIGATION BROCHOSCOPY Right 04/11/2016   Procedure: ELECTROMAGNETIC NAVIGATION BRONCHOSCOPY;  Surgeon: Vilinda Boehringer, MD;  Location: [facility] ORS;  Service: Cardiopulmonary;  Laterality: Right;  . ESOPHAGOGASTRODUODENOSCOPY N/A 07/22/2018   Procedure: ESOPHAGOGASTRODUODENOSCOPY (EGD);  Surgeon: Virgel Manifold, MD;  Location: [facility] ENDOSCOPY;  Service: Endoscopy;  Laterality: N/A;  . ESOPHAGOGASTRODUODENOSCOPY (EGD) WITH PROPOFOL N/A 05/07/2018   Procedure: ESOPHAGOGASTRODUODENOSCOPY (EGD) WITH PROPOFOL;  Surgeon: Lucilla Lame, MD;  Location: [facility] ENDOSCOPY;  Service: Endoscopy;  Laterality: N/A;  . ESOPHAGOGASTRODUODENOSCOPY (EGD) WITH PROPOFOL N/A 04/24/2019   Procedure: ESOPHAGOGASTRODUODENOSCOPY (EGD) WITH PROPOFOL;  Surgeon: Jonathon Bellows, MD;  Location: [facility] ENDOSCOPY;  Service: Gastroenterology;  Laterality: N/A;  . EUS N/A 05/07/2019   Procedure: FULL UPPER ENDOSCOPIC ULTRASOUND (EUS) RADIAL;  Surgeon: Jola Schmidt, MD;  Location: [facility] ENDOSCOPY;  Service: Endoscopy;  Laterality: N/A;  . history of colonoscopy]    . ILEOSCOPY N/A 07/22/2018   Procedure: ILEOSCOPY THROUGH STOMA;  Surgeon: Virgel Manifold, MD;  Location: [facility] ENDOSCOPY;  Service: Endoscopy;  Laterality: N/A;  . ILEOSTOMY    . ILEOSTOMY N/A 09/08/2018   Procedure: ILEOSTOMY REVISION POSSIBLE CREATION;  Surgeon: Herbert Pun, MD;  Location: [facility] ORS;  Service: General;  Laterality: N/A;  .  ILEOSTOMY CLOSURE N/A 08/15/2018   Procedure: DILATION OF ILEOSTOMY STRICTURE;  Surgeon: Herbert Pun, MD;  Location: [facility] ORS;  Service: General;  Laterality: N/A;  . LAPAROTOMY Right 05/04/2018   Procedure: EXPLORATORY LAPAROTOMY right colectomy right and left ostomy;  Surgeon: Herbert Pun, MD;  Location: [facility] ORS;  Service: General;  Laterality: Right;  . LUNG BIOPSY    . MASTECTOMY Left    2000, [facility]  . ROTATOR CUFF REPAIR  Right    [facility]    FAMILY HISTORY :   Family History  Problem Relation Age of Onset  . Breast cancer Mother 58  . Cancer Mother        Breast   . Cirrhosis Father   . Breast cancer Paternal Aunt 60  . Cancer Maternal Aunt        Breast     SOCIAL HISTORY:   Social History   Tobacco Use  . Smoking status: Former Smoker    Packs/day: 0.50    Years: 20.00    Pack years: 10.00    Types: Cigarettes    Quit date: 07/02/2012    Years since quitting: 7.0  . Smokeless tobacco: Current User    Types: Snuff  . Tobacco comment: quit 2014  Substance Use Topics  . Alcohol use: Not Currently    Comment: Occasionally  . Drug use: No    ALLERGIES:  has No Known Allergies.  MEDICATIONS:  Current Outpatient Medications  Medication Sig Dispense Refill  . acetaminophen (TYLENOL) 325 MG tablet Take 2 tablets (650 mg total) by mouth every 6 (six) hours as needed for mild pain (or Fever >/= 101).    [name] albuterol (PROVENTIL HFA;VENTOLIN HFA) 108 (90 Base) MCG/ACT inhaler Inhale 2 puffs into the lungs every 6 (six) hours as needed for wheezing or shortness of breath. 1 Inhaler 2  . calcium-vitamin D (OSCAL WITH D) 250-125 MG-UNIT tablet Take 1 tablet by mouth daily.    . citalopram (CELEXA) 40 MG tablet Take 40 mg by mouth daily.    . ferrous sulfate 325 (65 FE) MG tablet Take 1 tablet (325 mg total) by mouth 2 (two) times daily with a meal. 60 tablet 3  . Fluticasone-Umeclidin-Vilant 100-62.5-25 MCG/INH AEPB Inhale 1 puff into the lungs daily.     . furosemide (LASIX) 20 MG tablet Take 1 tablet (20 mg total) by mouth daily. And additional '20mg'$  dose as needed (Patient taking differently: Take 20-40 mg by mouth See admin instructions. Take 1 tablet ('20mg'$ ) by mouth daily - may take a second tablet ('20mg'$ ) by mouth daily as needed for severe swelling) 40 tablet 5  . guaiFENesin-dextromethorphan (ROBITUSSIN DM) 100-10 MG/5ML syrup Take 5 mLs by mouth every 4 (four) hours as needed for cough.    .  loperamide (IMODIUM A-D) 2 MG tablet Take 1 tablet (2 mg total) by mouth as needed for diarrhea or loose stools. 30 tablet 0  . Multiple Vitamin (MULTIVITAMIN WITH MINERALS) TABS tablet Take 1 tablet by mouth daily. 30 tablet 1  . pantoprazole (PROTONIX) 40 MG tablet Take 1 tablet (40 mg total) by mouth 2 (two) times daily before a meal. 60 tablet 0  . potassium chloride (K-DUR) 10 MEQ tablet Take 10 mEq by mouth daily.     . sucralfate (CARAFATE) 1 GM/10ML suspension Take 10 mLs (1 g total) by mouth 4 (four) times daily. 420 mL 0   No current facility-administered medications for this visit.     PHYSICAL EXAMINATION: ECOG PERFORMANCE STATUS:  1 - Symptomatic but completely ambulatory  BP 94/63 (BP Location: Left Arm, Patient Position: Sitting)   Pulse 87   Temp 98.1 F (36.7 C) (Tympanic)   Resp 20   Wt 122 lb (55.3 kg)   BMI 19.69 kg/m   Filed Weights   07/01/19 1122  Weight: 122 lb (55.3 kg)    Physical Exam  Constitutional: She is oriented to person, place, and time.  Thin built moderately nourished female patient.  She is walking herself.  She is alone.  HENT:  Head: Normocephalic and atraumatic.  Mouth/Throat: Oropharynx is clear and moist. No oropharyngeal exudate.  Eyes: Pupils are equal, round, and reactive to light.  Neck: Normal range of motion. Neck supple.  Cardiovascular: Normal rate and regular rhythm.  Pulmonary/Chest: No respiratory distress. She has no wheezes.  Decreased breath sounds bilaterally.  No wheeze or crackles.  Abdominal: Soft. Bowel sounds are normal. She exhibits no distension and no mass. There is no abdominal tenderness. There is no rebound and no guarding.  Musculoskeletal: Normal range of motion.        General: No tenderness or edema.  Neurological: She is alert and oriented to person, place, and time.  Skin: Skin is warm.  Psychiatric: Affect normal.       LABORATORY DATA:  I have reviewed the data as listed    Component Value  Date/Time   NA 138 06/20/2019 0650   NA 132 (L) 02/09/2015 1100   K 3.7 06/20/2019 0650   K 3.8 02/09/2015 1100   CL 114 (H) 06/20/2019 0650   CL 95 (L) 02/09/2015 1100   CO2 14 (L) 06/20/2019 0650   CO2 29 02/09/2015 1100   GLUCOSE 88 06/20/2019 0650   GLUCOSE 105 (H) 02/09/2015 1100   BUN 28 (H) 06/20/2019 0650   BUN 16 02/09/2015 1100   CREATININE 1.84 (H) 06/20/2019 0650   CREATININE 0.81 02/09/2015 1100   CALCIUM 9.1 06/20/2019 0650   CALCIUM 9.1 02/09/2015 1100   PROT 7.0 06/19/2019 0856   PROT 7.7 02/09/2015 1100   ALBUMIN 3.7 06/19/2019 0856   ALBUMIN 4.3 02/09/2015 1100   AST 23 06/19/2019 0856   AST 29 02/09/2015 1100   ALT 14 06/19/2019 0856   ALT 20 02/09/2015 1100   ALKPHOS 65 06/19/2019 0856   ALKPHOS 69 02/09/2015 1100   BILITOT 0.2 (L) 06/19/2019 0856   BILITOT 0.9 02/09/2015 1100   GFRNONAA 28 (L) 06/20/2019 0650   GFRNONAA >60 02/09/2015 1100   GFRAA 33 (L) 06/20/2019 0650   GFRAA >60 02/09/2015 1100    No results found for: SPEP, UPEP  Lab Results  Component Value Date   WBC 8.8 06/20/2019   NEUTROABS 5.0 06/19/2019   HGB 8.8 (L) 06/20/2019   HCT 27.3 (L) 06/20/2019   MCV 94.1 06/20/2019   PLT 340 06/20/2019      Chemistry      Component Value Date/Time   NA 138 06/20/2019 0650   NA 132 (L) 02/09/2015 1100   K 3.7 06/20/2019 0650   K 3.8 02/09/2015 1100   CL 114 (H) 06/20/2019 0650   CL 95 (L) 02/09/2015 1100   CO2 14 (L) 06/20/2019 0650   CO2 29 02/09/2015 1100   BUN 28 (H) 06/20/2019 0650   BUN 16 02/09/2015 1100   CREATININE 1.84 (H) 06/20/2019 0650   CREATININE 0.81 02/09/2015 1100      Component Value Date/Time   CALCIUM 9.1 06/20/2019 0650   CALCIUM 9.1 02/09/2015 1100  ALKPHOS 65 06/19/2019 0856   ALKPHOS 69 02/09/2015 1100   AST 23 06/19/2019 0856   AST 29 02/09/2015 1100   ALT 14 06/19/2019 0856   ALT 20 02/09/2015 1100   BILITOT 0.2 (L) 06/19/2019 0856   BILITOT 0.9 02/09/2015 1100       RADIOGRAPHIC  STUDIES: I have personally reviewed the radiological images as listed and agreed with the findings in the report. No results found.   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  July 2020 PET scan-no evidence of any active disease in the lung or in the esophagus or any mediastinal adenopathy.  Endoscopic ultrasound-T1 N0.  Unfortunately Endo mucosal resection could not be done because of the extensive disease.  Discussed with [facility] Harl Bowie at [facility].  #Recommend definitive radiation.  Would not recommend addition of chemotherapy given stage I disease.  #Weight loss dysphagia-secondary to esophagus cancer.  Discussed the possible need for a feeding tube/PEG tube if she continues weight.  She unfortunately have worsening dysphagia/radiation-induced-with worsening weight loss while on radiation.  This should eventually get better post radiation.  We will make referral to [facility]  #Severe macrocytic anemia -Nadir hemoglobin 6.6 status post PRBC transfusion.  Recommend work-up including myeloma/copper zinc levels; recommend a bone marrow biopsy for further evaluation.  #  Right upper lobe stage I adenocarcinoma the lung status post SBRT 2017; July 2020 PET scan-no evidence of recurrence.  Stable  # History of ER/PR positive HER-2/neu negative breast cancer stage III [2000]- clinically no evidence of recurrence.  Stable  #Peripheral vascular disease- OFF eliquis given anemia ;   #Chronic renal insufficiency-stage III-IV-clinically stable.  Awaiting labs today.  I spoke at length with the patient's family, daughter- regarding the patient's clinical status/plan of care.  Family agreement.    # DISPOSITION: # labs today [ordered] # referral to Dr.Crystal re: esophagus cancer # referral to [facility]- weight loss # Bone marrow biospy asap # follow up in 2 weeks- MD- lab- cbc/hold tube- Dr.B   # 40 minutes  face-to-face with the patient discussing the above plan of care; more than 50% of time spent on prognosis/ natural history; counseling and coordination.    Orders Placed This Encounter  Procedures  . CT BONE MARROW BIOPSY & ASPIRATION    Standing Status:   Future    Standing Expiration Date:   09/29/2020    Order Specific Question:   Reason for Exam (SYMPTOM  OR DIAGNOSIS REQUIRED)    Answer:   severe anemia/ CKD    Order Specific Question:   Preferred imaging location?    Answer:   [facility]    Order Specific Question:   Radiology Contrast Protocol - do NOT remove file path    Answer:   [email or web address]  . C-reactive protein    Standing Status:   Future    Number of Occurrences:   1    Standing Expiration Date:   08/04/2020  . CBC with Differential    Standing Status:   Future    Standing Expiration Date:   06/30/2020  . Sample to Blood Bank    Standing Status:   Future    Number of Occurrences:   1    Standing Expiration Date:   06/30/2020  . Hold Tube- Blood Bank    Standing Status:   Future    Standing Expiration Date:   06/30/2020   All questions were  answered. The patient knows to call the clinic with any problems, questions or concerns.      [name], MD 07/01/2019 12:37 PM

## 2019-07-01 NOTE — Assessment & Plan Note (Addendum)
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  July 2020 PET scan-no evidence of any active disease in the lung or in the esophagus or any mediastinal adenopathy.  Endoscopic ultrasound-T1 N0.  Unfortunately Endo mucosal resection could not be done because of the extensive disease.  Discussed with Dr. Harl Bowie at [facility].  #Recommend definitive radiation.  Would not recommend addition of chemotherapy given stage I disease.  #Weight loss dysphagia-secondary to esophagus cancer.  Discussed the possible need for a feeding tube/PEG tube if she continues weight.  She unfortunately have worsening dysphagia/radiation-induced-with worsening weight loss while on radiation.  This should eventually get better post radiation.  We will make referral to [facility]  #Severe macrocytic anemia -Nadir hemoglobin 6.6 status post PRBC transfusion.  Recommend work-up including myeloma/copper zinc levels; recommend a bone marrow biopsy for further evaluation.  #  Right upper lobe stage I adenocarcinoma the lung status post SBRT 2017; July 2020 PET scan-no evidence of recurrence.  Stable  # History of ER/PR positive HER-2/neu negative breast cancer stage III [2000]- clinically no evidence of recurrence.  Stable  #Peripheral vascular disease- OFF eliquis given anemia ;   #Chronic renal insufficiency-stage III-IV-clinically stable.  Awaiting labs today.  I spoke at length with the patient's family, daughter- regarding the patient's clinical status/plan of care.  Family agreement.    # DISPOSITION: # labs today [ordered] # referral to Dr.Crystal re: esophagus cancer # referral to [facility]- weight loss # Bone marrow biospy asap # follow up in 2 weeks- MD- lab- cbc/hold tube- Dr.B   # 40 minutes face-to-face with the patient discussing the above plan of care; more than 50% of time spent on prognosis/ natural history; counseling and coordination.

## 2019-07-02 LAB — KAPPA/LAMBDA LIGHT CHAINS
Kappa free light chain: 40.3 mg/L — ABNORMAL HIGH (ref 3.3–19.4)
Kappa, lambda light chain ratio: 0.33 (ref 0.26–1.65)
Lambda free light chains: 122.9 mg/L — ABNORMAL HIGH (ref 5.7–26.3)

## 2019-07-02 LAB — HAPTOGLOBIN: Haptoglobin: 62 mg/dL (ref 37–355)

## 2019-07-03 LAB — MULTIPLE MYELOMA PANEL, SERUM
Albumin SerPl Elph-Mcnc: 3.9 g/dL (ref 2.9–4.4)
Albumin/Glob SerPl: 1.2 (ref 0.7–1.7)
Alpha 1: 0.3 g/dL (ref 0.0–0.4)
Alpha2 Glob SerPl Elph-Mcnc: 0.8 g/dL (ref 0.4–1.0)
B-Globulin SerPl Elph-Mcnc: 0.9 g/dL (ref 0.7–1.3)
Gamma Glob SerPl Elph-Mcnc: 1.4 g/dL (ref 0.4–1.8)
Globulin, Total: 3.5 g/dL (ref 2.2–3.9)
IgA: 211 mg/dL (ref 87–352)
IgG (Immunoglobin G), Serum: 1516 mg/dL (ref 586–1602)
IgM (Immunoglobulin M), Srm: 33 mg/dL (ref 26–217)
M Protein SerPl Elph-Mcnc: 0.7 g/dL — ABNORMAL HIGH
Total Protein ELP: 7.4 g/dL (ref 6.0–8.5)

## 2019-07-04 LAB — ZINC: Zinc: 103 ug/dL (ref 56–134)

## 2019-07-04 LAB — COPPER, SERUM: Copper: 181 ug/dL — ABNORMAL HIGH (ref 72–166)

## 2019-07-06 ENCOUNTER — Telehealth: Payer: Self-pay | Admitting: Internal Medicine

## 2019-07-06 DIAGNOSIS — N184 Chronic kidney disease, stage 4 (severe): Secondary | ICD-10-CM

## 2019-07-06 DIAGNOSIS — C155 Malignant neoplasm of lower third of esophagus: Secondary | ICD-10-CM

## 2019-07-06 DIAGNOSIS — D631 Anemia in chronic kidney disease: Secondary | ICD-10-CM

## 2019-07-06 NOTE — Telephone Encounter (Signed)
Please cancel the patient's appointment with me in October  Please have the patient follow-up with me in about 2 weeks-to review the results of the bone marrow biopsy; CBC- Hold tube/possible PRBC transfusion  #Patient needs referral to Dr. Donella Stade ASAP- This was recommended at last visit. Dx-esophagus cancer.

## 2019-07-06 NOTE — Addendum Note (Signed)
Addended by: Sabino Gasser on: 07/06/2019 02:47 PM   Modules accepted: Orders

## 2019-07-07 ENCOUNTER — Other Ambulatory Visit: Payer: Self-pay | Admitting: Physician Assistant

## 2019-07-07 ENCOUNTER — Other Ambulatory Visit: Payer: Self-pay | Admitting: Radiology

## 2019-07-07 ENCOUNTER — Ambulatory Visit (INDEPENDENT_AMBULATORY_CARE_PROVIDER_SITE_OTHER): Payer: Medicare Other | Admitting: Gastroenterology

## 2019-07-07 DIAGNOSIS — D62 Acute posthemorrhagic anemia: Secondary | ICD-10-CM | POA: Diagnosis not present

## 2019-07-07 NOTE — Progress Notes (Signed)
Jonathon Bellows , MD [address]  [place]  [place], [place] [address]  Main: [phone]  Fax: [phone]   Primary Care Physician: [facility]  Virtual Visit via Telephone Note  I connected with patient on 07/07/19 at  2:30 PM EDT by telephone and verified that I am speaking with the correct person using two identifiers.   I discussed the limitations, risks, security and privacy concerns of performing an evaluation and management service by telephone and the availability of in person appointments. I also discussed with the patient that there may be a patient responsible charge related to this service. The patient expressed understanding and agreed to proceed.  Location of Patient: Home Location of Provider: Home Persons involved: Patient and provider only   History of Present Illness: Anemia follow-up  HPI: ALIJAH [name] is a 63 y.o. 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  Follows with Dr. Rogue Bussing for the same.  Admitted on 06/19/2019 seen by Dr. Bonna Gains during the admission for anemia.  No overt blood loss.  Discharged.  Hemoglobin 6 days back was 10.7 g.  2 weeks back was 8.8 g.  She says that she had the EUS at [facility] and was told that the tumor was not resectable and is undergoing radiation soon.  States that her stool is not black in color.  Current Outpatient Medications  Medication Sig Dispense Refill  . acetaminophen (TYLENOL) 325 MG tablet Take 2 tablets (650 mg total) by mouth every 6 (six) hours as needed for mild pain (or Fever >/= 101).    [name] albuterol (PROVENTIL HFA;VENTOLIN HFA) 108 (90 Base) MCG/ACT inhaler Inhale 2 puffs into the lungs every 6 (six) hours as needed for wheezing or shortness of breath. 1 Inhaler 2  . calcium-vitamin D (OSCAL WITH D) 250-125 MG-UNIT tablet Take 1 tablet by mouth daily.     . citalopram (CELEXA) 40 MG tablet Take 40 mg by mouth daily.    . ferrous sulfate 325 (65 FE) MG tablet Take 1 tablet (325 mg total) by mouth 2 (two) times daily with a meal. 60 tablet 3  . Fluticasone-Umeclidin-Vilant 100-62.5-25 MCG/INH AEPB Inhale 1 puff into the lungs daily.     . furosemide (LASIX) 20 MG tablet Take 1 tablet (20 mg total) by mouth daily. And additional 20mg  dose as needed (Patient taking differently: Take 20-40 mg by mouth See admin instructions. Take 1 tablet (20mg ) by mouth daily - may take a second tablet (20mg ) by mouth daily as needed for severe swelling) 40 tablet 5  . guaiFENesin-dextromethorphan (ROBITUSSIN DM) 100-10 MG/5ML syrup Take 5 mLs by mouth every 4 (four) hours as needed for cough.    . loperamide (IMODIUM A-D) 2 MG tablet Take 1 tablet (2 mg total) by mouth as needed for diarrhea or loose stools. 30 tablet 0  . Multiple Vitamin (MULTIVITAMIN WITH MINERALS) TABS tablet Take 1 tablet by mouth daily. 30 tablet 1  . pantoprazole (PROTONIX) 40 MG tablet Take 1 tablet (40 mg total) by mouth 2 (two) times daily before a meal. 60 tablet 0  . potassium chloride (K-DUR) 10 MEQ tablet Take 10 mEq by mouth daily.     . sucralfate (CARAFATE) 1 GM/10ML suspension Take 10 mLs (1 g total) by mouth 4 (four) times daily. 420 mL 0   No current facility-administered medications for this visit.  Allergies as of 07/07/2019  . (No Known Allergies)    Review of Systems:    All systems reviewed and negative except where noted in HPI.   Observations/Objective:  Labs: CMP     Component Value Date/Time   NA 138 07/01/2019 1219   NA 132 (L) 02/09/2015 1100   K 4.1 07/01/2019 1219   K 3.8 02/09/2015 1100   CL 113 (H) 07/01/2019 1219   CL 95 (L) 02/09/2015 1100   CO2 16 (L) 07/01/2019 1219   CO2 29 02/09/2015 1100   GLUCOSE 78 07/01/2019 1219   GLUCOSE 105 (H) 02/09/2015 1100   BUN 27 (H) 07/01/2019 1219   BUN 16 02/09/2015 1100   CREATININE 1.62 (H) 07/01/2019  1219   CREATININE 0.81 02/09/2015 1100   CALCIUM 9.7 07/01/2019 1219   CALCIUM 9.1 02/09/2015 1100   PROT 8.1 07/01/2019 1219   PROT 7.7 02/09/2015 1100   ALBUMIN 4.0 07/01/2019 1219   ALBUMIN 4.3 02/09/2015 1100   AST 26 07/01/2019 1219   AST 29 02/09/2015 1100   ALT 23 07/01/2019 1219   ALT 20 02/09/2015 1100   ALKPHOS 72 07/01/2019 1219   ALKPHOS 69 02/09/2015 1100   BILITOT 0.8 07/01/2019 1219   BILITOT 0.9 02/09/2015 1100   GFRNONAA 33 (L) 07/01/2019 1219   GFRNONAA >60 02/09/2015 1100   GFRAA 38 (L) 07/01/2019 1219   GFRAA >60 02/09/2015 1100   Lab Results  Component Value Date   WBC 9.3 07/01/2019   HGB 10.7 (L) 07/01/2019   HCT 34.1 (L) 07/01/2019   MCV 96.6 07/01/2019   PLT 259 07/01/2019    Imaging Studies: Dg Chest 1 View  Result Date: 06/19/2019 CLINICAL DATA:  Shortness of breath. Hypotension. EXAM: CHEST  1 VIEW COMPARISON:  Chest x-ray dated 12/22/2018 FINDINGS: The heart size and pulmonary vascularity are normal. Scarring in the right midzone, unchanged. No infiltrates or effusions. Aortic atherosclerosis. No acute bone abnormality. IMPRESSION: No acute cardiopulmonary disease. Scarring in the right midzone. Aortic atherosclerosis. Electronically Signed   By: Lorriane Shire M.D.   On: 06/19/2019 12:13   Dg Bone Density (dxa)  Result Date: 06/24/2019 EXAM: DUAL X-RAY ABSORPTIOMETRY (DXA) FOR BONE MINERAL DENSITY IMPRESSION: Dear Dr. Daryel Gerald, Your patient Danielle [name] completed a FRAX assessment on 06/24/2019 using the [place] (analysis version: 14.10) manufactured by [employer]. The following summarizes the results of our evaluation. PATIENT BIOGRAPHICAL: Name: Danielle, [name] Patient ID: [id number] Birth Date: [birth year] Height:    66.0 in. Gender:     Female    [age]       Weight:    127.0 lbs. Ethnicity:  Black                            Exam Date: 06/24/2019 FRAX* RESULTS:  (version: 3.5) 10-year Probability of Fracture1 Major  Osteoporotic Fracture2 Hip Fracture 5.4% 0.5% Population: [place] (Black) Risk Factors: History of Fracture (Adult) Based on Femur (Left) Neck BMD 1 -The 10-year probability of fracture may be lower than reported if the patient has received treatment. 2 -Major Osteoporotic Fracture: Clinical Spine, Forearm, Hip or Shoulder *FRAX is a Materials engineer of the [employer] for Metabolic Bone Disease, a [employer] (WHO) [facility]. ASSESSMENT: The probability of a major osteoporotic fracture is 5.4% within the next ten years. The probability of a hip fracture is  0.5% within the next ten years. . Technologist: [employer] PATIENT BIOGRAPHICAL: Name: Binnie, Droessler Patient ID: [id number] Birth Date: Oct 11, 1956 Height: 66.0 in. Gender: Female Exam Date: 06/24/2019 Weight: 127.0 lbs. Indications: COPD, Height Loss, History of Breast Cancer, History of Fracture (Adult), History of Lung Cancer, Postmenopausal Fractures: Left wrist, Right Wrist Treatments: Multi-Vitamin, Protonix ASSESSMENT: The BMD measured at Femur Total Left is 0.768 g/cm2 with a T-score of -1.9. This patient is considered osteopenic according to [facility]) criteria. Lumbar spine was not utilized due to advanced degenerative changes/scoliosis. The scan quality is good. Site Region Measured Measured WHO Young Adult BMD Date       Age      Classification T-score DualFemur Total Left 06/24/2019 65.2 Osteopenia -1.9 0.768 g/cm2 DualFemur Total Left 02[birth year]19/2006 51.8 Normal -0.2 0.984 g/cm2 DualFemur Total Mean 06/24/2019 65.2 Osteopenia -1.8 0.782 g/cm2 DualFemur Total Mean 02/19/2006 51.8 Normal -0.1 0.998 g/cm2 Left Forearm Radius 33% 06/24/2019 65.2 Normal 0.1 0.886 g/cm2 World Health Organization [facility]) criteria for post-menopausal, Caucasian Women: Normal:       T-score at or above -1 SD Osteopenia:   T-score between -1 and -2.5 SD Osteoporosis: T-score at or below -2.5 SD RECOMMENDATIONS: 1. All  patients should optimize calcium and vitamin D intake. 2. Consider FDA-approved medical therapies in postmenopausal women and men aged 19 years and older, based on the following: a. A hip or vertebral(clinical or morphometric) fracture b. T-score < -2.5 at the femoral neck or spine after appropriate evaluation to exclude secondary causes c. Low bone mass (T-score between -1.0 and -2.5 at the femoral neck or spine) and a 10-year probability of a hip fracture > 3% or a 10-year probability of a major osteoporosis-related fracture > 20% based on the [place]-adapted WHO algorithm d. Clinician judgment and/or patient preferences may indicate treatment for people with 10-year fracture probabilities above or below these levels FOLLOW-UP: People with diagnosed cases of osteoporosis or at high risk for fracture should have regular bone mineral density tests. For patients eligible for Medicare, routine testing is allowed once every 2 years. The testing frequency can be increased to one year for patients who have rapidly progressing disease, those who are receiving or discontinuing medical therapy to restore bone mass, or have additional risk factors. I have reviewed this report, and agree with the above findings. [facility] Electronically Signed   By: Lowella Grip III M.D.   On: 06/24/2019 10:45    Assessment and Plan:   ALEX MCMANIGAL is a 63 y.o. y/o female with a history of squamous cell carcinoma of the esophagus recently diagnosed.  Here for follow-up from a recent hospitalization for anemia.  Denies any overt blood loss at this point of time.  She is being followed by Dr. Rogue Bussing.  Recent hemoglobin shows it has improved.  I will inform the patient if her stool turns black in color to contact our office.  If the tumor were to bleed in the future we could consider treating with hemo-spray which has shown to reduce the risk of rebleeding even in cancers.  Follow-up as needed    I discussed the  assessment and treatment plan with the patient. The patient was provided an opportunity to ask questions and all were answered. The patient agreed with the plan and demonstrated an understanding of the instructions.   The patient was advised to call back or seek an in-person evaluation if the symptoms worsen or if the condition fails to improve as anticipated.  I provided 6 minutes of non-face-to-face time during this encounter.  Dr Jonathon Bellows MD,MRCP [facility]) Gastroenterology/Hepatology Pager: [phone]   Speech recognition software was used to dictate this note.

## 2019-07-08 ENCOUNTER — Ambulatory Visit
Admission: RE | Admit: 2019-07-08 | Discharge: 2019-07-08 | Disposition: A | Discharge status: Home / Self Care | Payer: Medicare Other | Source: Ambulatory Visit | Attending: Internal Medicine | Admitting: Internal Medicine

## 2019-07-08 ENCOUNTER — Other Ambulatory Visit: Payer: Self-pay

## 2019-07-08 DIAGNOSIS — D539 Nutritional anemia, unspecified: Secondary | ICD-10-CM | POA: Insufficient documentation

## 2019-07-08 DIAGNOSIS — Z853 Personal history of malignant neoplasm of breast: Secondary | ICD-10-CM | POA: Insufficient documentation

## 2019-07-08 DIAGNOSIS — M1990 Unspecified osteoarthritis, unspecified site: Secondary | ICD-10-CM | POA: Insufficient documentation

## 2019-07-08 DIAGNOSIS — F329 Major depressive disorder, single episode, unspecified: Secondary | ICD-10-CM | POA: Diagnosis not present

## 2019-07-08 DIAGNOSIS — Z923 Personal history of irradiation: Secondary | ICD-10-CM | POA: Insufficient documentation

## 2019-07-08 DIAGNOSIS — I509 Heart failure, unspecified: Secondary | ICD-10-CM | POA: Diagnosis not present

## 2019-07-08 DIAGNOSIS — Z803 Family history of malignant neoplasm of breast: Secondary | ICD-10-CM | POA: Insufficient documentation

## 2019-07-08 DIAGNOSIS — Z87891 Personal history of nicotine dependence: Secondary | ICD-10-CM | POA: Diagnosis not present

## 2019-07-08 DIAGNOSIS — Z9221 Personal history of antineoplastic chemotherapy: Secondary | ICD-10-CM | POA: Diagnosis not present

## 2019-07-08 DIAGNOSIS — I110 Hypertensive heart disease with heart failure: Secondary | ICD-10-CM | POA: Insufficient documentation

## 2019-07-08 DIAGNOSIS — Z79899 Other long term (current) drug therapy: Secondary | ICD-10-CM | POA: Insufficient documentation

## 2019-07-08 DIAGNOSIS — Z85118 Personal history of other malignant neoplasm of bronchus and lung: Secondary | ICD-10-CM | POA: Diagnosis not present

## 2019-07-08 DIAGNOSIS — F419 Anxiety disorder, unspecified: Secondary | ICD-10-CM | POA: Diagnosis not present

## 2019-07-08 DIAGNOSIS — Z9981 Dependence on supplemental oxygen: Secondary | ICD-10-CM | POA: Insufficient documentation

## 2019-07-08 DIAGNOSIS — Z9012 Acquired absence of left breast and nipple: Secondary | ICD-10-CM | POA: Diagnosis not present

## 2019-07-08 DIAGNOSIS — J449 Chronic obstructive pulmonary disease, unspecified: Secondary | ICD-10-CM | POA: Diagnosis not present

## 2019-07-08 LAB — CBC WITH DIFFERENTIAL/PLATELET
Abs Immature Granulocytes: 0.02 10*3/uL (ref 0.00–0.07)
Basophils Absolute: 0.1 10*3/uL (ref 0.0–0.1)
Basophils Relative: 1 %
Eosinophils Absolute: 0.4 10*3/uL (ref 0.0–0.5)
Eosinophils Relative: 6 %
HCT: 30.6 % — ABNORMAL LOW (ref 36.0–46.0)
Hemoglobin: 9.9 g/dL — ABNORMAL LOW (ref 12.0–15.0)
Immature Granulocytes: 0 %
Lymphocytes Relative: 29 %
Lymphs Abs: 2 10*3/uL (ref 0.7–4.0)
MCH: 30.2 pg (ref 26.0–34.0)
MCHC: 32.4 g/dL (ref 30.0–36.0)
MCV: 93.3 fL (ref 80.0–100.0)
Monocytes Absolute: 0.7 10*3/uL (ref 0.1–1.0)
Monocytes Relative: 10 %
Neutro Abs: 3.7 10*3/uL (ref 1.7–7.7)
Neutrophils Relative %: 54 %
Platelets: 276 10*3/uL (ref 150–400)
RBC: 3.28 MIL/uL — ABNORMAL LOW (ref 3.87–5.11)
RDW: 16.7 % — ABNORMAL HIGH (ref 11.5–15.5)
WBC: 7 10*3/uL (ref 4.0–10.5)
nRBC: 0.3 % — ABNORMAL HIGH (ref 0.0–0.2)

## 2019-07-08 LAB — PROTIME-INR
INR: 1 (ref 0.8–1.2)
Prothrombin Time: 13.4 seconds (ref 11.4–15.2)

## 2019-07-08 MED ORDER — HEPARIN SOD (PORK) LOCK FLUSH 100 UNIT/ML IV SOLN
INTRAVENOUS | Status: AC
  Filled 2019-07-08: qty 5

## 2019-07-08 MED ORDER — FENTANYL CITRATE (PF) 100 MCG/2ML IJ SOLN
INTRAMUSCULAR | Status: AC | PRN
  Administered 2019-07-08 (×2): 50 ug via INTRAVENOUS

## 2019-07-08 MED ORDER — FENTANYL CITRATE (PF) 100 MCG/2ML IJ SOLN
INTRAMUSCULAR | Status: AC
  Filled 2019-07-08: qty 2

## 2019-07-08 MED ORDER — MIDAZOLAM HCL 2 MG/2ML IJ SOLN
INTRAMUSCULAR | Status: AC | PRN
  Administered 2019-07-08 (×3): 1 mg via INTRAVENOUS

## 2019-07-08 MED ORDER — SODIUM CHLORIDE 0.9 % IV SOLN
INTRAVENOUS | Status: DC
  Administered 2019-07-08: 08:00:00 via INTRAVENOUS

## 2019-07-08 MED ORDER — MIDAZOLAM HCL 5 MG/5ML IJ SOLN
INTRAMUSCULAR | Status: AC
  Filled 2019-07-08: qty 5

## 2019-07-08 NOTE — Procedures (Signed)
Interventional Radiology Procedure Note  Procedure: CT guided aspirate and core biopsy of left iliac bone Complications: None Recommendations: - Bedrest supine x 1 hrs - OTC's PRN  Pain - Follow biopsy results  Signed,  Dellas Guard S. Fayne Mcguffee, DO      

## 2019-07-08 NOTE — H&P (Signed)
Chief Complaint: Anemia  Referring Physician(s): [name]  Supervising Physician: Corrie Mckusick  Patient Status: [facility] - Out-pt  History of Present Illness: Danielle [name] is a 63 y.o. female with history of anemia, presenting for a bone marrow biopsy.    She denies any new fever rigors chills.     Past Medical History:  Diagnosis Date  . Anxiety   . Arthritis   . Breast cancer ([place]) 2000  . Cancer ([place]) left    breast cancer 2000, chemo tx's with total mastectomy and lymph nodes resected.   . Cancer of right lung ([place]) 02/21/2016   rad tx's.   . CHF (congestive heart failure) ([place])   . COPD (chronic obstructive pulmonary disease) ([place])   . Dependence on supplemental oxygen   . Depression   . Heart murmur   . Hypertension   . Lung nodule   . Lymphedema   . Personal history of chemotherapy   . Personal history of radiation therapy   . Shortness of breath dyspnea    with exertion  . Status post chemotherapy 2001   left breast cancer  . Status post radiation therapy 2001   left breast cancer    Past Surgical History:  Procedure Laterality Date  . Breast Biospy Left    [facility]  . BREAST SURGERY    . COLONOSCOPY N/A 04/30/2018   Procedure: COLONOSCOPY;  Surgeon: Virgel Manifold, MD;  Location: [facility] ENDOSCOPY;  Service: Endoscopy;  Laterality: N/A;  . COLONOSCOPY N/A 07/22/2018   Procedure: COLONOSCOPY;  Surgeon: Virgel Manifold, MD;  Location: [facility] ENDOSCOPY;  Service: Endoscopy;  Laterality: N/A;  . DILATION AND CURETTAGE OF UTERUS    . ELECTROMAGNETIC NAVIGATION BROCHOSCOPY Right 04/11/2016   Procedure: ELECTROMAGNETIC NAVIGATION BRONCHOSCOPY;  Surgeon: Vilinda Boehringer, MD;  Location: [facility] ORS;  Service: Cardiopulmonary;  Laterality: Right;  . ESOPHAGOGASTRODUODENOSCOPY N/A 07/22/2018   Procedure: ESOPHAGOGASTRODUODENOSCOPY (EGD);  Surgeon: Virgel Manifold, MD;  Location: [facility] ENDOSCOPY;  Service: Endoscopy;  Laterality: N/A;  .  ESOPHAGOGASTRODUODENOSCOPY (EGD) WITH PROPOFOL N/A 05/07/2018   Procedure: ESOPHAGOGASTRODUODENOSCOPY (EGD) WITH PROPOFOL;  Surgeon: Lucilla Lame, MD;  Location: [facility] ENDOSCOPY;  Service: Endoscopy;  Laterality: N/A;  . ESOPHAGOGASTRODUODENOSCOPY (EGD) WITH PROPOFOL N/A 04/24/2019   Procedure: ESOPHAGOGASTRODUODENOSCOPY (EGD) WITH PROPOFOL;  Surgeon: Jonathon Bellows, MD;  Location: [facility] ENDOSCOPY;  Service: Gastroenterology;  Laterality: N/A;  . EUS N/A 05/07/2019   Procedure: FULL UPPER ENDOSCOPIC ULTRASOUND (EUS) RADIAL;  Surgeon: Jola Schmidt, MD;  Location: [facility] ENDOSCOPY;  Service: Endoscopy;  Laterality: N/A;  . history of colonoscopy]    . ILEOSCOPY N/A 07/22/2018   Procedure: ILEOSCOPY THROUGH STOMA;  Surgeon: Virgel Manifold, MD;  Location: [facility] ENDOSCOPY;  Service: Endoscopy;  Laterality: N/A;  . ILEOSTOMY    . ILEOSTOMY N/A 09/08/2018   Procedure: ILEOSTOMY REVISION POSSIBLE CREATION;  Surgeon: Herbert Pun, MD;  Location: [facility] ORS;  Service: General;  Laterality: N/A;  . ILEOSTOMY CLOSURE N/A 08/15/2018   Procedure: DILATION OF ILEOSTOMY STRICTURE;  Surgeon: Herbert Pun, MD;  Location: [facility] ORS;  Service: General;  Laterality: N/A;  . LAPAROTOMY Right 05/04/2018   Procedure: EXPLORATORY LAPAROTOMY right colectomy right and left ostomy;  Surgeon: Herbert Pun, MD;  Location: [facility] ORS;  Service: General;  Laterality: Right;  . LUNG BIOPSY    . MASTECTOMY Left    2000, [facility]  . ROTATOR CUFF REPAIR Right    [facility]    Allergies: Patient has no known allergies.  Medications: Prior to Admission medications  Medication Sig Start Date End Date Taking? Authorizing Provider  acetaminophen (TYLENOL) 325 MG tablet Take 2 tablets (650 mg total) by mouth every 6 (six) hours as needed for mild pain (or Fever >/= 101). 12/24/18  Yes Gouru, Illene Silver, MD  albuterol (PROVENTIL HFA;VENTOLIN HFA) 108 (90 Base) MCG/ACT inhaler Inhale 2 puffs into the lungs every 6 (six) hours as  needed for wheezing or shortness of breath. 04/24/18  Yes Demetrios Loll, MD  calcium-vitamin D (OSCAL WITH D) 250-125 MG-UNIT tablet Take 1 tablet by mouth daily.   Yes [provider]  carvedilol (COREG) 3.125 MG tablet Take 3.125 mg by mouth 2 (two) times daily with a meal.   Yes [provider]  citalopram (CELEXA) 40 MG tablet Take 40 mg by mouth daily.   Yes [provider]  ferrous sulfate 325 (65 FE) MG tablet Take 1 tablet (325 mg total) by mouth 2 (two) times daily with a meal. 07/02/18  Yes [name], MD  Fluticasone-Umeclidin-Vilant 100-62.5-25 MCG/INH AEPB Inhale 1 puff into the lungs daily.  01/07/19  Yes [provider]  furosemide (LASIX) 20 MG tablet Take 1 tablet (20 mg total) by mouth daily. And additional 73m dose as needed Patient taking differently: Take 20-40 mg by mouth See admin instructions. Take 1 tablet (253m by mouth daily - may take a second tablet (207mby mouth daily as needed for severe swelling 06/15/19  Yes Hackney, Tina A, FNP  guaiFENesin-dextromethorphan (ROBITUSSIN DM) 100-10 MG/5ML syrup Take 5 mLs by mouth every 4 (four) hours as needed for cough.   Yes [provider]  Multiple Vitamin (MULTIVITAMIN WITH MINERALS) TABS tablet Take 1 tablet by mouth daily. 06/06/18  Yes PatFritzi MandesD  pantoprazole (PROTONIX) 40 MG tablet Take 1 tablet (40 mg total) by mouth 2 (two) times daily before a meal. 07/23/18  Yes Sudini, Srikar, MD  potassium chloride (K-DUR) 10 MEQ tablet Take 10 mEq by mouth daily.    Yes [provider]  sucralfate (CARAFATE) 1 GM/10ML suspension Take 10 mLs (1 g total) by mouth 4 (four) times daily. 06/20/19  Yes Wieting, Richard, MD  loperamide (IMODIUM A-D) 2 MG tablet Take 1 tablet (2 mg total) by mouth as needed for diarrhea or loose stools. 10/04/18   CinHerbert PunD     Family History  Problem Relation Age of Onset  . Breast cancer Mother 69 47 Cancer Mother         Breast   . Cirrhosis Father   . Breast cancer Paternal Aunt 56 51 Cancer Maternal Aunt        Breast     Social History   Socioeconomic History  . Marital status: Divorced    Spouse name: Not on file  . Number of children: 3  . Years of education: Not on file  . Highest education level: Not on file  Occupational History  . 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 Food insecurity    Worry: Often true    Inability: Often true  . Transportation needs    Medical: No    Non-medical: No  Tobacco Use  . Smoking status: Former Smoker    Packs/day: 0.50    Years: 20.00    Pack years: 10.00    Types: Cigarettes    Quit date: 07/02/2012    Years since quitting: 7.0  . Smokeless tobacco: Current User    Types: Snuff  . Tobacco  comment: quit 2014  Substance and Sexual Activity  . Alcohol use: Not Currently    Comment: Occasionally  . Drug use: No  . Sexual activity: Not Currently  Lifestyle  . Physical activity    Days per week: 2 days    Minutes per session: 30 min  . Stress: Not at all  Relationships  . Social connections    Talks on phone: More than three times a week    Gets together: More than three times a week    Attends religious service: 1 to 4 times per year    Active member of club or organization: No    Attends meetings of clubs or organizations: Never    Relationship status: Divorced  Other Topics Concern  . Not on file  Social History Narrative  . Not on file       Review of Systems: A 12 point ROS discussed and pertinent positives are indicated in the HPI above.  All other systems are negative.  Review of Systems  Vital Signs: BP 104/66   Pulse 71   Temp 97.9 F (36.6 C) (Oral)   Resp 18   Ht _0  (1.676 m)   Wt 58.5 kg   SpO2 100%   BMI 20.82 kg/m   Physical Exam General: 63 yo female appearing  stated age.  Well-developed, well-nourished.  No distress. HEENT: Atraumatic, normocephalic.  Conjugate gaze,  extra-ocular motor intact. No scleral icterus or scleral injection. No lesions on external ears, nose, lips, or gums.  Oral mucosa moist, pink.  Neck: Symmetric with no goiter enlargement.  Chest/Lungs:  Symmetric chest with inspiration/expiration.  No labored breathing.  Clear to auscultation with no wheezes, rhonchi, or rales.  Heart:  RRR, with no third heart sounds appreciated. No JVD appreciated.  Abdomen:  Soft, NT/ND, with + bowel sounds.   Genito-urinary: Deferred Neurologic: Alert & Oriented to person, place, and time.   Normal affect and insight.  Appropriate questions.  Moving all 4 extremities with gross sensory intact.    Imaging: Dg Chest 1 View  Result Date: 06/19/2019 CLINICAL DATA:  Shortness of breath. Hypotension. EXAM: CHEST  1 VIEW COMPARISON:  Chest x-ray dated 12/22/2018 FINDINGS: The heart size and pulmonary vascularity are normal. Scarring in the right midzone, unchanged. No infiltrates or effusions. Aortic atherosclerosis. No acute bone abnormality. IMPRESSION: No acute cardiopulmonary disease. Scarring in the right midzone. Aortic atherosclerosis. Electronically Signed   By: Lorriane Shire M.D.   On: 06/19/2019 12:13   Dg Bone Density (dxa)  Result Date: 06/24/2019 EXAM: DUAL X-RAY ABSORPTIOMETRY (DXA) FOR BONE MINERAL DENSITY IMPRESSION: Dear Dr. Daryel Gerald, Your patient ASHRITA CHRISMER completed a FRAX assessment on 06/24/2019 using the [place] (analysis version: 14.10) manufactured by [employer]. The following summarizes the results of our evaluation. PATIENT BIOGRAPHICAL: Name: Shanay, Woolman Patient ID: [id number] Birth Date: 10-09-1956 Height:    66.0 in. Gender:     Female    [age]       Weight:    127.0 lbs. Ethnicity:  Black                            Exam Date: 06/24/2019 FRAX* RESULTS:  (version: 3.5) 10-year Probability of Fracture1 Major Osteoporotic Fracture2 Hip Fracture 5.4% 0.5% Population: [place] (Black) Risk Factors: History of  Fracture (Adult) Based on Femur (Left) Neck BMD 1 -The 10-year probability  of fracture may be lower than reported if the patient has received treatment. 2 -Major Osteoporotic Fracture: Clinical Spine, Forearm, Hip or Shoulder *FRAX is a Materials engineer of the [employer] for Metabolic Bone Disease, a [place] (WHO) [facility]. ASSESSMENT: The probability of a major osteoporotic fracture is 5.4% within the next ten years. The probability of a hip fracture is 0.5% within the next ten years. . Technologist: [employer] PATIENT BIOGRAPHICAL: Name: Vali, Capano Patient ID: [id number] Birth Date: 12-Jan-1956 Height: 66.0 in. Gender: Female Exam Date: 06/24/2019 Weight: 127.0 lbs. Indications: COPD, Height Loss, History of Breast Cancer, History of Fracture (Adult), History of Lung Cancer, Postmenopausal Fractures: Left wrist, Right Wrist Treatments: Multi-Vitamin, Protonix ASSESSMENT: The BMD measured at Femur Total Left is 0.768 g/cm2 with a T-score of -1.9. This patient is considered osteopenic according to [facility]) criteria. Lumbar spine was not utilized due to advanced degenerative changes/scoliosis. The scan quality is good. Site Region Measured Measured WHO Young Adult BMD Date       Age      Classification T-score DualFemur Total Left 06/24/2019 65.2 Osteopenia -1.9 0.768 g/cm2 DualFemur Total Left 02/19/2006 51.8 Normal -0.2 0.984 g/cm2 DualFemur Total Mean 06/24/2019 65.2 Osteopenia -1.8 0.782 g/cm2 DualFemur Total Mean 02/19/2006 51.8 Normal -0.1 0.998 g/cm2 Left Forearm Radius 33% 06/24/2019 65.2 Normal 0.1 0.886 g/cm2 World Health Organization [facility]) criteria for post-menopausal, Caucasian Women: Normal:       T-score at or above -1 SD Osteopenia:   T-score between -1 and -2.5 SD Osteoporosis: T-score at or below -2.5 SD RECOMMENDATIONS: 1. All patients should optimize calcium and vitamin D intake. 2. Consider FDA-approved medical  therapies in postmenopausal women and men aged 59 years and older, based on the following: a. A hip or vertebral(clinical or morphometric) fracture b. T-score < -2.5 at the femoral neck or spine after appropriate evaluation to exclude secondary causes c. Low bone mass (T-score between -1.0 and -2.5 at the femoral neck or spine) and a 10-year probability of a hip fracture > 3% or a 10-year probability of a major osteoporosis-related fracture > 20% based on the [place]-adapted WHO algorithm d. Clinician judgment and/or patient preferences may indicate treatment for people with 10-year fracture probabilities above or below these levels FOLLOW-UP: People with diagnosed cases of osteoporosis or at high risk for fracture should have regular bone mineral density tests. For patients eligible for Medicare, routine testing is allowed once every 2 years. The testing frequency can be increased to one year for patients who have rapidly progressing disease, those who are receiving or discontinuing medical therapy to restore bone mass, or have additional risk factors. I have reviewed this report, and agree with the above findings. [facility] Radiology Electronically Signed   By: Lowella Grip III M.D.   On: 06/24/2019 10:45    Labs:  CBC: Recent Labs    06/19/19 0856 06/19/19 2038 06/20/19 0650 07/01/19 1219 07/08/19 0752  WBC 8.6  --  8.8 9.3 7.0  HGB 6.0* 8.6* 8.8* 10.7* 9.9*  HCT 19.5* 27.1* 27.3* 34.1* 30.6*  PLT 384  --  340 259 276    COAGS: Recent Labs    07/18/18 0001 07/20/18 0327  INR 1.53 1.64  APTT 40* 46*    BMP: Recent Labs    06/12/19 1020 06/19/19 0856 06/20/19 0650 07/01/19 1219  NA 134* 133* 138 138  K 4.7 3.8 3.7 4.1  CL 112* 110 114* 113*  CO2 14* 15*  14* 16*  GLUCOSE 108* 97 88 78  BUN 46* 40* 28* 27*  CALCIUM 9.2 9.3 9.1 9.7  CREATININE 2.44* 1.93* 1.84* 1.62*  GFRNONAA 20* 27* 28* 33*  GFRAA 23* 31* 33* 38*    LIVER FUNCTION TESTS: Recent Labs    05/01/19 0923  06/12/19 1020 06/19/19 0856 07/01/19 1219  BILITOT 0.5 0.4 0.2* 0.8  AST _0 ALT _1 ALKPHOS 100 67 65 72  PROT 8.2* 7.6 7.0 8.1  ALBUMIN 4.0 3.7 3.7 4.0    TUMOR MARKERS: No results for input(s): AFPTM, CEA, CA199, CHROMGRNA in the last 8760 hours.  Assessment and Plan:  Ms Ulrey is a 63 yo female with anemia presenting for a bone marrow biopsy.   Risks and benefits of image guided bone marrow biopsy was discussed with the patient and/or patient's family including, but not limited to bleeding, infection, damage to adjacent structures or low yield requiring additional tests.  All of the questions were answered and there is agreement to proceed.  Consent signed and in chart.  Thank you for this interesting consult.  I greatly enjoyed meeting GISELE PACK and look forward to participating in their care.  A copy of this report was sent to the requesting provider on this date.  Electronically Signed: Corrie Mckusick, DO 07/08/2019, 8:22 AM   I spent a total of  15 Minutes   in face to face in clinical consultation, greater than 50% of which was counseling/coordinating care for anemia and bone marrow biopsy

## 2019-07-08 NOTE — Progress Notes (Signed)
Patient clinically stable post BMB per Dr Earleen Newport.tolerated procedure well,dozing at intervals. Received Versed 3mg  along with Fentanyl 1105mcg iv for procedure. Denies complaints at this time, bandade dressing dry/intact.alert and oriented. Spoke with daughter with questions answered post procedure.

## 2019-07-09 ENCOUNTER — Other Ambulatory Visit: Payer: Self-pay

## 2019-07-13 ENCOUNTER — Inpatient Hospital Stay: Payer: Medicare Other

## 2019-07-13 NOTE — Progress Notes (Signed)
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  Planning radiation treatment.  Past medical history of severe macrocytic anemia, lung cancer no recurrence, breast cancer no recurrence, PVD, CKD stage III-IV. Also DM, CHF, right hemicolectomy with ileostomy, COPD, HTN  Spoke with patient via phone for nutrition assessment.  Patient reports that she has no appetite. Reports that she does not really eat anything until about 5pm.  Then may eat boiled, chicken or canned salmon with mashed potatoes or rice and gravy, turnip greens, pinto beans.  Reports that her top teeth have been pulled so she has to eat soft foods.  Denies trouble swallowing or feeling like foods are getting stuck in esophagus.  "They gave me medicine to help."  Reports that she has tried pedisure in the past and boost.  Reports that she prepares most of meals.  Reports that [employer] sent out frozen meals to patient this past week that she can microwave.    Denies nausea or increased output in ostomy.    Nutrition Focused Physical Exam: deferred   Medications: calcium and vit D, fe sulfate, lasix, MVI, protonix, Kdur, carafate   Labs: reviewed   Anthropometrics:   Height: 66 inches Weight: 129 lb UBW: 134 lb per patient in June 2020 Noted per chart 04/24/2019 136 lb BMI: 20  4% weight loss in the last 2 months, not significant   Estimated Energy Needs  Kcals: 1770-2000 Protein: 89-100 g Fluid: 2 L  NUTRITION DIAGNOSIS: Predicted sub optimal energy intake related to esophageal cancer as evidenced by planning radiation treatment and side effects associated with treatment   INTERVENTION:  Discussed importance of good nutrition with patient and weight maintenance during treatment Discussed chopping, chewing, grinding foods and adding gravies, sauces, etc for ease of swallowing and added  calories.  Encouraged oral nutrition supplements (boost vs pedisure).  Will leave samples for patient to pick up at radiation appointment tomorrow.   Contact information provided to patient.     MONITORING, EVALUATION, GOAL: Patient will consume adequate calories and protein during treatment   Next Visit: phone f/u Monday, October 12th  Danielle [name], [place] Registered Dietitian [phone] (pager)

## 2019-07-14 ENCOUNTER — Ambulatory Visit: Payer: Medicare Other | Admitting: Radiation Oncology

## 2019-07-14 LAB — SURGICAL PATHOLOGY

## 2019-07-15 ENCOUNTER — Encounter (HOSPITAL_COMMUNITY): Payer: Self-pay | Admitting: Internal Medicine

## 2019-07-17 ENCOUNTER — Other Ambulatory Visit: Payer: Self-pay

## 2019-07-20 ENCOUNTER — Other Ambulatory Visit: Payer: Self-pay

## 2019-07-20 ENCOUNTER — Ambulatory Visit
Admission: RE | Admit: 2019-07-20 | Discharge: 2019-07-20 | Disposition: A | Discharge status: Home / Self Care | Payer: Medicare Other | Source: Ambulatory Visit | Attending: Radiation Oncology | Admitting: Radiation Oncology

## 2019-07-20 ENCOUNTER — Encounter: Payer: Self-pay | Admitting: Radiation Oncology

## 2019-07-20 VITALS — BP 96/69 | HR 84 | Temp 97.2°F | Resp 16 | Wt 126.8 lb

## 2019-07-20 DIAGNOSIS — I10 Essential (primary) hypertension: Secondary | ICD-10-CM | POA: Diagnosis not present

## 2019-07-20 DIAGNOSIS — Z803 Family history of malignant neoplasm of breast: Secondary | ICD-10-CM | POA: Diagnosis not present

## 2019-07-20 DIAGNOSIS — Z79899 Other long term (current) drug therapy: Secondary | ICD-10-CM | POA: Diagnosis not present

## 2019-07-20 DIAGNOSIS — R634 Abnormal weight loss: Secondary | ICD-10-CM | POA: Diagnosis not present

## 2019-07-20 DIAGNOSIS — C155 Malignant neoplasm of lower third of esophagus: Secondary | ICD-10-CM | POA: Insufficient documentation

## 2019-07-20 DIAGNOSIS — Z85118 Personal history of other malignant neoplasm of bronchus and lung: Secondary | ICD-10-CM | POA: Insufficient documentation

## 2019-07-20 DIAGNOSIS — Z923 Personal history of irradiation: Secondary | ICD-10-CM | POA: Diagnosis not present

## 2019-07-20 DIAGNOSIS — M129 Arthropathy, unspecified: Secondary | ICD-10-CM | POA: Insufficient documentation

## 2019-07-20 DIAGNOSIS — Z87891 Personal history of nicotine dependence: Secondary | ICD-10-CM | POA: Insufficient documentation

## 2019-07-20 DIAGNOSIS — F419 Anxiety disorder, unspecified: Secondary | ICD-10-CM | POA: Insufficient documentation

## 2019-07-20 DIAGNOSIS — Z853 Personal history of malignant neoplasm of breast: Secondary | ICD-10-CM | POA: Diagnosis not present

## 2019-07-20 DIAGNOSIS — D649 Anemia, unspecified: Secondary | ICD-10-CM | POA: Insufficient documentation

## 2019-07-20 DIAGNOSIS — C3411 Malignant neoplasm of upper lobe, right bronchus or lung: Secondary | ICD-10-CM

## 2019-07-20 DIAGNOSIS — J449 Chronic obstructive pulmonary disease, unspecified: Secondary | ICD-10-CM | POA: Diagnosis not present

## 2019-07-20 NOTE — Consult Note (Signed)
NEW PATIENT EVALUATION  Name: Danielle [name]  MRN: [id number]  Date:   07/20/2019     DOB: Jan 05, 1956   This 63 y.o. 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  REFERRING PHYSICIAN: [facility] [place]:  Chief Complaint  Patient presents with  . Esophageal Cancer    Initial consult    DIAGNOSIS: The encounter diagnosis was Primary cancer of right upper lobe of lung ([place]).   PREVIOUS INVESTIGATIONS:  PET/CT and CT scans reviewed Pathology report reviewed Clinical notes reviewed  HPI: Patient is a 63 year old female well-known to our department having remote history of breast cancer stage IIIa back in 2017 as well as being treated with SBRT to her right upper lobe back in 2016 for stage I non-small cell lung cancer.  She recently presented with anemia was found to have upper GI bleed.  Patient was found to have localized moderate mucosal changes around 31 to 37 cm from the incisors.  By ultrasound there is no sign of tumor in muscularis propria this was staged as a T1N0 lesion 1 lymph node was visualized in the lower paraesophageal mediastinum which was thought to be benign.  PET/CT was performed showing no hypermetabolic activity in the distal esophagus.  Endoscopic ultrasound again was positive for a T1N0 lesion.  She was seen at [facility] for possible Endo mucosal resection although this cannot be done because of extensive disease and recommendation for definitive radiation was made.  Patient has lost about 15 pounds.  She is not having significant dysphasia at this time.  She is now referred to radiation collagen for consideration of treatment.  PLANNED TREATMENT REGIMEN: IMRT radiation therapy  PAST MEDICAL HISTORY:  has a past  medical history of Anxiety, Arthritis, Breast cancer ([place]) (2000), Cancer ([place]) (left ), Cancer of right lung ([place]) (02/21/2016), CHF (congestive heart failure) ([place]), COPD (chronic obstructive pulmonary disease) ([place]), Dependence on supplemental oxygen, Depression, Heart murmur, Hypertension, Lung nodule, Lymphedema, Personal history of chemotherapy, Personal history of radiation therapy, Shortness of breath dyspnea, Status post chemotherapy (2001), and Status post radiation therapy (2001).    PAST SURGICAL HISTORY:  Past Surgical History:  Procedure Laterality Date  . Breast Biospy Left    [facility]  . BREAST SURGERY    . COLONOSCOPY N/A 04/30/2018   Procedure: COLONOSCOPY;  Surgeon: Virgel Manifold, MD;  Location: [facility] ENDOSCOPY;  Service: Endoscopy;  Laterality: N/A;  . COLONOSCOPY N/A 07/22/2018   Procedure: COLONOSCOPY;  Surgeon: Virgel Manifold, MD;  Location: [facility] ENDOSCOPY;  Service: Endoscopy;  Laterality: N/A;  . DILATION AND CURETTAGE OF UTERUS    . ELECTROMAGNETIC NAVIGATION BROCHOSCOPY Right 04/11/2016   Procedure: ELECTROMAGNETIC NAVIGATION BRONCHOSCOPY;  Surgeon: Vilinda Boehringer, MD;  Location: [facility] ORS;  Service: Cardiopulmonary;  Laterality: Right;  . ESOPHAGOGASTRODUODENOSCOPY N/A 07/22/2018   Procedure: ESOPHAGOGASTRODUODENOSCOPY (EGD);  Surgeon: Virgel Manifold, MD;  Location: [facility] ENDOSCOPY;  Service: Endoscopy;  Laterality: N/A;  . ESOPHAGOGASTRODUODENOSCOPY (EGD) WITH PROPOFOL N/A 05/07/2018   Procedure: ESOPHAGOGASTRODUODENOSCOPY (EGD) WITH PROPOFOL;  Surgeon: Lucilla Lame, MD;  Location: [facility] ENDOSCOPY;  Service: Endoscopy;  Laterality: N/A;  . ESOPHAGOGASTRODUODENOSCOPY (EGD) WITH PROPOFOL N/A 04/24/2019   Procedure: ESOPHAGOGASTRODUODENOSCOPY (EGD) WITH PROPOFOL;  Surgeon: Jonathon Bellows, MD;  Location[facility] ENDOSCOPY;  Service: Gastroenterology;  Laterality: N/A;  . EUS N/A 05/07/2019   Procedure: FULL UPPER ENDOSCOPIC ULTRASOUND (EUS) RADIAL;  Surgeon: Jola Schmidt,  MD;  Location: [facility] ENDOSCOPY;  Service: Endoscopy;  Laterality: N/A;  . history of colonoscopy]    . ILEOSCOPY N/A 07/22/2018   Procedure: ILEOSCOPY THROUGH STOMA;  Surgeon: Virgel Manifold, MD;  Location: [facility] ENDOSCOPY;  Service: Endoscopy;  Laterality: N/A;  . ILEOSTOMY    . ILEOSTOMY N/A 09/08/2018   Procedure: ILEOSTOMY REVISION POSSIBLE CREATION;  Surgeon: Herbert Pun, MD;  Location: [facility] ORS;  Service: General;  Laterality: N/A;  . ILEOSTOMY CLOSURE N/A 08/15/2018   Procedure: DILATION OF ILEOSTOMY STRICTURE;  Surgeon: Herbert Pun, MD;  Location: [facility] ORS;  Service: General;  Laterality: N/A;  . LAPAROTOMY Right 05/04/2018   Procedure: EXPLORATORY LAPAROTOMY right colectomy right and left ostomy;  Surgeon: Herbert Pun, MD;  Location: [facility] ORS;  Service: General;  Laterality: Right;  . LUNG BIOPSY    . MASTECTOMY Left    2000, [facility]  . ROTATOR CUFF REPAIR Right    [facility]    FAMILY HISTORY: family history includes Breast cancer (age of onset: 20) in her paternal aunt; Breast cancer (age of onset: 66) in her mother; Cancer in her maternal aunt and mother; Cirrhosis in her father.  SOCIAL HISTORY:  reports that she quit smoking about 7 years ago. Her smoking use included cigarettes. She has a 10.00 pack-year smoking history. Her smokeless tobacco use includes snuff. She reports previous alcohol use. She reports that she does not use drugs.  ALLERGIES: Patient has no known allergies.  MEDICATIONS:  Current Outpatient Medications  Medication Sig Dispense Refill  . acetaminophen (TYLENOL) 325 MG tablet Take 2 tablets (650 mg total) by mouth every 6 (six) hours as needed for mild pain (or Fever >/= 101).    [name] albuterol (PROVENTIL HFA;VENTOLIN HFA) 108 (90 Base) MCG/ACT inhaler Inhale 2 puffs into the lungs every 6 (six) hours as needed for wheezing or shortness of breath. 1 Inhaler 2  . calcium-vitamin D (OSCAL WITH D) 250-125 MG-UNIT tablet Take 1 tablet by  mouth daily.    . carvedilol (COREG) 3.125 MG tablet Take 3.125 mg by mouth 2 (two) times daily with a meal.    . citalopram (CELEXA) 40 MG tablet Take 40 mg by mouth daily.    . ferrous sulfate 325 (65 FE) MG tablet Take 1 tablet (325 mg total) by mouth 2 (two) times daily with a meal. 60 tablet 3  . Fluticasone-Umeclidin-Vilant 100-62.5-25 MCG/INH AEPB Inhale 1 puff into the lungs daily.     . furosemide (LASIX) 20 MG tablet Take 1 tablet (20 mg total) by mouth daily. And additional 20mg  dose as needed (Patient taking differently: Take 20-40 mg by mouth See admin instructions. Take 1 tablet (20mg ) by mouth daily - may take a second tablet (20mg ) by mouth daily as needed for severe swelling) 40 tablet 5  . guaiFENesin-dextromethorphan (ROBITUSSIN DM) 100-10 MG/5ML syrup Take 5 mLs by mouth every 4 (four) hours as needed for cough.    . loperamide (IMODIUM A-D) 2 MG tablet Take 1 tablet (2 mg total) by mouth as needed for diarrhea or loose stools. 30 tablet 0  . Multiple Vitamin (MULTIVITAMIN WITH MINERALS) TABS tablet Take 1 tablet by mouth daily. 30 tablet 1  . pantoprazole (PROTONIX) 40 MG tablet Take 1 tablet (40 mg total) by mouth 2 (two) times daily before a meal. 60 tablet 0  . potassium chloride (K-DUR) 10 MEQ tablet  Take 10 mEq by mouth daily.     . sucralfate (CARAFATE) 1 GM/10ML suspension Take 10 mLs (1 g total) by mouth 4 (four) times daily. 420 mL 0   No current facility-administered medications for this encounter.     ECOG PERFORMANCE STATUS:  0 - Asymptomatic  REVIEW OF SYSTEMS: Patient denies any weight loss, fatigue, weakness, fever, chills or night sweats. Patient denies any loss of vision, blurred vision. Patient denies any ringing  of the ears or hearing loss. No irregular heartbeat. Patient denies heart murmur or history of fainting. Patient denies any chest pain or pain radiating to her upper extremities. Patient denies any shortness of breath, difficulty breathing at night,  cough or hemoptysis. Patient denies any swelling in the lower legs. Patient denies any nausea vomiting, vomiting of blood, or coffee ground material in the vomitus. Patient denies any stomach pain. Patient states has had normal bowel movements no significant constipation or diarrhea. Patient denies any dysuria, hematuria or significant nocturia. Patient denies any problems walking, swelling in the joints or loss of balance. Patient denies any skin changes, loss of hair or loss of weight. Patient denies any excessive worrying or anxiety or significant depression. Patient denies any problems with insomnia. Patient denies excessive thirst, polyuria, polydipsia. Patient denies any swollen glands, patient denies easy bruising or easy bleeding. Patient denies any recent infections, allergies or URI. Patient "s visual fields have not changed significantly in recent time.   PHYSICAL EXAM: BP 96/69 (BP Location: Right Arm, Patient Position: Sitting)   Pulse 84   Temp (!) 97.2 F (36.2 C) (Tympanic)   Resp 16   Wt 126 lb 12.8 oz (57.5 kg)   BMI 20.47 kg/m  Well-developed well-nourished patient in NAD. HEENT reveals PERLA, EOMI, discs not visualized.  Oral cavity is clear. No oral mucosal lesions are identified. Neck is clear without evidence of cervical or supraclavicular adenopathy. Lungs are clear to A&P. Cardiac examination is essentially unremarkable with regular rate and rhythm without murmur rub or thrill. Abdomen is benign with no organomegaly or masses noted. Motor sensory and DTR levels are equal and symmetric in the upper and lower extremities. Cranial nerves II through XII are grossly intact. Proprioception is intact. No peripheral adenopathy or edema is identified. No motor or sensory levels are noted. Crude visual fields are within normal range.  LABORATORY DATA: Pathology report reviewed    RADIOLOGY RESULTS: CT scan and PET CT scan reviewed   IMPRESSION: Stage I (T1 N0 M0) squamous cell  carcinoma the distal esophagus in 63 year old here female with history of prior radiation for breast cancer as well as lung cancer.  PLAN: At this time elected ahead with IMRT radiation therapy.  I would treat up to 5400 cGy to the area of the proximal esophagus.  I would choose IMRT based on previous breast radiation as well as lung radiation to spare critical structures as previously radiated treatment fields lung heart and spinal cord.  Risks and benefits of treatment including increased dysphasia skin reaction fatigue alteration of blood counts all were discussed in detail with the patient.  Patient will not receive chemotherapy.  I have personally set up and ordered CT simulation for later this week.  Patient comprehends my treatment plan well.  I would like to take this opportunity to thank you for allowing me to participate in the care of your patient.Noreene Filbert, MD

## 2019-07-21 ENCOUNTER — Encounter: Payer: Self-pay | Admitting: Internal Medicine

## 2019-07-21 ENCOUNTER — Inpatient Hospital Stay: Payer: Medicare Other | Attending: Internal Medicine

## 2019-07-21 ENCOUNTER — Other Ambulatory Visit: Payer: Self-pay

## 2019-07-21 ENCOUNTER — Inpatient Hospital Stay (HOSPITAL_BASED_OUTPATIENT_CLINIC_OR_DEPARTMENT_OTHER): Payer: Medicare Other | Admitting: Internal Medicine

## 2019-07-21 DIAGNOSIS — Z85118 Personal history of other malignant neoplasm of bronchus and lung: Secondary | ICD-10-CM | POA: Diagnosis not present

## 2019-07-21 DIAGNOSIS — Z87891 Personal history of nicotine dependence: Secondary | ICD-10-CM | POA: Insufficient documentation

## 2019-07-21 DIAGNOSIS — C50811 Malignant neoplasm of overlapping sites of right female breast: Secondary | ICD-10-CM | POA: Insufficient documentation

## 2019-07-21 DIAGNOSIS — Z79899 Other long term (current) drug therapy: Secondary | ICD-10-CM | POA: Insufficient documentation

## 2019-07-21 DIAGNOSIS — I10 Essential (primary) hypertension: Secondary | ICD-10-CM | POA: Insufficient documentation

## 2019-07-21 DIAGNOSIS — D631 Anemia in chronic kidney disease: Secondary | ICD-10-CM

## 2019-07-21 DIAGNOSIS — D649 Anemia, unspecified: Secondary | ICD-10-CM | POA: Diagnosis not present

## 2019-07-21 DIAGNOSIS — Z170 Estrogen receptor positive status [ER+]: Secondary | ICD-10-CM | POA: Diagnosis not present

## 2019-07-21 DIAGNOSIS — I509 Heart failure, unspecified: Secondary | ICD-10-CM | POA: Insufficient documentation

## 2019-07-21 DIAGNOSIS — Z992 Dependence on renal dialysis: Secondary | ICD-10-CM | POA: Diagnosis not present

## 2019-07-21 DIAGNOSIS — N183 Chronic kidney disease, stage 3 unspecified: Secondary | ICD-10-CM | POA: Insufficient documentation

## 2019-07-21 DIAGNOSIS — R634 Abnormal weight loss: Secondary | ICD-10-CM | POA: Insufficient documentation

## 2019-07-21 DIAGNOSIS — Z79811 Long term (current) use of aromatase inhibitors: Secondary | ICD-10-CM | POA: Diagnosis not present

## 2019-07-21 DIAGNOSIS — J449 Chronic obstructive pulmonary disease, unspecified: Secondary | ICD-10-CM | POA: Insufficient documentation

## 2019-07-21 DIAGNOSIS — I739 Peripheral vascular disease, unspecified: Secondary | ICD-10-CM | POA: Insufficient documentation

## 2019-07-21 DIAGNOSIS — Z923 Personal history of irradiation: Secondary | ICD-10-CM | POA: Diagnosis not present

## 2019-07-21 DIAGNOSIS — F418 Other specified anxiety disorders: Secondary | ICD-10-CM | POA: Insufficient documentation

## 2019-07-21 DIAGNOSIS — I130 Hypertensive heart and chronic kidney disease with heart failure and stage 1 through stage 4 chronic kidney disease, or unspecified chronic kidney disease: Secondary | ICD-10-CM | POA: Diagnosis not present

## 2019-07-21 DIAGNOSIS — C155 Malignant neoplasm of lower third of esophagus: Secondary | ICD-10-CM

## 2019-07-21 DIAGNOSIS — Z9221 Personal history of antineoplastic chemotherapy: Secondary | ICD-10-CM | POA: Diagnosis not present

## 2019-07-21 LAB — CBC WITH DIFFERENTIAL/PLATELET
Abs Immature Granulocytes: 0.02 10*3/uL (ref 0.00–0.07)
Basophils Absolute: 0.1 10*3/uL (ref 0.0–0.1)
Basophils Relative: 1 %
Eosinophils Absolute: 0.5 10*3/uL (ref 0.0–0.5)
Eosinophils Relative: 8 %
HCT: 34.5 % — ABNORMAL LOW (ref 36.0–46.0)
Hemoglobin: 10.7 g/dL — ABNORMAL LOW (ref 12.0–15.0)
Immature Granulocytes: 0 %
Lymphocytes Relative: 27 %
Lymphs Abs: 1.7 10*3/uL (ref 0.7–4.0)
MCH: 30.9 pg (ref 26.0–34.0)
MCHC: 31 g/dL (ref 30.0–36.0)
MCV: 99.7 fL (ref 80.0–100.0)
Monocytes Absolute: 0.6 10*3/uL (ref 0.1–1.0)
Monocytes Relative: 9 %
Neutro Abs: 3.6 10*3/uL (ref 1.7–7.7)
Neutrophils Relative %: 55 %
Platelets: 203 10*3/uL (ref 150–400)
RBC: 3.46 MIL/uL — ABNORMAL LOW (ref 3.87–5.11)
RDW: 16.6 % — ABNORMAL HIGH (ref 11.5–15.5)
WBC: 6.4 10*3/uL (ref 4.0–10.5)
nRBC: 0.3 % — ABNORMAL HIGH (ref 0.0–0.2)

## 2019-07-21 LAB — SAMPLE TO BLOOD BANK

## 2019-07-21 NOTE — Progress Notes (Signed)
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 A. LUNG MASS, RIGHT; CT-GUIDED BIOPSY: - ADENOCARCINOMA, ACINAR AND PAPILLARY MORPHOLOGIES. S/p RT [finished Dec 2017] s/p RT [DEC 2017]; AUG 2019- PET-right middle lobe radiation changes no evidence of recurrence.  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  T1b- submucosal involvement; N0- stage I [[name]; [facility]]; unable to proceed with Endo mucosal resection-because of extensive disease [Dr.Branch; [facility]]; recommend definitive radiation  #September 2020 worsening macrocytic anemia--bone marrow biopsy shows plasma cells up to 5%; unlikely to explain patient's anemia.  MDS is still a possibility-if anemia worsens would recommend peripheral blood NGS.   # 2000-  Left breast cancer [T3N1- stage III] mastecomy s/p RT; s/p chemo; Tamoxifen  # NOV 111 2017- Mol testing [RML- adeno ca]  # 2019-July-aug 2019-ischemic colitis status post right hemicolectomy [Dr.[name]]; acute renal failure;   -----------------------------------------------------------------    DIAGNOSIS: RML Lung ca adeno [stage I] #squamous cell carcinoma esophagus [question stage]   CURRENT/MOST RECENT THERAPY: surveillance       Breast cancer in female [facility])  07/24/1999 Initial Diagnosis   Breast cancer in female ([place]) T3 N1 M0 tumor ER/PR positive   08/24/1999 -  Anti-estrogen oral therapy   Started tamoxifen   08/24/1999 Surgery   Status post modified radical mastectomy of left breast    Chemotherapy      Radiation Therapy     11/24/2003 - 02/20/2009 Anti-estrogen oral therapy   Started Aromasin   Carcinoma of overlapping sites of left breast in female, estrogen receptor positive ([place])  06/22/2016 Initial Diagnosis   Cancer of overlapping sites of left female breast ([place])   Malignant neoplasm of upper lobe of right lung ([place])  Malignant neoplasm of lower third of esophagus ([place])  05/01/2019 Initial Diagnosis   Malignant neoplasm of lower third of esophagus ([place])     INTERVAL HISTORY:  Danielle [name] 63 y.o.  female pleasant patient above history of lung cancer stage I status post SBRT 2017; and also a remote history of breast cancer is here for follow-up; with new diagnosis of stage I squamous cell esophagus cancer.  In the interim patient had a bone marrow biopsy for worsening anemia.  She is awaiting radiation simulation end of this week; plan to start radiation on October 19 for her stage I esophagus cancer.   Continues to have intermittent difficulty swallowing.  Status post evaluation with nutrition.  Denies any blood in stools or black or stools.  Patient has chronic shortness of breath chronic cough not any worse.  No swelling in the legs.    Review of Systems  Constitutional: Positive for  malaise/fatigue and weight loss. Negative for chills, diaphoresis and fever.  HENT: Negative for nosebleeds and sore throat.   Eyes: Negative for double vision.  Respiratory: Positive for  cough and shortness of breath. Negative for hemoptysis, sputum production and wheezing.   Cardiovascular: Negative for chest pain, palpitations, orthopnea and leg swelling.  Gastrointestinal: Negative for abdominal pain, blood in stool, constipation, diarrhea, heartburn, melena, nausea and vomiting.       Dysphagia.  Genitourinary: Negative for dysuria, frequency and urgency.  Musculoskeletal: Negative for back pain and joint pain.  Skin: Negative.  Negative for itching and rash.  Neurological: Negative for dizziness, tingling, focal weakness, weakness and headaches.  Endo/Heme/Allergies: Does not bruise/bleed easily.  Psychiatric/Behavioral: Negative for depression. The patient is not nervous/anxious and does not have insomnia.       PAST MEDICAL HISTORY :  Past Medical History:  Diagnosis Date  . Anxiety   . Arthritis   . Breast cancer ([place]) 2000  . Cancer ([place]) left    breast cancer 2000, chemo tx's with total mastectomy and lymph nodes resected.   . Cancer of right lung ([place]) 02/21/2016   rad tx's.   . CHF (congestive heart failure) ([place])   . COPD (chronic obstructive pulmonary disease) ([place])   . Dependence on supplemental oxygen   . Depression   . Heart murmur   . Hypertension   . Lung nodule   . Lymphedema   . Personal history of chemotherapy   . Personal history of radiation therapy   . Shortness of breath dyspnea    with exertion  . Status post chemotherapy 2001   left breast cancer  . Status post radiation therapy 2001   left breast cancer    PAST SURGICAL HISTORY :   Past Surgical History:  Procedure Laterality Date  . Breast Biospy Left    [facility]  . BREAST SURGERY    . COLONOSCOPY N/A 04/30/2018   Procedure: COLONOSCOPY;  Surgeon: Virgel Manifold, MD;  Location: [facility] ENDOSCOPY;  Service: Endoscopy;  Laterality: N/A;  . COLONOSCOPY N/A 07/22/2018   Procedure: COLONOSCOPY;  Surgeon: Virgel Manifold, MD;  Location: [facility] ENDOSCOPY;  Service: Endoscopy;   Laterality: N/A;  . DILATION AND CURETTAGE OF UTERUS    . ELECTROMAGNETIC NAVIGATION BROCHOSCOPY Right 04/11/2016   Procedure: ELECTROMAGNETIC NAVIGATION BRONCHOSCOPY;  Surgeon: Vilinda Boehringer, MD;  Location: [facility] ORS;  Service: Cardiopulmonary;  Laterality: Right;  . ESOPHAGOGASTRODUODENOSCOPY N/A 07/22/2018   Procedure: ESOPHAGOGASTRODUODENOSCOPY (EGD);  Surgeon: Virgel Manifold, MD;  Location[facility] ENDOSCOPY;  Service: Endoscopy;  Laterality: N/A;  . ESOPHAGOGASTRODUODENOSCOPY (EGD) WITH PROPOFOL N/A 05/07/2018   Procedure: ESOPHAGOGASTRODUODENOSCOPY (EGD) WITH PROPOFOL;  Surgeon: Lucilla Lame, MD;  Location[facility] ENDOSCOPY;  Service: Endoscopy;  Laterality: N/A;  . ESOPHAGOGASTRODUODENOSCOPY (EGD) WITH PROPOFOL N/A 04/24/2019   Procedure: ESOPHAGOGASTRODUODENOSCOPY (EGD) WITH PROPOFOL;  Surgeon: Jonathon Bellows, MD;  Location: [facility] ENDOSCOPY;  Service: Gastroenterology;  Laterality: N/A;  . EUS N/A 05/07/2019   Procedure: FULL UPPER ENDOSCOPIC ULTRASOUND (EUS) RADIAL;  Surgeon: Jola Schmidt, MD;  Location: [facility] ENDOSCOPY;  Service: Endoscopy;  Laterality: N/A;  . history of colonoscopy]    . ILEOSCOPY N/A 07/22/2018   Procedure: ILEOSCOPY THROUGH STOMA;  Surgeon: Virgel Manifold, MD;  Location: [facility] ENDOSCOPY;  Service: Endoscopy;  Laterality: N/A;  . ILEOSTOMY    . ILEOSTOMY N/A 09/08/2018   Procedure: ILEOSTOMY REVISION POSSIBLE CREATION;  Surgeon: Herbert Pun, MD;  Location: [facility] ORS;  Service: General;  Laterality: N/A;  . ILEOSTOMY CLOSURE N/A  08/15/2018   Procedure: DILATION OF ILEOSTOMY STRICTURE;  Surgeon: Herbert Pun, MD;  Location: [facility] ORS;  Service: General;  Laterality: N/A;  . LAPAROTOMY Right 05/04/2018   Procedure: EXPLORATORY LAPAROTOMY right colectomy right and left ostomy;  Surgeon: Herbert Pun, MD;  Location: [facility] ORS;  Service: General;  Laterality: Right;  . LUNG BIOPSY    . MASTECTOMY Left    2000, [facility]  . ROTATOR CUFF REPAIR Right     [facility]    FAMILY HISTORY :   Family History  Problem Relation Age of Onset  . Breast cancer Mother 22  . Cancer Mother        Breast   . Cirrhosis Father   . Breast cancer Paternal Aunt 92  . Cancer Maternal Aunt        Breast     SOCIAL HISTORY:   Social History   Tobacco Use  . Smoking status: Former Smoker    Packs/day: 0.50    Years: 20.00    Pack years: 10.00    Types: Cigarettes    Quit date: 07/02/2012    Years since quitting: 7.0  . Smokeless tobacco: Current User    Types: Snuff  . Tobacco comment: quit 2014  Substance Use Topics  . Alcohol use: Not Currently    Comment: Occasionally  . Drug use: No    ALLERGIES:  has No Known Allergies.  MEDICATIONS:  Current Outpatient Medications  Medication Sig Dispense Refill  . acetaminophen (TYLENOL) 325 MG tablet Take 2 tablets (650 mg total) by mouth every 6 (six) hours as needed for mild pain (or Fever >/= 101).    [name] albuterol (PROVENTIL HFA;VENTOLIN HFA) 108 (90 Base) MCG/ACT inhaler Inhale 2 puffs into the lungs every 6 (six) hours as needed for wheezing or shortness of breath. 1 Inhaler 2  . calcium-vitamin D (OSCAL WITH D) 250-125 MG-UNIT tablet Take 1 tablet by mouth daily.    . carvedilol (COREG) 3.125 MG tablet Take 3.125 mg by mouth 2 (two) times daily with a meal.    . citalopram (CELEXA) 40 MG tablet Take 40 mg by mouth daily.    . ferrous sulfate 325 (65 FE) MG tablet Take 1 tablet (325 mg total) by mouth 2 (two) times daily with a meal. 60 tablet 3  . Fluticasone-Umeclidin-Vilant 100-62.5-25 MCG/INH AEPB Inhale 1 puff into the lungs daily.     . furosemide (LASIX) 20 MG tablet Take 1 tablet (20 mg total) by mouth daily. And additional '20mg'$  dose as needed (Patient taking differently: Take 20-40 mg by mouth See admin instructions. Take 1 tablet ('20mg'$ ) by mouth daily - may take a second tablet ('20mg'$ ) by mouth daily as needed for severe swelling) 40 tablet 5  . guaiFENesin-dextromethorphan (ROBITUSSIN DM) 100-10  MG/5ML syrup Take 5 mLs by mouth every 4 (four) hours as needed for cough.    . loperamide (IMODIUM A-D) 2 MG tablet Take 1 tablet (2 mg total) by mouth as needed for diarrhea or loose stools. 30 tablet 0  . Multiple Vitamin (MULTIVITAMIN WITH MINERALS) TABS tablet Take 1 tablet by mouth daily. 30 tablet 1  . pantoprazole (PROTONIX) 40 MG tablet Take 1 tablet (40 mg total) by mouth 2 (two) times daily before a meal. 60 tablet 0  . potassium chloride (K-DUR) 10 MEQ tablet Take 10 mEq by mouth daily.     . sucralfate (CARAFATE) 1 GM/10ML suspension Take 10 mLs (1 g total) by mouth 4 (four) times daily. 420 mL 0  No current facility-administered medications for this visit.     PHYSICAL EXAMINATION: ECOG PERFORMANCE STATUS: 1 - Symptomatic but completely ambulatory  BP 97/68 (BP Location: Left Arm, Patient Position: Sitting, Cuff Size: Small)   Pulse 81   Temp (!) 97.3 F (36.3 C) (Tympanic)   Resp 18   Wt 127 lb 12.8 oz (58 kg)   SpO2 99%   BMI 20.63 kg/m   Filed Weights   07/21/19 1105  Weight: 127 lb 12.8 oz (58 kg)    Physical Exam  Constitutional: She is oriented to person, place, and time.  Thin built moderately nourished female patient.  She is walking herself.  She is alone.  HENT:  Head: Normocephalic and atraumatic.  Mouth/Throat: Oropharynx is clear and moist. No oropharyngeal exudate.  Eyes: Pupils are equal, round, and reactive to light.  Neck: Normal range of motion. Neck supple.  Cardiovascular: Normal rate and regular rhythm.  Pulmonary/Chest: No respiratory distress. She has no wheezes.  Decreased breath sounds bilaterally.  No wheeze or crackles.  Abdominal: Soft. Bowel sounds are normal. She exhibits no distension and no mass. There is no abdominal tenderness. There is no rebound and no guarding.  Musculoskeletal: Normal range of motion.        General: No tenderness or edema.  Neurological: She is alert and oriented to person, place, and time.  Skin: Skin  is warm.  Psychiatric: Affect normal.       LABORATORY DATA:  I have reviewed the data as listed    Component Value Date/Time   NA 138 07/01/2019 1219   NA 132 (L) 02/09/2015 1100   K 4.1 07/01/2019 1219   K 3.8 02/09/2015 1100   CL 113 (H) 07/01/2019 1219   CL 95 (L) 02/09/2015 1100   CO2 16 (L) 07/01/2019 1219   CO2 29 02/09/2015 1100   GLUCOSE 78 07/01/2019 1219   GLUCOSE 105 (H) 02/09/2015 1100   BUN 27 (H) 07/01/2019 1219   BUN 16 02/09/2015 1100   CREATININE 1.62 (H) 07/01/2019 1219   CREATININE 0.81 02/09/2015 1100   CALCIUM 9.7 07/01/2019 1219   CALCIUM 9.1 02/09/2015 1100   PROT 8.1 07/01/2019 1219   PROT 7.7 02/09/2015 1100   ALBUMIN 4.0 07/01/2019 1219   ALBUMIN 4.3 02/09/2015 1100   AST 26 07/01/2019 1219   AST 29 02/09/2015 1100   ALT 23 07/01/2019 1219   ALT 20 02/09/2015 1100   ALKPHOS 72 07/01/2019 1219   ALKPHOS 69 02/09/2015 1100   BILITOT 0.8 07/01/2019 1219   BILITOT 0.9 02/09/2015 1100   GFRNONAA 33 (L) 07/01/2019 1219   GFRNONAA >60 02/09/2015 1100   GFRAA 38 (L) 07/01/2019 1219   GFRAA >60 02/09/2015 1100    No results found for: SPEP, UPEP  Lab Results  Component Value Date   WBC 6.4 07/21/2019   NEUTROABS 3.6 07/21/2019   HGB 10.7 (L) 07/21/2019   HCT 34.5 (L) 07/21/2019   MCV 99.7 07/21/2019   PLT 203 07/21/2019      Chemistry      Component Value Date/Time   NA 138 07/01/2019 1219   NA 132 (L) 02/09/2015 1100   K 4.1 07/01/2019 1219   K 3.8 02/09/2015 1100   CL 113 (H) 07/01/2019 1219   CL 95 (L) 02/09/2015 1100   CO2 16 (L) 07/01/2019 1219   CO2 29 02/09/2015 1100   BUN 27 (H) 07/01/2019 1219   BUN 16 02/09/2015 1100   CREATININE 1.62 (H) 07/01/2019  1219   CREATININE 0.81 02/09/2015 1100      Component Value Date/Time   CALCIUM 9.7 07/01/2019 1219   CALCIUM 9.1 02/09/2015 1100   ALKPHOS 72 07/01/2019 1219   ALKPHOS 69 02/09/2015 1100   AST 26 07/01/2019 1219   AST 29 02/09/2015 1100   ALT 23 07/01/2019 1219    ALT 20 02/09/2015 1100   BILITOT 0.8 07/01/2019 1219   BILITOT 0.9 02/09/2015 1100       RADIOGRAPHIC STUDIES: I have personally reviewed the radiological images as listed and agreed with the findings in the report. No results found.   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  Patient not a candidate for the mucosal resection; hence proceed with definitive radiation.  #Anemia mildly macrocytic-bone marrow biopsy shows up to 5% plasma cells/lambda predominant.  It is clinically unlikely low quantity of plasma cells causing her anemia..  Hemoglobin is 10.7; especially given above esophagus cancer need for radiation; I think it is reasonable to hold off any further work-up for myeloma at this time.  Also the recent PET scan was negative for any bony lesions.  Will monitor hemoglobin closely.  If continues to drop; would recommend trial of 24 hours light urine collection for light chains; also peripheral blood NGS-for MDS.  #Weight loss dysphagia-secondary to esophagus cancer-status post evaluation with nutrition.  Recommended boost/Ensure.  If worsening weight loss would recommend-PEG tube placement.  #Peripheral vascular disease- OFF eliquis given anemia ; hold off for now.  #Chronic renal insufficiency-stage III-IV-stable.  # I offered to call the patient's family/daughter twice, to give an update on clinical status; patient declines.  She was given a copy of her bone marrow biopsy report.  # DISPOSITION: # follow up in 4 weeks MD- lab- cbc/bmp-hold tube- Dr.B     Orders Placed This Encounter  Procedures  . CBC with Differential    Standing Status:   Future    Standing Expiration Date:   07/20/2020  . Basic metabolic panel    Standing Status:   Future    Standing Expiration Date:   07/20/2020  . Hold Tube- Blood Bank    Standing Status:   Future    Standing Expiration Date:    07/20/2020   All questions were answered. The patient knows to call the clinic with any problems, questions or concerns.      [name], MD 07/21/2019 11:46 AM

## 2019-07-21 NOTE — Assessment & Plan Note (Addendum)
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  Patient not a candidate for the mucosal resection; hence proceed with definitive radiation.  #Anemia mildly macrocytic-bone marrow biopsy shows up to 5% plasma cells/lambda predominant.  It is clinically unlikely low quantity of plasma cells causing her anemia..  Hemoglobin is 10.7; especially given above esophagus cancer need for radiation; I think it is reasonable to hold off any further work-up for myeloma at this time.  Also the recent PET scan was negative for any bony lesions.  Will monitor hemoglobin closely.  If continues to drop; would recommend trial of 24 hours light urine collection for light chains; also peripheral blood NGS-for MDS.  #Weight loss dysphagia-secondary to esophagus cancer-status post evaluation with nutrition.  Recommended boost/Ensure.  If worsening weight loss would recommend-PEG tube placement.  #Peripheral vascular disease- OFF eliquis given anemia ; hold off for now.  #Chronic renal insufficiency-stage III-IV-stable.  # I offered to call the patient's family/daughter twice, to give an update on clinical status; patient declines.  She was given a copy of her bone marrow biopsy report.  # DISPOSITION: # follow up in 4 weeks MD- lab- cbc/bmp-hold tube- Dr.B

## 2019-07-22 ENCOUNTER — Encounter (HOSPITAL_COMMUNITY): Payer: Self-pay | Admitting: Internal Medicine

## 2019-07-23 ENCOUNTER — Ambulatory Visit: Payer: Medicare Other

## 2019-07-24 ENCOUNTER — Other Ambulatory Visit: Payer: Self-pay

## 2019-07-24 ENCOUNTER — Ambulatory Visit
Admission: RE | Admit: 2019-07-24 | Discharge: 2019-07-24 | Disposition: A | Discharge status: Home / Self Care | Payer: Medicare Other | Source: Ambulatory Visit | Attending: Radiation Oncology | Admitting: Radiation Oncology

## 2019-07-24 DIAGNOSIS — C155 Malignant neoplasm of lower third of esophagus: Secondary | ICD-10-CM | POA: Insufficient documentation

## 2019-07-24 DIAGNOSIS — Z510 Encounter for antineoplastic radiation therapy: Secondary | ICD-10-CM | POA: Insufficient documentation

## 2019-07-27 ENCOUNTER — Inpatient Hospital Stay: Payer: Medicare Other

## 2019-07-27 DIAGNOSIS — C155 Malignant neoplasm of lower third of esophagus: Secondary | ICD-10-CM | POA: Diagnosis not present

## 2019-07-27 NOTE — Progress Notes (Signed)
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  Patient to begin radiation treatment on 10/19.    Spoke with patient via phone today.  Patient reports that she feels like her appetite is better. Reports that she has tried to eat 2 meals per day vs just 1.  Eating eggs in the am and drinking boost plus.  Also reports snacking on peanut butter crackers and eating evening meal (meat and vegetables).  Sometimes is able to drink boost in the afternoon or at night.  Reports that she likes to snack on jello, sometimes ice cream sandwiches and pudding.      Medications: reviewed  Labs: reviewed  Anthropometrics:   Weight 127 lb 12.8 oz on 10/6 slight decrease from 129 lb.     NUTRITION DIAGNOSIS:  Predicted sub optimal energy intake continues   INTERVENTION:  Encouraged patient to continue boost plus shakes.  Likes boost better than ensure.  Will mail coupons.   Reviewed higher calorie foods to snack on vs jello.   Patient has contact information    MONITORING, EVALUATION, GOAL: Patient will consume adequate calories and protein to prevent weight loss during treatment   NEXT VISIT: November 2nd after radiation. Discussed with patient  Omnia Dollinger B. Zenia Resides, [place], [place] Registered Dietitian [phone] (pager)

## 2019-07-31 ENCOUNTER — Other Ambulatory Visit: Payer: Self-pay | Admitting: *Deleted

## 2019-07-31 DIAGNOSIS — C155 Malignant neoplasm of lower third of esophagus: Secondary | ICD-10-CM

## 2019-07-31 DIAGNOSIS — C3411 Malignant neoplasm of upper lobe, right bronchus or lung: Secondary | ICD-10-CM

## 2019-08-03 ENCOUNTER — Ambulatory Visit
Admission: RE | Admit: 2019-08-03 | Discharge: 2019-08-03 | Disposition: A | Discharge status: Home / Self Care | Payer: Medicare Other | Source: Ambulatory Visit | Attending: Radiation Oncology | Admitting: Radiation Oncology

## 2019-08-03 ENCOUNTER — Inpatient Hospital Stay: Payer: Medicare Other

## 2019-08-03 ENCOUNTER — Other Ambulatory Visit: Payer: Self-pay

## 2019-08-04 ENCOUNTER — Ambulatory Visit
Admission: RE | Admit: 2019-08-04 | Discharge: 2019-08-04 | Disposition: A | Discharge status: Home / Self Care | Payer: Medicare Other | Source: Ambulatory Visit | Attending: Radiation Oncology | Admitting: Radiation Oncology

## 2019-08-04 ENCOUNTER — Inpatient Hospital Stay: Payer: Medicare Other

## 2019-08-04 ENCOUNTER — Other Ambulatory Visit: Payer: Self-pay

## 2019-08-04 DIAGNOSIS — C155 Malignant neoplasm of lower third of esophagus: Secondary | ICD-10-CM | POA: Diagnosis not present

## 2019-08-05 ENCOUNTER — Inpatient Hospital Stay: Payer: Medicare Other

## 2019-08-05 ENCOUNTER — Ambulatory Visit
Admission: RE | Admit: 2019-08-05 | Discharge: 2019-08-05 | Disposition: A | Discharge status: Home / Self Care | Payer: Medicare Other | Source: Ambulatory Visit | Attending: Radiation Oncology | Admitting: Radiation Oncology

## 2019-08-05 ENCOUNTER — Other Ambulatory Visit: Payer: Self-pay

## 2019-08-05 DIAGNOSIS — C155 Malignant neoplasm of lower third of esophagus: Secondary | ICD-10-CM | POA: Diagnosis not present

## 2019-08-06 ENCOUNTER — Other Ambulatory Visit: Payer: Self-pay

## 2019-08-06 ENCOUNTER — Ambulatory Visit
Admission: RE | Admit: 2019-08-06 | Discharge: 2019-08-06 | Disposition: A | Discharge status: Home / Self Care | Payer: Medicare Other | Source: Ambulatory Visit | Attending: Radiation Oncology | Admitting: Radiation Oncology

## 2019-08-06 ENCOUNTER — Inpatient Hospital Stay: Payer: Medicare Other

## 2019-08-06 DIAGNOSIS — C155 Malignant neoplasm of lower third of esophagus: Secondary | ICD-10-CM | POA: Diagnosis not present

## 2019-08-07 ENCOUNTER — Ambulatory Visit
Admission: RE | Admit: 2019-08-07 | Discharge: 2019-08-07 | Disposition: A | Discharge status: Home / Self Care | Payer: Medicare Other | Source: Ambulatory Visit | Attending: Radiation Oncology | Admitting: Radiation Oncology

## 2019-08-07 ENCOUNTER — Inpatient Hospital Stay: Payer: Medicare Other

## 2019-08-07 ENCOUNTER — Other Ambulatory Visit: Payer: Self-pay

## 2019-08-07 DIAGNOSIS — C155 Malignant neoplasm of lower third of esophagus: Secondary | ICD-10-CM | POA: Diagnosis not present

## 2019-08-10 ENCOUNTER — Ambulatory Visit
Admission: RE | Admit: 2019-08-10 | Discharge: 2019-08-10 | Disposition: A | Discharge status: Home / Self Care | Payer: Medicare Other | Source: Ambulatory Visit | Attending: Radiation Oncology | Admitting: Radiation Oncology

## 2019-08-10 ENCOUNTER — Inpatient Hospital Stay: Payer: Medicare Other

## 2019-08-10 ENCOUNTER — Other Ambulatory Visit: Payer: Self-pay

## 2019-08-10 DIAGNOSIS — C155 Malignant neoplasm of lower third of esophagus: Secondary | ICD-10-CM | POA: Diagnosis not present

## 2019-08-11 ENCOUNTER — Other Ambulatory Visit: Payer: Self-pay

## 2019-08-11 ENCOUNTER — Inpatient Hospital Stay: Payer: Medicare Other

## 2019-08-11 ENCOUNTER — Ambulatory Visit
Admission: RE | Admit: 2019-08-11 | Discharge: 2019-08-11 | Disposition: A | Discharge status: Home / Self Care | Payer: Medicare Other | Source: Ambulatory Visit | Attending: Radiation Oncology | Admitting: Radiation Oncology

## 2019-08-11 DIAGNOSIS — C155 Malignant neoplasm of lower third of esophagus: Secondary | ICD-10-CM | POA: Diagnosis not present

## 2019-08-12 ENCOUNTER — Ambulatory Visit
Admission: RE | Admit: 2019-08-12 | Discharge: 2019-08-12 | Disposition: A | Discharge status: Home / Self Care | Payer: Medicare Other | Source: Ambulatory Visit | Attending: Radiation Oncology | Admitting: Radiation Oncology

## 2019-08-12 ENCOUNTER — Inpatient Hospital Stay: Payer: Medicare Other

## 2019-08-12 ENCOUNTER — Other Ambulatory Visit: Payer: Self-pay

## 2019-08-12 DIAGNOSIS — C155 Malignant neoplasm of lower third of esophagus: Secondary | ICD-10-CM | POA: Diagnosis not present

## 2019-08-13 ENCOUNTER — Ambulatory Visit
Admission: RE | Admit: 2019-08-13 | Discharge: 2019-08-13 | Disposition: A | Discharge status: Home / Self Care | Payer: Medicare Other | Source: Ambulatory Visit | Attending: Radiation Oncology | Admitting: Radiation Oncology

## 2019-08-13 ENCOUNTER — Other Ambulatory Visit: Payer: Self-pay

## 2019-08-13 ENCOUNTER — Inpatient Hospital Stay: Payer: Medicare Other

## 2019-08-13 DIAGNOSIS — C155 Malignant neoplasm of lower third of esophagus: Secondary | ICD-10-CM | POA: Diagnosis not present

## 2019-08-14 ENCOUNTER — Other Ambulatory Visit: Payer: Self-pay

## 2019-08-14 ENCOUNTER — Ambulatory Visit
Admission: RE | Admit: 2019-08-14 | Discharge: 2019-08-14 | Disposition: A | Discharge status: Home / Self Care | Payer: Medicare Other | Source: Ambulatory Visit | Attending: Radiation Oncology | Admitting: Radiation Oncology

## 2019-08-14 ENCOUNTER — Inpatient Hospital Stay: Payer: Medicare Other

## 2019-08-14 ENCOUNTER — Other Ambulatory Visit: Payer: Medicare Other

## 2019-08-14 ENCOUNTER — Ambulatory Visit: Payer: Medicare Other | Admitting: Internal Medicine

## 2019-08-14 DIAGNOSIS — C155 Malignant neoplasm of lower third of esophagus: Secondary | ICD-10-CM | POA: Diagnosis not present

## 2019-08-17 ENCOUNTER — Inpatient Hospital Stay: Payer: Medicare Other

## 2019-08-17 ENCOUNTER — Ambulatory Visit
Admission: RE | Admit: 2019-08-17 | Discharge: 2019-08-17 | Disposition: A | Discharge status: Home / Self Care | Payer: Medicare Other | Source: Ambulatory Visit | Attending: Radiation Oncology | Admitting: Radiation Oncology

## 2019-08-17 ENCOUNTER — Inpatient Hospital Stay: Payer: Medicare Other | Attending: Internal Medicine

## 2019-08-17 ENCOUNTER — Other Ambulatory Visit: Payer: Self-pay

## 2019-08-17 DIAGNOSIS — Z510 Encounter for antineoplastic radiation therapy: Secondary | ICD-10-CM | POA: Insufficient documentation

## 2019-08-17 DIAGNOSIS — Z87891 Personal history of nicotine dependence: Secondary | ICD-10-CM | POA: Diagnosis not present

## 2019-08-17 DIAGNOSIS — C155 Malignant neoplasm of lower third of esophagus: Secondary | ICD-10-CM | POA: Insufficient documentation

## 2019-08-17 NOTE — Progress Notes (Signed)
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  Patient receiving radiation treatment.   Met with patient after radiation treatment today.  Patient reports that her appetite is poor.  Reports that is has been decreasing as she progresses through radiation.  Denies pain, dysphagia, nausea.  Reports that she likes boost shakes the best but can drinking ensure.  Usually drinks 3 per day when she can afford them.  Reports that she has been eating greens, other vegetables, pimento cheese, potted meats, chicken salad, boiled chicken, milk, egg, cheese, pinto beans, peas, peaches, pear, yogurt and ice cream.      Medications: reviewed  Labs: reviewed  Anthropometrics:   Weighed patient today in clinic at 124 lb decreased from 127 lb on 10/6   NUTRITION DIAGNOSIS: Predicted suboptimal energy intake continues   INTERVENTION:  Provided case of ensure enlive to patient today.   Reviewed high calorie, high protein foods    MONITORING, EVALUATION, GOAL: Patient will consume adequate calories and protein to prevent weight loss during treatment.     NEXT VISIT: November 16th after radiation  Howard Bunte B. Zenia Resides, [place], [place] Registered Dietitian [phone] (pager)

## 2019-08-18 ENCOUNTER — Inpatient Hospital Stay: Payer: Medicare Other

## 2019-08-18 ENCOUNTER — Ambulatory Visit
Admission: RE | Admit: 2019-08-18 | Discharge: 2019-08-18 | Disposition: A | Discharge status: Home / Self Care | Payer: Medicare Other | Source: Ambulatory Visit | Attending: Radiation Oncology | Admitting: Radiation Oncology

## 2019-08-18 ENCOUNTER — Other Ambulatory Visit: Payer: Self-pay

## 2019-08-18 ENCOUNTER — Other Ambulatory Visit: Payer: Self-pay | Admitting: *Deleted

## 2019-08-18 DIAGNOSIS — C155 Malignant neoplasm of lower third of esophagus: Secondary | ICD-10-CM | POA: Diagnosis not present

## 2019-08-18 MED ORDER — SUCRALFATE 1 GM/10ML PO SUSP: 1.0000 g | Freq: Four times a day (QID) | ORAL | 2 refills | Status: DC

## 2019-08-19 ENCOUNTER — Other Ambulatory Visit: Payer: Self-pay

## 2019-08-19 ENCOUNTER — Ambulatory Visit
Admission: RE | Admit: 2019-08-19 | Discharge: 2019-08-19 | Disposition: A | Discharge status: Home / Self Care | Payer: Medicare Other | Source: Ambulatory Visit | Attending: Radiation Oncology | Admitting: Radiation Oncology

## 2019-08-19 ENCOUNTER — Ambulatory Visit: Payer: Medicare Other | Admitting: Internal Medicine

## 2019-08-19 ENCOUNTER — Other Ambulatory Visit: Payer: Medicare Other

## 2019-08-19 ENCOUNTER — Inpatient Hospital Stay: Payer: Medicare Other

## 2019-08-19 DIAGNOSIS — C155 Malignant neoplasm of lower third of esophagus: Secondary | ICD-10-CM | POA: Diagnosis not present

## 2019-08-20 ENCOUNTER — Other Ambulatory Visit: Payer: Self-pay

## 2019-08-20 ENCOUNTER — Inpatient Hospital Stay: Payer: Medicare Other

## 2019-08-20 ENCOUNTER — Ambulatory Visit
Admission: RE | Admit: 2019-08-20 | Discharge: 2019-08-20 | Disposition: A | Discharge status: Home / Self Care | Payer: Medicare Other | Source: Ambulatory Visit | Attending: Radiation Oncology | Admitting: Radiation Oncology

## 2019-08-20 DIAGNOSIS — C155 Malignant neoplasm of lower third of esophagus: Secondary | ICD-10-CM | POA: Diagnosis not present

## 2019-08-21 ENCOUNTER — Ambulatory Visit
Admission: RE | Admit: 2019-08-21 | Discharge: 2019-08-21 | Disposition: A | Discharge status: Home / Self Care | Payer: Medicare Other | Source: Ambulatory Visit | Attending: Radiation Oncology | Admitting: Radiation Oncology

## 2019-08-21 ENCOUNTER — Inpatient Hospital Stay: Payer: Medicare Other

## 2019-08-21 ENCOUNTER — Other Ambulatory Visit: Payer: Self-pay

## 2019-08-21 DIAGNOSIS — C155 Malignant neoplasm of lower third of esophagus: Secondary | ICD-10-CM | POA: Diagnosis not present

## 2019-08-24 ENCOUNTER — Ambulatory Visit
Admission: RE | Admit: 2019-08-24 | Discharge: 2019-08-24 | Disposition: A | Discharge status: Home / Self Care | Payer: Medicare Other | Source: Ambulatory Visit | Attending: Radiation Oncology | Admitting: Radiation Oncology

## 2019-08-24 ENCOUNTER — Inpatient Hospital Stay: Payer: Medicare Other

## 2019-08-24 ENCOUNTER — Other Ambulatory Visit: Payer: Self-pay

## 2019-08-24 DIAGNOSIS — C155 Malignant neoplasm of lower third of esophagus: Secondary | ICD-10-CM | POA: Diagnosis not present

## 2019-08-25 ENCOUNTER — Ambulatory Visit
Admission: RE | Admit: 2019-08-25 | Discharge: 2019-08-25 | Disposition: A | Discharge status: Home / Self Care | Payer: Medicare Other | Source: Ambulatory Visit | Attending: Radiation Oncology | Admitting: Radiation Oncology

## 2019-08-25 ENCOUNTER — Inpatient Hospital Stay: Payer: Medicare Other

## 2019-08-25 ENCOUNTER — Other Ambulatory Visit: Payer: Self-pay

## 2019-08-25 DIAGNOSIS — C155 Malignant neoplasm of lower third of esophagus: Secondary | ICD-10-CM | POA: Diagnosis not present

## 2019-08-26 ENCOUNTER — Ambulatory Visit: Payer: Medicare Other

## 2019-08-26 ENCOUNTER — Inpatient Hospital Stay: Payer: Medicare Other

## 2019-08-26 ENCOUNTER — Other Ambulatory Visit: Payer: Self-pay

## 2019-08-27 ENCOUNTER — Inpatient Hospital Stay: Payer: Medicare Other

## 2019-08-27 ENCOUNTER — Other Ambulatory Visit: Payer: Self-pay

## 2019-08-27 ENCOUNTER — Ambulatory Visit
Admission: RE | Admit: 2019-08-27 | Discharge: 2019-08-27 | Disposition: A | Discharge status: Home / Self Care | Payer: Medicare Other | Source: Ambulatory Visit | Attending: Radiation Oncology | Admitting: Radiation Oncology

## 2019-08-27 ENCOUNTER — Encounter: Payer: Self-pay | Admitting: Internal Medicine

## 2019-08-27 DIAGNOSIS — C155 Malignant neoplasm of lower third of esophagus: Secondary | ICD-10-CM

## 2019-08-27 LAB — CBC WITH DIFFERENTIAL/PLATELET
Abs Immature Granulocytes: 0.02 10*3/uL (ref 0.00–0.07)
Basophils Absolute: 0.1 10*3/uL (ref 0.0–0.1)
Basophils Relative: 1 %
Eosinophils Absolute: 0.3 10*3/uL (ref 0.0–0.5)
Eosinophils Relative: 5 %
HCT: 32.8 % — ABNORMAL LOW (ref 36.0–46.0)
Hemoglobin: 10.4 g/dL — ABNORMAL LOW (ref 12.0–15.0)
Immature Granulocytes: 0 %
Lymphocytes Relative: 26 %
Lymphs Abs: 1.7 10*3/uL (ref 0.7–4.0)
MCH: 31.1 pg (ref 26.0–34.0)
MCHC: 31.7 g/dL (ref 30.0–36.0)
MCV: 98.2 fL (ref 80.0–100.0)
Monocytes Absolute: 0.6 10*3/uL (ref 0.1–1.0)
Monocytes Relative: 10 %
Neutro Abs: 3.8 10*3/uL (ref 1.7–7.7)
Neutrophils Relative %: 58 %
Platelets: 193 10*3/uL (ref 150–400)
RBC: 3.34 MIL/uL — ABNORMAL LOW (ref 3.87–5.11)
RDW: 14.6 % (ref 11.5–15.5)
WBC: 6.6 10*3/uL (ref 4.0–10.5)
nRBC: 0 % (ref 0.0–0.2)

## 2019-08-27 LAB — BASIC METABOLIC PANEL
Anion gap: 9 (ref 5–15)
BUN: 54 mg/dL — ABNORMAL HIGH (ref 8–23)
CO2: 12 mmol/L — ABNORMAL LOW (ref 22–32)
Calcium: 9.5 mg/dL (ref 8.9–10.3)
Chloride: 111 mmol/L (ref 98–111)
Creatinine, Ser: 2.78 mg/dL — ABNORMAL HIGH (ref 0.44–1.00)
GFR calc Af Amer: 20 mL/min — ABNORMAL LOW (ref >60)
GFR calc non Af Amer: 17 mL/min — ABNORMAL LOW (ref >60)
Glucose, Bld: 93 mg/dL (ref 70–99)
Potassium: 4 mmol/L (ref 3.5–5.1)
Sodium: 132 mmol/L — ABNORMAL LOW (ref 135–145)

## 2019-08-27 LAB — SAMPLE TO BLOOD BANK

## 2019-08-27 NOTE — Progress Notes (Signed)
Pre-visit assessment completed prior to [place] follow-up appointment with Dr. Rogue Bussing on 08/28/2019.   Concerns include:   1. Very little appetite and reported 7lb wt loss. She is having less difficulty swallowing.  2. Pt reports that since starting radiation, she has noticed an increase in the amount of drainage coming from the tube in her left side. She reports that she is frequently having to change the dressing and that it leaks onto her clothing.

## 2019-08-28 ENCOUNTER — Inpatient Hospital Stay (HOSPITAL_BASED_OUTPATIENT_CLINIC_OR_DEPARTMENT_OTHER): Payer: Medicare Other | Admitting: Internal Medicine

## 2019-08-28 ENCOUNTER — Inpatient Hospital Stay: Payer: Medicare Other

## 2019-08-28 ENCOUNTER — Other Ambulatory Visit: Payer: Self-pay

## 2019-08-28 ENCOUNTER — Ambulatory Visit
Admission: RE | Admit: 2019-08-28 | Discharge: 2019-08-28 | Disposition: A | Discharge status: Home / Self Care | Payer: Medicare Other | Source: Ambulatory Visit | Attending: Radiation Oncology | Admitting: Radiation Oncology

## 2019-08-28 DIAGNOSIS — C155 Malignant neoplasm of lower third of esophagus: Secondary | ICD-10-CM | POA: Diagnosis not present

## 2019-08-28 NOTE — Assessment & Plan Note (Addendum)
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 Currently on with definitive radiation [until dec 3rd 2020].  Stable; tolerating radiation fairly well except for continued weight loss. [See below]  # ? ACTIVE MULTIPLE MYELOMA-given the severe anemia; given the 5% plasma cells/renal failure [no hypercalcemia or bone lesions on recent PET scan].  Monitor for now closely given the need for radiation/esophagus cancer.  #Weight loss dysphagia-secondary to esophagus cancer-status post evaluation with nutrition.  Recommended boost/Ensure.  Recommend appetite stimulant. Plan megace. Will send  Script.   #Chronic renal insufficiency-stage III-IV-stable.   # DISPOSITION: # follow up in 4 weeks MD- lab- cbc/cmp; MM panel;K/L light chains-  Dr.B

## 2019-08-28 NOTE — Progress Notes (Signed)
[place] OFFICE PROGRESS NOTE  Patient Care Team: [facility], [place] as PCP - General ([place]) [name], FNP as Nurse Practitioner (Family Medicine) [name], NP as Nurse Practitioner (Nurse Practitioner) [name], RN as Registered Nurse [name], RN as Registered Nurse [name], RN as Oncology Nurse Navigator  Cancer Staging Breast cancer in female [facility]) Staging form: Breast, AJCC 7th Edition - Clinical: Stage IIIA (T3, N1, M0) - Signed by [name], NP on 02/21/2016    Oncology History Overview Note  # 2016- JAN RUL non-small cell lung Danielle /squamous cell STAGE I;  s/p SBRT  ## Right middle lung nodule- s/p Bronch- atypical cells [[name]]; last CT May 2017 ; SEP 29th PET- ~2cm; low SUV. A. LUNG MASS, RIGHT; CT-GUIDED BIOPSY: - ADENOCARCINOMA, ACINAR AND PAPILLARY MORPHOLOGIES. S/p RT [finished Dec 2017] s/p RT [DEC 2017]; AUG 2019- PET-right middle lobe radiation changes no evidence of recurrence.  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  T1b- submucosal involvement; N0- stage I [[name]; [facility]]; unable to proceed with Endo mucosal resection-because of extensive disease [Dr.Branch; [facility]]; recommend definitive radiation  #September 2020 worsening macrocytic anemia--bone marrow biopsy shows plasma cells up to 5%; unlikely to explain patient's anemia.  MDS is still a possibility-if anemia worsens would recommend peripheral blood NGS.   # 2000-  Left breast cancer [T3N1- stage III] mastecomy s/p RT; s/p chemo; Tamoxifen  # NOV 111 2017- Mol testing [RML- adeno Danielle]  # 2019-July-aug 2019-ischemic colitis status post right hemicolectomy [Dr.[name]]; acute renal failure;   -----------------------------------------------------------------    DIAGNOSIS: RML Lung Danielle adeno [stage I] #squamous cell carcinoma esophagus [question stage]   CURRENT/MOST RECENT THERAPY: surveillance       Breast cancer in female [facility])  07/24/1999 Initial Diagnosis   Breast cancer in female ([place]) T3 N1 M0 tumor ER/PR positive   08/24/1999 -  Anti-estrogen oral therapy   Started tamoxifen   08/24/1999 Surgery   Status post modified radical mastectomy of left breast    Chemotherapy      Radiation Therapy     11/24/2003 - 02/20/2009 Anti-estrogen oral therapy   Started Aromasin   Carcinoma of overlapping sites of left breast in female, estrogen receptor positive ([place])  06/22/2016 Initial Diagnosis   Cancer of overlapping sites of left female breast ([place])   Malignant neoplasm of upper lobe of right lung ([place])  Malignant neoplasm of lower third of esophagus ([name])  05/01/2019 Initial Diagnosis   Malignant neoplasm of lower third of esophagus ([place])     INTERVAL HISTORY:  Danielle [name] 63 y.o.  female pleasant patient above history of multiple malignancies including most recent stage I squamous cell cancer of esophagus currently on radiation; and severe anemia is here for follow-up.  Patient is currently on radiation definitive for squamous cell carcinoma esophagus.  Complains of mild difficulty swallowing.  Otherwise denies any nausea vomiting.  Positive weight loss.  Poor appetite.  Complains of fatigue.  Review of Systems  Constitutional: Positive for malaise/fatigue and weight loss. Negative for chills, diaphoresis and fever.  HENT: Negative for nosebleeds and sore throat.   Eyes: Negative for double vision.  Respiratory: Positive for cough and shortness of breath. Negative for hemoptysis, sputum production and wheezing.   Cardiovascular: Negative for chest pain, palpitations, orthopnea and leg swelling.  Gastrointestinal: Negative for abdominal pain, blood in stool,  constipation, diarrhea, heartburn, melena, nausea and vomiting.       Dysphagia.  Genitourinary: Negative for dysuria, frequency and urgency.  Musculoskeletal: Negative for back pain and joint pain.  Skin: Negative.  Negative for itching and rash.  Neurological: Negative for dizziness, tingling, focal weakness, weakness and headaches.  Endo/Heme/Allergies: Does not bruise/bleed easily.  Psychiatric/Behavioral: Negative for depression. The patient is not nervous/anxious and does not have insomnia.       PAST MEDICAL HISTORY :  Past Medical History:  Diagnosis Date  . Anxiety   . Arthritis   . Breast cancer ([place]) 2000  . Cancer ([place]) left    breast cancer 2000, chemo tx's with total mastectomy and lymph nodes resected.   . Cancer of right lung ([place]) 02/21/2016   rad tx's.   . CHF (congestive heart failure) ([place])   . COPD (chronic obstructive pulmonary disease) ([place])   . Dependence on supplemental oxygen   . Depression   . Heart murmur   . Hypertension   . Lung nodule   . Lymphedema   . Personal history of chemotherapy   . Personal history of radiation therapy   . Shortness of breath dyspnea    with exertion  . Status post chemotherapy 2001   left breast cancer  . Status post radiation therapy 2001   left breast cancer    PAST SURGICAL HISTORY :   Past Surgical History:  Procedure Laterality Date  . Breast Biospy Left    [facility]  . BREAST SURGERY    . COLONOSCOPY N/A 04/30/2018   Procedure: COLONOSCOPY;  Surgeon: Virgel Manifold, MD;  Location: [facility] ENDOSCOPY;  Service: Endoscopy;  Laterality: N/A;  . COLONOSCOPY N/A 07/22/2018   Procedure: COLONOSCOPY;  Surgeon: Virgel Manifold, MD;  Location: [facility] ENDOSCOPY;  Service: Endoscopy;  Laterality: N/A;  . DILATION AND CURETTAGE OF UTERUS    . ELECTROMAGNETIC NAVIGATION BROCHOSCOPY Right 04/11/2016   Procedure: ELECTROMAGNETIC NAVIGATION BRONCHOSCOPY;  Surgeon: Vilinda Boehringer, MD;  Location: [facility] ORS;  Service:  Cardiopulmonary;  Laterality: Right;  . ESOPHAGOGASTRODUODENOSCOPY N/A 07/22/2018   Procedure: ESOPHAGOGASTRODUODENOSCOPY (EGD);  Surgeon: Virgel Manifold, MD;  Location: [facility] ENDOSCOPY;  Service: Endoscopy;  Laterality: N/A;  . ESOPHAGOGASTRODUODENOSCOPY (EGD) WITH PROPOFOL N/A 05/07/2018   Procedure: ESOPHAGOGASTRODUODENOSCOPY (EGD) WITH PROPOFOL;  Surgeon: Lucilla Lame, MD;  Location: [facility] ENDOSCOPY;  Service: Endoscopy;  Laterality: N/A;  . ESOPHAGOGASTRODUODENOSCOPY (EGD) WITH PROPOFOL N/A 04/24/2019   Procedure: ESOPHAGOGASTRODUODENOSCOPY (EGD) WITH PROPOFOL;  Surgeon: Jonathon Bellows, MD;  Location: [facility] ENDOSCOPY;  Service: Gastroenterology;  Laterality: N/A;  . EUS N/A 05/07/2019   Procedure: FULL UPPER ENDOSCOPIC ULTRASOUND (EUS) RADIAL;  Surgeon: Jola Schmidt, MD;  Location: [facility] ENDOSCOPY;  Service: Endoscopy;  Laterality: N/A;  . history of colonoscopy]    . ILEOSCOPY N/A 07/22/2018   Procedure: ILEOSCOPY THROUGH STOMA;  Surgeon: Virgel Manifold, MD;  Location: [facility] ENDOSCOPY;  Service: Endoscopy;  Laterality: N/A;  . ILEOSTOMY    . ILEOSTOMY N/A 09/08/2018   Procedure: ILEOSTOMY REVISION POSSIBLE CREATION;  Surgeon: Herbert Pun, MD;  Location: [facility] ORS;  Service: General;  Laterality: N/A;  . ILEOSTOMY CLOSURE N/A 08/15/2018   Procedure: DILATION OF ILEOSTOMY STRICTURE;  Surgeon: Herbert Pun, MD;  Location: [facility] ORS;  Service: General;  Laterality: N/A;  . LAPAROTOMY Right 05/04/2018   Procedure: EXPLORATORY LAPAROTOMY right colectomy right and left ostomy;  Surgeon: Herbert Pun, MD;  Location: [facility] ORS;  Service: General;  Laterality: Right;  .  LUNG BIOPSY    . MASTECTOMY Left    2000, [facility]  . ROTATOR CUFF REPAIR Right    [facility]    FAMILY HISTORY :   Family History  Problem Relation Age of Onset  . Breast cancer Mother 57  . Cancer Mother        Breast   . Cirrhosis Father   . Breast cancer Paternal Aunt 58  . Cancer Maternal Aunt         Breast     SOCIAL HISTORY:   Social History   Tobacco Use  . Smoking status: Former Smoker    Packs/day: 0.50    Years: 20.00    Pack years: 10.00    Types: Cigarettes    Quit date: 07/02/2012    Years since quitting: 7.2  . Smokeless tobacco: Current User    Types: Snuff  . Tobacco comment: quit 2014  Substance Use Topics  . Alcohol use: Not Currently    Comment: Occasionally  . Drug use: No    ALLERGIES:  has No Known Allergies.  MEDICATIONS:  Current Outpatient Medications  Medication Sig Dispense Refill  . acetaminophen (TYLENOL) 325 MG tablet Take 2 tablets (650 mg total) by mouth every 6 (six) hours as needed for mild pain (or Fever >/= 101).    [name] albuterol (PROVENTIL HFA;VENTOLIN HFA) 108 (90 Base) MCG/ACT inhaler Inhale 2 puffs into the lungs every 6 (six) hours as needed for wheezing or shortness of breath. 1 Inhaler 2  . calcium-vitamin D (OSCAL WITH D) 250-125 MG-UNIT tablet Take 1 tablet by mouth daily.    . carvedilol (COREG) 3.125 MG tablet Take 3.125 mg by mouth 2 (two) times daily with a meal.    . citalopram (CELEXA) 40 MG tablet Take 40 mg by mouth daily.    . ferrous sulfate 325 (65 FE) MG tablet Take 1 tablet (325 mg total) by mouth 2 (two) times daily with a meal. 60 tablet 3  . Fluticasone-Umeclidin-Vilant 100-62.5-25 MCG/INH AEPB Inhale 1 puff into the lungs daily.     . furosemide (LASIX) 20 MG tablet Take 1 tablet (20 mg total) by mouth daily. And additional '20mg'$  dose as needed (Patient taking differently: Take 20-40 mg by mouth See admin instructions. Take 1 tablet ('20mg'$ ) by mouth daily - may take a second tablet ('20mg'$ ) by mouth daily as needed for severe swelling) 40 tablet 5  . guaiFENesin-dextromethorphan (ROBITUSSIN DM) 100-10 MG/5ML syrup Take 5 mLs by mouth every 4 (four) hours as needed for cough.    . loperamide (IMODIUM A-D) 2 MG tablet Take 1 tablet (2 mg total) by mouth as needed for diarrhea or loose stools. 30 tablet 0  . Multiple Vitamin  (MULTIVITAMIN WITH MINERALS) TABS tablet Take 1 tablet by mouth daily. 30 tablet 1  . pantoprazole (PROTONIX) 40 MG tablet Take 1 tablet (40 mg total) by mouth 2 (two) times daily before a meal. 60 tablet 0  . potassium chloride (K-DUR) 10 MEQ tablet Take 10 mEq by mouth daily.     . sucralfate (CARAFATE) 1 GM/10ML suspension Take 10 mLs (1 g total) by mouth 4 (four) times daily. 1200 mL 2   No current facility-administered medications for this visit.     PHYSICAL EXAMINATION: ECOG PERFORMANCE STATUS: 1 - Symptomatic but completely ambulatory  BP 93/69 (BP Location: Right Arm, Patient Position: Sitting, Cuff Size: Normal)   Pulse 81   Temp (!) 96.6 F (35.9 C) (Tympanic)   Wt 122 lb 4  oz (55.5 kg)   BMI 19.73 kg/m   Filed Weights   08/27/19 1408  Weight: 122 lb 4 oz (55.5 kg)    Physical Exam  Constitutional: She is oriented to person, place, and time.  Thin built moderately nourished female patient.  She is walking herself.  She is alone.  HENT:  Head: Normocephalic and atraumatic.  Mouth/Throat: Oropharynx is clear and moist. No oropharyngeal exudate.  Eyes: Pupils are equal, round, and reactive to light.  Neck: Normal range of motion. Neck supple.  Cardiovascular: Normal rate and regular rhythm.  Pulmonary/Chest: No respiratory distress. She has no wheezes.  Decreased breath sounds bilaterally.  No wheeze or crackles.  Abdominal: Soft. Bowel sounds are normal. She exhibits no distension and no mass. There is no abdominal tenderness. There is no rebound and no guarding.  Musculoskeletal: Normal range of motion.        General: No tenderness or edema.  Neurological: She is alert and oriented to person, place, and time.  Skin: Skin is warm.  Psychiatric: Affect normal.       LABORATORY DATA:  I have reviewed the data as listed    Component Value Date/Time   NA 132 (L) 08/27/2019 1134   NA 132 (L) 02/09/2015 1100   K 4.0 08/27/2019 1134   K 3.8 02/09/2015 1100    CL 111 08/27/2019 1134   CL 95 (L) 02/09/2015 1100   CO2 12 (L) 08/27/2019 1134   CO2 29 02/09/2015 1100   GLUCOSE 93 08/27/2019 1134   GLUCOSE 105 (H) 02/09/2015 1100   BUN 54 (H) 08/27/2019 1134   BUN 16 02/09/2015 1100   CREATININE 2.78 (H) 08/27/2019 1134   CREATININE 0.81 02/09/2015 1100   CALCIUM 9.5 08/27/2019 1134   CALCIUM 9.1 02/09/2015 1100   PROT 8.1 07/01/2019 1219   PROT 7.7 02/09/2015 1100   ALBUMIN 4.0 07/01/2019 1219   ALBUMIN 4.3 02/09/2015 1100   AST 26 07/01/2019 1219   AST 29 02/09/2015 1100   ALT 23 07/01/2019 1219   ALT 20 02/09/2015 1100   ALKPHOS 72 07/01/2019 1219   ALKPHOS 69 02/09/2015 1100   BILITOT 0.8 07/01/2019 1219   BILITOT 0.9 02/09/2015 1100   GFRNONAA 17 (L) 08/27/2019 1134   GFRNONAA >60 02/09/2015 1100   GFRAA 20 (L) 08/27/2019 1134   GFRAA >60 02/09/2015 1100    No results found for: SPEP, UPEP  Lab Results  Component Value Date   WBC 6.6 08/27/2019   NEUTROABS 3.8 08/27/2019   HGB 10.4 (L) 08/27/2019   HCT 32.8 (L) 08/27/2019   MCV 98.2 08/27/2019   PLT 193 08/27/2019      Chemistry      Component Value Date/Time   NA 132 (L) 08/27/2019 1134   NA 132 (L) 02/09/2015 1100   K 4.0 08/27/2019 1134   K 3.8 02/09/2015 1100   CL 111 08/27/2019 1134   CL 95 (L) 02/09/2015 1100   CO2 12 (L) 08/27/2019 1134   CO2 29 02/09/2015 1100   BUN 54 (H) 08/27/2019 1134   BUN 16 02/09/2015 1100   CREATININE 2.78 (H) 08/27/2019 1134   CREATININE 0.81 02/09/2015 1100      Component Value Date/Time   CALCIUM 9.5 08/27/2019 1134   CALCIUM 9.1 02/09/2015 1100   ALKPHOS 72 07/01/2019 1219   ALKPHOS 69 02/09/2015 1100   AST 26 07/01/2019 1219   AST 29 02/09/2015 1100   ALT 23 07/01/2019 1219   ALT 20 02/09/2015  1100   BILITOT 0.8 07/01/2019 1219   BILITOT 0.9 02/09/2015 1100       RADIOGRAPHIC STUDIES: I have personally reviewed the radiological images as listed and agreed with the findings in the report. No results found.    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 Currently on with definitive radiation [until dec 3rd 2020].  Stable; tolerating radiation fairly well except for continued weight loss. [See below]  # ? ACTIVE MULTIPLE MYELOMA-given the severe anemia; given the 5% plasma cells/renal failure [no hypercalcemia or bone lesions on recent PET scan].  Monitor for now closely given the need for radiation/esophagus cancer.  #Weight loss dysphagia-secondary to esophagus cancer-status post evaluation with nutrition.  Recommended boost/Ensure.  Recommend appetite stimulant. Plan megace. Will send  Script.   #Chronic renal insufficiency-stage III-IV-stable.   # DISPOSITION: # follow up in 4 weeks MD- lab- cbc/cmp; MM panel;K/L light chains-  Dr.B     No orders of the defined types were placed in this encounter.  All questions were answered. The patient knows to call the clinic with any problems, questions or concerns.      [name], MD 09/21/2019 3:24 PM

## 2019-08-31 ENCOUNTER — Ambulatory Visit
Admission: RE | Admit: 2019-08-31 | Discharge: 2019-08-31 | Disposition: A | Discharge status: Home / Self Care | Payer: Medicare Other | Source: Ambulatory Visit | Attending: Radiation Oncology | Admitting: Radiation Oncology

## 2019-08-31 ENCOUNTER — Inpatient Hospital Stay: Payer: Medicare Other

## 2019-08-31 ENCOUNTER — Other Ambulatory Visit: Payer: Self-pay

## 2019-08-31 DIAGNOSIS — C155 Malignant neoplasm of lower third of esophagus: Secondary | ICD-10-CM | POA: Diagnosis not present

## 2019-08-31 NOTE — Progress Notes (Signed)
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  Patient receiving radiation treatments.    Met with patient following radiation treatments today.  Patient reports poor appetite.  Reports that she had recent teeth pull, soreness and mouth pain from infection and ill fitting dentures (full on top and partial on bottom).  Reports no taste but thinks that is related to infection in mouth.  Reports that she will be getting new dentures (top and bottom) soon.  Patient denies difficulty swallowing foods after takes carafate.  Reports that she can swallow all consistencies of foods without difficulty.  Reports that she has not eaten anything so far this am.  Reports that she drinks 2 ensure plus per day.  Ate macaroni and cheese and boiled chicken yesterday.  Denies food insecurity.    Medications: reviewed  Labs: reviewed from 11/12  Anthropometrics:   Weight 122 lb 4 oz on 11/13 decreased from 127 lb on 10/6.   5% weight loss in the last month, significant  NUTRITION DIAGNOSIS: Predicted suboptimal energy intake continues   INTERVENTION:  Provided another case of ensure enlive to patient today. Encouraged 2-3 per day.  Patient may benefit from trial of appetite stimulant. Reviewed strategies to help increase calories and protein especially with recent mouth issues.   Agree with discussion regarding nutrition support if weight continues to decline.     MONITORING, EVALUATION, GOAL: Patient will consume adequate calories and protein to prevent weight loss during treatment   NEXT VISIT: phone f/u Monday Nov 30th  Keidan Aumiller B. Zenia Resides, [place], [place] Registered Dietitian [phone] (pager)

## 2019-09-01 ENCOUNTER — Inpatient Hospital Stay: Payer: Medicare Other

## 2019-09-01 ENCOUNTER — Ambulatory Visit
Admission: RE | Admit: 2019-09-01 | Discharge: 2019-09-01 | Disposition: A | Discharge status: Home / Self Care | Payer: Medicare Other | Source: Ambulatory Visit | Attending: Radiation Oncology | Admitting: Radiation Oncology

## 2019-09-01 ENCOUNTER — Other Ambulatory Visit: Payer: Self-pay

## 2019-09-01 DIAGNOSIS — C155 Malignant neoplasm of lower third of esophagus: Secondary | ICD-10-CM | POA: Diagnosis not present

## 2019-09-02 ENCOUNTER — Other Ambulatory Visit: Payer: Self-pay

## 2019-09-02 ENCOUNTER — Ambulatory Visit: Payer: Medicare Other

## 2019-09-02 ENCOUNTER — Inpatient Hospital Stay: Payer: Medicare Other

## 2019-09-03 ENCOUNTER — Inpatient Hospital Stay: Payer: Medicare Other

## 2019-09-03 ENCOUNTER — Ambulatory Visit: Payer: Medicare Other

## 2019-09-04 ENCOUNTER — Inpatient Hospital Stay: Payer: Medicare Other

## 2019-09-04 ENCOUNTER — Ambulatory Visit
Admission: RE | Admit: 2019-09-04 | Discharge: 2019-09-04 | Disposition: A | Discharge status: Home / Self Care | Payer: Medicare Other | Source: Ambulatory Visit | Attending: Radiation Oncology | Admitting: Radiation Oncology

## 2019-09-04 ENCOUNTER — Other Ambulatory Visit: Payer: Self-pay

## 2019-09-04 DIAGNOSIS — C155 Malignant neoplasm of lower third of esophagus: Secondary | ICD-10-CM | POA: Diagnosis not present

## 2019-09-06 IMAGING — DX DG CHEST 1V PORT
1 series · 1 of 1 positions shown · non-contrast
Comparison: Prior radiograph from 04/19/2017 as well as prior CT
from 09/12/2017.

CLINICAL DATA: Initial evaluation for acute shortness of breath,
history of COPD.

EXAM:
PORTABLE CHEST 1 VIEW

[chest ap]
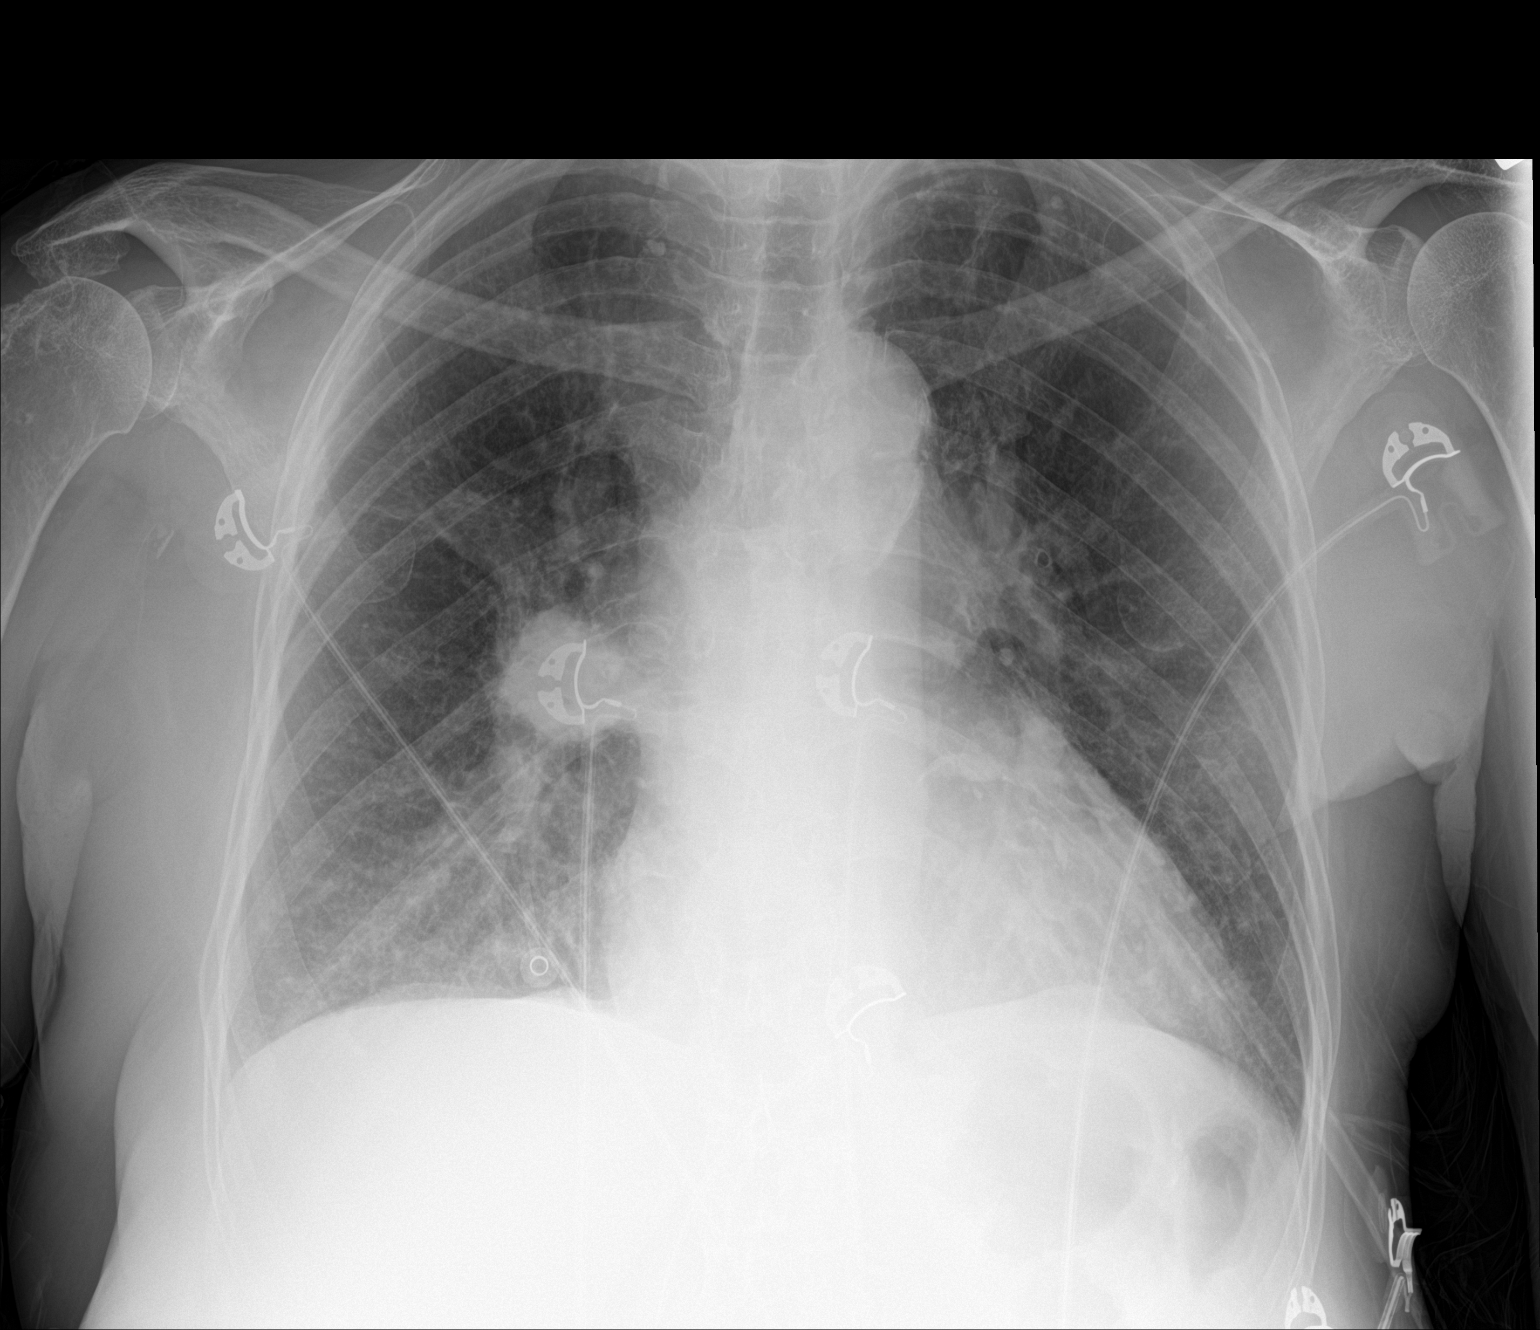

[1 of 1 positions shown; findings below may reference images not displayed]

FINDINGS: Mild cardiomegaly, stable. Aortic atherosclerosis. Increased
masslike density at the right hilum measuring approximately 3.7 x
3.5 cm. Adjacent right perihilar scarring and architectural
distortion. No other focal infiltrates. No pleural effusion or
pulmonary edema. Chronic bronchitic changes. No pneumothorax.

No acute osseous abnormality.
IMPRESSION: 1. Increased prominence of 3.5 cm mass like opacity at the right
hilum, concerning for worsened malignancy. Further assessment with
cross-sectional imaging of the chest suggested for further
characterization.
2. Underlying chronic emphysematous changes.
3. Cardiomegaly with aortic atherosclerosis..

## 2019-09-07 ENCOUNTER — Other Ambulatory Visit: Payer: Self-pay

## 2019-09-07 ENCOUNTER — Ambulatory Visit
Admission: RE | Admit: 2019-09-07 | Discharge: 2019-09-07 | Disposition: A | Discharge status: Home / Self Care | Payer: Medicare Other | Source: Ambulatory Visit | Attending: Radiation Oncology | Admitting: Radiation Oncology

## 2019-09-07 ENCOUNTER — Inpatient Hospital Stay: Payer: Medicare Other

## 2019-09-07 DIAGNOSIS — C155 Malignant neoplasm of lower third of esophagus: Secondary | ICD-10-CM | POA: Diagnosis not present

## 2019-09-08 ENCOUNTER — Ambulatory Visit
Admission: RE | Admit: 2019-09-08 | Discharge: 2019-09-08 | Disposition: A | Discharge status: Home / Self Care | Payer: Medicare Other | Source: Ambulatory Visit | Attending: Radiation Oncology | Admitting: Radiation Oncology

## 2019-09-08 ENCOUNTER — Inpatient Hospital Stay: Payer: Medicare Other

## 2019-09-08 ENCOUNTER — Other Ambulatory Visit: Payer: Self-pay

## 2019-09-08 DIAGNOSIS — C155 Malignant neoplasm of lower third of esophagus: Secondary | ICD-10-CM | POA: Diagnosis not present

## 2019-09-08 IMAGING — US US ABDOMEN LIMITED
1 series · 14 of 25 positions shown · non-contrast
Comparison: PET-CT dated July 13, 2016.

CLINICAL DATA: Elevated bilirubin.

EXAM:
ULTRASOUND ABDOMEN LIMITED RIGHT UPPER QUADRANT

[Series 1: us abdomen limited · 14 of 63 slices shown]
[im 1/63]
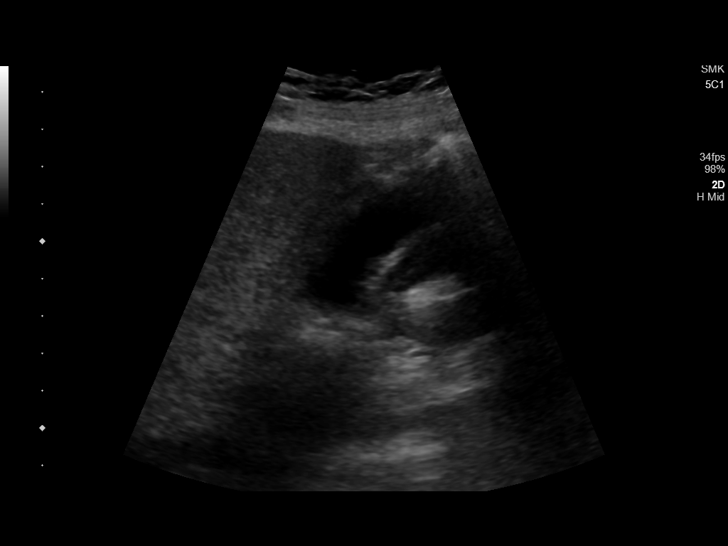
[im 6/63]
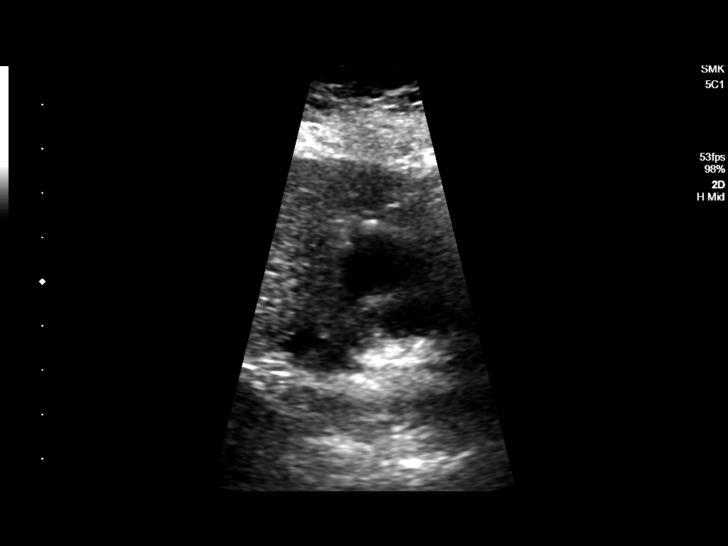
[im 11/63]
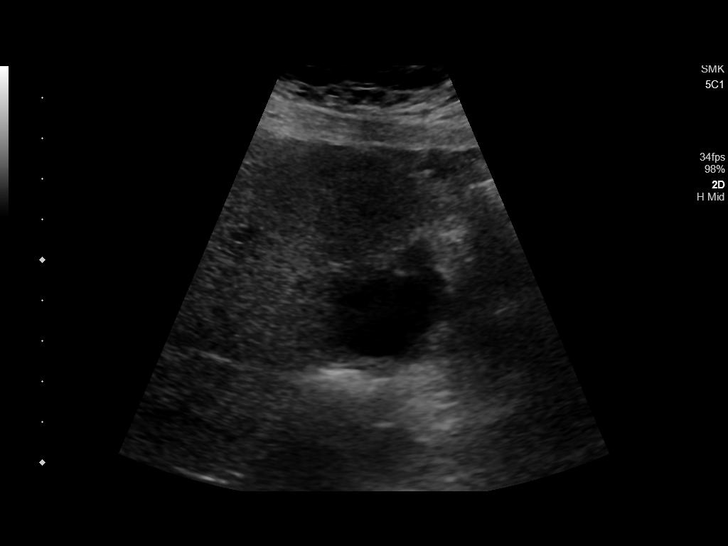
[im 16/63]
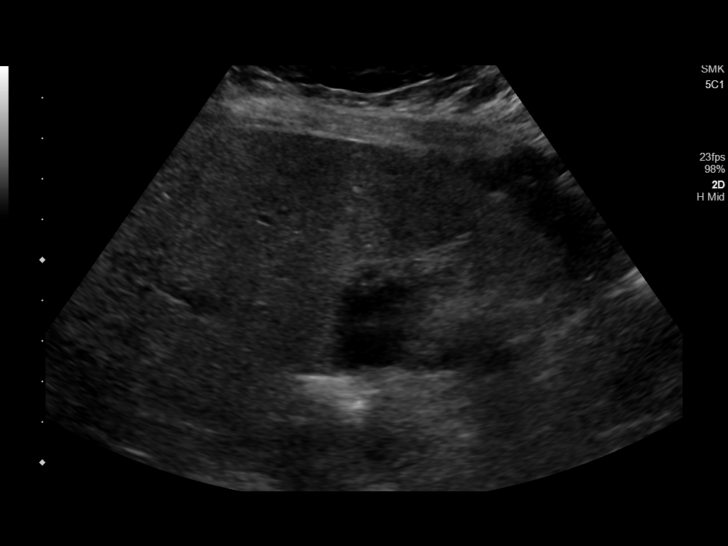
[im 21/63]
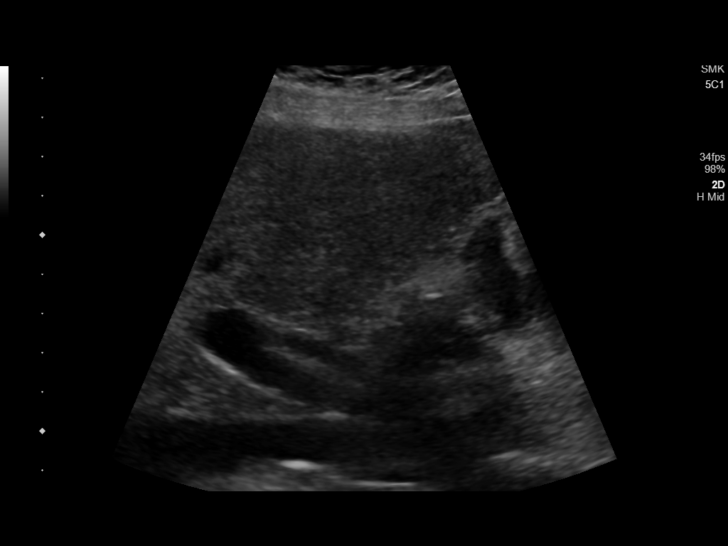
[im 24/63]
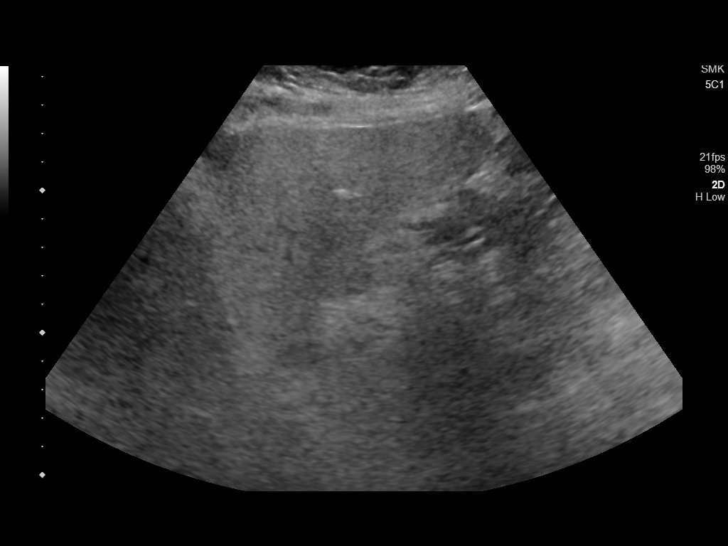
[im 29/63]
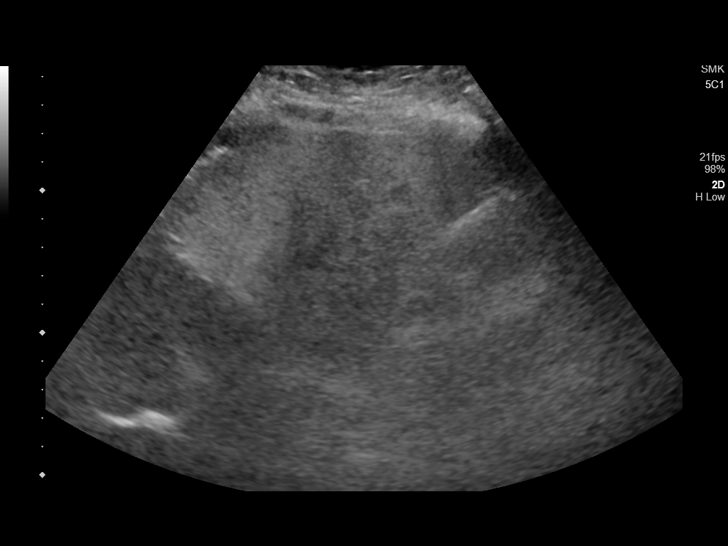
[im 34/63]
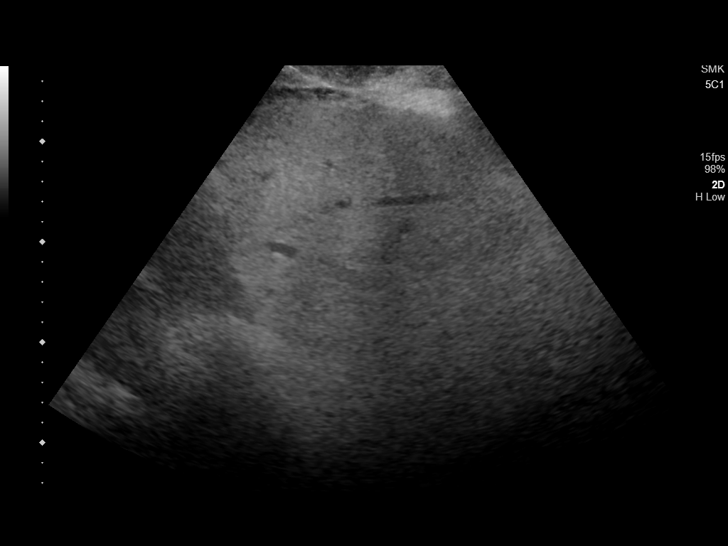
[im 39/63]
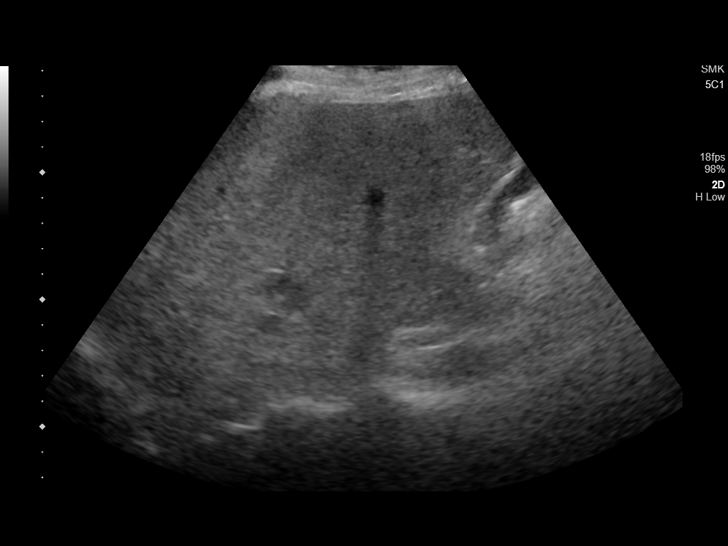
[im 42/63]
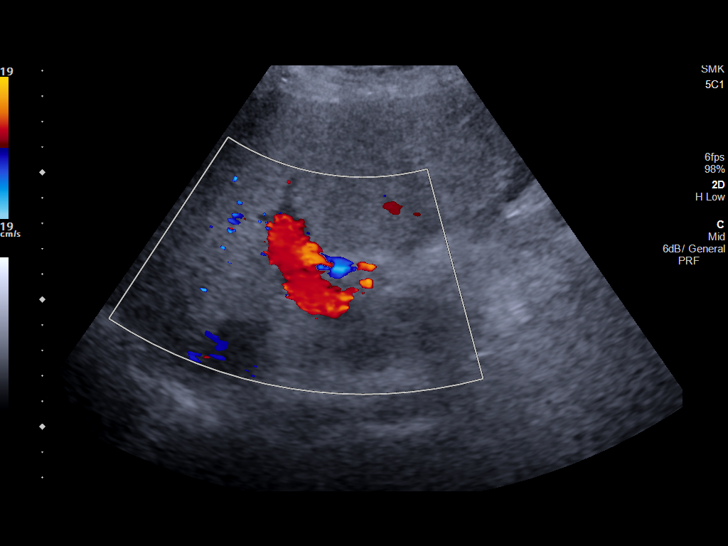
[im 47/63]
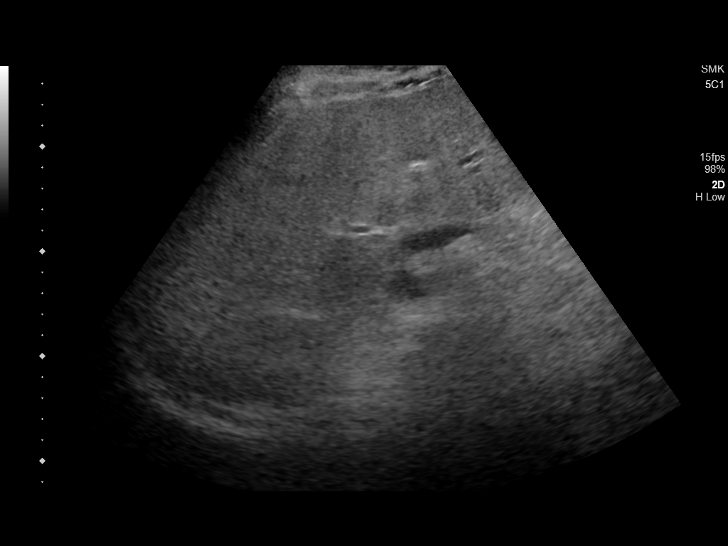
[im 52/63]
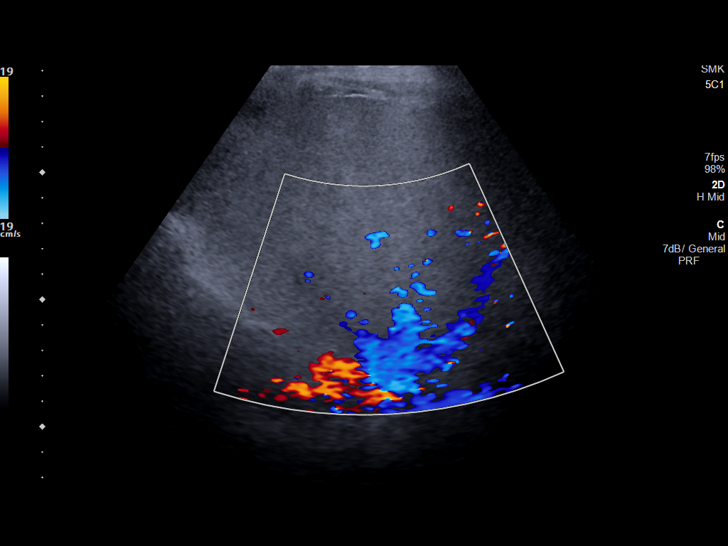
[im 57/63]
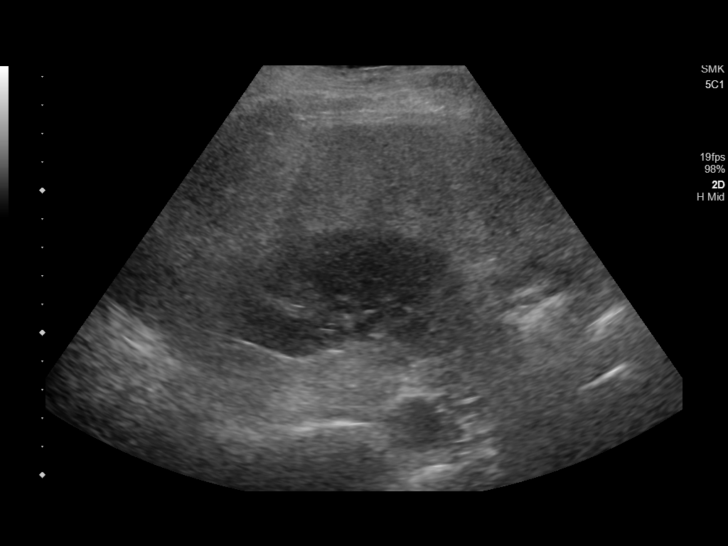
[im 63/63]
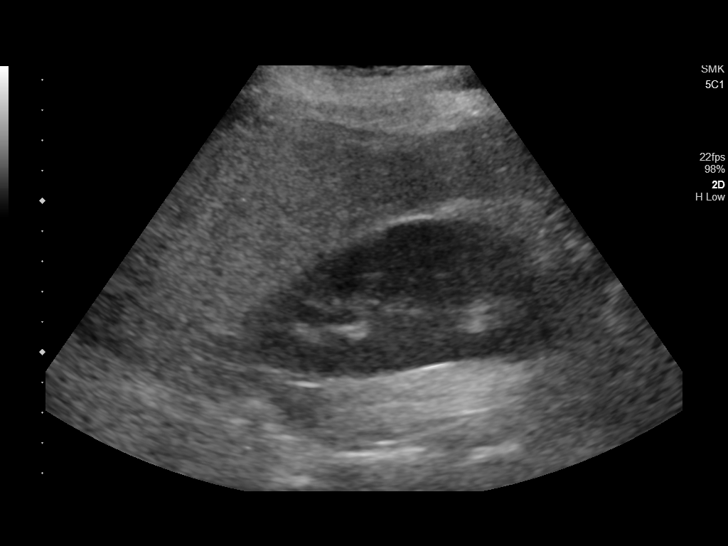

[14 of 25 positions shown; findings below may reference images not displayed]

FINDINGS: Gallbladder:

Contracted. No gallstones or wall thickening visualized. No
sonographic Murphy sign noted by sonographer.

Common bile duct:

Diameter: 6-7 mm, at the upper limits of normal in size.

Liver:

No focal lesion identified. Diffusely increased in parenchymal
echogenicity. 9 mm simple cyst in the right hepatic lobe. Portal
vein is patent on color Doppler imaging with normal direction of
blood flow towards the liver.
IMPRESSION: 1. No acute abnormality.
2. Severe hepatic steatosis.

## 2019-09-09 ENCOUNTER — Ambulatory Visit
Admission: RE | Admit: 2019-09-09 | Discharge: 2019-09-09 | Disposition: A | Discharge status: Home / Self Care | Payer: Medicare Other | Source: Ambulatory Visit | Attending: Radiation Oncology | Admitting: Radiation Oncology

## 2019-09-09 ENCOUNTER — Other Ambulatory Visit: Payer: Self-pay

## 2019-09-09 ENCOUNTER — Inpatient Hospital Stay: Payer: Medicare Other

## 2019-09-09 DIAGNOSIS — C155 Malignant neoplasm of lower third of esophagus: Secondary | ICD-10-CM | POA: Diagnosis not present

## 2019-09-11 ENCOUNTER — Inpatient Hospital Stay: Payer: Medicare Other

## 2019-09-12 IMAGING — CT CT ABD-PELV W/O CM
2 of 4 series · 16 of 46 positions shown, 18 images · non-contrast
Comparison: PET-CT, 07/13/2016

CLINICAL DATA: 62F acute hypoxemic respiratory failure, acute COPD
exacerbation, dehydration, hypokalemia, hypoglycemia, renal failure,
hypocalcemia, hypoalbuminemia, transaminasemia/hyperbilirubinemia,
BNP elevation, Troponin-I elevation, macrocytic anemiaHx of lung and
breast cancer

EXAM:
CT ABDOMEN AND PELVIS WITHOUT CONTRAST
TECHNIQUE: Multidetector CT imaging of the abdomen and pelvis was performed
following the standard protocol without IV contrast.

[Series 2: axial st · axial · 0.68mm/px · z∈[-391,-46]mm · 13 of 76 slices shown, 15 images]
[im 4/76  soft-tissue]
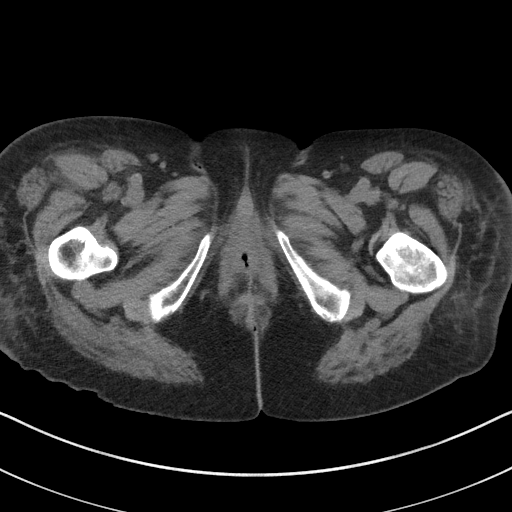
[im 4/76  bone]
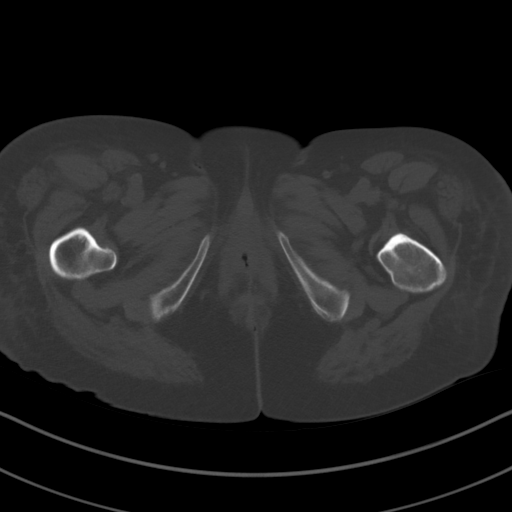
[im 10/76  soft-tissue]
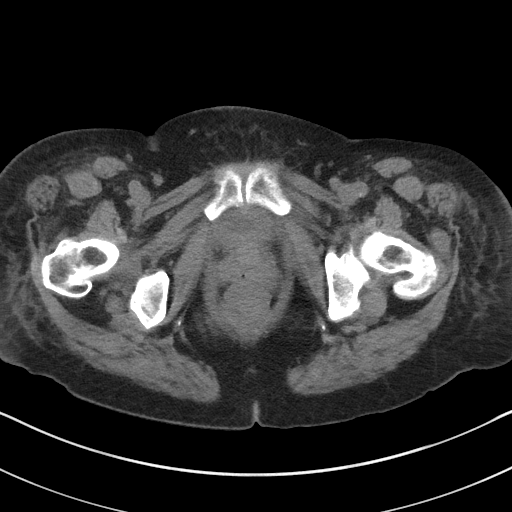
[im 16/76  soft-tissue]
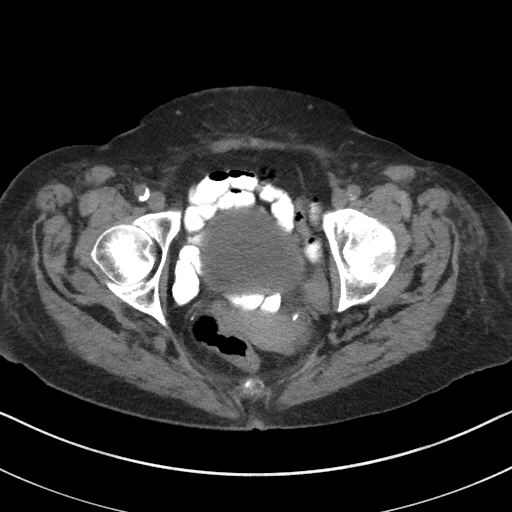
[im 22/76  soft-tissue]
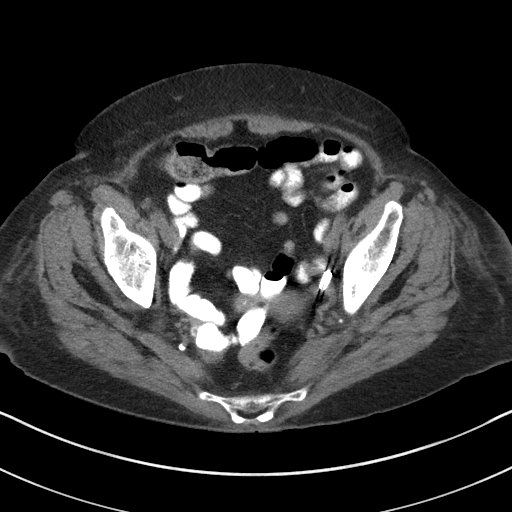
[im 28/76  soft-tissue]
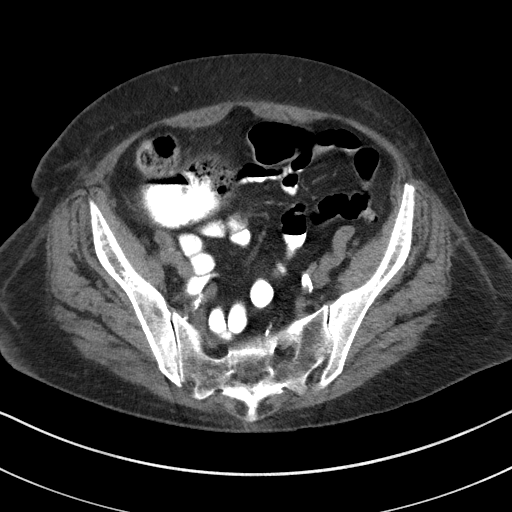
[im 34/76  soft-tissue]
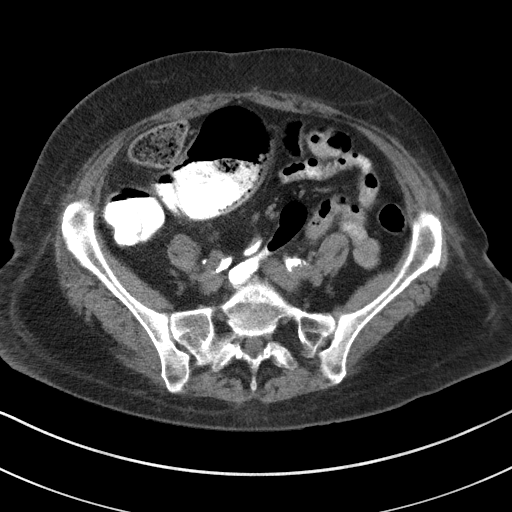
[im 40/76  soft-tissue]
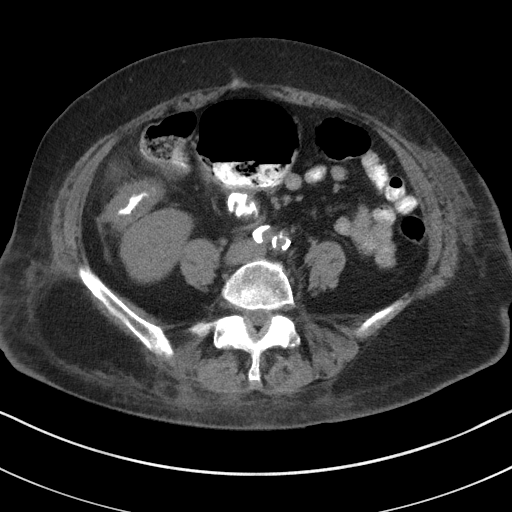
[im 43/76  soft-tissue]
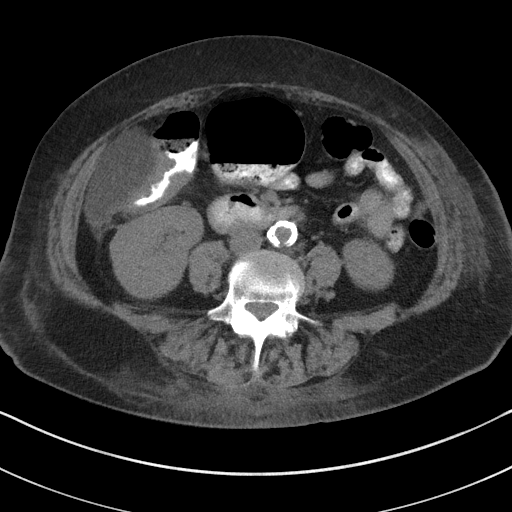
[im 49/76  soft-tissue]
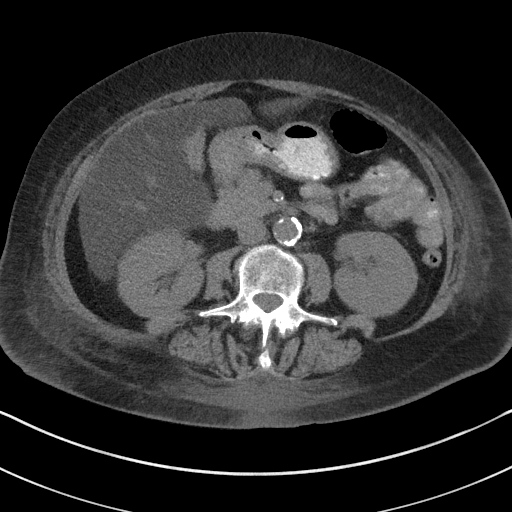
[im 49/76  bone]
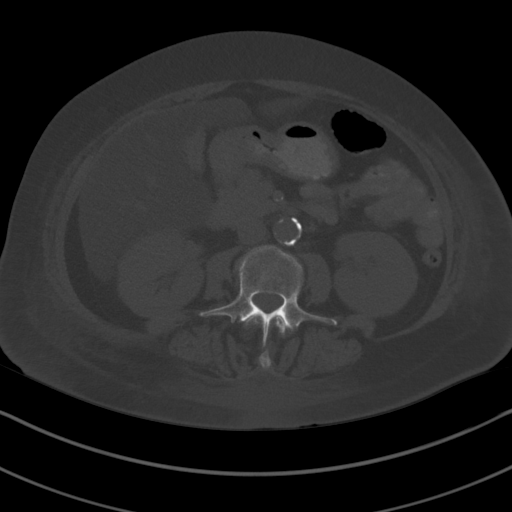
[im 55/76  soft-tissue]
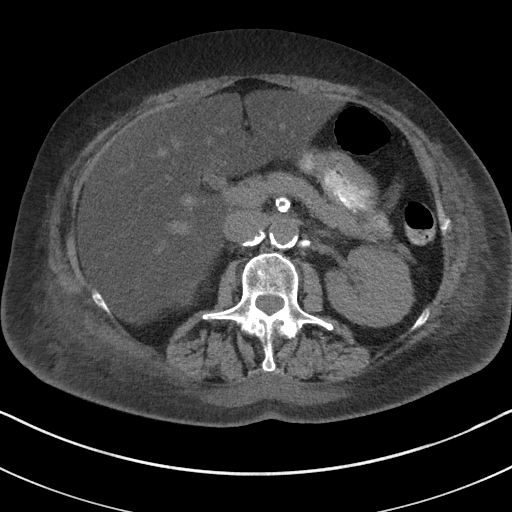
[im 61/76  soft-tissue]
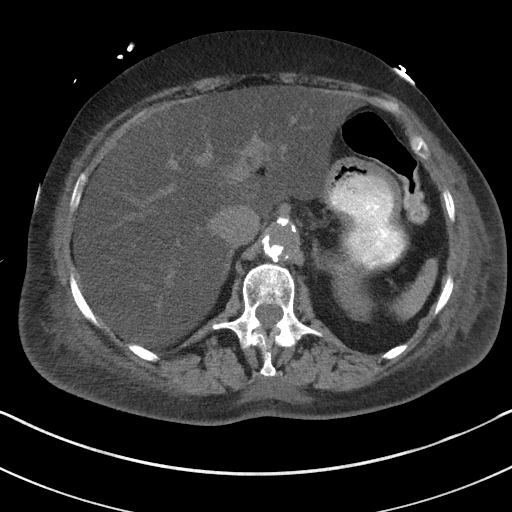
[im 67/76  soft-tissue]
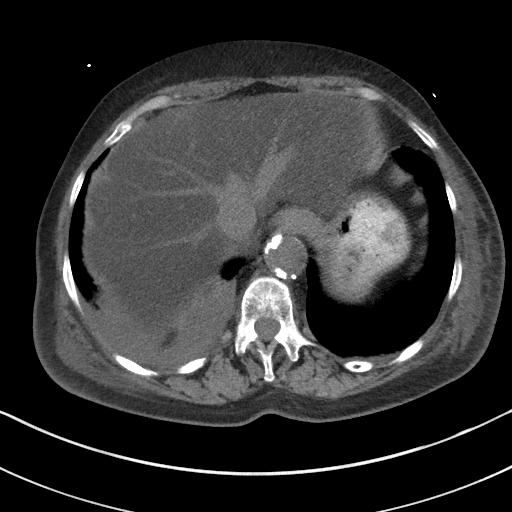
[im 73/76  soft-tissue]
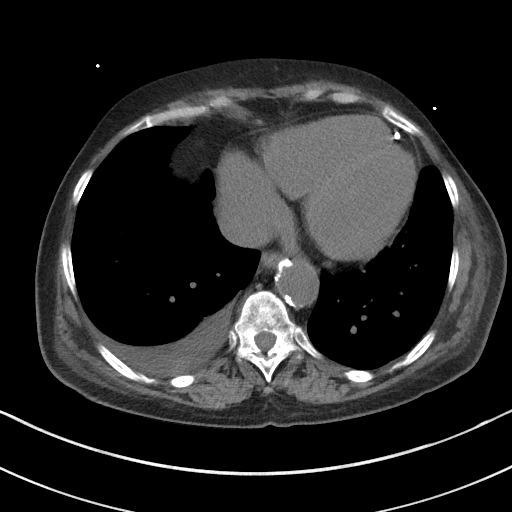

[Series 5: coronal st · coronal · 0.75mm/px · 3 of 100 slices shown]
[im 34/100  soft-tissue]
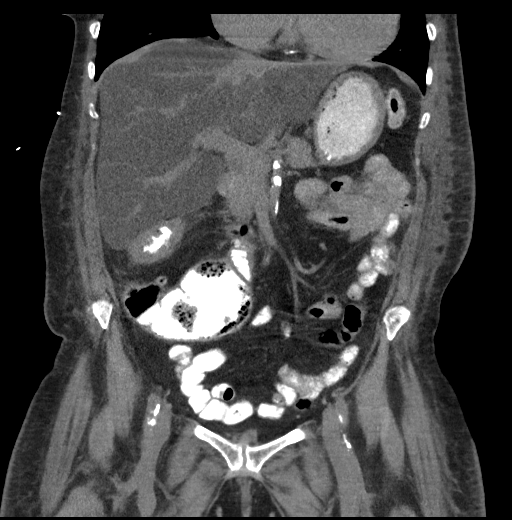
[im 45/100  soft-tissue]
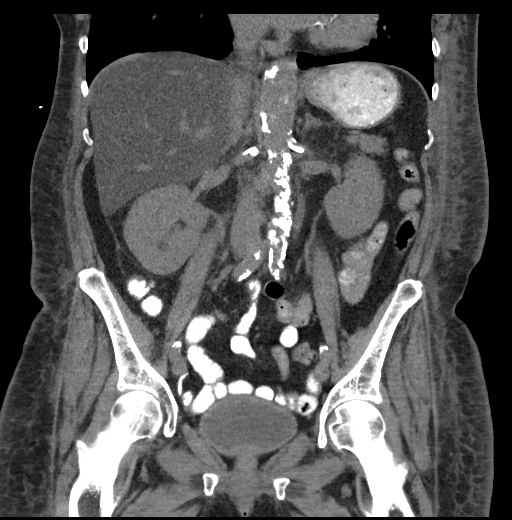
[im 56/100  soft-tissue]
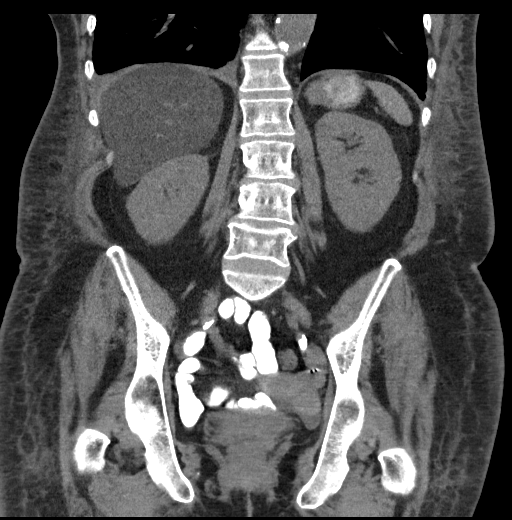

[16 of 46 positions shown; findings below may reference images not displayed]

FINDINGS: Lower chest: Small right pleural effusion. Minor dependent right
lower lobe atelectasis. No evidence of pneumonia or pulmonary edema.

Hepatobiliary: Markedly decreased attenuation of the liver
consistent with extensive fatty infiltration. No liver mass or focal
lesion. Gallbladder is contracted but otherwise unremarkable. No
bile duct dilation.

Pancreas: Unremarkable. No pancreatic ductal dilatation or
surrounding inflammatory changes.

Spleen: Small spleen.  No splenic mass or focal lesion.

Adrenals/Urinary Tract: Adrenal glands are unremarkable. Kidneys are
normal, without renal calculi, focal lesion, or hydronephrosis.
Bladder is unremarkable.

Stomach/Bowel: There is irregular wall thickening with associated
narrowing of the ascending colon at and just inferior to the hepatic
flexure, extending for 7-8 cm in length. Small area of inflammatory
type stranding is seen along the lateral margin of this portion of
the colon abutting the inferior margin of the liver. No other
colonic wall thickening or evidence of inflammation. Stomach and
small bowel are unremarkable. Cecum is distended to 6.6 cm. Normal
appendix is visualized.

Vascular/Lymphatic: Extensive aortic atherosclerotic calcifications
extending into the branch vessels. No aneurysm.

No adenopathy.

Reproductive: Uterus and bilateral adnexa are unremarkable.

Other: No abdominal wall hernia or abnormality. No abdominopelvic
ascites.

Musculoskeletal: Mild compression deformity of L5, of unclear
chronicity, but knows likely chronic. There is no associated
periatrial edema or hemorrhage. No other fractures. No osteoblastic
or osteolytic lesions.
IMPRESSION: 1. Focal area of irregular wall thickening and relative narrowing of
the right colon at and just below the hepatic flexure extending over
approximately 7-8 cm. This may be infectious or inflammatory or
neoplastic in etiology. Recommend follow-up colonoscopy. No other
evidence of an acute abnormality.
2. Extensive hepatic steatosis.
3. Small right pleural effusion.
4. Extensive aortic atherosclerosis.
5. Mild compression fracture of L5 of unclear chronicity but most
likely chronic.

## 2019-09-13 IMAGING — DX DG ABD PORTABLE 1V
2 series · 2 of 2 positions shown · non-contrast
Comparison: CT, 04/27/2018.

CLINICAL DATA: 62 y.o. female with history of COPD, lung cancer
status post SBRT, admitted with worsening shortness of breath and
cough. GI consulted for CT abdomen revealing ascending colon
thickening.Pt. Reports right upper quadrant, right mid quadrant
abdominal pain. Cramping, intermittent, [DATE], nonradiating,
associated with nausea. No vomiting.Reports having a bowel movement
on this admission, and passing gas. No obstructive symptoms. Poor
appetite.

EXAM:
PORTABLE ABDOMEN - 1 VIEW

[abdomen supine (1 of 2)]
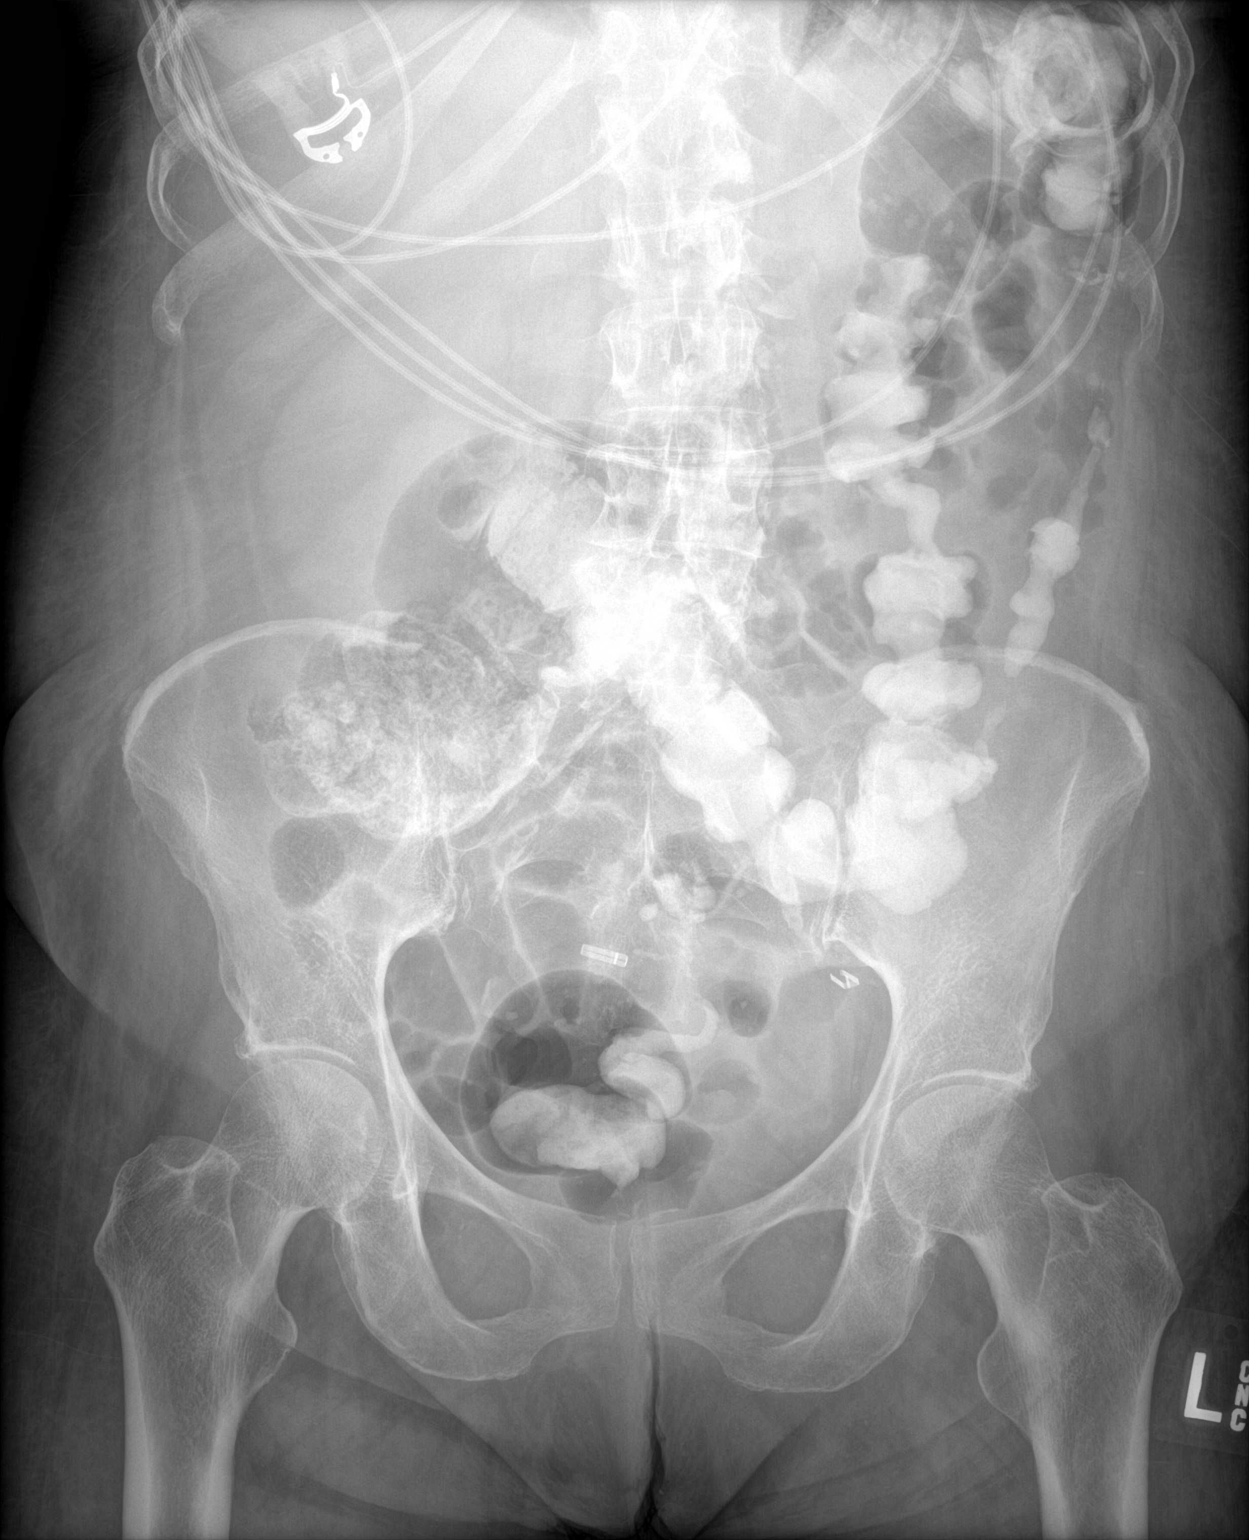

[abdomen supine (2 of 2)]
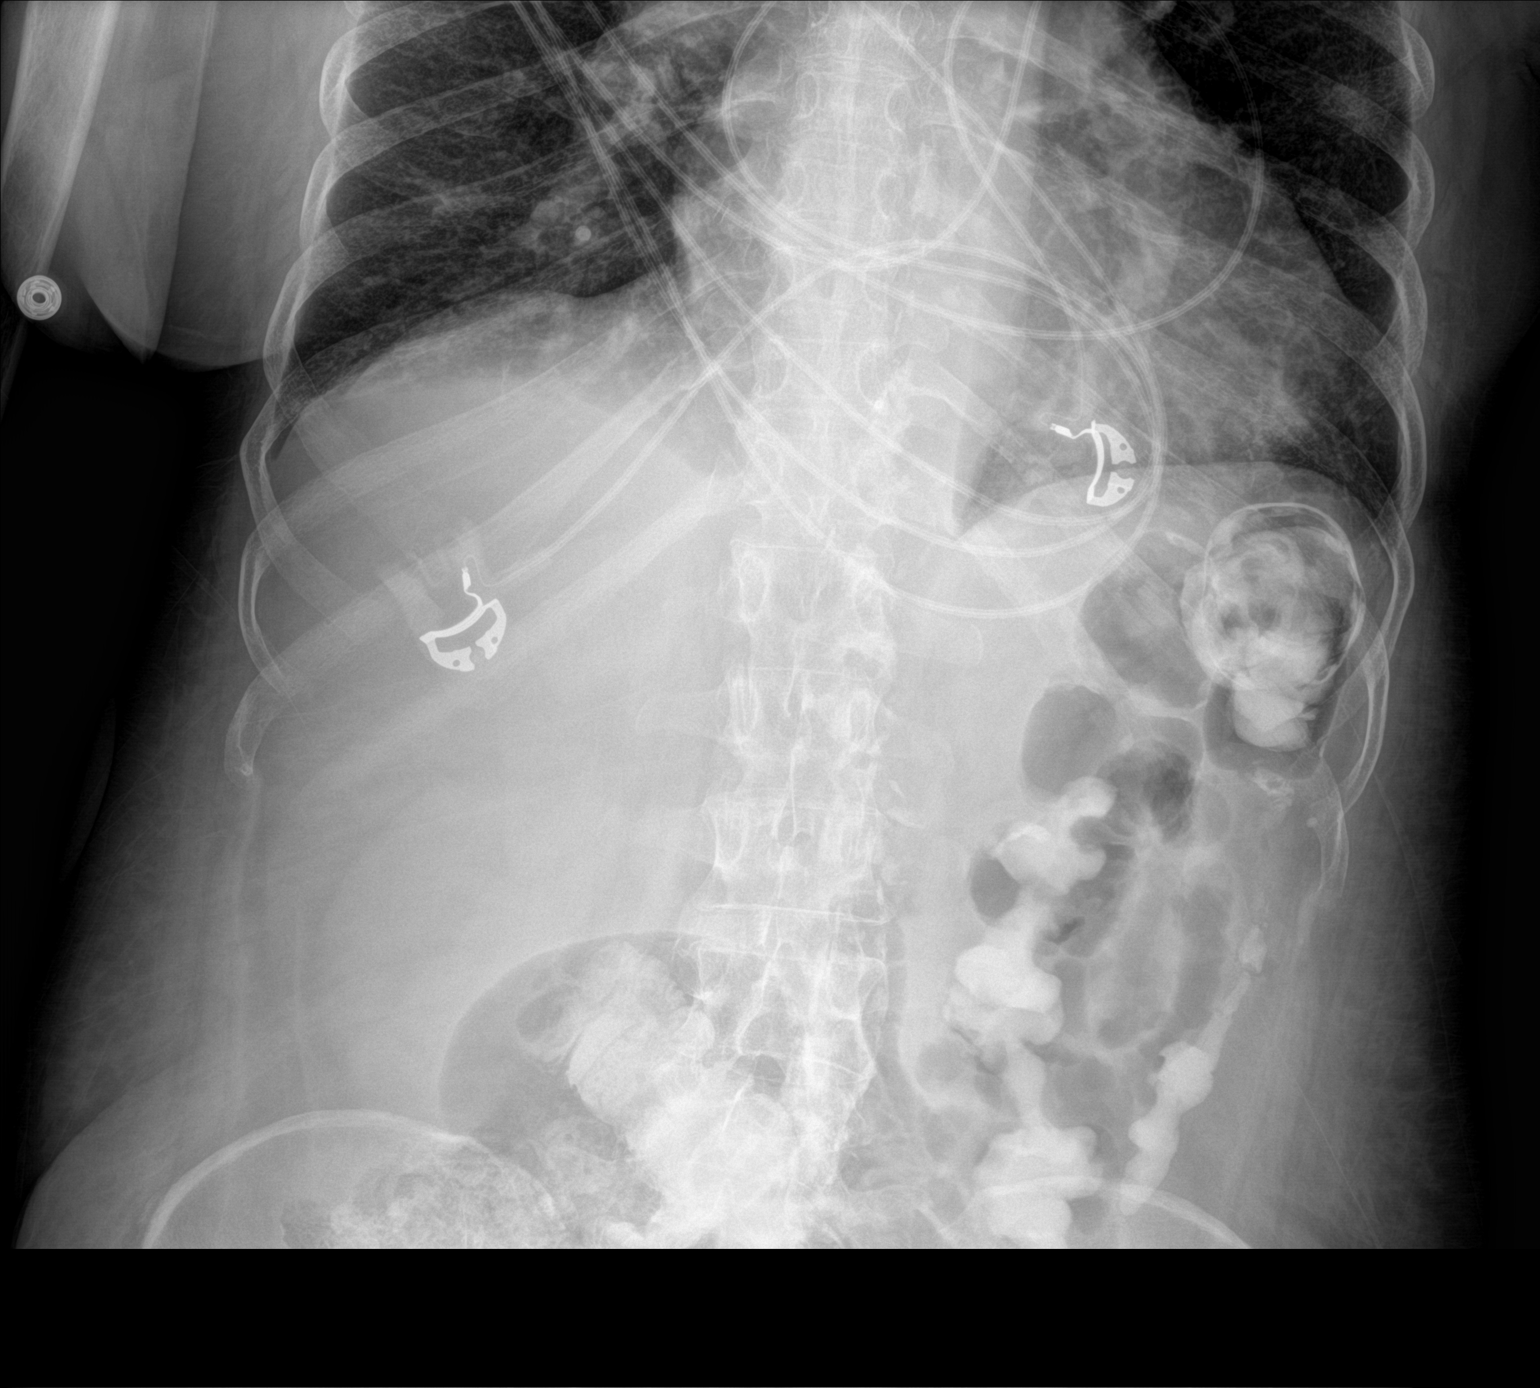

[2 of 2 positions shown; findings below may reference images not displayed]

FINDINGS: There is no bowel dilation to suggest obstruction or significant
adynamic ileus.

There are scattered aortic and iliac artery vascular calcifications.
Soft tissues are otherwise unremarkable.

Residual contrast is noted in the colon.

No acute skeletal abnormality
IMPRESSION: 1. No acute findings.  No evidence of bowel obstruction.

## 2019-09-14 ENCOUNTER — Inpatient Hospital Stay: Payer: Medicare Other

## 2019-09-14 ENCOUNTER — Ambulatory Visit
Admission: RE | Admit: 2019-09-14 | Discharge: 2019-09-14 | Disposition: A | Discharge status: Home / Self Care | Payer: Medicare Other | Source: Ambulatory Visit | Attending: Radiation Oncology | Admitting: Radiation Oncology

## 2019-09-14 ENCOUNTER — Telehealth: Payer: Self-pay

## 2019-09-14 ENCOUNTER — Other Ambulatory Visit: Payer: Self-pay

## 2019-09-14 DIAGNOSIS — C155 Malignant neoplasm of lower third of esophagus: Secondary | ICD-10-CM | POA: Diagnosis not present

## 2019-09-14 NOTE — Telephone Encounter (Signed)
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  Patient receiving radiation treatments and followed by Dr. Rogue Bussing.   Spoke with patient via phone for nutrition follow-up. Patient reports that she can only eat a little bit and feels so full.  Reports that she is to get her full upper dentures on Wednesday of this week along with partial on bottom.  Reports that she saw Dr Raul Del last week and he was going to prescribe her an appetite stimulant but the prescription was never called into her pharmacy.  Reports that she is drinking 3 ensure per day when she has enough.  Denies food getting stuck in esophagus area as long as she is taking carafate.  Reports low blood pressure and dizziness over the Thanksgiving holiday.      Medications: reviewed  Labs: reviewed  Anthropometrics:   Weight 121 lb 6 oz per [facility] on 09/08/2019 decreased from 122 lb on 11/13  10/6 127 lb   NUTRITION DIAGNOSIS: Predicted suboptimal energy intake continues   INTERVENTION:  Will provide another case of ensure enlive to patient to pick up on 12/3 Thursday Encouraged patient to call Dr Gust Brooms office back regarding prescription for appetite stimulant.  Patient planning to discuss dizziness and low blood pressure with Dr. Baruch Gouty tomorrow at radiation.  Also discussed [facility] at cancer center for patient to utilize as well.      MONITORING, EVALUATION, GOAL: Patient will consume adequate calories and protein to prevent weight loss during treatment   NEXT VISIT: Monday, Dec 7th after radiation  Dvid Pendry B. Zenia Resides, [place], [place] Registered Dietitian [phone] (pager)

## 2019-09-15 ENCOUNTER — Inpatient Hospital Stay: Payer: Medicare Other | Attending: Internal Medicine

## 2019-09-15 ENCOUNTER — Ambulatory Visit
Admission: RE | Admit: 2019-09-15 | Discharge: 2019-09-15 | Disposition: A | Discharge status: Home / Self Care | Payer: Medicare Other | Source: Ambulatory Visit | Attending: Radiation Oncology | Admitting: Radiation Oncology

## 2019-09-15 ENCOUNTER — Other Ambulatory Visit: Payer: Self-pay

## 2019-09-15 DIAGNOSIS — J069 Acute upper respiratory infection, unspecified: Secondary | ICD-10-CM | POA: Insufficient documentation

## 2019-09-15 DIAGNOSIS — Z510 Encounter for antineoplastic radiation therapy: Secondary | ICD-10-CM | POA: Insufficient documentation

## 2019-09-15 DIAGNOSIS — N184 Chronic kidney disease, stage 4 (severe): Secondary | ICD-10-CM | POA: Insufficient documentation

## 2019-09-15 DIAGNOSIS — I130 Hypertensive heart and chronic kidney disease with heart failure and stage 1 through stage 4 chronic kidney disease, or unspecified chronic kidney disease: Secondary | ICD-10-CM | POA: Insufficient documentation

## 2019-09-15 DIAGNOSIS — R634 Abnormal weight loss: Secondary | ICD-10-CM | POA: Insufficient documentation

## 2019-09-15 DIAGNOSIS — D649 Anemia, unspecified: Secondary | ICD-10-CM | POA: Insufficient documentation

## 2019-09-15 DIAGNOSIS — C155 Malignant neoplasm of lower third of esophagus: Secondary | ICD-10-CM | POA: Diagnosis present

## 2019-09-15 DIAGNOSIS — C9000 Multiple myeloma not having achieved remission: Secondary | ICD-10-CM | POA: Insufficient documentation

## 2019-09-15 DIAGNOSIS — Z87891 Personal history of nicotine dependence: Secondary | ICD-10-CM | POA: Insufficient documentation

## 2019-09-15 DIAGNOSIS — Z9221 Personal history of antineoplastic chemotherapy: Secondary | ICD-10-CM | POA: Insufficient documentation

## 2019-09-16 ENCOUNTER — Other Ambulatory Visit: Payer: Self-pay

## 2019-09-16 ENCOUNTER — Ambulatory Visit: Payer: Medicare Other

## 2019-09-16 ENCOUNTER — Ambulatory Visit
Admission: RE | Admit: 2019-09-16 | Discharge: 2019-09-16 | Disposition: A | Discharge status: Home / Self Care | Payer: Medicare Other | Source: Ambulatory Visit | Attending: Radiation Oncology | Admitting: Radiation Oncology

## 2019-09-16 ENCOUNTER — Inpatient Hospital Stay: Payer: Medicare Other

## 2019-09-16 DIAGNOSIS — C155 Malignant neoplasm of lower third of esophagus: Secondary | ICD-10-CM | POA: Diagnosis not present

## 2019-09-16 IMAGING — CT CT CTA ABD/PEL W/CM AND/OR W/O CM
3 of 9 series · 10 of 46 positions shown, 16 images · IV contrast (iopamidol)
Comparison: 04/27/2018

CLINICAL DATA: Ischemia, abdomen pelvis. Pt states she has
abdominal fluid, pain across lower abdomen several weeks. acute
hypoxemic respiratory failure, acute COPD exacerbation, dehydration,
hypokalemia, hypoglycemia, renal failure, hypocalcemia,
hypoalbuminemia, transaminasemia/hyperbilirubinemia, BNP elevation,
Troponin-I elevation, macrocytic anemia.

EXAM:
CTA ABDOMEN AND PELVIS WITH CONTRAST
TECHNIQUE: Multidetector CT imaging of the abdomen and pelvis was performed
using the standard protocol during bolus administration of
intravenous contrast. Multiplanar reconstructed images and MIPs were
obtained and reviewed to evaluate the vascular anatomy.
CONTRAST:  100mL [id number] IOPAMIDOL ([id number]) INJECTION 76%

[Series 4: axial arterial · axial · arterial · 0.69mm/px · z∈[-1240,-1152]mm · 3 of 207 slices shown]
[im 22/207  soft-tissue]
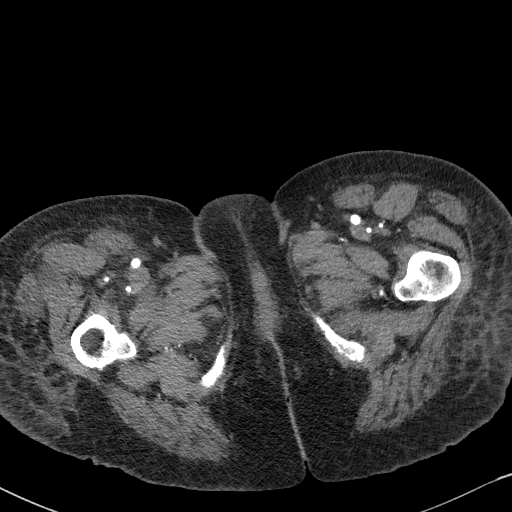
[im 44/207  soft-tissue]
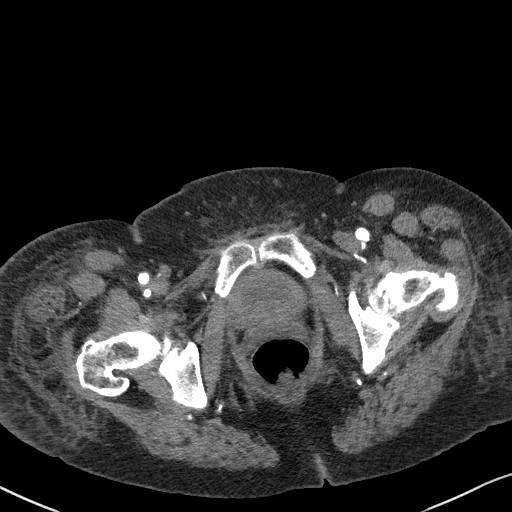
[im 66/207  soft-tissue]
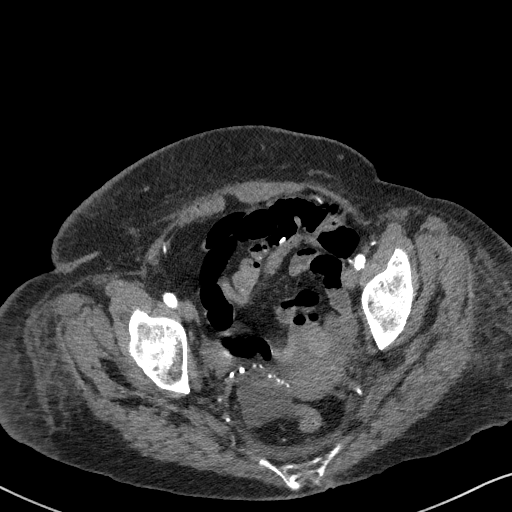

[Series 5: axial venous · axial · portal-venous · 0.69mm/px · z∈[-1186,-936]mm · 5 of 76 slices shown, 10 images]
[im 13/76  soft-tissue]
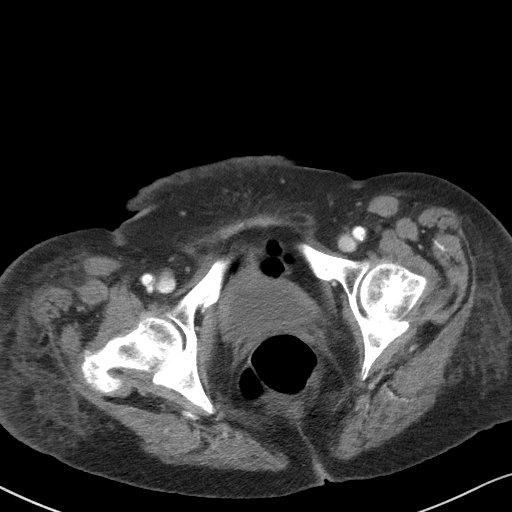
[im 13/76  bone]
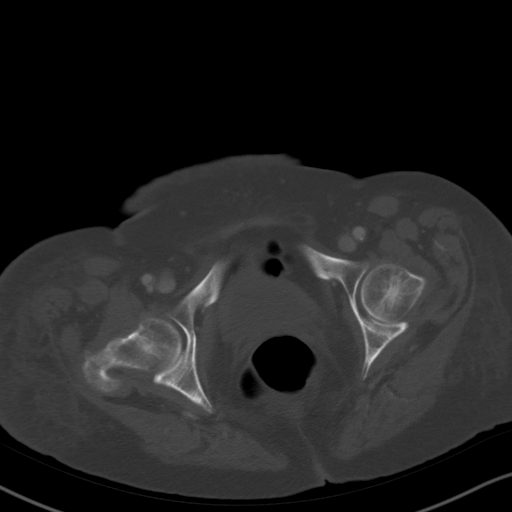
[im 26/76  soft-tissue]
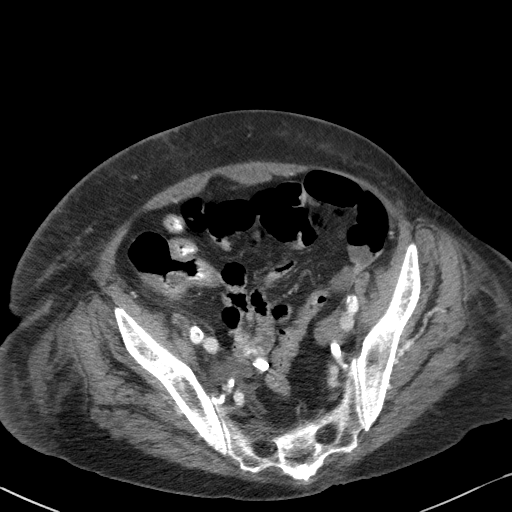
[im 26/76  lung]
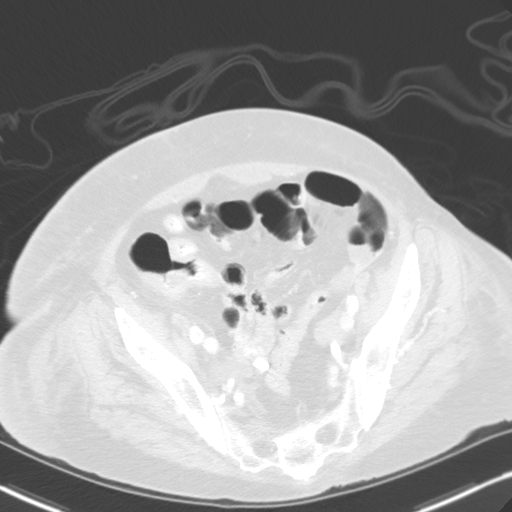
[im 38/76  soft-tissue]
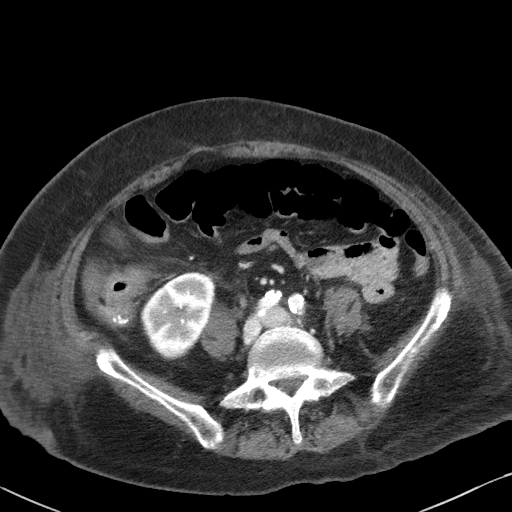
[im 38/76  lung]
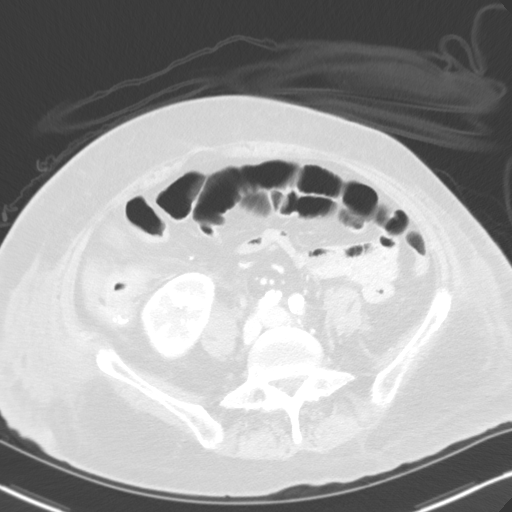
[im 51/76  soft-tissue]
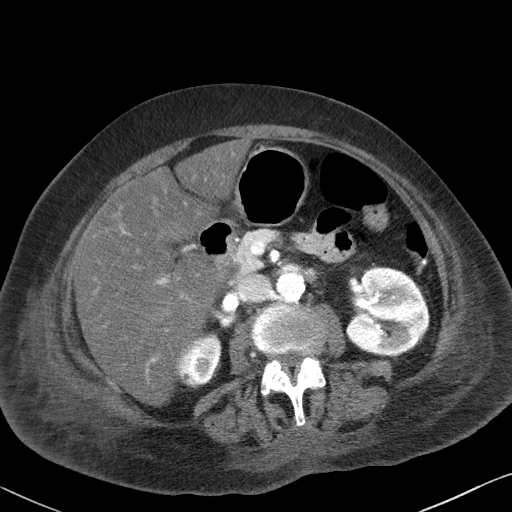
[im 51/76  lung]
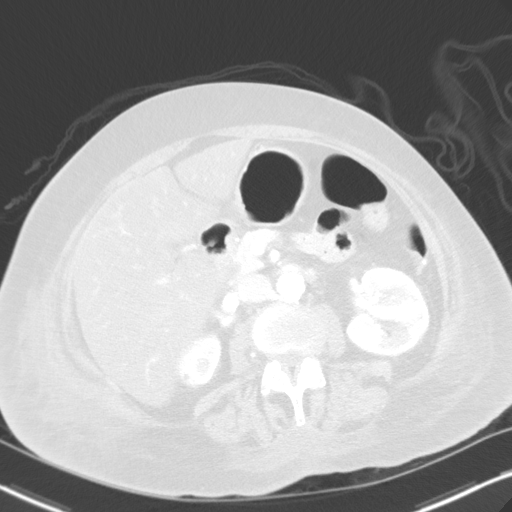
[im 63/76  soft-tissue]
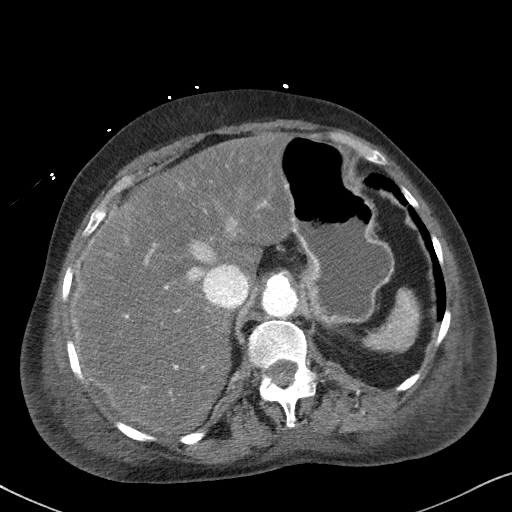
[im 63/76  lung]
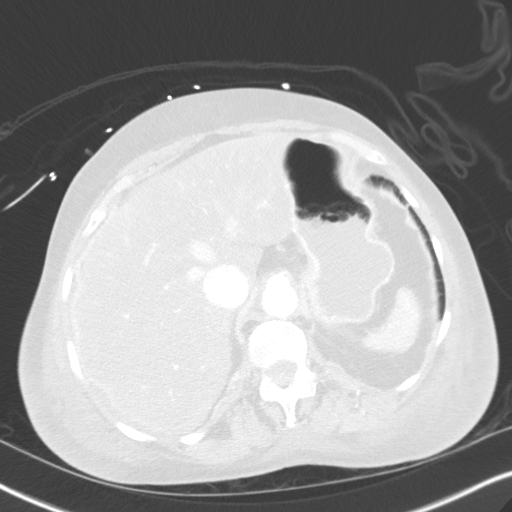

[Series 13: coronal venous mpr · coronal · portal-venous · 0.76mm/px · 2 of 148 slices shown, 3 images]
[im 50/148  soft-tissue]
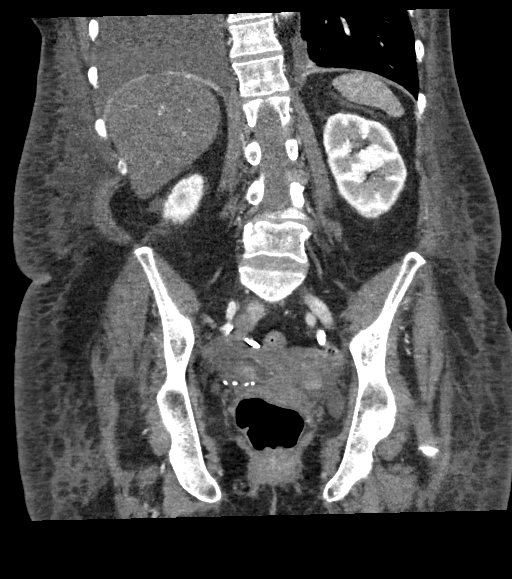
[im 50/148  bone]
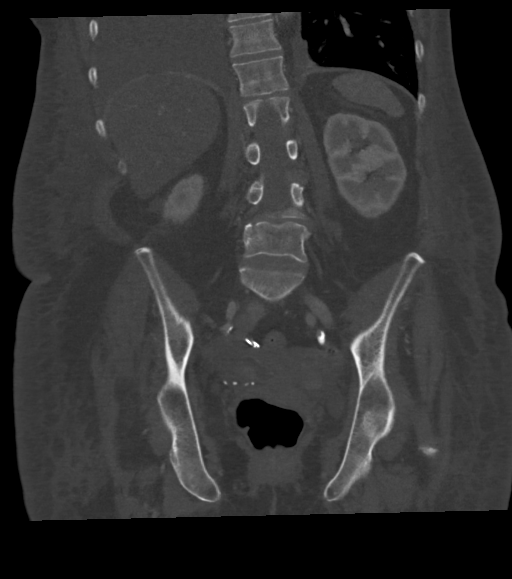
[im 99/148  soft-tissue]
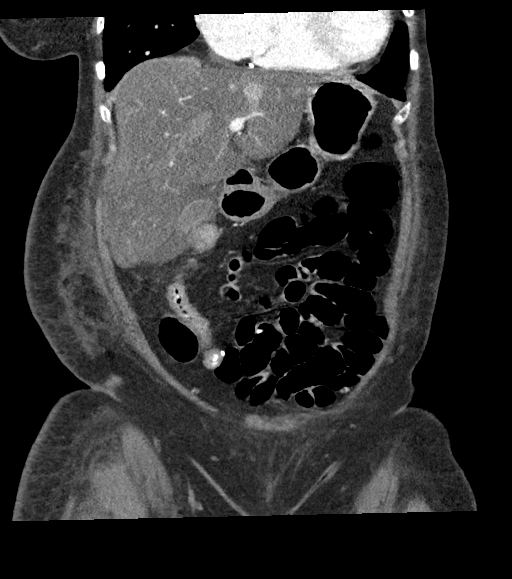

[10 of 46 positions shown; findings below may reference images not displayed]

FINDINGS: VASCULAR

Coronary calcifications.  Mild four-chamber cardiac enlargement.

Aorta: Coarse calcified atheromatous plaque. No aneurysm,
dissection, or high-grade stenosis.

Celiac: Heavily calcified ostial plaque resulting in origin stenosis
over length of less than 1 cm of possible hemodynamic significance,
patent distally.

SMA: Coarse calcified plaque extending over length of at least 7 cm,
without high-grade stenosis.

Renals: Duplicated on the left, co-dominant. This superior left
renal artery has calcified ostial plaque resulting in short segment
stenosis of at least mild severity. There is also ostial calcified
plaque involving the inferior left renal artery without definite
high-grade stenosis.

Single right renal artery with calcified ostial plaque, no
high-grade stenosis.

IMA: Diminutive, patent

Inflow: Eccentric calcified nonocclusive plaque through bilateral
iliac arterial systems without high-grade stenosis or occlusion. No
aneurysm.

Proximal Outflow: Bilateral common femoral and visualized portions
of the superficial and profunda femoral arteries are patent without
evidence of aneurysm, dissection, vasculitis or significant
stenosis.

Veins: Patent hepatic veins, portal vein, SMV. Diminutive or
occluded splenic vein. No significant gastric varices or
retroperitoneal collateral venous channels. Patent bilateral renal
veins, IVC and iliac venous system.

Review of the MIP images confirms the above findings.

NON-VASCULAR

Lower chest: Bilateral pleural effusions right greater than left.
Dependent atelectasis posteriorly in both lower lobes.

Hepatobiliary: Diffuse fatty infiltration without focal lesion.
Gallbladder is incompletely distended. No biliary ductal dilatation.

Pancreas: Unremarkable. No pancreatic ductal dilatation or
surrounding inflammatory changes.

Spleen: Small.  No focal lesion.

Adrenals/Urinary Tract: Normal adrenals. Normal renal parenchymal
enhancement. No hydronephrosis. Urinary bladder incompletely
distended.

Stomach/Bowel: Stomach is incompletely distended. Small bowel is
nondilated. Appendix not discretely identified. Scattered colonic
diverticula. Suspected mild circumferential wall thickening in the
distal ascending segment and a hepatic flexure extending into
proximal transverse segment as before, without evidence of
obstruction. Mild regional inflammatory/edematous changes. No
abscess. Colon is decompressed distally.

Lymphatic: No abdominal or pelvic adenopathy.

Reproductive: Uterus and bilateral adnexa are unremarkable. Tubal
ligation clips.

Other: Trace pelvic ascites.  No free air.

Musculoskeletal: Mild L4 superior endplate compression deformity as
before. No acute fracture or worrisome bone lesion.
IMPRESSION: VASCULAR

1. Extensive aortoiliac atherosclerosis ([id number]-170.0) without
stenosis or aneurysm.
2. Origin stenosis of the celiac axis. Patent superior and inferior
mesenteric arteries likely render this clinically insignificant.
3. Superior left renal artery ostial stenosis of indeterminate
hemodynamic significance.

NON-VASCULAR

1. Persistent wall thickening in the colon centered at the hepatic
flexure, without obstruction.
2. Progressive pleural effusions right greater than left
3. Fatty liver
4. Scattered colonic diverticula.

## 2019-09-17 ENCOUNTER — Ambulatory Visit: Payer: Medicare Other

## 2019-09-17 ENCOUNTER — Ambulatory Visit
Admission: RE | Admit: 2019-09-17 | Discharge: 2019-09-17 | Disposition: A | Discharge status: Home / Self Care | Payer: Medicare Other | Source: Ambulatory Visit | Attending: Radiation Oncology | Admitting: Radiation Oncology

## 2019-09-17 ENCOUNTER — Other Ambulatory Visit: Payer: Self-pay

## 2019-09-17 DIAGNOSIS — C155 Malignant neoplasm of lower third of esophagus: Secondary | ICD-10-CM | POA: Diagnosis not present

## 2019-09-18 ENCOUNTER — Ambulatory Visit
Admission: RE | Admit: 2019-09-18 | Discharge: 2019-09-18 | Disposition: A | Discharge status: Home / Self Care | Payer: Medicare Other | Source: Ambulatory Visit | Attending: Radiation Oncology | Admitting: Radiation Oncology

## 2019-09-18 ENCOUNTER — Other Ambulatory Visit: Payer: Self-pay

## 2019-09-18 DIAGNOSIS — C155 Malignant neoplasm of lower third of esophagus: Secondary | ICD-10-CM | POA: Diagnosis not present

## 2019-09-19 IMAGING — DX DG CHEST 1V PORT
1 series · 1 of 1 positions shown · non-contrast
Comparison: 05/02/2018, 04/21/2018, CT chest 04/21/2018

CLINICAL DATA: ETT placement

EXAM:
PORTABLE CHEST 1 VIEW

[chest ap]
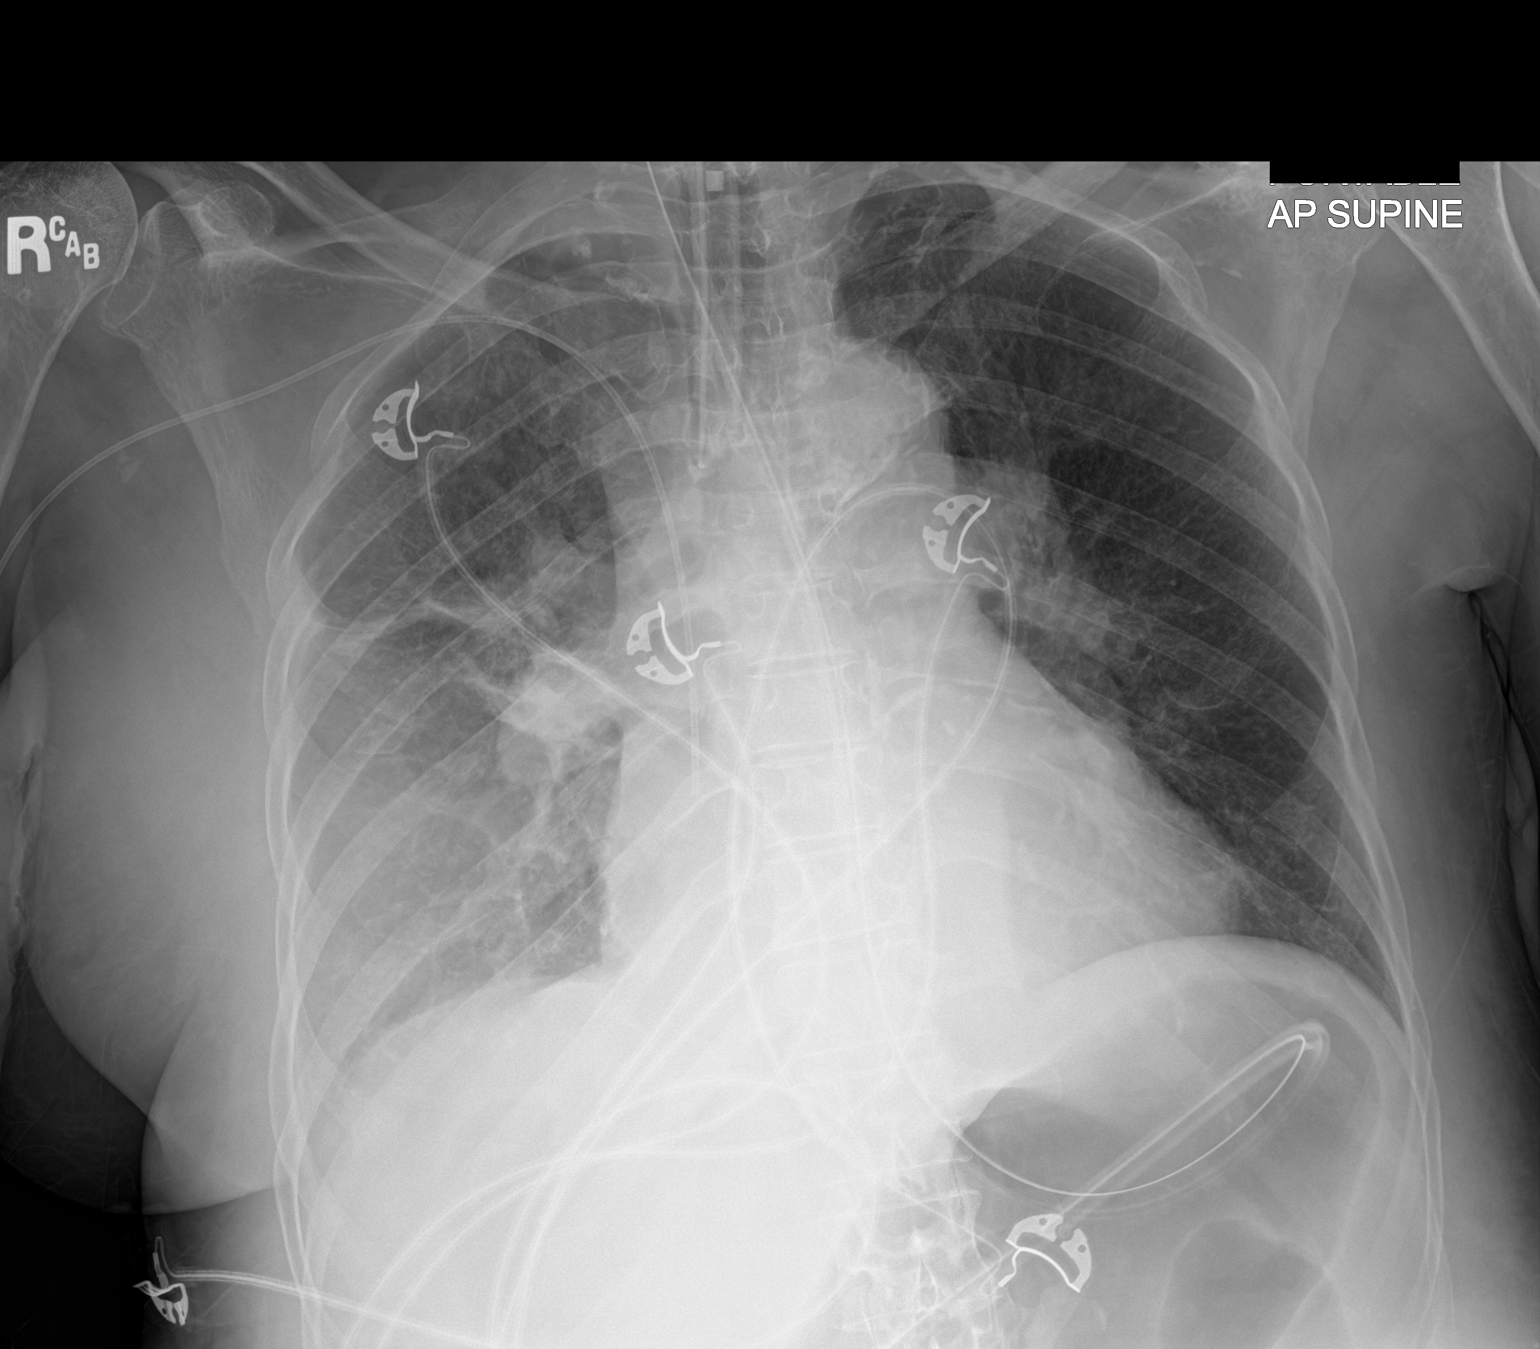

[1 of 1 positions shown; findings below may reference images not displayed]

FINDINGS: Endotracheal tube tip is about 12 mm superior to the carina.
Esophageal tube tip is below the diaphragm but non included on the
image. Right upper extremity catheter tip projects over the proximal
right atrium. Development of hazy right-sided thoracic opacity
likely due to layering effusion. Stable cardiomediastinal silhouette
with aortic atherosclerosis. Spiculated opacity in the right hilar
region without significant change.
IMPRESSION: 1. Endotracheal tube tip about 12 mm superior to carina. Esophageal
tube tip below the diaphragm but non included on the image. Right
upper extremity catheter tip projects over the proximal right
atrium.
2. Development of hazy opacity in the right thorax suspected to
represent layering pleural fluid. Alternatively, this could
represent asymmetric edema. Spiculated opacity in the right hilar
region is otherwise unchanged.

## 2019-09-19 IMAGING — DX DG ABDOMEN 1V
1 series · 1 of 1 positions shown · non-contrast
Comparison: CT abdomen 05/01/2018, radiograph 04/28/2018

CLINICAL DATA: NG tube placement

EXAM:
ABDOMEN - 1 VIEW

[abdomen supine]
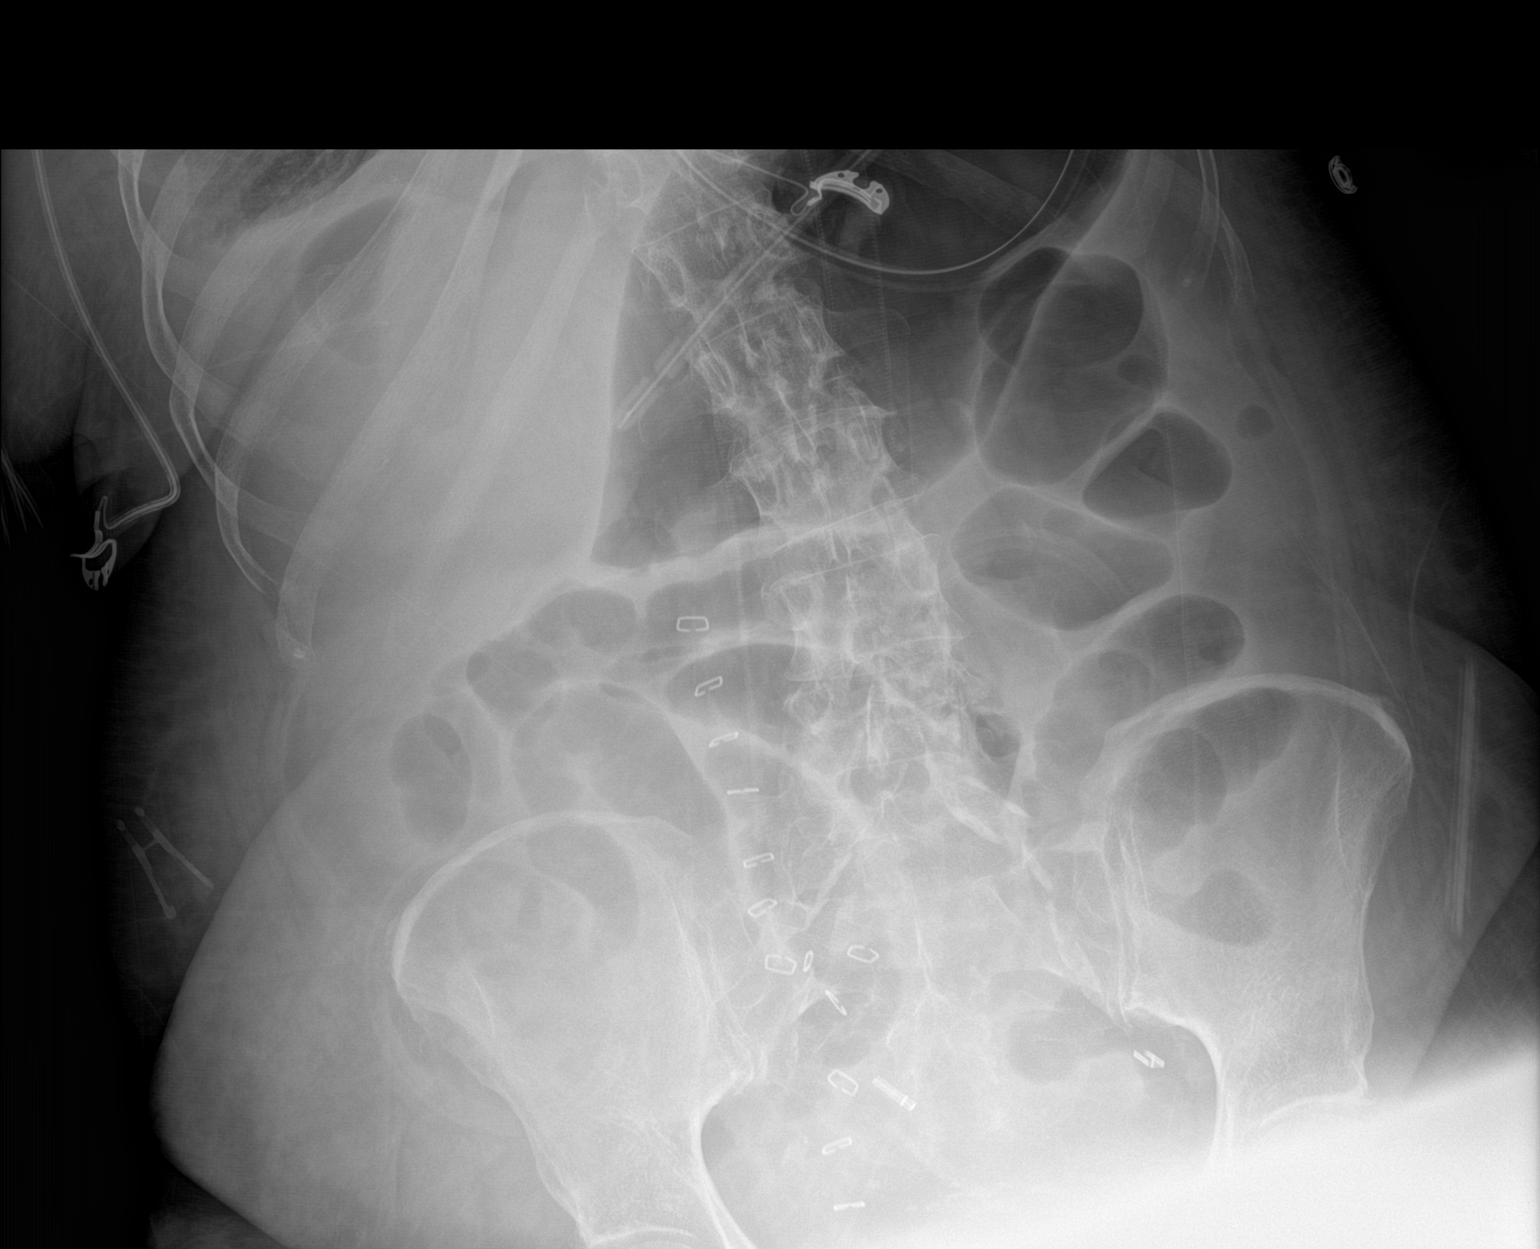

[1 of 1 positions shown; findings below may reference images not displayed]

FINDINGS: Esophageal tube tip overlies the distal stomach. Mild to moderate
gastric distention remains. Mild small bowel dilatation, possible
ileus. Pleural fluid at the right base. Surgical staples over the
abdomen.
IMPRESSION: Esophageal tube tip overlies the distal stomach. Mild gaseous
enlargement of stomach and small bowel suggestive of an ileus.

## 2019-09-20 IMAGING — DX DG CHEST 1V PORT
1 series · 1 of 1 positions shown · non-contrast
Comparison: 05/04/2018

CLINICAL DATA: Respiratory failure

EXAM:
PORTABLE CHEST 1 VIEW

[chest ap]
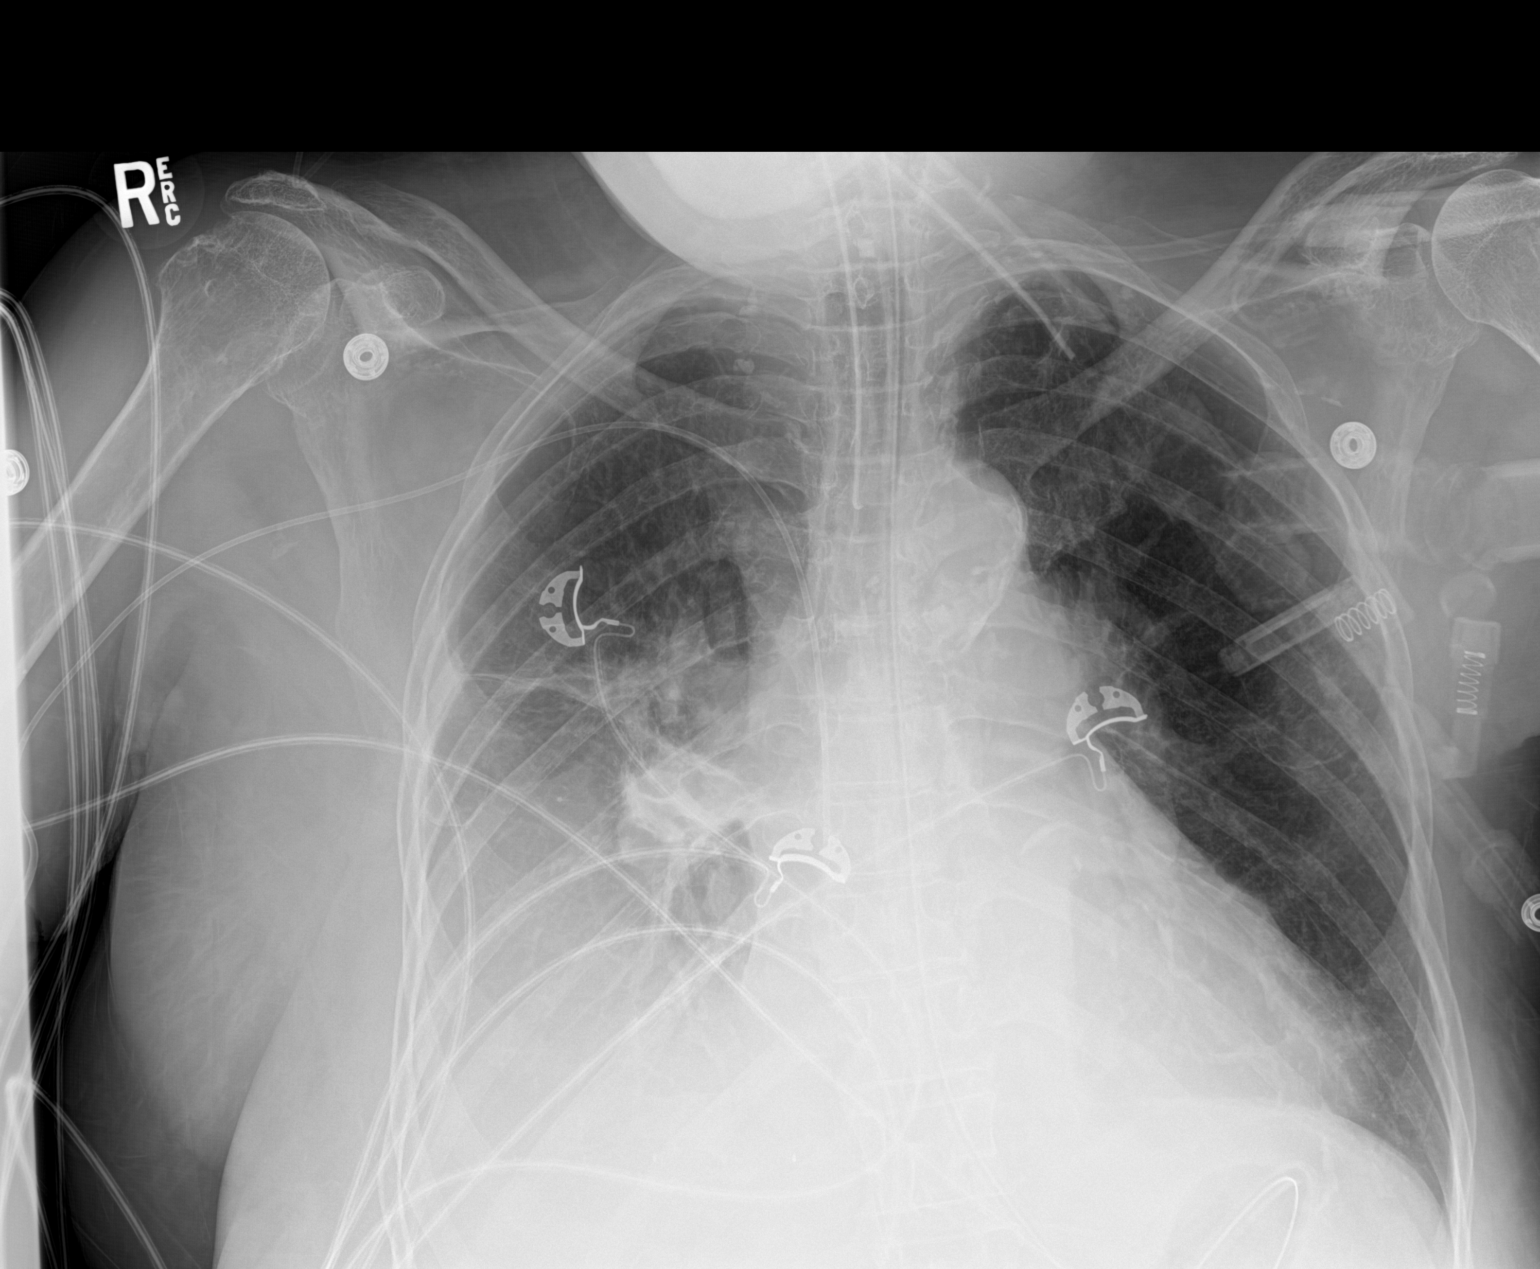

[1 of 1 positions shown; findings below may reference images not displayed]

FINDINGS: Cardiac shadow is stable. Endotracheal tube, nasogastric catheter
and right-sided PICC line are again seen and stable. Persistent
right-sided pleural effusion is noted but slightly increased in the
interval from the prior exam likely related to the semi upright
positioning. Stable changes superimposed over the right hilum and
right upper lobe are noted. No new focal infiltrate is seen.
IMPRESSION: Increasing right-sided effusion likely related to the semiupright
positioning.

Tubes and lines as described.

Stable right hilar and perihilar infiltrative changes.

## 2019-09-21 ENCOUNTER — Ambulatory Visit
Admission: RE | Admit: 2019-09-21 | Discharge: 2019-09-21 | Disposition: A | Discharge status: Home / Self Care | Payer: Medicare Other | Source: Ambulatory Visit | Attending: Radiation Oncology | Admitting: Radiation Oncology

## 2019-09-21 ENCOUNTER — Telehealth: Payer: Self-pay | Admitting: *Deleted

## 2019-09-21 ENCOUNTER — Other Ambulatory Visit: Payer: Self-pay

## 2019-09-21 ENCOUNTER — Inpatient Hospital Stay: Payer: Medicare Other

## 2019-09-21 DIAGNOSIS — C155 Malignant neoplasm of lower third of esophagus: Secondary | ICD-10-CM | POA: Diagnosis not present

## 2019-09-21 IMAGING — NM NM GI BLOOD LOSS
2 series · 12 of 12 positions shown · non-contrast
Comparison: CT 05/01/2018

CLINICAL DATA: GI bleed beginning 05/04/2018 over 6 hours.
Decreasing hemoglobin. Recent exploratory laparotomy with right
colectomy and right left ostomies no active bleed since 05/04/2018.
Possible blood clot around stoma..

EXAM:
NUCLEAR MEDICINE GASTROINTESTINAL BLEEDING SCAN
TECHNIQUE: Sequential abdominal images were obtained following intravenous
administration of Uc-QQm labeled red blood cells.
RADIOPHARMACEUTICALS:  20.8 mCi Uc-QQm pertechnetate in-vitro
labeled red cells.

[Series 1000: gi bleed hour 2 · 4.80mm/px · 6 of 60 frames shown]
[frame 6/60]
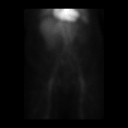
[frame 16/60]
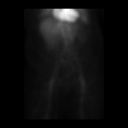
[frame 26/60]
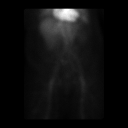
[frame 36/60]
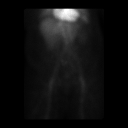
[frame 46/60]
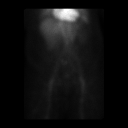
[frame 56/60]
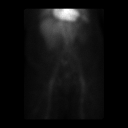

[Series 1000: gi bleed · 4.80mm/px · 6 of 60 frames shown]
[frame 6/60]
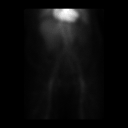
[frame 16/60]
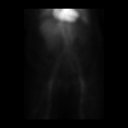
[frame 26/60]
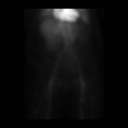
[frame 36/60]
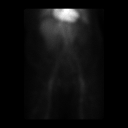
[frame 46/60]
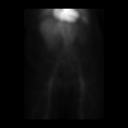
[frame 56/60]
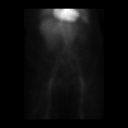

[12 of 12 positions shown; findings below may reference images not displayed]

FINDINGS: No evidence of progressive mobile radiotracer activity conforming to
bowel as there is no evidence of GI bleed.
IMPRESSION: Negative GI bleeding scan.

## 2019-09-21 MED ORDER — MEGESTROL ACETATE 625 MG/5ML PO SUSP: 625.0000 mg | Freq: Every day | ORAL | 0 refills | Status: DC

## 2019-09-21 NOTE — Telephone Encounter (Signed)
Brenda-please inform patient that I have sent a prescription for Megace to her pharmacy[facility].  Thanks GB

## 2019-09-21 NOTE — Progress Notes (Signed)
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  Patient completed radiation treatments today.  Followed by Dr. Rogue Bussing.    Spoke with patient via phone this pm for follow-up.  Patient reports that appetite is still about the same.  Has received new teeth but on the right side needs to be adjusted due to rubbing on gums causing pain.  Will go back to the dentist on 12/10 for adjustment.  Reports that she is eating soft foods (eggs, boiled chicken, fish, pinto beans, green beans, cheese, hamburger, greens).      Medications: reviewed  Labs: no new labs  Anthropometrics:   Weight 121 on 12/1 per [facility] stable from weight on 11/24 10/6 127 lb   NUTRITION DIAGNOSIS: Predicted suboptimal energy intake continues   INTERVENTION:  Patient to call Dr. Gust Brooms office today regarding appetite stimulant.   Encouraged ensure at least 3 per day.  Reviewed high calorie, high protein soft foods for patient to consume.  Patient has contact information    MONITORING, EVALUATION, GOAL: Patient will consume adequate calories and protein to prevent weight loss during treatment   NEXT VISIT: Jan 7th (Thursday)  Thedore Pickel B. Zenia Resides, [place], [place] Registered Dietitian [phone] (pager)

## 2019-09-21 NOTE — Telephone Encounter (Signed)
Patient informed of prescription sent to pharmacy

## 2019-09-21 NOTE — Telephone Encounter (Signed)
Patient called and stated that medicine for her appetite has not been sent to pharmacy

## 2019-09-24 ENCOUNTER — Other Ambulatory Visit: Payer: Self-pay

## 2019-09-25 ENCOUNTER — Other Ambulatory Visit: Payer: Self-pay

## 2019-09-25 ENCOUNTER — Inpatient Hospital Stay: Payer: Medicare Other

## 2019-09-25 ENCOUNTER — Inpatient Hospital Stay (HOSPITAL_BASED_OUTPATIENT_CLINIC_OR_DEPARTMENT_OTHER): Payer: Medicare Other | Admitting: Internal Medicine

## 2019-09-25 VITALS — BP 107/77 | HR 67 | Temp 99.7°F | Wt 122.0 lb

## 2019-09-25 DIAGNOSIS — C155 Malignant neoplasm of lower third of esophagus: Secondary | ICD-10-CM | POA: Diagnosis not present

## 2019-09-25 DIAGNOSIS — N184 Chronic kidney disease, stage 4 (severe): Secondary | ICD-10-CM

## 2019-09-25 DIAGNOSIS — D631 Anemia in chronic kidney disease: Secondary | ICD-10-CM

## 2019-09-25 DIAGNOSIS — U071 COVID-19: Secondary | ICD-10-CM | POA: Diagnosis not present

## 2019-09-25 DIAGNOSIS — J9601 Acute respiratory failure with hypoxia: Secondary | ICD-10-CM | POA: Diagnosis not present

## 2019-09-25 LAB — COMPREHENSIVE METABOLIC PANEL
ALT: 24 U/L (ref 0–44)
AST: 36 U/L (ref 15–41)
Albumin: 4.2 g/dL (ref 3.5–5.0)
Alkaline Phosphatase: 49 U/L (ref 38–126)
Anion gap: 9 (ref 5–15)
BUN: 44 mg/dL — ABNORMAL HIGH (ref 8–23)
CO2: 15 mmol/L — ABNORMAL LOW (ref 22–32)
Calcium: 9.4 mg/dL (ref 8.9–10.3)
Chloride: 110 mmol/L (ref 98–111)
Creatinine, Ser: 3.05 mg/dL — ABNORMAL HIGH (ref 0.44–1.00)
GFR calc Af Amer: 18 mL/min — ABNORMAL LOW (ref >60)
GFR calc non Af Amer: 15 mL/min — ABNORMAL LOW (ref >60)
Glucose, Bld: 105 mg/dL — ABNORMAL HIGH (ref 70–99)
Potassium: 4.3 mmol/L (ref 3.5–5.1)
Sodium: 134 mmol/L — ABNORMAL LOW (ref 135–145)
Total Bilirubin: 0.6 mg/dL (ref 0.3–1.2)
Total Protein: 8.4 g/dL — ABNORMAL HIGH (ref 6.5–8.1)

## 2019-09-25 LAB — CBC WITH DIFFERENTIAL/PLATELET
Abs Immature Granulocytes: 0.01 10*3/uL (ref 0.00–0.07)
Basophils Absolute: 0 10*3/uL (ref 0.0–0.1)
Basophils Relative: 1 %
Eosinophils Absolute: 0 10*3/uL (ref 0.0–0.5)
Eosinophils Relative: 0 %
HCT: 33.1 % — ABNORMAL LOW (ref 36.0–46.0)
Hemoglobin: 10.2 g/dL — ABNORMAL LOW (ref 12.0–15.0)
Immature Granulocytes: 0 %
Lymphocytes Relative: 22 %
Lymphs Abs: 1.2 10*3/uL (ref 0.7–4.0)
MCH: 30.9 pg (ref 26.0–34.0)
MCHC: 30.8 g/dL (ref 30.0–36.0)
MCV: 100.3 fL — ABNORMAL HIGH (ref 80.0–100.0)
Monocytes Absolute: 1.1 10*3/uL — ABNORMAL HIGH (ref 0.1–1.0)
Monocytes Relative: 21 %
Neutro Abs: 3.1 10*3/uL (ref 1.7–7.7)
Neutrophils Relative %: 56 %
Platelets: 185 10*3/uL (ref 150–400)
RBC: 3.3 MIL/uL — ABNORMAL LOW (ref 3.87–5.11)
RDW: 14.3 % (ref 11.5–15.5)
WBC: 5.4 10*3/uL (ref 4.0–10.5)
nRBC: 0 % (ref 0.0–0.2)

## 2019-09-25 IMAGING — DX DG ABD PORTABLE 1V
2 series · 2 of 2 positions shown · non-contrast
Comparison: 05/04/2018

CLINICAL DATA: Postoperative ileus

EXAM:
PORTABLE ABDOMEN - 1 VIEW

[abdomen kub (1 of 2)]
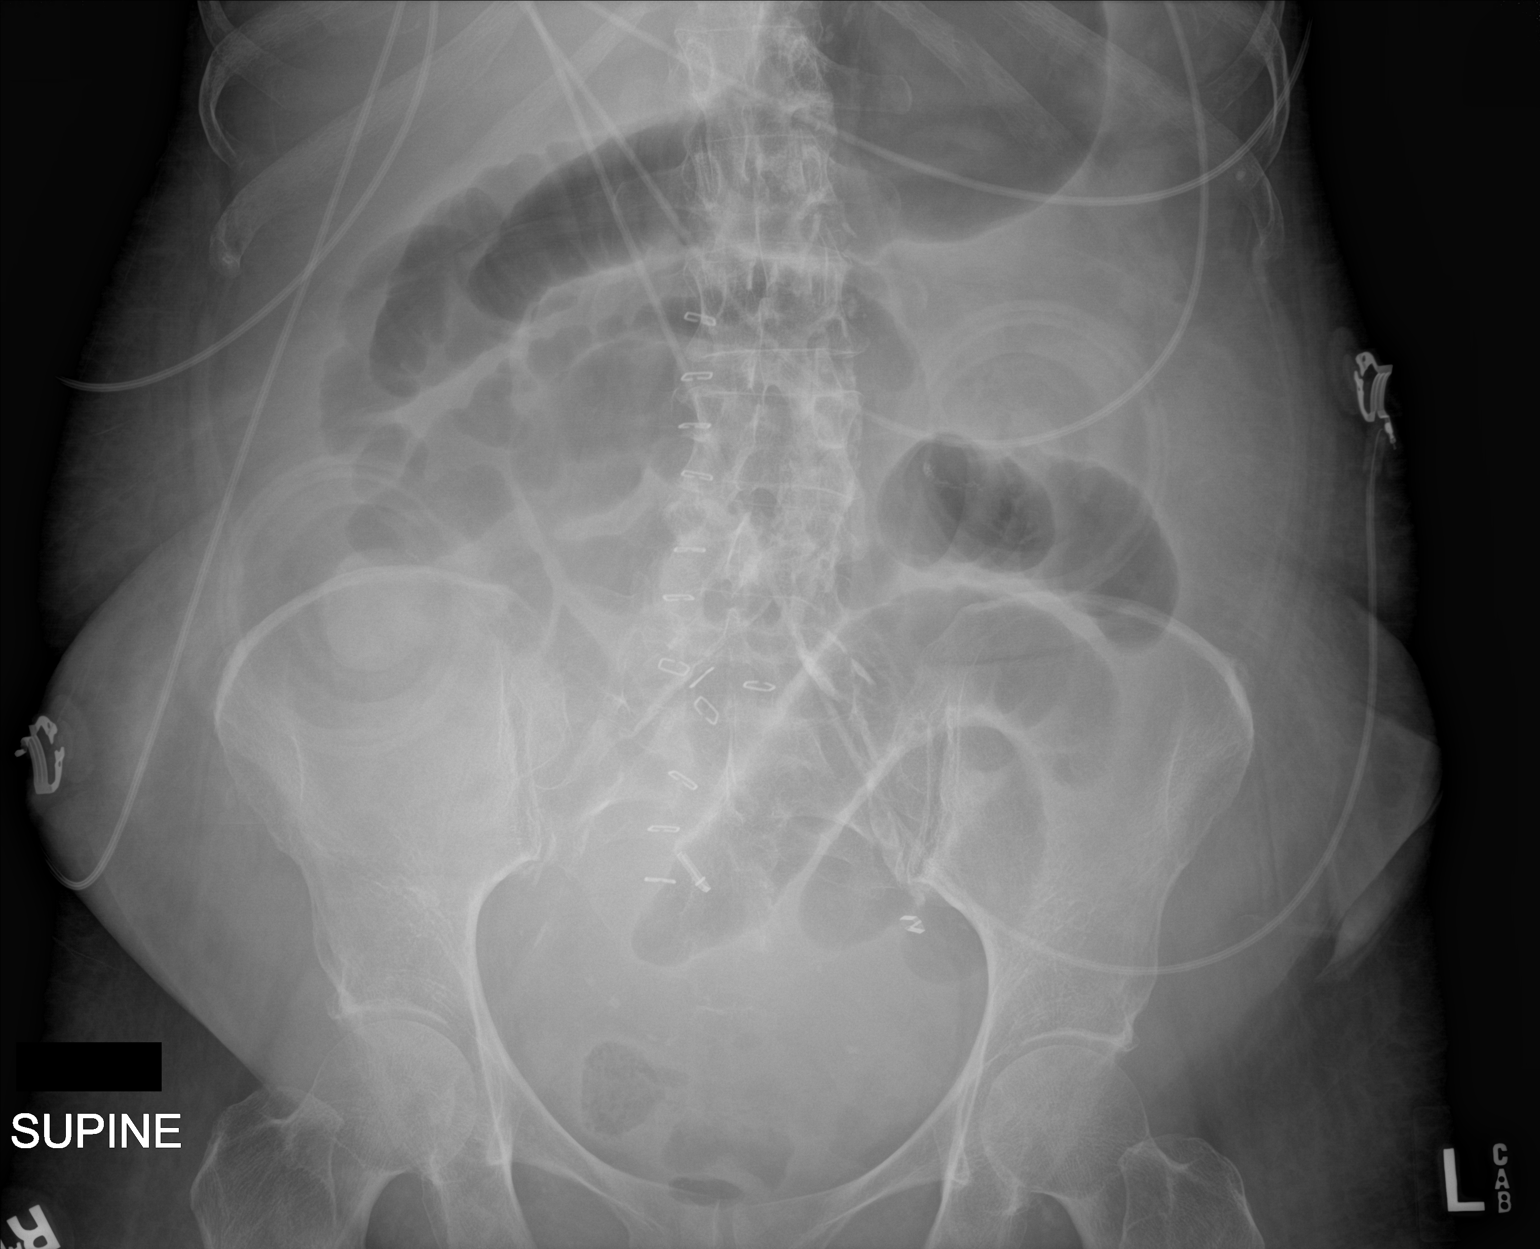

[abdomen kub (2 of 2)]
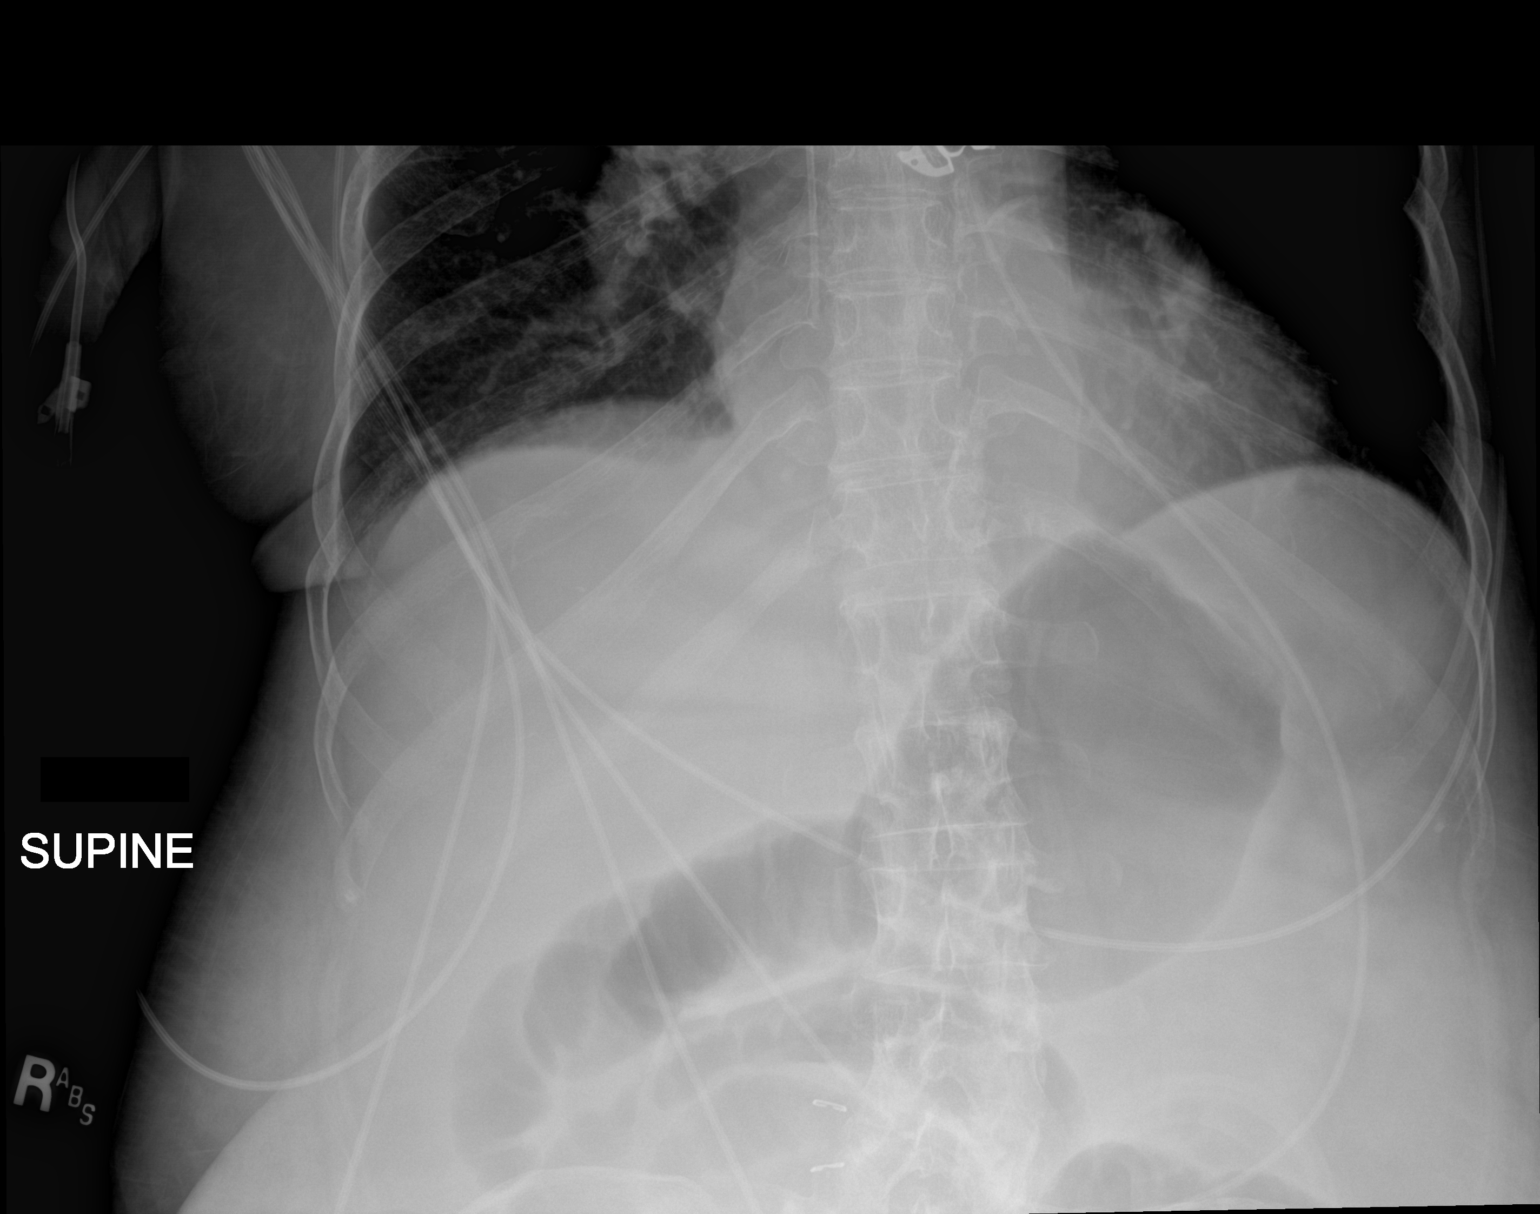

[2 of 2 positions shown; findings below may reference images not displayed]

FINDINGS: Multiple dilated loops of small bowel are identified in the mid
abdomen. Ostomies are identified bilaterally stable from the prior
exam. Postsurgical changes are again seen. Paucity of colonic gas is
noted.
IMPRESSION: Small-bowel dilatation consistent with postoperative ileus.
Correlation with the physical exam is recommended. No free air is
noted.

## 2019-09-25 NOTE — Assessment & Plan Note (Addendum)
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  Currently s/p definitive radiation [dec 7th 2020].  Clinically stable; plan proceed with PET scan in February 2020. Will order scan at next visit.   #Weight loss-multifactorial; esophagus malignancy/radiation; currently on Megace.  Stable.  Appreciate nutrition evaluation; recommendations from [facility].   # ? ACTIVE MULTIPLE MYELOMA-given the severe anemia; given the 5% plasma cells/renal failure [no hypercalcemia or bone lesions on recent PET scan].  Hemoglobin today is 10.  We will also await PET scan in the next couple of months.  Discussed that if progressive anemia noted then trial of dexamethasone-Velcade could be considered.  # URI-nasal stuffiness; pulse ox 98% on room air.  Recommend Claritin-D/Mucinex.  #Chronic renal insufficiency-stage- IV; slight worsening.  Recommend neprhology evaluation.   # DISPOSITION: # referral to nephrology re: CKD # follow up in 4 weeks MD- lab- cbc/cmp; MM panel;K/L light chains-  Dr.B

## 2019-09-25 NOTE — Patient Instructions (Signed)
#   Claritin D one a day [Over the counter] # Mucinex twice a day [Over the counter]

## 2019-09-25 NOTE — Progress Notes (Signed)
Patient stated that she had been having a headache and sinus pressure with some productive cough for the past week. Patient also stated that she gets dizzy when she gets up. Patient denied fever, chills, nausea, vomiting, diarrhea or constipation.

## 2019-09-25 NOTE — Progress Notes (Signed)
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 A. LUNG MASS, RIGHT; CT-GUIDED BIOPSY: - ADENOCARCINOMA, ACINAR AND PAPILLARY MORPHOLOGIES. S/p RT [finished Dec 2017] s/p RT [DEC 2017]; AUG 2019- PET-right middle lobe radiation changes no evidence of recurrence.  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  T1b- submucosal involvement; N0- stage I [[name]; [facility]]; unable to proceed with Endo mucosal resection-because of extensive disease [Dr.Branch; [facility]]; recommend definitive radiation [finished  RT- 09/21/2019]  #September 2020 worsening macrocytic anemia--bone marrow biopsy shows plasma cells up to 5%; unlikely to explain patient's anemia.  MDS is still a possibility-if anemia worsens would recommend peripheral blood NGS.   # 2000-  Left breast cancer [T3N1- stage III] mastecomy s/p RT; s/p chemo; Tamoxifen  # NOV 111 2017- Mol testing [RML- adeno ca]  # 2019-July-aug 2019-ischemic colitis status post right hemicolectomy [Dr.[name]]; acute renal failure;   -----------------------------------------------------------------    DIAGNOSIS: RML Lung ca adeno [stage I] #squamous cell carcinoma esophagus [question stage]   CURRENT/MOST RECENT THERAPY: surveillance       Breast cancer in female [facility])  07/24/1999 Initial Diagnosis   Breast cancer in female ([place]) T3 N1 M0 tumor ER/PR positive   08/24/1999 -  Anti-estrogen oral therapy   Started tamoxifen   08/24/1999 Surgery   Status post modified radical mastectomy of left breast    Chemotherapy      Radiation Therapy     11/24/2003 - 02/20/2009 Anti-estrogen oral therapy   Started Aromasin   Carcinoma of overlapping sites of left breast in female, estrogen receptor positive ([place])  06/22/2016 Initial Diagnosis   Cancer of overlapping sites of left female breast ([place])   Malignant neoplasm of upper lobe of right lung ([place])  Malignant neoplasm of lower third of esophagus ([place])  05/01/2019 Initial Diagnosis   Malignant neoplasm of lower third of esophagus ([place])     INTERVAL HISTORY:  ELANIE HAMMITT 63 y.o.  female pleasant patient above history of multiple malignancies including most recent stage I squamous cell cancer of esophagus currently on radiation; and severe anemia is here for follow-up.  Patient finished radiation 4 days ago; on December 11.  Patient is currently on Megace.  Her appetite is improving.  She has not lost any weight.  Difficulty swallowing is not getting worse.  Patient complains of fatigue.  She also complains of cough shortness of breath on exertion.  No fevers.  Dry cough.  Complains of nasal stuffiness.  She has not tried any over-the-counter medications.  Review of Systems  Constitutional: Positive for malaise/fatigue and weight loss. Negative for chills, diaphoresis and fever.  HENT: Negative for  nosebleeds and sore throat.   Eyes: Negative for double vision.  Respiratory: Positive for cough and shortness of breath. Negative for hemoptysis, sputum production  and wheezing.   Cardiovascular: Negative for chest pain, palpitations, orthopnea and leg swelling.  Gastrointestinal: Negative for abdominal pain, blood in stool, constipation, diarrhea, heartburn, melena, nausea and vomiting.       Dysphagia.  Genitourinary: Negative for dysuria, frequency and urgency.  Musculoskeletal: Negative for back pain and joint pain.  Skin: Negative.  Negative for itching and rash.  Neurological: Negative for dizziness, tingling, focal weakness, weakness and headaches.  Endo/Heme/Allergies: Does not bruise/bleed easily.  Psychiatric/Behavioral: Negative for depression. The patient is not nervous/anxious and does not have insomnia.       PAST MEDICAL HISTORY :  Past Medical History:  Diagnosis Date  . Anxiety   . Arthritis   . Breast cancer ([place]) 2000  . Cancer ([place]) left    breast cancer 2000, chemo tx's with total mastectomy and lymph nodes resected.   . Cancer of right lung ([place]) 02/21/2016   rad tx's.   . CHF (congestive heart failure) ([place])   . COPD (chronic obstructive pulmonary disease) ([place])   . Dependence on supplemental oxygen   . Depression   . Heart murmur   . Hypertension   . Lung nodule   . Lymphedema   . Personal history of chemotherapy   . Personal history of radiation therapy   . Shortness of breath dyspnea    with exertion  . Status post chemotherapy 2001   left breast cancer  . Status post radiation therapy 2001   left breast cancer    PAST SURGICAL HISTORY :   Past Surgical History:  Procedure Laterality Date  . Breast Biospy Left    [facility]  . BREAST SURGERY    . COLONOSCOPY N/A 04/30/2018   Procedure: COLONOSCOPY;  Surgeon: Virgel Manifold, MD;  Location: [facility] ENDOSCOPY;  Service: Endoscopy;  Laterality: N/A;  . COLONOSCOPY N/A 07/22/2018   Procedure: COLONOSCOPY;  Surgeon: Virgel Manifold, MD;  Location: [facility] ENDOSCOPY;  Service: Endoscopy;  Laterality: N/A;  . DILATION AND CURETTAGE OF UTERUS    . ELECTROMAGNETIC  NAVIGATION BROCHOSCOPY Right 04/11/2016   Procedure: ELECTROMAGNETIC NAVIGATION BRONCHOSCOPY;  Surgeon: Vilinda Boehringer, MD;  Location: [facility] ORS;  Service: Cardiopulmonary;  Laterality: Right;  . ESOPHAGOGASTRODUODENOSCOPY N/A 07/22/2018   Procedure: ESOPHAGOGASTRODUODENOSCOPY (EGD);  Surgeon: Virgel Manifold, MD;  Location[facility] ENDOSCOPY;  Service: Endoscopy;  Laterality: N/A;  . ESOPHAGOGASTRODUODENOSCOPY (EGD) WITH PROPOFOL N/A 05/07/2018   Procedure: ESOPHAGOGASTRODUODENOSCOPY (EGD) WITH PROPOFOL;  Surgeon: Lucilla Lame, MD;  Location: [facility] ENDOSCOPY;  Service: Endoscopy;  Laterality: N/A;  . ESOPHAGOGASTRODUODENOSCOPY (EGD) WITH PROPOFOL N/A 04/24/2019   Procedure: ESOPHAGOGASTRODUODENOSCOPY (EGD) WITH PROPOFOL;  Surgeon: Jonathon Bellows, MD;  Location: [facility] ENDOSCOPY;  Service: Gastroenterology;  Laterality: N/A;  . EUS N/A 05/07/2019   Procedure: FULL UPPER ENDOSCOPIC ULTRASOUND (EUS) RADIAL;  Surgeon: Jola Schmidt, MD;  Location: [facility] ENDOSCOPY;  Service: Endoscopy;  Laterality: N/A;  . history of colonoscopy]    . ILEOSCOPY N/A 07/22/2018   Procedure: ILEOSCOPY THROUGH STOMA;  Surgeon: Virgel Manifold, MD;  Location: [facility] ENDOSCOPY;  Service: Endoscopy;  Laterality: N/A;  . ILEOSTOMY    . ILEOSTOMY N/A 09/08/2018   Procedure: ILEOSTOMY REVISION POSSIBLE CREATION;  Surgeon: Herbert Pun, MD;  Location: [facility] ORS;  Service: General;  Laterality: N/A;  . ILEOSTOMY CLOSURE N/A 08/15/2018   Procedure: DILATION OF ILEOSTOMY STRICTURE;  Surgeon: Herbert Pun, MD;  Location: [facility] ORS;  Service: General;  Laterality: N/A;  . LAPAROTOMY Right 05/04/2018   Procedure: EXPLORATORY LAPAROTOMY right colectomy right and left ostomy;  Surgeon: Herbert Pun, MD;  Location: [facility] ORS;  Service: General;  Laterality: Right;  . LUNG BIOPSY    . MASTECTOMY Left    2000, [facility]  . ROTATOR CUFF REPAIR Right    [facility]    FAMILY HISTORY :   Family History  Problem Relation Age of Onset   . Breast cancer Mother 1  . Cancer Mother        Breast   . Cirrhosis Father   . Breast cancer Paternal Aunt 24  . Cancer Maternal Aunt        Breast     SOCIAL HISTORY:   Social History   Tobacco Use  . Smoking status: Former Smoker    Packs/day: 0.50    Years: 20.00    Pack years: 10.00    Types: Cigarettes    Quit date: 07/02/2012    Years since quitting: 7.2  . Smokeless tobacco: Current User    Types: Snuff  . Tobacco comment: quit 2014  Substance Use Topics  . Alcohol use: Not Currently    Comment: Occasionally  . Drug use: No    ALLERGIES:  has No Known Allergies.  MEDICATIONS:  Current Outpatient Medications  Medication Sig Dispense Refill  . acetaminophen (TYLENOL) 325 MG tablet Take 2 tablets (650 mg total) by mouth every 6 (six) hours as needed for mild pain (or Fever >/= 101).    [name] albuterol (PROVENTIL HFA;VENTOLIN HFA) 108 (90 Base) MCG/ACT inhaler Inhale 2 puffs into the lungs every 6 (six) hours as needed for wheezing or shortness of breath. 1 Inhaler 2  . calcium-vitamin D (OSCAL WITH D) 250-125 MG-UNIT tablet Take 1 tablet by mouth daily.    . carvedilol (COREG) 3.125 MG tablet Take 3.125 mg by mouth 2 (two) times daily with a meal.    . citalopram (CELEXA) 40 MG tablet Take 40 mg by mouth daily.    . ferrous sulfate 325 (65 FE) MG tablet Take 1 tablet (325 mg total) by mouth 2 (two) times daily with a meal. 60 tablet 3  . Fluticasone-Umeclidin-Vilant 100-62.5-25 MCG/INH AEPB Inhale 1 puff into the lungs daily.     . furosemide (LASIX) 20 MG tablet Take 1 tablet (20 mg total) by mouth daily. And additional '20mg'$  dose as needed (Patient taking differently: Take 20-40 mg by mouth See admin instructions. Take 1 tablet ('20mg'$ ) by mouth daily - may take a second tablet ('20mg'$ ) by mouth daily as needed for severe swelling) 40 tablet 5  . guaiFENesin-dextromethorphan (ROBITUSSIN DM) 100-10 MG/5ML syrup Take 5 mLs by mouth every 4 (four) hours as needed for cough.     . loperamide (IMODIUM A-D) 2 MG tablet Take 1 tablet (2 mg total) by mouth as needed for diarrhea or loose stools. 30 tablet 0  . megestrol (MEGACE ES) 625 MG/5ML suspension Take 5 mLs (625 mg total) by mouth daily. 150 mL 0  . Multiple Vitamin (MULTIVITAMIN WITH MINERALS) TABS tablet Take 1 tablet by mouth daily. 30 tablet 1  . pantoprazole (PROTONIX) 40 MG tablet Take 1 tablet (40 mg total) by mouth 2 (two) times daily before a meal. 60 tablet 0  . potassium chloride (K-DUR) 10 MEQ tablet Take 10 mEq by mouth daily.     . sucralfate (CARAFATE) 1 GM/10ML suspension Take 10 mLs (1 g total) by mouth 4 (  four) times daily. 1200 mL 2   No current facility-administered medications for this visit.    PHYSICAL EXAMINATION: ECOG PERFORMANCE STATUS: 1 - Symptomatic but completely ambulatory  BP 107/77 (BP Location: Right Arm, Patient Position: Sitting)   Pulse 67   Temp 99.7 F (37.6 C) (Tympanic)   Wt 122 lb (55.3 kg)   BMI 19.69 kg/m   Filed Weights   09/25/19 1112  Weight: 122 lb (55.3 kg)    Physical Exam  Constitutional: She is oriented to person, place, and time.  Thin built moderately nourished female patient.  She is walking herself.  She is alone.  HENT:  Head: Normocephalic and atraumatic.  Mouth/Throat: Oropharynx is clear and moist. No oropharyngeal exudate.  Eyes: Pupils are equal, round, and reactive to light.  Cardiovascular: Normal rate and regular rhythm.  Pulmonary/Chest: No respiratory distress. She has no wheezes.  Decreased breath sounds bilaterally.  No wheeze or crackles.  Abdominal: Soft. Bowel sounds are normal. She exhibits no distension and no mass. There is no abdominal tenderness. There is no rebound and no guarding.  Musculoskeletal:        General: No tenderness or edema. Normal range of motion.     Cervical back: Normal range of motion and neck supple.  Neurological: She is alert and oriented to person, place, and time.  Skin: Skin is warm.   Psychiatric: Affect normal.       LABORATORY DATA:  I have reviewed the data as listed    Component Value Date/Time   NA 134 (L) 09/25/2019 1025   NA 132 (L) 02/09/2015 1100   K 4.3 09/25/2019 1025   K 3.8 02/09/2015 1100   CL 110 09/25/2019 1025   CL 95 (L) 02/09/2015 1100   CO2 15 (L) 09/25/2019 1025   CO2 29 02/09/2015 1100   GLUCOSE 105 (H) 09/25/2019 1025   GLUCOSE 105 (H) 02/09/2015 1100   BUN 44 (H) 09/25/2019 1025   BUN 16 02/09/2015 1100   CREATININE 3.05 (H) 09/25/2019 1025   CREATININE 0.81 02/09/2015 1100   CALCIUM 9.4 09/25/2019 1025   CALCIUM 9.1 02/09/2015 1100   PROT 8.4 (H) 09/25/2019 1025   PROT 7.7 02/09/2015 1100   ALBUMIN 4.2 09/25/2019 1025   ALBUMIN 4.3 02/09/2015 1100   AST 36 09/25/2019 1025   AST 29 02/09/2015 1100   ALT 24 09/25/2019 1025   ALT 20 02/09/2015 1100   ALKPHOS 49 09/25/2019 1025   ALKPHOS 69 02/09/2015 1100   BILITOT 0.6 09/25/2019 1025   BILITOT 0.9 02/09/2015 1100   GFRNONAA 15 (L) 09/25/2019 1025   GFRNONAA >60 02/09/2015 1100   GFRAA 18 (L) 09/25/2019 1025   GFRAA >60 02/09/2015 1100    No results found for: SPEP, UPEP  Lab Results  Component Value Date   WBC 5.4 09/25/2019   NEUTROABS 3.1 09/25/2019   HGB 10.2 (L) 09/25/2019   HCT 33.1 (L) 09/25/2019   MCV 100.3 (H) 09/25/2019   PLT 185 09/25/2019      Chemistry      Component Value Date/Time   NA 134 (L) 09/25/2019 1025   NA 132 (L) 02/09/2015 1100   K 4.3 09/25/2019 1025   K 3.8 02/09/2015 1100   CL 110 09/25/2019 1025   CL 95 (L) 02/09/2015 1100   CO2 15 (L) 09/25/2019 1025   CO2 29 02/09/2015 1100   BUN 44 (H) 09/25/2019 1025   BUN 16 02/09/2015 1100   CREATININE 3.05 (H) 09/25/2019 1025  CREATININE 0.81 02/09/2015 1100      Component Value Date/Time   CALCIUM 9.4 09/25/2019 1025   CALCIUM 9.1 02/09/2015 1100   ALKPHOS 49 09/25/2019 1025   ALKPHOS 69 02/09/2015 1100   AST 36 09/25/2019 1025   AST 29 02/09/2015 1100   ALT 24 09/25/2019  1025   ALT 20 02/09/2015 1100   BILITOT 0.6 09/25/2019 1025   BILITOT 0.9 02/09/2015 1100       RADIOGRAPHIC STUDIES: I have personally reviewed the radiological images as listed and agreed with the findings in the report. No results found.   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  Currently s/p definitive radiation [dec 7th 2020].  Clinically stable; plan proceed with PET scan in February 2020. Will order scan at next visit.   #Weight loss-multifactorial; esophagus malignancy/radiation; currently on Megace.  Stable.  Appreciate nutrition evaluation; recommendations from [facility].   # ? ACTIVE MULTIPLE MYELOMA-given the severe anemia; given the 5% plasma cells/renal failure [no hypercalcemia or bone lesions on recent PET scan].  Hemoglobin today is 10.  We will also await PET scan in the next couple of months.  Discussed that if progressive anemia noted then trial of dexamethasone-Velcade could be considered.  # URI-nasal stuffiness; pulse ox 98% on room air.  Recommend Claritin-D/Mucinex.  #Chronic renal insufficiency-stage- IV; slight worsening.  Recommend neprhology evaluation.   # DISPOSITION: # referral to nephrology re: CKD # follow up in 4 weeks MD- lab- cbc/cmp; MM panel;K/L light chains-  Dr.B     Orders Placed This Encounter  Procedures  . Ambulatory referral to Nephrology    Referral Priority:   Routine    Referral Type:   Consultation    Referral Reason:   Specialty Services Required    Referred to Provider:   Lavonia Dana, MD    Requested Specialty:   Nephrology    Number of Visits Requested:   1   All questions were answered. The patient knows to call the clinic with any problems, questions or concerns.      [name], MD 09/25/2019 1:04 PM

## 2019-09-26 ENCOUNTER — Encounter: Payer: Self-pay | Admitting: Emergency Medicine

## 2019-09-26 ENCOUNTER — Inpatient Hospital Stay
Admission: EM | Admit: 2019-09-26 | Discharge: 2019-10-02 | DRG: 177 | Disposition: A | Discharge status: Home / Self Care | Payer: Medicare Other | Attending: Family Medicine | Admitting: Family Medicine

## 2019-09-26 ENCOUNTER — Other Ambulatory Visit: Payer: Self-pay

## 2019-09-26 ENCOUNTER — Emergency Department: Payer: Medicare Other

## 2019-09-26 DIAGNOSIS — C155 Malignant neoplasm of lower third of esophagus: Secondary | ICD-10-CM | POA: Diagnosis present

## 2019-09-26 DIAGNOSIS — Z85118 Personal history of other malignant neoplasm of bronchus and lung: Secondary | ICD-10-CM

## 2019-09-26 DIAGNOSIS — Z8501 Personal history of malignant neoplasm of esophagus: Secondary | ICD-10-CM

## 2019-09-26 DIAGNOSIS — J1289 Other viral pneumonia: Secondary | ICD-10-CM | POA: Diagnosis present

## 2019-09-26 DIAGNOSIS — J9601 Acute respiratory failure with hypoxia: Secondary | ICD-10-CM | POA: Diagnosis present

## 2019-09-26 DIAGNOSIS — I10 Essential (primary) hypertension: Secondary | ICD-10-CM | POA: Diagnosis present

## 2019-09-26 DIAGNOSIS — I130 Hypertensive heart and chronic kidney disease with heart failure and stage 1 through stage 4 chronic kidney disease, or unspecified chronic kidney disease: Secondary | ICD-10-CM | POA: Diagnosis present

## 2019-09-26 DIAGNOSIS — N179 Acute kidney failure, unspecified: Secondary | ICD-10-CM

## 2019-09-26 DIAGNOSIS — K2970 Gastritis, unspecified, without bleeding: Secondary | ICD-10-CM | POA: Diagnosis present

## 2019-09-26 DIAGNOSIS — Z7951 Long term (current) use of inhaled steroids: Secondary | ICD-10-CM

## 2019-09-26 DIAGNOSIS — E878 Other disorders of electrolyte and fluid balance, not elsewhere classified: Secondary | ICD-10-CM | POA: Diagnosis present

## 2019-09-26 DIAGNOSIS — R079 Chest pain, unspecified: Secondary | ICD-10-CM

## 2019-09-26 DIAGNOSIS — N183 Chronic kidney disease, stage 3 unspecified: Secondary | ICD-10-CM

## 2019-09-26 DIAGNOSIS — F419 Anxiety disorder, unspecified: Secondary | ICD-10-CM | POA: Diagnosis present

## 2019-09-26 DIAGNOSIS — E872 Acidosis: Secondary | ICD-10-CM | POA: Diagnosis present

## 2019-09-26 DIAGNOSIS — J988 Other specified respiratory disorders: Secondary | ICD-10-CM | POA: Diagnosis present

## 2019-09-26 DIAGNOSIS — Z79899 Other long term (current) drug therapy: Secondary | ICD-10-CM

## 2019-09-26 DIAGNOSIS — Z803 Family history of malignant neoplasm of breast: Secondary | ICD-10-CM

## 2019-09-26 DIAGNOSIS — C50919 Malignant neoplasm of unspecified site of unspecified female breast: Secondary | ICD-10-CM | POA: Diagnosis present

## 2019-09-26 DIAGNOSIS — Z853 Personal history of malignant neoplasm of breast: Secondary | ICD-10-CM

## 2019-09-26 DIAGNOSIS — U071 COVID-19: Principal | ICD-10-CM

## 2019-09-26 DIAGNOSIS — I5022 Chronic systolic (congestive) heart failure: Secondary | ICD-10-CM | POA: Diagnosis present

## 2019-09-26 DIAGNOSIS — J449 Chronic obstructive pulmonary disease, unspecified: Secondary | ICD-10-CM | POA: Diagnosis present

## 2019-09-26 DIAGNOSIS — I5023 Acute on chronic systolic (congestive) heart failure: Secondary | ICD-10-CM | POA: Diagnosis present

## 2019-09-26 DIAGNOSIS — Z9221 Personal history of antineoplastic chemotherapy: Secondary | ICD-10-CM

## 2019-09-26 DIAGNOSIS — F329 Major depressive disorder, single episode, unspecified: Secondary | ICD-10-CM | POA: Diagnosis present

## 2019-09-26 DIAGNOSIS — N189 Chronic kidney disease, unspecified: Secondary | ICD-10-CM

## 2019-09-26 DIAGNOSIS — Z932 Ileostomy status: Secondary | ICD-10-CM

## 2019-09-26 DIAGNOSIS — N184 Chronic kidney disease, stage 4 (severe): Secondary | ICD-10-CM | POA: Diagnosis present

## 2019-09-26 DIAGNOSIS — Z923 Personal history of irradiation: Secondary | ICD-10-CM

## 2019-09-26 DIAGNOSIS — J440 Chronic obstructive pulmonary disease with acute lower respiratory infection: Secondary | ICD-10-CM | POA: Diagnosis present

## 2019-09-26 DIAGNOSIS — D631 Anemia in chronic kidney disease: Secondary | ICD-10-CM

## 2019-09-26 DIAGNOSIS — Z9012 Acquired absence of left breast and nipple: Secondary | ICD-10-CM

## 2019-09-26 DIAGNOSIS — Z720 Tobacco use: Secondary | ICD-10-CM

## 2019-09-26 DIAGNOSIS — E876 Hypokalemia: Secondary | ICD-10-CM | POA: Diagnosis present

## 2019-09-26 LAB — TROPONIN I (HIGH SENSITIVITY): Troponin I (High Sensitivity): 31 ng/L — ABNORMAL HIGH (ref <18)

## 2019-09-26 LAB — BASIC METABOLIC PANEL
Anion gap: 13 (ref 5–15)
BUN: 61 mg/dL — ABNORMAL HIGH (ref 8–23)
CO2: 12 mmol/L — ABNORMAL LOW (ref 22–32)
Calcium: 9.7 mg/dL (ref 8.9–10.3)
Chloride: 106 mmol/L (ref 98–111)
Creatinine, Ser: 4.5 mg/dL — ABNORMAL HIGH (ref 0.44–1.00)
GFR calc Af Amer: 11 mL/min — ABNORMAL LOW (ref >60)
GFR calc non Af Amer: 10 mL/min — ABNORMAL LOW (ref >60)
Glucose, Bld: 121 mg/dL — ABNORMAL HIGH (ref 70–99)
Potassium: 3.9 mmol/L (ref 3.5–5.1)
Sodium: 131 mmol/L — ABNORMAL LOW (ref 135–145)

## 2019-09-26 LAB — CBC
HCT: 34.4 % — ABNORMAL LOW (ref 36.0–46.0)
Hemoglobin: 11.1 g/dL — ABNORMAL LOW (ref 12.0–15.0)
MCH: 31.4 pg (ref 26.0–34.0)
MCHC: 32.3 g/dL (ref 30.0–36.0)
MCV: 97.2 fL (ref 80.0–100.0)
Platelets: 220 10*3/uL (ref 150–400)
RBC: 3.54 MIL/uL — ABNORMAL LOW (ref 3.87–5.11)
RDW: 13.9 % (ref 11.5–15.5)
WBC: 5.5 10*3/uL (ref 4.0–10.5)
nRBC: 0 % (ref 0.0–0.2)

## 2019-09-26 IMAGING — CT CT ABD-PELV W/ CM
2 of 5 series · 16 of 46 positions shown, 18 images · IV contrast (APPLIED)
Comparison: CTA of the abdomen and pelvis on 05/01/2018

CLINICAL DATA: Status post right hemicolectomy and ileostomy for
ischemic colitis on 05/04/2018. Leukocytosis and encephalopathy.

EXAM:
CT ABDOMEN AND PELVIS WITH CONTRAST
TECHNIQUE: Multidetector CT imaging of the abdomen and pelvis was performed
using the standard protocol following bolus administration of
intravenous contrast.
CONTRAST:  100mL [id number] IOPAMIDOL ([id number]) INJECTION 61%

[Series 2: routine abd/pel with · axial · 0.82mm/px · z∈[+78,+422]mm · 13 of 79 slices shown, 15 images]
[im 5/79  soft-tissue]
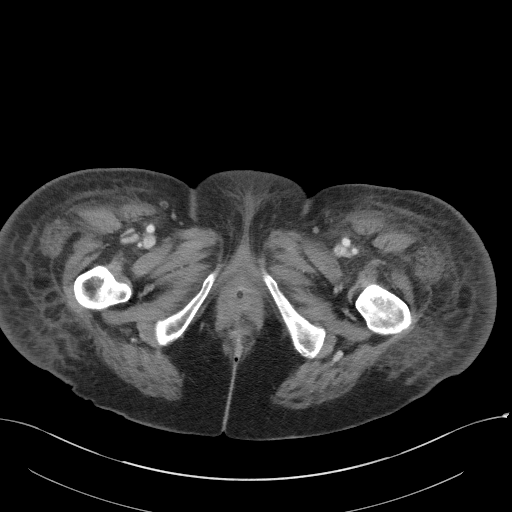
[im 5/79  bone]
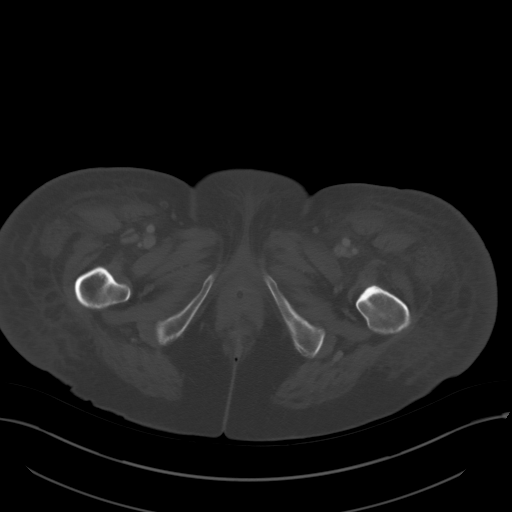
[im 13/79  soft-tissue]
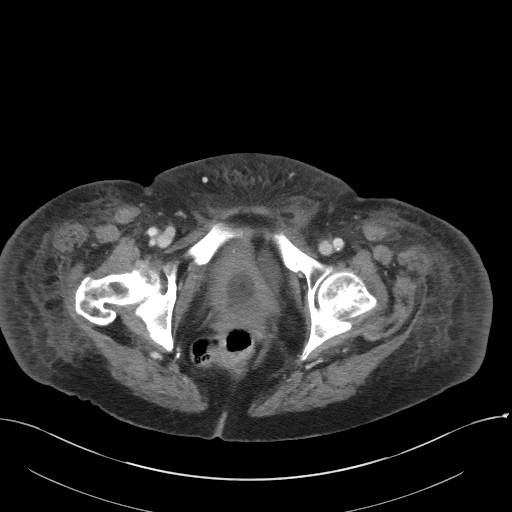
[im 17/79  soft-tissue]
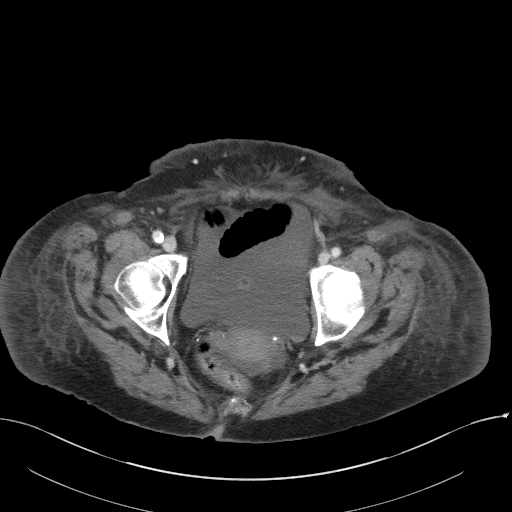
[im 21/79  soft-tissue]
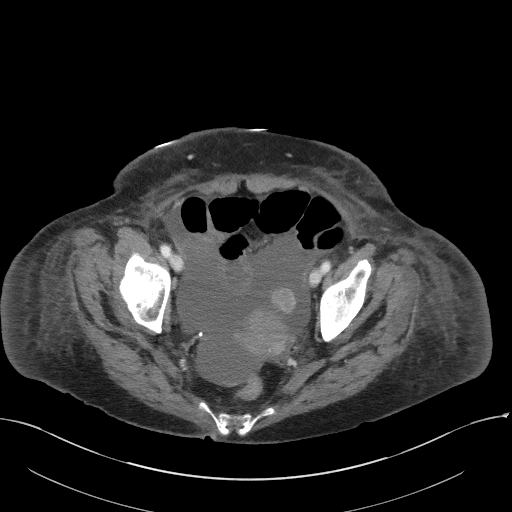
[im 29/79  soft-tissue]
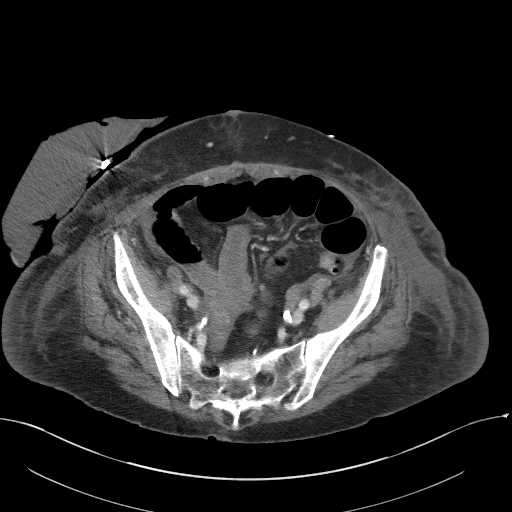
[im 33/79  soft-tissue]
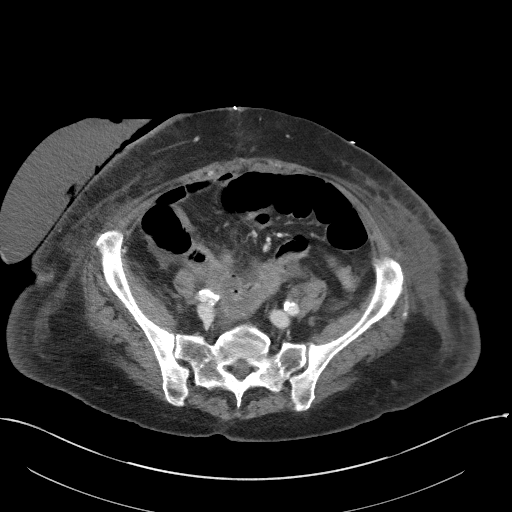
[im 42/79  soft-tissue]
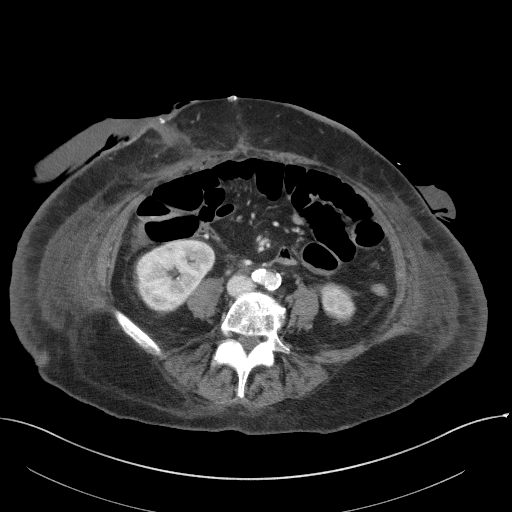
[im 46/79  soft-tissue]
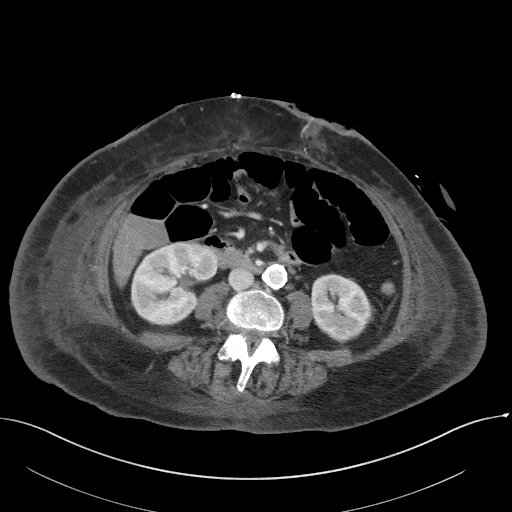
[im 50/79  soft-tissue]
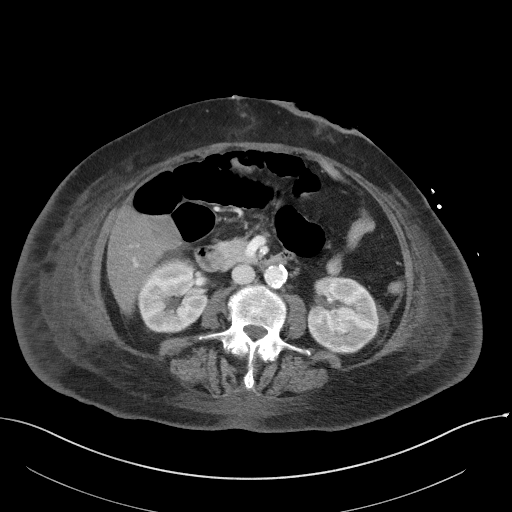
[im 50/79  bone]
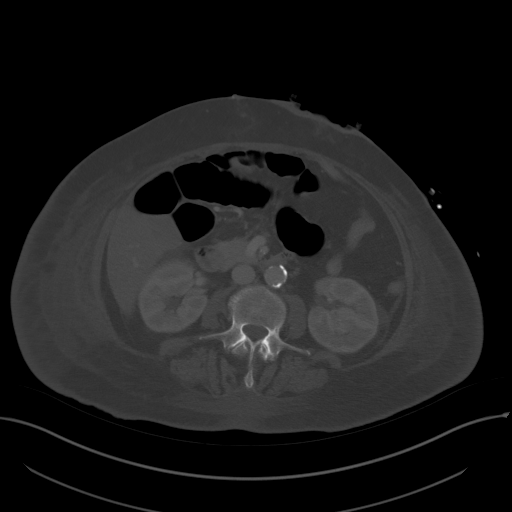
[im 58/79  soft-tissue]
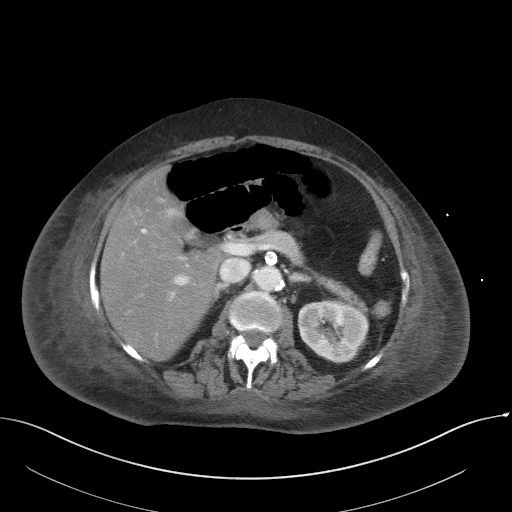
[im 62/79  soft-tissue]
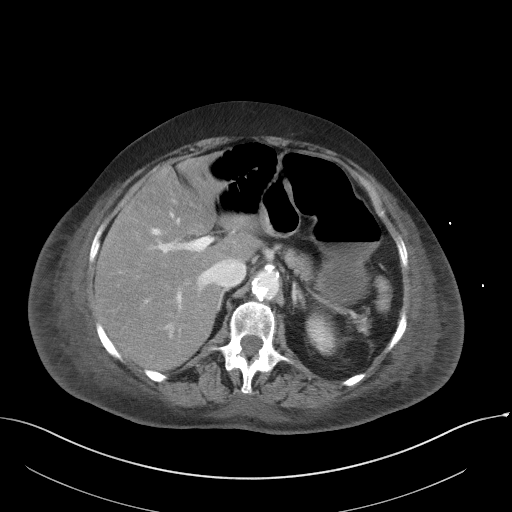
[im 66/79  soft-tissue]
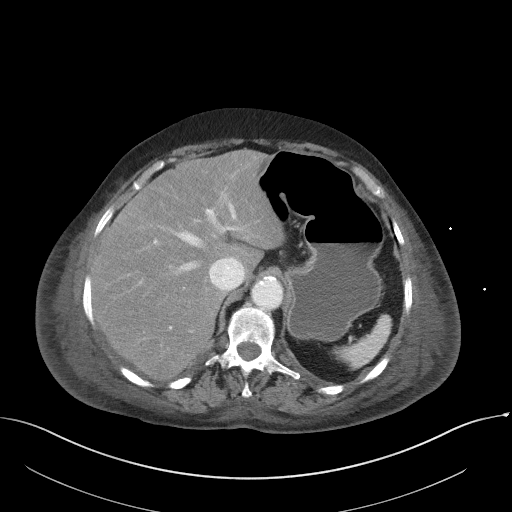
[im 74/79  soft-tissue]
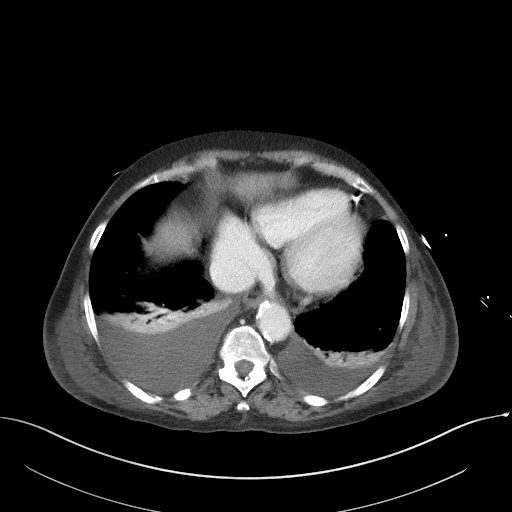

[Series 5: coronal st · coronal · 0.77mm/px · 3 of 97 slices shown]
[im 33/97  soft-tissue]
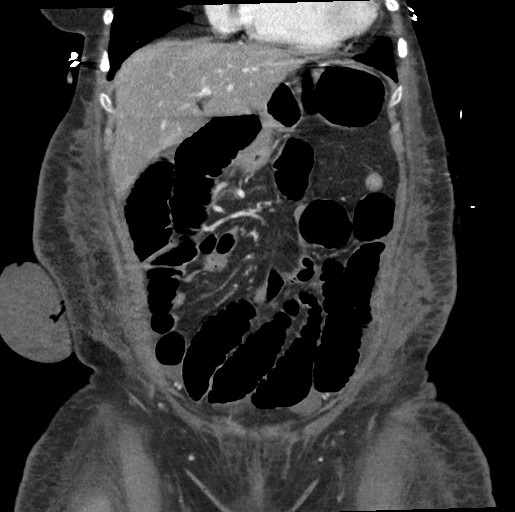
[im 43/97  soft-tissue]
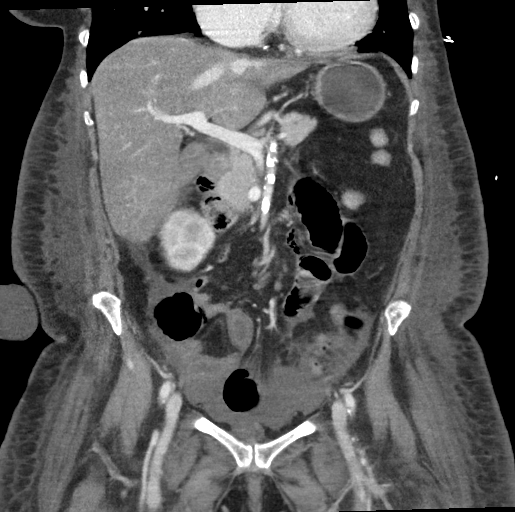
[im 54/97  soft-tissue]
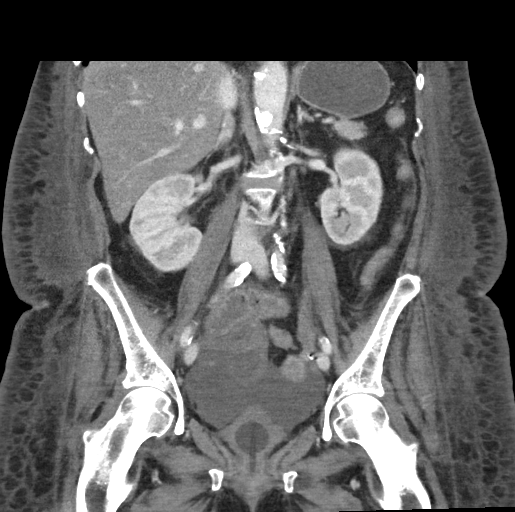

[16 of 46 positions shown; findings below may reference images not displayed]

FINDINGS: Lower chest: Small bilateral pleural effusions, right greater than
left.

Hepatobiliary: Diffuse hepatic steatosis. No biliary ductal
dilatation. Gallbladder is unremarkable.

Pancreas: Unremarkable. No pancreatic ductal dilatation or
surrounding inflammatory changes.

Spleen: Normal in size without focal abnormality.

Adrenals/Urinary Tract: Adrenal glands are unremarkable. Kidneys are
normal, without renal calculi, focal lesion, or hydronephrosis.
Bladder is unremarkable.

Stomach/Bowel: There are some gas-filled and mildly dilated small
bowel loops with maximal caliber of 3.4 cm. Some of the fluid filled
small bowel loops in the pelvis may be mildly thickened. However,
this appearance may be accentuated by surrounding free fluid. No
free intraperitoneal air identified. Ileostomy present without
evidence of complication. Mucous fistula of the descending colon
exits the left lower abdominal wall without evidence of
complication.

Vascular/Lymphatic: Stable calcified aorta and visceral arteries
without evidence of aneurysm. Mesenteric veins, portal vein and
splenic vein are normally patent.

Reproductive: Uterus and bilateral adnexa are unremarkable.

Other: There is mild to moderate free fluid in the dependent pelvis.
No evidence to suggest obvious focal marginated abscess. No evidence
of hemoperitoneum. Diffuse body wall edema is consistent with
anasarca.

Musculoskeletal: No acute or significant osseous findings.
IMPRESSION: 1. Small bilateral pleural effusions, right greater than left.
2. Diffuse hepatic steatosis.
3. Mildly dilated small bowel loops likely reflects ileus without
significant obstruction. Some small bowel loops in the pelvis may be
mildly thickened. However, this appearance may be somewhat related
to the presence of surrounding free fluid. Ileostomy and mucous
fistula show no evidence of complication.
4. Free fluid in the pelvis without marginated abscess.
5. Diffuse body wall edema is consistent with anasarca.

## 2019-09-26 MED ORDER — SODIUM CHLORIDE 0.9% FLUSH
3.0000 mL | Freq: Once | INTRAVENOUS | Status: AC
  Administered 2019-09-27: 3 mL via INTRAVENOUS

## 2019-09-26 NOTE — ED Notes (Signed)
ED Provider at bedside.  Pt c/o of left lower chest pain, no vomiting but reports N/Chills, "everything smells like Clorox", pt reports exposure to granddaughter that's has cough and "she was around it"  Pt has "drain site" on the left and ostomy on the right from bowel resection

## 2019-09-26 NOTE — ED Provider Notes (Signed)
[facility] Emergency Department Provider Note ____   First MD Initiated Contact with Patient 09/26/19 2334     (approximate)  I have reviewed the triage vital signs and the nursing notes.   HISTORY  Chief Complaint Chest Pain, Shortness of Breath, and Headache    HPI Danielle [name] is a 62 y.o. female with below list of previous medical conditions presents to the emergency department secondary to a 2-day history of fatigue, nonproductive cough, generalized myalgia, abnormal sense of smell, nausea.  Patient does admit to one sick contact her granddaughter who was also coughing.  Patient also admits to subjective fevers and chills.        Past Medical History:  Diagnosis Date  . Anxiety   . Arthritis   . Breast cancer ([place]) 2000  . Cancer ([place]) left    breast cancer 2000, chemo tx's with total mastectomy and lymph nodes resected.   . Cancer of right lung ([place]) 02/21/2016   rad tx's.   . CHF (congestive heart failure) ([place])   . COPD (chronic obstructive pulmonary disease) ([place])   . Dependence on supplemental oxygen   . Depression   . Heart murmur   . Hypertension   . Lung nodule   . Lymphedema   . Personal history of chemotherapy   . Personal history of radiation therapy   . Shortness of breath dyspnea    with exertion  . Status post chemotherapy 2001   left breast cancer  . Status post radiation therapy 2001   left breast cancer    Patient Active Problem List   Diagnosis Date Noted  . Acute blood loss anemia 06/20/2019  . Anemia 06/20/2019  . GI bleeding 06/19/2019  . Anemia of chronic kidney failure, stage 4 (severe) ([place]) 06/12/2019  . Malignant neoplasm of lower third of esophagus ([place]) 05/01/2019  . Acute on chronic respiratory failure with hypoxia and hypercapnia ([place]) 12/22/2018  . Ileostomy dysfunction ([place]) 09/28/2018  . Reflux esophagitis   . Iron deficiency anemia secondary to blood loss (chronic)   . SBO (small bowel  obstruction) ([place]) 06/29/2018  . Chronic systolic heart failure ([place]) 06/09/2018  . HTN (hypertension) 06/09/2018  . Atrial fibrillation ([place]) 06/09/2018  . Lymphedema 06/09/2018  . Protein-calorie malnutrition, severe 06/04/2018  . Blood in stool   . Focal (segmental) acute (reversible) ischemia of large intestine ([place])   . Ulceration of intestine   . Diverticulosis of large intestine without diverticulitis   . Abnormal CT scan, colon   . Colitis   . COPD exacerbation ([place])   . Malnutrition of moderate degree 04/23/2018  . Palliative care by specialist   . DNR (do not resuscitate) discussion   . Weakness generalized   . Hyponatremia 02/10/2017  . Syncope 02/09/2017  . Lung nodule, solitary 07/25/2016  . Malignant neoplasm of upper lobe of right lung ([place]) 07/25/2016  . Carcinoma of overlapping sites of left breast in female, estrogen receptor positive ([place]) 06/22/2016  . Multiple lung nodules 06/22/2016  . Lesion of right lung   . Breast cancer in female [facility]) 02/21/2016  . Moderate COPD (chronic obstructive pulmonary disease) ([place]) 08/24/2014    Past Surgical History:  Procedure Laterality Date  . Breast Biospy Left    [facility]  . BREAST SURGERY    . COLONOSCOPY N/A 04/30/2018   Procedure: COLONOSCOPY;  Surgeon: Virgel Manifold, MD;  Location: [facility] ENDOSCOPY;  Service: Endoscopy;  Laterality: N/A;  . COLONOSCOPY N/A 07/22/2018  Procedure: COLONOSCOPY;  Surgeon: Virgel Manifold, MD;  Location: [facility] ENDOSCOPY;  Service: Endoscopy;  Laterality: N/A;  . DILATION AND CURETTAGE OF UTERUS    . ELECTROMAGNETIC NAVIGATION BROCHOSCOPY Right 04/11/2016   Procedure: ELECTROMAGNETIC NAVIGATION BRONCHOSCOPY;  Surgeon: Vilinda Boehringer, MD;  Location: [facility] ORS;  Service: Cardiopulmonary;  Laterality: Right;  . ESOPHAGOGASTRODUODENOSCOPY N/A 07/22/2018   Procedure: ESOPHAGOGASTRODUODENOSCOPY (EGD);  Surgeon: Virgel Manifold, MD;  Location: [facility] ENDOSCOPY;  Service: Endoscopy;   Laterality: N/A;  . ESOPHAGOGASTRODUODENOSCOPY (EGD) WITH PROPOFOL N/A 05/07/2018   Procedure: ESOPHAGOGASTRODUODENOSCOPY (EGD) WITH PROPOFOL;  Surgeon: Lucilla Lame, MD;  Location: [facility] ENDOSCOPY;  Service: Endoscopy;  Laterality: N/A;  . ESOPHAGOGASTRODUODENOSCOPY (EGD) WITH PROPOFOL N/A 04/24/2019   Procedure: ESOPHAGOGASTRODUODENOSCOPY (EGD) WITH PROPOFOL;  Surgeon: Jonathon Bellows, MD;  Location: [facility] ENDOSCOPY;  Service: Gastroenterology;  Laterality: N/A;  . EUS N/A 05/07/2019   Procedure: FULL UPPER ENDOSCOPIC ULTRASOUND (EUS) RADIAL;  Surgeon: Jola Schmidt, MD;  Location: [facility] ENDOSCOPY;  Service: Endoscopy;  Laterality: N/A;  . history of colonoscopy]    . ILEOSCOPY N/A 07/22/2018   Procedure: ILEOSCOPY THROUGH STOMA;  Surgeon: Virgel Manifold, MD;  Location: [facility] ENDOSCOPY;  Service: Endoscopy;  Laterality: N/A;  . ILEOSTOMY    . ILEOSTOMY N/A 09/08/2018   Procedure: ILEOSTOMY REVISION POSSIBLE CREATION;  Surgeon: Herbert Pun, MD;  Location: [facility] ORS;  Service: General;  Laterality: N/A;  . ILEOSTOMY CLOSURE N/A 08/15/2018   Procedure: DILATION OF ILEOSTOMY STRICTURE;  Surgeon: Herbert Pun, MD;  Location: [facility] ORS;  Service: General;  Laterality: N/A;  . LAPAROTOMY Right 05/04/2018   Procedure: EXPLORATORY LAPAROTOMY right colectomy right and left ostomy;  Surgeon: Herbert Pun, MD;  Location: [facility] ORS;  Service: General;  Laterality: Right;  . LUNG BIOPSY    . MASTECTOMY Left    2000, [facility]  . ROTATOR CUFF REPAIR Right    [facility]    Prior to Admission medications   Medication Sig Start Date End Date Taking? Authorizing Provider  acetaminophen (TYLENOL) 325 MG tablet Take 2 tablets (650 mg total) by mouth every 6 (six) hours as needed for mild pain (or Fever >/= 101). 12/24/18   Gouru, Illene Silver, MD  albuterol (PROVENTIL HFA;VENTOLIN HFA) 108 (90 Base) MCG/ACT inhaler Inhale 2 puffs into the lungs every 6 (six) hours as needed for wheezing or shortness of breath.  04/24/18   Demetrios Loll, MD  calcium-vitamin D (OSCAL WITH D) 250-125 MG-UNIT tablet Take 1 tablet by mouth daily.    [provider]  carvedilol (COREG) 3.125 MG tablet Take 3.125 mg by mouth 2 (two) times daily with a meal.    [provider]  citalopram (CELEXA) 40 MG tablet Take 40 mg by mouth daily.    [provider]  ferrous sulfate 325 (65 FE) MG tablet Take 1 tablet (325 mg total) by mouth 2 (two) times daily with a meal. 07/02/18   [name], MD  Fluticasone-Umeclidin-Vilant 100-62.5-25 MCG/INH AEPB Inhale 1 puff into the lungs daily.  01/07/19   [provider]  furosemide (LASIX) 20 MG tablet Take 1 tablet (20 mg total) by mouth daily. And additional 20mg  dose as needed Patient taking differently: Take 20-40 mg by mouth See admin instructions. Take 1 tablet (20mg ) by mouth daily - may take a second tablet (20mg ) by mouth daily as needed for severe swelling 06/15/19   Darylene Price A, FNP  guaiFENesin-dextromethorphan (ROBITUSSIN DM) 100-10 MG/5ML syrup Take 5 mLs by mouth every 4 (four) hours as needed for cough.  [provider]  loperamide (IMODIUM A-D) 2 MG tablet Take 1 tablet (2 mg total) by mouth as needed for diarrhea or loose stools. 10/04/18   Herbert Pun, MD  megestrol (MEGACE ES) 625 MG/5ML suspension Take 5 mLs (625 mg total) by mouth daily. 09/21/19   [name], MD  Multiple Vitamin (MULTIVITAMIN WITH MINERALS) TABS tablet Take 1 tablet by mouth daily. 06/06/18   Fritzi Mandes, MD  pantoprazole (PROTONIX) 40 MG tablet Take 1 tablet (40 mg total) by mouth 2 (two) times daily before a meal. 07/23/18   Sudini, Alveta Heimlich, MD  potassium chloride (K-DUR) 10 MEQ tablet Take 10 mEq by mouth daily.     [provider]  sucralfate (CARAFATE) 1 GM/10ML suspension Take 10 mLs (1 g total) by mouth 4 (four) times daily. 08/18/19   Noreene Filbert, MD    Allergies Patient has no known allergies.  Family History    Problem Relation Age of Onset  . Breast cancer Mother 67  . Cancer Mother        Breast   . Cirrhosis Father   . Breast cancer Paternal Aunt 9  . Cancer Maternal Aunt        Breast     Social History Social History   Tobacco Use  . Smoking status: Former Smoker    Packs/day: 0.50    Years: 20.00    Pack years: 10.00    Types: Cigarettes    Quit date: 07/02/2012    Years since quitting: 7.2  . Smokeless tobacco: Current User    Types: Snuff  . Tobacco comment: quit 2014  Substance Use Topics  . Alcohol use: Not Currently    Comment: Occasionally  . Drug use: No    Review of Systems Constitutional: Positive for fever/chills Eyes: No visual changes. ENT: No sore throat. Cardiovascular: Denies chest pain. Respiratory: Denies shortness of breath.  Positive for cough Gastrointestinal: No abdominal pain.  No nausea, no vomiting.  No diarrhea.  No constipation. Genitourinary: Negative for dysuria. Musculoskeletal: Negative for neck pain.  Negative for back pain. Integumentary: Negative for rash. Neurological: Negative for headaches, focal weakness or   ____________________________________________   PHYSICAL EXAM:  VITAL SIGNS: ED Triage Vitals [09/26/19 2044]  Enc Vitals Group     BP 103/78     Pulse Rate (!) 110     Resp 20     Temp 99.3 F (37.4 C)     Temp Source Oral     SpO2 96 %     Weight 55.3 kg (122 lb)     Height 1.676 m (5\' 6" )     Head Circumference      Peak Flow      Pain Score 5     Pain Loc      Pain Edu?      Excl. in [place]?     Constitutional: Alert and oriented.  Eyes: Conjunctivae are normal.  Mouth/Throat: Patient is wearing a mask. Neck: No stridor.  No meningeal signs.   Cardiovascular: Normal rate, regular rhythm. Good peripheral circulation. Grossly normal heart sounds. Respiratory: Tachypnea and diffuse rhonchi Gastrointestinal: Soft and nontender. No distention.  Musculoskeletal: No lower extremity tenderness nor edema. No  gross deformities of extremities. Neurologic:  Normal speech and language. No gross focal neurologic deficits are appreciated.  Skin:  Skin is warm, dry and intact. Psychiatric: Mood and affect are normal. Speech and behavior are normal.  ____________________________________________   LABS (all labs ordered are listed,  but only abnormal results are displayed)  Labs Reviewed  CBC - Abnormal; Notable for the following components:      Result Value   RBC 3.54 (*)    Hemoglobin 11.1 (*)    HCT 34.4 (*)    All other components within normal limits  BASIC METABOLIC PANEL - Abnormal; Notable for the following components:   Sodium 131 (*)    CO2 12 (*)    Glucose, Bld 121 (*)    BUN 61 (*)    Creatinine, Ser 4.50 (*)    GFR calc non Af Amer 10 (*)    GFR calc Af Amer 11 (*)    All other components within normal limits  TROPONIN I (HIGH SENSITIVITY) - Abnormal; Notable for the following components:   Troponin I (High Sensitivity) 31 (*)    All other components within normal limits  POC SARS CORONAVIRUS 2 AG -  ED  TROPONIN I (HIGH SENSITIVITY)   ____________________________________________  EKG ED ECG REPORT I, [place], the attending physician, personally viewed and interpreted this ECG.   Date: 09/26/2019  EKG Time: 8:34 PM  Rate: 116  Rhythm: Sinus tachycardia  Axis: Normal  Intervals: Normal  ST&T Change: None  ____________________________________________  RADIOLOGY I, [address] Toshi Ishii, personally viewed and evaluated these images (plain radiographs) as part of my medical decision making, as well as reviewing the written report by the radiologist.  ED MD interpretation: Chronic lung changes without evidence of any new cardiopulmonary process  Official radiology report(s): DG Chest Port 1 View  Result Date: 09/26/2019 CLINICAL DATA:  Chest pressure, shortness of breath EXAM: PORTABLE CHEST 1 VIEW COMPARISON:  06/19/2019 FINDINGS: Heart size is normal. Stable  mediastinal contours with chronic radiation fibrosis in the right perihilar region. Prominence of the left hilum is unchanged from priors. Chronic coarsened interstitial markings. No new focal airspace consolidation, pleural effusion, or pneumothorax. IMPRESSION: Chronic lung changes without evidence of new or acute cardiopulmonary process. Electronically Signed   By: Davina Poke M.D.   On: 09/26/2019 23:42       .Critical Care Performed by: Gregor Hams, MD Authorized by: Gregor Hams, MD   Critical care provider statement:    Critical care time (minutes):  30   Critical care time was exclusive of:  Separately billable procedures and treating other patients (Acute respiratory failure with hypoxia)   Critical care was time spent personally by me on the following activities:  Development of treatment plan with patient or surrogate, discussions with consultants, evaluation of patient's response to treatment, examination of patient, obtaining history from patient or surrogate, ordering and performing treatments and interventions, ordering and review of laboratory studies, ordering and review of radiographic studies, pulse oximetry, re-evaluation of patient's condition and review of old charts     ____________________________________________   INITIAL IMPRESSION / MDM / [place] / ED COURSE  As part of my medical decision making, I reviewed the following data within the electronic MEDICAL RECORD NUMBER   63 year old female presented with above-stated history and physical exam concerning for COVID-19 infection which was confirmed.  Given patient's work of breathing hypoxia with minimal exertion.  Patient given Decadron 10 mg IV and azithromycin 500 mg patient will be admitted to the hospital for further evaluation and management.  ____________________________________________  FINAL CLINICAL IMPRESSION(S) / ED DIAGNOSES  Final diagnoses:  COVID-19 virus infection    Acute respiratory failure with hypoxia ([facility])     MEDICATIONS GIVEN DURING THIS  VISIT:  Medications  sodium chloride flush (NS) 0.9 % injection 3 mL (has no administration in time range)     ED Discharge Orders    None      *Please note:  LUE SYKORA was evaluated in Emergency Department on 09/26/2019 for the symptoms described in the history of present illness. She was evaluated in the context of the global COVID-19 pandemic, which necessitated consideration that the patient might be at risk for infection with the SARS-CoV-2 virus that causes COVID-19. Institutional protocols and algorithms that pertain to the evaluation of patients at risk for COVID-19 are in a state of rapid change based on information released by regulatory bodies including the CDC and federal and state organizations. These policies and algorithms were followed during the patient's care in the ED.  Some ED evaluations and interventions may be delayed as a result of limited staffing during the pandemic.*  Note:  This document was prepared using Dragon voice recognition software and may include unintentional dictation errors.   Gregor Hams, MD 09/27/19 [phone]

## 2019-09-26 NOTE — ED Triage Notes (Signed)
States chest pressure, sob and headache since yesterday. Denies fevers. States has had some cough though none at present.

## 2019-09-27 ENCOUNTER — Inpatient Hospital Stay: Payer: Medicare Other

## 2019-09-27 DIAGNOSIS — N179 Acute kidney failure, unspecified: Secondary | ICD-10-CM

## 2019-09-27 DIAGNOSIS — Z7951 Long term (current) use of inhaled steroids: Secondary | ICD-10-CM | POA: Diagnosis not present

## 2019-09-27 DIAGNOSIS — E878 Other disorders of electrolyte and fluid balance, not elsewhere classified: Secondary | ICD-10-CM | POA: Diagnosis present

## 2019-09-27 DIAGNOSIS — U071 COVID-19: Secondary | ICD-10-CM | POA: Diagnosis present

## 2019-09-27 DIAGNOSIS — J9600 Acute respiratory failure, unspecified whether with hypoxia or hypercapnia: Secondary | ICD-10-CM

## 2019-09-27 DIAGNOSIS — J988 Other specified respiratory disorders: Secondary | ICD-10-CM | POA: Diagnosis present

## 2019-09-27 DIAGNOSIS — K2970 Gastritis, unspecified, without bleeding: Secondary | ICD-10-CM | POA: Diagnosis present

## 2019-09-27 DIAGNOSIS — E876 Hypokalemia: Secondary | ICD-10-CM | POA: Diagnosis present

## 2019-09-27 DIAGNOSIS — E872 Acidosis: Secondary | ICD-10-CM | POA: Diagnosis present

## 2019-09-27 DIAGNOSIS — Z9221 Personal history of antineoplastic chemotherapy: Secondary | ICD-10-CM | POA: Diagnosis not present

## 2019-09-27 DIAGNOSIS — N184 Chronic kidney disease, stage 4 (severe): Secondary | ICD-10-CM | POA: Diagnosis present

## 2019-09-27 DIAGNOSIS — F419 Anxiety disorder, unspecified: Secondary | ICD-10-CM | POA: Diagnosis present

## 2019-09-27 DIAGNOSIS — F329 Major depressive disorder, single episode, unspecified: Secondary | ICD-10-CM | POA: Diagnosis present

## 2019-09-27 DIAGNOSIS — D631 Anemia in chronic kidney disease: Secondary | ICD-10-CM | POA: Diagnosis present

## 2019-09-27 DIAGNOSIS — N183 Chronic kidney disease, stage 3 unspecified: Secondary | ICD-10-CM

## 2019-09-27 DIAGNOSIS — Z923 Personal history of irradiation: Secondary | ICD-10-CM | POA: Diagnosis not present

## 2019-09-27 DIAGNOSIS — J1289 Other viral pneumonia: Secondary | ICD-10-CM | POA: Diagnosis present

## 2019-09-27 DIAGNOSIS — I5022 Chronic systolic (congestive) heart failure: Secondary | ICD-10-CM | POA: Diagnosis present

## 2019-09-27 DIAGNOSIS — J9601 Acute respiratory failure with hypoxia: Secondary | ICD-10-CM | POA: Diagnosis present

## 2019-09-27 DIAGNOSIS — Z79899 Other long term (current) drug therapy: Secondary | ICD-10-CM | POA: Diagnosis not present

## 2019-09-27 DIAGNOSIS — C155 Malignant neoplasm of lower third of esophagus: Secondary | ICD-10-CM | POA: Diagnosis not present

## 2019-09-27 DIAGNOSIS — Z853 Personal history of malignant neoplasm of breast: Secondary | ICD-10-CM | POA: Diagnosis not present

## 2019-09-27 DIAGNOSIS — Z85118 Personal history of other malignant neoplasm of bronchus and lung: Secondary | ICD-10-CM | POA: Diagnosis not present

## 2019-09-27 DIAGNOSIS — I130 Hypertensive heart and chronic kidney disease with heart failure and stage 1 through stage 4 chronic kidney disease, or unspecified chronic kidney disease: Secondary | ICD-10-CM | POA: Diagnosis present

## 2019-09-27 DIAGNOSIS — J440 Chronic obstructive pulmonary disease with acute lower respiratory infection: Secondary | ICD-10-CM | POA: Diagnosis present

## 2019-09-27 DIAGNOSIS — Z9012 Acquired absence of left breast and nipple: Secondary | ICD-10-CM | POA: Diagnosis not present

## 2019-09-27 HISTORY — DX: COVID-19: U07.1

## 2019-09-27 HISTORY — DX: Other specified respiratory disorders: J98.8

## 2019-09-27 LAB — RESPIRATORY PANEL BY PCR

## 2019-09-27 LAB — C-REACTIVE PROTEIN: CRP: 1.5 mg/dL — ABNORMAL HIGH (ref <1.0)

## 2019-09-27 LAB — FERRITIN: Ferritin: 877 ng/mL — ABNORMAL HIGH (ref 11–307)

## 2019-09-27 LAB — POC SARS CORONAVIRUS 2 AG: SARS Coronavirus 2 Ag: POSITIVE — AB

## 2019-09-27 LAB — INFLUENZA PANEL BY PCR (TYPE A & B)
Influenza A By PCR: NEGATIVE
Influenza B By PCR: NEGATIVE

## 2019-09-27 LAB — LACTATE DEHYDROGENASE: LDH: 145 U/L (ref 98–192)

## 2019-09-27 LAB — CREATININE, SERUM
Creatinine, Ser: 4.28 mg/dL — ABNORMAL HIGH (ref 0.44–1.00)
GFR calc Af Amer: 12 mL/min — ABNORMAL LOW (ref >60)
GFR calc non Af Amer: 10 mL/min — ABNORMAL LOW (ref >60)

## 2019-09-27 LAB — TROPONIN I (HIGH SENSITIVITY): Troponin I (High Sensitivity): 30 ng/L — ABNORMAL HIGH (ref <18)

## 2019-09-27 LAB — FIBRINOGEN: Fibrinogen: 584 mg/dL — ABNORMAL HIGH (ref 210–475)

## 2019-09-27 LAB — PROCALCITONIN: Procalcitonin: 0.26 ng/mL

## 2019-09-27 LAB — FIBRIN DERIVATIVES D-DIMER (ARMC ONLY): Fibrin derivatives D-dimer (ARMC): 1254.03 ng/mL (FEU) — ABNORMAL HIGH (ref 0.00–499.00)

## 2019-09-27 LAB — TRIGLYCERIDES: Triglycerides: 184 mg/dL — ABNORMAL HIGH (ref <150)

## 2019-09-27 LAB — LACTIC ACID, PLASMA: Lactic Acid, Venous: 0.7 mmol/L (ref 0.5–1.9)

## 2019-09-27 LAB — ABO/RH: ABO/RH(D): O POS

## 2019-09-27 MED ORDER — UMECLIDINIUM BROMIDE 62.5 MCG/INH IN AEPB
1.0000 | INHALATION_SPRAY | Freq: Every day | RESPIRATORY_TRACT | Status: DC
  Administered 2019-09-28 – 2019-10-02 (×5): 1 via RESPIRATORY_TRACT
  Filled 2019-09-27 (×2): qty 7

## 2019-09-27 MED ORDER — FLUTICASONE-UMECLIDIN-VILANT 100-62.5-25 MCG/INH IN AEPB: 1.0000 | INHALATION_SPRAY | Freq: Every day | RESPIRATORY_TRACT | Status: DC

## 2019-09-27 MED ORDER — AZITHROMYCIN 500 MG PO TABS
500.0000 mg | ORAL_TABLET | Freq: Once | ORAL | Status: AC
  Administered 2019-09-27: 500 mg via ORAL
  Filled 2019-09-27: qty 1

## 2019-09-27 MED ORDER — ONDANSETRON HCL 4 MG/2ML IJ SOLN
4.0000 mg | Freq: Four times a day (QID) | INTRAMUSCULAR | Status: DC | PRN
  Administered 2019-09-27 – 2019-10-01 (×2): 4 mg via INTRAVENOUS
  Filled 2019-09-27 (×2): qty 2

## 2019-09-27 MED ORDER — GUAIFENESIN-DM 100-10 MG/5ML PO SYRP
10.0000 mL | ORAL_SOLUTION | ORAL | Status: DC | PRN
  Filled 2019-09-27: qty 10

## 2019-09-27 MED ORDER — ALBUTEROL SULFATE HFA 108 (90 BASE) MCG/ACT IN AERS
2.0000 | INHALATION_SPRAY | Freq: Four times a day (QID) | RESPIRATORY_TRACT | Status: DC
  Administered 2019-09-27 – 2019-10-02 (×18): 2 via RESPIRATORY_TRACT
  Filled 2019-09-27: qty 6.7

## 2019-09-27 MED ORDER — FUROSEMIDE 40 MG PO TABS: 20.0000 mg | ORAL_TABLET | Freq: Every day | ORAL | Status: DC

## 2019-09-27 MED ORDER — CALCIUM CARBONATE-VITAMIN D 500-200 MG-UNIT PO TABS
0.5000 | ORAL_TABLET | Freq: Every day | ORAL | Status: DC
  Administered 2019-09-27 – 2019-10-02 (×6): 0.5 via ORAL
  Filled 2019-09-27 (×2): qty 0.5
  Filled 2019-09-27: qty 1
  Filled 2019-09-27: qty 0.5
  Filled 2019-09-27 (×2): qty 1
  Filled 2019-09-27: qty 0.5
  Filled 2019-09-27: qty 1

## 2019-09-27 MED ORDER — ONDANSETRON HCL 4 MG PO TABS
4.0000 mg | ORAL_TABLET | Freq: Four times a day (QID) | ORAL | Status: DC | PRN
  Filled 2019-09-27: qty 1

## 2019-09-27 MED ORDER — ADULT MULTIVITAMIN W/MINERALS CH
1.0000 | ORAL_TABLET | Freq: Every day | ORAL | Status: DC
  Administered 2019-09-28: 1 via ORAL
  Filled 2019-09-27 (×2): qty 1

## 2019-09-27 MED ORDER — ACETAMINOPHEN 325 MG PO TABS
650.0000 mg | ORAL_TABLET | Freq: Four times a day (QID) | ORAL | Status: DC | PRN
  Administered 2019-09-28 – 2019-09-30 (×4): 650 mg via ORAL
  Filled 2019-09-27 (×4): qty 2

## 2019-09-27 MED ORDER — SUCRALFATE 1 GM/10ML PO SUSP
1.0000 g | Freq: Three times a day (TID) | ORAL | Status: DC
  Administered 2019-09-27: 1 g via ORAL
  Filled 2019-09-27 (×3): qty 10

## 2019-09-27 MED ORDER — SUCRALFATE 1 GM/10ML PO SUSP
1.0000 g | Freq: Four times a day (QID) | ORAL | Status: DC
  Administered 2019-09-27 – 2019-10-02 (×19): 1 g via ORAL
  Filled 2019-09-27 (×22): qty 10

## 2019-09-27 MED ORDER — HEPARIN SODIUM (PORCINE) 5000 UNIT/ML IJ SOLN
5000.0000 [IU] | Freq: Three times a day (TID) | INTRAMUSCULAR | Status: DC
  Administered 2019-09-27 – 2019-10-01 (×15): 5000 [IU] via SUBCUTANEOUS
  Filled 2019-09-27 (×18): qty 1

## 2019-09-27 MED ORDER — ZINC SULFATE 220 (50 ZN) MG PO CAPS
220.0000 mg | ORAL_CAPSULE | Freq: Every day | ORAL | Status: DC
  Administered 2019-09-27 – 2019-10-02 (×6): 220 mg via ORAL
  Filled 2019-09-27 (×6): qty 1

## 2019-09-27 MED ORDER — DEXAMETHASONE SODIUM PHOSPHATE 10 MG/ML IJ SOLN
6.0000 mg | INTRAMUSCULAR | Status: DC
  Administered 2019-09-28 – 2019-09-29 (×2): 6 mg via INTRAVENOUS
  Filled 2019-09-27 (×2): qty 0.6
  Filled 2019-09-27: qty 1
  Filled 2019-09-27: qty 0.6

## 2019-09-27 MED ORDER — DEXAMETHASONE SODIUM PHOSPHATE 10 MG/ML IJ SOLN
10.0000 mg | Freq: Once | INTRAMUSCULAR | Status: AC
  Administered 2019-09-27: 10 mg via INTRAVENOUS
  Filled 2019-09-27: qty 1

## 2019-09-27 MED ORDER — ADULT MULTIVITAMIN W/MINERALS CH
1.0000 | ORAL_TABLET | Freq: Every day | ORAL | Status: DC
  Administered 2019-09-27 – 2019-10-02 (×6): 1 via ORAL
  Filled 2019-09-27 (×6): qty 1

## 2019-09-27 MED ORDER — CITALOPRAM HYDROBROMIDE 20 MG PO TABS
40.0000 mg | ORAL_TABLET | Freq: Every day | ORAL | Status: DC
  Administered 2019-09-28 – 2019-10-02 (×5): 40 mg via ORAL
  Filled 2019-09-27 (×7): qty 2

## 2019-09-27 MED ORDER — PANTOPRAZOLE SODIUM 40 MG PO TBEC
40.0000 mg | DELAYED_RELEASE_TABLET | Freq: Two times a day (BID) | ORAL | Status: DC
  Administered 2019-09-27 – 2019-10-02 (×10): 40 mg via ORAL
  Filled 2019-09-27 (×10): qty 1

## 2019-09-27 MED ORDER — ALBUTEROL SULFATE HFA 108 (90 BASE) MCG/ACT IN AERS
2.0000 | INHALATION_SPRAY | Freq: Four times a day (QID) | RESPIRATORY_TRACT | Status: DC | PRN
  Filled 2019-09-27: qty 6.7

## 2019-09-27 MED ORDER — SODIUM CHLORIDE 0.9 % IV SOLN
100.0000 mg | Freq: Every day | INTRAVENOUS | Status: AC
  Administered 2019-09-28 – 2019-10-01 (×4): 100 mg via INTRAVENOUS
  Filled 2019-09-27 (×4): qty 100

## 2019-09-27 MED ORDER — BENZONATATE 100 MG PO CAPS
100.0000 mg | ORAL_CAPSULE | Freq: Once | ORAL | Status: AC
  Administered 2019-09-27: 100 mg via ORAL
  Filled 2019-09-27: qty 1

## 2019-09-27 MED ORDER — CARVEDILOL 6.25 MG PO TABS
3.1250 mg | ORAL_TABLET | Freq: Two times a day (BID) | ORAL | Status: DC
  Administered 2019-09-30 – 2019-10-02 (×4): 3.125 mg via ORAL
  Filled 2019-09-27 (×9): qty 1

## 2019-09-27 MED ORDER — SENNOSIDES-DOCUSATE SODIUM 8.6-50 MG PO TABS
1.0000 | ORAL_TABLET | Freq: Every evening | ORAL | Status: DC | PRN
  Filled 2019-09-27: qty 1

## 2019-09-27 MED ORDER — SODIUM CHLORIDE 0.9 % IV SOLN
INTRAVENOUS | Status: DC
  Administered 2019-09-27 – 2019-09-28 (×3): via INTRAVENOUS

## 2019-09-27 MED ORDER — LOPERAMIDE HCL 2 MG PO CAPS
2.0000 mg | ORAL_CAPSULE | ORAL | Status: DC | PRN
  Administered 2019-09-27 – 2019-09-29 (×3): 2 mg via ORAL
  Filled 2019-09-27 (×3): qty 1

## 2019-09-27 MED ORDER — FERROUS SULFATE 325 (65 FE) MG PO TABS
325.0000 mg | ORAL_TABLET | Freq: Two times a day (BID) | ORAL | Status: DC
  Administered 2019-09-27 – 2019-10-02 (×10): 325 mg via ORAL
  Filled 2019-09-27 (×12): qty 1

## 2019-09-27 MED ORDER — SODIUM CHLORIDE 0.9 % IV SOLN
200.0000 mg | Freq: Once | INTRAVENOUS | Status: AC
  Administered 2019-09-27: 06:00:00 200 mg via INTRAVENOUS
  Filled 2019-09-27: qty 40

## 2019-09-27 MED ORDER — ASCORBIC ACID 500 MG PO TABS
500.0000 mg | ORAL_TABLET | Freq: Every day | ORAL | Status: DC
  Administered 2019-09-27 – 2019-10-01 (×5): 500 mg via ORAL
  Filled 2019-09-27 (×5): qty 1

## 2019-09-27 MED ORDER — FLUTICASONE FUROATE-VILANTEROL 100-25 MCG/INH IN AEPB
1.0000 | INHALATION_SPRAY | Freq: Every day | RESPIRATORY_TRACT | Status: DC
  Administered 2019-09-28 – 2019-10-02 (×5): 1 via RESPIRATORY_TRACT
  Filled 2019-09-27 (×2): qty 28

## 2019-09-27 MED ORDER — MEGESTROL ACETATE 400 MG/10ML PO SUSP
800.0000 mg | Freq: Every day | ORAL | Status: DC
  Administered 2019-09-27 – 2019-10-02 (×6): 800 mg via ORAL
  Filled 2019-09-27 (×7): qty 20

## 2019-09-27 NOTE — H&P (Signed)
History and Physical    Danielle [name]:[id number] DOB: 07-13-1956 DOA: 09/26/2019  PCP: [facility] Chief Complaint: shortness, cough and dizziness  HPI: Danielle [name] is a 63 y.o. 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  Also has dizziness on standing and reports feeling very weak.  Emergency room blood pressure was 103/78 with a temperature of 99.3 heart rate 110 with O2 sat of 90% on room air.  Desaturated with minimal exertion.  At work was for the most part unremarkable.  Troponin was slightly elevated at 31 but trended down to 30 by the second set.  Covid test was positive.  Chest x-ray showed chronic lung changes.  The ER patient was started on oxygen at 3 L improving to 96%.   Review of Systems: As per HPI otherwise 10 point review of systems negative.    Past Medical History:  Diagnosis Date  . Anxiety   . Arthritis   . Breast cancer ([place]) 2000  . Cancer ([place]) left    breast cancer 2000, chemo tx's with total mastectomy and lymph nodes resected.   . Cancer of right lung ([place]) 02/21/2016   rad tx's.   . CHF (congestive heart failure) ([place])   . COPD (chronic obstructive pulmonary disease) ([place])   . Dependence on supplemental oxygen   . Depression   . Heart murmur   . Hypertension   . Lung nodule   . Lymphedema   . Personal history of chemotherapy   . Personal history of radiation therapy   . Shortness of breath dyspnea    with exertion  . Status post chemotherapy 2001   left breast cancer  . Status post radiation therapy 2001   left breast cancer    Past Surgical History:  Procedure Laterality Date  . Breast Biospy Left    [facility]  . BREAST SURGERY    . COLONOSCOPY N/A 04/30/2018   Procedure:  COLONOSCOPY;  Surgeon: Virgel Manifold, MD;  Location: [facility] ENDOSCOPY;  Service: Endoscopy;  Laterality: N/A;  . COLONOSCOPY N/A 07/22/2018   Procedure: COLONOSCOPY;  Surgeon: Virgel Manifold, MD;  Location: [facility] ENDOSCOPY;  Service: Endoscopy;  Laterality: N/A;  . DILATION AND CURETTAGE OF UTERUS    . ELECTROMAGNETIC NAVIGATION BROCHOSCOPY Right 04/11/2016   Procedure: ELECTROMAGNETIC NAVIGATION BRONCHOSCOPY;  Surgeon: Vilinda Boehringer, MD;  Location: [facility] ORS;  Service: Cardiopulmonary;  Laterality: Right;  . ESOPHAGOGASTRODUODENOSCOPY N/A 07/22/2018   Procedure: ESOPHAGOGASTRODUODENOSCOPY (EGD);  Surgeon: Virgel Manifold, MD;  Location: [facility] ENDOSCOPY;  Service: Endoscopy;  Laterality: N/A;  . ESOPHAGOGASTRODUODENOSCOPY (EGD) WITH PROPOFOL N/A 05/07/2018   Procedure: ESOPHAGOGASTRODUODENOSCOPY (EGD) WITH PROPOFOL;  Surgeon: Lucilla Lame, MD;  Location: [facility] ENDOSCOPY;  Service: Endoscopy;  Laterality: N/A;  . ESOPHAGOGASTRODUODENOSCOPY (EGD) WITH PROPOFOL N/A 04/24/2019   Procedure: ESOPHAGOGASTRODUODENOSCOPY (EGD) WITH PROPOFOL;  Surgeon: Jonathon Bellows, MD;  Location: [facility] ENDOSCOPY;  Service: Gastroenterology;  Laterality: N/A;  . EUS N/A 05/07/2019   Procedure: FULL UPPER ENDOSCOPIC ULTRASOUND (EUS) RADIAL;  Surgeon: Jola Schmidt, MD;  Location: [facility] ENDOSCOPY;  Service: Endoscopy;  Laterality: N/A;  . history of colonoscopy]    . ILEOSCOPY N/A 07/22/2018   Procedure: ILEOSCOPY THROUGH STOMA;  Surgeon: Virgel Manifold, MD;  Location: [facility] ENDOSCOPY;  Service: Endoscopy;  Laterality: N/A;  .  ILEOSTOMY    . ILEOSTOMY N/A 09/08/2018   Procedure: ILEOSTOMY REVISION POSSIBLE CREATION;  Surgeon: Herbert Pun, MD;  Location: [facility] ORS;  Service: General;  Laterality: N/A;  . ILEOSTOMY CLOSURE N/A 08/15/2018   Procedure: DILATION OF ILEOSTOMY STRICTURE;  Surgeon: Herbert Pun, MD;  Location: [facility] ORS;  Service: General;  Laterality: N/A;  . LAPAROTOMY Right 05/04/2018    Procedure: EXPLORATORY LAPAROTOMY right colectomy right and left ostomy;  Surgeon: Herbert Pun, MD;  Location: [facility] ORS;  Service: General;  Laterality: Right;  . LUNG BIOPSY    . MASTECTOMY Left    2000, [facility]  . ROTATOR CUFF REPAIR Right    [facility]     reports that she quit smoking about 7 years ago. Her smoking use included cigarettes. She has a 10.00 pack-year smoking history. Her smokeless tobacco use includes snuff. She reports previous alcohol use. She reports that she does not use drugs.  No Known Allergies  Family History  Problem Relation Age of Onset  . Breast cancer Mother 53  . Cancer Mother        Breast   . Cirrhosis Father   . Breast cancer Paternal Aunt 4  . Cancer Maternal Aunt        Breast     Prior to Admission medications   Medication Sig Start Date End Date Taking? Authorizing Provider  acetaminophen (TYLENOL) 325 MG tablet Take 2 tablets (650 mg total) by mouth every 6 (six) hours as needed for mild pain (or Fever >/= 101). 12/24/18  Yes Gouru, Illene Silver, MD  albuterol (PROVENTIL HFA;VENTOLIN HFA) 108 (90 Base) MCG/ACT inhaler Inhale 2 puffs into the lungs every 6 (six) hours as needed for wheezing or shortness of breath. 04/24/18  Yes Demetrios Loll, MD  calcium-vitamin D (OSCAL WITH D) 250-125 MG-UNIT tablet Take 1 tablet by mouth daily.   Yes [provider]  carvedilol (COREG) 3.125 MG tablet Take 3.125 mg by mouth 2 (two) times daily with a meal.   Yes [provider]  citalopram (CELEXA) 20 MG tablet Take 40 mg by mouth daily.    Yes [provider]  ferrous sulfate 325 (65 FE) MG tablet Take 1 tablet (325 mg total) by mouth 2 (two) times daily with a meal. 07/02/18  Yes [name], MD  Fluticasone-Umeclidin-Vilant 100-62.5-25 MCG/INH AEPB Inhale 1 puff into the lungs daily.  01/07/19  Yes [provider]  furosemide (LASIX) 20 MG tablet Take 1 tablet (20 mg total) by mouth daily. And additional 20mg  dose as  needed Patient taking differently: Take 20-40 mg by mouth See admin instructions. Take 1 tablet (20mg ) by mouth daily - may take a second tablet (20mg ) by mouth daily as needed for severe swelling 06/15/19  Yes Hackney, Tina A, FNP  guaiFENesin-dextromethorphan (ROBITUSSIN DM) 100-10 MG/5ML syrup Take 5 mLs by mouth every 4 (four) hours as needed for cough.   Yes [provider]  loperamide (IMODIUM A-D) 2 MG tablet Take 1 tablet (2 mg total) by mouth as needed for diarrhea or loose stools. 10/04/18  Yes Herbert Pun, MD  megestrol (MEGACE ES) 625 MG/5ML suspension Take 5 mLs (625 mg total) by mouth daily. 09/21/19  Yes [name], MD  Multiple Vitamin (MULTIVITAMIN WITH MINERALS) TABS tablet Take 1 tablet by mouth daily. 06/06/18  Yes Fritzi Mandes, MD  pantoprazole (PROTONIX) 40 MG tablet Take 1 tablet (40 mg total) by mouth 2 (two) times daily before a meal. 07/23/18  Yes Hillary Bow, MD  potassium chloride (K-DUR) 10 MEQ tablet Take 10 mEq by mouth daily.    Yes [provider]  sucralfate (CARAFATE) 1 GM/10ML suspension Take 10 mLs (1 g total) by mouth 4 (four) times daily. 08/18/19  Yes Noreene Filbert, MD    Physical Exam: Vitals:   09/27/19 1430 09/27/19 1653 09/27/19 1850 09/27/19 1926  BP: 103/70 109/76 97/80 102/72  Pulse: 79 80  79  Resp: (!) 25   18  Temp:      TempSrc:      SpO2: 100%   100%  Weight:      Height:        Constitutional: NAD, calm, comfortable Vitals:   09/27/19 1430 09/27/19 1653 09/27/19 1850 09/27/19 1926  BP: 103/70 109/76 97/80 102/72  Pulse: 79 80  79  Resp: (!) 25   18  Temp:      TempSrc:      SpO2: 100%   100%  Weight:      Height:      .   Constitutional: appears ill, but awake and oriented x3 Eyes: Conjunctivae are normal.  Mouth/Throat: Patient is wearing a mask. Neck: No stridor. No meningeal signs.  Cardiovascular: Normal rate, regular rhythm. Good peripheral circulation. Grossly normal heart  sounds. Respiratory:Tachypnea and diffuse rhonchi Gastrointestinal: Soft and nontender. No distention.  Musculoskeletal: No lower extremity tenderness nor edema. No gross deformities of extremities. Neurologic: Normal speech and language. No gross focal neurologic deficits are appreciated.  Skin: Skin is warm, dry and intact. Psychiatric: Mood and affect are normal. Speech and behavior are normal.  Labs on Admission: I have personally reviewed following labs and imaging studies  CBC: Recent Labs  Lab 09/25/19 1025 09/26/19 2048  WBC 5.4 5.5  NEUTROABS 3.1  --   HGB 10.2* 11.1*  HCT 33.1* 34.4*  MCV 100.3* 97.2  PLT 185 657   Basic Metabolic Panel: Recent Labs  Lab 09/25/19 1025 09/26/19 2210 09/27/19 0239  NA 134* 131*  --   K 4.3 3.9  --   CL 110 106  --   CO2 15* 12*  --   GLUCOSE 105* 121*  --   BUN 44* 61*  --   CREATININE 3.05* 4.50* 4.28*  CALCIUM 9.4 9.7  --    GFR: Estimated Creatinine Clearance: 11.4 mL/min (A) (by C-G formula based on SCr of 4.28 mg/dL (H)). Liver Function Tests: Recent Labs  Lab 09/25/19 1025  AST 36  ALT 24  ALKPHOS 49  BILITOT 0.6  PROT 8.4*  ALBUMIN 4.2   No results for input(s): LIPASE, AMYLASE in the last 168 hours. No results for input(s): AMMONIA in the last 168 hours. Coagulation Profile: No results for input(s): INR, PROTIME in the last 168 hours. Cardiac Enzymes: No results for input(s): CKTOTAL, CKMB, CKMBINDEX, TROPONINI in the last 168 hours. BNP (last 3 results) No results for input(s): PROBNP in the last 8760 hours. HbA1C: No results for input(s): HGBA1C in the last 72 hours. CBG: No results for input(s): GLUCAP in the last 168 hours. Lipid Profile: Recent Labs    09/27/19 0141  TRIG 184*   Thyroid Function Tests: No results for input(s): TSH, T4TOTAL, FREET4, T3FREE, THYROIDAB in the last 72 hours. Anemia Panel: Recent Labs    09/27/19 0141  FERRITIN 877*   Urine analysis:    Component Value  Date/Time   COLORURINE YELLOW (A) 06/19/2019 0845   APPEARANCEUR HAZY (A) 06/19/2019 0845   APPEARANCEUR Clear 12/06/2013 2245   LABSPEC  1.011 06/19/2019 0845   LABSPEC 1.015 12/06/2013 2245   PHURINE 6.0 06/19/2019 0845   GLUCOSEU NEGATIVE 06/19/2019 0845   GLUCOSEU Negative 12/06/2013 2245   HGBUR NEGATIVE 06/19/2019 0845   BILIRUBINUR NEGATIVE 06/19/2019 0845   BILIRUBINUR Negative 12/06/2013 2245   KETONESUR NEGATIVE 06/19/2019 0845   PROTEINUR NEGATIVE 06/19/2019 0845   NITRITE NEGATIVE 06/19/2019 0845   LEUKOCYTESUR TRACE (A) 06/19/2019 0845   LEUKOCYTESUR Negative 12/06/2013 2245    Radiological Exams on Admission: US RENAL  Result Date: 09/27/2019 CLINICAL DATA:  Acute kidney injury EXAM: RENAL / URINARY TRACT ULTRASOUND COMPLETE COMPARISON:  CT dated April 29, 2019 FINDINGS: Right Kidney: Renal measurements: 9.8 x 4.2 x 4.6 cm = volume: 100 mL. There is increased cortical echogenicity. There is no hydronephrosis. Left Kidney: Renal measurements: 9.5 x 4.5 x 4.9 cm = volume: 110 mL. There is increased cortical echogenicity without evidence for hydronephrosis Bladder: Appears normal for degree of bladder distention. Other: None. IMPRESSION: 1. No acute abnormality. 2. Echogenic kidneys bilaterally which can be seen in patients with medical renal disease. Electronically Signed   By: Constance Holster M.D.   On: 09/27/2019 18:46   DG Chest Port 1 View  Result Date: 09/26/2019 CLINICAL DATA:  Chest pressure, shortness of breath EXAM: PORTABLE CHEST 1 VIEW COMPARISON:  06/19/2019 FINDINGS: Heart size is normal. Stable mediastinal contours with chronic radiation fibrosis in the right perihilar region. Prominence of the left hilum is unchanged from priors. Chronic coarsened interstitial markings. No new focal airspace consolidation, pleural effusion, or pneumothorax. IMPRESSION: Chronic lung changes without evidence of new or acute cardiopulmonary process. Electronically Signed   By:  Davina Poke M.D.   On: 09/26/2019 23:42      Assessment/Plan Principal Problem:   Acute respiratory failure due to COVID-19 [facility]) --- Supplemental oxygen to keep sats over 90% --- IV remdesivir, IV steroids, --- Vitamin C, multivitamin and zinc --- Proning as tolerated    AKI (acute kidney injury) ([place]) superimposed on age for CKD --- Monitor renal function.  Hydrate only if worsening given Covid status --- Avoid nephrotoxins. --- Monitor for worsening and consult nephrology if needed    Moderate COPD (chronic obstructive pulmonary disease) ([place]) --- Continue home bronchodilator therapy    Breast cancer in female [facility]) ----No acute issues    Chronic systolic heart failure ([place]) ----Acutely exacerbated ----Home meds as tolerated    HTN (hypertension) --- Stable.  Continue home meds   Malignant neoplasm of lower third of esophagus ([facility])  --- No acute issues   CKD (chronic kidney disease) stage 4, GFR 15-29 ml/min ([facility])     Athena Masse MD [facility]   If 7PM-7AM, please contact night-coverage [email or web address] Password [email or web address]  09/27/2019, 8:04 PM

## 2019-09-27 NOTE — ED Notes (Signed)
Pharm called, d/t call out no one available to deliver meds due, charge called and will pick up meds

## 2019-09-27 NOTE — Progress Notes (Signed)
Patient states she feels unable to walk at this time but has to void. Purewick placed. Meal tray delivered.

## 2019-09-27 NOTE — Progress Notes (Signed)
Brief history: Admitted with fatigue, nonproductive cough, generalized myalgia, abnormal sense of smell, nausea.  Patient does admit to one sick contact her granddaughter who was also coughing.  Patient also admits to subjective fevers and chills.  Subjective: Remained on 2 L supplemental oxygen.  No fever or worsening shortness of breath.  Objective: Vital signs in last 24 hours: Temp:  [99.3 F (37.4 C)] 99.3 F (37.4 C) (12/12 2044) Pulse Rate:  [67-110] 82 (12/13 1230) Resp:  [18-24] 22 (12/13 1230) BP: (84-125)/(62-84) 104/72 (12/13 1230) SpO2:  [96 %-100 %] 100 % (12/13 1230) Weight:  [55.3 kg] 55.3 kg (12/12 2044)  Intake/Output from previous day: 12/12 0701 - 12/13 0700 In: -  Out: 400  Intake/Output this shift: No intake/output data recorded.  Constitutional: Alert and oriented.  Eyes: Conjunctivae are normal.  Mouth/Throat: Patient is wearing a mask. Neck: No stridor.  No meningeal signs.   Cardiovascular: Normal rate, regular rhythm. Good peripheral circulation. Grossly normal heart sounds. Respiratory: Tachypnea and diffuse rhonchi Gastrointestinal: Soft and nontender. No distention.  Musculoskeletal: No lower extremity tenderness nor edema. No gross deformities of extremities. Neurologic:  Normal speech and language. No gross focal neurologic deficits are appreciated.  Skin:  Skin is warm, dry and intact. Psychiatric: Mood and affect are normal. Speech and behavior are normal.  Results for orders placed or performed during the hospital encounter of 09/26/19 (from the past 24 hour(s))  CBC     Status: Abnormal   Collection Time: 09/26/19  8:48 PM  Result Value Ref Range   WBC 5.5 4.0 - 10.5 K/uL   RBC 3.54 (L) 3.87 - 5.11 MIL/uL   Hemoglobin 11.1 (L) 12.0 - 15.0 g/dL   HCT 34.4 (L) 36.0 - 46.0 %   MCV 97.2 80.0 - 100.0 fL   MCH 31.4 26.0 - 34.0 pg   MCHC 32.3 30.0 - 36.0 g/dL   RDW 13.9 11.5 - 15.5 %   Platelets 220 150 - 400 K/uL   nRBC 0.0 0.0 - 0.2 %   Basic metabolic panel     Status: Abnormal   Collection Time: 09/26/19 10:10 PM  Result Value Ref Range   Sodium 131 (L) 135 - 145 mmol/L   Potassium 3.9 3.5 - 5.1 mmol/L   Chloride 106 98 - 111 mmol/L   CO2 12 (L) 22 - 32 mmol/L   Glucose, Bld 121 (H) 70 - 99 mg/dL   BUN 61 (H) 8 - 23 mg/dL   Creatinine, Ser 4.50 (H) 0.44 - 1.00 mg/dL   Calcium 9.7 8.9 - 10.3 mg/dL   GFR calc non Af Amer 10 (L) >60 mL/min   GFR calc Af Amer 11 (L) >60 mL/min   Anion gap 13 5 - 15  Troponin I (High Sensitivity)     Status: Abnormal   Collection Time: 09/26/19 10:10 PM  Result Value Ref Range   Troponin I (High Sensitivity) 31 (H) <18 ng/L  POC SARS Coronavirus 2 Ag     Status: Abnormal   Collection Time: 09/27/19 12:21 AM  Result Value Ref Range   SARS Coronavirus 2 Ag POSITIVE (A) NEGATIVE  Troponin I (High Sensitivity)     Status: Abnormal   Collection Time: 09/27/19 12:48 AM  Result Value Ref Range   Troponin I (High Sensitivity) 30 (H) <18 ng/L  Lactic acid, plasma     Status: None   Collection Time: 09/27/19  1:41 AM  Result Value Ref Range   Lactic Acid, Venous 0.7 0.5 -  1.9 mmol/L  Blood Culture (routine x 2)     Status: None (Preliminary result)   Collection Time: 09/27/19  1:41 AM   Specimen: BLOOD  Result Value Ref Range   Specimen Description BLOOD RIGHT FOREARM    Special Requests      BOTTLES DRAWN AEROBIC AND ANAEROBIC Blood Culture adequate volume   Culture      NO GROWTH < 12 HOURS Performed at [facility], [address], [place], [place] [address]    Report Status PENDING   Blood Culture (routine x 2)     Status: None (Preliminary result)   Collection Time: 09/27/19  1:41 AM   Specimen: BLOOD  Result Value Ref Range   Specimen Description BLOOD RIGHT ANTECUBITAL    Special Requests      BOTTLES DRAWN AEROBIC AND ANAEROBIC Blood Culture adequate volume   Culture      NO GROWTH < 12 HOURS Performed at [facility], [address]  [place], [place] [address]    Report Status PENDING   Fibrin derivatives D-Dimer     Status: Abnormal   Collection Time: 09/27/19  1:41 AM  Result Value Ref Range   Fibrin derivatives D-dimer ([facility]) 1,254.03 (H) 0.00 - 499.00 ng/mL (FEU)  Procalcitonin     Status: None   Collection Time: 09/27/19  1:41 AM  Result Value Ref Range   Procalcitonin 0.26 ng/mL  Lactate dehydrogenase     Status: None   Collection Time: 09/27/19  1:41 AM  Result Value Ref Range   LDH 145 98 - 192 U/L  Ferritin     Status: Abnormal   Collection Time: 09/27/19  1:41 AM  Result Value Ref Range   Ferritin 877 (H) 11 - 307 ng/mL  Triglycerides     Status: Abnormal   Collection Time: 09/27/19  1:41 AM  Result Value Ref Range   Triglycerides 184 (H) <150 mg/dL  Fibrinogen     Status: Abnormal   Collection Time: 09/27/19  1:41 AM  Result Value Ref Range   Fibrinogen 584 (H) 210 - 475 mg/dL  C-reactive protein     Status: Abnormal   Collection Time: 09/27/19  1:41 AM  Result Value Ref Range   CRP 1.5 (H) <1.0 mg/dL  ABO/Rh     Status: None   Collection Time: 09/27/19  2:39 AM  Result Value Ref Range   ABO/RH(D)      O POS Performed at [facility], [address] [place], [place] [address]   Creatinine, serum     Status: Abnormal   Collection Time: 09/27/19  2:39 AM  Result Value Ref Range   Creatinine, Ser 4.28 (H) 0.44 - 1.00 mg/dL   GFR calc non Af Amer 10 (L) >60 mL/min   GFR calc Af Amer 12 (L) >60 mL/min  Influenza panel by PCR (type A & B)     Status: None   Collection Time: 09/27/19  2:39 AM  Result Value Ref Range   Influenza A By PCR NEGATIVE NEGATIVE   Influenza B By PCR NEGATIVE NEGATIVE    Studies/Results: DG Chest Port 1 View  Result Date: 09/26/2019 CLINICAL DATA:  Chest pressure, shortness of breath EXAM: PORTABLE CHEST 1 VIEW COMPARISON:  06/19/2019 FINDINGS: Heart size is normal. Stable mediastinal contours with chronic radiation fibrosis in the right perihilar region.  Prominence of the left hilum is unchanged from priors. Chronic coarsened interstitial markings. No new focal airspace consolidation, pleural effusion, or pneumothorax.  IMPRESSION: Chronic lung changes without evidence of new or acute cardiopulmonary process. Electronically Signed   By: Davina Poke M.D.   On: 09/26/2019 23:42    Scheduled Meds: . albuterol  2 puff Inhalation Q6H  . dexamethasone (DECADRON) injection  6 mg Intravenous Q24H  . heparin  5,000 Units Subcutaneous Q8H  . multivitamin with minerals  1 tablet Oral Daily  . sucralfate  1 g Oral TID WC & HS  . vitamin C  500 mg Oral Daily  . zinc sulfate  220 mg Oral Daily   Continuous Infusions: . [START ON 09/28/2019] remdesivir 100 mg in NS 100 mL     PRN Meds:acetaminophen, guaiFENesin-dextromethorphan, ondansetron **OR** ondansetron (ZOFRAN) IV, senna-docusate  Assessment/Plan: 1) COVID-19 positive: COVID-19 antigen was tested positive in the ER -On admission patient had signs of symptoms of malaise fever nonproductive cough -Patient was given azithromycin 500 mg p.o. x1 in the ER -Patient was also started on remdesivir on admission and Decadron: Patient is to complete the course -Patient is requiring up to 2 L of supplemental oxygen via nasal cannula nurses -Bronchodilator, mucolytic's -May monitor inflammatory markers -Precautions  AKI on CKD, stage IV: Since baseline creatinine about 2 to 3 months ago was 1.6-1.84 and ago was 2.78 -On this admission creatinine was found to be 3.05, has sent to 4.28 today -Likely from dehydration and other etiologies -Hold home medication diuretic -We will start patient on gentle hydration -Check renal panel urine electrolytes -Avoid potential nephrotoxic agents -Maintain good blood pressure -Consulted renal  Hypertension: Stable -Continue home medication  Gastritis: No acute issue -Malignant neoplasm of lower third of esophagus - will resume patient's PPI and  sucralfate -Follow-up outpatient  Neoplasm: History of stage IIIa breast cancer and malignant neoplasm of lower third of esophagus -Stable -Follow-up outpatient  Status post ileostomy:  -No acute issues -Ostomy care  Other chronic home medications to continue  DVT prophylaxis Heparin  LOS: 0 days   Mignonne Afonso Izetta Dakin

## 2019-09-27 NOTE — ED Notes (Signed)
Pt talking with admitting doctor on her personal cell phone.

## 2019-09-27 NOTE — Progress Notes (Signed)
Remdesivir - Pharmacy Brief Note   O:  ALT:  CXR:  SpO2: 100 % on RA   A/P:  Remdesivir 200 mg IVPB once followed by 100 mg IVPB daily x 4 days.   Hart Robinsons, PharmD Clinical Pharmacist 09/27/2019   09/27/2019 2:46 AM

## 2019-09-27 NOTE — ED Notes (Signed)
Pt c/o burning pain in back and right knee after IV Decadron

## 2019-09-27 NOTE — ED Notes (Signed)
Ambulatory in hallway, O2 sats start at 97% in bed, pt assisted to side of bed and then pt unable to stand or walk, pt dizzy and breathing rapidly, pt family updated

## 2019-09-27 NOTE — ED Notes (Signed)
Daughter called on my phone, given to pt; POC shared with pt and daughter

## 2019-09-27 NOTE — ED Notes (Signed)
Pt was cleansed of incontinent urine, sheets changed by noah, rn.

## 2019-09-28 ENCOUNTER — Encounter: Payer: Self-pay | Admitting: Internal Medicine

## 2019-09-28 DIAGNOSIS — J9600 Acute respiratory failure, unspecified whether with hypoxia or hypercapnia: Secondary | ICD-10-CM

## 2019-09-28 DIAGNOSIS — U071 COVID-19: Principal | ICD-10-CM

## 2019-09-28 LAB — COMPREHENSIVE METABOLIC PANEL
ALT: 21 U/L (ref 0–44)
AST: 32 U/L (ref 15–41)
Albumin: 3.7 g/dL (ref 3.5–5.0)
Alkaline Phosphatase: 43 U/L (ref 38–126)
Anion gap: 11 (ref 5–15)
BUN: 64 mg/dL — ABNORMAL HIGH (ref 8–23)
CO2: 12 mmol/L — ABNORMAL LOW (ref 22–32)
Calcium: 8.9 mg/dL (ref 8.9–10.3)
Chloride: 110 mmol/L (ref 98–111)
Creatinine, Ser: 3.58 mg/dL — ABNORMAL HIGH (ref 0.44–1.00)
GFR calc Af Amer: 15 mL/min — ABNORMAL LOW (ref >60)
GFR calc non Af Amer: 13 mL/min — ABNORMAL LOW (ref >60)
Glucose, Bld: 127 mg/dL — ABNORMAL HIGH (ref 70–99)
Potassium: 3.9 mmol/L (ref 3.5–5.1)
Sodium: 133 mmol/L — ABNORMAL LOW (ref 135–145)
Total Bilirubin: 0.7 mg/dL (ref 0.3–1.2)
Total Protein: 7.9 g/dL (ref 6.5–8.1)

## 2019-09-28 LAB — CBC WITH DIFFERENTIAL/PLATELET
Abs Immature Granulocytes: 0.01 10*3/uL (ref 0.00–0.07)
Basophils Absolute: 0 10*3/uL (ref 0.0–0.1)
Basophils Relative: 0 %
Eosinophils Absolute: 0 10*3/uL (ref 0.0–0.5)
Eosinophils Relative: 0 %
HCT: 29.2 % — ABNORMAL LOW (ref 36.0–46.0)
Hemoglobin: 9.5 g/dL — ABNORMAL LOW (ref 12.0–15.0)
Immature Granulocytes: 0 %
Lymphocytes Relative: 15 %
Lymphs Abs: 0.6 10*3/uL — ABNORMAL LOW (ref 0.7–4.0)
MCH: 31 pg (ref 26.0–34.0)
MCHC: 32.5 g/dL (ref 30.0–36.0)
MCV: 95.4 fL (ref 80.0–100.0)
Monocytes Absolute: 0.4 10*3/uL (ref 0.1–1.0)
Monocytes Relative: 10 %
Neutro Abs: 3 10*3/uL (ref 1.7–7.7)
Neutrophils Relative %: 75 %
Platelets: 196 10*3/uL (ref 150–400)
RBC: 3.06 MIL/uL — ABNORMAL LOW (ref 3.87–5.11)
RDW: 13.3 % (ref 11.5–15.5)
WBC: 4 10*3/uL (ref 4.0–10.5)
nRBC: 0 % (ref 0.0–0.2)

## 2019-09-28 LAB — URINALYSIS, COMPLETE (UACMP) WITH MICROSCOPIC
Bacteria, UA: NONE SEEN
Bilirubin Urine: NEGATIVE
Glucose, UA: NEGATIVE mg/dL
Hgb urine dipstick: NEGATIVE
Ketones, ur: NEGATIVE mg/dL
Leukocytes,Ua: NEGATIVE
Nitrite: NEGATIVE
Protein, ur: 30 mg/dL — AB
Specific Gravity, Urine: 1.018 (ref 1.005–1.030)
pH: 5 (ref 5.0–8.0)

## 2019-09-28 LAB — PHOSPHORUS: Phosphorus: 3.5 mg/dL (ref 2.5–4.6)

## 2019-09-28 LAB — FIBRIN DERIVATIVES D-DIMER (ARMC ONLY): Fibrin derivatives D-dimer (ARMC): 863.02 ng/mL (FEU) — ABNORMAL HIGH (ref 0.00–499.00)

## 2019-09-28 LAB — CREATININE, URINE, RANDOM: Creatinine, Urine: 250 mg/dL

## 2019-09-28 LAB — FERRITIN: Ferritin: 855 ng/mL — ABNORMAL HIGH (ref 11–307)

## 2019-09-28 LAB — SODIUM, URINE, RANDOM: Sodium, Ur: <10 mmol/L

## 2019-09-28 LAB — C-REACTIVE PROTEIN: CRP: 0.9 mg/dL (ref <1.0)

## 2019-09-28 LAB — MAGNESIUM: Magnesium: 2.2 mg/dL (ref 1.7–2.4)

## 2019-09-28 LAB — KAPPA/LAMBDA LIGHT CHAINS
Kappa free light chain: 106.7 mg/L — ABNORMAL HIGH (ref 3.3–19.4)
Kappa, lambda light chain ratio: 0.67 (ref 0.26–1.65)
Lambda free light chains: 160 mg/L — ABNORMAL HIGH (ref 5.7–26.3)

## 2019-09-28 NOTE — Progress Notes (Signed)
Consult received for acute kidney injury in the setting of known chronic kidney disease stage IIIb.  Presents now with COVID-19 infection.  Suspect that acute kidney injury related to acute infection.  No urgent indication for dialysis at the moment as creatinine is trending down.  Renal ultrasound reviewed and revealed increased echogenicity consistent with acute kidney injury and underlying chronic kidney disease.  Further plan as patient progresses.

## 2019-09-28 NOTE — Progress Notes (Signed)
Brief history: Admitted with fatigue, nonproductive cough, generalized myalgia, abnormal sense of smell, nausea.  Patient does admit to one sick contact her granddaughter who was also coughing.  Patient also admits to subjective fevers and chills.  Subjective: Patient in the morning resting on her bed.  Needing 2 L supplemental oxygen via nasal cannula.  States she is feeling better than when she came in first.  Objective: Vital signs in last 24 hours: Temp:  [97.9 F (36.6 C)-98.4 F (36.9 C)] 98 F (36.7 C) (12/14 1546) Pulse Rate:  [70-87] 70 (12/14 1546) Resp:  [16-28] 28 (12/14 1546) BP: (91-109)/(62-80) 98/62 (12/14 1546) SpO2:  [100 %] 100 % (12/14 1546)  Intake/Output from previous day: 12/13 0701 - 12/14 0700 In: -  Out: 600 [Urine:500] Intake/Output this shift: No intake/output data recorded.  Constitutional: Alert and oriented.  Eyes: Conjunctivae are normal.  Mouth/Throat: Patient is wearing a mask. Neck: No stridor.  No meningeal signs.   Cardiovascular: Normal rate, regular rhythm. Good peripheral circulation. Grossly normal heart sounds. Respiratory: Tachypnea and diffuse rhonchi Gastrointestinal: Soft and nontender. No distention.  Musculoskeletal: No lower extremity tenderness nor edema. No gross deformities of extremities. Neurologic:  Normal speech and language. No gross focal neurologic deficits are appreciated.  Skin:  Skin is warm, dry and intact. Psychiatric: Mood and affect are normal. Speech and behavior are normal.  Results for orders placed or performed during the hospital encounter of 09/26/19 (from the past 24 hour(s))  Urinalysis, Complete w Microscopic     Status: Abnormal   Collection Time: 09/28/19  2:25 AM  Result Value Ref Range   Color, Urine YELLOW (A) YELLOW   APPearance HAZY (A) CLEAR   Specific Gravity, Urine 1.018 1.005 - 1.030   pH 5.0 5.0 - 8.0   Glucose, UA NEGATIVE NEGATIVE mg/dL   Hgb urine dipstick NEGATIVE NEGATIVE   Bilirubin Urine NEGATIVE NEGATIVE   Ketones, ur NEGATIVE NEGATIVE mg/dL   Protein, ur 30 (A) NEGATIVE mg/dL   Nitrite NEGATIVE NEGATIVE   Leukocytes,Ua NEGATIVE NEGATIVE   RBC / HPF 0-5 0 - 5 RBC/hpf   WBC, UA 0-5 0 - 5 WBC/hpf   Bacteria, UA NONE SEEN NONE SEEN   Squamous Epithelial / LPF 0-5 0 - 5   Ca Oxalate Crys, UA PRESENT   Sodium, urine, random     Status: None   Collection Time: 09/28/19  2:25 AM  Result Value Ref Range   Sodium, Ur <10 mmol/L  Creatinine, urine, random     Status: None   Collection Time: 09/28/19  2:25 AM  Result Value Ref Range   Creatinine, Urine 250 mg/dL  C-reactive protein     Status: None   Collection Time: 09/28/19  4:22 AM  Result Value Ref Range   CRP 0.9 <1.0 mg/dL  Comprehensive metabolic panel     Status: Abnormal   Collection Time: 09/28/19  4:22 AM  Result Value Ref Range   Sodium 133 (L) 135 - 145 mmol/L   Potassium 3.9 3.5 - 5.1 mmol/L   Chloride 110 98 - 111 mmol/L   CO2 12 (L) 22 - 32 mmol/L   Glucose, Bld 127 (H) 70 - 99 mg/dL   BUN 64 (H) 8 - 23 mg/dL   Creatinine, Ser 3.58 (H) 0.44 - 1.00 mg/dL   Calcium 8.9 8.9 - 10.3 mg/dL   Total Protein 7.9 6.5 - 8.1 g/dL   Albumin 3.7 3.5 - 5.0 g/dL   AST 32 15 - 41  U/L   ALT 21 0 - 44 U/L   Alkaline Phosphatase 43 38 - 126 U/L   Total Bilirubin 0.7 0.3 - 1.2 mg/dL   GFR calc non Af Amer 13 (L) >60 mL/min   GFR calc Af Amer 15 (L) >60 mL/min   Anion gap 11 5 - 15  CBC with Differential/Platelet     Status: Abnormal   Collection Time: 09/28/19  4:22 AM  Result Value Ref Range   WBC 4.0 4.0 - 10.5 K/uL   RBC 3.06 (L) 3.87 - 5.11 MIL/uL   Hemoglobin 9.5 (L) 12.0 - 15.0 g/dL   HCT 29.2 (L) 36.0 - 46.0 %   MCV 95.4 80.0 - 100.0 fL   MCH 31.0 26.0 - 34.0 pg   MCHC 32.5 30.0 - 36.0 g/dL   RDW 13.3 11.5 - 15.5 %   Platelets 196 150 - 400 K/uL   nRBC 0.0 0.0 - 0.2 %   Neutrophils Relative % 75 %   Neutro Abs 3.0 1.7 - 7.7 K/uL   Lymphocytes Relative 15 %   Lymphs Abs 0.6 (L) 0.7 -  4.0 K/uL   Monocytes Relative 10 %   Monocytes Absolute 0.4 0.1 - 1.0 K/uL   Eosinophils Relative 0 %   Eosinophils Absolute 0.0 0.0 - 0.5 K/uL   Basophils Relative 0 %   Basophils Absolute 0.0 0.0 - 0.1 K/uL   Immature Granulocytes 0 %   Abs Immature Granulocytes 0.01 0.00 - 0.07 K/uL  Ferritin     Status: Abnormal   Collection Time: 09/28/19  4:22 AM  Result Value Ref Range   Ferritin 855 (H) 11 - 307 ng/mL  Fibrin derivatives D-Dimer ([facility] only)     Status: Abnormal   Collection Time: 09/28/19  4:22 AM  Result Value Ref Range   Fibrin derivatives D-dimer ([facility]) 863.02 (H) 0.00 - 499.00 ng/mL (FEU)  Magnesium     Status: None   Collection Time: 09/28/19  4:22 AM  Result Value Ref Range   Magnesium 2.2 1.7 - 2.4 mg/dL  Phosphorus     Status: None   Collection Time: 09/28/19  4:22 AM  Result Value Ref Range   Phosphorus 3.5 2.5 - 4.6 mg/dL    Studies/Results: US RENAL  Result Date: 09/27/2019 CLINICAL DATA:  Acute kidney injury EXAM: RENAL / URINARY TRACT ULTRASOUND COMPLETE COMPARISON:  CT dated April 29, 2019 FINDINGS: Right Kidney: Renal measurements: 9.8 x 4.2 x 4.6 cm = volume: 100 mL. There is increased cortical echogenicity. There is no hydronephrosis. Left Kidney: Renal measurements: 9.5 x 4.5 x 4.9 cm = volume: 110 mL. There is increased cortical echogenicity without evidence for hydronephrosis Bladder: Appears normal for degree of bladder distention. Other: None. IMPRESSION: 1. No acute abnormality. 2. Echogenic kidneys bilaterally which can be seen in patients with medical renal disease. Electronically Signed   By: Constance Holster M.D.   On: 09/27/2019 18:46   DG Chest Port 1 View  Result Date: 09/26/2019 CLINICAL DATA:  Chest pressure, shortness of breath EXAM: PORTABLE CHEST 1 VIEW COMPARISON:  06/19/2019 FINDINGS: Heart size is normal. Stable mediastinal contours with chronic radiation fibrosis in the right perihilar region. Prominence of the left hilum is  unchanged from priors. Chronic coarsened interstitial markings. No new focal airspace consolidation, pleural effusion, or pneumothorax. IMPRESSION: Chronic lung changes without evidence of new or acute cardiopulmonary process. Electronically Signed   By: Davina Poke M.D.   On: 09/26/2019 23:42    Scheduled Meds: .  albuterol  2 puff Inhalation Q6H  . calcium-vitamin D  0.5 tablet Oral Daily  . carvedilol  3.125 mg Oral BID WC  . citalopram  40 mg Oral Daily  . dexamethasone (DECADRON) injection  6 mg Intravenous Q24H  . ferrous sulfate  325 mg Oral BID WC  . fluticasone furoate-vilanterol  1 puff Inhalation Daily   And  . umeclidinium bromide  1 puff Inhalation Daily  . heparin  5,000 Units Subcutaneous Q8H  . megestrol  800 mg Oral Daily  . multivitamin with minerals  1 tablet Oral Daily  . pantoprazole  40 mg Oral BID AC  . sucralfate  1 g Oral QID  . vitamin C  500 mg Oral Daily  . zinc sulfate  220 mg Oral Daily   Continuous Infusions: . sodium chloride 100 mL/hr at 09/28/19 1548  . remdesivir 100 mg in NS 100 mL Stopped (09/28/19 0951)   PRN Meds:acetaminophen, albuterol, guaiFENesin-dextromethorphan, loperamide, ondansetron **OR** ondansetron (ZOFRAN) IV, senna-docusate  Assessment/Plan: 1) COVID-19 positive: COVID-19 antigen was tested positive in the ER -On admission patient had signs of symptoms of malaise fever nonproductive cough -Patient was given azithromycin 500 mg p.o. x1 in the ER -Continue remdesivir and Decadron started since admission: Patient is to complete the course -Patient is requiring up to 2 L of supplemental oxygen via nasal cannula; does not use any supplemental oxygen at home.  We will try to wean down while maintaining saturation above 90%. -Bronchodilator, mucolytic's -Inflammatory markers are improving daily -Maintain precautions  AKI on CKD, stage IV: Since baseline creatinine about 2 to 3 months ago was 1.6-1.84 and ago was 2.78 -On this  admission creatinine was found to be 3.05, has sent to 4.28 following admission -Improving slowly -Likely from dehydration and other etiologies; FENa calculation shows prerenal -Hold home medication diuretic -Continue gentle hydration -Avoid potential nephrotoxic agents -Maintain good blood pressure -Renal ultrasound does not show anything other than chronic renal failure picture -[place] nephrology will follow the recommendation  Systolic heart failure: No acute issues -Last echo in July August 2019 shows ejection fraction of 35 to 40% -Continue home medication -Salt restriction  Hypertension: Stable -Continue home medication  Gastritis: No acute issue -Malignant neoplasm of lower third of esophagus - will resume patient's PPI and sucralfate -Follow-up outpatient  Neoplasm: History of stage IIIa breast cancer and malignant neoplasm of lower third of esophagus -Stable -Follow-up outpatient  Status post ileostomy:  -No acute issues -Ostomy care  Other chronic home medications to continue  DVT prophylaxis Heparin  LOS: 1 day   [employer]

## 2019-09-28 NOTE — ED Notes (Signed)
Pt complains of nausea 

## 2019-09-28 NOTE — TOC Initial Note (Signed)
Transition of Care [facility]) - Initial/Assessment Note    Patient Details  Name: Danielle [name] MRN: [id number] Date of Birth: 1956-04-15  [facility]) CM/SW Contact:    Trecia Rogers, LCSW Phone Number: 09/28/2019, 2:04 PM  Clinical Narrative:                   Patient came to the hospital from home. Patient arrived due to shortness of breath, cough, and dizziness. Patient is COVID+. Patient is currently on 3L of Oxygen. Patient's PCP is: [facility]. Patient's pharmacy is [facility] in [place], [place]. Patient does not have any concerns with obtaining her medications. Patient lives at home with her son, who helps her out. Patient does have at-home DMEs which are: oxygen, a walker, and a bedside commade.  Patient denies needing any DMEs or services once she is discharged, at this time. No other needs.   Expected Discharge Plan: Home/Self Care Barriers to Discharge: Continued Medical Work up   Patient Goals and CMS Choice Patient states their goals for this hospitalization and ongoing recovery are:: "feel better" CMS Medicare.gov Compare Post Acute Care list provided to:: Patient Choice offered to / list presented to : Patient  Expected Discharge Plan and Services Expected Discharge Plan: Home/Self Care In-house Referral: Clinical Social Work     Living arrangements for the past 2 months: Single Family Home                                      Prior Living Arrangements/Services Living arrangements for the past 2 months: Single Family Home Lives with:: Adult Children Patient language and need for interpreter reviewed:: Yes Do you feel safe going back to the place where you live?: Yes      Need for Family Participation in Patient Care: Yes (Comment)(pt's son) Care giver support system in place?: Yes (comment)(pt's son) Current home services: DME Criminal Activity/Legal Involvement Pertinent to Current Situation/Hospitalization: No -  Comment as needed  Activities of Daily Living      Permission Sought/Granted                  Emotional Assessment   Attitude/Demeanor/Rapport: Unable to Assess Affect (typically observed): Unable to Assess Orientation: : Oriented to Self, Oriented to Place, Oriented to  Time, Oriented to Situation   Psych Involvement: No (comment)  Admission diagnosis:  Respiratory tract infection due to COVID-19 virus [U07.1, J98.8] Patient Active Problem List   Diagnosis Date Noted  . Acute respiratory failure due to COVID-19 ([place]) 09/27/2019  . Respiratory tract infection due to COVID-19 virus 09/27/2019  . Acute blood loss anemia 06/20/2019  . Anemia 06/20/2019  . GI bleeding 06/19/2019  . CKD (chronic kidney disease) stage 4, GFR 15-29 ml/min (HCC) 06/12/2019  . Malignant neoplasm of lower third of esophagus ([place]) 05/01/2019  . Acute on chronic respiratory failure with hypoxia and hypercapnia ([place]) 12/22/2018  . Ileostomy dysfunction ([place]) 09/28/2018  . Reflux esophagitis   . Iron deficiency anemia secondary to blood loss (chronic)   . SBO (small bowel obstruction) ([place]) 06/29/2018  . Chronic systolic heart failure ([place]) 06/09/2018  . HTN (hypertension) 06/09/2018  . Atrial fibrillation ([place]) 06/09/2018  . Lymphedema 06/09/2018  . AKI (acute kidney injury) ([place]) 06/04/2018  . Protein-calorie malnutrition, severe 06/04/2018  . Blood in stool   . Focal (segmental) acute (reversible) ischemia of large intestine ([place])   .  Ulceration of intestine   . Diverticulosis of large intestine without diverticulitis   . Abnormal CT scan, colon   . Colitis   . COPD exacerbation ([place])   . Malnutrition of moderate degree 04/23/2018  . Palliative care by specialist   . DNR (do not resuscitate) discussion   . Weakness generalized   . Hyponatremia 02/10/2017  . Syncope 02/09/2017  . Lung nodule, solitary 07/25/2016  . Malignant neoplasm of upper lobe of right lung ([place]) 07/25/2016  .  Carcinoma of overlapping sites of left breast in female, estrogen receptor positive ([place]) 06/22/2016  . Multiple lung nodules 06/22/2016  . Lesion of right lung   . Breast cancer in female [facility]) 02/21/2016  . Moderate COPD (chronic obstructive pulmonary disease) ([place]) 08/24/2014   PCP:  [facility], [place]:   [place], [place], [place] - [address] [place] [address] Phone: [phone] Fax: [place], [place] - [place] [address] [place] [address] Phone: [phone] Fax: [phone]  [place], [place] [address] [place] [address] Phone: [phone] Fax: [phone]     Social Determinants of Health (SDOH) Interventions    Readmission Risk Interventions Readmission Risk Prevention Plan 09/28/2019 06/20/2019 06/05/2018  Transportation Screening Complete Complete Complete  PCP or Specialist Appt within 3-5 Days - - Complete  Home Care Screening - - Complete  HRI or [place] - - Complete  Social Work Consult for [place] Planning/Counseling - - Complete  Palliative Care Screening - - Not Complete  Medication Review Press photographer) Complete Complete Complete  HRI or Home Care Consult Patient refused Complete -  SW Recovery Care/Counseling Consult Complete - -  Palliative Care Screening Not Applicable Not Applicable -  [place] Not Applicable - -  Some recent data might be hidden

## 2019-09-28 NOTE — ED Notes (Signed)
Lab here for venipuncture.  

## 2019-09-29 LAB — PHOSPHORUS: Phosphorus: 2.9 mg/dL (ref 2.5–4.6)

## 2019-09-29 LAB — COMPREHENSIVE METABOLIC PANEL
ALT: 33 U/L (ref 0–44)
AST: 46 U/L — ABNORMAL HIGH (ref 15–41)
Albumin: 3.1 g/dL — ABNORMAL LOW (ref 3.5–5.0)
Alkaline Phosphatase: 38 U/L (ref 38–126)
Anion gap: 8 (ref 5–15)
BUN: 49 mg/dL — ABNORMAL HIGH (ref 8–23)
CO2: 11 mmol/L — ABNORMAL LOW (ref 22–32)
Calcium: 8 mg/dL — ABNORMAL LOW (ref 8.9–10.3)
Chloride: 118 mmol/L — ABNORMAL HIGH (ref 98–111)
Creatinine, Ser: 2.82 mg/dL — ABNORMAL HIGH (ref 0.44–1.00)
GFR calc Af Amer: 20 mL/min — ABNORMAL LOW (ref >60)
GFR calc non Af Amer: 17 mL/min — ABNORMAL LOW (ref >60)
Glucose, Bld: 114 mg/dL — ABNORMAL HIGH (ref 70–99)
Potassium: 4.3 mmol/L (ref 3.5–5.1)
Sodium: 137 mmol/L (ref 135–145)
Total Bilirubin: 0.6 mg/dL (ref 0.3–1.2)
Total Protein: 6.4 g/dL — ABNORMAL LOW (ref 6.5–8.1)

## 2019-09-29 LAB — CBC WITH DIFFERENTIAL/PLATELET
Abs Immature Granulocytes: 0.01 10*3/uL (ref 0.00–0.07)
Basophils Absolute: 0 10*3/uL (ref 0.0–0.1)
Basophils Relative: 0 %
Eosinophils Absolute: 0 10*3/uL (ref 0.0–0.5)
Eosinophils Relative: 0 %
HCT: 24 % — ABNORMAL LOW (ref 36.0–46.0)
Hemoglobin: 8 g/dL — ABNORMAL LOW (ref 12.0–15.0)
Immature Granulocytes: 0 %
Lymphocytes Relative: 10 %
Lymphs Abs: 0.4 10*3/uL — ABNORMAL LOW (ref 0.7–4.0)
MCH: 30.7 pg (ref 26.0–34.0)
MCHC: 33.3 g/dL (ref 30.0–36.0)
MCV: 92 fL (ref 80.0–100.0)
Monocytes Absolute: 0.1 10*3/uL (ref 0.1–1.0)
Monocytes Relative: 3 %
Neutro Abs: 3.3 10*3/uL (ref 1.7–7.7)
Neutrophils Relative %: 87 %
Platelets: 175 10*3/uL (ref 150–400)
RBC: 2.61 MIL/uL — ABNORMAL LOW (ref 3.87–5.11)
RDW: 14 % (ref 11.5–15.5)
WBC: 3.9 10*3/uL — ABNORMAL LOW (ref 4.0–10.5)
nRBC: 0 % (ref 0.0–0.2)

## 2019-09-29 LAB — OCCULT BLOOD X 1 CARD TO LAB, STOOL: Fecal Occult Bld: NEGATIVE

## 2019-09-29 LAB — VITAMIN B12: Vitamin B-12: 1303 pg/mL — ABNORMAL HIGH (ref 180–914)

## 2019-09-29 LAB — IRON AND TIBC
Iron: 182 ug/dL — ABNORMAL HIGH (ref 28–170)
Saturation Ratios: 86 % — ABNORMAL HIGH (ref 10.4–31.8)
TIBC: 212 ug/dL — ABNORMAL LOW (ref 250–450)
UIBC: 30 ug/dL

## 2019-09-29 LAB — MAGNESIUM: Magnesium: 2 mg/dL (ref 1.7–2.4)

## 2019-09-29 LAB — FIBRIN DERIVATIVES D-DIMER (ARMC ONLY): Fibrin derivatives D-dimer (ARMC): 657.11 ng/mL (FEU) — ABNORMAL HIGH (ref 0.00–499.00)

## 2019-09-29 LAB — FERRITIN: Ferritin: 691 ng/mL — ABNORMAL HIGH (ref 11–307)

## 2019-09-29 LAB — FOLATE: Folate: 20.2 ng/mL (ref >5.9)

## 2019-09-29 LAB — C-REACTIVE PROTEIN: CRP: 0.6 mg/dL (ref <1.0)

## 2019-09-29 MED ORDER — STERILE WATER FOR INJECTION IV SOLN
INTRAVENOUS | Status: DC
  Filled 2019-09-29 (×6): qty 850

## 2019-09-29 MED ORDER — ALUM & MAG HYDROXIDE-SIMETH 200-200-20 MG/5ML PO SUSP: 30.0000 mL | Freq: Four times a day (QID) | ORAL | Status: DC | PRN

## 2019-09-29 NOTE — Progress Notes (Signed)
Brief history: Admitted with fatigue, nonproductive cough, generalized myalgia, abnormal sense of smell, nausea.  Patient does admit to one sick contact her granddaughter who was also coughing.  Patient also admits to subjective fevers and chills.  Subjective: Patient was resting on bed on 2 L of supplemental oxygen via nasal cannula.  Denies any fever or increased shortness of breath.  Did have some abdominal discomfort.  States she is able to take care of her ostomy bag.  Objective: Vital signs in last 24 hours: Temp:  [98 F (36.7 C)-98.4 F (36.9 C)] 98 F (36.7 C) (12/15 0432) Pulse Rate:  [70-81] 73 (12/15 0432) Resp:  [16-28] 16 (12/15 0432) BP: (90-102)/(56-70) 90/56 (12/15 0432) SpO2:  [99 %-100 %] 99 % (12/15 0432)  Intake/Output from previous day: 12/14 0701 - 12/15 0700 In: 1956.6 [I.V.:1854.6] Out: 650 [Urine:350] Intake/Output this shift: No intake/output data recorded.  Constitutional: Alert and oriented.  Eyes: Conjunctivae are normal.  Mouth/Throat: Patient is wearing a mask. Neck: No stridor.  No meningeal signs.   Cardiovascular: Normal rate, regular rhythm. Good peripheral circulation. Grossly normal heart sounds. Respiratory: Tachypnea and diffuse rhonchi Gastrointestinal: Soft and nontender. No distention.  Musculoskeletal: No lower extremity tenderness nor edema. No gross deformities of extremities. Neurologic:  Normal speech and language. No gross focal neurologic deficits are appreciated.  Skin:  Skin is warm, dry and intact. Psychiatric: Mood and affect are normal. Speech and behavior are normal.  Results for orders placed or performed during the hospital encounter of 09/26/19 (from the past 24 hour(s))  C-reactive protein     Status: None   Collection Time: 09/29/19  5:30 AM  Result Value Ref Range   CRP 0.6 <1.0 mg/dL  Comprehensive metabolic panel     Status: Abnormal   Collection Time: 09/29/19  5:30 AM  Result Value Ref Range   Sodium 137 135  - 145 mmol/L   Potassium 4.3 3.5 - 5.1 mmol/L   Chloride 118 (H) 98 - 111 mmol/L   CO2 11 (L) 22 - 32 mmol/L   Glucose, Bld 114 (H) 70 - 99 mg/dL   BUN 49 (H) 8 - 23 mg/dL   Creatinine, Ser 2.82 (H) 0.44 - 1.00 mg/dL   Calcium 8.0 (L) 8.9 - 10.3 mg/dL   Total Protein 6.4 (L) 6.5 - 8.1 g/dL   Albumin 3.1 (L) 3.5 - 5.0 g/dL   AST 46 (H) 15 - 41 U/L   ALT 33 0 - 44 U/L   Alkaline Phosphatase 38 38 - 126 U/L   Total Bilirubin 0.6 0.3 - 1.2 mg/dL   GFR calc non Af Amer 17 (L) >60 mL/min   GFR calc Af Amer 20 (L) >60 mL/min   Anion gap 8 5 - 15  CBC with Differential/Platelet     Status: Abnormal   Collection Time: 09/29/19  5:30 AM  Result Value Ref Range   WBC 3.9 (L) 4.0 - 10.5 K/uL   RBC 2.61 (L) 3.87 - 5.11 MIL/uL   Hemoglobin 8.0 (L) 12.0 - 15.0 g/dL   HCT 24.0 (L) 36.0 - 46.0 %   MCV 92.0 80.0 - 100.0 fL   MCH 30.7 26.0 - 34.0 pg   MCHC 33.3 30.0 - 36.0 g/dL   RDW 14.0 11.5 - 15.5 %   Platelets 175 150 - 400 K/uL   nRBC 0.0 0.0 - 0.2 %   Neutrophils Relative % 87 %   Neutro Abs 3.3 1.7 - 7.7 K/uL   Lymphocytes Relative  10 %   Lymphs Abs 0.4 (L) 0.7 - 4.0 K/uL   Monocytes Relative 3 %   Monocytes Absolute 0.1 0.1 - 1.0 K/uL   Eosinophils Relative 0 %   Eosinophils Absolute 0.0 0.0 - 0.5 K/uL   Basophils Relative 0 %   Basophils Absolute 0.0 0.0 - 0.1 K/uL   Immature Granulocytes 0 %   Abs Immature Granulocytes 0.01 0.00 - 0.07 K/uL  Ferritin     Status: Abnormal   Collection Time: 09/29/19  5:30 AM  Result Value Ref Range   Ferritin 691 (H) 11 - 307 ng/mL  Fibrin derivatives D-Dimer ([facility] only)     Status: Abnormal   Collection Time: 09/29/19  5:30 AM  Result Value Ref Range   Fibrin derivatives D-dimer ([facility]) 657.11 (H) 0.00 - 499.00 ng/mL (FEU)  Magnesium     Status: None   Collection Time: 09/29/19  5:30 AM  Result Value Ref Range   Magnesium 2.0 1.7 - 2.4 mg/dL  Phosphorus     Status: None   Collection Time: 09/29/19  5:30 AM  Result Value Ref Range    Phosphorus 2.9 2.5 - 4.6 mg/dL  Vitamin B12     Status: Abnormal   Collection Time: 09/29/19  5:30 AM  Result Value Ref Range   Vitamin B-12 1,303 (H) 180 - 914 pg/mL  Folate     Status: None   Collection Time: 09/29/19  5:30 AM  Result Value Ref Range   Folate 20.2 >5.9 ng/mL  Iron and TIBC     Status: Abnormal   Collection Time: 09/29/19  5:30 AM  Result Value Ref Range   Iron 182 (H) 28 - 170 ug/dL   TIBC 212 (L) 250 - 450 ug/dL   Saturation Ratios 86 (H) 10.4 - 31.8 %   UIBC 30 ug/dL  Occult blood card to lab, stool     Status: None   Collection Time: 09/29/19 11:22 AM  Result Value Ref Range   Fecal Occult Bld NEGATIVE NEGATIVE    Studies/Results: US RENAL  Result Date: 09/27/2019 CLINICAL DATA:  Acute kidney injury EXAM: RENAL / URINARY TRACT ULTRASOUND COMPLETE COMPARISON:  CT dated April 29, 2019 FINDINGS: Right Kidney: Renal measurements: 9.8 x 4.2 x 4.6 cm = volume: 100 mL. There is increased cortical echogenicity. There is no hydronephrosis. Left Kidney: Renal measurements: 9.5 x 4.5 x 4.9 cm = volume: 110 mL. There is increased cortical echogenicity without evidence for hydronephrosis Bladder: Appears normal for degree of bladder distention. Other: None. IMPRESSION: 1. No acute abnormality. 2. Echogenic kidneys bilaterally which can be seen in patients with medical renal disease. Electronically Signed   By: Constance Holster M.D.   On: 09/27/2019 18:46   DG Chest Port 1 View  Result Date: 09/26/2019 CLINICAL DATA:  Chest pressure, shortness of breath EXAM: PORTABLE CHEST 1 VIEW COMPARISON:  06/19/2019 FINDINGS: Heart size is normal. Stable mediastinal contours with chronic radiation fibrosis in the right perihilar region. Prominence of the left hilum is unchanged from priors. Chronic coarsened interstitial markings. No new focal airspace consolidation, pleural effusion, or pneumothorax. IMPRESSION: Chronic lung changes without evidence of new or acute cardiopulmonary  process. Electronically Signed   By: Davina Poke M.D.   On: 09/26/2019 23:42    Scheduled Meds: . albuterol  2 puff Inhalation Q6H  . calcium-vitamin D  0.5 tablet Oral Daily  . carvedilol  3.125 mg Oral BID WC  . citalopram  40 mg Oral Daily  .  dexamethasone (DECADRON) injection  6 mg Intravenous Q24H  . ferrous sulfate  325 mg Oral BID WC  . fluticasone furoate-vilanterol  1 puff Inhalation Daily   And  . umeclidinium bromide  1 puff Inhalation Daily  . heparin  5,000 Units Subcutaneous Q8H  . megestrol  800 mg Oral Daily  . multivitamin with minerals  1 tablet Oral Daily  . pantoprazole  40 mg Oral BID AC  . sucralfate  1 g Oral QID  . vitamin C  500 mg Oral Daily  . zinc sulfate  220 mg Oral Daily   Continuous Infusions: . remdesivir 100 mg in NS 100 mL 100 mg (09/29/19 1107)  .  sodium bicarbonate (isotonic) infusion in sterile water 75 mL/hr at 09/29/19 1251   PRN Meds:acetaminophen, albuterol, guaiFENesin-dextromethorphan, loperamide, ondansetron **OR** ondansetron (ZOFRAN) IV, senna-docusate  Assessment/Plan: 1) COVID-19 positive: COVID-19 antigen was tested positive in the ER -On admission patient had signs of symptoms of malaise fever nonproductive cough -Patient was given azithromycin 500 mg p.o. x1 in the ER -Continue remdesivir and Decadron started since admission: Patient is to complete the course -Patient is requiring up to 2 L of supplemental oxygen via nasal cannula; does not use any supplemental oxygen at home.  We will try to wean down while maintaining saturation above 90%. -Bronchodilator, mucolytic's -Inflammatory markers are improving daily -Maintain precautions  AKI on CKD, stage IV: Since baseline creatinine about 2 to 3 months ago was 1.6-1.84 and ago was 2.78 -On this admission creatinine was found to be 3.05, has sent to 4.28 following admission -Improving slowly -Likely from dehydration and other etiologies; FENa calculation shows  prerenal -Hold home medication diuretic -Was on normal saline no change to bicarb per nephrology -Avoid potential nephrotoxic agents -Maintain good blood pressure -Renal ultrasound does not show anything other than chronic renal failure picture -Not a candidate for dialysis at this point -Appreciate nephrology recommendation  Anemia: Anemia of chronic disease versus iron deficiency versus megaloblastic anemia stage III breast cancer status post chemotherapy -FOBT came back negative -Her hemoglobin drop to 8 from above 10 on admission -Patient has other comorbidities such as breast cancer, ileostomy -Iron panel shows no IDA -May consider EPO shot per nephrology  Systolic heart failure: No acute issues -Last echo in July August 2019 shows ejection fraction of 35 to 40% -Continue home medication -Salt restriction  Hypertension: Stable -Continue home medication  Gastritis: No acute issue -Malignant neoplasm of lower third of esophagus - will resume patient's PPI and sucralfate -Follow-up outpatient  Neoplasm: History of stage IIIa breast cancer and malignant neoplasm of lower third of esophagus  Other chronic home medications to continue  DVT prophylaxis: Heparin but may change to SCDs due to anemia   LOS: 2 days   [place] -Stable -Follow-up outpatient  Status post ileostomy:  -No acute issues -Ostomy care

## 2019-09-29 NOTE — Evaluation (Signed)
Physical Therapy Evaluation Patient Details Name: Danielle [name] MRN: [id number] DOB: 27-Mar-1956 Today's Date: 09/29/2019   History of Present Illness  63 y.o. 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  Also has dizziness on standing and reports feeling very weak.  Covid-19 test is positive.  Clinical Impression  Pt did well with mobility, balance, ambulation and generally was near her baseline with no overt safety issues.  On room air her O2 remained in the 90s, generally >95% with ~120 ft of ambulation and though she endorsed some minimal fatigue this was not at all a significant limiter.  She showed good overall general tolerance and effort and agrees that she does not need further PT f/u.  Did encourage her to try walking with staff while here to maintain some activity and ease back into her normal activity level when home and once  herquarantine is up.      Follow Up Recommendations No PT follow up    Equipment Recommendations  None recommended by PT    Recommendations for Other Services       Precautions / Restrictions Precautions Precautions: Fall Restrictions Weight Bearing Restrictions: No      Mobility  Bed Mobility Overal bed mobility: Independent             General bed mobility comments: no hesitancy getting easily to EOB  Transfers Overall transfer level: Independent Equipment used: None             General transfer comment: Pt was able to rise to standing w/o issue and with good overall safety and confidence  Ambulation/Gait Ambulation/Gait assistance: Supervision Gait Distance (Feet): 120 Feet Assistive device: None(did use IV pole occasionally but did not need for safety)       General Gait Details:  Pt with easy, confident gait considering relatively tight quarters in the room and minimal straight-aways  Stairs            Wheelchair Mobility    Modified Rankin (Stroke Patients Only)       Balance Overall balance assessment: Independent                                           Pertinent Vitals/Pain Pain Assessment: No/denies pain    Home Living Family/patient expects to be discharged to:: Private residence Living Arrangements: Children   Type of Home: House         Home Equipment: Environmental consultant - 2 wheels;Bedside commode      Prior Function Level of Independence: Independent         Comments: Pt does not drive much, but is able to be out, run errands, does not use RW     Hand Dominance        Extremity/Trunk Assessment   Upper Extremity Assessment Upper Extremity Assessment: Overall WFL for tasks assessed    Lower Extremity Assessment Lower Extremity Assessment: Overall WFL for tasks assessed       Communication   Communication: No difficulties  Cognition Arousal/Alertness: Awake/alert Behavior During Therapy: WFL for tasks assessed/performed Overall Cognitive Status: Within Functional Limits for tasks assessed  General Comments General comments (skin integrity, edema, etc.): On room air pt's O2 remained in the mid 90s t/o the effiort, HR up minimally to 110s, but generally staying ~100 bpm    Exercises     Assessment/Plan    PT Assessment Patent does not need any further PT services  PT Problem List         PT Treatment Interventions      PT Goals (Current goals can be found in the Care Plan section)  Acute Rehab PT Goals Patient Stated Goal: go home PT Goal Formulation: All assessment and education complete, DC therapy    Frequency     Barriers to discharge        Co-evaluation               AM-PAC PT "6 Clicks" Mobility  Outcome Measure Help  needed turning from your back to your side while in a flat bed without using bedrails?: None Help needed moving from lying on your back to sitting on the side of a flat bed without using bedrails?: None Help needed moving to and from a bed to a chair (including a wheelchair)?: None Help needed standing up from a chair using your arms (e.g., wheelchair or bedside chair)?: None Help needed to walk in hospital room?: None Help needed climbing 3-5 steps with a railing? : None 6 Click Score: 24    End of Session   Activity Tolerance: Patient tolerated treatment well Patient left: with chair alarm set;with call bell/phone within reach   PT Visit Diagnosis: Muscle weakness (generalized) (M62.81);Difficulty in walking, not elsewhere classified (R26.2)    Time: 6270-3500 PT Time Calculation (min) (ACUTE ONLY): 35 min   Charges:   PT Evaluation $PT Eval Low Complexity: 1 Low PT Treatments $Gait Training: 8-22 mins        Kreg Shropshire, DPT 09/29/2019, 6:02 PM

## 2019-09-29 NOTE — Progress Notes (Signed)
Labs reviewed this AM, Cr down to 2.82.  Serum bicarb quite low at 11.  Discontinue NS and switch to sodium bicarb 144meq given at 75cc/hr and monitor renal function acid/base status.  No indication for dialysis.

## 2019-09-30 DIAGNOSIS — I5022 Chronic systolic (congestive) heart failure: Secondary | ICD-10-CM

## 2019-09-30 DIAGNOSIS — J449 Chronic obstructive pulmonary disease, unspecified: Secondary | ICD-10-CM

## 2019-09-30 DIAGNOSIS — C155 Malignant neoplasm of lower third of esophagus: Secondary | ICD-10-CM

## 2019-09-30 LAB — MAGNESIUM: Magnesium: 1.5 mg/dL — ABNORMAL LOW (ref 1.7–2.4)

## 2019-09-30 LAB — COMPREHENSIVE METABOLIC PANEL
ALT: 27 U/L (ref 0–44)
AST: 28 U/L (ref 15–41)
Albumin: 2.9 g/dL — ABNORMAL LOW (ref 3.5–5.0)
Alkaline Phosphatase: 36 U/L — ABNORMAL LOW (ref 38–126)
Anion gap: 10 (ref 5–15)
BUN: 35 mg/dL — ABNORMAL HIGH (ref 8–23)
CO2: 17 mmol/L — ABNORMAL LOW (ref 22–32)
Calcium: 7.7 mg/dL — ABNORMAL LOW (ref 8.9–10.3)
Chloride: 114 mmol/L — ABNORMAL HIGH (ref 98–111)
Creatinine, Ser: 2.29 mg/dL — ABNORMAL HIGH (ref 0.44–1.00)
GFR calc Af Amer: 25 mL/min — ABNORMAL LOW (ref >60)
GFR calc non Af Amer: 22 mL/min — ABNORMAL LOW (ref >60)
Glucose, Bld: 92 mg/dL (ref 70–99)
Potassium: 2.8 mmol/L — ABNORMAL LOW (ref 3.5–5.1)
Sodium: 141 mmol/L (ref 135–145)
Total Bilirubin: 0.6 mg/dL (ref 0.3–1.2)
Total Protein: 5.8 g/dL — ABNORMAL LOW (ref 6.5–8.1)

## 2019-09-30 LAB — CBC WITH DIFFERENTIAL/PLATELET
Abs Immature Granulocytes: 0.01 10*3/uL (ref 0.00–0.07)
Basophils Absolute: 0 10*3/uL (ref 0.0–0.1)
Basophils Relative: 0 %
Eosinophils Absolute: 0 10*3/uL (ref 0.0–0.5)
Eosinophils Relative: 0 %
HCT: 22 % — ABNORMAL LOW (ref 36.0–46.0)
Hemoglobin: 7.3 g/dL — ABNORMAL LOW (ref 12.0–15.0)
Immature Granulocytes: 0 %
Lymphocytes Relative: 16 %
Lymphs Abs: 0.7 10*3/uL (ref 0.7–4.0)
MCH: 30.8 pg (ref 26.0–34.0)
MCHC: 33.2 g/dL (ref 30.0–36.0)
MCV: 92.8 fL (ref 80.0–100.0)
Monocytes Absolute: 0.6 10*3/uL (ref 0.1–1.0)
Monocytes Relative: 13 %
Neutro Abs: 3.2 10*3/uL (ref 1.7–7.7)
Neutrophils Relative %: 71 %
Platelets: 174 10*3/uL (ref 150–400)
RBC: 2.37 MIL/uL — ABNORMAL LOW (ref 3.87–5.11)
RDW: 13.5 % (ref 11.5–15.5)
WBC: 4.5 10*3/uL (ref 4.0–10.5)
nRBC: 0 % (ref 0.0–0.2)

## 2019-09-30 LAB — MULTIPLE MYELOMA PANEL, SERUM
Albumin SerPl Elph-Mcnc: 4.1 g/dL (ref 2.9–4.4)
Albumin/Glob SerPl: 1.2 (ref 0.7–1.7)
Alpha 1: 0.2 g/dL (ref 0.0–0.4)
Alpha2 Glob SerPl Elph-Mcnc: 0.9 g/dL (ref 0.4–1.0)
B-Globulin SerPl Elph-Mcnc: 0.9 g/dL (ref 0.7–1.3)
Gamma Glob SerPl Elph-Mcnc: 1.5 g/dL (ref 0.4–1.8)
Globulin, Total: 3.5 g/dL (ref 2.2–3.9)
IgA: 200 mg/dL (ref 87–352)
IgG (Immunoglobin G), Serum: 1664 mg/dL — ABNORMAL HIGH (ref 586–1602)
IgM (Immunoglobulin M), Srm: 33 mg/dL (ref 26–217)
M Protein SerPl Elph-Mcnc: 0.9 g/dL — ABNORMAL HIGH
Total Protein ELP: 7.6 g/dL (ref 6.0–8.5)

## 2019-09-30 LAB — FERRITIN: Ferritin: 570 ng/mL — ABNORMAL HIGH (ref 11–307)

## 2019-09-30 LAB — PHOSPHORUS: Phosphorus: 1.5 mg/dL — ABNORMAL LOW (ref 2.5–4.6)

## 2019-09-30 LAB — FIBRIN DERIVATIVES D-DIMER (ARMC ONLY): Fibrin derivatives D-dimer (ARMC): 527.09 ng/mL (FEU) — ABNORMAL HIGH (ref 0.00–499.00)

## 2019-09-30 LAB — C-REACTIVE PROTEIN: CRP: 0.7 mg/dL (ref <1.0)

## 2019-09-30 IMAGING — CR DG CHEST 2V
2 series · 2 of 2 positions shown · non-contrast
Comparison: 05/07/2018 and earlier. CT Abdomen and Pelvis
05/11/2018

CLINICAL DATA: 62-year-old female with respiratory failure.

EXAM:
CHEST - 2 VIEW

[chest ap]
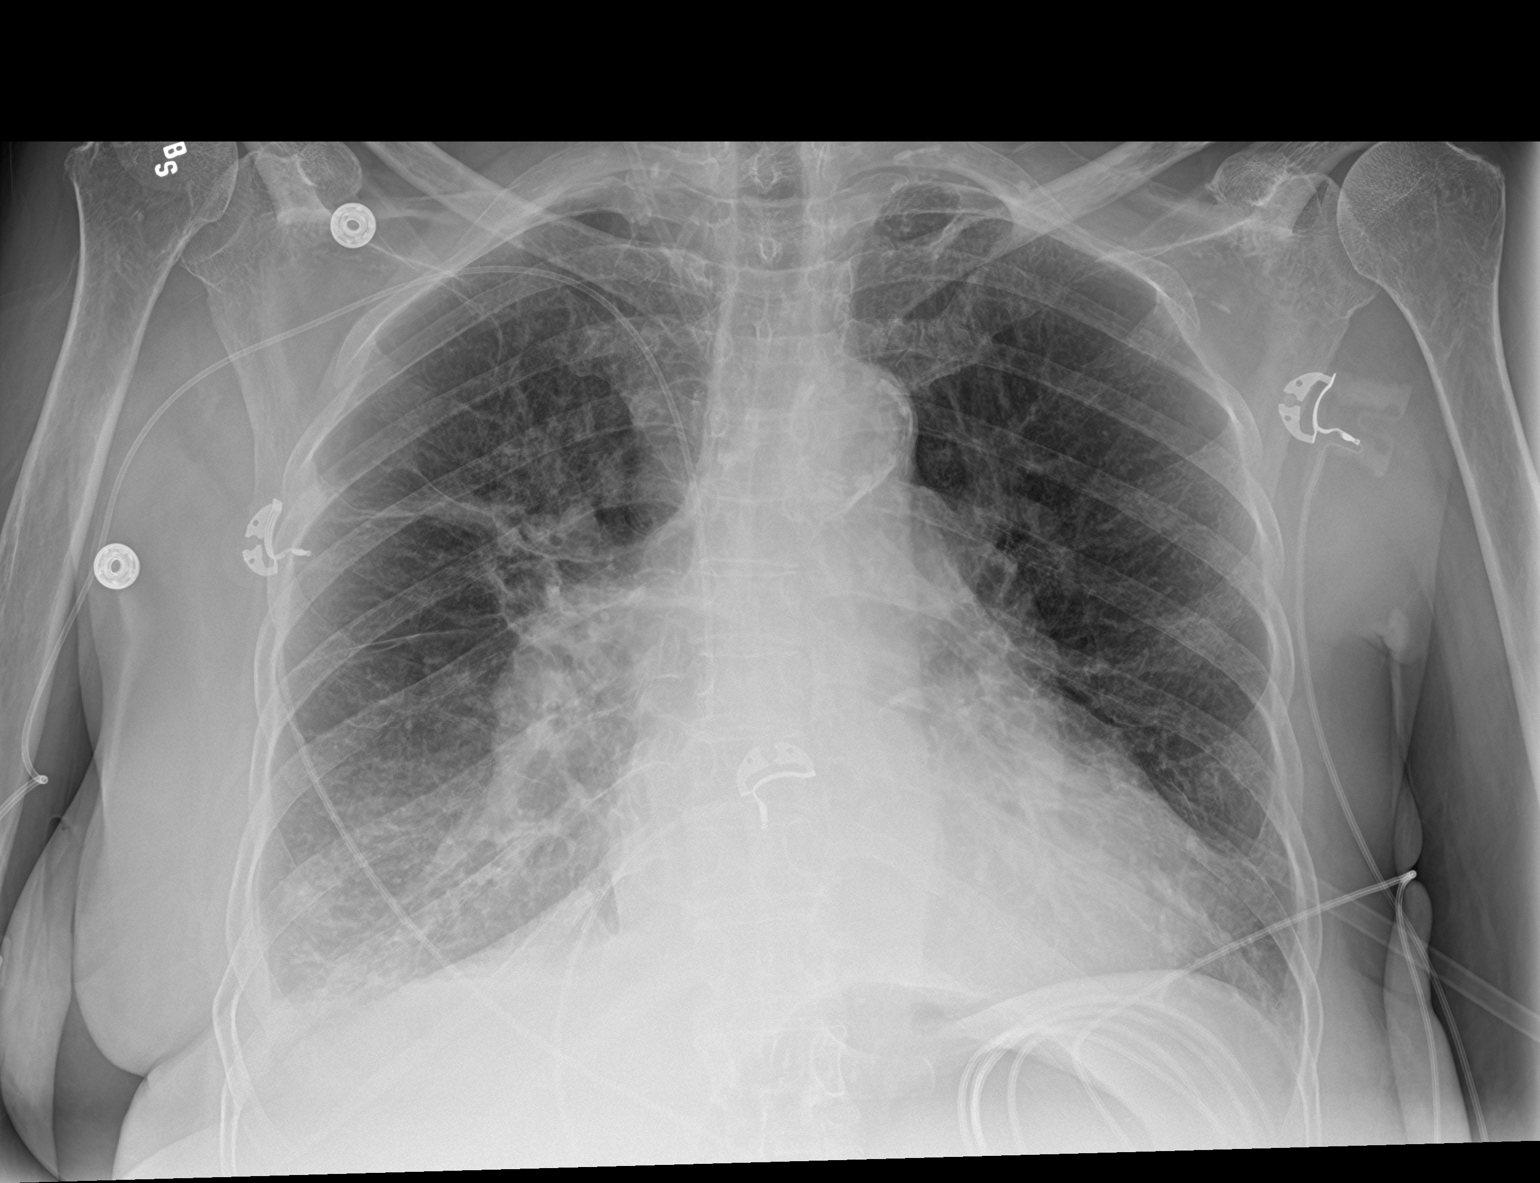

[chest lat]
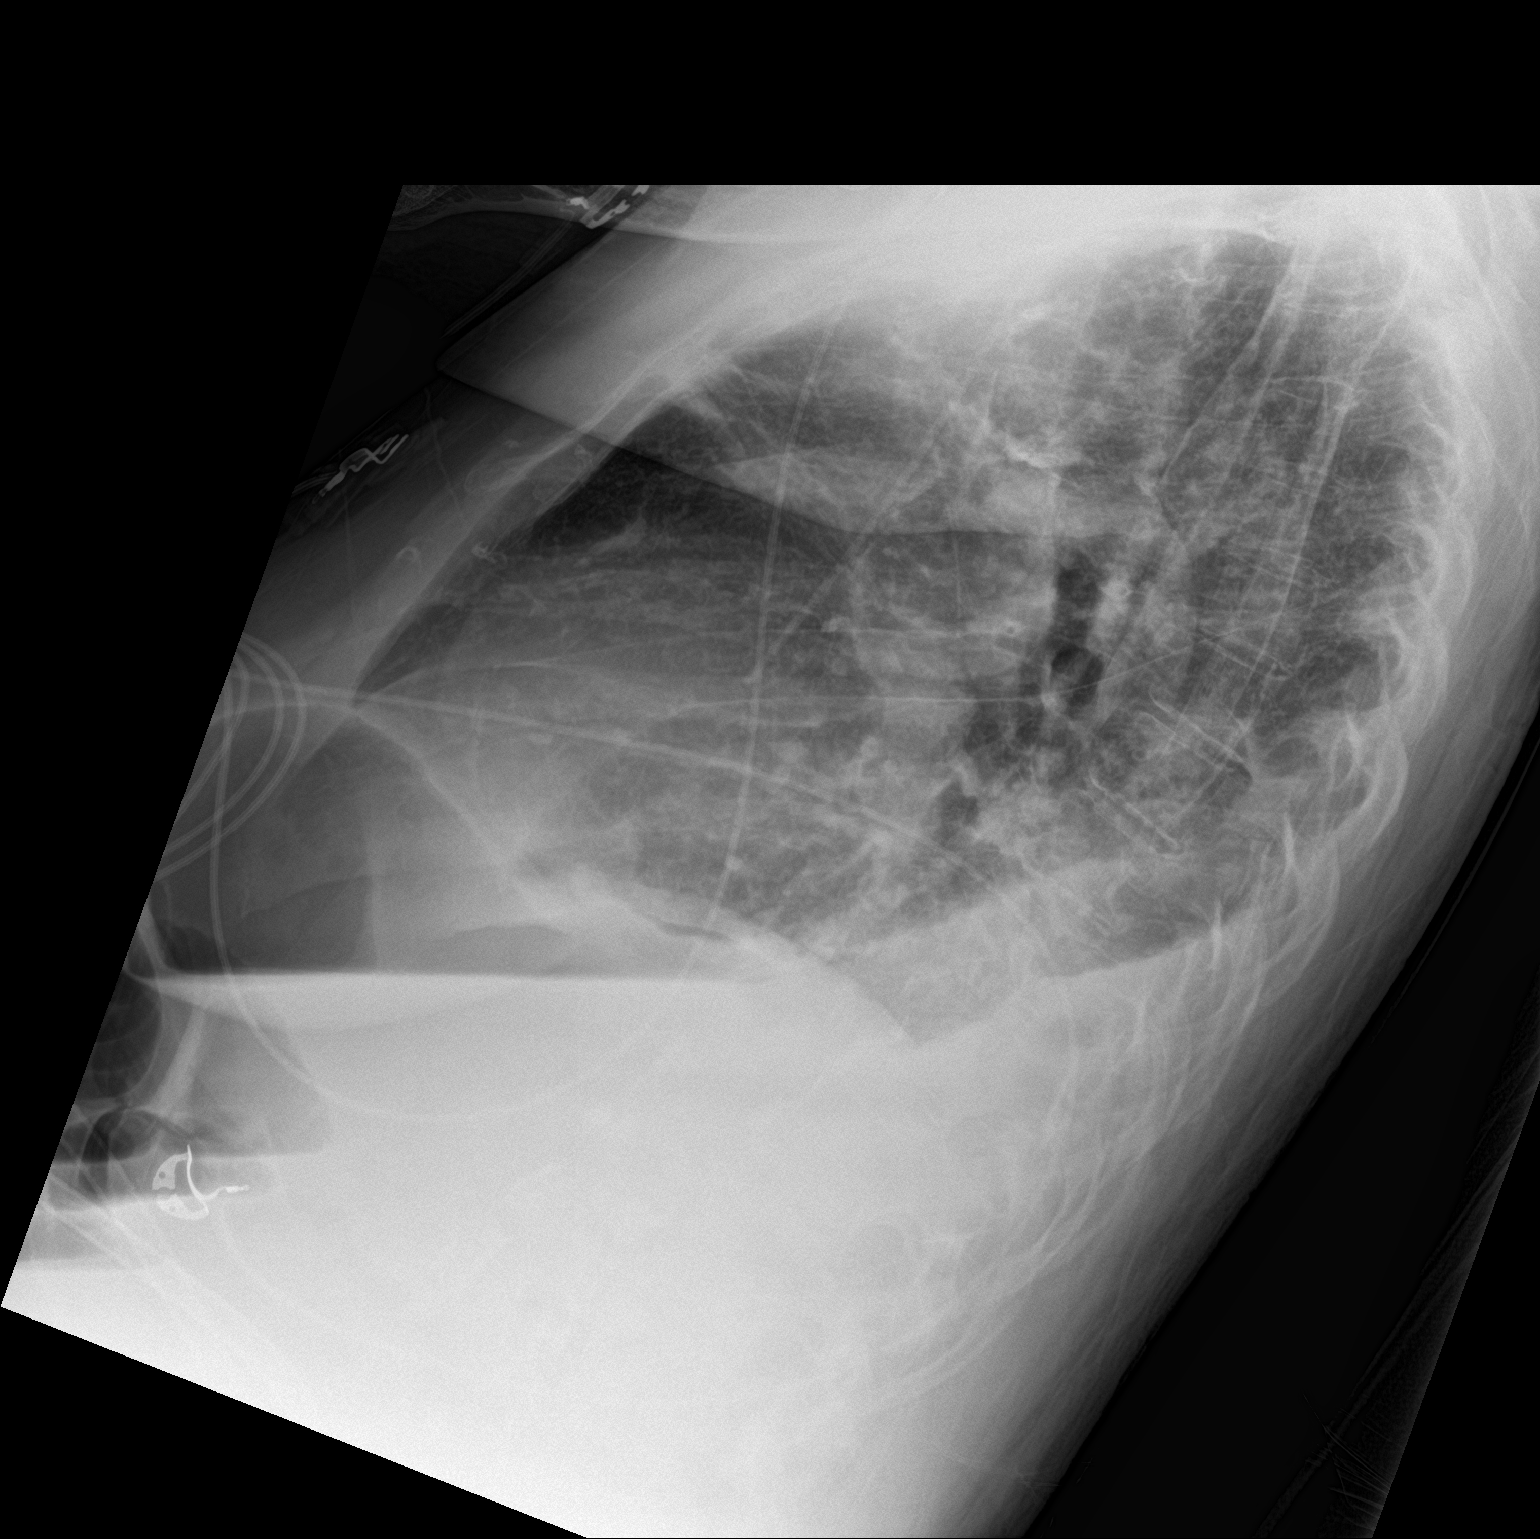

[2 of 2 positions shown; findings below may reference images not displayed]

FINDINGS: Seated AP and lateral views of the chest. Extubated and enteric tube
removed. Right PICC line remains in place.

Small bilateral pleural effusions persist but likely have decreased
since 05/11/2018. Stable cardiomegaly and mediastinal contours.
Calcified aortic atherosclerosis. Visualized tracheal air column is
within normal limits. No pneumothorax. Stable pulmonary vascularity,
no acute edema. Chronic right perihilar lung scarring. Osteopenia.
No acute osseous abnormality identified. Mild gaseous distension of
bowel in the visible abdomen similar to the recent CT.
IMPRESSION: 1. Small bilateral pleural effusions, probably regressed since
05/11/2018.
[DATE]. No other acute cardiopulmonary abnormality.
3. Visible bowel gas pattern appears similar to that on the recent
CT Abdomen and Pelvis.

## 2019-09-30 IMAGING — CT CT HEAD W/O CM
3 of 6 series · 15 of 47 positions shown, 18 images · non-contrast
Comparison: CT scan of January 27, 2012.

CLINICAL DATA: Altered level of consciousness, encephalopathy.

EXAM:
CT HEAD WITHOUT CONTRAST
TECHNIQUE: Contiguous axial images were obtained from the base of the skull
through the vertex without intravenous contrast.

[Series 2: head wo · axial · 0.47mm/px · z∈[-142,-12]mm · 10 of 30 slices shown, 13 images]
[im 2/30  brain]
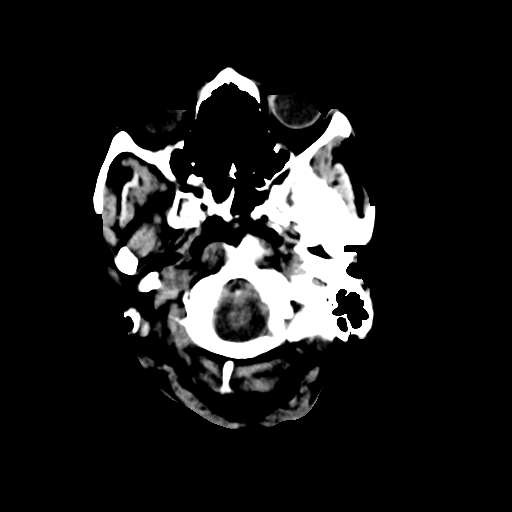
[im 2/30  bone]
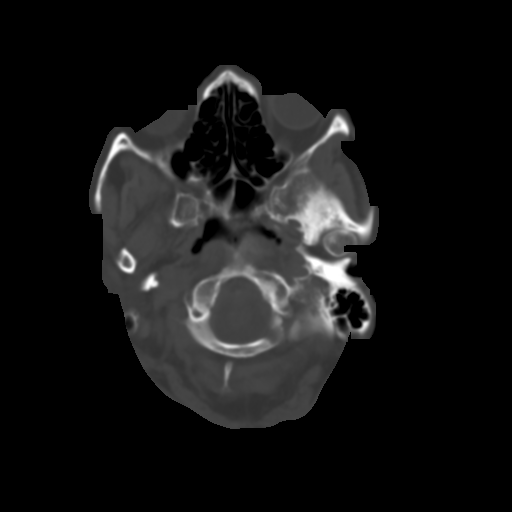
[im 5/30  brain]
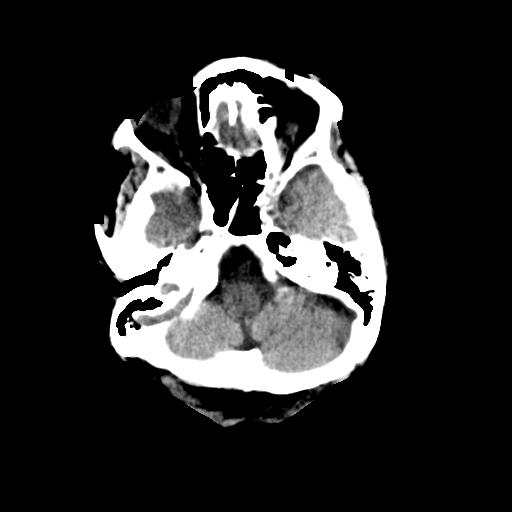
[im 9/30  brain]
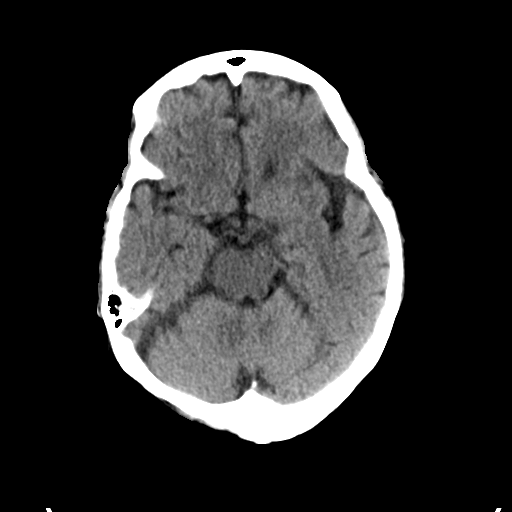
[im 10/30  brain]
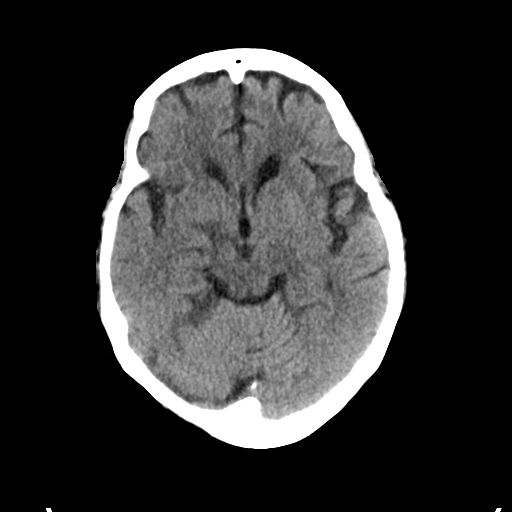
[im 13/30  brain]
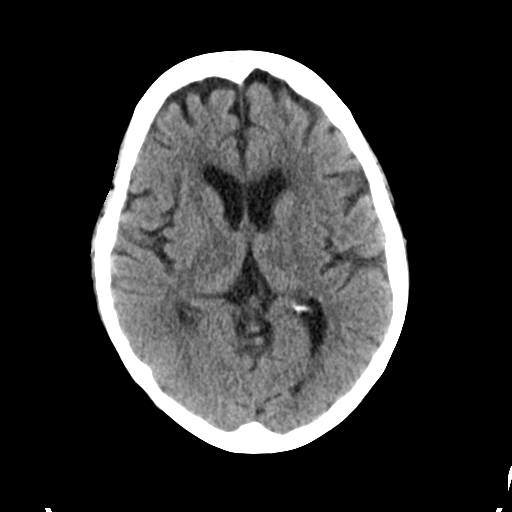
[im 13/30  bone]
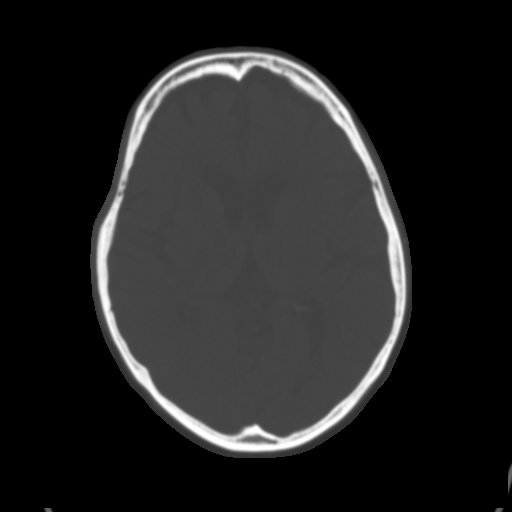
[im 17/30  brain]
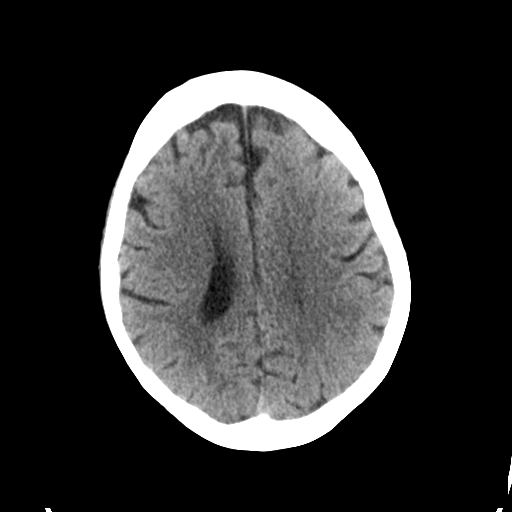
[im 20/30  brain]
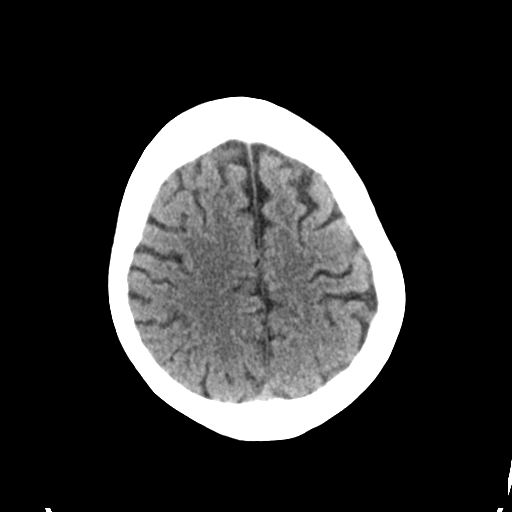
[im 21/30  brain]
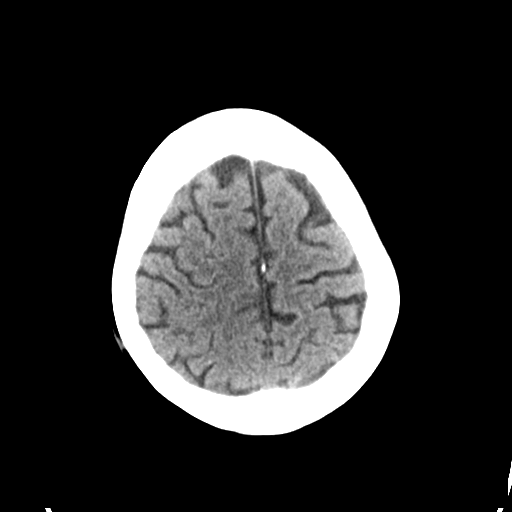
[im 25/30  brain]
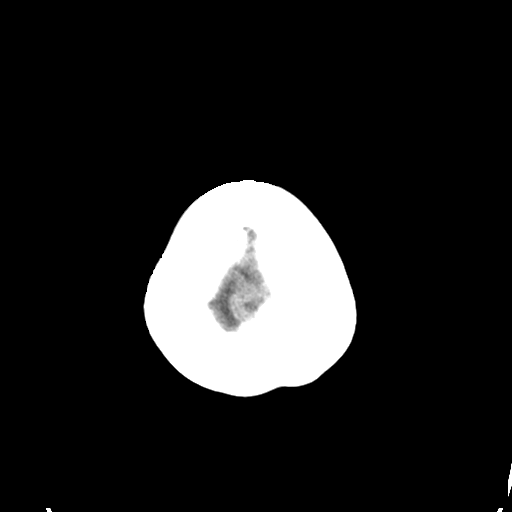
[im 25/30  bone]
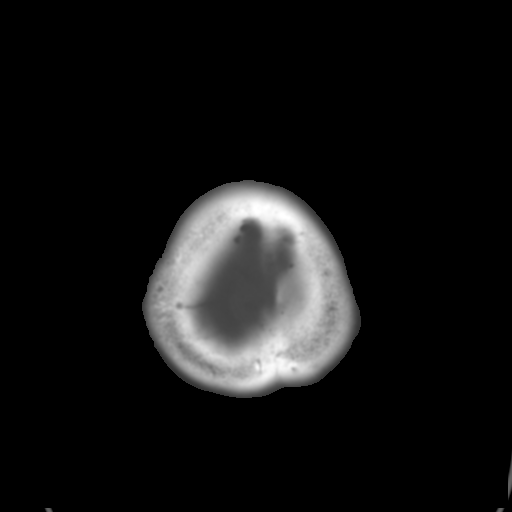
[im 28/30  brain]
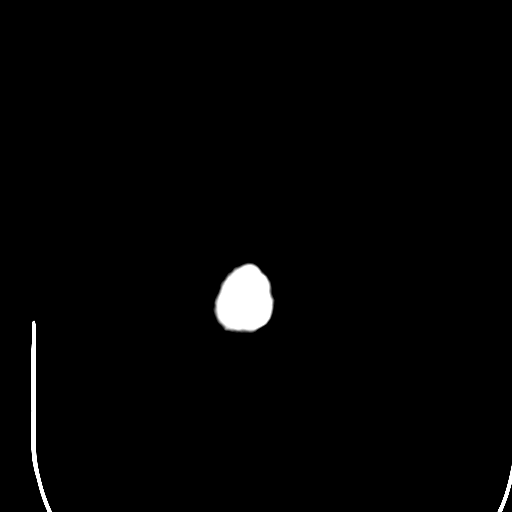

[Series 4: coronal soft tissue · coronal · 0.29mm/px · 3 of 64 slices shown]
[im 16/64  brain]
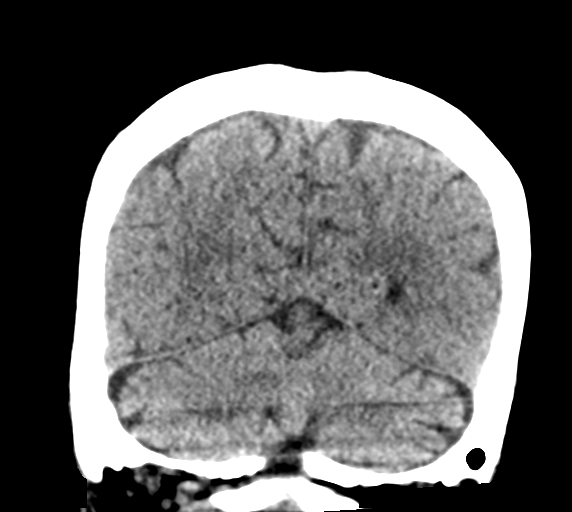
[im 32/64  brain]
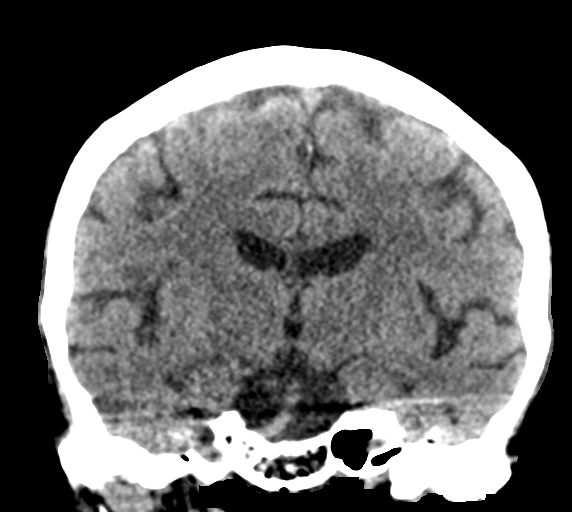
[im 48/64  brain]
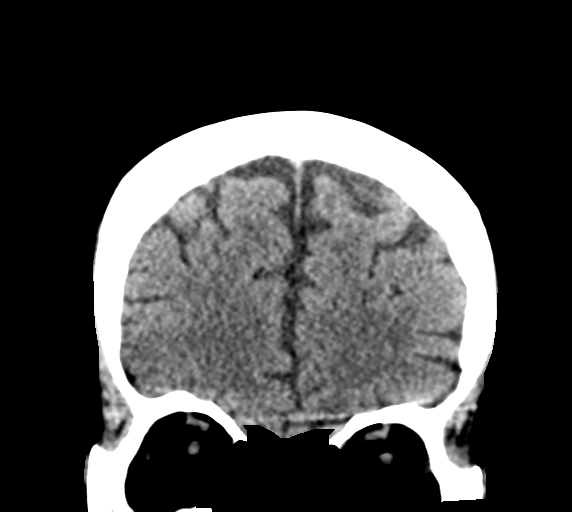

[Series 5: sagittal soft tissue · sagittal · 0.30mm/px · 2 of 50 slices shown]
[im 17/50  brain]
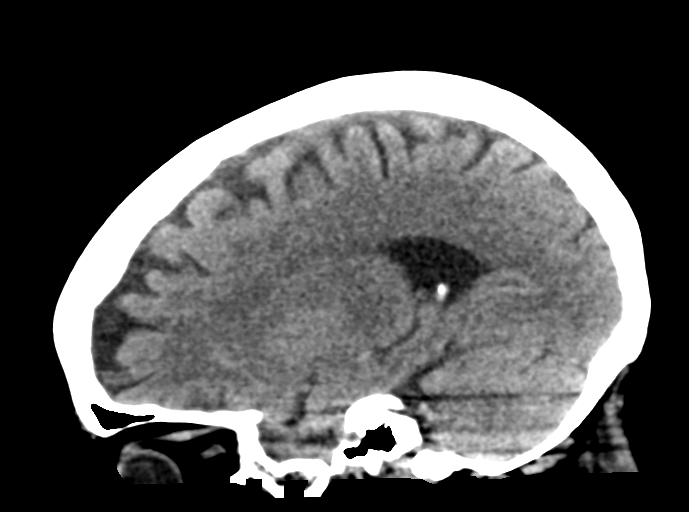
[im 33/50  brain]
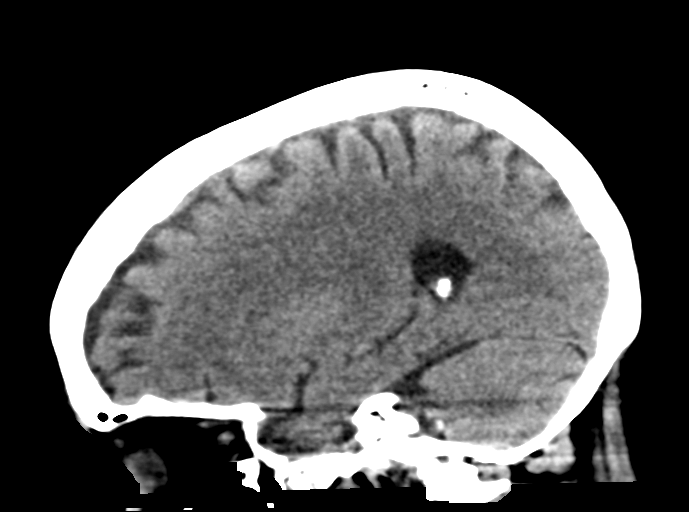

[15 of 47 positions shown; findings below may reference images not displayed]

FINDINGS: Brain: Mild diffuse cortical atrophy is noted. Mild chronic ischemic
white matter disease is noted. No mass effect or midline shift is
noted. Ventricular size is within normal limits. There is no
evidence of mass lesion, hemorrhage or acute infarction.

Vascular: No hyperdense vessel or unexpected calcification.

Skull: Normal. Negative for fracture or focal lesion.

Sinuses/Orbits: No acute finding.

Other: None.
IMPRESSION: Mild diffuse cortical atrophy. Mild chronic ischemic white matter
disease. No acute intracranial abnormality seen.

## 2019-09-30 MED ORDER — POTASSIUM PHOSPHATE MONOBASIC 500 MG PO TABS
500.0000 mg | ORAL_TABLET | Freq: Three times a day (TID) | ORAL | Status: AC
  Administered 2019-09-30 (×3): 500 mg via ORAL
  Filled 2019-09-30 (×4): qty 1

## 2019-09-30 MED ORDER — MAGNESIUM SULFATE 2 GM/50ML IV SOLN
2.0000 g | Freq: Once | INTRAVENOUS | Status: AC
  Administered 2019-09-30: 2 g via INTRAVENOUS
  Filled 2019-09-30: qty 50

## 2019-09-30 MED ORDER — DEXAMETHASONE SODIUM PHOSPHATE 4 MG/ML IJ SOLN
6.0000 mg | INTRAMUSCULAR | Status: DC
  Administered 2019-09-30 – 2019-10-01 (×3): 6 mg via INTRAVENOUS
  Filled 2019-09-30 (×4): qty 1.5

## 2019-09-30 NOTE — Progress Notes (Signed)
PROGRESS NOTE    Danielle [name]  EVO:[id number] DOB: 06/15/1956 DOA: 09/26/2019 PCP: [facility]      Brief Narrative:  Mrs. Danielle [name] is a 63 y.o. 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  In the ER, SpO2 <90% with exertion.  COVID+.  CXR without new opacities.      Assessment & Plan:  Coronavirus pneumonitis with acute hypoxic respiratory failure In the setting of the ongoing 2020 COVID-19 pandemic.  -Continue remdesivir day 4 of 5 -Continue Decadron day 5  -VTE PPx with heparin -Continue Zinc and Vitamin C -Flutter valve, turn, cough, incentive spirometry q2hrs while awake -Nutrition consult    AKI on CKD IV Creatinine 3.0 on admission, trended up to 4.5, now improved to 2.29 today   Anemia of CKD IV Hgb 7.3 today, trended down from 11 at admission.  No clinical bleeding. -Follow FOBT -Continue iron   Hypomagnesemia Hypophosphatemia Hypokalemia -Supplement mag, K, phos  Metabolic acidosis, non-gap From Renal failure.  Hyperchloremic.  FLuids switched to bicarb in water 12/15 -Continue bicarb in IVF  Hypetension BP 100s, 120s yesterday -Continue carvedilol  Chronic systolic CHF EF 29-93% in 2019. -Hold Lasix   Mood  -Continue citalopram  COPD  No flare -Continue LABA-LAMA-ICS  At risk for malnutrition -Continue megestrol  History breast cancer  History esophageal cancer with esophagitis and gastritis -Continue sucralfate, pantoprazole       MDM and disposition: The below labs and imaging reports were reviewed and summarized above.  Medication management as above.  The patient was admitted with COVID 19 and AKI.    Renal function is still elevated.  Still recovering from COVID    DVT prophylaxis: Heparin Code Status: FULL Family  Communication:      Procedures:     Antimicrobials:   Azithromycin x1 on admission   Culture data:   12/13 blood culture x2 -- NG        Subjective: Making urine.  No dyspnea, swelling.  No respiratory distress, fever.  Objective: Vitals:   09/29/19 1453 09/29/19 2032 09/30/19 0551 09/30/19 1208  BP: 107/71 120/77 109/69 105/68  Pulse: 73 78 65 (!) 102  Resp: '18 18 18   '$ Temp: 98.8 F (37.1 C) 98.5 F (36.9 C) 98.7 F (37.1 C) 98.4 F (36.9 C)  TempSrc: Oral Oral Oral Oral  SpO2: 100% 99% 99% 99%  Weight:      Height:        Intake/Output Summary (Last 24 hours) at 09/30/2019 1810 Last data filed at 09/30/2019 1433 Gross per 24 hour  Intake 1073.6 ml  Output 1125 ml  Net -51.4 ml   Filed Weights   09/26/19 2044  Weight: 55.3 kg    Examination: General appearance: adult female, alert and in no acute distress.   HEENT: Anicteric, conjunctiva pink, lids and lashes normal. No nasal deformity, discharge, epistaxis.  Lips moist.   Skin: Warm and dry.   No suspicious rashes or lesions. Cardiac: RRR, nl S1-S2, no murmurs appreciated.  Capillary refill is brisk.  JVP normal.  Trace bilateral LE edema.  Radia  pulses 2+ and symmetric. Respiratory: Normal respiratory rate and rhythm.  CTAB without rales or wheezes. Abdomen: Abdomen soft.  No TTP or guarding. No ascites, distension, hepatosplenomegaly.   MSK: No deformities or  effusions. Neuro: Awake and alert.  EOMI, moves all extremities. Speech fluent.    Psych: Sensorium intact and responding to questions, attention normal. Affect normal.  Judgment and insight appear normal.    Data Reviewed: I have personally reviewed following labs and imaging studies:  CBC: Recent Labs  Lab 09/25/19 1025 09/26/19 2048 09/28/19 0422 09/29/19 0530 09/30/19 0522  WBC 5.4 5.5 4.0 3.9* 4.5  NEUTROABS 3.1  --  3.0 3.3 3.2  HGB 10.2* 11.1* 9.5* 8.0* 7.3*  HCT 33.1* 34.4* 29.2* 24.0* 22.0*  MCV 100.3* 97.2 95.4 92.0  92.8  PLT 185 220 196 175 528   Basic Metabolic Panel: Recent Labs  Lab 09/25/19 1025 09/26/19 2210 09/27/19 0239 09/28/19 0422 09/29/19 0530 09/30/19 0522  NA 134* 131*  --  133* 137 141  K 4.3 3.9  --  3.9 4.3 2.8*  CL 110 106  --  110 118* 114*  CO2 15* 12*  --  12* 11* 17*  GLUCOSE 105* 121*  --  127* 114* 92  BUN 44* 61*  --  64* 49* 35*  CREATININE 3.05* 4.50* 4.28* 3.58* 2.82* 2.29*  CALCIUM 9.4 9.7  --  8.9 8.0* 7.7*  MG  --   --   --  2.2 2.0 1.5*  PHOS  --   --   --  3.5 2.9 1.5*   GFR: Estimated Creatinine Clearance: 21.4 mL/min (A) (by C-G formula based on SCr of 2.29 mg/dL (H)). Liver Function Tests: Recent Labs  Lab 09/25/19 1025 09/28/19 0422 09/29/19 0530 09/30/19 0522  AST 36 32 46* 28  ALT 24 21 33 27  ALKPHOS 49 43 38 36*  BILITOT 0.6 0.7 0.6 0.6  PROT 8.4* 7.9 6.4* 5.8*  ALBUMIN 4.2 3.7 3.1* 2.9*   No results for input(s): LIPASE, AMYLASE in the last 168 hours. No results for input(s): AMMONIA in the last 168 hours. Coagulation Profile: No results for input(s): INR, PROTIME in the last 168 hours. Cardiac Enzymes: No results for input(s): CKTOTAL, CKMB, CKMBINDEX, TROPONINI in the last 168 hours. BNP (last 3 results) No results for input(s): PROBNP in the last 8760 hours. HbA1C: No results for input(s): HGBA1C in the last 72 hours. CBG: No results for input(s): GLUCAP in the last 168 hours. Lipid Profile: No results for input(s): CHOL, HDL, LDLCALC, TRIG, CHOLHDL, LDLDIRECT in the last 72 hours. Thyroid Function Tests: No results for input(s): TSH, T4TOTAL, FREET4, T3FREE, THYROIDAB in the last 72 hours. Anemia Panel: Recent Labs    09/29/19 0530 09/30/19 0522  VITAMINB12 1,303*  --   FOLATE 20.2  --   FERRITIN 691* 570*  TIBC 212*  --   IRON 182*  --    Urine analysis:    Component Value Date/Time   COLORURINE YELLOW (A) 09/28/2019 0225   APPEARANCEUR HAZY (A) 09/28/2019 0225   APPEARANCEUR Clear 12/06/2013 2245   LABSPEC  1.018 09/28/2019 0225   LABSPEC 1.015 12/06/2013 2245   PHURINE 5.0 09/28/2019 0225   GLUCOSEU NEGATIVE 09/28/2019 0225   GLUCOSEU Negative 12/06/2013 2245   HGBUR NEGATIVE 09/28/2019 0225   BILIRUBINUR NEGATIVE 09/28/2019 0225   BILIRUBINUR Negative 12/06/2013 2245   KETONESUR NEGATIVE 09/28/2019 0225   PROTEINUR 30 (A) 09/28/2019 0225   NITRITE NEGATIVE 09/28/2019 0225   LEUKOCYTESUR NEGATIVE 09/28/2019 0225   LEUKOCYTESUR Negative 12/06/2013 2245   Sepsis Labs: '@LABRCNTIP'$ (procalcitonin:4,lacticacidven:4)  ) Recent Results (from the past 240 hour(s))  Blood Culture (routine x 2)     Status: None (  Preliminary result)   Collection Time: 09/27/19  1:41 AM   Specimen: BLOOD  Result Value Ref Range Status   Specimen Description BLOOD RIGHT FOREARM  Final   Special Requests   Final    BOTTLES DRAWN AEROBIC AND ANAEROBIC Blood Culture adequate volume   Culture   Final    NO GROWTH 3 DAYS Performed at [facility], [address], [place], [place] [address]    Report Status PENDING  Incomplete  Blood Culture (routine x 2)     Status: None (Preliminary result)   Collection Time: 09/27/19  1:41 AM   Specimen: BLOOD  Result Value Ref Range Status   Specimen Description BLOOD RIGHT ANTECUBITAL  Final   Special Requests   Final    BOTTLES DRAWN AEROBIC AND ANAEROBIC Blood Culture adequate volume   Culture   Final    NO GROWTH 3 DAYS Performed at [facility], [address] [place], [place] [address]    Report Status PENDING  Incomplete  Respiratory Panel by PCR     Status: None   Collection Time: 09/27/19  2:39 AM   Specimen: Nasopharyngeal Swab; Respiratory  Result Value Ref Range Status   Adenovirus NOT DETECTED NOT DETECTED Final   Coronavirus 229E NOT DETECTED NOT DETECTED Final    Comment: (NOTE) The Coronavirus on the Respiratory Panel, DOES NOT test for the novel  Coronavirus (2019 nCoV)    Coronavirus HKU1 NOT DETECTED NOT DETECTED Final    Coronavirus NL63 NOT DETECTED NOT DETECTED Final   Coronavirus OC43 NOT DETECTED NOT DETECTED Final   Metapneumovirus NOT DETECTED NOT DETECTED Final   Rhinovirus / Enterovirus NOT DETECTED NOT DETECTED Final   Influenza A NOT DETECTED NOT DETECTED Final   Influenza B NOT DETECTED NOT DETECTED Final   Parainfluenza Virus 1 NOT DETECTED NOT DETECTED Final   Parainfluenza Virus 2 NOT DETECTED NOT DETECTED Final   Parainfluenza Virus 3 NOT DETECTED NOT DETECTED Final   Parainfluenza Virus 4 NOT DETECTED NOT DETECTED Final   Respiratory Syncytial Virus NOT DETECTED NOT DETECTED Final   Bordetella pertussis NOT DETECTED NOT DETECTED Final   Chlamydophila pneumoniae NOT DETECTED NOT DETECTED Final   Mycoplasma pneumoniae NOT DETECTED NOT DETECTED Final    Comment: Performed at [facility], [place] [address] [place], [place] [address]         Radiology Studies: No results found.      Scheduled Meds: . albuterol  2 puff Inhalation Q6H  . calcium-vitamin D  0.5 tablet Oral Daily  . carvedilol  3.125 mg Oral BID WC  . citalopram  40 mg Oral Daily  . dexamethasone (DECADRON) injection  6 mg Intravenous Q24H  . ferrous sulfate  325 mg Oral BID WC  . fluticasone furoate-vilanterol  1 puff Inhalation Daily   And  . umeclidinium bromide  1 puff Inhalation Daily  . heparin  5,000 Units Subcutaneous Q8H  . megestrol  800 mg Oral Daily  . multivitamin with minerals  1 tablet Oral Daily  . pantoprazole  40 mg Oral BID AC  . potassium phosphate (monobasic)  500 mg Oral TID WC & HS  . sucralfate  1 g Oral QID  . vitamin C  500 mg Oral Daily  . zinc sulfate  220 mg Oral Daily   Continuous Infusions: . remdesivir 100 mg in NS 100 mL 100 mg (09/30/19 1150)  .  sodium bicarbonate (isotonic) infusion in sterile water 75 mL/hr at 09/30/19  0327     LOS: 3 days    Time spent: 35 minutes      Edwin Dada, MD [facility] 09/30/2019, 6:10 PM     Please page  through AMION:  [email or web address] Contact charge nurse for password If 7PM-7AM, please contact night-coverage

## 2019-10-01 LAB — CBC WITH DIFFERENTIAL/PLATELET
Abs Immature Granulocytes: 0.02 10*3/uL (ref 0.00–0.07)
Basophils Absolute: 0 10*3/uL (ref 0.0–0.1)
Basophils Relative: 0 %
Eosinophils Absolute: 0 10*3/uL (ref 0.0–0.5)
Eosinophils Relative: 0 %
HCT: 20.9 % — ABNORMAL LOW (ref 36.0–46.0)
Hemoglobin: 7.5 g/dL — ABNORMAL LOW (ref 12.0–15.0)
Immature Granulocytes: 1 %
Lymphocytes Relative: 16 %
Lymphs Abs: 0.6 10*3/uL — ABNORMAL LOW (ref 0.7–4.0)
MCH: 31.3 pg (ref 26.0–34.0)
MCHC: 35.9 g/dL (ref 30.0–36.0)
MCV: 87.1 fL (ref 80.0–100.0)
Monocytes Absolute: 0.3 10*3/uL (ref 0.1–1.0)
Monocytes Relative: 10 %
Neutro Abs: 2.5 10*3/uL (ref 1.7–7.7)
Neutrophils Relative %: 73 %
Platelets: 170 10*3/uL (ref 150–400)
RBC: 2.4 MIL/uL — ABNORMAL LOW (ref 3.87–5.11)
RDW: 13.1 % (ref 11.5–15.5)
WBC: 3.4 10*3/uL — ABNORMAL LOW (ref 4.0–10.5)
nRBC: 0.6 % — ABNORMAL HIGH (ref 0.0–0.2)

## 2019-10-01 LAB — COMPREHENSIVE METABOLIC PANEL
ALT: 25 U/L (ref 0–44)
AST: 25 U/L (ref 15–41)
Albumin: 2.8 g/dL — ABNORMAL LOW (ref 3.5–5.0)
Alkaline Phosphatase: 33 U/L — ABNORMAL LOW (ref 38–126)
Anion gap: 10 (ref 5–15)
BUN: 25 mg/dL — ABNORMAL HIGH (ref 8–23)
CO2: 23 mmol/L (ref 22–32)
Calcium: 7.4 mg/dL — ABNORMAL LOW (ref 8.9–10.3)
Chloride: 107 mmol/L (ref 98–111)
Creatinine, Ser: 2.03 mg/dL — ABNORMAL HIGH (ref 0.44–1.00)
GFR calc Af Amer: 29 mL/min — ABNORMAL LOW (ref >60)
GFR calc non Af Amer: 25 mL/min — ABNORMAL LOW (ref >60)
Glucose, Bld: 116 mg/dL — ABNORMAL HIGH (ref 70–99)
Potassium: 3 mmol/L — ABNORMAL LOW (ref 3.5–5.1)
Sodium: 140 mmol/L (ref 135–145)
Total Bilirubin: 0.7 mg/dL (ref 0.3–1.2)
Total Protein: 5.6 g/dL — ABNORMAL LOW (ref 6.5–8.1)

## 2019-10-01 LAB — FERRITIN: Ferritin: 557 ng/mL — ABNORMAL HIGH (ref 11–307)

## 2019-10-01 LAB — C-REACTIVE PROTEIN: CRP: 0.6 mg/dL (ref <1.0)

## 2019-10-01 LAB — PHOSPHORUS: Phosphorus: <1 mg/dL — CL (ref 2.5–4.6)

## 2019-10-01 LAB — MAGNESIUM: Magnesium: 1.9 mg/dL (ref 1.7–2.4)

## 2019-10-01 LAB — FIBRIN DERIVATIVES D-DIMER (ARMC ONLY): Fibrin derivatives D-dimer (ARMC): 462.51 ng/mL (FEU) (ref 0.00–499.00)

## 2019-10-01 MED ORDER — POTASSIUM PHOSPHATES 15 MMOLE/5ML IV SOLN
30.0000 mmol | Freq: Once | INTRAVENOUS | Status: AC
  Administered 2019-10-01: 30 mmol via INTRAVENOUS
  Filled 2019-10-01: qty 10

## 2019-10-01 NOTE — Progress Notes (Signed)
PROGRESS NOTE    Danielle [name]  YKZ:[id number] DOB: October 20, 1955 DOA: 09/26/2019 PCP: [facility]      Brief Narrative:  Danielle [name] is a 63 y.o. 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  In the ER, SpO2 <90% with exertion.  COVID+.  CXR without new opacities.      Assessment & Plan:  Coronavirus pneumonitis with acute hypoxic respiratory failure In the setting of the ongoing 2020 COVID-19 pandemic.  Off o2, inflammatory markers low and  Improved on treatment -Continue remdesivir day 5 of 5 -Continue Decadron day 6 -Continue VTE PPx with heparin -Continue Zinc and Vitamin C -Flutter valve, turn, cough, incentive spirometry q2hrs while awake -Nutrition consult    AKI on CKD IV Creatinine 3.0 on admission, trended up to 4.5, now improved again to 2.0 today   Hypokalemia Hypophosphatemia In setting of bicarb infusion for AKI related met-acidosis -Aggressive supplementation  Anemia of CKD IV Hgb up to  7.5 today, trended down from 11 at admission.  No clinical bleeding.  Suspect this is baseline, diluted. -Continue iron   Hypomagnesemia Repleted  Metabolic acidosis, non-gap From Renal failure.  Resolved. -Stop IVF  Hypetension BPcontrolled -Continue carvedilol  Chronic systolic CHF EF 93-90% in 2019. -Hold Lasix   Mood  -Continue citalopram  COPD  No flare -Continue LABA-LAMA-ICS  At risk for malnutrition -Continue megestrol  History breast cancer  History esophageal cancer with esophagitis and gastritis -Continue sucralfate, pantoprazole       MDM and disposition: The below labs and imaging reports reviewed and summarized above.  Medication management as above.    The patient was admitted with COVID 19 and AKI.    Her renal function is  improving, but her IV fluid treatment has led to severe hypophosphatemia.  We will aggressively supplement potassium and phosphate over the next 24 hours, hopefully home tomorrow.    DVT prophylaxis: Heparin Code Status: FULL Family Communication:      Consultants:   Nephrology  Antimicrobials:   Azithromycin x1 on admission   Culture data:   12/13 blood culture x2 -- NG        Subjective: Urine output good.  No fever, respiratory distress, cough, dyspnea, leg swelling, arm swelling, dyspnea on exertion or with orthopnea.  Objective: Vitals:   09/30/19 0551 09/30/19 1208 09/30/19 2022 10/01/19 0502  BP: 109/69 105/68 100/71 114/73  Pulse: 65 (!) 102 62 (!) 109  Resp: 18   20  Temp: 98.7 F (37.1 C) 98.4 F (36.9 C) 98.8 F (37.1 C) 99.4 F (37.4 C)  TempSrc: Oral Oral Oral Oral  SpO2: 99% 99% 98% 95%  Weight:      Height:        Intake/Output Summary (Last 24 hours) at 10/01/2019 1130 Last data filed at 10/01/2019 0755 Gross per 24 hour  Intake 240 ml  Output 1175 ml  Net -935 ml   Filed Weights   09/26/19 2044  Weight: 55.3 kg    Examination: General appearance:  adult female, alert and in no acute distress.  Appears tired. HEENT: Anicteric, conjunctiva pink, lids and lashes normal. No nasal deformity, discharge, epistaxis.  Lips moist, oropharynx tacky dry, no oral lesions.   Skin: Warm and dry.  No suspicious rashes or lesions. Cardiac: RRR, no murmurs  appreciated.  No LE edema.    Respiratory: Normal respiratory rate and rhythm.  CTAB without rales or wheezes. Abdomen: Abdomen soft.  No tenderness palpation or guarding No ascites, distension, hepatosplenomegaly.   MSK: No deformities or effusions of the large joints of the upper or lower extremities bilaterally. Neuro: Awake and alert. Naming is grossly intact, and the patient's recall, recent and remote, as well as general fund of knowledge seem within normal limits.  Muscle tone normal,  without fasciculations.  Moves all extremities equally and with normal coordination.  [name] Speech fluent.    Psych: Sensorium intact and responding to questions, attention normal. Affect normal.  Judgment and insight appear normal.      Data Reviewed: I have personally reviewed following labs and imaging studies:  CBC: Recent Labs  Lab 09/25/19 1025 09/26/19 2048 09/28/19 0422 09/29/19 0530 09/30/19 0522 10/01/19 0349  WBC 5.4 5.5 4.0 3.9* 4.5 3.4*  NEUTROABS 3.1  --  3.0 3.3 3.2 2.5  HGB 10.2* 11.1* 9.5* 8.0* 7.3* 7.5*  HCT 33.1* 34.4* 29.2* 24.0* 22.0* 20.9*  MCV 100.3* 97.2 95.4 92.0 92.8 87.1  PLT 185 220 196 175 174 867   Basic Metabolic Panel: Recent Labs  Lab 09/26/19 2210 09/27/19 0239 09/28/19 0422 09/29/19 0530 09/30/19 0522 10/01/19 0349  NA 131*  --  133* 137 141 140  K 3.9  --  3.9 4.3 2.8* 3.0*  CL 106  --  110 118* 114* 107  CO2 12*  --  12* 11* 17* 23  GLUCOSE 121*  --  127* 114* 92 116*  BUN 61*  --  64* 49* 35* 25*  CREATININE 4.50* 4.28* 3.58* 2.82* 2.29* 2.03*  CALCIUM 9.7  --  8.9 8.0* 7.7* 7.4*  MG  --   --  2.2 2.0 1.5* 1.9  PHOS  --   --  3.5 2.9 1.5* <1.0*   GFR: Estimated Creatinine Clearance: 24.1 mL/min (A) (by C-G formula based on SCr of 2.03 mg/dL (H)). Liver Function Tests: Recent Labs  Lab 09/25/19 1025 09/28/19 0422 09/29/19 0530 09/30/19 0522 10/01/19 0349  AST 36 32 46* 28 25  ALT 24 21 33 27 25  ALKPHOS 49 43 38 36* 33*  BILITOT 0.6 0.7 0.6 0.6 0.7  PROT 8.4* 7.9 6.4* 5.8* 5.6*  ALBUMIN 4.2 3.7 3.1* 2.9* 2.8*   No results for input(s): LIPASE, AMYLASE in the last 168 hours. No results for input(s): AMMONIA in the last 168 hours. Coagulation Profile: No results for input(s): INR, PROTIME in the last 168 hours. Cardiac Enzymes: No results for input(s): CKTOTAL, CKMB, CKMBINDEX, TROPONINI in the last 168 hours. BNP (last 3 results) No results for input(s): PROBNP in the last 8760 hours. HbA1C: No results for  input(s): HGBA1C in the last 72 hours. CBG: No results for input(s): GLUCAP in the last 168 hours. Lipid Profile: No results for input(s): CHOL, HDL, LDLCALC, TRIG, CHOLHDL, LDLDIRECT in the last 72 hours. Thyroid Function Tests: No results for input(s): TSH, T4TOTAL, FREET4, T3FREE, THYROIDAB in the last 72 hours. Anemia Panel: Recent Labs    09/29/19 0530 09/30/19 0522 10/01/19 0349  VITAMINB12 1,303*  --   --   FOLATE 20.2  --   --   FERRITIN 691* 570* 557*  TIBC 212*  --   --   IRON 182*  --   --    Urine analysis:    Component Value Date/Time   COLORURINE YELLOW (A) 09/28/2019 0225   APPEARANCEUR HAZY (A)  09/28/2019 0225   APPEARANCEUR Clear 12/06/2013 2245   LABSPEC 1.018 09/28/2019 0225   LABSPEC 1.015 12/06/2013 2245   PHURINE 5.0 09/28/2019 0225   GLUCOSEU NEGATIVE 09/28/2019 0225   GLUCOSEU Negative 12/06/2013 2245   HGBUR NEGATIVE 09/28/2019 0225   BILIRUBINUR NEGATIVE 09/28/2019 0225   BILIRUBINUR Negative 12/06/2013 2245   KETONESUR NEGATIVE 09/28/2019 0225   PROTEINUR 30 (A) 09/28/2019 0225   NITRITE NEGATIVE 09/28/2019 0225   LEUKOCYTESUR NEGATIVE 09/28/2019 0225   LEUKOCYTESUR Negative 12/06/2013 2245   Sepsis Labs: '@LABRCNTIP'$ (procalcitonin:4,lacticacidven:4)  ) Recent Results (from the past 240 hour(s))  Blood Culture (routine x 2)     Status: None (Preliminary result)   Collection Time: 09/27/19  1:41 AM   Specimen: BLOOD  Result Value Ref Range Status   Specimen Description BLOOD RIGHT FOREARM  Final   Special Requests   Final    BOTTLES DRAWN AEROBIC AND ANAEROBIC Blood Culture adequate volume   Culture   Final    NO GROWTH 4 DAYS Performed at [facility], [address] [place], [place] [address]    Report Status PENDING  Incomplete  Blood Culture (routine x 2)     Status: None (Preliminary result)   Collection Time: 09/27/19  1:41 AM   Specimen: BLOOD  Result Value Ref Range Status   Specimen Description BLOOD RIGHT  ANTECUBITAL  Final   Special Requests   Final    BOTTLES DRAWN AEROBIC AND ANAEROBIC Blood Culture adequate volume   Culture   Final    NO GROWTH 4 DAYS Performed at [facility], [address] [place], [place] [address]    Report Status PENDING  Incomplete  Respiratory Panel by PCR     Status: None   Collection Time: 09/27/19  2:39 AM   Specimen: Nasopharyngeal Swab; Respiratory  Result Value Ref Range Status   Adenovirus NOT DETECTED NOT DETECTED Final   Coronavirus 229E NOT DETECTED NOT DETECTED Final    Comment: (NOTE) The Coronavirus on the Respiratory Panel, DOES NOT test for the novel  Coronavirus (2019 nCoV)    Coronavirus HKU1 NOT DETECTED NOT DETECTED Final   Coronavirus NL63 NOT DETECTED NOT DETECTED Final   Coronavirus OC43 NOT DETECTED NOT DETECTED Final   Metapneumovirus NOT DETECTED NOT DETECTED Final   Rhinovirus / Enterovirus NOT DETECTED NOT DETECTED Final   Influenza A NOT DETECTED NOT DETECTED Final   Influenza B NOT DETECTED NOT DETECTED Final   Parainfluenza Virus 1 NOT DETECTED NOT DETECTED Final   Parainfluenza Virus 2 NOT DETECTED NOT DETECTED Final   Parainfluenza Virus 3 NOT DETECTED NOT DETECTED Final   Parainfluenza Virus 4 NOT DETECTED NOT DETECTED Final   Respiratory Syncytial Virus NOT DETECTED NOT DETECTED Final   Bordetella pertussis NOT DETECTED NOT DETECTED Final   Chlamydophila pneumoniae NOT DETECTED NOT DETECTED Final   Mycoplasma pneumoniae NOT DETECTED NOT DETECTED Final    Comment: Performed at [facility], [address] [place], [place] [address]         Radiology Studies: No results found.      Scheduled Meds: . albuterol  2 puff Inhalation Q6H  . calcium-vitamin D  0.5 tablet Oral Daily  . carvedilol  3.125 mg Oral BID WC  . citalopram  40 mg Oral Daily  . dexamethasone (DECADRON) injection  6 mg Intravenous Q24H  . ferrous sulfate  325 mg Oral BID WC  . fluticasone furoate-vilanterol  1 puff  Inhalation Daily  And  . umeclidinium bromide  1 puff Inhalation Daily  . heparin  5,000 Units Subcutaneous Q8H  . megestrol  800 mg Oral Daily  . multivitamin with minerals  1 tablet Oral Daily  . pantoprazole  40 mg Oral BID AC  . sucralfate  1 g Oral QID  . vitamin C  500 mg Oral Daily  . zinc sulfate  220 mg Oral Daily   Continuous Infusions: . potassium PHOSPHATE IVPB (in mmol) 30 mmol (10/01/19 0913)     LOS: 4 days    Time spent: 25 minutes      Edwin Dada, MD [facility] 10/01/2019, 11:30 AM     Please page through [place]:  [email or web address] Contact charge nurse for password If 7PM-7AM, please contact night-coverage

## 2019-10-01 NOTE — Progress Notes (Signed)
Provider informed of phosphorus levels of <1.0

## 2019-10-01 NOTE — Care Management Important Message (Signed)
Important Message  Patient Details  Name: Danielle [name] MRN: [id number] Date of Birth: 11/01/1955   Medicare Important Message Given:  Yes - Important Message mailed due to current National Emergency  Reviewed verbally over phone with patient.  Requested copy of Medicare IM be sent to her home address on file.     [name] 10/01/2019, 10:17 AM

## 2019-10-02 LAB — COMPREHENSIVE METABOLIC PANEL
ALT: 22 U/L (ref 0–44)
AST: 23 U/L (ref 15–41)
Albumin: 3 g/dL — ABNORMAL LOW (ref 3.5–5.0)
Alkaline Phosphatase: 36 U/L — ABNORMAL LOW (ref 38–126)
Anion gap: 10 (ref 5–15)
BUN: 22 mg/dL (ref 8–23)
CO2: 23 mmol/L (ref 22–32)
Calcium: 7.4 mg/dL — ABNORMAL LOW (ref 8.9–10.3)
Chloride: 106 mmol/L (ref 98–111)
Creatinine, Ser: 2.12 mg/dL — ABNORMAL HIGH (ref 0.44–1.00)
GFR calc Af Amer: 28 mL/min — ABNORMAL LOW (ref >60)
GFR calc non Af Amer: 24 mL/min — ABNORMAL LOW (ref >60)
Glucose, Bld: 141 mg/dL — ABNORMAL HIGH (ref 70–99)
Potassium: 3.4 mmol/L — ABNORMAL LOW (ref 3.5–5.1)
Sodium: 139 mmol/L (ref 135–145)
Total Bilirubin: 0.6 mg/dL (ref 0.3–1.2)
Total Protein: 5.7 g/dL — ABNORMAL LOW (ref 6.5–8.1)

## 2019-10-02 LAB — CULTURE, BLOOD (ROUTINE X 2)
Culture: NO GROWTH
Culture: NO GROWTH
Special Requests: ADEQUATE
Special Requests: ADEQUATE

## 2019-10-02 LAB — CBC WITH DIFFERENTIAL/PLATELET
Abs Immature Granulocytes: 0.04 10*3/uL (ref 0.00–0.07)
Basophils Absolute: 0 10*3/uL (ref 0.0–0.1)
Basophils Relative: 0 %
Eosinophils Absolute: 0 10*3/uL (ref 0.0–0.5)
Eosinophils Relative: 0 %
HCT: 21.7 % — ABNORMAL LOW (ref 36.0–46.0)
Hemoglobin: 7.5 g/dL — ABNORMAL LOW (ref 12.0–15.0)
Immature Granulocytes: 1 %
Lymphocytes Relative: 13 %
Lymphs Abs: 0.6 10*3/uL — ABNORMAL LOW (ref 0.7–4.0)
MCH: 31.1 pg (ref 26.0–34.0)
MCHC: 34.6 g/dL (ref 30.0–36.0)
MCV: 90 fL (ref 80.0–100.0)
Monocytes Absolute: 0.3 10*3/uL (ref 0.1–1.0)
Monocytes Relative: 7 %
Neutro Abs: 3.5 10*3/uL (ref 1.7–7.7)
Neutrophils Relative %: 79 %
Platelets: 195 10*3/uL (ref 150–400)
RBC: 2.41 MIL/uL — ABNORMAL LOW (ref 3.87–5.11)
RDW: 13.7 % (ref 11.5–15.5)
WBC: 4.5 10*3/uL (ref 4.0–10.5)
nRBC: 0.7 % — ABNORMAL HIGH (ref 0.0–0.2)

## 2019-10-02 LAB — FIBRIN DERIVATIVES D-DIMER (ARMC ONLY): Fibrin derivatives D-dimer (ARMC): 419.66 ng/mL (FEU) (ref 0.00–499.00)

## 2019-10-02 LAB — MAGNESIUM: Magnesium: 1.5 mg/dL — ABNORMAL LOW (ref 1.7–2.4)

## 2019-10-02 LAB — C-REACTIVE PROTEIN: CRP: 0.6 mg/dL (ref <1.0)

## 2019-10-02 LAB — PHOSPHORUS: Phosphorus: 3.1 mg/dL (ref 2.5–4.6)

## 2019-10-02 LAB — FERRITIN: Ferritin: 444 ng/mL — ABNORMAL HIGH (ref 11–307)

## 2019-10-02 MED ORDER — POTASSIUM CHLORIDE ER 20 MEQ PO TBCR: 10.0000 meq | EXTENDED_RELEASE_TABLET | Freq: Every day | ORAL | 0 refills | Status: DC

## 2019-10-02 NOTE — Progress Notes (Signed)
Danielle [name] to be D/C'd Home per MD order.  Discussed prescriptions and follow up appointments with the patient. Prescriptions given to patient, medication list explained in detail. Pt verbalized understanding.  Allergies as of 10/02/2019   No Known Allergies     Medication List    TAKE these medications   acetaminophen 325 MG tablet Commonly known as: TYLENOL Take 2 tablets (650 mg total) by mouth every 6 (six) hours as needed for mild pain (or Fever >/= 101).   albuterol 108 (90 Base) MCG/ACT inhaler Commonly known as: VENTOLIN HFA Inhale 2 puffs into the lungs every 6 (six) hours as needed for wheezing or shortness of breath.   calcium-vitamin D 250-125 MG-UNIT tablet Commonly known as: OSCAL WITH D Take 1 tablet by mouth daily.   carvedilol 3.125 MG tablet Commonly known as: COREG Take 3.125 mg by mouth 2 (two) times daily with a meal.   citalopram 20 MG tablet Commonly known as: CELEXA Take 40 mg by mouth daily.   ferrous sulfate 325 (65 FE) MG tablet Take 1 tablet (325 mg total) by mouth 2 (two) times daily with a meal.   Fluticasone-Umeclidin-Vilant 100-62.5-25 MCG/INH Aepb Inhale 1 puff into the lungs daily.   furosemide 20 MG tablet Commonly known as: LASIX Take 1 tablet (20 mg total) by mouth daily. And additional 20mg  dose as needed What changed:   how much to take  when to take this  additional instructions   guaiFENesin-dextromethorphan 100-10 MG/5ML syrup Commonly known as: ROBITUSSIN DM Take 5 mLs by mouth every 4 (four) hours as needed for cough.   loperamide 2 MG tablet Commonly known as: Imodium A-D Take 1 tablet (2 mg total) by mouth as needed for diarrhea or loose stools.   megestrol 625 MG/5ML suspension Commonly known as: MEGACE ES Take 5 mLs (625 mg total) by mouth daily.   multivitamin with minerals Tabs tablet Take 1 tablet by mouth daily.   pantoprazole 40 MG tablet Commonly known as: Protonix Take 1 tablet (40 mg total) by  mouth 2 (two) times daily before a meal.   Potassium Chloride ER 20 MEQ Tbcr Take 10 mEq by mouth daily. What changed: medication strength Notes to patient: Take 20 mEq for one week then decrease to 10 mEq.    sucralfate 1 GM/10ML suspension Commonly known as: CARAFATE Take 10 mLs (1 g total) by mouth 4 (four) times daily.       Vitals:   10/01/19 2109 10/02/19 0712  BP: 106/73 106/80  Pulse: 80 80  Resp: 18 18  Temp: 98.7 F (37.1 C) 98 F (36.7 C)  SpO2: 95% 94%    Skin clean, dry and intact without evidence of skin break down, no evidence of skin tears noted. IV catheter discontinued intact. Site without signs and symptoms of complications. Dressing and pressure applied. Pt denies pain at this time. No complaints noted.  An After Visit Summary was printed and given to the patient. Patient escorted via [place], and D/C home via private auto.  Fuller Mandril, RN

## 2019-10-02 NOTE — Discharge Summary (Signed)
Physician Discharge Summary  Danielle [name] UUV:[id number] DOB: Jan 06, 1956 DOA: 09/26/2019  PCP: [facility] date: 09/26/2019 Discharge date: 10/02/2019  Admitted From: Home  Disposition:  Home   Recommendations for Outpatient Follow-up:  1. Follow up with PCP as soon as able 2. Remain in [place] isolation until Dec 31 3. Please obtain BMP in 1 week or as soon as able to check Cr, Phos, K       Home Health: None  Equipment/Devices: None  Discharge Condition: Fair  CODE STATUS: FULL Diet recommendation: Cardiac, Renal  Brief/Interim Summary: Danielle [name] is a 63 y.o. 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  In the ER, SpO2 <90% with exertion.  COVID+.  CXR without new opacities.          PRINCIPAL HOSPITAL DIAGNOSIS: COVID-19    Discharge Diagnoses:    Coronavirus pneumonitis with acute hypoxic respiratory failure Patient mated with multifocal pneumonia from Covid in the setting of the ongoing 2020 COVID-19 pandemic.  She was started on remdesivir and steroids.  Her inflammatory markers improved, and she was weaned off of oxygen, and was asymptomatic.    AKI on CKD IV Acute metabolic acidosis Creatinine baseline around 2, was 3.0 on admission, trended up to 4.5.  Nephrology were consulted, she was placed on fluids and bicarb drip, and her metabolic acidosis and renal failure improved, her urine output was good.  Creatinine resolved to 2-2.2 mg/dL by the day of discharge.   Hypokalemia Hypophosphatemia Resolved with supplementation.  Anemia of CKD IV Hgb 7.5 at discharge, trended down from 11 at admission.  No clinical bleeding.  Suspect this is baseline, diluted.  Hypertension  Chronic systolic CHF EF 47-42% in 2019.  Appeared euvolemic  at discharge.  COPD  No flare  At risk for malnutrition  History breast cancer  History esophageal cancer with esophagitis and gastritis             Discharge Instructions  Discharge Instructions    Discharge instructions   Complete by: As directed    From Dr. Nelva Bush were admitted for coronavirus (Also known as COVID-19) and also a kidney injury  For the kidney injury, you were treated with fluids and this got better.   You should resume all your home medicines. Take extra potassium (20 mEq) for the next week  Get your labs checked by your primary care doctor or your kidney doctor in 1 week or as soon as possible Make sure you have a telemedicine or an inperson appointment with your primary care doctor as soon as possible    For the COVID: You were treated with an anti-virus medicine ("remdesivir") and an anti-inflammatory (a "steroid") while you were here.  You completed both courses while you were here.  If you have any lingering cough, you should take the cough syrup we gave you here, Robitussin (with the ingredients "GUIAFENESIN" and "DEXTROMETHORPHAN")   You should purchase a pulse oximeter at your pharmacy. This is a device that you put on your finger to measure your oxygen level.  They are available at any pharmacy. Use it to check your oxygen level twice daily until you see your primary care doctor. If your oxygen level is ever LESS than 88% and doesn't get better, you should call your primary care  doctor immediately.   HOW LONG TO REMAIN IN QUARANTINE: There is no absolutely correct answer to this and so our best answer is to be on the cautious side.  Based on what we know of the virus, you should isolate strictly until 21 days from your first symptoms.  Until you end your quarantine: If you have anyone in the home who has NOT had coronavirus:    -do not be in the same room with them until your self isolation is over    -wear a mask and  have them wear a mask if you MUST be in the same room    -clean all hard surfaces (counters, doors, tables) twice a day    -use a separate bathroom at all times   Increase activity slowly   Complete by: As directed      Allergies as of 10/02/2019   No Known Allergies     Medication List    TAKE these medications   acetaminophen 325 MG tablet Commonly known as: TYLENOL Take 2 tablets (650 mg total) by mouth every 6 (six) hours as needed for mild pain (or Fever >/= 101).   albuterol 108 (90 Base) MCG/ACT inhaler Commonly known as: VENTOLIN HFA Inhale 2 puffs into the lungs every 6 (six) hours as needed for wheezing or shortness of breath.   calcium-vitamin D 250-125 MG-UNIT tablet Commonly known as: OSCAL WITH D Take 1 tablet by mouth daily.   carvedilol 3.125 MG tablet Commonly known as: COREG Take 3.125 mg by mouth 2 (two) times daily with a meal.   citalopram 20 MG tablet Commonly known as: CELEXA Take 40 mg by mouth daily.   ferrous sulfate 325 (65 FE) MG tablet Take 1 tablet (325 mg total) by mouth 2 (two) times daily with a meal.   Fluticasone-Umeclidin-Vilant 100-62.5-25 MCG/INH Aepb Inhale 1 puff into the lungs daily.   furosemide 20 MG tablet Commonly known as: LASIX Take 1 tablet (20 mg total) by mouth daily. And additional 52m dose as needed What changed:   how much to take  when to take this  additional instructions   guaiFENesin-dextromethorphan 100-10 MG/5ML syrup Commonly known as: ROBITUSSIN DM Take 5 mLs by mouth every 4 (four) hours as needed for cough.   loperamide 2 MG tablet Commonly known as: Imodium A-D Take 1 tablet (2 mg total) by mouth as needed for diarrhea or loose stools.   megestrol 625 MG/5ML suspension Commonly known as: MEGACE ES Take 5 mLs (625 mg total) by mouth daily.   multivitamin with minerals Tabs tablet Take 1 tablet by mouth daily.   pantoprazole 40 MG tablet Commonly known as: Protonix Take 1 tablet (40 mg  total) by mouth 2 (two) times daily before a meal.   Potassium Chloride ER 20 MEQ Tbcr Take 10 mEq by mouth daily. What changed: medication strength Notes to patient: Take 20 mEq for one week then decrease to 10 mEq.    sucralfate 1 GM/10ML suspension Commonly known as: CARAFATE Take 10 mLs (1 g total) by mouth 4 (four) times daily.      [facility]   Specialty: General Practice Why: Call for telemedicine appointment. Needs labs in 1 week if able, as soon as possible,1pm appointment Contact information: [address] [phone]          No Known Allergies  Consultations:  Nephrology   Procedures/Studies: UKoreaRENAL  Result  Date: 09/27/2019 CLINICAL DATA:  Acute kidney injury EXAM: RENAL / URINARY TRACT ULTRASOUND COMPLETE COMPARISON:  CT dated April 29, 2019 FINDINGS: Right Kidney: Renal measurements: 9.8 x 4.2 x 4.6 cm = volume: 100 mL. There is increased cortical echogenicity. There is no hydronephrosis. Left Kidney: Renal measurements: 9.5 x 4.5 x 4.9 cm = volume: 110 mL. There is increased cortical echogenicity without evidence for hydronephrosis Bladder: Appears normal for degree of bladder distention. Other: None. IMPRESSION: 1. No acute abnormality. 2. Echogenic kidneys bilaterally which can be seen in patients with medical renal disease. Electronically Signed   By: Constance Holster M.D.   On: 09/27/2019 18:46   DG Chest Port 1 View  Result Date: 09/26/2019 CLINICAL DATA:  Chest pressure, shortness of breath EXAM: PORTABLE CHEST 1 VIEW COMPARISON:  06/19/2019 FINDINGS: Heart size is normal. Stable mediastinal contours with chronic radiation fibrosis in the right perihilar region. Prominence of the left hilum is unchanged from priors. Chronic coarsened interstitial markings. No new focal airspace consolidation, pleural effusion, or pneumothorax. IMPRESSION: Chronic lung  changes without evidence of new or acute cardiopulmonary process. Electronically Signed   By: Davina Poke M.D.   On: 09/26/2019 23:42       Subjective: Feeling well.  No confusion.  No nausea.  No headache, fever, cough, dyspnea, chest pain.  Discharge Exam: Vitals:   10/01/19 2109 10/02/19 0712  BP: 106/73 106/80  Pulse: 80 80  Resp: 18 18  Temp: 98.7 F (37.1 C) 98 F (36.7 C)  SpO2: 95% 94%   Vitals:   10/01/19 0502 10/01/19 1207 10/01/19 2109 10/02/19 0712  BP: 114/73 105/75 106/73 106/80  Pulse: (!) 109 (!) 56 80 80  Resp: _0 Temp: 99.4 F (37.4 C)  98.7 F (37.1 C) 98 F (36.7 C)  TempSrc: Oral   Oral  SpO2: 95% 94% 95% 94%  Weight:      Height:        General: Pt is alert, awake, not in acute distress Cardiovascular: RRR, nl S1-S2, no murmurs appreciated.   No LE edema.   Respiratory: Normal respiratory rate and rhythm.  CTAB without rales or wheezes. Abdominal: Abdomen soft and non-tender.  No distension or HSM.   Neuro/Psych: Strength symmetric in upper and lower extremities.  Judgment and insight appear normal.   The results of significant diagnostics from this hospitalization (including imaging, microbiology, ancillary and laboratory) are listed below for reference.     Microbiology: Recent Results (from the past 240 hour(s))  Blood Culture (routine x 2)     Status: None   Collection Time: 09/27/19  1:41 AM   Specimen: BLOOD  Result Value Ref Range Status   Specimen Description BLOOD RIGHT FOREARM  Final   Special Requests   Final    BOTTLES DRAWN AEROBIC AND ANAEROBIC Blood Culture adequate volume   Culture   Final    NO GROWTH 5 DAYS Performed at [facility], [address] [place], [place] [address]    Report Status 10/02/2019 FINAL  Final  Blood Culture (routine x 2)     Status: None   Collection Time: 09/27/19  1:41 AM   Specimen: BLOOD  Result Value Ref Range Status   Specimen Description BLOOD RIGHT ANTECUBITAL   Final   Special Requests   Final    BOTTLES DRAWN AEROBIC AND ANAEROBIC Blood Culture adequate volume   Culture   Final    NO GROWTH 5 DAYS Performed at [facility]  [facility], [address] [place], [place] [address]    Report Status 10/02/2019 FINAL  Final  Respiratory Panel by PCR     Status: None   Collection Time: 09/27/19  2:39 AM   Specimen: Nasopharyngeal Swab; Respiratory  Result Value Ref Range Status   Adenovirus NOT DETECTED NOT DETECTED Final   Coronavirus 229E NOT DETECTED NOT DETECTED Final    Comment: (NOTE) The Coronavirus on the Respiratory Panel, DOES NOT test for the novel  Coronavirus (2019 nCoV)    Coronavirus HKU1 NOT DETECTED NOT DETECTED Final   Coronavirus NL63 NOT DETECTED NOT DETECTED Final   Coronavirus OC43 NOT DETECTED NOT DETECTED Final   Metapneumovirus NOT DETECTED NOT DETECTED Final   Rhinovirus / Enterovirus NOT DETECTED NOT DETECTED Final   Influenza A NOT DETECTED NOT DETECTED Final   Influenza B NOT DETECTED NOT DETECTED Final   Parainfluenza Virus 1 NOT DETECTED NOT DETECTED Final   Parainfluenza Virus 2 NOT DETECTED NOT DETECTED Final   Parainfluenza Virus 3 NOT DETECTED NOT DETECTED Final   Parainfluenza Virus 4 NOT DETECTED NOT DETECTED Final   Respiratory Syncytial Virus NOT DETECTED NOT DETECTED Final   Bordetella pertussis NOT DETECTED NOT DETECTED Final   Chlamydophila pneumoniae NOT DETECTED NOT DETECTED Final   Mycoplasma pneumoniae NOT DETECTED NOT DETECTED Final    Comment: Performed at [facility], [place]. [address] [place], [place] [address]     Labs: BNP (last 3 results) Recent Labs    12/22/18 0347  BNP 79.7   Basic Metabolic Panel: Recent Labs  Lab 09/28/19 0422 09/29/19 0530 09/30/19 0522 10/01/19 0349 10/02/19 0638  NA 133* 137 141 140 139  K 3.9 4.3 2.8* 3.0* 3.4*  CL 110 118* 114* 107 106  CO2 12* 11* 17* 23 23  GLUCOSE 127* 114* 92 116* 141*  BUN 64* 49* 35* 25* 22  CREATININE 3.58* 2.82*  2.29* 2.03* 2.12*  CALCIUM 8.9 8.0* 7.7* 7.4* 7.4*  MG 2.2 2.0 1.5* 1.9 1.5*  PHOS 3.5 2.9 1.5* <1.0* 3.1   Liver Function Tests: Recent Labs  Lab 09/28/19 0422 09/29/19 0530 09/30/19 0522 10/01/19 0349 10/02/19 0638  AST 32 46* _0 ALT 21 33 _1 ALKPHOS 43 38 36* 33* 36*  BILITOT 0.7 0.6 0.6 0.7 0.6  PROT 7.9 6.4* 5.8* 5.6* 5.7*  ALBUMIN 3.7 3.1* 2.9* 2.8* 3.0*   No results for input(s): LIPASE, AMYLASE in the last 168 hours. No results for input(s): AMMONIA in the last 168 hours. CBC: Recent Labs  Lab 09/28/19 0422 09/29/19 0530 09/30/19 0522 10/01/19 0349 10/02/19 0638  WBC 4.0 3.9* 4.5 3.4* 4.5  NEUTROABS 3.0 3.3 3.2 2.5 3.5  HGB 9.5* 8.0* 7.3* 7.5* 7.5*  HCT 29.2* 24.0* 22.0* 20.9* 21.7*  MCV 95.4 92.0 92.8 87.1 90.0  PLT 196 175 174 170 195   Cardiac Enzymes: No results for input(s): CKTOTAL, CKMB, CKMBINDEX, TROPONINI in the last 168 hours. BNP: Invalid input(s): POCBNP CBG: No results for input(s): GLUCAP in the last 168 hours. D-Dimer No results for input(s): DDIMER in the last 72 hours. Hgb A1c No results for input(s): HGBA1C in the last 72 hours. Lipid Profile No results for input(s): CHOL, HDL, LDLCALC, TRIG, CHOLHDL, LDLDIRECT in the last 72 hours. Thyroid function studies No results for input(s): TSH, T4TOTAL, T3FREE, THYROIDAB in the last 72 hours.  Invalid input(s): FREET3 Anemia work up [employer]    10/01/19 0349 10/02/19 [phone]  FERRITIN 557* 444*   Urinalysis    Component Value Date/Time   COLORURINE YELLOW (A) 09/28/2019 0225   APPEARANCEUR HAZY (A) 09/28/2019 0225   APPEARANCEUR Clear 12/06/2013 2245   LABSPEC 1.018 09/28/2019 0225   LABSPEC 1.015 12/06/2013 2245   PHURINE 5.0 09/28/2019 0225   GLUCOSEU NEGATIVE 09/28/2019 0225   GLUCOSEU Negative 12/06/2013 2245   HGBUR NEGATIVE 09/28/2019 0225   BILIRUBINUR NEGATIVE 09/28/2019 0225   BILIRUBINUR Negative 12/06/2013 2245   KETONESUR NEGATIVE 09/28/2019 0225    PROTEINUR 30 (A) 09/28/2019 0225   NITRITE NEGATIVE 09/28/2019 0225   LEUKOCYTESUR NEGATIVE 09/28/2019 0225   LEUKOCYTESUR Negative 12/06/2013 2245   Sepsis Labs Invalid input(s): PROCALCITONIN,  WBC,  LACTICIDVEN Microbiology Recent Results (from the past 240 hour(s))  Blood Culture (routine x 2)     Status: None   Collection Time: 09/27/19  1:41 AM   Specimen: BLOOD  Result Value Ref Range Status   Specimen Description BLOOD RIGHT FOREARM  Final   Special Requests   Final    BOTTLES DRAWN AEROBIC AND ANAEROBIC Blood Culture adequate volume   Culture   Final    NO GROWTH 5 DAYS Performed at [facility], [address] [place], [place] [address]    Report Status 10/02/2019 FINAL  Final  Blood Culture (routine x 2)     Status: None   Collection Time: 09/27/19  1:41 AM   Specimen: BLOOD  Result Value Ref Range Status   Specimen Description BLOOD RIGHT ANTECUBITAL  Final   Special Requests   Final    BOTTLES DRAWN AEROBIC AND ANAEROBIC Blood Culture adequate volume   Culture   Final    NO GROWTH 5 DAYS Performed at [facility], [address] [place], [place] [address]    Report Status 10/02/2019 FINAL  Final  Respiratory Panel by PCR     Status: None   Collection Time: 09/27/19  2:39 AM   Specimen: Nasopharyngeal Swab; Respiratory  Result Value Ref Range Status   Adenovirus NOT DETECTED NOT DETECTED Final   Coronavirus 229E NOT DETECTED NOT DETECTED Final    Comment: (NOTE) The Coronavirus on the Respiratory Panel, DOES NOT test for the novel  Coronavirus (2019 nCoV)    Coronavirus HKU1 NOT DETECTED NOT DETECTED Final   Coronavirus NL63 NOT DETECTED NOT DETECTED Final   Coronavirus OC43 NOT DETECTED NOT DETECTED Final   Metapneumovirus NOT DETECTED NOT DETECTED Final   Rhinovirus / Enterovirus NOT DETECTED NOT DETECTED Final   Influenza A NOT DETECTED NOT DETECTED Final   Influenza B NOT DETECTED NOT DETECTED Final   Parainfluenza Virus 1 NOT  DETECTED NOT DETECTED Final   Parainfluenza Virus 2 NOT DETECTED NOT DETECTED Final   Parainfluenza Virus 3 NOT DETECTED NOT DETECTED Final   Parainfluenza Virus 4 NOT DETECTED NOT DETECTED Final   Respiratory Syncytial Virus NOT DETECTED NOT DETECTED Final   Bordetella pertussis NOT DETECTED NOT DETECTED Final   Chlamydophila pneumoniae NOT DETECTED NOT DETECTED Final   Mycoplasma pneumoniae NOT DETECTED NOT DETECTED Final    Comment: Performed at [facility], [place] [address], [place], [place] [address]     Time coordinating discharge: 35 minutes The [place] controlled substances registry was reviewed for this patient.      SIGNED:   Edwin Dada, MD  [facility] 10/02/2019, 6:25 PM

## 2019-10-13 ENCOUNTER — Encounter: Payer: Self-pay | Admitting: Emergency Medicine

## 2019-10-13 ENCOUNTER — Other Ambulatory Visit: Payer: Self-pay

## 2019-10-13 DIAGNOSIS — D631 Anemia in chronic kidney disease: Secondary | ICD-10-CM | POA: Diagnosis present

## 2019-10-13 DIAGNOSIS — Z9221 Personal history of antineoplastic chemotherapy: Secondary | ICD-10-CM

## 2019-10-13 DIAGNOSIS — Z87891 Personal history of nicotine dependence: Secondary | ICD-10-CM

## 2019-10-13 DIAGNOSIS — Z923 Personal history of irradiation: Secondary | ICD-10-CM

## 2019-10-13 DIAGNOSIS — B948 Sequelae of other specified infectious and parasitic diseases: Secondary | ICD-10-CM

## 2019-10-13 DIAGNOSIS — Z803 Family history of malignant neoplasm of breast: Secondary | ICD-10-CM

## 2019-10-13 DIAGNOSIS — J441 Chronic obstructive pulmonary disease with (acute) exacerbation: Secondary | ICD-10-CM | POA: Diagnosis present

## 2019-10-13 DIAGNOSIS — Z9981 Dependence on supplemental oxygen: Secondary | ICD-10-CM

## 2019-10-13 DIAGNOSIS — I248 Other forms of acute ischemic heart disease: Secondary | ICD-10-CM | POA: Diagnosis present

## 2019-10-13 DIAGNOSIS — E1122 Type 2 diabetes mellitus with diabetic chronic kidney disease: Secondary | ICD-10-CM | POA: Diagnosis present

## 2019-10-13 DIAGNOSIS — E875 Hyperkalemia: Secondary | ICD-10-CM | POA: Diagnosis present

## 2019-10-13 DIAGNOSIS — N1832 Chronic kidney disease, stage 3b: Secondary | ICD-10-CM | POA: Diagnosis present

## 2019-10-13 DIAGNOSIS — Z85118 Personal history of other malignant neoplasm of bronchus and lung: Secondary | ICD-10-CM

## 2019-10-13 DIAGNOSIS — Z932 Ileostomy status: Secondary | ICD-10-CM

## 2019-10-13 DIAGNOSIS — I480 Paroxysmal atrial fibrillation: Secondary | ICD-10-CM | POA: Diagnosis present

## 2019-10-13 DIAGNOSIS — E871 Hypo-osmolality and hyponatremia: Secondary | ICD-10-CM | POA: Diagnosis present

## 2019-10-13 DIAGNOSIS — Z9012 Acquired absence of left breast and nipple: Secondary | ICD-10-CM

## 2019-10-13 DIAGNOSIS — E872 Acidosis: Secondary | ICD-10-CM | POA: Diagnosis not present

## 2019-10-13 DIAGNOSIS — Z8501 Personal history of malignant neoplasm of esophagus: Secondary | ICD-10-CM

## 2019-10-13 DIAGNOSIS — Z853 Personal history of malignant neoplasm of breast: Secondary | ICD-10-CM

## 2019-10-13 DIAGNOSIS — D62 Acute posthemorrhagic anemia: Secondary | ICD-10-CM | POA: Diagnosis present

## 2019-10-13 DIAGNOSIS — I130 Hypertensive heart and chronic kidney disease with heart failure and stage 1 through stage 4 chronic kidney disease, or unspecified chronic kidney disease: Secondary | ICD-10-CM | POA: Diagnosis present

## 2019-10-13 DIAGNOSIS — N170 Acute kidney failure with tubular necrosis: Principal | ICD-10-CM | POA: Diagnosis present

## 2019-10-13 DIAGNOSIS — E876 Hypokalemia: Secondary | ICD-10-CM | POA: Diagnosis not present

## 2019-10-13 NOTE — ED Triage Notes (Signed)
Pt to triage via w/c, mask in place with no distress noted; +COVID on 12/13 and admitted for hypokalemia per pt; pt reports persistent dizziness, generalized abd & back pain

## 2019-10-14 ENCOUNTER — Inpatient Hospital Stay
Admission: EM | Admit: 2019-10-14 | Discharge: 2019-10-21 | DRG: 683 | Disposition: A | Discharge status: Home Health | Payer: Medicare Other | Attending: Internal Medicine | Admitting: Internal Medicine

## 2019-10-14 DIAGNOSIS — D500 Iron deficiency anemia secondary to blood loss (chronic): Secondary | ICD-10-CM | POA: Diagnosis not present

## 2019-10-14 DIAGNOSIS — I10 Essential (primary) hypertension: Secondary | ICD-10-CM | POA: Diagnosis not present

## 2019-10-14 DIAGNOSIS — E1122 Type 2 diabetes mellitus with diabetic chronic kidney disease: Secondary | ICD-10-CM | POA: Diagnosis present

## 2019-10-14 DIAGNOSIS — E872 Acidosis: Secondary | ICD-10-CM | POA: Diagnosis not present

## 2019-10-14 DIAGNOSIS — Z8501 Personal history of malignant neoplasm of esophagus: Secondary | ICD-10-CM | POA: Diagnosis not present

## 2019-10-14 DIAGNOSIS — Z923 Personal history of irradiation: Secondary | ICD-10-CM | POA: Diagnosis not present

## 2019-10-14 DIAGNOSIS — J449 Chronic obstructive pulmonary disease, unspecified: Secondary | ICD-10-CM | POA: Diagnosis present

## 2019-10-14 DIAGNOSIS — E876 Hypokalemia: Secondary | ICD-10-CM | POA: Diagnosis not present

## 2019-10-14 DIAGNOSIS — D62 Acute posthemorrhagic anemia: Secondary | ICD-10-CM | POA: Diagnosis present

## 2019-10-14 DIAGNOSIS — N189 Chronic kidney disease, unspecified: Secondary | ICD-10-CM

## 2019-10-14 DIAGNOSIS — Z853 Personal history of malignant neoplasm of breast: Secondary | ICD-10-CM | POA: Diagnosis not present

## 2019-10-14 DIAGNOSIS — N170 Acute kidney failure with tubular necrosis: Secondary | ICD-10-CM | POA: Diagnosis present

## 2019-10-14 DIAGNOSIS — E871 Hypo-osmolality and hyponatremia: Secondary | ICD-10-CM | POA: Diagnosis present

## 2019-10-14 DIAGNOSIS — Z803 Family history of malignant neoplasm of breast: Secondary | ICD-10-CM | POA: Diagnosis not present

## 2019-10-14 DIAGNOSIS — D649 Anemia, unspecified: Secondary | ICD-10-CM | POA: Diagnosis not present

## 2019-10-14 DIAGNOSIS — I5022 Chronic systolic (congestive) heart failure: Secondary | ICD-10-CM | POA: Diagnosis not present

## 2019-10-14 DIAGNOSIS — Z8616 Personal history of COVID-19: Secondary | ICD-10-CM

## 2019-10-14 DIAGNOSIS — E875 Hyperkalemia: Secondary | ICD-10-CM

## 2019-10-14 DIAGNOSIS — Z9981 Dependence on supplemental oxygen: Secondary | ICD-10-CM | POA: Diagnosis not present

## 2019-10-14 DIAGNOSIS — J441 Chronic obstructive pulmonary disease with (acute) exacerbation: Secondary | ICD-10-CM | POA: Diagnosis present

## 2019-10-14 DIAGNOSIS — Z9221 Personal history of antineoplastic chemotherapy: Secondary | ICD-10-CM | POA: Diagnosis not present

## 2019-10-14 DIAGNOSIS — I130 Hypertensive heart and chronic kidney disease with heart failure and stage 1 through stage 4 chronic kidney disease, or unspecified chronic kidney disease: Secondary | ICD-10-CM | POA: Diagnosis present

## 2019-10-14 DIAGNOSIS — N179 Acute kidney failure, unspecified: Secondary | ICD-10-CM

## 2019-10-14 DIAGNOSIS — Z9012 Acquired absence of left breast and nipple: Secondary | ICD-10-CM | POA: Diagnosis not present

## 2019-10-14 DIAGNOSIS — Z85118 Personal history of other malignant neoplasm of bronchus and lung: Secondary | ICD-10-CM | POA: Diagnosis not present

## 2019-10-14 DIAGNOSIS — Z87891 Personal history of nicotine dependence: Secondary | ICD-10-CM | POA: Diagnosis not present

## 2019-10-14 DIAGNOSIS — I480 Paroxysmal atrial fibrillation: Secondary | ICD-10-CM | POA: Diagnosis present

## 2019-10-14 DIAGNOSIS — D631 Anemia in chronic kidney disease: Secondary | ICD-10-CM | POA: Diagnosis present

## 2019-10-14 DIAGNOSIS — I5023 Acute on chronic systolic (congestive) heart failure: Secondary | ICD-10-CM | POA: Diagnosis present

## 2019-10-14 DIAGNOSIS — R109 Unspecified abdominal pain: Secondary | ICD-10-CM

## 2019-10-14 DIAGNOSIS — Z932 Ileostomy status: Secondary | ICD-10-CM | POA: Diagnosis not present

## 2019-10-14 DIAGNOSIS — N1832 Chronic kidney disease, stage 3b: Secondary | ICD-10-CM | POA: Diagnosis present

## 2019-10-14 DIAGNOSIS — B948 Sequelae of other specified infectious and parasitic diseases: Secondary | ICD-10-CM | POA: Diagnosis not present

## 2019-10-14 DIAGNOSIS — I248 Other forms of acute ischemic heart disease: Secondary | ICD-10-CM | POA: Diagnosis present

## 2019-10-14 DIAGNOSIS — R531 Weakness: Secondary | ICD-10-CM

## 2019-10-14 HISTORY — DX: Personal history of COVID-19: Z86.16

## 2019-10-14 LAB — BASIC METABOLIC PANEL
Anion gap: 12 (ref 5–15)
Anion gap: 11 (ref 5–15)
BUN: 95 mg/dL — ABNORMAL HIGH (ref 8–23)
BUN: 98 mg/dL — ABNORMAL HIGH (ref 8–23)
CO2: 12 mmol/L — ABNORMAL LOW (ref 22–32)
CO2: 12 mmol/L — ABNORMAL LOW (ref 22–32)
Calcium: 10.9 mg/dL — ABNORMAL HIGH (ref 8.9–10.3)
Calcium: 10.9 mg/dL — ABNORMAL HIGH (ref 8.9–10.3)
Chloride: 104 mmol/L (ref 98–111)
Chloride: 107 mmol/L (ref 98–111)
Creatinine, Ser: 4.71 mg/dL — ABNORMAL HIGH (ref 0.44–1.00)
Creatinine, Ser: 4.6 mg/dL — ABNORMAL HIGH (ref 0.44–1.00)
GFR calc Af Amer: 11 mL/min — ABNORMAL LOW (ref >60)
GFR calc Af Amer: 11 mL/min — ABNORMAL LOW (ref >60)
GFR calc non Af Amer: 9 mL/min — ABNORMAL LOW (ref >60)
GFR calc non Af Amer: 9 mL/min — ABNORMAL LOW (ref >60)
Glucose, Bld: 232 mg/dL — ABNORMAL HIGH (ref 70–99)
Glucose, Bld: 96 mg/dL (ref 70–99)
Potassium: 5.8 mmol/L — ABNORMAL HIGH (ref 3.5–5.1)
Potassium: 5.2 mmol/L — ABNORMAL HIGH (ref 3.5–5.1)
Sodium: 128 mmol/L — ABNORMAL LOW (ref 135–145)
Sodium: 130 mmol/L — ABNORMAL LOW (ref 135–145)

## 2019-10-14 LAB — CBC WITH DIFFERENTIAL/PLATELET
Abs Immature Granulocytes: 0.03 10*3/uL (ref 0.00–0.07)
Basophils Absolute: 0 10*3/uL (ref 0.0–0.1)
Basophils Relative: 0 %
Eosinophils Absolute: 0 10*3/uL (ref 0.0–0.5)
Eosinophils Relative: 0 %
HCT: 29.5 % — ABNORMAL LOW (ref 36.0–46.0)
Hemoglobin: 9.9 g/dL — ABNORMAL LOW (ref 12.0–15.0)
Immature Granulocytes: 0 %
Lymphocytes Relative: 13 %
Lymphs Abs: 0.9 10*3/uL (ref 0.7–4.0)
MCH: 31.1 pg (ref 26.0–34.0)
MCHC: 33.6 g/dL (ref 30.0–36.0)
MCV: 92.8 fL (ref 80.0–100.0)
Monocytes Absolute: 0.5 10*3/uL (ref 0.1–1.0)
Monocytes Relative: 7 %
Neutro Abs: 5.7 10*3/uL (ref 1.7–7.7)
Neutrophils Relative %: 80 %
Platelets: 261 10*3/uL (ref 150–400)
RBC: 3.18 MIL/uL — ABNORMAL LOW (ref 3.87–5.11)
RDW: 13.4 % (ref 11.5–15.5)
WBC: 7.2 10*3/uL (ref 4.0–10.5)
nRBC: 0 % (ref 0.0–0.2)

## 2019-10-14 LAB — COMPREHENSIVE METABOLIC PANEL
ALT: 13 U/L (ref 0–44)
AST: 15 U/L (ref 15–41)
Albumin: 4.2 g/dL (ref 3.5–5.0)
Alkaline Phosphatase: 68 U/L (ref 38–126)
Anion gap: 8 (ref 5–15)
BUN: 96 mg/dL — ABNORMAL HIGH (ref 8–23)
CO2: 12 mmol/L — ABNORMAL LOW (ref 22–32)
Calcium: 10.3 mg/dL (ref 8.9–10.3)
Chloride: 106 mmol/L (ref 98–111)
Creatinine, Ser: 4.83 mg/dL — ABNORMAL HIGH (ref 0.44–1.00)
GFR calc Af Amer: 10 mL/min — ABNORMAL LOW (ref >60)
GFR calc non Af Amer: 9 mL/min — ABNORMAL LOW (ref >60)
Glucose, Bld: 104 mg/dL — ABNORMAL HIGH (ref 70–99)
Potassium: 7.2 mmol/L (ref 3.5–5.1)
Sodium: 126 mmol/L — ABNORMAL LOW (ref 135–145)
Total Bilirubin: 0.8 mg/dL (ref 0.3–1.2)
Total Protein: 8.7 g/dL — ABNORMAL HIGH (ref 6.5–8.1)

## 2019-10-14 LAB — CBC
HCT: 27.6 % — ABNORMAL LOW (ref 36.0–46.0)
Hemoglobin: 8.9 g/dL — ABNORMAL LOW (ref 12.0–15.0)
MCH: 31.6 pg (ref 26.0–34.0)
MCHC: 32.2 g/dL (ref 30.0–36.0)
MCV: 97.9 fL (ref 80.0–100.0)
Platelets: 249 10*3/uL (ref 150–400)
RBC: 2.82 MIL/uL — ABNORMAL LOW (ref 3.87–5.11)
RDW: 13.3 % (ref 11.5–15.5)
WBC: 6.5 10*3/uL (ref 4.0–10.5)
nRBC: 0 % (ref 0.0–0.2)

## 2019-10-14 LAB — OSMOLALITY: Osmolality: 314 mOsm/kg — ABNORMAL HIGH (ref 275–295)

## 2019-10-14 LAB — GLUCOSE, CAPILLARY: Glucose-Capillary: 125 mg/dL — ABNORMAL HIGH (ref 70–99)

## 2019-10-14 LAB — TROPONIN I (HIGH SENSITIVITY): Troponin I (High Sensitivity): 22 ng/L — ABNORMAL HIGH (ref <18)

## 2019-10-14 LAB — SARS CORONAVIRUS 2 (TAT 6-24 HRS): SARS Coronavirus 2: POSITIVE — AB

## 2019-10-14 MED ORDER — DIGOXIN 0.25 MG/ML IJ SOLN
0.1250 mg | Freq: Once | INTRAMUSCULAR | Status: AC
  Administered 2019-10-14: 18:00:00 0.125 mg via INTRAVENOUS
  Filled 2019-10-14: qty 2

## 2019-10-14 MED ORDER — PATIROMER SORBITEX CALCIUM 8.4 G PO PACK
8.4000 g | PACK | Freq: Every day | ORAL | Status: AC
  Administered 2019-10-14: 8.4 g via ORAL
  Filled 2019-10-14: qty 1

## 2019-10-14 MED ORDER — DEXTROSE 50 % IV SOLN
25.0000 g | Freq: Once | INTRAVENOUS | Status: AC
  Administered 2019-10-14: 25 g via INTRAVENOUS
  Filled 2019-10-14: qty 50

## 2019-10-14 MED ORDER — HEPARIN SODIUM (PORCINE) 5000 UNIT/ML IJ SOLN
5000.0000 [IU] | Freq: Three times a day (TID) | INTRAMUSCULAR | Status: DC
  Administered 2019-10-14 – 2019-10-20 (×19): 5000 [IU] via SUBCUTANEOUS
  Filled 2019-10-14 (×20): qty 1

## 2019-10-14 MED ORDER — SODIUM CHLORIDE 0.9 % IV SOLN: INTRAVENOUS | Status: DC

## 2019-10-14 MED ORDER — CITALOPRAM HYDROBROMIDE 20 MG PO TABS
40.0000 mg | ORAL_TABLET | Freq: Every day | ORAL | Status: DC
  Administered 2019-10-14 – 2019-10-21 (×8): 40 mg via ORAL
  Filled 2019-10-14 (×8): qty 2

## 2019-10-14 MED ORDER — CALCIUM GLUCONATE 10 % IV SOLN
1.0000 g | Freq: Once | INTRAVENOUS | Status: AC
  Administered 2019-10-14: 02:00:00 1 g via INTRAVENOUS
  Filled 2019-10-14: qty 10

## 2019-10-14 MED ORDER — ONDANSETRON HCL 4 MG/2ML IJ SOLN: 4.0000 mg | Freq: Four times a day (QID) | INTRAMUSCULAR | Status: DC | PRN

## 2019-10-14 MED ORDER — CARVEDILOL 3.125 MG PO TABS
3.1250 mg | ORAL_TABLET | Freq: Two times a day (BID) | ORAL | Status: DC
  Administered 2019-10-14 – 2019-10-21 (×13): 3.125 mg via ORAL
  Filled 2019-10-14 (×13): qty 1

## 2019-10-14 MED ORDER — ACETAMINOPHEN 325 MG PO TABS
650.0000 mg | ORAL_TABLET | Freq: Four times a day (QID) | ORAL | Status: DC | PRN
  Administered 2019-10-15 – 2019-10-19 (×11): 650 mg via ORAL
  Filled 2019-10-14 (×11): qty 2

## 2019-10-14 MED ORDER — INSULIN ASPART 100 UNIT/ML ~~LOC~~ SOLN
5.0000 [IU] | Freq: Once | SUBCUTANEOUS | Status: AC
  Administered 2019-10-14: 5 [IU] via INTRAVENOUS
  Filled 2019-10-14: qty 1

## 2019-10-14 MED ORDER — SENNOSIDES-DOCUSATE SODIUM 8.6-50 MG PO TABS: 1.0000 | ORAL_TABLET | Freq: Every evening | ORAL | Status: DC | PRN

## 2019-10-14 MED ORDER — SODIUM BICARBONATE 8.4 % IV SOLN
50.0000 meq | Freq: Once | INTRAVENOUS | Status: AC
  Administered 2019-10-14: 50 meq via INTRAVENOUS
  Filled 2019-10-14: qty 50

## 2019-10-14 MED ORDER — PATIROMER SORBITEX CALCIUM 8.4 G PO PACK
8.4000 g | PACK | Freq: Every day | ORAL | Status: DC
  Administered 2019-10-14 – 2019-10-18 (×5): 8.4 g via ORAL
  Filled 2019-10-14 (×5): qty 1

## 2019-10-14 MED ORDER — METOPROLOL TARTRATE 5 MG/5ML IV SOLN
5.0000 mg | Freq: Three times a day (TID) | INTRAVENOUS | Status: DC | PRN
  Filled 2019-10-14: qty 5

## 2019-10-14 MED ORDER — DIGOXIN 0.25 MG/ML IJ SOLN: 0.2500 mg | Freq: Once | INTRAMUSCULAR | Status: DC

## 2019-10-14 MED ORDER — ACETAMINOPHEN 650 MG RE SUPP: 650.0000 mg | Freq: Four times a day (QID) | RECTAL | Status: DC | PRN

## 2019-10-14 MED ORDER — ONDANSETRON HCL 4 MG PO TABS: 4.0000 mg | ORAL_TABLET | Freq: Four times a day (QID) | ORAL | Status: DC | PRN

## 2019-10-14 MED ORDER — SODIUM CHLORIDE 0.45 % IV SOLN: INTRAVENOUS | Status: DC

## 2019-10-14 MED ORDER — PANTOPRAZOLE SODIUM 40 MG PO TBEC
40.0000 mg | DELAYED_RELEASE_TABLET | Freq: Two times a day (BID) | ORAL | Status: DC
  Administered 2019-10-14 – 2019-10-21 (×14): 40 mg via ORAL
  Filled 2019-10-14 (×14): qty 1

## 2019-10-14 NOTE — H&P (Signed)
History and Physical    Danielle [name] DOB: Jan 02, 1956 DOA: 10/14/2019  PCP: [facility]  Patient coming from: home  I have personally briefly reviewed patient's old medical records in [place]  Chief Complaint: weakness  HPI: Danielle [name] is a 63 y.o. female with medical history significant for  Breast, lung and esophagus, history of systolic heart failure, COPD, hypertension, CKD 4, recently hospitalized for COVID-19 pneumonia with respiratory failure on 09/27/19, who presents to the emergency room with a several day history of persistent dizziness as well as generalized abdominal and back pain.  She denies chest pain, nausea vomiting or change in bowel habits.  Denies cough and shortness of breath   ED Course: On arrival in the emergency room, she was afebrile with temperature of 98.3 blood pressure is 130/86 heart rate 80 respirations 18 with O2 sat of 99% on room air.  Her blood work was significant for sodium of 126 and potassium of 7.2.  Fattening was elevated at 4.83 above her baseline of 2.2.  hemoglobin 9.9.  White cell count 7.2.  Troponin was 22.  KG showed normal sinus rhythm.  Received calcium gluconate, insulin with D50, sodium bicarb as well as Veltassa in the emergency room and hospitalist service was consulted for admission.  Review of Systems: As per HPI otherwise 10 point review of systems negative.    Past Medical History:  Diagnosis Date  . Anxiety   . Arthritis   . Breast cancer ([place]) 2000  . Cancer ([place]) left    breast cancer 2000, chemo tx's with total mastectomy and lymph nodes resected.   . Cancer of right lung ([place]) 02/21/2016   rad tx's.   . CHF (congestive heart failure) ([place])   . COPD (chronic obstructive pulmonary disease) ([place])   . Dependence on supplemental oxygen   . Depression   . Heart murmur   . Hypertension   . Lung nodule   . Lymphedema   . Personal history of chemotherapy   . Personal history of  radiation therapy   . Shortness of breath dyspnea    with exertion  . Status post chemotherapy 2001   left breast cancer  . Status post radiation therapy 2001   left breast cancer    Past Surgical History:  Procedure Laterality Date  . Breast Biospy Left    [facility]  . BREAST SURGERY    . COLONOSCOPY N/A 04/30/2018   Procedure: COLONOSCOPY;  Surgeon: Virgel Manifold, MD;  Location: [facility] ENDOSCOPY;  Service: Endoscopy;  Laterality: N/A;  . COLONOSCOPY N/A 07/22/2018   Procedure: COLONOSCOPY;  Surgeon: Virgel Manifold, MD;  Location: [facility] ENDOSCOPY;  Service: Endoscopy;  Laterality: N/A;  . DILATION AND CURETTAGE OF UTERUS    . ELECTROMAGNETIC NAVIGATION BROCHOSCOPY Right 04/11/2016   Procedure: ELECTROMAGNETIC NAVIGATION BRONCHOSCOPY;  Surgeon: Vilinda Boehringer, MD;  Location: [facility] ORS;  Service: Cardiopulmonary;  Laterality: Right;  . ESOPHAGOGASTRODUODENOSCOPY N/A 07/22/2018   Procedure: ESOPHAGOGASTRODUODENOSCOPY (EGD);  Surgeon: Virgel Manifold, MD;  Location: [facility] ENDOSCOPY;  Service: Endoscopy;  Laterality: N/A;  . ESOPHAGOGASTRODUODENOSCOPY (EGD) WITH PROPOFOL N/A 05/07/2018   Procedure: ESOPHAGOGASTRODUODENOSCOPY (EGD) WITH PROPOFOL;  Surgeon: Lucilla Lame, MD;  Location: [facility] ENDOSCOPY;  Service: Endoscopy;  Laterality: N/A;  . ESOPHAGOGASTRODUODENOSCOPY (EGD) WITH PROPOFOL N/A 04/24/2019   Procedure: ESOPHAGOGASTRODUODENOSCOPY (EGD) WITH PROPOFOL;  Surgeon: Jonathon Bellows, MD;  Location: [facility] ENDOSCOPY;  Service: Gastroenterology;  Laterality: N/A;  . EUS N/A 05/07/2019   Procedure: FULL UPPER ENDOSCOPIC  ULTRASOUND (EUS) RADIAL;  Surgeon: Jola Schmidt, MD;  Location: [facility] ENDOSCOPY;  Service: Endoscopy;  Laterality: N/A;  . history of colonoscopy]    . ILEOSCOPY N/A 07/22/2018   Procedure: ILEOSCOPY THROUGH STOMA;  Surgeon: Virgel Manifold, MD;  Location: [facility] ENDOSCOPY;  Service: Endoscopy;  Laterality: N/A;  . ILEOSTOMY    . ILEOSTOMY N/A 09/08/2018   Procedure:  ILEOSTOMY REVISION POSSIBLE CREATION;  Surgeon: Herbert Pun, MD;  Location: [facility] ORS;  Service: General;  Laterality: N/A;  . ILEOSTOMY CLOSURE N/A 08/15/2018   Procedure: DILATION OF ILEOSTOMY STRICTURE;  Surgeon: Herbert Pun, MD;  Location: [facility] ORS;  Service: General;  Laterality: N/A;  . LAPAROTOMY Right 05/04/2018   Procedure: EXPLORATORY LAPAROTOMY right colectomy right and left ostomy;  Surgeon: Herbert Pun, MD;  Location: [facility] ORS;  Service: General;  Laterality: Right;  . LUNG BIOPSY    . MASTECTOMY Left    2000, [facility]  . ROTATOR CUFF REPAIR Right    [facility]     reports that she quit smoking about 7 years ago. Her smoking use included cigarettes. She has a 10.00 pack-year smoking history. Her smokeless tobacco use includes snuff. She reports previous alcohol use. She reports that she does not use drugs.  No Known Allergies  Family History  Problem Relation Age of Onset  . Breast cancer Mother 60  . Cancer Mother        Breast   . Cirrhosis Father   . Breast cancer Paternal Aunt 73  . Cancer Maternal Aunt        Breast      Prior to Admission medications   Medication Sig Start Date End Date Taking? Authorizing Provider  acetaminophen (TYLENOL) 325 MG tablet Take 2 tablets (650 mg total) by mouth every 6 (six) hours as needed for mild pain (or Fever >/= 101). 12/24/18   Gouru, Illene Silver, MD  albuterol (PROVENTIL HFA;VENTOLIN HFA) 108 (90 Base) MCG/ACT inhaler Inhale 2 puffs into the lungs every 6 (six) hours as needed for wheezing or shortness of breath. 04/24/18   Demetrios Loll, MD  calcium-vitamin D (OSCAL WITH D) 250-125 MG-UNIT tablet Take 1 tablet by mouth daily.    [provider]  carvedilol (COREG) 3.125 MG tablet Take 3.125 mg by mouth 2 (two) times daily with a meal.    [provider]  citalopram (CELEXA) 20 MG tablet Take 40 mg by mouth daily.     [provider]  ferrous sulfate 325 (65 FE) MG tablet Take 1 tablet (325  mg total) by mouth 2 (two) times daily with a meal. 07/02/18   [name], MD  Fluticasone-Umeclidin-Vilant 100-62.5-25 MCG/INH AEPB Inhale 1 puff into the lungs daily.  01/07/19   [provider]  furosemide (LASIX) 20 MG tablet Take 1 tablet (20 mg total) by mouth daily. And additional 20mg  dose as needed Patient taking differently: Take 20-40 mg by mouth See admin instructions. Take 1 tablet (20mg ) by mouth daily - may take a second tablet (20mg ) by mouth daily as needed for severe swelling 06/15/19   Darylene Price A, FNP  guaiFENesin-dextromethorphan (ROBITUSSIN DM) 100-10 MG/5ML syrup Take 5 mLs by mouth every 4 (four) hours as needed for cough.    [provider]  loperamide (IMODIUM A-D) 2 MG tablet Take 1 tablet (2 mg total) by mouth as needed for diarrhea or loose stools. 10/04/18   Herbert Pun, MD  megestrol (MEGACE ES) 625 MG/5ML suspension Take 5 mLs (625 mg total) by mouth  daily. 09/21/19   [name], MD  Multiple Vitamin (MULTIVITAMIN WITH MINERALS) TABS tablet Take 1 tablet by mouth daily. 06/06/18   Fritzi Mandes, MD  pantoprazole (PROTONIX) 40 MG tablet Take 1 tablet (40 mg total) by mouth 2 (two) times daily before a meal. 07/23/18   Sudini, Alveta Heimlich, MD  potassium chloride 20 MEQ TBCR Take 10 mEq by mouth daily. 10/02/19   Danford, Suann Larry, MD  sucralfate (CARAFATE) 1 GM/10ML suspension Take 10 mLs (1 g total) by mouth 4 (four) times daily. 08/18/19   Noreene Filbert, MD    Physical Exam: Vitals:   10/13/19 2328 10/13/19 2343 10/14/19 0200 10/14/19 0215  BP:  96/67 125/73   Pulse:  80 95 95  Resp:  18  (!) 26  Temp:  98.7 F (37.1 C)    TempSrc:  Oral    SpO2:  100% 99% 99%  Weight: 55.8 kg     Height: 5\' 5"  (1.651 m)        Vitals:   10/13/19 2328 10/13/19 2343 10/14/19 0200 10/14/19 0215  BP:  96/67 125/73   Pulse:  80 95 95  Resp:  18  (!) 26  Temp:  98.7 F (37.1 C)    TempSrc:  Oral    SpO2:  100% 99% 99%    Weight: 55.8 kg     Height: 5\' 5"  (1.651 m)       Constitutional: NAD, alert and oriented x 3 Eyes: PERRL, lids and conjunctivae normal ENMT: Mucous membranes are moist.  Neck: normal, supple, no masses, no thyromegaly Respiratory: clear to auscultation bilaterally, no wheezing, no crackles. Normal respiratory effort. No accessory muscle use.  Cardiovascular: Regular rate and rhythm, no murmurs / rubs / gallops. No extremity edema. 2+ pedal pulses. No carotid bruits.  Abdomen: no tenderness, no masses palpated. No hepatosplenomegaly. Bowel sounds positive.  Musculoskeletal: no clubbing / cyanosis. No joint deformity upper and lower extremities.  Skin: no rashes, lesions, ulcers.  Neurologic: No gross focal neurologic deficit. Psychiatric: Normal mood and affect.   Labs on Admission: I have personally reviewed following labs and imaging studies  CBC: Recent Labs  Lab 10/13/19 2345  WBC 7.2  NEUTROABS 5.7  HGB 9.9*  HCT 29.5*  MCV 92.8  PLT 811   Basic Metabolic Panel: Recent Labs  Lab 10/13/19 2345  NA 126*  K 7.2*  CL 106  CO2 12*  GLUCOSE 104*  BUN 96*  CREATININE 4.83*  CALCIUM 10.3   GFR: Estimated Creatinine Clearance: 10.2 mL/min (A) (by C-G formula based on SCr of 4.83 mg/dL (H)). Liver Function Tests: Recent Labs  Lab 10/13/19 2345  AST 15  ALT 13  ALKPHOS 68  BILITOT 0.8  PROT 8.7*  ALBUMIN 4.2   No results for input(s): LIPASE, AMYLASE in the last 168 hours. No results for input(s): AMMONIA in the last 168 hours. Coagulation Profile: No results for input(s): INR, PROTIME in the last 168 hours. Cardiac Enzymes: No results for input(s): CKTOTAL, CKMB, CKMBINDEX, TROPONINI in the last 168 hours. BNP (last 3 results) No results for input(s): PROBNP in the last 8760 hours. HbA1C: No results for input(s): HGBA1C in the last 72 hours. CBG: No results for input(s): GLUCAP in the last 168 hours. Lipid Profile: No results for input(s): CHOL, HDL,  LDLCALC, TRIG, CHOLHDL, LDLDIRECT in the last 72 hours. Thyroid Function Tests: No results for input(s): TSH, T4TOTAL, FREET4, T3FREE, THYROIDAB in the last 72 hours. Anemia Panel: No results for  input(s): VITAMINB12, FOLATE, FERRITIN, TIBC, IRON, RETICCTPCT in the last 72 hours. Urine analysis:    Component Value Date/Time   COLORURINE YELLOW (A) 09/28/2019 0225   APPEARANCEUR HAZY (A) 09/28/2019 0225   APPEARANCEUR Clear 12/06/2013 2245   LABSPEC 1.018 09/28/2019 0225   LABSPEC 1.015 12/06/2013 2245   PHURINE 5.0 09/28/2019 0225   GLUCOSEU NEGATIVE 09/28/2019 0225   GLUCOSEU Negative 12/06/2013 2245   HGBUR NEGATIVE 09/28/2019 0225   BILIRUBINUR NEGATIVE 09/28/2019 0225   BILIRUBINUR Negative 12/06/2013 2245   KETONESUR NEGATIVE 09/28/2019 0225   PROTEINUR 30 (A) 09/28/2019 0225   NITRITE NEGATIVE 09/28/2019 0225   LEUKOCYTESUR NEGATIVE 09/28/2019 0225   LEUKOCYTESUR Negative 12/06/2013 2245    Radiological Exams on Admission: No results found.  EKG: Independently reviewed.   Assessment/Plan Principal Problem:   Hyperkalemia  Secondary to acute kidney injury And received Veltassa as well as calcium gluconate, insulin and dextrose and sodium bicarb in the emergency room Monitor potassium every 4 to 6 hours Discontinue home med potassium Consider daily Veltassa if still elevated  Acute kidney injury superimposed on CKD 4 Etiology possibly related to dehydration in the setting of recent hospitalization for COVID-19 pneumonia, as well as diuretic therapy IV hydration and monitor renal function For fluid overload in view of history of systolic heart failure Nephrology consult placed to Dr. Candiss Norse  Hyponatremia IV hydration with normal saline Serum and urine osmolality Monitor sodium    History of 2019 novel coronavirus disease (COVID-19) Symptomatic c for residual weakness    Chronic systolic heart failure (HCC) Euvolemic Continue carvedilol.  Currently on ACE  inhibitor likely related to kidney function. Hold furosemide for acute kidney injury Monitor for overload in view of IV hydration with normal saline    HTN (hypertension) Continue home meds of carvedilol    Moderate COPD (chronic obstructive pulmonary disease) (HCC Acutely exacerbated Continue Proventil)       DVT prophylaxis: lovenox  Code Status: full  Family Communication: none  Disposition Plan: Back to previous home environment Consults called: renal, Dr Candiss Norse     Athena Masse MD [facility]     10/14/2019, 2:58 AM

## 2019-10-14 NOTE — ED Notes (Signed)
Dr. Candiss Norse at bedside at this time to assess patient.

## 2019-10-14 NOTE — ED Notes (Signed)
This RN introduced self to pt. Pt resting and in NAD. Pt's lights dimmed to promote rest. Pt denies any further needs at this time.

## 2019-10-14 NOTE — Consult Note (Signed)
[address] [place], [place] [address] Phone [phone]. Fax [phone]  Date: 10/14/2019                  Patient Name:  Danielle [name]  MRN: [id number]  DOB: 1956/05/31  Age / Sex: 63 y.o., female         PCP: [facility] [employer]                 Service Requesting Consult: IM/ Wyvonnia Dusky, MD                 Reason for Consult: ARF, hyperkalemia            History of Present Illness: Patient is a 63 y.o. female with medical problems of COPD, congestive heart failure, lymphedema, history of breast cancer treated with mastectomy, chemo and radiation, history of esophageal cancer, ischemic colitis with history of hemicolectomy and ileostomy, hypertension and diabetes who was admitted to [facility] on 10/14/2019 for evaluation of hypokalemia, dizziness, generalized abdominal and back pain.  Patient was noted to have decreased urine output Baseline creatinine of 2.12/GFR 28 from October 02, 2019 Presenting creatinine 4.83, potassium of 7.2 With IV hydration and shifting potassium has improved to 5.2 and creatinine 4.60 Calcium is also elevated at 10.9 Is anemic with hemoglobin of 8.9   Medications: Outpatient medications: (Not in a hospital admission)   Current medications: Current Facility-Administered Medications  Medication Dose Route Frequency Provider Last Rate Last Admin  . 0.9 %  sodium chloride infusion   Intravenous Continuous Athena Masse, MD 75 mL/hr at 10/14/19 1232 Restarted at 10/14/19 1232  . acetaminophen (TYLENOL) tablet 650 mg  650 mg Oral Q6H PRN Athena Masse, MD       Or  . acetaminophen (TYLENOL) suppository 650 mg  650 mg Rectal Q6H PRN Athena Masse, MD      . heparin injection 5,000 Units  5,000 Units Subcutaneous Q8H Athena Masse, MD   5,000 Units at 10/14/19 1321  . ondansetron (ZOFRAN) tablet 4 mg  4 mg Oral Q6H PRN Athena Masse, MD       Or  . ondansetron [facility]) injection 4 mg  4 mg Intravenous Q6H  PRN Athena Masse, MD      . patiromer [facility]) packet 8.4 g  8.4 g Oral Daily Gregor Hams, MD   8.4 g at 10/14/19 0152  . senna-docusate (Senokot-S) tablet 1 tablet  1 tablet Oral QHS PRN Athena Masse, MD       Current Outpatient Medications  Medication Sig Dispense Refill  . acetaminophen (TYLENOL) 325 MG tablet Take 2 tablets (650 mg total) by mouth every 6 (six) hours as needed for mild pain (or Fever >/= 101).    [name] albuterol (PROVENTIL HFA;VENTOLIN HFA) 108 (90 Base) MCG/ACT inhaler Inhale 2 puffs into the lungs every 6 (six) hours as needed for wheezing or shortness of breath. 1 Inhaler 2  . calcium-vitamin D (OSCAL WITH D) 250-125 MG-UNIT tablet Take 1 tablet by mouth daily.    . carvedilol (COREG) 3.125 MG tablet Take 3.125 mg by mouth 2 (two) times daily with a meal.    . citalopram (CELEXA) 20 MG tablet Take 40 mg by mouth daily.     . ferrous sulfate 325 (65 FE) MG tablet Take 1 tablet (325 mg total) by mouth 2 (two) times daily with a meal. 60 tablet 3  . Fluticasone-Umeclidin-Vilant 100-62.5-25 MCG/INH AEPB Inhale 1  puff into the lungs daily.     . furosemide (LASIX) 20 MG tablet Take 1 tablet (20 mg total) by mouth daily. And additional 20mg  dose as needed (Patient taking differently: Take 20-40 mg by mouth See admin instructions. Take 1 tablet (20mg ) by mouth daily - may take a second tablet (20mg ) by mouth daily as needed for severe swelling) 40 tablet 5  . guaiFENesin-dextromethorphan (ROBITUSSIN DM) 100-10 MG/5ML syrup Take 5 mLs by mouth every 4 (four) hours as needed for cough.    . loperamide (IMODIUM A-D) 2 MG tablet Take 1 tablet (2 mg total) by mouth as needed for diarrhea or loose stools. 30 tablet 0  . megestrol (MEGACE ES) 625 MG/5ML suspension Take 5 mLs (625 mg total) by mouth daily. 150 mL 0  . Multiple Vitamin (MULTIVITAMIN WITH MINERALS) TABS tablet Take 1 tablet by mouth daily. 30 tablet 1  . pantoprazole (PROTONIX) 40 MG tablet Take 1 tablet (40 mg  total) by mouth 2 (two) times daily before a meal. 60 tablet 0  . potassium chloride 20 MEQ TBCR Take 10 mEq by mouth daily. 30 tablet 0  . sucralfate (CARAFATE) 1 GM/10ML suspension Take 10 mLs (1 g total) by mouth 4 (four) times daily. 1200 mL 2      Allergies: No Known Allergies    Past Medical History: Past Medical History:  Diagnosis Date  . Anxiety   . Arthritis   . Breast cancer ([place]) 2000  . Cancer ([place]) left    breast cancer 2000, chemo tx's with total mastectomy and lymph nodes resected.   . Cancer of right lung ([place]) 02/21/2016   rad tx's.   . CHF (congestive heart failure) ([place])   . COPD (chronic obstructive pulmonary disease) ([place])   . Dependence on supplemental oxygen   . Depression   . Heart murmur   . Hypertension   . Lung nodule   . Lymphedema   . Personal history of chemotherapy   . Personal history of radiation therapy   . Shortness of breath dyspnea    with exertion  . Status post chemotherapy 2001   left breast cancer  . Status post radiation therapy 2001   left breast cancer     Past Surgical History: Past Surgical History:  Procedure Laterality Date  . Breast Biospy Left    [facility]  . BREAST SURGERY    . COLONOSCOPY N/A 04/30/2018   Procedure: COLONOSCOPY;  Surgeon: Virgel Manifold, MD;  Location: [facility] ENDOSCOPY;  Service: Endoscopy;  Laterality: N/A;  . COLONOSCOPY N/A 07/22/2018   Procedure: COLONOSCOPY;  Surgeon: Virgel Manifold, MD;  Location: [facility] ENDOSCOPY;  Service: Endoscopy;  Laterality: N/A;  . DILATION AND CURETTAGE OF UTERUS    . ELECTROMAGNETIC NAVIGATION BROCHOSCOPY Right 04/11/2016   Procedure: ELECTROMAGNETIC NAVIGATION BRONCHOSCOPY;  Surgeon: Vilinda Boehringer, MD;  Location: [facility] ORS;  Service: Cardiopulmonary;  Laterality: Right;  . ESOPHAGOGASTRODUODENOSCOPY N/A 07/22/2018   Procedure: ESOPHAGOGASTRODUODENOSCOPY (EGD);  Surgeon: Virgel Manifold, MD;  Location: [facility] ENDOSCOPY;  Service: Endoscopy;  Laterality: N/A;  .  ESOPHAGOGASTRODUODENOSCOPY (EGD) WITH PROPOFOL N/A 05/07/2018   Procedure: ESOPHAGOGASTRODUODENOSCOPY (EGD) WITH PROPOFOL;  Surgeon: Lucilla Lame, MD;  Location: [facility] ENDOSCOPY;  Service: Endoscopy;  Laterality: N/A;  . ESOPHAGOGASTRODUODENOSCOPY (EGD) WITH PROPOFOL N/A 04/24/2019   Procedure: ESOPHAGOGASTRODUODENOSCOPY (EGD) WITH PROPOFOL;  Surgeon: Jonathon Bellows, MD;  Location: [facility] ENDOSCOPY;  Service: Gastroenterology;  Laterality: N/A;  . EUS N/A 05/07/2019   Procedure: FULL UPPER ENDOSCOPIC ULTRASOUND (EUS) RADIAL;  Surgeon:  Jola Schmidt, MD;  Location: [facility] ENDOSCOPY;  Service: Endoscopy;  Laterality: N/A;  . history of colonoscopy]    . ILEOSCOPY N/A 07/22/2018   Procedure: ILEOSCOPY THROUGH STOMA;  Surgeon: Virgel Manifold, MD;  Location: [facility] ENDOSCOPY;  Service: Endoscopy;  Laterality: N/A;  . ILEOSTOMY    . ILEOSTOMY N/A 09/08/2018   Procedure: ILEOSTOMY REVISION POSSIBLE CREATION;  Surgeon: Herbert Pun, MD;  Location: [facility] ORS;  Service: General;  Laterality: N/A;  . ILEOSTOMY CLOSURE N/A 08/15/2018   Procedure: DILATION OF ILEOSTOMY STRICTURE;  Surgeon: Herbert Pun, MD;  Location: [facility] ORS;  Service: General;  Laterality: N/A;  . LAPAROTOMY Right 05/04/2018   Procedure: EXPLORATORY LAPAROTOMY right colectomy right and left ostomy;  Surgeon: Herbert Pun, MD;  Location: [facility] ORS;  Service: General;  Laterality: Right;  . LUNG BIOPSY    . MASTECTOMY Left    2000, [facility]  . ROTATOR CUFF REPAIR Right    [facility]     Family History: Family History  Problem Relation Age of Onset  . Breast cancer Mother 39  . Cancer Mother        Breast   . Cirrhosis Father   . Breast cancer Paternal Aunt 33  . Cancer Maternal Aunt        Breast      Social History: Social History   Socioeconomic History  . Marital status: Divorced    Spouse name: Not on file  . Number of children: 3  . Years of education: Not on file  . Highest education level: Not on file   Occupational History  . Occupation: Retired   Tobacco Use  . Smoking status: Former Smoker    Packs/day: 0.50    Years: 20.00    Pack years: 10.00    Types: Cigarettes    Quit date: 07/02/2012    Years since quitting: 7.2  . Smokeless tobacco: Current User    Types: Snuff  . Tobacco comment: quit 2014  Substance and Sexual Activity  . Alcohol use: Not Currently    Comment: Occasionally  . Drug use: No  . Sexual activity: Not Currently  Other Topics Concern  . Not on file  Social History Narrative  . Not on file   Social Determinants of Health   Financial Resource Strain:   . Difficulty of Paying Living Expenses: Not on file  Food Insecurity:   . Worried About Charity fundraiser in the Last Year: Not on file  . Ran Out of Food in the Last Year: Not on file  Transportation Needs:   . Lack of Transportation (Medical): Not on file  . Lack of Transportation (Non-Medical): Not on file  Physical Activity:   . Days of Exercise per Week: Not on file  . Minutes of Exercise per Session: Not on file  Stress:   . Feeling of Stress : Not on file  Social Connections:   . Frequency of Communication with Friends and Family: Not on file  . Frequency of Social Gatherings with Friends and Family: Not on file  . Attends Religious Services: Not on file  . Active Member of Clubs or Organizations: Not on file  . Attends Archivist Meetings: Not on file  . Marital Status: Not on file  Intimate Partner Violence:   . Fear of Current or Ex-Partner: Not on file  . Emotionally Abused: Not on file  . Physically Abused: Not on file  . Sexually Abused: Not on file  Review of Systems: Gen: Denies fevers or chills.  Does have generalized malaise HEENT: Denies vision or hearing complaints CV: No chest pain or shortness of breath Resp: Does have cough.  Denies hemoptysis. GI: Reports abdominal pain GU : No dysuria or hematuria, decreased urine output MS: Reports back pain Derm:     Denies complaints Psych: No complaints Heme: No complaints Neuro: No complaints Endocrine.  No complaints  Vital Signs: Blood pressure 93/70, pulse (!) 111, temperature 98.7 F (37.1 C), temperature source Oral, resp. rate 18, height 5\' 5"  (1.651 m), weight 55.8 kg, SpO2 97 %.  No intake or output data in the 24 hours ending 10/14/19 1526  Weight trends: Filed Weights   10/13/19 2328  Weight: 55.8 kg    Physical Exam: General:  Chronically ill-appearing, laying in the bed  HEENT  moist oral mucous membranes, anicteric  Neck:  Supple, no mass  Lungs: clear to auscultation bilaterally  Heart::  No rub or gallop  Abdomen:  Soft, mild tenderness in the lower abdomen, colostomy in place  Extremities:  No edema  Neurologic:  Alert, able to answer questions  Skin:  Warm, dry    Lab results: Basic Metabolic Panel: Recent Labs  Lab 10/13/19 2345 10/14/19 0242 10/14/19 0604  NA 126* 128* 130*  K 7.2* 5.8* 5.2*  CL 106 104 107  CO2 12* 12* 12*  GLUCOSE 104* 232* 96  BUN 96* 95* 98*  CREATININE 4.83* 4.71* 4.60*  CALCIUM 10.3 10.9* 10.9*    Liver Function Tests: Recent Labs  Lab 10/13/19 2345  AST 15  ALT 13  ALKPHOS 68  BILITOT 0.8  PROT 8.7*  ALBUMIN 4.2   No results for input(s): LIPASE, AMYLASE in the last 168 hours. No results for input(s): AMMONIA in the last 168 hours.  CBC: Recent Labs  Lab 10/13/19 2345 10/14/19 0604  WBC 7.2 6.5  NEUTROABS 5.7  --   HGB 9.9* 8.9*  HCT 29.5* 27.6*  MCV 92.8 97.9  PLT 261 249    Cardiac Enzymes: No results for input(s): CKTOTAL, TROPONINI in the last 168 hours.  BNP: Invalid input(s): POCBNP  CBG: No results for input(s): GLUCAP in the last 168 hours.  Microbiology: Recent Results (from the past 720 hour(s))  Blood Culture (routine x 2)     Status: None   Collection Time: 09/27/19  1:41 AM   Specimen: BLOOD  Result Value Ref Range Status   Specimen Description BLOOD RIGHT FOREARM  Final    Special Requests   Final    BOTTLES DRAWN AEROBIC AND ANAEROBIC Blood Culture adequate volume   Culture   Final    NO GROWTH 5 DAYS Performed at [facility], [address] [place], [place] [address]    Report Status 10/02/2019 FINAL  Final  Blood Culture (routine x 2)     Status: None   Collection Time: 09/27/19  1:41 AM   Specimen: BLOOD  Result Value Ref Range Status   Specimen Description BLOOD RIGHT ANTECUBITAL  Final   Special Requests   Final    BOTTLES DRAWN AEROBIC AND ANAEROBIC Blood Culture adequate volume   Culture   Final    NO GROWTH 5 DAYS Performed at [facility], [address], [place], [place] [address]    Report Status 10/02/2019 FINAL  Final  Respiratory Panel by PCR     Status: None   Collection Time: 09/27/19  2:39 AM   Specimen: Nasopharyngeal Swab; Respiratory  Result  Value Ref Range Status   Adenovirus NOT DETECTED NOT DETECTED Final   Coronavirus 229E NOT DETECTED NOT DETECTED Final    Comment: (NOTE) The Coronavirus on the Respiratory Panel, DOES NOT test for the novel  Coronavirus (2019 nCoV)    Coronavirus HKU1 NOT DETECTED NOT DETECTED Final   Coronavirus NL63 NOT DETECTED NOT DETECTED Final   Coronavirus OC43 NOT DETECTED NOT DETECTED Final   Metapneumovirus NOT DETECTED NOT DETECTED Final   Rhinovirus / Enterovirus NOT DETECTED NOT DETECTED Final   Influenza A NOT DETECTED NOT DETECTED Final   Influenza B NOT DETECTED NOT DETECTED Final   Parainfluenza Virus 1 NOT DETECTED NOT DETECTED Final   Parainfluenza Virus 2 NOT DETECTED NOT DETECTED Final   Parainfluenza Virus 3 NOT DETECTED NOT DETECTED Final   Parainfluenza Virus 4 NOT DETECTED NOT DETECTED Final   Respiratory Syncytial Virus NOT DETECTED NOT DETECTED Final   Bordetella pertussis NOT DETECTED NOT DETECTED Final   Chlamydophila pneumoniae NOT DETECTED NOT DETECTED Final   Mycoplasma pneumoniae NOT DETECTED NOT DETECTED Final    Comment: Performed at [facility], [place] [address], [place], [place] [address]     Coagulation Studies: No results for input(s): LABPROT, INR in the last 72 hours.  Urinalysis: No results for input(s): COLORURINE, LABSPEC, PHURINE, GLUCOSEU, HGBUR, BILIRUBINUR, KETONESUR, PROTEINUR, UROBILINOGEN, NITRITE, LEUKOCYTESUR in the last 72 hours.  Invalid input(s): APPERANCEUR      Imaging: No results found.   Assessment & Plan: Pt is a 63 y.o. African-American  female with medical problems of COPD, congestive heart failure, lymphedema, history of breast cancer treated with mastectomy, chemo and radiation, history of esophageal cancer, ischemic colitis with history of hemicolectomy and ileostomy, hypertension and diabetes  , was admitted on 10/14/2019 with acute renal failure, abdominal pain.   #Acute kidney injury -Likely secondary to prerenal state/ATN from hypotension and concurrent illness -Agree with IV hydration and holding diuretic -Renal ultrasound from December 17 did not show any acute abnormality but echogenic kidneys were noted bilaterally suggesting medical renal disease. Electrolytes and Volume status are acceptable No acute indication for Dialysis at present   #Chronic kidney disease stage IV Baseline creatinine 2.1/GFR28 from December 2020 CKD likely secondary to diabetes and hypertension  #Diabetes type 2 with CKD -Obtain hemoglobin A1c  #Severe hyperkalemia -Patient was on potassium supplementation at home.  Agree with discontinuing Kcl -So far treated with Veltassa, insulin, D50, iv calcium gluconate, sodium bicarbonate    LOS: 0 Elianne Gubser 12/30/20203:26 PM    Note: This note was prepared with Dragon dictation. Any transcription errors are unintentional

## 2019-10-14 NOTE — ED Notes (Signed)
Pt transported to the floor by Estill Bamberg, Therapist, sports.

## 2019-10-14 NOTE — ED Notes (Signed)
Called into patient's room for ostomy bag leaking. Patient says she changed the bag this morning, does not appear sealed around the umbilicus. This RN cleaned site, stoma pink. Applied new ostomy seal and bag. Patient also has site with mucus drainage, states the surgeon did this on purpose. This RN applied new gauze and tape, no complications at present. Gown and blankets changed

## 2019-10-14 NOTE — ED Notes (Signed)
This RN to bedside, medication administered as ordered. Pt continues to receive IVF. Pt tolerated medication administration well. Pt resting in bed on L side at this time as position of comfort. Explained to patient meal tray sitting next to her on bedside table. Pt states understanding.

## 2019-10-14 NOTE — ED Notes (Signed)
Lab called to draw AM labs

## 2019-10-14 NOTE — Progress Notes (Addendum)
PROGRESS NOTE    Danielle [name]  WLN:[id number] DOB: March 22, 1956 DOA: 10/14/2019 PCP: [facility]       Assessment & Plan:   Principal Problem:   Hyperkalemia Active Problems:   Moderate COPD (chronic obstructive pulmonary disease) (HCC)   AKI (acute kidney injury) (HCC)   Chronic systolic heart failure (HCC)   HTN (hypertension)   CKD (chronic kidney disease) stage 4, GFR 15-29 ml/min (HCC)   History of 2019 novel coronavirus disease (COVID-19)   Hyperkalemia: secondary to AKI. S/p veltassa, calcium gluconate, insulin and dextrose and sodium bicarb. Will give veltassa again today. Nephro consulted   AKI on CKD 4: etiology possibly related to dehydration in the setting of recent hospitalization for COVID-19 pneumonia, as well as diuretic therapy. Continue on IVFs. Nephro consulted   Likely PAF: w/ RVR currently. Will restart home dose of carvedilol. IV lopressor prn. IV digoxin x 1. 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 No need for a transfusion at this time. Will continue to monitor   Hyponatremia: trending up today. Serum and urine osmolality ordered   History of COVID19: symptomatic for residual weakness. Repeat COVID19 test POSITIVE   Generalized weakness: PT/OT consulted   Chronic systolic heart failure: euvolemic.Continue carvedilol. Continue to hold ACE-I & lasix for AKI.  Monitor for overload in view of IV hydration with normal saline  HTN: continue home dose of carvedilol  Moderate COPD: w/ exacerbation. Continue on proventil     DVT prophylaxis: heparin Code Status: full Family Communication:  Disposition Plan:   Consultants:   nephro   Procedures:    Antimicrobials: n/a   Subjective: Pt c/o body aches  Objective: Vitals:   10/14/19 0444 10/14/19 0500 10/14/19 0530 10/14/19 0643  BP: 102/73 97/71 93/72  98/73  Pulse: (!) 102 (!) 102 98 97  Resp: 17 15 12  (!) 21    Temp:      TempSrc:      SpO2: 98% 97% 96% 97%  Weight:      Height:       No intake or output data in the 24 hours ending 10/14/19 0822 Filed Weights   10/13/19 2328  Weight: 55.8 kg    Examination:  General exam: Appears calm and comfortable  Respiratory system: decreased breath sounds b/l. No rales. Respiratory effort normal. Cardiovascular system: S1 & S2 +.. No  rubs, gallops or clicks. Gastrointestinal system: Abdomen is nondistended, soft and nontender. . Normal bowel sounds heard. Central nervous system: Alert and oriented. Moves all 4 extremities  Psychiatry: Judgement and insight appear normal. Flat mood and affect      Data Reviewed: I have personally reviewed following labs and imaging studies  CBC: Recent Labs  Lab 10/13/19 2345 10/14/19 0604  WBC 7.2 6.5  NEUTROABS 5.7  --   HGB 9.9* 8.9*  HCT 29.5* 27.6*  MCV 92.8 97.9  PLT 261 740   Basic Metabolic Panel: Recent Labs  Lab 10/13/19 2345 10/14/19 0242 10/14/19 0604  NA 126* 128* 130*  K 7.2* 5.8* 5.2*  CL 106 104 107  CO2 12* 12* 12*  GLUCOSE 104* 232* 96  BUN 96* 95* 98*  CREATININE 4.83* 4.71* 4.60*  CALCIUM 10.3 10.9* 10.9*   GFR: Estimated Creatinine Clearance: 10.7 mL/min (A) (by C-G formula based on SCr of 4.6 mg/dL (H)). Liver Function Tests: Recent Labs  Lab 10/13/19 2345  AST 15  ALT 13  ALKPHOS 68  BILITOT 0.8  PROT 8.7*  ALBUMIN 4.2   No results for input(s): LIPASE, AMYLASE in the last 168 hours. No results for input(s): AMMONIA in the last 168 hours. Coagulation Profile: No results for input(s): INR, PROTIME in the last 168 hours. Cardiac Enzymes: No results for input(s): CKTOTAL, CKMB, CKMBINDEX, TROPONINI in the last 168 hours. BNP (last 3 results) No results for input(s): PROBNP in the last 8760 hours. HbA1C: No results for input(s): HGBA1C in the last 72 hours. CBG: No results for input(s): GLUCAP in the last 168 hours. Lipid Profile: No results for  input(s): CHOL, HDL, LDLCALC, TRIG, CHOLHDL, LDLDIRECT in the last 72 hours. Thyroid Function Tests: No results for input(s): TSH, T4TOTAL, FREET4, T3FREE, THYROIDAB in the last 72 hours. Anemia Panel: No results for input(s): VITAMINB12, FOLATE, FERRITIN, TIBC, IRON, RETICCTPCT in the last 72 hours. Sepsis Labs: No results for input(s): PROCALCITON, LATICACIDVEN in the last 168 hours.  No results found for this or any previous visit (from the past 240 hour(s)).       Radiology Studies: No results found.      Scheduled Meds: . heparin  5,000 Units Subcutaneous Q8H  . patiromer  8.4 g Oral Daily   Continuous Infusions: . sodium chloride 75 mL/hr at 10/14/19 0321     LOS: 0 days    Time spent: 31 mins    Wyvonnia Dusky, MD [facility] Pager [phone]  If 7PM-7AM, please contact night-coverage [email or web address] Password [email or web address] 10/14/2019, 8:22 AM

## 2019-10-14 NOTE — ED Notes (Signed)
Patient given another warm blanket, says she's "cold and just wants to sleep". IV fluids started, patient NAD

## 2019-10-14 NOTE — Progress Notes (Addendum)
MD notified that patient went into afib and now heart rate in the 120s-140s sustaining. PO carvedilol already given. MD notified of low blood pressure as well. Awaiting for new orders. Patient denies any chest pain but states she "can feel her racing a little bit".   1816: Update given to daughter Danielle [name] over the phone.

## 2019-10-14 NOTE — ED Notes (Signed)
Patient asleep, no complaints at present. Call bell within reach. NAD, vital signs stable. 

## 2019-10-14 NOTE — ED Provider Notes (Signed)
[facility] Emergency Department Provider Note  ____________________________________________   First MD Initiated Contact with Patient 10/14/19 0102     (approximate)  I have reviewed the triage vital signs and the nursing notes.   HISTORY  Chief Complaint Dizziness    HPI Danielle [name] is a 63 y.o. female with below list of previous medical conditions including recently diagnosed COVID-19 on September 26, 2019 returns to the emergency department secondary to dizziness and profound fatigue.  Patient admits to decreased urinary output.  Patient denies any chest pain no shortness of breath.        Past Medical History:  Diagnosis Date  . Anxiety   . Arthritis   . Breast cancer ([place]) 2000  . Cancer ([place]) left    breast cancer 2000, chemo tx's with total mastectomy and lymph nodes resected.   . Cancer of right lung ([place]) 02/21/2016   rad tx's.   . CHF (congestive heart failure) ([place])   . COPD (chronic obstructive pulmonary disease) ([place])   . Dependence on supplemental oxygen   . Depression   . Heart murmur   . Hypertension   . Lung nodule   . Lymphedema   . Personal history of chemotherapy   . Personal history of radiation therapy   . Shortness of breath dyspnea    with exertion  . Status post chemotherapy 2001   left breast cancer  . Status post radiation therapy 2001   left breast cancer    Patient Active Problem List   Diagnosis Date Noted  . Hyperkalemia 10/14/2019  . History of 2019 novel coronavirus disease (COVID-19) 10/14/2019  . Acute respiratory failure due to COVID-19 ([place]) 09/27/2019  . Respiratory tract infection due to COVID-19 virus 09/27/2019  . Acute blood loss anemia 06/20/2019  . Anemia 06/20/2019  . GI bleeding 06/19/2019  . CKD (chronic kidney disease) stage 4, GFR 15-29 ml/min (HCC) 06/12/2019  . Malignant neoplasm of lower third of esophagus ([place]) 05/01/2019  . Acute on chronic respiratory failure with hypoxia  and hypercapnia ([place]) 12/22/2018  . 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 Reflux esophagitis   . Iron deficiency anemia secondary to blood loss (chronic)   . SBO (small bowel obstruction) ([place]) 06/29/2018  . Chronic systolic heart failure ([place]) 06/09/2018  . HTN (hypertension) 06/09/2018  . Atrial fibrillation ([place]) 06/09/2018  . Lymphedema 06/09/2018  . AKI (acute kidney injury) ([place]) 06/04/2018  . Protein-calorie malnutrition, severe 06/04/2018  . Blood in stool   . Focal (segmental) acute (reversible) ischemia of large intestine ([place])   . Ulceration of intestine   . Diverticulosis of large intestine without diverticulitis   . Abnormal CT scan, colon   . Colitis   . COPD exacerbation ([place])   . Malnutrition of moderate degree 04/23/2018  . Palliative care by specialist   . DNR (do not resuscitate) discussion   . Weakness generalized   . Hyponatremia 02/10/2017  . Syncope 02/09/2017  . Lung nodule, solitary 07/25/2016  . Malignant neoplasm of upper lobe of right lung ([place]) 07/25/2016  . Carcinoma of overlapping sites of left breast in female, estrogen receptor positive ([place]) 06/22/2016  . Multiple lung nodules 06/22/2016  . Lesion of right lung   . Breast cancer in female [facility]) 02/21/2016  . Moderate COPD (chronic obstructive pulmonary disease) ([place]) 08/24/2014    Past Surgical History:  Procedure Laterality Date  . Breast Biospy Left    [facility]  . BREAST SURGERY    .  COLONOSCOPY N/A 04/30/2018   Procedure: COLONOSCOPY;  Surgeon: Virgel Manifold, MD;  Location: [facility] ENDOSCOPY;  Service: Endoscopy;  Laterality: N/A;  . COLONOSCOPY N/A 07/22/2018   Procedure: COLONOSCOPY;  Surgeon: Virgel Manifold, MD;  Location: [facility] ENDOSCOPY;  Service: Endoscopy;  Laterality: N/A;  . DILATION AND CURETTAGE OF UTERUS    . ELECTROMAGNETIC NAVIGATION BROCHOSCOPY Right 04/11/2016   Procedure: ELECTROMAGNETIC NAVIGATION BRONCHOSCOPY;  Surgeon: Vilinda Boehringer, MD;  Location: [facility] ORS;   Service: Cardiopulmonary;  Laterality: Right;  . ESOPHAGOGASTRODUODENOSCOPY N/A 07/22/2018   Procedure: ESOPHAGOGASTRODUODENOSCOPY (EGD);  Surgeon: Virgel Manifold, MD;  Location: [facility] ENDOSCOPY;  Service: Endoscopy;  Laterality: N/A;  . ESOPHAGOGASTRODUODENOSCOPY (EGD) WITH PROPOFOL N/A 05/07/2018   Procedure: ESOPHAGOGASTRODUODENOSCOPY (EGD) WITH PROPOFOL;  Surgeon: Lucilla Lame, MD;  Location: [facility] ENDOSCOPY;  Service: Endoscopy;  Laterality: N/A;  . ESOPHAGOGASTRODUODENOSCOPY (EGD) WITH PROPOFOL N/A 04/24/2019   Procedure: ESOPHAGOGASTRODUODENOSCOPY (EGD) WITH PROPOFOL;  Surgeon: Jonathon Bellows, MD;  Location: [facility] ENDOSCOPY;  Service: Gastroenterology;  Laterality: N/A;  . EUS N/A 05/07/2019   Procedure: FULL UPPER ENDOSCOPIC ULTRASOUND (EUS) RADIAL;  Surgeon: Jola Schmidt, MD;  Location: [facility] ENDOSCOPY;  Service: Endoscopy;  Laterality: N/A;  . history of colonoscopy]    . ILEOSCOPY N/A 07/22/2018   Procedure: ILEOSCOPY THROUGH STOMA;  Surgeon: Virgel Manifold, MD;  Location: [facility] ENDOSCOPY;  Service: Endoscopy;  Laterality: N/A;  . ILEOSTOMY    . ILEOSTOMY N/A 09/08/2018   Procedure: ILEOSTOMY REVISION POSSIBLE CREATION;  Surgeon: Herbert Pun, MD;  Location: [facility] ORS;  Service: General;  Laterality: N/A;  . ILEOSTOMY CLOSURE N/A 08/15/2018   Procedure: DILATION OF ILEOSTOMY STRICTURE;  Surgeon: Herbert Pun, MD;  Location: [facility] ORS;  Service: General;  Laterality: N/A;  . LAPAROTOMY Right 05/04/2018   Procedure: EXPLORATORY LAPAROTOMY right colectomy right and left ostomy;  Surgeon: Herbert Pun, MD;  Location: [facility] ORS;  Service: General;  Laterality: Right;  . LUNG BIOPSY    . MASTECTOMY Left    2000, [facility]  . ROTATOR CUFF REPAIR Right    [facility]    Prior to Admission medications   Medication Sig Start Date End Date Taking? Authorizing Provider  acetaminophen (TYLENOL) 325 MG tablet Take 2 tablets (650 mg total) by mouth every 6 (six) hours as needed for  mild pain (or Fever >/= 101). 12/24/18   Gouru, Illene Silver, MD  albuterol (PROVENTIL HFA;VENTOLIN HFA) 108 (90 Base) MCG/ACT inhaler Inhale 2 puffs into the lungs every 6 (six) hours as needed for wheezing or shortness of breath. 04/24/18   Demetrios Loll, MD  calcium-vitamin D (OSCAL WITH D) 250-125 MG-UNIT tablet Take 1 tablet by mouth daily.    [provider]  carvedilol (COREG) 3.125 MG tablet Take 3.125 mg by mouth 2 (two) times daily with a meal.    [provider]  citalopram (CELEXA) 20 MG tablet Take 40 mg by mouth daily.     [provider]  ferrous sulfate 325 (65 FE) MG tablet Take 1 tablet (325 mg total) by mouth 2 (two) times daily with a meal. 07/02/18   [name], MD  Fluticasone-Umeclidin-Vilant 100-62.5-25 MCG/INH AEPB Inhale 1 puff into the lungs daily.  01/07/19   [provider]  furosemide (LASIX) 20 MG tablet Take 1 tablet (20 mg total) by mouth daily. And additional 20mg  dose as needed Patient taking differently: Take 20-40 mg by mouth See admin instructions. Take 1 tablet (20mg ) by mouth daily - may take a second tablet (20mg ) by mouth daily as needed  for severe swelling 06/15/19   Darylene Price A, FNP  guaiFENesin-dextromethorphan (ROBITUSSIN DM) 100-10 MG/5ML syrup Take 5 mLs by mouth every 4 (four) hours as needed for cough.    [provider]  loperamide (IMODIUM A-D) 2 MG tablet Take 1 tablet (2 mg total) by mouth as needed for diarrhea or loose stools. 10/04/18   Herbert Pun, MD  megestrol (MEGACE ES) 625 MG/5ML suspension Take 5 mLs (625 mg total) by mouth daily. 09/21/19   [name], MD  Multiple Vitamin (MULTIVITAMIN WITH MINERALS) TABS tablet Take 1 tablet by mouth daily. 06/06/18   Fritzi Mandes, MD  pantoprazole (PROTONIX) 40 MG tablet Take 1 tablet (40 mg total) by mouth 2 (two) times daily before a meal. 07/23/18   Sudini, Alveta Heimlich, MD  potassium chloride 20 MEQ TBCR Take 10 mEq by mouth daily. 10/02/19    Danford, Suann Larry, MD  sucralfate (CARAFATE) 1 GM/10ML suspension Take 10 mLs (1 g total) by mouth 4 (four) times daily. 08/18/19   Noreene Filbert, MD    Allergies Patient has no known allergies.  Family History  Problem Relation Age of Onset  . Breast cancer Mother 73  . Cancer Mother        Breast   . Cirrhosis Father   . Breast cancer Paternal Aunt 80  . Cancer Maternal Aunt        Breast     Social History Social History   Tobacco Use  . Smoking status: Former Smoker    Packs/day: 0.50    Years: 20.00    Pack years: 10.00    Types: Cigarettes    Quit date: 07/02/2012    Years since quitting: 7.2  . Smokeless tobacco: Current User    Types: Snuff  . Tobacco comment: quit 2014  Substance Use Topics  . Alcohol use: Not Currently    Comment: Occasionally  . Drug use: No    Review of Systems Constitutional: No fever/chills Eyes: No visual changes. ENT: No sore throat. Cardiovascular: Denies chest pain. Respiratory: Denies shortness of breath. Gastrointestinal: No abdominal pain.  No nausea, no vomiting.  No diarrhea.  No constipation. Genitourinary: Negative for dysuria. Musculoskeletal: Negative for neck pain.  Negative for back pain. Integumentary: Negative for rash. Neurological: Negative for headaches, focal weakness or numbness.   ____________________________________________   PHYSICAL EXAM:  VITAL SIGNS: ED Triage Vitals  Enc Vitals Group     BP 10/13/19 2343 96/67     Pulse Rate 10/13/19 2343 80     Resp 10/13/19 2343 18     Temp 10/13/19 2343 98.7 F (37.1 C)     Temp Source 10/13/19 2343 Oral     SpO2 10/13/19 2343 100 %     Weight 10/13/19 2328 55.8 kg (123 lb)     Height 10/13/19 2328 1.651 m (5\' 5" )     Head Circumference --      Peak Flow --      Pain Score 10/13/19 2328 4     Pain Loc --      Pain Edu? --      Excl. in [place]? --     Constitutional: Alert and oriented.  Eyes: Conjunctivae are normal.  Mouth/Throat: Patient is  wearing a mask. Neck: No stridor.  No meningeal signs.   Cardiovascular: Normal rate, regular rhythm. Good peripheral circulation. Grossly normal heart sounds. Respiratory: Normal respiratory effort.  No retractions. Gastrointestinal: Soft and nontender. No distention.  Musculoskeletal: No lower extremity tenderness nor  edema. No gross deformities of extremities. Neurologic:  Normal speech and language. No gross focal neurologic deficits are appreciated.  Skin:  Skin is warm, dry and intact. Psychiatric: Mood and affect are normal. Speech and behavior are normal.  ____________________________________________   LABS (all labs ordered are listed, but only abnormal results are displayed)  Labs Reviewed  CBC WITH DIFFERENTIAL/PLATELET - Abnormal; Notable for the following components:      Result Value   RBC 3.18 (*)    Hemoglobin 9.9 (*)    HCT 29.5 (*)    All other components within normal limits  COMPREHENSIVE METABOLIC PANEL - Abnormal; Notable for the following components:   Sodium 126 (*)    Potassium 7.2 (*)    CO2 12 (*)    Glucose, Bld 104 (*)    BUN 96 (*)    Creatinine, Ser 4.83 (*)    Total Protein 8.7 (*)    GFR calc non Af Amer 9 (*)    GFR calc Af Amer 10 (*)    All other components within normal limits  BASIC METABOLIC PANEL - Abnormal; Notable for the following components:   Sodium 128 (*)    Potassium 5.8 (*)    CO2 12 (*)    Glucose, Bld 232 (*)    BUN 95 (*)    Creatinine, Ser 4.71 (*)    Calcium 10.9 (*)    GFR calc non Af Amer 9 (*)    GFR calc Af Amer 11 (*)    All other components within normal limits  TROPONIN I (HIGH SENSITIVITY) - Abnormal; Notable for the following components:   Troponin I (High Sensitivity) 22 (*)    All other components within normal limits  URINALYSIS, COMPLETE (UACMP) WITH MICROSCOPIC  CBC  BASIC METABOLIC PANEL   ____________________________________________  EKG  ED ECG REPORT I, Simpson N Leda [place], the attending  physician, personally viewed and interpreted this ECG.   Date: 10/14/2019  EKG Time: 7:52 PM  Rate: 82  Rhythm: Normal sinus rhythm  Axis: Normal  Intervals: Normal  ST&T Change: None    PROCEDURES   Procedure(s) performed (including Critical Care):  .Critical Care Performed by: Gregor Hams, MD Authorized by: Gregor Hams, MD   Critical care provider statement:    Critical care time (minutes):  30   Critical care time was exclusive of:  Separately billable procedures and treating other patients   Critical care was necessary to treat or prevent imminent or life-threatening deterioration of the following conditions:  Metabolic crisis   Critical care was time spent personally by me on the following activities:  Development of treatment plan with patient or surrogate, discussions with consultants, evaluation of patient's response to treatment, examination of patient, obtaining history from patient or surrogate, ordering and performing treatments and interventions, ordering and review of laboratory studies, ordering and review of radiographic studies, pulse oximetry, re-evaluation of patient's condition and review of old charts     ____________________________________________   [place] / MDM / [place] / ED COURSE  As part of my medical decision making, I reviewed the following data within the electronic MEDICAL RECORD NUMBER   63 year old female presented with above-stated history and physical exam with laboratory data consistent with acute on chronic renal failure with current creatinine of 4.83 with a BUN of 96 most recent creatinine on October 02, 2019 was 2.12 with a BUN of 22.  In addition's patient's potassium today 7.2.  Patient given 1 amp  D50 insulin 5 units IV, sodium bicarbonate 1 amp IV, calcium gluconate 1 amp IV, and [place].  Repeat BMP reveal potassium of 5.8 with a BUN of 95 and creatinine of 4.71.  Patient discussed with Dr. Damita Dunnings for  hospital admission for further evaluation and management. ____________________________________________  FINAL CLINICAL IMPRESSION(S) / ED DIAGNOSES  Final diagnoses:  Hyperkalemia  Acute renal failure superimposed on chronic kidney disease, unspecified CKD stage, unspecified acute renal failure type ([place])     MEDICATIONS GIVEN DURING THIS VISIT:  Medications  patiromer Daryll Drown) packet 8.4 g (8.4 g Oral Given 10/14/19 0152)  heparin injection 5,000 Units (has no administration in time range)  acetaminophen (TYLENOL) tablet 650 mg (has no administration in time range)    Or  acetaminophen (TYLENOL) suppository 650 mg (has no administration in time range)  senna-docusate (Senokot-S) tablet 1 tablet (has no administration in time range)  ondansetron (ZOFRAN) tablet 4 mg (has no administration in time range)    Or  ondansetron (ZOFRAN) injection 4 mg (has no administration in time range)  0.9 %  sodium chloride infusion ( Intravenous New Bag/Given 10/14/19 0321)  sodium bicarbonate injection 50 mEq (50 mEq Intravenous Given 10/14/19 0144)  calcium gluconate inj 10% (1 g) URGENT USE ONLY! (1 g Intravenous Given 10/14/19 0140)  insulin aspart (novoLOG) injection 5 Units (5 Units Intravenous Given 10/14/19 0139)  dextrose 50 % solution 25 g (25 g Intravenous Given 10/14/19 0134)     ED Discharge Orders    None      *Please note:  Danielle [name] was evaluated in Emergency Department on 10/14/2019 for the symptoms described in the history of present illness. She was evaluated in the context of the global COVID-19 pandemic, which necessitated consideration that the patient might be at risk for infection with the SARS-CoV-2 virus that causes COVID-19. Institutional protocols and algorithms that pertain to the evaluation of patients at risk for COVID-19 are in a state of rapid change based on information released by regulatory bodies including the CDC and federal and state organizations.  These policies and algorithms were followed during the patient's care in the ED.  Some ED evaluations and interventions may be delayed as a result of limited staffing during the pandemic.*  Note:  This document was prepared using Dragon voice recognition software and may include unintentional dictation errors.   Gregor Hams, MD 10/14/19 [phone]

## 2019-10-15 LAB — BASIC METABOLIC PANEL
Anion gap: 9 (ref 5–15)
BUN: 80 mg/dL — ABNORMAL HIGH (ref 8–23)
CO2: 12 mmol/L — ABNORMAL LOW (ref 22–32)
Calcium: 9.6 mg/dL (ref 8.9–10.3)
Chloride: 115 mmol/L — ABNORMAL HIGH (ref 98–111)
Creatinine, Ser: 3.26 mg/dL — ABNORMAL HIGH (ref 0.44–1.00)
GFR calc Af Amer: 16 mL/min — ABNORMAL LOW (ref >60)
GFR calc non Af Amer: 14 mL/min — ABNORMAL LOW (ref >60)
Glucose, Bld: 79 mg/dL (ref 70–99)
Potassium: 5.1 mmol/L (ref 3.5–5.1)
Sodium: 136 mmol/L (ref 135–145)

## 2019-10-15 LAB — HEMOGLOBIN A1C
Hgb A1c MFr Bld: 4.9 % (ref 4.8–5.6)
Mean Plasma Glucose: 93.93 mg/dL

## 2019-10-15 LAB — CBC
HCT: 22.8 % — ABNORMAL LOW (ref 36.0–46.0)
Hemoglobin: 7.7 g/dL — ABNORMAL LOW (ref 12.0–15.0)
MCH: 30.9 pg (ref 26.0–34.0)
MCHC: 33.8 g/dL (ref 30.0–36.0)
MCV: 91.6 fL (ref 80.0–100.0)
Platelets: 228 10*3/uL (ref 150–400)
RBC: 2.49 MIL/uL — ABNORMAL LOW (ref 3.87–5.11)
RDW: 13.4 % (ref 11.5–15.5)
WBC: 5.9 10*3/uL (ref 4.0–10.5)
nRBC: 0 % (ref 0.0–0.2)

## 2019-10-15 LAB — PHOSPHORUS: Phosphorus: 4.5 mg/dL (ref 2.5–4.6)

## 2019-10-15 MED ORDER — ADULT MULTIVITAMIN W/MINERALS CH
1.0000 | ORAL_TABLET | Freq: Every day | ORAL | Status: DC
  Administered 2019-10-16 – 2019-10-21 (×6): 1 via ORAL
  Filled 2019-10-15 (×6): qty 1

## 2019-10-15 MED ORDER — NEPRO/CARBSTEADY PO LIQD
237.0000 mL | Freq: Three times a day (TID) | ORAL | Status: DC
  Administered 2019-10-15 – 2019-10-21 (×15): 237 mL via ORAL

## 2019-10-15 NOTE — Consult Note (Addendum)
[facility] Nurse ostomy consult note Patient known to our department from a previous admission during which she had her ostomy creation, 12/19. Consulted due to leakage in ED and last night on unit. Because it is an ileostomy, pouch will need to be checked and emptied for patient frequently. Overfilling will contribute to pouch leakages.  Patient is not able to care for stoma at this time. Stoma type/location: RLQ ileostomy Stomal assessment/size: Slightly larger than 1 inch according to Bedside RN, Berenice who recently replaced pouch Peristomal assessment: intact Treatment options for stomal/peristomal skin: N/A Output: thin brown stool Ostomy pouching: 1pc. Soft convex with skin barrier ring. Pouch is Kellie Simmering # [id number], Skin barrier ring is Kellie Simmering # [phone] Education provided: None Enrolled patient in [place] program: Yes, previously.  Also:  Noted today that patient has a PRN order for Senokot-S for constipation. Discussed with Bedside RN, Illene Bolus and she will discuss with Dr. Jimmye Norman to inquire if we can discontinue so it is not given inadvertently. Patient with ileotomy.  [place] nursing team will not follow routinely, but will remain available to this patient, the nursing and medical teams.  Please re-consult if needed for continued leakages, skin breakdown in the peristomal area or inability to obtain supplies above.  Thanks,  Maudie Flakes, MSN, RN, [place], Arther Abbott  Pager# [phone]

## 2019-10-15 NOTE — Evaluation (Signed)
Occupational Therapy Evaluation Patient Details Name: Danielle [name] MRN: [id number] DOB: 01-14-1956 Today's Date: 10/15/2019    History of Present Illness 63 year old female admitted with hyperkalemia, reports of dizziness, back and abdominal pain.[name] Recently discharged Covid +. PMH includes: COPD, AKI, HF, HTN, CKD   Clinical Impression   Pt seen for OT eval this date in setting of acute hospitalization d/t hyperkalemia with AKI on CKD. Pt reports being Indep with self care and no AD use for fxl mobility at baseline. Pt demos need for CGA for standing ADLs to assist with balance and safety, MIN A for sit to stand from EOB CGA for fxl mobility within the room. Pt with decreased fxl activity tolerance. Would benefit from continued OT while in acute setting and f/u with [place] upon d/c to assess for environmental modification needs to reduce risk for falls.     Follow Up Recommendations  Home health OT    Equipment Recommendations  3 in 1 bedside commode    Recommendations for Other Services       Precautions / Restrictions Precautions Precautions: Fall Precaution Comments: mod fall Restrictions Weight Bearing Restrictions: No      Mobility Bed Mobility Overal bed mobility: Independent             General bed mobility comments: no physical assist needed to get in/out of bed  Transfers Overall transfer level: Needs assistance Equipment used: 1 person hand held assist Transfers: Sit to/from Stand Sit to Stand: Min guard              Balance Overall balance assessment: Needs assistance Sitting-balance support: Feet supported Sitting balance-Leahy Scale: Good     Standing balance support: Single extremity supported;During functional activity Standing balance-Leahy Scale: Fair Standing balance comment: light unsteadiness, no gross LOB                           ADL either performed or assessed with clinical judgement   ADL                                         General ADL Comments: Pt sits EOB to don socks with setup. Pt requires MIN A for standing balance with clothing mgt over hips. Grooming, self feeding, and UB dressing/bathing, pt performs seated with setup. Requires MIN A only for balance with standing self care.     Vision Patient Visual Report: No change from baseline       Perception     Praxis      Pertinent Vitals/Pain Pain Assessment: Faces Faces Pain Scale: Hurts little more Pain Location: head Pain Descriptors / Indicators: Aching Pain Intervention(s): Monitored during session;Patient requesting pain meds-RN notified     Hand Dominance     Extremity/Trunk Assessment Upper Extremity Assessment Upper Extremity Assessment: Generalized weakness   Lower Extremity Assessment Lower Extremity Assessment: Generalized weakness   Cervical / Trunk Assessment Cervical / Trunk Assessment: Normal   Communication Communication Communication: No difficulties   Cognition Arousal/Alertness: Awake/alert Behavior During Therapy: WFL for tasks assessed/performed Overall Cognitive Status: Within Functional Limits for tasks assessed                                     General Comments  Exercises Other Exercises Other Exercises: OT facilitates education re: EC techniques. Pt demos understanding   Shoulder Instructions      Home Living Family/patient expects to be discharged to:: Private residence Living Arrangements: Children Available Help at Discharge: Family Type of Home: Mobile home Home Access: Level entry     Home Layout: One level     Bathroom Shower/Tub: Teacher, early years/pre: Standard Bathroom Accessibility: Yes   Home Equipment: Environmental consultant - 2 wheels;Bedside commode          Prior Functioning/Environment Level of Independence: Independent        Comments: Pt does not drive much, but is able to be out, run errands, does not use RW. States she  performs all self care I'ly. States she does have family help PRN if needed.        OT Problem List: Decreased strength;Decreased activity tolerance;Impaired balance (sitting and/or standing)      OT Treatment/Interventions: Self-care/ADL training;Therapeutic exercise;Energy conservation;DME and/or AE instruction;Therapeutic activities;Patient/family education;Balance training    OT Goals(Current goals can be found in the care plan section) Acute Rehab OT Goals Patient Stated Goal: to return home, feel better OT Goal Formulation: With patient Time For Goal Achievement: 10/29/19 Potential to Achieve Goals: Good  OT Frequency: Min 1X/week   Barriers to D/C:            Co-evaluation              AM-PAC OT "6 Clicks" Daily Activity     Outcome Measure Help from another person eating meals?: None Help from another person taking care of personal grooming?: A Little Help from another person toileting, which includes using toliet, bedpan, or urinal?: A Little Help from another person bathing (including washing, rinsing, drying)?: A Little Help from another person to put on and taking off regular upper body clothing?: None Help from another person to put on and taking off regular lower body clothing?: A Little 6 Click Score: 20   End of Session Equipment Utilized During Treatment: Gait belt Nurse Communication: Mobility status;Other (comment)(notfied pt requesting headache medicine and ice water as well as needs new colostomy bag)  Activity Tolerance: Patient tolerated treatment well Patient left: in bed;with call bell/phone within reach;with bed alarm set  OT Visit Diagnosis: Unsteadiness on feet (R26.81);Muscle weakness (generalized) (M62.81)                Time: 8115-7262 OT Time Calculation (min): 23 min Charges:  OT General Charges $OT Visit: 1 Visit OT Evaluation $OT Eval Moderate Complexity: 1 Mod OT Treatments $Self Care/Home Management : 8-22 mins  Gerrianne Scale,  MS, OTR/L ascom [phone] 10/15/19, 3:29 PM

## 2019-10-15 NOTE — Plan of Care (Signed)
  Problem: Education: Goal: Knowledge of General Education information will improve Description: Including pain rating scale, medication(s)/side effects and non-pharmacologic comfort measures Outcome: Progressing   Problem: Clinical Measurements: Goal: Respiratory complications will improve Outcome: Progressing   Problem: Activity: Goal: Risk for activity intolerance will decrease Outcome: Progressing   

## 2019-10-15 NOTE — Evaluation (Signed)
Physical Therapy Evaluation Patient Details Name: Danielle [name] MRN: [id number] DOB: [birth year] Today's Date: 10/15/2019   History of Present Illness  63 year old female admitted with hyperkalemia, reports of dizziness, back and abdominal pain.[name] Recently discharged Covid +. PMH includes: COPD, AKI, HF, HTN, CKD  Clinical Impression  Patient received in bed, reports headache and weakness. Agrees to PT assessment. Patient performed bed mobility without assistance, transfers with supervision and ambulates 30 feet with min guard. Patient is weak and slightly unsteady with mobility. She will continue to benefit from skilled PT while here to improve function and strength.     Follow Up Recommendations Home health PT;Supervision - Intermittent    Equipment Recommendations  None recommended by PT    Recommendations for Other Services       Precautions / Restrictions Precautions Precautions: Fall Precaution Comments: mod fall Restrictions Weight Bearing Restrictions: No      Mobility  Bed Mobility Overal bed mobility: Independent             General bed mobility comments: no physical assist needed to get in/out of bed  Transfers Overall transfer level: Needs assistance Equipment used: None Transfers: Sit to/from Stand Sit to Stand: Supervision            Ambulation/Gait Ambulation/Gait assistance: Min guard Gait Distance (Feet): 30 Feet Assistive device: None   Gait velocity: decreased   General Gait Details: patient with difficulty, requring min guard due to weakness.  Stairs            Wheelchair Mobility    Modified Rankin (Stroke Patients Only)       Balance Overall balance assessment: Needs assistance Sitting-balance support: Feet supported Sitting balance-Leahy Scale: Good     Standing balance support: Single extremity supported;During functional activity Standing balance-Leahy Scale: Fair Standing balance comment: unsteady due to  weakness                             Pertinent Vitals/Pain Pain Assessment: Faces Faces Pain Scale: Hurts little more Pain Location: head Pain Descriptors / Indicators: Aching Pain Intervention(s): Monitored during session    Home Living Family/patient expects to be discharged to:: Private residence Living Arrangements: Children Available Help at Discharge: Family Type of Home: Mobile home Home Access: Level entry     Home Layout: One level Home Equipment: Environmental consultant - 2 wheels;Bedside commode      Prior Function Level of Independence: Independent         Comments: Pt does not drive much, but is able to be out, run errands, does not use RW     Hand Dominance        Extremity/Trunk Assessment   Upper Extremity Assessment Upper Extremity Assessment: Generalized weakness    Lower Extremity Assessment Lower Extremity Assessment: Generalized weakness    Cervical / Trunk Assessment Cervical / Trunk Assessment: Normal  Communication   Communication: No difficulties  Cognition Arousal/Alertness: Awake/alert Behavior During Therapy: WFL for tasks assessed/performed Overall Cognitive Status: Within Functional Limits for tasks assessed                                        General Comments      Exercises     Assessment/Plan    PT Assessment Patient needs continued PT services  PT Problem List Decreased strength;Decreased activity tolerance;Decreased balance;Decreased mobility  PT Treatment Interventions Therapeutic exercise;Gait training;Balance training;Functional mobility training;Therapeutic activities;Patient/family education    PT Goals (Current goals can be found in the Care Plan section)  Acute Rehab PT Goals Patient Stated Goal: to return home, feel better PT Goal Formulation: With patient Time For Goal Achievement: 10/29/19 Potential to Achieve Goals: Good    Frequency Min 2X/week   Barriers to discharge         Co-evaluation               AM-PAC PT "6 Clicks" Mobility  Outcome Measure Help needed turning from your back to your side while in a flat bed without using bedrails?: None Help needed moving from lying on your back to sitting on the side of a flat bed without using bedrails?: None Help needed moving to and from a bed to a chair (including a wheelchair)?: A Little Help needed standing up from a chair using your arms (e.g., wheelchair or bedside chair)?: A Little Help needed to walk in hospital room?: A Little Help needed climbing 3-5 steps with a railing? : A Little 6 Click Score: 20    End of Session Equipment Utilized During Treatment: Gait belt Activity Tolerance: Patient limited by fatigue Patient left: in bed;with bed alarm set;with call bell/phone within reach Nurse Communication: Mobility status PT Visit Diagnosis: Unsteadiness on feet (R26.81);Difficulty in walking, not elsewhere classified (R26.2);Muscle weakness (generalized) (M62.81)    Time: 1010-1035 PT Time Calculation (min) (ACUTE ONLY): 25 min   Charges:   PT Evaluation $PT Eval Moderate Complexity: 1 Mod PT Treatments $Gait Training: 8-22 mins        Carlotta Telfair, PT, GCS 10/15/19,12:25 PM

## 2019-10-15 NOTE — Progress Notes (Signed)
Ostomy pouch supplies ordered and changed twice from leakage. Will continue to monitor and assess.

## 2019-10-15 NOTE — Progress Notes (Addendum)
PROGRESS NOTE    Danielle [name]  XHB:[id number] DOB: [birth year] DOA: 10/14/2019 PCP: [facility]       Assessment & Plan:   Principal Problem:   Hyperkalemia Active Problems:   Moderate COPD (chronic obstructive pulmonary disease) (HCC)   AKI (acute kidney injury) (HCC)   Chronic systolic heart failure (HCC)   HTN (hypertension)   CKD (chronic kidney disease) stage 4, GFR 15-29 ml/min (HCC)   History of 2019 novel coronavirus disease (COVID-19)   Hyperkalemia: secondary to ATN. WNL but high end of normal. S/p veltassa, calcium gluconate, insulin and dextrose and sodium bicarb.  Nephro following and recs apprec  AKI on CKD 4: etiology possibly related to ATN & recent hospitalization for COVID-19 pneumonia, as well as diuretic therapy. Cr continues to trend down. Continue on IVFs. Nephro recs apprec  Likely PAF: w/ RVR. Will continue on carvedilol. IV lopressor prn. S/p  IV digoxin x 1 on 10/14/19. Hold carvedilol/lopressor for MAP <65 and/or HR<65. Unknown why pt is not on anticoagulation, possible fall risk.  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 No need for a transfusion at this time. Will continue to monitor   Hyponatremia: resolved  History of COVID19: symptomatic for residual weakness. Repeat COVID19 test POSITIVE. Pt already completed a course of remdesivir so will not order. Pt is not currently requiring supplemental oxygen   Generalized weakness: PT is rec home health PT. OT consulted   Chronic systolic heart failure: euvolemic.Continue carvedilol. Continue to hold ACE-I & lasix for AKI.  Monitor for overload in view of IV hydration with normal saline  HTN: continue home dose of carvedilol  Moderate COPD: w/ exacerbation. Continue on proventil   Hx of ostomy: ostomy nurse following and recs apprec   DVT prophylaxis: heparin Code Status: full Family Communication:  Disposition Plan:   Consultants:   nephro   Procedures:     Antimicrobials: n/a   Subjective: Pt c/o fatigue  Objective: Vitals:   10/14/19 1749 10/14/19 1831 10/14/19 2021 10/15/19 0756  BP: (!) 81/60 (!) 83/64 92/67 101/70  Pulse: (!) 121 (!) 110 96 91  Resp:   20 19  Temp:   98.4 F (36.9 C) 98.1 F (36.7 C)  TempSrc:    Oral  SpO2:   97% 98%  Weight:      Height:        Intake/Output Summary (Last 24 hours) at 10/15/2019 0809 Last data filed at 10/15/2019 0002 Gross per 24 hour  Intake --  Output 275 ml  Net -275 ml   Filed Weights   10/13/19 2328 10/14/19 1622  Weight: 55.8 kg 51.6 kg    Examination:  General exam: Appears calm and comfortable  Respiratory system: diminished breath sounds b/l. No rales. Respiratory effort normal. Cardiovascular system: S1 & S2 +. No  rubs, gallops or clicks. Gastrointestinal system: Abdomen is nondistended, soft and nontender.  Normal bowel sounds heard. Central nervous system: Alert and oriented. Moves all 4 extremities  Psychiatry: Judgement and insight appear normal. Flat mood and affect      Data Reviewed: I have personally reviewed following labs and imaging studies  CBC: Recent Labs  Lab 10/13/19 2345 10/14/19 0604 10/15/19 0503  WBC 7.2 6.5 5.9  NEUTROABS 5.7  --   --   HGB 9.9* 8.9* 7.7*  HCT 29.5* 27.6* 22.8*  MCV 92.8 97.9 91.6  PLT 261 249 810   Basic Metabolic Panel: Recent Labs  Lab 10/13/19 2345 10/14/19 0242 10/14/19 0604  10/15/19 0503  NA 126* 128* 130* 136  K 7.2* 5.8* 5.2* 5.1  CL 106 104 107 115*  CO2 12* 12* 12* 12*  GLUCOSE 104* 232* 96 79  BUN 96* 95* 98* 80*  CREATININE 4.83* 4.71* 4.60* 3.26*  CALCIUM 10.3 10.9* 10.9* 9.6  PHOS  --   --   --  4.5   GFR: Estimated Creatinine Clearance: 14 mL/min (A) (by C-G formula based on SCr of 3.26 mg/dL (H)). Liver Function Tests: Recent Labs  Lab 10/13/19 2345  AST 15  ALT 13  ALKPHOS 68  BILITOT 0.8  PROT 8.7*  ALBUMIN 4.2   No results for input(s): LIPASE, AMYLASE in the last 168  hours. No results for input(s): AMMONIA in the last 168 hours. Coagulation Profile: No results for input(s): INR, PROTIME in the last 168 hours. Cardiac Enzymes: No results for input(s): CKTOTAL, CKMB, CKMBINDEX, TROPONINI in the last 168 hours. BNP (last 3 results) No results for input(s): PROBNP in the last 8760 hours. HbA1C: No results for input(s): HGBA1C in the last 72 hours. CBG: Recent Labs  Lab 10/14/19 2023  GLUCAP 125*   Lipid Profile: No results for input(s): CHOL, HDL, LDLCALC, TRIG, CHOLHDL, LDLDIRECT in the last 72 hours. Thyroid Function Tests: No results for input(s): TSH, T4TOTAL, FREET4, T3FREE, THYROIDAB in the last 72 hours. Anemia Panel: No results for input(s): VITAMINB12, FOLATE, FERRITIN, TIBC, IRON, RETICCTPCT in the last 72 hours. Sepsis Labs: No results for input(s): PROCALCITON, LATICACIDVEN in the last 168 hours.  Recent Results (from the past 240 hour(s))  SARS CORONAVIRUS 2 (TAT 6-24 HRS) Nasopharyngeal Nasopharyngeal Swab     Status: Abnormal   Collection Time: 10/14/19  9:34 AM   Specimen: Nasopharyngeal Swab  Result Value Ref Range Status   SARS Coronavirus 2 POSITIVE (A) NEGATIVE Final    Comment: RESULT CALLED TO, READ BACK BY AND VERIFIED WITH: B.ALEJA RN 1710 10/14/2019 MCCORMICK K (NOTE) SARS-CoV-2 target nucleic acids are DETECTED. The SARS-CoV-2 RNA is generally detectable in upper and lower respiratory specimens during the acute phase of infection. Positive results are indicative of the presence of SARS-CoV-2 RNA. Clinical correlation with patient history and other diagnostic information is  necessary to determine patient infection status. Positive results do not rule out bacterial infection or co-infection with other viruses.  The expected result is Negative. Fact Sheet for Patients: SugarRoll.be Fact Sheet for Healthcare Providers: https://www.woods-mathews.com/ This test is not yet  approved or cleared by the [place] FDA and  has been authorized for detection and/or diagnosis of SARS-CoV-2 by FDA under an Emergency Use Authorization (EUA). This EUA will remain  in effect (meaning this test can be used) for  the duration of the COVID-19 declaration under Section 564(b)(1) of the Act, 21 U.S.C. section 360bbb-3(b)(1), unless the authorization is terminated or revoked sooner. Performed at [facility], [place] [address] [place], [place] [address]          Radiology Studies: No results found.      Scheduled Meds: . carvedilol  3.125 mg Oral BID WC  . citalopram  40 mg Oral Daily  . heparin  5,000 Units Subcutaneous Q8H  . pantoprazole  40 mg Oral BID  . patiromer  8.4 g Oral Daily   Continuous Infusions: . sodium chloride 75 mL/hr at 10/14/19 1625     LOS: 1 day    Time spent: 30 mins    Wyvonnia Dusky, MD [facility] Pager [phone]  If  7PM-7AM, please contact night-coverage [email or web address] Password [email or web address] 10/15/2019, 8:09 AM

## 2019-10-15 NOTE — Progress Notes (Signed)
Initial Nutrition Assessment  DOCUMENTATION CODES:   Not applicable  INTERVENTION:   Nepro Shake po TID, each supplement provides 425 kcal and 19 grams protein  MVI daily   NUTRITION DIAGNOSIS:   Increased nutrient needs related to catabolic illness(COVID 19) as evidenced by increased estimated needs.  GOAL:   Patient will meet greater than or equal to 90% of their needs  MONITOR:   PO intake, Supplement acceptance, Labs, Weight trends, Skin, I & O's  REASON FOR ASSESSMENT:   Malnutrition Screening Tool    ASSESSMENT:   63 y.o. 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 Pt now admitted with hyperkalemia and COVID 19  Unable to reach pt by phone. Pt is well known to this RD from multiple previous admits. Pt was hospitalized during multiple admissions from July-November of 2019 with radiation pneumonitis and SBO where she required prolonged TPN that was complicated by CHF. Pt developed malnutrition during that admit where she lost down from ~135lbs to ~119lbs. Pt generally has poor appetite and oral intake but will drink some Nepro while in hospital. It appears that patient never regained that weight back but had remained weight stable up until 07/2019. Per chart, pt down 13lbs(10%) over the past 3 months which is significant. Pt documented to be eating 75% of meals during her last admit on 12/18. RD suspects poor appetite and oral intake pta r/t COVID 19. RD will add supplements and MVI to help pt meet her estimated needs. RD will also liberalize diet as pt does not likely eat enough to exceed any  nutrient limits.   Pt at high risk for malnutrition but unable to diagnose at this time as NFPE cannot be performed.   Medications reviewed and include: celexa, heparin, veltassa, protonix, NaCl @75ml /hr  Labs reviewed: K 5.1 wnl, BUN 80(H), creat 3.26(H) Hgb 7.7(L), Hct 22.8(L) AIC 4.9- 12/31  Unable to complete Nutrition-Focused physical exam at this time as pt with COVID 19.   Diet Order:   Diet Order            Diet Heart Room service appropriate? Yes; Fluid consistency: Thin  Diet effective now             EDUCATION NEEDS:   Not appropriate for education at this time  Skin:  Skin Assessment: Reviewed RN Assessment  Last BM:  12/31- Type 6 via ostomy  Height:   Ht Readings from Last 1 Encounters:  10/14/19 5\' 6"  (1.676 m)    Weight:   Wt Readings from Last 1 Encounters:  10/14/19 51.6 kg    Ideal Body Weight:  59 kg  BMI:  Body mass index is 18.35 kg/m.  Estimated Nutritional Needs:   Kcal:  1500-1700kcal/day  Protein:  75-85g/day  Fluid:  >1.4L/day  [name] MS, RD, LDN Pager #- [phone] Office#- [phone] After Hours Pager: [phone]

## 2019-10-16 ENCOUNTER — Inpatient Hospital Stay: Payer: Medicare Other

## 2019-10-16 LAB — BASIC METABOLIC PANEL
Anion gap: 8 (ref 5–15)
BUN: 58 mg/dL — ABNORMAL HIGH (ref 8–23)
CO2: 10 mmol/L — ABNORMAL LOW (ref 22–32)
Calcium: 9.2 mg/dL (ref 8.9–10.3)
Chloride: 120 mmol/L — ABNORMAL HIGH (ref 98–111)
Creatinine, Ser: 2.66 mg/dL — ABNORMAL HIGH (ref 0.44–1.00)
GFR calc Af Amer: 21 mL/min — ABNORMAL LOW (ref >60)
GFR calc non Af Amer: 18 mL/min — ABNORMAL LOW (ref >60)
Glucose, Bld: 105 mg/dL — ABNORMAL HIGH (ref 70–99)
Potassium: 3.9 mmol/L (ref 3.5–5.1)
Sodium: 138 mmol/L (ref 135–145)

## 2019-10-16 LAB — CBC
HCT: 21.6 % — ABNORMAL LOW (ref 36.0–46.0)
Hemoglobin: 7.4 g/dL — ABNORMAL LOW (ref 12.0–15.0)
MCH: 31.5 pg (ref 26.0–34.0)
MCHC: 34.3 g/dL (ref 30.0–36.0)
MCV: 91.9 fL (ref 80.0–100.0)
Platelets: 230 10*3/uL (ref 150–400)
RBC: 2.35 MIL/uL — ABNORMAL LOW (ref 3.87–5.11)
RDW: 13.9 % (ref 11.5–15.5)
WBC: 5.7 10*3/uL (ref 4.0–10.5)
nRBC: 0 % (ref 0.0–0.2)

## 2019-10-16 MED ORDER — LOPERAMIDE HCL 2 MG PO CAPS
2.0000 mg | ORAL_CAPSULE | Freq: Once | ORAL | Status: AC
  Administered 2019-10-16: 15:00:00 2 mg via ORAL
  Filled 2019-10-16: qty 1

## 2019-10-16 NOTE — TOC Initial Note (Signed)
Transition of Care [facility]) - Initial/Assessment Note    Patient Details  Name: Danielle [name] MRN: [id number] Date of Birth: October 28, 1955  [facility]) CM/SW Contact:    [name], RN Phone Number: 10/16/2019, 3:06 PM  Clinical Narrative:                   RNCM consulted for high risk assessment and [place] services at discharge.  Patient lives at home with adult Son.  She has been independent with care.  Her daughter picks up her prescriptions and groceries when needed.  Her daughter transports her to her appointments is made in the morning.  If her appointment is later in the day, she uses a medical transport [name] to take her.  Spoke with patient about home health services for PT/OT and she is agreeable to this.  Current DME in home if needed:  RW, cane, and BSC.    No preference in [facility] agencies, will begin search for services.  TOC will continue to monitor case for further needs.   Expected Discharge Plan: [place] Barriers to Discharge: Continued Medical Work up   Patient Goals and CMS Choice        Expected Discharge Plan and Services Expected Discharge Plan: [place]   Discharge Planning Services: CM Consult   Living arrangements for the past 2 months: Single Family Home                                      Prior Living Arrangements/Services Living arrangements for the past 2 months: Single Family Home Lives with:: Adult Children Patient language and need for interpreter reviewed:: Yes        Need for Family Participation in Patient Care: Yes (Comment) Care giver support system in place?: Yes (comment) Current home services: DME((RW, cane, BSC)) Criminal Activity/Legal Involvement Pertinent to Current Situation/Hospitalization: No - Comment as needed  Activities of Daily Living Home Assistive Devices/Equipment: None ADL Screening (condition at time of admission) Patient's cognitive ability adequate to  safely complete daily activities?: Yes Is the patient deaf or have difficulty hearing?: No Does the patient have difficulty seeing, even when wearing glasses/contacts?: No Does the patient have difficulty concentrating, remembering, or making decisions?: No Patient able to express need for assistance with ADLs?: Yes Does the patient have difficulty dressing or bathing?: Yes Independently performs ADLs?: Yes (appropriate for developmental age) Does the patient have difficulty walking or climbing stairs?: Yes Weakness of Legs: Both Weakness of Arms/Hands: None  Permission Sought/Granted                  Emotional Assessment   Attitude/Demeanor/Rapport: Unable to Assess          Admission diagnosis:  Hyperkalemia [E87.5] Acute renal failure superimposed on chronic kidney disease, unspecified CKD stage, unspecified acute renal failure type ([place]) [N17.9, N18.9] Patient Active Problem List   Diagnosis Date Noted  . Hyperkalemia 10/14/2019  . History of 2019 novel coronavirus disease (COVID-19) 10/14/2019  . Acute respiratory failure due to COVID-19 ([place]) 09/27/2019  . Respiratory tract infection due to COVID-19 virus 09/27/2019  . Acute blood loss anemia 06/20/2019  . Anemia 06/20/2019  . GI bleeding 06/19/2019  . CKD (chronic kidney disease) stage 4, GFR 15-29 ml/min (HCC) 06/12/2019  . Malignant neoplasm of lower third of esophagus ([place]) 05/01/2019  . Acute on  chronic respiratory failure with hypoxia and hypercapnia ([place]) 12/22/2018  . Ileostomy dysfunction ([place]) 09/28/2018  . Reflux esophagitis   . Iron deficiency anemia secondary to blood loss (chronic)   . SBO (small bowel obstruction) ([place]) 06/29/2018  . Chronic systolic heart failure ([place]) 06/09/2018  . HTN (hypertension) 06/09/2018  . Atrial fibrillation ([place]) 06/09/2018  . Lymphedema 06/09/2018  . AKI (acute kidney injury) ([place]) 06/04/2018  . Protein-calorie malnutrition, severe 06/04/2018  . Blood in stool   .  Focal (segmental) acute (reversible) ischemia of large intestine ([place])   . Ulceration of intestine   . Diverticulosis of large intestine without diverticulitis   . Abnormal CT scan, colon   . Colitis   . COPD exacerbation ([place])   . Malnutrition of moderate degree 04/23/2018  . Palliative care by specialist   . DNR (do not resuscitate) discussion   . Weakness generalized   . Hyponatremia 02/10/2017  . Syncope 02/09/2017  . Lung nodule, solitary 07/25/2016  . Malignant neoplasm of upper lobe of right lung ([place]) 07/25/2016  . Carcinoma of overlapping sites of left breast in female, estrogen receptor positive ([place]) 06/22/2016  . Multiple lung nodules 06/22/2016  . Lesion of right lung   . Breast cancer in female [facility]) 02/21/2016  . Moderate COPD (chronic obstructive pulmonary disease) ([place]) 08/24/2014   PCP:  [facility], [place]:   [place], [place], [place] - [address] [place] [address] Phone: [phone] Fax: [place], [place] - [place] [address] [place] [address] Phone: [phone] Fax: [phone]  [place], [place] [address] [place] [address] Phone: [phone] Fax: [phone]     Social Determinants of Health (SDOH) Interventions    Readmission Risk Interventions Readmission Risk Prevention Plan 09/28/2019 06/20/2019 06/05/2018  Transportation Screening Complete Complete Complete  PCP or Specialist Appt within 3-5 Days - - Complete  Home Care Screening - - Complete  HRI or [place] - - Complete  Social Work Consult for [place] Planning/Counseling - - Complete  Palliative Care Screening - - Not Complete  Medication Review Press photographer) Complete Complete Complete  HRI or Home Care Consult Patient refused Complete -  SW Recovery Care/Counseling Consult Complete - -  Palliative  Care Screening Not Applicable Not Applicable -  [place] Not Applicable - -  Some recent data might be hidden

## 2019-10-16 NOTE — Progress Notes (Signed)
PROGRESS NOTE    Danielle [name]  TKW:[id number] DOB: 1956-07-14 DOA: 10/14/2019 PCP: [facility], [facility]       Assessment & Plan:   Principal Problem:   Hyperkalemia Active Problems:   Moderate COPD (chronic obstructive pulmonary disease) (HCC)   AKI (acute kidney injury) (HCC)   Chronic systolic heart failure (HCC)   HTN (hypertension)   CKD (chronic kidney disease) stage 4, GFR 15-29 ml/[name] (HCC)   History of 2019 novel coronavirus disease (COVID-19)   AKI on CKD 4: etiology possibly related to ATN & recent hospitalization for COVID-19 pneumonia, as well as diuretic therapy. Cr continues to trend down. Continue on IVFs. Nephro recs apprec  Hyperkalemia: secondary to ATN. Resolved  Likely PAF: w/ RVR. Will continue on carvedilol. IV lopressor prn. S/p  IV digoxin x 1 on 10/14/19. Hold carvedilol/lopressor for MAP <65 and/or HR<65. Unknown why pt is not on anticoagulation, possible fall risk.  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 H&H are trending down, possibly diluational. No need for a transfusion at this time. Will continue to monitor   Hyponatremia: resolved  History of COVID19: symptomatic for residual weakness. Repeat COVID19 test POSITIVE. Pt already completed a course of remdesivir so will not order. Pt is not currently requiring supplemental oxygen   Generalized weakness: PT/OT rec home health PT/OT. Home health orders placed  Chronic systolic heart failure: euvolemic.Continue carvedilol. Continue to hold ACE-I & lasix for AKI.  Monitor for overload in view of IV hydration with normal saline  HTN: continue home dose of carvedilol  Moderate COPD: w/ exacerbation. Continue on proventil   Hx of ostomy: ostomy nurse following and recs apprec   DVT prophylaxis: heparin Code Status: full Family Communication:  Disposition Plan:   Consultants:   nephro   Procedures:    Antimicrobials: n/a   Subjective: Pt c/o  fatigue  Objective: Vitals:   10/15/19 1733 10/15/19 1933 10/16/19 0414 10/16/19 0824  BP: 96/65 102/72 101/72 (!) 99/54  Pulse: 88 92 95 91  Resp: 18 19 18 19   Temp: 98.6 F (37 C) 98.5 F (36.9 C) 98.5 F (36.9 C) 98.4 F (36.9 C)  TempSrc: Oral Oral Oral Oral  SpO2: 100% 100% 100% 98%  Weight:      Height:        Intake/Output Summary (Last 24 hours) at 10/16/2019 0827 Last data filed at 10/16/2019 0530 Gross per 24 hour  Intake 2253.03 ml  Output 2250 ml  Net 3.03 ml   Filed Weights   10/13/19 2328 10/14/19 1622  Weight: 55.8 kg 51.6 kg    Examination:  General exam: Appears calm and comfortable  Respiratory system: decreased breath sounds b/l. No rales. Respiratory effort normal. Cardiovascular system: S1 & S2 +. No  rubs, gallops or clicks. Gastrointestinal system: Abdomen is nondistended, soft and nontender.  Normal bowel sounds heard. Central nervous system: Alert and oriented. Moves all 4 extremities  Psychiatry: Judgement and insight appear normal. Flat mood and affect      Data Reviewed: I have personally reviewed following labs and imaging studies  CBC: Recent Labs  Lab 10/13/19 2345 10/14/19 0604 10/15/19 0503 10/16/19 0348  WBC 7.2 6.5 5.9 5.7  NEUTROABS 5.7  --   --   --   HGB 9.9* 8.9* 7.7* 7.4*  HCT 29.5* 27.6* 22.8* 21.6*  MCV 92.8 97.9 91.6 91.9  PLT 261 249 228 924   Basic Metabolic Panel: Recent Labs  Lab 10/13/19 2345 10/14/19 0242 10/14/19 0604 10/15/19  0503 10/16/19 0348  NA 126* 128* 130* 136 138  K 7.2* 5.8* 5.2* 5.1 3.9  CL 106 104 107 115* 120*  CO2 12* 12* 12* 12* 10*  GLUCOSE 104* 232* 96 79 105*  BUN 96* 95* 98* 80* 58*  CREATININE 4.83* 4.71* 4.60* 3.26* 2.66*  CALCIUM 10.3 10.9* 10.9* 9.6 9.2  PHOS  --   --   --  4.5  --    GFR: Estimated Creatinine Clearance: 17.2 mL/[name] (A) (by C-G formula based on SCr of 2.66 mg/dL (H)). Liver Function Tests: Recent Labs  Lab 10/13/19 2345  AST 15  ALT 13  ALKPHOS 68   BILITOT 0.8  PROT 8.7*  ALBUMIN 4.2   No results for input(s): LIPASE, AMYLASE in the last 168 hours. No results for input(s): AMMONIA in the last 168 hours. Coagulation Profile: No results for input(s): INR, PROTIME in the last 168 hours. Cardiac Enzymes: No results for input(s): CKTOTAL, CKMB, CKMBINDEX, TROPONINI in the last 168 hours. BNP (last 3 results) No results for input(s): PROBNP in the last 8760 hours. HbA1C: Recent Labs    10/15/19 0503  HGBA1C 4.9   CBG: Recent Labs  Lab 10/14/19 2023  GLUCAP 125*   Lipid Profile: No results for input(s): CHOL, HDL, LDLCALC, TRIG, CHOLHDL, LDLDIRECT in the last 72 hours. Thyroid Function Tests: No results for input(s): TSH, T4TOTAL, FREET4, T3FREE, THYROIDAB in the last 72 hours. Anemia Panel: No results for input(s): VITAMINB12, FOLATE, FERRITIN, TIBC, IRON, RETICCTPCT in the last 72 hours. Sepsis Labs: No results for input(s): PROCALCITON, LATICACIDVEN in the last 168 hours.  Recent Results (from the past 240 hour(s))  SARS CORONAVIRUS 2 (TAT 6-24 HRS) Nasopharyngeal Nasopharyngeal Swab     Status: Abnormal   Collection Time: 10/14/19  9:34 AM   Specimen: Nasopharyngeal Swab  Result Value Ref Range Status   SARS Coronavirus 2 POSITIVE (A) NEGATIVE Final    Comment: RESULT CALLED TO, READ BACK BY AND VERIFIED WITH: B.ALEJA RN 1710 10/14/2019 MCCORMICK K (NOTE) SARS-CoV-2 target nucleic acids are DETECTED. The SARS-CoV-2 RNA is generally detectable in upper and lower respiratory specimens during the acute phase of infection. Positive results are indicative of the presence of SARS-CoV-2 RNA. Clinical correlation with patient history and other diagnostic information is  necessary to determine patient infection status. Positive results do not rule out bacterial infection or co-infection with other viruses.  The expected result is Negative. Fact Sheet for Patients: SugarRoll.be Fact Sheet  for Healthcare Providers: https://www.woods-mathews.com/ This test is not yet approved or cleared by the [place] FDA and  has been authorized for detection and/or diagnosis of SARS-CoV-2 by FDA under an Emergency Use Authorization (EUA). This EUA will remain  in effect (meaning this test can be used) for  the duration of the COVID-19 declaration under Section 564(b)(1) of the Act, 21 U.S.C. section 360bbb-3(b)(1), unless the authorization is terminated or revoked sooner. Performed at [facility], [place] [address] [place], [place] [address]          Radiology Studies: No results found.      Scheduled Meds: . carvedilol  3.125 mg Oral BID WC  . citalopram  40 mg Oral Daily  . feeding supplement (NEPRO CARB STEADY)  237 mL Oral TID BM  . heparin  5,000 Units Subcutaneous Q8H  . multivitamin with minerals  1 tablet Oral Daily  . pantoprazole  40 mg Oral BID  . patiromer  8.4 g Oral Daily   Continuous  Infusions: . sodium chloride 75 mL/hr at 10/15/19 2300     LOS: 2 days    Time spent: 31 mins    Wyvonnia Dusky, MD [facility] Pager [phone]  If 7PM-7AM, please contact night-coverage [email or web address] Password [facility] 10/16/2019, 8:27 AM

## 2019-10-17 LAB — TROPONIN I (HIGH SENSITIVITY)
Troponin I (High Sensitivity): 19 ng/L — ABNORMAL HIGH (ref <18)
Troponin I (High Sensitivity): 20 ng/L — ABNORMAL HIGH (ref <18)

## 2019-10-17 LAB — HEMOGLOBIN AND HEMATOCRIT, BLOOD
HCT: 26.5 % — ABNORMAL LOW (ref 36.0–46.0)
Hemoglobin: 8.6 g/dL — ABNORMAL LOW (ref 12.0–15.0)

## 2019-10-17 LAB — BASIC METABOLIC PANEL
Anion gap: 9 (ref 5–15)
BUN: 43 mg/dL — ABNORMAL HIGH (ref 8–23)
CO2: 8 mmol/L — ABNORMAL LOW (ref 22–32)
Calcium: 8.6 mg/dL — ABNORMAL LOW (ref 8.9–10.3)
Chloride: 119 mmol/L — ABNORMAL HIGH (ref 98–111)
Creatinine, Ser: 2.22 mg/dL — ABNORMAL HIGH (ref 0.44–1.00)
GFR calc Af Amer: 26 mL/min — ABNORMAL LOW (ref >60)
GFR calc non Af Amer: 23 mL/min — ABNORMAL LOW (ref >60)
Glucose, Bld: 80 mg/dL (ref 70–99)
Potassium: 4.1 mmol/L (ref 3.5–5.1)
Sodium: 136 mmol/L (ref 135–145)

## 2019-10-17 LAB — CBC
HCT: 20.4 % — ABNORMAL LOW (ref 36.0–46.0)
Hemoglobin: 6.9 g/dL — ABNORMAL LOW (ref 12.0–15.0)
MCH: 31.8 pg (ref 26.0–34.0)
MCHC: 33.8 g/dL (ref 30.0–36.0)
MCV: 94 fL (ref 80.0–100.0)
Platelets: 229 10*3/uL (ref 150–400)
RBC: 2.17 MIL/uL — ABNORMAL LOW (ref 3.87–5.11)
RDW: 14.4 % (ref 11.5–15.5)
WBC: 5.7 10*3/uL (ref 4.0–10.5)
nRBC: 0 % (ref 0.0–0.2)

## 2019-10-17 LAB — PREPARE RBC (CROSSMATCH)

## 2019-10-17 MED ORDER — LOPERAMIDE HCL 2 MG PO CAPS
4.0000 mg | ORAL_CAPSULE | Freq: Two times a day (BID) | ORAL | Status: DC | PRN
  Administered 2019-10-17 – 2019-10-19 (×4): 4 mg via ORAL
  Filled 2019-10-17 (×4): qty 2

## 2019-10-17 MED ORDER — SODIUM BICARBONATE 650 MG PO TABS
1300.0000 mg | ORAL_TABLET | Freq: Three times a day (TID) | ORAL | Status: DC
  Administered 2019-10-17 – 2019-10-21 (×12): 1300 mg via ORAL
  Filled 2019-10-17 (×12): qty 2

## 2019-10-17 MED ORDER — SODIUM CHLORIDE 0.9% IV SOLUTION: Freq: Once | INTRAVENOUS | Status: AC

## 2019-10-17 MED ORDER — SODIUM CHLORIDE 0.45 % IV SOLN
INTRAVENOUS | Status: AC
  Filled 2019-10-17: qty 1000

## 2019-10-17 MED ORDER — SODIUM BICARBONATE 650 MG PO TABS
1300.0000 mg | ORAL_TABLET | Freq: Every day | ORAL | Status: DC
  Administered 2019-10-17: 09:00:00 1300 mg via ORAL
  Filled 2019-10-17: qty 2

## 2019-10-17 NOTE — Progress Notes (Signed)
This RN assessed patients ileostomy site. Ileostomy is leaking towards left side of abdomen due to large crease in skin. Patient is complaining of pain due to raw skin in peristoma area. Wound Ostomy nurse has been consulted. This RN spoke to [employer], Therapist, sports (wound ostomy nurse) for suggestions. Melody, RN will put in new orders and stated she may be able to come assess patient tomorrow.

## 2019-10-17 NOTE — Progress Notes (Addendum)
Ostomy belt, barrier ring and pouch supplies have been placed in patients room. Patient stated she has used ostomy belt before and said "its uncomfortable". Spoke to charge RN, the [employer], as well as the staff on [place] to find the ostomy powder. I was told this hospital does not carry ostomy powder #6 and will have to order it from another facility. Ostomy bag has been changed and a gauze dressing has been placed on left abdominal wound.

## 2019-10-17 NOTE — Progress Notes (Addendum)
Danielle [name]  MRN: [id number]  DOB/AGE: 1956-05-07 64 y.o.  Primary Care Physician:[facility], [place] date: 10/14/2019  Chief Complaint:  Chief Complaint  Patient presents with  . Dizziness    S-Pt presented on  10/14/2019 with  Chief Complaint  Patient presents with  . Dizziness  .    Pt today feels better  Medications . sodium chloride   Intravenous Once  . carvedilol  3.125 mg Oral BID WC  . citalopram  40 mg Oral Daily  . feeding supplement (NEPRO CARB STEADY)  237 mL Oral TID BM  . heparin  5,000 Units Subcutaneous Q8H  . multivitamin with minerals  1 tablet Oral Daily  . pantoprazole  40 mg Oral BID  . patiromer  8.4 g Oral Daily  . sodium bicarbonate  1,300 mg Oral Daily         NIO:EVOJJ from the symptoms mentioned above,there are no other symptoms referable to all systems reviewed.  Physical Exam: Vital signs in last 24 hours: Temp:  [98.5 F (36.9 C)-99.1 F (37.3 C)] 98.7 F (37.1 C) (01/02 0748) Pulse Rate:  [88-90] 89 (01/02 0748) Resp:  [19-20] 19 (01/02 0748) BP: (94-102)/(61-66) 94/65 (01/02 0748) SpO2:  [100 %] 100 % (01/02 0748) Weight change:  Last BM Date: 10/16/19  Intake/Output from previous day: 01/01 0701 - 01/02 0700 In: 2212.5 [P.O.:474; I.V.:1738.5] Out: 1325 [Urine:650; Stool:675] No intake/output data recorded.   Physical Exam: General- pt is awake,alert, oriented to time place and person Resp- No acute REsp distress, CTA B/L NO Rhonchi CVS- S1S2 regular in rate and rhythm GIT- BS+, soft, NT, ND, ileostomy bag in situ EXT- NO LE Edema, Cyanosis   Lab Results: CBC Recent Labs    10/16/19 0348 10/17/19 0643  WBC 5.7 5.7  HGB 7.4* 6.9*  HCT 21.6* 20.4*  PLT 230 229    BMET Recent Labs    10/16/19 0348 10/17/19 0643  NA 138 136  K 3.9 4.1  CL 120* 119*  CO2 10* 8*  GLUCOSE 105* 80  BUN 58* 43*  CREATININE 2.66* 2.22*  CALCIUM 9.2 8.6*   Creatinine trend  2020  4.8==> 2.2  during this admission 2.1--4.5 during early December admission  2019   1.6 0.8--2.3 acute kidney injury during October admission  0.7--2.0 acute kidney injury during August/September admission    Anion gap 136-127= 9  Albumin 3 Delta anion gap 0 Delta bicarb 24-8 is equal to 16   Potassium trend 7.2==>4.1   MICRO Recent Results (from the past 240 hour(s))  SARS CORONAVIRUS 2 (TAT 6-24 HRS) Nasopharyngeal Nasopharyngeal Swab     Status: Abnormal   Collection Time: 10/14/19  9:34 AM   Specimen: Nasopharyngeal Swab  Result Value Ref Range Status   SARS Coronavirus 2 POSITIVE (A) NEGATIVE Final    Comment: RESULT CALLED TO, READ BACK BY AND VERIFIED WITH: B.ALEJA RN 1710 10/14/2019 MCCORMICK K (NOTE) SARS-CoV-2 target nucleic acids are DETECTED. The SARS-CoV-2 RNA is generally detectable in upper and lower respiratory specimens during the acute phase of infection. Positive results are indicative of the presence of SARS-CoV-2 RNA. Clinical correlation with patient history and other diagnostic information is  necessary to determine patient infection status. Positive results do not rule out bacterial infection or co-infection with other viruses.  The expected result is Negative. Fact Sheet for Patients: SugarRoll.be Fact Sheet for Healthcare Providers: https://www.woods-mathews.com/ This test is not yet approved or cleared by the [place] FDA and  has been authorized for detection and/or diagnosis of SARS-CoV-2 by FDA under an Emergency Use Authorization (EUA). This EUA will remain  in effect (meaning this test can be used) for  the duration of the COVID-19 declaration under Section 564(b)(1) of the Act, 21 U.S.C. section 360bbb-3(b)(1), unless the authorization is terminated or revoked sooner. Performed at [facility], [place] [address] [place], [place] [address]       Lab Results  Component Value Date   CALCIUM 8.6  (L) 10/17/2019   PHOS 4.5 10/15/2019               Impression:   Pt is a 64 year old African-American  female with past medical history of COPD, congestive heart failure, lymphedema, history of breast cancer treated with mastectomy, chemoand radiation,history of esophageal cancer, ischemic colitis with history of hemicolectomy and ileostomy, hypertension and diabetes  who was admitted on 10/14/2019 with acute renal failure, abdominal pain.    1)Renal  AKI secondary to prerenal/ATN AKI on CKD CKD stage 4. CKD since 2019  CKD secondary to diabetes mellitus/hypertension  Progression of CKD has been marked with multiple episodes of acute kidney injury  AKI now better Creatinine trending down   2)HTN  Medication-  On Alpha and beta Blockers  3)Anemia of chronic disease  HGb is not at goal (9--11) Patient is received PRBC Occult cards ordered  4) hypophosphatemia Patient had phosphorus level of less than 1 earlier now Phosphorus at goal.   5) diabetes mellitus type 2 Patient has history of diabetes mellitus is being followed with the primary team    6) hyperkalemia Now better  7)Acid base Co2 not at goal Non-anion gap acidosis secondary to GI losses Patient on p.o. bicarb We will increase the frequency  8) COVID Positive Primary team following    Plan:  Agree with the blood transfusion Will give IV Bicarb for 12 hours and  Will increase p.o. bicarb to 3 times daily Will follow Chem-7   Taiyo Kozma s Dominie Benedick 10/17/2019, 9:22 AM

## 2019-10-17 NOTE — Progress Notes (Signed)
PROGRESS NOTE    Danielle [name]  XNA:[id number] DOB: 1955/12/16 DOA: 10/14/2019 PCP: [facility], [facility]       Assessment & Plan:   Principal Problem:   Hyperkalemia Active Problems:   Moderate COPD (chronic obstructive pulmonary disease) (HCC)   AKI (acute kidney injury) (HCC)   Chronic systolic heart failure (HCC)   HTN (hypertension)   CKD (chronic kidney disease) stage 4, GFR 15-29 ml/min (HCC)   History of 2019 novel coronavirus disease (COVID-19)   AKI on CKD 4: etiology possibly related to ATN & recent hospitalization for COVID-19 pneumonia, as well as diuretic therapy. Cr continues to trend down. Nephro recs apprec  Likely ACD: likely secondary to CKD. Hb <7.0. Will transfuse 1 unit of PRBCs today. Repeat H&H 4 hours post transfusion. Will order fecal occult   Metabolic acidosis: started on bicarb drip, increased po dose of bicarb. Nephro recs apprec  Hyperkalemia: secondary to ATN. Resolved  Likely PAF: Will continue on carvedilol. IV lopressor prn. S/p  IV digoxin x 1 on 10/14/19. Hold carvedilol/lopressor for MAP <65 and/or HR<65. Unknown why pt is not on anticoagulation, possible fall risk.  Hyponatremia: resolved  History of COVID19: symptomatic for residual weakness. Repeat COVID19 test POSITIVE. Pt already completed a course of remdesivir so will not order. Pt is not currently requiring supplemental oxygen   Generalized weakness: PT/OT rec home health PT/OT. Home health orders placed  Chronic systolic heart failure: euvolemic.Continue carvedilol. Continue to hold ACE-I & lasix for AKI.  Monitor for overload in view of IV hydration with normal saline  HTN: continue home dose of carvedilol  Moderate COPD: w/ exacerbation. Continue on proventil   Hx of ostomy: ostomy bag is very leaky. Ostomy nurse to put in some new orders today. Ostomy nurse following and recs apprec   DVT prophylaxis: heparin Code Status: full Family Communication:   Disposition Plan:   Consultants:   nephro   Procedures:    Antimicrobials: n/a   Subjective: Pt c/o malaise  Objective: Vitals:   10/16/19 1625 10/16/19 2010 10/17/19 0335 10/17/19 0748  BP: 102/66 95/61 98/62  94/65  Pulse: 90 88 88 89  Resp: 19 20 20 19   Temp: 99.1 F (37.3 C) 98.6 F (37 C) 98.5 F (36.9 C) 98.7 F (37.1 C)  TempSrc: Oral Oral Oral Oral  SpO2: 100% 100% 100% 100%  Weight:      Height:        Intake/Output Summary (Last 24 hours) at 10/17/2019 0830 Last data filed at 10/17/2019 0748 Gross per 24 hour  Intake 2212.47 ml  Output 1325 ml  Net 887.47 ml   Filed Weights   10/13/19 2328 10/14/19 1622  Weight: 55.8 kg 51.6 kg    Examination:  General exam: Appears calm and comfortable  Respiratory system: diminished breath sounds b/l. No rales, rhonci. Respiratory effort normal. Cardiovascular system: S1 & S2 +. No  rubs, gallops or clicks. Gastrointestinal system: Abdomen is nondistended, soft and nontender.  Normal bowel sounds heard. Central nervous system: Alert and oriented. Moves all 4 extremities  Psychiatry: Judgement and insight appear normal. Flat mood and affect      Data Reviewed: I have personally reviewed following labs and imaging studies  CBC: Recent Labs  Lab 10/13/19 2345 10/14/19 0604 10/15/19 0503 10/16/19 0348 10/17/19 0643  WBC 7.2 6.5 5.9 5.7 5.7  NEUTROABS 5.7  --   --   --   --   HGB 9.9* 8.9* 7.7* 7.4* 6.9*  HCT  29.5* 27.6* 22.8* 21.6* 20.4*  MCV 92.8 97.9 91.6 91.9 94.0  PLT 261 249 228 230 355   Basic Metabolic Panel: Recent Labs  Lab 10/14/19 0242 10/14/19 0604 10/15/19 0503 10/16/19 0348 10/17/19 0643  NA 128* 130* 136 138 136  K 5.8* 5.2* 5.1 3.9 4.1  CL 104 107 115* 120* 119*  CO2 12* 12* 12* 10* 8*  GLUCOSE 232* 96 79 105* 80  BUN 95* 98* 80* 58* 43*  CREATININE 4.71* 4.60* 3.26* 2.66* 2.22*  CALCIUM 10.9* 10.9* 9.6 9.2 8.6*  PHOS  --   --  4.5  --   --    GFR: Estimated Creatinine  Clearance: 20.6 mL/min (A) (by C-G formula based on SCr of 2.22 mg/dL (H)). Liver Function Tests: Recent Labs  Lab 10/13/19 2345  AST 15  ALT 13  ALKPHOS 68  BILITOT 0.8  PROT 8.7*  ALBUMIN 4.2   No results for input(s): LIPASE, AMYLASE in the last 168 hours. No results for input(s): AMMONIA in the last 168 hours. Coagulation Profile: No results for input(s): INR, PROTIME in the last 168 hours. Cardiac Enzymes: No results for input(s): CKTOTAL, CKMB, CKMBINDEX, TROPONINI in the last 168 hours. BNP (last 3 results) No results for input(s): PROBNP in the last 8760 hours. HbA1C: Recent Labs    10/15/19 0503  HGBA1C 4.9   CBG: Recent Labs  Lab 10/14/19 2023  GLUCAP 125*   Lipid Profile: No results for input(s): CHOL, HDL, LDLCALC, TRIG, CHOLHDL, LDLDIRECT in the last 72 hours. Thyroid Function Tests: No results for input(s): TSH, T4TOTAL, FREET4, T3FREE, THYROIDAB in the last 72 hours. Anemia Panel: No results for input(s): VITAMINB12, FOLATE, FERRITIN, TIBC, IRON, RETICCTPCT in the last 72 hours. Sepsis Labs: No results for input(s): PROCALCITON, LATICACIDVEN in the last 168 hours.  Recent Results (from the past 240 hour(s))  SARS CORONAVIRUS 2 (TAT 6-24 HRS) Nasopharyngeal Nasopharyngeal Swab     Status: Abnormal   Collection Time: 10/14/19  9:34 AM   Specimen: Nasopharyngeal Swab  Result Value Ref Range Status   SARS Coronavirus 2 POSITIVE (A) NEGATIVE Final    Comment: RESULT CALLED TO, READ BACK BY AND VERIFIED WITH: B.ALEJA RN 1710 10/14/2019 MCCORMICK K (NOTE) SARS-CoV-2 target nucleic acids are DETECTED. The SARS-CoV-2 RNA is generally detectable in upper and lower respiratory specimens during the acute phase of infection. Positive results are indicative of the presence of SARS-CoV-2 RNA. Clinical correlation with patient history and other diagnostic information is  necessary to determine patient infection status. Positive results do not rule out bacterial  infection or co-infection with other viruses.  The expected result is Negative. Fact Sheet for Patients: SugarRoll.be Fact Sheet for Healthcare Providers: https://www.woods-mathews.com/ This test is not yet approved or cleared by the [place] FDA and  has been authorized for detection and/or diagnosis of SARS-CoV-2 by FDA under an Emergency Use Authorization (EUA). This EUA will remain  in effect (meaning this test can be used) for  the duration of the COVID-19 declaration under Section 564(b)(1) of the Act, 21 U.S.C. section 360bbb-3(b)(1), unless the authorization is terminated or revoked sooner. Performed at [facility], [place] [address], [place], [place] [address]          Radiology Studies: DG Abd Portable 1V  Result Date: 10/16/2019 CLINICAL DATA:  Generalized abdominal pain EXAM: PORTABLE ABDOMEN - 1 VIEW COMPARISON:  07/20/2018 FINDINGS: Stomach is distended with air. No obstructive changes are seen. No free air is noted. Diffuse aortic  calcifications are noted without aneurysmal dilatation. Tubal ligation clips are seen. Degenerative changes of lumbar spine are noted. IMPRESSION: No acute abnormality seen. Electronically Signed   By: Inez Catalina M.D.   On: 10/16/2019 11:33        Scheduled Meds: . sodium chloride   Intravenous Once  . carvedilol  3.125 mg Oral BID WC  . citalopram  40 mg Oral Daily  . feeding supplement (NEPRO CARB STEADY)  237 mL Oral TID BM  . heparin  5,000 Units Subcutaneous Q8H  . multivitamin with minerals  1 tablet Oral Daily  . pantoprazole  40 mg Oral BID  . patiromer  8.4 g Oral Daily   Continuous Infusions: . sodium chloride 50 mL/hr at 10/17/19 0500     LOS: 3 days    Time spent: 31 mins    Wyvonnia Dusky, MD [facility] Pager [phone]  If 7PM-7AM, please contact night-coverage [email or web address] Password [email or web address] 10/17/2019, 8:30 AM

## 2019-10-17 NOTE — Consult Note (Signed)
Re-consulted for leaking ostomy Patient with high volume output and stoma in an abdominal skin fold.  [place]  Nurse coverage remotely this weekend for all [facility] inpatient sites Discussed with bedside nurse current care and needs; updated orders with following suggestions  Prior to pouch change; assemble supplies and cut new skin barrier to fit stoma size; making sure it is as close to the exact size of the stoma as possible.  Supplies: ostomy barrier ring [id number]; flex convex pouch [id number]; ostomy belt Kellie Simmering [id number]; ostomy powder # 6   1. Skin care for peristomal skin: sprinkle ostomy powder Kellie Simmering #6 obtain from materials) on affected skin, brush away excess; tap lightly with skin barrier wipe (Cavilon no sting Kellie Simmering [id number]) may be in floor supply room.   2. Place ostomy barrier ring around stoma; add 1/2 of additional barrier ring from 12-6 oclock (left lateral side of the stoma (since it is leaking at this site).   3. Place new pouch; making sure to have patient place hand over the pouch for 10 minutes to warm skin barrier on the patients skin   4. Add belt around patient's waist; to belt tabs   5. Pouch must be emptied at least hourly due to high volumes of output  Will follow up with staff in the next 24 hours after implementation of above suggestions  [place], [place], [place]

## 2019-10-17 NOTE — Progress Notes (Signed)
Patient complained of 6/10 pressure on left side of chest. Patients vitals WDL. MD notified. Completed EKG and MD ordered troponin.

## 2019-10-18 LAB — BASIC METABOLIC PANEL
Anion gap: 6 (ref 5–15)
BUN: 36 mg/dL — ABNORMAL HIGH (ref 8–23)
CO2: 16 mmol/L — ABNORMAL LOW (ref 22–32)
Calcium: 8.3 mg/dL — ABNORMAL LOW (ref 8.9–10.3)
Chloride: 119 mmol/L — ABNORMAL HIGH (ref 98–111)
Creatinine, Ser: 1.98 mg/dL — ABNORMAL HIGH (ref 0.44–1.00)
GFR calc Af Amer: 30 mL/min — ABNORMAL LOW (ref >60)
GFR calc non Af Amer: 26 mL/min — ABNORMAL LOW (ref >60)
Glucose, Bld: 77 mg/dL (ref 70–99)
Potassium: 3.4 mmol/L — ABNORMAL LOW (ref 3.5–5.1)
Sodium: 141 mmol/L (ref 135–145)

## 2019-10-18 LAB — CBC
HCT: 24.5 % — ABNORMAL LOW (ref 36.0–46.0)
Hemoglobin: 8.2 g/dL — ABNORMAL LOW (ref 12.0–15.0)
MCH: 31.1 pg (ref 26.0–34.0)
MCHC: 33.5 g/dL (ref 30.0–36.0)
MCV: 92.8 fL (ref 80.0–100.0)
Platelets: 248 10*3/uL (ref 150–400)
RBC: 2.64 MIL/uL — ABNORMAL LOW (ref 3.87–5.11)
RDW: 15.6 % — ABNORMAL HIGH (ref 11.5–15.5)
WBC: 6.4 10*3/uL (ref 4.0–10.5)
nRBC: 0 % (ref 0.0–0.2)

## 2019-10-18 LAB — TYPE AND SCREEN
ABO/RH(D): O POS
Antibody Screen: NEGATIVE
Unit division: 0

## 2019-10-18 LAB — BPAM RBC
Blood Product Expiration Date: 202101092359
ISSUE DATE / TIME: 202101021101
Unit Type and Rh: 5100

## 2019-10-18 MED ORDER — ALBUTEROL SULFATE HFA 108 (90 BASE) MCG/ACT IN AERS
2.0000 | INHALATION_SPRAY | Freq: Four times a day (QID) | RESPIRATORY_TRACT | Status: DC | PRN
  Administered 2019-10-20: 11:00:00 2 via RESPIRATORY_TRACT
  Filled 2019-10-18 (×2): qty 6.7

## 2019-10-18 MED ORDER — SODIUM CHLORIDE 0.45 % IV SOLN
INTRAVENOUS | Status: AC
  Filled 2019-10-18 (×2): qty 1000

## 2019-10-18 MED ORDER — SODIUM CHLORIDE 0.9 % IV BOLUS
500.0000 mL | Freq: Once | INTRAVENOUS | Status: AC
  Administered 2019-10-18: 500 mL via INTRAVENOUS

## 2019-10-18 MED ORDER — PHENOL 1.4 % MT LIQD
1.0000 | OROMUCOSAL | Status: DC | PRN
  Administered 2019-10-18: 06:00:00 1 via OROMUCOSAL
  Filled 2019-10-18: qty 177

## 2019-10-18 NOTE — Progress Notes (Signed)
PROGRESS NOTE    Danielle [name]  LZJ:[id number] DOB: 01-23-1956 DOA: 10/14/2019 PCP: [facility]       Assessment & Plan:   Principal Problem:   Hyperkalemia Active Problems:   Moderate COPD (chronic obstructive pulmonary disease) (HCC)   AKI (acute kidney injury) (HCC)   Chronic systolic heart failure (HCC)   HTN (hypertension)   CKD (chronic kidney disease) stage 4, GFR 15-29 ml/min (HCC)   History of 2019 novel coronavirus disease (COVID-19)   AKI on CKD 4: etiology possibly related to ATN & recent hospitalization for COVID-19 pneumonia, as well as diuretic therapy. Cr is trending down today again. Nephro recs apprec  Likely ACD: likely secondary to CKD. S/p 1 unit of PRBCs on 10/17/19. Fecal occult ordered but not collected yet   Metabolic acidosis: started on bicarb drip, increased po dose of bicarb. Improving. Nephro recs apprec  Elevated troponins: likely secondary to demand ischemia. Pt also has significant CKD. EKG shows no acute ischemic changes.  Hyperkalemia: secondary to ATN. Resolved  Likely PAF: Will continue on carvedilol. IV lopressor prn. S/p  IV digoxin x 1 on 10/14/19. Hold carvedilol/lopressor for MAP <65 and/or HR<65. Unknown why pt is not on anticoagulation, possible fall risk.  Hyponatremia: resolved  History of COVID19: symptomatic for residual weakness & sore throat. Chloraseptic spray prn. Repeat COVID19 test POSITIVE. Pt already completed a course of remdesivir so will not order. Pt is not currently requiring supplemental oxygen   Generalized weakness: PT/OT rec home health PT/OT. Home health orders placed  Chronic systolic heart failure: euvolemic.Continue carvedilol. Continue to hold ACE-I & lasix for AKI.  Monitor for fluid overload   HTN: continue home dose of carvedilol  Moderate COPD: w/o exacerbation. Continue on proventil. Encourage incentive spirometry   Hx of ostomy: ostomy bag not leaking when pt was examined  today.  Ostomy nurse should see the pt tomorrow. Recs apprec.   DVT prophylaxis: heparin Code Status: full Family Communication:  Disposition Plan:   Consultants:   nephro   Procedures:    Antimicrobials: n/a   Subjective: Pt c/o abd pain  Objective: Vitals:   10/17/19 1537 10/17/19 1944 10/18/19 0316 10/18/19 0726  BP: 107/67 111/75 96/62 (!) 89/47  Pulse: 83 90 96 82  Resp: 20 18 18 19   Temp: 99.1 F (37.3 C) 98.7 F (37.1 C) 98.2 F (36.8 C) 98.4 F (36.9 C)  TempSrc: Oral Oral Oral Oral  SpO2: 100% 100% 100% 99%  Weight:      Height:        Intake/Output Summary (Last 24 hours) at 10/18/2019 0855 Last data filed at 10/18/2019 0630 Gross per 24 hour  Intake 550.05 ml  Output 1800 ml  Net -1249.95 ml   Filed Weights   10/13/19 2328 10/14/19 1622  Weight: 55.8 kg 51.6 kg    Examination:  General exam: Appears calm and comfortable  Respiratory system: decreased breath sounds b/l. No rales,wheezes Respiratory effort normal. Cardiovascular system: S1 & S2 +. No  rubs, gallops or clicks. Gastrointestinal system: Abdomen is nondistended, soft and tenderness to palpation around ostomy site.  Normal bowel sounds heard. Central nervous system: Alert and oriented. Moves all 4 extremities  Psychiatry: Judgement and insight appear normal. Flat mood and affect      Data Reviewed: I have personally reviewed following labs and imaging studies  CBC: Recent Labs  Lab 10/13/19 2345 10/14/19 0604 10/15/19 0503 10/16/19 0348 10/17/19 0643 10/17/19 2000 10/18/19 0535  WBC 7.2  6.5 5.9 5.7 5.7  --  6.4  NEUTROABS 5.7  --   --   --   --   --   --   HGB 9.9* 8.9* 7.7* 7.4* 6.9* 8.6* 8.2*  HCT 29.5* 27.6* 22.8* 21.6* 20.4* 26.5* 24.5*  MCV 92.8 97.9 91.6 91.9 94.0  --  92.8  PLT 261 249 228 230 229  --  409   Basic Metabolic Panel: Recent Labs  Lab 10/14/19 0604 10/15/19 0503 10/16/19 0348 10/17/19 0643 10/18/19 0535  NA 130* 136 138 136 141  K 5.2* 5.1  3.9 4.1 3.4*  CL 107 115* 120* 119* 119*  CO2 12* 12* 10* 8* 16*  GLUCOSE 96 79 105* 80 77  BUN 98* 80* 58* 43* 36*  CREATININE 4.60* 3.26* 2.66* 2.22* 1.98*  CALCIUM 10.9* 9.6 9.2 8.6* 8.3*  PHOS  --  4.5  --   --   --    GFR: Estimated Creatinine Clearance: 23.1 mL/min (A) (by C-G formula based on SCr of 1.98 mg/dL (H)). Liver Function Tests: Recent Labs  Lab 10/13/19 2345  AST 15  ALT 13  ALKPHOS 68  BILITOT 0.8  PROT 8.7*  ALBUMIN 4.2   No results for input(s): LIPASE, AMYLASE in the last 168 hours. No results for input(s): AMMONIA in the last 168 hours. Coagulation Profile: No results for input(s): INR, PROTIME in the last 168 hours. Cardiac Enzymes: No results for input(s): CKTOTAL, CKMB, CKMBINDEX, TROPONINI in the last 168 hours. BNP (last 3 results) No results for input(s): PROBNP in the last 8760 hours. HbA1C: No results for input(s): HGBA1C in the last 72 hours. CBG: Recent Labs  Lab 10/14/19 2023  GLUCAP 125*   Lipid Profile: No results for input(s): CHOL, HDL, LDLCALC, TRIG, CHOLHDL, LDLDIRECT in the last 72 hours. Thyroid Function Tests: No results for input(s): TSH, T4TOTAL, FREET4, T3FREE, THYROIDAB in the last 72 hours. Anemia Panel: No results for input(s): VITAMINB12, FOLATE, FERRITIN, TIBC, IRON, RETICCTPCT in the last 72 hours. Sepsis Labs: No results for input(s): PROCALCITON, LATICACIDVEN in the last 168 hours.  Recent Results (from the past 240 hour(s))  SARS CORONAVIRUS 2 (TAT 6-24 HRS) Nasopharyngeal Nasopharyngeal Swab     Status: Abnormal   Collection Time: 10/14/19  9:34 AM   Specimen: Nasopharyngeal Swab  Result Value Ref Range Status   SARS Coronavirus 2 POSITIVE (A) NEGATIVE Final    Comment: RESULT CALLED TO, READ BACK BY AND VERIFIED WITH: B.ALEJA RN 1710 10/14/2019 MCCORMICK K (NOTE) SARS-CoV-2 target nucleic acids are DETECTED. The SARS-CoV-2 RNA is generally detectable in upper and lower respiratory specimens during the  acute phase of infection. Positive results are indicative of the presence of SARS-CoV-2 RNA. Clinical correlation with patient history and other diagnostic information is  necessary to determine patient infection status. Positive results do not rule out bacterial infection or co-infection with other viruses.  The expected result is Negative. Fact Sheet for Patients: SugarRoll.be Fact Sheet for Healthcare Providers: https://www.woods-mathews.com/ This test is not yet approved or cleared by the [place] FDA and  has been authorized for detection and/or diagnosis of SARS-CoV-2 by FDA under an Emergency Use Authorization (EUA). This EUA will remain  in effect (meaning this test can be used) for  the duration of the COVID-19 declaration under Section 564(b)(1) of the Act, 21 U.S.C. section 360bbb-3(b)(1), unless the authorization is terminated or revoked sooner. Performed at [facility], [place] [address], [place], [place] [address]  Radiology Studies: DG Abd Portable 1V  Result Date: 10/16/2019 CLINICAL DATA:  Generalized abdominal pain EXAM: PORTABLE ABDOMEN - 1 VIEW COMPARISON:  07/20/2018 FINDINGS: Stomach is distended with air. No obstructive changes are seen. No free air is noted. Diffuse aortic calcifications are noted without aneurysmal dilatation. Tubal ligation clips are seen. Degenerative changes of lumbar spine are noted. IMPRESSION: No acute abnormality seen. Electronically Signed   By: Inez Catalina M.D.   On: 10/16/2019 11:33        Scheduled Meds: . carvedilol  3.125 mg Oral BID WC  . citalopram  40 mg Oral Daily  . feeding supplement (NEPRO CARB STEADY)  237 mL Oral TID BM  . heparin  5,000 Units Subcutaneous Q8H  . multivitamin with minerals  1 tablet Oral Daily  . pantoprazole  40 mg Oral BID  . sodium bicarbonate  1,300 mg Oral TID   Continuous Infusions:    LOS: 4 days    Time spent: 30  mins    Wyvonnia Dusky, MD [facility] Pager [phone]  If 7PM-7AM, please contact night-coverage [email or web address] Password [facility] 10/18/2019, 8:55 AM

## 2019-10-18 NOTE — Progress Notes (Signed)
Danielle [name]  MRN: [id number]  DOB/AGE: [birth year] 64 y.o.  Primary Care Physician:[facility] [place] date: 10/14/2019  Chief Complaint:  Chief Complaint  Patient presents with  . Dizziness    S-Pt presented on  10/14/2019 with  Chief Complaint  Patient presents with  . Dizziness  .    Pt today feels better  Medications . carvedilol  3.125 mg Oral BID WC  . citalopram  40 mg Oral Daily  . feeding supplement (NEPRO CARB STEADY)  237 mL Oral TID BM  . heparin  5,000 Units Subcutaneous Q8H  . multivitamin with minerals  1 tablet Oral Daily  . pantoprazole  40 mg Oral BID  . patiromer  8.4 g Oral Daily  . sodium bicarbonate  1,300 mg Oral TID         [id number] from the symptoms mentioned above,there are no other symptoms referable to all systems reviewed.  Physical Exam: Vital signs in last 24 hours: Temp:  [97.8 F (36.6 C)-99.1 F (37.3 C)] 98.4 F (36.9 C) (01/03 0726) Pulse Rate:  [82-97] 82 (01/03 0726) Resp:  [18-20] 19 (01/03 0726) BP: (89-111)/(47-75) 89/47 (01/03 0726) SpO2:  [99 %-100 %] 99 % (01/03 0726) Weight change:  Last BM Date: 10/16/18  Intake/Output from previous day: 01/02 0701 - 01/03 0700 In: 550.1 [I.V.:20.1; Blood:530] Out: 1800 [Urine:800; Stool:1000] No intake/output data recorded.   Physical Exam: General- pt is awake,alert, oriented to time place and person Resp- No acute REsp distress, CTA B/L NO Rhonchi CVS- S1S2 regular in rate and rhythm GIT- BS+, soft, NT, ND, ileostomy bag in situ EXT- NO LE Edema, Cyanosis   Lab Results: CBC Recent Labs    10/17/19 0643 10/17/19 2000 10/18/19 0535  WBC 5.7  --  6.4  HGB 6.9* 8.6* 8.2*  HCT 20.4* 26.5* 24.5*  PLT 229  --  248    BMET Recent Labs    10/17/19 0643 10/18/19 0535  NA 136 141  K 4.1 3.4*  CL 119* 119*  CO2 8* 16*  GLUCOSE 80 77  BUN 43* 36*  CREATININE 2.22* 1.98*  CALCIUM 8.6* 8.3*   Creatinine trend  2020  4.8==> 2.2==>1.98  during this admission 2.1--4.5 during early December admission  2019   1.6 0.8--2.3 acute kidney injury during October admission  0.7--2.0 acute kidney injury during August/September admission    Anion gap 141-135= 6  Albumin 3 Delta anion gap 0 Delta bicarb 24-16 is equal to 8 Now Delta Bicarb is 8   Potassium trend 7.2==>4.1==> 3.4   MICRO Recent Results (from the past 240 hour(s))  SARS CORONAVIRUS 2 (TAT 6-24 HRS) Nasopharyngeal Nasopharyngeal Swab     Status: Abnormal   Collection Time: 10/14/19  9:34 AM   Specimen: Nasopharyngeal Swab  Result Value Ref Range Status   SARS Coronavirus 2 POSITIVE (A) NEGATIVE Final    Comment: RESULT CALLED TO, READ BACK BY AND VERIFIED WITH: B.ALEJA RN 1710 10/14/2019 MCCORMICK K (NOTE) SARS-CoV-2 target nucleic acids are DETECTED. The SARS-CoV-2 RNA is generally detectable in upper and lower respiratory specimens during the acute phase of infection. Positive results are indicative of the presence of SARS-CoV-2 RNA. Clinical correlation with patient history and other diagnostic information is  necessary to determine patient infection status. Positive results do not rule out bacterial infection or co-infection with other viruses.  The expected result is Negative. Fact Sheet for Patients: SugarRoll.be Fact Sheet for Healthcare Providers: https://www.woods-mathews.com/ This test is not yet approved  or cleared by the [place] and  has been authorized for detection and/or diagnosis of SARS-CoV-2 by FDA under an Emergency Use Authorization (EUA). This EUA will remain  in effect (meaning this test can be used) for  the duration of the COVID-19 declaration under Section 564(b)(1) of the Act, 21 U.S.C. section 360bbb-3(b)(1), unless the authorization is terminated or revoked sooner. Performed at [facility], [place] [address], [place], [place] [address]       Lab Results   Component Value Date   CALCIUM 8.3 (L) 10/18/2019   PHOS 4.5 10/15/2019               Impression:   Pt is a 64 year old African-American  female with past medical history of COPD, congestive heart failure, lymphedema, history of breast cancer treated with mastectomy, chemoand radiation,history of esophageal cancer, ischemic colitis with history of hemicolectomy and ileostomy, hypertension and diabetes  who was admitted on 10/14/2019 with acute renal failure, abdominal pain.    1)Renal  AKI secondary to prerenal/ATN AKI on CKD CKD stage 4. CKD since 2019  CKD secondary to diabetes mellitus/hypertension  Progression of CKD has been marked with multiple episodes of acute kidney injury  AKI now better Creatinine trending down   2)HTN  Medication-  On Alpha and beta Blockers  3)Anemia of chronic disease  HGb is not at goal (9--11) Patient is received PRBC Occult cards ordered  4) hypophosphatemia Patient had phosphorus level of less than 1 earlier now Phosphorus at goal.   5) diabetes mellitus type 2 Patient has history of diabetes mellitus is being followed with the primary team    6) hypokalemia Pt admitted with hyperkalemia Now  Hypokalemia sec to GI lossess+ Veltassa Will replete   7)Acid base Co2 not at goal Non-anion gap acidosis secondary to GI losses Patient on p.o. bicarb Bicarb now better   8) COVID positive Primary team following   Plan:  Will d/c veltassa Will replte K  Will follow Chem-7   Meliton Samad s [facility] 10/18/2019, 8:52 AM

## 2019-10-19 LAB — BASIC METABOLIC PANEL
Anion gap: 7 (ref 5–15)
BUN: 25 mg/dL — ABNORMAL HIGH (ref 8–23)
CO2: 18 mmol/L — ABNORMAL LOW (ref 22–32)
Calcium: 7.8 mg/dL — ABNORMAL LOW (ref 8.9–10.3)
Chloride: 114 mmol/L — ABNORMAL HIGH (ref 98–111)
Creatinine, Ser: 1.75 mg/dL — ABNORMAL HIGH (ref 0.44–1.00)
GFR calc Af Amer: 35 mL/min — ABNORMAL LOW (ref >60)
GFR calc non Af Amer: 30 mL/min — ABNORMAL LOW (ref >60)
Glucose, Bld: 82 mg/dL (ref 70–99)
Potassium: 4 mmol/L (ref 3.5–5.1)
Sodium: 139 mmol/L (ref 135–145)

## 2019-10-19 LAB — CBC
HCT: 22.4 % — ABNORMAL LOW (ref 36.0–46.0)
Hemoglobin: 7.9 g/dL — ABNORMAL LOW (ref 12.0–15.0)
MCH: 31 pg (ref 26.0–34.0)
MCHC: 35.3 g/dL (ref 30.0–36.0)
MCV: 87.8 fL (ref 80.0–100.0)
Platelets: 229 10*3/uL (ref 150–400)
RBC: 2.55 MIL/uL — ABNORMAL LOW (ref 3.87–5.11)
RDW: 15.9 % — ABNORMAL HIGH (ref 11.5–15.5)
WBC: 6.3 10*3/uL (ref 4.0–10.5)
nRBC: 0 % (ref 0.0–0.2)

## 2019-10-19 LAB — OCCULT BLOOD X 1 CARD TO LAB, STOOL: Fecal Occult Bld: NEGATIVE

## 2019-10-19 MED ORDER — ALUM & MAG HYDROXIDE-SIMETH 200-200-20 MG/5ML PO SUSP
30.0000 mL | Freq: Once | ORAL | Status: AC
  Administered 2019-10-19: 19:00:00 30 mL via ORAL
  Filled 2019-10-19: qty 30

## 2019-10-19 MED ORDER — HYOSCYAMINE SULFATE 0.125 MG SL SUBL
0.2500 mg | SUBLINGUAL_TABLET | Freq: Once | SUBLINGUAL | Status: AC
  Administered 2019-10-19: 0.25 mg via SUBLINGUAL
  Filled 2019-10-19: qty 2

## 2019-10-19 NOTE — Progress Notes (Signed)
Physical Therapy Treatment Patient Details Name: Danielle [name] MRN: [id number] DOB: [birth year] Today's Date: 10/19/2019    History of Present Illness 64 year old female admitted with hyperkalemia, reports of dizziness, back and abdominal pain.[name] Recently discharged Covid +. PMH includes: COPD, AKI, HF, HTN, CKD    PT Comments    Patient received in bed, playing games on phone. Agrees to PT session. Patient performed bed mobility with supervision. Transfers sit to stand with min guard, ambulated 60 feet without AD. Supervision/min guard. Patient limited by weakness, but did not require UE assist  For steadying this visit. Patient will continue to benefit from skilled PT while here to improve strength and functional independence.      Follow Up Recommendations  Home health PT;Supervision - Intermittent     Equipment Recommendations  None recommended by PT    Recommendations for Other Services       Precautions / Restrictions Precautions Precautions: Fall Precaution Comments: mod fall Restrictions Weight Bearing Restrictions: No    Mobility  Bed Mobility Overal bed mobility: Independent             General bed mobility comments: no physical assist needed to get in/out of bed  Transfers Overall transfer level: Modified independent   Transfers: Sit to/from Stand Sit to Stand: Min guard;Supervision            Ambulation/Gait Ambulation/Gait assistance: Min guard Gait Distance (Feet): 60 Feet Assistive device: None Gait Pattern/deviations: Step-through pattern;Narrow base of support;Decreased stride length Gait velocity: decreased   General Gait Details: patient with min SOB, requring min guard due to weakness.   Stairs             Wheelchair Mobility    Modified Rankin (Stroke Patients Only)       Balance Overall balance assessment: Modified Independent Sitting-balance support: Feet supported Sitting balance-Leahy Scale: Good     Standing  balance support: During functional activity Standing balance-Leahy Scale: Good Standing balance comment: slight unsteadiness, no gross LOB, able to walk without UE support this visit.                            Cognition   Behavior During Therapy: WFL for tasks assessed/performed Overall Cognitive Status: Within Functional Limits for tasks assessed                                        Exercises Other Exercises Other Exercises: seated LAQ, marching x 10 reps each, STS x 5 reps    General Comments        Pertinent Vitals/Pain Pain Assessment: No/denies pain    Home Living                      Prior Function            PT Goals (current goals can now be found in the care plan section) Acute Rehab PT Goals Patient Stated Goal: to return home, feel better PT Goal Formulation: With patient Time For Goal Achievement: 10/29/19 Potential to Achieve Goals: Good Progress towards PT goals: Progressing toward goals    Frequency    Min 2X/week      PT Plan Current plan remains appropriate    Co-evaluation              AM-PAC PT "6 Clicks" Mobility  Outcome Measure  Help needed turning from your back to your side while in a flat bed without using bedrails?: None Help needed moving from lying on your back to sitting on the side of a flat bed without using bedrails?: None Help needed moving to and from a bed to a chair (including a wheelchair)?: A Little Help needed standing up from a chair using your arms (e.g., wheelchair or bedside chair)?: A Little Help needed to walk in hospital room?: A Little Help needed climbing 3-5 steps with a railing? : A Little 6 Click Score: 20    End of Session Equipment Utilized During Treatment: Gait belt Activity Tolerance: Patient tolerated treatment well;Patient limited by fatigue Patient left: in bed;with call bell/phone within reach Nurse Communication: Mobility status;Other  (comment)(patient would like assist getting set up for bath) PT Visit Diagnosis: Muscle weakness (generalized) (M62.81);Difficulty in walking, not elsewhere classified (R26.2)     Time: 1610-9604 PT Time Calculation (min) (ACUTE ONLY): 14 min  Charges:  $Gait Training: 8-22 mins                     Talley Kreiser, PT, GCS 10/19/19,12:14 PM

## 2019-10-19 NOTE — Consult Note (Addendum)
[place] Nurse ostomy follow-up consult note Pt is familiar to the [place] [facility] team from previous admissions; she has a high output ileostomy and surgery was performed in fall of 2019.  She has a difficult pouching situation related to a flush stoma which is located in a crease and high outputs of liquid stool.  She states she was independent with pouch application and emptying prior to admission and was using a one piece convex pouch and barrier ring.  She declines to use a belt, although they have been recommended, and states they are too tight and uncomfortable for her to wear.  She recently had skin breakdown occur around the stoma related to leaking issues.  Stoma type/location: Stoma is red and viable, 1 1/4 inches, flush with skin level.  There is a crease to the skin at 3:00 o'clock Peristomal assessment: previously reported maceration to skin surrounding stoma has resolved; 1 cm surrounding stoma is red at this time Output: large amt liquid green stool; pt requires emptying of the pouch every 2 hours to avoid overfilling which leads to leakage. Pt was also currently wearing a flat pouch which was leaking behind the barrier.  Ostomy pouching: 1pc.  Education provided:  Pt appears to be well-informed regarding pouch application, emptying, and routines.  She states she requires assistance while she is ill and weak while in the hospital.  Applied one piece flexible convex pouch and barrier ring in the crease and surrounding the stoma to attempt to maintain a seal.  Extra supplies ordered to the bedside for staff nurses use.  Please re-consult if further assistance is needed.  Thank-you,  Julien Girt MSN, [place], [place], [place], [place]

## 2019-10-19 NOTE — Progress Notes (Signed)
PROGRESS NOTE    Danielle [name]  GYK:[id number] DOB: 09/23/1956 DOA: 10/14/2019 PCP: [facility]       Assessment & Plan:   Principal Problem:   Hyperkalemia Active Problems:   Moderate COPD (chronic obstructive pulmonary disease) (HCC)   AKI (acute kidney injury) (HCC)   Chronic systolic heart failure (HCC)   HTN (hypertension)   CKD (chronic kidney disease) stage 4, GFR 15-29 ml/min (HCC)   History of 2019 novel coronavirus disease (COVID-19)   AKI on CKD 4: etiology possibly related to ATN & recent hospitalization for COVID-19 pneumonia, as well as diuretic therapy. Cr is trending down today again. Nephro recs apprec  Likely ACD: likely secondary to CKD. S/p 1 unit of PRBCs on 10/17/19. H&H are labile. Fecal occult neg. Will continue to monitor   Metabolic acidosis: continue po dose of bicarb. Improving. Nephro recs apprec  Hx of ulcerative colitis: as per pt. Has had ostomy x approx 1 year as per pt. Likely the cause of ACD. Not on any meds for this currently   Elevated troponins: likely secondary to demand ischemia. Pt also has significant CKD. EKG shows no acute ischemic changes.  Hyperkalemia: secondary to ATN. Resolved  Likely PAF: Will continue on carvedilol. IV lopressor prn. S/p  IV digoxin x 1 on 10/14/19. Hold carvedilol/lopressor for MAP <65 and/or HR<65. Unknown why pt is not on anticoagulation, possible fall risk.  Hyponatremia: resolved  History of COVID19: symptomatic for residual weakness & sore throat. Chloraseptic spray prn. Repeat COVID19 test POSITIVE. Pt already completed a course of remdesivir so will not order. Pt is not currently requiring supplemental oxygen   Generalized weakness: PT/OT rec home health PT/OT. Home health orders placed  Chronic systolic heart failure: euvolemic.Continue carvedilol. Continue to hold ACE-I & lasix for AKI.  Monitor for fluid overload   HTN: continue home dose of carvedilol  Moderate COPD:  w/o exacerbation. Continue on proventil. Encourage incentive spirometry   Hx of ostomy: ostomy nurse following and recs apprec. Ostomy x approx 1 year secondary to ulcerative colitis as per pt    DVT prophylaxis: heparin Code Status: full Family Communication:  Disposition Plan:   Consultants:   nephro   Procedures:    Antimicrobials: n/a   Subjective: Pt c/o abd pain still   Objective: Vitals:   10/18/19 1538 10/18/19 2010 10/19/19 0411 10/19/19 0752  BP: 118/74 106/66 113/87 121/77  Pulse: 94 85 91 87  Resp: 18 16 18 19   Temp: 99.5 F (37.5 C) 99.2 F (37.3 C) 98.4 F (36.9 C) 98.4 F (36.9 C)  TempSrc: Oral Oral Oral Oral  SpO2: 99% 99% 99% 99%  Weight:      Height:        Intake/Output Summary (Last 24 hours) at 10/19/2019 0826 Last data filed at 10/19/2019 0603 Gross per 24 hour  Intake 1672.87 ml  Output 1150 ml  Net 522.87 ml   Filed Weights   10/13/19 2328 10/14/19 1622  Weight: 55.8 kg 51.6 kg    Examination:  General exam: Appears calm and comfortable  Respiratory system: diminished breath sounds b/l. No rales,wheezes. Respiratory effort normal. Cardiovascular system: S1 & S2 +. No  rubs, gallops or clicks. Gastrointestinal system: Abdomen is nondistended, soft and tenderness to palpation around ostomy site.  Normal bowel sounds heard. Central nervous system: Alert and oriented. Moves all 4 extremities  Psychiatry: Judgement and insight appear normal. Flat mood and affect      Data Reviewed: I  have personally reviewed following labs and imaging studies  CBC: Recent Labs  Lab 10/13/19 2345 10/15/19 0503 10/16/19 0348 10/17/19 4010 10/17/19 2000 10/18/19 0535 10/19/19 0608  WBC 7.2 5.9 5.7 5.7  --  6.4 6.3  NEUTROABS 5.7  --   --   --   --   --   --   HGB 9.9* 7.7* 7.4* 6.9* 8.6* 8.2* 7.9*  HCT 29.5* 22.8* 21.6* 20.4* 26.5* 24.5* 22.4*  MCV 92.8 91.6 91.9 94.0  --  92.8 87.8  PLT 261 228 230 229  --  248 272   Basic Metabolic  Panel: Recent Labs  Lab 10/15/19 0503 10/16/19 0348 10/17/19 0643 10/18/19 0535 10/19/19 0608  NA 136 138 136 141 139  K 5.1 3.9 4.1 3.4* 4.0  CL 115* 120* 119* 119* 114*  CO2 12* 10* 8* 16* 18*  GLUCOSE 79 105* 80 77 82  BUN 80* 58* 43* 36* 25*  CREATININE 3.26* 2.66* 2.22* 1.98* 1.75*  CALCIUM 9.6 9.2 8.6* 8.3* 7.8*  PHOS 4.5  --   --   --   --    GFR: Estimated Creatinine Clearance: 26.1 mL/min (A) (by C-G formula based on SCr of 1.75 mg/dL (H)). Liver Function Tests: Recent Labs  Lab 10/13/19 2345  AST 15  ALT 13  ALKPHOS 68  BILITOT 0.8  PROT 8.7*  ALBUMIN 4.2   No results for input(s): LIPASE, AMYLASE in the last 168 hours. No results for input(s): AMMONIA in the last 168 hours. Coagulation Profile: No results for input(s): INR, PROTIME in the last 168 hours. Cardiac Enzymes: No results for input(s): CKTOTAL, CKMB, CKMBINDEX, TROPONINI in the last 168 hours. BNP (last 3 results) No results for input(s): PROBNP in the last 8760 hours. HbA1C: No results for input(s): HGBA1C in the last 72 hours. CBG: Recent Labs  Lab 10/14/19 2023  GLUCAP 125*   Lipid Profile: No results for input(s): CHOL, HDL, LDLCALC, TRIG, CHOLHDL, LDLDIRECT in the last 72 hours. Thyroid Function Tests: No results for input(s): TSH, T4TOTAL, FREET4, T3FREE, THYROIDAB in the last 72 hours. Anemia Panel: No results for input(s): VITAMINB12, FOLATE, FERRITIN, TIBC, IRON, RETICCTPCT in the last 72 hours. Sepsis Labs: No results for input(s): PROCALCITON, LATICACIDVEN in the last 168 hours.  Recent Results (from the past 240 hour(s))  SARS CORONAVIRUS 2 (TAT 6-24 HRS) Nasopharyngeal Nasopharyngeal Swab     Status: Abnormal   Collection Time: 10/14/19  9:34 AM   Specimen: Nasopharyngeal Swab  Result Value Ref Range Status   SARS Coronavirus 2 POSITIVE (A) NEGATIVE Final    Comment: RESULT CALLED TO, READ BACK BY AND VERIFIED WITH: B.ALEJA RN 1710 10/14/2019 MCCORMICK K (NOTE)  SARS-CoV-2 target nucleic acids are DETECTED. The SARS-CoV-2 RNA is generally detectable in upper and lower respiratory specimens during the acute phase of infection. Positive results are indicative of the presence of SARS-CoV-2 RNA. Clinical correlation with patient history and other diagnostic information is  necessary to determine patient infection status. Positive results do not rule out bacterial infection or co-infection with other viruses.  The expected result is Negative. Fact Sheet for Patients: SugarRoll.be Fact Sheet for Healthcare Providers: https://www.woods-mathews.com/ This test is not yet approved or cleared by the [place] FDA and  has been authorized for detection and/or diagnosis of SARS-CoV-2 by FDA under an Emergency Use Authorization (EUA). This EUA will remain  in effect (meaning this test can be used) for  the duration of the COVID-19 declaration under Section 564(b)(1) of  the Act, 21 U.S.C. section 360bbb-3(b)(1), unless the authorization is terminated or revoked sooner. Performed at [facility], [place] [address] [place], [place] [address]          Radiology Studies: No results found.      Scheduled Meds: . carvedilol  3.125 mg Oral BID WC  . citalopram  40 mg Oral Daily  . feeding supplement (NEPRO CARB STEADY)  237 mL Oral TID BM  . heparin  5,000 Units Subcutaneous Q8H  . multivitamin with minerals  1 tablet Oral Daily  . pantoprazole  40 mg Oral BID  . sodium bicarbonate  1,300 mg Oral TID   Continuous Infusions: . sodium chloride 0.45 % 1,000 mL with potassium chloride 20 mEq, sodium bicarbonate 75 mEq infusion Stopped (10/19/19 0754)     LOS: 5 days    Time spent: 30 mins    Wyvonnia Dusky, MD [facility] Pager [phone]  If 7PM-7AM, please contact night-coverage [email or web address] Password [facility] 10/19/2019, 8:26 AM

## 2019-10-19 NOTE — Progress Notes (Signed)
Occupational Therapy Treatment Patient Details Name: Danielle [name] MRN: [id number] DOB: 22-Jul-1956 Today's Date: 10/19/2019    History of present illness 64 year old female admitted with hyperkalemia, reports of dizziness, back and abdominal pain.[name] Recently discharged Covid +. PMH includes: COPD, AKI, HF, HTN, CKD   OT comments  Pt seen for OT tx this date to f/u re: safety with self care and ADL mobility. Pt gets to EOB with Indep, sits with G static sitting balance, and completes colostomy mgt with setup only. Pt completes t/f's with CGA to SBA, slight unsteadiness, but no LOB. Pt completes LB dressing with setup and SBA with sit<>stand. Completes BSC stand pivot t/f with CGA. Pt does not appear to have further acute OT needs. No OT f/u appears to remain necessary after pt d/c from acute setting.    Follow Up Recommendations  No OT follow up    Equipment Recommendations  3 in 1 bedside commode    Recommendations for Other Services      Precautions / Restrictions Precautions Precautions: Fall Restrictions Weight Bearing Restrictions: No       Mobility Bed Mobility Overal bed mobility: Independent                Transfers Overall transfer level: Modified independent   Transfers: Sit to/from Stand Sit to Stand: Min guard              Balance Overall balance assessment: Modified Independent Sitting-balance support: Feet supported Sitting balance-Leahy Scale: Good     Standing balance support: During functional activity Standing balance-Leahy Scale: Good Standing balance comment: very faint unsteadiness requiring one hand support.                           ADL either performed or assessed with clinical judgement   ADL                                         General ADL Comments: Pt sits EOB with Indep and requires setup only to empty colostomy bag. Pt completes LB dressing with setup with sit<>stand from EOB and commode t/f  with CGA.     Vision Patient Visual Report: No change from baseline     Perception     Praxis      Cognition Arousal/Alertness: Awake/alert Behavior During Therapy: WFL for tasks assessed/performed Overall Cognitive Status: Within Functional Limits for tasks assessed                                          Exercises Other Exercises Other Exercises: OT engages pt in straight arm rasises and forward punches. Pt completes 16 reps (goal was 20) straight arm rasises before tiring and requiring rest break x1 min. completes 20 (10 each side) fwd punches using alternating method to modify/downgrade exercise to prevent fatigue.   Shoulder Instructions       General Comments      Pertinent Vitals/ Pain       Pain Assessment: No/denies pain  Home Living  Prior Functioning/Environment              Frequency           Progress Toward Goals  OT Goals(current goals can now be found in the care plan section)  Progress towards OT goals: Goals met/education completed, patient discharged from OT  Acute Rehab OT Goals Patient Stated Goal: to return home, feel better OT Goal Formulation: All assessment and education complete, DC therapy Potential to Achieve Goals: Good  Plan Discharge plan needs to be updated    Co-evaluation                 AM-PAC OT "6 Clicks" Daily Activity     Outcome Measure   Help from another person eating meals?: None Help from another person taking care of personal grooming?: None Help from another person toileting, which includes using toliet, bedpan, or urinal?: A Little Help from another person bathing (including washing, rinsing, drying)?: A Little Help from another person to put on and taking off regular upper body clothing?: None Help from another person to put on and taking off regular lower body clothing?: None 6 Click Score: 22    End of Session  Equipment Utilized During Treatment: Gait belt  OT Visit Diagnosis: Unsteadiness on feet (R26.81);Muscle weakness (generalized) (M62.81)   Activity Tolerance Patient tolerated treatment well   Patient Left in bed;with call bell/phone within reach   Nurse Communication Other (comment)(notified that pt requesting nausea medicine)        Time: 1638-4665 OT Time Calculation (min): 38 min  Charges: OT General Charges $OT Visit: 1 Visit OT Treatments $Self Care/Home Management : 23-37 mins $Therapeutic Exercise: 8-22 mins   Gerrianne Scale, [place], OTR/L ascom [phone] 10/19/19, 5:09 PM

## 2019-10-19 NOTE — Progress Notes (Signed)
Nutrition Follow Up Note   DOCUMENTATION CODES:   Not applicable  INTERVENTION:   Nepro Shake po TID, each supplement provides 425 kcal and 19 grams protein  MVI daily   NUTRITION DIAGNOSIS:   Increased nutrient needs related to catabolic illness(COVID 19) as evidenced by increased estimated needs.  GOAL:   Patient will meet greater than or equal to 90% of their needs  -progressing   MONITOR:   PO intake, Supplement acceptance, Labs, Weight trends, Skin, I & O's  ASSESSMENT:   64 y.o. 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 Pt now admitted with hyperkalemia and COVID 19   Pt with improved appetite and oral intake; pt eating 50-75% of meals and drinking the Nepro. Recommend continue supplements after discharge. No weight since admit; will request daily weights.    Medications reviewed and include: celexa, heparin, MVI, protonix, Na Bicarbonate   Labs reviewed: K 4.0 wnl, BUN 25(H), creat 1.75(H) Hgb 7.9(L), Hct 22.4(L)  Diet Order:   Diet Order            Diet regular Room service appropriate? Yes; Fluid consistency: Thin  Diet effective now             EDUCATION NEEDS:   Not appropriate for education at this time  Skin:  Skin Assessment: Reviewed RN Assessment  Last BM:  1/3- type 7 via ostomy  Height:   Ht Readings from Last 1 Encounters:  10/14/19 5\' 6"  (1.676 m)    Weight:   Wt Readings from Last 1 Encounters:  10/14/19 51.6 kg    Ideal Body Weight:  59 kg  BMI:  Body mass index is 18.35 kg/m.  Estimated Nutritional Needs:   Kcal:   1500-1700kcal/day  Protein:  75-85g/day  Fluid:  >1.4L/day  [name] MS, RD, LDN Pager #- [phone] Office#- [phone] After Hours Pager: [phone]

## 2019-10-19 NOTE — Progress Notes (Signed)
[facility]  ROUNDING NOTE   Subjective:  Case discussed with nursing. Patient appears to have a good appetite. Creatinine currently 1.75 with an EGFR of 35.  Objective:  Vital signs in last 24 hours:  Temp:  [98.4 F (36.9 C)-99.5 F (37.5 C)] 98.4 F (36.9 C) (01/04 0752) Pulse Rate:  [85-94] 87 (01/04 0752) Resp:  [16-19] 19 (01/04 0752) BP: (106-121)/(66-87) 121/77 (01/04 0752) SpO2:  [99 %] 99 % (01/04 0752)  Weight change:  Filed Weights   10/13/19 2328 10/14/19 1622  Weight: 55.8 kg 51.6 kg    Intake/Output: I/O last 3 completed shifts: In: 1672.9 [P.O.:240; I.V.:1432.9] Out: 1750 [Urine:800; Stool:950]   Intake/Output this shift:  Total I/O In: -  Out: 300 [Stool:300]  Physical Exam: Exam deferred in an effort to limit spread of COVID-19 infection and preserve PPE.  Basic Metabolic Panel: Recent Labs  Lab 10/15/19 0503 10/16/19 0348 10/17/19 0643 10/18/19 0535 10/19/19 0608  NA 136 138 136 141 139  K 5.1 3.9 4.1 3.4* 4.0  CL 115* 120* 119* 119* 114*  CO2 12* 10* 8* 16* 18*  GLUCOSE 79 105* 80 77 82  BUN 80* 58* 43* 36* 25*  CREATININE 3.26* 2.66* 2.22* 1.98* 1.75*  CALCIUM 9.6 9.2 8.6* 8.3* 7.8*  PHOS 4.5  --   --   --   --     Liver Function Tests: Recent Labs  Lab 10/13/19 2345  AST 15  ALT 13  ALKPHOS 68  BILITOT 0.8  PROT 8.7*  ALBUMIN 4.2   No results for input(s): LIPASE, AMYLASE in the last 168 hours. No results for input(s): AMMONIA in the last 168 hours.  CBC: Recent Labs  Lab 10/13/19 2345 10/15/19 0503 10/16/19 0348 10/17/19 0643 10/17/19 2000 10/18/19 0535 10/19/19 0608  WBC 7.2 5.9 5.7 5.7  --  6.4 6.3  NEUTROABS 5.7  --   --   --   --   --   --   HGB 9.9* 7.7* 7.4* 6.9* 8.6* 8.2* 7.9*  HCT 29.5* 22.8* 21.6* 20.4* 26.5* 24.5* 22.4*  MCV 92.8 91.6 91.9 94.0  --  92.8 87.8  PLT 261 228 230 229  --  248 229    Cardiac Enzymes: No results for input(s): CKTOTAL, CKMB, CKMBINDEX, TROPONINI in the  last 168 hours.  BNP: Invalid input(s): POCBNP  CBG: Recent Labs  Lab 10/14/19 2023  GLUCAP 125*    Microbiology: Results for orders placed or performed during the hospital encounter of 10/14/19  SARS CORONAVIRUS 2 (TAT 6-24 HRS) Nasopharyngeal Nasopharyngeal Swab     Status: Abnormal   Collection Time: 10/14/19  9:34 AM   Specimen: Nasopharyngeal Swab  Result Value Ref Range Status   SARS Coronavirus 2 POSITIVE (A) NEGATIVE Final    Comment: RESULT CALLED TO, READ BACK BY AND VERIFIED WITH: B.ALEJA RN 9983 10/14/2019 MCCORMICK K (NOTE) SARS-CoV-2 target nucleic acids are DETECTED. The SARS-CoV-2 RNA is generally detectable in upper and lower respiratory specimens during the acute phase of infection. Positive results are indicative of the presence of SARS-CoV-2 RNA. Clinical correlation with patient history and other diagnostic information is  necessary to determine patient infection status. Positive results do not rule out bacterial infection or co-infection with other viruses.  The expected result is Negative. Fact Sheet for Patients: SugarRoll.be Fact Sheet for Healthcare Providers: https://www.woods-mathews.com/ This test is not yet approved or cleared by the [place] FDA and  has been authorized for detection and/or diagnosis of SARS-CoV-2  by FDA under an Emergency Use Authorization (EUA). This EUA will remain  in effect (meaning this test can be used) for  the duration of the COVID-19 declaration under Section 564(b)(1) of the Act, 21 U.S.C. section 360bbb-3(b)(1), unless the authorization is terminated or revoked sooner. Performed at [facility], [place] [address] [place], [place] [address]     Coagulation Studies: No results for input(s): LABPROT, INR in the last 72 hours.  Urinalysis: No results for input(s): COLORURINE, LABSPEC, PHURINE, GLUCOSEU, HGBUR, BILIRUBINUR, KETONESUR, PROTEINUR, UROBILINOGEN,  NITRITE, LEUKOCYTESUR in the last 72 hours.  Invalid input(s): APPERANCEUR    Imaging: No results found.   Medications:    . carvedilol  3.125 mg Oral BID WC  . citalopram  40 mg Oral Daily  . feeding supplement (NEPRO CARB STEADY)  237 mL Oral TID BM  . heparin  5,000 Units Subcutaneous Q8H  . multivitamin with minerals  1 tablet Oral Daily  . pantoprazole  40 mg Oral BID  . sodium bicarbonate  1,300 mg Oral TID   acetaminophen **OR** acetaminophen, albuterol, loperamide, metoprolol tartrate, ondansetron **OR** ondansetron (ZOFRAN) IV, phenol  Assessment/ Plan:  64 y.o. female with past medical history of COPD, congestive heart failure, lymphedema, history of breast cancer treated with mastectomy, chemoand radiation,history of esophageal cancer, ischemic colitis with history of hemicolectomy and ileostomy, hypertension and diabetes who was admitted on 12/30/2020with acute renal failure, abdominal pain.  1.  Acute kidney injury/chronic kidney disease stage IIIb.  The patient's renal function is improving.  Creatinine down to 1.75 with an EGFR 35.  No indication for dialysis.  Continue to monitor renal parameters closely.  2.  Hypertension.  Maintain the patient on carvedilol 3.125 mg p.o. twice daily.  3.  Metabolic acidosis.  Serum bicarbonate rising and currently 18.  Maintain the patient on sodium bicarbonate 1300 mg p.o. 3 times daily.  4.  Anemia of chronic kidney disease.  Hemoglobin up to 7.9.  Has been as low as 6.9 during this admission.  May need to consider Epogen as an outpatient.   LOS: 5 Starlet Gallentine 1/4/20211:49 PM

## 2019-10-19 NOTE — TOC Progression Note (Signed)
Transition of Care [facility]) - Progression Note    Patient Details  Name: Danielle [name] MRN: [id number] Date of Birth: Feb 17, 1956  Transition of Care [facility]) CM/SW [place], LCSW Phone Number: 10/19/2019, 3:45 PM  Clinical Narrative: [facility], and [facility] declined on Friday due to staff. [facility] declined today. Left messages for [place] [place], and [place].   Expected Discharge Plan: [place] Barriers to Discharge: Continued Medical Work up  Expected Discharge Plan and Services Expected Discharge Plan: [place]   Discharge Planning Services: CM Consult   Living arrangements for the past 2 months: Single Family Home                                       Social Determinants of Health (SDOH) Interventions    Readmission Risk Interventions Readmission Risk Prevention Plan 10/16/2019 09/28/2019 06/20/2019  Transportation Screening Complete Complete Complete  PCP or Specialist Appt within 3-5 Days - - -  [place] or [place] Work Consult for [place] - - -  Medication Review (RN Care Manager) Referral to Pharmacy Complete Complete  [facility] or [place] Complete Patient refused Complete  SW Recovery Care/Counseling Consult Complete Complete -  Palliative Care Screening Not Applicable Not Applicable Not Applicable  Skilled Nursing Facility Not Applicable Not Applicable -  Some recent data might be hidden

## 2019-10-20 DIAGNOSIS — D649 Anemia, unspecified: Secondary | ICD-10-CM

## 2019-10-20 LAB — CBC
HCT: 22.7 % — ABNORMAL LOW (ref 36.0–46.0)
Hemoglobin: 7.4 g/dL — ABNORMAL LOW (ref 12.0–15.0)
MCH: 30.5 pg (ref 26.0–34.0)
MCHC: 32.6 g/dL (ref 30.0–36.0)
MCV: 93.4 fL (ref 80.0–100.0)
Platelets: 265 10*3/uL (ref 150–400)
RBC: 2.43 MIL/uL — ABNORMAL LOW (ref 3.87–5.11)
RDW: 15.9 % — ABNORMAL HIGH (ref 11.5–15.5)
WBC: 6.3 10*3/uL (ref 4.0–10.5)
nRBC: 0 % (ref 0.0–0.2)

## 2019-10-20 LAB — BASIC METABOLIC PANEL
Anion gap: 7 (ref 5–15)
BUN: 24 mg/dL — ABNORMAL HIGH (ref 8–23)
CO2: 21 mmol/L — ABNORMAL LOW (ref 22–32)
Calcium: 7.8 mg/dL — ABNORMAL LOW (ref 8.9–10.3)
Chloride: 111 mmol/L (ref 98–111)
Creatinine, Ser: 1.8 mg/dL — ABNORMAL HIGH (ref 0.44–1.00)
GFR calc Af Amer: 34 mL/min — ABNORMAL LOW (ref >60)
GFR calc non Af Amer: 29 mL/min — ABNORMAL LOW (ref >60)
Glucose, Bld: 77 mg/dL (ref 70–99)
Potassium: 4.3 mmol/L (ref 3.5–5.1)
Sodium: 139 mmol/L (ref 135–145)

## 2019-10-20 LAB — OCCULT BLOOD X 1 CARD TO LAB, STOOL: Fecal Occult Bld: POSITIVE — AB

## 2019-10-20 MED ORDER — SODIUM CHLORIDE 0.9% FLUSH
10.0000 mL | Freq: Two times a day (BID) | INTRAVENOUS | Status: DC
  Administered 2019-10-20 – 2019-10-21 (×2): 10 mL via INTRAVENOUS

## 2019-10-20 MED ORDER — FLUTICASONE FUROATE-VILANTEROL 100-25 MCG/INH IN AEPB
1.0000 | INHALATION_SPRAY | Freq: Every day | RESPIRATORY_TRACT | Status: DC
  Administered 2019-10-20 – 2019-10-21 (×2): 1 via RESPIRATORY_TRACT
  Filled 2019-10-20: qty 28

## 2019-10-20 NOTE — Progress Notes (Signed)
PROGRESS NOTE    Danielle [name]  WFU:[id number] DOB: 1956-07-13 DOA: 10/14/2019 PCP: [facility]   HPI taken from Dr. Damita Dunnings: Danielle [name] is a 64 y.o. female with medical history significant for  Breast, lung and esophagus, history of systolic heart failure, COPD, hypertension, CKD 4, recently hospitalized for COVID-19 pneumonia with respiratory failure on 09/27/19, who presents to the emergency room with a several day history of persistent dizziness as well as generalized abdominal and back pain.  She denies chest pain, nausea vomiting or change in bowel habits.  Denies cough and shortness of breath   ED Course: On arrival in the emergency room, she was afebrile with temperature of 98.3 blood pressure is 130/86 heart rate 80 respirations 18 with O2 sat of 99% on room air.  Her blood work was significant for sodium of 126 and potassium of 7.2.  Fattening was elevated at 4.83 above her baseline of 2.2.  hemoglobin 9.9.  White cell count 7.2.  Troponin was 22.  KG showed normal sinus rhythm.  Received calcium gluconate, insulin with D50, sodium bicarb as well as Veltassa in the emergency room and hospitalist service was consulted for admission.  Hospital Course from Dr. Lenise Herald 10/14/19-10/20/19: Pt was found to have hyperkalemia likely secondary to AKI on CKD. Pt was given veltassa, ca gluconate, insulin, dextrose, bicarb and the hyperkalemia resolved. Cr has been labile and nephro has been following the pt. Furthermore, pt c/o abd pain intermittently. Pt has an ostomy x approx 1 year secondary ulcerative colitis. Ostomy nurse has been seeing the pt as well. Pt is not on any meds for ulcerative colitis at this time. GI was consulted for acute blood loss anemia, fecal occult positive in setting of hx of ulcerative colitis. Pt did receive 1 unit of PRBCs so far while in the hospital. Of note, pt previously tested positive for COVID19 and test positive again this admission for  COVID19. Pt was already received remdesivir so it was not re-ordered. Pt has not required supplemental oxygen but c/o sore throat and weakness still.     Assessment & Plan:   Principal Problem:   Hyperkalemia Active Problems:   Moderate COPD (chronic obstructive pulmonary disease) (HCC)   AKI (acute kidney injury) (HCC)   Chronic systolic heart failure (HCC)   HTN (hypertension)   CKD (chronic kidney disease) stage 4, GFR 15-29 ml/min (HCC)   History of 2019 novel coronavirus disease (COVID-19)   AKI on CKD 4: etiology possibly related to ATN & recent hospitalization for COVID-19 pneumonia, as well as diuretic therapy. Cr is labile. Nephro recs apprec  Likely ACD w/ acute blood loss anemia: likely secondary to CKD & ulcerative colitis. S/p 1 unit of PRBCs on 10/17/19. H&H are labile. Fecal occult positive today & neg yesterday. GI consulted. May need epo as an outpatient as per nephro. Will continue to monitor   Metabolic acidosis: continue po dose of bicarb. Improving. Nephro recs apprec  Hx of ulcerative colitis: as per pt. Has had ostomy x approx 1 year as per pt. Likely the cause of ACD. Not on any meds for this currently. GI consulted  Elevated troponins: likely secondary to demand ischemia. Pt also has significant CKD. EKG shows no acute ischemic changes.  Hyperkalemia: secondary to ATN. Resolved  Likely PAF: Will continue on carvedilol. IV lopressor prn. S/p  IV digoxin x 1 on 10/14/19. Hold carvedilol/lopressor for MAP <65 and/or HR<65. Unknown why pt is not on anticoagulation, possible  fall risk.  Hyponatremia: resolved  History of COVID19: symptomatic for residual weakness & sore throat. Chloraseptic spray prn. Repeat COVID19 test POSITIVE. Pt already completed a course of remdesivir so will not order. Pt is not currently requiring supplemental oxygen   Generalized weakness: PT/OT rec home health PT/OT. Home health orders placed  Chronic systolic heart failure:  euvolemic.Continue carvedilol. Continue to hold ACE-I & lasix for AKI.  Monitor for fluid overload   HTN: continue home dose of carvedilol  Moderate COPD: w/o exacerbation. Continue on proventil. Encourage incentive spirometry   Hx of ostomy: ostomy nurse following and recs apprec. Ostomy x approx 1 year secondary to ulcerative colitis as per pt    DVT prophylaxis: heparin Code Status: full Family Communication:  Disposition Plan:   Consultants:   Nephro, GI   Procedures:    Antimicrobials: n/a   Subjective: Pt c/o fatigue  Objective: Vitals:   10/19/19 1536 10/19/19 2028 10/20/19 0457 10/20/19 0750  BP: 111/84 113/76 114/79 112/74  Pulse: 92 86 86 86  Resp: 20 18 18 19   Temp: 99.1 F (37.3 C) 98.3 F (36.8 C) 98.3 F (36.8 C) 99.2 F (37.3 C)  TempSrc: Oral Oral Oral Oral  SpO2: 98% 98% 96% 98%  Weight:   57.5 kg   Height:        Intake/Output Summary (Last 24 hours) at 10/20/2019 0826 Last data filed at 10/20/2019 0500 Gross per 24 hour  Intake -  Output 775 ml  Net -775 ml   Filed Weights   10/13/19 2328 10/14/19 1622 10/20/19 0457  Weight: 55.8 kg 51.6 kg 57.5 kg    Examination:  General exam: Appears calm and comfortable  Respiratory system: decreased breath sounds b/l. No rales,wheezes. Respiratory effort normal. Cardiovascular system: S1 & S2 +. No  rubs, gallops or clicks. Gastrointestinal system: Abdomen is nondistended, soft and tenderness to palpation around ostomy site. Normal bowel sounds heard. Central nervous system: Alert and oriented. Moves all 4 extremities  Psychiatry: Judgement and insight appear normal. Flat mood and affect      Data Reviewed: I have personally reviewed following labs and imaging studies  CBC: Recent Labs  Lab 10/13/19 2345 10/16/19 0348 10/17/19 0643 10/17/19 2000 10/18/19 0535 10/19/19 0608 10/20/19 0554  WBC 7.2 5.7 5.7  --  6.4 6.3 6.3  NEUTROABS 5.7  --   --   --   --   --   --   HGB 9.9* 7.4*  6.9* 8.6* 8.2* 7.9* 7.4*  HCT 29.5* 21.6* 20.4* 26.5* 24.5* 22.4* 22.7*  MCV 92.8 91.9 94.0  --  92.8 87.8 93.4  PLT 261 230 229  --  248 229 578   Basic Metabolic Panel: Recent Labs  Lab 10/15/19 0503 10/16/19 0348 10/17/19 0643 10/18/19 0535 10/19/19 0608 10/20/19 0554  NA 136 138 136 141 139 139  K 5.1 3.9 4.1 3.4* 4.0 4.3  CL 115* 120* 119* 119* 114* 111  CO2 12* 10* 8* 16* 18* 21*  GLUCOSE 79 105* 80 77 82 77  BUN 80* 58* 43* 36* 25* 24*  CREATININE 3.26* 2.66* 2.22* 1.98* 1.75* 1.80*  CALCIUM 9.6 9.2 8.6* 8.3* 7.8* 7.8*  PHOS 4.5  --   --   --   --   --    GFR: Estimated Creatinine Clearance: 28.3 mL/min (A) (by C-G formula based on SCr of 1.8 mg/dL (H)). Liver Function Tests: Recent Labs  Lab 10/13/19 2345  AST 15  ALT 13  ALKPHOS 68  BILITOT 0.8  PROT 8.7*  ALBUMIN 4.2   No results for input(s): LIPASE, AMYLASE in the last 168 hours. No results for input(s): AMMONIA in the last 168 hours. Coagulation Profile: No results for input(s): INR, PROTIME in the last 168 hours. Cardiac Enzymes: No results for input(s): CKTOTAL, CKMB, CKMBINDEX, TROPONINI in the last 168 hours. BNP (last 3 results) No results for input(s): PROBNP in the last 8760 hours. HbA1C: No results for input(s): HGBA1C in the last 72 hours. CBG: Recent Labs  Lab 10/14/19 2023  GLUCAP 125*   Lipid Profile: No results for input(s): CHOL, HDL, LDLCALC, TRIG, CHOLHDL, LDLDIRECT in the last 72 hours. Thyroid Function Tests: No results for input(s): TSH, T4TOTAL, FREET4, T3FREE, THYROIDAB in the last 72 hours. Anemia Panel: No results for input(s): VITAMINB12, FOLATE, FERRITIN, TIBC, IRON, RETICCTPCT in the last 72 hours. Sepsis Labs: No results for input(s): PROCALCITON, LATICACIDVEN in the last 168 hours.  Recent Results (from the past 240 hour(s))  SARS CORONAVIRUS 2 (TAT 6-24 HRS) Nasopharyngeal Nasopharyngeal Swab     Status: Abnormal   Collection Time: 10/14/19  9:34 AM   Specimen:  Nasopharyngeal Swab  Result Value Ref Range Status   SARS Coronavirus 2 POSITIVE (A) NEGATIVE Final    Comment: RESULT CALLED TO, READ BACK BY AND VERIFIED WITH: B.ALEJA RN 1710 10/14/2019 MCCORMICK K (NOTE) SARS-CoV-2 target nucleic acids are DETECTED. The SARS-CoV-2 RNA is generally detectable in upper and lower respiratory specimens during the acute phase of infection. Positive results are indicative of the presence of SARS-CoV-2 RNA. Clinical correlation with patient history and other diagnostic information is  necessary to determine patient infection status. Positive results do not rule out bacterial infection or co-infection with other viruses.  The expected result is Negative. Fact Sheet for Patients: SugarRoll.be Fact Sheet for Healthcare Providers: https://www.woods-mathews.com/ This test is not yet approved or cleared by the [place] FDA and  has been authorized for detection and/or diagnosis of SARS-CoV-2 by FDA under an Emergency Use Authorization (EUA). This EUA will remain  in effect (meaning this test can be used) for  the duration of the COVID-19 declaration under Section 564(b)(1) of the Act, 21 U.S.C. section 360bbb-3(b)(1), unless the authorization is terminated or revoked sooner. Performed at [facility], [place] [address], [place], [place] [address]          Radiology Studies: No results found.      Scheduled Meds: . carvedilol  3.125 mg Oral BID WC  . citalopram  40 mg Oral Daily  . feeding supplement (NEPRO CARB STEADY)  237 mL Oral TID BM  . heparin  5,000 Units Subcutaneous Q8H  . multivitamin with minerals  1 tablet Oral Daily  . pantoprazole  40 mg Oral BID  . sodium bicarbonate  1,300 mg Oral TID   Continuous Infusions:    LOS: 6 days    Time spent: 32 mins    Wyvonnia Dusky, MD [facility] Pager [phone]  If 7PM-7AM, please contact night-coverage [email or web address]  Password [facility] 10/20/2019, 8:26 AM

## 2019-10-20 NOTE — Progress Notes (Signed)
[facility]  ROUNDING NOTE   Subjective:  Renal function appears to be relatively stable. Creatinine currently 1.80 with a BUN of 24. Hemoglobin has drifted down slightly to 7.4.  Objective:  Vital signs in last 24 hours:  Temp:  [98.3 F (36.8 C)-99.2 F (37.3 C)] 99.2 F (37.3 C) (01/05 0750) Pulse Rate:  [86-92] 86 (01/05 0750) Resp:  [18-20] 19 (01/05 0750) BP: (111-114)/(74-84) 112/74 (01/05 0750) SpO2:  [96 %-98 %] 98 % (01/05 0750) Weight:  [57.5 kg] 57.5 kg (01/05 0457)  Weight change:  Filed Weights   10/13/19 2328 10/14/19 1622 10/20/19 0457  Weight: 55.8 kg 51.6 kg 57.5 kg    Intake/Output: I/O last 3 completed shifts: In: 1291.2 [P.O.:240; I.V.:1051.2] Out: 1550 [Urine:200; UEAVW:0981]   Intake/Output this shift:  No intake/output data recorded.  Physical Exam: Exam deferred in an effort to limit spread of COVID-19 infection and preserve PPE.  Basic Metabolic Panel: Recent Labs  Lab 10/15/19 0503 10/16/19 0348 10/17/19 0643 10/18/19 0535 10/19/19 0608 10/20/19 0554  NA 136 138 136 141 139 139  K 5.1 3.9 4.1 3.4* 4.0 4.3  CL 115* 120* 119* 119* 114* 111  CO2 12* 10* 8* 16* 18* 21*  GLUCOSE 79 105* 80 77 82 77  BUN 80* 58* 43* 36* 25* 24*  CREATININE 3.26* 2.66* 2.22* 1.98* 1.75* 1.80*  CALCIUM 9.6 9.2 8.6* 8.3* 7.8* 7.8*  PHOS 4.5  --   --   --   --   --     Liver Function Tests: Recent Labs  Lab 10/13/19 2345  AST 15  ALT 13  ALKPHOS 68  BILITOT 0.8  PROT 8.7*  ALBUMIN 4.2   No results for input(s): LIPASE, AMYLASE in the last 168 hours. No results for input(s): AMMONIA in the last 168 hours.  CBC: Recent Labs  Lab 10/13/19 2345 10/16/19 0348 10/17/19 0643 10/17/19 2000 10/18/19 0535 10/19/19 0608 10/20/19 0554  WBC 7.2 5.7 5.7  --  6.4 6.3 6.3  NEUTROABS 5.7  --   --   --   --   --   --   HGB 9.9* 7.4* 6.9* 8.6* 8.2* 7.9* 7.4*  HCT 29.5* 21.6* 20.4* 26.5* 24.5* 22.4* 22.7*  MCV 92.8 91.9 94.0  --  92.8  87.8 93.4  PLT 261 230 229  --  248 229 265    Cardiac Enzymes: No results for input(s): CKTOTAL, CKMB, CKMBINDEX, TROPONINI in the last 168 hours.  BNP: Invalid input(s): POCBNP  CBG: Recent Labs  Lab 10/14/19 2023  GLUCAP 125*    Microbiology: Results for orders placed or performed during the hospital encounter of 10/14/19  SARS CORONAVIRUS 2 (TAT 6-24 HRS) Nasopharyngeal Nasopharyngeal Swab     Status: Abnormal   Collection Time: 10/14/19  9:34 AM   Specimen: Nasopharyngeal Swab  Result Value Ref Range Status   SARS Coronavirus 2 POSITIVE (A) NEGATIVE Final    Comment: RESULT CALLED TO, READ BACK BY AND VERIFIED WITH: B.ALEJA RN 1914 10/14/2019 MCCORMICK K (NOTE) SARS-CoV-2 target nucleic acids are DETECTED. The SARS-CoV-2 RNA is generally detectable in upper and lower respiratory specimens during the acute phase of infection. Positive results are indicative of the presence of SARS-CoV-2 RNA. Clinical correlation with patient history and other diagnostic information is  necessary to determine patient infection status. Positive results do not rule out bacterial infection or co-infection with other viruses.  The expected result is Negative. Fact Sheet for Patients: SugarRoll.be Fact Sheet for Healthcare Providers:  https://www.woods-mathews.com/ This test is not yet approved or cleared by the [place] and  has been authorized for detection and/or diagnosis of SARS-CoV-2 by FDA under an Emergency Use Authorization (EUA). This EUA will remain  in effect (meaning this test can be used) for  the duration of the COVID-19 declaration under Section 564(b)(1) of the Act, 21 U.S.C. section 360bbb-3(b)(1), unless the authorization is terminated or revoked sooner. Performed at [facility], [place] [address] [place], [place] [address]     Coagulation Studies: No results for input(s): LABPROT, INR in the last 72  hours.  Urinalysis: No results for input(s): COLORURINE, LABSPEC, PHURINE, GLUCOSEU, HGBUR, BILIRUBINUR, KETONESUR, PROTEINUR, UROBILINOGEN, NITRITE, LEUKOCYTESUR in the last 72 hours.  Invalid input(s): APPERANCEUR    Imaging: No results found.   Medications:    . carvedilol  3.125 mg Oral BID WC  . citalopram  40 mg Oral Daily  . feeding supplement (NEPRO CARB STEADY)  237 mL Oral TID BM  . fluticasone furoate-vilanterol  1 puff Inhalation Daily  . heparin  5,000 Units Subcutaneous Q8H  . multivitamin with minerals  1 tablet Oral Daily  . pantoprazole  40 mg Oral BID  . sodium bicarbonate  1,300 mg Oral TID   acetaminophen **OR** acetaminophen, albuterol, loperamide, metoprolol tartrate, ondansetron **OR** ondansetron (ZOFRAN) IV, phenol  Assessment/ Plan:  64 y.o. female with past medical history of COPD, congestive heart failure, lymphedema, history of breast cancer treated with mastectomy, chemoand radiation,history of esophageal cancer, ischemic colitis with history of hemicolectomy and ileostomy, hypertension and diabetes who was admitted on 12/30/2020with acute renal failure, abdominal pain.  1.  Acute kidney injury/chronic kidney disease stage IIIb.  Renal function relatively stable over the past several days.  Creatinine currently 1.8 with an EGFR of 34.  Continue to periodically monitor renal function.  2.  Hypertension.  Continue current dosage of carvedilol.  3.  Metabolic acidosis.  Serum bicarbonate improved to 21.  Maintain the patient on sodium bicarbonate 1300 mg p.o. 3 times daily.  4.  Anemia of chronic kidney disease.  Hemoglobin currently 7.4.  As before consider Epogen as an outpatient..   LOS: 6 Ritesh Opara 1/5/202112:18 PM

## 2019-10-20 NOTE — Progress Notes (Signed)
PT Cancellation Note  Patient Details Name: Danielle [name] MRN: [id number] DOB: 08-29-1956   Cancelled Treatment:    Reason Eval/Treat Not Completed: Other (comment)(PT and RN discussed patient this AM, of note recently placed on O2 due to SOB complaints this AM. Hemoglobin also 7.4, RN aware and requested PT to hold pt for now. PT to follow up as able.)  Lieutenant Diego PT, DPT 10:36 AM,10/20/19

## 2019-10-20 NOTE — Care Management Important Message (Signed)
Important Message  Patient Details  Name: Danielle [name] MRN: [id number] Date of Birth: 10-26-1955   Medicare Important Message Given:  Yes  Reviewed with patient over room phone due to isolation status.  Requested copy of Medicare IM be brought to her room.  Will leave copy with unit secretary for staff to bring into room next time someone goes in.     [name] 10/20/2019, 10:44 AM

## 2019-10-20 NOTE — Consult Note (Signed)
[name], MD [address], [place], [place], [place], [address], [place], [place], [place], [address] Phone: [phone]  Fax: [phone]  Consultation  Referring Provider:     Dr. Jimmye Norman Primary Care Physician:  [facility] Reason for Consultation:    Anemia Primary gastroenterologist: Dr. Vicente Males  Date of Admission:  10/14/2019 Date of Consultation:  10/20/2019         HPI:   Danielle [name] is a 64 y.o. 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  Previous diagnosis has been anemia of chronic disease.  Patient denies any episodes of bleeding.  No blood via ileostomy back or mucous fistula site.  No other sources of bleeding.  Patient had a fecal occult blood test that was done that was positive, however, it was collected from stool from the ileostomy.  Follows with Dr. Rogue Bussing of hematology oncology.  His last note reports worsening macrocytic anemia as of September 2020 and bone marrow biopsies were done.  "MDS is still a possibility, if anemia worsens would recommend peripheral blood NGS."  His assessment and plan also questions active multiple myeloma given the severe anemia, and " discussed that if progressive anemia noted then trial of dexamethasone/Velcade could be considered"  Recent ferritin and iron labs do not reveal iron deficiency in December 2020  Patient was recently discharged on 10/02/2019, after treatment for positive Covid.  Previous history from previous notes: Upper endoscopy, July 2020, see procedure reports.  Reported erythematous, granular appearing tissue in the distal esophagus from 31 to 37 cm  An  ileoscopy was also scheduled due to anemia in October 2019.  Ileoscopy could not be done as the ileostomy site was not amenable to insertion of the scope.  Recommendations were to have surgery we look at this to see if it needs to be revised.  Evaluation of the transverse colon was done through the mucous fistula and did not show any evidence of bleeding.    Past Medical History:  Diagnosis Date  . Anxiety   . Arthritis   . Breast cancer ([place]) 2000  . Cancer ([place]) left    breast cancer 2000, chemo tx's with total mastectomy and lymph nodes resected.   . Cancer of right lung ([place]) 02/21/2016   rad tx's.   . CHF (congestive heart failure) ([place])   . COPD (chronic obstructive pulmonary disease) ([place])   . Dependence on supplemental oxygen   . Depression   . Heart murmur   . Hypertension   . Lung nodule   . Lymphedema   . Personal history of chemotherapy   . Personal history of radiation therapy   . Shortness of breath dyspnea    with exertion  . Status post chemotherapy 2001   left breast cancer  . Status post radiation therapy 2001   left breast cancer    Past Surgical History:  Procedure Laterality Date  . Breast Biospy Left    [facility]  . BREAST SURGERY    . COLONOSCOPY N/A 04/30/2018   Procedure: COLONOSCOPY;  Surgeon: Virgel Manifold, MD;  Location: [facility] ENDOSCOPY;  Service: Endoscopy;  Laterality: N/A;  . COLONOSCOPY N/A 07/22/2018   Procedure: COLONOSCOPY;  Surgeon: Virgel Manifold, MD;  Location: [facility] ENDOSCOPY;  Service: Endoscopy;  Laterality: N/A;  . DILATION AND CURETTAGE OF UTERUS    . ELECTROMAGNETIC NAVIGATION BROCHOSCOPY Right 04/11/2016   Procedure: ELECTROMAGNETIC NAVIGATION BRONCHOSCOPY;  Surgeon: Vilinda Boehringer, MD;  Location: [facility] ORS;  Service: Cardiopulmonary;  Laterality: Right;  . ESOPHAGOGASTRODUODENOSCOPY N/A 07/22/2018   Procedure: ESOPHAGOGASTRODUODENOSCOPY (EGD);  Surgeon: Virgel Manifold, MD;  Location: [facility] ENDOSCOPY;  Service: Endoscopy;   Laterality: N/A;  . ESOPHAGOGASTRODUODENOSCOPY (EGD) WITH PROPOFOL N/A 05/07/2018   Procedure: ESOPHAGOGASTRODUODENOSCOPY (EGD) WITH PROPOFOL;  Surgeon: Lucilla Lame, MD;  Location: [facility] ENDOSCOPY;  Service: Endoscopy;  Laterality: N/A;  . ESOPHAGOGASTRODUODENOSCOPY (EGD) WITH PROPOFOL N/A 04/24/2019   Procedure: ESOPHAGOGASTRODUODENOSCOPY (EGD) WITH PROPOFOL;  Surgeon: Jonathon Bellows, MD;  Location: [facility] ENDOSCOPY;  Service: Gastroenterology;  Laterality: N/A;  . EUS N/A 05/07/2019   Procedure: FULL UPPER ENDOSCOPIC ULTRASOUND (EUS) RADIAL;  Surgeon: Jola Schmidt, MD;  Location: [facility] ENDOSCOPY;  Service: Endoscopy;  Laterality: N/A;  . history of colonoscopy]    . ILEOSCOPY N/A 07/22/2018   Procedure: ILEOSCOPY THROUGH STOMA;  Surgeon: Virgel Manifold, MD;  Location: [facility] ENDOSCOPY;  Service: Endoscopy;  Laterality: N/A;  . ILEOSTOMY    . ILEOSTOMY N/A 09/08/2018   Procedure: ILEOSTOMY REVISION POSSIBLE CREATION;  Surgeon: Herbert Pun, MD;  Location: [facility] ORS;  Service: General;  Laterality: N/A;  . ILEOSTOMY CLOSURE N/A 08/15/2018   Procedure: DILATION OF ILEOSTOMY STRICTURE;  Surgeon: Herbert Pun, MD;  Location: [facility] ORS;  Service: General;  Laterality: N/A;  . LAPAROTOMY Right 05/04/2018   Procedure: EXPLORATORY LAPAROTOMY right colectomy right and left ostomy;  Surgeon: Herbert Pun, MD;  Location: [facility] ORS;  Service: General;  Laterality: Right;  . LUNG BIOPSY    . MASTECTOMY Left    2000, [facility]  . ROTATOR CUFF REPAIR Right    [facility]    Prior to Admission medications   Medication Sig Start Date End Date Taking? Authorizing Provider  acetaminophen (TYLENOL) 325 MG tablet Take 2 tablets (650 mg total) by mouth every 6 (six) hours as needed for mild pain (or Fever >/= 101). 12/24/18  Yes Gouru, Illene Silver, MD  albuterol (PROVENTIL HFA;VENTOLIN HFA) 108 (90 Base) MCG/ACT inhaler Inhale 2 puffs into the lungs every 6 (six) hours as needed for wheezing or shortness of  breath. 04/24/18  Yes Demetrios Loll, MD  calcium-vitamin D (OSCAL WITH D) 250-125 MG-UNIT tablet Take 1 tablet by mouth daily.   Yes [provider]  carvedilol (COREG) 3.125 MG tablet Take 3.125 mg by mouth 2 (two) times daily with a meal.   Yes [provider]  citalopram (CELEXA) 20 MG tablet Take 40 mg by mouth daily.    Yes [provider]  ferrous sulfate 325 (65 FE) MG tablet Take 1 tablet (325 mg total) by mouth 2 (two) times daily with a meal. 07/02/18  Yes [name], MD  Fluticasone-Umeclidin-Vilant 100-62.5-25 MCG/INH AEPB Inhale 1 puff into the lungs daily.  01/07/19  Yes [provider]  furosemide (LASIX) 20 MG tablet Take 1 tablet (20 mg total) by mouth daily. And additional '20mg'$  dose as needed Patient taking differently: Take 20-40 mg by mouth See admin instructions. Take 1 tablet ('20mg'$ ) by mouth daily - may take a second tablet ('20mg'$ ) by mouth daily as needed for severe swelling 06/15/19  Yes Hackney, Tina A, FNP  guaiFENesin-dextromethorphan (ROBITUSSIN DM) 100-10 MG/5ML syrup Take 5 mLs by mouth every 4 (four) hours as needed for cough.   Yes  [provider]  megestrol (MEGACE ES) 625 MG/5ML suspension Take 5 mLs (625 mg total) by mouth daily. 09/21/19  Yes [name], MD  Multiple Vitamin (MULTIVITAMIN WITH MINERALS) TABS tablet Take 1 tablet by mouth daily. 06/06/18  Yes Fritzi Mandes, MD  pantoprazole (PROTONIX) 40 MG tablet Take 1 tablet (40 mg total) by mouth 2 (two) times daily before a meal. 07/23/18  Yes Sudini, Alveta Heimlich, MD  potassium chloride 20 MEQ TBCR Take 10 mEq by mouth daily. 10/02/19  Yes Danford, Suann Larry, MD  sucralfate (CARAFATE) 1 GM/10ML suspension Take 10 mLs (1 g total) by mouth 4 (four) times daily. 08/18/19  Yes Chrystal, Eulas Post, MD  loperamide (IMODIUM A-D) 2 MG tablet Take 1 tablet (2 mg total) by mouth as needed for diarrhea or loose stools. 10/04/18   Herbert Pun, MD    Family History   Problem Relation Age of Onset  . Breast cancer Mother 34  . Cancer Mother        Breast   . Cirrhosis Father   . Breast cancer Paternal Aunt 36  . Cancer Maternal Aunt        Breast      Social History   Tobacco Use  . Smoking status: Former Smoker    Packs/day: 0.50    Years: 20.00    Pack years: 10.00    Types: Cigarettes    Quit date: 07/02/2012    Years since quitting: 7.3  . Smokeless tobacco: Current User    Types: Snuff  . Tobacco comment: quit 2014  Substance Use Topics  . Alcohol use: Not Currently    Comment: Occasionally  . Drug use: No    Allergies as of 10/13/2019  . (No Known Allergies)    Review of Systems:    All systems reviewed and negative except where noted in HPI.   Physical Exam:  Vital signs in last 24 hours: Vitals:   10/19/19 1536 10/19/19 2028 10/20/19 0457 10/20/19 0750  BP: 111/84 113/76 114/79 112/74  Pulse: 92 86 86 86  Resp: '20 18 18 19  '$ Temp: 99.1 F (37.3 C) 98.3 F (36.8 C) 98.3 F (36.8 C) 99.2 F (37.3 C)  TempSrc: Oral Oral Oral Oral  SpO2: 98% 98% 96% 98%  Weight:   57.5 kg   Height:       Last BM Date: 10/18/19 General:   Pleasant, cooperative in NAD Head:  Normocephalic and atraumatic. Eyes:   No icterus.   Conjunctiva pink. PERRLA. Ears:  Normal auditory acuity. Neck:  Supple; no masses or thyroidomegaly Lungs: Respirations even and unlabored. Lungs clear to auscultation bilaterally.   No wheezes, crackles, or rhonchi.  Abdomen:  Soft, nondistended, nontender. Normal bowel sounds. No appreciable masses or hepatomegaly.  No rebound or guarding.  Mucous fistula site examined and appears pink and healthy with no bleeding.  White mucus seen extruding from the mucous fistula site. Ileostomy examination deferred, single piece of bag in place and wound care has already evaluated the ileostomy yesterday. Neurologic:  Alert and oriented x3;  grossly normal neurologically. Skin:  Intact without significant lesions or  rashes. Cervical Nodes:  No significant cervical adenopathy. Psych:  Alert and cooperative. Normal affect.  LAB RESULTS: Recent Labs    10/18/19 0535 10/19/19 0608 10/20/19 0554  WBC 6.4 6.3 6.3  HGB 8.2* 7.9* 7.4*  HCT 24.5* 22.4* 22.7*  PLT 248 229 265   BMET Recent Labs    10/18/19 0535 10/19/19 0349 10/20/19 0554  NA 141 139 139  K 3.4* 4.0 4.3  CL 119* 114* 111  CO2 16* 18* 21*  GLUCOSE 77 82 77  BUN 36* 25* 24*  CREATININE 1.98* 1.75* 1.80*  CALCIUM 8.3* 7.8* 7.8*   LFT No results for input(s): PROT, ALBUMIN, AST, ALT, ALKPHOS, BILITOT, BILIDIR, IBILI in the last 72 hours. PT/INR No results for input(s): LABPROT, INR in the last 72 hours.  STUDIES: No results found.    Impression / Plan:   Danielle [name] is a 64 y.o. y/o female with ileostomy, hemicolectomy due to hepatic flexure ulceration considered to be from ischemia in 2019, with normocytic anemia  Patient does not have iron deficiency anemia and has had extensive work-up for anemia with hematology recently  As per Dr. Aletha Halim last notes, they were considering dexamethasone/Velcade for worsening anemia  Would recommend hematology consult as an inpatient, due to progressive anemia  Please note that the fecal occult test was collected from the ileostomy bag, therefore would not reflect any abnormalities from the colon itself.  In addition, in the setting of acute illness, fecal occult blood test is not considered a good test for GI bleed.  She does not have any signs of hemodynamically significant GI bleed at this time to warrant urgent endoscopic exams  She had visualization of her colon via the mucous fistula in October 2019  Wound care has evaluated the ileostomy stoma site, please see their detailed note for examination results.  Their examination did not report any bleeding around the stoma.  Please note that the consult states patient has history of ulcerative colitis.  This is not true.   Patient surgery in 2019 was due to colon ulceration in 2019 at the hepatic flexure, causing bleeding, and not due to ulcerative colitis.  After further evaluation by hematology, if they would like endoscopic evaluation, it can be considered.  However, at this time no endoscopic evaluation is indicated until further work-up by hematology given that there is no iron deficiency present and patient has had endoscopic procedures this year and last year and does not have any evidence of active GI bleeding at this time, with fecal occult blood test collected via an ileostomy bag not being a good indicator of active GI bleeding by itself  Thank you for involving me in the care of this patient.      LOS: 6 days   Virgel Manifold, MD  10/20/2019, 3:02 PM

## 2019-10-21 ENCOUNTER — Other Ambulatory Visit: Payer: Self-pay

## 2019-10-21 ENCOUNTER — Telehealth: Payer: Self-pay | Admitting: *Deleted

## 2019-10-21 DIAGNOSIS — E875 Hyperkalemia: Secondary | ICD-10-CM

## 2019-10-21 DIAGNOSIS — C155 Malignant neoplasm of lower third of esophagus: Secondary | ICD-10-CM

## 2019-10-21 DIAGNOSIS — N179 Acute kidney failure, unspecified: Secondary | ICD-10-CM

## 2019-10-21 DIAGNOSIS — N189 Chronic kidney disease, unspecified: Secondary | ICD-10-CM

## 2019-10-21 DIAGNOSIS — I10 Essential (primary) hypertension: Secondary | ICD-10-CM

## 2019-10-21 DIAGNOSIS — D500 Iron deficiency anemia secondary to blood loss (chronic): Secondary | ICD-10-CM

## 2019-10-21 DIAGNOSIS — I5022 Chronic systolic (congestive) heart failure: Secondary | ICD-10-CM

## 2019-10-21 LAB — BASIC METABOLIC PANEL
Anion gap: 6 (ref 5–15)
BUN: 19 mg/dL (ref 8–23)
CO2: 22 mmol/L (ref 22–32)
Calcium: 7.8 mg/dL — ABNORMAL LOW (ref 8.9–10.3)
Chloride: 110 mmol/L (ref 98–111)
Creatinine, Ser: 1.7 mg/dL — ABNORMAL HIGH (ref 0.44–1.00)
GFR calc Af Amer: 36 mL/min — ABNORMAL LOW (ref >60)
GFR calc non Af Amer: 31 mL/min — ABNORMAL LOW (ref >60)
Glucose, Bld: 77 mg/dL (ref 70–99)
Potassium: 4.6 mmol/L (ref 3.5–5.1)
Sodium: 138 mmol/L (ref 135–145)

## 2019-10-21 LAB — CBC
HCT: 23 % — ABNORMAL LOW (ref 36.0–46.0)
Hemoglobin: 7.6 g/dL — ABNORMAL LOW (ref 12.0–15.0)
MCH: 31.1 pg (ref 26.0–34.0)
MCHC: 33 g/dL (ref 30.0–36.0)
MCV: 94.3 fL (ref 80.0–100.0)
Platelets: 272 10*3/uL (ref 150–400)
RBC: 2.44 MIL/uL — ABNORMAL LOW (ref 3.87–5.11)
RDW: 15.4 % (ref 11.5–15.5)
WBC: 5.5 10*3/uL (ref 4.0–10.5)
nRBC: 0 % (ref 0.0–0.2)

## 2019-10-21 MED ORDER — NEPRO/CARBSTEADY PO LIQD: 237.0000 mL | Freq: Three times a day (TID) | ORAL | 0 refills | Status: DC

## 2019-10-21 MED ORDER — SODIUM CHLORIDE 0.9 % IV SOLN
200.0000 mg | Freq: Once | INTRAVENOUS | Status: AC
  Administered 2019-10-21: 11:00:00 200 mg via INTRAVENOUS
  Filled 2019-10-21: qty 10

## 2019-10-21 MED ORDER — SODIUM BICARBONATE 650 MG PO TABS: 1300.0000 mg | ORAL_TABLET | Freq: Three times a day (TID) | ORAL | 0 refills | Status: DC

## 2019-10-21 NOTE — Plan of Care (Signed)
  Problem: Education: Goal: Knowledge of General Education information will improve Description: Including pain rating scale, medication(s)/side effects and non-pharmacologic comfort measures Outcome: Progressing   Problem: Health Behavior/Discharge Planning: Goal: Ability to manage health-related needs will improve Outcome: Progressing   Problem: Coping: Goal: Level of anxiety will decrease Outcome: Progressing   Problem: Elimination: Goal: Will not experience complications related to bowel motility Outcome: Progressing Goal: Will not experience complications related to urinary retention Outcome: Progressing

## 2019-10-21 NOTE — Discharge Summary (Signed)
[facility] [place] NAME: Danielle [name]    MR#:  [id number]  DATE OF BIRTH:  [birth year]  DATE OF ADMISSION:  10/14/2019 ADMITTING PHYSICIAN: Wyvonnia Dusky, MD  DATE OF DISCHARGE: 10/21/2019  1:10 PM  PRIMARY CARE PHYSICIAN: [facility], [facility]    ADMISSION DIAGNOSIS:  Hyperkalemia [E87.5] Acute renal failure superimposed on chronic kidney disease, unspecified CKD stage, unspecified acute renal failure type ([place]) [N17.9, N18.9]  DISCHARGE DIAGNOSIS:  Principal Problem:   Hyperkalemia Active Problems:   Moderate COPD (chronic obstructive pulmonary disease) (HCC)   AKI (acute kidney injury) (HCC)   Chronic systolic heart failure (HCC)   HTN (hypertension)   CKD (chronic kidney disease) stage 4, GFR 15-29 ml/min (HCC)   History of 2019 novel coronavirus disease (COVID-19)   SECONDARY DIAGNOSIS:   Past Medical History:  Diagnosis Date  . Anxiety   . Arthritis   . Breast cancer ([place]) 2000  . Cancer ([place]) left    breast cancer 2000, chemo tx's with total mastectomy and lymph nodes resected.   . Cancer of right lung ([place]) 02/21/2016   rad tx's.   . CHF (congestive heart failure) ([place])   . COPD (chronic obstructive pulmonary disease) ([place])   . Dependence on supplemental oxygen   . Depression   . Heart murmur   . Hypertension   . Lung nodule   . Lymphedema   . Personal history of chemotherapy   . Personal history of radiation therapy   . Shortness of breath dyspnea    with exertion  . Status post chemotherapy 2001   left breast cancer  . Status post radiation therapy 2001   left breast cancer    HOSPITAL COURSE:   1.  Acute kidney injury on chronic kidney disease stage IIIb.  Patient came in with acute kidney injury and hyperkalemia.  Creatinine gradually improved over time and has remained stable for the last few days.  Follow-up with nephrology as outpatient. 2.  Acute on chronic anemia.  Patient received a  blood transfusion during the hospital course.  Patient's fecal occult blood test was positive.  Patient was seen by gastroenterology and they did not want to do any procedure at this time.  I did give some IV iron.  Nephrology considering Epogen as outpatient.  Follow-up with Dr. Rogue Bussing as outpatient.  Hemoglobin upon discharge 7.6.  Will need close clinical follow-up on blood counts. 3.  Recent COVID-19 diagnosis.  She had a positive test on 09/27/2019.  She received 5 days of remdesivir at discharge from the hospital on 10/02/2019.  She also got steroids at that time. 4.  Metabolic acidosis.  Nephrology recommends bicarb upon going home 5.  History of ulcerative colitis.  Likely the cause of her chronic bleeding. 6.  Hyperkalemia on presentation this has resolved 7.  Paroxysmal atrial fibrillation on Coreg.  Not on anticoagulation with chronic blood loss anemia. 8.  Hyponatremia this has resolved 9.  Generalized weakness.  Physical therapy recommended home with home health. 10.  Chronic systolic congestive heart failure.  Continue Coreg.  Can go back on low-dose Lasix as outpatient.  No ACE or ARB at this time secondary to hyperkalemia and acute kidney injury on presentation   DISCHARGE CONDITIONS:   Satisfactory  CONSULTS OBTAINED:  Treatment Team:  Virgel Manifold, MD  DRUG ALLERGIES:  No Known Allergies  DISCHARGE MEDICATIONS:   Allergies as of 10/21/2019   No Known Allergies  Medication List    STOP taking these medications   megestrol 625 MG/5ML suspension Commonly known as: MEGACE ES   Potassium Chloride ER 20 MEQ Tbcr     TAKE these medications   acetaminophen 325 MG tablet Commonly known as: TYLENOL Take 2 tablets (650 mg total) by mouth every 6 (six) hours as needed for mild pain (or Fever >/= 101).   albuterol 108 (90 Base) MCG/ACT inhaler Commonly known as: VENTOLIN HFA Inhale 2 puffs into the lungs every 6 (six) hours as needed for wheezing or  shortness of breath.   calcium-vitamin D 250-125 MG-UNIT tablet Commonly known as: OSCAL WITH D Take 1 tablet by mouth daily.   carvedilol 3.125 MG tablet Commonly known as: COREG Take 3.125 mg by mouth 2 (two) times daily with a meal.   citalopram 20 MG tablet Commonly known as: CELEXA Take 40 mg by mouth daily.   feeding supplement (NEPRO CARB STEADY) Liqd Take 237 mLs by mouth 3 (three) times daily between meals.   ferrous sulfate 325 (65 FE) MG tablet Take 1 tablet (325 mg total) by mouth 2 (two) times daily with a meal.   Fluticasone-Umeclidin-Vilant 100-62.5-25 MCG/INH Aepb Inhale 1 puff into the lungs daily.   furosemide 20 MG tablet Commonly known as: LASIX Take 1 tablet (20 mg total) by mouth daily. And additional 20mg  dose as needed What changed:   how much to take  when to take this  additional instructions   guaiFENesin-dextromethorphan 100-10 MG/5ML syrup Commonly known as: ROBITUSSIN DM Take 5 mLs by mouth every 4 (four) hours as needed for cough.   loperamide 2 MG tablet Commonly known as: Imodium A-D Take 1 tablet (2 mg total) by mouth as needed for diarrhea or loose stools.   multivitamin with minerals Tabs tablet Take 1 tablet by mouth daily.   pantoprazole 40 MG tablet Commonly known as: Protonix Take 1 tablet (40 mg total) by mouth 2 (two) times daily before a meal.   sodium bicarbonate 650 MG tablet Take 2 tablets (1,300 mg total) by mouth 3 (three) times daily.   sucralfate 1 GM/10ML suspension Commonly known as: CARAFATE Take 10 mLs (1 g total) by mouth 4 (four) times daily.        DISCHARGE INSTRUCTIONS:  Follow-up PMD 5 days Follow-up oncology as scheduled Follow-up nephrology in a few weeks  If you experience worsening of your admission symptoms, develop shortness of breath, life threatening emergency, suicidal or homicidal thoughts you must seek medical attention immediately by calling 911 or calling your MD immediately  if  symptoms less severe.  You Must read complete instructions/literature along with all the possible adverse reactions/side effects for all the Medicines you take and that have been prescribed to you. Take any new Medicines after you have completely understood and accept all the possible adverse reactions/side effects.   Please note  You were cared for by a hospitalist during your hospital stay. If you have any questions about your discharge medications or the care you received while you were in the hospital after you are discharged, you can call the unit and asked to speak with the hospitalist on call if the hospitalist that took care of you is not available. Once you are discharged, your primary care physician will handle any further medical issues. Please note that NO REFILLS for any discharge medications will be authorized once you are discharged, as it is imperative that you return to your primary care physician (or establish a relationship  with a primary care physician if you do not have one) for your aftercare needs so that they can reassess your need for medications and monitor your lab values.    Today   CHIEF COMPLAINT:   Chief Complaint  Patient presents with  . Dizziness    HISTORY OF PRESENT ILLNESS:  Danielle [name]  is a 64 y.o. female came in with dizziness   VITAL SIGNS:  Blood pressure 114/73, pulse 89, temperature 98.8 F (37.1 C), resp. rate 18, height 5\' 6"  (1.676 m), weight 56.8 kg, SpO2 98 %.   PHYSICAL EXAMINATION:  GENERAL:  64 y.o.-year-old patient lying in the bed with no acute distress.  EYES: Pupils equal, round, reactive to light and accommodation. No scleral icterus. Extraocular muscles intact.  HEENT: Head atraumatic, normocephalic. Oropharynx and nasopharynx clear.  NECK:  Supple, no jugular venous distention. No thyroid enlargement, no tenderness.  LUNGS: Normal breath sounds bilaterally, no wheezing, rales,rhonchi or crepitation. No use of accessory  muscles of respiration.  CARDIOVASCULAR: S1, S2 normal. No murmurs, rubs, or gallops.  ABDOMEN: Soft, non-tender, non-distended. Bowel sounds present. No organomegaly or mass.  EXTREMITIES: No pedal edema, cyanosis, or clubbing.  NEUROLOGIC: Cranial nerves II through XII are intact. Muscle strength 5/5 in all extremities. Sensation intact. Gait not checked.  PSYCHIATRIC: The patient is alert and oriented x 3.  SKIN: No obvious rash, lesion, or ulcer.   DATA REVIEW:   CBC Recent Labs  Lab 10/21/19 0519  WBC 5.5  HGB 7.6*  HCT 23.0*  PLT 272    Chemistries  Recent Labs  Lab 10/21/19 0519  NA 138  K 4.6  CL 110  CO2 22  GLUCOSE 77  BUN 19  CREATININE 1.70*  CALCIUM 7.8*    Microbiology Results  Results for orders placed or performed during the hospital encounter of 10/14/19  SARS CORONAVIRUS 2 (TAT 6-24 HRS) Nasopharyngeal Nasopharyngeal Swab     Status: Abnormal   Collection Time: 10/14/19  9:34 AM   Specimen: Nasopharyngeal Swab  Result Value Ref Range Status   SARS Coronavirus 2 POSITIVE (A) NEGATIVE Final    Comment: RESULT CALLED TO, READ BACK BY AND VERIFIED WITH: B.ALEJA RN 2703 10/14/2019 MCCORMICK K (NOTE) SARS-CoV-2 target nucleic acids are DETECTED. The SARS-CoV-2 RNA is generally detectable in upper and lower respiratory specimens during the acute phase of infection. Positive results are indicative of the presence of SARS-CoV-2 RNA. Clinical correlation with patient history and other diagnostic information is  necessary to determine patient infection status. Positive results do not rule out bacterial infection or co-infection with other viruses.  The expected result is Negative. Fact Sheet for Patients: SugarRoll.be Fact Sheet for Healthcare Providers: https://www.woods-mathews.com/ This test is not yet approved or cleared by the [place] FDA and  has been authorized for detection and/or diagnosis of  SARS-CoV-2 by FDA under an Emergency Use Authorization (EUA). This EUA will remain  in effect (meaning this test can be used) for  the duration of the COVID-19 declaration under Section 564(b)(1) of the Act, 21 U.S.C. section 360bbb-3(b)(1), unless the authorization is terminated or revoked sooner. Performed at [facility], [place] [address] [place], [place] [address]       Management plans discussed with the patient, family and they are in agreement.  CODE STATUS:     Code Status Orders  (From admission, onward)         Start     Ordered   10/14/19 0249  Full  code  Continuous     10/14/19 0252        Code Status History    Date Active Date Inactive Code Status Order ID Comments User Context   09/27/2019 0211 10/02/2019 1736 Full Code [id number]  Athena Masse, MD ED   06/19/2019 1124 06/20/2019 1742 Full Code [id number]  Lang Snow, NP ED   12/22/2018 0844 12/24/2018 1727 Full Code [id number]  Harrie Foreman, MD Inpatient   09/28/2018 0813 10/04/2018 1522 Full Code [id number]  Herbert Pun, MD Inpatient   09/05/2018 0616 09/10/2018 1708 Full Code [id number]  Arta Silence, MD ED   08/12/2018 0540 08/16/2018 2057 Full Code [id number]  Arta Silence, MD ED   07/21/2018 0332 07/23/2018 1718 Full Code [id number]  Derrek Monaco, RN Inpatient   07/18/2018 0640 07/21/2018 0330 DNR [id number]  Harrie Foreman, MD Inpatient   07/05/2018 0808 07/09/2018 1946 DNR [id number]  Harrie Foreman, MD Inpatient   06/30/2018 1733 07/02/2018 1507 DNR [id number]  [name], MD Inpatient   06/30/2018 0019 06/30/2018 1733 Full Code [id number]  Amelia Jo, MD Inpatient   06/04/2018 0113 06/05/2018 1448 Full Code [id number]  Amelia Jo, MD Inpatient   04/21/2018 0633 05/17/2018 2214 Full Code [id number]  Arta Silence, MD Inpatient   02/09/2017 1125 02/11/2017 1706 Full Code [id number]  Henreitta Leber, MD Inpatient   Advance Care Planning Activity     Advance Directive Documentation     Most Recent Value  Type of Advance Directive  Healthcare Power of Attorney  Pre-existing out of facility DNR order (yellow form or pink MOST form)  --  "MOST" Form in Place?  --      TOTAL TIME TAKING CARE OF THIS PATIENT: 35 minutes.    Loletha Grayer M.D on 10/21/2019 at 5:02 PM  Between 7am to 6pm - Pager - [phone]  After 6pm go to [email or web address] - Proofreader  [facility]  CC: Primary care physician; [facility], [facility]

## 2019-10-21 NOTE — Discharge Instructions (Signed)

## 2019-10-21 NOTE — Progress Notes (Signed)
Discharge instructions explained to pt/ verbalized an understanding / iv and tele removed/ transported off unit via wheelchair.  

## 2019-10-21 NOTE — Telephone Encounter (Signed)
Contacted patient: re: her apts tomorrow. She tested positive for covid 1 week ago. I reached out to the teams to have her apts r/s

## 2019-10-21 NOTE — Progress Notes (Signed)
Physical Therapy Treatment Patient Details Name: Danielle [name] MRN: [id number] DOB: Jun 22, 1956 Today's Date: 10/21/2019    History of Present Illness 64 year old female admitted with hyperkalemia, reports of dizziness, back and abdominal pain.[name] Recently discharged Covid +. PMH includes: COPD, AKI, HF, HTN, CKD    PT Comments    Pt resting in bed upon arrival reporting she was going home this afternoon however agreeable to PT. Pt is progressing well towards PT goals. No pain reported.  Pt ambulated within room with overall steady gait without AD. Pt reaching out to use wall or bed rail to steady although no overt LOB or unsteadiness noted. Pt able to maintain conversation on cell phone while ambulating. Pt reporting mild SOB post ambulation. LE therex performed post ambulation including 5x STS from bed with pt utilizing UEs to assist powering up to full upright standing and standing therex at countertop. Pt performed therex with min cuing for correct performance. Pt independent with bed mobility and transfers and supervision assist for ambulation. Pt will continue to benefit from acute PT to further improve strength, balance, and endurance deficits.    Follow Up Recommendations  Home health PT;Supervision - Intermittent     Equipment Recommendations  None recommended by PT    Recommendations for Other Services       Precautions / Restrictions Precautions Precautions: Fall Restrictions Weight Bearing Restrictions: No    Mobility  Bed Mobility Overal bed mobility: Independent             General bed mobility comments: no physical assist needed to get in/out of bed  Transfers Overall transfer level: Independent Equipment used: None   Sit to Stand: Independent         General transfer comment: ind with STS from bed, good overall safety and confidence, no unsteadiness  Ambulation/Gait Ambulation/Gait assistance: Supervision Gait Distance (Feet): 75 Feet Assistive  device: None Gait Pattern/deviations: Step-through pattern;Decreased stride length Gait velocity: decreased   General Gait Details: pt ambulated in room safely without AD, supervision for safety, few times pt reaching out for bed rail or wall but no unsteadiness or over LOB noted, min SOB post ambulation with pt recovering quickly with seated rest   Stairs             Wheelchair Mobility    Modified Rankin (Stroke Patients Only)       Balance Overall balance assessment: Mild deficits observed, not formally tested                                          Cognition Arousal/Alertness: Awake/alert Behavior During Therapy: WFL for tasks assessed/performed Overall Cognitive Status: Within Functional Limits for tasks assessed                                        Exercises Other Exercises Other Exercises: 5x STS from bed, pt utilizing UE to assist with powering to upright standing, overall steady however slow between transition Other Exercises: standing LE therex performed at counter 10 reps each leg of hip ABD, hip extension and heel raises    General Comments        Pertinent Vitals/Pain Pain Assessment: No/denies pain    Home Living  Prior Function            PT Goals (current goals can now be found in the care plan section) Progress towards PT goals: Progressing toward goals    Frequency    Min 2X/week      PT Plan Current plan remains appropriate    Co-evaluation              AM-PAC PT "6 Clicks" Mobility   Outcome Measure  Help needed turning from your back to your side while in a flat bed without using bedrails?: None Help needed moving from lying on your back to sitting on the side of a flat bed without using bedrails?: None Help needed moving to and from a bed to a chair (including a wheelchair)?: A Little Help needed standing up from a chair using your arms (e.g.,  wheelchair or bedside chair)?: A Little Help needed to walk in hospital room?: A Little Help needed climbing 3-5 steps with a railing? : A Little 6 Click Score: 20    End of Session Equipment Utilized During Treatment: Gait belt Activity Tolerance: Patient tolerated treatment well Patient left: in bed;with call bell/phone within reach;with nursing/sitter in room Nurse Communication: Mobility status PT Visit Diagnosis: Muscle weakness (generalized) (M62.81);Difficulty in walking, not elsewhere classified (R26.2)     Time: 9562-1308 PT Time Calculation (min) (ACUTE ONLY): 19 min  Charges:  $Therapeutic Exercise: 8-22 mins                     Benelli Winther PT, DPT 12:05 PM,10/21/19 [phone]    Leaner Morici Drucilla Chalet 10/21/2019, 12:02 PM

## 2019-10-21 NOTE — TOC Progression Note (Signed)
Transition of Care [facility]) - Progression Note    Patient Details  Name: Danielle [name] MRN: [id number] Date of Birth: 1956-06-14  Transition [facility]) CM/SW [place], LCSW Phone Number: 10/21/2019, 2:32 PM  Clinical Narrative:   [facility], [facility], [facility], [facility], [facility], [facility], and [facility] have all declined providing services to pt. Pt has d/c at this time. Will notify MD and pt's family.    Expected Discharge Plan: [place] Barriers to Discharge: Continued Medical Work up  Expected Discharge Plan and Services Expected Discharge Plan: Start   Discharge Planning Services: CM Consult   Living arrangements for the past 2 months: Single Family Home Expected Discharge Date: 10/21/19                                     Social Determinants of Health (SDOH) Interventions    Readmission Risk Interventions Readmission Risk Prevention Plan 10/16/2019 09/28/2019 06/20/2019  Transportation Screening Complete Complete Complete  PCP or Specialist Appt within 3-5 Days - - -  [place] or [place] Work Consult for [place] - - -  Medication Review (RN Care Manager) Referral to Pharmacy Complete Complete  [place] or [place] Complete Patient refused Complete  SW Recovery Care/Counseling Consult Complete Complete -  Palliative Care Screening Not Applicable Not Applicable Not Applicable  Skilled Nursing Facility Not Applicable Not Applicable -  Some recent data might be hidden

## 2019-10-22 ENCOUNTER — Ambulatory Visit: Payer: Medicare Other | Admitting: Radiation Oncology

## 2019-10-22 ENCOUNTER — Inpatient Hospital Stay: Payer: Medicare Other

## 2019-10-22 ENCOUNTER — Inpatient Hospital Stay: Payer: Medicare Other | Admitting: Internal Medicine

## 2019-10-22 ENCOUNTER — Encounter (INDEPENDENT_AMBULATORY_CARE_PROVIDER_SITE_OTHER): Payer: Self-pay

## 2019-10-24 ENCOUNTER — Encounter (INDEPENDENT_AMBULATORY_CARE_PROVIDER_SITE_OTHER): Payer: Self-pay

## 2019-10-27 ENCOUNTER — Other Ambulatory Visit: Payer: Self-pay | Admitting: *Deleted

## 2019-10-27 ENCOUNTER — Ambulatory Visit (INDEPENDENT_AMBULATORY_CARE_PROVIDER_SITE_OTHER): Payer: Medicare Other | Admitting: Gastroenterology

## 2019-10-27 DIAGNOSIS — Z932 Ileostomy status: Secondary | ICD-10-CM

## 2019-10-27 DIAGNOSIS — D539 Nutritional anemia, unspecified: Secondary | ICD-10-CM

## 2019-10-27 DIAGNOSIS — D649 Anemia, unspecified: Secondary | ICD-10-CM | POA: Diagnosis not present

## 2019-10-27 DIAGNOSIS — D631 Anemia in chronic kidney disease: Secondary | ICD-10-CM

## 2019-10-27 DIAGNOSIS — C155 Malignant neoplasm of lower third of esophagus: Secondary | ICD-10-CM

## 2019-10-27 DIAGNOSIS — N184 Chronic kidney disease, stage 4 (severe): Secondary | ICD-10-CM

## 2019-10-27 DIAGNOSIS — Z9049 Acquired absence of other specified parts of digestive tract: Secondary | ICD-10-CM

## 2019-10-27 DIAGNOSIS — C159 Malignant neoplasm of esophagus, unspecified: Secondary | ICD-10-CM

## 2019-10-27 NOTE — Progress Notes (Signed)
Danielle [name] , MD [address]  [place]  [place], [place] [address]  Main: [phone]  Fax: [phone]   Primary Care Physician: [facility]  Virtual Visit via Telephone Note  I connected with patient on 10/27/19 at 10:00 AM EST by telephone and verified that I am speaking with the correct person using two identifiers.   I discussed the limitations, risks, security and privacy concerns of performing an evaluation and management service by telephone and the availability of in person appointments. I also discussed with the patient that there may be a patient responsible charge related to this service. The patient expressed understanding and agreed to proceed.  Location of Patient: Home Location of Provider: Home Persons involved: Patient and provider only   History of Present Illness:   Hospital follow-up  HPI: Danielle [name] is a 64 y.o. 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  Follows with Dr. Rogue Bussing for the same.  Admitted on 06/19/2019 seen by Dr. Bonna Gains during the admission for anemia.  No overt blood loss.  Discharged.  Hemoglobin 6 days back was 10.7 g.  2 weeks back was 8.8 g.  She says that she had the EUS at [facility] and was told that the tumor was not resectable and is undergoing radiation soon.  States that her stool is not black in color.  Interval history   07/07/2019-10/26/2018  He was recently admitted to the hospital in January 2021 and Dr. Bonna Gains was consulted for a positive stool occult test from the ileostomy bag.  Unclear reason for stool occult testing.  The patient has an extensive history of anemia worked up by Dr. Rogue Bussing.   She says she has been doing okay since hospital discharge.  Has had occasional black-colored stools.  Presently her stools are  brown in color.  Her main concern is that she has a bit of dizziness when she stands up.  No other complaints no upcoming appointments with Dr. Rogue Bussing. Current Outpatient Medications  Medication Sig Dispense Refill  . acetaminophen (TYLENOL) 325 MG tablet Take 2 tablets (650 mg total) by mouth every 6 (six) hours as needed for mild pain (or Fever >/= 101).    [name] albuterol (PROVENTIL HFA;VENTOLIN HFA) 108 (90 Base) MCG/ACT inhaler Inhale 2 puffs into the lungs every 6 (six) hours as needed for wheezing or shortness of breath. 1 Inhaler 2  . calcium-vitamin D (OSCAL WITH D) 250-125 MG-UNIT tablet Take 1 tablet by mouth daily.    . carvedilol (COREG) 3.125 MG tablet Take 3.125 mg by mouth 2 (two) times daily with a meal.    . citalopram (CELEXA) 20 MG tablet Take 40 mg by mouth daily.     . ferrous sulfate 325 (65 FE) MG tablet Take 1 tablet (325 mg total) by mouth 2 (two) times daily with a meal. 60 tablet 3  . Fluticasone-Umeclidin-Vilant 100-62.5-25 MCG/INH AEPB Inhale 1 puff into the lungs daily.     . furosemide (LASIX) 20 MG tablet Take 1 tablet (20 mg total) by mouth daily. And additional 20mg  dose as needed (Patient taking differently: Take 20-40 mg by mouth See admin instructions. Take 1 tablet (20mg ) by mouth daily - may take a second tablet (20mg ) by mouth daily as needed for severe swelling) 40 tablet 5  . guaiFENesin-dextromethorphan (ROBITUSSIN DM)  100-10 MG/5ML syrup Take 5 mLs by mouth every 4 (four) hours as needed for cough.    . loperamide (IMODIUM A-D) 2 MG tablet Take 1 tablet (2 mg total) by mouth as needed for diarrhea or loose stools. 30 tablet 0  . Multiple Vitamin (MULTIVITAMIN WITH MINERALS) TABS tablet Take 1 tablet by mouth daily. 30 tablet 1  . Nutritional Supplements (FEEDING SUPPLEMENT, NEPRO CARB STEADY,) LIQD Take 237 mLs by mouth 3 (three) times daily between meals. 21330 mL 0  . pantoprazole (PROTONIX) 40 MG tablet Take 1 tablet (40 mg total) by mouth 2 (two) times  daily before a meal. 60 tablet 0  . sodium bicarbonate 650 MG tablet Take 2 tablets (1,300 mg total) by mouth 3 (three) times daily. 180 tablet 0  . sucralfate (CARAFATE) 1 GM/10ML suspension Take 10 mLs (1 g total) by mouth 4 (four) times daily. 1200 mL 2   No current facility-administered medications for this visit.    Allergies as of 10/27/2019  . (No Known Allergies)    Review of Systems:    All systems reviewed and negative except where noted in HPI.   Observations/Objective:  Labs: CMP     Component Value Date/Time   NA 138 10/21/2019 0519   NA 132 (L) 02/09/2015 1100   K 4.6 10/21/2019 0519   K 3.8 02/09/2015 1100   CL 110 10/21/2019 0519   CL 95 (L) 02/09/2015 1100   CO2 22 10/21/2019 0519   CO2 29 02/09/2015 1100   GLUCOSE 77 10/21/2019 0519   GLUCOSE 105 (H) 02/09/2015 1100   BUN 19 10/21/2019 0519   BUN 16 02/09/2015 1100   CREATININE 1.70 (H) 10/21/2019 0519   CREATININE 0.81 02/09/2015 1100   CALCIUM 7.8 (L) 10/21/2019 0519   CALCIUM 9.1 02/09/2015 1100   PROT 8.7 (H) 10/13/2019 2345   PROT 7.7 02/09/2015 1100   ALBUMIN 4.2 10/13/2019 2345   ALBUMIN 4.3 02/09/2015 1100   AST 15 10/13/2019 2345   AST 29 02/09/2015 1100   ALT 13 10/13/2019 2345   ALT 20 02/09/2015 1100   ALKPHOS 68 10/13/2019 2345   ALKPHOS 69 02/09/2015 1100   BILITOT 0.8 10/13/2019 2345   BILITOT 0.9 02/09/2015 1100   GFRNONAA 31 (L) 10/21/2019 0519   GFRNONAA >60 02/09/2015 1100   GFRAA 36 (L) 10/21/2019 0519   GFRAA >60 02/09/2015 1100   Lab Results  Component Value Date   WBC 5.5 10/21/2019   HGB 7.6 (L) 10/21/2019   HCT 23.0 (L) 10/21/2019   MCV 94.3 10/21/2019   PLT 272 10/21/2019    Imaging Studies: US RENAL  Result Date: 09/27/2019 CLINICAL DATA:  Acute kidney injury EXAM: RENAL / URINARY TRACT ULTRASOUND COMPLETE COMPARISON:  CT dated April 29, 2019 FINDINGS: Right Kidney: Renal measurements: 9.8 x 4.2 x 4.6 cm = volume: 100 mL. There is increased cortical  echogenicity. There is no hydronephrosis. Left Kidney: Renal measurements: 9.5 x 4.5 x 4.9 cm = volume: 110 mL. There is increased cortical echogenicity without evidence for hydronephrosis Bladder: Appears normal for degree of bladder distention. Other: None. IMPRESSION: 1. No acute abnormality. 2. Echogenic kidneys bilaterally which can be seen in patients with medical renal disease. Electronically Signed   By: Constance Holster M.D.   On: 09/27/2019 18:46   DG Abd Portable 1V  Result Date: 10/16/2019 CLINICAL DATA:  Generalized abdominal pain EXAM: PORTABLE ABDOMEN - 1 VIEW COMPARISON:  07/20/2018 FINDINGS: Stomach is distended with air. No obstructive changes  are seen. No free air is noted. Diffuse aortic calcifications are noted without aneurysmal dilatation. Tubal ligation clips are seen. Degenerative changes of lumbar spine are noted. IMPRESSION: No acute abnormality seen. Electronically Signed   By: Inez Catalina M.D.   On: 10/16/2019 11:33    Assessment and Plan:   JANASHA BARKALOW is a 64 y.o. y/o female with a history of squamous cell carcinoma of the esophagus recently diagnosed.   She is being followed by Dr. Rogue Bussing.  Recent hospital admission and GI was consulted for anemia.  Stool occult testing was performed from stool in the ileostomy bag.  Unclear reason for the same.  Stool occult testing is a test for colon cancer screening and would not be indicated in this person as stool was taken from an ileoostomy bag.   Stool occult testing has no role in the evaluation of anemia whatsoever.  If the patient has black tarry stool suggestive of melena and associated drop in hemoglobin would indicate possible bleeding from the upper GI tract and evaluation could include repeat EGD and spraying the malignant lesion with hemospray if needed.  Presently her stool is brown in color.  Her only complaint is that she feels a bit dizzy when she stands up.  I will get in touch with Dr. Aletha Halim office  and arrange for a CBC to be checked and if needed transfusion subsequently.    I discussed the assessment and treatment plan with the patient. The patient was provided an opportunity to ask questions and all were answered. The patient agreed with the plan and demonstrated an understanding of the instructions.   The patient was advised to call back or seek an in-person evaluation if the symptoms worsen or if the condition fails to improve as anticipated.  I provided 11 minutes of non-face-to-face time during this encounter.  Dr Danielle Bellows MD,MRCP [facility]) Gastroenterology/Hepatology Pager: [phone]   Speech recognition software was used to dictate this note.

## 2019-10-29 ENCOUNTER — Inpatient Hospital Stay: Payer: Medicare Other

## 2019-10-29 ENCOUNTER — Other Ambulatory Visit: Payer: Self-pay

## 2019-10-29 ENCOUNTER — Encounter: Payer: Self-pay | Admitting: Emergency Medicine

## 2019-10-29 ENCOUNTER — Emergency Department: Payer: Medicare Other

## 2019-10-29 ENCOUNTER — Inpatient Hospital Stay: Payer: Medicare Other | Attending: Internal Medicine

## 2019-10-29 ENCOUNTER — Inpatient Hospital Stay
Admission: EM | Admit: 2019-10-29 | Discharge: 2019-11-04 | DRG: 682 | Disposition: A | Discharge status: Home / Self Care | Payer: Medicare Other | Source: Ambulatory Visit | Attending: Hospitalist | Admitting: Hospitalist

## 2019-10-29 ENCOUNTER — Inpatient Hospital Stay (HOSPITAL_BASED_OUTPATIENT_CLINIC_OR_DEPARTMENT_OTHER): Payer: Medicare Other | Admitting: Internal Medicine

## 2019-10-29 DIAGNOSIS — Z170 Estrogen receptor positive status [ER+]: Secondary | ICD-10-CM | POA: Insufficient documentation

## 2019-10-29 DIAGNOSIS — I5022 Chronic systolic (congestive) heart failure: Secondary | ICD-10-CM | POA: Diagnosis present

## 2019-10-29 DIAGNOSIS — E860 Dehydration: Secondary | ICD-10-CM | POA: Diagnosis present

## 2019-10-29 DIAGNOSIS — R64 Cachexia: Secondary | ICD-10-CM | POA: Diagnosis present

## 2019-10-29 DIAGNOSIS — I10 Essential (primary) hypertension: Secondary | ICD-10-CM | POA: Diagnosis present

## 2019-10-29 DIAGNOSIS — Z79810 Long term (current) use of selective estrogen receptor modulators (SERMs): Secondary | ICD-10-CM | POA: Insufficient documentation

## 2019-10-29 DIAGNOSIS — E861 Hypovolemia: Secondary | ICD-10-CM | POA: Diagnosis present

## 2019-10-29 DIAGNOSIS — D631 Anemia in chronic kidney disease: Secondary | ICD-10-CM | POA: Diagnosis present

## 2019-10-29 DIAGNOSIS — Z8616 Personal history of COVID-19: Secondary | ICD-10-CM

## 2019-10-29 DIAGNOSIS — Z9049 Acquired absence of other specified parts of digestive tract: Secondary | ICD-10-CM

## 2019-10-29 DIAGNOSIS — N1832 Chronic kidney disease, stage 3b: Secondary | ICD-10-CM | POA: Diagnosis present

## 2019-10-29 DIAGNOSIS — Z923 Personal history of irradiation: Secondary | ICD-10-CM | POA: Insufficient documentation

## 2019-10-29 DIAGNOSIS — E871 Hypo-osmolality and hyponatremia: Secondary | ICD-10-CM | POA: Diagnosis present

## 2019-10-29 DIAGNOSIS — E872 Acidosis: Secondary | ICD-10-CM | POA: Diagnosis present

## 2019-10-29 DIAGNOSIS — F17290 Nicotine dependence, other tobacco product, uncomplicated: Secondary | ICD-10-CM | POA: Diagnosis present

## 2019-10-29 DIAGNOSIS — R630 Anorexia: Secondary | ICD-10-CM | POA: Insufficient documentation

## 2019-10-29 DIAGNOSIS — D649 Anemia, unspecified: Secondary | ICD-10-CM

## 2019-10-29 DIAGNOSIS — N170 Acute kidney failure with tubular necrosis: Secondary | ICD-10-CM | POA: Diagnosis present

## 2019-10-29 DIAGNOSIS — F419 Anxiety disorder, unspecified: Secondary | ICD-10-CM | POA: Diagnosis present

## 2019-10-29 DIAGNOSIS — Z932 Ileostomy status: Secondary | ICD-10-CM | POA: Diagnosis not present

## 2019-10-29 DIAGNOSIS — J449 Chronic obstructive pulmonary disease, unspecified: Secondary | ICD-10-CM | POA: Diagnosis present

## 2019-10-29 DIAGNOSIS — R42 Dizziness and giddiness: Secondary | ICD-10-CM | POA: Insufficient documentation

## 2019-10-29 DIAGNOSIS — E43 Unspecified severe protein-calorie malnutrition: Secondary | ICD-10-CM | POA: Diagnosis present

## 2019-10-29 DIAGNOSIS — C155 Malignant neoplasm of lower third of esophagus: Secondary | ICD-10-CM | POA: Diagnosis present

## 2019-10-29 DIAGNOSIS — F329 Major depressive disorder, single episode, unspecified: Secondary | ICD-10-CM | POA: Diagnosis present

## 2019-10-29 DIAGNOSIS — Z803 Family history of malignant neoplasm of breast: Secondary | ICD-10-CM

## 2019-10-29 DIAGNOSIS — R197 Diarrhea, unspecified: Secondary | ICD-10-CM

## 2019-10-29 DIAGNOSIS — Z85118 Personal history of other malignant neoplasm of bronchus and lung: Secondary | ICD-10-CM

## 2019-10-29 DIAGNOSIS — K219 Gastro-esophageal reflux disease without esophagitis: Secondary | ICD-10-CM | POA: Diagnosis present

## 2019-10-29 DIAGNOSIS — F32A Depression, unspecified: Secondary | ICD-10-CM | POA: Diagnosis present

## 2019-10-29 DIAGNOSIS — D539 Nutritional anemia, unspecified: Secondary | ICD-10-CM

## 2019-10-29 DIAGNOSIS — D472 Monoclonal gammopathy: Secondary | ICD-10-CM | POA: Diagnosis present

## 2019-10-29 DIAGNOSIS — I4581 Long QT syndrome: Secondary | ICD-10-CM | POA: Diagnosis present

## 2019-10-29 DIAGNOSIS — D500 Iron deficiency anemia secondary to blood loss (chronic): Secondary | ICD-10-CM | POA: Diagnosis present

## 2019-10-29 DIAGNOSIS — I509 Heart failure, unspecified: Secondary | ICD-10-CM | POA: Insufficient documentation

## 2019-10-29 DIAGNOSIS — Z79899 Other long term (current) drug therapy: Secondary | ICD-10-CM

## 2019-10-29 DIAGNOSIS — I959 Hypotension, unspecified: Secondary | ICD-10-CM | POA: Insufficient documentation

## 2019-10-29 DIAGNOSIS — I5023 Acute on chronic systolic (congestive) heart failure: Secondary | ICD-10-CM | POA: Diagnosis present

## 2019-10-29 DIAGNOSIS — Z9221 Personal history of antineoplastic chemotherapy: Secondary | ICD-10-CM

## 2019-10-29 DIAGNOSIS — Z8701 Personal history of pneumonia (recurrent): Secondary | ICD-10-CM

## 2019-10-29 DIAGNOSIS — N179 Acute kidney failure, unspecified: Secondary | ICD-10-CM | POA: Diagnosis not present

## 2019-10-29 DIAGNOSIS — R634 Abnormal weight loss: Secondary | ICD-10-CM | POA: Insufficient documentation

## 2019-10-29 DIAGNOSIS — Z9012 Acquired absence of left breast and nipple: Secondary | ICD-10-CM

## 2019-10-29 DIAGNOSIS — N184 Chronic kidney disease, stage 4 (severe): Secondary | ICD-10-CM

## 2019-10-29 DIAGNOSIS — R011 Cardiac murmur, unspecified: Secondary | ICD-10-CM | POA: Insufficient documentation

## 2019-10-29 DIAGNOSIS — Z87891 Personal history of nicotine dependence: Secondary | ICD-10-CM | POA: Insufficient documentation

## 2019-10-29 DIAGNOSIS — R0602 Shortness of breath: Secondary | ICD-10-CM

## 2019-10-29 DIAGNOSIS — U071 COVID-19: Secondary | ICD-10-CM | POA: Insufficient documentation

## 2019-10-29 DIAGNOSIS — I130 Hypertensive heart and chronic kidney disease with heart failure and stage 1 through stage 4 chronic kidney disease, or unspecified chronic kidney disease: Secondary | ICD-10-CM | POA: Diagnosis present

## 2019-10-29 DIAGNOSIS — C9000 Multiple myeloma not having achieved remission: Secondary | ICD-10-CM | POA: Insufficient documentation

## 2019-10-29 DIAGNOSIS — Z853 Personal history of malignant neoplasm of breast: Secondary | ICD-10-CM

## 2019-10-29 DIAGNOSIS — C50812 Malignant neoplasm of overlapping sites of left female breast: Secondary | ICD-10-CM | POA: Insufficient documentation

## 2019-10-29 DIAGNOSIS — M549 Dorsalgia, unspecified: Secondary | ICD-10-CM | POA: Diagnosis present

## 2019-10-29 DIAGNOSIS — Z8719 Personal history of other diseases of the digestive system: Secondary | ICD-10-CM

## 2019-10-29 DIAGNOSIS — Z8379 Family history of other diseases of the digestive system: Secondary | ICD-10-CM

## 2019-10-29 LAB — COMPREHENSIVE METABOLIC PANEL
ALT: 17 U/L (ref 0–44)
AST: 22 U/L (ref 15–41)
Albumin: 4.5 g/dL (ref 3.5–5.0)
Alkaline Phosphatase: 66 U/L (ref 38–126)
Anion gap: 19 — ABNORMAL HIGH (ref 5–15)
BUN: 77 mg/dL — ABNORMAL HIGH (ref 8–23)
CO2: 17 mmol/L — ABNORMAL LOW (ref 22–32)
Calcium: 9.7 mg/dL (ref 8.9–10.3)
Chloride: 97 mmol/L — ABNORMAL LOW (ref 98–111)
Creatinine, Ser: 8.41 mg/dL — ABNORMAL HIGH (ref 0.44–1.00)
GFR calc Af Amer: 5 mL/min — ABNORMAL LOW (ref >60)
GFR calc non Af Amer: 5 mL/min — ABNORMAL LOW (ref >60)
Glucose, Bld: 110 mg/dL — ABNORMAL HIGH (ref 70–99)
Potassium: 4.9 mmol/L (ref 3.5–5.1)
Sodium: 133 mmol/L — ABNORMAL LOW (ref 135–145)
Total Bilirubin: 0.6 mg/dL (ref 0.3–1.2)
Total Protein: 9.5 g/dL — ABNORMAL HIGH (ref 6.5–8.1)

## 2019-10-29 LAB — CBC WITH DIFFERENTIAL/PLATELET
Abs Immature Granulocytes: 0.02 10*3/uL (ref 0.00–0.07)
Basophils Absolute: 0.1 10*3/uL (ref 0.0–0.1)
Basophils Relative: 1 %
Eosinophils Absolute: 0.1 10*3/uL (ref 0.0–0.5)
Eosinophils Relative: 1 %
HCT: 35.2 % — ABNORMAL LOW (ref 36.0–46.0)
Hemoglobin: 11.2 g/dL — ABNORMAL LOW (ref 12.0–15.0)
Immature Granulocytes: 0 %
Lymphocytes Relative: 14 %
Lymphs Abs: 1.3 10*3/uL (ref 0.7–4.0)
MCH: 30.8 pg (ref 26.0–34.0)
MCHC: 31.8 g/dL (ref 30.0–36.0)
MCV: 96.7 fL (ref 80.0–100.0)
Monocytes Absolute: 0.7 10*3/uL (ref 0.1–1.0)
Monocytes Relative: 8 %
Neutro Abs: 6.7 10*3/uL (ref 1.7–7.7)
Neutrophils Relative %: 76 %
Platelets: 415 10*3/uL — ABNORMAL HIGH (ref 150–400)
RBC: 3.64 MIL/uL — ABNORMAL LOW (ref 3.87–5.11)
RDW: 13.9 % (ref 11.5–15.5)
WBC: 8.8 10*3/uL (ref 4.0–10.5)
nRBC: 0 % (ref 0.0–0.2)

## 2019-10-29 LAB — URINALYSIS, COMPLETE (UACMP) WITH MICROSCOPIC
Bacteria, UA: NONE SEEN
Bilirubin Urine: NEGATIVE
Glucose, UA: NEGATIVE mg/dL
Hgb urine dipstick: NEGATIVE
Ketones, ur: NEGATIVE mg/dL
Nitrite: NEGATIVE
Protein, ur: NEGATIVE mg/dL
Specific Gravity, Urine: 1.02 (ref 1.005–1.030)
pH: 5 (ref 5.0–8.0)

## 2019-10-29 LAB — BASIC METABOLIC PANEL
Anion gap: 17 — ABNORMAL HIGH (ref 5–15)
BUN: 75 mg/dL — ABNORMAL HIGH (ref 8–23)
CO2: 17 mmol/L — ABNORMAL LOW (ref 22–32)
Calcium: 9.5 mg/dL (ref 8.9–10.3)
Chloride: 96 mmol/L — ABNORMAL LOW (ref 98–111)
Creatinine, Ser: 8.44 mg/dL — ABNORMAL HIGH (ref 0.44–1.00)
GFR calc Af Amer: 5 mL/min — ABNORMAL LOW (ref >60)
GFR calc non Af Amer: 4 mL/min — ABNORMAL LOW (ref >60)
Glucose, Bld: 92 mg/dL (ref 70–99)
Potassium: 5.1 mmol/L (ref 3.5–5.1)
Sodium: 130 mmol/L — ABNORMAL LOW (ref 135–145)

## 2019-10-29 LAB — CREATININE, URINE, RANDOM: Creatinine, Urine: 308 mg/dL

## 2019-10-29 LAB — CBC
HCT: 34.4 % — ABNORMAL LOW (ref 36.0–46.0)
Hemoglobin: 11 g/dL — ABNORMAL LOW (ref 12.0–15.0)
MCH: 30.9 pg (ref 26.0–34.0)
MCHC: 32 g/dL (ref 30.0–36.0)
MCV: 96.6 fL (ref 80.0–100.0)
Platelets: 406 10*3/uL — ABNORMAL HIGH (ref 150–400)
RBC: 3.56 MIL/uL — ABNORMAL LOW (ref 3.87–5.11)
RDW: 14 % (ref 11.5–15.5)
WBC: 8 10*3/uL (ref 4.0–10.5)
nRBC: 0 % (ref 0.0–0.2)

## 2019-10-29 LAB — SAMPLE TO BLOOD BANK

## 2019-10-29 LAB — BRAIN NATRIURETIC PEPTIDE: B Natriuretic Peptide: 85 pg/mL (ref 0.0–100.0)

## 2019-10-29 MED ORDER — HYDROXYZINE HCL 10 MG PO TABS
10.0000 mg | ORAL_TABLET | Freq: Three times a day (TID) | ORAL | Status: DC | PRN
  Filled 2019-10-29: qty 1

## 2019-10-29 MED ORDER — CALCIUM CARBONATE-VITAMIN D 500-200 MG-UNIT PO TABS
0.5000 | ORAL_TABLET | Freq: Every day | ORAL | Status: DC
  Filled 2019-10-29 (×4): qty 0.5

## 2019-10-29 MED ORDER — SODIUM CHLORIDE 0.9 % IV SOLN: INTRAVENOUS | Status: DC

## 2019-10-29 MED ORDER — HEPARIN SODIUM (PORCINE) 5000 UNIT/ML IJ SOLN
5000.0000 [IU] | Freq: Three times a day (TID) | INTRAMUSCULAR | Status: DC
  Administered 2019-10-29 – 2019-11-04 (×15): 5000 [IU] via SUBCUTANEOUS
  Filled 2019-10-29 (×16): qty 1

## 2019-10-29 MED ORDER — FERROUS SULFATE 325 (65 FE) MG PO TABS
325.0000 mg | ORAL_TABLET | Freq: Two times a day (BID) | ORAL | Status: DC
  Administered 2019-10-30 – 2019-11-04 (×11): 325 mg via ORAL
  Filled 2019-10-29 (×12): qty 1

## 2019-10-29 MED ORDER — CITALOPRAM HYDROBROMIDE 20 MG PO TABS: 40.0000 mg | ORAL_TABLET | Freq: Every day | ORAL | Status: DC

## 2019-10-29 MED ORDER — PANTOPRAZOLE SODIUM 40 MG PO TBEC
40.0000 mg | DELAYED_RELEASE_TABLET | Freq: Two times a day (BID) | ORAL | Status: DC
  Administered 2019-10-30 – 2019-11-04 (×11): 40 mg via ORAL
  Filled 2019-10-29 (×11): qty 1

## 2019-10-29 MED ORDER — ADULT MULTIVITAMIN W/MINERALS CH
1.0000 | ORAL_TABLET | Freq: Every day | ORAL | Status: DC
  Administered 2019-10-30 – 2019-11-04 (×6): 1 via ORAL
  Filled 2019-10-29 (×6): qty 1

## 2019-10-29 MED ORDER — SODIUM BICARBONATE 650 MG PO TABS
1300.0000 mg | ORAL_TABLET | Freq: Three times a day (TID) | ORAL | Status: DC
  Administered 2019-10-29 – 2019-10-30 (×2): 1300 mg via ORAL
  Filled 2019-10-29 (×3): qty 2

## 2019-10-29 MED ORDER — SODIUM CHLORIDE 0.9 % IV BOLUS
500.0000 mL | Freq: Once | INTRAVENOUS | Status: AC
  Administered 2019-10-29: 500 mL via INTRAVENOUS

## 2019-10-29 MED ORDER — ACETAMINOPHEN 325 MG PO TABS
650.0000 mg | ORAL_TABLET | Freq: Four times a day (QID) | ORAL | Status: DC | PRN
  Administered 2019-10-29 – 2019-11-03 (×5): 650 mg via ORAL
  Filled 2019-10-29 (×5): qty 2

## 2019-10-29 MED ORDER — SODIUM BICARBONATE-DEXTROSE 150-5 MEQ/L-% IV SOLN
150.0000 meq | INTRAVENOUS | Status: DC
  Administered 2019-10-29: 150 meq via INTRAVENOUS
  Filled 2019-10-29 (×3): qty 1000

## 2019-10-29 MED ORDER — UMECLIDINIUM BROMIDE 62.5 MCG/INH IN AEPB
1.0000 | INHALATION_SPRAY | Freq: Every day | RESPIRATORY_TRACT | Status: DC
  Administered 2019-10-30 – 2019-11-04 (×6): 1 via RESPIRATORY_TRACT
  Filled 2019-10-29: qty 7

## 2019-10-29 MED ORDER — NEPRO/CARBSTEADY PO LIQD
237.0000 mL | Freq: Three times a day (TID) | ORAL | Status: DC
  Administered 2019-10-30 – 2019-10-31 (×4): 237 mL via ORAL

## 2019-10-29 MED ORDER — FLUTICASONE-UMECLIDIN-VILANT 100-62.5-25 MCG/INH IN AEPB: 1.0000 | INHALATION_SPRAY | Freq: Every day | RESPIRATORY_TRACT | Status: DC

## 2019-10-29 MED ORDER — ALBUTEROL SULFATE HFA 108 (90 BASE) MCG/ACT IN AERS
2.0000 | INHALATION_SPRAY | Freq: Four times a day (QID) | RESPIRATORY_TRACT | Status: DC | PRN
  Filled 2019-10-29: qty 6.7

## 2019-10-29 MED ORDER — FLUTICASONE FUROATE-VILANTEROL 100-25 MCG/INH IN AEPB
1.0000 | INHALATION_SPRAY | Freq: Every day | RESPIRATORY_TRACT | Status: DC
  Administered 2019-10-30 – 2019-11-04 (×6): 1 via RESPIRATORY_TRACT
  Filled 2019-10-29: qty 28

## 2019-10-29 MED ORDER — GUAIFENESIN-DM 100-10 MG/5ML PO SYRP
5.0000 mL | ORAL_SOLUTION | ORAL | Status: DC | PRN
  Filled 2019-10-29: qty 5

## 2019-10-29 MED ORDER — SUCRALFATE 1 GM/10ML PO SUSP
1.0000 g | Freq: Four times a day (QID) | ORAL | Status: DC
  Administered 2019-10-30 – 2019-11-04 (×18): 1 g via ORAL
  Filled 2019-10-29 (×25): qty 10

## 2019-10-29 MED ORDER — LOPERAMIDE HCL 2 MG PO CAPS
2.0000 mg | ORAL_CAPSULE | ORAL | Status: DC | PRN
  Administered 2019-10-30: 09:00:00 2 mg via ORAL
  Filled 2019-10-29: qty 1

## 2019-10-29 NOTE — ED Triage Notes (Signed)
Pt was seen at [facility] and was brought to ED for acute renal failure. Pt reports pain to her back and right side and dizziness.

## 2019-10-29 NOTE — ED Provider Notes (Signed)
[facility] Emergency Department Provider Note   ____________________________________________   First MD Initiated Contact with Patient 10/29/19 1247     (approximate)  I have reviewed the triage vital signs and the nursing notes.   HISTORY  Chief Complaint Abdominal Pain and Dizziness    HPI Danielle [name] is a 64 y.o. female here for evaluation for fatigue, and not urinating.  Patient reports that for several days now she has barely been able to urinate.  He just feels like there is nothing to urinate.  She feels like when she eats or drinks anything and she has been trying and working to stay hydrated it just comes right out through her ostomy and this like she just does not get any of it.  She denies any ongoing coronavirus symptoms, did have coronavirus about 1 month ago and was admitted for it and treated  No shortness of breath.    Denies being in pain or discomfort except she does report she felt like she had a little bit of discomfort in her back prior.  Reports she just feels like she is fatigued and not urinating     Past Medical History:  Diagnosis Date  . Anxiety   . Arthritis   . Breast cancer ([place]) 2000  . Cancer ([place]) left    breast cancer 2000, chemo tx's with total mastectomy and lymph nodes resected.   . Cancer of right lung ([place]) 02/21/2016   rad tx's.   . CHF (congestive heart failure) ([place])   . COPD (chronic obstructive pulmonary disease) ([place])   . Dependence on supplemental oxygen   . Depression   . Heart murmur   . Hypertension   . Lung nodule   . Lymphedema   . Personal history of chemotherapy   . Personal history of radiation therapy   . Shortness of breath dyspnea    with exertion  . Status post chemotherapy 2001   left breast cancer  . Status post radiation therapy 2001   left breast cancer    Patient Active Problem List   Diagnosis Date Noted  . Acute renal failure superimposed on stage 4 chronic kidney  disease ([place]) 10/29/2019  . GERD (gastroesophageal reflux disease) 10/29/2019  . Depression 10/29/2019  . Hyperkalemia 10/14/2019  . History of 2019 novel coronavirus disease (COVID-19) 10/14/2019  . Acute respiratory failure due to COVID-19 ([place]) 09/27/2019  . Respiratory tract infection due to COVID-19 virus 09/27/2019  . Acute blood loss anemia 06/20/2019  . Anemia 06/20/2019  . GI bleeding 06/19/2019  . CKD (chronic kidney disease) stage 4, GFR 15-29 ml/min (HCC) 06/12/2019  . Malignant neoplasm of lower third of esophagus ([place]) 05/01/2019  . Acute on chronic respiratory failure with hypoxia and hypercapnia ([place]) 12/22/2018  . Ileostomy dysfunction ([place]) 09/28/2018  . Reflux esophagitis   . Iron deficiency anemia secondary to blood loss (chronic)   . SBO (small bowel obstruction) ([place]) 06/29/2018  . Chronic systolic heart failure ([place]) 06/09/2018  . HTN (hypertension) 06/09/2018  . Atrial fibrillation ([place]) 06/09/2018  . Lymphedema 06/09/2018  . Acute kidney injury superimposed on CKD ([place]) 06/04/2018  . Protein-calorie malnutrition, severe 06/04/2018  . Blood in stool   . Focal (segmental) acute (reversible) ischemia of large intestine ([name])   . Ulceration of intestine   . Diverticulosis of large intestine without diverticulitis   . Abnormal CT scan, colon   . Colitis   . COPD exacerbation ([place])   . Malnutrition of moderate  degree 04/23/2018  . Palliative care by specialist   . DNR (do not resuscitate) discussion   . Weakness generalized   . Hyponatremia 02/10/2017  . Syncope 02/09/2017  . Lung nodule, solitary 07/25/2016  . Malignant neoplasm of upper lobe of right lung ([place]) 07/25/2016  . Carcinoma of overlapping sites of left breast in female, estrogen receptor positive ([place]) 06/22/2016  . Multiple lung nodules 06/22/2016  . Lesion of right lung   . Breast cancer in female [facility]) 02/21/2016  . Moderate COPD (chronic obstructive pulmonary disease) ([place]) 08/24/2014     Past Surgical History:  Procedure Laterality Date  . Breast Biospy Left    [facility]  . BREAST SURGERY    . COLONOSCOPY N/A 04/30/2018   Procedure: COLONOSCOPY;  Surgeon: Virgel Manifold, MD;  Location: [facility] ENDOSCOPY;  Service: Endoscopy;  Laterality: N/A;  . COLONOSCOPY N/A 07/22/2018   Procedure: COLONOSCOPY;  Surgeon: Virgel Manifold, MD;  Location: [facility] ENDOSCOPY;  Service: Endoscopy;  Laterality: N/A;  . DILATION AND CURETTAGE OF UTERUS    . ELECTROMAGNETIC NAVIGATION BROCHOSCOPY Right 04/11/2016   Procedure: ELECTROMAGNETIC NAVIGATION BRONCHOSCOPY;  Surgeon: Vilinda Boehringer, MD;  Location: [facility] ORS;  Service: Cardiopulmonary;  Laterality: Right;  . ESOPHAGOGASTRODUODENOSCOPY N/A 07/22/2018   Procedure: ESOPHAGOGASTRODUODENOSCOPY (EGD);  Surgeon: Virgel Manifold, MD;  Location[facility] ENDOSCOPY;  Service: Endoscopy;  Laterality: N/A;  . ESOPHAGOGASTRODUODENOSCOPY (EGD) WITH PROPOFOL N/A 05/07/2018   Procedure: ESOPHAGOGASTRODUODENOSCOPY (EGD) WITH PROPOFOL;  Surgeon: Lucilla Lame, MD;  Location: [facility] ENDOSCOPY;  Service: Endoscopy;  Laterality: N/A;  . ESOPHAGOGASTRODUODENOSCOPY (EGD) WITH PROPOFOL N/A 04/24/2019   Procedure: ESOPHAGOGASTRODUODENOSCOPY (EGD) WITH PROPOFOL;  Surgeon: Jonathon Bellows, MD;  Location: [facility] ENDOSCOPY;  Service: Gastroenterology;  Laterality: N/A;  . EUS N/A 05/07/2019   Procedure: FULL UPPER ENDOSCOPIC ULTRASOUND (EUS) RADIAL;  Surgeon: Jola Schmidt, MD;  Location: [facility] ENDOSCOPY;  Service: Endoscopy;  Laterality: N/A;  . history of colonoscopy]    . ILEOSCOPY N/A 07/22/2018   Procedure: ILEOSCOPY THROUGH STOMA;  Surgeon: Virgel Manifold, MD;  Location: [facility] ENDOSCOPY;  Service: Endoscopy;  Laterality: N/A;  . ILEOSTOMY    . ILEOSTOMY N/A 09/08/2018   Procedure: ILEOSTOMY REVISION POSSIBLE CREATION;  Surgeon: Herbert Pun, MD;  Location: [facility] ORS;  Service: General;  Laterality: N/A;  . ILEOSTOMY CLOSURE N/A 08/15/2018   Procedure: DILATION  OF ILEOSTOMY STRICTURE;  Surgeon: Herbert Pun, MD;  Location: [facility] ORS;  Service: General;  Laterality: N/A;  . LAPAROTOMY Right 05/04/2018   Procedure: EXPLORATORY LAPAROTOMY right colectomy right and left ostomy;  Surgeon: Herbert Pun, MD;  Location: [facility] ORS;  Service: General;  Laterality: Right;  . LUNG BIOPSY    . MASTECTOMY Left    2000, [facility]  . ROTATOR CUFF REPAIR Right    [facility]    Prior to Admission medications   Medication Sig Start Date End Date Taking? Authorizing Provider  acetaminophen (TYLENOL) 325 MG tablet Take 2 tablets (650 mg total) by mouth every 6 (six) hours as needed for mild pain (or Fever >/= 101). 12/24/18   Gouru, Illene Silver, MD  albuterol (PROVENTIL HFA;VENTOLIN HFA) 108 (90 Base) MCG/ACT inhaler Inhale 2 puffs into the lungs every 6 (six) hours as needed for wheezing or shortness of breath. 04/24/18   Demetrios Loll, MD  calcium-vitamin D (OSCAL WITH D) 250-125 MG-UNIT tablet Take 1 tablet by mouth daily.    [provider]  carvedilol (COREG) 3.125 MG tablet Take 3.125 mg by mouth 2 (two) times daily with a meal.  [provider]  citalopram (CELEXA) 20 MG tablet Take 40 mg by mouth daily.     [provider]  ferrous sulfate 325 (65 FE) MG tablet Take 1 tablet (325 mg total) by mouth 2 (two) times daily with a meal. 07/02/18   [name], MD  Fluticasone-Umeclidin-Vilant 100-62.5-25 MCG/INH AEPB Inhale 1 puff into the lungs daily.  01/07/19   [provider]  furosemide (LASIX) 20 MG tablet Take 1 tablet (20 mg total) by mouth daily. And additional 20mg  dose as needed Patient taking differently: Take 20-40 mg by mouth See admin instructions. Take 1 tablet (20mg ) by mouth daily - may take a second tablet (20mg ) by mouth daily as needed for severe swelling 06/15/19   Darylene Price A, FNP  guaiFENesin-dextromethorphan (ROBITUSSIN DM) 100-10 MG/5ML syrup Take 5 mLs by mouth every 4 (four) hours as needed for cough.     [provider]  loperamide (IMODIUM A-D) 2 MG tablet Take 1 tablet (2 mg total) by mouth as needed for diarrhea or loose stools. 10/04/18   Herbert Pun, MD  Multiple Vitamin (MULTIVITAMIN WITH MINERALS) TABS tablet Take 1 tablet by mouth daily. 06/06/18   Fritzi Mandes, MD  Nutritional Supplements (FEEDING SUPPLEMENT, NEPRO CARB STEADY,) LIQD Take 237 mLs by mouth 3 (three) times daily between meals. 10/21/19   Loletha Grayer, MD  pantoprazole (PROTONIX) 40 MG tablet Take 1 tablet (40 mg total) by mouth 2 (two) times daily before a meal. 07/23/18   Sudini, Alveta Heimlich, MD  sodium bicarbonate 650 MG tablet Take 2 tablets (1,300 mg total) by mouth 3 (three) times daily. 10/21/19   Loletha Grayer, MD  sucralfate (CARAFATE) 1 GM/10ML suspension Take 10 mLs (1 g total) by mouth 4 (four) times daily. 08/18/19   Noreene Filbert, MD    Allergies Patient has no known allergies.  Family History  Problem Relation Age of Onset  . Breast cancer Mother 48  . Cancer Mother        Breast   . Cirrhosis Father   . Breast cancer Paternal Aunt 61  . Cancer Maternal Aunt        Breast     Social History Social History   Tobacco Use  . Smoking status: Former Smoker    Packs/day: 0.50    Years: 20.00    Pack years: 10.00    Types: Cigarettes    Quit date: 07/02/2012    Years since quitting: 7.3  . Smokeless tobacco: Current User    Types: Snuff  . Tobacco comment: quit 2014  Substance Use Topics  . Alcohol use: Not Currently    Comment: Occasionally  . Drug use: No    Review of Systems Constitutional: No fever/chills Eyes: No visual changes. ENT: No sore throat. Cardiovascular: Denies chest pain. Respiratory: Denies shortness of breath.  Had Covid a month ago Gastrointestinal: No abdominal pain.  Ostomy has had seemingly increased output for several days now nonbloody Genitourinary: Negative for dysuria. Musculoskeletal: Has a little bit of pain in her right back or flank  region gone away Skin: Negative for rash. Neurological: Negative for headaches, areas of focal weakness or numbness.    ____________________________________________   PHYSICAL EXAM:  VITAL SIGNS: ED Triage Vitals  Enc Vitals Group     BP 10/29/19 1130 (!) 96/58     Pulse Rate 10/29/19 1130 94     Resp 10/29/19 1130 20     Temp 10/29/19 1130 98.2 F (36.8 C)  Temp Source 10/29/19 1130 Oral     SpO2 10/29/19 1130 98 %     Weight 10/29/19 1047 113 lb (51.3 kg)     Height 10/29/19 1047 5\' 6"  (1.676 m)     Head Circumference --      Peak Flow --      Pain Score 10/29/19 1047 0     Pain Loc --      Pain Edu? --      Excl. in [place]? --     Constitutional: Alert and oriented. Well appearing and in no acute distress.  She is very pleasant. Eyes: Conjunctivae are normal. Head: Atraumatic. Nose: No congestion/rhinnorhea. Mouth/Throat: Mucous membranes are moist. Neck: No stridor.  No JVD Cardiovascular: Normal rate, regular rhythm. Grossly normal heart sounds.  Good peripheral circulation. Respiratory: Normal respiratory effort.  No retractions. Lungs CTAB. Gastrointestinal: Soft and nontender. No distention.  Ostomy bag currently empty.  Bandage to the left upper quadrant noted. Musculoskeletal: No lower extremity tenderness nor edema. Neurologic:  Normal speech and language. No gross focal neurologic deficits are appreciated.  Skin:  Skin is warm, dry and intact. No rash noted. Psychiatric: Mood and affect are normal. Speech and behavior are normal.  ____________________________________________   LABS (all labs ordered are listed, but only abnormal results are displayed)  Labs Reviewed  BASIC METABOLIC PANEL - Abnormal; Notable for the following components:      Result Value   Sodium 130 (*)    Chloride 96 (*)    CO2 17 (*)    BUN 75 (*)    Creatinine, Ser 8.44 (*)    GFR calc non Af Amer 4 (*)    GFR calc Af Amer 5 (*)    Anion gap 17 (*)    All other components  within normal limits  CBC - Abnormal; Notable for the following components:   RBC 3.56 (*)    Hemoglobin 11.0 (*)    HCT 34.4 (*)    Platelets 406 (*)    All other components within normal limits  URINALYSIS, COMPLETE (UACMP) WITH MICROSCOPIC  CBG MONITORING, ED   ____________________________________________  EKG   ____________________________________________  RADIOLOGY  CT Renal Stone Study  Result Date: 10/29/2019 CLINICAL DATA:  Acute renal failure. Right-sided back pain and dizziness. Lung cancer and breast cancer. EXAM: CT ABDOMEN AND PELVIS WITHOUT CONTRAST TECHNIQUE: Multidetector CT imaging of the abdomen and pelvis was performed following the standard protocol without IV contrast. COMPARISON:  09/27/2019 renal ultrasound. Abdominopelvic CT of 04/29/2019 FINDINGS: Lower chest: Clear lung bases. Normal heart size without pericardial or pleural effusion. Multivessel coronary artery atherosclerosis. Hepatobiliary: Hepatic cysts. Normal gallbladder, without biliary ductal dilatation. Pancreas: Mild pancreatic atrophy, without duct dilatation or acute inflammation. Spleen: Normal in size, without focal abnormality. Adrenals/Urinary Tract: Normal adrenal glands. No renal calculi or hydronephrosis. No hydroureter or ureteric calculi. No bladder calculi. Stomach/Bowel: Normal stomach, without wall thickening. Moderate amount of stool within the rectum. Scattered colonic diverticula. Left abdominal mucous fistula. Right abdominal end colostomy. Normal terminal ileum. Normal small bowel caliber. Vascular/Lymphatic: Advanced aortic and branch vessel atherosclerosis. No abdominopelvic adenopathy. Reproductive: Normal uterus and adnexa. Other: No significant free fluid. Mild pelvic floor laxity. No evidence of omental or peritoneal disease. Nonspecific subcutaneous nodule about the anterior right abdominal wall at 8 mm on 14/2, new. There is also subcutaneous nodule superficial the right gluteals of  11 mm on 29/2, new. Musculoskeletal: Mild osteopenia. L3 and L5 mild superior endplate compression deformities, similar to  on the prior. This presumes a transitional S1 vertebral body. IMPRESSION: 1.  No urinary tract calculi or hydronephrosis. 2. Otherwise, low sensitivity exam secondary to stone study technique. 3. L3 and L5 mild superior endplate compression deformities, chronic. 4. Extensive surgical changes within the colon, without acute complication. 5. Coronary artery atherosclerosis. Aortic Atherosclerosis (ICD10-I70.0). 6. New subcutaneous nodularity about the abdomen and right gluteal region. Correlate with sites of injections and consider physical exam. Electronically Signed   By: Abigail Miyamoto M.D.   On: 10/29/2019 14:06     ____________________________________________   PROCEDURES  Procedure(s) performed: None  Procedures  Critical Care performed: No  ____________________________________________   INITIAL IMPRESSION / ASSESSMENT AND PLAN / ED COURSE  Pertinent labs & imaging results that were available during my care of the patient were reviewed by me and considered in my medical decision making (see chart for details).   Labs reveal acute renal failure.  Symptom associated with at least some inclination of having some type of right sided back pain, will evaluate further to evaluate for possibly obstructive pathology, or intrinsic pathology.  I have consulted with nephrology Dr. Juleen China will see the patient today.  Discussed with the patient, will trial IV fluid bolus in case this is prerenal although she does not appear to acutely hypovolemic by exam but does have some hypotension and does note she is thirsty, but also tells me she has been eating and drinking a lot but is just going her right out through her ostomy  We will admit to the hospitalist service for further work-up, primary concern at this point is acute renal failure however etiology as of yet is not quite known.   She has tested positive for Covid less than 3 months ago and was treated for it, she does not appear to have any obvious ongoing infectious symptoms from it.  Clinical Course as of Oct 28 1426  Thu Oct 29, 2019  1321 Admission discused withDr. Blaine Hamper (hospitalist)   [MQ]    Clinical Course User Index [MQ] Delman Kitten, MD     ____________________________________________   FINAL CLINICAL IMPRESSION(S) / ED DIAGNOSES  Final diagnoses:  Acute kidney injury [facility])        Note:  This document was prepared using Dragon voice recognition software and may include unintentional dictation errors       Delman Kitten, MD 10/29/19 1428

## 2019-10-29 NOTE — Progress Notes (Signed)
citalopram on hold due to prolonged QTc. Will need reevaluated

## 2019-10-29 NOTE — Consult Note (Signed)
[facility]  CONSULT NOTE    Date: 10/29/2019                  Patient Name:  Danielle [name]  MRN: [id number]  DOB: 1956-02-02  Age / Sex: 64 y.o., female         PCP: [facility] [place]                 Service Requesting Consult: Hospitalist                 Reason for Consult: Acute renal failure            History of Present Illness: Danielle [name]  Was recently discharged from [facility] for COVID-19 infection. Patient states she has not been eating and having more diarrhea. She states she has been drinking though.   Patient states she stopped urinating and came to the ED for further evaluation.    Medications: Outpatient medications: (Not in a hospital admission)   Current medications: Current Facility-Administered Medications  Medication Dose Route Frequency Provider Last Rate Last Admin  . acetaminophen (TYLENOL) tablet 650 mg  650 mg Oral Q6H PRN Ivor Costa, MD      . heparin injection 5,000 Units  5,000 Units Subcutaneous Q8H Ivor Costa, MD      . hydrOXYzine (ATARAX/VISTARIL) tablet 10 mg  10 mg Oral TID PRN Ivor Costa, MD      . sodium bicarbonate 150 mEq in dextrose 5% 1000 mL infusion  150 mEq Intravenous Continuous Venessa Wickham, MD       Current Outpatient Medications  Medication Sig Dispense Refill  . acetaminophen (TYLENOL) 325 MG tablet Take 2 tablets (650 mg total) by mouth every 6 (six) hours as needed for mild pain (or Fever >/= 101).    [name] albuterol (PROVENTIL HFA;VENTOLIN HFA) 108 (90 Base) MCG/ACT inhaler Inhale 2 puffs into the lungs every 6 (six) hours as needed for wheezing or shortness of breath. 1 Inhaler 2  . calcium-vitamin D (OSCAL WITH D) 250-125 MG-UNIT tablet Take 1 tablet by mouth daily.    . carvedilol (COREG) 3.125 MG tablet Take 3.125 mg by mouth 2 (two) times daily with a meal.    . citalopram (CELEXA) 20 MG tablet Take 40 mg by mouth daily.     . ferrous sulfate 325 (65 FE) MG tablet Take 1 tablet  (325 mg total) by mouth 2 (two) times daily with a meal. 60 tablet 3  . Fluticasone-Umeclidin-Vilant 100-62.5-25 MCG/INH AEPB Inhale 1 puff into the lungs daily.     . furosemide (LASIX) 20 MG tablet Take 1 tablet (20 mg total) by mouth daily. And additional 20mg  dose as needed (Patient taking differently: Take 20-40 mg by mouth See admin instructions. Take 1 tablet (20mg ) by mouth daily - may take a second tablet (20mg ) by mouth daily as needed for severe swelling) 40 tablet 5  . guaiFENesin-dextromethorphan (ROBITUSSIN DM) 100-10 MG/5ML syrup Take 5 mLs by mouth every 4 (four) hours as needed for cough.    . loperamide (IMODIUM A-D) 2 MG tablet Take 1 tablet (2 mg total) by mouth as needed for diarrhea or loose stools. 30 tablet 0  . Multiple Vitamin (MULTIVITAMIN WITH MINERALS) TABS tablet Take 1 tablet by mouth daily. 30 tablet 1  . Nutritional Supplements (FEEDING SUPPLEMENT, NEPRO CARB STEADY,) LIQD Take 237 mLs by mouth 3 (three) times daily between meals. 21330 mL 0  . pantoprazole (PROTONIX) 40 MG  tablet Take 1 tablet (40 mg total) by mouth 2 (two) times daily before a meal. 60 tablet 0  . sodium bicarbonate 650 MG tablet Take 2 tablets (1,300 mg total) by mouth 3 (three) times daily. 180 tablet 0  . sucralfate (CARAFATE) 1 GM/10ML suspension Take 10 mLs (1 g total) by mouth 4 (four) times daily. 1200 mL 2      Allergies: No Known Allergies    Past Medical History: Past Medical History:  Diagnosis Date  . Anxiety   . Arthritis   . Breast cancer ([place]) 2000  . Cancer ([place]) left    breast cancer 2000, chemo tx's with total mastectomy and lymph nodes resected.   . Cancer of right lung ([place]) 02/21/2016   rad tx's.   . CHF (congestive heart failure) ([place])   . COPD (chronic obstructive pulmonary disease) ([place])   . Dependence on supplemental oxygen   . Depression   . Heart murmur   . Hypertension   . Lung nodule   . Lymphedema   . Personal history of chemotherapy   . Personal  history of radiation therapy   . Shortness of breath dyspnea    with exertion  . Status post chemotherapy 2001   left breast cancer  . Status post radiation therapy 2001   left breast cancer     Past Surgical History: Past Surgical History:  Procedure Laterality Date  . Breast Biospy Left    [facility]  . BREAST SURGERY    . COLONOSCOPY N/A 04/30/2018   Procedure: COLONOSCOPY;  Surgeon: Virgel Manifold, MD;  Location: [facility] ENDOSCOPY;  Service: Endoscopy;  Laterality: N/A;  . COLONOSCOPY N/A 07/22/2018   Procedure: COLONOSCOPY;  Surgeon: Virgel Manifold, MD;  Location: [facility] ENDOSCOPY;  Service: Endoscopy;  Laterality: N/A;  . DILATION AND CURETTAGE OF UTERUS    . ELECTROMAGNETIC NAVIGATION BROCHOSCOPY Right 04/11/2016   Procedure: ELECTROMAGNETIC NAVIGATION BRONCHOSCOPY;  Surgeon: Vilinda Boehringer, MD;  Location: [facility] ORS;  Service: Cardiopulmonary;  Laterality: Right;  . ESOPHAGOGASTRODUODENOSCOPY N/A 07/22/2018   Procedure: ESOPHAGOGASTRODUODENOSCOPY (EGD);  Surgeon: Virgel Manifold, MD;  Location[facility] ENDOSCOPY;  Service: Endoscopy;  Laterality: N/A;  . ESOPHAGOGASTRODUODENOSCOPY (EGD) WITH PROPOFOL N/A 05/07/2018   Procedure: ESOPHAGOGASTRODUODENOSCOPY (EGD) WITH PROPOFOL;  Surgeon: Lucilla Lame, MD;  Location[facility] ENDOSCOPY;  Service: Endoscopy;  Laterality: N/A;  . ESOPHAGOGASTRODUODENOSCOPY (EGD) WITH PROPOFOL N/A 04/24/2019   Procedure: ESOPHAGOGASTRODUODENOSCOPY (EGD) WITH PROPOFOL;  Surgeon: Jonathon Bellows, MD;  Location: [facility] ENDOSCOPY;  Service: Gastroenterology;  Laterality: N/A;  . EUS N/A 05/07/2019   Procedure: FULL UPPER ENDOSCOPIC ULTRASOUND (EUS) RADIAL;  Surgeon: Jola Schmidt, MD;  Location: [facility] ENDOSCOPY;  Service: Endoscopy;  Laterality: N/A;  . history of colonoscopy]    . ILEOSCOPY N/A 07/22/2018   Procedure: ILEOSCOPY THROUGH STOMA;  Surgeon: Virgel Manifold, MD;  Location: [facility] ENDOSCOPY;  Service: Endoscopy;  Laterality: N/A;  . ILEOSTOMY    . ILEOSTOMY  N/A 09/08/2018   Procedure: ILEOSTOMY REVISION POSSIBLE CREATION;  Surgeon: Herbert Pun, MD;  Location: [facility] ORS;  Service: General;  Laterality: N/A;  . ILEOSTOMY CLOSURE N/A 08/15/2018   Procedure: DILATION OF ILEOSTOMY STRICTURE;  Surgeon: Herbert Pun, MD;  Location: [facility] ORS;  Service: General;  Laterality: N/A;  . LAPAROTOMY Right 05/04/2018   Procedure: EXPLORATORY LAPAROTOMY right colectomy right and left ostomy;  Surgeon: Herbert Pun, MD;  Location: [facility] ORS;  Service: General;  Laterality: Right;  . LUNG BIOPSY    . MASTECTOMY Left    2000, [facility]  .  ROTATOR CUFF REPAIR Right    [facility]     Family History: Family History  Problem Relation Age of Onset  . Breast cancer Mother 35  . Cancer Mother        Breast   . Cirrhosis Father   . Breast cancer Paternal Aunt 63  . Cancer Maternal Aunt        Breast      Social History: Social History   Socioeconomic History  . Marital status: Divorced    Spouse name: Not on file  . Number of children: 3  . Years of education: Not on file  . Highest education level: Not on file  Occupational History  . Occupation: Retired   Tobacco Use  . Smoking status: Former Smoker    Packs/day: 0.50    Years: 20.00    Pack years: 10.00    Types: Cigarettes    Quit date: 07/02/2012    Years since quitting: 7.3  . Smokeless tobacco: Current User    Types: Snuff  . Tobacco comment: quit 2014  Substance and Sexual Activity  . Alcohol use: Not Currently    Comment: Occasionally  . Drug use: No  . Sexual activity: Not Currently  Other Topics Concern  . Not on file  Social History Narrative  . Not on file   Social Determinants of Health   Financial Resource Strain:   . Difficulty of Paying Living Expenses: Not on file  Food Insecurity:   . Worried About Charity fundraiser in the Last Year: Not on file  . Ran Out of Food in the Last Year: Not on file  Transportation Needs:   . Lack of Transportation  (Medical): Not on file  . Lack of Transportation (Non-Medical): Not on file  Physical Activity:   . Days of Exercise per Week: Not on file  . Minutes of Exercise per Session: Not on file  Stress:   . Feeling of Stress : Not on file  Social Connections:   . Frequency of Communication with Friends and Family: Not on file  . Frequency of Social Gatherings with Friends and Family: Not on file  . Attends Religious Services: Not on file  . Active Member of Clubs or Organizations: Not on file  . Attends Archivist Meetings: Not on file  . Marital Status: Not on file  Intimate Partner Violence:   . Fear of Current or Ex-Partner: Not on file  . Emotionally Abused: Not on file  . Physically Abused: Not on file  . Sexually Abused: Not on file     Review of Systems: Review of Systems  Constitutional: Negative.   HENT: Negative.   Eyes: Negative.   Respiratory: Negative.   Cardiovascular: Negative.   Gastrointestinal: Positive for diarrhea, heartburn, nausea and vomiting. Negative for abdominal pain, blood in stool, constipation and melena.  Genitourinary: Negative.   Musculoskeletal: Negative.   Skin: Negative.   Neurological: Negative.   Endo/Heme/Allergies: Negative.   Psychiatric/Behavioral: Negative.     Vital Signs: Blood pressure 106/84, pulse 68, temperature 98.2 F (36.8 C), temperature source Oral, resp. rate 20, height 5\' 6"  (1.676 m), weight 51.3 kg, SpO2 98 %.  Weight trends: Filed Weights   10/29/19 1047  Weight: 51.3 kg    Physical Exam: General: NAD,   Head: Normocephalic, atraumatic. Moist oral mucosal membranes  Eyes: Anicteric, PERRL  Neck: Supple, trachea midline  Lungs:  Clear to auscultation  Heart: Regular rate and rhythm  Abdomen:  +ostomy  Extremities:  no peripheral edema.  Neurologic: Nonfocal, moving all four extremities  Skin: No lesions         Lab results: Basic Metabolic Panel: Recent Labs  Lab 10/29/19 0906  10/29/19 1126  NA 133* 130*  K 4.9 5.1  CL 97* 96*  CO2 17* 17*  GLUCOSE 110* 92  BUN 77* 75*  CREATININE 8.41* 8.44*  CALCIUM 9.7 9.5    Liver Function Tests: Recent Labs  Lab 10/29/19 0906  AST 22  ALT 17  ALKPHOS 66  BILITOT 0.6  PROT 9.5*  ALBUMIN 4.5   No results for input(s): LIPASE, AMYLASE in the last 168 hours. No results for input(s): AMMONIA in the last 168 hours.  CBC: Recent Labs  Lab 10/29/19 0906 10/29/19 1126  WBC 8.8 8.0  NEUTROABS 6.7  --   HGB 11.2* 11.0*  HCT 35.2* 34.4*  MCV 96.7 96.6  PLT 415* 406*    Cardiac Enzymes: No results for input(s): CKTOTAL, CKMB, CKMBINDEX, TROPONINI in the last 168 hours.  BNP: Invalid input(s): POCBNP  CBG: No results for input(s): GLUCAP in the last 168 hours.  Microbiology: Results for orders placed or performed during the hospital encounter of 10/14/19  SARS CORONAVIRUS 2 (TAT 6-24 HRS) Nasopharyngeal Nasopharyngeal Swab     Status: Abnormal   Collection Time: 10/14/19  9:34 AM   Specimen: Nasopharyngeal Swab  Result Value Ref Range Status   SARS Coronavirus 2 POSITIVE (A) NEGATIVE Final    Comment: RESULT CALLED TO, READ BACK BY AND VERIFIED WITH: B.ALEJA RN [id number] 10/14/2019 MCCORMICK K (NOTE) SARS-CoV-2 target nucleic acids are DETECTED. The SARS-CoV-2 RNA is generally detectable in upper and lower respiratory specimens during the acute phase of infection. Positive results are indicative of the presence of SARS-CoV-2 RNA. Clinical correlation with patient history and other diagnostic information is  necessary to determine patient infection status. Positive results do not rule out bacterial infection or co-infection with other viruses.  The expected result is Negative. Fact Sheet for Patients: SugarRoll.be Fact Sheet for Healthcare Providers: https://www.woods-mathews.com/ This test is not yet approved or cleared by the [place] FDA and  has been  authorized for detection and/or diagnosis of SARS-CoV-2 by FDA under an Emergency Use Authorization (EUA). This EUA will remain  in effect (meaning this test can be used) for  the duration of the COVID-19 declaration under Section 564(b)(1) of the Act, 21 U.S.C. section 360bbb-3(b)(1), unless the authorization is terminated or revoked sooner. Performed at [facility], [place] [address] [place], [place] [address]     Coagulation Studies: No results for input(s): LABPROT, INR in the last 72 hours.  Urinalysis: No results for input(s): COLORURINE, LABSPEC, PHURINE, GLUCOSEU, HGBUR, BILIRUBINUR, KETONESUR, PROTEINUR, UROBILINOGEN, NITRITE, LEUKOCYTESUR in the last 72 hours.  Invalid input(s): APPERANCEUR    Imaging: CT Renal Stone Study  Result Date: 10/29/2019 CLINICAL DATA:  Acute renal failure. Right-sided back pain and dizziness. Lung cancer and breast cancer. EXAM: CT ABDOMEN AND PELVIS WITHOUT CONTRAST TECHNIQUE: Multidetector CT imaging of the abdomen and pelvis was performed following the standard protocol without IV contrast. COMPARISON:  09/27/2019 renal ultrasound. Abdominopelvic CT of 04/29/2019 FINDINGS: Lower chest: Clear lung bases. Normal heart size without pericardial or pleural effusion. Multivessel coronary artery atherosclerosis. Hepatobiliary: Hepatic cysts. Normal gallbladder, without biliary ductal dilatation. Pancreas: Mild pancreatic atrophy, without duct dilatation or acute inflammation. Spleen: Normal in size, without focal abnormality. Adrenals/Urinary Tract: Normal adrenal glands. No renal calculi or hydronephrosis. No hydroureter or  ureteric calculi. No bladder calculi. Stomach/Bowel: Normal stomach, without wall thickening. Moderate amount of stool within the rectum. Scattered colonic diverticula. Left abdominal mucous fistula. Right abdominal end colostomy. Normal terminal ileum. Normal small bowel caliber. Vascular/Lymphatic: Advanced aortic and branch vessel  atherosclerosis. No abdominopelvic adenopathy. Reproductive: Normal uterus and adnexa. Other: No significant free fluid. Mild pelvic floor laxity. No evidence of omental or peritoneal disease. Nonspecific subcutaneous nodule about the anterior right abdominal wall at 8 mm on 14/2, new. There is also subcutaneous nodule superficial the right gluteals of 11 mm on 29/2, new. Musculoskeletal: Mild osteopenia. L3 and L5 mild superior endplate compression deformities, similar to on the prior. This presumes a transitional S1 vertebral body. IMPRESSION: 1.  No urinary tract calculi or hydronephrosis. 2. Otherwise, low sensitivity exam secondary to stone study technique. 3. L3 and L5 mild superior endplate compression deformities, chronic. 4. Extensive surgical changes within the colon, without acute complication. 5. Coronary artery atherosclerosis. Aortic Atherosclerosis (ICD10-I70.0). 6. New subcutaneous nodularity about the abdomen and right gluteal region. Correlate with sites of injections and consider physical exam. Electronically Signed   By: Abigail Miyamoto M.D.   On: 10/29/2019 14:06      Assessment & Plan: Danielle [name] is a 64 y.o. 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 Acute renal failure with metabolic acidosis on chronic kidney disease stage IIIB. Baseline creatinine of 1.7, GFR of 36 on 10/21/19.  Chronic kidney disease secondary to hypertension Acute renal failure seems to be secondary to prerenal azotemia.  - No acute indication for dialysis  - Start sodium bicarb infusion  2. Hypertension: holding carvedilol  3. Anemia with renal failure: hemoglobin 11.   LOS: 0 Jakob Kimberlin 1/14/20216:50 PM

## 2019-10-29 NOTE — Assessment & Plan Note (Addendum)
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  Currently s/p definitive radiation [dec 7th 2020]. Clinically stable. Will plan PET scan after evaluation/discharge from hospital-see below..   # Acute renal failure- baseline GFR ~30; now 5; ? Increased ostomy out put.  needs urgent evaluation in ER/admission to hospital.  # Hypotension-systolic 43P to 29J.? Pre-renal vs. Sepsis.- HOLD Anti-HTN/lasix. Needs further work-up with blood cultures/infectious work-up.  # Weight loss-multifactorial; esophagus malignancy/radiation- see above.  # ? ACTIVE MULTIPLE MYELOMA-hemoglobin today is 11 given the 5% plasma cells/renal failure [no hypercalcemia or bone lesions on recent PET scan]. Clinically less likely to be active myeloma. Await M protein kappa lambda light chain ratio from today.  # I spoke to pt's daughter, [name] King-given the seriousness of the nature patient is to be evaluated in the emergency room.  # DISPOSITION: # ER for urgent evaluation re: acute renal failure # follow up TBD-Dr.B

## 2019-10-29 NOTE — H&P (Signed)
History and Physical    Danielle [name]:[id number] DOB: 1955-11-01 DOA: 10/29/2019  Referring MD/NP/PA:   PCP: [facility]   Patient coming from:  The patient is coming from home.  At baseline, pt is independent for most of ADL.        Chief Complaint: Abdominal cramps and right sided back pain  HPI: Danielle [name] is a 64 y.o. female with medical history significant of hypertension, COPD, GERD, depression with anxiety, breast cancer (mastectomy, chemo and radiation therapy), lung cancer (radiation therapy), CHF with EF 35-40%, iron deficiency anemia, CKD-3b, ileostomy, positive covid 19 Ag on 12/13 and positive covid 19 PCR on 12/30,  who presents with abdominal cramps and right sided back pain.  Pt states that she has not been feeling well in the past several days.  She states that she has abdominal cramps, no nausea, vomiting or diarrhea.  She also reports right-sided back pain.  She has poor appetite and decreased oral intake recently.  Patient does not have chest pain, shortness of breath, cough, fever or chills.  No symptoms of UTI.  He has generalized weakness. Pt was seen in [facility] clinic. She was found to have worsening renal function, and sent to ED for further evaluation and treatment.   ED Course: pt was found to have worsening kidney function with creatinine up from 1.7 on 10/21/2019 to 8.44, BUN 75.  WBC 8.0.  Potassium 5.1.  Temperature 95.9, 98.2, soft blood pressure, oxygen saturation 98% on room air.  CT per renal stone protocol is negative for obstruction or hydronephrosis.  Patient is admitted to [place] bed as inpatient.  Renal, Dr. Juleen China was consulted.  Review of Systems:   General: no fevers, chills, no body weight gain, has poor appetite, has fatigue HEENT: no blurry vision, hearing changes or sore throat Respiratory: no dyspnea, coughing, wheezing CV: no chest pain, no palpitations GI: no nausea, vomiting, has abdominal cramps, diarrhea,  constipation GU: no dysuria, burning on urination, increased urinary frequency, hematuria  Ext: no leg edema Neuro: no unilateral weakness, numbness, or tingling, no vision change or hearing loss Skin: no rash, no skin tear. MSK: No muscle spasm, no deformity, no limitation of range of movement in spin. Has right sided back pain Heme: No easy bruising.  Travel history: No recent long distant travel.  Allergy: No Known Allergies  Past Medical History:  Diagnosis Date  . Anxiety   . Arthritis   . Breast cancer ([place]) 2000  . Cancer ([place]) left    breast cancer 2000, chemo tx's with total mastectomy and lymph nodes resected.   . Cancer of right lung ([place]) 02/21/2016   rad tx's.   . CHF (congestive heart failure) ([place])   . COPD (chronic obstructive pulmonary disease) ([place])   . Dependence on supplemental oxygen   . Depression   . Heart murmur   . Hypertension   . Lung nodule   . Lymphedema   . Personal history of chemotherapy   . Personal history of radiation therapy   . Shortness of breath dyspnea    with exertion  . Status post chemotherapy 2001   left breast cancer  . Status post radiation therapy 2001   left breast cancer    Past Surgical History:  Procedure Laterality Date  . Breast Biospy Left    [facility]  . BREAST SURGERY    . COLONOSCOPY N/A 04/30/2018   Procedure: COLONOSCOPY;  Surgeon: Virgel Manifold, MD;  Location: [facility]  ENDOSCOPY;  Service: Endoscopy;  Laterality: N/A;  . COLONOSCOPY N/A 07/22/2018   Procedure: COLONOSCOPY;  Surgeon: Virgel Manifold, MD;  Location: [facility] ENDOSCOPY;  Service: Endoscopy;  Laterality: N/A;  . DILATION AND CURETTAGE OF UTERUS    . ELECTROMAGNETIC NAVIGATION BROCHOSCOPY Right 04/11/2016   Procedure: ELECTROMAGNETIC NAVIGATION BRONCHOSCOPY;  Surgeon: Vilinda Boehringer, MD;  Location: [facility] ORS;  Service: Cardiopulmonary;  Laterality: Right;  . ESOPHAGOGASTRODUODENOSCOPY N/A 07/22/2018   Procedure: ESOPHAGOGASTRODUODENOSCOPY (EGD);   Surgeon: Virgel Manifold, MD;  Location: [facility] ENDOSCOPY;  Service: Endoscopy;  Laterality: N/A;  . ESOPHAGOGASTRODUODENOSCOPY (EGD) WITH PROPOFOL N/A 05/07/2018   Procedure: ESOPHAGOGASTRODUODENOSCOPY (EGD) WITH PROPOFOL;  Surgeon: Lucilla Lame, MD;  Location: [facility] ENDOSCOPY;  Service: Endoscopy;  Laterality: N/A;  . ESOPHAGOGASTRODUODENOSCOPY (EGD) WITH PROPOFOL N/A 04/24/2019   Procedure: ESOPHAGOGASTRODUODENOSCOPY (EGD) WITH PROPOFOL;  Surgeon: Jonathon Bellows, MD;  Location: [facility] ENDOSCOPY;  Service: Gastroenterology;  Laterality: N/A;  . EUS N/A 05/07/2019   Procedure: FULL UPPER ENDOSCOPIC ULTRASOUND (EUS) RADIAL;  Surgeon: Jola Schmidt, MD;  Location: [facility] ENDOSCOPY;  Service: Endoscopy;  Laterality: N/A;  . history of colonoscopy]    . ILEOSCOPY N/A 07/22/2018   Procedure: ILEOSCOPY THROUGH STOMA;  Surgeon: Virgel Manifold, MD;  Location: [facility] ENDOSCOPY;  Service: Endoscopy;  Laterality: N/A;  . ILEOSTOMY    . ILEOSTOMY N/A 09/08/2018   Procedure: ILEOSTOMY REVISION POSSIBLE CREATION;  Surgeon: Herbert Pun, MD;  Location: [facility] ORS;  Service: General;  Laterality: N/A;  . ILEOSTOMY CLOSURE N/A 08/15/2018   Procedure: DILATION OF ILEOSTOMY STRICTURE;  Surgeon: Herbert Pun, MD;  Location: [facility] ORS;  Service: General;  Laterality: N/A;  . LAPAROTOMY Right 05/04/2018   Procedure: EXPLORATORY LAPAROTOMY right colectomy right and left ostomy;  Surgeon: Herbert Pun, MD;  Location: [facility] ORS;  Service: General;  Laterality: Right;  . LUNG BIOPSY    . MASTECTOMY Left    2000, [facility]  . ROTATOR CUFF REPAIR Right    [facility]    Social History:  reports that she quit smoking about 7 years ago. Her smoking use included cigarettes. She has a 10.00 pack-year smoking history. Her smokeless tobacco use includes snuff. She reports previous alcohol use. She reports that she does not use drugs.  Family History:  Family History  Problem Relation Age of Onset  . Breast cancer  Mother 42  . Cancer Mother        Breast   . Cirrhosis Father   . Breast cancer Paternal Aunt 53  . Cancer Maternal Aunt        Breast      Prior to Admission medications   Medication Sig Start Date End Date Taking? Authorizing Provider  acetaminophen (TYLENOL) 325 MG tablet Take 2 tablets (650 mg total) by mouth every 6 (six) hours as needed for mild pain (or Fever >/= 101). 12/24/18   Gouru, Illene Silver, MD  albuterol (PROVENTIL HFA;VENTOLIN HFA) 108 (90 Base) MCG/ACT inhaler Inhale 2 puffs into the lungs every 6 (six) hours as needed for wheezing or shortness of breath. 04/24/18   Demetrios Loll, MD  calcium-vitamin D (OSCAL WITH D) 250-125 MG-UNIT tablet Take 1 tablet by mouth daily.    [provider]  carvedilol (COREG) 3.125 MG tablet Take 3.125 mg by mouth 2 (two) times daily with a meal.    [provider]  citalopram (CELEXA) 20 MG tablet Take 40 mg by mouth daily.     [provider]  ferrous sulfate 325 (65 FE) MG tablet Take 1 tablet (  325 mg total) by mouth 2 (two) times daily with a meal. 07/02/18   [name], MD  Fluticasone-Umeclidin-Vilant 100-62.5-25 MCG/INH AEPB Inhale 1 puff into the lungs daily.  01/07/19   [provider]  furosemide (LASIX) 20 MG tablet Take 1 tablet (20 mg total) by mouth daily. And additional 35m dose as needed Patient taking differently: Take 20-40 mg by mouth See admin instructions. Take 1 tablet (270m by mouth daily - may take a second tablet (2025mby mouth daily as needed for severe swelling 06/15/19   HacDarylene Price FNP  guaiFENesin-dextromethorphan (ROBITUSSIN DM) 100-10 MG/5ML syrup Take 5 mLs by mouth every 4 (four) hours as needed for cough.    [provider]  loperamide (IMODIUM A-D) 2 MG tablet Take 1 tablet (2 mg total) by mouth as needed for diarrhea or loose stools. 10/04/18   CinHerbert PunD  Multiple Vitamin (MULTIVITAMIN WITH MINERALS) TABS tablet Take 1 tablet by mouth daily.  06/06/18   PatFritzi MandesD  Nutritional Supplements (FEEDING SUPPLEMENT, NEPRO CARB STEADY,) LIQD Take 237 mLs by mouth 3 (three) times daily between meals. 10/21/19   WieLoletha GrayerD  pantoprazole (PROTONIX) 40 MG tablet Take 1 tablet (40 mg total) by mouth 2 (two) times daily before a meal. 07/23/18   Sudini, SriAlveta HeimlichD  sodium bicarbonate 650 MG tablet Take 2 tablets (1,300 mg total) by mouth 3 (three) times daily. 10/21/19   WieLoletha GrayerD  sucralfate (CARAFATE) 1 GM/10ML suspension Take 10 mLs (1 g total) by mouth 4 (four) times daily. 08/18/19   ChrNoreene FilbertD    Physical Exam: Vitals:   10/29/19 1047 10/29/19 1130 10/29/19 1419  BP:  (!) 96/58 106/84  Pulse:  94 68  Resp:  20 20  Temp:  98.2 F (36.8 C)   TempSrc:  Oral   SpO2:  98% 98%  Weight: 51.3 kg    Height: _0  (1.676 m)     General: Not in acute distress HEENT:       Eyes: PERRL, EOMI, no scleral icterus.       ENT: No discharge from the ears and nose, no pharynx injection, no tonsillar enlargement.        Neck: No JVD, no bruit, no mass felt. Heme: No neck lymph node enlargement. Cardiac: S1/S2, RRR, No murmurs, No gallops or rubs. Respiratory: No rales, wheezing, rhonchi or rubs. GI: Soft, nondistended, nontender, no rebound pain, no organomegaly, BS present. S/p of ileostomy GU: No hematuria Ext: No pitting leg edema bilaterally. 2+DP/PT pulse bilaterally. Musculoskeletal: No joint deformities, No joint redness or warmth, no limitation of ROM in spin. Skin: No rashes.  Neuro: Alert, oriented X3, cranial nerves II-XII grossly intact, moves all extremities normally.  Psych: Patient is not psychotic, no suicidal or hemocidal ideation.  Labs on Admission: I have personally reviewed following labs and imaging studies  CBC: Recent Labs  Lab 10/29/19 0906 10/29/19 1126  WBC 8.8 8.0  NEUTROABS 6.7  --   HGB 11.2* 11.0*  HCT 35.2* 34.4*  MCV 96.7 96.6  PLT 415* [id number] Basic Metabolic  Panel: Recent Labs  Lab 10/29/19 0906 10/29/19 1126  NA 133* 130*  K 4.9 5.1  CL 97* 96*  CO2 17* 17*  GLUCOSE 110* 92  BUN 77* 75*  CREATININE 8.41* 8.44*  CALCIUM 9.7 9.5   GFR: Estimated Creatinine Clearance: 5.4 mL/min (A) (by C-G formula based on SCr of 8.44 mg/dL (H)). Liver Function Tests: Recent  Labs  Lab 10/29/19 0906  AST 22  ALT 17  ALKPHOS 66  BILITOT 0.6  PROT 9.5*  ALBUMIN 4.5   No results for input(s): LIPASE, AMYLASE in the last 168 hours. No results for input(s): AMMONIA in the last 168 hours. Coagulation Profile: No results for input(s): INR, PROTIME in the last 168 hours. Cardiac Enzymes: No results for input(s): CKTOTAL, CKMB, CKMBINDEX, TROPONINI in the last 168 hours. BNP (last 3 results) No results for input(s): PROBNP in the last 8760 hours. HbA1C: No results for input(s): HGBA1C in the last 72 hours. CBG: No results for input(s): GLUCAP in the last 168 hours. Lipid Profile: No results for input(s): CHOL, HDL, LDLCALC, TRIG, CHOLHDL, LDLDIRECT in the last 72 hours. Thyroid Function Tests: No results for input(s): TSH, T4TOTAL, FREET4, T3FREE, THYROIDAB in the last 72 hours. Anemia Panel: No results for input(s): VITAMINB12, FOLATE, FERRITIN, TIBC, IRON, RETICCTPCT in the last 72 hours. Urine analysis:    Component Value Date/Time   COLORURINE YELLOW (A) 09/28/2019 0225   APPEARANCEUR HAZY (A) 09/28/2019 0225   APPEARANCEUR Clear 12/06/2013 2245   LABSPEC 1.018 09/28/2019 0225   LABSPEC 1.015 12/06/2013 2245   PHURINE 5.0 09/28/2019 0225   GLUCOSEU NEGATIVE 09/28/2019 0225   GLUCOSEU Negative 12/06/2013 2245   HGBUR NEGATIVE 09/28/2019 0225   BILIRUBINUR NEGATIVE 09/28/2019 0225   BILIRUBINUR Negative 12/06/2013 2245   KETONESUR NEGATIVE 09/28/2019 0225   PROTEINUR 30 (A) 09/28/2019 0225   NITRITE NEGATIVE 09/28/2019 0225   LEUKOCYTESUR NEGATIVE 09/28/2019 0225   LEUKOCYTESUR Negative 12/06/2013 2245   Sepsis  Labs: _0 (procalcitonin:4,lacticidven:4) )No results found for this or any previous visit (from the past 240 hour(s)).   Radiological Exams on Admission: CT Renal Stone Study  Result Date: 10/29/2019 CLINICAL DATA:  Acute renal failure. Right-sided back pain and dizziness. Lung cancer and breast cancer. EXAM: CT ABDOMEN AND PELVIS WITHOUT CONTRAST TECHNIQUE: Multidetector CT imaging of the abdomen and pelvis was performed following the standard protocol without IV contrast. COMPARISON:  09/27/2019 renal ultrasound. Abdominopelvic CT of 04/29/2019 FINDINGS: Lower chest: Clear lung bases. Normal heart size without pericardial or pleural effusion. Multivessel coronary artery atherosclerosis. Hepatobiliary: Hepatic cysts. Normal gallbladder, without biliary ductal dilatation. Pancreas: Mild pancreatic atrophy, without duct dilatation or acute inflammation. Spleen: Normal in size, without focal abnormality. Adrenals/Urinary Tract: Normal adrenal glands. No renal calculi or hydronephrosis. No hydroureter or ureteric calculi. No bladder calculi. Stomach/Bowel: Normal stomach, without wall thickening. Moderate amount of stool within the rectum. Scattered colonic diverticula. Left abdominal mucous fistula. Right abdominal end colostomy. Normal terminal ileum. Normal small bowel caliber. Vascular/Lymphatic: Advanced aortic and branch vessel atherosclerosis. No abdominopelvic adenopathy. Reproductive: Normal uterus and adnexa. Other: No significant free fluid. Mild pelvic floor laxity. No evidence of omental or peritoneal disease. Nonspecific subcutaneous nodule about the anterior right abdominal wall at 8 mm on 14/2, new. There is also subcutaneous nodule superficial the right gluteals of 11 mm on 29/2, new. Musculoskeletal: Mild osteopenia. L3 and L5 mild superior endplate compression deformities, similar to on the prior. This presumes a transitional S1 vertebral body. IMPRESSION: 1.  No urinary tract calculi  or hydronephrosis. 2. Otherwise, low sensitivity exam secondary to stone study technique. 3. L3 and L5 mild superior endplate compression deformities, chronic. 4. Extensive surgical changes within the colon, without acute complication. 5. Coronary artery atherosclerosis. Aortic Atherosclerosis (ICD10-I70.0). 6. New subcutaneous nodularity about the abdomen and right gluteal region. Correlate with sites of injections and consider physical exam. Electronically Signed  By: Abigail Miyamoto M.D.   On: 10/29/2019 14:06     EKG: Independently reviewed.  Sinus rhythm, QTC 583, low voltage, PVC.  Assessment/Plan Principal Problem:   Acute renal failure superimposed on stage 3b chronic kidney disease (HCC) Active Problems:   Moderate COPD (chronic obstructive pulmonary disease) (HCC)   Chronic systolic heart failure (HCC)   HTN (hypertension)   Iron deficiency anemia secondary to blood loss (chronic)   GERD (gastroesophageal reflux disease)   Depression   Acute renal failure superimposed on stage 3b chronic kidney disease ([place]): Etiology is not clear. CT scan showed no obstruction or hydronephrosis.  Renal, Dr. Juleen China was consulted.  -will admit to med-surg bed as inpt -IVF: 500 cc of NS in ED -Bicarbonate drip at 75 cc/h per renal -Lab: Multiple myeloma panel, kappa/lambda light chain -FeUrea -f/u by BMP -Hold Lasix  Moderate COPD (chronic obstructive pulmonary disease) ([place]): -Continue bronchodilators.  Depression:  No SI or HI -continue home meds  Chronic systolic heart failure ([place]): 2D echo on 04/21/2018 showed EF 35-40%.  Patient does not have leg edema or JVD CHF is compensated. -Hold Lasix due to worsening renal function   HTN (hypertension): Blood pressures are soft -Hold Lasix and Coreg  Iron deficiency anemia secondary to blood loss (chronic): Hemoglobin 11.0. -Continue iron supplement  GERD (gastroesophageal reflux disease):  -PPI  Inpatient status:  # Patient requires  inpatient status due to high intensity of service, high risk for further deterioration and high frequency of surveillance required.  I certify that at the point of admission it is my clinical judgment that the patient will require inpatient hospital care spanning beyond 2 midnights from the point of admission.   This patient has multiple chronic comorbidities including  hypertension, COPD, GERD, depression with anxiety, breast cancer (mastectomy, chemo and radiation therapy), lung cancer (radiation therapy), CHF with EF 35-40%, iron deficiency anemia, CKD-4, ileostomy . Now patient has presenting with AoCKD-IV, with creatinine up from 1.7 on 10/21/2019 to 8.44, BUN 75. . The initial radiographic and laboratory data are worrisome because of worsening renal function . Current medical needs: please see my assessment and plan . Predictability of an adverse outcome (risk): Patient has multiple comorbidities as listed above. Now presents with AoCKD-IV, with creatinine up from 1.7 on 10/21/2019 to 8.44.  Etiology is not clear. Patient's presentation is highly complicated.  Patient is at high risk of deteriorating.  Will need to be treated in hospital for at least 2 days.       DVT ppx: SQ Heparin  Code Status: Full code Family Communication: None at bed side.   Disposition Plan:  Anticipate discharge back to previous home environment Consults called:  Renal, Dr. Juleen China Admission status: Med-surg bed as inpt     Date of Service 10/29/2019    [facility]   If 7PM-7AM, please contact night-coverage [email or web address] Password [facility] 10/29/2019, 7:04 PM

## 2019-10-29 NOTE — Progress Notes (Signed)
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 A. LUNG MASS, RIGHT; CT-GUIDED BIOPSY: - ADENOCARCINOMA, ACINAR AND PAPILLARY MORPHOLOGIES. S/p RT [finished Dec 2017] s/p RT [DEC 2017]; AUG 2019- PET-right middle lobe radiation changes no evidence of recurrence.  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  T1b- submucosal involvement; N0- stage I [[name]; [facility]]; unable to proceed with Endo mucosal resection-because of extensive disease [Dr.Branch; [facility]]; recommend definitive radiation [finished  RT- 09/21/2019]  #September 2020 worsening macrocytic anemia--bone marrow biopsy shows plasma cells up to 5%; unlikely to explain patient's anemia.  MDS is still a possibility-if anemia worsens would recommend peripheral blood NGS.   # 2000-  Left breast cancer [T3N1- stage III] mastecomy s/p RT; s/p chemo; Tamoxifen  # NOV 111 2017- Mol testing [RML- adeno ca]  # 2019-July-aug 2019-ischemic colitis status post right hemicolectomy [Dr.[name]]; acute renal failure;   -----------------------------------------------------------------    DIAGNOSIS: RML Lung ca adeno [stage I] #squamous cell carcinoma esophagus -stage I   CURRENT/MOST RECENT THERAPY: Status post radiation       Breast cancer in female [facility])  07/24/1999 Initial Diagnosis   Breast cancer in female ([place]) T3 N1 M0 tumor ER/PR positive   08/24/1999 -  Anti-estrogen oral therapy   Started tamoxifen   08/24/1999 Surgery   Status post modified radical mastectomy of left breast    Chemotherapy      Radiation Therapy     11/24/2003 - 02/20/2009 Anti-estrogen oral therapy   Started Aromasin   Carcinoma of overlapping sites of left breast in female, estrogen receptor positive ([place])  06/22/2016 Initial Diagnosis   Cancer of overlapping sites of left female breast ([place])   Malignant neoplasm of upper lobe of right lung ([place])  Malignant neoplasm of lower third of esophagus ([place])  05/01/2019 Initial Diagnosis   Malignant neoplasm of lower third of esophagus ([place])     INTERVAL HISTORY:  Danielle [name] 64 y.o.  female pleasant patient above history of multiple malignancies including most recent stage I squamous cell cancer of esophagus currently status post radiation; and history of severe anemia is here for follow-up.  Patient was recently tested positive for Covid; she was admitted to hospital. Patient was also noted to have worsening renal failure; needing evaluation by nephrology. Patient not need any dialysis.  Patient notes to have increased ostomy output over the last 1 to 2 weeks. She feels poorly. She feels dizzy on standing. Poor appetite. No fevers or chills.    Review of Systems  Constitutional: Positive for malaise/fatigue and weight loss. Negative for chills, diaphoresis and fever.  HENT: Negative for nosebleeds and sore  throat.   Eyes: Negative for double vision.  Respiratory: Positive for cough and shortness of breath. Negative for hemoptysis, sputum production and  wheezing.   Cardiovascular: Negative for chest pain, palpitations, orthopnea and leg swelling.  Gastrointestinal: Positive for diarrhea and nausea. Negative for abdominal pain, blood in stool, constipation, heartburn, melena and vomiting.       Dysphagia.  Genitourinary: Negative for dysuria, frequency and urgency.  Musculoskeletal: Negative for back pain and joint pain.  Skin: Negative.  Negative for itching and rash.  Neurological: Positive for dizziness. Negative for tingling, focal weakness, weakness and headaches.  Endo/Heme/Allergies: Does not bruise/bleed easily.  Psychiatric/Behavioral: Negative for depression. The patient is not nervous/anxious and does not have insomnia.       PAST MEDICAL HISTORY :  Past Medical History:  Diagnosis Date  . Anxiety   . Arthritis   . Breast cancer ([place]) 2000  . Cancer ([place]) left    breast cancer 2000, chemo tx's with total mastectomy and lymph nodes resected.   . Cancer of right lung ([place]) 02/21/2016   rad tx's.   . CHF (congestive heart failure) ([place])   . COPD (chronic obstructive pulmonary disease) ([place])   . Dependence on supplemental oxygen   . Depression   . Heart murmur   . Hypertension   . Lung nodule   . Lymphedema   . Personal history of chemotherapy   . Personal history of radiation therapy   . Shortness of breath dyspnea    with exertion  . Status post chemotherapy 2001   left breast cancer  . Status post radiation therapy 2001   left breast cancer    PAST SURGICAL HISTORY :   Past Surgical History:  Procedure Laterality Date  . Breast Biospy Left    [facility]  . BREAST SURGERY    . COLONOSCOPY N/A 04/30/2018   Procedure: COLONOSCOPY;  Surgeon: Virgel Manifold, MD;  Location: [facility] ENDOSCOPY;  Service: Endoscopy;  Laterality: N/A;  . COLONOSCOPY N/A 07/22/2018   Procedure: COLONOSCOPY;  Surgeon: Virgel Manifold, MD;  Location: [facility] ENDOSCOPY;  Service: Endoscopy;  Laterality: N/A;  . DILATION AND CURETTAGE OF  UTERUS    . ELECTROMAGNETIC NAVIGATION BROCHOSCOPY Right 04/11/2016   Procedure: ELECTROMAGNETIC NAVIGATION BRONCHOSCOPY;  Surgeon: Vilinda Boehringer, MD;  Location: [facility] ORS;  Service: Cardiopulmonary;  Laterality: Right;  . ESOPHAGOGASTRODUODENOSCOPY N/A 07/22/2018   Procedure: ESOPHAGOGASTRODUODENOSCOPY (EGD);  Surgeon: Virgel Manifold, MD;  Location[facility] ENDOSCOPY;  Service: Endoscopy;  Laterality: N/A;  . ESOPHAGOGASTRODUODENOSCOPY (EGD) WITH PROPOFOL N/A 05/07/2018   Procedure: ESOPHAGOGASTRODUODENOSCOPY (EGD) WITH PROPOFOL;  Surgeon: Lucilla Lame, MD;  Location[facility] ENDOSCOPY;  Service: Endoscopy;  Laterality: N/A;  . ESOPHAGOGASTRODUODENOSCOPY (EGD) WITH PROPOFOL N/A 04/24/2019   Procedure: ESOPHAGOGASTRODUODENOSCOPY (EGD) WITH PROPOFOL;  Surgeon: Jonathon Bellows, MD;  Location[facility] ENDOSCOPY;  Service: Gastroenterology;  Laterality: N/A;  . EUS N/A 05/07/2019   Procedure: FULL UPPER ENDOSCOPIC ULTRASOUND (EUS) RADIAL;  Surgeon: Jola Schmidt, MD;  Location: [facility] ENDOSCOPY;  Service: Endoscopy;  Laterality: N/A;  . history of colonoscopy]    . ILEOSCOPY N/A 07/22/2018   Procedure: ILEOSCOPY THROUGH STOMA;  Surgeon: Virgel Manifold, MD;  Location: [facility] ENDOSCOPY;  Service: Endoscopy;  Laterality: N/A;  . ILEOSTOMY    . ILEOSTOMY N/A 09/08/2018   Procedure: ILEOSTOMY REVISION POSSIBLE CREATION;  Surgeon: Herbert Pun, MD;  Location: [facility] ORS;  Service: General;  Laterality: N/A;  . ILEOSTOMY CLOSURE N/A 08/15/2018   Procedure: DILATION OF ILEOSTOMY STRICTURE;  Surgeon: Herbert Pun,  MD;  Location: [facility] ORS;  Service: General;  Laterality: N/A;  . LAPAROTOMY Right 05/04/2018   Procedure: EXPLORATORY LAPAROTOMY right colectomy right and left ostomy;  Surgeon: Herbert Pun, MD;  Location: [facility] ORS;  Service: General;  Laterality: Right;  . LUNG BIOPSY    . MASTECTOMY Left    2000, [facility]  . ROTATOR CUFF REPAIR Right    [facility]    FAMILY HISTORY :   Family History   Problem Relation Age of Onset  . Breast cancer Mother 76  . Cancer Mother        Breast   . Cirrhosis Father   . Breast cancer Paternal Aunt 85  . Cancer Maternal Aunt        Breast     SOCIAL HISTORY:   Social History   Tobacco Use  . Smoking status: Former Smoker    Packs/day: 0.50    Years: 20.00    Pack years: 10.00    Types: Cigarettes    Quit date: 07/02/2012    Years since quitting: 7.3  . Smokeless tobacco: Current User    Types: Snuff  . Tobacco comment: quit 2014  Substance Use Topics  . Alcohol use: Not Currently    Comment: Occasionally  . Drug use: No    ALLERGIES:  has No Known Allergies.  MEDICATIONS:  No current facility-administered medications for this visit.   No current outpatient medications on file.   Facility-Administered Medications Ordered in Other Visits  Medication Dose Route Frequency Provider Last Rate Last Admin  . acetaminophen (TYLENOL) tablet 650 mg  650 mg Oral Q6H PRN Ivor Costa, MD   650 mg at 10/29/19 2220  . albuterol (VENTOLIN HFA) 108 (90 Base) MCG/ACT inhaler 2 puff  2 puff Inhalation Q6H PRN Ivor Costa, MD      . calcium-vitamin D (OSCAL WITH D) 500-200 MG-UNIT per tablet 0.5 tablet  0.5 tablet Oral Daily Ivor Costa, MD      . feeding supplement (NEPRO CARB STEADY) liquid 237 mL  237 mL Oral TID BM Ivor Costa, MD      . ferrous sulfate tablet 325 mg  325 mg Oral BID WC Ivor Costa, MD      . fluticasone furoate-vilanterol (BREO ELLIPTA) 100-25 MCG/INH 1 puff  1 puff Inhalation Daily Lu Duffel, RPH      . guaiFENesin-dextromethorphan (ROBITUSSIN DM) 100-10 MG/5ML syrup 5 mL  5 mL Oral Q4H PRN Ivor Costa, MD      . heparin injection 5,000 Units  5,000 Units Subcutaneous Q8H Ivor Costa, MD   5,000 Units at 10/29/19 2222  . hydrOXYzine (ATARAX/VISTARIL) tablet 10 mg  10 mg Oral TID PRN Ivor Costa, MD      . loperamide (IMODIUM) capsule 2 mg  2 mg Oral PRN Ivor Costa, MD      . multivitamin with minerals tablet 1 tablet   1 tablet Oral Daily Ivor Costa, MD      . pantoprazole (PROTONIX) EC tablet 40 mg  40 mg Oral BID AC Ivor Costa, MD      . sodium bicarbonate 150 mEq in dextrose 5% 1000 mL infusion  150 mEq Intravenous Continuous Kolluru, Lurena Nida, MD 75 mL/hr at 10/29/19 1938 150 mEq at 10/29/19 1938  . sodium bicarbonate tablet 1,300 mg  1,300 mg Oral TID Ivor Costa, MD   1,300 mg at 10/29/19 2220  . sucralfate (CARAFATE) 1 GM/10ML suspension 1 g  1 g Oral QID Ivor Costa, MD      .  umeclidinium bromide (INCRUSE ELLIPTA) 62.5 MCG/INH 1 puff  1 puff Inhalation Daily Shanlever, Pierce Crane, RPH        PHYSICAL EXAMINATION: ECOG PERFORMANCE STATUS: 1 - Symptomatic but completely ambulatory  BP (!) 80/62 (BP Location: Left Arm, Patient Position: Sitting, Cuff Size: Small) Comment: manual  Pulse (!) 50   Temp (!) 95.9 F (35.5 C) (Tympanic)   Wt 113 lb (51.3 kg)   SpO2 99% Comment: room air  BMI 18.24 kg/m   Filed Weights   10/29/19 0934  Weight: 113 lb (51.3 kg)    Physical Exam  Constitutional: She is oriented to person, place, and time.  Thin built moderately nourished female patient.  She is walking herself.  She is alone.  HENT:  Head: Normocephalic and atraumatic.  Mouth/Throat: Oropharynx is clear and moist. No oropharyngeal exudate.  Eyes: Pupils are equal, round, and reactive to light.  Cardiovascular: Normal rate and regular rhythm.  Pulmonary/Chest: No respiratory distress. She has no wheezes.  Decreased breath sounds bilaterally.  No wheeze or crackles.  Abdominal: Soft. Bowel sounds are normal. She exhibits no distension and no mass. There is no abdominal tenderness. There is no rebound and no guarding.  Musculoskeletal:        General: No tenderness or edema. Normal range of motion.     Cervical back: Normal range of motion and neck supple.  Neurological: She is alert and oriented to person, place, and time.  Skin: Skin is warm.  Psychiatric: Affect normal.       LABORATORY DATA:   I have reviewed the data as listed    Component Value Date/Time   NA 134 (L) 10/30/2019 0435   NA 132 (L) 02/09/2015 1100   K 3.7 10/30/2019 0435   K 3.8 02/09/2015 1100   CL 97 (L) 10/30/2019 0435   CL 95 (L) 02/09/2015 1100   CO2 22 10/30/2019 0435   CO2 29 02/09/2015 1100   GLUCOSE 103 (H) 10/30/2019 0435   GLUCOSE 105 (H) 02/09/2015 1100   BUN 76 (H) 10/30/2019 0435   BUN 16 02/09/2015 1100   CREATININE 6.42 (H) 10/30/2019 0435   CREATININE 0.81 02/09/2015 1100   CALCIUM 8.6 (L) 10/30/2019 0435   CALCIUM 9.1 02/09/2015 1100   PROT 9.5 (H) 10/29/2019 0906   PROT 7.7 02/09/2015 1100   ALBUMIN 4.5 10/29/2019 0906   ALBUMIN 4.3 02/09/2015 1100   AST 22 10/29/2019 0906   AST 29 02/09/2015 1100   ALT 17 10/29/2019 0906   ALT 20 02/09/2015 1100   ALKPHOS 66 10/29/2019 0906   ALKPHOS 69 02/09/2015 1100   BILITOT 0.6 10/29/2019 0906   BILITOT 0.9 02/09/2015 1100   GFRNONAA 6 (L) 10/30/2019 0435   GFRNONAA >60 02/09/2015 1100   GFRAA 7 (L) 10/30/2019 0435   GFRAA >60 02/09/2015 1100    No results found for: SPEP, UPEP  Lab Results  Component Value Date   WBC 5.8 10/30/2019   NEUTROABS 6.7 10/29/2019   HGB 9.7 (L) 10/30/2019   HCT 29.3 (L) 10/30/2019   MCV 93.3 10/30/2019   PLT 352 10/30/2019      Chemistry      Component Value Date/Time   NA 134 (L) 10/30/2019 0435   NA 132 (L) 02/09/2015 1100   K 3.7 10/30/2019 0435   K 3.8 02/09/2015 1100   CL 97 (L) 10/30/2019 0435   CL 95 (L) 02/09/2015 1100   CO2 22 10/30/2019 0435   CO2 29 02/09/2015 1100  BUN 76 (H) 10/30/2019 0435   BUN 16 02/09/2015 1100   CREATININE 6.42 (H) 10/30/2019 0435   CREATININE 0.81 02/09/2015 1100      Component Value Date/Time   CALCIUM 8.6 (L) 10/30/2019 0435   CALCIUM 9.1 02/09/2015 1100   ALKPHOS 66 10/29/2019 0906   ALKPHOS 69 02/09/2015 1100   AST 22 10/29/2019 0906   AST 29 02/09/2015 1100   ALT 17 10/29/2019 0906   ALT 20 02/09/2015 1100   BILITOT 0.6 10/29/2019 0906    BILITOT 0.9 02/09/2015 1100       RADIOGRAPHIC STUDIES: I have personally reviewed the radiological images as listed and agreed with the findings in the report. CT Renal Stone Study  Result Date: 10/29/2019 CLINICAL DATA:  Acute renal failure. Right-sided back pain and dizziness. Lung cancer and breast cancer. EXAM: CT ABDOMEN AND PELVIS WITHOUT CONTRAST TECHNIQUE: Multidetector CT imaging of the abdomen and pelvis was performed following the standard protocol without IV contrast. COMPARISON:  09/27/2019 renal ultrasound. Abdominopelvic CT of 04/29/2019 FINDINGS: Lower chest: Clear lung bases. Normal heart size without pericardial or pleural effusion. Multivessel coronary artery atherosclerosis. Hepatobiliary: Hepatic cysts. Normal gallbladder, without biliary ductal dilatation. Pancreas: Mild pancreatic atrophy, without duct dilatation or acute inflammation. Spleen: Normal in size, without focal abnormality. Adrenals/Urinary Tract: Normal adrenal glands. No renal calculi or hydronephrosis. No hydroureter or ureteric calculi. No bladder calculi. Stomach/Bowel: Normal stomach, without wall thickening. Moderate amount of stool within the rectum. Scattered colonic diverticula. Left abdominal mucous fistula. Right abdominal end colostomy. Normal terminal ileum. Normal small bowel caliber. Vascular/Lymphatic: Advanced aortic and branch vessel atherosclerosis. No abdominopelvic adenopathy. Reproductive: Normal uterus and adnexa. Other: No significant free fluid. Mild pelvic floor laxity. No evidence of omental or peritoneal disease. Nonspecific subcutaneous nodule about the anterior right abdominal wall at 8 mm on 14/2, new. There is also subcutaneous nodule superficial the right gluteals of 11 mm on 29/2, new. Musculoskeletal: Mild osteopenia. L3 and L5 mild superior endplate compression deformities, similar to on the prior. This presumes a transitional S1 vertebral body. IMPRESSION: 1.  No urinary tract  calculi or hydronephrosis. 2. Otherwise, low sensitivity exam secondary to stone study technique. 3. L3 and L5 mild superior endplate compression deformities, chronic. 4. Extensive surgical changes within the colon, without acute complication. 5. Coronary artery atherosclerosis. Aortic Atherosclerosis (ICD10-I70.0). 6. New subcutaneous nodularity about the abdomen and right gluteal region. Correlate with sites of injections and consider physical exam. Electronically Signed   By: Abigail Miyamoto M.D.   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  Currently s/p definitive radiation [dec 7th 2020]. Clinically stable. Will plan PET scan after evaluation/discharge from hospital-see below..   # Acute renal failure- baseline GFR ~30; now 5; ? Increased ostomy out put.  needs urgent evaluation in ER/admission to hospital.  # Hypotension-systolic 70J to 62E.? Pre-renal vs. Sepsis.- HOLD Anti-HTN/lasix. Needs further work-up with blood cultures/infectious work-up.  # Weight loss-multifactorial; esophagus malignancy/radiation- see above.  # ? ACTIVE MULTIPLE MYELOMA-hemoglobin today is 11 given the 5% plasma cells/renal failure [no hypercalcemia or bone lesions on recent PET scan]. Clinically less likely to be active myeloma. Await M protein kappa lambda light chain ratio from today.  # I spoke to pt's daughter, [name] King-given the seriousness of the nature patient is to be evaluated in the emergency room.  # DISPOSITION: # ER for urgent  evaluation re: acute renal failure # follow up TBD-Dr.B   No orders of the defined types were placed in this encounter.  All questions were answered. The patient knows to call the clinic with any problems, questions or concerns.      [name], MD 10/30/2019 7:48 AM

## 2019-10-29 NOTE — Progress Notes (Signed)
Shuntia [place], Rn escorted patient via w/c to ER. Hand off provided to triage nurse in ER prior to transport to ER. Patient presented to the clinic today for routine followup. Hypotensive, headache, dizziness, shortness of breath ambulation. sats 99%RA. Patient reports abdominal spasms that "feels like a squeezing sensation." pt reports black stool in ostomy bag. Labs demonstrated acute renal failure. Creatinine 8.41. pt has h/o COVID 19 infection.

## 2019-10-29 NOTE — ED Notes (Signed)
Patient transported to CT 

## 2019-10-30 DIAGNOSIS — F329 Major depressive disorder, single episode, unspecified: Secondary | ICD-10-CM

## 2019-10-30 DIAGNOSIS — E43 Unspecified severe protein-calorie malnutrition: Secondary | ICD-10-CM

## 2019-10-30 DIAGNOSIS — N1832 Chronic kidney disease, stage 3b: Secondary | ICD-10-CM

## 2019-10-30 LAB — CBC
HCT: 29.3 % — ABNORMAL LOW (ref 36.0–46.0)
Hemoglobin: 9.7 g/dL — ABNORMAL LOW (ref 12.0–15.0)
MCH: 30.9 pg (ref 26.0–34.0)
MCHC: 33.1 g/dL (ref 30.0–36.0)
MCV: 93.3 fL (ref 80.0–100.0)
Platelets: 352 10*3/uL (ref 150–400)
RBC: 3.14 MIL/uL — ABNORMAL LOW (ref 3.87–5.11)
RDW: 13.6 % (ref 11.5–15.5)
WBC: 5.8 10*3/uL (ref 4.0–10.5)
nRBC: 0 % (ref 0.0–0.2)

## 2019-10-30 LAB — GLUCOSE, CAPILLARY: Glucose-Capillary: 96 mg/dL (ref 70–99)

## 2019-10-30 LAB — BASIC METABOLIC PANEL
Anion gap: 15 (ref 5–15)
BUN: 76 mg/dL — ABNORMAL HIGH (ref 8–23)
CO2: 22 mmol/L (ref 22–32)
Calcium: 8.6 mg/dL — ABNORMAL LOW (ref 8.9–10.3)
Chloride: 97 mmol/L — ABNORMAL LOW (ref 98–111)
Creatinine, Ser: 6.42 mg/dL — ABNORMAL HIGH (ref 0.44–1.00)
GFR calc Af Amer: 7 mL/min — ABNORMAL LOW (ref >60)
GFR calc non Af Amer: 6 mL/min — ABNORMAL LOW (ref >60)
Glucose, Bld: 103 mg/dL — ABNORMAL HIGH (ref 70–99)
Potassium: 3.7 mmol/L (ref 3.5–5.1)
Sodium: 134 mmol/L — ABNORMAL LOW (ref 135–145)

## 2019-10-30 LAB — KAPPA/LAMBDA LIGHT CHAINS
Kappa free light chain: 167.9 mg/L — ABNORMAL HIGH (ref 3.3–19.4)
Kappa, lambda light chain ratio: 0.28 (ref 0.26–1.65)
Lambda free light chains: 597.1 mg/L — ABNORMAL HIGH (ref 5.7–26.3)

## 2019-10-30 MED ORDER — NEPRO/CARBSTEADY PO LIQD
237.0000 mL | Freq: Three times a day (TID) | ORAL | Status: DC
  Administered 2019-10-31 – 2019-11-03 (×7): 237 mL via ORAL

## 2019-10-30 MED ORDER — LOPERAMIDE HCL 2 MG PO CAPS
2.0000 mg | ORAL_CAPSULE | Freq: Three times a day (TID) | ORAL | Status: DC
  Administered 2019-10-30 – 2019-11-04 (×14): 2 mg via ORAL
  Filled 2019-10-30 (×14): qty 1

## 2019-10-30 MED ORDER — BUTALBITAL-APAP-CAFFEINE 50-325-40 MG PO TABS
1.0000 | ORAL_TABLET | ORAL | Status: DC | PRN
  Administered 2019-10-31 – 2019-11-01 (×4): 1 via ORAL
  Filled 2019-10-30 (×4): qty 1

## 2019-10-30 MED ORDER — SODIUM CHLORIDE 0.9 % IV SOLN: INTRAVENOUS | Status: DC

## 2019-10-30 NOTE — Progress Notes (Addendum)
Pt still complaining of constant headache even with tylenol administration. Iloestomy bag was leaking and was changed. Page prime and talked to [facility] NP and states will place order. Will continue to monitor.  Update [id number] Kern NP ordered Fioricet 50-325-40 mg 1 tablet  Every 4 hours for Headache. Will continue to monitor.

## 2019-10-30 NOTE — Progress Notes (Signed)
[facility]  ROUNDING NOTE   Subjective:   Sodium bicarb at 22mL/hr  Na 134 (130)  Creatinine 6.42 (8.44)  Objective:  Vital signs in last 24 hours:  Temp:  [97.6 F (36.4 C)-98.6 F (37 C)] 97.6 F (36.4 C) (01/15 0733) Pulse Rate:  [48-111] 48 (01/15 0733) Resp:  [16-20] 16 (01/15 0733) BP: (94-129)/(51-84) 94/51 (01/15 0733) SpO2:  [97 %-100 %] 99 % (01/15 0733) Weight:  [50.7 kg-51.3 kg] 50.8 kg (01/15 0404)  Weight change:  Filed Weights   10/29/19 1047 10/29/19 2156 10/30/19 0404  Weight: 51.3 kg 50.7 kg 50.8 kg    Intake/Output: I/O last 3 completed shifts: In: 551.1 [I.V.:551.1] Out: 350 [Stool:350]   Intake/Output this shift:  Total I/O In: 240 [P.O.:240] Out: -   Physical Exam: General: NAD, laying in bed  Head: Normocephalic, atraumatic. Moist oral mucosal membranes  Eyes: Anicteric, PERRL  Neck: Supple, trachea midline  Lungs:  Clear to auscultation  Heart: Regular rate and rhythm  Abdomen:  +ostomy, left lower quadrant enteric fistula  Extremities:  no peripheral edema.  Neurologic: Nonfocal, moving all four extremities  Skin: No lesions        Basic Metabolic Panel: Recent Labs  Lab 10/29/19 0906 10/29/19 1126 10/30/19 0435  NA 133* 130* 134*  K 4.9 5.1 3.7  CL 97* 96* 97*  CO2 17* 17* 22  GLUCOSE 110* 92 103*  BUN 77* 75* 76*  CREATININE 8.41* 8.44* 6.42*  CALCIUM 9.7 9.5 8.6*    Liver Function Tests: Recent Labs  Lab 10/29/19 0906  AST 22  ALT 17  ALKPHOS 66  BILITOT 0.6  PROT 9.5*  ALBUMIN 4.5   No results for input(s): LIPASE, AMYLASE in the last 168 hours. No results for input(s): AMMONIA in the last 168 hours.  CBC: Recent Labs  Lab 10/29/19 0906 10/29/19 1126 10/30/19 0435  WBC 8.8 8.0 5.8  NEUTROABS 6.7  --   --   HGB 11.2* 11.0* 9.7*  HCT 35.2* 34.4* 29.3*  MCV 96.7 96.6 93.3  PLT 415* 406* 352    Cardiac Enzymes: No results for input(s): CKTOTAL, CKMB, CKMBINDEX, TROPONINI in the  last 168 hours.  BNP: Invalid input(s): POCBNP  CBG: Recent Labs  Lab 10/30/19 0729  GLUCAP 12    Microbiology: Results for orders placed or performed during the hospital encounter of 10/14/19  SARS CORONAVIRUS 2 (TAT 6-24 HRS) Nasopharyngeal Nasopharyngeal Swab     Status: Abnormal   Collection Time: 10/14/19  9:34 AM   Specimen: Nasopharyngeal Swab  Result Value Ref Range Status   SARS Coronavirus 2 POSITIVE (A) NEGATIVE Final    Comment: RESULT CALLED TO, READ BACK BY AND VERIFIED WITH: B.ALEJA RN 1937 10/14/2019 MCCORMICK K (NOTE) SARS-CoV-2 target nucleic acids are DETECTED. The SARS-CoV-2 RNA is generally detectable in upper and lower respiratory specimens during the acute phase of infection. Positive results are indicative of the presence of SARS-CoV-2 RNA. Clinical correlation with patient history and other diagnostic information is  necessary to determine patient infection status. Positive results do not rule out bacterial infection or co-infection with other viruses.  The expected result is Negative. Fact Sheet for Patients: SugarRoll.be Fact Sheet for Healthcare Providers: https://www.woods-mathews.com/ This test is not yet approved or cleared by the [place] FDA and  has been authorized for detection and/or diagnosis of SARS-CoV-2 by FDA under an Emergency Use Authorization (EUA). This EUA will remain  in effect (meaning this test can be used) for  the duration of the COVID-19 declaration under Section 564(b)(1) of the Act, 21 U.S.C. section 360bbb-3(b)(1), unless the authorization is terminated or revoked sooner. Performed at [facility], [place] [address] [place], [place] [address]     Coagulation Studies: No results for input(s): LABPROT, INR in the last 72 hours.  Urinalysis: Recent Labs    10/29/19 [place] 1.020  PHURINE 5.0  GLUCOSEU NEGATIVE  HGBUR NEGATIVE   BILIRUBINUR NEGATIVE  KETONESUR NEGATIVE  PROTEINUR NEGATIVE  NITRITE NEGATIVE  LEUKOCYTESUR SMALL*      Imaging: CT Renal Stone Study  Result Date: 10/29/2019 CLINICAL DATA:  Acute renal failure. Right-sided back pain and dizziness. Lung cancer and breast cancer. EXAM: CT ABDOMEN AND PELVIS WITHOUT CONTRAST TECHNIQUE: Multidetector CT imaging of the abdomen and pelvis was performed following the standard protocol without IV contrast. COMPARISON:  09/27/2019 renal ultrasound. Abdominopelvic CT of 04/29/2019 FINDINGS: Lower chest: Clear lung bases. Normal heart size without pericardial or pleural effusion. Multivessel coronary artery atherosclerosis. Hepatobiliary: Hepatic cysts. Normal gallbladder, without biliary ductal dilatation. Pancreas: Mild pancreatic atrophy, without duct dilatation or acute inflammation. Spleen: Normal in size, without focal abnormality. Adrenals/Urinary Tract: Normal adrenal glands. No renal calculi or hydronephrosis. No hydroureter or ureteric calculi. No bladder calculi. Stomach/Bowel: Normal stomach, without wall thickening. Moderate amount of stool within the rectum. Scattered colonic diverticula. Left abdominal mucous fistula. Right abdominal end colostomy. Normal terminal ileum. Normal small bowel caliber. Vascular/Lymphatic: Advanced aortic and branch vessel atherosclerosis. No abdominopelvic adenopathy. Reproductive: Normal uterus and adnexa. Other: No significant free fluid. Mild pelvic floor laxity. No evidence of omental or peritoneal disease. Nonspecific subcutaneous nodule about the anterior right abdominal wall at 8 mm on 14/2, new. There is also subcutaneous nodule superficial the right gluteals of 11 mm on 29/2, new. Musculoskeletal: Mild osteopenia. L3 and L5 mild superior endplate compression deformities, similar to on the prior. This presumes a transitional S1 vertebral body. IMPRESSION: 1.  No urinary tract calculi or hydronephrosis. 2. Otherwise, low  sensitivity exam secondary to stone study technique. 3. L3 and L5 mild superior endplate compression deformities, chronic. 4. Extensive surgical changes within the colon, without acute complication. 5. Coronary artery atherosclerosis. Aortic Atherosclerosis (ICD10-I70.0). 6. New subcutaneous nodularity about the abdomen and right gluteal region. Correlate with sites of injections and consider physical exam. Electronically Signed   By: Abigail Miyamoto M.D.   On: 10/29/2019 14:06     Medications:   . sodium chloride 75 mL/hr at 10/30/19 0839   . calcium-vitamin D  0.5 tablet Oral Daily  . feeding supplement (NEPRO CARB STEADY)  237 mL Oral TID BM  . ferrous sulfate  325 mg Oral BID WC  . fluticasone furoate-vilanterol  1 puff Inhalation Daily  . heparin  5,000 Units Subcutaneous Q8H  . multivitamin with minerals  1 tablet Oral Daily  . pantoprazole  40 mg Oral BID AC  . sucralfate  1 g Oral QID  . umeclidinium bromide  1 puff Inhalation Daily   acetaminophen, albuterol, guaiFENesin-dextromethorphan, hydrOXYzine, loperamide  Assessment/ Plan:  [name] is a 64 y.o. 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 Acute renal failure with metabolic acidosis on chronic kidney disease stage IIIB. Baseline creatinine of 1.7, GFR of 36 on 10/21/19.  Chronic  kidney disease secondary to hypertension Acute renal failure seems to be secondary to prerenal azotemia.  No obstruction of CT. No IV contrast exposure.  - Continue IV fluids: change from sodium bicarbonate to normal saline.   2. Hypotension: secondary to volume depletion: holding carvedilol  3. Anemia with renal failure: hemoglobin 9.7, normocytic  4. Hyponatremia: hypovolemic. Improved with IV fluids.    5. Diarrhea - loperamide.    LOS: 1 Danielle [name] 1/15/20219:55 AM

## 2019-10-30 NOTE — Progress Notes (Signed)
Initial Nutrition Assessment  DOCUMENTATION CODES:   Underweight, Severe malnutrition in context of chronic illness  INTERVENTION:  Continue Nepro Shake po daily, each supplement provides 425 kcal and 19 grams protein (pt likes chocolate)  Magic cup BID with meals, each supplement provides 290 kcal and 9 grams of protein (pt likes vanilla)   NUTRITION DIAGNOSIS:   Severe Malnutrition related to chronic illness(CHF; CKD3) as evidenced by energy intake < or equal to 75% for > or equal to 1 month, moderate fat depletion, severe fat depletion, moderate muscle depletion, severe muscle depletion, percent weight loss.  GOAL:   Patient will meet greater than or equal to 90% of their needs, Weight gain    MONITOR:   PO intake, Supplement acceptance, I & O's, Labs, Weight trends  REASON FOR ASSESSMENT:   Malnutrition Screening Tool    ASSESSMENT:  64 year old female with past medical history of HTN, COPD, GERD, depression with anxiety, breast cancer s/p mastectomy, chemo and RXT, h/o lung cancer s/p XRT, CHF with EF 35-40%, iron deficiency anemia, CKD3, ileostomy(2019) [facility] admit with positive Covid 19 Ag on 12/13, [facility] admit with positive Covid 19 PCR on 12/30 who presented to ED with abdominal cramps and right sided back pain. In ED pt found to have worsening kidney function and admitted for acute renal failure superimposed on CKD3.  Per chart, patient reports that she had stopped urinating. Nephrology following, per notes acute renal failure seems to be secondary to prerenal azotemia from high output ostomy/diarrhea; no acute indication for dialysis, sodium bicarb infusion stared 1/14.  Patient eating 100% x 1 meal this admission. Patient playing a game on her phone and reports feeling pretty good this afternoon. She reports that her appetite is beginning to pick back up since completing radiation treatments in December. She recalls eating most of her spaghetti, bread and banana pudding  today for lunch. Patient reported usual intake of 1 meal/day over the past couple of months, recalling sometimes drinking an Ensure, a cup of chx/noodle soup, or a fish sandwich.   Patient meets criteria for severe malnutrition in the context of chronic disease given moderate/severe fat and muscle depletions to entire body noted below on NFPE, percent wt loss, and patient reported consuming 1 meal/day over the past several months. She endorses desire to gain weight, encouraged to eat small frequent meals and educated on high calorie/protein snacks as well as encouraged to conume daily nutrition supplement. Patient reports that she likes Nepro as well as Ensure. RD will provide Magic Cup with lunch and dinner meals and recommend continuing Nepro daily to aid with calorie/protein.   I/Os: +201 ml since admit  Currently pt 50.8 kg (112 lbs)  Patient endorses recent wt loss, recalls 126-130 lbs in October consistent with wt history. Patient has lost 14 lbs (11.1%) over the past 4 months, which is severe for time frame. 10/29/19 51.3 kg  10/21/19 56.8 kg  09/26/19 55.3 kg  09/25/19 55.3 kg  08/27/19 55.5 kg  07/21/19 58 kg  07/20/19 57.5 kg  07/08/19 58.5 kg   NUTRITION - FOCUSED PHYSICAL EXAM:    Most Recent Value  Orbital Region  Moderate depletion  Upper Arm Region  Severe depletion  Thoracic and Lumbar Region  Moderate depletion  Buccal Region  Mild depletion  Temple Region  Moderate depletion  Clavicle Bone Region  Moderate depletion  Clavicle and Acromion Bone Region  Mild depletion  Scapular Bone Region  Unable to assess  Dorsal Hand  Moderate depletion  Patellar Region  Severe depletion  Anterior Thigh Region  Severe depletion  Posterior Calf Region  Severe depletion  Edema (RD Assessment)  None  Hair  Unable to assess  Eyes  Reviewed  Mouth  Reviewed [edentoulous]  Skin  Reviewed  Nails  Reviewed       Diet Order:   Diet Order            Diet Heart Room service  appropriate? Yes; Fluid consistency: Thin  Diet effective now              EDUCATION NEEDS:   Education needs have been addressed  Skin:  Skin Assessment: Reviewed RN Assessment  Last BM:  1/15- 350 ml via ostomy  Height:   Ht Readings from Last 1 Encounters:  10/29/19 5\' 6"  (1.676 m)    Weight:   Wt Readings from Last 1 Encounters:  10/30/19 50.8 kg    Ideal Body Weight:  59 kg  BMI:  Body mass index is 18.08 kg/m.  Estimated Nutritional Needs:   Kcal:  1500-1700  Protein:  75-85  Fluid:  >1.4 L/day    Lajuan Lines, RD, LDN Clinical Nutrition Office Telephone [phone] After Hours/Weekend Pager: [phone]

## 2019-10-30 NOTE — Consult Note (Signed)
[place] Nurse ostomy consult note Patient is known to the [place] team from her previous admissions. She was seen by my partner on 1/4 during that admission. Stoma type/location: Hi output ileostomy in the right middle quadrant. Stomal assessment/size: 1 and 1/4 inches, flush with skin  Peristomal assessment: Intact, macerated Treatment options for stomal/peristomal skin: skin barrier ring plus an additional 1/4 of ring placed into the crease at 3 o'clock (near umbilicus) Output: thickening light brown effluent (patient is taking her second dose of lomotil at the time of this writing)  Ostomy pouching: 2pc. pouching system (2 and 3/4 inch) with skin barrier ring applied today as we awaited the delivery of 1 piece convex ostomy pouching supplies. This is intact even after 4 hours so we will leave in place. Education provided: Patient is independent with ostomy care at home, she requires assistance while in the hospital and is weak and with an altered amount of output. Bedside RN is appreciated.  She and I discuss the change routine as being every 2-3 days and the emptying routine as being whenever the pouch is 1/3 to 1/2 full of stool or flatus.  We are in agreement on the need to fill the defect at the umbilicus with skin barrier ring.  Patient again declines a belt and states that it is too uncomfortable for her. Enrolled patient in [place] program: Yes, during a previous admission  [place] nursing team will follow while in house, and will remain available to this patient, the nursing and medical teams.    Thanks, Maudie Flakes, MSN, RN, [place], Arther Abbott  Pager# [phone]

## 2019-10-30 NOTE — Progress Notes (Addendum)
[place] at [place] NAME: Danielle [name]    MR#:  [id number]  DATE OF BIRTH:  1956-01-06  SUBJECTIVE:  patient recently discharged on January 6 after COVID infection. She is been having on and off diarrhea however increased frequency of diarrhea recently filling up her colostomy bag very frequently.  To be severely dehydrated with poor appetite. Currently getting IV fluids. Patient is worried about her weight loss given her cancer (esophageal)  REVIEW OF SYSTEMS:   Review of Systems  Constitutional: Positive for malaise/fatigue. Negative for chills, fever and weight loss.  HENT: Negative for ear discharge, ear pain and nosebleeds.   Eyes: Negative for blurred vision, pain and discharge.  Respiratory: Positive for shortness of breath. Negative for sputum production, wheezing and stridor.   Cardiovascular: Negative for chest pain, palpitations, orthopnea and PND.  Gastrointestinal: Positive for diarrhea and nausea. Negative for abdominal pain and vomiting.  Genitourinary: Negative for frequency and urgency.  Musculoskeletal: Negative for back pain and joint pain.  Neurological: Positive for weakness. Negative for sensory change, speech change and focal weakness.  Psychiatric/Behavioral: Negative for depression and hallucinations. The patient is not nervous/anxious.    Tolerating Diet: Tolerating PT:   DRUG ALLERGIES:  No Known Allergies  VITALS:  Blood pressure 92/66, pulse 95, temperature 98.7 F (37.1 C), temperature source Oral, resp. rate 16, height 5\' 6"  (1.676 m), weight 50.8 kg, SpO2 100 %.  PHYSICAL EXAMINATION:   Physical Exam  GENERAL:  64 y.o.-year-old patient lying in the bed with no acute distress. Thin frail cachectic EYES: Pupils equal, round, reactive to light and accommodation. No scleral icterus. Extraocular muscles intact.  HEENT: Head atraumatic, normocephalic. Oropharynx and nasopharynx dry NECK:  Supple, no  jugular venous distention. No thyroid enlargement, no tenderness.  LUNGS: Normal breath sounds bilaterally, no wheezing, rales, rhonchi. No use of accessory muscles of respiration.  CARDIOVASCULAR: S1, S2 normal. No murmurs, rubs, or gallops.  ABDOMEN: Soft, nontender, nondistended. Bowel sounds present -patient has ileostomy on the right. Small area of skin breakdown appears mucus fistula on the abdomen.  EXTREMITIES: No cyanosis, clubbing or edema b/l.    NEUROLOGIC: Cranial nerves II through XII are intact. No focal Motor or sensory deficits b/l.   PSYCHIATRIC:  patient is alert and oriented x 3.  SKIN: No obvious rash, lesion, or ulcer.   LABORATORY PANEL:  CBC Recent Labs  Lab 10/30/19 0435  WBC 5.8  HGB 9.7*  HCT 29.3*  PLT 352    Chemistries  Recent Labs  Lab 10/29/19 0906 10/29/19 1126 10/30/19 0435  NA 133*   < > 134*  K 4.9   < > 3.7  CL 97*   < > 97*  CO2 17*   < > 22  GLUCOSE 110*   < > 103*  BUN 77*   < > 76*  CREATININE 8.41*   < > 6.42*  CALCIUM 9.7   < > 8.6*  AST 22  --   --   ALT 17  --   --   ALKPHOS 66  --   --   BILITOT 0.6  --   --    < > = values in this interval not displayed.   Cardiac Enzymes No results for input(s): TROPONINI in the last 168 hours. RADIOLOGY:  CT Renal Stone Study  Result Date: 10/29/2019 CLINICAL DATA:  Acute renal failure. Right-sided back pain and dizziness. Lung cancer and breast cancer. EXAM: CT ABDOMEN  AND PELVIS WITHOUT CONTRAST TECHNIQUE: Multidetector CT imaging of the abdomen and pelvis was performed following the standard protocol without IV contrast. COMPARISON:  09/27/2019 renal ultrasound. Abdominopelvic CT of 04/29/2019 FINDINGS: Lower chest: Clear lung bases. Normal heart size without pericardial or pleural effusion. Multivessel coronary artery atherosclerosis. Hepatobiliary: Hepatic cysts. Normal gallbladder, without biliary ductal dilatation. Pancreas: Mild pancreatic atrophy, without duct dilatation or acute  inflammation. Spleen: Normal in size, without focal abnormality. Adrenals/Urinary Tract: Normal adrenal glands. No renal calculi or hydronephrosis. No hydroureter or ureteric calculi. No bladder calculi. Stomach/Bowel: Normal stomach, without wall thickening. Moderate amount of stool within the rectum. Scattered colonic diverticula. Left abdominal mucous fistula. Right abdominal end colostomy. Normal terminal ileum. Normal small bowel caliber. Vascular/Lymphatic: Advanced aortic and branch vessel atherosclerosis. No abdominopelvic adenopathy. Reproductive: Normal uterus and adnexa. Other: No significant free fluid. Mild pelvic floor laxity. No evidence of omental or peritoneal disease. Nonspecific subcutaneous nodule about the anterior right abdominal wall at 8 mm on 14/2, new. There is also subcutaneous nodule superficial the right gluteals of 11 mm on 29/2, new. Musculoskeletal: Mild osteopenia. L3 and L5 mild superior endplate compression deformities, similar to on the prior. This presumes a transitional S1 vertebral body. IMPRESSION: 1.  No urinary tract calculi or hydronephrosis. 2. Otherwise, low sensitivity exam secondary to stone study technique. 3. L3 and L5 mild superior endplate compression deformities, chronic. 4. Extensive surgical changes within the colon, without acute complication. 5. Coronary artery atherosclerosis. Aortic Atherosclerosis (ICD10-I70.0). 6. New subcutaneous nodularity about the abdomen and right gluteal region. Correlate with sites of injections and consider physical exam. Electronically Signed   By: Abigail Miyamoto M.D.   On: 10/29/2019 14:06   ASSESSMENT AND PLAN:  Danielle MCCLEESE is a 64 y.o. female with medical history significant of hypertension, COPD, GERD, depression with anxiety, breast cancer (mastectomy, chemo and radiation therapy), lung cancer (radiation therapy), CHF with EF 35-40%, iron deficiency anemia, CKD-3b, ileostomy, positive covid 19 Ag on 12/13 and positive  covid 19 PCR on 12/30,  who presents with abdominal cramps and right sided back pain.  # Acute renal failure superimposed on stage 3b chronic kidney disease ([place]): with hypovolemia due to large volume diarrhea -appears prerenal azotemia suspected due to COVID related diarrhea -CT scan showed no obstruction or hydronephrosis.  - Renal, Dr. Juleen China was consulted. 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 Rogue Bussing -follow dietitian recommendation -nepro drinks TID  #Moderate COPD (chronic obstructive pulmonary disease) ([place]): -Continue bronchodilators.  #Depression:  No SI or HI -continue home meds  #Chronic systolic heart failure ([place]): 2D echo on 04/21/2018 showed EF 35-40%.  Patient does not have leg edema or JVD CHF is compensated. -Hold Lasix due to worsening renal function   #HTN (hypertension): Blood pressures are soft -Hold Lasix and Coreg  #Iron deficiency anemia secondary to blood loss (chronic):  -Hemoglobin 11.0. -Continue iron supplement -patient is status post ileostomy secondary to severe right colonic bleeding. Follows with Dr. Windell Moment. -wound consult for ileostomy leak  GERD (gastroesophageal reflux disease):  -PPI  Procedures:none Family communication :pt Consults :Nephrology Discharge Disposition :home CODE STATUS: Full DVT Prophylaxis :heparin SQ  TOTAL TIME TAKING CARE OF THIS PATIENT: *35* minutes.  >50% time spent on counselling and coordination of care  Note:  This dictation was prepared with Dragon dictation along with smaller phrase technology. Any transcriptional errors that result from this process are unintentional.  Fritzi Mandes M.D on 10/30/2019 at 4:04  PM  Between 7am to 6pm - Pager - [phone]  After 6pm go to [email or web address]  [facility]   CC: Primary care physician; [facility]Patient ID: ISHANA BLADES, female   DOB: 1956-09-05, 64 y.o.   MRN: [id number]

## 2019-10-30 NOTE — Plan of Care (Signed)
  Problem: Education: Goal: Knowledge of ostomy care will improve Outcome: Progressing   Problem: Bowel/Gastric/Urinary: Goal: Gastrointestinal status for postoperative course will improve Outcome: Progressing   Problem: Coping: Goal: Coping ability will improve Outcome: Progressing

## 2019-10-31 ENCOUNTER — Inpatient Hospital Stay: Payer: Medicare Other

## 2019-10-31 DIAGNOSIS — R197 Diarrhea, unspecified: Secondary | ICD-10-CM

## 2019-10-31 LAB — PARATHYROID HORMONE, INTACT (NO CA): PTH: 152 pg/mL — ABNORMAL HIGH (ref 15–65)

## 2019-10-31 LAB — CBC
HCT: 26.9 % — ABNORMAL LOW (ref 36.0–46.0)
Hemoglobin: 8.9 g/dL — ABNORMAL LOW (ref 12.0–15.0)
MCH: 31.4 pg (ref 26.0–34.0)
MCHC: 33.1 g/dL (ref 30.0–36.0)
MCV: 95.1 fL (ref 80.0–100.0)
Platelets: 343 10*3/uL (ref 150–400)
RBC: 2.83 MIL/uL — ABNORMAL LOW (ref 3.87–5.11)
RDW: 13.8 % (ref 11.5–15.5)
WBC: 6.1 10*3/uL (ref 4.0–10.5)
nRBC: 0 % (ref 0.0–0.2)

## 2019-10-31 LAB — BASIC METABOLIC PANEL
Anion gap: 12 (ref 5–15)
BUN: 72 mg/dL — ABNORMAL HIGH (ref 8–23)
CO2: 19 mmol/L — ABNORMAL LOW (ref 22–32)
Calcium: 8.4 mg/dL — ABNORMAL LOW (ref 8.9–10.3)
Chloride: 105 mmol/L (ref 98–111)
Creatinine, Ser: 5.01 mg/dL — ABNORMAL HIGH (ref 0.44–1.00)
GFR calc Af Amer: 10 mL/min — ABNORMAL LOW (ref >60)
GFR calc non Af Amer: 8 mL/min — ABNORMAL LOW (ref >60)
Glucose, Bld: 99 mg/dL (ref 70–99)
Potassium: 3.8 mmol/L (ref 3.5–5.1)
Sodium: 136 mmol/L (ref 135–145)

## 2019-10-31 LAB — C DIFFICILE QUICK SCREEN W PCR REFLEX
C Diff antigen: NEGATIVE
C Diff interpretation: NOT DETECTED
C Diff toxin: NEGATIVE

## 2019-10-31 LAB — PHOSPHORUS: Phosphorus: 4.9 mg/dL — ABNORMAL HIGH (ref 2.5–4.6)

## 2019-10-31 MED ORDER — IOHEXOL 9 MG/ML PO SOLN
500.0000 mL | ORAL | Status: AC
  Administered 2019-10-31 (×2): 500 mL via ORAL

## 2019-10-31 MED ORDER — CITALOPRAM HYDROBROMIDE 20 MG PO TABS: 40.0000 mg | ORAL_TABLET | Freq: Every day | ORAL | Status: DC

## 2019-10-31 MED ORDER — TRAMADOL HCL 50 MG PO TABS
50.0000 mg | ORAL_TABLET | Freq: Two times a day (BID) | ORAL | Status: DC | PRN
  Administered 2019-10-31: 12:00:00 50 mg via ORAL
  Filled 2019-10-31: qty 1

## 2019-10-31 NOTE — Plan of Care (Signed)
  Problem: Education: Goal: Knowledge of ostomy care will improve Outcome: Progressing   Problem: Coping: Goal: Coping ability will improve Outcome: Progressing

## 2019-10-31 NOTE — Progress Notes (Addendum)
PROGRESS NOTE    Danielle [name]  FTD:[id number] DOB: [birth year] DOA: 10/29/2019 PCP: [facility]       Assessment & Plan:   Principal Problem:   Acute renal failure superimposed on stage 3b chronic kidney disease ([place]) Active Problems:   Moderate COPD (chronic obstructive pulmonary disease) (HCC)   Severe protein-calorie malnutrition (HCC)   Chronic systolic heart failure (HCC)   HTN (hypertension)   Iron deficiency anemia secondary to blood loss (chronic)   GERD (gastroesophageal reflux disease)   Depression  AKI on CKDIIIb:possibly secondary to prerenal azotemia suspected due to COVID related diarrhea. GI panel ordered. CT abd/pelvis ordered. Baseline creatinine 1.7. Cr is trending down. Continue on IVFs. Nephro following and recs apprec  Diarrhea: etiology unclear. GI panel ordered. CT abd/pelvis ordered.   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 Rogue Bussing. Continue w/ nutritional supplements   Moderate COPD: w/o exacerbation. Continue bronchodilators.  Depression: severity unknown. Will hold home dose of citalopram secondary to AKI on CKD  Chronic systolic heart failure :2D echo on 04/21/2018 showed EF 35-40%. Euvolemic. Hold Lasix due to worsening renal function  CWC:BJSEGBTD to hold lasix and coreg secondary to low normal BP  Iron deficiency anemia: secondary to chronic blood loss. Continue iron supplement. 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 Follows with Dr. Windell Moment. Wound care consulted  GERD: continue PPI     DVT prophylaxis: heparin Code Status: full  Family Communication:  Disposition Plan:    Consultants:   nephro   Procedures:    Antimicrobials:    Subjective: Pt c/o headache and abd pain.  Objective: Vitals:   10/30/19 2015 10/31/19 0417 10/31/19 0540 10/31/19 0742  BP: 101/70 (!) 91/57 93/65 103/73  Pulse: 82 (!) 50 97  87  Resp: 18 20  18   Temp: 98.2 F (36.8 C) 98.2 F (36.8 C)  98.3 F (36.8 C)  TempSrc: Oral Oral  Oral  SpO2: 98% 96%  100%  Weight:  50.3 kg    Height:        Intake/Output Summary (Last 24 hours) at 10/31/2019 0754 Last data filed at 10/31/2019 0419 Gross per 24 hour  Intake 1494.62 ml  Output 1785 ml  Net -290.38 ml   Filed Weights   10/29/19 2156 10/30/19 0404 10/31/19 0417  Weight: 50.7 kg 50.8 kg 50.3 kg    Examination:  General exam: Appears calm but uncomfortable  Respiratory system: diminished breath sounds b/l. Cardiovascular system: S1 & S2 +.  No rubs, gallops or clicks.  Gastrointestinal system: Abdomen is nondistended, soft and mild tenderness to palpation. Normal bowel sounds heard. Central nervous system: Alert and oriented. Moves all 4 extremities Psychiatry: Judgement and insight appear normal. Flat mood and affect     Data Reviewed: I have personally reviewed following labs and imaging studies  CBC: Recent Labs  Lab 10/29/19 0906 10/29/19 1126 10/30/19 0435 10/31/19 0448  WBC 8.8 8.0 5.8 6.1  NEUTROABS 6.7  --   --   --   HGB 11.2* 11.0* 9.7* 8.9*  HCT 35.2* 34.4* 29.3* 26.9*  MCV 96.7 96.6 93.3 95.1  PLT 415* 406* 352 176   Basic Metabolic Panel: Recent Labs  Lab 10/29/19 0906 10/29/19 1126 10/30/19 0435 10/31/19 0448  NA 133* 130* 134* 136  K 4.9 5.1 3.7 3.8  CL 97* 96* 97* 105  CO2 17* 17* 22 19*  GLUCOSE 110* 92 103* 99  BUN 77* 75* 76* 72*  CREATININE 8.41* 8.44* 6.42* 5.01*  CALCIUM 9.7 9.5 8.6* 8.4*  PHOS  --   --   --  4.9*   GFR: Estimated Creatinine Clearance: 8.9 mL/min (A) (by C-G formula based on SCr of 5.01 mg/dL (H)). Liver Function Tests: Recent Labs  Lab 10/29/19 0906  AST 22  ALT 17  ALKPHOS 66  BILITOT 0.6  PROT 9.5*  ALBUMIN 4.5   No results for input(s): LIPASE, AMYLASE in the last 168 hours. No results for input(s): AMMONIA in the last 168 hours. Coagulation Profile: No results for input(s):  INR, PROTIME in the last 168 hours. Cardiac Enzymes: No results for input(s): CKTOTAL, CKMB, CKMBINDEX, TROPONINI in the last 168 hours. BNP (last 3 results) No results for input(s): PROBNP in the last 8760 hours. HbA1C: No results for input(s): HGBA1C in the last 72 hours. CBG: Recent Labs  Lab 10/30/19 0729  GLUCAP 96   Lipid Profile: No results for input(s): CHOL, HDL, LDLCALC, TRIG, CHOLHDL, LDLDIRECT in the last 72 hours. Thyroid Function Tests: No results for input(s): TSH, T4TOTAL, FREET4, T3FREE, THYROIDAB in the last 72 hours. Anemia Panel: No results for input(s): VITAMINB12, FOLATE, FERRITIN, TIBC, IRON, RETICCTPCT in the last 72 hours. Sepsis Labs: No results for input(s): PROCALCITON, LATICACIDVEN in the last 168 hours.  No results found for this or any previous visit (from the past 240 hour(s)).       Radiology Studies: CT Renal Stone Study  Result Date: 10/29/2019 CLINICAL DATA:  Acute renal failure. Right-sided back pain and dizziness. Lung cancer and breast cancer. EXAM: CT ABDOMEN AND PELVIS WITHOUT CONTRAST TECHNIQUE: Multidetector CT imaging of the abdomen and pelvis was performed following the standard protocol without IV contrast. COMPARISON:  09/27/2019 renal ultrasound. Abdominopelvic CT of 04/29/2019 FINDINGS: Lower chest: Clear lung bases. Normal heart size without pericardial or pleural effusion. Multivessel coronary artery atherosclerosis. Hepatobiliary: Hepatic cysts. Normal gallbladder, without biliary ductal dilatation. Pancreas: Mild pancreatic atrophy, without duct dilatation or acute inflammation. Spleen: Normal in size, without focal abnormality. Adrenals/Urinary Tract: Normal adrenal glands. No renal calculi or hydronephrosis. No hydroureter or ureteric calculi. No bladder calculi. Stomach/Bowel: Normal stomach, without wall thickening. Moderate amount of stool within the rectum. Scattered colonic diverticula. Left abdominal mucous fistula. Right  abdominal end colostomy. Normal terminal ileum. Normal small bowel caliber. Vascular/Lymphatic: Advanced aortic and branch vessel atherosclerosis. No abdominopelvic adenopathy. Reproductive: Normal uterus and adnexa. Other: No significant free fluid. Mild pelvic floor laxity. No evidence of omental or peritoneal disease. Nonspecific subcutaneous nodule about the anterior right abdominal wall at 8 mm on 14/2, new. There is also subcutaneous nodule superficial the right gluteals of 11 mm on 29/2, new. Musculoskeletal: Mild osteopenia. L3 and L5 mild superior endplate compression deformities, similar to on the prior. This presumes a transitional S1 vertebral body. IMPRESSION: 1.  No urinary tract calculi or hydronephrosis. 2. Otherwise, low sensitivity exam secondary to stone study technique. 3. L3 and L5 mild superior endplate compression deformities, chronic. 4. Extensive surgical changes within the colon, without acute complication. 5. Coronary artery atherosclerosis. Aortic Atherosclerosis (ICD10-I70.0). 6. New subcutaneous nodularity about the abdomen and right gluteal region. Correlate with sites of injections and consider physical exam. Electronically Signed   By: Abigail Miyamoto M.D.   On: 10/29/2019 14:06        Scheduled Meds: . calcium-vitamin D  0.5 tablet Oral Daily  . feeding supplement (NEPRO CARB STEADY)  237 mL Oral TID BM  . feeding supplement (NEPRO CARB STEADY)  237  mL Oral TID BM  . ferrous sulfate  325 mg Oral BID WC  . fluticasone furoate-vilanterol  1 puff Inhalation Daily  . heparin  5,000 Units Subcutaneous Q8H  . loperamide  2 mg Oral TID  . multivitamin with minerals  1 tablet Oral Daily  . pantoprazole  40 mg Oral BID AC  . sucralfate  1 g Oral QID  . umeclidinium bromide  1 puff Inhalation Daily   Continuous Infusions: . sodium chloride 75 mL/hr at 10/30/19 2345     LOS: 2 days    Time spent: 30 mins     Wyvonnia Dusky, MD [facility] Pager [phone]  [phone]  If 7PM-7AM, please contact night-coverage [email or web address] Password [facility] 10/31/2019, 7:54 AM

## 2019-10-31 NOTE — Progress Notes (Signed)
[facility], [place] 10/31/19  Subjective:   Hospital day # 2  Doing fair Denies any acute complaints States she is able to eat some but oral intake overall is low Renal: 01/15 0701 - 01/16 0700 In: 1494.6 [P.O.:480; I.V.:1014.6] Out: 1785 [Stool:1785] Lab Results  Component Value Date   CREATININE 5.01 (H) 10/31/2019   CREATININE 6.42 (H) 10/30/2019   CREATININE 8.44 (H) 10/29/2019     Objective:  Vital signs in last 24 hours:  Temp:  [97.2 F (36.2 C)-98.3 F (36.8 C)] 97.2 F (36.2 C) (01/16 1627) Pulse Rate:  [50-97] 91 (01/16 1627) Resp:  [18-20] 18 (01/16 1627) BP: (85-103)/(57-73) 85/60 (01/16 1627) SpO2:  [94 %-100 %] 94 % (01/16 1627) Weight:  [50.3 kg] 50.3 kg (01/16 0417)  Weight change: -0.998 kg Filed Weights   10/29/19 2156 10/30/19 0404 10/31/19 0417  Weight: 50.7 kg 50.8 kg 50.3 kg    Intake/Output:    Intake/Output Summary (Last 24 hours) at 10/31/2019 1708 Last data filed at 10/31/2019 1553 Gross per 24 hour  Intake 1014.62 ml  Output 1535 ml  Net -520.38 ml     Physical Exam: General:  Laying in the bed, no acute distress  HEENT  moist oral mucous membranes  Pulm/lungs  normal breathing effort  CVS/Heart  irregular rhythm  Abdomen:   Soft, nontender, colostomy in place  Extremities:  No peripheral edema  Neurologic:  Alert, able to follow some commands  Skin:  No acute rashes    Basic Metabolic Panel:  Recent Labs  Lab 10/29/19 0906 10/29/19 0906 10/29/19 1126 10/30/19 0435 10/31/19 0448  NA 133*  --  130* 134* 136  K 4.9  --  5.1 3.7 3.8  CL 97*  --  96* 97* 105  CO2 17*  --  17* 22 19*  GLUCOSE 110*  --  92 103* 99  BUN 77*  --  75* 76* 72*  CREATININE 8.41*  --  8.44* 6.42* 5.01*  CALCIUM 9.7   < > 9.5 8.6* 8.4*  PHOS  --   --   --   --  4.9*   < > = values in this interval not displayed.     CBC: Recent Labs  Lab 10/29/19 0906 10/29/19 1126 10/30/19 0435 10/31/19 0448  WBC 8.8  8.0 5.8 6.1  NEUTROABS 6.7  --   --   --   HGB 11.2* 11.0* 9.7* 8.9*  HCT 35.2* 34.4* 29.3* 26.9*  MCV 96.7 96.6 93.3 95.1  PLT 415* 406* 352 343     No results found for: HEPBSAG, HEPBSAB, HEPBIGM    Microbiology:  No results found for this or any previous visit (from the past 240 hour(s)).  Coagulation Studies: No results for input(s): LABPROT, INR in the last 72 hours.  Urinalysis: Recent Labs    10/29/19 [place] 1.020  PHURINE 5.0  GLUCOSEU NEGATIVE  HGBUR NEGATIVE  BILIRUBINUR NEGATIVE  KETONESUR NEGATIVE  PROTEINUR NEGATIVE  NITRITE NEGATIVE  LEUKOCYTESUR SMALL*      Imaging: CT ABDOMEN PELVIS WO CONTRAST  Result Date: 10/31/2019 CLINICAL DATA:  Abdominal cramping EXAM: CT ABDOMEN AND PELVIS WITHOUT CONTRAST TECHNIQUE: Multidetector CT imaging of the abdomen and pelvis was performed following the standard protocol without IV contrast. COMPARISON:  10/29/2019 FINDINGS: Lower chest: Lung bases are clear. Heart size is normal. Coronary artery calcifications evident. Hepatobiliary: Stable size and appearance of small low-density lesions within the liver, which may reflect cysts  versus hemangiomas. Gallbladder appears unremarkable. No gallstone. No biliary dilatation. Pancreas: Unremarkable. No pancreatic ductal dilatation or surrounding inflammatory changes. Spleen: Normal in size without focal abnormality. Adrenals/Urinary Tract: Adrenal glands are unremarkable. Kidneys are normal, without renal calculi, focal lesion, or hydronephrosis. Bladder is unremarkable. 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 Enteric contrast is present within small bowel loops and extending into the ileostomy. No evidence of bowel obstruction. Sigmoid diverticulosis. No focal bowel wall thickening or inflammatory changes are seen. Vascular/Lymphatic: Extensive atherosclerotic calcification of the  abdominal aorta and its branches. No abdominopelvic lymphadenopathy. Reproductive: Uterus and bilateral adnexa are unremarkable. Other: No free fluid within the abdomen or pelvis. No pneumoperitoneum. Nodular focus within the subcutaneous soft tissues of the upper abdomen right of midline (series 2, image 11) and right gluteal region (series 2, image 30), which may reflect injection related changes. Musculoskeletal: Stable mild lumbar compression deformities. No new or acute osseous findings. IMPRESSION: 1. No acute findings in the abdomen or pelvis. 2. Extensive postsurgical changes of the bowel. No evidence of bowel obstruction. 3. Sigmoid diverticulosis without evidence of acute diverticulitis. 4. Extensive atherosclerosis. Electronically Signed   By: Davina Poke D.O.   On: 10/31/2019 16:29     Medications:   . sodium chloride 75 mL/hr at 10/31/19 1303   . calcium-vitamin D  0.5 tablet Oral Daily  . feeding supplement (NEPRO CARB STEADY)  237 mL Oral TID BM  . feeding supplement (NEPRO CARB STEADY)  237 mL Oral TID BM  . ferrous sulfate  325 mg Oral BID WC  . fluticasone furoate-vilanterol  1 puff Inhalation Daily  . heparin  5,000 Units Subcutaneous Q8H  . loperamide  2 mg Oral TID  . multivitamin with minerals  1 tablet Oral Daily  . pantoprazole  40 mg Oral BID AC  . sucralfate  1 g Oral QID  . umeclidinium bromide  1 puff Inhalation Daily   acetaminophen, albuterol, butalbital-acetaminophen-caffeine, guaiFENesin-dextromethorphan, hydrOXYzine, traMADol  Assessment/ Plan:  64 y.o. 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  Note: This note was prepared with Dragon dictation. Any transcription errors are unintentional

## 2019-11-01 LAB — CBC
HCT: 23.6 % — ABNORMAL LOW (ref 36.0–46.0)
Hemoglobin: 7.6 g/dL — ABNORMAL LOW (ref 12.0–15.0)
MCH: 31.1 pg (ref 26.0–34.0)
MCHC: 32.2 g/dL (ref 30.0–36.0)
MCV: 96.7 fL (ref 80.0–100.0)
Platelets: 291 10*3/uL (ref 150–400)
RBC: 2.44 MIL/uL — ABNORMAL LOW (ref 3.87–5.11)
RDW: 14.1 % (ref 11.5–15.5)
WBC: 5.2 10*3/uL (ref 4.0–10.5)
nRBC: 0 % (ref 0.0–0.2)

## 2019-11-01 LAB — BASIC METABOLIC PANEL
Anion gap: 8 (ref 5–15)
BUN: 65 mg/dL — ABNORMAL HIGH (ref 8–23)
CO2: 17 mmol/L — ABNORMAL LOW (ref 22–32)
Calcium: 7.9 mg/dL — ABNORMAL LOW (ref 8.9–10.3)
Chloride: 107 mmol/L (ref 98–111)
Creatinine, Ser: 3.5 mg/dL — ABNORMAL HIGH (ref 0.44–1.00)
GFR calc Af Amer: 15 mL/min — ABNORMAL LOW (ref >60)
GFR calc non Af Amer: 13 mL/min — ABNORMAL LOW (ref >60)
Glucose, Bld: 81 mg/dL (ref 70–99)
Potassium: 3.8 mmol/L (ref 3.5–5.1)
Sodium: 132 mmol/L — ABNORMAL LOW (ref 135–145)

## 2019-11-01 LAB — UREA NITROGEN, URINE: Urea Nitrogen, Ur: 435 mg/dL

## 2019-11-01 NOTE — Plan of Care (Signed)
  Problem: Education: Goal: Knowledge of General Education information will improve Description: Including pain rating scale, medication(s)/side effects and non-pharmacologic comfort measures Outcome: Progressing   Problem: Health Behavior/Discharge Planning: Goal: Ability to manage health-related needs will improve Outcome: Progressing   Problem: Activity: Goal: Risk for activity intolerance will decrease Outcome: Progressing   Problem: Elimination: Goal: Will not experience complications related to bowel motility Outcome: Progressing   Problem: Pain Managment: Goal: General experience of comfort will improve Outcome: Progressing

## 2019-11-01 NOTE — Progress Notes (Signed)
[place], [place] 11/01/19  Subjective:   Hospital day # 3  Doing fair Denies any acute complaints States she is able to eat some.  No nausea or vomiting reported.  Improvement in serum creatinine is noted Renal: 01/16 0701 - 01/17 0700 In: 752.9 [I.V.:752.9] Out: 950 [Urine:400; Stool:550] Lab Results  Component Value Date   CREATININE 3.50 (H) 11/01/2019   CREATININE 5.01 (H) 10/31/2019   CREATININE 6.42 (H) 10/30/2019     Objective:  Vital signs in last 24 hours:  Temp:  [97.2 F (36.2 C)-98.4 F (36.9 C)] 98.4 F (36.9 C) (01/17 0809) Pulse Rate:  [79-91] 80 (01/17 0809) Resp:  [18] 18 (01/17 0809) BP: (85-103)/(60-77) 96/65 (01/17 0809) SpO2:  [94 %-99 %] 99 % (01/17 0809) Weight:  [52.5 kg] 52.5 kg (01/17 0357)  Weight change: 2.268 kg Filed Weights   10/30/19 0404 10/31/19 0417 11/01/19 0357  Weight: 50.8 kg 50.3 kg 52.5 kg    Intake/Output:    Intake/Output Summary (Last 24 hours) at 11/01/2019 1318 Last data filed at 11/01/2019 1300 Gross per 24 hour  Intake 1228.65 ml  Output 1250 ml  Net -21.35 ml     Physical Exam: General:  Laying in the bed, no acute distress  HEENT  moist oral mucous membranes  Pulm/lungs  normal breathing effort, mild coarse breath sounds  CVS/Heart  irregular rhythm  Abdomen:   Soft, nontender, colostomy in place  Extremities:  No peripheral edema  Neurologic:  Alert, able to follow some commands  Skin:  No acute rashes    Basic Metabolic Panel:  Recent Labs  Lab 10/29/19 0906 10/29/19 0906 10/29/19 1126 10/29/19 1126 10/30/19 0435 10/31/19 0448 11/01/19 0432  NA 133*  --  130*  --  134* 136 132*  K 4.9  --  5.1  --  3.7 3.8 3.8  CL 97*  --  96*  --  97* 105 107  CO2 17*  --  17*  --  22 19* 17*  GLUCOSE 110*  --  92  --  103* 99 81  BUN 77*  --  75*  --  76* 72* 65*  CREATININE 8.41*  --  8.44*  --  6.42* 5.01* 3.50*  CALCIUM 9.7   < > 9.5   < > 8.6* 8.4* 7.9*  PHOS  --    --   --   --   --  4.9*  --    < > = values in this interval not displayed.     CBC: Recent Labs  Lab 10/29/19 0906 10/29/19 1126 10/30/19 0435 10/31/19 0448 11/01/19 0432  WBC 8.8 8.0 5.8 6.1 5.2  NEUTROABS 6.7  --   --   --   --   HGB 11.2* 11.0* 9.7* 8.9* 7.6*  HCT 35.2* 34.4* 29.3* 26.9* 23.6*  MCV 96.7 96.6 93.3 95.1 96.7  PLT 415* 406* 352 343 291     No results found for: HEPBSAG, HEPBSAB, HEPBIGM    Microbiology:  Recent Results (from the past 240 hour(s))  C difficile quick scan w PCR reflex     Status: None   Collection Time: 10/31/19 12:21 PM   Specimen: Abdomen; Stool  Result Value Ref Range Status   C Diff antigen NEGATIVE NEGATIVE Final   C Diff toxin NEGATIVE NEGATIVE Final   C Diff interpretation No C. difficile detected.  Final    Comment: Performed at [facility], [address], [place], [place] [address]  Coagulation Studies: No results for input(s): LABPROT, INR in the last 72 hours.  Urinalysis: Recent Labs    10/29/19 [place] 1.020  PHURINE 5.0  GLUCOSEU NEGATIVE  HGBUR NEGATIVE  BILIRUBINUR NEGATIVE  KETONESUR NEGATIVE  PROTEINUR NEGATIVE  NITRITE NEGATIVE  LEUKOCYTESUR SMALL*      Imaging: CT ABDOMEN PELVIS WO CONTRAST  Result Date: 10/31/2019 CLINICAL DATA:  Abdominal cramping EXAM: CT ABDOMEN AND PELVIS WITHOUT CONTRAST TECHNIQUE: Multidetector CT imaging of the abdomen and pelvis was performed following the standard protocol without IV contrast. COMPARISON:  10/29/2019 FINDINGS: Lower chest: Lung bases are clear. Heart size is normal. Coronary artery calcifications evident. Hepatobiliary: Stable size and appearance of small low-density lesions within the liver, which may reflect cysts versus hemangiomas. Gallbladder appears unremarkable. No gallstone. No biliary dilatation. Pancreas: Unremarkable. No pancreatic ductal dilatation or surrounding inflammatory changes. Spleen: Normal in size  without focal abnormality. Adrenals/Urinary Tract: Adrenal glands are unremarkable. Kidneys are normal, without renal calculi, focal lesion, or hydronephrosis. Bladder is unremarkable. 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 Enteric contrast is present within small bowel loops and extending into the ileostomy. No evidence of bowel obstruction. Sigmoid diverticulosis. No focal bowel wall thickening or inflammatory changes are seen. Vascular/Lymphatic: Extensive atherosclerotic calcification of the abdominal aorta and its branches. No abdominopelvic lymphadenopathy. Reproductive: Uterus and bilateral adnexa are unremarkable. Other: No free fluid within the abdomen or pelvis. No pneumoperitoneum. Nodular focus within the subcutaneous soft tissues of the upper abdomen right of midline (series 2, image 11) and right gluteal region (series 2, image 30), which may reflect injection related changes. Musculoskeletal: Stable mild lumbar compression deformities. No new or acute osseous findings. IMPRESSION: 1. No acute findings in the abdomen or pelvis. 2. Extensive postsurgical changes of the bowel. No evidence of bowel obstruction. 3. Sigmoid diverticulosis without evidence of acute diverticulitis. 4. Extensive atherosclerosis. Electronically Signed   By: Davina Poke D.O.   On: 10/31/2019 16:29     Medications:   . sodium chloride 75 mL/hr at 11/01/19 0852   . calcium-vitamin D  0.5 tablet Oral Daily  . feeding supplement (NEPRO CARB STEADY)  237 mL Oral TID BM  . ferrous sulfate  325 mg Oral BID WC  . fluticasone furoate-vilanterol  1 puff Inhalation Daily  . heparin  5,000 Units Subcutaneous Q8H  . loperamide  2 mg Oral TID  . multivitamin with minerals  1 tablet Oral Daily  . pantoprazole  40 mg Oral BID AC  . sucralfate  1 g Oral QID  . umeclidinium bromide  1 puff Inhalation Daily   acetaminophen, albuterol,  butalbital-acetaminophen-caffeine, guaiFENesin-dextromethorphan, hydrOXYzine, traMADol  Assessment/ Plan:  64 y.o. 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  Recent Covid infection in December/January.  Loose stools off and on.  Dehydration at admission. Currently nonoliguric Serum creatinine is improving with IV hydration  #Chronic kidney disease stage IIIb Baseline creatinine of 1.7, GFR 36 from October 21, 2019 Underlying CKD is likely secondary to hypertension, atherosclerosis Upcoming outpatient nephrology follow-up appointment with Dr. Holley Raring November 18, 2018  LOS: 3 Danielle [name] 1/17/20211:18 PM  [place], [place]  Note: This note was prepared with Dragon dictation. Any transcription errors are unintentional

## 2019-11-01 NOTE — Plan of Care (Signed)
  Problem: Education: Goal: Knowledge of ostomy care will improve Outcome: Progressing   Problem: Education: Goal: Knowledge of General Education information will improve Description: Including pain rating scale, medication(s)/side effects and non-pharmacologic comfort measures Outcome: Progressing   Problem: Clinical Measurements: Goal: Diagnostic test results will improve Outcome: Progressing   Problem: Activity: Goal: Risk for activity intolerance will decrease Outcome: Progressing

## 2019-11-01 NOTE — Progress Notes (Addendum)
PROGRESS NOTE    Danielle [name]  UEA:[id number] DOB: [birth year] DOA: 10/29/2019 PCP: [facility]       Assessment & Plan:   Principal Problem:   Acute renal failure superimposed on stage 3b chronic kidney disease ([place]) Active Problems:   Moderate COPD (chronic obstructive pulmonary disease) (HCC)   Severe protein-calorie malnutrition (HCC)   Chronic systolic heart failure (HCC)   HTN (hypertension)   Iron deficiency anemia secondary to blood loss (chronic)   GERD (gastroesophageal reflux disease)   Depression   Diarrhea  AKI on CKDIIIb:possibly secondary to prerenal azotemia suspected due to COVID related diarrhea. GI panel pending. C. diff neg. CT abd/pelvis ordered. Baseline creatinine 1.7. Cr continues to trend down. Continue on IVFs. Nephro following and recs apprec  Diarrhea: etiology unclear. GI panel pending. C. diff is neg. 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 Rogue Bussing. Continue w/ nutritional supplements   Moderate COPD: w/o exacerbation. Continue bronchodilators.  Depression: severity unknown. Will hold home dose of citalopram secondary to AKI on CKD  Chronic systolic heart failure :2D echo on 04/21/2018 showed EF 35-40%. Euvolemic. Hold Lasix due to worsening renal function  YNW:[id number] to hold lasix and coreg secondary to low normal BP  Normocytic anemia: etiology unclear as per GI on last admission.  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 Follows with Dr. Windell Moment. Wound care consulted  GERD: continue PPI   Hx of ostomy: 2019 was due to colon ulceration in 2019 at the hepatic flexure, causing bleeding, and not due to ulcerative colitis as per GI on last admission    DVT prophylaxis: heparin Code Status: full  Family Communication:  Disposition Plan:    Consultants:    nephro   Procedures:    Antimicrobials:    Subjective: Danielle [name] c/o fatigue   Objective: Vitals:   10/31/19 0742 10/31/19 1627 10/31/19 1935 11/01/19 0357  BP: 103/73 (!) 85/60 103/77 99/66  Pulse: 87 91 88 79  Resp: 18 18    Temp: 98.3 F (36.8 C) (!) 97.2 F (36.2 C) 97.8 F (36.6 C) 98.3 F (36.8 C)  TempSrc: Oral Oral Oral Oral  SpO2: 100% 94% 99% 98%  Weight:    52.5 kg  Height:        Intake/Output Summary (Last 24 hours) at 11/01/2019 0733 Last data filed at 11/01/2019 0647 Gross per 24 hour  Intake 752.85 ml  Output 950 ml  Net -197.15 ml   Filed Weights   10/30/19 0404 10/31/19 0417 11/01/19 0357  Weight: 50.8 kg 50.3 kg 52.5 kg    Examination:  General exam: Appears calm but uncomfortable  Respiratory system: diminished breath sounds b/l. Cardiovascular system: S1 & S2 +.  No rubs, gallops or clicks.  Gastrointestinal system: Abdomen is nondistended, soft , NT. Ostomy bag in place w/ brown stool. Normal bowel sounds heard. Central nervous system: Alert and oriented. Moves all 4 extremities Psychiatry: Judgement and insight appear normal. Flat mood and affect     Data Reviewed: I have personally reviewed following labs and imaging studies  CBC: Recent Labs  Lab 10/29/19 0906 10/29/19 1126 10/30/19 0435 10/31/19 0448 11/01/19 0432  WBC 8.8 8.0 5.8 6.1 5.2  NEUTROABS 6.7  --   --   --   --   HGB 11.2* 11.0* 9.7* 8.9* 7.6*  HCT 35.2* 34.4* 29.3* 26.9* 23.6*  MCV 96.7 96.6 93.3 95.1 96.7  PLT 415* 406* 352 343 725   Basic Metabolic Panel: Recent Labs  Lab 10/29/19 0906 10/29/19 1126 10/30/19 0435 10/31/19 0448 11/01/19 0432  NA 133* 130* 134* 136 132*  K 4.9 5.1 3.7 3.8 3.8  CL 97* 96* 97* 105 107  CO2 17* 17* 22 19* 17*  GLUCOSE 110* 92 103* 99 81  BUN 77* 75* 76* 72* 65*  CREATININE 8.41* 8.44* 6.42* 5.01* 3.50*  CALCIUM 9.7 9.5 8.6* 8.4* 7.9*  PHOS  --   --   --  4.9*  --    GFR: Estimated Creatinine Clearance: 13.3 mL/min  (A) (by C-G formula based on SCr of 3.5 mg/dL (H)). Liver Function Tests: Recent Labs  Lab 10/29/19 0906  AST 22  ALT 17  ALKPHOS 66  BILITOT 0.6  PROT 9.5*  ALBUMIN 4.5   No results for input(s): LIPASE, AMYLASE in the last 168 hours. No results for input(s): AMMONIA in the last 168 hours. Coagulation Profile: No results for input(s): INR, PROTIME in the last 168 hours. Cardiac Enzymes: No results for input(s): CKTOTAL, CKMB, CKMBINDEX, TROPONINI in the last 168 hours. BNP (last 3 results) No results for input(s): PROBNP in the last 8760 hours. HbA1C: No results for input(s): HGBA1C in the last 72 hours. CBG: Recent Labs  Lab 10/30/19 0729  GLUCAP 96   Lipid Profile: No results for input(s): CHOL, HDL, LDLCALC, TRIG, CHOLHDL, LDLDIRECT in the last 72 hours. Thyroid Function Tests: No results for input(s): TSH, T4TOTAL, FREET4, T3FREE, THYROIDAB in the last 72 hours. Anemia Panel: No results for input(s): VITAMINB12, FOLATE, FERRITIN, TIBC, IRON, RETICCTPCT in the last 72 hours. Sepsis Labs: No results for input(s): PROCALCITON, LATICACIDVEN in the last 168 hours.  Recent Results (from the past 240 hour(s))  C difficile quick scan w PCR reflex     Status: None   Collection Time: 10/31/19 12:21 PM   Specimen: Abdomen; Stool  Result Value Ref Range Status   C Diff antigen NEGATIVE NEGATIVE Final   C Diff toxin NEGATIVE NEGATIVE Final   C Diff interpretation No C. difficile detected.  Final    Comment: Performed at [facility], [address] [place], [place] [address]         Radiology Studies: CT ABDOMEN PELVIS WO CONTRAST  Result Date: 10/31/2019 CLINICAL DATA:  Abdominal cramping EXAM: CT ABDOMEN AND PELVIS WITHOUT CONTRAST TECHNIQUE: Multidetector CT imaging of the abdomen and pelvis was performed following the standard protocol without IV contrast. COMPARISON:  10/29/2019 FINDINGS: Lower chest: Lung bases are clear. Heart size is normal. Coronary  artery calcifications evident. Hepatobiliary: Stable size and appearance of small low-density lesions within the liver, which may reflect cysts versus hemangiomas. Gallbladder appears unremarkable. No gallstone. No biliary dilatation. Pancreas: Unremarkable. No pancreatic ductal dilatation or surrounding inflammatory changes. Spleen: Normal in size without focal abnormality. Adrenals/Urinary Tract: Adrenal glands are unremarkable. Kidneys are normal, without renal calculi, focal lesion, or hydronephrosis. Bladder is unremarkable. 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 Enteric contrast is present within small bowel loops and extending into the ileostomy. No evidence of bowel obstruction. Sigmoid diverticulosis. No focal bowel wall thickening or inflammatory changes are seen. Vascular/Lymphatic: Extensive atherosclerotic calcification of the abdominal aorta and its branches. No abdominopelvic lymphadenopathy. Reproductive: Uterus and bilateral adnexa are unremarkable. Other: No free fluid within the abdomen or pelvis. No pneumoperitoneum. Nodular focus within the subcutaneous soft tissues of the upper abdomen right of midline (  series 2, image 11) and right gluteal region (series 2, image 30), which may reflect injection related changes. Musculoskeletal: Stable mild lumbar compression deformities. No new or acute osseous findings. IMPRESSION: 1. No acute findings in the abdomen or pelvis. 2. Extensive postsurgical changes of the bowel. No evidence of bowel obstruction. 3. Sigmoid diverticulosis without evidence of acute diverticulitis. 4. Extensive atherosclerosis. Electronically Signed   By: Davina Poke D.O.   On: 10/31/2019 16:29        Scheduled Meds: . calcium-vitamin D  0.5 tablet Oral Daily  . feeding supplement (NEPRO CARB STEADY)  237 mL Oral TID BM  . feeding supplement (NEPRO CARB STEADY)  237 mL Oral TID BM  .  ferrous sulfate  325 mg Oral BID WC  . fluticasone furoate-vilanterol  1 puff Inhalation Daily  . heparin  5,000 Units Subcutaneous Q8H  . loperamide  2 mg Oral TID  . multivitamin with minerals  1 tablet Oral Daily  . pantoprazole  40 mg Oral BID AC  . sucralfate  1 g Oral QID  . umeclidinium bromide  1 puff Inhalation Daily   Continuous Infusions: . sodium chloride 75 mL/hr at 11/01/19 0344     LOS: 3 days    Time spent: 30 mins     Wyvonnia Dusky, MD [facility] Pager [phone]  If 7PM-7AM, please contact night-coverage [email or web address] Password [facility] 11/01/2019, 7:33 AM

## 2019-11-02 LAB — CBC
HCT: 21.8 % — ABNORMAL LOW (ref 36.0–46.0)
Hemoglobin: 7.1 g/dL — ABNORMAL LOW (ref 12.0–15.0)
MCH: 31.7 pg (ref 26.0–34.0)
MCHC: 32.6 g/dL (ref 30.0–36.0)
MCV: 97.3 fL (ref 80.0–100.0)
Platelets: 283 10*3/uL (ref 150–400)
RBC: 2.24 MIL/uL — ABNORMAL LOW (ref 3.87–5.11)
RDW: 14.6 % (ref 11.5–15.5)
WBC: 5.2 10*3/uL (ref 4.0–10.5)
nRBC: 0 % (ref 0.0–0.2)

## 2019-11-02 LAB — BASIC METABOLIC PANEL
Anion gap: 5 (ref 5–15)
BUN: 53 mg/dL — ABNORMAL HIGH (ref 8–23)
CO2: 16 mmol/L — ABNORMAL LOW (ref 22–32)
Calcium: 8 mg/dL — ABNORMAL LOW (ref 8.9–10.3)
Chloride: 116 mmol/L — ABNORMAL HIGH (ref 98–111)
Creatinine, Ser: 2.61 mg/dL — ABNORMAL HIGH (ref 0.44–1.00)
GFR calc Af Amer: 21 mL/min — ABNORMAL LOW (ref >60)
GFR calc non Af Amer: 19 mL/min — ABNORMAL LOW (ref >60)
Glucose, Bld: 83 mg/dL (ref 70–99)
Potassium: 4.2 mmol/L (ref 3.5–5.1)
Sodium: 137 mmol/L (ref 135–145)

## 2019-11-02 MED ORDER — CHOLECALCIFEROL 10 MCG (400 UNIT) PO TABS
200.0000 [IU] | ORAL_TABLET | Freq: Every day | ORAL | Status: DC
  Administered 2019-11-02 – 2019-11-04 (×3): 200 [IU] via ORAL
  Filled 2019-11-02 (×3): qty 1

## 2019-11-02 MED ORDER — CALCIUM CARBONATE ANTACID 500 MG PO CHEW
0.5000 | CHEWABLE_TABLET | Freq: Every day | ORAL | Status: DC
  Administered 2019-11-02 – 2019-11-04 (×3): 100 mg via ORAL
  Filled 2019-11-02 (×3): qty 1

## 2019-11-02 NOTE — Consult Note (Signed)
[place] Nurse ostomy follow up Stoma type/location: RMQ ileostomy Stomal assessment/size: 1 1/4" flush with creasing at 3 o;clock to improve seal.  Peristomal assessment: partial thickness tissue losss protected with a barrier ring and extra strip at 3:00 Treatment options for stomal/peristomal skin: barrier ring Output liquid green stool Ostomy pouching: 1pc./convex with barrier ring Education provided: None Enrolled patient in [employer] Discharge program: Yes previous admission [place] team will check as needed for supplies.  Domenic Moras MSN, RN, FNP-BC [facility]ON Wound, Ostomy, Continence Nurse Pager [phone]

## 2019-11-02 NOTE — Progress Notes (Signed)
Pt Hgb at 7.1. Notify prime. Will continue to monitor.

## 2019-11-02 NOTE — Progress Notes (Signed)
PROGRESS NOTE    Danielle [name]  ESP:[id number] DOB: 1956/09/02 DOA: 10/29/2019 PCP: [facility]       Assessment & Plan:   Principal Problem:   Acute renal failure superimposed on stage 3b chronic kidney disease ([place]) Active Problems:   Moderate COPD (chronic obstructive pulmonary disease) (HCC)   Severe protein-calorie malnutrition (HCC)   Chronic systolic heart failure (HCC)   HTN (hypertension)   Iron deficiency anemia secondary to blood loss (chronic)   GERD (gastroesophageal reflux disease)   Depression   Diarrhea  AKI on CKDIIIb:possibly secondary to prerenal azotemia suspected due to COVID related diarrhea. Baseline creatinine 1.7. Cr continues to trend down. Continue on IVFs. Nephro following and recs apprec  Diarrhea: etiology unclear. GI panel pending. C. diff is neg. CT abd/pelvis shows no acute intraabdominal findings   Normocytic anemia: etiology unclear as per GI on last admission.  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 Follows with Dr. Windell Moment. 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 Rogue Bussing. Continue w/ nutritional supplements   Moderate COPD: w/o exacerbation. Continue bronchodilators.  Depression: severity unknown. Will hold home dose of citalopram secondary to AKI on CKD  Chronic systolic heart failure :2D echo on 04/21/2018 showed EF 35-40%. Euvolemic. Hold Lasix due to worsening renal function  [id number] to hold lasix and coreg secondary to low normal BP  GERD: continue PPI   Hx of ostomy: was due to colon ulceration in 2019 at the hepatic flexure, causing bleeding, and not due to ulcerative colitis as per GI on last admission. NOT ulcerative colitis     DVT prophylaxis: heparin Code Status: full  Family Communication:  Disposition Plan:    Consultants:    nephro   Procedures:    Antimicrobials:    Subjective: Pt c/o malaise but feeling better than day prior.  Objective: Vitals:   11/01/19 1523 11/01/19 2047 11/02/19 0508 11/02/19 0732  BP: 93/68 91/63 94/69  103/62  Pulse: 85 83 78 83  Resp: 16 19 18 19   Temp: 97.9 F (36.6 C) 98.1 F (36.7 C) 97.9 F (36.6 C) 98.4 F (36.9 C)  TempSrc: Oral Oral Oral Oral  SpO2: 99% 99% 99% 100%  Weight:   54.8 kg   Height:        Intake/Output Summary (Last 24 hours) at 11/02/2019 0736 Last data filed at 11/02/2019 0700 Gross per 24 hour  Intake 1630.11 ml  Output 775 ml  Net 855.11 ml   Filed Weights   10/31/19 0417 11/01/19 0357 11/02/19 0508  Weight: 50.3 kg 52.5 kg 54.8 kg    Examination:  General exam: Appears calm but uncomfortable  Respiratory system: decreased breath sounds b/l. Cardiovascular system: S1 & S2 +.  No rubs, gallops or clicks.  Gastrointestinal system: Abdomen is nondistended, soft , NT. Ostomy bag in place. Normal bowel sounds heard. Central nervous system: Alert and oriented. Moves all 4 extremities Psychiatry: Judgement and insight appear normal. Flat mood and affect     Data Reviewed: I have personally reviewed following labs and imaging studies  CBC: Recent Labs  Lab 10/29/19 0906 10/29/19 0906 10/29/19 1126 10/30/19 0435 10/31/19 0448 11/01/19 0432 11/02/19 0412  WBC 8.8   < > 8.0 5.8 6.1 5.2 5.2  NEUTROABS 6.7  --   --   --   --   --   --   HGB 11.2*   < >  11.0* 9.7* 8.9* 7.6* 7.1*  HCT 35.2*   < > 34.4* 29.3* 26.9* 23.6* 21.8*  MCV 96.7   < > 96.6 93.3 95.1 96.7 97.3  PLT 415*   < > 406* 352 343 291 283   < > = values in this interval not displayed.   Basic Metabolic Panel: Recent Labs  Lab 10/29/19 1126 10/30/19 0435 10/31/19 0448 11/01/19 0432 11/02/19 0412  NA 130* 134* 136 132* 137  K 5.1 3.7 3.8 3.8 4.2  CL 96* 97* 105 107 116*  CO2 17* 22 19* 17* 16*  GLUCOSE 92 103* 99 81 83  BUN 75* 76* 72* 65* 53*   CREATININE 8.44* 6.42* 5.01* 3.50* 2.61*  CALCIUM 9.5 8.6* 8.4* 7.9* 8.0*  PHOS  --   --  4.9*  --   --    GFR: Estimated Creatinine Clearance: 18.6 mL/min (A) (by C-G formula based on SCr of 2.61 mg/dL (H)). Liver Function Tests: Recent Labs  Lab 10/29/19 0906  AST 22  ALT 17  ALKPHOS 66  BILITOT 0.6  PROT 9.5*  ALBUMIN 4.5   No results for input(s): LIPASE, AMYLASE in the last 168 hours. No results for input(s): AMMONIA in the last 168 hours. Coagulation Profile: No results for input(s): INR, PROTIME in the last 168 hours. Cardiac Enzymes: No results for input(s): CKTOTAL, CKMB, CKMBINDEX, TROPONINI in the last 168 hours. BNP (last 3 results) No results for input(s): PROBNP in the last 8760 hours. HbA1C: No results for input(s): HGBA1C in the last 72 hours. CBG: Recent Labs  Lab 10/30/19 0729  GLUCAP 96   Lipid Profile: No results for input(s): CHOL, HDL, LDLCALC, TRIG, CHOLHDL, LDLDIRECT in the last 72 hours. Thyroid Function Tests: No results for input(s): TSH, T4TOTAL, FREET4, T3FREE, THYROIDAB in the last 72 hours. Anemia Panel: No results for input(s): VITAMINB12, FOLATE, FERRITIN, TIBC, IRON, RETICCTPCT in the last 72 hours. Sepsis Labs: No results for input(s): PROCALCITON, LATICACIDVEN in the last 168 hours.  Recent Results (from the past 240 hour(s))  C difficile quick scan w PCR reflex     Status: None   Collection Time: 10/31/19 12:21 PM   Specimen: Abdomen; Stool  Result Value Ref Range Status   C Diff antigen NEGATIVE NEGATIVE Final   C Diff toxin NEGATIVE NEGATIVE Final   C Diff interpretation No C. difficile detected.  Final    Comment: Performed at [facility], [address] [place], [place] [address]         Radiology Studies: CT ABDOMEN PELVIS WO CONTRAST  Result Date: 10/31/2019 CLINICAL DATA:  Abdominal cramping EXAM: CT ABDOMEN AND PELVIS WITHOUT CONTRAST TECHNIQUE: Multidetector CT imaging of the abdomen and pelvis was  performed following the standard protocol without IV contrast. COMPARISON:  10/29/2019 FINDINGS: Lower chest: Lung bases are clear. Heart size is normal. Coronary artery calcifications evident. Hepatobiliary: Stable size and appearance of small low-density lesions within the liver, which may reflect cysts versus hemangiomas. Gallbladder appears unremarkable. No gallstone. No biliary dilatation. Pancreas: Unremarkable. No pancreatic ductal dilatation or surrounding inflammatory changes. Spleen: Normal in size without focal abnormality. Adrenals/Urinary Tract: Adrenal glands are unremarkable. Kidneys are normal, without renal calculi, focal lesion, or hydronephrosis. Bladder is unremarkable. 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 Enteric contrast is present within small bowel loops and extending into the ileostomy. No evidence of bowel obstruction. Sigmoid diverticulosis. No focal bowel wall thickening or inflammatory changes are seen.  Vascular/Lymphatic: Extensive atherosclerotic calcification of the abdominal aorta and its branches. No abdominopelvic lymphadenopathy. Reproductive: Uterus and bilateral adnexa are unremarkable. Other: No free fluid within the abdomen or pelvis. No pneumoperitoneum. Nodular focus within the subcutaneous soft tissues of the upper abdomen right of midline (series 2, image 11) and right gluteal region (series 2, image 30), which may reflect injection related changes. Musculoskeletal: Stable mild lumbar compression deformities. No new or acute osseous findings. IMPRESSION: 1. No acute findings in the abdomen or pelvis. 2. Extensive postsurgical changes of the bowel. No evidence of bowel obstruction. 3. Sigmoid diverticulosis without evidence of acute diverticulitis. 4. Extensive atherosclerosis. Electronically Signed   By: Davina Poke D.O.   On: 10/31/2019 16:29        Scheduled Meds: .  calcium-vitamin D  0.5 tablet Oral Daily  . feeding supplement (NEPRO CARB STEADY)  237 mL Oral TID BM  . ferrous sulfate  325 mg Oral BID WC  . fluticasone furoate-vilanterol  1 puff Inhalation Daily  . heparin  5,000 Units Subcutaneous Q8H  . loperamide  2 mg Oral TID  . multivitamin with minerals  1 tablet Oral Daily  . pantoprazole  40 mg Oral BID AC  . sucralfate  1 g Oral QID  . umeclidinium bromide  1 puff Inhalation Daily   Continuous Infusions: . sodium chloride 75 mL/hr at 11/02/19 0523     LOS: 4 days    Time spent: 30 mins     Wyvonnia Dusky, MD [facility] Pager [phone]  If 7PM-7AM, please contact night-coverage [email or web address] Password [facility] 11/02/2019, 7:36 AM

## 2019-11-02 NOTE — Evaluation (Signed)
Physical Therapy Evaluation Patient Details Name: Danielle [name] MRN: [id number] DOB: 10-Nov-1955 Today's Date: 11/02/2019   History of Present Illness  Per MD notes: Pt is a 64 y.o. female with medical history significant of hypertension, COPD, GERD, depression with anxiety, breast cancer (mastectomy, chemo and radiation therapy), lung cancer (radiation therapy), CHF with EF 35-40%, iron deficiency anemia, CKD-3b, ileostomy, positive covid 19 Ag on 12/13 and positive covid 19 PCR on 12/30,  who presents with abdominal cramps and right sided back pain.  MD assessment includes: AKI on CKDIIIb, diarrhea, severe malnourishment/protein calorie, COPD, depression, chronic systolic heart failure, HTN, anemia, and h/o ostomy 2019.    Clinical Impression  Pt lethargic initially during the session but gradually became more alert during conversation.  Pt took exta time and effort with bed mobility tasks and transfers but did not require physical assistance and was steady with good control.  Pt ambulated 18' without an AD with slow but steady cadence with SpO2 and HR WNL throughout.  Pt reports feeling weaker than baseline and will benefit from HHPT services upon discharge to safely address deficits listed in patient problem list for decreased caregiver assistance and eventual return to PLOF.     Follow Up Recommendations Home health PT    Equipment Recommendations  None recommended by PT    Recommendations for Other Services       Precautions / Restrictions Precautions Precautions: None Restrictions Weight Bearing Restrictions: No Other Position/Activity Restrictions: Watch Hgb; Pt with ostomy bag      Mobility  Bed Mobility Overal bed mobility: Modified Independent             General bed mobility comments: Extra time and effort with bed mobility tasks but no physical assistance required  Transfers Overall transfer level: Needs assistance Equipment used: None Transfers: Sit to/from  Stand Sit to Stand: Supervision;From elevated surface         General transfer comment: Extra effort to stand from an elevated EOB but steady without LOB  Ambulation/Gait Ambulation/Gait assistance: Supervision Gait Distance (Feet): 60 Feet Assistive device: None Gait Pattern/deviations: Step-through pattern;Decreased stride length Gait velocity: decreased   General Gait Details: Slow cadence with amb without an AD but steady without LOB including during sharp turns and navigating around obstacles  Stairs            Wheelchair Mobility    Modified Rankin (Stroke Patients Only)       Balance Overall balance assessment: No apparent balance deficits (not formally assessed) Sitting-balance support: Feet supported Sitting balance-Leahy Scale: Good     Standing balance support: No upper extremity supported;During functional activity Standing balance-Leahy Scale: Good                               Pertinent Vitals/Pain Pain Assessment: No/denies pain    Home Living Family/patient expects to be discharged to:: Private residence Living Arrangements: Children Available Help at Discharge: Family;Available PRN/intermittently Type of Home: Mobile home Home Access: Stairs to enter Entrance Stairs-Rails: Right Entrance Stairs-Number of Steps: 5 Home Layout: One level Home Equipment: Walker - 2 wheels;Bedside commode      Prior Function Level of Independence: Independent         Comments: Pt Ind with amb without an AD with no fall history, Ind with ADLs     Hand Dominance        Extremity/Trunk Assessment   Upper Extremity Assessment Upper Extremity Assessment:  Generalized weakness    Lower Extremity Assessment Lower Extremity Assessment: Generalized weakness       Communication   Communication: No difficulties  Cognition Arousal/Alertness: Awake/alert Behavior During Therapy: WFL for tasks assessed/performed Overall Cognitive Status:  Within Functional Limits for tasks assessed                                        General Comments      Exercises Total Joint Exercises Ankle Circles/Pumps: Strengthening;Both;10 reps Quad Sets: Strengthening;Both;10 reps Gluteal Sets: Strengthening;Both;10 reps Towel Squeeze: Strengthening;Both;10 reps Hip ABduction/ADduction: AROM;Both;10 reps Long Arc Quad: Strengthening;Both;10 reps Knee Flexion: Strengthening;Both;10 reps Marching in Standing: Strengthening;Both;10 reps;Standing   Assessment/Plan    PT Assessment Patient needs continued PT services  PT Problem List Decreased strength;Decreased activity tolerance;Decreased mobility       PT Treatment Interventions Therapeutic exercise;Gait training;Functional mobility training;Therapeutic activities;Patient/family education    PT Goals (Current goals can be found in the Care Plan section)  Acute Rehab PT Goals Patient Stated Goal: To feel better and get back home PT Goal Formulation: With patient Time For Goal Achievement: 11/15/19 Potential to Achieve Goals: Good    Frequency Min 2X/week   Barriers to discharge        Co-evaluation               AM-PAC PT "6 Clicks" Mobility  Outcome Measure Help needed turning from your back to your side while in a flat bed without using bedrails?: None Help needed moving from lying on your back to sitting on the side of a flat bed without using bedrails?: None Help needed moving to and from a bed to a chair (including a wheelchair)?: A Little Help needed standing up from a chair using your arms (e.g., wheelchair or bedside chair)?: A Little Help needed to walk in hospital room?: A Little Help needed climbing 3-5 steps with a railing? : A Little 6 Click Score: 20    End of Session Equipment Utilized During Treatment: Gait belt;Other (comment)(Under arms to avoid ostomy bag) Activity Tolerance: Patient tolerated treatment well Patient left: in  chair;with call bell/phone within reach Nurse Communication: Mobility status PT Visit Diagnosis: Difficulty in walking, not elsewhere classified (R26.2);Muscle weakness (generalized) (M62.81)    Time: 5361-4431 PT Time Calculation (min) (ACUTE ONLY): 23 min   Charges:   PT Evaluation $PT Eval Moderate Complexity: 1 Mod PT Treatments $Therapeutic Exercise: 8-22 mins        D. Scott Vikrant Pryce PT, DPT 11/02/19, 11:54 AM

## 2019-11-03 ENCOUNTER — Inpatient Hospital Stay: Payer: Medicare Other

## 2019-11-03 ENCOUNTER — Other Ambulatory Visit: Payer: Self-pay | Admitting: Internal Medicine

## 2019-11-03 ENCOUNTER — Encounter: Payer: Self-pay | Admitting: Internal Medicine

## 2019-11-03 DIAGNOSIS — D631 Anemia in chronic kidney disease: Secondary | ICD-10-CM

## 2019-11-03 DIAGNOSIS — D649 Anemia, unspecified: Secondary | ICD-10-CM

## 2019-11-03 LAB — BASIC METABOLIC PANEL
Anion gap: 7 (ref 5–15)
BUN: 43 mg/dL — ABNORMAL HIGH (ref 8–23)
CO2: 15 mmol/L — ABNORMAL LOW (ref 22–32)
Calcium: 8.2 mg/dL — ABNORMAL LOW (ref 8.9–10.3)
Chloride: 119 mmol/L — ABNORMAL HIGH (ref 98–111)
Creatinine, Ser: 2.35 mg/dL — ABNORMAL HIGH (ref 0.44–1.00)
GFR calc Af Amer: 24 mL/min — ABNORMAL LOW (ref >60)
GFR calc non Af Amer: 21 mL/min — ABNORMAL LOW (ref >60)
Glucose, Bld: 80 mg/dL (ref 70–99)
Potassium: 4.7 mmol/L (ref 3.5–5.1)
Sodium: 141 mmol/L (ref 135–145)

## 2019-11-03 LAB — MULTIPLE MYELOMA PANEL, SERUM
Albumin SerPl Elph-Mcnc: 4 g/dL (ref 2.9–4.4)
Albumin/Glob SerPl: 0.9 (ref 0.7–1.7)
Alpha 1: 0.4 g/dL (ref 0.0–0.4)
Alpha2 Glob SerPl Elph-Mcnc: 1.4 g/dL — ABNORMAL HIGH (ref 0.4–1.0)
B-Globulin SerPl Elph-Mcnc: 1.3 g/dL (ref 0.7–1.3)
Gamma Glob SerPl Elph-Mcnc: 1.5 g/dL (ref 0.4–1.8)
Globulin, Total: 4.6 g/dL — ABNORMAL HIGH (ref 2.2–3.9)
IgA: 239 mg/dL (ref 87–352)
IgG (Immunoglobin G), Serum: 1730 mg/dL — ABNORMAL HIGH (ref 586–1602)
IgM (Immunoglobulin M), Srm: 50 mg/dL (ref 26–217)
M Protein SerPl Elph-Mcnc: 1 g/dL — ABNORMAL HIGH
Total Protein ELP: 8.6 g/dL — ABNORMAL HIGH (ref 6.0–8.5)

## 2019-11-03 LAB — CBC
HCT: 22.9 % — ABNORMAL LOW (ref 36.0–46.0)
Hemoglobin: 7.3 g/dL — ABNORMAL LOW (ref 12.0–15.0)
MCH: 31.6 pg (ref 26.0–34.0)
MCHC: 31.9 g/dL (ref 30.0–36.0)
MCV: 99.1 fL (ref 80.0–100.0)
Platelets: 287 10*3/uL (ref 150–400)
RBC: 2.31 MIL/uL — ABNORMAL LOW (ref 3.87–5.11)
RDW: 15 % (ref 11.5–15.5)
WBC: 6.2 10*3/uL (ref 4.0–10.5)
nRBC: 0 % (ref 0.0–0.2)

## 2019-11-03 LAB — PREPARE RBC (CROSSMATCH)

## 2019-11-03 MED ORDER — EPOETIN ALFA-EPBX 10000 UNIT/ML IJ SOLN
20000.0000 [IU] | Freq: Once | INTRAMUSCULAR | Status: AC
  Administered 2019-11-03: 16:00:00 20000 [IU] via SUBCUTANEOUS
  Filled 2019-11-03: qty 2

## 2019-11-03 MED ORDER — HEPARIN SOD (PORK) LOCK FLUSH 100 UNIT/ML IV SOLN
250.0000 [IU] | INTRAVENOUS | Status: DC | PRN
  Filled 2019-11-03: qty 3

## 2019-11-03 MED ORDER — SODIUM CHLORIDE 0.9% FLUSH: 10.0000 mL | INTRAVENOUS | Status: DC | PRN

## 2019-11-03 MED ORDER — SODIUM CHLORIDE 0.9% FLUSH: 3.0000 mL | INTRAVENOUS | Status: DC | PRN

## 2019-11-03 MED ORDER — SODIUM CHLORIDE 0.9% IV SOLUTION
250.0000 mL | Freq: Once | INTRAVENOUS | Status: AC
  Administered 2019-11-03: 250 mL via INTRAVENOUS

## 2019-11-03 MED ORDER — HEPARIN SOD (PORK) LOCK FLUSH 100 UNIT/ML IV SOLN
500.0000 [IU] | Freq: Every day | INTRAVENOUS | Status: DC | PRN
  Filled 2019-11-03: qty 5

## 2019-11-03 NOTE — Clinical Social Work Note (Signed)
Pt refusing [employer] services.   [place], [place]

## 2019-11-03 NOTE — Progress Notes (Addendum)
PROGRESS NOTE    Danielle [name]  GGE:[id number] DOB: Jan 24, 1956 DOA: 10/29/2019 PCP: [facility]       Assessment & Plan:   Principal Problem:   Acute renal failure superimposed on stage 3b chronic kidney disease ([place]) Active Problems:   Moderate COPD (chronic obstructive pulmonary disease) (HCC)   Severe protein-calorie malnutrition (HCC)   Chronic systolic heart failure (HCC)   HTN (hypertension)   Iron deficiency anemia secondary to blood loss (chronic)   GERD (gastroesophageal reflux disease)   Depression   Diarrhea  AKI on CKDIIIb:possibly secondary to prerenal/ATN suspected due to COVID related diarrhea. Baseline creatinine 1.7. Cr continues to trend down daily. Continue on IVFs. Nephro following and recs apprec  Diarrhea: etiology unclear. GI panel pending. C. diff is neg. CT abd/pelvis shows no acute intraabdominal findings   Normocytic anemia: etiology unclear as per GI on last admission.  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 Follows with Dr. Windell Moment. Will transfuse 1 unit of PRBCs today. Will start epo as per heme/nephro. 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 Rogue Bussing. Continue w/ nutritional supplements   Moderate COPD: w/o exacerbation. Continue bronchodilators.  Hx of COVID19 pneumonia: received remdesivir, steroid course on previous admission in 2020. Still positive on 10/14/19. No repeat COVID19 test was done this admission  Depression: severity unknown. Will hold home dose of citalopram secondary to AKI on CKD  Chronic systolic heart failure :2D echo on 04/21/2018 showed EF 35-40%. Euvolemic. Hold Lasix due to worsening renal function  [id number] to hold lasix and coreg secondary to low normal BP  GERD: continue PPI   Hx of ostomy: was due to colon ulceration in 2019 at the hepatic flexure, causing  bleeding, and not due to ulcerative colitis as per GI on last admission. NOT ulcerative colitis. Wound care following and recs apprec\  Generalized weakness: PT consulted   DVT prophylaxis: heparin Code Status: full  Family Communication:  Disposition Plan:    Consultants:   nephro   Procedures:    Antimicrobials:    Subjective: Pt c/o shortness of breath intermittently  Objective: Vitals:   11/02/19 1507 11/02/19 1938 11/03/19 0504 11/03/19 0828  BP: 111/63 118/65 104/66 107/64  Pulse: 82 85 80 84  Resp: 19 20 19 16   Temp: 98 F (36.7 C) 98.2 F (36.8 C) 98.5 F (36.9 C) 98.6 F (37 C)  TempSrc: Oral Oral Oral Oral  SpO2: 100% 98% 97% 99%  Weight:   56.9 kg   Height:        Intake/Output Summary (Last 24 hours) at 11/03/2019 1239 Last data filed at 11/03/2019 0845 Gross per 24 hour  Intake --  Output 1300 ml  Net -1300 ml   Filed Weights   11/01/19 0357 11/02/19 0508 11/03/19 0504  Weight: 52.5 kg 54.8 kg 56.9 kg    Examination:  General exam: Appears calm & comfortable  Respiratory system: decreaded breath sounds b/l. Cardiovascular system: S1 & S2 +.  No rubs, gallops or clicks.  Gastrointestinal system: Abdomen is nondistended, soft , NT. Ostomy bag in place. Normal bowel sounds heard. Central nervous system: Alert and oriented. Moves all 4 extremities Psychiatry: Judgement and insight appear normal. Flat mood and affect     Data Reviewed: I have personally reviewed following labs and imaging studies  CBC: Recent Labs  Lab 10/29/19 0906 10/29/19 1126 10/30/19 0435 10/31/19 0448 11/01/19 0432 11/02/19 0412 11/03/19  0610  WBC 8.8   < > 5.8 6.1 5.2 5.2 6.2  NEUTROABS 6.7  --   --   --   --   --   --   HGB 11.2*   < > 9.7* 8.9* 7.6* 7.1* 7.3*  HCT 35.2*   < > 29.3* 26.9* 23.6* 21.8* 22.9*  MCV 96.7   < > 93.3 95.1 96.7 97.3 99.1  PLT 415*   < > 352 343 291 283 287   < > = values in this interval not displayed.   Basic Metabolic  Panel: Recent Labs  Lab 10/30/19 0435 10/31/19 0448 11/01/19 0432 11/02/19 0412 11/03/19 0610  NA 134* 136 132* 137 141  K 3.7 3.8 3.8 4.2 4.7  CL 97* 105 107 116* 119*  CO2 22 19* 17* 16* 15*  GLUCOSE 103* 99 81 83 80  BUN 76* 72* 65* 53* 43*  CREATININE 6.42* 5.01* 3.50* 2.61* 2.35*  CALCIUM 8.6* 8.4* 7.9* 8.0* 8.2*  PHOS  --  4.9*  --   --   --    GFR: Estimated Creatinine Clearance: 21.4 mL/min (A) (by C-G formula based on SCr of 2.35 mg/dL (H)). Liver Function Tests: Recent Labs  Lab 10/29/19 0906  AST 22  ALT 17  ALKPHOS 66  BILITOT 0.6  PROT 9.5*  ALBUMIN 4.5   No results for input(s): LIPASE, AMYLASE in the last 168 hours. No results for input(s): AMMONIA in the last 168 hours. Coagulation Profile: No results for input(s): INR, PROTIME in the last 168 hours. Cardiac Enzymes: No results for input(s): CKTOTAL, CKMB, CKMBINDEX, TROPONINI in the last 168 hours. BNP (last 3 results) No results for input(s): PROBNP in the last 8760 hours. HbA1C: No results for input(s): HGBA1C in the last 72 hours. CBG: Recent Labs  Lab 10/30/19 0729  GLUCAP 96   Lipid Profile: No results for input(s): CHOL, HDL, LDLCALC, TRIG, CHOLHDL, LDLDIRECT in the last 72 hours. Thyroid Function Tests: No results for input(s): TSH, T4TOTAL, FREET4, T3FREE, THYROIDAB in the last 72 hours. Anemia Panel: No results for input(s): VITAMINB12, FOLATE, FERRITIN, TIBC, IRON, RETICCTPCT in the last 72 hours. Sepsis Labs: No results for input(s): PROCALCITON, LATICACIDVEN in the last 168 hours.  Recent Results (from the past 240 hour(s))  C difficile quick scan w PCR reflex     Status: None   Collection Time: 10/31/19 12:21 PM   Specimen: Abdomen; Stool  Result Value Ref Range Status   C Diff antigen NEGATIVE NEGATIVE Final   C Diff toxin NEGATIVE NEGATIVE Final   C Diff interpretation No C. difficile detected.  Final    Comment: Performed at [facility], [address],  [place], [place] [address]         Radiology Studies: No results found.      Scheduled Meds: . sodium chloride  250 mL Intravenous Once  . calcium carbonate  0.5 tablet Oral Daily   And  . cholecalciferol  200 Units Oral Daily  . epoetin alfa-epbx (RETACRIT) injection  20,000 Units Subcutaneous Once  . feeding supplement (NEPRO CARB STEADY)  237 mL Oral TID BM  . ferrous sulfate  325 mg Oral BID WC  . fluticasone furoate-vilanterol  1 puff Inhalation Daily  . heparin  5,000 Units Subcutaneous Q8H  . loperamide  2 mg Oral TID  . multivitamin with minerals  1 tablet Oral Daily  . pantoprazole  40 mg Oral BID AC  . sucralfate  1 g Oral QID  .  umeclidinium bromide  1 puff Inhalation Daily   Continuous Infusions: . sodium chloride 75 mL/hr at 11/02/19 1754     LOS: 5 days    Time spent: 33 mins     Wyvonnia Dusky, MD [facility] Pager [phone]  If 7PM-7AM, please contact night-coverage [email or web address] Password [email or web address] 11/03/2019, 12:39 PM

## 2019-11-03 NOTE — Consult Note (Signed)
[place] NOTE  Patient Care Team: [facility] [place] as PCP - General ([place]) [name], FNP as Nurse Practitioner (Family Medicine) [name], NP as Nurse Practitioner (Nurse Practitioner) [name], RN as Registered Nurse [name], RN as Registered Nurse [name], RN as Oncology Nurse Navigator  CHIEF COMPLAINTS/PURPOSE OF CONSULTATION: Severe anemia  HISTORY OF PRESENTING ILLNESS:  Danielle [name] 64 y.o.  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   Patient was recently evaluated in the clinic for history of MGUS/multiple malignancies-which was noted to have a significantly elevated around 8 above her baseline.  Patient was admitted to hospital for further work-up and evaluation.  Since being hospitalized-with IV fluids/supportive care her renal function is improving.  However she noted to have worsening anemia.  Hemoccult is positive however iron studies/clinical picture suggestive of alternative cause of anemia/rather than GI bleed.  Hematology has been consulted for further evaluation recommendations.  Patient hemoglobin has been around 7.5.  Patient has not had any blood transfusion at this admission.  Patient is able to make more urine at this time.  She currently on IV fluids.  Followed closely by nephrology.  Review of Systems  Constitutional: Positive for malaise/fatigue and weight loss. Negative for chills, diaphoresis and fever.  HENT: Negative for nosebleeds and sore throat.   Eyes: Negative for double vision.  Respiratory: Positive for cough and shortness of breath. Negative for hemoptysis and wheezing.   Cardiovascular: Negative for chest pain, palpitations, orthopnea and leg swelling.   Gastrointestinal: Positive for abdominal pain and diarrhea. Negative for blood in stool, constipation, heartburn, melena, nausea and vomiting.  Genitourinary: Negative for dysuria, frequency and urgency.  Musculoskeletal: Negative for back pain and joint pain.  Skin: Negative.  Negative for itching and rash.  Neurological: Negative for dizziness, tingling, focal weakness, weakness and headaches.  Endo/Heme/Allergies: Does not bruise/bleed easily.  Psychiatric/Behavioral: Negative for depression. The patient is not nervous/anxious and does not have insomnia.      MEDICAL HISTORY:  Past Medical History:  Diagnosis Date  . Anxiety   . Arthritis   . Breast cancer ([place]) 2000  . Cancer ([place]) left    breast cancer 2000, chemo tx's with total mastectomy and lymph nodes resected.   . Cancer of right lung ([place]) 02/21/2016   rad tx's.   . CHF (congestive heart failure) ([place])   . COPD (chronic obstructive pulmonary disease) ([place])   . Dependence on supplemental oxygen   . Depression   . Heart murmur   . Hypertension   . Lung nodule   . Lymphedema   . Personal history of chemotherapy   . Personal history of radiation therapy   . Shortness of breath dyspnea    with exertion  . Status post chemotherapy 2001   left breast cancer  . Status post radiation therapy 2001   left breast cancer    SURGICAL HISTORY: Past Surgical History:  Procedure Laterality Date  . Breast Biospy Left    [facility]  . BREAST SURGERY    . COLONOSCOPY N/A 04/30/2018   Procedure: COLONOSCOPY;  Surgeon: Virgel Manifold, MD;  Location: [facility] ENDOSCOPY;  Service: Endoscopy;  Laterality: N/A;  . COLONOSCOPY N/A 07/22/2018   Procedure: COLONOSCOPY;  Surgeon: Virgel Manifold, MD;  Location: [facility] ENDOSCOPY;  Service: Endoscopy;  Laterality: N/A;  . DILATION AND CURETTAGE OF UTERUS    . ELECTROMAGNETIC NAVIGATION BROCHOSCOPY Right 04/11/2016   Procedure: ELECTROMAGNETIC NAVIGATION BRONCHOSCOPY;  Surgeon: Vilinda Boehringer,  MD;  Location: [facility] ORS;  Service: Cardiopulmonary;  Laterality: Right;  . ESOPHAGOGASTRODUODENOSCOPY N/A 07/22/2018   Procedure: ESOPHAGOGASTRODUODENOSCOPY (EGD);  Surgeon: Virgel Manifold, MD;  Location: [facility] ENDOSCOPY;  Service: Endoscopy;  Laterality: N/A;  . ESOPHAGOGASTRODUODENOSCOPY (EGD) WITH PROPOFOL N/A 05/07/2018   Procedure: ESOPHAGOGASTRODUODENOSCOPY (EGD) WITH PROPOFOL;  Surgeon: Lucilla Lame, MD;  Location: [facility] ENDOSCOPY;  Service: Endoscopy;  Laterality: N/A;  . ESOPHAGOGASTRODUODENOSCOPY (EGD) WITH PROPOFOL N/A 04/24/2019   Procedure: ESOPHAGOGASTRODUODENOSCOPY (EGD) WITH PROPOFOL;  Surgeon: Jonathon Bellows, MD;  Location: [facility] ENDOSCOPY;  Service: Gastroenterology;  Laterality: N/A;  . EUS N/A 05/07/2019   Procedure: FULL UPPER ENDOSCOPIC ULTRASOUND (EUS) RADIAL;  Surgeon: Jola Schmidt, MD;  Location: [facility] ENDOSCOPY;  Service: Endoscopy;  Laterality: N/A;  . history of colonoscopy]    . ILEOSCOPY N/A 07/22/2018   Procedure: ILEOSCOPY THROUGH STOMA;  Surgeon: Virgel Manifold, MD;  Location: [facility] ENDOSCOPY;  Service: Endoscopy;  Laterality: N/A;  . ILEOSTOMY    . ILEOSTOMY N/A 09/08/2018   Procedure: ILEOSTOMY REVISION POSSIBLE CREATION;  Surgeon: Herbert Pun, MD;  Location: [facility] ORS;  Service: General;  Laterality: N/A;  . ILEOSTOMY CLOSURE N/A 08/15/2018   Procedure: DILATION OF ILEOSTOMY STRICTURE;  Surgeon: Herbert Pun, MD;  Location: [facility] ORS;  Service: General;  Laterality: N/A;  . LAPAROTOMY Right 05/04/2018   Procedure: EXPLORATORY LAPAROTOMY right colectomy right and left ostomy;  Surgeon: Herbert Pun, MD;  Location: [facility] ORS;  Service: General;  Laterality: Right;  . LUNG BIOPSY    . MASTECTOMY Left    2000, [facility]  . ROTATOR CUFF REPAIR Right    [facility]    SOCIAL HISTORY: Social History   Socioeconomic History  . Marital status: Divorced    Spouse name: Not on file  . Number of children: 3  . Years of education: Not on file  .  Highest education level: Not on file  Occupational History  . Occupation: Retired   Tobacco Use  . Smoking status: Former Smoker    Packs/day: 0.50    Years: 20.00    Pack years: 10.00    Types: Cigarettes    Quit date: 07/02/2012    Years since quitting: 7.3  . Smokeless tobacco: Current User    Types: Snuff  . Tobacco comment: quit 2014  Substance and Sexual Activity  . Alcohol use: Not Currently    Comment: Occasionally  . Drug use: No  . Sexual activity: Not Currently  Other Topics Concern  . Not on file  Social History Narrative  . Not on file   Social Determinants of Health   Financial Resource Strain:   . Difficulty of Paying Living Expenses: Not on file  Food Insecurity:   . Worried About Charity fundraiser in the Last Year: Not on file  . Ran Out of Food in the Last Year: Not on file  Transportation Needs:   . Lack of Transportation (Medical): Not on file  . Lack of Transportation (Non-Medical): Not on file  Physical Activity:   . Days of Exercise per Week: Not on file  . Minutes of Exercise per Session: Not on file  Stress:   . Feeling of Stress : Not on file  Social Connections:   . Frequency of Communication with Friends and Family: Not on file  .  Frequency of Social Gatherings with Friends and Family: Not on file  . Attends Religious Services: Not on file  . Active Member of Clubs or Organizations: Not on file  . Attends Archivist Meetings: Not on file  . Marital Status: Not on file  Intimate Partner Violence:   . Fear of Current or Ex-Partner: Not on file  . Emotionally Abused: Not on file  . Physically Abused: Not on file  . Sexually Abused: Not on file    FAMILY HISTORY: Family History  Problem Relation Age of Onset  . Breast cancer Mother 37  . Cancer Mother        Breast   . Cirrhosis Father   . Breast cancer Paternal Aunt 47  . Cancer Maternal Aunt        Breast     ALLERGIES:  has No Known Allergies.  MEDICATIONS:   Current Facility-Administered Medications  Medication Dose Route Frequency Provider Last Rate Last Admin  . 0.9 %  sodium chloride infusion (Manually program via Guardrails IV Fluids)  250 mL Intravenous Once Charlaine Dalton R, MD      . 0.9 %  sodium chloride infusion   Intravenous Continuous Fritzi Mandes, MD 75 mL/hr at 11/02/19 1754 New Bag at 11/02/19 1754  . acetaminophen (TYLENOL) tablet 650 mg  650 mg Oral Q6H PRN Ivor Costa, MD   650 mg at 10/31/19 1525  . albuterol (VENTOLIN HFA) 108 (90 Base) MCG/ACT inhaler 2 puff  2 puff Inhalation Q6H PRN Ivor Costa, MD      . butalbital-acetaminophen-caffeine (FIORICET) [phone] MG per tablet 1 tablet  1 tablet Oral Q4H PRN Sharion Settler, NP   1 tablet at 11/01/19 2031  . calcium carbonate (TUMS - dosed in mg elemental calcium) chewable tablet 100 mg of elemental calcium  0.5 tablet Oral Daily Lu Duffel, RPH   100 mg of elemental calcium at 11/03/19 0830   And  . cholecalciferol (VITAMIN D3) tablet 200 Units  200 Units Oral Daily Lu Duffel, RPH   200 Units at 11/03/19 0830  . epoetin alfa-epbx (RETACRIT) injection 20,000 Units  20,000 Units Subcutaneous Once Charlaine Dalton R, MD      . feeding supplement (NEPRO CARB STEADY) liquid 237 mL  237 mL Oral TID BM Fritzi Mandes, MD   237 mL at 11/03/19 0830  . ferrous sulfate tablet 325 mg  325 mg Oral BID WC Ivor Costa, MD   325 mg at 11/03/19 0830  . fluticasone furoate-vilanterol (BREO ELLIPTA) 100-25 MCG/INH 1 puff  1 puff Inhalation Daily Lu Duffel, RPH   1 puff at 11/03/19 6962  . guaiFENesin-dextromethorphan (ROBITUSSIN DM) 100-10 MG/5ML syrup 5 mL  5 mL Oral Q4H PRN Ivor Costa, MD      . heparin injection 5,000 Units  5,000 Units Subcutaneous Q8H Ivor Costa, MD   5,000 Units at 11/02/19 2038  . heparin lock flush 100 unit/mL  500 Units Intracatheter Daily PRN Charlaine Dalton R, MD      . heparin lock flush 100 unit/mL  250 Units Intracatheter PRN  [name], MD      . hydrOXYzine (ATARAX/VISTARIL) tablet 10 mg  10 mg Oral TID PRN Ivor Costa, MD      . loperamide (IMODIUM) capsule 2 mg  2 mg Oral TID Fritzi Mandes, MD   2 mg at 11/03/19 0830  . multivitamin with minerals tablet 1 tablet  1 tablet Oral Daily Ivor Costa, MD   1  tablet at 11/03/19 0830  . pantoprazole (PROTONIX) EC tablet 40 mg  40 mg Oral BID AC Ivor Costa, MD   40 mg at 11/03/19 0830  . sodium chloride flush (NS) 0.9 % injection 10 mL  10 mL Intracatheter PRN Charlaine Dalton R, MD      . sodium chloride flush (NS) 0.9 % injection 3 mL  3 mL Intracatheter PRN Charlaine Dalton R, MD      . sucralfate (CARAFATE) 1 GM/10ML suspension 1 g  1 g Oral QID Ivor Costa, MD   1 g at 11/03/19 0830  . traMADol (ULTRAM) tablet 50 mg  50 mg Oral Q12H PRN Wyvonnia Dusky, MD   50 mg at 10/31/19 1203  . umeclidinium bromide (INCRUSE ELLIPTA) 62.5 MCG/INH 1 puff  1 puff Inhalation Daily Lu Duffel, RPH   1 puff at 11/03/19 3794      .  PHYSICAL EXAMINATION:  Vitals:   11/03/19 0504 11/03/19 0828  BP: 104/66 107/64  Pulse: 80 84  Resp: 19 16  Temp: 98.5 F (36.9 C) 98.6 F (37 C)  SpO2: 97% 99%   Filed Weights   11/01/19 0357 11/02/19 0508 11/03/19 0504  Weight: 115 lb 12.8 oz (52.5 kg) 120 lb 12.8 oz (54.8 kg) 125 lb 8 oz (56.9 kg)    Physical Exam  Constitutional: She is oriented to person, place, and time.  Thin built African-American female patient.  Resting comfortably patient alone.  HENT:  Head: Normocephalic and atraumatic.  Mouth/Throat: Oropharynx is clear and moist. No oropharyngeal exudate.  Eyes: Pupils are equal, round, and reactive to light.  Cardiovascular: Normal rate and regular rhythm.  Pulmonary/Chest: No respiratory distress. She has no wheezes.  Decreased air entry bilaterally.  No wheeze.  Abdominal: Soft. Bowel sounds are normal. She exhibits no distension and no mass. There is no abdominal tenderness. There is no  rebound and no guarding.  Positive for ostomy.  Loose stool noted.  Musculoskeletal:        General: No tenderness or edema. Normal range of motion.     Cervical back: Normal range of motion and neck supple.  Neurological: She is alert and oriented to person, place, and time.  Skin: Skin is warm.  Psychiatric: Affect normal.     LABORATORY DATA:  I have reviewed the data as listed Lab Results  Component Value Date   WBC 6.2 11/03/2019   HGB 7.3 (L) 11/03/2019   HCT 22.9 (L) 11/03/2019   MCV 99.1 11/03/2019   PLT 287 11/03/2019   Recent Labs    10/02/19 3276 10/02/19 1470 10/13/19 2345 10/14/19 0242 10/29/19 0906 10/29/19 1126 11/01/19 0432 11/02/19 0412 11/03/19 0610  NA 139   < > 126*   < > 133*   < > 132* 137 141  K 3.4*   < > 7.2*   < > 4.9   < > 3.8 4.2 4.7  CL 106   < > 106   < > 97*   < > 107 116* 119*  CO2 23   < > 12*   < > 17*   < > 17* 16* 15*  GLUCOSE 141*   < > 104*   < > 110*   < > 81 83 80  BUN 22   < > 96*   < > 77*   < > 65* 53* 43*  CREATININE 2.12*   < > 4.83*   < > 8.41*   < > 3.50* 2.61*  2.35*  CALCIUM 7.4*   < > 10.3   < > 9.7   < > 7.9* 8.0* 8.2*  GFRNONAA 24*   < > 9*   < > 5*   < > 13* 19* 21*  GFRAA 28*   < > 10*   < > 5*   < > 15* 21* 24*  PROT 5.7*  --  8.7*  --  9.5*  --   --   --   --   ALBUMIN 3.0*  --  4.2  --  4.5  --   --   --   --   AST 23  --  15  --  22  --   --   --   --   ALT 22  --  13  --  17  --   --   --   --   ALKPHOS 36*  --  68  --  66  --   --   --   --   BILITOT 0.6  --  0.8  --  0.6  --   --   --   --    < > = values in this interval not displayed.    RADIOGRAPHIC STUDIES: I have personally reviewed the radiological images as listed and agreed with the findings in the report. CT ABDOMEN PELVIS WO CONTRAST  Result Date: 10/31/2019 CLINICAL DATA:  Abdominal cramping EXAM: CT ABDOMEN AND PELVIS WITHOUT CONTRAST TECHNIQUE: Multidetector CT imaging of the abdomen and pelvis was performed following the standard protocol  without IV contrast. COMPARISON:  10/29/2019 FINDINGS: Lower chest: Lung bases are clear. Heart size is normal. Coronary artery calcifications evident. Hepatobiliary: Stable size and appearance of small low-density lesions within the liver, which may reflect cysts versus hemangiomas. Gallbladder appears unremarkable. No gallstone. No biliary dilatation. Pancreas: Unremarkable. No pancreatic ductal dilatation or surrounding inflammatory changes. Spleen: Normal in size without focal abnormality. Adrenals/Urinary Tract: Adrenal glands are unremarkable. Kidneys are normal, without renal calculi, focal lesion, or hydronephrosis. Bladder is unremarkable. 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 Enteric contrast is present within small bowel loops and extending into the ileostomy. No evidence of bowel obstruction. Sigmoid diverticulosis. No focal bowel wall thickening or inflammatory changes are seen. Vascular/Lymphatic: Extensive atherosclerotic calcification of the abdominal aorta and its branches. No abdominopelvic lymphadenopathy. Reproductive: Uterus and bilateral adnexa are unremarkable. Other: No free fluid within the abdomen or pelvis. No pneumoperitoneum. Nodular focus within the subcutaneous soft tissues of the upper abdomen right of midline (series 2, image 11) and right gluteal region (series 2, image 30), which may reflect injection related changes. Musculoskeletal: Stable mild lumbar compression deformities. No new or acute osseous findings. IMPRESSION: 1. No acute findings in the abdomen or pelvis. 2. Extensive postsurgical changes of the bowel. No evidence of bowel obstruction. 3. Sigmoid diverticulosis without evidence of acute diverticulitis. 4. Extensive atherosclerosis. Electronically Signed   By: Davina Poke D.O.   On: 10/31/2019 16:29   DG Abd Portable 1V  Result Date: 10/16/2019 CLINICAL DATA:  Generalized abdominal  pain EXAM: PORTABLE ABDOMEN - 1 VIEW COMPARISON:  07/20/2018 FINDINGS: Stomach is distended with air. No obstructive changes are seen. No free air is noted. Diffuse aortic calcifications are noted without aneurysmal dilatation. Tubal ligation clips are seen. Degenerative changes of lumbar spine are noted. IMPRESSION: No acute abnormality seen. Electronically Signed   By: Inez Catalina M.D.   On:  10/16/2019 11:33   CT Renal Stone Study  Result Date: 10/29/2019 CLINICAL DATA:  Acute renal failure. Right-sided back pain and dizziness. Lung cancer and breast cancer. EXAM: CT ABDOMEN AND PELVIS WITHOUT CONTRAST TECHNIQUE: Multidetector CT imaging of the abdomen and pelvis was performed following the standard protocol without IV contrast. COMPARISON:  09/27/2019 renal ultrasound. Abdominopelvic CT of 04/29/2019 FINDINGS: Lower chest: Clear lung bases. Normal heart size without pericardial or pleural effusion. Multivessel coronary artery atherosclerosis. Hepatobiliary: Hepatic cysts. Normal gallbladder, without biliary ductal dilatation. Pancreas: Mild pancreatic atrophy, without duct dilatation or acute inflammation. Spleen: Normal in size, without focal abnormality. Adrenals/Urinary Tract: Normal adrenal glands. No renal calculi or hydronephrosis. No hydroureter or ureteric calculi. No bladder calculi. Stomach/Bowel: Normal stomach, without wall thickening. Moderate amount of stool within the rectum. Scattered colonic diverticula. Left abdominal mucous fistula. Right abdominal end colostomy. Normal terminal ileum. Normal small bowel caliber. Vascular/Lymphatic: Advanced aortic and branch vessel atherosclerosis. No abdominopelvic adenopathy. Reproductive: Normal uterus and adnexa. Other: No significant free fluid. Mild pelvic floor laxity. No evidence of omental or peritoneal disease. Nonspecific subcutaneous nodule about the anterior right abdominal wall at 8 mm on 14/2, new. There is also subcutaneous nodule  superficial the right gluteals of 11 mm on 29/2, new. Musculoskeletal: Mild osteopenia. L3 and L5 mild superior endplate compression deformities, similar to on the prior. This presumes a transitional S1 vertebral body. IMPRESSION: 1.  No urinary tract calculi or hydronephrosis. 2. Otherwise, low sensitivity exam secondary to stone study technique. 3. L3 and L5 mild superior endplate compression deformities, chronic. 4. Extensive surgical changes within the colon, without acute complication. 5. Coronary artery atherosclerosis. Aortic Atherosclerosis (ICD10-I70.0). 6. New subcutaneous nodularity about the abdomen and right gluteal region. Correlate with sites of injections and consider physical exam. Electronically Signed   By: Abigail Miyamoto M.D.   On: 10/29/2019 14:06    Anemia of chronic kidney failure, stage 4 (severe) [facility]) # 64 year old female patient with multiple medical problems-including esophagus stent lung cancer status post radiation; chronic kidney disease severe anemia-MGUS is currently admitted to hospital for acute renal failure.  #Severe anemia hemoglobin around 7-8-etiology is unclear however most likely suspicious of secondary chronic kidney disease.  Question myeloma related anemia/renal failure-discussed regarding a kidney biopsy.  Patient's bone marrow biopsy recently showed 5 to 9% plasma cells.  However most recent M protein was 1 g; kappa lambda light chain ratio normal-most suggestive of MGUS rather than active myeloma.  Given the clinical suspicion for MGUS rather than active myeloma-I think is reasonable to hold of the kidney biopsy.  We will proceed with erythropoietin stimulating agent.  Discussed the risk and benefits of EPO agent-[given her history of multiple malignancies/hypertension cardiovascular disease] I think the benefits outweigh the risk.  Patient agreement. Will plan proceeding with 1 unit of PRBC today.   #Acute renal failure on chronic kidney disease-unlikely myeloma  related-as patient's renal function seems to be improving with IV hydration..  Hold off kidney biopsy at this time.  Discussed with nephrology.    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  Currently s/p definitive radiation [dec 7th 2020]. Clinically stable.  We will plan PET scan for follow-up next 4 to 6 weeks  Thank you Dr.Williams for allowing me to participate in the care of your pleasant patient. Please do not hesitate to contact me with questions or concerns in the interim.Discussed with Dr. Candiss Norse.  Also discussed with Dr. Jimmye Norman. I will reach to patient's daughter  with an update.  All questions were answered. The patient knows to call the clinic with any problems, questions or concerns.    [name], MD 11/03/2019 1:16 PM

## 2019-11-03 NOTE — Assessment & Plan Note (Signed)
#  64 year old female patient with multiple medical problems-including esophagus stent lung cancer status post radiation; chronic kidney disease severe anemia-MGUS is currently admitted to hospital for acute renal failure.  #Severe anemia hemoglobin around 7-8-etiology is unclear however most likely suspicious of secondary chronic kidney disease.  Question myeloma related anemia/renal failure-discussed regarding a kidney biopsy.  Patient's bone marrow biopsy recently showed 5 to 9% plasma cells.  However most recent M protein was 1 g; kappa lambda light chain ratio normal-most suggestive of MGUS rather than active myeloma.  Given the clinical suspicion for MGUS rather than active myeloma-I think is reasonable to hold of the kidney biopsy.  We will proceed with erythropoietin stimulating agent.  Discussed the risk and benefits of EPO agent-[given her history of multiple malignancies/hypertension cardiovascular disease] I think the benefits outweigh the risk.  Patient agreement. Will plan proceeding with 1 unit of PRBC today.   #Acute renal failure on chronic kidney disease-unlikely myeloma related-as patient's renal function seems to be improving with IV hydration..  Hold off kidney biopsy at this time.  Discussed with nephrology.    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  Currently s/p definitive radiation [dec 7th 2020]. Clinically stable.  We will plan PET scan for follow-up next 4 to 6 weeks  Thank you Dr.Williams for allowing me to participate in the care of your pleasant patient. Please do not hesitate to contact me with questions or concerns in the interim.Discussed with Dr. Candiss Norse.  Also discussed with Dr. Jimmye Norman. I will reach to patient's daughter with an update.

## 2019-11-03 NOTE — Progress Notes (Signed)
[place], [place] 11/03/19  Subjective:   Hospital day # 5  Doing fair Denies any acute complaints States she is able to eat some.  No nausea or vomiting reported.  Improvement in serum creatinine is noted States that has loose stools from the ostomy improved with Imodium She has a nagging discomfort over her chest. She was able to work with physical therapist yesterday and reports that she walked around in the hallway without feeling dizzy Renal: 01/18 0701 - 01/19 0700 In: 550 [P.O.:240; I.V.:310] Out: 1260 [Urine:900; Stool:360] Lab Results  Component Value Date   CREATININE 2.35 (H) 11/03/2019   CREATININE 2.61 (H) 11/02/2019   CREATININE 3.50 (H) 11/01/2019     Objective:  Vital signs in last 24 hours:  Temp:  [98 F (36.7 C)-98.6 F (37 C)] 98.6 F (37 C) (01/19 0828) Pulse Rate:  [80-85] 84 (01/19 0828) Resp:  [16-20] 16 (01/19 0828) BP: (104-118)/(63-66) 107/64 (01/19 0828) SpO2:  [97 %-100 %] 99 % (01/19 0828) Weight:  [56.9 kg] 56.9 kg (01/19 0504)  Weight change: 2.132 kg Filed Weights   11/01/19 0357 11/02/19 0508 11/03/19 0504  Weight: 52.5 kg 54.8 kg 56.9 kg    Intake/Output:    Intake/Output Summary (Last 24 hours) at 11/03/2019 1401 Last data filed at 11/03/2019 0845 Gross per 24 hour  Intake --  Output 1300 ml  Net -1300 ml     Physical Exam: General:  Laying in the bed, no acute distress  HEENT  moist oral mucous membranes  Pulm/lungs  normal breathing effort, mild coarse breath sounds  CVS/Heart  irregular rhythm  Abdomen:   Soft, nontender, ostomy in place with liquid stool  Extremities:  No peripheral edema  Neurologic:  Alert, able to follow some commands  Skin:  No acute rashes    Basic Metabolic Panel:  Recent Labs  Lab 10/30/19 0435 10/30/19 0435 10/31/19 0448 10/31/19 0448 11/01/19 0432 11/02/19 0412 11/03/19 0610  NA 134*  --  136  --  132* 137 141  K 3.7  --  3.8  --  3.8 4.2 4.7   CL 97*  --  105  --  107 116* 119*  CO2 22  --  19*  --  17* 16* 15*  GLUCOSE 103*  --  99  --  81 83 80  BUN 76*  --  72*  --  65* 53* 43*  CREATININE 6.42*  --  5.01*  --  3.50* 2.61* 2.35*  CALCIUM 8.6*   < > 8.4*   < > 7.9* 8.0* 8.2*  PHOS  --   --  4.9*  --   --   --   --    < > = values in this interval not displayed.     CBC: Recent Labs  Lab 10/29/19 0906 10/29/19 1126 10/30/19 0435 10/31/19 0448 11/01/19 0432 11/02/19 0412 11/03/19 0610  WBC 8.8   < > 5.8 6.1 5.2 5.2 6.2  NEUTROABS 6.7  --   --   --   --   --   --   HGB 11.2*   < > 9.7* 8.9* 7.6* 7.1* 7.3*  HCT 35.2*   < > 29.3* 26.9* 23.6* 21.8* 22.9*  MCV 96.7   < > 93.3 95.1 96.7 97.3 99.1  PLT 415*   < > 352 343 291 283 287   < > = values in this interval not displayed.     No results found for: HEPBSAG,  HEPBSAB, [place]    Microbiology:  Recent Results (from the past 240 hour(s))  C difficile quick scan w PCR reflex     Status: None   Collection Time: 10/31/19 12:21 PM   Specimen: Abdomen; Stool  Result Value Ref Range Status   C Diff antigen NEGATIVE NEGATIVE Final   C Diff toxin NEGATIVE NEGATIVE Final   C Diff interpretation No C. difficile detected.  Final    Comment: Performed at [facility], [address] [place], [place] [address]    Coagulation Studies: No results for input(s): LABPROT, INR in the last 72 hours.  Urinalysis: No results for input(s): COLORURINE, LABSPEC, PHURINE, GLUCOSEU, HGBUR, BILIRUBINUR, KETONESUR, PROTEINUR, UROBILINOGEN, NITRITE, LEUKOCYTESUR in the last 72 hours.  Invalid input(s): APPERANCEUR    Imaging: DG Chest Port 1 View  Result Date: 11/03/2019 CLINICAL DATA:  Shortness of breath EXAM: PORTABLE CHEST 1 VIEW COMPARISON:  09/26/2019 FINDINGS: Cardiac shadow is within normal limits. Aortic calcifications are again seen. Diffuse prominent interstitial markings are again seen. Scarring in the right upper lobe is noted stable from the prior exam.  Mild bibasilar opacities are noted right greater than left consistent with the given clinical history. No bony abnormality is noted. IMPRESSION: Bibasilar opacities consistent with the given clinical history of COVID-19 positivity. Chronic scarring in the right upper lobe. Electronically Signed   By: Inez Catalina M.D.   On: 11/03/2019 13:23     Medications:   . sodium chloride 75 mL/hr at 11/02/19 1754   . sodium chloride  250 mL Intravenous Once  . calcium carbonate  0.5 tablet Oral Daily   And  . cholecalciferol  200 Units Oral Daily  . epoetin alfa-epbx (RETACRIT) injection  20,000 Units Subcutaneous Once  . feeding supplement (NEPRO CARB STEADY)  237 mL Oral TID BM  . ferrous sulfate  325 mg Oral BID WC  . fluticasone furoate-vilanterol  1 puff Inhalation Daily  . heparin  5,000 Units Subcutaneous Q8H  . loperamide  2 mg Oral TID  . multivitamin with minerals  1 tablet Oral Daily  . pantoprazole  40 mg Oral BID AC  . sucralfate  1 g Oral QID  . umeclidinium bromide  1 puff Inhalation Daily   acetaminophen, albuterol, butalbital-acetaminophen-caffeine, guaiFENesin-dextromethorphan, heparin lock flush, heparin lock flush, hydrOXYzine, sodium chloride flush, sodium chloride flush, traMADol  Assessment/ Plan:  65 y.o. 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  Recent Covid infection in December/January.  Loose stools off and on.  Dehydration at admission. Currently nonoliguric Serum creatinine is improving with IV  hydration Patient has underlying history of MGUS and had undergone bone marrow biopsy in fall 2020. Slightly elevated M protein and 5% plasma cells in bone marrow were noted.  Given that patient does not have hypercalcemia, and her serum creatinine has improved nicely with IV hydration and kappa to lambda ratio is normal, less likely acute myeloma kidney.  Patient does not want to undergo any invasive procedures unless she absolutely has to. With improving renal function, will defer kidney biopsy for now, continue to monitor closely and encourage hydration.  #Chronic kidney disease  stage IIIb Baseline creatinine of 1.7, GFR 36 from October 21, 2019 Underlying CKD is likely secondary to hypertension, atherosclerosis Upcoming outpatient nephrology follow-up appointment with Dr. Holley Raring November 18, 2018    #Anemia Agree with Epogen for management of anemia.  Hospitalist team is considering blood transfusion    LOS: 5 Man [place] 1/19/20212:01 PM  [place], [place]  Note: This note was prepared with Dragon dictation. Any transcription errors are unintentional

## 2019-11-04 ENCOUNTER — Other Ambulatory Visit: Payer: Medicare Other

## 2019-11-04 ENCOUNTER — Ambulatory Visit: Payer: Medicare Other | Admitting: Internal Medicine

## 2019-11-04 ENCOUNTER — Telehealth: Payer: Self-pay | Admitting: Internal Medicine

## 2019-11-04 ENCOUNTER — Ambulatory Visit: Payer: Medicaid Other | Admitting: Radiation Oncology

## 2019-11-04 DIAGNOSIS — C155 Malignant neoplasm of lower third of esophagus: Secondary | ICD-10-CM

## 2019-11-04 LAB — BPAM RBC
Blood Product Expiration Date: 202102122359
ISSUE DATE / TIME: 202101192104
Unit Type and Rh: 5100

## 2019-11-04 LAB — BASIC METABOLIC PANEL
Anion gap: 5 (ref 5–15)
BUN: 38 mg/dL — ABNORMAL HIGH (ref 8–23)
CO2: 16 mmol/L — ABNORMAL LOW (ref 22–32)
Calcium: 8.3 mg/dL — ABNORMAL LOW (ref 8.9–10.3)
Chloride: 120 mmol/L — ABNORMAL HIGH (ref 98–111)
Creatinine, Ser: 1.86 mg/dL — ABNORMAL HIGH (ref 0.44–1.00)
GFR calc Af Amer: 32 mL/min — ABNORMAL LOW (ref >60)
GFR calc non Af Amer: 28 mL/min — ABNORMAL LOW (ref >60)
Glucose, Bld: 83 mg/dL (ref 70–99)
Potassium: 4.7 mmol/L (ref 3.5–5.1)
Sodium: 141 mmol/L (ref 135–145)

## 2019-11-04 LAB — TYPE AND SCREEN
ABO/RH(D): O POS
Antibody Screen: NEGATIVE
Unit division: 0

## 2019-11-04 LAB — NOROVIRUS GROUP 1 & 2 BY PCR, STOOL
Norovirus 1 by PCR: NEGATIVE
Norovirus 2  by PCR: NEGATIVE

## 2019-11-04 LAB — CBC
HCT: 28 % — ABNORMAL LOW (ref 36.0–46.0)
Hemoglobin: 8.9 g/dL — ABNORMAL LOW (ref 12.0–15.0)
MCH: 30.1 pg (ref 26.0–34.0)
MCHC: 31.8 g/dL (ref 30.0–36.0)
MCV: 94.6 fL (ref 80.0–100.0)
Platelets: 286 10*3/uL (ref 150–400)
RBC: 2.96 MIL/uL — ABNORMAL LOW (ref 3.87–5.11)
RDW: 18.7 % — ABNORMAL HIGH (ref 11.5–15.5)
WBC: 5.1 10*3/uL (ref 4.0–10.5)
nRBC: 0.4 % — ABNORMAL HIGH (ref 0.0–0.2)

## 2019-11-04 MED ORDER — CALCIUM CARBONATE ANTACID 500 MG PO CHEW: 0.5000 | CHEWABLE_TABLET | Freq: Every day | ORAL | Status: DC

## 2019-11-04 MED ORDER — ACETAMINOPHEN 500 MG PO TABS
1000.0000 mg | ORAL_TABLET | Freq: Once | ORAL | Status: AC
  Administered 2019-11-04: 13:00:00 1000 mg via ORAL
  Filled 2019-11-04: qty 2

## 2019-11-04 MED ORDER — SODIUM BICARBONATE 650 MG PO TABS
1300.0000 mg | ORAL_TABLET | Freq: Two times a day (BID) | ORAL | Status: DC
  Administered 2019-11-04: 09:00:00 1300 mg via ORAL
  Filled 2019-11-04: qty 2

## 2019-11-04 MED ORDER — FUROSEMIDE 20 MG PO TABS: ORAL_TABLET | ORAL | 5 refills | Status: DC

## 2019-11-04 NOTE — Telephone Encounter (Signed)
Per Dr. B Patient needs apt for - Lab/ md Cbc, hold tube, MetB and Retacrit in 2 weeks

## 2019-11-04 NOTE — Progress Notes (Addendum)
Pt refusing wound care.   Update 1233: patient ambulated in the room x4 on room air. Oxygen saturation stayed above 92%.

## 2019-11-04 NOTE — Addendum Note (Signed)
Addended by: Sabino Gasser on: 11/04/2019 03:14 PM   Modules accepted: Orders

## 2019-11-04 NOTE — Telephone Encounter (Signed)
Family dropped off fmla papers yesterday. Family was informed that fmla takes 7-10 days to process.

## 2019-11-04 NOTE — Plan of Care (Signed)
  Problem: Education: Goal: Knowledge of ostomy care will improve Outcome: Adequate for Discharge Goal: Understanding of discharge needs will improve Outcome: Adequate for Discharge   Problem: Bowel/Gastric/Urinary: Goal: Gastrointestinal status for postoperative course will improve Outcome: Adequate for Discharge   Problem: Coping: Goal: Coping ability will improve Outcome: Adequate for Discharge   Problem: Education: Goal: Knowledge of General Education information will improve Description: Including pain rating scale, medication(s)/side effects and non-pharmacologic comfort measures Outcome: Adequate for Discharge   Problem: Health Behavior/Discharge Planning: Goal: Ability to manage health-related needs will improve Outcome: Adequate for Discharge   Problem: Clinical Measurements: Goal: Ability to maintain clinical measurements within normal limits will improve Outcome: Adequate for Discharge Goal: Will remain free from infection Outcome: Adequate for Discharge Goal: Diagnostic test results will improve Outcome: Adequate for Discharge Goal: Respiratory complications will improve Outcome: Adequate for Discharge Goal: Cardiovascular complication will be avoided Outcome: Adequate for Discharge   Problem: Activity: Goal: Risk for activity intolerance will decrease Outcome: Adequate for Discharge   Problem: Nutrition: Goal: Adequate nutrition will be maintained Outcome: Adequate for Discharge   Problem: Coping: Goal: Level of anxiety will decrease Outcome: Adequate for Discharge   Problem: Elimination: Goal: Will not experience complications related to bowel motility Outcome: Adequate for Discharge Goal: Will not experience complications related to urinary retention Outcome: Adequate for Discharge   Problem: Pain Managment: Goal: General experience of comfort will improve Outcome: Adequate for Discharge   Problem: Safety: Goal: Ability to remain free from  injury will improve Outcome: Adequate for Discharge   Problem: Skin Integrity: Goal: Risk for impaired skin integrity will decrease Outcome: Adequate for Discharge

## 2019-11-04 NOTE — Progress Notes (Signed)
[place], [place] 11/04/19  Subjective:   Hospital day # 6  Doing fair States she is able to eat some.  No nausea or vomiting reported.   Improvement in serum creatinine is noted States that has loose stools from the ostomy improved with Imodium She continues to have a nagging discomfort over her chest.   Renal: 01/19 0701 - 01/20 0700 In: 1623.3 [P.O.:240; I.V.:1000; Blood:383.3] Out: 1650 [Urine:900; Stool:750] Lab Results  Component Value Date   CREATININE 1.86 (H) 11/04/2019   CREATININE 2.35 (H) 11/03/2019   CREATININE 2.61 (H) 11/02/2019     Objective:  Vital signs in last 24 hours:  Temp:  [97.5 F (36.4 C)-98.7 F (37.1 C)] 98.4 F (36.9 C) (01/20 0804) Pulse Rate:  [77-90] 83 (01/20 0804) Resp:  [16-18] 16 (01/20 0804) BP: (84-118)/(50-84) 118/84 (01/20 0804) SpO2:  [98 %-100 %] 98 % (01/20 0804) Weight:  [58.4 kg] 58.4 kg (01/20 0410)  Weight change: 1.497 kg Filed Weights   11/02/19 0508 11/03/19 0504 11/04/19 0410  Weight: 54.8 kg 56.9 kg 58.4 kg    Intake/Output:    Intake/Output Summary (Last 24 hours) at 11/04/2019 0906 Last data filed at 11/04/2019 6808 Gross per 24 hour  Intake 1804.58 ml  Output 1650 ml  Net 154.58 ml     Physical Exam: General:  Laying in the bed, no acute distress  HEENT  moist oral mucous membranes  Pulm/lungs  normal breathing effort, mild coarse breath sounds  CVS/Heart  irregular rhythm  Abdomen:   Soft, nontender, ostomy in place with liquid stool  Extremities:  No peripheral edema  Neurologic:  Alert, able to follow some commands  Skin:  No acute rashes    Basic Metabolic Panel:  Recent Labs  Lab 10/31/19 0448 10/31/19 0448 11/01/19 0432 11/01/19 0432 11/02/19 0412 11/03/19 0610 11/04/19 0432  NA 136  --  132*  --  137 141 141  K 3.8  --  3.8  --  4.2 4.7 4.7  CL 105  --  107  --  116* 119* 120*  CO2 19*  --  17*  --  16* 15* 16*  GLUCOSE 99  --  81  --  83 80 83   BUN 72*  --  65*  --  53* 43* 38*  CREATININE 5.01*  --  3.50*  --  2.61* 2.35* 1.86*  CALCIUM 8.4*   < > 7.9*   < > 8.0* 8.2* 8.3*  PHOS 4.9*  --   --   --   --   --   --    < > = values in this interval not displayed.     CBC: Recent Labs  Lab 10/29/19 0906 10/29/19 1126 10/31/19 0448 11/01/19 0432 11/02/19 0412 11/03/19 0610 11/04/19 0432  WBC 8.8   < > 6.1 5.2 5.2 6.2 5.1  NEUTROABS 6.7  --   --   --   --   --   --   HGB 11.2*   < > 8.9* 7.6* 7.1* 7.3* 8.9*  HCT 35.2*   < > 26.9* 23.6* 21.8* 22.9* 28.0*  MCV 96.7   < > 95.1 96.7 97.3 99.1 94.6  PLT 415*   < > 343 291 283 287 286   < > = values in this interval not displayed.     No results found for: HEPBSAG, HEPBSAB, HEPBIGM    Microbiology:  Recent Results (from the past 240 hour(s))  C difficile quick  scan w PCR reflex     Status: None   Collection Time: 10/31/19 12:21 PM   Specimen: Abdomen; Stool  Result Value Ref Range Status   C Diff antigen NEGATIVE NEGATIVE Final   C Diff toxin NEGATIVE NEGATIVE Final   C Diff interpretation No C. difficile detected.  Final    Comment: Performed at [facility], [address] [place], [place] [address]    Coagulation Studies: No results for input(s): LABPROT, INR in the last 72 hours.  Urinalysis: No results for input(s): COLORURINE, LABSPEC, PHURINE, GLUCOSEU, HGBUR, BILIRUBINUR, KETONESUR, PROTEINUR, UROBILINOGEN, NITRITE, LEUKOCYTESUR in the last 72 hours.  Invalid input(s): APPERANCEUR    Imaging: DG Chest Port 1 View  Result Date: 11/03/2019 CLINICAL DATA:  Shortness of breath EXAM: PORTABLE CHEST 1 VIEW COMPARISON:  09/26/2019 FINDINGS: Cardiac shadow is within normal limits. Aortic calcifications are again seen. Diffuse prominent interstitial markings are again seen. Scarring in the right upper lobe is noted stable from the prior exam. Mild bibasilar opacities are noted right greater than left consistent with the given clinical history. No bony  abnormality is noted. IMPRESSION: Bibasilar opacities consistent with the given clinical history of COVID-19 positivity. Chronic scarring in the right upper lobe. Electronically Signed   By: Inez Catalina M.D.   On: 11/03/2019 13:23     Medications:    . calcium carbonate  0.5 tablet Oral Daily   And  . cholecalciferol  200 Units Oral Daily  . feeding supplement (NEPRO CARB STEADY)  237 mL Oral TID BM  . ferrous sulfate  325 mg Oral BID WC  . fluticasone furoate-vilanterol  1 puff Inhalation Daily  . heparin  5,000 Units Subcutaneous Q8H  . loperamide  2 mg Oral TID  . multivitamin with minerals  1 tablet Oral Daily  . pantoprazole  40 mg Oral BID AC  . sodium bicarbonate  1,300 mg Oral BID  . sucralfate  1 g Oral QID  . umeclidinium bromide  1 puff Inhalation Daily   acetaminophen, albuterol, butalbital-acetaminophen-caffeine, guaiFENesin-dextromethorphan, heparin lock flush, heparin lock flush, hydrOXYzine, sodium chloride flush, sodium chloride flush, traMADol  Assessment/ Plan:  64 y.o. 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  Recent Covid infection in December/January.  Loose stools off and on.  Dehydration at admission. Currently nonoliguric Serum creatinine is improving with IV hydration Patient has underlying history of MGUS and had undergone bone marrow biopsy in fall 2020. Slightly elevated M protein and 5% plasma cells in bone marrow were noted.  Given that patient does not have hypercalcemia, and her serum  creatinine has improved nicely with IV hydration and kappa to lambda ratio is normal, less likely acute myeloma kidney.  Patient does not want to undergo any invasive procedures unless she absolutely has to. With improving renal function, will defer kidney biopsy for now, continue to monitor closely and encourage hydration. Follow up outpatient   #Chronic kidney disease stage IIIb Lab Results  Component Value Date   CREATININE 1.86 (H) 11/04/2019   Baseline creatinine of 1.7, GFR 36 from October 21, 2019 Underlying CKD is likely secondary to hypertension, atherosclerosis  11/19/2019 Office Visit Nephrology Anthonette Legato, MD      [place] Blood transfusion given yesterday F/u outpatient with hem/onc- Dr Rogue Bussing    LOS: [place] 1/20/20219:06 [place], [place] [facility]  Note: This note was prepared with Dragon dictation. Any transcription errors are unintentional

## 2019-11-04 NOTE — Discharge Summary (Addendum)
Physician Discharge Summary   Danielle [name]  female DOB: [birth year]  NWG:[id number]  PCP: [facility] [place] date: 10/29/2019 Discharge date: 11/04/2019  Admitted From: home Disposition:  home CODE STATUS: Full code  Discharge Instructions    Care order/instruction   Complete by: As directed    Transfuse Parameters   Complete patient signature process for consent form   Complete by: As directed    Diet - low sodium heart healthy   Complete by: As directed    Discharge instructions   Complete by: As directed    Because your kidney suffered some injury recently, please hold your Lasix until follow up with kidney doctor.  Kidney doctor's contact info included.   Dr. Enzo Bi - -   Increase activity slowly   Complete by: As directed    Practitioner attestation of consent   Complete by: As directed    I, the ordering practitioner, attest that I have discussed with the patient the benefits, risks, side effects, alternatives, likelihood of achieving goals and potential problems during recovery for the procedure listed.   Procedure: Blood Product(s)   Type and screen   Complete by: As directed    [place] [facility] Course:  For full details, please see H&P, progress notes, consult notes and ancillary notes.  Briefly,  Danielle [name] is a 64 y.o. female with medical history significant of hypertension, COPD, GERD, depression with anxiety, breast cancer (mastectomy, chemo and radiation therapy), lung cancer (radiation therapy), CHF with EF 35-40%, iron deficiency anemia, CKD-3b, ileostomy, positive covid 19 Ag on 12/13 and positive covid 19 PCR on 12/30,  who presents with abdominal cramps and right sided back pain.  AKI on CKD4 pt was found to have worsening kidney function with creatinine up from 1.7 on 10/21/2019 to 8.44, BUN 75.  CT per renal stone protocol is negative for obstruction or hydronephrosis.  Nephrology  consulted, deemed AKI likely secondary to prerenal/ATN from concurrent infection.  Recent Covid infection in December/January.  Loose stools off and on.  Dehydration at admission. Currently nonoliguric.  Serum creatinine improved with IV hydration.  On the day of discharge, Cr 1.86, about back to baseline.  Will follow up with nephrology Dr. Holley Raring as outpatient.  Diarrhea:  etiology unclear. GI panel neg. C. diff neg. CT abd/pelvis shows no acute intraabdominal findings.    Normocytic anemia etiology unclear as per GI on last admission.  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 Follows with Dr. Windell Moment. Received 1 unit of PRBCs.  Epo as per heme/nephro.  F/u outpatient with hem/onc- Dr Rogue Bussing.  Severe malnourishment/protein calorie: distal esophageal cancer  she follows with [facility]/Dr. Rogue Bussing. Continue w/ nutritional supplements   Moderate COPD: w/o exacerbation. Continue bronchodilators.  Hx of COVID19 pneumonia:  received remdesivir, steroid course on previous admission in 2020. Still positive on 10/14/19. No repeat COVID19 test was done this admission.  Depression: severity unknown.  home citalopram held secondary to AKI, resumed on discharge.  Chronic systolic heart failure : 2D echo on 04/21/2018 showed EF 35-40%. Euvolemic. Hold Lasix due to worsening renal function  HTN: Home lasix and coreg held secondary to low normal BP during hospitalization.  Coreg resumed after discharge.  GERD:  continued PPI   Presence of ostomy:  was due to colon ulceration in2019 at the hepatic flexure, causing bleeding, and not due to ulcerative colitis as per GI on last admission. NOT ulcerative  colitis.   Generalized weakness:  PT   Discharge Diagnoses:  Principal Problem:   Acute renal failure superimposed on stage 3b chronic kidney disease ([place]) Active Problems:   Moderate COPD (chronic obstructive pulmonary disease) (HCC)   Severe protein-calorie malnutrition  (HCC)   Chronic systolic heart failure (HCC)   HTN (hypertension)   Iron deficiency anemia secondary to blood loss (chronic)   Anemia of chronic kidney failure, stage 4 (severe) (HCC)   GERD (gastroesophageal reflux disease)   Depression   Diarrhea    Discharge Instructions:  Allergies as of 11/04/2019   No Known Allergies     Medication List    TAKE these medications   acetaminophen 325 MG tablet Commonly known as: TYLENOL Take 2 tablets (650 mg total) by mouth every 6 (six) hours as needed for mild pain (or Fever >/= 101).   albuterol 108 (90 Base) MCG/ACT inhaler Commonly known as: VENTOLIN HFA Inhale 2 puffs into the lungs every 6 (six) hours as needed for wheezing or shortness of breath.   calcium carbonate 500 MG chewable tablet Commonly known as: TUMS - dosed in mg elemental calcium Chew 0.5 tablets (100 mg of elemental calcium total) by mouth daily.   calcium-vitamin D 250-125 MG-UNIT tablet Commonly known as: OSCAL WITH D Take 1 tablet by mouth daily.   carvedilol 3.125 MG tablet Commonly known as: COREG Take 3.125 mg by mouth 2 (two) times daily with a meal.   citalopram 20 MG tablet Commonly known as: CELEXA Take 40 mg by mouth daily.   feeding supplement (NEPRO CARB STEADY) Liqd Take 237 mLs by mouth 3 (three) times daily between meals.   ferrous sulfate 325 (65 FE) MG tablet Take 1 tablet (325 mg total) by mouth 2 (two) times daily with a meal.   Fluticasone-Umeclidin-Vilant 100-62.5-25 MCG/INH Aepb Inhale 1 puff into the lungs daily.   furosemide 20 MG tablet Commonly known as: LASIX Hold until follow up with kidney doctor. What changed:   how much to take  how to take this  when to take this  additional instructions   guaiFENesin-dextromethorphan 100-10 MG/5ML syrup Commonly known as: ROBITUSSIN DM Take 5 mLs by mouth every 4 (four) hours as needed for cough.   loperamide 2 MG tablet Commonly known as: Imodium A-D Take 1 tablet (2  mg total) by mouth as needed for diarrhea or loose stools.   multivitamin with minerals Tabs tablet Take 1 tablet by mouth daily.   pantoprazole 40 MG tablet Commonly known as: Protonix Take 1 tablet (40 mg total) by mouth 2 (two) times daily before a meal.   sodium bicarbonate 650 MG tablet Take 2 tablets (1,300 mg total) by mouth 3 (three) times daily.   sucralfate 1 GM/10ML suspension Commonly known as: CARAFATE Take 10 mLs (1 g total) by mouth 4 (four) times daily.       Follow-up Information    Murlean Iba, MD. Schedule an appointment as soon as possible for a visit in 1 week(s).   Specialty: Nephrology Contact information: [place] [address] [place], [facility]. Schedule an appointment as soon as possible for a visit in 1 week.   Specialty: General Practice Contact information: [place] [id number] [phone]           No Known Allergies   The results of significant diagnostics from this hospitalization (including imaging, microbiology, ancillary and laboratory) are  listed below for reference.   Consultations:   Procedures/Studies: CT ABDOMEN PELVIS WO CONTRAST  Result Date: 10/31/2019 CLINICAL DATA:  Abdominal cramping EXAM: CT ABDOMEN AND PELVIS WITHOUT CONTRAST TECHNIQUE: Multidetector CT imaging of the abdomen and pelvis was performed following the standard protocol without IV contrast. COMPARISON:  10/29/2019 FINDINGS: Lower chest: Lung bases are clear. Heart size is normal. Coronary artery calcifications evident. Hepatobiliary: Stable size and appearance of small low-density lesions within the liver, which may reflect cysts versus hemangiomas. Gallbladder appears unremarkable. No gallstone. No biliary dilatation. Pancreas: Unremarkable. No pancreatic ductal dilatation or surrounding inflammatory changes. Spleen: Normal in size without focal abnormality.  Adrenals/Urinary Tract: Adrenal glands are unremarkable. Kidneys are normal, without renal calculi, focal lesion, or hydronephrosis. Bladder is unremarkable. 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 Enteric contrast is present within small bowel loops and extending into the ileostomy. No evidence of bowel obstruction. Sigmoid diverticulosis. No focal bowel wall thickening or inflammatory changes are seen. Vascular/Lymphatic: Extensive atherosclerotic calcification of the abdominal aorta and its branches. No abdominopelvic lymphadenopathy. Reproductive: Uterus and bilateral adnexa are unremarkable. Other: No free fluid within the abdomen or pelvis. No pneumoperitoneum. Nodular focus within the subcutaneous soft tissues of the upper abdomen right of midline (series 2, image 11) and right gluteal region (series 2, image 30), which may reflect injection related changes. Musculoskeletal: Stable mild lumbar compression deformities. No new or acute osseous findings. IMPRESSION: 1. No acute findings in the abdomen or pelvis. 2. Extensive postsurgical changes of the bowel. No evidence of bowel obstruction. 3. Sigmoid diverticulosis without evidence of acute diverticulitis. 4. Extensive atherosclerosis. Electronically Signed   By: Davina Poke D.O.   On: 10/31/2019 16:29   DG Chest 2 View  Result Date: 11/06/2019 CLINICAL DATA:  Chest pain. Shortness of breath. COVID-19 positive. EXAM: CHEST - 2 VIEW COMPARISON:  Chest x-ray 11/03/2019, 06/19/2019.  CT 11/19/2018. FINDINGS: Mediastinum and hilar structures normal. Stable changes of scarring right upper lung. Bibasilar interstitial prominence with nodularity. These changes may be related pneumonitis. Follow-up exam to demonstrate clearing suggested. New small bilateral pleural effusions, right side greater than left. No pneumothorax. IMPRESSION: 1. Bibasilar interstitial prominence with  nodularity. These changes may be related to pneumonitis. Follow-up exams suggested to demonstrate resolution. 2. New small bilateral pleural effusions, right side greater than left. 3.   Persistent changes of scarring right upper lung. Electronically Signed   By: Marcello Moores  Register   On: 11/06/2019 13:04   CT Angio Chest PE W and/or Wo Contrast  Result Date: 11/06/2019 CLINICAL DATA:  Shortness of breath, recent COVID EXAM: CT ANGIOGRAPHY CHEST WITH CONTRAST TECHNIQUE: Multidetector CT imaging of the chest was performed using the standard protocol during bolus administration of intravenous contrast. Multiplanar CT image reconstructions and MIPs were obtained to evaluate the vascular anatomy. CONTRAST:  48mL OMNIPAQUE IOHEXOL 350 MG/ML SOLN COMPARISON:  11/19/2018 FINDINGS: Cardiovascular: Satisfactory opacification of the pulmonary arteries to the segmental level. No evidence of pulmonary embolism. Normal heart size. No pericardial effusion. Coronary artery calcification. Calcified plaque is present along the thoracic aorta. Mediastinum/Nodes: There is no mediastinal, hilar, or axillary adenopathy. Lungs/Pleura: Small to moderate right pleural effusion. Greater confluent appearance of anterior right upper lobe opacity. Similar appearance of contiguous more superior right upper lobe opacity. Mild bibasilar atelectasis. Moderate emphysema. Upper Abdomen: No acute abnormality. Musculoskeletal: No acute abnormality. Review of the MIP images confirms the above findings. IMPRESSION: No acute pulmonary embolism. Small to  moderate right pleural effusion. More confluent appearance of anterior right upper lobe treated mass. Emphysema and aortic atherosclerosis. Electronically Signed   By: Macy Mis M.D.   On: 11/06/2019 15:50   DG Chest Port 1 View  Result Date: 11/03/2019 CLINICAL DATA:  Shortness of breath EXAM: PORTABLE CHEST 1 VIEW COMPARISON:  09/26/2019 FINDINGS: Cardiac shadow is within normal limits. Aortic  calcifications are again seen. Diffuse prominent interstitial markings are again seen. Scarring in the right upper lobe is noted stable from the prior exam. Mild bibasilar opacities are noted right greater than left consistent with the given clinical history. No bony abnormality is noted. IMPRESSION: Bibasilar opacities consistent with the given clinical history of COVID-19 positivity. Chronic scarring in the right upper lobe. Electronically Signed   By: Inez Catalina M.D.   On: 11/03/2019 13:23   DG Abd Portable 1V  Result Date: 10/16/2019 CLINICAL DATA:  Generalized abdominal pain EXAM: PORTABLE ABDOMEN - 1 VIEW COMPARISON:  07/20/2018 FINDINGS: Stomach is distended with air. No obstructive changes are seen. No free air is noted. Diffuse aortic calcifications are noted without aneurysmal dilatation. Tubal ligation clips are seen. Degenerative changes of lumbar spine are noted. IMPRESSION: No acute abnormality seen. Electronically Signed   By: Inez Catalina M.D.   On: 10/16/2019 11:33   CT Renal Stone Study  Result Date: 10/29/2019 CLINICAL DATA:  Acute renal failure. Right-sided back pain and dizziness. Lung cancer and breast cancer. EXAM: CT ABDOMEN AND PELVIS WITHOUT CONTRAST TECHNIQUE: Multidetector CT imaging of the abdomen and pelvis was performed following the standard protocol without IV contrast. COMPARISON:  09/27/2019 renal ultrasound. Abdominopelvic CT of 04/29/2019 FINDINGS: Lower chest: Clear lung bases. Normal heart size without pericardial or pleural effusion. Multivessel coronary artery atherosclerosis. Hepatobiliary: Hepatic cysts. Normal gallbladder, without biliary ductal dilatation. Pancreas: Mild pancreatic atrophy, without duct dilatation or acute inflammation. Spleen: Normal in size, without focal abnormality. Adrenals/Urinary Tract: Normal adrenal glands. No renal calculi or hydronephrosis. No hydroureter or ureteric calculi. No bladder calculi. Stomach/Bowel: Normal stomach, without  wall thickening. Moderate amount of stool within the rectum. Scattered colonic diverticula. Left abdominal mucous fistula. Right abdominal end colostomy. Normal terminal ileum. Normal small bowel caliber. Vascular/Lymphatic: Advanced aortic and branch vessel atherosclerosis. No abdominopelvic adenopathy. Reproductive: Normal uterus and adnexa. Other: No significant free fluid. Mild pelvic floor laxity. No evidence of omental or peritoneal disease. Nonspecific subcutaneous nodule about the anterior right abdominal wall at 8 mm on 14/2, new. There is also subcutaneous nodule superficial the right gluteals of 11 mm on 29/2, new. Musculoskeletal: Mild osteopenia. L3 and L5 mild superior endplate compression deformities, similar to on the prior. This presumes a transitional S1 vertebral body. IMPRESSION: 1.  No urinary tract calculi or hydronephrosis. 2. Otherwise, low sensitivity exam secondary to stone study technique. 3. L3 and L5 mild superior endplate compression deformities, chronic. 4. Extensive surgical changes within the colon, without acute complication. 5. Coronary artery atherosclerosis. Aortic Atherosclerosis (ICD10-I70.0). 6. New subcutaneous nodularity about the abdomen and right gluteal region. Correlate with sites of injections and consider physical exam. Electronically Signed   By: Abigail Miyamoto M.D.   On: 10/29/2019 14:06      Labs: BNP (last 3 results) Recent Labs    12/22/18 0347 10/29/19 1923 11/06/19 1405  BNP 80.0 85.0 2,353.6*   Basic Metabolic Panel: Recent Labs  Lab 10/31/19 0448 10/31/19 0448 11/01/19 0432 11/02/19 0412 11/03/19 0610 11/04/19 0432 11/06/19 1205  NA 136   < > 132* 137 141  141 142  K 3.8   < > 3.8 4.2 4.7 4.7 4.6  CL 105   < > 107 116* 119* 120* 116*  CO2 19*   < > 17* 16* 15* 16* 18*  GLUCOSE 99   < > 81 83 80 83 84  BUN 72*   < > 65* 53* 43* 38* 24*  CREATININE 5.01*   < > 3.50* 2.61* 2.35* 1.86* 1.84*  CALCIUM 8.4*   < > 7.9* 8.0* 8.2* 8.3* 9.1   PHOS 4.9*  --   --   --   --   --   --    < > = values in this interval not displayed.   Liver Function Tests: No results for input(s): AST, ALT, ALKPHOS, BILITOT, PROT, ALBUMIN in the last 168 hours. No results for input(s): LIPASE, AMYLASE in the last 168 hours. No results for input(s): AMMONIA in the last 168 hours. CBC: Recent Labs  Lab 11/01/19 0432 11/02/19 0412 11/03/19 0610 11/04/19 0432 11/06/19 1205  WBC 5.2 5.2 6.2 5.1 5.8  HGB 7.6* 7.1* 7.3* 8.9* 9.8*  HCT 23.6* 21.8* 22.9* 28.0* 31.1*  MCV 96.7 97.3 99.1 94.6 94.0  PLT 291 283 287 286 302   Cardiac Enzymes: No results for input(s): CKTOTAL, CKMB, CKMBINDEX, TROPONINI in the last 168 hours. BNP: Invalid input(s): POCBNP CBG: No results for input(s): GLUCAP in the last 168 hours. D-Dimer No results for input(s): DDIMER in the last 72 hours. Hgb A1c No results for input(s): HGBA1C in the last 72 hours. Lipid Profile No results for input(s): CHOL, HDL, LDLCALC, TRIG, CHOLHDL, LDLDIRECT in the last 72 hours. Thyroid function studies No results for input(s): TSH, T4TOTAL, T3FREE, THYROIDAB in the last 72 hours.  Invalid input(s): FREET3 Anemia work up No results for input(s): VITAMINB12, FOLATE, FERRITIN, TIBC, IRON, RETICCTPCT in the last 72 hours. Urinalysis    Component Value Date/Time   COLORURINE YELLOW (A) 10/29/2019 1929   APPEARANCEUR HAZY (A) 10/29/2019 1929   APPEARANCEUR Clear 12/06/2013 2245   LABSPEC 1.020 10/29/2019 1929   LABSPEC 1.015 12/06/2013 2245   PHURINE 5.0 10/29/2019 1929   GLUCOSEU NEGATIVE 10/29/2019 1929   GLUCOSEU Negative 12/06/2013 2245   HGBUR NEGATIVE 10/29/2019 [place] NEGATIVE 10/29/2019 1929   BILIRUBINUR Negative 12/06/2013 2245   KETONESUR NEGATIVE 10/29/2019 [place] NEGATIVE 10/29/2019 1929   NITRITE NEGATIVE 10/29/2019 1929   LEUKOCYTESUR SMALL (A) 10/29/2019 1929   LEUKOCYTESUR Negative 12/06/2013 2245   Sepsis Labs Invalid input(s):  PROCALCITONIN,  WBC,  LACTICIDVEN Microbiology Recent Results (from the past 240 hour(s))  C difficile quick scan w PCR reflex     Status: None   Collection Time: 10/31/19 12:21 PM   Specimen: Abdomen; Stool  Result Value Ref Range Status   C Diff antigen NEGATIVE NEGATIVE Final   C Diff toxin NEGATIVE NEGATIVE Final   C Diff interpretation No C. difficile detected.  Final    Comment: Performed at [facility], [address] [place], [place] [address]     Total time spend on discharging this patient, including the last patient exam, discussing the hospital stay, instructions for ongoing care as it relates to all pertinent caregivers, as well as preparing the medical discharge records, prescriptions, and/or referrals as applicable, is 40 minutes.    Enzo Bi, MD  [facility] 11/06/2019, 6:26 PM  If 7PM-7AM, please contact night-coverage

## 2019-11-04 NOTE — Telephone Encounter (Signed)
On 1/19-spoke to patient's daughter regarding worsening anemia/renal failure.  Given improvement of renal failure-hold off kidney biopsy.  We will plan to proceed with erythropoietin stimulating agent.  Understand that patient will need outpatient follow-up/injections.  Also discussed regarding PRBC transfusion.  Defer to primary service for discharge planning.   Heather/Tamika-patient's daughter requesting FMLA paperwork as soon as possible/hopefully end of this week.  Thank you!

## 2019-11-04 NOTE — Care Management Important Message (Signed)
Important Message  Patient Details  Name: ARRIEL VICTOR MRN: [id number] Date of Birth: 12-28-1955   Medicare Important Message Given:  Yes  Reviewed verbally over room phone with patient due to isolation.  Declined copy of Medicare IM as one previously mailed to home address on file.     Dannette Walda 11/04/2019, 12:00 PM

## 2019-11-04 NOTE — Progress Notes (Signed)
Patient given discharge instructions. Verbalized understanding. Family picking up patient. IV's taken out.

## 2019-11-05 ENCOUNTER — Encounter: Payer: Self-pay | Admitting: *Deleted

## 2019-11-05 IMAGING — CT NM PET TUM IMG RESTAG (PS) SKULL BASE T - THIGH
1 of 9 series · 1 of 25 positions shown · non-contrast
Comparison: None.

CLINICAL DATA: Subsequent treatment strategy for RIGHT lung cancer.
Remote history of breast cancer.

EXAM:
NUCLEAR MEDICINE PET SKULL BASE TO THIGH
TECHNIQUE: 7.1 mCi F-18 FDG was injected intravenously. Full-ring PET imaging
was performed from the skull base to thigh after the radiotracer. CT
data was obtained and used for attenuation correction and anatomic
localization.
Fasting blood glucose: 64 mg/dl

[Series 3: ct wb 5.0 b30f · axial · 5.0mm · 0.98mm/px · 1 of 251 slices shown]
[im 251/251  brain]
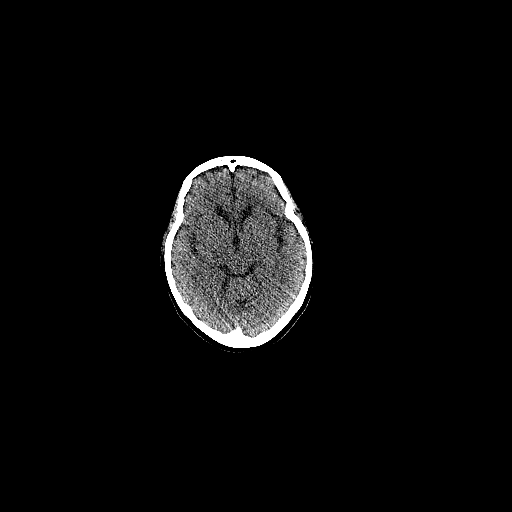

[1 of 25 positions shown; findings below may reference images not displayed]

FINDINGS: Mediastinal blood pool activity: SUV max

NECK: No hypermetabolic lymph nodes in the neck.

Incidental CT findings: none

CHEST: Flattened perihilar consolidation in the RIGHT upper lobe at
treatment site is similar comparison CT and has mild metabolic
activity most consists with post radiation treatment inflammation.
No hypermetabolic nodularity.

No hypermetabolic mediastinal lymph nodes. Physiologic activity
noted in the RIGHT atrial appendage. No hypermetabolic
supraclavicular adenopathy.

Incidental CT findings: None

ABDOMEN/PELVIS: No abnormal hypermetabolic activity within the
liver, pancreas, adrenal glands, or spleen. No hypermetabolic lymph
nodes in the abdomen or pelvis.

Metabolic activity associated with 2 abdominal ostomy sites which is
favored physiologic.

Incidental CT findings: none

SKELETON: No focal hypermetabolic activity to suggest skeletal
metastasis.

Incidental CT findings: none
IMPRESSION: 1. Mild metabolic activity associated with RIGHT perihilar fibrotic
consolidation is favored benign post radiation inflammation. No
evidence of lung cancer recurrence.
2. No evidence distant metastatic disease.

## 2019-11-06 ENCOUNTER — Inpatient Hospital Stay
Admission: EM | Admit: 2019-11-06 | Discharge: 2019-11-11 | DRG: 291 | Disposition: A | Discharge status: Home / Self Care | Payer: Medicare Other | Attending: Internal Medicine | Admitting: Internal Medicine

## 2019-11-06 ENCOUNTER — Telehealth: Payer: Self-pay | Admitting: *Deleted

## 2019-11-06 ENCOUNTER — Encounter: Payer: Self-pay | Admitting: Intensive Care

## 2019-11-06 ENCOUNTER — Emergency Department: Payer: Medicare Other

## 2019-11-06 ENCOUNTER — Other Ambulatory Visit: Payer: Self-pay

## 2019-11-06 ENCOUNTER — Encounter: Payer: Self-pay | Admitting: Internal Medicine

## 2019-11-06 DIAGNOSIS — I5022 Chronic systolic (congestive) heart failure: Secondary | ICD-10-CM

## 2019-11-06 DIAGNOSIS — I509 Heart failure, unspecified: Secondary | ICD-10-CM

## 2019-11-06 DIAGNOSIS — E43 Unspecified severe protein-calorie malnutrition: Secondary | ICD-10-CM | POA: Diagnosis present

## 2019-11-06 DIAGNOSIS — Z681 Body mass index (BMI) 19 or less, adult: Secondary | ICD-10-CM | POA: Diagnosis not present

## 2019-11-06 DIAGNOSIS — Z803 Family history of malignant neoplasm of breast: Secondary | ICD-10-CM

## 2019-11-06 DIAGNOSIS — I5043 Acute on chronic combined systolic (congestive) and diastolic (congestive) heart failure: Secondary | ICD-10-CM | POA: Diagnosis present

## 2019-11-06 DIAGNOSIS — Z932 Ileostomy status: Secondary | ICD-10-CM

## 2019-11-06 DIAGNOSIS — F329 Major depressive disorder, single episode, unspecified: Secondary | ICD-10-CM | POA: Diagnosis present

## 2019-11-06 DIAGNOSIS — N1832 Chronic kidney disease, stage 3b: Secondary | ICD-10-CM | POA: Diagnosis present

## 2019-11-06 DIAGNOSIS — Z79899 Other long term (current) drug therapy: Secondary | ICD-10-CM

## 2019-11-06 DIAGNOSIS — Z8616 Personal history of COVID-19: Secondary | ICD-10-CM

## 2019-11-06 DIAGNOSIS — D500 Iron deficiency anemia secondary to blood loss (chronic): Secondary | ICD-10-CM | POA: Diagnosis present

## 2019-11-06 DIAGNOSIS — Z923 Personal history of irradiation: Secondary | ICD-10-CM

## 2019-11-06 DIAGNOSIS — Z85118 Personal history of other malignant neoplasm of bronchus and lung: Secondary | ICD-10-CM | POA: Diagnosis not present

## 2019-11-06 DIAGNOSIS — Z87891 Personal history of nicotine dependence: Secondary | ICD-10-CM

## 2019-11-06 DIAGNOSIS — Z86718 Personal history of other venous thrombosis and embolism: Secondary | ICD-10-CM | POA: Diagnosis not present

## 2019-11-06 DIAGNOSIS — I4891 Unspecified atrial fibrillation: Secondary | ICD-10-CM | POA: Diagnosis present

## 2019-11-06 DIAGNOSIS — N179 Acute kidney failure, unspecified: Secondary | ICD-10-CM | POA: Diagnosis not present

## 2019-11-06 DIAGNOSIS — D631 Anemia in chronic kidney disease: Secondary | ICD-10-CM | POA: Diagnosis present

## 2019-11-06 DIAGNOSIS — C155 Malignant neoplasm of lower third of esophagus: Secondary | ICD-10-CM | POA: Diagnosis present

## 2019-11-06 DIAGNOSIS — I272 Pulmonary hypertension, unspecified: Secondary | ICD-10-CM | POA: Diagnosis present

## 2019-11-06 DIAGNOSIS — F418 Other specified anxiety disorders: Secondary | ICD-10-CM | POA: Diagnosis present

## 2019-11-06 DIAGNOSIS — I890 Lymphedema, not elsewhere classified: Secondary | ICD-10-CM | POA: Diagnosis present

## 2019-11-06 DIAGNOSIS — I5023 Acute on chronic systolic (congestive) heart failure: Secondary | ICD-10-CM | POA: Diagnosis not present

## 2019-11-06 DIAGNOSIS — Z9221 Personal history of antineoplastic chemotherapy: Secondary | ICD-10-CM | POA: Diagnosis not present

## 2019-11-06 DIAGNOSIS — Z9981 Dependence on supplemental oxygen: Secondary | ICD-10-CM

## 2019-11-06 DIAGNOSIS — K219 Gastro-esophageal reflux disease without esophagitis: Secondary | ICD-10-CM | POA: Diagnosis present

## 2019-11-06 DIAGNOSIS — R0902 Hypoxemia: Secondary | ICD-10-CM | POA: Diagnosis present

## 2019-11-06 DIAGNOSIS — D472 Monoclonal gammopathy: Secondary | ICD-10-CM | POA: Diagnosis present

## 2019-11-06 DIAGNOSIS — J449 Chronic obstructive pulmonary disease, unspecified: Secondary | ICD-10-CM | POA: Diagnosis present

## 2019-11-06 DIAGNOSIS — T508X5A Adverse effect of diagnostic agents, initial encounter: Secondary | ICD-10-CM | POA: Diagnosis not present

## 2019-11-06 DIAGNOSIS — Z9012 Acquired absence of left breast and nipple: Secondary | ICD-10-CM

## 2019-11-06 DIAGNOSIS — T502X5A Adverse effect of carbonic-anhydrase inhibitors, benzothiadiazides and other diuretics, initial encounter: Secondary | ICD-10-CM | POA: Diagnosis not present

## 2019-11-06 DIAGNOSIS — I130 Hypertensive heart and chronic kidney disease with heart failure and stage 1 through stage 4 chronic kidney disease, or unspecified chronic kidney disease: Secondary | ICD-10-CM | POA: Diagnosis present

## 2019-11-06 DIAGNOSIS — Z853 Personal history of malignant neoplasm of breast: Secondary | ICD-10-CM

## 2019-11-06 DIAGNOSIS — Z9049 Acquired absence of other specified parts of digestive tract: Secondary | ICD-10-CM

## 2019-11-06 DIAGNOSIS — Z7951 Long term (current) use of inhaled steroids: Secondary | ICD-10-CM

## 2019-11-06 DIAGNOSIS — R0602 Shortness of breath: Secondary | ICD-10-CM | POA: Diagnosis present

## 2019-11-06 LAB — CBC
HCT: 31.1 % — ABNORMAL LOW (ref 36.0–46.0)
Hemoglobin: 9.8 g/dL — ABNORMAL LOW (ref 12.0–15.0)
MCH: 29.6 pg (ref 26.0–34.0)
MCHC: 31.5 g/dL (ref 30.0–36.0)
MCV: 94 fL (ref 80.0–100.0)
Platelets: 302 10*3/uL (ref 150–400)
RBC: 3.31 MIL/uL — ABNORMAL LOW (ref 3.87–5.11)
RDW: 19 % — ABNORMAL HIGH (ref 11.5–15.5)
WBC: 5.8 10*3/uL (ref 4.0–10.5)
nRBC: 1.7 % — ABNORMAL HIGH (ref 0.0–0.2)

## 2019-11-06 LAB — BASIC METABOLIC PANEL
Anion gap: 8 (ref 5–15)
BUN: 24 mg/dL — ABNORMAL HIGH (ref 8–23)
CO2: 18 mmol/L — ABNORMAL LOW (ref 22–32)
Calcium: 9.1 mg/dL (ref 8.9–10.3)
Chloride: 116 mmol/L — ABNORMAL HIGH (ref 98–111)
Creatinine, Ser: 1.84 mg/dL — ABNORMAL HIGH (ref 0.44–1.00)
GFR calc Af Amer: 33 mL/min — ABNORMAL LOW (ref >60)
GFR calc non Af Amer: 28 mL/min — ABNORMAL LOW (ref >60)
Glucose, Bld: 84 mg/dL (ref 70–99)
Potassium: 4.6 mmol/L (ref 3.5–5.1)
Sodium: 142 mmol/L (ref 135–145)

## 2019-11-06 LAB — TROPONIN I (HIGH SENSITIVITY)
Troponin I (High Sensitivity): 12 ng/L (ref <18)
Troponin I (High Sensitivity): 12 ng/L (ref <18)

## 2019-11-06 LAB — BRAIN NATRIURETIC PEPTIDE: B Natriuretic Peptide: 1500 pg/mL — ABNORMAL HIGH (ref 0.0–100.0)

## 2019-11-06 MED ORDER — FERROUS SULFATE 325 (65 FE) MG PO TABS
325.0000 mg | ORAL_TABLET | Freq: Two times a day (BID) | ORAL | Status: DC
  Administered 2019-11-06 – 2019-11-11 (×10): 325 mg via ORAL
  Filled 2019-11-06 (×10): qty 1

## 2019-11-06 MED ORDER — SODIUM CHLORIDE 0.9% FLUSH: 3.0000 mL | INTRAVENOUS | Status: DC | PRN

## 2019-11-06 MED ORDER — ENOXAPARIN SODIUM 30 MG/0.3ML ~~LOC~~ SOLN
30.0000 mg | SUBCUTANEOUS | Status: DC
  Administered 2019-11-06 – 2019-11-09 (×4): 30 mg via SUBCUTANEOUS
  Filled 2019-11-06 (×5): qty 0.3

## 2019-11-06 MED ORDER — UMECLIDINIUM BROMIDE 62.5 MCG/INH IN AEPB
1.0000 | INHALATION_SPRAY | Freq: Every day | RESPIRATORY_TRACT | Status: DC
  Administered 2019-11-07 – 2019-11-11 (×5): 1 via RESPIRATORY_TRACT
  Filled 2019-11-06: qty 7

## 2019-11-06 MED ORDER — FLUTICASONE FUROATE-VILANTEROL 100-25 MCG/INH IN AEPB
1.0000 | INHALATION_SPRAY | Freq: Every day | RESPIRATORY_TRACT | Status: DC
  Administered 2019-11-07 – 2019-11-11 (×5): 1 via RESPIRATORY_TRACT
  Filled 2019-11-06: qty 28

## 2019-11-06 MED ORDER — GUAIFENESIN-DM 100-10 MG/5ML PO SYRP: 5.0000 mL | ORAL_SOLUTION | ORAL | Status: DC | PRN

## 2019-11-06 MED ORDER — LOPERAMIDE HCL 2 MG PO CAPS
2.0000 mg | ORAL_CAPSULE | ORAL | Status: DC | PRN
  Administered 2019-11-07 – 2019-11-11 (×9): 2 mg via ORAL
  Filled 2019-11-06 (×8): qty 1

## 2019-11-06 MED ORDER — PANTOPRAZOLE SODIUM 40 MG PO TBEC
40.0000 mg | DELAYED_RELEASE_TABLET | Freq: Two times a day (BID) | ORAL | Status: DC
  Administered 2019-11-06 – 2019-11-11 (×10): 40 mg via ORAL
  Filled 2019-11-06 (×10): qty 1

## 2019-11-06 MED ORDER — NEPRO/CARBSTEADY PO LIQD
237.0000 mL | Freq: Three times a day (TID) | ORAL | Status: DC
  Administered 2019-11-06 – 2019-11-10 (×11): 237 mL via ORAL

## 2019-11-06 MED ORDER — SODIUM CHLORIDE 0.9% FLUSH
3.0000 mL | Freq: Two times a day (BID) | INTRAVENOUS | Status: DC
  Administered 2019-11-06 – 2019-11-10 (×8): 3 mL via INTRAVENOUS

## 2019-11-06 MED ORDER — CALCIUM CARBONATE-VITAMIN D 500-200 MG-UNIT PO TABS
1.0000 | ORAL_TABLET | Freq: Every day | ORAL | Status: DC
  Administered 2019-11-06 – 2019-11-11 (×6): 1 via ORAL
  Filled 2019-11-06 (×6): qty 1

## 2019-11-06 MED ORDER — SUCRALFATE 1 GM/10ML PO SUSP
1.0000 g | Freq: Four times a day (QID) | ORAL | Status: DC | PRN
  Filled 2019-11-06: qty 10

## 2019-11-06 MED ORDER — FUROSEMIDE 10 MG/ML IJ SOLN
40.0000 mg | Freq: Two times a day (BID) | INTRAMUSCULAR | Status: DC
  Administered 2019-11-06 – 2019-11-08 (×4): 40 mg via INTRAVENOUS
  Filled 2019-11-06 (×4): qty 4

## 2019-11-06 MED ORDER — IOHEXOL 350 MG/ML SOLN
60.0000 mL | Freq: Once | INTRAVENOUS | Status: AC | PRN
  Administered 2019-11-06: 15:00:00 60 mL via INTRAVENOUS

## 2019-11-06 MED ORDER — CARVEDILOL 6.25 MG PO TABS
3.1250 mg | ORAL_TABLET | Freq: Two times a day (BID) | ORAL | Status: DC
  Administered 2019-11-06 – 2019-11-10 (×7): 3.125 mg via ORAL
  Filled 2019-11-06 (×8): qty 1

## 2019-11-06 MED ORDER — FUROSEMIDE 10 MG/ML IJ SOLN
40.0000 mg | Freq: Once | INTRAMUSCULAR | Status: AC
  Administered 2019-11-06: 40 mg via INTRAVENOUS
  Filled 2019-11-06: qty 4

## 2019-11-06 MED ORDER — ADULT MULTIVITAMIN W/MINERALS CH
1.0000 | ORAL_TABLET | Freq: Every day | ORAL | Status: DC
  Administered 2019-11-06 – 2019-11-11 (×6): 1 via ORAL
  Filled 2019-11-06 (×6): qty 1

## 2019-11-06 MED ORDER — ACETAMINOPHEN 325 MG PO TABS
650.0000 mg | ORAL_TABLET | Freq: Four times a day (QID) | ORAL | Status: DC | PRN
  Administered 2019-11-07 – 2019-11-08 (×2): 650 mg via ORAL
  Filled 2019-11-06 (×2): qty 2

## 2019-11-06 MED ORDER — SODIUM CHLORIDE 0.9 % IV SOLN: 250.0000 mL | INTRAVENOUS | Status: DC | PRN

## 2019-11-06 MED ORDER — CITALOPRAM HYDROBROMIDE 20 MG PO TABS
40.0000 mg | ORAL_TABLET | Freq: Every day | ORAL | Status: DC
  Administered 2019-11-07 – 2019-11-11 (×5): 40 mg via ORAL
  Filled 2019-11-06 (×5): qty 2

## 2019-11-06 MED ORDER — CALCIUM CARBONATE ANTACID 500 MG PO CHEW
0.5000 | CHEWABLE_TABLET | Freq: Every day | ORAL | Status: DC
  Administered 2019-11-06 – 2019-11-11 (×6): 100 mg via ORAL
  Filled 2019-11-06 (×6): qty 1

## 2019-11-06 MED ORDER — FLUTICASONE-UMECLIDIN-VILANT 100-62.5-25 MCG/INH IN AEPB: 1.0000 | INHALATION_SPRAY | Freq: Every day | RESPIRATORY_TRACT | Status: DC

## 2019-11-06 MED ORDER — ALBUTEROL SULFATE (2.5 MG/3ML) 0.083% IN NEBU: 2.5000 mg | INHALATION_SOLUTION | Freq: Four times a day (QID) | RESPIRATORY_TRACT | Status: DC | PRN

## 2019-11-06 MED ORDER — SODIUM BICARBONATE 650 MG PO TABS
1300.0000 mg | ORAL_TABLET | Freq: Three times a day (TID) | ORAL | Status: DC
  Administered 2019-11-06 – 2019-11-11 (×15): 1300 mg via ORAL
  Filled 2019-11-06 (×15): qty 2

## 2019-11-06 MED ORDER — ONDANSETRON HCL 4 MG/2ML IJ SOLN: 4.0000 mg | Freq: Four times a day (QID) | INTRAMUSCULAR | Status: DC | PRN

## 2019-11-06 NOTE — ED Provider Notes (Signed)
[facility] Emergency Department Provider Note    First MD Initiated Contact with Patient 11/06/19 1345     (approximate)  I have reviewed the triage vital signs and the nursing notes.   HISTORY  Chief Complaint Shortness of Breath    HPI Danielle [name] is a 64 y.o. female with extensive past medical history as listed below not on any home oxygen recent COVID-19 infection represents to the ER for worsening shortness of breath exertional dyspnea and fatigue.  Not having worsening fevers.  Was discontinued from her Lasix due to renal insufficiency.  She denies being on any anticoagulation.  Has not had any hemoptysis.   Past Medical History:  Diagnosis Date  . Anxiety   . Arthritis   . Breast cancer ([place]) 2000  . Cancer ([place]) left    breast cancer 2000, chemo tx's with total mastectomy and lymph nodes resected.   . Cancer of right lung ([place]) 02/21/2016   rad tx's.   . CHF (congestive heart failure) ([place])   . COPD (chronic obstructive pulmonary disease) ([place])   . Dependence on supplemental oxygen   . Depression   . Heart murmur   . Hypertension   . Lung nodule   . Lymphedema   . Personal history of chemotherapy   . Personal history of radiation therapy   . Shortness of breath dyspnea    with exertion  . Status post chemotherapy 2001   left breast cancer  . Status post radiation therapy 2001   left breast cancer   Family History  Problem Relation Age of Onset  . Breast cancer Mother 57  . Cancer Mother        Breast   . Cirrhosis Father   . Breast cancer Paternal Aunt 38  . Cancer Maternal Aunt        Breast    Past Surgical History:  Procedure Laterality Date  . Breast Biospy Left    [facility]  . BREAST SURGERY    . COLONOSCOPY N/A 04/30/2018   Procedure: COLONOSCOPY;  Surgeon: Virgel Manifold, MD;  Location: [facility] ENDOSCOPY;  Service: Endoscopy;  Laterality: N/A;  . COLONOSCOPY N/A 07/22/2018   Procedure: COLONOSCOPY;  Surgeon:  Virgel Manifold, MD;  Location: [facility] ENDOSCOPY;  Service: Endoscopy;  Laterality: N/A;  . DILATION AND CURETTAGE OF UTERUS    . ELECTROMAGNETIC NAVIGATION BROCHOSCOPY Right 04/11/2016   Procedure: ELECTROMAGNETIC NAVIGATION BRONCHOSCOPY;  Surgeon: Vilinda Boehringer, MD;  Location: [facility] ORS;  Service: Cardiopulmonary;  Laterality: Right;  . ESOPHAGOGASTRODUODENOSCOPY N/A 07/22/2018   Procedure: ESOPHAGOGASTRODUODENOSCOPY (EGD);  Surgeon: Virgel Manifold, MD;  Location: [facility] ENDOSCOPY;  Service: Endoscopy;  Laterality: N/A;  . ESOPHAGOGASTRODUODENOSCOPY (EGD) WITH PROPOFOL N/A 05/07/2018   Procedure: ESOPHAGOGASTRODUODENOSCOPY (EGD) WITH PROPOFOL;  Surgeon: Lucilla Lame, MD;  Location: [facility] ENDOSCOPY;  Service: Endoscopy;  Laterality: N/A;  . ESOPHAGOGASTRODUODENOSCOPY (EGD) WITH PROPOFOL N/A 04/24/2019   Procedure: ESOPHAGOGASTRODUODENOSCOPY (EGD) WITH PROPOFOL;  Surgeon: Jonathon Bellows, MD;  Location: [facility] ENDOSCOPY;  Service: Gastroenterology;  Laterality: N/A;  . EUS N/A 05/07/2019   Procedure: FULL UPPER ENDOSCOPIC ULTRASOUND (EUS) RADIAL;  Surgeon: Jola Schmidt, MD;  Location: [facility] ENDOSCOPY;  Service: Endoscopy;  Laterality: N/A;  . history of colonoscopy]    . ILEOSCOPY N/A 07/22/2018   Procedure: ILEOSCOPY THROUGH STOMA;  Surgeon: Virgel Manifold, MD;  Location: [facility] ENDOSCOPY;  Service: Endoscopy;  Laterality: N/A;  . ILEOSTOMY    . ILEOSTOMY N/A 09/08/2018   Procedure: ILEOSTOMY REVISION POSSIBLE CREATION;  Surgeon: Herbert Pun, MD;  Location: [facility] ORS;  Service: General;  Laterality: N/A;  . ILEOSTOMY CLOSURE N/A 08/15/2018   Procedure: DILATION OF ILEOSTOMY STRICTURE;  Surgeon: Herbert Pun, MD;  Location: [facility] ORS;  Service: General;  Laterality: N/A;  . LAPAROTOMY Right 05/04/2018   Procedure: EXPLORATORY LAPAROTOMY right colectomy right and left ostomy;  Surgeon: Herbert Pun, MD;  Location: [facility] ORS;  Service: General;  Laterality: Right;  . LUNG  BIOPSY    . MASTECTOMY Left    2000, [facility]  . ROTATOR CUFF REPAIR Right    [facility]   Patient Active Problem List   Diagnosis Date Noted  . Diarrhea   . Acute renal failure superimposed on stage 3b chronic kidney disease ([place]) 10/29/2019  . GERD (gastroesophageal reflux disease) 10/29/2019  . Depression 10/29/2019  . Hyperkalemia 10/14/2019  . History of 2019 novel coronavirus disease (COVID-19) 10/14/2019  . Acute respiratory failure due to COVID-19 ([place]) 09/27/2019  . Respiratory tract infection due to COVID-19 virus 09/27/2019  . Acute blood loss anemia 06/20/2019  . Anemia 06/20/2019  . GI bleeding 06/19/2019  . Anemia of chronic kidney failure, stage 4 (severe) ([place]) 06/12/2019  . Malignant neoplasm of lower third of esophagus ([place]) 05/01/2019  . Acute on chronic respiratory failure with hypoxia and hypercapnia ([place]) 12/22/2018  . Ileostomy dysfunction ([place]) 09/28/2018  . Reflux esophagitis   . Iron deficiency anemia secondary to blood loss (chronic)   . SBO (small bowel obstruction) ([place]) 06/29/2018  . Chronic systolic heart failure ([place]) 06/09/2018  . HTN (hypertension) 06/09/2018  . Atrial fibrillation ([place]) 06/09/2018  . Lymphedema 06/09/2018  . Acute kidney injury superimposed on CKD ([place]) 06/04/2018  . Severe protein-calorie malnutrition ([place]) 06/04/2018  . Blood in stool   . Focal (segmental) acute (reversible) ischemia of large intestine ([place])   . Ulceration of intestine   . Diverticulosis of large intestine without diverticulitis   . Abnormal CT scan, colon   . Colitis   . COPD exacerbation ([place])   . Malnutrition of moderate degree 04/23/2018  . Palliative care by specialist   . DNR (do not resuscitate) discussion   . Weakness generalized   . Hyponatremia 02/10/2017  . Syncope 02/09/2017  . Lung nodule, solitary 07/25/2016  . Malignant neoplasm of upper lobe of right lung ([place]) 07/25/2016  . Carcinoma of overlapping sites of left breast in female, estrogen  receptor positive ([place]) 06/22/2016  . Multiple lung nodules 06/22/2016  . Lesion of right lung   . Breast cancer in female [facility]) 02/21/2016  . Moderate COPD (chronic obstructive pulmonary disease) ([place]) 08/24/2014      Prior to Admission medications   Medication Sig Start Date End Date Taking? Authorizing Provider  acetaminophen (TYLENOL) 325 MG tablet Take 2 tablets (650 mg total) by mouth every 6 (six) hours as needed for mild pain (or Fever >/= 101). 12/24/18   Gouru, Illene Silver, MD  albuterol (PROVENTIL HFA;VENTOLIN HFA) 108 (90 Base) MCG/ACT inhaler Inhale 2 puffs into the lungs every 6 (six) hours as needed for wheezing or shortness of breath. 04/24/18   Demetrios Loll, MD  calcium carbonate (TUMS - DOSED IN MG ELEMENTAL CALCIUM) 500 MG chewable tablet Chew 0.5 tablets (100 mg of elemental calcium total) by mouth daily. 11/05/19   Enzo Bi, MD  calcium-vitamin D (OSCAL WITH D) 250-125 MG-UNIT tablet Take 1 tablet by mouth daily.    [provider]  carvedilol (COREG) 3.125 MG tablet Take 3.125 mg by mouth 2 (two) times  daily with a meal.    [provider]  citalopram (CELEXA) 20 MG tablet Take 40 mg by mouth daily.     [provider]  ferrous sulfate 325 (65 FE) MG tablet Take 1 tablet (325 mg total) by mouth 2 (two) times daily with a meal. 07/02/18   [name], MD  Fluticasone-Umeclidin-Vilant 100-62.5-25 MCG/INH AEPB Inhale 1 puff into the lungs daily.  01/07/19   [provider]  furosemide (LASIX) 20 MG tablet Hold until follow up with kidney doctor. 11/04/19   Enzo Bi, MD  guaiFENesin-dextromethorphan (ROBITUSSIN DM) 100-10 MG/5ML syrup Take 5 mLs by mouth every 4 (four) hours as needed for cough.    [provider]  loperamide (IMODIUM A-D) 2 MG tablet Take 1 tablet (2 mg total) by mouth as needed for diarrhea or loose stools. 10/04/18   Herbert Pun, MD  Multiple Vitamin (MULTIVITAMIN WITH MINERALS) TABS tablet Take 1 tablet  by mouth daily. 06/06/18   Fritzi Mandes, MD  Nutritional Supplements (FEEDING SUPPLEMENT, NEPRO CARB STEADY,) LIQD Take 237 mLs by mouth 3 (three) times daily between meals. 10/21/19   Loletha Grayer, MD  pantoprazole (PROTONIX) 40 MG tablet Take 1 tablet (40 mg total) by mouth 2 (two) times daily before a meal. 07/23/18   Sudini, Alveta Heimlich, MD  sodium bicarbonate 650 MG tablet Take 2 tablets (1,300 mg total) by mouth 3 (three) times daily. 10/21/19   Loletha Grayer, MD  sucralfate (CARAFATE) 1 GM/10ML suspension Take 10 mLs (1 g total) by mouth 4 (four) times daily. 08/18/19   Noreene Filbert, MD    Allergies Patient has no known allergies.    Social History Social History   Tobacco Use  . Smoking status: Former Smoker    Packs/day: 0.50    Years: 20.00    Pack years: 10.00    Types: Cigarettes    Quit date: 07/02/2012    Years since quitting: 7.3  . Smokeless tobacco: Current User    Types: Snuff  . Tobacco comment: quit 2014  Substance Use Topics  . Alcohol use: Not Currently    Comment: Occasionally  . Drug use: No    Review of Systems Patient denies headaches, rhinorrhea, blurry vision, numbness, shortness of breath, chest pain, edema, cough, abdominal pain, nausea, vomiting, diarrhea, dysuria, fevers, rashes or hallucinations unless otherwise stated above in HPI. ____________________________________________   PHYSICAL EXAM:  VITAL SIGNS: Vitals:   11/06/19 1151 11/06/19 1400  BP: 122/76 139/89  Pulse: 88 83  Resp: 18 (!) 25  Temp: 98.5 F (36.9 C)   SpO2: 96% 97%    Constitutional: Alert and oriented.  Eyes: Conjunctivae are normal.  Head: Atraumatic. Nose: No congestion/rhinnorhea. Mouth/Throat: Mucous membranes are moist.   Neck: No stridor. Painless ROM.  Cardiovascular: Normal rate, regular rhythm. Grossly normal heart sounds.  Good peripheral circulation. Respiratory: mild tachypnea.  No retractions. Lungs with posterior crackles and diminished at the  bases Gastrointestinal: Soft and nontender. No distention. No abdominal bruits. No CVA tenderness. Genitourinary:  Musculoskeletal: No lower extremity tenderness.  1+ BLEedema.  No joint effusions. Neurologic:  Normal speech and language. No gross focal neurologic deficits are appreciated. No facial droop Skin:  Skin is warm, dry and intact. No rash noted. Psychiatric: Mood and affect are normal. Speech and behavior are normal.  ____________________________________________   LABS (all labs ordered are listed, but only abnormal results are displayed)  Results for orders placed or performed during the hospital encounter of 11/06/19 (from the past  24 hour(s))  Basic metabolic panel     Status: Abnormal   Collection Time: 11/06/19 12:05 PM  Result Value Ref Range   Sodium 142 135 - 145 mmol/L   Potassium 4.6 3.5 - 5.1 mmol/L   Chloride 116 (H) 98 - 111 mmol/L   CO2 18 (L) 22 - 32 mmol/L   Glucose, Bld 84 70 - 99 mg/dL   BUN 24 (H) 8 - 23 mg/dL   Creatinine, Ser 1.84 (H) 0.44 - 1.00 mg/dL   Calcium 9.1 8.9 - 10.3 mg/dL   GFR calc non Af Amer 28 (L) >60 mL/min   GFR calc Af Amer 33 (L) >60 mL/min   Anion gap 8 5 - 15  CBC     Status: Abnormal   Collection Time: 11/06/19 12:05 PM  Result Value Ref Range   WBC 5.8 4.0 - 10.5 K/uL   RBC 3.31 (L) 3.87 - 5.11 MIL/uL   Hemoglobin 9.8 (L) 12.0 - 15.0 g/dL   HCT 31.1 (L) 36.0 - 46.0 %   MCV 94.0 80.0 - 100.0 fL   MCH 29.6 26.0 - 34.0 pg   MCHC 31.5 30.0 - 36.0 g/dL   RDW 19.0 (H) 11.5 - 15.5 %   Platelets 302 150 - 400 K/uL   nRBC 1.7 (H) 0.0 - 0.2 %  Troponin I (High Sensitivity)     Status: None   Collection Time: 11/06/19 12:05 PM  Result Value Ref Range   Troponin I (High Sensitivity) 12 <18 ng/L  Brain natriuretic peptide     Status: Abnormal   Collection Time: 11/06/19  2:05 PM  Result Value Ref Range   B Natriuretic Peptide 1,500.0 (H) 0.0 - 100.0 pg/mL   ____________________________________________  EKG My review and  personal interpretation at Time: 11:56   Indication: sob  Rate: 90  Rhythm: sinus Axis: normal Other: normal intervals, nonsepcific st abn, abnml ekg ____________________________________________  RADIOLOGY  I personally reviewed all radiographic images ordered to evaluate for the above acute complaints and reviewed radiology reports and findings.  These findings were personally discussed with the patient.  Please see medical record for radiology report.  ____________________________________________   PROCEDURES  Procedure(s) performed:  Procedures    Critical Care performed: no ____________________________________________   INITIAL IMPRESSION / ASSESSMENT AND PLAN / ED COURSE  Pertinent labs & imaging results that were available during my care of the patient were reviewed by me and considered in my medical decision making (see chart for details).   DDX: Asthma, copd, CHF, pna, ptx, malignancy, Pe, anemia   ADELIE CROSWELL is a 64 y.o. who presents to the ED with shortness of breath status post recent Covid infection.  She is currently afebrile.  Her exam is certainly concerning for worsening congestive heart failure.  She is on any anticoagulation though given recent Covid with borderline tachycardia significant exertional dyspnea I do feel CT imaging indicated to exclude PE.  Will add on blood work.  Patient was significantly dyspneic with exertion.  Anticipate she may require hospitalization IV diuresis.  Patient will be signed out to oncoming MD, Dr. Ellender Hose. Have discussed with the patient and available family all diagnostics and treatments performed thus far and all questions were answered to the best of my ability. The patient demonstrates understanding and agreement with plan.      The patient was evaluated in Emergency Department today for the symptoms described in the history of present illness. He/she was evaluated in the context of the  global COVID-19 pandemic, which  necessitated consideration that the patient might be at risk for infection with the SARS-CoV-2 virus that causes COVID-19. Institutional protocols and algorithms that pertain to the evaluation of patients at risk for COVID-19 are in a state of rapid change based on information released by regulatory bodies including the CDC and federal and state organizations. These policies and algorithms were followed during the patient's care in the ED.  As part of my medical decision making, I reviewed the following data within the [place] notes reviewed and incorporated, Labs reviewed, notes from prior ED visits and [place] Controlled Substance Database   ____________________________________________   FINAL CLINICAL IMPRESSION(S) / ED DIAGNOSES  Final diagnoses:  Shortness of breath  Acute on chronic congestive heart failure, unspecified heart failure type ([place])      NEW MEDICATIONS STARTED DURING THIS VISIT:  New Prescriptions   No medications on file     Note:  This document was prepared using Dragon voice recognition software and may include unintentional dictation errors.    Merlyn Lot, MD 11/06/19 [phone]

## 2019-11-06 NOTE — Telephone Encounter (Signed)
Patient advised of doctor request to go to ER ASAP. Patient in agreement

## 2019-11-06 NOTE — ED Notes (Signed)
This RN attempted to call report at this time.  

## 2019-11-06 NOTE — ED Notes (Signed)
Pt ambulated in room with this RN. Pt oxygen saturation dropped to 92% on room air with labored respirations. Pt assisted back to bed by this RN. Pt encouraged to take deep breaths. Pt respirations returned to even and unlabored. MD Quentin Cornwall aware. Pt has oxygen at home that she wears on an as needed basis.

## 2019-11-06 NOTE — ED Triage Notes (Addendum)
Patient c/o SOB that started yesterday. Also c/o achy chest pain intermittently since Wednesday. Has oxygen at home and only wears as needed. Patient has ileostomy bag on right lower abdomen. Denies N/V/D

## 2019-11-06 NOTE — Progress Notes (Signed)
Anticoagulation monitoring(Lovenox):  64 yo female ordered Lovenox 40 mg Q24h  Filed Weights   11/06/19 1151  Weight: 121 lb (54.9 kg)   BMI 19.5   Lab Results  Component Value Date   CREATININE 1.84 (H) 11/06/2019   CREATININE 1.86 (H) 11/04/2019   CREATININE 2.35 (H) 11/03/2019   Estimated Creatinine Clearance: 26.4 mL/min (A) (by C-G formula based on SCr of 1.84 mg/dL (H)). Hemoglobin & Hematocrit     Component Value Date/Time   HGB 9.8 (L) 11/06/2019 1205   HGB 11.5 (L) 02/09/2015 1100   HCT 31.1 (L) 11/06/2019 1205   HCT 34.9 (L) 02/09/2015 1100     Per Protocol for Patient with estCrcl < 30 ml/min and BMI < 40, will transition to Lovenox 30 mg Q24h.

## 2019-11-06 NOTE — Telephone Encounter (Signed)
Patient called reporting that since she has been home from hospital, she is having problems. She reports shortness of breath, having to hold her "stomach" to help her breathe and she is using her O2 also. She reports that she has blood in her ileostomy bag, left arm swelling and swelling of bilaterally feet. She is asking if she should go to the ER or if Dr B can see her. She missed her appointment with her PCP this morning and they cannot see her until next week. Please advise

## 2019-11-06 NOTE — H&P (Signed)
[facility] NAME: Danielle [name]    MR#:  [id number]  DATE OF BIRTH:  1956-06-28  DATE OF ADMISSION:  11/06/2019  PRIMARY CARE PHYSICIAN: [facility] [place]   REQUESTING/REFERRING PHYSICIAN: Duffy Bruce, MD  CHIEF COMPLAINT:   Chief Complaint  Patient presents with  . Shortness of Breath    HISTORY OF PRESENT ILLNESS:  Danielle [name]  is a 64 y.o. female with a known history of chronic systolic heart failure with several readmissions. COVID-19 from 12/13->12/18. she had a positive test on 09/27/2019. She had finished course of remdesivir and steroids. Another one 12/30->1/6 for renal failure followed by 1/14->1/20 for for acute kidney injury and was hydrated with fluids. Lasix was requested to be held at D/C. Now she is coming back with exertional dyspnea and fatigue.   While in the ED, she is unable to ambulate across room without marked tachypnea and fatigue. BNP>1000 and edema, effusion noted on CXR and CT and is being admitted for further evaluation and management. PAST MEDICAL HISTORY:   Past Medical History:  Diagnosis Date  . Anxiety   . Arthritis   . Breast cancer ([place]) 2000  . Cancer ([place]) left    breast cancer 2000, chemo tx's with total mastectomy and lymph nodes resected.   . Cancer of right lung ([place]) 02/21/2016   rad tx's.   . CHF (congestive heart failure) ([place])   . COPD (chronic obstructive pulmonary disease) ([place])   . Dependence on supplemental oxygen   . Depression   . Heart murmur   . Hypertension   . Lung nodule   . Lymphedema   . Personal history of chemotherapy   . Personal history of radiation therapy   . Shortness of breath dyspnea    with exertion  . Status post chemotherapy 2001   left breast cancer  . Status post radiation therapy 2001   left breast cancer    PAST SURGICAL HISTORY:   Past Surgical History:  Procedure Laterality Date  . Breast Biospy Left    [facility]  . BREAST SURGERY    .  COLONOSCOPY N/A 04/30/2018   Procedure: COLONOSCOPY;  Surgeon: Virgel Manifold, MD;  Location: [facility] ENDOSCOPY;  Service: Endoscopy;  Laterality: N/A;  . COLONOSCOPY N/A 07/22/2018   Procedure: COLONOSCOPY;  Surgeon: Virgel Manifold, MD;  Location: [facility] ENDOSCOPY;  Service: Endoscopy;  Laterality: N/A;  . DILATION AND CURETTAGE OF UTERUS    . ELECTROMAGNETIC NAVIGATION BROCHOSCOPY Right 04/11/2016   Procedure: ELECTROMAGNETIC NAVIGATION BRONCHOSCOPY;  Surgeon: Vilinda Boehringer, MD;  Location: [facility] ORS;  Service: Cardiopulmonary;  Laterality: Right;  . ESOPHAGOGASTRODUODENOSCOPY N/A 07/22/2018   Procedure: ESOPHAGOGASTRODUODENOSCOPY (EGD);  Surgeon: Virgel Manifold, MD;  Location: [facility] ENDOSCOPY;  Service: Endoscopy;  Laterality: N/A;  . ESOPHAGOGASTRODUODENOSCOPY (EGD) WITH PROPOFOL N/A 05/07/2018   Procedure: ESOPHAGOGASTRODUODENOSCOPY (EGD) WITH PROPOFOL;  Surgeon: Lucilla Lame, MD;  Location: [facility] ENDOSCOPY;  Service: Endoscopy;  Laterality: N/A;  . ESOPHAGOGASTRODUODENOSCOPY (EGD) WITH PROPOFOL N/A 04/24/2019   Procedure: ESOPHAGOGASTRODUODENOSCOPY (EGD) WITH PROPOFOL;  Surgeon: Jonathon Bellows, MD;  Location: [facility] ENDOSCOPY;  Service: Gastroenterology;  Laterality: N/A;  . EUS N/A 05/07/2019   Procedure: FULL UPPER ENDOSCOPIC ULTRASOUND (EUS) RADIAL;  Surgeon: Jola Schmidt, MD;  Location: [facility] ENDOSCOPY;  Service: Endoscopy;  Laterality: N/A;  . history of colonoscopy]    . ILEOSCOPY N/A 07/22/2018   Procedure: ILEOSCOPY THROUGH STOMA;  Surgeon: Virgel Manifold, MD;  Location: [facility] ENDOSCOPY;  Service: Endoscopy;  Laterality:  N/A;  . ILEOSTOMY    . ILEOSTOMY N/A 09/08/2018   Procedure: ILEOSTOMY REVISION POSSIBLE CREATION;  Surgeon: Herbert Pun, MD;  Location: [facility] ORS;  Service: General;  Laterality: N/A;  . ILEOSTOMY CLOSURE N/A 08/15/2018   Procedure: DILATION OF ILEOSTOMY STRICTURE;  Surgeon: Herbert Pun, MD;  Location: [facility] ORS;  Service: General;  Laterality:  N/A;  . LAPAROTOMY Right 05/04/2018   Procedure: EXPLORATORY LAPAROTOMY right colectomy right and left ostomy;  Surgeon: Herbert Pun, MD;  Location: [facility] ORS;  Service: General;  Laterality: Right;  . LUNG BIOPSY    . MASTECTOMY Left    2000, [facility]  . ROTATOR CUFF REPAIR Right    [facility]    SOCIAL HISTORY:   Social History   Tobacco Use  . Smoking status: Former Smoker    Packs/day: 0.50    Years: 20.00    Pack years: 10.00    Types: Cigarettes    Quit date: 07/02/2012    Years since quitting: 7.3  . Smokeless tobacco: Current User    Types: Snuff  . Tobacco comment: quit 2014  Substance Use Topics  . Alcohol use: Not Currently    Comment: Occasionally    FAMILY HISTORY:   Family History  Problem Relation Age of Onset  . Breast cancer Mother 82  . Cancer Mother        Breast   . Cirrhosis Father   . Breast cancer Paternal Aunt 49  . Cancer Maternal Aunt        Breast     DRUG ALLERGIES:  No Known Allergies  REVIEW OF SYSTEMS:   Review of Systems  Constitutional: Positive for malaise/fatigue. Negative for diaphoresis, fever and weight loss.  HENT: Negative for ear discharge, ear pain, hearing loss, nosebleeds, sore throat and tinnitus.   Eyes: Negative for blurred vision and pain.  Respiratory: Positive for shortness of breath. Negative for cough, hemoptysis and wheezing.   Cardiovascular: Positive for leg swelling. Negative for chest pain, palpitations and orthopnea.  Gastrointestinal: Negative for abdominal pain, blood in stool, constipation, diarrhea, heartburn, nausea and vomiting.  Genitourinary: Negative for dysuria, frequency and urgency.  Musculoskeletal: Negative for back pain and myalgias.  Skin: Negative for itching and rash.  Neurological: Negative for dizziness, tingling, tremors, focal weakness, seizures, weakness and headaches.  Psychiatric/Behavioral: Negative for depression. The patient is not nervous/anxious.    MEDICATIONS AT HOME:    Prior to Admission medications   Medication Sig Start Date End Date Taking? Authorizing Provider  acetaminophen (TYLENOL) 325 MG tablet Take 2 tablets (650 mg total) by mouth every 6 (six) hours as needed for mild pain (or Fever >/= 101). 12/24/18   Gouru, Illene Silver, MD  albuterol (PROVENTIL HFA;VENTOLIN HFA) 108 (90 Base) MCG/ACT inhaler Inhale 2 puffs into the lungs every 6 (six) hours as needed for wheezing or shortness of breath. 04/24/18   Demetrios Loll, MD  calcium carbonate (TUMS - DOSED IN MG ELEMENTAL CALCIUM) 500 MG chewable tablet Chew 0.5 tablets (100 mg of elemental calcium total) by mouth daily. 11/05/19   Enzo Bi, MD  calcium-vitamin D (OSCAL WITH D) 250-125 MG-UNIT tablet Take 1 tablet by mouth daily.    [provider]  carvedilol (COREG) 3.125 MG tablet Take 3.125 mg by mouth 2 (two) times daily with a meal.    [provider]  citalopram (CELEXA) 20 MG tablet Take 40 mg by mouth daily.     [provider]  ferrous sulfate 325 (65 FE) MG tablet  Take 1 tablet (325 mg total) by mouth 2 (two) times daily with a meal. 07/02/18   [name], MD  Fluticasone-Umeclidin-Vilant 100-62.5-25 MCG/INH AEPB Inhale 1 puff into the lungs daily.  01/07/19   [provider]  furosemide (LASIX) 20 MG tablet Hold until follow up with kidney doctor. 11/04/19   Enzo Bi, MD  guaiFENesin-dextromethorphan (ROBITUSSIN DM) 100-10 MG/5ML syrup Take 5 mLs by mouth every 4 (four) hours as needed for cough.    [provider]  loperamide (IMODIUM A-D) 2 MG tablet Take 1 tablet (2 mg total) by mouth as needed for diarrhea or loose stools. 10/04/18   Herbert Pun, MD  Multiple Vitamin (MULTIVITAMIN WITH MINERALS) TABS tablet Take 1 tablet by mouth daily. 06/06/18   Fritzi Mandes, MD  Nutritional Supplements (FEEDING SUPPLEMENT, NEPRO CARB STEADY,) LIQD Take 237 mLs by mouth 3 (three) times daily between meals. 10/21/19   Loletha Grayer, MD  pantoprazole  (PROTONIX) 40 MG tablet Take 1 tablet (40 mg total) by mouth 2 (two) times daily before a meal. 07/23/18   Sudini, Alveta Heimlich, MD  sodium bicarbonate 650 MG tablet Take 2 tablets (1,300 mg total) by mouth 3 (three) times daily. 10/21/19   Loletha Grayer, MD  sucralfate (CARAFATE) 1 GM/10ML suspension Take 10 mLs (1 g total) by mouth 4 (four) times daily. 08/18/19   Noreene Filbert, MD      VITAL SIGNS:  Blood pressure 139/89, pulse 83, temperature 98.5 F (36.9 C), temperature source Oral, resp. rate (!) 25, height 5\' 6"  (1.676 m), weight 54.9 kg, SpO2 97 %.  PHYSICAL EXAMINATION:  Physical Exam  GENERAL:  64 y.o.-year-old patient lying in the bed with no acute distress.  EYES: Pupils equal, round, reactive to light and accommodation. No scleral icterus. Extraocular muscles intact.  HEENT: Head atraumatic, normocephalic. Oropharynx and nasopharynx clear.  NECK:  Supple, no jugular venous distention. No thyroid enlargement, no tenderness.  LUNGS: Normal breath sounds bilaterally, no wheezing, rales,rhonchi or crepitation. No use of accessory muscles of respiration.  CARDIOVASCULAR: S1, S2 normal. No murmurs, rubs, or gallops.  ABDOMEN: Soft, nontender, nondistended. Bowel sounds present. No organomegaly or mass.  EXTREMITIES: No cyanosis, or clubbing. 1+ BLEedema NEUROLOGIC: Cranial nerves II through XII are intact. Muscle strength 5/5 in all extremities. Sensation intact. Gait not checked.  PSYCHIATRIC: The patient is alert and oriented x 3.  SKIN: No obvious rash, lesion, or ulcer.   LABORATORY PANEL:   CBC Recent Labs  Lab 11/06/19 1205  WBC 5.8  HGB 9.8*  HCT 31.1*  PLT 302   ------------------------------------------------------------------------------------------------------------------  Chemistries  Recent Labs  Lab 11/06/19 1205  NA 142  K 4.6  CL 116*  CO2 18*  GLUCOSE 84  BUN 24*  CREATININE 1.84*  CALCIUM 9.1    ------------------------------------------------------------------------------------------------------------------  Cardiac Enzymes No results for input(s): TROPONINI in the last 168 hours. ------------------------------------------------------------------------------------------------------------------  RADIOLOGY:  DG Chest 2 View  Result Date: 11/06/2019 CLINICAL DATA:  Chest pain. Shortness of breath. COVID-19 positive. EXAM: CHEST - 2 VIEW COMPARISON:  Chest x-ray 11/03/2019, 06/19/2019.  CT 11/19/2018. FINDINGS: Mediastinum and hilar structures normal. Stable changes of scarring right upper lung. Bibasilar interstitial prominence with nodularity. These changes may be related pneumonitis. Follow-up exam to demonstrate clearing suggested. New small bilateral pleural effusions, right side greater than left. No pneumothorax. IMPRESSION: 1. Bibasilar interstitial prominence with nodularity. These changes may be related to pneumonitis. Follow-up exams suggested to demonstrate resolution. 2. New small bilateral pleural effusions, right side greater than  left. 3.   Persistent changes of scarring right upper lung. Electronically Signed   By: Marcello Moores  Register   On: 11/06/2019 13:04   CT Angio Chest PE W and/or Wo Contrast  Result Date: 11/06/2019 CLINICAL DATA:  Shortness of breath, recent COVID EXAM: CT ANGIOGRAPHY CHEST WITH CONTRAST TECHNIQUE: Multidetector CT imaging of the chest was performed using the standard protocol during bolus administration of intravenous contrast. Multiplanar CT image reconstructions and MIPs were obtained to evaluate the vascular anatomy. CONTRAST:  75mL OMNIPAQUE IOHEXOL 350 MG/ML SOLN COMPARISON:  11/19/2018 FINDINGS: Cardiovascular: Satisfactory opacification of the pulmonary arteries to the segmental level. No evidence of pulmonary embolism. Normal heart size. No pericardial effusion. Coronary artery calcification. Calcified plaque is present along the thoracic aorta.  Mediastinum/Nodes: There is no mediastinal, hilar, or axillary adenopathy. Lungs/Pleura: Small to moderate right pleural effusion. Greater confluent appearance of anterior right upper lobe opacity. Similar appearance of contiguous more superior right upper lobe opacity. Mild bibasilar atelectasis. Moderate emphysema. Upper Abdomen: No acute abnormality. Musculoskeletal: No acute abnormality. Review of the MIP images confirms the above findings. IMPRESSION: No acute pulmonary embolism. Small to moderate right pleural effusion. More confluent appearance of anterior right upper lobe treated mass. Emphysema and aortic atherosclerosis. Electronically Signed   By: Macy Mis M.D.   On: 11/06/2019 15:50   IMPRESSION AND PLAN:  Danielle [name] is a 64 y.o. female with medical history significant of hypertension, COPD, GERD, depression with anxiety, breast cancer (mastectomy, chemo and radiation therapy), lung cancer (radiation therapy), CHF with EF 35-40%, iron deficiency anemia, CKD-3b, ileostomy, positive covid 19 Ag on 12/13 and positive covid 19 PCR on 12/30 being admitted for CHF exacerbation  Acute on chronic systolic CHF 2D echo on 02/20/5276 showed EF 35-40%.   - IV Lasix 40 twice daily Repeat echo Cardiology consultation Monitor on telemetry Strict I's and O's and daily weights  chronic kidney disease stage IIIb:  Stable  Moderate COPD (chronic obstructive pulmonary disease) ([place]): -Continue bronchodilators.  Depression:  No SI or HI -continue home meds  HTN (hypertension):  Monitor while on Coreg  Iron deficiency anemia secondary to blood loss (chronic): Hemoglobin 9.8. -Continue iron supplement  GERD (gastroesophageal reflux disease):  -PPI   multiple readmissions in last 39-month, consider palliative care consultation if she is still here early next week.   All the records are reviewed and case discussed with ED provider. Management plans discussed with the patient,  nursing and they are in agreement.  CODE STATUS: Full code  TOTAL TIME TAKING CARE OF THIS PATIENT: 45 minutes.    Max Sane M.D on 11/06/2019 at 4:51 PM  [facility]   CC: Primary care physician; [facility]   Note: This dictation was prepared with Dragon dictation along with smaller phrase technology. Any transcriptional errors that result from this process are unintentional.

## 2019-11-06 NOTE — ED Provider Notes (Signed)
Assumed care at 3 PM. Briefly, 64 yo F here with SOB w/ exertion. Unable to ambulate across room without marked tachypnea and fatigue. BNP>1000 and edema, effusion noted on CXR and CT. Admit to Medicine for IV DIuresis.   Duffy Bruce, MD 11/06/19 1558

## 2019-11-06 NOTE — Telephone Encounter (Signed)
Per V/o Dr.Brahmanday - please have patient go to ER ASAP

## 2019-11-07 ENCOUNTER — Inpatient Hospital Stay
Admit: 2019-11-07 | Discharge: 2019-11-07 | Disposition: A | Discharge status: Home / Self Care | Payer: Medicare Other | Attending: Internal Medicine | Admitting: Internal Medicine

## 2019-11-07 LAB — BASIC METABOLIC PANEL
Anion gap: 10 (ref 5–15)
BUN: 24 mg/dL — ABNORMAL HIGH (ref 8–23)
CO2: 21 mmol/L — ABNORMAL LOW (ref 22–32)
Calcium: 9.1 mg/dL (ref 8.9–10.3)
Chloride: 111 mmol/L (ref 98–111)
Creatinine, Ser: 1.86 mg/dL — ABNORMAL HIGH (ref 0.44–1.00)
GFR calc Af Amer: 32 mL/min — ABNORMAL LOW (ref >60)
GFR calc non Af Amer: 28 mL/min — ABNORMAL LOW (ref >60)
Glucose, Bld: 75 mg/dL (ref 70–99)
Potassium: 3.8 mmol/L (ref 3.5–5.1)
Sodium: 142 mmol/L (ref 135–145)

## 2019-11-07 LAB — SARS CORONAVIRUS 2 (TAT 6-24 HRS): SARS Coronavirus 2: NEGATIVE

## 2019-11-07 NOTE — Plan of Care (Signed)
  Problem: Education: Goal: Knowledge of General Education information will improve Description: Including pain rating scale, medication(s)/side effects and non-pharmacologic comfort measures Outcome: Progressing   Problem: Health Behavior/Discharge Planning: Goal: Ability to manage health-related needs will improve Outcome: Progressing   Problem: Clinical Measurements: Goal: Ability to maintain clinical measurements within normal limits will improve Outcome: Progressing   Problem: Clinical Measurements: Goal: Ability to maintain clinical measurements within normal limits will improve Outcome: Progressing Goal: Will remain free from infection Outcome: Progressing Goal: Diagnostic test results will improve Outcome: Progressing Goal: Respiratory complications will improve Outcome: Progressing Goal: Cardiovascular complication will be avoided Outcome: Progressing

## 2019-11-07 NOTE — Progress Notes (Addendum)
PROGRESS NOTE    Danielle [name]  AYT:[id number] DOB: [birth year] DOA: 11/06/2019 PCP: [facility] [place] a 64 y.o.femalewith medical history significant ofhypertension, COPD, GERD, depression with anxiety, breast cancer (mastectomy, chemo and radiation therapy), lung cancer (radiation therapy), CHF with EF 35-40%, MGUS, CKD-3b, ileostomy,positive covid 19 Ag on 12/13 and positive covid 19 PCR on 12/30 being admitted for CHF exacerbation  Assessment & Plan:   Active Problems:   CHF exacerbation (HCC)  Acute on chronic systolic CHF 2D echo on 12/18/5730 showed EF 35-40%.  - IV Lasix 40 twice daily.  -1.7 L fluid balance Repeat echo pending Cardiology consultation pending Monitor on telemetry Strict I's and O's and daily weights  chronic kidney disease stage IIIb: Stable, if creatinine worsens we can consider nephrology consultation  Moderate COPD (chronic obstructive pulmonary disease) ([place]): -Continue bronchodilators.  Depression: No SI or HI -continue home meds  HTN (hypertension): Monitor while on Coreg  Anemia of chronic kidney disease:Hemoglobin 9.8 s/p 1 packed RBC transfusion on 1/19 - getting Epogen as an outpatient per hematology Dr. Rogue Bussing - s/p ileostomy secondary to colon ulceration in 2019 at the hepatic flexure. Follows with Dr. 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  Currently s/p definitive radiation [dec 7th 2020]. Clinically stable.  Oncology is planning PET scan for follow-up next 4 to 6 weeks  GERD (gastroesophageal reflux disease):  -PPI  Severe protein calorie malnourishment: recently was diagnosed with distal esophageal cancer she follows with [facility]/Dr. Rogue Bussing. Continue w/ nutritional supplements   Generalized weakness: PT/OT consulted      DVT prophylaxis: heparin Code Status: full  Family Communication:  Discussed with patient directly Disposition Plan: In next 2 to 3 days depending on clinical condition   Consultants:   Cardio    Procedures: none   Antimicrobials:    Subjective: Pt c/o shortness of breath   Objective: Vitals:   11/06/19 1915 11/07/19 0400 11/07/19 0801 11/07/19 1211  BP: 135/87 125/79 120/76   Pulse: 85 91 84   Resp: 20 16 20    Temp: 98.2 F (36.8 C) 98.3 F (36.8 C) 98.6 F (37 C)   TempSrc: Oral Oral Oral   SpO2: 96% 95% 95% 93%  Weight:  55.9 kg    Height:        Intake/Output Summary (Last 24 hours) at 11/07/2019 1257 Last data filed at 11/07/2019 0410 Gross per 24 hour  Intake 451 ml  Output 2250 ml  Net -1799 ml   Filed Weights   11/06/19 1151 11/06/19 1825 11/07/19 0400  Weight: 54.9 kg 56.3 kg 55.9 kg    Examination:  General exam: Appears calm & comfortable  Respiratory system: decreaded breath sounds b/l. Cardiovascular system: S1 & S2 +.  No rubs, gallops or clicks.  Gastrointestinal system: Abdomen is nondistended, soft , NT. Ostomy bag in place. Normal bowel sounds heard. Central nervous system: Alert and oriented. Moves all 4 extremities Psychiatry: Judgement and insight appear normal. Flat mood and affect     Data Reviewed: I have personally reviewed following labs and imaging studies  CBC: Recent Labs  Lab 11/01/19 0432 11/02/19 0412 11/03/19 0610 11/04/19 0432 11/06/19 1205  WBC 5.2 5.2 6.2 5.1 5.8  HGB 7.6* 7.1* 7.3* 8.9* 9.8*  HCT 23.6* 21.8* 22.9* 28.0* 31.1*  MCV 96.7 97.3 99.1 94.6 94.0  PLT 291 283 287 286 202   Basic Metabolic Panel:  Recent Labs  Lab 11/02/19 0412 11/03/19 0610 11/04/19 0432 11/06/19 1205 11/07/19 0622  NA 137 141 141 142 142  K 4.2 4.7 4.7 4.6 3.8  CL 116* 119* 120* 116* 111  CO2 16* 15* 16* 18* 21*  GLUCOSE 83 80 83 84 75  BUN 53* 43* 38* 24* 24*  CREATININE 2.61* 2.35* 1.86* 1.84* 1.86*  CALCIUM 8.0* 8.2* 8.3* 9.1 9.1     Recent Results (from the past 240 hour(s))    C difficile quick scan w PCR reflex     Status: None   Collection Time: 10/31/19 12:21 PM   Specimen: Abdomen; Stool  Result Value Ref Range Status   C Diff antigen NEGATIVE NEGATIVE Final   C Diff toxin NEGATIVE NEGATIVE Final   C Diff interpretation No C. difficile detected.  Final    Comment: Performed at [facility], [place], [place] [address]  SARS CORONAVIRUS 2 (TAT 6-24 HRS) Nasopharyngeal Nasopharyngeal Swab     Status: None   Collection Time: 11/06/19  4:50 PM   Specimen: Nasopharyngeal Swab  Result Value Ref Range Status   SARS Coronavirus 2 NEGATIVE NEGATIVE Final    Comment: (NOTE) SARS-CoV-2 target nucleic acids are NOT DETECTED. The SARS-CoV-2 RNA is generally detectable in upper and lower respiratory specimens during the acute phase of infection. Negative results do not preclude SARS-CoV-2 infection, do not rule out co-infections with other pathogens, and should not be used as the sole basis for treatment or other patient management decisions. Negative results must be combined with clinical observations, patient history, and epidemiological information. The expected result is Negative. Fact Sheet for Patients: SugarRoll.be Fact Sheet for Healthcare Providers: https://www.woods-mathews.com/ This test is not yet approved or cleared by the [place] FDA and  has been authorized for detection and/or diagnosis of SARS-CoV-2 by FDA under an Emergency Use Authorization (EUA). This EUA will remain  in effect (meaning this test can be used) for the duration of the COVID-19 declaration under Section 56 4(b)(1) of the Act, 21 U.S.C. section 360bbb-3(b)(1), unless the authorization is terminated or revoked sooner. Performed at [facility], [place] [address], [place], [place] [address]          Radiology Studies: DG Chest 2 View  Result Date: 11/06/2019 CLINICAL DATA:  Chest pain. Shortness of  breath. COVID-19 positive. EXAM: CHEST - 2 VIEW COMPARISON:  Chest x-ray 11/03/2019, 06/19/2019.  CT 11/19/2018. FINDINGS: Mediastinum and hilar structures normal. Stable changes of scarring right upper lung. Bibasilar interstitial prominence with nodularity. These changes may be related pneumonitis. Follow-up exam to demonstrate clearing suggested. New small bilateral pleural effusions, right side greater than left. No pneumothorax. IMPRESSION: 1. Bibasilar interstitial prominence with nodularity. These changes may be related to pneumonitis. Follow-up exams suggested to demonstrate resolution. 2. New small bilateral pleural effusions, right side greater than left. 3.   Persistent changes of scarring right upper lung. Electronically Signed   By: Marcello Moores  Register   On: 11/06/2019 13:04   CT Angio Chest PE W and/or Wo Contrast  Result Date: 11/06/2019 CLINICAL DATA:  Shortness of breath, recent COVID EXAM: CT ANGIOGRAPHY CHEST WITH CONTRAST TECHNIQUE: Multidetector CT imaging of the chest was performed using the standard protocol during bolus administration of intravenous contrast. Multiplanar CT image reconstructions and MIPs were obtained to evaluate the vascular anatomy. CONTRAST:  72mL OMNIPAQUE IOHEXOL 350 MG/ML SOLN COMPARISON:  11/19/2018 FINDINGS: Cardiovascular: Satisfactory opacification of the pulmonary arteries to the segmental level. No evidence  of pulmonary embolism. Normal heart size. No pericardial effusion. Coronary artery calcification. Calcified plaque is present along the thoracic aorta. Mediastinum/Nodes: There is no mediastinal, hilar, or axillary adenopathy. Lungs/Pleura: Small to moderate right pleural effusion. Greater confluent appearance of anterior right upper lobe opacity. Similar appearance of contiguous more superior right upper lobe opacity. Mild bibasilar atelectasis. Moderate emphysema. Upper Abdomen: No acute abnormality. Musculoskeletal: No acute abnormality. Review of the MIP  images confirms the above findings. IMPRESSION: No acute pulmonary embolism. Small to moderate right pleural effusion. More confluent appearance of anterior right upper lobe treated mass. Emphysema and aortic atherosclerosis. Electronically Signed   By: Macy Mis M.D.   On: 11/06/2019 15:50        Scheduled Meds: . calcium carbonate  0.5 tablet Oral Daily  . calcium-vitamin D  1 tablet Oral Daily  . carvedilol  3.125 mg Oral BID WC  . citalopram  40 mg Oral Daily  . enoxaparin (LOVENOX) injection  30 mg Subcutaneous Q24H  . feeding supplement (NEPRO CARB STEADY)  237 mL Oral TID BM  . ferrous sulfate  325 mg Oral BID WC  . fluticasone furoate-vilanterol  1 puff Inhalation Daily   And  . umeclidinium bromide  1 puff Inhalation Daily  . furosemide  40 mg Intravenous BID  . multivitamin with minerals  1 tablet Oral Daily  . pantoprazole  40 mg Oral BID AC  . sodium bicarbonate  1,300 mg Oral TID  . sodium chloride flush  3 mL Intravenous Q12H   Continuous Infusions: . sodium chloride       LOS: 1 day    Time spent: 33 mins     Max Sane, MD [facility]  11/07/2019, 12:57 PM

## 2019-11-07 NOTE — Evaluation (Signed)
Physical Therapy Evaluation Patient Details Name: Danielle [name] MRN: [id number] DOB: 1955-11-09 Today's Date: 11/07/2019   History of Present Illness  Patient is 64 year old female admitted with SOB. PMH includes: COPD, GERD, depression with anxiety, breast Ca, lung Ca, CHF. Recently discharged from here.  Clinical Impression  Patient received sitting up on side of bed. Reports she is still feeling short of breath. Agrees to PT assessment. She performed transfer with supervision and ambulated 50 feet in room without ad and supervision. No LOB. O2 sats after ambulation down to 91%. With rest improved to 93%. Patient will continue to benefit from skilled PT while here to improve activity tolerance and strength.       Follow Up Recommendations No PT follow up    Equipment Recommendations  None recommended by PT    Recommendations for Other Services       Precautions / Restrictions Precautions Precautions: Fall Restrictions Weight Bearing Restrictions: No Other Position/Activity Restrictions: ostomy      Mobility  Bed Mobility               General bed mobility comments: patient received sitting up on side of bed  Transfers Overall transfer level: Needs assistance Equipment used: None Transfers: Sit to/from Stand Sit to Stand: Supervision            Ambulation/Gait Ambulation/Gait assistance: Supervision Gait Distance (Feet): 50 Feet Assistive device: None Gait Pattern/deviations: Step-through pattern Gait velocity: decreased   General Gait Details: steady, no AD, no lOB, reports continued SOB  Stairs            Wheelchair Mobility    Modified Rankin (Stroke Patients Only)       Balance Overall balance assessment: No apparent balance deficits (not formally assessed) Sitting-balance support: Feet supported Sitting balance-Leahy Scale: Normal     Standing balance support: No upper extremity supported;During functional activity Standing  balance-Leahy Scale: Good                               Pertinent Vitals/Pain Pain Assessment: No/denies pain    Home Living Family/patient expects to be discharged to:: Private residence Living Arrangements: Children Available Help at Discharge: Family;Available PRN/intermittently Type of Home: Mobile home Home Access: Stairs to enter Entrance Stairs-Rails: Right Entrance Stairs-Number of Steps: 5 Home Layout: One level Home Equipment: Walker - 2 wheels;Bedside commode      Prior Function Level of Independence: Independent         Comments: Pt Ind with amb without an AD with no fall history, Ind with ADLs     Hand Dominance        Extremity/Trunk Assessment   Upper Extremity Assessment Upper Extremity Assessment: Generalized weakness    Lower Extremity Assessment Lower Extremity Assessment: Generalized weakness    Cervical / Trunk Assessment Cervical / Trunk Assessment: Normal  Communication   Communication: No difficulties  Cognition Arousal/Alertness: Awake/alert Behavior During Therapy: WFL for tasks assessed/performed Overall Cognitive Status: Within Functional Limits for tasks assessed                                        General Comments      Exercises     Assessment/Plan    PT Assessment Patient needs continued PT services  PT Problem List Decreased strength;Decreased activity tolerance;Cardiopulmonary status limiting activity  PT Treatment Interventions Gait training;Stair training;Functional mobility training;Therapeutic activities;Therapeutic exercise;Patient/family education    PT Goals (Current goals can be found in the Care Plan section)  Acute Rehab PT Goals Patient Stated Goal: To feel better and get back home PT Goal Formulation: With patient Time For Goal Achievement: 11/15/19 Potential to Achieve Goals: Good    Frequency Min 2X/week   Barriers to discharge        Co-evaluation                AM-PAC PT "6 Clicks" Mobility  Outcome Measure Help needed turning from your back to your side while in a flat bed without using bedrails?: None Help needed moving from lying on your back to sitting on the side of a flat bed without using bedrails?: None Help needed moving to and from a bed to a chair (including a wheelchair)?: A Little Help needed standing up from a chair using your arms (e.g., wheelchair or bedside chair)?: A Little Help needed to walk in hospital room?: A Little Help needed climbing 3-5 steps with a railing? : A Little 6 Click Score: 20    End of Session Equipment Utilized During Treatment: Gait belt Activity Tolerance: Patient tolerated treatment well Patient left: in bed;with call bell/phone within reach;Other (comment)(MD arrived as I was leaving) Nurse Communication: Mobility status PT Visit Diagnosis: Muscle weakness (generalized) (M62.81)    Time: 3734-2876 PT Time Calculation (min) (ACUTE ONLY): 14 min   Charges:   PT Evaluation $PT Eval Moderate Complexity: 1 Mod PT Treatments $Gait Training: 8-22 mins        Shawntia Mangal, PT, GCS 11/07/19,12:17 PM

## 2019-11-08 DIAGNOSIS — I5043 Acute on chronic combined systolic (congestive) and diastolic (congestive) heart failure: Secondary | ICD-10-CM

## 2019-11-08 DIAGNOSIS — N179 Acute kidney failure, unspecified: Secondary | ICD-10-CM

## 2019-11-08 LAB — CBC
HCT: 28.1 % — ABNORMAL LOW (ref 36.0–46.0)
Hemoglobin: 9.2 g/dL — ABNORMAL LOW (ref 12.0–15.0)
MCH: 30.6 pg (ref 26.0–34.0)
MCHC: 32.7 g/dL (ref 30.0–36.0)
MCV: 93.4 fL (ref 80.0–100.0)
Platelets: 309 10*3/uL (ref 150–400)
RBC: 3.01 MIL/uL — ABNORMAL LOW (ref 3.87–5.11)
RDW: 18.5 % — ABNORMAL HIGH (ref 11.5–15.5)
WBC: 5.8 10*3/uL (ref 4.0–10.5)
nRBC: 1.2 % — ABNORMAL HIGH (ref 0.0–0.2)

## 2019-11-08 LAB — BASIC METABOLIC PANEL
Anion gap: 8 (ref 5–15)
BUN: 30 mg/dL — ABNORMAL HIGH (ref 8–23)
CO2: 25 mmol/L (ref 22–32)
Calcium: 8.9 mg/dL (ref 8.9–10.3)
Chloride: 106 mmol/L (ref 98–111)
Creatinine, Ser: 2.29 mg/dL — ABNORMAL HIGH (ref 0.44–1.00)
GFR calc Af Amer: 25 mL/min — ABNORMAL LOW (ref >60)
GFR calc non Af Amer: 22 mL/min — ABNORMAL LOW (ref >60)
Glucose, Bld: 86 mg/dL (ref 70–99)
Potassium: 3.6 mmol/L (ref 3.5–5.1)
Sodium: 139 mmol/L (ref 135–145)

## 2019-11-08 LAB — ECHOCARDIOGRAM COMPLETE
Height: 66 in
Weight: 1972.8 oz

## 2019-11-08 NOTE — Consult Note (Signed)
CARDIOLOGY CONSULT NOTE               Patient ID: Danielle [name] MRN: [id number] DOB/AGE: January 27, 1956 64 y.o.  Admit date: 11/06/2019 Referring Physician Merlyn Lot, MD emergency room Primary Physician [facility] Primary Cardiologist Serafina Royals MD,  Tyr[facility] Nine Reason for Consultation shortness of breath  HPI: Patient is a 64 year old female history of chronic systolic congestive heart failure with multiple readmissions Covid in December patient was on remdesivir and steroids patient has had renal insufficiency followed by nephrology with dyspnea fatigue dyspnea on minimal exertion elevated BNP abnormal chest x-ray head CT denies any chest pain has had multiple problems including COPD smoking heart failure lymphedema breast cancer status post XRT and radiation now presents with worsening dyspnea with possible pneumonitis from her recent Covid still hypoxic  Review of systems complete and found to be negative unless listed above     Past Medical History:  Diagnosis Date  . Anxiety   . Arthritis   . Breast cancer ([place]) 2000  . Cancer ([place]) left    breast cancer 2000, chemo tx's with total mastectomy and lymph nodes resected.   . Cancer of right lung ([place]) 02/21/2016   rad tx's.   . CHF (congestive heart failure) ([place])   . COPD (chronic obstructive pulmonary disease) ([place])   . Dependence on supplemental oxygen   . Depression   . Heart murmur   . Hypertension   . Lung nodule   . Lymphedema   . Personal history of chemotherapy   . Personal history of radiation therapy   . Shortness of breath dyspnea    with exertion  . Status post chemotherapy 2001   left breast cancer  . Status post radiation therapy 2001   left breast cancer    Past Surgical History:  Procedure Laterality Date  . Breast Biospy Left    [facility]  . BREAST SURGERY    . COLONOSCOPY N/A 04/30/2018   Procedure: COLONOSCOPY;  Surgeon: Virgel Manifold, MD;  Location: [facility] ENDOSCOPY;   Service: Endoscopy;  Laterality: N/A;  . COLONOSCOPY N/A 07/22/2018   Procedure: COLONOSCOPY;  Surgeon: Virgel Manifold, MD;  Location: [facility] ENDOSCOPY;  Service: Endoscopy;  Laterality: N/A;  . DILATION AND CURETTAGE OF UTERUS    . ELECTROMAGNETIC NAVIGATION BROCHOSCOPY Right 04/11/2016   Procedure: ELECTROMAGNETIC NAVIGATION BRONCHOSCOPY;  Surgeon: Vilinda Boehringer, MD;  Location: [facility] ORS;  Service: Cardiopulmonary;  Laterality: Right;  . ESOPHAGOGASTRODUODENOSCOPY N/A 07/22/2018   Procedure: ESOPHAGOGASTRODUODENOSCOPY (EGD);  Surgeon: Virgel Manifold, MD;  Location: [facility] ENDOSCOPY;  Service: Endoscopy;  Laterality: N/A;  . ESOPHAGOGASTRODUODENOSCOPY (EGD) WITH PROPOFOL N/A 05/07/2018   Procedure: ESOPHAGOGASTRODUODENOSCOPY (EGD) WITH PROPOFOL;  Surgeon: Lucilla Lame, MD;  Location: [facility] ENDOSCOPY;  Service: Endoscopy;  Laterality: N/A;  . ESOPHAGOGASTRODUODENOSCOPY (EGD) WITH PROPOFOL N/A 04/24/2019   Procedure: ESOPHAGOGASTRODUODENOSCOPY (EGD) WITH PROPOFOL;  Surgeon: Jonathon Bellows, MD;  Location: [facility] ENDOSCOPY;  Service: Gastroenterology;  Laterality: N/A;  . EUS N/A 05/07/2019   Procedure: FULL UPPER ENDOSCOPIC ULTRASOUND (EUS) RADIAL;  Surgeon: Jola Schmidt, MD;  Location: [facility] ENDOSCOPY;  Service: Endoscopy;  Laterality: N/A;  . history of colonoscopy]    . ILEOSCOPY N/A 07/22/2018   Procedure: ILEOSCOPY THROUGH STOMA;  Surgeon: Virgel Manifold, MD;  Location: [facility] ENDOSCOPY;  Service: Endoscopy;  Laterality: N/A;  . ILEOSTOMY    . ILEOSTOMY N/A 09/08/2018   Procedure: ILEOSTOMY REVISION POSSIBLE CREATION;  Surgeon: Herbert Pun, MD;  Location: [facility] ORS;  Service: General;  Laterality:  N/A;  . ILEOSTOMY CLOSURE N/A 08/15/2018   Procedure: DILATION OF ILEOSTOMY STRICTURE;  Surgeon: Herbert Pun, MD;  Location: [facility] ORS;  Service: General;  Laterality: N/A;  . LAPAROTOMY Right 05/04/2018   Procedure: EXPLORATORY LAPAROTOMY right colectomy right and left ostomy;   Surgeon: Herbert Pun, MD;  Location: [facility] ORS;  Service: General;  Laterality: Right;  . LUNG BIOPSY    . MASTECTOMY Left    2000, [facility]  . ROTATOR CUFF REPAIR Right    [facility]    Medications Prior to Admission  Medication Sig Dispense Refill Last Dose  . calcium-vitamin D (OSCAL WITH D) 250-125 MG-UNIT tablet Take 1 tablet by mouth daily.   11/06/2019 at 0830  . carvedilol (COREG) 3.125 MG tablet Take 3.125 mg by mouth 2 (two) times daily with a meal.   11/06/2019 at 0830  . citalopram (CELEXA) 20 MG tablet Take 40 mg by mouth daily.    11/06/2019 at 0830  . ferrous sulfate 325 (65 FE) MG tablet Take 1 tablet (325 mg total) by mouth 2 (two) times daily with a meal. 60 tablet 3 11/06/2019 at 0830  . Fluticasone-Umeclidin-Vilant 100-62.5-25 MCG/INH AEPB Inhale 1 puff into the lungs daily.    11/06/2019 at 0830  . Multiple Vitamin (MULTIVITAMIN WITH MINERALS) TABS tablet Take 1 tablet by mouth daily. 30 tablet 1 11/06/2019 at 0830  . pantoprazole (PROTONIX) 40 MG tablet Take 1 tablet (40 mg total) by mouth 2 (two) times daily before a meal. 60 tablet 0 11/06/2019 at 0830  . sodium bicarbonate 650 MG tablet Take 2 tablets (1,300 mg total) by mouth 3 (three) times daily. 180 tablet 0 11/06/2019 at 0830  . acetaminophen (TYLENOL) 325 MG tablet Take 2 tablets (650 mg total) by mouth every 6 (six) hours as needed for mild pain (or Fever >/= 101).   prn at prn  . albuterol (PROVENTIL HFA;VENTOLIN HFA) 108 (90 Base) MCG/ACT inhaler Inhale 2 puffs into the lungs every 6 (six) hours as needed for wheezing or shortness of breath. 1 Inhaler 2 prn at prn  . calcium carbonate (TUMS - DOSED IN MG ELEMENTAL CALCIUM) 500 MG chewable tablet Chew 0.5 tablets (100 mg of elemental calcium total) by mouth daily.   prn at prn  . furosemide (LASIX) 20 MG tablet Hold until follow up with kidney doctor. 40 tablet 5   . guaiFENesin-dextromethorphan (ROBITUSSIN DM) 100-10 MG/5ML syrup Take 5 mLs by mouth every 4 (four) hours  as needed for cough.   prn at prn  . loperamide (IMODIUM A-D) 2 MG tablet Take 1 tablet (2 mg total) by mouth as needed for diarrhea or loose stools. 30 tablet 0 prn at prn  . Nutritional Supplements (FEEDING SUPPLEMENT, NEPRO CARB STEADY,) LIQD Take 237 mLs by mouth 3 (three) times daily between meals. 21330 mL 0   . sucralfate (CARAFATE) 1 GM/10ML suspension Take 10 mLs (1 g total) by mouth 4 (four) times daily. 1200 mL 2 prn at prn   Social History   Socioeconomic History  . Marital status: Divorced    Spouse name: Not on file  . Number of children: 3  . Years of education: Not on file  . Highest education level: Not on file  Occupational History  . Occupation: Retired   Tobacco Use  . Smoking status: Former Smoker    Packs/day: 0.50    Years: 20.00    Pack years: 10.00    Types: Cigarettes    Quit date: 07/02/2012  Years since quitting: 7.3  . Smokeless tobacco: Current User    Types: Snuff  . Tobacco comment: quit 2014  Substance and Sexual Activity  . Alcohol use: Not Currently    Comment: Occasionally  . Drug use: No  . Sexual activity: Not Currently  Other Topics Concern  . Not on file  Social History Narrative  . Not on file   Social Determinants of Health   Financial Resource Strain:   . Difficulty of Paying Living Expenses: Not on file  Food Insecurity:   . Worried About Charity fundraiser in the Last Year: Not on file  . Ran Out of Food in the Last Year: Not on file  Transportation Needs:   . Lack of Transportation (Medical): Not on file  . Lack of Transportation (Non-Medical): Not on file  Physical Activity:   . Days of Exercise per Week: Not on file  . Minutes of Exercise per Session: Not on file  Stress:   . Feeling of Stress : Not on file  Social Connections:   . Frequency of Communication with Friends and Family: Not on file  . Frequency of Social Gatherings with Friends and Family: Not on file  . Attends Religious Services: Not on file  .  Active Member of Clubs or Organizations: Not on file  . Attends Archivist Meetings: Not on file  . Marital Status: Not on file  Intimate Partner Violence:   . Fear of Current or Ex-Partner: Not on file  . Emotionally Abused: Not on file  . Physically Abused: Not on file  . Sexually Abused: Not on file    Family History  Problem Relation Age of Onset  . Breast cancer Mother 65  . Cancer Mother        Breast   . Cirrhosis Father   . Breast cancer Paternal Aunt 24  . Cancer Maternal Aunt        Breast       Review of systems complete and found to be negative unless listed above      PHYSICAL EXAM  General: Well developed, well nourished, in no acute distress HEENT:  Normocephalic and atramatic Neck:  No JVD.  Lungs: Clear bilaterally to auscultation and percussion. Heart: HRRR . Normal S1 and S2 without gallops or murmurs.  Abdomen: Bowel sounds are positive, abdomen soft and non-tender  Msk:  Back normal, normal gait. Normal strength and tone for age. Extremities: No clubbing, cyanosis or edema.   Neuro: Alert and oriented X 3. Psych:  Good affect, responds appropriately  Labs:   Lab Results  Component Value Date   WBC 5.8 11/08/2019   HGB 9.2 (L) 11/08/2019   HCT 28.1 (L) 11/08/2019   MCV 93.4 11/08/2019   PLT 309 11/08/2019    Recent Labs  Lab 11/08/19 0449  NA 139  K 3.6  CL 106  CO2 25  BUN 30*  CREATININE 2.29*  CALCIUM 8.9  GLUCOSE 86   Lab Results  Component Value Date   CKTOTAL 207 06/26/2013   CKMB 1.9 06/27/2013   TROPONINI <0.03 12/24/2018   No results found for: CHOL No results found for: HDL No results found for: [facility] Lab Results  Component Value Date   TRIG 184 (H) 09/27/2019   TRIG 55 05/12/2018   TRIG 295 (H) 05/05/2018   No results found for: CHOLHDL No results found for: LDLDIRECT    Radiology: CT ABDOMEN PELVIS WO CONTRAST  Result Date: 10/31/2019 CLINICAL  DATA:  Abdominal cramping EXAM: CT ABDOMEN AND  PELVIS WITHOUT CONTRAST TECHNIQUE: Multidetector CT imaging of the abdomen and pelvis was performed following the standard protocol without IV contrast. COMPARISON:  10/29/2019 FINDINGS: Lower chest: Lung bases are clear. Heart size is normal. Coronary artery calcifications evident. Hepatobiliary: Stable size and appearance of small low-density lesions within the liver, which may reflect cysts versus hemangiomas. Gallbladder appears unremarkable. No gallstone. No biliary dilatation. Pancreas: Unremarkable. No pancreatic ductal dilatation or surrounding inflammatory changes. Spleen: Normal in size without focal abnormality. Adrenals/Urinary Tract: Adrenal glands are unremarkable. Kidneys are normal, without renal calculi, focal lesion, or hydronephrosis. Bladder is unremarkable. 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 Enteric contrast is present within small bowel loops and extending into the ileostomy. No evidence of bowel obstruction. Sigmoid diverticulosis. No focal bowel wall thickening or inflammatory changes are seen. Vascular/Lymphatic: Extensive atherosclerotic calcification of the abdominal aorta and its branches. No abdominopelvic lymphadenopathy. Reproductive: Uterus and bilateral adnexa are unremarkable. Other: No free fluid within the abdomen or pelvis. No pneumoperitoneum. Nodular focus within the subcutaneous soft tissues of the upper abdomen right of midline (series 2, image 11) and right gluteal region (series 2, image 30), which may reflect injection related changes. Musculoskeletal: Stable mild lumbar compression deformities. No new or acute osseous findings. IMPRESSION: 1. No acute findings in the abdomen or pelvis. 2. Extensive postsurgical changes of the bowel. No evidence of bowel obstruction. 3. Sigmoid diverticulosis without evidence of acute diverticulitis. 4. Extensive atherosclerosis. Electronically Signed   By:  Davina Poke D.O.   On: 10/31/2019 16:29   DG Chest 2 View  Result Date: 11/06/2019 CLINICAL DATA:  Chest pain. Shortness of breath. COVID-19 positive. EXAM: CHEST - 2 VIEW COMPARISON:  Chest x-ray 11/03/2019, 06/19/2019.  CT 11/19/2018. FINDINGS: Mediastinum and hilar structures normal. Stable changes of scarring right upper lung. Bibasilar interstitial prominence with nodularity. These changes may be related pneumonitis. Follow-up exam to demonstrate clearing suggested. New small bilateral pleural effusions, right side greater than left. No pneumothorax. IMPRESSION: 1. Bibasilar interstitial prominence with nodularity. These changes may be related to pneumonitis. Follow-up exams suggested to demonstrate resolution. 2. New small bilateral pleural effusions, right side greater than left. 3.   Persistent changes of scarring right upper lung. Electronically Signed   By: Marcello Moores  Register   On: 11/06/2019 13:04   CT Angio Chest PE W and/or Wo Contrast  Result Date: 11/06/2019 CLINICAL DATA:  Shortness of breath, recent COVID EXAM: CT ANGIOGRAPHY CHEST WITH CONTRAST TECHNIQUE: Multidetector CT imaging of the chest was performed using the standard protocol during bolus administration of intravenous contrast. Multiplanar CT image reconstructions and MIPs were obtained to evaluate the vascular anatomy. CONTRAST:  83mL OMNIPAQUE IOHEXOL 350 MG/ML SOLN COMPARISON:  11/19/2018 FINDINGS: Cardiovascular: Satisfactory opacification of the pulmonary arteries to the segmental level. No evidence of pulmonary embolism. Normal heart size. No pericardial effusion. Coronary artery calcification. Calcified plaque is present along the thoracic aorta. Mediastinum/Nodes: There is no mediastinal, hilar, or axillary adenopathy. Lungs/Pleura: Small to moderate right pleural effusion. Greater confluent appearance of anterior right upper lobe opacity. Similar appearance of contiguous more superior right upper lobe opacity. Mild  bibasilar atelectasis. Moderate emphysema. Upper Abdomen: No acute abnormality. Musculoskeletal: No acute abnormality. Review of the MIP images confirms the above findings. IMPRESSION: No acute pulmonary embolism. Small to moderate right pleural effusion. More confluent appearance of anterior right upper lobe treated mass. Emphysema and aortic atherosclerosis. Electronically  Signed   By: Macy Mis M.D.   On: 11/06/2019 15:50   DG Chest Port 1 View  Result Date: 11/03/2019 CLINICAL DATA:  Shortness of breath EXAM: PORTABLE CHEST 1 VIEW COMPARISON:  09/26/2019 FINDINGS: Cardiac shadow is within normal limits. Aortic calcifications are again seen. Diffuse prominent interstitial markings are again seen. Scarring in the right upper lobe is noted stable from the prior exam. Mild bibasilar opacities are noted right greater than left consistent with the given clinical history. No bony abnormality is noted. IMPRESSION: Bibasilar opacities consistent with the given clinical history of COVID-19 positivity. Chronic scarring in the right upper lobe. Electronically Signed   By: Inez Catalina M.D.   On: 11/03/2019 13:23   DG Abd Portable 1V  Result Date: 10/16/2019 CLINICAL DATA:  Generalized abdominal pain EXAM: PORTABLE ABDOMEN - 1 VIEW COMPARISON:  07/20/2018 FINDINGS: Stomach is distended with air. No obstructive changes are seen. No free air is noted. Diffuse aortic calcifications are noted without aneurysmal dilatation. Tubal ligation clips are seen. Degenerative changes of lumbar spine are noted. IMPRESSION: No acute abnormality seen. Electronically Signed   By: Inez Catalina M.D.   On: 10/16/2019 11:33   ECHOCARDIOGRAM COMPLETE  Result Date: 11/08/2019   ECHOCARDIOGRAM REPORT   Patient Name:   Danielle [name] [facility] Date of Exam: 11/07/2019 Medical Rec #:  [id number]       Height:       66.0 in Accession #:    [id number]      Weight:       123.3 lb Date of Birth:  11-22-1955       BSA:          1.63 m Patient Age:     25 years        BP:           120/76 mmHg Patient Gender: F               HR:           101 bpm. Exam Location:  [facility] Procedure: 2D Echo Indications:     DYSPNEA 786.09  History:         Patient has prior history of Echocardiogram examinations, most                  recent 04/21/2018. CHF, COPD, Signs/Symptoms:Syncope; Risk                  Factors:Hypertension.  Sonographer:     Avanell Shackleton Referring Phys:  [id number] VIPUL [facility] Diagnosing Phys: Yolonda Kida MD IMPRESSIONS  1. Left ventricular ejection fraction, by visual estimation, is 50 to 55%. The left ventricle has low normal function. Left ventricular septal wall thickness was mildly increased. Mildly increased left ventricular posterior wall thickness. There is mildly increased left ventricular hypertrophy.  2. Left ventricular diastolic parameters are consistent with Grade I diastolic dysfunction (impaired relaxation).  3. Small to moderate inferior myocardial infarction.  4. The left ventricle demonstrates regional wall motion abnormalities.  5. Global right ventricle has normal systolic function.The right ventricular size is normal. No increase in right ventricular wall thickness.  6. Left atrial size was normal.  7. Right atrial size was normal.  8. The mitral valve is normal in structure. No evidence of mitral valve regurgitation.  9. The tricuspid valve is normal in structure. 10. The tricuspid valve is normal in structure. Tricuspid valve regurgitation is mild. 11. The aortic valve is normal in structure. Aortic valve  regurgitation is not visualized. Mild aortic valve sclerosis without stenosis. 12. The pulmonic valve was grossly normal. Pulmonic valve regurgitation is not visualized. 13. Moderately elevated pulmonary artery systolic pressure. FINDINGS  Left Ventricle: Left ventricular ejection fraction, by visual estimation, is 50 to 55%. The left ventricle has low normal function. The left ventricle demonstrates regional wall motion abnormalities.  Mildly increased left ventricular posterior wall thickness. There is mildly increased left ventricular hypertrophy. Concentric left ventricular hypertrophy. Left ventricular diastolic parameters are consistent with Grade I diastolic dysfunction (impaired relaxation). There is evidence of a small to moderate inferior myocardial infarction. Right Ventricle: The right ventricular size is normal. No increase in right ventricular wall thickness. Global RV systolic function is has normal systolic function. The tricuspid regurgitant velocity is 2.82 m/s, and with an assumed right atrial pressure  of 10 mmHg, the estimated right ventricular systolic pressure is moderately elevated at 41.8 mmHg. Left Atrium: Left atrial size was normal in size. Right Atrium: Right atrial size was normal in size Pericardium: There is no evidence of pericardial effusion. Mitral Valve: The mitral valve is normal in structure. No evidence of mitral valve regurgitation. Tricuspid Valve: The tricuspid valve is normal in structure. Tricuspid valve regurgitation is mild. Aortic Valve: The aortic valve is normal in structure. Aortic valve regurgitation is not visualized. Mild aortic valve sclerosis is present, with no evidence of aortic valve stenosis. Aortic valve mean gradient measures 10.0 mmHg. Aortic valve peak gradient measures 17.7 mmHg. Aortic valve area, by VTI measures 1.14 cm. Pulmonic Valve: The pulmonic valve was grossly normal. Pulmonic valve regurgitation is not visualized. Pulmonic regurgitation is not visualized. Aorta: The aortic root is normal in size and structure. IAS/Shunts: No atrial level shunt detected by color flow Doppler.  LEFT VENTRICLE PLAX 2D LVIDd:         3.51 cm  Diastology LVIDs:         2.54 cm  LV e' lateral:   9.36 cm/s LV PW:         1.15 cm  LV E/e' lateral: 6.3 LV IVS:        0.96 cm  LV e' medial:    5.55 cm/s LVOT diam:     1.90 cm  LV E/e' medial:  10.6 LV SV:         28 ml LV SV Index:   17.40 LVOT Area:      2.84 cm  RIGHT VENTRICLE             IVC RV S prime:     11.30 cm/s  IVC diam: 1.50 cm LEFT ATRIUM             Index       RIGHT ATRIUM           Index LA diam:        4.30 cm 2.64 cm/m  RA Area:     13.40 cm LA Vol (A2C):   49.1 ml 30.16 ml/m RA Volume:   31.10 ml  19.10 ml/m LA Vol (A4C):   35.0 ml 21.50 ml/m LA Biplane Vol: 41.9 ml 25.73 ml/m  AORTIC VALVE AV Area (Vmax):    1.05 cm AV Area (Vmean):   1.03 cm AV Area (VTI):     1.14 cm AV Vmax:           210.20 cm/s AV Vmean:          147.600 cm/s AV VTI:  0.412 m AV Peak Grad:      17.7 mmHg AV Mean Grad:      10.0 mmHg LVOT Vmax:         77.60 cm/s LVOT Vmean:        53.800 cm/s LVOT VTI:          0.166 m LVOT/AV VTI ratio: 0.40  AORTA Ao Root diam: 3.20 cm MITRAL VALVE                        TRICUSPID VALVE MV Area (PHT): 3.72 cm             TR Peak grad:   31.8 mmHg MV PHT:        59.16 msec           TR Vmax:        309.00 cm/s MV Decel Time: 204 msec MV E velocity: 58.70 cm/s 103 cm/s  SHUNTS MV A velocity: 66.90 cm/s 70.3 cm/s Systemic VTI:  0.17 m MV E/A ratio:  0.88       1.5       Systemic Diam: 1.90 cm  Yolonda Kida MD Electronically signed by Yolonda Kida MD Signature Date/Time: 11/08/2019/9:35:56 AM    Final    CT Renal Stone Study  Result Date: 10/29/2019 CLINICAL DATA:  Acute renal failure. Right-sided back pain and dizziness. Lung cancer and breast cancer. EXAM: CT ABDOMEN AND PELVIS WITHOUT CONTRAST TECHNIQUE: Multidetector CT imaging of the abdomen and pelvis was performed following the standard protocol without IV contrast. COMPARISON:  09/27/2019 renal ultrasound. Abdominopelvic CT of 04/29/2019 FINDINGS: Lower chest: Clear lung bases. Normal heart size without pericardial or pleural effusion. Multivessel coronary artery atherosclerosis. Hepatobiliary: Hepatic cysts. Normal gallbladder, without biliary ductal dilatation. Pancreas: Mild pancreatic atrophy, without duct dilatation or acute inflammation.  Spleen: Normal in size, without focal abnormality. Adrenals/Urinary Tract: Normal adrenal glands. No renal calculi or hydronephrosis. No hydroureter or ureteric calculi. No bladder calculi. Stomach/Bowel: Normal stomach, without wall thickening. Moderate amount of stool within the rectum. Scattered colonic diverticula. Left abdominal mucous fistula. Right abdominal end colostomy. Normal terminal ileum. Normal small bowel caliber. Vascular/Lymphatic: Advanced aortic and branch vessel atherosclerosis. No abdominopelvic adenopathy. Reproductive: Normal uterus and adnexa. Other: No significant free fluid. Mild pelvic floor laxity. No evidence of omental or peritoneal disease. Nonspecific subcutaneous nodule about the anterior right abdominal wall at 8 mm on 14/2, new. There is also subcutaneous nodule superficial the right gluteals of 11 mm on 29/2, new. Musculoskeletal: Mild osteopenia. L3 and L5 mild superior endplate compression deformities, similar to on the prior. This presumes a transitional S1 vertebral body. IMPRESSION: 1.  No urinary tract calculi or hydronephrosis. 2. Otherwise, low sensitivity exam secondary to stone study technique. 3. L3 and L5 mild superior endplate compression deformities, chronic. 4. Extensive surgical changes within the colon, without acute complication. 5. Coronary artery atherosclerosis. Aortic Atherosclerosis (ICD10-I70.0). 6. New subcutaneous nodularity about the abdomen and right gluteal region. Correlate with sites of injections and consider physical exam. Electronically Signed   By: Abigail Miyamoto M.D.   On: 10/29/2019 14:06    EKG: Normal sinus rhythm nonspecific ST-T wave changes  ASSESSMENT AND PLAN:  Shortness of breath History of smoking COPD Acute on chronic systolic congestive heart failure Hypoxemia Chronic renal insufficiency Hypertension Status post Covid 19 pneumonia Possible post pneumonia pneumonitis Breast cancer Status post chemo  XRT Lymphedema Atrial fibrillation . Plan Agree with admit to telemetry  Continue EKGs troponins Recommend supplemental oxygen therapy for hypoxemia Inhalers for COPD asthma Diuretic therapy for heart failure continue diuretic therapy for heart failure Continue Coreg therapy for hypertension and heart failure Recommend ACE ARB or Entresto for heart failure Consider adding spironolactone for heart failure management Recommend pulmonary input for possible post Covid pneumonitis Recommend conservative input  Signed: Yolonda Kida MD 11/08/2019, 10:40 AM

## 2019-11-08 NOTE — Progress Notes (Signed)
[place], [place] 11/08/19  Subjective:  Patient with multiple recent hospitalizations. Came back in with increasing shortness of breath and lower extremity edema. Patient started on Lasix. Creatinine went from 1.86 up to 2.29. In addition patient received contrast upon admission with CT scan.  Objective:  Vital signs in last 24 hours:  Temp:  [97.8 F (36.6 C)-99 F (37.2 C)] 97.8 F (36.6 C) (01/24 0738) Pulse Rate:  [71-92] 71 (01/24 0738) Resp:  [16-18] 17 (01/24 0738) BP: (92-113)/(61-77) 108/76 (01/24 0738) SpO2:  [96 %-98 %] 98 % (01/24 0738) Weight:  [54.9 kg] 54.9 kg (01/24 0340)  Weight change: 0.045 kg Filed Weights   11/06/19 1825 11/07/19 0400 11/08/19 0340  Weight: 56.3 kg 55.9 kg 54.9 kg    Intake/Output:    Intake/Output Summary (Last 24 hours) at 11/08/2019 1249 Last data filed at 11/08/2019 0908 Gross per 24 hour  Intake 290 ml  Output 100 ml  Net 190 ml     Physical Exam: General:  Sitting up in bed.  HEENT  moist oral mucous membranes  Pulm/lungs  minimal basilar rales  CVS/Heart  irregular rhythm  Abdomen:   Soft, nontender, ostomy in place with liquid stool  Extremities:  No peripheral edema  Neurologic:  Alert, able to follow some commands  Skin:  No acute rashes    Basic Metabolic Panel:  Recent Labs  Lab 11/03/19 0610 11/03/19 0610 11/04/19 0432 11/04/19 0432 11/06/19 1205 11/07/19 0622 11/08/19 0449  NA 141  --  141  --  142 142 139  K 4.7  --  4.7  --  4.6 3.8 3.6  CL 119*  --  120*  --  116* 111 106  CO2 15*  --  16*  --  18* 21* 25  GLUCOSE 80  --  83  --  84 75 86  BUN 43*  --  38*  --  24* 24* 30*  CREATININE 2.35*  --  1.86*  --  1.84* 1.86* 2.29*  CALCIUM 8.2*   < > 8.3*   < > 9.1 9.1 8.9   < > = values in this interval not displayed.     CBC: Recent Labs  Lab 11/02/19 0412 11/03/19 0610 11/04/19 0432 11/06/19 1205 11/08/19 0449  WBC 5.2 6.2 5.1 5.8 5.8  HGB 7.1* 7.3* 8.9*  9.8* 9.2*  HCT 21.8* 22.9* 28.0* 31.1* 28.1*  MCV 97.3 99.1 94.6 94.0 93.4  PLT 283 287 286 302 309     No results found for: HEPBSAG, HEPBSAB, HEPBIGM    Microbiology:  Recent Results (from the past 240 hour(s))  C difficile quick scan w PCR reflex     Status: None   Collection Time: 10/31/19 12:21 PM   Specimen: Abdomen; Stool  Result Value Ref Range Status   C Diff antigen NEGATIVE NEGATIVE Final   C Diff toxin NEGATIVE NEGATIVE Final   C Diff interpretation No C. difficile detected.  Final    Comment: Performed at [facility], [place], [place] [address]  SARS CORONAVIRUS 2 (TAT 6-24 HRS) Nasopharyngeal Nasopharyngeal Swab     Status: None   Collection Time: 11/06/19  4:50 PM   Specimen: Nasopharyngeal Swab  Result Value Ref Range Status   SARS Coronavirus 2 NEGATIVE NEGATIVE Final    Comment: (NOTE) SARS-CoV-2 target nucleic acids are NOT DETECTED. The SARS-CoV-2 RNA is generally detectable in upper and lower respiratory specimens during the acute phase of infection. Negative  results do not preclude SARS-CoV-2 infection, do not rule out co-infections with other pathogens, and should not be used as the sole basis for treatment or other patient management decisions. Negative results must be combined with clinical observations, patient history, and epidemiological information. The expected result is Negative. Fact Sheet for Patients: SugarRoll.be Fact Sheet for Healthcare Providers: https://www.woods-mathews.com/ This test is not yet approved or cleared by the [place] FDA and  has been authorized for detection and/or diagnosis of SARS-CoV-2 by FDA under an Emergency Use Authorization (EUA). This EUA will remain  in effect (meaning this test can be used) for the duration of the COVID-19 declaration under Section 56 4(b)(1) of the Act, 21 U.S.C. section 360bbb-3(b)(1), unless the authorization is  terminated or revoked sooner. Performed at [facility], [place] [address], [place], [place] [address]     Coagulation Studies: No results for input(s): LABPROT, INR in the last 72 hours.  Urinalysis: No results for input(s): COLORURINE, LABSPEC, PHURINE, GLUCOSEU, HGBUR, BILIRUBINUR, KETONESUR, PROTEINUR, UROBILINOGEN, NITRITE, LEUKOCYTESUR in the last 72 hours.  Invalid input(s): APPERANCEUR    Imaging: DG Chest 2 View  Result Date: 11/06/2019 CLINICAL DATA:  Chest pain. Shortness of breath. COVID-19 positive. EXAM: CHEST - 2 VIEW COMPARISON:  Chest x-ray 11/03/2019, 06/19/2019.  CT 11/19/2018. FINDINGS: Mediastinum and hilar structures normal. Stable changes of scarring right upper lung. Bibasilar interstitial prominence with nodularity. These changes may be related pneumonitis. Follow-up exam to demonstrate clearing suggested. New small bilateral pleural effusions, right side greater than left. No pneumothorax. IMPRESSION: 1. Bibasilar interstitial prominence with nodularity. These changes may be related to pneumonitis. Follow-up exams suggested to demonstrate resolution. 2. New small bilateral pleural effusions, right side greater than left. 3.   Persistent changes of scarring right upper lung. Electronically Signed   By: Marcello Moores  Register   On: 11/06/2019 13:04   CT Angio Chest PE W and/or Wo Contrast  Result Date: 11/06/2019 CLINICAL DATA:  Shortness of breath, recent COVID EXAM: CT ANGIOGRAPHY CHEST WITH CONTRAST TECHNIQUE: Multidetector CT imaging of the chest was performed using the standard protocol during bolus administration of intravenous contrast. Multiplanar CT image reconstructions and MIPs were obtained to evaluate the vascular anatomy. CONTRAST:  77mL OMNIPAQUE IOHEXOL 350 MG/ML SOLN COMPARISON:  11/19/2018 FINDINGS: Cardiovascular: Satisfactory opacification of the pulmonary arteries to the segmental level. No evidence of pulmonary embolism. Normal heart size. No  pericardial effusion. Coronary artery calcification. Calcified plaque is present along the thoracic aorta. Mediastinum/Nodes: There is no mediastinal, hilar, or axillary adenopathy. Lungs/Pleura: Small to moderate right pleural effusion. Greater confluent appearance of anterior right upper lobe opacity. Similar appearance of contiguous more superior right upper lobe opacity. Mild bibasilar atelectasis. Moderate emphysema. Upper Abdomen: No acute abnormality. Musculoskeletal: No acute abnormality. Review of the MIP images confirms the above findings. IMPRESSION: No acute pulmonary embolism. Small to moderate right pleural effusion. More confluent appearance of anterior right upper lobe treated mass. Emphysema and aortic atherosclerosis. Electronically Signed   By: Macy Mis M.D.   On: 11/06/2019 15:50   ECHOCARDIOGRAM COMPLETE  Result Date: 11/08/2019   ECHOCARDIOGRAM REPORT   Patient Name:   Danielle [name] [facility] Date of Exam: 11/07/2019 Medical Rec #:  [id number]       Height:       66.0 in Accession #:    [id number]      Weight:       123.3 lb Date of Birth:  Feb 15, 1956       BSA:  1.63 m Patient Age:    71 years        BP:           120/76 mmHg Patient Gender: F               HR:           101 bpm. Exam Location:  [facility] Procedure: 2D Echo Indications:     DYSPNEA 786.09  History:         Patient has prior history of Echocardiogram examinations, most                  recent 04/21/2018. CHF, COPD, Signs/Symptoms:Syncope; Risk                  Factors:Hypertension.  Sonographer:     Avanell Shackleton Referring Phys:  [id number] VIPUL [facility] Diagnosing Phys: Yolonda Kida MD IMPRESSIONS  1. Left ventricular ejection fraction, by visual estimation, is 50 to 55%. The left ventricle has low normal function. Left ventricular septal wall thickness was mildly increased. Mildly increased left ventricular posterior wall thickness. There is mildly increased left ventricular hypertrophy.  2. Left ventricular diastolic  parameters are consistent with Grade I diastolic dysfunction (impaired relaxation).  3. Small to moderate inferior myocardial infarction.  4. The left ventricle demonstrates regional wall motion abnormalities.  5. Global right ventricle has normal systolic function.The right ventricular size is normal. No increase in right ventricular wall thickness.  6. Left atrial size was normal.  7. Right atrial size was normal.  8. The mitral valve is normal in structure. No evidence of mitral valve regurgitation.  9. The tricuspid valve is normal in structure. 10. The tricuspid valve is normal in structure. Tricuspid valve regurgitation is mild. 11. The aortic valve is normal in structure. Aortic valve regurgitation is not visualized. Mild aortic valve sclerosis without stenosis. 12. The pulmonic valve was grossly normal. Pulmonic valve regurgitation is not visualized. 13. Moderately elevated pulmonary artery systolic pressure. FINDINGS  Left Ventricle: Left ventricular ejection fraction, by visual estimation, is 50 to 55%. The left ventricle has low normal function. The left ventricle demonstrates regional wall motion abnormalities. Mildly increased left ventricular posterior wall thickness. There is mildly increased left ventricular hypertrophy. Concentric left ventricular hypertrophy. Left ventricular diastolic parameters are consistent with Grade I diastolic dysfunction (impaired relaxation). There is evidence of a small to moderate inferior myocardial infarction. Right Ventricle: The right ventricular size is normal. No increase in right ventricular wall thickness. Global RV systolic function is has normal systolic function. The tricuspid regurgitant velocity is 2.82 m/s, and with an assumed right atrial pressure  of 10 mmHg, the estimated right ventricular systolic pressure is moderately elevated at 41.8 mmHg. Left Atrium: Left atrial size was normal in size. Right Atrium: Right atrial size was normal in size  Pericardium: There is no evidence of pericardial effusion. Mitral Valve: The mitral valve is normal in structure. No evidence of mitral valve regurgitation. Tricuspid Valve: The tricuspid valve is normal in structure. Tricuspid valve regurgitation is mild. Aortic Valve: The aortic valve is normal in structure. Aortic valve regurgitation is not visualized. Mild aortic valve sclerosis is present, with no evidence of aortic valve stenosis. Aortic valve mean gradient measures 10.0 mmHg. Aortic valve peak gradient measures 17.7 mmHg. Aortic valve area, by VTI measures 1.14 cm. Pulmonic Valve: The pulmonic valve was grossly normal. Pulmonic valve regurgitation is not visualized. Pulmonic regurgitation is not visualized. Aorta: The aortic root is normal in size  and structure. IAS/Shunts: No atrial level shunt detected by color flow Doppler.  LEFT VENTRICLE PLAX 2D LVIDd:         3.51 cm  Diastology LVIDs:         2.54 cm  LV e' lateral:   9.36 cm/s LV PW:         1.15 cm  LV E/e' lateral: 6.3 LV IVS:        0.96 cm  LV e' medial:    5.55 cm/s LVOT diam:     1.90 cm  LV E/e' medial:  10.6 LV SV:         28 ml LV SV Index:   17.40 LVOT Area:     2.84 cm  RIGHT VENTRICLE             IVC RV S prime:     11.30 cm/s  IVC diam: 1.50 cm LEFT ATRIUM             Index       RIGHT ATRIUM           Index LA diam:        4.30 cm 2.64 cm/m  RA Area:     13.40 cm LA Vol (A2C):   49.1 ml 30.16 ml/m RA Volume:   31.10 ml  19.10 ml/m LA Vol (A4C):   35.0 ml 21.50 ml/m LA Biplane Vol: 41.9 ml 25.73 ml/m  AORTIC VALVE AV Area (Vmax):    1.05 cm AV Area (Vmean):   1.03 cm AV Area (VTI):     1.14 cm AV Vmax:           210.20 cm/s AV Vmean:          147.600 cm/s AV VTI:            0.412 m AV Peak Grad:      17.7 mmHg AV Mean Grad:      10.0 mmHg LVOT Vmax:         77.60 cm/s LVOT Vmean:        53.800 cm/s LVOT VTI:          0.166 m LVOT/AV VTI ratio: 0.40  AORTA Ao Root diam: 3.20 cm MITRAL VALVE                        TRICUSPID VALVE  MV Area (PHT): 3.72 cm             TR Peak grad:   31.8 mmHg MV PHT:        59.16 msec           TR Vmax:        309.00 cm/s MV Decel Time: 204 msec MV E velocity: 58.70 cm/s 103 cm/s  SHUNTS MV A velocity: 66.90 cm/s 70.3 cm/s Systemic VTI:  0.17 m MV E/A ratio:  0.88       1.5       Systemic Diam: 1.90 cm  Dwayne Prince Rome MD Electronically signed by Yolonda Kida MD Signature Date/Time: 11/08/2019/9:35:56 AM    Final      Medications:   . sodium chloride     . calcium carbonate  0.5 tablet Oral Daily  . calcium-vitamin D  1 tablet Oral Daily  . carvedilol  3.125 mg Oral BID WC  . citalopram  40 mg Oral Daily  . enoxaparin (LOVENOX) injection  30 mg Subcutaneous Q24H  . feeding supplement (NEPRO CARB STEADY)  237 mL Oral TID BM  . ferrous sulfate  325 mg Oral BID WC  . fluticasone furoate-vilanterol  1 puff Inhalation Daily   And  . umeclidinium bromide  1 puff Inhalation Daily  . furosemide  40 mg Intravenous BID  . multivitamin with minerals  1 tablet Oral Daily  . pantoprazole  40 mg Oral BID AC  . sodium bicarbonate  1,300 mg Oral TID  . sodium chloride flush  3 mL Intravenous Q12H   sodium chloride, acetaminophen, albuterol, guaiFENesin-dextromethorphan, loperamide, ondansetron (ZOFRAN) IV, sodium chloride flush, sucralfate  Assessment/ Plan:  64 y.o. female with COPD, chronic systolic CHF, hypertension, anemia, GERD, depression, history of breast cancer treated with mastectomy, chemo, radiation;  lung cancer, history of colonic stricture status post hemi-colectomy with ostomy, lower esophageal cancer 2020 ongoing radiation admitted on 11/06/2019 for Shortness of breath [O71.21] Chronic systolic heart failure (HCC) [I50.22] CHF exacerbation ([place]) [I50.9] Acute on chronic congestive heart failure, unspecified heart failure type ([place]) [I50.9]  #Acute renal failure with history of M spike. Current episode of acute kidney injury likely related to contrast exposure as well as  IV Lasix.  At the last visit she was found to have an M spike but given improving renal function renal biopsy was not recommended.  We will hold further Lasix as her lower extremity edema has now resolved.   #Chronic kidney disease stage IIIb Lab Results  Component Value Date   CREATININE 2.29 (H) 11/08/2019   Baseline creatinine of 1.7, GFR 36 from October 21, 2019 Underlying CKD is likely secondary to hypertension, atherosclerosis 11/19/2019 Office Visit Nephrology Anthonette Legato, MD      #Anemia of CKD. Hemoglobin 9.2 at last check.  Hold off on Epogen for now.    LOS: 2 Saloni Lablanc 1/24/202112:49 PM  [place], [place]  Note: This note was prepared with Dragon dictation. Any transcription errors are unintentional

## 2019-11-08 NOTE — Progress Notes (Signed)
[facility] Cardiology    SUBJECTIVE: Still with shortness of breath dyspnea somewhat improved still on Lasix therapy for fluid dyspnea.  Patient still has significant renal insufficiency followed by nephrology as an outpatient denies any chest pain still receiving inhalers no fever chills or sweats   Vitals:   11/07/19 1558 11/07/19 1917 11/08/19 0340 11/08/19 0738  BP: 93/61 92/68 113/77 108/76  Pulse: 92 88 84 71  Resp: 18 18 16 17   Temp: 98.5 F (36.9 C) 99 F (37.2 C) 99 F (37.2 C) 97.8 F (36.6 C)  TempSrc: Oral Oral Oral Oral  SpO2: 98% 98% 96% 98%  Weight:   54.9 kg   Height:         Intake/Output Summary (Last 24 hours) at 11/08/2019 1058 Last data filed at 11/08/2019 0908 Gross per 24 hour  Intake 290 ml  Output 100 ml  Net 190 ml      PHYSICAL EXAM  General: Well developed, well nourished, in no acute distress HEENT:  Normocephalic and atramatic Neck:  No JVD.  Lungs: Clear bilaterally to auscultation and percussion. Heart: HRRR . Normal S1 and S2 without gallops or murmurs.  Abdomen: Bowel sounds are positive, abdomen soft and non-tender  Msk:  Back normal, normal gait. Normal strength and tone for age. Extremities: No clubbing, cyanosis or edema.   Neuro: Alert and oriented X 3. Psych:  Good affect, responds appropriately   LABS: Basic Metabolic Panel: Recent Labs    11/07/19 0622 11/08/19 0449  NA 142 139  K 3.8 3.6  CL 111 106  CO2 21* 25  GLUCOSE 75 86  BUN 24* 30*  CREATININE 1.86* 2.29*  CALCIUM 9.1 8.9   Liver Function Tests: No results for input(s): AST, ALT, ALKPHOS, BILITOT, PROT, ALBUMIN in the last 72 hours. No results for input(s): LIPASE, AMYLASE in the last 72 hours. CBC: Recent Labs    11/06/19 1205 11/08/19 0449  WBC 5.8 5.8  HGB 9.8* 9.2*  HCT 31.1* 28.1*  MCV 94.0 93.4  PLT 302 309   Cardiac Enzymes: No results for input(s): CKTOTAL, CKMB, CKMBINDEX, TROPONINI in the last 72 hours. BNP: Invalid input(s):  POCBNP D-Dimer: No results for input(s): DDIMER in the last 72 hours. Hemoglobin A1C: No results for input(s): HGBA1C in the last 72 hours. Fasting Lipid Panel: No results for input(s): CHOL, HDL, LDLCALC, TRIG, CHOLHDL, LDLDIRECT in the last 72 hours. Thyroid Function Tests: No results for input(s): TSH, T4TOTAL, T3FREE, THYROIDAB in the last 72 hours.  Invalid input(s): FREET3 Anemia Panel: No results for input(s): VITAMINB12, FOLATE, FERRITIN, TIBC, IRON, RETICCTPCT in the last 72 hours.  DG Chest 2 View  Result Date: 11/06/2019 CLINICAL DATA:  Chest pain. Shortness of breath. COVID-19 positive. EXAM: CHEST - 2 VIEW COMPARISON:  Chest x-ray 11/03/2019, 06/19/2019.  CT 11/19/2018. FINDINGS: Mediastinum and hilar structures normal. Stable changes of scarring right upper lung. Bibasilar interstitial prominence with nodularity. These changes may be related pneumonitis. Follow-up exam to demonstrate clearing suggested. New small bilateral pleural effusions, right side greater than left. No pneumothorax. IMPRESSION: 1. Bibasilar interstitial prominence with nodularity. These changes may be related to pneumonitis. Follow-up exams suggested to demonstrate resolution. 2. New small bilateral pleural effusions, right side greater than left. 3.   Persistent changes of scarring right upper lung. Electronically Signed   By: Marcello Moores  Register   On: 11/06/2019 13:04   CT Angio Chest PE W and/or Wo Contrast  Result Date: 11/06/2019 CLINICAL DATA:  Shortness of breath, recent  COVID EXAM: CT ANGIOGRAPHY CHEST WITH CONTRAST TECHNIQUE: Multidetector CT imaging of the chest was performed using the standard protocol during bolus administration of intravenous contrast. Multiplanar CT image reconstructions and MIPs were obtained to evaluate the vascular anatomy. CONTRAST:  67mL OMNIPAQUE IOHEXOL 350 MG/ML SOLN COMPARISON:  11/19/2018 FINDINGS: Cardiovascular: Satisfactory opacification of the pulmonary arteries to the  segmental level. No evidence of pulmonary embolism. Normal heart size. No pericardial effusion. Coronary artery calcification. Calcified plaque is present along the thoracic aorta. Mediastinum/Nodes: There is no mediastinal, hilar, or axillary adenopathy. Lungs/Pleura: Small to moderate right pleural effusion. Greater confluent appearance of anterior right upper lobe opacity. Similar appearance of contiguous more superior right upper lobe opacity. Mild bibasilar atelectasis. Moderate emphysema. Upper Abdomen: No acute abnormality. Musculoskeletal: No acute abnormality. Review of the MIP images confirms the above findings. IMPRESSION: No acute pulmonary embolism. Small to moderate right pleural effusion. More confluent appearance of anterior right upper lobe treated mass. Emphysema and aortic atherosclerosis. Electronically Signed   By: Macy Mis M.D.   On: 11/06/2019 15:50   ECHOCARDIOGRAM COMPLETE  Result Date: 11/08/2019   ECHOCARDIOGRAM REPORT   Patient Name:   Danielle [name] [facility] Date of Exam: 11/07/2019 Medical Rec #:  [id number]       Height:       66.0 in Accession #:    [id number]      Weight:       123.3 lb Date of Birth:  02-13-1956       BSA:          1.63 m Patient Age:    64 years        BP:           120/76 mmHg Patient Gender: F               HR:           101 bpm. Exam Location:  [facility] Procedure: 2D Echo Indications:     DYSPNEA 786.09  History:         Patient has prior history of Echocardiogram examinations, most                  recent 04/21/2018. CHF, COPD, Signs/Symptoms:Syncope; Risk                  Factors:Hypertension.  Sonographer:     Avanell Shackleton Referring Phys:  [id number] VIPUL [facility] Diagnosing Phys: Yolonda Kida MD IMPRESSIONS  1. Left ventricular ejection fraction, by visual estimation, is 50 to 55%. The left ventricle has low normal function. Left ventricular septal wall thickness was mildly increased. Mildly increased left ventricular posterior wall thickness. There is mildly  increased left ventricular hypertrophy.  2. Left ventricular diastolic parameters are consistent with Grade I diastolic dysfunction (impaired relaxation).  3. Small to moderate inferior myocardial infarction.  4. The left ventricle demonstrates regional wall motion abnormalities.  5. Global right ventricle has normal systolic function.The right ventricular size is normal. No increase in right ventricular wall thickness.  6. Left atrial size was normal.  7. Right atrial size was normal.  8. The mitral valve is normal in structure. No evidence of mitral valve regurgitation.  9. The tricuspid valve is normal in structure. 10. The tricuspid valve is normal in structure. Tricuspid valve regurgitation is mild. 11. The aortic valve is normal in structure. Aortic valve regurgitation is not visualized. Mild aortic valve sclerosis without stenosis. 12. The pulmonic valve was grossly normal. Pulmonic valve regurgitation is  not visualized. 13. Moderately elevated pulmonary artery systolic pressure. FINDINGS  Left Ventricle: Left ventricular ejection fraction, by visual estimation, is 50 to 55%. The left ventricle has low normal function. The left ventricle demonstrates regional wall motion abnormalities. Mildly increased left ventricular posterior wall thickness. There is mildly increased left ventricular hypertrophy. Concentric left ventricular hypertrophy. Left ventricular diastolic parameters are consistent with Grade I diastolic dysfunction (impaired relaxation). There is evidence of a small to moderate inferior myocardial infarction. Right Ventricle: The right ventricular size is normal. No increase in right ventricular wall thickness. Global RV systolic function is has normal systolic function. The tricuspid regurgitant velocity is 2.82 m/s, and with an assumed right atrial pressure  of 10 mmHg, the estimated right ventricular systolic pressure is moderately elevated at 41.8 mmHg. Left Atrium: Left atrial size was normal  in size. Right Atrium: Right atrial size was normal in size Pericardium: There is no evidence of pericardial effusion. Mitral Valve: The mitral valve is normal in structure. No evidence of mitral valve regurgitation. Tricuspid Valve: The tricuspid valve is normal in structure. Tricuspid valve regurgitation is mild. Aortic Valve: The aortic valve is normal in structure. Aortic valve regurgitation is not visualized. Mild aortic valve sclerosis is present, with no evidence of aortic valve stenosis. Aortic valve mean gradient measures 10.0 mmHg. Aortic valve peak gradient measures 17.7 mmHg. Aortic valve area, by VTI measures 1.14 cm. Pulmonic Valve: The pulmonic valve was grossly normal. Pulmonic valve regurgitation is not visualized. Pulmonic regurgitation is not visualized. Aorta: The aortic root is normal in size and structure. IAS/Shunts: No atrial level shunt detected by color flow Doppler.  LEFT VENTRICLE PLAX 2D LVIDd:         3.51 cm  Diastology LVIDs:         2.54 cm  LV e' lateral:   9.36 cm/s LV PW:         1.15 cm  LV E/e' lateral: 6.3 LV IVS:        0.96 cm  LV e' medial:    5.55 cm/s LVOT diam:     1.90 cm  LV E/e' medial:  10.6 LV SV:         28 ml LV SV Index:   17.40 LVOT Area:     2.84 cm  RIGHT VENTRICLE             IVC RV S prime:     11.30 cm/s  IVC diam: 1.50 cm LEFT ATRIUM             Index       RIGHT ATRIUM           Index LA diam:        4.30 cm 2.64 cm/m  RA Area:     13.40 cm LA Vol (A2C):   49.1 ml 30.16 ml/m RA Volume:   31.10 ml  19.10 ml/m LA Vol (A4C):   35.0 ml 21.50 ml/m LA Biplane Vol: 41.9 ml 25.73 ml/m  AORTIC VALVE AV Area (Vmax):    1.05 cm AV Area (Vmean):   1.03 cm AV Area (VTI):     1.14 cm AV Vmax:           210.20 cm/s AV Vmean:          147.600 cm/s AV VTI:            0.412 m AV Peak Grad:      17.7 mmHg AV Mean Grad:  10.0 mmHg LVOT Vmax:         77.60 cm/s LVOT Vmean:        53.800 cm/s LVOT VTI:          0.166 m LVOT/AV VTI ratio: 0.40  AORTA Ao Root diam:  3.20 cm MITRAL VALVE                        TRICUSPID VALVE MV Area (PHT): 3.72 cm             TR Peak grad:   31.8 mmHg MV PHT:        59.16 msec           TR Vmax:        309.00 cm/s MV Decel Time: 204 msec MV E velocity: 58.70 cm/s 103 cm/s  SHUNTS MV A velocity: 66.90 cm/s 70.3 cm/s Systemic VTI:  0.17 m MV E/A ratio:  0.88       1.5       Systemic Diam: 1.90 cm  Yassin Scales D Yehoshua Vitelli MD Electronically signed by Yolonda Kida MD Signature Date/Time: 11/08/2019/9:35:56 AM    Final      Echo borderline normal left ventricular function ejection fraction between 50 and 55% mild to moderate pulmonary hypertension  TELEMETRY: Normal sinus rhythm  ASSESSMENT AND PLAN:  Active Problems:   CHF exacerbation (HCC) Shortness of breath Leg edema COPD History of breast cancer with radiation XRT Esophageal cancer History of smoking History of DVT Anemia Chronic renal insufficiency Hypoxemia . Plan Continue diuretic therapy intravenously with Lasix Agree with supplemental oxygen Continue inhaler therapy Protonix therapy for GERD as well as Carafate Currently on Coreg for heart failure would consider adding ACE or ARB or even Entresto if okay with nephrology Also consider hydralazine Imdur because of renal insufficiency to help with heart failure treatment Consider pulmonary input for COPD possible pneumonitis     Yolonda Kida, MD 11/08/2019 10:58 AM

## 2019-11-08 NOTE — Progress Notes (Signed)
[facility] Cardiology    SUBJECTIVE: Patient states she feels much better but still has some dyspnea shortness of breath.  Denies any pain no leg swelling no palpitations or tachycardia.  She began to feel well enough to be discharged home hopefully within 24 hours   Vitals:   11/08/19 0738 11/08/19 1518 11/08/19 1519 11/08/19 2003  BP: 108/76 93/65 94/71  99/70  Pulse: 71 95 98 85  Resp: 17 20  20   Temp: 97.8 F (36.6 C) 98 F (36.7 C)  98.6 F (37 C)  TempSrc: Oral Oral  Oral  SpO2: 98% 96% 96% 96%  Weight:      Height:         Intake/Output Summary (Last 24 hours) at 11/08/2019 2217 Last data filed at 11/08/2019 2003 Gross per 24 hour  Intake 240 ml  Output 0 ml  Net 240 ml      PHYSICAL EXAM  General: Well developed, well nourished, in no acute distress HEENT:  Normocephalic and atramatic Neck:  No JVD.  Lungs: Clear bilaterally to auscultation and percussion. Heart: HRRR . Normal S1 and S2 without gallops or murmurs.  Abdomen: Bowel sounds are positive, abdomen soft and non-tender  Msk:  Back normal, normal gait. Normal strength and tone for age. Extremities: No clubbing, cyanosis or edema.   Neuro: Alert and oriented X 3. Psych:  Good affect, responds appropriately   LABS: Basic Metabolic Panel: Recent Labs    11/07/19 0622 11/08/19 0449  NA 142 139  K 3.8 3.6  CL 111 106  CO2 21* 25  GLUCOSE 75 86  BUN 24* 30*  CREATININE 1.86* 2.29*  CALCIUM 9.1 8.9   Liver Function Tests: No results for input(s): AST, ALT, ALKPHOS, BILITOT, PROT, ALBUMIN in the last 72 hours. No results for input(s): LIPASE, AMYLASE in the last 72 hours. CBC: Recent Labs    11/06/19 1205 11/08/19 0449  WBC 5.8 5.8  HGB 9.8* 9.2*  HCT 31.1* 28.1*  MCV 94.0 93.4  PLT 302 309   Cardiac Enzymes: No results for input(s): CKTOTAL, CKMB, CKMBINDEX, TROPONINI in the last 72 hours. BNP: Invalid input(s): POCBNP D-Dimer: No results for input(s): DDIMER in the last 72  hours. Hemoglobin A1C: No results for input(s): HGBA1C in the last 72 hours. Fasting Lipid Panel: No results for input(s): CHOL, HDL, LDLCALC, TRIG, CHOLHDL, LDLDIRECT in the last 72 hours. Thyroid Function Tests: No results for input(s): TSH, T4TOTAL, T3FREE, THYROIDAB in the last 72 hours.  Invalid input(s): FREET3 Anemia Panel: No results for input(s): VITAMINB12, FOLATE, FERRITIN, TIBC, IRON, RETICCTPCT in the last 72 hours.  ECHOCARDIOGRAM COMPLETE  Result Date: 11/08/2019   ECHOCARDIOGRAM REPORT   Patient Name:   Danielle [name] [facility] Date of Exam: 11/07/2019 Medical Rec #:  [id number]       Height:       66.0 in Accession #:    [id number]      Weight:       123.3 lb Date of Birth:  1955/10/24       BSA:          1.63 m Patient Age:    64 years        BP:           120/76 mmHg Patient Gender: F               HR:           101 bpm. Exam Location:  [facility] Procedure: 2D Echo Indications:  DYSPNEA 786.09  History:         Patient has prior history of Echocardiogram examinations, most                  recent 04/21/2018. CHF, COPD, Signs/Symptoms:Syncope; Risk                  Factors:Hypertension.  Sonographer:     Avanell Shackleton Referring Phys:  [id number] VIPUL [facility] Diagnosing Phys: Yolonda Kida MD IMPRESSIONS  1. Left ventricular ejection fraction, by visual estimation, is 50 to 55%. The left ventricle has low normal function. Left ventricular septal wall thickness was mildly increased. Mildly increased left ventricular posterior wall thickness. There is mildly increased left ventricular hypertrophy.  2. Left ventricular diastolic parameters are consistent with Grade I diastolic dysfunction (impaired relaxation).  3. Small to moderate inferior myocardial infarction.  4. The left ventricle demonstrates regional wall motion abnormalities.  5. Global right ventricle has normal systolic function.The right ventricular size is normal. No increase in right ventricular wall thickness.  6. Left atrial size was  normal.  7. Right atrial size was normal.  8. The mitral valve is normal in structure. No evidence of mitral valve regurgitation.  9. The tricuspid valve is normal in structure. 10. The tricuspid valve is normal in structure. Tricuspid valve regurgitation is mild. 11. The aortic valve is normal in structure. Aortic valve regurgitation is not visualized. Mild aortic valve sclerosis without stenosis. 12. The pulmonic valve was grossly normal. Pulmonic valve regurgitation is not visualized. 13. Moderately elevated pulmonary artery systolic pressure. FINDINGS  Left Ventricle: Left ventricular ejection fraction, by visual estimation, is 50 to 55%. The left ventricle has low normal function. The left ventricle demonstrates regional wall motion abnormalities. Mildly increased left ventricular posterior wall thickness. There is mildly increased left ventricular hypertrophy. Concentric left ventricular hypertrophy. Left ventricular diastolic parameters are consistent with Grade I diastolic dysfunction (impaired relaxation). There is evidence of a small to moderate inferior myocardial infarction. Right Ventricle: The right ventricular size is normal. No increase in right ventricular wall thickness. Global RV systolic function is has normal systolic function. The tricuspid regurgitant velocity is 2.82 m/s, and with an assumed right atrial pressure  of 10 mmHg, the estimated right ventricular systolic pressure is moderately elevated at 41.8 mmHg. Left Atrium: Left atrial size was normal in size. Right Atrium: Right atrial size was normal in size Pericardium: There is no evidence of pericardial effusion. Mitral Valve: The mitral valve is normal in structure. No evidence of mitral valve regurgitation. Tricuspid Valve: The tricuspid valve is normal in structure. Tricuspid valve regurgitation is mild. Aortic Valve: The aortic valve is normal in structure. Aortic valve regurgitation is not visualized. Mild aortic valve sclerosis is  present, with no evidence of aortic valve stenosis. Aortic valve mean gradient measures 10.0 mmHg. Aortic valve peak gradient measures 17.7 mmHg. Aortic valve area, by VTI measures 1.14 cm. Pulmonic Valve: The pulmonic valve was grossly normal. Pulmonic valve regurgitation is not visualized. Pulmonic regurgitation is not visualized. Aorta: The aortic root is normal in size and structure. IAS/Shunts: No atrial level shunt detected by color flow Doppler.  LEFT VENTRICLE PLAX 2D LVIDd:         3.51 cm  Diastology LVIDs:         2.54 cm  LV e' lateral:   9.36 cm/s LV PW:         1.15 cm  LV E/e' lateral: 6.3 LV IVS:  0.96 cm  LV e' medial:    5.55 cm/s LVOT diam:     1.90 cm  LV E/e' medial:  10.6 LV SV:         28 ml LV SV Index:   17.40 LVOT Area:     2.84 cm  RIGHT VENTRICLE             IVC RV S prime:     11.30 cm/s  IVC diam: 1.50 cm LEFT ATRIUM             Index       RIGHT ATRIUM           Index LA diam:        4.30 cm 2.64 cm/m  RA Area:     13.40 cm LA Vol (A2C):   49.1 ml 30.16 ml/m RA Volume:   31.10 ml  19.10 ml/m LA Vol (A4C):   35.0 ml 21.50 ml/m LA Biplane Vol: 41.9 ml 25.73 ml/m  AORTIC VALVE AV Area (Vmax):    1.05 cm AV Area (Vmean):   1.03 cm AV Area (VTI):     1.14 cm AV Vmax:           210.20 cm/s AV Vmean:          147.600 cm/s AV VTI:            0.412 m AV Peak Grad:      17.7 mmHg AV Mean Grad:      10.0 mmHg LVOT Vmax:         77.60 cm/s LVOT Vmean:        53.800 cm/s LVOT VTI:          0.166 m LVOT/AV VTI ratio: 0.40  AORTA Ao Root diam: 3.20 cm MITRAL VALVE                        TRICUSPID VALVE MV Area (PHT): 3.72 cm             TR Peak grad:   31.8 mmHg MV PHT:        59.16 msec           TR Vmax:        309.00 cm/s MV Decel Time: 204 msec MV E velocity: 58.70 cm/s 103 cm/s  SHUNTS MV A velocity: 66.90 cm/s 70.3 cm/s Systemic VTI:  0.17 m MV E/A ratio:  0.88       1.5       Systemic Diam: 1.90 cm  Tawnia Schirm D Evely Gainey MD Electronically signed by Yolonda Kida MD Signature  Date/Time: 11/08/2019/9:35:56 AM    Final      Echo preserved left ventricular function ejection fraction around 55%  TELEMETRY: Normal sinus rhythm nonspecific ST-T wave changes:  ASSESSMENT AND PLAN:  Active Problems:   CHF exacerbation (HCC) Shortness of breath Edema COPD Previous history of smoking Chronic renal insufficiency Hypoxemia . Plan Continue current therapy Patient still short of breath would recommend continued management for shortness of breath COPD heart failure Supplemental oxygen as necessary Diuretic therapy as necessary for heart failure symptoms and shortness of breath Inhalers as necessary for COPD Consider hydralazine Imdur for heart failure management because of renal insufficiency      Yolonda Kida, MD 11/08/2019 10:17 PM

## 2019-11-08 NOTE — Plan of Care (Signed)
  Problem: Clinical Measurements: Goal: Diagnostic test results will improve Outcome: Progressing Goal: Respiratory complications will improve Outcome: Progressing Goal: Cardiovascular complication will be avoided Outcome: Progressing   

## 2019-11-08 NOTE — Progress Notes (Addendum)
PROGRESS NOTE    Danielle [name]  PJA:[id number] DOB: [birth year] DOA: 11/06/2019 PCP: [facility], [place] a 64 y.o.femalewith medical history significant ofhypertension, COPD, GERD, depression with anxiety, breast cancer (mastectomy, chemo and radiation therapy), lung cancer (radiation therapy), CHF with EF 35-40%, MGUS, CKD-3b, ileostomy,positive covid 19 Ag on 12/13 and positive covid 19 PCR on 12/30 being admitted for CHF exacerbation  Assessment & Plan:   Active Problems:   CHF exacerbation (HCC)  Acute on chronic systolic CHF 2D echo on 12/17/1935 showed EF 35-40%.  - hold Lasix per nephrology as kidney function has been worsening now  repeat echo 1/24 shows normal LV systolic function with EF of 50 to 55% Cardiology input appreciated, she is -1.5 L fluid balance Monitor on telemetry Strict I's and O's and daily weights  Acute on chronic kidney disease stage IIIb:  -Creatinine jumped from 1.8->2.2.  Likely due to diuresis and dye which she received for CT on admission - Hold Lasix, nephro consult  Moderate COPD (chronic obstructive pulmonary disease) ([place]): -Continue bronchodilators.  Depression: No SI or HI -continue home meds  HTN (hypertension): Monitor while on Coreg  Anemia of chronic kidney disease:Hemoglobin 9.8 s/p 1 packed RBC transfusion on 1/19 - getting Epogen as an outpatient per hematology Dr. Rogue [name] - s/p ileostomy secondary to colon ulceration in 2019 at the hepatic flexure. Follows with Dr. Windell [name]  Suspected MGUS -followed by Dr. 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  Currently s/p definitive radiation [dec 7th 2020]. Clinically stable.  Oncology is planning PET scan for follow-up next 4 to 6 weeks  GERD (gastroesophageal reflux disease):  -PPI  Severe protein calorie malnourishment: recently was diagnosed with distal esophageal cancer she follows with  [facility]/Dr. Rogue [name]. Continue w/ nutritional supplements   Generalized weakness: PT/OT consulted      DVT prophylaxis: heparin Code Status: full  Family Communication: Discussed with patient directly Disposition Plan: In next 2 to 3 days depending on clinical condition   Consultants:   Cardio    Procedures: none   Antimicrobials:    Subjective: shortness of breath is improving.  Objective: Vitals:   11/07/19 1558 11/07/19 1917 11/08/19 0340 11/08/19 0738  BP: 93/61 92/68 113/77 108/76  Pulse: 92 88 84 71  Resp: 18 18 16 17   Temp: 98.5 F (36.9 C) 99 F (37.2 C) 99 F (37.2 C) 97.8 F (36.6 C)  TempSrc: Oral Oral Oral Oral  SpO2: 98% 98% 96% 98%  Weight:   54.9 kg   Height:        Intake/Output Summary (Last 24 hours) at 11/08/2019 1425 Last data filed at 11/08/2019 0908 Gross per 24 hour  Intake 290 ml  Output --  Net 290 ml   Filed Weights   11/06/19 1825 11/07/19 0400 11/08/19 0340  Weight: 56.3 kg 55.9 kg 54.9 kg    Examination:  General exam: Appears calm & comfortable  Respiratory system: decreaded breath sounds b/l. Cardiovascular system: S1 & S2 +.  No rubs, gallops or clicks.  Gastrointestinal system: Abdomen is nondistended, soft , NT. Ostomy bag in place. Normal bowel sounds heard. Central nervous system: Alert and oriented. Moves all 4 extremities Psychiatry: Judgement and insight appear normal. Flat mood and affect     Data Reviewed: I have personally reviewed following labs and imaging studies  CBC: Recent Labs  Lab 11/02/19 0412 11/03/19 0610 11/04/19 0432 11/06/19 1205 11/08/19 0449  WBC 5.2  6.2 5.1 5.8 5.8  HGB 7.1* 7.3* 8.9* 9.8* 9.2*  HCT 21.8* 22.9* 28.0* 31.1* 28.1*  MCV 97.3 99.1 94.6 94.0 93.4  PLT 283 287 286 302 829   Basic Metabolic Panel: Recent Labs  Lab 11/03/19 0610 11/04/19 0432 11/06/19 1205 11/07/19 0622 11/08/19 0449  NA 141 141 142 142 139  K 4.7 4.7 4.6 3.8 3.6  CL 119* 120* 116* 111 106   CO2 15* 16* 18* 21* 25  GLUCOSE 80 83 84 75 86  BUN 43* 38* 24* 24* 30*  CREATININE 2.35* 1.86* 1.84* 1.86* 2.29*  CALCIUM 8.2* 8.3* 9.1 9.1 8.9      Radiology Studies: CT Angio Chest PE W and/or Wo Contrast  Result Date: 11/06/2019 CLINICAL DATA:  Shortness of breath, recent COVID EXAM: CT ANGIOGRAPHY CHEST WITH CONTRAST TECHNIQUE: Multidetector CT imaging of the chest was performed using the standard protocol during bolus administration of intravenous contrast. Multiplanar CT image reconstructions and MIPs were obtained to evaluate the vascular anatomy. CONTRAST:  26mL OMNIPAQUE IOHEXOL 350 MG/ML SOLN COMPARISON:  11/19/2018 FINDINGS: Cardiovascular: Satisfactory opacification of the pulmonary arteries to the segmental level. No evidence of pulmonary embolism. Normal heart size. No pericardial effusion. Coronary artery calcification. Calcified plaque is present along the thoracic aorta. Mediastinum/Nodes: There is no mediastinal, hilar, or axillary adenopathy. Lungs/Pleura: Small to moderate right pleural effusion. Greater confluent appearance of anterior right upper lobe opacity. Similar appearance of contiguous more superior right upper lobe opacity. Mild bibasilar atelectasis. Moderate emphysema. Upper Abdomen: No acute abnormality. Musculoskeletal: No acute abnormality. Review of the MIP images confirms the above findings. IMPRESSION: No acute pulmonary embolism. Small to moderate right pleural effusion. More confluent appearance of anterior right upper lobe treated mass. Emphysema and aortic atherosclerosis. Electronically Signed   By: Macy Mis M.D.   On: 11/06/2019 15:50   ECHOCARDIOGRAM COMPLETE  Result Date: 11/08/2019   ECHOCARDIOGRAM REPORT   Patient Name:   Danielle [name] [facility] Date of Exam: 11/07/2019 Medical Rec #:  [id number]       Height:       66.0 in Accession #:    [id number]      Weight:       123.3 lb Date of Birth:  [birth year]       BSA:          1.63 m Patient Age:    21  years        BP:           120/76 mmHg Patient Gender: F               HR:           101 bpm. Exam Location:  [facility] Procedure: 2D Echo Indications:     DYSPNEA 786.09  History:         Patient has prior history of Echocardiogram examinations, most                  recent 04/21/2018. CHF, COPD, Signs/Symptoms:Syncope; Risk                  Factors:Hypertension.  Sonographer:     Avanell Shackleton Referring Phys:  [id number] Callen Vancuren [facility] Diagnosing Phys: Yolonda Kida MD IMPRESSIONS  1. Left ventricular ejection fraction, by visual estimation, is 50 to 55%. The left ventricle has low normal function. Left ventricular septal wall thickness was mildly increased. Mildly increased left ventricular posterior wall thickness. There is mildly increased left ventricular hypertrophy.  2. Left  ventricular diastolic parameters are consistent with Grade I diastolic dysfunction (impaired relaxation).  3. Small to moderate inferior myocardial infarction.  4. The left ventricle demonstrates regional wall motion abnormalities.  5. Global right ventricle has normal systolic function.The right ventricular size is normal. No increase in right ventricular wall thickness.  6. Left atrial size was normal.  7. Right atrial size was normal.  8. The mitral valve is normal in structure. No evidence of mitral valve regurgitation.  9. The tricuspid valve is normal in structure. 10. The tricuspid valve is normal in structure. Tricuspid valve regurgitation is mild. 11. The aortic valve is normal in structure. Aortic valve regurgitation is not visualized. Mild aortic valve sclerosis without stenosis. 12. The pulmonic valve was grossly normal. Pulmonic valve regurgitation is not visualized. 13. Moderately elevated pulmonary artery systolic pressure. FINDINGS  Left Ventricle: Left ventricular ejection fraction, by visual estimation, is 50 to 55%. The left ventricle has low normal function. The left ventricle demonstrates regional wall motion abnormalities.  Mildly increased left ventricular posterior wall thickness. There is mildly increased left ventricular hypertrophy. Concentric left ventricular hypertrophy. Left ventricular diastolic parameters are consistent with Grade I diastolic dysfunction (impaired relaxation). There is evidence of a small to moderate inferior myocardial infarction. Right Ventricle: The right ventricular size is normal. No increase in right ventricular wall thickness. Global RV systolic function is has normal systolic function. The tricuspid regurgitant velocity is 2.82 m/s, and with an assumed right atrial pressure  of 10 mmHg, the estimated right ventricular systolic pressure is moderately elevated at 41.8 mmHg. Left Atrium: Left atrial size was normal in size. Right Atrium: Right atrial size was normal in size Pericardium: There is no evidence of pericardial effusion. Mitral Valve: The mitral valve is normal in structure. No evidence of mitral valve regurgitation. Tricuspid Valve: The tricuspid valve is normal in structure. Tricuspid valve regurgitation is mild. Aortic Valve: The aortic valve is normal in structure. Aortic valve regurgitation is not visualized. Mild aortic valve sclerosis is present, with no evidence of aortic valve stenosis. Aortic valve mean gradient measures 10.0 mmHg. Aortic valve peak gradient measures 17.7 mmHg. Aortic valve area, by VTI measures 1.14 cm. Pulmonic Valve: The pulmonic valve was grossly normal. Pulmonic valve regurgitation is not visualized. Pulmonic regurgitation is not visualized. Aorta: The aortic root is normal in size and structure. IAS/Shunts: No atrial level shunt detected by color flow Doppler.  LEFT VENTRICLE PLAX 2D LVIDd:         3.51 cm  Diastology LVIDs:         2.54 cm  LV e' lateral:   9.36 cm/s LV PW:         1.15 cm  LV E/e' lateral: 6.3 LV IVS:        0.96 cm  LV e' medial:    5.55 cm/s LVOT diam:     1.90 cm  LV E/e' medial:  10.6 LV SV:         28 ml LV SV Index:   17.40 LVOT Area:      2.84 cm  RIGHT VENTRICLE             IVC RV S prime:     11.30 cm/s  IVC diam: 1.50 cm LEFT ATRIUM             Index       RIGHT ATRIUM           Index LA diam:  4.30 cm 2.64 cm/m  RA Area:     13.40 cm LA Vol (A2C):   49.1 ml 30.16 ml/m RA Volume:   31.10 ml  19.10 ml/m LA Vol (A4C):   35.0 ml 21.50 ml/m LA Biplane Vol: 41.9 ml 25.73 ml/m  AORTIC VALVE AV Area (Vmax):    1.05 cm AV Area (Vmean):   1.03 cm AV Area (VTI):     1.14 cm AV Vmax:           210.20 cm/s AV Vmean:          147.600 cm/s AV VTI:            0.412 m AV Peak Grad:      17.7 mmHg AV Mean Grad:      10.0 mmHg LVOT Vmax:         77.60 cm/s LVOT Vmean:        53.800 cm/s LVOT VTI:          0.166 m LVOT/AV VTI ratio: 0.40  AORTA Ao Root diam: 3.20 cm MITRAL VALVE                        TRICUSPID VALVE MV Area (PHT): 3.72 cm             TR Peak grad:   31.8 mmHg MV PHT:        59.16 msec           TR Vmax:        309.00 cm/s MV Decel Time: 204 msec MV E velocity: 58.70 cm/s 103 cm/s  SHUNTS MV A velocity: 66.90 cm/s 70.3 cm/s Systemic VTI:  0.17 m MV E/A ratio:  0.88       1.5       Systemic Diam: 1.90 cm  Dwayne Prince Rome MD Electronically signed by Yolonda Kida MD Signature Date/Time: 11/08/2019/9:35:56 AM    Final         Scheduled Meds: . calcium carbonate  0.5 tablet Oral Daily  . calcium-vitamin D  1 tablet Oral Daily  . carvedilol  3.125 mg Oral BID WC  . citalopram  40 mg Oral Daily  . enoxaparin (LOVENOX) injection  30 mg Subcutaneous Q24H  . feeding supplement (NEPRO CARB STEADY)  237 mL Oral TID BM  . ferrous sulfate  325 mg Oral BID WC  . fluticasone furoate-vilanterol  1 puff Inhalation Daily   And  . umeclidinium bromide  1 puff Inhalation Daily  . multivitamin with minerals  1 tablet Oral Daily  . pantoprazole  40 mg Oral BID AC  . sodium bicarbonate  1,300 mg Oral TID  . sodium chloride flush  3 mL Intravenous Q12H   Continuous Infusions: . sodium chloride       LOS: 2 days    Time  spent: 33 mins     Max Sane, MD [facility]  11/08/2019, 2:25 PM

## 2019-11-09 LAB — BASIC METABOLIC PANEL
Anion gap: 11 (ref 5–15)
BUN: 35 mg/dL — ABNORMAL HIGH (ref 8–23)
CO2: 22 mmol/L (ref 22–32)
Calcium: 9 mg/dL (ref 8.9–10.3)
Chloride: 107 mmol/L (ref 98–111)
Creatinine, Ser: 2.51 mg/dL — ABNORMAL HIGH (ref 0.44–1.00)
GFR calc Af Amer: 23 mL/min — ABNORMAL LOW (ref >60)
GFR calc non Af Amer: 19 mL/min — ABNORMAL LOW (ref >60)
Glucose, Bld: 80 mg/dL (ref 70–99)
Potassium: 3.7 mmol/L (ref 3.5–5.1)
Sodium: 140 mmol/L (ref 135–145)

## 2019-11-09 MED ORDER — SODIUM CHLORIDE 0.9 % IV SOLN: INTRAVENOUS | Status: DC

## 2019-11-09 NOTE — Plan of Care (Signed)
  Problem: Clinical Measurements: Goal: Respiratory complications will improve Outcome: Progressing   Problem: Activity: Goal: Risk for activity intolerance will decrease Outcome: Progressing   Problem: Elimination: Goal: Will not experience complications related to bowel motility Outcome: Progressing   

## 2019-11-09 NOTE — Progress Notes (Signed)
PROGRESS NOTE    Danielle DORFMAN  JQB:[id number] DOB: 1956/02/02 DOA: 11/06/2019 PCP: [facility], [place] a 64 y.o.femalewith medical history significant ofhypertension, COPD, GERD, depression with anxiety, breast cancer (mastectomy, chemo and radiation therapy), lung cancer (radiation therapy), CHF with EF 35-40%, MGUS, CKD-3b, ileostomy,positive covid 19 Ag on 12/13 and positive covid 19 PCR on 12/30 being admitted for CHF exacerbation  Assessment & Plan:   Active Problems:   CHF exacerbation (HCC)  Acute on chronic systolic CHF 2D echo on 4/0/9735 showed EF 35-40%.  - hold Lasix per nephrology as kidney function has been worsening now, started gentle hydration  repeat echo 1/24 shows normal LV systolic function with EF of 50 to 55% Cardiology input appreciated, she is -1.5 L fluid balance Monitor on telemetry Strict I's and O's and daily weights  Acute on chronic kidney disease stage IIIb:  -Creatinine jumped from 1.8->2.5.  Likely due to diuresis and dye which she received for CT on admission, gentle hydration started per nephro due to worsening renal function - Hold Lasix, nephro consult  Moderate COPD (chronic obstructive pulmonary disease) ([place]): -Continue bronchodilators.  Depression: No SI or HI -continue home meds  HTN (hypertension): Monitor while on Coreg  Anemia of chronic kidney disease:Hemoglobin 9.8 s/p 1 packed RBC transfusion on 1/19 - getting Epogen as an outpatient per hematology Dr. Rogue Bussing - s/p ileostomy secondary to colon ulceration in 2019 at the hepatic flexure. Follows with Dr. 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  Currently s/p definitive radiation [dec 7th 2020]. Clinically stable.  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 Rogue Bussing. Continue w/ nutritional supplements   Generalized weakness: PT/OT - no skilled needs      DVT prophylaxis: heparin Code Status: full  Family Communication: Discussed with patient directly Disposition Plan: In next 1-2 days depending on clinical condition, renal function improvement   Consultants:   Cardio    Procedures: none   Antimicrobials:    Subjective: shortness of breath is improving. Worsening renal function  Objective: Vitals:   11/08/19 2003 11/09/19 0440 11/09/19 0727 11/09/19 1618  BP: 99/70 103/68 103/68 101/67  Pulse: 85 79 79 81  Resp: 20 15 18 18   Temp: 98.6 F (37 C) 97.6 F (36.4 C) (!) 97.5 F (36.4 C) 98.4 F (36.9 C)  TempSrc: Oral Oral Oral Oral  SpO2: 96% 95% 95% 95%  Weight:  53.7 kg    Height:        Intake/Output Summary (Last 24 hours) at 11/09/2019 1952 Last data filed at 11/09/2019 1300 Gross per 24 hour  Intake 0 ml  Output 0 ml  Net 0 ml   Filed Weights   11/07/19 0400 11/08/19 0340 11/09/19 0440  Weight: 55.9 kg 54.9 kg 53.7 kg    Examination:  General exam: Appears calm & comfortable  Respiratory system: decreaded breath sounds b/l. Cardiovascular system: S1 & S2 +.  No rubs, gallops or clicks.  Gastrointestinal system: Abdomen is nondistended, soft , NT. Ostomy bag in place. Normal bowel sounds heard. Central nervous system: Alert and oriented. Moves all 4 extremities Psychiatry: Judgement and insight appear normal. Flat mood and affect     Data Reviewed: I have personally reviewed following labs  and imaging studies  CBC: Recent Labs  Lab 11/03/19 0610 11/04/19 0432 11/06/19 1205 11/08/19 0449  WBC 6.2 5.1 5.8 5.8  HGB 7.3* 8.9* 9.8* 9.2*  HCT 22.9* 28.0* 31.1* 28.1*  MCV 99.1 94.6 94.0 93.4  PLT 287 286 302 867   Basic Metabolic Panel: Recent Labs  Lab 11/04/19 0432 11/06/19 1205  11/07/19 0622 11/08/19 0449 11/09/19 0525  NA 141 142 142 139 140  K 4.7 4.6 3.8 3.6 3.7  CL 120* 116* 111 106 107  CO2 16* 18* 21* 25 22  GLUCOSE 83 84 75 86 80  BUN 38* 24* 24* 30* 35*  CREATININE 1.86* 1.84* 1.86* 2.29* 2.51*  CALCIUM 8.3* 9.1 9.1 8.9 9.0      Radiology Studies: No results found.      Scheduled Meds: . calcium carbonate  0.5 tablet Oral Daily  . calcium-vitamin D  1 tablet Oral Daily  . carvedilol  3.125 mg Oral BID WC  . citalopram  40 mg Oral Daily  . enoxaparin (LOVENOX) injection  30 mg Subcutaneous Q24H  . feeding supplement (NEPRO CARB STEADY)  237 mL Oral TID BM  . ferrous sulfate  325 mg Oral BID WC  . fluticasone furoate-vilanterol  1 puff Inhalation Daily   And  . umeclidinium bromide  1 puff Inhalation Daily  . multivitamin with minerals  1 tablet Oral Daily  . pantoprazole  40 mg Oral BID AC  . sodium bicarbonate  1,300 mg Oral TID  . sodium chloride flush  3 mL Intravenous Q12H   Continuous Infusions: . sodium chloride    . sodium chloride 50 mL/hr at 11/09/19 1429     LOS: 3 days    Time spent: 35 mins     Max Sane, MD [facility]  11/09/2019, 7:52 PM

## 2019-11-09 NOTE — Care Management Important Message (Signed)
Important Message  Patient Details  Name: Danielle [name] MRN: [id number] Date of Birth: 02-07-1956   Medicare Important Message Given:  Yes     [name] 11/09/2019, 1:00 PM

## 2019-11-09 NOTE — Progress Notes (Signed)
[facility], [place] 11/09/19  Subjective:  Renal function a bit worse today as creatinine up to 2.5. BUN also up to 35.  Objective:  Vital signs in last 24 hours:  Temp:  [97.5 F (36.4 C)-98.6 F (37 C)] 97.5 F (36.4 C) (01/25 0727) Pulse Rate:  [79-98] 79 (01/25 0727) Resp:  [15-20] 18 (01/25 0727) BP: (93-103)/(65-71) 103/68 (01/25 0727) SpO2:  [95 %-96 %] 95 % (01/25 0727) Weight:  [53.7 kg] 53.7 kg (01/25 0440)  Weight change: -1.231 kg Filed Weights   11/07/19 0400 11/08/19 0340 11/09/19 0440  Weight: 55.9 kg 54.9 kg 53.7 kg    Intake/Output:    Intake/Output Summary (Last 24 hours) at 11/09/2019 1229 Last data filed at 11/08/2019 2003 Gross per 24 hour  Intake --  Output 0 ml  Net 0 ml     Physical Exam: General:  Laying in bed  HEENT  moist oral mucous membranes  Pulm/lungs  minimal basilar rales  CVS/Heart  irregular rhythm  Abdomen:   Soft, nontender, ostomy in place with liquid stool  Extremities:  No peripheral edema  Neurologic:  Alert, able to follow some commands  Skin:  No acute rashes    Basic Metabolic Panel:  Recent Labs  Lab 11/04/19 0432 11/04/19 0432 11/06/19 1205 11/06/19 1205 11/07/19 0622 11/08/19 0449 11/09/19 0525  NA 141  --  142  --  142 139 140  K 4.7  --  4.6  --  3.8 3.6 3.7  CL 120*  --  116*  --  111 106 107  CO2 16*  --  18*  --  21* 25 22  GLUCOSE 83  --  84  --  75 86 80  BUN 38*  --  24*  --  24* 30* 35*  CREATININE 1.86*  --  1.84*  --  1.86* 2.29* 2.51*  CALCIUM 8.3*   < > 9.1   < > 9.1 8.9 9.0   < > = values in this interval not displayed.     CBC: Recent Labs  Lab 11/03/19 0610 11/04/19 0432 11/06/19 1205 11/08/19 0449  WBC 6.2 5.1 5.8 5.8  HGB 7.3* 8.9* 9.8* 9.2*  HCT 22.9* 28.0* 31.1* 28.1*  MCV 99.1 94.6 94.0 93.4  PLT 287 286 302 309     No results found for: HEPBSAG, HEPBSAB, HEPBIGM    Microbiology:  Recent Results (from the past 240 hour(s))  C  difficile quick scan w PCR reflex     Status: None   Collection Time: 10/31/19 12:21 PM   Specimen: Abdomen; Stool  Result Value Ref Range Status   C Diff antigen NEGATIVE NEGATIVE Final   C Diff toxin NEGATIVE NEGATIVE Final   C Diff interpretation No C. difficile detected.  Final    Comment: Performed at [facility], [place],  [address]  SARS CORONAVIRUS 2 (TAT 6-24 HRS) Nasopharyngeal Nasopharyngeal Swab     Status: None   Collection Time: 11/06/19  4:50 PM   Specimen: Nasopharyngeal Swab  Result Value Ref Range Status   SARS Coronavirus 2 NEGATIVE NEGATIVE Final    Comment: (NOTE) SARS-CoV-2 target nucleic acids are NOT DETECTED. The SARS-CoV-2 RNA is generally detectable in upper and lower respiratory specimens during the acute phase of infection. Negative results do not preclude SARS-CoV-2 infection, do not rule out co-infections with other pathogens, and should not be used as the sole basis for treatment or other patient management decisions. Negative  results must be combined with clinical observations, patient history, and epidemiological information. The expected result is Negative. Fact Sheet for Patients: SugarRoll.be Fact Sheet for Healthcare Providers: https://www.woods-mathews.com/ This test is not yet approved or cleared by the [place] FDA and  has been authorized for detection and/or diagnosis of SARS-CoV-2 by FDA under an Emergency Use Authorization (EUA). This EUA will remain  in effect (meaning this test can be used) for the duration of the COVID-19 declaration under Section 56 4(b)(1) of the Act, 21 U.S.C. section 360bbb-3(b)(1), unless the authorization is terminated or revoked sooner. Performed at [facility], [place] [address] [place], [place] [address]     Coagulation Studies: No results for input(s): LABPROT, INR in the last 72 hours.  Urinalysis: No results for  input(s): COLORURINE, LABSPEC, PHURINE, GLUCOSEU, HGBUR, BILIRUBINUR, KETONESUR, PROTEINUR, UROBILINOGEN, NITRITE, LEUKOCYTESUR in the last 72 hours.  Invalid input(s): APPERANCEUR    Imaging: No results found.   Medications:   . sodium chloride     . calcium carbonate  0.5 tablet Oral Daily  . calcium-vitamin D  1 tablet Oral Daily  . carvedilol  3.125 mg Oral BID WC  . citalopram  40 mg Oral Daily  . enoxaparin (LOVENOX) injection  30 mg Subcutaneous Q24H  . feeding supplement (NEPRO CARB STEADY)  237 mL Oral TID BM  . ferrous sulfate  325 mg Oral BID WC  . fluticasone furoate-vilanterol  1 puff Inhalation Daily   And  . umeclidinium bromide  1 puff Inhalation Daily  . multivitamin with minerals  1 tablet Oral Daily  . pantoprazole  40 mg Oral BID AC  . sodium bicarbonate  1,300 mg Oral TID  . sodium chloride flush  3 mL Intravenous Q12H   sodium chloride, acetaminophen, albuterol, guaiFENesin-dextromethorphan, loperamide, ondansetron (ZOFRAN) IV, sodium chloride flush, sucralfate  Assessment/ Plan:  64 y.o. female with COPD, chronic systolic CHF, hypertension, anemia, GERD, depression, history of breast cancer treated with mastectomy, chemo, radiation;  lung cancer, history of colonic stricture status post hemi-colectomy with ostomy, lower esophageal cancer 2020 ongoing radiation admitted on 11/06/2019 for Shortness of breath [T55.73] Chronic systolic heart failure (HCC) [I50.22] CHF exacerbation ([place]) [I50.9] Acute on chronic congestive heart failure, unspecified heart failure type ([place]) [I50.9]  #Acute renal failure with history of M spike. Current episode of acute kidney injury likely related to contrast exposure as well as IV Lasix.  At the last visit she was found to have an M spike but given improving renal function renal biopsy was not recommended.   -Renal function appears to be worse today.  Start the patient on gentle IV fluid hydration with 0.9 normal saline at 50  cc/h.  #Chronic kidney disease stage IIIb Lab Results  Component Value Date   CREATININE 2.51 (H) 11/09/2019   Baseline creatinine of 1.7, GFR 36 from October 21, 2019 Underlying CKD is likely secondary to hypertension, atherosclerosis 11/19/2019 Office Visit Nephrology Anthonette Legato, MD      #Anemia of CKD. Continue to periodically monitor CBC.    LOS: 3 Rodderick Holtzer 1/25/202112:29 PM  [place], [place]  Note: This note was prepared with Dragon dictation. Any transcription errors are unintentional

## 2019-11-09 NOTE — Progress Notes (Signed)
Physical Therapy Treatment Patient Details Name: Danielle [name] MRN: [id number] DOB: 1956-09-26 Today's Date: 11/09/2019    History of Present Illness Patient is 64 year old female admitted with SOB. PMH includes: COPD, GERD, depression with anxiety, breast Ca, lung Ca, CHF. Recently discharged from here.    PT Comments    Out of bed and completed one lap around nursing unit with no AD and supervision.  Gait generally steady and tolerates well.    Follow Up Recommendations  No PT follow up     Equipment Recommendations  None recommended by PT    Recommendations for Other Services       Precautions / Restrictions Precautions Precautions: Fall Restrictions Weight Bearing Restrictions: No Other Position/Activity Restrictions: ostomy    Mobility  Bed Mobility Overal bed mobility: Modified Independent                Transfers Overall transfer level: Modified independent                  Ambulation/Gait Ambulation/Gait assistance: Supervision Gait Distance (Feet): 160 Feet Assistive device: None Gait Pattern/deviations: Step-through pattern Gait velocity: decreased   General Gait Details: steady   Chief Strategy Officer    Modified Rankin (Stroke Patients Only)       Balance Overall balance assessment: No apparent balance deficits (not formally assessed)   Sitting balance-Leahy Scale: Normal       Standing balance-Leahy Scale: Good                              Cognition Arousal/Alertness: Awake/alert Behavior During Therapy: WFL for tasks assessed/performed Overall Cognitive Status: Within Functional Limits for tasks assessed                                        Exercises      General Comments        Pertinent Vitals/Pain Pain Assessment: No/denies pain    Home Living                      Prior Function            PT Goals (current goals can now be found in  the care plan section) Progress towards PT goals: Progressing toward goals    Frequency    Min 2X/week      PT Plan Current plan remains appropriate    Co-evaluation              AM-PAC PT "6 Clicks" Mobility   Outcome Measure  Help needed turning from your back to your side while in a flat bed without using bedrails?: None Help needed moving from lying on your back to sitting on the side of a flat bed without using bedrails?: None Help needed moving to and from a bed to a chair (including a wheelchair)?: None Help needed standing up from a chair using your arms (e.g., wheelchair or bedside chair)?: None Help needed to walk in hospital room?: None Help needed climbing 3-5 steps with a railing? : A Little 6 Click Score: 23    End of Session Equipment Utilized During Treatment: Gait belt Activity Tolerance: Patient tolerated treatment well Patient left: in bed;with call bell/phone within reach;Other (comment) Nurse Communication: Mobility status  Time: 0623-7628 PT Time Calculation (min) (ACUTE ONLY): 8 min  Charges:  $Gait Training: 8-22 mins                    Chesley Noon, PTA 11/09/19, 4:43 PM

## 2019-11-10 LAB — CBC
HCT: 28 % — ABNORMAL LOW (ref 36.0–46.0)
Hemoglobin: 8.9 g/dL — ABNORMAL LOW (ref 12.0–15.0)
MCH: 30 pg (ref 26.0–34.0)
MCHC: 31.8 g/dL (ref 30.0–36.0)
MCV: 94.3 fL (ref 80.0–100.0)
Platelets: 290 K/uL (ref 150–400)
RBC: 2.97 MIL/uL — ABNORMAL LOW (ref 3.87–5.11)
RDW: 18.9 % — ABNORMAL HIGH (ref 11.5–15.5)
WBC: 5.9 K/uL (ref 4.0–10.5)
nRBC: 0.3 % — ABNORMAL HIGH (ref 0.0–0.2)

## 2019-11-10 LAB — BASIC METABOLIC PANEL WITH GFR
Anion gap: 7 (ref 5–15)
BUN: 37 mg/dL — ABNORMAL HIGH (ref 8–23)
CO2: 23 mmol/L (ref 22–32)
Calcium: 8.4 mg/dL — ABNORMAL LOW (ref 8.9–10.3)
Chloride: 107 mmol/L (ref 98–111)
Creatinine, Ser: 2.45 mg/dL — ABNORMAL HIGH (ref 0.44–1.00)
GFR calc Af Amer: 23 mL/min — ABNORMAL LOW
GFR calc non Af Amer: 20 mL/min — ABNORMAL LOW
Glucose, Bld: 80 mg/dL (ref 70–99)
Potassium: 4 mmol/L (ref 3.5–5.1)
Sodium: 137 mmol/L (ref 135–145)

## 2019-11-10 MED ORDER — CARVEDILOL 6.25 MG PO TABS
6.2500 mg | ORAL_TABLET | Freq: Two times a day (BID) | ORAL | Status: DC
  Administered 2019-11-10 – 2019-11-11 (×2): 6.25 mg via ORAL
  Filled 2019-11-10 (×2): qty 1

## 2019-11-10 MED ORDER — DM-GUAIFENESIN ER 30-600 MG PO TB12
1.0000 | ORAL_TABLET | Freq: Two times a day (BID) | ORAL | Status: DC
  Administered 2019-11-10 – 2019-11-11 (×2): 1 via ORAL
  Filled 2019-11-10: qty 1

## 2019-11-10 NOTE — Progress Notes (Signed)
Physical Therapy Treatment Patient Details Name: Danielle [name] MRN: [id number] DOB: 08/15/1956 Today's Date: 11/10/2019    History of Present Illness Patient is 64 year old female admitted with SOB. PMH includes: COPD, GERD, depression with anxiety, breast Ca, lung Ca, CHF. Recently discharged from here, Covid in 12/20.    PT Comments    Pt did well with ambulation and showed good effort, safety and confidence with all aspects of the session.  She was on room air t/o and though sats were in the high 90s at rest, she generally stayed 88-92% during activity (~400 ft, up/down steps) and HR was in the 80-100 range.  She reports feeling somewhat short of breath and tired after the effort but is confident in being able to go home and did not have any issues that would necessitate further PT intervention.   Discussed with pt and she feels good about completing PT orders at this time.    Follow Up Recommendations  No PT follow up     Equipment Recommendations  None recommended by PT    Recommendations for Other Services       Precautions / Restrictions Precautions Precautions: Fall(mod fall) Restrictions Weight Bearing Restrictions: No    Mobility  Bed Mobility               General bed mobility comments: Pt in recliner talking on phone on arrival  Transfers Overall transfer level: Independent Equipment used: None Transfers: Sit to/from Stand Sit to Stand: Independent         General transfer comment: Pt able to easily stand and maintain balance, good confidence  Ambulation/Gait Ambulation/Gait assistance: Modified independent (Device/Increase time) Gait Distance (Feet): 400 Feet Assistive device: None       General Gait Details: Pt with consistent and confident cadence, showing good safety.  O2 remained 89-93% on room air t/o the effort, HR generally 80-100.     Stairs Stairs: Yes Stairs assistance: Supervision Stair Management: One rail Right Number of  Stairs: 5 General stair comments: Pt able to easily and confidently negotiate up/down steps w/ reciprocal pattern and light UE use on rail   Wheelchair Mobility    Modified Rankin (Stroke Patients Only)       Balance Overall balance assessment: No apparent balance deficits (not formally assessed)   Sitting balance-Leahy Scale: Normal       Standing balance-Leahy Scale: Normal                              Cognition Arousal/Alertness: Awake/alert Behavior During Therapy: WFL for tasks assessed/performed Overall Cognitive Status: Within Functional Limits for tasks assessed                                        Exercises      General Comments        Pertinent Vitals/Pain Pain Assessment: No/denies pain    Home Living                      Prior Function            PT Goals (current goals can now be found in the care plan section) Progress towards PT goals: Goals met/education completed, patient discharged from PT    Frequency    Min 2X/week      PT Plan Other (  comment)(will d/c from PT services, discussed with pt and agrees)    Co-evaluation              AM-PAC PT "6 Clicks" Mobility   Outcome Measure  Help needed turning from your back to your side while in a flat bed without using bedrails?: None Help needed moving from lying on your back to sitting on the side of a flat bed without using bedrails?: None Help needed moving to and from a bed to a chair (including a wheelchair)?: None Help needed standing up from a chair using your arms (e.g., wheelchair or bedside chair)?: None Help needed to walk in hospital room?: None Help needed climbing 3-5 steps with a railing? : None 6 Click Score: 24    End of Session Equipment Utilized During Treatment: Gait belt Activity Tolerance: Patient tolerated treatment well Patient left: with call bell/phone within reach;in chair Nurse Communication: Mobility status PT  Visit Diagnosis: Muscle weakness (generalized) (M62.81)     Time: 3354-5625 PT Time Calculation (min) (ACUTE ONLY): 17 min  Charges:  $Gait Training: 8-22 mins                     Kreg Shropshire, DPT 11/10/2019, 2:47 PM

## 2019-11-10 NOTE — Progress Notes (Signed)
PROGRESS NOTE    Danielle [name]  WJX:[id number] DOB: Nov 09, 1955 DOA: 11/06/2019 PCP: [facility] [place] a 64 y.o.femalewith medical history significant ofhypertension, COPD, GERD, depression with anxiety, breast cancer (mastectomy, chemo and radiation therapy), lung cancer (radiation therapy), CHF with EF 35-40%, MGUS, CKD-3b, ileostomy,positive covid 19 Ag on 12/13 and positive covid 19 PCR on 12/30 being admitted for CHF exacerbation  Assessment & Plan:   Active Problems:   CHF exacerbation (HCC)  Acute on chronic systolic CHF 2D echo on 11/15/3084 showed EF 35-40%.  - hold Lasix per nephrology as kidney function has been worsening now, started gentle hydration  repeat echo 1/24 shows normal LV systolic function with EF of 50 to 55% Cardiology input appreciated, she is -1 L fluid balance Monitor on telemetry Strict I's and O's and daily weights  Acute on chronic kidney disease stage IIIb:  -Creatinine jumped from 1.8->2.4.  Likely due to diuresis and dye which she received for CT on admission, gentle hydration per nephro due to worsening renal function.  Nephro recommends to watch her at least 1 more day to make sure kidney function continues to improve - Hold Lasix, nephro following  Moderate COPD (chronic obstructive pulmonary disease) ([place]): -Continue bronchodilators.  Depression: No SI or HI -continue home meds  HTN (hypertension): Monitor while on Coreg  Anemia of chronic kidney disease:Hemoglobin 9.8 s/p 1 packed RBC transfusion on 1/19 - getting Epogen as an outpatient per hematology Dr. Rogue Bussing - s/p ileostomy secondary to colon ulceration in 2019 at the hepatic flexure. Follows with Dr. 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  Currently s/p definitive radiation [dec 7th 2020]. Clinically stable.  Oncology is planning PET scan for  follow-up next 4 to 6 weeks  GERD (gastroesophageal reflux disease):  -PPI  Severe protein calorie malnourishment: recently was diagnosed with distal esophageal cancer she follows with [facility]/Dr. Rogue Bussing. Continue w/ nutritional supplements   Generalized weakness: PT/OT - no skilled needs      DVT prophylaxis: heparin Code Status: full  Family Communication: Discussed with patient directly Disposition Plan: Hopefully tomorrow depending on clinical condition, renal function improvement   Consultants:   Cardio    Procedures: none   Antimicrobials:    Subjective: Sitting in the chair.  No new complaints.  Renal function slowly improving.  Objective: Vitals:   11/09/19 1618 11/09/19 2012 11/10/19 0413 11/10/19 0726  BP: 101/67 101/64 116/76 115/77  Pulse: 81 80 77 82  Resp: 18 19 20 16   Temp: 98.4 F (36.9 C) 98.7 F (37.1 C) 98.4 F (36.9 C) 98.5 F (36.9 C)  TempSrc: Oral Oral Oral Oral  SpO2: 95% 96% 93% 95%  Weight:   55.9 kg   Height:        Intake/Output Summary (Last 24 hours) at 11/10/2019 1544 Last data filed at 11/10/2019 1300 Gross per 24 hour  Intake 480 ml  Output --  Net 480 ml   Filed Weights   11/08/19 0340 11/09/19 0440 11/10/19 0413  Weight: 54.9 kg 53.7 kg 55.9 kg    Examination:  General exam: Appears calm & comfortable  Respiratory system: decreaded breath sounds b/l. Cardiovascular system: S1 & S2 +.  No rubs, gallops or clicks.  Gastrointestinal system: Abdomen is nondistended, soft , NT. Ostomy bag in place. Normal bowel sounds heard. Central nervous system: Alert and oriented. Moves all 4 extremities Psychiatry: Judgement and  insight appear normal. Flat mood and affect     Data Reviewed: I have personally reviewed following labs and imaging studies  CBC: Recent Labs  Lab 11/04/19 0432 11/06/19 1205 11/08/19 0449 11/10/19 0633  WBC 5.1 5.8 5.8 5.9  HGB 8.9* 9.8* 9.2* 8.9*  HCT 28.0* 31.1* 28.1* 28.0*  MCV 94.6  94.0 93.4 94.3  PLT 286 302 309 557   Basic Metabolic Panel: Recent Labs  Lab 11/06/19 1205 11/07/19 0622 11/08/19 0449 11/09/19 0525 11/10/19 0633  NA 142 142 139 140 137  K 4.6 3.8 3.6 3.7 4.0  CL 116* 111 106 107 107  CO2 18* 21* 25 22 23   GLUCOSE 84 75 86 80 80  BUN 24* 24* 30* 35* 37*  CREATININE 1.84* 1.86* 2.29* 2.51* 2.45*  CALCIUM 9.1 9.1 8.9 9.0 8.4*      Radiology Studies: No results found.      Scheduled Meds: . calcium carbonate  0.5 tablet Oral Daily  . calcium-vitamin D  1 tablet Oral Daily  . carvedilol  6.25 mg Oral BID WC  . citalopram  40 mg Oral Daily  . enoxaparin (LOVENOX) injection  30 mg Subcutaneous Q24H  . feeding supplement (NEPRO CARB STEADY)  237 mL Oral TID BM  . ferrous sulfate  325 mg Oral BID WC  . fluticasone furoate-vilanterol  1 puff Inhalation Daily   And  . umeclidinium bromide  1 puff Inhalation Daily  . multivitamin with minerals  1 tablet Oral Daily  . pantoprazole  40 mg Oral BID AC  . sodium bicarbonate  1,300 mg Oral TID  . sodium chloride flush  3 mL Intravenous Q12H   Continuous Infusions: . sodium chloride    . sodium chloride 50 mL/hr at 11/10/19 0915     LOS: 4 days    Time spent: 84 mins     Max Sane, MD [facility]  11/10/2019, 3:44 PM

## 2019-11-10 NOTE — Progress Notes (Signed)
[facility], [place] 11/10/19  Subjective:  Patient was started on IV fluid hydration yesterday. However BUN still a bit high at 37 with a creatinine of 2.45.  Objective:  Vital signs in last 24 hours:  Temp:  [98.4 F (36.9 C)-98.7 F (37.1 C)] 98.5 F (36.9 C) (01/26 0726) Pulse Rate:  [77-82] 82 (01/26 0726) Resp:  [16-20] 16 (01/26 0726) BP: (101-116)/(64-77) 115/77 (01/26 0726) SpO2:  [93 %-96 %] 95 % (01/26 0726) Weight:  [55.9 kg] 55.9 kg (01/26 0413)  Weight change: 2.229 kg Filed Weights   11/08/19 0340 11/09/19 0440 11/10/19 0413  Weight: 54.9 kg 53.7 kg 55.9 kg    Intake/Output:    Intake/Output Summary (Last 24 hours) at 11/10/2019 1112 Last data filed at 11/10/2019 0900 Gross per 24 hour  Intake 240 ml  Output --  Net 240 ml     Physical Exam: General:  Laying in bed  HEENT  moist oral mucous membranes  Pulm/lungs  minimal basilar rales  CVS/Heart  irregular rhythm  Abdomen:   Soft, nontender, ostomy in place with liquid stool  Extremities:  No peripheral edema  Neurologic:  Alert, able to follow some commands  Skin:  No acute rashes    Basic Metabolic Panel:  Recent Labs  Lab 11/06/19 1205 11/06/19 1205 11/07/19 0622 11/07/19 0622 11/08/19 0449 11/09/19 0525 11/10/19 0633  NA 142  --  142  --  139 140 137  K 4.6  --  3.8  --  3.6 3.7 4.0  CL 116*  --  111  --  106 107 107  CO2 18*  --  21*  --  25 22 23   GLUCOSE 84  --  75  --  86 80 80  BUN 24*  --  24*  --  30* 35* 37*  CREATININE 1.84*  --  1.86*  --  2.29* 2.51* 2.45*  CALCIUM 9.1   < > 9.1   < > 8.9 9.0 8.4*   < > = values in this interval not displayed.     CBC: Recent Labs  Lab 11/04/19 0432 11/06/19 1205 11/08/19 0449 11/10/19 0633  WBC 5.1 5.8 5.8 5.9  HGB 8.9* 9.8* 9.2* 8.9*  HCT 28.0* 31.1* 28.1* 28.0*  MCV 94.6 94.0 93.4 94.3  PLT 286 302 309 290     No results found for: HEPBSAG, HEPBSAB, HEPBIGM    Microbiology:  Recent  Results (from the past 240 hour(s))  C difficile quick scan w PCR reflex     Status: None   Collection Time: 10/31/19 12:21 PM   Specimen: Abdomen; Stool  Result Value Ref Range Status   C Diff antigen NEGATIVE NEGATIVE Final   C Diff toxin NEGATIVE NEGATIVE Final   C Diff interpretation No C. difficile detected.  Final    Comment: Performed at [facility], [place], [place] [address]  SARS CORONAVIRUS 2 (TAT 6-24 HRS) Nasopharyngeal Nasopharyngeal Swab     Status: None   Collection Time: 11/06/19  4:50 PM   Specimen: Nasopharyngeal Swab  Result Value Ref Range Status   SARS Coronavirus 2 NEGATIVE NEGATIVE Final    Comment: (NOTE) SARS-CoV-2 target nucleic acids are NOT DETECTED. The SARS-CoV-2 RNA is generally detectable in upper and lower respiratory specimens during the acute phase of infection. Negative results do not preclude SARS-CoV-2 infection, do not rule out co-infections with other pathogens, and should not be used as the sole basis for treatment or  other patient management decisions. Negative results must be combined with clinical observations, patient history, and epidemiological information. The expected result is Negative. Fact Sheet for Patients: SugarRoll.be Fact Sheet for Healthcare Providers: https://www.woods-mathews.com/ This test is not yet approved or cleared by the [place] FDA and  has been authorized for detection and/or diagnosis of SARS-CoV-2 by FDA under an Emergency Use Authorization (EUA). This EUA will remain  in effect (meaning this test can be used) for the duration of the COVID-19 declaration under Section 56 4(b)(1) of the Act, 21 U.S.C. section 360bbb-3(b)(1), unless the authorization is terminated or revoked sooner. Performed at [facility], [place] [address] [place], [place] [address]     Coagulation Studies: No results for input(s): LABPROT, INR in the last 72  hours.  Urinalysis: No results for input(s): COLORURINE, LABSPEC, PHURINE, GLUCOSEU, HGBUR, BILIRUBINUR, KETONESUR, PROTEINUR, UROBILINOGEN, NITRITE, LEUKOCYTESUR in the last 72 hours.  Invalid input(s): APPERANCEUR    Imaging: No results found.   Medications:   . sodium chloride    . sodium chloride 50 mL/hr at 11/10/19 0915   . calcium carbonate  0.5 tablet Oral Daily  . calcium-vitamin D  1 tablet Oral Daily  . carvedilol  3.125 mg Oral BID WC  . citalopram  40 mg Oral Daily  . enoxaparin (LOVENOX) injection  30 mg Subcutaneous Q24H  . feeding supplement (NEPRO CARB STEADY)  237 mL Oral TID BM  . ferrous sulfate  325 mg Oral BID WC  . fluticasone furoate-vilanterol  1 puff Inhalation Daily   And  . umeclidinium bromide  1 puff Inhalation Daily  . multivitamin with minerals  1 tablet Oral Daily  . pantoprazole  40 mg Oral BID AC  . sodium bicarbonate  1,300 mg Oral TID  . sodium chloride flush  3 mL Intravenous Q12H   sodium chloride, acetaminophen, albuterol, guaiFENesin-dextromethorphan, loperamide, ondansetron (ZOFRAN) IV, sodium chloride flush, sucralfate  Assessment/ Plan:  64 y.o. 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 Current episode of acute kidney injury likely related to contrast exposure as well as IV Lasix.  At the last visit she was found to have an M spike but given improving renal function renal biopsy was not recommended.   -Renal function about the same today  with a BUN of 37 and creatinine 2.45.  Continue IV fluid hydration 0.9 normal saline at a low rate of 50 cc/h for now.  #Chronic kidney disease stage IIIb Lab Results  Component Value Date   CREATININE 2.45 (H) 11/10/2019   Baseline creatinine of 1.7, GFR 36 from October 21, 2019 Underlying CKD is likely secondary to hypertension, atherosclerosis 11/19/2019 Office Visit Nephrology Anthonette Legato, MD      #Anemia of CKD. Hemoglobin down slightly to 8.9.  We may need to consider Epogen as an outpatient.    LOS: 4 Krrish Freund 1/26/202111:12 AM  [place], [place]  Note: This note was prepared with Dragon dictation. Any transcription errors are unintentional

## 2019-11-10 NOTE — Progress Notes (Signed)
[facility] Cardiology    SUBJECTIVE: Patient feels much better improved shortness of breath improved dyspnea borderline blood pressure with significant renal insufficiency but feels reasonably well ready to go home for further evaluation.  Denies any significant chest pain.  No significant leg edema   Vitals:   11/09/19 1618 11/09/19 2012 11/10/19 0413 11/10/19 0726  BP: 101/67 101/64 116/76 115/77  Pulse: 81 80 77 82  Resp: 18 19 20 16   Temp: 98.4 F (36.9 C) 98.7 F (37.1 C) 98.4 F (36.9 C) 98.5 F (36.9 C)  TempSrc: Oral Oral Oral Oral  SpO2: 95% 96% 93% 95%  Weight:   55.9 kg   Height:         Intake/Output Summary (Last 24 hours) at 11/10/2019 1337 Last data filed at 11/10/2019 0900 Gross per 24 hour  Intake 240 ml  Output --  Net 240 ml      PHYSICAL EXAM  General: Well developed, well nourished, in no acute distress HEENT:  Normocephalic and atramatic Neck:  No JVD.  Lungs: Clear bilaterally to auscultation and percussion. Heart: HRRR . Normal S1 and S2 without gallops or 2/6 sem murmurs.  Abdomen: Bowel sounds are positive, abdomen soft and non-tender  Msk:  Back normal, normal gait. Normal strength and tone for age. Extremities: No clubbing, cyanosis or edema.   Neuro: Alert and oriented X 3. Psych:  Good affect, responds appropriately   LABS: Basic Metabolic Panel: Recent Labs    11/09/19 0525 11/10/19 0633  NA 140 137  K 3.7 4.0  CL 107 107  CO2 22 23  GLUCOSE 80 80  BUN 35* 37*  CREATININE 2.51* 2.45*  CALCIUM 9.0 8.4*   Liver Function Tests: No results for input(s): AST, ALT, ALKPHOS, BILITOT, PROT, ALBUMIN in the last 72 hours. No results for input(s): LIPASE, AMYLASE in the last 72 hours. CBC: Recent Labs    11/08/19 0449 11/10/19 0633  WBC 5.8 5.9  HGB 9.2* 8.9*  HCT 28.1* 28.0*  MCV 93.4 94.3  PLT 309 290   Cardiac Enzymes: No results for input(s): CKTOTAL, CKMB, CKMBINDEX, TROPONINI in the last 72 hours. BNP: Invalid input(s):  POCBNP D-Dimer: No results for input(s): DDIMER in the last 72 hours. Hemoglobin A1C: No results for input(s): HGBA1C in the last 72 hours. Fasting Lipid Panel: No results for input(s): CHOL, HDL, LDLCALC, TRIG, CHOLHDL, LDLDIRECT in the last 72 hours. Thyroid Function Tests: No results for input(s): TSH, T4TOTAL, T3FREE, THYROIDAB in the last 72 hours.  Invalid input(s): FREET3 Anemia Panel: No results for input(s): VITAMINB12, FOLATE, FERRITIN, TIBC, IRON, RETICCTPCT in the last 72 hours.  No results found.   Echo EF 50 to 53% diastolic dysfunction as well as mild pulmonary hypertension  TELEMETRY: Normal sinus rhythm:  ASSESSMENT AND PLAN:  Active Problems:   CHF exacerbation (HCC) Acute on chronic diastolic heart failure Mild pulmonary hypertension Acute on chronic renal insufficiency stage III Shortness of breath  COPD GERD . Plan Continue current therapy Increase activity for mild ambulation regularly We will make Lasix and diuretic therapy as needed Increase Coreg to 6.25 twice a day on discharge to help with diastolic heart failure therapy Continue inhalers as necessary We will avoid ACE ARB or Entresto for now because of renal insufficiency Limit diuretic therapy as well because of renal insufficiency Have the patient follow-up with cardiology 1 to 2 weeks as an outpatient Also okay to have the patient follow-up with heart failure clinic in 1 month Agree with discharge  with in the next 24 hours    Yolonda Kida, MD 11/10/2019 1:37 PM

## 2019-11-11 DIAGNOSIS — E43 Unspecified severe protein-calorie malnutrition: Secondary | ICD-10-CM

## 2019-11-11 LAB — CBC
HCT: 28.3 % — ABNORMAL LOW (ref 36.0–46.0)
Hemoglobin: 9 g/dL — ABNORMAL LOW (ref 12.0–15.0)
MCH: 30.5 pg (ref 26.0–34.0)
MCHC: 31.8 g/dL (ref 30.0–36.0)
MCV: 95.9 fL (ref 80.0–100.0)
Platelets: 292 10*3/uL (ref 150–400)
RBC: 2.95 MIL/uL — ABNORMAL LOW (ref 3.87–5.11)
RDW: 19 % — ABNORMAL HIGH (ref 11.5–15.5)
WBC: 6 10*3/uL (ref 4.0–10.5)
nRBC: 0 % (ref 0.0–0.2)

## 2019-11-11 LAB — BASIC METABOLIC PANEL
Anion gap: 9 (ref 5–15)
BUN: 30 mg/dL — ABNORMAL HIGH (ref 8–23)
CO2: 22 mmol/L (ref 22–32)
Calcium: 8.4 mg/dL — ABNORMAL LOW (ref 8.9–10.3)
Chloride: 108 mmol/L (ref 98–111)
Creatinine, Ser: 2.17 mg/dL — ABNORMAL HIGH (ref 0.44–1.00)
GFR calc Af Amer: 27 mL/min — ABNORMAL LOW (ref >60)
GFR calc non Af Amer: 23 mL/min — ABNORMAL LOW (ref >60)
Glucose, Bld: 73 mg/dL (ref 70–99)
Potassium: 4.1 mmol/L (ref 3.5–5.1)
Sodium: 139 mmol/L (ref 135–145)

## 2019-11-11 MED ORDER — CARVEDILOL 6.25 MG PO TABS: 6.2500 mg | ORAL_TABLET | Freq: Two times a day (BID) | ORAL | 0 refills | Status: DC

## 2019-11-11 NOTE — Progress Notes (Signed)
Discharge instructions given to patient. All questions answered. All prescriptions sent to her pharmacy. Pt is waiting for her daughter.

## 2019-11-11 NOTE — Progress Notes (Signed)
[place], [place] 11/11/19  Subjective:  Patient status significantly improved. No shortness of breath at the moment. Renal function has improved. BUN down to 30 with a creatinine of 2.17.  Objective:  Vital signs in last 24 hours:  Temp:  [98.4 F (36.9 C)-98.7 F (37.1 C)] 98.6 F (37 C) (01/27 0725) Pulse Rate:  [74-78] 77 (01/27 0725) Resp:  [18-20] 18 (01/27 0725) BP: (98-124)/(62-81) 116/81 (01/27 0725) SpO2:  [96 %-97 %] 96 % (01/27 0725) Weight:  [56.6 kg] 56.6 kg (01/27 0439)  Weight change: 0.68 kg Filed Weights   11/09/19 0440 11/10/19 0413 11/11/19 0439  Weight: 53.7 kg 55.9 kg 56.6 kg    Intake/Output:    Intake/Output Summary (Last 24 hours) at 11/11/2019 1243 Last data filed at 11/10/2019 1700 Gross per 24 hour  Intake 480 ml  Output --  Net 480 ml     Physical Exam: General:  Laying in bed  HEENT  moist oral mucous membranes  Pulm/lungs  clear bilateral  CVS/Heart  irregular rhythm  Abdomen:   Soft, nontender, ostomy in place with liquid stool  Extremities:  No peripheral edema  Neurologic:  Alert, able to follow some commands  Skin:  No acute rashes    Basic Metabolic Panel:  Recent Labs  Lab 11/07/19 0622 11/07/19 0622 11/08/19 0449 11/08/19 0449 11/09/19 0525 11/10/19 0633 11/11/19 0559  NA 142  --  139  --  140 137 139  K 3.8  --  3.6  --  3.7 4.0 4.1  CL 111  --  106  --  107 107 108  CO2 21*  --  25  --  22 23 22   GLUCOSE 75  --  86  --  80 80 73  BUN 24*  --  30*  --  35* 37* 30*  CREATININE 1.86*  --  2.29*  --  2.51* 2.45* 2.17*  CALCIUM 9.1   < > 8.9   < > 9.0 8.4* 8.4*   < > = values in this interval not displayed.     CBC: Recent Labs  Lab 11/06/19 1205 11/08/19 0449 11/10/19 0633 11/11/19 0559  WBC 5.8 5.8 5.9 6.0  HGB 9.8* 9.2* 8.9* 9.0*  HCT 31.1* 28.1* 28.0* 28.3*  MCV 94.0 93.4 94.3 95.9  PLT 302 309 290 292     No results found for: HEPBSAG, HEPBSAB,  HEPBIGM    Microbiology:  Recent Results (from the past 240 hour(s))  SARS CORONAVIRUS 2 (TAT 6-24 HRS) Nasopharyngeal Nasopharyngeal Swab     Status: None   Collection Time: 11/06/19  4:50 PM   Specimen: Nasopharyngeal Swab  Result Value Ref Range Status   SARS Coronavirus 2 NEGATIVE NEGATIVE Final    Comment: (NOTE) SARS-CoV-2 target nucleic acids are NOT DETECTED. The SARS-CoV-2 RNA is generally detectable in upper and lower respiratory specimens during the acute phase of infection. Negative results do not preclude SARS-CoV-2 infection, do not rule out co-infections with other pathogens, and should not be used as the sole basis for treatment or other patient management decisions. Negative results must be combined with clinical observations, patient history, and epidemiological information. The expected result is Negative. Fact Sheet for Patients: SugarRoll.be Fact Sheet for Healthcare Providers: https://www.woods-mathews.com/ This test is not yet approved or cleared by the [place] FDA and  has been authorized for detection and/or diagnosis of SARS-CoV-2 by FDA under an Emergency Use Authorization (EUA). This EUA will remain  in effect (meaning  this test can be used) for the duration of the COVID-19 declaration under Section 56 4(b)(1) of the Act, 21 U.S.C. section 360bbb-3(b)(1), unless the authorization is terminated or revoked sooner. Performed at [facility], [place] [address], [place], [place] [address]     Coagulation Studies: No results for input(s): LABPROT, INR in the last 72 hours.  Urinalysis: No results for input(s): COLORURINE, LABSPEC, PHURINE, GLUCOSEU, HGBUR, BILIRUBINUR, KETONESUR, PROTEINUR, UROBILINOGEN, NITRITE, LEUKOCYTESUR in the last 72 hours.  Invalid input(s): APPERANCEUR    Imaging: No results found.   Medications:   . sodium chloride     . calcium carbonate  0.5 tablet Oral Daily  .  calcium-vitamin D  1 tablet Oral Daily  . carvedilol  6.25 mg Oral BID WC  . citalopram  40 mg Oral Daily  . dextromethorphan-guaiFENesin  1 tablet Oral BID  . enoxaparin (LOVENOX) injection  30 mg Subcutaneous Q24H  . feeding supplement (NEPRO CARB STEADY)  237 mL Oral TID BM  . ferrous sulfate  325 mg Oral BID WC  . fluticasone furoate-vilanterol  1 puff Inhalation Daily   And  . umeclidinium bromide  1 puff Inhalation Daily  . multivitamin with minerals  1 tablet Oral Daily  . pantoprazole  40 mg Oral BID AC  . sodium bicarbonate  1,300 mg Oral TID  . sodium chloride flush  3 mL Intravenous Q12H   sodium chloride, acetaminophen, albuterol, guaiFENesin-dextromethorphan, loperamide, ondansetron (ZOFRAN) IV, sodium chloride flush, sucralfate  Assessment/ Plan:  64 y.o. female with COPD, chronic systolic CHF, hypertension, anemia, GERD, depression, history of breast cancer treated with mastectomy, chemo, radiation;  lung cancer, history of colonic stricture status post hemi-colectomy with ostomy, lower esophageal cancer 2020 ongoing radiation admitted on 11/06/2019 for Shortness of breath [D40.81] Chronic systolic heart failure (HCC) [I50.22] CHF exacerbation ([place]) [I50.9] Acute on chronic congestive heart failure, unspecified heart failure type ([place]) [I50.9]  #Acute renal failure with history of M spike. Current episode of acute kidney injury likely related to contrast exposure as well as IV Lasix.  At the last visit she was found to have an M spike but given improving renal function renal biopsy was not recommended.   -Renal function has now improved.  BUN down to 30 with a creatinine 2.17.  We will continue to monitor renal parameters as an outpatient.  #Chronic kidney disease stage IIIb Lab Results  Component Value Date   CREATININE 2.17 (H) 11/11/2019   Baseline creatinine of 1.7, GFR 36 from October 21, 2019 Underlying CKD is likely secondary to hypertension,  atherosclerosis 11/19/2019 Office Visit Nephrology Anthonette Legato, MD      #Anemia of CKD. Hemoglobin up a bit to 9.0.  She is followed by hematology.  We may need to consider Epogen as an outpatient but no urgent indication at the moment.    LOS: 5 Danielle [name] 1/27/202112:43 PM  [place], [place]  Note: This note was prepared with Dragon dictation. Any transcription errors are unintentional

## 2019-11-11 NOTE — Progress Notes (Signed)
[facility] Cardiology    SUBJECTIVE: Patient feels much better with reduced shortness of breath no dyspnea denies any significant chest pain no blackout spells or syncope no nausea vomiting feels reasonably well somewhat improved no leg edema   Vitals:   11/10/19 1550 11/10/19 1920 11/11/19 0439 11/11/19 0725  BP: 107/76 98/62 124/80 116/81  Pulse: 75 78 74 77  Resp: 18 19 20 18   Temp: 98.7 F (37.1 C) 98.5 F (36.9 C) 98.4 F (36.9 C) 98.6 F (37 C)  TempSrc: Oral Oral Oral Oral  SpO2: 96% 96% 97% 96%  Weight:   56.6 kg   Height:         Intake/Output Summary (Last 24 hours) at 11/11/2019 1343 Last data filed at 11/10/2019 1700 Gross per 24 hour  Intake 240 ml  Output --  Net 240 ml      PHYSICAL EXAM  General: Well developed, well nourished, in no acute distress HEENT:  Normocephalic and atramatic Neck:  No JVD.  Lungs: Clear bilaterally to auscultation and percussion. Heart: HRRR . Normal S1 and S2 without gallops or murmurs.  Abdomen: Bowel sounds are positive, abdomen soft and non-tender  Msk:  Back normal, normal gait. Normal strength and tone for age. Extremities: No clubbing, cyanosis or edema.   Neuro: Alert and oriented X 3. Psych:  Good affect, responds appropriately   LABS: Basic Metabolic Panel: Recent Labs    11/10/19 0633 11/11/19 0559  NA 137 139  K 4.0 4.1  CL 107 108  CO2 23 22  GLUCOSE 80 73  BUN 37* 30*  CREATININE 2.45* 2.17*  CALCIUM 8.4* 8.4*   Liver Function Tests: No results for input(s): AST, ALT, ALKPHOS, BILITOT, PROT, ALBUMIN in the last 72 hours. No results for input(s): LIPASE, AMYLASE in the last 72 hours. CBC: Recent Labs    11/10/19 0633 11/11/19 0559  WBC 5.9 6.0  HGB 8.9* 9.0*  HCT 28.0* 28.3*  MCV 94.3 95.9  PLT 290 292   Cardiac Enzymes: No results for input(s): CKTOTAL, CKMB, CKMBINDEX, TROPONINI in the last 72 hours. BNP: Invalid input(s): POCBNP D-Dimer: No results for input(s): DDIMER in the last 72  hours. Hemoglobin A1C: No results for input(s): HGBA1C in the last 72 hours. Fasting Lipid Panel: No results for input(s): CHOL, HDL, LDLCALC, TRIG, CHOLHDL, LDLDIRECT in the last 72 hours. Thyroid Function Tests: No results for input(s): TSH, T4TOTAL, T3FREE, THYROIDAB in the last 72 hours.  Invalid input(s): FREET3 Anemia Panel: No results for input(s): VITAMINB12, FOLATE, FERRITIN, TIBC, IRON, RETICCTPCT in the last 72 hours.  No results found.   Echo preserved left ventricular function EF of 50-55%  TELEMETRY: Atrial fibrillation controlled response  ASSESSMENT AND PLAN:  Active Problems:   CHF exacerbation (HCC) Atrial fibrillation Shortness of breath Smoking COPD Acute on chronic congestive heart failure Hypoxemia Chronic renal insufficiency Hypertension Lymphedema Status post Covid 19 Breast cancer Status post chemo XRT . Plan Agree with telemetry Continue diuretic therapy for shortness of breath heart failure Consider inhalers for COPD Maintain hypertension management and control Supplemental oxygen as necessary Follow-up with pulmonary for status post Covid The patient follow-up with cardiology as an outpatient     Yolonda Kida, MD 11/11/2019 1:43 PM

## 2019-11-11 NOTE — Discharge Summary (Signed)
Physician Discharge Summary  Danielle [name] XVQ:[id number] DOB: [birth year] DOA: 11/06/2019  PCP: [facility] date: 11/06/2019 Discharge date: 11/11/2019  Admitted From: Home Disposition: Home  Recommendations for Outpatient Follow-up:  1. Follow up with PCP in 1-2 weeks   Home Health: No Equipment/Devices: None Discharge Condition: Stable CODE STATUS: Full Diet recommendation: Heart Healthy  Brief/Interim Summary: Danielle Carra Pennyis a 64 y.o.femalewith medical history significant ofhypertension, COPD, GERD, depression with anxiety, breast cancer (mastectomy, chemo and radiation therapy), lung cancer (radiation therapy), CHF with EF 35-40%, MGUS, CKD-3b, ileostomy,positive covid 19 Ag on 12/13 and positive covid 19 PCR on 12/30being admitted for CHF exacerbation  Patient was diuresed adequately however her creatinine began to rise.  Patient has baseline chronic kidney disease stage IIIb with creatinine normally 1.8.  Creatinine rose to 2.4.  Patient was exposed to IV loop diuretics as well as iodinated contrast.  Nephrology involved at this time.  Recommended holding diuresis and gentle hydration.  Patient was monitored and creatinine began to improve.  Discussed with nephrology consultant who cleared the patient for discharge and will follow patient's creatinine as an outpatient.  Volume status corrected.  Cleared for discharge by cardiology after return to euvolemic state.  Per nephrology recommendations no diuretics were prescribed on discharge  Discharge Diagnoses:  Active Problems:   CHF exacerbation (HCC)  Acute on chronic systolic CHF 2D echo on 0/06/3266 showed EF 35-40%. - hold Lasix per nephrology as kidney function has been worsening now, started gentle hydration  repeat echo 1/24 shows normal LV systolic function with EF of 50 to 55% Cardiology input appreciated, she is -1 L fluid balance Stable for discharge from cardiology standpoint With  follow-up as outpatient  Acute on chronic kidney diseasestage IIIb: -Creatinine jumped from 1.8->2.4.   Likely due to diuresis and dye which she received for CT on admission,  gentle hydration per nephro due to worsening renal function.   Creatinine improved after gentle IV hydration Nephrology recommends discharge without diuretics Will evaluate patient in the clinic regarding need for ongoing diuresis  Moderate COPD (chronic obstructive pulmonary disease) ([place]): -Continue bronchodilators.  Depression: No SI or HI -continue home meds  HTN (hypertension):Monitor while on Coreg  Anemia of chronic kidney disease: Hemoglobin9.8 s/p 1 packed RBC transfusion on 1/19 - getting Epogen as an outpatient per hematology Danielle [name] - s/p ileostomy secondary to colon ulceration in2019 at the hepatic flexure. 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 Currently s/p definitive radiation [dec 7th 2020]. Clinically stable.  Oncology is planning PET scan for follow-up next 4 to 6 weeks  GERD (gastroesophageal reflux disease):  -PPI  Severe protein calorie malnourishment: recently was diagnosed with distal esophageal cancer she follows with [facility]/Danielle [name]. Continue w/ nutritional supplements   Generalized weakness: PT/OT - no skilled needs  Discharge Instructions  Discharge Instructions    Amb Referral to HF Clinic   Complete by: As directed    Diet - low sodium heart healthy   Complete by: As directed    Increase activity slowly   Complete by: As directed      Allergies as of 11/11/2019   No Known Allergies     Medication List    STOP taking these medications   furosemide 20 MG tablet Commonly known as: LASIX     TAKE these medications   acetaminophen 325 MG tablet Commonly known as: TYLENOL Take  2 tablets (650 mg total) by mouth every 6 (six) hours as  needed for mild pain (or Fever >/= 101).   albuterol 108 (90 Base) MCG/ACT inhaler Commonly known as: VENTOLIN HFA Inhale 2 puffs into the lungs every 6 (six) hours as needed for wheezing or shortness of breath.   calcium carbonate 500 MG chewable tablet Commonly known as: TUMS - dosed in mg elemental calcium Chew 0.5 tablets (100 mg of elemental calcium total) by mouth daily.   calcium-vitamin D 250-125 MG-UNIT tablet Commonly known as: OSCAL WITH D Take 1 tablet by mouth daily.   carvedilol 6.25 MG tablet Commonly known as: COREG Take 1 tablet (6.25 mg total) by mouth 2 (two) times daily with a meal. What changed:   medication strength  how much to take   citalopram 20 MG tablet Commonly known as: CELEXA Take 40 mg by mouth daily.   feeding supplement (NEPRO CARB STEADY) Liqd Take 237 mLs by mouth 3 (three) times daily between meals.   ferrous sulfate 325 (65 FE) MG tablet Take 1 tablet (325 mg total) by mouth 2 (two) times daily with a meal.   Fluticasone-Umeclidin-Vilant 100-62.5-25 MCG/INH Aepb Inhale 1 puff into the lungs daily.   guaiFENesin-dextromethorphan 100-10 MG/5ML syrup Commonly known as: ROBITUSSIN DM Take 5 mLs by mouth every 4 (four) hours as needed for cough.   loperamide 2 MG tablet Commonly known as: Imodium A-D Take 1 tablet (2 mg total) by mouth as needed for diarrhea or loose stools.   multivitamin with minerals Tabs tablet Take 1 tablet by mouth daily.   pantoprazole 40 MG tablet Commonly known as: Protonix Take 1 tablet (40 mg total) by mouth 2 (two) times daily before a meal.   sodium bicarbonate 650 MG tablet Take 2 tablets (1,300 mg total) by mouth 3 (three) times daily.   sucralfate 1 GM/10ML suspension Commonly known as: CARAFATE Take 10 mLs (1 g total) by mouth 4 (four) times daily.      Follow-up Information    [place] On 11/16/2019.   Specialty: Cardiology Why: at 9:00am. Enter  through the [facility] entrance Contact information: [place] [phone]       Danielle Kida, MD. [place] on 11/27/2019.   Specialties: Cardiology, Internal Medicine Why: Patient needs follow up with Dr. Clayborn [name] in 1-2 weeks...Marland KitchenMarland Kitchenappointment at 11:30am Contact information: [place] [address] [place], [place], [place] on 11/19/2019.   Specialty: Nephrology Why: Patient needs follow up with Dr. Holley Raring in 1 week........virtual appointment at [employer] information: [place] [phone]          No Known Allergies  Consultations:  [facility]phrology-[facility]  Cardiology-[facility]   Procedures/Studies: CT ABDOMEN PELVIS WO CONTRAST  Result Date: 10/31/2019 CLINICAL DATA:  Abdominal cramping EXAM: CT ABDOMEN AND PELVIS WITHOUT CONTRAST TECHNIQUE: Multidetector CT imaging of the abdomen and pelvis was performed following the standard protocol without IV contrast. COMPARISON:  10/29/2019 FINDINGS: Lower chest: Lung bases are clear. Heart size is normal. Coronary artery calcifications evident. Hepatobiliary: Stable size and appearance of small low-density lesions within the liver, which may reflect cysts versus hemangiomas. Gallbladder appears unremarkable. No gallstone. No biliary dilatation. Pancreas: Unremarkable. No pancreatic ductal dilatation or surrounding inflammatory changes. Spleen: Normal in size without focal abnormality. Adrenals/Urinary Tract: Adrenal glands are unremarkable. Kidneys  are normal, without renal calculi, focal lesion, or hydronephrosis. Bladder is unremarkable. 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 Enteric contrast is present within small bowel loops and extending into the ileostomy. No evidence of bowel obstruction. Sigmoid  diverticulosis. No focal bowel wall thickening or inflammatory changes are seen. Vascular/Lymphatic: Extensive atherosclerotic calcification of the abdominal aorta and its branches. No abdominopelvic lymphadenopathy. Reproductive: Uterus and bilateral adnexa are unremarkable. Other: No free fluid within the abdomen or pelvis. No pneumoperitoneum. Nodular focus within the subcutaneous soft tissues of the upper abdomen right of midline (series 2, image 11) and right gluteal region (series 2, image 30), which may reflect injection related changes. Musculoskeletal: Stable mild lumbar compression deformities. No new or acute osseous findings. IMPRESSION: 1. No acute findings in the abdomen or pelvis. 2. Extensive postsurgical changes of the bowel. No evidence of bowel obstruction. 3. Sigmoid diverticulosis without evidence of acute diverticulitis. 4. Extensive atherosclerosis. Electronically Signed   By: Davina Poke D.O.   On: 10/31/2019 16:29   DG Chest 2 View  Result Date: 11/06/2019 CLINICAL DATA:  Chest pain. Shortness of breath. COVID-19 positive. EXAM: CHEST - 2 VIEW COMPARISON:  Chest x-ray 11/03/2019, 06/19/2019.  CT 11/19/2018. FINDINGS: Mediastinum and hilar structures normal. Stable changes of scarring right upper lung. Bibasilar interstitial prominence with nodularity. These changes may be related pneumonitis. Follow-up exam to demonstrate clearing suggested. New small bilateral pleural effusions, right side greater than left. No pneumothorax. IMPRESSION: 1. Bibasilar interstitial prominence with nodularity. These changes may be related to pneumonitis. Follow-up exams suggested to demonstrate resolution. 2. New small bilateral pleural effusions, right side greater than left. 3.   Persistent changes of scarring right upper lung. Electronically Signed   By: Marcello Moores  Register   On: 11/06/2019 13:04   CT Angio Chest PE W and/or Wo Contrast  Result Date: 11/06/2019 CLINICAL DATA:  Shortness of breath,  recent COVID EXAM: CT ANGIOGRAPHY CHEST WITH CONTRAST TECHNIQUE: Multidetector CT imaging of the chest was performed using the standard protocol during bolus administration of intravenous contrast. Multiplanar CT image reconstructions and MIPs were obtained to evaluate the vascular anatomy. CONTRAST:  72mL OMNIPAQUE IOHEXOL 350 MG/ML SOLN COMPARISON:  11/19/2018 FINDINGS: Cardiovascular: Satisfactory opacification of the pulmonary arteries to the segmental level. No evidence of pulmonary embolism. Normal heart size. No pericardial effusion. Coronary artery calcification. Calcified plaque is present along the thoracic aorta. Mediastinum/Nodes: There is no mediastinal, hilar, or axillary adenopathy. Lungs/Pleura: Small to moderate right pleural effusion. Greater confluent appearance of anterior right upper lobe opacity. Similar appearance of contiguous more superior right upper lobe opacity. Mild bibasilar atelectasis. Moderate emphysema. Upper Abdomen: No acute abnormality. Musculoskeletal: No acute abnormality. Review of the MIP images confirms the above findings. IMPRESSION: No acute pulmonary embolism. Small to moderate right pleural effusion. More confluent appearance of anterior right upper lobe treated mass. Emphysema and aortic atherosclerosis. Electronically Signed   By: Macy Mis M.D.   On: 11/06/2019 15:50   DG Chest Port 1 View  Result Date: 11/03/2019 CLINICAL DATA:  Shortness of breath EXAM: PORTABLE CHEST 1 VIEW COMPARISON:  09/26/2019 FINDINGS: Cardiac shadow is within normal limits. Aortic calcifications are again seen. Diffuse prominent interstitial markings are again seen. Scarring in the right upper lobe is noted stable from the prior exam. Mild bibasilar opacities are noted right greater than left consistent with the given clinical history. No bony abnormality is noted. IMPRESSION: Bibasilar opacities consistent with the given clinical  history of COVID-19 positivity. Chronic scarring in  the right upper lobe. Electronically Signed   By: Inez Catalina M.D.   On: 11/03/2019 13:23   DG Abd Portable 1V  Result Date: 10/16/2019 CLINICAL DATA:  Generalized abdominal pain EXAM: PORTABLE ABDOMEN - 1 VIEW COMPARISON:  07/20/2018 FINDINGS: Stomach is distended with air. No obstructive changes are seen. No free air is noted. Diffuse aortic calcifications are noted without aneurysmal dilatation. Tubal ligation clips are seen. Degenerative changes of lumbar spine are noted. IMPRESSION: No acute abnormality seen. Electronically Signed   By: Inez Catalina M.D.   On: 10/16/2019 11:33   ECHOCARDIOGRAM COMPLETE  Result Date: 11/08/2019   ECHOCARDIOGRAM REPORT   Patient Name:   KAYSLEE FUREY [facility] Date of Exam: 11/07/2019 Medical Rec #:  [id number]       Height:       66.0 in Accession #:    [id number]      Weight:       123.3 lb Date of Birth:  July 21, 1956       BSA:          1.63 m Patient Age:    57 years        BP:           120/76 mmHg Patient Gender: F               HR:           101 bpm. Exam Location:  [facility] Procedure: 2D Echo Indications:     DYSPNEA 786.09  History:         Patient has prior history of Echocardiogram examinations, most                  recent 04/21/2018. CHF, COPD, Signs/Symptoms:Syncope; Risk                  Factors:Hypertension.  Sonographer:     Avanell Shackleton Referring Phys:  [id number] VIPUL [facility] Diagnosing Phys: Danielle Kida MD IMPRESSIONS  1. Left ventricular ejection fraction, by visual estimation, is 50 to 55%. The left ventricle has low normal function. Left ventricular septal wall thickness was mildly increased. Mildly increased left ventricular posterior wall thickness. There is mildly increased left ventricular hypertrophy.  2. Left ventricular diastolic parameters are consistent with Grade I diastolic dysfunction (impaired relaxation).  3. Small to moderate inferior myocardial infarction.  4. The left ventricle demonstrates regional wall motion abnormalities.  5. Global right  ventricle has normal systolic function.The right ventricular size is normal. No increase in right ventricular wall thickness.  6. Left atrial size was normal.  7. Right atrial size was normal.  8. The mitral valve is normal in structure. No evidence of mitral valve regurgitation.  9. The tricuspid valve is normal in structure. 10. The tricuspid valve is normal in structure. Tricuspid valve regurgitation is mild. 11. The aortic valve is normal in structure. Aortic valve regurgitation is not visualized. Mild aortic valve sclerosis without stenosis. 12. The pulmonic valve was grossly normal. Pulmonic valve regurgitation is not visualized. 13. Moderately elevated pulmonary artery systolic pressure. FINDINGS  Left Ventricle: Left ventricular ejection fraction, by visual estimation, is 50 to 55%. The left ventricle has low normal function. The left ventricle demonstrates regional wall motion abnormalities. Mildly increased left ventricular posterior wall thickness. There is mildly increased left ventricular hypertrophy. Concentric left ventricular hypertrophy. Left ventricular diastolic parameters are consistent with Grade I diastolic dysfunction (impaired relaxation). There is evidence of a small  to moderate inferior myocardial infarction. Right Ventricle: The right ventricular size is normal. No increase in right ventricular wall thickness. Global RV systolic function is has normal systolic function. The tricuspid regurgitant velocity is 2.82 m/s, and with an assumed right atrial pressure  of 10 mmHg, the estimated right ventricular systolic pressure is moderately elevated at 41.8 mmHg. Left Atrium: Left atrial size was normal in size. Right Atrium: Right atrial size was normal in size Pericardium: There is no evidence of pericardial effusion. Mitral Valve: The mitral valve is normal in structure. No evidence of mitral valve regurgitation. Tricuspid Valve: The tricuspid valve is normal in structure. Tricuspid valve  regurgitation is mild. Aortic Valve: The aortic valve is normal in structure. Aortic valve regurgitation is not visualized. Mild aortic valve sclerosis is present, with no evidence of aortic valve stenosis. Aortic valve mean gradient measures 10.0 mmHg. Aortic valve peak gradient measures 17.7 mmHg. Aortic valve area, by VTI measures 1.14 cm. Pulmonic Valve: The pulmonic valve was grossly normal. Pulmonic valve regurgitation is not visualized. Pulmonic regurgitation is not visualized. Aorta: The aortic root is normal in size and structure. IAS/Shunts: No atrial level shunt detected by color flow Doppler.  LEFT VENTRICLE PLAX 2D LVIDd:         3.51 cm  Diastology LVIDs:         2.54 cm  LV e' lateral:   9.36 cm/s LV PW:         1.15 cm  LV E/e' lateral: 6.3 LV IVS:        0.96 cm  LV e' medial:    5.55 cm/s LVOT diam:     1.90 cm  LV E/e' medial:  10.6 LV SV:         28 ml LV SV Index:   17.40 LVOT Area:     2.84 cm  RIGHT VENTRICLE             IVC RV S prime:     11.30 cm/s  IVC diam: 1.50 cm LEFT ATRIUM             Index       RIGHT ATRIUM           Index LA diam:        4.30 cm 2.64 cm/m  RA Area:     13.40 cm LA Vol (A2C):   49.1 ml 30.16 ml/m RA Volume:   31.10 ml  19.10 ml/m LA Vol (A4C):   35.0 ml 21.50 ml/m LA Biplane Vol: 41.9 ml 25.73 ml/m  AORTIC VALVE AV Area (Vmax):    1.05 cm AV Area (Vmean):   1.03 cm AV Area (VTI):     1.14 cm AV Vmax:           210.20 cm/s AV Vmean:          147.600 cm/s AV VTI:            0.412 m AV Peak Grad:      17.7 mmHg AV Mean Grad:      10.0 mmHg LVOT Vmax:         77.60 cm/s LVOT Vmean:        53.800 cm/s LVOT VTI:          0.166 m LVOT/AV VTI ratio: 0.40  AORTA Ao Root diam: 3.20 cm MITRAL VALVE                        TRICUSPID  VALVE MV Area (PHT): 3.72 cm             TR Peak grad:   31.8 mmHg MV PHT:        59.16 msec           TR Vmax:        309.00 cm/s MV Decel Time: 204 msec MV E velocity: 58.70 cm/s 103 cm/s  SHUNTS MV A velocity: 66.90 cm/s 70.3 cm/s  Systemic VTI:  0.17 m MV E/A ratio:  0.88       1.5       Systemic Diam: 1.90 cm  Danielle Kida MD Electronically signed by Danielle Kida MD Signature Date/Time: 11/08/2019/9:35:56 AM    Final    CT Renal Stone Study  Result Date: 10/29/2019 CLINICAL DATA:  Acute renal failure. Right-sided back pain and dizziness. Lung cancer and breast cancer. EXAM: CT ABDOMEN AND PELVIS WITHOUT CONTRAST TECHNIQUE: Multidetector CT imaging of the abdomen and pelvis was performed following the standard protocol without IV contrast. COMPARISON:  09/27/2019 renal ultrasound. Abdominopelvic CT of 04/29/2019 FINDINGS: Lower chest: Clear lung bases. Normal heart size without pericardial or pleural effusion. Multivessel coronary artery atherosclerosis. Hepatobiliary: Hepatic cysts. Normal gallbladder, without biliary ductal dilatation. Pancreas: Mild pancreatic atrophy, without duct dilatation or acute inflammation. Spleen: Normal in size, without focal abnormality. Adrenals/Urinary Tract: Normal adrenal glands. No renal calculi or hydronephrosis. No hydroureter or ureteric calculi. No bladder calculi. Stomach/Bowel: Normal stomach, without wall thickening. Moderate amount of stool within the rectum. Scattered colonic diverticula. Left abdominal mucous fistula. Right abdominal end colostomy. Normal terminal ileum. Normal small bowel caliber. Vascular/Lymphatic: Advanced aortic and branch vessel atherosclerosis. No abdominopelvic adenopathy. Reproductive: Normal uterus and adnexa. Other: No significant free fluid. Mild pelvic floor laxity. No evidence of omental or peritoneal disease. Nonspecific subcutaneous nodule about the anterior right abdominal wall at 8 mm on 14/2, new. There is also subcutaneous nodule superficial the right gluteals of 11 mm on 29/2, new. Musculoskeletal: Mild osteopenia. L3 and L5 mild superior endplate compression deformities, similar to on the prior. This presumes a transitional S1 vertebral body.  IMPRESSION: 1.  No urinary tract calculi or hydronephrosis. 2. Otherwise, low sensitivity exam secondary to stone study technique. 3. L3 and L5 mild superior endplate compression deformities, chronic. 4. Extensive surgical changes within the colon, without acute complication. 5. Coronary artery atherosclerosis. Aortic Atherosclerosis (ICD10-I70.0). 6. New subcutaneous nodularity about the abdomen and right gluteal region. Correlate with sites of injections and consider physical exam. Electronically Signed   By: Abigail Miyamoto M.D.   On: 10/29/2019 14:06    (Echo, Carotid, EGD, Colonoscopy, ERCP)    Subjective: Seen and examined on the day of discharge No specific complaints Creatinine improving  Discharge Exam: Vitals:   11/11/19 0439 11/11/19 0725  BP: 124/80 116/81  Pulse: 74 77  Resp: 20 18  Temp: 98.4 F (36.9 C) 98.6 F (37 C)  SpO2: 97% 96%   Vitals:   11/10/19 1550 11/10/19 1920 11/11/19 0439 11/11/19 0725  BP: 107/76 98/62 124/80 116/81  Pulse: 75 78 74 77  Resp: 18 19 20 18   Temp: 98.7 F (37.1 C) 98.5 F (36.9 C) 98.4 F (36.9 C) 98.6 F (37 C)  TempSrc: Oral Oral Oral Oral  SpO2: 96% 96% 97% 96%  Weight:   56.6 kg   Height:        General: Pt is alert, awake, not in acute distress Cardiovascular: RRR, S1/S2 +, no rubs, no gallops  Respiratory: CTA bilaterally, no wheezing, no rhonchi Abdominal: Soft, NT, ND, bowel sounds + Extremities: no edema, no cyanosis    The results of significant diagnostics from this hospitalization (including imaging, microbiology, ancillary and laboratory) are listed below for reference.     Microbiology: Recent Results (from the past 240 hour(s))  SARS CORONAVIRUS 2 (TAT 6-24 HRS) Nasopharyngeal Nasopharyngeal Swab     Status: None   Collection Time: 11/06/19  4:50 PM   Specimen: Nasopharyngeal Swab  Result Value Ref Range Status   SARS Coronavirus 2 NEGATIVE NEGATIVE Final    Comment: (NOTE) SARS-CoV-2 target nucleic acids  are NOT DETECTED. The SARS-CoV-2 RNA is generally detectable in upper and lower respiratory specimens during the acute phase of infection. Negative results do not preclude SARS-CoV-2 infection, do not rule out co-infections with other pathogens, and should not be used as the sole basis for treatment or other patient management decisions. Negative results must be combined with clinical observations, patient history, and epidemiological information. The expected result is Negative. Fact Sheet for Patients: SugarRoll.be Fact Sheet for Healthcare Providers: https://www.woods-mathews.com/ This test is not yet approved or cleared by the [place] FDA and  has been authorized for detection and/or diagnosis of SARS-CoV-2 by FDA under an Emergency Use Authorization (EUA). This EUA will remain  in effect (meaning this test can be used) for the duration of the COVID-19 declaration under Section 56 4(b)(1) of the Act, 21 U.S.C. section 360bbb-3(b)(1), unless the authorization is terminated or revoked sooner. Performed at [facility], [place] [address], [place], [place] [address]      Labs: BNP (last 3 results) Recent Labs    12/22/18 0347 10/29/19 1923 11/06/19 1405  BNP 80.0 85.0 5,053.9*   Basic Metabolic Panel: Recent Labs  Lab 11/07/19 0622 11/08/19 0449 11/09/19 0525 11/10/19 0633 11/11/19 0559  NA 142 139 140 137 139  K 3.8 3.6 3.7 4.0 4.1  CL 111 106 107 107 108  CO2 21* 25 22 23 22   GLUCOSE 75 86 80 80 73  BUN 24* 30* 35* 37* 30*  CREATININE 1.86* 2.29* 2.51* 2.45* 2.17*  CALCIUM 9.1 8.9 9.0 8.4* 8.4*   Liver Function Tests: No results for input(s): AST, ALT, ALKPHOS, BILITOT, PROT, ALBUMIN in the last 168 hours. No results for input(s): LIPASE, AMYLASE in the last 168 hours. No results for input(s): AMMONIA in the last 168 hours. CBC: Recent Labs  Lab 11/06/19 1205 11/08/19 0449 11/10/19 0633 11/11/19 0559  WBC  5.8 5.8 5.9 6.0  HGB 9.8* 9.2* 8.9* 9.0*  HCT 31.1* 28.1* 28.0* 28.3*  MCV 94.0 93.4 94.3 95.9  PLT 302 309 290 292   Cardiac Enzymes: No results for input(s): CKTOTAL, CKMB, CKMBINDEX, TROPONINI in the last 168 hours. BNP: Invalid input(s): POCBNP CBG: No results for input(s): GLUCAP in the last 168 hours. D-Dimer No results for input(s): DDIMER in the last 72 hours. Hgb A1c No results for input(s): HGBA1C in the last 72 hours. Lipid Profile No results for input(s): CHOL, HDL, LDLCALC, TRIG, CHOLHDL, LDLDIRECT in the last 72 hours. Thyroid function studies No results for input(s): TSH, T4TOTAL, T3FREE, THYROIDAB in the last 72 hours.  Invalid input(s): FREET3 Anemia work up No results for input(s): VITAMINB12, FOLATE, FERRITIN, TIBC, IRON, RETICCTPCT in the last 72 hours. Urinalysis    Component Value Date/Time   COLORURINE YELLOW (A) 10/29/2019 1929   APPEARANCEUR HAZY (A) 10/29/2019 1929   APPEARANCEUR Clear 12/06/2013 2245   LABSPEC 1.020 10/29/2019 1929  LABSPEC 1.015 12/06/2013 2245   PHURINE 5.0 10/29/2019 1929   GLUCOSEU NEGATIVE 10/29/2019 1929   GLUCOSEU Negative 12/06/2013 2245   HGBUR NEGATIVE 10/29/2019 [place] NEGATIVE 10/29/2019 1929   BILIRUBINUR Negative 12/06/2013 2245   [place] 10/29/2019 [place] NEGATIVE 10/29/2019 1929   NITRITE NEGATIVE 10/29/2019 1929   LEUKOCYTESUR SMALL (A) 10/29/2019 1929   LEUKOCYTESUR Negative 12/06/2013 2245   Sepsis Labs Invalid input(s): PROCALCITONIN,  WBC,  LACTICIDVEN Microbiology Recent Results (from the past 240 hour(s))  SARS CORONAVIRUS 2 (TAT 6-24 HRS) Nasopharyngeal Nasopharyngeal Swab     Status: None   Collection Time: 11/06/19  4:50 PM   Specimen: Nasopharyngeal Swab  Result Value Ref Range Status   SARS Coronavirus 2 NEGATIVE NEGATIVE Final    Comment: (NOTE) SARS-CoV-2 target nucleic acids are NOT DETECTED. The SARS-CoV-2 RNA is generally detectable in upper and  lower respiratory specimens during the acute phase of infection. Negative results do not preclude SARS-CoV-2 infection, do not rule out co-infections with other pathogens, and should not be used as the sole basis for treatment or other patient management decisions. Negative results must be combined with clinical observations, patient history, and epidemiological information. The expected result is Negative. Fact Sheet for Patients: SugarRoll.be Fact Sheet for Healthcare Providers: https://www.woods-mathews.com/ This test is not yet approved or cleared by the [place] FDA and  has been authorized for detection and/or diagnosis of SARS-CoV-2 by FDA under an Emergency Use Authorization (EUA). This EUA will remain  in effect (meaning this test can be used) for the duration of the COVID-19 declaration under Section 56 4(b)(1) of the Act, 21 U.S.C. section 360bbb-3(b)(1), unless the authorization is terminated or revoked sooner. Performed at [facility], [place] [address] [place], [place] [address]      Time coordinating discharge: Over 30 minutes  SIGNED:   Sidney Ace, MD  [facility] 11/11/2019, 2:51 PM Pager   If 7PM-7AM, please contact night-coverage [email or web address] Password [email or web address]

## 2019-11-14 IMAGING — CR DG ABDOMEN ACUTE W/ 1V CHEST
3 series · 3 of 3 positions shown · non-contrast
Comparison: Chest x-ray 05/15/2018 and KUB 05/10/2018

CLINICAL DATA: Abdominal pain periumbilical beginning this morning.
Nausea and vomiting. Right lung cancer.

EXAM:
DG ABDOMEN ACUTE W/ 1V CHEST

[chest pa]
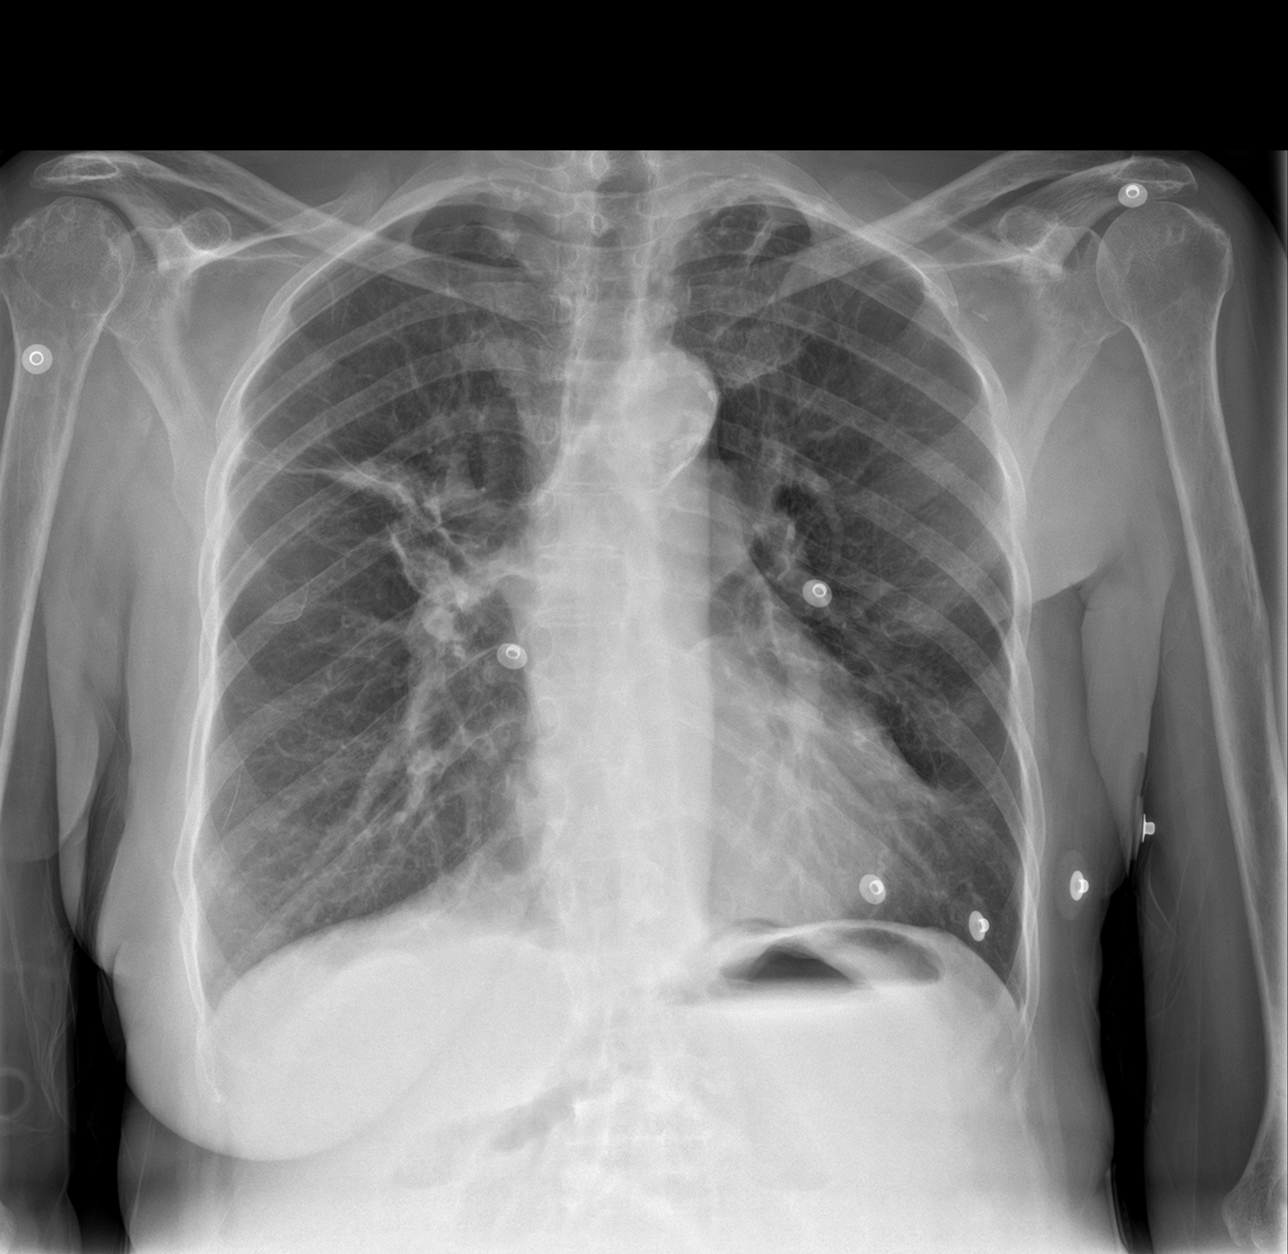

[abdomen erect]
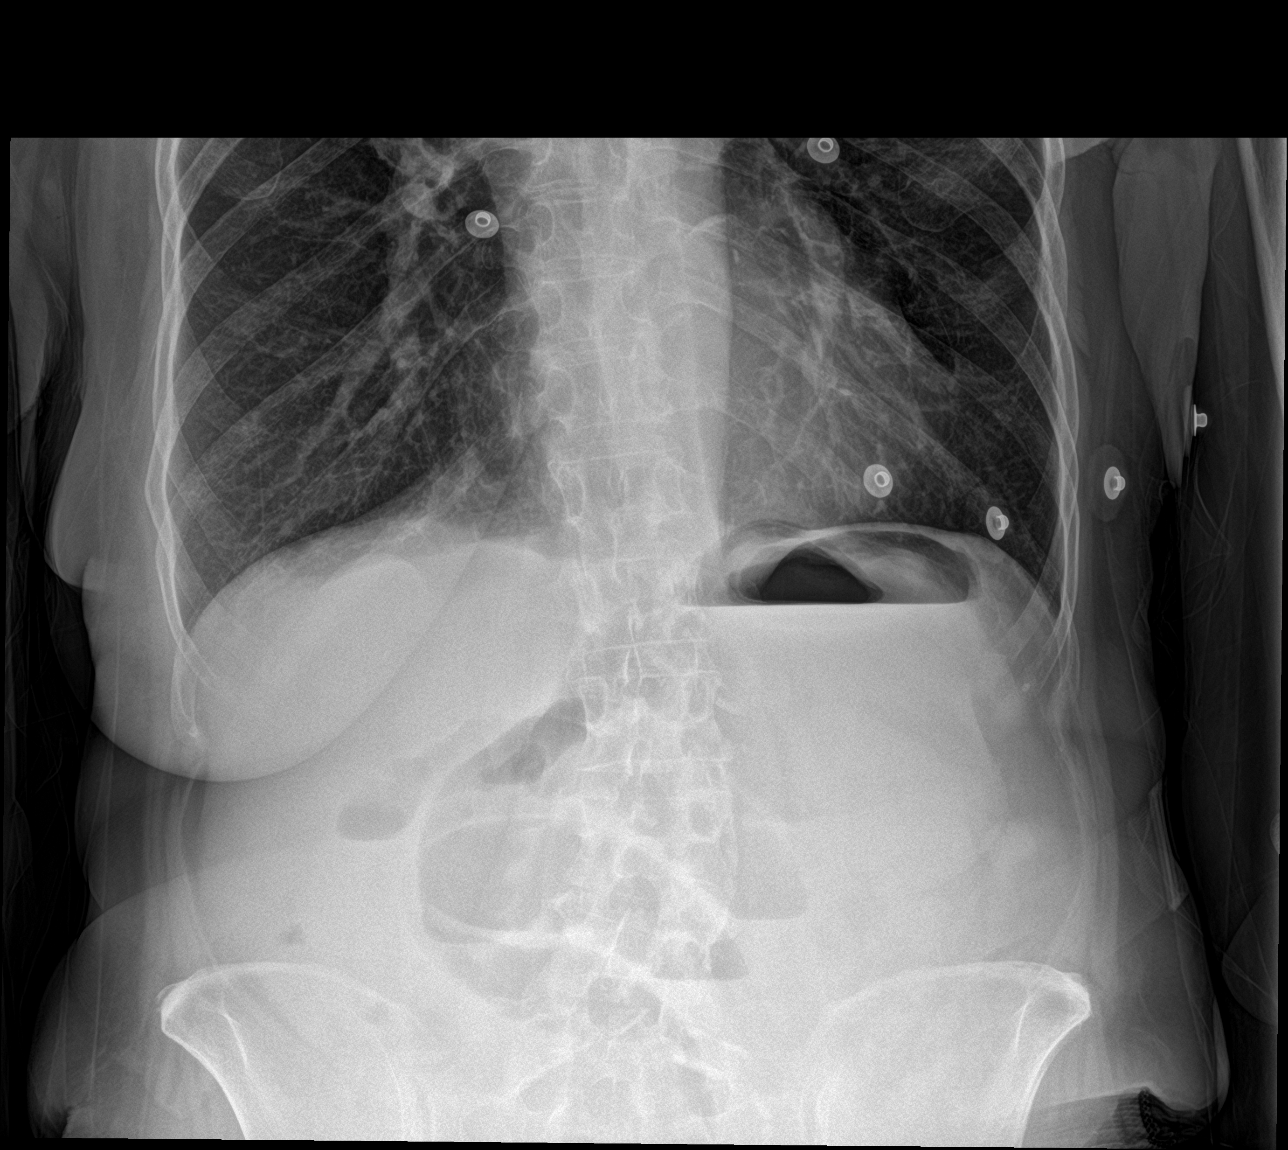

[abdomen supine]
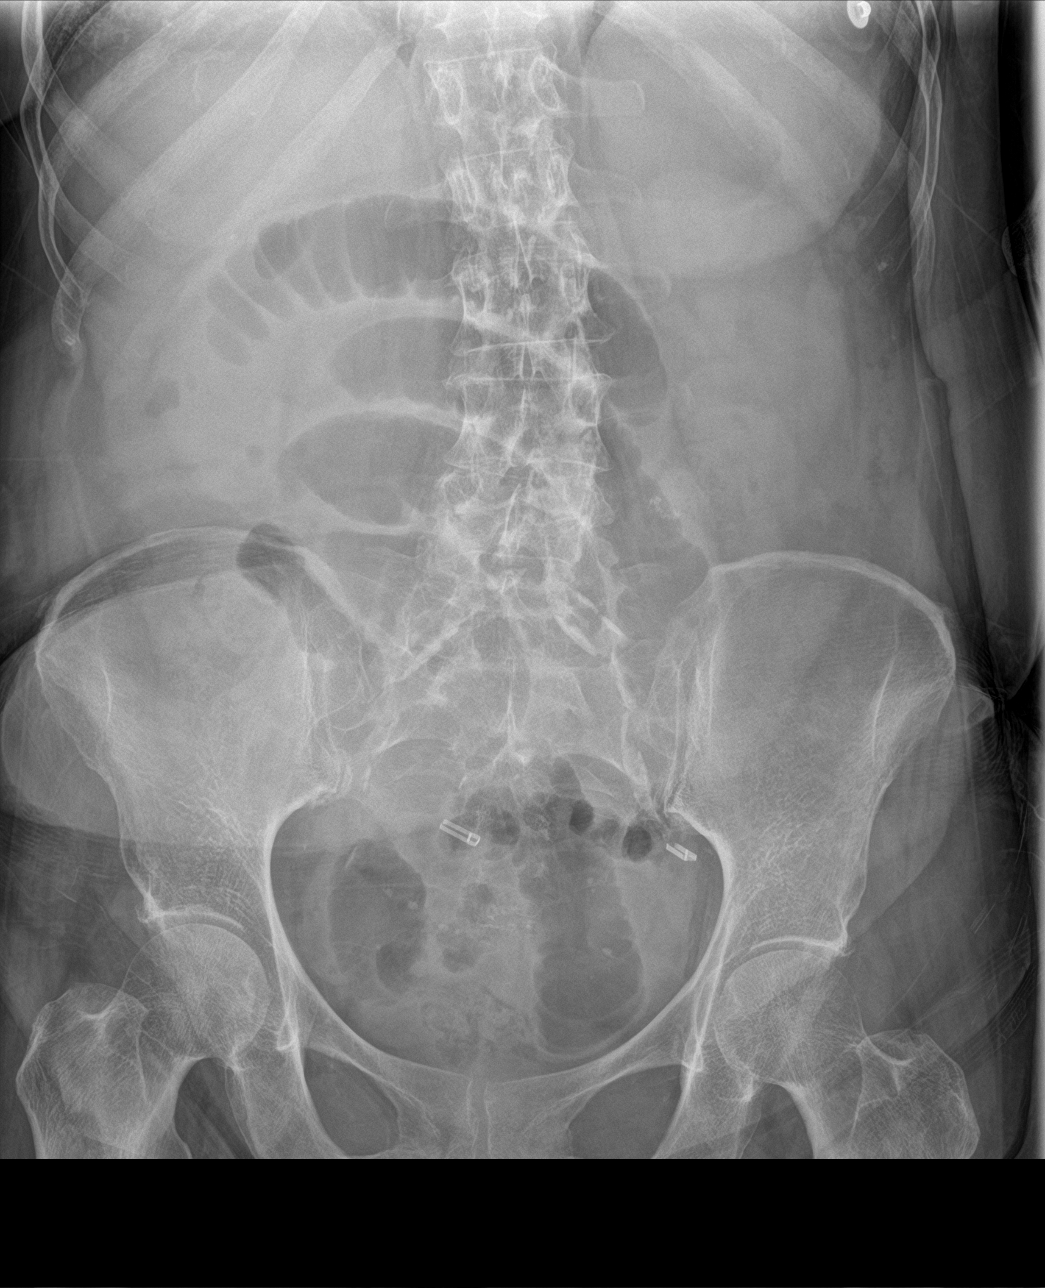

[3 of 3 positions shown; findings below may reference images not displayed]

FINDINGS: Lungs are adequately inflated with right upper lobe/perihilar
scarring. Cardiomediastinal silhouette and remainder of the chest is
unchanged.

Abdominopelvic images demonstrate several air-filled dilated central
small bowel loops measuring up to 3.4 cm in diameter. No free
peritoneal air. Paucity of bowel gas within the colon. Remainder of
the exam is unchanged.
IMPRESSION: Several air-filled dilated small bowel loops with paucity of gas
within the colon. Findings likely due to early/partial small bowel
obstruction and less likely ileus.

No acute cardiopulmonary disease.

Right upper lobe/perihilar scarring.

## 2019-11-14 IMAGING — CT CT ABD-PELV W/ CM
2 of 5 series · 16 of 46 positions shown, 18 images · IV contrast (APPLIED)
Comparison: Abdominal series earlier same day; PET/CT 06/20/2018;
CT abdomen pelvis 05/11/2018

CLINICAL DATA: Patient status post hemicolectomy for ischemic
colitis. History of lung cancer and breast cancer.

EXAM:
CT ABDOMEN AND PELVIS WITH CONTRAST
TECHNIQUE: Multidetector CT imaging of the abdomen and pelvis was performed
using the standard protocol following bolus administration of
intravenous contrast.
CONTRAST:  75mL OMNIPAQUE IOHEXOL 300 MG/ML  SOLN

[Series 2: routine abd/pel with · axial · 0.64mm/px · z∈[-609,-284]mm · 13 of 73 slices shown, 15 images]
[im 4/73  soft-tissue]
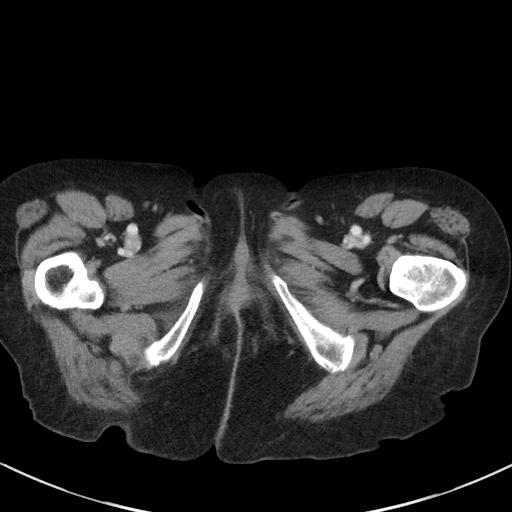
[im 4/73  bone]
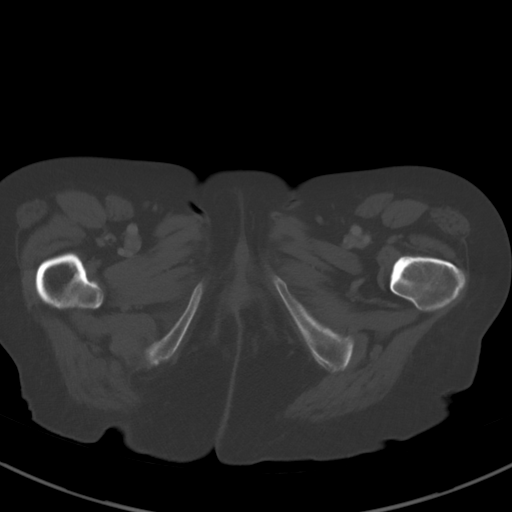
[im 12/73  soft-tissue]
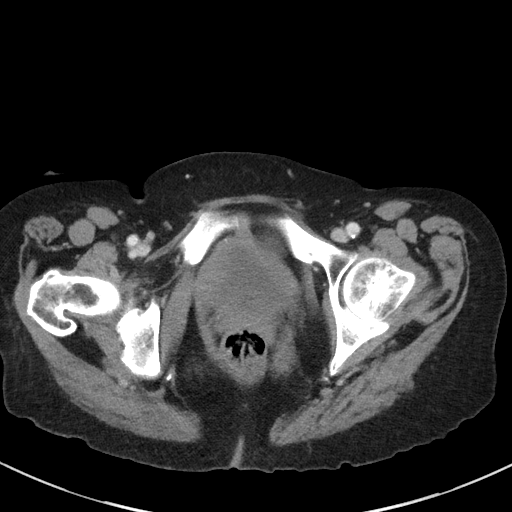
[im 16/73  soft-tissue]
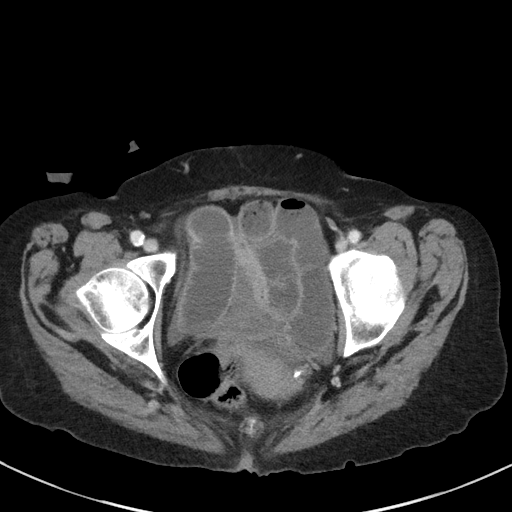
[im 19/73  soft-tissue]
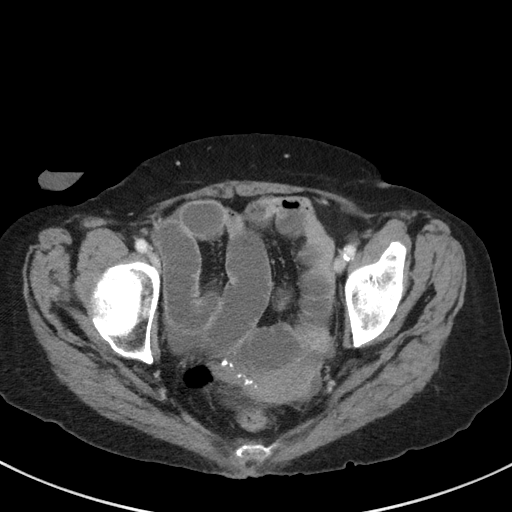
[im 27/73  soft-tissue]
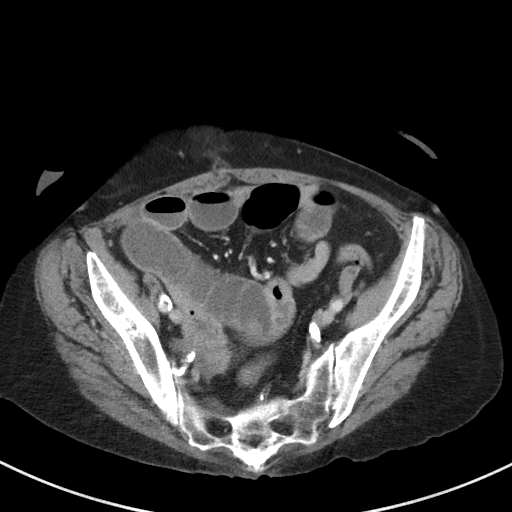
[im 31/73  soft-tissue]
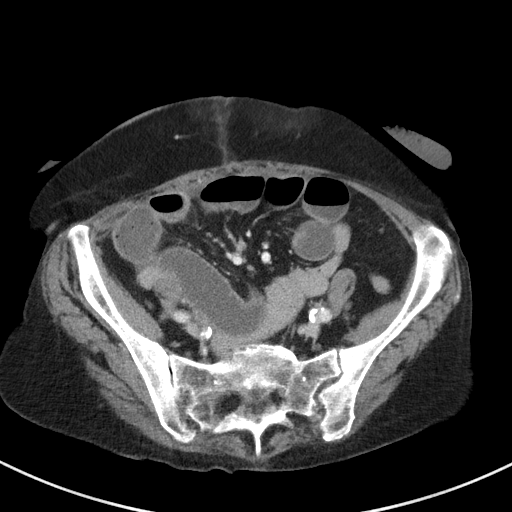
[im 38/73  soft-tissue]
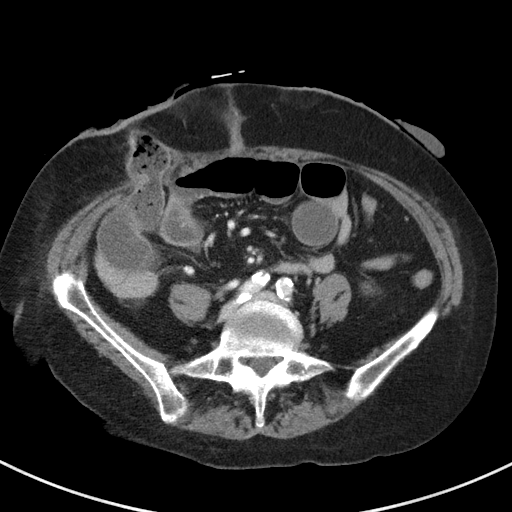
[im 42/73  soft-tissue]
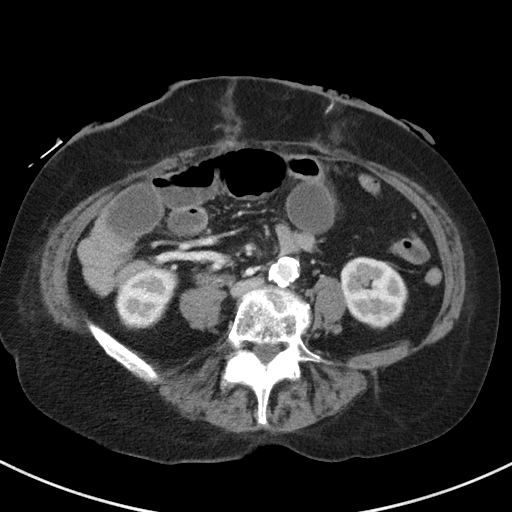
[im 46/73  soft-tissue]
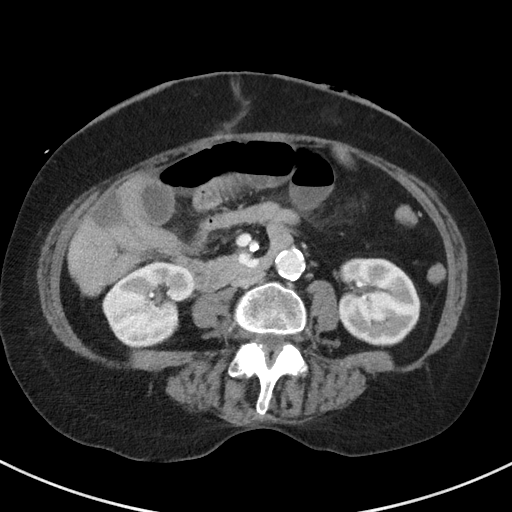
[im 46/73  bone]
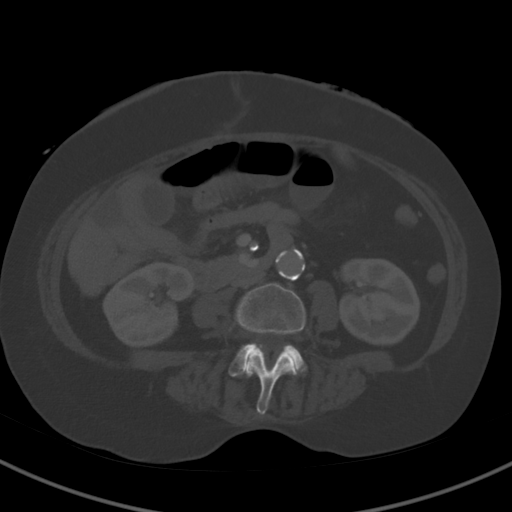
[im 54/73  soft-tissue]
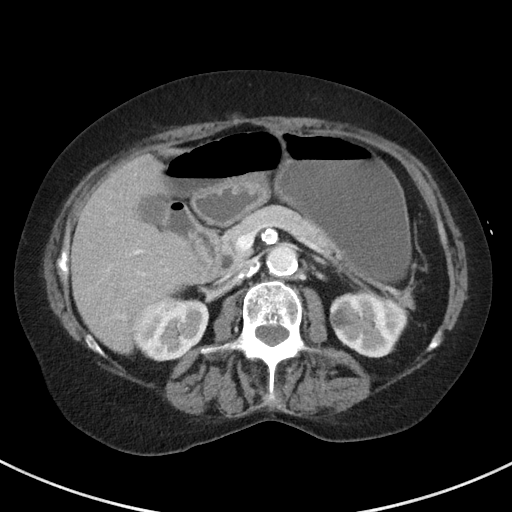
[im 57/73  soft-tissue]
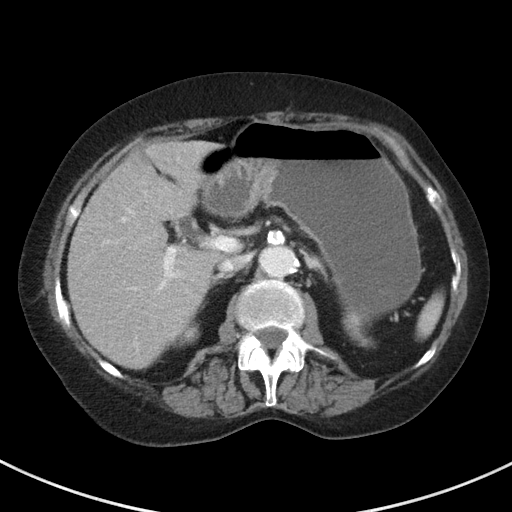
[im 61/73  soft-tissue]
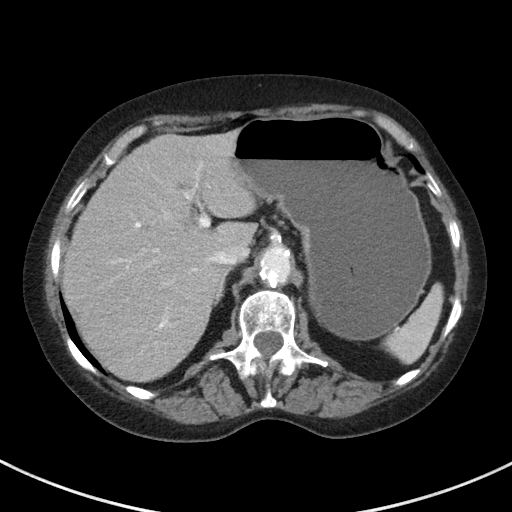
[im 69/73  soft-tissue]
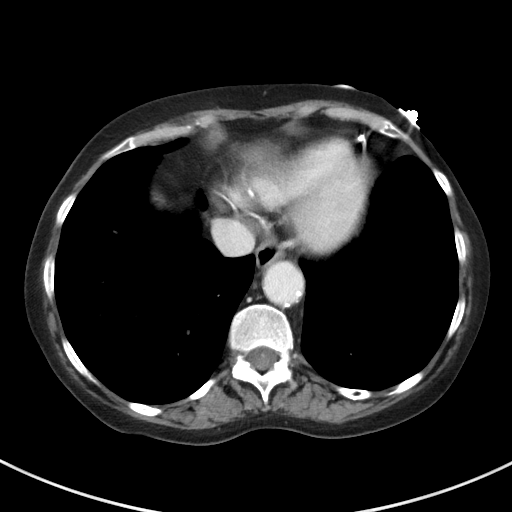

[Series 5: coronal st · coronal · 0.62mm/px · 3 of 90 slices shown]
[im 30/90  soft-tissue]
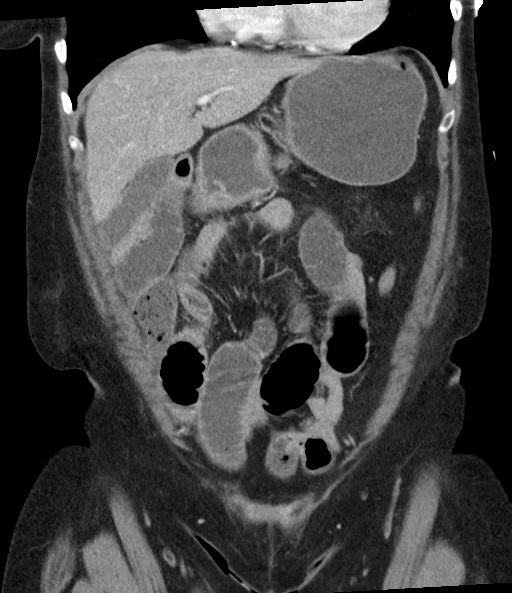
[im 40/90  soft-tissue]
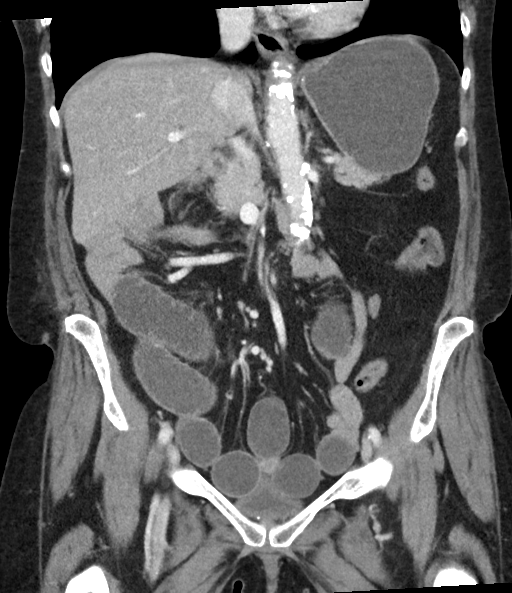
[im 50/90  soft-tissue]
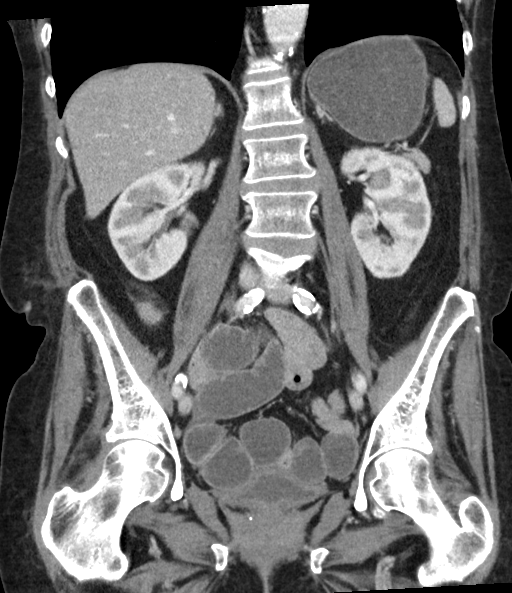

[16 of 46 positions shown; findings below may reference images not displayed]

FINDINGS: Lower chest: Normal heart size. Lung bases are clear. No pleural
effusion.

Hepatobiliary: Liver is normal in size and contour. Unchanged
subcentimeter too small to characterize low-attenuation lesion
hepatic dome (image 10; series 2). Gallbladder is unremarkable.

Pancreas: Unremarkable

Spleen: Unremarkable

Adrenals/Urinary Tract: Adrenal glands are normal. Kidneys enhance
symmetrically with contrast. Unchanged cyst superior pole left
kidney. Urinary bladder is decompressed.

Stomach/Bowel: The stomach is fluid-filled and distended. The mid
and distal small bowel is dilated and fluid-filled to the level of
the right lower quadrant ileostomy. Small bowel loops measure up to
3.3 cm. Fecalization of the contents within the distal small bowel.
Additionally, there is abrupt transition between decompressed mid
small bowel and dilated mid to distal small bowel within the pelvis
(image 53; series 5). Descending colon with mucous fistula in the
left hemiabdomen.

Vascular/Lymphatic: Extensive peripheral calcified atherosclerotic
plaque involving the abdominal aorta. No retroperitoneal
lymphadenopathy.

Reproductive: Uterus and adnexal structures unremarkable.

Other: Moderate volume free fluid in the pelvis.

Musculoskeletal: Midline laparotomy incision. Lumbar spine
degenerative changes. No aggressive or acute appearing osseous
lesions.
IMPRESSION: 1. Fairly long segment fluid-filled and dilated loop of small bowel
beginning at the transition within the pelvis and remaining dilated
to the level of the ileostomy within the right anterior abdomen.
Findings are concerning for obstruction. As there appears to be
transition at both the proximal and distal aspect of the dilated
distal small bowel, the possibility of a long segment closed loop
obstruction is not entirely excluded. Moderate volume free fluid
within the pelvis.

## 2019-11-14 IMAGING — DX DG ABDOMEN 1V
1 series · 1 of 1 positions shown · non-contrast
Comparison: Chest radiograph 06/29/2018

CLINICAL DATA: Evaluate NG tube placement.

EXAM:
ABDOMEN - 1 VIEW

[abdomen supine]
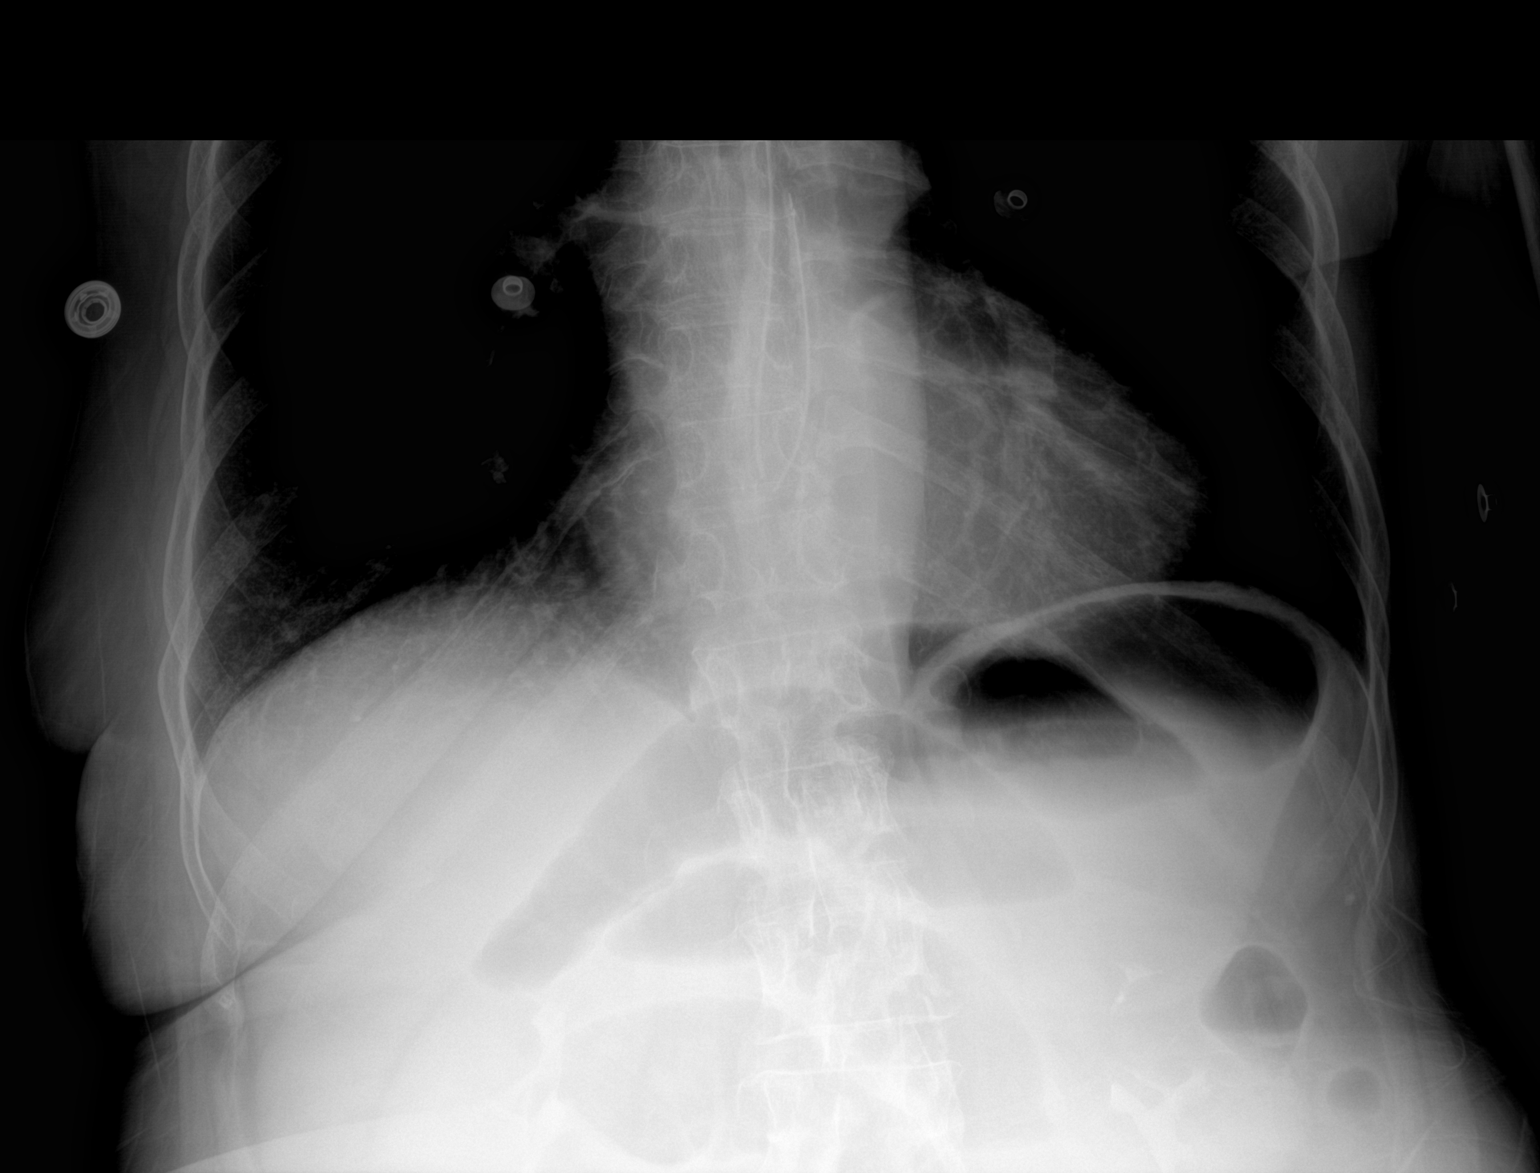

[1 of 1 positions shown; findings below may reference images not displayed]

FINDINGS: Enteric tube is coiled within the esophagus. Multiple gaseous
distended loops of bowel within the upper abdomen.
IMPRESSION: Enteric tube coiled within the esophagus. Recommend repeat
positioning.

These results will be called to the ordering clinician or
representative by the Radiologist Assistant, and communication
documented in the PACS or zVision Dashboard.

## 2019-11-14 NOTE — Progress Notes (Signed)
Patient ID: Danielle [name], female    DOB: 1956-03-11, 64 y.o.   MRN: [id number]  HPI  Danielle [name] is a 64 y/o female with a history of breast/ lung/ esophageal cancer, DM, HTN, COPD, anxiety, depression, lymphedema, previous tobacco use and chronic heart failure.   Echo report from 11/07/19 reviewed and showed an EF of 50-55% along with mild TR and moderately elevated PA pressure. Echo report from 04/21/18 reviewed and showed an EF of 35-40% along with mild AS/ trivial AR and moderate MR.   Admitted 11/06/19 due to acute on chronic heart failure. Cardiology consult obtained. Initially needed IV lasix which was then subsequently held due to worsening renal disease. Discharged after 5 days. Admitted 10/29/19 due to back pain and abdominal cramps. Oncology and nephrology consults obtained. Found to have worsening kidney function. IVF's given. Discharged after 6 days. Admitted twice December 2020.    She presents today for a follow-up visit with a chief complaint of minimal shortness of breath upon moderate exertion. She describes this as chronic in nature having been present for several years and is better since most recent hospital discharge. She has associated chest tightness, ankle swelling and headaches along with this. She denies any difficulty sleeping, dizziness, abdominal distention, palpitations, chest pain, cough, fatigue or weight gain.   Tested COVID + 10/14/2019  Past Medical History:  Diagnosis Date  . Anxiety   . Arthritis   . Breast cancer ([place]) 2000  . Cancer ([place]) left    breast cancer 2000, chemo tx's with total mastectomy and lymph nodes resected.   . Cancer of right lung ([place]) 02/21/2016   rad tx's.   . CHF (congestive heart failure) ([place])   . COPD (chronic obstructive pulmonary disease) ([place])   . Dependence on supplemental oxygen   . Depression   . Heart murmur   . Hypertension   . Lung nodule   . Lymphedema   . Personal history of chemotherapy   . Personal history of  radiation therapy   . Shortness of breath dyspnea    with exertion  . Status post chemotherapy 2001   left breast cancer  . Status post radiation therapy 2001   left breast cancer   Past Surgical History:  Procedure Laterality Date  . Breast Biospy Left    [facility]  . BREAST SURGERY    . COLONOSCOPY N/A 04/30/2018   Procedure: COLONOSCOPY;  Surgeon: Virgel Manifold, MD;  Location: [facility] ENDOSCOPY;  Service: Endoscopy;  Laterality: N/A;  . COLONOSCOPY N/A 07/22/2018   Procedure: COLONOSCOPY;  Surgeon: Virgel Manifold, MD;  Location: [facility] ENDOSCOPY;  Service: Endoscopy;  Laterality: N/A;  . DILATION AND CURETTAGE OF UTERUS    . ELECTROMAGNETIC NAVIGATION BROCHOSCOPY Right 04/11/2016   Procedure: ELECTROMAGNETIC NAVIGATION BRONCHOSCOPY;  Surgeon: Vilinda Boehringer, MD;  Location: [facility] ORS;  Service: Cardiopulmonary;  Laterality: Right;  . ESOPHAGOGASTRODUODENOSCOPY N/A 07/22/2018   Procedure: ESOPHAGOGASTRODUODENOSCOPY (EGD);  Surgeon: Virgel Manifold, MD;  Location[facility] ENDOSCOPY;  Service: Endoscopy;  Laterality: N/A;  . ESOPHAGOGASTRODUODENOSCOPY (EGD) WITH PROPOFOL N/A 05/07/2018   Procedure: ESOPHAGOGASTRODUODENOSCOPY (EGD) WITH PROPOFOL;  Surgeon: Lucilla Lame, MD;  Location: [facility] ENDOSCOPY;  Service: Endoscopy;  Laterality: N/A;  . ESOPHAGOGASTRODUODENOSCOPY (EGD) WITH PROPOFOL N/A 04/24/2019   Procedure: ESOPHAGOGASTRODUODENOSCOPY (EGD) WITH PROPOFOL;  Surgeon: Jonathon Bellows, MD;  Location: [facility] ENDOSCOPY;  Service: Gastroenterology;  Laterality: N/A;  . EUS N/A 05/07/2019   Procedure: FULL UPPER ENDOSCOPIC ULTRASOUND (EUS) RADIAL;  Surgeon: Jola Schmidt, MD;  Location: [facility]  ENDOSCOPY;  Service: Endoscopy;  Laterality: N/A;  . history of colonoscopy]    . ILEOSCOPY N/A 07/22/2018   Procedure: ILEOSCOPY THROUGH STOMA;  Surgeon: Virgel Manifold, MD;  Location: [facility] ENDOSCOPY;  Service: Endoscopy;  Laterality: N/A;  . ILEOSTOMY    . ILEOSTOMY N/A 09/08/2018   Procedure: ILEOSTOMY  REVISION POSSIBLE CREATION;  Surgeon: Herbert Pun, MD;  Location: [facility] ORS;  Service: General;  Laterality: N/A;  . ILEOSTOMY CLOSURE N/A 08/15/2018   Procedure: DILATION OF ILEOSTOMY STRICTURE;  Surgeon: Herbert Pun, MD;  Location: [facility] ORS;  Service: General;  Laterality: N/A;  . LAPAROTOMY Right 05/04/2018   Procedure: EXPLORATORY LAPAROTOMY right colectomy right and left ostomy;  Surgeon: Herbert Pun, MD;  Location: [facility] ORS;  Service: General;  Laterality: Right;  . LUNG BIOPSY    . MASTECTOMY Left    2000, [facility]  . ROTATOR CUFF REPAIR Right    [facility]   Family History  Problem Relation Age of Onset  . Breast cancer Mother 33  . Cancer Mother        Breast   . Cirrhosis Father   . Breast cancer Paternal Aunt 46  . Cancer Maternal Aunt        Breast    Social History   Tobacco Use  . Smoking status: Former Smoker    Packs/day: 0.50    Years: 20.00    Pack years: 10.00    Types: Cigarettes    Quit date: 07/02/2012    Years since quitting: 7.3  . Smokeless tobacco: Current User    Types: Snuff  . Tobacco comment: quit 2014  Substance Use Topics  . Alcohol use: Not Currently    Comment: Occasionally   No Known Allergies  Prior to Admission medications   Medication Sig Start Date End Date Taking? Authorizing Provider  acetaminophen (TYLENOL) 325 MG tablet Take 2 tablets (650 mg total) by mouth every 6 (six) hours as needed for mild pain (or Fever >/= 101). 12/24/18  Yes Gouru, Illene Silver, MD  albuterol (PROVENTIL HFA;VENTOLIN HFA) 108 (90 Base) MCG/ACT inhaler Inhale 2 puffs into the lungs every 6 (six) hours as needed for wheezing or shortness of breath. 04/24/18  Yes Demetrios Loll, MD  calcium carbonate (TUMS - DOSED IN MG ELEMENTAL CALCIUM) 500 MG chewable tablet Chew 0.5 tablets (100 mg of elemental calcium total) by mouth daily. 11/05/19  Yes Enzo Bi, MD  calcium-vitamin D (OSCAL WITH D) 250-125 MG-UNIT tablet Take 1 tablet by mouth daily.   Yes  [provider]  carvedilol (COREG) 6.25 MG tablet Take 1 tablet (6.25 mg total) by mouth 2 (two) times daily with a meal. 11/11/19 12/11/19 Yes Sreenath, Sudheer B, MD  citalopram (CELEXA) 20 MG tablet Take 40 mg by mouth daily.    Yes [provider]  ferrous sulfate 325 (65 FE) MG tablet Take 1 tablet (325 mg total) by mouth 2 (two) times daily with a meal. 07/02/18  Yes [name], MD  Fluticasone-Umeclidin-Vilant 100-62.5-25 MCG/INH AEPB Inhale 1 puff into the lungs daily.  01/07/19  Yes [provider]  guaiFENesin-dextromethorphan (ROBITUSSIN DM) 100-10 MG/5ML syrup Take 5 mLs by mouth every 4 (four) hours as needed for cough.   Yes [provider]  loperamide (IMODIUM A-D) 2 MG tablet Take 1 tablet (2 mg total) by mouth as needed for diarrhea or loose stools. 10/04/18  Yes Herbert Pun, MD  Multiple Vitamin (MULTIVITAMIN WITH MINERALS) TABS tablet Take 1 tablet by mouth daily. 06/06/18  Yes Fritzi Mandes, MD  Nutritional Supplements (FEEDING SUPPLEMENT, NEPRO CARB STEADY,) LIQD Take 237 mLs by mouth 3 (three) times daily between meals. 10/21/19  Yes Loletha Grayer, MD  pantoprazole (PROTONIX) 40 MG tablet Take 1 tablet (40 mg total) by mouth 2 (two) times daily before a meal. 07/23/18  Yes Sudini, Srikar, MD  sodium bicarbonate 650 MG tablet Take 2 tablets (1,300 mg total) by mouth 3 (three) times daily. 10/21/19  Yes Wieting, Richard, MD  sucralfate (CARAFATE) 1 GM/10ML suspension Take 10 mLs (1 g total) by mouth 4 (four) times daily. 08/18/19  Yes Noreene Filbert, MD    Review of Systems  Constitutional: Negative for appetite change and fatigue.  HENT: Negative for congestion, rhinorrhea and sore throat.   Eyes: Negative.   Respiratory: Positive for chest tightness (at times) and shortness of breath (with moderate exertion). Negative for cough.   Cardiovascular: Positive for leg swelling (around ankles). Negative for chest pain and  palpitations.  Gastrointestinal: Negative for abdominal distention and abdominal pain.  Endocrine: Negative.   Genitourinary: Negative.   Musculoskeletal: Negative for back pain and neck pain.  Skin: Negative.   Allergic/Immunologic: Negative.   Neurological: Positive for headaches. Negative for dizziness and light-headedness.  Hematological: Negative for adenopathy. Does not bruise/bleed easily.  Psychiatric/Behavioral: Negative for dysphoric mood and sleep disturbance (trouble falling asleep; sleeping on 2 pillows). The patient is not nervous/anxious.    Vitals:   11/16/19 0854  BP: (!) 147/94  Pulse: 90  Resp: 16  SpO2: 92%  Weight: 121 lb 3.2 oz (55 kg)  Height: 5\' 6"  (1.676 m)   Wt Readings from Last 3 Encounters:  11/16/19 121 lb 3.2 oz (55 kg)  11/11/19 124 lb 12.8 oz (56.6 kg)  11/04/19 128 lb 12.8 oz (58.4 kg)   Lab Results  Component Value Date   CREATININE 2.17 (H) 11/11/2019   CREATININE 2.45 (H) 11/10/2019   CREATININE 2.51 (H) 11/09/2019    Physical Exam Vitals and nursing note reviewed.  Constitutional:      Appearance: She is well-developed.  HENT:     Head: Normocephalic and atraumatic.  Neck:     Vascular: No JVD.  Cardiovascular:     Rate and Rhythm: Normal rate and regular rhythm.  Pulmonary:     Effort: Pulmonary effort is normal. No respiratory distress.     Breath sounds: No wheezing or rales.  Abdominal:     General: There is no distension.     Palpations: Abdomen is soft.  Musculoskeletal:     Cervical back: Normal range of motion and neck supple.     Right lower leg: No tenderness. No edema.     Left lower leg: No tenderness. No edema.  Skin:    General: Skin is warm and dry.  Neurological:     Mental Status: She is alert and oriented to person, place, and time.  Psychiatric:        Behavior: Behavior normal.    Assessment & Plan:  1: Chronic heart failure with now preserved ejection fraction- - NYHA class II - euvolemic  today - weighing daily and she was reminded to call for an overnight weight gain of >2 pounds or a weekly weight gain of >5 pounds - weight down 12 pounds from last time she was here ~ 6 months ago - saw cardiology [name]) 04/13/2019 and has appt with Dr. Clayborn Bigness 11/27/19 - saw pulmonology Raul Del) 09/03/2019 - has had 4 admissions in the last 2 months  so will refer to paramedicine - BNP 11/06/19 was 15000.0 - did receive her flu vaccine for this season  2: HTN- - BP mildly elevated today; currently off of her diuretic due to renal disease - sees PCP (@ [facility]) & has an appointment scheduled with them - BMP 11/11/19 reviewed and showed sodium 139, potassium 4.1, creatinine 2.17 and GFR 23 - sees nephrology Holley Raring) 11/19/19  3: Squamous cell carcinoma of esophagus- - saw oncology Rogue Bussing) 10/29/19  Patient did not bring her medications nor a list. Each medication was verbally reviewed with the patient and she was encouraged to bring the bottles to every visit to confirm accuracy of list.  Return in 6 months or sooner for any questions/problems before then.

## 2019-11-15 IMAGING — DX DG ABDOMEN 2V
2 series · 2 of 2 positions shown · non-contrast
Comparison: 06/29/2018

CLINICAL DATA: Small bowel obstruction.

EXAM:
ABDOMEN - 2 VIEW

[abdomen erect]
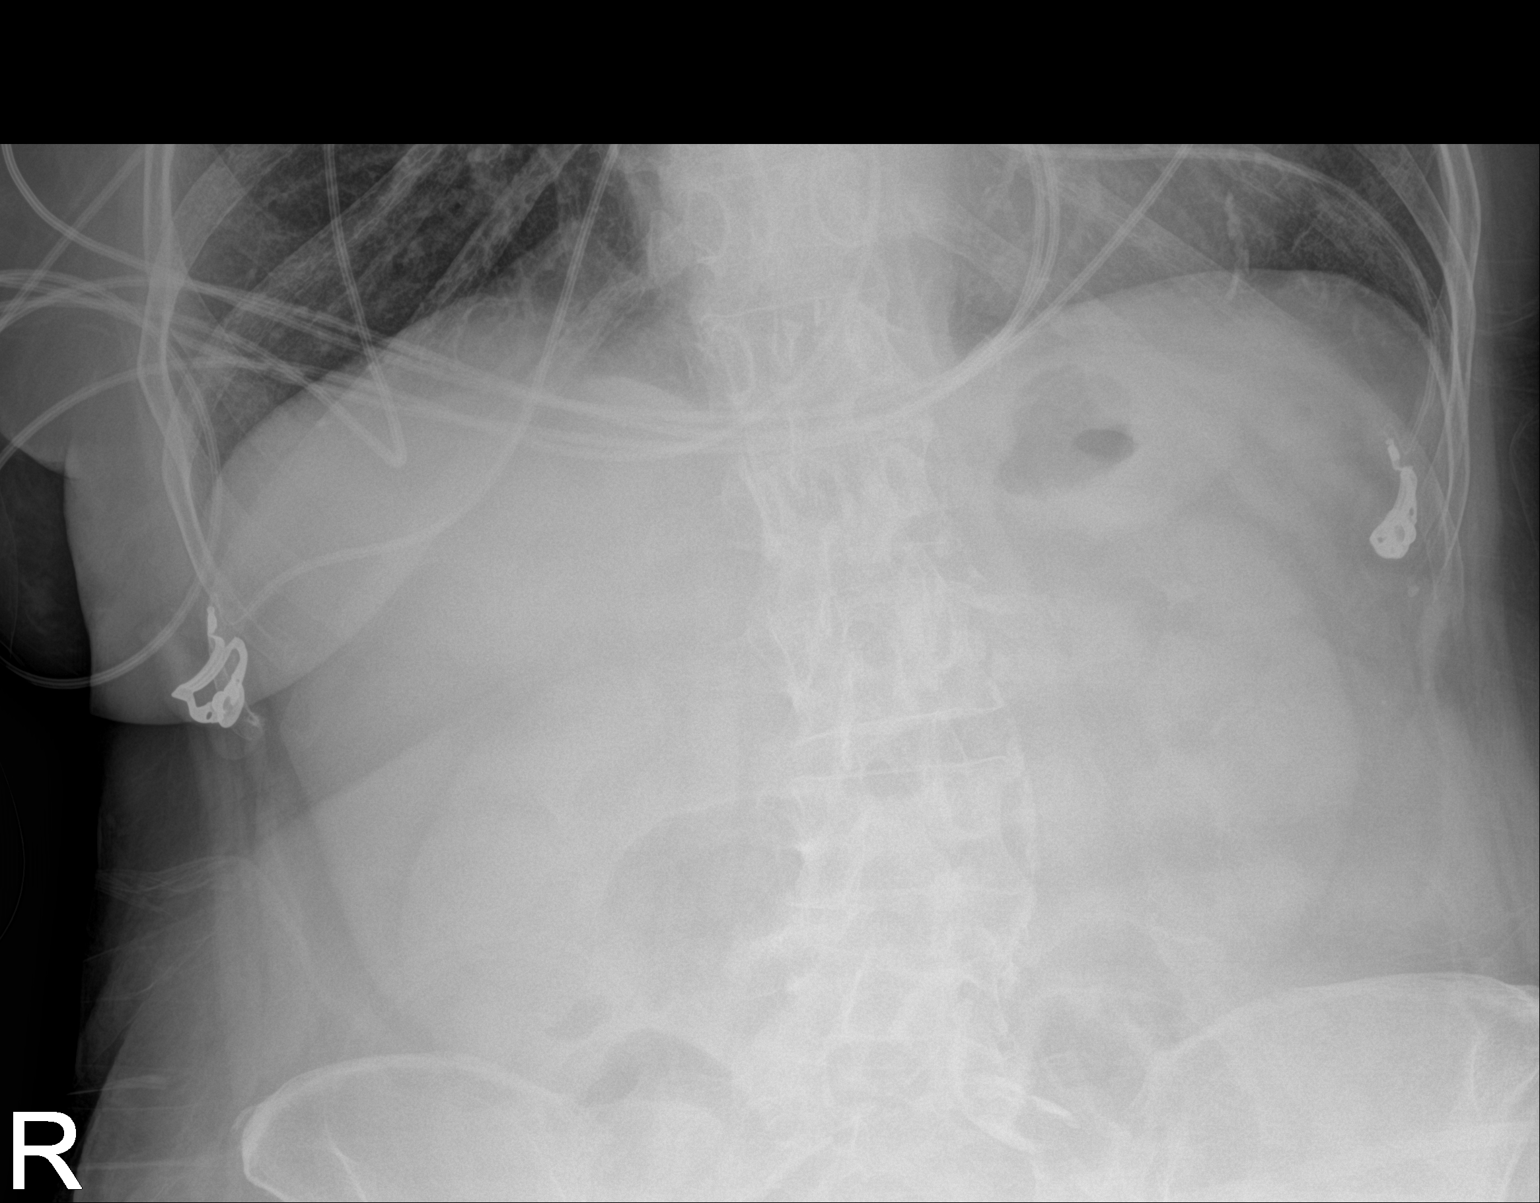

[abdomen supine]
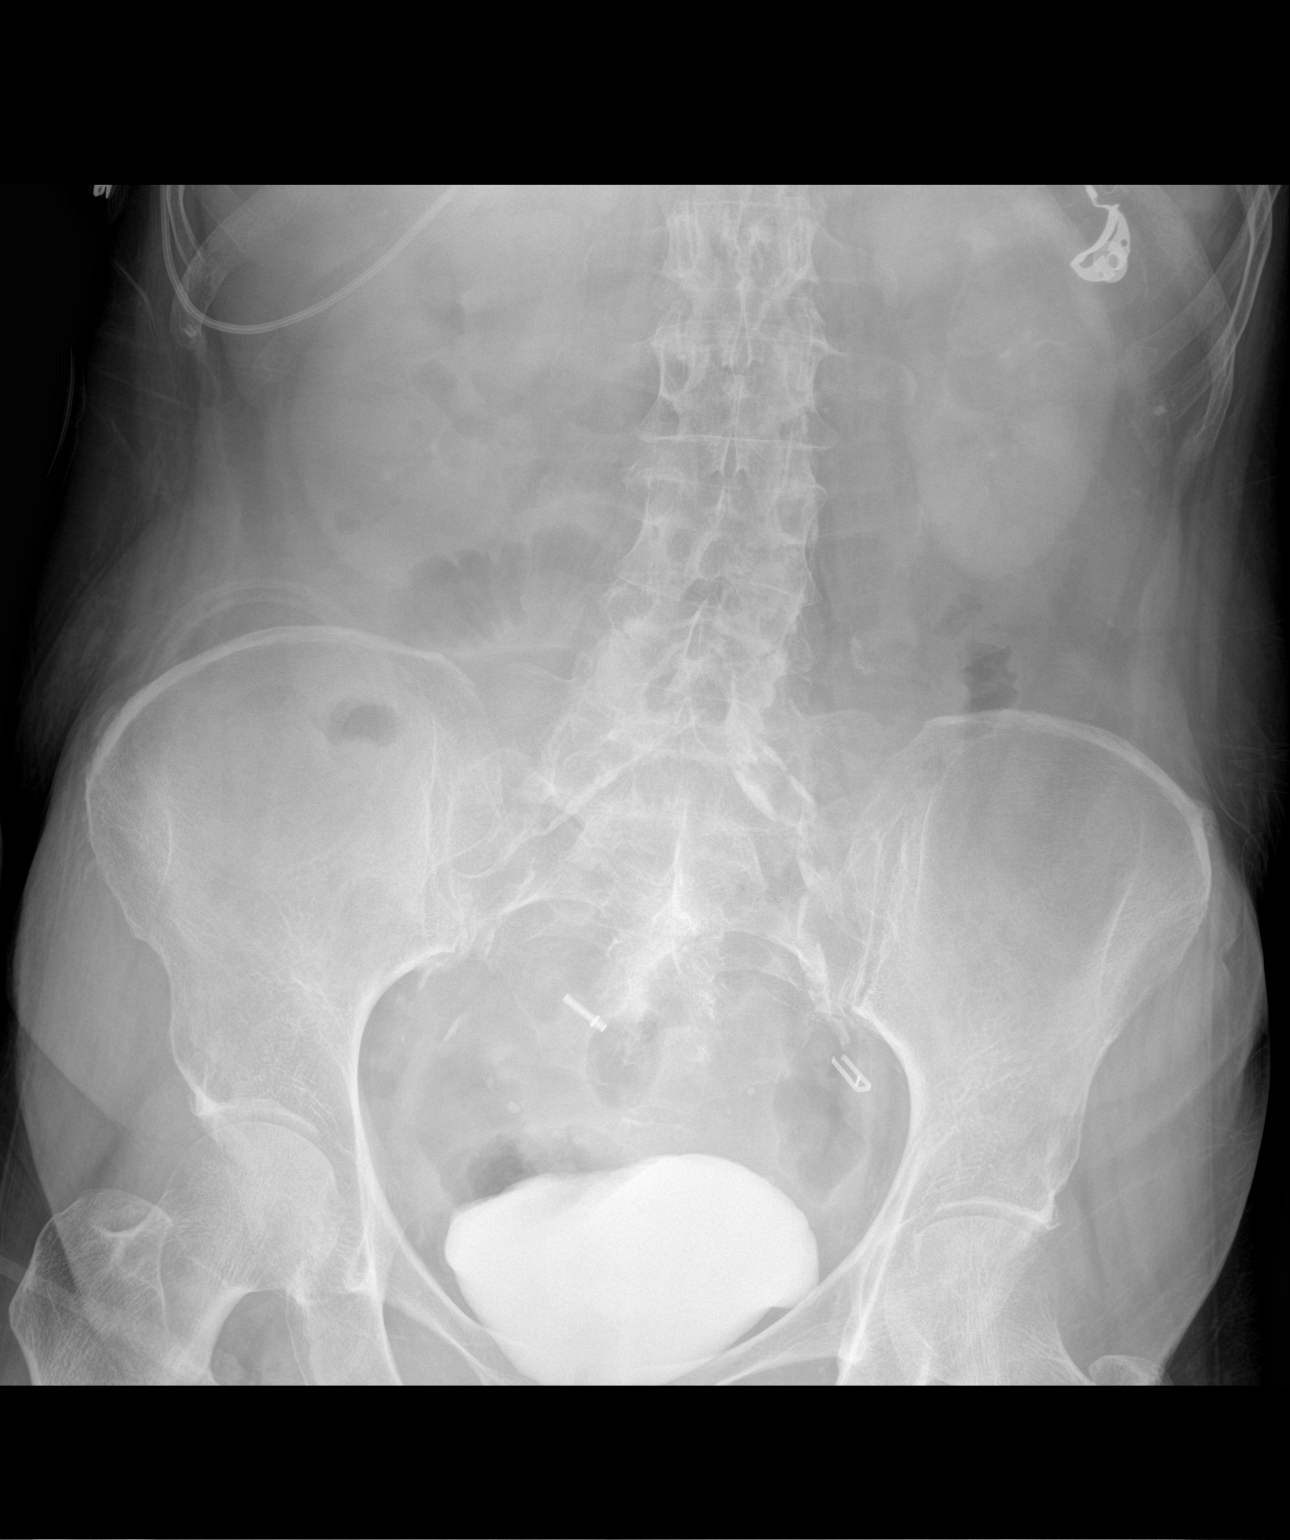

[2 of 2 positions shown; findings below may reference images not displayed]

FINDINGS: Bowel-gas pattern has improved. Gas within nondistended small bowel
loops in the mid abdomen. Contrast material within the urinary
bladder from prior CT. Bilateral tubal ligation clips noted. No free
air organomegaly.
IMPRESSION: Improved bowel gas pattern.  No free air.

## 2019-11-16 ENCOUNTER — Encounter: Payer: Self-pay | Admitting: Family

## 2019-11-16 ENCOUNTER — Ambulatory Visit: Payer: Medicare Other | Attending: Family | Admitting: Family

## 2019-11-16 ENCOUNTER — Other Ambulatory Visit: Payer: Self-pay

## 2019-11-16 VITALS — BP 147/94 | HR 90 | Resp 16 | Ht 66.0 in | Wt 121.2 lb

## 2019-11-16 DIAGNOSIS — Z923 Personal history of irradiation: Secondary | ICD-10-CM | POA: Diagnosis not present

## 2019-11-16 DIAGNOSIS — Z79899 Other long term (current) drug therapy: Secondary | ICD-10-CM | POA: Diagnosis not present

## 2019-11-16 DIAGNOSIS — Z85118 Personal history of other malignant neoplasm of bronchus and lung: Secondary | ICD-10-CM | POA: Diagnosis not present

## 2019-11-16 DIAGNOSIS — J449 Chronic obstructive pulmonary disease, unspecified: Secondary | ICD-10-CM | POA: Insufficient documentation

## 2019-11-16 DIAGNOSIS — C155 Malignant neoplasm of lower third of esophagus: Secondary | ICD-10-CM

## 2019-11-16 DIAGNOSIS — C159 Malignant neoplasm of esophagus, unspecified: Secondary | ICD-10-CM | POA: Insufficient documentation

## 2019-11-16 DIAGNOSIS — F329 Major depressive disorder, single episode, unspecified: Secondary | ICD-10-CM | POA: Insufficient documentation

## 2019-11-16 DIAGNOSIS — F17290 Nicotine dependence, other tobacco product, uncomplicated: Secondary | ICD-10-CM | POA: Insufficient documentation

## 2019-11-16 DIAGNOSIS — Z803 Family history of malignant neoplasm of breast: Secondary | ICD-10-CM | POA: Insufficient documentation

## 2019-11-16 DIAGNOSIS — I5032 Chronic diastolic (congestive) heart failure: Secondary | ICD-10-CM | POA: Diagnosis present

## 2019-11-16 DIAGNOSIS — I110 Hypertensive heart disease with heart failure: Secondary | ICD-10-CM | POA: Insufficient documentation

## 2019-11-16 DIAGNOSIS — Z9981 Dependence on supplemental oxygen: Secondary | ICD-10-CM | POA: Insufficient documentation

## 2019-11-16 DIAGNOSIS — F419 Anxiety disorder, unspecified: Secondary | ICD-10-CM | POA: Diagnosis not present

## 2019-11-16 DIAGNOSIS — Z9221 Personal history of antineoplastic chemotherapy: Secondary | ICD-10-CM | POA: Insufficient documentation

## 2019-11-16 DIAGNOSIS — I10 Essential (primary) hypertension: Secondary | ICD-10-CM

## 2019-11-16 DIAGNOSIS — Z853 Personal history of malignant neoplasm of breast: Secondary | ICD-10-CM | POA: Diagnosis not present

## 2019-11-16 DIAGNOSIS — E119 Type 2 diabetes mellitus without complications: Secondary | ICD-10-CM | POA: Diagnosis not present

## 2019-11-16 NOTE — Patient Instructions (Signed)
Continue weighing daily and call for an overnight weight gain of > 2 pounds or a weekly weight gain of >5 pounds. 

## 2019-11-17 ENCOUNTER — Telehealth: Payer: Self-pay | Admitting: Family

## 2019-11-17 NOTE — Telephone Encounter (Signed)
Spoke with patient on the phone and at her new patient appointment after her recent hospital discharge. Patient was feeling well just a little SOB with exertion. She is keeping up with her weight daily, following a proper diet, and taking medications as she is suppose too.    Alyse Low, [place]

## 2019-11-18 ENCOUNTER — Other Ambulatory Visit: Payer: Self-pay

## 2019-11-19 ENCOUNTER — Inpatient Hospital Stay: Payer: Medicare Other

## 2019-11-19 ENCOUNTER — Telehealth: Payer: Self-pay | Admitting: Internal Medicine

## 2019-11-19 ENCOUNTER — Inpatient Hospital Stay (HOSPITAL_BASED_OUTPATIENT_CLINIC_OR_DEPARTMENT_OTHER): Payer: Medicare Other | Admitting: Internal Medicine

## 2019-11-19 ENCOUNTER — Inpatient Hospital Stay: Payer: Medicare Other | Attending: Internal Medicine

## 2019-11-19 ENCOUNTER — Other Ambulatory Visit: Payer: Self-pay

## 2019-11-19 DIAGNOSIS — C342 Malignant neoplasm of middle lobe, bronchus or lung: Secondary | ICD-10-CM | POA: Diagnosis not present

## 2019-11-19 DIAGNOSIS — I509 Heart failure, unspecified: Secondary | ICD-10-CM | POA: Insufficient documentation

## 2019-11-19 DIAGNOSIS — I129 Hypertensive chronic kidney disease with stage 1 through stage 4 chronic kidney disease, or unspecified chronic kidney disease: Secondary | ICD-10-CM | POA: Insufficient documentation

## 2019-11-19 DIAGNOSIS — Z87891 Personal history of nicotine dependence: Secondary | ICD-10-CM | POA: Diagnosis not present

## 2019-11-19 DIAGNOSIS — Z853 Personal history of malignant neoplasm of breast: Secondary | ICD-10-CM | POA: Insufficient documentation

## 2019-11-19 DIAGNOSIS — D631 Anemia in chronic kidney disease: Secondary | ICD-10-CM | POA: Insufficient documentation

## 2019-11-19 DIAGNOSIS — N184 Chronic kidney disease, stage 4 (severe): Secondary | ICD-10-CM | POA: Insufficient documentation

## 2019-11-19 DIAGNOSIS — Z803 Family history of malignant neoplasm of breast: Secondary | ICD-10-CM | POA: Diagnosis not present

## 2019-11-19 DIAGNOSIS — C155 Malignant neoplasm of lower third of esophagus: Secondary | ICD-10-CM

## 2019-11-19 DIAGNOSIS — J449 Chronic obstructive pulmonary disease, unspecified: Secondary | ICD-10-CM | POA: Insufficient documentation

## 2019-11-19 DIAGNOSIS — Z9221 Personal history of antineoplastic chemotherapy: Secondary | ICD-10-CM | POA: Insufficient documentation

## 2019-11-19 DIAGNOSIS — Z923 Personal history of irradiation: Secondary | ICD-10-CM | POA: Diagnosis not present

## 2019-11-19 DIAGNOSIS — E875 Hyperkalemia: Secondary | ICD-10-CM | POA: Diagnosis not present

## 2019-11-19 DIAGNOSIS — Z79899 Other long term (current) drug therapy: Secondary | ICD-10-CM | POA: Insufficient documentation

## 2019-11-19 DIAGNOSIS — F418 Other specified anxiety disorders: Secondary | ICD-10-CM | POA: Insufficient documentation

## 2019-11-19 LAB — CBC WITH DIFFERENTIAL/PLATELET
Abs Immature Granulocytes: 0.01 10*3/uL (ref 0.00–0.07)
Basophils Absolute: 0.1 10*3/uL (ref 0.0–0.1)
Basophils Relative: 1 %
Eosinophils Absolute: 0.4 10*3/uL (ref 0.0–0.5)
Eosinophils Relative: 6 %
HCT: 35.8 % — ABNORMAL LOW (ref 36.0–46.0)
Hemoglobin: 10.9 g/dL — ABNORMAL LOW (ref 12.0–15.0)
Immature Granulocytes: 0 %
Lymphocytes Relative: 14 %
Lymphs Abs: 0.9 10*3/uL (ref 0.7–4.0)
MCH: 30.3 pg (ref 26.0–34.0)
MCHC: 30.4 g/dL (ref 30.0–36.0)
MCV: 99.4 fL (ref 80.0–100.0)
Monocytes Absolute: 0.6 10*3/uL (ref 0.1–1.0)
Monocytes Relative: 10 %
Neutro Abs: 4.7 10*3/uL (ref 1.7–7.7)
Neutrophils Relative %: 69 %
Platelets: 261 10*3/uL (ref 150–400)
RBC: 3.6 MIL/uL — ABNORMAL LOW (ref 3.87–5.11)
RDW: 17.6 % — ABNORMAL HIGH (ref 11.5–15.5)
WBC: 6.8 10*3/uL (ref 4.0–10.5)
nRBC: 0 % (ref 0.0–0.2)

## 2019-11-19 LAB — BASIC METABOLIC PANEL
Anion gap: 10 (ref 5–15)
BUN: 19 mg/dL (ref 8–23)
CO2: 24 mmol/L (ref 22–32)
Calcium: 9.6 mg/dL (ref 8.9–10.3)
Chloride: 103 mmol/L (ref 98–111)
Creatinine, Ser: 2.36 mg/dL — ABNORMAL HIGH (ref 0.44–1.00)
GFR calc Af Amer: 24 mL/min — ABNORMAL LOW (ref >60)
GFR calc non Af Amer: 21 mL/min — ABNORMAL LOW (ref >60)
Glucose, Bld: 110 mg/dL — ABNORMAL HIGH (ref 70–99)
Potassium: 4.3 mmol/L (ref 3.5–5.1)
Sodium: 137 mmol/L (ref 135–145)

## 2019-11-19 LAB — SAMPLE TO BLOOD BANK

## 2019-11-19 NOTE — Telephone Encounter (Signed)
Spoke with daughter regarding patient's clinical condition-stable; follow-up with PET scan in 2 weeks.  In agreement.

## 2019-11-19 NOTE — Progress Notes (Signed)
[place] OFFICE PROGRESS NOTE  Patient Care Team: [facility] [place] as PCP - General ([place]) [name], FNP as Nurse Practitioner (Family Medicine) [name], NP as Nurse Practitioner (Nurse Practitioner) [name], RN as Registered Nurse [name], RN as Registered Nurse [name], RN as Oncology Nurse Navigator  Cancer Staging Breast cancer in female [facility]) Staging form: Breast, AJCC 7th Edition - Clinical: Stage IIIA (T3, N1, M0) - Signed by [name], NP on 02/21/2016    Oncology History Overview Note  # 2016- JAN RUL 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 A. LUNG MASS, RIGHT; CT-GUIDED BIOPSY: - ADENOCARCINOMA, ACINAR AND PAPILLARY MORPHOLOGIES. S/p RT [finished Dec 2017] s/p RT [DEC 2017]; AUG 2019- PET-right middle lobe radiation changes no evidence of recurrence.  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  T1b- submucosal involvement; N0- stage I [[name]; [facility]]; unable to proceed with Endo mucosal resection-because of extensive disease [Dr.Branch; [facility]]; recommend definitive radiation [finished  RT- 09/21/2019]   #September 2020 worsening macrocytic anemia--bone marrow biopsy shows plasma cells up to 5%; unlikely to explain patient's anemia.  MDS is still a possibility-if anemia worsens would recommend peripheral blood NGS.  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]  # 2019-July-aug 2019-ischemic colitis status post right  hemicolectomy [Dr.[name]]; acute renal failure;  -----------------------------------------------------------------    DIAGNOSIS: RML Lung ca adeno [stage I] #squamous cell carcinoma esophagus -stage I   CURRENT/MOST RECENT THERAPY: Status post radiation       Breast cancer in female [facility])  07/24/1999 Initial Diagnosis   Breast cancer in female ([place]) T3 N1 M0 tumor ER/PR positive   08/24/1999 -  Anti-estrogen oral therapy   Started tamoxifen   08/24/1999 Surgery   Status post modified radical mastectomy of left breast    Chemotherapy      Radiation Therapy     11/24/2003 - 02/20/2009 Anti-estrogen oral therapy   Started Aromasin   Carcinoma of overlapping sites of left breast in female, estrogen receptor positive ([place])  06/22/2016 Initial Diagnosis   Cancer of overlapping sites of left female breast ([place])   Malignant neoplasm of upper lobe of right lung ([place])  Malignant neoplasm of lower third of esophagus ([place])  05/01/2019 Initial Diagnosis   Malignant neoplasm of lower third of esophagus ([place])     INTERVAL HISTORY:  Danielle [name] 64 y.o.  female pleasant patient above history of multiple malignancies including most recent stage I squamous cell cancer of esophagus currently status post radiation; and history of severe anemia is here for follow-up.  Patient was recently admitted to hospital for prerenal failure/acute renal failure from improved with IV fluids.  Patient also needed Retacrit the hospital for severe anemia hemoglobin around 6.  She also underwent blood transfusion hospital.  Currently ostomy output is improved.  Denies any diarrhea.  Swallowing improved.   Review of Systems  Constitutional: Positive for malaise/fatigue and weight loss. Negative for chills, diaphoresis and fever.  HENT: Negative for nosebleeds and sore throat.  Eyes: Negative for double vision.  Respiratory: Positive for cough and shortness of breath. Negative for hemoptysis, sputum  production and wheezing.   Cardiovascular: Negative for chest pain, palpitations, orthopnea and leg swelling.  Gastrointestinal: Negative for abdominal pain, blood in stool, constipation, heartburn, melena and vomiting.       Dysphagia.  Genitourinary: Negative for dysuria, frequency and urgency.  Musculoskeletal: Negative for back pain and joint pain.  Skin: Negative.  Negative for itching and rash.  Neurological: Negative for tingling, focal weakness, weakness and headaches.  Endo/Heme/Allergies: Does not bruise/bleed easily.  Psychiatric/Behavioral: Negative for depression. The patient is not nervous/anxious and does not have insomnia.       PAST MEDICAL HISTORY :  Past Medical History:  Diagnosis Date  . Anxiety   . Arthritis   . Breast cancer ([place]) 2000  . Cancer ([place]) left    breast cancer 2000, chemo tx's with total mastectomy and lymph nodes resected.   . Cancer of right lung ([place]) 02/21/2016   rad tx's.   . CHF (congestive heart failure) ([place])   . COPD (chronic obstructive pulmonary disease) ([place])   . Dependence on supplemental oxygen   . Depression   . Heart murmur   . Hypertension   . Lung nodule   . Lymphedema   . Personal history of chemotherapy   . Personal history of radiation therapy   . Shortness of breath dyspnea    with exertion  . Status post chemotherapy 2001   left breast cancer  . Status post radiation therapy 2001   left breast cancer    PAST SURGICAL HISTORY :   Past Surgical History:  Procedure Laterality Date  . Breast Biospy Left    [facility]  . BREAST SURGERY    . COLONOSCOPY N/A 04/30/2018   Procedure: COLONOSCOPY;  Surgeon: Virgel Manifold, MD;  Location: [facility] ENDOSCOPY;  Service: Endoscopy;  Laterality: N/A;  . COLONOSCOPY N/A 07/22/2018   Procedure: COLONOSCOPY;  Surgeon: Virgel Manifold, MD;  Location: [facility] ENDOSCOPY;  Service: Endoscopy;  Laterality: N/A;  . DILATION AND CURETTAGE OF UTERUS    . ELECTROMAGNETIC NAVIGATION  BROCHOSCOPY Right 04/11/2016   Procedure: ELECTROMAGNETIC NAVIGATION BRONCHOSCOPY;  Surgeon: Vilinda Boehringer, MD;  Location: [facility] ORS;  Service: Cardiopulmonary;  Laterality: Right;  . ESOPHAGOGASTRODUODENOSCOPY N/A 07/22/2018   Procedure: ESOPHAGOGASTRODUODENOSCOPY (EGD);  Surgeon: Virgel Manifold, MD;  Location: [facility] ENDOSCOPY;  Service: Endoscopy;  Laterality: N/A;  . ESOPHAGOGASTRODUODENOSCOPY (EGD) WITH PROPOFOL N/A 05/07/2018   Procedure: ESOPHAGOGASTRODUODENOSCOPY (EGD) WITH PROPOFOL;  Surgeon: Lucilla Lame, MD;  Location: [facility] ENDOSCOPY;  Service: Endoscopy;  Laterality: N/A;  . ESOPHAGOGASTRODUODENOSCOPY (EGD) WITH PROPOFOL N/A 04/24/2019   Procedure: ESOPHAGOGASTRODUODENOSCOPY (EGD) WITH PROPOFOL;  Surgeon: Jonathon Bellows, MD;  Location: [facility] ENDOSCOPY;  Service: Gastroenterology;  Laterality: N/A;  . EUS N/A 05/07/2019   Procedure: FULL UPPER ENDOSCOPIC ULTRASOUND (EUS) RADIAL;  Surgeon: Jola Schmidt, MD;  Location: [facility] ENDOSCOPY;  Service: Endoscopy;  Laterality: N/A;  . history of colonoscopy]    . ILEOSCOPY N/A 07/22/2018   Procedure: ILEOSCOPY THROUGH STOMA;  Surgeon: Virgel Manifold, MD;  Location: [facility] ENDOSCOPY;  Service: Endoscopy;  Laterality: N/A;  . ILEOSTOMY    . ILEOSTOMY N/A 09/08/2018   Procedure: ILEOSTOMY REVISION POSSIBLE CREATION;  Surgeon: Herbert Pun, MD;  Location: [facility] ORS;  Service: General;  Laterality: N/A;  . ILEOSTOMY CLOSURE N/A 08/15/2018   Procedure: DILATION OF ILEOSTOMY STRICTURE;  Surgeon: Herbert Pun, MD;  Location: [facility] ORS;  Service: General;  Laterality: N/A;  .  LAPAROTOMY Right 05/04/2018   Procedure: EXPLORATORY LAPAROTOMY right colectomy right and left ostomy;  Surgeon: Herbert Pun, MD;  Location: [facility] ORS;  Service: General;  Laterality: Right;  . LUNG BIOPSY    . MASTECTOMY Left    2000, [facility]  . ROTATOR CUFF REPAIR Right    [facility]    FAMILY HISTORY :   Family History  Problem Relation Age of Onset  . Breast  cancer Mother 45  . Cancer Mother        Breast   . Cirrhosis Father   . Breast cancer Paternal Aunt 22  . Cancer Maternal Aunt        Breast     SOCIAL HISTORY:   Social History   Tobacco Use  . Smoking status: Former Smoker    Packs/day: 0.50    Years: 20.00    Pack years: 10.00    Types: Cigarettes    Quit date: 07/02/2012    Years since quitting: 7.3  . Smokeless tobacco: Current User    Types: Snuff  . Tobacco comment: quit 2014  Substance Use Topics  . Alcohol use: Not Currently    Comment: Occasionally  . Drug use: No    ALLERGIES:  has No Known Allergies.  MEDICATIONS:  Current Outpatient Medications  Medication Sig Dispense Refill  . acetaminophen (TYLENOL) 325 MG tablet Take 2 tablets (650 mg total) by mouth every 6 (six) hours as needed for mild pain (or Fever >/= 101).    [name] albuterol (PROVENTIL HFA;VENTOLIN HFA) 108 (90 Base) MCG/ACT inhaler Inhale 2 puffs into the lungs every 6 (six) hours as needed for wheezing or shortness of breath. 1 Inhaler 2  . calcium carbonate (TUMS - DOSED IN MG ELEMENTAL CALCIUM) 500 MG chewable tablet Chew 0.5 tablets (100 mg of elemental calcium total) by mouth daily.    . calcium-vitamin D (OSCAL WITH D) 250-125 MG-UNIT tablet Take 1 tablet by mouth daily.    . carvedilol (COREG) 6.25 MG tablet Take 1 tablet (6.25 mg total) by mouth 2 (two) times daily with a meal. 60 tablet 0  . citalopram (CELEXA) 20 MG tablet Take 40 mg by mouth daily.     . ferrous sulfate 325 (65 FE) MG tablet Take 1 tablet (325 mg total) by mouth 2 (two) times daily with a meal. 60 tablet 3  . Fluticasone-Umeclidin-Vilant 100-62.5-25 MCG/INH AEPB Inhale 1 puff into the lungs daily.     [name] guaiFENesin-dextromethorphan (ROBITUSSIN DM) 100-10 MG/5ML syrup Take 5 mLs by mouth every 4 (four) hours as needed for cough.    . loperamide (IMODIUM A-D) 2 MG tablet Take 1 tablet (2 mg total) by mouth as needed for diarrhea or loose stools. 30 tablet 0  . Multiple Vitamin  (MULTIVITAMIN WITH MINERALS) TABS tablet Take 1 tablet by mouth daily. 30 tablet 1  . Nutritional Supplements (FEEDING SUPPLEMENT, NEPRO CARB STEADY,) LIQD Take 237 mLs by mouth 3 (three) times daily between meals. 21330 mL 0  . pantoprazole (PROTONIX) 40 MG tablet Take 1 tablet (40 mg total) by mouth 2 (two) times daily before a meal. 60 tablet 0  . sodium bicarbonate 650 MG tablet Take 2 tablets (1,300 mg total) by mouth 3 (three) times daily. 180 tablet 0  . sucralfate (CARAFATE) 1 GM/10ML suspension Take 10 mLs (1 g total) by mouth 4 (four) times daily. 1200 mL 2   No current facility-administered medications for this visit.    PHYSICAL EXAMINATION: ECOG PERFORMANCE STATUS: 1 - Symptomatic  but completely ambulatory  BP 125/73   Pulse 83   Temp (!) 97.3 F (36.3 C) (Tympanic)   Resp 18   Wt 121 lb 11.2 oz (55.2 kg)   BMI 19.64 kg/m   Filed Weights   11/19/19 1132  Weight: 121 lb 11.2 oz (55.2 kg)    Physical Exam  Constitutional: She is oriented to person, place, and time.  Thin built moderately nourished female patient.  She is walking herself.  She is alone.  HENT:  Head: Normocephalic and atraumatic.  Mouth/Throat: Oropharynx is clear and moist. No oropharyngeal exudate.  Eyes: Pupils are equal, round, and reactive to light.  Cardiovascular: Normal rate and regular rhythm.  Pulmonary/Chest: No respiratory distress. She has no wheezes.  Decreased breath sounds bilaterally.  No wheeze or crackles.  Abdominal: Soft. Bowel sounds are normal. She exhibits no distension and no mass. There is no abdominal tenderness. There is no rebound and no guarding.  Musculoskeletal:        General: No tenderness or edema. Normal range of motion.     Cervical back: Normal range of motion and neck supple.  Neurological: She is alert and oriented to person, place, and time.  Skin: Skin is warm.  Psychiatric: Affect normal.       LABORATORY DATA:  I have reviewed the data as listed     Component Value Date/Time   NA 137 11/19/2019 1101   NA 132 (L) 02/09/2015 1100   K 4.3 11/19/2019 1101   K 3.8 02/09/2015 1100   CL 103 11/19/2019 1101   CL 95 (L) 02/09/2015 1100   CO2 24 11/19/2019 1101   CO2 29 02/09/2015 1100   GLUCOSE 110 (H) 11/19/2019 1101   GLUCOSE 105 (H) 02/09/2015 1100   BUN 19 11/19/2019 1101   BUN 16 02/09/2015 1100   CREATININE 2.36 (H) 11/19/2019 1101   CREATININE 0.81 02/09/2015 1100   CALCIUM 9.6 11/19/2019 1101   CALCIUM 9.1 02/09/2015 1100   PROT 9.5 (H) 10/29/2019 0906   PROT 7.7 02/09/2015 1100   ALBUMIN 4.5 10/29/2019 0906   ALBUMIN 4.3 02/09/2015 1100   AST 22 10/29/2019 0906   AST 29 02/09/2015 1100   ALT 17 10/29/2019 0906   ALT 20 02/09/2015 1100   ALKPHOS 66 10/29/2019 0906   ALKPHOS 69 02/09/2015 1100   BILITOT 0.6 10/29/2019 0906   BILITOT 0.9 02/09/2015 1100   GFRNONAA 21 (L) 11/19/2019 1101   GFRNONAA >60 02/09/2015 1100   GFRAA 24 (L) 11/19/2019 1101   GFRAA >60 02/09/2015 1100    No results found for: SPEP, UPEP  Lab Results  Component Value Date   WBC 6.8 11/19/2019   NEUTROABS 4.7 11/19/2019   HGB 10.9 (L) 11/19/2019   HCT 35.8 (L) 11/19/2019   MCV 99.4 11/19/2019   PLT 261 11/19/2019      Chemistry      Component Value Date/Time   NA 137 11/19/2019 1101   NA 132 (L) 02/09/2015 1100   K 4.3 11/19/2019 1101   K 3.8 02/09/2015 1100   CL 103 11/19/2019 1101   CL 95 (L) 02/09/2015 1100   CO2 24 11/19/2019 1101   CO2 29 02/09/2015 1100   BUN 19 11/19/2019 1101   BUN 16 02/09/2015 1100   CREATININE 2.36 (H) 11/19/2019 1101   CREATININE 0.81 02/09/2015 1100      Component Value Date/Time   CALCIUM 9.6 11/19/2019 1101   CALCIUM 9.1 02/09/2015 1100   ALKPHOS 66 10/29/2019  0906   ALKPHOS 69 02/09/2015 1100   AST 22 10/29/2019 0906   AST 29 02/09/2015 1100   ALT 17 10/29/2019 0906   ALT 20 02/09/2015 1100   BILITOT 0.6 10/29/2019 0906   BILITOT 0.9 02/09/2015 1100       RADIOGRAPHIC STUDIES: I  have personally reviewed the radiological images as listed and agreed with the findings in the report. No results found.   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  Currently s/p definitive radiation [dec 7th 2020].  Clinically stable.  We will get posttreatment PET scan.  #Severe anemia-likely secondary to CKD stage IV-currently on Retacrit; hemoglobin today is 10.3 hold Retacrit today.  #CKD stage IV-clinically improved from recent acute renal failure-thought to be from prerenal failure.  Monitor closely.   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  Await repeat PET scan.  # DISPOSITION: # HOLD retacrit today # follow up in 2 weeks- cbc/bmp; possible Retacrit; PET prior-Dr.B   Orders Placed This Encounter  Procedures  . NM PET Image Restag (PS) Skull Base To Thigh    Standing Status:   Future    Standing Expiration Date:   11/18/2020    Order Specific Question:   ** REASON FOR EXAM (FREE TEXT)    Answer:   esophagus cancer s/p RT    Order Specific Question:   If indicated for the ordered procedure, I authorize the administration of a radiopharmaceutical per Radiology protocol    Answer:   Yes    Order Specific Question:   Radiology Contrast Protocol - do NOT remove file path    Answer:   [email or web address]  . CBC with Differential    Standing Status:   Future    Standing Expiration Date:   11/18/2020  . Basic metabolic panel    Standing Status:   Future    Standing Expiration Date:   11/18/2020  . Hold Tube- Blood Bank    Standing Status:   Future    Standing Expiration Date:   11/18/2020   All questions were answered. The patient knows to call the clinic with any problems, questions or concerns.      [name], MD 11/19/2019 1:23 PM

## 2019-11-19 NOTE — Assessment & Plan Note (Addendum)
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  Currently s/p definitive radiation [dec 7th 2020].  Clinically stable.  We will get posttreatment PET scan.  #Severe anemia-likely secondary to CKD stage IV-currently on Retacrit; hemoglobin today is 10.3 hold Retacrit today.  #CKD stage IV-clinically improved from recent acute renal failure-thought to be from prerenal failure.  Monitor closely.   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  Await repeat PET scan.  # DISPOSITION: # HOLD retacrit today # follow up in 2 weeks- cbc/bmp; possible Retacrit; PET prior-Dr.B

## 2019-11-20 IMAGING — DX DG CHEST 1V
1 series · 1 of 1 positions shown · non-contrast
Comparison: 06/29/2018 chest radiograph

CLINICAL DATA: 62 y/o  F; NG tube placement.  Two days of cough.

EXAM:
CHEST  1 VIEW

[chest ap]
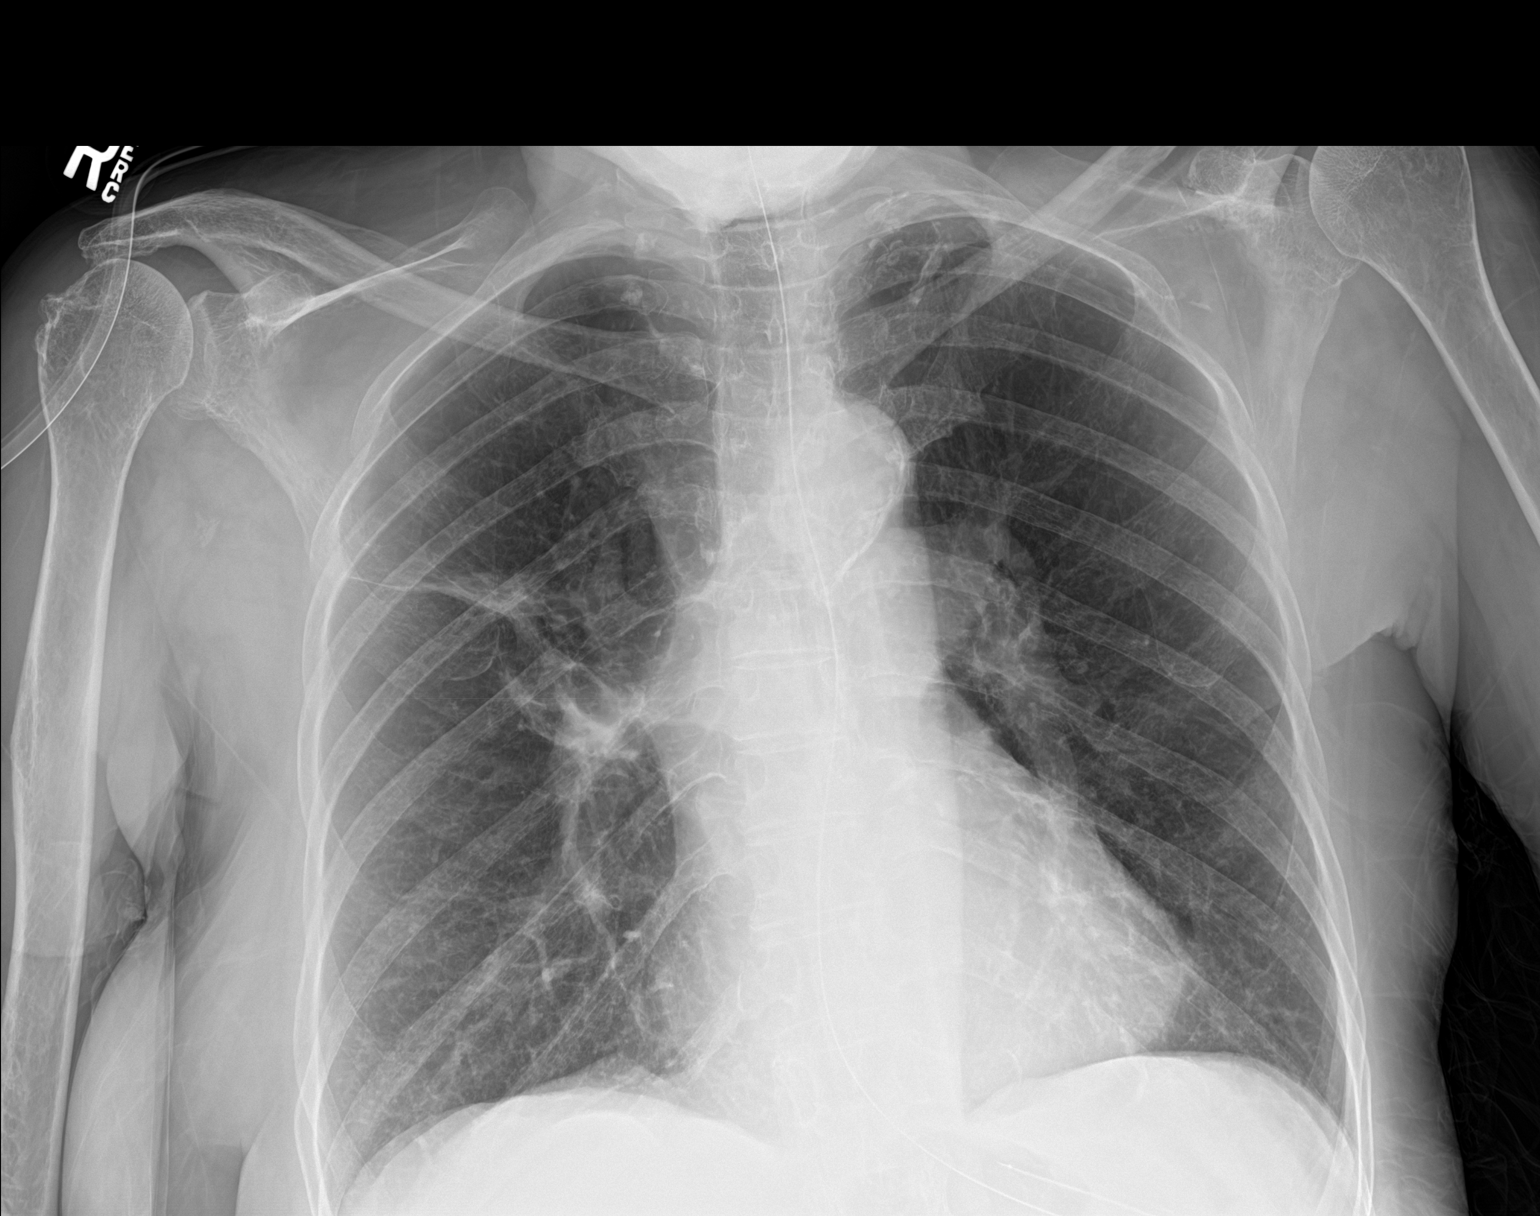

[1 of 1 positions shown; findings below may reference images not displayed]

FINDINGS: Stable normal cardiac silhouette given projection and technique.
Aortic atherosclerosis with calcification. Right upper lung zone
stable scarring. No focal consolidation. No pleural effusion or
pneumothorax. No acute osseous abnormality is evident. Enteric tube
is coiling in the proximal stomach with tip projecting over the
cardia.
IMPRESSION: 1. Enteric tube is coiling in the proximal stomach with tip
projecting over the gastric cardia.
2. No acute pulmonary process identified.
3.  Aortic Atherosclerosis ([id number]).

By: Raymond Maxey M.D.

## 2019-11-22 IMAGING — DX DG ABDOMEN 1V
2 series · 2 of 2 positions shown · non-contrast
Comparison: 07/06/2018 and CT abdomen pelvis 07/05/2018.

CLINICAL DATA: Bowel obstruction.

EXAM:
ABDOMEN - 1 VIEW

[abdomen supine (1 of 2)]
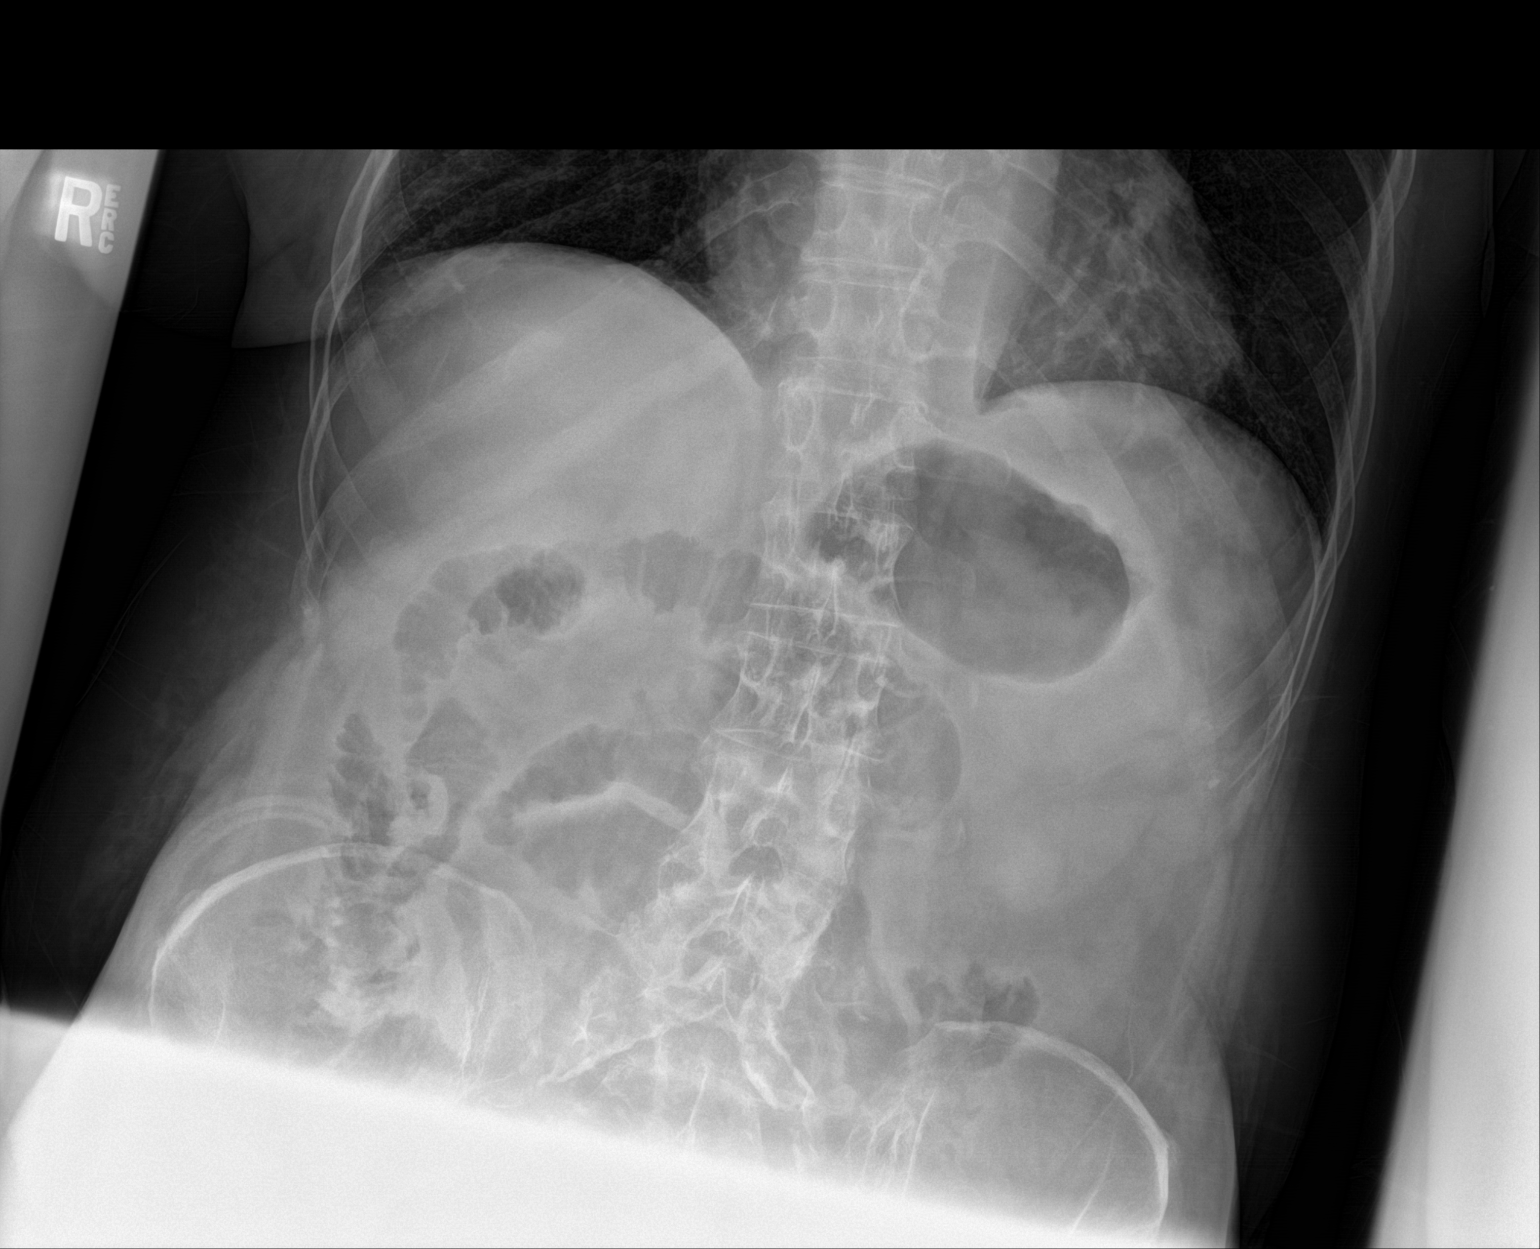

[abdomen supine (2 of 2)]
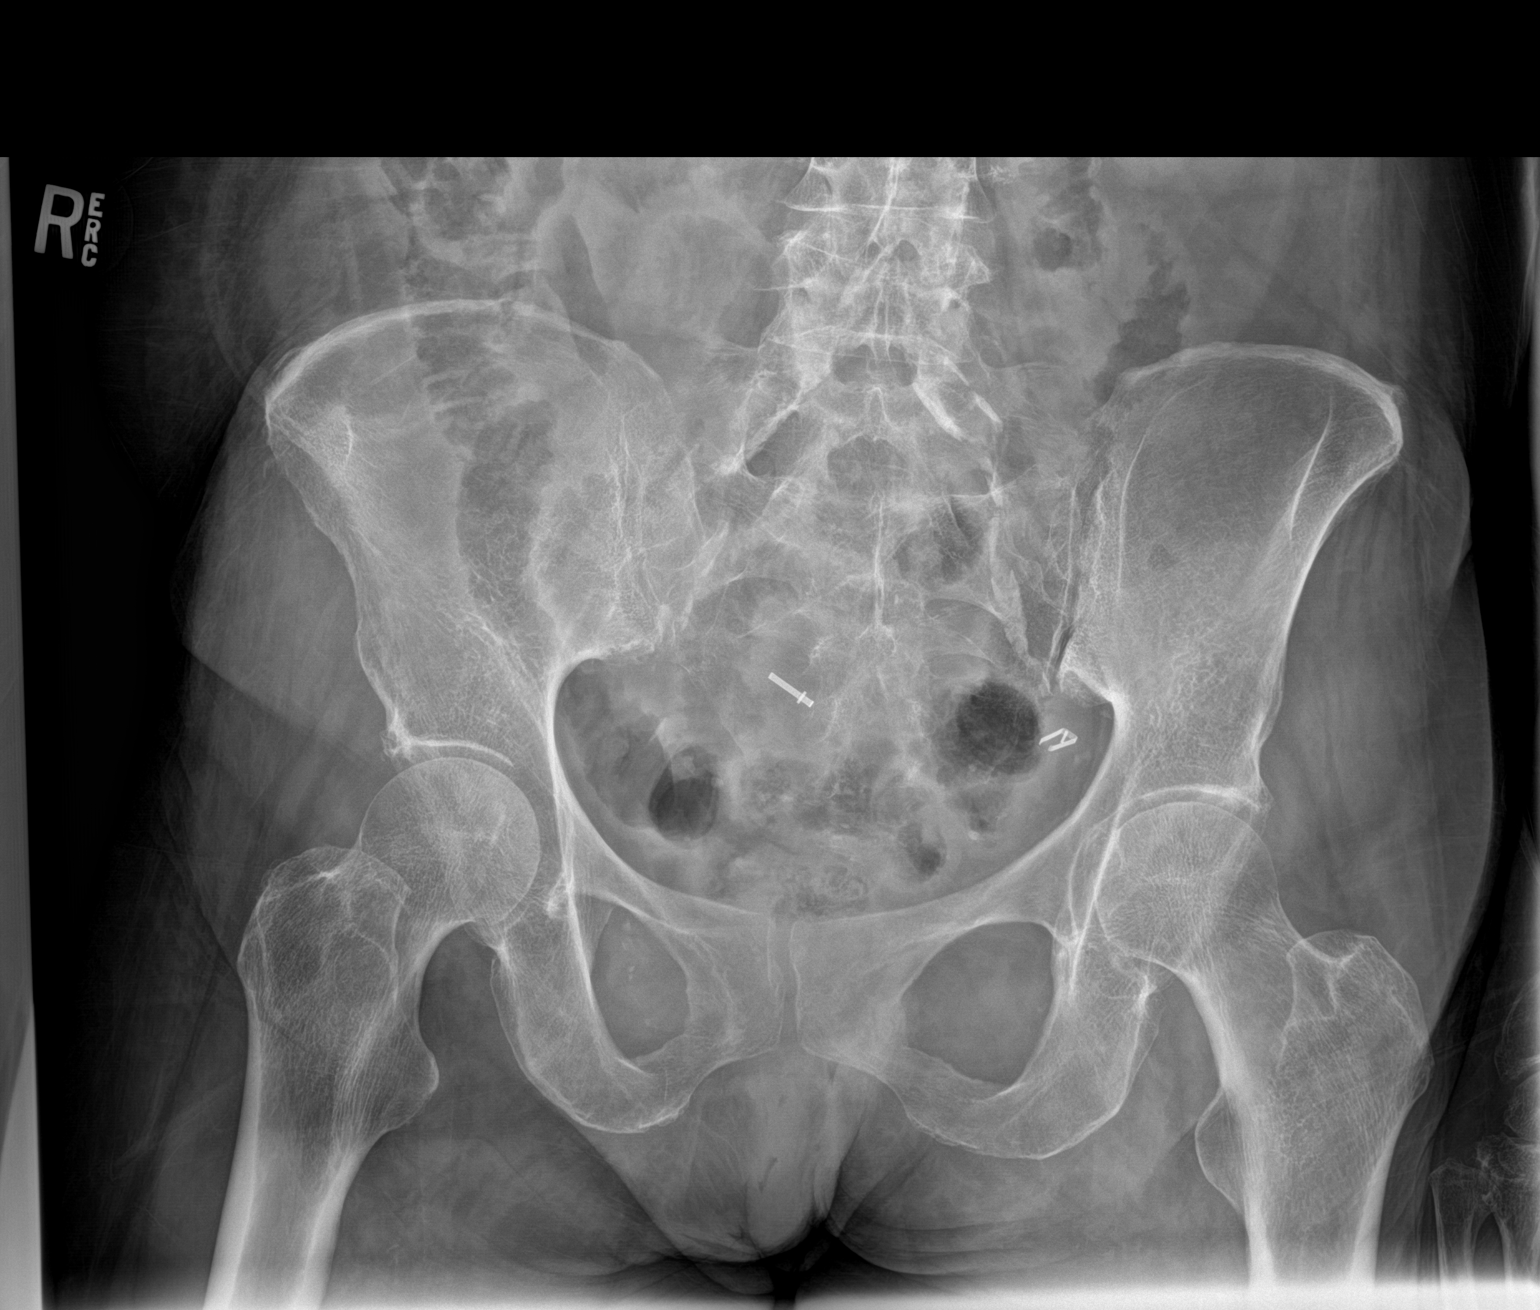

[2 of 2 positions shown; findings below may reference images not displayed]

FINDINGS: Mild residual gaseous distention of small bowel. Gas and stool are
seen in the rectosigmoid colon. Right lower quadrant ileostomy. No
unexpected radiopaque calculi. Lung bases are clear.
IMPRESSION: Mild residual small bowel dilatation.

## 2019-12-01 ENCOUNTER — Inpatient Hospital Stay: Payer: Medicare Other

## 2019-12-01 ENCOUNTER — Other Ambulatory Visit: Payer: Self-pay

## 2019-12-01 ENCOUNTER — Encounter
Admission: RE | Admit: 2019-12-01 | Discharge: 2019-12-01 | Disposition: A | Discharge status: Home / Self Care | Payer: Medicare Other | Source: Ambulatory Visit | Attending: Internal Medicine | Admitting: Internal Medicine

## 2019-12-01 DIAGNOSIS — C155 Malignant neoplasm of lower third of esophagus: Secondary | ICD-10-CM | POA: Diagnosis present

## 2019-12-01 DIAGNOSIS — J90 Pleural effusion, not elsewhere classified: Secondary | ICD-10-CM | POA: Diagnosis not present

## 2019-12-01 DIAGNOSIS — I2510 Atherosclerotic heart disease of native coronary artery without angina pectoris: Secondary | ICD-10-CM | POA: Diagnosis not present

## 2019-12-01 DIAGNOSIS — I700 Atherosclerosis of aorta: Secondary | ICD-10-CM | POA: Insufficient documentation

## 2019-12-01 LAB — GLUCOSE, CAPILLARY: Glucose-Capillary: 84 mg/dL (ref 70–99)

## 2019-12-01 MED ORDER — FLUDEOXYGLUCOSE F - 18 (FDG) INJECTION
6.3000 | Freq: Once | INTRAVENOUS | Status: AC | PRN
  Administered 2019-12-01: 6.36 via INTRAVENOUS

## 2019-12-02 IMAGING — DX DG ABD PORTABLE 1V
1 series · 1 of 1 positions shown · non-contrast
Comparison: CT 07/17/2018

CLINICAL DATA: NG tube placement

EXAM:
PORTABLE ABDOMEN - 1 VIEW

[abdomen kub]
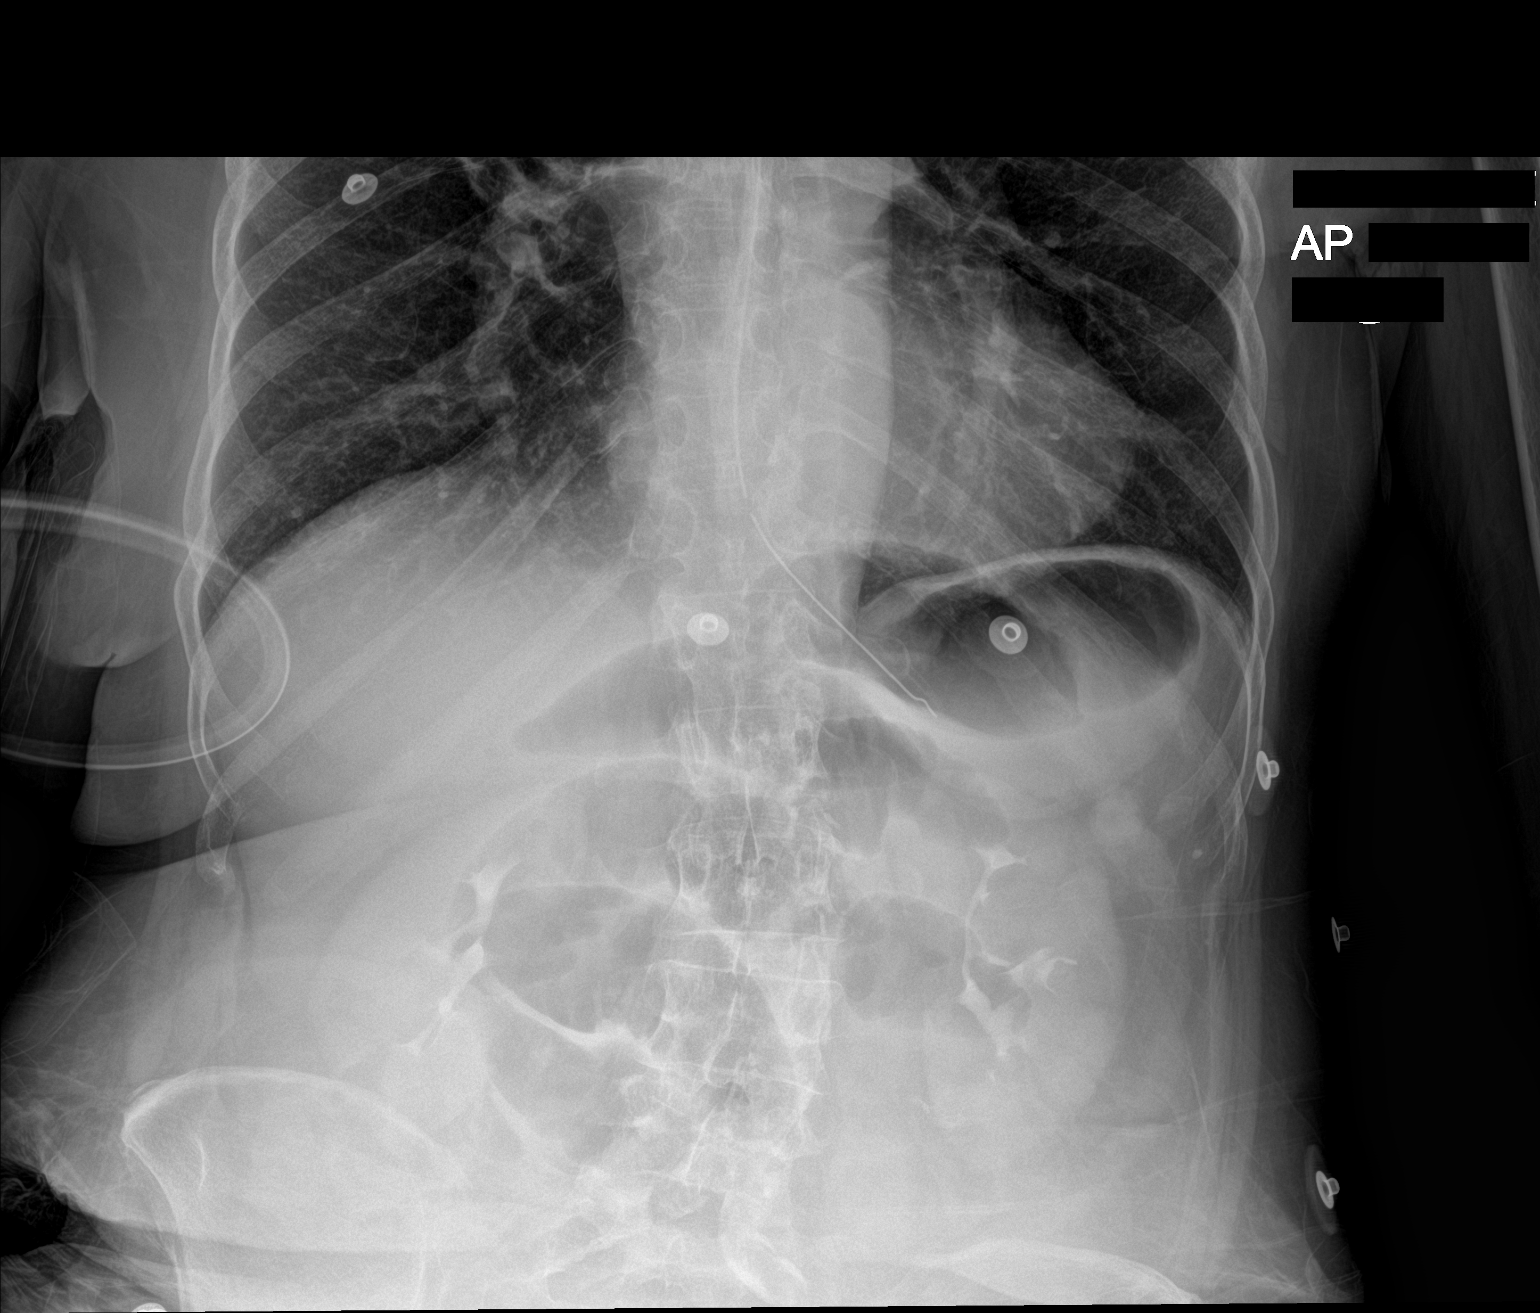

[1 of 1 positions shown; findings below may reference images not displayed]

FINDINGS: Esophageal tube tip overlies proximal stomach, side-port overlies
the distal esophagus. Dilated loops of central small bowel
consistent with obstruction. Residual contrast within the renal
collecting systems.
IMPRESSION: Esophageal tube tip overlies proximal stomach, side-port in the
region of the distal esophagus, suggest further advancement for more
optimal positioning.

## 2019-12-02 IMAGING — CT CT ABD-PELV W/ CM
2 of 5 series · 15 of 46 positions shown, 17 images · IV contrast (omnipaque)
Comparison: Abdominal radiograph earlier this day. Multiple prior
CTs most recently abdominal CT 07/05/2018. PET CT 06/20/2018

CLINICAL DATA: Acute abdominal pain. Vomiting. Recent hospital
discharge for small bowel obstruction, unable to tolerate p.o.
intake without vomiting.

EXAM:
CT ABDOMEN AND PELVIS WITH CONTRAST
TECHNIQUE: Multidetector CT imaging of the abdomen and pelvis was performed
using the standard protocol following bolus administration of
intravenous contrast.
CONTRAST:  75mL OMNIPAQUE IOHEXOL 300 MG/ML  SOLN

[Series 2: axial st · axial · 0.69mm/px · z∈[-798,-458]mm · 12 of 78 slices shown, 14 images]
[im 5/78  soft-tissue]
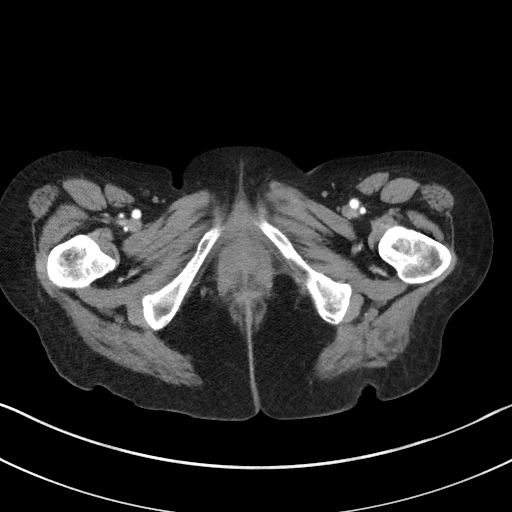
[im 5/78  bone]
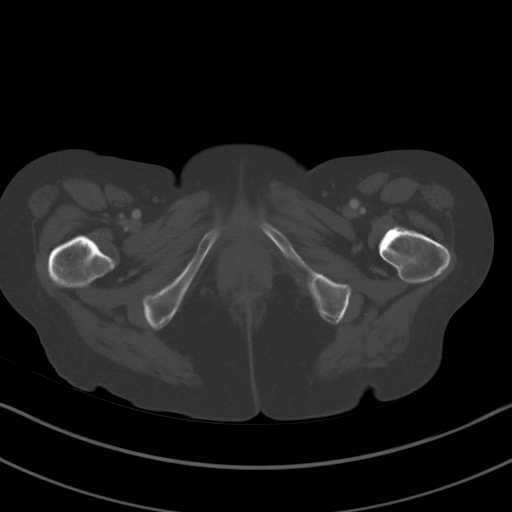
[im 14/78  soft-tissue]
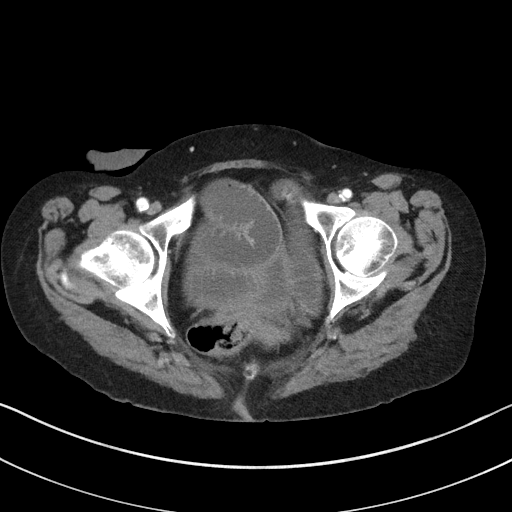
[im 19/78  soft-tissue]
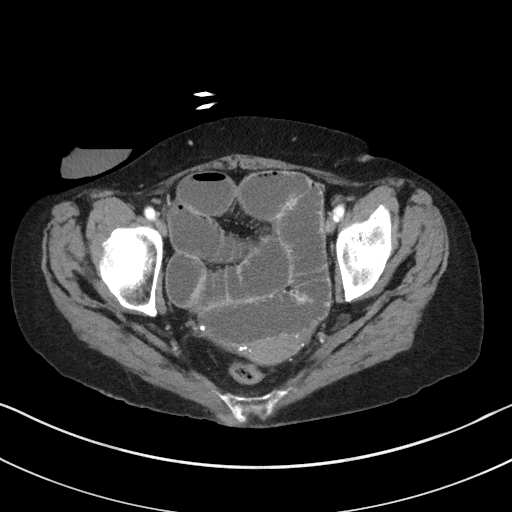
[im 23/78  soft-tissue]
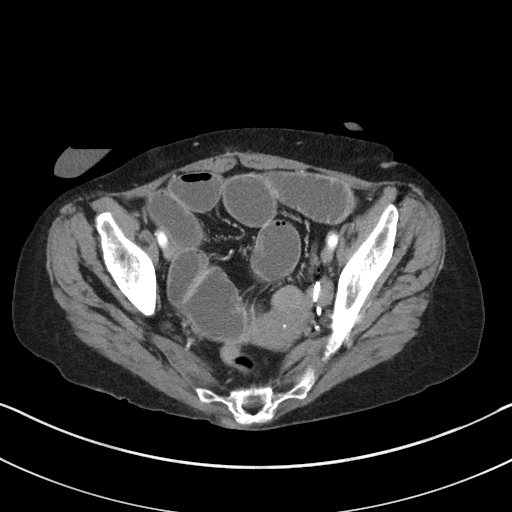
[im 32/78  soft-tissue]
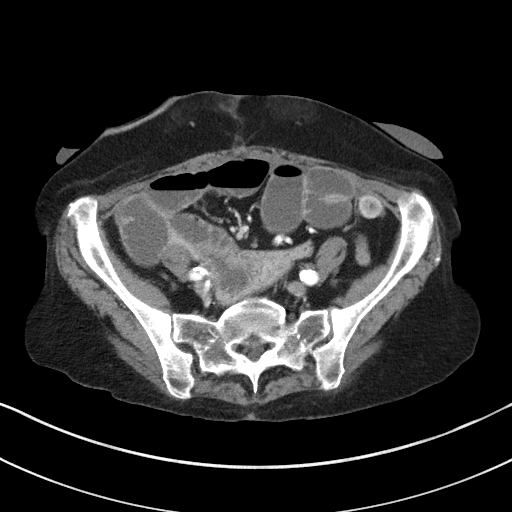
[im 37/78  soft-tissue]
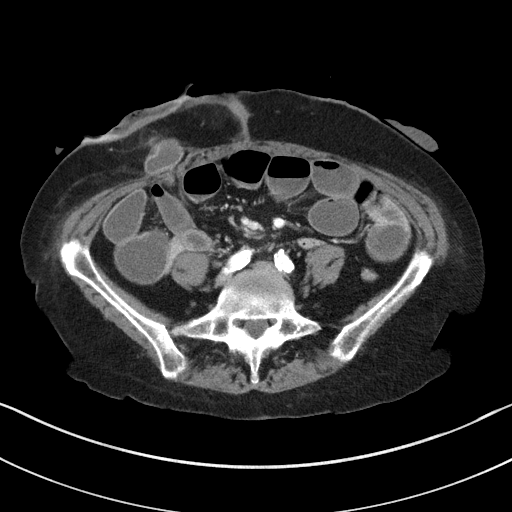
[im 41/78  soft-tissue]
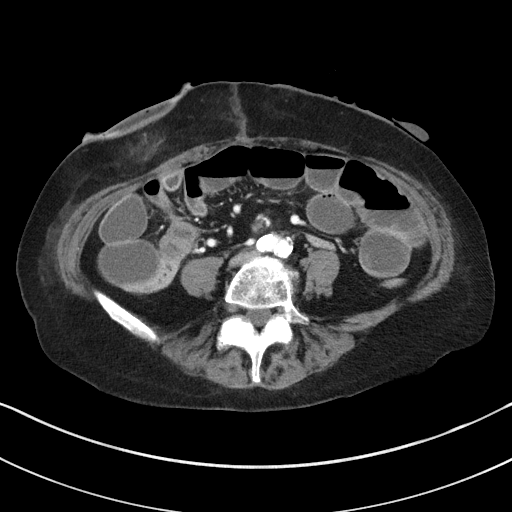
[im 50/78  soft-tissue]
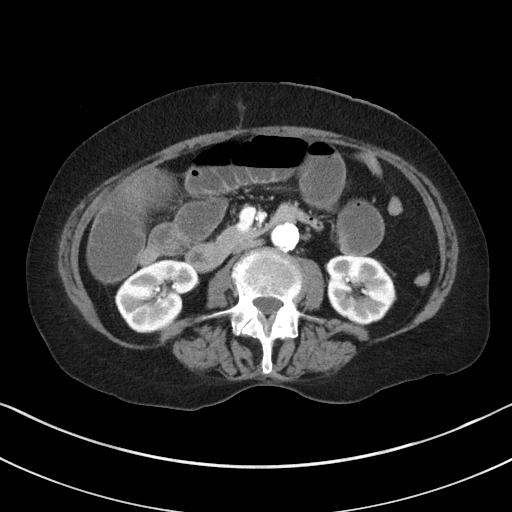
[im 55/78  soft-tissue]
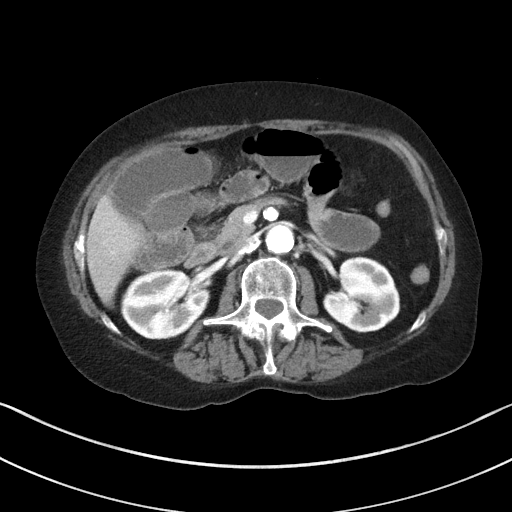
[im 55/78  bone]
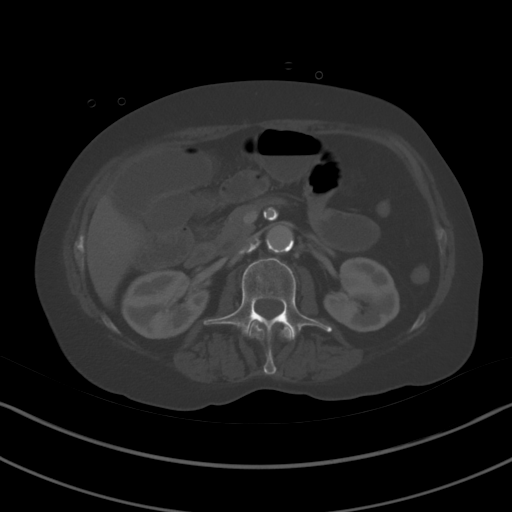
[im 59/78  soft-tissue]
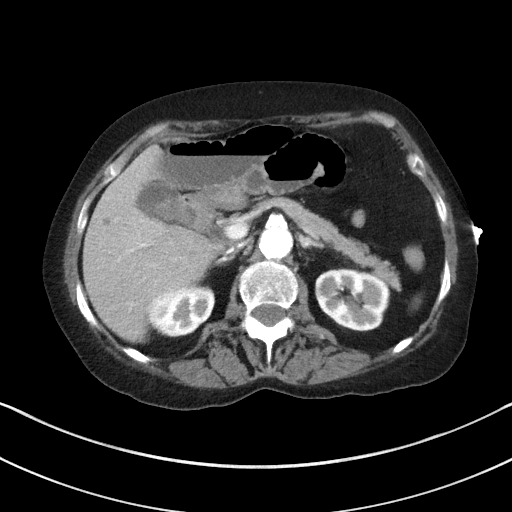
[im 68/78  soft-tissue]
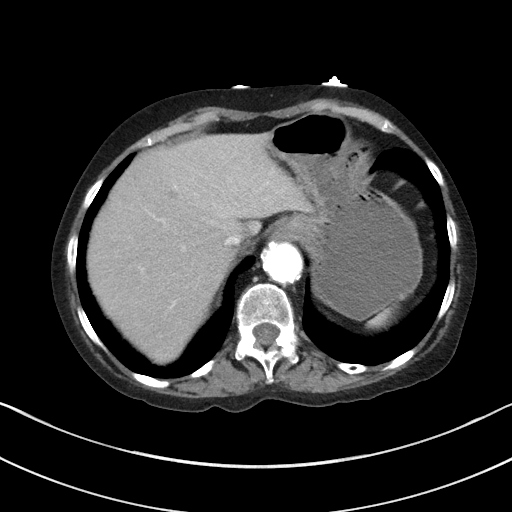
[im 73/78  soft-tissue]
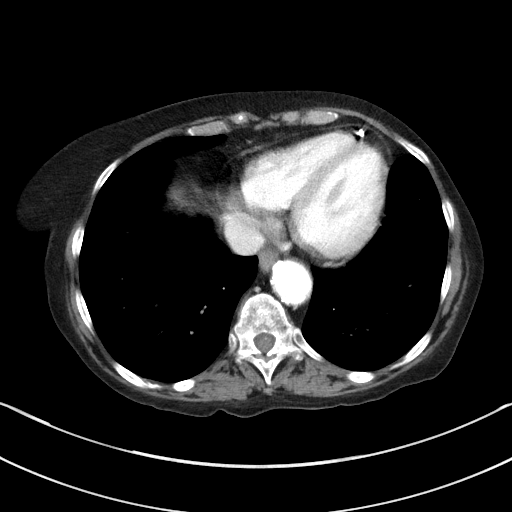

[Series 6: coronal st · coronal · 0.62mm/px · 3 of 77 slices shown]
[im 26/77  soft-tissue]
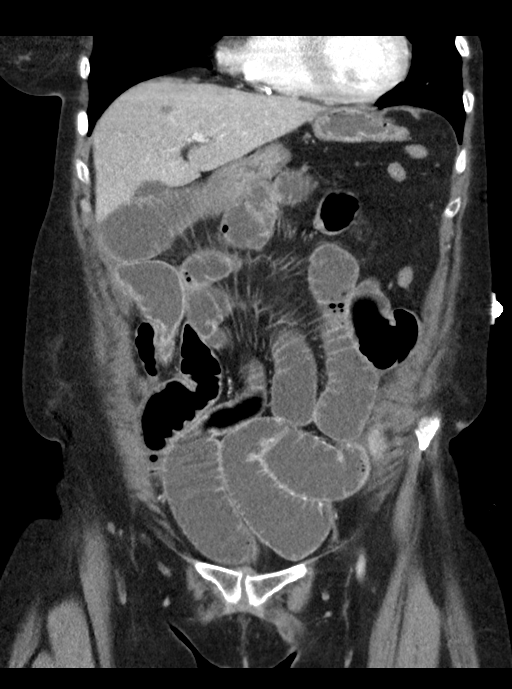
[im 34/77  soft-tissue]
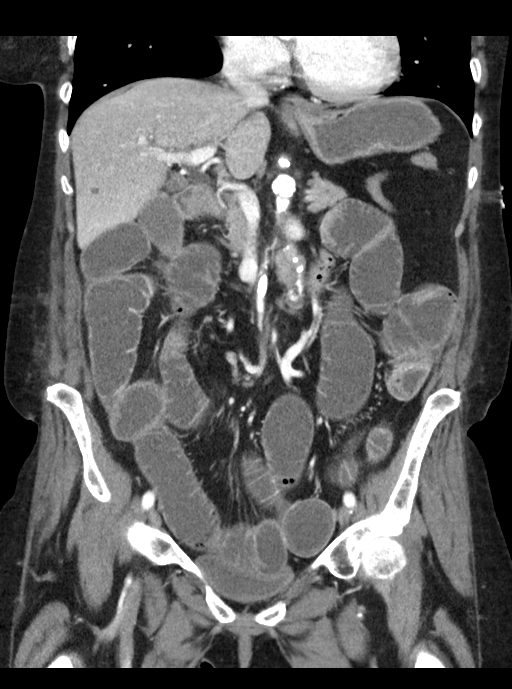
[im 43/77  soft-tissue]
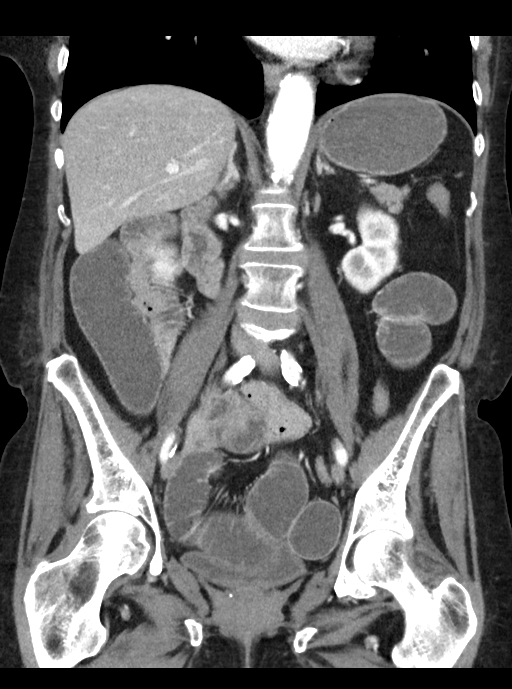

[15 of 46 positions shown; findings below may reference images not displayed]

FINDINGS: Lower chest: No pleural fluid or consolidation.

Hepatobiliary: Unchanged subcentimeter low-density in the central
liver likely cyst. Small subcentimeter subcapsular hypodensity in
the right lobe is also unchanged but nonspecific. No new hepatic
lesion. Gallbladder physiologically distended without wall
thickening or calcified gallstone. Borderline biliary dilatation
with common bile duct measuring 7 mm distally, similar to prior
exam.

Pancreas: Parenchymal atrophy.

Spleen: No ductal dilatation or inflammation. Slightly small in size
without focal abnormality.

Adrenals/Urinary Tract: Normal adrenal glands. No hydronephrosis,
perinephric edema, or suspicious renal mass. Small cysts in the left
kidney. Urinary bladder is nondistended.

Stomach/Bowel: Fluid distended distal esophagus. Fluid-filled
dilated stomach. Relative decompression of the duodenum and proximal
jejunum. The remaining small bowel loops are diffusely dilated,
fluid-filled with fluid levels. Small bowel is dilated to the level
of the right lower quadrant ileostomy, with mild bowel wall
enhancement of the small bowel in the ostomy and subcutaneous
tissues. Mesenteric edema involving small bowel primarily in the
right abdomen. Tiny peristomal hernia. No pneumatosis or
perforation. Diverting transverse colostomy in the left abdomen with
decompressed in situ colon.

Vascular/Lymphatic: Advanced vascular calcifications of the
abdominal aorta, iliac arteries, and aortic branch vessels.
Mesenteric vessels are patent. No mesenteric or portal venous gas.
No aneurysm. Circumaortic left renal vein. No abdominopelvic
adenopathy.

Reproductive: Uterine fundal fibroid.  No adnexal mass.

Other: Improved ascites from prior exam with trace free fluid
suspected in the right lower quadrant. No free air. Prior laparotomy
with abdominal wall scarring.

Musculoskeletal: There are no acute or suspicious osseous
abnormalities. Mild chronic superior endplate compression fracture
of lower most lumbar vertebra (patient past 4 non-rib-bearing lumbar
vertebra).
IMPRESSION: 1. Diffusely dilated small bowel with fluid levels, suspicious for
obstruction, however there is no transition point and small bowel is
dilated and fluid-filled throughout to the ileostomy in the right
lower quadrant. Atypical appearance of small bowel obstruction is
favored given the degree of small bowel distention, however
generalized ileus or enteritis are also considered. Improved ascites
from prior exam.
2. Advanced aortic and branch atherosclerosis without acute vascular
finding. Aortic Atherosclerosis ([id number]).

## 2019-12-02 IMAGING — CR DG ABDOMEN 1V
1 series · 1 of 1 positions shown · non-contrast
Comparison: 07/07/2018, 07/06/2018, CT 07/05/2018

CLINICAL DATA: Obstruction history of SBO

EXAM:
ABDOMEN - 1 VIEW

[abdomen kub]
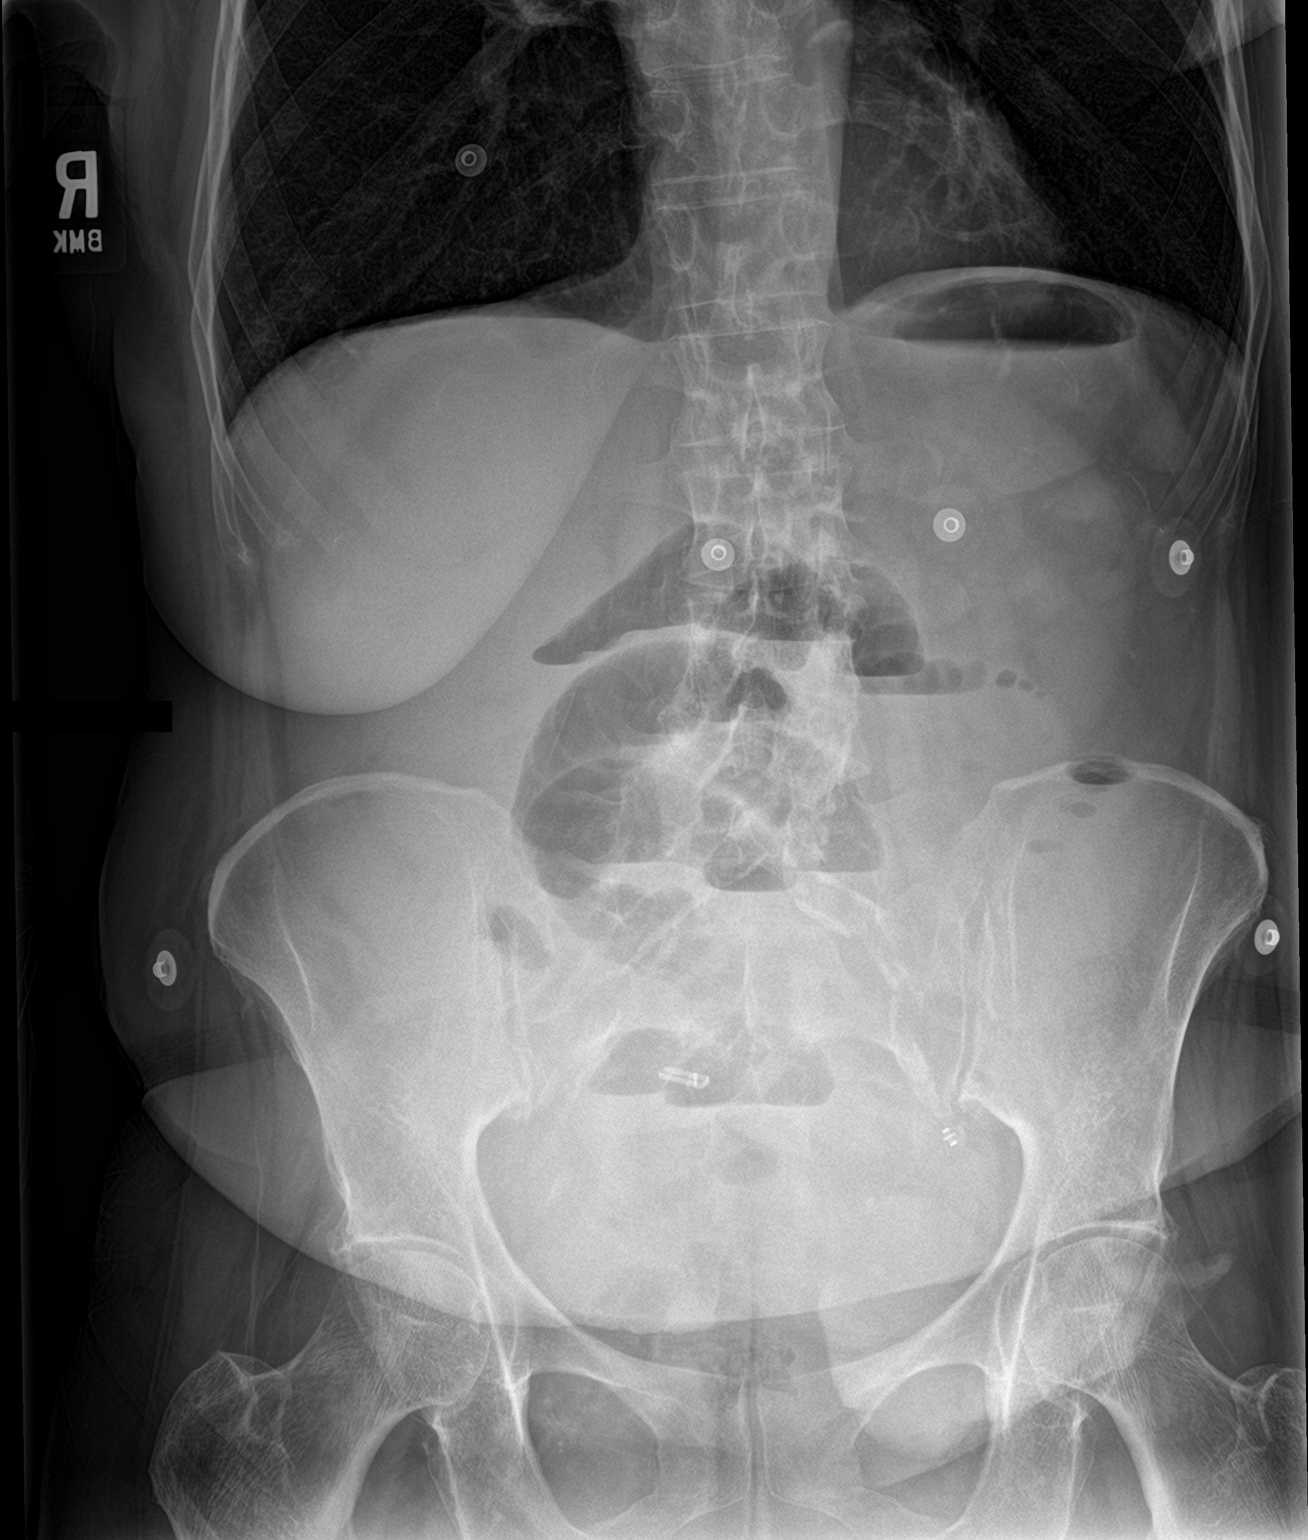

[1 of 1 positions shown; findings below may reference images not displayed]

FINDINGS: Lung bases are clear. No free air beneath the diaphragm. Persistent
dilatation of central small bowel up to 3 cm with multiple fluid
levels and absence of distal bowel gas consistent with a bowel
obstruction. Clips in the pelvis.
IMPRESSION: Centralized dilated small bowel loops with fluid levels consistent
with small bowel obstruction.

## 2019-12-03 ENCOUNTER — Inpatient Hospital Stay: Payer: Medicare Other

## 2019-12-03 ENCOUNTER — Inpatient Hospital Stay: Payer: Medicare Other | Admitting: Internal Medicine

## 2019-12-03 ENCOUNTER — Ambulatory Visit: Payer: Medicaid Other | Admitting: Radiation Oncology

## 2019-12-03 ENCOUNTER — Telehealth: Payer: Self-pay

## 2019-12-03 NOTE — Telephone Encounter (Signed)
Nutrition  Called patient for nutrition follow-up.  No answer. Left message on voicemail with callback number.  Corvin Sorbo B. Zenia Resides, [place], [place] Registered Dietitian [phone] (pager)

## 2019-12-04 IMAGING — DX DG ABD PORTABLE 2V
2 series · 2 of 2 positions shown · non-contrast
Comparison: 07/17/2018 and previous

CLINICAL DATA: Small bowel obstruction

EXAM:
PORTABLE ABDOMEN - 2 VIEW

[abdomen erect]
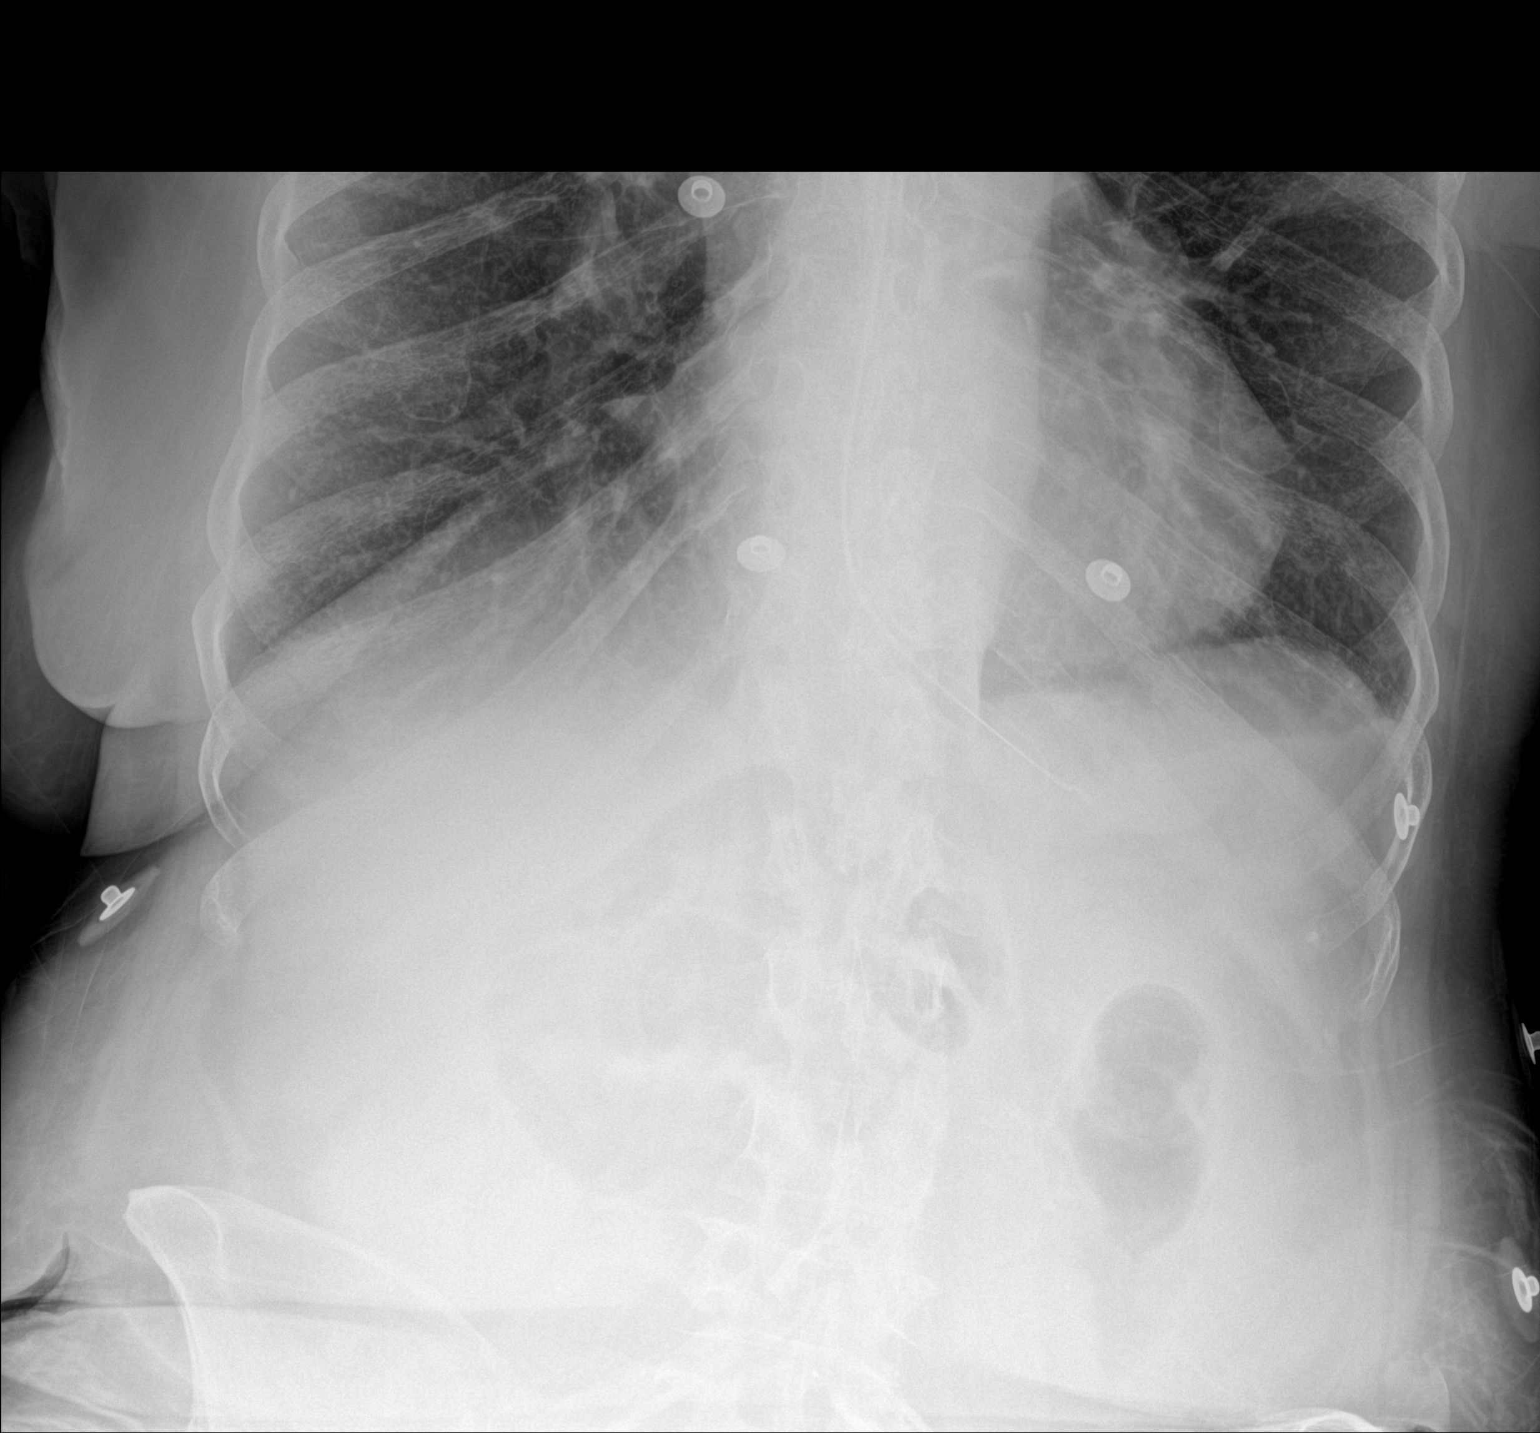

[abdomen supine]
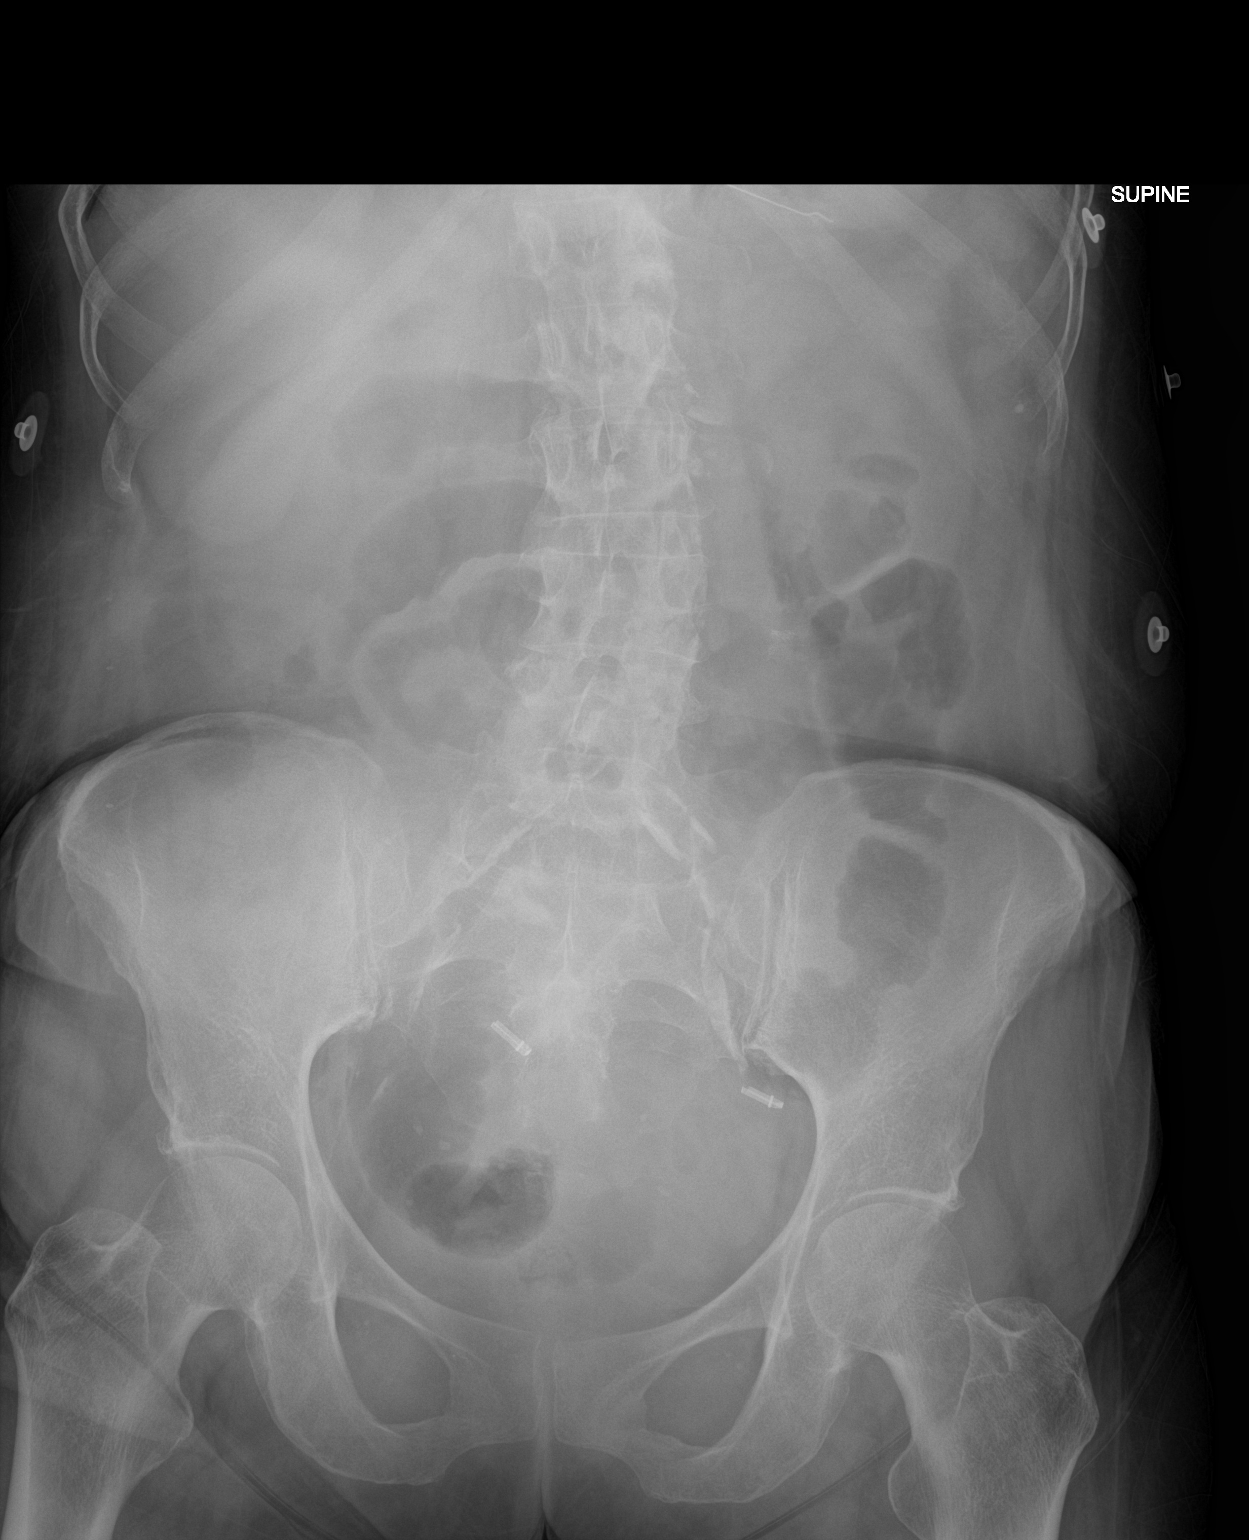

[2 of 2 positions shown; findings below may reference images not displayed]

FINDINGS: Nasogastric tube remains with its tip just beyond the GE junction.
The stomach is nondistended. A few gas distended small bowel loops
in the mid abdomen, with perhaps slightly decreased distension since
previous exam. Colon is nondilated.

Moderate aortoiliac arterial calcifications. Bilateral tubal
ligation clips. Regional bones unremarkable. Visualized lung bases
clear.
IMPRESSION: 1. Is gastric tube just across the GE junction.
2. Slight decrease in degree of distention of mid abdominal small
bowel loops.

## 2019-12-05 IMAGING — DX DG ABD PORTABLE 2V
2 series · 2 of 2 positions shown · non-contrast
Comparison: 07/19/2018

CLINICAL DATA: Small bowel obstruction

EXAM:
PORTABLE ABDOMEN - 2 VIEW

[abdomen erect]
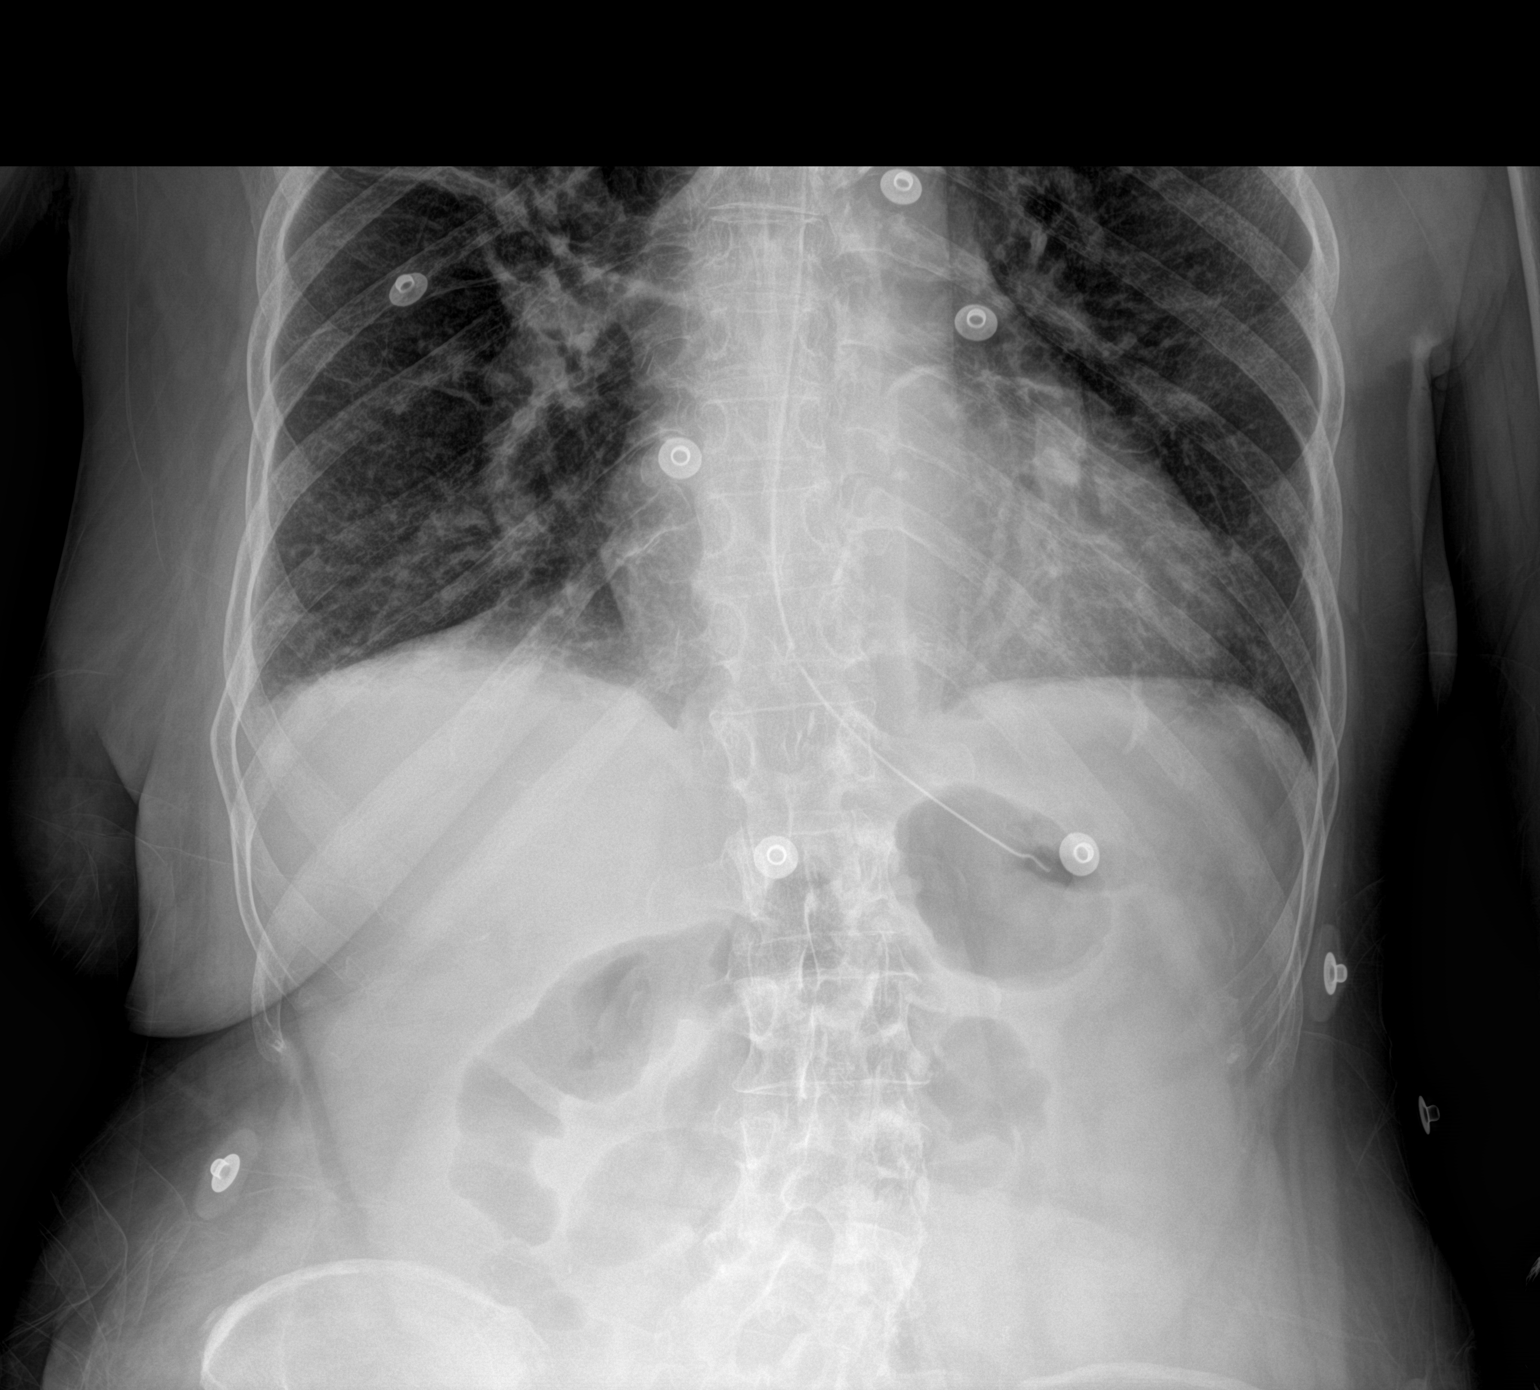

[abdomen supine]
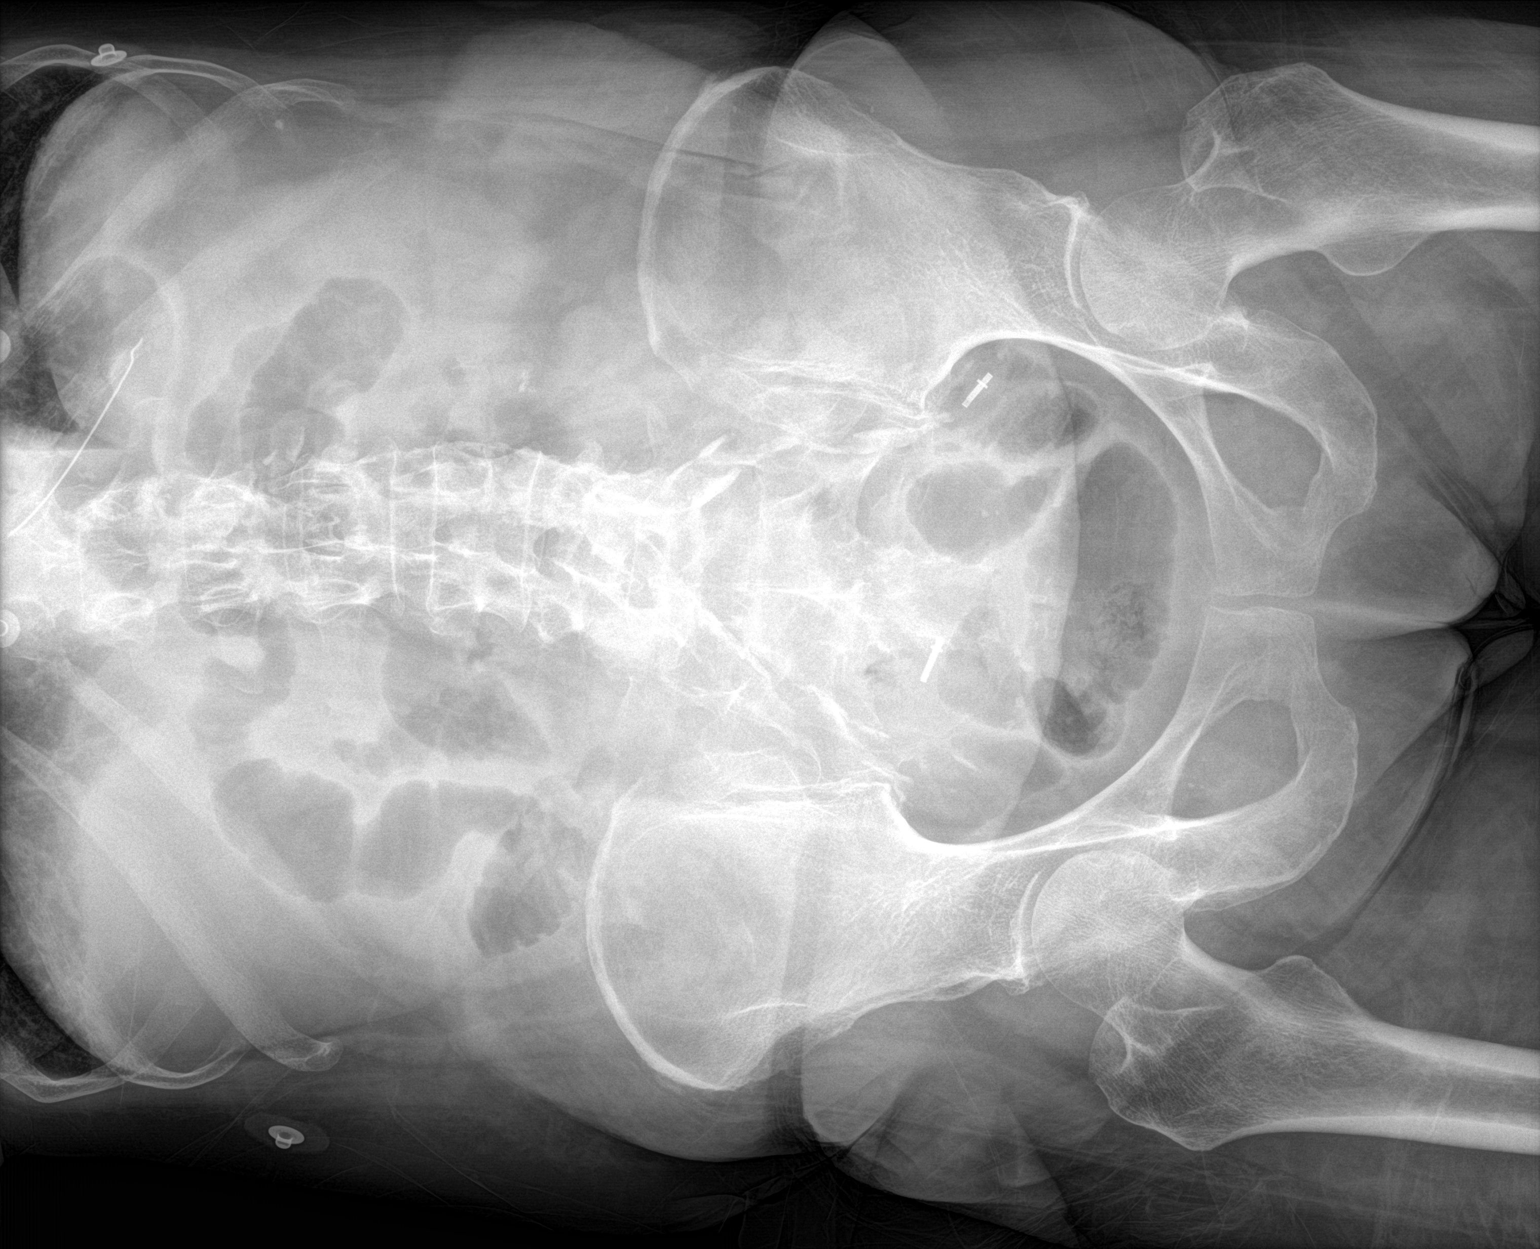

[2 of 2 positions shown; findings below may reference images not displayed]

FINDINGS: NG tube tip is in the proximal stomach with the side port in the
distal esophagus. Mildly prominent small bowel loops in the mid
abdomen are similar to prior study. No organomegaly or free air.
IMPRESSION: NG tube tip remains in the proximal stomach with the side port in
the distal esophagus.

Mildly prominent small bowel loops, unchanged.

## 2019-12-10 ENCOUNTER — Inpatient Hospital Stay: Payer: Medicare Other

## 2019-12-10 ENCOUNTER — Inpatient Hospital Stay (HOSPITAL_BASED_OUTPATIENT_CLINIC_OR_DEPARTMENT_OTHER): Payer: Medicare Other | Admitting: Internal Medicine

## 2019-12-10 ENCOUNTER — Other Ambulatory Visit: Payer: Self-pay

## 2019-12-10 DIAGNOSIS — C155 Malignant neoplasm of lower third of esophagus: Secondary | ICD-10-CM | POA: Diagnosis not present

## 2019-12-10 LAB — CBC WITH DIFFERENTIAL/PLATELET
Abs Immature Granulocytes: 0.02 10*3/uL (ref 0.00–0.07)
Basophils Absolute: 0.1 10*3/uL (ref 0.0–0.1)
Basophils Relative: 1 %
Eosinophils Absolute: 0.4 10*3/uL (ref 0.0–0.5)
Eosinophils Relative: 6 %
HCT: 31.9 % — ABNORMAL LOW (ref 36.0–46.0)
Hemoglobin: 10.1 g/dL — ABNORMAL LOW (ref 12.0–15.0)
Immature Granulocytes: 0 %
Lymphocytes Relative: 21 %
Lymphs Abs: 1.5 10*3/uL (ref 0.7–4.0)
MCH: 30.2 pg (ref 26.0–34.0)
MCHC: 31.7 g/dL (ref 30.0–36.0)
MCV: 95.5 fL (ref 80.0–100.0)
Monocytes Absolute: 0.4 10*3/uL (ref 0.1–1.0)
Monocytes Relative: 6 %
Neutro Abs: 4.7 10*3/uL (ref 1.7–7.7)
Neutrophils Relative %: 66 %
Platelets: 233 10*3/uL (ref 150–400)
RBC: 3.34 MIL/uL — ABNORMAL LOW (ref 3.87–5.11)
RDW: 15.8 % — ABNORMAL HIGH (ref 11.5–15.5)
WBC: 7.2 10*3/uL (ref 4.0–10.5)
nRBC: 0 % (ref 0.0–0.2)

## 2019-12-10 LAB — SAMPLE TO BLOOD BANK

## 2019-12-10 LAB — BASIC METABOLIC PANEL
Anion gap: 10 (ref 5–15)
BUN: 32 mg/dL — ABNORMAL HIGH (ref 8–23)
CO2: 22 mmol/L (ref 22–32)
Calcium: 9.3 mg/dL (ref 8.9–10.3)
Chloride: 108 mmol/L (ref 98–111)
Creatinine, Ser: 2.12 mg/dL — ABNORMAL HIGH (ref 0.44–1.00)
GFR calc Af Amer: 28 mL/min — ABNORMAL LOW (ref >60)
GFR calc non Af Amer: 24 mL/min — ABNORMAL LOW (ref >60)
Glucose, Bld: 81 mg/dL (ref 70–99)
Potassium: 5.5 mmol/L — ABNORMAL HIGH (ref 3.5–5.1)
Sodium: 140 mmol/L (ref 135–145)

## 2019-12-10 NOTE — Assessment & Plan Note (Addendum)
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  Currently s/p definitive radiation [dec 7th 2020].  Clinically stable.  Posttreatment PET scan February 2021-no evidence of any metastatic disease.  No uptake in the esophagus region.  Continue surveillance.  Will defer to GI regarding surveillance endoscopies.  #Severe anemia-likely secondary to CKD stage IV-currently on Retacrit; hemoglobin today is 10.3 hold Retacrit today.  #CKD stage IV-clinically stable.  # Hyperkalemia- K 5.5-not on NSAIDs/ACE inhibitor's.  Patient has appointment with nephrology next week.  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  Stable.  # I spoke at length with the patient's daughter Ms. Edison Pace regarding the patient's clinical status/plan of care.  Family agreement.   # DISPOSITION: # HOLD retacrit today # follow up in 4 weeks- cbc/bmp; possible Retacrit;-Dr.B  # I reviewed the blood work- with the patient in detail; also reviewed the imaging independently [as summarized above]; and with the patient in detail.

## 2019-12-10 NOTE — Progress Notes (Signed)
[place] OFFICE PROGRESS NOTE  Patient Care Team: [facility] [place] as PCP - General ([place]) [name], FNP as Nurse Practitioner (Family Medicine) [name], NP as Nurse Practitioner (Nurse Practitioner) [name], RN as Registered Nurse [name], RN as Registered Nurse [name], RN as Oncology Nurse Navigator  Cancer Staging Breast cancer in female [facility]) Staging form: Breast, AJCC 7th Edition - Clinical: Stage IIIA (T3, N1, M0) - Signed by [name], NP on 02/21/2016    Oncology History Overview Note  # 2016- JAN RUL 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 A. LUNG MASS, RIGHT; CT-GUIDED BIOPSY: - ADENOCARCINOMA, ACINAR AND PAPILLARY MORPHOLOGIES. S/p RT [finished Dec 2017] s/p RT [DEC 2017]; AUG 2019- PET-right middle lobe radiation changes no evidence of recurrence.  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  T1b- submucosal involvement; N0- stage I [[name]; [facility]]; unable to proceed with Endo mucosal resection-because of extensive disease [Dr.Branch; [facility]]; recommend definitive radiation [finished  RT- 09/21/2019]   #September 2020 worsening macrocytic anemia--bone marrow biopsy shows plasma cells up to 5%; unlikely to explain patient's anemia.  MDS is still a possibility-if anemia worsens would recommend peripheral blood NGS.  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]  # 2019-July-aug 2019-ischemic colitis status post right  hemicolectomy [Dr.[name]]; acute renal failure;  -----------------------------------------------------------------    DIAGNOSIS: RML Lung ca adeno [stage I] #squamous cell carcinoma esophagus -stage I   CURRENT/MOST RECENT THERAPY: Status post radiation       Breast cancer in female [facility])  07/24/1999 Initial Diagnosis   Breast cancer in female ([place]) T3 N1 M0 tumor ER/PR positive   08/24/1999 -  Anti-estrogen oral therapy   Started tamoxifen   08/24/1999 Surgery   Status post modified radical mastectomy of left breast    Chemotherapy      Radiation Therapy     11/24/2003 - 02/20/2009 Anti-estrogen oral therapy   Started Aromasin   Carcinoma of overlapping sites of left breast in female, estrogen receptor positive ([place])  06/22/2016 Initial Diagnosis   Cancer of overlapping sites of left female breast ([place])   Malignant neoplasm of upper lobe of right lung ([place])  Malignant neoplasm of lower third of esophagus ([place])  05/01/2019 Initial Diagnosis   Malignant neoplasm of lower third of esophagus ([place])     INTERVAL HISTORY:  Danielle [name] 64 y.o.  female pleasant patient above history of multiple malignancies including most recent stage I squamous cell cancer of esophagus currently status post radiation; and history of severe anemia is here for follow-up/review results of the PET scan.  Patient interim has not had any hospitalizations.  Appetite is good.  Denies any dizziness.  No nausea no vomiting.  Abdominal ostomy is improved.  Denies any difficulty swallowing.   Review of Systems  Constitutional: Positive for malaise/fatigue and weight loss. Negative for chills, diaphoresis and fever.  HENT: Negative for nosebleeds and sore throat.   Eyes: Negative for double vision.  Respiratory: Positive for cough and shortness of  breath. Negative for hemoptysis, sputum production and wheezing.   Cardiovascular: Negative for chest pain, palpitations, orthopnea and leg swelling.   Gastrointestinal: Negative for abdominal pain, blood in stool, constipation, heartburn, melena and vomiting.  Genitourinary: Negative for dysuria, frequency and urgency.  Musculoskeletal: Negative for back pain and joint pain.  Skin: Negative.  Negative for itching and rash.  Neurological: Negative for tingling, focal weakness, weakness and headaches.  Endo/Heme/Allergies: Does not bruise/bleed easily.  Psychiatric/Behavioral: Negative for depression. The patient is not nervous/anxious and does not have insomnia.       PAST MEDICAL HISTORY :  Past Medical History:  Diagnosis Date  . Anxiety   . Arthritis   . Breast cancer ([place]) 2000  . Cancer ([place]) left    breast cancer 2000, chemo tx's with total mastectomy and lymph nodes resected.   . Cancer of right lung ([place]) 02/21/2016   rad tx's.   . CHF (congestive heart failure) ([place])   . COPD (chronic obstructive pulmonary disease) ([place])   . Dependence on supplemental oxygen   . Depression   . Heart murmur   . Hypertension   . Lung nodule   . Lymphedema   . Personal history of chemotherapy   . Personal history of radiation therapy   . Shortness of breath dyspnea    with exertion  . Status post chemotherapy 2001   left breast cancer  . Status post radiation therapy 2001   left breast cancer    PAST SURGICAL HISTORY :   Past Surgical History:  Procedure Laterality Date  . Breast Biospy Left    [facility]  . BREAST SURGERY    . COLONOSCOPY N/A 04/30/2018   Procedure: COLONOSCOPY;  Surgeon: Virgel Manifold, MD;  Location: [facility] ENDOSCOPY;  Service: Endoscopy;  Laterality: N/A;  . COLONOSCOPY N/A 07/22/2018   Procedure: COLONOSCOPY;  Surgeon: Virgel Manifold, MD;  Location: [facility] ENDOSCOPY;  Service: Endoscopy;  Laterality: N/A;  . DILATION AND CURETTAGE OF UTERUS    . ELECTROMAGNETIC NAVIGATION BROCHOSCOPY Right 04/11/2016   Procedure: ELECTROMAGNETIC NAVIGATION BRONCHOSCOPY;  Surgeon: Vilinda Boehringer, MD;  Location: [facility] ORS;   Service: Cardiopulmonary;  Laterality: Right;  . ESOPHAGOGASTRODUODENOSCOPY N/A 07/22/2018   Procedure: ESOPHAGOGASTRODUODENOSCOPY (EGD);  Surgeon: Virgel Manifold, MD;  [facility] ENDOSCOPY;  Service: Endoscopy;  Laterality: N/A;  . ESOPHAGOGASTRODUODENOSCOPY (EGD) WITH PROPOFOL N/A 05/07/2018   Procedure: ESOPHAGOGASTRODUODENOSCOPY (EGD) WITH PROPOFOL;  Surgeon: Lucilla Lame, MD;  Location: [facility] ENDOSCOPY;  Service: Endoscopy;  Laterality: N/A;  . ESOPHAGOGASTRODUODENOSCOPY (EGD) WITH PROPOFOL N/A 04/24/2019   Procedure: ESOPHAGOGASTRODUODENOSCOPY (EGD) WITH PROPOFOL;  Surgeon: Jonathon Bellows, MD;  Location: [facility] ENDOSCOPY;  Service: Gastroenterology;  Laterality: N/A;  . EUS N/A 05/07/2019   Procedure: FULL UPPER ENDOSCOPIC ULTRASOUND (EUS) RADIAL;  Surgeon: Jola Schmidt, MD;  Location: [facility] ENDOSCOPY;  Service: Endoscopy;  Laterality: N/A;  . history of colonoscopy]    . ILEOSCOPY N/A 07/22/2018   Procedure: ILEOSCOPY THROUGH STOMA;  Surgeon: Virgel Manifold, MD;  Location: [facility] ENDOSCOPY;  Service: Endoscopy;  Laterality: N/A;  . ILEOSTOMY    . ILEOSTOMY N/A 09/08/2018   Procedure: ILEOSTOMY REVISION POSSIBLE CREATION;  Surgeon: Herbert Pun, MD;  Location: [facility] ORS;  Service: General;  Laterality: N/A;  . ILEOSTOMY CLOSURE N/A 08/15/2018   Procedure: DILATION OF ILEOSTOMY STRICTURE;  Surgeon: Herbert Pun, MD;  Location: [facility] ORS;  Service: General;  Laterality: N/A;  . LAPAROTOMY Right 05/04/2018   Procedure: EXPLORATORY LAPAROTOMY right colectomy right and left ostomy;  Surgeon: Herbert Pun,  MD;  Location: [facility] ORS;  Service: General;  Laterality: Right;  . LUNG BIOPSY    . MASTECTOMY Left    2000, [facility]  . ROTATOR CUFF REPAIR Right    [facility]    FAMILY HISTORY :   Family History  Problem Relation Age of Onset  . Breast cancer Mother 66  . Cancer Mother        Breast   . Cirrhosis Father   . Breast cancer Paternal Aunt 7  . Cancer Maternal Aunt         Breast     SOCIAL HISTORY:   Social History   Tobacco Use  . Smoking status: Former Smoker    Packs/day: 0.50    Years: 20.00    Pack years: 10.00    Types: Cigarettes    Quit date: 07/02/2012    Years since quitting: 7.4  . Smokeless tobacco: Current User    Types: Snuff  . Tobacco comment: quit 2014  Substance Use Topics  . Alcohol use: Not Currently    Comment: Occasionally  . Drug use: No    ALLERGIES:  has No Known Allergies.  MEDICATIONS:  Current Outpatient Medications  Medication Sig Dispense Refill  . acetaminophen (TYLENOL) 325 MG tablet Take 2 tablets (650 mg total) by mouth every 6 (six) hours as needed for mild pain (or Fever >/= 101).    [name] albuterol (PROVENTIL HFA;VENTOLIN HFA) 108 (90 Base) MCG/ACT inhaler Inhale 2 puffs into the lungs every 6 (six) hours as needed for wheezing or shortness of breath. 1 Inhaler 2  . calcium carbonate (TUMS - DOSED IN MG ELEMENTAL CALCIUM) 500 MG chewable tablet Chew 0.5 tablets (100 mg of elemental calcium total) by mouth daily.    . calcium-vitamin D (OSCAL WITH D) 250-125 MG-UNIT tablet Take 1 tablet by mouth daily.    . carvedilol (COREG) 6.25 MG tablet Take 1 tablet (6.25 mg total) by mouth 2 (two) times daily with a meal. 60 tablet 0  . citalopram (CELEXA) 20 MG tablet Take 40 mg by mouth daily.     . ferrous sulfate 325 (65 FE) MG tablet Take 1 tablet (325 mg total) by mouth 2 (two) times daily with a meal. 60 tablet 3  . Fluticasone-Umeclidin-Vilant 100-62.5-25 MCG/INH AEPB Inhale 1 puff into the lungs daily.     [name] guaiFENesin-dextromethorphan (ROBITUSSIN DM) 100-10 MG/5ML syrup Take 5 mLs by mouth every 4 (four) hours as needed for cough.    . loperamide (IMODIUM A-D) 2 MG tablet Take 1 tablet (2 mg total) by mouth as needed for diarrhea or loose stools. 30 tablet 0  . Multiple Vitamin (MULTIVITAMIN WITH MINERALS) TABS tablet Take 1 tablet by mouth daily. 30 tablet 1  . Nutritional Supplements (FEEDING SUPPLEMENT,  NEPRO CARB STEADY,) LIQD Take 237 mLs by mouth 3 (three) times daily between meals. 21330 mL 0  . pantoprazole (PROTONIX) 40 MG tablet Take 1 tablet (40 mg total) by mouth 2 (two) times daily before a meal. 60 tablet 0  . sodium bicarbonate 650 MG tablet Take 2 tablets (1,300 mg total) by mouth 3 (three) times daily. 180 tablet 0  . sucralfate (CARAFATE) 1 GM/10ML suspension Take 10 mLs (1 g total) by mouth 4 (four) times daily. 1200 mL 2  . torsemide (DEMADEX) 20 MG tablet Take 1 tablet by mouth as needed.     No current facility-administered medications for this visit.    PHYSICAL EXAMINATION: ECOG PERFORMANCE STATUS: 1 - Symptomatic but completely  ambulatory  BP (!) 164/90 (BP Location: Left Arm, Patient Position: Sitting)   Pulse 87   Temp (!) 96.8 F (36 C) (Tympanic)   Resp 18   Wt 125 lb (56.7 kg)   SpO2 100%   BMI 20.18 kg/m   Filed Weights   12/10/19 1106  Weight: 125 lb (56.7 kg)    Physical Exam  Constitutional: She is oriented to person, place, and time.  Thin built moderately nourished female patient.  She is walking herself.  She is alone.  HENT:  Head: Normocephalic and atraumatic.  Mouth/Throat: Oropharynx is clear and moist. No oropharyngeal exudate.  Eyes: Pupils are equal, round, and reactive to light.  Cardiovascular: Normal rate and regular rhythm.  Pulmonary/Chest: No respiratory distress. She has no wheezes.  Decreased breath sounds bilaterally.  No wheeze or crackles.  Abdominal: Soft. Bowel sounds are normal. She exhibits no distension and no mass. There is no abdominal tenderness. There is no rebound and no guarding.  Musculoskeletal:        General: No tenderness or edema. Normal range of motion.     Cervical back: Normal range of motion and neck supple.  Neurological: She is alert and oriented to person, place, and time.  Skin: Skin is warm.  Psychiatric: Affect normal.       LABORATORY DATA:  I have reviewed the data as listed     Component Value Date/Time   NA 140 12/10/2019 1023   NA 132 (L) 02/09/2015 1100   K 5.5 (H) 12/10/2019 1023   K 3.8 02/09/2015 1100   CL 108 12/10/2019 1023   CL 95 (L) 02/09/2015 1100   CO2 22 12/10/2019 1023   CO2 29 02/09/2015 1100   GLUCOSE 81 12/10/2019 1023   GLUCOSE 105 (H) 02/09/2015 1100   BUN 32 (H) 12/10/2019 1023   BUN 16 02/09/2015 1100   CREATININE 2.12 (H) 12/10/2019 1023   CREATININE 0.81 02/09/2015 1100   CALCIUM 9.3 12/10/2019 1023   CALCIUM 9.1 02/09/2015 1100   PROT 9.5 (H) 10/29/2019 0906   PROT 7.7 02/09/2015 1100   ALBUMIN 4.5 10/29/2019 0906   ALBUMIN 4.3 02/09/2015 1100   AST 22 10/29/2019 0906   AST 29 02/09/2015 1100   ALT 17 10/29/2019 0906   ALT 20 02/09/2015 1100   ALKPHOS 66 10/29/2019 0906   ALKPHOS 69 02/09/2015 1100   BILITOT 0.6 10/29/2019 0906   BILITOT 0.9 02/09/2015 1100   GFRNONAA 24 (L) 12/10/2019 1023   GFRNONAA >60 02/09/2015 1100   GFRAA 28 (L) 12/10/2019 1023   GFRAA >60 02/09/2015 1100    No results found for: SPEP, UPEP  Lab Results  Component Value Date   WBC 7.2 12/10/2019   NEUTROABS 4.7 12/10/2019   HGB 10.1 (L) 12/10/2019   HCT 31.9 (L) 12/10/2019   MCV 95.5 12/10/2019   PLT 233 12/10/2019      Chemistry      Component Value Date/Time   NA 140 12/10/2019 1023   NA 132 (L) 02/09/2015 1100   K 5.5 (H) 12/10/2019 1023   K 3.8 02/09/2015 1100   CL 108 12/10/2019 1023   CL 95 (L) 02/09/2015 1100   CO2 22 12/10/2019 1023   CO2 29 02/09/2015 1100   BUN 32 (H) 12/10/2019 1023   BUN 16 02/09/2015 1100   CREATININE 2.12 (H) 12/10/2019 1023   CREATININE 0.81 02/09/2015 1100      Component Value Date/Time   CALCIUM 9.3 12/10/2019 1023  CALCIUM 9.1 02/09/2015 1100   ALKPHOS 66 10/29/2019 0906   ALKPHOS 69 02/09/2015 1100   AST 22 10/29/2019 0906   AST 29 02/09/2015 1100   ALT 17 10/29/2019 0906   ALT 20 02/09/2015 1100   BILITOT 0.6 10/29/2019 0906   BILITOT 0.9 02/09/2015 1100       RADIOGRAPHIC  STUDIES: I have personally reviewed the radiological images as listed and agreed with the findings in the report. No results found.   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  Currently s/p definitive radiation [dec 7th 2020].  Clinically stable.  Posttreatment PET scan February 2021-no evidence of any metastatic disease.  No uptake in the esophagus region.  Continue surveillance.  Will defer to GI regarding surveillance endoscopies.  #Severe anemia-likely secondary to CKD stage IV-currently on Retacrit; hemoglobin today is 10.3 hold Retacrit today.  #CKD stage IV-clinically stable.  # Hyperkalemia- K 5.5-not on NSAIDs/ACE inhibitor's.  Patient has appointment with nephrology next week.  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  Stable.  # I spoke at length with the patient's daughter Ms. Edison Pace regarding the patient's clinical status/plan of care.  Family agreement.   # DISPOSITION: # HOLD retacrit today # follow up in 4 weeks- cbc/bmp; possible Retacrit;-Dr.B   No orders of the defined types were placed in this encounter.  All questions were answered. The patient knows to call the clinic with any problems, questions or concerns.      [name], MD 12/11/2019 7:57 AM

## 2019-12-17 ENCOUNTER — Telehealth: Payer: Self-pay

## 2019-12-17 NOTE — Telephone Encounter (Signed)
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  Patient has completed radiation treatments.  Followed by Dr. Rogue Bussing.    Called patient again this afternoon for follow-up.  Patient reports that appetite has improved and she is eating more.  Reports that she is mostly eating 2 meals per day plus a snack.  Likes chicken, eggs, toast, vegetables.  Reports that kidney doctor wanted her to cut back on high potassium foods.    Medications: reviewed  Labs: reviewed  Anthropometrics:   Weight 125 lb increased from 121 lb on 12/7   NUTRITION DIAGNOSIS: Predicted suboptimal energy intake improving with completion of treatment   INTERVENTION:  Reviewed foods high in potassium.  Encouraged patient to continue eating good sources protein.  Patient has contact number and will contact RD if needed in the future.   NEXT VISIT: no follow-up, patient to contact  Burnett Lieber B. Zenia Resides, [place], [place] Registered Dietitian [phone] (pager)

## 2019-12-17 NOTE — Telephone Encounter (Signed)
Nutrition  Called patient for nutrition follow-up as no call back from previous message left.  Patient reports that her appetite is much better but can't talk as has to go to appointment with kidney doctor.  Will try and reach patient at a later time.  Giovanne Nickolson B. Zenia Resides, [place], [place] Registered Dietitian [phone] (pager)

## 2019-12-22 ENCOUNTER — Telehealth: Payer: Self-pay

## 2019-12-22 NOTE — Telephone Encounter (Signed)
Called pt to inform her of Dr. Georgeann Oppenheim recommendation for pt to have a repeat upper endoscopy.   Unable to contact, LVM to return call

## 2019-12-22 NOTE — Telephone Encounter (Signed)
-----   Message from Jonathon Bellows, MD sent at 12/22/2019 12:11 PM EST ----- Sherald Hess   Please inform patient that is probably time to perform an upper endoscopy to see what is the status of her cancer.  If she is willing to proceed please schedule if she wishes to discuss please set up a telephone visit  C/c Dr Rogue Bussing   Dr Jonathon Bellows MD,MRCP [facility]) Gastroenterology/Hepatology Pager: [phone]   ----- Message ----- From: [name], MD Sent: 12/18/2019   4:41 PM EST To: Jonathon Bellows, MD, Rushie Chestnut, [place] agree with you plan for endo when ever you think she needs one- GB ----- Message ----- From: Jonathon Bellows, MD Sent: 12/16/2019   8:37 AM EST To: [name], MD, Rushie Chestnut, CMA  Good morningI think it has been a while since we have had a look down her esophagus.  It may not be a bad idea to have 1 look if you are in agreement.  RegardsKiran ----- Message ----- From: [name], MD Sent: 12/11/2019   7:59 AM EST To: Jonathon Bellows, MD  Hi Bailey Mech- just wanted to check with you re: plan for any surveillance endoscopies for this pt down the line?  Thanks GB

## 2019-12-23 ENCOUNTER — Other Ambulatory Visit: Payer: Self-pay

## 2019-12-23 DIAGNOSIS — C155 Malignant neoplasm of lower third of esophagus: Secondary | ICD-10-CM

## 2019-12-23 NOTE — Telephone Encounter (Signed)
Spoke with pt and informed her of Dr. Georgeann Oppenheim recommendation. Pt agrees and we have scheduled the procedure.

## 2019-12-28 IMAGING — CT CT ABD-PELV W/O CM
2 of 4 series · 16 of 46 positions shown, 18 images · non-contrast
Comparison: 07/17/2018 CT abdomen and pelvis

CLINICAL DATA: 62 y/o  F; abdominal pain at ostomy site.

EXAM:
CT ABDOMEN AND PELVIS WITHOUT CONTRAST
TECHNIQUE: Multidetector CT imaging of the abdomen and pelvis was performed
following the standard protocol without IV contrast.

[Series 2: routine abd/pel wo · axial · 0.62mm/px · z∈[-884,-569]mm · 13 of 69 slices shown, 15 images]
[im 3/69  soft-tissue]
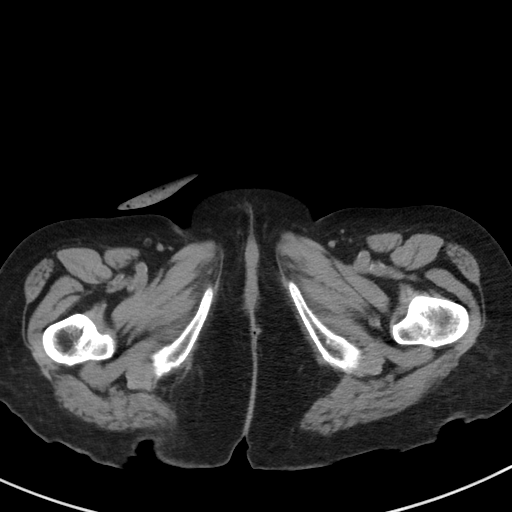
[im 3/69  bone]
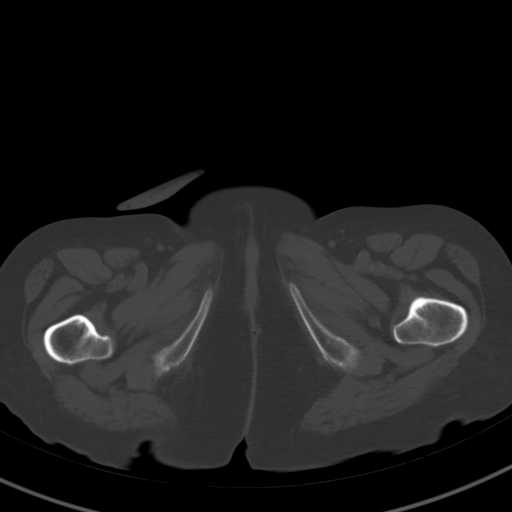
[im 9/69  soft-tissue]
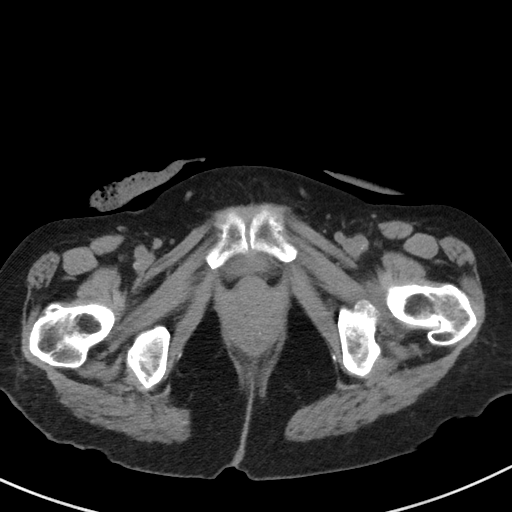
[im 15/69  soft-tissue]
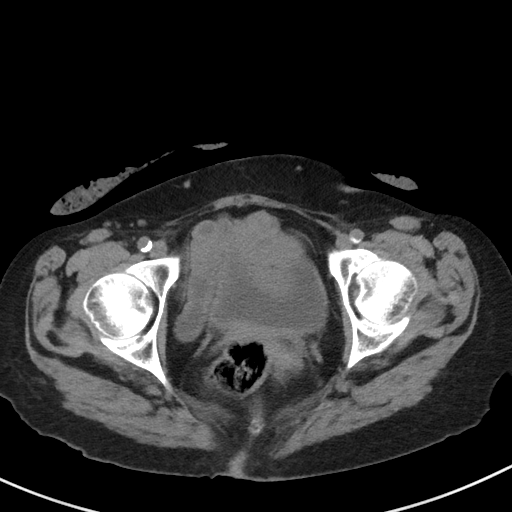
[im 18/69  soft-tissue]
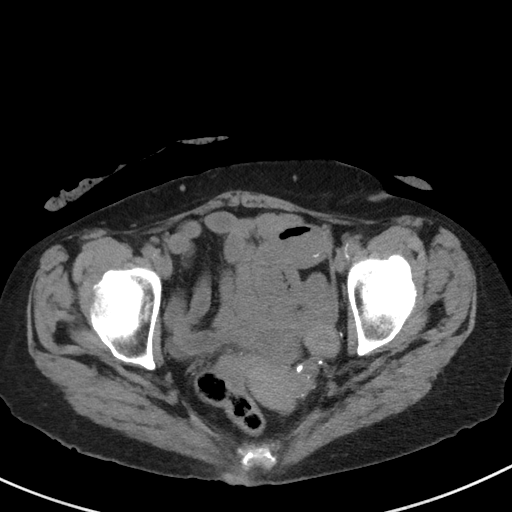
[im 24/69  soft-tissue]
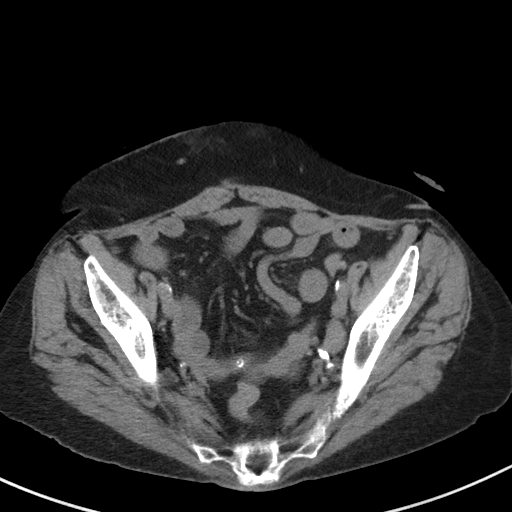
[im 30/69  soft-tissue]
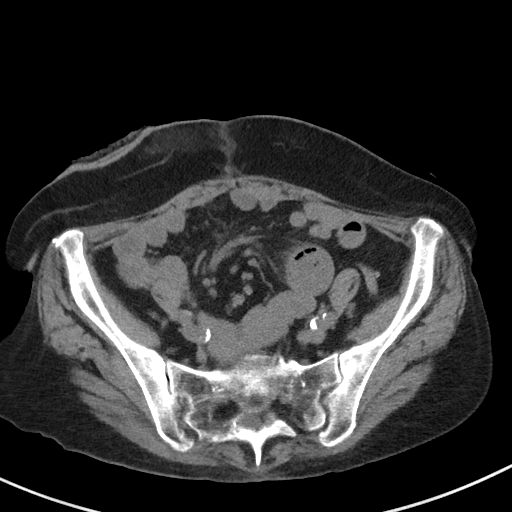
[im 36/69  soft-tissue]
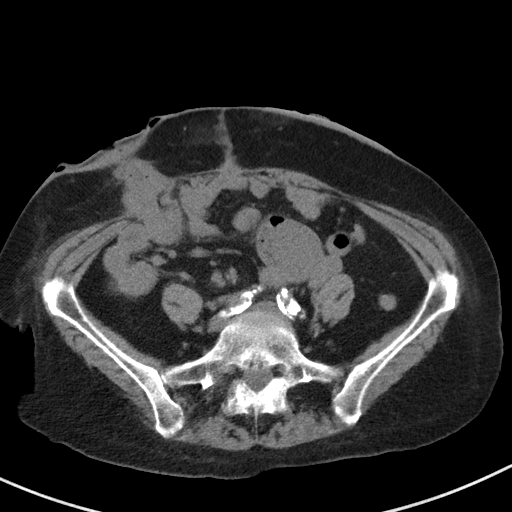
[im 39/69  soft-tissue]
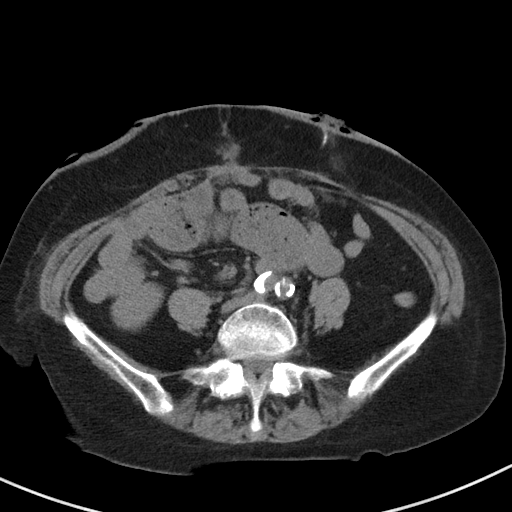
[im 45/69  soft-tissue]
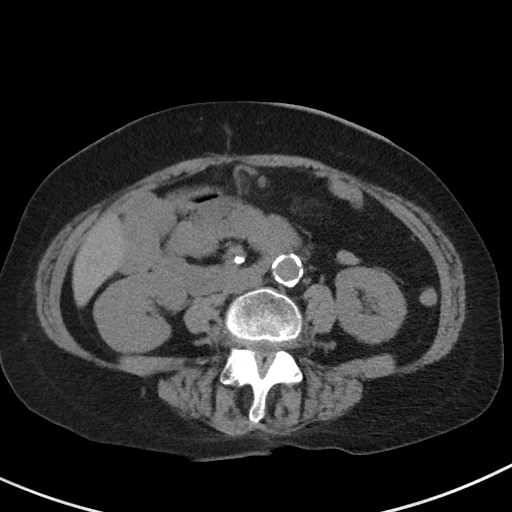
[im 45/69  bone]
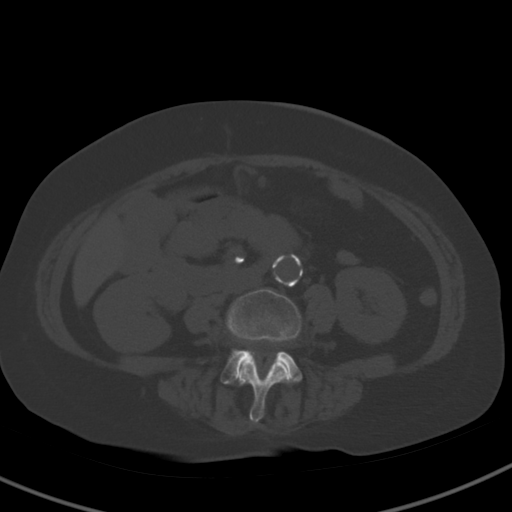
[im 51/69  soft-tissue]
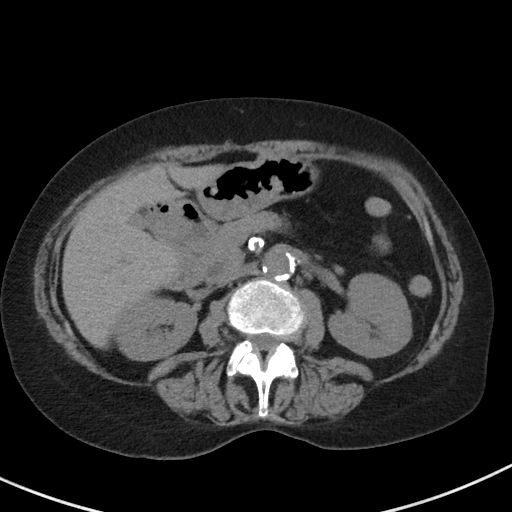
[im 54/69  soft-tissue]
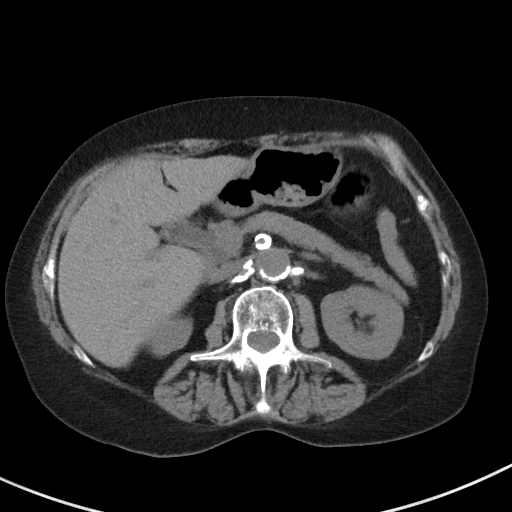
[im 60/69  soft-tissue]
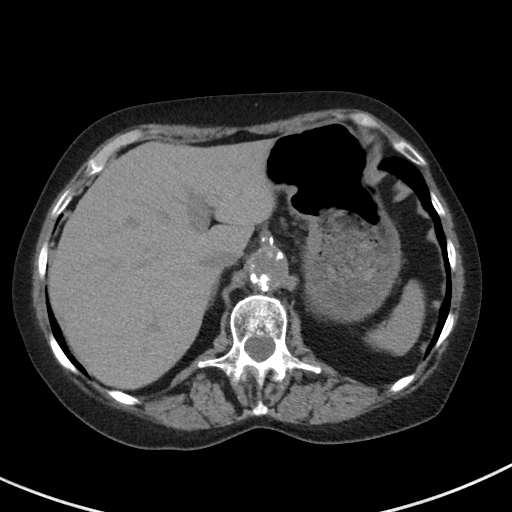
[im 66/69  soft-tissue]
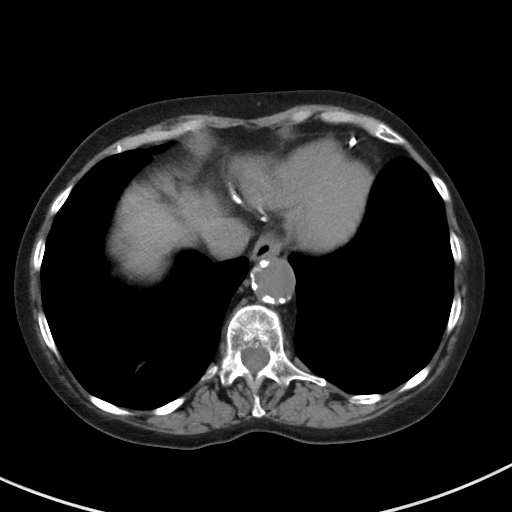

[Series 5: coronal st · coronal · 0.62mm/px · 3 of 81 slices shown]
[im 27/81  soft-tissue]
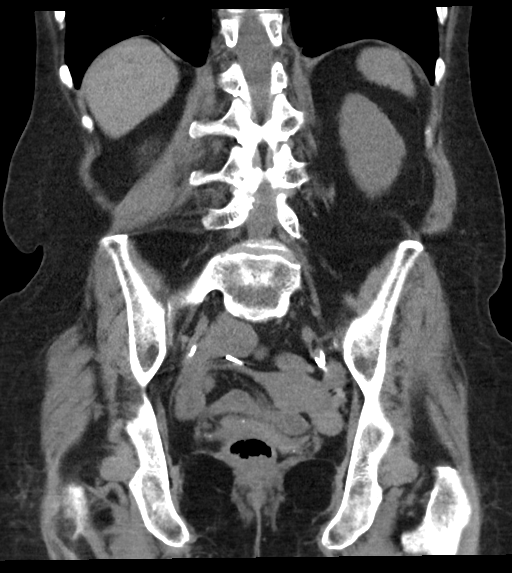
[im 36/81  soft-tissue]
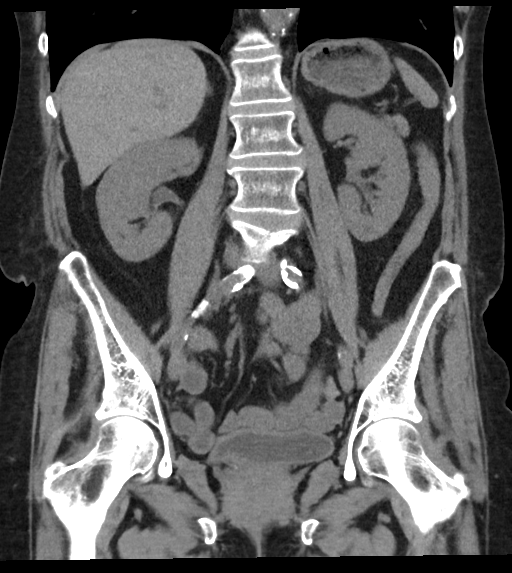
[im 45/81  soft-tissue]
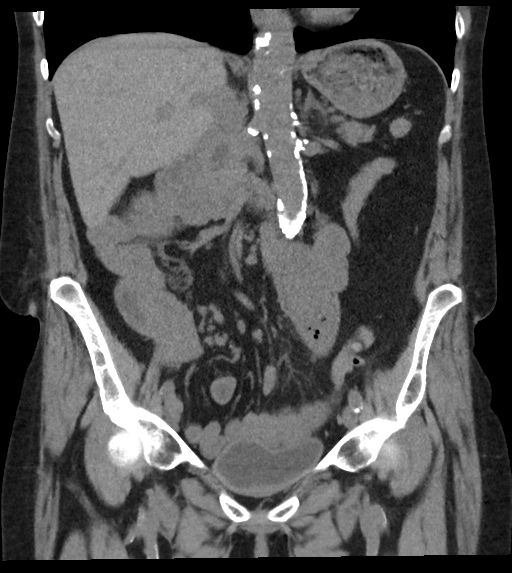

[16 of 46 positions shown; findings below may reference images not displayed]

FINDINGS: Lower chest: Calcific atherosclerosis of the coronary arteries.

Hepatobiliary: Stable subcentimeter level cysts in segment 4A and 5.
No additional focal liver lesion. Normal gallbladder. No biliary
ductal dilatation.

Pancreas: Unremarkable. No pancreatic ductal dilatation or
surrounding inflammatory changes.

Spleen: Normal in size without focal abnormality.

Adrenals/Urinary Tract: Adrenal glands are unremarkable. Kidneys are
normal, without renal calculi, focal lesion, or hydronephrosis.
Bladder is unremarkable.

Stomach/Bowel: Left lower quadrant diverting colostomy. Right lower
quadrant ileostomy. There is mild dilatation of the small bowel
upstream to the right lower quadrant ostomy site with fecalization
of stool contents. No bowel wall thickening or inflammatory changes.
No findings of perforation or abscess.

Vascular/Lymphatic: Aortic atherosclerosis. No enlarged abdominal or
pelvic lymph nodes.

Reproductive: Stable small uterus fundal fibroma. Uterus and
bilateral adnexa are otherwise unremarkable.

Other: No abdominal wall hernia or abnormality. No abdominopelvic
ascites.

Musculoskeletal: No acute fracture is seen. Stable mild lower lumbar
compression deformity with transitional lumbosacral anatomy.
IMPRESSION: 1. Mild dilatation of the small bowel upstream to the right lower
quadrant ostomy site with fecalization of stool contents probably
reflecting dysmotility or partial obstruction. No inflammatory
changes.
2. Stable chronic changes as above.

By: Asiel Anders M.D.

## 2020-01-07 ENCOUNTER — Other Ambulatory Visit: Payer: Self-pay

## 2020-01-07 ENCOUNTER — Inpatient Hospital Stay: Payer: Medicare Other | Attending: Internal Medicine

## 2020-01-07 ENCOUNTER — Inpatient Hospital Stay: Payer: Medicare Other

## 2020-01-07 ENCOUNTER — Other Ambulatory Visit: Payer: Self-pay | Admitting: *Deleted

## 2020-01-07 ENCOUNTER — Inpatient Hospital Stay (HOSPITAL_BASED_OUTPATIENT_CLINIC_OR_DEPARTMENT_OTHER): Payer: Medicare Other | Admitting: Internal Medicine

## 2020-01-07 VITALS — BP 150/83 | HR 85 | Temp 95.3°F | Wt 124.0 lb

## 2020-01-07 DIAGNOSIS — C155 Malignant neoplasm of lower third of esophagus: Secondary | ICD-10-CM

## 2020-01-07 DIAGNOSIS — Z87891 Personal history of nicotine dependence: Secondary | ICD-10-CM | POA: Diagnosis not present

## 2020-01-07 DIAGNOSIS — Z79899 Other long term (current) drug therapy: Secondary | ICD-10-CM | POA: Diagnosis not present

## 2020-01-07 DIAGNOSIS — I509 Heart failure, unspecified: Secondary | ICD-10-CM | POA: Insufficient documentation

## 2020-01-07 DIAGNOSIS — Z9981 Dependence on supplemental oxygen: Secondary | ICD-10-CM | POA: Diagnosis not present

## 2020-01-07 DIAGNOSIS — R911 Solitary pulmonary nodule: Secondary | ICD-10-CM | POA: Diagnosis not present

## 2020-01-07 DIAGNOSIS — Z85118 Personal history of other malignant neoplasm of bronchus and lung: Secondary | ICD-10-CM | POA: Diagnosis not present

## 2020-01-07 DIAGNOSIS — Z853 Personal history of malignant neoplasm of breast: Secondary | ICD-10-CM | POA: Diagnosis not present

## 2020-01-07 DIAGNOSIS — Z803 Family history of malignant neoplasm of breast: Secondary | ICD-10-CM | POA: Insufficient documentation

## 2020-01-07 DIAGNOSIS — Z9221 Personal history of antineoplastic chemotherapy: Secondary | ICD-10-CM | POA: Diagnosis not present

## 2020-01-07 DIAGNOSIS — I130 Hypertensive heart and chronic kidney disease with heart failure and stage 1 through stage 4 chronic kidney disease, or unspecified chronic kidney disease: Secondary | ICD-10-CM | POA: Diagnosis not present

## 2020-01-07 DIAGNOSIS — F329 Major depressive disorder, single episode, unspecified: Secondary | ICD-10-CM | POA: Diagnosis not present

## 2020-01-07 DIAGNOSIS — D631 Anemia in chronic kidney disease: Secondary | ICD-10-CM

## 2020-01-07 DIAGNOSIS — N184 Chronic kidney disease, stage 4 (severe): Secondary | ICD-10-CM | POA: Insufficient documentation

## 2020-01-07 DIAGNOSIS — D539 Nutritional anemia, unspecified: Secondary | ICD-10-CM

## 2020-01-07 DIAGNOSIS — R918 Other nonspecific abnormal finding of lung field: Secondary | ICD-10-CM | POA: Insufficient documentation

## 2020-01-07 DIAGNOSIS — J449 Chronic obstructive pulmonary disease, unspecified: Secondary | ICD-10-CM | POA: Diagnosis not present

## 2020-01-07 LAB — CBC WITH DIFFERENTIAL/PLATELET
Abs Immature Granulocytes: 0.03 10*3/uL (ref 0.00–0.07)
Basophils Absolute: 0.1 10*3/uL (ref 0.0–0.1)
Basophils Relative: 1 %
Eosinophils Absolute: 0.2 10*3/uL (ref 0.0–0.5)
Eosinophils Relative: 3 %
HCT: 30.7 % — ABNORMAL LOW (ref 36.0–46.0)
Hemoglobin: 10.1 g/dL — ABNORMAL LOW (ref 12.0–15.0)
Immature Granulocytes: 1 %
Lymphocytes Relative: 27 %
Lymphs Abs: 1.8 10*3/uL (ref 0.7–4.0)
MCH: 30.6 pg (ref 26.0–34.0)
MCHC: 32.9 g/dL (ref 30.0–36.0)
MCV: 93 fL (ref 80.0–100.0)
Monocytes Absolute: 0.6 10*3/uL (ref 0.1–1.0)
Monocytes Relative: 9 %
Neutro Abs: 3.9 10*3/uL (ref 1.7–7.7)
Neutrophils Relative %: 59 %
Platelets: 264 10*3/uL (ref 150–400)
RBC: 3.3 MIL/uL — ABNORMAL LOW (ref 3.87–5.11)
RDW: 16.2 % — ABNORMAL HIGH (ref 11.5–15.5)
WBC: 6.6 10*3/uL (ref 4.0–10.5)
nRBC: 0 % (ref 0.0–0.2)

## 2020-01-07 LAB — BASIC METABOLIC PANEL
Anion gap: 9 (ref 5–15)
BUN: 42 mg/dL — ABNORMAL HIGH (ref 8–23)
CO2: 21 mmol/L — ABNORMAL LOW (ref 22–32)
Calcium: 9.7 mg/dL (ref 8.9–10.3)
Chloride: 109 mmol/L (ref 98–111)
Creatinine, Ser: 2.14 mg/dL — ABNORMAL HIGH (ref 0.44–1.00)
GFR calc Af Amer: 27 mL/min — ABNORMAL LOW (ref >60)
GFR calc non Af Amer: 24 mL/min — ABNORMAL LOW (ref >60)
Glucose, Bld: 85 mg/dL (ref 70–99)
Potassium: 5 mmol/L (ref 3.5–5.1)
Sodium: 139 mmol/L (ref 135–145)

## 2020-01-07 LAB — SAMPLE TO BLOOD BANK

## 2020-01-07 NOTE — Progress Notes (Signed)
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 A. LUNG MASS, RIGHT; CT-GUIDED BIOPSY: - ADENOCARCINOMA, ACINAR AND PAPILLARY MORPHOLOGIES. S/p RT [finished Dec 2017] s/p RT [DEC 2017]; AUG 2019- PET-right middle lobe radiation changes no evidence of recurrence.  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  T1b- submucosal involvement; N0- stage I [[name]; [facility]]; unable to proceed with Endo mucosal resection-because of extensive disease [Dr.Branch; [facility]]; recommend definitive radiation [finished  RT- 09/21/2019]   #September 2020 worsening macrocytic anemia--bone marrow biopsy shows plasma cells up to 5%; unlikely to explain patient's anemia.  MDS is still a possibility-if anemia worsens would recommend peripheral blood NGS.  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]  # 2019-July-aug 2019-ischemic colitis status post right  hemicolectomy [Dr.[name]]; acute renal failure;  -----------------------------------------------------------------    DIAGNOSIS: RML Lung ca adeno [stage I] #squamous cell carcinoma esophagus -stage I   CURRENT/MOST RECENT THERAPY: Status post radiation       Breast cancer in female [facility])  07/24/1999 Initial Diagnosis   Breast cancer in female ([place]) T3 N1 M0 tumor ER/PR positive   08/24/1999 -  Anti-estrogen oral therapy   Started tamoxifen   08/24/1999 Surgery   Status post modified radical mastectomy of left breast    Chemotherapy      Radiation Therapy     11/24/2003 - 02/20/2009 Anti-estrogen oral therapy   Started Aromasin   Carcinoma of overlapping sites of left breast in female, estrogen receptor positive ([place])  06/22/2016 Initial Diagnosis   Cancer of overlapping sites of left female breast ([place])   Malignant neoplasm of upper lobe of right lung ([place])  Malignant neoplasm of lower third of esophagus ([place])  05/01/2019 Initial Diagnosis   Malignant neoplasm of lower third of esophagus ([place])     INTERVAL HISTORY:  Danielle [name] 64 y.o.  female pleasant patient above history of multiple malignancies including most recent stage I squamous cell cancer of esophagus currently status post radiation; and history of severe anemia is here for follow-up.  In the interim patient was evaluated by GI; awaiting endoscopy next month.  Otherwise appetite is good.  No weight loss.  Has not had any hospitalizations.  No nausea no vomiting.  Denies any difficulty swallowing.   Review of Systems  Constitutional: Positive for malaise/fatigue and weight loss. Negative for chills, diaphoresis and fever.  HENT: Negative for nosebleeds and sore throat.   Eyes: Negative for double vision.  Respiratory: Positive for cough and  shortness of breath. Negative for hemoptysis, sputum production and wheezing.   Cardiovascular: Negative for chest pain, palpitations, orthopnea and leg swelling.   Gastrointestinal: Negative for abdominal pain, blood in stool, constipation, heartburn, melena and vomiting.  Genitourinary: Negative for dysuria, frequency and urgency.  Musculoskeletal: Negative for back pain and joint pain.  Skin: Negative.  Negative for itching and rash.  Neurological: Negative for tingling, focal weakness, weakness and headaches.  Endo/Heme/Allergies: Does not bruise/bleed easily.  Psychiatric/Behavioral: Negative for depression. The patient is not nervous/anxious and does not have insomnia.       PAST MEDICAL HISTORY :  Past Medical History:  Diagnosis Date  . Anxiety   . Arthritis   . Breast cancer ([place]) 2000  . Cancer ([place]) left    breast cancer 2000, chemo tx's with total mastectomy and lymph nodes resected.   . Cancer of right lung ([place]) 02/21/2016   rad tx's.   . CHF (congestive heart failure) ([place])   . COPD (chronic obstructive pulmonary disease) ([place])   . Dependence on supplemental oxygen   . Depression   . Heart murmur   . Hypertension   . Lung nodule   . Lymphedema   . Personal history of chemotherapy   . Personal history of radiation therapy   . Shortness of breath dyspnea    with exertion  . Status post chemotherapy 2001   left breast cancer  . Status post radiation therapy 2001   left breast cancer    PAST SURGICAL HISTORY :   Past Surgical History:  Procedure Laterality Date  . Breast Biospy Left    [facility]  . BREAST SURGERY    . COLONOSCOPY N/A 04/30/2018   Procedure: COLONOSCOPY;  Surgeon: Virgel Manifold, MD;  Location: [facility] ENDOSCOPY;  Service: Endoscopy;  Laterality: N/A;  . COLONOSCOPY N/A 07/22/2018   Procedure: COLONOSCOPY;  Surgeon: Virgel Manifold, MD;  Location: [facility] ENDOSCOPY;  Service: Endoscopy;  Laterality: N/A;  . DILATION AND CURETTAGE OF UTERUS    . ELECTROMAGNETIC NAVIGATION BROCHOSCOPY Right 04/11/2016   Procedure: ELECTROMAGNETIC NAVIGATION BRONCHOSCOPY;  Surgeon: Vilinda Boehringer, MD;  Location: [facility] ORS;   Service: Cardiopulmonary;  Laterality: Right;  . ESOPHAGOGASTRODUODENOSCOPY N/A 07/22/2018   Procedure: ESOPHAGOGASTRODUODENOSCOPY (EGD);  Surgeon: Virgel Manifold, MD;  Location: [facility] ENDOSCOPY;  Service: Endoscopy;  Laterality: N/A;  . ESOPHAGOGASTRODUODENOSCOPY (EGD) WITH PROPOFOL N/A 05/07/2018   Procedure: ESOPHAGOGASTRODUODENOSCOPY (EGD) WITH PROPOFOL;  Surgeon: Lucilla Lame, MD;  Location[facility] END[facility]COPY;  Service: Endoscopy;  Laterality: N/A;  . ESOPHAGOGASTRODUODENOSCOPY (EGD) WITH PROPOFOL N/A 04/24/2019   Procedure: ESOPHAGOGASTRODUODENOSCOPY (EGD) WITH PROPOFOL;  Surgeon: Jonathon Bellows, MD;  Location: [facility] ENDOSCOPY;  Service: Gastroenterology;  Laterality: N/A;  . ESOPHAGOGASTRODUODENOSCOPY (EGD) WITH PROPOFOL N/A 01/12/2020   Procedure: ESOPHAGOGASTRODUODENOSCOPY (EGD) WITH PROPOFOL;  Surgeon: Jonathon Bellows, MD;  Location: [facility] ENDOSCOPY;  Service: Gastroenterology;  Laterality: N/A;  . EUS N/A 05/07/2019   Procedure: FULL UPPER ENDOSCOPIC ULTRASOUND (EUS) RADIAL;  Surgeon: Jola Schmidt, MD;  Location: [facility] ENDOSCOPY;  Service: Endoscopy;  Laterality: N/A;  . history of colonoscopy]    . ILEOSCOPY N/A 07/22/2018   Procedure: ILEOSCOPY THROUGH STOMA;  Surgeon: Virgel Manifold, MD;  Location: [facility] ENDOSCOPY;  Service: Endoscopy;  Laterality: N/A;  . ILEOSTOMY    . ILEOSTOMY N/A 09/08/2018   Procedure: ILEOSTOMY REVISION POSSIBLE CREATION;  Surgeon: Herbert Pun, MD;  Location: [facility] ORS;  Service: General;  Laterality: N/A;  . ILEOSTOMY CLOSURE N/A 08/15/2018   Procedure: DILATION OF ILEOSTOMY STRICTURE;  Surgeon: Windell Moment,  Reeves Forth, MD;  Location: [facility] ORS;  Service: General;  Laterality: N/A;  . LAPAROTOMY Right 05/04/2018   Procedure: EXPLORATORY LAPAROTOMY right colectomy right and left ostomy;  Surgeon: Herbert Pun, MD;  Location: [facility] ORS;  Service: General;  Laterality: Right;  . LUNG BIOPSY    . MASTECTOMY Left    2000, [facility]  . ROTATOR CUFF REPAIR Right     [facility]    FAMILY HISTORY :   Family History  Problem Relation Age of Onset  . Breast cancer Mother 78  . Cancer Mother        Breast   . Cirrhosis Father   . Breast cancer Paternal Aunt 69  . Cancer Maternal Aunt        Breast     SOCIAL HISTORY:   Social History   Tobacco Use  . Smoking status: Former Smoker    Packs/day: 0.50    Years: 20.00    Pack years: 10.00    Types: Cigarettes    Quit date: 07/02/2012    Years since quitting: 7.5  . Smokeless tobacco: Current User    Types: Snuff  . Tobacco comment: quit 2014  Substance Use Topics  . Alcohol use: Not Currently    Comment: Occasionally  . Drug use: No    ALLERGIES:  has No Known Allergies.  MEDICATIONS:  Current Outpatient Medications  Medication Sig Dispense Refill  . acetaminophen (TYLENOL) 325 MG tablet Take 2 tablets (650 mg total) by mouth every 6 (six) hours as needed for mild pain (or Fever >/= 101).    [name] albuterol (PROVENTIL HFA;VENTOLIN HFA) 108 (90 Base) MCG/ACT inhaler Inhale 2 puffs into the lungs every 6 (six) hours as needed for wheezing or shortness of breath. 1 Inhaler 2  . calcium carbonate (TUMS - DOSED IN MG ELEMENTAL CALCIUM) 500 MG chewable tablet Chew 0.5 tablets (100 mg of elemental calcium total) by mouth daily.    . calcium-vitamin D (OSCAL WITH D) 250-125 MG-UNIT tablet Take 1 tablet by mouth daily.    . citalopram (CELEXA) 20 MG tablet Take 40 mg by mouth daily.     . ferrous sulfate 325 (65 FE) MG tablet Take 1 tablet (325 mg total) by mouth 2 (two) times daily with a meal. 60 tablet 3  . Fluticasone-Umeclidin-Vilant 100-62.5-25 MCG/INH AEPB Inhale 1 puff into the lungs daily.     [name] guaiFENesin-dextromethorphan (ROBITUSSIN DM) 100-10 MG/5ML syrup Take 5 mLs by mouth every 4 (four) hours as needed for cough.    . loperamide (IMODIUM A-D) 2 MG tablet Take 1 tablet (2 mg total) by mouth as needed for diarrhea or loose stools. 30 tablet 0  . Multiple Vitamin (MULTIVITAMIN WITH MINERALS)  TABS tablet Take 1 tablet by mouth daily. 30 tablet 1  . Nutritional Supplements (FEEDING SUPPLEMENT, NEPRO CARB STEADY,) LIQD Take 237 mLs by mouth 3 (three) times daily between meals. 21330 mL 0  . pantoprazole (PROTONIX) 40 MG tablet Take 1 tablet (40 mg total) by mouth 2 (two) times daily before a meal. 60 tablet 0  . sodium bicarbonate 650 MG tablet Take 2 tablets (1,300 mg total) by mouth 3 (three) times daily. 180 tablet 0  . sucralfate (CARAFATE) 1 GM/10ML suspension Take 10 mLs (1 g total) by mouth 4 (four) times daily. 1200 mL 2  . torsemide (DEMADEX) 20 MG tablet Take 1 tablet by mouth as needed.    . carvedilol (COREG) 6.25 MG tablet Take 1 tablet (6.25 mg total) by mouth  2 (two) times daily with a meal. 60 tablet 0   No current facility-administered medications for this visit.    PHYSICAL EXAMINATION: ECOG PERFORMANCE STATUS: 1 - Symptomatic but completely ambulatory  BP (!) 150/83 (BP Location: Left Arm, Patient Position: Sitting, Cuff Size: Normal)   Pulse 85   Temp (!) 95.3 F (35.2 C) (Tympanic)   Wt 124 lb (56.2 kg)   BMI 20.01 kg/m   Filed Weights   01/07/20 1037  Weight: 124 lb (56.2 kg)    Physical Exam  Constitutional: She is oriented to person, place, and time.  Thin built moderately nourished female patient.  She is walking herself.  She is alone.  HENT:  Head: Normocephalic and atraumatic.  Mouth/Throat: Oropharynx is clear and moist. No oropharyngeal exudate.  Eyes: Pupils are equal, round, and reactive to light.  Cardiovascular: Normal rate and regular rhythm.  Pulmonary/Chest: No respiratory distress. She has no wheezes.  Decreased breath sounds bilaterally.  No wheeze or crackles.  Abdominal: Soft. Bowel sounds are normal. She exhibits no distension and no mass. There is no abdominal tenderness. There is no rebound and no guarding.  Musculoskeletal:        General: No tenderness or edema. Normal range of motion.     Cervical back: Normal range of  motion and neck supple.  Neurological: She is alert and oriented to person, place, and time.  Skin: Skin is warm.  Psychiatric: Affect normal.       LABORATORY DATA:  I have reviewed the data as listed    Component Value Date/Time   NA 139 01/07/2020 1005   NA 132 (L) 02/09/2015 1100   K 5.0 01/07/2020 1005   K 3.8 02/09/2015 1100   CL 109 01/07/2020 1005   CL 95 (L) 02/09/2015 1100   CO2 21 (L) 01/07/2020 1005   CO2 29 02/09/2015 1100   GLUCOSE 85 01/07/2020 1005   GLUCOSE 105 (H) 02/09/2015 1100   BUN 42 (H) 01/07/2020 1005   BUN 16 02/09/2015 1100   CREATININE 2.14 (H) 01/07/2020 1005   CREATININE 0.81 02/09/2015 1100   CALCIUM 9.7 01/07/2020 1005   CALCIUM 9.1 02/09/2015 1100   PROT 9.5 (H) 10/29/2019 0906   PROT 7.7 02/09/2015 1100   ALBUMIN 4.5 10/29/2019 0906   ALBUMIN 4.3 02/09/2015 1100   AST 22 10/29/2019 0906   AST 29 02/09/2015 1100   ALT 17 10/29/2019 0906   ALT 20 02/09/2015 1100   ALKPHOS 66 10/29/2019 0906   ALKPHOS 69 02/09/2015 1100   BILITOT 0.6 10/29/2019 0906   BILITOT 0.9 02/09/2015 1100   GFRNONAA 24 (L) 01/07/2020 1005   GFRNONAA >60 02/09/2015 1100   GFRAA 27 (L) 01/07/2020 1005   GFRAA >60 02/09/2015 1100    No results found for: SPEP, UPEP  Lab Results  Component Value Date   WBC 6.6 01/07/2020   NEUTROABS 3.9 01/07/2020   HGB 10.1 (L) 01/07/2020   HCT 30.7 (L) 01/07/2020   MCV 93.0 01/07/2020   PLT 264 01/07/2020      Chemistry      Component Value Date/Time   NA 139 01/07/2020 1005   NA 132 (L) 02/09/2015 1100   K 5.0 01/07/2020 1005   K 3.8 02/09/2015 1100   CL 109 01/07/2020 1005   CL 95 (L) 02/09/2015 1100   CO2 21 (L) 01/07/2020 1005   CO2 29 02/09/2015 1100   BUN 42 (H) 01/07/2020 1005   BUN 16 02/09/2015 1100  CREATININE 2.14 (H) 01/07/2020 1005   CREATININE 0.81 02/09/2015 1100      Component Value Date/Time   CALCIUM 9.7 01/07/2020 1005   CALCIUM 9.1 02/09/2015 1100   ALKPHOS 66 10/29/2019 0906    ALKPHOS 69 02/09/2015 1100   AST 22 10/29/2019 0906   AST 29 02/09/2015 1100   ALT 17 10/29/2019 0906   ALT 20 02/09/2015 1100   BILITOT 0.6 10/29/2019 0906   BILITOT 0.9 02/09/2015 1100       RADIOGRAPHIC STUDIES: I have personally reviewed the radiological images as listed and agreed with the findings in the report. No results found.   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  Currently s/p definitive radiation [dec 7th 2020].  STABLE.  Posttreatment PET scan February 2021-no evidence of any metastatic disease.  No uptake in the esophagus region.  Discussed with [name]- awaiting EGD on 3/20.   #Severe anemia-likely secondary to CKD stage IV-currently on Retacrit; hemoglobin today is 10.3 HOLD Retacrit today.  #CKD stage IV-clinically STABLE.   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  STABLE.   # DISPOSITION: # HOLD retacrit today # in 4 weeks- H&H- possible retacrit # follow up in 8 weeks- cbc/bmp; possible Retacrit;-Dr.B     Orders Placed This Encounter  Procedures  . Hematocrit ([facility])    Standing Status:   Future    Standing Expiration Date:   01/06/2021  . Hemoglobin [facility])    Standing Status:   Future    Standing Expiration Date:   01/06/2021  . CBC with Differential    Standing Status:   Future    Standing Expiration Date:   01/06/2021  . Basic metabolic panel    Standing Status:   Future    Standing Expiration Date:   01/06/2021   All questions were answered. The patient knows to call the clinic with any problems, questions or concerns.      [name], MD 01/14/2020 7:50 AM

## 2020-01-07 NOTE — Assessment & Plan Note (Addendum)
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  Currently s/p definitive radiation [dec 7th 2020].  STABLE.  Posttreatment PET scan February 2021-no evidence of any metastatic disease.  No uptake in the esophagus region.  Discussed with [name]- awaiting EGD on 3/20.   #Severe anemia-likely secondary to CKD stage IV-currently on Retacrit; hemoglobin today is 10.3 HOLD Retacrit today.  #CKD stage IV-clinically STABLE.   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  STABLE.   # DISPOSITION: # HOLD retacrit today # in 4 weeks- H&H- possible retacrit # follow up in 8 weeks- cbc/bmp; possible Retacrit;-Dr.B

## 2020-01-08 ENCOUNTER — Other Ambulatory Visit
Admission: RE | Admit: 2020-01-08 | Discharge: 2020-01-08 | Disposition: A | Discharge status: Home / Self Care | Payer: Medicare Other | Source: Ambulatory Visit | Attending: Gastroenterology | Admitting: Gastroenterology

## 2020-01-08 DIAGNOSIS — Z01812 Encounter for preprocedural laboratory examination: Secondary | ICD-10-CM | POA: Insufficient documentation

## 2020-01-08 DIAGNOSIS — Z20822 Contact with and (suspected) exposure to covid-19: Secondary | ICD-10-CM | POA: Diagnosis not present

## 2020-01-08 LAB — SARS CORONAVIRUS 2 (TAT 6-24 HRS): SARS Coronavirus 2: NEGATIVE

## 2020-01-11 ENCOUNTER — Encounter: Payer: Self-pay | Admitting: Gastroenterology

## 2020-01-12 ENCOUNTER — Other Ambulatory Visit: Payer: Self-pay

## 2020-01-12 ENCOUNTER — Encounter
Admission: RE | Disposition: A | Discharge status: Home / Self Care | Payer: Self-pay | Source: Home / Self Care | Attending: Gastroenterology

## 2020-01-12 ENCOUNTER — Ambulatory Visit
Admission: RE | Admit: 2020-01-12 | Discharge: 2020-01-12 | Disposition: A | Discharge status: Home / Self Care | Payer: Medicare Other | Attending: Gastroenterology | Admitting: Gastroenterology

## 2020-01-12 ENCOUNTER — Encounter: Payer: Self-pay | Admitting: Gastroenterology

## 2020-01-12 ENCOUNTER — Ambulatory Visit: Payer: Medicare Other | Admitting: Anesthesiology

## 2020-01-12 DIAGNOSIS — D649 Anemia, unspecified: Secondary | ICD-10-CM | POA: Diagnosis not present

## 2020-01-12 DIAGNOSIS — I110 Hypertensive heart disease with heart failure: Secondary | ICD-10-CM | POA: Diagnosis not present

## 2020-01-12 DIAGNOSIS — Z803 Family history of malignant neoplasm of breast: Secondary | ICD-10-CM | POA: Insufficient documentation

## 2020-01-12 DIAGNOSIS — Z9221 Personal history of antineoplastic chemotherapy: Secondary | ICD-10-CM | POA: Insufficient documentation

## 2020-01-12 DIAGNOSIS — M1990 Unspecified osteoarthritis, unspecified site: Secondary | ICD-10-CM | POA: Diagnosis not present

## 2020-01-12 DIAGNOSIS — K228 Other specified diseases of esophagus: Secondary | ICD-10-CM | POA: Diagnosis not present

## 2020-01-12 DIAGNOSIS — Z85118 Personal history of other malignant neoplasm of bronchus and lung: Secondary | ICD-10-CM | POA: Insufficient documentation

## 2020-01-12 DIAGNOSIS — Z853 Personal history of malignant neoplasm of breast: Secondary | ICD-10-CM | POA: Insufficient documentation

## 2020-01-12 DIAGNOSIS — C155 Malignant neoplasm of lower third of esophagus: Secondary | ICD-10-CM | POA: Diagnosis not present

## 2020-01-12 DIAGNOSIS — R011 Cardiac murmur, unspecified: Secondary | ICD-10-CM | POA: Insufficient documentation

## 2020-01-12 DIAGNOSIS — F329 Major depressive disorder, single episode, unspecified: Secondary | ICD-10-CM | POA: Diagnosis not present

## 2020-01-12 DIAGNOSIS — R0602 Shortness of breath: Secondary | ICD-10-CM | POA: Insufficient documentation

## 2020-01-12 DIAGNOSIS — Z87891 Personal history of nicotine dependence: Secondary | ICD-10-CM | POA: Insufficient documentation

## 2020-01-12 DIAGNOSIS — Z9981 Dependence on supplemental oxygen: Secondary | ICD-10-CM | POA: Insufficient documentation

## 2020-01-12 DIAGNOSIS — F419 Anxiety disorder, unspecified: Secondary | ICD-10-CM | POA: Insufficient documentation

## 2020-01-12 DIAGNOSIS — Z923 Personal history of irradiation: Secondary | ICD-10-CM | POA: Insufficient documentation

## 2020-01-12 DIAGNOSIS — I509 Heart failure, unspecified: Secondary | ICD-10-CM | POA: Insufficient documentation

## 2020-01-12 DIAGNOSIS — Z79899 Other long term (current) drug therapy: Secondary | ICD-10-CM | POA: Insufficient documentation

## 2020-01-12 DIAGNOSIS — Z809 Family history of malignant neoplasm, unspecified: Secondary | ICD-10-CM | POA: Diagnosis not present

## 2020-01-12 DIAGNOSIS — J449 Chronic obstructive pulmonary disease, unspecified: Secondary | ICD-10-CM | POA: Diagnosis not present

## 2020-01-12 DIAGNOSIS — Z8379 Family history of other diseases of the digestive system: Secondary | ICD-10-CM | POA: Diagnosis not present

## 2020-01-12 DIAGNOSIS — I252 Old myocardial infarction: Secondary | ICD-10-CM | POA: Diagnosis not present

## 2020-01-12 HISTORY — PX: ESOPHAGOGASTRODUODENOSCOPY (EGD) WITH PROPOFOL: SHX5813

## 2020-01-12 SURGERY — ESOPHAGOGASTRODUODENOSCOPY (EGD) WITH PROPOFOL
Anesthesia: General

## 2020-01-12 MED ORDER — PROPOFOL 500 MG/50ML IV EMUL
INTRAVENOUS | Status: AC
  Filled 2020-01-12: qty 50

## 2020-01-12 MED ORDER — PROPOFOL 500 MG/50ML IV EMUL
INTRAVENOUS | Status: DC | PRN
  Administered 2020-01-12: 150 ug/kg/min via INTRAVENOUS

## 2020-01-12 MED ORDER — SODIUM CHLORIDE 0.9 % IV SOLN
INTRAVENOUS | Status: DC
  Administered 2020-01-12: 1000 mL via INTRAVENOUS

## 2020-01-12 MED ORDER — LIDOCAINE HCL (PF) 1 % IJ SOLN
INTRAMUSCULAR | Status: AC
  Filled 2020-01-12: qty 2

## 2020-01-12 MED ORDER — LIDOCAINE HCL (CARDIAC) PF 100 MG/5ML IV SOSY
PREFILLED_SYRINGE | INTRAVENOUS | Status: DC | PRN
  Administered 2020-01-12: 50 mg via INTRAVENOUS

## 2020-01-12 MED ORDER — PROPOFOL 10 MG/ML IV BOLUS
INTRAVENOUS | Status: DC | PRN
  Administered 2020-01-12: 50 mg via INTRAVENOUS
  Administered 2020-01-12: 20 mg via INTRAVENOUS

## 2020-01-12 NOTE — Transfer of Care (Signed)
Immediate Anesthesia Transfer of Care Note  Patient: Danielle [name]  Procedure(s) Performed: ESOPHAGOGASTRODUODENOSCOPY (EGD) WITH PROPOFOL (N/A )  Patient Location: PACU  Anesthesia Type:General  Level of Consciousness: awake, alert  and drowsy  Airway & Oxygen Therapy: Patient Spontanous Breathing  Post-op Assessment: Report given to RN and Post -op Vital signs reviewed and stable  Post vital signs: Reviewed and stable  Last Vitals:  Vitals Value Taken Time  BP 125/83 01/12/20 0915  Temp    Pulse 100 01/12/20 0915  Resp 24 01/12/20 0915  SpO2 96 % 01/12/20 0915  Vitals shown include unvalidated device data.  Last Pain:  Vitals:   01/12/20 0914  TempSrc: Temporal  PainSc: 0-No pain         Complications: No apparent anesthesia complications

## 2020-01-12 NOTE — Op Note (Signed)
[facility] Gastroenterology Patient Name: Danielle [name] Procedure Date: 01/12/2020 8:55 AM MRN: [id number] Account #: [id number] Date of Birth: 12/24/1955 Admit Type: Outpatient Age: 64 Room: [facility] ENDO ROOM 1 Gender: Female Note Status: Finalized Procedure:             Upper GI endoscopy Indications:           Follow-up of malignant esophageal tumor Providers:             Jonathon Bellows MD, MD Referring MD:          No Local Md, MD (Referring MD) Medicines:             Monitored Anesthesia Care Complications:         No immediate complications. Procedure:             Pre-Anesthesia Assessment:                        - Prior to the procedure, a History and Physical was                         performed, and patient medications, allergies and                         sensitivities were reviewed. The patient's tolerance                         of previous anesthesia was reviewed.                        - The risks and benefits of the procedure and the                         sedation options and risks were discussed with the                         patient. All questions were answered and informed                         consent was obtained.                        - ASA Grade Assessment: III - A patient with severe                         systemic disease.                        After obtaining informed consent, the endoscope was                         passed under direct vision. Throughout the procedure,                         the patient's blood pressure, pulse, and oxygen                         saturations were monitored continuously. The Endoscope                         was introduced through  the mouth, and advanced to the                         third part of duodenum. The upper GI endoscopy was                         accomplished with ease. The patient tolerated the                         procedure well. Findings:      The examined duodenum was normal.    The stomach was normal.      The cardia and gastric fundus were normal on retroflexion.      A single 6 mm mucosal nodule with a localized distribution was found in       the lower third of the esophagus, 42 cm from the incisors. The polyp was       removed with a cold biopsy forceps. Resection and retrieval were       complete. To prevent bleeding after the polypectomy, one hemostatic clip       was successfully placed. There was no bleeding during, or at the end, of       the procedure. Impression:            - Normal examined duodenum.                        - Normal stomach.                        - Mucosal nodule found in the esophagus. Clip was                         placed. Recommendation:        - Discharge patient to home (with escort).                        - Resume previous diet.                        - Continue present medications.                        - Await pathology results.                        - Repeat upper endoscopy in 6 months for surveillance. Procedure Code(s):     --- Professional ---                        [phone], Esophagogastroduodenoscopy, flexible,                         transoral; with biopsy, single or multiple Diagnosis Code(s):     --- Professional ---                        K22.8, Other specified diseases of esophagus                        C15.9, Malignant neoplasm of esophagus, unspecified CPT copyright 2019 American Medical Association. All rights reserved. The codes documented in this report are  preliminary and upon coder review may  be revised to meet current compliance requirements. Jonathon Bellows, MD Jonathon Bellows MD, MD 01/12/2020 9:09:48 AM This report has been signed electronically. Number of Addenda: 0 Note Initiated On: 01/12/2020 8:55 AM Estimated Blood Loss:  Estimated blood loss: none.      [facility]

## 2020-01-12 NOTE — Anesthesia Preprocedure Evaluation (Addendum)
Anesthesia Evaluation  Patient identified by MRN, date of birth, ID band Patient awake    Reviewed: Allergy & Precautions, H&P , NPO status , Patient's Chart, lab work & pertinent test results  Airway Mallampati: II  TM Distance: >3 FB     Dental  (+) Missing, Edentulous Upper   Pulmonary shortness of breath, COPD,  COPD inhaler and oxygen dependent, former smoker,    breath sounds clear to auscultation       Cardiovascular hypertension, (-) angina+CHF   Rhythm:regular Rate:Normal  1. EF 50 to 55%. Mild LVH. 2. Grade I diastolic dysfunction (impaired relaxation).  3. Small to moderate inferior myocardial infarction.  4. The left ventricle demonstrates regional wall motion abnormalities.  5. Normal RV 6. Moderately elevated pulmonary artery systolic pressure.    Neuro/Psych PSYCHIATRIC DISORDERS Anxiety Depression negative neurological ROS     GI/Hepatic Neg liver ROS, PUD, GERD  ,  Endo/Other  negative endocrine ROS  Renal/GU CRFRenal disease  negative genitourinary   Musculoskeletal   Abdominal   Peds  Hematology  (+) Blood dyscrasia, anemia ,   Anesthesia Other Findings Past Medical History: No date: Anxiety No date: Arthritis 2000: Breast cancer ([place]) left : Cancer ([place])     Comment:  breast cancer 2000, chemo tx's with total mastectomy and              lymph nodes resected.  02/21/2016: Cancer of right lung ([place])     Comment:  rad tx's.  No date: CHF (congestive heart failure) (HCC) No date: COPD (chronic obstructive pulmonary disease) (HCC) No date: Dependence on supplemental oxygen No date: Depression No date: Heart murmur No date: Hypertension No date: Lung nodule No date: Lymphedema No date: Personal history of chemotherapy No date: Personal history of radiation therapy No date: Shortness of breath dyspnea     Comment:  with exertion 2001: Status post chemotherapy     Comment:  left breast  cancer 2001: Status post radiation therapy     Comment:  left breast cancer  Past Surgical History: No date: Breast Biospy; Left     Comment:  [facility] No date: BREAST SURGERY 04/30/2018: COLONOSCOPY; N/A     Comment:  Procedure: COLONOSCOPY;  Surgeon: Virgel Manifold,               MD;  Location: [facility] ENDOSCOPY;  Service: Endoscopy;                Laterality: N/A; 07/22/2018: COLONOSCOPY; N/A     Comment:  Procedure: COLONOSCOPY;  Surgeon: Virgel Manifold,               MD;  Location: [facility] ENDOSCOPY;  Service: Endoscopy;                Laterality: N/A; No date: DILATION AND CURETTAGE OF UTERUS 04/11/2016: ELECTROMAGNETIC NAVIGATION BROCHOSCOPY; Right     Comment:  Procedure: ELECTROMAGNETIC NAVIGATION BRONCHOSCOPY;                Surgeon: Vilinda Boehringer, MD;  Location: [facility] ORS;                Service: Cardiopulmonary;  Laterality: Right; 07/22/2018: ESOPHAGOGASTRODUODENOSCOPY; N/A     Comment:  Procedure: ESOPHAGOGASTRODUODENOSCOPY (EGD);  Surgeon:               Virgel Manifold, MD;  Location: [facility] ENDOSCOPY;                Service: Endoscopy;  Laterality: N/A;  05/07/2018: ESOPHAGOGASTRODUODENOSCOPY (EGD) WITH PROPOFOL; N/A     Comment:  Procedure: ESOPHAGOGASTRODUODENOSCOPY (EGD) WITH               PROPOFOL;  Surgeon: Lucilla Lame, MD;  Location: [facility]               ENDOSCOPY;  Service: Endoscopy;  Laterality: N/A; 04/24/2019: ESOPHAGOGASTRODUODENOSCOPY (EGD) WITH PROPOFOL; N/A     Comment:  Procedure: ESOPHAGOGASTRODUODENOSCOPY (EGD) WITH               PROPOFOL;  Surgeon: Jonathon Bellows, MD;  Location: [facility]               ENDOSCOPY;  Service: Gastroenterology;  Laterality: N/A; 05/07/2019: EUS; N/A     Comment:  Procedure: FULL UPPER ENDOSCOPIC ULTRASOUND (EUS)               RADIAL;  Surgeon: Jola Schmidt, MD;  Location: [facility]               ENDOSCOPY;  Service: Endoscopy;  Laterality: N/A; No date: history of colonoscopy] 07/22/2018: ILEOSCOPY; N/A     Comment:  Procedure:  ILEOSCOPY THROUGH STOMA;  Surgeon: Virgel Manifold, MD;  Location: [facility] ENDOSCOPY;  Service:               Endoscopy;  Laterality: N/A; No date: ILEOSTOMY 09/08/2018: ILEOSTOMY; N/A     Comment:  Procedure: ILEOSTOMY REVISION POSSIBLE CREATION;                Surgeon: Herbert Pun, MD;  Location: [facility] ORS;               Service: General;  Laterality: N/A; 08/15/2018: ILEOSTOMY CLOSURE; N/A     Comment:  Procedure: DILATION OF ILEOSTOMY STRICTURE;  Surgeon:               Herbert Pun, MD;  Location: [facility] ORS;  Service:              General;  Laterality: N/A; 05/04/2018: LAPAROTOMY; Right     Comment:  Procedure: EXPLORATORY LAPAROTOMY right colectomy right               and left ostomy;  Surgeon: Herbert Pun, MD;                Location: [facility] ORS;  Service: General;  Laterality:               Right; No date: LUNG BIOPSY No date: MASTECTOMY; Left     Comment:  2000, [facility] No date: ROTATOR CUFF REPAIR; Right     Comment:  [facility]     Reproductive/Obstetrics negative OB ROS                            Anesthesia Physical Anesthesia Plan  ASA: III  Anesthesia Plan: General   Post-op Pain Management:    Induction:   PONV Risk Score and Plan: Propofol infusion and TIVA  Airway Management Planned: Natural Airway and Nasal Cannula  Additional Equipment:   Intra-op Plan:   Post-operative Plan:   Informed Consent: I have reviewed the patients History and Physical, chart, labs and discussed the procedure including the risks, benefits and alternatives for the proposed anesthesia with the patient or authorized representative who has indicated his/her understanding and acceptance.     Dental Advisory Given  Plan Discussed with: Anesthesiologist  Anesthesia Plan Comments:         Anesthesia Quick Evaluation

## 2020-01-12 NOTE — Anesthesia Postprocedure Evaluation (Signed)
Anesthesia Post Note  Patient: Danielle [name]  Procedure(s) Performed: ESOPHAGOGASTRODUODENOSCOPY (EGD) WITH PROPOFOL (N/A )  Patient location during evaluation: PACU Anesthesia Type: General Level of consciousness: awake and alert Pain management: pain level controlled Vital Signs Assessment: post-procedure vital signs reviewed and stable Respiratory status: spontaneous breathing, nonlabored ventilation and respiratory function stable Cardiovascular status: blood pressure returned to baseline and stable Postop Assessment: no apparent nausea or vomiting Anesthetic complications: no     Last Vitals:  Vitals:   01/12/20 0924 01/12/20 0934  BP:  116/87  Pulse:    Resp: 19   Temp:    SpO2:      Last Pain:  Vitals:   01/12/20 0934  TempSrc:   PainSc: 0-No pain                 Tera Mater

## 2020-01-12 NOTE — Anesthesia Procedure Notes (Signed)
Date/Time: 01/12/2020 8:58 AM Performed by: Johnna Acosta, CRNA Pre-anesthesia Checklist: Patient identified, Emergency Drugs available, Suction available, Patient being monitored and Timeout performed Patient Re-evaluated:Patient Re-evaluated prior to induction Oxygen Delivery Method: Nasal cannula Preoxygenation: Pre-oxygenation with 100% oxygen Induction Type: IV induction

## 2020-01-12 NOTE — H&P (Signed)
Jonathon Bellows, MD [address] [place], [place], [place], [address] [address], [place], [place], [place], [address] Phone: [phone]  Fax: [phone]  Primary Care Physician:  [facility] [place]   Pre-Procedure History & Physical: HPI:  NATILIE KRABBENHOFT is a 64 y.o. female is here for an endoscopy    Past Medical History:  Diagnosis Date  . Anxiety   . Arthritis   . Breast cancer ([place]) 2000  . Cancer ([place]) left    breast cancer 2000, chemo tx's with total mastectomy and lymph nodes resected.   . Cancer of right lung ([place]) 02/21/2016   rad tx's.   . CHF (congestive heart failure) ([place])   . COPD (chronic obstructive pulmonary disease) ([place])   . Dependence on supplemental oxygen   . Depression   . Heart murmur   . Hypertension   . Lung nodule   . Lymphedema   . Personal history of chemotherapy   . Personal history of radiation therapy   . Shortness of breath dyspnea    with exertion  . Status post chemotherapy 2001   left breast cancer  . Status post radiation therapy 2001   left breast cancer    Past Surgical History:  Procedure Laterality Date  . Breast Biospy Left    [facility]  . BREAST SURGERY    . COLONOSCOPY N/A 04/30/2018   Procedure: COLONOSCOPY;  Surgeon: Virgel Manifold, MD;  Location: [facility] ENDOSCOPY;  Service: Endoscopy;  Laterality: N/A;  . COLONOSCOPY N/A 07/22/2018   Procedure: COLONOSCOPY;  Surgeon: Virgel Manifold, MD;  Location: [facility] ENDOSCOPY;  Service: Endoscopy;  Laterality: N/A;  . DILATION AND CURETTAGE OF UTERUS    . ELECTROMAGNETIC NAVIGATION BROCHOSCOPY Right 04/11/2016   Procedure: ELECTROMAGNETIC NAVIGATION BRONCHOSCOPY;  Surgeon: Vilinda Boehringer, MD;  Location: [facility] ORS;  Service: Cardiopulmonary;  Laterality: Right;  . ESOPHAGOGASTRODUODENOSCOPY N/A 07/22/2018   Procedure: ESOPHAGOGASTRODUODENOSCOPY (EGD);  Surgeon: Virgel Manifold, MD;  Location: [facility] ENDOSCOPY;  Service: Endoscopy;  Laterality: N/A;  .  ESOPHAGOGASTRODUODENOSCOPY (EGD) WITH PROPOFOL N/A 05/07/2018   Procedure: ESOPHAGOGASTRODUODENOSCOPY (EGD) WITH PROPOFOL;  Surgeon: Lucilla Lame, MD;  Location[facility] ENDOSCOPY;  Service: Endoscopy;  Laterality: N/A;  . ESOPHAGOGASTRODUODENOSCOPY (EGD) WITH PROPOFOL N/A 04/24/2019   Procedure: ESOPHAGOGASTRODUODENOSCOPY (EGD) WITH PROPOFOL;  Surgeon: Jonathon Bellows, MD;  Location: [facility] ENDOSCOPY;  Service: Gastroenterology;  Laterality: N/A;  . EUS N/A 05/07/2019   Procedure: FULL UPPER ENDOSCOPIC ULTRASOUND (EUS) RADIAL;  Surgeon: Jola Schmidt, MD;  Location: [facility] ENDOSCOPY;  Service: Endoscopy;  Laterality: N/A;  . history of colonoscopy]    . ILEOSCOPY N/A 07/22/2018   Procedure: ILEOSCOPY THROUGH STOMA;  Surgeon: Virgel Manifold, MD;  Location: [facility] ENDOSCOPY;  Service: Endoscopy;  Laterality: N/A;  . ILEOSTOMY    . ILEOSTOMY N/A 09/08/2018   Procedure: ILEOSTOMY REVISION POSSIBLE CREATION;  Surgeon: Herbert Pun, MD;  Location: [facility] ORS;  Service: General;  Laterality: N/A;  . ILEOSTOMY CLOSURE N/A 08/15/2018   Procedure: DILATION OF ILEOSTOMY STRICTURE;  Surgeon: Herbert Pun, MD;  Location: [facility] ORS;  Service: General;  Laterality: N/A;  . LAPAROTOMY Right 05/04/2018   Procedure: EXPLORATORY LAPAROTOMY right colectomy right and left ostomy;  Surgeon: Herbert Pun, MD;  Location: [facility] ORS;  Service: General;  Laterality: Right;  . LUNG BIOPSY    . MASTECTOMY Left    2000, [facility]  . ROTATOR CUFF REPAIR Right    [facility]    Prior to Admission medications   Medication Sig Start  Date End Date Taking? Authorizing Provider  calcium carbonate (TUMS - DOSED IN MG ELEMENTAL CALCIUM) 500 MG chewable tablet Chew 0.5 tablets (100 mg of elemental calcium total) by mouth daily. 11/05/19  Yes Enzo Bi, MD  calcium-vitamin D (OSCAL WITH D) 250-125 MG-UNIT tablet Take 1 tablet by mouth daily.   Yes [provider]  citalopram (CELEXA) 20 MG tablet Take 40 mg by mouth daily.     Yes [provider]  ferrous sulfate 325 (65 FE) MG tablet Take 1 tablet (325 mg total) by mouth 2 (two) times daily with a meal. 07/02/18  Yes [name], MD  Fluticasone-Umeclidin-Vilant 100-62.5-25 MCG/INH AEPB Inhale 1 puff into the lungs daily.  01/07/19  Yes [provider]  guaiFENesin-dextromethorphan (ROBITUSSIN DM) 100-10 MG/5ML syrup Take 5 mLs by mouth every 4 (four) hours as needed for cough.   Yes [provider]  loperamide (IMODIUM A-D) 2 MG tablet Take 1 tablet (2 mg total) by mouth as needed for diarrhea or loose stools. 10/04/18  Yes Herbert Pun, MD  Multiple Vitamin (MULTIVITAMIN WITH MINERALS) TABS tablet Take 1 tablet by mouth daily. 06/06/18  Yes Fritzi Mandes, MD  Nutritional Supplements (FEEDING SUPPLEMENT, NEPRO CARB STEADY,) LIQD Take 237 mLs by mouth 3 (three) times daily between meals. 10/21/19  Yes Loletha Grayer, MD  pantoprazole (PROTONIX) 40 MG tablet Take 1 tablet (40 mg total) by mouth 2 (two) times daily before a meal. 07/23/18  Yes Sudini, Srikar, MD  sodium bicarbonate 650 MG tablet Take 2 tablets (1,300 mg total) by mouth 3 (three) times daily. 10/21/19  Yes Wieting, Richard, MD  sucralfate (CARAFATE) 1 GM/10ML suspension Take 10 mLs (1 g total) by mouth 4 (four) times daily. 08/18/19  Yes Chrystal, Eulas Post, MD  torsemide (DEMADEX) 20 MG tablet Take 1 tablet by mouth as needed. 12/02/19 12/01/20 Yes [provider]  acetaminophen (TYLENOL) 325 MG tablet Take 2 tablets (650 mg total) by mouth every 6 (six) hours as needed for mild pain (or Fever >/= 101). 12/24/18   Gouru, Illene Silver, MD  albuterol (PROVENTIL HFA;VENTOLIN HFA) 108 (90 Base) MCG/ACT inhaler Inhale 2 puffs into the lungs every 6 (six) hours as needed for wheezing or shortness of breath. 04/24/18   Demetrios Loll, MD  carvedilol (COREG) 6.25 MG tablet Take 1 tablet (6.25 mg total) by mouth 2 (two) times daily with a meal. 11/11/19 12/11/19  Sidney Ace, MD     Allergies as of 12/23/2019  . (No Known Allergies)    Family History  Problem Relation Age of Onset  . Breast cancer Mother 65  . Cancer Mother        Breast   . Cirrhosis Father   . Breast cancer Paternal Aunt 50  . Cancer Maternal Aunt        Breast     Social History   Socioeconomic History  . Marital status: Divorced    Spouse name: Not on file  . Number of children: 3  . Years of education: Not on file  . Highest education level: Not on file  Occupational History  . Occupation: Retired   Tobacco Use  . Smoking status: Former Smoker    Packs/day: 0.50    Years: 20.00    Pack years: 10.00    Types: Cigarettes    Quit date: 07/02/2012    Years since quitting: 7.5  . Smokeless tobacco: Current User    Types: Snuff  . Tobacco comment: quit 2014  Substance and  Sexual Activity  . Alcohol use: Not Currently    Comment: Occasionally  . Drug use: No  . Sexual activity: Not Currently  Other Topics Concern  . Not on file  Social History Narrative  . Not on file   Social Determinants of Health   Financial Resource Strain:   . Difficulty of Paying Living Expenses:   Food Insecurity:   . Worried About Charity fundraiser in the Last Year:   . Arboriculturist in the Last Year:   Transportation Needs:   . Film/video editor (Medical):   [name] Lack of Transportation (Non-Medical):   Physical Activity:   . Days of Exercise per Week:   . Minutes of Exercise per Session:   Stress:   . Feeling of Stress :   Social Connections:   . Frequency of Communication with Friends and Family:   . Frequency of Social Gatherings with Friends and Family:   . Attends Religious Services:   . Active Member of Clubs or Organizations:   . Attends Archivist Meetings:   [name] Marital Status:   Intimate Partner Violence:   . Fear of Current or Ex-Partner:   . Emotionally Abused:   [name] Physically Abused:   . Sexually Abused:     Review of Systems: See HPI, otherwise  negative ROS  Physical Exam: BP 120/85   Pulse 100   Temp 97.6 F (36.4 C) (Temporal)   Resp 18   Ht 5\' 6"  (1.676 m)   Wt 56.4 kg   SpO2 100%   BMI 20.05 kg/m  General:   Alert,  pleasant and cooperative in NAD Head:  Normocephalic and atraumatic. Neck:  Supple; no masses or thyromegaly. Lungs:  Clear throughout to auscultation, normal respiratory effort.    Heart:  +S1, +S2, Regular rate and rhythm, No edema. Abdomen:  Soft, nontender and nondistended. Normal bowel sounds, without guarding, and without rebound.   Neurologic:  Alert and  oriented x4;  grossly normal neurologically.  Impression/Plan: NAZIYA HEGWOOD is here for an endoscopy  to be performed for  evaluation of esophageal cancer    Risks, benefits, limitations, and alternatives regarding endoscopy have been reviewed with the patient.  Questions have been answered.  All parties agreeable.   Jonathon Bellows, MD  01/12/2020, 8:46 AM

## 2020-01-13 ENCOUNTER — Encounter: Payer: Self-pay | Admitting: *Deleted

## 2020-01-14 LAB — SURGICAL PATHOLOGY

## 2020-01-14 NOTE — Progress Notes (Signed)
Good afternoon Dr. Rogue Bussing.Endoscopically there was a very tiny area like a nodule which appeared abnormal.  Significantly improved since since the last endoscopy when we initially diagnosed her with the cancer.  I took biopsies to the extent that the abnormal tissue was completely resected on visualization.  Based on the pathology there is no clear evidence of persistence of cancer but obviously cannot be completely excluded.  I probably think the best way forward would be to repeat endoscopy in about 3 months time do let me know your thoughts.  Danielle [name]: Can we put her on the schedule for an upper endoscopy in about 3 months time.  Can you also inform the patient that the biopsy showed some abnormal cells but not clearly showing any cancer.  Danielle [name]

## 2020-01-19 ENCOUNTER — Telehealth: Payer: Self-pay

## 2020-01-19 NOTE — Telephone Encounter (Signed)
-----   Message from Jonathon Bellows, MD sent at 01/14/2020  3:03 PM EDT ----- Good afternoon Dr. Rogue Bussing.Endoscopically there was a very tiny area like a nodule which appeared abnormal.  Significantly improved since since the last endoscopy when we initially diagnosed her with the cancer.  I took biopsies to the extent tha t the abnormal tissue was completely resected on visualization.  Based on the pathology there is no clear evidence of persistence of cancer but obviously cannot be completely excluded.  I probably think the best way forward would be to repeat endosco py in about 3 months time do let me know your thoughts.  Aneudy Champlain: Can we put her on the schedule for an upper endoscopy in about 3 months time.  Can you also inform the patient that the biopsy showed some abnormal cells but not clearly showing any cancer.  Bailey Mech

## 2020-01-19 NOTE — Telephone Encounter (Signed)
Spoke with Danielle [name] and informed her of biopsy results and Dr. Georgeann Oppenheim recommendation. Danielle [name] understands and agrees. Danielle [name] is aware that she'll receive a recall letter via mail when she's due for the upper endoscopy.

## 2020-01-20 ENCOUNTER — Emergency Department: Payer: Medicare Other

## 2020-01-20 ENCOUNTER — Other Ambulatory Visit: Payer: Self-pay

## 2020-01-20 ENCOUNTER — Observation Stay
Admission: EM | Admit: 2020-01-20 | Discharge: 2020-01-21 | Disposition: A | Discharge status: Home / Self Care | Payer: Medicare Other | Attending: Family Medicine | Admitting: Family Medicine

## 2020-01-20 ENCOUNTER — Encounter: Payer: Self-pay | Admitting: Emergency Medicine

## 2020-01-20 DIAGNOSIS — I5032 Chronic diastolic (congestive) heart failure: Secondary | ICD-10-CM | POA: Diagnosis not present

## 2020-01-20 DIAGNOSIS — F419 Anxiety disorder, unspecified: Secondary | ICD-10-CM | POA: Diagnosis not present

## 2020-01-20 DIAGNOSIS — N184 Chronic kidney disease, stage 4 (severe): Secondary | ICD-10-CM | POA: Diagnosis not present

## 2020-01-20 DIAGNOSIS — Z7951 Long term (current) use of inhaled steroids: Secondary | ICD-10-CM | POA: Diagnosis not present

## 2020-01-20 DIAGNOSIS — L03114 Cellulitis of left upper limb: Secondary | ICD-10-CM | POA: Diagnosis not present

## 2020-01-20 DIAGNOSIS — D500 Iron deficiency anemia secondary to blood loss (chronic): Secondary | ICD-10-CM | POA: Insufficient documentation

## 2020-01-20 DIAGNOSIS — Z8616 Personal history of COVID-19: Secondary | ICD-10-CM | POA: Diagnosis not present

## 2020-01-20 DIAGNOSIS — Z87891 Personal history of nicotine dependence: Secondary | ICD-10-CM | POA: Insufficient documentation

## 2020-01-20 DIAGNOSIS — Z9221 Personal history of antineoplastic chemotherapy: Secondary | ICD-10-CM | POA: Diagnosis not present

## 2020-01-20 DIAGNOSIS — F329 Major depressive disorder, single episode, unspecified: Secondary | ICD-10-CM | POA: Diagnosis not present

## 2020-01-20 DIAGNOSIS — R7989 Other specified abnormal findings of blood chemistry: Secondary | ICD-10-CM | POA: Insufficient documentation

## 2020-01-20 DIAGNOSIS — A419 Sepsis, unspecified organism: Secondary | ICD-10-CM | POA: Diagnosis present

## 2020-01-20 DIAGNOSIS — D631 Anemia in chronic kidney disease: Secondary | ICD-10-CM | POA: Diagnosis not present

## 2020-01-20 DIAGNOSIS — Z923 Personal history of irradiation: Secondary | ICD-10-CM | POA: Insufficient documentation

## 2020-01-20 DIAGNOSIS — E876 Hypokalemia: Secondary | ICD-10-CM | POA: Diagnosis not present

## 2020-01-20 DIAGNOSIS — Z79899 Other long term (current) drug therapy: Secondary | ICD-10-CM | POA: Insufficient documentation

## 2020-01-20 DIAGNOSIS — R42 Dizziness and giddiness: Secondary | ICD-10-CM | POA: Diagnosis not present

## 2020-01-20 DIAGNOSIS — I5033 Acute on chronic diastolic (congestive) heart failure: Secondary | ICD-10-CM | POA: Diagnosis present

## 2020-01-20 DIAGNOSIS — I5043 Acute on chronic combined systolic (congestive) and diastolic (congestive) heart failure: Secondary | ICD-10-CM | POA: Diagnosis present

## 2020-01-20 DIAGNOSIS — Z853 Personal history of malignant neoplasm of breast: Secondary | ICD-10-CM | POA: Insufficient documentation

## 2020-01-20 DIAGNOSIS — F32A Depression, unspecified: Secondary | ICD-10-CM | POA: Diagnosis present

## 2020-01-20 DIAGNOSIS — R778 Other specified abnormalities of plasma proteins: Secondary | ICD-10-CM | POA: Diagnosis not present

## 2020-01-20 DIAGNOSIS — K2100 Gastro-esophageal reflux disease with esophagitis, without bleeding: Secondary | ICD-10-CM | POA: Insufficient documentation

## 2020-01-20 DIAGNOSIS — I130 Hypertensive heart and chronic kidney disease with heart failure and stage 1 through stage 4 chronic kidney disease, or unspecified chronic kidney disease: Secondary | ICD-10-CM | POA: Insufficient documentation

## 2020-01-20 DIAGNOSIS — I10 Essential (primary) hypertension: Secondary | ICD-10-CM | POA: Diagnosis present

## 2020-01-20 DIAGNOSIS — K219 Gastro-esophageal reflux disease without esophagitis: Secondary | ICD-10-CM

## 2020-01-20 DIAGNOSIS — Z8501 Personal history of malignant neoplasm of esophagus: Secondary | ICD-10-CM | POA: Diagnosis not present

## 2020-01-20 DIAGNOSIS — J449 Chronic obstructive pulmonary disease, unspecified: Secondary | ICD-10-CM | POA: Insufficient documentation

## 2020-01-20 DIAGNOSIS — Z20822 Contact with and (suspected) exposure to covid-19: Secondary | ICD-10-CM | POA: Insufficient documentation

## 2020-01-20 DIAGNOSIS — Z9981 Dependence on supplemental oxygen: Secondary | ICD-10-CM | POA: Diagnosis not present

## 2020-01-20 DIAGNOSIS — Z85118 Personal history of other malignant neoplasm of bronchus and lung: Secondary | ICD-10-CM | POA: Insufficient documentation

## 2020-01-20 LAB — URINALYSIS, COMPLETE (UACMP) WITH MICROSCOPIC
Bacteria, UA: NONE SEEN
Bilirubin Urine: NEGATIVE
Glucose, UA: NEGATIVE mg/dL
Hgb urine dipstick: NEGATIVE
Ketones, ur: 5 mg/dL — AB
Leukocytes,Ua: NEGATIVE
Nitrite: NEGATIVE
Protein, ur: NEGATIVE mg/dL
Specific Gravity, Urine: 1.009 (ref 1.005–1.030)
pH: 5 (ref 5.0–8.0)

## 2020-01-20 LAB — COMPREHENSIVE METABOLIC PANEL
ALT: 22 U/L (ref 0–44)
AST: 40 U/L (ref 15–41)
Albumin: 3.6 g/dL (ref 3.5–5.0)
Alkaline Phosphatase: 67 U/L (ref 38–126)
Anion gap: 12 (ref 5–15)
BUN: 31 mg/dL — ABNORMAL HIGH (ref 8–23)
CO2: 18 mmol/L — ABNORMAL LOW (ref 22–32)
Calcium: 8.3 mg/dL — ABNORMAL LOW (ref 8.9–10.3)
Chloride: 103 mmol/L (ref 98–111)
Creatinine, Ser: 1.83 mg/dL — ABNORMAL HIGH (ref 0.44–1.00)
GFR calc Af Amer: 33 mL/min — ABNORMAL LOW (ref >60)
GFR calc non Af Amer: 28 mL/min — ABNORMAL LOW (ref >60)
Glucose, Bld: 92 mg/dL (ref 70–99)
Potassium: 3.4 mmol/L — ABNORMAL LOW (ref 3.5–5.1)
Sodium: 133 mmol/L — ABNORMAL LOW (ref 135–145)
Total Bilirubin: 1.2 mg/dL (ref 0.3–1.2)
Total Protein: 7.3 g/dL (ref 6.5–8.1)

## 2020-01-20 LAB — PROCALCITONIN: Procalcitonin: 4.99 ng/mL

## 2020-01-20 LAB — CBC
HCT: 27.7 % — ABNORMAL LOW (ref 36.0–46.0)
Hemoglobin: 9.4 g/dL — ABNORMAL LOW (ref 12.0–15.0)
MCH: 30.5 pg (ref 26.0–34.0)
MCHC: 33.9 g/dL (ref 30.0–36.0)
MCV: 89.9 fL (ref 80.0–100.0)
Platelets: 157 10*3/uL (ref 150–400)
RBC: 3.08 MIL/uL — ABNORMAL LOW (ref 3.87–5.11)
RDW: 15.5 % (ref 11.5–15.5)
WBC: 13.3 10*3/uL — ABNORMAL HIGH (ref 4.0–10.5)
nRBC: 0.1 % (ref 0.0–0.2)

## 2020-01-20 LAB — SARS CORONAVIRUS 2 (TAT 6-24 HRS): SARS Coronavirus 2: NEGATIVE

## 2020-01-20 LAB — C-REACTIVE PROTEIN: CRP: 22.1 mg/dL — ABNORMAL HIGH (ref <1.0)

## 2020-01-20 LAB — SEDIMENTATION RATE: Sed Rate: 63 mm/hr — ABNORMAL HIGH (ref 0–30)

## 2020-01-20 LAB — TROPONIN I (HIGH SENSITIVITY)
Troponin I (High Sensitivity): 18 ng/L — ABNORMAL HIGH (ref <18)
Troponin I (High Sensitivity): 19 ng/L — ABNORMAL HIGH (ref <18)
Troponin I (High Sensitivity): 17 ng/L (ref <18)
Troponin I (High Sensitivity): 17 ng/L (ref <18)

## 2020-01-20 LAB — LACTIC ACID, PLASMA: Lactic Acid, Venous: 0.8 mmol/L (ref 0.5–1.9)

## 2020-01-20 LAB — HIV ANTIBODY (ROUTINE TESTING W REFLEX): HIV Screen 4th Generation wRfx: NONREACTIVE

## 2020-01-20 LAB — BRAIN NATRIURETIC PEPTIDE: B Natriuretic Peptide: 603 pg/mL — ABNORMAL HIGH (ref 0.0–100.0)

## 2020-01-20 IMAGING — CR DG CHEST 1V
1 series · 1 of 1 positions shown · non-contrast
Comparison: 07/05/2018

CLINICAL DATA: Abdominal pain starting today.

EXAM:
CHEST  1 VIEW

[chest pa]
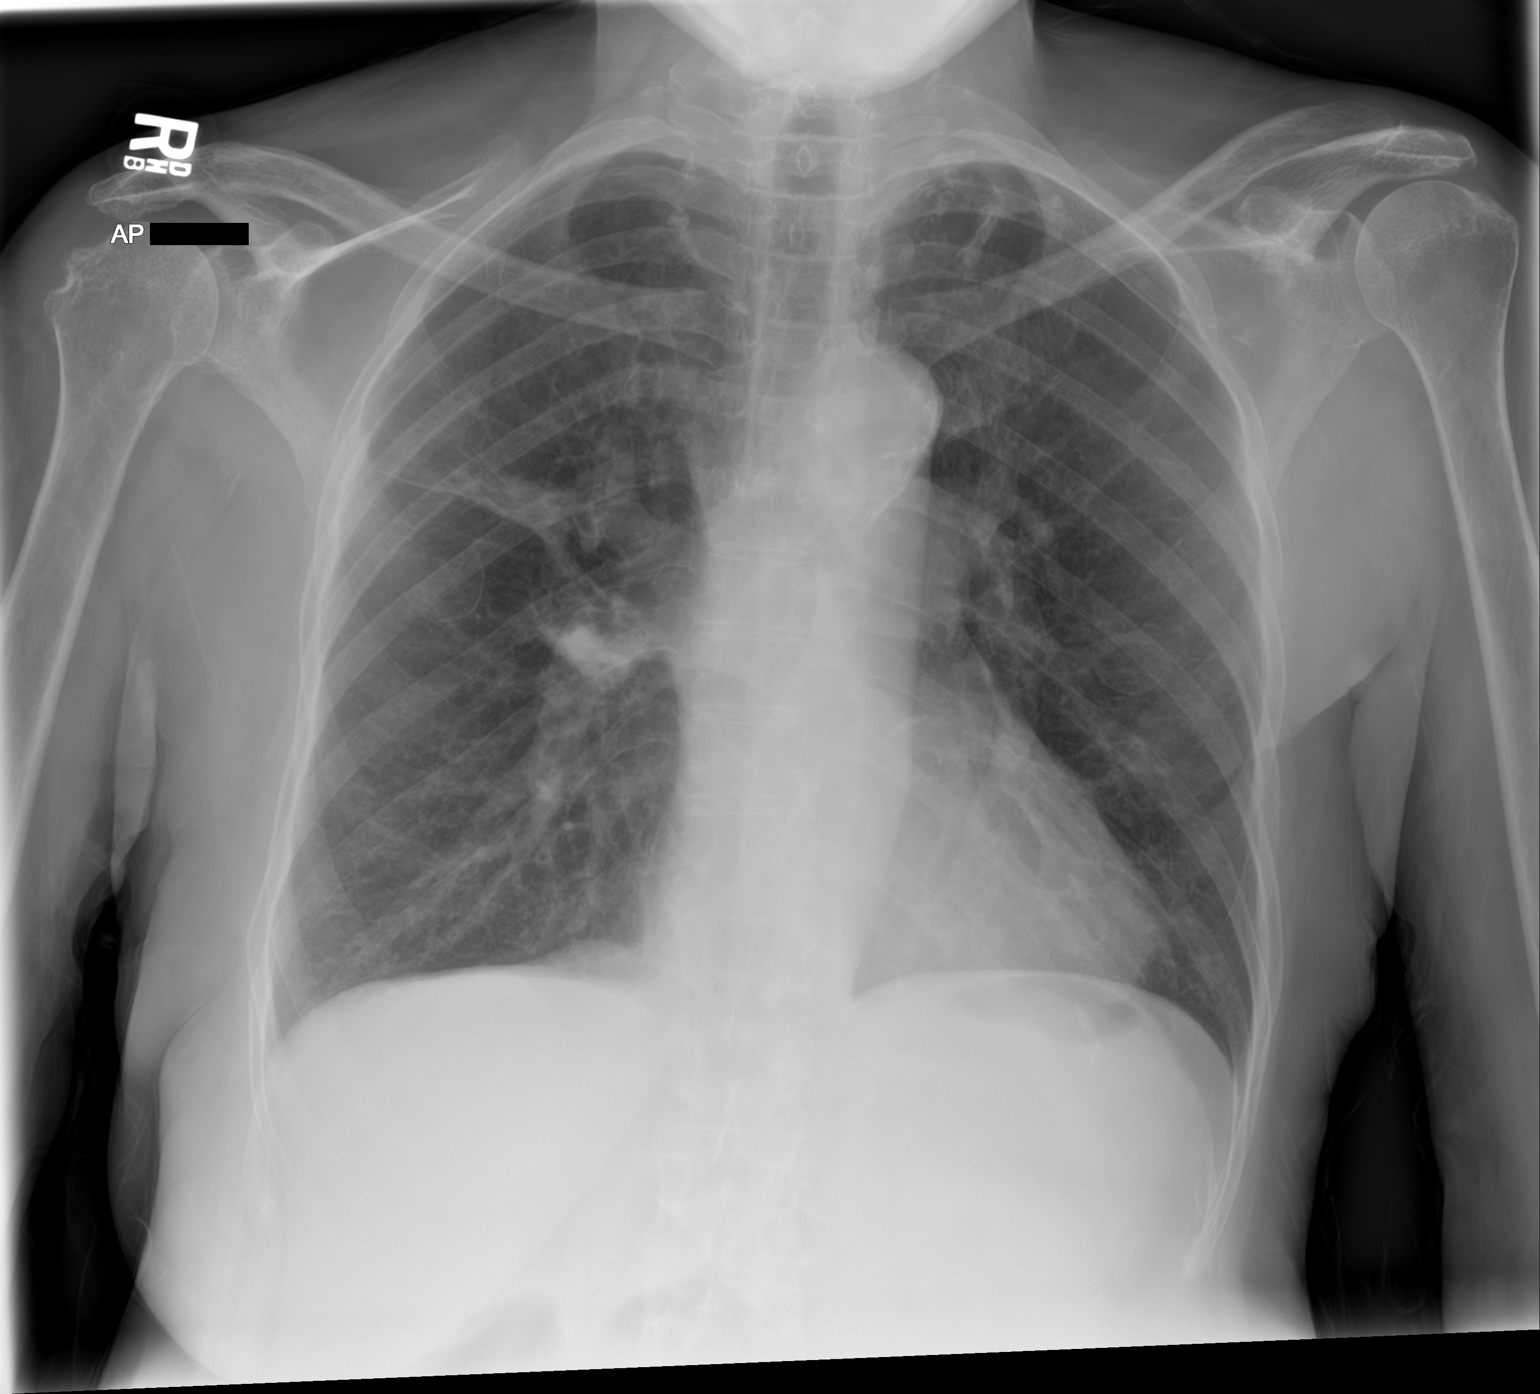

[1 of 1 positions shown; findings below may reference images not displayed]

FINDINGS: Stable right upper lobe scarring. Heart and mediastinal contours are
stable with moderate aortic atherosclerosis of the thoracic aorta.
Mild interstitial edema is noted. Subcortical cystic change about
both humeral heads right greater than left. No acute nor suspicious
osseous abnormalities.
IMPRESSION: Mild interstitial edema and right upper lobe scarring.

## 2020-01-20 MED ORDER — FLUTICASONE FUROATE-VILANTEROL 100-25 MCG/INH IN AEPB
1.0000 | INHALATION_SPRAY | Freq: Every day | RESPIRATORY_TRACT | Status: DC
  Administered 2020-01-20 – 2020-01-21 (×2): 1 via RESPIRATORY_TRACT
  Filled 2020-01-20: qty 28

## 2020-01-20 MED ORDER — OXYCODONE-ACETAMINOPHEN 5-325 MG PO TABS: 1.0000 | ORAL_TABLET | ORAL | Status: DC | PRN

## 2020-01-20 MED ORDER — POTASSIUM CHLORIDE CRYS ER 20 MEQ PO TBCR
20.0000 meq | EXTENDED_RELEASE_TABLET | Freq: Once | ORAL | Status: AC
  Administered 2020-01-20: 20 meq via ORAL
  Filled 2020-01-20: qty 1

## 2020-01-20 MED ORDER — ALBUTEROL SULFATE (2.5 MG/3ML) 0.083% IN NEBU: 2.5000 mg | INHALATION_SOLUTION | RESPIRATORY_TRACT | Status: DC | PRN

## 2020-01-20 MED ORDER — CALCIUM CARBONATE ANTACID 500 MG PO CHEW
0.5000 | CHEWABLE_TABLET | Freq: Every day | ORAL | Status: DC
  Administered 2020-01-20 – 2020-01-21 (×2): 100 mg via ORAL
  Filled 2020-01-20 (×2): qty 1

## 2020-01-20 MED ORDER — SODIUM CHLORIDE 0.9 % IV SOLN
1.0000 g | INTRAVENOUS | Status: DC
  Administered 2020-01-21: 1 g via INTRAVENOUS
  Filled 2020-01-20: qty 10
  Filled 2020-01-20: qty 1

## 2020-01-20 MED ORDER — CALCIUM CARBONATE-VITAMIN D 500-200 MG-UNIT PO TABS
1.0000 | ORAL_TABLET | Freq: Every day | ORAL | Status: DC
  Administered 2020-01-20 – 2020-01-21 (×2): 1 via ORAL
  Filled 2020-01-20 (×2): qty 1

## 2020-01-20 MED ORDER — SUCRALFATE 1 GM/10ML PO SUSP
1.0000 g | Freq: Four times a day (QID) | ORAL | Status: DC
  Administered 2020-01-20 – 2020-01-21 (×4): 1 g via ORAL
  Filled 2020-01-20 (×4): qty 10

## 2020-01-20 MED ORDER — ONDANSETRON HCL 4 MG/2ML IJ SOLN: 4.0000 mg | Freq: Three times a day (TID) | INTRAMUSCULAR | Status: DC | PRN

## 2020-01-20 MED ORDER — SODIUM CHLORIDE 0.9 % IV SOLN
1.0000 g | Freq: Once | INTRAVENOUS | Status: AC
  Administered 2020-01-20: 1 g via INTRAVENOUS

## 2020-01-20 MED ORDER — CITALOPRAM HYDROBROMIDE 20 MG PO TABS
40.0000 mg | ORAL_TABLET | Freq: Every day | ORAL | Status: DC
  Administered 2020-01-20 – 2020-01-21 (×2): 40 mg via ORAL
  Filled 2020-01-20 (×2): qty 2

## 2020-01-20 MED ORDER — FERROUS SULFATE 325 (65 FE) MG PO TABS
325.0000 mg | ORAL_TABLET | Freq: Two times a day (BID) | ORAL | Status: DC
  Administered 2020-01-20 – 2020-01-21 (×2): 325 mg via ORAL
  Filled 2020-01-20 (×2): qty 1

## 2020-01-20 MED ORDER — NEPRO/CARBSTEADY PO LIQD
237.0000 mL | Freq: Three times a day (TID) | ORAL | Status: DC
  Administered 2020-01-20 – 2020-01-21 (×4): 237 mL via ORAL

## 2020-01-20 MED ORDER — HYDRALAZINE HCL 20 MG/ML IJ SOLN: 5.0000 mg | INTRAMUSCULAR | Status: DC | PRN

## 2020-01-20 MED ORDER — ACETAMINOPHEN 325 MG PO TABS
650.0000 mg | ORAL_TABLET | Freq: Four times a day (QID) | ORAL | Status: DC | PRN
  Administered 2020-01-20 – 2020-01-21 (×2): 650 mg via ORAL
  Filled 2020-01-20 (×2): qty 2

## 2020-01-20 MED ORDER — DM-GUAIFENESIN ER 30-600 MG PO TB12
1.0000 | ORAL_TABLET | Freq: Two times a day (BID) | ORAL | Status: DC
  Administered 2020-01-20 – 2020-01-21 (×3): 1 via ORAL
  Filled 2020-01-20 (×3): qty 1

## 2020-01-20 MED ORDER — ADULT MULTIVITAMIN W/MINERALS CH
1.0000 | ORAL_TABLET | Freq: Every day | ORAL | Status: DC
  Administered 2020-01-20 – 2020-01-21 (×2): 1 via ORAL
  Filled 2020-01-20 (×2): qty 1

## 2020-01-20 MED ORDER — CARVEDILOL 6.25 MG PO TABS
6.2500 mg | ORAL_TABLET | Freq: Two times a day (BID) | ORAL | Status: DC
  Administered 2020-01-20 – 2020-01-21 (×2): 6.25 mg via ORAL
  Filled 2020-01-20 (×2): qty 1

## 2020-01-20 MED ORDER — ENOXAPARIN SODIUM 30 MG/0.3ML ~~LOC~~ SOLN
30.0000 mg | SUBCUTANEOUS | Status: DC
  Administered 2020-01-20 – 2020-01-21 (×2): 30 mg via SUBCUTANEOUS
  Filled 2020-01-20 (×4): qty 0.3

## 2020-01-20 MED ORDER — UMECLIDINIUM BROMIDE 62.5 MCG/INH IN AEPB
1.0000 | INHALATION_SPRAY | Freq: Every day | RESPIRATORY_TRACT | Status: DC
  Administered 2020-01-20 – 2020-01-21 (×2): 1 via RESPIRATORY_TRACT
  Filled 2020-01-20: qty 7

## 2020-01-20 MED ORDER — FLUTICASONE-UMECLIDIN-VILANT 100-62.5-25 MCG/INH IN AEPB: 1.0000 | INHALATION_SPRAY | Freq: Every day | RESPIRATORY_TRACT | Status: DC

## 2020-01-20 MED ORDER — SODIUM BICARBONATE 650 MG PO TABS
1300.0000 mg | ORAL_TABLET | Freq: Three times a day (TID) | ORAL | Status: DC
  Administered 2020-01-20 – 2020-01-21 (×4): 1300 mg via ORAL
  Filled 2020-01-20 (×4): qty 2

## 2020-01-20 MED ORDER — SODIUM CHLORIDE 0.9 % IV SOLN: INTRAVENOUS | Status: DC

## 2020-01-20 MED ORDER — LOPERAMIDE HCL 2 MG PO CAPS
2.0000 mg | ORAL_CAPSULE | ORAL | Status: DC | PRN
  Administered 2020-01-20: 2 mg via ORAL
  Filled 2020-01-20 (×2): qty 1

## 2020-01-20 MED ORDER — PANTOPRAZOLE SODIUM 40 MG PO TBEC
40.0000 mg | DELAYED_RELEASE_TABLET | Freq: Two times a day (BID) | ORAL | Status: DC
  Administered 2020-01-20 – 2020-01-21 (×2): 40 mg via ORAL
  Filled 2020-01-20 (×2): qty 1

## 2020-01-20 MED ORDER — ALBUTEROL SULFATE HFA 108 (90 BASE) MCG/ACT IN AERS: 2.0000 | INHALATION_SPRAY | RESPIRATORY_TRACT | Status: DC | PRN

## 2020-01-20 NOTE — ED Notes (Signed)
Admitting MD at bedside.

## 2020-01-20 NOTE — ED Notes (Signed)
Bed assigned at 1231. Room 219.

## 2020-01-20 NOTE — ED Notes (Signed)
US at bedside

## 2020-01-20 NOTE — ED Provider Notes (Signed)
[facility] Emergency Department Provider Note  ____________________________________________   First MD Initiated Contact with Patient 01/20/20 [phone]     (approximate)  I have reviewed the triage vital signs and the nursing notes.   HISTORY  Chief Complaint Dizziness   HPI Danielle [name] is a 64 y.o. female    with below list of previous medical conditions presents to the emergency department secondary to cute onset of "tingling from her head to her feet with subsequent dizziness that began this morning.  Patient states that she was not sleeping at the time it occurred.  Patient also admits to 7 out of 10 left arm pain and swelling and redness that she is noted this morning as well.  Patient denies any fever afebrile on presentation.  Patient denies any weakness numbness gait instability or visual changes.  Patient denies any chest pain or shortness of breath.        Past Medical History:  Diagnosis Date  . Anxiety   . Arthritis   . Breast cancer ([place]) 2000  . Cancer ([place]) left    breast cancer 2000, chemo tx's with total mastectomy and lymph nodes resected.   . Cancer of right lung ([place]) 02/21/2016   rad tx's.   . CHF (congestive heart failure) ([place])   . COPD (chronic obstructive pulmonary disease) ([place])   . Dependence on supplemental oxygen   . Depression   . Heart murmur   . Hypertension   . Lung nodule   . Lymphedema   . Personal history of chemotherapy   . Personal history of radiation therapy   . Shortness of breath dyspnea    with exertion  . Status post chemotherapy 2001   left breast cancer  . Status post radiation therapy 2001   left breast cancer    Patient Active Problem List   Diagnosis Date Noted  . CHF exacerbation ([place]) 11/06/2019  . Diarrhea   . Acute renal failure superimposed on stage 3b chronic kidney disease ([place]) 10/29/2019  . GERD (gastroesophageal reflux disease) 10/29/2019  . Depression 10/29/2019  .  Hyperkalemia 10/14/2019  . History of 2019 novel coronavirus disease (COVID-19) 10/14/2019  . Acute respiratory failure due to COVID-19 ([place]) 09/27/2019  . Respiratory tract infection due to COVID-19 virus 09/27/2019  . Acute blood loss anemia 06/20/2019  . Anemia 06/20/2019  . GI bleeding 06/19/2019  . Anemia of chronic kidney failure, stage 4 (severe) ([place]) 06/12/2019  . Malignant neoplasm of lower third of esophagus ([place]) 05/01/2019  . Acute on chronic respiratory failure with hypoxia and hypercapnia ([place]) 12/22/2018  . Ileostomy dysfunction ([place]) 09/28/2018  . Reflux esophagitis   . Iron deficiency anemia secondary to blood loss (chronic)   . SBO (small bowel obstruction) ([place]) 06/29/2018  . Chronic systolic heart failure ([place]) 06/09/2018  . HTN (hypertension) 06/09/2018  . Atrial fibrillation ([place]) 06/09/2018  . Lymphedema 06/09/2018  . Acute kidney injury superimposed on CKD ([place]) 06/04/2018  . Severe protein-calorie malnutrition ([place]) 06/04/2018  . Blood in stool   . Focal (segmental) acute (reversible) ischemia of large intestine ([place])   . Ulceration of intestine   . Diverticulosis of large intestine without diverticulitis   . Abnormal CT scan, colon   . Colitis   . COPD exacerbation ([place])   . Malnutrition of moderate degree 04/23/2018  . Palliative care by specialist   . DNR (do not resuscitate) discussion   . Weakness generalized   . Hyponatremia 02/10/2017  . Syncope  02/09/2017  . Lung nodule, solitary 07/25/2016  . Malignant neoplasm of upper lobe of right lung ([place]) 07/25/2016  . Carcinoma of overlapping sites of left breast in female, estrogen receptor positive ([place]) 06/22/2016  . Multiple lung nodules 06/22/2016  . Lesion of right lung   . Breast cancer in female [facility]) 02/21/2016  . Moderate COPD (chronic obstructive pulmonary disease) ([place]) 08/24/2014    Past Surgical History:  Procedure Laterality Date  . Breast Biospy Left    [facility]  . BREAST SURGERY      . COLONOSCOPY N/A 04/30/2018   Procedure: COLONOSCOPY;  Surgeon: Virgel Manifold, MD;  Location: [facility] ENDOSCOPY;  Service: Endoscopy;  Laterality: N/A;  . COLONOSCOPY N/A 07/22/2018   Procedure: COLONOSCOPY;  Surgeon: Virgel Manifold, MD;  Location: [facility] ENDOSCOPY;  Service: Endoscopy;  Laterality: N/A;  . DILATION AND CURETTAGE OF UTERUS    . ELECTROMAGNETIC NAVIGATION BROCHOSCOPY Right 04/11/2016   Procedure: ELECTROMAGNETIC NAVIGATION BRONCHOSCOPY;  Surgeon: Vilinda Boehringer, MD;  Location: [facility] ORS;  Service: Cardiopulmonary;  Laterality: Right;  . ESOPHAGOGASTRODUODENOSCOPY N/A 07/22/2018   Procedure: ESOPHAGOGASTRODUODENOSCOPY (EGD);  Surgeon: Virgel Manifold, MD;  Location[facility] ENDOSCOPY;  Service: Endoscopy;  Laterality: N/A;  . ESOPHAGOGASTRODUODENOSCOPY (EGD) WITH PROPOFOL N/A 05/07/2018   Procedure: ESOPHAGOGASTRODUODENOSCOPY (EGD) WITH PROPOFOL;  Surgeon: Lucilla Lame, MD;  Location[facility] ENDOSCOPY;  Service: Endoscopy;  Laterality: N/A;  . ESOPHAGOGASTRODUODENOSCOPY (EGD) WITH PROPOFOL N/A 04/24/2019   Procedure: ESOPHAGOGASTRODUODENOSCOPY (EGD) WITH PROPOFOL;  Surgeon: Jonathon Bellows, MD;  Location[facility]COPY;  Service: Gastroenterology;  Laterality: N/A;  . ESOPHAGOGASTRODUODENOSCOPY (EGD) WITH PROPOFOL N/A 01/12/2020   Procedure: ESOPHAGOGASTRODUODENOSCOPY (EGD) WITH PROPOFOL;  Surgeon: Jonathon Bellows, MD;  Location: [facility] ENDOSCOPY;  Service: Gastroenterology;  Laterality: N/A;  . EUS N/A 05/07/2019   Procedure: FULL UPPER ENDOSCOPIC ULTRASOUND (EUS) RADIAL;  Surgeon: Jola Schmidt, MD;  Location: [facility] ENDOSCOPY;  Service: Endoscopy;  Laterality: N/A;  . history of colonoscopy]    . ILEOSCOPY N/A 07/22/2018   Procedure: ILEOSCOPY THROUGH STOMA;  Surgeon: Virgel Manifold, MD;  Location: [facility] ENDOSCOPY;  Service: Endoscopy;  Laterality: N/A;  . ILEOSTOMY    . ILEOSTOMY N/A 09/08/2018   Procedure: ILEOSTOMY REVISION POSSIBLE CREATION;  Surgeon: Herbert Pun, MD;   Location: [facility] ORS;  Service: General;  Laterality: N/A;  . ILEOSTOMY CLOSURE N/A 08/15/2018   Procedure: DILATION OF ILEOSTOMY STRICTURE;  Surgeon: Herbert Pun, MD;  Location: [facility] ORS;  Service: General;  Laterality: N/A;  . LAPAROTOMY Right 05/04/2018   Procedure: EXPLORATORY LAPAROTOMY right colectomy right and left ostomy;  Surgeon: Herbert Pun, MD;  Location: [facility] ORS;  Service: General;  Laterality: Right;  . LUNG BIOPSY    . MASTECTOMY Left    2000, [facility]  . ROTATOR CUFF REPAIR Right    [facility]    Prior to Admission medications   Medication Sig Start Date End Date Taking? Authorizing Provider  acetaminophen (TYLENOL) 325 MG tablet Take 2 tablets (650 mg total) by mouth every 6 (six) hours as needed for mild pain (or Fever >/= 101). 12/24/18   Gouru, Illene Silver, MD  albuterol (PROVENTIL HFA;VENTOLIN HFA) 108 (90 Base) MCG/ACT inhaler Inhale 2 puffs into the lungs every 6 (six) hours as needed for wheezing or shortness of breath. 04/24/18   Demetrios Loll, MD  calcium carbonate (TUMS - DOSED IN MG ELEMENTAL CALCIUM) 500 MG chewable tablet Chew 0.5 tablets (100 mg of elemental calcium total) by mouth daily. 11/05/19   Enzo Bi, MD  calcium-vitamin D (OSCAL WITH D) [phone]  MG-UNIT tablet Take 1 tablet by mouth daily.    [provider]  carvedilol (COREG) 6.25 MG tablet Take 1 tablet (6.25 mg total) by mouth 2 (two) times daily with a meal. 11/11/19 12/11/19  Ralene Muskrat B, MD  citalopram (CELEXA) 20 MG tablet Take 40 mg by mouth daily.     [provider]  ferrous sulfate 325 (65 FE) MG tablet Take 1 tablet (325 mg total) by mouth 2 (two) times daily with a meal. 07/02/18   [name], MD  Fluticasone-Umeclidin-Vilant 100-62.5-25 MCG/INH AEPB Inhale 1 puff into the lungs daily.  01/07/19   [provider]  guaiFENesin-dextromethorphan (ROBITUSSIN DM) 100-10 MG/5ML syrup Take 5 mLs by mouth every 4 (four) hours as needed for cough.    [provider]  loperamide (IMODIUM A-D) 2 MG tablet Take 1 tablet (2 mg total) by mouth as needed for diarrhea or loose stools. 10/04/18   Herbert Pun, MD  Multiple Vitamin (MULTIVITAMIN WITH MINERALS) TABS tablet Take 1 tablet by mouth daily. 06/06/18   Fritzi Mandes, MD  Nutritional Supplements (FEEDING SUPPLEMENT, NEPRO CARB STEADY,) LIQD Take 237 mLs by mouth 3 (three) times daily between meals. 10/21/19   Loletha Grayer, MD  pantoprazole (PROTONIX) 40 MG tablet Take 1 tablet (40 mg total) by mouth 2 (two) times daily before a meal. 07/23/18   Sudini, Alveta Heimlich, MD  sodium bicarbonate 650 MG tablet Take 2 tablets (1,300 mg total) by mouth 3 (three) times daily. 10/21/19   Loletha Grayer, MD  sucralfate (CARAFATE) 1 GM/10ML suspension Take 10 mLs (1 g total) by mouth 4 (four) times daily. 08/18/19   Noreene Filbert, MD  torsemide (DEMADEX) 20 MG tablet Take 1 tablet by mouth as needed. 12/02/19 12/01/20  [provider]    Allergies Patient has no known allergies.  Family History  Problem Relation Age of Onset  . Breast cancer Mother 38  . Cancer Mother        Breast   . Cirrhosis Father   . Breast cancer Paternal Aunt 72  . Cancer Maternal Aunt        Breast     Social History Social History   Tobacco Use  . Smoking status: Former Smoker    Packs/day: 0.50    Years: 20.00    Pack years: 10.00    Types: Cigarettes    Quit date: 07/02/2012    Years since quitting: 7.5  . Smokeless tobacco: Current User    Types: Snuff  . Tobacco comment: quit 2014  Substance Use Topics  . Alcohol use: Not Currently    Comment: Occasionally  . Drug use: No    Review of Systems Constitutional: No fever/chills Eyes: No visual changes. ENT: No sore throat. Cardiovascular: Denies chest pain. Respiratory: Denies shortness of breath. Gastrointestinal: No abdominal pain.  No nausea, no vomiting.  No diarrhea.  No constipation. Genitourinary: Negative for  dysuria. Musculoskeletal: Negative for neck pain.  Negative for back pain.  Positive for left arm pain swelling and redness Integumentary: Negative for rash. Neurological: Negative for headaches, focal weakness or numbness.  Positive for dizziness   ____________________________________________   PHYSICAL EXAM:  VITAL SIGNS: ED Triage Vitals  Enc Vitals Group     BP 01/20/20 0441 (!) 143/104     Pulse Rate 01/20/20 0441 (!) 110     Resp 01/20/20 0441 20     Temp 01/20/20 0441 99.3 F (37.4 C)     Temp Source 01/20/20 0441 Oral  SpO2 01/20/20 0441 94 %     Weight 01/20/20 0437 56.2 kg (124 lb)     Height 01/20/20 0437 1.676 m (5\' 6" )     Head Circumference --      Peak Flow --      Pain Score 01/20/20 0437 7     Pain Loc --      Pain Edu? --      Excl. in [place]? --     Constitutional: Alert and oriented.  Eyes: Conjunctivae are normal.  Head: Atraumatic. Mouth/Throat: Patient is wearing a mask. Neck: No stridor.  No meningeal signs.   Cardiovascular: Normal rate, regular rhythm. Good peripheral circulation. Grossly normal heart sounds. Respiratory: Normal respiratory effort.  No retractions. Gastrointestinal: Soft and nontender. No distention.  Musculoskeletal: No lower extremity tenderness nor edema. No gross deformities of extremities. Neurologic:  Normal speech and language. No gross focal neurologic deficits are appreciated.  Skin: Left forearm/arm swollen hot to touch with blanching erythema. pictorial depiction below.     Psychiatric: Mood and affect are normal. Speech and behavior are normal.  ____________________________________________   LABS (all labs ordered are listed, but only abnormal results are displayed)  Labs Reviewed  CBC - Abnormal; Notable for the following components:      Result Value   WBC 13.3 (*)    RBC 3.08 (*)    Hemoglobin 9.4 (*)    HCT 27.7 (*)    All other components within normal limits  COMPREHENSIVE METABOLIC PANEL -  Abnormal; Notable for the following components:   Sodium 133 (*)    Potassium 3.4 (*)    CO2 18 (*)    BUN 31 (*)    Creatinine, Ser 1.83 (*)    Calcium 8.3 (*)    GFR calc non Af Amer 28 (*)    GFR calc Af Amer 33 (*)    All other components within normal limits  BRAIN NATRIURETIC PEPTIDE - Abnormal; Notable for the following components:   B Natriuretic Peptide 603.0 (*)    All other components within normal limits  TROPONIN I (HIGH SENSITIVITY) - Abnormal; Notable for the following components:   Troponin I (High Sensitivity) 18 (*)    All other components within normal limits  URINALYSIS, COMPLETE (UACMP) WITH MICROSCOPIC   ____________________________________________  EKG  ED ECG REPORT I, [place] N Hadli Vandemark, the attending physician, personally viewed and interpreted this ECG.   Date: 01/20/2020  EKG Time: 4:46 AM  Rate: 101  Rhythm: Sinus tachycardia  Axis: Normal  Intervals: Normal  ST&T Change: None  ____________________________________________  RADIOLOGY I, [place] N Shuan Statzer, personally viewed and evaluated these images (plain radiographs) as part of my medical decision making, as well as reviewing the written report by the radiologist.  ED MD interpretation: Venous ultrasound left lower extremity revealed no evidence of DVT per radiologist.   Official radiology report(s): US Venous Img Upper Uni Left  Result Date: 01/20/2020 CLINICAL DATA:  Left upper extremity pain and swelling. EXAM: Left UPPER EXTREMITY VENOUS DOPPLER ULTRASOUND TECHNIQUE: Gray-scale sonography with graded compression, as well as color Doppler and duplex ultrasound were performed to evaluate the upper extremity deep venous system from the level of the subclavian vein and including the jugular, axillary, basilic, radial, ulnar and upper cephalic vein. Spectral Doppler was utilized to evaluate flow at rest and with distal augmentation maneuvers. COMPARISON:  None. FINDINGS: Contralateral Subclavian  Vein: Respiratory phasicity is normal and symmetric with the symptomatic side. No evidence of thrombus. Normal  compressibility. Internal Jugular Vein: No evidence of thrombus. Normal compressibility, respiratory phasicity and response to augmentation. Subclavian Vein: No evidence of thrombus. Normal compressibility, respiratory phasicity and response to augmentation. Axillary Vein: No evidence of thrombus. Normal compressibility, respiratory phasicity and response to augmentation. Cephalic Vein: No evidence of thrombus. Normal compressibility, respiratory phasicity and response to augmentation. Basilic Vein: No evidence of thrombus. Normal compressibility, respiratory phasicity and response to augmentation. Brachial Veins: No evidence of thrombus. Normal compressibility, respiratory phasicity and response to augmentation. Radial Veins: No evidence of thrombus. Normal compressibility, respiratory phasicity and response to augmentation. Ulnar Veins: No evidence of thrombus. Normal compressibility, respiratory phasicity and response to augmentation. Venous Reflux:  None visualized. Other Findings: Subcutaneous edema noted in the forearm area without discrete fluid collection/abscess. IMPRESSION: No evidence of DVT within the left upper extremity. Electronically Signed   By: Marijo Sanes M.D.   On: 01/20/2020 05:42   DG Chest Portable 1 View  Result Date: 01/20/2020 CLINICAL DATA:  Shortness of breath and dizziness. EXAM: PORTABLE CHEST 1 VIEW COMPARISON:  Chest x-ray 11/06/2019 FINDINGS: The cardiac silhouette, mediastinal and hilar contours are within normal limits and stable. Stable tortuosity and calcification of the thoracic aorta. Chronic underlying emphysematous changes and pulmonary scarring. Persistent right upper lobe density. No definite pleural effusions or pulmonary edema. The bony thorax is intact. IMPRESSION: 1. Chronic underlying emphysematous changes and pulmonary scarring. 2. Stable right upper  lobe density. 3. No infiltrates, edema or effusions. Electronically Signed   By: Marijo Sanes M.D.   On: 01/20/2020 05:16    ___________  Procedures   ____________________________________________   INITIAL IMPRESSION / MDM / [place] / ED COURSE  As part of my medical decision making, I reviewed the following data within the electronic MEDICAL RECORD NUMBER   64 year old female presented with above-stated history and physical exam with differential diagnosis including but not limited to cellulitis of the left forearm/arm, DVT.  In addition regarding the patient's generalized paresthesia considered possibly of hyponatremia however patient sodium is 133, hypocalcemia patient's calcium is 8.3.  CT scan of the head performed which was negative.  Patient given IV ceftriaxone 1 g secondary to left arm cellulitis.  Patient discussed with Dr. Damita Dunnings for hospital admission for further evaluation and management.      ____________________________________________  FINAL CLINICAL IMPRESSION(S) / ED DIAGNOSES  Final diagnoses:  Left arm cellulitis     MEDICATIONS GIVEN DURING THIS VISIT:  Medications  cefTRIAXone (ROCEPHIN) 1 g in sodium chloride 0.9 % 100 mL IVPB (has no administration in time range)     ED Discharge Orders    None      *Please note:  Danielle [name] was evaluated in Emergency Department on 01/20/2020 for the symptoms described in the history of present illness. She was evaluated in the context of the global COVID-19 pandemic, which necessitated consideration that the patient might be at risk for infection with the SARS-CoV-2 virus that causes COVID-19. Institutional protocols and algorithms that pertain to the evaluation of patients at risk for COVID-19 are in a state of rapid change based on information released by regulatory bodies including the CDC and federal and state organizations. These policies and algorithms were followed during the patient's care in the  ED.  Some ED evaluations and interventions may be delayed as a result of limited staffing during the pandemic.*  Note:  This document was prepared using Dragon voice recognition software and may include unintentional dictation errors.   [place] Shark,  Valli Glance, MD 01/21/20 [phone]

## 2020-01-20 NOTE — ED Triage Notes (Signed)
Patient ambulatory to triage with steady gait, without difficulty or distress noted, mask in place; pt reports dizziness and SHOB with left arm pain since 3am

## 2020-01-20 NOTE — Evaluation (Signed)
Occupational Therapy Evaluation Patient Details Name: Danielle [name] MRN: [id number] DOB: 1956/01/02 Today's Date: 01/20/2020    History of Present Illness Pt is 64 y/o F with PMH: moderate COPD, BRCA 2000 with L mastectomy, lung CA with radiation tx 2017, ileostomy, anemia, CKD4, dCHF, and recent endoscopy to evaluate for esophageal cancer that was inconclusive with recommendation for f/u in 3 months. Pt presented to ED 4/7 d/t c/o L UE pain. Pt found to have cellulitis of the L arm.   Clinical Impression   Pt was seen for OT evaluation this date. Prior to hospital admission, pt was Indep with all ADLs/ADL mobility with no AD. Pt lives in mobile home and her son lives with her. She has 5 STE with R railing and while she does have AD for fxl mobility, she reports not typically using/requiring. Currently pt demonstrates impairments as described below (See OT problem list) which functionally limit her ability to perform ADL/self-care tasks. Pt currently requires SBA for ADL transfers and extended time for dressing tasks and setup for meal time to open containers/packets d/t increased swelling/pain in L UE and subsequent decreased grip/pinch strength noted at this time.  Pt would benefit from skilled OT to address noted impairments and functional limitations (see below for any additional details), assist with edema mgt, and offer task adaptation/modificaiton if necessary to promote return to PLOF independence. Anticipate that pt could benefit from outpt OT-specifically hand therapy, if strength in grip and pinch are still lacking once swelling and pain are more manageable. Of note: pt with c/o R LE "tightness" with standing/functional mobility. This is relayed to RN who messages physician.     Follow Up Recommendations  Outpatient OT(potential need for outpt hand therapy to strengthen grip if does not improve when pain/edema subsides.)    Equipment Recommendations  None recommended by OT     Recommendations for Other Services       Precautions / Restrictions Restrictions Weight Bearing Restrictions: No      Mobility Bed Mobility Overal bed mobility: Independent                Transfers Overall transfer level: Needs assistance Equipment used: None Transfers: Sit to/from Stand Sit to Stand: Supervision         General transfer comment: Pt steady, supv given d/t pt needing ot modify approach for push to stand since it is currently painful to push through L UE.    Balance Overall balance assessment: Mild deficits observed, not formally tested                                         ADL either performed or assessed with clinical judgement   ADL                                         General ADL Comments: pt able to perform most UB/LB ADLs I'ly, but does required extended time for UB/LB dressing and setup to open packets/containers at meal time d/t pain and sweling with decreased grip/pinch strength of L hand. Ex: on assessment, required extended time to don sock in sitting d/t difficulty pinching with L hand.     Vision Baseline Vision/History: Wears glasses Wears Glasses: At all times Patient Visual Report: No change from baseline  Perception     Praxis      Pertinent Vitals/Pain Pain Assessment: Faces Faces Pain Scale: Hurts even more Pain Location: L UE Pain Descriptors / Indicators: Tightness;Sharp Pain Intervention(s): Limited activity within patient's tolerance;Monitored during session;Repositioned     Hand Dominance Right   Extremity/Trunk Assessment Upper Extremity Assessment Upper Extremity Assessment: RUE deficits/detail;LUE deficits/detail RUE Deficits / Details: shld, elbow, grip MMT 4+/5 LUE Deficits / Details: completes all ROM appropriately, but requires increased time for shld flexion/abduction. Shld/elbow MMT NT d/t pain, grip 3+/5   Lower Extremity Assessment Lower Extremity  Assessment: Defer to PT evaluation;Generalized weakness(reports tightness in R LE with fxl mobility that has been happening for a few weeks now.)   Cervical / Trunk Assessment Cervical / Trunk Assessment: Normal   Communication Communication Communication: No difficulties   Cognition Arousal/Alertness: Awake/alert Behavior During Therapy: WFL for tasks assessed/performed Overall Cognitive Status: Within Functional Limits for tasks assessed                                     General Comments       Exercises Other Exercises Other Exercises: OT facilitates education re: role of OT in acute setting. Pt verbalized good understanding. Other Exercises: OT facilitats education re: positioning recommendations including elevating L UE to reduce swelling. Pt verbalized good understanding.   Shoulder Instructions      Home Living Family/patient expects to be discharged to:: Private residence Living Arrangements: Children(son lives with her) Available Help at Discharge: Family;Available PRN/intermittently(son works but can help out at times if needed.) Type of Home: Mobile home Home Access: Stairs to enter [employer] of Steps: 5 Entrance Stairs-Rails: Right Home Layout: One level     Bathroom Shower/Tub: Teacher, early years/pre: Standard Bathroom Accessibility: Yes   Home Equipment: Environmental consultant - 2 wheels;Bedside commode;Cane - single point;Shower seat   Additional Comments: states she sponge bathes for safety because she has had difficulty getting up off shower seat since ileostomy      Prior Functioning/Environment Level of Independence: Independent        Comments: Pt reports that she has AD for fxl mobility if needed, but does not typically use and she completes all her ADLs I'ly.        OT Problem List: Decreased strength;Cardiopulmonary status limiting activity;Impaired UE functional use;Pain;Increased edema      OT  Treatment/Interventions: Self-care/ADL training;Therapeutic exercise;Therapeutic activities;Manual therapy;Patient/family education    OT Goals(Current goals can be found in the care plan section) Acute Rehab OT Goals Patient Stated Goal: to get this arm feeling better OT Goal Formulation: With patient Time For Goal Achievement: 02/03/20 Potential to Achieve Goals: Good  OT Frequency: Min 2X/week   Barriers to D/C:            Co-evaluation              AM-PAC OT "6 Clicks" Daily Activity     Outcome Measure Help from another person eating meals?: None Help from another person taking care of personal grooming?: None Help from another person toileting, which includes using toliet, bedpan, or urinal?: A Little Help from another person bathing (including washing, rinsing, drying)?: A Little Help from another person to put on and taking off regular upper body clothing?: None Help from another person to put on and taking off regular lower body clothing?: A Little 6 Click Score: 21   End of  Session Equipment Utilized During Treatment: Gait belt Nurse Communication: Mobility status;Other (comment)(notified RN of pt c/o "tightness" in R LE with fxl mobility/ambulation. RN notified physician.)  Activity Tolerance: Patient tolerated treatment well Patient left: in bed;with call bell/phone within reach  OT Visit Diagnosis: Pain;Muscle weakness (generalized) (M62.81) Pain - Right/Left: Left Pain - part of body: Arm                Time: 1100-1127 OT Time Calculation (min): 27 min Charges:  OT General Charges $OT Visit: 1 Visit OT Evaluation $OT Eval Moderate Complexity: 1 Mod OT Treatments $Therapeutic Activity: 8-22 mins  Gerrianne Scale, MS, OTR/L ascom [phone] 01/20/20, 11:57 AM

## 2020-01-20 NOTE — H&P (Addendum)
History and Physical    Danielle [name] PRF:[id number] DOB: 07-May-1956 DOA: 01/20/2020  Referring MD/NP/PA:   PCP: [facility]   Patient coming from:  The patient is coming from home.  At baseline, pt is independent for most of ADL.        Chief Complaint: Left arm pain  HPI: Danielle [name] is a 64 y.o. female with medical history significant of hypertension, COPD on as needed oxygen at home, GERD, depression, left breast cancer (s/p of chemotherapy, radiation therapy and mastectomy), dCHF, right lung cancer (s/pf radiation therapy), ileostomy, anemia, former smoker, CKD stage IV, s/p of ileostomy, COVID-19 infection 09/2019, who presents with left arm pain.  Pt states that her left arm pain started yesterday morning, which is constant, sharp, 7 out of 10 severity, nonradiating.  The left forearm is also has swelling, erythematous and warm.  Denies fever or chills. She has chronic mild cough and shortness of breath due to COPD intermittently, denies chest pain. No nausea, vomiting, diarrhea, abdominal pain, symptoms of UTI. Patient also reports dizziness, and tingling from head to toe on both sides.  No unilateral weakness in extremities.  No facial droop or slurred speech.  ED Course: pt was found to have WBC 13.3, troponin 18, BNP 603, pending COVID-19 PCR, potassium 3.4, stable renal function, temperature 99.3, blood pressure 126/77, heart rate 110 --> 97, RR 30, oxygen saturation 94% on room air.  Chest x-ray showed chronic emphysematous change in the right upper lobe density without obvious infiltration or edema.  CT head is negative.  Left upper extremity venous Doppler negative for DVT.  Patient is placed on [facility] [place] for observation.   Review of Systems:   General: no fevers, chills, no body weight gain, has fatigue HEENT: no blurry vision, hearing changes or sore throat Respiratory: has dyspnea, coughing, no wheezing CV: no chest pain, no palpitations GI: no  nausea, vomiting, abdominal pain, diarrhea, constipation GU: no dysuria, burning on urination, increased urinary frequency, hematuria  Ext: no leg edema. Has left forearm swelling and pain. Neuro: no unilateral weakness, no vision change or hearing loss. Has dizziness and tingling.  Skin: no rash, no skin tear. MSK: No muscle spasm, no deformity, no limitation of range of movement in spin Heme: No easy bruising.  Travel history: No recent long distant travel.  Allergy: No Known Allergies  Past Medical History:  Diagnosis Date  . Anxiety   . Arthritis   . Breast cancer ([place]) 2000  . Cancer ([place]) left    breast cancer 2000, chemo tx's with total mastectomy and lymph nodes resected.   . Cancer of right lung ([place]) 02/21/2016   rad tx's.   . CHF (congestive heart failure) ([place])   . COPD (chronic obstructive pulmonary disease) ([place])   . Dependence on supplemental oxygen   . Depression   . Heart murmur   . Hypertension   . Lung nodule   . Lymphedema   . Personal history of chemotherapy   . Personal history of radiation therapy   . Shortness of breath dyspnea    with exertion  . Status post chemotherapy 2001   left breast cancer  . Status post radiation therapy 2001   left breast cancer    Past Surgical History:  Procedure Laterality Date  . Breast Biospy Left    [facility]  . BREAST SURGERY    . COLONOSCOPY N/A 04/30/2018   Procedure: COLONOSCOPY;  Surgeon: Virgel Manifold, MD;  Location:  [facility];  Service: Endoscopy;  Laterality: N/A;  . COLONOSCOPY N/A 07/22/2018   Procedure: COLONOSCOPY;  Surgeon: Virgel Manifold, MD;  Location: [facility] ENDOSCOPY;  Service: Endoscopy;  Laterality: N/A;  . DILATION AND CURETTAGE OF UTERUS    . ELECTROMAGNETIC NAVIGATION BROCHOSCOPY Right 04/11/2016   Procedure: ELECTROMAGNETIC NAVIGATION BRONCHOSCOPY;  Surgeon: Vilinda Boehringer, MD;  Location: [facility] ORS;  Service: Cardiopulmonary;  Laterality: Right;  . ESOPHAGOGASTRODUODENOSCOPY N/A  07/22/2018   Procedure: ESOPHAGOGASTRODUODENOSCOPY (EGD);  Surgeon: Virgel Manifold, MD;  Location: [facility] ENDOSCOPY;  Service: Endoscopy;  Laterality: N/A;  . ESOPHAGOGASTRODUODENOSCOPY (EGD) WITH PROPOFOL N/A 05/07/2018   Procedure: ESOPHAGOGASTRODUODENOSCOPY (EGD) WITH PROPOFOL;  Surgeon: Lucilla Lame, MD;  Location: [facility] ENDOSCOPY;  Service: Endoscopy;  Laterality: N/A;  . ESOPHAGOGASTRODUODENOSCOPY (EGD) WITH PROPOFOL N/A 04/24/2019   Procedure: ESOPHAGOGASTRODUODENOSCOPY (EGD) WITH PROPOFOL;  Surgeon: Jonathon Bellows, MD;  Location: [facility] ENDOSCOPY;  Service: Gastroenterology;  Laterality: N/A;  . ESOPHAGOGASTRODUODENOSCOPY (EGD) WITH PROPOFOL N/A 01/12/2020   Procedure: ESOPHAGOGASTRODUODENOSCOPY (EGD) WITH PROPOFOL;  Surgeon: Jonathon Bellows, MD;  Location: [facility] END[facility]COPY;  Service: Gastroenterology;  Laterality: N/A;  . EUS N/A 05/07/2019   Procedure: FULL UPPER ENDOSCOPIC ULTRASOUND (EUS) RADIAL;  Surgeon: Jola Schmidt, MD;  Location: [facility] ENDOSCOPY;  Service: Endoscopy;  Laterality: N/A;  . history of colonoscopy]    . ILEOSCOPY N/A 07/22/2018   Procedure: ILEOSCOPY THROUGH STOMA;  Surgeon: Virgel Manifold, MD;  Location: [facility] ENDOSCOPY;  Service: Endoscopy;  Laterality: N/A;  . ILEOSTOMY    . ILEOSTOMY N/A 09/08/2018   Procedure: ILEOSTOMY REVISION POSSIBLE CREATION;  Surgeon: Herbert Pun, MD;  Location: [facility] ORS;  Service: General;  Laterality: N/A;  . ILEOSTOMY CLOSURE N/A 08/15/2018   Procedure: DILATION OF ILEOSTOMY STRICTURE;  Surgeon: Herbert Pun, MD;  Location: [facility] ORS;  Service: General;  Laterality: N/A;  . LAPAROTOMY Right 05/04/2018   Procedure: EXPLORATORY LAPAROTOMY right colectomy right and left ostomy;  Surgeon: Herbert Pun, MD;  Location: [facility] ORS;  Service: General;  Laterality: Right;  . LUNG BIOPSY    . MASTECTOMY Left    2000, [facility]  . ROTATOR CUFF REPAIR Right    [facility]    Social History:  reports that she quit smoking about 7 years ago.  Her smoking use included cigarettes. She has a 10.00 pack-year smoking history. Her smokeless tobacco use includes snuff. She reports previous alcohol use. She reports that she does not use drugs.  Family History:  Family History  Problem Relation Age of Onset  . Breast cancer Mother 65  . Cancer Mother        Breast   . Cirrhosis Father   . Breast cancer Paternal Aunt 16  . Cancer Maternal Aunt        Breast      Prior to Admission medications   Medication Sig Start Date End Date Taking? Authorizing Provider  acetaminophen (TYLENOL) 325 MG tablet Take 2 tablets (650 mg total) by mouth every 6 (six) hours as needed for mild pain (or Fever >/= 101). 12/24/18   Gouru, Illene Silver, MD  albuterol (PROVENTIL HFA;VENTOLIN HFA) 108 (90 Base) MCG/ACT inhaler Inhale 2 puffs into the lungs every 6 (six) hours as needed for wheezing or shortness of breath. 04/24/18   Demetrios Loll, MD  calcium carbonate (TUMS - DOSED IN MG ELEMENTAL CALCIUM) 500 MG chewable tablet Chew 0.5 tablets (100 mg of elemental calcium total) by mouth daily. 11/05/19   Enzo Bi, MD  calcium-vitamin D (OSCAL WITH D) 250-125 MG-UNIT tablet Take 1 tablet  by mouth daily.    [provider]  carvedilol (COREG) 6.25 MG tablet Take 1 tablet (6.25 mg total) by mouth 2 (two) times daily with a meal. 11/11/19 12/11/19  Ralene Muskrat B, MD  citalopram (CELEXA) 20 MG tablet Take 40 mg by mouth daily.     [provider]  ferrous sulfate 325 (65 FE) MG tablet Take 1 tablet (325 mg total) by mouth 2 (two) times daily with a meal. 07/02/18   [name], MD  Fluticasone-Umeclidin-Vilant 100-62.5-25 MCG/INH AEPB Inhale 1 puff into the lungs daily.  01/07/19   [provider]  guaiFENesin-dextromethorphan (ROBITUSSIN DM) 100-10 MG/5ML syrup Take 5 mLs by mouth every 4 (four) hours as needed for cough.    [provider]  loperamide (IMODIUM A-D) 2 MG tablet Take 1 tablet (2 mg total) by mouth as needed for  diarrhea or loose stools. 10/04/18   Herbert Pun, MD  Multiple Vitamin (MULTIVITAMIN WITH MINERALS) TABS tablet Take 1 tablet by mouth daily. 06/06/18   Fritzi Mandes, MD  Nutritional Supplements (FEEDING SUPPLEMENT, NEPRO CARB STEADY,) LIQD Take 237 mLs by mouth 3 (three) times daily between meals. 10/21/19   Loletha Grayer, MD  pantoprazole (PROTONIX) 40 MG tablet Take 1 tablet (40 mg total) by mouth 2 (two) times daily before a meal. 07/23/18   Sudini, Alveta Heimlich, MD  sodium bicarbonate 650 MG tablet Take 2 tablets (1,300 mg total) by mouth 3 (three) times daily. 10/21/19   Loletha Grayer, MD  sucralfate (CARAFATE) 1 GM/10ML suspension Take 10 mLs (1 g total) by mouth 4 (four) times daily. 08/18/19   Noreene Filbert, MD  torsemide (DEMADEX) 20 MG tablet Take 1 tablet by mouth as needed. 12/02/19 12/01/20  [provider]    Physical Exam: Vitals:   01/20/20 0509 01/20/20 0510 01/20/20 0530 01/20/20 0600  BP:  (!) 146/81 135/82 126/77  Pulse: (!) 104  100 97  Resp:   (!) 27 (!) 25  Temp:      TempSrc:      SpO2: 95% 96% 98% 98%  Weight:      Height:       General: Not in acute distress HEENT:       Eyes: PERRL, EOMI, no scleral icterus.       ENT: No discharge from the ears and nose, no pharynx injection, no tonsillar enlargement.        Neck: No JVD, no bruit, no mass felt. Heme: No neck lymph node enlargement. Cardiac: S1/S2, RRR, No murmurs, No gallops or rubs. Respiratory: No rales, wheezing, rhonchi or rubs. GI: Soft, nondistended, nontender, no rebound pain, no organomegaly, BS present.  GU: No hematuria Ext: No pitting leg edema bilaterally. 1+DP/PT pulse bilaterally. Has tenderness, swelling, erythema and warmth in left forearm Musculoskeletal: No joint deformities, No joint redness or warmth, no limitation of ROM in spin. Skin: No rashes.  Neuro: Alert, oriented X3, cranial nerves II-XII grossly intact, moves all extremities normally. Muscle strength 5/5 in all  extremities, sensation to light touch intact. Psych: Patient is not psychotic, no suicidal or hemocidal ideation.  Labs on Admission: I have personally reviewed following labs and imaging studies  CBC: Recent Labs  Lab 01/20/20 0502  WBC 13.3*  HGB 9.4*  HCT 27.7*  MCV 89.9  PLT 086   Basic Metabolic Panel: Recent Labs  Lab 01/20/20 0502  NA 133*  K 3.4*  CL 103  CO2 18*  GLUCOSE 92  BUN 31*  CREATININE 1.83*  CALCIUM 8.3*   GFR: Estimated Creatinine Clearance: 27.2 mL/min (A) (by C-G formula based on SCr of 1.83 mg/dL (H)). Liver Function Tests: Recent Labs  Lab 01/20/20 0502  AST 40  ALT 22  ALKPHOS 67  BILITOT 1.2  PROT 7.3  ALBUMIN 3.6   No results for input(s): LIPASE, AMYLASE in the last 168 hours. No results for input(s): AMMONIA in the last 168 hours. Coagulation Profile: No results for input(s): INR, PROTIME in the last 168 hours. Cardiac Enzymes: No results for input(s): CKTOTAL, CKMB, CKMBINDEX, TROPONINI in the last 168 hours. BNP (last 3 results) No results for input(s): PROBNP in the last 8760 hours. HbA1C: No results for input(s): HGBA1C in the last 72 hours. CBG: No results for input(s): GLUCAP in the last 168 hours. Lipid Profile: No results for input(s): CHOL, HDL, LDLCALC, TRIG, CHOLHDL, LDLDIRECT in the last 72 hours. Thyroid Function Tests: No results for input(s): TSH, T4TOTAL, FREET4, T3FREE, THYROIDAB in the last 72 hours. Anemia Panel: No results for input(s): VITAMINB12, FOLATE, FERRITIN, TIBC, IRON, RETICCTPCT in the last 72 hours. Urine analysis:    Component Value Date/Time   COLORURINE YELLOW (A) 10/29/2019 1929   APPEARANCEUR HAZY (A) 10/29/2019 1929   APPEARANCEUR Clear 12/06/2013 2245   LABSPEC 1.020 10/29/2019 1929   LABSPEC 1.015 12/06/2013 2245   PHURINE 5.0 10/29/2019 1929   GLUCOSEU NEGATIVE 10/29/2019 1929   GLUCOSEU Negative 12/06/2013 2245   HGBUR NEGATIVE 10/29/2019 [place] NEGATIVE 10/29/2019  1929   BILIRUBINUR Negative 12/06/2013 2245   KETONESUR NEGATIVE 10/29/2019 1929   PROTEINUR NEGATIVE 10/29/2019 1929   NITRITE NEGATIVE 10/29/2019 1929   LEUKOCYTESUR SMALL (A) 10/29/2019 1929   LEUKOCYTESUR Negative 12/06/2013 2245   Sepsis Labs: _0 (procalcitonin:4,lacticidven:4) )No results found for this or any previous visit (from the past 240 hour(s)).   Radiological Exams on Admission: CT Head Wo Contrast  Result Date: 01/20/2020 CLINICAL DATA:  Central vertigo. EXAM: CT HEAD WITHOUT CONTRAST TECHNIQUE: Contiguous axial images were obtained from the base of the skull through the vertex without intravenous contrast. COMPARISON:  05/15/2018 FINDINGS: Brain: No evidence of acute infarction, hemorrhage, hydrocephalus, extra-axial collection or mass lesion/mass effect. Vascular: No hyperdense vessel or unexpected calcification. Skull: Normal. Negative for fracture or focal lesion. Sinuses/Orbits: No acute finding. IMPRESSION: Negative head CT. Electronically Signed   By: Monte Fantasia M.D.   On: 01/20/2020 06:45   US Venous Img Upper Uni Left  Result Date: 01/20/2020 CLINICAL DATA:  Left upper extremity pain and swelling. EXAM: Left UPPER EXTREMITY VENOUS DOPPLER ULTRASOUND TECHNIQUE: Gray-scale sonography with graded compression, as well as color Doppler and duplex ultrasound were performed to evaluate the upper extremity deep venous system from the level of the subclavian vein and including the jugular, axillary, basilic, radial, ulnar and upper cephalic vein. Spectral Doppler was utilized to evaluate flow at rest and with distal augmentation maneuvers. COMPARISON:  None. FINDINGS: Contralateral Subclavian Vein: Respiratory phasicity is normal and symmetric with the symptomatic side. No evidence of thrombus. Normal compressibility. Internal Jugular Vein: No evidence of thrombus. Normal compressibility, respiratory phasicity and response to augmentation. Subclavian Vein: No evidence of  thrombus. Normal compressibility, respiratory phasicity and response to augmentation. Axillary Vein: No evidence of thrombus. Normal compressibility, respiratory phasicity and response to augmentation. Cephalic Vein: No evidence of thrombus. Normal compressibility, respiratory phasicity and response to augmentation. Basilic Vein: No evidence of thrombus. Normal compressibility, respiratory phasicity and response to augmentation. Brachial Veins: No evidence of thrombus. Normal compressibility, respiratory phasicity  and response to augmentation. Radial Veins: No evidence of thrombus. Normal compressibility, respiratory phasicity and response to augmentation. Ulnar Veins: No evidence of thrombus. Normal compressibility, respiratory phasicity and response to augmentation. Venous Reflux:  None visualized. Other Findings: Subcutaneous edema noted in the forearm area without discrete fluid collection/abscess. IMPRESSION: No evidence of DVT within the left upper extremity. Electronically Signed   By: Marijo Sanes M.D.   On: 01/20/2020 05:42   DG Chest Portable 1 View  Result Date: 01/20/2020 CLINICAL DATA:  Shortness of breath and dizziness. EXAM: PORTABLE CHEST 1 VIEW COMPARISON:  Chest x-ray 11/06/2019 FINDINGS: The cardiac silhouette, mediastinal and hilar contours are within normal limits and stable. Stable tortuosity and calcification of the thoracic aorta. Chronic underlying emphysematous changes and pulmonary scarring. Persistent right upper lobe density. No definite pleural effusions or pulmonary edema. The bony thorax is intact. IMPRESSION: 1. Chronic underlying emphysematous changes and pulmonary scarring. 2. Stable right upper lobe density. 3. No infiltrates, edema or effusions. Electronically Signed   By: Marijo Sanes M.D.   On: 01/20/2020 05:16     EKG: Independently reviewed.  Sinus rhythm, QTC 488, LAE, low voltage, nonspecific T wave change   Assessment/Plan Principal Problem:   Cellulitis of  left arm Active Problems:   Moderate COPD (chronic obstructive pulmonary disease) (HCC)   HTN (hypertension)   Iron deficiency anemia secondary to blood loss (chronic)   Anemia of chronic kidney failure, stage 4 (severe) (HCC)   GERD (gastroesophageal reflux disease)   Depression   CKD (chronic kidney disease), stage IV (HCC)   Hypokalemia   Elevated troponin   Sepsis (HCC)   Chronic diastolic CHF (congestive heart failure) ([place])   Sepsis due to cellulitis of left arm: LUE doppler is negative for DVT.  Patient meets criteria for sepsis with leukocytosis, tachycardia and tachypnea.  Pending lactic acid level.  Currently hemodynamically stable - will place on tele bed for obs - Empiric antimicrobial treatment with Rocephin - PRN Zofran for nausea,and Percocet for pain - Blood cultures x 2  - ESR and CRP - will get Procalcitonin and trend lactic acid levels per sepsis protocol.  Moderate COPD (chronic obstructive pulmonary disease) ([place]): stable -Bronchodilators  HTN:  -Continue home medications: coreg -hydralazine prn  Iron deficiency anemia secondary to blood loss and anemia of chronic kidney failure, stage IV: Hgb 10. 1 on 01/07/20 -->9.4/ -f/u by CBC -Continue iron supplement  GERD (gastroesophageal reflux disease): -protonix and Carafate  Depression: No suicidal or homicidal ideation -Celexa  CKD (chronic kidney disease), stage IV ([place]): stable.  Baseline creatinine 2.0-2.8.  Her creatinine is 1.83, BUN 31. -f/u BMP  Hypokalemia: K= 3.4 on admission. - Repleted - Check Mg level  Elevated troponin: trop 18. No CP. This is in the setting of CKD-IV. Very low suspicion for ACS. -check A1c, FLP -trop trop -repeat EKG in AM.  Chronic diastolic CHF (congestive heart failure) ([place]): 2D echo on 11/07/2019 showed EF 50-55%.  Patient has elevated BNP 603, but no any leg edema.  Chest x-ray has no pulmonary edema.  dCHF seems to be compensated. -hold torsemide  Dizziness  and tingling feeling: Etiology is not clear.  Symptoms are bilateral, low suspicions for stroke.  CT head is negative for acute intracranial abnormalities.  No focal neurologic deficit on physical examination.  May be due to dehydration. Will give gentle IV fluid.  If symptoms does not improve --> then  may consider MRI for brain. -NS 50 cc/h -PT/OT -hold torsemid  now     DVT ppx: SQ Lovenox Code Status: Full code Family Communication: Yes, patient's daughter by phone Disposition Plan:  Anticipate discharge back to previous home environment Consults called:  none Admission status: Med-surg bed for obs  Date of Service 01/20/2020    [facility]   If 7PM-7AM, please contact night-coverage [email or web address] 01/20/2020, 8:04 AM

## 2020-01-21 DIAGNOSIS — L03114 Cellulitis of left upper limb: Secondary | ICD-10-CM

## 2020-01-21 LAB — LIPID PANEL
Cholesterol: 152 mg/dL (ref 0–200)
HDL: 87 mg/dL (ref >40)
LDL Cholesterol: 53 mg/dL (ref 0–99)
Total CHOL/HDL Ratio: 1.7 RATIO
Triglycerides: 60 mg/dL (ref <150)
VLDL: 12 mg/dL (ref 0–40)

## 2020-01-21 LAB — BASIC METABOLIC PANEL
Anion gap: 8 (ref 5–15)
BUN: 26 mg/dL — ABNORMAL HIGH (ref 8–23)
CO2: 22 mmol/L (ref 22–32)
Calcium: 8 mg/dL — ABNORMAL LOW (ref 8.9–10.3)
Chloride: 108 mmol/L (ref 98–111)
Creatinine, Ser: 1.68 mg/dL — ABNORMAL HIGH (ref 0.44–1.00)
GFR calc Af Amer: 37 mL/min — ABNORMAL LOW (ref >60)
GFR calc non Af Amer: 32 mL/min — ABNORMAL LOW (ref >60)
Glucose, Bld: 98 mg/dL (ref 70–99)
Potassium: 3.4 mmol/L — ABNORMAL LOW (ref 3.5–5.1)
Sodium: 138 mmol/L (ref 135–145)

## 2020-01-21 LAB — CBC
HCT: 22.8 % — ABNORMAL LOW (ref 36.0–46.0)
Hemoglobin: 7.8 g/dL — ABNORMAL LOW (ref 12.0–15.0)
MCH: 31 pg (ref 26.0–34.0)
MCHC: 34.2 g/dL (ref 30.0–36.0)
MCV: 90.5 fL (ref 80.0–100.0)
Platelets: 133 10*3/uL — ABNORMAL LOW (ref 150–400)
RBC: 2.52 MIL/uL — ABNORMAL LOW (ref 3.87–5.11)
RDW: 15.3 % (ref 11.5–15.5)
WBC: 8.6 10*3/uL (ref 4.0–10.5)
nRBC: 0.2 % (ref 0.0–0.2)

## 2020-01-21 LAB — MAGNESIUM: Magnesium: 1.5 mg/dL — ABNORMAL LOW (ref 1.7–2.4)

## 2020-01-21 IMAGING — CT CT ABD-PELV W/ CM
2 of 5 series · 16 of 46 positions shown, 18 images · IV contrast (APPLIED)
Comparison: 08/12/2018

CLINICAL DATA: Lower abdominal pain starting today. Vomiting.
History of COPD on home oxygen as needed.

EXAM:
CT ABDOMEN AND PELVIS WITH CONTRAST
TECHNIQUE: Multidetector CT imaging of the abdomen and pelvis was performed
using the standard protocol following bolus administration of
intravenous contrast.
CONTRAST:  75mL OMNIPAQUE IOHEXOL 300 MG/ML  SOLN

[Series 5: coronal st · coronal · 0.68mm/px · 3 of 83 slices shown]
[im 28/83  soft-tissue]
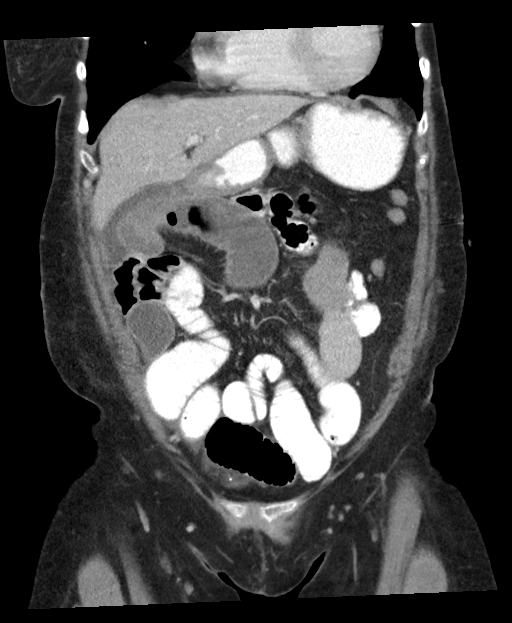
[im 37/83  soft-tissue]
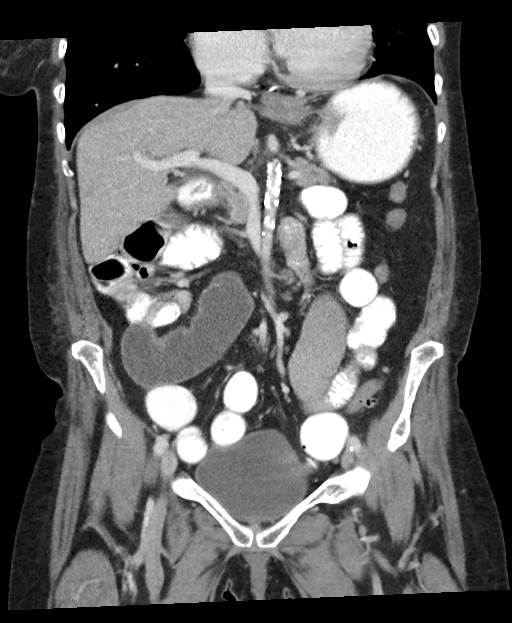
[im 46/83  soft-tissue]
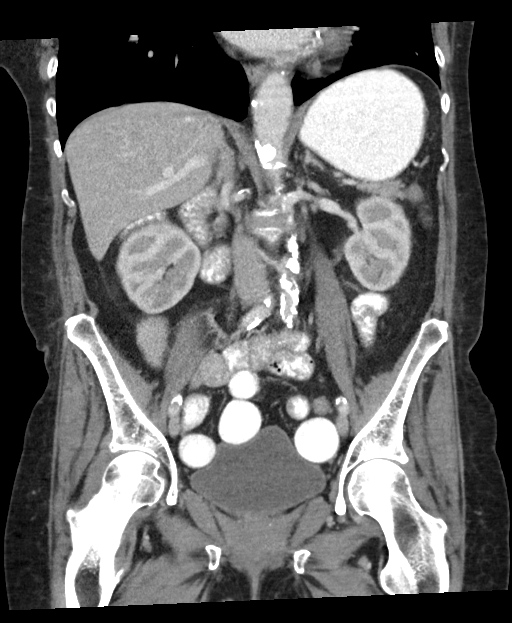

[Series 8: routine abd/pel with---- · axial · 0.65mm/px · z∈[-863,-523]mm · 13 of 77 slices shown, 15 images]
[im 5/77  soft-tissue]
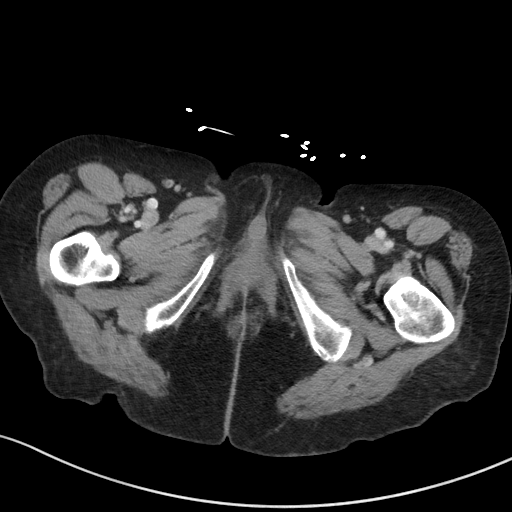
[im 5/77  bone]
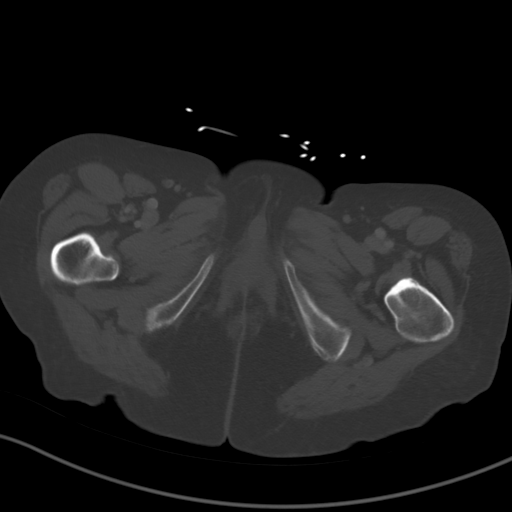
[im 13/77  soft-tissue]
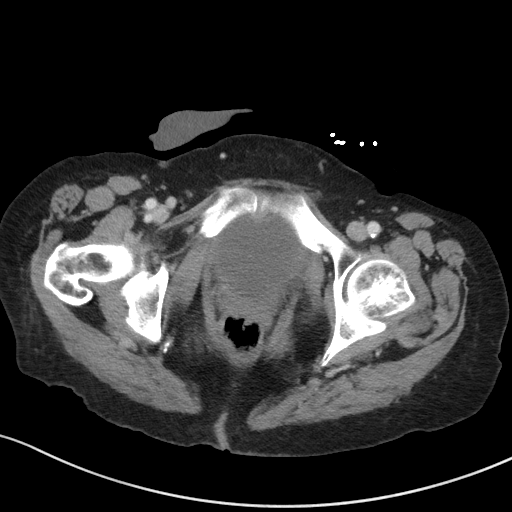
[im 17/77  soft-tissue]
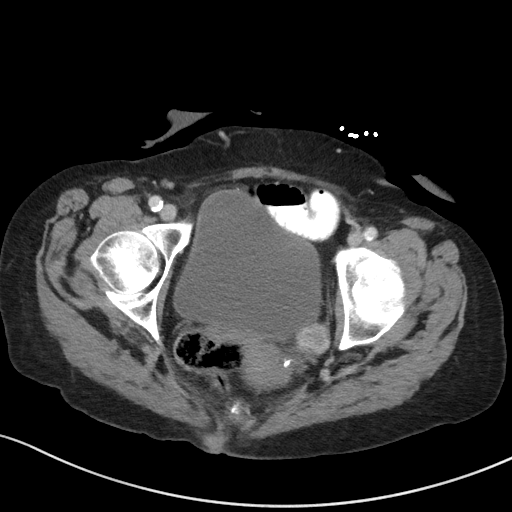
[im 21/77  soft-tissue]
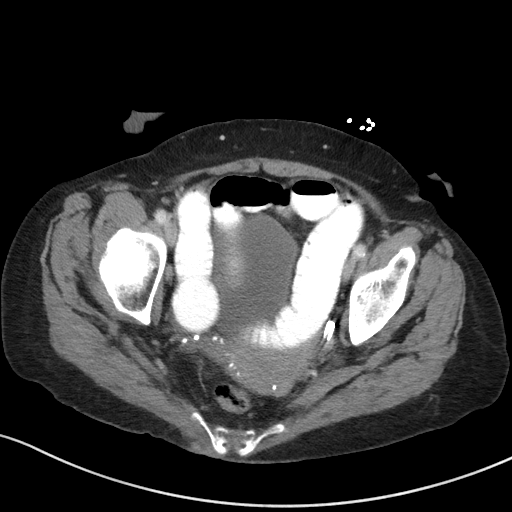
[im 29/77  soft-tissue]
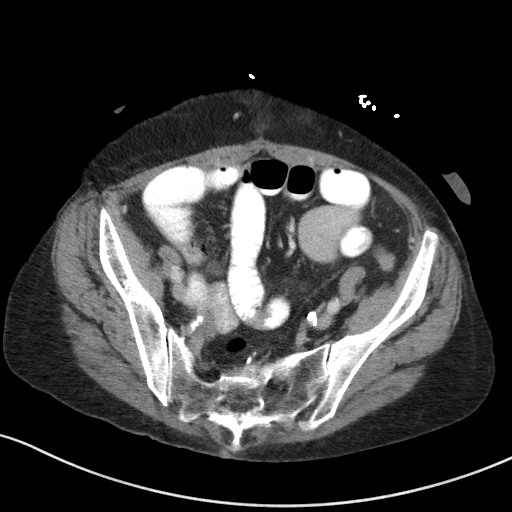
[im 33/77  soft-tissue]
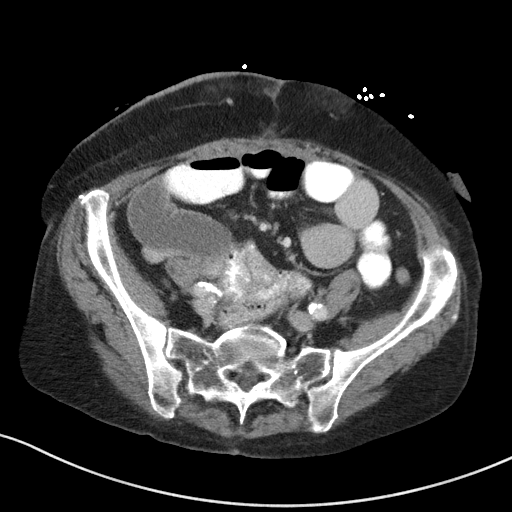
[im 41/77  soft-tissue]
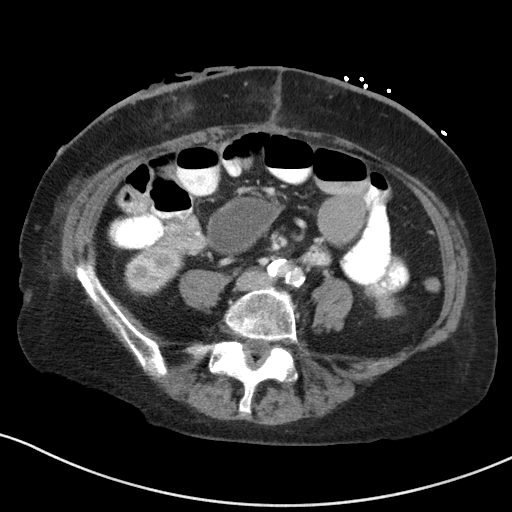
[im 45/77  soft-tissue]
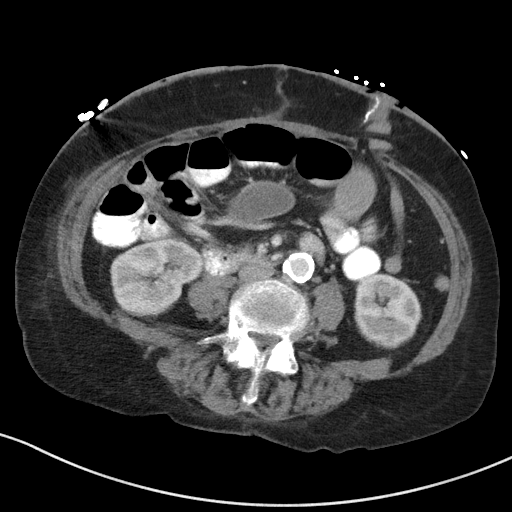
[im 49/77  soft-tissue]
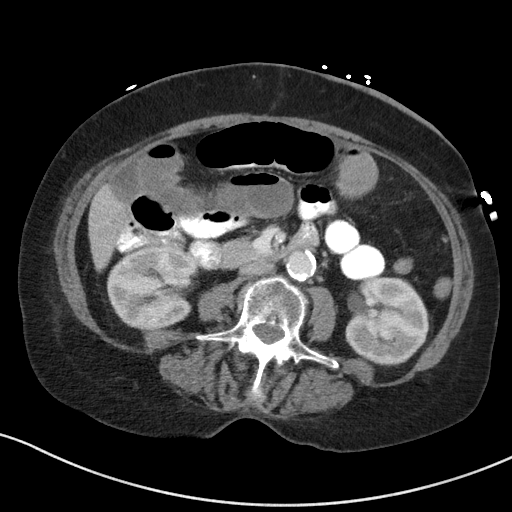
[im 49/77  bone]
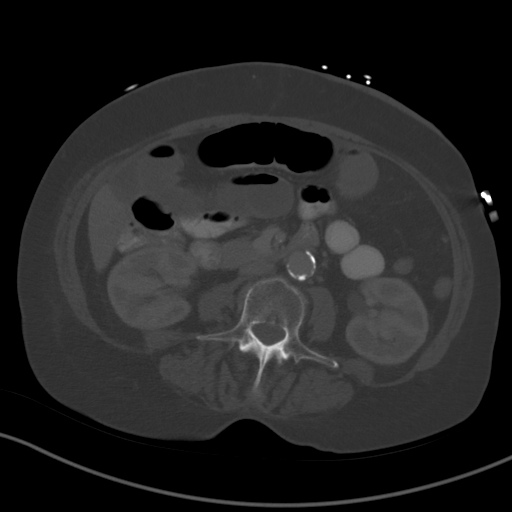
[im 57/77  soft-tissue]
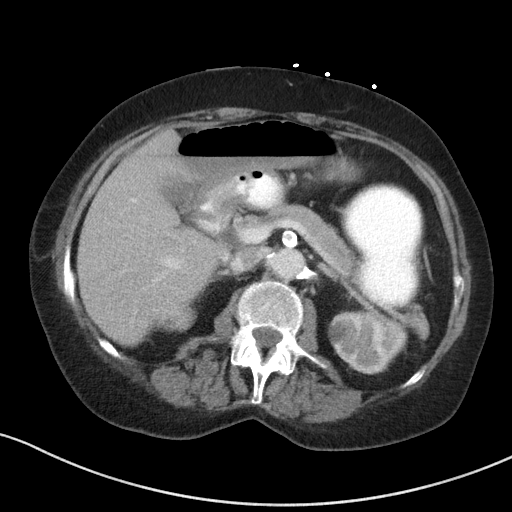
[im 61/77  soft-tissue]
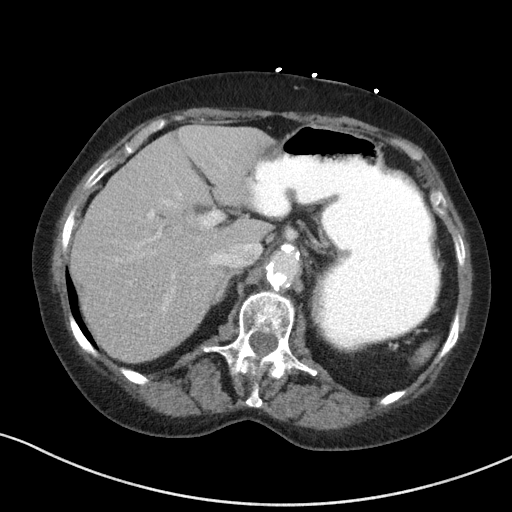
[im 65/77  soft-tissue]
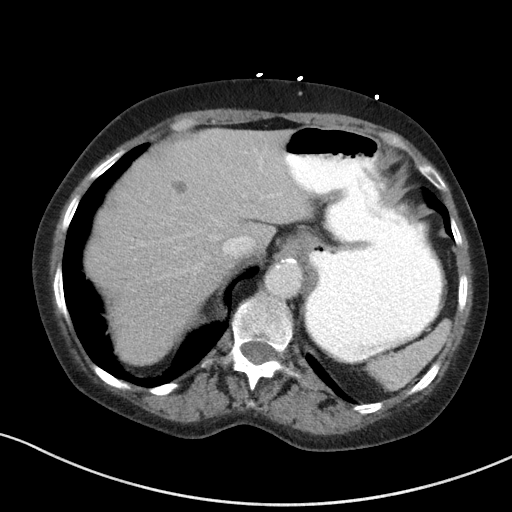
[im 73/77  soft-tissue]
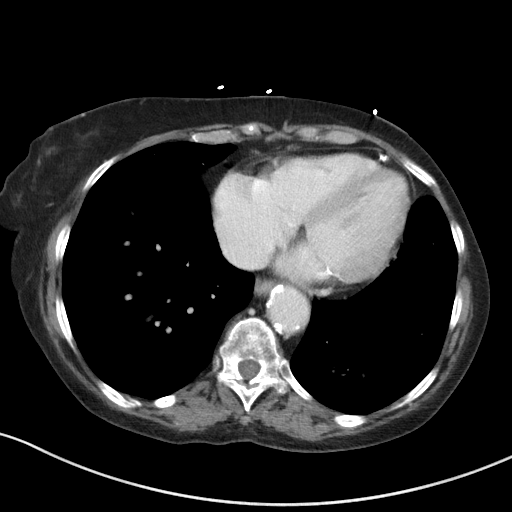

[16 of 46 positions shown; findings below may reference images not displayed]

FINDINGS: Lower chest: Lung bases are clear.

Hepatobiliary: 2 small cysts in the liver. No other focal lesions.
Gallbladder and bile ducts are unremarkable.

Pancreas: Unremarkable. No pancreatic ductal dilatation or
surrounding inflammatory changes.

Spleen: Normal in size without focal abnormality.

Adrenals/Urinary Tract: Adrenal glands are unremarkable. Kidneys are
normal, without renal calculi, focal lesion, or hydronephrosis.
Bladder is unremarkable.

Stomach/Bowel: Small bowel are dilated and fluid-filled all the way
to a right lower quadrant ileostomy. No transition zone is
identified. This could represent early obstruction possibly at the
level of the ileostomy, stasis, or enteritis. Left lower quadrant
colostomy with decompressed colon. Probable right hemicolectomy.

Vascular/Lymphatic: Extensive vascular calcifications around the
aorta and abdominal branch vessels. Prominent calcification in the
superior mesenteric artery likely represents areas of stenosis
although the vessel appears patent.

Reproductive: Uterus and ovaries are not enlarged. Hyperdense lesion
arising from the left uterine fundus may represent exophytic
fibroid.

Other: No free air or free fluid in the abdomen. Scarring in the
anterior abdominal wall is likely postoperative.

Musculoskeletal: Mild anterior compression of L4 without change.
IMPRESSION: 1. Small bowel are dilated and fluid-filled proximal to a right
lower quadrant ileostomy. No transition zone is identified. This
could represent early obstruction at the level of the ileostomy,
stasis, or enteritis.
2. Extensive vascular calcifications. Prominent calcification in the
superior mesenteric artery likely represents areas of stenosis
although the vessel appears patent.
3. Mildly hyperdense lesion arising from the left uterine fundus may
represent exophytic fibroid.

## 2020-01-21 MED ORDER — LOPERAMIDE HCL 2 MG PO CAPS
2.0000 mg | ORAL_CAPSULE | Freq: Three times a day (TID) | ORAL | Status: DC
  Administered 2020-01-21 (×2): 2 mg via ORAL
  Filled 2020-01-21: qty 1

## 2020-01-21 MED ORDER — CEPHALEXIN 500 MG PO CAPS: 500.0000 mg | ORAL_CAPSULE | Freq: Three times a day (TID) | ORAL | 0 refills | Status: AC

## 2020-01-21 NOTE — Care Management Obs Status (Signed)
[place] NOTIFICATION   Patient Details  Name: Danielle [name] MRN: [id number] Date of Birth: 1956/05/12   Medicare Observation Status Notification Given:  Yes    Candie Chroman, LCSW 01/21/2020, 10:09 AM

## 2020-01-21 NOTE — Discharge Summary (Addendum)
Physician Discharge Summary  Danielle [name] ZJI:[id number] DOB: 02-09-1956 DOA: 01/20/2020  PCP: [facility] [place] date: 01/20/2020 Discharge date: 01/21/2020  Admitted From: Home  Disposition:  Home   Recommendations for Outpatient Follow-up:  1. Follow up with PCP in 1 weeks 2. Please obtain BMP/CBC in one week   Home Health: None  Equipment/Devices: None  Discharge Condition: Good  CODE STATUS: FULL Diet recommendation: Cardiac  Brief/Interim Summary: Danielle [name] is a 63 y.o. female with medical history significant of hypertension, COPD on as needed oxygen at home, GERD, depression, left breast cancer (s/p of chemotherapy, radiation therapy and mastectomy), dCHF, right lung cancer (s/pf radiation therapy), ileostomy, anemia, former smoker, CKD stage IV, s/p of ileostomy, COVID-19 infection 09/2019, who presents with left arm pain.  Pt states that her left arm pain started yesterday morning, which is constant, sharp, 7 out of 10 severity, nonradiating.  The left forearm is also has swelling, erythematous and warm.  Denies fever or chills. She has chronic mild cough and shortness of breath due to COPD intermittently, denies chest pain. No nausea, vomiting, diarrhea, abdominal pain, symptoms of UTI. Patient also reports dizziness, and tingling from head to toe on both sides.  No unilateral weakness in extremities.  No facial droop or slurred speech.  In the ER, WBC 13K, low grade fever.  LUE negative for DVT.     PRINCIPAL HOSPITAL DIAGNOSIS: Cellulitis    Discharge Diagnoses:   Cellulitis of left arm:  Sepsis ruled out.  LUE doppler is negative for DVT.   Treated with IV ceftriaxone for 2 doses, arm improving well.  Blood cultures negative.  Discharged to complete 7 more days cephalexin.  Close PCP follow up.  Moderate COPD (chronic obstructive pulmonary disease) ([place]):  HTN:  Continue Coreg   Iron deficiency anemia secondary to blood loss and  anemia of chronic kidney failure, stage IV: Hgb 10. 1 on 01/07/20 -->9.4/  GERD (gastroesophageal reflux disease):  Depression:   CKD (chronic kidney disease), stage IV ([place]):  Stable relative to baseline  Hypokalemia:   Elevated troponin: trop 18. No CP. This is in the setting of CKD-IV. Very low suspicion for ACS.  Chronic diastolic CHF (congestive heart failure) ([place]):  2D echo on 11/07/2019 showed EF 50-55%.  Patient has elevated BNP 603, but no any leg edema.  Chest x-ray has no pulmonary edema.  dCHF seems to be compensated.          Discharge Instructions  Discharge Instructions    Diet - low sodium heart healthy   Complete by: As directed    Discharge instructions   Complete by: As directed    From Dr. Loleta Books: You were admitted for "cellulitis" which means a skin infection, in the arm.  You got better with antibiotics, and so you should continue them for several more days. Take cephalexin 500 mg three times daily for the next week Go see your primary care doctor next week to make sure you are better Return for worsening fever, worsening pain, redness or inability to move the arm or hand   Increase activity slowly   Complete by: As directed      Allergies as of 01/21/2020   No Known Allergies     Medication List    TAKE these medications   acetaminophen 325 MG tablet Commonly known as: TYLENOL Take 2 tablets (650 mg total) by mouth every 6 (six) hours as needed for mild pain (or Fever >/= 101).  albuterol 108 (90 Base) MCG/ACT inhaler Commonly known as: VENTOLIN HFA Inhale 2 puffs into the lungs every 6 (six) hours as needed for wheezing or shortness of breath.   calcium carbonate 500 MG chewable tablet Commonly known as: TUMS - dosed in mg elemental calcium Chew 0.5 tablets (100 mg of elemental calcium total) by mouth daily.   calcium-vitamin D 250-125 MG-UNIT tablet Commonly known as: OSCAL WITH D Take 1 tablet by mouth daily.   carvedilol  6.25 MG tablet Commonly known as: COREG Take 1 tablet (6.25 mg total) by mouth 2 (two) times daily with a meal.   cephALEXin 500 MG capsule Commonly known as: KEFLEX Take 1 capsule (500 mg total) by mouth 3 (three) times daily for 7 days.   citalopram 40 MG tablet Commonly known as: CELEXA Take 40 mg by mouth daily.   feeding supplement (NEPRO CARB STEADY) Liqd Take 237 mLs by mouth 3 (three) times daily between meals.   ferrous sulfate 325 (65 FE) MG tablet Take 1 tablet (325 mg total) by mouth 2 (two) times daily with a meal.   Fluticasone-Umeclidin-Vilant 100-62.5-25 MCG/INH Aepb Inhale 1 puff into the lungs daily.   guaiFENesin-dextromethorphan 100-10 MG/5ML syrup Commonly known as: ROBITUSSIN DM Take 5 mLs by mouth every 4 (four) hours as needed for cough.   loperamide 2 MG tablet Commonly known as: Imodium A-D Take 1 tablet (2 mg total) by mouth as needed for diarrhea or loose stools.   multivitamin with minerals Tabs tablet Take 1 tablet by mouth daily.   pantoprazole 40 MG tablet Commonly known as: Protonix Take 1 tablet (40 mg total) by mouth 2 (two) times daily before a meal.   sodium bicarbonate 650 MG tablet Take 2 tablets (1,300 mg total) by mouth 3 (three) times daily.   sucralfate 1 GM/10ML suspension Commonly known as: CARAFATE Take 10 mLs (1 g total) by mouth 4 (four) times daily.   torsemide 20 MG tablet Commonly known as: DEMADEX Take 1 tablet by mouth daily as needed (swelling).      Follow-up [facility]. Go on 01/29/2020.   Specialty: General Practice Why: 10:40am Contact information: [place] [id number] [phone]          No Known Allergies  Consultations:     Procedures/Studies: CT Head Wo Contrast  Result Date: 01/20/2020 CLINICAL DATA:  Central vertigo. EXAM: CT HEAD WITHOUT CONTRAST TECHNIQUE: Contiguous axial images were obtained from the base of the skull  through the vertex without intravenous contrast. COMPARISON:  05/15/2018 FINDINGS: Brain: No evidence of acute infarction, hemorrhage, hydrocephalus, extra-axial collection or mass lesion/mass effect. Vascular: No hyperdense vessel or unexpected calcification. Skull: Normal. Negative for fracture or focal lesion. Sinuses/Orbits: No acute finding. IMPRESSION: Negative head CT. Electronically Signed   By: Monte Fantasia M.D.   On: 01/20/2020 06:45   US Venous Img Upper Uni Left  Result Date: 01/20/2020 CLINICAL DATA:  Left upper extremity pain and swelling. EXAM: Left UPPER EXTREMITY VENOUS DOPPLER ULTRASOUND TECHNIQUE: Gray-scale sonography with graded compression, as well as color Doppler and duplex ultrasound were performed to evaluate the upper extremity deep venous system from the level of the subclavian vein and including the jugular, axillary, basilic, radial, ulnar and upper cephalic vein. Spectral Doppler was utilized to evaluate flow at rest and with distal augmentation maneuvers. COMPARISON:  None. FINDINGS: Contralateral Subclavian Vein: Respiratory phasicity is normal and symmetric with the symptomatic side. No evidence of thrombus.  Normal compressibility. Internal Jugular Vein: No evidence of thrombus. Normal compressibility, respiratory phasicity and response to augmentation. Subclavian Vein: No evidence of thrombus. Normal compressibility, respiratory phasicity and response to augmentation. Axillary Vein: No evidence of thrombus. Normal compressibility, respiratory phasicity and response to augmentation. Cephalic Vein: No evidence of thrombus. Normal compressibility, respiratory phasicity and response to augmentation. Basilic Vein: No evidence of thrombus. Normal compressibility, respiratory phasicity and response to augmentation. Brachial Veins: No evidence of thrombus. Normal compressibility, respiratory phasicity and response to augmentation. Radial Veins: No evidence of thrombus. Normal  compressibility, respiratory phasicity and response to augmentation. Ulnar Veins: No evidence of thrombus. Normal compressibility, respiratory phasicity and response to augmentation. Venous Reflux:  None visualized. Other Findings: Subcutaneous edema noted in the forearm area without discrete fluid collection/abscess. IMPRESSION: No evidence of DVT within the left upper extremity. Electronically Signed   By: Marijo Sanes M.D.   On: 01/20/2020 05:42   DG Chest Portable 1 View  Result Date: 01/20/2020 CLINICAL DATA:  Shortness of breath and dizziness. EXAM: PORTABLE CHEST 1 VIEW COMPARISON:  Chest x-ray 11/06/2019 FINDINGS: The cardiac silhouette, mediastinal and hilar contours are within normal limits and stable. Stable tortuosity and calcification of the thoracic aorta. Chronic underlying emphysematous changes and pulmonary scarring. Persistent right upper lobe density. No definite pleural effusions or pulmonary edema. The bony thorax is intact. IMPRESSION: 1. Chronic underlying emphysematous changes and pulmonary scarring. 2. Stable right upper lobe density. 3. No infiltrates, edema or effusions. Electronically Signed   By: Marijo Sanes M.D.   On: 01/20/2020 05:16       Subjective: Patient feeling better.  No fever overnight.  No confusion.  Arm swelling is better.  Discharge Exam: Vitals:   01/21/20 0936 01/21/20 1211  BP: 118/70 110/76  Pulse: 94 77  Resp:  15  Temp: 97.9 F (36.6 C) 97.7 F (36.5 C)  SpO2: 100% 99%   Vitals:   01/21/20 0301 01/21/20 0410 01/21/20 0936 01/21/20 1211  BP:  114/75 118/70 110/76  Pulse:  84 94 77  Resp:  20  15  Temp:  98.3 F (36.8 C) 97.9 F (36.6 C) 97.7 F (36.5 C)  TempSrc:  Oral Axillary Oral  SpO2:  96% 100% 99%  Weight: 44.3 kg     Height:        General: Pt is alert, awake, not in acute distress Cardiovascular: RRR, nl S1-S2, no murmurs appreciated.   No LE edema.   Respiratory: Normal respiratory rate and rhythm.  CTAB without  rales or wheezes. Abdominal: Abdomen soft and non-tender.  No distension or HSM.   Neuro/Psych: Strength symmetric in upper and lower extremities.  Judgment and insight appear normal. MSK: Left arm still swollen but better, no more redness, no warmth, mild edema only.   The results of significant diagnostics from this hospitalization (including imaging, microbiology, ancillary and laboratory) are listed below for reference.     Microbiology: Recent Results (from the past 240 hour(s))  SARS CORONAVIRUS 2 (TAT 6-24 HRS) Nasopharyngeal     Status: None   Collection Time: 01/20/20  8:21 AM   Specimen: Nasopharyngeal  Result Value Ref Range Status   SARS Coronavirus 2 NEGATIVE NEGATIVE Final    Comment: (NOTE) SARS-CoV-2 target nucleic acids are NOT DETECTED. The SARS-CoV-2 RNA is generally detectable in upper and lower respiratory specimens during the acute phase of infection. Negative results do not preclude SARS-CoV-2 infection, do not rule out co-infections with other pathogens, and should not be used  as the sole basis for treatment or other patient management decisions. Negative results must be combined with clinical observations, patient history, and epidemiological information. The expected result is Negative. Fact Sheet for Patients: SugarRoll.be Fact Sheet for Healthcare Providers: https://www.woods-mathews.com/ This test is not yet approved or cleared by the [place] FDA and  has been authorized for detection and/or diagnosis of SARS-CoV-2 by FDA under an Emergency Use Authorization (EUA). This EUA will remain  in effect (meaning this test can be used) for the duration of the COVID-19 declaration under Section 56 4(b)(1) of the Act, 21 U.S.C. section 360bbb-3(b)(1), unless the authorization is terminated or revoked sooner. Performed at [facility], [place] [address] [place], [place] [address]   Culture, blood (routine x 2)      Status: None (Preliminary result)   Collection Time: 01/20/20  8:21 AM   Specimen: BLOOD  Result Value Ref Range Status   Specimen Description BLOOD BLOOD RIGHT HAND  Final   Special Requests   Final    BOTTLES DRAWN AEROBIC AND ANAEROBIC Blood Culture adequate volume   Culture   Final    NO GROWTH < 24 HOURS Performed at [facility], [address] [place], [place] [address]    Report Status PENDING  Incomplete  Culture, blood (routine x 2)     Status: None (Preliminary result)   Collection Time: 01/20/20  8:21 AM   Specimen: BLOOD  Result Value Ref Range Status   Specimen Description BLOOD BLOOD RIGHT FOREARM  Final   Special Requests   Final    BOTTLES DRAWN AEROBIC AND ANAEROBIC Blood Culture results may not be optimal due to an inadequate volume of blood received in culture bottles   Culture   Final    NO GROWTH < 24 HOURS Performed at [facility], [address] [place], [place] [address]    Report Status PENDING  Incomplete     Labs: BNP (last 3 results) Recent Labs    10/29/19 1923 11/06/19 1405 01/20/20 0502  BNP 85.0 1,500.0* 132.4*   Basic Metabolic Panel: Recent Labs  Lab 01/20/20 0502 01/21/20 0443  NA 133* 138  K 3.4* 3.4*  CL 103 108  CO2 18* 22  GLUCOSE 92 98  BUN 31* 26*  CREATININE 1.83* 1.68*  CALCIUM 8.3* 8.0*  MG  --  1.5*   Liver Function Tests: Recent Labs  Lab 01/20/20 0502  AST 40  ALT 22  ALKPHOS 67  BILITOT 1.2  PROT 7.3  ALBUMIN 3.6   No results for input(s): LIPASE, AMYLASE in the last 168 hours. No results for input(s): AMMONIA in the last 168 hours. CBC: Recent Labs  Lab 01/20/20 0502 01/21/20 0443  WBC 13.3* 8.6  HGB 9.4* 7.8*  HCT 27.7* 22.8*  MCV 89.9 90.5  PLT 157 133*   Cardiac Enzymes: No results for input(s): CKTOTAL, CKMB, CKMBINDEX, TROPONINI in the last 168 hours. BNP: Invalid input(s): POCBNP CBG: No results for input(s): GLUCAP in the last 168 hours. D-Dimer No  results for input(s): DDIMER in the last 72 hours. Hgb A1c No results for input(s): HGBA1C in the last 72 hours. Lipid Profile Recent Labs    01/21/20 0443  CHOL 152  HDL 87  LDLCALC 53  TRIG 60  CHOLHDL 1.7   Thyroid function studies No results for input(s): TSH, T4TOTAL, T3FREE, THYROIDAB in the last 72 hours.  Invalid input(s): FREET3 Anemia work up No results for input(s): VITAMINB12, FOLATE, FERRITIN, TIBC,  IRON, RETICCTPCT in the last 72 hours. Urinalysis    Component Value Date/Time   COLORURINE YELLOW (A) 01/20/2020 0821   APPEARANCEUR CLEAR (A) 01/20/2020 0821   APPEARANCEUR Clear 12/06/2013 2245   LABSPEC 1.009 01/20/2020 0821   LABSPEC 1.015 12/06/2013 2245   PHURINE 5.0 01/20/2020 0821   GLUCOSEU NEGATIVE 01/20/2020 0821   GLUCOSEU Negative 12/06/2013 2245   HGBUR NEGATIVE 01/20/2020 0821   BILIRUBINUR NEGATIVE 01/20/2020 0821   BILIRUBINUR Negative 12/06/2013 2245   KETONESUR 5 (A) 01/20/2020 0821   PROTEINUR NEGATIVE 01/20/2020 0821   NITRITE NEGATIVE 01/20/2020 0821   LEUKOCYTESUR NEGATIVE 01/20/2020 0821   LEUKOCYTESUR Negative 12/06/2013 2245   Sepsis Labs Invalid input(s): PROCALCITONIN,  WBC,  LACTICIDVEN Microbiology Recent Results (from the past 240 hour(s))  SARS CORONAVIRUS 2 (TAT 6-24 HRS) Nasopharyngeal     Status: None   Collection Time: 01/20/20  8:21 AM   Specimen: Nasopharyngeal  Result Value Ref Range Status   SARS Coronavirus 2 NEGATIVE NEGATIVE Final    Comment: (NOTE) SARS-CoV-2 target nucleic acids are NOT DETECTED. The SARS-CoV-2 RNA is generally detectable in upper and lower respiratory specimens during the acute phase of infection. Negative results do not preclude SARS-CoV-2 infection, do not rule out co-infections with other pathogens, and should not be used as the sole basis for treatment or other patient management decisions. Negative results must be combined with clinical observations, patient history, and  epidemiological information. The expected result is Negative. Fact Sheet for Patients: SugarRoll.be Fact Sheet for Healthcare Providers: https://www.woods-mathews.com/ This test is not yet approved or cleared by the [place] FDA and  has been authorized for detection and/or diagnosis of SARS-CoV-2 by FDA under an Emergency Use Authorization (EUA). This EUA will remain  in effect (meaning this test can be used) for the duration of the COVID-19 declaration under Section 56 4(b)(1) of the Act, 21 U.S.C. section 360bbb-3(b)(1), unless the authorization is terminated or revoked sooner. Performed at [facility], [place] [address], [place], [place] [address]   Culture, blood (routine x 2)     Status: None (Preliminary result)   Collection Time: 01/20/20  8:21 AM   Specimen: BLOOD  Result Value Ref Range Status   Specimen Description BLOOD BLOOD RIGHT HAND  Final   Special Requests   Final    BOTTLES DRAWN AEROBIC AND ANAEROBIC Blood Culture adequate volume   Culture   Final    NO GROWTH < 24 HOURS Performed at [facility], [address], [place], [place] [address]    Report Status PENDING  Incomplete  Culture, blood (routine x 2)     Status: None (Preliminary result)   Collection Time: 01/20/20  8:21 AM   Specimen: BLOOD  Result Value Ref Range Status   Specimen Description BLOOD BLOOD RIGHT FOREARM  Final   Special Requests   Final    BOTTLES DRAWN AEROBIC AND ANAEROBIC Blood Culture results may not be optimal due to an inadequate volume of blood received in culture bottles   Culture   Final    NO GROWTH < 24 HOURS Performed at [facility], [address] [place], [place] [address]    Report Status PENDING  Incomplete     Time coordinating discharge: 25 minutes The [place] controlled substances registry was reviewed for this patient       SIGNED:   Edwin Dada, MD  [facility] 01/21/2020, 5:33 PM

## 2020-01-21 NOTE — Progress Notes (Signed)
Initial Nutrition Assessment  DOCUMENTATION CODES:   Severe malnutrition in context of chronic illness  INTERVENTION:   Nepro Shake po TID, each supplement provides 425 kcal and 19 grams protein  Magic cup TID with meals, each supplement provides 290 kcal and 9 grams of protein  MVI daily  Liberalize diet   NUTRITION DIAGNOSIS:   Severe Malnutrition related to chronic illness(breast and lung cancer, COPD, CHF, SBOs) as evidenced by severe fat depletion, severe muscle depletion.  GOAL:   Patient will meet greater than or equal to 90% of their needs  MONITOR:   PO intake, Supplement acceptance, Labs, Weight trends, Skin, I & O's  REASON FOR ASSESSMENT:   Other (Comment)(Low BMI)    ASSESSMENT:   64 y.o. female with medical history significant of hypertension, COPD on as needed oxygen at home, GERD, depression, left breast cancer (s/p of chemotherapy, radiation therapy and mastectomy), dCHF, right lung cancer (s/p radiation therapy), anemia, former smoker, CKD stage IV, s/p of ileostomy, COVID-19 infection 09/2019, who presents with left arm pain.   Pt is well known to this RD from multiple previous admits. Pt was hospitalized during multiple admissions from July-November of 2019 with radiation pneumonitis and SBO where she required prolonged TPN that was complicated by CHF. Pt developed malnutrition during that admit where she lost down from ~135lbs to ~119lbs. Pt had regained back some of her weight; her UBW is ~125lbs. Pt's admit weight is ~27lbs less than her UBW; RD unsure if admit weight is correct. Pt reports fairly good appetite and oral intake in hospital. Pt is drinking Nepro supplements. RD will liberalize the heart healthy portion of pt's diet as a heart healthy diet is restrictive of protein. Recommend continue supplements and MVI.   Medications reviewed and include: tums, oscal w/ D, celexa, lovenox, ferrous sulfate, MVI, protonix, Na bicarbonate, carafate, NaCl  @50ml /hr, ceftriaxone   Labs reviewed: K 3.4(L), BUN 26(H), creat 1.68(H), Mg 1.5(L) BNP- 603(H)- 4/7 Hgb 7.8(L), Hct 22.8(L)  NUTRITION - FOCUSED PHYSICAL EXAM:    Most Recent Value  Orbital Region  Moderate depletion  Upper Arm Region  Severe depletion  Thoracic and Lumbar Region  Moderate depletion  Buccal Region  Mild depletion  Temple Region  Moderate depletion  Clavicle Bone Region  Moderate depletion  Clavicle and Acromion Bone Region  Moderate depletion  Scapular Bone Region  Moderate depletion  Dorsal Hand  Moderate depletion  Patellar Region  Severe depletion  Anterior Thigh Region  Severe depletion  Posterior Calf Region  Severe depletion  Edema (RD Assessment)  Mild  Hair  Reviewed  Eyes  Reviewed  Mouth  Reviewed  Skin  Reviewed  Nails  Reviewed     Diet Order:   Diet Order            Diet Heart Room service appropriate? Yes; Fluid consistency: Thin  Diet effective now             EDUCATION NEEDS:   Education needs have been addressed  Skin:  Skin Assessment: Reviewed RN Assessment  Last BM:  4/8  Height:   Ht Readings from Last 1 Encounters:  01/20/20 5\' 6"  (1.676 m)    Weight:   Wt Readings from Last 1 Encounters:  01/21/20 44.3 kg    Ideal Body Weight:  59 kg  BMI:  Body mass index is 15.76 kg/m.  Estimated Nutritional Needs:   Kcal:  1600-1800kcal/day  Protein:  80-90g/day  Fluid:  >1.4L/day  [name] MS,  RD, LDN Please refer to [facility] for RD and/or RD on-call/weekend/after hours pager

## 2020-01-21 NOTE — Evaluation (Signed)
Physical Therapy Evaluation Patient Details Name: MARGREAT WIDENER MRN: [id number] DOB: 1956-08-29 Today's Date: 01/21/2020   History of Present Illness  Pt is 64 y/o F here with L UE cellulitis/swelling.  PMH: moderate COPD, BRCA 2000 with L mastectomy, lung CA with radiation tx 2017, ileostomy, anemia, CKD4, CHF.  Clinical Impression  Pt showed good effort with mobility/ambulation/stairs and was safe and near her baseline from a balance/strength perspective.  However he O2 stayed in the 80s during activity and HR slowly crept up into the mid 130s; pt did not feel overly symptomatic during activity.  Pt will not likely need PT once medically ready for d/c, but we will maintain on caseload to further assess cardiovascular/endurance/etc.    Follow Up Recommendations No PT follow up(will maintain on caseload to monitor card-pulm/tolerance)    Equipment Recommendations  None recommended by PT    Recommendations for Other Services       Precautions / Restrictions Precautions Precautions: (low fall risk) Restrictions Weight Bearing Restrictions: No      Mobility  Bed Mobility Overal bed mobility: Independent                Transfers Overall transfer level: Modified independent Equipment used: None Transfers: Sit to/from Stand Sit to Stand: Supervision         General transfer comment: assist with IV/equipment but able to rise on her own w/o physical assist  Ambulation/Gait Ambulation/Gait assistance: Supervision Gait Distance (Feet): 150 Feet Assistive device: None       General Gait Details: Pt able to walk with confidence, no LOBs/safety issues, generally reports she is near baseline.  Her HR did increase to mid 130s and O2 in the mid/upper 80s.  Nursing notified  Stairs Stairs: Yes Stairs assistance: Supervision Stair Management: One rail Right Number of Stairs: 4 General stair comments: Pt was able to negotiate up/down 4 steps w/o assist, definite need of  rail  Wheelchair Mobility    Modified Rankin (Stroke Patients Only)       Balance Overall balance assessment: Modified Independent                                           Pertinent Vitals/Pain Pain Assessment: 0-10 Pain Score: 5  Pain Location: L UE    Home Living Family/patient expects to be discharged to:: Private residence Living Arrangements: Children Available Help at Discharge: Family;Available PRN/intermittently Type of Home: Mobile home Home Access: Stairs to enter Entrance Stairs-Rails: Right Entrance Stairs-Number of Steps: 5 Home Layout: One level Home Equipment: Walker - 2 wheels;Bedside commode;Cane - single point;Shower seat Additional Comments: states she sponge bathes for safety because she has had difficulty getting up off shower seat since ileostomy    Prior Function Level of Independence: Independent         Comments: Pt reports that she has AD for fxl mobility if needed, but does not typically use and she completes all her ADLs I'ly.     Hand Dominance   Dominant Hand: Right    Extremity/Trunk Assessment   Upper Extremity Assessment Upper Extremity Assessment: Defer to OT evaluation    Lower Extremity Assessment Lower Extremity Assessment: Overall WFL for tasks assessed       Communication   Communication: No difficulties  Cognition Arousal/Alertness: Awake/alert Behavior During Therapy: WFL for tasks assessed/performed Overall Cognitive Status: Within Functional Limits for tasks assessed  General Comments General comments (skin integrity, edema, etc.): Pt did relatively well with mobility - reports being near her baseline, showed functional strength (apart from L UE hesitancy)    Exercises     Assessment/Plan    PT Assessment Patient needs continued PT services  PT Problem List Decreased strength;Decreased activity tolerance;Decreased balance;Decreased  mobility;Decreased safety awareness;Cardiopulmonary status limiting activity;Pain       PT Treatment Interventions DME instruction;Gait training;Stair training;Therapeutic activities;Functional mobility training;Balance training;Therapeutic exercise;Neuromuscular re-education;Patient/family education    PT Goals (Current goals can be found in the Care Plan section)  Acute Rehab PT Goals Patient Stated Goal: to get this arm feeling better PT Goal Formulation: With patient Time For Goal Achievement: 02/04/20 Potential to Achieve Goals: Good    Frequency Min 2X/week   Barriers to discharge        Co-evaluation               AM-PAC PT "6 Clicks" Mobility  Outcome Measure Help needed turning from your back to your side while in a flat bed without using bedrails?: None Help needed moving from lying on your back to sitting on the side of a flat bed without using bedrails?: None Help needed moving to and from a bed to a chair (including a wheelchair)?: None Help needed standing up from a chair using your arms (e.g., wheelchair or bedside chair)?: None Help needed to walk in hospital room?: None Help needed climbing 3-5 steps with a railing? : None 6 Click Score: 24    End of Session Equipment Utilized During Treatment: Gait belt Activity Tolerance: Patient tolerated treatment well(pt asymptomatic, but had change in vitals with activity) Patient left: with call bell/phone within reach Nurse Communication: Mobility status PT Visit Diagnosis: Pain;Muscle weakness (generalized) (M62.81);Difficulty in walking, not elsewhere classified (R26.2) Pain - Right/Left: Left Pain - part of body: Arm    Time: 6378-5885 PT Time Calculation (min) (ACUTE ONLY): 19 min   Charges:   PT Evaluation $PT Eval Low Complexity: 1 Low          Kreg Shropshire, DPT 01/21/2020, 11:48 AM

## 2020-01-21 NOTE — Progress Notes (Signed)
Danielle [name]  A and O x 4 VSS. Pt tolerating diet well. No complaints of pain or nausea. IV removed intact, prescriptions given. Pt voices understanding of discharge instructions with no further questions. Pt discharged via wheelchair with axillary.   Allergies as of 01/21/2020   No Known Allergies     Medication List    TAKE these medications   acetaminophen 325 MG tablet Commonly known as: TYLENOL Take 2 tablets (650 mg total) by mouth every 6 (six) hours as needed for mild pain (or Fever >/= 101).   albuterol 108 (90 Base) MCG/ACT inhaler Commonly known as: VENTOLIN HFA Inhale 2 puffs into the lungs every 6 (six) hours as needed for wheezing or shortness of breath.   calcium carbonate 500 MG chewable tablet Commonly known as: TUMS - dosed in mg elemental calcium Chew 0.5 tablets (100 mg of elemental calcium total) by mouth daily.   calcium-vitamin D 250-125 MG-UNIT tablet Commonly known as: OSCAL WITH D Take 1 tablet by mouth daily.   carvedilol 6.25 MG tablet Commonly known as: COREG Take 1 tablet (6.25 mg total) by mouth 2 (two) times daily with a meal.   cephALEXin 500 MG capsule Commonly known as: KEFLEX Take 1 capsule (500 mg total) by mouth 3 (three) times daily for 7 days.   citalopram 40 MG tablet Commonly known as: CELEXA Take 40 mg by mouth daily.   feeding supplement (NEPRO CARB STEADY) Liqd Take 237 mLs by mouth 3 (three) times daily between meals.   ferrous sulfate 325 (65 FE) MG tablet Take 1 tablet (325 mg total) by mouth 2 (two) times daily with a meal.   Fluticasone-Umeclidin-Vilant 100-62.5-25 MCG/INH Aepb Inhale 1 puff into the lungs daily.   guaiFENesin-dextromethorphan 100-10 MG/5ML syrup Commonly known as: ROBITUSSIN DM Take 5 mLs by mouth every 4 (four) hours as needed for cough.   loperamide 2 MG tablet Commonly known as: Imodium A-D Take 1 tablet (2 mg total) by mouth as needed for diarrhea or loose stools.   multivitamin with  minerals Tabs tablet Take 1 tablet by mouth daily.   pantoprazole 40 MG tablet Commonly known as: Protonix Take 1 tablet (40 mg total) by mouth 2 (two) times daily before a meal.   sodium bicarbonate 650 MG tablet Take 2 tablets (1,300 mg total) by mouth 3 (three) times daily.   sucralfate 1 GM/10ML suspension Commonly known as: CARAFATE Take 10 mLs (1 g total) by mouth 4 (four) times daily.   torsemide 20 MG tablet Commonly known as: DEMADEX Take 1 tablet by mouth daily as needed (swelling).       Vitals:   01/21/20 0936 01/21/20 1211  BP: 118/70 110/76  Pulse: 94 77  Resp:  15  Temp: 97.9 F (36.6 C) 97.7 F (36.5 C)  SpO2: 100% 99%    Hisako Bugh Payton Mccallum

## 2020-01-22 LAB — HEMOGLOBIN A1C
Hgb A1c MFr Bld: 5.3 % (ref 4.8–5.6)
Mean Plasma Glucose: 105 mg/dL

## 2020-01-25 LAB — CULTURE, BLOOD (ROUTINE X 2)
Culture: NO GROWTH
Culture: NO GROWTH
Special Requests: ADEQUATE

## 2020-02-04 ENCOUNTER — Other Ambulatory Visit: Payer: Self-pay

## 2020-02-04 ENCOUNTER — Inpatient Hospital Stay: Payer: Medicare Other

## 2020-02-04 ENCOUNTER — Inpatient Hospital Stay: Payer: Medicare Other | Attending: Internal Medicine

## 2020-02-04 VITALS — BP 149/79 | HR 85

## 2020-02-04 DIAGNOSIS — N184 Chronic kidney disease, stage 4 (severe): Secondary | ICD-10-CM | POA: Insufficient documentation

## 2020-02-04 DIAGNOSIS — I129 Hypertensive chronic kidney disease with stage 1 through stage 4 chronic kidney disease, or unspecified chronic kidney disease: Secondary | ICD-10-CM | POA: Insufficient documentation

## 2020-02-04 DIAGNOSIS — D539 Nutritional anemia, unspecified: Secondary | ICD-10-CM

## 2020-02-04 DIAGNOSIS — D631 Anemia in chronic kidney disease: Secondary | ICD-10-CM | POA: Diagnosis not present

## 2020-02-04 DIAGNOSIS — C155 Malignant neoplasm of lower third of esophagus: Secondary | ICD-10-CM

## 2020-02-04 DIAGNOSIS — Z79899 Other long term (current) drug therapy: Secondary | ICD-10-CM | POA: Diagnosis not present

## 2020-02-04 LAB — HEMOGLOBIN: Hemoglobin: 9.7 g/dL — ABNORMAL LOW (ref 12.0–15.0)

## 2020-02-04 LAB — HEMATOCRIT: HCT: 29.9 % — ABNORMAL LOW (ref 36.0–46.0)

## 2020-02-04 MED ORDER — EPOETIN ALFA-EPBX 10000 UNIT/ML IJ SOLN
20000.0000 [IU] | Freq: Once | INTRAMUSCULAR | Status: AC
  Administered 2020-02-04: 20000 [IU] via SUBCUTANEOUS
  Filled 2020-02-04: qty 2

## 2020-03-03 ENCOUNTER — Inpatient Hospital Stay: Payer: Medicare Other | Admitting: Internal Medicine

## 2020-03-03 ENCOUNTER — Inpatient Hospital Stay: Payer: Medicare Other

## 2020-03-04 ENCOUNTER — Telehealth: Payer: Self-pay | Admitting: *Deleted

## 2020-03-04 NOTE — Telephone Encounter (Signed)
Patient called reporting that her Lymph edema is back and asking if she will be ok to wait until Monday about it or if something needs to be done

## 2020-03-07 ENCOUNTER — Other Ambulatory Visit: Payer: Self-pay

## 2020-03-07 ENCOUNTER — Inpatient Hospital Stay: Payer: Medicare Other

## 2020-03-07 ENCOUNTER — Inpatient Hospital Stay (HOSPITAL_BASED_OUTPATIENT_CLINIC_OR_DEPARTMENT_OTHER): Payer: Medicare Other | Admitting: Internal Medicine

## 2020-03-07 ENCOUNTER — Encounter: Payer: Self-pay | Admitting: Internal Medicine

## 2020-03-07 ENCOUNTER — Inpatient Hospital Stay: Payer: Medicare Other | Attending: Internal Medicine

## 2020-03-07 DIAGNOSIS — D631 Anemia in chronic kidney disease: Secondary | ICD-10-CM | POA: Diagnosis not present

## 2020-03-07 DIAGNOSIS — C155 Malignant neoplasm of lower third of esophagus: Secondary | ICD-10-CM | POA: Insufficient documentation

## 2020-03-07 DIAGNOSIS — Z923 Personal history of irradiation: Secondary | ICD-10-CM | POA: Insufficient documentation

## 2020-03-07 DIAGNOSIS — I129 Hypertensive chronic kidney disease with stage 1 through stage 4 chronic kidney disease, or unspecified chronic kidney disease: Secondary | ICD-10-CM | POA: Insufficient documentation

## 2020-03-07 DIAGNOSIS — N184 Chronic kidney disease, stage 4 (severe): Secondary | ICD-10-CM | POA: Diagnosis not present

## 2020-03-07 DIAGNOSIS — D539 Nutritional anemia, unspecified: Secondary | ICD-10-CM

## 2020-03-07 DIAGNOSIS — Z79899 Other long term (current) drug therapy: Secondary | ICD-10-CM | POA: Insufficient documentation

## 2020-03-07 LAB — CBC WITH DIFFERENTIAL/PLATELET
Abs Immature Granulocytes: 0.01 10*3/uL (ref 0.00–0.07)
Basophils Absolute: 0.1 10*3/uL (ref 0.0–0.1)
Basophils Relative: 1 %
Eosinophils Absolute: 0.4 10*3/uL (ref 0.0–0.5)
Eosinophils Relative: 7 %
HCT: 27.5 % — ABNORMAL LOW (ref 36.0–46.0)
Hemoglobin: 9.5 g/dL — ABNORMAL LOW (ref 12.0–15.0)
Immature Granulocytes: 0 %
Lymphocytes Relative: 19 %
Lymphs Abs: 1 10*3/uL (ref 0.7–4.0)
MCH: 33.9 pg (ref 26.0–34.0)
MCHC: 34.5 g/dL (ref 30.0–36.0)
MCV: 98.2 fL (ref 80.0–100.0)
Monocytes Absolute: 0.5 10*3/uL (ref 0.1–1.0)
Monocytes Relative: 9 %
Neutro Abs: 3.6 10*3/uL (ref 1.7–7.7)
Neutrophils Relative %: 64 %
Platelets: 141 10*3/uL — ABNORMAL LOW (ref 150–400)
RBC: 2.8 MIL/uL — ABNORMAL LOW (ref 3.87–5.11)
RDW: 13.3 % (ref 11.5–15.5)
WBC: 5.6 10*3/uL (ref 4.0–10.5)
nRBC: 0 % (ref 0.0–0.2)

## 2020-03-07 LAB — BASIC METABOLIC PANEL
Anion gap: 9 (ref 5–15)
BUN: 25 mg/dL — ABNORMAL HIGH (ref 8–23)
CO2: 22 mmol/L (ref 22–32)
Calcium: 8.8 mg/dL — ABNORMAL LOW (ref 8.9–10.3)
Chloride: 102 mmol/L (ref 98–111)
Creatinine, Ser: 1.83 mg/dL — ABNORMAL HIGH (ref 0.44–1.00)
GFR calc Af Amer: 33 mL/min — ABNORMAL LOW (ref >60)
GFR calc non Af Amer: 28 mL/min — ABNORMAL LOW (ref >60)
Glucose, Bld: 88 mg/dL (ref 70–99)
Potassium: 3.6 mmol/L (ref 3.5–5.1)
Sodium: 133 mmol/L — ABNORMAL LOW (ref 135–145)

## 2020-03-07 MED ORDER — EPOETIN ALFA-EPBX 10000 UNIT/ML IJ SOLN
20000.0000 [IU] | Freq: Once | INTRAMUSCULAR | Status: AC
  Administered 2020-03-07: 20000 [IU] via SUBCUTANEOUS
  Filled 2020-03-07: qty 2

## 2020-03-07 NOTE — Telephone Encounter (Signed)
Pt's concerned addressed in clinic. Referral initiated to lymphaedema clinic.

## 2020-03-07 NOTE — Progress Notes (Signed)
Pt has edema in left lower arm and hand.  States got worse on Friday itching, felt warm and increased pain. States hand is very tender today.

## 2020-03-07 NOTE — Assessment & Plan Note (Addendum)
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  Currently s/p definitive radiation [dec 7th 2020].  STABLE;   Posttreatment PET scan February 2021-no evidence of any metastatic disease.  No uptake in the esophagus region.s/p EGD on 3/30-clinically no overt recurrent malignancy; atypical cells noted [radiation changes versus early malignancy].  For now would recommend repeat EGD as planned in 6 months.  Reviewed the pathology with patient.  #Severe anemia-likely secondary to CKD stage IV-currently on Retacrit; hemoglobin today is 9.3; proceed with Retacrit today.  # Left UE lymphemia/recent cellulitis- s/p antibiotics recommend Lymphedema PT   #CKD stage IV-clinically STABLE.   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 STABLE.   # DISPOSITION: # Lymphdema PT referral ASAP  # RETACRIT today  # in 4 weeks- H&H- possible retacrit  # follow up in 8 weeks- cbc/bmp;iron studies/ferritin-; possible Retacrit;-Dr.B

## 2020-03-07 NOTE — Progress Notes (Signed)
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 A. LUNG MASS, RIGHT; CT-GUIDED BIOPSY: - ADENOCARCINOMA, ACINAR AND PAPILLARY MORPHOLOGIES. S/p RT [finished Dec 2017] s/p RT [DEC 2017]; AUG 2019- PET-right middle lobe radiation changes no evidence of recurrence.  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  T1b- submucosal involvement; N0- stage I [[name]; [facility]]; unable to proceed with Endo mucosal resection-because of extensive disease [Dr.Branch; [facility]]; recommend definitive radiation [finished  RT- 09/21/2019]   #September 2020 worsening macrocytic anemia--bone marrow biopsy shows plasma cells up to 5%; unlikely to explain patient's anemia.  MDS is still a possibility-if anemia worsens would recommend peripheral blood NGS.  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]  # 2019-July-aug 2019-ischemic colitis status post right  hemicolectomy [Dr.[name]]; acute renal failure;  -----------------------------------------------------------------    DIAGNOSIS: RML Lung ca adeno [stage I] #squamous cell carcinoma esophagus -stage I   CURRENT/MOST RECENT THERAPY: Status post radiation       Breast cancer in female [facility])  07/24/1999 Initial Diagnosis   Breast cancer in female ([place]) T3 N1 M0 tumor ER/PR positive   08/24/1999 -  Anti-estrogen oral therapy   Started tamoxifen   08/24/1999 Surgery   Status post modified radical mastectomy of left breast    Chemotherapy      Radiation Therapy     11/24/2003 - 02/20/2009 Anti-estrogen oral therapy   Started Aromasin   Carcinoma of overlapping sites of left breast in female, estrogen receptor positive ([place])  06/22/2016 Initial Diagnosis   Cancer of overlapping sites of left female breast ([place])   Malignant neoplasm of upper lobe of right lung ([place])  Malignant neoplasm of lower third of esophagus ([place])  05/01/2019 Initial Diagnosis   Malignant neoplasm of lower third of esophagus ([place])     INTERVAL HISTORY:  Danielle [name] 64 y.o.  female pleasant patient above history of multiple malignancies including most recent stage I squamous cell cancer of esophagus currently status post radiation; and history of severe anemia is here for follow-up.  In the interim patient was evaluated by GI; s/p endoscopy.  Patient was also admitted to hospital for left upper extremity swelling/cellulitis.  Treat with IV antibiotics.  Swelling is improved not resolved.  Appetite good no weight loss.  No difficulty swallowing.   Review of Systems  Constitutional: Positive for malaise/fatigue and weight loss. Negative for chills, diaphoresis and fever.  HENT: Negative for nosebleeds and sore throat.   Eyes: Negative for double vision.  Respiratory: Positive for cough and shortness of breath. Negative for hemoptysis, sputum production and wheezing.   Cardiovascular: Negative for chest  pain, palpitations, orthopnea and leg swelling.  Gastrointestinal: Negative for abdominal pain, blood in stool, constipation, heartburn, melena and vomiting.  Genitourinary: Negative for dysuria, frequency and urgency.  Musculoskeletal: Negative for back pain and joint pain.  Skin: Negative.  Negative for itching and rash.  Neurological: Negative for tingling, focal weakness, weakness and headaches.  Endo/Heme/Allergies: Does not bruise/bleed easily.  Psychiatric/Behavioral: Negative for depression. The patient is not nervous/anxious and does not have insomnia.       PAST MEDICAL HISTORY :  Past Medical History:  Diagnosis Date  . Anxiety   . Arthritis   . Breast cancer ([place]) 2000  . Cancer ([place]) left    breast cancer 2000, chemo tx's with total mastectomy and lymph nodes resected.   . Cancer of right lung ([place]) 02/21/2016   rad tx's.   . CHF (congestive heart failure) ([name])   . COPD (chronic obstructive pulmonary disease) ([place])   . Dependence on supplemental oxygen   . Depression   . Heart murmur   . Hypertension   . Lung nodule   . Lymphedema   . Personal history of chemotherapy   . Personal history of radiation therapy   . Shortness of breath dyspnea    with exertion  . Status post chemotherapy 2001   left breast cancer  . Status post radiation therapy 2001   left breast cancer    PAST SURGICAL HISTORY :   Past Surgical History:  Procedure Laterality Date  . Breast Biospy Left    [facility]  . BREAST SURGERY    . COLONOSCOPY N/A 04/30/2018   Procedure: COLONOSCOPY;  Surgeon: Virgel Manifold, MD;  Location: [facility] ENDOSCOPY;  Service: Endoscopy;  Laterality: N/A;  . COLONOSCOPY N/A 07/22/2018   Procedure: COLONOSCOPY;  Surgeon: Virgel Manifold, MD;  Location: [facility] ENDOSCOPY;  Service: Endoscopy;  Laterality: N/A;  . DILATION AND CURETTAGE OF UTERUS    . ELECTROMAGNETIC NAVIGATION BROCHOSCOPY Right 04/11/2016   Procedure: ELECTROMAGNETIC NAVIGATION BRONCHOSCOPY;   Surgeon: Vilinda Boehringer, MD;  Location: [facility] ORS;  Service: Cardiopulmonary;  Laterality: Right;  . ESOPHAGOGASTRODUODENOSCOPY N/A 07/22/2018   Procedure: ESOPHAGOGASTRODUODENOSCOPY (EGD);  Surgeon: Virgel Manifold, MD;  Location: [facility] ENDOSCOPY;  Service: Endoscopy;  Laterality: N/A;  . ESOPHAGOGASTRODUODENOSCOPY (EGD) WITH PROPOFOL N/A 05/07/2018   Procedure: ESOPHAGOGASTRODUODENOSCOPY (EGD) WITH PROPOFOL;  Surgeon: Lucilla Lame, MD;  Location: [facility] ENDOSCOPY;  Service: Endoscopy;  Laterality: N/A;  . ESOPHAGOGASTRODUODENOSCOPY (EGD) WITH PROPOFOL N/A 04/24/2019   Procedure: ESOPHAGOGASTRODUODENOSCOPY (EGD) WITH PROPOFOL;  Surgeon: Jonathon Bellows, MD;  Location: [facility] ENDOSCOPY;  Service: Gastroenterology;  Laterality: N/A;  . ESOPHAGOGASTRODUODENOSCOPY (EGD) WITH PROPOFOL N/A 01/12/2020   Procedure: ESOPHAGOGASTRODUODENOSCOPY (EGD) WITH PROPOFOL;  Surgeon: Jonathon Bellows, MD;  Location[facility] ENDOSCOPY;  Service: Gastroenterology;  Laterality: N/A;  . EUS N/A 05/07/2019   Procedure: FULL UPPER ENDOSCOPIC ULTRASOUND (EUS) RADIAL;  Surgeon: Jola Schmidt, MD;  Location: [facility] ENDOSCOPY;  Service: Endoscopy;  Laterality: N/A;  . history of colonoscopy]    . ILEOSCOPY N/A 07/22/2018   Procedure: ILEOSCOPY THROUGH STOMA;  Surgeon: Virgel Manifold, MD;  Location: [facility] ENDOSCOPY;  Service: Endoscopy;  Laterality: N/A;  . ILEOSTOMY    . ILEOSTOMY N/A 09/08/2018   Procedure: ILEOSTOMY REVISION POSSIBLE CREATION;  Surgeon: Herbert Pun, MD;  Location: [facility] ORS;  Service: General;  Laterality: N/A;  . ILEOSTOMY CLOSURE N/A 08/15/2018   Procedure: DILATION OF  ILEOSTOMY STRICTURE;  Surgeon: Herbert Pun, MD;  Location: [facility] ORS;  Service: General;  Laterality: N/A;  . LAPAROTOMY Right 05/04/2018   Procedure: EXPLORATORY LAPAROTOMY right colectomy right and left ostomy;  Surgeon: Herbert Pun, MD;  Location: [facility] ORS;  Service: General;  Laterality: Right;  . LUNG BIOPSY    . MASTECTOMY  Left    2000, [facility]  . ROTATOR CUFF REPAIR Right    [facility]    FAMILY HISTORY :   Family History  Problem Relation Age of Onset  . Breast cancer Mother 46  . Cancer Mother        Breast   . Cirrhosis Father   . Breast cancer Paternal Aunt 46  . Cancer Maternal Aunt        Breast     SOCIAL HISTORY:   Social History   Tobacco Use  . Smoking status: Former Smoker    Packs/day: 0.50    Years: 20.00    Pack years: 10.00    Types: Cigarettes    Quit date: 07/02/2012    Years since quitting: 7.6  . Smokeless tobacco: Current User    Types: Snuff  . Tobacco comment: quit 2014  Substance Use Topics  . Alcohol use: Not Currently    Comment: Occasionally  . Drug use: No    ALLERGIES:  has No Known Allergies.  MEDICATIONS:  Current Outpatient Medications  Medication Sig Dispense Refill  . acetaminophen (TYLENOL) 325 MG tablet Take 2 tablets (650 mg total) by mouth every 6 (six) hours as needed for mild pain (or Fever >/= 101).    [name] albuterol (PROVENTIL HFA;VENTOLIN HFA) 108 (90 Base) MCG/ACT inhaler Inhale 2 puffs into the lungs every 6 (six) hours as needed for wheezing or shortness of breath. 1 Inhaler 2  . amLODipine (NORVASC) 5 MG tablet Take by mouth.    . calcium carbonate (TUMS - DOSED IN MG ELEMENTAL CALCIUM) 500 MG chewable tablet Chew 0.5 tablets (100 mg of elemental calcium total) by mouth daily.    . calcium-vitamin D (OSCAL WITH D) 250-125 MG-UNIT tablet Take 1 tablet by mouth daily.    . carvedilol (COREG) 6.25 MG tablet Take 1 tablet (6.25 mg total) by mouth 2 (two) times daily with a meal. 60 tablet 0  . citalopram (CELEXA) 40 MG tablet Take 40 mg by mouth daily.     . ferrous sulfate 325 (65 FE) MG tablet Take 1 tablet (325 mg total) by mouth 2 (two) times daily with a meal. 60 tablet 3  . Fluticasone-Umeclidin-Vilant 100-62.5-25 MCG/INH AEPB Inhale 1 puff into the lungs daily.     [name] loperamide (IMODIUM A-D) 2 MG tablet Take 1 tablet (2 mg total) by mouth as  needed for diarrhea or loose stools. 30 tablet 0  . Multiple Vitamin (MULTIVITAMIN WITH MINERALS) TABS tablet Take 1 tablet by mouth daily. 30 tablet 1  . Nutritional Supplements (FEEDING SUPPLEMENT, NEPRO CARB STEADY,) LIQD Take 237 mLs by mouth 3 (three) times daily between meals. 21330 mL 0  . pantoprazole (PROTONIX) 40 MG tablet Take 1 tablet (40 mg total) by mouth 2 (two) times daily before a meal. 60 tablet 0  . sodium bicarbonate 650 MG tablet Take 2 tablets (1,300 mg total) by mouth 3 (three) times daily. 180 tablet 0  . sucralfate (CARAFATE) 1 GM/10ML suspension Take 10 mLs (1 g total) by mouth 4 (four) times daily. 1200 mL 2  . guaiFENesin-dextromethorphan (ROBITUSSIN DM) 100-10 MG/5ML syrup Take 5 mLs by  mouth every 4 (four) hours as needed for cough.    . torsemide (DEMADEX) 20 MG tablet Take 1 tablet by mouth daily as needed (swelling).      No current facility-administered medications for this visit.    PHYSICAL EXAMINATION: ECOG PERFORMANCE STATUS: 1 - Symptomatic but completely ambulatory  BP 111/67 (BP Location: Right Arm, Patient Position: Sitting)   Pulse 71   Temp (!) 96.3 F (35.7 C) (Tympanic)   Resp 18   Wt 125 lb 6.4 oz (56.9 kg)   SpO2 100%   BMI 20.24 kg/m   Filed Weights   03/07/20 0958  Weight: 125 lb 6.4 oz (56.9 kg)    Physical Exam  Constitutional: She is oriented to person, place, and time.  Thin built moderately nourished female patient.  She is walking herself.  She is alone.  HENT:  Head: Normocephalic and atraumatic.  Mouth/Throat: Oropharynx is clear and moist. No oropharyngeal exudate.  Eyes: Pupils are equal, round, and reactive to light.  Cardiovascular: Normal rate and regular rhythm.  Pulmonary/Chest: No respiratory distress. She has no wheezes.  Decreased breath sounds bilaterally.  No wheeze or crackles.  Abdominal: Soft. Bowel sounds are normal. She exhibits no distension and no mass. There is no abdominal tenderness. There is no  rebound and no guarding.  Musculoskeletal:        General: No tenderness or edema. Normal range of motion.     Cervical back: Normal range of motion and neck supple.     Comments: Left upper extremity-swollen/no warmth or tenderness.   Neurological: She is alert and oriented to person, place, and time.  Skin: Skin is warm.  Psychiatric: Affect normal.       LABORATORY DATA:  I have reviewed the data as listed    Component Value Date/Time   NA 133 (L) 03/07/2020 0931   NA 132 (L) 02/09/2015 1100   K 3.6 03/07/2020 0931   K 3.8 02/09/2015 1100   CL 102 03/07/2020 0931   CL 95 (L) 02/09/2015 1100   CO2 22 03/07/2020 0931   CO2 29 02/09/2015 1100   GLUCOSE 88 03/07/2020 0931   GLUCOSE 105 (H) 02/09/2015 1100   BUN 25 (H) 03/07/2020 0931   BUN 16 02/09/2015 1100   CREATININE 1.83 (H) 03/07/2020 0931   CREATININE 0.81 02/09/2015 1100   CALCIUM 8.8 (L) 03/07/2020 0931   CALCIUM 9.1 02/09/2015 1100   PROT 7.3 01/20/2020 0502   PROT 7.7 02/09/2015 1100   ALBUMIN 3.6 01/20/2020 0502   ALBUMIN 4.3 02/09/2015 1100   AST 40 01/20/2020 0502   AST 29 02/09/2015 1100   ALT 22 01/20/2020 0502   ALT 20 02/09/2015 1100   ALKPHOS 67 01/20/2020 0502   ALKPHOS 69 02/09/2015 1100   BILITOT 1.2 01/20/2020 0502   BILITOT 0.9 02/09/2015 1100   GFRNONAA 28 (L) 03/07/2020 0931   GFRNONAA >60 02/09/2015 1100   GFRAA 33 (L) 03/07/2020 0931   GFRAA >60 02/09/2015 1100    No results found for: SPEP, UPEP  Lab Results  Component Value Date   WBC 5.6 03/07/2020   NEUTROABS 3.6 03/07/2020   HGB 9.5 (L) 03/07/2020   HCT 27.5 (L) 03/07/2020   MCV 98.2 03/07/2020   PLT 141 (L) 03/07/2020      Chemistry      Component Value Date/Time   NA 133 (L) 03/07/2020 0931   NA 132 (L) 02/09/2015 1100   K 3.6 03/07/2020 0931   K 3.8 02/09/2015  1100   CL 102 03/07/2020 0931   CL 95 (L) 02/09/2015 1100   CO2 22 03/07/2020 0931   CO2 29 02/09/2015 1100   BUN 25 (H) 03/07/2020 0931   BUN 16  02/09/2015 1100   CREATININE 1.83 (H) 03/07/2020 0931   CREATININE 0.81 02/09/2015 1100      Component Value Date/Time   CALCIUM 8.8 (L) 03/07/2020 0931   CALCIUM 9.1 02/09/2015 1100   ALKPHOS 67 01/20/2020 0502   ALKPHOS 69 02/09/2015 1100   AST 40 01/20/2020 0502   AST 29 02/09/2015 1100   ALT 22 01/20/2020 0502   ALT 20 02/09/2015 1100   BILITOT 1.2 01/20/2020 0502   BILITOT 0.9 02/09/2015 1100       RADIOGRAPHIC STUDIES: I have personally reviewed the radiological images as listed and agreed with the findings in the report. No results found.   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  Currently s/p definitive radiation [dec 7th 2020].  STABLE;   Posttreatment PET scan February 2021-no evidence of any metastatic disease.  No uptake in the esophagus region.s/p EGD on 3/30-clinically no overt recurrent malignancy; atypical cells noted [radiation changes versus early malignancy].  For now would recommend repeat EGD as planned in 6 months.  Reviewed the pathology with patient.  #Severe anemia-likely secondary to CKD stage IV-currently on Retacrit; hemoglobin today is 9.3; proceed with Retacrit today.  # Left UE lymphemia/recent cellulitis- s/p antibiotics recommend Lymphedema PT   #CKD stage IV-clinically STABLE.   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 STABLE.   # DISPOSITION: # Lymphdema PT referral ASAP  # RETACRIT today  # in 4 weeks- H&H- possible retacrit  # follow up in 8 weeks- cbc/bmp;iron studies/ferritin-; possible Retacrit;-Dr.B     Orders Placed This Encounter  Procedures  . Hematocrit ([facility])    Standing Status:   Future    Standing Expiration Date:   03/07/2021  . Hemoglobin [facility])    Standing Status:   Future    Standing Expiration  Date:   03/07/2021  . CBC with Differential    Standing Status:   Future    Standing Expiration Date:   03/07/2021  . Basic metabolic panel    Standing Status:   Future    Standing Expiration Date:   03/07/2021  . Ferritin    Standing Status:   Future    Standing Expiration Date:   03/07/2021  . Iron and TIBC    Standing Status:   Future    Standing Expiration Date:   03/07/2021   All questions were answered. The patient knows to call the clinic with any problems, questions or concerns.      [name], MD 03/07/2020 10:58 AM

## 2020-04-04 ENCOUNTER — Inpatient Hospital Stay: Payer: Medicare Other

## 2020-04-04 ENCOUNTER — Inpatient Hospital Stay: Payer: Medicare Other | Attending: Oncology

## 2020-04-04 ENCOUNTER — Other Ambulatory Visit: Payer: Self-pay

## 2020-04-04 DIAGNOSIS — C155 Malignant neoplasm of lower third of esophagus: Secondary | ICD-10-CM | POA: Diagnosis not present

## 2020-04-04 LAB — HEMATOCRIT: HCT: 32.5 % — ABNORMAL LOW (ref 36.0–46.0)

## 2020-04-04 LAB — HEMOGLOBIN: Hemoglobin: 11.2 g/dL — ABNORMAL LOW (ref 12.0–15.0)

## 2020-04-05 IMAGING — CT CT CHEST W/O CM
2 of 3 series · 15 of 36 positions shown, 18 images · non-contrast
Comparison: PET-CT 06/20/2018.  Chest CT 04/21/2018.

CLINICAL DATA: Pulmonary nodule.

EXAM:
CT CHEST WITHOUT CONTRAST
TECHNIQUE: Multidetector CT imaging of the chest was performed following the
standard protocol without IV contrast.

[Series 2: thorax · axial · 0.57mm/px · z∈[-314,-56]mm · 12 of 153 slices shown, 15 images]
[im 12/153  mediastinal]
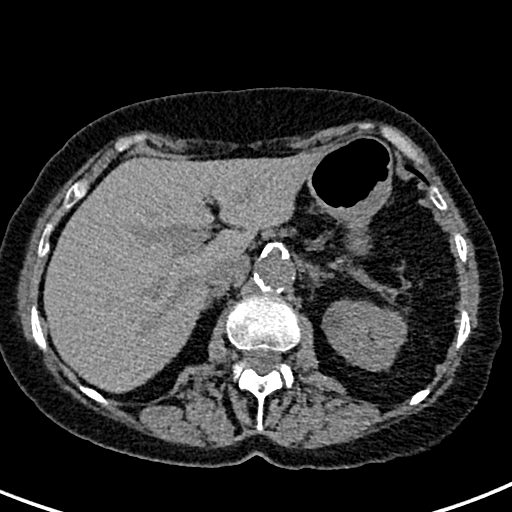
[im 12/153  lung]
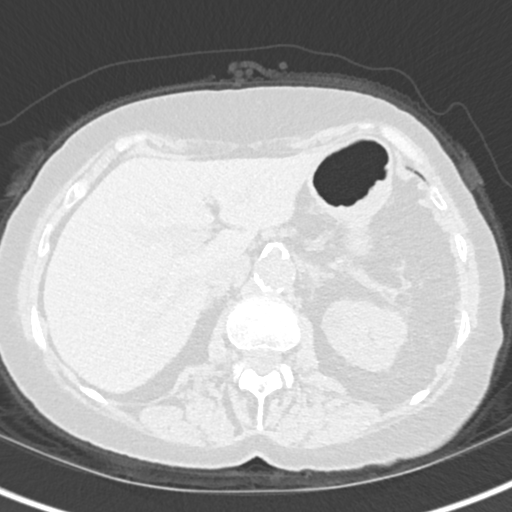
[im 23/153  lung]
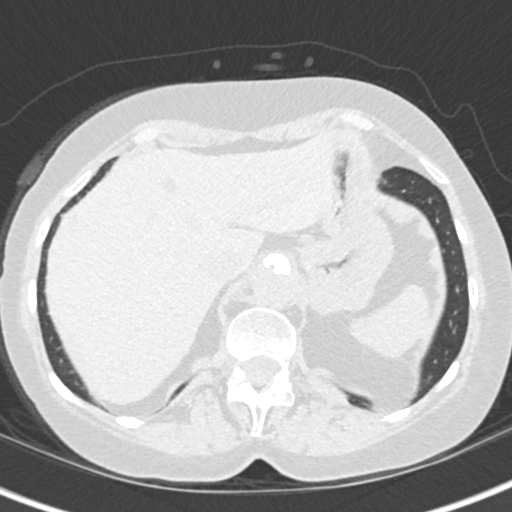
[im 34/153  lung]
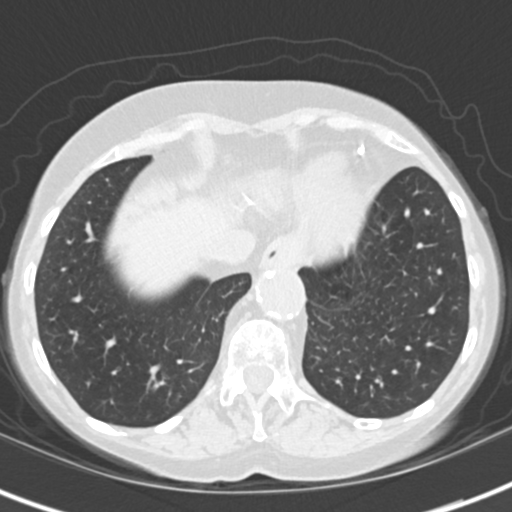
[im 46/153  lung]
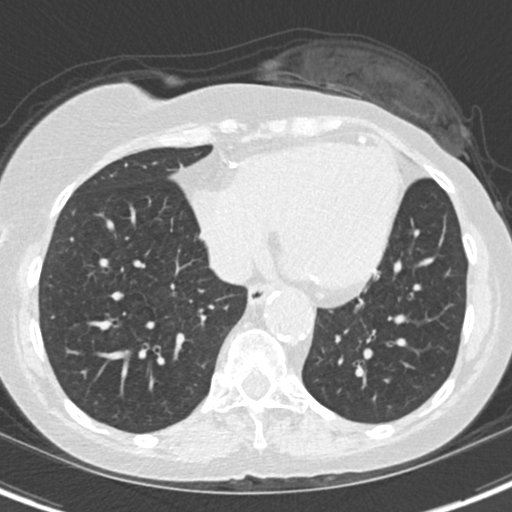
[im 57/153  mediastinal]
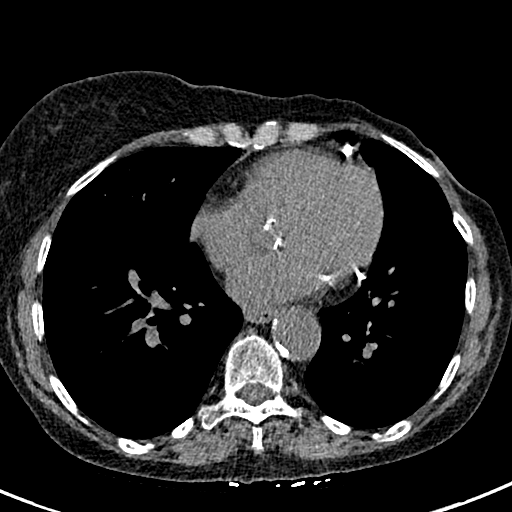
[im 57/153  lung]
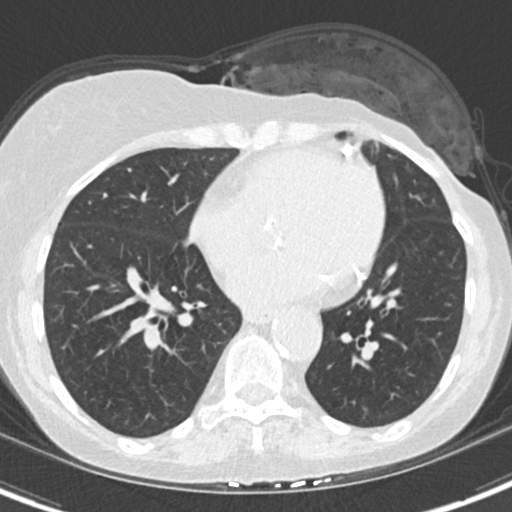
[im 68/153  lung]
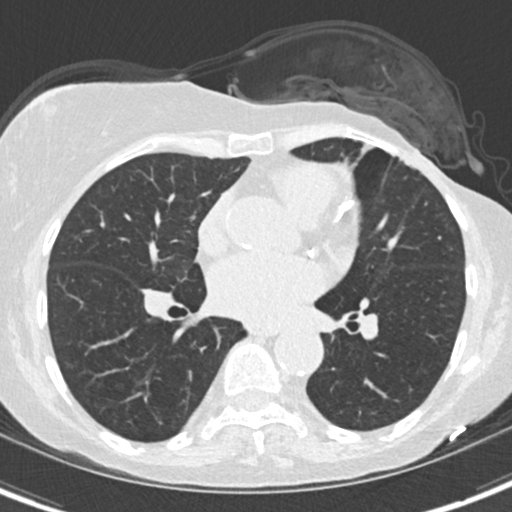
[im 85/153  lung]
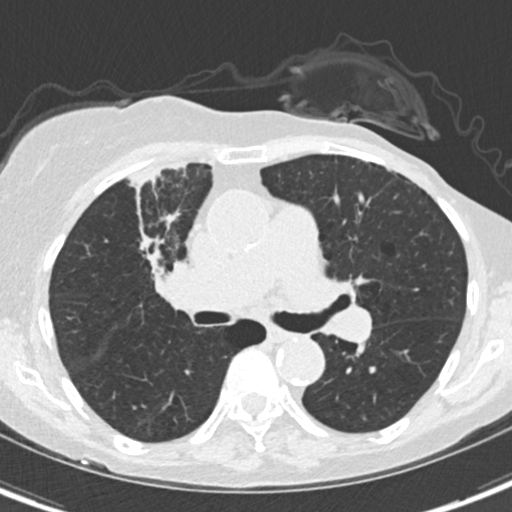
[im 96/153  lung]
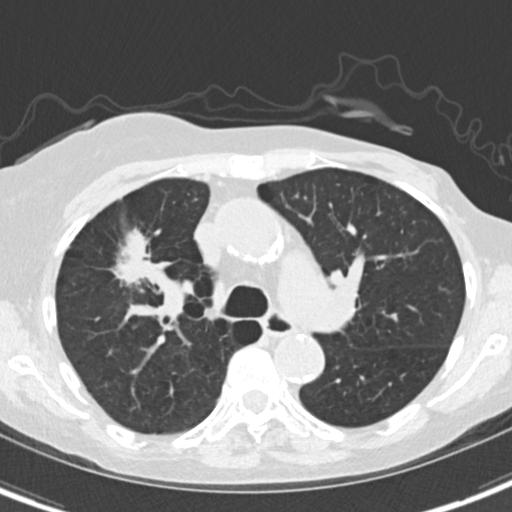
[im 107/153  mediastinal]
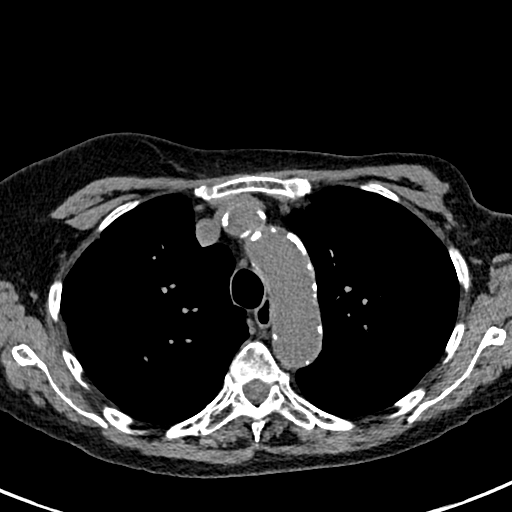
[im 107/153  lung]
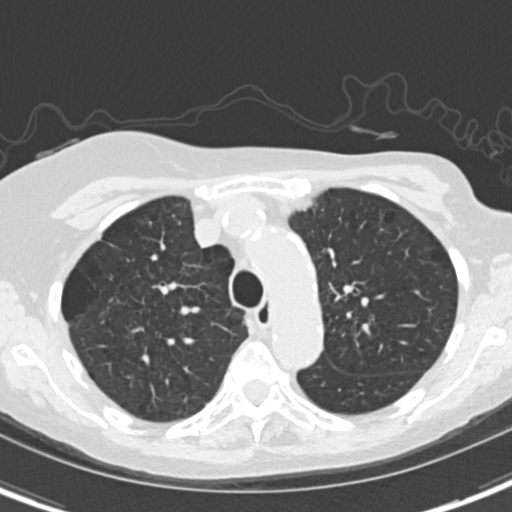
[im 119/153  lung]
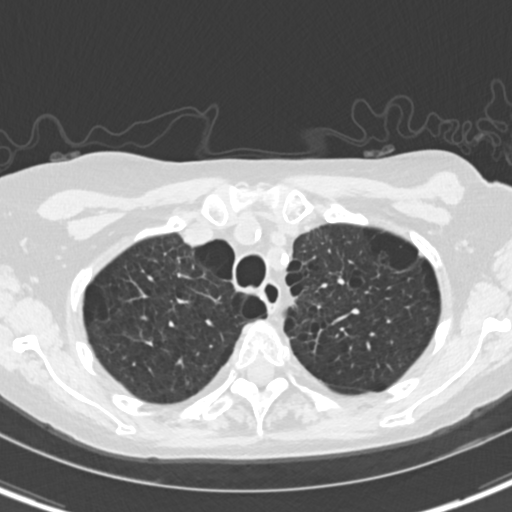
[im 130/153  lung]
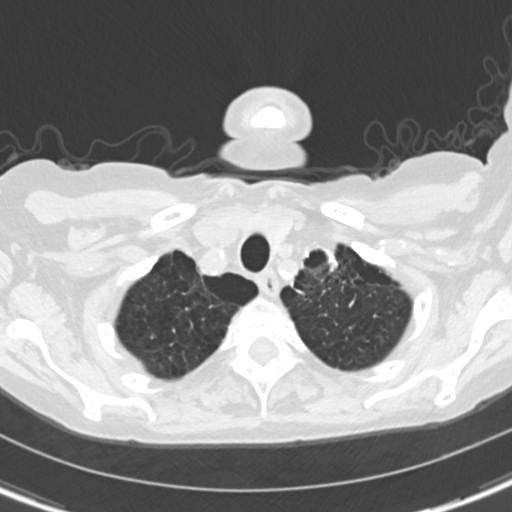
[im 141/153  lung]
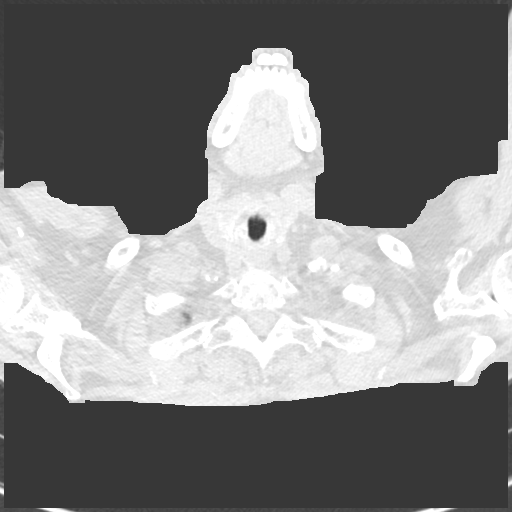

[Series 5: coronal · coronal · 0.61mm/px · 3 of 161 slices shown]
[im 33/161  lung]
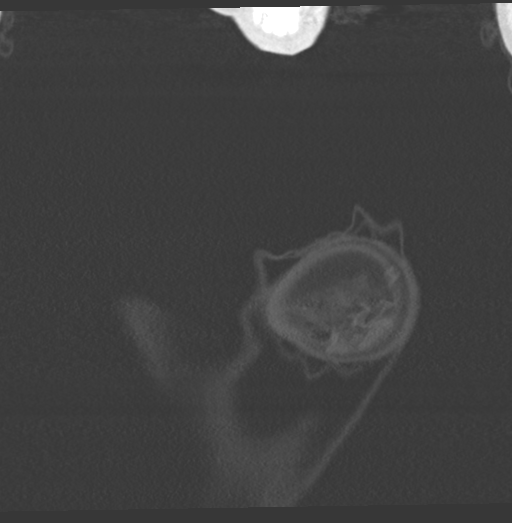
[im 65/161  lung]
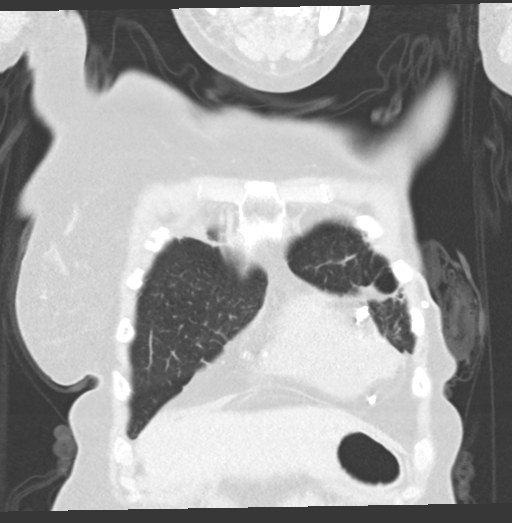
[im 97/161  lung]
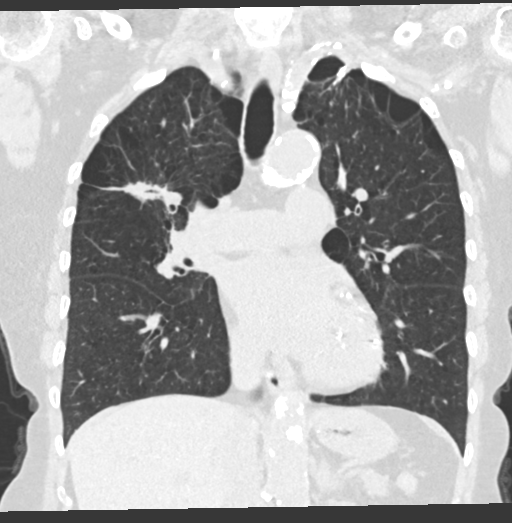

[15 of 36 positions shown; findings below may reference images not displayed]

FINDINGS: Cardiovascular: The heart size is normal. No substantial pericardial
effusion. Coronary artery calcification is evident. Atherosclerotic
calcification is noted in the wall of the thoracic aorta.

Mediastinum/Nodes: No mediastinal lymphadenopathy. No evidence for
gross hilar lymphadenopathy although assessment is limited by the
lack of intravenous contrast on today's study. The esophagus has
normal imaging features. There is no axillary lymphadenopathy.

Lungs/Pleura: Centrilobular and paraseptal emphysema noted. Biapical
pleuroparenchymal scarring, left greater than right is similar to
prior. Irregular nodular opacity in the suprahilar right upper lobe
measures similar today at 3.1 x 2.6 cm compared to 3.5 x 2.9 cm
previously. In the medial right upper lobe, confluent opacity
measured previously at 3.5 x 4.8 cm has become less confluent in the
interval and more platelike in the configuration, measuring 4.1 x
2.6 cm. Stable subpleural reticulation anterior left upper lobe
likely related to post radiation scarring.

Upper Abdomen: Marked fatty deposition in the liver parenchyma seen
previously has markedly improved in the interval. 11 mm low-density
lesion in the medial segment left liver is stable in the interval.

Musculoskeletal: No worrisome lytic or sclerotic osseous
abnormality.
IMPRESSION: 1. Evolution of post radiation changes in the right parahilar lung
without progressive findings.
2. Subpleural reticulation anterior left upper lobe is likely
radiation related given evidence of overlying left mastectomy.
3. Interval resolution of the marked hepatic steatosis seen
previously.
4. Stable 11 mm low-density left liver lesion. Continued attention
on follow-up recommended.
5.  Emphysema. (QO5UX-FH6.7)
6.  Aortic Atherosclerois (QO5UX-170.0)

## 2020-04-12 IMAGING — MG DIGITAL SCREENING UNILATERAL RIGHT MAMMOGRAM WITH CAD AND TOMO
4 series · 4 of 12 positions shown · non-contrast
Comparison: Previous exam(s).

CLINICAL DATA: Screening.

EXAM:
DIGITAL SCREENING UNILATERAL RIGHT MAMMOGRAM WITH CAD AND TOMO

[R CC synth-2D]
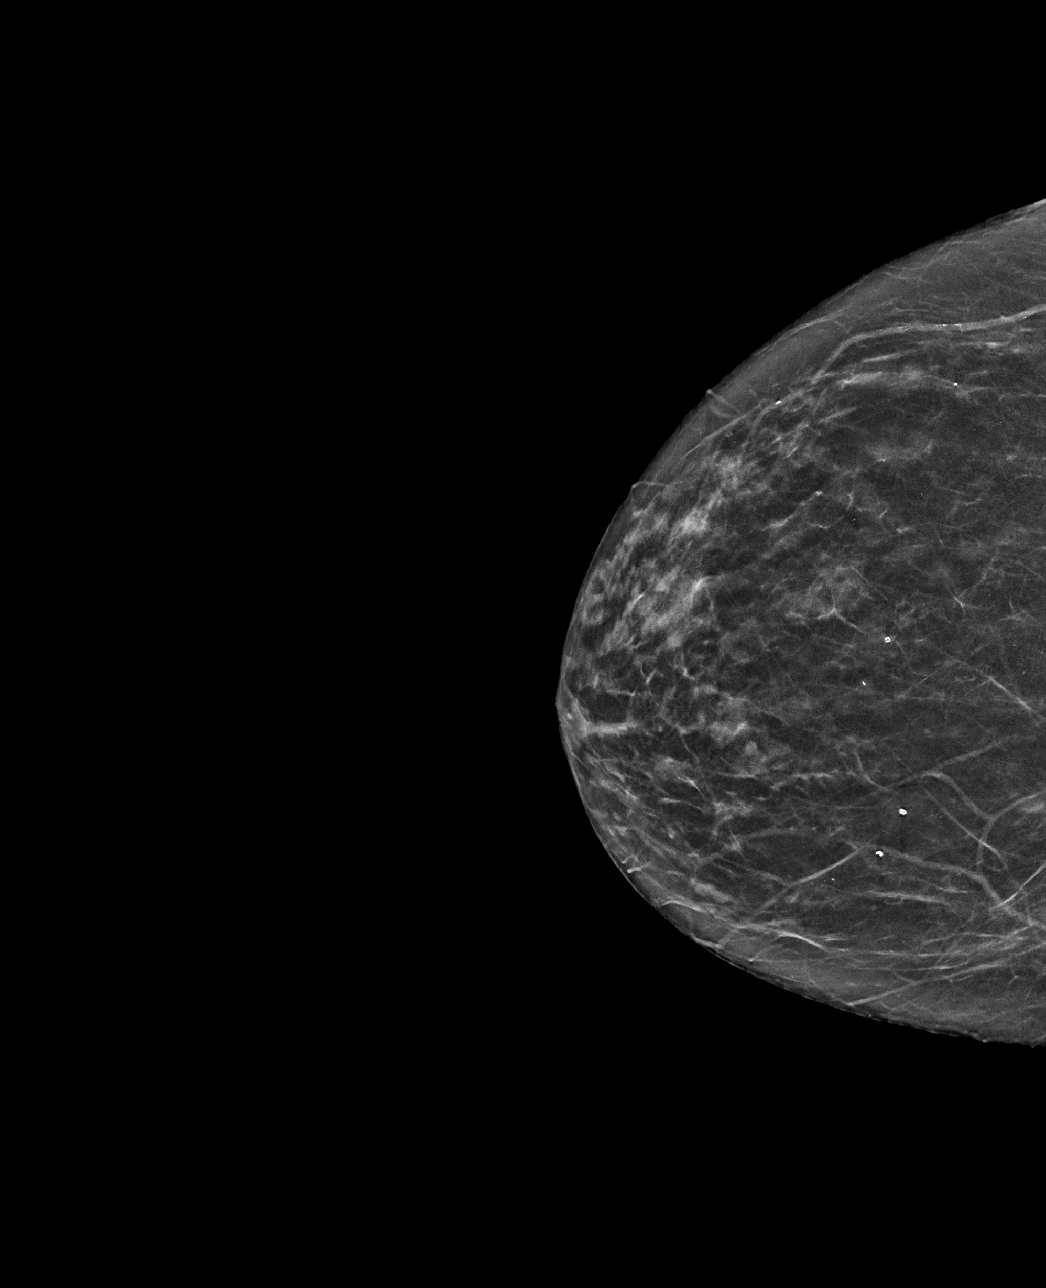

[R MLO synth-2D]
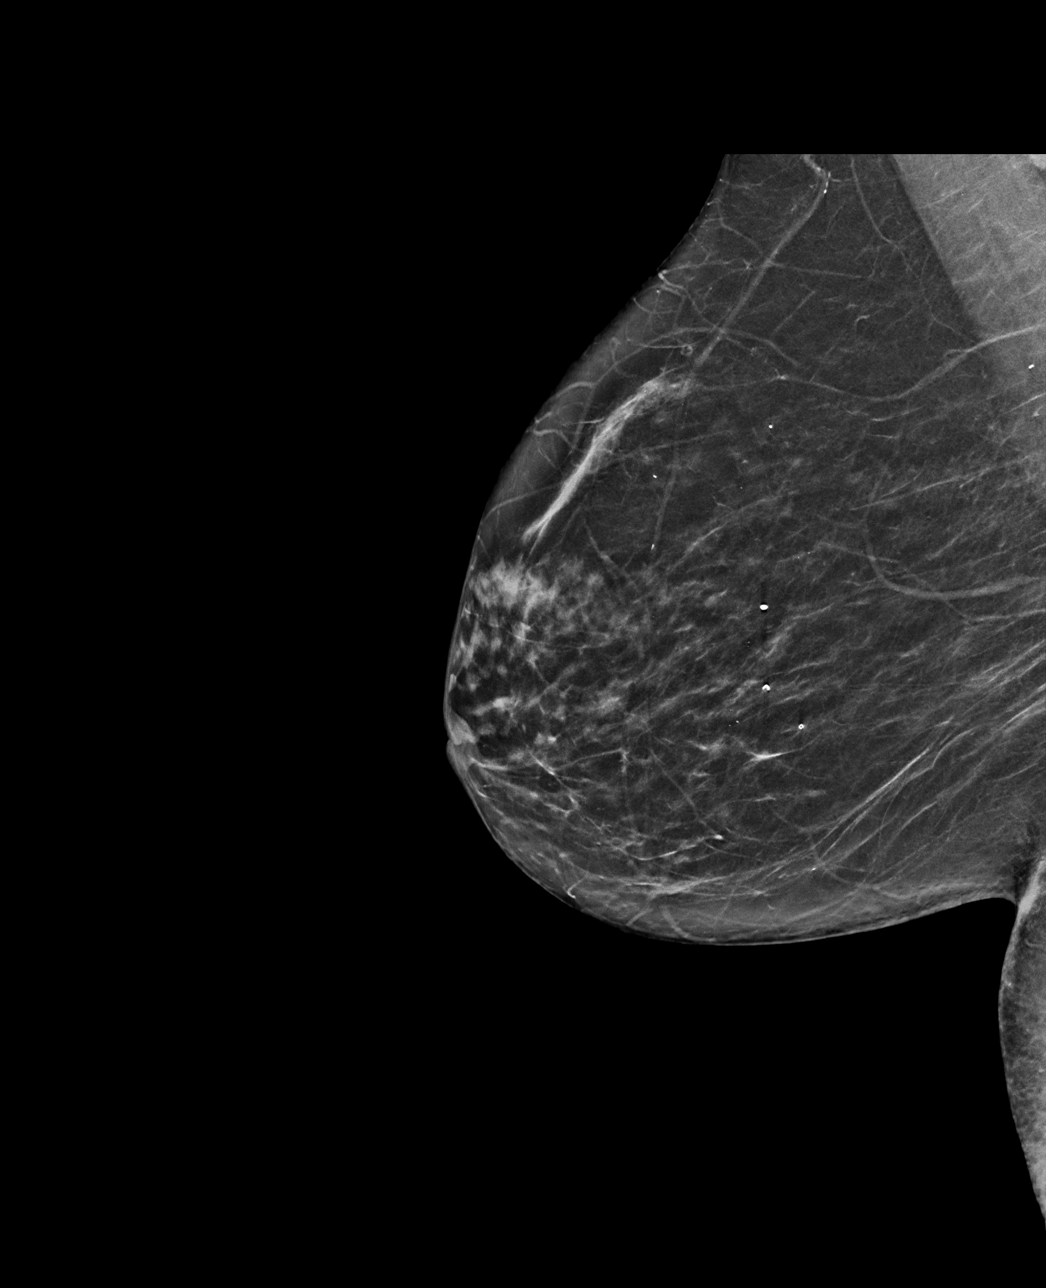

[R CC tomo · tomo slice 27/53.0]
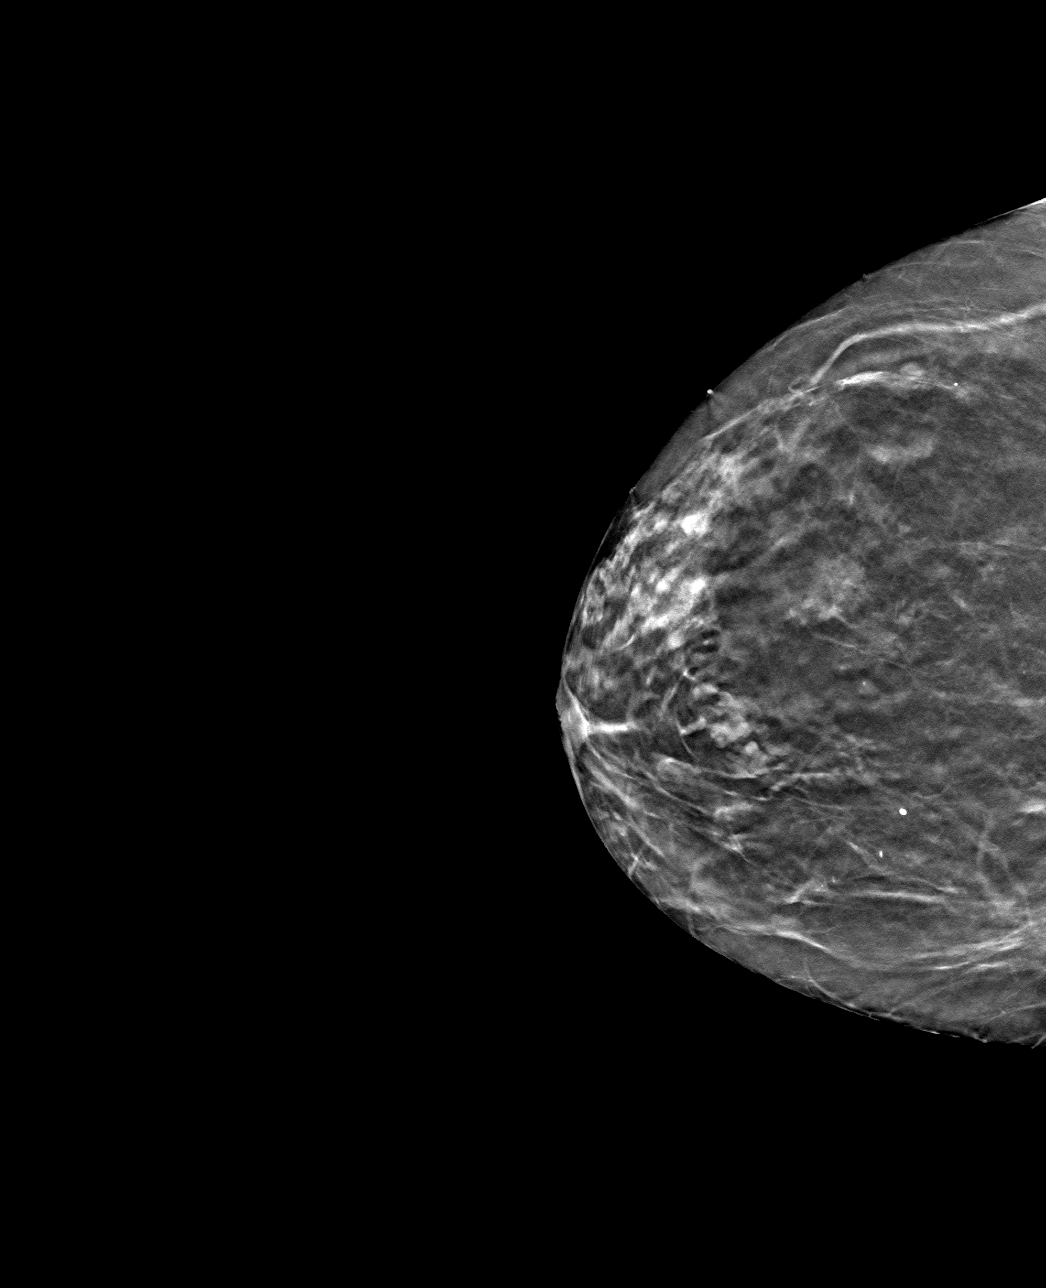

[R MLO tomo · tomo slice 31/60.0]
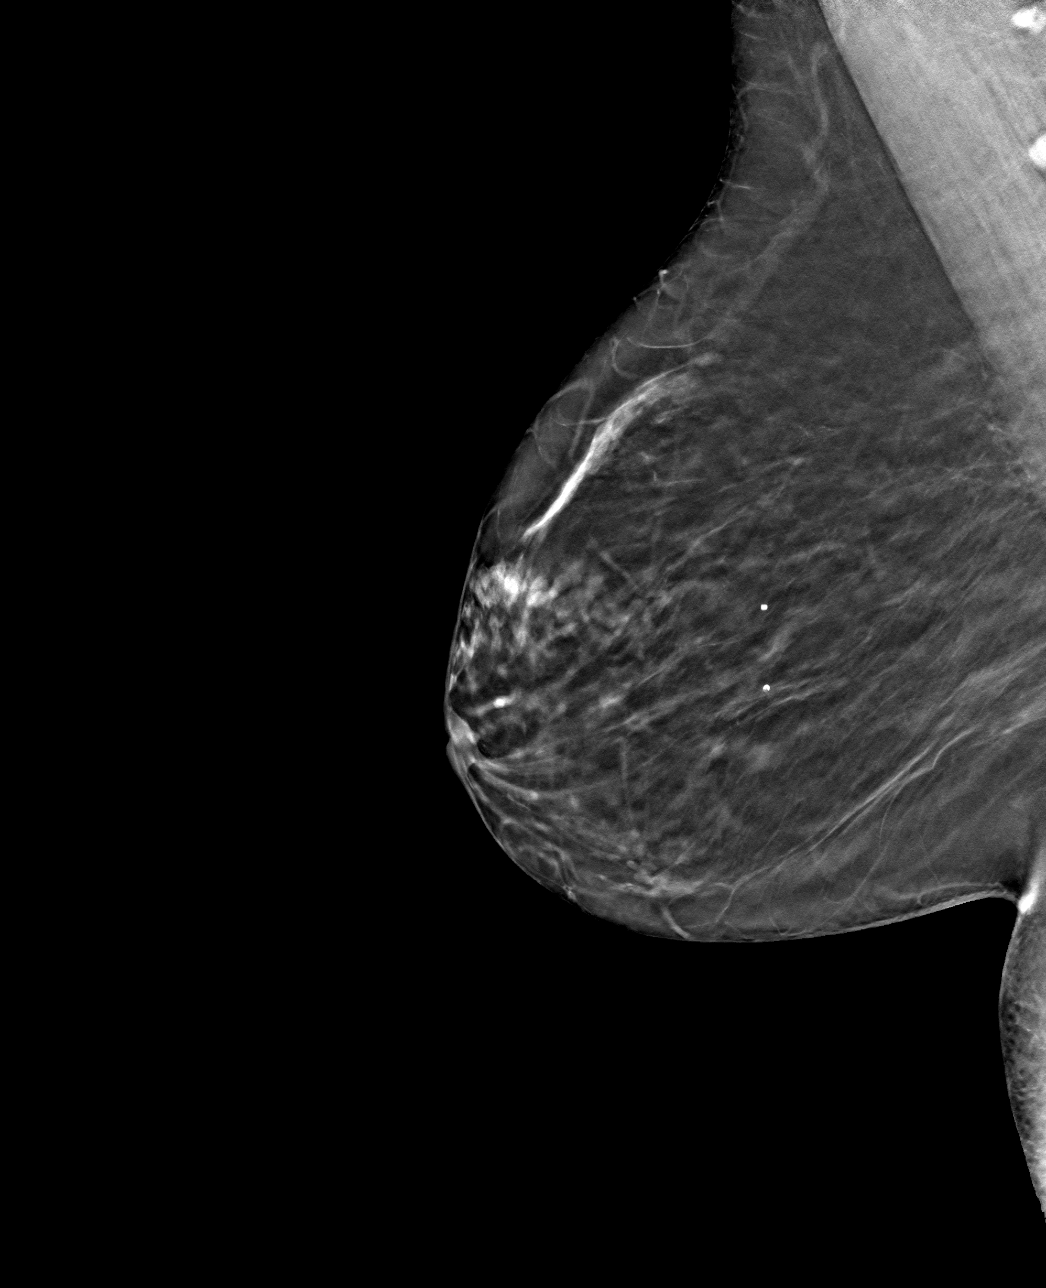

[4 of 12 positions shown; findings below may reference images not displayed]

ACR Breast Density Category b: There are scattered areas of
fibroglandular density.
FINDINGS: There are no findings suspicious for malignancy. Images were
processed with CAD.
IMPRESSION: No mammographic evidence of malignancy. A result letter of this
screening mammogram will be mailed directly to the patient.

RECOMMENDATION:
Screening mammogram in one year. (Code:3Y-2-NJC)

BI-RADS CATEGORY  1: Negative.

## 2020-04-13 ENCOUNTER — Other Ambulatory Visit: Payer: Self-pay

## 2020-04-13 DIAGNOSIS — C155 Malignant neoplasm of lower third of esophagus: Secondary | ICD-10-CM

## 2020-04-26 ENCOUNTER — Other Ambulatory Visit
Admission: RE | Admit: 2020-04-26 | Discharge: 2020-04-26 | Disposition: A | Discharge status: Home / Self Care | Payer: Medicare (Managed Care) | Source: Ambulatory Visit | Attending: Gastroenterology | Admitting: Gastroenterology

## 2020-04-26 ENCOUNTER — Other Ambulatory Visit: Payer: Self-pay

## 2020-04-26 DIAGNOSIS — Z20822 Contact with and (suspected) exposure to covid-19: Secondary | ICD-10-CM | POA: Diagnosis not present

## 2020-04-26 DIAGNOSIS — Z01812 Encounter for preprocedural laboratory examination: Secondary | ICD-10-CM | POA: Diagnosis present

## 2020-04-27 LAB — SARS CORONAVIRUS 2 (TAT 6-24 HRS): SARS Coronavirus 2: NEGATIVE

## 2020-04-28 ENCOUNTER — Ambulatory Visit: Payer: Medicare (Managed Care) | Admitting: Certified Registered Nurse Anesthetist

## 2020-04-28 ENCOUNTER — Other Ambulatory Visit: Payer: Self-pay

## 2020-04-28 ENCOUNTER — Encounter: Payer: Self-pay | Admitting: Gastroenterology

## 2020-04-28 ENCOUNTER — Ambulatory Visit
Admission: RE | Admit: 2020-04-28 | Discharge: 2020-04-28 | Disposition: A | Discharge status: Home / Self Care | Payer: Medicare (Managed Care) | Attending: Gastroenterology | Admitting: Gastroenterology

## 2020-04-28 ENCOUNTER — Encounter
Admission: RE | Disposition: A | Discharge status: Home / Self Care | Payer: Self-pay | Source: Home / Self Care | Attending: Gastroenterology

## 2020-04-28 DIAGNOSIS — Z08 Encounter for follow-up examination after completed treatment for malignant neoplasm: Secondary | ICD-10-CM | POA: Insufficient documentation

## 2020-04-28 DIAGNOSIS — Z9012 Acquired absence of left breast and nipple: Secondary | ICD-10-CM | POA: Diagnosis not present

## 2020-04-28 DIAGNOSIS — J449 Chronic obstructive pulmonary disease, unspecified: Secondary | ICD-10-CM | POA: Insufficient documentation

## 2020-04-28 DIAGNOSIS — Z803 Family history of malignant neoplasm of breast: Secondary | ICD-10-CM | POA: Insufficient documentation

## 2020-04-28 DIAGNOSIS — I509 Heart failure, unspecified: Secondary | ICD-10-CM | POA: Insufficient documentation

## 2020-04-28 DIAGNOSIS — M1990 Unspecified osteoarthritis, unspecified site: Secondary | ICD-10-CM | POA: Diagnosis not present

## 2020-04-28 DIAGNOSIS — I110 Hypertensive heart disease with heart failure: Secondary | ICD-10-CM | POA: Insufficient documentation

## 2020-04-28 DIAGNOSIS — Z79899 Other long term (current) drug therapy: Secondary | ICD-10-CM | POA: Diagnosis not present

## 2020-04-28 DIAGNOSIS — C155 Malignant neoplasm of lower third of esophagus: Secondary | ICD-10-CM | POA: Diagnosis not present

## 2020-04-28 DIAGNOSIS — K228 Other specified diseases of esophagus: Secondary | ICD-10-CM | POA: Insufficient documentation

## 2020-04-28 DIAGNOSIS — Z9221 Personal history of antineoplastic chemotherapy: Secondary | ICD-10-CM | POA: Insufficient documentation

## 2020-04-28 DIAGNOSIS — Z853 Personal history of malignant neoplasm of breast: Secondary | ICD-10-CM | POA: Diagnosis not present

## 2020-04-28 DIAGNOSIS — Z87891 Personal history of nicotine dependence: Secondary | ICD-10-CM | POA: Insufficient documentation

## 2020-04-28 DIAGNOSIS — Z8501 Personal history of malignant neoplasm of esophagus: Secondary | ICD-10-CM | POA: Diagnosis present

## 2020-04-28 DIAGNOSIS — F329 Major depressive disorder, single episode, unspecified: Secondary | ICD-10-CM | POA: Diagnosis not present

## 2020-04-28 DIAGNOSIS — Z9981 Dependence on supplemental oxygen: Secondary | ICD-10-CM | POA: Diagnosis not present

## 2020-04-28 HISTORY — PX: ESOPHAGOGASTRODUODENOSCOPY (EGD) WITH PROPOFOL: SHX5813

## 2020-04-28 SURGERY — ESOPHAGOGASTRODUODENOSCOPY (EGD) WITH PROPOFOL
Anesthesia: General

## 2020-04-28 MED ORDER — LIDOCAINE HCL (CARDIAC) PF 100 MG/5ML IV SOSY
PREFILLED_SYRINGE | INTRAVENOUS | Status: DC | PRN
  Administered 2020-04-28: 50 mg via INTRAVENOUS

## 2020-04-28 MED ORDER — PROPOFOL 10 MG/ML IV BOLUS
INTRAVENOUS | Status: DC | PRN
  Administered 2020-04-28: 60 mg via INTRAVENOUS
  Administered 2020-04-28: 20 mg via INTRAVENOUS
  Administered 2020-04-28: 40 mg via INTRAVENOUS

## 2020-04-28 MED ORDER — PROPOFOL 500 MG/50ML IV EMUL
INTRAVENOUS | Status: DC | PRN
  Administered 2020-04-28: 150 ug/kg/min via INTRAVENOUS

## 2020-04-28 MED ORDER — PROPOFOL 500 MG/50ML IV EMUL
INTRAVENOUS | Status: AC
  Filled 2020-04-28: qty 50

## 2020-04-28 MED ORDER — LIDOCAINE HCL (PF) 2 % IJ SOLN
INTRAMUSCULAR | Status: AC
  Filled 2020-04-28: qty 5

## 2020-04-28 MED ORDER — SODIUM CHLORIDE 0.9 % IV SOLN: INTRAVENOUS | Status: DC

## 2020-04-28 MED ORDER — LIDOCAINE HCL (PF) 1 % IJ SOLN
INTRAMUSCULAR | Status: AC
  Filled 2020-04-28: qty 2

## 2020-04-28 NOTE — Anesthesia Preprocedure Evaluation (Signed)
Anesthesia Evaluation  Patient identified by MRN, date of birth, ID band Patient awake    Reviewed: Allergy & Precautions, H&P , NPO status , Patient's Chart, lab work & pertinent test results  Airway Mallampati: II  TM Distance: >3 FB     Dental  (+) Missing, Edentulous Upper   Pulmonary neg shortness of breath, COPD,  COPD inhaler and oxygen dependent, neg recent URI, former smoker,    breath sounds clear to auscultation       Cardiovascular hypertension, (-) angina+CHF  + Valvular Problems/Murmurs  Rhythm:regular Rate:Normal  1. EF 50 to 55%. Mild LVH. 2. Grade I diastolic dysfunction (impaired relaxation).  3. Small to moderate inferior myocardial infarction.  4. The left ventricle demonstrates regional wall motion abnormalities.  5. Normal RV 6. Moderately elevated pulmonary artery systolic pressure.    Neuro/Psych PSYCHIATRIC DISORDERS Anxiety Depression negative neurological ROS     GI/Hepatic Neg liver ROS, PUD, GERD  ,  Endo/Other  negative endocrine ROS  Renal/GU CRFRenal disease  negative genitourinary   Musculoskeletal   Abdominal   Peds  Hematology  (+) Blood dyscrasia, anemia ,   Anesthesia Other Findings Past Medical History: No date: Anxiety No date: Arthritis 2000: Breast cancer ([place]) left : Cancer ([place])     Comment:  breast cancer 2000, chemo tx's with total mastectomy and              lymph nodes resected.  02/21/2016: Cancer of right lung ([place])     Comment:  rad tx's.  No date: CHF (congestive heart failure) (HCC) No date: COPD (chronic obstructive pulmonary disease) (HCC) No date: Dependence on supplemental oxygen No date: Depression No date: Heart murmur No date: Hypertension No date: Lung nodule No date: Lymphedema No date: Personal history of chemotherapy No date: Personal history of radiation therapy No date: Shortness of breath dyspnea     Comment:  with exertion 2001:  Status post chemotherapy     Comment:  left breast cancer 2001: Status post radiation therapy     Comment:  left breast cancer  Past Surgical History: No date: Breast Biospy; Left     Comment:  [facility] No date: BREAST SURGERY 04/30/2018: COLONOSCOPY; N/A     Comment:  Procedure: COLONOSCOPY;  Surgeon: Virgel Manifold,               MD;  Location: [facility] ENDOSCOPY;  Service: Endoscopy;                Laterality: N/A; 07/22/2018: COLONOSCOPY; N/A     Comment:  Procedure: COLONOSCOPY;  Surgeon: Virgel Manifold,               MD;  Location: [facility] ENDOSCOPY;  Service: Endoscopy;                Laterality: N/A; No date: DILATION AND CURETTAGE OF UTERUS 04/11/2016: ELECTROMAGNETIC NAVIGATION BROCHOSCOPY; Right     Comment:  Procedure: ELECTROMAGNETIC NAVIGATION BRONCHOSCOPY;                Surgeon: Vilinda Boehringer, MD;  Location: [facility] ORS;                Service: Cardiopulmonary;  Laterality: Right; 07/22/2018: ESOPHAGOGASTRODUODENOSCOPY; N/A     Comment:  Procedure: ESOPHAGOGASTRODUODENOSCOPY (EGD);  Surgeon:               Virgel Manifold, MD;  Location: [facility] ENDOSCOPY;  Service: Endoscopy;  Laterality: N/A; 05/07/2018: ESOPHAGOGASTRODUODENOSCOPY (EGD) WITH PROPOFOL; N/A     Comment:  Procedure: ESOPHAGOGASTRODUODENOSCOPY (EGD) WITH               PROPOFOL;  Surgeon: Lucilla Lame, MD;  Location: [facility]               ENDOSCOPY;  Service: Endoscopy;  Laterality: N/A; 04/24/2019: ESOPHAGOGASTRODUODENOSCOPY (EGD) WITH PROPOFOL; N/A     Comment:  Procedure: ESOPHAGOGASTRODUODENOSCOPY (EGD) WITH               PROPOFOL;  Surgeon: Jonathon Bellows, MD;  Location: [facility]               ENDOSCOPY;  Service: Gastroenterology;  Laterality: N/A; 05/07/2019: EUS; N/A     Comment:  Procedure: FULL UPPER ENDOSCOPIC ULTRASOUND (EUS)               RADIAL;  Surgeon: Jola Schmidt, MD;  Location: [facility]               ENDOSCOPY;  Service: Endoscopy;  Laterality: N/A; No date: history of  colonoscopy] 07/22/2018: ILEOSCOPY; N/A     Comment:  Procedure: ILEOSCOPY THROUGH STOMA;  Surgeon: Virgel Manifold, MD;  Location: [facility] ENDOSCOPY;  Service:               Endoscopy;  Laterality: N/A; No date: ILEOSTOMY 09/08/2018: ILEOSTOMY; N/A     Comment:  Procedure: ILEOSTOMY REVISION POSSIBLE CREATION;                Surgeon: Herbert Pun, MD;  Location: [facility] ORS;               Service: General;  Laterality: N/A; 08/15/2018: ILEOSTOMY CLOSURE; N/A     Comment:  Procedure: DILATION OF ILEOSTOMY STRICTURE;  Surgeon:               Herbert Pun, MD;  Location: [facility] ORS;  Service:              General;  Laterality: N/A; 05/04/2018: LAPAROTOMY; Right     Comment:  Procedure: EXPLORATORY LAPAROTOMY right colectomy right               and left ostomy;  Surgeon: Herbert Pun, MD;                Location: [facility] ORS;  Service: General;  Laterality:               Right; No date: LUNG BIOPSY No date: MASTECTOMY; Left     Comment:  2000, [facility] No date: ROTATOR CUFF REPAIR; Right     Comment:  [facility]     Reproductive/Obstetrics negative OB ROS                             Anesthesia Physical  Anesthesia Plan  ASA: III  Anesthesia Plan: General   Post-op Pain Management:    Induction:   PONV Risk Score and Plan: Propofol infusion and TIVA  Airway Management Planned: Natural Airway and Nasal Cannula  Additional Equipment:   Intra-op Plan:   Post-operative Plan:   Informed Consent: I have reviewed the patients History and Physical, chart, labs and discussed the procedure including the risks, benefits and alternatives for the proposed anesthesia with the patient or authorized representative who has indicated his/her understanding and acceptance.     Dental  Advisory Given  Plan Discussed with: Anesthesiologist  Anesthesia Plan Comments:         Anesthesia Quick Evaluation

## 2020-04-28 NOTE — Transfer of Care (Signed)
Immediate Anesthesia Transfer of Care Note  Patient: Danielle [name]  Procedure(s) Performed: ESOPHAGOGASTRODUODENOSCOPY (EGD) WITH PROPOFOL (N/A )  Patient Location: PACU  Anesthesia Type:General  Level of Consciousness: awake, alert  and oriented  Airway & Oxygen Therapy: Patient Spontanous Breathing and Patient connected to nasal cannula oxygen  Post-op Assessment: Report given to RN  Post vital signs: Reviewed and stable  Last Vitals:  Vitals Value Taken Time  BP 116/84 04/28/20 1009  Temp 36.1 C 04/28/20 1007  Pulse 92 04/28/20 1009  Resp 14 04/28/20 1009  SpO2 96 % 04/28/20 1009    Last Pain:  Vitals:   04/28/20 1007  TempSrc: Temporal  PainSc:          Complications: No complications documented.

## 2020-04-28 NOTE — Anesthesia Procedure Notes (Signed)
Date/Time: 04/28/2020 9:44 AM Performed by: Johnna Acosta, CRNA Pre-anesthesia Checklist: Patient identified, Emergency Drugs available, Suction available, Patient being monitored and Timeout performed Patient Re-evaluated:Patient Re-evaluated prior to induction Oxygen Delivery Method: Nasal cannula Preoxygenation: Pre-oxygenation with 100% oxygen Induction Type: IV induction

## 2020-04-28 NOTE — Op Note (Signed)
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 Procedure:             Pre-Anesthesia Assessment:                        - Prior to the procedure, a History and Physical was                         performed, and patient medications, allergies and                         sensitivities were reviewed. The patient's tolerance                         of previous anesthesia was reviewed.                        - The risks and benefits of the procedure and the                         sedation options and risks were discussed with the                         patient. All questions were answered and informed                         consent was obtained.                        - ASA Grade Assessment: III - A patient with severe                         systemic disease.                        After obtaining informed consent, the endoscope was                         passed under direct vision. Throughout the procedure,                         the patient's blood pressure, pulse, and oxygen                         saturations were monitored continuously. The  Endoscope                         was introduced through the mouth, and advanced to the                         third part of duodenum. The upper GI endoscopy was                         accomplished with ease. The patient tolerated the                          procedure well. Findings:      The examined duodenum was normal.      The stomach was normal.      The cardia and gastric fundus were normal on retroflexion.      A single 8 mm mucosal nodule with a localized distribution was found in       the lower third of the esophagus, 30 cm from the incisors. Biopsies were       taken with a cold forceps for histology. To prevent bleeding after the       polypectomy, one hemostatic clip was successfully placed. There was no       bleeding at the end of the procedure. Impression:            - Normal examined duodenum.                        - Normal stomach.                        - Mucosal nodule found in the esophagus. Biopsied.                         Clip was placed. Recommendation:        - Discharge patient to home (with escort).                        - Resume previous diet.                        - Continue present medications.                        - Await pathology results. Procedure Code(s):     --- Professional ---                        [phone], Esophagogastroduodenoscopy, flexible,                         transoral; with biopsy, single or multiple Diagnosis Code(s):     --- Professional ---                        C15.9, Malignant neoplasm of esophagus, unspecified                        K22.8, Other specified diseases of esophagus CPT copyright 2019 American Medical Association. All rights reserved. The codes documented in this report are preliminary and upon coder review may  be revised to meet current compliance  requirements. Jonathon Bellows, MD Jonathon Bellows MD, MD 04/28/2020 10:02:57 AM This report has been signed electronically. Number of Addenda: 0 Note Initiated On: 04/28/2020 9:35 AM Estimated Blood Loss:  Estimated blood loss: none.      [facility]

## 2020-04-28 NOTE — H&P (Signed)
Jonathon Bellows, MD [address] [place], [place], [place], [address] [id number], [place], [place], [place], [address] Phone: [phone]  Fax: [phone]  Primary Care Physician:  [facility] [place]   Pre-Procedure History & Physical: HPI:  Danielle [name] is a 64 y.o. female is here for an endoscopy    Past Medical History:  Diagnosis Date  . Anxiety   . Arthritis   . Breast cancer ([place]) 2000  . Cancer ([place]) left    breast cancer 2000, chemo tx's with total mastectomy and lymph nodes resected.   . Cancer of right lung ([place]) 02/21/2016   rad tx's.   . CHF (congestive heart failure) ([place])   . COPD (chronic obstructive pulmonary disease) ([place])   . Dependence on supplemental oxygen   . Depression   . Heart murmur   . Hypertension   . Lung nodule   . Lymphedema   . Personal history of chemotherapy   . Personal history of radiation therapy   . Shortness of breath dyspnea    with exertion  . Status post chemotherapy 2001   left breast cancer  . Status post radiation therapy 2001   left breast cancer    Past Surgical History:  Procedure Laterality Date  . Breast Biospy Left    [facility]  . BREAST SURGERY    . COLONOSCOPY N/A 04/30/2018   Procedure: COLONOSCOPY;  Surgeon: Virgel Manifold, MD;  Location: [facility] ENDOSCOPY;  Service: Endoscopy;  Laterality: N/A;  . COLONOSCOPY N/A 07/22/2018   Procedure: COLONOSCOPY;  Surgeon: Virgel Manifold, MD;  Location: [facility] ENDOSCOPY;  Service: Endoscopy;  Laterality: N/A;  . DILATION AND CURETTAGE OF UTERUS    . ELECTROMAGNETIC NAVIGATION BROCHOSCOPY Right 04/11/2016   Procedure: ELECTROMAGNETIC NAVIGATION BRONCHOSCOPY;  Surgeon: Vilinda Boehringer, MD;  Location: [facility] ORS;  Service: Cardiopulmonary;  Laterality: Right;  . ESOPHAGOGASTRODUODENOSCOPY N/A 07/22/2018   Procedure: ESOPHAGOGASTRODUODENOSCOPY (EGD);  Surgeon: Virgel Manifold, MD;  Location: [facility] ENDOSCOPY;  Service: Endoscopy;  Laterality: N/A;  .  ESOPHAGOGASTRODUODENOSCOPY (EGD) WITH PROPOFOL N/A 05/07/2018   Procedure: ESOPHAGOGASTRODUODENOSCOPY (EGD) WITH PROPOFOL;  Surgeon: Lucilla Lame, MD;  Location: [facility] ENDOSCOPY;  Service: Endoscopy;  Laterality: N/A;  . ESOPHAGOGASTRODUODENOSCOPY (EGD) WITH PROPOFOL N/A 04/24/2019   Procedure: ESOPHAGOGASTRODUODENOSCOPY (EGD) WITH PROPOFOL;  Surgeon: Jonathon Bellows, MD;  Location: [facility] ENDOSCOPY;  Service: Gastroenterology;  Laterality: N/A;  . ESOPHAGOGASTRODUODENOSCOPY (EGD) WITH PROPOFOL N/A 01/12/2020   Procedure: ESOPHAGOGASTRODUODENOSCOPY (EGD) WITH PROPOFOL;  Surgeon: Jonathon Bellows, MD;  Location[facility] ENDOSCOPY;  Service: Gastroenterology;  Laterality: N/A;  . EUS N/A 05/07/2019   Procedure: FULL UPPER ENDOSCOPIC ULTRASOUND (EUS) RADIAL;  Surgeon: Jola Schmidt, MD;  Location: [facility] ENDOSCOPY;  Service: Endoscopy;  Laterality: N/A;  . history of colonoscopy]    . ILEOSCOPY N/A 07/22/2018   Procedure: ILEOSCOPY THROUGH STOMA;  Surgeon: Virgel Manifold, MD;  Location: [facility] ENDOSCOPY;  Service: Endoscopy;  Laterality: N/A;  . ILEOSTOMY    . ILEOSTOMY N/A 09/08/2018   Procedure: ILEOSTOMY REVISION POSSIBLE CREATION;  Surgeon: Herbert Pun, MD;  Location: [facility] ORS;  Service: General;  Laterality: N/A;  . ILEOSTOMY CLOSURE N/A 08/15/2018   Procedure: DILATION OF ILEOSTOMY STRICTURE;  Surgeon: Herbert Pun, MD;  Location: [facility] ORS;  Service: General;  Laterality: N/A;  . LAPAROTOMY Right 05/04/2018   Procedure: EXPLORATORY LAPAROTOMY right colectomy right and left ostomy;  Surgeon: Herbert Pun, MD;  Location: [facility] ORS;  Service: General;  Laterality: Right;  . LUNG BIOPSY    .  MASTECTOMY Left    2000, [facility]  . ROTATOR CUFF REPAIR Right    [facility]    Prior to Admission medications   Medication Sig Start Date End Date Taking? Authorizing Provider  albuterol (PROVENTIL HFA;VENTOLIN HFA) 108 (90 Base) MCG/ACT inhaler Inhale 2 puffs into the lungs every 6 (six) hours as needed  for wheezing or shortness of breath. 04/24/18  Yes Demetrios Loll, MD  citalopram (CELEXA) 40 MG tablet Take 40 mg by mouth daily.    Yes [provider]  Fluticasone-Umeclidin-Vilant 100-62.5-25 MCG/INH AEPB Inhale 1 puff into the lungs daily.  01/07/19  Yes [provider]  guaiFENesin-dextromethorphan (ROBITUSSIN DM) 100-10 MG/5ML syrup Take 5 mLs by mouth every 4 (four) hours as needed for cough.   Yes [provider]  loperamide (IMODIUM A-D) 2 MG tablet Take 1 tablet (2 mg total) by mouth as needed for diarrhea or loose stools. 10/04/18  Yes Herbert Pun, MD  Multiple Vitamin (MULTIVITAMIN WITH MINERALS) TABS tablet Take 1 tablet by mouth daily. 06/06/18  Yes Fritzi Mandes, MD  Nutritional Supplements (FEEDING SUPPLEMENT, NEPRO CARB STEADY,) LIQD Take 237 mLs by mouth 3 (three) times daily between meals. 10/21/19  Yes Loletha Grayer, MD  pantoprazole (PROTONIX) 40 MG tablet Take 1 tablet (40 mg total) by mouth 2 (two) times daily before a meal. 07/23/18  Yes Sudini, Srikar, MD  sodium bicarbonate 650 MG tablet Take 2 tablets (1,300 mg total) by mouth 3 (three) times daily. 10/21/19  Yes Wieting, Richard, MD  sucralfate (CARAFATE) 1 GM/10ML suspension Take 10 mLs (1 g total) by mouth 4 (four) times daily. 08/18/19  Yes Chrystal, Eulas Post, MD  torsemide (DEMADEX) 20 MG tablet Take 1 tablet by mouth daily as needed (swelling).  12/02/19 12/01/20 Yes [provider]  acetaminophen (TYLENOL) 325 MG tablet Take 2 tablets (650 mg total) by mouth every 6 (six) hours as needed for mild pain (or Fever >/= 101). 12/24/18   Nicholes Mango, MD  amLODipine (NORVASC) 5 MG tablet Take by mouth. 02/11/20 02/10/21  [provider]  calcium carbonate (TUMS - DOSED IN MG ELEMENTAL CALCIUM) 500 MG chewable tablet Chew 0.5 tablets (100 mg of elemental calcium total) by mouth daily. 11/05/19   Enzo Bi, MD  calcium-vitamin D (OSCAL WITH D) 250-125 MG-UNIT tablet Take 1 tablet by mouth  daily.    [provider]  carvedilol (COREG) 6.25 MG tablet Take 1 tablet (6.25 mg total) by mouth 2 (two) times daily with a meal. 11/11/19 03/07/20  Ralene Muskrat B, MD  ferrous sulfate 325 (65 FE) MG tablet Take 1 tablet (325 mg total) by mouth 2 (two) times daily with a meal. 07/02/18   [name], MD    Allergies as of 04/14/2020  . (No Known Allergies)    Family History  Problem Relation Age of Onset  . Breast cancer Mother 70  . Cancer Mother        Breast   . Cirrhosis Father   . Breast cancer Paternal Aunt 72  . Cancer Maternal Aunt        Breast     Social History   Socioeconomic History  . Marital status: Divorced    Spouse name: Not on file  . Number of children: 3  . Years of education: Not on file  . Highest education level: Not on file  Occupational History  . Occupation: Retired   Tobacco Use  . Smoking status: Former Smoker    Packs/day: 0.50  Years: 20.00    Pack years: 10.00    Types: Cigarettes    Quit date: 07/02/2012    Years since quitting: 7.8  . Smokeless tobacco: Current User    Types: Snuff  . Tobacco comment: quit 2014  Vaping Use  . Vaping Use: Never used  Substance and Sexual Activity  . Alcohol use: Not Currently    Comment: Occasionally  . Drug use: No  . Sexual activity: Not Currently  Other Topics Concern  . Not on file  Social History Narrative  . Not on file   Social Determinants of Health   Financial Resource Strain:   . Difficulty of Paying Living Expenses:   Food Insecurity:   . Worried About Charity fundraiser in the Last Year:   . Arboriculturist in the Last Year:   Transportation Needs:   . Film/video editor (Medical):   [name] Lack of Transportation (Non-Medical):   Physical Activity:   . Days of Exercise per Week:   . Minutes of Exercise per Session:   Stress:   . Feeling of Stress :   Social Connections:   . Frequency of Communication with Friends and Family:   . Frequency of  Social Gatherings with Friends and Family:   . Attends Religious Services:   . Active Member of Clubs or Organizations:   . Attends Archivist Meetings:   [name] Marital Status:   Intimate Partner Violence:   . Fear of Current or Ex-Partner:   . Emotionally Abused:   [name] Physically Abused:   . Sexually Abused:     Review of Systems: See HPI, otherwise negative ROS  Physical Exam: BP (!) 154/86   Pulse 77   Temp (!) 97.4 F (36.3 C) (Tympanic)   Resp 16   Ht 5\' 6"  (1.676 m)   Wt 56.2 kg   SpO2 99%   BMI 20.01 kg/m  General:   Alert,  pleasant and cooperative in NAD Head:  Normocephalic and atraumatic. Neck:  Supple; no masses or thyromegaly. Lungs:  Clear throughout to auscultation, normal respiratory effort.    Heart:  +S1, +S2, Regular rate and rhythm, No edema. Abdomen:  Soft, nontender and nondistended. Normal bowel sounds, without guarding, and without rebound.   Neurologic:  Alert and  oriented x4;  grossly normal neurologically.  Impression/Plan: Danielle [name] is here for an endoscopy  to be performed for  evaluation of esophageal cancer s/p treatment     Risks, benefits, limitations, and alternatives regarding endoscopy have been reviewed with the patient.  Questions have been answered.  All parties agreeable.   Jonathon Bellows, MD  04/28/2020, 9:33 AM

## 2020-04-28 NOTE — Anesthesia Postprocedure Evaluation (Signed)
Anesthesia Post Note  Patient: Danielle [name]  Procedure(s) Performed: ESOPHAGOGASTRODUODENOSCOPY (EGD) WITH PROPOFOL (N/A )  Patient location during evaluation: Endoscopy Anesthesia Type: General Level of consciousness: awake and alert Pain management: pain level controlled Vital Signs Assessment: post-procedure vital signs reviewed and stable Respiratory status: spontaneous breathing, nonlabored ventilation, respiratory function stable and patient connected to nasal cannula oxygen Cardiovascular status: blood pressure returned to baseline and stable Postop Assessment: no apparent nausea or vomiting Anesthetic complications: no   No complications documented.   Last Vitals:  Vitals:   04/28/20 1007 04/28/20 1009  BP: 116/84 116/84  Pulse: 96 92  Resp: 17 14  Temp: (!) 36.1 C   SpO2: 98% 96%    Last Pain:  Vitals:   04/28/20 1009  TempSrc:   PainSc: 0-No pain                 Martha Clan

## 2020-04-29 ENCOUNTER — Telehealth: Payer: Self-pay

## 2020-04-29 ENCOUNTER — Encounter: Payer: Self-pay | Admitting: Gastroenterology

## 2020-04-29 LAB — SURGICAL PATHOLOGY

## 2020-04-29 NOTE — Telephone Encounter (Signed)
Patient called, no answer. Left message to return call to [facility]. Patient will need to be informed of potential risk of contamination of the [place] water supply during recent procedure at [facility]. If patient returns call, please transfer to Triage RN.

## 2020-05-02 ENCOUNTER — Inpatient Hospital Stay: Payer: Medicare (Managed Care)

## 2020-05-02 ENCOUNTER — Inpatient Hospital Stay: Payer: Medicare (Managed Care) | Admitting: Internal Medicine

## 2020-05-03 ENCOUNTER — Encounter: Payer: Self-pay | Admitting: Gastroenterology

## 2020-05-04 ENCOUNTER — Inpatient Hospital Stay: Payer: Medicare (Managed Care)

## 2020-05-04 ENCOUNTER — Inpatient Hospital Stay: Payer: Medicare (Managed Care) | Attending: Internal Medicine

## 2020-05-04 ENCOUNTER — Inpatient Hospital Stay (HOSPITAL_BASED_OUTPATIENT_CLINIC_OR_DEPARTMENT_OTHER): Payer: Medicare (Managed Care) | Admitting: Internal Medicine

## 2020-05-04 ENCOUNTER — Telehealth: Payer: Self-pay

## 2020-05-04 VITALS — BP 119/72 | HR 78 | Temp 96.3°F | Resp 16 | Ht 66.0 in | Wt 122.4 lb

## 2020-05-04 DIAGNOSIS — Z923 Personal history of irradiation: Secondary | ICD-10-CM | POA: Diagnosis not present

## 2020-05-04 DIAGNOSIS — R634 Abnormal weight loss: Secondary | ICD-10-CM | POA: Insufficient documentation

## 2020-05-04 DIAGNOSIS — Z170 Estrogen receptor positive status [ER+]: Secondary | ICD-10-CM | POA: Insufficient documentation

## 2020-05-04 DIAGNOSIS — Z79899 Other long term (current) drug therapy: Secondary | ICD-10-CM | POA: Diagnosis not present

## 2020-05-04 DIAGNOSIS — Z87891 Personal history of nicotine dependence: Secondary | ICD-10-CM | POA: Insufficient documentation

## 2020-05-04 DIAGNOSIS — N184 Chronic kidney disease, stage 4 (severe): Secondary | ICD-10-CM

## 2020-05-04 DIAGNOSIS — I130 Hypertensive heart and chronic kidney disease with heart failure and stage 1 through stage 4 chronic kidney disease, or unspecified chronic kidney disease: Secondary | ICD-10-CM | POA: Insufficient documentation

## 2020-05-04 DIAGNOSIS — Z9981 Dependence on supplemental oxygen: Secondary | ICD-10-CM | POA: Insufficient documentation

## 2020-05-04 DIAGNOSIS — Z803 Family history of malignant neoplasm of breast: Secondary | ICD-10-CM | POA: Insufficient documentation

## 2020-05-04 DIAGNOSIS — J449 Chronic obstructive pulmonary disease, unspecified: Secondary | ICD-10-CM | POA: Insufficient documentation

## 2020-05-04 DIAGNOSIS — F418 Other specified anxiety disorders: Secondary | ICD-10-CM | POA: Diagnosis not present

## 2020-05-04 DIAGNOSIS — D631 Anemia in chronic kidney disease: Secondary | ICD-10-CM

## 2020-05-04 DIAGNOSIS — Z9221 Personal history of antineoplastic chemotherapy: Secondary | ICD-10-CM | POA: Diagnosis not present

## 2020-05-04 DIAGNOSIS — D649 Anemia, unspecified: Secondary | ICD-10-CM | POA: Diagnosis not present

## 2020-05-04 DIAGNOSIS — C3411 Malignant neoplasm of upper lobe, right bronchus or lung: Secondary | ICD-10-CM | POA: Insufficient documentation

## 2020-05-04 DIAGNOSIS — C155 Malignant neoplasm of lower third of esophagus: Secondary | ICD-10-CM | POA: Insufficient documentation

## 2020-05-04 DIAGNOSIS — C50812 Malignant neoplasm of overlapping sites of left female breast: Secondary | ICD-10-CM | POA: Diagnosis not present

## 2020-05-04 DIAGNOSIS — R630 Anorexia: Secondary | ICD-10-CM | POA: Insufficient documentation

## 2020-05-04 LAB — BASIC METABOLIC PANEL
Anion gap: 10 (ref 5–15)
BUN: 52 mg/dL — ABNORMAL HIGH (ref 8–23)
CO2: 15 mmol/L — ABNORMAL LOW (ref 22–32)
Calcium: 9.9 mg/dL (ref 8.9–10.3)
Chloride: 111 mmol/L (ref 98–111)
Creatinine, Ser: 2.32 mg/dL — ABNORMAL HIGH (ref 0.44–1.00)
GFR calc Af Amer: 25 mL/min — ABNORMAL LOW (ref >60)
GFR calc non Af Amer: 21 mL/min — ABNORMAL LOW (ref >60)
Glucose, Bld: 113 mg/dL — ABNORMAL HIGH (ref 70–99)
Potassium: 4.7 mmol/L (ref 3.5–5.1)
Sodium: 136 mmol/L (ref 135–145)

## 2020-05-04 LAB — CBC WITH DIFFERENTIAL/PLATELET
Abs Immature Granulocytes: 0.02 10*3/uL (ref 0.00–0.07)
Basophils Absolute: 0.1 10*3/uL (ref 0.0–0.1)
Basophils Relative: 1 %
Eosinophils Absolute: 0.2 10*3/uL (ref 0.0–0.5)
Eosinophils Relative: 3 %
HCT: 29.7 % — ABNORMAL LOW (ref 36.0–46.0)
Hemoglobin: 10.2 g/dL — ABNORMAL LOW (ref 12.0–15.0)
Immature Granulocytes: 0 %
Lymphocytes Relative: 21 %
Lymphs Abs: 1.3 10*3/uL (ref 0.7–4.0)
MCH: 33.4 pg (ref 26.0–34.0)
MCHC: 34.3 g/dL (ref 30.0–36.0)
MCV: 97.4 fL (ref 80.0–100.0)
Monocytes Absolute: 0.6 10*3/uL (ref 0.1–1.0)
Monocytes Relative: 9 %
Neutro Abs: 4.1 10*3/uL (ref 1.7–7.7)
Neutrophils Relative %: 66 %
Platelets: 251 10*3/uL (ref 150–400)
RBC: 3.05 MIL/uL — ABNORMAL LOW (ref 3.87–5.11)
RDW: 13.2 % (ref 11.5–15.5)
WBC: 6.2 10*3/uL (ref 4.0–10.5)
nRBC: 0 % (ref 0.0–0.2)

## 2020-05-04 LAB — IRON AND TIBC
Iron: 134 ug/dL (ref 28–170)
Saturation Ratios: 44 % — ABNORMAL HIGH (ref 10.4–31.8)
TIBC: 307 ug/dL (ref 250–450)
UIBC: 173 ug/dL

## 2020-05-04 LAB — FERRITIN: Ferritin: 426 ng/mL — ABNORMAL HIGH (ref 11–307)

## 2020-05-04 NOTE — Telephone Encounter (Signed)
-----   Message from Jonathon Bellows, MD sent at 05/03/2020 10:54 AM EDT ----- Sherald Hess inform biopsies show " No cancer" show only normal tissue- repeat EGD in 4 months

## 2020-05-04 NOTE — Telephone Encounter (Signed)
Pt has been notified of results and Dr. Anna's recommendations. 

## 2020-05-04 NOTE — Progress Notes (Signed)
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 A. LUNG MASS, RIGHT; CT-GUIDED BIOPSY: - ADENOCARCINOMA, ACINAR AND PAPILLARY MORPHOLOGIES. S/p RT [finished Dec 2017] s/p RT [DEC 2017]; AUG 2019- PET-right middle lobe radiation changes no evidence of recurrence.  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  T1b- submucosal involvement; N0- stage I [[name]; [facility]]; unable to proceed with Endo mucosal resection-because of extensive disease [Dr.Branch; [facility]]; recommend definitive radiation [finished  RT- 09/21/2019]   #September 2020 worsening macrocytic anemia--bone marrow biopsy shows plasma cells up to 5%; unlikely to explain patient's anemia.  MDS is still a possibility-if anemia worsens would recommend peripheral blood NGS.  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]  # 2019-July-aug 2019-ischemic colitis status post right  hemicolectomy [Dr.[name]]; acute renal failure;  -----------------------------------------------------------------    DIAGNOSIS: RML Lung ca adeno [stage I] #squamous cell carcinoma esophagus -stage I   CURRENT/MOST RECENT THERAPY: Status post radiation       Breast cancer in female [name]  07/24/1999 Initial Diagnosis   Breast cancer in female ([place]) T3 N1 M0 tumor ER/PR positive   08/24/1999 -  Anti-estrogen oral therapy   Started tamoxifen   08/24/1999 Surgery   Status post modified radical mastectomy of left breast    Chemotherapy      Radiation Therapy     11/24/2003 - 02/20/2009 Anti-estrogen oral therapy   Started Aromasin   Carcinoma of overlapping sites of left breast in female, estrogen receptor positive ([place])  06/22/2016 Initial Diagnosis   Cancer of overlapping sites of left female breast ([place])   Malignant neoplasm of upper lobe of right lung ([place])  Malignant neoplasm of lower third of esophagus ([place])  05/01/2019 Initial Diagnosis   Malignant neoplasm of lower third of esophagus ([place])     INTERVAL HISTORY:  Danielle [name] 64 y.o.  female pleasant patient above history of multiple malignancies including most recent stage I squamous cell cancer of esophagus currently status post radiation; and history of severe anemia is here for follow-up.  In the interim patient had a repeat upper endoscopy-on July 15.  Denies any worsening swallowing.  Patient has not had any repeat hospitalizations.  Denies any nausea vomiting.  However complains of poor appetite positive weight loss.  Review of Systems  Constitutional: Positive for malaise/fatigue and weight loss. Negative for chills, diaphoresis and fever.  HENT: Negative for nosebleeds and sore throat.   Eyes: Negative for double vision.  Respiratory: Positive for cough and  shortness of breath. Negative for hemoptysis, sputum production and wheezing.   Cardiovascular: Negative for chest pain, palpitations, orthopnea and leg  swelling.  Gastrointestinal: Negative for abdominal pain, blood in stool, constipation, heartburn, melena and vomiting.  Genitourinary: Negative for dysuria, frequency and urgency.  Musculoskeletal: Negative for back pain and joint pain.  Skin: Negative.  Negative for itching and rash.  Neurological: Negative for tingling, focal weakness, weakness and headaches.  Endo/Heme/Allergies: Does not bruise/bleed easily.  Psychiatric/Behavioral: Negative for depression. The patient is not nervous/anxious and does not have insomnia.       PAST MEDICAL HISTORY :  Past Medical History:  Diagnosis Date  . Anxiety   . Arthritis   . Breast cancer ([place]) 2000  . Cancer ([place]) left    breast cancer 2000, chemo tx's with total mastectomy and lymph nodes resected.   . Cancer of right lung ([place]) 02/21/2016   rad tx's.   . CHF (congestive heart failure) ([place])   . COPD (chronic obstructive pulmonary disease) ([place])   . Dependence on supplemental oxygen   . Depression   . Heart murmur   . Hypertension   . Lung nodule   . Lymphedema   . Personal history of chemotherapy   . Personal history of radiation therapy   . Shortness of breath dyspnea    with exertion  . Status post chemotherapy 2001   left breast cancer  . Status post radiation therapy 2001   left breast cancer    PAST SURGICAL HISTORY :   Past Surgical History:  Procedure Laterality Date  . Breast Biospy Left    [facility]  . BREAST SURGERY    . COLONOSCOPY N/A 04/30/2018   Procedure: COLONOSCOPY;  Surgeon: Virgel Manifold, MD;  Location: [facility] ENDOSCOPY;  Service: Endoscopy;  Laterality: N/A;  . COLONOSCOPY N/A 07/22/2018   Procedure: COLONOSCOPY;  Surgeon: Virgel Manifold, MD;  Location: [facility] ENDOSCOPY;  Service: Endoscopy;  Laterality: N/A;  . DILATION AND CURETTAGE OF UTERUS    . ELECTROMAGNETIC NAVIGATION BROCHOSCOPY Right 04/11/2016   Procedure: ELECTROMAGNETIC NAVIGATION BRONCHOSCOPY;  Surgeon: Vilinda Boehringer, MD;  Location:  [facility] ORS;  Service: Cardiopulmonary;  Laterality: Right;  . ESOPHAGOGASTRODUODENOSCOPY N/A 07/22/2018   Procedure: ESOPHAGOGASTRODUODENOSCOPY (EGD);  Surgeon: Virgel Manifold, MD;  Location: [facility] ENDOSCOPY;  Service: Endoscopy;  Laterality: N/A;  . ESOPHAGOGASTRODUODENOSCOPY (EGD) WITH PROPOFOL N/A 05/07/2018   Procedure: ESOPHAGOGASTRODUODENOSCOPY (EGD) WITH PROPOFOL;  Surgeon: Lucilla Lame, MD;  Location: [facility] ENDOSCOPY;  Service: Endoscopy;  Laterality: N/A;  . ESOPHAGOGASTRODUODENOSCOPY (EGD) WITH PROPOFOL N/A 04/24/2019   Procedure: ESOPHAGOGASTRODUODENOSCOPY (EGD) WITH PROPOFOL;  Surgeon: Jonathon Bellows, MD;  Location[facility] ENDOSCOPY;  Service: Gastroenterology;  Laterality: N/A;  . ESOPHAGOGASTRODUODENOSCOPY (EGD) WITH PROPOFOL N/A 01/12/2020   Procedure: ESOPHAGOGASTRODUODENOSCOPY (EGD) WITH PROPOFOL;  Surgeon: Jonathon Bellows, MD;  Location: [facility] ENDOSCOPY;  Service: Gastroenterology;  Laterality: N/A;  . ESOPHAGOGASTRODUODENOSCOPY (EGD) WITH PROPOFOL N/A 04/28/2020   Procedure: ESOPHAGOGASTRODUODENOSCOPY (EGD) WITH PROPOFOL;  Surgeon: Jonathon Bellows, MD;  Location: [facility] ENDOSCOPY;  Service: Gastroenterology;  Laterality: N/A;  . EUS N/A 05/07/2019   Procedure: FULL UPPER ENDOSCOPIC ULTRASOUND (EUS) RADIAL;  Surgeon: Jola Schmidt, MD;  Location: [facility] ENDOSCOPY;  Service: Endoscopy;  Laterality: N/A;  . history of colonoscopy]    . ILEOSCOPY N/A 07/22/2018   Procedure: ILEOSCOPY THROUGH STOMA;  Surgeon: Virgel Manifold, MD;  Location: [facility] ENDOSCOPY;  Service: Endoscopy;  Laterality: N/A;  . ILEOSTOMY    . ILEOSTOMY N/A 09/08/2018   Procedure: ILEOSTOMY REVISION POSSIBLE CREATION;  Surgeon: Carolan Shiver, MD;  Location: [facility] ORS;  Service: General;  Laterality: N/A;  . ILEOSTOMY CLOSURE N/A 08/15/2018   Procedure: DILATION OF ILEOSTOMY STRICTURE;  Surgeon: Carolan Shiver, MD;  Location: [facility] ORS;  Service: General;  Laterality: N/A;  . LAPAROTOMY Right 05/04/2018   Procedure:  EXPLORATORY LAPAROTOMY right colectomy right and left ostomy;  Surgeon: Carolan Shiver, MD;  Location: [facility] ORS;  Service: General;  Laterality: Right;  . LUNG BIOPSY    . MASTECTOMY Left    2000, [facility]  . ROTATOR CUFF REPAIR Right    [facility]    FAMILY HISTORY :   Family History  Problem Relation Age of Onset  . Breast cancer Mother 20  . Cancer Mother        Breast   . Cirrhosis Father   . Breast cancer Paternal Aunt 43  . Cancer Maternal Aunt        Breast     SOCIAL HISTORY:   Social History   Tobacco Use  . Smoking status: Former Smoker    Packs/day: 0.50    Years: 20.00    Pack years: 10.00    Types: Cigarettes    Quit date: 07/02/2012    Years since quitting: 7.8  . Smokeless tobacco: Current User    Types: Snuff  . Tobacco comment: quit 2014  Vaping Use  . Vaping Use: Never used  Substance Use Topics  . Alcohol use: Not Currently    Comment: Occasionally  . Drug use: No    ALLERGIES:  has No Known Allergies.  MEDICATIONS:  Current Outpatient Medications  Medication Sig Dispense Refill  . acetaminophen (TYLENOL) 325 MG tablet Take 2 tablets (650 mg total) by mouth every 6 (six) hours as needed for mild pain (or Fever >/= 101).    [name] albuterol (PROVENTIL HFA;VENTOLIN HFA) 108 (90 Base) MCG/ACT inhaler Inhale 2 puffs into the lungs every 6 (six) hours as needed for wheezing or shortness of breath. 1 Inhaler 2  . amLODipine (NORVASC) 5 MG tablet Take by mouth.    . calcium carbonate (TUMS - DOSED IN MG ELEMENTAL CALCIUM) 500 MG chewable tablet Chew 0.5 tablets (100 mg of elemental calcium total) by mouth daily.    . calcium-vitamin D (OSCAL WITH D) 250-125 MG-UNIT tablet Take 1 tablet by mouth daily.    . citalopram (CELEXA) 40 MG tablet Take 40 mg by mouth daily.     . ferrous sulfate 325 (65 FE) MG tablet Take 1 tablet (325 mg total) by mouth 2 (two) times daily with a meal. 60 tablet 3  . Fluticasone-Umeclidin-Vilant 100-62.5-25 MCG/INH AEPB Inhale 1 puff  into the lungs daily.     [name] guaiFENesin-dextromethorphan (ROBITUSSIN DM) 100-10 MG/5ML syrup Take 5 mLs by mouth every 4 (four) hours as needed for cough.    . loperamide (IMODIUM A-D) 2 MG tablet Take 1 tablet (2 mg total) by mouth as needed for diarrhea or loose stools. 30 tablet 0  . Multiple Vitamin (MULTIVITAMIN WITH MINERALS) TABS tablet Take 1 tablet by mouth daily. 30 tablet 1  . Nutritional Supplements (FEEDING SUPPLEMENT, NEPRO CARB STEADY,) LIQD Take 237 mLs by mouth 3 (three) times daily between meals. 21330 mL 0  . pantoprazole (PROTONIX) 40 MG tablet Take 1 tablet (40 mg total) by mouth 2 (two) times daily before a meal. 60 tablet 0  . sodium bicarbonate 650 MG tablet Take 2 tablets (1,300 mg total) by mouth 3 (three) times daily. 180 tablet 0  .  sucralfate (CARAFATE) 1 GM/10ML suspension Take 10 mLs (1 g total) by mouth 4 (four) times daily. 1200 mL 2  . torsemide (DEMADEX) 20 MG tablet Take 1 tablet by mouth daily as needed (swelling).     . carvedilol (COREG) 6.25 MG tablet Take 1 tablet (6.25 mg total) by mouth 2 (two) times daily with a meal. 60 tablet 0   No current facility-administered medications for this visit.    PHYSICAL EXAMINATION: ECOG PERFORMANCE STATUS: 1 - Symptomatic but completely ambulatory  BP 119/72   Pulse 78   Temp (!) 96.3 F (35.7 C) (Tympanic)   Resp 16   Ht '5\' 6"'$  (1.676 m)   Wt 122 lb 6.4 oz (55.5 kg)   SpO2 97%   BMI 19.76 kg/m   Filed Weights   05/04/20 0932  Weight: 122 lb 6.4 oz (55.5 kg)    Physical Exam Constitutional:      Comments: Thin built moderately nourished female patient.  She is walking herself.  She is alone.  HENT:     Head: Normocephalic and atraumatic.     Mouth/Throat:     Pharynx: No oropharyngeal exudate.  Eyes:     Pupils: Pupils are equal, round, and reactive to light.  Cardiovascular:     Rate and Rhythm: Normal rate and regular rhythm.  Pulmonary:     Effort: No respiratory distress.     Breath sounds:  No wheezing.  Abdominal:     General: Bowel sounds are normal. There is no distension.     Palpations: Abdomen is soft. There is no mass.     Tenderness: There is no abdominal tenderness. There is no guarding or rebound.  Musculoskeletal:        General: No tenderness. Normal range of motion.     Cervical back: Normal range of motion and neck supple.     Comments: Left upper extremity-swollen/no warmth or tenderness.   Skin:    General: Skin is warm.  Neurological:     Mental Status: She is alert and oriented to person, place, and time.  Psychiatric:        Mood and Affect: Affect normal.        LABORATORY DATA:  I have reviewed the data as listed    Component Value Date/Time   NA 136 05/04/2020 0911   NA 132 (L) 02/09/2015 1100   K 4.7 05/04/2020 0911   K 3.8 02/09/2015 1100   CL 111 05/04/2020 0911   CL 95 (L) 02/09/2015 1100   CO2 15 (L) 05/04/2020 0911   CO2 29 02/09/2015 1100   GLUCOSE 113 (H) 05/04/2020 0911   GLUCOSE 105 (H) 02/09/2015 1100   BUN 52 (H) 05/04/2020 0911   BUN 16 02/09/2015 1100   CREATININE 2.32 (H) 05/04/2020 0911   CREATININE 0.81 02/09/2015 1100   CALCIUM 9.9 05/04/2020 0911   CALCIUM 9.1 02/09/2015 1100   PROT 7.3 01/20/2020 0502   PROT 7.7 02/09/2015 1100   ALBUMIN 3.6 01/20/2020 0502   ALBUMIN 4.3 02/09/2015 1100   AST 40 01/20/2020 0502   AST 29 02/09/2015 1100   ALT 22 01/20/2020 0502   ALT 20 02/09/2015 1100   ALKPHOS 67 01/20/2020 0502   ALKPHOS 69 02/09/2015 1100   BILITOT 1.2 01/20/2020 0502   BILITOT 0.9 02/09/2015 1100   GFRNONAA 21 (L) 05/04/2020 0911   GFRNONAA >60 02/09/2015 1100   GFRAA 25 (L) 05/04/2020 0911   GFRAA >60 02/09/2015 1100    No results  found for: SPEP, UPEP  Lab Results  Component Value Date   WBC 6.2 05/04/2020   NEUTROABS 4.1 05/04/2020   HGB 10.2 (L) 05/04/2020   HCT 29.7 (L) 05/04/2020   MCV 97.4 05/04/2020   PLT 251 05/04/2020      Chemistry      Component Value Date/Time   NA 136  05/04/2020 0911   NA 132 (L) 02/09/2015 1100   K 4.7 05/04/2020 0911   K 3.8 02/09/2015 1100   CL 111 05/04/2020 0911   CL 95 (L) 02/09/2015 1100   CO2 15 (L) 05/04/2020 0911   CO2 29 02/09/2015 1100   BUN 52 (H) 05/04/2020 0911   BUN 16 02/09/2015 1100   CREATININE 2.32 (H) 05/04/2020 0911   CREATININE 0.81 02/09/2015 1100      Component Value Date/Time   CALCIUM 9.9 05/04/2020 0911   CALCIUM 9.1 02/09/2015 1100   ALKPHOS 67 01/20/2020 0502   ALKPHOS 69 02/09/2015 1100   AST 40 01/20/2020 0502   AST 29 02/09/2015 1100   ALT 22 01/20/2020 0502   ALT 20 02/09/2015 1100   BILITOT 1.2 01/20/2020 0502   BILITOT 0.9 02/09/2015 1100       RADIOGRAPHIC STUDIES: I have personally reviewed the radiological images as listed and agreed with the findings in the report. No results found.   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  Currently s/p definitive radiation [dec 7th 2020].  STABLE;   Posttreatment PET scan February 2021-no evidence of any metastatic disease.  No uptake in the esophagus region. EGD July 15 th- NO evidence of recurrence. STABLE.  Continue surveillance.  #Severe anemia-likely secondary to CKD stage IV-currently on Retacrit; hemoglobin today is 10.3 HOLD; Retacrit today.   #CKD stage IV-clinically STABLE. 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  Stable we will repeat myeloma labs in 2 months.  #Weight loss/poor appetite-unclear etiology; will refer to nutrition.  # DISPOSITION:   # referral to [facility] re: weight loss  # HOLD RETACRIT today  # in 4 weeks- H&H- possible retacrit  # follow up in 8 weeks- cbc/bmp MM panel; K/L light chains-; possible Retacrit;-Dr.B  Addendum: Above plan of care  was discussed with the patient's daughter Ms.King; agrees with the plan.     Orders Placed This Encounter  Procedures  . CBC with Differential    Standing Status:   Future    Standing Expiration Date:   05/04/2021  . Multiple Myeloma Panel (SPEP&IFE w/QIG)    Standing Status:   Future    Standing Expiration Date:   05/04/2021  . Kappa/lambda light chains    Standing Status:   Future    Standing Expiration Date:   05/04/2021  . Basic metabolic panel    Standing Status:   Future    Standing Expiration Date:   05/04/2021  . Hematocrit ([facility])    Standing Status:   Future    Standing Expiration Date:   05/04/2021  . Hemoglobin [facility])    Standing Status:   Future    Standing Expiration Date:   05/04/2021  . Amb Referral to Nutrition and Diabetic Education    Referral Priority:   Routine    Referral Type:   Consultation    Referral  Reason:   Specialty Services Required    Number of Visits Requested:   1   All questions were answered. The patient knows to call the clinic with any problems, questions or concerns.      [name], MD 05/04/2020 7:24 PM

## 2020-05-04 NOTE — Assessment & Plan Note (Addendum)
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  Currently s/p definitive radiation [dec 7th 2020].  STABLE;   Posttreatment PET scan February 2021-no evidence of any metastatic disease.  No uptake in the esophagus region. EGD July 15 th- NO evidence of recurrence. STABLE.  Continue surveillance.  #Severe anemia-likely secondary to CKD stage IV-currently on Retacrit; hemoglobin today is 10.3 HOLD; Retacrit today.   #CKD stage IV-clinically STABLE. 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  Stable we will repeat myeloma labs in 2 months.  #Weight loss/poor appetite-unclear etiology; will refer to nutrition.  # DISPOSITION:   # referral to [facility] re: weight loss  # HOLD RETACRIT today  # in 4 weeks- H&H- possible retacrit  # follow up in 8 weeks- cbc/bmp MM panel; K/L light chains-; possible Retacrit;-Dr.B  Addendum: Above plan of care was discussed with the patient's daughter Ms.King; agrees with the plan.

## 2020-05-16 ENCOUNTER — Telehealth: Payer: Self-pay | Admitting: Family

## 2020-05-16 ENCOUNTER — Ambulatory Visit: Payer: Medicare Other | Admitting: Family

## 2020-05-16 ENCOUNTER — Telehealth: Payer: Self-pay

## 2020-05-16 ENCOUNTER — Inpatient Hospital Stay: Payer: Medicare (Managed Care) | Attending: Internal Medicine

## 2020-05-16 DIAGNOSIS — I129 Hypertensive chronic kidney disease with stage 1 through stage 4 chronic kidney disease, or unspecified chronic kidney disease: Secondary | ICD-10-CM | POA: Insufficient documentation

## 2020-05-16 DIAGNOSIS — D631 Anemia in chronic kidney disease: Secondary | ICD-10-CM | POA: Insufficient documentation

## 2020-05-16 DIAGNOSIS — N184 Chronic kidney disease, stage 4 (severe): Secondary | ICD-10-CM | POA: Insufficient documentation

## 2020-05-16 DIAGNOSIS — Z79899 Other long term (current) drug therapy: Secondary | ICD-10-CM | POA: Insufficient documentation

## 2020-05-16 NOTE — Telephone Encounter (Signed)
Patient did not show for her [facility] appointment on 05/16/20. Will attempt to reschedule.

## 2020-05-16 NOTE — Telephone Encounter (Signed)
Nutrition  Patient was scheduled for nutrition phone visit.  RD unable to reach patient by phone.  Left message with call back number.  Message sent to provider and to scheduling to offer another appointment.  Danielle [name], [place], [place] Registered Dietitian [phone] (mobile)

## 2020-06-01 ENCOUNTER — Other Ambulatory Visit: Payer: Self-pay

## 2020-06-01 ENCOUNTER — Inpatient Hospital Stay: Payer: Medicare (Managed Care)

## 2020-06-01 VITALS — BP 132/74 | HR 84

## 2020-06-01 DIAGNOSIS — D631 Anemia in chronic kidney disease: Secondary | ICD-10-CM | POA: Diagnosis not present

## 2020-06-01 DIAGNOSIS — N184 Chronic kidney disease, stage 4 (severe): Secondary | ICD-10-CM | POA: Diagnosis not present

## 2020-06-01 DIAGNOSIS — I129 Hypertensive chronic kidney disease with stage 1 through stage 4 chronic kidney disease, or unspecified chronic kidney disease: Secondary | ICD-10-CM | POA: Diagnosis not present

## 2020-06-01 DIAGNOSIS — Z79899 Other long term (current) drug therapy: Secondary | ICD-10-CM | POA: Diagnosis not present

## 2020-06-01 DIAGNOSIS — C155 Malignant neoplasm of lower third of esophagus: Secondary | ICD-10-CM

## 2020-06-01 LAB — HEMATOCRIT: HCT: 27 % — ABNORMAL LOW (ref 36.0–46.0)

## 2020-06-01 LAB — HEMOGLOBIN: Hemoglobin: 9.3 g/dL — ABNORMAL LOW (ref 12.0–15.0)

## 2020-06-01 MED ORDER — EPOETIN ALFA-EPBX 10000 UNIT/ML IJ SOLN
20000.0000 [IU] | Freq: Once | INTRAMUSCULAR | Status: AC
  Administered 2020-06-01: 20000 [IU] via SUBCUTANEOUS
  Filled 2020-06-01: qty 2

## 2020-06-01 MED ORDER — EPOETIN ALFA-EPBX 40000 UNIT/ML IJ SOLN: 20000.0000 [IU] | Freq: Once | INTRAMUSCULAR | Status: DC

## 2020-06-05 ENCOUNTER — Other Ambulatory Visit: Payer: Self-pay

## 2020-06-05 ENCOUNTER — Emergency Department: Payer: Medicare (Managed Care)

## 2020-06-05 ENCOUNTER — Encounter: Payer: Self-pay | Admitting: Emergency Medicine

## 2020-06-05 ENCOUNTER — Emergency Department
Admission: EM | Admit: 2020-06-05 | Discharge: 2020-06-05 | Disposition: A | Discharge status: Home / Self Care | Payer: Medicare (Managed Care) | Attending: Emergency Medicine | Admitting: Emergency Medicine

## 2020-06-05 DIAGNOSIS — Y929 Unspecified place or not applicable: Secondary | ICD-10-CM | POA: Insufficient documentation

## 2020-06-05 DIAGNOSIS — N184 Chronic kidney disease, stage 4 (severe): Secondary | ICD-10-CM | POA: Insufficient documentation

## 2020-06-05 DIAGNOSIS — S0990XA Unspecified injury of head, initial encounter: Secondary | ICD-10-CM | POA: Diagnosis not present

## 2020-06-05 DIAGNOSIS — S6991XA Unspecified injury of right wrist, hand and finger(s), initial encounter: Secondary | ICD-10-CM | POA: Diagnosis present

## 2020-06-05 DIAGNOSIS — S63292A Dislocation of distal interphalangeal joint of right middle finger, initial encounter: Secondary | ICD-10-CM | POA: Diagnosis not present

## 2020-06-05 DIAGNOSIS — Y939 Activity, unspecified: Secondary | ICD-10-CM | POA: Diagnosis not present

## 2020-06-05 DIAGNOSIS — S01112A Laceration without foreign body of left eyelid and periocular area, initial encounter: Secondary | ICD-10-CM | POA: Diagnosis not present

## 2020-06-05 DIAGNOSIS — S63279A Dislocation of unspecified interphalangeal joint of unspecified finger, initial encounter: Secondary | ICD-10-CM

## 2020-06-05 DIAGNOSIS — J449 Chronic obstructive pulmonary disease, unspecified: Secondary | ICD-10-CM | POA: Insufficient documentation

## 2020-06-05 DIAGNOSIS — I130 Hypertensive heart and chronic kidney disease with heart failure and stage 1 through stage 4 chronic kidney disease, or unspecified chronic kidney disease: Secondary | ICD-10-CM | POA: Insufficient documentation

## 2020-06-05 DIAGNOSIS — Y999 Unspecified external cause status: Secondary | ICD-10-CM | POA: Insufficient documentation

## 2020-06-05 DIAGNOSIS — Z87891 Personal history of nicotine dependence: Secondary | ICD-10-CM | POA: Diagnosis not present

## 2020-06-05 DIAGNOSIS — I5032 Chronic diastolic (congestive) heart failure: Secondary | ICD-10-CM | POA: Insufficient documentation

## 2020-06-05 DIAGNOSIS — I700 Atherosclerosis of aorta: Secondary | ICD-10-CM | POA: Diagnosis not present

## 2020-06-05 DIAGNOSIS — W1839XA Other fall on same level, initial encounter: Secondary | ICD-10-CM | POA: Insufficient documentation

## 2020-06-05 DIAGNOSIS — T148XXA Other injury of unspecified body region, initial encounter: Secondary | ICD-10-CM

## 2020-06-05 DIAGNOSIS — R0781 Pleurodynia: Secondary | ICD-10-CM | POA: Diagnosis not present

## 2020-06-05 MED ORDER — BACITRACIN-NEOMYCIN-POLYMYXIN 400-5-5000 EX OINT
TOPICAL_OINTMENT | Freq: Once | CUTANEOUS | Status: AC
  Filled 2020-06-05: qty 1

## 2020-06-05 MED ORDER — LIDOCAINE HCL (PF) 1 % IJ SOLN
5.0000 mL | Freq: Once | INTRAMUSCULAR | Status: DC
  Filled 2020-06-05: qty 5

## 2020-06-05 NOTE — ED Triage Notes (Signed)
Pt presents via acems with c/o mechanical fall. Pt has noted deformity to right middle finger. Pt also with laceration right above left eyebrow. Pt denies LOC or use of blood thinners. Pt currently alert. Endorses etoh

## 2020-06-05 NOTE — Discharge Instructions (Addendum)
Wear the splint until you follow up with primary care.

## 2020-06-05 NOTE — ED Provider Notes (Signed)
[facility] Emergency Department Provider Note ____________________________________________   First MD Initiated Contact with Patient 06/05/20 1853     (approximate)  I have reviewed the triage vital signs and the nursing notes.   HISTORY  Chief Complaint Fall  HPI Danielle [name] is a 64 y.o. female with history of COPD, CHF, breast and lung CA presents to the emergency department for treatment and evaluation after nonsyncopal fall at home prior to arrival.  Obvious deformity of the right middle finger and laceration over the left eye.  She also complains of tenderness over the left lateral chest/rib area.  Tdap is current.         Past Medical History:  Diagnosis Date   Anxiety    Arthritis    Breast cancer ([place]) 2000   Cancer ([place]) left    breast cancer 2000, chemo tx's with total mastectomy and lymph nodes resected.    Cancer of right lung ([place]) 02/21/2016   rad tx's.    CHF (congestive heart failure) (HCC)    COPD (chronic obstructive pulmonary disease) (HCC)    Dependence on supplemental oxygen    Depression    Heart murmur    Hypertension    Lung nodule    Lymphedema    Personal history of chemotherapy    Personal history of radiation therapy    Shortness of breath dyspnea    with exertion   Status post chemotherapy 2001   left breast cancer   Status post radiation therapy 2001   left breast cancer    Patient Active Problem List   Diagnosis Date Noted   Cellulitis of left arm 01/20/2020   CKD (chronic kidney disease), stage IV ([place]) 01/20/2020   Hypokalemia 01/20/2020   Elevated troponin 01/20/2020   Sepsis ([place]) 01/20/2020   Chronic diastolic CHF (congestive heart failure) ([place]) 01/20/2020   CHF exacerbation ([place]) 11/06/2019   Diarrhea    Acute renal failure superimposed on stage 3b chronic kidney disease ([name]) 10/29/2019   GERD (gastroesophageal reflux disease) 10/29/2019   Depression 10/29/2019     Hyperkalemia 10/14/2019   History of 2019 novel coronavirus disease (COVID-19) 10/14/2019   Acute respiratory failure due to COVID-19 ([place]) 09/27/2019   Respiratory tract infection due to COVID-19 virus 09/27/2019   Acute blood loss anemia 06/20/2019   Anemia 06/20/2019   GI bleeding 06/19/2019   Anemia of chronic kidney failure, stage 4 (severe) ([place]) 06/12/2019   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   Acute on chronic respiratory failure with hypoxia and hypercapnia ([place]) 12/22/2018   Ileostomy dysfunction ([place]) 09/28/2018   Reflux esophagitis    Iron deficiency anemia secondary to blood loss (chronic)    SBO (small bowel obstruction) ([place]) 66/59/9357   Chronic systolic heart failure ([place]) 06/09/2018   HTN (hypertension) 06/09/2018   Atrial fibrillation ([place]) 06/09/2018   Lymphedema 06/09/2018   Acute kidney injury superimposed on CKD ([place]) 06/04/2018   Severe protein-calorie malnutrition ([place]) 06/04/2018   Blood in stool    Focal (segmental) acute (reversible) ischemia of large intestine (HCC)    Ulceration of intestine    Diverticulosis of large intestine without diverticulitis    Abnormal CT scan, colon    Colitis    COPD exacerbation ([place])    Malnutrition of moderate degree 04/23/2018   Palliative care by specialist    DNR (do not resuscitate) discussion    Weakness generalized    Hyponatremia 02/10/2017   Syncope 02/09/2017  Lung nodule, solitary 07/25/2016   Malignant neoplasm of upper lobe of right lung ([place]) 07/25/2016   Carcinoma of overlapping sites of left breast in female, estrogen receptor positive ([place]) 06/22/2016   Multiple lung nodules 06/22/2016   Lesion of right lung    Breast cancer in female ([place]) 02/21/2016   Moderate COPD (chronic obstructive pulmonary disease) ([place]) 08/24/2014    Past Surgical History:  Procedure Laterality Date   Breast Biospy Left    [facility]   BREAST SURGERY      COLONOSCOPY N/A 04/30/2018   Procedure: COLONOSCOPY;  Surgeon: Virgel Manifold, MD;  Location: [facility] ENDOSCOPY;  Service: Endoscopy;  Laterality: N/A;   COLONOSCOPY N/A 07/22/2018   Procedure: COLONOSCOPY;  Surgeon: Virgel Manifold, MD;  Location: [facility] ENDOSCOPY;  Service: Endoscopy;  Laterality: N/A;   DILATION AND CURETTAGE OF UTERUS     ELECTROMAGNETIC NAVIGATION BROCHOSCOPY Right 04/11/2016   Procedure: ELECTROMAGNETIC NAVIGATION BRONCHOSCOPY;  Surgeon: Vilinda Boehringer, MD;  Location: [facility] ORS;  Service: Cardiopulmonary;  Laterality: Right;   ESOPHAGOGASTRODUODENOSCOPY N/A 07/22/2018   Procedure: ESOPHAGOGASTRODUODENOSCOPY (EGD);  Surgeon: Virgel Manifold, MD;  Location: [facility] ENDOSCOPY;  Service: Endoscopy;  Laterality: N/A;   ESOPHAGOGASTRODUODENOSCOPY (EGD) WITH PROPOFOL N/A 05/07/2018   Procedure: ESOPHAGOGASTRODUODENOSCOPY (EGD) WITH PROPOFOL;  Surgeon: Lucilla Lame, MD;  Location: [facility] ENDOSCOPY;  Service: Endoscopy;  Laterality: N/A;   ESOPHAGOGASTRODUODENOSCOPY (EGD) WITH PROPOFOL N/A 04/24/2019   Procedure: ESOPHAGOGASTRODUODENOSCOPY (EGD) WITH PROPOFOL;  Surgeon: Jonathon Bellows, MD;  Location: [facility] ENDOSCOPY;  Service: Gastroenterology;  Laterality: N/A;   ESOPHAGOGASTRODUODENOSCOPY (EGD) WITH PROPOFOL N/A 01/12/2020   Procedure: ESOPHAGOGASTRODUODENOSCOPY (EGD) WITH PROPOFOL;  Surgeon: Jonathon Bellows, MD;  Location: [facility] ENDOSCOPY;  Service: Gastroenterology;  Laterality: N/A;   ESOPHAGOGASTRODUODENOSCOPY (EGD) WITH PROPOFOL N/A 04/28/2020   Procedure: ESOPHAGOGASTRODUODENOSCOPY (EGD) WITH PROPOFOL;  Surgeon: Jonathon Bellows, MD;  Location: [facility] ENDOSCOPY;  Service: Gastroenterology;  Laterality: N/A;   EUS N/A 05/07/2019   Procedure: FULL UPPER ENDOSCOPIC ULTRASOUND (EUS) RADIAL;  Surgeon: Jola Schmidt, MD;  Location: [facility] ENDOSCOPY;  Service: Endoscopy;  Laterality: N/A;   history of colonoscopy]     ILEOSCOPY N/A 07/22/2018   Procedure: ILEOSCOPY THROUGH STOMA;  Surgeon:  Virgel Manifold, MD;  Location: [facility] ENDOSCOPY;  Service: Endoscopy;  Laterality: N/A;   ILEOSTOMY     ILEOSTOMY N/A 09/08/2018   Procedure: ILEOSTOMY REVISION POSSIBLE CREATION;  Surgeon: Herbert Pun, MD;  Location: [facility] ORS;  Service: General;  Laterality: N/A;   ILEOSTOMY CLOSURE N/A 08/15/2018   Procedure: DILATION OF ILEOSTOMY STRICTURE;  Surgeon: Herbert Pun, MD;  Location: [facility] ORS;  Service: General;  Laterality: N/A;   LAPAROTOMY Right 05/04/2018   Procedure: EXPLORATORY LAPAROTOMY right colectomy right and left ostomy;  Surgeon: Herbert Pun, MD;  Location: [facility] ORS;  Service: General;  Laterality: Right;   LUNG BIOPSY     MASTECTOMY Left    2000, [place] Right    [place]    Prior to Admission medications   Medication Sig Start Date End Date Taking? Authorizing Provider  acetaminophen (TYLENOL) 325 MG tablet Take 2 tablets (650 mg total) by mouth every 6 (six) hours as needed for mild pain (or Fever >/= 101). 12/24/18   Gouru, Illene Silver, MD  albuterol (PROVENTIL HFA;VENTOLIN HFA) 108 (90 Base) MCG/ACT inhaler Inhale 2 puffs into the lungs every 6 (six) hours as needed for wheezing or shortness of breath. 04/24/18   Demetrios Loll, MD  amLODipine [facility]) 5 MG tablet Take by mouth. 02/11/20 02/10/21  [provider]  calcium carbonate (TUMS - DOSED IN MG ELEMENTAL CALCIUM) 500 MG chewable tablet Chew 0.5 tablets (100 mg of elemental calcium total) by mouth daily. 11/05/19   Enzo Bi, MD  calcium-vitamin D (OSCAL WITH D) 250-125 MG-UNIT tablet Take 1 tablet by mouth daily.    [provider]  carvedilol (COREG) 6.25 MG tablet Take 1 tablet (6.25 mg total) by mouth 2 (two) times daily with a meal. 11/11/19 03/07/20  Sreenath, Sudheer B, MD  citalopram (CELEXA) 40 MG tablet Take 40 mg by mouth daily.     [provider]  ferrous sulfate 325 (65 FE) MG tablet Take 1 tablet (325 mg total) by mouth 2 (two) times daily  with a meal. 07/02/18   [name], MD  Fluticasone-Umeclidin-Vilant 100-62.5-25 MCG/INH AEPB Inhale 1 puff into the lungs daily.  01/07/19   [provider]  guaiFENesin-dextromethorphan (ROBITUSSIN DM) 100-10 MG/5ML syrup Take 5 mLs by mouth every 4 (four) hours as needed for cough.    [provider]  loperamide (IMODIUM A-D) 2 MG tablet Take 1 tablet (2 mg total) by mouth as needed for diarrhea or loose stools. 10/04/18   Herbert Pun, MD  Multiple Vitamin (MULTIVITAMIN WITH MINERALS) TABS tablet Take 1 tablet by mouth daily. 06/06/18   Fritzi Mandes, MD  Nutritional Supplements (FEEDING SUPPLEMENT, NEPRO CARB STEADY,) LIQD Take 237 mLs by mouth 3 (three) times daily between meals. 10/21/19   Loletha Grayer, MD  pantoprazole (PROTONIX) 40 MG tablet Take 1 tablet (40 mg total) by mouth 2 (two) times daily before a meal. 07/23/18   Sudini, Alveta Heimlich, MD  sodium bicarbonate 650 MG tablet Take 2 tablets (1,300 mg total) by mouth 3 (three) times daily. 10/21/19   Loletha Grayer, MD  sucralfate (CARAFATE) 1 GM/10ML suspension Take 10 mLs (1 g total) by mouth 4 (four) times daily. 08/18/19   Noreene Filbert, MD  torsemide (DEMADEX) 20 MG tablet Take 1 tablet by mouth daily as needed (swelling).  12/02/19 12/01/20  [provider]    Allergies Patient has no known allergies.  Family History  Problem Relation Age of Onset   Breast cancer Mother 51   Cancer Mother        Breast    Cirrhosis Father    Breast cancer Paternal Aunt 59   Cancer Maternal Aunt        Breast     Social History Social History   Tobacco Use   Smoking status: Former Smoker    Packs/day: 0.50    Years: 20.00    Pack years: 10.00    Types: Cigarettes    Quit date: 07/02/2012    Years since quitting: 7.9   Smokeless tobacco: Current User    Types: Snuff   Tobacco comment: quit 2014  Vaping Use   Vaping Use: Never used  Substance Use Topics   Alcohol use: Not  Currently    Comment: Occasionally   Drug use: No    Review of Systems  Constitutional: No fever/chills Eyes: No visual changes. ENT: No sore throat. Cardiovascular: Denies chest pain. Respiratory: Denies shortness of breath. Gastrointestinal: No abdominal pain.  No nausea, no vomiting.  No diarrhea.  No constipation. Genitourinary: Negative for dysuria. Musculoskeletal: Negative for neck pain, back pain, or extremity pain.  Positive for right middle finger deformity.. Skin: Negative for rash. Neurological: Negative for headaches, focal weakness or numbness.  ____________________________________________   PHYSICAL EXAM:  VITAL SIGNS: ED Triage Vitals [06/05/20 1848]  Enc  Vitals Group     BP 115/75     Pulse Rate 79     Resp 17     Temp 98.3 F (36.8 C)     Temp Source Oral     SpO2 92 %     Weight 122 lb 5.7 oz (55.5 kg)     Height      Head Circumference      Peak Flow      Pain Score 9     Pain Loc      Pain Edu?      Excl. in [place]?     Constitutional: Alert and oriented. Well appearing and in no acute distress. Eyes: Conjunctivae are normal. PERRL. EOMI. Head: Superficial laceration over the left with no active bleeding. Nose: No congestion/rhinnorhea. Mouth/Throat: Mucous membranes are moist.  Oropharynx non-erythematous. Neck: No stridor. No midline tenderness of the cervical spine. Hematological/Lymphatic/Immunilogical: No cervical lymphadenopathy. Cardiovascular: Normal rate, regular rhythm. Grossly normal heart sounds.  Good peripheral circulation. Respiratory: Normal respiratory effort.  No retractions. Lungs CTAB. Gastrointestinal: Soft and nontender. No distention. No abdominal bruits. No CVA tenderness. Genitourinary:  Musculoskeletal: FROM except right middle finger which is deformed at the PIP. Neurologic:  Normal speech and language. No gross focal neurologic deficits are appreciated. No gait instability. Skin:  Skin is warm, dry and intact. No  rash noted. Psychiatric: Mood and affect are normal. Speech and behavior are normal.  ____________________________________________   LABS (all labs ordered are listed, but only abnormal results are displayed)  Labs Reviewed - No data to display ____________________________________________  EKG  Not indicated. ____________________________________________  RADIOLOGY  ED MD interpretation:    Images of the ring left ribs and chest are negative for acute findings.  Post reduction image of the right middle finger is negative for fracture.  CT of the head and cervical spine are negative for acute findings. I, Sherrie George, personally viewed and evaluated these images (plain radiographs) as part of my medical decision making, as well as reviewing the written report by the radiologist.  Official radiology report(s): DG Ribs Unilateral W/Chest Left  Result Date: 06/05/2020 CLINICAL DATA:  LEFT chest and rib pain following fall today. Initial encounter. EXAM: LEFT RIBS AND CHEST - 3+ VIEW COMPARISON:  None. FINDINGS: No fracture or other bone lesions are seen involving the ribs. There is no evidence of pneumothorax or pleural effusion. RIGHT UPPER lung opacity/scarring again noted. Heart size and mediastinal contours are within normal limits. IMPRESSION: 1. No acute abnormality. No rib fracture. 2. RIGHT UPPER lung opacity/scarring. Electronically Signed   By: Margarette Canada M.D.   On: 06/05/2020 19:54   CT Head Wo Contrast  Result Date: 06/05/2020 CLINICAL DATA:  Head trauma.  Mechanical fall. EXAM: CT HEAD WITHOUT CONTRAST CT CERVICAL SPINE WITHOUT CONTRAST TECHNIQUE: Multidetector CT imaging of the head and cervical spine was performed following the standard protocol without intravenous contrast. Multiplanar CT image reconstructions of the cervical spine were also generated. COMPARISON:  January 20, 2020 FINDINGS: CT HEAD FINDINGS Brain: No evidence of acute infarction, hemorrhage, hydrocephalus,  extra-axial collection or mass lesion/mass effect. Vascular: No hyperdense vessel or unexpected calcification. Skull: Normal. Negative for fracture or focal lesion. Sinuses/Orbits: Visualized paranasal sinuses and orbits are unremarkable. Other: None. CT CERVICAL SPINE FINDINGS Alignment: Mild reversal of normal cervical lordosis likely related to patient position Skull base and vertebrae: No acute fracture. No primary bone lesion or focal pathologic process. Soft tissues and spinal canal: No prevertebral fluid or  swelling. No visible canal hematoma. Disc levels: Spinal degenerative changes in the cervical spine greatest at C5-6 and C6-7. Disc space narrowing, small osteophytes and uncovertebral degenerative changes at these levels. Upper chest: Paraseptal and centrilobular emphysema with apical scarring worse on the LEFT. Other: Aortic atherosclerosis and atherosclerotic changes extending into branch vessels at the thoracic inlet. IMPRESSION: 1. No acute intracranial abnormality. 2. No evidence for acute fracture or subluxation of the cervical spine. 3. Spinal degenerative changes in the cervical spine greatest at C5-6 and C6-7. 4. Emphysema and aortic atherosclerosis. Aortic Atherosclerosis (ICD10-I70.0) and Emphysema (ICD10-J43.9). Electronically Signed   By: Zetta Bills M.D.   On: 06/05/2020 19:46   CT Cervical Spine Wo Contrast  Result Date: 06/05/2020 CLINICAL DATA:  Head trauma.  Mechanical fall. EXAM: CT HEAD WITHOUT CONTRAST CT CERVICAL SPINE WITHOUT CONTRAST TECHNIQUE: Multidetector CT imaging of the head and cervical spine was performed following the standard protocol without intravenous contrast. Multiplanar CT image reconstructions of the cervical spine were also generated. COMPARISON:  January 20, 2020 FINDINGS: CT HEAD FINDINGS Brain: No evidence of acute infarction, hemorrhage, hydrocephalus, extra-axial collection or mass lesion/mass effect. Vascular: No hyperdense vessel or unexpected  calcification. Skull: Normal. Negative for fracture or focal lesion. Sinuses/Orbits: Visualized paranasal sinuses and orbits are unremarkable. Other: None. CT CERVICAL SPINE FINDINGS Alignment: Mild reversal of normal cervical lordosis likely related to patient position Skull base and vertebrae: No acute fracture. No primary bone lesion or focal pathologic process. Soft tissues and spinal canal: No prevertebral fluid or swelling. No visible canal hematoma. Disc levels: Spinal degenerative changes in the cervical spine greatest at C5-6 and C6-7. Disc space narrowing, small osteophytes and uncovertebral degenerative changes at these levels. Upper chest: Paraseptal and centrilobular emphysema with apical scarring worse on the LEFT. Other: Aortic atherosclerosis and atherosclerotic changes extending into branch vessels at the thoracic inlet. IMPRESSION: 1. No acute intracranial abnormality. 2. No evidence for acute fracture or subluxation of the cervical spine. 3. Spinal degenerative changes in the cervical spine greatest at C5-6 and C6-7. 4. Emphysema and aortic atherosclerosis. Aortic Atherosclerosis (ICD10-I70.0) and Emphysema (ICD10-J43.9). Electronically Signed   By: Zetta Bills M.D.   On: 06/05/2020 19:46   DG Finger Middle Right  Result Date: 06/05/2020 CLINICAL DATA:  Pain distal aspect third digit, tripped and fell EXAM: RIGHT MIDDLE FINGER 2+V COMPARISON:  None. FINDINGS: Frontal, oblique, and lateral views of the right third digit are obtained. No fracture, subluxation, or dislocation. Joint spaces are well preserved. Soft tissues are normal. IMPRESSION: 1. Unremarkable right third digit. Electronically Signed   By: Randa Ngo M.D.   On: 06/05/2020 19:51    ____________________________________________   PROCEDURES  Procedure(s) performed (including Critical Care):  .Ortho Injury Treatment  Date/Time: 06/05/2020 8:22 PM Performed by: [name], FNP Authorized by: [name], FNP   Consent:    Consent obtained:  Verbal   Consent given by:  Patient   Risks discussed:  Stiffness and fractureInjury location: finger Location details: right long finger Injury type: dislocation Dislocation type: PIP Pre-procedure neurovascular assessment: neurovascularly intact Pre-procedure distal perfusion: normal Pre-procedure neurological function: normal Pre-procedure range of motion: reduced Anesthesia: digital block  Anesthesia: Local anesthesia used: yes Local Anesthetic: lidocaine 1% without epinephrine Anesthetic total: 3 mL Manipulation performed: yes Reduction successful: yes X-ray confirmed reduction: yes Immobilization: splint Splint type: static finger Post-procedure neurovascular assessment: post-procedure neurovascularly intact Post-procedure distal perfusion: normal Post-procedure neurological function: normal Post-procedure range of motion: normal Patient tolerance: patient  tolerated the procedure well with no immediate complications     ____________________________________________   INITIAL IMPRESSION / ASSESSMENT AND PLAN     64 year old female presenting to the emergency department after mechanical, nonsyncopal fall.  See HPI for further details.  Plan will be to reduce her right middle finger and then get post reduction image.  Also will obtain images of the left chest wall, chest, head, and cervical spine.  DIFFERENTIAL DIAGNOSIS  Intracranial injury, cervical spine injury, fracture/fracture-dislocation of the right long finger  ED COURSE  Finger reduced as above and confirmed successful reduction with x-ray.  CT of the head, cervical spine as well as x-rays of the left ribs and chest are all reassuring.  Wound care provided by RN.  Static finger splint applied.  Patient to be discharged home and is to follow-up with her primary care provider.    ___________________________________________   FINAL CLINICAL IMPRESSION(S) / ED  DIAGNOSES  Final diagnoses:  Closed dislocation of interphalangeal joint of right hand, initial encounter  Minor head injury, initial encounter  Superficial laceration of skin     ED Discharge Orders    None       NATHALIA WISMER was evaluated in Emergency Department on 06/05/2020 for the symptoms described in the history of present illness. She was evaluated in the context of the global COVID-19 pandemic, which necessitated consideration that the patient might be at risk for infection with the SARS-CoV-2 virus that causes COVID-19. Institutional protocols and algorithms that pertain to the evaluation of patients at risk for COVID-19 are in a state of rapid change based on information released by regulatory bodies including the CDC and federal and state organizations. These policies and algorithms were followed during the patient's care in the ED.   Note:  This document was prepared using Dragon voice recognition software and may include unintentional dictation errors.   [name], FNP 06/05/20 2025    Blake Divine, MD 06/05/20 2118

## 2020-06-13 ENCOUNTER — Other Ambulatory Visit: Payer: Self-pay | Admitting: Family Medicine

## 2020-06-13 DIAGNOSIS — Z1231 Encounter for screening mammogram for malignant neoplasm of breast: Secondary | ICD-10-CM

## 2020-06-24 ENCOUNTER — Other Ambulatory Visit: Payer: Self-pay | Admitting: Family Medicine

## 2020-06-24 DIAGNOSIS — Z1231 Encounter for screening mammogram for malignant neoplasm of breast: Secondary | ICD-10-CM

## 2020-06-28 ENCOUNTER — Encounter: Payer: Self-pay | Admitting: Internal Medicine

## 2020-06-28 ENCOUNTER — Other Ambulatory Visit: Payer: Self-pay

## 2020-06-28 NOTE — Progress Notes (Signed)
Patient fell on 8/22 after missing. Evaluated in ER for dislocated her right middle finger, bruised her lefts ribs and hit her face (She sustained a laceration over her left face).  Patient c/o insomnia. Pt c/o shortness of breath, but has h/o COPD. Shortness of breath is stable. She denies any other medical problems.

## 2020-06-29 ENCOUNTER — Inpatient Hospital Stay: Payer: Medicare (Managed Care)

## 2020-06-29 ENCOUNTER — Inpatient Hospital Stay (HOSPITAL_BASED_OUTPATIENT_CLINIC_OR_DEPARTMENT_OTHER): Payer: Medicare (Managed Care) | Admitting: Internal Medicine

## 2020-06-29 ENCOUNTER — Encounter: Payer: Self-pay | Admitting: Internal Medicine

## 2020-06-29 ENCOUNTER — Inpatient Hospital Stay: Payer: Medicare (Managed Care) | Attending: Internal Medicine

## 2020-06-29 DIAGNOSIS — D649 Anemia, unspecified: Secondary | ICD-10-CM | POA: Diagnosis not present

## 2020-06-29 DIAGNOSIS — I509 Heart failure, unspecified: Secondary | ICD-10-CM | POA: Insufficient documentation

## 2020-06-29 DIAGNOSIS — C155 Malignant neoplasm of lower third of esophagus: Secondary | ICD-10-CM | POA: Diagnosis not present

## 2020-06-29 DIAGNOSIS — Z9221 Personal history of antineoplastic chemotherapy: Secondary | ICD-10-CM | POA: Diagnosis not present

## 2020-06-29 DIAGNOSIS — R630 Anorexia: Secondary | ICD-10-CM | POA: Insufficient documentation

## 2020-06-29 DIAGNOSIS — Z803 Family history of malignant neoplasm of breast: Secondary | ICD-10-CM | POA: Insufficient documentation

## 2020-06-29 DIAGNOSIS — Z992 Dependence on renal dialysis: Secondary | ICD-10-CM | POA: Insufficient documentation

## 2020-06-29 DIAGNOSIS — R011 Cardiac murmur, unspecified: Secondary | ICD-10-CM | POA: Diagnosis not present

## 2020-06-29 DIAGNOSIS — M129 Arthropathy, unspecified: Secondary | ICD-10-CM | POA: Diagnosis not present

## 2020-06-29 DIAGNOSIS — I10 Essential (primary) hypertension: Secondary | ICD-10-CM | POA: Insufficient documentation

## 2020-06-29 DIAGNOSIS — Z853 Personal history of malignant neoplasm of breast: Secondary | ICD-10-CM | POA: Diagnosis not present

## 2020-06-29 DIAGNOSIS — N184 Chronic kidney disease, stage 4 (severe): Secondary | ICD-10-CM | POA: Diagnosis not present

## 2020-06-29 DIAGNOSIS — Z923 Personal history of irradiation: Secondary | ICD-10-CM | POA: Diagnosis not present

## 2020-06-29 DIAGNOSIS — Z87891 Personal history of nicotine dependence: Secondary | ICD-10-CM | POA: Insufficient documentation

## 2020-06-29 DIAGNOSIS — Z85118 Personal history of other malignant neoplasm of bronchus and lung: Secondary | ICD-10-CM | POA: Diagnosis not present

## 2020-06-29 DIAGNOSIS — R634 Abnormal weight loss: Secondary | ICD-10-CM | POA: Diagnosis not present

## 2020-06-29 DIAGNOSIS — Z8501 Personal history of malignant neoplasm of esophagus: Secondary | ICD-10-CM | POA: Insufficient documentation

## 2020-06-29 DIAGNOSIS — J449 Chronic obstructive pulmonary disease, unspecified: Secondary | ICD-10-CM | POA: Diagnosis not present

## 2020-06-29 DIAGNOSIS — Z79899 Other long term (current) drug therapy: Secondary | ICD-10-CM | POA: Diagnosis not present

## 2020-06-29 DIAGNOSIS — F329 Major depressive disorder, single episode, unspecified: Secondary | ICD-10-CM | POA: Insufficient documentation

## 2020-06-29 DIAGNOSIS — I129 Hypertensive chronic kidney disease with stage 1 through stage 4 chronic kidney disease, or unspecified chronic kidney disease: Secondary | ICD-10-CM | POA: Insufficient documentation

## 2020-06-29 LAB — CBC WITH DIFFERENTIAL/PLATELET
Abs Immature Granulocytes: 0.02 10*3/uL (ref 0.00–0.07)
Basophils Absolute: 0.1 10*3/uL (ref 0.0–0.1)
Basophils Relative: 2 %
Eosinophils Absolute: 0.6 10*3/uL — ABNORMAL HIGH (ref 0.0–0.5)
Eosinophils Relative: 9 %
HCT: 30.8 % — ABNORMAL LOW (ref 36.0–46.0)
Hemoglobin: 10.4 g/dL — ABNORMAL LOW (ref 12.0–15.0)
Immature Granulocytes: 0 %
Lymphocytes Relative: 22 %
Lymphs Abs: 1.4 10*3/uL (ref 0.7–4.0)
MCH: 32.3 pg (ref 26.0–34.0)
MCHC: 33.8 g/dL (ref 30.0–36.0)
MCV: 95.7 fL (ref 80.0–100.0)
Monocytes Absolute: 0.5 10*3/uL (ref 0.1–1.0)
Monocytes Relative: 8 %
Neutro Abs: 3.5 10*3/uL (ref 1.7–7.7)
Neutrophils Relative %: 59 %
Platelets: 216 10*3/uL (ref 150–400)
RBC: 3.22 MIL/uL — ABNORMAL LOW (ref 3.87–5.11)
RDW: 14.1 % (ref 11.5–15.5)
WBC: 6 10*3/uL (ref 4.0–10.5)
nRBC: 0 % (ref 0.0–0.2)

## 2020-06-29 LAB — BASIC METABOLIC PANEL
Anion gap: 11 (ref 5–15)
BUN: 18 mg/dL (ref 8–23)
CO2: 19 mmol/L — ABNORMAL LOW (ref 22–32)
Calcium: 9.3 mg/dL (ref 8.9–10.3)
Chloride: 109 mmol/L (ref 98–111)
Creatinine, Ser: 1.64 mg/dL — ABNORMAL HIGH (ref 0.44–1.00)
GFR calc Af Amer: 37 mL/min — ABNORMAL LOW (ref >60)
GFR calc non Af Amer: 32 mL/min — ABNORMAL LOW (ref >60)
Glucose, Bld: 100 mg/dL — ABNORMAL HIGH (ref 70–99)
Potassium: 4.1 mmol/L (ref 3.5–5.1)
Sodium: 139 mmol/L (ref 135–145)

## 2020-06-29 NOTE — Assessment & Plan Note (Signed)
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  Currently s/p definitive radiation [dec 7th 2020].  STABLE;   Posttreatment PET scan February 2021-no evidence of any metastatic disease.  No uptake in the esophagus region. EGD July 15 th- NO evidence of recurrence. STABLE.  Continue surveillance.  #Severe anemia-likely secondary to CKD stage IV-currently on Retacrit; hemoglobin today is 10.4 HOLD; Retacrit today. STABLE.   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  STABLE;  Await MM labs from today.  #Weight loss/poor appetite- STABLE.   # DISPOSITION:  # HOLD RETACRIT today # in 4 weeks- H&H- possible retacrit # follow up in 8 weeks- cbc/bmp MM panel; K/L light chains-; possible Retacrit;-Dr.B

## 2020-06-29 NOTE — Progress Notes (Signed)
[place] OFFICE PROGRESS NOTE  Patient Care Team: [facility], [place] as PCP - General ([place]) [name], FNP as Nurse Practitioner (Family Medicine) [name], NP as Nurse Practitioner (Nurse Practitioner) [name], RN as Registered Nurse [name], RN as Registered Nurse [name], RN as Oncology Nurse Navigator [name], MD as Consulting Physician (Hematology and Oncology)  Cancer Staging Breast cancer in female [facility]) Staging form: Breast, AJCC 7th Edition - Clinical: Stage IIIA (T3, N1, M0) - Signed by [name], NP on 02/21/2016    Oncology History Overview Note  # 2016- JAN RUL 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 A. LUNG MASS, RIGHT; CT-GUIDED BIOPSY: - ADENOCARCINOMA, ACINAR AND PAPILLARY MORPHOLOGIES. S/p RT [finished Dec 2017] s/p RT [DEC 2017]; AUG 2019- PET-right middle lobe radiation changes no evidence of recurrence.  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  T1b- submucosal involvement; N0- stage I [[name]; [facility]]; unable to proceed with Endo mucosal resection-because of extensive disease [Dr.Branch; [facility]]; recommend definitive radiation [finished  RT- 09/21/2019]   #September 2020 worsening macrocytic anemia--bone marrow biopsy shows plasma cells up to 5%; unlikely to explain patient's anemia.  MDS is still a possibility-if anemia worsens would recommend peripheral blood NGS.  Jan 2021 -anemia likely secondary to worsening CKD-Retacrit  # 2000-  Left breast cancer [T3N1- stage III] mastecomy s/p RT; s/p chemo; Tamoxifen  # NOV 111 2017- Mol testing  [RML- adeno ca]  # 2019-July-aug 2019-ischemic colitis status post right hemicolectomy [Dr.[name]]; acute renal failure;  -----------------------------------------------------------------    DIAGNOSIS: RML Lung ca adeno [stage I] #squamous cell carcinoma esophagus -stage I   CURRENT/MOST RECENT THERAPY: Status post radiation       Breast cancer in female [facility])  07/24/1999 Initial Diagnosis   Breast cancer in female ([place]) T3 N1 M0 tumor ER/PR positive   08/24/1999 -  Anti-estrogen oral therapy   Started tamoxifen   08/24/1999 Surgery   Status post modified radical mastectomy of left breast    Chemotherapy      Radiation Therapy     11/24/2003 - 02/20/2009 Anti-estrogen oral therapy   Started Aromasin   Carcinoma of overlapping sites of left breast in female, estrogen receptor positive ([place])  06/22/2016 Initial Diagnosis   Cancer of overlapping sites of left female breast ([place])   Malignant neoplasm of upper lobe of right lung ([place])  Malignant neoplasm of lower third of esophagus ([place])  05/01/2019 Initial Diagnosis   Malignant neoplasm of lower third of esophagus ([place])     INTERVAL HISTORY:  Danielle [name] 64 y.o.  female pleasant patient above history of multiple malignancies including most recent stage I squamous cell cancer of esophagus currently status post radiation; and history of severe anemia is here for follow-up.  The interim patient had a mechanical fall hit her left side of the head; and also her right hand.  CT cervical spine/head; x-rays of ribs and hand no fractures.  Reviewed the imaging.  Patient denies any difficulty swallowing.  Denies any regurgitation.  No nausea no vomiting.  Continues to have chronic shortness of breath chronic cough not any worse.   Review of Systems  Constitutional: Positive for malaise/fatigue and weight loss. Negative for chills, diaphoresis and fever.  HENT: Negative for nosebleeds and sore throat.   Eyes: Negative for double  vision.  Respiratory: Positive for cough and shortness of breath. Negative for hemoptysis, sputum production and wheezing.   Cardiovascular: Negative for chest pain, palpitations, orthopnea and leg swelling.  Gastrointestinal: Negative for abdominal pain, blood in stool, constipation, heartburn, melena and vomiting.  Genitourinary: Negative for dysuria, frequency and urgency.  Musculoskeletal: Negative for back pain and joint pain.  Skin: Negative.  Negative for itching and rash.  Neurological: Negative for tingling, focal weakness, weakness and headaches.  Endo/Heme/Allergies: Does not bruise/bleed easily.  Psychiatric/Behavioral: Negative for depression. The patient is not nervous/anxious and does not have insomnia.       PAST MEDICAL HISTORY :  Past Medical History:  Diagnosis Date   Anxiety    Arthritis    Breast cancer ([place]) 2000   Cancer ([place]) left    breast cancer 2000, chemo tx's with total mastectomy and lymph nodes resected.    Cancer of right lung ([place]) 02/21/2016   rad tx's.    CHF (congestive heart failure) (HCC)    COPD (chronic obstructive pulmonary disease) (HCC)    Dependence on supplemental oxygen    Depression    Heart murmur    Hypertension    Lung nodule    Lymphedema    Personal history of chemotherapy    Personal history of radiation therapy    Shortness of breath dyspnea    with exertion   Status post chemotherapy 2001   left breast cancer   Status post radiation therapy 2001   left breast cancer    PAST SURGICAL HISTORY :   Past Surgical History:  Procedure Laterality Date   Breast Biospy Left    [facility]   BREAST SURGERY     COLONOSCOPY N/A 04/30/2018   Procedure: COLONOSCOPY;  Surgeon: Virgel Manifold, MD;  Location: [facility] ENDOSCOPY;  Service: Endoscopy;  Laterality: N/A;   COLONOSCOPY N/A 07/22/2018   Procedure: COLONOSCOPY;  Surgeon: Virgel Manifold, MD;  Location: [facility] ENDOSCOPY;  Service: Endoscopy;  Laterality:  N/A;   DILATION AND CURETTAGE OF UTERUS     ELECTROMAGNETIC NAVIGATION BROCHOSCOPY Right 04/11/2016   Procedure: ELECTROMAGNETIC NAVIGATION BRONCHOSCOPY;  Surgeon: Vilinda Boehringer, MD;  Location: [facility] ORS;  Service: Cardiopulmonary;  Laterality: Right;   ESOPHAGOGASTRODUODENOSCOPY N/A 07/22/2018   Procedure: ESOPHAGOGASTRODUODENOSCOPY (EGD);  Surgeon: Virgel Manifold, MD;  Location: [facility] ENDOSCOPY;  Service: Endoscopy;  Laterality: N/A;   ESOPHAGOGASTRODUODENOSCOPY (EGD) WITH PROPOFOL N/A 05/07/2018   Procedure: ESOPHAGOGASTRODUODENOSCOPY (EGD) WITH PROPOFOL;  Surgeon: Lucilla Lame, MD;  Location: [facility] ENDOSCOPY;  Service: Endoscopy;  Laterality: N/A;   ESOPHAGOGASTRODUODENOSCOPY (EGD) WITH PROPOFOL N/A 04/24/2019   Procedure: ESOPHAGOGASTRODUODENOSCOPY (EGD) WITH PROPOFOL;  Surgeon: Jonathon Bellows, MD;  Location: [facility] ENDOSCOPY;  Service: Gastroenterology;  Laterality: N/A;   ESOPHAGOGASTRODUODENOSCOPY (EGD) WITH PROPOFOL N/A 01/12/2020   Procedure: ESOPHAGOGASTRODUODENOSCOPY (EGD) WITH PROPOFOL;  Surgeon: Jonathon Bellows, MD;  Location: [facility] ENDOSCOPY;  Service: Gastroenterology;  Laterality: N/A;   ESOPHAGOGASTRODUODENOSCOPY (EGD) WITH PROPOFOL N/A 04/28/2020   Procedure: ESOPHAGOGASTRODUODENOSCOPY (EGD) WITH PROPOFOL;  Surgeon: Jonathon Bellows, MD;  Location: [facility] ENDOSCOPY;  Service: Gastroenterology;  Laterality: N/A;   EUS N/A 05/07/2019   Procedure: FULL UPPER ENDOSCOPIC ULTRASOUND (EUS) RADIAL;  Surgeon: Jola Schmidt, MD;  Location: [facility] ENDOSCOPY;  Service: Endoscopy;  Laterality: N/A;   history of colonoscopy]     ILEOSCOPY N/A 07/22/2018   Procedure: ILEOSCOPY THROUGH STOMA;  Surgeon: Virgel Manifold, MD;  Location: [facility] ENDOSCOPY;  Service: Endoscopy;  Laterality: N/A;   ILEOSTOMY     ILEOSTOMY N/A 09/08/2018   Procedure: ILEOSTOMY REVISION POSSIBLE CREATION;  Surgeon: Herbert Pun, MD;  Location: [facility] ORS;  Service: General;  Laterality: N/A;   ILEOSTOMY CLOSURE N/A  08/15/2018   Procedure: DILATION OF ILEOSTOMY STRICTURE;  Surgeon: Herbert Pun, MD;  Location: [facility] ORS;  Service: General;  Laterality: N/A;   LAPAROTOMY Right 05/04/2018   Procedure: EXPLORATORY LAPAROTOMY right colectomy right and left ostomy;  Surgeon: Herbert Pun, MD;  Location: [facility] ORS;  Service: General;  Laterality: Right;   LUNG BIOPSY     MASTECTOMY Left    2000, [facility]   ROTATOR CUFF REPAIR Right    [facility]    FAMILY HISTORY :   Family History  Problem Relation Age of Onset   Breast cancer Mother 24   Cancer Mother        Breast    Cirrhosis Father    Breast cancer Paternal Aunt 33   Cancer Maternal Aunt        Breast     SOCIAL HISTORY:   Social History   Tobacco Use   Smoking status: Former Smoker    Packs/day: 0.50    Years: 20.00    Pack years: 10.00    Types: Cigarettes    Quit date: 07/02/2012    Years since quitting: 7.9   Smokeless tobacco: Current User    Types: Snuff   Tobacco comment: quit 2014  Vaping Use   Vaping Use: Never used  Substance Use Topics   Alcohol use: Not Currently    Comment: Occasionally   Drug use: No    ALLERGIES:  has No Known Allergies.  MEDICATIONS:  Current Outpatient Medications  Medication Sig Dispense Refill   acetaminophen (TYLENOL) 325 MG tablet Take 2 tablets (650 mg total) by mouth every 6 (six) hours as needed for mild pain (or Fever >/= 101).     albuterol (PROVENTIL HFA;VENTOLIN HFA) 108 (90 Base) MCG/ACT inhaler Inhale 2 puffs into the lungs every 6 (six) hours as needed for wheezing or shortness of breath. 1 Inhaler 2   amLODipine (NORVASC) 5 MG tablet Take by mouth.     calcium carbonate (TUMS - DOSED IN MG ELEMENTAL CALCIUM) 500 MG chewable tablet Chew 0.5 tablets (100 mg of elemental calcium total) by mouth daily.     calcium-vitamin D (OSCAL WITH D) 250-125 MG-UNIT tablet Take 1 tablet by mouth daily.     carvedilol (COREG) 6.25 MG tablet Take 1 tablet (6.25 mg total)  by mouth 2 (two) times daily with a meal. 60 tablet 0   citalopram (CELEXA) 40 MG tablet Take 40 mg by mouth daily.      ferrous sulfate 325 (65 FE) MG tablet Take 1 tablet (325 mg total) by mouth 2 (two) times daily with a meal. 60 tablet 3   Fluticasone-Umeclidin-Vilant 100-62.5-25 MCG/INH AEPB Inhale 1 puff into the lungs daily.      Multiple Vitamin (MULTIVITAMIN WITH MINERALS) TABS tablet Take 1 tablet by mouth daily. 30 tablet 1   Nutritional Supplements (FEEDING SUPPLEMENT, NEPRO CARB STEADY,) LIQD Take 237 mLs by mouth 3 (three) times daily between meals. 21330 mL 0   pantoprazole (PROTONIX) 40 MG tablet Take 1 tablet (40 mg total) by mouth 2 (two) times daily before a meal. 60 tablet 0   sodium bicarbonate 650 MG tablet Take 2 tablets (1,300 mg total) by mouth  3 (three) times daily. 180 tablet 0   sucralfate (CARAFATE) 1 GM/10ML suspension Take 10 mLs (1 g total) by mouth 4 (four) times daily. 1200 mL 2   torsemide (DEMADEX) 20 MG tablet Take 1 tablet by mouth daily as needed (swelling).      guaiFENesin-dextromethorphan (ROBITUSSIN DM) 100-10 MG/5ML syrup Take 5 mLs by mouth every 4 (four) hours as needed for cough. (Patient not taking: Reported on 06/28/2020)     loperamide (IMODIUM A-D) 2 MG tablet Take 1 tablet (2 mg total) by mouth as needed for diarrhea or loose stools. (Patient not taking: Reported on 06/28/2020) 30 tablet 0   No current facility-administered medications for this visit.    PHYSICAL EXAMINATION: ECOG PERFORMANCE STATUS: 1 - Symptomatic but completely ambulatory  BP 131/81 (BP Location: Right Arm, Patient Position: Sitting, Cuff Size: Normal)    Pulse 88    Temp (!) 97 F (36.1 C) (Tympanic)    Resp 16    Ht 5\' 6"  (1.676 m)    Wt 122 lb (55.3 kg)    SpO2 95%    BMI 19.69 kg/m   Filed Weights   06/29/20 0945  Weight: 122 lb (55.3 kg)    Physical Exam Constitutional:      Comments: Thin built moderately nourished female patient.  She is walking  herself.  She is alone.  HENT:     Head: Normocephalic and atraumatic.     Mouth/Throat:     Pharynx: No oropharyngeal exudate.  Eyes:     Pupils: Pupils are equal, round, and reactive to light.  Cardiovascular:     Rate and Rhythm: Normal rate and regular rhythm.  Pulmonary:     Effort: No respiratory distress.     Breath sounds: No wheezing.  Abdominal:     General: Bowel sounds are normal. There is no distension.     Palpations: Abdomen is soft. There is no mass.     Tenderness: There is no abdominal tenderness. There is no guarding or rebound.  Musculoskeletal:        General: No tenderness. Normal range of motion.     Cervical back: Normal range of motion and neck supple.     Comments: Left upper extremity-swollen/no warmth or tenderness.   Skin:    General: Skin is warm.  Neurological:     Mental Status: She is alert and oriented to person, place, and time.  Psychiatric:        Mood and Affect: Affect normal.        LABORATORY DATA:  I have reviewed the data as listed    Component Value Date/Time   NA 139 06/29/2020 0930   NA 132 (L) 02/09/2015 1100   K 4.1 06/29/2020 0930   K 3.8 02/09/2015 1100   CL 109 06/29/2020 0930   CL 95 (L) 02/09/2015 1100   CO2 19 (L) 06/29/2020 0930   CO2 29 02/09/2015 1100   GLUCOSE 100 (H) 06/29/2020 0930   GLUCOSE 105 (H) 02/09/2015 1100   BUN 18 06/29/2020 0930   BUN 16 02/09/2015 1100   CREATININE 1.64 (H) 06/29/2020 0930   CREATININE 0.81 02/09/2015 1100   CALCIUM 9.3 06/29/2020 0930   CALCIUM 9.1 02/09/2015 1100   PROT 7.3 01/20/2020 0502   PROT 7.7 02/09/2015 1100   ALBUMIN 3.6 01/20/2020 0502   ALBUMIN 4.3 02/09/2015 1100   AST 40 01/20/2020 0502   AST 29 02/09/2015 1100   ALT 22 01/20/2020 0502   ALT 20  02/09/2015 1100   ALKPHOS 67 01/20/2020 0502   ALKPHOS 69 02/09/2015 1100   BILITOT 1.2 01/20/2020 0502   BILITOT 0.9 02/09/2015 1100   GFRNONAA 32 (L) 06/29/2020 0930   GFRNONAA >60 02/09/2015 1100   GFRAA  37 (L) 06/29/2020 0930   GFRAA >60 02/09/2015 1100    No results found for: SPEP, UPEP  Lab Results  Component Value Date   WBC 6.0 06/29/2020   NEUTROABS 3.5 06/29/2020   HGB 10.4 (L) 06/29/2020   HCT 30.8 (L) 06/29/2020   MCV 95.7 06/29/2020   PLT 216 06/29/2020      Chemistry      Component Value Date/Time   NA 139 06/29/2020 0930   NA 132 (L) 02/09/2015 1100   K 4.1 06/29/2020 0930   K 3.8 02/09/2015 1100   CL 109 06/29/2020 0930   CL 95 (L) 02/09/2015 1100   CO2 19 (L) 06/29/2020 0930   CO2 29 02/09/2015 1100   BUN 18 06/29/2020 0930   BUN 16 02/09/2015 1100   CREATININE 1.64 (H) 06/29/2020 0930   CREATININE 0.81 02/09/2015 1100      Component Value Date/Time   CALCIUM 9.3 06/29/2020 0930   CALCIUM 9.1 02/09/2015 1100   ALKPHOS 67 01/20/2020 0502   ALKPHOS 69 02/09/2015 1100   AST 40 01/20/2020 0502   AST 29 02/09/2015 1100   ALT 22 01/20/2020 0502   ALT 20 02/09/2015 1100   BILITOT 1.2 01/20/2020 0502   BILITOT 0.9 02/09/2015 1100       RADIOGRAPHIC STUDIES: I have personally reviewed the radiological images as listed and agreed with the findings in the report. No results found.   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  Currently s/p definitive radiation [dec 7th 2020].  STABLE;   Posttreatment PET scan February 2021-no evidence of any metastatic disease.  No uptake in the esophagus region. EGD July 15 th- NO evidence of recurrence. STABLE.  Continue surveillance.  #Severe anemia-likely secondary to CKD stage IV-currently on Retacrit; hemoglobin today is 10.4 HOLD; Retacrit today. STABLE.   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  STABLE;  Await MM labs from today.  #Weight  loss/poor appetite- STABLE.   # DISPOSITION:  # HOLD RETACRIT today # in 4 weeks- H&H- possible retacrit # follow up in 8 weeks- cbc/bmp MM panel; K/L light chains-; possible Retacrit;-Dr.B     No orders of the defined types were placed in this encounter.  All questions were answered. The patient knows to call the clinic with any problems, questions or concerns.      [name], MD 06/29/2020 10:13 AM

## 2020-06-30 LAB — KAPPA/LAMBDA LIGHT CHAINS
Kappa free light chain: 44.2 mg/L — ABNORMAL HIGH (ref 3.3–19.4)
Kappa, lambda light chain ratio: 0.44 (ref 0.26–1.65)
Lambda free light chains: 99.8 mg/L — ABNORMAL HIGH (ref 5.7–26.3)

## 2020-07-01 LAB — MULTIPLE MYELOMA PANEL, SERUM
Albumin SerPl Elph-Mcnc: 4 g/dL (ref 2.9–4.4)
Albumin/Glob SerPl: 1.2 (ref 0.7–1.7)
Alpha 1: 0.2 g/dL (ref 0.0–0.4)
Alpha2 Glob SerPl Elph-Mcnc: 0.8 g/dL (ref 0.4–1.0)
B-Globulin SerPl Elph-Mcnc: 1 g/dL (ref 0.7–1.3)
Gamma Glob SerPl Elph-Mcnc: 1.4 g/dL (ref 0.4–1.8)
Globulin, Total: 3.5 g/dL (ref 2.2–3.9)
IgA: 203 mg/dL (ref 87–352)
IgG (Immunoglobin G), Serum: 1378 mg/dL (ref 586–1602)
IgM (Immunoglobulin M), Srm: 40 mg/dL (ref 26–217)
M Protein SerPl Elph-Mcnc: 0.9 g/dL — ABNORMAL HIGH
Total Protein ELP: 7.5 g/dL (ref 6.0–8.5)

## 2020-07-27 ENCOUNTER — Inpatient Hospital Stay: Payer: Medicare Other | Attending: Internal Medicine

## 2020-07-27 ENCOUNTER — Inpatient Hospital Stay: Payer: Medicare Other

## 2020-07-27 ENCOUNTER — Other Ambulatory Visit: Payer: Self-pay

## 2020-07-27 DIAGNOSIS — D631 Anemia in chronic kidney disease: Secondary | ICD-10-CM | POA: Insufficient documentation

## 2020-07-27 DIAGNOSIS — I129 Hypertensive chronic kidney disease with stage 1 through stage 4 chronic kidney disease, or unspecified chronic kidney disease: Secondary | ICD-10-CM | POA: Diagnosis not present

## 2020-07-27 DIAGNOSIS — N184 Chronic kidney disease, stage 4 (severe): Secondary | ICD-10-CM | POA: Insufficient documentation

## 2020-07-27 DIAGNOSIS — C155 Malignant neoplasm of lower third of esophagus: Secondary | ICD-10-CM

## 2020-07-27 LAB — HEMOGLOBIN: Hemoglobin: 10.9 g/dL — ABNORMAL LOW (ref 12.0–15.0)

## 2020-07-27 LAB — HEMATOCRIT: HCT: 31.7 % — ABNORMAL LOW (ref 36.0–46.0)

## 2020-07-27 NOTE — Progress Notes (Signed)
hgb 10.9. no injection is needed per Dr. Aletha Halim perimeters. Patient aware.

## 2020-07-31 ENCOUNTER — Emergency Department: Payer: Medicare Other

## 2020-07-31 ENCOUNTER — Other Ambulatory Visit: Payer: Self-pay

## 2020-07-31 DIAGNOSIS — I130 Hypertensive heart and chronic kidney disease with heart failure and stage 1 through stage 4 chronic kidney disease, or unspecified chronic kidney disease: Secondary | ICD-10-CM | POA: Diagnosis present

## 2020-07-31 DIAGNOSIS — Z803 Family history of malignant neoplasm of breast: Secondary | ICD-10-CM

## 2020-07-31 DIAGNOSIS — J441 Chronic obstructive pulmonary disease with (acute) exacerbation: Principal | ICD-10-CM | POA: Diagnosis present

## 2020-07-31 DIAGNOSIS — F17220 Nicotine dependence, chewing tobacco, uncomplicated: Secondary | ICD-10-CM | POA: Diagnosis present

## 2020-07-31 DIAGNOSIS — Z853 Personal history of malignant neoplasm of breast: Secondary | ICD-10-CM

## 2020-07-31 DIAGNOSIS — N184 Chronic kidney disease, stage 4 (severe): Secondary | ICD-10-CM | POA: Diagnosis present

## 2020-07-31 DIAGNOSIS — N179 Acute kidney failure, unspecified: Secondary | ICD-10-CM | POA: Diagnosis present

## 2020-07-31 DIAGNOSIS — Z8616 Personal history of COVID-19: Secondary | ICD-10-CM

## 2020-07-31 DIAGNOSIS — I4891 Unspecified atrial fibrillation: Secondary | ICD-10-CM | POA: Diagnosis present

## 2020-07-31 DIAGNOSIS — I5032 Chronic diastolic (congestive) heart failure: Secondary | ICD-10-CM | POA: Diagnosis present

## 2020-07-31 DIAGNOSIS — Z79899 Other long term (current) drug therapy: Secondary | ICD-10-CM

## 2020-07-31 DIAGNOSIS — F32A Depression, unspecified: Secondary | ICD-10-CM | POA: Diagnosis present

## 2020-07-31 DIAGNOSIS — Z9981 Dependence on supplemental oxygen: Secondary | ICD-10-CM

## 2020-07-31 DIAGNOSIS — Z7951 Long term (current) use of inhaled steroids: Secondary | ICD-10-CM

## 2020-07-31 DIAGNOSIS — Z923 Personal history of irradiation: Secondary | ICD-10-CM

## 2020-07-31 DIAGNOSIS — Z9221 Personal history of antineoplastic chemotherapy: Secondary | ICD-10-CM

## 2020-07-31 DIAGNOSIS — Z85118 Personal history of other malignant neoplasm of bronchus and lung: Secondary | ICD-10-CM

## 2020-07-31 DIAGNOSIS — J9621 Acute and chronic respiratory failure with hypoxia: Secondary | ICD-10-CM | POA: Diagnosis present

## 2020-07-31 DIAGNOSIS — D631 Anemia in chronic kidney disease: Secondary | ICD-10-CM | POA: Diagnosis present

## 2020-07-31 DIAGNOSIS — Z9012 Acquired absence of left breast and nipple: Secondary | ICD-10-CM

## 2020-07-31 LAB — CBC
HCT: 28.6 % — ABNORMAL LOW (ref 36.0–46.0)
Hemoglobin: 9.9 g/dL — ABNORMAL LOW (ref 12.0–15.0)
MCH: 33.3 pg (ref 26.0–34.0)
MCHC: 34.6 g/dL (ref 30.0–36.0)
MCV: 96.3 fL (ref 80.0–100.0)
Platelets: 181 10*3/uL (ref 150–400)
RBC: 2.97 MIL/uL — ABNORMAL LOW (ref 3.87–5.11)
RDW: 14.3 % (ref 11.5–15.5)
WBC: 7.5 10*3/uL (ref 4.0–10.5)
nRBC: 0.3 % — ABNORMAL HIGH (ref 0.0–0.2)

## 2020-07-31 LAB — BASIC METABOLIC PANEL
Anion gap: 11 (ref 5–15)
BUN: 17 mg/dL (ref 8–23)
CO2: 25 mmol/L (ref 22–32)
Calcium: 9.2 mg/dL (ref 8.9–10.3)
Chloride: 98 mmol/L (ref 98–111)
Creatinine, Ser: 1.3 mg/dL — ABNORMAL HIGH (ref 0.44–1.00)
GFR, Estimated: 43 mL/min — ABNORMAL LOW (ref >60)
Glucose, Bld: 80 mg/dL (ref 70–99)
Potassium: 4.5 mmol/L (ref 3.5–5.1)
Sodium: 134 mmol/L — ABNORMAL LOW (ref 135–145)

## 2020-07-31 LAB — LACTIC ACID, PLASMA: Lactic Acid, Venous: 0.9 mmol/L (ref 0.5–1.9)

## 2020-07-31 LAB — TROPONIN I (HIGH SENSITIVITY): Troponin I (High Sensitivity): 23 ng/L — ABNORMAL HIGH (ref <18)

## 2020-07-31 NOTE — ED Triage Notes (Signed)
Pt to ED via [facility] for [facility] that started this morning. Hx of COPD.  Wears 2L [place] prn at home, EMS placed pt on 3L [place] for SpO2 90%.  Denies CP.  Pt appears dyspneic at rest. Speaking in short sentences.

## 2020-08-01 ENCOUNTER — Other Ambulatory Visit: Payer: Self-pay

## 2020-08-01 ENCOUNTER — Inpatient Hospital Stay
Admission: EM | Admit: 2020-08-01 | Discharge: 2020-08-05 | DRG: 190 | Disposition: A | Discharge status: Home Health | Payer: Medicare Other | Attending: Internal Medicine | Admitting: Internal Medicine

## 2020-08-01 DIAGNOSIS — Z923 Personal history of irradiation: Secondary | ICD-10-CM | POA: Diagnosis not present

## 2020-08-01 DIAGNOSIS — J441 Chronic obstructive pulmonary disease with (acute) exacerbation: Secondary | ICD-10-CM | POA: Diagnosis not present

## 2020-08-01 DIAGNOSIS — F32A Depression, unspecified: Secondary | ICD-10-CM | POA: Diagnosis not present

## 2020-08-01 DIAGNOSIS — J449 Chronic obstructive pulmonary disease, unspecified: Secondary | ICD-10-CM | POA: Diagnosis present

## 2020-08-01 DIAGNOSIS — R0602 Shortness of breath: Secondary | ICD-10-CM

## 2020-08-01 DIAGNOSIS — J9621 Acute and chronic respiratory failure with hypoxia: Secondary | ICD-10-CM | POA: Diagnosis present

## 2020-08-01 DIAGNOSIS — D631 Anemia in chronic kidney disease: Secondary | ICD-10-CM | POA: Diagnosis not present

## 2020-08-01 DIAGNOSIS — Z85118 Personal history of other malignant neoplasm of bronchus and lung: Secondary | ICD-10-CM | POA: Diagnosis not present

## 2020-08-01 DIAGNOSIS — I5032 Chronic diastolic (congestive) heart failure: Secondary | ICD-10-CM | POA: Diagnosis not present

## 2020-08-01 DIAGNOSIS — I10 Essential (primary) hypertension: Secondary | ICD-10-CM | POA: Diagnosis not present

## 2020-08-01 DIAGNOSIS — Z803 Family history of malignant neoplasm of breast: Secondary | ICD-10-CM | POA: Diagnosis not present

## 2020-08-01 DIAGNOSIS — Z9012 Acquired absence of left breast and nipple: Secondary | ICD-10-CM | POA: Diagnosis not present

## 2020-08-01 DIAGNOSIS — Z79899 Other long term (current) drug therapy: Secondary | ICD-10-CM | POA: Diagnosis not present

## 2020-08-01 DIAGNOSIS — I5033 Acute on chronic diastolic (congestive) heart failure: Secondary | ICD-10-CM | POA: Diagnosis present

## 2020-08-01 DIAGNOSIS — I4891 Unspecified atrial fibrillation: Secondary | ICD-10-CM | POA: Diagnosis present

## 2020-08-01 DIAGNOSIS — Z7951 Long term (current) use of inhaled steroids: Secondary | ICD-10-CM | POA: Diagnosis not present

## 2020-08-01 DIAGNOSIS — I480 Paroxysmal atrial fibrillation: Secondary | ICD-10-CM | POA: Diagnosis present

## 2020-08-01 DIAGNOSIS — N179 Acute kidney failure, unspecified: Secondary | ICD-10-CM | POA: Diagnosis present

## 2020-08-01 DIAGNOSIS — N184 Chronic kidney disease, stage 4 (severe): Secondary | ICD-10-CM | POA: Diagnosis present

## 2020-08-01 DIAGNOSIS — Z853 Personal history of malignant neoplasm of breast: Secondary | ICD-10-CM | POA: Diagnosis not present

## 2020-08-01 DIAGNOSIS — F17220 Nicotine dependence, chewing tobacco, uncomplicated: Secondary | ICD-10-CM | POA: Diagnosis present

## 2020-08-01 DIAGNOSIS — I4892 Unspecified atrial flutter: Secondary | ICD-10-CM | POA: Diagnosis present

## 2020-08-01 DIAGNOSIS — Z9221 Personal history of antineoplastic chemotherapy: Secondary | ICD-10-CM | POA: Diagnosis not present

## 2020-08-01 DIAGNOSIS — Z9981 Dependence on supplemental oxygen: Secondary | ICD-10-CM | POA: Diagnosis not present

## 2020-08-01 DIAGNOSIS — I130 Hypertensive heart and chronic kidney disease with heart failure and stage 1 through stage 4 chronic kidney disease, or unspecified chronic kidney disease: Secondary | ICD-10-CM | POA: Diagnosis present

## 2020-08-01 DIAGNOSIS — Z8616 Personal history of COVID-19: Secondary | ICD-10-CM | POA: Diagnosis not present

## 2020-08-01 DIAGNOSIS — I5043 Acute on chronic combined systolic (congestive) and diastolic (congestive) heart failure: Secondary | ICD-10-CM | POA: Diagnosis present

## 2020-08-01 LAB — TSH: TSH: 0.04 u[IU]/mL — ABNORMAL LOW (ref 0.350–4.500)

## 2020-08-01 LAB — RESPIRATORY PANEL BY RT PCR (FLU A&B, COVID)
Influenza A by PCR: NEGATIVE
Influenza B by PCR: NEGATIVE
SARS Coronavirus 2 by RT PCR: NEGATIVE

## 2020-08-01 LAB — TROPONIN I (HIGH SENSITIVITY): Troponin I (High Sensitivity): 18 ng/L — ABNORMAL HIGH (ref <18)

## 2020-08-01 LAB — BRAIN NATRIURETIC PEPTIDE: B Natriuretic Peptide: 849.5 pg/mL — ABNORMAL HIGH (ref 0.0–100.0)

## 2020-08-01 LAB — FIBRIN DERIVATIVES D-DIMER (ARMC ONLY): Fibrin derivatives D-dimer (ARMC): 1618.01 ng/mL (FEU) — ABNORMAL HIGH (ref 0.00–499.00)

## 2020-08-01 LAB — PROCALCITONIN: Procalcitonin: <0.1 ng/mL

## 2020-08-01 MED ORDER — FLUTICASONE FUROATE-VILANTEROL 100-25 MCG/INH IN AEPB
1.0000 | INHALATION_SPRAY | Freq: Every day | RESPIRATORY_TRACT | Status: DC
  Administered 2020-08-02 – 2020-08-05 (×4): 1 via RESPIRATORY_TRACT
  Filled 2020-08-01: qty 28

## 2020-08-01 MED ORDER — NEPRO/CARBSTEADY PO LIQD
237.0000 mL | Freq: Three times a day (TID) | ORAL | Status: DC
  Administered 2020-08-01 – 2020-08-05 (×12): 237 mL via ORAL

## 2020-08-01 MED ORDER — PREDNISONE 20 MG PO TABS
40.0000 mg | ORAL_TABLET | Freq: Every day | ORAL | Status: AC
  Administered 2020-08-02 – 2020-08-05 (×4): 40 mg via ORAL
  Filled 2020-08-01 (×4): qty 2

## 2020-08-01 MED ORDER — AMLODIPINE BESYLATE 5 MG PO TABS
5.0000 mg | ORAL_TABLET | Freq: Every day | ORAL | Status: DC
  Administered 2020-08-01: 5 mg via ORAL
  Filled 2020-08-01: qty 1

## 2020-08-01 MED ORDER — SODIUM BICARBONATE 650 MG PO TABS
1300.0000 mg | ORAL_TABLET | Freq: Three times a day (TID) | ORAL | Status: DC
  Administered 2020-08-01 – 2020-08-05 (×13): 1300 mg via ORAL
  Filled 2020-08-01 (×16): qty 2

## 2020-08-01 MED ORDER — IPRATROPIUM-ALBUTEROL 0.5-2.5 (3) MG/3ML IN SOLN: 3.0000 mL | Freq: Once | RESPIRATORY_TRACT | Status: DC

## 2020-08-01 MED ORDER — IPRATROPIUM-ALBUTEROL 0.5-2.5 (3) MG/3ML IN SOLN
3.0000 mL | Freq: Four times a day (QID) | RESPIRATORY_TRACT | Status: DC
  Administered 2020-08-01: 3 mL via RESPIRATORY_TRACT

## 2020-08-01 MED ORDER — CITALOPRAM HYDROBROMIDE 20 MG PO TABS
40.0000 mg | ORAL_TABLET | Freq: Every day | ORAL | Status: DC
  Administered 2020-08-01 – 2020-08-05 (×5): 40 mg via ORAL
  Filled 2020-08-01 (×5): qty 2

## 2020-08-01 MED ORDER — METHYLPREDNISOLONE SODIUM SUCC 125 MG IJ SOLR
125.0000 mg | Freq: Once | INTRAMUSCULAR | Status: AC
  Administered 2020-08-01: 125 mg via INTRAVENOUS
  Filled 2020-08-01: qty 2

## 2020-08-01 MED ORDER — CARVEDILOL 6.25 MG PO TABS
6.2500 mg | ORAL_TABLET | Freq: Two times a day (BID) | ORAL | Status: DC
  Administered 2020-08-01: 6.25 mg via ORAL
  Filled 2020-08-01 (×2): qty 1

## 2020-08-01 MED ORDER — TORSEMIDE 20 MG PO TABS
20.0000 mg | ORAL_TABLET | Freq: Every day | ORAL | Status: DC | PRN
  Filled 2020-08-01: qty 1

## 2020-08-01 MED ORDER — FERROUS SULFATE 325 (65 FE) MG PO TABS
325.0000 mg | ORAL_TABLET | Freq: Two times a day (BID) | ORAL | Status: DC
  Administered 2020-08-01 – 2020-08-05 (×8): 325 mg via ORAL
  Filled 2020-08-01 (×9): qty 1

## 2020-08-01 MED ORDER — SODIUM CHLORIDE 0.9 % IV SOLN: INTRAVENOUS | Status: AC

## 2020-08-01 MED ORDER — CALCIUM CARBONATE ANTACID 500 MG PO CHEW
0.5000 | CHEWABLE_TABLET | Freq: Every day | ORAL | Status: DC
  Administered 2020-08-01 – 2020-08-05 (×5): 100 mg via ORAL
  Filled 2020-08-01 (×5): qty 1

## 2020-08-01 MED ORDER — ACETAMINOPHEN 325 MG PO TABS
650.0000 mg | ORAL_TABLET | Freq: Four times a day (QID) | ORAL | Status: DC | PRN
  Administered 2020-08-01 – 2020-08-04 (×3): 650 mg via ORAL
  Filled 2020-08-01 (×3): qty 2

## 2020-08-01 MED ORDER — DILTIAZEM HCL-DEXTROSE 125-5 MG/125ML-% IV SOLN (PREMIX)
5.0000 mg/h | INTRAVENOUS | Status: DC
  Administered 2020-08-01 – 2020-08-02 (×2): 5 mg/h via INTRAVENOUS
  Filled 2020-08-01 (×2): qty 125

## 2020-08-01 MED ORDER — LOPERAMIDE HCL 2 MG PO CAPS
2.0000 mg | ORAL_CAPSULE | ORAL | Status: DC | PRN
  Administered 2020-08-01 – 2020-08-05 (×4): 2 mg via ORAL
  Filled 2020-08-01 (×4): qty 1

## 2020-08-01 MED ORDER — IPRATROPIUM-ALBUTEROL 0.5-2.5 (3) MG/3ML IN SOLN: 3.0000 mL | Freq: Four times a day (QID) | RESPIRATORY_TRACT | Status: DC

## 2020-08-01 MED ORDER — PANTOPRAZOLE SODIUM 40 MG PO TBEC
40.0000 mg | DELAYED_RELEASE_TABLET | Freq: Two times a day (BID) | ORAL | Status: DC
  Administered 2020-08-01 – 2020-08-05 (×8): 40 mg via ORAL
  Filled 2020-08-01 (×8): qty 1

## 2020-08-01 MED ORDER — IPRATROPIUM-ALBUTEROL 0.5-2.5 (3) MG/3ML IN SOLN
3.0000 mL | Freq: Once | RESPIRATORY_TRACT | Status: DC
  Filled 2020-08-01: qty 3

## 2020-08-01 MED ORDER — UMECLIDINIUM BROMIDE 62.5 MCG/INH IN AEPB
1.0000 | INHALATION_SPRAY | Freq: Every day | RESPIRATORY_TRACT | Status: DC
  Administered 2020-08-02 – 2020-08-05 (×4): 1 via RESPIRATORY_TRACT
  Filled 2020-08-01: qty 7

## 2020-08-01 MED ORDER — ADULT MULTIVITAMIN W/MINERALS CH
1.0000 | ORAL_TABLET | Freq: Every day | ORAL | Status: DC
  Administered 2020-08-01 – 2020-08-05 (×5): 1 via ORAL
  Filled 2020-08-01 (×5): qty 1

## 2020-08-01 MED ORDER — DILTIAZEM HCL 25 MG/5ML IV SOLN
INTRAVENOUS | Status: AC
  Administered 2020-08-01: 10 mg via INTRAVENOUS
  Filled 2020-08-01: qty 5

## 2020-08-01 MED ORDER — SUCRALFATE 1 GM/10ML PO SUSP
1.0000 g | Freq: Four times a day (QID) | ORAL | Status: DC
  Administered 2020-08-01 – 2020-08-05 (×17): 1 g via ORAL
  Filled 2020-08-01 (×22): qty 10

## 2020-08-01 MED ORDER — METHYLPREDNISOLONE SODIUM SUCC 40 MG IJ SOLR
40.0000 mg | Freq: Two times a day (BID) | INTRAMUSCULAR | Status: AC
  Administered 2020-08-02: 40 mg via INTRAVENOUS
  Filled 2020-08-01: qty 1

## 2020-08-01 MED ORDER — IPRATROPIUM-ALBUTEROL 0.5-2.5 (3) MG/3ML IN SOLN
3.0000 mL | Freq: Four times a day (QID) | RESPIRATORY_TRACT | Status: DC | PRN
  Administered 2020-08-03: 3 mL via RESPIRATORY_TRACT
  Filled 2020-08-01: qty 3

## 2020-08-01 MED ORDER — ENOXAPARIN SODIUM 40 MG/0.4ML ~~LOC~~ SOLN: 40.0000 mg | SUBCUTANEOUS | Status: DC

## 2020-08-01 MED ORDER — HEPARIN (PORCINE) 25000 UT/250ML-% IV SOLN
850.0000 [IU]/h | INTRAVENOUS | Status: DC
  Administered 2020-08-01 – 2020-08-02 (×2): 850 [IU]/h via INTRAVENOUS
  Filled 2020-08-01 (×2): qty 250

## 2020-08-01 MED ORDER — DILTIAZEM HCL 25 MG/5ML IV SOLN: 10.0000 mg | Freq: Once | INTRAVENOUS | Status: AC

## 2020-08-01 MED ORDER — HEPARIN BOLUS VIA INFUSION
2800.0000 [IU] | Freq: Once | INTRAVENOUS | Status: AC
  Administered 2020-08-01: 2800 [IU] via INTRAVENOUS
  Filled 2020-08-01: qty 2800

## 2020-08-01 MED ORDER — FLUTICASONE-UMECLIDIN-VILANT 100-62.5-25 MCG/INH IN AEPB: 1.0000 | INHALATION_SPRAY | Freq: Every day | RESPIRATORY_TRACT | Status: DC

## 2020-08-01 MED ORDER — CALCIUM CARBONATE-VITAMIN D 500-200 MG-UNIT PO TABS
1.0000 | ORAL_TABLET | Freq: Every day | ORAL | Status: DC
  Administered 2020-08-01 – 2020-08-05 (×5): 1 via ORAL
  Filled 2020-08-01 (×5): qty 1

## 2020-08-01 NOTE — H&P (Addendum)
History and Physical    Danielle [name] SHF:[id number] DOB: 01/31/1956 DOA: 08/01/2020  PCP: [facility]   Patient coming from: Home  I have personally briefly reviewed patient's old medical records in [place]  Chief Complaint: Shortness of breath  HPI: Danielle [name] is a 64 y.o. female with medical history significant for COPD with chronic respiratory failure on 2 L of oxygen as needed, hypertension, chronic diastolic dysfunction CHF and stage III chronic kidney disease who presents to the ER for evaluation of worsening shortness of breath from her baseline.  Patient states that she is usually short of breath with moderate to severe exertion but over the last couple of days she is now short of breath even with mild exertion and sometimes at rest.  She denies having any chest pain.  She denies having any lower extremity swelling.  She has an occasional nonproductive cough but denies having any fever.  She denies having any orthopnea or paroxysmal nocturnal dyspnea. She has no abdominal pain, no nausea, no vomiting or any changes in her bowel habits.  Per EMS patient had room air pulse oximetry of 85% in the field requiring oxygen supplementation at 3 L to maintain pulse oximetry of 90%. Labs show sodium 134, potassium 4.5, chloride 98, bicarb 25, glucose 80, BUN 17, creatinine 1.3, calcium 9.2, BNP 849.5, troponin 18, lactic acid 0.9, procalcitonin less than 0.10, white cell count 7.5, hemoglobin 9.9, hematocrit 28.6, MCV 96.3, RDW 14.3, platelet count 181, fibrin derivatives 1618, Respiratory viral panel is negative Chest x-ray reviewed by me shows small right pleural effusion with basilar atelectasis.  No focal consolidation.   ED Course: Patient is a 64 year old female with a history of COPD and chronic respiratory failure on 2 L of oxygen as needed who presents to the ER via EMS for evaluation of worsening shortness of breath from her baseline.  She was noted to  be hypoxic in the field with room air pulse oximetry of 85% and required oxygen supplementation at 3 L to improve her pulse oximetry to 90%.  Patient received systemic steroids and nebulizer treatment in the ER and will be admitted to the hospital for further evaluation.  Review of Systems: As per HPI otherwise 10 point review of systems negative.    Past Medical History:  Diagnosis Date   Anxiety    Arthritis    Breast cancer ([place]) 2000   Cancer ([place]) left    breast cancer 2000, chemo tx's with total mastectomy and lymph nodes resected.    Cancer of right lung ([place]) 02/21/2016   rad tx's.    CHF (congestive heart failure) (HCC)    COPD (chronic obstructive pulmonary disease) (HCC)    Dependence on supplemental oxygen    Depression    Heart murmur    Hypertension    Lung nodule    Lymphedema    Personal history of chemotherapy    Personal history of radiation therapy    Shortness of breath dyspnea    with exertion   Status post chemotherapy 2001   left breast cancer   Status post radiation therapy 2001   left breast cancer    Past Surgical History:  Procedure Laterality Date   Breast Biospy Left    [facility]   BREAST SURGERY     COLONOSCOPY N/A 04/30/2018   Procedure: COLONOSCOPY;  Surgeon: Virgel Manifold, MD;  Location: [facility] ENDOSCOPY;  Service: Endoscopy;  Laterality: N/A;   COLONOSCOPY N/A 07/22/2018  Procedure: COLONOSCOPY;  Surgeon: Virgel Manifold, MD;  Location: [facility] ENDOSCOPY;  Service: Endoscopy;  Laterality: N/A;   DILATION AND CURETTAGE OF UTERUS     ELECTROMAGNETIC NAVIGATION BROCHOSCOPY Right 04/11/2016   Procedure: ELECTROMAGNETIC NAVIGATION BRONCHOSCOPY;  Surgeon: Vilinda Boehringer, MD;  Location: [facility] ORS;  Service: Cardiopulmonary;  Laterality: Right;   ESOPHAGOGASTRODUODENOSCOPY N/A 07/22/2018   Procedure: ESOPHAGOGASTRODUODENOSCOPY (EGD);  Surgeon: Virgel Manifold, MD;  [facility] ENDOSCOPY;  Service: Endoscopy;   Laterality: N/A;   ESOPHAGOGASTRODUODENOSCOPY (EGD) WITH PROPOFOL N/A 05/07/2018   Procedure: ESOPHAGOGASTRODUODENOSCOPY (EGD) WITH PROPOFOL;  Surgeon: Lucilla Lame, MD;  Location[facility] ENDOSCOPY;  Service: Endoscopy;  Laterality: N/A;   ESOPHAGOGASTRODUODENOSCOPY (EGD) WITH PROPOFOL N/A 04/24/2019   Procedure: ESOPHAGOGASTRODUODENOSCOPY (EGD) WITH PROPOFOL;  Surgeon: Jonathon Bellows, MD;  Location: [facility] ENDOSCOPY;  Service: Gastroenterology;  Laterality: N/A;   ESOPHAGOGASTRODUODENOSCOPY (EGD) WITH PROPOFOL N/A 01/12/2020   Procedure: ESOPHAGOGASTRODUODENOSCOPY (EGD) WITH PROPOFOL;  Surgeon: Jonathon Bellows, MD;  Location: [facility] ENDOSCOPY;  Service: Gastroenterology;  Laterality: N/A;   ESOPHAGOGASTRODUODENOSCOPY (EGD) WITH PROPOFOL N/A 04/28/2020   Procedure: ESOPHAGOGASTRODUODENOSCOPY (EGD) WITH PROPOFOL;  Surgeon: Jonathon Bellows, MD;  Location: [facility] ENDOSCOPY;  Service: Gastroenterology;  Laterality: N/A;   EUS N/A 05/07/2019   Procedure: FULL UPPER ENDOSCOPIC ULTRASOUND (EUS) RADIAL;  Surgeon: Jola Schmidt, MD;  Location: [facility] ENDOSCOPY;  Service: Endoscopy;  Laterality: N/A;   history of colonoscopy]     ILEOSCOPY N/A 07/22/2018   Procedure: ILEOSCOPY THROUGH STOMA;  Surgeon: Virgel Manifold, MD;  Location: [facility] ENDOSCOPY;  Service: Endoscopy;  Laterality: N/A;   ILEOSTOMY     ILEOSTOMY N/A 09/08/2018   Procedure: ILEOSTOMY REVISION POSSIBLE CREATION;  Surgeon: Herbert Pun, MD;  Location: [facility] ORS;  Service: General;  Laterality: N/A;   ILEOSTOMY CLOSURE N/A 08/15/2018   Procedure: DILATION OF ILEOSTOMY STRICTURE;  Surgeon: Herbert Pun, MD;  Location: [facility] ORS;  Service: General;  Laterality: N/A;   LAPAROTOMY Right 05/04/2018   Procedure: EXPLORATORY LAPAROTOMY right colectomy right and left ostomy;  Surgeon: Herbert Pun, MD;  Location: [facility] ORS;  Service: General;  Laterality: Right;   LUNG BIOPSY     MASTECTOMY Left    2000, [place]  Right    [place]     reports that she quit smoking about 8 years ago. Her smoking use included cigarettes. She has a 10.00 pack-year smoking history. Her smokeless tobacco use includes snuff. She reports previous alcohol use. She reports that she does not use drugs.  No Known Allergies  Family History  Problem Relation Age of Onset   Breast cancer Mother 38   Cancer Mother        Breast    Cirrhosis Father    Breast cancer Paternal Aunt 68   Cancer Maternal Aunt        Breast      Prior to Admission medications   Medication Sig Start Date End Date Taking? Authorizing Provider  acetaminophen (TYLENOL) 325 MG tablet Take 2 tablets (650 mg total) by mouth every 6 (six) hours as needed for mild pain (or Fever >/= 101). 12/24/18  Yes Gouru, Illene Silver, MD  albuterol (PROVENTIL HFA;VENTOLIN HFA) 108 (90 Base) MCG/ACT inhaler Inhale 2 puffs into the lungs every 6 (six) hours as needed for wheezing or shortness of breath. 04/24/18  Yes Demetrios Loll, MD  amLODipine (NORVASC) 5 MG tablet Take 5 mg by mouth daily.    Yes [provider]  calcium carbonate (CALCIUM 600) 1500 (600 Ca) MG TABS tablet  Take 1 tablet by mouth daily.   Yes [provider]  carvedilol (COREG) 6.25 MG tablet Take 1 tablet (6.25 mg total) by mouth 2 (two) times daily with a meal. 11/11/19 06/29/23 Yes Sreenath, Sudheer B, MD  cholecalciferol (VITAMIN D3) 25 MCG (1000 UNIT) tablet Take 1,000 Units by mouth daily.   Yes [provider]  citalopram (CELEXA) 40 MG tablet Take 40 mg by mouth daily.    Yes [provider]  ferrous sulfate 325 (65 FE) MG tablet Take 1 tablet (325 mg total) by mouth 2 (two) times daily with a meal. 07/02/18  Yes [name], MD  Fluticasone-Umeclidin-Vilant 100-62.5-25 MCG/INH AEPB Inhale 1 puff into the lungs daily.  01/07/19  Yes [provider]  Multiple Vitamin (MULTIVITAMIN WITH MINERALS) TABS tablet Take 1 tablet by mouth daily. 06/06/18  Yes  Fritzi Mandes, MD  sodium bicarbonate 650 MG tablet Take 2 tablets (1,300 mg total) by mouth 3 (three) times daily. 10/21/19  Yes Wieting, Richard, MD  torsemide (DEMADEX) 20 MG tablet Take 20 mg by mouth daily as needed (swelling).    Yes [provider]  Nutritional Supplements (FEEDING SUPPLEMENT, NEPRO CARB STEADY,) LIQD Take 237 mLs by mouth 3 (three) times daily between meals. 10/21/19   Loletha Grayer, MD  pantoprazole (PROTONIX) 40 MG tablet Take 1 tablet (40 mg total) by mouth 2 (two) times daily before a meal. 07/23/18   Sudini, Srikar, MD  sucralfate (CARAFATE) 1 GM/10ML suspension Take 10 mLs (1 g total) by mouth 4 (four) times daily. 08/18/19   Noreene Filbert, MD    Physical Exam: Vitals:   08/01/20 1200 08/01/20 1205 08/01/20 1210 08/01/20 1230  BP: (!) 124/56   121/83  Pulse: 92 (!) 109 (!) 154 (!) 137  Resp: (!) 24 (!) 33 (!) 29 (!) 21  Temp:      TempSrc:      SpO2: 100% 99% 100% 99%  Weight:      Height:         Vitals:   08/01/20 1200 08/01/20 1205 08/01/20 1210 08/01/20 1230  BP: (!) 124/56   121/83  Pulse: 92 (!) 109 (!) 154 (!) 137  Resp: (!) 24 (!) 33 (!) 29 (!) 21  Temp:      TempSrc:      SpO2: 100% 99% 100% 99%  Weight:      Height:        Constitutional: NAD, alert and oriented x 3 Eyes: PERRL, lids and conjunctivae normal ENMT: Mucous membranes are moist.  Neck: normal, supple, no masses, no thyromegaly Respiratory: Air entry in all lung fields, faint wheezing, no crackles. Normal respiratory effort. No accessory muscle use.  Cardiovascular: Regular rate and rhythm, no murmurs / rubs / gallops. No extremity edema. 2+ pedal pulses. No carotid bruits.  Abdomen: no tenderness, no masses palpated. No hepatosplenomegaly. Bowel sounds positive.  Colostomy bag in place Musculoskeletal: no clubbing / cyanosis. No joint deformity upper and lower extremities.  Skin: no rashes, lesions, ulcers.  Neurologic: No gross focal neurologic  deficit. Psychiatric: Normal mood and affect.   Labs on Admission: I have personally reviewed following labs and imaging studies  CBC: Recent Labs  Lab 07/27/20 1016 07/31/20 1812  WBC  --  7.5  HGB 10.9* 9.9*  HCT 31.7* 28.6*  MCV  --  96.3  PLT  --  778   Basic Metabolic Panel: Recent Labs  Lab 07/31/20 1812  NA 134*  K 4.5  CL  98  CO2 25  GLUCOSE 80  BUN 17  CREATININE 1.30*  CALCIUM 9.2   GFR: Estimated Creatinine Clearance: 37.2 mL/min (A) (by C-G formula based on SCr of 1.3 mg/dL (H)). Liver Function Tests: No results for input(s): AST, ALT, ALKPHOS, BILITOT, PROT, ALBUMIN in the last 168 hours. No results for input(s): LIPASE, AMYLASE in the last 168 hours. No results for input(s): AMMONIA in the last 168 hours. Coagulation Profile: No results for input(s): INR, PROTIME in the last 168 hours. Cardiac Enzymes: No results for input(s): CKTOTAL, CKMB, CKMBINDEX, TROPONINI in the last 168 hours. BNP (last 3 results) No results for input(s): PROBNP in the last 8760 hours. HbA1C: No results for input(s): HGBA1C in the last 72 hours. CBG: No results for input(s): GLUCAP in the last 168 hours. Lipid Profile: No results for input(s): CHOL, HDL, LDLCALC, TRIG, CHOLHDL, LDLDIRECT in the last 72 hours. Thyroid Function Tests: No results for input(s): TSH, T4TOTAL, FREET4, T3FREE, THYROIDAB in the last 72 hours. Anemia Panel: No results for input(s): VITAMINB12, FOLATE, FERRITIN, TIBC, IRON, RETICCTPCT in the last 72 hours. Urine analysis:    Component Value Date/Time   COLORURINE YELLOW (A) 01/20/2020 0821   APPEARANCEUR CLEAR (A) 01/20/2020 0821   APPEARANCEUR Clear 12/06/2013 2245   LABSPEC 1.009 01/20/2020 0821   LABSPEC 1.015 12/06/2013 2245   PHURINE 5.0 01/20/2020 0821   GLUCOSEU NEGATIVE 01/20/2020 0821   GLUCOSEU Negative 12/06/2013 2245   HGBUR NEGATIVE 01/20/2020 0821   BILIRUBINUR NEGATIVE 01/20/2020 0821   BILIRUBINUR Negative 12/06/2013 2245    KETONESUR 5 (A) 01/20/2020 0821   PROTEINUR NEGATIVE 01/20/2020 0821   NITRITE NEGATIVE 01/20/2020 0821   LEUKOCYTESUR NEGATIVE 01/20/2020 0821   LEUKOCYTESUR Negative 12/06/2013 2245    Radiological Exams on Admission: DG Chest 2 View  Result Date: 07/31/2020 CLINICAL DATA:  Shortness of breath EXAM: CHEST - 2 VIEW COMPARISON:  None FINDINGS: Mild bibasilar opacities with small right pleural effusion. Unchanged right hilar prominence. No focal airspace consolidation. IMPRESSION: Small right pleural effusion with basilar atelectasis. Electronically Signed   By: Ulyses Jarred M.D.   On: 07/31/2020 19:12    EKG: Independently reviewed.   Assessment/Plan Principal Problem:   COPD with acute exacerbation (HCC) Active Problems:   HTN (hypertension)   Atrial fibrillation (HCC)   Anemia of chronic kidney failure, stage 4 (severe) (HCC)   Depression   Chronic diastolic CHF (congestive heart failure) (HCC)     COPD with acute exacerbation Patient has a history of COPD with chronic respiratory failure and is on 2 L of oxygen at home as needed which she rarely uses She presents to the ER for evaluation of worsening shortness of breath from her baseline and was hypoxic in the field with room air pulse oximetry of 85% requiring 3 L of oxygen to maintain pulse oximetry greater than 90% We will place patient on scheduled and as needed bronchodilator therapy Place patient on inhaled steroids Supportive care with antitussives    Hypertension Continue amlodipine and carvedilol   Depression Continue citalopram   Chronic diastolic dysfunction CHF Stable and not in acute exacerbation Continue torsemide and carvedilol    Chronic kidney disease stage III Renal function appears stable Continue bicarbonate supplement   Anemia of chronic kidney disease H&H is stable   Atrial fibrillation with rapid ventricular rate Patient has a known history of A. fib and is currently tachycardic  with heart rates in the 150s She has a CHADS2 VASC score of 4 and  ideally requires long-term anticoagulation as primary prophylaxis for an acute stroke We will start patient on a Cardizem drip for rate control We will consult cardiology   DVT prophylaxis: Lovenox Code Status: Full code Family Communication: Greater than 50% of time was spent discussing patient's condition and plan of care with her at the bedside.  All questions and concerns have been addressed.  She verbalizes understanding and agrees with the plan. Disposition Plan: Back to previous home environment Consults called: None    Quin Mathenia MD [facility]     08/01/2020, 1:12 PM

## 2020-08-01 NOTE — ED Notes (Signed)
IV team at bedside for additional IV access for CT angio

## 2020-08-01 NOTE — ED Notes (Signed)
Admitting at bedside 

## 2020-08-01 NOTE — ED Notes (Signed)
Needs resp panel, repeat troponin   Lucrezia Starch, MD 08/01/20 [phone]

## 2020-08-01 NOTE — ED Provider Notes (Signed)
[facility] Emergency Department Provider Note  Time seen: 10:50 AM  I have reviewed the triage vital signs and the nursing notes.   HISTORY  Chief Complaint Shortness of Breath   HPI Danielle [name] is a 64 y.o. female with a past medical history of CHF, COPD, hypertension, CKD, presents to the emergency department for shortness of breath.  According to the patient she has baseline COPD is prescribed oxygen to be used at night if needed, states she does not typically use oxygen at night or during the day.  She states over the past 2 days however she has had worsening shortness of breath especially with exertion.  Patient states slight cough but denies any fever.  Denies any chest pain.   Past Medical History:  Diagnosis Date  . Anxiety   . Arthritis   . Breast cancer ([place]) 2000  . Cancer ([place]) left    breast cancer 2000, chemo tx's with total mastectomy and lymph nodes resected.   . Cancer of right lung ([place]) 02/21/2016   rad tx's.   . CHF (congestive heart failure) ([place])   . COPD (chronic obstructive pulmonary disease) ([place])   . Dependence on supplemental oxygen   . Depression   . Heart murmur   . Hypertension   . Lung nodule   . Lymphedema   . Personal history of chemotherapy   . Personal history of radiation therapy   . Shortness of breath dyspnea    with exertion  . Status post chemotherapy 2001   left breast cancer  . Status post radiation therapy 2001   left breast cancer    Patient Active Problem List   Diagnosis Date Noted  . Cellulitis of left arm 01/20/2020  . CKD (chronic kidney disease), stage IV ([place]) 01/20/2020  . Hypokalemia 01/20/2020  . Elevated troponin 01/20/2020  . Sepsis ([place]) 01/20/2020  . Chronic diastolic CHF (congestive heart failure) ([place]) 01/20/2020  . CHF exacerbation ([place]) 11/06/2019  . Diarrhea   . Acute renal failure superimposed on stage 3b chronic kidney disease ([place]) 10/29/2019  . GERD (gastroesophageal  reflux disease) 10/29/2019  . Depression 10/29/2019  . Hyperkalemia 10/14/2019  . History of 2019 novel coronavirus disease (COVID-19) 10/14/2019  . Acute respiratory failure due to COVID-19 ([place]) 09/27/2019  . Respiratory tract infection due to COVID-19 virus 09/27/2019  . Acute blood loss anemia 06/20/2019  . Anemia 06/20/2019  . GI bleeding 06/19/2019  . Anemia of chronic kidney failure, stage 4 (severe) ([place]) 06/12/2019  . Malignant neoplasm of lower third of esophagus ([place]) 05/01/2019  . Acute on chronic respiratory failure with hypoxia and hypercapnia ([place]) 12/22/2018  . Ileostomy dysfunction ([place]) 09/28/2018  . Reflux esophagitis   . Iron deficiency anemia secondary to blood loss (chronic)   . SBO (small bowel obstruction) ([place]) 06/29/2018  . Chronic systolic heart failure ([place]) 06/09/2018  . HTN (hypertension) 06/09/2018  . Atrial fibrillation ([place]) 06/09/2018  . Lymphedema 06/09/2018  . Acute kidney injury superimposed on CKD ([place]) 06/04/2018  . Severe protein-calorie malnutrition ([place]) 06/04/2018  . Blood in stool   . Focal (segmental) acute (reversible) ischemia of large intestine ([place])   . Ulceration of intestine   . Diverticulosis of large intestine without diverticulitis   . Abnormal CT scan, colon   . Colitis   . COPD exacerbation ([place])   . Malnutrition of moderate degree 04/23/2018  . Palliative care by specialist   . DNR (do not resuscitate) discussion   . Weakness  generalized   . Hyponatremia 02/10/2017  . Syncope 02/09/2017  . Lung nodule, solitary 07/25/2016  . Malignant neoplasm of upper lobe of right lung ([place]) 07/25/2016  . Carcinoma of overlapping sites of left breast in female, estrogen receptor positive ([place]) 06/22/2016  . Multiple lung nodules 06/22/2016  . Lesion of right lung   . Breast cancer in female [facility]) 02/21/2016  . Moderate COPD (chronic obstructive pulmonary disease) ([place]) 08/24/2014    Past Surgical History:  Procedure Laterality  Date  . Breast Biospy Left    [facility]  . BREAST SURGERY    . COLONOSCOPY N/A 04/30/2018   Procedure: COLONOSCOPY;  Surgeon: Virgel Manifold, MD;  Location: [facility] ENDOSCOPY;  Service: Endoscopy;  Laterality: N/A;  . COLONOSCOPY N/A 07/22/2018   Procedure: COLONOSCOPY;  Surgeon: Virgel Manifold, MD;  Location: [facility] ENDOSCOPY;  Service: Endoscopy;  Laterality: N/A;  . DILATION AND CURETTAGE OF UTERUS    . ELECTROMAGNETIC NAVIGATION BROCHOSCOPY Right 04/11/2016   Procedure: ELECTROMAGNETIC NAVIGATION BRONCHOSCOPY;  Surgeon: Vilinda Boehringer, MD;  Location: [facility] ORS;  Service: Cardiopulmonary;  Laterality: Right;  . ESOPHAGOGASTRODUODENOSCOPY N/A 07/22/2018   Procedure: ESOPHAGOGASTRODUODENOSCOPY (EGD);  Surgeon: Virgel Manifold, MD;  Location[facility] ENDOSCOPY;  Service: Endoscopy;  Laterality: N/A;  . ESOPHAGOGASTRODUODENOSCOPY (EGD) WITH PROPOFOL N/A 05/07/2018   Procedure: ESOPHAGOGASTRODUODENOSCOPY (EGD) WITH PROPOFOL;  Surgeon: Lucilla Lame, MD;  Location: [facility] ENDOSCOPY;  Service: Endoscopy;  Laterality: N/A;  . ESOPHAGOGASTRODUODENOSCOPY (EGD) WITH PROPOFOL N/A 04/24/2019   Procedure: ESOPHAGOGASTRODUODENOSCOPY (EGD) WITH PROPOFOL;  Surgeon: Jonathon Bellows, MD;  Location: [facility] ENDOSCOPY;  Service: Gastroenterology;  Laterality: N/A;  . ESOPHAGOGASTRODUODENOSCOPY (EGD) WITH PROPOFOL N/A 01/12/2020   Procedure: ESOPHAGOGASTRODUODENOSCOPY (EGD) WITH PROPOFOL;  Surgeon: Jonathon Bellows, MD;  Location: [facility] ENDOSCOPY;  Service: Gastroenterology;  Laterality: N/A;  . ESOPHAGOGASTRODUODENOSCOPY (EGD) WITH PROPOFOL N/A 04/28/2020   Procedure: ESOPHAGOGASTRODUODENOSCOPY (EGD) WITH PROPOFOL;  Surgeon: Jonathon Bellows, MD;  Location: [facility] ENDOSCOPY;  Service: Gastroenterology;  Laterality: N/A;  . EUS N/A 05/07/2019   Procedure: FULL UPPER ENDOSCOPIC ULTRASOUND (EUS) RADIAL;  Surgeon: Jola Schmidt, MD;  Location: [facility] ENDOSCOPY;  Service: Endoscopy;  Laterality: N/A;  . history of colonoscopy]    . ILEOSCOPY N/A  07/22/2018   Procedure: ILEOSCOPY THROUGH STOMA;  Surgeon: Virgel Manifold, MD;  Location: [facility] ENDOSCOPY;  Service: Endoscopy;  Laterality: N/A;  . ILEOSTOMY    . ILEOSTOMY N/A 09/08/2018   Procedure: ILEOSTOMY REVISION POSSIBLE CREATION;  Surgeon: Herbert Pun, MD;  Location: [facility] ORS;  Service: General;  Laterality: N/A;  . ILEOSTOMY CLOSURE N/A 08/15/2018   Procedure: DILATION OF ILEOSTOMY STRICTURE;  Surgeon: Herbert Pun, MD;  Location: [facility] ORS;  Service: General;  Laterality: N/A;  . LAPAROTOMY Right 05/04/2018   Procedure: EXPLORATORY LAPAROTOMY right colectomy right and left ostomy;  Surgeon: Herbert Pun, MD;  Location: [facility] ORS;  Service: General;  Laterality: Right;  . LUNG BIOPSY    . MASTECTOMY Left    2000, [facility]  . ROTATOR CUFF REPAIR Right    [facility]    Prior to Admission medications   Medication Sig Start Date End Date Taking? Authorizing Provider  acetaminophen (TYLENOL) 325 MG tablet Take 2 tablets (650 mg total) by mouth every 6 (six) hours as needed for mild pain (or Fever >/= 101). 12/24/18   Gouru, Illene Silver, MD  albuterol (PROVENTIL HFA;VENTOLIN HFA) 108 (90 Base) MCG/ACT inhaler Inhale 2 puffs into the lungs every 6 (six) hours as needed for wheezing or shortness of breath. 04/24/18   Demetrios Loll, MD  amLODipine (NORVASC) 5 MG tablet Take by mouth. 02/11/20 02/10/21  [provider]  calcium carbonate (TUMS - DOSED IN MG ELEMENTAL CALCIUM) 500 MG chewable tablet Chew 0.5 tablets (100 mg of elemental calcium total) by mouth daily. 11/05/19   Enzo Bi, MD  calcium-vitamin D (OSCAL WITH D) 250-125 MG-UNIT tablet Take 1 tablet by mouth daily.    [provider]  carvedilol (COREG) 6.25 MG tablet Take 1 tablet (6.25 mg total) by mouth 2 (two) times daily with a meal. 11/11/19 06/29/23  Sreenath, Sudheer B, MD  citalopram (CELEXA) 40 MG tablet Take 40 mg by mouth daily.     [provider]  ferrous sulfate 325 (65 FE) MG tablet  Take 1 tablet (325 mg total) by mouth 2 (two) times daily with a meal. 07/02/18   [name], MD  Fluticasone-Umeclidin-Vilant 100-62.5-25 MCG/INH AEPB Inhale 1 puff into the lungs daily.  01/07/19   [provider]  guaiFENesin-dextromethorphan (ROBITUSSIN DM) 100-10 MG/5ML syrup Take 5 mLs by mouth every 4 (four) hours as needed for cough. Patient not taking: Reported on 06/28/2020    [provider]  loperamide (IMODIUM A-D) 2 MG tablet Take 1 tablet (2 mg total) by mouth as needed for diarrhea or loose stools. Patient not taking: Reported on 06/28/2020 10/04/18   Herbert Pun, MD  Multiple Vitamin (MULTIVITAMIN WITH MINERALS) TABS tablet Take 1 tablet by mouth daily. 06/06/18   Fritzi Mandes, MD  Nutritional Supplements (FEEDING SUPPLEMENT, NEPRO CARB STEADY,) LIQD Take 237 mLs by mouth 3 (three) times daily between meals. 10/21/19   Loletha Grayer, MD  pantoprazole (PROTONIX) 40 MG tablet Take 1 tablet (40 mg total) by mouth 2 (two) times daily before a meal. 07/23/18   Sudini, Alveta Heimlich, MD  sodium bicarbonate 650 MG tablet Take 2 tablets (1,300 mg total) by mouth 3 (three) times daily. 10/21/19   Loletha Grayer, MD  sucralfate (CARAFATE) 1 GM/10ML suspension Take 10 mLs (1 g total) by mouth 4 (four) times daily. 08/18/19   Noreene Filbert, MD  torsemide (DEMADEX) 20 MG tablet Take 1 tablet by mouth daily as needed (swelling).  12/02/19 12/01/20  [provider]    No Known Allergies  Family History  Problem Relation Age of Onset  . Breast cancer Mother 30  . Cancer Mother        Breast   . Cirrhosis Father   . Breast cancer Paternal Aunt 17  . Cancer Maternal Aunt        Breast     Social History Social History   Tobacco Use  . Smoking status: Former Smoker    Packs/day: 0.50    Years: 20.00    Pack years: 10.00    Types: Cigarettes    Quit date: 07/02/2012    Years since quitting: 8.0  . Smokeless tobacco: Current User    Types: Snuff   . Tobacco comment: quit 2014  Vaping Use  . Vaping Use: Never used  Substance Use Topics  . Alcohol use: Not Currently    Comment: Occasionally  . Drug use: No    Review of Systems Constitutional: Negative for fever. Cardiovascular: Negative for chest pain. Respiratory: Positive for shortness of breath x2 days. Gastrointestinal: Negative for abdominal pain, vomiting Musculoskeletal: Negative for musculoskeletal complaints Neurological: Negative for headache All other ROS negative  ____________________________________________   PHYSICAL EXAM:  VITAL SIGNS: ED Triage Vitals  Enc Vitals Group     BP 07/31/20 1753 127/74  Pulse Rate 07/31/20 1753 96     Resp 07/31/20 1753 (!) 26     Temp 07/31/20 1753 99.1 F (37.3 C)     Temp Source 07/31/20 1753 Oral     SpO2 07/31/20 1753 93 %     Weight 07/31/20 1755 122 lb (55.3 kg)     Height 07/31/20 1755 5\' 6"  (1.676 m)     Head Circumference --      Peak Flow --      Pain Score 07/31/20 1754 0     Pain Loc --      Pain Edu? --      Excl. in [place]? --    Constitutional: Alert and oriented. Well appearing Eyes: Normal exam ENT      Head: Normocephalic and atraumatic.      Mouth/Throat: Mucous membranes are moist. Cardiovascular: Normal rate, regular rhythm around 100 bpm Respiratory: Moderate tachypnea with diminished lung sounds bilaterally but no obvious wheeze rales or rhonchi. Gastrointestinal: Soft and nontender. No distention.  Ostomy present. Musculoskeletal: Nontender with normal range of motion in all extremities. Neurologic:  Normal speech and language. No gross focal neurologic deficits Skin:  Skin is warm, dry and intact.  Psychiatric: Mood and affect are normal.   ____________________________________________    EKG  EKG viewed and interpreted by myself shows a normal sinus rhythm at 95 bpm with a rightward axis, narrow QRS, largely normal intervals with nonspecific ST changes without  elevation.  ____________________________________________    RADIOLOGY  Chest x-ray shows small right pleural effusion with basilar atelectasis.  ____________________________________________   INITIAL IMPRESSION / ASSESSMENT AND PLAN / ED COURSE  Pertinent labs & imaging results that were available during my care of the patient were reviewed by me and considered in my medical decision making (see chart for details).   Patient presents to the emergency department for worsening shortness of breath over the past 2 days.  Patient found to have initial low oxygen saturation around 90%, after getting back to the room she is satting 85% on room air.  Patient states she does not typically wear oxygen.  Patient is tachypneic around 30 breaths/min.  Chest x-ray shows a small right pleural effusion.  Lab work is otherwise largely nonrevealing.  Patient has diminished breath sounds on physical exam most consistent with COPD exacerbation.  Given her hypoxia to 85% along with tachypnea and borderline tachycardia we will administer breathing treatments, IV steroids and plan to admit to the hospital service for further work-up and treatment of her likely COPD exacerbation.  Patient agreeable to plan of care.  Patient troponin is slightly elevated although largely unchanged from prior values.  Reassuringly lactic acid and procalcitonin are negative.  Covid test is pending.  Danielle [name] was evaluated in Emergency Department on 08/01/2020 for the symptoms described in the history of present illness. She was evaluated in the context of the global COVID-19 pandemic, which necessitated consideration that the patient might be at risk for infection with the SARS-CoV-2 virus that causes COVID-19. Institutional protocols and algorithms that pertain to the evaluation of patients at risk for COVID-19 are in a state of rapid change based on information released by regulatory bodies including the CDC and federal and state  organizations. These policies and algorithms were followed during the patient's care in the ED.  ____________________________________________   FINAL CLINICAL IMPRESSION(S) / ED DIAGNOSES  COPD exacerbation Dyspnea   Harvest Dark, MD 08/01/20 1223

## 2020-08-01 NOTE — ED Notes (Signed)
Patient assisted to the bathroom. Patient able to stand and turn at the commode. Colostomy bag emptied.

## 2020-08-01 NOTE — Progress Notes (Addendum)
ANTICOAGULATION CONSULT NOTE - Initial Consult  Pharmacy Consult for Heparin infusion Indication: atrial fibrillation  No Known Allergies  Patient Measurements: Height: 5\' 6"  (167.6 cm) Weight: 55.3 kg (122 lb) IBW/kg (Calculated) : 59.3 Heparin Dosing Weight: 55.3 kg  Vital Signs: Temp: 98.7 F (37.1 C) (10/18 1012) Temp Source: Oral (10/18 1012) BP: 112/72 (10/18 1530) Pulse Rate: 105 (10/18 1530)  Labs: Recent Labs    07/31/20 1812 08/01/20 1025  HGB 9.9*  --   HCT 28.6*  --   PLT 181  --   CREATININE 1.30*  --   TROPONINIHS 23* 18*    Estimated Creatinine Clearance: 37.2 mL/min (A) (by C-G formula based on SCr of 1.3 mg/dL (H)).   Medical History: Past Medical History:  Diagnosis Date  . Anxiety   . Arthritis   . Breast cancer ([place]) 2000  . Cancer ([place]) left    breast cancer 2000, chemo tx's with total mastectomy and lymph nodes resected.   . Cancer of right lung ([place]) 02/21/2016   rad tx's.   . CHF (congestive heart failure) ([place])   . COPD (chronic obstructive pulmonary disease) ([place])   . Dependence on supplemental oxygen   . Depression   . Heart murmur   . Hypertension   . Lung nodule   . Lymphedema   . Personal history of chemotherapy   . Personal history of radiation therapy   . Shortness of breath dyspnea    with exertion  . Status post chemotherapy 2001   left breast cancer  . Status post radiation therapy 2001   left breast cancer    Medications:  Confirmed with patient that she was not on any recent anticoagulation prior to admission (pt stated she has not had Eliquis in over a year)  Assessment: 64yo female with PMH for COPD with chronic respiratory failure on 2 L of oxygen as needed, hypertension, chronic diastolic dysfunction CHF and stage III chronic kidney disease, anemia. In the ED, patient developed afib with RVR - started on diltiazem drip. Patient reports history of GI bleed with blood in her colostomy bag while on Eliquis ~2 years  ago. Patient does not recollect any prior history of Afib. Pharmacy has been consulted for heparin dosing and monitoring.  Baseline labs Hgb 9.9/ Hct 28.6  - seems to be her baseline Plt 181 aPTT and PT-INR pending  Goal of Therapy:  Heparin level 0.3-0.7 units/ml Monitor platelets by anticoagulation protocol: Yes   Plan:  Give 2800 units bolus x 1 Start heparin infusion at 850 units/hr Check anti-Xa level in 6 hours and daily while on heparin Continue to monitor H&H and platelets  Cardio deferring chronic anticoagulation in light of history of GIB on Eliquis and ongoing anemia; long term solution such as Watchman device, per Dr. [name], as outpatient.  Sherilyn Banker, PharmD Pharmacy Resident  08/01/2020 4:50 PM

## 2020-08-01 NOTE — ED Notes (Signed)
Patient HR noted to be 155, patient denies increased shortness of breath or palpitations, repeat EKG performed and given to ED MD.

## 2020-08-01 NOTE — Progress Notes (Signed)
OT Cancellation Note  Patient Details Name: Danielle [name] MRN: [id number] DOB: [birth year]   Cancelled Treatment:    Reason Eval/Treat Not Completed: Medical issues which prohibited therapy. OT order received and chart reviewed. Pt held today secondary to cardiology at bedside and HR 150-160's at rest. OT will follow up when pt is able to participate in OT intervention.   [name], [place], OTR/L , CBIS ascom [phone]  08/01/20, 4:04 PM   08/01/2020, 4:03 PM

## 2020-08-01 NOTE — ED Notes (Signed)
Cardiology at bedside.

## 2020-08-01 NOTE — ED Notes (Signed)
Admitting MD aware of patients HR. Repeat EKG performed and uploaded. Awaiting orders

## 2020-08-01 NOTE — Progress Notes (Signed)
VAST consulted to obtain 2nd IV access. Ultrasound utilized to assess vasculature in pt's right arm. Left arm restricted d/t lymphadema. Pt with small vessels which will not accommodate a 20g IV. 22g USGIV placed in right anterior forearm.

## 2020-08-01 NOTE — Consult Note (Signed)
[facility] Card[facility]  CARDIOLOGY CONSULT NOTE  Patient ID: UVA RUNKEL MRN: [id number] DOB/AGE: [birth year] 64 y.o.  Admit date: 08/01/2020 Referring Physician Ag[place] Primary Physician [facility] Primary Cardiologist [name] Reason for Consultation atrial fibrillation  HPI: 64 year old female referred for evaluation of atrial fibrillation.  Patient has a history of COPD on supplemental oxygen as needed.  He presented to [facility] ED 07/31/2020 worsening shortness of breath.  The patient was noted to be hypoxic, consistent with COPD exacerbation.  On the emergency room, patient developed atrial fibrillation with rapid ventricular rate following a thin treatment.  Started on diltiazem drip at 5 mg/h.  The patient reports a history of GI bleed, with blood in her colostomy bag while on Eliquis approximately 2 years ago.  The patient is unsure why she was on Eliquis.  She does not recollect any prior history of atrial fibrillation.  Patient labs notable for hemoglobin hematocrit of 9.9 and 28.6, respectively.  High-sensitivity troponin was 23 and 18.  Review of systems complete and found to be negative unless listed above     Past Medical History:  Diagnosis Date  . Anxiety   . Arthritis   . Breast cancer ([place]) 2000  . Cancer ([place]) left    breast cancer 2000, chemo tx's with total mastectomy and lymph nodes resected.   . Cancer of right lung ([place]) 02/21/2016   rad tx's.   . CHF (congestive heart failure) ([place])   . COPD (chronic obstructive pulmonary disease) ([place])   . Dependence on supplemental oxygen   . Depression   . Heart murmur   . Hypertension   . Lung nodule   . Lymphedema   . Personal history of chemotherapy   . Personal history of radiation therapy   . Shortness of breath dyspnea    with exertion  . Status post chemotherapy 2001   left breast cancer  . Status post radiation therapy 2001   left breast cancer    Past Surgical History:  Procedure Laterality Date  . Breast  Biospy Left    [facility]  . BREAST SURGERY    . COLONOSCOPY N/A 04/30/2018   Procedure: COLONOSCOPY;  Surgeon: Virgel Manifold, MD;  Location: [facility] ENDOSCOPY;  Service: Endoscopy;  Laterality: N/A;  . COLONOSCOPY N/A 07/22/2018   Procedure: COLONOSCOPY;  Surgeon: Virgel Manifold, MD;  Location: [facility] ENDOSCOPY;  Service: Endoscopy;  Laterality: N/A;  . DILATION AND CURETTAGE OF UTERUS    . ELECTROMAGNETIC NAVIGATION BROCHOSCOPY Right 04/11/2016   Procedure: ELECTROMAGNETIC NAVIGATION BRONCHOSCOPY;  Surgeon: Vilinda Boehringer, MD;  Location: [facility] ORS;  Service: Cardiopulmonary;  Laterality: Right;  . ESOPHAGOGASTRODUODENOSCOPY N/A 07/22/2018   Procedure: ESOPHAGOGASTRODUODENOSCOPY (EGD);  Surgeon: Virgel Manifold, MD;  Location: [facility] ENDOSCOPY;  Service: Endoscopy;  Laterality: N/A;  . ESOPHAGOGASTRODUODENOSCOPY (EGD) WITH PROPOFOL N/A 05/07/2018   Procedure: ESOPHAGOGASTRODUODENOSCOPY (EGD) WITH PROPOFOL;  Surgeon: Lucilla Lame, MD;  Location: [facility] ENDOSCOPY;  Service: Endoscopy;  Laterality: N/A;  . ESOPHAGOGASTRODUODENOSCOPY (EGD) WITH PROPOFOL N/A 04/24/2019   Procedure: ESOPHAGOGASTRODUODENOSCOPY (EGD) WITH PROPOFOL;  Surgeon: Jonathon Bellows, MD;  Location: [facility] ENDOSCOPY;  Service: Gastroenterology;  Laterality: N/A;  . ESOPHAGOGASTRODUODENOSCOPY (EGD) WITH PROPOFOL N/A 01/12/2020   Procedure: ESOPHAGOGASTRODUODENOSCOPY (EGD) WITH PROPOFOL;  Surgeon: Jonathon Bellows, MD;  Location: [facility] ENDOSCOPY;  Service: Gastroenterology;  Laterality: N/A;  . ESOPHAGOGASTRODUODENOSCOPY (EGD) WITH PROPOFOL N/A 04/28/2020   Procedure: ESOPHAGOGASTRODUODENOSCOPY (EGD) WITH PROPOFOL;  Surgeon: Jonathon Bellows, MD;  Location: [facility] ENDOSCOPY;  Service: Gastroenterology;  Laterality: N/A;  . EUS N/A 05/07/2019  Procedure: FULL UPPER ENDOSCOPIC ULTRASOUND (EUS) RADIAL;  Surgeon: Jola Schmidt, MD;  Location: [facility] ENDOSCOPY;  Service: Endoscopy;  Laterality: N/A;  . history of colonoscopy]    . ILEOSCOPY N/A 07/22/2018    Procedure: ILEOSCOPY THROUGH STOMA;  Surgeon: Virgel Manifold, MD;  Location: [facility] ENDOSCOPY;  Service: Endoscopy;  Laterality: N/A;  . ILEOSTOMY    . ILEOSTOMY N/A 09/08/2018   Procedure: ILEOSTOMY REVISION POSSIBLE CREATION;  Surgeon: Herbert Pun, MD;  Location: [facility] ORS;  Service: General;  Laterality: N/A;  . ILEOSTOMY CLOSURE N/A 08/15/2018   Procedure: DILATION OF ILEOSTOMY STRICTURE;  Surgeon: Herbert Pun, MD;  Location: [facility] ORS;  Service: General;  Laterality: N/A;  . LAPAROTOMY Right 05/04/2018   Procedure: EXPLORATORY LAPAROTOMY right colectomy right and left ostomy;  Surgeon: Herbert Pun, MD;  Location: [facility] ORS;  Service: General;  Laterality: Right;  . LUNG BIOPSY    . MASTECTOMY Left    2000, [facility]  . ROTATOR CUFF REPAIR Right    [facility]    (Not in a hospital admission)  Social History   Socioeconomic History  . Marital status: Divorced    Spouse name: Not on file  . Number of children: 3  . Years of education: Not on file  . Highest education level: Not on file  Occupational History  . Occupation: Retired   Tobacco Use  . Smoking status: Former Smoker    Packs/day: 0.50    Years: 20.00    Pack years: 10.00    Types: Cigarettes    Quit date: 07/02/2012    Years since quitting: 8.0  . Smokeless tobacco: Current User    Types: Snuff  . Tobacco comment: quit 2014  Vaping Use  . Vaping Use: Never used  Substance and Sexual Activity  . Alcohol use: Not Currently    Comment: Occasionally  . Drug use: No  . Sexual activity: Not Currently  Other Topics Concern  . Not on file  Social History Narrative  . Not on file   Social Determinants of Health   Financial Resource Strain:   . Difficulty of Paying Living Expenses: Not on file  Food Insecurity:   . Worried About Charity fundraiser in the Last Year: Not on file  . Ran Out of Food in the Last Year: Not on file  Transportation Needs:   . Lack of Transportation (Medical): Not  on file  . Lack of Transportation (Non-Medical): Not on file  Physical Activity:   . Days of Exercise per Week: Not on file  . Minutes of Exercise per Session: Not on file  Stress:   . Feeling of Stress : Not on file  Social Connections:   . Frequency of Communication with Friends and Family: Not on file  . Frequency of Social Gatherings with Friends and Family: Not on file  . Attends Religious Services: Not on file  . Active Member of Clubs or Organizations: Not on file  . Attends Archivist Meetings: Not on file  . Marital Status: Not on file  Intimate Partner Violence:   . Fear of Current or Ex-Partner: Not on file  . Emotionally Abused: Not on file  . Physically Abused: Not on file  . Sexually Abused: Not on file    Family History  Problem Relation Age of Onset  . Breast cancer Mother 78  . Cancer Mother        Breast   . Cirrhosis Father   . Breast cancer Paternal Aunt  25  . Cancer Maternal Aunt        Breast       Review of systems complete and found to be negative unless listed above      PHYSICAL EXAM  General: Well developed, well nourished, in no acute distress HEENT:  Normocephalic and atramatic Neck:  No JVD.  Lungs: Clear bilaterally to auscultation and percussion. Heart: HRRR . Normal S1 and S2 without gallops or murmurs.  Abdomen: Bowel sounds are positive, abdomen soft and non-tender  Msk:  Back normal, normal gait. Normal strength and tone for age. Extremities: No clubbing, cyanosis or edema.   Neuro: Alert and oriented X 3. Psych:  Good affect, responds appropriately  Labs:   Lab Results  Component Value Date   WBC 7.5 07/31/2020   HGB 9.9 (L) 07/31/2020   HCT 28.6 (L) 07/31/2020   MCV 96.3 07/31/2020   PLT 181 07/31/2020    Recent Labs  Lab 07/31/20 1812  NA 134*  K 4.5  CL 98  CO2 25  BUN 17  CREATININE 1.30*  CALCIUM 9.2  GLUCOSE 80   Lab Results  Component Value Date   CKTOTAL 207 06/26/2013   CKMB 1.9  06/27/2013   TROPONINI <0.03 12/24/2018    Lab Results  Component Value Date   CHOL 152 01/21/2020   Lab Results  Component Value Date   HDL 87 01/21/2020   Lab Results  Component Value Date   LDLCALC 53 01/21/2020   Lab Results  Component Value Date   TRIG 60 01/21/2020   TRIG 184 (H) 09/27/2019   TRIG 55 05/12/2018   Lab Results  Component Value Date   CHOLHDL 1.7 01/21/2020   No results found for: LDLDIRECT    Radiology: DG Chest 2 View  Result Date: 07/31/2020 CLINICAL DATA:  Shortness of breath EXAM: CHEST - 2 VIEW COMPARISON:  None FINDINGS: Mild bibasilar opacities with small right pleural effusion. Unchanged right hilar prominence. No focal airspace consolidation. IMPRESSION: Small right pleural effusion with basilar atelectasis. Electronically Signed   By: Ulyses Jarred M.D.   On: 07/31/2020 19:12    EKG: Atrial fibrillation at a rate of 162 bpm  ASSESSMENT AND PLAN:   1.  Atrial fibrillation with rapid ventricular rate, in the setting of COPD exacerbation, modestly improved after starting diltiazem infusion. 2.  COPD exacerbation 3.  History of GI bleed, while on Eliquis, though unclear  indication for Eliquis, patient aware of history of atrial fibrillation 4.  Anemia  Recommendations  1.  Agree with overall current therapy 2.  Diltiazem bolus of 10 mg, uptitrate infusion as needed 3.  Defer chronic anticoagulation in light of history of GI bleed on Eliquis, and ongoing anemia 4.  Long-term solution for stroke prevention such as Watchman device per Dr. [name] as outpatient 5.  Review 2D echocardiogram  Signed: Isaias Cowman MD,PhD, [facility] 08/01/2020, 2:55 PM

## 2020-08-02 ENCOUNTER — Encounter: Payer: Self-pay | Admitting: Internal Medicine

## 2020-08-02 DIAGNOSIS — I5032 Chronic diastolic (congestive) heart failure: Secondary | ICD-10-CM

## 2020-08-02 DIAGNOSIS — I4891 Unspecified atrial fibrillation: Secondary | ICD-10-CM

## 2020-08-02 LAB — BASIC METABOLIC PANEL
Anion gap: 12 (ref 5–15)
BUN: 32 mg/dL — ABNORMAL HIGH (ref 8–23)
CO2: 21 mmol/L — ABNORMAL LOW (ref 22–32)
Calcium: 9 mg/dL (ref 8.9–10.3)
Chloride: 100 mmol/L (ref 98–111)
Creatinine, Ser: 1.66 mg/dL — ABNORMAL HIGH (ref 0.44–1.00)
GFR, Estimated: 32 mL/min — ABNORMAL LOW (ref >60)
Glucose, Bld: 154 mg/dL — ABNORMAL HIGH (ref 70–99)
Potassium: 5.3 mmol/L — ABNORMAL HIGH (ref 3.5–5.1)
Sodium: 133 mmol/L — ABNORMAL LOW (ref 135–145)

## 2020-08-02 LAB — T4, FREE: Free T4: 0.75 ng/dL (ref 0.61–1.12)

## 2020-08-02 LAB — CBC
HCT: 24.8 % — ABNORMAL LOW (ref 36.0–46.0)
Hemoglobin: 8.4 g/dL — ABNORMAL LOW (ref 12.0–15.0)
MCH: 33.1 pg (ref 26.0–34.0)
MCHC: 33.9 g/dL (ref 30.0–36.0)
MCV: 97.6 fL (ref 80.0–100.0)
Platelets: 182 10*3/uL (ref 150–400)
RBC: 2.54 MIL/uL — ABNORMAL LOW (ref 3.87–5.11)
RDW: 14.5 % (ref 11.5–15.5)
WBC: 3.6 10*3/uL — ABNORMAL LOW (ref 4.0–10.5)
nRBC: 0.8 % — ABNORMAL HIGH (ref 0.0–0.2)

## 2020-08-02 LAB — HIV ANTIBODY (ROUTINE TESTING W REFLEX): HIV Screen 4th Generation wRfx: NONREACTIVE

## 2020-08-02 LAB — APTT: aPTT: >160 seconds (ref 24–36)

## 2020-08-02 LAB — HEPARIN LEVEL (UNFRACTIONATED)
Heparin Unfractionated: 0.34 IU/mL (ref 0.30–0.70)
Heparin Unfractionated: 0.35 IU/mL (ref 0.30–0.70)

## 2020-08-02 LAB — PROTIME-INR
INR: 1.1 (ref 0.8–1.2)
Prothrombin Time: 13.8 seconds (ref 11.4–15.2)

## 2020-08-02 MED ORDER — DILTIAZEM HCL 30 MG PO TABS
60.0000 mg | ORAL_TABLET | Freq: Three times a day (TID) | ORAL | Status: DC
  Administered 2020-08-02 – 2020-08-03 (×4): 60 mg via ORAL
  Filled 2020-08-02: qty 1
  Filled 2020-08-02 (×2): qty 2
  Filled 2020-08-02: qty 1

## 2020-08-02 MED ORDER — TRAMADOL HCL 50 MG PO TABS
50.0000 mg | ORAL_TABLET | Freq: Two times a day (BID) | ORAL | Status: AC | PRN
  Administered 2020-08-02: 50 mg via ORAL
  Filled 2020-08-02: qty 1

## 2020-08-02 MED ORDER — CARVEDILOL 12.5 MG PO TABS
12.5000 mg | ORAL_TABLET | Freq: Two times a day (BID) | ORAL | Status: DC
  Administered 2020-08-02 – 2020-08-05 (×7): 12.5 mg via ORAL
  Filled 2020-08-02 (×6): qty 1

## 2020-08-02 NOTE — Evaluation (Signed)
Occupational Therapy Evaluation Patient Details Name: Danielle [name] MRN: [id number] DOB: December 12, 1955 Today's Date: 08/02/2020    History of Present Illness Danielle [name] is a 50yoF who [facility] on 10[facility] c SOB beyond her baseline. Pt admitted in COPD exacerbation. PMH: COPD c CRF on 2L O2 chronic, HTN, dCHF, Stg3CKD, ostomy.   Clinical Impression   Patient presenting with decreased I in self care, balance, functional mobility/transfers, endurance, and safety awareness. Pt reports having 3 in 1 commode, cane, and RW but she does not use at home. Patient reports living at home with son, who works during the day, at [employer] I level PTA. Patient currently functioning at min guard- min A overall this session. Pt on 2 L O2 via [place] with HR increasing to 120s and desaturation to 87% with functional tasks.  Patient will benefit from acute OT to increase overall independence in the areas of ADLs, functional mobility, and safety awareness in order to safely discharge home with caregiver. Pt reports her daughter will be coming to stay with her and bringing equipment to home.     Follow Up Recommendations  Home health OT;Supervision - Intermittent    Equipment Recommendations  None recommended by OT    Recommendations for Other Services Other (comment) (none at this time)     Precautions / Restrictions Precautions Precautions: Fall Restrictions Weight Bearing Restrictions: No      Mobility Bed Mobility Overal bed mobility: Needs Assistance Bed Mobility: Supine to Sit;Sit to Supine     Supine to sit: Supervision Sit to supine: Supervision   General bed mobility comments: min cuing for proper technique and safety  Transfers Overall transfer level: Needs assistance Equipment used: 1 person hand held assist Transfers: Sit to/from Stand;Stand Pivot Transfers Sit to Stand: Min guard Stand pivot transfers: Min assist       General transfer comment: additional effort, time; desaturation  standing on room air c congruent DOE; better tolerated when repeated on 2-3L O2    Balance Overall balance assessment: Needs assistance Sitting-balance support: Feet supported Sitting balance-Leahy Scale: Good Sitting balance - Comments: no LOB   Standing balance support: During functional activity Standing balance-Leahy Scale: Fair Standing balance comment: reliance on therapist for balance         ADL either performed or assessed with clinical judgement   ADL Overall ADL's : Needs assistance/impaired Eating/Feeding: Independent   Grooming: Wash/dry hands;Wash/dry face;Oral care;Set up;Sitting   Upper Body Bathing: Set up;Sitting   Lower Body Bathing: Minimal assistance;Sit to/from stand   Upper Body Dressing : Set up;Sitting   Lower Body Dressing: Minimal assistance;Sit to/from stand   Toilet Transfer: Minimal assistance;BSC   Toileting- Clothing Manipulation and Hygiene: Minimal assistance;Sit to/from stand               Vision Baseline Vision/History: Wears glasses Wears Glasses: At all times Patient Visual Report: No change from baseline              Pertinent Vitals/Pain Pain Assessment: No/denies pain     Hand Dominance Right   Extremity/Trunk Assessment Upper Extremity Assessment Upper Extremity Assessment: Generalized weakness   Lower Extremity Assessment Lower Extremity Assessment: Generalized weakness   Cervical / Trunk Assessment Cervical / Trunk Assessment: Normal   Communication Communication Communication: No difficulties   Cognition Arousal/Alertness: Awake/alert Behavior During Therapy: WFL for tasks assessed/performed Overall Cognitive Status: Within Functional Limits for tasks assessed  Home Living Family/patient expects to be discharged to:: Private residence Living Arrangements: Children Available Help at Discharge: Family;Available PRN/intermittently Type of Home: Mobile home Home Access: Stairs  to enter Entrance Stairs-Number of Steps: 4 Entrance Stairs-Rails: Left Home Layout: One level     Bathroom Shower/Tub: Teacher, early years/pre: Standard Bathroom Accessibility: Yes   Home Equipment: Environmental consultant - 2 wheels;Bedside commode;Cane - single point;Shower seat   Additional Comments: O2 as needed at home; states she sponge bathes for safety because she has had difficulty getting up off shower seat since ileostomy.      Prior Functioning/Environment Level of Independence: Independent with assistive device(s)  Gait / Transfers Assistance Needed: No device used in house ADL's / Homemaking Assistance Needed: independent with additional time; soponge bathes   Comments: Pt reports that she has AD for fxl mobility if needed, but does not typically use and she completes all her ADLs I'ly.        OT Problem List: Decreased strength;Decreased safety awareness;Decreased activity tolerance;Decreased knowledge of use of DME or AE;Impaired balance (sitting and/or standing)      OT Treatment/Interventions: Self-care/ADL training;Therapeutic exercise;Therapeutic activities;Energy conservation;Patient/family education;DME and/or AE instruction;Balance training    OT Goals(Current goals can be found in the care plan section) Acute Rehab OT Goals Patient Stated Goal: to get stronger and return home OT Goal Formulation: With patient Time For Goal Achievement: 08/16/20 Potential to Achieve Goals: Good ADL Goals Pt Will Perform Grooming: with modified independence;standing Pt Will Perform Lower Body Dressing: with modified independence;sit to/from stand Pt Will Transfer to Toilet: with modified independence;ambulating Pt Will Perform Toileting - Clothing Manipulation and hygiene: with modified independence;sit to/from stand  OT Frequency: Min 1X/week   Barriers to D/C:    none at this time          AM-PAC OT "6 Clicks" Daily Activity     Outcome Measure Help from another  person eating meals?: None Help from another person taking care of personal grooming?: A Little Help from another person toileting, which includes using toliet, bedpan, or urinal?: A Little Help from another person bathing (including washing, rinsing, drying)?: A Little Help from another person to put on and taking off regular upper body clothing?: None Help from another person to put on and taking off regular lower body clothing?: A Little 6 Click Score: 20   End of Session Nurse Communication: Mobility status  Activity Tolerance: Patient tolerated treatment well Patient left: in bed;with bed alarm set  OT Visit Diagnosis: Muscle weakness (generalized) (M62.81);History of falling (Z91.81);Unsteadiness on feet (R26.81)                Time: 4098-1191 OT Time Calculation (min): 28 min Charges:  OT General Charges $OT Visit: 1 Visit OT Evaluation $OT Eval Low Complexity: 1 Low OT Treatments $Self Care/Home Management : 8-22 mins  [name], MS, OTR/L , CBIS ascom [phone]  08/02/20, 12:45 PM

## 2020-08-02 NOTE — Progress Notes (Addendum)
Progress Note    EVEY MCMAHAN  ZOX:[id number] DOB: 07/12/1956  DOA: 08/01/2020 PCP: [facility] [place]      Brief Narrative:    Medical records reviewed and are as summarized below:  Danielle [name] is a 64 y.o. female with medical history significant for COPD with chronic respiratory failure on 2 L of oxygen as needed, hypertension, chronic diastolic dysfunction CHF, chronic kidney disease stage IIIb, history of GI bleed, remote history of breast cancer.  She presented to the hospital with dry cough and shortness of breath that is worse with exertion.  She was found to have atrial fibrillation with RVR and COPD exacerbation.  Heart rate was in the 150s.  Oxygen saturation was 85% on room air.  She was treated with IV Cardizem infusion and IV heparin drip.  She was also treated with steroids and bronchodilators.  She was seen in consultation by the cardiologist.    Assessment/Plan:   Principal Problem:   COPD with acute exacerbation ([place]) Active Problems:   HTN (hypertension)   Atrial fibrillation (HCC)   Anemia of chronic kidney failure, stage 4 (severe) (HCC)   Depression   Chronic diastolic CHF (congestive heart failure) (HCC)   Atrial fibrillation with rapid ventricular response ([place])   Body mass index is 19.69 kg/m.  PLAN  COPD exacerbation: IV Solu-Medrol has been switched to oral prednisone.  Continue bronchodilators.  Acute on chronic hypoxemic respiratory failure: Continue 2 L/min oxygen via nasal cannula.  Atrial fibrillation with RVR: Heart rate has improved.  IV Cardizem has been changed to oral Cardizem.  Continue oral Cardizem.  Continue heparin drip per cardiologist.  Monitor heparin level per protocol.  Low TSH 0.040: TSH was 0.601 in March 2020.  She may have hyperthyroidism.  Check free T4 and T3.  Chronic diastolic CHF: Compensated.  Torsemide as needed for swelling.  Anemia of chronic disease: H&H is stable.  History of GI  bleed while on Eliquis in the past.   Diet Order            Diet 2 gram sodium Room service appropriate? Yes; Fluid consistency: Thin  Diet effective now                    Consultants:  Cardiologist  Procedures:  None    Medications:   . calcium carbonate  0.5 tablet Oral Daily  . calcium-vitamin D  1 tablet Oral Daily  . carvedilol  12.5 mg Oral BID WC  . citalopram  40 mg Oral Daily  . diltiazem  60 mg Oral Q8H  . feeding supplement (NEPRO CARB STEADY)  237 mL Oral TID BM  . ferrous sulfate  325 mg Oral BID WC  . umeclidinium bromide  1 puff Inhalation Daily   And  . fluticasone furoate-vilanterol  1 puff Inhalation Daily  . methylPREDNISolone (SOLU-MEDROL) injection  40 mg Intravenous Q12H   Followed by  . predniSONE  40 mg Oral Q breakfast  . multivitamin with minerals  1 tablet Oral Daily  . pantoprazole  40 mg Oral BID AC  . sodium bicarbonate  1,300 mg Oral TID  . sucralfate  1 g Oral QID   Continuous Infusions: . heparin 850 Units/hr (08/01/20 1727)     Anti-infectives (From admission, onward)   None             Family Communication/Anticipated D/C date and plan/Code Status   DVT prophylaxis:  Code Status: Full Code  Family Communication: None Disposition Plan:    Status is: Inpatient  Remains inpatient appropriate because:IV treatments appropriate due to intensity of illness or inability to take PO and Inpatient level of care appropriate due to severity of illness   Dispo: The patient is from: Home              Anticipated d/c is to: Home              Anticipated d/c date is: 1 day              Patient currently is not medically stable to d/c.           Subjective:   She complains of shortness of breath with minimal exertion.  She just worked with physical therapist.  [place], physical therapist, was at the bedside.  Objective:    Vitals:   08/02/20 0930 08/02/20 1000 08/02/20 1027 08/02/20 1030  BP:  124/71 (!) 141/97  119/82  Pulse: 69 89  76  Resp: (!) 24 19  19   Temp:      TempSrc:      SpO2: 98% 96% 97% 97%  Weight:      Height:       No data found.  No intake or output data in the 24 hours ending 08/02/20 1119 Filed Weights   07/31/20 1755  Weight: 55.3 kg    Exam:  GEN: NAD SKIN: No rash EYES: EOMI ENT: MMM CV: RRR PULM: Decreased air entry bilaterally.  No wheezing or rales heard. ABD: soft, ND, NT, +BS CNS: AAO x 3, non focal EXT: No edema or tenderness   Data Reviewed:   I have personally reviewed following labs and imaging studies:  Labs: Labs show the following:   Basic Metabolic Panel: Recent Labs  Lab 07/31/20 1812 08/02/20 0351  NA 134* 133*  K 4.5 5.3*  CL 98 100  CO2 25 21*  GLUCOSE 80 154*  BUN 17 32*  CREATININE 1.30* 1.66*  CALCIUM 9.2 9.0   GFR Estimated Creatinine Clearance: 29.1 mL/min (A) (by C-G formula based on SCr of 1.66 mg/dL (H)). Liver Function Tests: No results for input(s): AST, ALT, ALKPHOS, BILITOT, PROT, ALBUMIN in the last 168 hours. No results for input(s): LIPASE, AMYLASE in the last 168 hours. No results for input(s): AMMONIA in the last 168 hours. Coagulation profile Recent Labs  Lab 08/01/20 0130  INR 1.1    CBC: Recent Labs  Lab 07/27/20 1016 07/31/20 1812 08/02/20 0351  WBC  --  7.5 3.6*  HGB 10.9* 9.9* 8.4*  HCT 31.7* 28.6* 24.8*  MCV  --  96.3 97.6  PLT  --  181 182   Cardiac Enzymes: No results for input(s): CKTOTAL, CKMB, CKMBINDEX, TROPONINI in the last 168 hours. BNP (last 3 results) No results for input(s): PROBNP in the last 8760 hours. CBG: No results for input(s): GLUCAP in the last 168 hours. D-Dimer: No results for input(s): DDIMER in the last 72 hours. Hgb A1c: No results for input(s): HGBA1C in the last 72 hours. Lipid Profile: No results for input(s): CHOL, HDL, LDLCALC, TRIG, CHOLHDL, LDLDIRECT in the last 72 hours. Thyroid function studies: Recent Labs     08/01/20 1025  TSH 0.040*   Anemia work up: No results for input(s): VITAMINB12, FOLATE, FERRITIN, TIBC, IRON, RETICCTPCT in the last 72 hours. Sepsis Labs: Recent Labs  Lab 07/31/20 1812 08/02/20 0351  PROCALCITON <0.10  --   WBC 7.5  3.6*  LATICACIDVEN 0.9  --     Microbiology Recent Results (from the past 240 hour(s))  Respiratory Panel by RT PCR (Flu A&B, Covid) - Nasopharyngeal Swab     Status: None   Collection Time: 08/01/20 10:25 AM   Specimen: Nasopharyngeal Swab  Result Value Ref Range Status   SARS Coronavirus 2 by RT PCR NEGATIVE NEGATIVE Final    Comment: (NOTE) SARS-CoV-2 target nucleic acids are NOT DETECTED.  The SARS-CoV-2 RNA is generally detectable in upper respiratoy specimens during the acute phase of infection. The lowest concentration of SARS-CoV-2 viral copies this assay can detect is 131 copies/mL. A negative result does not preclude SARS-Cov-2 infection and should not be used as the sole basis for treatment or other patient management decisions. A negative result may occur with  improper specimen collection/handling, submission of specimen other than nasopharyngeal swab, presence of viral mutation(s) within the areas targeted by this assay, and inadequate number of viral copies (<131 copies/mL). A negative result must be combined with clinical observations, patient history, and epidemiological information. The expected result is Negative.  Fact Sheet for Patients:  PinkCheek.be  Fact Sheet for Healthcare Providers:  GravelBags.it  This test is no t yet approved or cleared by the [place] FDA and  has been authorized for detection and/or diagnosis of SARS-CoV-2 by FDA under an Emergency Use Authorization (EUA). This EUA will remain  in effect (meaning this test can be used) for the duration of the COVID-19 declaration under Section 564(b)(1) of the Act, 21 U.S.C. section  360bbb-3(b)(1), unless the authorization is terminated or revoked sooner.     Influenza A by PCR NEGATIVE NEGATIVE Final   Influenza B by PCR NEGATIVE NEGATIVE Final    Comment: (NOTE) The Xpert Xpress SARS-CoV-2/FLU/RSV assay is intended as an aid in  the diagnosis of influenza from Nasopharyngeal swab specimens and  should not be used as a sole basis for treatment. Nasal washings and  aspirates are unacceptable for Xpert Xpress SARS-CoV-2/FLU/RSV  testing.  Fact Sheet for Patients: PinkCheek.be  Fact Sheet for Healthcare Providers: GravelBags.it  This test is not yet approved or cleared by the [place] FDA and  has been authorized for detection and/or diagnosis of SARS-CoV-2 by  FDA under an Emergency Use Authorization (EUA). This EUA will remain  in effect (meaning this test can be used) for the duration of the  Covid-19 declaration under Section 564(b)(1) of the Act, 21  U.S.C. section 360bbb-3(b)(1), unless the authorization is  terminated or revoked. Performed at [facility], [address] [place], [place] [address]     Procedures and diagnostic studies:  DG Chest 2 View  Result Date: 07/31/2020 CLINICAL DATA:  Shortness of breath EXAM: CHEST - 2 VIEW COMPARISON:  None FINDINGS: Mild bibasilar opacities with small right pleural effusion. Unchanged right hilar prominence. No focal airspace consolidation. IMPRESSION: Small right pleural effusion with basilar atelectasis. Electronically Signed   By: Ulyses Jarred M.D.   On: 07/31/2020 19:12               LOS: 1 day   Mckyle Solanki  [facility]   Pager on [email or web address]. If 7PM-7AM, please contact night-coverage at [email or web address]     08/02/2020, 11:19 AM

## 2020-08-02 NOTE — Progress Notes (Signed)
ANTICOAGULATION CONSULT NOTE - Initial Consult  Pharmacy Consult for Heparin infusion Indication: atrial fibrillation  No Known Allergies  Patient Measurements: Height: 5\' 6"  (167.6 cm) Weight: 55.3 kg (122 lb) IBW/kg (Calculated) : 59.3 Heparin Dosing Weight: 55.3 kg  Vital Signs: BP: 123/79 (10/19 0430) Pulse Rate: 82 (10/19 0430)  Labs: Recent Labs    07/31/20 1812 08/01/20 0130 08/01/20 1025 08/02/20 0230 08/02/20 0351  HGB 9.9*  --   --   --  8.4*  HCT 28.6*  --   --   --  24.8*  PLT 181  --   --   --  182  APTT  --  >160*  --   --   --   LABPROT  --  13.8  --   --   --   INR  --  1.1  --   --   --   HEPARINUNFRC  --   --   --  0.34  --   CREATININE 1.30*  --   --   --  1.66*  TROPONINIHS 23*  --  18*  --   --     Estimated Creatinine Clearance: 29.1 mL/min (A) (by C-G formula based on SCr of 1.66 mg/dL (H)).   Medical History: Past Medical History:  Diagnosis Date  . Anxiety   . Arthritis   . Breast cancer ([place]) 2000  . Cancer ([place]) left    breast cancer 2000, chemo tx's with total mastectomy and lymph nodes resected.   . Cancer of right lung ([place]) 02/21/2016   rad tx's.   . CHF (congestive heart failure) ([place])   . COPD (chronic obstructive pulmonary disease) ([place])   . Dependence on supplemental oxygen   . Depression   . Heart murmur   . Hypertension   . Lung nodule   . Lymphedema   . Personal history of chemotherapy   . Personal history of radiation therapy   . Shortness of breath dyspnea    with exertion  . Status post chemotherapy 2001   left breast cancer  . Status post radiation therapy 2001   left breast cancer    Medications:  Confirmed with patient that she was not on any recent anticoagulation prior to admission (pt stated she has not had Eliquis in over a year)  Assessment: 64yo female with PMH for COPD with chronic respiratory failure on 2 L of oxygen as needed, hypertension, chronic diastolic dysfunction CHF and stage III chronic  kidney disease, anemia. In the ED, patient developed afib with RVR - started on diltiazem drip. Patient reports history of GI bleed with blood in her colostomy bag while on Eliquis ~2 years ago. Patient does not recollect any prior history of Afib. Pharmacy has been consulted for heparin dosing and monitoring.  Baseline labs Hgb 9.9/ Hct 28.6  - seems to be her baseline Plt 181 aPTT and PT-INR pending  Goal of Therapy:  Heparin level 0.3-0.7 units/ml Monitor platelets by anticoagulation protocol: Yes   Plan:  Give 2800 units bolus x 1 Start heparin infusion at 850 units/hr Check anti-Xa level in 6 hours and daily while on heparin Continue to monitor H&H and platelets  Cardio deferring chronic anticoagulation in light of history of GIB on Eliquis and ongoing anemia; long term solution such as Watchman device, per Dr. [name], as outpatient.  10/19:  HL @ 0230 = 0.34 Will continue pt on current rate and recheck HL in 8 hrs.   Kaytelyn Glore D, PharmD 08/02/2020  5:04 AM

## 2020-08-02 NOTE — Progress Notes (Signed)
[facility] Cardiology  SUBJECTIVE: Patient laying in bed, reports shortness of breath with ambulation   Vitals:   08/02/20 0530 08/02/20 0600 08/02/20 0630 08/02/20 0723  BP: (!) 123/92 125/73 111/69 131/70  Pulse: 70 69 63 76  Resp: 17 (!) 23 17 18   Temp:      TempSrc:      SpO2: 99% 96% 99% 96%  Weight:      Height:        No intake or output data in the 24 hours ending 08/02/20 0737    PHYSICAL EXAM  General: Well developed, well nourished, in no acute distress HEENT:  Normocephalic and atramatic Neck:  No JVD.  Lungs: Clear bilaterally to auscultation and percussion. Heart: HRRR . Normal S1 and S2 without gallops or murmurs.  Abdomen: Bowel sounds are positive, abdomen soft and non-tender  Msk:  Back normal, normal gait. Normal strength and tone for age. Extremities: No clubbing, cyanosis or edema.   Neuro: Alert and oriented X 3. Psych:  Good affect, responds appropriately   LABS: Basic Metabolic Panel: Recent Labs    07/31/20 1812 08/02/20 0351  NA 134* 133*  K 4.5 5.3*  CL 98 100  CO2 25 21*  GLUCOSE 80 154*  BUN 17 32*  CREATININE 1.30* 1.66*  CALCIUM 9.2 9.0   Liver Function Tests: No results for input(s): AST, ALT, ALKPHOS, BILITOT, PROT, ALBUMIN in the last 72 hours. No results for input(s): LIPASE, AMYLASE in the last 72 hours. CBC: Recent Labs    07/31/20 1812 08/02/20 0351  WBC 7.5 3.6*  HGB 9.9* 8.4*  HCT 28.6* 24.8*  MCV 96.3 97.6  PLT 181 182   Cardiac Enzymes: No results for input(s): CKTOTAL, CKMB, CKMBINDEX, TROPONINI in the last 72 hours. BNP: Invalid input(s): POCBNP D-Dimer: No results for input(s): DDIMER in the last 72 hours. Hemoglobin A1C: No results for input(s): HGBA1C in the last 72 hours. Fasting Lipid Panel: No results for input(s): CHOL, HDL, LDLCALC, TRIG, CHOLHDL, LDLDIRECT in the last 72 hours. Thyroid Function Tests: Recent Labs    08/01/20 1025  TSH 0.040*   Anemia Panel: No results for input(s):  VITAMINB12, FOLATE, FERRITIN, TIBC, IRON, RETICCTPCT in the last 72 hours.  DG Chest 2 View  Result Date: 07/31/2020 CLINICAL DATA:  Shortness of breath EXAM: CHEST - 2 VIEW COMPARISON:  None FINDINGS: Mild bibasilar opacities with small right pleural effusion. Unchanged right hilar prominence. No focal airspace consolidation. IMPRESSION: Small right pleural effusion with basilar atelectasis. Electronically Signed   By: Ulyses Jarred M.D.   On: 07/31/2020 19:12     Echo pending  TELEMETRY: Atrial fibrillation at a rate of 110 bpm:  ASSESSMENT AND PLAN:  Principal Problem:   COPD with acute exacerbation ([place]) Active Problems:   HTN (hypertension)   Atrial fibrillation (HCC)   Anemia of chronic kidney failure, stage 4 (severe) (HCC)   Depression   Chronic diastolic CHF (congestive heart failure) (HCC)   Atrial fibrillation with rapid ventricular response ([place])    1.  Atrial fibrillation with rapid ventricular rate, in the setting of COPD exacerbation, modestly improved after starting diltiazem infusion. 2.  COPD exacerbation 3.  History of GI bleed, while on Eliquis, though unclear  indication for Eliquis, patient aware of history of atrial fibrillation 4.  Anemia, hemoglobin and hematocrit 8.4 and 24.8, respectively 81  Recommendations  1.  Agree with current therapy 2.  Transition diltiazem drip to p.o. diltiazem 3.  Uptitrate carvedilol 12.5 mg twice  daily 4.  Continue heparin drip for now though may need discontinue experiences worsening anemia 5.  Long-term solution for stroke prevention such as watchman device (left atrial occluder) per Dr. [name] as outpatient 5.  Review 2D echocardiogram    Isaias Cowman, MD, PhD, [facility] 08/02/2020 7:37 AM

## 2020-08-02 NOTE — Progress Notes (Signed)
Patient admitted to unit at this time via bed accompanied with ED nurse and belongings. Patient A/Ox4 upon admitting, able to verbalize needs. Controlled Afib on tele. VSS. 2L of oxygen in place and purewick/colostomy bag remains in place and intact. Patient oriented to room and bed/remote accessories. Will continue to monitor.

## 2020-08-02 NOTE — ED Notes (Signed)
Per lab, heparin level needs recollect, patient hard stick, phlebotomy to obtain

## 2020-08-02 NOTE — Evaluation (Signed)
Physical Therapy Evaluation Patient Details Name: Danielle [name] MRN: [id number] DOB: 1956/01/27 Today's Date: 08/02/2020   History of Present Illness  Danielle [name] is a 48yoF who [facility] c SOB beyond her baseline. Pt admitted in COPD exacerbation. PMH: COPD c CRF on 2L O2 chronic, HTN, dCHF, Stg3CKD, ostomy.  Clinical Impression  Pt admitted with above diagnosis. Pt currently with functional limitations due to the deficits listed below (see "PT Problem List"). Upon entry, pt in bed, awake and agreeable to participate. The pt is alert and oriented x4, pleasant, conversational, and generally a good historian. Pt continues to have easily provoked DOE and gets weak in legs when up AMB, per recent mobility with OT in room. Pt performs bed mobility, transfers without device, with additional effort and time required, easily provoked DOE. In standing, DOE rapidly worsens as SpO2 drops to 87%, but on 3L/min pt tolerates standing for 2-3 minutes. BP assessment shows mild orthostatic hypotension in standing, pt without symptoms. Prolonged standing 2-3 minutes (c O2) results in aggravation of AF c RVR as well. Pt struggles with basic marching in place, appears weak and unsteady, but breathing is made worse- additional AMB deferred due to RN giving meds to address current issues in session. Functional mobility assessment demonstrates increased effort/time requirements, poor tolerance, and need for physical assistance, whereas the patient performed these at a higher level of independence PTA. Pt will benefit from skilled PT intervention to increase independence and safety with basic mobility in preparation for discharge to the venue listed below.       Follow Up Recommendations Home health PT;Supervision - Intermittent    Equipment Recommendations  None recommended by PT    Recommendations for Other Services       Precautions / Restrictions Precautions Precautions:  Fall Restrictions Weight Bearing Restrictions: No      Mobility  Bed Mobility Overal bed mobility: Modified Independent                Transfers Overall transfer level: Modified independent Equipment used: None             General transfer comment: additional effort, time; desaturation standing on room air c congruent DOE; better tolerated when repeated on 2-3L O2  Ambulation/Gait Ambulation/Gait assistance:  (trial of weight shifting at bedside, but largely greater DOE.) Gait Distance (Feet):  (additional AMB defered, as RN has arrived to given meds)            Stairs            Wheelchair Mobility    Modified Rankin (Stroke Patients Only)       Balance Overall balance assessment: Modified Independent                                           Pertinent Vitals/Pain Pain Assessment: No/denies pain    Home Living Family/patient expects to be discharged to:: Private residence Living Arrangements: Children (SOn lives, worksk during day) Available Help at Discharge: Family;Available PRN/intermittently (DTR to stay with pt at DC) Type of Home: Mobile home Home Access: Stairs to enter Entrance Stairs-Rails: Right (Rt going up) Entrance Stairs-Number of Steps: 4 Home Layout: One level Home Equipment: Walker - 2 wheels;Bedside commode;Cane - single point;Shower seat Additional Comments: O2 as needed at home; states she sponge bathes for safety because she has had difficulty getting up off  shower seat since ileostomy.    Prior Function Level of Independence: Independent with assistive device(s)   Gait / Transfers Assistance Needed: No device used in house  ADL's / Homemaking Assistance Needed: independent with additional time; soponge bathes  Comments: Pt reports that she has AD for fxl mobility if needed, but does not typically use and she completes all her ADLs I'ly.     Hand Dominance   Dominant Hand: Right     Extremity/Trunk Assessment   Upper Extremity Assessment Upper Extremity Assessment: Generalized weakness;Overall [facility] for tasks assessed    Lower Extremity Assessment Lower Extremity Assessment: Generalized weakness;Overall [facility] for tasks assessed    Cervical / Trunk Assessment Cervical / Trunk Assessment: Normal  Communication   Communication: No difficulties  Cognition Arousal/Alertness: Awake/alert Behavior During Therapy: WFL for tasks assessed/performed Overall Cognitive Status: Within Functional Limits for tasks assessed                                        General Comments      Exercises     Assessment/Plan    PT Assessment Patient needs continued PT services  PT Problem List Decreased strength;Decreased activity tolerance;Decreased balance;Decreased mobility;Cardiopulmonary status limiting activity       PT Treatment Interventions Balance training;DME instruction;Gait training;Stair training;Functional mobility training;Therapeutic activities;Therapeutic exercise    PT Goals (Current goals can be found in the Care Plan section)  Acute Rehab PT Goals Patient Stated Goal: have less DOE, closer to baseline PT Goal Formulation: With patient Time For Goal Achievement: 08/16/20 Potential to Achieve Goals: Good    Frequency Min 2X/week   Barriers to discharge        Co-evaluation               AM-PAC PT "6 Clicks" Mobility  Outcome Measure Help needed turning from your back to your side while in a flat bed without using bedrails?: None Help needed moving from lying on your back to sitting on the side of a flat bed without using bedrails?: None Help needed moving to and from a bed to a chair (including a wheelchair)?: None Help needed standing up from a chair using your arms (e.g., wheelchair or bedside chair)?: None Help needed to walk in hospital room?: A Little Help needed climbing 3-5 steps with a railing? : A Little 6 Click Score:  22    End of Session Equipment Utilized During Treatment: Oxygen Activity Tolerance: Patient tolerated treatment well;Patient limited by fatigue Patient left: in bed;with nursing/sitter in room Nurse Communication: Mobility status (some assymptomatic orthostatic vitals) PT Visit Diagnosis: Unsteadiness on feet (R26.81);Difficulty in walking, not elsewhere classified (R26.2);Other abnormalities of gait and mobility (R26.89)    Time: 9628-3662 PT Time Calculation (min) (ACUTE ONLY): 26 min   Charges:   PT Evaluation $PT Eval Moderate Complexity: 1 Mod          11:21 AM, 08/02/20 Etta Grandchild, PT, DPT Physical Therapist - [facility]  [phone] ([place])    Bilaal Leib C 08/02/2020, 11:18 AM

## 2020-08-02 NOTE — ED Notes (Signed)
Pt's ostomy bag and abdominal dressing changed by this RN.

## 2020-08-02 NOTE — Plan of Care (Signed)
  Problem: Coping: Goal: Coping ability will improve Outcome: Progressing   Problem: Fluid Volume: Goal: Ability to achieve a balanced intake and output will improve Outcome: Progressing   Problem: Education: Goal: Ability to demonstrate management of disease process will improve Outcome: Progressing Goal: Ability to verbalize understanding of medication therapies will improve Outcome: Progressing Goal: Individualized Educational Video(s) Outcome: Progressing   Problem: Activity: Goal: Capacity to carry out activities will improve Outcome: Progressing   Problem: Cardiac: Goal: Ability to achieve and maintain adequate cardiopulmonary perfusion will improve Outcome: Progressing

## 2020-08-02 NOTE — ED Notes (Signed)
Pt brief changed at this time by this RN and purwick placed back on patient. Pt clean at this time, no further needs noted.

## 2020-08-02 NOTE — Progress Notes (Signed)
ANTICOAGULATION CONSULT NOTE - Initial Consult  Pharmacy Consult for Heparin infusion Indication: atrial fibrillation  No Known Allergies  Patient Measurements: Height: 5\' 6"  (167.6 cm) Weight: 55.3 kg (122 lb) IBW/kg (Calculated) : 59.3 Heparin Dosing Weight: 55.3 kg  Vital Signs: BP: 106/92 (10/19 1300) Pulse Rate: 62 (10/19 1300)  Labs: Recent Labs    07/31/20 1812 08/01/20 0130 08/01/20 1025 08/02/20 0230 08/02/20 0351 08/02/20 1305  HGB 9.9*  --   --   --  8.4*  --   HCT 28.6*  --   --   --  24.8*  --   PLT 181  --   --   --  182  --   APTT  --  >160*  --   --   --   --   LABPROT  --  13.8  --   --   --   --   INR  --  1.1  --   --   --   --   HEPARINUNFRC  --   --   --  0.34  --  0.35  CREATININE 1.30*  --   --   --  1.66*  --   TROPONINIHS 23*  --  18*  --   --   --     Estimated Creatinine Clearance: 29.1 mL/min (A) (by C-G formula based on SCr of 1.66 mg/dL (H)).   Medical History: Past Medical History:  Diagnosis Date  . Anxiety   . Arthritis   . Breast cancer ([place]) 2000  . Cancer ([place]) left    breast cancer 2000, chemo tx's with total mastectomy and lymph nodes resected.   . Cancer of right lung ([place]) 02/21/2016   rad tx's.   . CHF (congestive heart failure) ([place])   . COPD (chronic obstructive pulmonary disease) ([place])   . Dependence on supplemental oxygen   . Depression   . Heart murmur   . Hypertension   . Lung nodule   . Lymphedema   . Personal history of chemotherapy   . Personal history of radiation therapy   . Shortness of breath dyspnea    with exertion  . Status post chemotherapy 2001   left breast cancer  . Status post radiation therapy 2001   left breast cancer    Medications:  Confirmed with patient that she was not on any recent anticoagulation prior to admission (pt stated she has not had Eliquis in over a year)  Assessment: 64yo female with PMH for COPD with chronic respiratory failure on 2 L of oxygen as needed,  hypertension, chronic diastolic dysfunction CHF and stage III chronic kidney disease, anemia. In the ED, patient developed afib with RVR - started on diltiazem drip. Patient reports history of GI bleed with blood in her colostomy bag while on Eliquis ~2 years ago. Patient does not recollect any prior history of Afib. Pharmacy has been consulted for heparin dosing and monitoring.  Heparin infusion started at 850 units/h on 10/18 10/19:  HL @ 0230 = 0.34 (level therapeutic x1)  Goal of Therapy:  Heparin level 0.3-0.7 units/ml Monitor platelets by anticoagulation protocol: Yes   Plan:  10/19 @ 1305: HL 0.35. Level is now therapeutic x2. PLT stable, Hgb 9.9 >> 8.4. Will continue current infusion rate of 850 units/h Will recheck HL and CBC daily per protocol  Cardio deferring chronic anticoagulation in light of history of GIB on Eliquis and ongoing anemia; long term solution such as Watchman device, per Dr.  [name], as outpatient.   Dara Hoyer, PharmD Candidate 08/02/2020 1:32 PM

## 2020-08-02 NOTE — ED Notes (Signed)
Pharmacy called for heparin level, lab has not received blue top sent at 1100, lab called, found tube and now received.

## 2020-08-03 DIAGNOSIS — I10 Essential (primary) hypertension: Secondary | ICD-10-CM

## 2020-08-03 DIAGNOSIS — F32A Depression, unspecified: Secondary | ICD-10-CM

## 2020-08-03 LAB — CBC
HCT: 26.8 % — ABNORMAL LOW (ref 36.0–46.0)
Hemoglobin: 9.2 g/dL — ABNORMAL LOW (ref 12.0–15.0)
MCH: 33.5 pg (ref 26.0–34.0)
MCHC: 34.3 g/dL (ref 30.0–36.0)
MCV: 97.5 fL (ref 80.0–100.0)
Platelets: 179 10*3/uL (ref 150–400)
RBC: 2.75 MIL/uL — ABNORMAL LOW (ref 3.87–5.11)
RDW: 14.9 % (ref 11.5–15.5)
WBC: 7.2 10*3/uL (ref 4.0–10.5)
nRBC: 2.2 % — ABNORMAL HIGH (ref 0.0–0.2)

## 2020-08-03 LAB — HEPARIN LEVEL (UNFRACTIONATED): Heparin Unfractionated: 0.66 IU/mL (ref 0.30–0.70)

## 2020-08-03 MED ORDER — HEPARIN SODIUM (PORCINE) 5000 UNIT/ML IJ SOLN
5000.0000 [IU] | Freq: Three times a day (TID) | INTRAMUSCULAR | Status: DC
  Administered 2020-08-03 – 2020-08-05 (×6): 5000 [IU] via SUBCUTANEOUS
  Filled 2020-08-03 (×6): qty 1

## 2020-08-03 MED ORDER — DILTIAZEM HCL ER COATED BEADS 180 MG PO CP24
180.0000 mg | ORAL_CAPSULE | Freq: Every day | ORAL | Status: DC
  Administered 2020-08-03 – 2020-08-05 (×3): 180 mg via ORAL
  Filled 2020-08-03 (×3): qty 1

## 2020-08-03 NOTE — Progress Notes (Signed)
Initial Nutrition Assessment  DOCUMENTATION CODES:   Severe malnutrition in context of chronic illness  INTERVENTION:   Nepro Shake po TID, each supplement provides 425 kcal and 19 grams protein  Magic cup TID with meals, each supplement provides 290 kcal and 9 grams of protein  MVI daily  Liberalize diet   NUTRITION DIAGNOSIS:   Severe Malnutrition related to chronic illness (COPD, breast and lung cancer) as evidenced by moderate fat depletion, severe fat depletion, severe muscle depletion.  GOAL:   Patient will meet greater than or equal to 90% of their needs  MONITOR:   Supplement acceptance, PO intake, Labs, Weight trends, Skin, I & O's  REASON FOR ASSESSMENT:   Consult Assessment of nutrition requirement/status  ASSESSMENT:   64 y.o. female with medical history significant of hypertension, COPD on as needed oxygen at home, GERD, depression, left breast cancer (s/p of chemotherapy, radiation therapy and mastectomy), dCHF, right lung cancer (s/p radiation therapy), anemia, former smoker, CKD stage IV, s/p of ileostomy and COVID-19 infection 09/2019 who is admitted with COPD exacerbation and Afib with RVR   Met with pt in room today. Pt is well known to this RD from multiple previous admits. Pt was hospitalized during multiple admissions from July-November of 2019 with radiation pneumonitis and SBO where she required prolonged TPN that was complicated by CHF. Pt developed malnutrition during that admit where she lost down from ~135lbs to ~119lbs. Pt has regained back some of her weight; her UBW is ~122-123lbs. Pt's admit weight is currently 16lbs higher than her UBW; RD unsure if admit weight is correct. Pt reports poor appetite and oral intake at baseline. A typical day for her might include an Ensure for breakfast, a sandwich for lunch and something more substantial that she prepares for dinner. Pt reports that sometimes, she only eats one meal per day and that she will try  to drink 2 Ensures on those days. Pt currently eating only sips and bites of meals in the hospital. Pt had 2 Nepro supplements on her side table today which she reports that she is planning to drink later. RD will liberalize pt's diet to try and increase her oral intake; pt is not eating enough to exceed any nutrient limits.   Medications reviewed and include: tums, oscal w/ D, celexa, ferrous sulfate, MVI, protonix, prednisone, Na bicarbonate, carafate  Labs reviewed: Na 133(L), K 5.3(H), BUN 32(H), creat 1.66(H)- 10/19 BNP 849.5(H)- 10/18 Hgb 9.2(L), Hct 26.8(L)  NUTRITION - FOCUSED PHYSICAL EXAM:    Most Recent Value  Orbital Region  Moderate depletion  Upper Arm Region  Severe depletion  Thoracic and Lumbar Region  Moderate depletion  Buccal Region  Moderate depletion  Temple Region  Moderate depletion  Clavicle Bone Region  Severe depletion  Clavicle and Acromion Bone Region  Severe depletion  Scapular Bone Region  Moderate depletion  Dorsal Hand  Severe depletion  Patellar Region  Severe depletion  Anterior Thigh Region  Severe depletion  Posterior Calf Region  Severe depletion  Edema (RD Assessment)  none  Hair  Reviewed  Eyes  Reviewed  Mouth  Reviewed  Skin  Reviewed  Nails  Reviewed     Diet Order:   Diet Order            Diet regular Room service appropriate? Yes; Fluid consistency: Thin  Diet effective now                EDUCATION NEEDS:   Education needs have been  addressed  Skin:  Skin Assessment: Reviewed RN Assessment (small abdomen wound at previous incision site, chronic)  Last BM:  10/20- small amount via ostomy  Height:   Ht Readings from Last 1 Encounters:  07/31/20 $RemoveB'5\' 6"'qjLIyCzy$  (1.676 m)    Weight:   Wt Readings from Last 1 Encounters:  08/03/20 62.6 kg    Ideal Body Weight:  59 kg  BMI:  Body mass index is 22.26 kg/m.  Estimated Nutritional Needs:   Kcal:  1600-1800kcal/day  Protein:  80-90g/day  Fluid:  1.4-1.7/day  [name] MS, RD, LDN Please refer to [facility] for RD and/or RD on-call/weekend/after hours pager

## 2020-08-03 NOTE — Progress Notes (Signed)
ANTICOAGULATION CONSULT NOTE - Initial Consult  Pharmacy Consult for Heparin infusion Indication: atrial fibrillation  No Known Allergies  Patient Measurements: Height: 5\' 6"  (167.6 cm) Weight: 62.6 kg (137 lb 14.4 oz) IBW/kg (Calculated) : 59.3 Heparin Dosing Weight: 55.3 kg  Vital Signs: Temp: 98.1 F (36.7 C) (10/20 0413) Temp Source: Oral (10/20 0413) BP: 129/85 (10/20 0531) Pulse Rate: 104 (10/20 0413)  Labs: Recent Labs    07/31/20 1812 07/31/20 1812 08/01/20 0130 08/01/20 1025 08/02/20 0230 08/02/20 0351 08/02/20 1305 08/03/20 0537  HGB 9.9*   < >  --   --   --  8.4*  --  9.2*  HCT 28.6*  --   --   --   --  24.8*  --  26.8*  PLT 181  --   --   --   --  182  --  179  APTT  --   --  >160*  --   --   --   --   --   LABPROT  --   --  13.8  --   --   --   --   --   INR  --   --  1.1  --   --   --   --   --   HEPARINUNFRC  --   --   --   --  0.34  --  0.35 0.66  CREATININE 1.30*  --   --   --   --  1.66*  --   --   TROPONINIHS 23*  --   --  18*  --   --   --   --    < > = values in this interval not displayed.    Estimated Creatinine Clearance: 31.2 mL/min (A) (by C-G formula based on SCr of 1.66 mg/dL (H)).   Medical History: Past Medical History:  Diagnosis Date  . Anxiety   . Arthritis   . Breast cancer ([place]) 2000  . Cancer ([place]) left    breast cancer 2000, chemo tx's with total mastectomy and lymph nodes resected.   . Cancer of right lung ([place]) 02/21/2016   rad tx's.   . CHF (congestive heart failure) ([place])   . COPD (chronic obstructive pulmonary disease) ([place])   . Dependence on supplemental oxygen   . Depression   . Heart murmur   . Hypertension   . Lung nodule   . Lymphedema   . Personal history of chemotherapy   . Personal history of radiation therapy   . Shortness of breath dyspnea    with exertion  . Status post chemotherapy 2001   left breast cancer  . Status post radiation therapy 2001   left breast cancer    Medications:  Confirmed  with patient that she was not on any recent anticoagulation prior to admission (pt stated she has not had Eliquis in over a year)  Assessment: 64yo female with PMH for COPD with chronic respiratory failure on 2 L of oxygen as needed, hypertension, chronic diastolic dysfunction CHF and stage III chronic kidney disease, anemia. In the ED, patient developed afib with RVR - started on diltiazem drip. Patient reports history of GI bleed with blood in her colostomy bag while on Eliquis ~2 years ago. Patient does not recollect any prior history of Afib. Pharmacy has been consulted for heparin dosing and monitoring.  Baseline labs Hgb 9.9/ Hct 28.6  - seems to be her baseline Plt 181 aPTT and PT-INR  pending  Goal of Therapy:  Heparin level 0.3-0.7 units/ml Monitor platelets by anticoagulation protocol: Yes   Plan:  Give 2800 units bolus x 1 Start heparin infusion at 850 units/hr Check anti-Xa level in 6 hours and daily while on heparin Continue to monitor H&H and platelets  Cardio deferring chronic anticoagulation in light of history of GIB on Eliquis and ongoing anemia; long term solution such as Watchman device, per Dr. [name], as outpatient.  10/19:  HL @ 0230 = 0.34 Will continue pt on current rate and recheck HL in 8 hrs.   10/20:  HL @ 1025 = 0.66 Will continue pt on current rate and recheck HL on 10/21 with AM labs.   Blanca Thornton D, PharmD 08/03/2020 6:41 AM

## 2020-08-03 NOTE — Plan of Care (Signed)
  Problem: Coping: Goal: Coping ability will improve Outcome: Progressing   Problem: Fluid Volume: Goal: Ability to achieve a balanced intake and output will improve Outcome: Progressing   Problem: Education: Goal: Ability to demonstrate management of disease process will improve Outcome: Progressing Goal: Ability to verbalize understanding of medication therapies will improve Outcome: Progressing Goal: Individualized Educational Video(s) Outcome: Progressing   Problem: Activity: Goal: Capacity to carry out activities will improve Outcome: Progressing   Problem: Cardiac: Goal: Ability to achieve and maintain adequate cardiopulmonary perfusion will improve Outcome: Progressing

## 2020-08-03 NOTE — Progress Notes (Signed)
PROGRESS NOTE  Danielle [name] XWR:[id number] DOB: [birth year] DOA: 08/01/2020 PCP: [facility] [place]   LOS: 2 days   Brief narrative: As per HPI,  Danielle [name] is a 64 y.o. female with medical history significant forCOPD with chronic respiratory failure on 2 L of oxygen as needed, hypertension, chronic diastolic dysfunction CHF, chronic kidney disease stage IIIb, history of GI bleed, remote history of breast cancer presented to the hospital with dry cough and shortness of breath that was worse with exertion.She was found to have atrial fibrillation with RVR and COPD exacerbation.  Heart rate was in the 150s.  Oxygen saturation was 85% on room air.  She was treated with IV Cardizem infusion and IV heparin drip.  She was also treated with steroids and bronchodilators.  She was seen in consultation by the cardiologist.  Assessment/Plan:  Principal Problem:   COPD with acute exacerbation ([place]) Active Problems:   HTN (hypertension)   Atrial fibrillation (HCC)   Anemia of chronic kidney failure, stage 4 (severe) (HCC)   Depression   Chronic diastolic CHF (congestive heart failure) (HCC)   Atrial fibrillation with rapid ventricular response (HCC)   Acute COPD exacerbation: Continue prednisone, bronchodilators, chronic oxygen supplementation.  Patient has dyspnea on exertion and is very symptomatic at this time.  Uses oxygen at home as needed.  Acute on chronic hypoxemic respiratory failure: Continue 2 L/min oxygen via nasal cannula.  Patient uses 2 L of nasal cannula at home as needed.  Continue treatment for COPD and A. fib.  Atrial fibrillation with RVR: Currently on oral Cardizem and Coreg.  Cardiology on board.  Was on heparin drip.  Has converted to normal sinus rhythm.  Heparin drip has been discontinued by cardiology.  Cardiology thinking about watchman device for long-term.  On oral torsemide.  Low TSH 0.040: TSH was 0.601 in March 2020.  She may have subclinical  hyperthyroidism.  Free T4 at 0.75.  Will need outpatient monitoring in 6 to 8 weeks.  Anemia of chronic disease: H&H is stable.  Hemoglobin of 9.2 today from 8.4.  History of GI bleed while on Eliquis in the past.  Been on IV heparin for that reason while in the hospital to monitor hemoglobin.  DVT prophylaxis: Heparin has been discontinued.  Code Status: Full code  Family Communication: None  Status is: Inpatient  Remains inpatient appropriate because:IV treatments appropriate due to intensity of illness or inability to take PO, Inpatient level of care appropriate due to severity of illness and Cardiology follow-up, ongoing dyspnea   Dispo: The patient is from: Home              Anticipated d/c is to: Home              Anticipated d/c date is: 2 days, still very symptomatic and dyspneic with              Patient currently is not medically stable to d/c.  Consultants:  Cardiology  Procedures:  None  Antibiotics:  . None  Anti-infectives (From admission, onward)   None     Subjective: Today, patient was seen and examined at bedside.  Complains of dyspnea on minimal exertion and orthopnea.  Mild chest discomfort on lying down.  Objective: Vitals:   08/03/20 0413 08/03/20 0531  BP: 127/85 129/85  Pulse: (!) 104   Resp: 16   Temp: 98.1 F (36.7 C)   SpO2: 98%     Intake/Output Summary (Last 24 hours) at  08/03/2020 0821 Last data filed at 08/03/2020 0437 Gross per 24 hour  Intake 350 ml  Output 300 ml  Net 50 ml   Filed Weights   07/31/20 1755 08/03/20 0413  Weight: 55.3 kg 62.6 kg   Body mass index is 22.26 kg/m.   Physical Exam: GENERAL: Patient is alert awake and oriented. Not in obvious distress.  On nasal cannula oxygen. HENT: No scleral pallor or icterus. Pupils equally reactive to light. Oral mucosa is moist NECK: is supple, no gross swelling noted. CHEST: Breath sounds bilaterally diminished..  Coarse breath sounds noted.  No crackles or  wheezes.  CVS: S1 and S2 heard, no murmur.  Regular rhythm to the ABDOMEN: Soft, non-tender, bowel sounds are present. EXTREMITIES: No edema. CNS: Cranial nerves are intact. No focal motor deficits. SKIN: warm and dry without rashes.  Data Review: I have personally reviewed the following laboratory data and studies,  CBC: Recent Labs  Lab 07/27/20 1016 07/31/20 1812 08/02/20 0351 08/03/20 0537  WBC  --  7.5 3.6* 7.2  HGB 10.9* 9.9* 8.4* 9.2*  HCT 31.7* 28.6* 24.8* 26.8*  MCV  --  96.3 97.6 97.5  PLT  --  181 182 956   Basic Metabolic Panel: Recent Labs  Lab 07/31/20 1812 08/02/20 0351  NA 134* 133*  K 4.5 5.3*  CL 98 100  CO2 25 21*  GLUCOSE 80 154*  BUN 17 32*  CREATININE 1.30* 1.66*  CALCIUM 9.2 9.0   Liver Function Tests: No results for input(s): AST, ALT, ALKPHOS, BILITOT, PROT, ALBUMIN in the last 168 hours. No results for input(s): LIPASE, AMYLASE in the last 168 hours. No results for input(s): AMMONIA in the last 168 hours. Cardiac Enzymes: No results for input(s): CKTOTAL, CKMB, CKMBINDEX, TROPONINI in the last 168 hours. BNP (last 3 results) Recent Labs    11/06/19 1405 01/20/20 0502 08/01/20 1026  BNP 1,500.0* 603.0* 849.5*    ProBNP (last 3 results) No results for input(s): PROBNP in the last 8760 hours.  CBG: No results for input(s): GLUCAP in the last 168 hours. Recent Results (from the past 240 hour(s))  Respiratory Panel by RT PCR (Flu A&B, Covid) - Nasopharyngeal Swab     Status: None   Collection Time: 08/01/20 10:25 AM   Specimen: Nasopharyngeal Swab  Result Value Ref Range Status   SARS Coronavirus 2 by RT PCR NEGATIVE NEGATIVE Final    Comment: (NOTE) SARS-CoV-2 target nucleic acids are NOT DETECTED.  The SARS-CoV-2 RNA is generally detectable in upper respiratoy specimens during the acute phase of infection. The lowest concentration of SARS-CoV-2 viral copies this assay can detect is 131 copies/mL. A negative result does not  preclude SARS-Cov-2 infection and should not be used as the sole basis for treatment or other patient management decisions. A negative result may occur with  improper specimen collection/handling, submission of specimen other than nasopharyngeal swab, presence of viral mutation(s) within the areas targeted by this assay, and inadequate number of viral copies (<131 copies/mL). A negative result must be combined with clinical observations, patient history, and epidemiological information. The expected result is Negative.  Fact Sheet for Patients:  PinkCheek.be  Fact Sheet for Healthcare Providers:  GravelBags.it  This test is no t yet approved or cleared by the [place] FDA and  has been authorized for detection and/or diagnosis of SARS-CoV-2 by FDA under an Emergency Use Authorization (EUA). This EUA will remain  in effect (meaning this test can be used) for the duration of  the COVID-19 declaration under Section 564(b)(1) of the Act, 21 U.S.C. section 360bbb-3(b)(1), unless the authorization is terminated or revoked sooner.     Influenza A by PCR NEGATIVE NEGATIVE Final   Influenza B by PCR NEGATIVE NEGATIVE Final    Comment: (NOTE) The Xpert Xpress SARS-CoV-2/FLU/RSV assay is intended as an aid in  the diagnosis of influenza from Nasopharyngeal swab specimens and  should not be used as a sole basis for treatment. Nasal washings and  aspirates are unacceptable for Xpert Xpress SARS-CoV-2/FLU/RSV  testing.  Fact Sheet for Patients: PinkCheek.be  Fact Sheet for Healthcare Providers: GravelBags.it  This test is not yet approved or cleared by the [place] FDA and  has been authorized for detection and/or diagnosis of SARS-CoV-2 by  FDA under an Emergency Use Authorization (EUA). This EUA will remain  in effect (meaning this test can be used) for the  duration of the  Covid-19 declaration under Section 564(b)(1) of the Act, 21  U.S.C. section 360bbb-3(b)(1), unless the authorization is  terminated or revoked. Performed at [facility], [address] [place], [place] [address]      Studies: No results found.    Flora Lipps, MD  [facility] 08/03/2020

## 2020-08-03 NOTE — Progress Notes (Signed)
[facility] Cardiology  SUBJECTIVE: Patient laying in bed, denies chest pain but reports additional shortness of breath   Vitals:   08/02/20 2117 08/02/20 2353 08/03/20 0413 08/03/20 0531  BP: 122/78 (!) 108/95 127/85 129/85  Pulse:  60 (!) 104   Resp:   16   Temp:  98 F (36.7 C) 98.1 F (36.7 C)   TempSrc:  Oral Oral   SpO2:  97% 98%   Weight:   62.6 kg   Height:         Intake/Output Summary (Last 24 hours) at 08/03/2020 0738 Last data filed at 08/03/2020 0437 Gross per 24 hour  Intake 350 ml  Output 300 ml  Net 50 ml      PHYSICAL EXAM  General: Well developed, well nourished, in no acute distress HEENT:  Normocephalic and atramatic Neck:  No JVD.  Lungs: Clear bilaterally to auscultation and percussion. Heart: HRRR . Normal S1 and S2 without gallops or murmurs.  Abdomen: Bowel sounds are positive, abdomen soft and non-tender  Msk:  Back normal, normal gait. Normal strength and tone for age. Extremities: No clubbing, cyanosis or edema.   Neuro: Alert and oriented X 3. Psych:  Good affect, responds appropriately   LABS: Basic Metabolic Panel: Recent Labs    07/31/20 1812 08/02/20 0351  NA 134* 133*  K 4.5 5.3*  CL 98 100  CO2 25 21*  GLUCOSE 80 154*  BUN 17 32*  CREATININE 1.30* 1.66*  CALCIUM 9.2 9.0   Liver Function Tests: No results for input(s): AST, ALT, ALKPHOS, BILITOT, PROT, ALBUMIN in the last 72 hours. No results for input(s): LIPASE, AMYLASE in the last 72 hours. CBC: Recent Labs    08/02/20 0351 08/03/20 0537  WBC 3.6* 7.2  HGB 8.4* 9.2*  HCT 24.8* 26.8*  MCV 97.6 97.5  PLT 182 179   Cardiac Enzymes: No results for input(s): CKTOTAL, CKMB, CKMBINDEX, TROPONINI in the last 72 hours. BNP: Invalid input(s): POCBNP D-Dimer: No results for input(s): DDIMER in the last 72 hours. Hemoglobin A1C: No results for input(s): HGBA1C in the last 72 hours. Fasting Lipid Panel: No results for input(s): CHOL, HDL, LDLCALC, TRIG, CHOLHDL,  LDLDIRECT in the last 72 hours. Thyroid Function Tests: Recent Labs    08/01/20 1025  TSH 0.040*   Anemia Panel: No results for input(s): VITAMINB12, FOLATE, FERRITIN, TIBC, IRON, RETICCTPCT in the last 72 hours.  No results found.   Echo LVEF 50 to 55% by 2D echocardiogram 11/07/2019  TELEMETRY: Sinus rhythm 80 bpm:  ASSESSMENT AND PLAN:  Principal Problem:   COPD with acute exacerbation (HCC) Active Problems:   HTN (hypertension)   Atrial fibrillation (HCC)   Anemia of chronic kidney failure, stage 4 (severe) (HCC)   Depression   Chronic diastolic CHF (congestive heart failure) (HCC)   Atrial fibrillation with rapid ventricular response ([place])    1.  Atrial fibrillation with rapid ventricular rate, in the setting of COPD exacerbation,  improved after starting diltiazem infusion, converted to sinus rhythm 2.COPD exacerbation 3.History of GI bleed, while on Eliquis, though unclear indication forEliquis, patient aware of history of atrial fibrillation 4.Anemia, hemoglobin and hematocrit 9.2 and 26.8, respectively   Recommendations  1.  Agree with current therapy 2.  Transition diltiazem to Cardizem CD 180 mg daily 3.  Continue carvedilol 12.5 mg twice daily 4.  DC heparin 5.  Long-term solution for stroke prevention such as Watchman device (left atrial occluder) per Dr. [name] as outpatient    Sheppard Coil  Daveion Robar, MD, PhD, [facility] 08/03/2020 7:38 AM

## 2020-08-04 LAB — CBC
HCT: 25.4 % — ABNORMAL LOW (ref 36.0–46.0)
Hemoglobin: 8.6 g/dL — ABNORMAL LOW (ref 12.0–15.0)
MCH: 33.3 pg (ref 26.0–34.0)
MCHC: 33.9 g/dL (ref 30.0–36.0)
MCV: 98.4 fL (ref 80.0–100.0)
Platelets: 177 10*3/uL (ref 150–400)
RBC: 2.58 MIL/uL — ABNORMAL LOW (ref 3.87–5.11)
RDW: 14.8 % (ref 11.5–15.5)
WBC: 7.4 10*3/uL (ref 4.0–10.5)
nRBC: 0.8 % — ABNORMAL HIGH (ref 0.0–0.2)

## 2020-08-04 LAB — BASIC METABOLIC PANEL
Anion gap: 9 (ref 5–15)
BUN: 51 mg/dL — ABNORMAL HIGH (ref 8–23)
CO2: 25 mmol/L (ref 22–32)
Calcium: 9.1 mg/dL (ref 8.9–10.3)
Chloride: 103 mmol/L (ref 98–111)
Creatinine, Ser: 1.72 mg/dL — ABNORMAL HIGH (ref 0.44–1.00)
GFR, Estimated: 30 mL/min — ABNORMAL LOW (ref >60)
Glucose, Bld: 105 mg/dL — ABNORMAL HIGH (ref 70–99)
Potassium: 4.1 mmol/L (ref 3.5–5.1)
Sodium: 137 mmol/L (ref 135–145)

## 2020-08-04 LAB — PHOSPHORUS: Phosphorus: 1.8 mg/dL — ABNORMAL LOW (ref 2.5–4.6)

## 2020-08-04 LAB — T3, FREE: T3, Free: 1.5 pg/mL — ABNORMAL LOW (ref 2.0–4.4)

## 2020-08-04 LAB — MAGNESIUM: Magnesium: 2 mg/dL (ref 1.7–2.4)

## 2020-08-04 MED ORDER — POTASSIUM & SODIUM PHOSPHATES 280-160-250 MG PO PACK
1.0000 | PACK | Freq: Three times a day (TID) | ORAL | Status: AC
  Filled 2020-08-04: qty 1

## 2020-08-04 MED ORDER — FUROSEMIDE 10 MG/ML IJ SOLN
40.0000 mg | Freq: Once | INTRAMUSCULAR | Status: AC
  Administered 2020-08-04: 40 mg via INTRAVENOUS
  Filled 2020-08-04: qty 4

## 2020-08-04 MED ORDER — SODIUM CHLORIDE 0.9% FLUSH
3.0000 mL | Freq: Two times a day (BID) | INTRAVENOUS | Status: DC
  Administered 2020-08-04 – 2020-08-05 (×2): 3 mL via INTRAVENOUS

## 2020-08-04 NOTE — Progress Notes (Signed)
Physical Therapy Treatment Patient Details Name: Danielle [name] MRN: [id number] DOB: [birth year] Today's Date: 08/04/2020    History of Present Illness Danielle [name] is a 63yoF who comes to [facility] on 10/17 c SOB beyond her baseline. Pt admitted in COPD exacerbation. PMH: COPD c CRF on 2L O2 chronic, HTN, dCHF, Stg3CKD, ostomy.    PT Comments    Pt in bed upon entry, sitting up. She reports feeling ok at rest, but continues to have dyspnea with basic activity in the bed no improvement from previous 2 days. Pt @ 100% SpO2 on 3L/min at entry, drop to 94% SpO2 after moving to EOB, dyspnea rated to 3 of 4. Pt tolerates STS 3x with hand held assist, mild unsteadiness, but dyspnea again worsens. Upon chart review noted Hb: drop from 9-10 range 10 days ago, to 8s recently. Pt reports some dark stools in ostomy within the past 7-10 days. Pt also reports remote GIB, after which her anticoagulation was changed. RN made aware of pt's comments, authors suspects some ABLA contributing to multifactorial breathlessness, unclear if medical team aware of dark stools PTA, as pt reports MD not aware.    Follow Up Recommendations  Home health PT;Supervision - Intermittent     Equipment Recommendations  None recommended by PT    Recommendations for Other Services       Precautions / Restrictions Precautions Precautions: Fall    Mobility  Bed Mobility Overal bed mobility: Modified Independent Bed Mobility: Supine to Sit              Transfers Overall transfer level: Needs assistance Equipment used: 1 person hand held assist Transfers: Sit to/from Stand Sit to Stand: Min guard         General transfer comment: has increased dyspnea with activity, SpO2 remains in 90s%  Ambulation/Gait Ambulation/Gait assistance:  (deferred due to continued SOB)               Stairs             Wheelchair Mobility    Modified Rankin (Stroke Patients Only)       Balance            Standing balance support: Bilateral upper extremity supported;During functional activity Standing balance-Leahy Scale: Fair                              Cognition Arousal/Alertness: Awake/alert Behavior During Therapy: WFL for tasks assessed/performed Overall Cognitive Status: Within Functional Limits for tasks assessed                                        Exercises Other Exercises Other Exercises: 3x STS from EOB, BUE support    General Comments        Pertinent Vitals/Pain Pain Assessment: No/denies pain    Home Living                      Prior Function            PT Goals (current goals can now be found in the care plan section) Acute Rehab PT Goals Patient Stated Goal: to get stronger and return home PT Goal Formulation: With patient Time For Goal Achievement: 08/16/20 Potential to Achieve Goals: Fair Progress towards PT goals: Not progressing toward goals - comment    Frequency  Min 2X/week      PT Plan Current plan remains appropriate    Co-evaluation              AM-PAC PT "6 Clicks" Mobility   Outcome Measure  Help needed turning from your back to your side while in a flat bed without using bedrails?: None Help needed moving from lying on your back to sitting on the side of a flat bed without using bedrails?: None Help needed moving to and from a bed to a chair (including a wheelchair)?: None Help needed standing up from a chair using your arms (e.g., wheelchair or bedside chair)?: None Help needed to walk in hospital room?: A Little Help needed climbing 3-5 steps with a railing? : A Little 6 Click Score: 22    End of Session Equipment Utilized During Treatment: Oxygen Activity Tolerance: Patient tolerated treatment well;Patient limited by fatigue Patient left: in bed;with nursing/sitter in room Nurse Communication: Mobility status PT Visit Diagnosis: Unsteadiness on feet (R26.81);Difficulty in  walking, not elsewhere classified (R26.2);Other abnormalities of gait and mobility (R26.89)     Time: 0315-9458 PT Time Calculation (min) (ACUTE ONLY): 22 min  Charges:  $Therapeutic Exercise: 8-22 mins                     1:21 PM, 08/04/20 Etta Grandchild, PT, DPT Physical Therapist - [facility]  [phone] ([place])    Danielle [name] C 08/04/2020, 1:16 PM

## 2020-08-04 NOTE — TOC Initial Note (Signed)
Transition of Care [facility]) - Initial/Assessment Note    Patient Details  Name: Danielle [name] MRN: [id number] Date of Birth: 1956-09-04  [facility] of [facility]) CM/SW Contact:    Eileen Stanford, LCSW Phone Number: 08/04/2020, 1:12 PM  Clinical Narrative:  Pt is alert and oriented. At this time pt is declining HH. Pt states she has 02 through [employer] however, she would like to switch to a smaller portable tank because the one she has now is too heavy and throws off pt's balance. Information provided to [facility] with [employer].                  Expected Discharge Plan: [place] Barriers to Discharge: Continued Medical Work up   Patient Goals and CMS Choice Patient states their goals for this hospitalization and ongoing recovery are:: to get better   Choice offered to / list presented to : Patient  Expected Discharge Plan and Services Expected Discharge Plan: [place] In-house Referral: Clinical Social Work   Post Acute Care Choice: NA Living arrangements for the past 2 months: Single Family Home                 DME Arranged: Oxygen DME Agency: [employer] Date DME Agency Contacted: 08/04/20 Time DME Agency Contacted: 70 Representative spoke with at DME Agency: [email or web address]            Prior Living Arrangements/Services Living arrangements for the past 2 months: Single Family Home Lives with:: Adult Children Patient language and need for interpreter reviewed:: Yes Do you feel safe going back to the place where you live?: Yes      Need for Family Participation in Patient Care: Yes (Comment) Care giver support system in place?: Yes (comment)   Criminal Activity/Legal Involvement Pertinent to Current Situation/Hospitalization: No - Comment as needed  Activities of Daily Living Home Assistive Devices/Equipment: Cane (specify quad or straight), Walker (specify type) ADL Screening (condition at time of admission) Patient's cognitive ability adequate  to safely complete daily activities?: Yes Is the patient deaf or have difficulty hearing?: No Does the patient have difficulty seeing, even when wearing glasses/contacts?: No Does the patient have difficulty concentrating, remembering, or making decisions?: No Patient able to express need for assistance with ADLs?: Yes Does the patient have difficulty dressing or bathing?: No Independently performs ADLs?: Yes (appropriate for developmental age) Does the patient have difficulty walking or climbing stairs?: No Weakness of Legs: None Weakness of Arms/Hands: None  Permission Sought/Granted Permission sought to share information with : Family Supports    Share Information with NAME: [name]  Permission granted to share info w AGENCY: [employer]  Permission granted to share info w Relationship: daughter     Emotional Assessment Appearance:: Appears stated age Attitude/Demeanor/Rapport: Engaged Affect (typically observed): Accepting, Appropriate Orientation: : Oriented to Self, Oriented to Place, Oriented to  Time, Oriented to Situation Alcohol / Substance Use: Not Applicable Psych Involvement: No (comment)  Admission diagnosis:  SOB (shortness of breath) [R06.02] COPD exacerbation (HCC) [J44.1] Atrial fibrillation with rapid ventricular response (HCC) [I48.91] COPD with acute exacerbation ([place]) [J44.1] Patient Active Problem List   Diagnosis Date Noted  . COPD with acute exacerbation ([place]) 08/01/2020  . Atrial fibrillation with rapid ventricular response ([place]) 08/01/2020  . Cellulitis of left arm 01/20/2020  . CKD (chronic kidney disease), stage IV ([place]) 01/20/2020  . Hypokalemia 01/20/2020  . Elevated troponin 01/20/2020  . Sepsis ([place]) 01/20/2020  . Chronic diastolic CHF (  congestive heart failure) ([place]) 01/20/2020  . CHF exacerbation ([place]) 11/06/2019  . Diarrhea   . Acute renal failure superimposed on stage 3b chronic kidney disease ([place]) 10/29/2019  . GERD (gastroesophageal reflux  disease) 10/29/2019  . Depression 10/29/2019  . Hyperkalemia 10/14/2019  . History of 2019 novel coronavirus disease (COVID-19) 10/14/2019  . Acute respiratory failure due to COVID-19 ([place]) 09/27/2019  . Respiratory tract infection due to COVID-19 virus 09/27/2019  . Acute blood loss anemia 06/20/2019  . Anemia 06/20/2019  . GI bleeding 06/19/2019  . Anemia of chronic kidney failure, stage 4 (severe) ([place]) 06/12/2019  . Malignant neoplasm of lower third of esophagus ([place]) 05/01/2019  . Acute on chronic respiratory failure with hypoxia and hypercapnia ([place]) 12/22/2018  . Ileostomy dysfunction ([place]) 09/28/2018  . Reflux esophagitis   . Iron deficiency anemia secondary to blood loss (chronic)   . SBO (small bowel obstruction) ([place]) 06/29/2018  . Chronic systolic heart failure ([place]) 06/09/2018  . HTN (hypertension) 06/09/2018  . Atrial fibrillation ([place]) 06/09/2018  . Lymphedema 06/09/2018  . Acute kidney injury superimposed on CKD ([place]) 06/04/2018  . Severe protein-calorie malnutrition ([place]) 06/04/2018  . Blood in stool   . Focal (segmental) acute (reversible) ischemia of large intestine ([place])   . Ulceration of intestine   . Diverticulosis of large intestine without diverticulitis   . Abnormal CT scan, colon   . Colitis   . COPD exacerbation ([place])   . Malnutrition of moderate degree 04/23/2018  . Palliative care by specialist   . DNR (do not resuscitate) discussion   . Weakness generalized   . Hyponatremia 02/10/2017  . Syncope 02/09/2017  . Lung nodule, solitary 07/25/2016  . Malignant neoplasm of upper lobe of right lung ([place]) 07/25/2016  . Carcinoma of overlapping sites of left breast in female, estrogen receptor positive ([place]) 06/22/2016  . Multiple lung nodules 06/22/2016  . Lesion of right lung   . Breast cancer in female [facility]) 02/21/2016  . Moderate COPD (chronic obstructive pulmonary disease) ([place]) 08/24/2014   PCP:  [facility], [place]:   [place], [place], [place] [address] [place] [address] Phone: [phone] Fax: [phone]  [place], [place] [address] Phone: [phone] Fax: [place], [place] - [place] [address] Phone: [phone] Fax: [phone]     Social Determinants of Health (SDOH) Interventions    Readmission Risk Interventions Readmission Risk Prevention Plan 10/16/2019 09/28/2019 06/20/2019  Transportation Screening Complete Complete Complete  PCP or Specialist Appt within 3-5 Days - - -  [place] or [place] for [place] - - -  Medication Review (RN Care Manager) Referral to Pharmacy Complete Complete  HRI or Home Care Consult Complete Patient refused Complete  SW Recovery Care/Counseling Consult Complete Complete -  Palliative Care Screening Not Applicable Not Applicable Not Applicable  Skilled Nursing Facility Not Applicable Not Applicable -  Some recent data might be hidden

## 2020-08-04 NOTE — Progress Notes (Signed)
[facility] Cardiology    SUBJECTIVE: The patient denies chest pain or palpitations. She does reports feeling short of breath when adjusting herself in her bed.   Vitals:   08/03/20 1526 08/03/20 1946 08/04/20 0407 08/04/20 0840  BP: 124/79 (!) 142/81 (!) 148/78 (!) 176/95  Pulse: 77 84 85 90  Resp: (!) 22 20 20 18   Temp: 98.3 F (36.8 C) 98.3 F (36.8 C) 98.3 F (36.8 C) 97.6 F (36.4 C)  TempSrc: Oral Oral  Oral  SpO2: 99% 94% 98% 95%  Weight:   61.8 kg   Height:         Intake/Output Summary (Last 24 hours) at 08/04/2020 3235 Last data filed at 08/04/2020 5732 Gross per 24 hour  Intake 477 ml  Output 2100 ml  Net -1623 ml      PHYSICAL EXAM  General: Thin female, appears older than stated age, sitting up in bed eating breakfast, in no acute distress HEENT:  Normocephalic and atramatic Neck:  No JVD.  Lungs: normal effort of breathing, speaking in full sentences, on supplemental O2 via [place] Heart: HRRR . Normal S1 and S2 without gallops or murmurs.  Abdomen: nondistended Msk:  Back normal, gait not assessed.  Extremities: No clubbing, cyanosis or edema.   Neuro: Alert and oriented X 3. Psych:  Good affect, responds appropriately   LABS: Basic Metabolic Panel: Recent Labs    08/02/20 0351 08/04/20 0440  NA 133* 137  K 5.3* 4.1  CL 100 103  CO2 21* 25  GLUCOSE 154* 105*  BUN 32* 51*  CREATININE 1.66* 1.72*  CALCIUM 9.0 9.1  MG  --  2.0  PHOS  --  1.8*   Liver Function Tests: No results for input(s): AST, ALT, ALKPHOS, BILITOT, PROT, ALBUMIN in the last 72 hours. No results for input(s): LIPASE, AMYLASE in the last 72 hours. CBC: Recent Labs    08/03/20 0537 08/04/20 0440  WBC 7.2 7.4  HGB 9.2* 8.6*  HCT 26.8* 25.4*  MCV 97.5 98.4  PLT 179 177   Cardiac Enzymes: No results for input(s): CKTOTAL, CKMB, CKMBINDEX, TROPONINI in the last 72 hours. BNP: Invalid input(s): POCBNP D-Dimer: No results for input(s): DDIMER in the last 72  hours. Hemoglobin A1C: No results for input(s): HGBA1C in the last 72 hours. Fasting Lipid Panel: No results for input(s): CHOL, HDL, LDLCALC, TRIG, CHOLHDL, LDLDIRECT in the last 72 hours. Thyroid Function Tests: Recent Labs    08/01/20 1025 08/02/20 1727  TSH 0.040*  --   T3FREE  --  1.5*   Anemia Panel: No results for input(s): VITAMINB12, FOLATE, FERRITIN, TIBC, IRON, RETICCTPCT in the last 72 hours.  No results found.   TELEMETRY: NSR, 94 bpm  ASSESSMENT AND PLAN:  Principal Problem:   COPD with acute exacerbation (HCC) Active Problems:   HTN (hypertension)   Atrial fibrillation (HCC)   Anemia of chronic kidney failure, stage 4 (severe) (HCC)   Depression   Chronic diastolic CHF (congestive heart failure) (HCC)   Atrial fibrillation with rapid ventricular response ([place])    1.Atrial fibrillation with rapid ventricular rate, in the setting of COPD exacerbation,  improved after starting diltiazem infusion, converted to sinus rhythm. Previously on heparin, which was discontinued. 2.COPD exacerbation, remains short of breath this morning 3.History of GI bleed, while on Eliquis, though unclear previous indication forEliquis, patient not aware of history of atrial fibrillation 4.Anemia,hemoglobin and hematocrit worse this morning, 8.6 and 25.4, respectively   Recommendations: 1. Continue carvedilol 12.5 mg  BID and Cardizem CD 180 mg daily for rate control and hypertension 2. Anemia management per hospitalist 3. Defer chronic anticoagulation at this time in light of history of GI bleed and current anemia.  4. Recommend discussion of long-term solution for stroke prevention, such as Watchman device, with Dr. [name] as outpatient.  Sign off for now; please call/haiku with any questions.  Discussed with Dr. Saralyn Pilar; plan made in collaboration with him .     Clabe Seal, PA-C 08/04/2020 9:29 AM

## 2020-08-04 NOTE — Progress Notes (Addendum)
PROGRESS NOTE  Danielle [name] FAO:[id number] DOB: 1956-01-16 DOA: 08/01/2020 PCP: [facility] [place] [facility]   LOS: 3 days   Brief narrative: As per HPI,  Danielle [name] is a 64 y.o. female with medical history significant for COPD with chronic respiratory failure on 2 L of oxygen as needed, hypertension, chronic diastolic dysfunction CHF, chronic kidney disease stage IIIb, history of GI bleed, remote history of breast cancer presented to the hospital with dry cough and shortness of breath that was worse with exertion.She was found to have atrial fibrillation with RVR and COPD exacerbation.  Heart rate was in the 150s.  Oxygen saturation was 85% on room air.  She was treated with IV Cardizem infusion and IV heparin drip.  She was also treated with steroids and bronchodilators.  She was seen in consultation by the cardiologist.  Assessment/Plan:  Principal Problem:   COPD with acute exacerbation ([place]) Active Problems:   HTN (hypertension)   Atrial fibrillation (HCC)   Anemia of chronic kidney failure, stage 4 (severe) (HCC)   Depression   Chronic diastolic CHF (congestive heart failure) (HCC)   Atrial fibrillation with rapid ventricular response (HCC)   Acute COPD exacerbation: Continue prednisone, bronchodilators, chronic oxygen supplementation.  Patient has dyspnea on exertion and is very symptomatic at this time.  Uses oxygen at home as needed. Patient is still decompensated and dyspneic on minimal exertion despite HR controlled. Unsure etiology. Will give one dose of lasix. Will consider CT chest if not improved   Acute on chronic hypoxemic respiratory failure: Continue 2 L/min oxygen via nasal cannula.  Patient uses 2 L of nasal cannula at home as needed.  Continue treatment for COPD and A. Fib. She requests for portable tank at home.   Atrial fibrillation with RVR: currently improved and in sinus rhythm. Currently on oral Cardizem and Coreg.  Cardiology on board.  Cardiology  thinking about watchman device for long-term.  On oral torsemide PRN. 2 D echo from 1/21 with preserved LV function.    Low TSH 0.040: TSH was 0.601 in March 2020.  She may have subclinical hyperthyroidism.  Free T4 at 0.75.  Will need outpatient monitoring in 6 to 8 weeks.   Anemia of chronic disease: H&H is stable.  Hemoglobin of 8.6 today.    History of GI bleed while on Eliquis in the past. On subq heparin at this time  Hypophosphatemia- mild. Will replace orally with K phos today. Check levels in am.  Mild acute kidney injury.  Will need to closely monitor.   DVT prophylaxis: heparin injection 5,000 Units Start: 08/03/20 1415.  Code Status: Full code  Family Communication: None  Status is: Inpatient  Remains inpatient appropriate because:IV treatments appropriate due to intensity of illness or inability to take PO, Inpatient level of care appropriate due to severity of illness and Cardiology follow-up , ongoing dyspnea with exertion,  Dispo: The patient is from: Home              Anticipated d/c is to: Home              Anticipated d/c date is: 1-2 days, still very symptomatic and dyspneic               Patient currently is not medically stable to d/c.  Consultants: Cardiology  Procedures: None  Antibiotics:  None  Anti-infectives (From admission, onward)    None      Subjective: Today, patient still complains of dyspnea on minimal exertion,  no cough, chest pain or fever. No chills or sputum production.    Objective: Vitals:   08/03/20 1946 08/04/20 0407  BP: (!) 142/81 (!) 148/78  Pulse: 84 85  Resp: 20 20  Temp: 98.3 F (36.8 C) 98.3 F (36.8 C)  SpO2: 94% 98%    Intake/Output Summary (Last 24 hours) at 08/04/2020 0743 Last data filed at 08/04/2020 0650 Gross per 24 hour  Intake 477 ml  Output 2100 ml  Net -1623 ml   Filed Weights   07/31/20 1755 08/03/20 0413 08/04/20 0407  Weight: 55.3 kg 62.6 kg 61.8 kg   Body mass index is 22 kg/m.    Physical Exam: GENERAL: Patient is alert awake and oriented. Not in obvious distress.  On nasal cannula oxygen. Appears tachyonic and dyspneic. HENT: No scleral pallor or icterus. Pupils equally reactive to light. Oral mucosa is moist NECK: is supple, no gross swelling noted. CHEST: Breath sounds bilaterally diminished.  Coarse breath sounds noted.    CVS: S1 and S2 heard, no murmur.  Regular rhythm ABDOMEN: Soft, non-tender, bowel sounds are present. EXTREMITIES: No edema. CNS: Cranial nerves are intact. No focal motor deficits. SKIN: warm and dry without rashes.  Data Review: I have personally reviewed the following laboratory data and studies,  CBC: Recent Labs  Lab 07/31/20 1812 08/02/20 0351 08/03/20 0537 08/04/20 0440  WBC 7.5 3.6* 7.2 7.4  HGB 9.9* 8.4* 9.2* 8.6*  HCT 28.6* 24.8* 26.8* 25.4*  MCV 96.3 97.6 97.5 98.4  PLT 181 182 179 034   Basic Metabolic Panel: Recent Labs  Lab 07/31/20 1812 08/02/20 0351 08/04/20 0440  NA 134* 133* 137  K 4.5 5.3* 4.1  CL 98 100 103  CO2 25 21* 25  GLUCOSE 80 154* 105*  BUN 17 32* 51*  CREATININE 1.30* 1.66* 1.72*  CALCIUM 9.2 9.0 9.1  MG  --   --  2.0  PHOS  --   --  1.8*   Liver Function Tests: No results for input(s): AST, ALT, ALKPHOS, BILITOT, PROT, ALBUMIN in the last 168 hours. No results for input(s): LIPASE, AMYLASE in the last 168 hours. No results for input(s): AMMONIA in the last 168 hours. Cardiac Enzymes: No results for input(s): CKTOTAL, CKMB, CKMBINDEX, TROPONINI in the last 168 hours. BNP (last 3 results) Recent Labs    11/06/19 1405 01/20/20 0502 08/01/20 1026  BNP 1,500.0* 603.0* 849.5*    ProBNP (last 3 results) No results for input(s): PROBNP in the last 8760 hours.  CBG: No results for input(s): GLUCAP in the last 168 hours. Recent Results (from the past 240 hour(s))  Respiratory Panel by RT PCR (Flu A&B, Covid) - Nasopharyngeal Swab     Status: None   Collection Time: 08/01/20 10:25  AM   Specimen: Nasopharyngeal Swab  Result Value Ref Range Status   SARS Coronavirus 2 by RT PCR NEGATIVE NEGATIVE Final    Comment: (NOTE) SARS-CoV-2 target nucleic acids are NOT DETECTED.  The SARS-CoV-2 RNA is generally detectable in upper respiratoy specimens during the acute phase of infection. The lowest concentration of SARS-CoV-2 viral copies this assay can detect is 131 copies/mL. A negative result does not preclude SARS-Cov-2 infection and should not be used as the sole basis for treatment or other patient management decisions. A negative result may occur with  improper specimen collection/handling, submission of specimen other than nasopharyngeal swab, presence of viral mutation(s) within the areas targeted by this assay, and inadequate number of viral copies (<131 copies/mL). A  negative result must be combined with clinical observations, patient history, and epidemiological information. The expected result is Negative.  Fact Sheet for Patients:  PinkCheek.be  Fact Sheet for Healthcare Providers:  GravelBags.it  This test is no t yet approved or cleared by the [place] FDA and  has been authorized for detection and/or diagnosis of SARS-CoV-2 by FDA under an Emergency Use Authorization (EUA). This EUA will remain  in effect (meaning this test can be used) for the duration of the COVID-19 declaration under Section 564(b)(1) of the Act, 21 U.S.C. section 360bbb-3(b)(1), unless the authorization is terminated or revoked sooner.     Influenza A by PCR NEGATIVE NEGATIVE Final   Influenza B by PCR NEGATIVE NEGATIVE Final    Comment: (NOTE) The Xpert Xpress SARS-CoV-2/FLU/RSV assay is intended as an aid in  the diagnosis of influenza from Nasopharyngeal swab specimens and  should not be used as a sole basis for treatment. Nasal washings and  aspirates are unacceptable for Xpert Xpress SARS-CoV-2/FLU/RSV   testing.  Fact Sheet for Patients: PinkCheek.be  Fact Sheet for Healthcare Providers: GravelBags.it  This test is not yet approved or cleared by the [place] FDA and  has been authorized for detection and/or diagnosis of SARS-CoV-2 by  FDA under an Emergency Use Authorization (EUA). This EUA will remain  in effect (meaning this test can be used) for the duration of the  Covid-19 declaration under Section 564(b)(1) of the Act, 21  U.S.C. section 360bbb-3(b)(1), unless the authorization is  terminated or revoked. Performed at [facility], [address] [place], [place] [address]      Studies: No results found.    Flora Lipps, MD  [facility] 08/04/2020

## 2020-08-04 NOTE — Progress Notes (Signed)
Mobility Specialist - Progress Note   08/04/20 1359  Mobility  Range of Motion/Exercises Right leg;Left leg (ankle pumps, heel slides, hip iso, hip add/abd, slr)  Level of Assistance Standby assist, set-up cues, supervision of patient - no hands on  Assistive Device None  Distance Ambulated (ft) 0 ft  Mobility Response Tolerated well  Mobility performed by Mobility specialist  $Mobility charge 1 Mobility    Pre-mobility: 82 HR, 97% SpO2 Post-mobility: 88 HR, 95% SpO2   Pt was long-sitting in bed upon arrival. Pt agreed to session. Pt denied any pain, nausea, or fatigue. Pt declined OOB activity, and when encouraged to get EOB, pt also declined stating she'd "rather remain in bed as she waits for a BM". Pt was able to perform supine exercises: ankle pumps, heel slides, straight leg raises, hip isometrics, and hip add/abd. Overall, pt tolerated session well. Pt remains in bed with all needs in reach and alarm set.    Danielle [name] Mobility Specialist 08/04/20, 2:03 PM

## 2020-08-04 NOTE — Care Management Important Message (Signed)
Important Message  Patient Details  Name: Danielle [name] MRN: [id number] Date of Birth: 13-Feb-1956   Medicare Important Message Given:  Yes     Dannette Kandas 08/04/2020, 1:40 PM

## 2020-08-05 LAB — CBC
HCT: 30.1 % — ABNORMAL LOW (ref 36.0–46.0)
Hemoglobin: 10.3 g/dL — ABNORMAL LOW (ref 12.0–15.0)
MCH: 33.4 pg (ref 26.0–34.0)
MCHC: 34.2 g/dL (ref 30.0–36.0)
MCV: 97.7 fL (ref 80.0–100.0)
Platelets: 195 10*3/uL (ref 150–400)
RBC: 3.08 MIL/uL — ABNORMAL LOW (ref 3.87–5.11)
RDW: 14.7 % (ref 11.5–15.5)
WBC: 7.9 10*3/uL (ref 4.0–10.5)
nRBC: 0 % (ref 0.0–0.2)

## 2020-08-05 LAB — BASIC METABOLIC PANEL
Anion gap: 10 (ref 5–15)
BUN: 49 mg/dL — ABNORMAL HIGH (ref 8–23)
CO2: 29 mmol/L (ref 22–32)
Calcium: 9.7 mg/dL (ref 8.9–10.3)
Chloride: 99 mmol/L (ref 98–111)
Creatinine, Ser: 1.64 mg/dL — ABNORMAL HIGH (ref 0.44–1.00)
GFR, Estimated: 34 mL/min — ABNORMAL LOW (ref >60)
Glucose, Bld: 90 mg/dL (ref 70–99)
Potassium: 3.9 mmol/L (ref 3.5–5.1)
Sodium: 138 mmol/L (ref 135–145)

## 2020-08-05 LAB — PHOSPHORUS: Phosphorus: 1.5 mg/dL — ABNORMAL LOW (ref 2.5–4.6)

## 2020-08-05 LAB — MAGNESIUM: Magnesium: 2 mg/dL (ref 1.7–2.4)

## 2020-08-05 MED ORDER — TORSEMIDE 20 MG PO TABS
20.0000 mg | ORAL_TABLET | ORAL | 1 refills | Status: DC
Start: 2020-08-05 — End: 2020-11-18

## 2020-08-05 MED ORDER — FUROSEMIDE 10 MG/ML IJ SOLN
40.0000 mg | Freq: Once | INTRAMUSCULAR | Status: AC
  Administered 2020-08-05: 40 mg via INTRAVENOUS
  Filled 2020-08-05: qty 4

## 2020-08-05 MED ORDER — DILTIAZEM HCL ER COATED BEADS 180 MG PO CP24
180.0000 mg | ORAL_CAPSULE | Freq: Every day | ORAL | 2 refills | Status: DC
Start: 2020-08-06 — End: 2020-11-18

## 2020-08-05 MED ORDER — POTASSIUM & SODIUM PHOSPHATES 280-160-250 MG PO PACK
2.0000 | PACK | Freq: Once | ORAL | Status: AC
  Administered 2020-08-05: 2 via ORAL
  Filled 2020-08-05: qty 2

## 2020-08-05 MED ORDER — POTASSIUM PHOSPHATE MONOBASIC 500 MG PO TABS: 500.0000 mg | ORAL_TABLET | Freq: Two times a day (BID) | ORAL | 0 refills | Status: AC

## 2020-08-05 MED ORDER — SUCRALFATE 1 GM/10ML PO SUSP: 1.0000 g | Freq: Four times a day (QID) | ORAL | 2 refills | Status: DC

## 2020-08-05 MED ORDER — CARVEDILOL 12.5 MG PO TABS: 12.5000 mg | ORAL_TABLET | Freq: Two times a day (BID) | ORAL | 2 refills | Status: DC

## 2020-08-05 MED ORDER — PANTOPRAZOLE SODIUM 40 MG PO TBEC: 40.0000 mg | DELAYED_RELEASE_TABLET | Freq: Two times a day (BID) | ORAL | 2 refills | Status: AC

## 2020-08-05 NOTE — TOC Transition Note (Signed)
Transition of Care [facility]) - CM/SW Discharge Note   Patient Details  Name: Danielle [name] MRN: [id number] Date of Birth: Oct 04, 1956  [facility]) CM/SW Contact:  Eileen Stanford, LCSW Phone Number: 08/05/2020, 2:06 PM   Clinical Narrative:   Pt refused HH. Pt is medically stable for d/c. [employer] will bring pt a tank to transport home. Pt already has 02 through [employer]. [employer] will do tank exchange at pts home-- pt made aware. Pt's daughter will come pick pt up.     Final next level of care: Home/Self Care Barriers to Discharge: No Barriers Identified   Patient Goals and CMS Choice Patient states their goals for this hospitalization and ongoing recovery are:: to get better   Choice offered to / list presented to : Patient  Discharge Placement                Patient to be transferred to facility by: daughter   Patient and family notified of of transfer: 08/05/20  Discharge Plan and Services In-house Referral: Clinical Social Work   Post Acute Care Choice: NA          DME Arranged: Oxygen DME Agency: [employer] Date DME Agency Contacted: 08/04/20 Time DME Agency Contacted: [id number] Representative spoke with at DME Agency: [email or web address]            Social Determinants of Health ([place]) Interventions     Readmission Risk Interventions Readmission Risk Prevention Plan 10/16/2019 09/28/2019 06/20/2019  Transportation Screening Complete Complete Complete  PCP or Specialist Appt within 3-5 Days - - -  [place] or [place] Work Consult for [place] - - -  Medication Review (RN Care Manager) Referral to Pharmacy Complete Complete  HRI or Home Care Consult Complete Patient refused Complete  SW Recovery Care/Counseling Consult Complete Complete -  Palliative Care Screening Not Applicable Not Applicable Not Applicable  Skilled Nursing Facility Not Applicable Not Applicable -   Some recent data might be hidden

## 2020-08-05 NOTE — Discharge Summary (Signed)
Physician Discharge Summary  Danielle [name] WUX:[id number] DOB: October 14, 1956 DOA: 08/01/2020  [facility]P: [facility] [place] date: 08/01/2020 Discharge date: 08/05/2020  Admitted From: Home  Discharge disposition: home   Recommendations for Outpatient Follow-Up:   . Follow up with your primary care provider in one week.  . Check CBC, BMP, magnesium in the next visit . Follow-up with cardiology in 2 weeks to follow-up for heart failure and atrial fibrillation. . Patient did have a suppressed TSH.  TSH was 0.601 in March 2020. She may have subclinical hyperthyroidism.  This will need to be followed up as outpatient in 4 to 6 weeks.  Discharge Diagnosis:   Principal Problem:   COPD with acute exacerbation ([place]) Active Problems:   HTN (hypertension)   Atrial fibrillation (HCC)   Anemia of chronic kidney failure, stage 4 (severe) (HCC)   Depression   Chronic diastolic CHF (congestive heart failure) (HCC)   Atrial fibrillation with rapid ventricular response ([place])   Discharge Condition: Improved.  Diet recommendation: Low sodium, heart healthy.   Wound care: None.  Code status: Full.   History of Present Illness:   TALLI [name] a 64 y.o.femalewith medical history significant forCOPD with chronic respiratory failure on 2 L of oxygen as needed, hypertension, chronic diastolic dysfunction CHF,chronic kidney diseasestage IIIb, history of GI bleed, remote history of breast cancer presented to the hospital with dry cough and shortness of breath that was worse with exertion.She was found to have atrial fibrillation with RVR and COPD exacerbation. Heart rate was in the 150s. Oxygen saturation was 85% on room air. She was treated with IV Cardizem infusion and IV heparin drip. She was also treated with steroids and bronchodilators.  Patient was then seen by cardiologist as well.  Patient was then admitted to hospital for further evaluation and  treatment.[name]  Hospital Course:   Following conditions were addressed during hospitalization as listed below,  Acute COPD exacerbation:  Patient received steroids with prednisone, bronchodilators, and was continued on oxygen. Patient had significant dyspnea on exertion and improved with the IV diuretics.  At this time patient will be given oral diuretics on discharge and will be considered for 24/7 oxygen at home.  Patient stated that she does not have a portable tank at home.  Case managers were notified about it.   Acute on chronic hypoxemic respiratory failure: Continue 2 L/min oxygen via nasal cannula on discharge..  Patient uses 2 L of nasal cannula at home as needed but will recommend continuous oxygen after discharge.   Atrial fibrillation with RVR: currently improved and in sinus rhythm. Currently on oral Cardizem and Coreg.  Cardiology following the patient during hospitalization.  Cardiology thinking about watchman device for long-term and will need to follow-up with cardiology for this determination. On oral torsemide PRN at home.  This will be changed to daily. 2 D echo from 1/21 with preserved LV function.   Low TSH 0.040: TSH was 0.601 in March 2020. She may have subclinical hyperthyroidism.  Free T4 at 0.75.  Will need outpatient monitoring in 6 to 8 weeks.  Anemia of chronic disease: H&H is stable.    Hemoglobin of 10.3 today.  History of GI bleed while on Eliquis in the past.   Currently off anticoagulation due to risk of GI bleed.  Will need to discuss with cardiology as outpatient for watchman device.  Hypophosphatemia- continue oral phosphate replacement on discharge.  Patient will need to follow-up with her primary care physician  Mild acute kidney injury.    Slightly improved with diuretics.  Will need to monitor as outpatient.  Possibility of cardiorenal syndrome.  Creatinine prior to discharge was 1.6.  Disposition.  At this time, patient is stable for disposition  home with outpatient PCP and cardiology follow-up.  Medical Consultants:    Cardiology  Procedures:    none Subjective:   Today, patient was seen and examined at bedside.  Feels much better today after diuretics were initiated yesterday.  Requesting  portable tank at home.  Discharge Exam:   Vitals:   08/05/20 0759 08/05/20 1153  BP: 128/82 125/85  Pulse: 74 81  Resp: 16 18  Temp: 98.5 F (36.9 C) 98.2 F (36.8 C)  SpO2: 100% 97%   Vitals:   08/05/20 0300 08/05/20 0516 08/05/20 0759 08/05/20 1153  BP:  (!) 143/88 128/82 125/85  Pulse:  70 74 81  Resp:  17 16 18   Temp:  97.6 F (36.4 C) 98.5 F (36.9 C) 98.2 F (36.8 C)  TempSrc:  Oral Oral Oral  SpO2:  100% 100% 97%  Weight: 57 kg 55.8 kg    Height:       General: Alert awake, not in obvious distress, on nasal cannula oxygen HENT: pupils equally reacting to light,  No scleral pallor or icterus noted. Oral mucosa is moist.  Chest:    Diminished breath sounds bilaterally.  Coarse breath sounds noted..  CVS: S1 &S2 heard. No murmur.  Regular rate and rhythm. Abdomen: Soft, nontender, nondistended.  Bowel sounds are heard.   Extremities: No cyanosis, clubbing or edema.  Peripheral pulses are palpable. Psych: Alert, awake and oriented, normal mood CNS:  No cranial nerve deficits.  Power equal in all extremities.   Skin: Warm and dry.  No rashes noted.  The results of significant diagnostics from this hospitalization (including imaging, microbiology, ancillary and laboratory) are listed below for reference.     Diagnostic Studies:   No results found.   Labs:   Basic Metabolic Panel: Recent Labs  Lab 07/31/20 1812 07/31/20 1812 08/02/20 0351 08/02/20 0351 08/04/20 0440 08/05/20 0530  NA 134*  --  133*  --  137 138  K 4.5   < > 5.3*   < > 4.1 3.9  CL 98  --  100  --  103 99  CO2 25  --  21*  --  25 29  GLUCOSE 80  --  154*  --  105* 90  BUN 17  --  32*  --  51* 49*  CREATININE 1.30*  --  1.66*  --   1.72* 1.64*  CALCIUM 9.2  --  9.0  --  9.1 9.7  MG  --   --   --   --  2.0 2.0  PHOS  --   --   --   --  1.8* 1.5*   < > = values in this interval not displayed.   GFR Estimated Creatinine Clearance: 29.7 mL/min (A) (by C-G formula based on SCr of 1.64 mg/dL (H)). Liver Function Tests: No results for input(s): AST, ALT, ALKPHOS, BILITOT, PROT, ALBUMIN in the last 168 hours. No results for input(s): LIPASE, AMYLASE in the last 168 hours. No results for input(s): AMMONIA in the last 168 hours. Coagulation profile Recent Labs  Lab 08/01/20 0130  INR 1.1    CBC: Recent Labs  Lab 07/31/20 1812 08/02/20 0351 08/03/20 0537 08/04/20 0440 08/05/20 0530  WBC 7.5 3.6* 7.2 7.4 7.9  HGB 9.9* 8.4* 9.2* 8.6* 10.3*  HCT 28.6* 24.8* 26.8* 25.4* 30.1*  MCV 96.3 97.6 97.5 98.4 97.7  PLT 181 182 179 177 195   Cardiac Enzymes: No results for input(s): CKTOTAL, CKMB, CKMBINDEX, TROPONINI in the last 168 hours. BNP: Invalid input(s): POCBNP CBG: No results for input(s): GLUCAP in the last 168 hours. D-Dimer No results for input(s): DDIMER in the last 72 hours. Hgb A1c No results for input(s): HGBA1C in the last 72 hours. Lipid Profile No results for input(s): CHOL, HDL, LDLCALC, TRIG, CHOLHDL, LDLDIRECT in the last 72 hours. Thyroid function studies Recent Labs    08/02/20 1727  T3FREE 1.5*   Anemia work up No results for input(s): VITAMINB12, FOLATE, FERRITIN, TIBC, IRON, RETICCTPCT in the last 72 hours. Microbiology Recent Results (from the past 240 hour(s))  Respiratory Panel by RT PCR (Flu A&B, Covid) - Nasopharyngeal Swab     Status: None   Collection Time: 08/01/20 10:25 AM   Specimen: Nasopharyngeal Swab  Result Value Ref Range Status   SARS Coronavirus 2 by RT PCR NEGATIVE NEGATIVE Final    Comment: (NOTE) SARS-CoV-2 target nucleic acids are NOT DETECTED.  The SARS-CoV-2 RNA is generally detectable in upper respiratoy specimens during the acute phase of infection. The  lowest concentration of SARS-CoV-2 viral copies this assay can detect is 131 copies/mL. A negative result does not preclude SARS-Cov-2 infection and should not be used as the sole basis for treatment or other patient management decisions. A negative result may occur with  improper specimen collection/handling, submission of specimen other than nasopharyngeal swab, presence of viral mutation(s) within the areas targeted by this assay, and inadequate number of viral copies (<131 copies/mL). A negative result must be combined with clinical observations, patient history, and epidemiological information. The expected result is Negative.  Fact Sheet for Patients:  PinkCheek.be  Fact Sheet for Healthcare Providers:  GravelBags.it  This test is no t yet approved or cleared by the [place] FDA and  has been authorized for detection and/or diagnosis of SARS-CoV-2 by FDA under an Emergency Use Authorization (EUA). This EUA will remain  in effect (meaning this test can be used) for the duration of the COVID-19 declaration under Section 564(b)(1) of the Act, 21 U.S.C. section 360bbb-3(b)(1), unless the authorization is terminated or revoked sooner.     Influenza A by PCR NEGATIVE NEGATIVE Final   Influenza B by PCR NEGATIVE NEGATIVE Final    Comment: (NOTE) The Xpert Xpress SARS-CoV-2/FLU/RSV assay is intended as an aid in  the diagnosis of influenza from Nasopharyngeal swab specimens and  should not be used as a sole basis for treatment. Nasal washings and  aspirates are unacceptable for Xpert Xpress SARS-CoV-2/FLU/RSV  testing.  Fact Sheet for Patients: PinkCheek.be  Fact Sheet for Healthcare Providers: GravelBags.it  This test is not yet approved or cleared by the [place] FDA and  has been authorized for detection and/or diagnosis of SARS-CoV-2 by  FDA under  an Emergency Use Authorization (EUA). This EUA will remain  in effect (meaning this test can be used) for the duration of the  Covid-19 declaration under Section 564(b)(1) of the Act, 21  U.S.C. section 360bbb-3(b)(1), unless the authorization is  terminated or revoked. Performed at [facility], [address] [place], [place] [address]      Discharge Instructions:   Discharge Instructions    Call MD for:   Complete by: As directed    Excessive shortness of breath, excessive weight gain,  leg swelling.   Diet - low sodium heart healthy   Complete by: As directed    Discharge instructions   Complete by: As directed    Follow-up with your primary care physician in 1 week.  Continue to use oxygen at home. Use medications as prescribed. Follow-up with your cardiologist in 1 week.   Face-to-face encounter (required for Medicare/Medicaid patients)   Complete by: As directed    I Agam Davenport certify that this patient is under my care and that I, or a nurse practitioner or physician's assistant working with me, had a face-to-face encounter that meets the physician face-to-face encounter requirements with this patient on 08/05/2020. The encounter with the patient was in whole, or in part for the following medical condition(s) which is the primary reason for home health care-COPD with home oxygen, debility, deconditioning, atrial fibrillation   The encounter with the patient was in whole, or in part, for the following medical condition, which is the primary reason for home health care: COPD on home oxygen, debility, deconditioning, atrial fibrillation   I certify that, based on my findings, the following services are medically necessary home health services: Physical therapy   Reason for Medically Necessary Home Health Services: Therapy- Therapeutic Exercises to Increase Strength and Endurance   My clinical findings support the need for the above services: Shortness of breath with  activity   Further, I certify that my clinical findings support that this patient is homebound due to: Shortness of Breath with activity   Home Health   Complete by: As directed    To provide the following care/treatments: PT   Increase activity slowly   Complete by: As directed    No wound care   Complete by: As directed      Allergies as of 08/05/2020   No Known Allergies     Medication List    STOP taking these medications   amLODipine 5 MG tablet Commonly known as: NORVASC     TAKE these medications   acetaminophen 325 MG tablet Commonly known as: TYLENOL Take 2 tablets (650 mg total) by mouth every 6 (six) hours as needed for mild pain (or Fever >/= 101).   albuterol 108 (90 Base) MCG/ACT inhaler Commonly known as: VENTOLIN HFA Inhale 2 puffs into the lungs every 6 (six) hours as needed for wheezing or shortness of breath.   Calcium 600 1500 (600 Ca) MG Tabs tablet Generic drug: calcium carbonate Take 1 tablet by mouth daily.   carvedilol 12.5 MG tablet Commonly known as: COREG Take 1 tablet (12.5 mg total) by mouth 2 (two) times daily with a meal. What changed:   medication strength  how much to take   cholecalciferol 25 MCG (1000 UNIT) tablet Commonly known as: VITAMIN D3 Take 1,000 Units by mouth daily.   citalopram 40 MG tablet Commonly known as: CELEXA Take 40 mg by mouth daily.   diltiazem 180 MG 24 hr capsule Commonly known as: CARDIZEM CD Take 1 capsule (180 mg total) by mouth daily. Start taking on: August 06, 2020   feeding supplement (NEPRO CARB STEADY) Liqd Take 237 mLs by mouth 3 (three) times daily between meals.   ferrous sulfate 325 (65 FE) MG tablet Take 1 tablet (325 mg total) by mouth 2 (two) times daily with a meal.   Fluticasone-Umeclidin-Vilant 100-62.5-25 MCG/INH Aepb Inhale 1 puff into the lungs daily.   multivitamin with minerals Tabs tablet Take 1 tablet by mouth daily.   pantoprazole 40 MG tablet  Commonly known as:  Protonix Take 1 tablet (40 mg total) by mouth 2 (two) times daily before a meal.   potassium phosphate (monobasic) 500 MG tablet Commonly known as: K-PHOS ORIGINAL Take 1 tablet (500 mg total) by mouth 2 (two) times daily with a meal for 5 days.   sodium bicarbonate 650 MG tablet Take 2 tablets (1,300 mg total) by mouth 3 (three) times daily.   sucralfate 1 GM/10ML suspension Commonly known as: CARAFATE Take 10 mLs (1 g total) by mouth 4 (four) times daily.   torsemide 20 MG tablet Commonly known as: DEMADEX Take 1 tablet (20 mg total) by mouth every other day. What changed:   when to take this  reasons to take this       Follow-up Information    Corey Skains, MD. Go on 08/11/2020.   Specialty: Cardiology Why: @ 8:30am Contact information: [facility] [place] [address] [place], [facility] [place]. Schedule an appointment as soon as possible for a visit on 08/12/2020.   Specialty: General Practice Why: regular followup.Marland KitchenMarland KitchenMarland Kitchen. 10:40am Contact information: [place] [address] [phone]                Time coordinating discharge: 39 minutes  Signed:  Malashia Kamaka  [facility] 08/05/2020, 12:57 PM

## 2020-08-05 NOTE — Progress Notes (Signed)
SATURATION QUALIFICATIONS: (This note is used to comply with regulatory documentation for home oxygen)  Patient Saturations on Room Air at Rest = 95  Patient Saturations on Room Air while Ambulating = 88  Patient Saturations on 0 Liters of oxygen while Ambulating = Room air   Please briefly explain why patient needs home oxygen: pt needs home air d/t sob while ambulating and doing daily adl with sat desaturation

## 2020-08-23 ENCOUNTER — Encounter: Payer: Self-pay | Admitting: Internal Medicine

## 2020-08-23 NOTE — Progress Notes (Signed)
Patient called/ screened for oncology follow-up appointment, expresses concerns of shortness of breath, numbness, headaches and loss of appetite.

## 2020-08-24 ENCOUNTER — Inpatient Hospital Stay (HOSPITAL_BASED_OUTPATIENT_CLINIC_OR_DEPARTMENT_OTHER): Payer: Medicare Other | Admitting: Internal Medicine

## 2020-08-24 ENCOUNTER — Encounter: Payer: Self-pay | Admitting: Internal Medicine

## 2020-08-24 ENCOUNTER — Inpatient Hospital Stay: Payer: Medicare Other | Attending: Internal Medicine

## 2020-08-24 ENCOUNTER — Inpatient Hospital Stay: Payer: Medicare Other

## 2020-08-24 DIAGNOSIS — N184 Chronic kidney disease, stage 4 (severe): Secondary | ICD-10-CM | POA: Diagnosis not present

## 2020-08-24 DIAGNOSIS — C155 Malignant neoplasm of lower third of esophagus: Secondary | ICD-10-CM

## 2020-08-24 DIAGNOSIS — D631 Anemia in chronic kidney disease: Secondary | ICD-10-CM | POA: Insufficient documentation

## 2020-08-24 DIAGNOSIS — R634 Abnormal weight loss: Secondary | ICD-10-CM | POA: Diagnosis not present

## 2020-08-24 DIAGNOSIS — Z8501 Personal history of malignant neoplasm of esophagus: Secondary | ICD-10-CM | POA: Insufficient documentation

## 2020-08-24 DIAGNOSIS — R630 Anorexia: Secondary | ICD-10-CM | POA: Insufficient documentation

## 2020-08-24 DIAGNOSIS — I129 Hypertensive chronic kidney disease with stage 1 through stage 4 chronic kidney disease, or unspecified chronic kidney disease: Secondary | ICD-10-CM | POA: Diagnosis not present

## 2020-08-24 DIAGNOSIS — Z79899 Other long term (current) drug therapy: Secondary | ICD-10-CM | POA: Insufficient documentation

## 2020-08-24 LAB — CBC WITH DIFFERENTIAL/PLATELET
Abs Immature Granulocytes: 0.01 10*3/uL (ref 0.00–0.07)
Basophils Absolute: 0.1 10*3/uL (ref 0.0–0.1)
Basophils Relative: 1 %
Eosinophils Absolute: 0.3 10*3/uL (ref 0.0–0.5)
Eosinophils Relative: 6 %
HCT: 28.8 % — ABNORMAL LOW (ref 36.0–46.0)
Hemoglobin: 9.7 g/dL — ABNORMAL LOW (ref 12.0–15.0)
Immature Granulocytes: 0 %
Lymphocytes Relative: 28 %
Lymphs Abs: 1.3 10*3/uL (ref 0.7–4.0)
MCH: 32.8 pg (ref 26.0–34.0)
MCHC: 33.7 g/dL (ref 30.0–36.0)
MCV: 97.3 fL (ref 80.0–100.0)
Monocytes Absolute: 0.4 10*3/uL (ref 0.1–1.0)
Monocytes Relative: 8 %
Neutro Abs: 2.6 10*3/uL (ref 1.7–7.7)
Neutrophils Relative %: 57 %
Platelets: 195 10*3/uL (ref 150–400)
RBC: 2.96 MIL/uL — ABNORMAL LOW (ref 3.87–5.11)
RDW: 13.4 % (ref 11.5–15.5)
Smear Review: NORMAL
WBC: 4.6 10*3/uL (ref 4.0–10.5)
nRBC: 0 % (ref 0.0–0.2)

## 2020-08-24 LAB — BASIC METABOLIC PANEL
Anion gap: 12 (ref 5–15)
BUN: 26 mg/dL — ABNORMAL HIGH (ref 8–23)
CO2: 24 mmol/L (ref 22–32)
Calcium: 9 mg/dL (ref 8.9–10.3)
Chloride: 104 mmol/L (ref 98–111)
Creatinine, Ser: 1.9 mg/dL — ABNORMAL HIGH (ref 0.44–1.00)
GFR, Estimated: 29 mL/min — ABNORMAL LOW (ref >60)
Glucose, Bld: 84 mg/dL (ref 70–99)
Potassium: 3.7 mmol/L (ref 3.5–5.1)
Sodium: 140 mmol/L (ref 135–145)

## 2020-08-24 MED ORDER — EPOETIN ALFA-EPBX 10000 UNIT/ML IJ SOLN
20000.0000 [IU] | Freq: Once | INTRAMUSCULAR | Status: AC
  Administered 2020-08-24: 20000 [IU] via SUBCUTANEOUS
  Filled 2020-08-24: qty 2

## 2020-08-24 NOTE — Assessment & Plan Note (Addendum)
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  Currently s/p definitive radiation [dec 7th 2020].  STABLE;   Posttreatment PET scan February 2021-no evidence of any metastatic disease.  No uptake in the esophagus region. EGD July 15 th- NO evidence of recurrence. STABLE.  Continue surveillance.  #Severe anemia-likely secondary to CKD stage IV-currently on Retacrit; hemoglobin today is 9.3 PROCEED with Retacrit today. STABLE.    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  STABLE;  Await MM labs from today.  #Weight loss/poor appetite- STABLE  # DISPOSITION:  #  RETACRIT today # in 4 weeks- H&H- possible retacrit # in 8 weeks- H&H- possible retacrit # follow up in 12 weeks- cbc/bmp MM panel; K/L light chains-; possible Retacrit;-Dr.B

## 2020-08-24 NOTE — Progress Notes (Signed)
[facility] OFFICE PROGRESS NOTE  Patient Care Team: [facility], [place] as PCP - General ([place]) [name], FNP as Nurse Practitioner (Family Medicine) [name], NP as Nurse Practitioner (Nurse Practitioner) [name], RN as Registered Nurse [name], RN as Registered Nurse [name], RN as Oncology Nurse Navigator [name], MD as Consulting Physician (Hematology and Oncology)  Cancer Staging Breast cancer in female [facility]) Staging form: Breast, AJCC 7th Edition - Clinical: Stage IIIA (T3, N1, M0) - Signed by [name], NP on 02/21/2016    Oncology History Overview Note  # 2016- JAN RUL 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 A. LUNG MASS, RIGHT; CT-GUIDED BIOPSY: - ADENOCARCINOMA, ACINAR AND PAPILLARY MORPHOLOGIES. S/p RT [finished Dec 2017] s/p RT [DEC 2017]; AUG 2019- PET-right middle lobe radiation changes no evidence of recurrence.  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  T1b- submucosal involvement; N0- stage I [[name]; [facility]]; unable to proceed with Endo mucosal resection-because of extensive disease [Dr.Branch; [facility]]; recommend definitive radiation [finished  RT- 09/21/2019]   #September 2020 worsening macrocytic anemia--bone marrow biopsy shows plasma cells up to 5%; unlikely to explain patient's anemia.  MDS is still a possibility-if anemia worsens would recommend peripheral blood NGS.  Jan 2021 -anemia likely secondary to worsening CKD-Retacrit  # 2000-  Left breast cancer [T3N1- stage III] mastecomy s/p RT; s/p chemo; Tamoxifen  # NOV 111 2017- Mol testing  [RML- adeno ca]  # 2019-July-aug 2019-ischemic colitis status post right hemicolectomy [Dr.[name]]; acute renal failure;  -----------------------------------------------------------------    DIAGNOSIS: RML Lung ca adeno [stage I] #squamous cell carcinoma esophagus -stage I   CURRENT/MOST RECENT THERAPY: Status post radiation       Breast cancer in female [facility])  07/24/1999 Initial Diagnosis   Breast cancer in female ([place]) T3 N1 M0 tumor ER/PR positive   08/24/1999 -  Anti-estrogen oral therapy   Started tamoxifen   08/24/1999 Surgery   Status post modified radical mastectomy of left breast    Chemotherapy      Radiation Therapy     11/24/2003 - 02/20/2009 Anti-estrogen oral therapy   Started Aromasin   Carcinoma of overlapping sites of left breast in female, estrogen receptor positive ([place])  06/22/2016 Initial Diagnosis   Cancer of overlapping sites of left female breast ([place])   Malignant neoplasm of upper lobe of right lung ([place])  Malignant neoplasm of lower third of esophagus ([place])  05/01/2019 Initial Diagnosis   Malignant neoplasm of lower third of esophagus ([place])     INTERVAL HISTORY:  Danielle [name] 64 y.o.  female pleasant patient above history of multiple malignancies including most recent stage I squamous cell cancer of esophagus currently status post radiation; and history of severe anemia is here for follow-up.  In the interim patient has not had any hospitalizations.  No further falls.  Denies any difficulty swallowing pain with swallowing.  No nausea no vomiting.  Chronic shortness of breath chronic cough not any worse.   Review of Systems  Constitutional: Positive for malaise/fatigue and weight loss. Negative for chills, diaphoresis and fever.  HENT: Negative for nosebleeds and sore throat.   Eyes: Negative  for double vision.  Respiratory: Positive for cough and shortness of breath. Negative for hemoptysis, sputum production and wheezing.   Cardiovascular:  Negative for chest pain, palpitations, orthopnea and leg swelling.  Gastrointestinal: Negative for abdominal pain, blood in stool, constipation, heartburn, melena and vomiting.  Genitourinary: Negative for dysuria, frequency and urgency.  Musculoskeletal: Negative for back pain and joint pain.  Skin: Negative.  Negative for itching and rash.  Neurological: Negative for tingling, focal weakness, weakness and headaches.  Endo/Heme/Allergies: Does not bruise/bleed easily.  Psychiatric/Behavioral: Negative for depression. The patient is not nervous/anxious and does not have insomnia.       PAST MEDICAL HISTORY :  Past Medical History:  Diagnosis Date  . Anxiety   . Arthritis   . Breast cancer ([place]) 2000  . Cancer ([place]) left    breast cancer 2000, chemo tx's with total mastectomy and lymph nodes resected.   . Cancer of right lung ([place]) 02/21/2016   rad tx's.   . CHF (congestive heart failure) ([place])   . COPD (chronic obstructive pulmonary disease) ([place])   . Dependence on supplemental oxygen   . Depression   . Heart murmur   . Hypertension   . Lung nodule   . Lymphedema   . Personal history of chemotherapy   . Personal history of radiation therapy   . Shortness of breath dyspnea    with exertion  . Status post chemotherapy 2001   left breast cancer  . Status post radiation therapy 2001   left breast cancer    PAST SURGICAL HISTORY :   Past Surgical History:  Procedure Laterality Date  . Breast Biospy Left    [facility]  . BREAST SURGERY    . COLONOSCOPY N/A 04/30/2018   Procedure: COLONOSCOPY;  Surgeon: Virgel Manifold, MD;  Location: [facility] ENDOSCOPY;  Service: Endoscopy;  Laterality: N/A;  . COLONOSCOPY N/A 07/22/2018   Procedure: COLONOSCOPY;  Surgeon: Virgel Manifold, MD;  Location: [facility] ENDOSCOPY;  Service: Endoscopy;  Laterality: N/A;  . DILATION AND CURETTAGE OF UTERUS    . ELECTROMAGNETIC NAVIGATION BROCHOSCOPY Right 04/11/2016   Procedure: ELECTROMAGNETIC NAVIGATION  BRONCHOSCOPY;  Surgeon: Vilinda Boehringer, MD;  Location: [facility] ORS;  Service: Cardiopulmonary;  Laterality: Right;  . ESOPHAGOGASTRODUODENOSCOPY N/A 07/22/2018   Procedure: ESOPHAGOGASTRODUODENOSCOPY (EGD);  Surgeon: Virgel Manifold, MD;  Location: [facility] ENDOSCOPY;  Service: Endoscopy;  Laterality: N/A;  . ESOPHAGOGASTRODUODENOSCOPY (EGD) WITH PROPOFOL N/A 05/07/2018   Procedure: ESOPHAGOGASTRODUODENOSCOPY (EGD) WITH PROPOFOL;  Surgeon: Lucilla Lame, MD;  Location: [facility] ENDOSCOPY;  Service: Endoscopy;  Laterality: N/A;  . ESOPHAGOGASTRODUODENOSCOPY (EGD) WITH PROPOFOL N/A 04/24/2019   Procedure: ESOPHAGOGASTRODUODENOSCOPY (EGD) WITH PROPOFOL;  Surgeon: Jonathon Bellows, MD;  Location[facility] ENDOSCOPY;  Service: Gastroenterology;  Laterality: N/A;  . ESOPHAGOGASTRODUODENOSCOPY (EGD) WITH PROPOFOL N/A 01/12/2020   Procedure: ESOPHAGOGASTRODUODENOSCOPY (EGD) WITH PROPOFOL;  Surgeon: Jonathon Bellows, MD;  Location: [facility] END[facility]COPY;  Service: Gastroenterology;  Laterality: N/A;  . ESOPHAGOGASTRODUODENOSCOPY (EGD) WITH PROPOFOL N/A 04/28/2020   Procedure: ESOPHAGOGASTRODUODENOSCOPY (EGD) WITH PROPOFOL;  Surgeon: Jonathon Bellows, MD;  Location: [facility] ENDOSCOPY;  Service: Gastroenterology;  Laterality: N/A;  . EUS N/A 05/07/2019   Procedure: FULL UPPER ENDOSCOPIC ULTRASOUND (EUS) RADIAL;  Surgeon: Jola Schmidt, MD;  Location: [facility] ENDOSCOPY;  Service: Endoscopy;  Laterality: N/A;  . history of colonoscopy]    . ILEOSCOPY N/A 07/22/2018   Procedure: ILEOSCOPY THROUGH STOMA;  Surgeon: Virgel Manifold, MD;  Location: [facility] ENDOSCOPY;  Service: Endoscopy;  Laterality: N/A;  . ILEOSTOMY    . ILEOSTOMY N/A  09/08/2018   Procedure: ILEOSTOMY REVISION POSSIBLE CREATION;  Surgeon: Herbert Pun, MD;  Location: [facility] ORS;  Service: General;  Laterality: N/A;  . ILEOSTOMY CLOSURE N/A 08/15/2018   Procedure: DILATION OF ILEOSTOMY STRICTURE;  Surgeon: Herbert Pun, MD;  Location: [facility] ORS;  Service: General;  Laterality:  N/A;  . LAPAROTOMY Right 05/04/2018   Procedure: EXPLORATORY LAPAROTOMY right colectomy right and left ostomy;  Surgeon: Herbert Pun, MD;  Location: [facility] ORS;  Service: General;  Laterality: Right;  . LUNG BIOPSY    . MASTECTOMY Left    2000, [facility]  . ROTATOR CUFF REPAIR Right    [facility]    FAMILY HISTORY :   Family History  Problem Relation Age of Onset  . Breast cancer Mother 28  . Cancer Mother        Breast   . Cirrhosis Father   . Breast cancer Paternal Aunt 33  . Cancer Maternal Aunt        Breast     SOCIAL HISTORY:   Social History   Tobacco Use  . Smoking status: Former Smoker    Packs/day: 0.50    Years: 20.00    Pack years: 10.00    Types: Cigarettes    Quit date: 07/02/2012    Years since quitting: 8.1  . Smokeless tobacco: Current User    Types: Snuff  . Tobacco comment: quit 2014  Vaping Use  . Vaping Use: Never used  Substance Use Topics  . Alcohol use: Not Currently    Comment: Occasionally  . Drug use: No    ALLERGIES:  has No Known Allergies.  MEDICATIONS:  Current Outpatient Medications  Medication Sig Dispense Refill  . acetaminophen (TYLENOL) 325 MG tablet Take 2 tablets (650 mg total) by mouth every 6 (six) hours as needed for mild pain (or Fever >/= 101).    [name] albuterol (PROVENTIL HFA;VENTOLIN HFA) 108 (90 Base) MCG/ACT inhaler Inhale 2 puffs into the lungs every 6 (six) hours as needed for wheezing or shortness of breath. 1 Inhaler 2  . calcium carbonate (CALCIUM 600) 1500 (600 Ca) MG TABS tablet Take 1 tablet by mouth daily.    . carvedilol (COREG) 12.5 MG tablet Take 1 tablet (12.5 mg total) by mouth 2 (two) times daily with a meal. 60 tablet 2  . cholecalciferol (VITAMIN D3) 25 MCG (1000 UNIT) tablet Take 1,000 Units by mouth daily.    . citalopram (CELEXA) 40 MG tablet Take 40 mg by mouth daily.     [name] diltiazem (CARDIZEM CD) 180 MG 24 hr capsule Take 1 capsule (180 mg total) by mouth daily. 30 capsule 2  . ferrous sulfate 325 (65  FE) MG tablet Take 1 tablet (325 mg total) by mouth 2 (two) times daily with a meal. 60 tablet 3  . Fluticasone-Umeclidin-Vilant 100-62.5-25 MCG/INH AEPB Inhale 1 puff into the lungs daily.     . Multiple Vitamin (MULTIVITAMIN WITH MINERALS) TABS tablet Take 1 tablet by mouth daily. 30 tablet 1  . Nutritional Supplements (FEEDING SUPPLEMENT, NEPRO CARB STEADY,) LIQD Take 237 mLs by mouth 3 (three) times daily between meals. 21330 mL 0  . pantoprazole (PROTONIX) 40 MG tablet Take 1 tablet (40 mg total) by mouth 2 (two) times daily before a meal. 60 tablet 2  . potassium chloride (KLOR-CON) 10 MEQ tablet Take by mouth.    . sodium bicarbonate 650 MG tablet Take 2 tablets (1,300 mg total) by mouth 3 (three) times daily. 180 tablet 0  . sucralfate (  CARAFATE) 1 GM/10ML suspension Take 10 mLs (1 g total) by mouth 4 (four) times daily. 1200 mL 2  . torsemide (DEMADEX) 20 MG tablet Take 1 tablet (20 mg total) by mouth every other day. 30 tablet 1   No current facility-administered medications for this visit.   Facility-Administered Medications Ordered in Other Visits  Medication Dose Route Frequency Provider Last Rate Last Admin  . epoetin alfa-epbx (RETACRIT) injection 20,000 Units  20,000 Units Subcutaneous Once [name], MD        PHYSICAL EXAMINATION: ECOG PERFORMANCE STATUS: 1 - Symptomatic but completely ambulatory  BP 137/88 (BP Location: Right Arm, Patient Position: Sitting, Cuff Size: Normal)   Pulse 69   Temp (!) 97 F (36.1 C) (Tympanic)   Resp 16   Ht $R'5\' 5"'Rr$  (1.651 m)   Wt 126 lb (57.2 kg)   SpO2 93%   BMI 20.97 kg/m   Filed Weights   08/23/20 1540 08/24/20 0956  Weight: 126 lb (57.2 kg) 126 lb (57.2 kg)    Physical Exam Constitutional:      Comments: Thin built moderately nourished female patient.  She is walking herself.  She is alone.  HENT:     Head: Normocephalic and atraumatic.     Mouth/Throat:     Pharynx: No oropharyngeal exudate.  Eyes:      Pupils: Pupils are equal, round, and reactive to light.  Cardiovascular:     Rate and Rhythm: Normal rate and regular rhythm.  Pulmonary:     Effort: No respiratory distress.     Breath sounds: No wheezing.  Abdominal:     General: Bowel sounds are normal. There is no distension.     Palpations: Abdomen is soft. There is no mass.     Tenderness: There is no abdominal tenderness. There is no guarding or rebound.  Musculoskeletal:        General: No tenderness. Normal range of motion.     Cervical back: Normal range of motion and neck supple.     Comments: Left upper extremity-swollen/no warmth or tenderness.   Skin:    General: Skin is warm.  Neurological:     Mental Status: She is alert and oriented to person, place, and time.  Psychiatric:        Mood and Affect: Affect normal.        LABORATORY DATA:  I have reviewed the data as listed    Component Value Date/Time   NA 140 08/24/2020 0939   NA 132 (L) 02/09/2015 1100   K 3.7 08/24/2020 0939   K 3.8 02/09/2015 1100   CL 104 08/24/2020 0939   CL 95 (L) 02/09/2015 1100   CO2 24 08/24/2020 0939   CO2 29 02/09/2015 1100   GLUCOSE 84 08/24/2020 0939   GLUCOSE 105 (H) 02/09/2015 1100   BUN 26 (H) 08/24/2020 0939   BUN 16 02/09/2015 1100   CREATININE 1.90 (H) 08/24/2020 0939   CREATININE 0.81 02/09/2015 1100   CALCIUM 9.0 08/24/2020 0939   CALCIUM 9.1 02/09/2015 1100   PROT 7.3 01/20/2020 0502   PROT 7.7 02/09/2015 1100   ALBUMIN 3.6 01/20/2020 0502   ALBUMIN 4.3 02/09/2015 1100   AST 40 01/20/2020 0502   AST 29 02/09/2015 1100   ALT 22 01/20/2020 0502   ALT 20 02/09/2015 1100   ALKPHOS 67 01/20/2020 0502   ALKPHOS 69 02/09/2015 1100   BILITOT 1.2 01/20/2020 0502   BILITOT 0.9 02/09/2015 1100   GFRNONAA 29 (L) 08/24/2020  [place] >60 02/09/2015 1100   GFRAA 37 (L) 06/29/2020 0930   GFRAA >60 02/09/2015 1100    No results found for: SPEP, UPEP  Lab Results  Component Value Date   WBC 4.6 08/24/2020    NEUTROABS PENDING 08/24/2020   HGB 9.7 (L) 08/24/2020   HCT 28.8 (L) 08/24/2020   MCV 97.3 08/24/2020   PLT 195 08/24/2020      Chemistry      Component Value Date/Time   NA 140 08/24/2020 0939   NA 132 (L) 02/09/2015 1100   K 3.7 08/24/2020 0939   K 3.8 02/09/2015 1100   CL 104 08/24/2020 0939   CL 95 (L) 02/09/2015 1100   CO2 24 08/24/2020 0939   CO2 29 02/09/2015 1100   BUN 26 (H) 08/24/2020 0939   BUN 16 02/09/2015 1100   CREATININE 1.90 (H) 08/24/2020 0939   CREATININE 0.81 02/09/2015 1100      Component Value Date/Time   CALCIUM 9.0 08/24/2020 0939   CALCIUM 9.1 02/09/2015 1100   ALKPHOS 67 01/20/2020 0502   ALKPHOS 69 02/09/2015 1100   AST 40 01/20/2020 0502   AST 29 02/09/2015 1100   ALT 22 01/20/2020 0502   ALT 20 02/09/2015 1100   BILITOT 1.2 01/20/2020 0502   BILITOT 0.9 02/09/2015 1100       RADIOGRAPHIC STUDIES: I have personally reviewed the radiological images as listed and agreed with the findings in the report. No results found.   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  Currently s/p definitive radiation [dec 7th 2020].  STABLE;   Posttreatment PET scan February 2021-no evidence of any metastatic disease.  No uptake in the esophagus region. EGD July 15 th- NO evidence of recurrence. STABLE.  Continue surveillance.  #Severe anemia-likely secondary to CKD stage IV-currently on Retacrit; hemoglobin today is 9.3 PROCEED with Retacrit today. STABLE.    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  STABLE;  Await MM labs from today.  #Weight loss/poor appetite- STABLE  # DISPOSITION:  #  RETACRIT today # in 4 weeks- H&H- possible retacrit # in 8 weeks- H&H- possible  retacrit # follow up in 12 weeks- cbc/bmp MM panel; K/L light chains-; possible Retacrit;-Dr.B     No orders of the defined types were placed in this encounter.  All questions were answered. The patient knows to call the clinic with any problems, questions or concerns.      [name], MD 08/24/2020 10:40 AM

## 2020-08-25 LAB — MULTIPLE MYELOMA PANEL, SERUM
Albumin SerPl Elph-Mcnc: 3.5 g/dL (ref 2.9–4.4)
Albumin/Glob SerPl: 1.1 (ref 0.7–1.7)
Alpha 1: 0.2 g/dL (ref 0.0–0.4)
Alpha2 Glob SerPl Elph-Mcnc: 0.9 g/dL (ref 0.4–1.0)
B-Globulin SerPl Elph-Mcnc: 0.9 g/dL (ref 0.7–1.3)
Gamma Glob SerPl Elph-Mcnc: 1.2 g/dL (ref 0.4–1.8)
Globulin, Total: 3.2 g/dL (ref 2.2–3.9)
IgA: 160 mg/dL (ref 87–352)
IgG (Immunoglobin G), Serum: 1212 mg/dL (ref 586–1602)
IgM (Immunoglobulin M), Srm: 29 mg/dL (ref 26–217)
M Protein SerPl Elph-Mcnc: 0.6 g/dL — ABNORMAL HIGH
Total Protein ELP: 6.7 g/dL (ref 6.0–8.5)

## 2020-08-25 LAB — KAPPA/LAMBDA LIGHT CHAINS
Kappa free light chain: 43.9 mg/L — ABNORMAL HIGH (ref 3.3–19.4)
Kappa, lambda light chain ratio: 0.46 (ref 0.26–1.65)
Lambda free light chains: 95.9 mg/L — ABNORMAL HIGH (ref 5.7–26.3)

## 2020-09-06 ENCOUNTER — Other Ambulatory Visit: Payer: Self-pay

## 2020-09-06 ENCOUNTER — Ambulatory Visit
Admission: RE | Admit: 2020-09-06 | Discharge: 2020-09-06 | Disposition: A | Discharge status: Home / Self Care | Payer: Medicare Other | Source: Ambulatory Visit | Attending: Family Medicine | Admitting: Family Medicine

## 2020-09-06 DIAGNOSIS — Z1231 Encounter for screening mammogram for malignant neoplasm of breast: Secondary | ICD-10-CM | POA: Insufficient documentation

## 2020-09-13 IMAGING — CT CT ABDOMEN AND PELVIS WITH CONTRAST
1 series · 14 of 32 positions shown, 18 images · IV contrast (omnipaque)
Comparison: CT chest, 11/19/2018, CT abdomen pelvis, 09/05/2018

CLINICAL DATA: Trouble swallowing, suspected esophageal malignancy
on upper endoscopy. History of left breast cancer, lung cancer,
colostomy

EXAM:
CT CHEST, ABDOMEN, AND PELVIS WITH CONTRAST
TECHNIQUE: Multidetector CT imaging of the chest, abdomen and pelvis was
performed following the standard protocol during bolus
administration of intravenous contrast.
CONTRAST:  100mL OMNIPAQUE IOHEXOL 300 MG/ML SOLN, additional oral
enteric contrast

[Series 7: 3-4 mins · axial · 0.64mm/px · z∈[-758,-578]mm · 14 of 41 slices shown, 18 images]
[im 3/41  soft-tissue]
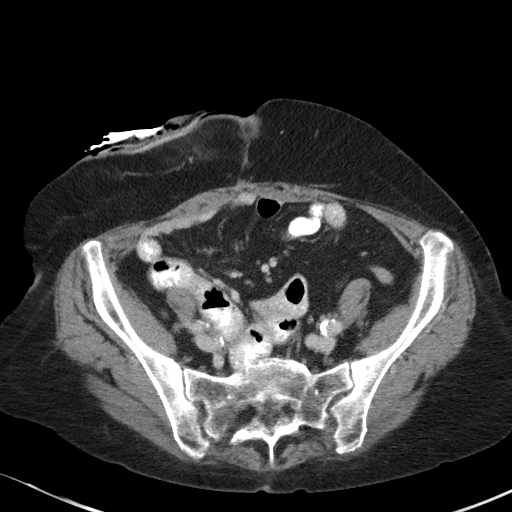
[im 3/41  bone]
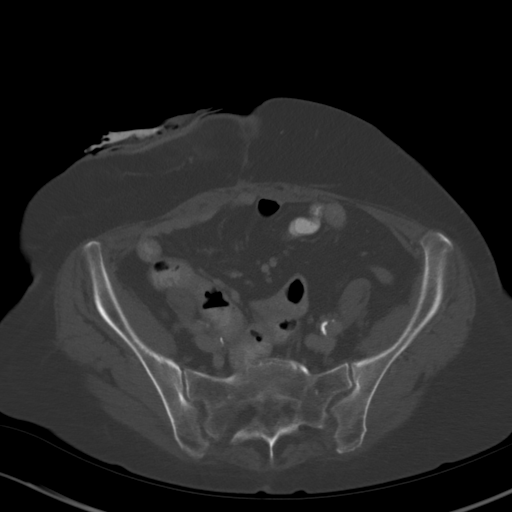
[im 6/41  soft-tissue]
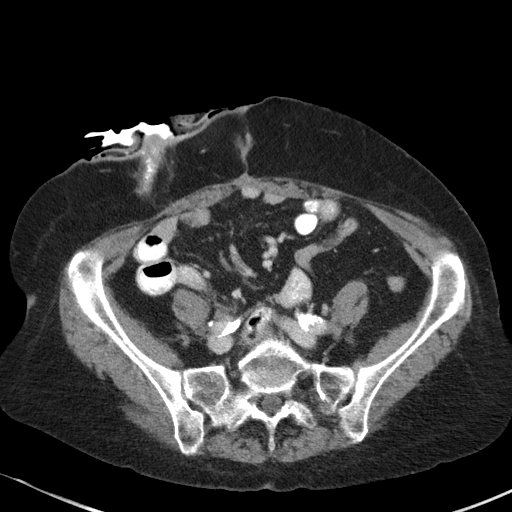
[im 10/41  soft-tissue]
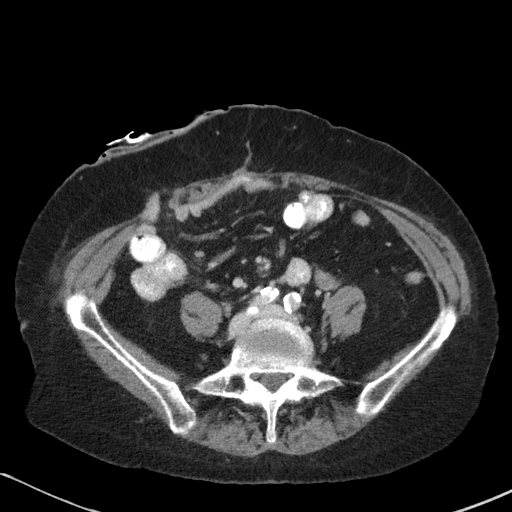
[im 12/41  soft-tissue]
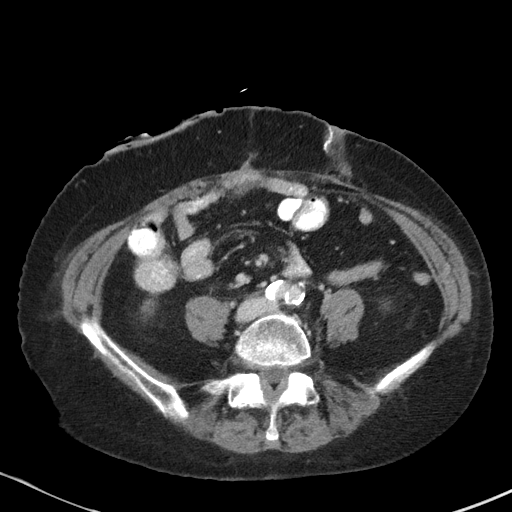
[im 16/41  soft-tissue]
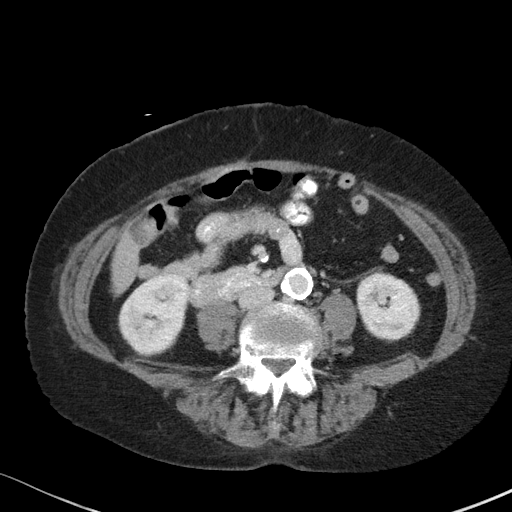
[im 19/41  soft-tissue]
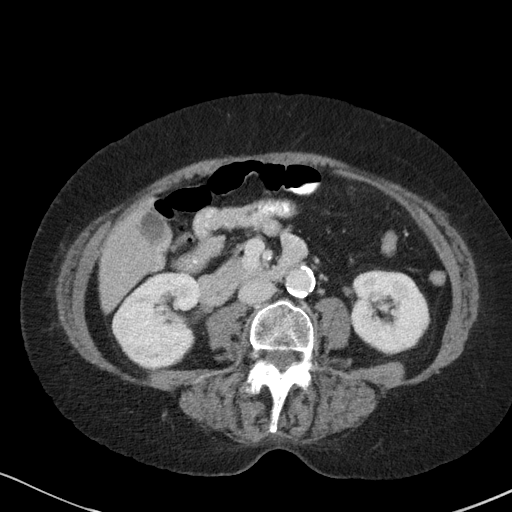
[im 22/41  soft-tissue]
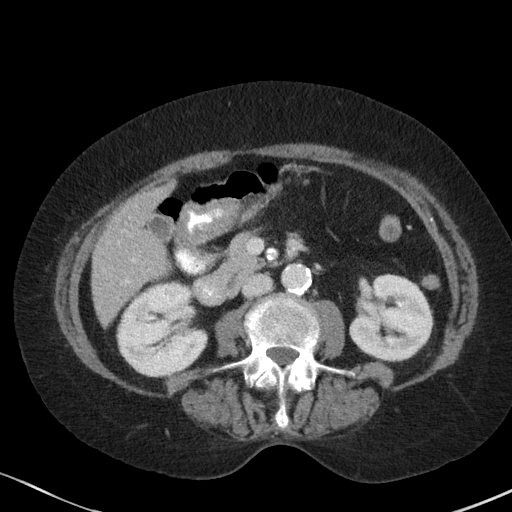
[im 25/41  soft-tissue]
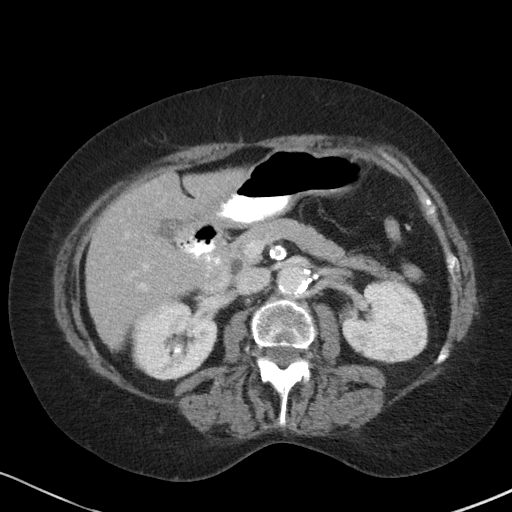
[im 29/41  soft-tissue]
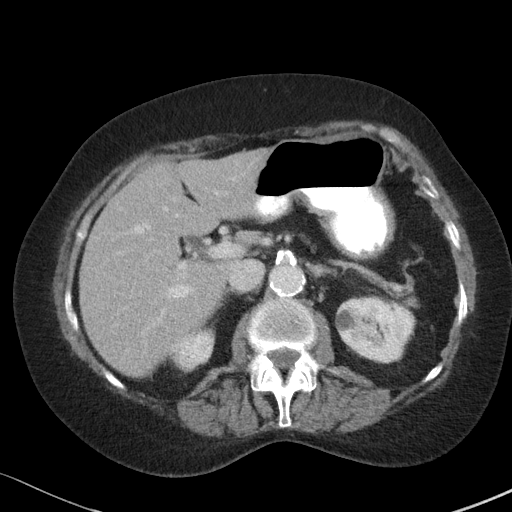
[im 29/41  bone]
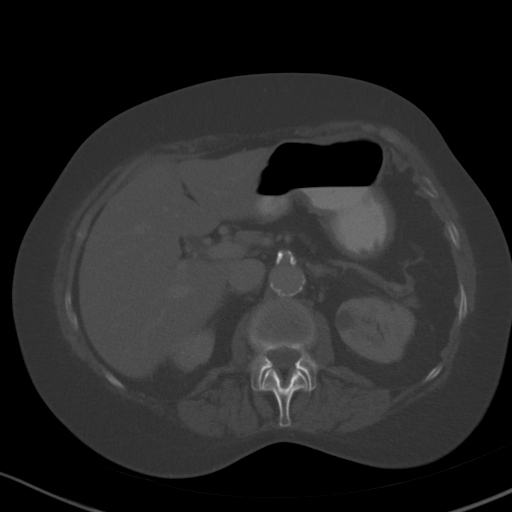
[im 31/41  soft-tissue]
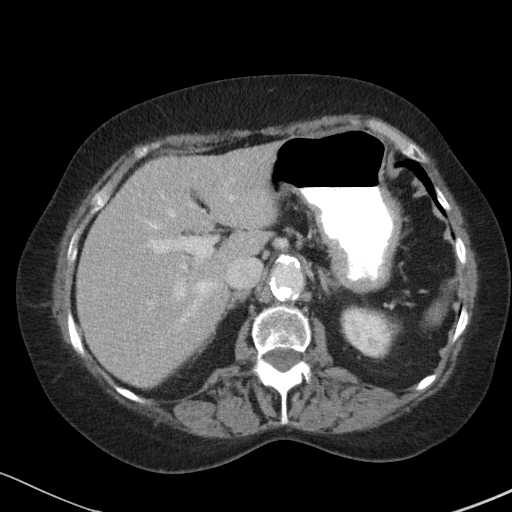
[im 35/41  soft-tissue]
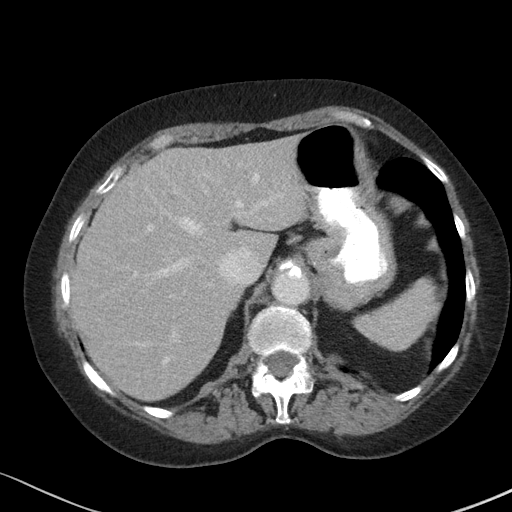
[im 35/41  lung]
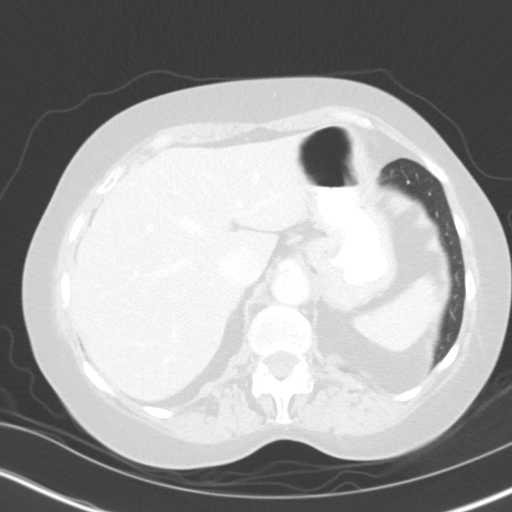
[im 37/41  lung]
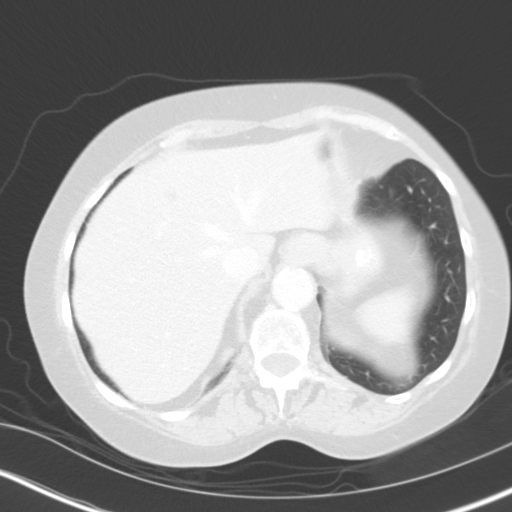
[im 38/41  soft-tissue]
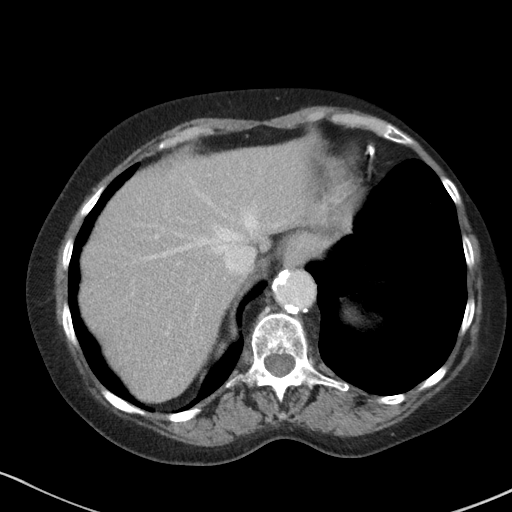
[im 38/41  lung]
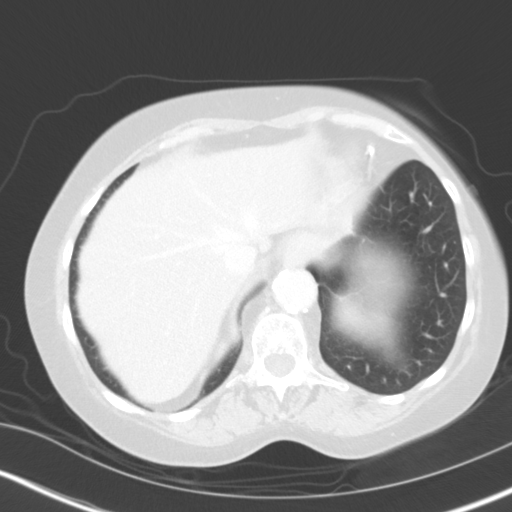
[im 39/41  lung]
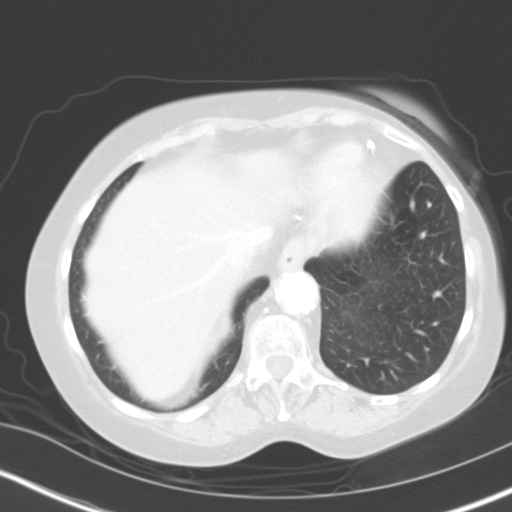

[14 of 32 positions shown; findings below may reference images not displayed]

FINDINGS: CT CHEST FINDINGS

Cardiovascular: Severe aortic atherosclerosis. Extensive 3 vessel
coronary artery calcifications. Normal heart size. No pericardial
effusion.

Mediastinum/Nodes: No enlarged mediastinal, hilar, or axillary lymph
nodes. Unchanged subcentimeter nodule of the thyroid isthmus.
Trachea and esophagus demonstrate no significant findings.

Lungs/Pleura: Moderate centrilobular emphysema. Unchanged,
spiculated post treatment appearance of a right upper lobe mass
measuring 3.2 x 2.5 cm (series 4, image 54). Redemonstrated post
radiation fibrosis of the anterior left upper lobe. No pleural
effusion or pneumothorax.

Musculoskeletal: No chest wall mass or suspicious bone lesions
identified. Status post left mastectomy.

CT ABDOMEN PELVIS FINDINGS

Hepatobiliary: No solid liver abnormality is seen. Unchanged
low-attenuation subcentimeter lesion of the right lobe of the liver,
incompletely characterized although likely a small cyst or
hemangioma (series 2, image 65). Additional simple cyst. No
gallstones, gallbladder wall thickening, or biliary dilatation.

Pancreas: Unremarkable. No pancreatic ductal dilatation or
surrounding inflammatory changes.

Spleen: Normal in size without significant abnormality.

Adrenals/Urinary Tract: Adrenal glands are unremarkable. Kidneys are
normal, without renal calculi, solid lesion, or hydronephrosis.
Thickening of the urinary bladder.

Stomach/Bowel: Stomach is within normal limits. Evidence of prior
colon resection with a right lower quadrant end ostomy and left
hemiabdomen mucous fistula. Diverticulosis of the colonic remnant.

Vascular/Lymphatic: Severe aortic atherosclerosis. No enlarged
abdominal or pelvic lymph nodes.

Reproductive: No mass or other abnormality.

Other: No abdominal wall hernia or abnormality. No abdominopelvic
ascites.

Musculoskeletal: No acute or significant osseous findings.
IMPRESSION: 1. No CT abnormality of the distal esophagus to correspond to
reported endoscopic findings of squamous cell carcinoma. No masses,
thickening, or mediastinal lymphadenopathy is noted.

2. Unchanged, spiculated post treatment appearance of a right upper
lobe mass measuring 3.2 x 2.5 cm (series 4, image 54).
Redemonstrated post radiation fibrosis of the anterior left upper
lobe.

3.  Status post left mastectomy.

4. No evidence of metastatic disease or lymphadenopathy in the
chest, abdomen, or pelvis.

5. Evidence of prior colon resection with a right lower quadrant end
ostomy and left hemiabdomen mucous fistula. Diverticulosis of the
colonic remnant.

6. Nonspecific thickening of the urinary bladder, which may reflect
infectious or inflammatory cystitis. Correlate with urinalysis.

7.  Severe aortic atherosclerosis and coronary artery disease.

## 2020-09-21 ENCOUNTER — Other Ambulatory Visit: Payer: Self-pay

## 2020-09-21 ENCOUNTER — Other Ambulatory Visit: Payer: Self-pay | Admitting: *Deleted

## 2020-09-21 ENCOUNTER — Inpatient Hospital Stay: Payer: Medicare Other

## 2020-09-21 ENCOUNTER — Inpatient Hospital Stay: Payer: Medicare Other | Attending: Internal Medicine

## 2020-09-21 DIAGNOSIS — N184 Chronic kidney disease, stage 4 (severe): Secondary | ICD-10-CM | POA: Diagnosis not present

## 2020-09-21 DIAGNOSIS — D631 Anemia in chronic kidney disease: Secondary | ICD-10-CM | POA: Diagnosis not present

## 2020-09-21 DIAGNOSIS — I129 Hypertensive chronic kidney disease with stage 1 through stage 4 chronic kidney disease, or unspecified chronic kidney disease: Secondary | ICD-10-CM | POA: Diagnosis present

## 2020-09-21 LAB — HEMATOCRIT: HCT: 35.5 % — ABNORMAL LOW (ref 36.0–46.0)

## 2020-09-21 LAB — HEMOGLOBIN: Hemoglobin: 11.5 g/dL — ABNORMAL LOW (ref 12.0–15.0)

## 2020-10-19 ENCOUNTER — Inpatient Hospital Stay: Payer: Medicare Other

## 2020-10-19 ENCOUNTER — Other Ambulatory Visit: Payer: Self-pay

## 2020-10-19 ENCOUNTER — Inpatient Hospital Stay: Payer: Medicare Other | Attending: Internal Medicine

## 2020-10-19 DIAGNOSIS — I129 Hypertensive chronic kidney disease with stage 1 through stage 4 chronic kidney disease, or unspecified chronic kidney disease: Secondary | ICD-10-CM | POA: Diagnosis present

## 2020-10-19 DIAGNOSIS — D631 Anemia in chronic kidney disease: Secondary | ICD-10-CM | POA: Insufficient documentation

## 2020-10-19 DIAGNOSIS — N184 Chronic kidney disease, stage 4 (severe): Secondary | ICD-10-CM | POA: Diagnosis not present

## 2020-10-19 LAB — HEMOGLOBIN: Hemoglobin: 10.7 g/dL — ABNORMAL LOW (ref 12.0–15.0)

## 2020-10-19 LAB — HEMATOCRIT: HCT: 30.8 % — ABNORMAL LOW (ref 36.0–46.0)

## 2020-11-03 IMAGING — DX DG CHEST 1V
1 series · 1 of 1 positions shown · non-contrast
Comparison: Chest x-ray dated 12/22/2018

CLINICAL DATA: Shortness of breath. Hypotension.

EXAM:
CHEST  1 VIEW

[chest ap]
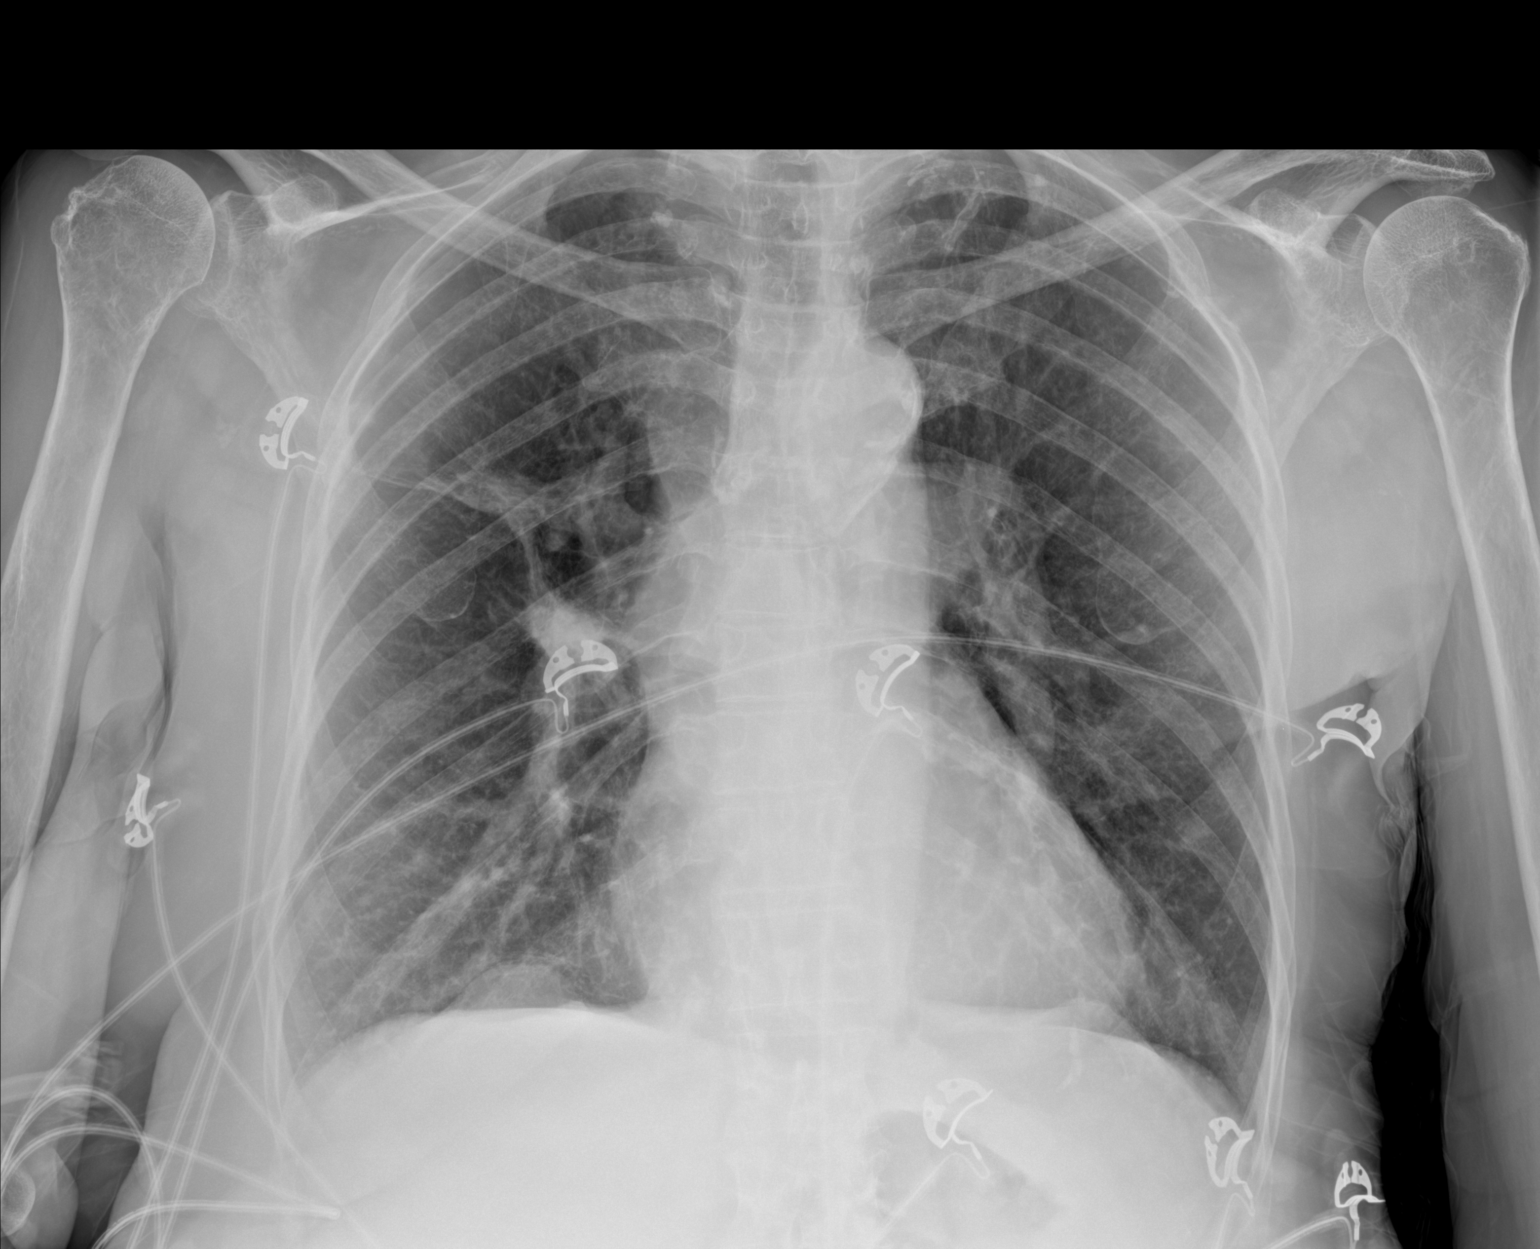

[1 of 1 positions shown; findings below may reference images not displayed]

FINDINGS: The heart size and pulmonary vascularity are normal. Scarring in the
right midzone, unchanged. No infiltrates or effusions.

Aortic atherosclerosis. No acute bone abnormality.
IMPRESSION: No acute cardiopulmonary disease. Scarring in the right midzone.
Aortic atherosclerosis.

## 2020-11-05 ENCOUNTER — Other Ambulatory Visit: Payer: Self-pay

## 2020-11-05 ENCOUNTER — Emergency Department: Payer: Medicare Other

## 2020-11-05 ENCOUNTER — Inpatient Hospital Stay
Admission: EM | Admit: 2020-11-05 | Discharge: 2020-11-10 | DRG: 389 | Disposition: A | Discharge status: Home / Self Care | Payer: Medicare Other | Attending: General Surgery | Admitting: General Surgery

## 2020-11-05 ENCOUNTER — Encounter: Payer: Self-pay | Admitting: Emergency Medicine

## 2020-11-05 DIAGNOSIS — F17290 Nicotine dependence, other tobacco product, uncomplicated: Secondary | ICD-10-CM | POA: Diagnosis present

## 2020-11-05 DIAGNOSIS — N189 Chronic kidney disease, unspecified: Secondary | ICD-10-CM

## 2020-11-05 DIAGNOSIS — F419 Anxiety disorder, unspecified: Secondary | ICD-10-CM | POA: Diagnosis present

## 2020-11-05 DIAGNOSIS — Z853 Personal history of malignant neoplasm of breast: Secondary | ICD-10-CM | POA: Diagnosis not present

## 2020-11-05 DIAGNOSIS — Z7901 Long term (current) use of anticoagulants: Secondary | ICD-10-CM | POA: Diagnosis not present

## 2020-11-05 DIAGNOSIS — Z803 Family history of malignant neoplasm of breast: Secondary | ICD-10-CM | POA: Diagnosis not present

## 2020-11-05 DIAGNOSIS — Z9221 Personal history of antineoplastic chemotherapy: Secondary | ICD-10-CM | POA: Diagnosis not present

## 2020-11-05 DIAGNOSIS — F32A Depression, unspecified: Secondary | ICD-10-CM | POA: Diagnosis present

## 2020-11-05 DIAGNOSIS — R198 Other specified symptoms and signs involving the digestive system and abdomen: Secondary | ICD-10-CM

## 2020-11-05 DIAGNOSIS — Z85118 Personal history of other malignant neoplasm of bronchus and lung: Secondary | ICD-10-CM | POA: Diagnosis not present

## 2020-11-05 DIAGNOSIS — Z923 Personal history of irradiation: Secondary | ICD-10-CM

## 2020-11-05 DIAGNOSIS — R011 Cardiac murmur, unspecified: Secondary | ICD-10-CM | POA: Diagnosis present

## 2020-11-05 DIAGNOSIS — Z9049 Acquired absence of other specified parts of digestive tract: Secondary | ICD-10-CM | POA: Diagnosis not present

## 2020-11-05 DIAGNOSIS — R112 Nausea with vomiting, unspecified: Secondary | ICD-10-CM | POA: Diagnosis present

## 2020-11-05 DIAGNOSIS — I4891 Unspecified atrial fibrillation: Secondary | ICD-10-CM | POA: Diagnosis present

## 2020-11-05 DIAGNOSIS — K56609 Unspecified intestinal obstruction, unspecified as to partial versus complete obstruction: Secondary | ICD-10-CM | POA: Diagnosis present

## 2020-11-05 DIAGNOSIS — Z9981 Dependence on supplemental oxygen: Secondary | ICD-10-CM | POA: Diagnosis not present

## 2020-11-05 DIAGNOSIS — Z7951 Long term (current) use of inhaled steroids: Secondary | ICD-10-CM

## 2020-11-05 DIAGNOSIS — I5032 Chronic diastolic (congestive) heart failure: Secondary | ICD-10-CM | POA: Diagnosis present

## 2020-11-05 DIAGNOSIS — Z79899 Other long term (current) drug therapy: Secondary | ICD-10-CM

## 2020-11-05 DIAGNOSIS — M1990 Unspecified osteoarthritis, unspecified site: Secondary | ICD-10-CM | POA: Diagnosis present

## 2020-11-05 DIAGNOSIS — J449 Chronic obstructive pulmonary disease, unspecified: Secondary | ICD-10-CM | POA: Diagnosis present

## 2020-11-05 DIAGNOSIS — I130 Hypertensive heart and chronic kidney disease with heart failure and stage 1 through stage 4 chronic kidney disease, or unspecified chronic kidney disease: Secondary | ICD-10-CM | POA: Diagnosis present

## 2020-11-05 DIAGNOSIS — N1832 Chronic kidney disease, stage 3b: Secondary | ICD-10-CM | POA: Diagnosis present

## 2020-11-05 DIAGNOSIS — Z20822 Contact with and (suspected) exposure to covid-19: Secondary | ICD-10-CM | POA: Diagnosis present

## 2020-11-05 HISTORY — DX: Unspecified intestinal obstruction, unspecified as to partial versus complete obstruction: K56.609

## 2020-11-05 LAB — COMPREHENSIVE METABOLIC PANEL
ALT: 15 U/L (ref 0–44)
AST: 24 U/L (ref 15–41)
Albumin: 4.2 g/dL (ref 3.5–5.0)
Alkaline Phosphatase: 70 U/L (ref 38–126)
Anion gap: 13 (ref 5–15)
BUN: 21 mg/dL (ref 8–23)
CO2: 24 mmol/L (ref 22–32)
Calcium: 9.9 mg/dL (ref 8.9–10.3)
Chloride: 102 mmol/L (ref 98–111)
Creatinine, Ser: 1.71 mg/dL — ABNORMAL HIGH (ref 0.44–1.00)
GFR, Estimated: 33 mL/min — ABNORMAL LOW (ref >60)
Glucose, Bld: 101 mg/dL — ABNORMAL HIGH (ref 70–99)
Potassium: 3.2 mmol/L — ABNORMAL LOW (ref 3.5–5.1)
Sodium: 139 mmol/L (ref 135–145)
Total Bilirubin: 0.8 mg/dL (ref 0.3–1.2)
Total Protein: 8.5 g/dL — ABNORMAL HIGH (ref 6.5–8.1)

## 2020-11-05 LAB — CREATININE, SERUM
Creatinine, Ser: 1.65 mg/dL — ABNORMAL HIGH (ref 0.44–1.00)
GFR, Estimated: 34 mL/min — ABNORMAL LOW (ref >60)

## 2020-11-05 LAB — CBC
HCT: 32.8 % — ABNORMAL LOW (ref 36.0–46.0)
HCT: 28.9 % — ABNORMAL LOW (ref 36.0–46.0)
Hemoglobin: 11 g/dL — ABNORMAL LOW (ref 12.0–15.0)
Hemoglobin: 9.7 g/dL — ABNORMAL LOW (ref 12.0–15.0)
MCH: 33.3 pg (ref 26.0–34.0)
MCH: 33.3 pg (ref 26.0–34.0)
MCHC: 33.5 g/dL (ref 30.0–36.0)
MCHC: 33.6 g/dL (ref 30.0–36.0)
MCV: 99.4 fL (ref 80.0–100.0)
MCV: 99.3 fL (ref 80.0–100.0)
Platelets: 229 10*3/uL (ref 150–400)
Platelets: 192 10*3/uL (ref 150–400)
RBC: 3.3 MIL/uL — ABNORMAL LOW (ref 3.87–5.11)
RBC: 2.91 MIL/uL — ABNORMAL LOW (ref 3.87–5.11)
RDW: 13.9 % (ref 11.5–15.5)
RDW: 14 % (ref 11.5–15.5)
WBC: 7.7 10*3/uL (ref 4.0–10.5)
WBC: 6 10*3/uL (ref 4.0–10.5)
nRBC: 0 % (ref 0.0–0.2)
nRBC: 0 % (ref 0.0–0.2)

## 2020-11-05 LAB — LIPASE, BLOOD: Lipase: 29 U/L (ref 11–51)

## 2020-11-05 LAB — SARS CORONAVIRUS 2 (TAT 6-24 HRS): SARS Coronavirus 2: NEGATIVE

## 2020-11-05 MED ORDER — MORPHINE SULFATE (PF) 4 MG/ML IV SOLN
4.0000 mg | Freq: Once | INTRAVENOUS | Status: AC
  Administered 2020-11-05: 4 mg via INTRAVENOUS
  Filled 2020-11-05: qty 1

## 2020-11-05 MED ORDER — SUCRALFATE 1 GM/10ML PO SUSP
1.0000 g | Freq: Four times a day (QID) | ORAL | Status: DC
  Administered 2020-11-05 – 2020-11-10 (×19): 1 g via ORAL
  Filled 2020-11-05 (×22): qty 10

## 2020-11-05 MED ORDER — DILTIAZEM HCL ER COATED BEADS 180 MG PO CP24
180.0000 mg | ORAL_CAPSULE | Freq: Every day | ORAL | Status: DC
  Administered 2020-11-06 – 2020-11-10 (×4): 180 mg via ORAL
  Filled 2020-11-05 (×7): qty 1

## 2020-11-05 MED ORDER — ONDANSETRON HCL 4 MG/2ML IJ SOLN
4.0000 mg | INTRAMUSCULAR | Status: AC
  Administered 2020-11-05: 4 mg via INTRAVENOUS
  Filled 2020-11-05: qty 2

## 2020-11-05 MED ORDER — MORPHINE SULFATE (PF) 4 MG/ML IV SOLN
4.0000 mg | Freq: Once | INTRAVENOUS | Status: AC
  Administered 2020-11-05: 4 mg via INTRAVENOUS

## 2020-11-05 MED ORDER — TORSEMIDE 20 MG PO TABS
20.0000 mg | ORAL_TABLET | ORAL | Status: DC
  Administered 2020-11-06 – 2020-11-10 (×3): 20 mg via ORAL
  Filled 2020-11-05 (×3): qty 1

## 2020-11-05 MED ORDER — HYDROCODONE-ACETAMINOPHEN 5-325 MG PO TABS
1.0000 | ORAL_TABLET | ORAL | Status: DC | PRN
  Administered 2020-11-05: 2 via ORAL
  Administered 2020-11-06 – 2020-11-07 (×2): 1 via ORAL
  Administered 2020-11-07: 2 via ORAL
  Administered 2020-11-09 (×3): 1 via ORAL
  Filled 2020-11-05 (×7): qty 1
  Filled 2020-11-05: qty 2

## 2020-11-05 MED ORDER — UMECLIDINIUM BROMIDE 62.5 MCG/INH IN AEPB
1.0000 | INHALATION_SPRAY | Freq: Every day | RESPIRATORY_TRACT | Status: DC
  Administered 2020-11-06 – 2020-11-10 (×5): 1 via RESPIRATORY_TRACT
  Filled 2020-11-05: qty 7

## 2020-11-05 MED ORDER — FLUTICASONE FUROATE-VILANTEROL 100-25 MCG/INH IN AEPB
1.0000 | INHALATION_SPRAY | Freq: Every day | RESPIRATORY_TRACT | Status: DC
  Administered 2020-11-06 – 2020-11-10 (×5): 1 via RESPIRATORY_TRACT
  Filled 2020-11-05: qty 28

## 2020-11-05 MED ORDER — FLUTICASONE-UMECLIDIN-VILANT 100-62.5-25 MCG/INH IN AEPB: 1.0000 | INHALATION_SPRAY | Freq: Every day | RESPIRATORY_TRACT | Status: DC

## 2020-11-05 MED ORDER — IOHEXOL 300 MG/ML  SOLN
75.0000 mL | Freq: Once | INTRAMUSCULAR | Status: AC | PRN
  Administered 2020-11-05: 75 mL via INTRAVENOUS

## 2020-11-05 MED ORDER — ACETAMINOPHEN 325 MG PO TABS
650.0000 mg | ORAL_TABLET | Freq: Four times a day (QID) | ORAL | Status: DC | PRN
  Administered 2020-11-08: 650 mg via ORAL
  Filled 2020-11-05: qty 2

## 2020-11-05 MED ORDER — TRAMADOL HCL 50 MG PO TABS: 50.0000 mg | ORAL_TABLET | Freq: Four times a day (QID) | ORAL | Status: DC | PRN

## 2020-11-05 MED ORDER — SODIUM CHLORIDE 0.9 % IV BOLUS
1000.0000 mL | Freq: Once | INTRAVENOUS | Status: AC
  Administered 2020-11-05: 1000 mL via INTRAVENOUS

## 2020-11-05 MED ORDER — CARVEDILOL 12.5 MG PO TABS
12.5000 mg | ORAL_TABLET | Freq: Two times a day (BID) | ORAL | Status: DC
  Administered 2020-11-05 – 2020-11-10 (×9): 12.5 mg via ORAL
  Filled 2020-11-05 (×7): qty 1
  Filled 2020-11-05: qty 2
  Filled 2020-11-05 (×2): qty 1

## 2020-11-05 MED ORDER — PANTOPRAZOLE SODIUM 40 MG PO TBEC
40.0000 mg | DELAYED_RELEASE_TABLET | Freq: Two times a day (BID) | ORAL | Status: DC
  Administered 2020-11-05 – 2020-11-10 (×11): 40 mg via ORAL
  Filled 2020-11-05 (×11): qty 1

## 2020-11-05 MED ORDER — MORPHINE SULFATE (PF) 2 MG/ML IV SOLN
2.0000 mg | INTRAVENOUS | Status: DC | PRN
  Administered 2020-11-08 (×2): 2 mg via INTRAVENOUS
  Filled 2020-11-05 (×2): qty 1

## 2020-11-05 MED ORDER — ONDANSETRON 4 MG PO TBDP
4.0000 mg | ORAL_TABLET | Freq: Four times a day (QID) | ORAL | Status: DC | PRN
  Filled 2020-11-05: qty 1

## 2020-11-05 MED ORDER — MORPHINE SULFATE (PF) 4 MG/ML IV SOLN
4.0000 mg | Freq: Once | INTRAVENOUS | Status: DC
  Filled 2020-11-05: qty 1

## 2020-11-05 MED ORDER — ENOXAPARIN SODIUM 40 MG/0.4ML ~~LOC~~ SOLN
40.0000 mg | SUBCUTANEOUS | Status: DC
  Administered 2020-11-05 – 2020-11-09 (×5): 40 mg via SUBCUTANEOUS
  Filled 2020-11-05 (×6): qty 0.4

## 2020-11-05 MED ORDER — DOCUSATE SODIUM 100 MG PO CAPS: 100.0000 mg | ORAL_CAPSULE | Freq: Two times a day (BID) | ORAL | Status: DC | PRN

## 2020-11-05 MED ORDER — ENOXAPARIN SODIUM 30 MG/0.3ML ~~LOC~~ SOLN
30.0000 mg | SUBCUTANEOUS | Status: DC
  Filled 2020-11-05: qty 0.3

## 2020-11-05 MED ORDER — ALBUTEROL SULFATE HFA 108 (90 BASE) MCG/ACT IN AERS
2.0000 | INHALATION_SPRAY | Freq: Four times a day (QID) | RESPIRATORY_TRACT | Status: DC | PRN
  Filled 2020-11-05: qty 6.7

## 2020-11-05 MED ORDER — CITALOPRAM HYDROBROMIDE 20 MG PO TABS
40.0000 mg | ORAL_TABLET | Freq: Every day | ORAL | Status: DC
  Administered 2020-11-05 – 2020-11-10 (×6): 40 mg via ORAL
  Filled 2020-11-05 (×6): qty 2

## 2020-11-05 MED ORDER — ONDANSETRON HCL 4 MG/2ML IJ SOLN: 4.0000 mg | Freq: Four times a day (QID) | INTRAMUSCULAR | Status: DC | PRN

## 2020-11-05 MED ORDER — SODIUM CHLORIDE 0.9 % IV SOLN: INTRAVENOUS | Status: DC

## 2020-11-05 NOTE — ED Notes (Signed)
Report to noel, rn.

## 2020-11-05 NOTE — ED Notes (Signed)
Pt states unable to void at this time. 

## 2020-11-05 NOTE — ED Notes (Signed)
Floor nurse messaged about patient being admitted.

## 2020-11-05 NOTE — ED Notes (Signed)
Pt's legal guardian, [name] Edison Pace, was updated by this RN with the plan of care at this time.

## 2020-11-05 NOTE — ED Notes (Signed)
Pt to ct 

## 2020-11-05 NOTE — ED Notes (Signed)
John, rn in to attempt ultrasound guided iv insertion.

## 2020-11-05 NOTE — Progress Notes (Signed)
PHARMACIST - PHYSICIAN COMMUNICATION  CONCERNING:  Enoxaparin (Lovenox) for DVT Prophylaxis    RECOMMENDATION: Patient was prescribed enoxaprin 40mg  q24 hours for VTE prophylaxis.   Filed Weights   11/05/20 0051  Weight: 58.1 kg (128 lb)    Body mass index is 21.3 kg/m.  Estimated Creatinine Clearance: 29.1 mL/min (A) (by C-G formula based on SCr of 1.71 mg/dL (H)).  Patient is candidate for enoxaparin 30mg  every 24 hours based on CrCl <70ml/min or Weight <45kg  DESCRIPTION: Pharmacy has adjusted enoxaparin dose per [facility] policy.  Patient is now receiving enoxaparin 30 mg every 24 hours    Ena Dawley, PharmD Clinical Pharmacist  11/05/2020 6:08 AM

## 2020-11-05 NOTE — ED Notes (Signed)
Siuction to NG tube turned off.

## 2020-11-05 NOTE — ED Notes (Signed)
Purewick placed. Ostomy bag emptied of liquid, brown stool. Patient is calm, alert and oriented. Patient took oral meds without difficulty.

## 2020-11-05 NOTE — ED Notes (Signed)
DR Lysle Pearl at bedside; changed ostomy bag and biscuit with pt supplies

## 2020-11-05 NOTE — ED Notes (Signed)
Pt with ostomy noted to lower abd. Pt with brown soft stool noted in ostomy bag. Pt states she has not had any output in ostomy bagh since yesterday am. Pt complains of abd pain, vomiting. Pt is concerned she has a bowel obstruction. Pt with previous mastectomy and states cannot place IV in left arm.

## 2020-11-05 NOTE — H&P (Signed)
Subjective:   CC: Small bowel obstruction  HPI:  Danielle [name] is a 65 y.o. female who was consulted by [place] for issue above.  Symptoms were first noted 2 days ago. Pain is sharp, confined to the periumbilical area, without radiation.  Associated with nausea vomiting, exacerbated by nothing specific.  Noted decreasing ostomy output the past couple days   Past Medical History:  has a past medical history of Anxiety, Arthritis, Breast cancer ([place]) (2000), Cancer ([place]) (left ), Cancer of right lung ([place]) (02/21/2016), CHF (congestive heart failure) ([place]), COPD (chronic obstructive pulmonary disease) ([place]), Dependence on supplemental oxygen, Depression, Heart murmur, Hypertension, Lung nodule, Lymphedema, Personal history of chemotherapy, Personal history of radiation therapy, Shortness of breath dyspnea, Status post chemotherapy (2001), and Status post radiation therapy (2001).  Past Surgical History:  Past Surgical History:  Procedure Laterality Date  . Breast Biospy Left    [facility]  . BREAST SURGERY    . COLONOSCOPY N/A 04/30/2018   Procedure: COLONOSCOPY;  Surgeon: Virgel Manifold, MD;  Location: [facility] ENDOSCOPY;  Service: Endoscopy;  Laterality: N/A;  . COLONOSCOPY N/A 07/22/2018   Procedure: COLONOSCOPY;  Surgeon: Virgel Manifold, MD;  Location: [facility] ENDOSCOPY;  Service: Endoscopy;  Laterality: N/A;  . DILATION AND CURETTAGE OF UTERUS    . ELECTROMAGNETIC NAVIGATION BROCHOSCOPY Right 04/11/2016   Procedure: ELECTROMAGNETIC NAVIGATION BRONCHOSCOPY;  Surgeon: Vilinda Boehringer, MD;  Location: [facility] ORS;  Service: Cardiopulmonary;  Laterality: Right;  . ESOPHAGOGASTRODUODENOSCOPY N/A 07/22/2018   Procedure: ESOPHAGOGASTRODUODENOSCOPY (EGD);  Surgeon: Virgel Manifold, MD;  Location: [facility] ENDOSCOPY;  Service: Endoscopy;  Laterality: N/A;  . ESOPHAGOGASTRODUODENOSCOPY (EGD) WITH PROPOFOL N/A 05/07/2018   Procedure: ESOPHAGOGASTRODUODENOSCOPY (EGD) WITH PROPOFOL;  Surgeon: Lucilla Lame, MD;   Location: [facility] ENDOSCOPY;  Service: Endoscopy;  Laterality: N/A;  . ESOPHAGOGASTRODUODENOSCOPY (EGD) WITH PROPOFOL N/A 04/24/2019   Procedure: ESOPHAGOGASTRODUODENOSCOPY (EGD) WITH PROPOFOL;  Surgeon: Jonathon Bellows, MD;  Location: [facility] ENDOSCOPY;  Service: Gastroenterology;  Laterality: N/A;  . ESOPHAGOGASTRODUODENOSCOPY (EGD) WITH PROPOFOL N/A 01/12/2020   Procedure: ESOPHAGOGASTRODUODENOSCOPY (EGD) WITH PROPOFOL;  Surgeon: Jonathon Bellows, MD;  Location: [facility] ENDOSCOPY;  Service: Gastroenterology;  Laterality: N/A;  . ESOPHAGOGASTRODUODENOSCOPY (EGD) WITH PROPOFOL N/A 04/28/2020   Procedure: ESOPHAGOGASTRODUODENOSCOPY (EGD) WITH PROPOFOL;  Surgeon: Jonathon Bellows, MD;  Location: [facility] ENDOSCOPY;  Service: Gastroenterology;  Laterality: N/A;  . EUS N/A 05/07/2019   Procedure: FULL UPPER ENDOSCOPIC ULTRASOUND (EUS) RADIAL;  Surgeon: Jola Schmidt, MD;  Location: [facility] ENDOSCOPY;  Service: Endoscopy;  Laterality: N/A;  . history of colonoscopy]    . ILEOSCOPY N/A 07/22/2018   Procedure: ILEOSCOPY THROUGH STOMA;  Surgeon: Virgel Manifold, MD;  Location: [facility] ENDOSCOPY;  Service: Endoscopy;  Laterality: N/A;  . ILEOSTOMY    . ILEOSTOMY N/A 09/08/2018   Procedure: ILEOSTOMY REVISION POSSIBLE CREATION;  Surgeon: Herbert Pun, MD;  Location: [facility] ORS;  Service: General;  Laterality: N/A;  . ILEOSTOMY CLOSURE N/A 08/15/2018   Procedure: DILATION OF ILEOSTOMY STRICTURE;  Surgeon: Herbert Pun, MD;  Location: [facility] ORS;  Service: General;  Laterality: N/A;  . LAPAROTOMY Right 05/04/2018   Procedure: EXPLORATORY LAPAROTOMY right colectomy right and left ostomy;  Surgeon: Herbert Pun, MD;  Location: [facility] ORS;  Service: General;  Laterality: Right;  . LUNG BIOPSY    . MASTECTOMY Left    2000, [facility]  . ROTATOR CUFF REPAIR Right    [facility]    Family History: family history includes Breast cancer (age of onset: 7) in her paternal aunt; Breast cancer (age of onset: 4) in  her mother; Cancer in  her maternal aunt and mother; Cirrhosis in her father.  Social History:  reports that she quit smoking about 8 years ago. Her smoking use included cigarettes. She has a 10.00 pack-year smoking history. Her smokeless tobacco use includes snuff. She reports previous alcohol use. She reports that she does not use drugs.  Current Medications:  Prior to Admission medications   Medication Sig Start Date End Date Taking? Authorizing Provider  calcium carbonate (OSCAL) 1500 (600 Ca) MG TABS tablet Take 1 tablet by mouth daily.   Yes [provider]  carvedilol (COREG) 6.25 MG tablet Take 6.25 mg by mouth 2 (two) times daily. 10/27/20  Yes [provider]  cholecalciferol (VITAMIN D3) 25 MCG (1000 UNIT) tablet Take 1,000 Units by mouth daily.   Yes [provider]  citalopram (CELEXA) 40 MG tablet Take 40 mg by mouth daily.    Yes [provider]  diltiazem (CARDIZEM CD) 180 MG 24 hr capsule Take 1 capsule (180 mg total) by mouth daily. 08/06/20  Yes Pokhrel, Laxman, MD  ferrous sulfate 325 (65 FE) MG tablet Take 1 tablet (325 mg total) by mouth 2 (two) times daily with a meal. 07/02/18  Yes [name], MD  Fluticasone-Umeclidin-Vilant 100-62.5-25 MCG/INH AEPB Inhale 1 puff into the lungs daily.  01/07/19  Yes [provider]  loperamide (IMODIUM) 2 MG capsule Take 2 mg by mouth as needed for diarrhea or loose stools.   Yes [provider]  Multiple Vitamin (MULTIVITAMIN WITH MINERALS) TABS tablet Take 1 tablet by mouth daily. 06/06/18  Yes Fritzi Mandes, MD  pantoprazole (PROTONIX) 40 MG tablet Take 1 tablet (40 mg total) by mouth 2 (two) times daily before a meal. 08/05/20  Yes Pokhrel, Laxman, MD  potassium chloride (KLOR-CON) 10 MEQ tablet Take 10 mEq by mouth daily. 08/19/20 08/19/21 Yes [provider]  sodium bicarbonate 650 MG tablet Take 2 tablets (1,300 mg total) by mouth 3 (three) times daily. 10/21/19  Yes Wieting, Richard, MD   sucralfate (CARAFATE) 1 GM/10ML suspension Take 10 mLs (1 g total) by mouth 4 (four) times daily. 08/05/20  Yes Pokhrel, Laxman, MD  torsemide (DEMADEX) 20 MG tablet Take 1 tablet (20 mg total) by mouth every other day. 08/05/20  Yes Pokhrel, Corrie Mckusick, MD  acetaminophen (TYLENOL) 325 MG tablet Take 2 tablets (650 mg total) by mouth every 6 (six) hours as needed for mild pain (or Fever >/= 101). 12/24/18   Gouru, Illene Silver, MD  albuterol (PROVENTIL HFA;VENTOLIN HFA) 108 (90 Base) MCG/ACT inhaler Inhale 2 puffs into the lungs every 6 (six) hours as needed for wheezing or shortness of breath. 04/24/18   Demetrios Loll, MD  Nutritional Supplements (FEEDING SUPPLEMENT, NEPRO CARB STEADY,) LIQD Take 237 mLs by mouth 3 (three) times daily between meals. 10/21/19   Loletha Grayer, MD    Allergies:  Allergies as of 11/05/2020  . (No Known Allergies)    ROS:  General: Denies weight loss, weight gain, fatigue, fevers, chills, and night sweats. Eyes: Denies blurry vision, double vision, eye pain, itchy eyes, and tearing. Ears: Denies hearing loss, earache, and ringing in ears. Nose: Denies sinus pain, congestion, infections, runny nose, and nosebleeds. Mouth/throat: Denies hoarseness, sore throat, bleeding gums, and difficulty swallowing. Heart: Denies chest pain, palpitations, racing heart, irregular heartbeat, leg pain or swelling, and decreased activity tolerance. Respiratory: Denies breathing difficulty, shortness of breath, wheezing, cough, and sputum. GI: Denies change in appetite, heartburn, constipation, diarrhea, and blood in stool. GU:  Denies difficulty urinating, pain with urinating, urgency, frequency, blood in urine. Musculoskeletal: Denies joint stiffness, pain, swelling, muscle weakness. Skin: Denies rash, itching, mass, tumors, sores, and boils Neurologic: Denies headache, fainting, dizziness, seizures, numbness, and tingling. Psychiatric: Denies depression, anxiety, difficulty sleeping, and  memory loss. Endocrine: Denies heat or cold intolerance, and increased thirst or urination. Blood/lymph: Denies easy bruising, easy bruising, and swollen glands     Objective:     BP 112/67 (BP Location: Right Arm)   Pulse 71   Temp 98.1 F (36.7 C) (Oral)   Resp 20   Ht 5\' 5"  (1.651 m)   Wt 58.1 kg   SpO2 100%   BMI 21.30 kg/m   Constitutional :  alert, cooperative, appears stated age and no distress  Lymphatics/Throat:  no asymmetry, masses, or scars  Respiratory:  clear to auscultation bilaterally  Cardiovascular:  regular rate and rhythm  Gastrointestinal: Soft, no guarding, tenderness to palpation in periumbilical area and around ileostomy.  Ileostomy mucosa pink, able to be digitized through fascia with some discomfort.  No obvious stenosis.  Mucous fistula remains viable, with no output.   Musculoskeletal: Steady movement  Skin: Cool and moist  Psychiatric: Normal affect, non-agitated, not confused       LABS:  CMP Latest Ref Rng & Units 11/05/2020 11/05/2020 08/24/2020  Glucose 70 - 99 mg/dL - 101(H) 84  BUN 8 - 23 mg/dL - 21 26(H)  Creatinine 0.44 - 1.00 mg/dL 1.65(H) 1.71(H) 1.90(H)  Sodium 135 - 145 mmol/L - 139 140  Potassium 3.5 - 5.1 mmol/L - 3.2(L) 3.7  Chloride 98 - 111 mmol/L - 102 104  CO2 22 - 32 mmol/L - 24 24  Calcium 8.9 - 10.3 mg/dL - 9.9 9.0  Total Protein 6.5 - 8.1 g/dL - 8.5(H) -  Total Bilirubin 0.3 - 1.2 mg/dL - 0.8 -  Alkaline Phos 38 - 126 U/L - 70 -  AST 15 - 41 U/L - 24 -  ALT 0 - 44 U/L - 15 -   CBC Latest Ref Rng & Units 11/05/2020 11/05/2020 10/19/2020  WBC 4.0 - 10.5 K/uL 6.0 7.7 -  Hemoglobin 12.0 - 15.0 g/dL 9.7(L) 11.0(L) 10.7(L)  Hematocrit 36.0 - 46.0 % 28.9(L) 32.8(L) 30.8(L)  Platelets 150 - 400 K/uL 192 229 -    RADS: CLINICAL DATA:  Mid to lower abdominal pain.  Nondraining ostomy  EXAM: CT ABDOMEN AND PELVIS WITH CONTRAST  TECHNIQUE: Multidetector CT imaging of the abdomen and pelvis was performed using the  standard protocol following bolus administration of intravenous contrast.  CONTRAST:  74mL OMNIPAQUE IOHEXOL 300 MG/ML  SOLN  COMPARISON:  10/31/2019  FINDINGS: Lower chest:  Coronary atherosclerosis.  Hepatobiliary: Cystic densities in the central and lower right lobe liver, unchanged.Normal gallbladder. The common bile duct is 9 mm in diameter; no internal filling defect.  Pancreas: Unremarkable.  Spleen: Unremarkable.  Adrenals/Urinary Tract: Negative adrenals. No hydronephrosis or stone. Unremarkable bladder.  Stomach/Bowel: Postoperative bowel with ileostomy on the right and descending colostomy on the left. There is a cluster of dilated and fluid-filled small bowel loops in the right lower quadrant which measure up to 3.3 cm. The downstream small bowel is decompressed.  Vascular/Lymphatic: Heavily calcified aorta and branch vessels from atherosclerosis. No mass or adenopathy.  Reproductive:No pathologic findings.  Other: Trace pelvic ascites.  Musculoskeletal: No acute abnormalities. Remote L3 and L5 compression fractures. Transitional S1 vertebra.  IMPRESSION: Small bowel obstruction with cluster of dilated loops in the right lower quadrant.  Electronically Signed   By: Monte Fantasia M.D.   On: 11/05/2020 04:46 Assessment:   Small bowel obstruction, recurrent.  Hypertension COPD Depression  Plan:   Patient well-known to our service.  The ileostomy site can easily be digitized through the fascia.  We'll place NG tube, n.p.o. except for sips with meds and ice chips, IV fluids, serial abdominal exams, hope to resolve with nonsurgical intervention.  Plan discussed with patient and she is agreeable  Continue home meds for her chronic comorbidities listed above.

## 2020-11-05 NOTE — ED Provider Notes (Signed)
[facility] Emergency Department Provider Note  ____________________________________________   Event Date/Time   First MD Initiated Contact with Patient 11/05/20 [phone]     (approximate)  I have reviewed the triage vital signs and the nursing notes.   HISTORY  Chief Complaint Abdominal Pain    HPI Danielle [name] is a 65 y.o. female with medical history as listed below which notably includes breast cancer on the left side status postmastectomy and a bowel resection years ago that is led to a chronic ostomy.  She presents tonight for acute onset and severe sharp and aching lower abdominal pain associated with nausea and vomiting.  This occurred about 5:30 PM and is constant.  Nothing in particular seems to make it better or worse.  She has vomited multiple times and feels persistently nauseated.  She also notes that she has not had any output into the ostomy bag since this morning.  The pain is severe.  She denies recent fever, sore throat, chest pain, shortness of breath.  She is vaccinated for COVID-19.  She thinks that she had a bowel obstruction in the past but she cannot remember for sure.         Past Medical History:  Diagnosis Date  . Anxiety   . Arthritis   . Breast cancer ([place]) 2000  . Cancer ([place]) left    breast cancer 2000, chemo tx's with total mastectomy and lymph nodes resected.   . Cancer of right lung ([place]) 02/21/2016   rad tx's.   . CHF (congestive heart failure) ([place])   . COPD (chronic obstructive pulmonary disease) ([place])   . Dependence on supplemental oxygen   . Depression   . Heart murmur   . Hypertension   . Lung nodule   . Lymphedema   . Personal history of chemotherapy   . Personal history of radiation therapy   . Shortness of breath dyspnea    with exertion  . Status post chemotherapy 2001   left breast cancer  . Status post radiation therapy 2001   left breast cancer    Patient Active Problem List   Diagnosis Date  Noted  . COPD with acute exacerbation ([place]) 08/01/2020  . Atrial fibrillation with rapid ventricular response ([place]) 08/01/2020  . Cellulitis of left arm 01/20/2020  . CKD (chronic kidney disease), stage IV ([place]) 01/20/2020  . Hypokalemia 01/20/2020  . Elevated troponin 01/20/2020  . Sepsis ([place]) 01/20/2020  . Chronic diastolic CHF (congestive heart failure) ([place]) 01/20/2020  . CHF exacerbation ([place]) 11/06/2019  . Diarrhea   . Acute renal failure superimposed on stage 3b chronic kidney disease ([place]) 10/29/2019  . GERD (gastroesophageal reflux disease) 10/29/2019  . Depression 10/29/2019  . Hyperkalemia 10/14/2019  . History of 2019 novel coronavirus disease (COVID-19) 10/14/2019  . Acute respiratory failure due to COVID-19 ([place]) 09/27/2019  . Respiratory tract infection due to COVID-19 virus 09/27/2019  . Acute blood loss anemia 06/20/2019  . Anemia 06/20/2019  . GI bleeding 06/19/2019  . Anemia of chronic kidney failure, stage 4 (severe) ([place]) 06/12/2019  . 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 Acute on chronic respiratory failure with hypoxia and hypercapnia ([place]) 12/22/2018  . Ileostomy dysfunction ([place]) 09/28/2018  . Reflux esophagitis   . Iron deficiency anemia secondary to blood loss (chronic)   . SBO (small bowel obstruction) ([place]) 06/29/2018  . Chronic systolic heart failure ([place]) 06/09/2018  . HTN (hypertension) 06/09/2018  . Atrial fibrillation ([place]) 06/09/2018  .  Lymphedema 06/09/2018  . Acute kidney injury superimposed on CKD ([place]) 06/04/2018  . Severe protein-calorie malnutrition ([place]) 06/04/2018  . Blood in stool   . Focal (segmental) acute (reversible) ischemia of large intestine ([place])   . Ulceration of intestine   . Diverticulosis of large intestine without diverticulitis   . Abnormal CT scan, colon   . Colitis   . COPD exacerbation ([place])   . Malnutrition of moderate degree 04/23/2018  . Palliative care by specialist   . DNR (do  not resuscitate) discussion   . Weakness generalized   . Hyponatremia 02/10/2017  . Syncope 02/09/2017  . Lung nodule, solitary 07/25/2016  . Malignant neoplasm of upper lobe of right lung ([place]) 07/25/2016  . Carcinoma of overlapping sites of left breast in female, estrogen receptor positive ([place]) 06/22/2016  . Multiple lung nodules 06/22/2016  . Lesion of right lung   . Breast cancer in female [facility]) 02/21/2016  . Moderate COPD (chronic obstructive pulmonary disease) ([place]) 08/24/2014    Past Surgical History:  Procedure Laterality Date  . Breast Biospy Left    [facility]  . BREAST SURGERY    . COLONOSCOPY N/A 04/30/2018   Procedure: COLONOSCOPY;  Surgeon: Virgel Manifold, MD;  Location: [facility] ENDOSCOPY;  Service: Endoscopy;  Laterality: N/A;  . COLONOSCOPY N/A 07/22/2018   Procedure: COLONOSCOPY;  Surgeon: Virgel Manifold, MD;  Location: [facility] ENDOSCOPY;  Service: Endoscopy;  Laterality: N/A;  . DILATION AND CURETTAGE OF UTERUS    . ELECTROMAGNETIC NAVIGATION BROCHOSCOPY Right 04/11/2016   Procedure: ELECTROMAGNETIC NAVIGATION BRONCHOSCOPY;  Surgeon: Vilinda Boehringer, MD;  Location: [facility] ORS;  Service: Cardiopulmonary;  Laterality: Right;  . ESOPHAGOGASTRODUODENOSCOPY N/A 07/22/2018   Procedure: ESOPHAGOGASTRODUODENOSCOPY (EGD);  Surgeon: Virgel Manifold, MD;  Location[facility] ENDOSCOPY;  Service: Endoscopy;  Laterality: N/A;  . ESOPHAGOGASTRODUODENOSCOPY (EGD) WITH PROPOFOL N/A 05/07/2018   Procedure: ESOPHAGOGASTRODUODENOSCOPY (EGD) WITH PROPOFOL;  Surgeon: Lucilla Lame, MD;  Location: [facility] ENDOSCOPY;  Service: Endoscopy;  Laterality: N/A;  . ESOPHAGOGASTRODUODENOSCOPY (EGD) WITH PROPOFOL N/A 04/24/2019   Procedure: ESOPHAGOGASTRODUODENOSCOPY (EGD) WITH PROPOFOL;  Surgeon: Jonathon Bellows, MD;  Location: [facility] ENDOSCOPY;  Service: Gastroenterology;  Laterality: N/A;  . ESOPHAGOGASTRODUODENOSCOPY (EGD) WITH PROPOFOL N/A 01/12/2020   Procedure: ESOPHAGOGASTRODUODENOSCOPY (EGD) WITH PROPOFOL;   Surgeon: Jonathon Bellows, MD;  Location: [facility] ENDOSCOPY;  Service: Gastroenterology;  Laterality: N/A;  . ESOPHAGOGASTRODUODENOSCOPY (EGD) WITH PROPOFOL N/A 04/28/2020   Procedure: ESOPHAGOGASTRODUODENOSCOPY (EGD) WITH PROPOFOL;  Surgeon: Jonathon Bellows, MD;  Location: [facility] ENDOSCOPY;  Service: Gastroenterology;  Laterality: N/A;  . EUS N/A 05/07/2019   Procedure: FULL UPPER ENDOSCOPIC ULTRASOUND (EUS) RADIAL;  Surgeon: Jola Schmidt, MD;  Location: [facility] ENDOSCOPY;  Service: Endoscopy;  Laterality: N/A;  . history of colonoscopy]    . ILEOSCOPY N/A 07/22/2018   Procedure: ILEOSCOPY THROUGH STOMA;  Surgeon: Virgel Manifold, MD;  Location: [facility] ENDOSCOPY;  Service: Endoscopy;  Laterality: N/A;  . ILEOSTOMY    . ILEOSTOMY N/A 09/08/2018   Procedure: ILEOSTOMY REVISION POSSIBLE CREATION;  Surgeon: Herbert Pun, MD;  Location: [facility] ORS;  Service: General;  Laterality: N/A;  . ILEOSTOMY CLOSURE N/A 08/15/2018   Procedure: DILATION OF ILEOSTOMY STRICTURE;  Surgeon: Herbert Pun, MD;  Location: [facility] ORS;  Service: General;  Laterality: N/A;  . LAPAROTOMY Right 05/04/2018   Procedure: EXPLORATORY LAPAROTOMY right colectomy right and left ostomy;  Surgeon: Herbert Pun, MD;  Location: [facility] ORS;  Service: General;  Laterality: Right;  . LUNG BIOPSY    . MASTECTOMY Left    2000, [facility]  .  ROTATOR CUFF REPAIR Right    [facility]    Prior to Admission medications   Medication Sig Start Date End Date Taking? Authorizing Provider  acetaminophen (TYLENOL) 325 MG tablet Take 2 tablets (650 mg total) by mouth every 6 (six) hours as needed for mild pain (or Fever >/= 101). 12/24/18   Gouru, Illene Silver, MD  albuterol (PROVENTIL HFA;VENTOLIN HFA) 108 (90 Base) MCG/ACT inhaler Inhale 2 puffs into the lungs every 6 (six) hours as needed for wheezing or shortness of breath. 04/24/18   Demetrios Loll, MD  calcium carbonate (CALCIUM 600) 1500 (600 Ca) MG TABS tablet Take 1 tablet by mouth daily.    [provider]  carvedilol (COREG) 12.5 MG tablet Take 1 tablet (12.5 mg total) by mouth 2 (two) times daily with a meal. 08/05/20 09/04/20  Pokhrel, Corrie Mckusick, MD  cholecalciferol (VITAMIN D3) 25 MCG (1000 UNIT) tablet Take 1,000 Units by mouth daily.    [provider]  citalopram (CELEXA) 40 MG tablet Take 40 mg by mouth daily.     [provider]  diltiazem (CARDIZEM CD) 180 MG 24 hr capsule Take 1 capsule (180 mg total) by mouth daily. 08/06/20   Pokhrel, Corrie Mckusick, MD  ferrous sulfate 325 (65 FE) MG tablet Take 1 tablet (325 mg total) by mouth 2 (two) times daily with a meal. 07/02/18   [name], MD  Fluticasone-Umeclidin-Vilant 100-62.5-25 MCG/INH AEPB Inhale 1 puff into the lungs daily.  01/07/19   [provider]  Multiple Vitamin (MULTIVITAMIN WITH MINERALS) TABS tablet Take 1 tablet by mouth daily. 06/06/18   Fritzi Mandes, MD  Nutritional Supplements (FEEDING SUPPLEMENT, NEPRO CARB STEADY,) LIQD Take 237 mLs by mouth 3 (three) times daily between meals. 10/21/19   Loletha Grayer, MD  pantoprazole (PROTONIX) 40 MG tablet Take 1 tablet (40 mg total) by mouth 2 (two) times daily before a meal. 08/05/20   Pokhrel, Laxman, MD  potassium chloride (KLOR-CON) 10 MEQ tablet Take by mouth. 08/19/20 08/19/21  [provider]  sodium bicarbonate 650 MG tablet Take 2 tablets (1,300 mg total) by mouth 3 (three) times daily. 10/21/19   Loletha Grayer, MD  sucralfate (CARAFATE) 1 GM/10ML suspension Take 10 mLs (1 g total) by mouth 4 (four) times daily. 08/05/20   Pokhrel, Corrie Mckusick, MD  torsemide (DEMADEX) 20 MG tablet Take 1 tablet (20 mg total) by mouth every other day. 08/05/20   Pokhrel, Corrie Mckusick, MD    Allergies Patient has no known allergies.  Family History  Problem Relation Age of Onset  . Breast cancer Mother 66  . Cancer Mother        Breast   . Cirrhosis Father   . Breast cancer Paternal Aunt 45  . Cancer Maternal Aunt        Breast     Social  History Social History   Tobacco Use  . Smoking status: Former Smoker    Packs/day: 0.50    Years: 20.00    Pack years: 10.00    Types: Cigarettes    Quit date: 07/02/2012    Years since quitting: 8.3  . Smokeless tobacco: Current User    Types: Snuff  . Tobacco comment: quit 2014  Vaping Use  . Vaping Use: Never used  Substance Use Topics  . Alcohol use: Not Currently    Comment: Occasionally  . Drug use: No    Review of Systems Constitutional: No fever/chills Eyes: No visual changes. ENT: No sore throat. Cardiovascular: Denies chest pain. Respiratory: Denies  shortness of breath. Gastrointestinal: lower abd pain, N/V, no output in ostomy Genitourinary: Negative for dysuria. Musculoskeletal: Negative for neck pain.  Negative for back pain. Integumentary: Negative for rash. Neurological: Negative for headaches, focal weakness or numbness.   ____________________________________________   PHYSICAL EXAM:  VITAL SIGNS: ED Triage Vitals  Enc Vitals Group     BP 11/05/20 0050 122/84     Pulse Rate 11/05/20 0050 95     Resp 11/05/20 0050 18     Temp 11/05/20 0050 98.1 F (36.7 C)     Temp Source 11/05/20 0050 Oral     SpO2 11/05/20 0050 94 %     Weight 11/05/20 0051 58.1 kg (128 lb)     Height 11/05/20 0051 1.651 m (5\' 5" )     Head Circumference --      Peak Flow --      Pain Score 11/05/20 0051 10     Pain Loc --      Pain Edu? --      Excl. in [place]? --     Constitutional: Alert and oriented.  Eyes: Conjunctivae are normal.  Head: Atraumatic. Nose: No congestion/rhinnorhea. Mouth/Throat: Patient is wearing a mask. Neck: No stridor.  No meningeal signs.   Cardiovascular: Normal rate, regular rhythm. Good peripheral circulation. Respiratory: Normal respiratory effort.  No retractions. Gastrointestinal: Soft and mildly distended.  Ostomy without any contents.  Tender to palpation throughout without localized peritonitis. Musculoskeletal: No lower extremity  tenderness nor edema. No gross deformities of extremities. Neurologic:  Normal speech and language. No gross focal neurologic deficits are appreciated.  Skin:  Skin is warm, dry and intact. Psychiatric: Mood and affect are normal. Speech and behavior are normal.  ____________________________________________   LABS (all labs ordered are listed, but only abnormal results are displayed)  Labs Reviewed  LIPASE, BLOOD  COMPREHENSIVE METABOLIC PANEL  CBC  URINALYSIS, COMPLETE (UACMP) WITH MICROSCOPIC   ____________________________________________  EKG  ED ECG REPORT I, Hinda Kehr, the attending physician, personally viewed and interpreted this ECG.  Date: 11/05/2020 EKG Time: 3:18 AM Rate: 92 Rhythm: Sinus rhythm with first-degree AV block QRS Axis: normal Intervals: PR interval of 262 ms ST/T Wave abnormalities: normal Narrative Interpretation: no evidence of acute ischemia  ____________________________________________  RADIOLOGY I, Hinda Kehr, personally viewed and evaluated these images (plain radiographs) as part of my medical decision making, as well as reviewing the written report by the radiologist.  ED MD interpretation: Probable bowel obstruction on plain film.  CT scan shows SBO.  Official radiology report(s): DG Abd 1 View  Result Date: 11/05/2020 CLINICAL DATA:  Central abdominal pain and vomiting. EXAM: ABDOMEN - 1 VIEW COMPARISON:  None. FINDINGS: Paucity of gas demonstrated in the abdomen with air-fluid levels demonstrated in the right upper quadrant. Appearance is suspicious for small bowel obstruction. Suggest supine view for correlation. No radiopaque stones. Surgical clips in the pelvis. Calcification of the aorta. No free intra-abdominal air is demonstrated. IMPRESSION: Paucity of gas in the abdomen with air-fluid levels suspicious for small bowel obstruction. Electronically Signed   By: Lucienne Capers M.D.   On: 11/05/2020 01:54     ____________________________________________   PROCEDURES   Procedure(s) performed (including Critical Care):  Procedures   ____________________________________________   INITIAL IMPRESSION / MDM / [place] / ED COURSE  As part of my medical decision making, I reviewed the following data within the [place] notes reviewed and incorporated, Labs reviewed , EKG interpreted , Old chart  reviewed, Radiograph reviewed , Discussed with admitting physician , A consult was requested and obtained from this/these consultant(s) Surgery and Notes from prior ED visits   Differential diagnosis includes, but is not limited to, small bowel obstruction, ileus, volvulus, intra-abdominal infection, neoplasm, ostomy malfunction.  I personally reviewed the patient's imaging and agree with the radiologist's interpretation that there is a high probability of small bowel obstruction.  Lab work was delayed due to difficulty obtaining a peripheral IV but one of the nurses was able to obtain an ultrasound-guided peripheral IV in her right arm.  Blood work is in process and then I will order the CT scan once we verify her renal function is appropriate.  She is getting morphine 4 mg IV, Zofran 4 mg IV, and 1 L normal saline for her pain, nausea, and volume depletion.  I anticipate NG tube placement and admission and I warned her that this was very likely.  She understands and agrees with the plan.       Clinical Course as of 11/05/20 0616  Sat Nov 05, 2020  1914 Comprehensive metabolic panel(!) Patient's comprehensive metabolic panel is generally reassuring.  Her creatinine is stable at 1.7.  This gives her a GFR of 33 which is still acceptable according to radiology protocols for her to get a contrasted CT scan.  I will order it so that we can look for any evidence of inflammation as well as identify a possible transition point of her bowel obstruction. [CF]  7829 CT  ABDOMEN PELVIS W CONTRAST [CF]  5621 CT scan is positive for small bowel obstruction.  I discussed the case by phone with Dr. Lysle Pearl with surgery and he is going to come  to the ED to evaluate the patient in person and admit her to his service.  [CF]  3086 We are going to hold off on NGT until he evaluates the patient personally. [CF]  0505 Patient having more pain again, giving another 4 mg of morphine.  She says she is not quite as nauseated but still little bit so we will give another 4 mg of Zofran.  I updated the patient regarding her results and the plan of care. [CF]    Clinical Course User Index [CF] Hinda Kehr, MD     ____________________________________________  FINAL CLINICAL IMPRESSION(S) / ED DIAGNOSES  Final diagnoses:  Abdominal complaints  Small bowel obstruction (HCC)  Chronic kidney disease, unspecified CKD stage     MEDICATIONS GIVEN DURING THIS VISIT:  Medications  albuterol (VENTOLIN HFA) 108 (90 Base) MCG/ACT inhaler 2 puff (has no administration in time range)  citalopram (CELEXA) tablet 40 mg (has no administration in time range)  acetaminophen (TYLENOL) tablet 650 mg (has no administration in time range)  Fluticasone-Umeclidin-Vilant 100-62.5-25 MCG/INH AEPB 1 puff (has no administration in time range)  carvedilol (COREG) tablet 12.5 mg (has no administration in time range)  pantoprazole (PROTONIX) EC tablet 40 mg (has no administration in time range)  sucralfate (CARAFATE) 1 GM/10ML suspension 1 g (has no administration in time range)  torsemide (DEMADEX) tablet 20 mg (has no administration in time range)  diltiazem (CARDIZEM CD) 24 hr capsule 180 mg (has no administration in time range)  traMADol (ULTRAM) tablet 50 mg (has no administration in time range)  HYDROcodone-acetaminophen (NORCO/VICODIN) 5-325 MG per tablet 1-2 tablet (has no administration in time range)  morphine 2 MG/ML injection 2 mg (has no administration in time range)  docusate sodium  (COLACE) capsule 100 mg (has no administration  in time range)  ondansetron (ZOFRAN-ODT) disintegrating tablet 4 mg (has no administration in time range)    Or  ondansetron (ZOFRAN) injection 4 mg (has no administration in time range)  enoxaparin (LOVENOX) injection 30 mg (has no administration in time range)  0.9 %  sodium chloride infusion (has no administration in time range)  ondansetron (ZOFRAN) injection 4 mg (4 mg Intravenous Given 11/05/20 0302)  sodium chloride 0.9 % bolus 1,000 mL (0 mLs Intravenous Stopped 11/05/20 0410)  morphine 4 MG/ML injection 4 mg (4 mg Intravenous Given 11/05/20 0302)  iohexol (OMNIPAQUE) 300 MG/ML solution 75 mL (75 mLs Intravenous Contrast Given 11/05/20 0425)  morphine 4 MG/ML injection 4 mg (4 mg Intravenous Given 11/05/20 0521)  ondansetron (ZOFRAN) injection 4 mg (4 mg Intravenous Given 11/05/20 9784)     ED Discharge Orders    None      *Please note:  CARRIE USERY was evaluated in Emergency Department on 11/05/2020 for the symptoms described in the history of present illness. She was evaluated in the context of the global COVID-19 pandemic, which necessitated consideration that the patient might be at risk for infection with the SARS-CoV-2 virus that causes COVID-19. Institutional protocols and algorithms that pertain to the evaluation of patients at risk for COVID-19 are in a state of rapid change based on information released by regulatory bodies including the CDC and federal and state organizations. These policies and algorithms were followed during the patient's care in the ED.  Some ED evaluations and interventions may be delayed as a result of limited staffing during and after the pandemic.*  Note:  This document was prepared using Dragon voice recognition software and may include unintentional dictation errors.   Hinda Kehr, MD 11/05/20 [phone]

## 2020-11-05 NOTE — ED Notes (Signed)
Pt sleeping, O2 sats 86%, [place] 2 lpm on att

## 2020-11-05 NOTE — ED Notes (Signed)
Pt ostomy bag is leaking, pt changed into gown due to wet clothing, RN Amber to be notified so we can change the ostomy bag.

## 2020-11-05 NOTE — ED Notes (Addendum)
rebecca, rn and this rn have attempted iv insertion without success. Iv team consult placed.

## 2020-11-05 NOTE — ED Notes (Signed)
Report given to [facility] at change of shift.

## 2020-11-05 NOTE — ED Notes (Signed)
Techs at bedside getting pt ready for transfer to floor.

## 2020-11-05 NOTE — ED Triage Notes (Signed)
Pt arrives via [facility] with c/o middle lower abdominal pain. Pt states that she has an ostomy bag that has not drained since 11/04/2020 AM. Pt reports vomiting and is worried that she has a blockage.

## 2020-11-06 LAB — CBC
HCT: 28.9 % — ABNORMAL LOW (ref 36.0–46.0)
Hemoglobin: 9.5 g/dL — ABNORMAL LOW (ref 12.0–15.0)
MCH: 33.7 pg (ref 26.0–34.0)
MCHC: 32.9 g/dL (ref 30.0–36.0)
MCV: 102.5 fL — ABNORMAL HIGH (ref 80.0–100.0)
Platelets: 188 10*3/uL (ref 150–400)
RBC: 2.82 MIL/uL — ABNORMAL LOW (ref 3.87–5.11)
RDW: 14.4 % (ref 11.5–15.5)
WBC: 4.6 10*3/uL (ref 4.0–10.5)
nRBC: 0 % (ref 0.0–0.2)

## 2020-11-06 LAB — BASIC METABOLIC PANEL
Anion gap: 12 (ref 5–15)
BUN: 19 mg/dL (ref 8–23)
CO2: 21 mmol/L — ABNORMAL LOW (ref 22–32)
Calcium: 8.7 mg/dL — ABNORMAL LOW (ref 8.9–10.3)
Chloride: 110 mmol/L (ref 98–111)
Creatinine, Ser: 1.68 mg/dL — ABNORMAL HIGH (ref 0.44–1.00)
GFR, Estimated: 33 mL/min — ABNORMAL LOW (ref >60)
Glucose, Bld: 71 mg/dL (ref 70–99)
Potassium: 3.8 mmol/L (ref 3.5–5.1)
Sodium: 143 mmol/L (ref 135–145)

## 2020-11-06 LAB — MAGNESIUM: Magnesium: 1.5 mg/dL — ABNORMAL LOW (ref 1.7–2.4)

## 2020-11-06 LAB — PHOSPHORUS: Phosphorus: 3.6 mg/dL (ref 2.5–4.6)

## 2020-11-06 MED ORDER — PHENOL 1.4 % MT LIQD
1.0000 | OROMUCOSAL | Status: DC | PRN
  Administered 2020-11-06: 1 via OROMUCOSAL
  Filled 2020-11-06: qty 177

## 2020-11-06 MED ORDER — ALUM & MAG HYDROXIDE-SIMETH 200-200-20 MG/5ML PO SUSP
30.0000 mL | Freq: Four times a day (QID) | ORAL | Status: DC | PRN
  Administered 2020-11-06: 30 mL via ORAL
  Filled 2020-11-06: qty 30

## 2020-11-06 NOTE — Progress Notes (Signed)
Subjective:  CC: Danielle [name] is a 65 y.o. female  Hospital stay day 1,   SBO  HPI: No issues overnight. BMs noted   ROS:  General: Denies weight loss, weight gain, fatigue, fevers, chills, and night sweats. Heart: Denies chest pain, palpitations, racing heart, irregular heartbeat, leg pain or swelling, and decreased activity tolerance. Respiratory: Denies breathing difficulty, shortness of breath, wheezing, cough, and sputum. GI: Denies change in appetite, heartburn, nausea, vomiting, constipation, diarrhea, and blood in stool. GU: Denies difficulty urinating, pain with urinating, urgency, frequency, blood in urine.   Objective:   Temp:  [97.8 F (36.6 C)-98.7 F (37.1 C)] 98.7 F (37.1 C) (01/23 1200) Pulse Rate:  [68-83] 83 (01/23 1200) Resp:  [15-20] 20 (01/23 1200) BP: (90-119)/(52-74) 119/74 (01/23 1200) SpO2:  [95 %-100 %] 96 % (01/23 1200)     Height: 5\' 5"  (165.1 cm) Weight: 58.1 kg BMI (Calculated): 21.3   Intake/Output this shift:   Intake/Output Summary (Last 24 hours) at 11/06/2020 1536 Last data filed at 11/06/2020 1157 Gross per 24 hour  Intake 1422.63 ml  Output 1100 ml  Net 322.63 ml    Constitutional :  alert, cooperative and appears stated age  Respiratory:  clear to auscultation bilaterally  Cardiovascular:  regular rate and rhythm, S1, S2 normal, no murmur, click, rub or gallop and regular rate and rhythm  Gastrointestinal: soft, slight TTP around ileostomy but productive stool and no distention noted..   Skin: Cool and moist.   Psychiatric: Normal affect, non-agitated, not confused       LABS:  CMP Latest Ref Rng & Units 11/06/2020 11/05/2020 11/05/2020  Glucose 70 - 99 mg/dL 71 - 101(H)  BUN 8 - 23 mg/dL 19 - 21  Creatinine 0.44 - 1.00 mg/dL 1.68(H) 1.65(H) 1.71(H)  Sodium 135 - 145 mmol/L 143 - 139  Potassium 3.5 - 5.1 mmol/L 3.8 - 3.2(L)  Chloride 98 - 111 mmol/L 110 - 102  CO2 22 - 32 mmol/L 21(L) - 24  Calcium 8.9 - 10.3 mg/dL 8.7(L) -  9.9  Total Protein 6.5 - 8.1 g/dL - - 8.5(H)  Total Bilirubin 0.3 - 1.2 mg/dL - - 0.8  Alkaline Phos 38 - 126 U/L - - 70  AST 15 - 41 U/L - - 24  ALT 0 - 44 U/L - - 15   CBC Latest Ref Rng & Units 11/06/2020 11/05/2020 11/05/2020  WBC 4.0 - 10.5 K/uL 4.6 6.0 7.7  Hemoglobin 12.0 - 15.0 g/dL 9.5(L) 9.7(L) 11.0(L)  Hematocrit 36.0 - 46.0 % 28.9(L) 28.9(L) 32.8(L)  Platelets 150 - 400 K/uL 188 192 229    RADS: n/a Assessment:   SBO, resolving.  Tolerated clamp trial and NG removed, started on clears.  Continue to monitor for now.  Hopefully can be d/c'd in next day or two to still be ready for watchmaker placement

## 2020-11-06 NOTE — Progress Notes (Signed)
Mobility Specialist - Progress Note   11/06/20 1640  Mobility  Activity Ambulated in hall  Level of Assistance Minimal assist, patient does 75% or more  Assistive Device None  Distance Ambulated (ft) 200 ft  Mobility Response Tolerated well  Mobility performed by Mobility specialist  $Mobility charge 1 Mobility    Pre-mobility: 85 HR, 96% SpO2 During mobility: 107 HR, 89% SpO2 Post-mobility: 83 HR, 98% SpO2   Pt was lying in bed upon arrival utilizing 1L [place] O2. Pt agreed to session. Pt denied nausea and fatigue, but did c/o soreness at ostomy site. Pt was able to get EOB with minA and extra time to tolerate pain. Pt stood at bedside with supervision. No c/o dizziness while in upright positions. Pt proceeded to ambulate 200' in room/hallway with no LOB noted. Pt continuously denied SOB throughout session, but also stated "It's hard to breathe in, but it's getting better as I keep walking." Heavy breathing noted during activity. O2 desat to 89% after ~140'. Overall, pt tolerated session well. Pt rated her RPE this session a 5/10. Pt was left in bed with all needs in reach and alarm set. Nurse notified.    Kathee Delton Mobility Specialist 11/06/20, 4:46 PM

## 2020-11-07 LAB — BASIC METABOLIC PANEL
Anion gap: 10 (ref 5–15)
BUN: 16 mg/dL (ref 8–23)
CO2: 19 mmol/L — ABNORMAL LOW (ref 22–32)
Calcium: 8.3 mg/dL — ABNORMAL LOW (ref 8.9–10.3)
Chloride: 110 mmol/L (ref 98–111)
Creatinine, Ser: 1.69 mg/dL — ABNORMAL HIGH (ref 0.44–1.00)
GFR, Estimated: 33 mL/min — ABNORMAL LOW (ref >60)
Glucose, Bld: 84 mg/dL (ref 70–99)
Potassium: 3.6 mmol/L (ref 3.5–5.1)
Sodium: 139 mmol/L (ref 135–145)

## 2020-11-07 LAB — CBC
HCT: 25.2 % — ABNORMAL LOW (ref 36.0–46.0)
Hemoglobin: 8.4 g/dL — ABNORMAL LOW (ref 12.0–15.0)
MCH: 34 pg (ref 26.0–34.0)
MCHC: 33.3 g/dL (ref 30.0–36.0)
MCV: 102 fL — ABNORMAL HIGH (ref 80.0–100.0)
Platelets: 181 10*3/uL (ref 150–400)
RBC: 2.47 MIL/uL — ABNORMAL LOW (ref 3.87–5.11)
RDW: 13.9 % (ref 11.5–15.5)
WBC: 3.8 10*3/uL — ABNORMAL LOW (ref 4.0–10.5)
nRBC: 0 % (ref 0.0–0.2)

## 2020-11-07 LAB — PHOSPHORUS: Phosphorus: 2.9 mg/dL (ref 2.5–4.6)

## 2020-11-07 LAB — MAGNESIUM: Magnesium: 1.4 mg/dL — ABNORMAL LOW (ref 1.7–2.4)

## 2020-11-07 NOTE — Progress Notes (Signed)
[facility] Day(s): 2.   Post op day(s):  [name]   Interval History: Patient seen and examined, no acute events or new complaints overnight. Patient reports having right lower quadrant pain.  Pain does not radiate to other part of the body.  Patient denies any nausea or vomiting.  There has been no alleviating or aggravating factors.  Patient has passing gas and stool through the ileostomy.  Vital signs in last 24 hours: [min-max] current  Temp:  [97.5 F (36.4 C)-98.7 F (37.1 C)] 97.5 F (36.4 C) (01/24 1155) Pulse Rate:  [59-79] 59 (01/24 1155) Resp:  [16-20] 16 (01/24 1155) BP: (96-122)/(60-81) 119/67 (01/24 1155) SpO2:  [94 %-99 %] 94 % (01/24 1155)     Height: 5\' 5"  (165.1 cm) Weight: 58.1 kg BMI (Calculated): 21.3   Physical Exam:  Constitutional: alert, cooperative and no distress  Respiratory: breathing non-labored at rest  Cardiovascular: regular rate and sinus rhythm  Gastrointestinal: soft, non-tender, and non-distended.  Ileostomy pink and patent  Labs:  CBC Latest Ref Rng & Units 11/07/2020 11/06/2020 11/05/2020  WBC 4.0 - 10.5 K/uL 3.8(L) 4.6 6.0  Hemoglobin 12.0 - 15.0 g/dL 8.4(L) 9.5(L) 9.7(L)  Hematocrit 36.0 - 46.0 % 25.2(L) 28.9(L) 28.9(L)  Platelets 150 - 400 K/uL 181 188 192   CMP Latest Ref Rng & Units 11/07/2020 11/06/2020 11/05/2020  Glucose 70 - 99 mg/dL 84 71 -  BUN 8 - 23 mg/dL 16 19 -  Creatinine 0.44 - 1.00 mg/dL 1.69(H) 1.68(H) 1.65(H)  Sodium 135 - 145 mmol/L 139 143 -  Potassium 3.5 - 5.1 mmol/L 3.6 3.8 -  Chloride 98 - 111 mmol/L 110 110 -  CO2 22 - 32 mmol/L 19(L) 21(L) -  Calcium 8.9 - 10.3 mg/dL 8.3(L) 8.7(L) -  Total Protein 6.5 - 8.1 g/dL - - -  Total Bilirubin 0.3 - 1.2 mg/dL - - -  Alkaline Phos 38 - 126 U/L - - -  AST 15 - 41 U/L - - -  ALT 0 - 44 U/L - - -    Imaging studies: Impression evaluated the CT scan done at admission.  There is some small bowel dilation.  There is no free air or free  fluid.   Assessment/Plan:  65 y.o. female with small bowel obstruction, complicated by pertinent comorbidities including COPD, atrial fibrillation on anticoagulation.  Patient with persistent right lower quadrant pain.  I think this is due to gas that is trying to pass through the resolving partial obstruction.  There is gas and stool in the ileostomy bag.  I will continue with clear liquids and will not advance until patient started feeling better.  I encouraged the patient to ambulate.  We will continue to follow clinically.   Arnold Long, MD

## 2020-11-07 NOTE — Progress Notes (Signed)
Mobility Specialist - Progress Note   11/07/20 1500  Mobility  Activity Ambulated in hall  Level of Assistance Modified independent, requires aide device or extra time  Assistive Device None  Distance Ambulated (ft) 200 ft  Mobility Response Tolerated well  Mobility performed by Mobility specialist  $Mobility charge 1 Mobility    Pre-mobility: 82 HR, 87% SpO2 During mobility: 87 HR, 90% SpO2 Post-mobility: 71 HR, 94% SpO2   Pt was lying in bed upon arrival with O2 set on 1L, but doffed. Pt stated she had just removed [place]. O2 desat to 87% and PLB was utilized to increase sats to 94% prior to activity. Pt agreed to session. Pt denied nausea and fatigue, but did c/o pain in RLQ "under site". Pt's portable O2 tank was set to .5L for ambulation. Pt was able to get EOB with supervision and stood at bedside without assistance. Pt ambulated 200' in hallway without AD. No LOB noted. Pt denied SOB. Overall, pt tolerated session well. Pt was able to return supine independently with all needs in reach.    Kathee Delton Mobility Specialist 11/07/20, 3:51 PM

## 2020-11-08 LAB — BASIC METABOLIC PANEL
Anion gap: 9 (ref 5–15)
BUN: 14 mg/dL (ref 8–23)
CO2: 21 mmol/L — ABNORMAL LOW (ref 22–32)
Calcium: 8.5 mg/dL — ABNORMAL LOW (ref 8.9–10.3)
Chloride: 107 mmol/L (ref 98–111)
Creatinine, Ser: 1.73 mg/dL — ABNORMAL HIGH (ref 0.44–1.00)
GFR, Estimated: 32 mL/min — ABNORMAL LOW (ref >60)
Glucose, Bld: 86 mg/dL (ref 70–99)
Potassium: 3.9 mmol/L (ref 3.5–5.1)
Sodium: 137 mmol/L (ref 135–145)

## 2020-11-08 LAB — CBC
HCT: 26.8 % — ABNORMAL LOW (ref 36.0–46.0)
Hemoglobin: 8.6 g/dL — ABNORMAL LOW (ref 12.0–15.0)
MCH: 33.3 pg (ref 26.0–34.0)
MCHC: 32.1 g/dL (ref 30.0–36.0)
MCV: 103.9 fL — ABNORMAL HIGH (ref 80.0–100.0)
Platelets: 189 10*3/uL (ref 150–400)
RBC: 2.58 MIL/uL — ABNORMAL LOW (ref 3.87–5.11)
RDW: 13.8 % (ref 11.5–15.5)
WBC: 3.8 10*3/uL — ABNORMAL LOW (ref 4.0–10.5)
nRBC: 0.5 % — ABNORMAL HIGH (ref 0.0–0.2)

## 2020-11-08 LAB — MAGNESIUM: Magnesium: 1.2 mg/dL — ABNORMAL LOW (ref 1.7–2.4)

## 2020-11-08 LAB — PHOSPHORUS: Phosphorus: 2.7 mg/dL (ref 2.5–4.6)

## 2020-11-08 NOTE — Care Management Important Message (Signed)
Important Message  Patient Details  Name: Danielle [name] MRN: [id number] Date of Birth: December 14, 1955   Medicare Important Message Given:  Yes     [name] 11/08/2020, 11:02 AM

## 2020-11-08 NOTE — Progress Notes (Signed)
[facility] Day(s): 3.   Post op day(s):  [name]   Interval History: Patient seen and examined, no acute events or new complaints overnight. Patient reports continue having right lower quadrant abdominal pain. Pain does not radiates to other part of the body. Pain improved by passing gas. No aggravating factors.  She does endorses having bowel movement through the ileostomy and passing gas. Denies nausea and vomiting.   Vital signs in last 24 hours: [min-max] current  Temp:  [97.4 F (36.3 C)-97.9 F (36.6 C)] 97.9 F (36.6 C) (01/25 1549) Pulse Rate:  [55-72] 72 (01/25 1549) Resp:  [16-18] 17 (01/25 1549) BP: (108-128)/(66-71) 110/66 (01/25 1549) SpO2:  [95 %-100 %] 100 % (01/25 1549)     Height: 5\' 5"  (165.1 cm) Weight: 58.1 kg BMI (Calculated): 21.3   Physical Exam:  Constitutional: alert, cooperative and no distress  Respiratory: breathing non-labored at rest  Cardiovascular: regular rate and sinus rhythm  Gastrointestinal: soft, non-tender, and non-distended  Labs:  CBC Latest Ref Rng & Units 11/08/2020 11/07/2020 11/06/2020  WBC 4.0 - 10.5 K/uL 3.8(L) 3.8(L) 4.6  Hemoglobin 12.0 - 15.0 g/dL 8.6(L) 8.4(L) 9.5(L)  Hematocrit 36.0 - 46.0 % 26.8(L) 25.2(L) 28.9(L)  Platelets 150 - 400 K/uL 189 181 188   CMP Latest Ref Rng & Units 11/08/2020 11/07/2020 11/06/2020  Glucose 70 - 99 mg/dL 86 84 71  BUN 8 - 23 mg/dL 14 16 19   Creatinine 0.44 - 1.00 mg/dL 1.73(H) 1.69(H) 1.68(H)  Sodium 135 - 145 mmol/L 137 139 143  Potassium 3.5 - 5.1 mmol/L 3.9 3.6 3.8  Chloride 98 - 111 mmol/L 107 110 110  CO2 22 - 32 mmol/L 21(L) 19(L) 21(L)  Calcium 8.9 - 10.3 mg/dL 8.5(L) 8.3(L) 8.7(L)  Total Protein 6.5 - 8.1 g/dL - - -  Total Bilirubin 0.3 - 1.2 mg/dL - - -  Alkaline Phos 38 - 126 U/L - - -  AST 15 - 41 U/L - - -  ALT 0 - 44 U/L - - -    Imaging studies: No new pertinent imaging studies   Assessment/Plan:  65 y.o. female with small bowel obstruction, complicated  by pertinent comorbidities including COPD, atrial fibrillation on anticoagulation.  Patient with persistent pain on the right lower quadrant. No sign of obstruction but it seems that pain coming from pressure at the ileostomy. Will advance diet and pain management.   Arnold Long, MD

## 2020-11-08 NOTE — Progress Notes (Signed)
Mobility Specialist - Progress Note   11/08/20 1300  Mobility  Activity Ambulated in hall  Level of Assistance Modified independent, requires aide device or extra time  Assistive Device None  Distance Ambulated (ft) 320 ft  Mobility Response Tolerated well  Mobility performed by Mobility specialist  $Mobility charge 1 Mobility    Pre-mobility: 93 HR, 94% SpO2 During mobility: 114 HR, 88% SpO2 Post-mobility: 78 HR, 92% SpO2   Pt was lying in bed upon arrival utilizing room air. O2 sat at 94% on arrival, pt stated she had doffed O2 to eat. Pt agreed to trial room air during session. Pt denied nausea and fatigue, but does still c/o pain in RLQ. Pt is modI in all transfers this date, including ambulation. Pt ambulated 320' in hallway without AD. No LOB noted. Pt with slow gait speed. Pt c/o mild SOB. O2 >/= 88% throughout session. Overall, pt tolerated session well. Upon return to supine, pt's O2 desats to 79% and O2 was reapplied on 1L. Saturations increased to 92% prior to exit. Nurse notified.    Kathee Delton Mobility Specialist 11/08/20, 2:00 PM

## 2020-11-09 LAB — MAGNESIUM: Magnesium: 1.2 mg/dL — ABNORMAL LOW (ref 1.7–2.4)

## 2020-11-09 LAB — BASIC METABOLIC PANEL
Anion gap: 9 (ref 5–15)
BUN: 11 mg/dL (ref 8–23)
CO2: 21 mmol/L — ABNORMAL LOW (ref 22–32)
Calcium: 8.9 mg/dL (ref 8.9–10.3)
Chloride: 107 mmol/L (ref 98–111)
Creatinine, Ser: 1.73 mg/dL — ABNORMAL HIGH (ref 0.44–1.00)
GFR, Estimated: 32 mL/min — ABNORMAL LOW (ref >60)
Glucose, Bld: 79 mg/dL (ref 70–99)
Potassium: 3.6 mmol/L (ref 3.5–5.1)
Sodium: 137 mmol/L (ref 135–145)

## 2020-11-09 LAB — CBC
HCT: 28.3 % — ABNORMAL LOW (ref 36.0–46.0)
Hemoglobin: 9.4 g/dL — ABNORMAL LOW (ref 12.0–15.0)
MCH: 33.5 pg (ref 26.0–34.0)
MCHC: 33.2 g/dL (ref 30.0–36.0)
MCV: 100.7 fL — ABNORMAL HIGH (ref 80.0–100.0)
Platelets: 217 10*3/uL (ref 150–400)
RBC: 2.81 MIL/uL — ABNORMAL LOW (ref 3.87–5.11)
RDW: 13.4 % (ref 11.5–15.5)
WBC: 3.5 10*3/uL — ABNORMAL LOW (ref 4.0–10.5)
nRBC: 0 % (ref 0.0–0.2)

## 2020-11-09 LAB — PHOSPHORUS: Phosphorus: 3.1 mg/dL (ref 2.5–4.6)

## 2020-11-09 NOTE — Progress Notes (Signed)
[facility] Hospital Day(s): 4.   Post op day(s):  [name]   Interval History: Patient seen and examined, no acute events or new complaints overnight. Patient reports feeling a little bit better.  She reported that she is passing more gas and stool.  She denies any nausea or vomiting.  She tolerated full liquids.  Vital signs in last 24 hours: [min-max] current  Temp:  [97.7 F (36.5 C)-98.6 F (37 C)] 97.9 F (36.6 C) (01/26 0425) Pulse Rate:  [62-72] 62 (01/26 0425) Resp:  [16-18] 18 (01/26 0425) BP: (110-129)/(66-79) 129/79 (01/26 0425) SpO2:  [96 %-100 %] 99 % (01/26 0425)     Height: 5\' 5"  (165.1 cm) Weight: 58.1 kg BMI (Calculated): 21.3   Physical Exam:  Constitutional: alert, cooperative and no distress  Respiratory: breathing non-labored at rest  Cardiovascular: regular rate and sinus rhythm  Gastrointestinal: soft, non-tender, and non-distended.  Ileostomy digitalized and is pink and patent.  Labs:  CBC Latest Ref Rng & Units 11/09/2020 11/08/2020 11/07/2020  WBC 4.0 - 10.5 K/uL 3.5(L) 3.8(L) 3.8(L)  Hemoglobin 12.0 - 15.0 g/dL 9.4(L) 8.6(L) 8.4(L)  Hematocrit 36.0 - 46.0 % 28.3(L) 26.8(L) 25.2(L)  Platelets 150 - 400 K/uL 217 189 181   CMP Latest Ref Rng & Units 11/09/2020 11/08/2020 11/07/2020  Glucose 70 - 99 mg/dL 79 86 84  BUN 8 - 23 mg/dL 11 14 16   Creatinine 0.44 - 1.00 mg/dL 1.73(H) 1.73(H) 1.69(H)  Sodium 135 - 145 mmol/L 137 137 139  Potassium 3.5 - 5.1 mmol/L 3.6 3.9 3.6  Chloride 98 - 111 mmol/L 107 107 110  CO2 22 - 32 mmol/L 21(L) 21(L) 19(L)  Calcium 8.9 - 10.3 mg/dL 8.9 8.5(L) 8.3(L)  Total Protein 6.5 - 8.1 g/dL - - -  Total Bilirubin 0.3 - 1.2 mg/dL - - -  Alkaline Phos 38 - 126 U/L - - -  AST 15 - 41 U/L - - -  ALT 0 - 44 U/L - - -    Imaging studies: No new pertinent imaging studies   Assessment/Plan:  65 y.o.femalewith small bowel obstruction, complicated by pertinent comorbidities includingCOPD, atrial fibrillation on  anticoagulation.  Today the ileostomy detailed exam was after.  There was more gas and stool in the bag.  I will advance her diet.  We will continue with current conservative management.  If she tolerates she may be able to be discharged tomorrow.  I encouraged the patient to ambulate.  Arnold Long, MD

## 2020-11-09 NOTE — Progress Notes (Signed)
BP 106/67, HR 69. Order received from Dr Windell Moment to hold pm dose of coreg

## 2020-11-10 LAB — BASIC METABOLIC PANEL
Anion gap: 7 (ref 5–15)
BUN: 14 mg/dL (ref 8–23)
CO2: 21 mmol/L — ABNORMAL LOW (ref 22–32)
Calcium: 9 mg/dL (ref 8.9–10.3)
Chloride: 111 mmol/L (ref 98–111)
Creatinine, Ser: 2.02 mg/dL — ABNORMAL HIGH (ref 0.44–1.00)
GFR, Estimated: 27 mL/min — ABNORMAL LOW (ref >60)
Glucose, Bld: 92 mg/dL (ref 70–99)
Potassium: 3.9 mmol/L (ref 3.5–5.1)
Sodium: 139 mmol/L (ref 135–145)

## 2020-11-10 LAB — CBC
HCT: 27.6 % — ABNORMAL LOW (ref 36.0–46.0)
Hemoglobin: 8.9 g/dL — ABNORMAL LOW (ref 12.0–15.0)
MCH: 33.5 pg (ref 26.0–34.0)
MCHC: 32.2 g/dL (ref 30.0–36.0)
MCV: 103.8 fL — ABNORMAL HIGH (ref 80.0–100.0)
Platelets: 229 10*3/uL (ref 150–400)
RBC: 2.66 MIL/uL — ABNORMAL LOW (ref 3.87–5.11)
RDW: 13.6 % (ref 11.5–15.5)
WBC: 3.9 10*3/uL — ABNORMAL LOW (ref 4.0–10.5)
nRBC: 0 % (ref 0.0–0.2)

## 2020-11-10 LAB — MAGNESIUM: Magnesium: 1.5 mg/dL — ABNORMAL LOW (ref 1.7–2.4)

## 2020-11-10 LAB — PHOSPHORUS: Phosphorus: 3.7 mg/dL (ref 2.5–4.6)

## 2020-11-10 NOTE — Progress Notes (Signed)
Pt given discharge paperwork. Pt verbalized understanding. Pt awaiting for ride.

## 2020-11-10 NOTE — Discharge Summary (Signed)
Patient ID: Danielle [name] MRN: [id number] DOB/AGE: 1956/08/26 65 y.o.  Admit date: 11/05/2020 Discharge date: 11/10/2020   Discharge Diagnoses:  Active Problems:   SBO (small bowel obstruction) ([place])   Procedures: None  Hospital Course: Patient with history of right colectomy and ileostomy.  She came in with decreased appetite, abdominal pain and vomiting.  CT scan revealed suggested small bowel obstruction.  Bowel obstruction was able to be resolved without any intervention.  Patient started having bowel movement through the ileostomy.  The pain is slowly decreasing.  Today the patient tolerated soft diet.  She continued to pass gas and stool through the ileostomy.  No nausea or vomiting.  Physical Exam Abdominal:     General: Abdomen is flat and scaphoid. Bowel sounds are normal.     Palpations: Abdomen is soft.  Skin:    General: Skin is warm.  Neurological:     Mental Status: She is alert and oriented to person, place, and time.   Ileostomy pink and patent able to be digitalized.  Stool in the bag.   Consults: None  Disposition: Discharge disposition: 01-Home or Self Care       Discharge Instructions    Diet - low sodium heart healthy   Complete by: As directed    Increase activity slowly   Complete by: As directed      Allergies as of 11/10/2020   No Known Allergies     Medication List    TAKE these medications   acetaminophen 325 MG tablet Commonly known as: TYLENOL Take 2 tablets (650 mg total) by mouth every 6 (six) hours as needed for mild pain (or Fever >/= 101).   albuterol 108 (90 Base) MCG/ACT inhaler Commonly known as: VENTOLIN HFA Inhale 2 puffs into the lungs every 6 (six) hours as needed for wheezing or shortness of breath.   calcium carbonate 1500 (600 Ca) MG Tabs tablet Commonly known as: OSCAL Take 1 tablet by mouth daily.   carvedilol 6.25 MG tablet Commonly known as: COREG Take 6.25 mg by mouth 2 (two) times daily.    cholecalciferol 25 MCG (1000 UNIT) tablet Commonly known as: VITAMIN D3 Take 1,000 Units by mouth daily.   citalopram 40 MG tablet Commonly known as: CELEXA Take 40 mg by mouth daily.   diltiazem 180 MG 24 hr capsule Commonly known as: CARDIZEM CD Take 1 capsule (180 mg total) by mouth daily.   feeding supplement (NEPRO CARB STEADY) Liqd Take 237 mLs by mouth 3 (three) times daily between meals.   ferrous sulfate 325 (65 FE) MG tablet Take 1 tablet (325 mg total) by mouth 2 (two) times daily with a meal.   Fluticasone-Umeclidin-Vilant 100-62.5-25 MCG/INH Aepb Inhale 1 puff into the lungs daily.   loperamide 2 MG capsule Commonly known as: IMODIUM Take 2 mg by mouth as needed for diarrhea or loose stools.   multivitamin with minerals Tabs tablet Take 1 tablet by mouth daily.   pantoprazole 40 MG tablet Commonly known as: Protonix Take 1 tablet (40 mg total) by mouth 2 (two) times daily before a meal.   potassium chloride 10 MEQ tablet Commonly known as: KLOR-CON Take 10 mEq by mouth daily.   sodium bicarbonate 650 MG tablet Take 2 tablets (1,300 mg total) by mouth 3 (three) times daily.   sucralfate 1 GM/10ML suspension Commonly known as: CARAFATE Take 10 mLs (1 g total) by mouth 4 (four) times daily.   torsemide 20 MG tablet Commonly known as: DEMADEX Take  1 tablet (20 mg total) by mouth every other day.       Follow-up [place], [facility] Follow up in 2 week(s).   Specialty: General Practice Contact information: [place] [address] [phone]              This discharge encounter was more than 30-minute most of the time counseling patient and coordinating plan of care.

## 2020-11-10 NOTE — Discharge Instructions (Signed)
  Diet: Resume home heart healthy regular diet.   Activity: No activity restriction.  Medications: Resume all home medications.   Call office [phone]) at any time if any questions, worsening pain, fevers/chills, bleeding, drainage from incision site, or other concerns.

## 2020-11-16 ENCOUNTER — Inpatient Hospital Stay: Payer: Medicare Other | Attending: Internal Medicine

## 2020-11-16 ENCOUNTER — Inpatient Hospital Stay: Payer: Medicare Other

## 2020-11-16 ENCOUNTER — Inpatient Hospital Stay (HOSPITAL_BASED_OUTPATIENT_CLINIC_OR_DEPARTMENT_OTHER): Payer: Medicare Other | Admitting: Internal Medicine

## 2020-11-16 ENCOUNTER — Emergency Department: Payer: Medicare Other

## 2020-11-16 ENCOUNTER — Encounter: Payer: Self-pay | Admitting: *Deleted

## 2020-11-16 ENCOUNTER — Encounter: Payer: Self-pay | Admitting: Internal Medicine

## 2020-11-16 ENCOUNTER — Observation Stay
Admission: EM | Admit: 2020-11-16 | Discharge: 2020-11-18 | Disposition: A | Discharge status: Home / Self Care | Payer: Medicare Other | Attending: Obstetrics and Gynecology | Admitting: Obstetrics and Gynecology

## 2020-11-16 ENCOUNTER — Other Ambulatory Visit: Payer: Self-pay

## 2020-11-16 VITALS — BP 112/78 | HR 90 | Temp 96.7°F | Resp 16 | Ht 65.0 in | Wt 123.0 lb

## 2020-11-16 DIAGNOSIS — I4892 Unspecified atrial flutter: Secondary | ICD-10-CM

## 2020-11-16 DIAGNOSIS — I959 Hypotension, unspecified: Secondary | ICD-10-CM | POA: Insufficient documentation

## 2020-11-16 DIAGNOSIS — Z79899 Other long term (current) drug therapy: Secondary | ICD-10-CM | POA: Insufficient documentation

## 2020-11-16 DIAGNOSIS — D631 Anemia in chronic kidney disease: Secondary | ICD-10-CM

## 2020-11-16 DIAGNOSIS — R634 Abnormal weight loss: Secondary | ICD-10-CM | POA: Diagnosis not present

## 2020-11-16 DIAGNOSIS — Z9012 Acquired absence of left breast and nipple: Secondary | ICD-10-CM | POA: Diagnosis not present

## 2020-11-16 DIAGNOSIS — N184 Chronic kidney disease, stage 4 (severe): Secondary | ICD-10-CM

## 2020-11-16 DIAGNOSIS — Z9011 Acquired absence of right breast and nipple: Secondary | ICD-10-CM | POA: Diagnosis not present

## 2020-11-16 DIAGNOSIS — Z923 Personal history of irradiation: Secondary | ICD-10-CM | POA: Diagnosis not present

## 2020-11-16 DIAGNOSIS — I130 Hypertensive heart and chronic kidney disease with heart failure and stage 1 through stage 4 chronic kidney disease, or unspecified chronic kidney disease: Secondary | ICD-10-CM | POA: Diagnosis not present

## 2020-11-16 DIAGNOSIS — Z932 Ileostomy status: Secondary | ICD-10-CM | POA: Insufficient documentation

## 2020-11-16 DIAGNOSIS — R630 Anorexia: Secondary | ICD-10-CM | POA: Diagnosis not present

## 2020-11-16 DIAGNOSIS — Z8501 Personal history of malignant neoplasm of esophagus: Secondary | ICD-10-CM | POA: Insufficient documentation

## 2020-11-16 DIAGNOSIS — Z20822 Contact with and (suspected) exposure to covid-19: Secondary | ICD-10-CM | POA: Insufficient documentation

## 2020-11-16 DIAGNOSIS — Z87891 Personal history of nicotine dependence: Secondary | ICD-10-CM | POA: Diagnosis not present

## 2020-11-16 DIAGNOSIS — R911 Solitary pulmonary nodule: Secondary | ICD-10-CM | POA: Diagnosis not present

## 2020-11-16 DIAGNOSIS — R55 Syncope and collapse: Principal | ICD-10-CM | POA: Insufficient documentation

## 2020-11-16 DIAGNOSIS — Z853 Personal history of malignant neoplasm of breast: Secondary | ICD-10-CM | POA: Insufficient documentation

## 2020-11-16 DIAGNOSIS — W19XXXA Unspecified fall, initial encounter: Secondary | ICD-10-CM

## 2020-11-16 DIAGNOSIS — C155 Malignant neoplasm of lower third of esophagus: Secondary | ICD-10-CM | POA: Diagnosis not present

## 2020-11-16 DIAGNOSIS — I480 Paroxysmal atrial fibrillation: Secondary | ICD-10-CM | POA: Diagnosis not present

## 2020-11-16 DIAGNOSIS — I5023 Acute on chronic systolic (congestive) heart failure: Secondary | ICD-10-CM | POA: Diagnosis present

## 2020-11-16 DIAGNOSIS — Z85118 Personal history of other malignant neoplasm of bronchus and lung: Secondary | ICD-10-CM | POA: Diagnosis not present

## 2020-11-16 DIAGNOSIS — Z9221 Personal history of antineoplastic chemotherapy: Secondary | ICD-10-CM | POA: Diagnosis not present

## 2020-11-16 DIAGNOSIS — I5022 Chronic systolic (congestive) heart failure: Secondary | ICD-10-CM | POA: Insufficient documentation

## 2020-11-16 DIAGNOSIS — M25512 Pain in left shoulder: Secondary | ICD-10-CM | POA: Diagnosis not present

## 2020-11-16 DIAGNOSIS — I129 Hypertensive chronic kidney disease with stage 1 through stage 4 chronic kidney disease, or unspecified chronic kidney disease: Secondary | ICD-10-CM | POA: Diagnosis not present

## 2020-11-16 DIAGNOSIS — G8911 Acute pain due to trauma: Secondary | ICD-10-CM

## 2020-11-16 DIAGNOSIS — I10 Essential (primary) hypertension: Secondary | ICD-10-CM | POA: Diagnosis present

## 2020-11-16 DIAGNOSIS — C50919 Malignant neoplasm of unspecified site of unspecified female breast: Secondary | ICD-10-CM | POA: Diagnosis present

## 2020-11-16 LAB — CBC WITH DIFFERENTIAL/PLATELET
Abs Immature Granulocytes: 0.01 10*3/uL (ref 0.00–0.07)
Basophils Absolute: 0.1 10*3/uL (ref 0.0–0.1)
Basophils Relative: 1 %
Eosinophils Absolute: 0.2 10*3/uL (ref 0.0–0.5)
Eosinophils Relative: 5 %
HCT: 29.5 % — ABNORMAL LOW (ref 36.0–46.0)
Hemoglobin: 10.1 g/dL — ABNORMAL LOW (ref 12.0–15.0)
Immature Granulocytes: 0 %
Lymphocytes Relative: 24 %
Lymphs Abs: 1 10*3/uL (ref 0.7–4.0)
MCH: 33.3 pg (ref 26.0–34.0)
MCHC: 34.2 g/dL (ref 30.0–36.0)
MCV: 97.4 fL (ref 80.0–100.0)
Monocytes Absolute: 0.5 10*3/uL (ref 0.1–1.0)
Monocytes Relative: 11 %
Neutro Abs: 2.5 10*3/uL (ref 1.7–7.7)
Neutrophils Relative %: 59 %
Platelets: 277 10*3/uL (ref 150–400)
RBC: 3.03 MIL/uL — ABNORMAL LOW (ref 3.87–5.11)
RDW: 13.2 % (ref 11.5–15.5)
WBC: 4.2 10*3/uL (ref 4.0–10.5)
nRBC: 0 % (ref 0.0–0.2)

## 2020-11-16 LAB — BASIC METABOLIC PANEL
Anion gap: 11 (ref 5–15)
BUN: 59 mg/dL — ABNORMAL HIGH (ref 8–23)
CO2: 19 mmol/L — ABNORMAL LOW (ref 22–32)
Calcium: 9.2 mg/dL (ref 8.9–10.3)
Chloride: 107 mmol/L (ref 98–111)
Creatinine, Ser: 2.38 mg/dL — ABNORMAL HIGH (ref 0.44–1.00)
GFR, Estimated: 22 mL/min — ABNORMAL LOW (ref >60)
Glucose, Bld: 84 mg/dL (ref 70–99)
Potassium: 4.4 mmol/L (ref 3.5–5.1)
Sodium: 137 mmol/L (ref 135–145)

## 2020-11-16 MED ORDER — ACETAMINOPHEN 500 MG PO TABS
1000.0000 mg | ORAL_TABLET | Freq: Once | ORAL | Status: AC
  Administered 2020-11-17: 1000 mg via ORAL
  Filled 2020-11-16: qty 2

## 2020-11-16 NOTE — Assessment & Plan Note (Addendum)
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  Currently s/p definitive radiation [dec 7th 2020].  STABLE; Posttreatment PET scan February 2021-no evidence of any metastatic disease.  No uptake in the esophagus region. EGD July 15 th- NO evidence of recurrence. STABLE  Continue surveillance. Will discuss with [name].   #Severe anemia-likely secondary to CKD stage IV-currently on Retacrit; hemoglobin today is 10.1- HOLD  Retacrit today. STABLE.    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  STABLE;  Await MM labs from today.  #Weight loss/poor appetite- STABLE.   # DISPOSITION:  #  RETACRIT today # in 4 weeks- H&H- possible retacrit # in 8 weeks- H&H- possible retacrit # follow up in 12 weeks- cbc/bmp/ possible Retacrit;-Dr.B

## 2020-11-16 NOTE — ED Triage Notes (Signed)
Pt brought in via ems from home   Pt lost her footing and fell on the kitchen floor tonight.  Pt has left shoulder and left rib pain.  No loc no neck or back pain.  Pt alert.  toradol 30mg  given by ems  Pt alert.

## 2020-11-16 NOTE — ED Provider Notes (Signed)
[facility] Emergency Department Provider Note  ____________________________________________  Time seen: Approximately 11:23 PM  I have reviewed the triage vital signs and the nursing notes.   HISTORY  Chief Complaint Fall   HPI Danielle Mcbreen [name] is a 65 y.o. female with several PMH as listed below who presents for evaluation after a syncopal event.  Patient reports that she had just come home from an infant when she was walking in her kitchen and next thing she knows she is on the floor.  She believes that she blacked out.  She is complaining of left rib cage and left shoulder pain.  She does not believe she hit her head on the floor because she has no headache or neck pain.  No back pain, no lower extremity pain, no shortness of breath, no chest pain, no abdominal pain.  Patient denies any preceding dizziness or symptoms before she blacked out.  She denies any prior history of syncope.  She reports being in her usual state of health when this event happened.    Past Medical History:  Diagnosis Date  . Anxiety   . Arthritis   . Breast cancer ([place]) 2000  . Cancer ([place]) left    breast cancer 2000, chemo tx's with total mastectomy and lymph nodes resected.   . Cancer of right lung ([place]) 02/21/2016   rad tx's.   . CHF (congestive heart failure) ([place])   . COPD (chronic obstructive pulmonary disease) ([place])   . Dependence on supplemental oxygen   . Depression   . Heart murmur   . Hypertension   . Lung nodule   . Lymphedema   . Personal history of chemotherapy   . Personal history of radiation therapy   . Shortness of breath dyspnea    with exertion  . Status post chemotherapy 2001   left breast cancer  . Status post radiation therapy 2001   left breast cancer    Patient Active Problem List   Diagnosis Date Noted  . COPD with acute exacerbation ([place]) 08/01/2020  . Atrial fibrillation with rapid ventricular response ([place]) 08/01/2020  . Cellulitis of left  arm 01/20/2020  . CKD (chronic kidney disease), stage IV ([place]) 01/20/2020  . Hypokalemia 01/20/2020  . Elevated troponin 01/20/2020  . Sepsis ([place]) 01/20/2020  . Chronic diastolic CHF (congestive heart failure) ([place]) 01/20/2020  . CHF exacerbation ([place]) 11/06/2019  . Diarrhea   . Acute renal failure superimposed on stage 3b chronic kidney disease ([place]) 10/29/2019  . GERD (gastroesophageal reflux disease) 10/29/2019  . Depression 10/29/2019  . Hyperkalemia 10/14/2019  . History of 2019 novel coronavirus disease (COVID-19) 10/14/2019  . Acute respiratory failure due to COVID-19 ([place]) 09/27/2019  . Respiratory tract infection due to COVID-19 virus 09/27/2019  . Acute blood loss anemia 06/20/2019  . Anemia 06/20/2019  . GI bleeding 06/19/2019  . Anemia of chronic kidney failure, stage 4 (severe) ([place]) 06/12/2019  . 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 Acute on chronic respiratory failure with hypoxia and hypercapnia ([place]) 12/22/2018  . Ileostomy dysfunction ([place]) 09/28/2018  . Reflux esophagitis   . Iron deficiency anemia secondary to blood loss (chronic)   . SBO (small bowel obstruction) ([place]) 06/29/2018  . Chronic systolic heart failure ([place]) 06/09/2018  . HTN (hypertension) 06/09/2018  . Atrial fibrillation ([place]) 06/09/2018  . Lymphedema 06/09/2018  . Acute kidney injury superimposed on CKD ([place]) 06/04/2018  . Severe protein-calorie malnutrition ([place]) 06/04/2018  . Blood in  stool   . Focal (segmental) acute (reversible) ischemia of large intestine ([place])   . Ulceration of intestine   . Diverticulosis of large intestine without diverticulitis   . Abnormal CT scan, colon   . Colitis   . COPD exacerbation ([place])   . Malnutrition of moderate degree 04/23/2018  . Palliative care by specialist   . DNR (do not resuscitate) discussion   . Weakness generalized   . Hyponatremia 02/10/2017  . Syncope 02/09/2017  . Lung nodule, solitary 07/25/2016  .  Malignant neoplasm of upper lobe of right lung ([place]) 07/25/2016  . Carcinoma of overlapping sites of left breast in female, estrogen receptor positive ([place]) 06/22/2016  . Multiple lung nodules 06/22/2016  . Lesion of right lung   . Breast cancer in female [facility]) 02/21/2016  . Moderate COPD (chronic obstructive pulmonary disease) ([place]) 08/24/2014    Past Surgical History:  Procedure Laterality Date  . Breast Biospy Left    [facility]  . BREAST SURGERY    . COLONOSCOPY N/A 04/30/2018   Procedure: COLONOSCOPY;  Surgeon: Virgel Manifold, MD;  Location: [facility] ENDOSCOPY;  Service: Endoscopy;  Laterality: N/A;  . COLONOSCOPY N/A 07/22/2018   Procedure: COLONOSCOPY;  Surgeon: Virgel Manifold, MD;  Location: [facility] ENDOSCOPY;  Service: Endoscopy;  Laterality: N/A;  . DILATION AND CURETTAGE OF UTERUS    . ELECTROMAGNETIC NAVIGATION BROCHOSCOPY Right 04/11/2016   Procedure: ELECTROMAGNETIC NAVIGATION BRONCHOSCOPY;  Surgeon: Vilinda Boehringer, MD;  Location: [facility] ORS;  Service: Cardiopulmonary;  Laterality: Right;  . ESOPHAGOGASTRODUODENOSCOPY N/A 07/22/2018   Procedure: ESOPHAGOGASTRODUODENOSCOPY (EGD);  Surgeon: Virgel Manifold, MD;  Location[facility] ENDOSCOPY;  Service: Endoscopy;  Laterality: N/A;  . ESOPHAGOGASTRODUODENOSCOPY (EGD) WITH PROPOFOL N/A 05/07/2018   Procedure: ESOPHAGOGASTRODUODENOSCOPY (EGD) WITH PROPOFOL;  Surgeon: Lucilla Lame, MD;  Location: [facility] ENDOSCOPY;  Service: Endoscopy;  Laterality: N/A;  . ESOPHAGOGASTRODUODENOSCOPY (EGD) WITH PROPOFOL N/A 04/24/2019   Procedure: ESOPHAGOGASTRODUODENOSCOPY (EGD) WITH PROPOFOL;  Surgeon: Jonathon Bellows, MD;  Location: [facility] ENDOSCOPY;  Service: Gastroenterology;  Laterality: N/A;  . ESOPHAGOGASTRODUODENOSCOPY (EGD) WITH PROPOFOL N/A 01/12/2020   Procedure: ESOPHAGOGASTRODUODENOSCOPY (EGD) WITH PROPOFOL;  Surgeon: Jonathon Bellows, MD;  Location: [facility] ENDOSCOPY;  Service: Gastroenterology;  Laterality: N/A;  . ESOPHAGOGASTRODUODENOSCOPY (EGD) WITH  PROPOFOL N/A 04/28/2020   Procedure: ESOPHAGOGASTRODUODENOSCOPY (EGD) WITH PROPOFOL;  Surgeon: Jonathon Bellows, MD;  Location: [facility] ENDOSCOPY;  Service: Gastroenterology;  Laterality: N/A;  . EUS N/A 05/07/2019   Procedure: FULL UPPER ENDOSCOPIC ULTRASOUND (EUS) RADIAL;  Surgeon: Jola Schmidt, MD;  Location: [facility] ENDOSCOPY;  Service: Endoscopy;  Laterality: N/A;  . history of colonoscopy]    . ILEOSCOPY N/A 07/22/2018   Procedure: ILEOSCOPY THROUGH STOMA;  Surgeon: Virgel Manifold, MD;  Location: [facility] ENDOSCOPY;  Service: Endoscopy;  Laterality: N/A;  . ILEOSTOMY    . ILEOSTOMY N/A 09/08/2018   Procedure: ILEOSTOMY REVISION POSSIBLE CREATION;  Surgeon: Herbert Pun, MD;  Location: [facility] ORS;  Service: General;  Laterality: N/A;  . ILEOSTOMY CLOSURE N/A 08/15/2018   Procedure: DILATION OF ILEOSTOMY STRICTURE;  Surgeon: Herbert Pun, MD;  Location: [facility] ORS;  Service: General;  Laterality: N/A;  . LAPAROTOMY Right 05/04/2018   Procedure: EXPLORATORY LAPAROTOMY right colectomy right and left ostomy;  Surgeon: Herbert Pun, MD;  Location: [facility] ORS;  Service: General;  Laterality: Right;  . LUNG BIOPSY    . MASTECTOMY Left    2000, [facility]  . ROTATOR CUFF REPAIR Right    [facility]    Prior to Admission medications   Medication Sig Start Date End Date  Taking? Authorizing Provider  acetaminophen (TYLENOL) 325 MG tablet Take 2 tablets (650 mg total) by mouth every 6 (six) hours as needed for mild pain (or Fever >/= 101). 12/24/18   Gouru, Illene Silver, MD  albuterol (PROVENTIL HFA;VENTOLIN HFA) 108 (90 Base) MCG/ACT inhaler Inhale 2 puffs into the lungs every 6 (six) hours as needed for wheezing or shortness of breath. 04/24/18   Demetrios Loll, MD  calcium carbonate (OSCAL) 1500 (600 Ca) MG TABS tablet Take 1 tablet by mouth daily.    [provider]  carvedilol (COREG) 6.25 MG tablet Take 6.25 mg by mouth 2 (two) times daily. 10/27/20   [provider]  cholecalciferol (VITAMIN  D3) 25 MCG (1000 UNIT) tablet Take 1,000 Units by mouth daily.    [provider]  citalopram (CELEXA) 40 MG tablet Take 40 mg by mouth daily.     [provider]  diltiazem (CARDIZEM CD) 180 MG 24 hr capsule Take 1 capsule (180 mg total) by mouth daily. 08/06/20   Pokhrel, Corrie Mckusick, MD  ferrous sulfate 325 (65 FE) MG tablet Take 1 tablet (325 mg total) by mouth 2 (two) times daily with a meal. 07/02/18   [name], MD  Fluticasone-Umeclidin-Vilant 100-62.5-25 MCG/INH AEPB Inhale 1 puff into the lungs daily.  01/07/19   [provider]  loperamide (IMODIUM) 2 MG capsule Take 2 mg by mouth as needed for diarrhea or loose stools.    [provider]  Multiple Vitamin (MULTIVITAMIN WITH MINERALS) TABS tablet Take 1 tablet by mouth daily. 06/06/18   Fritzi Mandes, MD  Nutritional Supplements (FEEDING SUPPLEMENT, NEPRO CARB STEADY,) LIQD Take 237 mLs by mouth 3 (three) times daily between meals. 10/21/19   Loletha Grayer, MD  pantoprazole (PROTONIX) 40 MG tablet Take 1 tablet (40 mg total) by mouth 2 (two) times daily before a meal. 08/05/20   Pokhrel, [place]man, MD  potassium chloride (KLOR-CON) 10 MEQ tablet Take 10 mEq by mouth daily. 08/19/20 08/19/21  [provider]  sodium bicarbonate 650 MG tablet Take 2 tablets (1,300 mg total) by mouth 3 (three) times daily. 10/21/19   Loletha Grayer, MD  sucralfate (CARAFATE) 1 GM/10ML suspension Take 10 mLs (1 g total) by mouth 4 (four) times daily. 08/05/20   Pokhrel, Corrie Mckusick, MD  torsemide (DEMADEX) 20 MG tablet Take 1 tablet (20 mg total) by mouth every other day. 08/05/20   Pokhrel, Corrie Mckusick, MD    Allergies Patient has no known allergies.  Family History  Problem Relation Age of Onset  . Breast cancer Mother 28  . Cancer Mother        Breast   . Cirrhosis Father   . Breast cancer Paternal Aunt 59  . Cancer Maternal Aunt        Breast     Social History Social History   Tobacco Use  . Smoking  status: Former Smoker    Packs/day: 0.50    Years: 20.00    Pack years: 10.00    Types: Cigarettes    Quit date: 07/02/2012    Years since quitting: 8.3  . Smokeless tobacco: Current User    Types: Snuff  . Tobacco comment: quit 2014  Vaping Use  . Vaping Use: Never used  Substance Use Topics  . Alcohol use: Not Currently    Comment: Occasionally  . Drug use: No    Review of Systems  Constitutional: Negative for fever. Eyes: Negative for visual changes. ENT: Negative for facial injury or neck injury Cardiovascular: Negative  for chest injury. Respiratory: Negative for shortness of breath. + chest wall pain Gastrointestinal: Negative for abdominal pain or injury. Genitourinary: Negative for dysuria. Musculoskeletal: Negative for back injury,+ L shoulder pain. Skin: Negative for laceration/abrasions. Neurological: Negative for head injury.   ____________________________________________   PHYSICAL EXAM:  VITAL SIGNS: ED Triage Vitals  Enc Vitals Group     BP 11/16/20 2246 101/62     Pulse Rate 11/16/20 2246 69     Resp 11/16/20 2246 18     Temp 11/16/20 2246 97.7 F (36.5 C)     Temp Source 11/16/20 2246 Oral     SpO2 11/16/20 2246 97 %     Weight 11/16/20 2247 124 lb (56.2 kg)     Height 11/16/20 2247 5\' 5"  (1.651 m)     Head Circumference --      Peak Flow --      Pain Score 11/16/20 2247 10     Pain Loc --      Pain Edu? --      Excl. in [place]? --     Full spinal precautions maintained throughout the trauma exam. Constitutional: Alert and oriented. No acute distress. Does not appear intoxicated. HEENT Head: Normocephalic and atraumatic. Face: No facial bony tenderness. Stable midface Ears: No hemotympanum bilaterally. No Battle sign Eyes: No eye injury. PERRL. No raccoon eyes Nose: Nontender. No epistaxis. No rhinorrhea Mouth/Throat: Mucous membranes are moist. No oropharyngeal blood. No dental injury. Airway patent without stridor. Normal voice. Neck: no  C-collar. No midline c-spine tenderness.  Cardiovascular: Normal rate, regular rhythm. Normal and symmetric distal pulses are present in all extremities. Pulmonary/Chest: Chest wall is tender to palpation over the L lateral side with no bruising or deformity. Normal respiratory effort. Breath sounds are normal. No crepitus.  Abdominal: Soft, nontender, non distended. Musculoskeletal: Tender to palpation on the anterior aspect of the left shoulder with no obvious deformity or bruising. Nontender with normal full range of motion in all extremities. No deformities. No thoracic or lumbar midline spinal tenderness. Pelvis is stable. Skin: Skin is warm, dry and intact. No abrasions or contutions. Psychiatric: Speech and behavior are appropriate. Neurological: Normal speech and language. Moves all extremities to command. No gross focal neurologic deficits are appreciated.  Glascow Coma Score: 4 - Opens eyes on own 6 - Follows simple motor commands 5 - Alert and oriented GCS: 15   ____________________________________________   LABS (all labs ordered are listed, but only abnormal results are displayed)  Labs Reviewed  CBC WITH DIFFERENTIAL/PLATELET - Abnormal; Notable for the following components:      Result Value   RBC 3.00 (*)    Hemoglobin 9.8 (*)    HCT 29.5 (*)    All other components within normal limits  COMPREHENSIVE METABOLIC PANEL - Abnormal; Notable for the following components:   Sodium 134 (*)    CO2 18 (*)    BUN 57 (*)    Creatinine, Ser 2.07 (*)    GFR, Estimated 26 (*)    All other components within normal limits  TROPONIN I (HIGH SENSITIVITY) - Abnormal; Notable for the following components:   Troponin I (High Sensitivity) 18 (*)    All other components within normal limits  SARS CORONAVIRUS 2 (TAT 6-24 HRS)  TROPONIN I (HIGH SENSITIVITY)   ____________________________________________  EKG  ED ECG REPORT I, Rudene Re, the attending physician, personally  viewed and interpreted this ECG.  Sinus rhythm, rate of 72, normal intervals, normal axis, no ST elevations  or depressions. ____________________________________________  RADIOLOGY  I have personally reviewed the images performed during this visit and I agree with the Radiologist's read.   Interpretation by Radiologist:  DG Ribs Unilateral W/Chest Left  Result Date: 11/17/2020 CLINICAL DATA:  Fall EXAM: LEFT RIBS AND CHEST - 3+ VIEW COMPARISON:  07/31/2020, 01/20/2020 FINDINGS: Unchanged nodular density in the right upper lobe. Cardiomediastinal contours are normal. Calcific atherosclerosis. No rib fracture. IMPRESSION: No rib fracture. Electronically Signed   By: Ulyses Jarred M.D.   On: 11/17/2020 00:12   CT Head Wo Contrast  Result Date: 11/16/2020 CLINICAL DATA:  Fall EXAM: CT HEAD WITHOUT CONTRAST TECHNIQUE: Contiguous axial images were obtained from the base of the skull through the vertex without intravenous contrast. COMPARISON:  06/05/2020 FINDINGS: Brain: There is no mass, hemorrhage or extra-axial collection. The size and configuration of the ventricles and extra-axial CSF spaces are normal. The brain parenchyma is normal, without acute or chronic infarction. Vascular: No abnormal hyperdensity of the major intracranial arteries or dural venous sinuses. No intracranial atherosclerosis. Skull: The visualized skull base, calvarium and extracranial soft tissues are normal. Sinuses/Orbits: No fluid levels or advanced mucosal thickening of the visualized paranasal sinuses. No mastoid or middle ear effusion. The orbits are normal. IMPRESSION: Normal head CT. Electronically Signed   By: Ulyses Jarred M.D.   On: 11/16/2020 23:45   DG Shoulder Left  Result Date: 11/17/2020 CLINICAL DATA:  Fall EXAM: LEFT SHOULDER - 2+ VIEW COMPARISON:  None. FINDINGS: No fracture of the proximal left humerus. Ossific irregularity at the most lateral aspect of the left clavicle is favored to be tendinous  calcification. No dislocation. IMPRESSION: 1. No fracture or dislocation of the proximal left humerus. 2. Ossific irregularity at the most lateral aspect of the left clavicle is favored to be tendinous calcification. Electronically Signed   By: Ulyses Jarred M.D.   On: 11/17/2020 00:09      ____________________________________________   PROCEDURES  Procedure(s) performed: yes .1-3 Lead EKG Interpretation Performed by: Rudene Re, MD Authorized by: Rudene Re, MD     Interpretation: non-specific     ECG rate assessment: normal     Rhythm: sinus rhythm     Ectopy: none     Critical Care performed:  None ____________________________________________   INITIAL IMPRESSION / ASSESSMENT AND PLAN / ED COURSE  65 y.o. female with several PMH as listed below who presents for evaluation after a syncopal event with no preceding symptoms which caused a fall and L shoulder and chest wall pain. Patient denies any prior history of syncope.   Ddx cardiac syncope, vasovagal, dehydration, anemia. Patient with recent admission to [facility] for bowel obstruction with discharge 5 days ago. She has no abd pain. Low suspicion for PE with no CP, no SOB, no tachycardia, tachypnea, or hypoxia.   Will get EKG, labs, monitor on telemetry for any signs of dysrhythmias. Will get CT head, CXR and L shoulder XR.   Old medical records reviewed with no prior presentations for syncope.   _________________________ 3:52 AM on 11/17/2020 -----------------------------------------  Imaging studies visualized by me with no acute traumatic injuries, confirmed by radiology. EKG with no signs of dysrhythmias or ischemia. Patient monitored on telemetry with no signs of dysrhythmias. 2 high-sensitivity troponins were within normal limits. Patient with stable mild anemia. No leukocytosis. No significant electrolyte derangements, stable kidney function. Blood pressure trending down with now systolics in the 89F.  Possibly mild dehydration. I reviewed her old medical records including recent echo from  last year showing normal EF. Will give a liter bolus. Will admit patient for syncope work-up    ____________________________________________  Please note:  Patient was evaluated in Emergency Department today for the symptoms described in the history of present illness. Patient was evaluated in the context of the global COVID-19 pandemic, which necessitated consideration that the patient might be at risk for infection with the SARS-CoV-2 virus that causes COVID-19. Institutional protocols and algorithms that pertain to the evaluation of patients at risk for COVID-19 are in a state of rapid change based on information released by regulatory bodies including the CDC and federal and state organizations. These policies and algorithms were followed during the patient's care in the ED.  Some ED evaluations and interventions may be delayed as a result of limited staffing during the pandemic.   ____________________________________________   FINAL CLINICAL IMPRESSION(S) / ED DIAGNOSES   Final diagnoses:  Fall  Syncope, unspecified syncope type  Hypotension, unspecified hypotension type      NEW MEDICATIONS STARTED DURING THIS VISIT:  ED Discharge Orders    None       Note:  This document was prepared using Dragon voice recognition software and may include unintentional dictation errors.    Alfred Levins, [place], MD 11/17/20 [phone]

## 2020-11-16 NOTE — Progress Notes (Signed)
[place] OFFICE PROGRESS NOTE  Patient Care Team: [facility] [place] as PCP - General ([place]) [name], FNP as Nurse Practitioner (Family Medicine) [name], NP as Nurse Practitioner (Nurse Practitioner) [name], RN as Registered Nurse [name], RN as Registered Nurse [name], RN as Oncology Nurse Navigator [name], MD as Consulting Physician (Hematology and Oncology)  Cancer Staging Breast cancer in female [facility]) Staging form: Breast, AJCC 7th Edition - Clinical: Stage IIIA (T3, N1, M0) - Signed by [name], NP on 02/21/2016 Estrogen receptor status: Positive Progesterone receptor status: Positive    Oncology History Overview Note  # 2016- JAN RUL 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 A. LUNG MASS, RIGHT; CT-GUIDED BIOPSY: - ADENOCARCINOMA, ACINAR AND PAPILLARY MORPHOLOGIES. S/p RT [finished Dec 2017] s/p RT [DEC 2017]; AUG 2019- PET-right middle lobe radiation changes no evidence of recurrence.  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  T1b- submucosal involvement; N0- stage I [[name]; [facility]]; unable to proceed with Endo mucosal resection-because of extensive disease [Dr.Branch; [facility]]; recommend definitive radiation [finished  RT- 09/21/2019]   #September 2020 worsening macrocytic anemia--bone marrow biopsy shows plasma cells up to 5%; unlikely to explain patient's anemia.  MDS is still a possibility-if anemia worsens would recommend peripheral blood NGS.  Jan 2021 -anemia likely secondary to worsening CKD-Retacrit  # 2000-  Left breast cancer [T3N1- stage  III] mastecomy s/p RT; s/p chemo; Tamoxifen  # NOV 111 2017- Mol testing [RML- adeno ca]  # 2019-July-aug 2019-ischemic colitis status post right hemicolectomy [Dr.[name]]; acute renal failure;  -----------------------------------------------------------------    DIAGNOSIS: RML Lung ca adeno [stage I] #squamous cell carcinoma esophagus -stage I   CURRENT/MOST RECENT THERAPY: Status post radiation       Breast cancer in female [facility])  07/24/1999 Initial Diagnosis   Breast cancer in female ([place]) T3 N1 M0 tumor ER/PR positive   08/24/1999 -  Anti-estrogen oral therapy   Started tamoxifen   08/24/1999 Surgery   Status post modified radical mastectomy of left breast    Chemotherapy      Radiation Therapy     11/24/2003 - 02/20/2009 Anti-estrogen oral therapy   Started Aromasin   Carcinoma of overlapping sites of left breast in female, estrogen receptor positive ([place])  06/22/2016 Initial Diagnosis   Cancer of overlapping sites of left female breast ([place])   Malignant neoplasm of upper lobe of right lung ([place])  Malignant neoplasm of lower third of esophagus ([place])  05/01/2019 Initial Diagnosis   Malignant neoplasm of lower third of esophagus ([place])     INTERVAL HISTORY:  Danielle [name] 65 y.o.  female pleasant patient above history of multiple malignancies including most recent stage I squamous cell cancer of esophagus currently status post radiation; and history of severe anemia is here for follow-up.  In the interim patient was admitted to hospital few months ago for small bowel obstruction.  Symptoms improved conservatively.   Denies any difficulty swallowing pain with swallowing.  No nausea no vomiting.  Chronic shortness of breath chronic cough not any worse.   Review of Systems  Constitutional: Positive for malaise/fatigue and weight loss. Negative for chills,  diaphoresis and fever.  HENT: Negative for nosebleeds and sore throat.   Eyes: Negative for double vision.   Respiratory: Positive for cough and shortness of breath. Negative for hemoptysis, sputum production and wheezing.   Cardiovascular: Negative for chest pain, palpitations, orthopnea and leg swelling.  Gastrointestinal: Negative for abdominal pain, blood in stool, constipation, heartburn, melena and vomiting.  Genitourinary: Negative for dysuria, frequency and urgency.  Musculoskeletal: Negative for back pain and joint pain.  Skin: Negative.  Negative for itching and rash.  Neurological: Negative for tingling, focal weakness, weakness and headaches.  Endo/Heme/Allergies: Does not bruise/bleed easily.  Psychiatric/Behavioral: Negative for depression. The patient is not nervous/anxious and does not have insomnia.       PAST MEDICAL HISTORY :  Past Medical History:  Diagnosis Date  . Anxiety   . Arthritis   . Breast cancer ([place]) 2000  . Cancer ([place]) left    breast cancer 2000, chemo tx's with total mastectomy and lymph nodes resected.   . Cancer of right lung ([place]) 02/21/2016   rad tx's.   . CHF (congestive heart failure) ([place])   . COPD (chronic obstructive pulmonary disease) ([place])   . Dependence on supplemental oxygen   . Depression   . Heart murmur   . Hypertension   . Lung nodule   . Lymphedema   . Personal history of chemotherapy   . Personal history of radiation therapy   . Shortness of breath dyspnea    with exertion  . Status post chemotherapy 2001   left breast cancer  . Status post radiation therapy 2001   left breast cancer    PAST SURGICAL HISTORY :   Past Surgical History:  Procedure Laterality Date  . Breast Biospy Left    [facility]  . BREAST SURGERY    . COLONOSCOPY N/A 04/30/2018   Procedure: COLONOSCOPY;  Surgeon: Virgel Manifold, MD;  Location: [facility] ENDOSCOPY;  Service: Endoscopy;  Laterality: N/A;  . COLONOSCOPY N/A 07/22/2018   Procedure: COLONOSCOPY;  Surgeon: Virgel Manifold, MD;  Location: [facility] ENDOSCOPY;  Service: Endoscopy;  Laterality: N/A;  .  DILATION AND CURETTAGE OF UTERUS    . ELECTROMAGNETIC NAVIGATION BROCHOSCOPY Right 04/11/2016   Procedure: ELECTROMAGNETIC NAVIGATION BRONCHOSCOPY;  Surgeon: Vilinda Boehringer, MD;  Location: [facility] ORS;  Service: Cardiopulmonary;  Laterality: Right;  . ESOPHAGOGASTRODUODENOSCOPY N/A 07/22/2018   Procedure: ESOPHAGOGASTRODUODENOSCOPY (EGD);  Surgeon: Virgel Manifold, MD;  Location: [facility] ENDOSCOPY;  Service: Endoscopy;  Laterality: N/A;  . ESOPHAGOGASTRODUODENOSCOPY (EGD) WITH PROPOFOL N/A 05/07/2018   Procedure: ESOPHAGOGASTRODUODENOSCOPY (EGD) WITH PROPOFOL;  Surgeon: Lucilla Lame, MD;  Location: [facility] ENDOSCOPY;  Service: Endoscopy;  Laterality: N/A;  . ESOPHAGOGASTRODUODENOSCOPY (EGD) WITH PROPOFOL N/A 04/24/2019   Procedure: ESOPHAGOGASTRODUODENOSCOPY (EGD) WITH PROPOFOL;  Surgeon: Jonathon Bellows, MD;  Location: [facility] ENDOSCOPY;  Service: Gastroenterology;  Laterality: N/A;  . ESOPHAGOGASTRODUODENOSCOPY (EGD) WITH PROPOFOL N/A 01/12/2020   Procedure: ESOPHAGOGASTRODUODENOSCOPY (EGD) WITH PROPOFOL;  Surgeon: Jonathon Bellows, MD;  Location: [facility] ENDOSCOPY;  Service: Gastroenterology;  Laterality: N/A;  . ESOPHAGOGASTRODUODENOSCOPY (EGD) WITH PROPOFOL N/A 04/28/2020   Procedure: ESOPHAGOGASTRODUODENOSCOPY (EGD) WITH PROPOFOL;  Surgeon: Jonathon Bellows, MD;  Location: [facility] ENDOSCOPY;  Service: Gastroenterology;  Laterality: N/A;  . EUS N/A 05/07/2019   Procedure: FULL UPPER ENDOSCOPIC ULTRASOUND (EUS) RADIAL;  Surgeon: Jola Schmidt, MD;  Location: [facility] ENDOSCOPY;  Service: Endoscopy;  Laterality: N/A;  . history of colonoscopy]    . ILEOSCOPY N/A 07/22/2018   Procedure: ILEOSCOPY THROUGH STOMA;  Surgeon: Virgel Manifold, MD;  Location: [place] ENDOSCOPY;  Service: Endoscopy;  Laterality: N/A;  . ILEOSTOMY    . ILEOSTOMY N/A 09/08/2018   Procedure: ILEOSTOMY REVISION POSSIBLE CREATION;  Surgeon: Carolan Shiver, MD;  Location: [facility] ORS;  Service: General;  Laterality: N/A;  . ILEOSTOMY CLOSURE N/A 08/15/2018    Procedure: DILATION OF ILEOSTOMY STRICTURE;  Surgeon: Carolan Shiver, MD;  Location: [facility] ORS;  Service: General;  Laterality: N/A;  . LAPAROTOMY Right 05/04/2018   Procedure: EXPLORATORY LAPAROTOMY right colectomy right and left ostomy;  Surgeon: Carolan Shiver, MD;  Location: [facility] ORS;  Service: General;  Laterality: Right;  . LUNG BIOPSY    . MASTECTOMY Left    2000, [facility]  . ROTATOR CUFF REPAIR Right    [facility]    FAMILY HISTORY :   Family History  Problem Relation Age of Onset  . Breast cancer Mother 66  . Cancer Mother        Breast   . Cirrhosis Father   . Breast cancer Paternal Aunt 36  . Cancer Maternal Aunt        Breast     SOCIAL HISTORY:   Social History   Tobacco Use  . Smoking status: Former Smoker    Packs/day: 0.50    Years: 20.00    Pack years: 10.00    Types: Cigarettes    Quit date: 07/02/2012    Years since quitting: 8.3  . Smokeless tobacco: Current User    Types: Snuff  . Tobacco comment: quit 2014  Vaping Use  . Vaping Use: Never used  Substance Use Topics  . Alcohol use: Not Currently    Comment: Occasionally  . Drug use: No    ALLERGIES:  has No Known Allergies.  MEDICATIONS:  Current Outpatient Medications  Medication Sig Dispense Refill  . acetaminophen (TYLENOL) 325 MG tablet Take 2 tablets (650 mg total) by mouth every 6 (six) hours as needed for mild pain (or Fever >/= 101).    [name] albuterol (PROVENTIL HFA;VENTOLIN HFA) 108 (90 Base) MCG/ACT inhaler Inhale 2 puffs into the lungs every 6 (six) hours as needed for wheezing or shortness of breath. 1 Inhaler 2  . calcium carbonate (OSCAL) 1500 (600 Ca) MG TABS tablet Take 1 tablet by mouth daily.    . carvedilol (COREG) 6.25 MG tablet Take 6.25 mg by mouth 2 (two) times daily.    . cholecalciferol (VITAMIN D3) 25 MCG (1000 UNIT) tablet Take 1,000 Units by mouth daily.    . citalopram (CELEXA) 40 MG tablet Take 40 mg by mouth daily.     [name] diltiazem (CARDIZEM CD) 180 MG 24 hr  capsule Take 1 capsule (180 mg total) by mouth daily. 30 capsule 2  . ferrous sulfate 325 (65 FE) MG tablet Take 1 tablet (325 mg total) by mouth 2 (two) times daily with a meal. 60 tablet 3  . Fluticasone-Umeclidin-Vilant 100-62.5-25 MCG/INH AEPB Inhale 1 puff into the lungs daily.     [name] loperamide (IMODIUM) 2 MG capsule Take 2 mg by mouth as needed for diarrhea or loose stools.    . Multiple Vitamin (MULTIVITAMIN WITH MINERALS) TABS tablet Take 1 tablet by mouth daily. 30 tablet 1  . Nutritional Supplements (FEEDING SUPPLEMENT, NEPRO CARB STEADY,) LIQD Take 237 mLs by mouth 3 (three) times daily between meals. 21330 mL 0  . pantoprazole (PROTONIX) 40 MG tablet Take 1 tablet (40 mg total) by mouth 2 (two) times daily before a meal. 60 tablet 2  . potassium chloride (KLOR-CON) 10 MEQ tablet Take 10 mEq  by mouth daily.    . sodium bicarbonate 650 MG tablet Take 2 tablets (1,300 mg total) by mouth 3 (three) times daily. 180 tablet 0  . sucralfate (CARAFATE) 1 GM/10ML suspension Take 10 mLs (1 g total) by mouth 4 (four) times daily. 1200 mL 2  . torsemide (DEMADEX) 20 MG tablet Take 1 tablet (20 mg total) by mouth every other day. 30 tablet 1   No current facility-administered medications for this visit.    PHYSICAL EXAMINATION: ECOG PERFORMANCE STATUS: 1 - Symptomatic but completely ambulatory  BP 112/78 (BP Location: Right Arm, Patient Position: Sitting, Cuff Size: Normal)   Pulse 90   Temp (!) 96.7 F (35.9 C) (Tympanic)   Resp 16   Ht 5\' 5"  (1.651 m)   Wt 123 lb (55.8 kg)   SpO2 100%   BMI 20.47 kg/m   Filed Weights   11/16/20 1018  Weight: 123 lb (55.8 kg)    Physical Exam Constitutional:      Comments: Thin built moderately nourished female patient.  She is walking herself.  She is alone.  HENT:     Head: Normocephalic and atraumatic.     Mouth/Throat:     Pharynx: No oropharyngeal exudate.  Eyes:     Pupils: Pupils are equal, round, and reactive to light.   Cardiovascular:     Rate and Rhythm: Normal rate and regular rhythm.  Pulmonary:     Effort: No respiratory distress.     Breath sounds: No wheezing.     Comments: Decreased breath sounds bilaterally.  Abdominal:     General: Bowel sounds are normal. There is no distension.     Palpations: Abdomen is soft. There is no mass.     Tenderness: There is no abdominal tenderness. There is no guarding or rebound.  Musculoskeletal:        General: No tenderness. Normal range of motion.     Cervical back: Normal range of motion and neck supple.     Comments: Left upper extremity-swollen/no warmth or tenderness.   Skin:    General: Skin is warm.  Neurological:     Mental Status: She is alert and oriented to person, place, and time.  Psychiatric:        Mood and Affect: Affect normal.     LABORATORY DATA:  I have reviewed the data as listed    Component Value Date/Time   NA 137 11/16/2020 0949   NA 132 (L) 02/09/2015 1100   K 4.4 11/16/2020 0949   K 3.8 02/09/2015 1100   CL 107 11/16/2020 0949   CL 95 (L) 02/09/2015 1100   CO2 19 (L) 11/16/2020 0949   CO2 29 02/09/2015 1100   GLUCOSE 84 11/16/2020 0949   GLUCOSE 105 (H) 02/09/2015 1100   BUN 59 (H) 11/16/2020 0949   BUN 16 02/09/2015 1100   CREATININE 2.38 (H) 11/16/2020 0949   CREATININE 0.81 02/09/2015 1100   CALCIUM 9.2 11/16/2020 0949   CALCIUM 9.1 02/09/2015 1100   PROT 8.5 (H) 11/05/2020 0053   PROT 7.7 02/09/2015 1100   ALBUMIN 4.2 11/05/2020 0053   ALBUMIN 4.3 02/09/2015 1100   AST 24 11/05/2020 0053   AST 29 02/09/2015 1100   ALT 15 11/05/2020 0053   ALT 20 02/09/2015 1100   ALKPHOS 70 11/05/2020 0053   ALKPHOS 69 02/09/2015 1100   BILITOT 0.8 11/05/2020 0053   BILITOT 0.9 02/09/2015 1100   GFRNONAA 22 (L) 11/16/2020 0949   GFRNONAA >60 02/09/2015 1100  GFRAA 37 (L) 06/29/2020 0930   GFRAA >60 02/09/2015 1100    No results found for: SPEP, UPEP  Lab Results  Component Value Date   WBC 4.2 11/16/2020    NEUTROABS 2.5 11/16/2020   HGB 10.1 (L) 11/16/2020   HCT 29.5 (L) 11/16/2020   MCV 97.4 11/16/2020   PLT 277 11/16/2020      Chemistry      Component Value Date/Time   NA 137 11/16/2020 0949   NA 132 (L) 02/09/2015 1100   K 4.4 11/16/2020 0949   K 3.8 02/09/2015 1100   CL 107 11/16/2020 0949   CL 95 (L) 02/09/2015 1100   CO2 19 (L) 11/16/2020 0949   CO2 29 02/09/2015 1100   BUN 59 (H) 11/16/2020 0949   BUN 16 02/09/2015 1100   CREATININE 2.38 (H) 11/16/2020 0949   CREATININE 0.81 02/09/2015 1100      Component Value Date/Time   CALCIUM 9.2 11/16/2020 0949   CALCIUM 9.1 02/09/2015 1100   ALKPHOS 70 11/05/2020 0053   ALKPHOS 69 02/09/2015 1100   AST 24 11/05/2020 0053   AST 29 02/09/2015 1100   ALT 15 11/05/2020 0053   ALT 20 02/09/2015 1100   BILITOT 0.8 11/05/2020 0053   BILITOT 0.9 02/09/2015 1100       RADIOGRAPHIC STUDIES: I have personally reviewed the radiological images as listed and agreed with the findings in the report. No results found.   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  Currently s/p definitive radiation [dec 7th 2020].  STABLE; Posttreatment PET scan February 2021-no evidence of any metastatic disease.  No uptake in the esophagus region. EGD July 15 th- NO evidence of recurrence. STABLE  Continue surveillance. Will discuss with [name].   #Severe anemia-likely secondary to CKD stage IV-currently on Retacrit; hemoglobin today is 10.1- HOLD  Retacrit today. STABLE.    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  STABLE;  Await MM labs from today.  #Weight loss/poor appetite- STABLE.   # DISPOSITION:  #  RETACRIT today # in 4 weeks- H&H- possible retacrit # in 8 weeks-  H&H- possible retacrit # follow up in 12 weeks- cbc/bmp/ possible Retacrit;-Dr.B  Orders Placed This Encounter  Procedures  . Hemoglobin [facility])    Standing Status:   Standing    Number of Occurrences:   2    Standing Expiration Date:   11/16/2021  . Hematocrit ([facility])    Standing Status:   Standing    Number of Occurrences:   2    Standing Expiration Date:   11/16/2021  . CBC with Differential    Standing Status:   Future    Standing Expiration Date:   11/16/2021  . Basic metabolic panel    Standing Status:   Future    Standing Expiration Date:   11/16/2021   All questions were answered. The patient knows to call the clinic with any problems, questions or concerns.      [name], MD 11/16/2020 11:26 AM

## 2020-11-16 NOTE — ED Notes (Signed)
Pt has left shoulder and left rib pain after falling tonight on the kitchen floor.  No loc.  No neck or back pain.  Pt alert  Speech clear.

## 2020-11-17 ENCOUNTER — Other Ambulatory Visit: Payer: Self-pay

## 2020-11-17 DIAGNOSIS — R55 Syncope and collapse: Secondary | ICD-10-CM | POA: Diagnosis present

## 2020-11-17 DIAGNOSIS — I959 Hypotension, unspecified: Secondary | ICD-10-CM

## 2020-11-17 DIAGNOSIS — M25512 Pain in left shoulder: Secondary | ICD-10-CM

## 2020-11-17 DIAGNOSIS — Z932 Ileostomy status: Secondary | ICD-10-CM

## 2020-11-17 LAB — CBC
HCT: 23.7 % — ABNORMAL LOW (ref 36.0–46.0)
Hemoglobin: 7.9 g/dL — ABNORMAL LOW (ref 12.0–15.0)
MCH: 33.2 pg (ref 26.0–34.0)
MCHC: 33.3 g/dL (ref 30.0–36.0)
MCV: 99.6 fL (ref 80.0–100.0)
Platelets: 256 10*3/uL (ref 150–400)
RBC: 2.38 MIL/uL — ABNORMAL LOW (ref 3.87–5.11)
RDW: 13.4 % (ref 11.5–15.5)
WBC: 6.9 10*3/uL (ref 4.0–10.5)
nRBC: 0 % (ref 0.0–0.2)

## 2020-11-17 LAB — COMPREHENSIVE METABOLIC PANEL
ALT: 12 U/L (ref 0–44)
AST: 22 U/L (ref 15–41)
Albumin: 3.9 g/dL (ref 3.5–5.0)
Alkaline Phosphatase: 54 U/L (ref 38–126)
Anion gap: 11 (ref 5–15)
BUN: 57 mg/dL — ABNORMAL HIGH (ref 8–23)
CO2: 18 mmol/L — ABNORMAL LOW (ref 22–32)
Calcium: 9.3 mg/dL (ref 8.9–10.3)
Chloride: 105 mmol/L (ref 98–111)
Creatinine, Ser: 2.07 mg/dL — ABNORMAL HIGH (ref 0.44–1.00)
GFR, Estimated: 26 mL/min — ABNORMAL LOW (ref >60)
Glucose, Bld: 83 mg/dL (ref 70–99)
Potassium: 4 mmol/L (ref 3.5–5.1)
Sodium: 134 mmol/L — ABNORMAL LOW (ref 135–145)
Total Bilirubin: 0.6 mg/dL (ref 0.3–1.2)
Total Protein: 7.5 g/dL (ref 6.5–8.1)

## 2020-11-17 LAB — CBC WITH DIFFERENTIAL/PLATELET
Abs Immature Granulocytes: 0.02 10*3/uL (ref 0.00–0.07)
Basophils Absolute: 0.1 10*3/uL (ref 0.0–0.1)
Basophils Relative: 1 %
Eosinophils Absolute: 0.1 10*3/uL (ref 0.0–0.5)
Eosinophils Relative: 2 %
HCT: 29.5 % — ABNORMAL LOW (ref 36.0–46.0)
Hemoglobin: 9.8 g/dL — ABNORMAL LOW (ref 12.0–15.0)
Immature Granulocytes: 0 %
Lymphocytes Relative: 27 %
Lymphs Abs: 1.8 10*3/uL (ref 0.7–4.0)
MCH: 32.7 pg (ref 26.0–34.0)
MCHC: 33.2 g/dL (ref 30.0–36.0)
MCV: 98.3 fL (ref 80.0–100.0)
Monocytes Absolute: 0.5 10*3/uL (ref 0.1–1.0)
Monocytes Relative: 8 %
Neutro Abs: 4.2 10*3/uL (ref 1.7–7.7)
Neutrophils Relative %: 62 %
Platelets: 305 10*3/uL (ref 150–400)
RBC: 3 MIL/uL — ABNORMAL LOW (ref 3.87–5.11)
RDW: 13.1 % (ref 11.5–15.5)
WBC: 6.8 10*3/uL (ref 4.0–10.5)
nRBC: 0 % (ref 0.0–0.2)

## 2020-11-17 LAB — SARS CORONAVIRUS 2 (TAT 6-24 HRS): SARS Coronavirus 2: NEGATIVE

## 2020-11-17 LAB — CREATININE, SERUM
Creatinine, Ser: 2.16 mg/dL — ABNORMAL HIGH (ref 0.44–1.00)
GFR, Estimated: 25 mL/min — ABNORMAL LOW (ref >60)

## 2020-11-17 LAB — TROPONIN I (HIGH SENSITIVITY)
Troponin I (High Sensitivity): 18 ng/L — ABNORMAL HIGH (ref <18)
Troponin I (High Sensitivity): 15 ng/L (ref <18)

## 2020-11-17 LAB — CBG MONITORING, ED: Glucose-Capillary: 73 mg/dL (ref 70–99)

## 2020-11-17 MED ORDER — HEPARIN SODIUM (PORCINE) 5000 UNIT/ML IJ SOLN
5000.0000 [IU] | Freq: Three times a day (TID) | INTRAMUSCULAR | Status: DC
  Administered 2020-11-17 – 2020-11-18 (×4): 5000 [IU] via SUBCUTANEOUS
  Filled 2020-11-17 (×4): qty 1

## 2020-11-17 MED ORDER — SODIUM CHLORIDE 0.9% FLUSH
3.0000 mL | Freq: Two times a day (BID) | INTRAVENOUS | Status: DC
  Administered 2020-11-17: 3 mL via INTRAVENOUS

## 2020-11-17 MED ORDER — MORPHINE SULFATE (PF) 2 MG/ML IV SOLN
2.0000 mg | INTRAVENOUS | Status: DC | PRN
  Administered 2020-11-17: 2 mg via INTRAVENOUS
  Filled 2020-11-17: qty 1

## 2020-11-17 MED ORDER — ACETAMINOPHEN 325 MG PO TABS
650.0000 mg | ORAL_TABLET | Freq: Four times a day (QID) | ORAL | Status: DC | PRN
  Administered 2020-11-17 – 2020-11-18 (×2): 650 mg via ORAL
  Filled 2020-11-17 (×2): qty 2

## 2020-11-17 MED ORDER — OXYCODONE HCL 5 MG PO TABS
5.0000 mg | ORAL_TABLET | ORAL | Status: DC | PRN
  Administered 2020-11-17 – 2020-11-18 (×2): 5 mg via ORAL
  Filled 2020-11-17 (×2): qty 1

## 2020-11-17 MED ORDER — ONDANSETRON HCL 4 MG/2ML IJ SOLN: 4.0000 mg | Freq: Four times a day (QID) | INTRAMUSCULAR | Status: DC | PRN

## 2020-11-17 MED ORDER — ACETAMINOPHEN 650 MG RE SUPP: 650.0000 mg | Freq: Four times a day (QID) | RECTAL | Status: DC | PRN

## 2020-11-17 MED ORDER — OXYCODONE HCL 5 MG PO TABS
5.0000 mg | ORAL_TABLET | Freq: Once | ORAL | Status: AC
Start: 2020-11-17 — End: 2020-11-17
  Administered 2020-11-17: 5 mg via ORAL
  Filled 2020-11-17: qty 1

## 2020-11-17 MED ORDER — ONDANSETRON HCL 4 MG PO TABS: 4.0000 mg | ORAL_TABLET | Freq: Four times a day (QID) | ORAL | Status: DC | PRN

## 2020-11-17 MED ORDER — ALBUTEROL SULFATE HFA 108 (90 BASE) MCG/ACT IN AERS
2.0000 | INHALATION_SPRAY | Freq: Four times a day (QID) | RESPIRATORY_TRACT | Status: DC | PRN
  Filled 2020-11-17: qty 6.7

## 2020-11-17 MED ORDER — LACTATED RINGERS IV BOLUS
1000.0000 mL | Freq: Once | INTRAVENOUS | Status: AC
  Administered 2020-11-17: 1000 mL via INTRAVENOUS

## 2020-11-17 MED ORDER — LACTATED RINGERS IV SOLN: INTRAVENOUS | Status: AC

## 2020-11-17 NOTE — ED Notes (Signed)
Pt meal tray at bedside. Pt is eating at this time.

## 2020-11-17 NOTE — ED Notes (Addendum)
Pt's meal tray set up for pt. Pt able to feed self. Pt encouraged to eat. Pt tolerating well. Pt sats decreased to 86% on room air for a short time when pt helped to sit up in bed. Pt put on 1 L [place], pt sats 97% on 1 L [place]. Pt in NAD.

## 2020-11-17 NOTE — ED Notes (Signed)
Pt continues to rest in bed- pt denies needs at this time

## 2020-11-17 NOTE — H&P (Signed)
History and Physical    Danielle [name] JHE:[id number] DOB: 06/18/1956 DOA: 11/16/2020  PCP: [facility]   Patient coming from: Home  I have personally briefly reviewed patient's old medical records in [place]  Chief Complaint: Fall, brief LOC  HPI: Danielle [name] is a 65 y.o. female with medical history significant for Breast,  and esophageal cancer, paroxysmal A. fib not on anticoagulation due to history of GI bleed, CKD4 and anemia of CKD, ileostomy status with recent hospitalization January 2022 for SBO, HTN, systolic heart failure, presenting with a syncopal event.  Patient states that for the past several weeks she has had very little appetite eating 1 meal a day and often feels 'swimmy' in the head, especially when she gets up suddenly and often falls back down into the chair.  Today, as she walked into the kitchen the next thing she woke up on the floor with pain in her left shoulder left rib cage.  She had no immediately preceding symptoms such as lightheadedness, dizziness, visual disturbance, one-sided weakness numbness or tingling, headache, chest pain, palpitations or shortness of breath.  Since her discharge for SBO on 1/27, she had been back to her usual baseline, denying any recent vomiting or increased ileostomy output.  She denies cough, fever or shortness of breath.  Was seen by her oncologist earlier in the day.  She was alert and oriented on arrival with only complaint being left shoulder pain and left-sided chest wall pain. ED course: On arrival, vitals were normal except for soft blood pressure of 101/62 with blood pressure falling as low as 89/60 after her first or in the emergency room.  Blood work was for the most part at baseline with hemoglobin of 9.8 which is around her baseline of 10.1 done earlier with oncology.  Creatinine was 2.07, improved from 2.38 baseline done earlier in the day at her oncology appointment.  Troponin 18,15. EKG as  interpreted by me: Sinus rhythm at 72 with no acute ST-T wave changes Imaging: Head CT normal, chest x-ray with rib series showing no rib fracture and unchanged nodular density right upper lobe, left shoulder x-ray showing no bony injury   Patient received an IV fluid bolus in the emergency room with improvement in blood pressure.  Hospitalist consulted for admission.  Review of Systems: As per HPI otherwise all other systems on review of systems negative.    Past Medical History:  Diagnosis Date  . Anxiety   . Arthritis   . Breast cancer ([place]) 2000  . Cancer ([place]) left    breast cancer 2000, chemo tx's with total mastectomy and lymph nodes resected.   . Cancer of right lung ([place]) 02/21/2016   rad tx's.   . CHF (congestive heart failure) ([place])   . COPD (chronic obstructive pulmonary disease) ([place])   . Dependence on supplemental oxygen   . Depression   . Heart murmur   . Hypertension   . Lung nodule   . Lymphedema   . Personal history of chemotherapy   . Personal history of radiation therapy   . Shortness of breath dyspnea    with exertion  . Status post chemotherapy 2001   left breast cancer  . Status post radiation therapy 2001   left breast cancer    Past Surgical History:  Procedure Laterality Date  . Breast Biospy Left    [facility]  . BREAST SURGERY    . COLONOSCOPY N/A 04/30/2018   Procedure: COLONOSCOPY;  Surgeon: Virgel Manifold, MD;  Location: [facility] ENDOSCOPY;  Service: Endoscopy;  Laterality: N/A;  . COLONOSCOPY N/A 07/22/2018   Procedure: COLONOSCOPY;  Surgeon: Virgel Manifold, MD;  Location: [facility] ENDOSCOPY;  Service: Endoscopy;  Laterality: N/A;  . DILATION AND CURETTAGE OF UTERUS    . ELECTROMAGNETIC NAVIGATION BROCHOSCOPY Right 04/11/2016   Procedure: ELECTROMAGNETIC NAVIGATION BRONCHOSCOPY;  Surgeon: Vilinda Boehringer, MD;  Location: [facility] ORS;  Service: Cardiopulmonary;  Laterality: Right;  . ESOPHAGOGASTRODUODENOSCOPY N/A 07/22/2018   Procedure:  ESOPHAGOGASTRODUODENOSCOPY (EGD);  Surgeon: Virgel Manifold, MD;  Location[facility] ENDOSCOPY;  Service: Endoscopy;  Laterality: N/A;  . ESOPHAGOGASTRODUODENOSCOPY (EGD) WITH PROPOFOL N/A 05/07/2018   Procedure: ESOPHAGOGASTRODUODENOSCOPY (EGD) WITH PROPOFOL;  Surgeon: Lucilla Lame, MD;  Location: [facility] ENDOSCOPY;  Service: Endoscopy;  Laterality: N/A;  . ESOPHAGOGASTRODUODENOSCOPY (EGD) WITH PROPOFOL N/A 04/24/2019   Procedure: ESOPHAGOGASTRODUODENOSCOPY (EGD) WITH PROPOFOL;  Surgeon: Jonathon Bellows, MD;  Location: [facility] ENDOSCOPY;  Service: Gastroenterology;  Laterality: N/A;  . ESOPHAGOGASTRODUODENOSCOPY (EGD) WITH PROPOFOL N/A 01/12/2020   Procedure: ESOPHAGOGASTRODUODENOSCOPY (EGD) WITH PROPOFOL;  Surgeon: Jonathon Bellows, MD;  Location: [facility] ENDOSCOPY;  Service: Gastroenterology;  Laterality: N/A;  . ESOPHAGOGASTRODUODENOSCOPY (EGD) WITH PROPOFOL N/A 04/28/2020   Procedure: ESOPHAGOGASTRODUODENOSCOPY (EGD) WITH PROPOFOL;  Surgeon: Jonathon Bellows, MD;  Location: [facility] ENDOSCOPY;  Service: Gastroenterology;  Laterality: N/A;  . EUS N/A 05/07/2019   Procedure: FULL UPPER ENDOSCOPIC ULTRASOUND (EUS) RADIAL;  Surgeon: Jola Schmidt, MD;  Location: [facility] ENDOSCOPY;  Service: Endoscopy;  Laterality: N/A;  . history of colonoscopy]    . ILEOSCOPY N/A 07/22/2018   Procedure: ILEOSCOPY THROUGH STOMA;  Surgeon: Virgel Manifold, MD;  Location: [facility] ENDOSCOPY;  Service: Endoscopy;  Laterality: N/A;  . ILEOSTOMY    . ILEOSTOMY N/A 09/08/2018   Procedure: ILEOSTOMY REVISION POSSIBLE CREATION;  Surgeon: Herbert Pun, MD;  Location: [facility] ORS;  Service: General;  Laterality: N/A;  . ILEOSTOMY CLOSURE N/A 08/15/2018   Procedure: DILATION OF ILEOSTOMY STRICTURE;  Surgeon: Herbert Pun, MD;  Location: [facility] ORS;  Service: General;  Laterality: N/A;  . LAPAROTOMY Right 05/04/2018   Procedure: EXPLORATORY LAPAROTOMY right colectomy right and left ostomy;  Surgeon: Herbert Pun, MD;  Location: [facility] ORS;   Service: General;  Laterality: Right;  . LUNG BIOPSY    . MASTECTOMY Left    2000, [facility]  . ROTATOR CUFF REPAIR Right    [facility]     reports that she quit smoking about 8 years ago. Her smoking use included cigarettes. She has a 10.00 pack-year smoking history. Her smokeless tobacco use includes snuff. She reports previous alcohol use. She reports that she does not use drugs.  No Known Allergies  Family History  Problem Relation Age of Onset  . Breast cancer Mother 57  . Cancer Mother        Breast   . Cirrhosis Father   . Breast cancer Paternal Aunt 36  . Cancer Maternal Aunt        Breast       Prior to Admission medications   Medication Sig Start Date End Date Taking? Authorizing Provider  acetaminophen (TYLENOL) 325 MG tablet Take 2 tablets (650 mg total) by mouth every 6 (six) hours as needed for mild pain (or Fever >/= 101). 12/24/18  Yes Gouru, Illene Silver, MD  albuterol (PROVENTIL HFA;VENTOLIN HFA) 108 (90 Base) MCG/ACT inhaler Inhale 2 puffs into the lungs every 6 (six) hours as needed for wheezing or shortness of breath. 04/24/18  Yes Demetrios Loll, MD  calcium carbonate (OSCAL) 1500 (600 Ca)  MG TABS tablet Take 1 tablet by mouth daily.   Yes [provider]  carvedilol (COREG) 6.25 MG tablet Take 6.25 mg by mouth 2 (two) times daily. 10/27/20  Yes [provider]  cholecalciferol (VITAMIN D3) 25 MCG (1000 UNIT) tablet Take 1,000 Units by mouth daily.   Yes [provider]  citalopram (CELEXA) 40 MG tablet Take 40 mg by mouth daily.    Yes [provider]  diltiazem (CARDIZEM CD) 180 MG 24 hr capsule Take 1 capsule (180 mg total) by mouth daily. 08/06/20  Yes Pokhrel, Laxman, MD  ferrous sulfate 325 (65 FE) MG tablet Take 1 tablet (325 mg total) by mouth 2 (two) times daily with a meal. 07/02/18  Yes [name], MD  Fluticasone-Umeclidin-Vilant 100-62.5-25 MCG/INH AEPB Inhale 1 puff into the lungs daily.  01/07/19  Yes [provider]   Multiple Vitamin (MULTIVITAMIN WITH MINERALS) TABS tablet Take 1 tablet by mouth daily. 06/06/18  Yes Fritzi Mandes, MD  pantoprazole (PROTONIX) 40 MG tablet Take 1 tablet (40 mg total) by mouth 2 (two) times daily before a meal. 08/05/20  Yes Pokhrel, Laxman, MD  sodium bicarbonate 650 MG tablet Take 2 tablets (1,300 mg total) by mouth 3 (three) times daily. 10/21/19  Yes Wieting, Richard, MD  sucralfate (CARAFATE) 1 GM/10ML suspension Take 10 mLs (1 g total) by mouth 4 (four) times daily. 08/05/20  Yes Pokhrel, Laxman, MD  torsemide (DEMADEX) 20 MG tablet Take 1 tablet (20 mg total) by mouth every other day. 08/05/20  Yes Pokhrel, Laxman, MD  loperamide (IMODIUM) 2 MG capsule Take 2 mg by mouth as needed for diarrhea or loose stools. Patient not taking: Reported on 11/17/2020    [provider]  Nutritional Supplements (FEEDING SUPPLEMENT, NEPRO CARB STEADY,) LIQD Take 237 mLs by mouth 3 (three) times daily between meals. 10/21/19   Loletha Grayer, MD  potassium chloride (KLOR-CON) 10 MEQ tablet Take 10 mEq by mouth daily. Patient not taking: Reported on 11/17/2020 08/19/20 08/19/21  [provider]    Physical Exam: Vitals:   11/17/20 0200 11/17/20 0230 11/17/20 0350 11/17/20 0400  BP: 91/61 92/61 92/66  105/69  Pulse: 82 84 78 75  Resp: 15 17 16 17   Temp:      TempSrc:      SpO2: 98% 96% 95% 97%  Weight:      Height:         Vitals:   11/17/20 0200 11/17/20 0230 11/17/20 0350 11/17/20 0400  BP: 91/61 92/61 92/66  105/69  Pulse: 82 84 78 75  Resp: 15 17 16 17   Temp:      TempSrc:      SpO2: 98% 96% 95% 97%  Weight:      Height:          Constitutional: Thin, frail-appearing, Alert and oriented x 3 . Not in any apparent distress HEENT:      Head: Normocephalic and atraumatic.         Eyes: PERLA, EOMI, Conjunctivae are normal. Sclera is non-icteric.       Mouth/Throat: Mucous membranes are moist.       Neck: Supple with no signs of meningismus. Cardiovascular:  Regular rate and rhythm. No murmurs, gallops, or rubs. 2+ symmetrical distal pulses are present . No JVD. No LE edema Respiratory: Respiratory effort normal .Lungs sounds clear bilaterally. No wheezes, crackles, or rhonchi. Chest wall tender to palpation left lateral, no bruising Gastrointestinal: Soft, non tender, and non distended with positive bowel  sounds.  Genitourinary: No CVA tenderness. Musculoskeletal: pain on palpation anterior left shoulder with normal range of motion in all extremities. No cyanosis, or erythema of extremities. Neurologic:  Face is symmetric. Moving all extremities. No gross focal neurologic deficits . Skin: Skin is warm, dry.  No rash or ulcers Psychiatric: Mood and affect are normal    Labs on Admission: I have personally reviewed following labs and imaging studies  CBC: Recent Labs  Lab 11/10/20 0517 11/16/20 0949 11/17/20 0038  WBC 3.9* 4.2 6.8  NEUTROABS  --  2.5 4.2  HGB 8.9* 10.1* 9.8*  HCT 27.6* 29.5* 29.5*  MCV 103.8* 97.4 98.3  PLT 229 277 509   Basic Metabolic Panel: Recent Labs  Lab 11/10/20 0517 11/16/20 0949 11/17/20 0038  NA 139 137 134*  K 3.9 4.4 4.0  CL 111 107 105  CO2 21* 19* 18*  GLUCOSE 92 84 83  BUN 14 59* 57*  CREATININE 2.02* 2.38* 2.07*  CALCIUM 9.0 9.2 9.3  MG 1.5*  --   --   PHOS 3.7  --   --    GFR: Estimated Creatinine Clearance: 23.7 mL/min (A) (by C-G formula based on SCr of 2.07 mg/dL (H)). Liver Function Tests: Recent Labs  Lab 11/17/20 0038  AST 22  ALT 12  ALKPHOS 54  BILITOT 0.6  PROT 7.5  ALBUMIN 3.9   No results for input(s): LIPASE, AMYLASE in the last 168 hours. No results for input(s): AMMONIA in the last 168 hours. Coagulation Profile: No results for input(s): INR, PROTIME in the last 168 hours. Cardiac Enzymes: No results for input(s): CKTOTAL, CKMB, CKMBINDEX, TROPONINI in the last 168 hours. BNP (last 3 results) No results for input(s): PROBNP in the last 8760 hours. HbA1C: No  results for input(s): HGBA1C in the last 72 hours. CBG: No results for input(s): GLUCAP in the last 168 hours. Lipid Profile: No results for input(s): CHOL, HDL, LDLCALC, TRIG, CHOLHDL, LDLDIRECT in the last 72 hours. Thyroid Function Tests: No results for input(s): TSH, T4TOTAL, FREET4, T3FREE, THYROIDAB in the last 72 hours. Anemia Panel: No results for input(s): VITAMINB12, FOLATE, FERRITIN, TIBC, IRON, RETICCTPCT in the last 72 hours. Urine analysis:    Component Value Date/Time   COLORURINE YELLOW (A) 01/20/2020 0821   APPEARANCEUR CLEAR (A) 01/20/2020 0821   APPEARANCEUR Clear 12/06/2013 2245   LABSPEC 1.009 01/20/2020 0821   LABSPEC 1.015 12/06/2013 2245   PHURINE 5.0 01/20/2020 0821   GLUCOSEU NEGATIVE 01/20/2020 0821   GLUCOSEU Negative 12/06/2013 2245   HGBUR NEGATIVE 01/20/2020 0821   BILIRUBINUR NEGATIVE 01/20/2020 0821   BILIRUBINUR Negative 12/06/2013 2245   KETONESUR 5 (A) 01/20/2020 0821   PROTEINUR NEGATIVE 01/20/2020 0821   NITRITE NEGATIVE 01/20/2020 0821   LEUKOCYTESUR NEGATIVE 01/20/2020 0821   LEUKOCYTESUR Negative 12/06/2013 2245    Radiological Exams on Admission: DG Ribs Unilateral W/Chest Left  Result Date: 11/17/2020 CLINICAL DATA:  Fall EXAM: LEFT RIBS AND CHEST - 3+ VIEW COMPARISON:  07/31/2020, 01/20/2020 FINDINGS: Unchanged nodular density in the right upper lobe. Cardiomediastinal contours are normal. Calcific atherosclerosis. No rib fracture. IMPRESSION: No rib fracture. Electronically Signed   By: Ulyses Jarred M.D.   On: 11/17/2020 00:12   CT Head Wo Contrast  Result Date: 11/16/2020 CLINICAL DATA:  Fall EXAM: CT HEAD WITHOUT CONTRAST TECHNIQUE: Contiguous axial images were obtained from the base of the skull through the vertex without intravenous contrast. COMPARISON:  06/05/2020 FINDINGS: Brain: There is no mass, hemorrhage or extra-axial collection. The  size and configuration of the ventricles and extra-axial CSF spaces are normal. The brain  parenchyma is normal, without acute or chronic infarction. Vascular: No abnormal hyperdensity of the major intracranial arteries or dural venous sinuses. No intracranial atherosclerosis. Skull: The visualized skull base, calvarium and extracranial soft tissues are normal. Sinuses/Orbits: No fluid levels or advanced mucosal thickening of the visualized paranasal sinuses. No mastoid or middle ear effusion. The orbits are normal. IMPRESSION: Normal head CT. Electronically Signed   By: Ulyses Jarred M.D.   On: 11/16/2020 23:45   DG Shoulder Left  Result Date: 11/17/2020 CLINICAL DATA:  Fall EXAM: LEFT SHOULDER - 2+ VIEW COMPARISON:  None. FINDINGS: No fracture of the proximal left humerus. Ossific irregularity at the most lateral aspect of the left clavicle is favored to be tendinous calcification. No dislocation. IMPRESSION: 1. No fracture or dislocation of the proximal left humerus. 2. Ossific irregularity at the most lateral aspect of the left clavicle is favored to be tendinous calcification. Electronically Signed   By: Ulyses Jarred M.D.   On: 11/17/2020 00:09     Assessment/Plan 65 year old female with history of breast,  and esophageal cancer, paroxysmal A. fib not on anticoagulation due to history of GI bleed, CKD4 and anemia of CKD, ileostomy status with recent hospitalization January 2022 for SBO, HTN, systolic heart failure, presenting with a syncopal episode with fall.Hypotensive in the ER, 89/60    Syncope and collapse   Hypotension, suspect orthostatic -Suspect syncope related to Orthostatic hypotension -Suspect dehydration given baseline poor oral intake as well as recent hospitalization for SBO, in combination with home diuretic therapy - Does not meet sepsis criteria.  No fever tachycardia leukocytosis or other stigmata of infection but continue to monitor -Patient received IV bolus in the emergency room -Continue IV hydration x1 L with close monitoring for fluid overload in view of  history of systolic heart failure -Continuous cardiac monitoring -Consider cardiology consult  Left shoulder pain secondary to fall -Pain control -Continue sling with Ortho outpatient follow-up  Protein calorie malnutrition, suspect moderate to severe -Patient reports poor oral intake, weight loss over the past several weeks to months -Likely related to malignancies -Nutritionist evaluation    Chronic systolic heart failure ([place]) -Last EF on 08/23/2020 was 50% -Appears euvolemic -Hold carvedilol, torsemide for now due to hypotension.  Not on ACE/ARB likely related to kidney function  Mild aortic stenosis - echo 08/23/2020 and follows with cardiologist Dr. [name]    HTN (hypertension) -Holding carvedilol and torsemide  AF (paroxysmal atrial fibrillation) (HCC) -Currently in sinus -Not on systemic anticoagulation due to history of GI bleed    Breast cancer in female [facility])   Lung nodule, solitary    Malignant neoplasm of lower third of esophagus ([place]) -Followed by Dr. Rogue Bussing, last seen on 11/16/2020    Anemia of chronic kidney failure, stage 4 (severe) (HCC)   CKD (chronic kidney disease), stage IV ([place]) -Renal function at baseline.  Continue to monitor -Continue sodium bicarb, calcium carbonate    Ileostomy status ([facility]) -Ileostomy care    DVT prophylaxis: Heparin Code Status: full code  Family Communication:  none  Disposition Plan: Back to previous home environment Consults called: none  Status: Observation    Athena Masse MD [facility]     11/17/2020, 4:33 AM

## 2020-11-17 NOTE — ED Notes (Signed)
Pt meal tray placed on bedside table- pt resting with eyes closed and even respirations

## 2020-11-17 NOTE — ED Notes (Signed)
Patient remains in radiology completing ordered scans.

## 2020-11-17 NOTE — Progress Notes (Signed)
Assuming care of this 65 yo hx breast and esophageal cancer (no current treatment), p-af not anticoagulated given hx of GIB, ckd 3/4, anemia, ileostomy, recent hospitalization for SBO that resolved spontaneously, who presents with syncopal event. Appears to be orthostatic syncope as happened shortly after standing and has a history of lightheadedness w/ standing. W/u and chart reviewed. BPs 100/80s. EKG unremarkable and no events on tele. Labs appear stable. Is on a BB, CCB, and diuretic at home; these meds along with recent hospitalization for SBO and decreased PO likely responsible. Was admitted just a few hours ago so plan for today will be continue gentle hydration, continuous cardiac monitoring, continue to hold home coreg/dilt/torsemide, and will repeat labs in the morning. This morning complained of trouble urinating, nursing to bladder scan. Likely home tomorrow with close cardiology f/u.

## 2020-11-17 NOTE — ED Notes (Addendum)
Pt bed is ready. Waiting for the purple man to pop up so that I can message the IP nurse assigned to the pt.

## 2020-11-17 NOTE — ED Notes (Signed)
Patient to CT.

## 2020-11-17 NOTE — ED Notes (Signed)
Report off to mimi rn  

## 2020-11-17 NOTE — ED Notes (Signed)
Resumed care from [employer].  Pt alert.  Waiting on bed assignment.  Iv infusing  purewick in place.  Pt denies pain.

## 2020-11-17 NOTE — ED Notes (Signed)
Dr Annia Friendly at bedside- pt placed on cardiac monitoring by this RN

## 2020-11-18 DIAGNOSIS — R55 Syncope and collapse: Secondary | ICD-10-CM | POA: Diagnosis not present

## 2020-11-18 LAB — BASIC METABOLIC PANEL
Anion gap: 11 (ref 5–15)
BUN: 48 mg/dL — ABNORMAL HIGH (ref 8–23)
CO2: 19 mmol/L — ABNORMAL LOW (ref 22–32)
Calcium: 8.9 mg/dL (ref 8.9–10.3)
Chloride: 106 mmol/L (ref 98–111)
Creatinine, Ser: 1.99 mg/dL — ABNORMAL HIGH (ref 0.44–1.00)
GFR, Estimated: 27 mL/min — ABNORMAL LOW (ref >60)
Glucose, Bld: 74 mg/dL (ref 70–99)
Potassium: 4.1 mmol/L (ref 3.5–5.1)
Sodium: 136 mmol/L (ref 135–145)

## 2020-11-18 LAB — MULTIPLE MYELOMA PANEL, SERUM
Albumin SerPl Elph-Mcnc: 4 g/dL (ref 2.9–4.4)
Albumin/Glob SerPl: 1.3 (ref 0.7–1.7)
Alpha 1: 0.2 g/dL (ref 0.0–0.4)
Alpha2 Glob SerPl Elph-Mcnc: 0.9 g/dL (ref 0.4–1.0)
B-Globulin SerPl Elph-Mcnc: 1 g/dL (ref 0.7–1.3)
Gamma Glob SerPl Elph-Mcnc: 1.2 g/dL (ref 0.4–1.8)
Globulin, Total: 3.3 g/dL (ref 2.2–3.9)
IgA: 172 mg/dL (ref 87–352)
IgG (Immunoglobin G), Serum: 1281 mg/dL (ref 586–1602)
IgM (Immunoglobulin M), Srm: 29 mg/dL (ref 26–217)
M Protein SerPl Elph-Mcnc: 0.7 g/dL — ABNORMAL HIGH
Total Protein ELP: 7.3 g/dL (ref 6.0–8.5)

## 2020-11-18 LAB — CBC
HCT: 25.6 % — ABNORMAL LOW (ref 36.0–46.0)
Hemoglobin: 8.6 g/dL — ABNORMAL LOW (ref 12.0–15.0)
MCH: 33.1 pg (ref 26.0–34.0)
MCHC: 33.6 g/dL (ref 30.0–36.0)
MCV: 98.5 fL (ref 80.0–100.0)
Platelets: 275 10*3/uL (ref 150–400)
RBC: 2.6 MIL/uL — ABNORMAL LOW (ref 3.87–5.11)
RDW: 13.2 % (ref 11.5–15.5)
WBC: 5.1 10*3/uL (ref 4.0–10.5)
nRBC: 0 % (ref 0.0–0.2)

## 2020-11-18 LAB — KAPPA/LAMBDA LIGHT CHAINS
Kappa free light chain: 39.2 mg/L — ABNORMAL HIGH (ref 3.3–19.4)
Kappa, lambda light chain ratio: 0.51 (ref 0.26–1.65)
Lambda free light chains: 76.8 mg/L — ABNORMAL HIGH (ref 5.7–26.3)

## 2020-11-18 LAB — MAGNESIUM: Magnesium: 1.6 mg/dL — ABNORMAL LOW (ref 1.7–2.4)

## 2020-11-18 NOTE — Discharge Summary (Signed)
Danielle [name]:[id number] DOB: 01-02-1956 DOA: 11/16/2020  PCP: [facility] date: 11/16/2020 Discharge date: 11/18/2020  Time spent: 40 minutes  Recommendations for Outpatient Follow-up:  1. Close f/u with cardiology and pcp    Discharge Diagnoses:  Principal Problem:   Syncope and collapse Active Problems:   Breast cancer in female [facility])   Lung nodule, solitary   Chronic systolic heart failure (HCC)   HTN (hypertension)   AF (paroxysmal atrial fibrillation) (HCC)   Malignant neoplasm of lower third of esophagus (HCC)   Anemia of chronic kidney failure, stage 4 (severe) (HCC)   CKD (chronic kidney disease), stage IV (HCC)   Hypotension   Ileostomy status (HCC)   Acute shoulder pain due to trauma, left   Syncope   Discharge Condition: fair  Diet recommendation: regular  Filed Weights   11/16/20 2247  Weight: 56.2 kg    History of present illness:  Danielle [name] is a 65 y.o. female with medical history significant for Breast,  and esophageal cancer, paroxysmal A. fib not on anticoagulation due to history of GI bleed, CKD4 and anemia of CKD, ileostomy status with recent hospitalization January 2022 for SBO, HTN, systolic heart failure, presenting with a syncopal event.  Patient states that for the past several weeks she has had very little appetite eating 1 meal a day and often feels 'swimmy' in the head, especially when she gets up suddenly and often falls back down into the chair.  Today, as she walked into the kitchen the next thing she woke up on the floor with pain in her left shoulder left rib cage.  She had no immediately preceding symptoms such as lightheadedness, dizziness, visual disturbance, one-sided weakness numbness or tingling, headache, chest pain, palpitations or shortness of breath.  Since her discharge for SBO on 1/27, she had been back to her usual baseline, denying any recent vomiting or increased ileostomy output.  She denies  cough, fever or shortness of breath.  Was seen by her oncologist earlier in the day.  She was alert and oriented on arrival with only complaint being left shoulder pain and left-sided chest wall pain. ED course: On arrival, vitals were normal except for soft blood pressure of 101/62 with blood pressure falling as low as 89/60 after her first or in the emergency room.  Blood work was for the most part at baseline with hemoglobin of 9.8 which is around her baseline of 10.1 done earlier with oncology.  Creatinine was 2.07, improved from 2.38 baseline done earlier in the day at her oncology appointment.  Troponin 18,15. EKG as interpreted by me: Sinus rhythm at 72 with no acute ST-T wave changes Imaging: Head CT normal, chest x-ray with rib series showing no rib fracture and unchanged nodular density right upper lobe, left shoulder x-ray showing no bony injury   Patient received an IV fluid bolus in the emergency room with improvement in blood pressure.  Hospital Course:  Syncope and collapse Hypotension Orthostasis This appears to be orthostasis as symptoms are suggestive and orthostatic vital signs are positive. Patient reports decrease PO as of late. Electrolytes and hgb are at baseline. Recent hospitalization for sbo but tolerating food, ostomy output normal. Monitored on telemetry off home carvedilol and diltiazem, no events. - push hydration at home - holding home carvedilol and diltiazem and torsemide, advising close cardiology f/u - declines pursuing skilled nursing - discussed slow to rise, pause before starting ambulation  Left shoulder pain secondary to  fall Left rib/shoulder x ray neg - outpt pcp f/u  AF (paroxysmal atrial fibrillation) (HCC) -Currently in sinus -Not on systemic anticoagulation due to history of GI bleed - holding meds as above    Breast cancer in female [facility])   Lung nodule, solitary    Malignant neoplasm of lower third of esophagus ([place]) -Followed by Dr.  Rogue Bussing, last seen on 11/16/2020    Procedures:  none   Consultations:  none  Discharge Exam: Vitals:   11/18/20 0742 11/18/20 0748  BP: (!) 129/93 98/72  Pulse: 70 83  Resp:    Temp:    SpO2:  98%  above second bp is from orthostats  General: chronically ill appearing, NAD Cardiovascular: rrr, soft systolic murmur Respiratory: ctab Ext: no edema  Discharge Instructions   Discharge Instructions    Diet - low sodium heart healthy   Complete by: As directed    Increase activity slowly   Complete by: As directed      Allergies as of 11/18/2020   No Known Allergies     Medication List    STOP taking these medications   carvedilol 6.25 MG tablet Commonly known as: COREG   diltiazem 180 MG 24 hr capsule Commonly known as: CARDIZEM CD   potassium chloride 10 MEQ tablet Commonly known as: KLOR-CON   torsemide 20 MG tablet Commonly known as: DEMADEX     TAKE these medications   acetaminophen 325 MG tablet Commonly known as: TYLENOL Take 2 tablets (650 mg total) by mouth every 6 (six) hours as needed for mild pain (or Fever >/= 101).   albuterol 108 (90 Base) MCG/ACT inhaler Commonly known as: VENTOLIN HFA Inhale 2 puffs into the lungs every 6 (six) hours as needed for wheezing or shortness of breath.   calcium carbonate 1500 (600 Ca) MG Tabs tablet Commonly known as: OSCAL Take 1 tablet by mouth daily.   cholecalciferol 25 MCG (1000 UNIT) tablet Commonly known as: VITAMIN D3 Take 1,000 Units by mouth daily.   citalopram 40 MG tablet Commonly known as: CELEXA Take 40 mg by mouth daily.   feeding supplement (NEPRO CARB STEADY) Liqd Take 237 mLs by mouth 3 (three) times daily between meals.   ferrous sulfate 325 (65 FE) MG tablet Take 1 tablet (325 mg total) by mouth 2 (two) times daily with a meal.   Fluticasone-Umeclidin-Vilant 100-62.5-25 MCG/INH Aepb Inhale 1 puff into the lungs daily.   loperamide 2 MG capsule Commonly known as:  IMODIUM Take 2 mg by mouth as needed for diarrhea or loose stools.   multivitamin with minerals Tabs tablet Take 1 tablet by mouth daily.   pantoprazole 40 MG tablet Commonly known as: Protonix Take 1 tablet (40 mg total) by mouth 2 (two) times daily before a meal.   sodium bicarbonate 650 MG tablet Take 2 tablets (1,300 mg total) by mouth 3 (three) times daily.   sucralfate 1 GM/10ML suspension Commonly known as: CARAFATE Take 10 mLs (1 g total) by mouth 4 (four) times daily.      No Known Allergies    The results of significant diagnostics from this hospitalization (including imaging, microbiology, ancillary and laboratory) are listed below for reference.    Significant Diagnostic Studies: DG Ribs Unilateral W/Chest Left  Result Date: 11/17/2020 CLINICAL DATA:  Fall EXAM: LEFT RIBS AND CHEST - 3+ VIEW COMPARISON:  07/31/2020, 01/20/2020 FINDINGS: Unchanged nodular density in the right upper lobe. Cardiomediastinal contours are normal. Calcific atherosclerosis. No rib fracture. IMPRESSION: No  rib fracture. Electronically Signed   By: Ulyses Jarred M.D.   On: 11/17/2020 00:12   DG Abd 1 View  Result Date: 11/05/2020 CLINICAL DATA:  Central abdominal pain and vomiting. EXAM: ABDOMEN - 1 VIEW COMPARISON:  None. FINDINGS: Paucity of gas demonstrated in the abdomen with air-fluid levels demonstrated in the right upper quadrant. Appearance is suspicious for small bowel obstruction. Suggest supine view for correlation. No radiopaque stones. Surgical clips in the pelvis. Calcification of the aorta. No free intra-abdominal air is demonstrated. IMPRESSION: Paucity of gas in the abdomen with air-fluid levels suspicious for small bowel obstruction. Electronically Signed   By: Lucienne Capers M.D.   On: 11/05/2020 01:54   CT Head Wo Contrast  Result Date: 11/16/2020 CLINICAL DATA:  Fall EXAM: CT HEAD WITHOUT CONTRAST TECHNIQUE: Contiguous axial images were obtained from the base of the skull  through the vertex without intravenous contrast. COMPARISON:  06/05/2020 FINDINGS: Brain: There is no mass, hemorrhage or extra-axial collection. The size and configuration of the ventricles and extra-axial CSF spaces are normal. The brain parenchyma is normal, without acute or chronic infarction. Vascular: No abnormal hyperdensity of the major intracranial arteries or dural venous sinuses. No intracranial atherosclerosis. Skull: The visualized skull base, calvarium and extracranial soft tissues are normal. Sinuses/Orbits: No fluid levels or advanced mucosal thickening of the visualized paranasal sinuses. No mastoid or middle ear effusion. The orbits are normal. IMPRESSION: Normal head CT. Electronically Signed   By: Ulyses Jarred M.D.   On: 11/16/2020 23:45   CT ABDOMEN PELVIS W CONTRAST  Result Date: 11/05/2020 CLINICAL DATA:  Mid to lower abdominal pain.  Nondraining ostomy EXAM: CT ABDOMEN AND PELVIS WITH CONTRAST TECHNIQUE: Multidetector CT imaging of the abdomen and pelvis was performed using the standard protocol following bolus administration of intravenous contrast. CONTRAST:  25mL OMNIPAQUE IOHEXOL 300 MG/ML  SOLN COMPARISON:  10/31/2019 FINDINGS: Lower chest:  Coronary atherosclerosis. Hepatobiliary: Cystic densities in the central and lower right lobe liver, unchanged.Normal gallbladder. The common bile duct is 9 mm in diameter; no internal filling defect. Pancreas: Unremarkable. Spleen: Unremarkable. Adrenals/Urinary Tract: Negative adrenals. No hydronephrosis or stone. Unremarkable bladder. Stomach/Bowel: Postoperative bowel with ileostomy on the right and descending colostomy on the left. There is a cluster of dilated and fluid-filled small bowel loops in the right lower quadrant which measure up to 3.3 cm. The downstream small bowel is decompressed. Vascular/Lymphatic: Heavily calcified aorta and branch vessels from atherosclerosis. No mass or adenopathy. Reproductive:No pathologic findings.  Other: Trace pelvic ascites. Musculoskeletal: No acute abnormalities. Remote L3 and L5 compression fractures. Transitional S1 vertebra. IMPRESSION: Small bowel obstruction with cluster of dilated loops in the right lower quadrant. Electronically Signed   By: Monte Fantasia M.D.   On: 11/05/2020 04:46   DG Shoulder Left  Result Date: 11/17/2020 CLINICAL DATA:  Fall EXAM: LEFT SHOULDER - 2+ VIEW COMPARISON:  None. FINDINGS: No fracture of the proximal left humerus. Ossific irregularity at the most lateral aspect of the left clavicle is favored to be tendinous calcification. No dislocation. IMPRESSION: 1. No fracture or dislocation of the proximal left humerus. 2. Ossific irregularity at the most lateral aspect of the left clavicle is favored to be tendinous calcification. Electronically Signed   By: Ulyses Jarred M.D.   On: 11/17/2020 00:09    Microbiology: Recent Results (from the past 240 hour(s))  SARS CORONAVIRUS 2 (TAT 6-24 HRS) Nasopharyngeal Nasopharyngeal Swab     Status: None   Collection Time: 11/17/20  4:03 AM  Specimen: Nasopharyngeal Swab  Result Value Ref Range Status   SARS Coronavirus 2 NEGATIVE NEGATIVE Final    Comment: (NOTE) SARS-CoV-2 target nucleic acids are NOT DETECTED.  The SARS-CoV-2 RNA is generally detectable in upper and lower respiratory specimens during the acute phase of infection. Negative results do not preclude SARS-CoV-2 infection, do not rule out co-infections with other pathogens, and should not be used as the sole basis for treatment or other patient management decisions. Negative results must be combined with clinical observations, patient history, and epidemiological information. The expected result is Negative.  Fact Sheet for Patients: SugarRoll.be  Fact Sheet for Healthcare Providers: https://www.woods-mathews.com/  This test is not yet approved or cleared by the [place] FDA and  has been  authorized for detection and/or diagnosis of SARS-CoV-2 by FDA under an Emergency Use Authorization (EUA). This EUA will remain  in effect (meaning this test can be used) for the duration of the COVID-19 declaration under Se ction 564(b)(1) of the Act, 21 U.S.C. section 360bbb-3(b)(1), unless the authorization is terminated or revoked sooner.  Performed at [facility], [place] [address] [place], [place] [address]      Labs: Basic Metabolic Panel: Recent Labs  Lab 11/16/20 [id number] 11/17/20 0038 11/17/20 0629 11/18/20 0418  NA 137 134*  --  136  K 4.4 4.0  --  4.1  CL 107 105  --  106  CO2 19* 18*  --  19*  GLUCOSE 84 83  --  74  BUN 59* 57*  --  48*  CREATININE 2.38* 2.07* 2.16* 1.99*  CALCIUM 9.2 9.3  --  8.9  MG  --   --   --  1.6*   Liver Function Tests: Recent Labs  Lab 11/17/20 0038  AST 22  ALT 12  ALKPHOS 54  BILITOT 0.6  PROT 7.5  ALBUMIN 3.9   No results for input(s): LIPASE, AMYLASE in the last 168 hours. No results for input(s): AMMONIA in the last 168 hours. CBC: Recent Labs  Lab 11/16/20 0949 11/17/20 0038 11/17/20 0629 11/18/20 0418  WBC 4.2 6.8 6.9 5.1  NEUTROABS 2.5 4.2  --   --   HGB 10.1* 9.8* 7.9* 8.6*  HCT 29.5* 29.5* 23.7* 25.6*  MCV 97.4 98.3 99.6 98.5  PLT 277 305 256 275   Cardiac Enzymes: No results for input(s): CKTOTAL, CKMB, CKMBINDEX, TROPONINI in the last 168 hours. BNP: BNP (last 3 results) Recent Labs    01/20/20 0502 08/01/20 1026  BNP 603.0* 849.5*    ProBNP (last 3 results) No results for input(s): PROBNP in the last 8760 hours.  CBG: Recent Labs  Lab 11/17/20 0627  GLUCAP 73       Signed:  Desma Maxim MD.  [facility] 11/18/2020, 10:06 AM

## 2020-11-18 NOTE — Progress Notes (Signed)
Legal guardian (daughter) notified of pending discharge.

## 2020-11-18 NOTE — Plan of Care (Signed)
  Problem: Clinical Measurements: Goal: Ability to maintain clinical measurements within normal limits will improve Outcome: Adequate for Discharge Goal: Will remain free from infection Outcome: Adequate for Discharge Goal: Diagnostic test results will improve Outcome: Adequate for Discharge Goal: Respiratory complications will improve Outcome: Adequate for Discharge Goal: Cardiovascular complication will be avoided Outcome: Adequate for Discharge   Problem: Clinical Measurements: Goal: Will remain free from infection Outcome: Adequate for Discharge   Problem: Health Behavior/Discharge Planning: Goal: Ability to manage health-related needs will improve Outcome: Adequate for Discharge   Problem: Education: Goal: Knowledge of General Education information will improve Description: Including pain rating scale, medication(s)/side effects and non-pharmacologic comfort measures Outcome: Adequate for Discharge   Problem: Safety: Goal: Ability to remain free from injury will improve Outcome: Adequate for Discharge   Problem: Skin Integrity: Goal: Risk for impaired skin integrity will decrease Outcome: Adequate for Discharge   Problem: Pain Managment: Goal: General experience of comfort will improve Outcome: Adequate for Discharge

## 2020-11-18 NOTE — Care Management Obs Status (Signed)
[place] NOTIFICATION   Patient Details  Name: Danielle [name] MRN: [id number] Date of Birth: 07-Oct-1956   Medicare Observation Status Notification Given:  Yes  Reviewed with patient via phone. She verbalized understanding.  Gregori Abril E Kalya Troeger, LCSW 11/18/2020, 11:06 AM

## 2020-11-22 IMAGING — CT CT BIOPSY AND ASPIRATION BONE MARROW
1 of 2 series · 11 of 14 positions shown, 14 images · non-contrast
Comparison: none

INDICATION: 63-year-old female with anemia

[Series 2: i-spiral 5.0 b30f · axial · 0.63mm/px · z∈[+852,+950]mm · 11 of 34 slices shown, 14 images]
[im 3/34  soft-tissue]
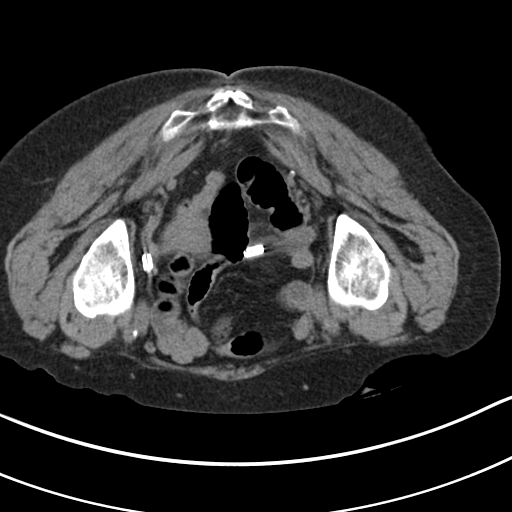
[im 3/34  bone]
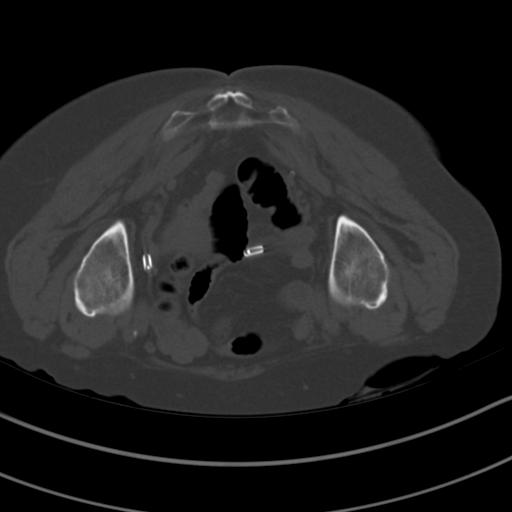
[im 6/34  bone]
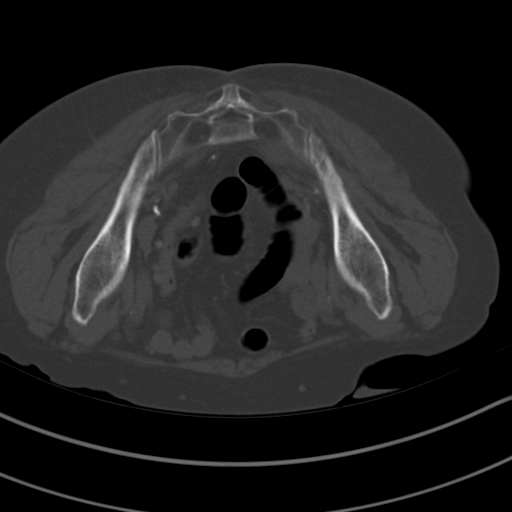
[im 9/34  bone]
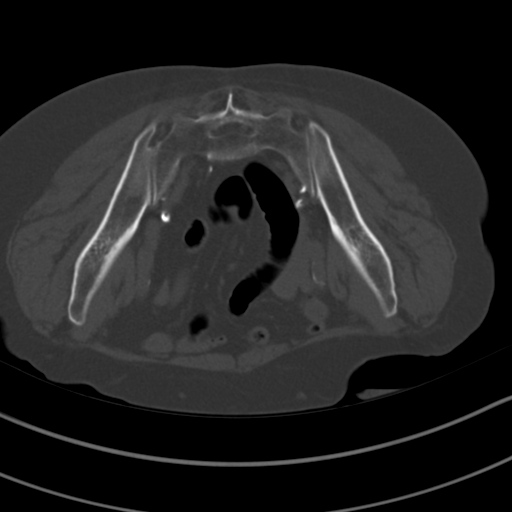
[im 12/34  bone]
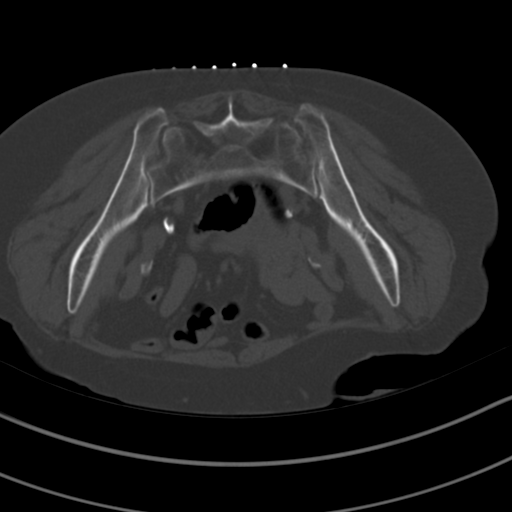
[im 14/34  soft-tissue]
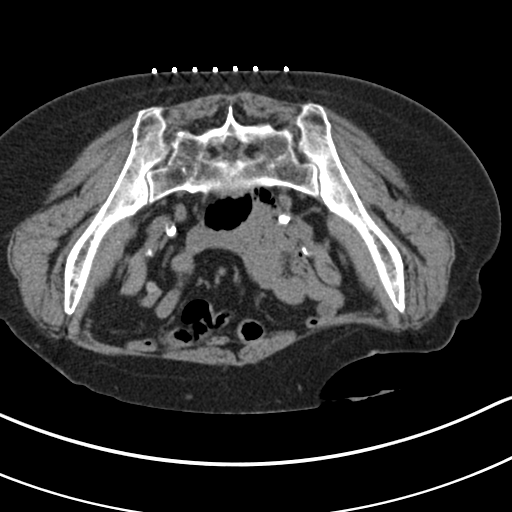
[im 14/34  bone]
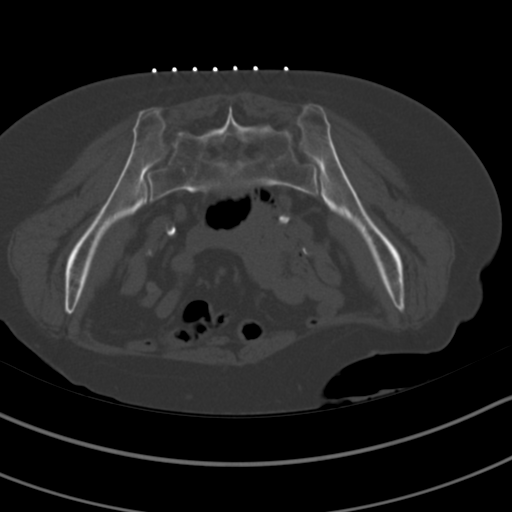
[im 17/34  bone]
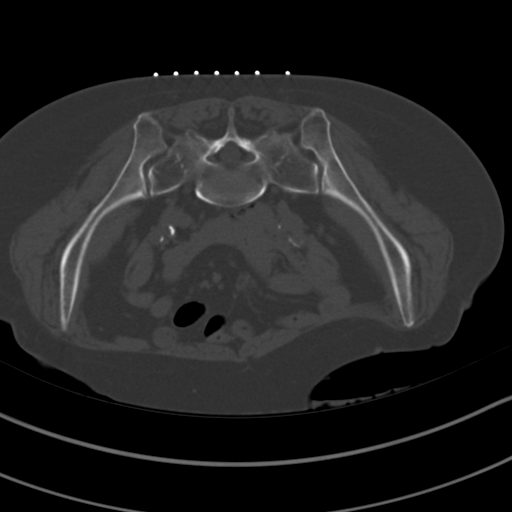
[im 20/34  bone]
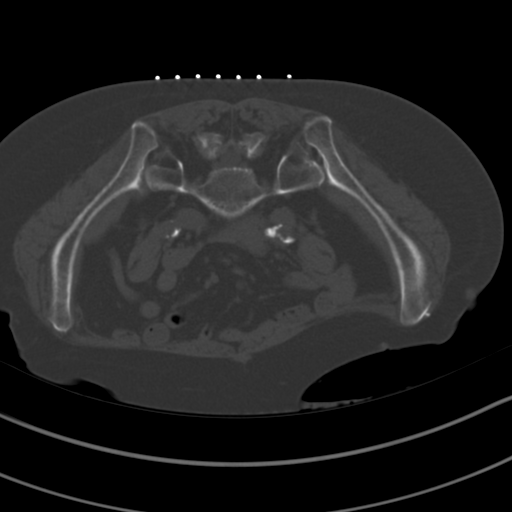
[im 23/34  bone]
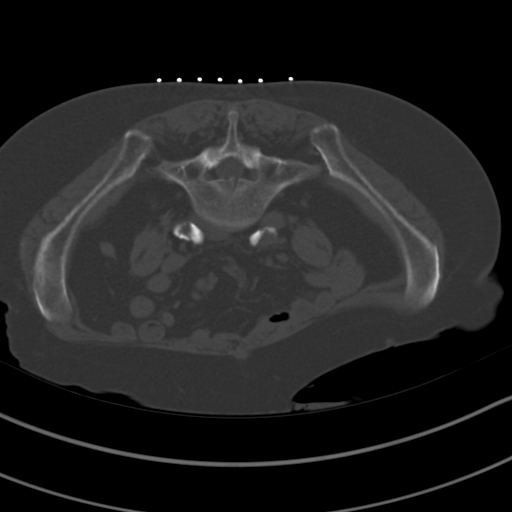
[im 25/34  soft-tissue]
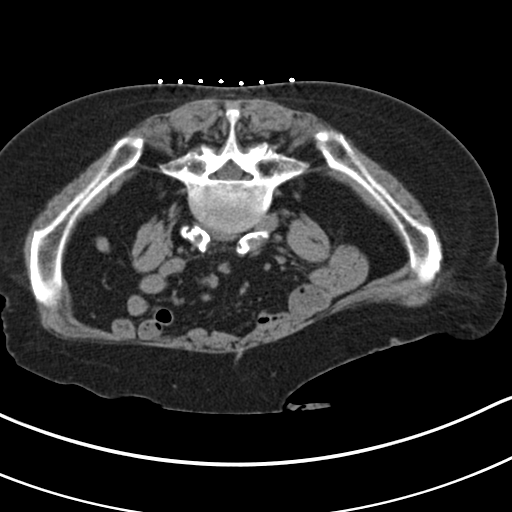
[im 25/34  bone]
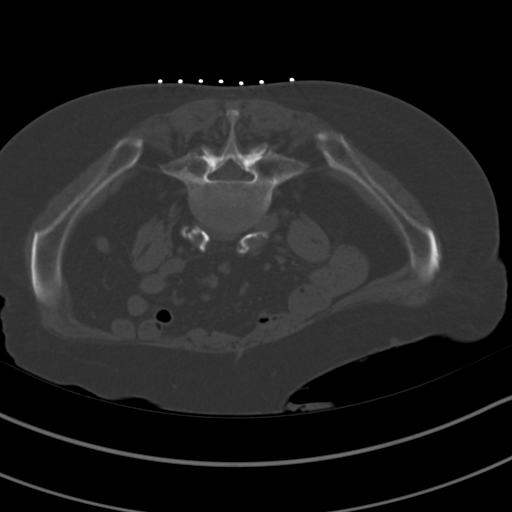
[im 28/34  bone]
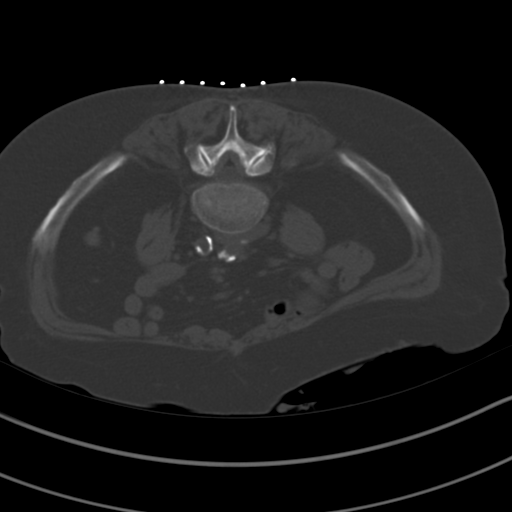
[im 31/34  bone]
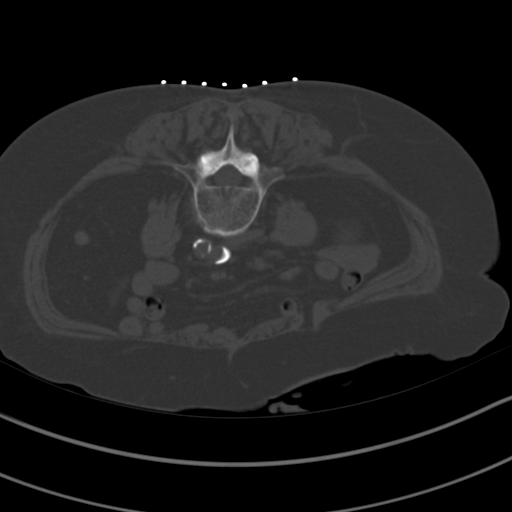

[11 of 14 positions shown; findings below may reference images not displayed]

EXAM:
CT BONE MARROW BIOPSY AND ASPIRATION

MEDICATIONS:
None.

ANESTHESIA/SEDATION:
Moderate (conscious) sedation was employed during this procedure. A
total of Versed 3.0 mg and Fentanyl 100 mcg was administered
intravenously.

Moderate Sedation Time: 15 minutes. The patient's level of
consciousness and vital signs were monitored continuously by
radiology nursing throughout the procedure under my direct
supervision.

FLUOROSCOPY TIME:  None

COMPLICATIONS:
None

PROCEDURE:
The procedure risks, benefits, and alternatives were explained to
the patient. Questions regarding the procedure were encouraged and
answered. The patient understands and consents to the procedure.

Scout CT of the pelvis was performed for surgical planning purposes.

The left posterior pelvis was prepped with Chlorhexidine in a
sterile fashion, and a sterile drape was applied covering the
operative field. A sterile gown and sterile gloves were used for the
procedure. Local anesthesia was provided with 1% Lidocaine.

Posterior left iliac bone was targeted for biopsy. The skin and
subcutaneous tissues were infiltrated with 1% lidocaine without
epinephrine. A small stab incision was made with an 11 blade
scalpel, and an 11 gauge Uvalle needle was advanced with CT guidance
to the posterior cortex. Manual forced was used to advance the
needle through the posterior cortex and the stylet was removed. A
bone marrow aspirate was retrieved and passed to a cytotechnologist
in the room. The Uvalle needle was then advanced without the stylet
for a core biopsy. The core biopsy was retrieved and also passed to
a cytotechnologist.

Manual pressure was used for hemostasis and a sterile dressing was
placed.

No complications were encountered no significant blood loss was
encountered.

Patient tolerated the procedure well and remained hemodynamically
stable throughout.
IMPRESSION: Status post CT-guided bone marrow biopsy, with tissue specimen sent
to pathology for complete histopathologic analysis

## 2020-12-14 ENCOUNTER — Inpatient Hospital Stay: Payer: Medicare Other

## 2020-12-14 ENCOUNTER — Other Ambulatory Visit: Payer: Self-pay

## 2020-12-14 ENCOUNTER — Inpatient Hospital Stay: Payer: Medicare Other | Attending: Internal Medicine

## 2020-12-14 DIAGNOSIS — I129 Hypertensive chronic kidney disease with stage 1 through stage 4 chronic kidney disease, or unspecified chronic kidney disease: Secondary | ICD-10-CM | POA: Insufficient documentation

## 2020-12-14 DIAGNOSIS — N184 Chronic kidney disease, stage 4 (severe): Secondary | ICD-10-CM | POA: Diagnosis not present

## 2020-12-14 DIAGNOSIS — Z79899 Other long term (current) drug therapy: Secondary | ICD-10-CM | POA: Diagnosis not present

## 2020-12-14 DIAGNOSIS — D631 Anemia in chronic kidney disease: Secondary | ICD-10-CM | POA: Diagnosis not present

## 2020-12-14 LAB — HEMATOCRIT: HCT: 33.5 % — ABNORMAL LOW (ref 36.0–46.0)

## 2020-12-14 LAB — HEMOGLOBIN: Hemoglobin: 10.8 g/dL — ABNORMAL LOW (ref 12.0–15.0)

## 2020-12-26 ENCOUNTER — Telehealth: Payer: Self-pay

## 2020-12-26 NOTE — Telephone Encounter (Signed)
-----   Message from Jonathon Bellows, MD sent at 12/22/2020 12:28 PM EST ----- Regarding: RE: follow up EGD Thanks Dr Pearlean Brownie can you schedule EGD to evaluate esophagus due to history of esophageal cancer  Kiran    ----- Message ----- From: [name], MD Sent: 12/14/2020  12:23 PM EST To: Jonathon Bellows, MD Subject: follow up EGD                                  Hi-just checking re: this pts plan for follow up EGD given hx of esophagus ca?  Thnaks GB

## 2020-12-26 NOTE — Telephone Encounter (Signed)
Called pt to schedule EGD as recommended by Dr. Vicente Males. Unable to contact, lvm to return call.

## 2020-12-30 ENCOUNTER — Telehealth: Payer: Self-pay | Admitting: *Deleted

## 2020-12-30 NOTE — Telephone Encounter (Signed)
Pt just called stating that she needed to cx her 01/11/21 lab/Retacrit Inj... due to having another same day appt @ [facility]... Neither appts has been cx.. Pt does have questions to asked and would like for you to give her a call back.... PLEASE Advise on R/S dates

## 2020-12-30 NOTE — Telephone Encounter (Signed)
Spoke with patient. [facility] is planning a [facility] procedure on 3/30. She is requesting to r/s her lab/ possible retacrit to 01/06/21  She will need [name] transportation.  Please r/s patient's lab/retacrit to am on 01/06/21. Pt prefers that the [name] pick her up at 915 if possible.

## 2020-12-30 NOTE — Telephone Encounter (Signed)
Patient will have heart surgery on 3/30

## 2021-01-06 ENCOUNTER — Other Ambulatory Visit: Payer: Self-pay

## 2021-01-06 ENCOUNTER — Inpatient Hospital Stay: Payer: Medicare Other

## 2021-01-06 DIAGNOSIS — N184 Chronic kidney disease, stage 4 (severe): Secondary | ICD-10-CM

## 2021-01-06 DIAGNOSIS — I129 Hypertensive chronic kidney disease with stage 1 through stage 4 chronic kidney disease, or unspecified chronic kidney disease: Secondary | ICD-10-CM | POA: Diagnosis not present

## 2021-01-06 LAB — HEMATOCRIT: HCT: 31.4 % — ABNORMAL LOW (ref 36.0–46.0)

## 2021-01-06 LAB — HEMOGLOBIN: Hemoglobin: 10.3 g/dL — ABNORMAL LOW (ref 12.0–15.0)

## 2021-01-11 ENCOUNTER — Other Ambulatory Visit: Payer: Medicare Other

## 2021-01-11 ENCOUNTER — Ambulatory Visit: Payer: Medicare Other

## 2021-01-11 HISTORY — PX: LEFT ATRIAL APPENDAGE OCCLUSION: SHX173A

## 2021-02-08 ENCOUNTER — Encounter: Payer: Self-pay | Admitting: Internal Medicine

## 2021-02-08 ENCOUNTER — Other Ambulatory Visit: Payer: Self-pay

## 2021-02-08 ENCOUNTER — Inpatient Hospital Stay: Payer: Medicare Other

## 2021-02-08 ENCOUNTER — Inpatient Hospital Stay (HOSPITAL_BASED_OUTPATIENT_CLINIC_OR_DEPARTMENT_OTHER): Payer: Medicare Other | Admitting: Internal Medicine

## 2021-02-08 ENCOUNTER — Inpatient Hospital Stay: Payer: Medicare Other | Attending: Internal Medicine

## 2021-02-08 DIAGNOSIS — C3411 Malignant neoplasm of upper lobe, right bronchus or lung: Secondary | ICD-10-CM | POA: Insufficient documentation

## 2021-02-08 DIAGNOSIS — Z9221 Personal history of antineoplastic chemotherapy: Secondary | ICD-10-CM | POA: Insufficient documentation

## 2021-02-08 DIAGNOSIS — R0602 Shortness of breath: Secondary | ICD-10-CM | POA: Insufficient documentation

## 2021-02-08 DIAGNOSIS — Z87891 Personal history of nicotine dependence: Secondary | ICD-10-CM | POA: Insufficient documentation

## 2021-02-08 DIAGNOSIS — Z79899 Other long term (current) drug therapy: Secondary | ICD-10-CM | POA: Insufficient documentation

## 2021-02-08 DIAGNOSIS — C155 Malignant neoplasm of lower third of esophagus: Secondary | ICD-10-CM | POA: Diagnosis not present

## 2021-02-08 DIAGNOSIS — R630 Anorexia: Secondary | ICD-10-CM | POA: Diagnosis not present

## 2021-02-08 DIAGNOSIS — D631 Anemia in chronic kidney disease: Secondary | ICD-10-CM | POA: Insufficient documentation

## 2021-02-08 DIAGNOSIS — N184 Chronic kidney disease, stage 4 (severe): Secondary | ICD-10-CM | POA: Insufficient documentation

## 2021-02-08 DIAGNOSIS — C50812 Malignant neoplasm of overlapping sites of left female breast: Secondary | ICD-10-CM | POA: Diagnosis not present

## 2021-02-08 LAB — BASIC METABOLIC PANEL
Anion gap: 13 (ref 5–15)
BUN: 37 mg/dL — ABNORMAL HIGH (ref 8–23)
CO2: 17 mmol/L — ABNORMAL LOW (ref 22–32)
Calcium: 9.6 mg/dL (ref 8.9–10.3)
Chloride: 109 mmol/L (ref 98–111)
Creatinine, Ser: 1.93 mg/dL — ABNORMAL HIGH (ref 0.44–1.00)
GFR, Estimated: 28 mL/min — ABNORMAL LOW (ref >60)
Glucose, Bld: 92 mg/dL (ref 70–99)
Potassium: 4.3 mmol/L (ref 3.5–5.1)
Sodium: 139 mmol/L (ref 135–145)

## 2021-02-08 LAB — CBC WITH DIFFERENTIAL/PLATELET
Basophils Absolute: 0 10*3/uL (ref 0.0–0.1)
Basophils Relative: 1 %
Eosinophils Absolute: 0.3 10*3/uL (ref 0.0–0.5)
Eosinophils Relative: 6 %
HCT: 33.4 % — ABNORMAL LOW (ref 36.0–46.0)
Hemoglobin: 11 g/dL — ABNORMAL LOW (ref 12.0–15.0)
Immature Granulocytes: 1 %
Lymphocytes Relative: 36 %
Lymphs Abs: 1.8 10*3/uL (ref 0.7–4.0)
MCH: 33.3 pg (ref 26.0–34.0)
MCHC: 32.9 g/dL (ref 30.0–36.0)
MCV: 101.2 fL — ABNORMAL HIGH (ref 80.0–100.0)
Monocytes Absolute: 0.4 10*3/uL (ref 0.1–1.0)
Monocytes Relative: 9 %
Neutro Abs: 2.3 10*3/uL (ref 1.7–7.7)
Neutrophils Relative %: 48 %
Platelets: 159 10*3/uL (ref 150–400)
RBC: 3.3 MIL/uL — ABNORMAL LOW (ref 3.87–5.11)
RDW: 14.1 % (ref 11.5–15.5)
WBC: 5 10*3/uL (ref 4.0–10.5)
nRBC: 0 % (ref 0.0–0.2)

## 2021-02-08 MED ORDER — PREDNISONE 10 MG PO TABS: 10.0000 mg | ORAL_TABLET | Freq: Every day | ORAL | 0 refills | Status: DC

## 2021-02-08 NOTE — Assessment & Plan Note (Signed)
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  Currently s/p definitive radiation [dec 7th 2020].  Posttreatment PET scan February 2021-no evidence of any metastatic disease.  No uptake in the esophagus region. EGD April 28 2020 th- NO evidence of recurrence. STABLE  Continue surveillance.   #Severe anemia-likely secondary to CKD stage IV-currently on Retacrit; hemoglobin today is 11 HOLD  Retacrit today. STABLE.     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 STABLE.  #Weight loss/poor appetite- trial of prednisone 10mg /day x 4 weeks.    # DISPOSITION:  #  RETACRIT today # in 4 weeks- H&H- possible retacrit # in 8 weeks- H&H- possible retacrit # follow up in 12 weeks- cbc/bmp/ possible Retacrit;-Dr.B

## 2021-02-08 NOTE — Progress Notes (Signed)
[name] OFFICE PROGRESS NOTE  Patient Care Team: [facility] [place] as PCP - General ([place]) [name], FNP as Nurse Practitioner (Family Medicine) [name], NP as Nurse Practitioner (Nurse Practitioner) [name], RN as Registered Nurse [name], RN as Registered Nurse [name], RN as Oncology Nurse Navigator [name], MD as Consulting Physician (Hematology and Oncology)  Cancer Staging Breast cancer in female [facility]) Staging form: Breast, AJCC 7th Edition - Clinical: Stage IIIA (T3, N1, M0) - Signed by [name], NP on 02/21/2016 Estrogen receptor status: Positive Progesterone receptor status: Positive    Oncology History Overview Note  # 2016- JAN RUL 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 A. LUNG MASS, RIGHT; CT-GUIDED BIOPSY: - ADENOCARCINOMA, ACINAR AND PAPILLARY MORPHOLOGIES. S/p RT [finished Dec 2017] s/p RT [DEC 2017]; AUG 2019- PET-right middle lobe radiation changes no evidence of recurrence.  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  T1b- submucosal involvement; N0- stage I [[name]; [facility]]; unable to proceed with Endo mucosal resection-because of extensive disease [Dr.Branch; [facility]]; recommend definitive radiation [finished  RT- 09/21/2019]   #September 2020 worsening macrocytic anemia--bone marrow biopsy shows plasma cells up to 5%; unlikely to explain patient's anemia.  MDS is still a possibility-if anemia worsens would recommend peripheral blood NGS.  Jan 2021 -anemia likely secondary to worsening CKD-Retacrit  # 2000-  Left breast cancer [T3N1- stage  III] mastecomy s/p RT; s/p chemo; Tamoxifen  # NOV 111 2017- Mol testing [RML- adeno ca]  # 2019-July-aug 2019-ischemic colitis status post right hemicolectomy [Dr.[name]]; acute renal failure;  -----------------------------------------------------------------    DIAGNOSIS: RML Lung ca adeno [stage I] #squamous cell carcinoma esophagus -stage I   CURRENT/MOST RECENT THERAPY: Status post radiation       Breast cancer in female [facility])  07/24/1999 Initial Diagnosis   Breast cancer in female ([place]) T3 N1 M0 tumor ER/PR positive   08/24/1999 -  Anti-estrogen oral therapy   Started tamoxifen   08/24/1999 Surgery   Status post modified radical mastectomy of left breast    Chemotherapy      Radiation Therapy     11/24/2003 - 02/20/2009 Anti-estrogen oral therapy   Started Aromasin   Carcinoma of overlapping sites of left breast in female, estrogen receptor positive ([place])  06/22/2016 Initial Diagnosis   Cancer of overlapping sites of left female breast ([place])   Malignant neoplasm of upper lobe of right lung ([place])  Malignant neoplasm of lower third of esophagus ([place])  05/01/2019 Initial Diagnosis   Malignant neoplasm of lower third of esophagus ([place])     INTERVAL HISTORY:  Danielle [name] 65 y.o.  female pleasant patient above history of multiple malignancies including most recent stage I squamous cell cancer of esophagus currently status post radiation; and history of severe anemia-CKD stage IV/MGUS is here for follow-up.  Patient denies any further bowel obstruction symptoms.  Denies any diarrhea.  No nausea no vomiting.  Complains of poor appetite.  However no significant weight loss.  Chronic shortness of breath chronic cough not any worse.  No difficulty swallowing or pain with swallowing.  Review of Systems  Constitutional: Positive for malaise/fatigue and weight loss.  Negative for chills, diaphoresis and fever.  HENT: Negative for nosebleeds and sore throat.   Eyes: Negative  for double vision.  Respiratory: Positive for cough and shortness of breath. Negative for hemoptysis, sputum production and wheezing.   Cardiovascular: Negative for chest pain, palpitations, orthopnea and leg swelling.  Gastrointestinal: Negative for abdominal pain, blood in stool, constipation, heartburn, melena and vomiting.  Genitourinary: Negative for dysuria, frequency and urgency.  Musculoskeletal: Negative for back pain and joint pain.  Skin: Negative.  Negative for itching and rash.  Neurological: Negative for tingling, focal weakness, weakness and headaches.  Endo/Heme/Allergies: Does not bruise/bleed easily.  Psychiatric/Behavioral: Negative for depression. The patient is not nervous/anxious and does not have insomnia.       PAST MEDICAL HISTORY :  Past Medical History:  Diagnosis Date  . Anxiety   . Arthritis   . Breast cancer ([place]) 2000  . Cancer ([place]) left    breast cancer 2000, chemo tx's with total mastectomy and lymph nodes resected.   . Cancer of right lung ([place]) 02/21/2016   rad tx's.   . CHF (congestive heart failure) ([place])   . COPD (chronic obstructive pulmonary disease) ([place])   . Dependence on supplemental oxygen   . Depression   . Heart murmur   . Hypertension   . Lung nodule   . Lymphedema   . Personal history of chemotherapy   . Personal history of radiation therapy   . Shortness of breath dyspnea    with exertion  . Status post chemotherapy 2001   left breast cancer  . Status post radiation therapy 2001   left breast cancer    PAST SURGICAL HISTORY :   Past Surgical History:  Procedure Laterality Date  . Breast Biospy Left    [facility]  . BREAST SURGERY    . COLONOSCOPY N/A 04/30/2018   Procedure: COLONOSCOPY;  Surgeon: Virgel Manifold, MD;  Location: [facility] ENDOSCOPY;  Service: Endoscopy;  Laterality: N/A;  . COLONOSCOPY N/A 07/22/2018   Procedure: COLONOSCOPY;  Surgeon: Virgel Manifold, MD;  Location: [facility] ENDOSCOPY;  Service: Endoscopy;   Laterality: N/A;  . DILATION AND CURETTAGE OF UTERUS    . ELECTROMAGNETIC NAVIGATION BROCHOSCOPY Right 04/11/2016   Procedure: ELECTROMAGNETIC NAVIGATION BRONCHOSCOPY;  Surgeon: Vilinda Boehringer, MD;  Location: [facility] ORS;  Service: Cardiopulmonary;  Laterality: Right;  . ESOPHAGOGASTRODUODENOSCOPY N/A 07/22/2018   Procedure: ESOPHAGOGASTRODUODENOSCOPY (EGD);  Surgeon: Virgel Manifold, MD;  Location: [facility] ENDOSCOPY;  Service: Endoscopy;  Laterality: N/A;  . ESOPHAGOGASTRODUODENOSCOPY (EGD) WITH PROPOFOL N/A 05/07/2018   Procedure: ESOPHAGOGASTRODUODENOSCOPY (EGD) WITH PROPOFOL;  Surgeon: Lucilla Lame, MD;  Location: [facility] ENDOSCOPY;  Service: Endoscopy;  Laterality: N/A;  . ESOPHAGOGASTRODUODENOSCOPY (EGD) WITH PROPOFOL N/A 04/24/2019   Procedure: ESOPHAGOGASTRODUODENOSCOPY (EGD) WITH PROPOFOL;  Surgeon: Jonathon Bellows, MD;  Location: [facility] ENDOSCOPY;  Service: Gastroenterology;  Laterality: N/A;  . ESOPHAGOGASTRODUODENOSCOPY (EGD) WITH PROPOFOL N/A 01/12/2020   Procedure: ESOPHAGOGASTRODUODENOSCOPY (EGD) WITH PROPOFOL;  Surgeon: Jonathon Bellows, MD;  Location: [facility] ENDOSCOPY;  Service: Gastroenterology;  Laterality: N/A;  . ESOPHAGOGASTRODUODENOSCOPY (EGD) WITH PROPOFOL N/A 04/28/2020   Procedure: ESOPHAGOGASTRODUODENOSCOPY (EGD) WITH PROPOFOL;  Surgeon: Jonathon Bellows, MD;  Location[facility] ENDOSCOPY;  Service: Gastroenterology;  Laterality: N/A;  . EUS N/A 05/07/2019   Procedure: FULL UPPER ENDOSCOPIC ULTRASOUND (EUS) RADIAL;  Surgeon: Jola Schmidt, MD;  Location: [facility] ENDOSCOPY;  Service: Endoscopy;  Laterality: N/A;  . history of colonoscopy]    . ILEOSCOPY N/A 07/22/2018   Procedure: ILEOSCOPY THROUGH STOMA;  Surgeon: Virgel Manifold, MD;  Location: [facility] ENDOSCOPY;  Service: Endoscopy;  Laterality: N/A;  . ILEOSTOMY    . ILEOSTOMY N/A 09/08/2018   Procedure: ILEOSTOMY REVISION POSSIBLE CREATION;  Surgeon: Herbert Pun, MD;  Location: [facility] ORS;  Service: General;  Laterality: N/A;  . ILEOSTOMY  CLOSURE N/A 08/15/2018   Procedure: DILATION OF ILEOSTOMY STRICTURE;  Surgeon: Herbert Pun, MD;  Location: [facility] ORS;  Service: General;  Laterality: N/A;  . LAPAROTOMY Right 05/04/2018   Procedure: EXPLORATORY LAPAROTOMY right colectomy right and left ostomy;  Surgeon: Herbert Pun, MD;  Location: [facility] ORS;  Service: General;  Laterality: Right;  . LUNG BIOPSY    . MASTECTOMY Left    2000, [facility]  . ROTATOR CUFF REPAIR Right    [facility]    FAMILY HISTORY :   Family History  Problem Relation Age of Onset  . Breast cancer Mother 53  . Cancer Mother        Breast   . Cirrhosis Father   . Breast cancer Paternal Aunt 35  . Cancer Maternal Aunt        Breast     SOCIAL HISTORY:   Social History   Tobacco Use  . Smoking status: Former Smoker    Packs/day: 0.50    Years: 20.00    Pack years: 10.00    Types: Cigarettes    Quit date: 07/02/2012    Years since quitting: 8.6  . Smokeless tobacco: Current User    Types: Snuff  . Tobacco comment: quit 2014  Vaping Use  . Vaping Use: Never used  Substance Use Topics  . Alcohol use: Not Currently    Comment: Occasionally  . Drug use: No    ALLERGIES:  has No Known Allergies.  MEDICATIONS:  Current Outpatient Medications  Medication Sig Dispense Refill  . acetaminophen (TYLENOL) 325 MG tablet Take 2 tablets (650 mg total) by mouth every 6 (six) hours as needed for mild pain (or Fever >/= 101).    [name] albuterol (PROVENTIL HFA;VENTOLIN HFA) 108 (90 Base) MCG/ACT inhaler Inhale 2 puffs into the lungs every 6 (six) hours as needed for wheezing or shortness of breath. 1 Inhaler 2  . apixaban (ELIQUIS) 2.5 MG TABS tablet Eliquis 2.5 mg tablet    . calcium carbonate (OSCAL) 1500 (600 Ca) MG TABS tablet Take 1 tablet by mouth daily.    . carvedilol (COREG) 12.5 MG tablet carvedilol 12.5 mg tablet    . cholecalciferol (VITAMIN D3) 25 MCG (1000 UNIT) tablet Take 1,000 Units by mouth daily.    . citalopram (CELEXA) 40 MG tablet  Take 40 mg by mouth daily.     . ferrous sulfate 325 (65 FE) MG tablet Take 1 tablet (325 mg total) by mouth 2 (two) times daily with a meal. 60 tablet 3  . Fluticasone-Umeclidin-Vilant 100-62.5-25 MCG/INH AEPB Inhale 1 puff into the lungs daily.     [name] loperamide (IMODIUM) 2 MG capsule Take 2 mg by mouth as needed for diarrhea or loose stools.    . Multiple Vitamin (MULTIVITAMIN WITH MINERALS) TABS tablet Take 1 tablet by mouth daily. 30 tablet 1  . Nutritional Supplements (FEEDING SUPPLEMENT, NEPRO CARB STEADY,) LIQD Take 237 mLs by mouth 3 (three) times daily between meals. 21330 mL 0  . pantoprazole (PROTONIX) 40 MG tablet Take 1 tablet (40 mg total) by mouth 2 (two) times daily before a meal. 60 tablet 2  . predniSONE (DELTASONE) 10 MG tablet Take 1 tablet (10 mg total) by mouth daily with breakfast. 30 tablet  0  . sodium bicarbonate 650 MG tablet Take 2 tablets (1,300 mg total) by mouth 3 (three) times daily. 180 tablet 0  . sucralfate (CARAFATE) 1 GM/10ML suspension Take 10 mLs (1 g total) by mouth 4 (four) times daily. 1200 mL 2  . amLODipine (NORVASC) 5 MG tablet amlodipine 5 mg tablet  TAKE ONE TABLET BY MOUTH EVERY DAY FOR BLOOD PRESSURE    . losartan-hydrochlorothiazide (HYZAAR) 100-25 MG tablet losartan 100 mg-hydrochlorothiazide 25 mg tablet     No current facility-administered medications for this visit.    PHYSICAL EXAMINATION: ECOG PERFORMANCE STATUS: 1 - Symptomatic but completely ambulatory  BP 127/90 (BP Location: Left Arm, Patient Position: Sitting, Cuff Size: Normal)   Pulse 88   Temp (!) 97.5 F (36.4 C) (Tympanic)   Resp 16   Ht $R'5\' 5"'tU$  (1.651 m)   Wt 123 lb (55.8 kg)   SpO2 97%   BMI 20.47 kg/m   Filed Weights   02/08/21 0953  Weight: 123 lb (55.8 kg)    Physical Exam Constitutional:      Comments: Thin built moderately nourished female patient.  She is walking herself.  She is alone.  HENT:     Head: Normocephalic and atraumatic.     Mouth/Throat:      Pharynx: No oropharyngeal exudate.  Eyes:     Pupils: Pupils are equal, round, and reactive to light.  Cardiovascular:     Rate and Rhythm: Normal rate and regular rhythm.  Pulmonary:     Effort: No respiratory distress.     Breath sounds: No wheezing.     Comments: Decreased breath sounds bilaterally.  Abdominal:     General: Bowel sounds are normal. There is no distension.     Palpations: Abdomen is soft. There is no mass.     Tenderness: There is no abdominal tenderness. There is no guarding or rebound.  Musculoskeletal:        General: No tenderness. Normal range of motion.     Cervical back: Normal range of motion and neck supple.     Comments: Left upper extremity-swollen/no warmth or tenderness.   Skin:    General: Skin is warm.  Neurological:     Mental Status: She is alert and oriented to person, place, and time.  Psychiatric:        Mood and Affect: Affect normal.     LABORATORY DATA:  I have reviewed the data as listed    Component Value Date/Time   NA 139 02/08/2021 0907   NA 132 (L) 02/09/2015 1100   K 4.3 02/08/2021 0907   K 3.8 02/09/2015 1100   CL 109 02/08/2021 0907   CL 95 (L) 02/09/2015 1100   CO2 17 (L) 02/08/2021 0907   CO2 29 02/09/2015 1100   GLUCOSE 92 02/08/2021 0907   GLUCOSE 105 (H) 02/09/2015 1100   BUN 37 (H) 02/08/2021 0907   BUN 16 02/09/2015 1100   CREATININE 1.93 (H) 02/08/2021 0907   CREATININE 0.81 02/09/2015 1100   CALCIUM 9.6 02/08/2021 0907   CALCIUM 9.1 02/09/2015 1100   PROT 7.5 11/17/2020 0038   PROT 7.7 02/09/2015 1100   ALBUMIN 3.9 11/17/2020 0038   ALBUMIN 4.3 02/09/2015 1100   AST 22 11/17/2020 0038   AST 29 02/09/2015 1100   ALT 12 11/17/2020 0038   ALT 20 02/09/2015 1100   ALKPHOS 54 11/17/2020 0038   ALKPHOS 69 02/09/2015 1100   BILITOT 0.6 11/17/2020 0038   BILITOT 0.9 02/09/2015 1100  GFRNONAA 28 (L) 02/08/2021 0907   GFRNONAA >60 02/09/2015 1100   GFRAA 37 (L) 06/29/2020 0930   GFRAA >60 02/09/2015 1100     No results found for: SPEP, UPEP  Lab Results  Component Value Date   WBC 5.0 02/08/2021   NEUTROABS 2.3 02/08/2021   HGB 11.0 (L) 02/08/2021   HCT 33.4 (L) 02/08/2021   MCV 101.2 (H) 02/08/2021   PLT 159 02/08/2021      Chemistry      Component Value Date/Time   NA 139 02/08/2021 0907   NA 132 (L) 02/09/2015 1100   K 4.3 02/08/2021 0907   K 3.8 02/09/2015 1100   CL 109 02/08/2021 0907   CL 95 (L) 02/09/2015 1100   CO2 17 (L) 02/08/2021 0907   CO2 29 02/09/2015 1100   BUN 37 (H) 02/08/2021 0907   BUN 16 02/09/2015 1100   CREATININE 1.93 (H) 02/08/2021 0907   CREATININE 0.81 02/09/2015 1100      Component Value Date/Time   CALCIUM 9.6 02/08/2021 0907   CALCIUM 9.1 02/09/2015 1100   ALKPHOS 54 11/17/2020 0038   ALKPHOS 69 02/09/2015 1100   AST 22 11/17/2020 0038   AST 29 02/09/2015 1100   ALT 12 11/17/2020 0038   ALT 20 02/09/2015 1100   BILITOT 0.6 11/17/2020 0038   BILITOT 0.9 02/09/2015 1100       RADIOGRAPHIC STUDIES: I have personally reviewed the radiological images as listed and agreed with the findings in the report. No results found.   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  Currently s/p definitive radiation [dec 7th 2020].  Posttreatment PET scan February 2021-no evidence of any metastatic disease.  No uptake in the esophagus region. EGD April 28 2020 th- NO evidence of recurrence. STABLE  Continue surveillance.   #Severe anemia-likely secondary to CKD stage IV-currently on Retacrit; hemoglobin today is 11 HOLD  Retacrit today. STABLE.     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 STABLE.  #Weight loss/poor appetite- trial of prednisone $RemoveBefor'10mg'TqFsLuXLHmUz$ /day x 4 weeks.    #  DISPOSITION:  #  RETACRIT today # in 4 weeks- H&H- possible retacrit # in 8 weeks- H&H- possible retacrit # follow up in 12 weeks- cbc/bmp/ possible Retacrit;-Dr.B  Orders Placed This Encounter  Procedures  . Hematocrit ([facility])    Standing Status:   Standing    Number of Occurrences:   2    Standing Expiration Date:   02/08/2022  . Hemoglobin [address] [facility])    Standing Status:   Standing    Number of Occurrences:   2    Standing Expiration Date:   02/08/2022  . CBC with Differential    Standing Status:   Future    Standing Expiration Date:   02/08/2022  . Basic metabolic panel    Standing Status:   Future    Standing Expiration Date:   02/08/2022   All questions were answered. The patient knows to call the clinic with any problems, questions or concerns.      [name], MD 02/08/2021 12:32 PM

## 2021-02-08 NOTE — Progress Notes (Signed)
Concerned about appetite. Wants to be put on something to help.

## 2021-02-10 IMAGING — DX DG CHEST 1V PORT
1 series · 1 of 1 positions shown · non-contrast
Comparison: 06/19/2019

CLINICAL DATA: Chest pressure, shortness of breath

EXAM:
PORTABLE CHEST 1 VIEW

[chest pa]
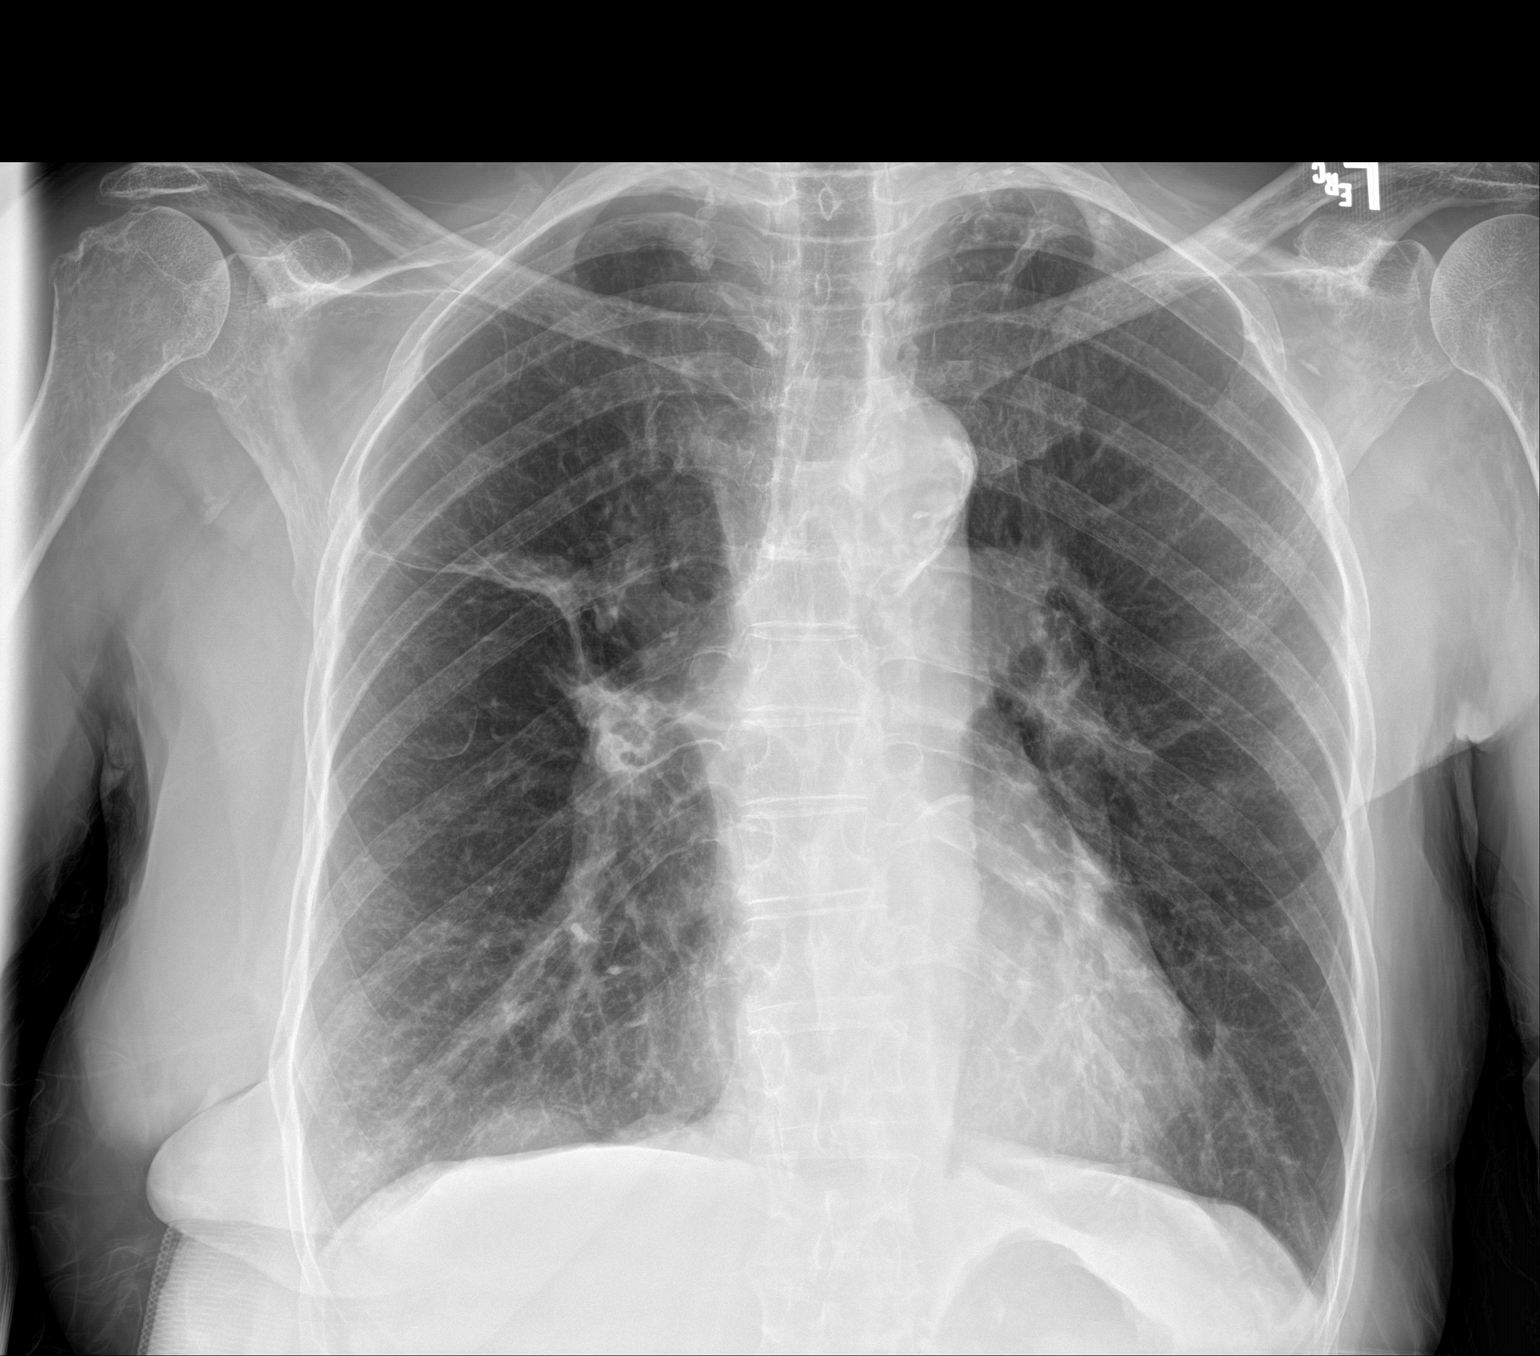

[1 of 1 positions shown; findings below may reference images not displayed]

FINDINGS: Heart size is normal. Stable mediastinal contours with chronic
radiation fibrosis in the right perihilar region. Prominence of the
left hilum is unchanged from priors. Chronic coarsened interstitial
markings. No new focal airspace consolidation, pleural effusion, or
pneumothorax.
IMPRESSION: Chronic lung changes without evidence of new or acute
cardiopulmonary process.

## 2021-02-11 IMAGING — US US RENAL
1 series · 14 of 25 positions shown · non-contrast
Comparison: CT dated April 29, 2019

CLINICAL DATA: Acute kidney injury

EXAM:
RENAL / URINARY TRACT ULTRASOUND COMPLETE

[Series 1: us renal · 14 of 29 slices shown]
[im 1/29]
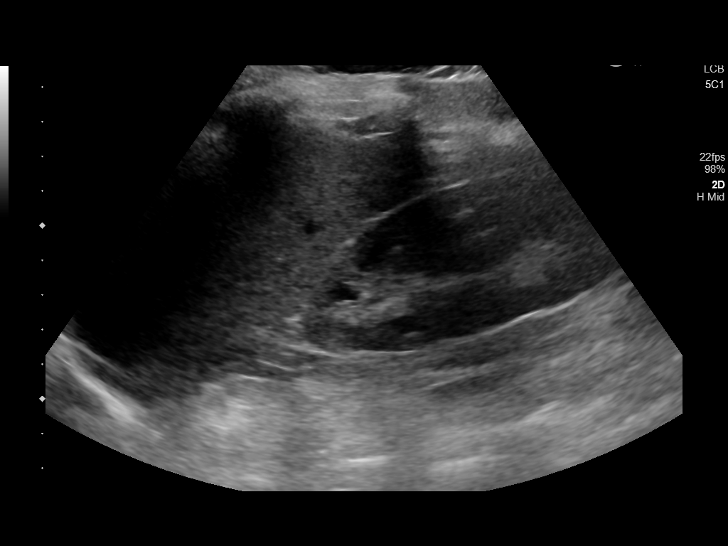
[im 3/29]
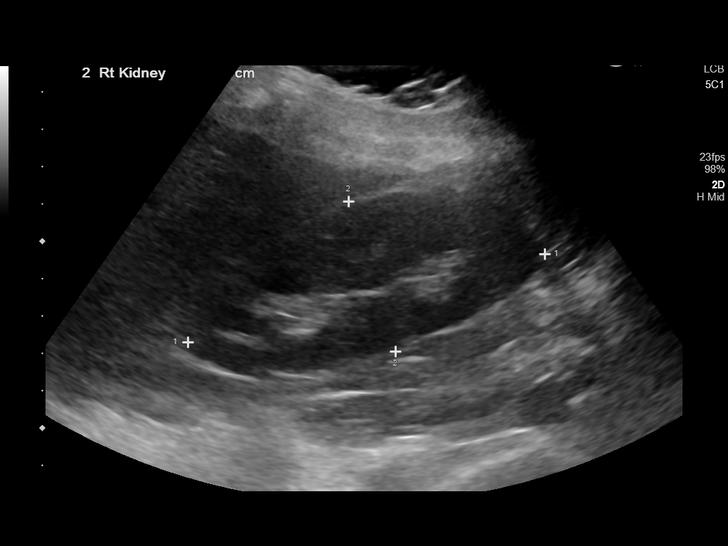
[im 5/29]
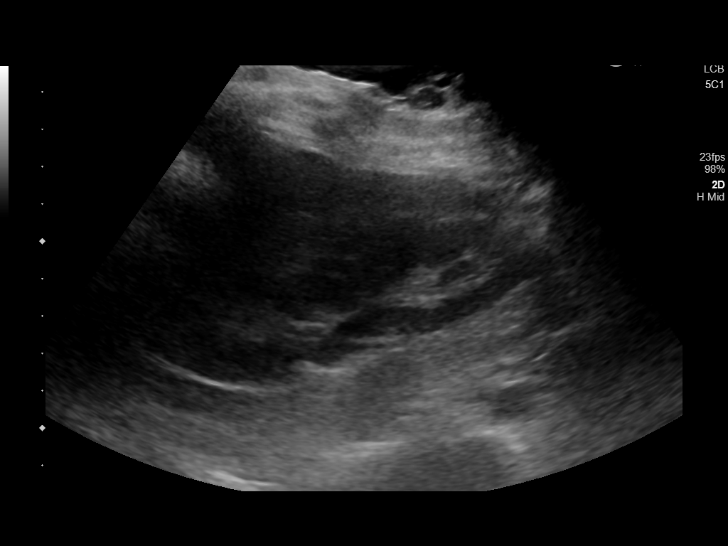
[im 8/29]
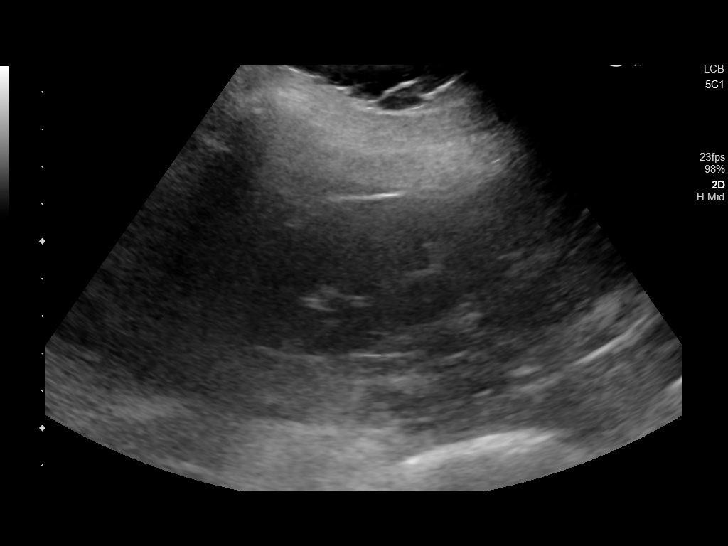
[im 10/29]
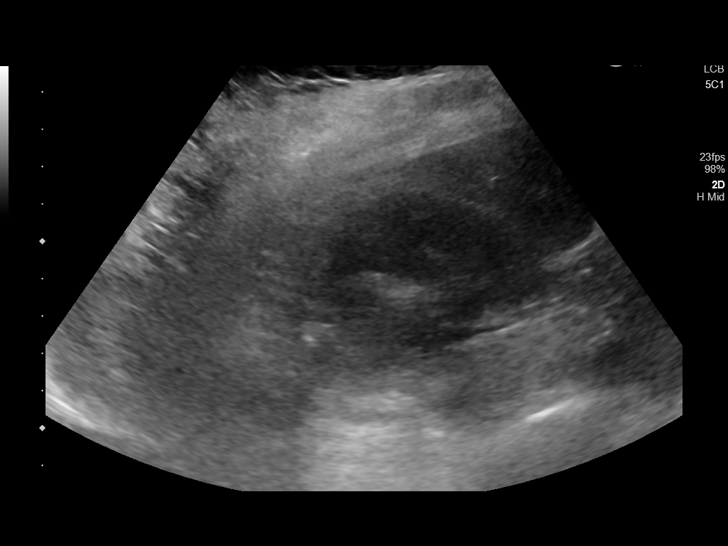
[im 11/29]
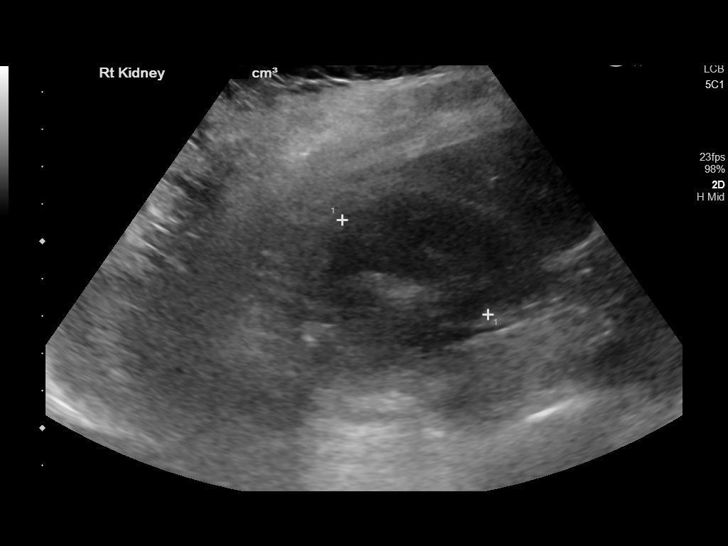
[im 13/29]
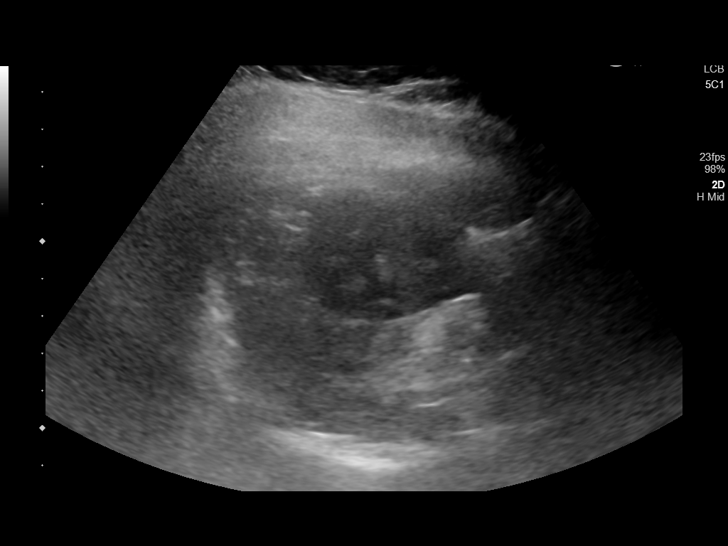
[im 16/29]
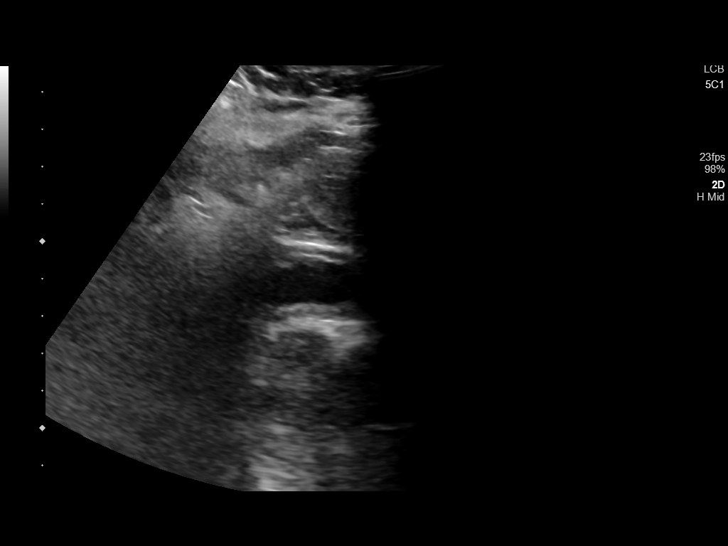
[im 18/29]
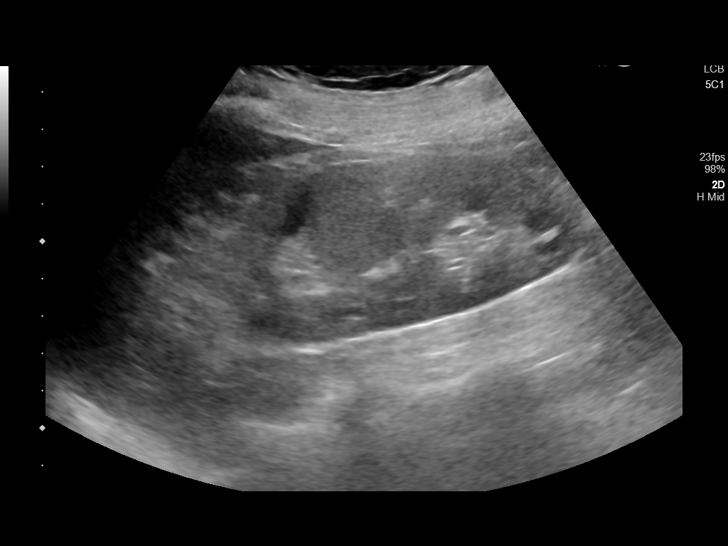
[im 19/29]
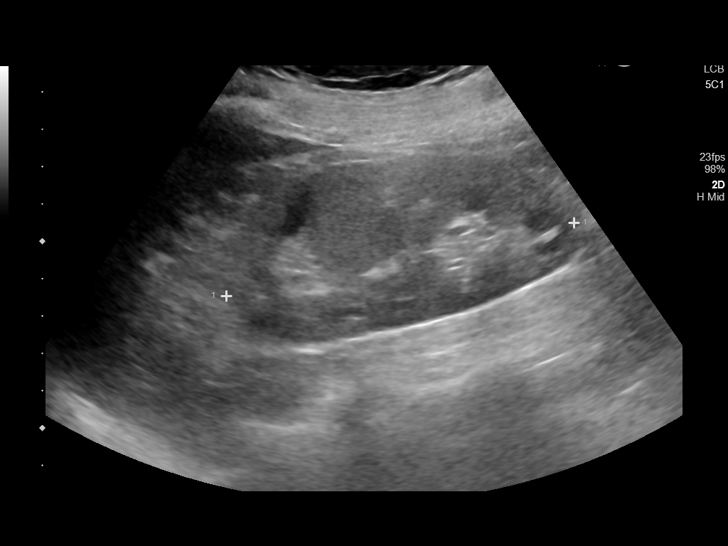
[im 22/29]
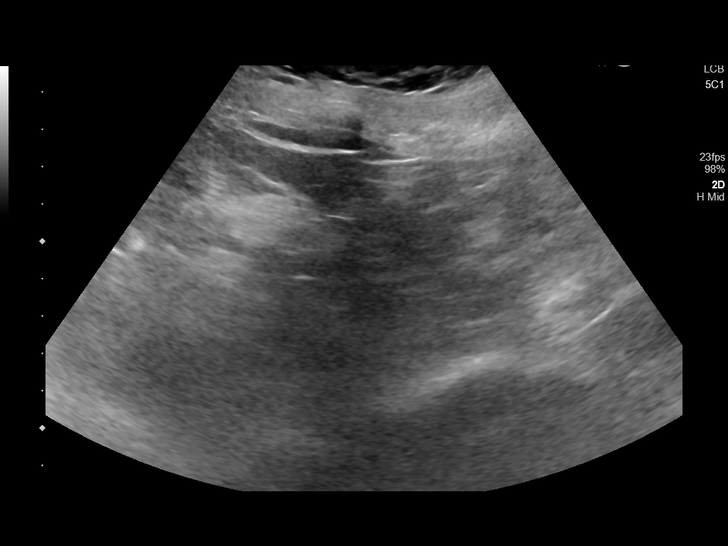
[im 24/29]
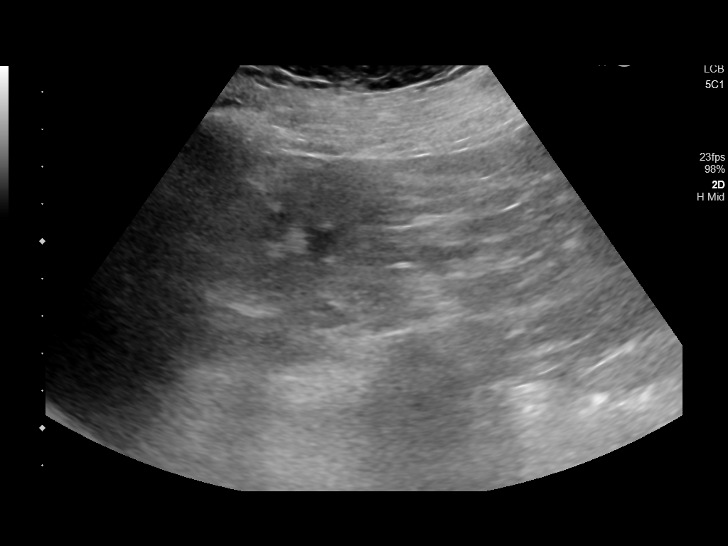
[im 26/29]
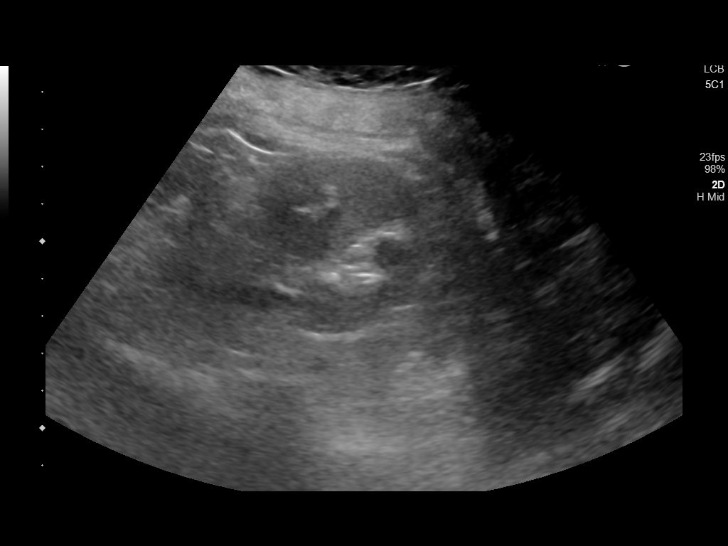
[im 29/29]
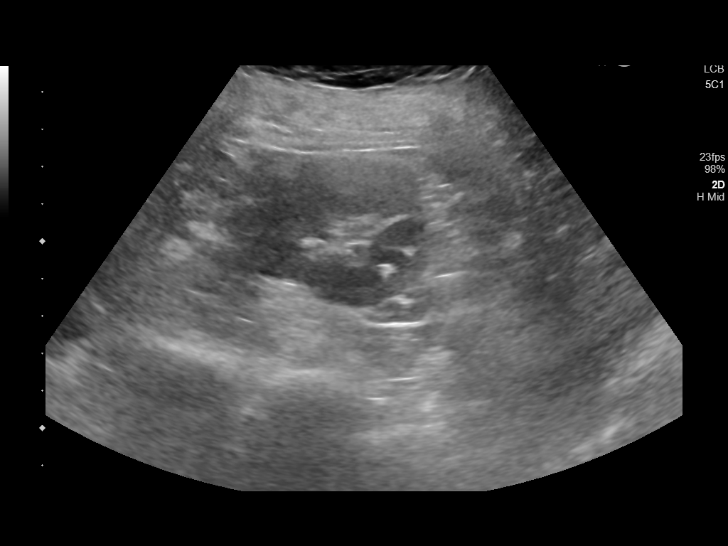

[14 of 25 positions shown; findings below may reference images not displayed]

FINDINGS: Right Kidney:

Renal measurements: 9.8 x 4.2 x 4.6 cm = volume: 100 mL. There is
increased cortical echogenicity. There is no hydronephrosis.

Left Kidney:

Renal measurements: 9.5 x 4.5 x 4.9 cm = volume: 110 mL. There is
increased cortical echogenicity without evidence for hydronephrosis

Bladder:

Appears normal for degree of bladder distention.

Other:

None.
IMPRESSION: 1. No acute abnormality.
2. Echogenic kidneys bilaterally which can be seen in patients with
medical renal disease.

## 2021-03-02 IMAGING — DX DG ABD PORTABLE 1V
1 series · 1 of 1 positions shown · non-contrast
Comparison: 07/20/2018

CLINICAL DATA: Generalized abdominal pain

EXAM:
PORTABLE ABDOMEN - 1 VIEW

[abdomen supine]
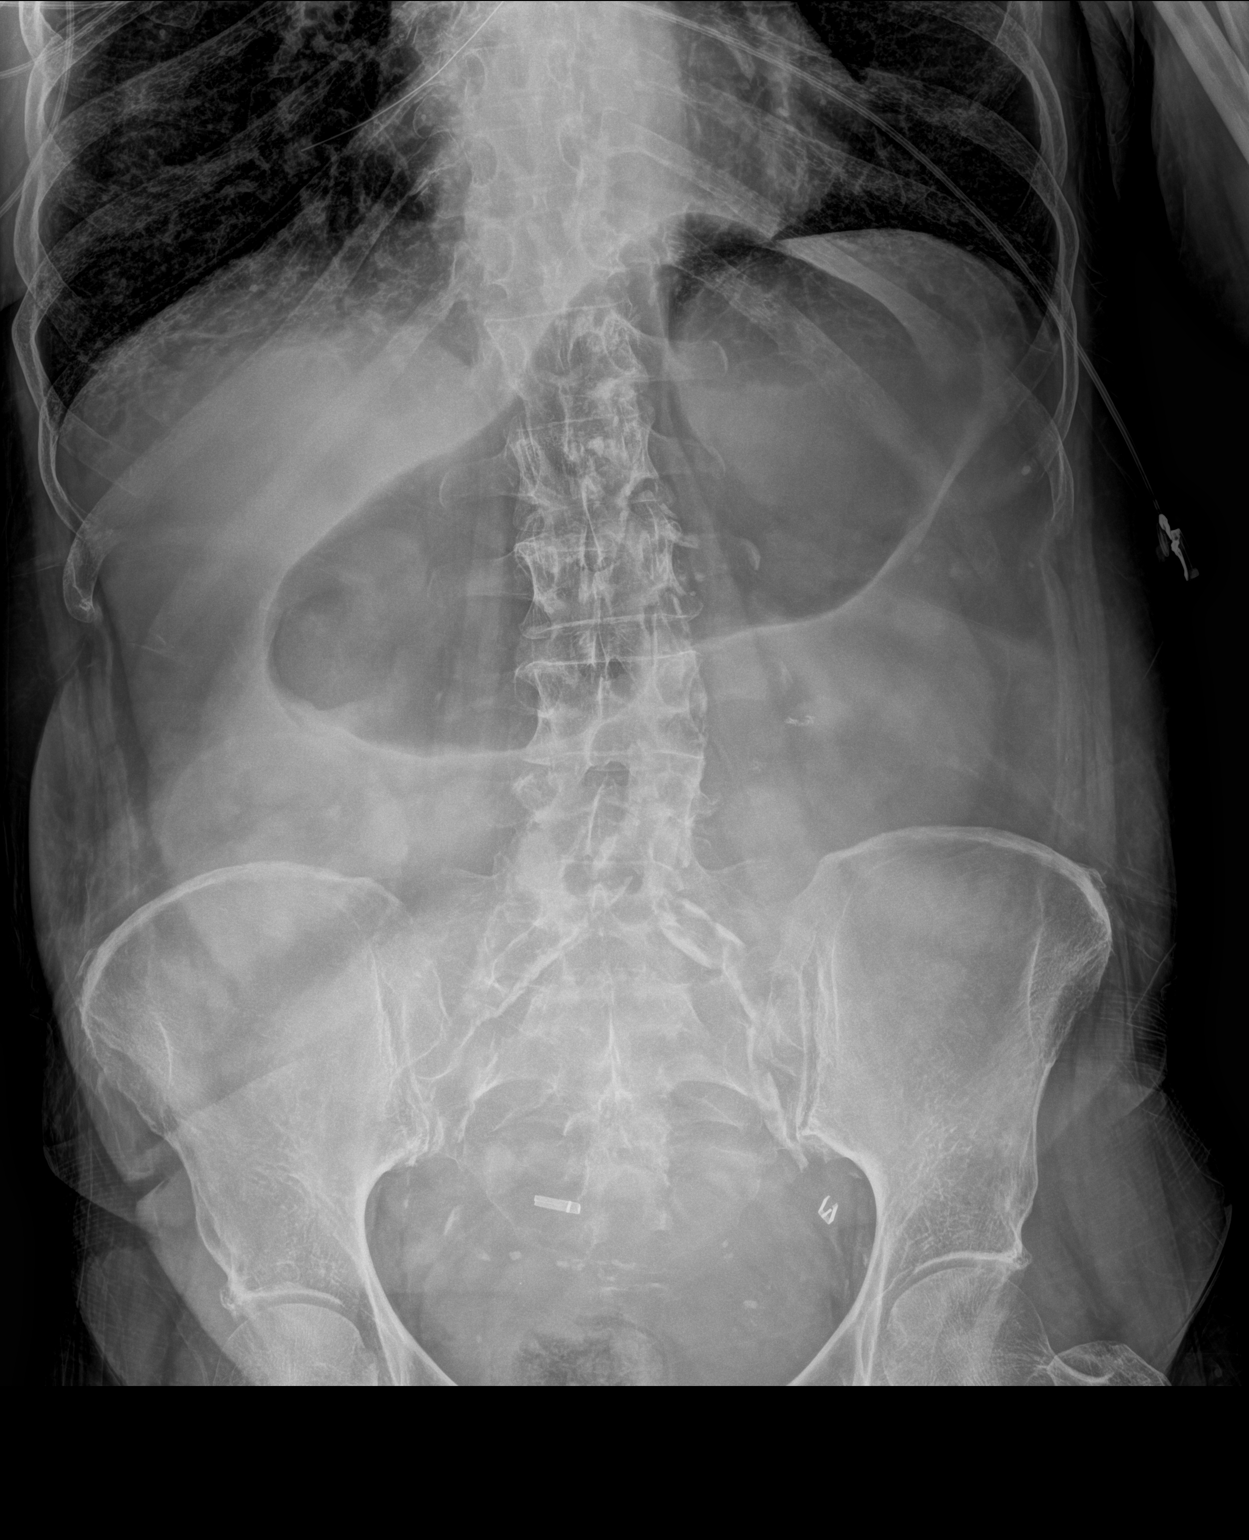

[1 of 1 positions shown; findings below may reference images not displayed]

FINDINGS: Stomach is distended with air. No obstructive changes are seen. No
free air is noted. Diffuse aortic calcifications are noted without
aneurysmal dilatation. Tubal ligation clips are seen. Degenerative
changes of lumbar spine are noted.
IMPRESSION: No acute abnormality seen.

## 2021-03-08 ENCOUNTER — Inpatient Hospital Stay: Payer: Medicare Other | Attending: Internal Medicine

## 2021-03-08 ENCOUNTER — Inpatient Hospital Stay: Payer: Medicare Other

## 2021-03-08 DIAGNOSIS — I129 Hypertensive chronic kidney disease with stage 1 through stage 4 chronic kidney disease, or unspecified chronic kidney disease: Secondary | ICD-10-CM | POA: Insufficient documentation

## 2021-03-08 DIAGNOSIS — Z79899 Other long term (current) drug therapy: Secondary | ICD-10-CM | POA: Diagnosis not present

## 2021-03-08 DIAGNOSIS — N189 Chronic kidney disease, unspecified: Secondary | ICD-10-CM | POA: Insufficient documentation

## 2021-03-08 DIAGNOSIS — C155 Malignant neoplasm of lower third of esophagus: Secondary | ICD-10-CM

## 2021-03-08 DIAGNOSIS — D631 Anemia in chronic kidney disease: Secondary | ICD-10-CM | POA: Insufficient documentation

## 2021-03-08 LAB — HEMATOCRIT: HCT: 34.5 % — ABNORMAL LOW (ref 36.0–46.0)

## 2021-03-08 LAB — HEMOGLOBIN: Hemoglobin: 11.3 g/dL — ABNORMAL LOW (ref 12.0–15.0)

## 2021-03-15 IMAGING — CT CT RENAL STONE PROTOCOL
2 of 4 series · 16 of 46 positions shown, 18 images · non-contrast
Comparison: 09/27/2019 renal ultrasound. Abdominopelvic CT of
04/29/2019

CLINICAL DATA: Acute renal failure. Right-sided back pain and
dizziness. Lung cancer and breast cancer.

EXAM:
CT ABDOMEN AND PELVIS WITHOUT CONTRAST
TECHNIQUE: Multidetector CT imaging of the abdomen and pelvis was performed
following the standard protocol without IV contrast.

[Series 2: stone full standard · axial · 0.73mm/px · z∈[-375,-40]mm · 13 of 73 slices shown, 15 images]
[im 3/73  soft-tissue]
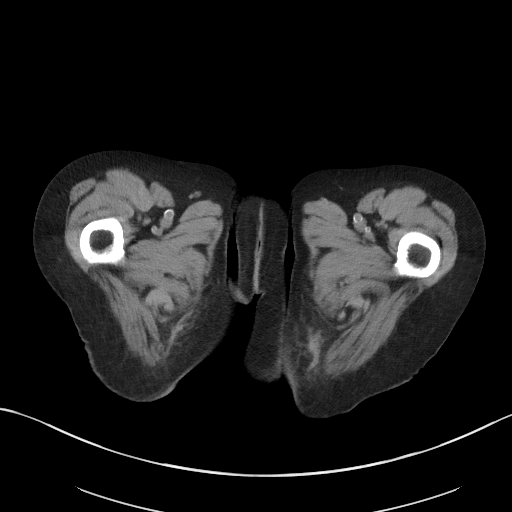
[im 3/73  bone]
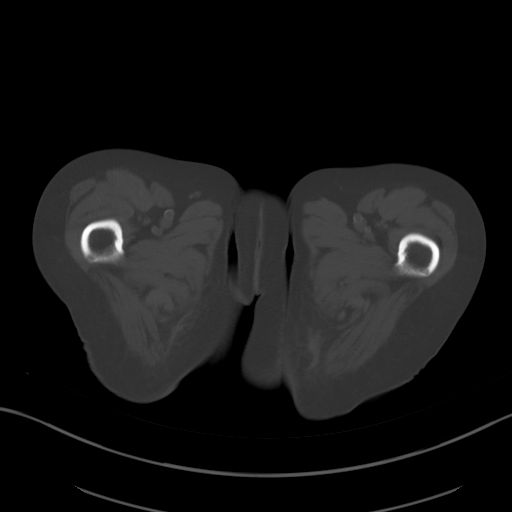
[im 9/73  soft-tissue]
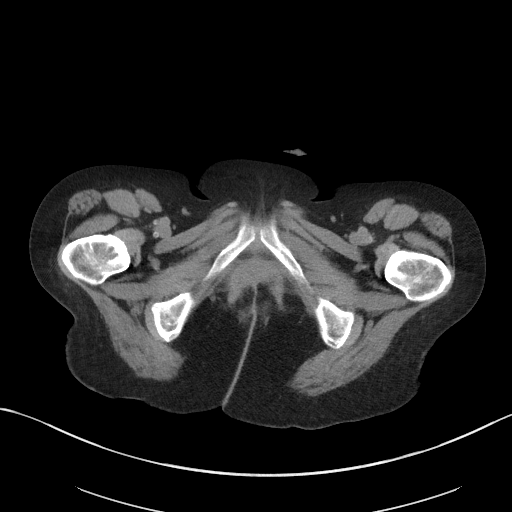
[im 15/73  soft-tissue]
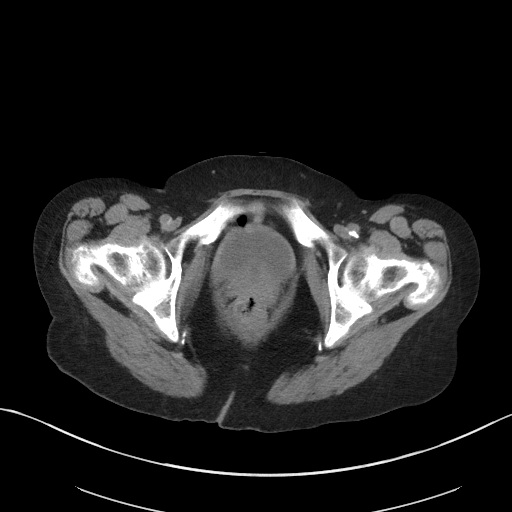
[im 21/73  soft-tissue]
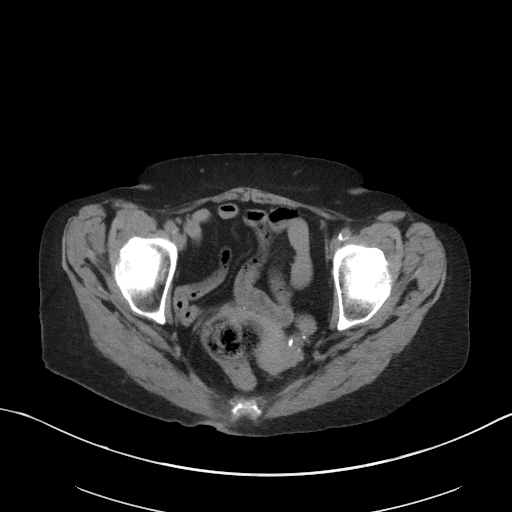
[im 26/73  soft-tissue]
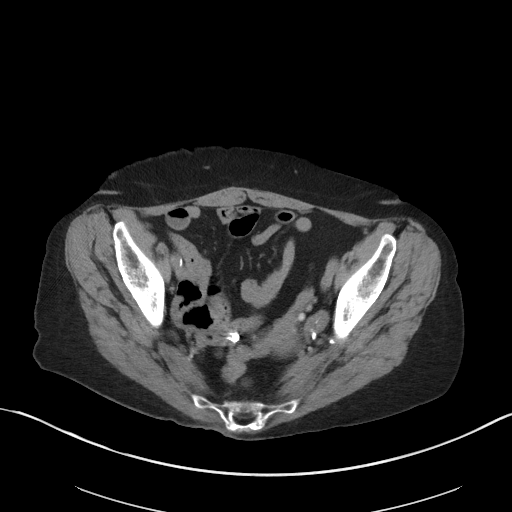
[im 32/73  soft-tissue]
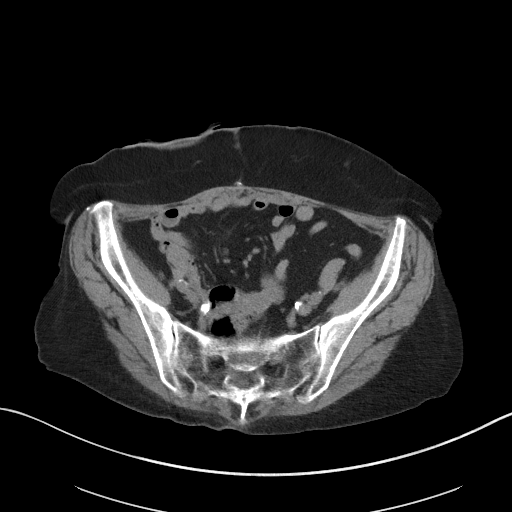
[im 38/73  soft-tissue]
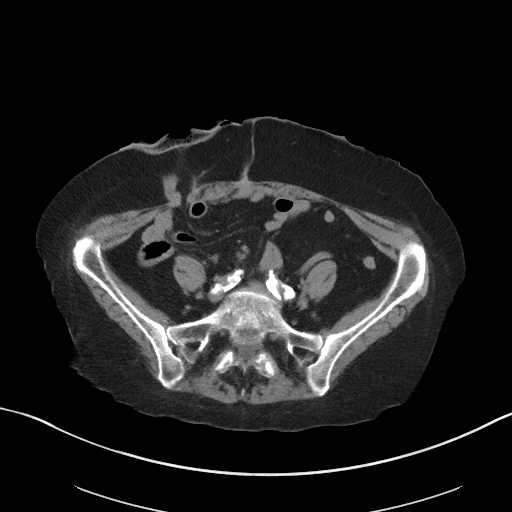
[im 41/73  soft-tissue]
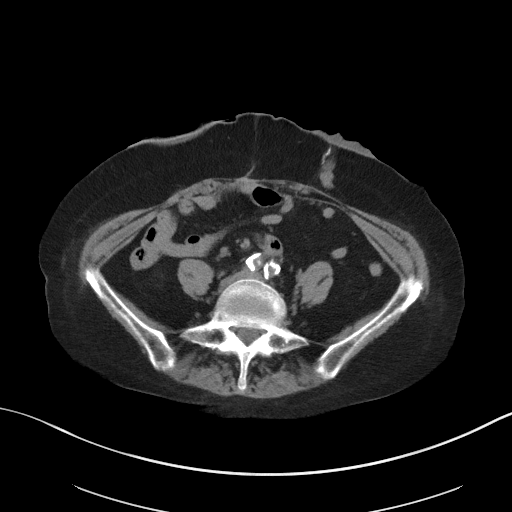
[im 47/73  soft-tissue]
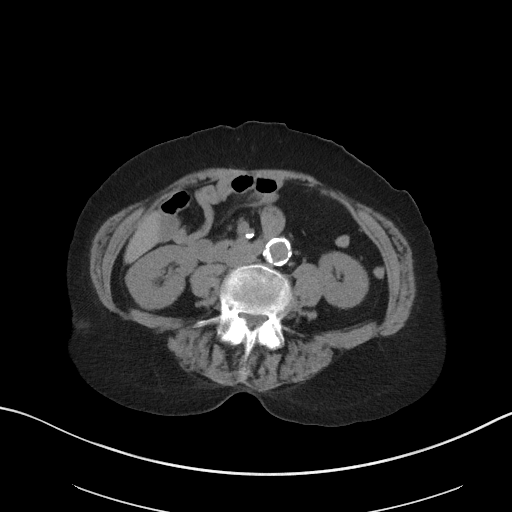
[im 47/73  bone]
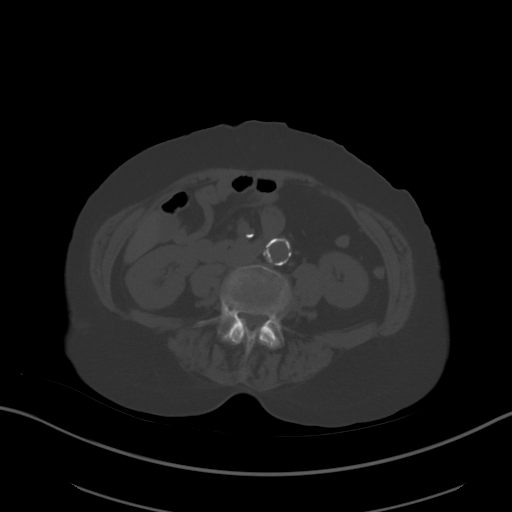
[im 52/73  soft-tissue]
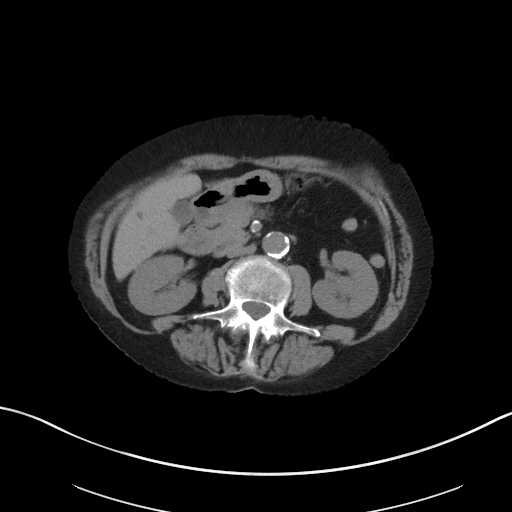
[im 58/73  soft-tissue]
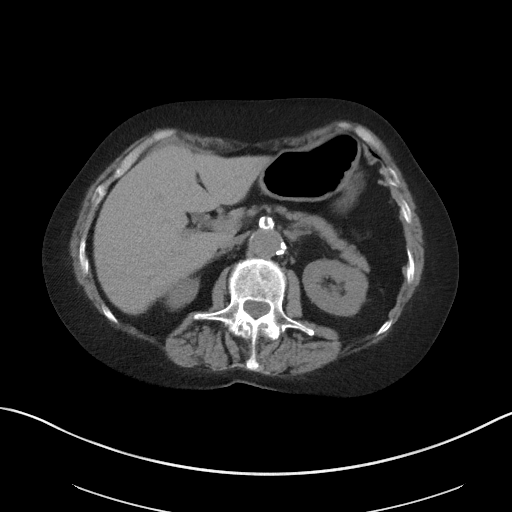
[im 64/73  soft-tissue]
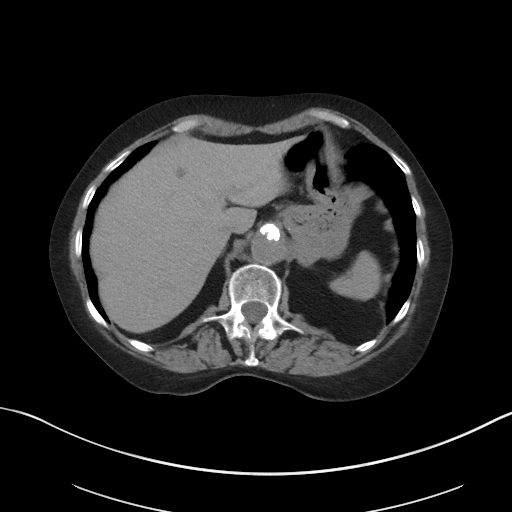
[im 70/73  soft-tissue]
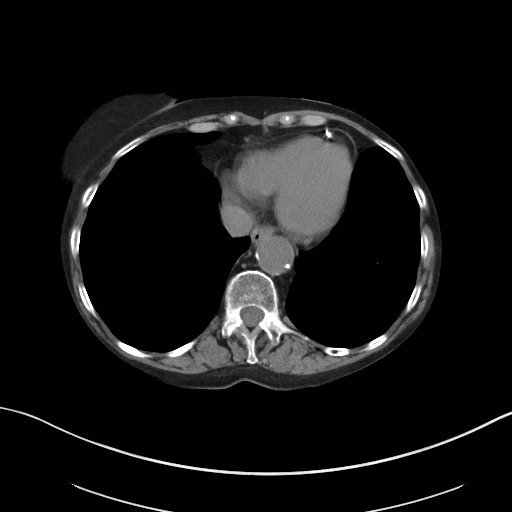

[Series 5: coronal · coronal · 0.66mm/px · 3 of 127 slices shown]
[im 43/127  soft-tissue]
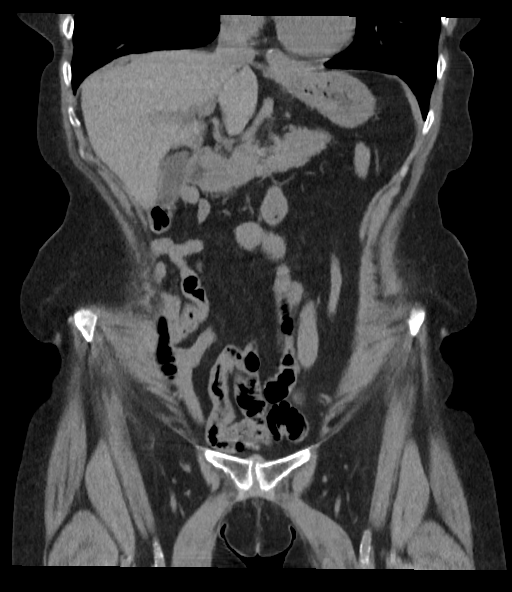
[im 57/127  soft-tissue]
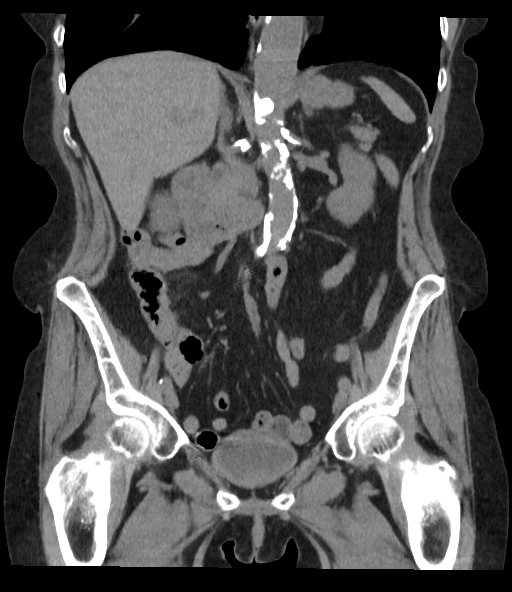
[im 71/127  soft-tissue]
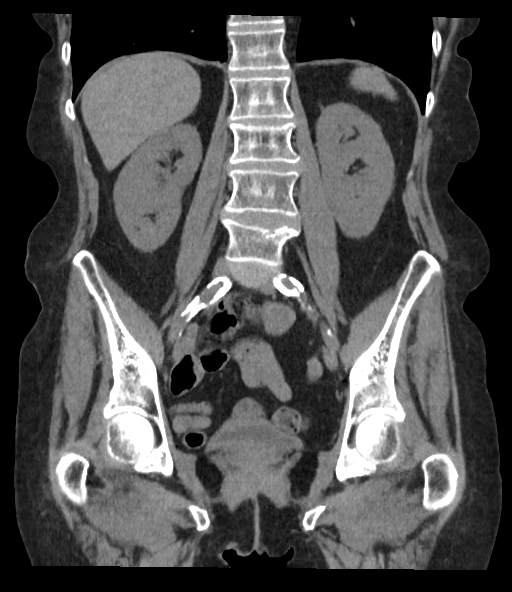

[16 of 46 positions shown; findings below may reference images not displayed]

FINDINGS: Lower chest: Clear lung bases. Normal heart size without pericardial
or pleural effusion. Multivessel coronary artery atherosclerosis.

Hepatobiliary: Hepatic cysts. Normal gallbladder, without biliary
ductal dilatation.

Pancreas: Mild pancreatic atrophy, without duct dilatation or acute
inflammation.

Spleen: Normal in size, without focal abnormality.

Adrenals/Urinary Tract: Normal adrenal glands. No renal calculi or
hydronephrosis. No hydroureter or ureteric calculi. No bladder
calculi.

Stomach/Bowel: Normal stomach, without wall thickening. Moderate
amount of stool within the rectum. Scattered colonic diverticula.
Left abdominal mucous fistula. Right abdominal end colostomy. Normal
terminal ileum. Normal small bowel caliber.

Vascular/Lymphatic: Advanced aortic and branch vessel
atherosclerosis. No abdominopelvic adenopathy.

Reproductive: Normal uterus and adnexa.

Other: No significant free fluid. Mild pelvic floor laxity. No
evidence of omental or peritoneal disease.

Nonspecific subcutaneous nodule about the anterior right abdominal
wall at 8 mm on [DATE], new. There is also subcutaneous nodule
superficial the right gluteals of 11 mm on [DATE], new.

Musculoskeletal: Mild osteopenia. L3 and L5 mild superior endplate
compression deformities, similar to on the prior. This presumes a
transitional S1 vertebral body.
IMPRESSION: 1.  No urinary tract calculi or hydronephrosis.
2. Otherwise, low sensitivity exam secondary to stone study
technique.
3. L3 and L5 mild superior endplate compression deformities,
chronic.
4. Extensive surgical changes within the colon, without acute
complication.
5. Coronary artery atherosclerosis. Aortic Atherosclerosis
([id number]).
6. New subcutaneous nodularity about the abdomen and right gluteal
region. Correlate with sites of injections and consider physical
exam.

## 2021-03-17 IMAGING — CT CT ABD-PELV W/O CM
2 of 4 series · 16 of 46 positions shown, 18 images · non-contrast
Comparison: 10/29/2019

CLINICAL DATA: Abdominal cramping

EXAM:
CT ABDOMEN AND PELVIS WITHOUT CONTRAST
TECHNIQUE: Multidetector CT imaging of the abdomen and pelvis was performed
following the standard protocol without IV contrast.

[Series 2: routine abd/pel wo · axial · 0.80mm/px · z∈[-1270,-925]mm · 13 of 77 slices shown, 15 images]
[im 4/77  soft-tissue]
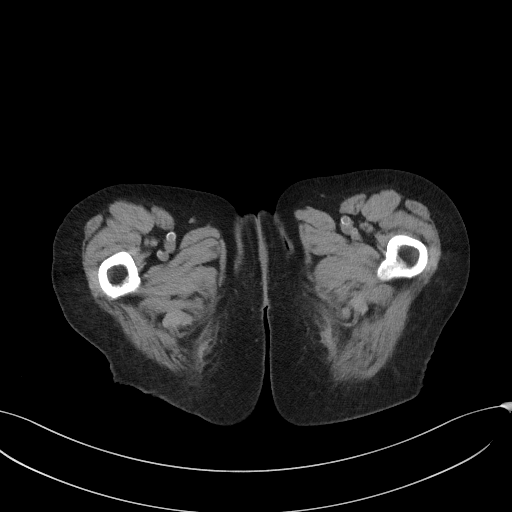
[im 4/77  bone]
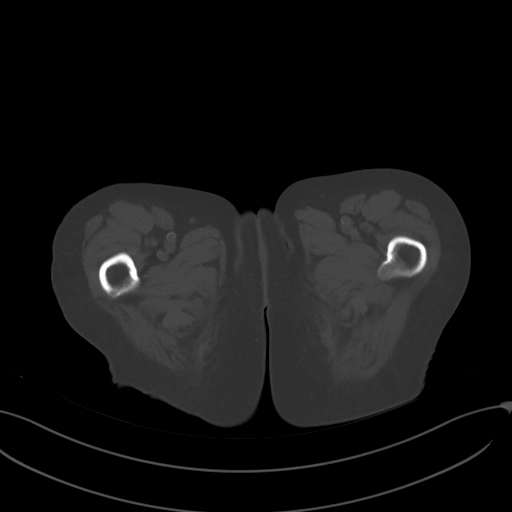
[im 10/77  soft-tissue]
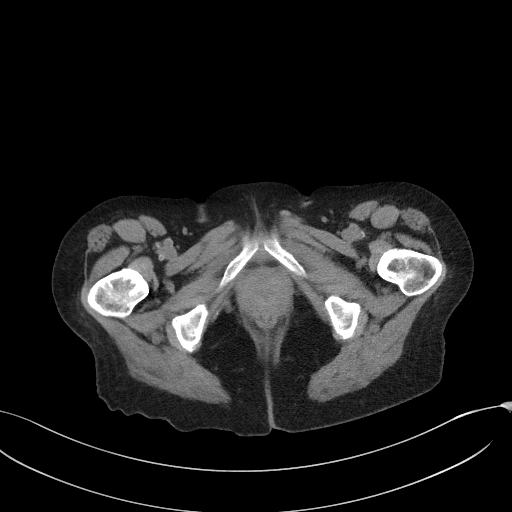
[im 16/77  soft-tissue]
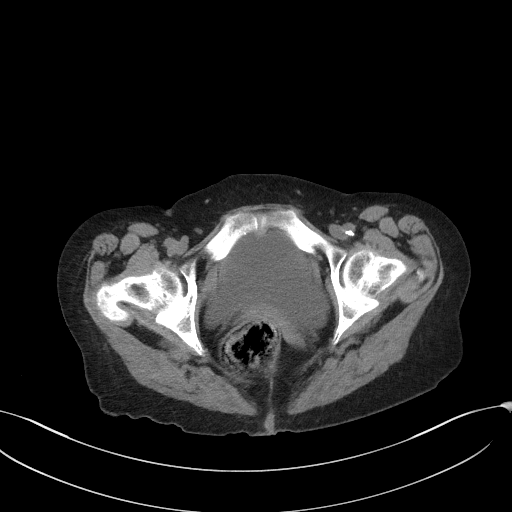
[im 23/77  soft-tissue]
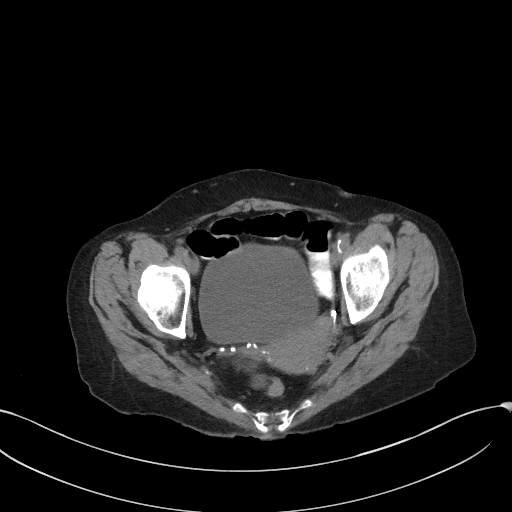
[im 26/77  soft-tissue]
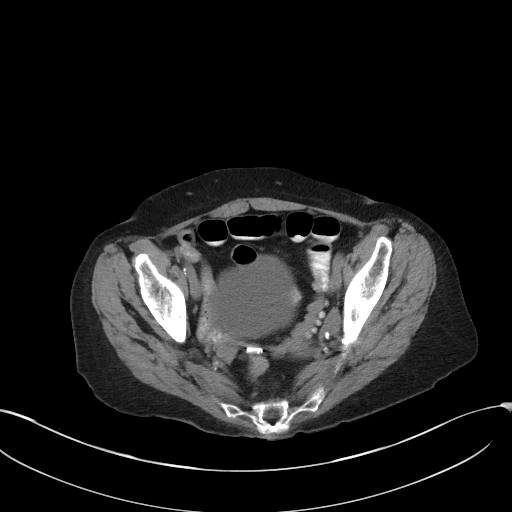
[im 32/77  soft-tissue]
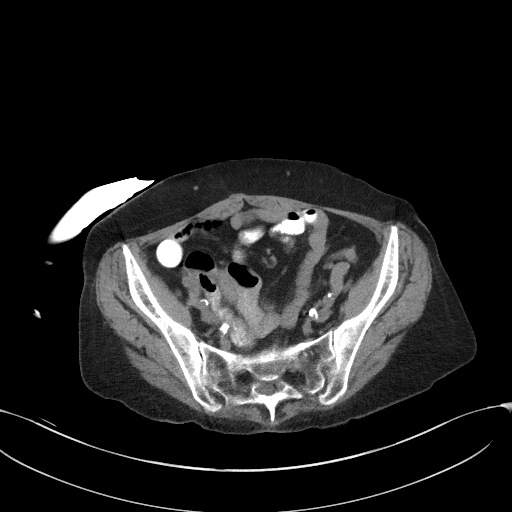
[im 39/77  soft-tissue]
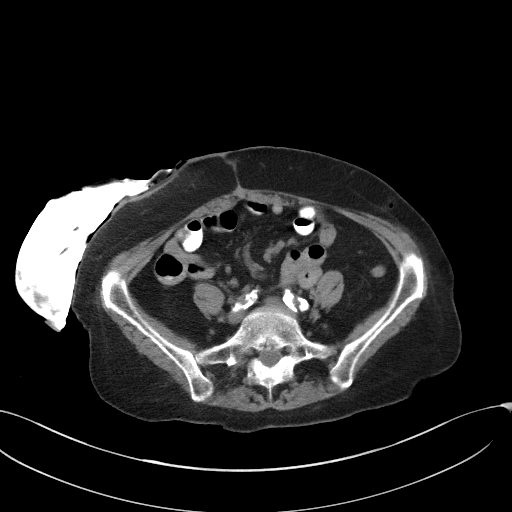
[im 45/77  soft-tissue]
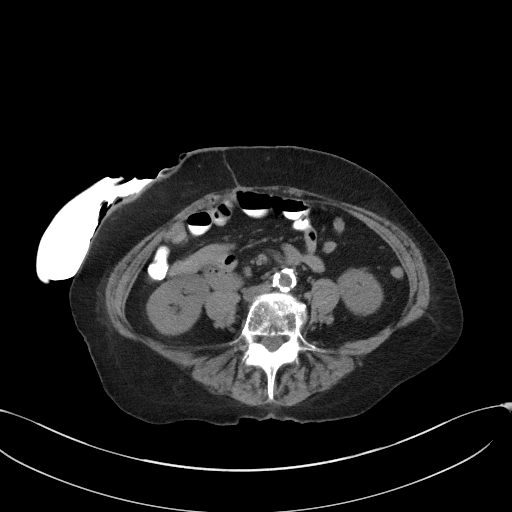
[im 51/77  soft-tissue]
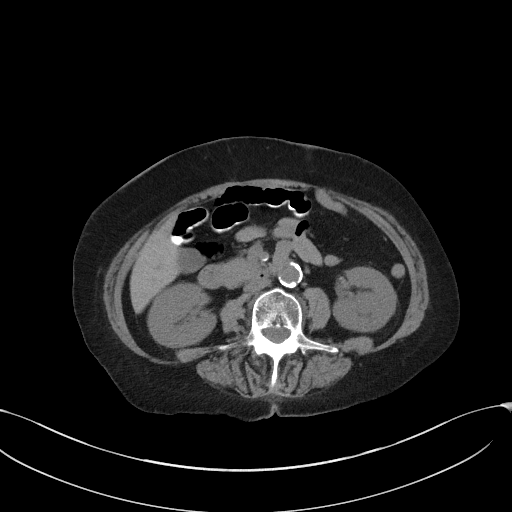
[im 51/77  bone]
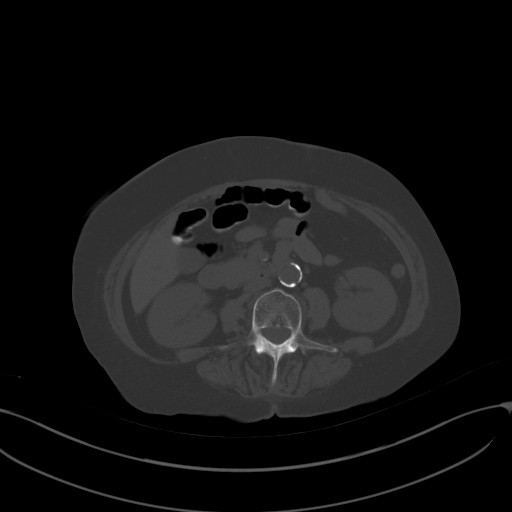
[im 54/77  soft-tissue]
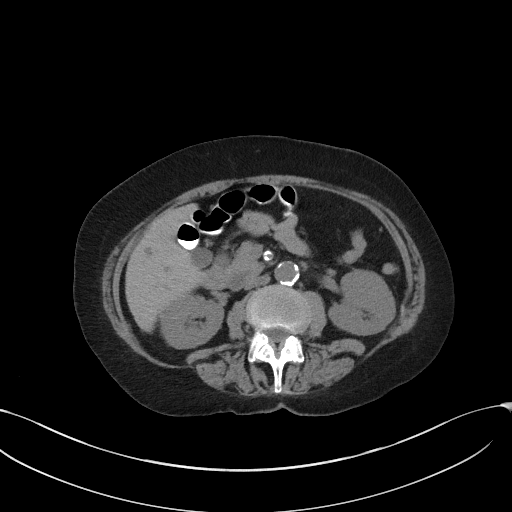
[im 61/77  soft-tissue]
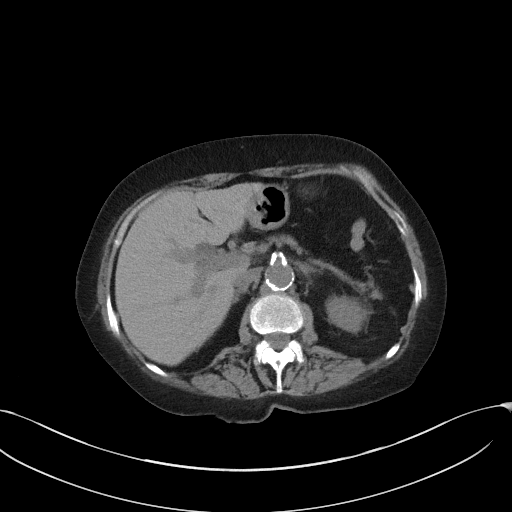
[im 67/77  soft-tissue]
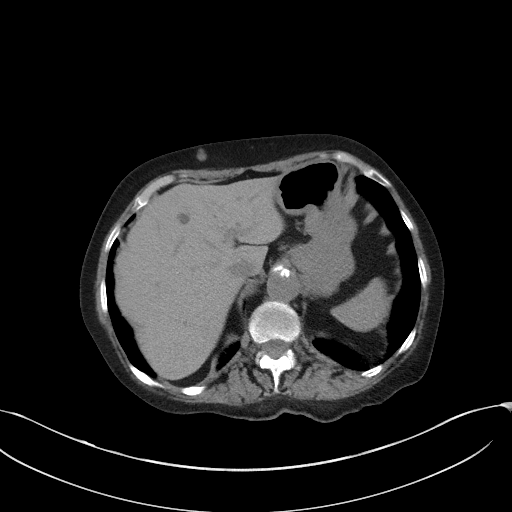
[im 73/77  soft-tissue]
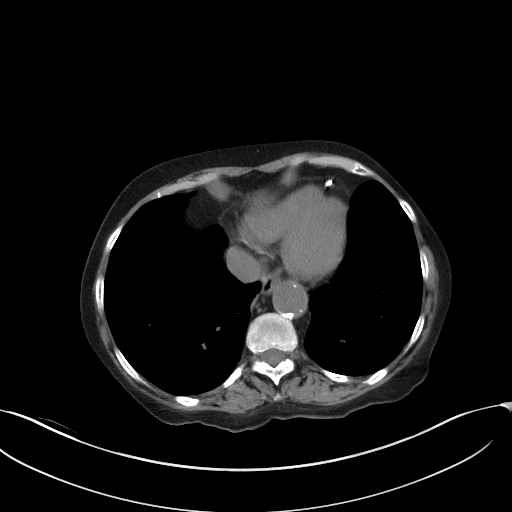

[Series 5: coronal st · coronal · 0.73mm/px · 3 of 82 slices shown]
[im 28/82  soft-tissue]
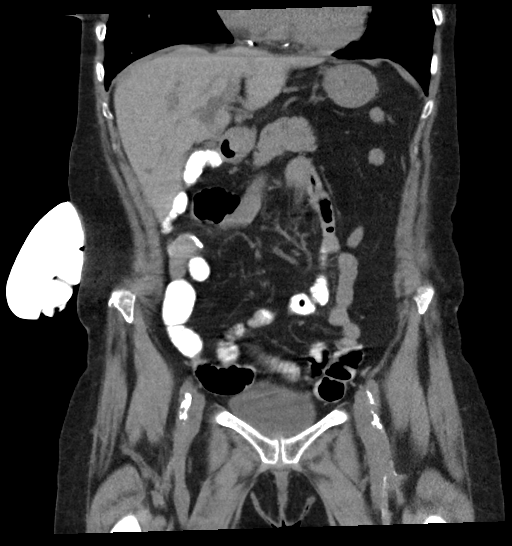
[im 37/82  soft-tissue]
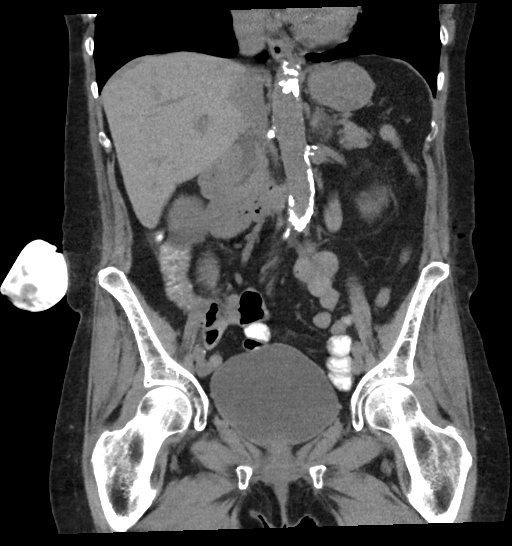
[im 46/82  soft-tissue]
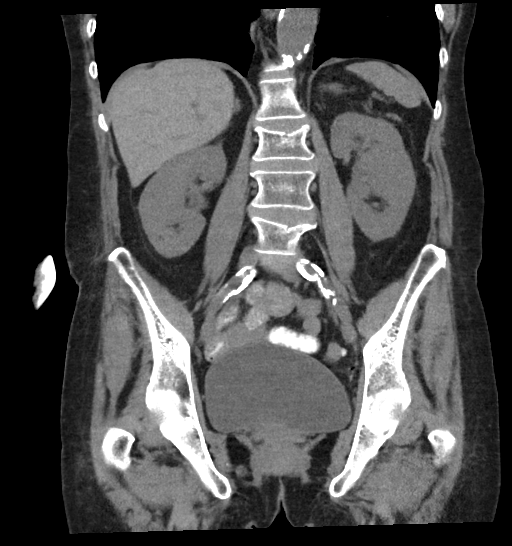

[16 of 46 positions shown; findings below may reference images not displayed]

FINDINGS: Lower chest: Lung bases are clear. Heart size is normal. Coronary
artery calcifications evident.

Hepatobiliary: Stable size and appearance of small low-density
lesions within the liver, which may reflect cysts versus
hemangiomas. Gallbladder appears unremarkable. No gallstone. No
biliary dilatation.

Pancreas: Unremarkable. No pancreatic ductal dilatation or
surrounding inflammatory changes.

Spleen: Normal in size without focal abnormality.

Adrenals/Urinary Tract: Adrenal glands are unremarkable. Kidneys are
normal, without renal calculi, focal lesion, or hydronephrosis.
Bladder is unremarkable.

Stomach/Bowel: Postsurgical changes from mucous fistula in the left
lower quadrant and right lower quadrant ileostomy. Enteric contrast
is present within small bowel loops and extending into the
ileostomy. No evidence of bowel obstruction. Sigmoid diverticulosis.
No focal bowel wall thickening or inflammatory changes are seen.

Vascular/Lymphatic: Extensive atherosclerotic calcification of the
abdominal aorta and its branches. No abdominopelvic lymphadenopathy.

Reproductive: Uterus and bilateral adnexa are unremarkable.

Other: No free fluid within the abdomen or pelvis. No
pneumoperitoneum. Nodular focus within the subcutaneous soft tissues
of the upper abdomen right of midline (series 2, image 11) and right
gluteal region (series 2, image 30), which may reflect injection
related changes.

Musculoskeletal: Stable mild lumbar compression deformities. No new
or acute osseous findings.
IMPRESSION: 1. No acute findings in the abdomen or pelvis.
2. Extensive postsurgical changes of the bowel. No evidence of bowel
obstruction.
3. Sigmoid diverticulosis without evidence of acute diverticulitis.
4. Extensive atherosclerosis.

## 2021-03-20 IMAGING — DX DG CHEST 1V PORT
1 series · 1 of 1 positions shown · non-contrast
Comparison: 09/26/2019

CLINICAL DATA: Shortness of breath

EXAM:
PORTABLE CHEST 1 VIEW

[chest ap]
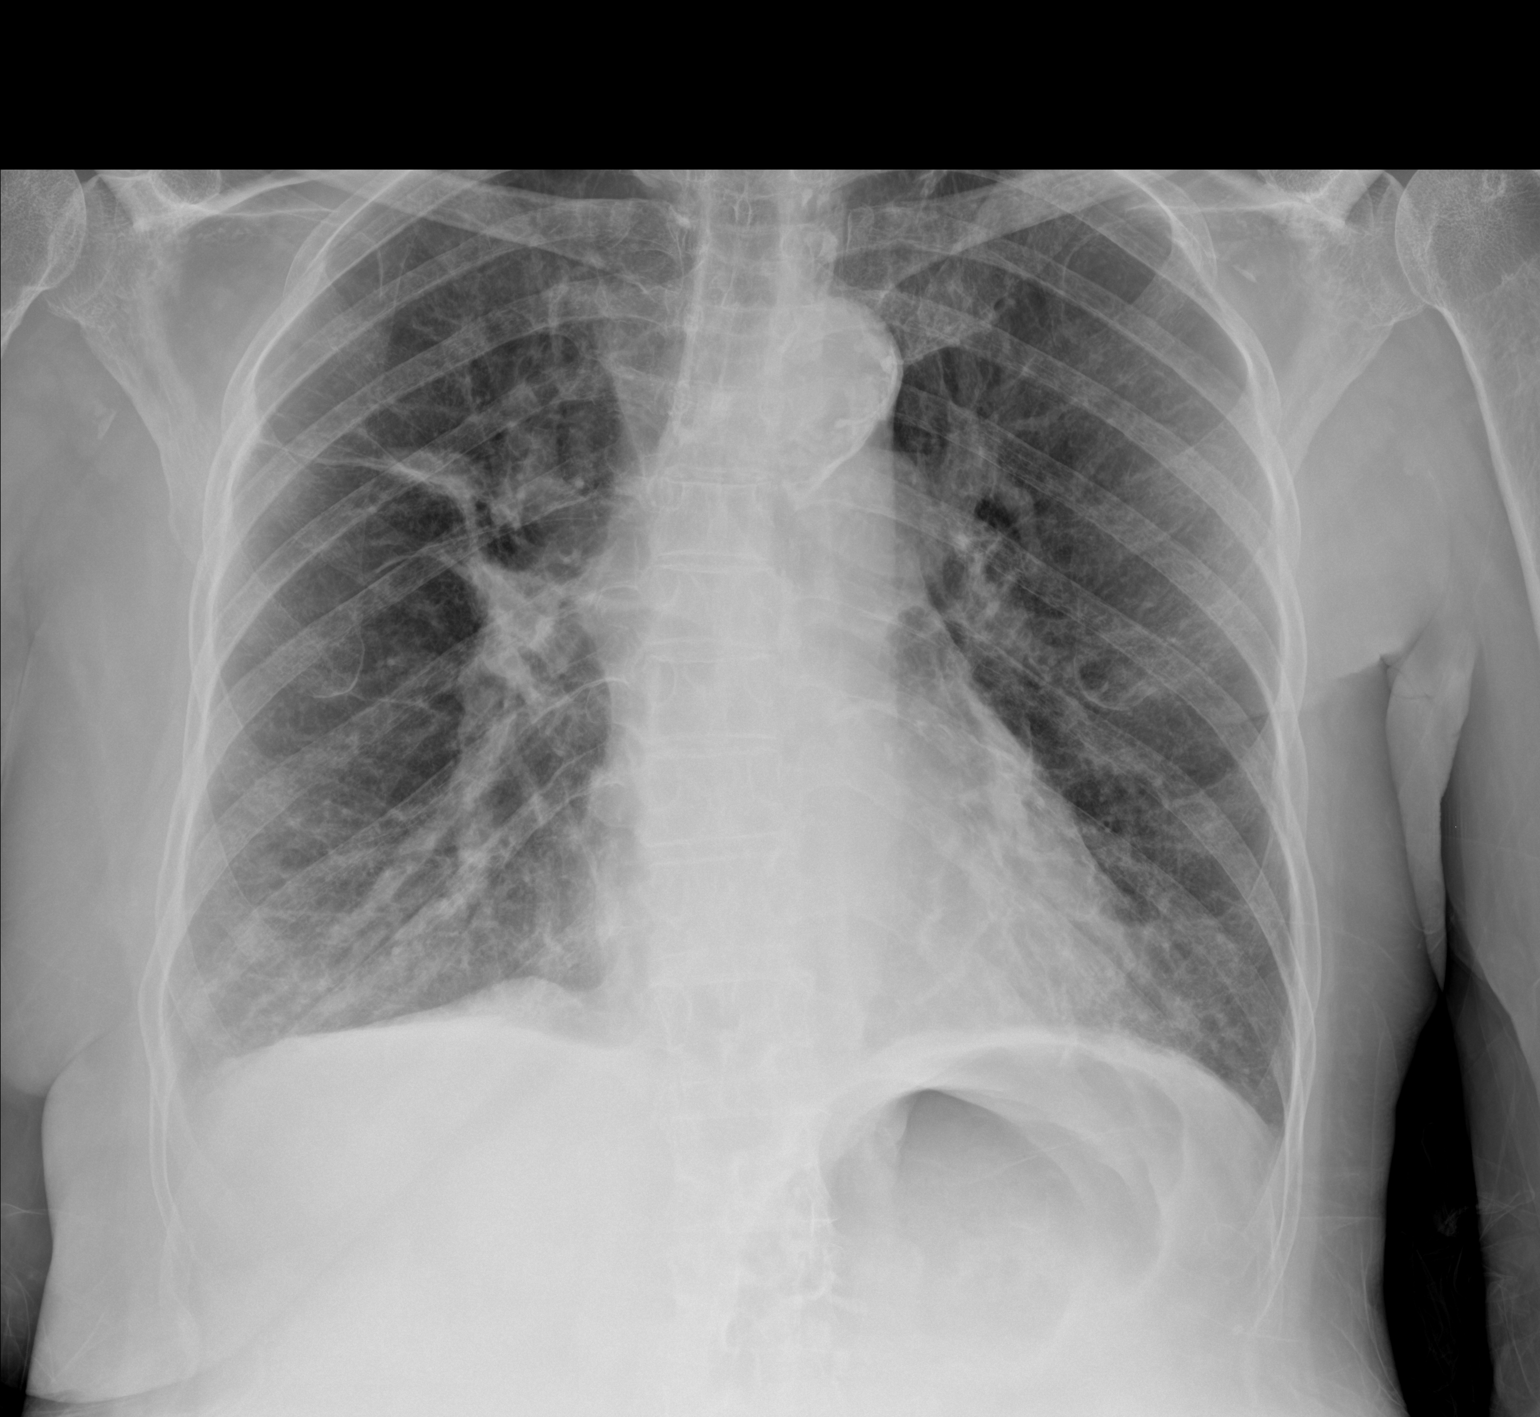

[1 of 1 positions shown; findings below may reference images not displayed]

FINDINGS: Cardiac shadow is within normal limits. Aortic calcifications are
again seen. Diffuse prominent interstitial markings are again seen.
Scarring in the right upper lobe is noted stable from the prior
exam. Mild bibasilar opacities are noted right greater than left
consistent with the given clinical history. No bony abnormality is
noted.
IMPRESSION: Bibasilar opacities consistent with the given clinical history of
[id number] positivity.

Chronic scarring in the right upper lobe.

## 2021-03-23 IMAGING — CR DG CHEST 2V
1 series · 2 of 2 positions shown · non-contrast
Comparison: Chest x-ray 11/03/2019, 06/19/2019.  CT 11/19/2018.

CLINICAL DATA: Chest pain. Shortness of breath. [id number] positive.

EXAM:
CHEST - 2 VIEW

[Series 1: dg chest 2 view · 0.14mm/px · 2 of 2 slices shown]
[im 1/2]
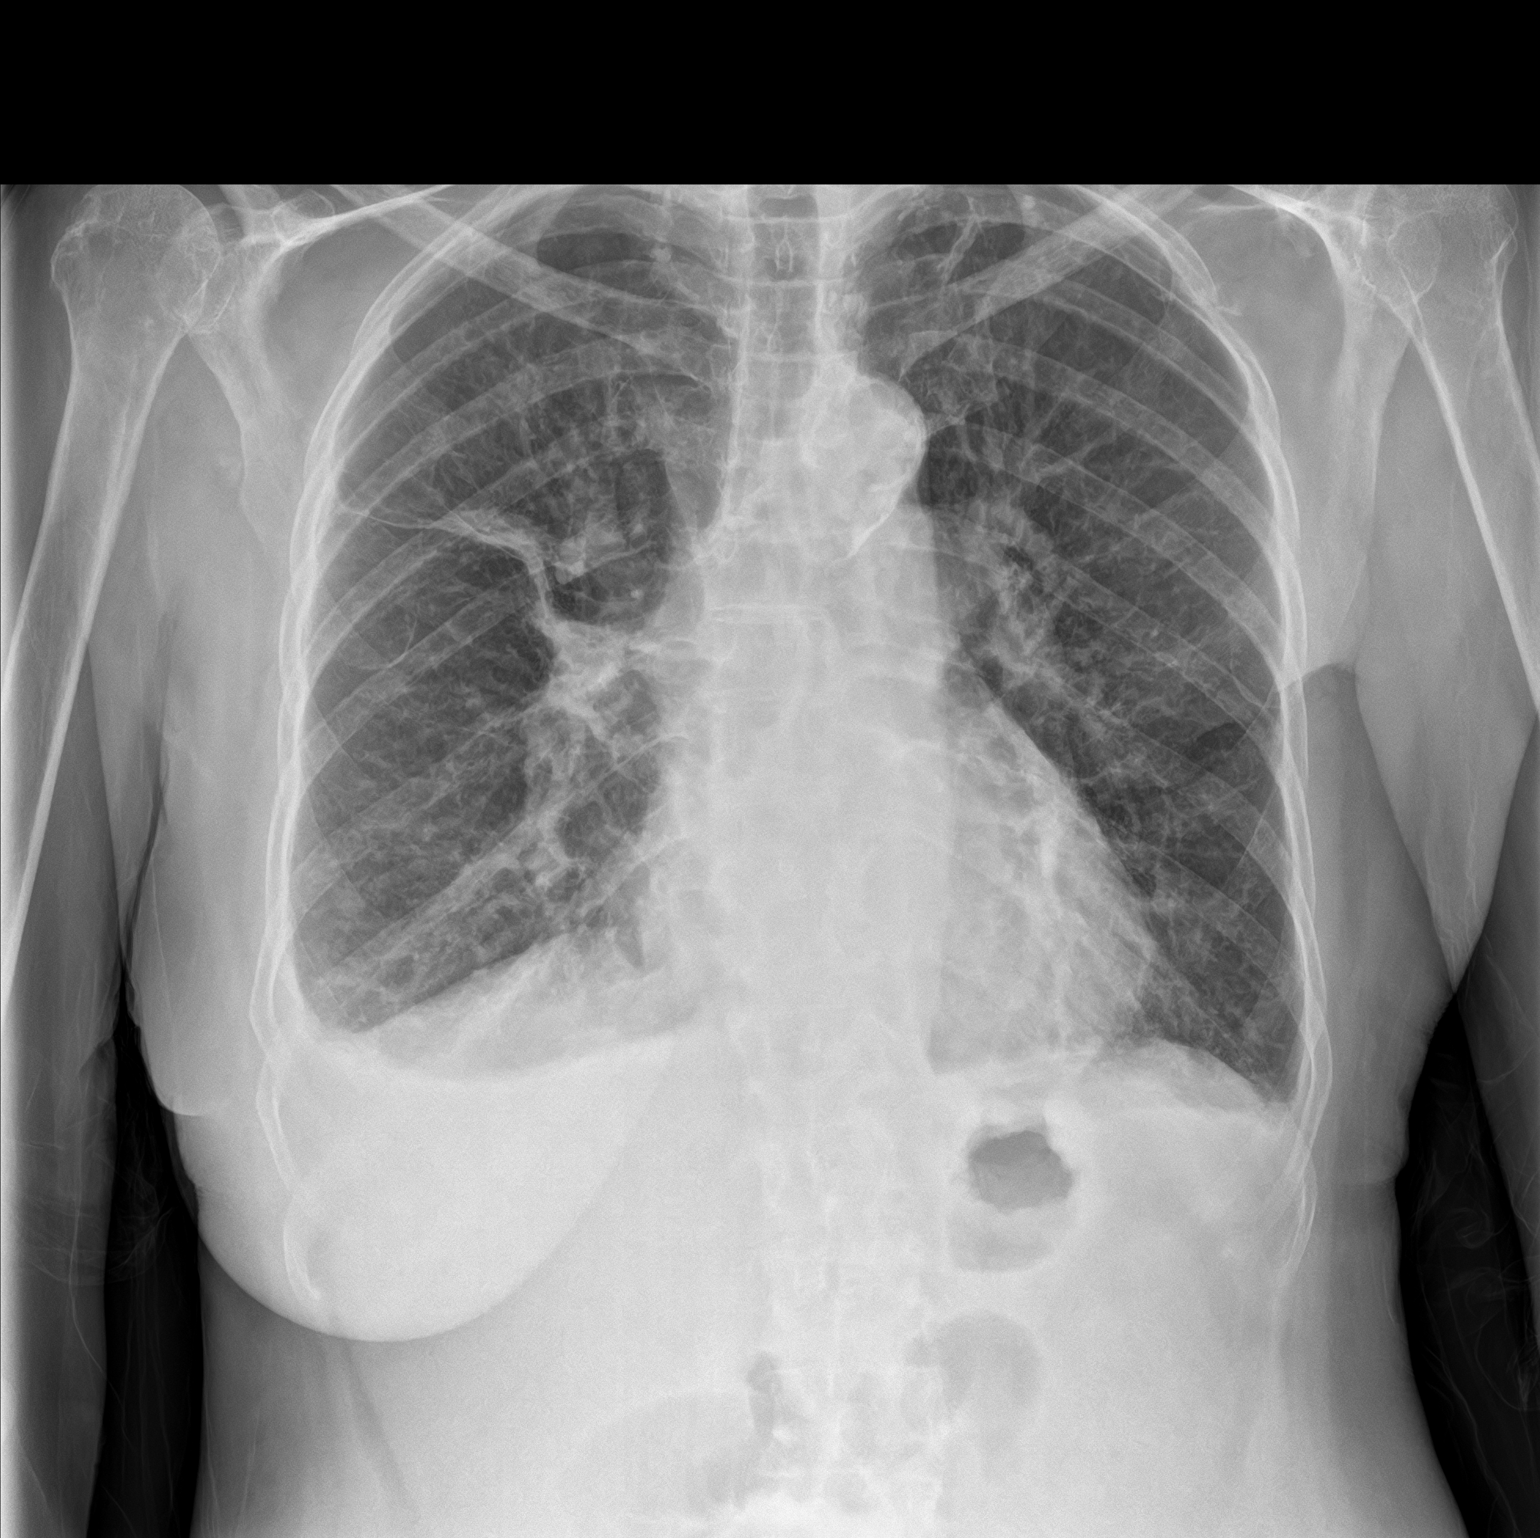
[im 2/2]
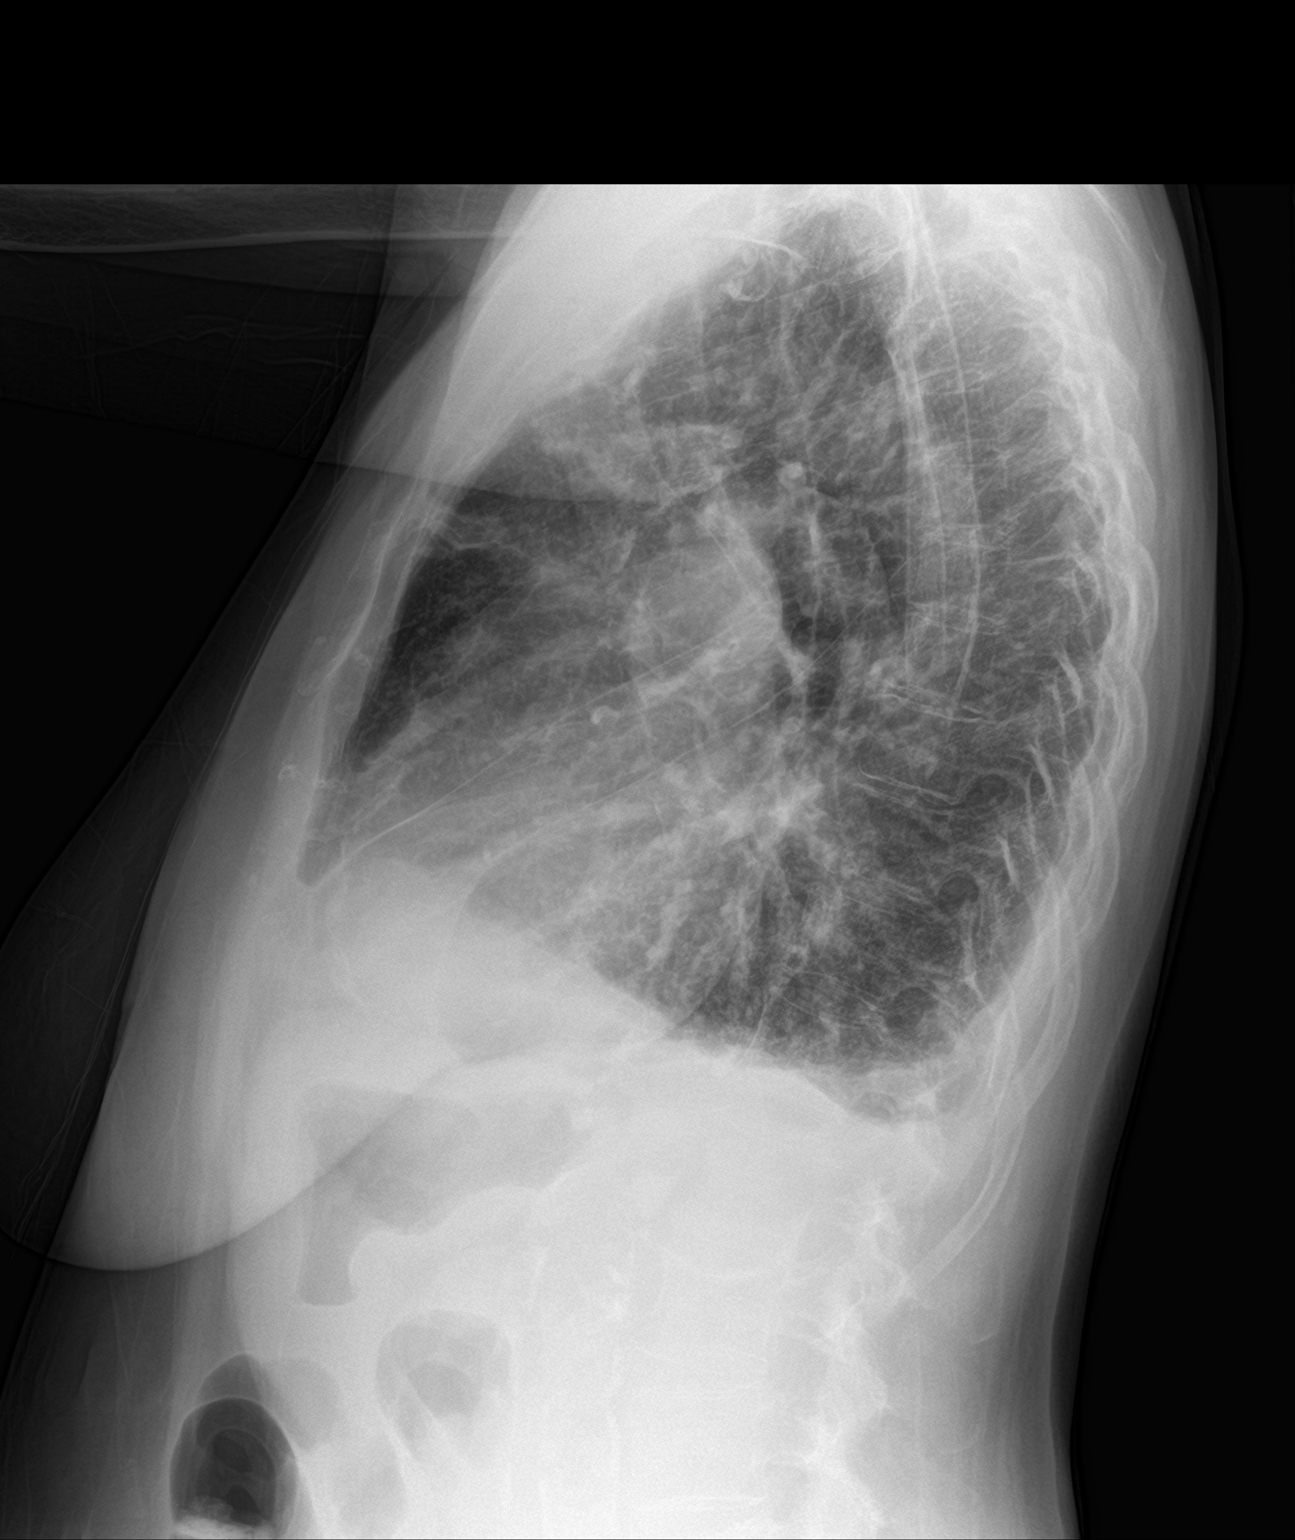

[2 of 2 positions shown; findings below may reference images not displayed]

FINDINGS: Mediastinum and hilar structures normal. Stable changes of scarring
right upper lung. Bibasilar interstitial prominence with nodularity.
These changes may be related pneumonitis. Follow-up exam to
demonstrate clearing suggested. New small bilateral pleural
effusions, right side greater than left. No pneumothorax.
IMPRESSION: 1. Bibasilar interstitial prominence with nodularity. These changes
may be related to pneumonitis. Follow-up exams suggested to
demonstrate resolution.

2. New small bilateral pleural effusions, right side greater than
left.

3.   Persistent changes of scarring right upper lung.

## 2021-04-05 ENCOUNTER — Inpatient Hospital Stay: Payer: Medicare Other

## 2021-04-05 ENCOUNTER — Other Ambulatory Visit: Payer: Self-pay

## 2021-04-05 ENCOUNTER — Inpatient Hospital Stay: Payer: Medicare Other | Attending: Internal Medicine

## 2021-04-05 DIAGNOSIS — C155 Malignant neoplasm of lower third of esophagus: Secondary | ICD-10-CM | POA: Insufficient documentation

## 2021-04-05 LAB — HEMATOCRIT: HCT: 29.5 % — ABNORMAL LOW (ref 36.0–46.0)

## 2021-04-05 LAB — HEMOGLOBIN: Hemoglobin: 10 g/dL — ABNORMAL LOW (ref 12.0–15.0)

## 2021-04-17 IMAGING — PT NM PET TUM IMG RESTAG (PS) SKULL BASE T - THIGH
10 series · 23 of 25 positions shown · non-contrast
Comparison: PET-CT 05/12/2019

CLINICAL DATA: Subsequent treatment strategy for malignant neoplasm
of the lower third of the esophagus. Additional history of breast
cancer and lung cancer.

EXAM:
NUCLEAR MEDICINE PET SKULL BASE TO THIGH
TECHNIQUE: 6.4 mCi F-18 FDG was injected intravenously. Full-ring PET imaging
was performed from the skull base to thigh after the radiotracer. CT
data was obtained and used for attenuation correction and anatomic
localization.
Fasting blood glucose:  84 mg/dl

[Series 3: ct wb 5.0 b30f · axial · 5.0mm · 0.98mm/px · z∈[-858,-108]mm · 3 of 251 slices shown]
[im 1/251]
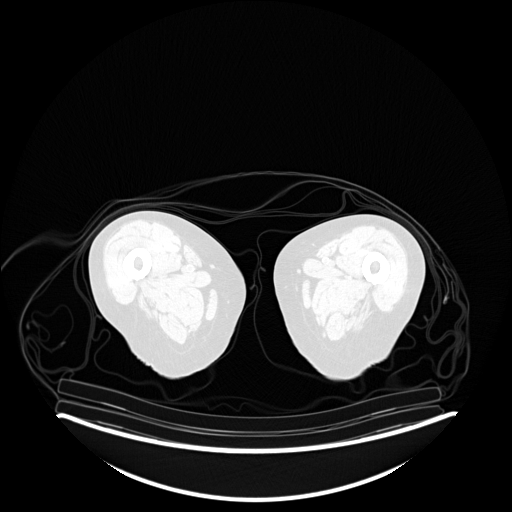
[im 126/251]
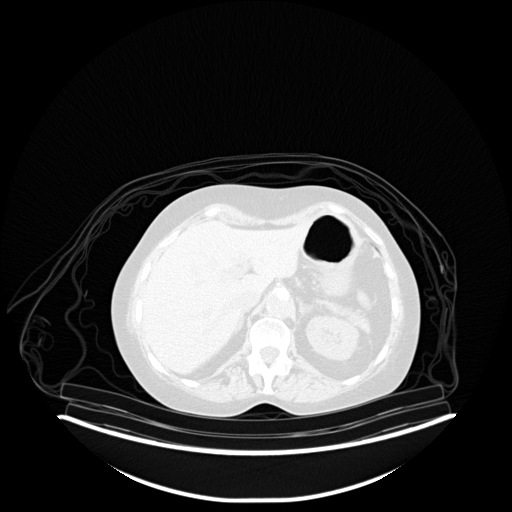
[im 251/251  brain]
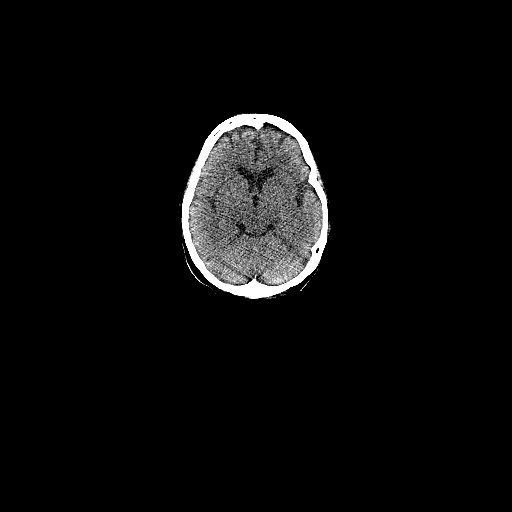

[Series 4: pet wb (ac) · axial · 5.0mm · 2.72mm/px · z∈[-858,-108]mm · 3 of 251 slices shown]
[im 1/251]
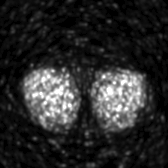
[im 126/251]
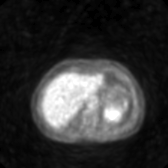
[im 251/251]
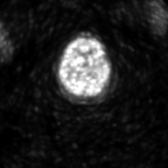

[Series 5: pet wb uncorrected (nac) · axial · 5.0mm · 4.07mm/px · z∈[-482,-108]mm · 2 of 251 slices shown]
[im 126/251]
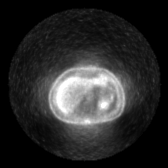
[im 251/251]
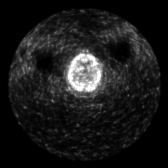

[Series 603: fused axial · 4 of 249 slices shown]
[im 1/249]
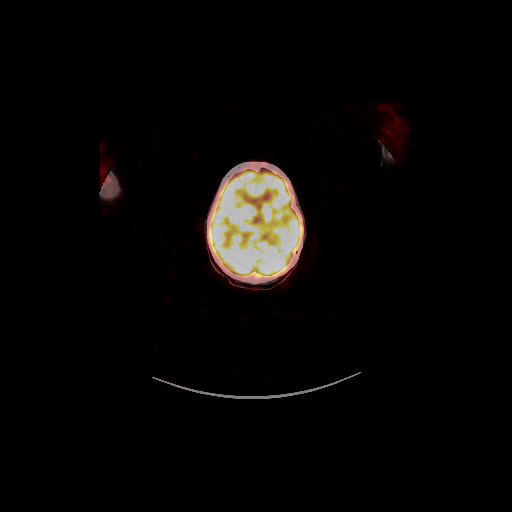
[im 83/249]
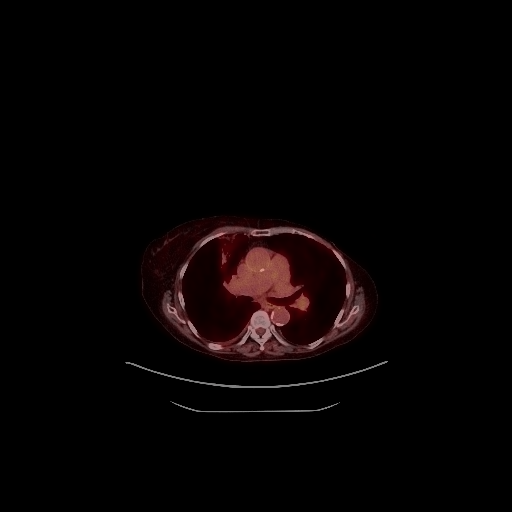
[im 166/249]
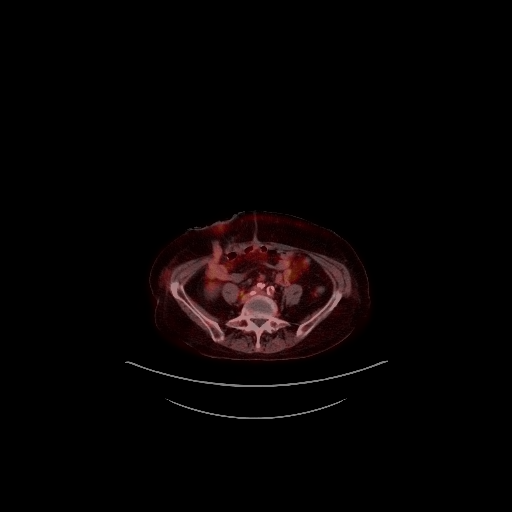
[im 249/249]
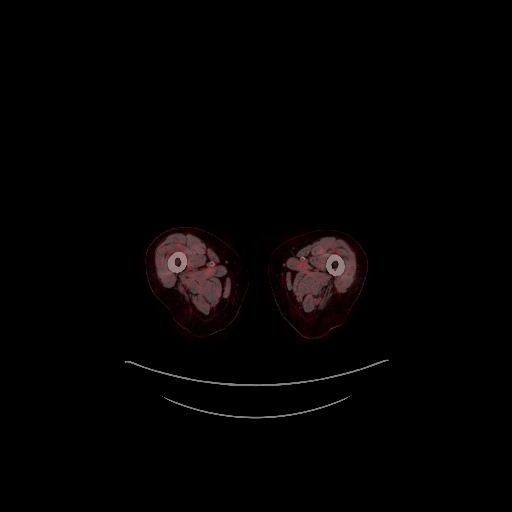

[Series 604: fused coronal · 1 of 91 slices shown]
[im 1/91]
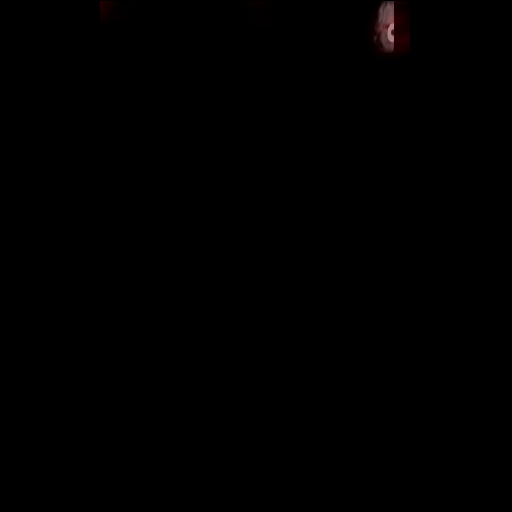

[Series 605: fused sagittal · 2 of 127 slices shown]
[im 1/127]
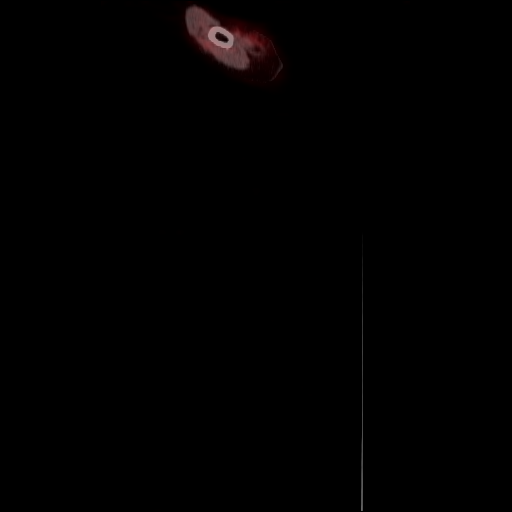
[im 127/127]
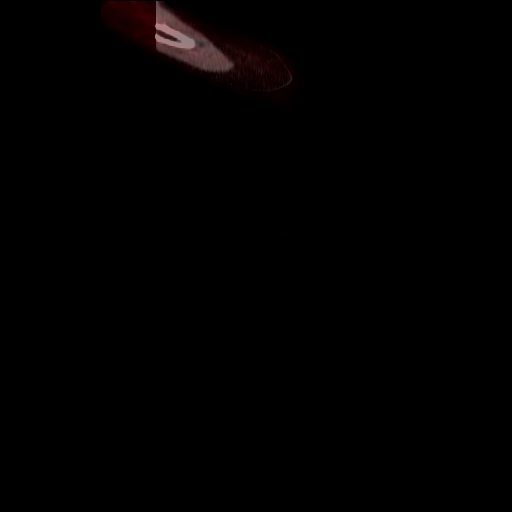

[Series 606: pet axial · 3 of 250 slices shown]
[im 1/250]
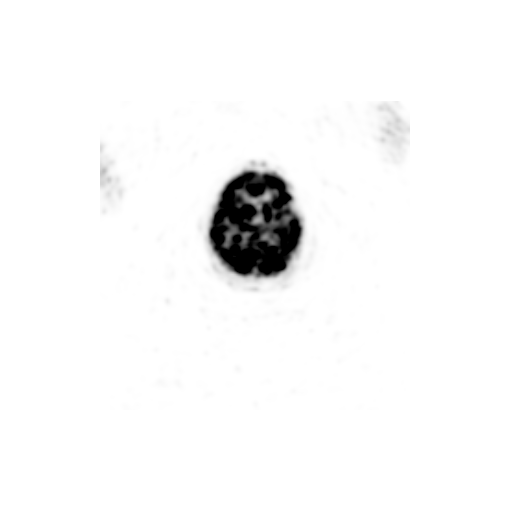
[im 84/250]
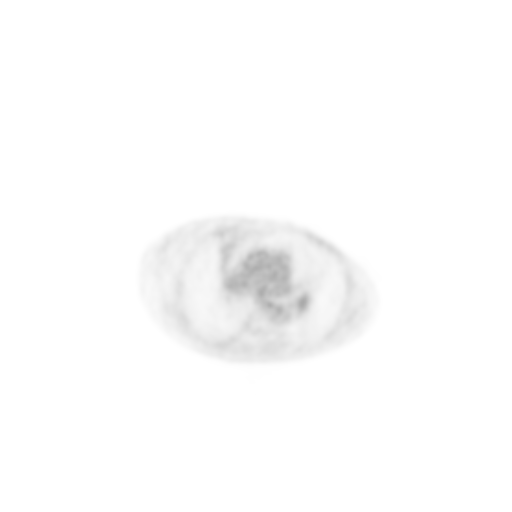
[im 250/250]
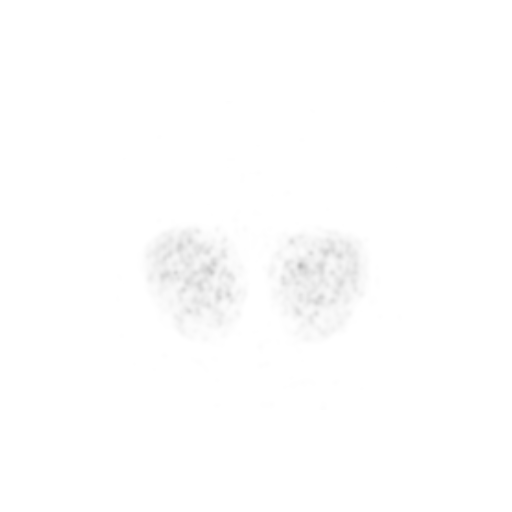

[Series 607: pet coronal · 2 of 103 slices shown]
[im 1/103]
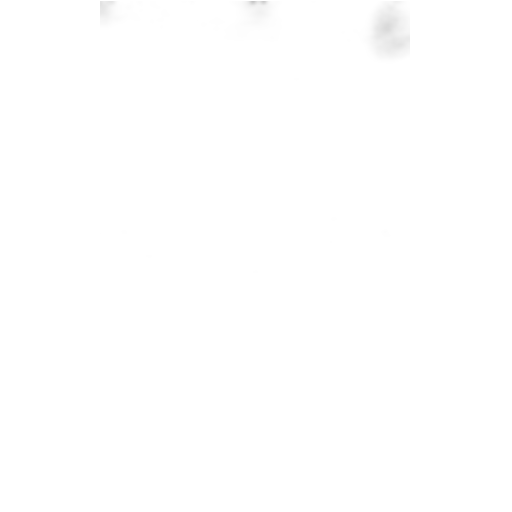
[im 103/103]
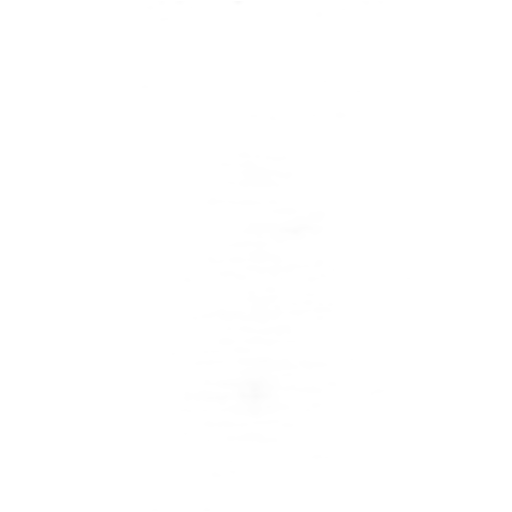

[Series 608: pet sagittal · 2 of 132 slices shown]
[im 1/132]
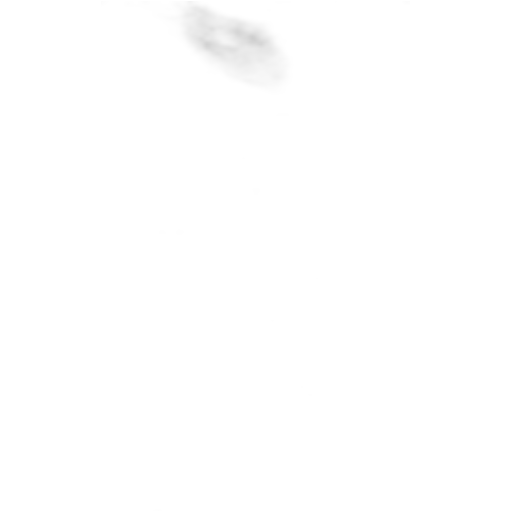
[im 132/132]
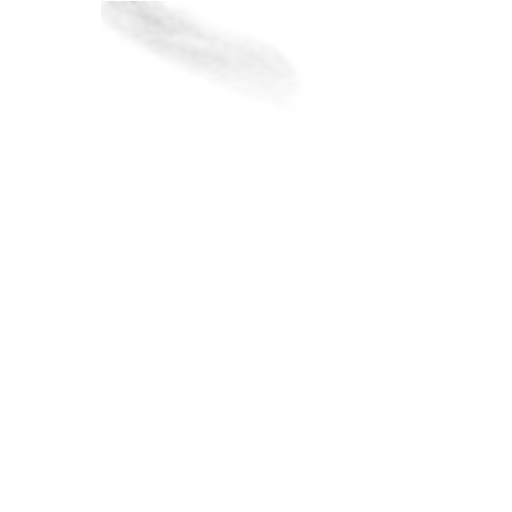

[results mm oncology reading · 3.0mm · 0.89mm/px · 1 of 2 slices shown]
[im 1/2]
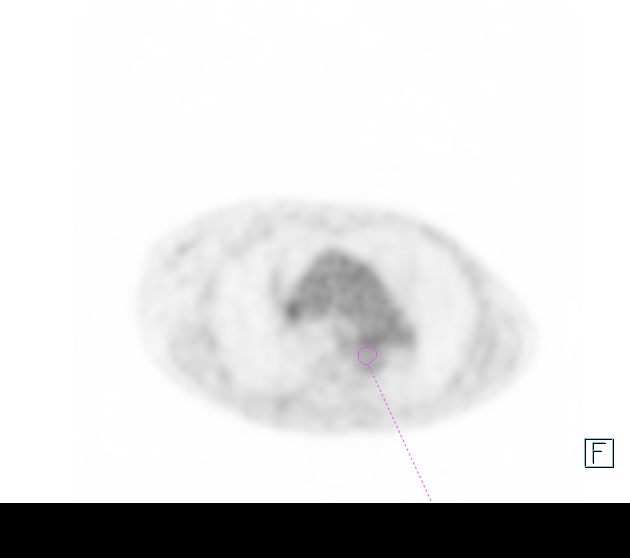

[23 of 25 positions shown; findings below may reference images not displayed]

FINDINGS: Mediastinal blood pool activity: SUV max

Liver activity: SUV max

NECK: No hypermetabolic lymph nodes in the neck.

Incidental CT findings: none

CHEST: Band of perihilar consolidation along the RIGHT fissure not
changed from comparison exam with low level metabolic activity.

No hypermetabolic activity through the esophagus. No hypermetabolic
mediastinal lymph nodes.

Incidental CT findings: No suspicious pulmonary nodules. Small RIGHT
effusion.

ABDOMEN/PELVIS: No abnormal activity through the esophagus. No
abnormal activity in stomach. No evidence of metabolic lesions in
liver. No hypermetabolic gastrohepatic ligament lymph nodes. No
retroperitoneal periportal lymphadenopathy.

Incidental CT findings: Atherosclerotic calcification of the aorta.
Small low-density lesion in the LEFT hepatic lobe does not have
metabolic activity.

LEFT lower quadrant ostomy.  RIGHT lower quadrant ostomy.

SKELETON: No aggressive osseous lesion

Incidental CT findings: none
IMPRESSION: 1. No evidence of local esophageal carcinoma recurrence.
2. No evidence of metastatic adenopathy.
3. No evidence of visceral metastasis or skeletal metastasis.
4. Band of consolidation in the RIGHT lung along the fissure not
changed from prior with low metabolic activity consistent with
inflammation.
5. Small RIGHT effusion.
6. Coronary artery calcification and Aortic Atherosclerosis
([id number]).

## 2021-05-03 ENCOUNTER — Inpatient Hospital Stay (HOSPITAL_BASED_OUTPATIENT_CLINIC_OR_DEPARTMENT_OTHER): Payer: Medicare Other | Admitting: Internal Medicine

## 2021-05-03 ENCOUNTER — Other Ambulatory Visit: Payer: Self-pay

## 2021-05-03 ENCOUNTER — Inpatient Hospital Stay: Payer: Medicare Other | Attending: Internal Medicine

## 2021-05-03 ENCOUNTER — Inpatient Hospital Stay: Payer: Medicare Other

## 2021-05-03 ENCOUNTER — Encounter: Payer: Self-pay | Admitting: Internal Medicine

## 2021-05-03 VITALS — BP 156/92 | HR 66 | Temp 98.2°F | Resp 20 | Ht 65.0 in | Wt 123.0 lb

## 2021-05-03 DIAGNOSIS — N184 Chronic kidney disease, stage 4 (severe): Secondary | ICD-10-CM | POA: Insufficient documentation

## 2021-05-03 DIAGNOSIS — C155 Malignant neoplasm of lower third of esophagus: Secondary | ICD-10-CM | POA: Diagnosis not present

## 2021-05-03 DIAGNOSIS — Z853 Personal history of malignant neoplasm of breast: Secondary | ICD-10-CM | POA: Diagnosis not present

## 2021-05-03 DIAGNOSIS — I129 Hypertensive chronic kidney disease with stage 1 through stage 4 chronic kidney disease, or unspecified chronic kidney disease: Secondary | ICD-10-CM | POA: Diagnosis present

## 2021-05-03 DIAGNOSIS — D472 Monoclonal gammopathy: Secondary | ICD-10-CM | POA: Diagnosis not present

## 2021-05-03 DIAGNOSIS — Z803 Family history of malignant neoplasm of breast: Secondary | ICD-10-CM | POA: Insufficient documentation

## 2021-05-03 DIAGNOSIS — Z923 Personal history of irradiation: Secondary | ICD-10-CM | POA: Insufficient documentation

## 2021-05-03 DIAGNOSIS — Z79899 Other long term (current) drug therapy: Secondary | ICD-10-CM | POA: Insufficient documentation

## 2021-05-03 DIAGNOSIS — Z87891 Personal history of nicotine dependence: Secondary | ICD-10-CM | POA: Insufficient documentation

## 2021-05-03 DIAGNOSIS — D631 Anemia in chronic kidney disease: Secondary | ICD-10-CM | POA: Diagnosis not present

## 2021-05-03 LAB — BASIC METABOLIC PANEL
Anion gap: 9 (ref 5–15)
BUN: 27 mg/dL — ABNORMAL HIGH (ref 8–23)
CO2: 21 mmol/L — ABNORMAL LOW (ref 22–32)
Calcium: 9.3 mg/dL (ref 8.9–10.3)
Chloride: 111 mmol/L (ref 98–111)
Creatinine, Ser: 1.71 mg/dL — ABNORMAL HIGH (ref 0.44–1.00)
GFR, Estimated: 32 mL/min — ABNORMAL LOW (ref >60)
Glucose, Bld: 99 mg/dL (ref 70–99)
Potassium: 4.5 mmol/L (ref 3.5–5.1)
Sodium: 141 mmol/L (ref 135–145)

## 2021-05-03 LAB — CBC WITH DIFFERENTIAL/PLATELET
Abs Immature Granulocytes: 0.03 10*3/uL (ref 0.00–0.07)
Basophils Absolute: 0.1 10*3/uL (ref 0.0–0.1)
Basophils Relative: 1 %
Eosinophils Absolute: 0.2 10*3/uL (ref 0.0–0.5)
Eosinophils Relative: 2 %
HCT: 30.4 % — ABNORMAL LOW (ref 36.0–46.0)
Hemoglobin: 10 g/dL — ABNORMAL LOW (ref 12.0–15.0)
Immature Granulocytes: 0 %
Lymphocytes Relative: 23 %
Lymphs Abs: 1.7 10*3/uL (ref 0.7–4.0)
MCH: 33.3 pg (ref 26.0–34.0)
MCHC: 32.9 g/dL (ref 30.0–36.0)
MCV: 101.3 fL — ABNORMAL HIGH (ref 80.0–100.0)
Monocytes Absolute: 0.7 10*3/uL (ref 0.1–1.0)
Monocytes Relative: 9 %
Neutro Abs: 4.7 10*3/uL (ref 1.7–7.7)
Neutrophils Relative %: 65 %
Platelets: 254 10*3/uL (ref 150–400)
RBC: 3 MIL/uL — ABNORMAL LOW (ref 3.87–5.11)
RDW: 14.1 % (ref 11.5–15.5)
WBC: 7.4 10*3/uL (ref 4.0–10.5)
nRBC: 0 % (ref 0.0–0.2)

## 2021-05-03 NOTE — Progress Notes (Signed)
[facility] OFFICE PROGRESS NOTE  Patient Care Team: Center, [facility] as PCP - General (General Practice) Delma Freeze, FNP as Nurse Practitioner (Family Medicine) Olin Pia, NP as Nurse Practitioner (Nurse Practitioner) Jim Like, RN as Registered Nurse Scarlett Presto, RN as Registered Nurse Benita Gutter, RN as Oncology Nurse Navigator Earna Coder, MD as Consulting Physician (Hematology and Oncology)  Cancer Staging Breast cancer in female [facility]) Staging form: Breast, AJCC 7th Edition - Clinical: Stage IIIA (T3, N1, M0) - Signed by Loann Quill, NP on 02/21/2016 Estrogen receptor status: Positive Progesterone receptor status: Positive    Oncology History Overview Note  # 2016- JAN RUL 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 A. LUNG MASS, RIGHT; CT-GUIDED BIOPSY: - ADENOCARCINOMA, ACINAR AND PAPILLARY MORPHOLOGIES. S/p RT [finished Dec 2017] s/p RT [DEC 2017]; AUG 2019- PET-right middle lobe radiation changes no evidence of recurrence.  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  T1b- submucosal involvement; N0- stage I [[name]; [facility]]; unable to proceed with Endo mucosal resection-because of extensive disease [Dr.Branch; [facility]]; recommend definitive radiation [finished  RT- 09/21/2019]   #September 2020 worsening macrocytic anemia--bone marrow biopsy shows plasma cells up to 5%; unlikely to explain patient's anemia.  MDS is still a possibility-if anemia worsens would recommend peripheral blood NGS.  Jan 2021 -anemia likely secondary to worsening CKD-Retacrit  # 2000-  Left breast cancer [T3N1- stage  III] mastecomy s/p RT; s/p chemo; Tamoxifen  # NOV 111 2017- Mol testing [RML- adeno ca]  # 2019-July-aug 2019-ischemic colitis status post right hemicolectomy [Dr.[name]]; acute renal failure;  -----------------------------------------------------------------    DIAGNOSIS: RML Lung ca adeno [stage I] #squamous cell carcinoma esophagus -stage I   CURRENT/MOST RECENT THERAPY: Status post radiation       Breast cancer in female [facility])  07/24/1999 Initial Diagnosis   Breast cancer in female (HCC) T3 N1 M0 tumor ER/PR positive    08/24/1999 -  Anti-estrogen oral therapy   Started tamoxifen    08/24/1999 Surgery   Status post modified radical mastectomy of left breast     Chemotherapy       Radiation Therapy      11/24/2003 - 02/20/2009 Anti-estrogen oral therapy   Started Aromasin    Carcinoma of overlapping sites of left breast in female, estrogen receptor positive (HCC)  06/22/2016 Initial Diagnosis   Cancer of overlapping sites of left female breast (HCC)    Malignant neoplasm of upper lobe of right lung (HCC)  Malignant neoplasm of lower third of esophagus (HCC)  05/01/2019 Initial Diagnosis   Malignant neoplasm of lower third of esophagus (HCC)     INTERVAL HISTORY:  Danielle [name] 65 y.o.  female pleasant patient above history of multiple malignancies including most recent stage I squamous cell cancer of esophagus currently status post radiation; and history of severe anemia-CKD stage IV/MGUS is here for follow-up.  Patient denies any recent admission to hospital.  Denies any diarrhea.  No nausea or vomiting.  Appetite is good.  No difficulty swallowing.  She is not losing any more weight.  Chronic mild shortness of breath mild chronic cough not any worse.   Review of Systems  Constitutional:  Positive for malaise/fatigue and weight loss. Negative for chills, diaphoresis and fever.  HENT:  Negative for nosebleeds and sore throat.   Eyes:  Negative for double  vision.  Respiratory:  Positive for shortness of breath. Negative for hemoptysis, sputum production and wheezing.   Cardiovascular:  Negative for chest pain, palpitations, orthopnea and leg swelling.  Gastrointestinal:  Negative for abdominal pain, blood in stool, constipation, heartburn, melena and vomiting.  Genitourinary:  Negative for dysuria, frequency and urgency.  Musculoskeletal:  Negative for back pain and joint pain.  Skin: Negative.  Negative for itching and rash.  Neurological:  Negative for tingling, focal weakness, weakness and headaches.  Endo/Heme/Allergies:  Does not bruise/bleed easily.  Psychiatric/Behavioral:  Negative for depression. The patient is not nervous/anxious and does not have insomnia.      PAST MEDICAL HISTORY :  Past Medical History:  Diagnosis Date   Anxiety    Arthritis    Breast cancer ([place]) 2000   Cancer ([place]) left    breast cancer 2000, chemo tx's with total mastectomy and lymph nodes resected.    Cancer of right lung ([place]) 02/21/2016   rad tx's.    CHF (congestive heart failure) (HCC)    COPD (chronic obstructive pulmonary disease) (HCC)    Dependence on supplemental oxygen    Depression    Heart murmur    Hypertension    Lung nodule    Lymphedema    Personal history of chemotherapy    Personal history of radiation therapy    Shortness of breath dyspnea    with exertion   Status post chemotherapy 2001   left breast cancer   Status post radiation therapy 2001   left breast cancer    PAST SURGICAL HISTORY :   Past Surgical History:  Procedure Laterality Date   Breast Biospy Left    [facility]   BREAST SURGERY     COLONOSCOPY N/A 04/30/2018   Procedure: COLONOSCOPY;  Surgeon: Virgel Manifold, MD;  Location: [facility] ENDOSCOPY;  Service: Endoscopy;  Laterality: N/A;   COLONOSCOPY N/A 07/22/2018   Procedure: COLONOSCOPY;  Surgeon: Virgel Manifold, MD;  Location: [facility] ENDOSCOPY;  Service: Endoscopy;  Laterality: N/A;   DILATION AND  CURETTAGE OF UTERUS     ELECTROMAGNETIC NAVIGATION BROCHOSCOPY Right 04/11/2016   Procedure: ELECTROMAGNETIC NAVIGATION BRONCHOSCOPY;  Surgeon: Vilinda Boehringer, MD;  Location: [facility] ORS;  Service: Cardiopulmonary;  Laterality: Right;   ESOPHAGOGASTRODUODENOSCOPY N/A 07/22/2018   Procedure: ESOPHAGOGASTRODUODENOSCOPY (EGD);  Surgeon: Virgel Manifold, MD;  Location: [facility] ENDOSCOPY;  Service: Endoscopy;  Laterality: N/A;   ESOPHAGOGASTRODUODENOSCOPY (EGD) WITH PROPOFOL N/A 05/07/2018   Procedure: ESOPHAGOGASTRODUODENOSCOPY (EGD) WITH PROPOFOL;  Surgeon: Lucilla Lame, MD;  Location: [facility] ENDOSCOPY;  Service: Endoscopy;  Laterality: N/A;   ESOPHAGOGASTRODUODENOSCOPY (EGD) WITH PROPOFOL N/A 04/24/2019   Procedure: ESOPHAGOGASTRODUODENOSCOPY (EGD) WITH PROPOFOL;  Surgeon: Jonathon Bellows, MD;  Location[facility] ENDOSCOPY;  Service: Gastroenterology;  Laterality: N/A;   ESOPHAGOGASTRODUODENOSCOPY (EGD) WITH PROPOFOL N/A 01/12/2020   Procedure: ESOPHAGOGASTRODUODENOSCOPY (EGD) WITH PROPOFOL;  Surgeon: Jonathon Bellows, MD;  Location: [facility] ENDOSCOPY;  Service: Gastroenterology;  Laterality: N/A;   ESOPHAGOGASTRODUODENOSCOPY (EGD) WITH PROPOFOL N/A 04/28/2020   Procedure: ESOPHAGOGASTRODUODENOSCOPY (EGD) WITH PROPOFOL;  Surgeon: Jonathon Bellows, MD;  Location: [facility] ENDOSCOPY;  Service: Gastroenterology;  Laterality: N/A;   EUS N/A 05/07/2019   Procedure: FULL UPPER ENDOSCOPIC ULTRASOUND (EUS) RADIAL;  Surgeon: Jola Schmidt, MD;  Location: [facility] ENDOSCOPY;  Service: Endoscopy;  Laterality: N/A;   history of colonoscopy]     ILEOSCOPY  N/A 07/22/2018   Procedure: ILEOSCOPY THROUGH STOMA;  Surgeon: Virgel Manifold, MD;  Location: [facility] ENDOSCOPY;  Service: Endoscopy;  Laterality: N/A;   ILEOSTOMY     ILEOSTOMY N/A 09/08/2018   Procedure: ILEOSTOMY REVISION POSSIBLE CREATION;  Surgeon: Herbert Pun, MD;  Location: [facility] ORS;  Service: General;  Laterality: N/A;   ILEOSTOMY CLOSURE N/A 08/15/2018   Procedure: DILATION OF  ILEOSTOMY STRICTURE;  Surgeon: Herbert Pun, MD;  Location: [facility] ORS;  Service: General;  Laterality: N/A;   LAPAROTOMY Right 05/04/2018   Procedure: EXPLORATORY LAPAROTOMY right colectomy right and left ostomy;  Surgeon: Herbert Pun, MD;  Location: [facility] ORS;  Service: General;  Laterality: Right;   LUNG BIOPSY     MASTECTOMY Left    2000, [facility]   ROTATOR CUFF REPAIR Right    [facility]    FAMILY HISTORY :   Family History  Problem Relation Age of Onset   Breast cancer Mother 1   Cancer Mother        Breast    Cirrhosis Father    Breast cancer Paternal Aunt 71   Cancer Maternal Aunt        Breast     SOCIAL HISTORY:   Social History   Tobacco Use   Smoking status: Former    Packs/day: 0.50    Years: 20.00    Pack years: 10.00    Types: Cigarettes    Quit date: 07/02/2012    Years since quitting: 8.8   Smokeless tobacco: Current    Types: Snuff   Tobacco comments:    quit 2014  Vaping Use   Vaping Use: Never used  Substance Use Topics   Alcohol use: Not Currently    Comment: Occasionally   Drug use: No    ALLERGIES:  has No Known Allergies.  MEDICATIONS:  Current Outpatient Medications  Medication Sig Dispense Refill   acetaminophen (TYLENOL) 325 MG tablet Take 2 tablets (650 mg total) by mouth every 6 (six) hours as needed for mild pain (or Fever >/= 101).     albuterol (PROVENTIL HFA;VENTOLIN HFA) 108 (90 Base) MCG/ACT inhaler Inhale 2 puffs into the lungs every 6 (six) hours as needed for wheezing or shortness of breath. 1 Inhaler 2   amLODipine (NORVASC) 5 MG tablet amlodipine 5 mg tablet  TAKE ONE TABLET BY MOUTH EVERY DAY FOR BLOOD PRESSURE     apixaban (ELIQUIS) 2.5 MG TABS tablet Eliquis 2.5 mg tablet     calcium carbonate (OSCAL) 1500 (600 Ca) MG TABS tablet Take 1 tablet by mouth daily.     carvedilol (COREG) 12.5 MG tablet carvedilol 12.5 mg tablet     cholecalciferol (VITAMIN D3) 25 MCG (1000 UNIT) tablet Take 1,000 Units by mouth daily.      citalopram (CELEXA) 40 MG tablet Take 40 mg by mouth daily.      ferrous sulfate 325 (65 FE) MG tablet Take 1 tablet (325 mg total) by mouth 2 (two) times daily with a meal. 60 tablet 3   Fluticasone-Umeclidin-Vilant 100-62.5-25 MCG/INH AEPB Inhale 1 puff into the lungs daily.      loperamide (IMODIUM) 2 MG capsule Take 2 mg by mouth as needed for diarrhea or loose stools.     losartan-hydrochlorothiazide (HYZAAR) 100-25 MG tablet losartan 100 mg-hydrochlorothiazide 25 mg tablet     Multiple Vitamin (MULTIVITAMIN WITH MINERALS) TABS tablet Take 1 tablet by mouth daily. 30 tablet 1   Nutritional Supplements (FEEDING SUPPLEMENT, NEPRO CARB STEADY,) LIQD Take 237 mLs by  mouth 3 (three) times daily between meals. 21330 mL 0   pantoprazole (PROTONIX) 40 MG tablet Take 1 tablet (40 mg total) by mouth 2 (two) times daily before a meal. 60 tablet 2   predniSONE (DELTASONE) 10 MG tablet Take 1 tablet (10 mg total) by mouth daily with breakfast. 30 tablet 0   sodium bicarbonate 650 MG tablet Take 2 tablets (1,300 mg total) by mouth 3 (three) times daily. 180 tablet 0   sucralfate (CARAFATE) 1 GM/10ML suspension Take 10 mLs (1 g total) by mouth 4 (four) times daily. 1200 mL 2   No current facility-administered medications for this visit.    PHYSICAL EXAMINATION: ECOG PERFORMANCE STATUS: 1 - Symptomatic but completely ambulatory  BP (!) 156/92   Pulse 66   Temp 98.2 F (36.8 C) (Oral)   Resp 20   Ht $R'5\' 5"'ld$  (1.651 m)   Wt 123 lb (55.8 kg)   BMI 20.47 kg/m   Filed Weights   05/03/21 1023  Weight: 123 lb (55.8 kg)    Physical Exam Constitutional:      Comments: Thin built moderately nourished female patient.  She is walking herself.  She is alone.  HENT:     Head: Normocephalic and atraumatic.     Mouth/Throat:     Pharynx: No oropharyngeal exudate.  Eyes:     Pupils: Pupils are equal, round, and reactive to light.  Cardiovascular:     Rate and Rhythm: Normal rate and regular rhythm.   Pulmonary:     Effort: No respiratory distress.     Breath sounds: No wheezing.     Comments: Decreased breath sounds bilaterally.  Abdominal:     General: Bowel sounds are normal. There is no distension.     Palpations: Abdomen is soft. There is no mass.     Tenderness: There is no abdominal tenderness. There is no guarding or rebound.  Musculoskeletal:        General: No tenderness. Normal range of motion.     Cervical back: Normal range of motion and neck supple.     Comments: Left upper extremity-swollen/no warmth or tenderness.   Skin:    General: Skin is warm.  Neurological:     Mental Status: She is alert and oriented to person, place, and time.  Psychiatric:        Mood and Affect: Affect normal.    LABORATORY DATA:  I have reviewed the data as listed    Component Value Date/Time   NA 141 05/03/2021 0924   NA 132 (L) 02/09/2015 1100   K 4.5 05/03/2021 0924   K 3.8 02/09/2015 1100   CL 111 05/03/2021 0924   CL 95 (L) 02/09/2015 1100   CO2 21 (L) 05/03/2021 0924   CO2 29 02/09/2015 1100   GLUCOSE 99 05/03/2021 0924   GLUCOSE 105 (H) 02/09/2015 1100   BUN 27 (H) 05/03/2021 0924   BUN 16 02/09/2015 1100   CREATININE 1.71 (H) 05/03/2021 0924   CREATININE 0.81 02/09/2015 1100   CALCIUM 9.3 05/03/2021 0924   CALCIUM 9.1 02/09/2015 1100   PROT 7.5 11/17/2020 0038   PROT 7.7 02/09/2015 1100   ALBUMIN 3.9 11/17/2020 0038   ALBUMIN 4.3 02/09/2015 1100   AST 22 11/17/2020 0038   AST 29 02/09/2015 1100   ALT 12 11/17/2020 0038   ALT 20 02/09/2015 1100   ALKPHOS 54 11/17/2020 0038   ALKPHOS 69 02/09/2015 1100   BILITOT 0.6 11/17/2020 0038   BILITOT 0.9 02/09/2015  1100   GFRNONAA 32 (L) 05/03/2021 0924   GFRNONAA >60 02/09/2015 1100   GFRAA 37 (L) 06/29/2020 0930   GFRAA >60 02/09/2015 1100    No results found for: SPEP, UPEP  Lab Results  Component Value Date   WBC 7.4 05/03/2021   NEUTROABS 4.7 05/03/2021   HGB 10.0 (L) 05/03/2021   HCT 30.4 (L)  05/03/2021   MCV 101.3 (H) 05/03/2021   PLT 254 05/03/2021      Chemistry      Component Value Date/Time   NA 141 05/03/2021 0924   NA 132 (L) 02/09/2015 1100   K 4.5 05/03/2021 0924   K 3.8 02/09/2015 1100   CL 111 05/03/2021 0924   CL 95 (L) 02/09/2015 1100   CO2 21 (L) 05/03/2021 0924   CO2 29 02/09/2015 1100   BUN 27 (H) 05/03/2021 0924   BUN 16 02/09/2015 1100   CREATININE 1.71 (H) 05/03/2021 0924   CREATININE 0.81 02/09/2015 1100      Component Value Date/Time   CALCIUM 9.3 05/03/2021 0924   CALCIUM 9.1 02/09/2015 1100   ALKPHOS 54 11/17/2020 0038   ALKPHOS 69 02/09/2015 1100   AST 22 11/17/2020 0038   AST 29 02/09/2015 1100   ALT 12 11/17/2020 0038   ALT 20 02/09/2015 1100   BILITOT 0.6 11/17/2020 0038   BILITOT 0.9 02/09/2015 1100       RADIOGRAPHIC STUDIES: I have personally reviewed the radiological images as listed and agreed with the findings in the report. No results found.   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  Currently s/p definitive radiation [dec 7th 2020].  Posttreatment PET scan February 2021-no evidence of any metastatic disease.  No uptake in the esophagus region. EGD April 28 2020 th- NO evidence of recurrence. STABLE  Continue surveillance.   #Severe anemia-likely secondary to CKD stage IV-currently on Retacrit; hemoglobin today is 10-  HOLD  Retacrit today. STABLE.   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]-M protein 0.7 g/dL kappa lambda light chain normal 5% plasma cells/renal failure [no hypercalcemia or bone lesions on recent February 2021 PET scan].; STABLE;    # DISPOSITION:  #  HOLD RETACRIT today # in 4 weeks- H&H- possible retacrit # in 8 weeks- H&H- possible retacrit # follow up in 12 weeks- cbc/bmp/ possible Retacrit;MM pane;' K/L light chain ratio--Dr.B  Orders Placed This Encounter   Procedures   Hemoglobin and Hematocrit, Blood    Standing Status:   Standing    Number of Occurrences:   2    Standing Expiration Date:   05/03/2022   CBC with Differential    Standing Status:   Future    Standing Expiration Date:   05/03/2022   Comprehensive metabolic panel    Standing Status:   Future    Standing Expiration Date:   05/03/2022   Multiple Myeloma Panel (SPEP&IFE w/QIG)    Standing Status:   Future    Standing Expiration Date:   05/03/2022   Kappa/lambda light chains    Standing Status:   Future    Standing Expiration Date:   05/03/2022   All questions were answered. The patient knows to call the clinic with any problems, questions or concerns.      [name], MD 05/03/2021 3:07 PM

## 2021-05-03 NOTE — Assessment & Plan Note (Signed)
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  Currently s/p definitive radiation [dec 7th 2020].  Posttreatment PET scan February 2021-no evidence of any metastatic disease.  No uptake in the esophagus region. EGD April 28 2020 th- NO evidence of recurrence. STABLE  Continue surveillance.   #Severe anemia-likely secondary to CKD stage IV-currently on Retacrit; hemoglobin today is 10-  HOLD  Retacrit today. STABLE.   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

## 2021-05-31 ENCOUNTER — Inpatient Hospital Stay: Payer: Medicare Other | Attending: Internal Medicine

## 2021-05-31 ENCOUNTER — Other Ambulatory Visit: Payer: Self-pay

## 2021-05-31 ENCOUNTER — Inpatient Hospital Stay: Payer: Medicare Other

## 2021-05-31 DIAGNOSIS — N184 Chronic kidney disease, stage 4 (severe): Secondary | ICD-10-CM

## 2021-05-31 DIAGNOSIS — C155 Malignant neoplasm of lower third of esophagus: Secondary | ICD-10-CM | POA: Insufficient documentation

## 2021-05-31 DIAGNOSIS — D631 Anemia in chronic kidney disease: Secondary | ICD-10-CM

## 2021-05-31 LAB — HEMOGLOBIN AND HEMATOCRIT, BLOOD
HCT: 31.4 % — ABNORMAL LOW (ref 36.0–46.0)
Hemoglobin: 10.2 g/dL — ABNORMAL LOW (ref 12.0–15.0)

## 2021-06-06 IMAGING — CT CT HEAD W/O CM
3 series · 16 of 45 positions shown, 19 images · non-contrast
Comparison: 05/15/2018

CLINICAL DATA: Central vertigo.

EXAM:
CT HEAD WITHOUT CONTRAST
TECHNIQUE: Contiguous axial images were obtained from the base of the skull
through the vertex without intravenous contrast.

[Series 3: head wo · axial · 0.41mm/px · z∈[-75,+40]mm · 10 of 28 slices shown, 13 images]
[im 3/28  brain]
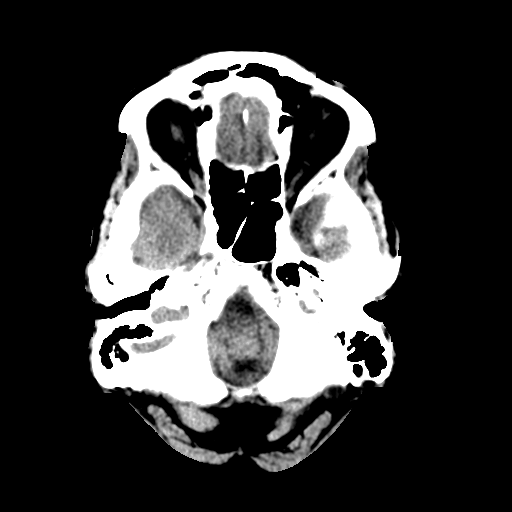
[im 3/28  bone]
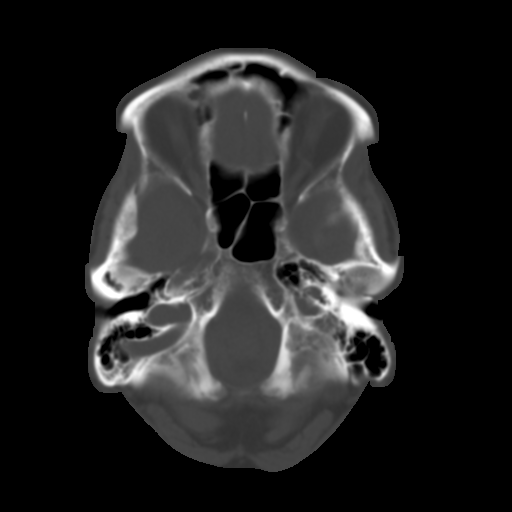
[im 5/28  brain]
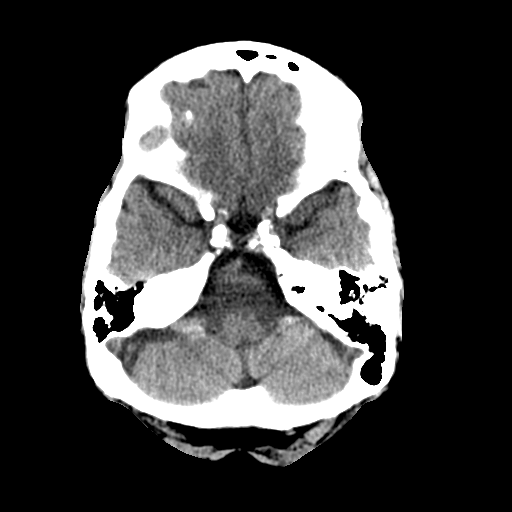
[im 8/28  brain]
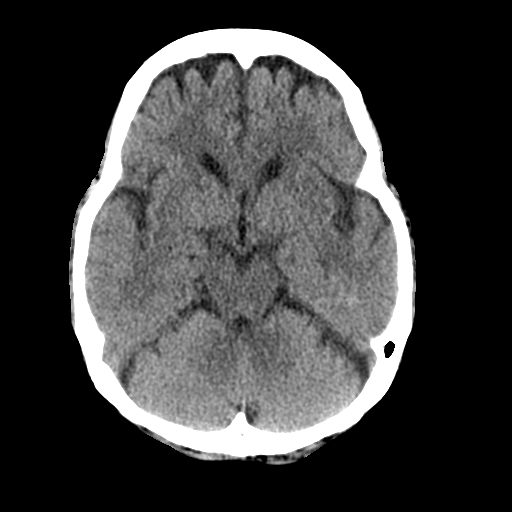
[im 11/28  brain]
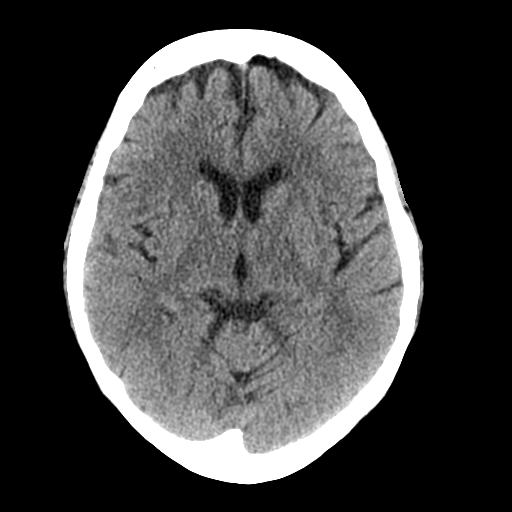
[im 13/28  brain]
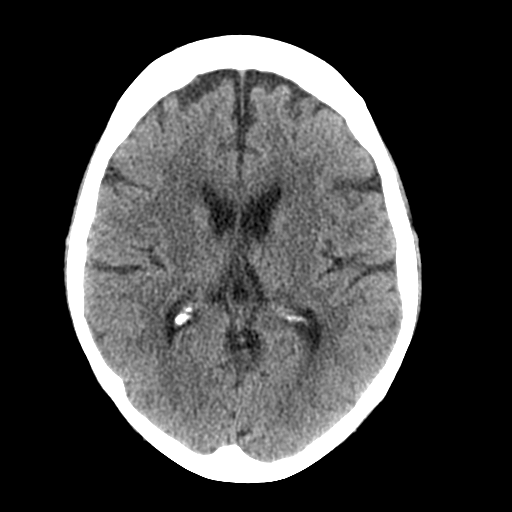
[im 13/28  bone]
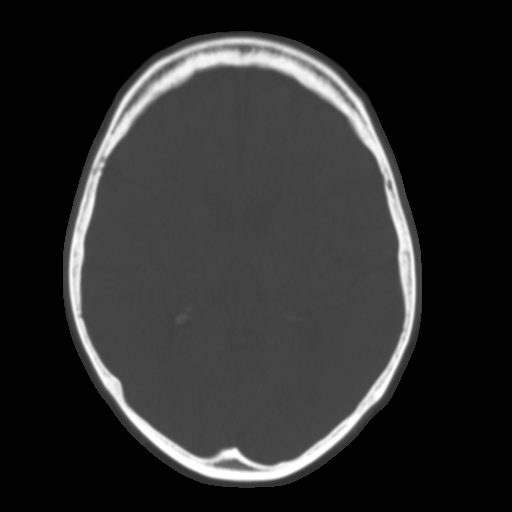
[im 16/28  brain]
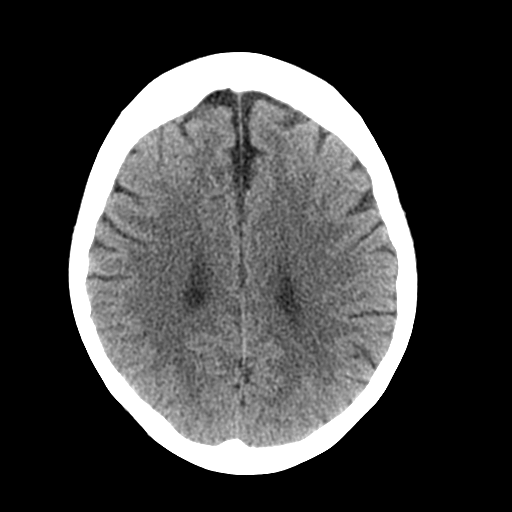
[im 18/28  brain]
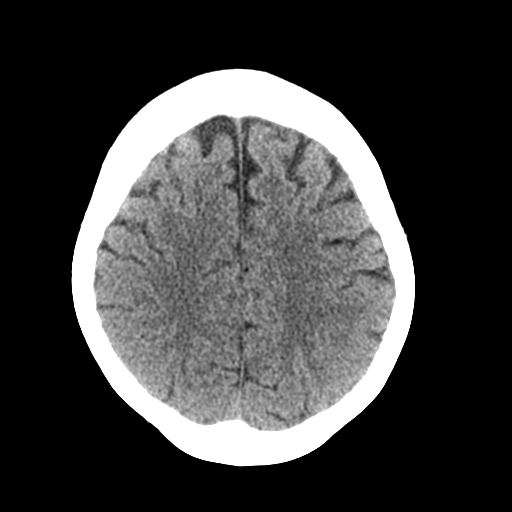
[im 21/28  brain]
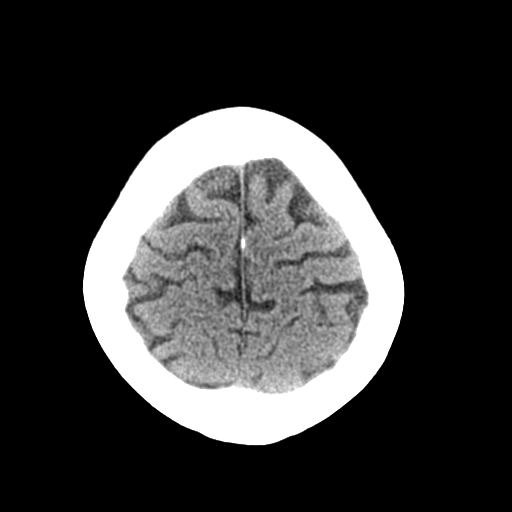
[im 24/28  brain]
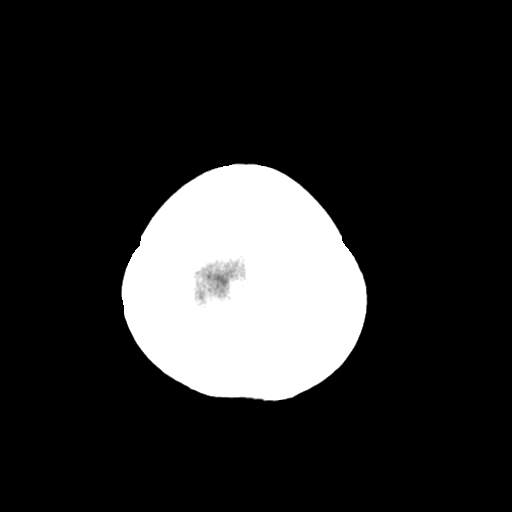
[im 24/28  bone]
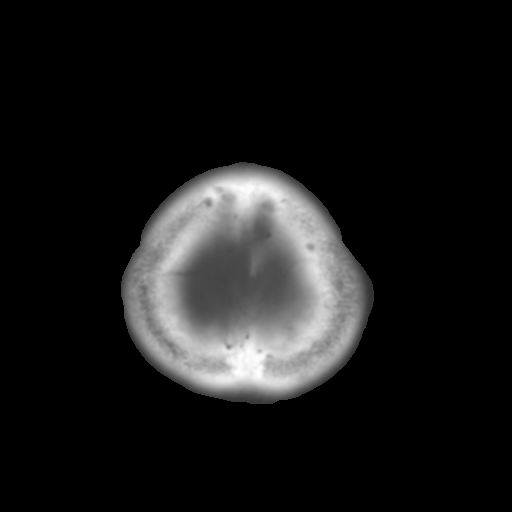
[im 26/28  brain]
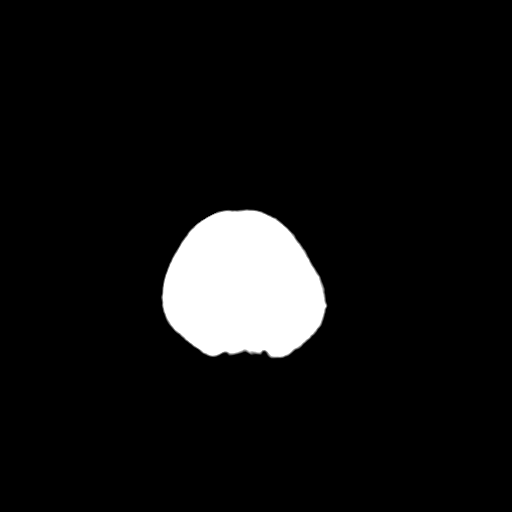

[Series 4: coronal soft tissue · coronal · 0.30mm/px · 3 of 61 slices shown]
[im 21/61  brain]
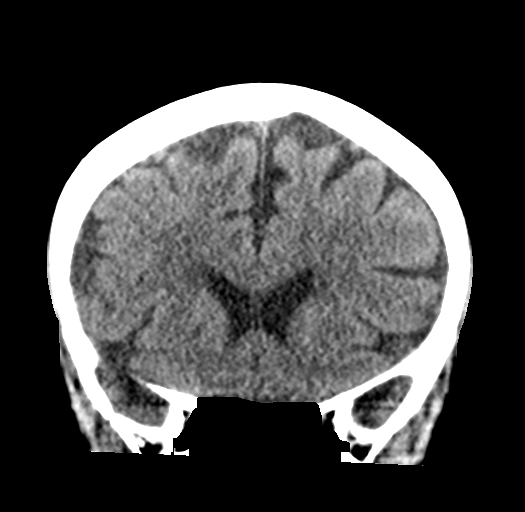
[im 27/61  brain]
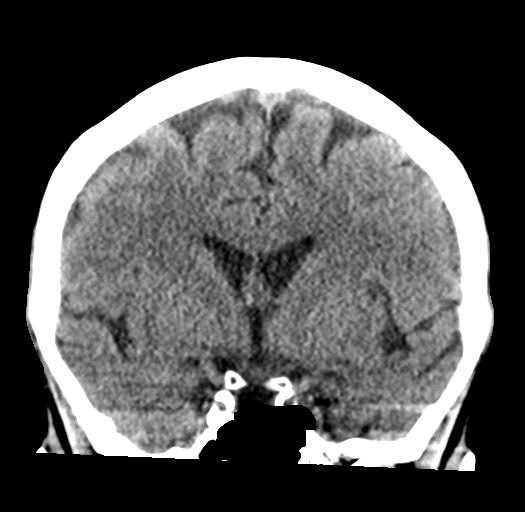
[im 34/61  brain]
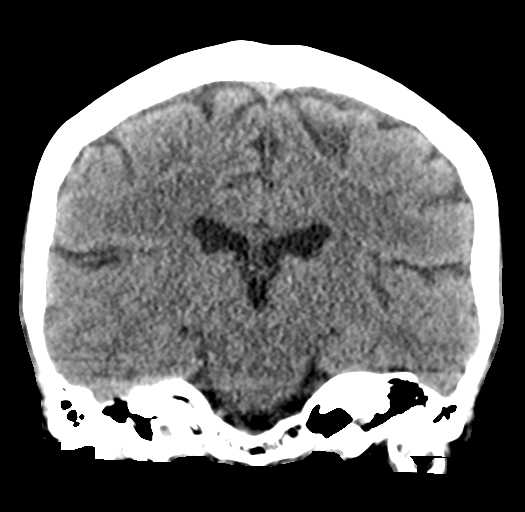

[Series 5: sagittal soft tissue · sagittal · 0.29mm/px · 3 of 49 slices shown]
[im 17/49  brain]
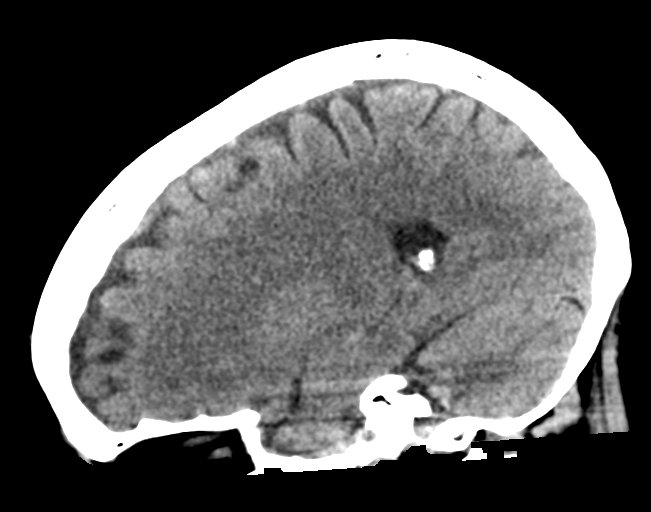
[im 25/49  brain]
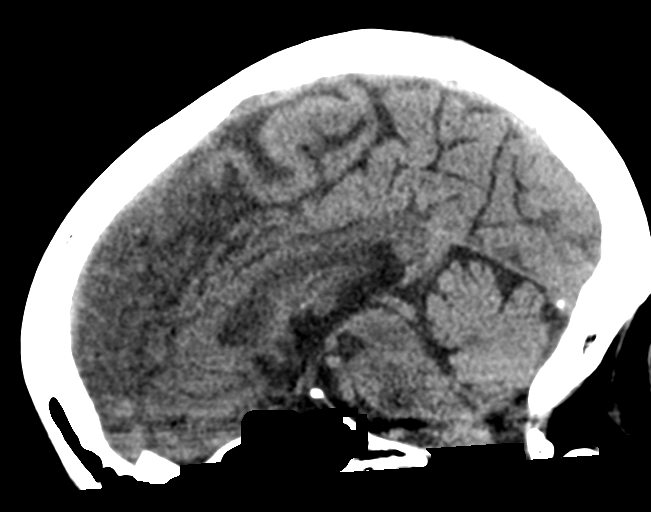
[im 33/49  brain]
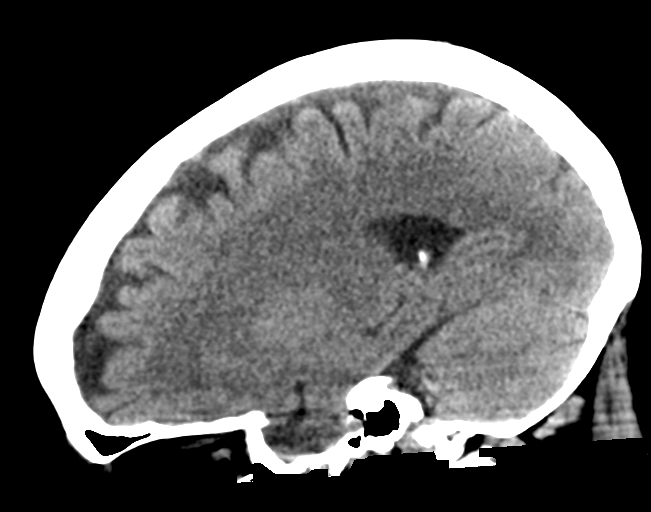

[16 of 45 positions shown; findings below may reference images not displayed]

FINDINGS: Brain: No evidence of acute infarction, hemorrhage, hydrocephalus,
extra-axial collection or mass lesion/mass effect.

Vascular: No hyperdense vessel or unexpected calcification.

Skull: Normal. Negative for fracture or focal lesion.

Sinuses/Orbits: No acute finding.
IMPRESSION: Negative head CT.

## 2021-06-06 IMAGING — DX DG CHEST 1V PORT
1 series · 1 of 1 positions shown · non-contrast
Comparison: Chest x-ray 11/06/2019

CLINICAL DATA: Shortness of breath and dizziness.

EXAM:
PORTABLE CHEST 1 VIEW

[chest ap]
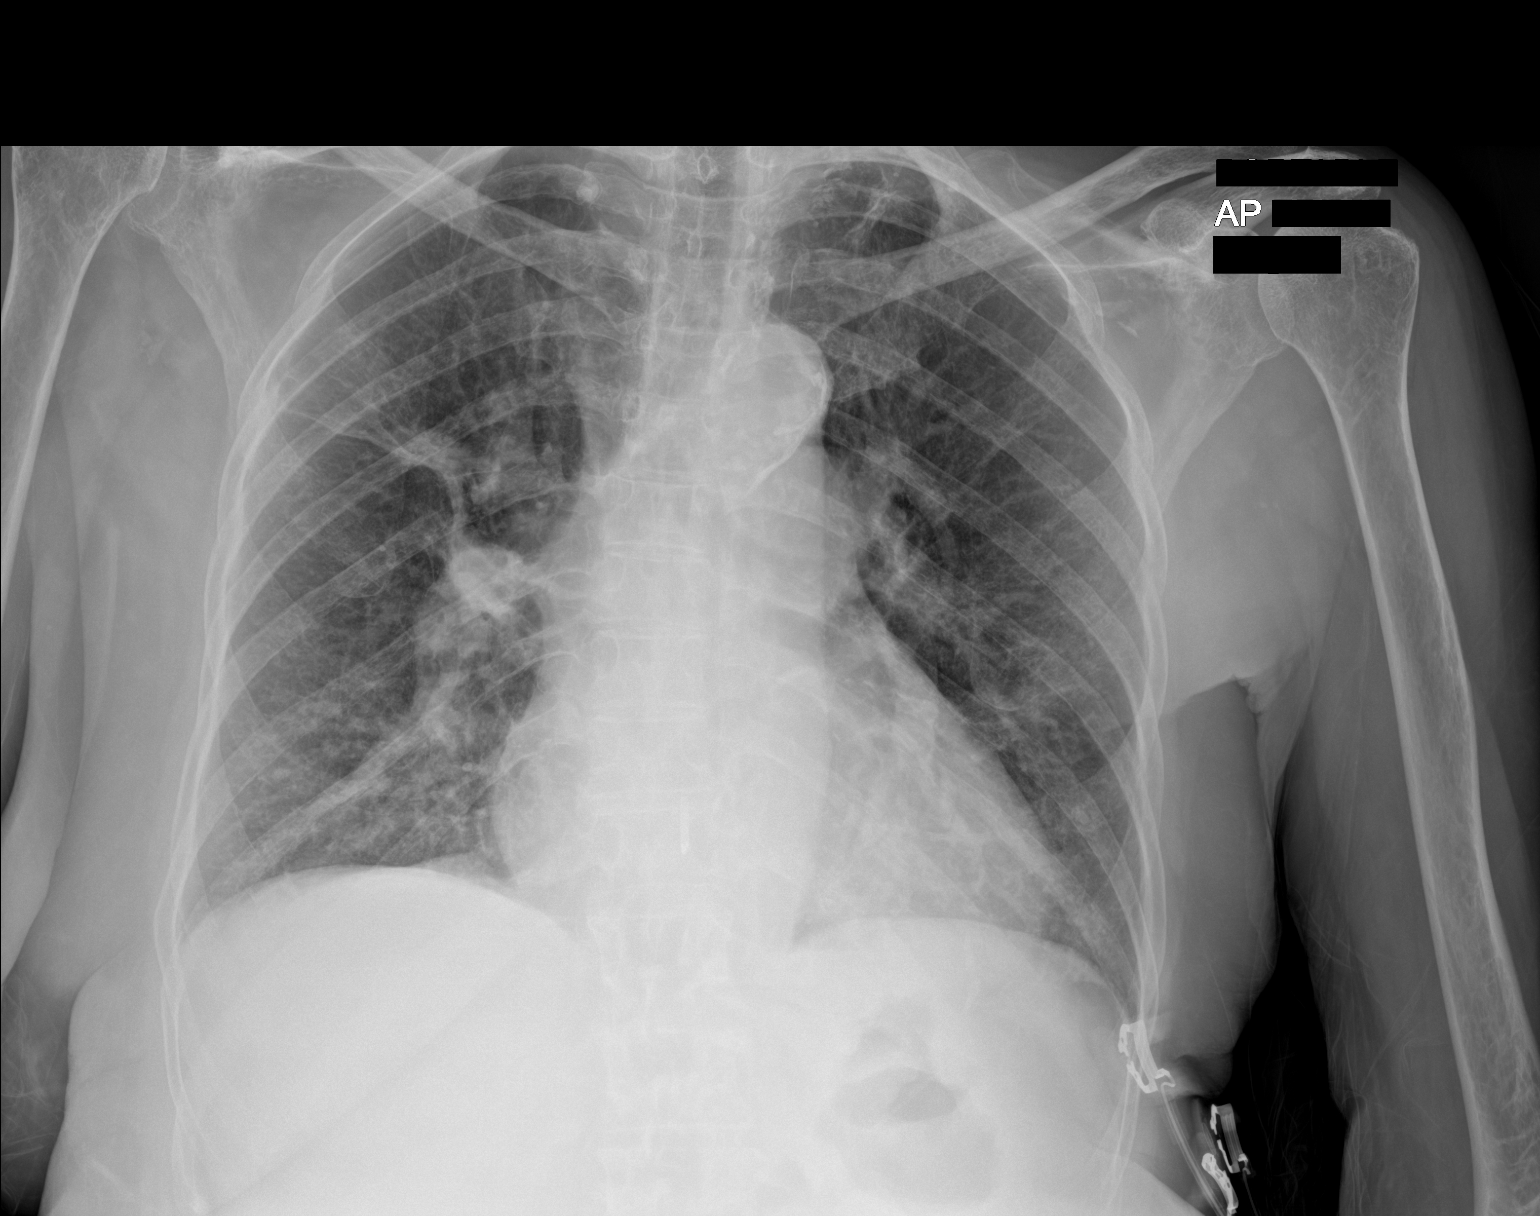

[1 of 1 positions shown; findings below may reference images not displayed]

FINDINGS: The cardiac silhouette, mediastinal and hilar contours are within
normal limits and stable. Stable tortuosity and calcification of the
thoracic aorta.

Chronic underlying emphysematous changes and pulmonary scarring.
Persistent right upper lobe density. No definite pleural effusions
or pulmonary edema. The bony thorax is intact.
IMPRESSION: 1. Chronic underlying emphysematous changes and pulmonary scarring.
2. Stable right upper lobe density.
3. No infiltrates, edema or effusions.

## 2021-06-28 ENCOUNTER — Inpatient Hospital Stay: Payer: Medicare Other

## 2021-06-28 ENCOUNTER — Inpatient Hospital Stay: Payer: Medicare Other | Attending: Internal Medicine

## 2021-06-28 DIAGNOSIS — D631 Anemia in chronic kidney disease: Secondary | ICD-10-CM

## 2021-06-28 DIAGNOSIS — Z79899 Other long term (current) drug therapy: Secondary | ICD-10-CM | POA: Diagnosis not present

## 2021-06-28 DIAGNOSIS — N189 Chronic kidney disease, unspecified: Secondary | ICD-10-CM | POA: Diagnosis not present

## 2021-06-28 DIAGNOSIS — C155 Malignant neoplasm of lower third of esophagus: Secondary | ICD-10-CM

## 2021-06-28 DIAGNOSIS — I129 Hypertensive chronic kidney disease with stage 1 through stage 4 chronic kidney disease, or unspecified chronic kidney disease: Secondary | ICD-10-CM | POA: Diagnosis not present

## 2021-06-28 LAB — HEMOGLOBIN AND HEMATOCRIT, BLOOD
HCT: 34.2 % — ABNORMAL LOW (ref 36.0–46.0)
Hemoglobin: 10.9 g/dL — ABNORMAL LOW (ref 12.0–15.0)

## 2021-07-27 ENCOUNTER — Other Ambulatory Visit: Payer: Self-pay

## 2021-07-27 ENCOUNTER — Inpatient Hospital Stay: Payer: Medicare Other | Attending: Internal Medicine

## 2021-07-27 ENCOUNTER — Inpatient Hospital Stay (HOSPITAL_BASED_OUTPATIENT_CLINIC_OR_DEPARTMENT_OTHER): Payer: Medicare Other | Admitting: Internal Medicine

## 2021-07-27 ENCOUNTER — Inpatient Hospital Stay: Payer: Medicare Other

## 2021-07-27 ENCOUNTER — Encounter: Payer: Self-pay | Admitting: Internal Medicine

## 2021-07-27 DIAGNOSIS — C155 Malignant neoplasm of lower third of esophagus: Secondary | ICD-10-CM | POA: Insufficient documentation

## 2021-07-27 DIAGNOSIS — I129 Hypertensive chronic kidney disease with stage 1 through stage 4 chronic kidney disease, or unspecified chronic kidney disease: Secondary | ICD-10-CM | POA: Insufficient documentation

## 2021-07-27 DIAGNOSIS — D472 Monoclonal gammopathy: Secondary | ICD-10-CM | POA: Insufficient documentation

## 2021-07-27 DIAGNOSIS — N184 Chronic kidney disease, stage 4 (severe): Secondary | ICD-10-CM | POA: Diagnosis not present

## 2021-07-27 DIAGNOSIS — D631 Anemia in chronic kidney disease: Secondary | ICD-10-CM

## 2021-07-27 LAB — COMPREHENSIVE METABOLIC PANEL
ALT: 13 U/L (ref 0–44)
AST: 21 U/L (ref 15–41)
Albumin: 3.8 g/dL (ref 3.5–5.0)
Alkaline Phosphatase: 76 U/L (ref 38–126)
Anion gap: 10 (ref 5–15)
BUN: 22 mg/dL (ref 8–23)
CO2: 20 mmol/L — ABNORMAL LOW (ref 22–32)
Calcium: 9.2 mg/dL (ref 8.9–10.3)
Chloride: 108 mmol/L (ref 98–111)
Creatinine, Ser: 1.79 mg/dL — ABNORMAL HIGH (ref 0.44–1.00)
GFR, Estimated: 31 mL/min — ABNORMAL LOW (ref >60)
Glucose, Bld: 82 mg/dL (ref 70–99)
Potassium: 3.7 mmol/L (ref 3.5–5.1)
Sodium: 138 mmol/L (ref 135–145)
Total Bilirubin: 0.6 mg/dL (ref 0.3–1.2)
Total Protein: 7.7 g/dL (ref 6.5–8.1)

## 2021-07-27 LAB — CBC WITH DIFFERENTIAL/PLATELET
Abs Immature Granulocytes: 0.01 10*3/uL (ref 0.00–0.07)
Basophils Absolute: 0.1 10*3/uL (ref 0.0–0.1)
Basophils Relative: 1 %
Eosinophils Absolute: 0.2 10*3/uL (ref 0.0–0.5)
Eosinophils Relative: 4 %
HCT: 31.7 % — ABNORMAL LOW (ref 36.0–46.0)
Hemoglobin: 10.3 g/dL — ABNORMAL LOW (ref 12.0–15.0)
Immature Granulocytes: 0 %
Lymphocytes Relative: 25 %
Lymphs Abs: 1.3 10*3/uL (ref 0.7–4.0)
MCH: 31.8 pg (ref 26.0–34.0)
MCHC: 32.5 g/dL (ref 30.0–36.0)
MCV: 97.8 fL (ref 80.0–100.0)
Monocytes Absolute: 0.6 10*3/uL (ref 0.1–1.0)
Monocytes Relative: 12 %
Neutro Abs: 3 10*3/uL (ref 1.7–7.7)
Neutrophils Relative %: 58 %
Platelets: 253 10*3/uL (ref 150–400)
RBC: 3.24 MIL/uL — ABNORMAL LOW (ref 3.87–5.11)
RDW: 14 % (ref 11.5–15.5)
WBC: 5.3 10*3/uL (ref 4.0–10.5)
nRBC: 0 % (ref 0.0–0.2)

## 2021-07-27 NOTE — Assessment & Plan Note (Addendum)
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  Currently s/p definitive radiation [dec 7th 2020].  Posttreatment PET scan February 2021-no evidence of any metastatic disease.  No uptake in the esophagus region. EGD April 28 2020 th- NO evidence of recurrence. STABLE  Continue surveillance. Discussed with [name] re: repeating EGD.  Reminded the patient to call Dr. Georgeann Oppenheim office to make an appointment ASAP.  #Severe anemia-likely secondary to CKD stage IV-currently on Retacrit; hemoglobin today is 10.3-  HOLD  Retacrit today. STABLE.   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 Awaiting labs form today.    # DISPOSITION:  #  HOLD RETACRIT today # in 2 months- H&H- possible retacrit # follow up in 4 months-- cbc/bmp/ possible Retacrit;MM pane;' K/L light chain ratio--Dr.B

## 2021-07-27 NOTE — Patient Instructions (Signed)
#  Please call Dr. Derwood Kaplan stomach doctor's office- regarding repeating upper endoscopy for your esophagus cancer.

## 2021-07-27 NOTE — Progress Notes (Signed)
[facility] OFFICE PROGRESS NOTE  Patient Care Team: [facility], [place] as PCP - General ([place]) [name], FNP as Nurse Practitioner (Family Medicine) [name], NP as Nurse Practitioner (Nurse Practitioner) [name], RN as Registered Nurse [name], RN as Registered Nurse [name], RN as Oncology Nurse Navigator [name], MD as Consulting Physician (Hematology and Oncology) Jonathon Bellows, MD as Consulting Physician (Gastroenterology)  Cancer Staging Breast cancer in female [facility]) Staging form: Breast, AJCC 7th Edition - Clinical: Stage IIIA (T3, N1, M0) - Signed by [name], NP on 02/21/2016 Estrogen receptor status: Positive Progesterone receptor status: Positive    Oncology History Overview Note  # 2016- JAN RUL 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 A. LUNG MASS, RIGHT; CT-GUIDED BIOPSY: - ADENOCARCINOMA, ACINAR AND PAPILLARY MORPHOLOGIES. S/p RT [finished Dec 2017] s/p RT [DEC 2017]; AUG 2019- PET-right middle lobe radiation changes no evidence of recurrence.  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  T1b- submucosal involvement; N0- stage I [[name]; [facility]]; unable to proceed with Endo mucosal resection-because of extensive disease [Dr.Branch; [facility]]; recommend definitive radiation [finished  RT- 09/21/2019]   #September 2020 worsening macrocytic anemia--bone marrow biopsy shows plasma cells up to 5%; unlikely to explain patient's anemia.  MDS is still a possibility-if anemia worsens would recommend peripheral blood NGS.  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] #squamous cell carcinoma esophagus -stage I   CURRENT/MOST RECENT THERAPY: Status post radiation       Breast cancer in female [facility])  07/24/1999 Initial Diagnosis   Breast cancer in female ([place]) T3 N1 M0 tumor ER/PR positive   08/24/1999 -  Anti-estrogen oral therapy   Started tamoxifen   08/24/1999 Surgery   Status post modified radical mastectomy of left breast    Chemotherapy       Radiation Therapy      11/24/2003 - 02/20/2009 Anti-estrogen oral therapy   Started Aromasin   Carcinoma of overlapping sites of left breast in female, estrogen receptor positive ([place])  06/22/2016 Initial Diagnosis   Cancer of overlapping sites of left female breast ([place])   Malignant neoplasm of upper lobe of right lung ([place])  Malignant neoplasm of lower third of esophagus ([place])  05/01/2019 Initial Diagnosis   Malignant neoplasm of lower third of esophagus ([place])     INTERVAL HISTORY: Ambulating independently.  Alone.   Danielle [name] 65 y.o.  female pleasant patient above history of multiple malignancies including most recent stage I squamous cell cancer of esophagus currently status post radiation; and history of severe anemia-CKD stage IV/MGUS is here for follow-up.  Denies any difficulty swallowing.  Denies any fevers or chills.  No nausea no vomiting.  No cough.  No swelling in legs.   Review of Systems  Constitutional:  Positive for malaise/fatigue and weight loss. Negative for chills, diaphoresis and fever.  HENT:  Negative for nosebleeds and sore throat.   Eyes:  Negative for double vision.  Respiratory:  Positive for  shortness of breath. Negative for hemoptysis, sputum production and wheezing.   Cardiovascular:  Negative for chest pain, palpitations, orthopnea and leg swelling.  Gastrointestinal:  Negative for abdominal pain, blood in stool, constipation, heartburn, melena and vomiting.  Genitourinary:  Negative for dysuria, frequency and urgency.  Musculoskeletal:  Negative for back pain and joint pain.  Skin: Negative.  Negative for itching and rash.  Neurological:  Negative for tingling, focal weakness, weakness and headaches.  Endo/Heme/Allergies:  Does not bruise/bleed easily.  Psychiatric/Behavioral:  Negative for depression. The patient is not nervous/anxious and does not have insomnia.      PAST MEDICAL HISTORY :  Past Medical History:  Diagnosis Date  . Anxiety   . Arthritis   . Breast cancer ([place]) 2000  . Cancer ([place]) left    breast cancer 2000, chemo tx's with total mastectomy and lymph nodes resected.   . Cancer of right lung ([place]) 02/21/2016   rad tx's.   . CHF (congestive heart failure) ([place])   . COPD (chronic obstructive pulmonary disease) ([place])   . Dependence on supplemental oxygen   . Depression   . Heart murmur   . Hypertension   . Lung nodule   . Lymphedema   . Personal history of chemotherapy   . Personal history of radiation therapy   . Shortness of breath dyspnea    with exertion  . Status post chemotherapy 2001   left breast cancer  . Status post radiation therapy 2001   left breast cancer    PAST SURGICAL HISTORY :   Past Surgical History:  Procedure Laterality Date  . Breast Biospy Left    [facility]  . BREAST SURGERY    . COLONOSCOPY N/A 04/30/2018   Procedure: COLONOSCOPY;  Surgeon: Virgel Manifold, MD;  Location: [facility] ENDOSCOPY;  Service: Endoscopy;  Laterality: N/A;  . COLONOSCOPY N/A 07/22/2018   Procedure: COLONOSCOPY;  Surgeon: Virgel Manifold, MD;  Location: [facility] ENDOSCOPY;  Service: Endoscopy;  Laterality: N/A;  . DILATION AND CURETTAGE OF UTERUS     . ELECTROMAGNETIC NAVIGATION BROCHOSCOPY Right 04/11/2016   Procedure: ELECTROMAGNETIC NAVIGATION BRONCHOSCOPY;  Surgeon: Vilinda Boehringer, MD;  Location: [facility] ORS;  Service: Cardiopulmonary;  Laterality: Right;  . ESOPHAGOGASTRODUODENOSCOPY N/A 07/22/2018   Procedure: ESOPHAGOGASTRODUODENOSCOPY (EGD);  Surgeon: Virgel Manifold, MD;  Location[facility] END[facility]COPY;  Service: Endoscopy;  Laterality: N/A;  . ESOPHAGOGASTRODUODENOSCOPY (EGD) WITH PROPOFOL N/A 05/07/2018   Procedure: ESOPHAGOGASTRODUODENOSCOPY (EGD) WITH PROPOFOL;  Surgeon: Lucilla Lame, MD;  Location: [facility] ENDOSCOPY;  Service: Endoscopy;  Laterality: N/A;  . ESOPHAGOGASTRODUODENOSCOPY (EGD) WITH PROPOFOL N/A 04/24/2019   Procedure: ESOPHAGOGASTRODUODENOSCOPY (EGD) WITH PROPOFOL;  Surgeon: Jonathon Bellows, MD;  Location: [facility] ENDOSCOPY;  Service: Gastroenterology;  Laterality: N/A;  . ESOPHAGOGASTRODUODENOSCOPY (EGD) WITH PROPOFOL N/A 01/12/2020   Procedure: ESOPHAGOGASTRODUODENOSCOPY (EGD) WITH PROPOFOL;  Surgeon: Jonathon Bellows, MD;  Location: [facility] ENDOSCOPY;  Service: Gastroenterology;  Laterality: N/A;  . ESOPHAGOGASTRODUODENOSCOPY (EGD) WITH PROPOFOL N/A 04/28/2020   Procedure: ESOPHAGOGASTRODUODENOSCOPY (EGD) WITH PROPOFOL;  Surgeon: Jonathon Bellows, MD;  Location: [facility] ENDOSCOPY;  Service: Gastroenterology;  Laterality: N/A;  . EUS N/A 05/07/2019   Procedure: FULL UPPER ENDOSCOPIC ULTRASOUND (EUS) RADIAL;  Surgeon: Jola Schmidt, MD;  Location: [facility] ENDOSCOPY;  Service: Endoscopy;  Laterality: N/A;  . history of colonoscopy]    . ILEOSCOPY N/A 07/22/2018   Procedure: ILEOSCOPY THROUGH STOMA;  Surgeon: Virgel Manifold, MD;  Location: [facility] ENDOSCOPY;  Service: Endoscopy;  Laterality: N/A;  . ILEOSTOMY    . ILEOSTOMY N/A 09/08/2018   Procedure: ILEOSTOMY REVISION POSSIBLE CREATION;  Surgeon: Herbert Pun, MD;  Location: [facility] ORS;  Service: General;  Laterality: N/A;  . ILEOSTOMY CLOSURE N/A 08/15/2018   Procedure: DILATION OF ILEOSTOMY  STRICTURE;  Surgeon: Herbert Pun, MD;  Location: [facility] ORS;  Service: General;  Laterality: N/A;  . LAPAROTOMY Right 05/04/2018   Procedure: EXPLORATORY LAPAROTOMY right colectomy right and left ostomy;  Surgeon: Herbert Pun, MD;  Location: [facility] ORS;  Service: General;  Laterality: Right;  . LUNG BIOPSY    . MASTECTOMY Left    2000, [facility]  . ROTATOR CUFF REPAIR Right    [facility]    FAMILY HISTORY :   Family History  Problem Relation Age of Onset  . Breast cancer Mother 77  . Cancer Mother        Breast   . Cirrhosis Father   . Breast cancer Paternal Aunt 48  . Cancer Maternal Aunt        Breast     SOCIAL HISTORY:   Social History   Tobacco Use  . Smoking status: Former    Packs/day: 0.50    Years: 20.00    Pack years: 10.00    Types: Cigarettes    Quit date: 07/02/2012    Years since quitting: 9.0  . Smokeless tobacco: Current    Types: Snuff  . Tobacco comments:    quit 2014  Vaping Use  . Vaping Use: Never used  Substance Use Topics  . Alcohol use: Not Currently    Comment: Occasionally  . Drug use: No    ALLERGIES:  has No Known Allergies.  MEDICATIONS:  Current Outpatient Medications  Medication Sig Dispense Refill  . acetaminophen (TYLENOL) 325 MG tablet Take 2 tablets (650 mg total) by mouth every 6 (six) hours as needed for mild pain (or Fever >/= 101).    [name] albuterol (PROVENTIL HFA;VENTOLIN HFA) 108 (90 Base) MCG/ACT inhaler Inhale 2 puffs into the lungs every 6 (six) hours as needed for wheezing or shortness of breath. 1 Inhaler 2  . amLODipine (NORVASC) 5 MG tablet amlodipine 5 mg tablet  TAKE ONE TABLET BY MOUTH EVERY DAY FOR BLOOD PRESSURE    . apixaban (ELIQUIS) 2.5 MG TABS tablet Eliquis 2.5 mg tablet    . calcium carbonate (OSCAL) 1500 (600 Ca) MG TABS tablet Take 1 tablet by mouth daily.    . carvedilol (COREG) 12.5 MG tablet carvedilol 12.5 mg tablet    . cholecalciferol (VITAMIN D3) 25 MCG (1000 UNIT) tablet Take 1,000 Units by  mouth daily.    . citalopram (CELEXA) 40 MG tablet Take 40 mg by mouth daily.     . ferrous sulfate 325 (65 FE) MG tablet Take 1 tablet (325 mg total) by mouth 2 (two) times daily with a meal. 60 tablet 3  . Fluticasone-Umeclidin-Vilant 100-62.5-25 MCG/INH AEPB Inhale 1 puff into the lungs daily.     [name] loperamide (IMODIUM) 2 MG capsule Take 2 mg by mouth as needed for diarrhea or loose stools.    [name] losartan-hydrochlorothiazide (HYZAAR) 100-25 MG tablet losartan 100 mg-hydrochlorothiazide 25 mg tablet    . Multiple Vitamin (MULTIVITAMIN WITH MINERALS) TABS tablet Take 1 tablet by mouth daily. 30 tablet 1  . Nutritional Supplements (FEEDING SUPPLEMENT, NEPRO CARB STEADY,) LIQD Take 237 mLs by mouth 3 (three) times daily between meals. 10258 mL 0  . pantoprazole (PROTONIX) 40  MG tablet Take 1 tablet (40 mg total) by mouth 2 (two) times daily before a meal. 60 tablet 2  . predniSONE (DELTASONE) 10 MG tablet Take 1 tablet (10 mg total) by mouth daily with breakfast. 30 tablet 0  . sodium bicarbonate 650 MG tablet Take 2 tablets (1,300 mg total) by mouth 3 (three) times daily. 180 tablet 0  . sucralfate (CARAFATE) 1 GM/10ML suspension Take 10 mLs (1 g total) by mouth 4 (four) times daily. 1200 mL 2   No current facility-administered medications for this visit.    PHYSICAL EXAMINATION: ECOG PERFORMANCE STATUS: 1 - Symptomatic but completely ambulatory  BP 124/73   Pulse 66   Temp (!) 97.3 F (36.3 C) (Tympanic)   Resp 16   Wt 127 lb (57.6 kg)   BMI 21.13 kg/m   Filed Weights   07/27/21 1044  Weight: 127 lb (57.6 kg)    Physical Exam Constitutional:      Comments: Thin built moderately nourished female patient.  She is walking herself.  She is alone.  HENT:     Head: Normocephalic and atraumatic.     Mouth/Throat:     Pharynx: No oropharyngeal exudate.  Eyes:     Pupils: Pupils are equal, round, and reactive to light.  Cardiovascular:     Rate and Rhythm: Normal rate and regular  rhythm.  Pulmonary:     Effort: No respiratory distress.     Breath sounds: No wheezing.     Comments: Decreased breath sounds bilaterally.  Abdominal:     General: Bowel sounds are normal. There is no distension.     Palpations: Abdomen is soft. There is no mass.     Tenderness: There is no abdominal tenderness. There is no guarding or rebound.  Musculoskeletal:        General: No tenderness. Normal range of motion.     Cervical back: Normal range of motion and neck supple.     Comments: Left upper extremity-swollen/no warmth or tenderness.   Skin:    General: Skin is warm.  Neurological:     Mental Status: She is alert and oriented to person, place, and time.  Psychiatric:        Mood and Affect: Affect normal.    LABORATORY DATA:  I have reviewed the data as listed    Component Value Date/Time   NA 138 07/27/2021 1004   NA 132 (L) 02/09/2015 1100   K 3.7 07/27/2021 1004   K 3.8 02/09/2015 1100   CL 108 07/27/2021 1004   CL 95 (L) 02/09/2015 1100   CO2 20 (L) 07/27/2021 1004   CO2 29 02/09/2015 1100   GLUCOSE 82 07/27/2021 1004   GLUCOSE 105 (H) 02/09/2015 1100   BUN 22 07/27/2021 1004   BUN 16 02/09/2015 1100   CREATININE 1.79 (H) 07/27/2021 1004   CREATININE 0.81 02/09/2015 1100   CALCIUM 9.2 07/27/2021 1004   CALCIUM 9.1 02/09/2015 1100   PROT 7.7 07/27/2021 1004   PROT 7.7 02/09/2015 1100   ALBUMIN 3.8 07/27/2021 1004   ALBUMIN 4.3 02/09/2015 1100   AST 21 07/27/2021 1004   AST 29 02/09/2015 1100   ALT 13 07/27/2021 1004   ALT 20 02/09/2015 1100   ALKPHOS 76 07/27/2021 1004   ALKPHOS 69 02/09/2015 1100   BILITOT 0.6 07/27/2021 1004   BILITOT 0.9 02/09/2015 1100   GFRNONAA 31 (L) 07/27/2021 1004   GFRNONAA >60 02/09/2015 1100   GFRAA 37 (L) 06/29/2020 0930  GFRAA >60 02/09/2015 1100    No results found for: SPEP, UPEP  Lab Results  Component Value Date   WBC 5.3 07/27/2021   NEUTROABS 3.0 07/27/2021   HGB 10.3 (L) 07/27/2021   HCT 31.7 (L)  07/27/2021   MCV 97.8 07/27/2021   PLT 253 07/27/2021      Chemistry      Component Value Date/Time   NA 138 07/27/2021 1004   NA 132 (L) 02/09/2015 1100   K 3.7 07/27/2021 1004   K 3.8 02/09/2015 1100   CL 108 07/27/2021 1004   CL 95 (L) 02/09/2015 1100   CO2 20 (L) 07/27/2021 1004   CO2 29 02/09/2015 1100   BUN 22 07/27/2021 1004   BUN 16 02/09/2015 1100   CREATININE 1.79 (H) 07/27/2021 1004   CREATININE 0.81 02/09/2015 1100      Component Value Date/Time   CALCIUM 9.2 07/27/2021 1004   CALCIUM 9.1 02/09/2015 1100   ALKPHOS 76 07/27/2021 1004   ALKPHOS 69 02/09/2015 1100   AST 21 07/27/2021 1004   AST 29 02/09/2015 1100   ALT 13 07/27/2021 1004   ALT 20 02/09/2015 1100   BILITOT 0.6 07/27/2021 1004   BILITOT 0.9 02/09/2015 1100       RADIOGRAPHIC STUDIES: I have personally reviewed the radiological images as listed and agreed with the findings in the report. No results found.   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  Currently s/p definitive radiation [dec 7th 2020].  Posttreatment PET scan February 2021-no evidence of any metastatic disease.  No uptake in the esophagus region. EGD April 28 2020 th- NO evidence of recurrence. STABLE  Continue surveillance. Discussed with [name] re: repeating EGD.  Reminded the patient to call Dr. Georgeann Oppenheim office to make an appointment ASAP.  #Severe anemia-likely secondary to CKD stage IV-currently on Retacrit; hemoglobin today is 10.3-  HOLD  Retacrit today. STABLE.   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 Awaiting labs form today.    # DISPOSITION:  #  HOLD RETACRIT today # in 2 months- H&H- possible retacrit # follow up in 4 months--  cbc/bmp/ possible Retacrit;MM pane;' K/L light chain ratio--Dr.B  No orders of the defined types were placed in this encounter.  All questions were answered. The patient knows to call the clinic with any problems, questions or concerns.      [name], MD 07/27/2021 5:54 PM

## 2021-07-27 NOTE — Progress Notes (Signed)
Patient here for follow up she is currently experiencing a flare up of gout.

## 2021-07-28 LAB — KAPPA/LAMBDA LIGHT CHAINS
Kappa free light chain: 33.5 mg/L — ABNORMAL HIGH (ref 3.3–19.4)
Kappa, lambda light chain ratio: 0.49 (ref 0.26–1.65)
Lambda free light chains: 67.8 mg/L — ABNORMAL HIGH (ref 5.7–26.3)

## 2021-07-31 LAB — MULTIPLE MYELOMA PANEL, SERUM
Albumin SerPl Elph-Mcnc: 3.8 g/dL (ref 2.9–4.4)
Albumin/Glob SerPl: 1.2 (ref 0.7–1.7)
Alpha 1: 0.2 g/dL (ref 0.0–0.4)
Alpha2 Glob SerPl Elph-Mcnc: 0.8 g/dL (ref 0.4–1.0)
B-Globulin SerPl Elph-Mcnc: 1 g/dL (ref 0.7–1.3)
Gamma Glob SerPl Elph-Mcnc: 1.2 g/dL (ref 0.4–1.8)
Globulin, Total: 3.3 g/dL (ref 2.2–3.9)
IgA: 178 mg/dL (ref 87–352)
IgG (Immunoglobin G), Serum: 1366 mg/dL (ref 586–1602)
IgM (Immunoglobulin M), Srm: 27 mg/dL (ref 26–217)
M Protein SerPl Elph-Mcnc: 0.8 g/dL — ABNORMAL HIGH
Total Protein ELP: 7.1 g/dL (ref 6.0–8.5)

## 2021-08-10 ENCOUNTER — Other Ambulatory Visit: Payer: Self-pay | Admitting: Family Medicine

## 2021-08-10 DIAGNOSIS — Z1231 Encounter for screening mammogram for malignant neoplasm of breast: Secondary | ICD-10-CM

## 2021-09-11 ENCOUNTER — Ambulatory Visit
Admission: RE | Admit: 2021-09-11 | Discharge: 2021-09-11 | Disposition: A | Discharge status: Home / Self Care | Payer: Medicare Other | Source: Ambulatory Visit | Attending: Family Medicine | Admitting: Family Medicine

## 2021-09-11 ENCOUNTER — Other Ambulatory Visit: Payer: Self-pay

## 2021-09-11 DIAGNOSIS — Z1231 Encounter for screening mammogram for malignant neoplasm of breast: Secondary | ICD-10-CM | POA: Insufficient documentation

## 2021-09-21 ENCOUNTER — Encounter: Payer: Self-pay | Admitting: Surgery

## 2021-09-21 ENCOUNTER — Ambulatory Visit: Payer: Medicare Other | Admitting: Gastroenterology

## 2021-09-21 ENCOUNTER — Ambulatory Visit: Payer: Medicare Other | Admitting: Anesthesiology

## 2021-09-21 ENCOUNTER — Encounter
Admission: RE | Disposition: A | Discharge status: Home / Self Care | Payer: Self-pay | Source: Home / Self Care | Attending: Surgery

## 2021-09-21 ENCOUNTER — Ambulatory Visit
Admission: RE | Admit: 2021-09-21 | Discharge: 2021-09-21 | Disposition: A | Discharge status: Home / Self Care | Payer: Medicare Other | Attending: Surgery | Admitting: Surgery

## 2021-09-21 ENCOUNTER — Other Ambulatory Visit: Payer: Self-pay

## 2021-09-21 ENCOUNTER — Other Ambulatory Visit: Payer: Self-pay | Admitting: *Deleted

## 2021-09-21 DIAGNOSIS — J449 Chronic obstructive pulmonary disease, unspecified: Secondary | ICD-10-CM | POA: Diagnosis not present

## 2021-09-21 DIAGNOSIS — Z9981 Dependence on supplemental oxygen: Secondary | ICD-10-CM | POA: Diagnosis not present

## 2021-09-21 DIAGNOSIS — C155 Malignant neoplasm of lower third of esophagus: Secondary | ICD-10-CM

## 2021-09-21 DIAGNOSIS — Z9049 Acquired absence of other specified parts of digestive tract: Secondary | ICD-10-CM | POA: Insufficient documentation

## 2021-09-21 DIAGNOSIS — D649 Anemia, unspecified: Secondary | ICD-10-CM | POA: Insufficient documentation

## 2021-09-21 DIAGNOSIS — I4891 Unspecified atrial fibrillation: Secondary | ICD-10-CM | POA: Diagnosis not present

## 2021-09-21 DIAGNOSIS — D631 Anemia in chronic kidney disease: Secondary | ICD-10-CM

## 2021-09-21 DIAGNOSIS — I110 Hypertensive heart disease with heart failure: Secondary | ICD-10-CM | POA: Diagnosis not present

## 2021-09-21 DIAGNOSIS — Z853 Personal history of malignant neoplasm of breast: Secondary | ICD-10-CM | POA: Insufficient documentation

## 2021-09-21 DIAGNOSIS — K5730 Diverticulosis of large intestine without perforation or abscess without bleeding: Secondary | ICD-10-CM | POA: Insufficient documentation

## 2021-09-21 DIAGNOSIS — Z87891 Personal history of nicotine dependence: Secondary | ICD-10-CM | POA: Diagnosis not present

## 2021-09-21 DIAGNOSIS — Z85118 Personal history of other malignant neoplasm of bronchus and lung: Secondary | ICD-10-CM | POA: Diagnosis not present

## 2021-09-21 DIAGNOSIS — F32A Depression, unspecified: Secondary | ICD-10-CM | POA: Insufficient documentation

## 2021-09-21 DIAGNOSIS — K219 Gastro-esophageal reflux disease without esophagitis: Secondary | ICD-10-CM | POA: Insufficient documentation

## 2021-09-21 DIAGNOSIS — Z7901 Long term (current) use of anticoagulants: Secondary | ICD-10-CM | POA: Diagnosis not present

## 2021-09-21 DIAGNOSIS — Z923 Personal history of irradiation: Secondary | ICD-10-CM | POA: Insufficient documentation

## 2021-09-21 DIAGNOSIS — Z932 Ileostomy status: Secondary | ICD-10-CM | POA: Insufficient documentation

## 2021-09-21 DIAGNOSIS — Z8711 Personal history of peptic ulcer disease: Secondary | ICD-10-CM | POA: Diagnosis not present

## 2021-09-21 DIAGNOSIS — F419 Anxiety disorder, unspecified: Secondary | ICD-10-CM | POA: Diagnosis not present

## 2021-09-21 DIAGNOSIS — I509 Heart failure, unspecified: Secondary | ICD-10-CM | POA: Insufficient documentation

## 2021-09-21 HISTORY — PX: COLONOSCOPY WITH PROPOFOL: SHX5780

## 2021-09-21 SURGERY — COLONOSCOPY WITH PROPOFOL
Anesthesia: General

## 2021-09-21 MED ORDER — PROPOFOL 500 MG/50ML IV EMUL
INTRAVENOUS | Status: AC
  Filled 2021-09-21: qty 50

## 2021-09-21 MED ORDER — SODIUM CHLORIDE 0.9 % IV SOLN: INTRAVENOUS | Status: DC

## 2021-09-21 MED ORDER — PROPOFOL 10 MG/ML IV BOLUS
INTRAVENOUS | Status: DC | PRN
  Administered 2021-09-21: 40 mg via INTRAVENOUS
  Administered 2021-09-21: 60 mg via INTRAVENOUS

## 2021-09-21 MED ORDER — PROPOFOL 500 MG/50ML IV EMUL
INTRAVENOUS | Status: DC | PRN
  Administered 2021-09-21: 140 ug/kg/min via INTRAVENOUS

## 2021-09-21 NOTE — H&P (Signed)
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 She had issues with ileostomy stricture and it was able to be revised locally. At that point she was very weak and not stable medically. For the last few year she has been getting stable with adequate blood pressure, hemoglobin and heart rate control. She is also very mobile and strong again. She has been able to tolerate diet.  For the last few months she has been complaining of increased mucus through the mucous fistula. She is also having adequate output through the ileostomy.  She was evaluated by cardiology 2 weeks ago. The assessment was that she was stable and was a very low risk for surgical management. Patient denies any chest pain or shortness of breath. She is able to do 4 METS without chest pain or shortness of breath.    PAST MEDICAL HISTORY:  Past Medical History:  Diagnosis Date   Breast cancer (CMS-HCC)  left; pathology T3N1M0 tumor ER/PR positive   Cervicalgia   COPD (chronic obstructive pulmonary disease) (CMS-HCC)   COPD (chronic obstructive pulmonary disease) (CMS-HCC)   COVID-19 09/2019   Tennis Must Quervain's tenosynovitis   Dependence on continuous supplemental oxygen   Depression (emotion)   Diabetes (CMS-HCC)   Fracture of right distal radius   Fracture of right great toe   History of colonoscopy   History of mastectomy   Hypertension  started medication in 2000   Left knee pain   Lymphedema   Mixed hyperlipidemia   Murmur, cardiac, unspecified   Pain in right lower leg   Pharynx disease   Right hip pain   Right knee pain   Right rotator cuff tear   Screening for thyroid disorder   Sinusitis, unspecified   Trigger thumb, right   VHD (valvular heart disease)   Vitamin D deficiency disease     PAST SURGICAL HISTORY:  Past Surgical History:  Procedure Laterality  Date   de Quervain's release 05/2001   ESOPHAGOGASTRODOUDENOSCOPY W/BIOPSY N/A 06/29/2019  Procedure: ESOPHAGOGASTRODUODENOSCOPY, FLEXIBLE, TRANSORAL; WITH BIOPSY, SINGLE OR MULTIPLE; Surgeon: Sherri Rad, MD; Location: [facility]; Service: Gastroenterology; Laterality: N/A; Lugol's iodine chromoendoscopy.   Left modified radical mastectomy 08/1999  had radiation therapy and chemotherapy   Resection of bone or bone spur 1996  right great toe, reported to have resulted from having had a fracture   Right carpal tunnel release 05/2001   Right rotator cuff repair 12/01/2000   Right thumb trigger release procedure 1985    MEDICATIONS:  Outpatient Encounter Medications as of 08/22/2021  Medication Sig Dispense Refill   acetaminophen (TYLENOL) 325 MG tablet Take 650 mg by mouth every 4 (four) hours as needed for Pain   albuterol 90 mcg/actuation inhaler Inhale 2 inhalations into the lungs every 6 (six) hours as needed for Wheezing 1 each 6   amLODIPine (NORVASC) 5 MG tablet Take 1 tablet by mouth once daily   apixaban (ELIQUIS) 2.5 mg tablet Take 1 tablet (2.5 mg total) by mouth every 12 (twelve) hours 180 tablet 1   calcium carbonate (TUMS) 200 mg calcium (500 mg) chewable tablet Take 0.5 tablets by mouth once daily   calcium carbonate/vitamin D3 (CALCIUM WITH VITAMIN D ORAL) Take 1 capsule by mouth once daily   carvediloL (COREG) 6.25 MG tablet Take 1 tablet by mouth once daily   citalopram (CELEXA) 40 MG  tablet Take 40 mg by mouth once daily   ferrous sulfate 325 (65 FE) MG EC tablet Take 325 mg by mouth 2 (two) times daily with meals   fluticasone-umeclidinium-vilanterol (TRELEGY ELLIPTA) 100-62.5-25 mcg inhaler Inhale 1 inhalation into the lungs once daily 1 each 6   loperamide (IMODIUM A-D) 2 mg tablet Take by mouth once daily as needed   multivitamin tablet Take 1 tablet by mouth once daily   pantoprazole (PROTONIX) 40 MG DR tablet Take 40 mg by mouth 2 (two) times  daily   sucralfate (CARAFATE) 100 mg/mL suspension Take by mouth as needed   TORsemide (DEMADEX) 20 MG tablet Take 20 mg by mouth once daily   zolpidem (AMBIEN) 5 MG tablet Take 5 mg by mouth at bedtime as needed for Sleep   No facility-administered encounter medications on file as of 08/22/2021.    ALLERGIES:  Patient has no known allergies.  SOCIAL HISTORY:  Social History   Socioeconomic History   Marital status: Divorced  Tobacco Use   Smoking status: Former  Packs/day: 1.00  Years: 38.00  Pack years: 38.00  Types: Cigarettes  Quit date: 06/26/2013  Years since quitting: 8.1   Smokeless tobacco: Former  Types: Snuff  Quit date: 01/02/2016   Tobacco comments:  Had been a PPD smoker.  Vaping Use   Vaping Use: Never used  Substance and Sexual Activity   Alcohol use: Not Currently  Alcohol/week: 3.0 standard drinks  Types: 3 Cans of beer per week  Comment: little use   Drug use: No   Sexual activity: Never  Partners: Male   FAMILY HISTORY:  Family History  Problem Relation Age of Onset   Cirrhosis Father  liver   Breast cancer Mother   Diabetes Mother  insulin-dependent   Breast cancer Maternal Aunt  history in paternal aunts   Breast cancer Paternal Aunt   Anesthesia problems Neg Hx    GENERAL REVIEW OF SYSTEMS:   General ROS: negative for - chills, fatigue, fever, weight gain or weight loss Allergy and Immunology ROS: negative for - hives  Hematological and Lymphatic ROS: negative for - bleeding problems or bruising, negative for palpable nodes Endocrine ROS: negative for - heat or cold intolerance, hair changes Respiratory ROS: negative for - cough, shortness of breath or wheezing Cardiovascular ROS: no chest pain or palpitations GI ROS: negative for nausea, vomiting, abdominal pain, diarrhea, constipation Musculoskeletal ROS: negative for - joint swelling or muscle pain Neurological ROS: negative for - confusion, syncope Dermatological ROS: negative  for pruritus and rash  PHYSICAL EXAM:  Vitals:  08/22/21 0908  BP: 131/86  Pulse: (!) 114  .  Ht:167.6 cm (5\' 6" ) Wt:57.6 kg (127 lb) WEX:HBZJ surface area is 1.64 meters squared. Body mass index is 20.5 kg/m.[name]  GENERAL: Alert, active, oriented x3  HEENT: Pupils equal reactive to light. Extraocular movements are intact. Sclera clear. Palpebral conjunctiva normal red color.Pharynx clear.  NECK: Supple with no palpable mass and no adenopathy.  LUNGS: Sound clear with no rales rhonchi or wheezes.  HEART: Regular rhythm S1 and S2 without murmur.  ABDOMEN: Soft and depressible, nontender with no palpable mass, no hepatomegaly. Ileostomy and mucous fistula pink and patent.  EXTREMITIES: Well-developed well-nourished symmetrical with no dependent edema.  NEUROLOGICAL: Awake alert oriented, facial expression symmetrical, moving all extremities.   IMPRESSION:   Ileostomy status  Patient can continue she will increase output through the mucous fistula. I personally evaluated the CT scan done in January. This showed a  large bulk of stool in the rectum. I think at this stool might be causing some type of obstruction. This is causing the backing above the mucous to the mucous fistula.  Patient has been evaluated by cardiology. They feel that she is a great candidate for surgical management. At this moment patient is ready for ileostomy takedown. I discussed with her the plan of doing a minimally invasive and ileostomy takedown and connected to the transverse colon. We will coordinate colonoscopy for evaluation of the transverse descending and sigmoid colon. Patient will do Fleet enema before surgery to try to remove the old stool.  Patient oriented about the risk of surgery includes bleeding, infection, injury to intestine, fistula, leak of anastomosis, obstruction, internal infections, intracranial fistula, among others. Also patient high risk for surgery due to her history of A. fib on  anticoagulation. As per cardiology she can hold anticoagulation up to 3 days before surgery.  Patient endorses she understood and agreed to proceed with plan.   PLAN:  1. We will get you ready for your ileostomy takedown. The surgery will be at the beginning of January. Robotic assisted laparoscopic ileostomy takedown [id number]) 2. Before his surgery we will coordinate colonoscopy. 3. For the colonoscopy we will use 2 Fleet enemas. -Apply the first Fleet enema the day before the colonoscopy -Applied a second enema the morning of the colonoscopy 4. We will need to stop Eliquis 2 days before the colonoscopy and 2 days before the surgery 5. Cardiology clearance done 6. CBC, CMP 4. Contact us if you have any concern.  Patient verbalized understanding, all questions were answered, and were agreeable with the plan outlined above.

## 2021-09-21 NOTE — Transfer of Care (Signed)
Immediate Anesthesia Transfer of Care Note  Patient: Danielle [name]  Procedure(s) Performed: COLONOSCOPY WITH PROPOFOL  Patient Location: PACU  Anesthesia Type:General  Level of Consciousness: awake and sedated  Airway & Oxygen Therapy: Patient Spontanous Breathing and Patient connected to nasal cannula oxygen  Post-op Assessment: Report given to RN and Post -op Vital signs reviewed and stable  Post vital signs: Reviewed  Last Vitals:  Vitals Value Taken Time  BP    Temp    Pulse    Resp    SpO2      Last Pain:  Vitals:   09/21/21 1210  TempSrc: Temporal  PainSc: 0-No pain         Complications: No notable events documented.

## 2021-09-21 NOTE — Anesthesia Preprocedure Evaluation (Addendum)
Anesthesia Evaluation  Patient identified by MRN, date of birth, ID band Patient awake    Reviewed: Allergy & Precautions, H&P , NPO status , Patient's Chart, lab work & pertinent test results  History of Anesthesia Complications Negative for: history of anesthetic complications  Airway Mallampati: II  TM Distance: >3 FB     Dental  (+)    Pulmonary shortness of breath and Long-Term Oxygen Therapy, neg sleep apnea, COPD, former smoker,  Lung CA Wears 2L [place] PRN for SOB   Pulmonary exam normal        Cardiovascular hypertension, (-) angina+CHF  (-) Past MI and (-) Cardiac Stents negative cardio ROS Normal cardiovascular exam(-) dysrhythmias   Echo 11/07/19: 1. Left ventricular ejection fraction, by visual estimation, is 50 to 55%. The left ventricle has low normal function. Left ventricular septal wall thickness was mildly increased. Mildly increased left ventricular posterior wall thickness. There is mildly increased left ventricular hypertrophy.  2. Left ventricular diastolic parameters are consistent with Grade I diastolic dysfunction (impaired relaxation).  3. Small to moderate inferior myocardial infarction.  4. The left ventricle demonstrates regional wall motion abnormalities.  5. Global right ventricle has normal systolic function.The right ventricular size is normal. No increase in right ventricular wall thickness.  6. Left atrial size was normal.  7. Right atrial size was normal.  8. The mitral valve is normal in structure. No evidence of mitral valve regurgitation.  9. The tricuspid valve is normal in structure.  10. The tricuspid valve is normal in structure. Tricuspid valve regurgitation is mild.  11. The aortic valve is normal in structure. Aortic valve regurgitation is not visualized. Mild aortic valve sclerosis without stenosis.  12. The pulmonic valve was grossly normal. Pulmonic valve regurgitation is not  visualized.  13. Moderately elevated pulmonary artery systolic pressure.    Neuro/Psych PSYCHIATRIC DISORDERS Anxiety Depression negative neurological ROS     GI/Hepatic Neg liver ROS, PUD, GERD  ,Ileostomy, plan for takedown Posted for colonoscopy for evaluation of the transverse descending and sigmoid colon prior to surgery   Endo/Other  negative endocrine ROS  Renal/GU Renal disease (CKD)  negative genitourinary   Musculoskeletal  (+) Arthritis ,   Abdominal   Peds  Hematology  (+) Blood dyscrasia (Hgb 10.3 g/dL on 07/27/21), anemia ,   Anesthesia Other Findings Past Medical History: No date: Anxiety No date: Arthritis 2000: Breast cancer ([place]) left : Cancer ([place])     Comment:  breast cancer 2000, chemo tx's with total mastectomy and              lymph nodes resected.  02/21/2016: Cancer of right lung ([place])     Comment:  rad tx's.  No date: CHF (congestive heart failure) (HCC) No date: COPD (chronic obstructive pulmonary disease) (HCC) No date: Dependence on supplemental oxygen No date: Depression No date: Heart murmur No date: Hypertension No date: Lung nodule No date: Lymphedema No date: Personal history of chemotherapy No date: Personal history of radiation therapy No date: Shortness of breath dyspnea     Comment:  with exertion 2001: Status post chemotherapy     Comment:  left breast cancer 2001: Status post radiation therapy     Comment:  left breast cancer  Past Surgical History: No date: Breast Biospy; Left     Comment:  [facility] No date: BREAST SURGERY 04/30/2018: COLONOSCOPY; N/A     Comment:  Procedure: COLONOSCOPY;  Surgeon: Virgel Manifold,  MD;  Location: [facility];  Service: Endoscopy;                Laterality: N/A; 07/22/2018: COLONOSCOPY; N/A     Comment:  Procedure: COLONOSCOPY;  Surgeon: Virgel Manifold,               MD;  Location: [facility];  Service: Endoscopy;                Laterality: N/A; No date:  DILATION AND CURETTAGE OF UTERUS 04/11/2016: ELECTROMAGNETIC NAVIGATION BROCHOSCOPY; Right     Comment:  Procedure: ELECTROMAGNETIC NAVIGATION BRONCHOSCOPY;                Surgeon: Vilinda Boehringer, MD;  Location: [facility]S;                Service: Cardiopulmonary;  Laterality: Right; 07/22/2018: ESOPHAGOGASTRODUODENOSCOPY; N/A     Comment:  Procedure: ESOPHAGOGASTRODUODENOSCOPY (EGD);  Surgeon:               Virgel Manifold, MD;  Location: [facility] ENDOSCOPY;                Service: Endoscopy;  Laterality: N/A; 05/07/2018: ESOPHAGOGASTRODUODENOSCOPY (EGD) WITH PROPOFOL; N/A     Comment:  Procedure: ESOPHAGOGASTRODUODENOSCOPY (EGD) WITH               PROPOFOL;  Surgeon: Lucilla Lame, MD;  Location: [facility]               ENDOSCOPY;  Service: Endoscopy;  Laterality: N/A; 04/24/2019: ESOPHAGOGASTRODUODENOSCOPY (EGD) WITH PROPOFOL; N/A     Comment:  Procedure: ESOPHAGOGASTRODUODENOSCOPY (EGD) WITH               PROPOFOL;  Surgeon: Jonathon Bellows, MD;  Location: [facility]               ENDOSCOPY;  Service: Gastroenterology;  Laterality: N/A; 01/12/2020: ESOPHAGOGASTRODUODENOSCOPY (EGD) WITH PROPOFOL; N/A     Comment:  Procedure: ESOPHAGOGASTRODUODENOSCOPY (EGD) WITH               PROPOFOL;  Surgeon: Jonathon Bellows, MD;  Location: [facility]               ENDOSCOPY;  Service: Gastroenterology;  Laterality: N/A; 04/28/2020: ESOPHAGOGASTRODUODENOSCOPY (EGD) WITH PROPOFOL; N/A     Comment:  Procedure: ESOPHAGOGASTRODUODENOSCOPY (EGD) WITH               PROPOFOL;  Surgeon: Jonathon Bellows, MD;  Location: [facility]               ENDOSCOPY;  Service: Gastroenterology;  Laterality: N/A; 05/07/2019: EUS; N/A     Comment:  Procedure: FULL UPPER ENDOSCOPIC ULTRASOUND (EUS)               RADIAL;  Surgeon: Jola Schmidt, MD;  Location: [facility]               ENDOSCOPY;  Service: Endoscopy;  Laterality: N/A; No date: history of colonoscopy] 07/22/2018: ILEOSCOPY; N/A     Comment:  Procedure: ILEOSCOPY THROUGH STOMA;  Surgeon: Virgel Manifold, MD;  Location: [facility] ENDOSCOPY;  Service:               Endoscopy;  Laterality: N/A; No date: ILEOSTOMY 09/08/2018: ILEOSTOMY; N/A     Comment:  Procedure: ILEOSTOMY REVISION POSSIBLE CREATION;                Surgeon: Herbert Pun, MD;  Location: [facility] ORS;               Service: General;  Laterality: N/A; 08/15/2018: ILEOSTOMY CLOSURE; N/A     Comment:  Procedure: DILATION OF ILEOSTOMY STRICTURE;  Surgeon:               Herbert Pun, MD;  Location: [facility]S;  Service:              General;  Laterality: N/A; 05/04/2018: LAPAROTOMY; Right     Comment:  Procedure: EXPLORATORY LAPAROTOMY right colectomy right               and left ostomy;  Surgeon: Herbert Pun, MD;                Location: [facility]S;  Service: General;  Laterality:               Right; No date: LUNG BIOPSY No date: MASTECTOMY; Left     Comment:  2000, [facility] No date: ROTATOR CUFF REPAIR; Right     Comment:  [facility]  BMI    Body Mass Index: 20.63 kg/m      Reproductive/Obstetrics negative OB ROS                            Anesthesia Physical Anesthesia Plan  ASA: 3  Anesthesia Plan: General   Post-op Pain Management:    Induction:   PONV Risk Score and Plan: Propofol infusion and TIVA  Airway Management Planned: Simple Face Mask  Additional Equipment:   Intra-op Plan:   Post-operative Plan:   Informed Consent: I have reviewed the patients History and Physical, chart, labs and discussed the procedure including the risks, benefits and alternatives for the proposed anesthesia with the patient or authorized representative who has indicated his/her understanding and acceptance.     Dental Advisory Given  Plan Discussed with: Anesthesiologist, CRNA and Surgeon  Anesthesia Plan Comments:        Anesthesia Quick Evaluation

## 2021-09-21 NOTE — Op Note (Signed)
[facility] Gastroenterology Patient Name: Danielle [name] Procedure Date: 09/21/2021 1:00 PM MRN: [id number] Account #: [id number] Date of Birth: 1956-04-12 Admit Type: Outpatient Age: 65 Room: [facility] ENDO ROOM 3 Gender: Female Note Status: Finalized Instrument Name: Park Meo [id number] Procedure:             Colonoscopy Indications:           Diverticulosis of the colon, OTHER Providers:             Eliseo Squires MD, MD Referring MD:          No Local Md, MD (Referring MD) Medicines:             Propofol per Anesthesia Complications:         No immediate complications. Procedure:             Pre-Anesthesia Assessment:                        - After reviewing the risks and benefits, the patient                         was deemed in satisfactory condition to undergo the                         procedure in an ambulatory setting.                        After obtaining informed consent, the colonoscope was                         passed under direct vision. Throughout the procedure,                         the patient's blood pressure, pulse, and oxygen                         saturations were monitored continuously. The                         Colonoscope was introduced through the anus and                         advanced to the the surgical stoma. The colonoscopy                         was somewhat difficult due to post-surgical anatomy.                         Successful completion of the procedure was aided by                         withdrawing and reinserting the scope. Findings:      The perianal and digital rectal examinations were normal.      A diffuse area of mildly friable (with contact bleeding) mucosa was       found in the sigmoid colon.      A diffuse area of mildly friable (with contact bleeding) mucosa was       found in the descending colon.      friable mucosa as expected from lack of use. tortuous  sigmoid, and       retained stool in rectum, but  otherwise no other abnormalities Impression:            - Friable (with contact bleeding) mucosa in the                         sigmoid colon.                        [name] (with contact bleeding) mucosa in the                         descending colon.                        - No specimens collected. Recommendation:        - Written discharge instructions were provided to the                         patient.                        - Discharge patient to home.                        - Resume previous diet.                        - Repeat colonoscopy PRN. Procedure Code(s):     --- Professional ---                        [phone], Colonoscopy, flexible; diagnostic, including                         collection of specimen(s) by brushing or washing, when                         performed (separate procedure) Diagnosis Code(s):     --- Professional ---                        K57.30, Diverticulosis of large intestine without                         perforation or abscess without bleeding CPT copyright 2019 American [employer]. All rights reserved. The codes documented in this report are preliminary and upon coder review may  be revised to meet current compliance requirements. Dr. Sheppard Penton, MD Eliseo Squires MD, MD 09/21/2021 1:31:33 PM This report has been signed electronically. Number of Addenda: 0 Note Initiated On: 09/21/2021 1:00 PM Total Procedure Duration: 0 hours 18 minutes 49 seconds  Estimated Blood Loss:  Estimated blood loss was minimal.      [facility]

## 2021-09-22 ENCOUNTER — Encounter: Payer: Self-pay | Admitting: Surgery

## 2021-09-22 NOTE — Anesthesia Postprocedure Evaluation (Signed)
Anesthesia Post Note  Patient: Danielle [name]  Procedure(s) Performed: COLONOSCOPY WITH PROPOFOL  Patient location during evaluation: PACU Anesthesia Type: General Level of consciousness: awake and alert Pain management: pain level controlled Vital Signs Assessment: post-procedure vital signs reviewed and stable Respiratory status: spontaneous breathing, nonlabored ventilation, respiratory function stable and patient connected to nasal cannula oxygen Cardiovascular status: blood pressure returned to baseline and stable Postop Assessment: no apparent nausea or vomiting Anesthetic complications: no   No notable events documented.   Last Vitals:  Vitals:   09/21/21 1337 09/21/21 1338  BP: 93/75 93/75  Pulse:    Resp:    Temp:    SpO2: 95%     Last Pain:  Vitals:   09/22/21 0730  TempSrc:   PainSc: 0-No pain                 Brett Canales Othmar Ringer

## 2021-09-28 ENCOUNTER — Inpatient Hospital Stay: Payer: Medicare Other

## 2021-10-04 ENCOUNTER — Inpatient Hospital Stay: Payer: Medicare Other | Attending: Internal Medicine

## 2021-10-04 ENCOUNTER — Inpatient Hospital Stay: Payer: Medicare Other

## 2021-10-04 ENCOUNTER — Ambulatory Visit: Payer: Self-pay | Admitting: General Surgery

## 2021-10-04 ENCOUNTER — Other Ambulatory Visit: Payer: Self-pay

## 2021-10-04 DIAGNOSIS — D631 Anemia in chronic kidney disease: Secondary | ICD-10-CM | POA: Insufficient documentation

## 2021-10-04 DIAGNOSIS — C155 Malignant neoplasm of lower third of esophagus: Secondary | ICD-10-CM

## 2021-10-04 DIAGNOSIS — Z8501 Personal history of malignant neoplasm of esophagus: Secondary | ICD-10-CM | POA: Insufficient documentation

## 2021-10-04 DIAGNOSIS — N184 Chronic kidney disease, stage 4 (severe): Secondary | ICD-10-CM | POA: Diagnosis not present

## 2021-10-04 LAB — HEMOGLOBIN: Hemoglobin: 10.4 g/dL — ABNORMAL LOW (ref 12.0–15.0)

## 2021-10-04 LAB — HEMATOCRIT: HCT: 31.9 % — ABNORMAL LOW (ref 36.0–46.0)

## 2021-10-04 NOTE — H&P (Signed)
HISTORY OF PRESENT ILLNESS:   Ms. Fineberg is a 65 y.o.female patient who comes for reevaluation of her ileostomy status.  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 She had issues with ileostomy stricture and it was able to be revised locally. At that point she was very weak and not stable medically. For the last few year she has been getting stable with adequate blood pressure, hemoglobin and heart rate control. She is also very mobile and strong again. She has been able to tolerate diet.  For the last few months she has been complaining of increased mucus through the mucous fistula. She is also having adequate output through the ileostomy.  She was evaluated by cardiology 2 weeks ago. The assessment was that she was stable and was a very low risk for surgical management. Patient denies any chest pain or shortness of breath. She is able to do 4 METS without chest pain or shortness of breath.    PAST MEDICAL HISTORY:  Past Medical History:  Diagnosis Date   Breast cancer (CMS-HCC)  left; pathology T3N1M0 tumor ER/PR positive   Cervicalgia   COPD (chronic obstructive pulmonary disease) (CMS-HCC)   COPD (chronic obstructive pulmonary disease) (CMS-HCC)   COVID-19 09/2019   Tennis Must Quervain's tenosynovitis   Dependence on continuous supplemental oxygen   Depression (emotion)   Diabetes (CMS-HCC)   Fracture of right distal radius   Fracture of right great toe   History of colonoscopy   History of mastectomy   Hypertension  started medication in 2000   Left knee pain   Lymphedema   Mixed hyperlipidemia   Murmur, cardiac, unspecified   Pain in right lower leg   Pharynx disease   Right hip pain   Right knee pain   Right rotator cuff tear   Screening for thyroid disorder   Sinusitis, unspecified   Trigger thumb, right   VHD (valvular heart disease)   Vitamin D deficiency disease     PAST SURGICAL HISTORY:  Past Surgical History:  Procedure Laterality  Date   de Quervain's release 05/2001   ESOPHAGOGASTRODOUDENOSCOPY W/BIOPSY N/A 06/29/2019  Procedure: ESOPHAGOGASTRODUODENOSCOPY, FLEXIBLE, TRANSORAL; WITH BIOPSY, SINGLE OR MULTIPLE; Surgeon: Sherri Rad, MD; Location: [facility]; Service: Gastroenterology; Laterality: N/A; Lugol's iodine chromoendoscopy.   Left modified radical mastectomy 08/1999  had radiation therapy and chemotherapy   Resection of bone or bone spur 1996  right great toe, reported to have resulted from having had a fracture   Right carpal tunnel release 05/2001   Right rotator cuff repair 12/01/2000   Right thumb trigger release procedure 1985    MEDICATIONS:  Outpatient Encounter Medications as of 08/22/2021  Medication Sig Dispense Refill   acetaminophen (TYLENOL) 325 MG tablet Take 650 mg by mouth every 4 (four) hours as needed for Pain   albuterol 90 mcg/actuation inhaler Inhale 2 inhalations into the lungs every 6 (six) hours as needed for Wheezing 1 each 6   amLODIPine (NORVASC) 5 MG tablet Take 1 tablet by mouth once daily   apixaban (ELIQUIS) 2.5 mg tablet Take 1 tablet (2.5 mg total) by mouth every 12 (twelve) hours 180 tablet 1   calcium carbonate (TUMS) 200 mg calcium (500 mg) chewable tablet Take 0.5 tablets by mouth once daily   calcium carbonate/vitamin D3 (CALCIUM WITH VITAMIN D ORAL) Take 1 capsule by mouth once daily   carvediloL (COREG) 6.25 MG tablet Take 1 tablet by mouth once daily   citalopram (CELEXA) 40 MG  tablet Take 40 mg by mouth once daily   ferrous sulfate 325 (65 FE) MG EC tablet Take 325 mg by mouth 2 (two) times daily with meals   fluticasone-umeclidinium-vilanterol (TRELEGY ELLIPTA) 100-62.5-25 mcg inhaler Inhale 1 inhalation into the lungs once daily 1 each 6   loperamide (IMODIUM A-D) 2 mg tablet Take by mouth once daily as needed   multivitamin tablet Take 1 tablet by mouth once daily   pantoprazole (PROTONIX) 40 MG DR tablet Take 40 mg by mouth 2 (two) times  daily   sucralfate (CARAFATE) 100 mg/mL suspension Take by mouth as needed   TORsemide (DEMADEX) 20 MG tablet Take 20 mg by mouth once daily   zolpidem (AMBIEN) 5 MG tablet Take 5 mg by mouth at bedtime as needed for Sleep   No facility-administered encounter medications on file as of 08/22/2021.    ALLERGIES:  Patient has no known allergies.  SOCIAL HISTORY:  Social History   Socioeconomic History   Marital status: Divorced  Tobacco Use   Smoking status: Former  Packs/day: 1.00  Years: 38.00  Pack years: 38.00  Types: Cigarettes  Quit date: 06/26/2013  Years since quitting: 8.1   Smokeless tobacco: Former  Types: Snuff  Quit date: 01/02/2016   Tobacco comments:  Had been a PPD smoker.  Vaping Use   Vaping Use: Never used  Substance and Sexual Activity   Alcohol use: Not Currently  Alcohol/week: 3.0 standard drinks  Types: 3 Cans of beer per week  Comment: little use   Drug use: No   Sexual activity: Never  Partners: Male   FAMILY HISTORY:  Family History  Problem Relation Age of Onset   Cirrhosis Father  liver   Breast cancer Mother   Diabetes Mother  insulin-dependent   Breast cancer Maternal Aunt  history in paternal aunts   Breast cancer Paternal Aunt   Anesthesia problems Neg Hx    GENERAL REVIEW OF SYSTEMS:   General ROS: negative for - chills, fatigue, fever, weight gain or weight loss Allergy and Immunology ROS: negative for - hives  Hematological and Lymphatic ROS: negative for - bleeding problems or bruising, negative for palpable nodes Endocrine ROS: negative for - heat or cold intolerance, hair changes Respiratory ROS: negative for - cough, shortness of breath or wheezing Cardiovascular ROS: no chest pain or palpitations GI ROS: negative for nausea, vomiting, abdominal pain, diarrhea, constipation Musculoskeletal ROS: negative for - joint swelling or muscle pain Neurological ROS: negative for - confusion, syncope Dermatological ROS: negative  for pruritus and rash  PHYSICAL EXAM:  Vitals:  08/22/21 0908  BP: 131/86  Pulse: (!) 114  .  Ht:167.6 cm (5\' 6" ) Wt:57.6 kg (127 lb) EVO:JJKK surface area is 1.64 meters squared. Body mass index is 20.5 kg/m.[name]  GENERAL: Alert, active, oriented x3  HEENT: Pupils equal reactive to light. Extraocular movements are intact. Sclera clear. Palpebral conjunctiva normal red color.Pharynx clear.  NECK: Supple with no palpable mass and no adenopathy.  LUNGS: Sound clear with no rales rhonchi or wheezes.  HEART: Regular rhythm S1 and S2 without murmur.  ABDOMEN: Soft and depressible, nontender with no palpable mass, no hepatomegaly. Ileostomy and mucous fistula pink and patent.  EXTREMITIES: Well-developed well-nourished symmetrical with no dependent edema.  NEUROLOGICAL: Awake alert oriented, facial expression symmetrical, moving all extremities.   IMPRESSION:   Ileostomy status  Patient can continue she will increase output through the mucous fistula. I personally evaluated the CT scan done in January. This showed a  large bulk of stool in the rectum. I think at this stool might be causing some type of obstruction. This is causing the backing above the mucous to the mucous fistula.  Patient has been evaluated by cardiology. They feel that she is a great candidate for surgical management. At this moment patient is ready for ileostomy takedown. I discussed with her the plan of doing a minimally invasive and ileostomy takedown and connected to the transverse colon. We will coordinate colonoscopy for evaluation of the transverse descending and sigmoid colon. Patient will do Fleet enema before surgery to try to remove the old stool.  Patient oriented about the risk of surgery includes bleeding, infection, injury to intestine, fistula, leak of anastomosis, obstruction, internal infections, intracranial fistula, among others. Also patient high risk for surgery due to her history of A. fib on  anticoagulation. As per cardiology she can hold anticoagulation up to 3 days before surgery.  Patient endorses she understood and agreed to proceed with plan.   PLAN:  1. We will get you ready for your ileostomy takedown. The surgery will be at the beginning of January. Robotic assisted laparoscopic ileostomy takedown [phone]) 2. Before his surgery we will coordinate colonoscopy. 3. For the colonoscopy we will use 2 Fleet enemas. -Apply the first Fleet enema the day before the colonoscopy -Applied a second enema the morning of the colonoscopy 4. We will need to stop Eliquis 2 days before the colonoscopy and 2 days before the surgery 5. Cardiology clearance done 6. CBC, CMP 4. Contact us if you have any concern.  Patient verbalized understanding, all questions were answered, and were agreeable with the plan outlined above.   Herbert Pun, MD

## 2021-10-04 NOTE — H&P (View-Only) (Signed)
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 She had issues with ileostomy stricture and it was able to be revised locally. At that point she was very weak and not stable medically. For the last few year she has been getting stable with adequate blood pressure, hemoglobin and heart rate control. She is also very mobile and strong again. She has been able to tolerate diet.  For the last few months she has been complaining of increased mucus through the mucous fistula. She is also having adequate output through the ileostomy.  She was evaluated by cardiology 2 weeks ago. The assessment was that she was stable and was a very low risk for surgical management. Patient denies any chest pain or shortness of breath. She is able to do 4 METS without chest pain or shortness of breath.    PAST MEDICAL HISTORY:  Past Medical History:  Diagnosis Date   Breast cancer (CMS-HCC)  left; pathology T3N1M0 tumor ER/PR positive   Cervicalgia   COPD (chronic obstructive pulmonary disease) (CMS-HCC)   COPD (chronic obstructive pulmonary disease) (CMS-HCC)   COVID-19 09/2019   Tennis Must Quervain's tenosynovitis   Dependence on continuous supplemental oxygen   Depression (emotion)   Diabetes (CMS-HCC)   Fracture of right distal radius   Fracture of right great toe   History of colonoscopy   History of mastectomy   Hypertension  started medication in 2000   Left knee pain   Lymphedema   Mixed hyperlipidemia   Murmur, cardiac, unspecified   Pain in right lower leg   Pharynx disease   Right hip pain   Right knee pain   Right rotator cuff tear   Screening for thyroid disorder   Sinusitis, unspecified   Trigger thumb, right   VHD (valvular heart disease)   Vitamin D deficiency disease     PAST SURGICAL HISTORY:  Past Surgical History:  Procedure Laterality  Date   de Quervain's release 05/2001   ESOPHAGOGASTRODOUDENOSCOPY W/BIOPSY N/A 06/29/2019  Procedure: ESOPHAGOGASTRODUODENOSCOPY, FLEXIBLE, TRANSORAL; WITH BIOPSY, SINGLE OR MULTIPLE; Surgeon: Sherri Rad, MD; Location: [facility]; Service: Gastroenterology; Laterality: N/A; Lugol's iodine chromoendoscopy.   Left modified radical mastectomy 08/1999  had radiation therapy and chemotherapy   Resection of bone or bone spur 1996  right great toe, reported to have resulted from having had a fracture   Right carpal tunnel release 05/2001   Right rotator cuff repair 12/01/2000   Right thumb trigger release procedure 1985    MEDICATIONS:  Outpatient Encounter Medications as of 08/22/2021  Medication Sig Dispense Refill   acetaminophen (TYLENOL) 325 MG tablet Take 650 mg by mouth every 4 (four) hours as needed for Pain   albuterol 90 mcg/actuation inhaler Inhale 2 inhalations into the lungs every 6 (six) hours as needed for Wheezing 1 each 6   amLODIPine (NORVASC) 5 MG tablet Take 1 tablet by mouth once daily   apixaban (ELIQUIS) 2.5 mg tablet Take 1 tablet (2.5 mg total) by mouth every 12 (twelve) hours 180 tablet 1   calcium carbonate (TUMS) 200 mg calcium (500 mg) chewable tablet Take 0.5 tablets by mouth once daily   calcium carbonate/vitamin D3 (CALCIUM WITH VITAMIN D ORAL) Take 1 capsule by mouth once daily   carvediloL (COREG) 6.25 MG tablet Take 1 tablet by mouth once daily   citalopram (CELEXA) 40 MG  tablet Take 40 mg by mouth once daily   ferrous sulfate 325 (65 FE) MG EC tablet Take 325 mg by mouth 2 (two) times daily with meals   fluticasone-umeclidinium-vilanterol (TRELEGY ELLIPTA) 100-62.5-25 mcg inhaler Inhale 1 inhalation into the lungs once daily 1 each 6   loperamide (IMODIUM A-D) 2 mg tablet Take by mouth once daily as needed   multivitamin tablet Take 1 tablet by mouth once daily   pantoprazole (PROTONIX) 40 MG DR tablet Take 40 mg by mouth 2 (two) times  daily   sucralfate (CARAFATE) 100 mg/mL suspension Take by mouth as needed   TORsemide (DEMADEX) 20 MG tablet Take 20 mg by mouth once daily   zolpidem (AMBIEN) 5 MG tablet Take 5 mg by mouth at bedtime as needed for Sleep   No facility-administered encounter medications on file as of 08/22/2021.    ALLERGIES:  Patient has no known allergies.  SOCIAL HISTORY:  Social History   Socioeconomic History   Marital status: Divorced  Tobacco Use   Smoking status: Former  Packs/day: 1.00  Years: 38.00  Pack years: 38.00  Types: Cigarettes  Quit date: 06/26/2013  Years since quitting: 8.1   Smokeless tobacco: Former  Types: Snuff  Quit date: 01/02/2016   Tobacco comments:  Had been a PPD smoker.  Vaping Use   Vaping Use: Never used  Substance and Sexual Activity   Alcohol use: Not Currently  Alcohol/week: 3.0 standard drinks  Types: 3 Cans of beer per week  Comment: little use   Drug use: No   Sexual activity: Never  Partners: Male   FAMILY HISTORY:  Family History  Problem Relation Age of Onset   Cirrhosis Father  liver   Breast cancer Mother   Diabetes Mother  insulin-dependent   Breast cancer Maternal Aunt  history in paternal aunts   Breast cancer Paternal Aunt   Anesthesia problems Neg Hx    GENERAL REVIEW OF SYSTEMS:   General ROS: negative for - chills, fatigue, fever, weight gain or weight loss Allergy and Immunology ROS: negative for - hives  Hematological and Lymphatic ROS: negative for - bleeding problems or bruising, negative for palpable nodes Endocrine ROS: negative for - heat or cold intolerance, hair changes Respiratory ROS: negative for - cough, shortness of breath or wheezing Cardiovascular ROS: no chest pain or palpitations GI ROS: negative for nausea, vomiting, abdominal pain, diarrhea, constipation Musculoskeletal ROS: negative for - joint swelling or muscle pain Neurological ROS: negative for - confusion, syncope Dermatological ROS: negative  for pruritus and rash  PHYSICAL EXAM:  Vitals:  08/22/21 0908  BP: 131/86  Pulse: (!) 114  .  Ht:167.6 cm (5\' 6" ) Wt:57.6 kg (127 lb) VOH:YWVP surface area is 1.64 meters squared. Body mass index is 20.5 kg/m.[name]  GENERAL: Alert, active, oriented x3  HEENT: Pupils equal reactive to light. Extraocular movements are intact. Sclera clear. Palpebral conjunctiva normal red color.Pharynx clear.  NECK: Supple with no palpable mass and no adenopathy.  LUNGS: Sound clear with no rales rhonchi or wheezes.  HEART: Regular rhythm S1 and S2 without murmur.  ABDOMEN: Soft and depressible, nontender with no palpable mass, no hepatomegaly. Ileostomy and mucous fistula pink and patent.  EXTREMITIES: Well-developed well-nourished symmetrical with no dependent edema.  NEUROLOGICAL: Awake alert oriented, facial expression symmetrical, moving all extremities.   IMPRESSION:   Ileostomy status  Patient can continue she will increase output through the mucous fistula. I personally evaluated the CT scan done in January. This showed a  large bulk of stool in the rectum. I think at this stool might be causing some type of obstruction. This is causing the backing above the mucous to the mucous fistula.  Patient has been evaluated by cardiology. They feel that she is a great candidate for surgical management. At this moment patient is ready for ileostomy takedown. I discussed with her the plan of doing a minimally invasive and ileostomy takedown and connected to the transverse colon. We will coordinate colonoscopy for evaluation of the transverse descending and sigmoid colon. Patient will do Fleet enema before surgery to try to remove the old stool.  Patient oriented about the risk of surgery includes bleeding, infection, injury to intestine, fistula, leak of anastomosis, obstruction, internal infections, intracranial fistula, among others. Also patient high risk for surgery due to her history of A. fib on  anticoagulation. As per cardiology she can hold anticoagulation up to 3 days before surgery.  Patient endorses she understood and agreed to proceed with plan.   PLAN:  1. We will get you ready for your ileostomy takedown. The surgery will be at the beginning of January. Robotic assisted laparoscopic ileostomy takedown [phone]) 2. Before his surgery we will coordinate colonoscopy. 3. For the colonoscopy we will use 2 Fleet enemas. -Apply the first Fleet enema the day before the colonoscopy -Applied a second enema the morning of the colonoscopy 4. We will need to stop Eliquis 2 days before the colonoscopy and 2 days before the surgery 5. Cardiology clearance done 6. CBC, CMP 4. Contact us if you have any concern.  Patient verbalized understanding, all questions were answered, and were agreeable with the plan outlined above.   Herbert Pun, MD

## 2021-10-13 ENCOUNTER — Other Ambulatory Visit: Payer: Self-pay

## 2021-10-13 ENCOUNTER — Encounter
Admission: RE | Admit: 2021-10-13 | Discharge: 2021-10-13 | Disposition: A | Discharge status: Home / Self Care | Payer: Medicare Other | Source: Ambulatory Visit | Attending: General Surgery | Admitting: General Surgery

## 2021-10-13 DIAGNOSIS — Z01818 Encounter for other preprocedural examination: Secondary | ICD-10-CM

## 2021-10-13 HISTORY — DX: Gastro-esophageal reflux disease without esophagitis: K21.9

## 2021-10-13 HISTORY — DX: Unspecified atrial fibrillation: I48.91

## 2021-10-13 HISTORY — DX: Anemia, unspecified: D64.9

## 2021-10-13 NOTE — Patient Instructions (Addendum)
Your procedure is scheduled on:10-23-21 Monday Report to the Registration Desk on the 1st floor of the [facility].Then proceed to the 2nd floor Surgery Desk in the [facility] To find out your arrival time, please call [phone] between 1PM - 3PM on:10-20-21 Friday  REMEMBER: Instructions that are not followed completely may result in serious medical risk, up to and including death; or upon the discretion of your surgeon and anesthesiologist your surgery may need to be rescheduled.  Do not eat food after midnight the night before surgery.  No gum chewing, lozengers or hard candies.  You may however, drink CLEAR liquids up to 2 hours before you are scheduled to arrive for your surgery. Do not drink anything within 2 hours of your scheduled arrival time.  Clear liquids include: - water  - apple juice without pulp - gatorade (not RED, PURPLE, OR BLUE) - black coffee or tea (Do NOT add milk or creamers to the coffee or tea) Do NOT drink anything that is not on this list.  TAKE THESE MEDICATIONS THE MORNING OF SURGERY WITH A SIP OF WATER: -amLODipine (NORVASC)  -carvedilol (COREG)  -citalopram (CELEXA) -pantoprazole (PROTONIX)   Use your Fluticasone-Umeclidin-Vilant 100-62.5-25 MCG/INH Inhaler and your albuterol (PROVENTIL HFA;VENTOLIN HFA) 108 (90 Base) MCG/ACT inhaler the morning of surgery and bring your Albuterol inhaler to the hospital  Stop apixaban (ELIQUIS) 3 days prior to surgery as instructed by Dr Nona Dell Last dose will be on 10-19-21 Thursday  One week prior to surgery: Stop Anti-inflammatories (NSAIDS) such as Advil, Aleve, Ibuprofen, Motrin, Naproxen, Naprosyn and Aspirin based products such as Excedrin, Goodys Powder, BC Powder.You may however, continue to take Tylenol if needed for pain up until the day of surgery.  Stop ANY OVER THE COUNTER supplements/vitamins 7 days prior to surgery (CALCIUM-VITAMIN D, Multiple Vitamin (MULTIVITAMIN WITH  MINERALS)-Continue your ferrous sulfate up until the day prior to surgery  No Alcohol for 24 hours before or after surgery.  No Smoking including e-cigarettes for 24 hours prior to surgery.  No chewable tobacco products for at least 6 hours prior to surgery.  No nicotine patches on the day of surgery.  Do not use any "recreational" drugs for at least a week prior to your surgery.  Please be advised that the combination of cocaine and anesthesia may have negative outcomes, up to and including death. If you test positive for cocaine, your surgery will be cancelled.  On the morning of surgery brush your teeth with toothpaste and water, you may rinse your mouth with mouthwash if you wish. Do not swallow any toothpaste or mouthwash.  Use CHG wipes as directed on instruction sheet.  Do not wear jewelry, make-up, hairpins, clips or nail polish.  Do not wear lotions, powders, or perfumes.   Do not shave body from the neck down 48 hours prior to surgery just in case you cut yourself which could leave a site for infection.  Also, freshly shaved skin may become irritated if using the CHG soap.  Contact lenses, hearing aids and dentures may not be worn into surgery.  Do not bring valuables to the hospital. [facility] is not responsible for any missing/lost belongings or valuables.   Notify your doctor if there is any change in your medical condition (cold, fever, infection).  Wear comfortable clothing (specific to your surgery type) to the hospital.  After surgery, you can help prevent lung complications by doing breathing exercises.  Take deep breaths and cough every 1-2 hours. Your  doctor may order a device called an Incentive Spirometer to help you take deep breaths. When coughing or sneezing, hold a pillow firmly against your incision with both hands. This is called splinting. Doing this helps protect your incision. It also decreases belly discomfort.  If you are being admitted to the  hospital overnight, leave your suitcase in the car. After surgery it may be brought to your room.  If you are being discharged the day of surgery, you will not be allowed to drive home. You will need a responsible adult (18 years or older) to drive you home and stay with you that night.   If you are taking public transportation, you will need to have a responsible adult (18 years or older) with you. Please confirm with your physician that it is acceptable to use public transportation.   Please call the [facility]. at [phone] if you have any questions about these instructions.  Surgery Visitation Policy:  Patients undergoing a surgery or procedure may have one family member or support person with them as long as that person is not COVID-19 positive or experiencing its symptoms.  That person may remain in the waiting area during the procedure and may rotate out with other people.  Inpatient Visitation:    Visiting hours are 7 a.m. to 8 p.m. Up to two visitors ages 16+ are allowed at one time in a patient room. The visitors may rotate out with other people during the day. Visitors must check out when they leave, or other visitors will not be allowed. One designated support person may remain overnight. The visitor must pass COVID-19 screenings, use hand sanitizer when entering and exiting the patients room and wear a mask at all times, including in the patients room. Patients must also wear a mask when staff or their visitor are in the room. Masking is required regardless of vaccination status.

## 2021-10-17 ENCOUNTER — Encounter: Payer: Self-pay | Admitting: General Surgery

## 2021-10-17 ENCOUNTER — Encounter: Payer: Self-pay | Admitting: Internal Medicine

## 2021-10-17 NOTE — Progress Notes (Signed)
Perioperative Services  Pre-Admission/Anesthesia Testing Clinical Review  Date: 10/17/21  Patient Demographics:  Name: Danielle [name] DOB:   1956-09-23 MRN:   [id number]  Planned Surgical Procedure(s):    Case: [id number] Date/Time: 10/23/21 0715   Procedure: XI ROBOTIC ASSISTED ILEOSTOMY TAKEDOWN   Anesthesia type: General   Pre-op diagnosis: Z93.2 Ileostomy status   Location: [facility] OR ROOM 06 / [place] [facility] AN[facility]   Surgeons: Herbert Pun, MD   NOTE: Available PAT nursing documentation and vital signs have been reviewed. Clinical nursing staff has updated patient's PMH/PSHx, current medication list, and drug allergies/intolerances to ensure comprehensive history available to assist in medical decision making as it pertains to the aforementioned surgical procedure and anticipated anesthetic course. Extensive review of available clinical information performed. [place] PMH and PSHx updated with any diagnoses/procedures that  may have been inadvertently omitted during her intake with the pre-admission testing department's nursing staff.  Clinical Discussion:  Danielle [name] is a 66 y.o. female who is submitted for pre-surgical anesthesia review and clearance prior to her undergoing the above procedure. Patient is a Former Smoker (10 pack years; quit 06/2012). Pertinent PMH includes: atrial fibrillation, diastolic dysfunction, aortic valve stenosis, aortic atherosclerosis, cardiac murmur, HTN, HLD, COPD (on supplemental oxygen), CKD-IV, GERD (on daily PPI), anemia, lymphedema, history of LEFT breast cancer, history of RIGHT lung cancer, anxiety, depression.  Patient is followed by cardiology [name], MD). She was last seen in the cardiology clinic on 08/08/2019; notes reviewed.  At the time of her clinic visit, patient doing well overall from a cardiovascular perspective.  She denied any chest pain, however had chronic dyspnea both at rest and with exertion related to her  underlying COPD diagnosis.  Patient on supplemental oxygen via nasal cannula.  She denied any PND, orthopnea, significant peripheral edema, vertiginous symptoms, or presyncope/syncope.  Patient with reports of intermittent palpitations.  PMH significant for cardiovascular diagnoses.  TTE performed on 02/10/2017 revealed a normal left ventricular systolic function with an EF of 55 to 60%.  Diastolic Doppler parameters consistent with pseudonormalization/increased filling pressures (G2DD).  PASP mildly elevated with a peak pressure of 46 mmHg.  There was mild aortic valve stenosis with a mean pressure gradient of 12 mmHg.  Repeat TTE performed on 04/21/2018 revealed a moderately reduced left ventricular systolic function with EF of 35-40%.  There was mild aortic valve stenosis with a mean pressure gradient of 8 mmHg.  Left atrium was mildly dilated.  There was moderate mitral valve regurgitation.  TTE performed on 11/07/2019 revealed normal left ventricular systolic function with an EF of 50-55%.  Diastolic Doppler parameters consistent with impaired relaxation (G1DD).  There was no evidence of significant valvular regurgitation.  Aortic valve stenosis persistent with a mean pressure gradient of 10 mmHg; AVA (VTI) = 1.14 cm.  Patient went in for a LAA occlusive implant (Watchman's device) on 01/11/2021.  Procedure was complicated by difficulties and deployed the device including multiple transseptal sticks and multiple sheet shapes without acceptable parameters, therefore the device was withdrawn and the procedure was aborted.  PMH significant for atrial fibrillation; CHA2DS2-VASc Score = 4 (age, sex, HTN, aortic plaque).  Rate and rhythm maintained on oral carvedilol.  Patient chronically anticoagulated on dose reduced apixaban; compliant with therapy with no evidence or reports of GI bleeding.  Patient is not diabetic. Functional capacity, as defined by DASI, is documented as being >/= 4 METS.  No changes  were made to her medication regimen.  Patient to follow-up  with outpatient cardiology in 6 months or sooner if needed.  [name] is scheduled for an ROBOT ASSISTED ILEOSTOMY TAKEDOWN on 10/23/2021 with Dr. Herbert Pun, MD.  Given patient's past medical history significant for cardiovascular diagnoses, presurgical cardiac clearance was sought by the PAT team. "The patient is at the lowest risk possible for perioperative cardiovascular complications with the planned procedure.  The overall risk her procedure is low (<1%).  Currently has no evidence active and/or significant angina and/or congestive heart failure. Patient may proceed to surgery without restriction or need for further cardiovascular testing and an overall LOW risk".  Again, this patient is on daily anticoagulation therapy.  She has been instructed on recommendations for holding her apixaban for 3 days prior to her procedure with plans to restart since postoperative bleeding risk felt to be minimized by primary attending surgeon.  Patient is aware that her last dose of apixaban should be on 10/19/2021.    Patient denies previous perioperative complications with anesthesia in the past. In review of the available records, it is noted that patient underwent a general anesthetic course here (ASA III) in 09/2021 without documented complications.   Vitals with BMI 10/04/2021 09/21/2021 09/21/2021  Height - - -  Weight - - -  BMI - - -  Systolic 833 93 93  Diastolic 74 75 75  Pulse 825 - -    Providers/Specialists:   NOTE: Primary physician provider listed below. Patient may have been seen by APP or partner within same practice.   PROVIDER ROLE / SPECIALTY LAST Tanna Savoy, MD General Surgery 10/04/2021  Center, [facility] Primary Care Provider ???  Serafina Royals, MD Cardiology 08/07/2021  Anthonette Legato, MD Nephrology 09/04/2021  Charlaine Dalton, MD Oncology 07/27/2021   Allergies:   Patient has no known allergies.  Current Home Medications:   No current facility-administered medications for this encounter.    acetaminophen (TYLENOL) 325 MG tablet   albuterol (PROVENTIL HFA;VENTOLIN HFA) 108 (90 Base) MCG/ACT inhaler   amLODipine (NORVASC) 5 MG tablet   apixaban (ELIQUIS) 2.5 MG TABS tablet   calcium carbonate (TUMS - DOSED IN MG ELEMENTAL CALCIUM) 500 MG chewable tablet   CALCIUM-VITAMIN D PO   carvedilol (COREG) 6.25 MG tablet   citalopram (CELEXA) 40 MG tablet   ferrous sulfate 325 (65 FE) MG tablet   Fluticasone-Umeclidin-Vilant 100-62.5-25 MCG/INH AEPB   loperamide (IMODIUM) 2 MG capsule   Multiple Vitamin (MULTIVITAMIN WITH MINERALS) TABS tablet   OXYGEN   pantoprazole (PROTONIX) 40 MG tablet   sucralfate (CARAFATE) 1 GM/10ML suspension   torsemide (DEMADEX) 20 MG tablet   zolpidem (AMBIEN) 5 MG tablet   Nutritional Supplements (FEEDING SUPPLEMENT, NEPRO CARB STEADY,) LIQD   predniSONE (DELTASONE) 10 MG tablet   sodium bicarbonate 650 MG tablet   History:   Past Medical History:  Diagnosis Date   Anemia    Anxiety    Aortic atherosclerosis (HCC)    Aortic valve stenosis 02/10/2018   a.) TTE 02/10/2018: EF 55-60%: mild AS with MPG of 12 mmHg. b.) TTE 04/21/2018: EF 35-40%; mild AS with MPG 8 mmHg. c.) TTE 11/07/2019: EF 50-55%; mild AS with MPG 10 mmHg.   Arthritis    Atrial fibrillation (HCC)    a.) CHA2DS2-VASc = 4 (age, sex, HTN, aortic plaque). b.) Rate/rhythm maintained on oral carvedilol; chronically anticoagulated on dose reduced apixaban. c.)  Attempted deployment of LAA occlusive device on 01/11/2021; parameters failed and procedure aborted.   Breast cancer, left ([place])  2000   a.) T2N1M0; ER/PR (+) --> Tx'd with total mastectomy, LN resection, XRT, and chemotherapy   Cancer of right lung ([place]) 07/30/2016   a.) adenocarcinoma; ALK, ROS1, PDL1, BRAF, EGFR all negative.   CKD (chronic kidney disease), stage IV (HCC)    COPD (chronic  obstructive pulmonary disease) (HCC)    Dependence on supplemental oxygen    Depression    Diastolic dysfunction 41/66/0630   a.) TTE 02/10/2017: EF 55-60%; G2DD. b.) TTE 04/21/2018: EF 35-40%; mild LA dilation; mod MV regurgitation. c.) TTE 11/07/2019: EF 50-55%; G1DD.   DOE (dyspnea on exertion)    GERD (gastroesophageal reflux disease)    Heart murmur    History of 2019 novel coronavirus disease (COVID-19) 10/14/2019   HLD (hyperlipidemia)    Hypertension    Long term current use of anticoagulant    a.) apixaban   Lymphedema    Personal history of chemotherapy    Personal history of radiation therapy    SBO (small bowel obstruction) ([place]) 11/05/2020   Vitamin D deficiency    Past Surgical History:  Procedure Laterality Date   Breast Biospy Left    [facility]   BREAST SURGERY     COLONOSCOPY N/A 04/30/2018   Procedure: COLONOSCOPY;  Surgeon: Virgel Manifold, MD;  Location: [facility] ENDOSCOPY;  Service: Endoscopy;  Laterality: N/A;   COLONOSCOPY N/A 07/22/2018   Procedure: COLONOSCOPY;  Surgeon: Virgel Manifold, MD;  Location: [facility] ENDOSCOPY;  Service: Endoscopy;  Laterality: N/A;   COLONOSCOPY WITH PROPOFOL N/A 09/21/2021   Procedure: COLONOSCOPY WITH PROPOFOL;  Surgeon: Benjamine Sprague, DO;  Location: [facility] ENDOSCOPY;  Service: General;  Laterality: N/A;   DILATION AND CURETTAGE OF UTERUS     ELECTROMAGNETIC NAVIGATION BROCHOSCOPY Right 04/11/2016   Procedure: ELECTROMAGNETIC NAVIGATION BRONCHOSCOPY;  Surgeon: Vilinda Boehringer, MD;  Location: [facility] ORS;  Service: Cardiopulmonary;  Laterality: Right;   ESOPHAGOGASTRODUODENOSCOPY N/A 07/22/2018   Procedure: ESOPHAGOGASTRODUODENOSCOPY (EGD);  Surgeon: Virgel Manifold, MD;  Location: [facility] ENDOSCOPY;  Service: Endoscopy;  Laterality: N/A;   ESOPHAGOGASTRODUODENOSCOPY (EGD) WITH PROPOFOL N/A 05/07/2018   Procedure: ESOPHAGOGASTRODUODENOSCOPY (EGD) WITH PROPOFOL;  Surgeon: Lucilla Lame, MD;  Location: [facility] ENDOSCOPY;  Service: Endoscopy;   Laterality: N/A;   ESOPHAGOGASTRODUODENOSCOPY (EGD) WITH PROPOFOL N/A 04/24/2019   Procedure: ESOPHAGOGASTRODUODENOSCOPY (EGD) WITH PROPOFOL;  Surgeon: Jonathon Bellows, MD;  Location: [facility] ENDOSCOPY;  Service: Gastroenterology;  Laterality: N/A;   ESOPHAGOGASTRODUODENOSCOPY (EGD) WITH PROPOFOL N/A 01/12/2020   Procedure: ESOPHAGOGASTRODUODENOSCOPY (EGD) WITH PROPOFOL;  Surgeon: Jonathon Bellows, MD;  Location: [facility] ENDOSCOPY;  Service: Gastroenterology;  Laterality: N/A;   ESOPHAGOGASTRODUODENOSCOPY (EGD) WITH PROPOFOL N/A 04/28/2020   Procedure: ESOPHAGOGASTRODUODENOSCOPY (EGD) WITH PROPOFOL;  Surgeon: Jonathon Bellows, MD;  Location: [facility] ENDOSCOPY;  Service: Gastroenterology;  Laterality: N/A;   EUS N/A 05/07/2019   Procedure: FULL UPPER ENDOSCOPIC ULTRASOUND (EUS) RADIAL;  Surgeon: Jola Schmidt, MD;  Location: [facility] ENDOSCOPY;  Service: Endoscopy;  Laterality: N/A;   ILEOSCOPY N/A 07/22/2018   Procedure: ILEOSCOPY THROUGH STOMA;  Surgeon: Virgel Manifold, MD;  Location: [facility] ENDOSCOPY;  Service: Endoscopy;  Laterality: N/A;   ILEOSTOMY     ILEOSTOMY N/A 09/08/2018   Procedure: ILEOSTOMY REVISION POSSIBLE CREATION;  Surgeon: Herbert Pun, MD;  Location: [facility] ORS;  Service: General;  Laterality: N/A;   ILEOSTOMY CLOSURE N/A 08/15/2018   Procedure: DILATION OF ILEOSTOMY STRICTURE;  Surgeon: Herbert Pun, MD;  Location: [facility] ORS;  Service: General;  Laterality: N/A;   LAPAROTOMY Right 05/04/2018   Procedure: EXPLORATORY LAPAROTOMY right colectomy right and left ostomy;  Surgeon: Herbert Pun, MD;  Location: [facility] ORS;  Service: General;  Laterality: Right;   LEFT ATRIAL APPENDAGE OCCLUSION N/A 01/11/2021   Procedure: LEFT ATRIAL APPENDAGE OCCLUSION ([place]); ABORTED PROCEDURE WITHOUT DEVICE BEING IMPLANTED; Location: [facility]; Surgeon: Mylinda Latina, MD   LUNG BIOPSY     MASTECTOMY Left    2000, [place]   ROTATOR CUFF REPAIR Right    [place]   Family History  Problem  Relation Age of Onset   Breast cancer Mother 75   Cancer Mother        Breast    Cirrhosis Father    Breast cancer Paternal Aunt 83   Cancer Maternal Aunt        Breast    Social History   Tobacco Use   Smoking status: Former    Packs/day: 0.50    Years: 20.00    Pack years: 10.00    Types: Cigarettes    Quit date: 07/02/2012    Years since quitting: 9.2   Smokeless tobacco: Current    Types: Snuff   Tobacco comments:    quit 2014  Vaping Use   Vaping Use: Never used  Substance Use Topics   Alcohol use: Yes    Comment: Occasionally beer   Drug use: No    Pertinent Clinical Results:  LABS: Labs reviewed: Acceptable for surgery.   Ref Range & Units 09/04/2021  WBC 3.8 - 10.8 Thousand/uL 6.7   RBC 3.80 - 5.10 Million/uL 3.77 Low    Hemoglobin 11.7 - 15.5 g/dL 11.6 Low    Hematocrit 35.0 - 45.0 % 38.0   MCV 80.0 - 100.0 fL 100.8 High    MCH 27.0 - 33.0 pg 30.8   MCHC 32.0 - 36.0 g/dL 30.5 Low    RDW 11.0 - 15.0 % 13.2   Platelets 140 - 400 Thousand/uL 271   MPV 7.5 - 12.5 fL 10.2   Neutrophils Absolute 1500 - 7800 cells/uL 4,543   Lymphocytes Absolute 850 - 3900 cells/uL 1,528   Monocytes Absolute 200 - 950 cells/uL 395   Eosinophils Absolute 15 - 500 cells/uL 141   Basophils Absolute 0 - 200 cells/uL 94   Neutrophils Relative % 67.8   Lymphocytes % 22.8   Monocytes % 5.9   Eosinophils % 2.1   Basophils Relative % 1.4   Resulting Agency  [facility]    Ref Range & Units 09/26/2021  Glucose 65 - 99 mg/dL 70   BUN 7 - 25 mg/dL 30 High    Creatinine 0.50 - 1.05 mg/dL 2.05 High    eGFR CKD-EPI CR 2021 > OR = 60 mL/min/1.58m2 26 Low    BUN/Creatinine Ratio 6 - 22 (calc) 15   Sodium 135 - 146 mmol/L 138   Potassium 3.5 - 5.3 mmol/L 5.0   Chloride 98 - 110 mmol/L 111 High    Bicarbonate (CO2) 20 - 32 mmol/L 17 Low    Calcium 8.6 - 10.4 mg/dL 9.5   Phosphorus 2.1 - 4.3 mg/dL 3.5   Albumin 3.6 - 5.1 g/dL 4.4   Resulting Agency  [facility]    ECG: Date:  08/07/2021 Rate: 87 bpm Rhythm:  Sinus rhythm with marked sinus arrhythmia Axis (leads I and aVF): Normal Intervals: PR 188 ms. QRS 92 ms. QTc 505 ms. ST segment and T wave changes: No evidence of acute ST segment elevation or depression.  Evidence of an age undetermined septal infarct present Comparison: Similar to previous tracing obtained on 04/04/2021 NOTE: Tracing obtained  at [facility]; unable for review. Above based on cardiologist's interpretation.    IMAGING / PROCEDURES: TRANSESOPHAGEAL ECHOCARDIOGRAM performed on 01/10/2021 Left atrial appendage without evidence of thrombus Mild MR and mild TR Stenotic appearing aortic valve with restricted movement  Small right sided pleural effusion   TRANSTHORACIC ECHOCARDIOGRAM performed on 08/23/2020 LVEF 50-55% normal left ventricular systolic function with mild LVH Normal right ventricular systolic function Trivial AR Mild MR and TR No PR Mild aortic valve stenosis with a mean pressure gradient of 9.0 mmHg; AVA (VTI) = 1.18 cm No pericardial effusion   Impression and Plan:  Danielle [name] has been referred for pre-anesthesia review and clearance prior to her undergoing the planned anesthetic and procedural courses. Available labs, pertinent testing, and imaging results were personally reviewed by me. This patient has been appropriately cleared by cardiology with an overall LOW risk of significant perioperative cardiovascular complications.  Based on clinical review performed today (10/17/21), barring any significant acute changes in the patient's overall condition, it is anticipated that she will be able to proceed with the planned surgical intervention. Any acute changes in clinical condition may necessitate her procedure being postponed and/or cancelled. Patient will meet with anesthesia team (MD and/or CRNA) on the day of her procedure for preoperative evaluation/assessment. Questions regarding anesthetic course will be  fielded at that time.   Pre-surgical instructions were reviewed with the patient during her PAT appointment and questions were fielded by PAT clinical staff. Patient was advised that if any questions or concerns arise prior to her procedure then she should return a call to PAT and/or her surgeon's office to discuss.  Honor Loh, MSN, APRN, FNP-C, CEN [facility]  Peri-operative Services Nurse Practitioner Phone: [phone] Fax: [phone] 10/17/21 12:41 PM  NOTE: This note has been prepared using Dragon dictation software. Despite my best ability to proofread, there is always the potential that unintentional transcriptional errors may still occur from this process.

## 2021-10-19 ENCOUNTER — Other Ambulatory Visit
Admission: RE | Admit: 2021-10-19 | Discharge: 2021-10-19 | Disposition: A | Discharge status: Home / Self Care | Payer: Medicare Other | Source: Ambulatory Visit | Attending: General Surgery | Admitting: General Surgery

## 2021-10-19 ENCOUNTER — Other Ambulatory Visit: Payer: Self-pay

## 2021-10-19 ENCOUNTER — Other Ambulatory Visit: Payer: Medicare Other

## 2021-10-19 DIAGNOSIS — Z01812 Encounter for preprocedural laboratory examination: Secondary | ICD-10-CM | POA: Diagnosis present

## 2021-10-19 DIAGNOSIS — Z01818 Encounter for other preprocedural examination: Secondary | ICD-10-CM

## 2021-10-19 DIAGNOSIS — Z20822 Contact with and (suspected) exposure to covid-19: Secondary | ICD-10-CM | POA: Diagnosis not present

## 2021-10-19 LAB — SARS CORONAVIRUS 2 (TAT 6-24 HRS): SARS Coronavirus 2: NEGATIVE

## 2021-10-21 IMAGING — CR DG RIBS W/ CHEST 3+V*L*
1 series · 5 of 5 positions shown · non-contrast
Comparison: None.

CLINICAL DATA: LEFT chest and rib pain following fall today.
Initial encounter.

EXAM:
LEFT RIBS AND CHEST - 3+ VIEW

[Series 1: dg ribs unilateral w/chest left · 0.14mm/px · 5 of 5 slices shown]
[im 1/5]
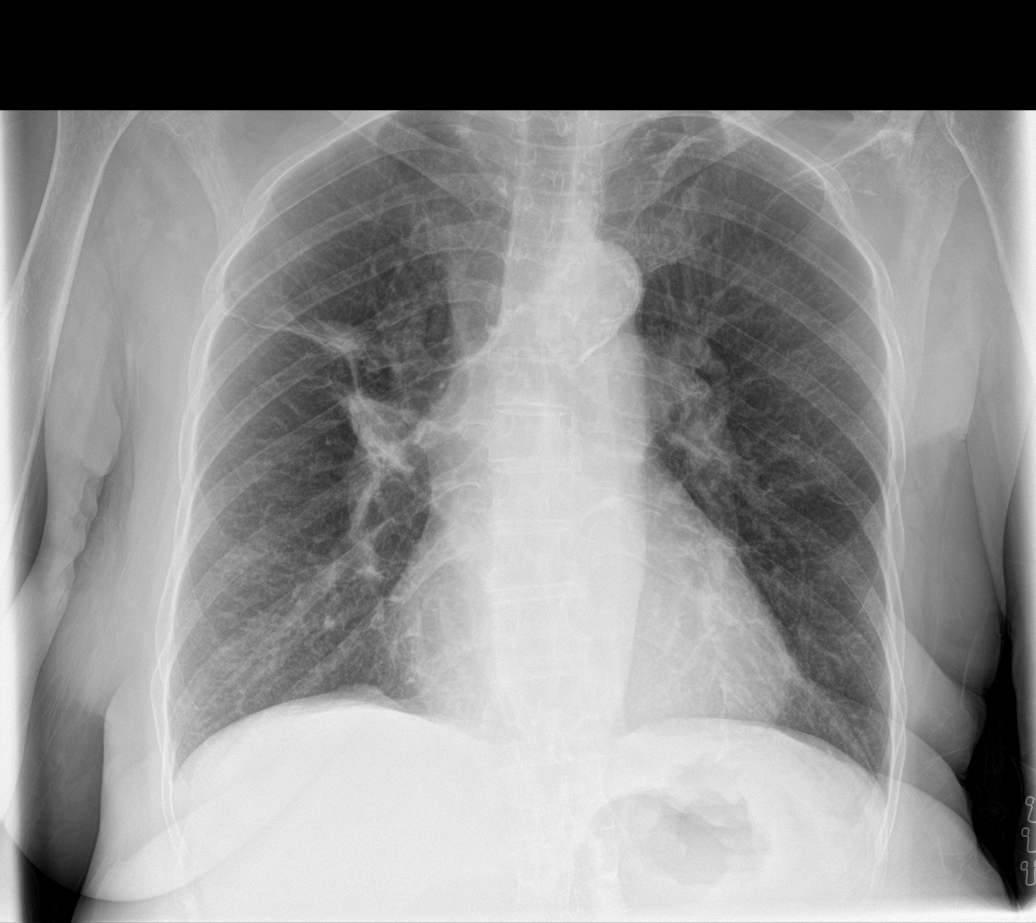
[im 2/5]
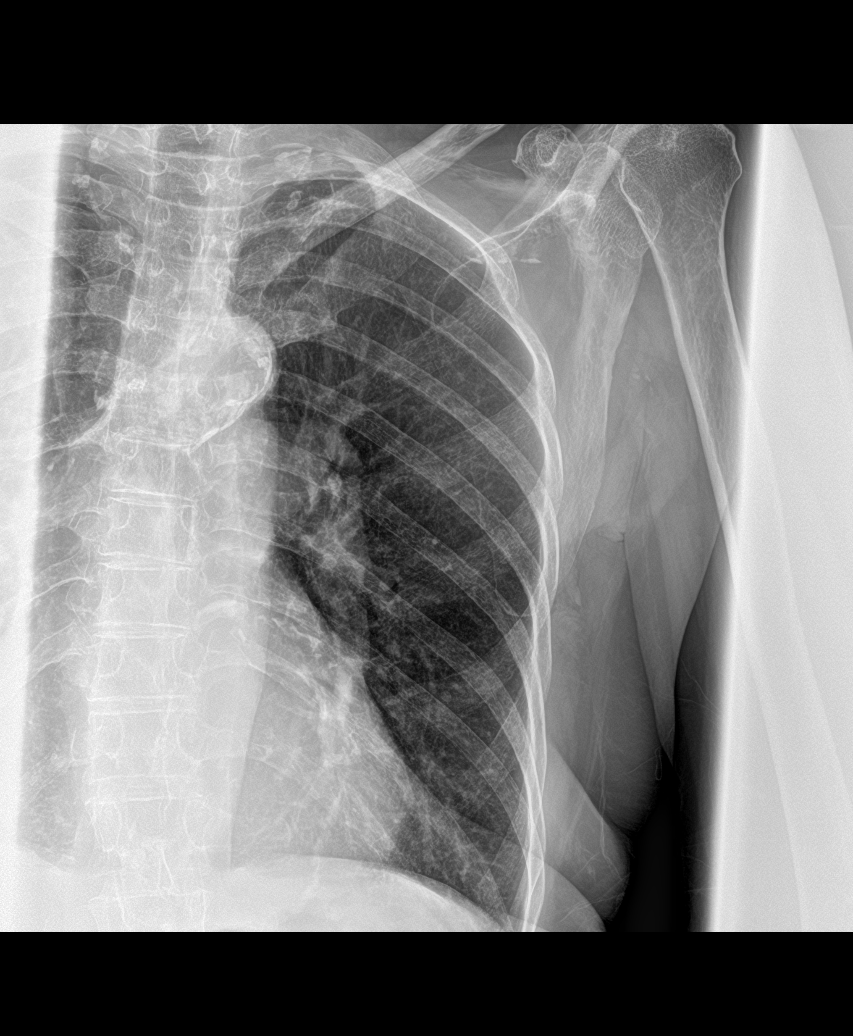
[im 3/5]
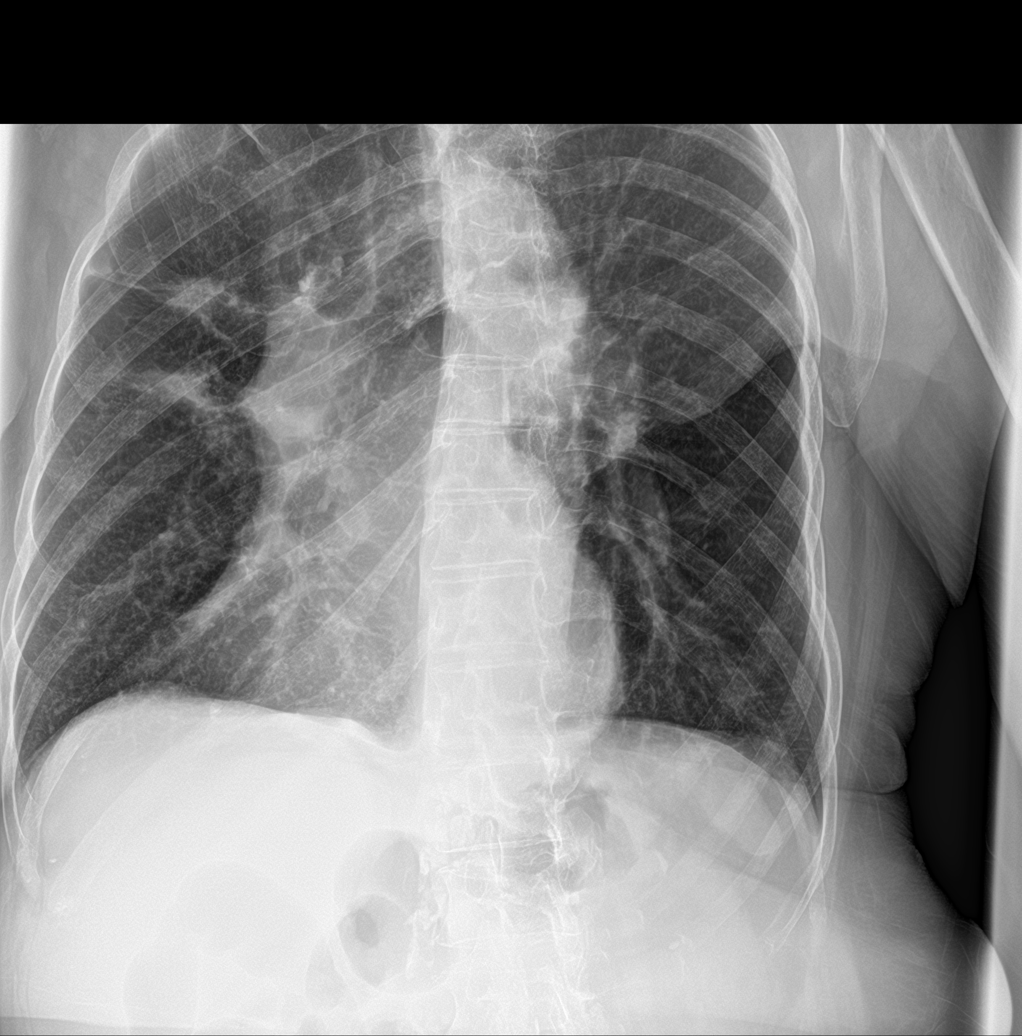
[im 4/5]
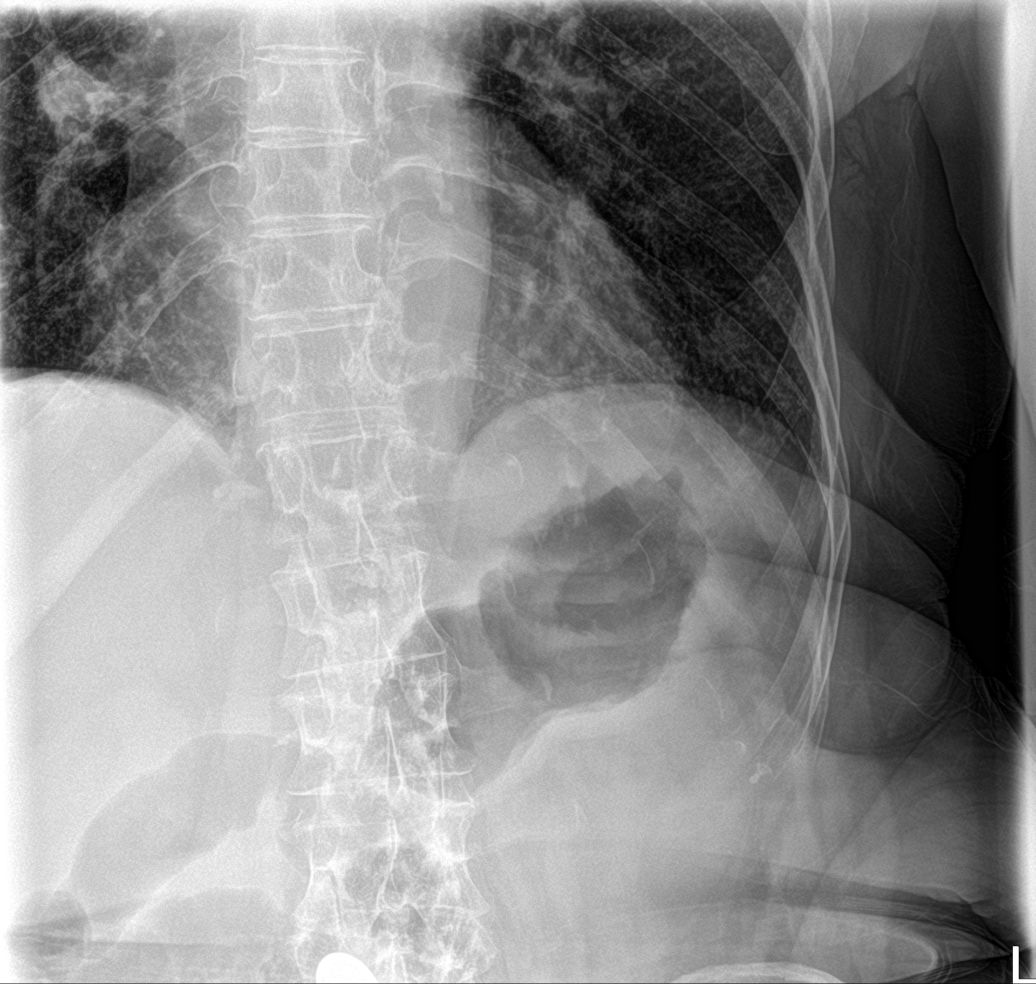
[im 5/5]
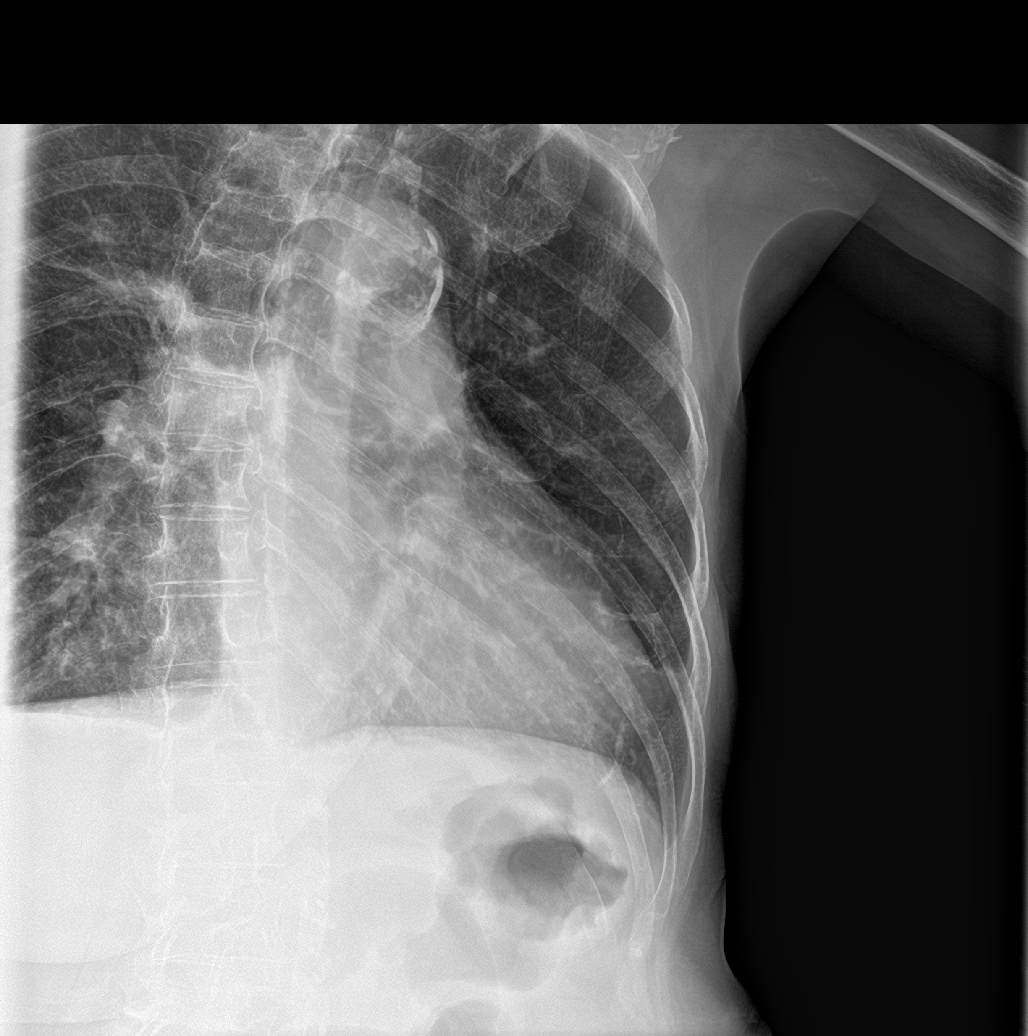

[5 of 5 positions shown; findings below may reference images not displayed]

FINDINGS: No fracture or other bone lesions are seen involving the ribs. There
is no evidence of pneumothorax or pleural effusion.

RIGHT UPPER lung opacity/scarring again noted.

Heart size and mediastinal contours are within normal limits.
IMPRESSION: 1. No acute abnormality. No rib fracture.
2. RIGHT UPPER lung opacity/scarring.

## 2021-10-21 IMAGING — CT CT CERVICAL SPINE W/O CM
2 of 3 series · 7 of 27 positions shown, 9 images · non-contrast
Comparison: January 20, 2020

CLINICAL DATA: Head trauma.  Mechanical fall.

EXAM:
CT HEAD WITHOUT CONTRAST
CT CERVICAL SPINE WITHOUT CONTRAST
TECHNIQUE: Multidetector CT imaging of the head and cervical spine was
performed following the standard protocol without intravenous
contrast. Multiplanar CT image reconstructions of the cervical spine
were also generated.

[Series 6: sagittal bone · sagittal · 0.19mm/px · 5 of 60 slices shown, 6 images]
[im 20/60  bone]
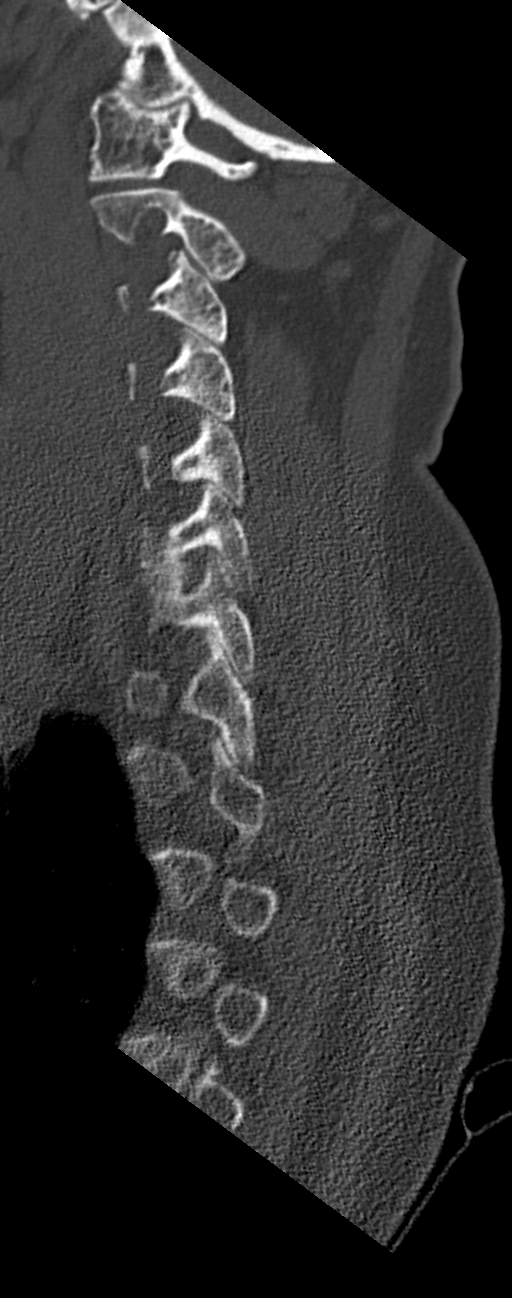
[im 25/60  bone]
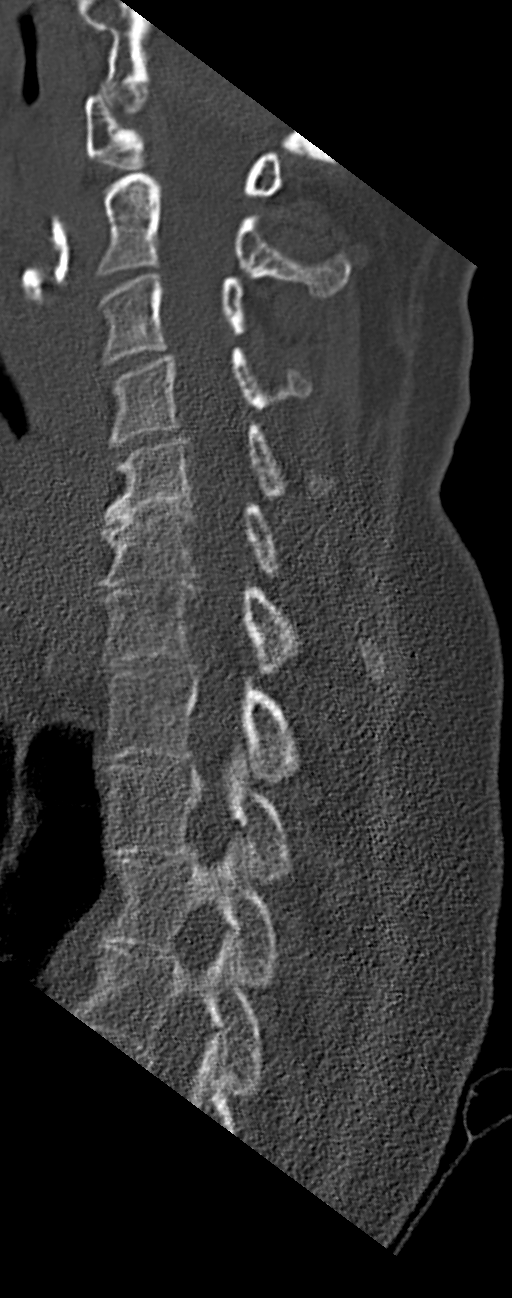
[im 30/60  soft-tissue]
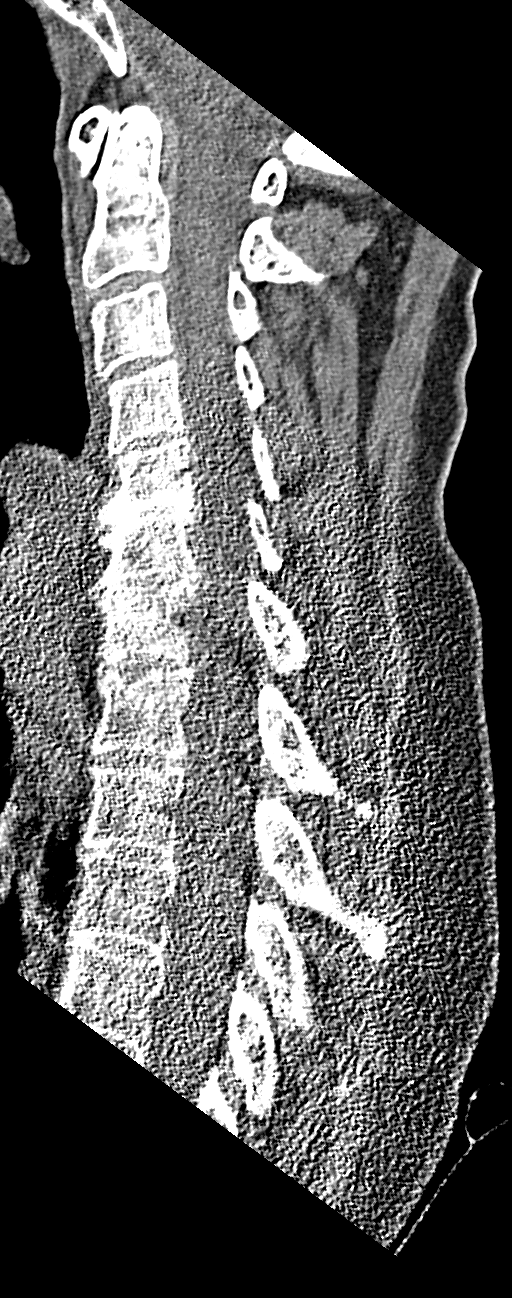
[im 30/60  bone]
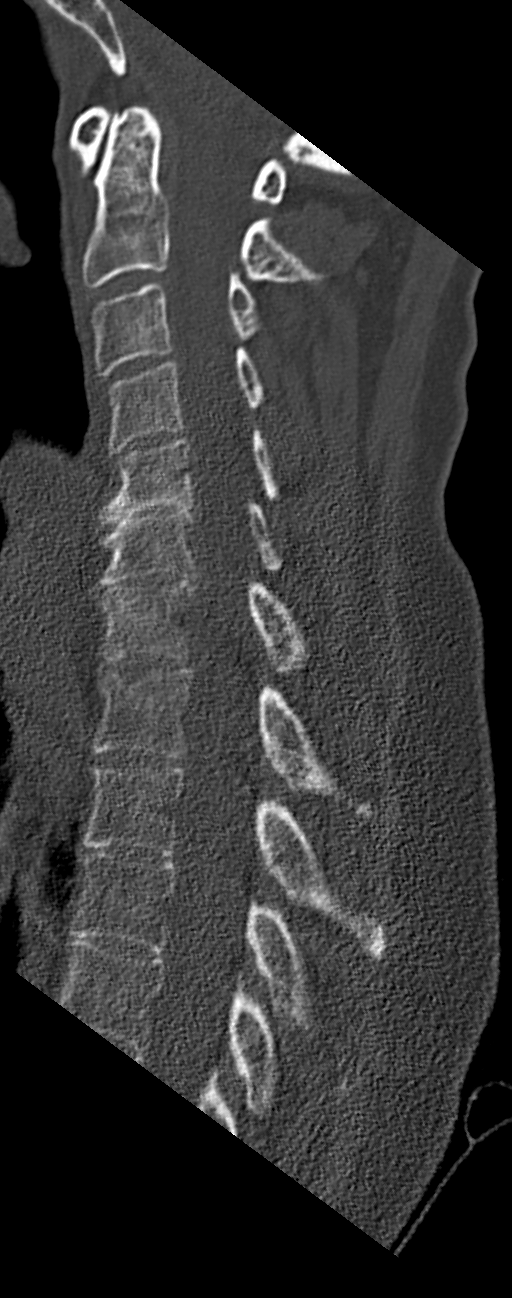
[im 35/60  bone]
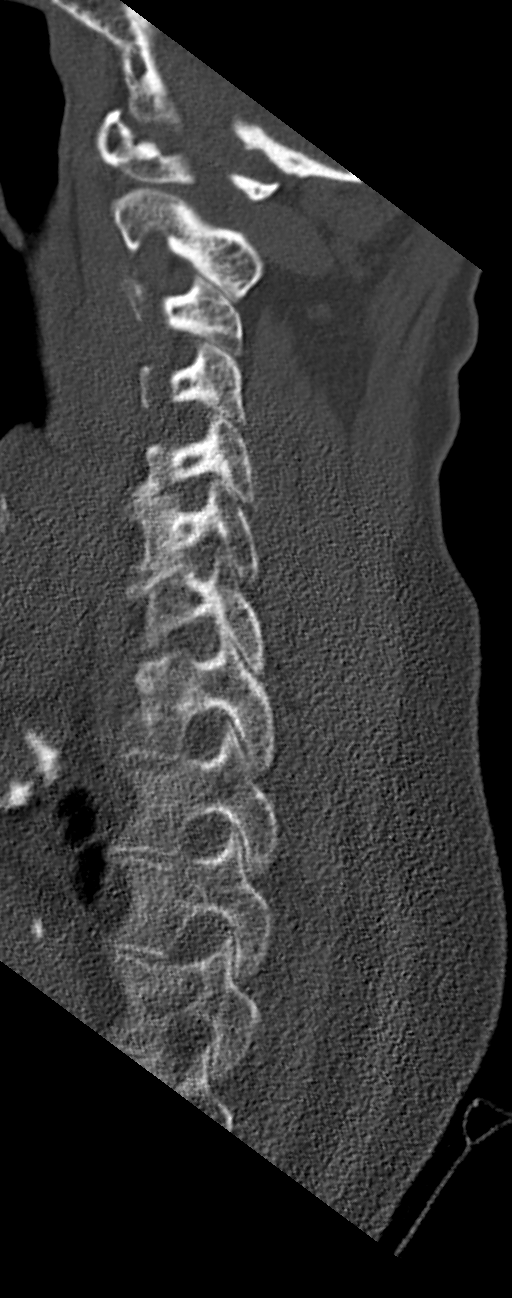
[im 40/60  bone]
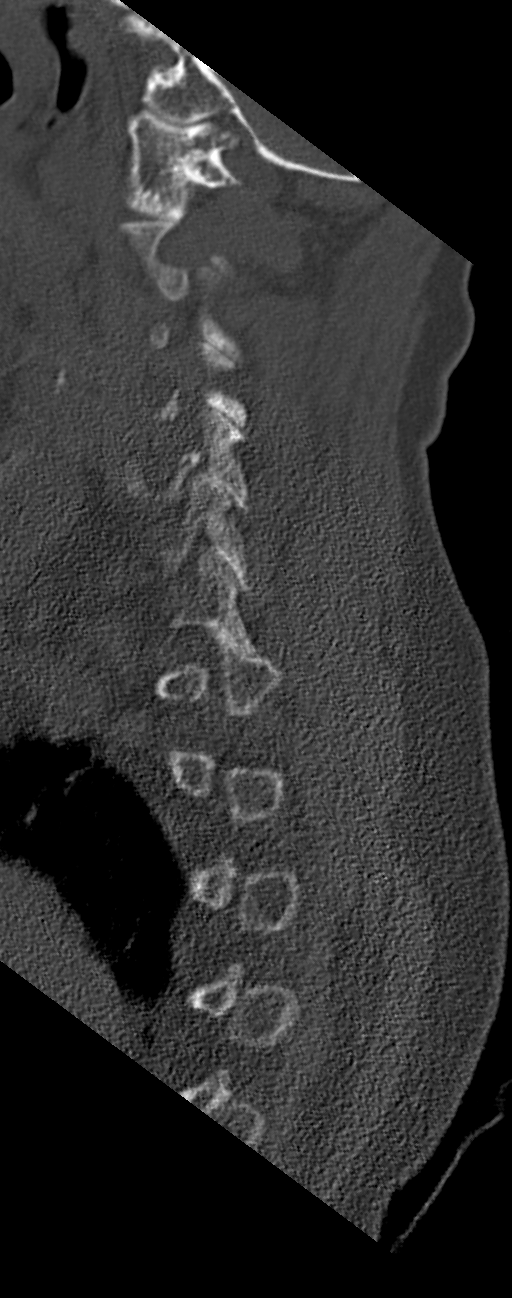

[Series 8: orthogonal bone · axial · 0.19mm/px · z∈[+275,+375]mm · 2 of 124 slices shown, 3 images]
[im 31/124  soft-tissue]
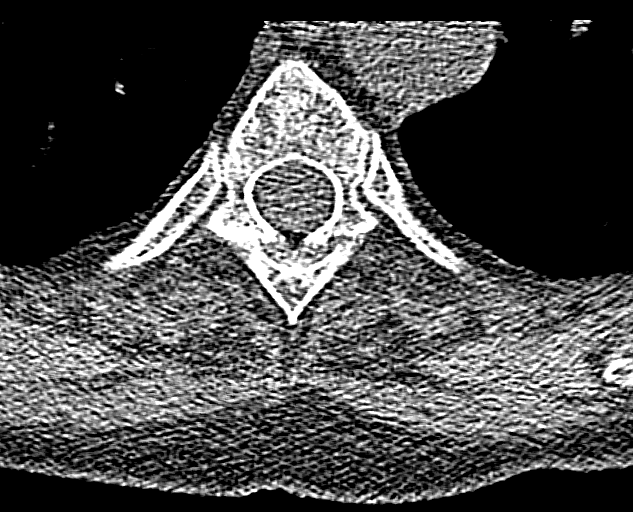
[im 31/124  bone]
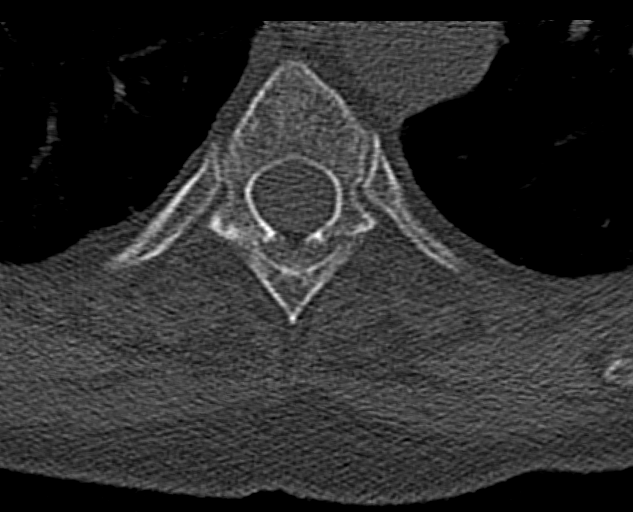
[im 93/124  bone]
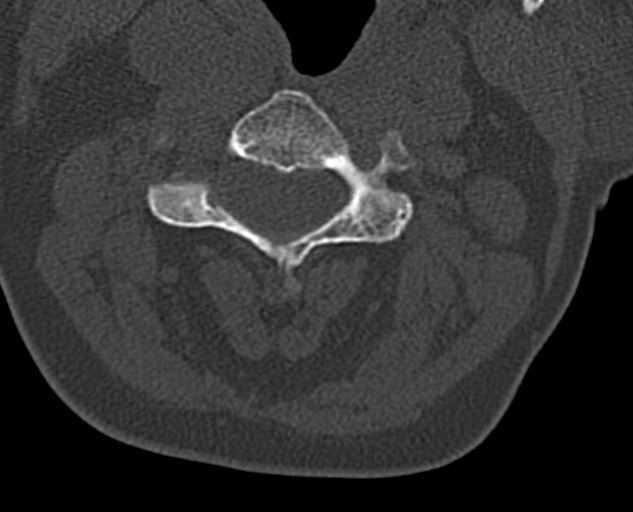

[7 of 27 positions shown; findings below may reference images not displayed]

FINDINGS: CT HEAD FINDINGS

Brain: No evidence of acute infarction, hemorrhage, hydrocephalus,
extra-axial collection or mass lesion/mass effect.

Vascular: No hyperdense vessel or unexpected calcification.

Skull: Normal. Negative for fracture or focal lesion.

Sinuses/Orbits: Visualized paranasal sinuses and orbits are
unremarkable.

Other: None.

CT CERVICAL SPINE FINDINGS

Alignment: Mild reversal of normal cervical lordosis likely related
to patient position

Skull base and vertebrae: No acute fracture. No primary bone lesion
or focal pathologic process.

Soft tissues and spinal canal: No prevertebral fluid or swelling. No
visible canal hematoma.

Disc levels: Spinal degenerative changes in the cervical spine
greatest at C5-6 and C6-7. Disc space narrowing, small osteophytes
and uncovertebral degenerative changes at these levels.

Upper chest: Paraseptal and centrilobular emphysema with apical
scarring worse on the LEFT.

Other: Aortic atherosclerosis and atherosclerotic changes extending
into branch vessels at the thoracic inlet.
IMPRESSION: 1. No acute intracranial abnormality.
2. No evidence for acute fracture or subluxation of the cervical
spine.
3. Spinal degenerative changes in the cervical spine greatest at
C5-6 and C6-7.
4. Emphysema and aortic atherosclerosis.

Aortic Atherosclerosis (WARH4-J7U.U) and Emphysema (WARH4-GVR.Y).

## 2021-10-21 IMAGING — CR DG FINGER MIDDLE 2+V*R*
1 series · 3 of 3 positions shown · non-contrast
Comparison: None.

CLINICAL DATA: Pain distal aspect third digit, tripped and fell

EXAM:
RIGHT MIDDLE FINGER 2+V

[Series 1: dg finger middle right · 0.14mm/px · 3 of 3 slices shown]
[im 1/3]
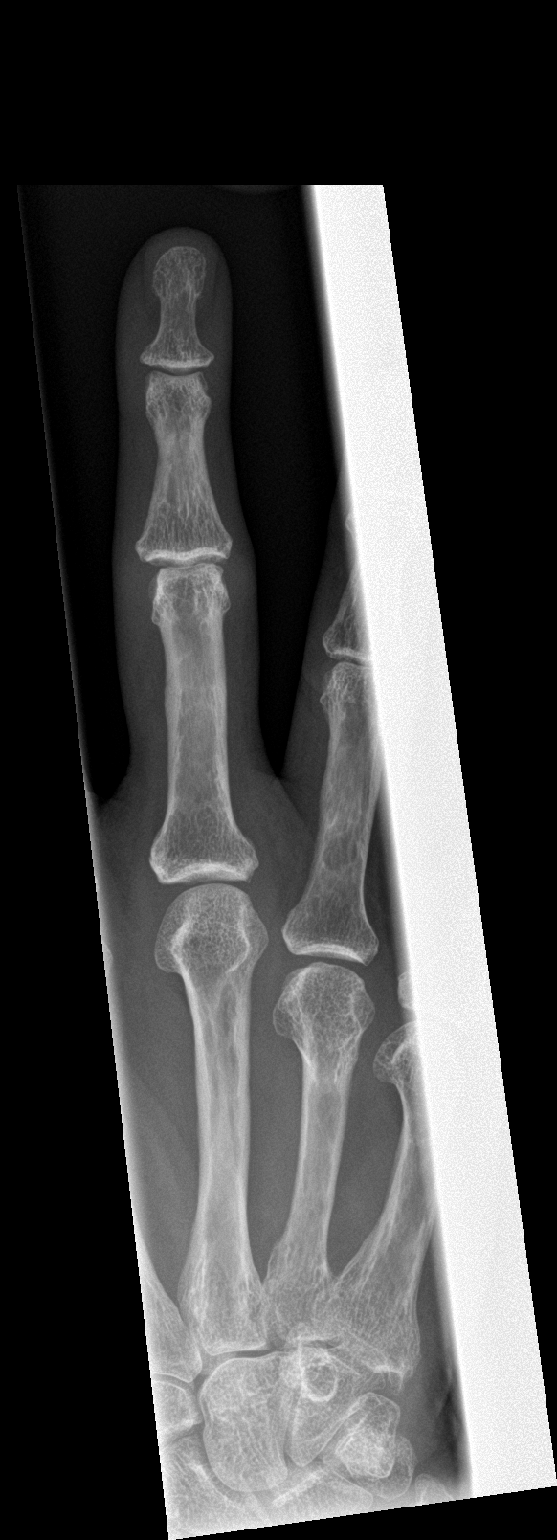
[im 2/3]
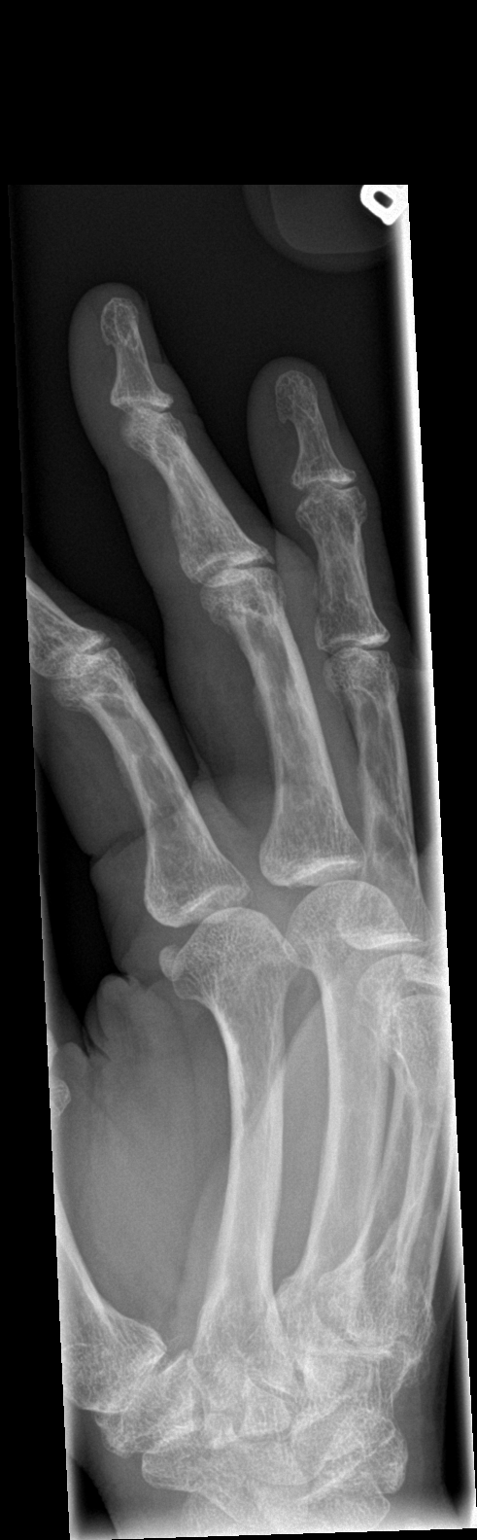
[im 3/3]
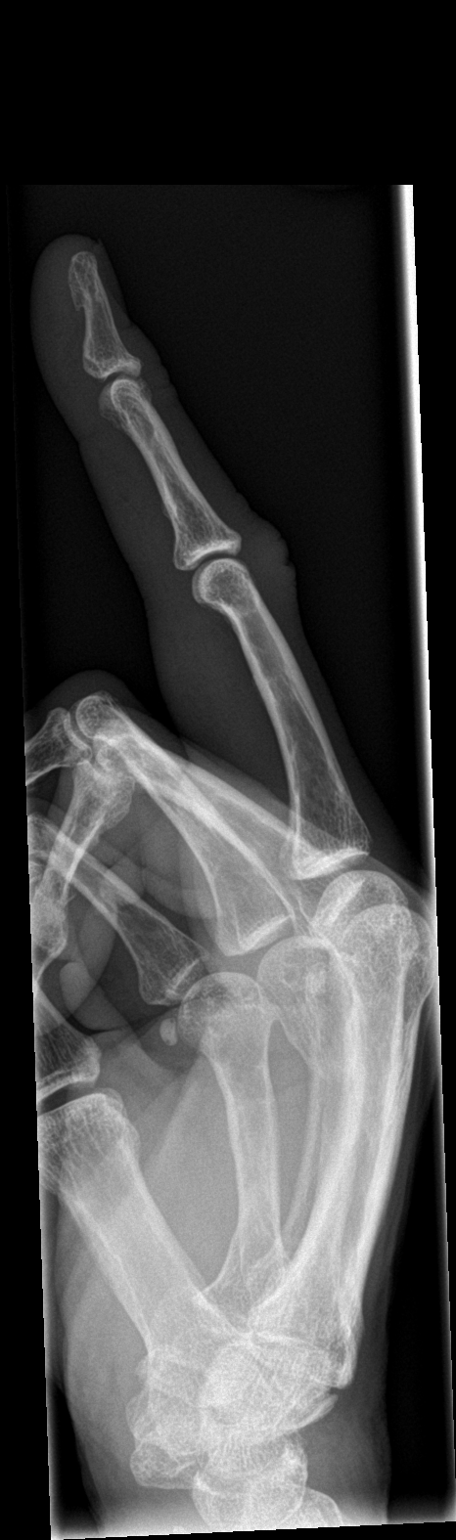

[3 of 3 positions shown; findings below may reference images not displayed]

FINDINGS: Frontal, oblique, and lateral views of the right third digit are
obtained. No fracture, subluxation, or dislocation. Joint spaces are
well preserved. Soft tissues are normal.
IMPRESSION: 1. Unremarkable right third digit.

## 2021-10-23 ENCOUNTER — Encounter: Payer: Self-pay | Admitting: General Surgery

## 2021-10-23 ENCOUNTER — Inpatient Hospital Stay
Admission: RE | Admit: 2021-10-23 | Discharge: 2021-11-24 | DRG: 335 | Disposition: A | Discharge status: Home / Self Care | Payer: Medicare Other | Attending: General Surgery | Admitting: General Surgery

## 2021-10-23 ENCOUNTER — Encounter
Admission: RE | Disposition: A | Discharge status: Home / Self Care | Payer: Self-pay | Source: Home / Self Care | Attending: General Surgery

## 2021-10-23 ENCOUNTER — Other Ambulatory Visit: Payer: Self-pay

## 2021-10-23 ENCOUNTER — Inpatient Hospital Stay: Payer: Medicare Other | Admitting: Urgent Care

## 2021-10-23 DIAGNOSIS — Z923 Personal history of irradiation: Secondary | ICD-10-CM

## 2021-10-23 DIAGNOSIS — Z803 Family history of malignant neoplasm of breast: Secondary | ICD-10-CM

## 2021-10-23 DIAGNOSIS — R509 Fever, unspecified: Secondary | ICD-10-CM | POA: Diagnosis not present

## 2021-10-23 DIAGNOSIS — K660 Peritoneal adhesions (postprocedural) (postinfection): Secondary | ICD-10-CM | POA: Diagnosis present

## 2021-10-23 DIAGNOSIS — D631 Anemia in chronic kidney disease: Secondary | ICD-10-CM | POA: Diagnosis present

## 2021-10-23 DIAGNOSIS — Z853 Personal history of malignant neoplasm of breast: Secondary | ICD-10-CM

## 2021-10-23 DIAGNOSIS — I482 Chronic atrial fibrillation, unspecified: Secondary | ICD-10-CM | POA: Diagnosis present

## 2021-10-23 DIAGNOSIS — N189 Chronic kidney disease, unspecified: Secondary | ICD-10-CM | POA: Diagnosis not present

## 2021-10-23 DIAGNOSIS — Z9049 Acquired absence of other specified parts of digestive tract: Secondary | ICD-10-CM

## 2021-10-23 DIAGNOSIS — D638 Anemia in other chronic diseases classified elsewhere: Secondary | ICD-10-CM | POA: Diagnosis not present

## 2021-10-23 DIAGNOSIS — I350 Nonrheumatic aortic (valve) stenosis: Secondary | ICD-10-CM | POA: Diagnosis present

## 2021-10-23 DIAGNOSIS — Z8616 Personal history of COVID-19: Secondary | ICD-10-CM

## 2021-10-23 DIAGNOSIS — M1990 Unspecified osteoarthritis, unspecified site: Secondary | ICD-10-CM | POA: Diagnosis present

## 2021-10-23 DIAGNOSIS — N2581 Secondary hyperparathyroidism of renal origin: Secondary | ICD-10-CM | POA: Diagnosis present

## 2021-10-23 DIAGNOSIS — I5033 Acute on chronic diastolic (congestive) heart failure: Secondary | ICD-10-CM

## 2021-10-23 DIAGNOSIS — J9621 Acute and chronic respiratory failure with hypoxia: Secondary | ICD-10-CM | POA: Diagnosis not present

## 2021-10-23 DIAGNOSIS — I700 Atherosclerosis of aorta: Secondary | ICD-10-CM | POA: Diagnosis present

## 2021-10-23 DIAGNOSIS — I480 Paroxysmal atrial fibrillation: Secondary | ICD-10-CM | POA: Diagnosis not present

## 2021-10-23 DIAGNOSIS — R0600 Dyspnea, unspecified: Secondary | ICD-10-CM

## 2021-10-23 DIAGNOSIS — I10 Essential (primary) hypertension: Secondary | ICD-10-CM | POA: Diagnosis present

## 2021-10-23 DIAGNOSIS — D62 Acute posthemorrhagic anemia: Secondary | ICD-10-CM | POA: Diagnosis not present

## 2021-10-23 DIAGNOSIS — T402X5A Adverse effect of other opioids, initial encounter: Secondary | ICD-10-CM | POA: Diagnosis not present

## 2021-10-23 DIAGNOSIS — I509 Heart failure, unspecified: Secondary | ICD-10-CM

## 2021-10-23 DIAGNOSIS — N179 Acute kidney failure, unspecified: Secondary | ICD-10-CM | POA: Diagnosis not present

## 2021-10-23 DIAGNOSIS — R710 Precipitous drop in hematocrit: Secondary | ICD-10-CM

## 2021-10-23 DIAGNOSIS — F32A Depression, unspecified: Secondary | ICD-10-CM | POA: Diagnosis present

## 2021-10-23 DIAGNOSIS — E860 Dehydration: Secondary | ICD-10-CM | POA: Diagnosis not present

## 2021-10-23 DIAGNOSIS — R443 Hallucinations, unspecified: Secondary | ICD-10-CM | POA: Diagnosis not present

## 2021-10-23 DIAGNOSIS — I130 Hypertensive heart and chronic kidney disease with heart failure and stage 1 through stage 4 chronic kidney disease, or unspecified chronic kidney disease: Secondary | ICD-10-CM | POA: Diagnosis present

## 2021-10-23 DIAGNOSIS — J690 Pneumonitis due to inhalation of food and vomit: Secondary | ICD-10-CM | POA: Diagnosis not present

## 2021-10-23 DIAGNOSIS — E1122 Type 2 diabetes mellitus with diabetic chronic kidney disease: Secondary | ICD-10-CM | POA: Diagnosis present

## 2021-10-23 DIAGNOSIS — K567 Ileus, unspecified: Secondary | ICD-10-CM | POA: Diagnosis not present

## 2021-10-23 DIAGNOSIS — Z932 Ileostomy status: Secondary | ICD-10-CM | POA: Diagnosis not present

## 2021-10-23 DIAGNOSIS — J449 Chronic obstructive pulmonary disease, unspecified: Secondary | ICD-10-CM | POA: Diagnosis present

## 2021-10-23 DIAGNOSIS — K9189 Other postprocedural complications and disorders of digestive system: Secondary | ICD-10-CM | POA: Diagnosis not present

## 2021-10-23 DIAGNOSIS — I5043 Acute on chronic combined systolic (congestive) and diastolic (congestive) heart failure: Secondary | ICD-10-CM | POA: Diagnosis not present

## 2021-10-23 DIAGNOSIS — R5383 Other fatigue: Secondary | ICD-10-CM

## 2021-10-23 DIAGNOSIS — R531 Weakness: Secondary | ICD-10-CM | POA: Diagnosis not present

## 2021-10-23 DIAGNOSIS — Z9981 Dependence on supplemental oxygen: Secondary | ICD-10-CM

## 2021-10-23 DIAGNOSIS — Z9012 Acquired absence of left breast and nipple: Secondary | ICD-10-CM

## 2021-10-23 DIAGNOSIS — E861 Hypovolemia: Secondary | ICD-10-CM | POA: Diagnosis not present

## 2021-10-23 DIAGNOSIS — Z9889 Other specified postprocedural states: Secondary | ICD-10-CM

## 2021-10-23 DIAGNOSIS — I4891 Unspecified atrial fibrillation: Secondary | ICD-10-CM | POA: Diagnosis not present

## 2021-10-23 DIAGNOSIS — R0602 Shortness of breath: Secondary | ICD-10-CM

## 2021-10-23 DIAGNOSIS — Z432 Encounter for attention to ileostomy: Secondary | ICD-10-CM | POA: Diagnosis present

## 2021-10-23 DIAGNOSIS — I4892 Unspecified atrial flutter: Secondary | ICD-10-CM | POA: Diagnosis present

## 2021-10-23 DIAGNOSIS — J90 Pleural effusion, not elsewhere classified: Secondary | ICD-10-CM

## 2021-10-23 DIAGNOSIS — Z7901 Long term (current) use of anticoagulants: Secondary | ICD-10-CM

## 2021-10-23 DIAGNOSIS — J918 Pleural effusion in other conditions classified elsewhere: Secondary | ICD-10-CM | POA: Diagnosis not present

## 2021-10-23 DIAGNOSIS — E782 Mixed hyperlipidemia: Secondary | ICD-10-CM | POA: Diagnosis present

## 2021-10-23 DIAGNOSIS — K56609 Unspecified intestinal obstruction, unspecified as to partial versus complete obstruction: Secondary | ICD-10-CM

## 2021-10-23 DIAGNOSIS — R778 Other specified abnormalities of plasma proteins: Secondary | ICD-10-CM | POA: Diagnosis not present

## 2021-10-23 DIAGNOSIS — N1832 Chronic kidney disease, stage 3b: Secondary | ICD-10-CM | POA: Diagnosis present

## 2021-10-23 DIAGNOSIS — R079 Chest pain, unspecified: Secondary | ICD-10-CM | POA: Diagnosis not present

## 2021-10-23 DIAGNOSIS — D649 Anemia, unspecified: Secondary | ICD-10-CM

## 2021-10-23 DIAGNOSIS — T502X5A Adverse effect of carbonic-anhydrase inhibitors, benzothiadiazides and other diuretics, initial encounter: Secondary | ICD-10-CM | POA: Diagnosis not present

## 2021-10-23 DIAGNOSIS — I890 Lymphedema, not elsewhere classified: Secondary | ICD-10-CM | POA: Diagnosis present

## 2021-10-23 DIAGNOSIS — Z4659 Encounter for fitting and adjustment of other gastrointestinal appliance and device: Secondary | ICD-10-CM

## 2021-10-23 DIAGNOSIS — E876 Hypokalemia: Secondary | ICD-10-CM | POA: Diagnosis not present

## 2021-10-23 DIAGNOSIS — J9601 Acute respiratory failure with hypoxia: Secondary | ICD-10-CM

## 2021-10-23 DIAGNOSIS — R001 Bradycardia, unspecified: Secondary | ICD-10-CM | POA: Diagnosis not present

## 2021-10-23 DIAGNOSIS — T501X5A Adverse effect of loop [high-ceiling] diuretics, initial encounter: Secondary | ICD-10-CM | POA: Diagnosis not present

## 2021-10-23 DIAGNOSIS — J811 Chronic pulmonary edema: Secondary | ICD-10-CM

## 2021-10-23 DIAGNOSIS — Z87891 Personal history of nicotine dependence: Secondary | ICD-10-CM

## 2021-10-23 DIAGNOSIS — K219 Gastro-esophageal reflux disease without esophagitis: Secondary | ICD-10-CM | POA: Diagnosis present

## 2021-10-23 DIAGNOSIS — F419 Anxiety disorder, unspecified: Secondary | ICD-10-CM | POA: Diagnosis present

## 2021-10-23 DIAGNOSIS — Z79899 Other long term (current) drug therapy: Secondary | ICD-10-CM

## 2021-10-23 DIAGNOSIS — I959 Hypotension, unspecified: Secondary | ICD-10-CM

## 2021-10-23 DIAGNOSIS — Z20822 Contact with and (suspected) exposure to covid-19: Secondary | ICD-10-CM | POA: Diagnosis present

## 2021-10-23 DIAGNOSIS — I9581 Postprocedural hypotension: Secondary | ICD-10-CM | POA: Diagnosis not present

## 2021-10-23 DIAGNOSIS — Z833 Family history of diabetes mellitus: Secondary | ICD-10-CM

## 2021-10-23 DIAGNOSIS — Z9221 Personal history of antineoplastic chemotherapy: Secondary | ICD-10-CM

## 2021-10-23 DIAGNOSIS — E872 Acidosis, unspecified: Secondary | ICD-10-CM | POA: Diagnosis not present

## 2021-10-23 DIAGNOSIS — J9811 Atelectasis: Secondary | ICD-10-CM | POA: Diagnosis not present

## 2021-10-23 DIAGNOSIS — E871 Hypo-osmolality and hyponatremia: Secondary | ICD-10-CM | POA: Diagnosis not present

## 2021-10-23 DIAGNOSIS — I2510 Atherosclerotic heart disease of native coronary artery without angina pectoris: Secondary | ICD-10-CM | POA: Diagnosis present

## 2021-10-23 DIAGNOSIS — Z8501 Personal history of malignant neoplasm of esophagus: Secondary | ICD-10-CM

## 2021-10-23 DIAGNOSIS — Z978 Presence of other specified devices: Secondary | ICD-10-CM

## 2021-10-23 DIAGNOSIS — R609 Edema, unspecified: Secondary | ICD-10-CM

## 2021-10-23 HISTORY — DX: Other forms of dyspnea: R06.09

## 2021-10-23 HISTORY — DX: Long term (current) use of anticoagulants: Z79.01

## 2021-10-23 HISTORY — PX: XI ROBOTIC ASSISTED COLOSTOMY TAKEDOWN: SHX6828

## 2021-10-23 HISTORY — DX: Atherosclerosis of aorta: I70.0

## 2021-10-23 HISTORY — DX: Hyperlipidemia, unspecified: E78.5

## 2021-10-23 HISTORY — DX: Chronic kidney disease, stage 4 (severe): N18.4

## 2021-10-23 HISTORY — DX: Vitamin D deficiency, unspecified: E55.9

## 2021-10-23 LAB — GLUCOSE, CAPILLARY: Glucose-Capillary: 143 mg/dL — ABNORMAL HIGH (ref 70–99)

## 2021-10-23 SURGERY — CLOSURE, COLOSTOMY, ROBOT-ASSISTED
Anesthesia: General

## 2021-10-23 MED ORDER — ORAL CARE MOUTH RINSE: 15.0000 mL | Freq: Once | OROMUCOSAL | Status: AC

## 2021-10-23 MED ORDER — ALVIMOPAN 12 MG PO CAPS
12.0000 mg | ORAL_CAPSULE | Freq: Two times a day (BID) | ORAL | Status: DC
  Administered 2021-10-24 (×2): 12 mg via ORAL
  Filled 2021-10-23 (×3): qty 1

## 2021-10-23 MED ORDER — BUPIVACAINE LIPOSOME 1.3 % IJ SUSP: 20.0000 mL | Freq: Once | INTRAMUSCULAR | Status: DC

## 2021-10-23 MED ORDER — CELECOXIB 200 MG PO CAPS
200.0000 mg | ORAL_CAPSULE | Freq: Two times a day (BID) | ORAL | Status: DC
  Administered 2021-10-23 – 2021-10-24 (×2): 200 mg via ORAL
  Filled 2021-10-23 (×3): qty 1

## 2021-10-23 MED ORDER — BUPIVACAINE LIPOSOME 1.3 % IJ SUSP
INTRAMUSCULAR | Status: AC
  Filled 2021-10-23: qty 20

## 2021-10-23 MED ORDER — ALBUTEROL SULFATE HFA 108 (90 BASE) MCG/ACT IN AERS
INHALATION_SPRAY | RESPIRATORY_TRACT | Status: AC
  Filled 2021-10-23: qty 6.7

## 2021-10-23 MED ORDER — FLUTICASONE-UMECLIDIN-VILANT 100-62.5-25 MCG/INH IN AEPB
1.0000 | INHALATION_SPRAY | RESPIRATORY_TRACT | Status: DC
Start: 2021-10-24 — End: 2021-10-23

## 2021-10-23 MED ORDER — UMECLIDINIUM BROMIDE 62.5 MCG/ACT IN AEPB
1.0000 | INHALATION_SPRAY | Freq: Every day | RESPIRATORY_TRACT | Status: DC
  Administered 2021-10-24 – 2021-11-23 (×30): 1 via RESPIRATORY_TRACT
  Filled 2021-10-23 (×5): qty 7

## 2021-10-23 MED ORDER — PROPOFOL 10 MG/ML IV BOLUS
INTRAVENOUS | Status: AC
  Filled 2021-10-23: qty 20

## 2021-10-23 MED ORDER — FLUTICASONE FUROATE-VILANTEROL 100-25 MCG/ACT IN AEPB
1.0000 | INHALATION_SPRAY | Freq: Every day | RESPIRATORY_TRACT | Status: DC
  Administered 2021-10-24 – 2021-11-22 (×30): 1 via RESPIRATORY_TRACT
  Filled 2021-10-23 (×3): qty 28

## 2021-10-23 MED ORDER — AMLODIPINE BESYLATE 5 MG PO TABS
5.0000 mg | ORAL_TABLET | ORAL | Status: DC
  Administered 2021-10-24: 5 mg via ORAL
  Filled 2021-10-23: qty 1

## 2021-10-23 MED ORDER — TORSEMIDE 20 MG PO TABS
20.0000 mg | ORAL_TABLET | Freq: Two times a day (BID) | ORAL | Status: DC
  Administered 2021-10-23 – 2021-10-24 (×2): 20 mg via ORAL
  Filled 2021-10-23 (×3): qty 1

## 2021-10-23 MED ORDER — GABAPENTIN 300 MG PO CAPS: 300.0000 mg | ORAL_CAPSULE | ORAL | Status: AC

## 2021-10-23 MED ORDER — BUPIVACAINE-EPINEPHRINE (PF) 0.25% -1:200000 IJ SOLN
INTRAMUSCULAR | Status: AC
  Filled 2021-10-23: qty 30

## 2021-10-23 MED ORDER — LACTATED RINGERS IV SOLN: INTRAVENOUS | Status: DC

## 2021-10-23 MED ORDER — SODIUM CHLORIDE 0.9 % IV SOLN
INTRAVENOUS | Status: AC
  Filled 2021-10-23: qty 2

## 2021-10-23 MED ORDER — ALVIMOPAN 12 MG PO CAPS: 12.0000 mg | ORAL_CAPSULE | ORAL | Status: AC

## 2021-10-23 MED ORDER — OXYCODONE-ACETAMINOPHEN 5-325 MG PO TABS
1.0000 | ORAL_TABLET | ORAL | Status: DC | PRN
  Administered 2021-10-24 – 2021-10-26 (×4): 2 via ORAL
  Administered 2021-10-27 – 2021-10-28 (×7): 1 via ORAL
  Administered 2021-10-29 – 2021-11-10 (×15): 2 via ORAL
  Administered 2021-11-10: 1 via ORAL
  Administered 2021-11-11 – 2021-11-12 (×4): 2 via ORAL
  Administered 2021-11-13: 1 via ORAL
  Administered 2021-11-13: 2 via ORAL
  Administered 2021-11-14 (×4): 1 via ORAL
  Administered 2021-11-15 – 2021-11-18 (×6): 2 via ORAL
  Administered 2021-11-18: 1 via ORAL
  Administered 2021-11-18 – 2021-11-22 (×6): 2 via ORAL
  Administered 2021-11-23 (×3): 1 via ORAL
  Filled 2021-10-23: qty 2
  Filled 2021-10-23 (×2): qty 1
  Filled 2021-10-23 (×2): qty 2
  Filled 2021-10-23 (×2): qty 1
  Filled 2021-10-23 (×8): qty 2
  Filled 2021-10-23: qty 1
  Filled 2021-10-23 (×3): qty 2
  Filled 2021-10-23 (×2): qty 1
  Filled 2021-10-23 (×5): qty 2
  Filled 2021-10-23: qty 1
  Filled 2021-10-23: qty 2
  Filled 2021-10-23: qty 1
  Filled 2021-10-23 (×2): qty 2
  Filled 2021-10-23: qty 1
  Filled 2021-10-23 (×4): qty 2
  Filled 2021-10-23: qty 1
  Filled 2021-10-23: qty 2
  Filled 2021-10-23: qty 1
  Filled 2021-10-23: qty 2
  Filled 2021-10-23: qty 1
  Filled 2021-10-23 (×5): qty 2
  Filled 2021-10-23 (×3): qty 1
  Filled 2021-10-23 (×4): qty 2
  Filled 2021-10-23: qty 1

## 2021-10-23 MED ORDER — ENOXAPARIN SODIUM 40 MG/0.4ML IJ SOSY
40.0000 mg | PREFILLED_SYRINGE | INTRAMUSCULAR | Status: DC
  Administered 2021-10-24: 40 mg via SUBCUTANEOUS
  Filled 2021-10-23: qty 0.4

## 2021-10-23 MED ORDER — ONDANSETRON HCL 4 MG/2ML IJ SOLN
INTRAMUSCULAR | Status: DC | PRN
  Administered 2021-10-23: 4 mg via INTRAVENOUS

## 2021-10-23 MED ORDER — DEXAMETHASONE SODIUM PHOSPHATE 10 MG/ML IJ SOLN
INTRAMUSCULAR | Status: DC | PRN
  Administered 2021-10-23: 10 mg via INTRAVENOUS

## 2021-10-23 MED ORDER — LIDOCAINE HCL (CARDIAC) PF 100 MG/5ML IV SOSY
PREFILLED_SYRINGE | INTRAVENOUS | Status: DC | PRN
  Administered 2021-10-23: 60 mg via INTRAVENOUS

## 2021-10-23 MED ORDER — SODIUM CHLORIDE 0.9 % IV SOLN
2.0000 g | INTRAVENOUS | Status: AC
  Administered 2021-10-23: 2 g via INTRAVENOUS

## 2021-10-23 MED ORDER — DEXAMETHASONE SODIUM PHOSPHATE 10 MG/ML IJ SOLN
INTRAMUSCULAR | Status: AC
  Filled 2021-10-23: qty 1

## 2021-10-23 MED ORDER — ONDANSETRON HCL 4 MG/2ML IJ SOLN: 4.0000 mg | Freq: Once | INTRAMUSCULAR | Status: DC | PRN

## 2021-10-23 MED ORDER — CELECOXIB 200 MG PO CAPS
ORAL_CAPSULE | ORAL | Status: AC
  Administered 2021-10-23: 200 mg via ORAL
  Filled 2021-10-23: qty 1

## 2021-10-23 MED ORDER — PHENYLEPHRINE HCL (PRESSORS) 10 MG/ML IV SOLN
INTRAVENOUS | Status: AC
  Filled 2021-10-23: qty 1

## 2021-10-23 MED ORDER — CITALOPRAM HYDROBROMIDE 20 MG PO TABS
40.0000 mg | ORAL_TABLET | ORAL | Status: DC
  Administered 2021-10-24 – 2021-11-24 (×32): 40 mg via ORAL
  Filled 2021-10-23 (×34): qty 2

## 2021-10-23 MED ORDER — SUGAMMADEX SODIUM 500 MG/5ML IV SOLN
INTRAVENOUS | Status: DC | PRN
  Administered 2021-10-23: 150 mg via INTRAVENOUS

## 2021-10-23 MED ORDER — PHENYLEPHRINE HCL-NACL 20-0.9 MG/250ML-% IV SOLN
INTRAVENOUS | Status: DC | PRN
  Administered 2021-10-23: 50 ug/min via INTRAVENOUS

## 2021-10-23 MED ORDER — FENTANYL CITRATE (PF) 100 MCG/2ML IJ SOLN
INTRAMUSCULAR | Status: AC
  Filled 2021-10-23: qty 2

## 2021-10-23 MED ORDER — ALVIMOPAN 12 MG PO CAPS
ORAL_CAPSULE | ORAL | Status: AC
  Administered 2021-10-23: 12 mg via ORAL
  Filled 2021-10-23: qty 1

## 2021-10-23 MED ORDER — ALBUTEROL SULFATE (2.5 MG/3ML) 0.083% IN NEBU
3.0000 mL | INHALATION_SOLUTION | Freq: Four times a day (QID) | RESPIRATORY_TRACT | Status: DC | PRN
  Administered 2021-11-08 – 2021-11-14 (×3): 3 mL via RESPIRATORY_TRACT
  Filled 2021-10-23 (×5): qty 3

## 2021-10-23 MED ORDER — CARVEDILOL 6.25 MG PO TABS
6.2500 mg | ORAL_TABLET | ORAL | Status: DC
  Administered 2021-10-24: 6.25 mg via ORAL
  Filled 2021-10-23: qty 1

## 2021-10-23 MED ORDER — GLYCOPYRROLATE 0.2 MG/ML IJ SOLN
INTRAMUSCULAR | Status: DC | PRN
  Administered 2021-10-23: .2 mg via INTRAVENOUS

## 2021-10-23 MED ORDER — ACETAMINOPHEN 500 MG PO TABS
ORAL_TABLET | ORAL | Status: AC
  Administered 2021-10-23: 1000 mg via ORAL
  Filled 2021-10-23: qty 2

## 2021-10-23 MED ORDER — ONDANSETRON HCL 4 MG/2ML IJ SOLN
4.0000 mg | Freq: Four times a day (QID) | INTRAMUSCULAR | Status: DC | PRN
  Administered 2021-10-27 – 2021-10-29 (×2): 4 mg via INTRAVENOUS
  Filled 2021-10-23 (×2): qty 2

## 2021-10-23 MED ORDER — ONDANSETRON HCL 4 MG/2ML IJ SOLN
INTRAMUSCULAR | Status: AC
  Filled 2021-10-23: qty 2

## 2021-10-23 MED ORDER — ROCURONIUM BROMIDE 100 MG/10ML IV SOLN
INTRAVENOUS | Status: DC | PRN
  Administered 2021-10-23 (×2): 10 mg via INTRAVENOUS
  Administered 2021-10-23: 50 mg via INTRAVENOUS
  Administered 2021-10-23: 10 mg via INTRAVENOUS

## 2021-10-23 MED ORDER — BUPIVACAINE-EPINEPHRINE (PF) 0.25% -1:200000 IJ SOLN
INTRAMUSCULAR | Status: DC | PRN
  Administered 2021-10-23: 30 mL

## 2021-10-23 MED ORDER — CHLORHEXIDINE GLUCONATE 0.12 % MT SOLN: 15.0000 mL | Freq: Once | OROMUCOSAL | Status: AC

## 2021-10-23 MED ORDER — FENTANYL CITRATE (PF) 100 MCG/2ML IJ SOLN: 25.0000 ug | INTRAMUSCULAR | Status: DC | PRN

## 2021-10-23 MED ORDER — ACETAMINOPHEN 500 MG PO TABS: 1000.0000 mg | ORAL_TABLET | ORAL | Status: AC

## 2021-10-23 MED ORDER — GABAPENTIN 300 MG PO CAPS
ORAL_CAPSULE | ORAL | Status: AC
  Administered 2021-10-23: 300 mg via ORAL
  Filled 2021-10-23: qty 1

## 2021-10-23 MED ORDER — SPY AGENT GREEN - (INDOCYANINE FOR INJECTION)
INTRAMUSCULAR | Status: DC | PRN
  Administered 2021-10-23: 2 mL via INTRAVENOUS

## 2021-10-23 MED ORDER — BUPIVACAINE LIPOSOME 1.3 % IJ SUSP
INTRAMUSCULAR | Status: DC | PRN
  Administered 2021-10-23: 20 mL

## 2021-10-23 MED ORDER — CHLORHEXIDINE GLUCONATE 0.12 % MT SOLN
OROMUCOSAL | Status: AC
  Administered 2021-10-23: 15 mL via OROMUCOSAL
  Filled 2021-10-23: qty 15

## 2021-10-23 MED ORDER — ROCURONIUM BROMIDE 10 MG/ML (PF) SYRINGE
PREFILLED_SYRINGE | INTRAVENOUS | Status: AC
  Filled 2021-10-23: qty 10

## 2021-10-23 MED ORDER — GABAPENTIN 100 MG PO CAPS
300.0000 mg | ORAL_CAPSULE | Freq: Two times a day (BID) | ORAL | Status: DC
  Administered 2021-10-23 – 2021-11-22 (×59): 300 mg via ORAL
  Filled 2021-10-23: qty 1
  Filled 2021-10-23 (×3): qty 3
  Filled 2021-10-23 (×2): qty 1
  Filled 2021-10-23: qty 3
  Filled 2021-10-23 (×3): qty 1
  Filled 2021-10-23: qty 3
  Filled 2021-10-23 (×4): qty 1
  Filled 2021-10-23: qty 3
  Filled 2021-10-23 (×2): qty 1
  Filled 2021-10-23 (×2): qty 3
  Filled 2021-10-23 (×2): qty 1
  Filled 2021-10-23: qty 3
  Filled 2021-10-23 (×2): qty 1
  Filled 2021-10-23: qty 3
  Filled 2021-10-23 (×4): qty 1
  Filled 2021-10-23: qty 3
  Filled 2021-10-23 (×2): qty 1
  Filled 2021-10-23 (×2): qty 3
  Filled 2021-10-23: qty 1
  Filled 2021-10-23: qty 3
  Filled 2021-10-23 (×3): qty 1
  Filled 2021-10-23: qty 3
  Filled 2021-10-23 (×7): qty 1
  Filled 2021-10-23: qty 3
  Filled 2021-10-23 (×7): qty 1
  Filled 2021-10-23: qty 3
  Filled 2021-10-23 (×3): qty 1

## 2021-10-23 MED ORDER — 0.9 % SODIUM CHLORIDE (POUR BTL) OPTIME
TOPICAL | Status: DC | PRN
  Administered 2021-10-23: 15 mL

## 2021-10-23 MED ORDER — SODIUM CHLORIDE 0.9 % IV SOLN: INTRAVENOUS | Status: DC

## 2021-10-23 MED ORDER — ALBUTEROL SULFATE HFA 108 (90 BASE) MCG/ACT IN AERS
INHALATION_SPRAY | RESPIRATORY_TRACT | Status: DC | PRN
Start: 2021-10-23 — End: 2021-10-23
  Administered 2021-10-23 (×2): 3 via RESPIRATORY_TRACT

## 2021-10-23 MED ORDER — PHENYLEPHRINE 40 MCG/ML (10ML) SYRINGE FOR IV PUSH (FOR BLOOD PRESSURE SUPPORT)
PREFILLED_SYRINGE | INTRAVENOUS | Status: DC | PRN
  Administered 2021-10-23 (×4): 100 ug via INTRAVENOUS

## 2021-10-23 MED ORDER — LIDOCAINE HCL (PF) 2 % IJ SOLN
INTRAMUSCULAR | Status: AC
  Filled 2021-10-23: qty 5

## 2021-10-23 MED ORDER — PANTOPRAZOLE SODIUM 40 MG IV SOLR
40.0000 mg | Freq: Every day | INTRAVENOUS | Status: DC
  Administered 2021-10-23 – 2021-11-10 (×19): 40 mg via INTRAVENOUS
  Filled 2021-10-23 (×19): qty 40

## 2021-10-23 MED ORDER — EPHEDRINE SULFATE 50 MG/ML IJ SOLN
INTRAMUSCULAR | Status: DC | PRN
  Administered 2021-10-23 (×4): 5 mg via INTRAVENOUS

## 2021-10-23 MED ORDER — PROPOFOL 10 MG/ML IV BOLUS
INTRAVENOUS | Status: DC | PRN
Start: 2021-10-23 — End: 2021-10-23
  Administered 2021-10-23: 30 mg via INTRAVENOUS
  Administered 2021-10-23: 100 mg via INTRAVENOUS

## 2021-10-23 MED ORDER — ONDANSETRON 4 MG PO TBDP
4.0000 mg | ORAL_TABLET | Freq: Four times a day (QID) | ORAL | Status: DC | PRN
  Administered 2021-10-29 – 2021-11-07 (×3): 4 mg via ORAL
  Filled 2021-10-23 (×3): qty 1

## 2021-10-23 MED ORDER — MORPHINE SULFATE (PF) 4 MG/ML IV SOLN
4.0000 mg | INTRAVENOUS | Status: DC | PRN
  Administered 2021-10-24: 4 mg via INTRAVENOUS
  Filled 2021-10-23: qty 1

## 2021-10-23 MED ORDER — MIDAZOLAM HCL 2 MG/2ML IJ SOLN
INTRAMUSCULAR | Status: AC
  Filled 2021-10-23: qty 2

## 2021-10-23 MED ORDER — FENTANYL CITRATE (PF) 100 MCG/2ML IJ SOLN
INTRAMUSCULAR | Status: DC | PRN
Start: 2021-10-23 — End: 2021-10-23
  Administered 2021-10-23 (×2): 50 ug via INTRAVENOUS

## 2021-10-23 MED ORDER — CELECOXIB 200 MG PO CAPS: 200.0000 mg | ORAL_CAPSULE | ORAL | Status: AC

## 2021-10-23 SURGICAL SUPPLY — 106 items
ADH SKN CLS APL DERMABOND .7 (GAUZE/BANDAGES/DRESSINGS) ×1
BLADE CLIPPER SURG (BLADE) IMPLANT
BLADE SURG SZ10 CARB STEEL (BLADE) ×2 IMPLANT
BLADE SURG SZ11 CARB STEEL (BLADE) ×2 IMPLANT
CANNULA CAP OBTURATR AIRSEAL 8 (CAP) ×1 IMPLANT
CANNULA REDUC XI 12-8 STAPL (CANNULA) ×1
CANNULA REDUCER 12-8 DVNC XI (CANNULA) ×1 IMPLANT
COVER TIP SHEARS 8 DVNC (MISCELLANEOUS) ×1 IMPLANT
COVER TIP SHEARS 8MM DA VINCI (MISCELLANEOUS) ×1
DERMABOND ADVANCED (GAUZE/BANDAGES/DRESSINGS) ×1
DERMABOND ADVANCED .7 DNX12 (GAUZE/BANDAGES/DRESSINGS) ×1 IMPLANT
DRAPE ARM DVNC X/XI (DISPOSABLE) ×4 IMPLANT
DRAPE COLUMN DVNC XI (DISPOSABLE) ×1 IMPLANT
DRAPE DA VINCI XI ARM (DISPOSABLE) ×4
DRAPE DA VINCI XI COLUMN (DISPOSABLE) ×1
DRAPE LEGGINS SURG 28X43 STRL (DRAPES) ×2 IMPLANT
DRAPE UNDER BUTTOCK W/FLU (DRAPES) ×1 IMPLANT
DRSG OPSITE POSTOP 4X10 (GAUZE/BANDAGES/DRESSINGS) IMPLANT
DRSG OPSITE POSTOP 4X8 (GAUZE/BANDAGES/DRESSINGS) IMPLANT
ELECT BLADE 6.5 EXT (BLADE) IMPLANT
ELECT CAUTERY BLADE 6.4 (BLADE) ×2 IMPLANT
ELECT REM PT RETURN 9FT ADLT (ELECTROSURGICAL) ×2
ELECTRODE REM PT RTRN 9FT ADLT (ELECTROSURGICAL) ×1 IMPLANT
GAUZE 4X4 16PLY ~~LOC~~+RFID DBL (SPONGE) ×2 IMPLANT
GAUZE PACKING IODOFORM 1/2 (PACKING) ×2 IMPLANT
GAUZE SPONGE 4X4 12PLY STRL (GAUZE/BANDAGES/DRESSINGS) ×1 IMPLANT
GLOVE SURG ENC MOIS LTX SZ6.5 (GLOVE) ×6 IMPLANT
GLOVE SURG UNDER POLY LF SZ6.5 (GLOVE) ×6 IMPLANT
GOWN STRL REUS W/ TWL LRG LVL3 (GOWN DISPOSABLE) ×6 IMPLANT
GOWN STRL REUS W/TWL LRG LVL3 (GOWN DISPOSABLE) ×12
GRASPER SUT TROCAR 14GX15 (MISCELLANEOUS) ×2 IMPLANT
HANDLE YANKAUER SUCT BULB TIP (MISCELLANEOUS) ×2 IMPLANT
IRRIGATION STRYKERFLOW (MISCELLANEOUS) IMPLANT
IRRIGATOR STRYKERFLOW (MISCELLANEOUS)
IRRIGATOR SUCT 8 DISP DVNC XI (IRRIGATION / IRRIGATOR) IMPLANT
IRRIGATOR SUCTION 8MM XI DISP (IRRIGATION / IRRIGATOR)
IV NS 1000ML (IV SOLUTION)
IV NS 1000ML BAXH (IV SOLUTION) IMPLANT
KIT PINK PAD W/HEAD ARE REST (MISCELLANEOUS) ×2
KIT PINK PAD W/HEAD ARM REST (MISCELLANEOUS) ×1 IMPLANT
LABEL OR SOLS (LABEL) ×2 IMPLANT
MANIFOLD NEPTUNE II (INSTRUMENTS) ×2 IMPLANT
NDL INSUFFLATION 14GA 120MM (NEEDLE) ×1 IMPLANT
NEEDLE HYPO 22GX1.5 SAFETY (NEEDLE) ×2 IMPLANT
NEEDLE INSUFFLATION 14GA 120MM (NEEDLE) ×2 IMPLANT
OBTURATOR OPTICAL STANDARD 8MM (TROCAR) ×1
OBTURATOR OPTICAL STND 8 DVNC (TROCAR) ×1
OBTURATOR OPTICALSTD 8 DVNC (TROCAR) ×1 IMPLANT
PACK COLON CLEAN CLOSURE (MISCELLANEOUS) ×1 IMPLANT
PACK LAP CHOLECYSTECTOMY (MISCELLANEOUS) ×2 IMPLANT
PACK SPY-PHI (KITS) ×1 IMPLANT
PENCIL ELECTRO HAND CTR (MISCELLANEOUS) ×2 IMPLANT
PORT ACCESS TROCAR AIRSEAL 5 (TROCAR) ×2 IMPLANT
RELOAD LINEAR CUT PROX 55 BLUE (ENDOMECHANICALS) ×4 IMPLANT
RELOAD STAPLE 45 3.5 BLU DVNC (STAPLE) IMPLANT
RELOAD STAPLE 55 3.8 BLU REG (ENDOMECHANICALS) IMPLANT
RELOAD STAPLE 60 2.5 WHT DVNC (STAPLE) IMPLANT
RELOAD STAPLE 60 3.5 BLU DVNC (STAPLE) IMPLANT
RELOAD STAPLER 2.5X60 WHT DVNC (STAPLE) ×1 IMPLANT
RELOAD STAPLER 3.5X45 BLU DVNC (STAPLE) IMPLANT
RELOAD STAPLER 3.5X60 BLU DVNC (STAPLE) ×1 IMPLANT
RETRACTOR WOUND ALXS 18CM SML (MISCELLANEOUS) IMPLANT
RTRCTR WOUND ALEXIS O 18CM SML (MISCELLANEOUS)
SEAL CANN UNIV 5-8 DVNC XI (MISCELLANEOUS) ×3 IMPLANT
SEAL XI 5MM-8MM UNIVERSAL (MISCELLANEOUS) ×4
SEALER VESSEL DA VINCI XI (MISCELLANEOUS)
SEALER VESSEL EXT DVNC XI (MISCELLANEOUS) IMPLANT
SET TRI-LUMEN FLTR TB AIRSEAL (TUBING) ×2 IMPLANT
SET TUBE FILTERED XL AIRSEAL (SET/KITS/TRAYS/PACK) ×1 IMPLANT
SOL PREP PVP 2OZ (MISCELLANEOUS) ×4
SOLUTION ELECTROLUBE (MISCELLANEOUS) ×2 IMPLANT
SOLUTION PREP PVP 2OZ (MISCELLANEOUS) ×1 IMPLANT
SPONGE T-LAP 18X18 ~~LOC~~+RFID (SPONGE) ×2 IMPLANT
SPONGE T-LAP 4X18 ~~LOC~~+RFID (SPONGE) ×2 IMPLANT
STAPLER 45 DA VINCI SURE FORM (STAPLE)
STAPLER 45 SUREFORM DVNC (STAPLE) IMPLANT
STAPLER 60 DA VINCI SURE FORM (STAPLE) ×1
STAPLER 60 SUREFORM DVNC (STAPLE) IMPLANT
STAPLER CANNULA SEAL DVNC XI (STAPLE) ×1 IMPLANT
STAPLER CANNULA SEAL XI (STAPLE) ×1
STAPLER PROXIMATE 55 BLUE (STAPLE) ×1 IMPLANT
STAPLER RELOAD 2.5X60 WHITE (STAPLE) ×1
STAPLER RELOAD 2.5X60 WHT DVNC (STAPLE) ×1
STAPLER RELOAD 3.5X45 BLU DVNC (STAPLE)
STAPLER RELOAD 3.5X45 BLUE (STAPLE)
STAPLER RELOAD 3.5X60 BLU DVNC (STAPLE) ×1
STAPLER RELOAD 3.5X60 BLUE (STAPLE) ×1
SURGILUBE 2OZ TUBE FLIPTOP (MISCELLANEOUS) ×2 IMPLANT
SUT MNCRL 4-0 (SUTURE) ×4
SUT MNCRL 4-0 27XMFL (SUTURE) ×2
SUT MON AB 2-0 CT1 36 (SUTURE) ×1 IMPLANT
SUT PDS PLUS 0 (SUTURE) ×2
SUT PDS PLUS AB 0 CT-2 (SUTURE) ×2 IMPLANT
SUT SILK 0 SH 30 (SUTURE) ×4 IMPLANT
SUT SILK 3-0 (SUTURE) IMPLANT
SUT STRATAFIX 0 PDS+ CT-2 23 (SUTURE) ×4
SUT VIC AB 3-0 SH 27 (SUTURE) ×8
SUT VIC AB 3-0 SH 27X BRD (SUTURE) ×4 IMPLANT
SUT VICRYL 0 AB UR-6 (SUTURE) ×4 IMPLANT
SUT VLOC 90 6 CV-15 VIOLET (SUTURE) ×2 IMPLANT
SUTURE MNCRL 4-0 27XMF (SUTURE) ×2 IMPLANT
SUTURE STRATFX 0 PDS+ CT-2 23 (SUTURE) IMPLANT
SYR 30ML LL (SYRINGE) ×2 IMPLANT
TAPE PAPER 2X10 WHT MICROPORE (GAUZE/BANDAGES/DRESSINGS) ×1 IMPLANT
TRAY FOLEY MTR SLVR 16FR STAT (SET/KITS/TRAYS/PACK) ×2 IMPLANT
WATER STERILE IRR 500ML POUR (IV SOLUTION) ×2 IMPLANT

## 2021-10-23 NOTE — Interval H&P Note (Signed)
History and Physical Interval Note:  10/23/2021 6:45 AM  Danielle [name]  has presented today for surgery, with the diagnosis of Z93.2 Ileostomy status.  The various methods of treatment have been discussed with the patient and family. After consideration of risks, benefits and other options for treatment, the patient has consented to  Procedure(s) with comments: XI ROBOTIC ASSISTED ILEOSTOMY TAKEDOWN (N/A) as a surgical intervention.  The patient's history has been reviewed, patient examined, no change in status, stable for surgery.  I have reviewed the patient's chart and labs.  Questions were answered to the patient's satisfaction.     Herbert Pun

## 2021-10-23 NOTE — Anesthesia Procedure Notes (Signed)
Procedure Name: Intubation Date/Time: 10/23/2021 7:36 AM Performed by: Lerry Liner, CRNA Pre-anesthesia Checklist: Patient identified, Emergency Drugs available, Suction available and Patient being monitored Patient Re-evaluated:Patient Re-evaluated prior to induction Oxygen Delivery Method: Circle system utilized Preoxygenation: Pre-oxygenation with 100% oxygen Induction Type: IV induction Ventilation: Mask ventilation without difficulty Laryngoscope Size: Glidescope and 3 Grade View: Grade I Tube type: Oral Tube size: 7.0 mm Number of attempts: 1 Airway Equipment and Method: Stylet and Oral airway Placement Confirmation: ETT inserted through vocal cords under direct vision, positive ETCO2 and breath sounds checked- equal and bilateral Secured at: 21 cm Tube secured with: Tape Dental Injury: Teeth and Oropharynx as per pre-operative assessment

## 2021-10-23 NOTE — Transfer of Care (Signed)
Immediate Anesthesia Transfer of Care Note  Patient: Danielle [name]  Procedure(s) Performed: XI ROBOTIC ASSISTED ILEOSTOMY TAKEDOWN INDOCYANINE GREEN FLUORESCENCE IMAGING ([facility])  Patient Location: PACU  Anesthesia Type:General  Level of Consciousness: awake and patient cooperative  Airway & Oxygen Therapy: Patient Spontanous Breathing and Patient connected to face mask oxygen  Post-op Assessment: Report given to RN and Post -op Vital signs reviewed and stable  Post vital signs: Reviewed and stable  Last Vitals:  Vitals Value Taken Time  BP    Temp    Pulse    Resp    SpO2      Last Pain:  Vitals:   10/23/21 0609  TempSrc: Oral         Complications: No notable events documented.

## 2021-10-23 NOTE — Op Note (Signed)
Preoperative diagnosis: Ileostomy status  Postoperative diagnosis: Ileostomy status  Procedure: Robotic assisted laparoscopic ileostomy reversal.   Anesthesia: GETA   Surgeon: Herbert Pun, MD  Assistant: None   Wound Classification: clean contaminated   Specimen: Mucous fistula                    Ileostomy   Complications: None   Estimated Blood Loss: 25 mL   Indications: Patient is a 66 y.o. female with an ileostomy since 3 years ago due to ascending colon bleeding.   FIndings: 1.  Healthy mucous fistula and transverse colon was identified 2.  Healthy ileum 3.  ICG green shows adequate perfusion of the anastomosis pedicle flap.  4.  Abundant amount of intra-abdominal adhesions.   5.  Adequate hemostasis        Description of procedure: The patient was placed on the operating table in the supine position, both arms tucked. General anesthesia was induced. A time-out was completed verifying correct patient, procedure, site, positioning, and implant(s) and/or special equipment prior to beginning this procedure. The abdomen was prepped and draped in the usual sterile fashion.    A circular incision was done around the mucous fistula.  Dissection was taken down to the tear fascia.  The mucous fistula was dissected free from the fascia.  The border of the mucous fistula was freshened with GIA stapler.  The hand I proceeded to do the ileostomy takedown.  A circular incision was done around the ileostomy.  Dissection was taken down to the fascia.  The ileum was dissected out of the fascia.  The terminal ileum was freshened with GIA stapler.  Transverse colon and ileum were reduced back into the abdominal cavity.  The skin of the ileostomy wound was temporarily closed with a running suture to achieve pneumoperitoneum.  A mini GelPort was placed on the mucous fistula wound.  Gas insufflation was initiated until the abdominal pressure was measured at 15 mmHg.  Afterwards, a 12  mm Trocar was inserted though the Gelport. 3 additional incision was made 5 cm apart along the left side of the abdominal wall from the initial incision and 45mm port placed under direct visualization. No injuries from trocar placements were noted. The table was placed in the reverse Trendelenburg position with the right side elevated.  Xi robotic platform was then brought to the operative field and docked at an angle from the left lower quadrant.  Tip up grasper and scissors was placed in right arm ports.  Fenestrated bipolar in left arm port.   Examination of the abdominal cavity noted abundant amount of intra-abdominal adhesions.  Time-consuming lysis of adhesion was done.  Once the lysis of adhesion was completed the neoterminal ileum was identified.  ICG green was flush and a 0.5 cm area of decreased perfusion was noted on the terminal ileum.  This portion was resected with stapler.  New terminal ileum with adequate ICG perfusion.    The small bowel was then brought towards the transverse colon and a isoperistaltic anastomosis was created.  Enterotomies were made in the small bowel and the transverse colon, small bowel enterotomy created 2 cm from the staple line and transverse colon enterotomy made 8 cm from the staple line.  60 mm white load stapler placed through these enterotomies and new anastomosis created.  The enterotomy was then closed by placing 2 anchor sutures at the 2 apexes using 2-0 silk, then running 3-0 V-Lock in a 2 layer fashion.  No bleeding or additional pathology was noted. Robot was then undocked, and the remaining port sites were removed, the abdomen was allowed to collapse.  The ileostomy and the mucous fistula fascia was closed with 0-stratafix.  The skin of the ileostomy and mucous fistula was partially closed with Monocryl with iodoform packing left in place.   Exparel infused at all port sites. All skin incisions then closed with subcuticular sutures Monocryl 4-0.  Wounds  then dressed with dermabond.   The patient tolerated the procedure well, awakened from anesthesia and was taken to the postanesthesia care unit in satisfactory condition.  Sponge count and instrument count correct at the end of the procedure.

## 2021-10-23 NOTE — Anesthesia Preprocedure Evaluation (Signed)
Anesthesia Evaluation  Patient identified by MRN, date of birth, ID band Patient awake    Reviewed: Allergy & Precautions, H&P , NPO status , Patient's Chart, lab work & pertinent test results  History of Anesthesia Complications Negative for: history of anesthetic complications  Airway Mallampati: II  TM Distance: >3 FB     Dental  (+) Missing, Edentulous Upper, Dental Advidsory Given   Pulmonary shortness of breath and with exertion, COPD,  COPD inhaler and oxygen dependent, neg recent URI, former smoker,    breath sounds clear to auscultation + decreased breath sounds      Cardiovascular hypertension, (-) angina+CHF  (-) Past MI and (-) Cardiac Stents (-) dysrhythmias + Valvular Problems/Murmurs  Rhythm:regular Rate:Normal  1. EF 50 to 55%. Mild LVH. 2. Grade I diastolic dysfunction (impaired relaxation).  3. Small to moderate inferior myocardial infarction.  4. The left ventricle demonstrates regional wall motion abnormalities.  5. Normal RV 6. Moderately elevated pulmonary artery systolic pressure.    Neuro/Psych PSYCHIATRIC DISORDERS Anxiety Depression negative neurological ROS     GI/Hepatic Neg liver ROS, PUD, GERD  ,  Endo/Other  negative endocrine ROS  Renal/GU CRFRenal disease  negative genitourinary   Musculoskeletal   Abdominal   Peds  Hematology  (+) Blood dyscrasia, anemia ,   Anesthesia Other Findings Past Medical History: No date: Anxiety No date: Arthritis 2000: Breast cancer ([place]) left : Cancer ([place])     Comment:  breast cancer 2000, chemo tx's with total mastectomy and              lymph nodes resected.  02/21/2016: Cancer of right lung ([place])     Comment:  rad tx's.  No date: CHF (congestive heart failure) ([facility]) No date: COPD (chronic obstructive pulmonary disease) ([facility]) No date: Dependence on supplemental oxygen No date: Depression No date: Heart murmur No date: Hypertension No  date: Lung nodule No date: Lymphedema No date: Personal history of chemotherapy No date: Personal history of radiation therapy No date: Shortness of breath dyspnea     Comment:  with exertion 2001: Status post chemotherapy     Comment:  left breast cancer 2001: Status post radiation therapy     Comment:  left breast cancer  Past Surgical History: No date: Breast Biospy; Left     Comment:  [facility] No date: BREAST SURGERY 04/30/2018: COLONOSCOPY; N/A     Comment:  Procedure: COLONOSCOPY;  Surgeon: Virgel Manifold,               MD;  Location: [facility] ENDOSCOPY;  Service: Endoscopy;                Laterality: N/A; 07/22/2018: COLONOSCOPY; N/A     Comment:  Procedure: COLONOSCOPY;  Surgeon: Virgel Manifold,               MD;  Location: [facility] ENDOSCOPY;  Service: Endoscopy;                Laterality: N/A; No date: DILATION AND CURETTAGE OF UTERUS 04/11/2016: ELECTROMAGNETIC NAVIGATION BROCHOSCOPY; Right     Comment:  Procedure: ELECTROMAGNETIC NAVIGATION BRONCHOSCOPY;                Surgeon: Vilinda Boehringer, MD;  Location: [facility] ORS;                Service: Cardiopulmonary;  Laterality: Right; 07/22/2018: ESOPHAGOGASTRODUODENOSCOPY; N/A     Comment:  Procedure: ESOPHAGOGASTRODUODENOSCOPY (EGD);  Surgeon:  Virgel Manifold, MD;  Location: [facility] ENDOSCOPY;                Service: Endoscopy;  Laterality: N/A; 05/07/2018: ESOPHAGOGASTRODUODENOSCOPY (EGD) WITH PROPOFOL; N/A     Comment:  Procedure: ESOPHAGOGASTRODUODENOSCOPY (EGD) WITH               PROPOFOL;  Surgeon: Lucilla Lame, MD;  Location: [facility]               ENDOSCOPY;  Service: Endoscopy;  Laterality: N/A; 04/24/2019: ESOPHAGOGASTRODUODENOSCOPY (EGD) WITH PROPOFOL; N/A     Comment:  Procedure: ESOPHAGOGASTRODUODENOSCOPY (EGD) WITH               PROPOFOL;  Surgeon: Jonathon Bellows, MD;  [facility]               ENDOSCOPY;  Service: Gastroenterology;  Laterality: N/A; 05/07/2019: EUS; N/A     Comment:  Procedure: FULL  UPPER ENDOSCOPIC ULTRASOUND (EUS)               RADIAL;  Surgeon: Jola Schmidt, MD;  Location: [facility]               ENDOSCOPY;  Service: Endoscopy;  Laterality: N/A; No date: history of colonoscopy] 07/22/2018: ILEOSCOPY; N/A     Comment:  Procedure: ILEOSCOPY THROUGH STOMA;  Surgeon: Virgel Manifold, MD;  Location: [facility] ENDOSCOPY;  Service:               Endoscopy;  Laterality: N/A; No date: ILEOSTOMY 09/08/2018: ILEOSTOMY; N/A     Comment:  Procedure: ILEOSTOMY REVISION POSSIBLE CREATION;                Surgeon: Herbert Pun, MD;  Location: [facility] ORS;               Service: General;  Laterality: N/A; 08/15/2018: ILEOSTOMY CLOSURE; N/A     Comment:  Procedure: DILATION OF ILEOSTOMY STRICTURE;  Surgeon:               Herbert Pun, MD;  Location: [facility] ORS;  Service:              General;  Laterality: N/A; 05/04/2018: LAPAROTOMY; Right     Comment:  Procedure: EXPLORATORY LAPAROTOMY right colectomy right               and left ostomy;  Surgeon: Herbert Pun, MD;                Location: [facility] ORS;  Service: General;  Laterality:               Right; No date: LUNG BIOPSY No date: MASTECTOMY; Left     Comment:  2000, [facility] No date: ROTATOR CUFF REPAIR; Right     Comment:  [facility]     Reproductive/Obstetrics negative OB ROS                             Anesthesia Physical  Anesthesia Plan  ASA: 3  Anesthesia Plan: General   Post-op Pain Management:    Induction: Intravenous  PONV Risk Score and Plan: 3 and Ondansetron, Dexamethasone, Treatment may vary due to age or medical condition and Midazolam  Airway Management Planned: Oral ETT  Additional Equipment:   Intra-op Plan:   Post-operative Plan: Extubation in OR  Informed Consent: I have reviewed the patients History and Physical, chart,  labs and discussed the procedure including the risks, benefits and alternatives for the proposed anesthesia with the patient  or authorized representative who has indicated his/her understanding and acceptance.     Dental Advisory Given  Plan Discussed with: Anesthesiologist  Anesthesia Plan Comments:         Anesthesia Quick Evaluation

## 2021-10-23 NOTE — Anesthesia Postprocedure Evaluation (Signed)
Anesthesia Post Note  Patient: Danielle [name]  Procedure(s) Performed: XI ROBOTIC ASSISTED ILEOSTOMY TAKEDOWN INDOCYANINE GREEN FLUORESCENCE IMAGING (ICG)  Patient location during evaluation: PACU Anesthesia Type: General Level of consciousness: sedated, oriented and patient cooperative Pain management: pain level controlled Vital Signs Assessment: post-procedure vital signs reviewed and stable Respiratory status: spontaneous breathing, nonlabored ventilation, respiratory function stable and patient connected to nasal cannula oxygen Cardiovascular status: blood pressure returned to baseline and stable Postop Assessment: no apparent nausea or vomiting Anesthetic complications: no   No notable events documented.   Last Vitals:  Vitals:   10/23/21 1500 10/23/21 1545  BP: 105/61 113/73  Pulse: 79 78  Resp: 13 14  Temp:    SpO2: 100% 98%    Last Pain:  Vitals:   10/23/21 1430  TempSrc:   PainSc: 0-No pain                 Martha Clan

## 2021-10-24 ENCOUNTER — Encounter: Payer: Self-pay | Admitting: General Surgery

## 2021-10-24 DIAGNOSIS — Z932 Ileostomy status: Secondary | ICD-10-CM | POA: Diagnosis not present

## 2021-10-24 LAB — BASIC METABOLIC PANEL
Anion gap: 6 (ref 5–15)
BUN: 32 mg/dL — ABNORMAL HIGH (ref 8–23)
CO2: 15 mmol/L — ABNORMAL LOW (ref 22–32)
Calcium: 8.1 mg/dL — ABNORMAL LOW (ref 8.9–10.3)
Chloride: 108 mmol/L (ref 98–111)
Creatinine, Ser: 2.58 mg/dL — ABNORMAL HIGH (ref 0.44–1.00)
GFR, Estimated: 20 mL/min — ABNORMAL LOW (ref >60)
Glucose, Bld: 155 mg/dL — ABNORMAL HIGH (ref 70–99)
Potassium: 4.8 mmol/L (ref 3.5–5.1)
Sodium: 129 mmol/L — ABNORMAL LOW (ref 135–145)

## 2021-10-24 LAB — CBC
HCT: 24.7 % — ABNORMAL LOW (ref 36.0–46.0)
Hemoglobin: 8 g/dL — ABNORMAL LOW (ref 12.0–15.0)
MCH: 30.2 pg (ref 26.0–34.0)
MCHC: 32.4 g/dL (ref 30.0–36.0)
MCV: 93.2 fL (ref 80.0–100.0)
Platelets: 416 10*3/uL — ABNORMAL HIGH (ref 150–400)
RBC: 2.65 MIL/uL — ABNORMAL LOW (ref 3.87–5.11)
RDW: 13.8 % (ref 11.5–15.5)
WBC: 19.3 10*3/uL — ABNORMAL HIGH (ref 4.0–10.5)
nRBC: 0 % (ref 0.0–0.2)

## 2021-10-24 LAB — MAGNESIUM: Magnesium: 1.9 mg/dL (ref 1.7–2.4)

## 2021-10-24 LAB — SURGICAL PATHOLOGY

## 2021-10-24 LAB — PHOSPHORUS: Phosphorus: 4.7 mg/dL — ABNORMAL HIGH (ref 2.5–4.6)

## 2021-10-24 MED ORDER — SODIUM CHLORIDE 0.9 % IV SOLN: INTRAVENOUS | Status: AC

## 2021-10-24 MED ORDER — BOOST / RESOURCE BREEZE PO LIQD CUSTOM: 1.0000 | Freq: Three times a day (TID) | ORAL | Status: DC

## 2021-10-24 MED ORDER — LACTATED RINGERS IV BOLUS
1000.0000 mL | Freq: Once | INTRAVENOUS | Status: AC
  Administered 2021-10-24: 1000 mL via INTRAVENOUS

## 2021-10-24 MED ORDER — SODIUM CHLORIDE 0.9 % IV SOLN: INTRAVENOUS | Status: DC

## 2021-10-24 MED ORDER — ADULT MULTIVITAMIN W/MINERALS CH
1.0000 | ORAL_TABLET | Freq: Every day | ORAL | Status: DC
  Administered 2021-10-25 – 2021-11-01 (×7): 1 via ORAL
  Filled 2021-10-24 (×9): qty 1

## 2021-10-24 MED ORDER — TORSEMIDE 10 MG PO TABS
10.0000 mg | ORAL_TABLET | Freq: Two times a day (BID) | ORAL | Status: DC
  Filled 2021-10-24: qty 1

## 2021-10-24 NOTE — Evaluation (Signed)
Physical Therapy Evaluation Patient Details Name: EMERSYNN DEATLEY MRN: [id number] DOB: 13-Oct-1956 Today's Date: 10/24/2021  History of Present Illness  66 y/o female s/p iliostomy take down 1/09.  Clinical Impression  Pt did well with mobility and did not need direct assist for any mobility.  She does not typically need AD for mobility but was clearly reliant on the walker and did fatigue quicker than she reports she typically does.  Pt did not show any overt safety issues but again is not at her baseline and PT therefore recommending HHPT though she is not overly interested.  Will plan to trial stairs before discharge to insure safety with their negotiation.  Overall pt doing well POD1.   Recommendations for follow up therapy are one component of a multi-disciplinary discharge planning process, led by the attending physician.  Recommendations may be updated based on patient status, additional functional criteria and insurance authorization.  Follow Up Recommendations Home health PT (pt reports she is not overly interested in having PT after d/c)    Assistance Recommended at Discharge PRN  Patient can return home with the following       Equipment Recommendations None recommended by PT (pt has FWW and assures me she will use it at d/c)  Recommendations for Other Services       Functional Status Assessment Patient has had a recent decline in their functional status and demonstrates the ability to make significant improvements in function in a reasonable and predictable amount of time.     Precautions / Restrictions Precautions Precautions: Fall Restrictions Weight Bearing Restrictions: No      Mobility  Bed Mobility Overal bed mobility: Modified Independent             General bed mobility comments: able to get to sitting EOB w/o assist    Transfers Overall transfer level: Modified independent Equipment used: Rolling walker (2 wheels)               General  transfer comment: Pt was able to rise to standing without assist, minimal cuing to insure appropriate use of UEs and walker    Ambulation/Gait Ambulation/Gait assistance: Supervision Gait Distance (Feet): 75 Feet Assistive device: Rolling walker (2 wheels)         General Gait Details: Pt did well with slow and steady ambulation.  She did need 2L O2 t/o the effort to maintain mid 90s (dropped to low90s high 80s with attempts at bed mobility on room air)  Stairs            Wheelchair Mobility    Modified Rankin (Stroke Patients Only)       Balance Overall balance assessment: Needs assistance Sitting-balance support: No upper extremity supported Sitting balance-Leahy Scale: Good       Standing balance-Leahy Scale: Fair Standing balance comment: reliant on the walker in both dynamic and static standing.  No LOBs but far from her baseline w/o AD                             Pertinent Vitals/Pain Pain Assessment: Faces Faces Pain Scale: Hurts even more Pain Location: some R shoulder pain after walking, reports abdomen has been sore with occasional temporary sharp pain    Home Living Family/patient expects to be discharged to:: Private residence Living Arrangements: Children;Other relatives (granddaughter) Available Help at Discharge: Family   Home Access: Stairs to enter Entrance Stairs-Rails: Right Entrance Stairs-Number of Steps: 4  Home Layout: One level Home Equipment: Cane - single Barista (2 wheels);BSC/3in1      Prior Function Prior Level of Function : Independent/Modified Independent             Mobility Comments: Pt reports that she only occasionally needs a cane when she has a gout flare-up, can get out of the home PRN       Hand Dominance        Extremity/Trunk Assessment   Upper Extremity Assessment Upper Extremity Assessment: Overall WFL for tasks assessed    Lower Extremity Assessment Lower Extremity  Assessment: Overall WFL for tasks assessed       Communication   Communication: No difficulties  Cognition Arousal/Alertness: Awake/alert Behavior During Therapy: WFL for tasks assessed/performed Overall Cognitive Status: Within Functional Limits for tasks assessed                                          General Comments General comments (skin integrity, edema, etc.): Pt did well with mobility, slower and more guarded than baseline (and needing AD) but able to move relatively well    Exercises     Assessment/Plan    PT Assessment Patient needs continued PT services  PT Problem List Decreased strength;Decreased activity tolerance;Decreased balance;Decreased mobility;Decreased knowledge of use of DME;Decreased safety awareness       PT Treatment Interventions DME instruction;Gait training;Stair training;Functional mobility training;Therapeutic activities;Therapeutic exercise;Balance training;Neuromuscular re-education;Patient/family education    PT Goals (Current goals can be found in the Care Plan section)  Acute Rehab PT Goals Patient Stated Goal: go home PT Goal Formulation: With patient Time For Goal Achievement: 11/07/21 Potential to Achieve Goals: Fair    Frequency Min 2X/week     Co-evaluation               AM-PAC PT "6 Clicks" Mobility  Outcome Measure Help needed turning from your back to your side while in a flat bed without using bedrails?: None Help needed moving from lying on your back to sitting on the side of a flat bed without using bedrails?: None Help needed moving to and from a bed to a chair (including a wheelchair)?: A Little Help needed standing up from a chair using your arms (e.g., wheelchair or bedside chair)?: A Little Help needed to walk in hospital room?: A Little Help needed climbing 3-5 steps with a railing? : A Little 6 Click Score: 20    End of Session Equipment Utilized During Treatment: Gait belt;Oxygen  (2L) Activity Tolerance: Patient limited by fatigue;Patient tolerated treatment well Patient left: with chair alarm set;with call bell/phone within reach Nurse Communication: Mobility status PT Visit Diagnosis: Muscle weakness (generalized) (M62.81);Difficulty in walking, not elsewhere classified (R26.2)    Time: 4562-5638 PT Time Calculation (min) (ACUTE ONLY): 31 min   Charges:   PT Evaluation $PT Eval Low Complexity: 1 Low PT Treatments $Gait Training: 8-22 mins        Kreg Shropshire, DPT 10/24/2021, 1:12 PM

## 2021-10-24 NOTE — Consult Note (Signed)
Medical Consultation   Danielle [name]  YIA:[id number]  DOB: 21-Jul-1956  DOA: 10/23/2021  PCP: [facility] [place]   Outpatient Specialists:   Requesting physician: Herbert Pun, MD  Reason for consultation: AKI/medical management.   History of Present Illness: Danielle [name] is an 66 y.o. female with a past medical history of anemia, anxiety, aortic atherosclerosis, aortic valve stenosis, chronic atrial fibrillation, stage IV CKD, breast cancer with history of chemotherapy, history of radiotherapy, COPD, depression, diastolic dysfunction, GERD, hyperlipidemia, hypertension, lymphedema, small bowel obstruction, vitamin D deficiency who has being in the hospital since yesterday after she came in for colostomy reversal.  She stated she has been lightheaded.  Her blood pressures have been soft.  Nursing staff stated that she has only urinated once today so far.  She is a little tender in her surgical area, but denied fever, sore throat, rhinorrhea, wheezing, hemoptysis, dyspnea, chest pain, nausea, diarrhea, constipation, melena or hematochezia.  No dysuria flank pain, frequency or hematuria.  No polyuria, polydipsia, polyphagia or blurred vision.  Review of Systems:  ROS As per HPI otherwise 10 point review of systems negative.   Past Medical History: Past Medical History:  Diagnosis Date   Anemia    Anxiety    Aortic atherosclerosis ([place])    Aortic valve stenosis 02/10/2018   a.) TTE 02/10/2018: EF 55-60%: mild AS with MPG of 12 mmHg. b.) TTE 04/21/2018: EF 35-40%; mild AS with MPG 8 mmHg. c.) TTE 11/07/2019: EF 50-55%; mild AS with MPG 10 mmHg.   Arthritis    Atrial fibrillation (HCC)    a.) CHA2DS2-VASc = 4 (age, sex, HTN, aortic plaque). b.) Rate/rhythm maintained on oral carvedilol; chronically anticoagulated on dose reduced apixaban. c.)  Attempted deployment of LAA occlusive device on 01/11/2021; parameters failed and procedure aborted.   Breast  cancer, left ([place]) 2000   a.) T2N1M0; ER/PR (+) --> Tx'd with total mastectomy, LN resection, XRT, and chemotherapy   Cancer of right lung ([place]) 07/30/2016   a.) adenocarcinoma; ALK, ROS1, PDL1, BRAF, EGFR all negative.   CKD (chronic kidney disease), stage IV (HCC)    COPD (chronic obstructive pulmonary disease) (HCC)    Dependence on supplemental oxygen    Depression    Diastolic dysfunction 70/78/6754   a.) TTE 02/10/2017: EF 55-60%; G2DD. b.) TTE 04/21/2018: EF 35-40%; mild LA dilation; mod MV regurgitation. c.) TTE 11/07/2019: EF 50-55%; G1DD.   DOE (dyspnea on exertion)    GERD (gastroesophageal reflux disease)    Heart murmur    History of 2019 novel coronavirus disease (COVID-19) 10/14/2019   HLD (hyperlipidemia)    Hypertension    Long term current use of anticoagulant    a.) apixaban   Lymphedema    Personal history of chemotherapy    Personal history of radiation therapy    SBO (small bowel obstruction) ([place]) 11/05/2020   Vitamin D deficiency    Past Surgical History: Past Surgical History:  Procedure Laterality Date   Breast Biospy Left    [facility]   BREAST SURGERY     COLONOSCOPY N/A 04/30/2018   Procedure: COLONOSCOPY;  Surgeon: Virgel Manifold, MD;  Location: [facility] ENDOSCOPY;  Service: Endoscopy;  Laterality: N/A;   COLONOSCOPY N/A 07/22/2018   Procedure: COLONOSCOPY;  Surgeon: Virgel Manifold, MD;  Location: [facility] ENDOSCOPY;  Service: Endoscopy;  Laterality: N/A;   COLONOSCOPY WITH PROPOFOL N/A 09/21/2021   Procedure: COLONOSCOPY WITH  PROPOFOL;  Surgeon: Benjamine Sprague, DO;  Location: [facility] ENDOSCOPY;  Service: General;  Laterality: N/A;   DILATION AND CURETTAGE OF UTERUS     ELECTROMAGNETIC NAVIGATION BROCHOSCOPY Right 04/11/2016   Procedure: ELECTROMAGNETIC NAVIGATION BRONCHOSCOPY;  Surgeon: Vilinda Boehringer, MD;  Location: [facility] ORS;  Service: Cardiopulmonary;  Laterality: Right;   ESOPHAGOGASTRODUODENOSCOPY N/A 07/22/2018   Procedure:  ESOPHAGOGASTRODUODENOSCOPY (EGD);  Surgeon: Virgel Manifold, MD;  Location[facility] END[facility]COPY;  Service: Endoscopy;  Laterality: N/A;   ESOPHAGOGASTRODUODENOSCOPY (EGD) WITH PROPOFOL N/A 05/07/2018   Procedure: ESOPHAGOGASTRODUODENOSCOPY (EGD) WITH PROPOFOL;  Surgeon: Lucilla Lame, MD;  Location: [facility] ENDOSCOPY;  Service: Endoscopy;  Laterality: N/A;   ESOPHAGOGASTRODUODENOSCOPY (EGD) WITH PROPOFOL N/A 04/24/2019   Procedure: ESOPHAGOGASTRODUODENOSCOPY (EGD) WITH PROPOFOL;  Surgeon: Jonathon Bellows, MD;  Location: [facility] ENDOSCOPY;  Service: Gastroenterology;  Laterality: N/A;   ESOPHAGOGASTRODUODENOSCOPY (EGD) WITH PROPOFOL N/A 01/12/2020   Procedure: ESOPHAGOGASTRODUODENOSCOPY (EGD) WITH PROPOFOL;  Surgeon: Jonathon Bellows, MD;  Location: [facility] ENDOSCOPY;  Service: Gastroenterology;  Laterality: N/A;   ESOPHAGOGASTRODUODENOSCOPY (EGD) WITH PROPOFOL N/A 04/28/2020   Procedure: ESOPHAGOGASTRODUODENOSCOPY (EGD) WITH PROPOFOL;  Surgeon: Jonathon Bellows, MD;  Location: [facility] ENDOSCOPY;  Service: Gastroenterology;  Laterality: N/A;   EUS N/A 05/07/2019   Procedure: FULL UPPER ENDOSCOPIC ULTRASOUND (EUS) RADIAL;  Surgeon: Jola Schmidt, MD;  Location: [facility] ENDOSCOPY;  Service: Endoscopy;  Laterality: N/A;   ILEOSCOPY N/A 07/22/2018   Procedure: ILEOSCOPY THROUGH STOMA;  Surgeon: Virgel Manifold, MD;  Location: [facility] ENDOSCOPY;  Service: Endoscopy;  Laterality: N/A;   ILEOSTOMY     ILEOSTOMY N/A 09/08/2018   Procedure: ILEOSTOMY REVISION POSSIBLE CREATION;  Surgeon: Herbert Pun, MD;  Location: [facility] ORS;  Service: General;  Laterality: N/A;   ILEOSTOMY CLOSURE N/A 08/15/2018   Procedure: DILATION OF ILEOSTOMY STRICTURE;  Surgeon: Herbert Pun, MD;  Location: [facility] ORS;  Service: General;  Laterality: N/A;   LAPAROTOMY Right 05/04/2018   Procedure: EXPLORATORY LAPAROTOMY right colectomy right and left ostomy;  Surgeon: Herbert Pun, MD;  Location: [facility] ORS;  Service: General;  Laterality:  Right;   LEFT ATRIAL APPENDAGE OCCLUSION N/A 01/11/2021   Procedure: LEFT ATRIAL APPENDAGE OCCLUSION ([place]); ABORTED PROCEDURE WITHOUT DEVICE BEING IMPLANTED; Location: [facility]; Surgeon: Mylinda Latina, MD   LUNG BIOPSY     MASTECTOMY Left    2000, [place]   ROTATOR CUFF REPAIR Right    [place]   XI ROBOTIC ASSISTED COLOSTOMY TAKEDOWN N/A 10/23/2021   Procedure: XI ROBOTIC ASSISTED ILEOSTOMY TAKEDOWN;  Surgeon: Herbert Pun, MD;  Location: [facility] ORS;  Service: General;  Laterality: N/A;  180 minutes for the surgery part please     Allergies:  No Known Allergies   Social History:  reports that she quit smoking about 9 years ago. Her smoking use included cigarettes. She has a 10.00 pack-year smoking history. Her smokeless tobacco use includes snuff. She reports current alcohol use. She reports that she does not use drugs.   Family History: Family History  Problem Relation Age of Onset   Breast cancer Mother 41   Cancer Mother        Breast    Cirrhosis Father    Breast cancer Paternal Aunt 42   Cancer Maternal Aunt        Breast    Physical Exam: Vitals:   10/23/21 1738 10/23/21 2002 10/24/21 0443 10/24/21 0748  BP: 1_0 92/71  Pulse: 77 77 87 82  Resp: _1 Temp: (!) 97.5 F (36.4 C) 97.7 F (36.5 C) 97.7 F (36.5  C) 98 F (36.7 C)  TempSrc: Oral Oral Oral Oral  SpO2: 98% 98% 96% 100%  Weight:      Height:        Constitutional: Alert and awake, oriented x3, not in any acute distress. Eyes: PERLA, EOMI, irises appear normal, anicteric sclera,  ENMT: external ears and nose appear normal, normal hearing.            Oral mucosa and lips are dry,  posterior pharynx appear normal  Neck: neck appears normal, no masses, normal ROM, no thyromegaly, no JVD  CVS: S1-S2 clear, no murmur rubs or gallops, no LE edema, normal pedal pulses  Respiratory:  clear to auscultation bilaterally, no wheezing, rales or rhonchi. Respiratory effort normal. No  accessory muscle use.  Abdomen: soft, nondistended, mildly tender to palpation, normal bowel sounds, no hepatosplenomegaly, no hernias  Musculoskeletal: : no cyanosis, clubbing or edema noted bilaterally. Neuro: Cranial nerves II-XII intact, strength, sensation, reflexes Psych: judgement and insight appear normal, stable mood and affect, mental status Skin: no rashes or lesions or ulcers, no induration or nodules.  Data reviewed:  I have personally reviewed following labs and imaging studies Labs:  CBC: Recent Labs  Lab 10/24/21 0431  WBC 19.3*  HGB 8.0*  HCT 24.7*  MCV 93.2  PLT 038*   Basic Metabolic Panel: Recent Labs  Lab 10/24/21 0431  NA 129*  K 4.8  CL 108  CO2 15*  GLUCOSE 155*  BUN 32*  CREATININE 2.58*  CALCIUM 8.1*  MG 1.9  PHOS 4.7*   GFR Estimated Creatinine Clearance: 18.8 mL/min (A) (by C-G formula based on SCr of 2.58 mg/dL (H)). Liver Function Tests: No results for input(s): AST, ALT, ALKPHOS, BILITOT, PROT, ALBUMIN in the last 168 hours. No results for input(s): LIPASE, AMYLASE in the last 168 hours. No results for input(s): AMMONIA in the last 168 hours. Coagulation profile No results for input(s): INR, PROTIME in the last 168 hours.  Cardiac Enzymes: No results for input(s): CKTOTAL, CKMB, CKMBINDEX, TROPONINI in the last 168 hours. BNP: Invalid input(s): POCBNP CBG: Recent Labs  Lab 10/23/21 1243  GLUCAP 143*   D-Dimer No results for input(s): DDIMER in the last 72 hours. Hgb A1c No results for input(s): HGBA1C in the last 72 hours. Lipid Profile No results for input(s): CHOL, HDL, LDLCALC, TRIG, CHOLHDL, LDLDIRECT in the last 72 hours. Thyroid function studies No results for input(s): TSH, T4TOTAL, T3FREE, THYROIDAB in the last 72 hours.  Invalid input(s): FREET3 Anemia work up No results for input(s): VITAMINB12, FOLATE, FERRITIN, TIBC, IRON, RETICCTPCT in the last 72 hours.  Sepsis Labs Invalid input(s): PROCALCITONIN,  WBC,   LACTICIDVEN Microbiology Recent Results (from the past 240 hour(s))  SARS CORONAVIRUS 2 (TAT 6-24 HRS) Nasopharyngeal Nasopharyngeal Swab     Status: None   Collection Time: 10/19/21  9:56 AM   Specimen: Nasopharyngeal Swab  Result Value Ref Range Status   SARS Coronavirus 2 NEGATIVE NEGATIVE Final    Comment: (NOTE) SARS-CoV-2 target nucleic acids are NOT DETECTED.  The SARS-CoV-2 RNA is generally detectable in upper and lower respiratory specimens during the acute phase of infection. Negative results do not preclude SARS-CoV-2 infection, do not rule out co-infections with other pathogens, and should not be used as the sole basis for treatment or other patient management decisions. Negative results must be combined with clinical observations, patient history, and epidemiological information. The expected result is Negative.  Fact Sheet for Patients: SugarRoll.be  Fact Sheet for Healthcare Providers:  https://www.woods-mathews.com/  This test is not yet approved or cleared by the [place] and  has been authorized for detection and/or diagnosis of SARS-CoV-2 by FDA under an Emergency Use Authorization (EUA). This EUA will remain  in effect (meaning this test can be used) for the duration of the COVID-19 declaration under Se ction 564(b)(1) of the Act, 21 U.S.C. section 360bbb-3(b)(1), unless the authorization is terminated or revoked sooner.  Performed at [facility], [place] [address], [place], [place] [address]    Inpatient Medications:   Scheduled Meds:  alvimopan  12 mg Oral BID   carvedilol  6.25 mg Oral BH-q7a   citalopram  40 mg Oral BH-q7a   enoxaparin (LOVENOX) injection  40 mg Subcutaneous Q24H   fluticasone furoate-vilanterol  1 puff Inhalation Daily   And   umeclidinium bromide  1 puff Inhalation Daily   gabapentin  300 mg Oral BID   pantoprazole (PROTONIX) IV  40 mg Intravenous QHS   Continuous  Infusions:  sodium chloride 50 mL/hr at 10/24/21 1331   Radiological Exams on Admission: No results found.  Impression/Recommendations Principal Problem:   Ileostomy status (HCC) Analgesics as needed. Antiemetics as needed. Continue postop care per general surgery.  Active Problems:   Hyponatremia Secondary to:  Torsemide use and decreased oral intake. Continue normal saline infusion. Follow-up sodium level in AM.    Acute kidney injury superimposed on CKD (HCC) LR 1000 mL bolus now. Increase NS infusion to 125 mL/h x 16 hours. Reevaluate in a.m. need for IV fluids. Hold antihypertensives. Avoid hypotension. Avoid nephrotoxins. Follow-up renal function electrolytes in AM.    HTN (hypertension) Hold amlodipine. Hold torsemide. Continue carvedilol in a.m. if BP allows. Monitor BP, heart rate, renal function electrolytes.    AF (paroxysmal atrial fibrillation) (HCC) CHA2DS2-VASc = at least 5. On apixaban and carvedilol.    Normocytic anemia Monitor hematocrit and hemoglobin.    Depression Continue escitalopram 40 mg p.o. daily.    Hyperphosphatemia Should resolve with IV hydration.   Follow-up as needed.  Thank you for this consultation.  Our [facility] hospitalist team will follow the patient with you.  Time Spent: About 55 minutes were spent during the process of this consultation.  Reubin Milan M.D. [facility] 10/24/2021, 1:51 PM  This document was prepared using Paramedic and may contain some unintended transcription errors.

## 2021-10-24 NOTE — TOC Initial Note (Signed)
Transition of Care [facility]) - Initial/Assessment Note    Patient Details  Name: Danielle [name] MRN: [id number] Date of Birth: Mar 06, 1956  [facility] CM/SW Contact:    Beverly Sessions, RN Phone Number: 10/24/2021, 3:55 PM  Clinical Narrative:                 Admitted for: ileostomy reversal. Admitted from: Home with granddaughter  [facility]  [facility]    Therapy recommends home health.  Patient declines home health at discharge.  Notified patient should she change her mind to please let us know prior to discharge  Please consult TOC for any needs   Expected Discharge Plan: Home/Self Care Barriers to Discharge: Continued Medical Work up   Patient Goals and CMS Choice        Expected Discharge Plan and Services Expected Discharge Plan: Home/Self Care                                              Prior Living Arrangements/Services   Lives with:: Adult Children Patient language and need for interpreter reviewed:: Yes Do you feel safe going back to the place where you live?: Yes      Need for Family Participation in Patient Care: Yes (Comment)   Current home services: DME Criminal Activity/Legal Involvement Pertinent to Current Situation/Hospitalization: No - Comment as needed  Activities of Daily Living Home Assistive Devices/Equipment: Cane (specify quad or straight), Walker (specify type) (Straight cane or walker when needed for gout flare up) ADL Screening (condition at time of admission) Patient's cognitive ability adequate to safely complete daily activities?: No Is the patient deaf or have difficulty hearing?: No Does the patient have difficulty seeing, even when wearing glasses/contacts?: Yes Does the patient have difficulty concentrating, remembering, or making decisions?: No Patient able to express need for assistance with ADLs?: Yes Does the patient have difficulty dressing or bathing?: No Independently performs  ADLs?: Yes (appropriate for developmental age) Does the patient have difficulty walking or climbing stairs?: No Weakness of Legs: None Weakness of Arms/Hands: Both  Permission Sought/Granted                  Emotional Assessment       Orientation: : Oriented to Self, Oriented to Place, Oriented to  Time, Oriented to Situation Alcohol / Substance Use: Not Applicable Psych Involvement: No (comment)  Admission diagnosis:  Ileostomy status ([place]) [Z93.2] Patient Active Problem List   Diagnosis Date Noted   Hyperphosphatemia 10/24/2021   Hypotension 11/17/2020   Ileostomy status ([place]) 11/17/2020   Acute shoulder pain due to trauma, left 11/17/2020   Syncope 11/17/2020   COPD with acute exacerbation ([place]) 08/01/2020   Atrial fibrillation with rapid ventricular response ([place]) 08/01/2020   Cellulitis of left arm 01/20/2020   CKD (chronic kidney disease), stage IV ([place]) 01/20/2020   Hypokalemia 01/20/2020   Elevated troponin 01/20/2020   Sepsis ([place]) 01/20/2020   Chronic diastolic CHF (congestive heart failure) ([place]) 01/20/2020   CHF exacerbation ([place]) 11/06/2019   Diarrhea    Acute renal failure superimposed on stage 3b chronic kidney disease ([place]) 10/29/2019   GERD (gastroesophageal reflux disease) 10/29/2019   Depression 10/29/2019   Hyperkalemia 10/14/2019   History of 2019 novel coronavirus disease (COVID-19) 10/14/2019   Acute respiratory failure due to COVID-19 ([place]) 09/27/2019   Respiratory tract infection  due to COVID-19 virus 09/27/2019   Acute blood loss anemia 06/20/2019   Normocytic anemia 06/20/2019   GI bleeding 06/19/2019   Anemia of chronic kidney failure, stage 4 (severe) ([place]) 06/12/2019   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   Acute on chronic respiratory failure with hypoxia and hypercapnia (HCC) 12/22/2018   Ileostomy dysfunction ([place]) 09/28/2018   Reflux esophagitis    Iron deficiency anemia secondary to blood loss  (chronic)    SBO (small bowel obstruction) ([place]) 13/24/4010   Chronic systolic heart failure ([place]) 06/09/2018   HTN (hypertension) 06/09/2018   AF (paroxysmal atrial fibrillation) ([place]) 06/09/2018   Lymphedema 06/09/2018   Acute kidney injury superimposed on CKD ([place]) 06/04/2018   Severe protein-calorie malnutrition ([place]) 06/04/2018   Blood in stool    Focal (segmental) acute (reversible) ischemia of large intestine (HCC)    Ulceration of intestine    Diverticulosis of large intestine without diverticulitis    Abnormal CT scan, colon    Colitis    COPD exacerbation ([place])    Malnutrition of moderate degree 04/23/2018   Palliative care by specialist    DNR (do not resuscitate) discussion    Weakness generalized    Hyponatremia 02/10/2017   Syncope and collapse 02/09/2017   Lung nodule, solitary 07/25/2016   Malignant neoplasm of upper lobe of right lung ([place]) 07/25/2016   Carcinoma of overlapping sites of left breast in female, estrogen receptor positive ([place]) 06/22/2016   Multiple lung nodules 06/22/2016   Lesion of right lung    Breast cancer in female [facility]) 02/21/2016   Moderate COPD (chronic obstructive pulmonary disease) ([place]) 08/24/2014   PCP:  [facility] [place]:   [place], [place], [place] [address] Phone: [phone] Fax: [place], [place] - [place] [address] Phone: [phone] Fax: [phone]  [place], [place] - [place] [address] Phone: [phone] Fax: [phone]     Social Determinants of Health (SDOH) Interventions    Readmission Risk Interventions Readmission Risk Prevention Plan 10/16/2019 09/28/2019 06/20/2019  Transportation Screening Complete Complete Complete  Medication Review (RN Care Manager) Referral to Pharmacy Complete  Complete  [place] or Home Care Consult Complete Patient refused Complete  SW Recovery Care/Counseling Consult Complete Complete -  Palliative Care Screening Not Applicable Not Applicable Not Applicable  Skilled Nursing Facility Not Applicable Not Applicable -  Some recent data might be hidden

## 2021-10-24 NOTE — Progress Notes (Signed)
Patient ID: Danielle [name], female   DOB: [birth year], 66 y.o.   MRN: [id number]     [place] Hospital Day(s): 1.   Interval History: Patient seen and examined, no acute events or new complaints overnight. Patient reports feeling sore in the abdomen.  She endorses that she had an episode of hallucination earlier today.  Currently alert and oriented.  Patient was able to ambulate with physical therapy.  She understood that she passed gas but it was a small 1.  Vital signs in last 24 hours: [min-max] current  Temp:  [97.3 F (36.3 C)-98 F (36.7 C)] 97.3 F (36.3 C) (01/10 1557) Pulse Rate:  [73-87] 82 (01/10 1700) Resp:  [16-18] 16 (01/10 1557) BP: (70-92)/(53-71) 80/58 (01/10 1700) SpO2:  [96 %-100 %] 97 % (01/10 1557)     Height: 5\' 6"  (167.6 cm) Weight: 56.2 kg BMI (Calculated): 20.02   Physical Exam:  Constitutional: alert, cooperative and no distress  Respiratory: breathing non-labored at rest  Cardiovascular: Regular rhythm Gastrointestinal: soft, mild-tender, and non-distended.  Wounds are dry and clean.  Labs:  CBC Latest Ref Rng & Units 10/24/2021 10/04/2021 07/27/2021  WBC 4.0 - 10.5 K/uL 19.3(H) - 5.3  Hemoglobin 12.0 - 15.0 g/dL 8.0(L) 10.4(L) 10.3(L)  Hematocrit 36.0 - 46.0 % 24.7(L) 31.9(L) 31.7(L)  Platelets 150 - 400 K/uL 416(H) - 253   CMP Latest Ref Rng & Units 10/24/2021 07/27/2021 05/03/2021  Glucose 70 - 99 mg/dL 155(H) 82 99  BUN 8 - 23 mg/dL 32(H) 22 27(H)  Creatinine 0.44 - 1.00 mg/dL 2.58(H) 1.79(H) 1.71(H)  Sodium 135 - 145 mmol/L 129(L) 138 141  Potassium 3.5 - 5.1 mmol/L 4.8 3.7 4.5  Chloride 98 - 111 mmol/L 108 108 111  CO2 22 - 32 mmol/L 15(L) 20(L) 21(L)  Calcium 8.9 - 10.3 mg/dL 8.1(L) 9.2 9.3  Total Protein 6.5 - 8.1 g/dL - 7.7 -  Total Bilirubin 0.3 - 1.2 mg/dL - 0.6 -  Alkaline Phos 38 - 126 U/L - 76 -  AST 15 - 41 U/L - 21 -  ALT 0 - 44 U/L - 13 -    Imaging studies: No new pertinent imaging studies   Assessment/Plan:  66  y.o. female with history of an ileostomy and mucous fistula 1 Day Post-Op s/p ileostomy reversal, complicated by pertinent comorbidities including COPD, atrial fibrillation on anticoagulation, hypertension, chronic kidney disease stage III.  Status post colostomy reversal -Patient with expected soreness of the abdomen.  Pain controlled with current pain medications. -She endorses that she passed a small gas but feeling to be distended.  No nausea from the clear liquids. -Expected drop in hemoglobin of 2 g after surgery.  Expected elevation of white blood cell count. -Repeat labs in a.m.  Chronic kidney disease stage III -Acute kidney injury after surgery. -Celebrex discontinued.  Torsemide decreased. -Hospitalist consult for assistance in the management of medical comorbidities -Agree with increasing IV fluids hydration. -Repeat labs in a.m.   [place] Long, MD

## 2021-10-24 NOTE — TOC Initial Note (Signed)
Transition of Care [facility]) - Initial/Assessment Note    Patient Details  Name: Danielle [name] MRN: [id number] Date of Birth: [birth year]  Transition of Care [facility]) CM/SW Contact:    Beverly Sessions, RN Phone Number: 10/24/2021, 9:17 AM  Clinical Narrative:                  Transition of Care [facility]) Screening Note   Patient Details  Name: Danielle [name] Date of Birth: Oct 17, 1955   Transition of Care [facility]) CM/SW Contact:    Beverly Sessions, RN Phone Number: 10/24/2021, 9:17 AM    Transition of Care Department [facility]) has reviewed patient and no TOC needs have been identified at this time. We will continue to monitor patient advancement through interdisciplinary progression rounds. If new patient transition needs arise, please place a TOC consult.          Patient Goals and CMS Choice        Expected Discharge Plan and Services                                                Prior Living Arrangements/Services                       Activities of Daily Living Home Assistive Devices/Equipment: Cane (specify quad or straight), Walker (specify type) (Straight cane or walker when needed for gout flare up) ADL Screening (condition at time of admission) Patient's cognitive ability adequate to safely complete daily activities?: No Is the patient deaf or have difficulty hearing?: No Does the patient have difficulty seeing, even when wearing glasses/contacts?: Yes Does the patient have difficulty concentrating, remembering, or making decisions?: No Patient able to express need for assistance with ADLs?: Yes Does the patient have difficulty dressing or bathing?: No Independently performs ADLs?: Yes (appropriate for developmental age) Does the patient have difficulty walking or climbing stairs?: No Weakness of Legs: None Weakness of Arms/Hands: Both  Permission Sought/Granted                  Emotional Assessment              Admission  diagnosis:  Ileostomy status ([place]) [Z93.2] Patient Active Problem List   Diagnosis Date Noted   Hypotension 11/17/2020   Ileostomy status ([place]) 11/17/2020   Acute shoulder pain due to trauma, left 11/17/2020   Syncope 11/17/2020   COPD with acute exacerbation ([place]) 08/01/2020   Atrial fibrillation with rapid ventricular response ([place]) 08/01/2020   Cellulitis of left arm 01/20/2020   CKD (chronic kidney disease), stage IV ([place]) 01/20/2020   Hypokalemia 01/20/2020   Elevated troponin 01/20/2020   Sepsis ([place]) 01/20/2020   Chronic diastolic CHF (congestive heart failure) ([place]) 01/20/2020   CHF exacerbation ([place]) 11/06/2019   Diarrhea    Acute renal failure superimposed on stage 3b chronic kidney disease ([place]) 10/29/2019   GERD (gastroesophageal reflux disease) 10/29/2019   Depression 10/29/2019   Hyperkalemia 10/14/2019   History of 2019 novel coronavirus disease (COVID-19) 10/14/2019   Acute respiratory failure due to COVID-19 ([place]) 09/27/2019   Respiratory tract infection due to COVID-19 virus 09/27/2019   Acute blood loss anemia 06/20/2019   Anemia 06/20/2019   GI bleeding 06/19/2019   Anemia of chronic kidney failure, stage 4 (severe) ([place]) 06/12/2019   Malignant neoplasm of lower third of  esophagus ([place]) 05/01/2019   Acute on chronic respiratory failure with hypoxia and hypercapnia (HCC) 12/22/2018   Ileostomy dysfunction ([place]) 09/28/2018   Reflux esophagitis    Iron deficiency anemia secondary to blood loss (chronic)    SBO (small bowel obstruction) ([place]) 76/73/4193   Chronic systolic heart failure ([place]) 06/09/2018   HTN (hypertension) 06/09/2018   AF (paroxysmal atrial fibrillation) ([place]) 06/09/2018   Lymphedema 06/09/2018   Acute kidney injury superimposed on CKD ([place]) 06/04/2018   Severe protein-calorie malnutrition ([place]) 06/04/2018   Blood in stool    Focal (segmental) acute (reversible) ischemia of large intestine (HCC)    Ulceration of intestine    Diverticulosis  of large intestine without diverticulitis    Abnormal CT scan, colon    Colitis    COPD exacerbation ([place])    Malnutrition of moderate degree 04/23/2018   Palliative care by specialist    DNR (do not resuscitate) discussion    Weakness generalized    Hyponatremia 02/10/2017   Syncope and collapse 02/09/2017   Lung nodule, solitary 07/25/2016   Malignant neoplasm of upper lobe of right lung ([place]) 07/25/2016   Carcinoma of overlapping sites of left breast in female, estrogen receptor positive ([place]) 06/22/2016   Multiple lung nodules 06/22/2016   Lesion of right lung    Breast cancer in female [facility]) 02/21/2016   Moderate COPD (chronic obstructive pulmonary disease) ([place]) 08/24/2014   PCP:  [facility], [place]:   [place], [place], [place] [address] Phone: [phone] Fax: [place], [place] [address] Phone: [phone] Fax: [phone]  [place], [place] [address] Phone: [phone] Fax: [phone]     Social Determinants of Health (SDOH) Interventions    Readmission Risk Interventions Readmission Risk Prevention Plan 10/16/2019 09/28/2019 06/20/2019  Transportation Screening Complete Complete Complete  Medication Review (RN Care Manager) Referral to Pharmacy Complete Complete  HRI or Home Care Consult Complete Patient refused Complete  SW Recovery Care/Counseling Consult Complete Complete -  Palliative Care Screening Not Applicable Not Applicable Not Applicable  Skilled Nursing Facility Not Applicable Not Applicable -  Some recent data might be hidden

## 2021-10-24 NOTE — Progress Notes (Signed)
Admitting physician addendum:  Post 1000 mL LR bolus  BP was 80/58 mmHg. Another 1000 mL LR bolus was ordered. The staff is monitoring her BP closely.   Tennis Must, MD

## 2021-10-24 NOTE — Progress Notes (Signed)
Initial Nutrition Assessment  DOCUMENTATION CODES:   Non-severe (moderate) malnutrition in context of chronic illness  INTERVENTION:   Boost Breeze po TID, each supplement provides 250 kcal and 9 grams of protein  MVI po daily   NUTRITION DIAGNOSIS:   Moderate Malnutrition related to cancer and cancer related treatments as evidenced by mild fat depletion, mild muscle depletion, severe muscle depletion.  GOAL:   Patient will meet greater than or equal to 90% of their needs  MONITOR:   PO intake, Supplement acceptance, Diet advancement, Labs, Weight trends, Skin, I & O's  REASON FOR ASSESSMENT:   Malnutrition Screening Tool    ASSESSMENT:   66 y.o. 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 Pt is well known to this RD from numerous previous admits. Pt reports good appetite and oral intake pta. Pt does not drink supplements at home anymore. Per chart, pt appears weight stable pta. Pt currently on a clear liquid diet. RD discussed with pt the importance of adequate nutrition needed to preserve lean muscle and to support post op healing. Pt is willing to drink peach Boost Breeze and chocolate or vanilla Ensure (prefers butter pecan but this is out of stock). RD will add supplements and MVI to help pt meet her estimated needs.   Medications reviewed and include: celexa, lovenox, MVI, protonix, NaCl @50ml /hr   Labs reviewed: Na  129(L), K 4.8 wnl, BUN 32(H), creat 2.58(H), P 4.7(H), Mg 1.9 wnl Wbc- 19.3(H), Hgb 8.0(L), Hct 24.7(L)  NUTRITION - FOCUSED PHYSICAL EXAM:  Flowsheet Row Most Recent Value  Orbital Region Mild depletion  Upper Arm Region Mild depletion  Thoracic and Lumbar Region No depletion  Buccal Region Mild depletion  Temple Region Mild depletion  Clavicle Bone Region Mild depletion  Clavicle and Acromion Bone Region Mild depletion  Scapular Bone Region Mild depletion  Dorsal Hand Mild depletion  Patellar Region Severe depletion  Anterior Thigh Region Severe depletion  Posterior Calf Region Severe depletion  Edema (RD Assessment) None  Hair Reviewed  Eyes Reviewed  Mouth Reviewed  Skin Reviewed  Nails Reviewed   Diet Order:   Diet Order             Diet clear liquid Room service appropriate? Yes; Fluid consistency: Thin  Diet effective now                  EDUCATION NEEDS:   Education needs have been addressed  Skin:  Skin Assessment: Reviewed RN Assessment (Incision abdomen)  Last BM:  1/9  Height:   Ht Readings from Last 1 Encounters:  10/23/21 5\' 6"  (1.676 m)    Weight:   Wt Readings from Last 1 Encounters:  10/23/21 56.2 kg    Ideal Body Weight:  59 kg  BMI:  Body mass index is 20.01 kg/m.  Estimated Nutritional Needs:   Kcal:  1700-1900kcal/day  Protein:  85-95g/day  Fluid:  1.4-1.7L/day  [name] MS, RD, LDN Please refer to [facility] for RD and/or RD on-call/weekend/after hours pager

## 2021-10-25 ENCOUNTER — Encounter: Payer: Self-pay | Admitting: Internal Medicine

## 2021-10-25 ENCOUNTER — Inpatient Hospital Stay: Payer: Medicare Other

## 2021-10-25 DIAGNOSIS — Z932 Ileostomy status: Secondary | ICD-10-CM | POA: Diagnosis not present

## 2021-10-25 LAB — CBC
HCT: 24.1 % — ABNORMAL LOW (ref 36.0–46.0)
Hemoglobin: 7.3 g/dL — ABNORMAL LOW (ref 12.0–15.0)
MCH: 30 pg (ref 26.0–34.0)
MCHC: 30.3 g/dL (ref 30.0–36.0)
MCV: 99.2 fL (ref 80.0–100.0)
Platelets: 390 10*3/uL (ref 150–400)
RBC: 2.43 MIL/uL — ABNORMAL LOW (ref 3.87–5.11)
RDW: 14.3 % (ref 11.5–15.5)
WBC: 10.2 10*3/uL (ref 4.0–10.5)
nRBC: 0.5 % — ABNORMAL HIGH (ref 0.0–0.2)

## 2021-10-25 LAB — COMPREHENSIVE METABOLIC PANEL
ALT: 9 U/L (ref 0–44)
AST: 19 U/L (ref 15–41)
Albumin: 2.6 g/dL — ABNORMAL LOW (ref 3.5–5.0)
Alkaline Phosphatase: 47 U/L (ref 38–126)
Anion gap: 7 (ref 5–15)
BUN: 32 mg/dL — ABNORMAL HIGH (ref 8–23)
CO2: 14 mmol/L — ABNORMAL LOW (ref 22–32)
Calcium: 7.7 mg/dL — ABNORMAL LOW (ref 8.9–10.3)
Chloride: 110 mmol/L (ref 98–111)
Creatinine, Ser: 3.25 mg/dL — ABNORMAL HIGH (ref 0.44–1.00)
GFR, Estimated: 15 mL/min — ABNORMAL LOW (ref >60)
Glucose, Bld: 108 mg/dL — ABNORMAL HIGH (ref 70–99)
Potassium: 4.6 mmol/L (ref 3.5–5.1)
Sodium: 131 mmol/L — ABNORMAL LOW (ref 135–145)
Total Bilirubin: 0.2 mg/dL — ABNORMAL LOW (ref 0.3–1.2)
Total Protein: 6.5 g/dL (ref 6.5–8.1)

## 2021-10-25 MED ORDER — ENSURE ENLIVE PO LIQD
237.0000 mL | Freq: Three times a day (TID) | ORAL | Status: DC
  Administered 2021-10-25 – 2021-10-26 (×4): 237 mL via ORAL

## 2021-10-25 MED ORDER — ENOXAPARIN SODIUM 30 MG/0.3ML IJ SOSY: 30.0000 mg | PREFILLED_SYRINGE | INTRAMUSCULAR | Status: DC

## 2021-10-25 MED ORDER — SODIUM CHLORIDE 0.9 % IV SOLN: INTRAVENOUS | Status: DC

## 2021-10-25 MED ORDER — HEPARIN SODIUM (PORCINE) 5000 UNIT/ML IJ SOLN
5000.0000 [IU] | Freq: Three times a day (TID) | INTRAMUSCULAR | Status: DC
  Administered 2021-10-25 – 2021-10-26 (×3): 5000 [IU] via SUBCUTANEOUS
  Filled 2021-10-25 (×3): qty 1

## 2021-10-25 MED ORDER — METOPROLOL TARTRATE 25 MG PO TABS
12.5000 mg | ORAL_TABLET | Freq: Two times a day (BID) | ORAL | Status: DC
  Administered 2021-10-25 – 2021-10-26 (×2): 12.5 mg via ORAL
  Filled 2021-10-25 (×3): qty 1

## 2021-10-25 NOTE — Progress Notes (Addendum)
[facility]  ROUNDING NOTE   Subjective:   Danielle [name] 66 year old female with past medical conditions including diabetes, hypertension, lymphedema, breast cancer COPD and chronic kidney disease.  Patient presented for scheduled surgery of robotic assisted ileostomy takedown.  Patient is known to our clinic and is followed outpatient by Dr. Holley Raring.  We have been of consulted to evaluate increased creatinine during this admission.  Baseline creatinine appears to be 2 with GFR 26 on 10/13/2021.  Patient currently resting quietly in bed.  Complains of abdominal pain from procedure.  Placed on full liquid diet, tolerating well.  No edema.  Denies shortness of breath.   Objective:  Vital signs in last 24 hours:  Temp:  [97.3 F (36.3 C)-97.8 F (36.6 C)] 97.4 F (36.3 C) (01/11 0733) Pulse Rate:  [73-91] 83 (01/11 0733) Resp:  [16-20] 18 (01/11 0733) BP: (70-93)/(53-71) 89/56 (01/11 0733) SpO2:  [93 %-99 %] 93 % (01/11 0733)  Weight change:  Filed Weights   10/23/21 0609  Weight: 56.2 kg    Intake/Output: I/O last 3 completed shifts: In: 849.1 [P.O.:420; I.V.:429.1] Out: -    Intake/Output this shift:  Total I/O In: 240 [P.O.:240] Out: -   Physical Exam: General: NAD, resting quietly  Head: Normocephalic, atraumatic. Moist oral mucosal membranes  Eyes: Anicteric  Lungs:  Clear to auscultation, normal effort  Heart: Regular rate and rhythm  Abdomen:  Soft, nontender  Extremities: No peripheral edema.  Neurologic: Nonfocal, moving all four extremities  Skin: No lesions       Basic Metabolic Panel: Recent Labs  Lab 10/24/21 0431 10/25/21 0436  NA 129* 131*  K 4.8 4.6  CL 108 110  CO2 15* 14*  GLUCOSE 155* 108*  BUN 32* 32*  CREATININE 2.58* 3.25*  CALCIUM 8.1* 7.7*  MG 1.9  --   PHOS 4.7*  --     Liver Function Tests: Recent Labs  Lab 10/25/21 0436  AST 19  ALT 9  ALKPHOS 47  BILITOT 0.2*  PROT 6.5  ALBUMIN 2.6*   No results for  input(s): LIPASE, AMYLASE in the last 168 hours. No results for input(s): AMMONIA in the last 168 hours.  CBC: Recent Labs  Lab 10/24/21 0431 10/25/21 0436  WBC 19.3* 10.2  HGB 8.0* 7.3*  HCT 24.7* 24.1*  MCV 93.2 99.2  PLT 416* 390    Cardiac Enzymes: No results for input(s): CKTOTAL, CKMB, CKMBINDEX, TROPONINI in the last 168 hours.  BNP: Invalid input(s): POCBNP  CBG: Recent Labs  Lab 10/23/21 1243  GLUCAP 143*    Microbiology: Results for orders placed or performed during the hospital encounter of 10/19/21  SARS CORONAVIRUS 2 (TAT 6-24 HRS) Nasopharyngeal Nasopharyngeal Swab     Status: None   Collection Time: 10/19/21  9:56 AM   Specimen: Nasopharyngeal Swab  Result Value Ref Range Status   SARS Coronavirus 2 NEGATIVE NEGATIVE Final    Comment: (NOTE) SARS-CoV-2 target nucleic acids are NOT DETECTED.  The SARS-CoV-2 RNA is generally detectable in upper and lower respiratory specimens during the acute phase of infection. Negative results do not preclude SARS-CoV-2 infection, do not rule out co-infections with other pathogens, and should not be used as the sole basis for treatment or other patient management decisions. Negative results must be combined with clinical observations, patient history, and epidemiological information. The expected result is Negative.  Fact Sheet for Patients: SugarRoll.be  Fact Sheet for Healthcare Providers: https://www.woods-mathews.com/  This test is not yet approved or  cleared by the [place] and  has been authorized for detection and/or diagnosis of SARS-CoV-2 by FDA under an Emergency Use Authorization (EUA). This EUA will remain  in effect (meaning this test can be used) for the duration of the COVID-19 declaration under Se ction 564(b)(1) of the Act, 21 U.S.C. section 360bbb-3(b)(1), unless the authorization is terminated or revoked sooner.  Performed at [facility], [place] [address], [place], [place] [address]     Coagulation Studies: No results for input(s): LABPROT, INR in the last 72 hours.  Urinalysis: No results for input(s): COLORURINE, LABSPEC, PHURINE, GLUCOSEU, HGBUR, BILIRUBINUR, KETONESUR, PROTEINUR, UROBILINOGEN, NITRITE, LEUKOCYTESUR in the last 72 hours.  Invalid input(s): APPERANCEUR    Imaging: No results found.   Medications:     citalopram  40 mg Oral BH-q7a   feeding supplement  237 mL Oral TID BM   fluticasone furoate-vilanterol  1 puff Inhalation Daily   And   umeclidinium bromide  1 puff Inhalation Daily   gabapentin  300 mg Oral BID   heparin injection (subcutaneous)  5,000 Units Subcutaneous Q8H   metoprolol tartrate  12.5 mg Oral BID   multivitamin with minerals  1 tablet Oral Daily   pantoprazole (PROTONIX) IV  40 mg Intravenous QHS   albuterol, ondansetron **OR** ondansetron (ZOFRAN) IV, oxyCODONE-acetaminophen  Assessment/ Plan:  [name] is a 67 y.o.  female past medical conditions including diabetes, hypertension, lymphedema, breast cancer COPD and chronic kidney disease.  Patient presented for scheduled surgery of robotic assisted ileostomy takedown.  Acute Kidney Injury on chronic kidney disease stage IV with baseline creatinine 2.03 and GFR of 26 on 09/16/21.  Acute kidney injury secondary to hypotension from procedure Renal ultrasound ordered to evaluate obstruction No IV contrast exposure. No acute need for dialysis at this time.   Continue IVF  Will continue to monitor renal function  Avoid nephrotoxic agents and further hypotension, if possible  Lab Results  Component Value Date   CREATININE 3.25 (H) 10/25/2021   CREATININE 2.58 (H) 10/24/2021   CREATININE 1.79 (H) 07/27/2021    Intake/Output Summary (Last 24 hours) at 10/25/2021 1101 Last data filed at 10/25/2021 1029 Gross per 24 hour  Intake 240 ml  Output --  Net 240 ml   2. Hypertension with chronic kidney  disease. Currently hypotensive. Receiving Metoprolol only. Current BP 89/56  3. Ileostomy reversal, completed on 10/23/21. Pain management. Tolerating full liquid diet.    LOS: 2 [place] 1/11/202311:01 AM

## 2021-10-25 NOTE — Progress Notes (Signed)
Progress Note:    Danielle [name]    FKC:[id number] DOB: 05/24/1956 DOA: 10/23/2021  PCP: [facility] [place]    Brief Narrative:   Danielle [name] is an 66 y.o. female with a past medical history of anemia, anxiety, aortic atherosclerosis, aortic valve stenosis, chronic atrial fibrillation, stage IV CKD, breast cancer with history of chemotherapy, history of radiotherapy, COPD, depression, diastolic dysfunction, GERD, hyperlipidemia, hypertension, lymphedema, small bowel obstruction, vitamin D deficiency who has being in the hospital since yesterday after she came in for colostomy reversal.    Subjective:   Doing well this morning.  No acute complaints.  Passing some gas.  Had a bowel movement last night and this morning.  Blood pressures remain soft.  Creatinine bumping up.  Has some abdominal soreness.  No nausea or vomiting overnight.    Assessment and Plan:   Status post ileostomy reversal: Management per general surgery  AKI on CKD 4: Baseline creatinine appears to be around 1.7-2.0.  We will resume IV fluids as maintenance IV fluids have expired this morning.  Consult nephrology.  Likely secondary to postoperative hypotension and dehydration  Paroxysmal A. fib: We will discontinue Coreg as she is still having soft blood pressures and will start Lopressor.  Home apixaban on hold due to the recent ileostomy reversal.  Depression: Continue home Lexapro  Euvolemic hyponatremia: Plan as above with maintenance IV fluids with NS  Hypertension: Holding home amlodipine and torsemide due to the soft blood pressures and acute kidney injury     Other information:    DVT prophylaxis: Heparin subcu Code Status: Full code Family Communication: None present at bedside Disposition:   Status is: Inpatient  Remains inpatient appropriate because: Due to acute kidney injury           Consultants:   Nephrology, general surgery    Objective:     Vitals:   10/24/21 2054 10/25/21 0411 10/25/21 0733 10/25/21 1557  BP: (!) 82/71 (!) 93/59 (!) 89/56 95/61  Pulse: 78 91 83 95  Resp:  20 18 16   Temp: 97.8 F (36.6 C) 97.8 F (36.6 C) (!) 97.4 F (36.3 C) 97.8 F (36.6 C)  TempSrc: Oral Oral Oral Oral  SpO2: 95% 99% 93% 95%  Weight:      Height:        Intake/Output Summary (Last 24 hours) at 10/25/2021 1659 Last data filed at 10/25/2021 1029 Gross per 24 hour  Intake 240 ml  Output --  Net 240 ml   Filed Weights   10/23/21 0609  Weight: 56.2 kg       Physical Exam:    General exam: Appears calm and comfortable  Respiratory system: Clear to auscultation. Respiratory effort normal. Cardiovascular system: S1 & S2 heard, RRR. No JVD, murmurs, rubs, gallops or clicks. No pedal edema. Gastrointestinal system: Abdomen is nondistended, soft and mild abdominal soreness to palpation. no organomegaly or masses felt. Normal bowel sounds heard.  Dressing clean dry and intact Central nervous system: Alert and oriented. No focal neurological deficits. Extremities: Symmetric 5 x 5 power. Skin: No rashes, lesions or ulcers Psychiatry: Judgement and insight appear normal. Mood & affect appropriate.     Data Reviewed:    I have personally reviewed following labs and imaging studies  CBC: Recent Labs  Lab 10/24/21 0431 10/25/21 0436  WBC 19.3* 10.2  HGB 8.0* 7.3*  HCT 24.7* 24.1*  MCV 93.2 99.2  PLT 416* 749    Basic Metabolic  Panel: Recent Labs  Lab 10/24/21 0431 10/25/21 0436  NA 129* 131*  K 4.8 4.6  CL 108 110  CO2 15* 14*  GLUCOSE 155* 108*  BUN 32* 32*  CREATININE 2.58* 3.25*  CALCIUM 8.1* 7.7*  MG 1.9  --   PHOS 4.7*  --     GFR: Estimated Creatinine Clearance: 14.9 mL/min (A) (by C-G formula based on SCr of 3.25 mg/dL (H)).  Liver Function Tests: Recent Labs  Lab 10/25/21 0436  AST 19  ALT 9  ALKPHOS 47  BILITOT 0.2*  PROT 6.5  ALBUMIN 2.6*    CBG: Recent Labs  Lab  10/23/21 1243  GLUCAP 143*     Recent Results (from the past 240 hour(s))  SARS CORONAVIRUS 2 (TAT 6-24 HRS) Nasopharyngeal Nasopharyngeal Swab     Status: None   Collection Time: 10/19/21  9:56 AM   Specimen: Nasopharyngeal Swab  Result Value Ref Range Status   SARS Coronavirus 2 NEGATIVE NEGATIVE Final    Comment: (NOTE) SARS-CoV-2 target nucleic acids are NOT DETECTED.  The SARS-CoV-2 RNA is generally detectable in upper and lower respiratory specimens during the acute phase of infection. Negative results do not preclude SARS-CoV-2 infection, do not rule out co-infections with other pathogens, and should not be used as the sole basis for treatment or other patient management decisions. Negative results must be combined with clinical observations, patient history, and epidemiological information. The expected result is Negative.  Fact Sheet for Patients: SugarRoll.be  Fact Sheet for Healthcare Providers: https://www.woods-mathews.com/  This test is not yet approved or cleared by the [place] FDA and  has been authorized for detection and/or diagnosis of SARS-CoV-2 by FDA under an Emergency Use Authorization (EUA). This EUA will remain  in effect (meaning this test can be used) for the duration of the COVID-19 declaration under Se ction 564(b)(1) of the Act, 21 U.S.C. section 360bbb-3(b)(1), unless the authorization is terminated or revoked sooner.  Performed at [facility], [place] [address], [place], [place] [address]          Radiology Studies:    US RENAL  Result Date: 10/25/2021 CLINICAL DATA:  Acute kidney injury EXAM: RENAL / URINARY TRACT ULTRASOUND COMPLETE COMPARISON:  CT 11/05/2020 FINDINGS: Right Kidney: Renal measurements: 8.9 x 4.1 x 4.2 cm = volume: 79.8 mL. Cortex slightly echogenic. No mass or hydronephrosis Left Kidney: Renal measurements: 7.5 x 4.4 x 4 cm = volume: 88.5 mL. Cortex slightly  echogenic. No mass or hydronephrosis. Bladder: Appears normal for degree of bladder distention. Other: None. IMPRESSION: Slight increased cortical echogenicity suggesting medical renal disease. No hydronephrosis. Electronically Signed   By: Donavan Foil M.D.   On: 10/25/2021 16:28        Medications:    Scheduled Meds:  citalopram  40 mg Oral BH-q7a   feeding supplement  237 mL Oral TID BM   fluticasone furoate-vilanterol  1 puff Inhalation Daily   And   umeclidinium bromide  1 puff Inhalation Daily   gabapentin  300 mg Oral BID   heparin injection (subcutaneous)  5,000 Units Subcutaneous Q8H   metoprolol tartrate  12.5 mg Oral BID   multivitamin with minerals  1 tablet Oral Daily   pantoprazole (PROTONIX) IV  40 mg Intravenous QHS   Continuous Infusions:     LOS: 2 days    Time spent: 35 minutes    Leslee Home, MD [facility]   To contact the attending provider between 7A-7P or the  covering provider during after hours 7P-7A, please log into the web site [email or web address] and access using universal [place] password for that web site. If you do not have the password, please call the hospital operator.  10/25/2021, 4:59 PM

## 2021-10-25 NOTE — Progress Notes (Signed)
Patient ID: Danielle [name], female   DOB: [birth year], 66 y.o.   MRN: [id number]     [place] Hospital Day(s): 2.   Interval History: Patient seen and examined, no acute events or new complaints overnight. Patient reports still feeling sore.  She is also complaining about intermittent hallucinations.  Denies any nausea or vomiting.  Endorses continue passing gas.  Vital signs in last 24 hours: [min-max] current  Temp:  [97.3 F (36.3 C)-97.8 F (36.6 C)] 97.4 F (36.3 C) (01/11 0733) Pulse Rate:  [73-91] 83 (01/11 0733) Resp:  [16-20] 18 (01/11 0733) BP: (70-93)/(53-71) 89/56 (01/11 0733) SpO2:  [93 %-99 %] 93 % (01/11 0733)     Height: 5\' 6"  (167.6 cm) Weight: 56.2 kg BMI (Calculated): 20.02   Physical Exam:  Constitutional: alert, cooperative and no distress  Respiratory: breathing non-labored at rest  Cardiovascular: regular rate and sinus rhythm  Gastrointestinal: soft, non-tender, and non-distended.  Wounds are dry and clean  Labs:  CBC Latest Ref Rng & Units 10/25/2021 10/24/2021 10/04/2021  WBC 4.0 - 10.5 K/uL 10.2 19.3(H) -  Hemoglobin 12.0 - 15.0 g/dL 7.3(L) 8.0(L) 10.4(L)  Hematocrit 36.0 - 46.0 % 24.1(L) 24.7(L) 31.9(L)  Platelets 150 - 400 K/uL 390 416(H) -   CMP Latest Ref Rng & Units 10/25/2021 10/24/2021 07/27/2021  Glucose 70 - 99 mg/dL 108(H) 155(H) 82  BUN 8 - 23 mg/dL 32(H) 32(H) 22  Creatinine 0.44 - 1.00 mg/dL 3.25(H) 2.58(H) 1.79(H)  Sodium 135 - 145 mmol/L 131(L) 129(L) 138  Potassium 3.5 - 5.1 mmol/L 4.6 4.8 3.7  Chloride 98 - 111 mmol/L 110 108 108  CO2 22 - 32 mmol/L 14(L) 15(L) 20(L)  Calcium 8.9 - 10.3 mg/dL 7.7(L) 8.1(L) 9.2  Total Protein 6.5 - 8.1 g/dL 6.5 - 7.7  Total Bilirubin 0.3 - 1.2 mg/dL 0.2(L) - 0.6  Alkaline Phos 38 - 126 U/L 47 - 76  AST 15 - 41 U/L 19 - 21  ALT 0 - 44 U/L 9 - 13    Imaging studies: No new pertinent imaging studies   Assessment/Plan:  66 y.o. female with history of an ileostomy and mucous fistula 2  Day Post-Op s/p ileostomy reversal, complicated by pertinent comorbidities including COPD, atrial fibrillation on anticoagulation, hypertension, chronic kidney disease stage III.  Status post ileostomy reversal -Patient continues to be alert, sitting down, with, feeling more comfortable but still with significant soreness. -I will discontinue morphine as applicable because of hallucinations - Advance to full liquid as she continue passing gas - Change DVT prophylaxis to Heparin due to increased creatinine  - Continue monitoring Hgb daily. If drop below 7 will consider transfusion due to cardiac history   Chronic kidney disease stage III -Acute kidney injury after surgery.  Today with elevated creatinine up to 3.35 -Celebrex discontinued.  Torsemide discontinued. -DVT prophylaxis changed to heparin -Appreciate hospitalist management of medical comorbidities -Continue with IV fluids hydration. -Repeat labs in a.m.    Arnold Long, MD

## 2021-10-25 NOTE — Progress Notes (Signed)
PHARMACIST - PHYSICIAN COMMUNICATION  CONCERNING:  Enoxaparin (Lovenox) for DVT Prophylaxis    RECOMMENDATION: Patient was prescribed enoxaprin 40mg  q24 hours for VTE prophylaxis.   Filed Weights   10/23/21 0609  Weight: 56.2 kg (124 lb)    Body mass index is 20.01 kg/m.  Estimated Creatinine Clearance: 14.9 mL/min (A) (by C-G formula based on SCr of 3.25 mg/dL (H)).  Patient is candidate for enoxaparin 30mg  every 24 hours based on CrCl <66ml/min or Weight <45kg  DESCRIPTION: Pharmacy has adjusted enoxaparin dose per [facility] policy.  Patient is now receiving enoxaparin 30 mg every 24 hours.  Renda Rolls, PharmD, [facility] 10/25/2021 6:20 AM

## 2021-10-26 ENCOUNTER — Inpatient Hospital Stay: Payer: Medicare Other

## 2021-10-26 DIAGNOSIS — Z932 Ileostomy status: Secondary | ICD-10-CM | POA: Diagnosis not present

## 2021-10-26 LAB — CBC WITH DIFFERENTIAL/PLATELET
Abs Immature Granulocytes: 0.1 10*3/uL — ABNORMAL HIGH (ref 0.00–0.07)
Basophils Absolute: 0.1 10*3/uL (ref 0.0–0.1)
Basophils Relative: 1 %
Eosinophils Absolute: 0.2 10*3/uL (ref 0.0–0.5)
Eosinophils Relative: 2 %
HCT: 25.7 % — ABNORMAL LOW (ref 36.0–46.0)
Hemoglobin: 8 g/dL — ABNORMAL LOW (ref 12.0–15.0)
Immature Granulocytes: 1 %
Lymphocytes Relative: 12 %
Lymphs Abs: 1 10*3/uL (ref 0.7–4.0)
MCH: 30.4 pg (ref 26.0–34.0)
MCHC: 31.1 g/dL (ref 30.0–36.0)
MCV: 97.7 fL (ref 80.0–100.0)
Monocytes Absolute: 0.6 10*3/uL (ref 0.1–1.0)
Monocytes Relative: 6 %
Neutro Abs: 6.8 10*3/uL (ref 1.7–7.7)
Neutrophils Relative %: 78 %
Platelets: 411 10*3/uL — ABNORMAL HIGH (ref 150–400)
RBC: 2.63 MIL/uL — ABNORMAL LOW (ref 3.87–5.11)
RDW: 14.7 % (ref 11.5–15.5)
WBC: 8.7 10*3/uL (ref 4.0–10.5)
nRBC: 2.5 % — ABNORMAL HIGH (ref 0.0–0.2)

## 2021-10-26 LAB — BLOOD GAS, ARTERIAL
Acid-base deficit: 10 mmol/L — ABNORMAL HIGH (ref 0.0–2.0)
Bicarbonate: 16.8 mmol/L — ABNORMAL LOW (ref 20.0–28.0)
FIO2: 0.32
O2 Saturation: 88.8 %
Patient temperature: 37
pCO2 arterial: 40 mmHg (ref 32.0–48.0)
pH, Arterial: 7.23 — ABNORMAL LOW (ref 7.350–7.450)
pO2, Arterial: 67 mmHg — ABNORMAL LOW (ref 83.0–108.0)

## 2021-10-26 LAB — GLUCOSE, CAPILLARY
Glucose-Capillary: 89 mg/dL (ref 70–99)
Glucose-Capillary: 104 mg/dL — ABNORMAL HIGH (ref 70–99)

## 2021-10-26 LAB — RENAL FUNCTION PANEL
Albumin: 2.5 g/dL — ABNORMAL LOW (ref 3.5–5.0)
Anion gap: 5 (ref 5–15)
BUN: 33 mg/dL — ABNORMAL HIGH (ref 8–23)
CO2: 16 mmol/L — ABNORMAL LOW (ref 22–32)
Calcium: 7.8 mg/dL — ABNORMAL LOW (ref 8.9–10.3)
Chloride: 110 mmol/L (ref 98–111)
Creatinine, Ser: 3 mg/dL — ABNORMAL HIGH (ref 0.44–1.00)
GFR, Estimated: 17 mL/min — ABNORMAL LOW (ref >60)
Glucose, Bld: 92 mg/dL (ref 70–99)
Phosphorus: 3.2 mg/dL (ref 2.5–4.6)
Potassium: 4.6 mmol/L (ref 3.5–5.1)
Sodium: 131 mmol/L — ABNORMAL LOW (ref 135–145)

## 2021-10-26 LAB — BASIC METABOLIC PANEL
Anion gap: 7 (ref 5–15)
BUN: 35 mg/dL — ABNORMAL HIGH (ref 8–23)
CO2: 18 mmol/L — ABNORMAL LOW (ref 22–32)
Calcium: 8.3 mg/dL — ABNORMAL LOW (ref 8.9–10.3)
Chloride: 109 mmol/L (ref 98–111)
Creatinine, Ser: 2.79 mg/dL — ABNORMAL HIGH (ref 0.44–1.00)
GFR, Estimated: 18 mL/min — ABNORMAL LOW (ref >60)
Glucose, Bld: 109 mg/dL — ABNORMAL HIGH (ref 70–99)
Potassium: 5 mmol/L (ref 3.5–5.1)
Sodium: 134 mmol/L — ABNORMAL LOW (ref 135–145)

## 2021-10-26 LAB — HEMOGLOBIN AND HEMATOCRIT, BLOOD
HCT: 22.2 % — ABNORMAL LOW (ref 36.0–46.0)
Hemoglobin: 7.2 g/dL — ABNORMAL LOW (ref 12.0–15.0)

## 2021-10-26 LAB — TROPONIN I (HIGH SENSITIVITY)
Troponin I (High Sensitivity): 52 ng/L — ABNORMAL HIGH (ref <18)
Troponin I (High Sensitivity): 47 ng/L — ABNORMAL HIGH (ref <18)

## 2021-10-26 MED ORDER — MIDODRINE HCL 5 MG PO TABS
10.0000 mg | ORAL_TABLET | Freq: Three times a day (TID) | ORAL | Status: DC
  Administered 2021-10-26 – 2021-10-28 (×2): 10 mg via ORAL
  Filled 2021-10-26 (×3): qty 2

## 2021-10-26 MED ORDER — ALBUMIN HUMAN 25 % IV SOLN
12.5000 g | Freq: Once | INTRAVENOUS | Status: AC
  Administered 2021-10-26: 12.5 g via INTRAVENOUS
  Filled 2021-10-26: qty 50

## 2021-10-26 MED ORDER — SODIUM BICARBONATE 8.4 % IV SOLN
50.0000 meq | Freq: Once | INTRAVENOUS | Status: AC
  Administered 2021-10-26: 50 meq via INTRAVENOUS
  Filled 2021-10-26: qty 50

## 2021-10-26 MED ORDER — ALBUMIN HUMAN 25 % IV SOLN
50.0000 g | Freq: Once | INTRAVENOUS | Status: AC
  Administered 2021-10-26: 50 g via INTRAVENOUS
  Filled 2021-10-26: qty 200

## 2021-10-26 MED ORDER — APIXABAN 2.5 MG PO TABS
2.5000 mg | ORAL_TABLET | Freq: Two times a day (BID) | ORAL | Status: DC
  Administered 2021-10-26 (×2): 2.5 mg via ORAL
  Filled 2021-10-26 (×3): qty 1

## 2021-10-26 MED ORDER — STERILE WATER FOR INJECTION IV SOLN
INTRAVENOUS | Status: DC
  Filled 2021-10-26 (×3): qty 150

## 2021-10-26 MED ORDER — SODIUM CHLORIDE 0.9 % IV BOLUS
1000.0000 mL | Freq: Once | INTRAVENOUS | Status: AC
  Administered 2021-10-26: 1000 mL via INTRAVENOUS

## 2021-10-26 NOTE — Progress Notes (Signed)
° °      CROSS COVER NOTE  NAME: Danielle [name] MRN: [id number] DOB : 04-Dec-1955   Secure chat received from nursing reporting (L) sided arm and leg drift. Rapid response RN sent to bedside to evaluate patient. I later presented to bedside and no focal neurological deficits noted. Patient has bilateral weakness. Dr Peyton Najjar also at bedside to assess patient.   Neomia Glass MHA, MSN, FNP-BC Nurse Practitioner [facility] Pager [phone]

## 2021-10-26 NOTE — Progress Notes (Signed)
Ultrasound of entire Rt arm anterior and posterior - nothing visualized for IV access below A/C. Placed # 20, 2.5" catheter Rt upper inner arm . Pt has a restriction on her Lt arm from HX of mastectomy on Lt side.

## 2021-10-26 NOTE — Progress Notes (Signed)
Patient ID: Danielle [name], female   DOB: 1956/10/11, 66 y.o.   MRN: [id number]     [place] Hospital Day(s): 3.   Interval History: Patient seen and examined, no acute events or new complaints overnight. Patient reports feeling weaker today.  Patient has been alert and oriented.  She endorses having weakness in all of her extremities.  As per nurse patient seems weaker than usual.  Yesterday she was up and ambulating and sitting down in a commode throughout the day.  Denies any fascial drop.  Patient tolerating complete sentences.  No distress.  Patient has had multiple bowel movements.  No nausea or vomiting.  Blood pressure improved  Vital signs in last 24 hours: [min-max] current  Temp:  [97.4 F (36.3 C)-98.4 F (36.9 C)] 97.7 F (36.5 C) (01/12 0628) Pulse Rate:  [83-98] 98 (01/12 0628) Resp:  [15-18] 18 (01/12 0628) BP: (89-104)/(56-73) 104/73 (01/12 0628) SpO2:  [91 %-95 %] 94 % (01/12 0628)     Height: 5\' 6"  (167.6 cm) Weight: 56.2 kg BMI (Calculated): 20.02   Physical Exam:  Constitutional: alert, cooperative and no distress  Respiratory: breathing non-labored at rest  Cardiovascular: Irregular rhythm Gastrointestinal: soft, non-tender, and non-distended.  Wounds are dry and clean  Labs:  CBC Latest Ref Rng & Units 10/26/2021 10/25/2021 10/24/2021  WBC 4.0 - 10.5 K/uL - 10.2 19.3(H)  Hemoglobin 12.0 - 15.0 g/dL 7.2(L) 7.3(L) 8.0(L)  Hematocrit 36.0 - 46.0 % 22.2(L) 24.1(L) 24.7(L)  Platelets 150 - 400 K/uL - 390 416(H)   CMP Latest Ref Rng & Units 10/26/2021 10/25/2021 10/24/2021  Glucose 70 - 99 mg/dL 92 108(H) 155(H)  BUN 8 - 23 mg/dL 33(H) 32(H) 32(H)  Creatinine 0.44 - 1.00 mg/dL 3.00(H) 3.25(H) 2.58(H)  Sodium 135 - 145 mmol/L 131(L) 131(L) 129(L)  Potassium 3.5 - 5.1 mmol/L 4.6 4.6 4.8  Chloride 98 - 111 mmol/L 110 110 108  CO2 22 - 32 mmol/L 16(L) 14(L) 15(L)  Calcium 8.9 - 10.3 mg/dL 7.8(L) 7.7(L) 8.1(L)  Total Protein 6.5 - 8.1 g/dL - 6.5 -  Total  Bilirubin 0.3 - 1.2 mg/dL - 0.2(L) -  Alkaline Phos 38 - 126 U/L - 47 -  AST 15 - 41 U/L - 19 -  ALT 0 - 44 U/L - 9 -    Imaging studies: No new pertinent imaging studies   Assessment/Plan:  66 y.o. female with history of an ileostomy and mucous fistula 3 Day Post-Op s/p ileostomy reversal, complicated by pertinent comorbidities including COPD, atrial fibrillation on anticoagulation, hypertension, chronic kidney disease stage III.   Status post ileostomy reversal -Having multiple bowel movement.  This is expected after long-term the use of the distal colon.  No nausea or vomiting and tolerating full liquids - Advance to soft diet -We will restart Eliquis since hemoglobin was stable - Continue monitoring Hgb daily.    Chronic kidney disease stage III -Acute kidney injury after surgery.  Today with cephalization and slightly decrease in creatinine -Appreciate hospitalist management of medical comorbidities -We will start decreasing IV fluid hydration if patient continue tolerating diet and drinking water. -Repeat labs in a.m.  Arnold Long, MD

## 2021-10-26 NOTE — Progress Notes (Signed)
Checked on patient per [place] RN's request as rapid response RN due to stroke like symptoms overnight. Early in the morning patient had decreased use of left arm and leg. By the time of this assessment patient had some weakness bilaterally but was the same on both sides and could move all extremities. No speech issues noticed, alert and oriented times four, and no facial drop noticed. Patient did report she had some double vision which resulted in her having difficulty reading but that it started yesterday. CBG 89. BP actually increased overnight to SBP in 100s and MAP consistently in 70s or 80s overnight. Patient's nurse already notified Dr. Windell Moment and Neomia Glass NP.

## 2021-10-26 NOTE — Progress Notes (Signed)
Patient ID: RAELEE ROSSMANN, female   DOB: 08-25-1956, 66 y.o.   MRN: [id number]     SURGERY FOLLOW UP  Patient reevaluated.  She has been intermittently lethargic.  She is arousable and talking and asking question and responding sense.  Due to changes in her mental status and low blood pressure cardiac and infectious work-up was started.  No significant finding on chest or abdominal x-ray.  There is no free air.  Hemoglobin stable.  No decrease.  No concern of bleeding.  Creatinine trending down.  White blood cell within normal limits.  Troponin elevated but patient without chest pain and EKG shows A. fib with adequate heart rate.  No significant acute ischemic changes.  ABGs shows metabolic acidosis.  Hospitalist ordering bicarb.  Critical care team consulted and agreed with bicarb infusion.  Patient was going to be started on midodrine.  I updated daughter about patient status.  Herbert Pun, MD

## 2021-10-26 NOTE — Progress Notes (Signed)
Progress Note:    Danielle [name]    DVV:[id number] DOB: 1956-09-04 DOA: 10/23/2021  PCP: [facility] [place]    Brief Narrative:   Danielle [name] is an 66 y.o. female with a past medical history of anemia, anxiety, aortic atherosclerosis, aortic valve stenosis, chronic atrial fibrillation, stage IV CKD, breast cancer with history of chemotherapy, history of radiotherapy, COPD, depression, diastolic dysfunction, GERD, hyperlipidemia, hypertension, lymphedema, small bowel obstruction, vitamin D deficiency who presented to the hospital for an colostomy reversal    Subjective:   Doing well this morning.  No acute complaints.  Passing gas.  Had a bowel movement last night.  Blood pressure improved. Creatinine improving.  Has some abdominal soreness.  No nausea or vomiting overnight.    Assessment and Plan:   Status post ileostomy reversal: Management per general surgery  AKI on CKD 4: Baseline creatinine appears to be around 1.7-2.0.  Continue MIVF with NS. Nephrology consulted and following.  Likely secondary to postoperative hypotension and dehydration. Will give some albumin to help with hypotension. Will give 1 amp of sodium bicarb to help with metabolic acidosis.  Paroxysmal A. fib: Home Coreg discontinued. Now on Lopressor. Blood pressures improved. Home apixaban resumed by G/S.  Depression: Continue home Lexapro  Hypovolemic hyponatremia: Plan as above with maintenance IV fluids with NS  Hypertension: Holding home amlodipine and torsemide due to the soft blood pressures and acute kidney injury     Other information:    DVT prophylaxis: Apixaban Code Status: Full code    Consultants:   Nephrology, general surgery    Objective:    Vitals:   10/26/21 0628 10/26/21 0912 10/26/21 1538 10/26/21 1615  BP: 104/73 123/77 112/73 (!) 89/68  Pulse: 98 (!) 101 81 93  Resp: 18 18 18 16   Temp: 97.7 F (36.5 C) 98.5 F (36.9 C) 98.3 F (36.8 C) 97.9  F (36.6 C)  TempSrc:  Oral  Oral  SpO2: 94% 97% 91% 93%  Weight:      Height:        Intake/Output Summary (Last 24 hours) at 10/26/2021 1632 Last data filed at 10/26/2021 1030 Gross per 24 hour  Intake 600 ml  Output 500 ml  Net 100 ml    Filed Weights   10/23/21 0609  Weight: 56.2 kg       Physical Exam:    General exam: Appears calm and comfortable  Respiratory system: Clear to auscultation. Respiratory effort normal. Cardiovascular system: S1 & S2 heard, RRR. No JVD, murmurs, rubs, gallops or clicks. No pedal edema. Gastrointestinal system: Abdomen is nondistended, soft and mild abdominal soreness to palpation. no organomegaly or masses felt. Normal bowel sounds heard.  Dressing clean dry and intact Central nervous system: Alert and oriented. No focal neurological deficits. Extremities: Symmetric 5 x 5 power. Skin: No rashes, lesions or ulcers Psychiatry: Judgement and insight appear normal. Mood & affect appropriate.     Data Reviewed:    I have personally reviewed following labs and imaging studies  CBC: Recent Labs  Lab 10/24/21 0431 10/25/21 0436 10/26/21 0429  WBC 19.3* 10.2  --   HGB 8.0* 7.3* 7.2*  HCT 24.7* 24.1* 22.2*  MCV 93.2 99.2  --   PLT 416* 390  --      Basic Metabolic Panel: Recent Labs  Lab 10/24/21 0431 10/25/21 0436 10/26/21 0429  NA 129* 131* 131*  K 4.8 4.6 4.6  CL 108 110 110  CO2 15* 14*  16*  GLUCOSE 155* 108* 92  BUN 32* 32* 33*  CREATININE 2.58* 3.25* 3.00*  CALCIUM 8.1* 7.7* 7.8*  MG 1.9  --   --   PHOS 4.7*  --  3.2     GFR: Estimated Creatinine Clearance: 16.1 mL/min (A) (by C-G formula based on SCr of 3 mg/dL (H)).  Liver Function Tests: Recent Labs  Lab 10/25/21 0436 10/26/21 0429  AST 19  --   ALT 9  --   ALKPHOS 47  --   BILITOT 0.2*  --   PROT 6.5  --   ALBUMIN 2.6* 2.5*     CBG: Recent Labs  Lab 10/23/21 1243 10/26/21 0708  GLUCAP 143* 89      Recent Results (from the past 240  hour(s))  SARS CORONAVIRUS 2 (TAT 6-24 HRS) Nasopharyngeal Nasopharyngeal Swab     Status: None   Collection Time: 10/19/21  9:56 AM   Specimen: Nasopharyngeal Swab  Result Value Ref Range Status   SARS Coronavirus 2 NEGATIVE NEGATIVE Final    Comment: (NOTE) SARS-CoV-2 target nucleic acids are NOT DETECTED.  The SARS-CoV-2 RNA is generally detectable in upper and lower respiratory specimens during the acute phase of infection. Negative results do not preclude SARS-CoV-2 infection, do not rule out co-infections with other pathogens, and should not be used as the sole basis for treatment or other patient management decisions. Negative results must be combined with clinical observations, patient history, and epidemiological information. The expected result is Negative.  Fact Sheet for Patients: SugarRoll.be  Fact Sheet for Healthcare Providers: https://www.woods-mathews.com/  This test is not yet approved or cleared by the [place] FDA and  has been authorized for detection and/or diagnosis of SARS-CoV-2 by FDA under an Emergency Use Authorization (EUA). This EUA will remain  in effect (meaning this test can be used) for the duration of the COVID-19 declaration under Se ction 564(b)(1) of the Act, 21 U.S.C. section 360bbb-3(b)(1), unless the authorization is terminated or revoked sooner.  Performed at [facility], [place] [address], [place], [place] [address]           Radiology Studies:    US RENAL  Result Date: 10/25/2021 CLINICAL DATA:  Acute kidney injury EXAM: RENAL / URINARY TRACT ULTRASOUND COMPLETE COMPARISON:  CT 11/05/2020 FINDINGS: Right Kidney: Renal measurements: 8.9 x 4.1 x 4.2 cm = volume: 79.8 mL. Cortex slightly echogenic. No mass or hydronephrosis Left Kidney: Renal measurements: 7.5 x 4.4 x 4 cm = volume: 88.5 mL. Cortex slightly echogenic. No mass or hydronephrosis. Bladder: Appears normal for degree of  bladder distention. Other: None. IMPRESSION: Slight increased cortical echogenicity suggesting medical renal disease. No hydronephrosis. Electronically Signed   By: Donavan Foil M.D.   On: 10/25/2021 16:28        Medications:    Scheduled Meds:  apixaban  2.5 mg Oral BID   citalopram  40 mg Oral [id number]   feeding supplement  237 mL Oral TID BM   fluticasone furoate-vilanterol  1 puff Inhalation Daily   And   umeclidinium bromide  1 puff Inhalation Daily   gabapentin  300 mg Oral BID   metoprolol tartrate  12.5 mg Oral BID   multivitamin with minerals  1 tablet Oral Daily   pantoprazole (PROTONIX) IV  40 mg Intravenous QHS   Continuous Infusions:  sodium chloride 50 mL/hr at 10/26/21 1043       LOS: 3 days    Time spent: 35 minutes  Leslee Home, MD [facility]   To contact the attending provider between 7A-7P or the covering provider during after hours 7P-7A, please log into the web site [email or web address] and access using universal [place] password for that web site. If you do not have the password, please call the hospital operator.  10/26/2021, 4:32 PM

## 2021-10-26 NOTE — Care Management Important Message (Signed)
Important Message  Patient Details  Name: Danielle [name] MRN: [id number] Date of Birth: 10/19/1955   Medicare Important Message Given:  N/A - LOS <3 / Initial given by admissions  Initial Medicare IM reviewed with patient via room phone by Thornton Dales, Patient Access Associate, on 10/26/2021 at 8:35am.    [name] 10/26/2021, 11:46 AM

## 2021-10-26 NOTE — Progress Notes (Signed)
PT Cancellation Note  Patient Details Name: Danielle [name] MRN: [id number] DOB: September 05, 1956   Cancelled Treatment:    Reason Eval/Treat Not Completed: Other (comment) Rapid response called on pt early this AM. Late morning spoke with nurse who clears her for activity and attempted to see her before lunch. She was lethargic but interactive, reported she could not do anything at that time but willing to try if I came back this afternoon.  On attempt to see her this afternoon a lot of staff was in the room and nursing said "No PT right now."  Later found out that this was yet another rapid response.  Will continue to follow from a distance at this time.  Kreg Shropshire, DPT 10/26/2021, 5:30 PM

## 2021-10-26 NOTE — Progress Notes (Addendum)
Pt reports double vision and request to use the bathroom. This nurse attempts to assist to the [facility], however the patient is unable to get up from the bed. Vitals are taken and WNL and her LOC remains at her baseline,however her left side is progressively weaker than her right side. On call provider Neomia Glass, NP is notified and rapid response is called to the bedside. Rapid response Nurse and Provider's [place] bedside assessment did not warrant any additional testing at this time. CBG is 89. Dr. Peyton Najjar is at the bedside at this time and his assessement also did not warrant any additional testing. Patient will continue to be monitored for any progressive weakness.

## 2021-10-26 NOTE — Progress Notes (Addendum)
I was called to evaluate for low BP after patient was given Beta blockers Most recent BP 110/79 MAP 89 Recommend Midodrine 10 mg TID(I have ordered already) Blood reports c/w renal dysfunction with low bicarb-start bicarb infusion

## 2021-10-26 NOTE — Progress Notes (Signed)
Called by [facility] charge RN, Minette Brine, to come see patient as rapid response RN. Rapid response also saw patient earlier today for stroke like symptoms. I was called this time for lethargy and weakness. Pt was very weak on my assessment. She could lift both arms evenly but could not hold them up very long. Grips were equal but very weak. Pt could move toes on both sides. Pupils equal and responsive. Pt is oriented but is lethargic and can barely hold her eyes open for my assessment. SBP initially in the 80s on my arrival but recheck BP had improved with a MAP of 84. Dr. Peyton Najjar and hospitalist was notified and Dr. Peyton Najjar had arrived at bedside. Labs, ABG, and EKG ordered.

## 2021-10-26 NOTE — Progress Notes (Signed)
[facility]  ROUNDING NOTE   Subjective:   Danielle [name] 66 year old female with past medical conditions including diabetes, hypertension, lymphedema, breast cancer COPD and chronic kidney disease.  Patient presented for scheduled surgery of robotic assisted ileostomy takedown.  Patient is known to our clinic and is followed outpatient by Dr. Holley Raring.    Patient resting comfortably Alert and oriented Tolerating meals  No edema   Objective:  Vital signs in last 24 hours:  Temp:  [97.7 F (36.5 C)-98.5 F (36.9 C)] 98.5 F (36.9 C) (01/12 0912) Pulse Rate:  [94-101] 101 (01/12 0912) Resp:  [15-18] 18 (01/12 0912) BP: (95-123)/(58-77) 123/77 (01/12 0912) SpO2:  [91 %-97 %] 97 % (01/12 0912)  Weight change:  Filed Weights   10/23/21 0609  Weight: 56.2 kg    Intake/Output: I/O last 3 completed shifts: In: 480 [P.O.:480] Out: -    Intake/Output this shift:  Total I/O In: -  Out: 500 [Urine:500]  Physical Exam: General: NAD, resting quietly  Head: Normocephalic, atraumatic. Moist oral mucosal membranes  Eyes: Anicteric  Lungs:  Clear to auscultation, normal effort  Heart: Regular rate and rhythm  Abdomen:  Soft, nontender  Extremities: No peripheral edema.  Neurologic: Nonfocal, moving all four extremities  Skin: No lesions       Basic Metabolic Panel: Recent Labs  Lab 10/24/21 0431 10/25/21 0436 10/26/21 0429  NA 129* 131* 131*  K 4.8 4.6 4.6  CL 108 110 110  CO2 15* 14* 16*  GLUCOSE 155* 108* 92  BUN 32* 32* 33*  CREATININE 2.58* 3.25* 3.00*  CALCIUM 8.1* 7.7* 7.8*  MG 1.9  --   --   PHOS 4.7*  --  3.2     Liver Function Tests: Recent Labs  Lab 10/25/21 0436 10/26/21 0429  AST 19  --   ALT 9  --   ALKPHOS 47  --   BILITOT 0.2*  --   PROT 6.5  --   ALBUMIN 2.6* 2.5*    No results for input(s): LIPASE, AMYLASE in the last 168 hours. No results for input(s): AMMONIA in the last 168 hours.  CBC: Recent Labs  Lab  10/24/21 0431 10/25/21 0436 10/26/21 0429  WBC 19.3* 10.2  --   HGB 8.0* 7.3* 7.2*  HCT 24.7* 24.1* 22.2*  MCV 93.2 99.2  --   PLT 416* 390  --      Cardiac Enzymes: No results for input(s): CKTOTAL, CKMB, CKMBINDEX, TROPONINI in the last 168 hours.  BNP: Invalid input(s): POCBNP  CBG: Recent Labs  Lab 10/23/21 1243 10/26/21 0708  GLUCAP 143* 89     Microbiology: Results for orders placed or performed during the hospital encounter of 10/19/21  SARS CORONAVIRUS 2 (TAT 6-24 HRS) Nasopharyngeal Nasopharyngeal Swab     Status: None   Collection Time: 10/19/21  9:56 AM   Specimen: Nasopharyngeal Swab  Result Value Ref Range Status   SARS Coronavirus 2 NEGATIVE NEGATIVE Final    Comment: (NOTE) SARS-CoV-2 target nucleic acids are NOT DETECTED.  The SARS-CoV-2 RNA is generally detectable in upper and lower respiratory specimens during the acute phase of infection. Negative results do not preclude SARS-CoV-2 infection, do not rule out co-infections with other pathogens, and should not be used as the sole basis for treatment or other patient management decisions. Negative results must be combined with clinical observations, patient history, and epidemiological information. The expected result is Negative.  Fact Sheet for Patients: SugarRoll.be  Fact Sheet for Healthcare  Providers: https://www.woods-mathews.com/  This test is not yet approved or cleared by the [place] and  has been authorized for detection and/or diagnosis of SARS-CoV-2 by FDA under an Emergency Use Authorization (EUA). This EUA will remain  in effect (meaning this test can be used) for the duration of the COVID-19 declaration under Se ction 564(b)(1) of the Act, 21 U.S.C. section 360bbb-3(b)(1), unless the authorization is terminated or revoked sooner.  Performed at [facility], [place] [address], [place], [place] [address]     Coagulation  Studies: No results for input(s): LABPROT, INR in the last 72 hours.  Urinalysis: No results for input(s): COLORURINE, LABSPEC, PHURINE, GLUCOSEU, HGBUR, BILIRUBINUR, KETONESUR, PROTEINUR, UROBILINOGEN, NITRITE, LEUKOCYTESUR in the last 72 hours.  Invalid input(s): APPERANCEUR    Imaging: US RENAL  Result Date: 10/25/2021 CLINICAL DATA:  Acute kidney injury EXAM: RENAL / URINARY TRACT ULTRASOUND COMPLETE COMPARISON:  CT 11/05/2020 FINDINGS: Right Kidney: Renal measurements: 8.9 x 4.1 x 4.2 cm = volume: 79.8 mL. Cortex slightly echogenic. No mass or hydronephrosis Left Kidney: Renal measurements: 7.5 x 4.4 x 4 cm = volume: 88.5 mL. Cortex slightly echogenic. No mass or hydronephrosis. Bladder: Appears normal for degree of bladder distention. Other: None. IMPRESSION: Slight increased cortical echogenicity suggesting medical renal disease. No hydronephrosis. Electronically Signed   By: Donavan Foil M.D.   On: 10/25/2021 16:28     Medications:    sodium chloride 100 mL/hr at 10/25/21 1815   albumin human      apixaban  2.5 mg Oral BID   citalopram  40 mg Oral BH-q7a   feeding supplement  237 mL Oral TID BM   fluticasone furoate-vilanterol  1 puff Inhalation Daily   And   umeclidinium bromide  1 puff Inhalation Daily   gabapentin  300 mg Oral BID   metoprolol tartrate  12.5 mg Oral BID   multivitamin with minerals  1 tablet Oral Daily   pantoprazole (PROTONIX) IV  40 mg Intravenous QHS   sodium bicarbonate  50 mEq Intravenous Once   albuterol, ondansetron **OR** ondansetron (ZOFRAN) IV, oxyCODONE-acetaminophen  Assessment/ Plan:  [name] is a 66 y.o.  female past medical conditions including diabetes, hypertension, lymphedema, breast cancer COPD and chronic kidney disease.  Patient presented for scheduled surgery of robotic assisted ileostomy takedown.  Acute Kidney Injury on chronic kidney disease stage IV with baseline creatinine 2.03 and GFR of 26 on 09/16/21.  Acute  kidney injury secondary to hypotension from procedure Renal ultrasound ordered to evaluate obstruction No IV contrast exposure. No acute need for dialysis at this time.   Creatinine starting to improve, continue IVF  Avoid nephrotoxic agents and further hypotension, if possible. Will need follow up with our office at discharge.   Lab Results  Component Value Date   CREATININE 3.00 (H) 10/26/2021   CREATININE 3.25 (H) 10/25/2021   CREATININE 2.58 (H) 10/24/2021    Intake/Output Summary (Last 24 hours) at 10/26/2021 1022 Last data filed at 10/26/2021 0956 Gross per 24 hour  Intake 480 ml  Output 500 ml  Net -20 ml    2. Hypertension with chronic kidney disease. Currently hypotensive. Receiving Metoprolol only. BP 123/77  3. Ileostomy reversal, completed on 10/23/21. Continue pain management.    LOS: 3   1/12/202310:22 AM

## 2021-10-27 ENCOUNTER — Encounter: Payer: Self-pay | Admitting: Internal Medicine

## 2021-10-27 ENCOUNTER — Inpatient Hospital Stay
Admission: RE | Admit: 2021-10-27 | Discharge: 2021-10-27 | Disposition: A | Discharge status: Home / Self Care | Payer: Medicare Other | Source: Home / Self Care | Attending: Family Medicine | Admitting: Family Medicine

## 2021-10-27 ENCOUNTER — Inpatient Hospital Stay: Payer: Medicare Other

## 2021-10-27 LAB — ECHOCARDIOGRAM COMPLETE
AR max vel: 1.04 cm2
AV Area VTI: 1.21 cm2
AV Area mean vel: 0.91 cm2
AV Mean grad: 9.4 mmHg
AV Peak grad: 16.8 mmHg
Ao pk vel: 2.05 m/s
Area-P 1/2: 4.86 cm2
Height: 66 in
MV VTI: 3.01 cm2
S' Lateral: 3 cm
Weight: 1984 oz

## 2021-10-27 LAB — RENAL FUNCTION PANEL
Albumin: 3 g/dL — ABNORMAL LOW (ref 3.5–5.0)
Anion gap: 10 (ref 5–15)
BUN: 29 mg/dL — ABNORMAL HIGH (ref 8–23)
CO2: 21 mmol/L — ABNORMAL LOW (ref 22–32)
Calcium: 8.4 mg/dL — ABNORMAL LOW (ref 8.9–10.3)
Chloride: 106 mmol/L (ref 98–111)
Creatinine, Ser: 2.11 mg/dL — ABNORMAL HIGH (ref 0.44–1.00)
GFR, Estimated: 25 mL/min — ABNORMAL LOW (ref >60)
Glucose, Bld: 103 mg/dL — ABNORMAL HIGH (ref 70–99)
Phosphorus: 3.2 mg/dL (ref 2.5–4.6)
Potassium: 4 mmol/L (ref 3.5–5.1)
Sodium: 137 mmol/L (ref 135–145)

## 2021-10-27 LAB — HEMOGLOBIN AND HEMATOCRIT, BLOOD
HCT: 21.5 % — ABNORMAL LOW (ref 36.0–46.0)
Hemoglobin: 6.8 g/dL — ABNORMAL LOW (ref 12.0–15.0)

## 2021-10-27 LAB — PREPARE RBC (CROSSMATCH)

## 2021-10-27 MED ORDER — SODIUM CHLORIDE 0.9% IV SOLUTION: Freq: Once | INTRAVENOUS | Status: DC

## 2021-10-27 NOTE — Progress Notes (Signed)
Pt more alert, awake and talkative this evening. States she feels "better" than earlier today. Still not hungry but wanting to take ice chips.

## 2021-10-27 NOTE — Progress Notes (Signed)
Patient ID: Danielle [name], female   DOB: 03-03-1956, 66 y.o.   MRN: [id number]  SURGERY FOLLOW UP NOTE  Patient reevaluated.  She continue with adequate blood pressure and heart rate.  She continued to be more alert today.  Still complaining of soreness in her abdomen.  As per nurse she had a small vomit this morning.  Due to the abdominal x-ray yesterday recommended to have NGT placed for treatment of ileus but patient refused.  Vitals:   10/27/21 1222 10/27/21 1436  BP: (!) 144/84 (!) 155/90  Pulse: 95 94  Resp: 18 18  Temp: 97.8 F (36.6 C) 97.9 F (36.6 C)  SpO2: 94% 93%    Due to abdominal soreness and distention and the fact that patient was a cigarette yesterday and had a vomit today I ordered a CT scan with rectal contrast for evaluation of possible anastomotic leak.  CT scan with rectal contrast shows no free air with patent anastomosis.  There is small bowel and large bowel dilation consistent with ileus.  Patient not nauseous at the moment.  We will hold NGT since patient is refusing NGT at this moment.  After complete work-up from the cardiac, respiratory, infectious and intra-abdominal evaluation I think that would cause her clinical deterioration in the last 2 days has been the metabolic acidosis from the acute kidney injury.  Her renal function is improving with IV fluid hydration and bicarbonate.  I think that her drop in hemoglobin is 9 bleeding.  There has been no sources of GI bleeding or intra-abdominal bleeding.  With the abundant amount of IV fluids and the kidney not working adequately her anemia is more due to her chronic kidney disease.  Patient had a 1 unit of PRBC transfused today due to her cardiac history.  We will continue to follow with daily hemoglobin levels.  Appreciate hospitalist evaluation, management and recommendation regarding her multiple medical comorbidities.  Herbert Pun, MD, FACS

## 2021-10-27 NOTE — Progress Notes (Signed)
Mobility Specialist - Progress Note   10/27/21 1649  Mobility  Activity Refused mobility  Mobility performed by Mobility specialist    Pt declined mobility, alert but fatigued from CT scan earlier. Pt requests to return next available date for activity. Will attempt another time.    Kathee Delton Mobility Specialist 10/27/21, 4:52 PM

## 2021-10-27 NOTE — Progress Notes (Signed)
Progress Note:    JEFFRIE STANDER    ZWC:[id number] DOB: [birth year] DOA: 10/23/2021  PCP: [facility]    Brief Narrative:   Danielle [name] is an 66 y.o. female with a past medical history of anemia, anxiety, aortic atherosclerosis, aortic valve stenosis, chronic atrial fibrillation, stage IV CKD, breast cancer with history of chemotherapy, history of radiotherapy, COPD, depression, diastolic dysfunction, GERD, hyperlipidemia, hypertension, lymphedema, small bowel obstruction, vitamin D deficiency who presented to the hospital for an colostomy reversal    Subjective:   Acute events overnight.  Did vomit this morning.  Is still passing gas.  Had a bowel movement this morning.  Still feels lethargic.  Abdomen more tender today.  Hemodynamics have improved with midodrine.    Assessment and Plan:   Status post ileostomy reversal: Management per general surgery  Ileus: X-ray showed ileus yesterday.  General surgery has ordered an NG tube to low intermittent suction.  The patient will be made NPO.  They have also ordered a CT scan of the abdomen to rule out anastomosis leak.  Acute on chronic anemia: Hemoglobin dropped to 6.8 this morning.  1 unit of packed red blood cells has been ordered.  Unclear etiology.  If CT abdomen pelvis is negative then we will plan on obtaining a Hemoccult to rule out GI bleed.  AKI on CKD 4: Baseline creatinine appears to be around 1.7-2.0.  Continue MIVF with sodium bicarb drip.  Creatinine now down to 2.1. Nephrology consulted and following.  Likely secondary to postoperative hypotension and dehydration.  Paroxysmal A. fib: Home Coreg discontinued.  Was initially switched from Coreg to Lopressor.  Lopressor now discontinued as well due to hypotension.  Blood pressures improved.  Continue home apixaban.  Depression: Continue home Lexapro  Hypovolemic hyponatremia: Continue bicarb drip  Metabolic acidosis: Continue bicarb  drip  Elevated high-sensitivity troponins: Likely demand from hypotension.  No chest pain.  EKG showed no acute ischemic changes yesterday.  Echocardiogram shows no regional wall motion abnormalities.  Troponins have flattened.  Hypotension: PCCM consulted and they placed the patient on midodrine.  Hypertension: Holding home amlodipine and torsemide due to hypotension and acute kidney injury     Other information:    DVT prophylaxis: Apixaban Code Status: Full code    Consultants:   Nephrology, general surgery    Objective:    Vitals:   10/27/21 1157 10/27/21 1200 10/27/21 1222 10/27/21 1436  BP: 138/80 138/80 (!) 144/84 (!) 155/90  Pulse: 94 94 95 94  Resp: 20 20 18 18   Temp: 99.1 F (37.3 C) 99.1 F (37.3 C) 97.8 F (36.6 C) 97.9 F (36.6 C)  TempSrc: Oral Oral    SpO2: 97% 97% 94% 93%  Weight:      Height:        Intake/Output Summary (Last 24 hours) at 10/27/2021 1618 Last data filed at 10/27/2021 1445 Gross per 24 hour  Intake 1006.5 ml  Output 200 ml  Net 806.5 ml    Filed Weights   10/23/21 0609  Weight: 56.2 kg       Physical Exam:    General exam: Appears calm and comfortable but lethargic Respiratory system: Clear to auscultation. Respiratory effort normal. Cardiovascular system: S1 & S2 heard, RRR. No JVD, murmurs, rubs, gallops or clicks. No pedal edema. Gastrointestinal system: Abdomen is mildly to moderately distended with more tenderness to palpation that is generalized.  Prior to this her abdomen was sore. Central nervous system:  Alert and oriented. No focal neurological deficits. Extremities: Symmetric 5 x 5 power. Skin: No rashes, lesions or ulcers Psychiatry: Judgement and insight appear normal. Mood & affect appropriate.     Data Reviewed:    I have personally reviewed following labs and imaging studies  CBC: Recent Labs  Lab 10/24/21 0431 10/25/21 0436 10/26/21 0429 10/26/21 1657 10/27/21 0442  WBC 19.3* 10.2  --   8.7  --   NEUTROABS  --   --   --  6.8  --   HGB 8.0* 7.3* 7.2* 8.0* 6.8*  HCT 24.7* 24.1* 22.2* 25.7* 21.5*  MCV 93.2 99.2  --  97.7  --   PLT 416* 390  --  411*  --      Basic Metabolic Panel: Recent Labs  Lab 10/24/21 0431 10/25/21 0436 10/26/21 0429 10/26/21 1657 10/27/21 0442  NA 129* 131* 131* 134* 137  K 4.8 4.6 4.6 5.0 4.0  CL 108 110 110 109 106  CO2 15* 14* 16* 18* 21*  GLUCOSE 155* 108* 92 109* 103*  BUN 32* 32* 33* 35* 29*  CREATININE 2.58* 3.25* 3.00* 2.79* 2.11*  CALCIUM 8.1* 7.7* 7.8* 8.3* 8.4*  MG 1.9  --   --   --   --   PHOS 4.7*  --  3.2  --  3.2     GFR: Estimated Creatinine Clearance: 23 mL/min (A) (by C-G formula based on SCr of 2.11 mg/dL (H)).  Liver Function Tests: Recent Labs  Lab 10/25/21 0436 10/26/21 0429 10/27/21 0442  AST 19  --   --   ALT 9  --   --   ALKPHOS 47  --   --   BILITOT 0.2*  --   --   PROT 6.5  --   --   ALBUMIN 2.6* 2.5* 3.0*     CBG: Recent Labs  Lab 10/23/21 1243 10/26/21 0708 10/26/21 1626  GLUCAP 143* 89 104*      Recent Results (from the past 240 hour(s))  SARS CORONAVIRUS 2 (TAT 6-24 HRS) Nasopharyngeal Nasopharyngeal Swab     Status: None   Collection Time: 10/19/21  9:56 AM   Specimen: Nasopharyngeal Swab  Result Value Ref Range Status   SARS Coronavirus 2 NEGATIVE NEGATIVE Final    Comment: (NOTE) SARS-CoV-2 target nucleic acids are NOT DETECTED.  The SARS-CoV-2 RNA is generally detectable in upper and lower respiratory specimens during the acute phase of infection. Negative results do not preclude SARS-CoV-2 infection, do not rule out co-infections with other pathogens, and should not be used as the sole basis for treatment or other patient management decisions. Negative results must be combined with clinical observations, patient history, and epidemiological information. The expected result is Negative.  Fact Sheet for Patients: SugarRoll.be  Fact Sheet  for Healthcare Providers: https://www.woods-mathews.com/  This test is not yet approved or cleared by the [place] FDA and  has been authorized for detection and/or diagnosis of SARS-CoV-2 by FDA under an Emergency Use Authorization (EUA). This EUA will remain  in effect (meaning this test can be used) for the duration of the COVID-19 declaration under Se ction 564(b)(1) of the Act, 21 U.S.C. section 360bbb-3(b)(1), unless the authorization is terminated or revoked sooner.  Performed at [facility], [place] [address] [place], [place] [address]           Radiology Studies:    DG Chest 1 View  Result Date: 10/26/2021 CLINICAL DATA:  Hypotension EXAM: CHEST  1  VIEW COMPARISON:  11/16/2020, 06/05/2020, PET CT 12/01/2019, CT chest 11/06/2019, 11/06/2019 FINDINGS: Mild cardiomegaly with central vascular congestion. Small pleural effusions and diffuse increased interstitial opacity likely due to edema. Aortic atherosclerosis. No pneumothorax. Irregular opacity with mild distortion and nodularity in the right upper lobe and right hilus similar compared to intermittent previous exams and presumably due to scarring. IMPRESSION: 1. Cardiomegaly with vascular congestion, small pleural effusions and mild diffuse interstitial edema 2. Irregular and nodular opacities in the right hilus and upper lobe present on previous exams and probably due to scarring Electronically Signed   By: Donavan Foil M.D.   On: 10/26/2021 17:54   DG Abd 1 View  Result Date: 10/26/2021 CLINICAL DATA:  Hypotension history of ileostomy take down EXAM: ABDOMEN - 1 VIEW COMPARISON:  11/05/2020 FINDINGS: Clips in the pelvis. Moderate air distension of the stomach. Air-filled dilated small bowel up to 3.5 cm in the central abdomen. Extensive mottled appearance of the abdominal soft tissues presumably due to soft tissue emphysema related to recent laparoscopic procedure. IMPRESSION: 1. Air distension of  stomach and small bowel either due to ileus or possible obstruction. 2. Diffuse mottled appearance of soft tissues felt consistent with soft tissue emphysema likely due to recent laparoscopic procedure Electronically Signed   By: Donavan Foil M.D.   On: 10/26/2021 17:51   US RENAL  Result Date: 10/25/2021 CLINICAL DATA:  Acute kidney injury EXAM: RENAL / URINARY TRACT ULTRASOUND COMPLETE COMPARISON:  CT 11/05/2020 FINDINGS: Right Kidney: Renal measurements: 8.9 x 4.1 x 4.2 cm = volume: 79.8 mL. Cortex slightly echogenic. No mass or hydronephrosis Left Kidney: Renal measurements: 7.5 x 4.4 x 4 cm = volume: 88.5 mL. Cortex slightly echogenic. No mass or hydronephrosis. Bladder: Appears normal for degree of bladder distention. Other: None. IMPRESSION: Slight increased cortical echogenicity suggesting medical renal disease. No hydronephrosis. Electronically Signed   By: Donavan Foil M.D.   On: 10/25/2021 16:28   ECHOCARDIOGRAM COMPLETE  Result Date: 10/27/2021    ECHOCARDIOGRAM REPORT   Patient Name:   MATHEW STORCK [facility] Date of Exam: 10/27/2021 Medical Rec #:  [id number]       Height:       66.0 in Accession #:    [id number]      Weight:       124.0 lb Date of Birth:  04/08/1956       BSA:          76.632 m Patient Age:    38 years        BP:           144/84 mmHg Patient Gender: F               HR:           95 bpm. Exam Location:  [facility] Procedure: 2D Echo, Cardiac Doppler and Color Doppler Indications:     Elevated troponin  History:         Patient has prior history of Echocardiogram examinations, most                  recent 11/07/2019. COPD, Arrythmias:Atrial Fibrillation,                  Signs/Symptoms:Murmur; Risk Factors:Hypertension.  Sonographer:     Sherrie Sport Referring Phys:  [place] Diagnosing Phys: Donnelly Angelica IMPRESSIONS  1. Left ventricular ejection fraction, by estimation, is 60 to 65%. The left ventricle has normal function. The left ventricle has no  regional wall motion  abnormalities. Left ventricular diastolic parameters are consistent with Grade I diastolic dysfunction (impaired relaxation).  2. Right ventricular systolic function is normal. The right ventricular size is mildly enlarged.  3. Right atrial size was mildly dilated.  4. The mitral valve is normal in structure. Moderate mitral valve regurgitation. No evidence of mitral stenosis.  5. Tricuspid valve regurgitation is moderate.  6. The aortic valve is calcified. Aortic valve regurgitation is not visualized. Mild aortic valve stenosis.  7. The inferior vena cava is normal in size with greater than 50% respiratory variability, suggesting right atrial pressure of 3 mmHg. Conclusion(s)/Recommendation(s): CALCIFIED AORTIC VALVE WITH MILD AS. VMax 2.1 m/s. Dimensionless index 0.4. FINDINGS  Left Ventricle: Left ventricular ejection fraction, by estimation, is 60 to 65%. The left ventricle has normal function. The left ventricle has no regional wall motion abnormalities. The left ventricular internal cavity size was normal in size. There is  no left ventricular hypertrophy. Left ventricular diastolic parameters are consistent with Grade I diastolic dysfunction (impaired relaxation). Right Ventricle: The right ventricular size is mildly enlarged. No increase in right ventricular wall thickness. Right ventricular systolic function is normal. Left Atrium: Left atrial size was normal in size. Right Atrium: Right atrial size was mildly dilated. Pericardium: There is no evidence of pericardial effusion. Mitral Valve: The mitral valve is normal in structure. Moderate mitral valve regurgitation. No evidence of mitral valve stenosis. MV peak gradient, 3.1 mmHg. The mean mitral valve gradient is 1.0 mmHg. Tricuspid Valve: The tricuspid valve is normal in structure. Tricuspid valve regurgitation is moderate . No evidence of tricuspid stenosis. Aortic Valve: The aortic valve is calcified. Aortic valve regurgitation is not visualized. Mild  aortic stenosis is present. Aortic valve mean gradient measures 9.4 mmHg. Aortic valve peak gradient measures 16.8 mmHg. Aortic valve area, by VTI measures 1.21 cm. Pulmonic Valve: The pulmonic valve was not well visualized. Pulmonic valve regurgitation is not visualized. No evidence of pulmonic stenosis. Aorta: The aortic root is normal in size and structure. Venous: The inferior vena cava is normal in size with greater than 50% respiratory variability, suggesting right atrial pressure of 3 mmHg. IAS/Shunts: No atrial level shunt detected by color flow Doppler.  LEFT VENTRICLE PLAX 2D LVIDd:         4.80 cm   Diastology LVIDs:         3.00 cm   LV e' medial:    5.87 cm/s LV PW:         1.10 cm   LV E/e' medial:  13.9 LV IVS:        0.75 cm   LV e' lateral:   12.40 cm/s LVOT diam:     2.00 cm   LV E/e' lateral: 6.6 LV SV:         45 LV SV Index:   28 LVOT Area:     3.14 cm  RIGHT VENTRICLE RV Basal diam:  3.80 cm RV S prime:     12.20 cm/s TAPSE (M-mode): 1.8 cm LEFT ATRIUM             Index        RIGHT ATRIUM           Index LA diam:        3.20 cm 1.96 cm/m   RA Area:     19.30 cm LA Vol (A2C):   58.8 ml 36.03 ml/m  RA Volume:   63.90 ml  39.15 ml/m LA Vol (A4C):  38.7 ml 23.71 ml/m LA Biplane Vol: 49.1 ml 30.08 ml/m  AORTIC VALVE                     PULMONIC VALVE AV Area (Vmax):    1.04 cm      PV Vmax:        0.59 m/s AV Area (Vmean):   0.91 cm      PV Vmean:       35.200 cm/s AV Area (VTI):     1.21 cm      PV VTI:         0.109 m AV Vmax:           205.20 cm/s   PV Peak grad:   1.4 mmHg AV Vmean:          140.200 cm/s  PV Mean grad:   1.0 mmHg AV VTI:            0.373 m       RVOT Peak grad: 4 mmHg AV Peak Grad:      16.8 mmHg AV Mean Grad:      9.4 mmHg LVOT Vmax:         67.90 cm/s LVOT Vmean:        40.500 cm/s LVOT VTI:          0.144 m LVOT/AV VTI ratio: 0.39  AORTA Ao Root diam: 3.17 cm MITRAL VALVE               TRICUSPID VALVE MV Area (PHT): 4.86 cm    TR Peak grad:   52.4 mmHg MV Area  VTI:   3.01 cm    TR Vmax:        362.00 cm/s MV Peak grad:  3.1 mmHg MV Mean grad:  1.0 mmHg    SHUNTS MV Vmax:       0.88 m/s    Systemic VTI:  0.14 m MV Vmean:      55.0 cm/s   Systemic Diam: 2.00 cm MV Decel Time: 156 msec    Pulmonic VTI:  0.157 m MV E velocity: 81.40 cm/s MV A velocity: 51.40 cm/s MV E/A ratio:  1.58 Donnelly Angelica Electronically signed by Donnelly Angelica Signature Date/Time: 10/27/2021/4:07:10 PM    Final         Medications:    Scheduled Meds:  citalopram  40 mg Oral BH-q7a   feeding supplement  237 mL Oral TID BM   fluticasone furoate-vilanterol  1 puff Inhalation Daily   And   umeclidinium bromide  1 puff Inhalation Daily   gabapentin  300 mg Oral BID   midodrine  10 mg Oral TID WC   multivitamin with minerals  1 tablet Oral Daily   pantoprazole (PROTONIX) IV  40 mg Intravenous QHS   Continuous Infusions:   sodium bicarbonate (isotonic) infusion in sterile water 75 mL/hr at 10/27/21 1141       LOS: 4 days    Time spent: 35 minutes    Leslee Home, MD [facility]   To contact the attending provider between 7A-7P or the covering provider during after hours 7P-7A, please log into the web site [email or web address] and access using universal [place] password for that web site. If you do not have the password, please call the hospital operator.  10/27/2021, 4:18 PM

## 2021-10-27 NOTE — Progress Notes (Signed)
Pt's blood pressures have not been low today. Dr. Peyton Najjar ok that Midodrine has not been given today.

## 2021-10-27 NOTE — Progress Notes (Addendum)
This morning, pt had small vomitus with undigested pills in vomit leftover from night shift. Explained to patient that doctor is recommending NG tube to clear out her stomach. Pt states, "No way am I going to have you do that. I want to wait and see what happens." Doctor notified.   Pt had medium liquid stool early this afternoon and afterward stated that her stomach "felt much better".

## 2021-10-27 NOTE — Progress Notes (Signed)
PT Cancellation Note  Patient Details Name: Danielle [name] MRN: [id number] DOB: 03/15/1956   Cancelled Treatment:    Reason Eval/Treat Not Completed: Fatigue/lethargy limiting ability to participate;Medical issues which prohibited therapy;Patient at procedure or test/unavailable. Chart reviewed. Pt with Hgb 6.8. Spoke to RN who states she received transfusion earlier this date. She continues to to feel lethargic, experienced nausea and vomiting this morning and reports feeling "puny". Pt now off the floor for CT. Will attempt treatment at later date as pt is medically appropriate.     Patrina Levering PT, DPT 10/27/21 3:14 PM [phone]

## 2021-10-27 NOTE — Progress Notes (Signed)
Patient ID: Danielle [name], female   DOB: January 04, 1956, 66 y.o.   MRN: [id number]     [facility] [facility] Day(s): 4.   Interval History: Patient seen and examined, no acute events or new complaints overnight.   No new issues overnight.  Her blood pressure this morning were better.  Continue adequate heart rate.  No fever.  She just had a moment right now.  Abdomen looks distended.  X-ray yesterday shows ileus.  Vital signs in last 24 hours: [min-max] current  Temp:  [97.7 F (36.5 C)-99.6 F (37.6 C)] 98.4 F (36.9 C) (01/13 0738) Pulse Rate:  [25-101] 96 (01/13 0738) Resp:  [16-22] 16 (01/13 0738) BP: (86-155)/(64-97) 143/97 (01/13 0738) SpO2:  [88 %-100 %] 97 % (01/13 0738)     Height: 5\' 6"  (167.6 cm) Weight: 56.2 kg BMI (Calculated): 20.02   Physical Exam:  Constitutional: alert, cooperative and no distress  Respiratory: breathing non-labored at rest  Cardiovascular: regular rate and sinus rhythm  Gastrointestinal: soft, tender, and distended  Labs:  CBC Latest Ref Rng & Units 10/27/2021 10/26/2021 10/26/2021  WBC 4.0 - 10.5 K/uL - 8.7 -  Hemoglobin 12.0 - 15.0 g/dL 6.8(L) 8.0(L) 7.2(L)  Hematocrit 36.0 - 46.0 % 21.5(L) 25.7(L) 22.2(L)  Platelets 150 - 400 K/uL - 411(H) -   CMP Latest Ref Rng & Units 10/27/2021 10/26/2021 10/26/2021  Glucose 70 - 99 mg/dL 103(H) 109(H) 92  BUN 8 - 23 mg/dL 29(H) 35(H) 33(H)  Creatinine 0.44 - 1.00 mg/dL 2.11(H) 2.79(H) 3.00(H)  Sodium 135 - 145 mmol/L 137 134(L) 131(L)  Potassium 3.5 - 5.1 mmol/L 4.0 5.0 4.6  Chloride 98 - 111 mmol/L 106 109 110  CO2 22 - 32 mmol/L 21(L) 18(L) 16(L)  Calcium 8.9 - 10.3 mg/dL 8.4(L) 8.3(L) 7.8(L)  Total Protein 6.5 - 8.1 g/dL - - -  Total Bilirubin 0.3 - 1.2 mg/dL - - -  Alkaline Phos 38 - 126 U/L - - -  AST 15 - 41 U/L - - -  ALT 0 - 44 U/L - - -    Imaging studies: Abdominal x-ray yesterday shows distended stomach with mild dilation of small bowel.  It was consistent with ileus.  No free air  on chest x-ray or abdominal x-ray.   Assessment/Plan:  66 y.o. female with history of an ileostomy and mucous fistula 4 Day Post-Op s/p ileostomy reversal, complicated by pertinent comorbidities including COPD, atrial fibrillation on anticoagulation, hypertension, chronic kidney disease stage III.  Status post ileostomy reversal -Today the patient had episode of vomiting.  With x-ray yesterday showing ileus I will put NGT to avoid further vomiting and aspiration. -I will order CT scan of the abdomen to rule out anastomosis leak. -Hemoglobin this morning 6.8.  I will need 1 unit of PRBC since it is less than 7 and patient with cardiac history.   Chronic kidney disease stage III -Acute kidney injury after surgery.  BUN and creatinine continuing decreasing trend -Metabolic acidosis history treated with bicarbonate.  Improved acidosis today. -Appreciate hospitalist management of medical comorbidities -Repeat labs in a.m.   Arnold Long, MD

## 2021-10-27 NOTE — Progress Notes (Signed)
[facility]  ROUNDING NOTE   Subjective:   Danielle [name] 66 year old female with past medical conditions including diabetes, hypertension, lymphedema, breast cancer COPD and chronic kidney disease.  Patient presented for scheduled surgery of robotic assisted ileostomy takedown.  Patient is known to our clinic and is followed outpatient by Dr. Holley Raring.    Patient resting quietly States she is tired this morning Complains of abdominal pain, managed well with prescribed medications Denies shortness of breath   Objective:  Vital signs in last 24 hours:  Temp:  [97.7 F (36.5 C)-99.6 F (37.6 C)] 98.4 F (36.9 C) (01/13 0738) Pulse Rate:  [25-99] 96 (01/13 0738) Resp:  [16-22] 16 (01/13 0738) BP: (86-155)/(64-97) 143/97 (01/13 0738) SpO2:  [88 %-100 %] 97 % (01/13 0738)  Weight change:  Filed Weights   10/23/21 0609  Weight: 56.2 kg    Intake/Output: I/O last 3 completed shifts: In: 1042.5 [P.O.:360; I.V.:682.5] Out: 700 [Urine:700]   Intake/Output this shift:  No intake/output data recorded.  Physical Exam: General: NAD, resting quietly  Head: Normocephalic, atraumatic. Moist oral mucosal membranes  Eyes: Anicteric  Lungs:  Clear to auscultation, normal effort  Heart: Regular rate and rhythm  Abdomen:  Soft, nontender  Extremities: No peripheral edema.  Neurologic: Nonfocal, moving all four extremities  Skin: No lesions       Basic Metabolic Panel: Recent Labs  Lab 10/24/21 0431 10/25/21 0436 10/26/21 0429 10/26/21 1657 10/27/21 0442  NA 129* 131* 131* 134* 137  K 4.8 4.6 4.6 5.0 4.0  CL 108 110 110 109 106  CO2 15* 14* 16* 18* 21*  GLUCOSE 155* 108* 92 109* 103*  BUN 32* 32* 33* 35* 29*  CREATININE 2.58* 3.25* 3.00* 2.79* 2.11*  CALCIUM 8.1* 7.7* 7.8* 8.3* 8.4*  MG 1.9  --   --   --   --   PHOS 4.7*  --  3.2  --  3.2     Liver Function Tests: Recent Labs  Lab 10/25/21 0436 10/26/21 0429 10/27/21 0442  AST 19  --   --   ALT 9   --   --   ALKPHOS 47  --   --   BILITOT 0.2*  --   --   PROT 6.5  --   --   ALBUMIN 2.6* 2.5* 3.0*    No results for input(s): LIPASE, AMYLASE in the last 168 hours. No results for input(s): AMMONIA in the last 168 hours.  CBC: Recent Labs  Lab 10/24/21 0431 10/25/21 0436 10/26/21 0429 10/26/21 1657 10/27/21 0442  WBC 19.3* 10.2  --  8.7  --   NEUTROABS  --   --   --  6.8  --   HGB 8.0* 7.3* 7.2* 8.0* 6.8*  HCT 24.7* 24.1* 22.2* 25.7* 21.5*  MCV 93.2 99.2  --  97.7  --   PLT 416* 390  --  411*  --      Cardiac Enzymes: No results for input(s): CKTOTAL, CKMB, CKMBINDEX, TROPONINI in the last 168 hours.  BNP: Invalid input(s): POCBNP  CBG: Recent Labs  Lab 10/23/21 1243 10/26/21 0708 10/26/21 1626  GLUCAP 143* 89 104*     Microbiology: Results for orders placed or performed during the hospital encounter of 10/19/21  SARS CORONAVIRUS 2 (TAT 6-24 HRS) Nasopharyngeal Nasopharyngeal Swab     Status: None   Collection Time: 10/19/21  9:56 AM   Specimen: Nasopharyngeal Swab  Result Value Ref Range Status   SARS Coronavirus 2  NEGATIVE NEGATIVE Final    Comment: (NOTE) SARS-CoV-2 target nucleic acids are NOT DETECTED.  The SARS-CoV-2 RNA is generally detectable in upper and lower respiratory specimens during the acute phase of infection. Negative results do not preclude SARS-CoV-2 infection, do not rule out co-infections with other pathogens, and should not be used as the sole basis for treatment or other patient management decisions. Negative results must be combined with clinical observations, patient history, and epidemiological information. The expected result is Negative.  Fact Sheet for Patients: SugarRoll.be  Fact Sheet for Healthcare Providers: https://www.woods-mathews.com/  This test is not yet approved or cleared by the [place] FDA and  has been authorized for detection and/or diagnosis of SARS-CoV-2  by FDA under an Emergency Use Authorization (EUA). This EUA will remain  in effect (meaning this test can be used) for the duration of the COVID-19 declaration under Se ction 564(b)(1) of the Act, 21 U.S.C. section 360bbb-3(b)(1), unless the authorization is terminated or revoked sooner.  Performed at [facility], [address], [place], [place] [address]     Coagulation Studies: No results for input(s): LABPROT, INR in the last 72 hours.  Urinalysis: No results for input(s): COLORURINE, LABSPEC, PHURINE, GLUCOSEU, HGBUR, BILIRUBINUR, KETONESUR, PROTEINUR, UROBILINOGEN, NITRITE, LEUKOCYTESUR in the last 72 hours.  Invalid input(s): APPERANCEUR    Imaging: DG Chest 1 View  Result Date: 10/26/2021 CLINICAL DATA:  Hypotension EXAM: CHEST  1 VIEW COMPARISON:  11/16/2020, 06/05/2020, PET CT 12/01/2019, CT chest 11/06/2019, 11/06/2019 FINDINGS: Mild cardiomegaly with central vascular congestion. Small pleural effusions and diffuse increased interstitial opacity likely due to edema. Aortic atherosclerosis. No pneumothorax. Irregular opacity with mild distortion and nodularity in the right upper lobe and right hilus similar compared to intermittent previous exams and presumably due to scarring. IMPRESSION: 1. Cardiomegaly with vascular congestion, small pleural effusions and mild diffuse interstitial edema 2. Irregular and nodular opacities in the right hilus and upper lobe present on previous exams and probably due to scarring Electronically Signed   By: Donavan Foil M.D.   On: 10/26/2021 17:54   DG Abd 1 View  Result Date: 10/26/2021 CLINICAL DATA:  Hypotension history of ileostomy take down EXAM: ABDOMEN - 1 VIEW COMPARISON:  11/05/2020 FINDINGS: Clips in the pelvis. Moderate air distension of the stomach. Air-filled dilated small bowel up to 3.5 cm in the central abdomen. Extensive mottled appearance of the abdominal soft tissues presumably due to soft tissue emphysema related to  recent laparoscopic procedure. IMPRESSION: 1. Air distension of stomach and small bowel either due to ileus or possible obstruction. 2. Diffuse mottled appearance of soft tissues felt consistent with soft tissue emphysema likely due to recent laparoscopic procedure Electronically Signed   By: Donavan Foil M.D.   On: 10/26/2021 17:51   US RENAL  Result Date: 10/25/2021 CLINICAL DATA:  Acute kidney injury EXAM: RENAL / URINARY TRACT ULTRASOUND COMPLETE COMPARISON:  CT 11/05/2020 FINDINGS: Right Kidney: Renal measurements: 8.9 x 4.1 x 4.2 cm = volume: 79.8 mL. Cortex slightly echogenic. No mass or hydronephrosis Left Kidney: Renal measurements: 7.5 x 4.4 x 4 cm = volume: 88.5 mL. Cortex slightly echogenic. No mass or hydronephrosis. Bladder: Appears normal for degree of bladder distention. Other: None. IMPRESSION: Slight increased cortical echogenicity suggesting medical renal disease. No hydronephrosis. Electronically Signed   By: Donavan Foil M.D.   On: 10/25/2021 16:28     Medications:     sodium bicarbonate (isotonic) infusion in sterile water 75 mL/hr at 10/27/21 [id number]  sodium chloride   Intravenous Once   apixaban  2.5 mg Oral BID   citalopram  40 mg Oral [id number]   feeding supplement  237 mL Oral TID BM   fluticasone furoate-vilanterol  1 puff Inhalation Daily   And   umeclidinium bromide  1 puff Inhalation Daily   gabapentin  300 mg Oral BID   midodrine  10 mg Oral TID WC   multivitamin with minerals  1 tablet Oral Daily   pantoprazole (PROTONIX) IV  40 mg Intravenous QHS   albuterol, ondansetron **OR** ondansetron (ZOFRAN) IV, oxyCODONE-acetaminophen  Assessment/ Plan:  Danielle [name] is a 66 y.o.  female past medical conditions including diabetes, hypertension, lymphedema, breast cancer COPD and chronic kidney disease.  Patient presented for scheduled surgery of robotic assisted ileostomy takedown.  Right colectomy with ileostomy and mucus fistula in July 2019. Too  deconditioned at that time for scheduled reversal. Improved strength and oral intake with adequate output from ileostomy.   Acute Kidney Injury on chronic kidney disease stage IV with baseline creatinine 2.03 and GFR of 26 on 09/16/21.  Acute kidney injury secondary to hypotension from procedure Renal ultrasound ordered to evaluate obstruction No IV contrast exposure. No acute need for dialysis at this time.   Creatinine slowly returning to baseline. If appetite appropriate, can d/c IVF. Continue to avoid hypotension and nephrotoxic agents if possible.   Lab Results  Component Value Date   CREATININE 2.11 (H) 10/27/2021   CREATININE 2.79 (H) 10/26/2021   CREATININE 3.00 (H) 10/26/2021    Intake/Output Summary (Last 24 hours) at 10/27/2021 1029 Last data filed at 10/27/2021 0601 Gross per 24 hour  Intake 1042.5 ml  Output 200 ml  Net 842.5 ml    2. Hypertension with chronic kidney disease. Currently hypotensive. Receiving Metoprolol only. BP 143/97, within acceptable range for this patient  3. Ileostomy reversal, completed on 10/23/21. Surgery following. Pain management   LOS: 4   1/13/202310:29 AM

## 2021-10-27 NOTE — Progress Notes (Signed)
*  PRELIMINARY RESULTS* Echocardiogram 2D Echocardiogram has been performed.  Sherrie Sport 10/27/2021, 1:18 PM

## 2021-10-28 ENCOUNTER — Inpatient Hospital Stay: Payer: Medicare Other

## 2021-10-28 LAB — CBC
HCT: 25.4 % — ABNORMAL LOW (ref 36.0–46.0)
Hemoglobin: 8.5 g/dL — ABNORMAL LOW (ref 12.0–15.0)
MCH: 31.1 pg (ref 26.0–34.0)
MCHC: 33.5 g/dL (ref 30.0–36.0)
MCV: 93 fL (ref 80.0–100.0)
Platelets: 372 10*3/uL (ref 150–400)
RBC: 2.73 MIL/uL — ABNORMAL LOW (ref 3.87–5.11)
RDW: 14.6 % (ref 11.5–15.5)
WBC: 10.1 10*3/uL (ref 4.0–10.5)
nRBC: 2.1 % — ABNORMAL HIGH (ref 0.0–0.2)

## 2021-10-28 LAB — BASIC METABOLIC PANEL
Anion gap: 14 (ref 5–15)
BUN: 21 mg/dL (ref 8–23)
CO2: 26 mmol/L (ref 22–32)
Calcium: 8.3 mg/dL — ABNORMAL LOW (ref 8.9–10.3)
Chloride: 95 mmol/L — ABNORMAL LOW (ref 98–111)
Creatinine, Ser: 1.42 mg/dL — ABNORMAL HIGH (ref 0.44–1.00)
GFR, Estimated: 41 mL/min — ABNORMAL LOW (ref >60)
Glucose, Bld: 94 mg/dL (ref 70–99)
Potassium: 3.5 mmol/L (ref 3.5–5.1)
Sodium: 135 mmol/L (ref 135–145)

## 2021-10-28 LAB — BPAM RBC
Blood Product Expiration Date: 202302092359
ISSUE DATE / TIME: 202301131155
Unit Type and Rh: 5100

## 2021-10-28 LAB — TYPE AND SCREEN
ABO/RH(D): O POS
Antibody Screen: NEGATIVE
Unit division: 0

## 2021-10-28 MED ORDER — METOPROLOL TARTRATE 5 MG/5ML IV SOLN
5.0000 mg | Freq: Four times a day (QID) | INTRAVENOUS | Status: DC | PRN
  Administered 2021-10-28 – 2021-10-31 (×3): 5 mg via INTRAVENOUS
  Filled 2021-10-28 (×3): qty 5

## 2021-10-28 MED ORDER — APIXABAN 2.5 MG PO TABS
2.5000 mg | ORAL_TABLET | Freq: Two times a day (BID) | ORAL | Status: DC
  Administered 2021-10-28 – 2021-11-12 (×32): 2.5 mg via ORAL
  Filled 2021-10-28 (×33): qty 1

## 2021-10-28 MED ORDER — HYDROMORPHONE HCL 1 MG/ML IJ SOLN
0.5000 mg | INTRAMUSCULAR | Status: DC | PRN
  Administered 2021-10-28: 0.5 mg via INTRAVENOUS
  Filled 2021-10-28: qty 0.5

## 2021-10-28 MED ORDER — KCL IN DEXTROSE-NACL 20-5-0.45 MEQ/L-%-% IV SOLN
INTRAVENOUS | Status: AC
  Filled 2021-10-28 (×10): qty 1000

## 2021-10-28 NOTE — Progress Notes (Addendum)
Progress Note:    NAKESHIA [name]    YHC:[id number] DOB: 1956/03/11 DOA: 10/23/2021  PCP: [facility] [place]    Brief Narrative:   Danielle [name] is an 66 y.o. female with a past medical history of anemia, anxiety, aortic atherosclerosis, aortic valve stenosis, chronic atrial fibrillation, stage IV CKD, breast cancer with history of chemotherapy, history of radiotherapy, COPD, depression, diastolic dysfunction, GERD, hyperlipidemia, hypertension, lymphedema, small bowel obstruction, vitamin D deficiency who presented to the hospital for an colostomy reversal    Subjective:   Acute events overnight.  Is passing gas. No recent vomiting. Pain moderately controlled    Assessment and Plan:   Status post ileostomy reversal: Management per general surgery  Ileus: X-ray showed ileus .  General surgery has ordered an NG tube which is now clamped, presumably if tolerates will begin advancing diet. Continue fluids until taking npo  Acute on chronic anemia: Hemoglobin dropped to 6.8 1/13, 1 unit prbcs given, hgb 8.5 today which is appropriate. Ct w/o bleeding. Likely surgical blood loss. Will ctm.   Hypoxia Baseline copd, says uses o2 intermittently at home. Cxr on 1/12 shows cardiomegaly w/ vascular congestion, small pleural effusions, and mild interstitial edema. Is on torsemide at home which has been held 2/2 hypovolemic aki. Will likely resume when tolerating po assuming bp remains stable  AKI on CKD 3b: Nephrology following, aki has resolved with fluids   Esophageal cancer: s/p curative radiation. Followed as outpt by oncology  Paroxysmal A. fib: Home Coreg discontinued 2/2 hypotension. BPs have improved. Stopping midodrine today as below. If bp holds will plan to resume BB tomorrow. Continue apixaban.    Depression: Continue home Lexapro  Hypovolemic hyponatremia: resolved w/ fluids  Metabolic acidosis: resolved w/ fluids  Hypotension: PCCM consulted and they  placed the patient on midodrine. Bp has improved, will discontinue midodrine and monitor.  Hypertension: Holding home amlodipine and torsemide due to hypotension and acute kidney injury     Other information:    DVT prophylaxis: Apixaban Code Status: Full code    Consultants:   Nephrology, general surgery    Objective:    Vitals:   10/27/21 1644 10/27/21 2117 10/28/21 0427 10/28/21 0729  BP: (!) 149/97 (!) 144/89 (!) 141/72 130/80  Pulse: 94 91 96 (!) 106  Resp: 18 16 20 18   Temp: 98 F (36.7 C) 98.7 F (37.1 C) 99.3 F (37.4 C) 98.6 F (37 C)  TempSrc:   Oral   SpO2: 95% 94% 90% 95%  Weight:      Height:        Intake/Output Summary (Last 24 hours) at 10/28/2021 1341 Last data filed at 10/28/2021 0555 Gross per 24 hour  Intake 1896.16 ml  Output 850 ml  Net 1046.16 ml   Filed Weights   10/23/21 0609  Weight: 56.2 kg       Physical Exam:    General exam: Appears calm and comfortable but lethargic Respiratory system: Clear to auscultation. Respiratory effort normal. Cardiovascular system: S1 & S2 heard, RRR. No JVD, murmurs, rubs, gallops or clicks. No pedal edema. Gastrointestinal system: Abdomen is mildly to moderately distended with more tenderness to palpation that is generalized.  Prior to this her abdomen was sore. Central nervous system: Alert and oriented. No focal neurological deficits. Extremities: Symmetric 5 x 5 power. Skin: No rashes, lesions or ulcers Psychiatry: Judgement and insight appear normal. Mood & affect appropriate.     Data Reviewed:    I  have personally reviewed following labs and imaging studies  CBC: Recent Labs  Lab 10/24/21 0431 10/25/21 0436 10/26/21 0429 10/26/21 1657 10/27/21 0442 10/28/21 0614  WBC 19.3* 10.2  --  8.7  --  10.1  NEUTROABS  --   --   --  6.8  --   --   HGB 8.0* 7.3* 7.2* 8.0* 6.8* 8.5*  HCT 24.7* 24.1* 22.2* 25.7* 21.5* 25.4*  MCV 93.2 99.2  --  97.7  --  93.0  PLT 416* 390  --  411*   --  644    Basic Metabolic Panel: Recent Labs  Lab 10/24/21 0431 10/25/21 0436 10/26/21 0429 10/26/21 1657 10/27/21 0442 10/28/21 0614  NA 129* 131* 131* 134* 137 135  K 4.8 4.6 4.6 5.0 4.0 3.5  CL 108 110 110 109 106 95*  CO2 15* 14* 16* 18* 21* 26  GLUCOSE 155* 108* 92 109* 103* 94  BUN 32* 32* 33* 35* 29* 21  CREATININE 2.58* 3.25* 3.00* 2.79* 2.11* 1.42*  CALCIUM 8.1* 7.7* 7.8* 8.3* 8.4* 8.3*  MG 1.9  --   --   --   --   --   PHOS 4.7*  --  3.2  --  3.2  --     GFR: Estimated Creatinine Clearance: 34.1 mL/min (A) (by C-G formula based on SCr of 1.42 mg/dL (H)).  Liver Function Tests: Recent Labs  Lab 10/25/21 0436 10/26/21 0429 10/27/21 0442  AST 19  --   --   ALT 9  --   --   ALKPHOS 47  --   --   BILITOT 0.2*  --   --   PROT 6.5  --   --   ALBUMIN 2.6* 2.5* 3.0*    CBG: Recent Labs  Lab 10/23/21 1243 10/26/21 0708 10/26/21 1626  GLUCAP 143* 89 104*     Recent Results (from the past 240 hour(s))  SARS CORONAVIRUS 2 (TAT 6-24 HRS) Nasopharyngeal Nasopharyngeal Swab     Status: None   Collection Time: 10/19/21  9:56 AM   Specimen: Nasopharyngeal Swab  Result Value Ref Range Status   SARS Coronavirus 2 NEGATIVE NEGATIVE Final    Comment: (NOTE) SARS-CoV-2 target nucleic acids are NOT DETECTED.  The SARS-CoV-2 RNA is generally detectable in upper and lower respiratory specimens during the acute phase of infection. Negative results do not preclude SARS-CoV-2 infection, do not rule out co-infections with other pathogens, and should not be used as the sole basis for treatment or other patient management decisions. Negative results must be combined with clinical observations, patient history, and epidemiological information. The expected result is Negative.  Fact Sheet for Patients: SugarRoll.be  Fact Sheet for Healthcare Providers: https://www.woods-mathews.com/  This test is not yet approved or cleared  by the [place] FDA and  has been authorized for detection and/or diagnosis of SARS-CoV-2 by FDA under an Emergency Use Authorization (EUA). This EUA will remain  in effect (meaning this test can be used) for the duration of the COVID-19 declaration under Se ction 564(b)(1) of the Act, 21 U.S.C. section 360bbb-3(b)(1), unless the authorization is terminated or revoked sooner.  Performed at [facility], [place] [address], [place], [place] [address]           Radiology Studies:    CT ABDOMEN PELVIS WO CONTRAST  Result Date: 10/27/2021 CLINICAL DATA:  Postop day 4 from ileostomy reversal. Vomiting. Evaluate for anastomotic leak. EXAM: CT ABDOMEN AND PELVIS WITHOUT CONTRAST TECHNIQUE: Multidetector CT  imaging of the abdomen and pelvis was performed following the standard protocol without IV contrast. RADIATION DOSE REDUCTION: This exam was performed according to the departmental dose-optimization program which includes automated exposure control, adjustment of the mA and/or kV according to patient size and/or use of iterative reconstruction technique. COMPARISON:  Abdominopelvic CT 11/05/2020.  Radiographs 10/26/2021. FINDINGS: Study was performed with the patient in the right lateral decubitus position. Rectal contrast was administered. Lower chest: Apparent right middle lobe collapse with a small amount of adjacent pleural fluid, incompletely visualized. Mild dependent atelectasis in both lower lobes. There is atherosclerosis of the aorta and coronary arteries. Hepatobiliary: Stable low-density liver lesions, likely cysts based on stability. Stable mild biliary dilatation status post cholecystectomy. Pancreas: Unremarkable. No pancreatic ductal dilatation or surrounding inflammatory changes. Spleen: Normal in size without focal abnormality. Adrenals/Urinary Tract: Both adrenal glands appear normal. Both kidneys demonstrate mild perinephric soft tissue stranding, similar to previous  study. No evidence of urinary tract calculus or hydronephrosis. The bladder appears unremarkable for its degree of distention. Stomach/Bowel: No oral contrast was administered. Rectal contrast has passed retrograde through the colon and through the ileocolonic anastomosis at the level of the mid transverse colon status post right hemicolectomy. There is retrograde filling of the distal small bowel. No anastomotic leak. The stomach and small bowel are fluid-filled and mildly distended. No evidence of bowel wall thickening or pneumatosis. Vascular/Lymphatic: There are no enlarged abdominal or pelvic lymph nodes. Diffuse aortic and branch vessel atherosclerosis. Reproductive: Stable appearance of the uterus and adnexa. No adnexal mass. Tubal ligation clips noted. Other: As seen on the postoperative radiographs, there is a large amount of soft tissue emphysema throughout the anterior abdominal wall which extends from the perineum into the presternal soft tissues. There are small focal fluid collections within the right and left anterior abdominal wall, likely related to recent laparoscopic procedure. No free intraperitoneal air, ascites or focal extraluminal fluid collections identified within the peritoneal cavity. Musculoskeletal: No acute or significant osseous findings. Stable chronic mild superior endplate compression fractures at L3 and L5. IMPRESSION: 1. Status post recent ileostomy reversal and ileocolonic anastomosis. The anastomosis is patent, without leak. 2. Nonspecific gastric and proximal small bowel distension without focal transition point, likely related to a postoperative ileus. Recommend radiographic follow-up. Patient may benefit from a nasogastric tube. 3. As seen on postoperative radiographs, there is extensive soft tissue emphysema throughout the abdominal wall which is likely related to the laparoscopic procedure. Soft tissue infection should be excluded clinically. Small fluid collections in the  anterior abdominal wall are attributed to the procedure, and no unexpected collections are identified. There is no pneumoperitoneum or abnormal intra-abdominal fluid collection. 4. Atelectasis at both lung bases with apparent partial right middle lobe collapse. 5. Extensive Aortic Atherosclerosis (ICD10-I70.0). Electronically Signed   By: Richardean Sale M.D.   On: 10/27/2021 16:28   DG Chest 1 View  Result Date: 10/26/2021 CLINICAL DATA:  Hypotension EXAM: CHEST  1 VIEW COMPARISON:  11/16/2020, 06/05/2020, PET CT 12/01/2019, CT chest 11/06/2019, 11/06/2019 FINDINGS: Mild cardiomegaly with central vascular congestion. Small pleural effusions and diffuse increased interstitial opacity likely due to edema. Aortic atherosclerosis. No pneumothorax. Irregular opacity with mild distortion and nodularity in the right upper lobe and right hilus similar compared to intermittent previous exams and presumably due to scarring. IMPRESSION: 1. Cardiomegaly with vascular congestion, small pleural effusions and mild diffuse interstitial edema 2. Irregular and nodular opacities in the right hilus and upper lobe present on previous exams and  probably due to scarring Electronically Signed   By: Donavan Foil M.D.   On: 10/26/2021 17:54   DG Abd 1 View  Result Date: 10/26/2021 CLINICAL DATA:  Hypotension history of ileostomy take down EXAM: ABDOMEN - 1 VIEW COMPARISON:  11/05/2020 FINDINGS: Clips in the pelvis. Moderate air distension of the stomach. Air-filled dilated small bowel up to 3.5 cm in the central abdomen. Extensive mottled appearance of the abdominal soft tissues presumably due to soft tissue emphysema related to recent laparoscopic procedure. IMPRESSION: 1. Air distension of stomach and small bowel either due to ileus or possible obstruction. 2. Diffuse mottled appearance of soft tissues felt consistent with soft tissue emphysema likely due to recent laparoscopic procedure Electronically Signed   By: Donavan Foil  M.D.   On: 10/26/2021 17:51   ECHOCARDIOGRAM COMPLETE  Result Date: 10/27/2021    ECHOCARDIOGRAM REPORT   Patient Name:   DOMANIQUE HUESMAN [facility] Date of Exam: 10/27/2021 Medical Rec #:  [id number]       Height:       66.0 in Accession #:    [id number]      Weight:       124.0 lb Date of Birth:  [birth year]       BSA:          62.632 m Patient Age:    60 years        BP:           144/84 mmHg Patient Gender: F               HR:           95 bpm. Exam Location:  [facility] Procedure: 2D Echo, Cardiac Doppler and Color Doppler Indications:     Elevated troponin  History:         Patient has prior history of Echocardiogram examinations, most                  recent 11/07/2019. COPD, Arrythmias:Atrial Fibrillation,                  Signs/Symptoms:Murmur; Risk Factors:Hypertension.  Sonographer:     Sherrie Sport Referring Phys:  [place] Diagnosing Phys: Donnelly Angelica IMPRESSIONS  1. Left ventricular ejection fraction, by estimation, is 60 to 65%. The left ventricle has normal function. The left ventricle has no regional wall motion abnormalities. Left ventricular diastolic parameters are consistent with Grade I diastolic dysfunction (impaired relaxation).  2. Right ventricular systolic function is normal. The right ventricular size is mildly enlarged.  3. Right atrial size was mildly dilated.  4. The mitral valve is normal in structure. Moderate mitral valve regurgitation. No evidence of mitral stenosis.  5. Tricuspid valve regurgitation is moderate.  6. The aortic valve is calcified. Aortic valve regurgitation is not visualized. Mild aortic valve stenosis.  7. The inferior vena cava is normal in size with greater than 50% respiratory variability, suggesting right atrial pressure of 3 mmHg. Conclusion(s)/Recommendation(s): CALCIFIED AORTIC VALVE WITH MILD AS. VMax 2.1 m/s. Dimensionless index 0.4. FINDINGS  Left Ventricle: Left ventricular ejection fraction, by estimation, is 60 to 65%. The left ventricle has normal function.  The left ventricle has no regional wall motion abnormalities. The left ventricular internal cavity size was normal in size. There is  no left ventricular hypertrophy. Left ventricular diastolic parameters are consistent with Grade I diastolic dysfunction (impaired relaxation). Right Ventricle: The right ventricular size is mildly enlarged. No increase in right ventricular wall thickness. Right  ventricular systolic function is normal. Left Atrium: Left atrial size was normal in size. Right Atrium: Right atrial size was mildly dilated. Pericardium: There is no evidence of pericardial effusion. Mitral Valve: The mitral valve is normal in structure. Moderate mitral valve regurgitation. No evidence of mitral valve stenosis. MV peak gradient, 3.1 mmHg. The mean mitral valve gradient is 1.0 mmHg. Tricuspid Valve: The tricuspid valve is normal in structure. Tricuspid valve regurgitation is moderate . No evidence of tricuspid stenosis. Aortic Valve: The aortic valve is calcified. Aortic valve regurgitation is not visualized. Mild aortic stenosis is present. Aortic valve mean gradient measures 9.4 mmHg. Aortic valve peak gradient measures 16.8 mmHg. Aortic valve area, by VTI measures 1.21 cm. Pulmonic Valve: The pulmonic valve was not well visualized. Pulmonic valve regurgitation is not visualized. No evidence of pulmonic stenosis. Aorta: The aortic root is normal in size and structure. Venous: The inferior vena cava is normal in size with greater than 50% respiratory variability, suggesting right atrial pressure of 3 mmHg. IAS/Shunts: No atrial level shunt detected by color flow Doppler.  LEFT VENTRICLE PLAX 2D LVIDd:         4.80 cm   Diastology LVIDs:         3.00 cm   LV e' medial:    5.87 cm/s LV PW:         1.10 cm   LV E/e' medial:  13.9 LV IVS:        0.75 cm   LV e' lateral:   12.40 cm/s LVOT diam:     2.00 cm   LV E/e' lateral: 6.6 LV SV:         45 LV SV Index:   28 LVOT Area:     3.14 cm  RIGHT VENTRICLE RV  Basal diam:  3.80 cm RV S prime:     12.20 cm/s TAPSE (M-mode): 1.8 cm LEFT ATRIUM             Index        RIGHT ATRIUM           Index LA diam:        3.20 cm 1.96 cm/m   RA Area:     19.30 cm LA Vol (A2C):   58.8 ml 36.03 ml/m  RA Volume:   63.90 ml  39.15 ml/m LA Vol (A4C):   38.7 ml 23.71 ml/m LA Biplane Vol: 49.1 ml 30.08 ml/m  AORTIC VALVE                     PULMONIC VALVE AV Area (Vmax):    1.04 cm      PV Vmax:        0.59 m/s AV Area (Vmean):   0.91 cm      PV Vmean:       35.200 cm/s AV Area (VTI):     1.21 cm      PV VTI:         0.109 m AV Vmax:           205.20 cm/s   PV Peak grad:   1.4 mmHg AV Vmean:          140.200 cm/s  PV Mean grad:   1.0 mmHg AV VTI:            0.373 m       RVOT Peak grad: 4 mmHg AV Peak Grad:      16.8 mmHg AV Mean Grad:  9.4 mmHg LVOT Vmax:         67.90 cm/s LVOT Vmean:        40.500 cm/s LVOT VTI:          0.144 m LVOT/AV VTI ratio: 0.39  AORTA Ao Root diam: 3.17 cm MITRAL VALVE               TRICUSPID VALVE MV Area (PHT): 4.86 cm    TR Peak grad:   52.4 mmHg MV Area VTI:   3.01 cm    TR Vmax:        362.00 cm/s MV Peak grad:  3.1 mmHg MV Mean grad:  1.0 mmHg    SHUNTS MV Vmax:       0.88 m/s    Systemic VTI:  0.14 m MV Vmean:      55.0 cm/s   Systemic Diam: 2.00 cm MV Decel Time: 156 msec    Pulmonic VTI:  0.157 m MV E velocity: 81.40 cm/s MV A velocity: 51.40 cm/s MV E/A ratio:  1.58 Donnelly Angelica Electronically signed by Donnelly Angelica Signature Date/Time: 10/27/2021/4:07:10 PM    Final         Medications:    Scheduled Meds:  apixaban  2.5 mg Oral BID   citalopram  40 mg Oral [id number]   feeding supplement  237 mL Oral TID BM   fluticasone furoate-vilanterol  1 puff Inhalation Daily   And   umeclidinium bromide  1 puff Inhalation Daily   gabapentin  300 mg Oral BID   midodrine  10 mg Oral TID WC   multivitamin with minerals  1 tablet Oral Daily   pantoprazole (PROTONIX) IV  40 mg Intravenous QHS   Continuous Infusions:   sodium bicarbonate  (isotonic) infusion in sterile water 75 mL/hr at 10/28/21 0631       LOS: 5 days    Time spent: 35 minutes    Desma Maxim, MD [facility]   To contact the attending provider between 7A-7P or the covering provider during after hours 7P-7A, please log into the web site [email or web address] and access using universal [place] password for that web site. If you do not have the password, please call the hospital operator.  10/28/2021, 1:41 PM

## 2021-10-28 NOTE — Progress Notes (Signed)
[facility]  ROUNDING NOTE   Subjective:   Creatinine 1.42 (2.11) UOP 857mL  Patient complains of left lower quadrant abdominal pain.   PRBC transfusion yesterday.    Objective:  Vital signs in last 24 hours:  Temp:  [97.9 F (36.6 C)-99.3 F (37.4 C)] 98.6 F (37 C) (01/14 0729) Pulse Rate:  [91-106] 106 (01/14 0729) Resp:  [16-20] 18 (01/14 0729) BP: (130-155)/(72-97) 130/80 (01/14 0729) SpO2:  [90 %-95 %] 95 % (01/14 0729)  Weight change:  Filed Weights   10/23/21 0609  Weight: 56.2 kg    Intake/Output: I/O last 3 completed shifts: In: 2578.7 [I.V.:2254.7; Blood:324] Out: 8341 [Urine:1000; Emesis/NG output:50; DQQIW:979]   Intake/Output this shift:  No intake/output data recorded.  Physical Exam: General: NAD, laying in bed  Head: Normocephalic, atraumatic. Moist oral mucosal membranes  Eyes: Anicteric  Lungs:  Clear to auscultation, normal effort  Heart: Regular rate and rhythm  Abdomen:  +LLQ tenderness  Extremities: No peripheral edema.  Neurologic: Nonfocal, moving all four extremities  Skin: No lesions       Basic Metabolic Panel: Recent Labs  Lab 10/24/21 0431 10/25/21 0436 10/26/21 0429 10/26/21 1657 10/27/21 0442 10/28/21 0614  NA 129* 131* 131* 134* 137 135  K 4.8 4.6 4.6 5.0 4.0 3.5  CL 108 110 110 109 106 95*  CO2 15* 14* 16* 18* 21* 26  GLUCOSE 155* 108* 92 109* 103* 94  BUN 32* 32* 33* 35* 29* 21  CREATININE 2.58* 3.25* 3.00* 2.79* 2.11* 1.42*  CALCIUM 8.1* 7.7* 7.8* 8.3* 8.4* 8.3*  MG 1.9  --   --   --   --   --   PHOS 4.7*  --  3.2  --  3.2  --      Liver Function Tests: Recent Labs  Lab 10/25/21 0436 10/26/21 0429 10/27/21 0442  AST 19  --   --   ALT 9  --   --   ALKPHOS 47  --   --   BILITOT 0.2*  --   --   PROT 6.5  --   --   ALBUMIN 2.6* 2.5* 3.0*    No results for input(s): LIPASE, AMYLASE in the last 168 hours. No results for input(s): AMMONIA in the last 168 hours.  CBC: Recent Labs  Lab  10/24/21 0431 10/25/21 0436 10/26/21 0429 10/26/21 1657 10/27/21 0442 10/28/21 0614  WBC 19.3* 10.2  --  8.7  --  10.1  NEUTROABS  --   --   --  6.8  --   --   HGB 8.0* 7.3* 7.2* 8.0* 6.8* 8.5*  HCT 24.7* 24.1* 22.2* 25.7* 21.5* 25.4*  MCV 93.2 99.2  --  97.7  --  93.0  PLT 416* 390  --  411*  --  372     Cardiac Enzymes: No results for input(s): CKTOTAL, CKMB, CKMBINDEX, TROPONINI in the last 168 hours.  BNP: Invalid input(s): POCBNP  CBG: Recent Labs  Lab 10/23/21 1243 10/26/21 0708 10/26/21 1626  GLUCAP 143* 89 104*     Microbiology: Results for orders placed or performed during the hospital encounter of 10/19/21  SARS CORONAVIRUS 2 (TAT 6-24 HRS) Nasopharyngeal Nasopharyngeal Swab     Status: None   Collection Time: 10/19/21  9:56 AM   Specimen: Nasopharyngeal Swab  Result Value Ref Range Status   SARS Coronavirus 2 NEGATIVE NEGATIVE Final    Comment: (NOTE) SARS-CoV-2 target nucleic acids are NOT DETECTED.  The SARS-CoV-2 RNA is  generally detectable in upper and lower respiratory specimens during the acute phase of infection. Negative results do not preclude SARS-CoV-2 infection, do not rule out co-infections with other pathogens, and should not be used as the sole basis for treatment or other patient management decisions. Negative results must be combined with clinical observations, patient history, and epidemiological information. The expected result is Negative.  Fact Sheet for Patients: SugarRoll.be  Fact Sheet for Healthcare Providers: https://www.woods-mathews.com/  This test is not yet approved or cleared by the [place] FDA and  has been authorized for detection and/or diagnosis of SARS-CoV-2 by FDA under an Emergency Use Authorization (EUA). This EUA will remain  in effect (meaning this test can be used) for the duration of the COVID-19 declaration under Se ction 564(b)(1) of the Act, 21  U.S.C. section 360bbb-3(b)(1), unless the authorization is terminated or revoked sooner.  Performed at [facility], [place] [address] [place], [place] [address]     Coagulation Studies: No results for input(s): LABPROT, INR in the last 72 hours.  Urinalysis: No results for input(s): COLORURINE, LABSPEC, PHURINE, GLUCOSEU, HGBUR, BILIRUBINUR, KETONESUR, PROTEINUR, UROBILINOGEN, NITRITE, LEUKOCYTESUR in the last 72 hours.  Invalid input(s): APPERANCEUR    Imaging: CT ABDOMEN PELVIS WO CONTRAST  Result Date: 10/27/2021 CLINICAL DATA:  Postop day 4 from ileostomy reversal. Vomiting. Evaluate for anastomotic leak. EXAM: CT ABDOMEN AND PELVIS WITHOUT CONTRAST TECHNIQUE: Multidetector CT imaging of the abdomen and pelvis was performed following the standard protocol without IV contrast. RADIATION DOSE REDUCTION: This exam was performed according to the departmental dose-optimization program which includes automated exposure control, adjustment of the mA and/or kV according to patient size and/or use of iterative reconstruction technique. COMPARISON:  Abdominopelvic CT 11/05/2020.  Radiographs 10/26/2021. FINDINGS: Study was performed with the patient in the right lateral decubitus position. Rectal contrast was administered. Lower chest: Apparent right middle lobe collapse with a small amount of adjacent pleural fluid, incompletely visualized. Mild dependent atelectasis in both lower lobes. There is atherosclerosis of the aorta and coronary arteries. Hepatobiliary: Stable low-density liver lesions, likely cysts based on stability. Stable mild biliary dilatation status post cholecystectomy. Pancreas: Unremarkable. No pancreatic ductal dilatation or surrounding inflammatory changes. Spleen: Normal in size without focal abnormality. Adrenals/Urinary Tract: Both adrenal glands appear normal. Both kidneys demonstrate mild perinephric soft tissue stranding, similar to previous study. No evidence of  urinary tract calculus or hydronephrosis. The bladder appears unremarkable for its degree of distention. Stomach/Bowel: No oral contrast was administered. Rectal contrast has passed retrograde through the colon and through the ileocolonic anastomosis at the level of the mid transverse colon status post right hemicolectomy. There is retrograde filling of the distal small bowel. No anastomotic leak. The stomach and small bowel are fluid-filled and mildly distended. No evidence of bowel wall thickening or pneumatosis. Vascular/Lymphatic: There are no enlarged abdominal or pelvic lymph nodes. Diffuse aortic and branch vessel atherosclerosis. Reproductive: Stable appearance of the uterus and adnexa. No adnexal mass. Tubal ligation clips noted. Other: As seen on the postoperative radiographs, there is a large amount of soft tissue emphysema throughout the anterior abdominal wall which extends from the perineum into the presternal soft tissues. There are small focal fluid collections within the right and left anterior abdominal wall, likely related to recent laparoscopic procedure. No free intraperitoneal air, ascites or focal extraluminal fluid collections identified within the peritoneal cavity. Musculoskeletal: No acute or significant osseous findings. Stable chronic mild superior endplate compression fractures at L3 and L5. IMPRESSION: 1. Status  post recent ileostomy reversal and ileocolonic anastomosis. The anastomosis is patent, without leak. 2. Nonspecific gastric and proximal small bowel distension without focal transition point, likely related to a postoperative ileus. Recommend radiographic follow-up. Patient may benefit from a nasogastric tube. 3. As seen on postoperative radiographs, there is extensive soft tissue emphysema throughout the abdominal wall which is likely related to the laparoscopic procedure. Soft tissue infection should be excluded clinically. Small fluid collections in the anterior abdominal  wall are attributed to the procedure, and no unexpected collections are identified. There is no pneumoperitoneum or abnormal intra-abdominal fluid collection. 4. Atelectasis at both lung bases with apparent partial right middle lobe collapse. 5. Extensive Aortic Atherosclerosis (ICD10-I70.0). Electronically Signed   By: Richardean Sale M.D.   On: 10/27/2021 16:28   DG Chest 1 View  Result Date: 10/26/2021 CLINICAL DATA:  Hypotension EXAM: CHEST  1 VIEW COMPARISON:  11/16/2020, 06/05/2020, PET CT 12/01/2019, CT chest 11/06/2019, 11/06/2019 FINDINGS: Mild cardiomegaly with central vascular congestion. Small pleural effusions and diffuse increased interstitial opacity likely due to edema. Aortic atherosclerosis. No pneumothorax. Irregular opacity with mild distortion and nodularity in the right upper lobe and right hilus similar compared to intermittent previous exams and presumably due to scarring. IMPRESSION: 1. Cardiomegaly with vascular congestion, small pleural effusions and mild diffuse interstitial edema 2. Irregular and nodular opacities in the right hilus and upper lobe present on previous exams and probably due to scarring Electronically Signed   By: Donavan Foil M.D.   On: 10/26/2021 17:54   DG Abd 1 View  Result Date: 10/26/2021 CLINICAL DATA:  Hypotension history of ileostomy take down EXAM: ABDOMEN - 1 VIEW COMPARISON:  11/05/2020 FINDINGS: Clips in the pelvis. Moderate air distension of the stomach. Air-filled dilated small bowel up to 3.5 cm in the central abdomen. Extensive mottled appearance of the abdominal soft tissues presumably due to soft tissue emphysema related to recent laparoscopic procedure. IMPRESSION: 1. Air distension of stomach and small bowel either due to ileus or possible obstruction. 2. Diffuse mottled appearance of soft tissues felt consistent with soft tissue emphysema likely due to recent laparoscopic procedure Electronically Signed   By: Donavan Foil M.D.   On:  10/26/2021 17:51   ECHOCARDIOGRAM COMPLETE  Result Date: 10/27/2021    ECHOCARDIOGRAM REPORT   Patient Name:   Danielle [name] Date of Exam: 10/27/2021 Medical Rec #:  [id number]       Height:       66.0 in Accession #:    [id number]      Weight:       124.0 lb Date of Birth:  [birth year]       BSA:          103.632 m Patient Age:    37 years        BP:           144/84 mmHg Patient Gender: F               HR:           95 bpm. Exam Location:  [facility] Procedure: 2D Echo, Cardiac Doppler and Color Doppler Indications:     Elevated troponin  History:         Patient has prior history of Echocardiogram examinations, most                  recent 11/07/2019. COPD, Arrythmias:Atrial Fibrillation,  Signs/Symptoms:Murmur; Risk Factors:Hypertension.  Sonographer:     Sherrie Sport Referring Phys:  [place] Diagnosing Phys: Donnelly Angelica IMPRESSIONS  1. Left ventricular ejection fraction, by estimation, is 60 to 65%. The left ventricle has normal function. The left ventricle has no regional wall motion abnormalities. Left ventricular diastolic parameters are consistent with Grade I diastolic dysfunction (impaired relaxation).  2. Right ventricular systolic function is normal. The right ventricular size is mildly enlarged.  3. Right atrial size was mildly dilated.  4. The mitral valve is normal in structure. Moderate mitral valve regurgitation. No evidence of mitral stenosis.  5. Tricuspid valve regurgitation is moderate.  6. The aortic valve is calcified. Aortic valve regurgitation is not visualized. Mild aortic valve stenosis.  7. The inferior vena cava is normal in size with greater than 50% respiratory variability, suggesting right atrial pressure of 3 mmHg. Conclusion(s)/Recommendation(s): CALCIFIED AORTIC VALVE WITH MILD AS. VMax 2.1 m/s. Dimensionless index 0.4. FINDINGS  Left Ventricle: Left ventricular ejection fraction, by estimation, is 60 to 65%. The left ventricle has normal function. The left  ventricle has no regional wall motion abnormalities. The left ventricular internal cavity size was normal in size. There is  no left ventricular hypertrophy. Left ventricular diastolic parameters are consistent with Grade I diastolic dysfunction (impaired relaxation). Right Ventricle: The right ventricular size is mildly enlarged. No increase in right ventricular wall thickness. Right ventricular systolic function is normal. Left Atrium: Left atrial size was normal in size. Right Atrium: Right atrial size was mildly dilated. Pericardium: There is no evidence of pericardial effusion. Mitral Valve: The mitral valve is normal in structure. Moderate mitral valve regurgitation. No evidence of mitral valve stenosis. MV peak gradient, 3.1 mmHg. The mean mitral valve gradient is 1.0 mmHg. Tricuspid Valve: The tricuspid valve is normal in structure. Tricuspid valve regurgitation is moderate . No evidence of tricuspid stenosis. Aortic Valve: The aortic valve is calcified. Aortic valve regurgitation is not visualized. Mild aortic stenosis is present. Aortic valve mean gradient measures 9.4 mmHg. Aortic valve peak gradient measures 16.8 mmHg. Aortic valve area, by VTI measures 1.21 cm. Pulmonic Valve: The pulmonic valve was not well visualized. Pulmonic valve regurgitation is not visualized. No evidence of pulmonic stenosis. Aorta: The aortic root is normal in size and structure. Venous: The inferior vena cava is normal in size with greater than 50% respiratory variability, suggesting right atrial pressure of 3 mmHg. IAS/Shunts: No atrial level shunt detected by color flow Doppler.  LEFT VENTRICLE PLAX 2D LVIDd:         4.80 cm   Diastology LVIDs:         3.00 cm   LV e' medial:    5.87 cm/s LV PW:         1.10 cm   LV E/e' medial:  13.9 LV IVS:        0.75 cm   LV e' lateral:   12.40 cm/s LVOT diam:     2.00 cm   LV E/e' lateral: 6.6 LV SV:         45 LV SV Index:   28 LVOT Area:     3.14 cm  RIGHT VENTRICLE RV Basal diam:   3.80 cm RV S prime:     12.20 cm/s TAPSE (M-mode): 1.8 cm LEFT ATRIUM             Index        RIGHT ATRIUM  Index LA diam:        3.20 cm 1.96 cm/m   RA Area:     19.30 cm LA Vol (A2C):   58.8 ml 36.03 ml/m  RA Volume:   63.90 ml  39.15 ml/m LA Vol (A4C):   38.7 ml 23.71 ml/m LA Biplane Vol: 49.1 ml 30.08 ml/m  AORTIC VALVE                     PULMONIC VALVE AV Area (Vmax):    1.04 cm      PV Vmax:        0.59 m/s AV Area (Vmean):   0.91 cm      PV Vmean:       35.200 cm/s AV Area (VTI):     1.21 cm      PV VTI:         0.109 m AV Vmax:           205.20 cm/s   PV Peak grad:   1.4 mmHg AV Vmean:          140.200 cm/s  PV Mean grad:   1.0 mmHg AV VTI:            0.373 m       RVOT Peak grad: 4 mmHg AV Peak Grad:      16.8 mmHg AV Mean Grad:      9.4 mmHg LVOT Vmax:         67.90 cm/s LVOT Vmean:        40.500 cm/s LVOT VTI:          0.144 m LVOT/AV VTI ratio: 0.39  AORTA Ao Root diam: 3.17 cm MITRAL VALVE               TRICUSPID VALVE MV Area (PHT): 4.86 cm    TR Peak grad:   52.4 mmHg MV Area VTI:   3.01 cm    TR Vmax:        362.00 cm/s MV Peak grad:  3.1 mmHg MV Mean grad:  1.0 mmHg    SHUNTS MV Vmax:       0.88 m/s    Systemic VTI:  0.14 m MV Vmean:      55.0 cm/s   Systemic Diam: 2.00 cm MV Decel Time: 156 msec    Pulmonic VTI:  0.157 m MV E velocity: 81.40 cm/s MV A velocity: 51.40 cm/s MV E/A ratio:  1.58 Donnelly Angelica Electronically signed by Donnelly Angelica Signature Date/Time: 10/27/2021/4:07:10 PM    Final      Medications:    dextrose 5 % and 0.45 % NaCl with KCl 20 mEq/L      apixaban  2.5 mg Oral BID   citalopram  40 mg Oral [id number]   feeding supplement  237 mL Oral TID BM   fluticasone furoate-vilanterol  1 puff Inhalation Daily   And   umeclidinium bromide  1 puff Inhalation Daily   gabapentin  300 mg Oral BID   multivitamin with minerals  1 tablet Oral Daily   pantoprazole (PROTONIX) IV  40 mg Intravenous QHS   albuterol, metoprolol tartrate, ondansetron **OR** ondansetron  (ZOFRAN) IV, oxyCODONE-acetaminophen  Assessment/ Plan:  [name] is a 66 y.o. black female with diabetes mellitus type II, hypertension, lymphedema, breast cancer, COPD who is admitted to [facility] on 10/23/2021 for Ileostomy status [facility]) [Z93.2]  Patient underwent ileostomy reversal on 1/9 by Dr. Peyton Najjar. Nephrology consulted for acute kidney  injury.   Acute Kidney Injury on chronic kidney disease stage IV with baseline creatinine 2.03 and GFR of 26 on 09/16/21. Acute kidney injury secondary to hypotension from procedure. Renal ultrasound ordered to evaluate obstruction. No IV contrast exposure. No acute need for dialysis at this time.   Hypertension with chronic kidney disease: with history of atrial fibrillation. Home regimen of torsemide, carvedilol, amlodipine.   Anemia with chronic kidney disease: status post PRBC transfusion.    LOS: 5 Danielle [name] 1/14/20231:56 PM

## 2021-10-28 NOTE — Progress Notes (Signed)
Mobility Specialist - Progress Note   10/28/21 1537  Mobility  Activity Transferred to/from [facility]  Level of Assistance Standby assist, set-up cues, supervision of patient - no hands on  Assistive Device BSC  Distance Ambulated (ft) 2 ft  Mobility Out of bed for toileting  Mobility Response Tolerated well  Mobility performed by Mobility specialist  $Mobility charge 1 Mobility    Pt sat EOB with minA. SPT to [facility] for urinary output. Pt returned to bed with supervision, alarm set, needs in reach.    Kathee Delton Mobility Specialist 10/28/21, 3:38 PM

## 2021-10-28 NOTE — Progress Notes (Signed)
Mobility Specialist - Progress Note   10/28/21 1300  Mobility  Activity Dangled on edge of bed  Level of Assistance Minimal assist, patient does 75% or more  Assistive Device None  Distance Ambulated (ft) 0 ft  Mobility Sit up in bed/chair position for meals  Mobility Response Tolerated well  Mobility performed by Mobility specialist  $Mobility charge 1 Mobility     2nd attempt this date. Pt asked to return after lunch this AM. Pt lying in bed upon arrival. Agreeable to light activity. 4/10 pain in R side. Pt sat EOB with minA and extra time. Further activity deferred d/t staff arriving for X-ray. Pt returned supine with supervision. Left in bed with alarm set. Will continue session another date/time.    Kathee Delton Mobility Specialist 10/28/21, 1:10 PM

## 2021-10-28 NOTE — Progress Notes (Signed)
Patient ID: Danielle [name], female   DOB: [birth year], 65 y.o.   MRN: [id number]     [facility] [place] [facility] Day(s): 5.   Interval History: Patient seen and examined, no acute events or new complaints overnight. Patient reports feeling sore in the abdomen.  Still with some nausea.  Still not passing gas.  Vital signs in last 24 hours: [min-max] current  Temp:  [97.8 F (36.6 C)-99.3 F (37.4 C)] 98.6 F (37 C) (01/14 0729) Pulse Rate:  [91-106] 106 (01/14 0729) Resp:  [16-20] 18 (01/14 0729) BP: (130-155)/(72-97) 130/80 (01/14 0729) SpO2:  [90 %-97 %] 95 % (01/14 0729)     Height: 5\' 6"  (167.6 cm) Weight: 56.2 kg BMI (Calculated): 20.02   Physical Exam:  Constitutional: alert, cooperative and no distress  Respiratory: breathing non-labored at rest  Cardiovascular: regular rate and sinus rhythm  Gastrointestinal: soft, mild-tender, and distended.  Wounds dry and clean  Labs:  CBC Latest Ref Rng & Units 10/28/2021 10/27/2021 10/26/2021  WBC 4.0 - 10.5 K/uL 10.1 - 8.7  Hemoglobin 12.0 - 15.0 g/dL 8.5(L) 6.8(L) 8.0(L)  Hematocrit 36.0 - 46.0 % 25.4(L) 21.5(L) 25.7(L)  Platelets 150 - 400 K/uL 372 - 411(H)   CMP Latest Ref Rng & Units 10/28/2021 10/27/2021 10/26/2021  Glucose 70 - 99 mg/dL 94 103(H) 109(H)  BUN 8 - 23 mg/dL 21 29(H) 35(H)  Creatinine 0.44 - 1.00 mg/dL 1.42(H) 2.11(H) 2.79(H)  Sodium 135 - 145 mmol/L 135 137 134(L)  Potassium 3.5 - 5.1 mmol/L 3.5 4.0 5.0  Chloride 98 - 111 mmol/L 95(L) 106 109  CO2 22 - 32 mmol/L 26 21(L) 18(L)  Calcium 8.9 - 10.3 mg/dL 8.3(L) 8.4(L) 8.3(L)  Total Protein 6.5 - 8.1 g/dL - - -  Total Bilirubin 0.3 - 1.2 mg/dL - - -  Alkaline Phos 38 - 126 U/L - - -  AST 15 - 41 U/L - - -  ALT 0 - 44 U/L - - -    Imaging studies: CT shows patent anastomosis without leak.  Intestinal dilation consistent with ileus.   Assessment/Plan:  66 y.o. female with history of an ileostomy and mucous fistula 5 Day Post-Op s/p ileostomy reversal,  complicated by pertinent comorbidities including COPD, atrial fibrillation on anticoagulation, hypertension, chronic kidney disease stage III.  Status post ileostomy reversal -Patient with postoperative ileus.  Cough seems to be the recent surgery and acute kidney injury.  No sign of infection, leg or abscess.  No significant electrolyte disturbance. -Hemoglobin this morning 8.5.  Adequate increase with confusion.  No sign of bleeding.  Will restart Eliquis.   Chronic kidney disease stage III -Acute kidney injury after surgery.  BUN and creatinine continuing decreasing trend.  Today creatinine decreased to 1.42 which is below her baseline. -Patient today more alert and willing to do physical therapy or at least get out of bed. -Appreciate hospitalist and nephrology evaluation and recommendations.  Arnold Long, MD

## 2021-10-29 DIAGNOSIS — N189 Chronic kidney disease, unspecified: Secondary | ICD-10-CM

## 2021-10-29 DIAGNOSIS — N179 Acute kidney failure, unspecified: Secondary | ICD-10-CM

## 2021-10-29 DIAGNOSIS — I480 Paroxysmal atrial fibrillation: Secondary | ICD-10-CM

## 2021-10-29 LAB — BASIC METABOLIC PANEL
Anion gap: 9 (ref 5–15)
BUN: 16 mg/dL (ref 8–23)
CO2: 32 mmol/L (ref 22–32)
Calcium: 8.1 mg/dL — ABNORMAL LOW (ref 8.9–10.3)
Chloride: 97 mmol/L — ABNORMAL LOW (ref 98–111)
Creatinine, Ser: 1.31 mg/dL — ABNORMAL HIGH (ref 0.44–1.00)
GFR, Estimated: 45 mL/min — ABNORMAL LOW (ref >60)
Glucose, Bld: 130 mg/dL — ABNORMAL HIGH (ref 70–99)
Potassium: 3.7 mmol/L (ref 3.5–5.1)
Sodium: 138 mmol/L (ref 135–145)

## 2021-10-29 LAB — CBC
HCT: 23.8 % — ABNORMAL LOW (ref 36.0–46.0)
Hemoglobin: 7.8 g/dL — ABNORMAL LOW (ref 12.0–15.0)
MCH: 31 pg (ref 26.0–34.0)
MCHC: 32.8 g/dL (ref 30.0–36.0)
MCV: 94.4 fL (ref 80.0–100.0)
Platelets: 341 10*3/uL (ref 150–400)
RBC: 2.52 MIL/uL — ABNORMAL LOW (ref 3.87–5.11)
RDW: 14.7 % (ref 11.5–15.5)
WBC: 9.2 10*3/uL (ref 4.0–10.5)
nRBC: 1.9 % — ABNORMAL HIGH (ref 0.0–0.2)

## 2021-10-29 LAB — VITAMIN B12: Vitamin B-12: 736 pg/mL (ref 180–914)

## 2021-10-29 LAB — IRON AND TIBC
Iron: 39 ug/dL (ref 28–170)
Saturation Ratios: 27 % (ref 10.4–31.8)
TIBC: 147 ug/dL — ABNORMAL LOW (ref 250–450)
UIBC: 108 ug/dL

## 2021-10-29 LAB — FERRITIN: Ferritin: 363 ng/mL — ABNORMAL HIGH (ref 11–307)

## 2021-10-29 MED ORDER — TORSEMIDE 20 MG PO TABS
20.0000 mg | ORAL_TABLET | Freq: Two times a day (BID) | ORAL | Status: DC
  Administered 2021-10-29 – 2021-10-30 (×2): 20 mg via ORAL
  Filled 2021-10-29 (×2): qty 1

## 2021-10-29 MED ORDER — HYDROMORPHONE HCL 1 MG/ML IJ SOLN
0.5000 mg | INTRAMUSCULAR | Status: DC | PRN
  Administered 2021-10-29 – 2021-11-09 (×40): 0.5 mg via INTRAVENOUS
  Filled 2021-10-29 (×42): qty 0.5

## 2021-10-29 MED ORDER — METOCLOPRAMIDE HCL 5 MG/ML IJ SOLN
5.0000 mg | Freq: Three times a day (TID) | INTRAMUSCULAR | Status: DC
  Administered 2021-10-29 – 2021-11-03 (×15): 5 mg via INTRAVENOUS
  Filled 2021-10-29 (×15): qty 2

## 2021-10-29 NOTE — Progress Notes (Signed)
Patient complained of pain throughout night from 7-10 out of 10. Dr. Hampton Abbot notified that patient received Percocet 5 mg at 1800, stated to go ahead and give another 5 mg since order permits 1-2 tablets and ordered Dilaudid 0.5 mg IV if needed which was given about 1 hour after percocet. Dilaudid was effective both times it was given during shift. Not passing gas yet. Bowel sounds hypoactive. Nauseous once, given zofran which was effective. 225 ml of NG output.

## 2021-10-29 NOTE — Progress Notes (Signed)
[facility]  ROUNDING NOTE   Subjective:   Creatinine 1.31 (1.42) (2.11) UOP 1028mL  NG tube placed yesterday. NG tube with 5103mL.   Objective:  Vital signs in last 24 hours:  Temp:  [97.5 F (36.4 C)-99.1 F (37.3 C)] 99.1 F (37.3 C) (01/15 0822) Pulse Rate:  [87-98] 87 (01/15 0822) Resp:  [16-20] 18 (01/15 0822) BP: (119-157)/(79-95) 119/82 (01/15 0822) SpO2:  [89 %-95 %] 95 % (01/15 0822)  Weight change:  Filed Weights   10/23/21 0609  Weight: 56.2 kg    Intake/Output: I/O last 3 completed shifts: In: 3135.1 [I.V.:3135.1] Out: 2425 [Urine:1850; Emesis/NG output:575]   Intake/Output this shift:  No intake/output data recorded.  Physical Exam: General: NAD, laying in bed  Head: Normocephalic, atraumatic. Moist oral mucosal membranes, +NGT  Eyes: Anicteric  Lungs:  Clear to auscultation, normal effort  Heart: Regular rate and rhythm  Abdomen:  +LLQ tenderness  Extremities: No peripheral edema.  Neurologic: Nonfocal, moving all four extremities  Skin: No lesions       Basic Metabolic Panel: Recent Labs  Lab 10/24/21 0431 10/25/21 0436 10/26/21 0429 10/26/21 1657 10/27/21 0442 10/28/21 0614 10/29/21 0553  NA 129*   < > 131* 134* 137 135 138  K 4.8   < > 4.6 5.0 4.0 3.5 3.7  CL 108   < > 110 109 106 95* 97*  CO2 15*   < > 16* 18* 21* 26 32  GLUCOSE 155*   < > 92 109* 103* 94 130*  BUN 32*   < > 33* 35* 29* 21 16  CREATININE 2.58*   < > 3.00* 2.79* 2.11* 1.42* 1.31*  CALCIUM 8.1*   < > 7.8* 8.3* 8.4* 8.3* 8.1*  MG 1.9  --   --   --   --   --   --   PHOS 4.7*  --  3.2  --  3.2  --   --    < > = values in this interval not displayed.     Liver Function Tests: Recent Labs  Lab 10/25/21 0436 10/26/21 0429 10/27/21 0442  AST 19  --   --   ALT 9  --   --   ALKPHOS 47  --   --   BILITOT 0.2*  --   --   PROT 6.5  --   --   ALBUMIN 2.6* 2.5* 3.0*    No results for input(s): LIPASE, AMYLASE in the last 168 hours. No results for  input(s): AMMONIA in the last 168 hours.  CBC: Recent Labs  Lab 10/24/21 0431 10/25/21 0436 10/26/21 0429 10/26/21 1657 10/27/21 0442 10/28/21 0614 10/29/21 0553  WBC 19.3* 10.2  --  8.7  --  10.1 9.2  NEUTROABS  --   --   --  6.8  --   --   --   HGB 8.0* 7.3* 7.2* 8.0* 6.8* 8.5* 7.8*  HCT 24.7* 24.1* 22.2* 25.7* 21.5* 25.4* 23.8*  MCV 93.2 99.2  --  97.7  --  93.0 94.4  PLT 416* 390  --  411*  --  372 341     Cardiac Enzymes: No results for input(s): CKTOTAL, CKMB, CKMBINDEX, TROPONINI in the last 168 hours.  BNP: Invalid input(s): POCBNP  CBG: Recent Labs  Lab 10/23/21 1243 10/26/21 0708 10/26/21 1626  GLUCAP 143* 89 104*     Microbiology: Results for orders placed or performed during the hospital encounter of 10/19/21  SARS CORONAVIRUS 2 (TAT  6-24 HRS) Nasopharyngeal Nasopharyngeal Swab     Status: None   Collection Time: 10/19/21  9:56 AM   Specimen: Nasopharyngeal Swab  Result Value Ref Range Status   SARS Coronavirus 2 NEGATIVE NEGATIVE Final    Comment: (NOTE) SARS-CoV-2 target nucleic acids are NOT DETECTED.  The SARS-CoV-2 RNA is generally detectable in upper and lower respiratory specimens during the acute phase of infection. Negative results do not preclude SARS-CoV-2 infection, do not rule out co-infections with other pathogens, and should not be used as the sole basis for treatment or other patient management decisions. Negative results must be combined with clinical observations, patient history, and epidemiological information. The expected result is Negative.  Fact Sheet for Patients: SugarRoll.be  Fact Sheet for Healthcare Providers: https://www.woods-mathews.com/  This test is not yet approved or cleared by the [place] FDA and  has been authorized for detection and/or diagnosis of SARS-CoV-2 by FDA under an Emergency Use Authorization (EUA). This EUA will remain  in effect (meaning this  test can be used) for the duration of the COVID-19 declaration under Se ction 564(b)(1) of the Act, 21 U.S.C. section 360bbb-3(b)(1), unless the authorization is terminated or revoked sooner.  Performed at [facility], [place] [address] [place], [place] [address]     Coagulation Studies: No results for input(s): LABPROT, INR in the last 72 hours.  Urinalysis: No results for input(s): COLORURINE, LABSPEC, PHURINE, GLUCOSEU, HGBUR, BILIRUBINUR, KETONESUR, PROTEINUR, UROBILINOGEN, NITRITE, LEUKOCYTESUR in the last 72 hours.  Invalid input(s): APPERANCEUR    Imaging: CT ABDOMEN PELVIS WO CONTRAST  Result Date: 10/27/2021 CLINICAL DATA:  Postop day 4 from ileostomy reversal. Vomiting. Evaluate for anastomotic leak. EXAM: CT ABDOMEN AND PELVIS WITHOUT CONTRAST TECHNIQUE: Multidetector CT imaging of the abdomen and pelvis was performed following the standard protocol without IV contrast. RADIATION DOSE REDUCTION: This exam was performed according to the departmental dose-optimization program which includes automated exposure control, adjustment of the mA and/or kV according to patient size and/or use of iterative reconstruction technique. COMPARISON:  Abdominopelvic CT 11/05/2020.  Radiographs 10/26/2021. FINDINGS: Study was performed with the patient in the right lateral decubitus position. Rectal contrast was administered. Lower chest: Apparent right middle lobe collapse with a small amount of adjacent pleural fluid, incompletely visualized. Mild dependent atelectasis in both lower lobes. There is atherosclerosis of the aorta and coronary arteries. Hepatobiliary: Stable low-density liver lesions, likely cysts based on stability. Stable mild biliary dilatation status post cholecystectomy. Pancreas: Unremarkable. No pancreatic ductal dilatation or surrounding inflammatory changes. Spleen: Normal in size without focal abnormality. Adrenals/Urinary Tract: Both adrenal glands appear normal. Both  kidneys demonstrate mild perinephric soft tissue stranding, similar to previous study. No evidence of urinary tract calculus or hydronephrosis. The bladder appears unremarkable for its degree of distention. Stomach/Bowel: No oral contrast was administered. Rectal contrast has passed retrograde through the colon and through the ileocolonic anastomosis at the level of the mid transverse colon status post right hemicolectomy. There is retrograde filling of the distal small bowel. No anastomotic leak. The stomach and small bowel are fluid-filled and mildly distended. No evidence of bowel wall thickening or pneumatosis. Vascular/Lymphatic: There are no enlarged abdominal or pelvic lymph nodes. Diffuse aortic and branch vessel atherosclerosis. Reproductive: Stable appearance of the uterus and adnexa. No adnexal mass. Tubal ligation clips noted. Other: As seen on the postoperative radiographs, there is a large amount of soft tissue emphysema throughout the anterior abdominal wall which extends from the perineum into the presternal soft tissues. There  are small focal fluid collections within the right and left anterior abdominal wall, likely related to recent laparoscopic procedure. No free intraperitoneal air, ascites or focal extraluminal fluid collections identified within the peritoneal cavity. Musculoskeletal: No acute or significant osseous findings. Stable chronic mild superior endplate compression fractures at L3 and L5. IMPRESSION: 1. Status post recent ileostomy reversal and ileocolonic anastomosis. The anastomosis is patent, without leak. 2. Nonspecific gastric and proximal small bowel distension without focal transition point, likely related to a postoperative ileus. Recommend radiographic follow-up. Patient may benefit from a nasogastric tube. 3. As seen on postoperative radiographs, there is extensive soft tissue emphysema throughout the abdominal wall which is likely related to the laparoscopic procedure.  Soft tissue infection should be excluded clinically. Small fluid collections in the anterior abdominal wall are attributed to the procedure, and no unexpected collections are identified. There is no pneumoperitoneum or abnormal intra-abdominal fluid collection. 4. Atelectasis at both lung bases with apparent partial right middle lobe collapse. 5. Extensive Aortic Atherosclerosis (ICD10-I70.0). Electronically Signed   By: Richardean Sale M.D.   On: 10/27/2021 16:28   DG Abd 1 View  Result Date: 10/28/2021 CLINICAL DATA:  NG tube placement EXAM: ABDOMEN - 1 VIEW COMPARISON:  10/26/2021 FINDINGS: Limited radiograph of the lower chest and upper abdomen was obtained for the purposes of enteric tube localization. Enteric tube is seen coursing below the diaphragm with distal tipterminating within the proximal stomach. Side port terminates within the distal esophagus. Similar degree of gaseous distension of large and small bowel within the visualized abdomen. IMPRESSION: Enteric tube with distal tip terminating within the proximal stomach, side port terminating within the distal esophagus. Recommend advancement 7-10 cm. Electronically Signed   By: Davina Poke D.O.   On: 10/28/2021 15:48   ECHOCARDIOGRAM COMPLETE  Result Date: 10/27/2021    ECHOCARDIOGRAM REPORT   Patient Name:   Danielle [name] [facility] Date of Exam: 10/27/2021 Medical Rec #:  [id number]       Height:       66.0 in Accession #:    [id number]      Weight:       124.0 lb Date of Birth:  10/07/1956       BSA:          58.632 m Patient Age:    66 years        BP:           144/84 mmHg Patient Gender: F               HR:           95 bpm. Exam Location:  [facility] Procedure: 2D Echo, Cardiac Doppler and Color Doppler Indications:     Elevated troponin  History:         Patient has prior history of Echocardiogram examinations, most                  recent 11/07/2019. COPD, Arrythmias:Atrial Fibrillation,                  Signs/Symptoms:Murmur; Risk  Factors:Hypertension.  Sonographer:     Sherrie Sport Referring Phys:  [place] Diagnosing Phys: Donnelly Angelica IMPRESSIONS  1. Left ventricular ejection fraction, by estimation, is 60 to 65%. The left ventricle has normal function. The left ventricle has no regional wall motion abnormalities. Left ventricular diastolic parameters are consistent with Grade I diastolic dysfunction (impaired relaxation).  2. Right ventricular systolic function is normal. The right ventricular  size is mildly enlarged.  3. Right atrial size was mildly dilated.  4. The mitral valve is normal in structure. Moderate mitral valve regurgitation. No evidence of mitral stenosis.  5. Tricuspid valve regurgitation is moderate.  6. The aortic valve is calcified. Aortic valve regurgitation is not visualized. Mild aortic valve stenosis.  7. The inferior vena cava is normal in size with greater than 50% respiratory variability, suggesting right atrial pressure of 3 mmHg. Conclusion(s)/Recommendation(s): CALCIFIED AORTIC VALVE WITH MILD AS. VMax 2.1 m/s. Dimensionless index 0.4. FINDINGS  Left Ventricle: Left ventricular ejection fraction, by estimation, is 60 to 65%. The left ventricle has normal function. The left ventricle has no regional wall motion abnormalities. The left ventricular internal cavity size was normal in size. There is  no left ventricular hypertrophy. Left ventricular diastolic parameters are consistent with Grade I diastolic dysfunction (impaired relaxation). Right Ventricle: The right ventricular size is mildly enlarged. No increase in right ventricular wall thickness. Right ventricular systolic function is normal. Left Atrium: Left atrial size was normal in size. Right Atrium: Right atrial size was mildly dilated. Pericardium: There is no evidence of pericardial effusion. Mitral Valve: The mitral valve is normal in structure. Moderate mitral valve regurgitation. No evidence of mitral valve stenosis. MV peak gradient, 3.1  mmHg. The mean mitral valve gradient is 1.0 mmHg. Tricuspid Valve: The tricuspid valve is normal in structure. Tricuspid valve regurgitation is moderate . No evidence of tricuspid stenosis. Aortic Valve: The aortic valve is calcified. Aortic valve regurgitation is not visualized. Mild aortic stenosis is present. Aortic valve mean gradient measures 9.4 mmHg. Aortic valve peak gradient measures 16.8 mmHg. Aortic valve area, by VTI measures 1.21 cm. Pulmonic Valve: The pulmonic valve was not well visualized. Pulmonic valve regurgitation is not visualized. No evidence of pulmonic stenosis. Aorta: The aortic root is normal in size and structure. Venous: The inferior vena cava is normal in size with greater than 50% respiratory variability, suggesting right atrial pressure of 3 mmHg. IAS/Shunts: No atrial level shunt detected by color flow Doppler.  LEFT VENTRICLE PLAX 2D LVIDd:         4.80 cm   Diastology LVIDs:         3.00 cm   LV e' medial:    5.87 cm/s LV PW:         1.10 cm   LV E/e' medial:  13.9 LV IVS:        0.75 cm   LV e' lateral:   12.40 cm/s LVOT diam:     2.00 cm   LV E/e' lateral: 6.6 LV SV:         45 LV SV Index:   28 LVOT Area:     3.14 cm  RIGHT VENTRICLE RV Basal diam:  3.80 cm RV S prime:     12.20 cm/s TAPSE (M-mode): 1.8 cm LEFT ATRIUM             Index        RIGHT ATRIUM           Index LA diam:        3.20 cm 1.96 cm/m   RA Area:     19.30 cm LA Vol (A2C):   58.8 ml 36.03 ml/m  RA Volume:   63.90 ml  39.15 ml/m LA Vol (A4C):   38.7 ml 23.71 ml/m LA Biplane Vol: 49.1 ml 30.08 ml/m  AORTIC VALVE  PULMONIC VALVE AV Area (Vmax):    1.04 cm      PV Vmax:        0.59 m/s AV Area (Vmean):   0.91 cm      PV Vmean:       35.200 cm/s AV Area (VTI):     1.21 cm      PV VTI:         0.109 m AV Vmax:           205.20 cm/s   PV Peak grad:   1.4 mmHg AV Vmean:          140.200 cm/s  PV Mean grad:   1.0 mmHg AV VTI:            0.373 m       RVOT Peak grad: 4 mmHg AV Peak Grad:       16.8 mmHg AV Mean Grad:      9.4 mmHg LVOT Vmax:         67.90 cm/s LVOT Vmean:        40.500 cm/s LVOT VTI:          0.144 m LVOT/AV VTI ratio: 0.39  AORTA Ao Root diam: 3.17 cm MITRAL VALVE               TRICUSPID VALVE MV Area (PHT): 4.86 cm    TR Peak grad:   52.4 mmHg MV Area VTI:   3.01 cm    TR Vmax:        362.00 cm/s MV Peak grad:  3.1 mmHg MV Mean grad:  1.0 mmHg    SHUNTS MV Vmax:       0.88 m/s    Systemic VTI:  0.14 m MV Vmean:      55.0 cm/s   Systemic Diam: 2.00 cm MV Decel Time: 156 msec    Pulmonic VTI:  0.157 m MV E velocity: 81.40 cm/s MV A velocity: 51.40 cm/s MV E/A ratio:  1.58 Donnelly Angelica Electronically signed by Donnelly Angelica Signature Date/Time: 10/27/2021/4:07:10 PM    Final      Medications:    dextrose 5 % and 0.45 % NaCl with KCl 20 mEq/L 75 mL/hr at 10/29/21 0409    apixaban  2.5 mg Oral BID   citalopram  40 mg Oral [id number]   feeding supplement  237 mL Oral TID BM   fluticasone furoate-vilanterol  1 puff Inhalation Daily   And   umeclidinium bromide  1 puff Inhalation Daily   gabapentin  300 mg Oral BID   multivitamin with minerals  1 tablet Oral Daily   pantoprazole (PROTONIX) IV  40 mg Intravenous QHS   albuterol, HYDROmorphone (DILAUDID) injection, metoprolol tartrate, ondansetron **OR** ondansetron (ZOFRAN) IV, oxyCODONE-acetaminophen  Assessment/ Plan:  Danielle [name] is a 66 y.o. black female with diabetes mellitus type II, hypertension, lymphedema, breast cancer, COPD who is admitted to [facility] on 10/23/2021 for Ileostomy status [facility]) [Z93.2]  Patient underwent ileostomy reversal on 1/9 by Dr. Peyton Najjar. Nephrology consulted for acute kidney injury. Follows with Dr. Holley Raring as outpatient for CKD stage IV.   Acute Kidney Injury on chronic kidney disease stage IV with baseline creatinine 2.03 and GFR of 26 on 09/16/21. Acute kidney injury secondary to hypotension from procedure. Renal ultrasound ordered to evaluate obstruction. No IV contrast exposure. No acute  need for dialysis at this time.   Hypertension with chronic kidney disease: with history of atrial fibrillation. Home regimen of torsemide, carvedilol,  amlodipine.   Anemia with chronic kidney disease: status post PRBC transfusion.   Kidney function is improving. Will sign off at this time. Will continue to monitor labs and chart daily. Please call with any questions. Patient will need outpatient follow up with Dr. Holley Raring, nephrology.    LOS: 6 Danielle [name] 1/15/202310:58 AM

## 2021-10-29 NOTE — Progress Notes (Signed)
PROGRESS NOTE    Danielle [name]  IOE:[id number] DOB: May 27, 1956 DOA: 10/23/2021 PCP: [facility]    Brief Narrative:  Danielle [name] is an 66 y.o. female with a past medical history of anemia, anxiety, aortic atherosclerosis, aortic valve stenosis, chronic atrial fibrillation, stage IV CKD, breast cancer with history of chemotherapy, history of radiotherapy, COPD, depression, diastolic dysfunction, GERD, hyperlipidemia, hypertension, lymphedema, small bowel obstruction, vitamin D deficiency who presented to the hospital for an colostomy reversal.  Hospitalist consulted for management of medical problems.     Assessment & Plan:   Principal Problem:   Ileostomy status ([place]) Active Problems:   Hyponatremia   Acute kidney injury superimposed on CKD (HCC)   HTN (hypertension)   AF (paroxysmal atrial fibrillation) (HCC)   Normocytic anemia   Depression   Hyperphosphatemia  Acute on chronic anemia. Acute blood loss anemia. Received 1 unit PRBC, no evidence of iron deficiency, B12 level pending.  Acute kidney injury Hyponatremia. Acute kidney injury on chronic kidney disease stage 4. Metabolic acidosis. Renal function has improved better than baseline.  Patient is on IV fluids due to n.p.o. status.  Acute hypoxemic respiratory failure. COPD. Patient still on 2 L oxygen, she is getting IV fluids.  She was taking torsemide at home, she is already taking other oral medicines, I will restart torsemide 20 mg twice a day.  Esophageal cancer. Status post curative radiation.  Paroxysmal atrial fibrillation. Continue Eliquis.  Postop ileus. Followed by general surgery.    DVT prophylaxis: Eliquis Code Status: full Family Communication:  Disposition Plan:      I/O last 3 completed shifts: In: 3135.1 [I.V.:3135.1] Out: 2425 [Urine:1850; Emesis/NG output:575] No intake/output data recorded.   Subjective: Patient doing well.  No significant abdominal  pain.  No nausea vomiting.  However, patient has not had a bowel movement. On 2 L oxygen, denies any short of breath No fever chills  No dysuria hematuria.  Objective: Vitals:   10/28/21 1500 10/28/21 2022 10/29/21 0402 10/29/21 0822  BP:  (!) 153/84 129/79 119/82  Pulse:  94 96 87  Resp:  20 16 18   Temp:  (!) 97.5 F (36.4 C) 98.1 F (36.7 C) 99.1 F (37.3 C)  TempSrc:  Oral Oral   SpO2: 95% 94% 95% 95%  Weight:      Height:        Intake/Output Summary (Last 24 hours) at 10/29/2021 1221 Last data filed at 10/29/2021 0600 Gross per 24 hour  Intake 1562.93 ml  Output 1475 ml  Net 87.93 ml   Filed Weights   10/23/21 0609  Weight: 56.2 kg    Examination:  General exam: Appears calm and comfortable  Respiratory system: Clear to auscultation. Respiratory effort normal. Cardiovascular system: S1 & S2 heard, RRR. No JVD, murmurs, rubs, gallops or clicks. No pedal edema. Gastrointestinal system: Abdomen is nondistended, soft and nontender. No organomegaly or masses felt.  Decreased bowel sounds. Central nervous system: Alert and oriented. No focal neurological deficits. Extremities: Symmetric 5 x 5 power. Skin: No rashes, lesions or ulcers Psychiatry: Judgement and insight appear normal. Mood & affect appropriate.     Data Reviewed: I have personally reviewed following labs and imaging studies  CBC: Recent Labs  Lab 10/24/21 0431 10/25/21 0436 10/26/21 0429 10/26/21 1657 10/27/21 0442 10/28/21 0614 10/29/21 0553  WBC 19.3* 10.2  --  8.7  --  10.1 9.2  NEUTROABS  --   --   --  6.8  --   --   --  HGB 8.0* 7.3* 7.2* 8.0* 6.8* 8.5* 7.8*  HCT 24.7* 24.1* 22.2* 25.7* 21.5* 25.4* 23.8*  MCV 93.2 99.2  --  97.7  --  93.0 94.4  PLT 416* 390  --  411*  --  372 161   Basic Metabolic Panel: Recent Labs  Lab 10/24/21 0431 10/25/21 0436 10/26/21 0429 10/26/21 1657 10/27/21 0442 10/28/21 0614 10/29/21 0553  NA 129*   < > 131* 134* 137 135 138  K 4.8   < > 4.6 5.0  4.0 3.5 3.7  CL 108   < > 110 109 106 95* 97*  CO2 15*   < > 16* 18* 21* 26 32  GLUCOSE 155*   < > 92 109* 103* 94 130*  BUN 32*   < > 33* 35* 29* 21 16  CREATININE 2.58*   < > 3.00* 2.79* 2.11* 1.42* 1.31*  CALCIUM 8.1*   < > 7.8* 8.3* 8.4* 8.3* 8.1*  MG 1.9  --   --   --   --   --   --   PHOS 4.7*  --  3.2  --  3.2  --   --    < > = values in this interval not displayed.   GFR: Estimated Creatinine Clearance: 37 mL/min (A) (by C-G formula based on SCr of 1.31 mg/dL (H)). Liver Function Tests: Recent Labs  Lab 10/25/21 0436 10/26/21 0429 10/27/21 0442  AST 19  --   --   ALT 9  --   --   ALKPHOS 47  --   --   BILITOT 0.2*  --   --   PROT 6.5  --   --   ALBUMIN 2.6* 2.5* 3.0*   No results for input(s): LIPASE, AMYLASE in the last 168 hours. No results for input(s): AMMONIA in the last 168 hours. Coagulation Profile: No results for input(s): INR, PROTIME in the last 168 hours. Cardiac Enzymes: No results for input(s): CKTOTAL, CKMB, CKMBINDEX, TROPONINI in the last 168 hours. BNP (last 3 results) No results for input(s): PROBNP in the last 8760 hours. HbA1C: No results for input(s): HGBA1C in the last 72 hours. CBG: Recent Labs  Lab 10/23/21 1243 10/26/21 0708 10/26/21 1626  GLUCAP 143* 89 104*   Lipid Profile: No results for input(s): CHOL, HDL, LDLCALC, TRIG, CHOLHDL, LDLDIRECT in the last 72 hours. Thyroid Function Tests: No results for input(s): TSH, T4TOTAL, FREET4, T3FREE, THYROIDAB in the last 72 hours. Anemia Panel: Recent Labs    10/29/21 0710  FERRITIN 363*  TIBC 147*  IRON 39   Sepsis Labs: No results for input(s): PROCALCITON, LATICACIDVEN in the last 168 hours.  No results found for this or any previous visit (from the past 240 hour(s)).       Radiology Studies: CT ABDOMEN PELVIS WO CONTRAST  Result Date: 10/27/2021 CLINICAL DATA:  Postop day 4 from ileostomy reversal. Vomiting. Evaluate for anastomotic leak. EXAM: CT ABDOMEN AND PELVIS  WITHOUT CONTRAST TECHNIQUE: Multidetector CT imaging of the abdomen and pelvis was performed following the standard protocol without IV contrast. RADIATION DOSE REDUCTION: This exam was performed according to the departmental dose-optimization program which includes automated exposure control, adjustment of the mA and/or kV according to patient size and/or use of iterative reconstruction technique. COMPARISON:  Abdominopelvic CT 11/05/2020.  Radiographs 10/26/2021. FINDINGS: Study was performed with the patient in the right lateral decubitus position. Rectal contrast was administered. Lower chest: Apparent right middle lobe collapse with a small amount of adjacent pleural  fluid, incompletely visualized. Mild dependent atelectasis in both lower lobes. There is atherosclerosis of the aorta and coronary arteries. Hepatobiliary: Stable low-density liver lesions, likely cysts based on stability. Stable mild biliary dilatation status post cholecystectomy. Pancreas: Unremarkable. No pancreatic ductal dilatation or surrounding inflammatory changes. Spleen: Normal in size without focal abnormality. Adrenals/Urinary Tract: Both adrenal glands appear normal. Both kidneys demonstrate mild perinephric soft tissue stranding, similar to previous study. No evidence of urinary tract calculus or hydronephrosis. The bladder appears unremarkable for its degree of distention. Stomach/Bowel: No oral contrast was administered. Rectal contrast has passed retrograde through the colon and through the ileocolonic anastomosis at the level of the mid transverse colon status post right hemicolectomy. There is retrograde filling of the distal small bowel. No anastomotic leak. The stomach and small bowel are fluid-filled and mildly distended. No evidence of bowel wall thickening or pneumatosis. Vascular/Lymphatic: There are no enlarged abdominal or pelvic lymph nodes. Diffuse aortic and branch vessel atherosclerosis. Reproductive: Stable  appearance of the uterus and adnexa. No adnexal mass. Tubal ligation clips noted. Other: As seen on the postoperative radiographs, there is a large amount of soft tissue emphysema throughout the anterior abdominal wall which extends from the perineum into the presternal soft tissues. There are small focal fluid collections within the right and left anterior abdominal wall, likely related to recent laparoscopic procedure. No free intraperitoneal air, ascites or focal extraluminal fluid collections identified within the peritoneal cavity. Musculoskeletal: No acute or significant osseous findings. Stable chronic mild superior endplate compression fractures at L3 and L5. IMPRESSION: 1. Status post recent ileostomy reversal and ileocolonic anastomosis. The anastomosis is patent, without leak. 2. Nonspecific gastric and proximal small bowel distension without focal transition point, likely related to a postoperative ileus. Recommend radiographic follow-up. Patient may benefit from a nasogastric tube. 3. As seen on postoperative radiographs, there is extensive soft tissue emphysema throughout the abdominal wall which is likely related to the laparoscopic procedure. Soft tissue infection should be excluded clinically. Small fluid collections in the anterior abdominal wall are attributed to the procedure, and no unexpected collections are identified. There is no pneumoperitoneum or abnormal intra-abdominal fluid collection. 4. Atelectasis at both lung bases with apparent partial right middle lobe collapse. 5. Extensive Aortic Atherosclerosis (ICD10-I70.0). Electronically Signed   By: Richardean Sale M.D.   On: 10/27/2021 16:28   DG Abd 1 View  Result Date: 10/28/2021 CLINICAL DATA:  NG tube placement EXAM: ABDOMEN - 1 VIEW COMPARISON:  10/26/2021 FINDINGS: Limited radiograph of the lower chest and upper abdomen was obtained for the purposes of enteric tube localization. Enteric tube is seen coursing below the diaphragm  with distal tipterminating within the proximal stomach. Side port terminates within the distal esophagus. Similar degree of gaseous distension of large and small bowel within the visualized abdomen. IMPRESSION: Enteric tube with distal tip terminating within the proximal stomach, side port terminating within the distal esophagus. Recommend advancement 7-10 cm. Electronically Signed   By: Davina Poke D.O.   On: 10/28/2021 15:48   ECHOCARDIOGRAM COMPLETE  Result Date: 10/27/2021    ECHOCARDIOGRAM REPORT   Patient Name:   BRECKLYN GALVIS [facility] Date of Exam: 10/27/2021 Medical Rec #:  [id number]       Height:       66.0 in Accession #:    [id number]      Weight:       124.0 lb Date of Birth:  11-27-1955       BSA:  1.632 m Patient Age:    43 years        BP:           144/84 mmHg Patient Gender: F               HR:           95 bpm. Exam Location:  [facility] Procedure: 2D Echo, Cardiac Doppler and Color Doppler Indications:     Elevated troponin  History:         Patient has prior history of Echocardiogram examinations, most                  recent 11/07/2019. COPD, Arrythmias:Atrial Fibrillation,                  Signs/Symptoms:Murmur; Risk Factors:Hypertension.  Sonographer:     Sherrie Sport Referring Phys:  [place] Diagnosing Phys: Donnelly Angelica IMPRESSIONS  1. Left ventricular ejection fraction, by estimation, is 60 to 65%. The left ventricle has normal function. The left ventricle has no regional wall motion abnormalities. Left ventricular diastolic parameters are consistent with Grade I diastolic dysfunction (impaired relaxation).  2. Right ventricular systolic function is normal. The right ventricular size is mildly enlarged.  3. Right atrial size was mildly dilated.  4. The mitral valve is normal in structure. Moderate mitral valve regurgitation. No evidence of mitral stenosis.  5. Tricuspid valve regurgitation is moderate.  6. The aortic valve is calcified. Aortic valve regurgitation is not  visualized. Mild aortic valve stenosis.  7. The inferior vena cava is normal in size with greater than 50% respiratory variability, suggesting right atrial pressure of 3 mmHg. Conclusion(s)/Recommendation(s): CALCIFIED AORTIC VALVE WITH MILD AS. VMax 2.1 m/s. Dimensionless index 0.4. FINDINGS  Left Ventricle: Left ventricular ejection fraction, by estimation, is 60 to 65%. The left ventricle has normal function. The left ventricle has no regional wall motion abnormalities. The left ventricular internal cavity size was normal in size. There is  no left ventricular hypertrophy. Left ventricular diastolic parameters are consistent with Grade I diastolic dysfunction (impaired relaxation). Right Ventricle: The right ventricular size is mildly enlarged. No increase in right ventricular wall thickness. Right ventricular systolic function is normal. Left Atrium: Left atrial size was normal in size. Right Atrium: Right atrial size was mildly dilated. Pericardium: There is no evidence of pericardial effusion. Mitral Valve: The mitral valve is normal in structure. Moderate mitral valve regurgitation. No evidence of mitral valve stenosis. MV peak gradient, 3.1 mmHg. The mean mitral valve gradient is 1.0 mmHg. Tricuspid Valve: The tricuspid valve is normal in structure. Tricuspid valve regurgitation is moderate . No evidence of tricuspid stenosis. Aortic Valve: The aortic valve is calcified. Aortic valve regurgitation is not visualized. Mild aortic stenosis is present. Aortic valve mean gradient measures 9.4 mmHg. Aortic valve peak gradient measures 16.8 mmHg. Aortic valve area, by VTI measures 1.21 cm. Pulmonic Valve: The pulmonic valve was not well visualized. Pulmonic valve regurgitation is not visualized. No evidence of pulmonic stenosis. Aorta: The aortic root is normal in size and structure. Venous: The inferior vena cava is normal in size with greater than 50% respiratory variability, suggesting right atrial pressure of  3 mmHg. IAS/Shunts: No atrial level shunt detected by color flow Doppler.  LEFT VENTRICLE PLAX 2D LVIDd:         4.80 cm   Diastology LVIDs:         3.00 cm   LV e' medial:    5.87  cm/s LV PW:         1.10 cm   LV E/e' medial:  13.9 LV IVS:        0.75 cm   LV e' lateral:   12.40 cm/s LVOT diam:     2.00 cm   LV E/e' lateral: 6.6 LV SV:         45 LV SV Index:   28 LVOT Area:     3.14 cm  RIGHT VENTRICLE RV Basal diam:  3.80 cm RV S prime:     12.20 cm/s TAPSE (M-mode): 1.8 cm LEFT ATRIUM             Index        RIGHT ATRIUM           Index LA diam:        3.20 cm 1.96 cm/m   RA Area:     19.30 cm LA Vol (A2C):   58.8 ml 36.03 ml/m  RA Volume:   63.90 ml  39.15 ml/m LA Vol (A4C):   38.7 ml 23.71 ml/m LA Biplane Vol: 49.1 ml 30.08 ml/m  AORTIC VALVE                     PULMONIC VALVE AV Area (Vmax):    1.04 cm      PV Vmax:        0.59 m/s AV Area (Vmean):   0.91 cm      PV Vmean:       35.200 cm/s AV Area (VTI):     1.21 cm      PV VTI:         0.109 m AV Vmax:           205.20 cm/s   PV Peak grad:   1.4 mmHg AV Vmean:          140.200 cm/s  PV Mean grad:   1.0 mmHg AV VTI:            0.373 m       RVOT Peak grad: 4 mmHg AV Peak Grad:      16.8 mmHg AV Mean Grad:      9.4 mmHg LVOT Vmax:         67.90 cm/s LVOT Vmean:        40.500 cm/s LVOT VTI:          0.144 m LVOT/AV VTI ratio: 0.39  AORTA Ao Root diam: 3.17 cm MITRAL VALVE               TRICUSPID VALVE MV Area (PHT): 4.86 cm    TR Peak grad:   52.4 mmHg MV Area VTI:   3.01 cm    TR Vmax:        362.00 cm/s MV Peak grad:  3.1 mmHg MV Mean grad:  1.0 mmHg    SHUNTS MV Vmax:       0.88 m/s    Systemic VTI:  0.14 m MV Vmean:      55.0 cm/s   Systemic Diam: 2.00 cm MV Decel Time: 156 msec    Pulmonic VTI:  0.157 m MV E velocity: 81.40 cm/s MV A velocity: 51.40 cm/s MV E/A ratio:  1.58 Donnelly Angelica Electronically signed by Donnelly Angelica Signature Date/Time: 10/27/2021/4:07:10 PM    Final         Scheduled Meds:  apixaban  2.5 mg Oral BID   citalopram   40 mg Oral [id number]  feeding supplement  237 mL Oral TID BM   fluticasone furoate-vilanterol  1 puff Inhalation Daily   And   umeclidinium bromide  1 puff Inhalation Daily   gabapentin  300 mg Oral BID   multivitamin with minerals  1 tablet Oral Daily   pantoprazole (PROTONIX) IV  40 mg Intravenous QHS   Continuous Infusions:  dextrose 5 % and 0.45 % NaCl with KCl 20 mEq/L 75 mL/hr at 10/29/21 0409     LOS: 6 days    Time spent: 26 minutes    Sharen Hones, MD [facility]   To contact the attending provider between 7A-7P or the covering provider during after hours 7P-7A, please log into the web site [email or web address] and access using universal [place] password for that web site. If you do not have the password, please call the hospital operator.  10/29/2021, 12:21 PM

## 2021-10-29 NOTE — Progress Notes (Signed)
Patient ID: Danielle [name], female   DOB: 1956-05-18, 66 y.o.   MRN: [id number]     [place] Hospital Day(s): 6.   Interval History: Patient seen and examined, no acute events or new complaints overnight. Patient reports feeling sore in the abdomen. She thinks that is because she needs to pass gas. Not passing gas yet.   Vital signs in last 24 hours: [min-max] current  Temp:  [97.5 F (36.4 C)-99.1 F (37.3 C)] 99.1 F (37.3 C) (01/15 0822) Pulse Rate:  [87-98] 87 (01/15 0822) Resp:  [16-20] 18 (01/15 0822) BP: (119-157)/(79-95) 119/82 (01/15 0822) SpO2:  [89 %-95 %] 95 % (01/15 0822)     Height: 5\' 6"  (167.6 cm) Weight: 56.2 kg BMI (Calculated): 20.02   Physical Exam:  Constitutional: alert, cooperative and no distress  Respiratory: breathing non-labored at rest  Cardiovascular: regular rate and sinus rhythm  Gastrointestinal: soft, non-tender, but mild distended  Labs:  CBC Latest Ref Rng & Units 10/29/2021 10/28/2021 10/27/2021  WBC 4.0 - 10.5 K/uL 9.2 10.1 -  Hemoglobin 12.0 - 15.0 g/dL 7.8(L) 8.5(L) 6.8(L)  Hematocrit 36.0 - 46.0 % 23.8(L) 25.4(L) 21.5(L)  Platelets 150 - 400 K/uL 341 372 -   CMP Latest Ref Rng & Units 10/29/2021 10/28/2021 10/27/2021  Glucose 70 - 99 mg/dL 130(H) 94 103(H)  BUN 8 - 23 mg/dL 16 21 29(H)  Creatinine 0.44 - 1.00 mg/dL 1.31(H) 1.42(H) 2.11(H)  Sodium 135 - 145 mmol/L 138 135 137  Potassium 3.5 - 5.1 mmol/L 3.7 3.5 4.0  Chloride 98 - 111 mmol/L 97(L) 95(L) 106  CO2 22 - 32 mmol/L 32 26 21(L)  Calcium 8.9 - 10.3 mg/dL 8.1(L) 8.3(L) 8.4(L)  Total Protein 6.5 - 8.1 g/dL - - -  Total Bilirubin 0.3 - 1.2 mg/dL - - -  Alkaline Phos 38 - 126 U/L - - -  AST 15 - 41 U/L - - -  ALT 0 - 44 U/L - - -    Imaging studies: No new pertinent imaging studies   Assessment/Plan:  66 y.o. female with history of an ileostomy and mucous fistula 6 Day Post-Op s/p ileostomy reversal, complicated by pertinent comorbidities including COPD, atrial  fibrillation on anticoagulation, hypertension, chronic kidney disease stage III.   Status post ileostomy reversal -Patient with postoperative ileus.  NGT placed yesterday. Will continue with medical management. I will add Reglan.  -Stable hemoglobin   Chronic kidney disease stage III -Acute kidney injury after surgery.  This seem to be resolved.  -Appreciate hospitalist and nephrology evaluation and recommendations.  Encourage patient to get out of bed.  Will continue pain management.   Arnold Long, MD

## 2021-10-30 DIAGNOSIS — J9601 Acute respiratory failure with hypoxia: Secondary | ICD-10-CM

## 2021-10-30 DIAGNOSIS — I5033 Acute on chronic diastolic (congestive) heart failure: Secondary | ICD-10-CM

## 2021-10-30 LAB — BASIC METABOLIC PANEL
Anion gap: 8 (ref 5–15)
BUN: 15 mg/dL (ref 8–23)
CO2: 31 mmol/L (ref 22–32)
Calcium: 8.1 mg/dL — ABNORMAL LOW (ref 8.9–10.3)
Chloride: 98 mmol/L (ref 98–111)
Creatinine, Ser: 1.25 mg/dL — ABNORMAL HIGH (ref 0.44–1.00)
GFR, Estimated: 47 mL/min — ABNORMAL LOW (ref >60)
Glucose, Bld: 122 mg/dL — ABNORMAL HIGH (ref 70–99)
Potassium: 3.9 mmol/L (ref 3.5–5.1)
Sodium: 137 mmol/L (ref 135–145)

## 2021-10-30 LAB — CBC
HCT: 25.2 % — ABNORMAL LOW (ref 36.0–46.0)
Hemoglobin: 8 g/dL — ABNORMAL LOW (ref 12.0–15.0)
MCH: 30.3 pg (ref 26.0–34.0)
MCHC: 31.7 g/dL (ref 30.0–36.0)
MCV: 95.5 fL (ref 80.0–100.0)
Platelets: 358 10*3/uL (ref 150–400)
RBC: 2.64 MIL/uL — ABNORMAL LOW (ref 3.87–5.11)
RDW: 15.7 % — ABNORMAL HIGH (ref 11.5–15.5)
WBC: 8.4 10*3/uL (ref 4.0–10.5)
nRBC: 1.4 % — ABNORMAL HIGH (ref 0.0–0.2)

## 2021-10-30 LAB — PHOSPHORUS: Phosphorus: 2.1 mg/dL — ABNORMAL LOW (ref 2.5–4.6)

## 2021-10-30 LAB — MAGNESIUM: Magnesium: 1.3 mg/dL — ABNORMAL LOW (ref 1.7–2.4)

## 2021-10-30 MED ORDER — POTASSIUM & SODIUM PHOSPHATES 280-160-250 MG PO PACK
2.0000 | PACK | Freq: Three times a day (TID) | ORAL | Status: AC
  Administered 2021-10-30 (×4): 2 via ORAL
  Filled 2021-10-30 (×4): qty 2

## 2021-10-30 MED ORDER — DIGOXIN 0.25 MG/ML IJ SOLN
0.2500 mg | Freq: Once | INTRAMUSCULAR | Status: AC
  Administered 2021-10-30: 0.25 mg via INTRAVENOUS
  Filled 2021-10-30: qty 2

## 2021-10-30 MED ORDER — MAGNESIUM SULFATE 4 GM/100ML IV SOLN
4.0000 g | Freq: Once | INTRAVENOUS | Status: AC
  Administered 2021-10-30: 4 g via INTRAVENOUS
  Filled 2021-10-30: qty 100

## 2021-10-30 MED ORDER — DILTIAZEM HCL ER COATED BEADS 120 MG PO CP24
240.0000 mg | ORAL_CAPSULE | Freq: Every day | ORAL | Status: DC
  Administered 2021-10-30 – 2021-11-19 (×20): 240 mg via ORAL
  Filled 2021-10-30 (×23): qty 2

## 2021-10-30 MED ORDER — FUROSEMIDE 10 MG/ML IJ SOLN
40.0000 mg | Freq: Once | INTRAMUSCULAR | Status: AC
  Administered 2021-10-30: 40 mg via INTRAVENOUS
  Filled 2021-10-30: qty 4

## 2021-10-30 NOTE — Progress Notes (Signed)
Patient ID: Danielle [name], female   DOB: 25-Jul-1956, 66 y.o.   MRN: [id number]     [place] Hospital Day(s): 7.   Interval History: Patient seen and examined, no acute events or new complaints overnight. Patient reports feeling soreness on the abdomen.  She feels that the soreness is because she is not passing gas.  Vital signs in last 24 hours: [min-max] current  Temp:  [98.2 F (36.8 C)-98.7 F (37.1 C)] 98.2 F (36.8 C) (01/16 0832) Pulse Rate:  [53-93] 93 (01/16 0832) Resp:  [16-19] 19 (01/16 0832) BP: (117-135)/(78-95) 135/83 (01/16 0832) SpO2:  [97 %-100 %] 99 % (01/16 0832)     Height: 5\' 6"  (167.6 cm) Weight: 56.2 kg BMI (Calculated): 20.02   Physical Exam:  Constitutional: alert, cooperative and no distress  Respiratory: breathing non-labored at rest  Cardiovascular: regular rate and sinus rhythm  Gastrointestinal: soft, non-tender, and non-distended  Labs:  CBC Latest Ref Rng & Units 10/30/2021 10/29/2021 10/28/2021  WBC 4.0 - 10.5 K/uL 8.4 9.2 10.1  Hemoglobin 12.0 - 15.0 g/dL 8.0(L) 7.8(L) 8.5(L)  Hematocrit 36.0 - 46.0 % 25.2(L) 23.8(L) 25.4(L)  Platelets 150 - 400 K/uL 358 341 372   CMP Latest Ref Rng & Units 10/30/2021 10/29/2021 10/28/2021  Glucose 70 - 99 mg/dL 122(H) 130(H) 94  BUN 8 - 23 mg/dL 15 16 21   Creatinine 0.44 - 1.00 mg/dL 1.25(H) 1.31(H) 1.42(H)  Sodium 135 - 145 mmol/L 137 138 135  Potassium 3.5 - 5.1 mmol/L 3.9 3.7 3.5  Chloride 98 - 111 mmol/L 98 97(L) 95(L)  CO2 22 - 32 mmol/L 31 32 26  Calcium 8.9 - 10.3 mg/dL 8.1(L) 8.1(L) 8.3(L)  Total Protein 6.5 - 8.1 g/dL - - -  Total Bilirubin 0.3 - 1.2 mg/dL - - -  Alkaline Phos 38 - 126 U/L - - -  AST 15 - 41 U/L - - -  ALT 0 - 44 U/L - - -    Imaging studies: No new pertinent imaging studies   Assessment/Plan:  66 y.o. female with history of an ileostomy and mucous fistula 7 Day Post-Op s/p ileostomy reversal, complicated by pertinent comorbidities including COPD, atrial  fibrillation on anticoagulation, hypertension, chronic kidney disease stage III.   Status post ileostomy reversal -Patient with postoperative ileus.  NGT in place. Will continue with medical management.  -Reglan 5 mg 3 times per day -Stable hemoglobin   Chronic kidney disease stage III -Acute kidney injury after surgery.  This seem to be resolved.  -Appreciate hospitalist and nephrology evaluation and recommendations.   Encourage patient to get out of bed.  Will continue pain management.   Arnold Long, MD

## 2021-10-30 NOTE — Plan of Care (Signed)
°  Problem: Education: Goal: Knowledge of General Education information will improve Description: Including pain rating scale, medication(s)/side effects and non-pharmacologic comfort measures Outcome: Progressing   Problem: Clinical Measurements: Goal: Respiratory complications will improve Outcome: Progressing Goal: Cardiovascular complication will be avoided Outcome: Progressing   Problem: Clinical Measurements: Goal: Cardiovascular complication will be avoided Outcome: Progressing

## 2021-10-30 NOTE — Progress Notes (Addendum)
Physical Therapy Treatment Patient Details Name: Danielle [name] MRN: [id number] DOB: 10-Nov-1955 Today's Date: 10/31/2021   History of Present Illness 66 y/o female s/p iliostomy take down 1/09.    PT Comments    Pt with marked decline in function during today's therapy session. Pt requires minA to complete supine > EOB following attempt modI with HOB elevated and heavy use of handrails. Following transfer HR 107bpm and O2 87%, with quick recovery to O2% 94 and HR to 120 after a couiple mins of cued PLB. Patient completes sit to stand modI w/ RW very slowly with cuing needed for sequencing to stand, and is able to stand for a couple mins before needing to sit (PT attempted to encourage walking, pt unable). After subsequent modI stand patient is able to complete 8 marches in place before sitting with decreased control, minA needed to control lower. Following this patient reports she is too tired and feels dizzy and cant attempt ambulation in room. HR and O2 WNL with OOB, except following standing marching where HR is 157bpm O2 89% with quick return to normal limits. Patient able to complete sit to supine transfer modI with dizziness subsiding after this. Throughout session pt reports abdomen pain. D/t patients inability to demonstrate any safe ambulation this session, PT recommends SNF placement with current state. Subject to change rec should patient demonstrate further progress.    Recommendations for follow up therapy are one component of a multi-disciplinary discharge planning process, led by the attending physician.  Recommendations may be updated based on patient status, additional functional criteria and insurance authorization.  Follow Up Recommendations  Skilled nursing-short term rehab (<3 hours/day)     Assistance Recommended at Discharge Frequent or constant Supervision/Assistance  Patient can return home with the following A lot of help with walking and/or transfers;A lot of help with  bathing/dressing/bathroom   Equipment Recommendations  Rolling walker (2 wheels)    Recommendations for Other Services       Precautions / Restrictions Restrictions Weight Bearing Restrictions: No     Mobility  Bed Mobility Overal bed mobility: Needs Assistance Bed Mobility: Supine to Sit;Sit to Supine     Supine to sit: Min assist Sit to supine: Modified independent (Device/Increase time)   General bed mobility comments: Pt requires minA to complete supine > sit EOB after attempt with handrails only    Transfers Overall transfer level: Needs assistance Equipment used: Rolling walker (2 wheels) Transfers: Sit to/from Stand Sit to Stand: Min guard           General transfer comment: completes transfer without physical assist very slowly, cuing needed for sequencing safety    Ambulation/Gait                   Stairs             Wheelchair Mobility    Modified Rankin (Stroke Patients Only)       Balance             Standing balance-Leahy Scale: Poor Standing balance comment: reliant on the walker in both dynamic and static standing.  No LOBs but far from her baseline w/o AD                            Cognition  Exercises Other Exercises Other Exercises: supine > sit cuing for increased ind with use of handrails and HOB elevated, still need for minA Other Exercises: STS cuing for set up and RW safety modI x3 atteps Other Exercises: marching in place with CGA for safety, patient with quick fatigue requiring minA to return to sit without LOB    General Comments        Pertinent Vitals/Pain Faces Pain Scale: Hurts even more Pain Location: abdomen Pain Descriptors / Indicators: Aching    Home Living                          Prior Function            PT Goals (current goals can now be found in the care plan section) Acute Rehab PT  Goals Patient Stated Goal: go home PT Goal Formulation: With patient Time For Goal Achievement: 11/07/21 Potential to Achieve Goals: Fair    Frequency    Min 2X/week      PT Plan Current plan remains appropriate    Co-evaluation              AM-PAC PT "6 Clicks" Mobility   Outcome Measure  Help needed turning from your back to your side while in a flat bed without using bedrails?: A Little Help needed moving from lying on your back to sitting on the side of a flat bed without using bedrails?: A Little Help needed moving to and from a bed to a chair (including a wheelchair)?: A Little Help needed standing up from a chair using your arms (e.g., wheelchair or bedside chair)?: A Little Help needed to walk in hospital room?: A Lot Help needed climbing 3-5 steps with a railing? : Total 6 Click Score: 15    End of Session Equipment Utilized During Treatment: Gait belt;Oxygen Activity Tolerance: Patient limited by pain;Treatment limited secondary to medical complications (Comment) Patient left: with chair alarm set;in bed;with call bell/phone within reach;with bed alarm set;with nursing/sitter in room Nurse Communication: Mobility status PT Visit Diagnosis: Muscle weakness (generalized) (M62.81);Difficulty in walking, not elsewhere classified (R26.2)     Time: 1610 - 9604    Charges:   1 Visit Theract (23-27 minutes)                    Durwin Reges DPT    *Addendum by Ellison Hughs completed to pull all sections into note from previous date (some were erroneously left out in note refresh on previous date)  [place]. Owens Shark, PT, DPT, NCS 10/31/21, 11:11 AM [phone]

## 2021-10-30 NOTE — Progress Notes (Addendum)
Nutrition Follow Up Note   DOCUMENTATION CODES:   Non-severe (moderate) malnutrition in context of chronic illness  INTERVENTION:   Recommend TPN if unable to advance pt's diet in the next 24 hours.   Pt at high refeed risk; recommend monitor potassium, magnesium and phosphorus labs daily until stable  Daily weights   NUTRITION DIAGNOSIS:   Moderate Malnutrition related to cancer and cancer related treatments as evidenced by mild fat depletion, mild muscle depletion, severe muscle depletion.  GOAL:   Patient will meet greater than or equal to 90% of their needs -not met   MONITOR:   Diet advancement, Labs, Weight trends, Skin, I & O's  ASSESSMENT:   66 y.o. 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 Pt was eating 50% of meals on the soft diet but then developed nausea, vomiting and abdominal distension on 1/13 and was made NPO. CT scan from 1/13 reports post op ileus with no anastomosis leak. NGT placed 1/14 (noted to be with side port in the distal esophagus; MD reports tube has been advanced) to LIS and has had 43m output over the past 24 hrs. Pt has now been without adequate nutrition for > 7 days. Spoke with MD, recommend TPN if unable to advance pt's diet in the next 24 hrs. Pt currently refeeding secondary to IV dextrose and continues to be at high refeed risk. Pt also at high risk for volume overload. No new weight  since admit; will request daily weights. Pt +3.6L on her I & Os.   Medications reviewed and include: celexa, reglan, MVI, protonix, NaCl w/ KCl and 5% dextrose _0 /hr    Labs reviewed: K 3.9 wnl, creat 1.25(H), P 2.1(L), Mg 1.3(L) Hgb 8.0(L), Hct 25.2(L)  Diet Order:    Diet Order             Diet NPO time specified Except for: Sips with Meds  Diet effective now                  EDUCATION NEEDS:   Education needs have been addressed  Skin:  Skin Assessment: Reviewed RN Assessment (Incision abdomen)  Last BM:  1/13- type 7  Height:   Ht Readings from Last 1 Encounters:  10/23/21 _1  (1.676 m)    Weight:   Wt Readings from Last 1 Encounters:  10/23/21 56.2 kg    Ideal Body Weight:  59 kg  BMI:  Body mass index is 20.01 kg/m.  Estimated Nutritional Needs:   Kcal:  1700-1900kcal/day  Protein:  85-95g/day  Fluid:  1.2-1.5L/day  CKoleen DistanceMS, RD, LDN Please refer to [facility] and/or RD on-call/weekend/after hours pager

## 2021-10-30 NOTE — Progress Notes (Signed)
PROGRESS NOTE    Danielle [name]  BTD:[id number] DOB: [birth year] DOA: 10/23/2021 PCP: [facility] [place]    Brief Narrative:  Danielle [name] is an 66 y.o. female with a past medical history of anemia, anxiety, aortic atherosclerosis, aortic valve stenosis, chronic atrial fibrillation, stage IV CKD, breast cancer with history of chemotherapy, history of radiotherapy, COPD, depression, diastolic dysfunction, GERD, hyperlipidemia, hypertension, lymphedema, small bowel obstruction, vitamin D deficiency who presented to the hospital for an colostomy reversal.  Hospitalist consulted for management of medical problems.   Assessment & Plan:   Principal Problem:   Ileostomy status ([place]) Active Problems:   Hyponatremia   Acute kidney injury superimposed on CKD (HCC)   HTN (hypertension)   AF (paroxysmal atrial fibrillation) (HCC)   Normocytic anemia   Depression   Hyperphosphatemia  Acute hypoxemic respiratory failure. Acute on chronic diastolic congestive heart failure. Paroxysmal atrial fibrillation with rapid ventricle response. COPD. Patient was started on oral torsemide yesterday.  She did not make much urine.  She continues to have some short of breath and hypoxemia.  It is deemed that that she has acute on chronic diastolic congestive heart failure.  Reviewed echocardiogram, ejection fraction 60 to 65%. I will change torsemide to IV Lasix.  We will give a dose IV today, reassess tomorrow. Patient also developed atrial fibrillation with rapid ventricle response last night, he received IV metoprolol. I started her on oral diltiazem, also give a dose of digoxin.  Heart rate much better now. Continue Eliquis.   Acute on chronic anemia. Acute blood loss anemia. Adequate B12 and iron level.  Acute kidney injury Hyponatremia. Acute kidney injury on chronic kidney disease stage 4. Metabolic acidosis. Hypomagnesemia Function has improved.  Patient still requiring fluids as  patient has not been able to take any p.o. due to ileus.  Continue fluids.  Also receiving IV Lasix. Magnesium 1.3 today, will give 4 g of IV magnesium.   Esophageal cancer. Status post curative radiation.   Postop ileus. Followed by general surgery.       DVT prophylaxis: Eliquis Code Status: full Family Communication:  Disposition Plan:       I/O last 3 completed shifts: In: 2475 [I.V.:2475] Out: 1062 [Urine:550; Emesis/NG output:625] No intake/output data recorded.    Subjective: Patient developed atrial fibrillation with RVR last night, received a 5 mg IV metoprolol.  Heart rate still fast this morning, but patient does not have any palpitation. She has some short of breath with exertion, she is still on 2 L oxygen. She has not made a lot of urine. She denies any abdominal pain nausea vomiting. No dysuria hematuria  No fever or chills.  Objective: Vitals:   10/29/21 1532 10/29/21 2028 10/30/21 0512 10/30/21 0832  BP: (!) 130/95 122/78 117/85 135/83  Pulse: 87 89 (!) 53 93  Resp: 18 16 16 19   Temp: 98.6 F (37 C) 98.7 F (37.1 C) 98.6 F (37 C) 98.2 F (36.8 C)  TempSrc:  Oral Oral Oral  SpO2: 100% 97% 99% 99%  Weight:      Height:        Intake/Output Summary (Last 24 hours) at 10/30/2021 1232 Last data filed at 10/30/2021 0554 Gross per 24 hour  Intake 1639.46 ml  Output 700 ml  Net 939.46 ml   Filed Weights   10/23/21 0609  Weight: 56.2 kg    Examination:  General exam: Appears calm and comfortable  Respiratory system: Crackles in the base. Respiratory effort normal.  Cardiovascular system: Irregularly irregular. No JVD, murmurs, rubs, gallops or clicks. No pedal edema. Gastrointestinal system: Abdomen is nondistended, soft and nontender. No organomegaly or masses felt. Normal bowel sounds heard. Central nervous system: Alert and oriented. No focal neurological deficits. Extremities: Symmetric 5 x 5 power. Skin: No rashes, lesions or  ulcers Psychiatry: Judgement and insight appear normal. Mood & affect appropriate.     Data Reviewed: I have personally reviewed following labs and imaging studies  CBC: Recent Labs  Lab 10/25/21 0436 10/26/21 0429 10/26/21 1657 10/27/21 0442 10/28/21 0614 10/29/21 0553 10/30/21 0610  WBC 10.2  --  8.7  --  10.1 9.2 8.4  NEUTROABS  --   --  6.8  --   --   --   --   HGB 7.3*   < > 8.0* 6.8* 8.5* 7.8* 8.0*  HCT 24.1*   < > 25.7* 21.5* 25.4* 23.8* 25.2*  MCV 99.2  --  97.7  --  93.0 94.4 95.5  PLT 390  --  411*  --  372 341 358   < > = values in this interval not displayed.   Basic Metabolic Panel: Recent Labs  Lab 10/24/21 0431 10/25/21 0436 10/26/21 0429 10/26/21 1657 10/27/21 0442 10/28/21 0614 10/29/21 0553 10/30/21 0610  NA 129*   < > 131* 134* 137 135 138 137  K 4.8   < > 4.6 5.0 4.0 3.5 3.7 3.9  CL 108   < > 110 109 106 95* 97* 98  CO2 15*   < > 16* 18* 21* 26 32 31  GLUCOSE 155*   < > 92 109* 103* 94 130* 122*  BUN 32*   < > 33* 35* 29* 21 16 15   CREATININE 2.58*   < > 3.00* 2.79* 2.11* 1.42* 1.31* 1.25*  CALCIUM 8.1*   < > 7.8* 8.3* 8.4* 8.3* 8.1* 8.1*  MG 1.9  --   --   --   --   --   --  1.3*  PHOS 4.7*  --  3.2  --  3.2  --   --  2.1*   < > = values in this interval not displayed.   GFR: Estimated Creatinine Clearance: 38.7 mL/min (A) (by C-G formula based on SCr of 1.25 mg/dL (H)). Liver Function Tests: Recent Labs  Lab 10/25/21 0436 10/26/21 0429 10/27/21 0442  AST 19  --   --   ALT 9  --   --   ALKPHOS 47  --   --   BILITOT 0.2*  --   --   PROT 6.5  --   --   ALBUMIN 2.6* 2.5* 3.0*   No results for input(s): LIPASE, AMYLASE in the last 168 hours. No results for input(s): AMMONIA in the last 168 hours. Coagulation Profile: No results for input(s): INR, PROTIME in the last 168 hours. Cardiac Enzymes: No results for input(s): CKTOTAL, CKMB, CKMBINDEX, TROPONINI in the last 168 hours. BNP (last 3 results) No results for input(s): PROBNP in  the last 8760 hours. HbA1C: No results for input(s): HGBA1C in the last 72 hours. CBG: Recent Labs  Lab 10/23/21 1243 10/26/21 0708 10/26/21 1626  GLUCAP 143* 89 104*   Lipid Profile: No results for input(s): CHOL, HDL, LDLCALC, TRIG, CHOLHDL, LDLDIRECT in the last 72 hours. Thyroid Function Tests: No results for input(s): TSH, T4TOTAL, FREET4, T3FREE, THYROIDAB in the last 72 hours. Anemia Panel: Recent Labs    10/29/21 0710 10/29/21 0848  [id number]  --  736  FERRITIN 363*  --   TIBC 147*  --   IRON 39  --    Sepsis Labs: No results for input(s): PROCALCITON, LATICACIDVEN in the last 168 hours.  No results found for this or any previous visit (from the past 240 hour(s)).       Radiology Studies: DG Abd 1 View  Result Date: 10/28/2021 CLINICAL DATA:  NG tube placement EXAM: ABDOMEN - 1 VIEW COMPARISON:  10/26/2021 FINDINGS: Limited radiograph of the lower chest and upper abdomen was obtained for the purposes of enteric tube localization. Enteric tube is seen coursing below the diaphragm with distal tipterminating within the proximal stomach. Side port terminates within the distal esophagus. Similar degree of gaseous distension of large and small bowel within the visualized abdomen. IMPRESSION: Enteric tube with distal tip terminating within the proximal stomach, side port terminating within the distal esophagus. Recommend advancement 7-10 cm. Electronically Signed   By: Davina Poke D.O.   On: 10/28/2021 15:48        Scheduled Meds:  apixaban  2.5 mg Oral BID   citalopram  40 mg Oral BH-q7a   diltiazem  240 mg Oral Daily   feeding supplement  237 mL Oral TID BM   fluticasone furoate-vilanterol  1 puff Inhalation Daily   And   umeclidinium bromide  1 puff Inhalation Daily   gabapentin  300 mg Oral BID   metoCLOPramide (REGLAN) injection  5 mg Intravenous Q8H   multivitamin with minerals  1 tablet Oral Daily   pantoprazole (PROTONIX) IV  40 mg Intravenous QHS    potassium & sodium phosphates  2 packet Oral TID AC & HS   Continuous Infusions:  dextrose 5 % and 0.45 % NaCl with KCl 20 mEq/L 75 mL/hr at 10/30/21 0554     LOS: 7 days    Time spent: 32 minutes    Sharen Hones, MD [facility]   To contact the attending provider between 7A-7P or the covering provider during after hours 7P-7A, please log into the web site [email or web address] and access using universal [place] password for that web site. If you do not have the password, please call the hospital operator.  10/30/2021, 12:32 PM

## 2021-10-31 ENCOUNTER — Encounter: Payer: Self-pay | Admitting: Gastroenterology

## 2021-10-31 ENCOUNTER — Inpatient Hospital Stay: Payer: Medicare Other

## 2021-10-31 DIAGNOSIS — J9621 Acute and chronic respiratory failure with hypoxia: Secondary | ICD-10-CM

## 2021-10-31 LAB — PHOSPHORUS: Phosphorus: 2.6 mg/dL (ref 2.5–4.6)

## 2021-10-31 LAB — BASIC METABOLIC PANEL
Anion gap: 8 (ref 5–15)
BUN: 14 mg/dL (ref 8–23)
CO2: 34 mmol/L — ABNORMAL HIGH (ref 22–32)
Calcium: 8.3 mg/dL — ABNORMAL LOW (ref 8.9–10.3)
Chloride: 96 mmol/L — ABNORMAL LOW (ref 98–111)
Creatinine, Ser: 1.28 mg/dL — ABNORMAL HIGH (ref 0.44–1.00)
GFR, Estimated: 46 mL/min — ABNORMAL LOW (ref >60)
Glucose, Bld: 111 mg/dL — ABNORMAL HIGH (ref 70–99)
Potassium: 4.1 mmol/L (ref 3.5–5.1)
Sodium: 138 mmol/L (ref 135–145)

## 2021-10-31 LAB — MAGNESIUM: Magnesium: 2 mg/dL (ref 1.7–2.4)

## 2021-10-31 MED ORDER — BISACODYL 10 MG RE SUPP
10.0000 mg | Freq: Every day | RECTAL | Status: DC
  Administered 2021-10-31 – 2021-11-04 (×3): 10 mg via RECTAL
  Filled 2021-10-31 (×7): qty 1

## 2021-10-31 MED ORDER — FUROSEMIDE 10 MG/ML IJ SOLN
40.0000 mg | Freq: Once | INTRAMUSCULAR | Status: AC
  Administered 2021-10-31: 40 mg via INTRAVENOUS
  Filled 2021-10-31: qty 4

## 2021-10-31 NOTE — Care Management Important Message (Signed)
Important Message  Patient Details  Name: Danielle [name] MRN: [id number] Date of Birth: 02/19/1956   Medicare Important Message Given:  Yes     Dannette Deona 10/31/2021, 11:32 AM

## 2021-10-31 NOTE — Progress Notes (Signed)
Patient ID: Danielle [name], female   DOB: [birth year], 66 y.o.   MRN: [id number]     [place] [facility] Day(s): 8.   Interval History: Patient seen and examined, no acute events or new complaints overnight. Patient reports still passing gas.  Denies significant abdominal pain only soreness due to distention.  Vital signs in last 24 hours: [min-max] current  Temp:  [97.6 F (36.4 C)-99.8 F (37.7 C)] 98 F (36.7 C) (01/17 1548) Pulse Rate:  [81-87] 86 (01/17 1548) Resp:  [17-18] 18 (01/17 0417) BP: (129-149)/(78-89) 141/89 (01/17 1548) SpO2:  [91 %-96 %] 92 % (01/17 1548) Weight:  [66.5 kg] 66.5 kg (01/17 0500)     Height: 5\' 6"  (167.6 cm) Weight: 66.5 kg BMI (Calculated): 23.67   Physical Exam:  Constitutional: alert, cooperative and no distress  Respiratory: breathing non-labored at rest  Cardiovascular: regular rate and sinus rhythm  Gastrointestinal: soft, mild tender, and mild-distended  Labs:  CBC Latest Ref Rng & Units 10/30/2021 10/29/2021 10/28/2021  WBC 4.0 - 10.5 K/uL 8.4 9.2 10.1  Hemoglobin 12.0 - 15.0 g/dL 8.0(L) 7.8(L) 8.5(L)  Hematocrit 36.0 - 46.0 % 25.2(L) 23.8(L) 25.4(L)  Platelets 150 - 400 K/uL 358 341 372   CMP Latest Ref Rng & Units 10/31/2021 10/30/2021 10/29/2021  Glucose 70 - 99 mg/dL 111(H) 122(H) 130(H)  BUN 8 - 23 mg/dL 14 15 16   Creatinine 0.44 - 1.00 mg/dL 1.28(H) 1.25(H) 1.31(H)  Sodium 135 - 145 mmol/L 138 137 138  Potassium 3.5 - 5.1 mmol/L 4.1 3.9 3.7  Chloride 98 - 111 mmol/L 96(L) 98 97(L)  CO2 22 - 32 mmol/L 34(H) 31 32  Calcium 8.9 - 10.3 mg/dL 8.3(L) 8.1(L) 8.1(L)  Total Protein 6.5 - 8.1 g/dL - - -  Total Bilirubin 0.3 - 1.2 mg/dL - - -  Alkaline Phos 38 - 126 U/L - - -  AST 15 - 41 U/L - - -  ALT 0 - 44 U/L - - -    Imaging studies: Abdominal x-ray shows persistent small and large bowel dilation consistent with ileus pattern.   Assessment/Plan:  66 y.o. female with history of an ileostomy and mucous fistula 8 Day  Post-Op s/p ileostomy reversal, complicated by pertinent comorbidities including COPD, atrial fibrillation on anticoagulation, hypertension, chronic kidney disease stage III.   Status post ileostomy reversal -Patient with postoperative ileus.  NGT in place. Will continue with medical management.  -Reglan 5 mg 3 times per day -We will consider Gastrografin challenge tomorrow if GI function has not returned   Chronic kidney disease stage III -Acute kidney injury after surgery.  This seem to be resolved.  -Urine output off 1475 of urine in last 24 hours. -Appreciate hospitalist and nephrology evaluation and recommendations.   Encourage patient to get out of bed.  Will continue pain management.   Arnold Long, MD

## 2021-10-31 NOTE — Progress Notes (Addendum)
Mobility Specialist - Progress Note   10/31/21 1159  Mobility  Activity Ambulated with assistance in room;Transferred to/from [facility]  Level of Assistance Standby assist, set-up cues, supervision of patient - no hands on  Assistive Device BSC;Front wheel walker  Distance Ambulated (ft) 4 ft  Activity Response Tolerated well  $Mobility charge 1 Mobility    Pre-mobility: 93 HR, 87-92% SpO2 During mobility: 114 HR, 75-82% SpO2 Post-mobility: 116 HR, 92% SpO2   Pt lying in bed upon arrival, utilizing 2L. Pt c/o abdominal soreness, but agreeable to activity. Pt sat EOB modI and voiced 7/10 dizziness that was persistent throughout remainder of session. BP 136/73. Pt stood with supervision, c/o gout in R foot flaring up 10/10 pain. Only able to take 3 steps before needing to sit. O2 desat to 75-82%, pt states difficulty feeling airflow through cannula. O2 bumped to 4L with sats improving to 93%. Pt voiced need to void. Assisted to [facility] with SPT. Small BM with reddish tint when wiping. Pt returned to bed with alarm set, needs in reach. O2 desat to low 80s again on 4L with transfer. PLB. Returned to 2L with sats maintaining 92%. RN notified.   Kathee Delton Mobility Specialist 10/31/21, 12:32 PM

## 2021-10-31 NOTE — TOC Progression Note (Signed)
Transition of Care [facility]) - Progression Note    Patient Details  Name: Danielle [name] MRN: [id number] Date of Birth: [birth year]  [facility] CM/SW Contact  Beverly Sessions, RN Phone Number: 10/31/2021, 2:50 PM  Clinical Narrative:    PT recommending SNF.  Discussed recommendations with patient.  Patient politely declines rehab.   Earlier in the admission patient declined home health services.  I discussed this with her to see if it is something that she would reconsider.   She states not right not.  "After I get home if I change my mind ill let my doctor know"  Patient gave me permission to update her daughter, but request that it be tomorrow as her daughter is working today till 9pm  Patient request [facility] for discharge    Expected Discharge Plan: Home/Self Care Barriers to Discharge: Continued Medical Work up  Expected Discharge Plan and Services Expected Discharge Plan: Home/Self Care                                               Social Determinants of Health (SDOH) Interventions    Readmission Risk Interventions Readmission Risk Prevention Plan 10/16/2019 09/28/2019 06/20/2019  Transportation Screening Complete Complete Complete  Medication Review [employer]) Referral to Pharmacy Complete Complete  [place] or Home Care Consult Complete Patient refused Complete  SW Recovery Care/Counseling Consult Complete Complete -  Palliative Care Screening Not Applicable Not Applicable Not [place] Not Applicable Not Applicable -  Some recent data might be hidden

## 2021-10-31 NOTE — Progress Notes (Signed)
She is PROGRESS NOTE    Danielle [name]  UUV:[id number] DOB: [birth year] DOA: 10/23/2021 PCP: [facility] [place]    Brief Narrative:  Danielle [name] is an 66 y.o. female with a past medical history of anemia, anxiety, aortic atherosclerosis, aortic valve stenosis, chronic atrial fibrillation, stage IV CKD, breast cancer with history of chemotherapy, history of radiotherapy, COPD, depression, diastolic dysfunction, GERD, hyperlipidemia, hypertension, lymphedema, small bowel obstruction, vitamin D deficiency who presented to the hospital for an colostomy reversal.  Hospitalist consulted for management of medical problems.   Assessment & Plan:   Principal Problem:   Ileostomy status ([place]) Active Problems:   Hyponatremia   Acute kidney injury superimposed on CKD (HCC)   HTN (hypertension)   AF (paroxysmal atrial fibrillation) (HCC)   Normocytic anemia   Depression   Hyperphosphatemia  Acute hypoxemic respiratory failure. Acute on chronic diastolic congestive heart failure. Paroxysmal atrial fibrillation with rapid ventricle response. COPD. Patient short of breath much improved.  Still on 2 L oxygen.  She is currently n.p.o., she is also receiving IV fluids. She developed some volume overload, she has been given Lasix IV based on daily evaluation.  I will give another dose of IV Lasix today. Shortness of breath seem to be improving. Patient also developed atrial fibrillation with RVR, she is on increased dose of diltiazem.  It appears that she has converted to sinus.  Continue Eliquis.     Acute on chronic anemia. Acute blood loss anemia. Adequate B12 and iron level. Continue monitor hemoglobin, transfuse as needed.  Acute kidney injury Hyponatremia. Acute kidney injury on chronic kidney disease stage 4. Metabolic acidosis. Hypomagnesemia Renal function had improved.  Continue monitor.   Esophageal cancer. Status post curative radiation.   Postop  ileus. Followed by general surgery. Started Dulcolax per rectum.     DVT prophylaxis: Eliquis Code Status: full Family Communication:  Disposition Plan:      I/O last 3 completed shifts: In: 3547 [I.V.:3547] Out: 2325 [Urine:1625; Emesis/NG output:700] No intake/output data recorded.     Subjective: Patient still on 2 L oxygen, short of breath is better.  She is still n.p.o. due to postop ileus.  No nausea vomiting or abdominal pain. She was able to get off bed with physical therapy yesterday. No fever chills  No dysuria hematuria.  Objective: Vitals:   10/31/21 0010 10/31/21 0417 10/31/21 0500 10/31/21 0733  BP: 132/78 135/84  (!) 149/86  Pulse: 83 81  87  Resp: 17 18    Temp: 97.9 F (36.6 C) 98.9 F (37.2 C)  97.6 F (36.4 C)  TempSrc: Oral Oral    SpO2: 96% 95%  91%  Weight:   66.5 kg   Height:        Intake/Output Summary (Last 24 hours) at 10/31/2021 1017 Last data filed at 10/31/2021 0619 Gross per 24 hour  Intake 1907.52 ml  Output 2075 ml  Net -167.48 ml   Filed Weights   10/23/21 0609 10/31/21 0500  Weight: 56.2 kg 66.5 kg    Examination:  General exam: Appears calm and comfortable  Respiratory system: Significant decreased breathing sounds. Respiratory effort normal. Cardiovascular system: S1 & S2 heard, RRR. No JVD, murmurs, rubs, gallops or clicks. No pedal edema. Gastrointestinal system: Abdomen is nondistended, soft and nontender. No organomegaly or masses felt. Normal bowel sounds heard. Central nervous system: Alert and oriented. No focal neurological deficits. Extremities: Symmetric 5 x 5 power. Skin: No rashes, lesions or ulcers Psychiatry: Judgement and  insight appear normal. Mood & affect appropriate.     Data Reviewed: I have personally reviewed following labs and imaging studies  CBC: Recent Labs  Lab 10/25/21 0436 10/26/21 0429 10/26/21 1657 10/27/21 0442 10/28/21 0614 10/29/21 0553 10/30/21 0610  WBC 10.2  --  8.7   --  10.1 9.2 8.4  NEUTROABS  --   --  6.8  --   --   --   --   HGB 7.3*   < > 8.0* 6.8* 8.5* 7.8* 8.0*  HCT 24.1*   < > 25.7* 21.5* 25.4* 23.8* 25.2*  MCV 99.2  --  97.7  --  93.0 94.4 95.5  PLT 390  --  411*  --  372 341 358   < > = values in this interval not displayed.   Basic Metabolic Panel: Recent Labs  Lab 10/26/21 0429 10/26/21 1657 10/27/21 0442 10/28/21 0614 10/29/21 0553 10/30/21 0610 10/31/21 0438  NA 131*   < > 137 135 138 137 138  K 4.6   < > 4.0 3.5 3.7 3.9 4.1  CL 110   < > 106 95* 97* 98 96*  CO2 16*   < > 21* 26 32 31 34*  GLUCOSE 92   < > 103* 94 130* 122* 111*  BUN 33*   < > 29* 21 16 15 14   CREATININE 3.00*   < > 2.11* 1.42* 1.31* 1.25* 1.28*  CALCIUM 7.8*   < > 8.4* 8.3* 8.1* 8.1* 8.3*  MG  --   --   --   --   --  1.3* 2.0  PHOS 3.2  --  3.2  --   --  2.1* 2.6   < > = values in this interval not displayed.   GFR: Estimated Creatinine Clearance: 39.9 mL/min (A) (by C-G formula based on SCr of 1.28 mg/dL (H)). Liver Function Tests: Recent Labs  Lab 10/25/21 0436 10/26/21 0429 10/27/21 0442  AST 19  --   --   ALT 9  --   --   ALKPHOS 47  --   --   BILITOT 0.2*  --   --   PROT 6.5  --   --   ALBUMIN 2.6* 2.5* 3.0*   No results for input(s): LIPASE, AMYLASE in the last 168 hours. No results for input(s): AMMONIA in the last 168 hours. Coagulation Profile: No results for input(s): INR, PROTIME in the last 168 hours. Cardiac Enzymes: No results for input(s): CKTOTAL, CKMB, CKMBINDEX, TROPONINI in the last 168 hours. BNP (last 3 results) No results for input(s): PROBNP in the last 8760 hours. HbA1C: No results for input(s): HGBA1C in the last 72 hours. CBG: Recent Labs  Lab 10/26/21 0708 10/26/21 1626  GLUCAP 89 104*   Lipid Profile: No results for input(s): CHOL, HDL, LDLCALC, TRIG, CHOLHDL, LDLDIRECT in the last 72 hours. Thyroid Function Tests: No results for input(s): TSH, T4TOTAL, FREET4, T3FREE, THYROIDAB in the last 72  hours. Anemia Panel: Recent Labs    10/29/21 0710 10/29/21 0848  VITAMINB12  --  736  FERRITIN 363*  --   TIBC 147*  --   IRON 39  --    Sepsis Labs: No results for input(s): PROCALCITON, LATICACIDVEN in the last 168 hours.  No results found for this or any previous visit (from the past 240 hour(s)).       Radiology Studies: No results found.      Scheduled Meds:  apixaban  2.5 mg Oral BID  bisacodyl  10 mg Rectal Daily   citalopram  40 mg Oral BH-q7a   diltiazem  240 mg Oral Daily   fluticasone furoate-vilanterol  1 puff Inhalation Daily   And   umeclidinium bromide  1 puff Inhalation Daily   furosemide  40 mg Intravenous Once   gabapentin  300 mg Oral BID   metoCLOPramide (REGLAN) injection  5 mg Intravenous Q8H   multivitamin with minerals  1 tablet Oral Daily   pantoprazole (PROTONIX) IV  40 mg Intravenous QHS   Continuous Infusions:  dextrose 5 % and 0.45 % NaCl with KCl 20 mEq/L 75 mL/hr at 10/31/21 0602     LOS: 8 days    Time spent: 27 minutes    Sharen Hones, MD [facility]   To contact the attending provider between 7A-7P or the covering provider during after hours 7P-7A, please log into the web site [email or web address] and access using universal [place] password for that web site. If you do not have the password, please call the hospital operator.  10/31/2021, 10:17 AM

## 2021-11-01 ENCOUNTER — Inpatient Hospital Stay: Payer: Self-pay

## 2021-11-01 ENCOUNTER — Inpatient Hospital Stay: Payer: Medicare Other

## 2021-11-01 DIAGNOSIS — I4891 Unspecified atrial fibrillation: Secondary | ICD-10-CM

## 2021-11-01 DIAGNOSIS — E871 Hypo-osmolality and hyponatremia: Secondary | ICD-10-CM

## 2021-11-01 DIAGNOSIS — D638 Anemia in other chronic diseases classified elsewhere: Secondary | ICD-10-CM

## 2021-11-01 LAB — CBC WITH DIFFERENTIAL/PLATELET
Abs Immature Granulocytes: 0.09 10*3/uL — ABNORMAL HIGH (ref 0.00–0.07)
Basophils Absolute: 0 10*3/uL (ref 0.0–0.1)
Basophils Relative: 0 %
Eosinophils Absolute: 0.2 10*3/uL (ref 0.0–0.5)
Eosinophils Relative: 2 %
HCT: 23.8 % — ABNORMAL LOW (ref 36.0–46.0)
Hemoglobin: 7.6 g/dL — ABNORMAL LOW (ref 12.0–15.0)
Immature Granulocytes: 1 %
Lymphocytes Relative: 12 %
Lymphs Abs: 1.2 10*3/uL (ref 0.7–4.0)
MCH: 30.6 pg (ref 26.0–34.0)
MCHC: 31.9 g/dL (ref 30.0–36.0)
MCV: 96 fL (ref 80.0–100.0)
Monocytes Absolute: 0.9 10*3/uL (ref 0.1–1.0)
Monocytes Relative: 9 %
Neutro Abs: 7.1 10*3/uL (ref 1.7–7.7)
Neutrophils Relative %: 76 %
Platelets: 346 10*3/uL (ref 150–400)
RBC: 2.48 MIL/uL — ABNORMAL LOW (ref 3.87–5.11)
RDW: 16 % — ABNORMAL HIGH (ref 11.5–15.5)
WBC: 9.4 10*3/uL (ref 4.0–10.5)
nRBC: 0 % (ref 0.0–0.2)

## 2021-11-01 LAB — GLUCOSE, CAPILLARY
Glucose-Capillary: 108 mg/dL — ABNORMAL HIGH (ref 70–99)
Glucose-Capillary: 123 mg/dL — ABNORMAL HIGH (ref 70–99)
Glucose-Capillary: 127 mg/dL — ABNORMAL HIGH (ref 70–99)

## 2021-11-01 LAB — BASIC METABOLIC PANEL
Anion gap: 7 (ref 5–15)
BUN: 14 mg/dL (ref 8–23)
CO2: 34 mmol/L — ABNORMAL HIGH (ref 22–32)
Calcium: 8.4 mg/dL — ABNORMAL LOW (ref 8.9–10.3)
Chloride: 94 mmol/L — ABNORMAL LOW (ref 98–111)
Creatinine, Ser: 1.23 mg/dL — ABNORMAL HIGH (ref 0.44–1.00)
GFR, Estimated: 48 mL/min — ABNORMAL LOW (ref >60)
Glucose, Bld: 106 mg/dL — ABNORMAL HIGH (ref 70–99)
Potassium: 4 mmol/L (ref 3.5–5.1)
Sodium: 135 mmol/L (ref 135–145)

## 2021-11-01 LAB — MAGNESIUM: Magnesium: 1.5 mg/dL — ABNORMAL LOW (ref 1.7–2.4)

## 2021-11-01 LAB — PHOSPHORUS: Phosphorus: 2.9 mg/dL (ref 2.5–4.6)

## 2021-11-01 LAB — HEMOGLOBIN A1C
Hgb A1c MFr Bld: 4.9 % (ref 4.8–5.6)
Mean Plasma Glucose: 93.93 mg/dL

## 2021-11-01 MED ORDER — SODIUM BICARBONATE 650 MG PO TABS
650.0000 mg | ORAL_TABLET | Freq: Once | ORAL | Status: AC
  Administered 2021-11-01: 650 mg
  Filled 2021-11-01: qty 1

## 2021-11-01 MED ORDER — INSULIN ASPART 100 UNIT/ML IJ SOLN
0.0000 [IU] | INTRAMUSCULAR | Status: DC
  Administered 2021-11-01 – 2021-11-02 (×2): 1 [IU] via SUBCUTANEOUS
  Administered 2021-11-02: 2 [IU] via SUBCUTANEOUS
  Administered 2021-11-02 – 2021-11-03 (×3): 1 [IU] via SUBCUTANEOUS
  Administered 2021-11-03: 4 [IU] via SUBCUTANEOUS
  Administered 2021-11-03 – 2021-11-06 (×13): 1 [IU] via SUBCUTANEOUS
  Filled 2021-11-01 (×19): qty 1

## 2021-11-01 MED ORDER — MAGNESIUM SULFATE 2 GM/50ML IV SOLN
2.0000 g | Freq: Once | INTRAVENOUS | Status: AC
  Administered 2021-11-01: 2 g via INTRAVENOUS
  Filled 2021-11-01: qty 50

## 2021-11-01 MED ORDER — DIATRIZOATE MEGLUMINE & SODIUM 66-10 % PO SOLN
90.0000 mL | Freq: Once | ORAL | Status: AC
  Administered 2021-11-01: 90 mL via NASOGASTRIC

## 2021-11-01 MED ORDER — SODIUM CHLORIDE 0.9% FLUSH: 10.0000 mL | INTRAVENOUS | Status: DC | PRN

## 2021-11-01 MED ORDER — MENTHOL 3 MG MT LOZG
1.0000 | LOZENGE | OROMUCOSAL | Status: DC | PRN
  Administered 2021-11-01 – 2021-11-07 (×2): 3 mg via ORAL
  Filled 2021-11-01 (×2): qty 9

## 2021-11-01 MED ORDER — CHLORHEXIDINE GLUCONATE CLOTH 2 % EX PADS
6.0000 | MEDICATED_PAD | Freq: Every day | CUTANEOUS | Status: DC
  Administered 2021-11-01 – 2021-11-23 (×23): 6 via TOPICAL

## 2021-11-01 MED ORDER — PANCRELIPASE (LIP-PROT-AMYL) 10440-39150 UNITS PO TABS
20880.0000 [IU] | ORAL_TABLET | Freq: Once | ORAL | Status: AC
  Administered 2021-11-01: 20880 [IU]
  Filled 2021-11-01: qty 2

## 2021-11-01 MED ORDER — CARVEDILOL 12.5 MG PO TABS
12.5000 mg | ORAL_TABLET | Freq: Two times a day (BID) | ORAL | Status: DC
  Administered 2021-11-01: 12.5 mg via ORAL
  Filled 2021-11-01: qty 1

## 2021-11-01 MED ORDER — SODIUM CHLORIDE 0.9% FLUSH
10.0000 mL | Freq: Two times a day (BID) | INTRAVENOUS | Status: DC
  Administered 2021-11-01: 10 mL
  Administered 2021-11-01: 20 mL
  Administered 2021-11-02 – 2021-11-09 (×11): 10 mL
  Administered 2021-11-09: 20 mL
  Administered 2021-11-10 – 2021-11-12 (×5): 10 mL
  Administered 2021-11-13: 20 mL
  Administered 2021-11-13 – 2021-11-22 (×19): 10 mL
  Administered 2021-11-22: 20 mL
  Administered 2021-11-23 (×2): 10 mL

## 2021-11-01 MED ORDER — CARVEDILOL 6.25 MG PO TABS
6.2500 mg | ORAL_TABLET | Freq: Two times a day (BID) | ORAL | Status: DC
  Administered 2021-11-01: 6.25 mg via ORAL
  Filled 2021-11-01 (×2): qty 1

## 2021-11-01 MED ORDER — TRACE MINERALS CU-MN-SE-ZN 300-55-60-3000 MCG/ML IV SOLN
INTRAVENOUS | Status: AC
  Filled 2021-11-01: qty 295.67

## 2021-11-01 NOTE — Progress Notes (Signed)
Peripherally Inserted Central Catheter Placement  The IV Nurse has discussed with the patient and/or persons authorized to consent for the patient, the purpose of this procedure and the potential benefits and risks involved with this procedure.  The benefits include less needle sticks, lab draws from the catheter, and the patient may be discharged home with the catheter. Risks include, but not limited to, infection, bleeding, blood clot (thrombus formation), and puncture of an artery; nerve damage and irregular heartbeat and possibility to perform a PICC exchange if needed/ordered by physician.  Alternatives to this procedure were also discussed.  Bard Power PICC patient education guide, fact sheet on infection prevention and patient information card has been provided to patient /or left at bedside.    PICC Placement Documentation  PICC Double Lumen 11/01/21 Right Brachial 37 cm 0 cm (Active)  Indication for Insertion or Continuance of Line Administration of hyperosmolar/irritating solutions (i.e. TPN, Vancomycin, etc.) 11/01/21 1300  Exposed Catheter (cm) 0 cm 11/01/21 1300  Site Assessment Clean;Dry;Intact 11/01/21 1300  Lumen #1 Status Flushed;Saline locked;Blood return noted 11/01/21 1300  Lumen #2 Status Flushed;Saline locked;Blood return noted 11/01/21 1300  Dressing Type Transparent;Securing device 11/01/21 1300  Dressing Status Clean;Dry;Intact 11/01/21 1300  Antimicrobial disc in place? Yes 11/01/21 1300  Safety Lock Not Applicable 07/07/29 0762  Dressing Intervention Other (Comment) 11/01/21 1300  Dressing Change Due 11/08/21 11/01/21 1300       Enos Fling 11/01/2021, 1:02 PM

## 2021-11-01 NOTE — Progress Notes (Signed)
Verified with Xray who confirmed placement and okay to initiate suction to NGT, will continue suction for the next 2 hours and then complete gastrografin.

## 2021-11-01 NOTE — Progress Notes (Signed)
Patient's NG tube was assessed and found to be clogged during first round. Attempted to apply the de-clogging protocol without success. Surgeon put in orders to exchange NG tube. New NG tube placed by Rosaria Ferries, RN. Patient tolerated well. Awaiting the results of verification XRAY.

## 2021-11-01 NOTE — Progress Notes (Signed)
Patient ID: Danielle [name], female   DOB: 03/15/1956, 66 y.o.   MRN: [id number] [facility] PROGRESS NOTE  Danielle [name] [id number]:[id number] DOB: [birth year] DOA: 10/23/2021 PCP: [facility]  HPI/Subjective: Patient stated that she did have a bowel movement yesterday.  She states she is not passing gas.  She states she wears oxygen as needed at home.  Heart rates up in the 160s and pulse ox drops with ambulation.  Physical therapy still recommending rehab.  Patient initially admitted for ileostomy reversal.  Objective: Vitals:   11/01/21 0622 11/01/21 0730  BP: (!) 146/89 120/84  Pulse:  (!) 104  Resp:    Temp:  98.8 F (37.1 C)  SpO2:  97%    Intake/Output Summary (Last 24 hours) at 11/01/2021 1258 Last data filed at 11/01/2021 3474 Gross per 24 hour  Intake 2060 ml  Output 1250 ml  Net 810 ml   Filed Weights   10/23/21 0609 10/31/21 0500 11/01/21 0416  Weight: 56.2 kg 66.5 kg 66.3 kg    ROS: Review of Systems  Respiratory:  Negative for shortness of breath.   Cardiovascular:  Negative for chest pain.  Gastrointestinal:  Positive for abdominal pain. Negative for nausea and vomiting.  Exam: Physical Exam HENT:     Head: Normocephalic.     Mouth/Throat:     Pharynx: No oropharyngeal exudate.  Eyes:     General: Lids are normal.     Conjunctiva/sclera: Conjunctivae normal.  Cardiovascular:     Rate and Rhythm: Tachycardia present. Rhythm irregularly irregular.     Heart sounds: Normal heart sounds, S1 normal and S2 normal.  Pulmonary:     Breath sounds: Examination of the right-lower field reveals decreased breath sounds. Examination of the left-lower field reveals decreased breath sounds. Decreased breath sounds present. No wheezing, rhonchi or rales.  Abdominal:     Palpations: Abdomen is soft.     Tenderness: There is generalized abdominal tenderness.  Musculoskeletal:     Right lower leg: No swelling.     Left lower leg: No swelling.  Skin:     General: Skin is warm.     Findings: No rash.  Neurological:     Mental Status: She is alert and oriented to person, place, and time.      Scheduled Meds:  apixaban  2.5 mg Oral BID   bisacodyl  10 mg Rectal Daily   carvedilol  12.5 mg Oral BID WC   citalopram  40 mg Oral BH-q7a   diatrizoate meglumine-sodium  90 mL Per NG tube Once   diltiazem  240 mg Oral Daily   fluticasone furoate-vilanterol  1 puff Inhalation Daily   And   umeclidinium bromide  1 puff Inhalation Daily   gabapentin  300 mg Oral BID   insulin aspart  0-9 Units Subcutaneous Q4H   metoCLOPramide (REGLAN) injection  5 mg Intravenous Q8H   multivitamin with minerals  1 tablet Oral Daily   pantoprazole (PROTONIX) IV  40 mg Intravenous QHS   Continuous Infusions:  dextrose 5 % and 0.45 % NaCl with KCl 20 mEq/L 75 mL/hr at 11/01/21 1001   magnesium sulfate bolus IVPB     TPN ADULT (ION)      Assessment/Plan:  Atrial fibrillation with rapid ventricular response.  Restarted Coreg this morning and will increase the dose for this evening.  Already on Cardizem CD and Eliquis at this time. Acute hypoxic respiratory failure.  With ambulation did drop her saturations into the  80s.  Unable to come off oxygen yet. Acute on chronic diastolic congestive heart failure.  Patient has been given intermittent Lasix here.  We will continue to monitor Acute of chronic disease.  Last hemoglobin 7.6. Hypomagnesemia.  IV magnesium ordered for today Acute kidney injury on chronic kidney disease stage IIIa.  Creatinine peaked at 3.25 on 10/25/2021.  Creatinine down to 1.23. Hyponatremia with a sodium of 129 on 10/24/2021.  Sodium in the normal range now. Ileostomy reversal with postoperative ileus.  Gastrografin study today as per gastroenterology.  PICC line and TPN also ordered by general surgery.        Code Status:     Code Status Orders  (From admission, onward)           Start     Ordered   10/23/21 1801  Full code   Continuous        10/23/21 1800           Code Status History     Date Active Date Inactive Code Status Order ID Comments User Context   11/17/2020 0432 11/18/2020 1748 Full Code [id number]  Athena Masse, MD ED   11/05/2020 0604 11/10/2020 1631 Full Code [id number]  Benjamine Sprague, [place] ED   08/01/2020 1124 08/05/2020 2228 Full Code [id number]  Collier Bullock, MD ED   01/20/2020 0807 01/21/2020 2051 Full Code [id number]  Ivor Costa, MD ED   11/06/2019 1648 11/11/2019 1749 Full Code [id number]  Max Sane, MD ED   10/29/2019 1436 11/04/2019 1859 Full Code [id number]  Ivor Costa, MD ED   10/14/2019 0253 10/21/2019 1810 Full Code [id number]  Athena Masse, MD ED   09/27/2019 0211 10/02/2019 1736 Full Code [id number]  Athena Masse, MD ED   06/19/2019 1124 06/20/2019 1742 Full Code [id number]  Lang Snow, NP ED   12/22/2018 0844 12/24/2018 1727 Full Code [id number]  Harrie Foreman, MD Inpatient   09/28/2018 0813 10/04/2018 1522 Full Code [id number]  Herbert Pun, MD Inpatient   09/05/2018 0616 09/10/2018 1708 Full Code [id number]  Arta Silence, MD ED   08/12/2018 0540 08/16/2018 2057 Full Code [id number]  Arta Silence, MD ED   07/21/2018 0332 07/23/2018 1718 Full Code [id number]  Derrek Monaco, RN Inpatient   07/18/2018 0640 07/21/2018 0330 DNR [id number]  Harrie Foreman, MD Inpatient   07/05/2018 0808 07/09/2018 1946 DNR [id number]  Harrie Foreman, MD Inpatient   06/30/2018 1733 07/02/2018 1507 DNR [id number]  [name], MD Inpatient   06/30/2018 0019 06/30/2018 1733 Full Code [id number]  Amelia Jo, MD Inpatient   06/04/2018 0113 06/05/2018 1448 Full Code [id number]  Amelia Jo, MD Inpatient   04/21/2018 0633 05/17/2018 2214 Full Code [id number]  Arta Silence, MD Inpatient   02/09/2017 1125 02/11/2017 1706 Full Code [id number]  Henreitta Leber, MD Inpatient      Family Communication: Updated patient's daughter on the phone Disposition Plan: Status is:  Inpatient.  General surgery ordering PICC line and TPN.  [place]  [facility] [employer]

## 2021-11-01 NOTE — Progress Notes (Signed)
Mobility Specialist - Progress Note   11/01/21 1400  Mobility  Activity Contraindicated/medical hold     Pt prepping for DG on arrival. Will attempt another date/time.    Kathee Delton Mobility Specialist 11/01/21, 2:04 PM

## 2021-11-01 NOTE — Progress Notes (Signed)
Nutrition Follow Up Note   DOCUMENTATION CODES:   Non-severe (moderate) malnutrition in context of chronic illness  INTERVENTION:   TPN per pharmacy   Recommend thiamine 100mg  daily added to TPN x 3 days   Pt at high refeed risk; recommend monitor potassium, magnesium and phosphorus labs daily until stable  Daily weights   NUTRITION DIAGNOSIS:   Moderate Malnutrition related to cancer and cancer related treatments as evidenced by mild fat depletion, mild muscle depletion, severe muscle depletion.  GOAL:   Patient will meet greater than or equal to 90% of their needs -not met   MONITOR:   Diet advancement, Labs, Weight trends, Skin, I & O's, TPN  ASSESSMENT:   66 y.o. 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 NGT currently clogged; output was 655ml overnight. Plan is to replace NGT. Pt reports having a bowel movement yesterday. Reglan initiated 1/15. Pt undergoing gastrografin challenge today. Pt remains NPO. Pt is now without adequate nutrition for > 7 days. Plan today is for PICC line and TPN. Pt is at high refeed risk. Per chart, pt is up ~22lbs since admission; pt +4.8L on her I & Os. Pt at high risk for volume overload; will monitor daily weights. IVF to discontinue once TPN starts.   Medications reviewed and include: celexa, reglan, MVI, protonix, NaCl w/ KCl and 5% dextrose @75ml /hr    Labs reviewed: K 3.9 wnl, creat  1.25(H), P 2.1(L), Mg 1.3(L) Hgb 8.0(L), Hct 25.2(L)  Diet Order:    Diet Order             Diet NPO time specified Except for: Sips with Meds  Diet effective now                  EDUCATION NEEDS:   Education needs have been addressed  Skin:  Skin Assessment: Reviewed RN Assessment (Incision abdomen)  Last BM:  1/17  Height:   Ht Readings from Last 1 Encounters:  10/23/21 5\' 6"  (1.676 m)    Weight:   Wt Readings from Last 1 Encounters:  11/01/21 66.3 kg    Ideal Body Weight:  59 kg  BMI:  Body mass index is 23.59 kg/m.  Estimated Nutritional Needs:   Kcal:  1700-1900kcal/day  Protein:  85-95g/day  Fluid:  1.2-1.5L/day  Danielle Distance MS, RD, LDN Please refer to [facility] for RD and/or RD on-call/weekend/after hours pager

## 2021-11-01 NOTE — Progress Notes (Signed)
Chaplain Maggie introduced pastoral care. Pt content and watching television. She shared that she is feeling better and in need of rest. Chaplain expects to follow up.

## 2021-11-01 NOTE — Progress Notes (Signed)
Patient ID: MARENA WITTS, female   DOB: January 11, 1956, 66 y.o.   MRN: [id number]     [place] [facility] Day(s): 9.   Interval History: Patient seen and examined, no acute events or new complaints overnight. Patient reports feeling a little bit more comfortable today.  Still not passing gas.  Vital signs in last 24 hours: [min-max] current  Temp:  [98.1 F (36.7 C)-98.8 F (37.1 C)] 98.1 F (36.7 C) (01/18 1932) Pulse Rate:  [77-104] 82 (01/18 1932) Resp:  [16-18] 16 (01/18 1932) BP: (120-146)/(66-89) 133/77 (01/18 1932) SpO2:  [94 %-97 %] 94 % (01/18 1932) Weight:  [66.2 kg-66.3 kg] 66.2 kg (01/18 1515)     Height: 5\' 6"  (167.6 cm) Weight: 66.2 kg BMI (Calculated): 23.57   Physical Exam:  Constitutional: alert, cooperative and no distress  Respiratory: breathing non-labored at rest  Cardiovascular: regular rate and sinus rhythm  Gastrointestinal: soft, non-tender, and mild-distended  Labs:  CBC Latest Ref Rng & Units 11/01/2021 10/30/2021 10/29/2021  WBC 4.0 - 10.5 K/uL 9.4 8.4 9.2  Hemoglobin 12.0 - 15.0 g/dL 7.6(L) 8.0(L) 7.8(L)  Hematocrit 36.0 - 46.0 % 23.8(L) 25.2(L) 23.8(L)  Platelets 150 - 400 K/uL 346 358 341   CMP Latest Ref Rng & Units 11/01/2021 10/31/2021 10/30/2021  Glucose 70 - 99 mg/dL 106(H) 111(H) 122(H)  BUN 8 - 23 mg/dL 14 14 15   Creatinine 0.44 - 1.00 mg/dL 1.23(H) 1.28(H) 1.25(H)  Sodium 135 - 145 mmol/L 135 138 137  Potassium 3.5 - 5.1 mmol/L 4.0 4.1 3.9  Chloride 98 - 111 mmol/L 94(L) 96(L) 98  CO2 22 - 32 mmol/L 34(H) 34(H) 31  Calcium 8.9 - 10.3 mg/dL 8.4(L) 8.3(L) 8.1(L)  Total Protein 6.5 - 8.1 g/dL - - -  Total Bilirubin 0.3 - 1.2 mg/dL - - -  Alkaline Phos 38 - 126 U/L - - -  AST 15 - 41 U/L - - -  ALT 0 - 44 U/L - - -    Imaging studies: No new pertinent imaging studies   Assessment/Plan:  66 y.o. female with history of an ileostomy and mucous fistula 9 Day Post-Op s/p ileostomy reversal, complicated by pertinent comorbidities  including COPD, atrial fibrillation on anticoagulation, hypertension, chronic kidney disease stage III.   Status post ileostomy reversal -Patient with postoperative ileus.  NGT in place.  -Reglan 5 mg 3 times per day -We will do Gastrografin challenge for further evaluation of GI motility -We will order TPN for parenteral nutrition   Chronic kidney disease stage III -Acute kidney injury after surgery.  This seem to be resolved.  -Appreciate hospitalist and nephrology evaluation and recommendations.   Encourage patient to get out of bed.  Will continue pain management.   Arnold Long, MD

## 2021-11-01 NOTE — Progress Notes (Signed)
PHARMACY - TOTAL PARENTERAL NUTRITION CONSULT NOTE   Indication: Prolonged ileus  Patient Measurements: Height: 5\' 6"  (167.6 cm) Weight: 66.3 kg (146 lb 2.6 oz) IBW/kg (Calculated) : 59.3 TPN AdjBW (KG): 56.2 Body mass index is 23.59 kg/m.  Assessment: 66 y.o. female with a past medical history of anemia, anxiety, aortic atherosclerosis, aortic valve stenosis, chronic atrial fibrillation, stage IV CKD, breast cancer with history of chemotherapy, history of radiotherapy, COPD, depression, diastolic dysfunction, GERD, hyperlipidemia, hypertension, lymphedema, small bowel obstruction, vitamin D deficiency who presented to the hospital for an colostomy reversal. Pharmacy has been consulted for TPN.   Glucose / Insulin: BG <180, not on any antihyperglycemics currently Electrolytes: MG 1.5 Renal: Scr 1.23 Hepatic: 1/11 LFTs WNL Intake / Output; MIVF: +4.8 L, on D5 1/2NS w/ 20 mEq Kcl @75ml /hr GI Imaging: 1/18 abdomen: persistent small bowel ileus GI Surgeries / Procedures: N/A  Central access: to be placed 1/18 TPN start date: 1/18  Nutritional Goals: Goal TPN rate is 55 mL/hr (provides 87.12 g of protein and 1710.32 kcals per day)  RD Assessment: Estimated Needs Total Energy Estimated Needs: 1700-1900kcal/day Total Protein Estimated Needs: 85-95g/day Total Fluid Estimated Needs: 1.2-1.5L/day  Current Nutrition:  NPO  Plan:  Start TPN at ~half rate at 28 mL/hr at 1800 Electrolytes in TPN: Na 58mEq/L, K 33mEq/L, Ca 25mEq/L, Mg 13mEq/L, and Phos 67mmol/L. Cl:Ac 1:1 Mg 1.5 - will order 2 g IV Mg sulfate x 1 dose Add standard MVI and trace elements to TPN Add thiamine 100 mg x 3 days per dietician rec (via secure chat) Initiate Sensitive q4h SSI and adjust as needed  Discontinue MIVF at 1800 Monitor TPN labs on Mon/Thurs  Fanta Wimberley O Janoah Menna 11/01/2021,10:36 AM

## 2021-11-01 NOTE — Progress Notes (Signed)
Physical Therapy Treatment Patient Details Name: Danielle [name] MRN: [id number] DOB: [birth year] Today's Date: 11/01/2021   History of Present Illness 66 y/o female s/p iliostomy take down 1/09.    PT Comments    Pt is making gradual progress towards goals and reports she prefers for future therapy sessions to be performed in PM. Initially hesitant to performance, however agreeable to ambulate short distance in room. Quick fatigue noted with elevated HR and decreased in O2 sats. RN notified. Pt reports she has sufficient family support in home for successful transition to home and currently declines SNF placement. Anticipate pt will required constant physical assist for OOB mobility. Will update recs. Will continue to progress as able.   Recommendations for follow up therapy are one component of a multi-disciplinary discharge planning process, led by the attending physician.  Recommendations may be updated based on patient status, additional functional criteria and insurance authorization.  Follow Up Recommendations  Home health PT     Assistance Recommended at Discharge Frequent or constant Supervision/Assistance  Patient can return home with the following A lot of help with walking and/or transfers;A lot of help with bathing/dressing/bathroom   Equipment Recommendations  Rolling walker (2 wheels)    Recommendations for Other Services       Precautions / Restrictions Precautions Precautions: Fall Restrictions Weight Bearing Restrictions: No     Mobility  Bed Mobility Overal bed mobility: Needs Assistance Bed Mobility: Supine to Sit, Sit to Supine     Supine to sit: Modified independent (Device/Increase time)     General bed mobility comments: no assist required. Pt able to easily come to sitting at EOB.    Transfers Overall transfer level: Needs assistance Equipment used: Rolling walker (2 wheels) Transfers: Sit to/from Stand Sit to Stand: Min guard            General transfer comment: initially pulls up on RW, however then able to place hands on bed and push from seated surface with greater success in transfer. Once standing, upright posture noted. NG removed from suction via RN prior to OOB mobility    Ambulation/Gait Ambulation/Gait assistance: Min guard Gait Distance (Feet): 14 Feet Assistive device: Rolling walker (2 wheels) Gait Pattern/deviations: Step-to pattern       General Gait Details: slow and cautious ambulation around bed. Due to O2 line, removed for exertion and replaced immediately once seated in recliner. Pt fatigues rapidly with O2 sats decreasing to 78% and HR sustaining in 160s. RN in room. With cues for PLB, O2 improved to 94% on 3L of O2.   Stairs             Wheelchair Mobility    Modified Rankin (Stroke Patients Only)       Balance Overall balance assessment: Needs assistance Sitting-balance support: No upper extremity supported Sitting balance-Leahy Scale: Good     Standing balance support: Bilateral upper extremity supported Standing balance-Leahy Scale: Poor                              Cognition Arousal/Alertness: Awake/alert Behavior During Therapy: WFL for tasks assessed/performed Overall Cognitive Status: Within Functional Limits for tasks assessed                                          Exercises Other Exercises Other Exercises: supine ther-ex performed on  B LE including SLRs and heel slides. 10 reps performed with cga and encouragement for paricipation    General Comments        Pertinent Vitals/Pain Pain Assessment Pain Assessment: Faces Faces Pain Scale: Hurts a little bit Pain Location: abdomen Pain Descriptors / Indicators: Aching Pain Intervention(s): Limited activity within patient's tolerance    Home Living                          Prior Function            PT Goals (current goals can now be found in the care plan  section) Acute Rehab PT Goals Patient Stated Goal: go home PT Goal Formulation: With patient Time For Goal Achievement: 11/07/21 Potential to Achieve Goals: Fair Progress towards PT goals: Progressing toward goals    Frequency    Min 2X/week      PT Plan Discharge plan needs to be updated    Co-evaluation              AM-PAC PT "6 Clicks" Mobility   Outcome Measure  Help needed turning from your back to your side while in a flat bed without using bedrails?: None Help needed moving from lying on your back to sitting on the side of a flat bed without using bedrails?: None Help needed moving to and from a bed to a chair (including a wheelchair)?: A Little Help needed standing up from a chair using your arms (e.g., wheelchair or bedside chair)?: A Little Help needed to walk in hospital room?: A Little Help needed climbing 3-5 steps with a railing? : Total 6 Click Score: 18    End of Session Equipment Utilized During Treatment: Gait belt;Oxygen Activity Tolerance: Patient limited by pain;Treatment limited secondary to medical complications (Comment) Patient left: in chair;with chair alarm set Nurse Communication: Mobility status PT Visit Diagnosis: Muscle weakness (generalized) (M62.81);Difficulty in walking, not elsewhere classified (R26.2)     Time: 2248-2500 PT Time Calculation (min) (ACUTE ONLY): 24 min  Charges:  $Gait Training: 8-22 mins $Therapeutic Exercise: 8-22 mins                     Greggory Stallion, PT, DPT, GCS [phone]    Truman Aceituno 11/01/2021, 10:50 AM

## 2021-11-02 ENCOUNTER — Ambulatory Visit: Payer: Medicare Other | Admitting: Gastroenterology

## 2021-11-02 ENCOUNTER — Inpatient Hospital Stay: Payer: Medicare Other

## 2021-11-02 DIAGNOSIS — K9189 Other postprocedural complications and disorders of digestive system: Secondary | ICD-10-CM

## 2021-11-02 DIAGNOSIS — K567 Ileus, unspecified: Secondary | ICD-10-CM

## 2021-11-02 LAB — COMPREHENSIVE METABOLIC PANEL
ALT: 9 U/L (ref 0–44)
AST: 15 U/L (ref 15–41)
Albumin: 2.6 g/dL — ABNORMAL LOW (ref 3.5–5.0)
Alkaline Phosphatase: 49 U/L (ref 38–126)
Anion gap: 5 (ref 5–15)
BUN: 19 mg/dL (ref 8–23)
CO2: 36 mmol/L — ABNORMAL HIGH (ref 22–32)
Calcium: 8.6 mg/dL — ABNORMAL LOW (ref 8.9–10.3)
Chloride: 96 mmol/L — ABNORMAL LOW (ref 98–111)
Creatinine, Ser: 1.46 mg/dL — ABNORMAL HIGH (ref 0.44–1.00)
GFR, Estimated: 39 mL/min — ABNORMAL LOW (ref >60)
Glucose, Bld: 115 mg/dL — ABNORMAL HIGH (ref 70–99)
Potassium: 3.9 mmol/L (ref 3.5–5.1)
Sodium: 137 mmol/L (ref 135–145)
Total Bilirubin: 1.5 mg/dL — ABNORMAL HIGH (ref 0.3–1.2)
Total Protein: 5.9 g/dL — ABNORMAL LOW (ref 6.5–8.1)

## 2021-11-02 LAB — GLUCOSE, CAPILLARY
Glucose-Capillary: 128 mg/dL — ABNORMAL HIGH (ref 70–99)
Glucose-Capillary: 122 mg/dL — ABNORMAL HIGH (ref 70–99)
Glucose-Capillary: 122 mg/dL — ABNORMAL HIGH (ref 70–99)
Glucose-Capillary: 119 mg/dL — ABNORMAL HIGH (ref 70–99)
Glucose-Capillary: 151 mg/dL — ABNORMAL HIGH (ref 70–99)
Glucose-Capillary: 99 mg/dL (ref 70–99)

## 2021-11-02 LAB — PHOSPHORUS: Phosphorus: 3.3 mg/dL (ref 2.5–4.6)

## 2021-11-02 LAB — MAGNESIUM: Magnesium: 2 mg/dL (ref 1.7–2.4)

## 2021-11-02 LAB — TRIGLYCERIDES: Triglycerides: 57 mg/dL (ref <150)

## 2021-11-02 MED ORDER — TRACE MINERALS CU-MN-SE-ZN 300-55-60-3000 MCG/ML IV SOLN
INTRAVENOUS | Status: AC
  Filled 2021-11-02: qty 580.8

## 2021-11-02 MED ORDER — CARVEDILOL 6.25 MG PO TABS
3.1250 mg | ORAL_TABLET | Freq: Two times a day (BID) | ORAL | Status: DC
  Administered 2021-11-02 – 2021-11-05 (×8): 3.125 mg via ORAL
  Filled 2021-11-02 (×9): qty 1

## 2021-11-02 NOTE — Progress Notes (Signed)
PT Cancellation Note  Patient Details Name: Danielle [name] MRN: [id number] DOB: [birth year]   Cancelled Treatment:    Reason Eval/Treat Not Completed: Other (comment) (Pt declined, had just been seen by mobility tech and ambulated in hallway and used bathroom. States she was vary fatigued form entounter.)   Particia Lather 11/02/2021, 4:44 PM

## 2021-11-02 NOTE — Plan of Care (Signed)
°  Problem: Clinical Measurements: Goal: Ability to maintain clinical measurements within normal limits will improve Outcome: Progressing Goal: Will remain free from infection Outcome: Progressing Goal: Diagnostic test results will improve Outcome: Progressing Goal: Respiratory complications will improve Outcome: Progressing Goal: Cardiovascular complication will be avoided Outcome: Progressing   Pt is involved in and agrees with the plan of care. V/S stable. BM noted 5 times this shift - watery stool. Voiding well. Ngt in place; draining greenish output. No complaints of pain at this time.

## 2021-11-02 NOTE — Progress Notes (Signed)
Patient ID: Danielle [name], female   DOB: 1956-06-02, 66 y.o.   MRN: [id number]     [facility] Day(s): 10.   Interval History: Patient seen and examined, no acute events or new complaints overnight. Patient reports she had a bowel movement but not passing much gas. Still with soreness of the abdomen.   Vital signs in last 24 hours: [min-max] current  Temp:  [98 F (36.7 C)-98.7 F (37.1 C)] 98 F (36.7 C) (01/19 0357) Pulse Rate:  [74-82] 74 (01/19 0357) Resp:  [16-18] 18 (01/19 0357) BP: (98-133)/(66-77) 98/67 (01/19 0357) SpO2:  [94 %-98 %] 96 % (01/19 0850) Weight:  [66.2 kg-69.7 kg] 69.7 kg (01/19 0500)     Height: 5\' 6"  (167.6 cm) Weight: 69.7 kg BMI (Calculated): 24.81   Physical Exam:  Constitutional: alert, cooperative and no distress  Respiratory: breathing non-labored at rest  Cardiovascular: regular rate and sinus rhythm  Gastrointestinal: soft, non-tender, and mild-distended  Labs:  CBC Latest Ref Rng & Units 11/01/2021 10/30/2021 10/29/2021  WBC 4.0 - 10.5 K/uL 9.4 8.4 9.2  Hemoglobin 12.0 - 15.0 g/dL 7.6(L) 8.0(L) 7.8(L)  Hematocrit 36.0 - 46.0 % 23.8(L) 25.2(L) 23.8(L)  Platelets 150 - 400 K/uL 346 358 341   CMP Latest Ref Rng & Units 11/02/2021 11/01/2021 10/31/2021  Glucose 70 - 99 mg/dL 115(H) 106(H) 111(H)  BUN 8 - 23 mg/dL 19 14 14   Creatinine 0.44 - 1.00 mg/dL 1.46(H) 1.23(H) 1.28(H)  Sodium 135 - 145 mmol/L 137 135 138  Potassium 3.5 - 5.1 mmol/L 3.9 4.0 4.1  Chloride 98 - 111 mmol/L 96(L) 94(L) 96(L)  CO2 22 - 32 mmol/L 36(H) 34(H) 34(H)  Calcium 8.9 - 10.3 mg/dL 8.6(L) 8.4(L) 8.3(L)  Total Protein 6.5 - 8.1 g/dL 5.9(L) - -  Total Bilirubin 0.3 - 1.2 mg/dL 1.5(H) - -  Alkaline Phos 38 - 126 U/L 49 - -  AST 15 - 41 U/L 15 - -  ALT 0 - 44 U/L 9 - -    Imaging studies: Abdominal x-ray shows persistent small bowel dilation.  Gastrografin reached transverse colon.   Assessment/Plan:  66 y.o. female with history of an ileostomy and  mucous fistula 10 Day Post-Op s/p ileostomy reversal, complicated by pertinent comorbidities including COPD, atrial fibrillation on anticoagulation, hypertension, chronic kidney disease stage III.   Status post ileostomy reversal -Patient with postoperative ileus.  NGT in place.  -I will consider increasing reglan dose -Gastrografin shows contrast in large intestine.  Still with dilated small bowel -We will continue with conservative management with NGT decompression, keeping electrolytes within normal limits.  Continue with TPN.   Chronic kidney disease stage III -Acute kidney injury after surgery.  This seem to be resolved.  -Appreciate hospitalist and nephrology evaluation and recommendations.  A. Fib -Intermittent rate control -Currently on therapeutic anticoagulation -Appreciate hospitalist evaluation and management   Encourage patient to get out of bed.  Will continue pain management.   Arnold Long, MD

## 2021-11-02 NOTE — Progress Notes (Signed)
Mobility Specialist - Progress Note   11/02/21 1500  Mobility  Activity Ambulated independently to bathroom;Ambulated with assistance in room  Level of Assistance Standby assist, set-up cues, supervision of patient - no hands on  Assistive Device Front wheel walker;BSC  Distance Ambulated (ft) 25 ft  Activity Response Tolerated well  Transport method Ambulatory  $Mobility charge 1 Mobility    During mobility: 117 HR, 89-92% SpO2 Post-mobility: 95 HR, 94% SpO2   Pt sleeping upon arrival, awakened by voice. Utilizing 2L pt exited bed with minA and ambulated just outside the door and back. O2 maintained upper 80s-90s majority of session, but did desat to a low of 78% once seated on BSC for small watery stool. 3L for recovery then weaned back to 2L. Pt returned supine with all needs in reach. RN notified.    Kathee Delton Mobility Specialist 11/02/21, 4:28 PM

## 2021-11-02 NOTE — Care Management Important Message (Signed)
Important Message  Patient Details  Name: ZALIA HAUTALA MRN: [id number] Date of Birth: 1956-03-09   Medicare Important Message Given:  Yes  Asleep upon time of visit.  Confirmed copy of Medicare IM on counter in patient's room for reference.    Dannette K[place] 11/02/2021, 2:50 PM

## 2021-11-02 NOTE — Progress Notes (Signed)
CanPatient ID: Danielle [name], female   DOB: [birth year], 66 y.o.   MRN: [id number] [facility] PROGRESS NOTE  Danielle [name] JJO:[id number] DOB: Dec 03, 1955 DOA: 10/23/2021 PCP: [facility]  HPI/Subjective: Patient having some soreness in her abdomen.  Still has NG tube in.  Started on TPN yesterday.  Initially admitted for elective ileostomy takedown.  Objective: Vitals:   11/02/21 0357 11/02/21 0850  BP: 98/67   Pulse: 74   Resp: 18   Temp: 98 F (36.7 C)   SpO2: 98% 96%    Intake/Output Summary (Last 24 hours) at 11/02/2021 1409 Last data filed at 11/02/2021 1317 Gross per 24 hour  Intake 757.67 ml  Output 750 ml  Net 7.67 ml   Filed Weights   11/01/21 0416 11/01/21 1515 11/02/21 0500  Weight: 66.3 kg 66.2 kg 69.7 kg    ROS: Review of Systems  Respiratory:  Positive for shortness of breath.   Cardiovascular:  Negative for chest pain.  Gastrointestinal:  Positive for abdominal pain. Negative for nausea and vomiting.  Exam: Physical Exam HENT:     Head: Normocephalic.     Mouth/Throat:     Pharynx: No oropharyngeal exudate.  Eyes:     General: Lids are normal.     Conjunctiva/sclera: Conjunctivae normal.  Cardiovascular:     Rate and Rhythm: Normal rate and regular rhythm.     Heart sounds: Normal heart sounds, S1 normal and S2 normal.  Pulmonary:     Breath sounds: No decreased breath sounds, wheezing, rhonchi or rales.  Abdominal:     Palpations: Abdomen is soft.     Tenderness: There is no abdominal tenderness.  Musculoskeletal:     Right lower leg: No swelling.     Left lower leg: No swelling.  Skin:    General: Skin is warm.     Findings: No rash.  Neurological:     Mental Status: She is alert and oriented to person, place, and time.      Scheduled Meds:  apixaban  2.5 mg Oral BID   bisacodyl  10 mg Rectal Daily   carvedilol  3.125 mg Oral BID WC   Chlorhexidine Gluconate Cloth  6 each Topical Daily   citalopram  40 mg  Oral BH-q7a   diltiazem  240 mg Oral Daily   fluticasone furoate-vilanterol  1 puff Inhalation Daily   And   umeclidinium bromide  1 puff Inhalation Daily   gabapentin  300 mg Oral BID   insulin aspart  0-9 Units Subcutaneous Q4H   metoCLOPramide (REGLAN) injection  5 mg Intravenous Q8H   pantoprazole (PROTONIX) IV  40 mg Intravenous QHS   sodium chloride flush  10-40 mL Intracatheter Q12H   Continuous Infusions:  TPN ADULT (ION) 28 mL/hr at 11/01/21 1848   TPN ADULT (ION)      Assessment/Plan:  Paroxysmal atrial fibrillation.  Had to cut back on the dose of Coreg secondary to relative hypotension.  Continue Cardizem CD and Eliquis for anticoagulation. Acute hypoxic respiratory failure.  With ambulation yesterday did drop her saturations into the 80s.  Unable to come off oxygen as of yet.  Patient does wear oxygen as needed at home. Acute on chronic diastolic congestive heart failure.  Patient has been given intermittent Lasix here.  Continue to monitor. Anemia of chronic disease.  Last hemoglobin 7.6.  Recheck hemoglobin tomorrow. Acute kidney injury on chronic kidney disease stage IIIa.  Creatinine peaked at 3.25 follow-up 10/25/2021.  Creatinine up from  yesterday at 1.46.  Continue TPN.  Discontinue IV fluids.  Continue to monitor closely. Hyponatremia on admission.  Sodium now in normal range Postoperative ileus.  Patient had ileostomy reversal.  Gastrografin study yesterday shows that the fluid is moving through.  Still has dilation of the small bowel.  Continue PICC line and TPN as per general surgery.  Monitor electrolytes while on TPN.      Code Status:     Code Status Orders  (From admission, onward)           Start     Ordered   10/23/21 1801  Full code  Continuous        10/23/21 1800           Code Status History     Date Active Date Inactive Code Status Order ID Comments User Context   11/17/2020 0432 11/18/2020 1748 Full Code [id number]  Athena Masse, MD  ED   11/05/2020 0604 11/10/2020 1631 Full Code [id number]  Benjamine Sprague, [place] [facility]   08/01/2020 1124 08/05/2020 2228 Full Code [id number]  Collier Bullock, MD ED   01/20/2020 0807 01/21/2020 2051 Full Code [id number]  Ivor Costa, MD ED   11/06/2019 1648 11/11/2019 1749 Full Code [id number]  Max Sane, MD ED   10/29/2019 1436 11/04/2019 1859 Full Code [id number]  Ivor Costa, MD ED   10/14/2019 0253 10/21/2019 1810 Full Code [id number]  Athena Masse, MD ED   09/27/2019 0211 10/02/2019 1736 Full Code [id number]  Athena Masse, MD ED   06/19/2019 1124 06/20/2019 1742 Full Code [id number]  Lang Snow, NP ED   12/22/2018 0844 12/24/2018 1727 Full Code [id number]  Harrie Foreman, MD Inpatient   09/28/2018 0813 10/04/2018 1522 Full Code [id number]  Herbert Pun, MD Inpatient   09/05/2018 0616 09/10/2018 1708 Full Code [id number]  Arta Silence, MD ED   08/12/2018 0540 08/16/2018 2057 Full Code [id number]  Arta Silence, MD ED   07/21/2018 0332 07/23/2018 1718 Full Code [id number]  Derrek Monaco, RN Inpatient   07/18/2018 0640 07/21/2018 0330 DNR [id number]  Harrie Foreman, MD Inpatient   07/05/2018 0808 07/09/2018 1946 DNR [id number]  Harrie Foreman, MD Inpatient   06/30/2018 1733 07/02/2018 1507 DNR [id number]  [name], MD Inpatient   06/30/2018 0019 06/30/2018 1733 Full Code [id number]  Amelia Jo, MD Inpatient   06/04/2018 0113 06/05/2018 1448 Full Code [id number]  Amelia Jo, MD Inpatient   04/21/2018 0633 05/17/2018 2214 Full Code [id number]  Arta Silence, MD Inpatient   02/09/2017 1125 02/11/2017 1706 Full Code [id number]  Henreitta Leber, MD Inpatient      Family Communication: Updated patient's daughter yesterday Disposition Plan: Status is: Inpatient.  Patient on surgical service and likely will be with [place] for a while since she still has an NG tube in and on TPN.  [place]  [facility] [employer]

## 2021-11-02 NOTE — Progress Notes (Signed)
PHARMACY - TOTAL PARENTERAL NUTRITION CONSULT NOTE   Indication: Prolonged ileus  Patient Measurements: Height: 5\' 6"  (167.6 cm) Weight: 69.7 kg (153 lb 10.6 oz) IBW/kg (Calculated) : 59.3 TPN AdjBW (KG): 56.2 Body mass index is 24.8 kg/m.  Assessment: 66 y.o. female with a past medical history of anemia, anxiety, aortic atherosclerosis, aortic valve stenosis, chronic atrial fibrillation, stage IV CKD, breast cancer with history of chemotherapy, history of radiotherapy, COPD, depression, diastolic dysfunction, GERD, hyperlipidemia, hypertension, lymphedema, small bowel obstruction, vitamin D deficiency who presented to the hospital for an colostomy reversal. Pharmacy has been consulted for TPN.   Glucose / Insulin: BG <180, required 2 u SSI Electrolytes: WNL Renal: Scr 1.23>1.46 Hepatic: LFTs WNL Intake / Output; MIVF: +5.6 L GI Imaging: 1/18 abdomen: persistent small bowel ileus GI Surgeries / Procedures: N/A  Central access: to be placed 1/18 TPN start date: 1/18  Nutritional Goals: Goal TPN rate is 55 mL/hr (provides 87.12 g of protein and 1710.32 kcals per day)  RD Assessment: Estimated Needs Total Energy Estimated Needs: 1700-1900kcal/day Total Protein Estimated Needs: 85-95g/day Total Fluid Estimated Needs: 1.2-1.5L/day  Current Nutrition:  NPO  Plan:  Advance TPN to goal rate at 55 mL/hr at 1800 Electrolytes in TPN: Na 72mEq/L, K 43mEq/L, Ca 23mEq/L, Mg 21mEq/L, and Phos 6mmol/L. Cl:Ac 1:1 Add standard MVI and trace elements to TPN Add thiamine 100 mg x 3 days (day 2/3) per dietician rec (via secure chat) Initiate Sensitive q4h SSI and adjust as needed  Discontinue MIVF at 1800 Monitor TPN labs on Mon/Thurs  Cristina Mattern O Deondrea Markos 11/02/2021,7:14 AM

## 2021-11-03 ENCOUNTER — Inpatient Hospital Stay: Payer: Medicare Other

## 2021-11-03 DIAGNOSIS — R710 Precipitous drop in hematocrit: Secondary | ICD-10-CM

## 2021-11-03 LAB — HEMOGLOBIN AND HEMATOCRIT, BLOOD
HCT: 20 % — ABNORMAL LOW (ref 36.0–46.0)
HCT: 24.7 % — ABNORMAL LOW (ref 36.0–46.0)
Hemoglobin: 6.5 g/dL — ABNORMAL LOW (ref 12.0–15.0)
Hemoglobin: 8.1 g/dL — ABNORMAL LOW (ref 12.0–15.0)

## 2021-11-03 LAB — GLUCOSE, CAPILLARY
Glucose-Capillary: 129 mg/dL — ABNORMAL HIGH (ref 70–99)
Glucose-Capillary: 122 mg/dL — ABNORMAL HIGH (ref 70–99)
Glucose-Capillary: 106 mg/dL — ABNORMAL HIGH (ref 70–99)
Glucose-Capillary: 130 mg/dL — ABNORMAL HIGH (ref 70–99)
Glucose-Capillary: 104 mg/dL — ABNORMAL HIGH (ref 70–99)
Glucose-Capillary: 130 mg/dL — ABNORMAL HIGH (ref 70–99)

## 2021-11-03 LAB — COMPREHENSIVE METABOLIC PANEL
ALT: 7 U/L (ref 0–44)
AST: 15 U/L (ref 15–41)
Albumin: 2.5 g/dL — ABNORMAL LOW (ref 3.5–5.0)
Alkaline Phosphatase: 45 U/L (ref 38–126)
Anion gap: 3 — ABNORMAL LOW (ref 5–15)
BUN: 24 mg/dL — ABNORMAL HIGH (ref 8–23)
CO2: 34 mmol/L — ABNORMAL HIGH (ref 22–32)
Calcium: 8.6 mg/dL — ABNORMAL LOW (ref 8.9–10.3)
Chloride: 102 mmol/L (ref 98–111)
Creatinine, Ser: 1.46 mg/dL — ABNORMAL HIGH (ref 0.44–1.00)
GFR, Estimated: 39 mL/min — ABNORMAL LOW (ref >60)
Glucose, Bld: 113 mg/dL — ABNORMAL HIGH (ref 70–99)
Potassium: 4.1 mmol/L (ref 3.5–5.1)
Sodium: 139 mmol/L (ref 135–145)
Total Bilirubin: 1.3 mg/dL — ABNORMAL HIGH (ref 0.3–1.2)
Total Protein: 5.9 g/dL — ABNORMAL LOW (ref 6.5–8.1)

## 2021-11-03 LAB — HEMOGLOBIN: Hemoglobin: 6.4 g/dL — ABNORMAL LOW (ref 12.0–15.0)

## 2021-11-03 LAB — MAGNESIUM: Magnesium: 1.8 mg/dL (ref 1.7–2.4)

## 2021-11-03 LAB — PREPARE RBC (CROSSMATCH)

## 2021-11-03 LAB — PHOSPHORUS: Phosphorus: 4 mg/dL (ref 2.5–4.6)

## 2021-11-03 MED ORDER — ACETAMINOPHEN 325 MG PO TABS
650.0000 mg | ORAL_TABLET | Freq: Once | ORAL | Status: AC
  Administered 2021-11-03: 650 mg via ORAL
  Filled 2021-11-03: qty 2

## 2021-11-03 MED ORDER — SODIUM CHLORIDE 0.9% IV SOLUTION: Freq: Once | INTRAVENOUS | Status: AC

## 2021-11-03 MED ORDER — TRACE MINERALS CU-MN-SE-ZN 300-55-60-3000 MCG/ML IV SOLN
INTRAVENOUS | Status: AC
  Filled 2021-11-03: qty 580.8

## 2021-11-03 MED ORDER — SODIUM CHLORIDE 0.9% IV SOLUTION: Freq: Once | INTRAVENOUS | Status: DC

## 2021-11-03 MED ORDER — METOCLOPRAMIDE HCL 5 MG/ML IJ SOLN
5.0000 mg | Freq: Four times a day (QID) | INTRAMUSCULAR | Status: DC
  Administered 2021-11-03 – 2021-11-09 (×22): 5 mg via INTRAVENOUS
  Filled 2021-11-03 (×22): qty 2

## 2021-11-03 MED ORDER — FUROSEMIDE 10 MG/ML IJ SOLN
40.0000 mg | Freq: Once | INTRAMUSCULAR | Status: AC
  Administered 2021-11-03: 40 mg via INTRAVENOUS
  Filled 2021-11-03: qty 4

## 2021-11-03 MED ORDER — FUROSEMIDE 10 MG/ML IJ SOLN
20.0000 mg | Freq: Once | INTRAMUSCULAR | Status: AC
  Administered 2021-11-03: 20 mg via INTRAVENOUS
  Filled 2021-11-03: qty 4

## 2021-11-03 NOTE — Progress Notes (Signed)
PHARMACY - TOTAL PARENTERAL NUTRITION CONSULT NOTE   Indication: Prolonged ileus  Patient Measurements: Height: 5\' 6"  (167.6 cm) Weight: 62.7 kg (138 lb 3.7 oz) IBW/kg (Calculated) : 59.3 TPN AdjBW (KG): 56.2 Body mass index is 22.31 kg/m.  Assessment: 66 y.o. female with a past medical history of anemia, anxiety, aortic atherosclerosis, aortic valve stenosis, chronic atrial fibrillation, stage IV CKD, breast cancer with history of chemotherapy, history of radiotherapy, COPD, depression, diastolic dysfunction, GERD, hyperlipidemia, hypertension, lymphedema, small bowel obstruction, vitamin D deficiency who presented to the hospital for an colostomy reversal. Pharmacy has been consulted for TPN.   Glucose / Insulin: BG <180, required 8 u SSI Electrolytes: WNL Renal: Scr 1.23>1.46 Hepatic: LFTs WNL Intake / Output; MIVF: +5.6 L GI Imaging: 1/18 abdomen: persistent small bowel ileus GI Surgeries / Procedures: N/A  Central access: to be placed 1/18 TPN start date: 1/18  Nutritional Goals: Goal TPN rate is 55 mL/hr (provides 87.12 g of protein and 1710.32 kcals per day)  RD Assessment: Estimated Needs Total Energy Estimated Needs: 1700-1900kcal/day Total Protein Estimated Needs: 85-95g/day Total Fluid Estimated Needs: 1.2-1.5L/day  Current Nutrition:  NPO  Plan:  Advance TPN to goal rate at 55 mL/hr at 1800 Electrolytes in TPN: Na 42mEq/L, K 26mEq/L, Ca 77mEq/L, Mg 40mEq/L, and Phos 74mmol/L. Cl:Ac 1:1 Add standard MVI and trace elements to TPN Add thiamine 100 mg x 3 days (day 3/3) per dietician rec (via secure chat) Initiate Sensitive q4h SSI and adjust as needed  Monitor TPN labs on Mon/Thurs  Elyan Vanwieren O Khalidah Herbold 11/03/2021,7:30 AM

## 2021-11-03 NOTE — Progress Notes (Signed)
Physical Therapy Treatment Patient Details Name: Danielle [name] MRN: [id number] DOB: December 05, 1955 Today's Date: 11/03/2021   History of Present Illness 66 y/o female s/p iliostomy take down 1/09.    PT Comments    Pt is making gradual progress and is limited by low Hgb this date at 6.5 and pending blood transfusion. Requested to only perform supine there-ex as RN hanging blood during session. Able to tolerate well. O2 at 93% on 2 L with exertion and HR at 91bpm. Will continue to progress as able.   Recommendations for follow up therapy are one component of a multi-disciplinary discharge planning process, led by the attending physician.  Recommendations may be updated based on patient status, additional functional criteria and insurance authorization.  Follow Up Recommendations  Home health PT     Assistance Recommended at Discharge Frequent or constant Supervision/Assistance  Patient can return home with the following A lot of help with walking and/or transfers;A lot of help with bathing/dressing/bathroom   Equipment Recommendations  Rolling walker (2 wheels)    Recommendations for Other Services       Precautions / Restrictions Restrictions Weight Bearing Restrictions: No     Mobility  Bed Mobility               General bed mobility comments: not performed as RN hanging blood transfusion    Transfers                        Ambulation/Gait                   Stairs             Wheelchair Mobility    Modified Rankin (Stroke Patients Only)       Balance                                            Cognition Arousal/Alertness: Awake/alert Behavior During Therapy: WFL for tasks assessed/performed Overall Cognitive Status: Within Functional Limits for tasks assessed                                          Exercises Other Exercises Other Exercises: supine ther-ex performed on B LE including AP,  quad sets, hip abd/add, heel slides, and SLRs. 15 reps with safe technique and supervision    General Comments        Pertinent Vitals/Pain Pain Assessment Pain Assessment: No/denies pain    Home Living                          Prior Function            PT Goals (current goals can now be found in the care plan section) Acute Rehab PT Goals Patient Stated Goal: go home PT Goal Formulation: With patient Time For Goal Achievement: 11/07/21 Potential to Achieve Goals: Fair Progress towards PT goals: Progressing toward goals    Frequency    Min 2X/week      PT Plan Current plan remains appropriate    Co-evaluation              AM-PAC PT "6 Clicks" Mobility   Outcome Measure  Help needed turning from your back to  your side while in a flat bed without using bedrails?: None Help needed moving from lying on your back to sitting on the side of a flat bed without using bedrails?: None Help needed moving to and from a bed to a chair (including a wheelchair)?: A Little Help needed standing up from a chair using your arms (e.g., wheelchair or bedside chair)?: A Little Help needed to walk in hospital room?: A Little Help needed climbing 3-5 steps with a railing? : Total 6 Click Score: 18    End of Session Equipment Utilized During Treatment: Oxygen Activity Tolerance: Patient tolerated treatment well Patient left: in bed;with bed alarm set;with nursing/sitter in room Nurse Communication: Mobility status PT Visit Diagnosis: Muscle weakness (generalized) (M62.81);Difficulty in walking, not elsewhere classified (R26.2)     Time: 9643-8381 PT Time Calculation (min) (ACUTE ONLY): 10 min  Charges:  $Therapeutic Exercise: 8-22 mins                     Greggory Stallion, PT, DPT, GCS [phone]    [name] 11/03/2021, 11:44 AM

## 2021-11-03 NOTE — Progress Notes (Signed)
Patient ID: Danielle [name], female   DOB: [birth year], 66 y.o.   MRN: [id number]     [facility] Day(s): 11.   Interval History: Patient seen and examined, no acute events or new complaints overnight. Patient reports feeling better this morning.  She still has some bloating but is slowly feeling more comfortable.  She has some bowel movement but still feeling distended and bloated.  Vital signs in last 24 hours: [min-max] current  Temp:  [98.1 F (36.7 C)-98.8 F (37.1 C)] 98.2 F (36.8 C) (01/20 1035) Pulse Rate:  [77-91] 78 (01/20 1035) Resp:  [16-18] 18 (01/20 1035) BP: (95-134)/(61-92) 106/71 (01/20 1035) SpO2:  [91 %-100 %] 100 % (01/20 1035) Weight:  [62.7 kg] 62.7 kg (01/20 0500)     Height: 5\' 6"  (167.6 cm) Weight: 62.7 kg BMI (Calculated): 22.32   Physical Exam:  Constitutional: alert, cooperative and no distress  Respiratory: breathing non-labored at rest  Cardiovascular: regular rate and sinus rhythm  Gastrointestinal: soft, non-tender, and mild-distended  Labs:  CBC Latest Ref Rng & Units 11/03/2021 11/03/2021 11/01/2021  WBC 4.0 - 10.5 K/uL - - 9.4  Hemoglobin 12.0 - 15.0 g/dL 6.5(L) 6.4(L) 7.6(L)  Hematocrit 36.0 - 46.0 % 20.0(L) - 23.8(L)  Platelets 150 - 400 K/uL - - 346   CMP Latest Ref Rng & Units 11/03/2021 11/02/2021 11/01/2021  Glucose 70 - 99 mg/dL 113(H) 115(H) 106(H)  BUN 8 - 23 mg/dL 24(H) 19 14  Creatinine 0.44 - 1.00 mg/dL 1.46(H) 1.46(H) 1.23(H)  Sodium 135 - 145 mmol/L 139 137 135  Potassium 3.5 - 5.1 mmol/L 4.1 3.9 4.0  Chloride 98 - 111 mmol/L 102 96(L) 94(L)  CO2 22 - 32 mmol/L 34(H) 36(H) 34(H)  Calcium 8.9 - 10.3 mg/dL 8.6(L) 8.6(L) 8.4(L)  Total Protein 6.5 - 8.1 g/dL 5.9(L) 5.9(L) -  Total Bilirubin 0.3 - 1.2 mg/dL 1.3(H) 1.5(H) -  Alkaline Phos 38 - 126 U/L 45 49 -  AST 15 - 41 U/L 15 15 -  ALT 0 - 44 U/L 7 9 -    Imaging studies: Abdominal x-ray shows worsening small bowel dilation consistent with ileus.  I personally  evaluated the images.   Assessment/Plan:  66 y.o. female with history of an ileostomy and mucous fistula 11 Day Post-Op s/p ileostomy reversal, complicated by pertinent comorbidities including COPD, atrial fibrillation on anticoagulation, hypertension, chronic kidney disease stage III.   Status post ileostomy reversal -Patient with postoperative ileus.  NGT in place.  -Increase Reglan frequency to 5 mg every 6 hours -Abdominal x-ray today shows persistent small bowel dilation consistent with ileus. -We will continue with conservative management with NGT decompression, keeping electrolytes within normal limits.  Continue with TPN. -Hemoglobin today 6.5.  Will transfuse 1 unit of PRBC.  There is no sign of bleeding.  NGT or stool without blood.   Chronic kidney disease stage III -Acute kidney injury after surgery.  This seem to be resolved.  -Appreciate hospitalist and nephrology evaluation and recommendations.   A. Fib -Intermittent rate control -Currently on therapeutic anticoagulation -Appreciate hospitalist evaluation and management   Encourage patient to get out of bed.  Will continue pain management.   Arnold Long, MD

## 2021-11-03 NOTE — Progress Notes (Signed)
Patient ID: Danielle [name], female   DOB: 1956/06/01, 66 y.o.   MRN: [id number] [facility] PROGRESS NOTE  VIRIGINIA AMENDOLA QPY:[id number] DOB: 05/09/1956 DOA: 10/23/2021 PCP: [facility]  HPI/Subjective: Patient still having some abdominal soreness.  Complains that her nose is sore with the NG tube.  Having some bowel movement.  Currently on TPN.  This morning's hemoglobin dropped down to 6.4.  Objective: Vitals:   11/03/21 1035 11/03/21 1304  BP: 106/71 (!) 146/104  Pulse: 78 89  Resp: 18 18  Temp: 98.2 F (36.8 C) 98 F (36.7 C)  SpO2: 100% 100%    Intake/Output Summary (Last 24 hours) at 11/03/2021 1405 Last data filed at 11/03/2021 1304 Gross per 24 hour  Intake 326 ml  Output 100 ml  Net 226 ml   Filed Weights   11/01/21 1515 11/02/21 0500 11/03/21 0500  Weight: 66.2 kg 69.7 kg 62.7 kg    ROS: Review of Systems  Respiratory:  Positive for cough and shortness of breath.   Cardiovascular:  Negative for chest pain.  Gastrointestinal:  Positive for abdominal pain. Negative for nausea and vomiting.  Exam: Physical Exam HENT:     Head: Normocephalic.     Mouth/Throat:     Pharynx: No oropharyngeal exudate.  Eyes:     General: Lids are normal.     Conjunctiva/sclera: Conjunctivae normal.  Cardiovascular:     Rate and Rhythm: Normal rate. Rhythm irregularly irregular.     Heart sounds: S1 normal and S2 normal. Murmur heard.  Systolic murmur is present with a grade of 2/6.  Pulmonary:     Breath sounds: Examination of the right-lower field reveals decreased breath sounds and rales. Examination of the left-lower field reveals decreased breath sounds and rales. Decreased breath sounds and rales present. No wheezing or rhonchi.  Abdominal:     Palpations: Abdomen is soft.     Tenderness: There is no abdominal tenderness.  Musculoskeletal:     Right lower leg: No swelling.     Left lower leg: No swelling.  Skin:    General: Skin is warm.      Findings: No rash.  Neurological:     Mental Status: She is alert and oriented to person, place, and time.      Scheduled Meds:  sodium chloride   Intravenous Once   apixaban  2.5 mg Oral BID   bisacodyl  10 mg Rectal Daily   carvedilol  3.125 mg Oral BID WC   Chlorhexidine Gluconate Cloth  6 each Topical Daily   citalopram  40 mg Oral BH-q7a   diltiazem  240 mg Oral Daily   fluticasone furoate-vilanterol  1 puff Inhalation Daily   And   umeclidinium bromide  1 puff Inhalation Daily   gabapentin  300 mg Oral BID   insulin aspart  0-9 Units Subcutaneous Q4H   metoCLOPramide (REGLAN) injection  5 mg Intravenous Q6H   pantoprazole (PROTONIX) IV  40 mg Intravenous QHS   sodium chloride flush  10-40 mL Intracatheter Q12H   Continuous Infusions:  TPN ADULT (ION) 55 mL/hr at 11/02/21 1741   TPN ADULT (ION)      Assessment/Plan:  Drop in hemoglobin.  Anemia of chronic disease.  Hemoglobin dropped down to 6.4 today.  Benefits and risks of blood transfusion explained to the patient.  Transfuse 1 unit of packed red blood cells today.  Lasix before and after blood. Paroxysmal atrial fibrillation.  Continue Coreg and Cardizem CD.  Continue  Eliquis for anticoagulation. Acute hypoxic respiratory failure.  With ambulation 2 days ago did drop her saturations into the low 80s.  Continue to monitor if we can get her off oxygen. Acute on chronic diastolic congestive heart failure.  We will give Lasix before and after blood today.  Continue to monitor. Acute kidney injury on chronic kidney disease stage IIIb.  Creatinine peaked at 3.25 on 10/25/2021.  Creatinine 1.46 today. Postoperative ileus.  Patient had elective ileostomy reversal.  Continue TPN and monitor electrolytes while on TPN. Hyponatremia on admission.  Sodium in the normal range. Weakness.  Continue working with physical therapy.  Patient does not want to go to rehab and wants to go home with home health.      Code Status:      Code Status Orders  (From admission, onward)           Start     Ordered   10/23/21 1801  Full code  Continuous        10/23/21 1800           Code Status History     Date Active Date Inactive Code Status Order ID Comments User Context   11/17/2020 0432 11/18/2020 1748 Full Code [id number]  Athena Masse, MD ED   11/05/2020 0604 11/10/2020 1631 Full Code [id number]  Benjamine Sprague, DO ED   08/01/2020 1124 08/05/2020 2228 Full Code [id number]  Collier Bullock, MD ED   01/20/2020 0807 01/21/2020 2051 Full Code [id number]  Ivor Costa, MD ED   11/06/2019 1648 11/11/2019 1749 Full Code [id number]  Max Sane, MD ED   10/29/2019 1436 11/04/2019 1859 Full Code [id number]  Ivor Costa, MD ED   10/14/2019 0253 10/21/2019 1810 Full Code [id number]  Athena Masse, MD ED   09/27/2019 0211 10/02/2019 1736 Full Code [id number]  Athena Masse, MD ED   06/19/2019 1124 06/20/2019 1742 Full Code [id number]  Lang Snow, NP ED   12/22/2018 0844 12/24/2018 1727 Full Code [id number]  Harrie Foreman, MD Inpatient   09/28/2018 0813 10/04/2018 1522 Full Code [id number]  Herbert Pun, MD Inpatient   09/05/2018 0616 09/10/2018 1708 Full Code [id number]  Arta Silence, MD ED   08/12/2018 0540 08/16/2018 2057 Full Code [id number]  Arta Silence, MD ED   07/21/2018 0332 07/23/2018 1718 Full Code [id number]  Derrek Monaco, RN Inpatient   07/18/2018 0640 07/21/2018 0330 DNR [id number]  Harrie Foreman, MD Inpatient   07/05/2018 0808 07/09/2018 1946 DNR [id number]  Harrie Foreman, MD Inpatient   06/30/2018 1733 07/02/2018 1507 DNR [id number]  [name], MD Inpatient   06/30/2018 0019 06/30/2018 1733 Full Code [id number]  Amelia Jo, MD Inpatient   06/04/2018 0113 06/05/2018 1448 Full Code [id number]  Amelia Jo, MD Inpatient   04/21/2018 0633 05/17/2018 2214 Full Code [id number]  Arta Silence, MD Inpatient   02/09/2017 1125 02/11/2017 1706 Full Code [id number]  Henreitta Leber, MD  Inpatient      Family Communication: Spoke with daughter on the phone Disposition Plan: Status is: Inpatient  Case discussed with general surgery  [place]  [facility]

## 2021-11-03 NOTE — TOC Progression Note (Signed)
Transition of Care [facility]) - Progression Note    Patient Details  Name: Danielle [name] MRN: [id number] Date of Birth: Dec 12, 1955  Transition of Care [facility]) CM/SW Contact  Beverly Sessions, RN Phone Number: 11/03/2021, 10:02 AM  Clinical Narrative:     Patient still with NG tube and TPN Therapy recommendations have been upgraded from SNF to Home health.  At this time patient still declines home health   Expected Discharge Plan: Home/Self Care Barriers to Discharge: Continued Medical Work up  Expected Discharge Plan and Services Expected Discharge Plan: Home/Self Care                                               Social Determinants of Health (SDOH) Interventions    Readmission Risk Interventions Readmission Risk Prevention Plan 10/16/2019 09/28/2019 06/20/2019  Transportation Screening Complete Complete Complete  Medication Review [employer] photographer) Referral to Pharmacy Complete Complete  [name] or Home Care Consult Complete Patient refused Complete  SW Recovery Care/Counseling Consult Complete Complete -  Palliative Care Screening Not Applicable Not Applicable Not [place] Not Applicable Not Applicable -  Some recent data might be hidden

## 2021-11-04 LAB — GLUCOSE, CAPILLARY
Glucose-Capillary: 117 mg/dL — ABNORMAL HIGH (ref 70–99)
Glucose-Capillary: 120 mg/dL — ABNORMAL HIGH (ref 70–99)
Glucose-Capillary: 121 mg/dL — ABNORMAL HIGH (ref 70–99)
Glucose-Capillary: 121 mg/dL — ABNORMAL HIGH (ref 70–99)
Glucose-Capillary: 123 mg/dL — ABNORMAL HIGH (ref 70–99)
Glucose-Capillary: 126 mg/dL — ABNORMAL HIGH (ref 70–99)
Glucose-Capillary: 128 mg/dL — ABNORMAL HIGH (ref 70–99)

## 2021-11-04 LAB — COMPREHENSIVE METABOLIC PANEL
ALT: 8 U/L (ref 0–44)
AST: 14 U/L — ABNORMAL LOW (ref 15–41)
Albumin: 2.6 g/dL — ABNORMAL LOW (ref 3.5–5.0)
Alkaline Phosphatase: 44 U/L (ref 38–126)
Anion gap: 8 (ref 5–15)
BUN: 35 mg/dL — ABNORMAL HIGH (ref 8–23)
CO2: 34 mmol/L — ABNORMAL HIGH (ref 22–32)
Calcium: 8.7 mg/dL — ABNORMAL LOW (ref 8.9–10.3)
Chloride: 97 mmol/L — ABNORMAL LOW (ref 98–111)
Creatinine, Ser: 1.62 mg/dL — ABNORMAL HIGH (ref 0.44–1.00)
GFR, Estimated: 35 mL/min — ABNORMAL LOW (ref >60)
Glucose, Bld: 110 mg/dL — ABNORMAL HIGH (ref 70–99)
Potassium: 4.1 mmol/L (ref 3.5–5.1)
Sodium: 139 mmol/L (ref 135–145)
Total Bilirubin: 1.1 mg/dL (ref 0.3–1.2)
Total Protein: 5.9 g/dL — ABNORMAL LOW (ref 6.5–8.1)

## 2021-11-04 LAB — TYPE AND SCREEN
ABO/RH(D): O POS
Antibody Screen: NEGATIVE
Unit division: 0

## 2021-11-04 LAB — PHOSPHORUS: Phosphorus: 5.7 mg/dL — ABNORMAL HIGH (ref 2.5–4.6)

## 2021-11-04 LAB — BPAM RBC
Blood Product Expiration Date: 202302102359
ISSUE DATE / TIME: 202301201007
Unit Type and Rh: 5100

## 2021-11-04 LAB — MAGNESIUM: Magnesium: 1.8 mg/dL (ref 1.7–2.4)

## 2021-11-04 MED ORDER — TRACE MINERALS CU-MN-SE-ZN 300-55-60-3000 MCG/ML IV SOLN
INTRAVENOUS | Status: AC
  Filled 2021-11-04: qty 580.8

## 2021-11-04 NOTE — Progress Notes (Signed)
Patient ID: Danielle [name], female   DOB: April 04, 1956, 66 y.o.   MRN: [id number]     [facility] Day(s): 12.   Interval History: Patient seen and examined, no acute events or new complaints overnight. Patient reports feeling sore in the abdomen.  She endorses that she has been able to ambulate.  Denies passing gas  Vital signs in last 24 hours: [min-max] current  Temp:  [98 F (36.7 C)-98.6 F (37 C)] 98 F (36.7 C) (01/21 0746) Pulse Rate:  [78-89] 79 (01/21 0746) Resp:  [17-18] 17 (01/21 0746) BP: (102-146)/(67-104) 103/70 (01/21 0746) SpO2:  [97 %-100 %] 98 % (01/21 0746) Weight:  [63.3 kg] 63.3 kg (01/21 0444)     Height: 5\' 6"  (167.6 cm) Weight: 63.3 kg BMI (Calculated): 22.53   Physical Exam:  Constitutional: alert, cooperative and no distress  Respiratory: breathing non-labored at rest  Cardiovascular: regular rate and sinus rhythm  Gastrointestinal: soft, non-tender, and mild distended  Labs:  CBC Latest Ref Rng & Units 11/03/2021 11/03/2021 11/03/2021  WBC 4.0 - 10.5 K/uL - - -  Hemoglobin 12.0 - 15.0 g/dL 8.1(L) 6.5(L) 6.4(L)  Hematocrit 36.0 - 46.0 % 24.7(L) 20.0(L) -  Platelets 150 - 400 K/uL - - -   CMP Latest Ref Rng & Units 11/04/2021 11/03/2021 11/02/2021  Glucose 70 - 99 mg/dL 110(H) 113(H) 115(H)  BUN 8 - 23 mg/dL 35(H) 24(H) 19  Creatinine 0.44 - 1.00 mg/dL 1.62(H) 1.46(H) 1.46(H)  Sodium 135 - 145 mmol/L 139 139 137  Potassium 3.5 - 5.1 mmol/L 4.1 4.1 3.9  Chloride 98 - 111 mmol/L 97(L) 102 96(L)  CO2 22 - 32 mmol/L 34(H) 34(H) 36(H)  Calcium 8.9 - 10.3 mg/dL 8.7(L) 8.6(L) 8.6(L)  Total Protein 6.5 - 8.1 g/dL 5.9(L) 5.9(L) 5.9(L)  Total Bilirubin 0.3 - 1.2 mg/dL 1.1 1.3(H) 1.5(H)  Alkaline Phos 38 - 126 U/L 44 45 49  AST 15 - 41 U/L 14(L) 15 15  ALT 0 - 44 U/L 8 7 9     Imaging studies: No new pertinent imaging studies   Assessment/Plan:  66 y.o. female with history of an ileostomy and mucous fistula 11 Day Post-Op s/p ileostomy  reversal, complicated by pertinent comorbidities including COPD, atrial fibrillation on anticoagulation, hypertension, chronic kidney disease stage III.   Status post ileostomy reversal -Patient with postoperative ileus.  NGT in place.  -Reglan 5 mg every 6 hours -Getting Dulcolax suppositories -We will continue with conservative management with NGT decompression, keeping electrolytes within normal limits.  Continue with TPN. -Hemoglobin posttransfusion 8.1   Chronic kidney disease stage III -Acute kidney injury after surgery.  This seem to be resolved.  -Appreciate hospitalist and nephrology evaluation and recommendations.   A. Fib -Intermittent rate control -Currently on therapeutic anticoagulation -Appreciate hospitalist evaluation and management   Encourage patient to get out of bed.  Will continue pain management.   Arnold Long, MD

## 2021-11-04 NOTE — Progress Notes (Signed)
Patient ID: Danielle [name], female   DOB: 31-Dec-1955, 66 y.o.   MRN: [id number] [facility] PROGRESS NOTE  Danielle [name] EHM:[id number] DOB: 24-Sep-1956 DOA: 10/23/2021 PCP: [facility] [place]  HPI/Subjective: Patient seen this morning and still has an NG tube in.  She has not had a bowel movement since yesterday.  Not passing too much gas.  Still having abdominal pain.  Initially admitted with elective ileostomy reversal.  Objective: Vitals:   11/04/21 0659 11/04/21 0746  BP: 104/67 103/70  Pulse: 79 79  Resp: 18 17  Temp: 98.6 F (37 C) 98 F (36.7 C)  SpO2: 100% 98%    Intake/Output Summary (Last 24 hours) at 11/04/2021 1415 Last data filed at 11/04/2021 0544 Gross per 24 hour  Intake 587.5 ml  Output 1300 ml  Net -712.5 ml   Filed Weights   11/02/21 0500 11/03/21 0500 11/04/21 0444  Weight: 69.7 kg 62.7 kg 63.3 kg    ROS: Review of Systems  Respiratory:  Positive for shortness of breath.   Cardiovascular:  Negative for chest pain.  Gastrointestinal:  Positive for abdominal pain. Negative for nausea and vomiting.  Exam: Physical Exam HENT:     Head: Normocephalic.     Mouth/Throat:     Pharynx: No oropharyngeal exudate.  Eyes:     General: Lids are normal.     Conjunctiva/sclera: Conjunctivae normal.  Cardiovascular:     Rate and Rhythm: Normal rate and regular rhythm.     Heart sounds: Normal heart sounds, S1 normal and S2 normal.  Pulmonary:     Breath sounds: No decreased breath sounds, wheezing, rhonchi or rales.  Abdominal:     Palpations: Abdomen is soft.     Tenderness: There is generalized abdominal tenderness.  Musculoskeletal:     Right lower leg: No swelling.     Left lower leg: No swelling.  Skin:    General: Skin is warm.     Findings: No rash.  Neurological:     Mental Status: She is alert and oriented to person, place, and time.      Scheduled Meds:  sodium chloride   Intravenous Once   apixaban  2.5 mg Oral BID    bisacodyl  10 mg Rectal Daily   carvedilol  3.125 mg Oral BID WC   Chlorhexidine Gluconate Cloth  6 each Topical Daily   citalopram  40 mg Oral BH-q7a   diltiazem  240 mg Oral Daily   fluticasone furoate-vilanterol  1 puff Inhalation Daily   And   umeclidinium bromide  1 puff Inhalation Daily   gabapentin  300 mg Oral BID   insulin aspart  0-9 Units Subcutaneous Q4H   metoCLOPramide (REGLAN) injection  5 mg Intravenous Q6H   pantoprazole (PROTONIX) IV  40 mg Intravenous QHS   sodium chloride flush  10-40 mL Intracatheter Q12H   Continuous Infusions:  TPN ADULT (ION) 55 mL/hr at 11/04/21 0417   TPN ADULT (ION)      Assessment/Plan:  Paroxysmal atrial fibrillation.  Continue low-dose Coreg and Cardizem CD.  On Eliquis for anticoagulation to prevent stroke.   Anemia of chronic disease with a drop in hemoglobin yesterday.  Responded to 1 unit of packed red blood cells.  Hemoglobin went from 6.4 up to 8.1.  Recheck tomorrow. Acute hypoxic respiratory failure.  3 days ago did drop her saturations in the mid 80s with ambulation.  Still on oxygen at this point.  Continue to see if we can taper  off the oxygen. Acute on chronic diastolic congestive heart failure.  Patient has received intermittent Lasix during the hospital course.  Received Lasix yesterday with blood transfusion. Acute kidney injury on chronic kidney disease stage IIIb.  Creatinine peaked at 3.25 on 10/25/2021.  Creatinine up to 1.62 today.  Recheck BMP tomorrow. Postoperative ileus.  Patient had elective ileostomy reversal.  Continue TPN.  Patient still has NG tube in.  General surgery following. Weakness.  Continue working with physical therapy.  Patient wants to go home and not to rehab. Hyponatremia on admission.  This is resolved sodium now in the normal range.        Code Status:     Code Status Orders  (From admission, onward)           Start     Ordered   10/23/21 1801  Full code  Continuous        10/23/21  1800           Code Status History     Date Active Date Inactive Code Status Order ID Comments User Context   11/17/2020 0432 11/18/2020 1748 Full Code [id number]  Athena Masse, MD ED   11/05/2020 0604 11/10/2020 1631 Full Code [id number]  Benjamine Sprague, [place] ED   08/01/2020 1124 08/05/2020 2228 Full Code [id number]  Collier Bullock, MD ED   01/20/2020 0807 01/21/2020 2051 Full Code [id number]  Ivor Costa, MD ED   11/06/2019 1648 11/11/2019 1749 Full Code [id number]  Max Sane, MD ED   10/29/2019 1436 11/04/2019 1859 Full Code [id number]  Ivor Costa, MD ED   10/14/2019 0253 10/21/2019 1810 Full Code [id number]  Athena Masse, MD ED   09/27/2019 0211 10/02/2019 1736 Full Code [id number]  Athena Masse, MD ED   06/19/2019 1124 06/20/2019 1742 Full Code [id number]  Lang Snow, NP ED   12/22/2018 0844 12/24/2018 1727 Full Code [id number]  Harrie Foreman, MD Inpatient   09/28/2018 0813 10/04/2018 1522 Full Code [id number]  Herbert Pun, MD Inpatient   09/05/2018 0616 09/10/2018 1708 Full Code [id number]  Arta Silence, MD ED   08/12/2018 0540 08/16/2018 2057 Full Code [id number]  Arta Silence, MD ED   07/21/2018 0332 07/23/2018 1718 Full Code [id number]  Derrek Monaco, RN Inpatient   07/18/2018 0640 07/21/2018 0330 DNR [id number]  Harrie Foreman, MD Inpatient   07/05/2018 0808 07/09/2018 1946 DNR [id number]  Harrie Foreman, MD Inpatient   06/30/2018 1733 07/02/2018 1507 DNR [id number]  [name], MD Inpatient   06/30/2018 0019 06/30/2018 1733 Full Code [id number]  Amelia Jo, MD Inpatient   06/04/2018 0113 06/05/2018 1448 Full Code [id number]  Amelia Jo, MD Inpatient   04/21/2018 0633 05/17/2018 2214 Full Code [id number]  Arta Silence, MD Inpatient   02/09/2017 1125 02/11/2017 1706 Full Code [id number]  Henreitta Leber, MD Inpatient      Family Communication: Updated patient's daughter yesterday Disposition Plan: Status is: [place]  [facility]

## 2021-11-04 NOTE — Progress Notes (Signed)
PHARMACY - TOTAL PARENTERAL NUTRITION CONSULT NOTE   Indication: Prolonged ileus  Patient Measurements: Height: 5\' 6"  (167.6 cm) Weight: 63.3 kg (139 lb 8.8 oz) IBW/kg (Calculated) : 59.3 TPN AdjBW (KG): 56.2 Body mass index is 22.52 kg/m.  Assessment: 66 y.o. female with a past medical history of anemia, anxiety, aortic atherosclerosis, aortic valve stenosis, chronic atrial fibrillation, stage IV CKD, breast cancer with history of chemotherapy, history of radiotherapy, COPD, depression, diastolic dysfunction, GERD, hyperlipidemia, hypertension, lymphedema, small bowel obstruction, vitamin D deficiency who presented to the hospital for an colostomy reversal. Pharmacy has been consulted for TPN.   Glucose / Insulin: BG <180, 24-hr requirement 5 u SSI Electrolytes: WNL Renal: Scr trending up Hepatic: LFTs WNL Intake / Output; MIVF: +2.3 L GI Imaging: 1/18 abdomen: persistent small bowel ileus GI Surgeries / Procedures: N/A  Central access: 1/18 TPN start date: 1/18  Nutritional Goals: Goal TPN rate is 55 mL/hr (provides 87.12 g of protein and 1710.32 kcals per day)  RD Assessment: Estimated Needs Total Energy Estimated Needs: 1700-1900kcal/day Total Protein Estimated Needs: 85-95g/day Total Fluid Estimated Needs: 1.2-1.5L/day  Current Nutrition:  NPO  Plan:  Continue TPN at goal rate 55 mL/hr Electrolytes in TPN: Na 50 mEq/L, K 50 mEq/L, Ca 5 mEq/L, Mg 5 mEq/L, and Phos 5 mmol/L. Cl:Ac 1.66:1 Phos trending up over several days, will decrease today but may need to remove depending on continued trend Add standard MVI and trace elements to TPN Continue Sensitive q4h SSI and adjust as needed  Monitor TPN labs on Mon/Thurs at minimum  Tawnya Crook, PharmD, BCPS Clinical Pharmacist 11/04/2021 9:26 AM

## 2021-11-05 ENCOUNTER — Inpatient Hospital Stay: Payer: Medicare Other

## 2021-11-05 DIAGNOSIS — R509 Fever, unspecified: Secondary | ICD-10-CM

## 2021-11-05 DIAGNOSIS — R531 Weakness: Secondary | ICD-10-CM

## 2021-11-05 LAB — URINALYSIS, ROUTINE W REFLEX MICROSCOPIC
Bacteria, UA: NONE SEEN
Bilirubin Urine: NEGATIVE
Glucose, UA: NEGATIVE mg/dL
Ketones, ur: NEGATIVE mg/dL
Nitrite: NEGATIVE
Protein, ur: 30 mg/dL — AB
Specific Gravity, Urine: 1.015 (ref 1.005–1.030)
Squamous Epithelial / HPF: NONE SEEN (ref 0–5)
WBC, UA: NONE SEEN WBC/hpf (ref 0–5)
pH: 6 (ref 5.0–8.0)

## 2021-11-05 LAB — PHOSPHORUS: Phosphorus: 3.7 mg/dL (ref 2.5–4.6)

## 2021-11-05 LAB — CBC
HCT: 22.5 % — ABNORMAL LOW (ref 36.0–46.0)
Hemoglobin: 7.5 g/dL — ABNORMAL LOW (ref 12.0–15.0)
MCH: 31.8 pg (ref 26.0–34.0)
MCHC: 33.3 g/dL (ref 30.0–36.0)
MCV: 95.3 fL (ref 80.0–100.0)
Platelets: 314 10*3/uL (ref 150–400)
RBC: 2.36 MIL/uL — ABNORMAL LOW (ref 3.87–5.11)
RDW: 16.8 % — ABNORMAL HIGH (ref 11.5–15.5)
WBC: 13.5 10*3/uL — ABNORMAL HIGH (ref 4.0–10.5)
nRBC: 0 % (ref 0.0–0.2)

## 2021-11-05 LAB — PROCALCITONIN: Procalcitonin: <0.1 ng/mL

## 2021-11-05 LAB — GLUCOSE, CAPILLARY
Glucose-Capillary: 140 mg/dL — ABNORMAL HIGH (ref 70–99)
Glucose-Capillary: 117 mg/dL — ABNORMAL HIGH (ref 70–99)
Glucose-Capillary: 129 mg/dL — ABNORMAL HIGH (ref 70–99)
Glucose-Capillary: 91 mg/dL (ref 70–99)
Glucose-Capillary: 136 mg/dL — ABNORMAL HIGH (ref 70–99)
Glucose-Capillary: 149 mg/dL — ABNORMAL HIGH (ref 70–99)

## 2021-11-05 LAB — LIPASE, BLOOD: Lipase: 22 U/L (ref 11–51)

## 2021-11-05 LAB — BASIC METABOLIC PANEL
Anion gap: 11 (ref 5–15)
BUN: 50 mg/dL — ABNORMAL HIGH (ref 8–23)
CO2: 29 mmol/L (ref 22–32)
Calcium: 8.5 mg/dL — ABNORMAL LOW (ref 8.9–10.3)
Chloride: 97 mmol/L — ABNORMAL LOW (ref 98–111)
Creatinine, Ser: 1.73 mg/dL — ABNORMAL HIGH (ref 0.44–1.00)
GFR, Estimated: 32 mL/min — ABNORMAL LOW (ref >60)
Glucose, Bld: 128 mg/dL — ABNORMAL HIGH (ref 70–99)
Potassium: 4.6 mmol/L (ref 3.5–5.1)
Sodium: 137 mmol/L (ref 135–145)

## 2021-11-05 LAB — LACTIC ACID, PLASMA
Lactic Acid, Venous: 0.9 mmol/L (ref 0.5–1.9)
Lactic Acid, Venous: 0.8 mmol/L (ref 0.5–1.9)

## 2021-11-05 LAB — MRSA NEXT GEN BY PCR, NASAL: MRSA by PCR Next Gen: NOT DETECTED

## 2021-11-05 MED ORDER — VANCOMYCIN HCL 1250 MG/250ML IV SOLN: 1250.0000 mg | INTRAVENOUS | Status: DC

## 2021-11-05 MED ORDER — ACETAMINOPHEN 10 MG/ML IV SOLN
1000.0000 mg | Freq: Once | INTRAVENOUS | Status: AC
  Administered 2021-11-05: 1000 mg via INTRAVENOUS
  Filled 2021-11-05: qty 100

## 2021-11-05 MED ORDER — TRACE MINERALS CU-MN-SE-ZN 300-55-60-3000 MCG/ML IV SOLN
INTRAVENOUS | Status: AC
  Filled 2021-11-05: qty 580.8

## 2021-11-05 MED ORDER — IOHEXOL 9 MG/ML PO SOLN
500.0000 mL | ORAL | Status: AC
  Administered 2021-11-05 (×2): 500 mL via ORAL

## 2021-11-05 MED ORDER — PIPERACILLIN-TAZOBACTAM 3.375 G IVPB
3.3750 g | Freq: Three times a day (TID) | INTRAVENOUS | Status: DC
  Administered 2021-11-05 – 2021-11-06 (×4): 3.375 g via INTRAVENOUS
  Filled 2021-11-05 (×4): qty 50

## 2021-11-05 MED ORDER — VANCOMYCIN HCL 1500 MG/300ML IV SOLN
1500.0000 mg | Freq: Once | INTRAVENOUS | Status: AC
  Administered 2021-11-05: 1500 mg via INTRAVENOUS
  Filled 2021-11-05: qty 300

## 2021-11-05 NOTE — Progress Notes (Signed)
Patient ID: Danielle [name], female   DOB: Mar 27, 1956, 66 y.o.   MRN: [id number]     [place] Hospital Day(s): 13.   Interval History: Patient seen and examined, no acute events or new complaints overnight. Patient reports feeling well. She did not know that she had a fever. Denies chills. Denies abdominal pain. Endorses passing gas.   Vital signs in last 24 hours: [min-max] current  Temp:  [97.9 F (36.6 C)-102.1 F (38.9 C)] 97.9 F (36.6 C) (01/22 0828) Pulse Rate:  [77-89] 86 (01/22 0828) Resp:  [18-20] 18 (01/22 0828) BP: (97-116)/(76-83) 107/76 (01/22 0828) SpO2:  [96 %-97 %] 97 % (01/22 0828)     Height: 5\' 6"  (167.6 cm) Weight: 63.3 kg BMI (Calculated): 22.53   Physical Exam:  Constitutional: alert, cooperative and no distress  Respiratory: breathing non-labored at rest  Cardiovascular: regular rate and sinus rhythm  Gastrointestinal: soft, non-tender, and mild-distended  Labs:  CBC Latest Ref Rng & Units 11/05/2021 11/03/2021 11/03/2021  WBC 4.0 - 10.5 K/uL 13.5(H) - -  Hemoglobin 12.0 - 15.0 g/dL 7.5(L) 8.1(L) 6.5(L)  Hematocrit 36.0 - 46.0 % 22.5(L) 24.7(L) 20.0(L)  Platelets 150 - 400 K/uL 314 - -   CMP Latest Ref Rng & Units 11/05/2021 11/04/2021 11/03/2021  Glucose 70 - 99 mg/dL 128(H) 110(H) 113(H)  BUN 8 - 23 mg/dL 50(H) 35(H) 24(H)  Creatinine 0.44 - 1.00 mg/dL 1.73(H) 1.62(H) 1.46(H)  Sodium 135 - 145 mmol/L 137 139 139  Potassium 3.5 - 5.1 mmol/L 4.6 4.1 4.1  Chloride 98 - 111 mmol/L 97(L) 97(L) 102  CO2 22 - 32 mmol/L 29 34(H) 34(H)  Calcium 8.9 - 10.3 mg/dL 8.5(L) 8.7(L) 8.6(L)  Total Protein 6.5 - 8.1 g/dL - 5.9(L) 5.9(L)  Total Bilirubin 0.3 - 1.2 mg/dL - 1.1 1.3(H)  Alkaline Phos 38 - 126 U/L - 44 45  AST 15 - 41 U/L - 14(L) 15  ALT 0 - 44 U/L - 8 7    Imaging studies: Abdominal xray shows possible consolidation in the right long.    Assessment/Plan:  66 y.o. female with history of an ileostomy and mucous fistula 12 Day Post-Op s/p  ileostomy reversal, complicated by pertinent comorbidities including COPD, atrial fibrillation on anticoagulation, hypertension, chronic kidney disease stage III.   Status post ileostomy reversal -Patient with postoperative ileus.  NGT in place.  -Reglan 5 mg every 6 hours -Getting Dulcolax suppositories -We will continue with conservative management with NGT decompression, keeping electrolytes within normal limits.  Continue with TPN. -Hemoglobin 7.5  New fever -Will do complete workup for fever and elevated WBC count.  -Chest xray shows possible consolidation in right long. Antibiotic started. -Will order CT scan of abdomen to rule out intra abdominal abscess. This can explain fever and prolonged ileus. CT scan will be without IV contras due to recent acute over her chronic renal failure.    Chronic kidney disease stage III -Acute kidney injury after surgery.  This seem to be resolved.  -Appreciate hospitalist and nephrology evaluation and recommendations.   A. Fib -Intermittent rate control -Currently on therapeutic anticoagulation -Appreciate hospitalist evaluation and management   Encourage patient to get out of bed.  Will continue pain management.   Arnold Long, MD

## 2021-11-05 NOTE — Progress Notes (Signed)
Patient ID: Danielle [name], female   DOB: Dec 20, 1955, 66 y.o.   MRN: [id number] [facility] PROGRESS NOTE  SHARADA ALBORNOZ MVH:[id number] DOB: 1955/11/02 DOA: 10/23/2021 PCP: [facility]  HPI/Subjective: Patient spiked a fever overnight of 102.1.  Blood cultures sent off.  Chest x-ray shows possible pneumonia.  Initially admitted 13 days ago for elective ileostomy takedown.  Procedure patient complains of pain in her nose.  Some soreness in her abdomen.  Objective: Vitals:   11/05/21 1025 11/05/21 1435  BP: 110/78 (!) 138/110  Pulse: 92 (!) 107  Resp: 19 18  Temp: 98 F (36.7 C) 98.6 F (37 C)  SpO2: 97%     Intake/Output Summary (Last 24 hours) at 11/05/2021 1458 Last data filed at 11/05/2021 0715 Gross per 24 hour  Intake 559.5 ml  Output 400 ml  Net 159.5 ml   Filed Weights   11/02/21 0500 11/03/21 0500 11/04/21 0444  Weight: 69.7 kg 62.7 kg 63.3 kg    ROS: Review of Systems  Respiratory:  Positive for shortness of breath.   Cardiovascular:  Negative for chest pain.  Gastrointestinal:  Positive for abdominal pain. Negative for nausea and vomiting.  Exam: Physical Exam HENT:     Head: Normocephalic.     Mouth/Throat:     Pharynx: No oropharyngeal exudate.  Eyes:     General: Lids are normal.     Conjunctiva/sclera: Conjunctivae normal.  Cardiovascular:     Rate and Rhythm: Normal rate and regular rhythm.     Heart sounds: Normal heart sounds, S1 normal and S2 normal.  Pulmonary:     Breath sounds: No decreased breath sounds, wheezing, rhonchi or rales.  Abdominal:     Palpations: Abdomen is soft.     Tenderness: There is abdominal tenderness.  Musculoskeletal:     Right lower leg: No swelling.     Left lower leg: No swelling.  Skin:    General: Skin is warm.     Findings: No rash.  Neurological:     Mental Status: She is alert and oriented to person, place, and time.      Scheduled Meds:  sodium chloride   Intravenous Once    apixaban  2.5 mg Oral BID   bisacodyl  10 mg Rectal Daily   carvedilol  3.125 mg Oral BID WC   Chlorhexidine Gluconate Cloth  6 each Topical Daily   citalopram  40 mg Oral BH-q7a   diltiazem  240 mg Oral Daily   fluticasone furoate-vilanterol  1 puff Inhalation Daily   And   umeclidinium bromide  1 puff Inhalation Daily   gabapentin  300 mg Oral BID   insulin aspart  0-9 Units Subcutaneous Q4H   metoCLOPramide (REGLAN) injection  5 mg Intravenous Q6H   pantoprazole (PROTONIX) IV  40 mg Intravenous QHS   sodium chloride flush  10-40 mL Intracatheter Q12H   Continuous Infusions:  piperacillin-tazobactam (ZOSYN)  IV 3.375 g (11/05/21 0811)   TPN ADULT (ION) 55 mL/hr at 11/05/21 0337   TPN ADULT (ION)     [START ON 11/07/2021] vancomycin      Assessment/Plan:  New fever of 102.1.  Chest x-ray showing possible pneumonia.  Empiric antibiotics vancomycin and Zosyn started.  Follow-up blood cultures.  Case discussed with general surgery Dr. Windell Moment and a CT scan of the abdomen pelvis ordered. Paroxysmal atrial fibrillation on low-dose Coreg and Cardizem CD.  Continue Eliquis for anticoagulation to prevent stroke. Anemia of chronic disease with a  drop in hemoglobin.  Patient's hemoglobin is 7.5 today.  Patient had a transfusion 2 days ago on a hemoglobin of 6.4. Acute hypoxic respiratory failure.  Pulse ox 88% during the hospital course.  Had a pulse ox in the mid 80s with ambulation.  Continue oxygen. Acute on chronic diastolic congestive heart failure.  Patient has received intermittent Lasix during the hospital course. Acute kidney injury on chronic kidney disease stage IIIb creatinine peaked at 3.25 on 10/25/2021.  Creatinine 1.73 today. Postoperative ileus.  Patient had elective ileostomy reversal.  Still has NG tube in.  Continue TPN.  CT scan of the abdomen pelvis today secondary to fever. Weakness.  Continue working with physical therapy.      Code Status:     Code Status  Orders  (From admission, onward)           Start     Ordered   10/23/21 1801  Full code  Continuous        10/23/21 1800           Code Status History     Date Active Date Inactive Code Status Order ID Comments User Context   11/17/2020 0432 11/18/2020 1748 Full Code [id number]  Athena Masse, MD ED   11/05/2020 0604 11/10/2020 1631 Full Code [id number]  Benjamine Sprague, DO ED   08/01/2020 1124 08/05/2020 2228 Full Code [id number]  Collier Bullock, MD ED   01/20/2020 0807 01/21/2020 2051 Full Code [id number]  Ivor Costa, MD ED   11/06/2019 1648 11/11/2019 1749 Full Code [id number]  Max Sane, MD ED   10/29/2019 1436 11/04/2019 1859 Full Code [id number]  Ivor Costa, MD ED   10/14/2019 0253 10/21/2019 1810 Full Code [id number]  Athena Masse, MD ED   09/27/2019 0211 10/02/2019 1736 Full Code [id number]  Athena Masse, MD ED   06/19/2019 1124 06/20/2019 1742 Full Code [id number]  Lang Snow, NP ED   12/22/2018 0844 12/24/2018 1727 Full Code [id number]  Harrie Foreman, MD Inpatient   09/28/2018 0813 10/04/2018 1522 Full Code [id number]  Herbert Pun, MD Inpatient   09/05/2018 0616 09/10/2018 1708 Full Code [id number]  Arta Silence, MD ED   08/12/2018 0540 08/16/2018 2057 Full Code [id number]  Arta Silence, MD ED   07/21/2018 0332 07/23/2018 1718 Full Code [id number]  Derrek Monaco, RN Inpatient   07/18/2018 0640 07/21/2018 0330 DNR [id number]  Harrie Foreman, MD Inpatient   07/05/2018 0808 07/09/2018 1946 DNR [id number]  Harrie Foreman, MD Inpatient   06/30/2018 1733 07/02/2018 1507 DNR [id number]  [name], MD Inpatient   06/30/2018 0019 06/30/2018 1733 Full Code [id number]  Amelia Jo, MD Inpatient   06/04/2018 0113 06/05/2018 1448 Full Code [id number]  Amelia Jo, MD Inpatient   04/21/2018 0633 05/17/2018 2214 Full Code [id number]  Arta Silence, MD Inpatient   02/09/2017 1125 02/11/2017 1706 Full Code [id number]  Henreitta Leber, MD Inpatient       Family Communication: Updated patient's daughter on the phone for now we will try Disposition Plan: Status is: Inpatient  Patient with new fever above 102.  CT scan of the abdomen pelvis ordered.  Patient still has NG tube in and n.p.o.  Loletha Grayer  [facility]

## 2021-11-05 NOTE — Progress Notes (Signed)
Patient had chest x-ray in AM per report NG tube to be advanced a second time (was advanced on 1/20) patient refused, Dr. Windell Moment contacted and he said to not advance.  CT contrast administered through NG tube per Dr. Windell Moment

## 2021-11-05 NOTE — Progress Notes (Signed)
Cross Cover Patient with fever 102.52F  CBC - leukocytosis 13.5. prior 9.4, HGB slightly lower at 7.5 Lactic - not elevated - 0.9 Blood cultures UA with reflex culture Chest xray - viewed by , cardiomegaly, bilateral opacities, effusion vs edema, more in right, possible aspiration. NG tube seen crossing off pic in stomach antrum Lipase  procalcitonin MRSA screen Consider line change Mobilize, IS flutter valve Initiated zosyn and vanc pharmacy consults

## 2021-11-05 NOTE — Progress Notes (Signed)
°   11/05/21 0626  Vitals  Temp (!) 100.5 F (38.1 C)  Temp Source Oral  BP 97/79  BP Location Left Leg  BP Method Automatic  Patient Position (if appropriate) Lying  Pulse Rate 89  Pulse Rate Source Monitor  Resp 18  Level of Consciousness  Level of Consciousness Alert  MEWS COLOR  MEWS Score Color Yellow  Oxygen Therapy  SpO2 96 %  O2 Device Room Air  O2 Flow Rate (L/min) 2 L/min  Pain Assessment  Pain Scale 0-10  Pain Score 0  MEWS Score  MEWS Temp 1  MEWS Systolic 1  MEWS Pulse 0  MEWS RR 0  MEWS LOC 0  MEWS Score 2  Provider Notification  Provider Name/Title Sharion Settler  Date Provider Notified 11/05/21  Time Provider Notified [phone]  Notification Type Page  Notification Reason Change in status  Provider response See new orders  Date of Provider Response 11/05/21  Time of Provider Response 0626   IV Acetaminophen given. Will continue to monitor pt V/S. IV atbx ordered by provider. Will start this morning.

## 2021-11-05 NOTE — Progress Notes (Signed)
PHARMACY - TOTAL PARENTERAL NUTRITION CONSULT NOTE   Indication: Prolonged ileus  Patient Measurements: Height: 5\' 6"  (167.6 cm) Weight: 63.3 kg (139 lb 8.8 oz) IBW/kg (Calculated) : 59.3 TPN AdjBW (KG): 56.2 Body mass index is 22.52 kg/m.  Assessment: 66 y.o. female with a past medical history of anemia, anxiety, aortic atherosclerosis, aortic valve stenosis, chronic atrial fibrillation, stage IV CKD, breast cancer with history of chemotherapy, history of radiotherapy, COPD, depression, diastolic dysfunction, GERD, hyperlipidemia, hypertension, lymphedema, small bowel obstruction, vitamin D deficiency who presented to the hospital for an colostomy reversal. Pharmacy has been consulted for TPN.   Glucose / Insulin: BG <180, 24-hr requirement 5 u SSI Electrolytes: WNL Renal: Scr trending up Hepatic: LFTs WNL Intake / Output; MIVF: +1.6 L GI Imaging: 1/18 abdomen: persistent small bowel ileus GI Surgeries / Procedures: N/A  Central access: 1/18 TPN start date: 1/18  Nutritional Goals: Goal TPN rate is 55 mL/hr (provides 87.12 g of protein and 1710.32 kcals per day)  RD Assessment: Estimated Needs Total Energy Estimated Needs: 1700-1900kcal/day Total Protein Estimated Needs: 85-95g/day Total Fluid Estimated Needs: 1.2-1.5L/day  Current Nutrition:  NPO  Plan:  Continue TPN at goal rate 55 mL/hr Electrolytes in TPN: Na 50 mEq/L, K 25 mEq/L, Ca 5 mEq/L, Mg 5 mEq/L, and Phos 8 mmol/L. Cl:Ac 1.13:1 Phos improved with decreased phos yesterday, will add back for a total of 10 mmol Potassium trending up, decrease to ~ 30 mEq total Bicarb now WNL, will continue with slightly increased Cl:Ac ratio for another day Add standard MVI and trace elements to TPN Continue Sensitive q4h SSI and adjust as needed  Monitor TPN labs on Mon/Thurs at minimum  Tawnya Crook, PharmD, BCPS Clinical Pharmacist 11/05/2021 7:45 AM

## 2021-11-05 NOTE — Progress Notes (Signed)
Pharmacy Antibiotic Note  Danielle [name] is a 66 y.o. female admitted on 10/23/2021 with sepsis.  Pharmacy has been consulted for Vanc, Zosyn dosing.  Plan: Zosyn 3.375g IV q8h (4 hour infusion).  Vancomycin 1500 mg IV X 1 ordered for 1/22 @ ~ 0800. Vancomycin 1250 mg IV Q48H ordered for 1/24 @ 0800.  AUC = 474.2 Vanc trough = 9.5  Height: 5\' 6"  (167.6 cm) Weight: 63.3 kg (139 lb 8.8 oz) IBW/kg (Calculated) : 59.3  Temp (24hrs), Avg:99.5 F (37.5 C), Min:98 F (36.7 C), Max:102.1 F (38.9 C)  Recent Labs  Lab 10/30/21 0610 10/31/21 0438 11/01/21 0438 11/02/21 0455 11/03/21 0556 11/04/21 0435 11/05/21 0518  WBC 8.4  --  9.4  --   --   --  13.5*  CREATININE 1.25*   < > 1.23* 1.46* 1.46* 1.62* 1.73*  LATICACIDVEN  --   --   --   --   --   --  0.9   < > = values in this interval not displayed.    Estimated Creatinine Clearance: 29.5 mL/min (A) (by C-G formula based on SCr of 1.73 mg/dL (H)).    No Known Allergies  Antimicrobials this admission:   >>    >>   Dose adjustments this admission:   Microbiology results:  BCx:   UCx:    Sputum:    MRSA PCR:   Thank you for allowing pharmacy to be a part of this patients care.  Levin Dagostino D 11/05/2021 6:23 AM

## 2021-11-06 ENCOUNTER — Inpatient Hospital Stay: Payer: Medicare Other

## 2021-11-06 DIAGNOSIS — J690 Pneumonitis due to inhalation of food and vomit: Secondary | ICD-10-CM

## 2021-11-06 LAB — TRIGLYCERIDES: Triglycerides: 95 mg/dL (ref <150)

## 2021-11-06 LAB — COMPREHENSIVE METABOLIC PANEL
ALT: 9 U/L (ref 0–44)
AST: 16 U/L (ref 15–41)
Albumin: 2.7 g/dL — ABNORMAL LOW (ref 3.5–5.0)
Alkaline Phosphatase: 50 U/L (ref 38–126)
Anion gap: 9 (ref 5–15)
BUN: 57 mg/dL — ABNORMAL HIGH (ref 8–23)
CO2: 27 mmol/L (ref 22–32)
Calcium: 8.9 mg/dL (ref 8.9–10.3)
Chloride: 102 mmol/L (ref 98–111)
Creatinine, Ser: 2.03 mg/dL — ABNORMAL HIGH (ref 0.44–1.00)
GFR, Estimated: 26 mL/min — ABNORMAL LOW (ref >60)
Glucose, Bld: 120 mg/dL — ABNORMAL HIGH (ref 70–99)
Potassium: 4.1 mmol/L (ref 3.5–5.1)
Sodium: 138 mmol/L (ref 135–145)
Total Bilirubin: 2.2 mg/dL — ABNORMAL HIGH (ref 0.3–1.2)
Total Protein: 6.7 g/dL (ref 6.5–8.1)

## 2021-11-06 LAB — GLUCOSE, CAPILLARY
Glucose-Capillary: 119 mg/dL — ABNORMAL HIGH (ref 70–99)
Glucose-Capillary: 122 mg/dL — ABNORMAL HIGH (ref 70–99)
Glucose-Capillary: 132 mg/dL — ABNORMAL HIGH (ref 70–99)
Glucose-Capillary: 139 mg/dL — ABNORMAL HIGH (ref 70–99)
Glucose-Capillary: 139 mg/dL — ABNORMAL HIGH (ref 70–99)

## 2021-11-06 LAB — CBC
HCT: 23 % — ABNORMAL LOW (ref 36.0–46.0)
Hemoglobin: 7.4 g/dL — ABNORMAL LOW (ref 12.0–15.0)
MCH: 31 pg (ref 26.0–34.0)
MCHC: 32.2 g/dL (ref 30.0–36.0)
MCV: 96.2 fL (ref 80.0–100.0)
Platelets: 305 10*3/uL (ref 150–400)
RBC: 2.39 MIL/uL — ABNORMAL LOW (ref 3.87–5.11)
RDW: 16.2 % — ABNORMAL HIGH (ref 11.5–15.5)
WBC: 12.9 10*3/uL — ABNORMAL HIGH (ref 4.0–10.5)
nRBC: 0 % (ref 0.0–0.2)

## 2021-11-06 LAB — PROCALCITONIN: Procalcitonin: 0.51 ng/mL

## 2021-11-06 LAB — MAGNESIUM: Magnesium: 2.2 mg/dL (ref 1.7–2.4)

## 2021-11-06 LAB — PHOSPHORUS: Phosphorus: 3.4 mg/dL (ref 2.5–4.6)

## 2021-11-06 MED ORDER — BLISTEX MEDICATED EX OINT
TOPICAL_OINTMENT | CUTANEOUS | Status: DC | PRN
  Filled 2021-11-06: qty 6.3

## 2021-11-06 MED ORDER — SODIUM CHLORIDE 0.9 % IV SOLN
3.0000 g | Freq: Two times a day (BID) | INTRAVENOUS | Status: AC
  Administered 2021-11-06 – 2021-11-14 (×18): 3 g via INTRAVENOUS
  Filled 2021-11-06 (×2): qty 3
  Filled 2021-11-06 (×2): qty 8
  Filled 2021-11-06 (×2): qty 3
  Filled 2021-11-06: qty 8
  Filled 2021-11-06: qty 3
  Filled 2021-11-06: qty 8
  Filled 2021-11-06 (×5): qty 3
  Filled 2021-11-06: qty 8
  Filled 2021-11-06: qty 3
  Filled 2021-11-06 (×2): qty 8

## 2021-11-06 MED ORDER — VITAMIN D (ERGOCALCIFEROL) 1.25 MG (50000 UNIT) PO CAPS
50000.0000 [IU] | ORAL_CAPSULE | ORAL | Status: DC
  Administered 2021-11-06 – 2021-11-20 (×3): 50000 [IU] via ORAL
  Filled 2021-11-06 (×3): qty 1

## 2021-11-06 MED ORDER — TRACE MINERALS CU-MN-SE-ZN 300-55-60-3000 MCG/ML IV SOLN
INTRAVENOUS | Status: AC
  Filled 2021-11-06: qty 580.8

## 2021-11-06 MED ORDER — INSULIN ASPART 100 UNIT/ML IJ SOLN
0.0000 [IU] | Freq: Four times a day (QID) | INTRAMUSCULAR | Status: DC
  Administered 2021-11-06 – 2021-11-08 (×4): 1 [IU] via SUBCUTANEOUS
  Administered 2021-11-08: 19:00:00 2 [IU] via SUBCUTANEOUS
  Administered 2021-11-08 – 2021-11-09 (×3): 1 [IU] via SUBCUTANEOUS
  Filled 2021-11-06 (×8): qty 1

## 2021-11-06 NOTE — Progress Notes (Signed)
Patient ID: Danielle [name], female   DOB: [birth year], 66 y.o.   MRN: [id number] [facility] PROGRESS NOTE  Danielle [name] DOB: 06/11/1956 DOA: 10/23/2021 PCP: [facility]  HPI/Subjective: Patient thinks she feels a little bit better today.  Had numerous bowel movements last night after CT scan with oral contrast.  Still complains of pain in the nose.  As per nursing staff, NG tube was clamped.  Patient does have some shortness of breath especially if she gets up.  Objective: Vitals:   11/06/21 0417 11/06/21 0722  BP: 105/71 (!) 100/48  Pulse: 84 78  Resp: 16 18  Temp: 98.5 F (36.9 C) 98.7 F (37.1 C)  SpO2: 97% 99%    Intake/Output Summary (Last 24 hours) at 11/06/2021 1508 Last data filed at 11/06/2021 9937 Gross per 24 hour  Intake 1017.64 ml  Output 250 ml  Net 767.64 ml   Filed Weights   11/03/21 0500 11/04/21 0444 11/06/21 0500  Weight: 62.7 kg 63.3 kg 63.2 kg    ROS: Review of Systems  Respiratory:  Positive for shortness of breath.   Cardiovascular:  Negative for chest pain.  Gastrointestinal:  Positive for abdominal pain. Negative for nausea and vomiting.  Exam: Physical Exam HENT:     Head: Normocephalic.     Mouth/Throat:     Pharynx: No oropharyngeal exudate.  Eyes:     General: Lids are normal.     Conjunctiva/sclera: Conjunctivae normal.  Cardiovascular:     Rate and Rhythm: Normal rate. Rhythm irregularly irregular.     Heart sounds: S1 normal and S2 normal. Murmur heard.  Systolic murmur is present with a grade of 2/6.  Pulmonary:     Breath sounds: Examination of the right-lower field reveals decreased breath sounds. Examination of the left-lower field reveals decreased breath sounds. Decreased breath sounds present. No wheezing, rhonchi or rales.  Abdominal:     Palpations: Abdomen is soft.     Tenderness: There is generalized abdominal tenderness.  Musculoskeletal:     Right lower leg: No swelling.      Left lower leg: No swelling.  Skin:    General: Skin is warm.     Findings: No rash.  Neurological:     Mental Status: She is alert and oriented to person, place, and time.  Psychiatric:        Mood and Affect: Mood normal.      Scheduled Meds:  sodium chloride   Intravenous Once   apixaban  2.5 mg Oral BID   bisacodyl  10 mg Rectal Daily   Chlorhexidine Gluconate Cloth  6 each Topical Daily   citalopram  40 mg Oral BH-q7a   diltiazem  240 mg Oral Daily   fluticasone furoate-vilanterol  1 puff Inhalation Daily   And   umeclidinium bromide  1 puff Inhalation Daily   gabapentin  300 mg Oral BID   insulin aspart  0-9 Units Subcutaneous Q6H   metoCLOPramide (REGLAN) injection  5 mg Intravenous Q6H   pantoprazole (PROTONIX) IV  40 mg Intravenous QHS   sodium chloride flush  10-40 mL Intracatheter Q12H   Vitamin D (Ergocalciferol)  50,000 Units Oral Q7 days   Continuous Infusions:  ampicillin-sulbactam (UNASYN) IV 3 g (11/06/21 1236)   TPN ADULT (ION) 55 mL/hr at 11/05/21 2358   TPN ADULT (ION)      Assessment/Plan:  Aspiration pneumonia with fever yesterday.  Patient was empirically started on vancomycin and Zosyn I will change  antibiotics over to Unasyn. Postoperative ileus.  Patient had elective ileostomy reversal.  As per nursing staff, NG tube was clamped today.  Patient still NPO.  Continue TPN.  CT scan of the abdomen showed many air-fluid levels and 2 fluid collections.  Dr. Windell Moment does not think that these are abscesses. Paroxysmal atrial fibrillation.  Discontinue Coreg with relative hypotension.  We will give Cardizem if blood pressure comes up.  Continue Eliquis for anticoagulation. Anemia of chronic disease.  Patient's hemoglobin today is 7.4.  Did have 1 unit of packed red blood cells the other day on a hemoglobin of 6.4. Acute hypoxic respiratory failure.  Pulse ox in the mid 80s with ambulation the other day.  Continue oxygen and try to taper it off. Acute  kidney injury on chronic kidney disease stage IIIb.  Creatinine peaked at 3.25 on 10/25/2021.  Creatinine did go as low as 1.23 on 11/01/2021.  Today's creatinine up to 2.03.  Case discussed with Dr. Candiss Norse nephrology. Weakness.  Continue working with physical therapy     Code Status:     Code Status Orders  (From admission, onward)           Start     Ordered   10/23/21 1801  Full code  Continuous        10/23/21 1800           Code Status History     Date Active Date Inactive Code Status Order ID Comments User Context   11/17/2020 0432 11/18/2020 1748 Full Code [id number]  Athena Masse, MD ED   11/05/2020 0604 11/10/2020 1631 Full Code [id number]  Benjamine Sprague, DO ED   08/01/2020 1124 08/05/2020 2228 Full Code [id number]  Collier Bullock, MD ED   01/20/2020 0807 01/21/2020 2051 Full Code [id number]  Ivor Costa, MD ED   11/06/2019 1648 11/11/2019 1749 Full Code [id number]  Max Sane, MD ED   10/29/2019 1436 11/04/2019 1859 Full Code [id number]  Ivor Costa, MD ED   10/14/2019 0253 10/21/2019 1810 Full Code [id number]  Athena Masse, MD ED   09/27/2019 0211 10/02/2019 1736 Full Code [id number]  Athena Masse, MD ED   06/19/2019 1124 06/20/2019 1742 Full Code [id number]  Lang Snow, NP ED   12/22/2018 0844 12/24/2018 1727 Full Code [id number]  Harrie Foreman, MD Inpatient   09/28/2018 0813 10/04/2018 1522 Full Code [id number]  Herbert Pun, MD Inpatient   09/05/2018 0616 09/10/2018 1708 Full Code [id number]  Arta Silence, MD ED   08/12/2018 0540 08/16/2018 2057 Full Code [id number]  Arta Silence, MD ED   07/21/2018 0332 07/23/2018 1718 Full Code [id number]  Derrek Monaco, RN Inpatient   07/18/2018 0640 07/21/2018 0330 DNR [id number]  Harrie Foreman, MD Inpatient   07/05/2018 0808 07/09/2018 1946 DNR [id number]  Harrie Foreman, MD Inpatient   06/30/2018 1733 07/02/2018 1507 DNR [id number]  [name], MD Inpatient   06/30/2018 0019 06/30/2018 1733 Full  Code [id number]  Amelia Jo, MD Inpatient   06/04/2018 0113 06/05/2018 1448 Full Code [id number]  Amelia Jo, MD Inpatient   04/21/2018 0633 05/17/2018 2214 Full Code [id number]  Arta Silence, MD Inpatient   02/09/2017 1125 02/11/2017 1706 Full Code [id number]  Henreitta Leber, MD Inpatient      Family Communication: Updated patient's daughter on the phone Disposition Plan: Status is: Inpatient  NG tube clamped today.  Still with postoperative ileus.  Patient's creatinine has worsened today.  [place]  Danielle [employer]

## 2021-11-06 NOTE — Progress Notes (Signed)
Patient ID: Danielle [name], female   DOB: 07-Feb-1956, 66 y.o.   MRN: [id number]     [place] Hospital Day(s): 14.   Interval History: Patient seen and examined, no acute events or new complaints overnight. Patient reports feeling better after having multiple bowel movements.  Denies fever chills.  Mild abdominal pain  Vital signs in last 24 hours: [min-max] current  Temp:  [97.9 F (36.6 C)-99.2 F (37.3 C)] 98.7 F (37.1 C) (01/23 0722) Pulse Rate:  [78-107] 78 (01/23 0722) Resp:  [16-19] 18 (01/23 0722) BP: (100-138)/(48-110) 100/48 (01/23 0722) SpO2:  [90 %-99 %] 99 % (01/23 0722) Weight:  [63.2 kg] 63.2 kg (01/23 0500)     Height: 5\' 6"  (167.6 cm) Weight: 63.2 kg BMI (Calculated): 22.5   Physical Exam:  Constitutional: alert, cooperative and no distress  Respiratory: breathing non-labored at rest  Cardiovascular: regular rate and sinus rhythm  Gastrointestinal: soft, non-tender, and mildly-distended  Labs:  CBC Latest Ref Rng & Units 11/06/2021 11/05/2021 11/03/2021  WBC 4.0 - 10.5 K/uL 12.9(H) 13.5(H) -  Hemoglobin 12.0 - 15.0 g/dL 7.4(L) 7.5(L) 8.1(L)  Hematocrit 36.0 - 46.0 % 23.0(L) 22.5(L) 24.7(L)  Platelets 150 - 400 K/uL 305 314 -   CMP Latest Ref Rng & Units 11/06/2021 11/05/2021 11/04/2021  Glucose 70 - 99 mg/dL 120(H) 128(H) 110(H)  BUN 8 - 23 mg/dL 57(H) 50(H) 35(H)  Creatinine 0.44 - 1.00 mg/dL 2.03(H) 1.73(H) 1.62(H)  Sodium 135 - 145 mmol/L 138 137 139  Potassium 3.5 - 5.1 mmol/L 4.1 4.6 4.1  Chloride 98 - 111 mmol/L 102 97(L) 97(L)  CO2 22 - 32 mmol/L 27 29 34(H)  Calcium 8.9 - 10.3 mg/dL 8.9 8.5(L) 8.7(L)  Total Protein 6.5 - 8.1 g/dL 6.7 - 5.9(L)  Total Bilirubin 0.3 - 1.2 mg/dL 2.2(H) - 1.1  Alkaline Phos 38 - 126 U/L 50 - 44  AST 15 - 41 U/L 16 - 14(L)  ALT 0 - 44 U/L 9 - 8    Imaging studies: CT scan of abdominal pelvis shows no intra-abdominal collection or any sign of intra-abdominal infection.  Both abdominal wall fluid collections  draining serous content.   Assessment/Plan:  66 y.o. female with history of an ileostomy and mucous fistula 14 Day Post-Op s/p ileostomy reversal, complicated by pertinent comorbidities including COPD, atrial fibrillation on anticoagulation, hypertension, chronic kidney disease stage III.   Status post ileostomy reversal -Patient with postoperative ileus.  NGT in place.  -Reglan 5 mg every 6 hours -Getting Dulcolax suppositories -Patient had multiple bowel movement since last night.  This is most likely due to contrast.  I will order abdominal x-ray for assessment of ileus.  I will clamp NGT and assess if patient tolerated without nausea. -Continue with TPN. -Hemoglobin 7.4.  No sign of bleeding   Fever -Mild degrees of blood cell count -Chest xray shows consolidation in right long.  Continue empiric antibiotic therapy -CT scan without intra-abdominal sign of infections -The two collections in abdominal wall from the ileostomy wounds draining serous fluid.  No sign of abscess.   Chronic kidney disease stage III -Acute kidney injury after surgery.  This seem to be resolved.  -Appreciate hospitalist and nephrology evaluation and recommendations.   A. Fib -Intermittent rate control -Currently on therapeutic anticoagulation -Appreciate hospitalist evaluation and management   Encourage patient to get out of bed.  Will continue pain management.   Arnold Long, MD

## 2021-11-06 NOTE — Progress Notes (Signed)
[facility], [place] 11/06/21  Subjective:   LOS: 14 01/22 0701 - 01/23 0700 In: 1017.6 [I.V.:925.6; IV Piggyback:92.1] Out: 650 [Emesis/NG output:650]  Patient known to our practice from outpatient. Followed by Dr Holley Raring. Medical problems of chronic kidney disease, anemia of chronic kidney disease, secondary hyperparathyroidism, hypertension Patient is admitted for elective ileostomy takedown.  She has not been able to have adequate oral intake yet due to ileus and is maintained on TPN.  NG tube in place. Nephrology reconsulted for worsening creatinine trend. Overnight patient spiked fever of 102.1.  Blood cultures pending.  Chest x-ray suggesting possible pneumonia.    Objective:  Vital signs in last 24 hours:  Temp:  [98 F (36.7 C)-99.2 F (37.3 C)] 98.7 F (37.1 C) (01/23 0722) Pulse Rate:  [78-107] 78 (01/23 0722) Resp:  [16-19] 18 (01/23 0722) BP: (100-138)/(48-110) 100/48 (01/23 0722) SpO2:  [90 %-99 %] 99 % (01/23 0722) Weight:  [63.2 kg] 63.2 kg (01/23 0500)  Weight change:  Filed Weights   11/03/21 0500 11/04/21 0444 11/06/21 0500  Weight: 62.7 kg 63.3 kg 63.2 kg    Intake/Output:    Intake/Output Summary (Last 24 hours) at 11/06/2021 2500 Last data filed at 11/06/2021 3704 Gross per 24 hour  Intake 1017.64 ml  Output 250 ml  Net 767.64 ml     Physical Exam: General: Frail, elderly female, laying in the bed  HEENT NG tube in place  Pulm/lungs  normal breathing effort, no crackles  CVS/Heart No rub or gallop  Abdomen:  Soft, bowel sounds present in right lower quadrant  Extremities: No edema  Neurologic: Alert, able to answer questions appropriately  Skin: Warm, dry  Dialysis Access: -        Basic Metabolic Panel:  Recent Labs  Lab 11/01/21 0438 11/02/21 0455 11/03/21 0556 11/04/21 0435 11/05/21 0518 11/06/21 0443  NA 135 137 139 139 137 138  K 4.0 3.9 4.1 4.1 4.6 4.1  CL 94* 96* 102 97* 97* 102  CO2 34*  36* 34* 34* 29 27  GLUCOSE 106* 115* 113* 110* 128* 120*  BUN 14 19 24* 35* 50* 57*  CREATININE 1.23* 1.46* 1.46* 1.62* 1.73* 2.03*  CALCIUM 8.4* 8.6* 8.6* 8.7* 8.5* 8.9  MG 1.5* 2.0 1.8 1.8  --  2.2  PHOS 2.9 3.3 4.0 5.7* 3.7 3.4     CBC: Recent Labs  Lab 11/01/21 0438 11/03/21 0556 11/03/21 0849 11/03/21 1512 11/05/21 0518 11/06/21 0443  WBC 9.4  --   --   --  13.5* 12.9*  NEUTROABS 7.1  --   --   --   --   --   HGB 7.6* 6.4* 6.5* 8.1* 7.5* 7.4*  HCT 23.8*  --  20.0* 24.7* 22.5* 23.0*  MCV 96.0  --   --   --  95.3 96.2  PLT 346  --   --   --  314 305     No results found for: HEPBSAG, HEPBSAB, HEPBIGM    Microbiology:  Recent Results (from the past 240 hour(s))  MRSA Next Gen by PCR, Nasal     Status: None   Collection Time: 11/05/21  5:56 AM   Specimen: Nasal Mucosa; Nasal Swab  Result Value Ref Range Status   MRSA by PCR Next Gen NOT DETECTED NOT DETECTED Final    Comment: (NOTE) The GeneXpert MRSA Assay (FDA approved for NASAL specimens only), is one component of a comprehensive MRSA colonization surveillance program. It is not intended  to diagnose MRSA infection nor to guide or monitor treatment for MRSA infections. Test performance is not FDA approved in patients less than 86 years old. Performed at [facility] [address] [place], [place] [address]   CULTURE, BLOOD (ROUTINE X 2) w Reflex to ID Panel     Status: None (Preliminary result)   Collection Time: 11/05/21  8:09 AM   Specimen: BLOOD  Result Value Ref Range Status   Specimen Description BLOOD BLOOD RIGHT WRIST  Final   Special Requests   Final    BOTTLES DRAWN AEROBIC ONLY Blood Culture adequate volume   Culture   Final    NO GROWTH < 24 HOURS Performed at [facility], [address], [place], [place] [address]    Report Status PENDING  Incomplete  CULTURE, BLOOD (ROUTINE X 2) w Reflex to ID Panel     Status: None (Preliminary result)   Collection Time: 11/05/21   8:09 AM   Specimen: BLOOD  Result Value Ref Range Status   Specimen Description BLOOD BLOOD RIGHT HAND  Final   Special Requests   Final    BOTTLES DRAWN AEROBIC AND ANAEROBIC Blood Culture adequate volume   Culture   Final    NO GROWTH < 24 HOURS Performed at [facility], [address] [place], [place] [address]    Report Status PENDING  Incomplete    Coagulation Studies: No results for input(s): LABPROT, INR in the last 72 hours.  Urinalysis: Recent Labs    11/05/21 0845  COLORURINE YELLOW*  LABSPEC 1.015  PHURINE 6.0  GLUCOSEU NEGATIVE  HGBUR MODERATE*  BILIRUBINUR NEGATIVE  KETONESUR NEGATIVE  PROTEINUR 30*  NITRITE NEGATIVE  LEUKOCYTESUR LARGE*      Imaging: CT ABDOMEN PELVIS WO CONTRAST  Result Date: 11/05/2021 CLINICAL DATA:  Sepsis, fever, postoperative, rule out intra-abdominal abscess EXAM: CT ABDOMEN AND PELVIS WITHOUT CONTRAST TECHNIQUE: Multidetector CT imaging of the abdomen and pelvis was performed following the standard protocol without IV contrast. Oral enteric contrast was administered. RADIATION DOSE REDUCTION: This exam was performed according to the departmental dose-optimization program which includes automated exposure control, adjustment of the mA and/or kV according to patient size and/or use of iterative reconstruction technique. COMPARISON:  10/27/2021 FINDINGS: Lower chest: Small right pleural effusion associated atelectasis or consolidation. Three-vessel coronary artery calcifications. Hepatobiliary: No solid liver abnormality is seen. No gallstones, gallbladder wall thickening, or biliary dilatation. Pancreas: Unremarkable. No pancreatic ductal dilatation or surrounding inflammatory changes. Spleen: Normal in size without significant abnormality. Adrenals/Urinary Tract: Adrenal glands are unremarkable. Kidneys are normal, without renal calculi, solid lesion, or hydronephrosis. Bladder is unremarkable. Stomach/Bowel: Stomach is within  normal limits. The mid small bowel is diffusely gas and contrast filled, and mildly distended, largest loops measuring 4.1 cm. Status post right hemicolectomy. Descending and sigmoid diverticula. Vascular/Lymphatic: Aortic atherosclerosis. No enlarged abdominal or pelvic lymph nodes. Reproductive: No mass or other significant abnormality. Other: Anasarca. Persistent, although improved, scattered subcutaneous emphysema about the abdominal wall (series 2, image 38). Unchanged subcutaneous fluid collections of the left and right ventral abdomen, collection on the left measuring 3.4 x 2.2 cm (series 2, image 36), collection on the right measuring 3.5 x 2.0 cm (series 2, image 43). No ascites. Musculoskeletal: No acute or significant osseous findings. IMPRESSION: 1. Unchanged subcutaneous fluid collections of the left and right ventral abdomen, collection on the left measuring 3.4 x 2.2 cm, collection on the right measuring 3.5 x 2.0 cm. These most likely reflect postoperative  hematoma or seroma, although presence or absence of infection within this fluid is not established by CT. 2. Status post right hemicolectomy. 3. The mid small bowel is diffusely gas and contrast filled, and mildly distended, largest loops measuring 4.1 cm. Findings are most consistent with postoperative ileus. 4. Right pleural effusion and anasarca. 5. Coronary artery disease. Aortic Atherosclerosis (ICD10-I70.0). Electronically Signed   By: Delanna Ahmadi M.D.   On: 11/05/2021 17:34   DG Chest Port 1 View  Result Date: 11/05/2021 CLINICAL DATA:  Fever. EXAM: PORTABLE CHEST 1 VIEW COMPARISON:  Chest radiograph 10/26/2021. FINDINGS: Right upper extremity PICC line tip projects over the superior vena cava. Enteric tube is present. The side port is at the level of the distal esophagus. Stable enlarged cardiac and mediastinal contours. Similar-appearing distortion and consolidation within the mid right lung. Bibasilar atelectasis,  right-greater-than-left. Probable small right pleural effusion. No pneumothorax. IMPRESSION: Enteric tube side-port at the distal esophagus, recommend advancement. Right upper extremity PICC line tip projects over the superior vena cava. Similar-appearing irregular and nodular areas of consolidation within the right mid lung. These results will be called to the ordering clinician or representative by the Radiologist Assistant, and communication documented in the PACS or [employer]. Electronically Signed   By: Lovey Newcomer M.D.   On: 11/05/2021 08:04     Medications:    TPN ADULT (ION) 55 mL/hr at 11/05/21 2358   [START ON 11/07/2021] vancomycin      sodium chloride   Intravenous Once   apixaban  2.5 mg Oral BID   bisacodyl  10 mg Rectal Daily   carvedilol  3.125 mg Oral BID WC   Chlorhexidine Gluconate Cloth  6 each Topical Daily   citalopram  40 mg Oral BH-q7a   diltiazem  240 mg Oral Daily   fluticasone furoate-vilanterol  1 puff Inhalation Daily   And   umeclidinium bromide  1 puff Inhalation Daily   gabapentin  300 mg Oral BID   insulin aspart  0-9 Units Subcutaneous Q4H   metoCLOPramide (REGLAN) injection  5 mg Intravenous Q6H   pantoprazole (PROTONIX) IV  40 mg Intravenous QHS   sodium chloride flush  10-40 mL Intracatheter Q12H   albuterol, HYDROmorphone (DILAUDID) injection, lip balm, menthol-cetylpyridinium, metoprolol tartrate, ondansetron **OR** ondansetron (ZOFRAN) IV, oxyCODONE-acetaminophen, sodium chloride flush  Assessment/ Plan:  66 y.o. 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  Coronary artery disease, atherosclerosis, aortic stenosis, chronic atrial fibrillation   Principal Problem:   Ileostomy status ([place]) Active Problems:   Hyponatremia    Acute respiratory failure with hypoxia (HCC)   Weakness   Acute kidney injury superimposed on CKD (HCC)   HTN (hypertension)   AF (paroxysmal atrial fibrillation) (HCC)   Normocytic anemia   Depression   Acute on chronic diastolic CHF (congestive heart failure) (HCC)   Hyperphosphatemia   Hypomagnesemia   Anemia of chronic disease   Ileus following gastrointestinal surgery ([place])   Drop in hemoglobin   Fever   #. 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 Baseline Creatnine 1.25 / GFR  on 10/30/2021 Recent Labs    11/03/21 0556 11/04/21 0435 11/05/21 0518 11/06/21 0443  CREATININE 1.46* 1.62* 1.73* 2.03*  AKI likely multifactorial Urinalysis from 122 shows large leukocytes, no WBCs, suggesting ? interstitial nephritis.  We will still await urine culture. Blood pressure has  also been low normal with most recent blood pressure 100/48.  Hypotension can contribute to worsening renal function. Currently getting broad-spectrum antibiotics for possible right lung Aspiration pneumonia cxr 11/05/21 -Continue to treat underlying concerns We will follow closely.  No acute indication for dialysis at present  #. Anemia of CKD  Lab Results  Component Value Date   HGB 7.4 (L) 11/06/2021  monitor  #. [facility]     Component Value Date/Time   PTH 152 (H) 10/30/2019 0435   Lab Results  Component Value Date   PHOS 3.4 11/06/2021   Add ergocalciferol weekly  # S/p elective ileostomy reversal Hospital stay complicated by ileus      LOS: [place] 1/23/20239:53 [place], [place]

## 2021-11-06 NOTE — Progress Notes (Signed)
PHARMACY - TOTAL PARENTERAL NUTRITION CONSULT NOTE   Indication: Prolonged ileus  Patient Measurements: Height: 5\' 6"  (167.6 cm) Weight: 63.2 kg (139 lb 5.3 oz) IBW/kg (Calculated) : 59.3 TPN AdjBW (KG): 56.2 Body mass index is 22.49 kg/m.  Assessment: 66 y.o. female with a past medical history of anemia, anxiety, aortic atherosclerosis, aortic valve stenosis, chronic atrial fibrillation, stage IV CKD, breast cancer with history of chemotherapy, history of radiotherapy, COPD, depression, diastolic dysfunction, GERD, hyperlipidemia, hypertension, lymphedema, small bowel obstruction, vitamin D deficiency who presented to the hospital for an colostomy reversal. Pharmacy has been consulted for TPN.   Glucose / Insulin: BG <180, 24-hr requirement 4 units SSI Electrolytes: WNL Renal: Scr trending up Hepatic: LFTs WNL Intake / Output; MIVF: +1.6 L GI Imaging: 1/18 abdomen: persistent small bowel ileus GI Surgeries / Procedures: N/A  Central access: 1/18 TPN start date: 1/18  Nutritional Goals: Goal TPN rate is 55 mL/hr (provides 87.12 g of protein and 1710.32 kcals per day)  RD Assessment: Estimated Needs Total Energy Estimated Needs: 1700-1900kcal/day Total Protein Estimated Needs: 85-95g/day Total Fluid Estimated Needs: 1.2-1.5L/day  Current Nutrition:  NPO  Plan:  Continue TPN at goal rate 55 mL/hr (total volume including overfill 1420 mL) Nutritional components: Amino acids (as Clinisol 15%): 87.1 grams Dextrose: 237.6 grams Lipids (as SMOFlipids 20%): 55.4 grams  Electrolytes in TPN: Na 50 mEq/L, K 25 mEq/L, Ca 5 mEq/L, Mg 5 mEq/L, and Phos 8 mmol/L. Cl:Ac 1.13:1 Bicarb now WNL, will continue with slightly increased Cl:Ac ratio  Add standard MVI and trace elements to TPN Adjust SSI Sensitive to q6h SSI and adjust as needed  Monitor TPN labs on Mon/Thurs at minimum  Dallie Piles, PharmD, BCPS Clinical Pharmacist 11/06/2021 7:01 AM

## 2021-11-06 NOTE — Progress Notes (Signed)
Physical Therapy Treatment Patient Details Name: Danielle [name] MRN: [id number] DOB: 03-21-1956 Today's Date: 11/06/2021   History of Present Illness 66 y/o female s/p iliostomy take down 1/09.    PT Comments    Pt is making good progress towards goals with ability to ambulate around entire RN station this date. Ambulated on 3L of O2 with sats decreasing to 80%. Cues for PLB to improve sats to 88% with seated rest break. NG tube clamped at this time. ENcouraged to continue mobility efforts with RN staff. Will continue to progress as able. Goals updated this date.   Recommendations for follow up therapy are one component of a multi-disciplinary discharge planning process, led by the attending physician.  Recommendations may be updated based on patient status, additional functional criteria and insurance authorization.  Follow Up Recommendations  Home health PT     Assistance Recommended at Discharge Frequent or constant Supervision/Assistance  Patient can return home with the following A lot of help with walking and/or transfers;A lot of help with bathing/dressing/bathroom   Equipment Recommendations  Rolling walker (2 wheels)    Recommendations for Other Services       Precautions / Restrictions Precautions Precautions: Fall Restrictions Weight Bearing Restrictions: No     Mobility  Bed Mobility Overal bed mobility: Needs Assistance Bed Mobility: Supine to Sit, Sit to Supine     Supine to sit: Modified independent (Device/Increase time) Sit to supine: Modified independent (Device/Increase time)   General bed mobility comments: safe technique with ease of effort    Transfers Overall transfer level: Needs assistance Equipment used: Rolling walker (2 wheels) Transfers: Sit to/from Stand Sit to Stand: Supervision           General transfer comment: improved technique with able to push from seated surface. UPright posture noted    Ambulation/Gait Ambulation/Gait  assistance: Min guard Gait Distance (Feet): 200 Feet Assistive device: Rolling walker (2 wheels) Gait Pattern/deviations: Step-through pattern       General Gait Details: ambulated around RN station with reciprocal gait pattern and RW. 1 standing rest break. O2 sats decreased to 80% on 3L of O2 with exertion and improved to 88% after seated rest break   Stairs             Wheelchair Mobility    Modified Rankin (Stroke Patients Only)       Balance Overall balance assessment: Needs assistance Sitting-balance support: No upper extremity supported Sitting balance-Leahy Scale: Good     Standing balance support: Bilateral upper extremity supported Standing balance-Leahy Scale: Fair                              Cognition Arousal/Alertness: Awake/alert Behavior During Therapy: WFL for tasks assessed/performed Overall Cognitive Status: Within Functional Limits for tasks assessed                                          Exercises Other Exercises Other Exercises: ambulated to [facility] with supervision and able to perform self care and hygiene with supervision    General Comments        Pertinent Vitals/Pain Pain Assessment Pain Assessment: Faces Faces Pain Scale: Hurts a little bit Pain Location: abdomen Pain Descriptors / Indicators: Aching Pain Intervention(s): Limited activity within patient's tolerance, Premedicated before session    Home Living  Prior Function            PT Goals (current goals can now be found in the care plan section) Acute Rehab PT Goals Patient Stated Goal: go home PT Goal Formulation: With patient Time For Goal Achievement: 11/20/21 Potential to Achieve Goals: Fair Progress towards PT goals: Progressing toward goals    Frequency    Min 2X/week      PT Plan Current plan remains appropriate    Co-evaluation              AM-PAC PT "6 Clicks" Mobility    Outcome Measure  Help needed turning from your back to your side while in a flat bed without using bedrails?: None Help needed moving from lying on your back to sitting on the side of a flat bed without using bedrails?: None Help needed moving to and from a bed to a chair (including a wheelchair)?: A Little Help needed standing up from a chair using your arms (e.g., wheelchair or bedside chair)?: A Little Help needed to walk in hospital room?: A Little Help needed climbing 3-5 steps with a railing? : A Lot 6 Click Score: 19    End of Session Equipment Utilized During Treatment: Gait belt;Oxygen Activity Tolerance: Patient tolerated treatment well Patient left: in chair;with chair alarm set Nurse Communication: Mobility status PT Visit Diagnosis: Muscle weakness (generalized) (M62.81);Difficulty in walking, not elsewhere classified (R26.2)     Time: 4098-1191 PT Time Calculation (min) (ACUTE ONLY): 28 min  Charges:  $Gait Training: 8-22 mins $Therapeutic Activity: 8-22 mins                     Greggory Stallion, PT, DPT, GCS [phone]    Nyssa Sayegh 11/06/2021, 5:01 PM

## 2021-11-06 NOTE — Progress Notes (Signed)
Pharmacy Antibiotic Note  Danielle [name] is a 66 y.o. female w/ PMH of hypertension, Afib, COPD, GERD, DM, HLD, depression, left breast cancer (s/p of chemotherapy, radiation therapy and mastectomy 2017), dCHF, right NSCLC (s/p radiation therapy 2017), anemia, former smoker, CKD stage IV, bleeding ulcer/ischemic colitis admitted on 10/23/2021 for an ileostomy reversal.  Pharmacy has been consulted for Unasyn dosing since  Chest x-ray shows possible pneumonia. Zosyn is being changed to Unasyn for AKI.  Plan: start Unasyn 3 grams IV every 12 hours   Height: 5\' 6"  (167.6 cm) Weight: 63.2 kg (139 lb 5.3 oz) IBW/kg (Calculated) : 59.3  Temp (24hrs), Avg:98.6 F (37 C), Min:98 F (36.7 C), Max:99.2 F (37.3 C)  Recent Labs  Lab 11/01/21 0438 11/02/21 0455 11/03/21 0556 11/04/21 0435 11/05/21 0518 11/05/21 0809 11/06/21 0443  WBC 9.4  --   --   --  13.5*  --  12.9*  CREATININE 1.23* 1.46* 1.46* 1.62* 1.73*  --  2.03*  LATICACIDVEN  --   --   --   --  0.9 0.8  --     Estimated Creatinine Clearance: 25.2 mL/min (A) (by C-G formula based on SCr of 2.03 mg/dL (H)).    No Known Allergies  Antimicrobials this admission: 01/22 vancomycin x 1 01/22 Zosyn >> 01/23 01/23 Unasyn >>   Microbiology results: 01/22 BCx: NGTD 01/22 MRSA PCR: negative  Thank you for allowing pharmacy to be a part of this patients care.  Dallie Piles 11/06/2021 9:55 AM

## 2021-11-07 LAB — GLUCOSE, CAPILLARY
Glucose-Capillary: 117 mg/dL — ABNORMAL HIGH (ref 70–99)
Glucose-Capillary: 117 mg/dL — ABNORMAL HIGH (ref 70–99)
Glucose-Capillary: 117 mg/dL — ABNORMAL HIGH (ref 70–99)
Glucose-Capillary: 127 mg/dL — ABNORMAL HIGH (ref 70–99)

## 2021-11-07 LAB — BASIC METABOLIC PANEL
Anion gap: 8 (ref 5–15)
BUN: 59 mg/dL — ABNORMAL HIGH (ref 8–23)
CO2: 25 mmol/L (ref 22–32)
Calcium: 8.8 mg/dL — ABNORMAL LOW (ref 8.9–10.3)
Chloride: 104 mmol/L (ref 98–111)
Creatinine, Ser: 1.96 mg/dL — ABNORMAL HIGH (ref 0.44–1.00)
GFR, Estimated: 28 mL/min — ABNORMAL LOW (ref >60)
Glucose, Bld: 101 mg/dL — ABNORMAL HIGH (ref 70–99)
Potassium: 4.1 mmol/L (ref 3.5–5.1)
Sodium: 137 mmol/L (ref 135–145)

## 2021-11-07 LAB — HEMOGLOBIN AND HEMATOCRIT, BLOOD
HCT: 25.3 % — ABNORMAL LOW (ref 36.0–46.0)
Hemoglobin: 7.9 g/dL — ABNORMAL LOW (ref 12.0–15.0)

## 2021-11-07 LAB — PREPARE RBC (CROSSMATCH)

## 2021-11-07 LAB — HEMOGLOBIN: Hemoglobin: 6.6 g/dL — ABNORMAL LOW (ref 12.0–15.0)

## 2021-11-07 MED ORDER — SODIUM CHLORIDE 0.9% IV SOLUTION: Freq: Once | INTRAVENOUS | Status: AC

## 2021-11-07 MED ORDER — FUROSEMIDE 10 MG/ML IJ SOLN: 60.0000 mg | Freq: Once | INTRAMUSCULAR | Status: DC

## 2021-11-07 MED ORDER — TRACE MINERALS CU-MN-SE-ZN 300-55-60-3000 MCG/ML IV SOLN
INTRAVENOUS | Status: AC
  Filled 2021-11-07: qty 580.8

## 2021-11-07 MED ORDER — METOPROLOL SUCCINATE ER 25 MG PO TB24
12.5000 mg | ORAL_TABLET | Freq: Every day | ORAL | Status: DC
  Administered 2021-11-07: 23:00:00 12.5 mg via ORAL
  Filled 2021-11-07: qty 1

## 2021-11-07 NOTE — Progress Notes (Signed)
Nutrition Follow Up Note   DOCUMENTATION CODES:   Non-severe (moderate) malnutrition in context of chronic illness  INTERVENTION:   Continue TPN per pharmacy   Daily weights   Ensure Enlive po TID (chocolate/vanilla) with diet advancement, each supplement provides 350 kcal and 20 grams of protein  NUTRITION DIAGNOSIS:   Moderate Malnutrition related to cancer and cancer related treatments as evidenced by mild fat depletion, mild muscle depletion, severe muscle depletion.  GOAL:   Patient will meet greater than or equal to 90% of their needs -met with TPN   MONITOR:   PO intake, Diet advancement, Labs, Weight trends, Skin, I & O's, TPN  ASSESSMENT:   66 y.o. 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 Pt s/p 24hr clamp trial and was initiated on clear liquids today. NGT remains in place. Pt is having bowel movements and flatus. Pt continues on reglan. Pt with fevers and suspected aspiration PNA. Per chart, pt is up ~16lbs since admit. Pt + 4.8L on her I & Os. RD will add supplements once pt advanced to full liquids.   Medications reviewed and include: celexa, insulin, reglan, vitamin D, protonix, unasyn   Labs reviewed: K 4.1 wnl, BUN 59(H), creat 1.96(H) P 3.4 wnl, Mg 2.2 wnl- 1/23 Triglycerides 95- 1/23 Hgb 6.6(L) Cbgs- 117, 117, 119, 131, 122, 139 x 24 hrs  Diet Order:    Diet Order             Diet clear  liquid Room service appropriate? Yes; Fluid consistency: Thin  Diet effective now                  EDUCATION NEEDS:   Education needs have been addressed  Skin:  Skin Assessment: Reviewed RN Assessment (Incision abdomen)  Last BM:  1/23- type 6  Height:   Ht Readings from Last 1 Encounters:  10/23/21 $RemoveB'5\' 6"'TgObXrLN$  (1.676 m)    Weight:   Wt Readings from Last 1 Encounters:  11/07/21 63.6 kg    Ideal Body Weight:  59 kg  BMI:  Body mass index is 22.63 kg/m.  Estimated Nutritional Needs:   Kcal:  1700-1900kcal/day  Protein:  85-95g/day  Fluid:  1.2-1.5L/day  [name] MS, RD, LDN Please refer to [facility] for RD and/or RD on-call/weekend/after hours pager

## 2021-11-07 NOTE — Progress Notes (Signed)
PHARMACY - TOTAL PARENTERAL NUTRITION CONSULT NOTE   Indication: Prolonged ileus  Patient Measurements: Height: 5\' 6"  (167.6 cm) Weight: 63.6 kg (140 lb 3.4 oz) IBW/kg (Calculated) : 59.3 TPN AdjBW (KG): 56.2 Body mass index is 22.63 kg/m.  Assessment: 66 y.o. female with a past medical history of anemia, anxiety, aortic atherosclerosis, aortic valve stenosis, chronic atrial fibrillation, stage IV CKD, breast cancer with history of chemotherapy, history of radiotherapy, COPD, depression, diastolic dysfunction, GERD, hyperlipidemia, hypertension, lymphedema, small bowel obstruction, vitamin D deficiency who presented to the hospital for an colostomy reversal. Pharmacy has been consulted for TPN.   Glucose / Insulin: BG <180, 24-hr requirement 3 units SSI Electrolytes: WNL Renal: Scr trending up Hepatic: LFTs WNL Intake / Output; MIVF: +4.8 L GI Imaging: 1/18 abdomen: persistent small bowel ileus GI Surgeries / Procedures: N/A  Central access: 1/18 TPN start date: 1/18  Nutritional Goals: Goal TPN rate is 55 mL/hr (provides 87.12 g of protein and 1710.32 kcals per day)  RD Assessment: Estimated Needs Total Energy Estimated Needs: 1700-1900kcal/day Total Protein Estimated Needs: 85-95g/day Total Fluid Estimated Needs: 1.2-1.5L/day  Current Nutrition:  NPO  Plan:  Continue TPN at goal rate 55 mL/hr (total volume including overfill 1420 mL) Nutritional components: Amino acids (as Clinisol 15%): 87.1 grams Dextrose: 237.6 grams Lipids (as SMOFlipids 20%): 55.4 grams  Electrolytes in TPN: Na 50 mEq/L, K 25 mEq/L, Ca 5 mEq/L, Mg 5 mEq/L, and Phos 8 mmol/L. Cl:Ac 1.13:1 Bicarb now WNL, will continue with slightly increased Cl:Ac ratio  Add standard MVI and trace elements to TPN Adjust SSI Sensitive to q6h SSI and adjust as needed  Monitor TPN labs on Mon/Thurs at minimum  Dallie Piles, PharmD, BCPS Clinical Pharmacist 11/07/2021 9:20 AM

## 2021-11-07 NOTE — Progress Notes (Signed)
Patient ID: Danielle [name], female   DOB: Mar 07, 1956, 66 y.o.   MRN: [id number]     [facility] Day(s): 15.   Interval History: Patient seen and examined, no acute events or new complaints overnight. Patient reports feeling okay.  Patient denies abdominal pain.  Patient any nausea or vomiting.  She noted that she had a normal bowel movement.  She endorses that she is passing gas.  She denies any chest pain.  She denies shortness of breath.  She denies dizziness or lightheadedness.  Vital signs in last 24 hours: [min-max] current  Temp:  [97.7 F (36.5 C)-98.6 F (37 C)] 98 F (36.7 C) (01/24 0353) Pulse Rate:  [76-102] 102 (01/24 0353) Resp:  [18] 18 (01/24 0353) BP: (83-123)/(68-80) 123/80 (01/24 0353) SpO2:  [96 %-100 %] 96 % (01/24 0353) Weight:  [63.6 kg] 63.6 kg (01/24 0409)     Height: 5\' 6"  (167.6 cm) Weight: 63.6 kg BMI (Calculated): 22.64   Physical Exam:  Constitutional: alert, cooperative and no distress  Respiratory: breathing non-labored at rest  Cardiovascular: Irregular rhythm Gastrointestinal: soft, non-tender, and non-distended.  Wounds with serosanguineous drainage   Labs:  CBC Latest Ref Rng & Units 11/07/2021 11/06/2021 11/05/2021  WBC 4.0 - 10.5 K/uL - 12.9(H) 13.5(H)  Hemoglobin 12.0 - 15.0 g/dL 6.6(L) 7.4(L) 7.5(L)  Hematocrit 36.0 - 46.0 % - 23.0(L) 22.5(L)  Platelets 150 - 400 K/uL - 305 314   CMP Latest Ref Rng & Units 11/07/2021 11/06/2021 11/05/2021  Glucose 70 - 99 mg/dL 101(H) 120(H) 128(H)  BUN 8 - 23 mg/dL 59(H) 57(H) 50(H)  Creatinine 0.44 - 1.00 mg/dL 1.96(H) 2.03(H) 1.73(H)  Sodium 135 - 145 mmol/L 137 138 137  Potassium 3.5 - 5.1 mmol/L 4.1 4.1 4.6  Chloride 98 - 111 mmol/L 104 102 97(L)  CO2 22 - 32 mmol/L 25 27 29   Calcium 8.9 - 10.3 mg/dL 8.8(L) 8.9 8.5(L)  Total Protein 6.5 - 8.1 g/dL - 6.7 -  Total Bilirubin 0.3 - 1.2 mg/dL - 2.2(H) -  Alkaline Phos 38 - 126 U/L - 50 -  AST 15 - 41 U/L - 16 -  ALT 0 - 44 U/L - 9 -     Imaging studies: No new pertinent imaging studies   Assessment/Plan:  66 y.o. female with history of an ileostomy and mucous fistula 15 Day Post-Op s/p ileostomy reversal, complicated by pertinent comorbidities including COPD, atrial fibrillation on anticoagulation, hypertension, chronic kidney disease stage III.   Status post ileostomy reversal -Patient with postoperative ileus.  NGT in place.  He has been clamped for 24 hours without any nausea or vomiting.  Will give trial of clear liquids before removing NGT. -Reglan 5 mg every 6 hours -Getting Dulcolax suppositories -Continue with TPN. -Hemoglobin down to 6.6.  No sign of bleeding.  Will transfuse 1 unit of PRBC.   Fever -Patient had another episode of fever of 102.  She denies any coughing.  She denies abdominal pain.  She denies any burning on urination. -Urinalysis without white blood cells or bacteria.  Urine culture has not shown any growth in 2 days -Blood culture has not shown any growth in 2 days -Last CT scan without evidence of any intra-abdominal source of infection -Only source so far is right lung consolidation suspecting aspiration pneumonia -We will continue with IV antibiotic therapy   Chronic kidney disease stage III -Acute kidney injury after surgery.  This seem to be resolved.  -Appreciate hospitalist and  nephrology evaluation and recommendations.   A. Fib -Intermittent rate control -Currently on therapeutic anticoagulation -Appreciate hospitalist evaluation and management   Encourage patient to get out of bed.  Will continue pain management.     Arnold Long, MD

## 2021-11-07 NOTE — Progress Notes (Signed)
Mobility Specialist - Progress Note   11/07/21 0900  Mobility  Activity Contraindicated/medical hold     Per chart review, pt with critically low Hgb of 6.6 this date; contraindicating mobility. Will attempt session another date/time as medically appropriate.    Kathee Delton Mobility Specialist 11/07/21, 9:51 AM

## 2021-11-07 NOTE — Progress Notes (Signed)
Physical Therapy Treatment Patient Details Name: Danielle [name] MRN: [id number] DOB: 03/14/1956 Today's Date: 11/07/2021   History of Present Illness 66 y/o female s/p iliostomy take down 1/09.    PT Comments    Pt is making gradual progress towards goals with more motivation recently to participate. Pt able to perform supine there-ex on B LE and eager to transfers to [facility] prior to receiving blood transfusion. All mobility performed on 3L of O2 with sats decreasing to 86% with exertion. HR increases to 140s with limited mobility. Also performed IS this date and educated on frequency and duration. Ambulation not performed as pt about to receive unit of blood. Will continue to progress as able.  Recommendations for follow up therapy are one component of a multi-disciplinary discharge planning process, led by the attending physician.  Recommendations may be updated based on patient status, additional functional criteria and insurance authorization.  Follow Up Recommendations  Home health PT     Assistance Recommended at Discharge Frequent or constant Supervision/Assistance  Patient can return home with the following A lot of help with walking and/or transfers;A lot of help with bathing/dressing/bathroom   Equipment Recommendations  Rolling walker (2 wheels)    Recommendations for Other Services       Precautions / Restrictions Precautions Precautions: Fall Restrictions Weight Bearing Restrictions: No     Mobility  Bed Mobility Overal bed mobility: Needs Assistance Bed Mobility: Supine to Sit, Sit to Supine     Supine to sit: Modified independent (Device/Increase time)     General bed mobility comments: impulsive and needs cues to wait for therapist to position lines/leads for safety    Transfers Overall transfer level: Needs assistance Equipment used: 1 person hand held assist Transfers: Sit to/from Stand, Bed to chair/wheelchair/BSC Sit to Stand: Min guard Stand pivot  transfers: Min guard         General transfer comment: able to stand with HHA to transfer from bed to [facility]. Further transfers deferred as RN entered room to hang blood transfusion.    Ambulation/Gait               General Gait Details: ambulation deferred due to low Hgb and blood transfusion   Stairs             Wheelchair Mobility    Modified Rankin (Stroke Patients Only)       Balance Overall balance assessment: Needs assistance Sitting-balance support: No upper extremity supported Sitting balance-Leahy Scale: Good     Standing balance support: Bilateral upper extremity supported Standing balance-Leahy Scale: Fair                              Cognition Arousal/Alertness: Awake/alert Behavior During Therapy: WFL for tasks assessed/performed Overall Cognitive Status: Within Functional Limits for tasks assessed                                 General Comments: slightly impulsive this date, trying to get OOB prior to therapist instruction        Exercises Other Exercises Other Exercises: supne ther-ex performed on B LE including quad sets, SLRs, heel slides, and hip abd/add. 10 reps with supervision. Also performed 10 reps of IS reaching 516mL consistently Other Exercises: transferred to [facility] with cga. While performing tolieting, RN came in to hang blood and session ended    General Comments  Pertinent Vitals/Pain Pain Assessment Pain Assessment: No/denies pain    Home Living                          Prior Function            PT Goals (current goals can now be found in the care plan section) Acute Rehab PT Goals Patient Stated Goal: go home PT Goal Formulation: With patient Time For Goal Achievement: 11/20/21 Potential to Achieve Goals: Fair Progress towards PT goals: Progressing toward goals    Frequency    Min 2X/week      PT Plan Current plan remains appropriate    Co-evaluation               AM-PAC PT "6 Clicks" Mobility   Outcome Measure  Help needed turning from your back to your side while in a flat bed without using bedrails?: None Help needed moving from lying on your back to sitting on the side of a flat bed without using bedrails?: None Help needed moving to and from a bed to a chair (including a wheelchair)?: A Little Help needed standing up from a chair using your arms (e.g., wheelchair or bedside chair)?: A Little Help needed to walk in hospital room?: A Little Help needed climbing 3-5 steps with a railing? : A Lot 6 Click Score: 19    End of Session Equipment Utilized During Treatment: Oxygen Activity Tolerance: Patient tolerated treatment well Patient left:  (left with RN in room while pt on [facility]) Nurse Communication: Mobility status PT Visit Diagnosis: Muscle weakness (generalized) (M62.81);Difficulty in walking, not elsewhere classified (R26.2)     Time: 3254-9826 PT Time Calculation (min) (ACUTE ONLY): 14 min  Charges:  $Therapeutic Exercise: 8-22 mins                     Greggory Stallion, PT, DPT, GCS [phone]    Mccoy Testa 11/07/2021, 2:01 PM

## 2021-11-07 NOTE — Progress Notes (Signed)
Patient ID: Danielle [name], female   DOB: 05/01/1956, 66 y.o.   MRN: [id number] [facility] PROGRESS NOTE  REGIS WILAND TDD:[id number] DOB: [birth year] DOA: 10/23/2021 PCP: [facility]  HPI/Subjective: Patient feeling a little bit better.  Had a bowel movement yesterday.  NG tube was clamped this morning when I saw her.  Walked around the nursing station yesterday  Objective: Vitals:   11/07/21 1458 11/07/21 1534  BP: 106/78 107/79  Pulse: 100 98  Resp: 18 20  Temp: 99.3 F (37.4 C) 98.5 F (36.9 C)  SpO2: 97%     Intake/Output Summary (Last 24 hours) at 11/07/2021 1544 Last data filed at 11/07/2021 1420 Gross per 24 hour  Intake 1508.06 ml  Output --  Net 1508.06 ml   Filed Weights   11/04/21 0444 11/06/21 0500 11/07/21 0409  Weight: 63.3 kg 63.2 kg 63.6 kg    ROS: Review of Systems  Respiratory:  Positive for shortness of breath.   Cardiovascular:  Negative for chest pain.  Gastrointestinal:  Positive for abdominal pain. Negative for nausea and vomiting.  Exam: Physical Exam HENT:     Head: Normocephalic.     Mouth/Throat:     Pharynx: No oropharyngeal exudate.  Eyes:     General: Lids are normal.     Conjunctiva/sclera: Conjunctivae normal.  Cardiovascular:     Rate and Rhythm: Normal rate. Rhythm irregularly irregular.     Heart sounds: Normal heart sounds, S1 normal and S2 normal.  Pulmonary:     Breath sounds: Examination of the right-lower field reveals decreased breath sounds and rhonchi. Examination of the left-lower field reveals decreased breath sounds and rhonchi. Decreased breath sounds and rhonchi present. No wheezing or rales.  Abdominal:     Palpations: Abdomen is soft.     Tenderness: There is no abdominal tenderness.  Musculoskeletal:     Right lower leg: Swelling present.     Left lower leg: Swelling present.  Skin:    General: Skin is warm.     Findings: No rash.  Neurological:     Mental Status: She is alert and  oriented to person, place, and time.      Scheduled Meds:  sodium chloride   Intravenous Once   apixaban  2.5 mg Oral BID   bisacodyl  10 mg Rectal Daily   Chlorhexidine Gluconate Cloth  6 each Topical Daily   citalopram  40 mg Oral BH-q7a   diltiazem  240 mg Oral Daily   fluticasone furoate-vilanterol  1 puff Inhalation Daily   And   umeclidinium bromide  1 puff Inhalation Daily   gabapentin  300 mg Oral BID   insulin aspart  0-9 Units Subcutaneous Q6H   metoCLOPramide (REGLAN) injection  5 mg Intravenous Q6H   pantoprazole (PROTONIX) IV  40 mg Intravenous QHS   sodium chloride flush  10-40 mL Intracatheter Q12H   Vitamin D (Ergocalciferol)  50,000 Units Oral Q7 days   Continuous Infusions:  ampicillin-sulbactam (UNASYN) IV Stopped (11/07/21 0016)   TPN ADULT (ION) 55 mL/hr at 11/07/21 1420   TPN ADULT (ION)     Brief history.  Patient on surgical service.  66 year old female with past medical history of anemia, chronic atrial fibrillation, aortic valve stenosis, COPD, GERD, hyperlipidemia, hypertension, chronic kidney disease stage IIIb.  She was brought in electively for ileostomy takedown 15 days ago.  Has had postoperative ileus the entire hospital course.  Patient very slow to progress.  Currently on TPN.  Transfused 2 units of packed red blood cells for anemia during the hospital course.  Patient has had acute kidney injury.  Patient unable to come off the oxygen and with ambulation continues to desat.  Treating for aspiration pneumonia with a fever 2 days ago.  Assessment/Plan:  Aspiration pneumonia with fever of 102,  2 days ago.  Continue Unasyn at this point. Anemia of chronic disease.  Today's hemoglobin did dip down to 6.6.  1 unit of packed red blood cells to be given today.  Did receive another unit of packed red blood cells the other day.  We will send off a haptoglobin LDH and Coombs test tomorrow morning. Paroxysmal atrial fibrillation.  Continue Cardizem CD.  Add  Toprol-XL at night because heart rate goes up to 140 with ambulation.  Continue Eliquis for anticoagulation to prevent stroke. Postoperative ileus.  Patient is status post ileostomy reversal.  NG tube was clamped this morning and put on a trial of clear liquid diet.  Continue TPN for nutrition. Acute hypoxic respiratory failure.  With ambulation yesterday with physical therapy the patient's pulse ox did drop down into the 80s on 3 L.  Today with ambulation did decrease down to 86%. Acute kidney injury on chronic kidney disease stage IIIb.  Creatinine peaked at 3.25 on 10/25/2021.  Creatinine did go as low as 1.23 on 11/01/2021.  Today's creatinine 1.96.  Nephrology following. Weakness.  Continue working with physical therapy.  Now recommending home with home health.    Code Status:     Code Status Orders  (From admission, onward)           Start     Ordered   10/23/21 1801  Full code  Continuous        10/23/21 1800           Code Status History     Date Active Date Inactive Code Status Order ID Comments User Context   11/17/2020 0432 11/18/2020 1748 Full Code [id number]  Athena Masse, MD ED   11/05/2020 0604 11/10/2020 1631 Full Code [id number]  Benjamine Sprague, [place] [facility]   08/01/2020 1124 08/05/2020 2228 Full Code [id number]  Collier Bullock, MD ED   01/20/2020 0807 01/21/2020 2051 Full Code [id number]  Ivor Costa, MD ED   11/06/2019 1648 11/11/2019 1749 Full Code [id number]  Max Sane, MD ED   10/29/2019 1436 11/04/2019 1859 Full Code [id number]  Ivor Costa, MD ED   10/14/2019 0253 10/21/2019 1810 Full Code [id number]  Athena Masse, MD ED   09/27/2019 0211 10/02/2019 1736 Full Code [id number]  Athena Masse, MD ED   06/19/2019 1124 06/20/2019 1742 Full Code [id number]  Lang Snow, NP ED   12/22/2018 0844 12/24/2018 1727 Full Code [id number]  Harrie Foreman, MD Inpatient   09/28/2018 0813 10/04/2018 1522 Full Code [id number]  Herbert Pun, MD Inpatient   09/05/2018 0616 09/10/2018  1708 Full Code [id number]  Arta Silence, MD ED   08/12/2018 0540 08/16/2018 2057 Full Code [id number]  Arta Silence, MD ED   07/21/2018 0332 07/23/2018 1718 Full Code [id number]  Derrek Monaco, RN Inpatient   07/18/2018 0640 07/21/2018 0330 DNR [id number]  Harrie Foreman, MD Inpatient   07/05/2018 0808 07/09/2018 1946 DNR [id number]  Harrie Foreman, MD Inpatient   06/30/2018 1733 07/02/2018 1507 DNR [id number]  [name], MD Inpatient   06/30/2018 0019 06/30/2018 1733 Full Code [id number]  Amelia Jo, MD Inpatient   06/04/2018 0113 06/05/2018 1448 Full Code [id number]  Amelia Jo, MD Inpatient   04/21/2018 [id number] 05/17/2018 2214 Full Code [id number]  Arta Silence, MD Inpatient   02/09/2017 1125 02/11/2017 1706 Full Code [id number]  Henreitta Leber, MD Inpatient       Disposition Plan: Status is: Inpatient  Consultants: Nephrology  Procedures: Ileostomy takedown  Antibiotics: [place]  [facility]

## 2021-11-07 NOTE — Progress Notes (Signed)
[facility], [place] 11/07/21  Subjective:   LOS: 15 01/23 0701 - 01/24 0700 In: 821.6 [I.V.:621.6; IV Piggyback:200] Out: -   Patient known to our practice from outpatient. Followed by Dr Holley Raring. Medical problems of chronic kidney disease, anemia of chronic kidney disease, secondary hyperparathyroidism, hypertension  Patient seen and evaluated at bedside Alert and oriented RN at bedside during med Pass Patient tolerating clear liquid diet NG tube remains in place TPN  Creatinine remained stable at 1.96 4 urinary output occurrences recorded  Objective:  Vital signs in last 24 hours:  Temp:  [97.7 F (36.5 C)-99 F (37.2 C)] 98.3 F (36.8 C) (01/24 1221) Pulse Rate:  [76-102] 98 (01/24 1221) Resp:  [18-20] 20 (01/24 1221) BP: (83-123)/(68-92) 120/92 (01/24 1221) SpO2:  [96 %-100 %] 96 % (01/24 1221) Weight:  [63.6 kg] 63.6 kg (01/24 0409)  Weight change: 0.4 kg Filed Weights   11/04/21 0444 11/06/21 0500 11/07/21 0409  Weight: 63.3 kg 63.2 kg 63.6 kg    Intake/Output:    Intake/Output Summary (Last 24 hours) at 11/07/2021 1410 Last data filed at 11/07/2021 0950 Gross per 24 hour  Intake 1061.63 ml  Output --  Net 1061.63 ml      Physical Exam: General: NAD elderly female, laying in the bed  HEENT NG tube in place  Pulm/lungs  normal breathing effort, no crackles  CVS/Heart No rub or gallop  Abdomen:  Soft, bowel sounds present in right lower quadrant  Extremities: No edema  Neurologic: Alert, able to answer questions appropriately  Skin: Warm, dry          Basic Metabolic Panel:  Recent Labs  Lab 11/01/21 0438 11/02/21 0455 11/03/21 0556 11/04/21 0435 11/05/21 0518 11/06/21 0443 11/07/21 0409  NA 135 137 139 139 137 138 137  K 4.0 3.9 4.1 4.1 4.6 4.1 4.1  CL 94* 96* 102 97* 97* 102 104  CO2 34* 36* 34* 34* 29 27 25   GLUCOSE 106* 115* 113* 110* 128* 120* 101*  BUN 14 19 24* 35* 50* 57* 59*  CREATININE 1.23*  1.46* 1.46* 1.62* 1.73* 2.03* 1.96*  CALCIUM 8.4* 8.6* 8.6* 8.7* 8.5* 8.9 8.8*  MG 1.5* 2.0 1.8 1.8  --  2.2  --   PHOS 2.9 3.3 4.0 5.7* 3.7 3.4  --       CBC: Recent Labs  Lab 11/01/21 0438 11/03/21 0556 11/03/21 0849 11/03/21 1512 11/05/21 0518 11/06/21 0443 11/07/21 0409  WBC 9.4  --   --   --  13.5* 12.9*  --   NEUTROABS 7.1  --   --   --   --   --   --   HGB 7.6*   < > 6.5* 8.1* 7.5* 7.4* 6.6*  HCT 23.8*  --  20.0* 24.7* 22.5* 23.0*  --   MCV 96.0  --   --   --  95.3 96.2  --   PLT 346  --   --   --  314 305  --    < > = values in this interval not displayed.      No results found for: HEPBSAG, HEPBSAB, HEPBIGM    Microbiology:  Recent Results (from the past 240 hour(s))  MRSA Next Gen by PCR, Nasal     Status: None   Collection Time: 11/05/21  5:56 AM   Specimen: Nasal Mucosa; Nasal Swab  Result Value Ref Range Status   MRSA by PCR Next Gen NOT DETECTED NOT DETECTED  Final    Comment: (NOTE) The GeneXpert MRSA Assay (FDA approved for NASAL specimens only), is one component of a comprehensive MRSA colonization surveillance program. It is not intended to diagnose MRSA infection nor to guide or monitor treatment for MRSA infections. Test performance is not FDA approved in patients less than 49 years old. Performed at [facility], [address] [place], [place] [address]   CULTURE, BLOOD (ROUTINE X 2) w Reflex to ID Panel     Status: None (Preliminary result)   Collection Time: 11/05/21  8:09 AM   Specimen: BLOOD  Result Value Ref Range Status   Specimen Description BLOOD BLOOD RIGHT WRIST  Final   Special Requests   Final    BOTTLES DRAWN AEROBIC ONLY Blood Culture adequate volume   Culture   Final    NO GROWTH 2 DAYS Performed at [facility], [address], [place], [place] [address]    Report Status PENDING  Incomplete  CULTURE, BLOOD (ROUTINE X 2) w Reflex to ID Panel     Status: None (Preliminary result)   Collection Time:  11/05/21  8:09 AM   Specimen: BLOOD  Result Value Ref Range Status   Specimen Description BLOOD BLOOD RIGHT HAND  Final   Special Requests   Final    BOTTLES DRAWN AEROBIC AND ANAEROBIC Blood Culture adequate volume   Culture   Final    NO GROWTH 2 DAYS Performed at [facility], [address] [place], [place] [address]    Report Status PENDING  Incomplete    Coagulation Studies: No results for input(s): LABPROT, INR in the last 72 hours.  Urinalysis: Recent Labs    11/05/21 0845  COLORURINE YELLOW*  LABSPEC 1.015  PHURINE 6.0  GLUCOSEU NEGATIVE  HGBUR MODERATE*  BILIRUBINUR NEGATIVE  KETONESUR NEGATIVE  PROTEINUR 30*  NITRITE NEGATIVE  LEUKOCYTESUR LARGE*       Imaging: CT ABDOMEN PELVIS WO CONTRAST  Result Date: 11/05/2021 CLINICAL DATA:  Sepsis, fever, postoperative, rule out intra-abdominal abscess EXAM: CT ABDOMEN AND PELVIS WITHOUT CONTRAST TECHNIQUE: Multidetector CT imaging of the abdomen and pelvis was performed following the standard protocol without IV contrast. Oral enteric contrast was administered. RADIATION DOSE REDUCTION: This exam was performed according to the departmental dose-optimization program which includes automated exposure control, adjustment of the mA and/or kV according to patient size and/or use of iterative reconstruction technique. COMPARISON:  10/27/2021 FINDINGS: Lower chest: Small right pleural effusion associated atelectasis or consolidation. Three-vessel coronary artery calcifications. Hepatobiliary: No solid liver abnormality is seen. No gallstones, gallbladder wall thickening, or biliary dilatation. Pancreas: Unremarkable. No pancreatic ductal dilatation or surrounding inflammatory changes. Spleen: Normal in size without significant abnormality. Adrenals/Urinary Tract: Adrenal glands are unremarkable. Kidneys are normal, without renal calculi, solid lesion, or hydronephrosis. Bladder is unremarkable. Stomach/Bowel: Stomach is  within normal limits. The mid small bowel is diffusely gas and contrast filled, and mildly distended, largest loops measuring 4.1 cm. Status post right hemicolectomy. Descending and sigmoid diverticula. Vascular/Lymphatic: Aortic atherosclerosis. No enlarged abdominal or pelvic lymph nodes. Reproductive: No mass or other significant abnormality. Other: Anasarca. Persistent, although improved, scattered subcutaneous emphysema about the abdominal wall (series 2, image 38). Unchanged subcutaneous fluid collections of the left and right ventral abdomen, collection on the left measuring 3.4 x 2.2 cm (series 2, image 36), collection on the right measuring 3.5 x 2.0 cm (series 2, image 43). No ascites. Musculoskeletal: No acute or significant osseous findings. IMPRESSION: 1. Unchanged subcutaneous fluid collections of  the left and right ventral abdomen, collection on the left measuring 3.4 x 2.2 cm, collection on the right measuring 3.5 x 2.0 cm. These most likely reflect postoperative hematoma or seroma, although presence or absence of infection within this fluid is not established by CT. 2. Status post right hemicolectomy. 3. The mid small bowel is diffusely gas and contrast filled, and mildly distended, largest loops measuring 4.1 cm. Findings are most consistent with postoperative ileus. 4. Right pleural effusion and anasarca. 5. Coronary artery disease. Aortic Atherosclerosis (ICD10-I70.0). Electronically Signed   By: Delanna Ahmadi M.D.   On: 11/05/2021 17:34   DG Abd 2 Views  Result Date: 11/06/2021 CLINICAL DATA:  Abdominal pain EXAM: ABDOMEN - 2 VIEW COMPARISON:  Abdominal x-ray 11/03/2021 FINDINGS: Enteric tube identified with the tip in the lower esophagus. Mildly distended loops of small bowel are again seen throughout the abdomen measuring up to 3.3 cm in diameter. No free air identified on the decubitus view. No suspicious calcifications identified. IMPRESSION: 1. Enteric tube tip is in the lower  esophagus, advancement recommended. 2. Mildly distended loops of bowel throughout the abdomen which could be seen with ileus or obstruction. Continued follow-up recommended. Electronically Signed   By: Ofilia Neas M.D.   On: 11/06/2021 10:42     Medications:    ampicillin-sulbactam (UNASYN) IV Stopped (11/07/21 0016)   TPN ADULT (ION) 55 mL/hr at 11/07/21 5681   TPN ADULT (ION)      sodium chloride   Intravenous Once   apixaban  2.5 mg Oral BID   bisacodyl  10 mg Rectal Daily   Chlorhexidine Gluconate Cloth  6 each Topical Daily   citalopram  40 mg Oral BH-q7a   diltiazem  240 mg Oral Daily   fluticasone furoate-vilanterol  1 puff Inhalation Daily   And   umeclidinium bromide  1 puff Inhalation Daily   gabapentin  300 mg Oral BID   insulin aspart  0-9 Units Subcutaneous Q6H   metoCLOPramide (REGLAN) injection  5 mg Intravenous Q6H   pantoprazole (PROTONIX) IV  40 mg Intravenous QHS   sodium chloride flush  10-40 mL Intracatheter Q12H   Vitamin D (Ergocalciferol)  50,000 Units Oral Q7 days   albuterol, HYDROmorphone (DILAUDID) injection, lip balm, menthol-cetylpyridinium, metoprolol tartrate, ondansetron **OR** ondansetron (ZOFRAN) IV, oxyCODONE-acetaminophen, sodium chloride flush  Assessment/ Plan:  66 y.o. 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  Coronary artery disease, atherosclerosis, aortic stenosis, chronic atrial fibrillation   Principal Problem:   Ileostomy status ([place]) Active Problems:   Hyponatremia   Acute respiratory failure with hypoxia (HCC)   Weakness   Acute kidney injury superimposed on CKD (HCC)   HTN (hypertension)   AF (paroxysmal atrial fibrillation) (HCC)   Normocytic anemia   Depression   Acute on chronic diastolic CHF  (congestive heart failure) (HCC)   Hyperphosphatemia   Hypomagnesemia   Anemia of chronic disease   Ileus following gastrointestinal surgery (HCC)   Drop in hemoglobin   Fever   Aspiration pneumonia of right lower lobe due to gastric secretions ([employer])   #. 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 Baseline Creatnine 1.25 / GFR  on 10/30/2021 Recent Labs    11/04/21 0435 11/05/21 0518 11/06/21 0443 11/07/21 0409  CREATININE 1.62* 1.73* 2.03* 1.96*   AKI likely multifactorial Urinalysis from 122 shows large leukocytes, no  WBCs, suggesting ? interstitial nephritis.  We will still await urine culture. Blood pressure has also been low normal with most recent blood pressure 100/48.  Hypotension can contribute to worsening renal function. Currently getting Unasyn for possible right lung Aspiration pneumonia cxr 11/05/21 -Creatinine appears stable today, will continue to monitor. No acute indication for dialysis at present  #. Anemia of CKD  Lab Results  Component Value Date   HGB 6.6 (L) 11/07/2021   Hemoglobin below desired target.  Primary team to transfuse 1 unit PRBCs.  #. SHPTH     Component Value Date/Time   PTH 152 (H) 10/30/2019 0435   Lab Results  Component Value Date   PHOS 3.4 11/06/2021  Calcium remains within desired target. Recommend ergocalciferol weekly  # S/p elective ileostomy reversal Hospital stay complicated by ileus      LOS: [place] 1/24/20232:11 [place], [place]

## 2021-11-08 DIAGNOSIS — R079 Chest pain, unspecified: Secondary | ICD-10-CM

## 2021-11-08 LAB — TYPE AND SCREEN
ABO/RH(D): O POS
Antibody Screen: NEGATIVE
Unit division: 0

## 2021-11-08 LAB — GLUCOSE, CAPILLARY
Glucose-Capillary: 113 mg/dL — ABNORMAL HIGH (ref 70–99)
Glucose-Capillary: 120 mg/dL — ABNORMAL HIGH (ref 70–99)
Glucose-Capillary: 121 mg/dL — ABNORMAL HIGH (ref 70–99)
Glucose-Capillary: 127 mg/dL — ABNORMAL HIGH (ref 70–99)
Glucose-Capillary: 153 mg/dL — ABNORMAL HIGH (ref 70–99)

## 2021-11-08 LAB — BPAM RBC
Blood Product Expiration Date: 202303012359
ISSUE DATE / TIME: 202301241156
Unit Type and Rh: 5100

## 2021-11-08 LAB — RENAL FUNCTION PANEL
Albumin: 2.6 g/dL — ABNORMAL LOW (ref 3.5–5.0)
Anion gap: 9 (ref 5–15)
BUN: 60 mg/dL — ABNORMAL HIGH (ref 8–23)
CO2: 23 mmol/L (ref 22–32)
Calcium: 8.7 mg/dL — ABNORMAL LOW (ref 8.9–10.3)
Chloride: 104 mmol/L (ref 98–111)
Creatinine, Ser: 1.92 mg/dL — ABNORMAL HIGH (ref 0.44–1.00)
GFR, Estimated: 28 mL/min — ABNORMAL LOW (ref >60)
Glucose, Bld: 109 mg/dL — ABNORMAL HIGH (ref 70–99)
Phosphorus: 4.4 mg/dL (ref 2.5–4.6)
Potassium: 4 mmol/L (ref 3.5–5.1)
Sodium: 136 mmol/L (ref 135–145)

## 2021-11-08 LAB — TROPONIN I (HIGH SENSITIVITY)
Troponin I (High Sensitivity): 38 ng/L — ABNORMAL HIGH (ref <18)
Troponin I (High Sensitivity): 34 ng/L — ABNORMAL HIGH (ref <18)

## 2021-11-08 LAB — LACTATE DEHYDROGENASE: LDH: 120 U/L (ref 98–192)

## 2021-11-08 LAB — DAT, POLYSPECIFIC AHG (ARMC ONLY): Polyspecific AHG test: NEGATIVE

## 2021-11-08 LAB — HEMOGLOBIN: Hemoglobin: 7.7 g/dL — ABNORMAL LOW (ref 12.0–15.0)

## 2021-11-08 MED ORDER — FUROSEMIDE 10 MG/ML IJ SOLN
80.0000 mg | Freq: Once | INTRAMUSCULAR | Status: AC
  Administered 2021-11-08: 12:00:00 80 mg via INTRAVENOUS
  Filled 2021-11-08: qty 8

## 2021-11-08 MED ORDER — ENSURE ENLIVE PO LIQD
237.0000 mL | Freq: Three times a day (TID) | ORAL | Status: DC
  Administered 2021-11-10 (×2): 237 mL via ORAL

## 2021-11-08 MED ORDER — FUROSEMIDE 10 MG/ML IJ SOLN
40.0000 mg | Freq: Two times a day (BID) | INTRAMUSCULAR | Status: DC
  Administered 2021-11-08 – 2021-11-09 (×2): 40 mg via INTRAVENOUS
  Filled 2021-11-08 (×2): qty 4

## 2021-11-08 MED ORDER — TRACE MINERALS CU-MN-SE-ZN 300-55-60-3000 MCG/ML IV SOLN
INTRAVENOUS | Status: DC
  Filled 2021-11-08: qty 580.8

## 2021-11-08 NOTE — Progress Notes (Signed)
Mobility Specialist - Progress Note   11/08/21 1136  Mobility  Activity Ambulated with assistance in hallway  Level of Assistance Standby assist, set-up cues, supervision of patient - no hands on  Assistive Device Front wheel walker  Distance Ambulated (ft) 80 ft  Activity Response Tolerated fair  Transport method Ambulatory  $Mobility charge 1 Mobility    Pre-mobility: 89 HR, 92% SpO2 During mobility: 102 HR, 86-90% SpO2 Post-mobility: 100 HR, 91% SpO2   Pt lying in bed upon arrival, utilizing 4L. Pt exited bed with supervision, denied dizziness with transfers. While ambulating in hallway, pt voiced SOB---O2 desat to a low of 86%. During return distance, pt c/o dizziness and a seated rest break was taken. Noted pt trembling and eyes began to roll; able to answer AO questions with decreased response time. O2 bumped up to 5L with sats 86-90% on tele but reading high 70s via pulse ox. Max HR 102 bpm. Pt was wheeled back to room with +2-3 assist. Vitals checked, BP reading 153/117. Pt returned supine with alarm set, needs in reach. RN immediately notified.    Kathee Delton Mobility Specialist 11/08/21, 11:44 AM

## 2021-11-08 NOTE — Progress Notes (Signed)
[facility], [place] 11/08/21  Subjective:   LOS: 16 01/24 0701 - 01/25 0700 In: 1999.5 [P.O.:720; I.V.:541.7; Blood:682; IV Piggyback:55.8] Out: -   Patient known to our practice from outpatient. Followed by Dr Holley Raring. Medical problems of chronic kidney disease, anemia of chronic kidney disease, secondary hyperparathyroidism, hypertension  Patient seen sitting up in bed, alert and oriented NG tube has been removed but TPN remains.  Tolerating clear liquid diet. Remains on oxygen, 2 L.    Patient seen later in the day after working with mobility tech.  Patient complains of shortness of breath and appears anxious.  Creatinine remained stable at 1.92 4 urinary output occurrences recorded  Objective:  Vital signs in last 24 hours:  Temp:  [97.7 F (36.5 C)-99.3 F (37.4 C)] 97.7 F (36.5 C) (01/25 1113) Pulse Rate:  [77-100] 96 (01/25 1113) Resp:  [18-20] 18 (01/25 0256) BP: (96-153)/(69-117) 153/117 (01/25 1113) SpO2:  [94 %-98 %] 94 % (01/25 1113) Weight:  [66.5 kg] 66.5 kg (01/25 1033)  Weight change:  Filed Weights   11/06/21 0500 11/07/21 0409 11/08/21 1033  Weight: 63.2 kg 63.6 kg 66.5 kg    Intake/Output:    Intake/Output Summary (Last 24 hours) at 11/08/2021 1429 Last data filed at 11/08/2021 1415 Gross per 24 hour  Intake 1833.07 ml  Output 75 ml  Net 1758.07 ml      Physical Exam: General: NAD elderly female, laying in the bed  HEENT Moist oral mucosa  Pulm/lungs Tachypneic, basilar crackles  CVS/Heart No rub or gallop  Abdomen:  Soft, bowel sounds present in right lower quadrant  Extremities: No edema  Neurologic: Alert, able to answer questions appropriately  Skin: Warm, dry          Basic Metabolic Panel:  Recent Labs  Lab 11/02/21 0455 11/03/21 0556 11/04/21 0435 11/05/21 0518 11/06/21 0443 11/07/21 0409 11/08/21 0337  NA 137 139 139 137 138 137 136  K 3.9 4.1 4.1 4.6 4.1 4.1 4.0  CL 96* 102 97* 97*  102 104 104  CO2 36* 34* 34* 29 27 25 23   GLUCOSE 115* 113* 110* 128* 120* 101* 109*  BUN 19 24* 35* 50* 57* 59* 60*  CREATININE 1.46* 1.46* 1.62* 1.73* 2.03* 1.96* 1.92*  CALCIUM 8.6* 8.6* 8.7* 8.5* 8.9 8.8* 8.7*  MG 2.0 1.8 1.8  --  2.2  --   --   PHOS 3.3 4.0 5.7* 3.7 3.4  --  4.4      CBC: Recent Labs  Lab 11/03/21 0849 11/03/21 1512 11/05/21 0518 11/06/21 0443 11/07/21 0409 11/07/21 1633 11/08/21 0339  WBC  --   --  13.5* 12.9*  --   --   --   HGB 6.5* 8.1* 7.5* 7.4* 6.6* 7.9* 7.7*  HCT 20.0* 24.7* 22.5* 23.0*  --  25.3*  --   MCV  --   --  95.3 96.2  --   --   --   PLT  --   --  314 305  --   --   --       No results found for: HEPBSAG, HEPBSAB, HEPBIGM    Microbiology:  Recent Results (from the past 240 hour(s))  MRSA Next Gen by PCR, Nasal     Status: None   Collection Time: 11/05/21  5:56 AM   Specimen: Nasal Mucosa; Nasal Swab  Result Value Ref Range Status   MRSA by PCR Next Gen NOT DETECTED NOT DETECTED Final  Comment: (NOTE) The GeneXpert MRSA Assay (FDA approved for NASAL specimens only), is one component of a comprehensive MRSA colonization surveillance program. It is not intended to diagnose MRSA infection nor to guide or monitor treatment for MRSA infections. Test performance is not FDA approved in patients less than 53 years old. Performed at [facility], [address] [place], [place] [address]   CULTURE, BLOOD (ROUTINE X 2) w Reflex to ID Panel     Status: None (Preliminary result)   Collection Time: 11/05/21  8:09 AM   Specimen: BLOOD  Result Value Ref Range Status   Specimen Description BLOOD BLOOD RIGHT WRIST  Final   Special Requests   Final    BOTTLES DRAWN AEROBIC ONLY Blood Culture adequate volume   Culture   Final    NO GROWTH 2 DAYS Performed at [facility], [address], [place], [place] [address]    Report Status PENDING  Incomplete  CULTURE, BLOOD (ROUTINE X 2) w Reflex to ID Panel     Status:  None (Preliminary result)   Collection Time: 11/05/21  8:09 AM   Specimen: BLOOD  Result Value Ref Range Status   Specimen Description BLOOD BLOOD RIGHT HAND  Final   Special Requests   Final    BOTTLES DRAWN AEROBIC AND ANAEROBIC Blood Culture adequate volume   Culture   Final    NO GROWTH 2 DAYS Performed at [facility], [address] [place],  [address]    Report Status PENDING  Incomplete    Coagulation Studies: No results for input(s): LABPROT, INR in the last 72 hours.  Urinalysis: No results for input(s): COLORURINE, LABSPEC, PHURINE, GLUCOSEU, HGBUR, BILIRUBINUR, KETONESUR, PROTEINUR, UROBILINOGEN, NITRITE, LEUKOCYTESUR in the last 72 hours.  Invalid input(s): APPERANCEUR     Imaging: No results found.   Medications:    ampicillin-sulbactam (UNASYN) IV 3 g (11/08/21 1214)   TPN ADULT (ION) 55 mL/hr at 11/07/21 1843   TPN ADULT (ION)      sodium chloride   Intravenous Once   apixaban  2.5 mg Oral BID   bisacodyl  10 mg Rectal Daily   Chlorhexidine Gluconate Cloth  6 each Topical Daily   citalopram  40 mg Oral BH-q7a   diltiazem  240 mg Oral Daily   feeding supplement  237 mL Oral TID BM   fluticasone furoate-vilanterol  1 puff Inhalation Daily   And   umeclidinium bromide  1 puff Inhalation Daily   gabapentin  300 mg Oral BID   insulin aspart  0-9 Units Subcutaneous Q6H   metoCLOPramide (REGLAN) injection  5 mg Intravenous Q6H   pantoprazole (PROTONIX) IV  40 mg Intravenous QHS   sodium chloride flush  10-40 mL Intracatheter Q12H   Vitamin D (Ergocalciferol)  50,000 Units Oral Q7 days   albuterol, HYDROmorphone (DILAUDID) injection, lip balm, menthol-cetylpyridinium, metoprolol tartrate, ondansetron **OR** ondansetron (ZOFRAN) IV, oxyCODONE-acetaminophen, sodium chloride flush  Assessment/ Plan:  66 y.o. 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  Coronary artery disease, atherosclerosis, aortic stenosis, chronic atrial fibrillation   Principal Problem:   Ileostomy status (HCC) Active Problems:   Hyponatremia   Acute respiratory failure with hypoxia (HCC)   Weakness   Acute kidney injury superimposed on CKD (HCC)   HTN (hypertension)   AF (paroxysmal atrial fibrillation) ([place])  Normocytic anemia   Depression   Acute on chronic diastolic CHF (congestive heart failure) (HCC)   Hyperphosphatemia   Hypomagnesemia   Anemia of chronic disease   Ileus following gastrointestinal surgery (HCC)   Drop in hemoglobin   Fever   Aspiration pneumonia of right lower lobe due to gastric secretions ([place])   #. 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 Baseline Creatnine 1.25 / GFR  on 10/30/2021 Recent Labs    11/05/21 0518 11/06/21 0443 11/07/21 0409 11/08/21 0337  CREATININE 1.73* 2.03* 1.96* 1.92*   AKI likely multifactorial Urinalysis from 122 shows large leukocytes, no WBCs, suggesting ? interstitial nephritis.  We will still await urine culture. Blood pressure has also been low normal with most recent blood pressure 100/48.  Hypotension can contribute to worsening renal function. Currently getting Unasyn for possible right lung Aspiration pneumonia cxr 11/05/21 -Creatinine continues to improve slowly -We will order IV Lasix 80 mg once, patient exhibits signs of fluid overload after exertion with lung crackles. If further diuresis is required, can prescribe Furosemide 40mg  twice daily. No acute indication for dialysis at present  #. Anemia of CKD  Lab Results  Component Value Date   HGB 7.7 (L) 11/08/2021   Hemoglobin remains below target but improved from 6.6.  Patient received 1 unit RBCs yesterday by primary team.  We will continue to monitor during this admission.  #. SHPTH     Component Value Date/Time    PTH 152 (H) 10/30/2019 0435   Lab Results  Component Value Date   PHOS 4.4 11/08/2021  Calcium remains within desired target. Recommend ergocalciferol weekly  # S/p elective ileostomy reversal Hospital stay complicated by ileus NG tube has been removed but remains on TPN We will continue to monitor fluid volume     LOS: 16   1/25/20232:29 PM  [facility], [place]

## 2021-11-08 NOTE — Progress Notes (Signed)
PROGRESS NOTE    Danielle [name]  TFT:[id number] DOB: October 06, 1956 DOA: 10/23/2021 PCP: [facility]    Assessment & Plan:   Principal Problem:   Ileostomy status ([place]) Active Problems:   Hyponatremia   Acute respiratory failure with hypoxia ([place])   Weakness   Acute kidney injury superimposed on CKD (HCC)   HTN (hypertension)   AF (paroxysmal atrial fibrillation) (HCC)   Normocytic anemia   Depression   Acute on chronic diastolic CHF (congestive heart failure) (HCC)   Hyperphosphatemia   Hypomagnesemia   Anemia of chronic disease   Ileus following gastrointestinal surgery (HCC)   Drop in hemoglobin   Fever   Aspiration pneumonia of right lower lobe due to gastric secretions (HCC)   Aspiration pneumonia: continue on IV unasyn, bronchodilators and encourage incentive spirometry   ACD: H&H are labile. S/p 2 units of pRBCs transfused so far    PAF: continue on cardizem, metoprolol & eliquis   Postoperative ileus: s/p ileostomy reversal. NG tube has been d/c. Continue on full liquid diet. Continue TPN. Management as per general surg  Acute hypoxic respiratory failure: pulse ox did drop down into the 80s on 3 L.  Continue on supplemental oxygen and wean as tolerated.  Likely secondary to aspiration pneumonia. Encourage incentive spirometry  AKI on CKDIIIb: Cr continues to trend down. Nephro following and recs apprec  Weakness: PT recs HH   DVT prophylaxis: eliquis  Code Status: full  Family Communication:  Disposition Plan: as per general surg recs   Level of care: Telemetry Medical  Status is: Inpatient  Remains inpatient appropriate because: severity of illness  Consultants:  Hospitalist  General surg (primary)  Procedures:   s/p ileostomy reversal.  Antimicrobials: unasyn    Subjective: Pt c/o shortness of breath   Objective: Vitals:   11/07/21 1534 11/07/21 1811 11/07/21 2234 11/08/21 0256  BP: 107/79 96/69 113/80 98/77  Pulse: 98  86 94 77  Resp: 20 20 19 18   Temp: 98.5 F (36.9 C) 97.9 F (36.6 C) 98.1 F (36.7 C) 98.4 F (36.9 C)  TempSrc: Oral Oral Oral   SpO2:  98% 96% 97%  Weight:      Height:        Intake/Output Summary (Last 24 hours) at 11/08/2021 0824 Last data filed at 11/08/2021 0330 Gross per 24 hour  Intake 1999.5 ml  Output --  Net 1999.5 ml   Filed Weights   11/04/21 0444 11/06/21 0500 11/07/21 0409  Weight: 63.3 kg 63.2 kg 63.6 kg    Examination:  General exam: Appears calm and comfortable  Respiratory system: diminished breath sounds b/l  Cardiovascular system: S1 & S2+. No rubs, gallops or clicks.  Gastrointestinal system: Abdomen is nondistended, soft and nontender. Normal bowel sounds heard. Central nervous system: Alert and oriented. Moves all extremities  Psychiatry: Judgement and insight appear normal. Flat mood and affect     Data Reviewed: I have personally reviewed following labs and imaging studies  CBC: Recent Labs  Lab 11/03/21 0849 11/03/21 1512 11/05/21 0518 11/06/21 0443 11/07/21 0409 11/07/21 1633 11/08/21 0339  WBC  --   --  13.5* 12.9*  --   --   --   HGB 6.5* 8.1* 7.5* 7.4* 6.6* 7.9* 7.7*  HCT 20.0* 24.7* 22.5* 23.0*  --  25.3*  --   MCV  --   --  95.3 96.2  --   --   --   PLT  --   --  314 305  --   --   --    Basic Metabolic Panel: Recent Labs  Lab 11/02/21 0455 11/03/21 0556 11/04/21 0435 11/05/21 0518 11/06/21 0443 11/07/21 0409 11/08/21 0337  NA 137 139 139 137 138 137 136  K 3.9 4.1 4.1 4.6 4.1 4.1 4.0  CL 96* 102 97* 97* 102 104 104  CO2 36* 34* 34* 29 27 25 23   GLUCOSE 115* 113* 110* 128* 120* 101* 109*  BUN 19 24* 35* 50* 57* 59* 60*  CREATININE 1.46* 1.46* 1.62* 1.73* 2.03* 1.96* 1.92*  CALCIUM 8.6* 8.6* 8.7* 8.5* 8.9 8.8* 8.7*  MG 2.0 1.8 1.8  --  2.2  --   --   PHOS 3.3 4.0 5.7* 3.7 3.4  --  4.4   GFR: Estimated Creatinine Clearance: 26.6 mL/min (A) (by C-G formula based on SCr of 1.92 mg/dL (H)). Liver Function  Tests: Recent Labs  Lab 11/02/21 0455 11/03/21 0556 11/04/21 0435 11/06/21 0443 11/08/21 0337  AST 15 15 14* 16  --   ALT 9 7 8 9   --   ALKPHOS 49 45 44 50  --   BILITOT 1.5* 1.3* 1.1 2.2*  --   PROT 5.9* 5.9* 5.9* 6.7  --   ALBUMIN 2.6* 2.5* 2.6* 2.7* 2.6*   Recent Labs  Lab 11/05/21 0518  LIPASE 22   No results for input(s): AMMONIA in the last 168 hours. Coagulation Profile: No results for input(s): INR, PROTIME in the last 168 hours. Cardiac Enzymes: No results for input(s): CKTOTAL, CKMB, CKMBINDEX, TROPONINI in the last 168 hours. BNP (last 3 results) No results for input(s): PROBNP in the last 8760 hours. HbA1C: No results for input(s): HGBA1C in the last 72 hours. CBG: Recent Labs  Lab 11/07/21 0558 11/07/21 1218 11/07/21 1804 11/08/21 0008 11/08/21 0646  GLUCAP 117* 127* 117* 113* 120*   Lipid Profile: Recent Labs    11/06/21 0443  TRIG 95   Thyroid Function Tests: No results for input(s): TSH, T4TOTAL, FREET4, T3FREE, THYROIDAB in the last 72 hours. Anemia Panel: No results for input(s): VITAMINB12, FOLATE, FERRITIN, TIBC, IRON, RETICCTPCT in the last 72 hours. Sepsis Labs: Recent Labs  Lab 11/05/21 0518 11/05/21 0809 11/06/21 0443  PROCALCITON <0.10  --  0.51  LATICACIDVEN 0.9 0.8  --     Recent Results (from the past 240 hour(s))  MRSA Next Gen by PCR, Nasal     Status: None   Collection Time: 11/05/21  5:56 AM   Specimen: Nasal Mucosa; Nasal Swab  Result Value Ref Range Status   MRSA by PCR Next Gen NOT DETECTED NOT DETECTED Final    Comment: (NOTE) The GeneXpert MRSA Assay (FDA approved for NASAL specimens only), is one component of a comprehensive MRSA colonization surveillance program. It is not intended to diagnose MRSA infection nor to guide or monitor treatment for MRSA infections. Test performance is not FDA approved in patients less than 48 years old. Performed at [facility], [name] [place], [place]  [address]   CULTURE, BLOOD (ROUTINE X 2) w Reflex to ID Panel     Status: None (Preliminary result)   Collection Time: 11/05/21  8:09 AM   Specimen: BLOOD  Result Value Ref Range Status   Specimen Description BLOOD BLOOD RIGHT WRIST  Final   Special Requests   Final    BOTTLES DRAWN AEROBIC ONLY Blood Culture adequate volume   Culture   Final    NO GROWTH 2 DAYS Performed at  [facility], [address], [place], [place] [address]    Report Status PENDING  Incomplete  CULTURE, BLOOD (ROUTINE X 2) w Reflex to ID Panel     Status: None (Preliminary result)   Collection Time: 11/05/21  8:09 AM   Specimen: BLOOD  Result Value Ref Range Status   Specimen Description BLOOD BLOOD RIGHT HAND  Final   Special Requests   Final    BOTTLES DRAWN AEROBIC AND ANAEROBIC Blood Culture adequate volume   Culture   Final    NO GROWTH 2 DAYS Performed at [facility], [address], [place], [place] [address]    Report Status PENDING  Incomplete         Radiology Studies: DG Abd 2 Views  Result Date: 11/06/2021 CLINICAL DATA:  Abdominal pain EXAM: ABDOMEN - 2 VIEW COMPARISON:  Abdominal x-ray 11/03/2021 FINDINGS: Enteric tube identified with the tip in the lower esophagus. Mildly distended loops of small bowel are again seen throughout the abdomen measuring up to 3.3 cm in diameter. No free air identified on the decubitus view. No suspicious calcifications identified. IMPRESSION: 1. Enteric tube tip is in the lower esophagus, advancement recommended. 2. Mildly distended loops of bowel throughout the abdomen which could be seen with ileus or obstruction. Continued follow-up recommended. Electronically Signed   By: Ofilia Neas M.D.   On: 11/06/2021 10:42        Scheduled Meds:  sodium chloride   Intravenous Once   apixaban  2.5 mg Oral BID   bisacodyl  10 mg Rectal Daily   Chlorhexidine Gluconate Cloth  6 each Topical Daily   citalopram  40 mg Oral BH-q7a   diltiazem   240 mg Oral Daily   fluticasone furoate-vilanterol  1 puff Inhalation Daily   And   umeclidinium bromide  1 puff Inhalation Daily   gabapentin  300 mg Oral BID   insulin aspart  0-9 Units Subcutaneous Q6H   metoCLOPramide (REGLAN) injection  5 mg Intravenous Q6H   metoprolol succinate  12.5 mg Oral QHS   pantoprazole (PROTONIX) IV  40 mg Intravenous QHS   sodium chloride flush  10-40 mL Intracatheter Q12H   Vitamin D (Ergocalciferol)  50,000 Units Oral Q7 days   Continuous Infusions:  ampicillin-sulbactam (UNASYN) IV 3 g (11/08/21 0120)   TPN ADULT (ION) 55 mL/hr at 11/07/21 1843     LOS: 16 days    Time spent: 32 mins     Wyvonnia Dusky, MD [facility] Pager [phone]  If 7PM-7AM, please contact night-coverage 11/08/2021, 8:24 AM

## 2021-11-08 NOTE — Progress Notes (Signed)
Patient ID: Danielle [name], female   DOB: 01/28/1956, 66 y.o.   MRN: [id number]     [facility] Day(s): 16.   Interval History: Patient seen and examined, no acute events or new complaints overnight. Patient reports she feels tired after ambulating.  She denies chest pain.  No shortness of breath.  She denies any nausea or vomiting.  She has been passing gas and having bowel movement.  She has been tolerating clear liquid diet.  Vital signs in last 24 hours: [min-max] current  Temp:  [97.7 F (36.5 C)-99.3 F (37.4 C)] 97.7 F (36.5 C) (01/25 1113) Pulse Rate:  [77-100] 96 (01/25 1113) Resp:  [18-20] 18 (01/25 0256) BP: (96-153)/(69-117) 153/117 (01/25 1113) SpO2:  [94 %-98 %] 94 % (01/25 1113) Weight:  [66.5 kg] 66.5 kg (01/25 1033)     Height: 5\' 6"  (167.6 cm) Weight: 66.5 kg BMI (Calculated): 23.67   Physical Exam:  Constitutional: alert, cooperative and no distress  Respiratory: breathing non-labored at rest  Cardiovascular: regular rate and sinus rhythm  Gastrointestinal: soft, non-tender, and non-distended  Labs:  CBC Latest Ref Rng & Units 11/08/2021 11/07/2021 11/07/2021  WBC 4.0 - 10.5 K/uL - - -  Hemoglobin 12.0 - 15.0 g/dL 7.7(L) 7.9(L) 6.6(L)  Hematocrit 36.0 - 46.0 % - 25.3(L) -  Platelets 150 - 400 K/uL - - -   CMP Latest Ref Rng & Units 11/08/2021 11/07/2021 11/06/2021  Glucose 70 - 99 mg/dL 109(H) 101(H) 120(H)  BUN 8 - 23 mg/dL 60(H) 59(H) 57(H)  Creatinine 0.44 - 1.00 mg/dL 1.92(H) 1.96(H) 2.03(H)  Sodium 135 - 145 mmol/L 136 137 138  Potassium 3.5 - 5.1 mmol/L 4.0 4.1 4.1  Chloride 98 - 111 mmol/L 104 104 102  CO2 22 - 32 mmol/L 23 25 27   Calcium 8.9 - 10.3 mg/dL 8.7(L) 8.8(L) 8.9  Total Protein 6.5 - 8.1 g/dL - - 6.7  Total Bilirubin 0.3 - 1.2 mg/dL - - 2.2(H)  Alkaline Phos 38 - 126 U/L - - 50  AST 15 - 41 U/L - - 16  ALT 0 - 44 U/L - - 9    Imaging studies: No new pertinent imaging studies   Assessment/Plan:  66 y.o. female with  history of an ileostomy and mucous fistula 16 Day Post-Op s/p ileostomy reversal, complicated by pertinent comorbidities including COPD, atrial fibrillation on anticoagulation, hypertension, chronic kidney disease stage III.   Status post ileostomy reversal -Patient with postoperative ileus resolving. -Tolerated clear liquids and continued having gas and bowel movement.  Will advance to full liquid diet. -Reglan 5 mg every 6 hours -Getting Dulcolax suppositories -Continue with TPN. -Hemoglobin stable today at 7.7    Fever -No fever in last 24 hours -Urinalysis without white blood cells or bacteria.  Urine culture has not shown any growth in 2 days -Blood culture has not shown any growth in 2 days -Last CT scan without evidence of any intra-abdominal source of infection -Only source so far is right lung consolidation suspecting aspiration pneumonia -We will continue with IV antibiotic therapy   Chronic kidney disease stage III -Acute kidney injury after surgery.  This seem to be resolved.  -Appreciate hospitalist and nephrology evaluation and recommendations.   A. Fib -Intermittent rate control -Currently on therapeutic anticoagulation -Appreciate hospitalist evaluation and management   Encourage patient to get out of bed.  Will continue pain management.     Arnold Long, MD

## 2021-11-08 NOTE — Progress Notes (Signed)
PHARMACY - TOTAL PARENTERAL NUTRITION CONSULT NOTE   Indication: Prolonged ileus  Patient Measurements: Height: 5\' 6"  (167.6 cm) Weight: 63.6 kg (140 lb 3.4 oz) IBW/kg (Calculated) : 59.3 TPN AdjBW (KG): 56.2 Body mass index is 22.63 kg/m.  Assessment: 66 y.o. female with a past medical history of anemia, anxiety, aortic atherosclerosis, aortic valve stenosis, chronic atrial fibrillation, stage IV CKD, breast cancer with history of chemotherapy, history of radiotherapy, COPD, depression, diastolic dysfunction, GERD, hyperlipidemia, hypertension, lymphedema, small bowel obstruction, vitamin D deficiency who presented to the hospital for an colostomy reversal. Pharmacy has been consulted for TPN.   Glucose / Insulin: BG <180, 24-hr requirement 1 unit SSI Electrolytes: WNL Renal: SCr stable Hepatic: LFTs WNL Intake / Output; MIVF: +4.8 L GI Imaging: 1/18 abdomen: persistent small bowel ileus GI Surgeries / Procedures:  s/p ileostomy reversal  Central access: 1/18 TPN start date: 1/18  Nutritional Goals: Goal TPN rate is 55 mL/hr (provides 87.12 g of protein and 1710.32 kcals per day)  RD Assessment: Estimated Needs Total Energy Estimated Needs: 1700-1900kcal/day Total Protein Estimated Needs: 85-95g/day Total Fluid Estimated Needs: 1.2-1.5L/day  Current Nutrition:  NPO  Plan:  Continue TPN at goal rate 55 mL/hr (total volume including overfill 1420 mL) Nutritional components: Amino acids (as Clinisol 15%): 87.1 grams Dextrose: 237.6 grams Lipids (as SMOFlipids 20%): 55.4 grams  kCal: 1710/24h Electrolytes in TPN: Na 50 mEq/L, K 25 mEq/L, Ca 5 mEq/L, Mg 5 mEq/L, and Phos 8 mmol/L. Cl:Ac 1.13:1 Bicarb now WNL, will continue with slightly increased Cl:Ac ratio  Add standard MVI and trace elements to TPN continue SSI Sensitive to q6h SSI and adjust as needed  Monitor TPN labs on Mon/Thurs at minimum  Dallie Piles, PharmD, BCPS Clinical Pharmacist 11/08/2021 7:01 AM

## 2021-11-08 NOTE — Consult Note (Signed)
CARDIOLOGY CONSULT NOTE               Patient ID: Danielle [name] MRN: [id number] DOB/AGE: April 01, 1956 66 y.o.  Admit date: 10/23/2021 Referring Provider Neomia Glass, NP Primary Physician  Primary Cardiologist Dr. [name] Reason for Consultation Chest pain & pauses on telemetry early morning of 1/25  HPI: The patient is a 66 year old female with a past medical history significant for chronic atrial fibrillation, aortic stenosis, hypertension, hyperlipidemia, CKD stage III, anemia who was admitted to [facility] hospital 10/23/2021 for an ileostomy takedown with Dr. Windell Moment. She has had a prolonged hospital course with post-operative ileus requiring TPN and anemia requiring 2 units of PRBCs. Cardiology was consulted on 11/08/2021 for chest pain and multiple pauses on telemetry overnight.  The patient states early this morning between 0200-0300 she "felt like she had to burp but couldn't" and she had substernal chest pressure she described as "heartburn and stinging." She called for her nurse twice during the night for this sensation and was given dilaudid that resolved the pain. She denies radiation of the pain, association with SOB, diaphoresis, or dizziness at the time and has not had any more pain this morning with ambulation. She reports similar episodes of "pressure and heartburn" in the past relieved by tums.   She also had 4 instances overnight of pauses captured by telemetry, the first one occurring at 00:29 this morning. Notably she was given 12.$RemoveBefo'5mg'[id number]$  metoprolol at 2234 last night. She was in atrial fibrillation when these pauses were occurring and they lasted between 4-6 seconds each and the patient said she was likely asleep during them, denied any dizziness, presyncope, or syncope overnight or throughout the morning. Further review of telemetry showed the patient mostly in sinus rhythm during the day with occasional periods of atrial fibrillation with normal rate, with the exception of 1/24  around noon where she was in RVR with rates >140.   Recent labs are significant for, a Creatinine of 1.92 and eGFR of 28 - baseline from November was 31. Hgb 7.7 today. Troponins from overnight are minimally elevated to 38-34. EKG from overnight showed atrial fibrillation and anterior T wave inversions and rate 83  Review of systems complete and found to be negative unless listed above   Past Medical History:  Diagnosis Date   Anemia    Anxiety    Aortic atherosclerosis (HCC)    Aortic valve stenosis 02/10/2018   a.) TTE 02/10/2018: EF 55-60%: mild AS with MPG of 12 mmHg. b.) TTE 04/21/2018: EF 35-40%; mild AS with MPG 8 mmHg. c.) TTE 11/07/2019: EF 50-55%; mild AS with MPG 10 mmHg.   Arthritis    Atrial fibrillation (HCC)    a.) CHA2DS2-VASc = 4 (age, sex, HTN, aortic plaque). b.) Rate/rhythm maintained on oral carvedilol; chronically anticoagulated on dose reduced apixaban. c.)  Attempted deployment of LAA occlusive device on 01/11/2021; parameters failed and procedure aborted.   Breast cancer, left ([place]) 2000   a.) T2N1M0; ER/PR (+) --> Tx'd with total mastectomy, LN resection, XRT, and chemotherapy   Cancer of right lung ([place]) 07/30/2016   a.) adenocarcinoma; ALK, ROS1, PDL1, BRAF, EGFR all negative.   CKD (chronic kidney disease), stage IV (HCC)    COPD (chronic obstructive pulmonary disease) (HCC)    Dependence on supplemental oxygen    Depression    Diastolic dysfunction 69/79/4801   a.) TTE 02/10/2017: EF 55-60%; G2DD. b.) TTE 04/21/2018: EF 35-40%; mild LA dilation; mod MV regurgitation. c.) TTE 11/07/2019: EF 50-55%;  G1DD.   DOE (dyspnea on exertion)    GERD (gastroesophageal reflux disease)    Heart murmur    History of 2019 novel coronavirus disease (COVID-19) 10/14/2019   HLD (hyperlipidemia)    Hypertension    Long term current use of anticoagulant    a.) apixaban   Lymphedema    Personal history of chemotherapy    Personal history of radiation therapy    SBO (small  bowel obstruction) ([place]) 11/05/2020   Vitamin D deficiency     Past Surgical History:  Procedure Laterality Date   Breast Biospy Left    [facility]   BREAST SURGERY     COLONOSCOPY N/A 04/30/2018   Procedure: COLONOSCOPY;  Surgeon: Virgel Manifold, MD;  Location: [facility] ENDOSCOPY;  Service: Endoscopy;  Laterality: N/A;   COLONOSCOPY N/A 07/22/2018   Procedure: COLONOSCOPY;  Surgeon: Virgel Manifold, MD;  Location: [facility] ENDOSCOPY;  Service: Endoscopy;  Laterality: N/A;   COLONOSCOPY WITH PROPOFOL N/A 09/21/2021   Procedure: COLONOSCOPY WITH PROPOFOL;  Surgeon: Benjamine Sprague, DO;  Location: [facility] ENDOSCOPY;  Service: General;  Laterality: N/A;   DILATION AND CURETTAGE OF UTERUS     ELECTROMAGNETIC NAVIGATION BROCHOSCOPY Right 04/11/2016   Procedure: ELECTROMAGNETIC NAVIGATION BRONCHOSCOPY;  Surgeon: Vilinda Boehringer, MD;  Location: [facility] ORS;  Service: Cardiopulmonary;  Laterality: Right;   ESOPHAGOGASTRODUODENOSCOPY N/A 07/22/2018   Procedure: ESOPHAGOGASTRODUODENOSCOPY (EGD);  Surgeon: Virgel Manifold, MD;  Location: [facility] ENDOSCOPY;  Service: Endoscopy;  Laterality: N/A;   ESOPHAGOGASTRODUODENOSCOPY (EGD) WITH PROPOFOL N/A 05/07/2018   Procedure: ESOPHAGOGASTRODUODENOSCOPY (EGD) WITH PROPOFOL;  Surgeon: Lucilla Lame, MD;  Location[facility] ENDOSCOPY;  Service: Endoscopy;  Laterality: N/A;   ESOPHAGOGASTRODUODENOSCOPY (EGD) WITH PROPOFOL N/A 04/24/2019   Procedure: ESOPHAGOGASTRODUODENOSCOPY (EGD) WITH PROPOFOL;  Surgeon: Jonathon Bellows, MD;  Location: [facility] ENDOSCOPY;  Service: Gastroenterology;  Laterality: N/A;   ESOPHAGOGASTRODUODENOSCOPY (EGD) WITH PROPOFOL N/A 01/12/2020   Procedure: ESOPHAGOGASTRODUODENOSCOPY (EGD) WITH PROPOFOL;  Surgeon: Jonathon Bellows, MD;  Location: [facility] ENDOSCOPY;  Service: Gastroenterology;  Laterality: N/A;   ESOPHAGOGASTRODUODENOSCOPY (EGD) WITH PROPOFOL N/A 04/28/2020   Procedure: ESOPHAGOGASTRODUODENOSCOPY (EGD) WITH PROPOFOL;  Surgeon: Jonathon Bellows, MD;  Location:  [facility] ENDOSCOPY;  Service: Gastroenterology;  Laterality: N/A;   EUS N/A 05/07/2019   Procedure: FULL UPPER ENDOSCOPIC ULTRASOUND (EUS) RADIAL;  Surgeon: Jola Schmidt, MD;  Location: [facility] ENDOSCOPY;  Service: Endoscopy;  Laterality: N/A;   ILEOSCOPY N/A 07/22/2018   Procedure: ILEOSCOPY THROUGH STOMA;  Surgeon: Virgel Manifold, MD;  Location: [facility] ENDOSCOPY;  Service: Endoscopy;  Laterality: N/A;   ILEOSTOMY     ILEOSTOMY N/A 09/08/2018   Procedure: ILEOSTOMY REVISION POSSIBLE CREATION;  Surgeon: Herbert Pun, MD;  Location: [facility] ORS;  Service: General;  Laterality: N/A;   ILEOSTOMY CLOSURE N/A 08/15/2018   Procedure: DILATION OF ILEOSTOMY STRICTURE;  Surgeon: Herbert Pun, MD;  Location: [facility] ORS;  Service: General;  Laterality: N/A;   LAPAROTOMY Right 05/04/2018   Procedure: EXPLORATORY LAPAROTOMY right colectomy right and left ostomy;  Surgeon: Herbert Pun, MD;  Location: [facility] ORS;  Service: General;  Laterality: Right;   LEFT ATRIAL APPENDAGE OCCLUSION N/A 01/11/2021   Procedure: LEFT ATRIAL APPENDAGE OCCLUSION ([place]); ABORTED PROCEDURE WITHOUT DEVICE BEING IMPLANTED; Location: [facility]; Surgeon: Mylinda Latina, MD   LUNG BIOPSY     MASTECTOMY Left    2000, [facility]   ROTATOR CUFF REPAIR Right    [place] TAKEDOWN N/A 10/23/2021   Procedure: XI ROBOTIC ASSISTED ILEOSTOMY TAKEDOWN;  Surgeon: Herbert Pun, MD;  Location: [facility] ORS;  Service: General;  Laterality: N/A;  180 minutes for the surgery part please    Medications Prior to Admission  Medication Sig Dispense Refill Last Dose   acetaminophen (TYLENOL) 325 MG tablet Take 2 tablets (650 mg total) by mouth every 6 (six) hours as needed for mild pain (or Fever >/= 101). (Patient taking differently: Take 650 mg by mouth every 4 (four) hours as needed for moderate pain.)      albuterol (PROVENTIL HFA;VENTOLIN HFA) 108 (90 Base) MCG/ACT inhaler Inhale 2 puffs into the  lungs every 6 (six) hours as needed for wheezing or shortness of breath. 1 Inhaler 2 10/23/2021   amLODipine (NORVASC) 5 MG tablet Take 5 mg by mouth every morning.   10/23/2021   apixaban (ELIQUIS) 2.5 MG TABS tablet Take 2.5 mg by mouth 2 (two) times daily.   10/15/2021   calcium carbonate (TUMS - DOSED IN MG ELEMENTAL CALCIUM) 500 MG chewable tablet Chew 1 tablet by mouth daily.   Past Week   CALCIUM-VITAMIN D PO Take 1 tablet by mouth daily.   Past Week   carvedilol (COREG) 6.25 MG tablet Take 6.25 mg by mouth every morning.   10/23/2021   citalopram (CELEXA) 40 MG tablet Take 40 mg by mouth every morning.   10/22/2021   ferrous sulfate 325 (65 FE) MG tablet Take 1 tablet (325 mg total) by mouth 2 (two) times daily with a meal. 60 tablet 3 Past Week   Fluticasone-Umeclidin-Vilant 100-62.5-25 MCG/INH AEPB Inhale 1 puff into the lungs every morning.   10/22/2021   loperamide (IMODIUM) 2 MG capsule Take 2 mg by mouth as needed for diarrhea or loose stools.   Past Week   Multiple Vitamin (MULTIVITAMIN WITH MINERALS) TABS tablet Take 1 tablet by mouth daily. 30 tablet 1 Past Week   pantoprazole (PROTONIX) 40 MG tablet Take 1 tablet (40 mg total) by mouth 2 (two) times daily before a meal. 60 tablet 2 10/23/2021   sucralfate (CARAFATE) 1 GM/10ML suspension Take 10 mLs (1 g total) by mouth 4 (four) times daily. (Patient taking differently: Take 1 g by mouth 4 (four) times daily as needed (acid).) 1200 mL 2 10/22/2021   torsemide (DEMADEX) 20 MG tablet Take 20 mg by mouth in the morning and at bedtime.   10/22/2021   zolpidem (AMBIEN) 5 MG tablet Take 5 mg by mouth at bedtime as needed for sleep.   10/22/2021   OXYGEN Inhale 2 L into the lungs as needed (shortness of breath).      Social History   Socioeconomic History   Marital status: Divorced    Spouse name: Not on file   Number of children: 3   Years of education: Not on file   Highest education level: Not on file  Occupational History   Occupation: Retired    Tobacco Use   Smoking status: Former    Packs/day: 0.50    Years: 20.00    Pack years: 10.00    Types: Cigarettes    Quit date: 07/02/2012    Years since quitting: 9.3   Smokeless tobacco: Current    Types: Snuff   Tobacco comments:    quit 2014  Vaping Use   Vaping Use: Never used  Substance and Sexual Activity   Alcohol use: Yes    Comment: Occasionally beer   Drug use: No   Sexual activity: Not Currently  Other Topics Concern   Not on file  Social History Narrative   Not on file  Social Determinants of Health   Financial Resource Strain: Not on file  Food Insecurity: Not on file  Transportation Needs: Not on file  Physical Activity: Not on file  Stress: Not on file  Social Connections: Not on file  Intimate Partner Violence: Not on file    Family History  Problem Relation Age of Onset   Breast cancer Mother 10   Cancer Mother        Breast    Cirrhosis Father    Breast cancer Paternal Aunt 44   Cancer Maternal Aunt        Breast       Review of systems complete and found to be negative unless listed above    PHYSICAL EXAM General: Pleasant elderly black female, in no acute distress. Sitting at incline in bed.  HEENT:  Normocephalic and atraumatic. Neck:  No JVD.  Lungs: Normal respiratory effort on 4L by [place]. Poor air movement into bases bilaterally without crackles.  Heart: HRRR . Normal S1 and S2 without gallops. 3/6 systolic murmur best heart at RUSB. Radial & DP pulses 2+ bilaterally. Abdomen: Non-distended appearing.  Msk: Normal strength and tone for age. Extremities: No clubbing, cyanosis. Trace Right lower extremity edema.  Neuro: Alert and oriented X 3. Psych:  Mood appropriate, affect congruent.   Labs:   Lab Results  Component Value Date   WBC 12.9 (H) 11/06/2021   HGB 7.7 (L) 11/08/2021   HCT 25.3 (L) 11/07/2021   MCV 96.2 11/06/2021   PLT 305 11/06/2021    Recent Labs  Lab 11/06/21 0443 11/07/21 0409 11/08/21 0337  NA 138    < > 136  K 4.1   < > 4.0  CL 102   < > 104  CO2 27   < > 23  BUN 57*   < > 60*  CREATININE 2.03*   < > 1.92*  CALCIUM 8.9   < > 8.7*  PROT 6.7  --   --   BILITOT 2.2*  --   --   ALKPHOS 50  --   --   ALT 9  --   --   AST 16  --   --   GLUCOSE 120*   < > 109*   < > = values in this interval not displayed.   Lab Results  Component Value Date   CKTOTAL 207 06/26/2013   CKMB 1.9 06/27/2013   TROPONINI <0.03 12/24/2018    Lab Results  Component Value Date   CHOL 152 01/21/2020   Lab Results  Component Value Date   HDL 87 01/21/2020   Lab Results  Component Value Date   LDLCALC 53 01/21/2020   Lab Results  Component Value Date   TRIG 95 11/06/2021   TRIG 57 11/02/2021   TRIG 60 01/21/2020   Lab Results  Component Value Date   CHOLHDL 1.7 01/21/2020   No results found for: LDLDIRECT    Radiology: CT ABDOMEN PELVIS WO CONTRAST  Result Date: 11/05/2021 CLINICAL DATA:  Sepsis, fever, postoperative, rule out intra-abdominal abscess EXAM: CT ABDOMEN AND PELVIS WITHOUT CONTRAST TECHNIQUE: Multidetector CT imaging of the abdomen and pelvis was performed following the standard protocol without IV contrast. Oral enteric contrast was administered. RADIATION DOSE REDUCTION: This exam was performed according to the departmental dose-optimization program which includes automated exposure control, adjustment of the mA and/or kV according to patient size and/or use of iterative reconstruction technique. COMPARISON:  10/27/2021 FINDINGS: Lower chest: Small right pleural effusion associated  atelectasis or consolidation. Three-vessel coronary artery calcifications. Hepatobiliary: No solid liver abnormality is seen. No gallstones, gallbladder wall thickening, or biliary dilatation. Pancreas: Unremarkable. No pancreatic ductal dilatation or surrounding inflammatory changes. Spleen: Normal in size without significant abnormality. Adrenals/Urinary Tract: Adrenal glands are unremarkable. Kidneys are  normal, without renal calculi, solid lesion, or hydronephrosis. Bladder is unremarkable. Stomach/Bowel: Stomach is within normal limits. The mid small bowel is diffusely gas and contrast filled, and mildly distended, largest loops measuring 4.1 cm. Status post right hemicolectomy. Descending and sigmoid diverticula. Vascular/Lymphatic: Aortic atherosclerosis. No enlarged abdominal or pelvic lymph nodes. Reproductive: No mass or other significant abnormality. Other: Anasarca. Persistent, although improved, scattered subcutaneous emphysema about the abdominal wall (series 2, image 38). Unchanged subcutaneous fluid collections of the left and right ventral abdomen, collection on the left measuring 3.4 x 2.2 cm (series 2, image 36), collection on the right measuring 3.5 x 2.0 cm (series 2, image 43). No ascites. Musculoskeletal: No acute or significant osseous findings. IMPRESSION: 1. Unchanged subcutaneous fluid collections of the left and right ventral abdomen, collection on the left measuring 3.4 x 2.2 cm, collection on the right measuring 3.5 x 2.0 cm. These most likely reflect postoperative hematoma or seroma, although presence or absence of infection within this fluid is not established by CT. 2. Status post right hemicolectomy. 3. The mid small bowel is diffusely gas and contrast filled, and mildly distended, largest loops measuring 4.1 cm. Findings are most consistent with postoperative ileus. 4. Right pleural effusion and anasarca. 5. Coronary artery disease. Aortic Atherosclerosis (ICD10-I70.0). Electronically Signed   By: Delanna Ahmadi M.D.   On: 11/05/2021 17:34   CT ABDOMEN PELVIS WO CONTRAST  Result Date: 10/27/2021 CLINICAL DATA:  Postop day 4 from ileostomy reversal. Vomiting. Evaluate for anastomotic leak. EXAM: CT ABDOMEN AND PELVIS WITHOUT CONTRAST TECHNIQUE: Multidetector CT imaging of the abdomen and pelvis was performed following the standard protocol without IV contrast. RADIATION DOSE  REDUCTION: This exam was performed according to the departmental dose-optimization program which includes automated exposure control, adjustment of the mA and/or kV according to patient size and/or use of iterative reconstruction technique. COMPARISON:  Abdominopelvic CT 11/05/2020.  Radiographs 10/26/2021. FINDINGS: Study was performed with the patient in the right lateral decubitus position. Rectal contrast was administered. Lower chest: Apparent right middle lobe collapse with a small amount of adjacent pleural fluid, incompletely visualized. Mild dependent atelectasis in both lower lobes. There is atherosclerosis of the aorta and coronary arteries. Hepatobiliary: Stable low-density liver lesions, likely cysts based on stability. Stable mild biliary dilatation status post cholecystectomy. Pancreas: Unremarkable. No pancreatic ductal dilatation or surrounding inflammatory changes. Spleen: Normal in size without focal abnormality. Adrenals/Urinary Tract: Both adrenal glands appear normal. Both kidneys demonstrate mild perinephric soft tissue stranding, similar to previous study. No evidence of urinary tract calculus or hydronephrosis. The bladder appears unremarkable for its degree of distention. Stomach/Bowel: No oral contrast was administered. Rectal contrast has passed retrograde through the colon and through the ileocolonic anastomosis at the level of the mid transverse colon status post right hemicolectomy. There is retrograde filling of the distal small bowel. No anastomotic leak. The stomach and small bowel are fluid-filled and mildly distended. No evidence of bowel wall thickening or pneumatosis. Vascular/Lymphatic: There are no enlarged abdominal or pelvic lymph nodes. Diffuse aortic and branch vessel atherosclerosis. Reproductive: Stable appearance of the uterus and adnexa. No adnexal mass. Tubal ligation clips noted. Other: As seen on the postoperative radiographs, there is a large amount of soft  tissue  emphysema throughout the anterior abdominal wall which extends from the perineum into the presternal soft tissues. There are small focal fluid collections within the right and left anterior abdominal wall, likely related to recent laparoscopic procedure. No free intraperitoneal air, ascites or focal extraluminal fluid collections identified within the peritoneal cavity. Musculoskeletal: No acute or significant osseous findings. Stable chronic mild superior endplate compression fractures at L3 and L5. IMPRESSION: 1. Status post recent ileostomy reversal and ileocolonic anastomosis. The anastomosis is patent, without leak. 2. Nonspecific gastric and proximal small bowel distension without focal transition point, likely related to a postoperative ileus. Recommend radiographic follow-up. Patient may benefit from a nasogastric tube. 3. As seen on postoperative radiographs, there is extensive soft tissue emphysema throughout the abdominal wall which is likely related to the laparoscopic procedure. Soft tissue infection should be excluded clinically. Small fluid collections in the anterior abdominal wall are attributed to the procedure, and no unexpected collections are identified. There is no pneumoperitoneum or abnormal intra-abdominal fluid collection. 4. Atelectasis at both lung bases with apparent partial right middle lobe collapse. 5. Extensive Aortic Atherosclerosis (ICD10-I70.0). Electronically Signed   By: Richardean Sale M.D.   On: 10/27/2021 16:28   DG Chest 1 View  Result Date: 10/26/2021 CLINICAL DATA:  Hypotension EXAM: CHEST  1 VIEW COMPARISON:  11/16/2020, 06/05/2020, PET CT 12/01/2019, CT chest 11/06/2019, 11/06/2019 FINDINGS: Mild cardiomegaly with central vascular congestion. Small pleural effusions and diffuse increased interstitial opacity likely due to edema. Aortic atherosclerosis. No pneumothorax. Irregular opacity with mild distortion and nodularity in the right upper lobe and right hilus  similar compared to intermittent previous exams and presumably due to scarring. IMPRESSION: 1. Cardiomegaly with vascular congestion, small pleural effusions and mild diffuse interstitial edema 2. Irregular and nodular opacities in the right hilus and upper lobe present on previous exams and probably due to scarring Electronically Signed   By: Donavan Foil M.D.   On: 10/26/2021 17:54   DG Abd 1 View  Result Date: 11/03/2021 CLINICAL DATA:  NG tube placement EXAM: ABDOMEN - 1 VIEW COMPARISON:  11/03/2021 11:05 a.m. FINDINGS: Advancement of NG tube with tip and side port below the GE junction. Redemonstrated small bowel dilatation, not significantly changed from the prior exam. The stomach is not distended. The lower abdomen is not included for evaluation. IMPRESSION: Interval advancement of nasogastric tube, with tip and side port overlying the stomach. Overall unchanged small bowel dilatation in the imaged portion of the abdomen. Electronically Signed   By: Merilyn Baba M.D.   On: 11/03/2021 16:01   DG Abd 1 View  Result Date: 10/28/2021 CLINICAL DATA:  NG tube placement EXAM: ABDOMEN - 1 VIEW COMPARISON:  10/26/2021 FINDINGS: Limited radiograph of the lower chest and upper abdomen was obtained for the purposes of enteric tube localization. Enteric tube is seen coursing below the diaphragm with distal tipterminating within the proximal stomach. Side port terminates within the distal esophagus. Similar degree of gaseous distension of large and small bowel within the visualized abdomen. IMPRESSION: Enteric tube with distal tip terminating within the proximal stomach, side port terminating within the distal esophagus. Recommend advancement 7-10 cm. Electronically Signed   By: Davina Poke D.O.   On: 10/28/2021 15:48   DG Abd 1 View  Result Date: 10/26/2021 CLINICAL DATA:  Hypotension history of ileostomy take down EXAM: ABDOMEN - 1 VIEW COMPARISON:  11/05/2020 FINDINGS: Clips in the pelvis. Moderate  air distension of the stomach. Air-filled dilated small bowel up to 3.5  cm in the central abdomen. Extensive mottled appearance of the abdominal soft tissues presumably due to soft tissue emphysema related to recent laparoscopic procedure. IMPRESSION: 1. Air distension of stomach and small bowel either due to ileus or possible obstruction. 2. Diffuse mottled appearance of soft tissues felt consistent with soft tissue emphysema likely due to recent laparoscopic procedure Electronically Signed   By: Donavan Foil M.D.   On: 10/26/2021 17:51   US RENAL  Result Date: 10/25/2021 CLINICAL DATA:  Acute kidney injury EXAM: RENAL / URINARY TRACT ULTRASOUND COMPLETE COMPARISON:  CT 11/05/2020 FINDINGS: Right Kidney: Renal measurements: 8.9 x 4.1 x 4.2 cm = volume: 79.8 mL. Cortex slightly echogenic. No mass or hydronephrosis Left Kidney: Renal measurements: 7.5 x 4.4 x 4 cm = volume: 88.5 mL. Cortex slightly echogenic. No mass or hydronephrosis. Bladder: Appears normal for degree of bladder distention. Other: None. IMPRESSION: Slight increased cortical echogenicity suggesting medical renal disease. No hydronephrosis. Electronically Signed   By: Donavan Foil M.D.   On: 10/25/2021 16:28   DG Chest Port 1 View  Result Date: 11/05/2021 CLINICAL DATA:  Fever. EXAM: PORTABLE CHEST 1 VIEW COMPARISON:  Chest radiograph 10/26/2021. FINDINGS: Right upper extremity PICC line tip projects over the superior vena cava. Enteric tube is present. The side port is at the level of the distal esophagus. Stable enlarged cardiac and mediastinal contours. Similar-appearing distortion and consolidation within the mid right lung. Bibasilar atelectasis, right-greater-than-left. Probable small right pleural effusion. No pneumothorax. IMPRESSION: Enteric tube side-port at the distal esophagus, recommend advancement. Right upper extremity PICC line tip projects over the superior vena cava. Similar-appearing irregular and nodular areas of  consolidation within the right mid lung. These results will be called to the ordering clinician or representative by the Radiologist Assistant, and communication documented in the PACS or [employer]. Electronically Signed   By: Lovey Newcomer M.D.   On: 11/05/2021 08:04   DG Abd 2 Views  Result Date: 11/06/2021 CLINICAL DATA:  Abdominal pain EXAM: ABDOMEN - 2 VIEW COMPARISON:  Abdominal x-ray 11/03/2021 FINDINGS: Enteric tube identified with the tip in the lower esophagus. Mildly distended loops of small bowel are again seen throughout the abdomen measuring up to 3.3 cm in diameter. No free air identified on the decubitus view. No suspicious calcifications identified. IMPRESSION: 1. Enteric tube tip is in the lower esophagus, advancement recommended. 2. Mildly distended loops of bowel throughout the abdomen which could be seen with ileus or obstruction. Continued follow-up recommended. Electronically Signed   By: Ofilia Neas M.D.   On: 11/06/2021 10:42   DG Abd 2 Views  Result Date: 11/03/2021 CLINICAL DATA:  Abdominal distention EXAM: ABDOMEN - 2 VIEW COMPARISON:  Previous studies including the examination of 11/02/2021 FINDINGS: There is interval worsening of small bowel dilation measuring up to 4.9 cm in diameter. Tip enteric tube is noted at the gastroesophageal junction pointing cephalad. There is no distention of stomach. There is interval passage of contrast from the colon. There is evidence of previous tubal ligation. IMPRESSION: There is interval increase in small bowel dilation suggesting high-grade small bowel obstruction. Tip of enteric tube is at the gastroesophageal junction pointing cephalad. NG tube should be advanced 5-10 cm to place the tip and side port within the stomach. Electronically Signed   By: Elmer Picker M.D.   On: 11/03/2021 12:36   DG Abd 2 Views  Result Date: 10/31/2021 CLINICAL DATA:  Ileus following gastrointestinal surgery, colostomy takedown 10/23/2021  EXAM: ABDOMEN - 2 VIEW  COMPARISON:  10/28/2021 FINDINGS: Nasogastric tube in proximal stomach. Persistent dilatation of small bowel loops in the abdomen without wall thickening. Small amount retained contrast in colon. Levoconvex lumbar scoliosis. Foci of soft tissue gas at RIGHT lateral abdominal wall consistent with preceding surgery. Atelectasis at RIGHT lung base. IMPRESSION: Persistent small bowel ileus. Electronically Signed   By: Lavonia Dana M.D.   On: 10/31/2021 10:59   DG Abd Portable 1V-Small Bowel Obstruction Protocol-initial, 8 hr delay  Result Date: 11/02/2021 CLINICAL DATA:  8 hour follow-up small-bowel delay EXAM: PORTABLE ABDOMEN - 1 VIEW COMPARISON:  Film from the previous day. FINDINGS: Gastric catheter is noted within the stomach. Contrast material administered now lies within the dilated small bowel as well as within the colon. These changes are consistent with a partial small bowel obstruction. No free air is noted. IMPRESSION: Passage of contrast into the colon consistent with a partial small bowel obstruction. Electronically Signed   By: Inez Catalina M.D.   On: 11/02/2021 03:13   DG Abd Portable 1V  Result Date: 11/01/2021 CLINICAL DATA:  Enteric catheter placement EXAM: PORTABLE ABDOMEN - 1 VIEW COMPARISON:  10/31/2021 FINDINGS: Frontal view of the lower chest and upper abdomen demonstrates enteric catheter tip and side port projecting over the gastric fundus. Right-sided PICC tip overlies atriocaval junction. Cardiac silhouette is enlarged. Small right pleural effusion. Stable vascular congestion. Stable distended gas-filled loops of small bowel, likely ileus given recent surgery. IMPRESSION: 1. Enteric catheter projecting over gastric fundus. 2. Stable small bowel dilatation consistent with ileus. Electronically Signed   By: Randa Ngo M.D.   On: 11/01/2021 16:02   ECHOCARDIOGRAM COMPLETE  Result Date: 10/27/2021    ECHOCARDIOGRAM REPORT   Patient Name:   Danielle [name] [facility]  Date of Exam: 10/27/2021 Medical Rec #:  [id number]       Height:       66.0 in Accession #:    [id number]      Weight:       124.0 lb Date of Birth:  [birth year]       BSA:          93.632 m Patient Age:    53 years        BP:           144/84 mmHg Patient Gender: F               HR:           95 bpm. Exam Location:  [facility] Procedure: 2D Echo, Cardiac Doppler and Color Doppler Indications:     Elevated troponin  History:         Patient has prior history of Echocardiogram examinations, most                  recent 11/07/2019. COPD, Arrythmias:Atrial Fibrillation,                  Signs/Symptoms:Murmur; Risk Factors:Hypertension.  Sonographer:     Sherrie Sport Referring Phys:  Gandy Diagnosing Phys: Donnelly Angelica IMPRESSIONS  1. Left ventricular ejection fraction, by estimation, is 60 to 65%. The left ventricle has normal function. The left ventricle has no regional wall motion abnormalities. Left ventricular diastolic parameters are consistent with Grade I diastolic dysfunction (impaired relaxation).  2. Right ventricular systolic function is normal. The right ventricular size is mildly enlarged.  3. Right atrial size was mildly dilated.  4. The mitral valve is normal in structure. Moderate mitral valve regurgitation.  No evidence of mitral stenosis.  5. Tricuspid valve regurgitation is moderate.  6. The aortic valve is calcified. Aortic valve regurgitation is not visualized. Mild aortic valve stenosis.  7. The inferior vena cava is normal in size with greater than 50% respiratory variability, suggesting right atrial pressure of 3 mmHg. Conclusion(s)/Recommendation(s): CALCIFIED AORTIC VALVE WITH MILD AS. VMax 2.1 m/s. Dimensionless index 0.4. FINDINGS  Left Ventricle: Left ventricular ejection fraction, by estimation, is 60 to 65%. The left ventricle has normal function. The left ventricle has no regional wall motion abnormalities. The left ventricular internal cavity size was normal in size. There is  no left  ventricular hypertrophy. Left ventricular diastolic parameters are consistent with Grade I diastolic dysfunction (impaired relaxation). Right Ventricle: The right ventricular size is mildly enlarged. No increase in right ventricular wall thickness. Right ventricular systolic function is normal. Left Atrium: Left atrial size was normal in size. Right Atrium: Right atrial size was mildly dilated. Pericardium: There is no evidence of pericardial effusion. Mitral Valve: The mitral valve is normal in structure. Moderate mitral valve regurgitation. No evidence of mitral valve stenosis. MV peak gradient, 3.1 mmHg. The mean mitral valve gradient is 1.0 mmHg. Tricuspid Valve: The tricuspid valve is normal in structure. Tricuspid valve regurgitation is moderate . No evidence of tricuspid stenosis. Aortic Valve: The aortic valve is calcified. Aortic valve regurgitation is not visualized. Mild aortic stenosis is present. Aortic valve mean gradient measures 9.4 mmHg. Aortic valve peak gradient measures 16.8 mmHg. Aortic valve area, by VTI measures 1.21 cm. Pulmonic Valve: The pulmonic valve was not well visualized. Pulmonic valve regurgitation is not visualized. No evidence of pulmonic stenosis. Aorta: The aortic root is normal in size and structure. Venous: The inferior vena cava is normal in size with greater than 50% respiratory variability, suggesting right atrial pressure of 3 mmHg. IAS/Shunts: No atrial level shunt detected by color flow Doppler.  LEFT VENTRICLE PLAX 2D LVIDd:         4.80 cm   Diastology LVIDs:         3.00 cm   LV e' medial:    5.87 cm/s LV PW:         1.10 cm   LV E/e' medial:  13.9 LV IVS:        0.75 cm   LV e' lateral:   12.40 cm/s LVOT diam:     2.00 cm   LV E/e' lateral: 6.6 LV SV:         45 LV SV Index:   28 LVOT Area:     3.14 cm  RIGHT VENTRICLE RV Basal diam:  3.80 cm RV S prime:     12.20 cm/s TAPSE (M-mode): 1.8 cm LEFT ATRIUM             Index        RIGHT ATRIUM           Index LA diam:         3.20 cm 1.96 cm/m   RA Area:     19.30 cm LA Vol (A2C):   58.8 ml 36.03 ml/m  RA Volume:   63.90 ml  39.15 ml/m LA Vol (A4C):   38.7 ml 23.71 ml/m LA Biplane Vol: 49.1 ml 30.08 ml/m  AORTIC VALVE                     PULMONIC VALVE AV Area (Vmax):    1.04 cm      PV Vmax:  0.59 m/s AV Area (Vmean):   0.91 cm      PV Vmean:       35.200 cm/s AV Area (VTI):     1.21 cm      PV VTI:         0.109 m AV Vmax:           205.20 cm/s   PV Peak grad:   1.4 mmHg AV Vmean:          140.200 cm/s  PV Mean grad:   1.0 mmHg AV VTI:            0.373 m       RVOT Peak grad: 4 mmHg AV Peak Grad:      16.8 mmHg AV Mean Grad:      9.4 mmHg LVOT Vmax:         67.90 cm/s LVOT Vmean:        40.500 cm/s LVOT VTI:          0.144 m LVOT/AV VTI ratio: 0.39  AORTA Ao Root diam: 3.17 cm MITRAL VALVE               TRICUSPID VALVE MV Area (PHT): 4.86 cm    TR Peak grad:   52.4 mmHg MV Area VTI:   3.01 cm    TR Vmax:        362.00 cm/s MV Peak grad:  3.1 mmHg MV Mean grad:  1.0 mmHg    SHUNTS MV Vmax:       0.88 m/s    Systemic VTI:  0.14 m MV Vmean:      55.0 cm/s   Systemic Diam: 2.00 cm MV Decel Time: 156 msec    Pulmonic VTI:  0.157 m MV E velocity: 81.40 cm/s MV A velocity: 51.40 cm/s MV E/A ratio:  1.58 Donnelly Angelica Electronically signed by Donnelly Angelica Signature Date/Time: 10/27/2021/4:07:10 PM    Final    [place] EKG SITE RITE  Result Date: 11/01/2021 If Site Rite image not attached, placement could not be confirmed due to current cardiac rhythm.   ECHO 10/27/21 LVEF 02-54%, grade 1 diastolic dysfunction, mild AS  TELEMETRY reviewed by me: Early morning of 1/25 revealed 4 episodes of pauses lasting 4-6 seconds apiece while patient was in atrial fibrillation, during the day, mostly sinus rhythm with occasional periods of atrial fibrillation with normal rate, with the exception of 1/24 around noon where she was in RVR with rates >140.   EKG reviewed by me: atrial fibrillation and anterior T wave inversions and rate  83  ASSESSMENT AND PLAN:  The patient is a 66 year old female with a past medical history significant for chronic atrial fibrillation, mild aortic stenosis, HFpEF (10/27/21 LVEF 27-06%, grade 1 diastolic dysfunction), hypertension, hyperlipidemia, CKD stage III, anemia, and history of Right colectomy who was admitted to [facility] hospital 10/23/2021 for an ileostomy takedown with Dr. Windell Moment. She has had a prolonged hospital course with post-operative ileus requiring TPN, anemia requiring 2 units of PRBCs, and aspiration pneumonia. Cardiology was consulted on 11/08/2021 for chest pain and a 6-second pause on telemetry overnight.  #Atypical chest pain The patient reported two episodes of "feeling like she had to burp but couldn't" that became substernal chest pressure, "stinging and heartburn like" which resolved dilaudid. Troponins trended 38-34 and EKG was without acute ischemic changes.  She does have risk factors for CAD including hypertension, hyperlipidemia, history of tobacco use, but further ischemic workup can be decided upon on  an outpatient basis if needed by her primary cardiologist, Dr. [name].  -Echo performed on 1/13 was without WMA and noted pt's mild AS.  -no further cardiac diagnostics at this time, will continue to monitor for any recurrence in chest pain during her hospitalization.   #Bradycardia, 4 episodes of 4 to 6 seconds pauses on telemetry #Paroxysmal atrial fibrillation Metoprolol succinate 12.5mg  was started on 1/24 at 2234 because her HR increased up to 140 with ambulation earlier that day.  -Recommend changing dosing of metoprolol to short acting PRN use during the day for sustained HR >130 if she is in AF with RVR  -continue diltiazem CD 240mg  once daily with hold parameters for BP and HR -monitor on telemetry for further bradycardic episodes and pauses, no need for permanent pacemaker at this time -continue anticoagulation on Eliquis 2.5 mg twice daily with careful  monitoring of her H/H. CHA2DS2-VASc 4.  #mild aortic stenosis #HFpEF (10/27/21 LVEF 22-84%, grade 1 diastolic dysfunction) #HTN #hyperlipidemia -agree with PRN lasix while inpatient and can likely resume home torsemide at discharge.  -home meds include diltiazem CD 240mg , torsemide 20mg  PO daily, and amlodipine 5mg  once daily.    This patient's plan of care was discussed and created with Dr. Lujean Amel and he is in agreement.  Signed: Tristan Schroeder , PA-C 11/08/2021, 10:10 AM

## 2021-11-08 NOTE — Progress Notes (Signed)
° °      CROSS COVER NOTE  NAME: Danielle [name] MRN: [id number] DOB : 1956-08-07    Overnight patient reported 7 out of 10 chest pain that she describes as burning and tightness. This chest pain occurred after mild exertion repositioning in bed.  Also of note RN reported that the patient's telemetry showed a 6-second asystolic pause, at that time patient was asymptomatic and blood pressure stable. EKG obtained and reviewed at bedside, EKG is without ischemic changes.  Troponin 38--> 34 tonight. Cardiology consulted.   Neomia Glass MHA, MSN, FNP-BC Nurse Practitioner [facility] Pager [phone]

## 2021-11-09 ENCOUNTER — Inpatient Hospital Stay: Payer: Medicare Other

## 2021-11-09 LAB — COMPREHENSIVE METABOLIC PANEL
ALT: 18 U/L (ref 0–44)
AST: 21 U/L (ref 15–41)
Albumin: 2.7 g/dL — ABNORMAL LOW (ref 3.5–5.0)
Alkaline Phosphatase: 60 U/L (ref 38–126)
Anion gap: 7 (ref 5–15)
BUN: 69 mg/dL — ABNORMAL HIGH (ref 8–23)
CO2: 24 mmol/L (ref 22–32)
Calcium: 8.4 mg/dL — ABNORMAL LOW (ref 8.9–10.3)
Chloride: 103 mmol/L (ref 98–111)
Creatinine, Ser: 2.21 mg/dL — ABNORMAL HIGH (ref 0.44–1.00)
GFR, Estimated: 24 mL/min — ABNORMAL LOW (ref >60)
Glucose, Bld: 108 mg/dL — ABNORMAL HIGH (ref 70–99)
Potassium: 4.4 mmol/L (ref 3.5–5.1)
Sodium: 134 mmol/L — ABNORMAL LOW (ref 135–145)
Total Bilirubin: 0.7 mg/dL (ref 0.3–1.2)
Total Protein: 6.8 g/dL (ref 6.5–8.1)

## 2021-11-09 LAB — CBC
HCT: 24.5 % — ABNORMAL LOW (ref 36.0–46.0)
Hemoglobin: 7.7 g/dL — ABNORMAL LOW (ref 12.0–15.0)
MCH: 30.9 pg (ref 26.0–34.0)
MCHC: 31.4 g/dL (ref 30.0–36.0)
MCV: 98.4 fL (ref 80.0–100.0)
Platelets: 276 10*3/uL (ref 150–400)
RBC: 2.49 MIL/uL — ABNORMAL LOW (ref 3.87–5.11)
RDW: 15.9 % — ABNORMAL HIGH (ref 11.5–15.5)
WBC: 8.4 10*3/uL (ref 4.0–10.5)
nRBC: 0.2 % (ref 0.0–0.2)

## 2021-11-09 LAB — MAGNESIUM: Magnesium: 2.1 mg/dL (ref 1.7–2.4)

## 2021-11-09 LAB — URINE CULTURE: Culture: NO GROWTH

## 2021-11-09 LAB — GLUCOSE, CAPILLARY
Glucose-Capillary: 135 mg/dL — ABNORMAL HIGH (ref 70–99)
Glucose-Capillary: 127 mg/dL — ABNORMAL HIGH (ref 70–99)

## 2021-11-09 LAB — HAPTOGLOBIN: Haptoglobin: 268 mg/dL (ref 37–355)

## 2021-11-09 LAB — PHOSPHORUS: Phosphorus: 5 mg/dL — ABNORMAL HIGH (ref 2.5–4.6)

## 2021-11-09 MED ORDER — TRACE MINERALS CU-MN-SE-ZN 300-55-60-3000 MCG/ML IV SOLN
INTRAVENOUS | Status: AC
  Filled 2021-11-09: qty 264

## 2021-11-09 MED ORDER — FUROSEMIDE 10 MG/ML IJ SOLN
40.0000 mg | Freq: Every day | INTRAMUSCULAR | Status: DC
  Administered 2021-11-10: 40 mg via INTRAVENOUS
  Filled 2021-11-09: qty 4

## 2021-11-09 MED ORDER — FAMOTIDINE 20 MG PO TABS
20.0000 mg | ORAL_TABLET | Freq: Every day | ORAL | Status: DC
  Administered 2021-11-09 – 2021-11-24 (×16): 20 mg via ORAL
  Filled 2021-11-09 (×16): qty 1

## 2021-11-09 MED ORDER — METOCLOPRAMIDE HCL 5 MG/ML IJ SOLN
5.0000 mg | Freq: Three times a day (TID) | INTRAMUSCULAR | Status: DC
  Administered 2021-11-09 – 2021-11-11 (×6): 5 mg via INTRAVENOUS
  Filled 2021-11-09 (×6): qty 2

## 2021-11-09 NOTE — Progress Notes (Signed)
Patient ID: Danielle [name], female   DOB: 08-Jul-1956, 66 y.o.   MRN: [id number]     [place] [facility] Day(s): 17.   Interval History: Patient seen and examined, no acute events or new complaints overnight. Patient reports feeling short of breath when getting out of bed.  Patient now without any abdominal complaint.  She endorses that she continues to have a bowel movement and passing a lot of gas.  She denies abdominal pain.  No new complaints shortness of breath when she got out of bed to the chair or commode.  Today she denies chest pain.  Vital signs in last 24 hours: [min-max] current  Temp:  [97.6 F (36.4 C)-98.4 F (36.9 C)] 97.6 F (36.4 C) (01/26 0429) Pulse Rate:  [58-97] 58 (01/26 0429) Resp:  [16-20] 16 (01/26 0429) BP: (92-153)/(62-117) 92/62 (01/26 0429) SpO2:  [94 %-98 %] 94 % (01/26 0429) Weight:  [66.4 kg-66.5 kg] 66.4 kg (01/26 0500)     Height: 5\' 6"  (167.6 cm) Weight: 66.4 kg BMI (Calculated): 23.64   Physical Exam:  Constitutional: alert, cooperative and no distress  Respiratory: breathing non-labored at rest at 4 L of nasal cannula Cardiovascular: Irregular rhythm Gastrointestinal: soft, non-tender, and non-distended.  Wounds are dry and clean with serosanguineous output  Labs:  CBC Latest Ref Rng & Units 11/08/2021 11/07/2021 11/07/2021  WBC 4.0 - 10.5 K/uL - - -  Hemoglobin 12.0 - 15.0 g/dL 7.7(L) 7.9(L) 6.6(L)  Hematocrit 36.0 - 46.0 % - 25.3(L) -  Platelets 150 - 400 K/uL - - -   CMP Latest Ref Rng & Units 11/09/2021 11/08/2021 11/07/2021  Glucose 70 - 99 mg/dL 108(H) 109(H) 101(H)  BUN 8 - 23 mg/dL 69(H) 60(H) 59(H)  Creatinine 0.44 - 1.00 mg/dL 2.21(H) 1.92(H) 1.96(H)  Sodium 135 - 145 mmol/L 134(L) 136 137  Potassium 3.5 - 5.1 mmol/L 4.4 4.0 4.1  Chloride 98 - 111 mmol/L 103 104 104  CO2 22 - 32 mmol/L 24 23 25   Calcium 8.9 - 10.3 mg/dL 8.4(L) 8.7(L) 8.8(L)  Total Protein 6.5 - 8.1 g/dL 6.8 - -  Total Bilirubin 0.3 - 1.2 mg/dL 0.7 - -   Alkaline Phos 38 - 126 U/L 60 - -  AST 15 - 41 U/L 21 - -  ALT 0 - 44 U/L 18 - -    Imaging studies: No new pertinent imaging studies   Assessment/Plan:  66 y.o. female with history of an ileostomy and mucous fistula 17 Day Post-Op s/p ileostomy reversal, complicated by pertinent comorbidities including COPD, atrial fibrillation on anticoagulation, hypertension, chronic kidney disease stage III.   Status post ileostomy reversal -Patient with postoperative ileus resolving. -Tolerated liquid diet.  She continued having bowel movement and passing gas.  Will advance to soft diet. -We will start decreasing Reglan Reglan -We will discontinue Dulcolax suppositories -We will contact to start discontinuing TPN   Fever -No fever in last 24 hours -Urinalysis without white blood cells or bacteria.  Urine culture in process -Blood culture has not shown any growth in 4 days -Last CT scan without evidence of any intra-abdominal source of infection -Only source so far is right lung consolidation suspecting aspiration pneumonia -We will continue with IV antibiotic therapy -No complaint of shortness of breath in the last 2 days on exertion.  Cardiology evaluated patient and assess no sign of acute ischemic event.  Will order chest x-ray for evaluation of possible pleural effusion as patient has received multiple PRBC transfusions  during the last week.   Chronic kidney disease stage III -Acute kidney injury after surgery.  This seem to be resolved.  -Appreciate hospitalist and nephrology evaluation and recommendations.   A. Fib -Intermittent rate control -Currently on therapeutic anticoagulation -Appreciate hospitalist and cardiologist evaluation and management   Encourage patient to get out of bed.  Will continue pain management.   Arnold Long, MD

## 2021-11-09 NOTE — Progress Notes (Signed)
PHARMACY - TOTAL PARENTERAL NUTRITION CONSULT NOTE   Indication: Prolonged ileus  Patient Measurements: Height: 5\' 6"  (167.6 cm) Weight: 66.4 kg (146 lb 6.2 oz) IBW/kg (Calculated) : 59.3 TPN AdjBW (KG): 56.2 Body mass index is 23.63 kg/m.  Assessment: 66 y.o. female with a past medical history of anemia, anxiety, aortic atherosclerosis, aortic valve stenosis, chronic atrial fibrillation, stage IV CKD, breast cancer with history of chemotherapy, history of radiotherapy, COPD, depression, diastolic dysfunction, GERD, hyperlipidemia, hypertension, lymphedema, small bowel obstruction, vitamin D deficiency who presented to the hospital for an colostomy reversal. Pharmacy has been consulted for TPN.   Glucose / Insulin: BG <180, 24-hr requirement 5 unit SSI Electrolytes: Mild hyponatremia, hyperphosphatemia Renal: Scr 1.92>2.21 Hepatic: LFTs WNL Intake / Output Last 2 Shifts:  0125 0701- 0126 0700  In: 1830.9 [P.O.:880; I.V.:606.7; IVPB: 344.3]  Out: 475 [Urine: 475] GI Imaging: 1/18 abdomen: persistent small bowel ileus GI Surgeries / Procedures:  s/p ileostomy reversal  Central access: 1/18 TPN start date: 1/18  RD Assessment: Estimated Needs Total Energy Estimated Needs: 1700-1900kcal/day Total Protein Estimated Needs: 85-95g/day Total Fluid Estimated Needs: 1.2-1.5L/day  Current Nutrition: Will advance to soft diet today  Plan:  Taper TPN to 25 mL/hr and stop @ 1800 Electrolytes in TPN: Na 50 mEq/L, K 25 mEq/L, Ca 5 mEq/L, Mg 5 mEq/L, and Phos 8 mmol/L. Cl:Ac 1.13:1 Add standard MVI and trace elements to TPN continue SSI Sensitive to q6h SSI and stop at 1800  Owens Loffler, PharmD Candidate 11/09/2021 7:17 AM

## 2021-11-09 NOTE — Progress Notes (Signed)
PROGRESS NOTE    Danielle [name]  NWG:[id number] DOB: March 31, 1956 DOA: 10/23/2021 PCP: [facility]    Assessment & Plan:   Principal Problem:   Ileostomy status ([place]) Active Problems:   Hyponatremia   Acute respiratory failure with hypoxia ([place])   Weakness   Acute kidney injury superimposed on CKD (HCC)   HTN (hypertension)   AF (paroxysmal atrial fibrillation) (HCC)   Normocytic anemia   Depression   Acute on chronic diastolic CHF (congestive heart failure) (HCC)   Hyperphosphatemia   Hypomagnesemia   Anemia of chronic disease   Ileus following gastrointestinal surgery (HCC)   Drop in hemoglobin   Fever   Aspiration pneumonia of right lower lobe due to gastric secretions (HCC)   Aspiration pneumonia: continue on IV unasyn, bronchodilators & encourage incentive spirometry   ACD: H&H are labile. S/p 2 units of pRBCs transfused so far    PAF: continue on metoprolol, cardizem, & eliquis   Postoperative ileus: s/p ileostomy reversal. NG tube has been d/c. Continue on full liquid diet. Continue TPN. Management as per general surg  Acute hypoxic respiratory failure: pulse ox did drop down into the 80s on 3 L.  Continue on supplemental oxygen and wean as tolerated. Likely secondary to aspiration pneumonia. Encourage incentive spirometry   AKI on CKDIIIb: Cr is labile. Consider holding lasix. Avoid nephrotoxic meds   Weakness: PT recs HH    DVT prophylaxis: eliquis  Code Status: full  Family Communication:  Disposition Plan: as per general surg recs   Level of care: Telemetry Medical  Status is: Inpatient  Remains inpatient appropriate because: severity of illness  Consultants:  Hospitalist  General surg (primary)  Procedures:   s/p ileostomy reversal.  Antimicrobials: unasyn    Subjective: Pt c/o shortness of breath, slightly improved from day prior   Objective: Vitals:   11/08/21 1919 11/09/21 0429 11/09/21 0500 11/09/21 0743  BP:  95/65 92/62  105/76  Pulse: 75 (!) 58  (!) 54  Resp: 20 16  19   Temp: 97.9 F (36.6 C) 97.6 F (36.4 C)  (!) 97.4 F (36.3 C)  TempSrc: Oral Oral  Oral  SpO2: 96% 94%  96%  Weight:   66.4 kg   Height:        Intake/Output Summary (Last 24 hours) at 11/09/2021 1335 Last data filed at 11/09/2021 0600 Gross per 24 hour  Intake 1310.93 ml  Output 400 ml  Net 910.93 ml   Filed Weights   11/07/21 0409 11/08/21 1033 11/09/21 0500  Weight: 63.6 kg 66.5 kg 66.4 kg    Examination:  General exam: Appears comfortable   Respiratory system: decreased breath sounds b/l  Cardiovascular system: S1/S2+. No rubs or clicks   Gastrointestinal system: Abd is soft, NT, ND & hypoactive bowel sounds  Central nervous system: alert and oriented. Moves all extremities  Psychiatry: Judgement and insight appears normal. Flat mood and affect     Data Reviewed: I have personally reviewed following labs and imaging studies  CBC: Recent Labs  Lab 11/03/21 1512 11/05/21 0518 11/06/21 0443 11/07/21 0409 11/07/21 1633 11/08/21 0339 11/09/21 0911  WBC  --  13.5* 12.9*  --   --   --  8.4  HGB 8.1* 7.5* 7.4* 6.6* 7.9* 7.7* 7.7*  HCT 24.7* 22.5* 23.0*  --  25.3*  --  24.5*  MCV  --  95.3 96.2  --   --   --  98.4  PLT  --  314 305  --   --   --  161   Basic Metabolic Panel: Recent Labs  Lab 11/03/21 0556 11/04/21 0435 11/05/21 0518 11/06/21 0443 11/07/21 0409 11/08/21 0337 11/09/21 0430  NA 139 139 137 138 137 136 134*  K 4.1 4.1 4.6 4.1 4.1 4.0 4.4  CL 102 97* 97* 102 104 104 103  CO2 34* 34* 29 27 25 23 24   GLUCOSE 113* 110* 128* 120* 101* 109* 108*  BUN 24* 35* 50* 57* 59* 60* 69*  CREATININE 1.46* 1.62* 1.73* 2.03* 1.96* 1.92* 2.21*  CALCIUM 8.6* 8.7* 8.5* 8.9 8.8* 8.7* 8.4*  MG 1.8 1.8  --  2.2  --   --  2.1  PHOS 4.0 5.7* 3.7 3.4  --  4.4 5.0*   GFR: Estimated Creatinine Clearance: 23.1 mL/min (A) (by C-G formula based on SCr of 2.21 mg/dL (H)). Liver Function Tests: Recent  Labs  Lab 11/03/21 0556 11/04/21 0435 11/06/21 0443 11/08/21 0337 11/09/21 0430  AST 15 14* 16  --  21  ALT 7 8 9   --  18  ALKPHOS 45 44 50  --  60  BILITOT 1.3* 1.1 2.2*  --  0.7  PROT 5.9* 5.9* 6.7  --  6.8  ALBUMIN 2.5* 2.6* 2.7* 2.6* 2.7*   Recent Labs  Lab 11/05/21 0518  LIPASE 22   No results for input(s): AMMONIA in the last 168 hours. Coagulation Profile: No results for input(s): INR, PROTIME in the last 168 hours. Cardiac Enzymes: No results for input(s): CKTOTAL, CKMB, CKMBINDEX, TROPONINI in the last 168 hours. BNP (last 3 results) No results for input(s): PROBNP in the last 8760 hours. HbA1C: No results for input(s): HGBA1C in the last 72 hours. CBG: Recent Labs  Lab 11/08/21 1140 11/08/21 1800 11/08/21 2348 11/09/21 0609 11/09/21 1117  GLUCAP 127* 153* 121* 135* 127*   Lipid Profile: No results for input(s): CHOL, HDL, LDLCALC, TRIG, CHOLHDL, LDLDIRECT in the last 72 hours.  Thyroid Function Tests: No results for input(s): TSH, T4TOTAL, FREET4, T3FREE, THYROIDAB in the last 72 hours. Anemia Panel: No results for input(s): VITAMINB12, FOLATE, FERRITIN, TIBC, IRON, RETICCTPCT in the last 72 hours. Sepsis Labs: Recent Labs  Lab 11/05/21 0518 11/05/21 0809 11/06/21 0443  PROCALCITON <0.10  --  0.51  LATICACIDVEN 0.9 0.8  --     Recent Results (from the past 240 hour(s))  MRSA Next Gen by PCR, Nasal     Status: None   Collection Time: 11/05/21  5:56 AM   Specimen: Nasal Mucosa; Nasal Swab  Result Value Ref Range Status   MRSA by PCR Next Gen NOT DETECTED NOT DETECTED Final    Comment: (NOTE) The GeneXpert MRSA Assay (FDA approved for NASAL specimens only), is one component of a comprehensive MRSA colonization surveillance program. It is not intended to diagnose MRSA infection nor to guide or monitor treatment for MRSA infections. Test performance is not FDA approved in patients less than 5 years old. Performed at [facility], [address] [place], [place] [address]   CULTURE, BLOOD (ROUTINE X 2) w Reflex to ID Panel     Status: None (Preliminary result)   Collection Time: 11/05/21  8:09 AM   Specimen: BLOOD  Result Value Ref Range Status   Specimen Description BLOOD BLOOD RIGHT WRIST  Final   Special Requests   Final    BOTTLES DRAWN AEROBIC ONLY Blood Culture adequate volume   Culture   Final    NO GROWTH 4 DAYS Performed at [facility], [address] [place], [place] [address]    Report Status PENDING  Incomplete  CULTURE, BLOOD (ROUTINE X 2) w Reflex to ID Panel     Status: None (Preliminary result)   Collection Time: 11/05/21  8:09 AM   Specimen: BLOOD  Result Value Ref Range Status   Specimen Description BLOOD BLOOD RIGHT HAND  Final   Special Requests   Final    BOTTLES DRAWN AEROBIC AND ANAEROBIC Blood Culture adequate volume   Culture   Final    NO GROWTH 4 DAYS Performed at [facility], [address] [place], [place] [address]    Report Status PENDING  Incomplete  Urine Culture     Status: None   Collection Time: 11/07/21  3:51 PM   Specimen: Urine, Clean Catch  Result Value Ref Range Status   Specimen Description   Final    URINE, CLEAN CATCH Performed at [facility], [address], [place], [place] [address]    Special Requests   Final    NONE Performed at [facility], [address] [place], [place] [address]    Culture   Final    NO GROWTH Performed at [facility], [place] [address] [place], [place] [address]    Report Status 11/09/2021 FINAL  Final         Radiology Studies: DG Chest 2 View  Result Date: 11/09/2021 CLINICAL DATA:  Shortness of breath.  Mild chest pain.  Smoker. EXAM: CHEST - 2 VIEW COMPARISON:  11/05/2021 FINDINGS: Right PICC tip in the region of the inferior aspect of the superior vena cava without significant change. The nasogastric tube has been removed. Normal sized heart. Stable linear scarring in  the right upper lobe. No significant change in a small right pleural effusion and right basilar airspace opacity. Clear left lung. Mildly tortuous and calcified thoracic aorta. No acute bony abnormality. IMPRESSION: Stable small right pleural effusion and right basilar atelectasis and/or pneumonia. Electronically Signed   By: Claudie Revering M.D.   On: 11/09/2021 10:17        Scheduled Meds:  sodium chloride   Intravenous Once   apixaban  2.5 mg Oral BID   Chlorhexidine Gluconate Cloth  6 each Topical Daily   citalopram  40 mg Oral [id number]   diltiazem  240 mg Oral Daily   famotidine  20 mg Oral Daily   feeding supplement  237 mL Oral TID BM   fluticasone furoate-vilanterol  1 puff Inhalation Daily   And   umeclidinium bromide  1 puff Inhalation Daily   furosemide  40 mg Intravenous BID   gabapentin  300 mg Oral BID   metoCLOPramide (REGLAN) injection  5 mg Intravenous Q8H   pantoprazole (PROTONIX) IV  40 mg Intravenous QHS   sodium chloride flush  10-40 mL Intracatheter Q12H   Vitamin D (Ergocalciferol)  50,000 Units Oral Q7 days   Continuous Infusions:  ampicillin-sulbactam (UNASYN) IV 3 g (11/09/21 1219)   TPN ADULT (ION) 25 mL/hr at 11/09/21 0904     LOS: 17 days    Time spent: 30 mins     Wyvonnia Dusky, MD [facility] Pager [phone]  If 7PM-7AM, please contact night-coverage 11/09/2021, 1:35 PM

## 2021-11-09 NOTE — Progress Notes (Signed)
Physical Therapy Treatment Patient Details Name: Danielle [name] MRN: [id number] DOB: Jul 19, 1956 Today's Date: 11/09/2021   History of Present Illness 66 y/o female s/p iliostomy take down 1/09.    PT Comments    Pt is making gradual progress towards goals and continues to be limited by O2 and HR control. Pt eager to ambulate with safe technique of RW. O2 sats on 4L with exertion decreasing to 61% and HR increasing to 140bpm. Deferred further ambulation distance at this time. Required 5L and seated rest break to improve to 90s%. RN notified. Will continue to progress as able.   Recommendations for follow up therapy are one component of a multi-disciplinary discharge planning process, led by the attending physician.  Recommendations may be updated based on patient status, additional functional criteria and insurance authorization.  Follow Up Recommendations  Home health PT     Assistance Recommended at Discharge Frequent or constant Supervision/Assistance  Patient can return home with the following A lot of help with walking and/or transfers;A lot of help with bathing/dressing/bathroom   Equipment Recommendations  Rolling walker (2 wheels)    Recommendations for Other Services       Precautions / Restrictions Precautions Precautions: Fall Restrictions Weight Bearing Restrictions: No     Mobility  Bed Mobility Overal bed mobility: Needs Assistance Bed Mobility: Supine to Sit, Sit to Supine     Supine to sit: Modified independent (Device/Increase time) Sit to supine: Modified independent (Device/Increase time)   General bed mobility comments: safe technique, extended time    Transfers Overall transfer level: Needs assistance Equipment used: Rolling walker (2 wheels) Transfers: Sit to/from Stand, Bed to chair/wheelchair/BSC Sit to Stand: Min guard           General transfer comment: upright posture with AD.    Ambulation/Gait Ambulation/Gait assistance: Min  guard Gait Distance (Feet): 40 Feet Assistive device: Rolling walker (2 wheels) Gait Pattern/deviations: Step-through pattern       General Gait Details: ambulation performed in room with reciprocal gait pattern. All mobility performed on 4L of O2. Further distance deferred due to O2 sats decreasing to 61%-although poor pleth. Returned back to bed, increased to 5L and with PLB improved to 91% taking several minutes. RN notified and present   Marine scientist [place]in (Stroke Patients Only)       Balance Overall balance assessment: Needs assistance Sitting-balance support: No upper extremity supported Sitting balance-Leahy Scale: Good     Standing balance support: Bilateral upper extremity supported Standing balance-Leahy Scale: Fair                              Cognition Arousal/Alertness: Awake/alert Behavior During Therapy: WFL for tasks assessed/performed Overall Cognitive Status: Within Functional Limits for tasks assessed                                          Exercises Other Exercises Other Exercises: ambulated to [facility], min guard for hygiene. Safe technique    General Comments        Pertinent Vitals/Pain Pain Assessment Pain Assessment: Faces Faces Pain Scale: Hurts little more Pain Location: abdomen Pain Descriptors / Indicators: Aching Pain Intervention(s): Limited activity within patient's tolerance, Patient requesting pain meds-RN notified    Home  Living                          Prior Function            PT Goals (current goals can now be found in the care plan section) Acute Rehab PT Goals Patient Stated Goal: go home PT Goal Formulation: With patient Time For Goal Achievement: 11/20/21 Potential to Achieve Goals: Fair Progress towards PT goals: Progressing toward goals    Frequency    Min 2X/week      PT Plan Current plan remains appropriate     Co-evaluation              AM-PAC PT "6 Clicks" Mobility   Outcome Measure  Help needed turning from your back to your side while in a flat bed without using bedrails?: None Help needed moving from lying on your back to sitting on the side of a flat bed without using bedrails?: None Help needed moving to and from a bed to a chair (including a wheelchair)?: A Little Help needed standing up from a chair using your arms (e.g., wheelchair or bedside chair)?: A Little Help needed to walk in hospital room?: A Little Help needed climbing 3-5 steps with a railing? : A Lot 6 Click Score: 19    End of Session Equipment Utilized During Treatment: Oxygen Activity Tolerance: Treatment limited secondary to medical complications (Comment) Patient left: in bed;with bed alarm set Nurse Communication: Mobility status PT Visit Diagnosis: Muscle weakness (generalized) (M62.81);Difficulty in walking, not elsewhere classified (R26.2)     Time: 3383-2919 PT Time Calculation (min) (ACUTE ONLY): 25 min  Charges:  $Gait Training: 8-22 mins $Therapeutic Activity: 8-22 mins                     Greggory Stallion, PT, DPT, GCS [phone]    Kenston Longton 11/09/2021, 4:14 PM

## 2021-11-09 NOTE — Progress Notes (Signed)
CARDIOLOGY CONSULT NOTE               Patient ID: Danielle [name] MRN: [id number] DOB/AGE: 68-Aug-1957 68 y.o.  Admit date: 10/23/2021 Referring Provider Neomia Glass, NP Primary Physician  Primary Cardiologist Dr. [name] Reason for Consultation Chest pain & pauses on telemetry early morning of 1/25  HPI: The patient is a 66 year old female with a past medical history significant for chronic atrial fibrillation, aortic stenosis, hypertension, hyperlipidemia, CKD stage III, anemia who was admitted to [facility] 10/23/2021 for an ileostomy takedown with Dr. Windell Moment. She has had a prolonged hospital course with post-operative ileus requiring TPN and anemia requiring 2 units of PRBCs. Cardiology was consulted on 11/08/2021 for chest pain and multiple pauses on telemetry overnight.  Interval history: -s/p IV lasix $Remove'80mg'AGasLTL$  per nephrology ordered for respiratory distress yesterday after ambulation -no PRN metoprolol required yesterday. Was in Afib overnight and had a single 2 second pause on telemetry, patient asymptomatic -she reports another episode of burning in her chest this morning and some pain when she coughs, famotidine added per surgery. Denies palpitations, dizziness.  Review of systems complete and found to be negative unless listed above   Past Medical History:  Diagnosis Date   Anemia    Anxiety    Aortic atherosclerosis ([place])    Aortic valve stenosis 02/10/2018   a.) TTE 02/10/2018: EF 55-60%: mild AS with MPG of 12 mmHg. b.) TTE 04/21/2018: EF 35-40%; mild AS with MPG 8 mmHg. c.) TTE 11/07/2019: EF 50-55%; mild AS with MPG 10 mmHg.   Arthritis    Atrial fibrillation (HCC)    a.) CHA2DS2-VASc = 4 (age, sex, HTN, aortic plaque). b.) Rate/rhythm maintained on oral carvedilol; chronically anticoagulated on dose reduced apixaban. c.)  Attempted deployment of LAA occlusive device on 01/11/2021; parameters failed and procedure aborted.   Breast cancer, left ([place]) 2000   a.)  T2N1M0; ER/PR (+) --> Tx'd with total mastectomy, LN resection, XRT, and chemotherapy   Cancer of right lung ([place]) 07/30/2016   a.) adenocarcinoma; ALK, ROS1, PDL1, BRAF, EGFR all negative.   CKD (chronic kidney disease), stage IV (HCC)    COPD (chronic obstructive pulmonary disease) (HCC)    Dependence on supplemental oxygen    Depression    Diastolic dysfunction 02/40/9735   a.) TTE 02/10/2017: EF 55-60%; G2DD. b.) TTE 04/21/2018: EF 35-40%; mild LA dilation; mod MV regurgitation. c.) TTE 11/07/2019: EF 50-55%; G1DD.   DOE (dyspnea on exertion)    GERD (gastroesophageal reflux disease)    Heart murmur    History of 2019 novel coronavirus disease (COVID-19) 10/14/2019   HLD (hyperlipidemia)    Hypertension    Long term current use of anticoagulant    a.) apixaban   Lymphedema    Personal history of chemotherapy    Personal history of radiation therapy    SBO (small bowel obstruction) ([place]) 11/05/2020   Vitamin D deficiency     Past Surgical History:  Procedure Laterality Date   Breast Biospy Left    [facility]   BREAST SURGERY     COLONOSCOPY N/A 04/30/2018   Procedure: COLONOSCOPY;  Surgeon: Virgel Manifold, MD;  Location: [facility] ENDOSCOPY;  Service: Endoscopy;  Laterality: N/A;   COLONOSCOPY N/A 07/22/2018   Procedure: COLONOSCOPY;  Surgeon: Virgel Manifold, MD;  Location: [facility] ENDOSCOPY;  Service: Endoscopy;  Laterality: N/A;   COLONOSCOPY WITH PROPOFOL N/A 09/21/2021   Procedure: COLONOSCOPY WITH PROPOFOL;  Surgeon: Benjamine Sprague, DO;  Location: [facility] ENDOSCOPY;  Service: General;  Laterality: N/A;   DILATION AND CURETTAGE OF UTERUS     ELECTROMAGNETIC NAVIGATION BROCHOSCOPY Right 04/11/2016   Procedure: ELECTROMAGNETIC NAVIGATION BRONCHOSCOPY;  Surgeon: Vilinda Boehringer, MD;  Location: [facility] ORS;  Service: Cardiopulmonary;  Laterality: Right;   ESOPHAGOGASTRODUODENOSCOPY N/A 07/22/2018   Procedure: ESOPHAGOGASTRODUODENOSCOPY (EGD);  Surgeon: Virgel Manifold, MD;   Location: [facility] ENDOSCOPY;  Service: Endoscopy;  Laterality: N/A;   ESOPHAGOGASTRODUODENOSCOPY (EGD) WITH PROPOFOL N/A 05/07/2018   Procedure: ESOPHAGOGASTRODUODENOSCOPY (EGD) WITH PROPOFOL;  Surgeon: Lucilla Lame, MD;  Location: [facility] ENDOSCOPY;  Service: Endoscopy;  Laterality: N/A;   ESOPHAGOGASTRODUODENOSCOPY (EGD) WITH PROPOFOL N/A 04/24/2019   Procedure: ESOPHAGOGASTRODUODENOSCOPY (EGD) WITH PROPOFOL;  Surgeon: Jonathon Bellows, MD;  Location: [facility] ENDOSCOPY;  Service: Gastroenterology;  Laterality: N/A;   ESOPHAGOGASTRODUODENOSCOPY (EGD) WITH PROPOFOL N/A 01/12/2020   Procedure: ESOPHAGOGASTRODUODENOSCOPY (EGD) WITH PROPOFOL;  Surgeon: Jonathon Bellows, MD;  Location: [facility] ENDOSCOPY;  Service: Gastroenterology;  Laterality: N/A;   ESOPHAGOGASTRODUODENOSCOPY (EGD) WITH PROPOFOL N/A 04/28/2020   Procedure: ESOPHAGOGASTRODUODENOSCOPY (EGD) WITH PROPOFOL;  Surgeon: Jonathon Bellows, MD;  Location: [facility] ENDOSCOPY;  Service: Gastroenterology;  Laterality: N/A;   EUS N/A 05/07/2019   Procedure: FULL UPPER ENDOSCOPIC ULTRASOUND (EUS) RADIAL;  Surgeon: Jola Schmidt, MD;  Location: [facility] ENDOSCOPY;  Service: Endoscopy;  Laterality: N/A;   ILEOSCOPY N/A 07/22/2018   Procedure: ILEOSCOPY THROUGH STOMA;  Surgeon: Virgel Manifold, MD;  Location: [facility] ENDOSCOPY;  Service: Endoscopy;  Laterality: N/A;   ILEOSTOMY     ILEOSTOMY N/A 09/08/2018   Procedure: ILEOSTOMY REVISION POSSIBLE CREATION;  Surgeon: Herbert Pun, MD;  Location: [facility] ORS;  Service: General;  Laterality: N/A;   ILEOSTOMY CLOSURE N/A 08/15/2018   Procedure: DILATION OF ILEOSTOMY STRICTURE;  Surgeon: Herbert Pun, MD;  Location: [facility] ORS;  Service: General;  Laterality: N/A;   LAPAROTOMY Right 05/04/2018   Procedure: EXPLORATORY LAPAROTOMY right colectomy right and left ostomy;  Surgeon: Herbert Pun, MD;  Location: [facility] ORS;  Service: General;  Laterality: Right;   LEFT ATRIAL APPENDAGE OCCLUSION N/A 01/11/2021   Procedure:  LEFT ATRIAL APPENDAGE OCCLUSION ([place]); ABORTED PROCEDURE WITHOUT DEVICE BEING IMPLANTED; Location: [facility]; Surgeon: Mylinda Latina, MD   LUNG BIOPSY     MASTECTOMY Left    2000, [place] Right    [place] TAKEDOWN N/A 10/23/2021   Procedure: XI ROBOTIC ASSISTED ILEOSTOMY TAKEDOWN;  Surgeon: Herbert Pun, MD;  Location: [facility] ORS;  Service: General;  Laterality: N/A;  180 minutes for the surgery part please    Medications Prior to Admission  Medication Sig Dispense Refill Last Dose   acetaminophen (TYLENOL) 325 MG tablet Take 2 tablets (650 mg total) by mouth every 6 (six) hours as needed for mild pain (or Fever >/= 101). (Patient taking differently: Take 650 mg by mouth every 4 (four) hours as needed for moderate pain.)      albuterol (PROVENTIL HFA;VENTOLIN HFA) 108 (90 Base) MCG/ACT inhaler Inhale 2 puffs into the lungs every 6 (six) hours as needed for wheezing or shortness of breath. 1 Inhaler 2 10/23/2021   amLODipine (NORVASC) 5 MG tablet Take 5 mg by mouth every morning.   10/23/2021   apixaban (ELIQUIS) 2.5 MG TABS tablet Take 2.5 mg by mouth 2 (two) times daily.   10/15/2021   calcium carbonate (TUMS - DOSED IN MG ELEMENTAL CALCIUM) 500 MG chewable tablet Chew 1 tablet by mouth daily.   Past Week   CALCIUM-VITAMIN D PO Take 1 tablet by mouth daily.  Past Week   carvedilol (COREG) 6.25 MG tablet Take 6.25 mg by mouth every morning.   10/23/2021   citalopram (CELEXA) 40 MG tablet Take 40 mg by mouth every morning.   10/22/2021   ferrous sulfate 325 (65 FE) MG tablet Take 1 tablet (325 mg total) by mouth 2 (two) times daily with a meal. 60 tablet 3 Past Week   Fluticasone-Umeclidin-Vilant 100-62.5-25 MCG/INH AEPB Inhale 1 puff into the lungs every morning.   10/22/2021   loperamide (IMODIUM) 2 MG capsule Take 2 mg by mouth as needed for diarrhea or loose stools.   Past Week   Multiple Vitamin (MULTIVITAMIN WITH MINERALS) TABS tablet Take  1 tablet by mouth daily. 30 tablet 1 Past Week   pantoprazole (PROTONIX) 40 MG tablet Take 1 tablet (40 mg total) by mouth 2 (two) times daily before a meal. 60 tablet 2 10/23/2021   sucralfate (CARAFATE) 1 GM/10ML suspension Take 10 mLs (1 g total) by mouth 4 (four) times daily. (Patient taking differently: Take 1 g by mouth 4 (four) times daily as needed (acid).) 1200 mL 2 10/22/2021   torsemide (DEMADEX) 20 MG tablet Take 20 mg by mouth in the morning and at bedtime.   10/22/2021   zolpidem (AMBIEN) 5 MG tablet Take 5 mg by mouth at bedtime as needed for sleep.   10/22/2021   OXYGEN Inhale 2 L into the lungs as needed (shortness of breath).      Social History   Socioeconomic History   Marital status: Divorced    Spouse name: Not on file   Number of children: 3   Years of education: Not on file   Highest education level: Not on file  Occupational History   Occupation: Retired   Tobacco Use   Smoking status: Former    Packs/day: 0.50    Years: 20.00    Pack years: 10.00    Types: Cigarettes    Quit date: 07/02/2012    Years since quitting: 9.3   Smokeless tobacco: Current    Types: Snuff   Tobacco comments:    quit 2014  Vaping Use   Vaping Use: Never used  Substance and Sexual Activity   Alcohol use: Yes    Comment: Occasionally beer   Drug use: No   Sexual activity: Not Currently  Other Topics Concern   Not on file  Social History Narrative   Not on file   Social Determinants of Health   Financial Resource Strain: Not on file  Food Insecurity: Not on file  Transportation Needs: Not on file  Physical Activity: Not on file  Stress: Not on file  Social Connections: Not on file  Intimate Partner Violence: Not on file    Family History  Problem Relation Age of Onset   Breast cancer Mother 85   Cancer Mother        Breast    Cirrhosis Father    Breast cancer Paternal Aunt 15   Cancer Maternal Aunt        Breast       Review of systems complete and found to be  negative unless listed above    PHYSICAL EXAM General: Pleasant elderly black female, in no acute distress. Sitting at incline in bed.  HEENT:  Normocephalic and atraumatic. Neck:  No JVD.  Lungs: Normal respiratory effort on 4L by [place]. Poor air movement, bibasilar crackles.  Heart: HRRR . Normal S1 and S2 without gallops. 3/6 systolic murmur best heart at RUSB. Radial &  DP pulses 2+ bilaterally. Abdomen: Non-distended appearing.  Msk: Normal strength and tone for age. Extremities: No clubbing, cyanosis. Trace bilateral lower extremity edema, L>R.  Neuro: Alert and oriented X 3. Psych:  Mood appropriate, affect congruent.   Labs:   Lab Results  Component Value Date   WBC 12.9 (H) 11/06/2021   HGB 7.7 (L) 11/08/2021   HCT 25.3 (L) 11/07/2021   MCV 96.2 11/06/2021   PLT 305 11/06/2021    Recent Labs  Lab 11/09/21 0430  NA 134*  K 4.4  CL 103  CO2 24  BUN 69*  CREATININE 2.21*  CALCIUM 8.4*  PROT 6.8  BILITOT 0.7  ALKPHOS 60  ALT 18  AST 21  GLUCOSE 108*    Lab Results  Component Value Date   CKTOTAL 207 06/26/2013   CKMB 1.9 06/27/2013   TROPONINI <0.03 12/24/2018    Lab Results  Component Value Date   CHOL 152 01/21/2020   Lab Results  Component Value Date   HDL 87 01/21/2020   Lab Results  Component Value Date   LDLCALC 53 01/21/2020   Lab Results  Component Value Date   TRIG 95 11/06/2021   TRIG 57 11/02/2021   TRIG 60 01/21/2020   Lab Results  Component Value Date   CHOLHDL 1.7 01/21/2020   No results found for: LDLDIRECT    Radiology: CT ABDOMEN PELVIS WO CONTRAST  Result Date: 11/05/2021 CLINICAL DATA:  Sepsis, fever, postoperative, rule out intra-abdominal abscess EXAM: CT ABDOMEN AND PELVIS WITHOUT CONTRAST TECHNIQUE: Multidetector CT imaging of the abdomen and pelvis was performed following the standard protocol without IV contrast. Oral enteric contrast was administered. RADIATION DOSE REDUCTION: This exam was performed according to  the departmental dose-optimization program which includes automated exposure control, adjustment of the mA and/or kV according to patient size and/or use of iterative reconstruction technique. COMPARISON:  10/27/2021 FINDINGS: Lower chest: Small right pleural effusion associated atelectasis or consolidation. Three-vessel coronary artery calcifications. Hepatobiliary: No solid liver abnormality is seen. No gallstones, gallbladder wall thickening, or biliary dilatation. Pancreas: Unremarkable. No pancreatic ductal dilatation or surrounding inflammatory changes. Spleen: Normal in size without significant abnormality. Adrenals/Urinary Tract: Adrenal glands are unremarkable. Kidneys are normal, without renal calculi, solid lesion, or hydronephrosis. Bladder is unremarkable. Stomach/Bowel: Stomach is within normal limits. The mid small bowel is diffusely gas and contrast filled, and mildly distended, largest loops measuring 4.1 cm. Status post right hemicolectomy. Descending and sigmoid diverticula. Vascular/Lymphatic: Aortic atherosclerosis. No enlarged abdominal or pelvic lymph nodes. Reproductive: No mass or other significant abnormality. Other: Anasarca. Persistent, although improved, scattered subcutaneous emphysema about the abdominal wall (series 2, image 38). Unchanged subcutaneous fluid collections of the left and right ventral abdomen, collection on the left measuring 3.4 x 2.2 cm (series 2, image 36), collection on the right measuring 3.5 x 2.0 cm (series 2, image 43). No ascites. Musculoskeletal: No acute or significant osseous findings. IMPRESSION: 1. Unchanged subcutaneous fluid collections of the left and right ventral abdomen, collection on the left measuring 3.4 x 2.2 cm, collection on the right measuring 3.5 x 2.0 cm. These most likely reflect postoperative hematoma or seroma, although presence or absence of infection within this fluid is not established by CT. 2. Status post right hemicolectomy. 3. The  mid small bowel is diffusely gas and contrast filled, and mildly distended, largest loops measuring 4.1 cm. Findings are most consistent with postoperative ileus. 4. Right pleural effusion and anasarca. 5. Coronary artery disease. Aortic Atherosclerosis (ICD10-I70.0). Electronically Signed  By: Delanna Ahmadi M.D.   On: 11/05/2021 17:34   CT ABDOMEN PELVIS WO CONTRAST  Result Date: 10/27/2021 CLINICAL DATA:  Postop day 4 from ileostomy reversal. Vomiting. Evaluate for anastomotic leak. EXAM: CT ABDOMEN AND PELVIS WITHOUT CONTRAST TECHNIQUE: Multidetector CT imaging of the abdomen and pelvis was performed following the standard protocol without IV contrast. RADIATION DOSE REDUCTION: This exam was performed according to the departmental dose-optimization program which includes automated exposure control, adjustment of the mA and/or kV according to patient size and/or use of iterative reconstruction technique. COMPARISON:  Abdominopelvic CT 11/05/2020.  Radiographs 10/26/2021. FINDINGS: Study was performed with the patient in the right lateral decubitus position. Rectal contrast was administered. Lower chest: Apparent right middle lobe collapse with a small amount of adjacent pleural fluid, incompletely visualized. Mild dependent atelectasis in both lower lobes. There is atherosclerosis of the aorta and coronary arteries. Hepatobiliary: Stable low-density liver lesions, likely cysts based on stability. Stable mild biliary dilatation status post cholecystectomy. Pancreas: Unremarkable. No pancreatic ductal dilatation or surrounding inflammatory changes. Spleen: Normal in size without focal abnormality. Adrenals/Urinary Tract: Both adrenal glands appear normal. Both kidneys demonstrate mild perinephric soft tissue stranding, similar to previous study. No evidence of urinary tract calculus or hydronephrosis. The bladder appears unremarkable for its degree of distention. Stomach/Bowel: No oral contrast was  administered. Rectal contrast has passed retrograde through the colon and through the ileocolonic anastomosis at the level of the mid transverse colon status post right hemicolectomy. There is retrograde filling of the distal small bowel. No anastomotic leak. The stomach and small bowel are fluid-filled and mildly distended. No evidence of bowel wall thickening or pneumatosis. Vascular/Lymphatic: There are no enlarged abdominal or pelvic lymph nodes. Diffuse aortic and branch vessel atherosclerosis. Reproductive: Stable appearance of the uterus and adnexa. No adnexal mass. Tubal ligation clips noted. Other: As seen on the postoperative radiographs, there is a large amount of soft tissue emphysema throughout the anterior abdominal wall which extends from the perineum into the presternal soft tissues. There are small focal fluid collections within the right and left anterior abdominal wall, likely related to recent laparoscopic procedure. No free intraperitoneal air, ascites or focal extraluminal fluid collections identified within the peritoneal cavity. Musculoskeletal: No acute or significant osseous findings. Stable chronic mild superior endplate compression fractures at L3 and L5. IMPRESSION: 1. Status post recent ileostomy reversal and ileocolonic anastomosis. The anastomosis is patent, without leak. 2. Nonspecific gastric and proximal small bowel distension without focal transition point, likely related to a postoperative ileus. Recommend radiographic follow-up. Patient may benefit from a nasogastric tube. 3. As seen on postoperative radiographs, there is extensive soft tissue emphysema throughout the abdominal wall which is likely related to the laparoscopic procedure. Soft tissue infection should be excluded clinically. Small fluid collections in the anterior abdominal wall are attributed to the procedure, and no unexpected collections are identified. There is no pneumoperitoneum or abnormal intra-abdominal  fluid collection. 4. Atelectasis at both lung bases with apparent partial right middle lobe collapse. 5. Extensive Aortic Atherosclerosis (ICD10-I70.0). Electronically Signed   By: Richardean Sale M.D.   On: 10/27/2021 16:28   DG Chest 1 View  Result Date: 10/26/2021 CLINICAL DATA:  Hypotension EXAM: CHEST  1 VIEW COMPARISON:  11/16/2020, 06/05/2020, PET CT 12/01/2019, CT chest 11/06/2019, 11/06/2019 FINDINGS: Mild cardiomegaly with central vascular congestion. Small pleural effusions and diffuse increased interstitial opacity likely due to edema. Aortic atherosclerosis. No pneumothorax. Irregular opacity with mild distortion and nodularity in the right upper lobe  and right hilus similar compared to intermittent previous exams and presumably due to scarring. IMPRESSION: 1. Cardiomegaly with vascular congestion, small pleural effusions and mild diffuse interstitial edema 2. Irregular and nodular opacities in the right hilus and upper lobe present on previous exams and probably due to scarring Electronically Signed   By: Donavan Foil M.D.   On: 10/26/2021 17:54   DG Abd 1 View  Result Date: 11/03/2021 CLINICAL DATA:  NG tube placement EXAM: ABDOMEN - 1 VIEW COMPARISON:  11/03/2021 11:05 a.m. FINDINGS: Advancement of NG tube with tip and side port below the GE junction. Redemonstrated small bowel dilatation, not significantly changed from the prior exam. The stomach is not distended. The lower abdomen is not included for evaluation. IMPRESSION: Interval advancement of nasogastric tube, with tip and side port overlying the stomach. Overall unchanged small bowel dilatation in the imaged portion of the abdomen. Electronically Signed   By: Merilyn Baba M.D.   On: 11/03/2021 16:01   DG Abd 1 View  Result Date: 10/28/2021 CLINICAL DATA:  NG tube placement EXAM: ABDOMEN - 1 VIEW COMPARISON:  10/26/2021 FINDINGS: Limited radiograph of the lower chest and upper abdomen was obtained for the purposes of enteric tube  localization. Enteric tube is seen coursing below the diaphragm with distal tipterminating within the proximal stomach. Side port terminates within the distal esophagus. Similar degree of gaseous distension of large and small bowel within the visualized abdomen. IMPRESSION: Enteric tube with distal tip terminating within the proximal stomach, side port terminating within the distal esophagus. Recommend advancement 7-10 cm. Electronically Signed   By: Davina Poke D.O.   On: 10/28/2021 15:48   DG Abd 1 View  Result Date: 10/26/2021 CLINICAL DATA:  Hypotension history of ileostomy take down EXAM: ABDOMEN - 1 VIEW COMPARISON:  11/05/2020 FINDINGS: Clips in the pelvis. Moderate air distension of the stomach. Air-filled dilated small bowel up to 3.5 cm in the central abdomen. Extensive mottled appearance of the abdominal soft tissues presumably due to soft tissue emphysema related to recent laparoscopic procedure. IMPRESSION: 1. Air distension of stomach and small bowel either due to ileus or possible obstruction. 2. Diffuse mottled appearance of soft tissues felt consistent with soft tissue emphysema likely due to recent laparoscopic procedure Electronically Signed   By: Donavan Foil M.D.   On: 10/26/2021 17:51   US RENAL  Result Date: 10/25/2021 CLINICAL DATA:  Acute kidney injury EXAM: RENAL / URINARY TRACT ULTRASOUND COMPLETE COMPARISON:  CT 11/05/2020 FINDINGS: Right Kidney: Renal measurements: 8.9 x 4.1 x 4.2 cm = volume: 79.8 mL. Cortex slightly echogenic. No mass or hydronephrosis Left Kidney: Renal measurements: 7.5 x 4.4 x 4 cm = volume: 88.5 mL. Cortex slightly echogenic. No mass or hydronephrosis. Bladder: Appears normal for degree of bladder distention. Other: None. IMPRESSION: Slight increased cortical echogenicity suggesting medical renal disease. No hydronephrosis. Electronically Signed   By: Donavan Foil M.D.   On: 10/25/2021 16:28   DG Chest Port 1 View  Result Date:  11/05/2021 CLINICAL DATA:  Fever. EXAM: PORTABLE CHEST 1 VIEW COMPARISON:  Chest radiograph 10/26/2021. FINDINGS: Right upper extremity PICC line tip projects over the superior vena cava. Enteric tube is present. The side port is at the level of the distal esophagus. Stable enlarged cardiac and mediastinal contours. Similar-appearing distortion and consolidation within the mid right lung. Bibasilar atelectasis, right-greater-than-left. Probable small right pleural effusion. No pneumothorax. IMPRESSION: Enteric tube side-port at the distal esophagus, recommend advancement. Right upper extremity PICC line tip projects  over the superior vena cava. Similar-appearing irregular and nodular areas of consolidation within the right mid lung. These results will be called to the ordering clinician or representative by the Radiologist Assistant, and communication documented in the PACS or [employer]. Electronically Signed   By: Annia Belt M.D.   On: 11/05/2021 08:04   DG Abd 2 Views  Result Date: 11/06/2021 CLINICAL DATA:  Abdominal pain EXAM: ABDOMEN - 2 VIEW COMPARISON:  Abdominal x-ray 11/03/2021 FINDINGS: Enteric tube identified with the tip in the lower esophagus. Mildly distended loops of small bowel are again seen throughout the abdomen measuring up to 3.3 cm in diameter. No free air identified on the decubitus view. No suspicious calcifications identified. IMPRESSION: 1. Enteric tube tip is in the lower esophagus, advancement recommended. 2. Mildly distended loops of bowel throughout the abdomen which could be seen with ileus or obstruction. Continued follow-up recommended. Electronically Signed   By: Jannifer Hick M.D.   On: 11/06/2021 10:42   DG Abd 2 Views  Result Date: 11/03/2021 CLINICAL DATA:  Abdominal distention EXAM: ABDOMEN - 2 VIEW COMPARISON:  Previous studies including the examination of 11/02/2021 FINDINGS: There is interval worsening of small bowel dilation measuring up to 4.9 cm in  diameter. Tip enteric tube is noted at the gastroesophageal junction pointing cephalad. There is no distention of stomach. There is interval passage of contrast from the colon. There is evidence of previous tubal ligation. IMPRESSION: There is interval increase in small bowel dilation suggesting high-grade small bowel obstruction. Tip of enteric tube is at the gastroesophageal junction pointing cephalad. NG tube should be advanced 5-10 cm to place the tip and side port within the stomach. Electronically Signed   By: Ernie Avena M.D.   On: 11/03/2021 12:36   DG Abd 2 Views  Result Date: 10/31/2021 CLINICAL DATA:  Ileus following gastrointestinal surgery, colostomy takedown 10/23/2021 EXAM: ABDOMEN - 2 VIEW COMPARISON:  10/28/2021 FINDINGS: Nasogastric tube in proximal stomach. Persistent dilatation of small bowel loops in the abdomen without wall thickening. Small amount retained contrast in colon. Levoconvex lumbar scoliosis. Foci of soft tissue gas at RIGHT lateral abdominal wall consistent with preceding surgery. Atelectasis at RIGHT lung base. IMPRESSION: Persistent small bowel ileus. Electronically Signed   By: Ulyses Southward M.D.   On: 10/31/2021 10:59   DG Abd Portable 1V-Small Bowel Obstruction Protocol-initial, 8 hr delay  Result Date: 11/02/2021 CLINICAL DATA:  8 hour follow-up small-bowel delay EXAM: PORTABLE ABDOMEN - 1 VIEW COMPARISON:  Film from the previous day. FINDINGS: Gastric catheter is noted within the stomach. Contrast material administered now lies within the dilated small bowel as well as within the colon. These changes are consistent with a partial small bowel obstruction. No free air is noted. IMPRESSION: Passage of contrast into the colon consistent with a partial small bowel obstruction. Electronically Signed   By: Alcide Clever M.D.   On: 11/02/2021 03:13   DG Abd Portable 1V  Result Date: 11/01/2021 CLINICAL DATA:  Enteric catheter placement EXAM: PORTABLE ABDOMEN - 1  VIEW COMPARISON:  10/31/2021 FINDINGS: Frontal view of the lower chest and upper abdomen demonstrates enteric catheter tip and side port projecting over the gastric fundus. Right-sided PICC tip overlies atriocaval junction. Cardiac silhouette is enlarged. Small right pleural effusion. Stable vascular congestion. Stable distended gas-filled loops of small bowel, likely ileus given recent surgery. IMPRESSION: 1. Enteric catheter projecting over gastric fundus. 2. Stable small bowel dilatation consistent with ileus. Electronically Signed   By: Casimiro Needle  Owens Shark M.D.   On: 11/01/2021 16:02   ECHOCARDIOGRAM COMPLETE  Result Date: 10/27/2021    ECHOCARDIOGRAM REPORT   Patient Name:   Danielle [name] Date of Exam: 10/27/2021 Medical Rec #:  [id number]       Height:       66.0 in Accession #:    [id number]      Weight:       124.0 lb Date of Birth:  1956/03/24       BSA:          21.632 m Patient Age:    71 years        BP:           144/84 mmHg Patient Gender: F               HR:           95 bpm. Exam Location:  [facility] Procedure: 2D Echo, Cardiac Doppler and Color Doppler Indications:     Elevated troponin  History:         Patient has prior history of Echocardiogram examinations, most                  recent 11/07/2019. COPD, Arrythmias:Atrial Fibrillation,                  Signs/Symptoms:Murmur; Risk Factors:Hypertension.  Sonographer:     Sherrie Sport Referring Phys:  [place] Diagnosing Phys: Donnelly Angelica IMPRESSIONS  1. Left ventricular ejection fraction, by estimation, is 60 to 65%. The left ventricle has normal function. The left ventricle has no regional wall motion abnormalities. Left ventricular diastolic parameters are consistent with Grade I diastolic dysfunction (impaired relaxation).  2. Right ventricular systolic function is normal. The right ventricular size is mildly enlarged.  3. Right atrial size was mildly dilated.  4. The mitral valve is normal in structure. Moderate mitral valve regurgitation.  No evidence of mitral stenosis.  5. Tricuspid valve regurgitation is moderate.  6. The aortic valve is calcified. Aortic valve regurgitation is not visualized. Mild aortic valve stenosis.  7. The inferior vena cava is normal in size with greater than 50% respiratory variability, suggesting right atrial pressure of 3 mmHg. Conclusion(s)/Recommendation(s): CALCIFIED AORTIC VALVE WITH MILD AS. VMax 2.1 m/s. Dimensionless index 0.4. FINDINGS  Left Ventricle: Left ventricular ejection fraction, by estimation, is 60 to 65%. The left ventricle has normal function. The left ventricle has no regional wall motion abnormalities. The left ventricular internal cavity size was normal in size. There is  no left ventricular hypertrophy. Left ventricular diastolic parameters are consistent with Grade I diastolic dysfunction (impaired relaxation). Right Ventricle: The right ventricular size is mildly enlarged. No increase in right ventricular wall thickness. Right ventricular systolic function is normal. Left Atrium: Left atrial size was normal in size. Right Atrium: Right atrial size was mildly dilated. Pericardium: There is no evidence of pericardial effusion. Mitral Valve: The mitral valve is normal in structure. Moderate mitral valve regurgitation. No evidence of mitral valve stenosis. MV peak gradient, 3.1 mmHg. The mean mitral valve gradient is 1.0 mmHg. Tricuspid Valve: The tricuspid valve is normal in structure. Tricuspid valve regurgitation is moderate . No evidence of tricuspid stenosis. Aortic Valve: The aortic valve is calcified. Aortic valve regurgitation is not visualized. Mild aortic stenosis is present. Aortic valve mean gradient measures 9.4 mmHg. Aortic valve peak gradient measures 16.8 mmHg. Aortic valve area, by VTI measures 1.21 cm. Pulmonic Valve: The pulmonic valve was not well  visualized. Pulmonic valve regurgitation is not visualized. No evidence of pulmonic stenosis. Aorta: The aortic root is normal in size  and structure. Venous: The inferior vena cava is normal in size with greater than 50% respiratory variability, suggesting right atrial pressure of 3 mmHg. IAS/Shunts: No atrial level shunt detected by color flow Doppler.  LEFT VENTRICLE PLAX 2D LVIDd:         4.80 cm   Diastology LVIDs:         3.00 cm   LV e' medial:    5.87 cm/s LV PW:         1.10 cm   LV E/e' medial:  13.9 LV IVS:        0.75 cm   LV e' lateral:   12.40 cm/s LVOT diam:     2.00 cm   LV E/e' lateral: 6.6 LV SV:         45 LV SV Index:   28 LVOT Area:     3.14 cm  RIGHT VENTRICLE RV Basal diam:  3.80 cm RV S prime:     12.20 cm/s TAPSE (M-mode): 1.8 cm LEFT ATRIUM             Index        RIGHT ATRIUM           Index LA diam:        3.20 cm 1.96 cm/m   RA Area:     19.30 cm LA Vol (A2C):   58.8 ml 36.03 ml/m  RA Volume:   63.90 ml  39.15 ml/m LA Vol (A4C):   38.7 ml 23.71 ml/m LA Biplane Vol: 49.1 ml 30.08 ml/m  AORTIC VALVE                     PULMONIC VALVE AV Area (Vmax):    1.04 cm      PV Vmax:        0.59 m/s AV Area (Vmean):   0.91 cm      PV Vmean:       35.200 cm/s AV Area (VTI):     1.21 cm      PV VTI:         0.109 m AV Vmax:           205.20 cm/s   PV Peak grad:   1.4 mmHg AV Vmean:          140.200 cm/s  PV Mean grad:   1.0 mmHg AV VTI:            0.373 m       RVOT Peak grad: 4 mmHg AV Peak Grad:      16.8 mmHg AV Mean Grad:      9.4 mmHg LVOT Vmax:         67.90 cm/s LVOT Vmean:        40.500 cm/s LVOT VTI:          0.144 m LVOT/AV VTI ratio: 0.39  AORTA Ao Root diam: 3.17 cm MITRAL VALVE               TRICUSPID VALVE MV Area (PHT): 4.86 cm    TR Peak grad:   52.4 mmHg MV Area VTI:   3.01 cm    TR Vmax:        362.00 cm/s MV Peak grad:  3.1 mmHg MV Mean grad:  1.0 mmHg    SHUNTS MV Vmax:  0.88 m/s    Systemic VTI:  0.14 m MV Vmean:      55.0 cm/s   Systemic Diam: 2.00 cm MV Decel Time: 156 msec    Pulmonic VTI:  0.157 m MV E velocity: 81.40 cm/s MV A velocity: 51.40 cm/s MV E/A ratio:  1.58 Donnelly Angelica  Electronically signed by Donnelly Angelica Signature Date/Time: 10/27/2021/4:07:10 PM    Final    [place] EKG SITE RITE  Result Date: 11/01/2021 If Site Rite image not attached, placement could not be confirmed due to current cardiac rhythm.   ECHO 10/27/21 LVEF 76-54%, grade 1 diastolic dysfunction, mild AS  TELEMETRY reviewed by me: Early morning of 1/25 revealed 4 episodes of pauses lasting 4-6 seconds apiece while patient was in atrial fibrillation, during the day, mostly sinus rhythm with occasional periods of atrial fibrillation with normal rate, with the exception of 1/24 around noon where she was in RVR with rates >140.   EKG reviewed by me: atrial fibrillation and anterior T wave inversions and rate 83  ASSESSMENT AND PLAN:  The patient is a 66 year old female with a past medical history significant for chronic atrial fibrillation, mild aortic stenosis, HFpEF (10/27/21 LVEF 65-03%, grade 1 diastolic dysfunction), hypertension, hyperlipidemia, CKD stage III, anemia, and history of Right colectomy who was admitted to [facility] hospital 10/23/2021 for an ileostomy takedown with Dr. Windell Moment. She has had a prolonged hospital course with post-operative ileus requiring TPN, anemia requiring 2 units of PRBCs, and aspiration pneumonia. Cardiology was consulted on 11/08/2021 for chest pain and a 6-second pause on telemetry overnight.  #Atypical chest pain The patient reported two episodes of "feeling like she had to burp but couldn't" that became substernal chest pressure, "stinging and heartburn like" which resolved dilaudid on 1/25. Troponins trended 38-34 and EKG was without acute ischemic changes.  -Echo performed on 1/13 was without WMA and noted pt's mild AS.  -no further cardiac diagnostics at this time. Query if related to heartburn in the morning if pepcid helps.  -she will need follow up with her primary cardiologist, Dr. [name] 1-2 weeks after discharge.   #Bradycardia, 4 episodes of 4 to 6 seconds  pauses on telemetry #Paroxysmal atrial fibrillation Metoprolol succinate 12.5mg  was started on 1/24 at 2234 because her HR increased up to 140 with ambulation earlier that day.  -Recommend changing dosing of metoprolol to short acting PRN use during the day for sustained HR >130 if she is in AF with RVR  -continue diltiazem CD 240mg  once daily with hold parameters for BP and HR -monitor on telemetry for symptomatic bradycardic episodes and pauses, no need for permanent pacemaker at this time, but will need outpatient follow up for this consideration in the future.  -continue anticoagulation on Eliquis 2.5 mg twice daily with careful monitoring of her H/H. CHA2DS2-VASc 4.  #mild aortic stenosis #HFpEF (10/27/21 LVEF 54-65%, grade 1 diastolic dysfunction) #HTN #hyperlipidemia #CKD -agree with lasix per nephrology while inpatient and can likely resume home torsemide at discharge.  -home meds include diltiazem CD 240mg , torsemide 20mg  PO daily, and amlodipine 5mg  once daily.   Cardiology signing off for now, please contact myself or Dr. Clayborn Bigness for further questions if needed.   This patient's plan of care was discussed and created with Dr. Lujean Amel and he is in agreement.  Signed: Tristan Schroeder , PA-C 11/09/2021, 8:09 AM

## 2021-11-09 NOTE — Progress Notes (Signed)
[facility], [place] 11/09/21  Subjective:   LOS: 17 01/25 0701 - 01/26 0700 In: 1830.9 [P.O.:880; I.V.:606.7; IV Piggyback:344.3] Out: 475 [Urine:475]  Patient known to our practice from outpatient. Followed by Dr Holley Raring. Medical problems of chronic kidney disease, anemia of chronic kidney disease, secondary hyperparathyroidism, hypertension  Patient sitting up in bed Alert and oriented Tolerated small meals without nausea Remains on 4L [place], states her breathing is uncomfortable  Creatinine 2.2 Urine output of 461ml in 24 hours  Objective:  Vital signs in last 24 hours:  Temp:  [97.4 F (36.3 C)-98.4 F (36.9 C)] 97.4 F (36.3 C) (01/26 0743) Pulse Rate:  [54-97] 54 (01/26 0743) Resp:  [16-20] 19 (01/26 0743) BP: (92-120)/(62-87) 105/76 (01/26 0743) SpO2:  [94 %-98 %] 96 % (01/26 0743) Weight:  [66.4 kg] 66.4 kg (01/26 0500)  Weight change:  Filed Weights   11/07/21 0409 11/08/21 1033 11/09/21 0500  Weight: 63.6 kg 66.5 kg 66.4 kg    Intake/Output:    Intake/Output Summary (Last 24 hours) at 11/09/2021 1425 Last data filed at 11/09/2021 0600 Gross per 24 hour  Intake 1190.93 ml  Output 400 ml  Net 790.93 ml      Physical Exam: General: NAD elderly female, laying in the bed  HEENT Moist oral mucosa  Pulm/lungs basilar crackles,normal effort  CVS/Heart No rub or gallop  Abdomen:  Soft, bowel sounds present in right lower quadrant  Extremities: 1+ peripheral edema  Neurologic: Alert, able to answer questions appropriately  Skin: Warm, dry          Basic Metabolic Panel:  Recent Labs  Lab 11/03/21 0556 11/04/21 0435 11/05/21 0518 11/06/21 0443 11/07/21 0409 11/08/21 0337 11/09/21 0430  NA 139 139 137 138 137 136 134*  K 4.1 4.1 4.6 4.1 4.1 4.0 4.4  CL 102 97* 97* 102 104 104 103  CO2 34* 34* 29 27 25 23 24   GLUCOSE 113* 110* 128* 120* 101* 109* 108*  BUN 24* 35* 50* 57* 59* 60* 69*  CREATININE 1.46* 1.62* 1.73*  2.03* 1.96* 1.92* 2.21*  CALCIUM 8.6* 8.7* 8.5* 8.9 8.8* 8.7* 8.4*  MG 1.8 1.8  --  2.2  --   --  2.1  PHOS 4.0 5.7* 3.7 3.4  --  4.4 5.0*      CBC: Recent Labs  Lab 11/03/21 1512 11/05/21 0518 11/06/21 0443 11/07/21 0409 11/07/21 1633 11/08/21 0339 11/09/21 0911  WBC  --  13.5* 12.9*  --   --   --  8.4  HGB 8.1* 7.5* 7.4* 6.6* 7.9* 7.7* 7.7*  HCT 24.7* 22.5* 23.0*  --  25.3*  --  24.5*  MCV  --  95.3 96.2  --   --   --  98.4  PLT  --  314 305  --   --   --  276      No results found for: HEPBSAG, HEPBSAB, HEPBIGM    Microbiology:  Recent Results (from the past 240 hour(s))  MRSA Next Gen by PCR, Nasal     Status: None   Collection Time: 11/05/21  5:56 AM   Specimen: Nasal Mucosa; Nasal Swab  Result Value Ref Range Status   MRSA by PCR Next Gen NOT DETECTED NOT DETECTED Final    Comment: (NOTE) The GeneXpert MRSA Assay (FDA approved for NASAL specimens only), is one component of a comprehensive MRSA colonization surveillance program. It is not intended to diagnose MRSA infection nor to guide or monitor treatment  for MRSA infections. Test performance is not FDA approved in patients less than 41 years old. Performed at [facility], [address] [place], [place] [address]   CULTURE, BLOOD (ROUTINE X 2) w Reflex to ID Panel     Status: None (Preliminary result)   Collection Time: 11/05/21  8:09 AM   Specimen: BLOOD  Result Value Ref Range Status   Specimen Description BLOOD BLOOD RIGHT WRIST  Final   Special Requests   Final    BOTTLES DRAWN AEROBIC ONLY Blood Culture adequate volume   Culture   Final    NO GROWTH 4 DAYS Performed at [facility], [address], [place], [place] [address]    Report Status PENDING  Incomplete  CULTURE, BLOOD (ROUTINE X 2) w Reflex to ID Panel     Status: None (Preliminary result)   Collection Time: 11/05/21  8:09 AM   Specimen: BLOOD  Result Value Ref Range Status   Specimen Description BLOOD BLOOD  RIGHT HAND  Final   Special Requests   Final    BOTTLES DRAWN AEROBIC AND ANAEROBIC Blood Culture adequate volume   Culture   Final    NO GROWTH 4 DAYS Performed at [facility], [address], [place], [place] [address]    Report Status PENDING  Incomplete  Urine Culture     Status: None   Collection Time: 11/07/21  3:51 PM   Specimen: Urine, Clean Catch  Result Value Ref Range Status   Specimen Description   Final    URINE, CLEAN CATCH Performed at [facility], [address] [place], [place] [address]    Special Requests   Final    NONE Performed at [facility], [address] [place], [place] [address]    Culture   Final    NO GROWTH Performed at [facility], [place] [address], [place], [place] [address]    Report Status 11/09/2021 FINAL  Final    Coagulation Studies: No results for input(s): LABPROT, INR in the last 72 hours.  Urinalysis: No results for input(s): COLORURINE, LABSPEC, PHURINE, GLUCOSEU, HGBUR, BILIRUBINUR, KETONESUR, PROTEINUR, UROBILINOGEN, NITRITE, LEUKOCYTESUR in the last 72 hours.  Invalid input(s): APPERANCEUR     Imaging: DG Chest 2 View  Result Date: 11/09/2021 CLINICAL DATA:  Shortness of breath.  Mild chest pain.  Smoker. EXAM: CHEST - 2 VIEW COMPARISON:  11/05/2021 FINDINGS: Right PICC tip in the region of the inferior aspect of the superior vena cava without significant change. The nasogastric tube has been removed. Normal sized heart. Stable linear scarring in the right upper lobe. No significant change in a small right pleural effusion and right basilar airspace opacity. Clear left lung. Mildly tortuous and calcified thoracic aorta. No acute bony abnormality. IMPRESSION: Stable small right pleural effusion and right basilar atelectasis and/or pneumonia. Electronically Signed   By: Claudie Revering M.D.   On: 11/09/2021 10:17     Medications:    ampicillin-sulbactam (UNASYN) IV 3 g (11/09/21 1219)   TPN  ADULT (ION) 25 mL/hr at 11/09/21 0904    sodium chloride   Intravenous Once   apixaban  2.5 mg Oral BID   Chlorhexidine Gluconate Cloth  6 each Topical Daily   citalopram  40 mg Oral BH-[id number]   diltiazem  240 mg Oral Daily   famotidine  20 mg Oral Daily   feeding supplement  237 mL Oral TID BM   fluticasone furoate-vilanterol  1 puff Inhalation Daily  And   umeclidinium bromide  1 puff Inhalation Daily   [START ON 11/10/2021] furosemide  40 mg Intravenous Daily   gabapentin  300 mg Oral BID   metoCLOPramide (REGLAN) injection  5 mg Intravenous Q8H   pantoprazole (PROTONIX) IV  40 mg Intravenous QHS   sodium chloride flush  10-40 mL Intracatheter Q12H   Vitamin D (Ergocalciferol)  50,000 Units Oral Q7 days   albuterol, HYDROmorphone (DILAUDID) injection, lip balm, menthol-cetylpyridinium, metoprolol tartrate, ondansetron **OR** ondansetron (ZOFRAN) IV, oxyCODONE-acetaminophen, sodium chloride flush  Assessment/ Plan:  66 y.o. 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  Coronary artery disease, atherosclerosis, aortic stenosis, chronic atrial fibrillation   Principal Problem:   Ileostomy status ([place]) Active Problems:   Hyponatremia   Acute respiratory failure with hypoxia (HCC)   Weakness   Acute kidney injury superimposed on CKD (HCC)   HTN (hypertension)   AF (paroxysmal atrial fibrillation) (HCC)   Normocytic anemia   Depression   Acute on chronic diastolic CHF (congestive heart failure) (HCC)   Hyperphosphatemia   Hypomagnesemia   Anemia of chronic disease   Ileus following gastrointestinal surgery (HCC)   Drop in hemoglobin   Fever   Aspiration pneumonia of right lower lobe due to gastric secretions ([place])   #. 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 Baseline Creatnine 1.25  / GFR  on 10/30/2021 Recent Labs    11/06/21 0443 11/07/21 0409 11/08/21 0337 11/09/21 0430  CREATININE 2.03* 1.96* 1.92* 2.21*   AKI likely multifactorial Urinalysis from 122 shows large leukocytes, no WBCs, suggesting ? interstitial nephritis.  We will still await urine culture. Blood pressure has also been low normal with most recent blood pressure 100/48.  Hypotension can contribute to worsening renal function. Currently getting Unasyn for possible right lung Aspiration pneumonia cxr 11/05/21 -Creatinine elevated with initiation of Furosemide yesterday. Planned to order Furosemide 40mg  twice daily, but will reduce to once daily due to decreased renal function.  -No acute need for dialysis at this time.   #. Anemia of CKD  Lab Results  Component Value Date   HGB 7.7 (L) 11/09/2021   Patient has received blood transfusions this admission. We will continue to monitor during this admission.  #. SHPTH     Component Value Date/Time   PTH 152 (H) 10/30/2019 0435   Lab Results  Component Value Date   PHOS 5.0 (H) 11/09/2021  Will continue to monitor bone minerals Recommend ergocalciferol weekly  # S/p elective ileostomy reversal Hospital stay complicated by ileus TPN weaned due to adequate oral intake and tolerance We will continue to monitor fluid volume     LOS: [place] 1/26/20232:25 [place], [place]

## 2021-11-10 LAB — RENAL FUNCTION PANEL
Albumin: 2.8 g/dL — ABNORMAL LOW (ref 3.5–5.0)
Anion gap: 11 (ref 5–15)
BUN: 75 mg/dL — ABNORMAL HIGH (ref 8–23)
CO2: 22 mmol/L (ref 22–32)
Calcium: 8.6 mg/dL — ABNORMAL LOW (ref 8.9–10.3)
Chloride: 102 mmol/L (ref 98–111)
Creatinine, Ser: 2.71 mg/dL — ABNORMAL HIGH (ref 0.44–1.00)
GFR, Estimated: 19 mL/min — ABNORMAL LOW (ref >60)
Glucose, Bld: 134 mg/dL — ABNORMAL HIGH (ref 70–99)
Phosphorus: 5 mg/dL — ABNORMAL HIGH (ref 2.5–4.6)
Potassium: 3.8 mmol/L (ref 3.5–5.1)
Sodium: 135 mmol/L (ref 135–145)

## 2021-11-10 LAB — CBC
HCT: 24.8 % — ABNORMAL LOW (ref 36.0–46.0)
Hemoglobin: 7.8 g/dL — ABNORMAL LOW (ref 12.0–15.0)
MCH: 30.7 pg (ref 26.0–34.0)
MCHC: 31.5 g/dL (ref 30.0–36.0)
MCV: 97.6 fL (ref 80.0–100.0)
Platelets: 294 10*3/uL (ref 150–400)
RBC: 2.54 MIL/uL — ABNORMAL LOW (ref 3.87–5.11)
RDW: 15.7 % — ABNORMAL HIGH (ref 11.5–15.5)
WBC: 8.4 10*3/uL (ref 4.0–10.5)
nRBC: 0.2 % (ref 0.0–0.2)

## 2021-11-10 LAB — CULTURE, BLOOD (ROUTINE X 2)
Culture: NO GROWTH
Culture: NO GROWTH
Special Requests: ADEQUATE
Special Requests: ADEQUATE

## 2021-11-10 MED ORDER — FUROSEMIDE 10 MG/ML IJ SOLN
40.0000 mg | Freq: Two times a day (BID) | INTRAMUSCULAR | Status: DC
  Administered 2021-11-10 – 2021-11-18 (×17): 40 mg via INTRAVENOUS
  Filled 2021-11-10 (×17): qty 4

## 2021-11-10 NOTE — TOC Progression Note (Signed)
Transition of Care [facility]) - Progression Note    Patient Details  Name: Danielle [name] MRN: [id number] Date of Birth: [birth year]  [facility]) CM/SW Contact  Beverly Sessions, RN Phone Number: 11/10/2021, 2:51 PM  Clinical Narrative:     Andee Poles with adapt confirms that has has continuous home o2 at 2L  Patient currently requiring higher liter flow  Expected Discharge Plan: Home/Self Care Barriers to Discharge: Continued Medical Work up  Expected Discharge Plan and Services Expected Discharge Plan: Home/Self Care                                               Social Determinants of Health (SDOH) Interventions    Readmission Risk Interventions Readmission Risk Prevention Plan 10/16/2019 09/28/2019 06/20/2019  Transportation Screening Complete Complete Complete  Medication Review (RN Care Manager) Referral to Pharmacy Complete Complete  [place] or Home Care Consult Complete Patient refused Complete  SW Recovery Care/Counseling Consult Complete Complete -  Palliative Care Screening Not Applicable Not Applicable Not [place] Not Applicable Not Applicable -  Some recent data might be hidden

## 2021-11-10 NOTE — TOC Progression Note (Signed)
Transition of Care [facility]) - Progression Note    Patient Details  Name: Danielle [name] MRN: [id number] Date of Birth: 1955-11-07  Transition of Care [facility]) CM/SW Contact  Beverly Sessions, RN Phone Number: 11/10/2021, 2:08 PM  Clinical Narrative:     Patient remains on acute O2.  TOC to continue to follow when it is determined if patient will need O2 at discharge.  Patient continues to decline home health   Expected Discharge Plan: Home/Self Care Barriers to Discharge: Continued Medical Work up  Expected Discharge Plan and Services Expected Discharge Plan: Home/Self Care                                               Social Determinants of Health (SDOH) Interventions    Readmission Risk Interventions Readmission Risk Prevention Plan 10/16/2019 09/28/2019 06/20/2019  Transportation Screening Complete Complete Complete  Medication Review Press photographer) Referral to Pharmacy Complete Complete  [place] or Home Care Consult Complete Patient refused Complete  SW Recovery Care/Counseling Consult Complete Complete -  Palliative Care Screening Not Applicable Not Applicable Not [place] Not Applicable Not Applicable -  Some recent data might be hidden

## 2021-11-10 NOTE — Progress Notes (Signed)
PROGRESS NOTE    Danielle [name]  ZGY:[id number] DOB: Oct 04, 1956 DOA: 10/23/2021 PCP: [facility]    Assessment & Plan:   Principal Problem:   Ileostomy status ([place]) Active Problems:   Hyponatremia   Acute respiratory failure with hypoxia ([place])   Weakness   Acute kidney injury superimposed on CKD (HCC)   HTN (hypertension)   AF (paroxysmal atrial fibrillation) (HCC)   Normocytic anemia   Depression   Acute on chronic diastolic CHF (congestive heart failure) (HCC)   Hyperphosphatemia   Hypomagnesemia   Anemia of chronic disease   Ileus following gastrointestinal surgery (HCC)   Drop in hemoglobin   Fever   Aspiration pneumonia of right lower lobe due to gastric secretions (HCC)   Aspiration pneumonia: continue on IV unasyn, bronchodilators & encourage incentive spirometry   ACD: H&H are labile. S/p 2 units of pRBCs transfused this admission   PAF: continue on eliquis, cardizem & metoprolol   Postoperative ileus: s/p ileostomy reversal. NG tube has been d/c. TPN has been d/c. Diet advanced to soft diet as per general surg. Management as per general surg   Acute hypoxic respiratory failure: pulse ox did drop down into the 80s on 3 L.  Continue on supplemental oxygen and wean as tolerated. Encourage incentive spirometry   AKI on CKDIIIb: Cr is trending up. Consider holding lasix. Avoid nephrotoxic meds   Weakness: PT recs HH    DVT prophylaxis: eliquis  Code Status: full  Family Communication:  Disposition Plan: as per general surg recs   Level of care: Telemetry Medical  Status is: Inpatient  Remains inpatient appropriate because: severity of illness  Consultants:  Hospitalist  General surg (primary)  Procedures:   s/p ileostomy reversal.  Antimicrobials: unasyn    Subjective: Pt c/o shortness of breath with exertion, unchanged from day prior.   Objective: Vitals:   11/09/21 1551 11/09/21 1958 11/10/21 0505 11/10/21 0600  BP:  101/74 98/85 98/85    Pulse: 61 77 92   Resp: 18 18 18    Temp: (!) 97.5 F (36.4 C) 97.8 F (36.6 C) 97.7 F (36.5 C)   TempSrc: Oral Oral Oral   SpO2: 93% 93% 93%   Weight:    65.1 kg  Height:        Intake/Output Summary (Last 24 hours) at 11/10/2021 0748 Last data filed at 11/10/2021 0317 Gross per 24 hour  Intake 200 ml  Output --  Net 200 ml   Filed Weights   11/08/21 1033 11/09/21 0500 11/10/21 0600  Weight: 66.5 kg 66.4 kg 65.1 kg    Examination:  General exam: Appears calm & comfortable  Respiratory system: diminished breath sounds b/l  Cardiovascular system: S1 & S2+. No rubs or clicks  Gastrointestinal system: Abd is soft, NT, ND & normal bowel sounds  Central nervous system: alert and oriented. Moves all extremities  Psychiatry: judgement and insight appears normal. Flat mood and affect     Data Reviewed: I have personally reviewed following labs and imaging studies  CBC: Recent Labs  Lab 11/03/21 1512 11/05/21 0518 11/06/21 0443 11/07/21 0409 11/07/21 1633 11/08/21 0339 11/09/21 0911  WBC  --  13.5* 12.9*  --   --   --  8.4  HGB 8.1* 7.5* 7.4* 6.6* 7.9* 7.7* 7.7*  HCT 24.7* 22.5* 23.0*  --  25.3*  --  24.5*  MCV  --  95.3 96.2  --   --   --  98.4  PLT  --  314 305  --   --   --  993   Basic Metabolic Panel: Recent Labs  Lab 11/04/21 0435 11/05/21 0518 11/06/21 0443 11/07/21 0409 11/08/21 0337 11/09/21 0430  NA 139 137 138 137 136 134*  K 4.1 4.6 4.1 4.1 4.0 4.4  CL 97* 97* 102 104 104 103  CO2 34* 29 27 25 23 24   GLUCOSE 110* 128* 120* 101* 109* 108*  BUN 35* 50* 57* 59* 60* 69*  CREATININE 1.62* 1.73* 2.03* 1.96* 1.92* 2.21*  CALCIUM 8.7* 8.5* 8.9 8.8* 8.7* 8.4*  MG 1.8  --  2.2  --   --  2.1  PHOS 5.7* 3.7 3.4  --  4.4 5.0*   GFR: Estimated Creatinine Clearance: 23.1 mL/min (A) (by C-G formula based on SCr of 2.21 mg/dL (H)). Liver Function Tests: Recent Labs  Lab 11/04/21 0435 11/06/21 0443 11/08/21 0337 11/09/21 0430  AST  14* 16  --  21  ALT 8 9  --  18  ALKPHOS 44 50  --  60  BILITOT 1.1 2.2*  --  0.7  PROT 5.9* 6.7  --  6.8  ALBUMIN 2.6* 2.7* 2.6* 2.7*   Recent Labs  Lab 11/05/21 0518  LIPASE 22   No results for input(s): AMMONIA in the last 168 hours. Coagulation Profile: No results for input(s): INR, PROTIME in the last 168 hours. Cardiac Enzymes: No results for input(s): CKTOTAL, CKMB, CKMBINDEX, TROPONINI in the last 168 hours. BNP (last 3 results) No results for input(s): PROBNP in the last 8760 hours. HbA1C: No results for input(s): HGBA1C in the last 72 hours. CBG: Recent Labs  Lab 11/08/21 1140 11/08/21 1800 11/08/21 2348 11/09/21 0609 11/09/21 1117  GLUCAP 127* 153* 121* 135* 127*   Lipid Profile: No results for input(s): CHOL, HDL, LDLCALC, TRIG, CHOLHDL, LDLDIRECT in the last 72 hours.  Thyroid Function Tests: No results for input(s): TSH, T4TOTAL, FREET4, T3FREE, THYROIDAB in the last 72 hours. Anemia Panel: No results for input(s): VITAMINB12, FOLATE, FERRITIN, TIBC, IRON, RETICCTPCT in the last 72 hours. Sepsis Labs: Recent Labs  Lab 11/05/21 0518 11/05/21 0809 11/06/21 0443  PROCALCITON <0.10  --  0.51  LATICACIDVEN 0.9 0.8  --     Recent Results (from the past 240 hour(s))  MRSA Next Gen by PCR, Nasal     Status: None   Collection Time: 11/05/21  5:56 AM   Specimen: Nasal Mucosa; Nasal Swab  Result Value Ref Range Status   MRSA by PCR Next Gen NOT DETECTED NOT DETECTED Final    Comment: (NOTE) The GeneXpert MRSA Assay (FDA approved for NASAL specimens only), is one component of a comprehensive MRSA colonization surveillance program. It is not intended to diagnose MRSA infection nor to guide or monitor treatment for MRSA infections. Test performance is not FDA approved in patients less than 27 years old. Performed at [facility], [address] [place], [place] [address]   CULTURE, BLOOD (ROUTINE X 2) w Reflex to ID Panel     Status: None    Collection Time: 11/05/21  8:09 AM   Specimen: BLOOD  Result Value Ref Range Status   Specimen Description BLOOD BLOOD RIGHT WRIST  Final   Special Requests   Final    BOTTLES DRAWN AEROBIC ONLY Blood Culture adequate volume   Culture   Final    NO GROWTH 5 DAYS Performed at [facility], [address], [place], [place] [address]    Report Status 11/10/2021 FINAL  Final  CULTURE, BLOOD (ROUTINE X 2) w Reflex to ID Panel     Status: None   Collection Time: 11/05/21  8:09 AM   Specimen: BLOOD  Result Value Ref Range Status   Specimen Description BLOOD BLOOD RIGHT HAND  Final   Special Requests   Final    BOTTLES DRAWN AEROBIC AND ANAEROBIC Blood Culture adequate volume   Culture   Final    NO GROWTH 5 DAYS Performed at [facility], [address] [place], [place] [address]    Report Status 11/10/2021 FINAL  Final  Urine Culture     Status: None   Collection Time: 11/07/21  3:51 PM   Specimen: Urine, Clean Catch  Result Value Ref Range Status   Specimen Description   Final    URINE, CLEAN CATCH Performed at [facility], [address] [place], [place] [address]    Special Requests   Final    NONE Performed at [facility], [address], [place], [place] [address]    Culture   Final    NO GROWTH Performed at [facility], [place] [address] [place], [place] [address]    Report Status 11/09/2021 FINAL  Final         Radiology Studies: DG Chest 2 View  Result Date: 11/09/2021 CLINICAL DATA:  Shortness of breath.  Mild chest pain.  Smoker. EXAM: CHEST - 2 VIEW COMPARISON:  11/05/2021 FINDINGS: Right PICC tip in the region of the inferior aspect of the superior vena cava without significant change. The nasogastric tube has been removed. Normal sized heart. Stable linear scarring in the right upper lobe. No significant change in a small right pleural effusion and right basilar airspace opacity. Clear left lung. Mildly tortuous  and calcified thoracic aorta. No acute bony abnormality. IMPRESSION: Stable small right pleural effusion and right basilar atelectasis and/or pneumonia. Electronically Signed   By: Claudie Revering M.D.   On: 11/09/2021 10:17        Scheduled Meds:  sodium chloride   Intravenous Once   apixaban  2.5 mg Oral BID   Chlorhexidine Gluconate Cloth  6 each Topical Daily   citalopram  40 mg Oral BH-q7a   diltiazem  240 mg Oral Daily   famotidine  20 mg Oral Daily   feeding supplement  237 mL Oral TID BM   fluticasone furoate-vilanterol  1 puff Inhalation Daily   And   umeclidinium bromide  1 puff Inhalation Daily   furosemide  40 mg Intravenous Daily   gabapentin  300 mg Oral BID   metoCLOPramide (REGLAN) injection  5 mg Intravenous Q8H   pantoprazole (PROTONIX) IV  40 mg Intravenous QHS   sodium chloride flush  10-40 mL Intracatheter Q12H   Vitamin D (Ergocalciferol)  50,000 Units Oral Q7 days   Continuous Infusions:  ampicillin-sulbactam (UNASYN) IV Stopped (11/10/21 0116)     LOS: 18 days    Time spent: 20 mins     Wyvonnia Dusky, MD [facility] Pager [phone]  If 7PM-7AM, please contact night-coverage 11/10/2021, 7:48 AM

## 2021-11-10 NOTE — Progress Notes (Signed)
Patient ID: Danielle [name], female   DOB: Mar 04, 1956, 66 y.o.   MRN: [id number]     [place] Hospital Day(s): 18.   Interval History: Patient seen and examined, no acute events or new complaints overnight. Patient reports feeling tired.  Patient today has been more sleepy.  She is still arousable and oriented.  She endorses that she ate well.  Denies abdominal pain.  She does feel fatigue.   Vital signs in last 24 hours: [min-max] current  Temp:  [97.7 F (36.5 C)-98.8 F (37.1 C)] 98.8 F (37.1 C) (01/27 1535) Pulse Rate:  [77-92] 88 (01/27 1535) Resp:  [18-20] 18 (01/27 1535) BP: (91-114)/(69-85) 91/69 (01/27 1535) SpO2:  [93 %-97 %] 97 % (01/27 1535) Weight:  [65.1 kg] 65.1 kg (01/27 0600)     Height: 5\' 6"  (167.6 cm) Weight: 65.1 kg (no blankets on bed) BMI (Calculated): 23.17   Physical Exam:  Constitutional: Sleepy but arousable, cooperative and no distress  Respiratory: breathing non-labored at rest with nasal cannula Cardiovascular: Irregular rhythm Gastrointestinal: soft, non-tender, and non-distended  Labs:  CBC Latest Ref Rng & Units 11/10/2021 11/09/2021 11/08/2021  WBC 4.0 - 10.5 K/uL 8.4 8.4 -  Hemoglobin 12.0 - 15.0 g/dL 7.8(L) 7.7(L) 7.7(L)  Hematocrit 36.0 - 46.0 % 24.8(L) 24.5(L) -  Platelets 150 - 400 K/uL 294 276 -   CMP Latest Ref Rng & Units 11/10/2021 11/09/2021 11/08/2021  Glucose 70 - 99 mg/dL 134(H) 108(H) 109(H)  BUN 8 - 23 mg/dL 75(H) 69(H) 60(H)  Creatinine 0.44 - 1.00 mg/dL 2.71(H) 2.21(H) 1.92(H)  Sodium 135 - 145 mmol/L 135 134(L) 136  Potassium 3.5 - 5.1 mmol/L 3.8 4.4 4.0  Chloride 98 - 111 mmol/L 102 103 104  CO2 22 - 32 mmol/L 22 24 23   Calcium 8.9 - 10.3 mg/dL 8.6(L) 8.4(L) 8.7(L)  Total Protein 6.5 - 8.1 g/dL - 6.8 -  Total Bilirubin 0.3 - 1.2 mg/dL - 0.7 -  Alkaline Phos 38 - 126 U/L - 60 -  AST 15 - 41 U/L - 21 -  ALT 0 - 44 U/L - 18 -    Imaging studies: No new pertinent imaging studies   Assessment/Plan:  66 y.o.  female with history of an ileostomy and mucous fistula 18 Day Post-Op s/p ileostomy reversal, complicated by pertinent comorbidities including COPD, atrial fibrillation on anticoagulation, hypertension, chronic kidney disease stage III.   Status post ileostomy reversal -Patient with postoperative ileus resolving. -Tolerated liquid diet.  She continued having bowel movement and passing gas.  Will advance to soft diet. -We will start decreasing Reglan Reglan -We will discontinue Dulcolax suppositories -We will contact to start discontinuing TPN     Chronic kidney disease stage III -Acute kidney injury after surgery.   -Initially creatinine decreased to baseline but no increasing trend again. -Nephrology managing with diuretics.    A. Fib -Intermittent rate control -Currently on therapeutic anticoagulation -Appreciate hospitalist and cardiologist evaluation and management   Encourage patient to get out of bed.  Will continue pain management.   Arnold Long, MD

## 2021-11-10 NOTE — Plan of Care (Signed)

## 2021-11-10 NOTE — Progress Notes (Signed)
Physical Therapy Treatment Patient Details Name: Danielle [name] MRN: [id number] DOB: 1955/11/14 Today's Date: 11/10/2021   History of Present Illness 66 y/o female s/p iliostomy take down 1/09.    PT Comments    Pt received in bed, required max encouragement to participate due to requesting PT to return in afternoon. Pt resting on 4L/min via [place]. SPO2 at rest >/= 90% and HR WNL. Pt requesting female staff member to assist in donning of extra gown prior to further mobility. Due to no staff member available, session limited to bed level exercise. Pt tolerating LE therex fair to poor. Pt has good form/technique however does display SOB and desaturates to 84% on 4L/min. Pt requires mod to max cuing on correct PLB and supine rest ~2 min for SpO2 to return up to 90%. By the time a female staff member is present to assist in donning gown for OOB mobility, pt declining due to worry of breakfast getting cold. Thus further mobility declined. Breakfast tray placed in front of pt. Anticipate pt would have difficulty with ambulating at this time due to desaturation to 84% with bed level exercises. However pt declining further assessment of OOB mobility, pt verbalizing she is comfortable and planning to d/c home. All needs in place.    Recommendations for follow up therapy are one component of a multi-disciplinary discharge planning process, led by the attending physician.  Recommendations may be updated based on patient status, additional functional criteria and insurance authorization.  Follow Up Recommendations  Home health PT     Assistance Recommended at Discharge Frequent or constant Supervision/Assistance  Patient can return home with the following A lot of help with walking and/or transfers;A lot of help with bathing/dressing/bathroom   Equipment Recommendations  Rolling walker (2 wheels)    Recommendations for Other Services       Precautions / Restrictions Precautions Precautions:  Fall Restrictions Weight Bearing Restrictions: No     Mobility  Bed Mobility               General bed mobility comments: NT in bed whole session    Transfers                   General transfer comment: NT on this date    Ambulation/Gait               General Gait Details: NT on this date   Stairs             Wheelchair Mobility    Modified Rankin (Stroke Patients Only)       Balance                                            Cognition Arousal/Alertness: Awake/alert Behavior During Therapy: WFL for tasks assessed/performed Overall Cognitive Status: Within Functional Limits for tasks assessed                                          Exercises General Exercises - Lower Extremity Ankle Circles/Pumps: AROM, 20 reps, Supine, Strengthening Short Arc Quad: AROM, Supine, Both, 5 reps Heel Slides: AROM, Supine, Strengthening, Both, 10 reps Hip ABduction/ADduction: AROM, Supine, Strengthening, Both, 10 reps Straight Leg Raises: AROM, Both, 5 reps, Supine Other Exercises Other Exercises: Education on  PLB to improve SPO2 sats    General Comments General comments (skin integrity, edema, etc.): Pt supine in bed all of session. Pt delcining OOB mobility without female present to assist in extra gown donning. Once NT present to assist in gown donning, pt declining further mobility due to not wanting breakfast to get cold.      Pertinent Vitals/Pain Pain Assessment Pain Assessment: No/denies pain    Home Living                          Prior Function            PT Goals (current goals can now be found in the care plan section) Acute Rehab PT Goals Patient Stated Goal: go home PT Goal Formulation: With patient Time For Goal Achievement: 11/20/21 Potential to Achieve Goals: Fair Progress towards PT goals: Not progressing toward goals - comment (session limited due to pt refusal)     Frequency    Min 2X/week      PT Plan Current plan remains appropriate    Co-evaluation              AM-PAC PT "6 Clicks" Mobility   Outcome Measure                   End of Session Equipment Utilized During Treatment: Oxygen Activity Tolerance: Other (comment) Patient left: in bed;with bed alarm set Nurse Communication: Mobility status PT Visit Diagnosis: Muscle weakness (generalized) (M62.81);Difficulty in walking, not elsewhere classified (R26.2)     Time: 0370-4888 PT Time Calculation (min) (ACUTE ONLY): 11 min  Charges:  $Therapeutic Exercise: 8-22 mins                     Salem Caster. Fairly IV, PT, DPT Physical Therapist- [facility]  11/10/2021, 11:05 AM

## 2021-11-10 NOTE — Progress Notes (Signed)
[facility], [place] 11/10/21  Subjective:   LOS: 18 01/26 0701 - 01/27 0700 In: 200 [IV Piggyback:200] Out: -   Patient known to our practice from outpatient. Followed by Dr Holley Raring. Medical problems of chronic kidney disease, anemia of chronic kidney disease, secondary hyperparathyroidism, hypertension  Patient sitting up in bed Tolerating meals  Complains of mild shortness of breath OOB with therapy to ambulate in room, sit in chair yesterday  Objective:  Vital signs in last 24 hours:  Temp:  [97.5 F (36.4 C)-98.6 F (37 C)] 98.6 F (37 C) (01/27 0837) Pulse Rate:  [61-92] 92 (01/27 0837) Resp:  [18-20] 20 (01/27 0837) BP: (98-114)/(74-85) 114/81 (01/27 0837) SpO2:  [93 %] 93 % (01/27 0837) Weight:  [65.1 kg] 65.1 kg (01/27 0600)  Weight change: -1.409 kg Filed Weights   11/08/21 1033 11/09/21 0500 11/10/21 0600  Weight: 66.5 kg 66.4 kg 65.1 kg    Intake/Output:    Intake/Output Summary (Last 24 hours) at 11/10/2021 1242 Last data filed at 11/10/2021 1043 Gross per 24 hour  Intake 440 ml  Output --  Net 440 ml      Physical Exam: General: NAD elderly female, laying in the bed  HEENT Moist oral mucosa  Pulm/lungs Diminished in bases, fine crackles in mid, normal effort, [place] O2  CVS/Heart No rub or gallop  Abdomen:  Soft, bowel sounds present in right lower quadrant  Extremities: 1+ peripheral edema  Neurologic: Alert, able to answer questions appropriately  Skin: Warm, dry          Basic Metabolic Panel:  Recent Labs  Lab 11/04/21 0435 11/05/21 0518 11/06/21 0443 11/07/21 0409 11/08/21 0337 11/09/21 0430 11/10/21 1024  NA 139 137 138 137 136 134* 135  K 4.1 4.6 4.1 4.1 4.0 4.4 3.8  CL 97* 97* 102 104 104 103 102  CO2 34* 29 27 25 23 24 22   GLUCOSE 110* 128* 120* 101* 109* 108* 134*  BUN 35* 50* 57* 59* 60* 69* 75*  CREATININE 1.62* 1.73* 2.03* 1.96* 1.92* 2.21* 2.71*  CALCIUM 8.7* 8.5* 8.9 8.8* 8.7* 8.4* 8.6*   MG 1.8  --  2.2  --   --  2.1  --   PHOS 5.7* 3.7 3.4  --  4.4 5.0* 5.0*      CBC: Recent Labs  Lab 11/05/21 0518 11/06/21 0443 11/07/21 0409 11/07/21 1633 11/08/21 0339 11/09/21 0911 11/10/21 1024  WBC 13.5* 12.9*  --   --   --  8.4 8.4  HGB 7.5* 7.4* 6.6* 7.9* 7.7* 7.7* 7.8*  HCT 22.5* 23.0*  --  25.3*  --  24.5* 24.8*  MCV 95.3 96.2  --   --   --  98.4 97.6  PLT 314 305  --   --   --  276 294      No results found for: HEPBSAG, HEPBSAB, HEPBIGM    Microbiology:  Recent Results (from the past 240 hour(s))  MRSA Next Gen by PCR, Nasal     Status: None   Collection Time: 11/05/21  5:56 AM   Specimen: Nasal Mucosa; Nasal Swab  Result Value Ref Range Status   MRSA by PCR Next Gen NOT DETECTED NOT DETECTED Final    Comment: (NOTE) The GeneXpert MRSA Assay (FDA approved for NASAL specimens only), is one component of a comprehensive MRSA colonization surveillance program. It is not intended to diagnose MRSA infection nor to guide or monitor treatment for MRSA infections. Test performance is  not FDA approved in patients less than 56 years old. Performed at [facility], [address] [place], [place] [address]   CULTURE, BLOOD (ROUTINE X 2) w Reflex to ID Panel     Status: None   Collection Time: 11/05/21  8:09 AM   Specimen: BLOOD  Result Value Ref Range Status   Specimen Description BLOOD BLOOD RIGHT WRIST  Final   Special Requests   Final    BOTTLES DRAWN AEROBIC ONLY Blood Culture adequate volume   Culture   Final    NO GROWTH 5 DAYS Performed at [facility], [address] [place], [place] [address]    Report Status 11/10/2021 FINAL  Final  CULTURE, BLOOD (ROUTINE X 2) w Reflex to ID Panel     Status: None   Collection Time: 11/05/21  8:09 AM   Specimen: BLOOD  Result Value Ref Range Status   Specimen Description BLOOD BLOOD RIGHT HAND  Final   Special Requests   Final    BOTTLES DRAWN AEROBIC AND ANAEROBIC Blood Culture adequate  volume   Culture   Final    NO GROWTH 5 DAYS Performed at [facility], [address] [place], [place] [address]    Report Status 11/10/2021 FINAL  Final  Urine Culture     Status: None   Collection Time: 11/07/21  3:51 PM   Specimen: Urine, Clean Catch  Result Value Ref Range Status   Specimen Description   Final    URINE, CLEAN CATCH Performed at [facility], [address], [place], [place] [address]    Special Requests   Final    NONE Performed at [facility], [address] [place], [place] [address]    Culture   Final    NO GROWTH Performed at [facility], [address], [place], [place] [address]    Report Status 11/09/2021 FINAL  Final    Coagulation Studies: No results for input(s): LABPROT, INR in the last 72 hours.  Urinalysis: No results for input(s): COLORURINE, LABSPEC, PHURINE, GLUCOSEU, HGBUR, BILIRUBINUR, KETONESUR, PROTEINUR, UROBILINOGEN, NITRITE, LEUKOCYTESUR in the last 72 hours.  Invalid input(s): APPERANCEUR     Imaging: DG Chest 2 View  Result Date: 11/09/2021 CLINICAL DATA:  Shortness of breath.  Mild chest pain.  Smoker. EXAM: CHEST - 2 VIEW COMPARISON:  11/05/2021 FINDINGS: Right PICC tip in the region of the inferior aspect of the superior vena cava without significant change. The nasogastric tube has been removed. Normal sized heart. Stable linear scarring in the right upper lobe. No significant change in a small right pleural effusion and right basilar airspace opacity. Clear left lung. Mildly tortuous and calcified thoracic aorta. No acute bony abnormality. IMPRESSION: Stable small right pleural effusion and right basilar atelectasis and/or pneumonia. Electronically Signed   By: Claudie Revering M.D.   On: 11/09/2021 10:17     Medications:    ampicillin-sulbactam (UNASYN) IV Stopped (11/10/21 0116)    sodium chloride   Intravenous Once   apixaban  2.5 mg Oral BID   Chlorhexidine Gluconate Cloth  6  each Topical Daily   citalopram  40 mg Oral BH-q7a   diltiazem  240 mg Oral Daily   famotidine  20 mg Oral Daily   feeding supplement  237 mL Oral TID BM   fluticasone furoate-vilanterol  1 puff Inhalation Daily   And   umeclidinium bromide  1 puff Inhalation Daily   furosemide  40 mg Intravenous Daily  gabapentin  300 mg Oral BID   metoCLOPramide (REGLAN) injection  5 mg Intravenous Q8H   pantoprazole (PROTONIX) IV  40 mg Intravenous QHS   sodium chloride flush  10-40 mL Intracatheter Q12H   Vitamin D (Ergocalciferol)  50,000 Units Oral Q7 days   albuterol, HYDROmorphone (DILAUDID) injection, lip balm, menthol-cetylpyridinium, metoprolol tartrate, ondansetron **OR** ondansetron (ZOFRAN) IV, oxyCODONE-acetaminophen, sodium chloride flush  Assessment/ Plan:  66 y.o. 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  Coronary artery disease, atherosclerosis, aortic stenosis, chronic atrial fibrillation   Principal Problem:   Ileostomy status ([place]) Active Problems:   Hyponatremia   Acute respiratory failure with hypoxia (HCC)   Weakness   Acute kidney injury superimposed on CKD (HCC)   HTN (hypertension)   AF (paroxysmal atrial fibrillation) (HCC)   Normocytic anemia   Depression   Acute on chronic diastolic CHF (congestive heart failure) (HCC)   Hyperphosphatemia   Hypomagnesemia   Anemia of chronic disease   Ileus following gastrointestinal surgery (HCC)   Drop in hemoglobin   Fever   Aspiration pneumonia of right lower lobe due to gastric secretions ([place])   #. 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 Baseline Creatnine 1.25 / GFR  on 10/30/2021 Recent Labs    11/07/21 0409 11/08/21 0337 11/09/21 0430 11/10/21 1024  CREATININE 1.96* 1.92* 2.21* 2.71*   AKI likely  multifactorial Urinalysis from 122 shows large leukocytes, no WBCs, suggesting ? interstitial nephritis.  We will still await urine culture. Blood pressure has also been low normal with most recent blood pressure 100/48.  Hypotension can contribute to worsening renal function. Currently getting Unasyn for possible right lung Aspiration pneumonia cxr 11/05/21 -Creatinine elevated with initiation of diuretic. Reduced Furosemide 40mg  IV to daily yesterday due to worsened renal function. Lungs have crackles, peripheral edema worsened and patient complaints of dyspnea at rest. Will increase Furosemide 40mg  IV to twice daily and will monitor renal function. Risk/Benefits evaluated and diuresis required at this time. Will evaluate daily.   #. Anemia of CKD  Lab Results  Component Value Date   HGB 7.8 (L) 11/10/2021   Patient has received blood transfusions this admission. We will continue to monitor during this admission.  #. SHPTH     Component Value Date/Time   PTH 152 (H) 10/30/2019 0435   Lab Results  Component Value Date   PHOS 5.0 (H) 11/10/2021  Will continue to monitor bone minerals Recommend ergocalciferol weekly  # S/p elective ileostomy reversal Hospital stay complicated by ileus TPN stopped due to toleration of oral intake We will continue to monitor fluid volume   LOS: [place] 1/27/202312:42 PM  [place], [place]

## 2021-11-10 NOTE — Progress Notes (Signed)
Mobility Specialist - Progress Note   11/10/21 1554  Mobility  Activity Refused mobility    Pt sleeping on arrival, awakens to gentle touch but then quickly returns to sleep. Unable to arouse a second time. Pt left in bed with needs in reach.    Kathee Delton Mobility Specialist 11/10/21, 3:55 PM

## 2021-11-10 NOTE — Care Management Important Message (Signed)
Important Message  Patient Details  Name: Danielle [name] MRN: [id number] Date of Birth: 01-Aug-1956   Medicare Important Message Given:  Yes     Dannette Donnette 11/10/2021, 11:02 AM

## 2021-11-11 LAB — RENAL FUNCTION PANEL
Albumin: 2.6 g/dL — ABNORMAL LOW (ref 3.5–5.0)
Anion gap: 9 (ref 5–15)
BUN: 62 mg/dL — ABNORMAL HIGH (ref 8–23)
CO2: 23 mmol/L (ref 22–32)
Calcium: 8.1 mg/dL — ABNORMAL LOW (ref 8.9–10.3)
Chloride: 104 mmol/L (ref 98–111)
Creatinine, Ser: 2.51 mg/dL — ABNORMAL HIGH (ref 0.44–1.00)
GFR, Estimated: 20 mL/min — ABNORMAL LOW (ref >60)
Glucose, Bld: 113 mg/dL — ABNORMAL HIGH (ref 70–99)
Phosphorus: 4 mg/dL (ref 2.5–4.6)
Potassium: 3.3 mmol/L — ABNORMAL LOW (ref 3.5–5.1)
Sodium: 136 mmol/L (ref 135–145)

## 2021-11-11 MED ORDER — METOCLOPRAMIDE HCL 5 MG/ML IJ SOLN
5.0000 mg | Freq: Two times a day (BID) | INTRAMUSCULAR | Status: DC
  Administered 2021-11-11 (×2): 5 mg via INTRAVENOUS
  Filled 2021-11-11 (×2): qty 2

## 2021-11-11 MED ORDER — POTASSIUM CHLORIDE 20 MEQ PO PACK
20.0000 meq | PACK | Freq: Two times a day (BID) | ORAL | Status: AC
  Administered 2021-11-11 (×2): 20 meq via ORAL
  Filled 2021-11-11 (×2): qty 1

## 2021-11-11 MED ORDER — PANTOPRAZOLE SODIUM 40 MG PO TBEC
40.0000 mg | DELAYED_RELEASE_TABLET | Freq: Every day | ORAL | Status: DC
  Administered 2021-11-11 – 2021-11-17 (×7): 40 mg via ORAL
  Filled 2021-11-11 (×7): qty 1

## 2021-11-11 NOTE — Progress Notes (Addendum)
PROGRESS NOTE    Danielle [name]  SVX:[id number] DOB: May 16, 1956 DOA: 10/23/2021 PCP: [facility]    Assessment & Plan:   Principal Problem:   Ileostomy status ([place]) Active Problems:   Hyponatremia   Acute respiratory failure with hypoxia ([place])   Weakness   Acute kidney injury superimposed on CKD (HCC)   HTN (hypertension)   AF (paroxysmal atrial fibrillation) (HCC)   Normocytic anemia   Depression   Acute on chronic diastolic CHF (congestive heart failure) (HCC)   Hyperphosphatemia   Hypomagnesemia   Anemia of chronic disease   Ileus following gastrointestinal surgery (HCC)   Drop in hemoglobin   Fever   Aspiration pneumonia of right lower lobe due to gastric secretions (HCC)   Aspiration pneumonia: continue on IV unasyn, bronchodilators & encourage incentive spirometry   ACD: H&H are labile. S/p 2 units of pRBCs transfused this admission   PAF: continue on metoprolol, cardizem, & eliquis   Postoperative ileus: s/p ileostomy reversal. NG tube has been d/c. TPN has been d/c. Continue on soft diet as per general surg    Acute hypoxic respiratory failure: pulse ox did drop down into the 80s on 3 L. Continue on supplemental oxygen and wean as tolerated. Intermittent dyspnea   AKI on CKDIIIb: Cr is trending down from day prior. Avoid nephrotoxic meds    Weakness: PT recs HH    DVT prophylaxis: eliquis  Code Status: full  Family Communication:  Disposition Plan: as per general surg recs   Level of care: Telemetry Medical  Status is: Inpatient  Remains inpatient appropriate because: as per general surg   Consultants:  Hospitalist  General surg (primary)  Procedures:   s/p ileostomy reversal.  Antimicrobials: unasyn    Subjective: Pt c/o malaise.   Objective: Vitals:   11/10/21 1535 11/10/21 2029 11/11/21 0358 11/11/21 0400  BP: 91/69 100/79 109/77   Pulse: 88 76 96   Resp: 18 16 16    Temp: 98.8 F (37.1 C) (!) 97.5 F (36.4 C)  97.7 F (36.5 C)   TempSrc: Oral Oral Oral   SpO2: 97% 95% 94%   Weight:    64 kg  Height:        Intake/Output Summary (Last 24 hours) at 11/11/2021 0748 Last data filed at 11/11/2021 0331 Gross per 24 hour  Intake 440 ml  Output 600 ml  Net -160 ml   Filed Weights   11/09/21 0500 11/10/21 0600 11/11/21 0400  Weight: 66.4 kg 65.1 kg 64 kg    Examination:  General exam: Appears comfortable   Respiratory system: decreased breath sounds b/l Cardiovascular system: S1/S2+. No rubs or gallops  Gastrointestinal system: Abd is soft, NT, ND & normal bowel sounds  Central nervous system: alert and oriented. Moves all extremities  Psychiatry: judgement and insight appears normal. Flat mood and affect    Data Reviewed: I have personally reviewed following labs and imaging studies  CBC: Recent Labs  Lab 11/05/21 0518 11/06/21 0443 11/07/21 0409 11/07/21 1633 11/08/21 0339 11/09/21 0911 11/10/21 1024  WBC 13.5* 12.9*  --   --   --  8.4 8.4  HGB 7.5* 7.4* 6.6* 7.9* 7.7* 7.7* 7.8*  HCT 22.5* 23.0*  --  25.3*  --  24.5* 24.8*  MCV 95.3 96.2  --   --   --  98.4 97.6  PLT 314 305  --   --   --  276 233   Basic Metabolic Panel: Recent Labs  Lab 11/05/21  3335 11/06/21 0443 11/07/21 0409 11/08/21 0337 11/09/21 0430 11/10/21 1024  NA 137 138 137 136 134* 135  K 4.6 4.1 4.1 4.0 4.4 3.8  CL 97* 102 104 104 103 102  CO2 29 27 25 23 24 22   GLUCOSE 128* 120* 101* 109* 108* 134*  BUN 50* 57* 59* 60* 69* 75*  CREATININE 1.73* 2.03* 1.96* 1.92* 2.21* 2.71*  CALCIUM 8.5* 8.9 8.8* 8.7* 8.4* 8.6*  MG  --  2.2  --   --  2.1  --   PHOS 3.7 3.4  --  4.4 5.0* 5.0*   GFR: Estimated Creatinine Clearance: 18.9 mL/min (A) (by C-G formula based on SCr of 2.71 mg/dL (H)). Liver Function Tests: Recent Labs  Lab 11/06/21 0443 11/08/21 0337 11/09/21 0430 11/10/21 1024  AST 16  --  21  --   ALT 9  --  18  --   ALKPHOS 50  --  60  --   BILITOT 2.2*  --  0.7  --   PROT 6.7  --  6.8  --    ALBUMIN 2.7* 2.6* 2.7* 2.8*   Recent Labs  Lab 11/05/21 0518  LIPASE 22   No results for input(s): AMMONIA in the last 168 hours. Coagulation Profile: No results for input(s): INR, PROTIME in the last 168 hours. Cardiac Enzymes: No results for input(s): CKTOTAL, CKMB, CKMBINDEX, TROPONINI in the last 168 hours. BNP (last 3 results) No results for input(s): PROBNP in the last 8760 hours. HbA1C: No results for input(s): HGBA1C in the last 72 hours. CBG: Recent Labs  Lab 11/08/21 1140 11/08/21 1800 11/08/21 2348 11/09/21 0609 11/09/21 1117  GLUCAP 127* 153* 121* 135* 127*   Lipid Profile: No results for input(s): CHOL, HDL, LDLCALC, TRIG, CHOLHDL, LDLDIRECT in the last 72 hours.  Thyroid Function Tests: No results for input(s): TSH, T4TOTAL, FREET4, T3FREE, THYROIDAB in the last 72 hours. Anemia Panel: No results for input(s): VITAMINB12, FOLATE, FERRITIN, TIBC, IRON, RETICCTPCT in the last 72 hours. Sepsis Labs: Recent Labs  Lab 11/05/21 0518 11/05/21 0809 11/06/21 0443  PROCALCITON <0.10  --  0.51  LATICACIDVEN 0.9 0.8  --     Recent Results (from the past 240 hour(s))  MRSA Next Gen by PCR, Nasal     Status: None   Collection Time: 11/05/21  5:56 AM   Specimen: Nasal Mucosa; Nasal Swab  Result Value Ref Range Status   MRSA by PCR Next Gen NOT DETECTED NOT DETECTED Final    Comment: (NOTE) The GeneXpert MRSA Assay (FDA approved for NASAL specimens only), is one component of a comprehensive MRSA colonization surveillance program. It is not intended to diagnose MRSA infection nor to guide or monitor treatment for MRSA infections. Test performance is not FDA approved in patients less than 3 years old. Performed at [facility], [address] [place], [place] [address]   CULTURE, BLOOD (ROUTINE X 2) w Reflex to ID Panel     Status: None   Collection Time: 11/05/21  8:09 AM   Specimen: BLOOD  Result Value Ref Range Status   Specimen Description  BLOOD BLOOD RIGHT WRIST  Final   Special Requests   Final    BOTTLES DRAWN AEROBIC ONLY Blood Culture adequate volume   Culture   Final    NO GROWTH 5 DAYS Performed at [facility], [address], [place], [place] [address]    Report Status 11/10/2021 FINAL  Final  CULTURE, BLOOD (ROUTINE X 2) w Reflex  to ID Panel     Status: None   Collection Time: 11/05/21  8:09 AM   Specimen: BLOOD  Result Value Ref Range Status   Specimen Description BLOOD BLOOD RIGHT HAND  Final   Special Requests   Final    BOTTLES DRAWN AEROBIC AND ANAEROBIC Blood Culture adequate volume   Culture   Final    NO GROWTH 5 DAYS Performed at [facility], [address], [place], [place] [address]    Report Status 11/10/2021 FINAL  Final  Urine Culture     Status: None   Collection Time: 11/07/21  3:51 PM   Specimen: Urine, Clean Catch  Result Value Ref Range Status   Specimen Description   Final    URINE, CLEAN CATCH Performed at [facility], [address], [place], [place] [address]    Special Requests   Final    NONE Performed at [facility], [address] [place], [place] [address]    Culture   Final    NO GROWTH Performed at [facility], [place] [address], [place], [place] [address]    Report Status 11/09/2021 FINAL  Final         Radiology Studies: DG Chest 2 View  Result Date: 11/09/2021 CLINICAL DATA:  Shortness of breath.  Mild chest pain.  Smoker. EXAM: CHEST - 2 VIEW COMPARISON:  11/05/2021 FINDINGS: Right PICC tip in the region of the inferior aspect of the superior vena cava without significant change. The nasogastric tube has been removed. Normal sized heart. Stable linear scarring in the right upper lobe. No significant change in a small right pleural effusion and right basilar airspace opacity. Clear left lung. Mildly tortuous and calcified thoracic aorta. No acute bony abnormality. IMPRESSION: Stable small right pleural effusion and  right basilar atelectasis and/or pneumonia. Electronically Signed   By: Claudie Revering M.D.   On: 11/09/2021 10:17        Scheduled Meds:  sodium chloride   Intravenous Once   apixaban  2.5 mg Oral BID   Chlorhexidine Gluconate Cloth  6 each Topical Daily   citalopram  40 mg Oral BH-q7a   diltiazem  240 mg Oral Daily   famotidine  20 mg Oral Daily   feeding supplement  237 mL Oral TID BM   fluticasone furoate-vilanterol  1 puff Inhalation Daily   And   umeclidinium bromide  1 puff Inhalation Daily   furosemide  40 mg Intravenous BID   gabapentin  300 mg Oral BID   metoCLOPramide (REGLAN) injection  5 mg Intravenous Q8H   pantoprazole (PROTONIX) IV  40 mg Intravenous QHS   sodium chloride flush  10-40 mL Intracatheter Q12H   Vitamin D (Ergocalciferol)  50,000 Units Oral Q7 days   Continuous Infusions:  ampicillin-sulbactam (UNASYN) IV Stopped (11/10/21 2332)     LOS: 19 days    Time spent: 15 mins     Wyvonnia Dusky, MD [facility] Pager [phone]  If 7PM-7AM, please contact night-coverage 11/11/2021, 7:48 AM

## 2021-11-11 NOTE — Progress Notes (Signed)
[facility], [place] 11/11/21  Subjective:   LOS: 19 01/27 0701 - 01/28 0700 In: 440 [P.O.:240; IV Piggyback:200] Out: 600 [Urine:600]  Patient known to our practice from outpatient. Followed by Dr Holley Raring. Medical problems of chronic kidney disease, anemia of chronic kidney disease, secondary hyperparathyroidism, hypertension  Patient was seen today on second floor Patient family was present in the room Patient offers no new specific physical complaints  Objective:  Vital signs in last 24 hours:  Temp:  [97.5 F (36.4 C)-98.8 F (37.1 C)] 97.8 F (36.6 C) (01/28 0750) Pulse Rate:  [76-96] 86 (01/28 0750) Resp:  [16-20] 20 (01/28 0750) BP: (91-109)/(69-79) 104/77 (01/28 0750) SpO2:  [94 %-97 %] 95 % (01/28 0750) Weight:  [64 kg] 64 kg (01/28 0400)  Weight change: -1.091 kg Filed Weights   11/09/21 0500 11/10/21 0600 11/11/21 0400  Weight: 66.4 kg 65.1 kg 64 kg    Intake/Output:    Intake/Output Summary (Last 24 hours) at 11/11/2021 1512 Last data filed at 11/11/2021 0331 Gross per 24 hour  Intake 200 ml  Output 600 ml  Net -400 ml     Physical Exam: General: NAD elderly female, laying in the bed  HEENT Moist oral mucosa  Pulm/lungs Diminished in bases, minimal rhonchi, normal effort, [place] O2  CVS/Heart No rub or gallop  Abdomen:  Soft, bowel sounds present in right lower quadrant  Extremities: trace+ peripheral edema  Neurologic: Alert, able to answer questions appropriately  Skin: Warm, dry          Basic Metabolic Panel:  Recent Labs  Lab 11/06/21 0443 11/07/21 0409 11/08/21 0337 11/09/21 0430 11/10/21 1024 11/11/21 1159  NA 138 137 136 134* 135 136  K 4.1 4.1 4.0 4.4 3.8 3.3*  CL 102 104 104 103 102 104  CO2 27 25 23 24 22 23   GLUCOSE 120* 101* 109* 108* 134* 113*  BUN 57* 59* 60* 69* 75* 62*  CREATININE 2.03* 1.96* 1.92* 2.21* 2.71* 2.51*  CALCIUM 8.9 8.8* 8.7* 8.4* 8.6* 8.1*  MG 2.2  --   --  2.1  --   --    PHOS 3.4  --  4.4 5.0* 5.0* 4.0     CBC: Recent Labs  Lab 11/05/21 0518 11/06/21 0443 11/07/21 0409 11/07/21 1633 11/08/21 0339 11/09/21 0911 11/10/21 1024  WBC 13.5* 12.9*  --   --   --  8.4 8.4  HGB 7.5* 7.4* 6.6* 7.9* 7.7* 7.7* 7.8*  HCT 22.5* 23.0*  --  25.3*  --  24.5* 24.8*  MCV 95.3 96.2  --   --   --  98.4 97.6  PLT 314 305  --   --   --  276 294     No results found for: HEPBSAG, HEPBSAB, HEPBIGM    Microbiology:  Recent Results (from the past 240 hour(s))  MRSA Next Gen by PCR, Nasal     Status: None   Collection Time: 11/05/21  5:56 AM   Specimen: Nasal Mucosa; Nasal Swab  Result Value Ref Range Status   MRSA by PCR Next Gen NOT DETECTED NOT DETECTED Final    Comment: (NOTE) The GeneXpert MRSA Assay (FDA approved for NASAL specimens only), is one component of a comprehensive MRSA colonization surveillance program. It is not intended to diagnose MRSA infection nor to guide or monitor treatment for MRSA infections. Test performance is not FDA approved in patients less than 46 years old. Performed at [facility], [address] [place], [place] [address]   CULTURE, BLOOD (ROUTINE X 2) w Reflex to ID Panel     Status: None   Collection Time: 11/05/21  8:09 AM   Specimen: BLOOD  Result Value Ref Range Status   Specimen Description BLOOD BLOOD RIGHT WRIST  Final   Special Requests   Final    BOTTLES DRAWN AEROBIC ONLY Blood Culture adequate volume   Culture   Final    NO GROWTH 5 DAYS Performed at [facility], [address] [place], [place] [address]    Report Status 11/10/2021 FINAL  Final  CULTURE, BLOOD (ROUTINE X 2) w Reflex to ID Panel     Status: None   Collection Time: 11/05/21  8:09 AM   Specimen: BLOOD  Result Value Ref Range Status   Specimen Description BLOOD BLOOD RIGHT HAND  Final   Special Requests   Final    BOTTLES DRAWN AEROBIC AND ANAEROBIC Blood Culture adequate volume   Culture   Final    NO GROWTH 5  DAYS Performed at [facility], [address] [place], [place] [address]    Report Status 11/10/2021 FINAL  Final  Urine Culture     Status: None   Collection Time: 11/07/21  3:51 PM   Specimen: Urine, Clean Catch  Result Value Ref Range Status   Specimen Description   Final    URINE, CLEAN CATCH Performed at [facility], [address] [place], [place] [address]    Special Requests   Final    NONE Performed at [facility], [address] [place], [place] [address]    Culture   Final    NO GROWTH Performed at [facility], [place] [address] [place],  [address]    Report Status 11/09/2021 FINAL  Final    Coagulation Studies: No results for input(s): LABPROT, INR in the last 72 hours.  Urinalysis: No results for input(s): COLORURINE, LABSPEC, PHURINE, GLUCOSEU, HGBUR, BILIRUBINUR, KETONESUR, PROTEINUR, UROBILINOGEN, NITRITE, LEUKOCYTESUR in the last 72 hours.  Invalid input(s): APPERANCEUR     Imaging: No results found.   Medications:    ampicillin-sulbactam (UNASYN) IV 3 g (11/11/21 1203)    apixaban  2.5 mg Oral BID   Chlorhexidine Gluconate Cloth  6 each Topical Daily   citalopram  40 mg Oral BH-q7a   diltiazem  240 mg Oral Daily   famotidine  20 mg Oral Daily   feeding supplement  237 mL Oral TID BM   fluticasone furoate-vilanterol  1 puff Inhalation Daily   And   umeclidinium bromide  1 puff Inhalation Daily   furosemide  40 mg Intravenous BID   gabapentin  300 mg Oral BID   metoCLOPramide (REGLAN) injection  5 mg Intravenous Q12H   pantoprazole  40 mg Oral QHS   sodium chloride flush  10-40 mL Intracatheter Q12H   Vitamin D (Ergocalciferol)  50,000 Units Oral Q7 days   albuterol, HYDROmorphone (DILAUDID) injection, lip balm, menthol-cetylpyridinium, metoprolol tartrate, ondansetron **OR** ondansetron (ZOFRAN) IV, oxyCODONE-acetaminophen, sodium chloride flush  Assessment/ Plan:  66 y.o. female with COPD,  chronic systolic CHF, hypertension, anemia, GERD, depression, history of breast cancer treated with mastectomy, chemo, radiation; lung cancer, history of colonic stricture status post hemi-colectomy, lower esophageal cancer 2020 ongoing radiation.  Coronary artery disease, atherosclerosis, aortic stenosis, chronic atrial fibrillation   Principal Problem:   Ileostomy status ([place]) Active Problems:   Hyponatremia   Acute respiratory failure with hypoxia ([place])  Weakness   Acute kidney injury superimposed on CKD (HCC)   HTN (hypertension)   AF (paroxysmal atrial fibrillation) (HCC)   Normocytic anemia   Depression   Acute on chronic diastolic CHF (congestive heart failure) (HCC)   Hyperphosphatemia   Hypomagnesemia   Anemia of chronic disease   Ileus following gastrointestinal surgery (HCC)   Drop in hemoglobin   Fever   Aspiration pneumonia of right lower lobe due to gastric secretions ([place])   #. 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 Baseline Creatnine 1.25 / GFR  on 10/30/2021 Recent Labs    11/08/21 7517 11/09/21 0430 11/10/21 1024 11/11/21 1159  CREATININE 1.92* 2.21* 2.71* 2.51*  AKI likely multifactorial ATN versus AIN   Data in favor of AIN Urinalysis from 1/22 /23 shows large leukocytes, no WBCs, suggesting ? interstitial nephritis.   Patient urine culture so far has been negative Patient creatinine has been rising after initiation of Unasyn Unasyn was initiated on November 06, 2021 patient creatinine was 1.9 on 25 January, 2.2 on 26 January and now 2.7 on 27 January Creatinine improved-Unasyn has now been DC'd  Data in favor of ATN Blood pressure has also been low .  Hypotension can contribute to worsening renal function. -Creatinine elevated with initiation of diuretic. Patient's diuretics were increased from furosemide 40mg  IV to daily since yesterday to furosemide 40mg  IV to twice daily . Creatinine improved after we lowered the diuretics  Patient is frail to undergo kidney  biopsy We will follow  #. Anemia of CKD  Lab Results  Component Value Date   HGB 7.8 (L) 11/10/2021   Patient has received blood transfusions this admission. We will continue to monitor during this admission.  #. SHPTH     Component Value Date/Time   PTH 152 (H) 10/30/2019 0435   Lab Results  Component Value Date   PHOS 4.0 11/11/2021  Will continue to monitor bone minerals Recommend ergocalciferol weekly  # S/p elective ileostomy reversal Hospital stay complicated by ileus TPN stopped due to toleration of oral intake We will continue to monitor fluid volume  #Hypokalemia Secondary to IV diuresis Will replace potassium   LOS: [place] [facility] 1/28/20233:12 PM  [place], [place]

## 2021-11-11 NOTE — Progress Notes (Signed)
PHARMACIST - PHYSICIAN COMMUNICATION  CONCERNING: IV to Oral Route Change Policy  RECOMMENDATION: This patient is receiving pantoprazole by the intravenous route.  Based on criteria approved by the Pharmacy and Therapeutics Committee, the intravenous medication(s) is/are being converted to the equivalent oral dose form(s).   DESCRIPTION: These criteria include: The patient is eating (either orally or via tube) and/or has been taking other orally administered medications for a least 24 hours The patient has no evidence of active gastrointestinal bleeding or impaired GI absorption (gastrectomy, short bowel, patient on TNA or NPO).  If you have questions about this conversion, please contact the [place], [facility] 11/11/2021 10:05 AM

## 2021-11-11 NOTE — Progress Notes (Signed)
Mobility Specialist - Progress Note   11/11/21 1400  Mobility  Activity Ambulated independently in hallway  Range of Motion/Exercises Active  Level of Assistance Standby assist, set-up cues, supervision of patient - no hands on  Assistive Device Front wheel walker  Distance Ambulated (ft) 60 ft  Activity Response Tolerated well  Transport method Ambulatory  $Mobility charge 1 Mobility    Pre-mobility: 114 HR, 90% SpO2 During mobility: 136-145 HR, 86% SpO2 Post-mobility: 117 HR, 90% SpO2   Pt lying in bed upon arrival, utilizing 4L. O2 bumped up to 6L for OOB activity. Pt ambulated in hallway with supervision. SOB with exertion. O2 desat to 86%. Further ambulation deferred d/t HR increasing to 130-140s. Pt returned semi-supine with alarm set, needs in reach.    Kathee Delton Mobility Specialist 11/11/21, 3:28 PM

## 2021-11-11 NOTE — Progress Notes (Signed)
Patient ID: Danielle [name], female   DOB: 09-24-1956, 66 y.o.   MRN: [id number]     [place] Hospital Day(s): 19.   Interval History: Patient seen and examined, no acute events or new complaints overnight. Patient reports feeling better today.  She is more awake and more comfortable.  Still without shortness of breath on exertion.  Denies abdominal pain.  Denies any nausea or vomiting.  Endorses continued having bowel movement and passing gas.  Denies feeling bloated.  Vital signs in last 24 hours: [min-max] current  Temp:  [97.5 F (36.4 C)-98.8 F (37.1 C)] 97.8 F (36.6 C) (01/28 0750) Pulse Rate:  [76-96] 86 (01/28 0750) Resp:  [16-20] 20 (01/28 0750) BP: (91-109)/(69-79) 104/77 (01/28 0750) SpO2:  [94 %-97 %] 95 % (01/28 0750) Weight:  [64 kg] 64 kg (01/28 0400)     Height: 5\' 6"  (167.6 cm) Weight: 64 kg BMI (Calculated): 22.78   Physical Exam:  Constitutional: alert, cooperative and no distress  Respiratory: breathing non-labored at rest with nasal cannula Cardiovascular: Irregular rhythm Gastrointestinal: soft, non-tender, and non-distended.  Wounds are dry and clean  Labs:  CBC Latest Ref Rng & Units 11/10/2021 11/09/2021 11/08/2021  WBC 4.0 - 10.5 K/uL 8.4 8.4 -  Hemoglobin 12.0 - 15.0 g/dL 7.8(L) 7.7(L) 7.7(L)  Hematocrit 36.0 - 46.0 % 24.8(L) 24.5(L) -  Platelets 150 - 400 K/uL 294 276 -   CMP Latest Ref Rng & Units 11/10/2021 11/09/2021 11/08/2021  Glucose 70 - 99 mg/dL 134(H) 108(H) 109(H)  BUN 8 - 23 mg/dL 75(H) 69(H) 60(H)  Creatinine 0.44 - 1.00 mg/dL 2.71(H) 2.21(H) 1.92(H)  Sodium 135 - 145 mmol/L 135 134(L) 136  Potassium 3.5 - 5.1 mmol/L 3.8 4.4 4.0  Chloride 98 - 111 mmol/L 102 103 104  CO2 22 - 32 mmol/L 22 24 23   Calcium 8.9 - 10.3 mg/dL 8.6(L) 8.4(L) 8.7(L)  Total Protein 6.5 - 8.1 g/dL - 6.8 -  Total Bilirubin 0.3 - 1.2 mg/dL - 0.7 -  Alkaline Phos 38 - 126 U/L - 60 -  AST 15 - 41 U/L - 21 -  ALT 0 - 44 U/L - 18 -    Imaging studies: No  new pertinent imaging studies   Assessment/Plan:  66 y.o. female with history of an ileostomy and mucous fistula 19 Day Post-Op s/p ileostomy reversal, complicated by pertinent comorbidities including COPD, atrial fibrillation on anticoagulation, hypertension, chronic kidney disease stage III.   Status post ileostomy reversal -Patient initially with prolonged postoperative ileus that resolved. -TPN discontinued -Tolerating soft diet well -Passing gas and having bowel movements -We will decrease Reglan frequency     Chronic kidney disease stage III -Acute kidney injury after surgery.   -Initially creatinine decreased to baseline but now increasing trend again. -I order new renal function panel to follow-up creatinine and electrolytes -Nephrology managing with diuretics.  Shortness of breath -Could be multifactorial due to consideration on right lung on last chest x-ray.  Also with small pleural effusion -Currently on Unasyn for treatment right lung pneumonia -Currently on diuretics for treatment of pleural effusions -Requiring nasal cannula at 4L/min -Continues to need oxygen at home as needed but baseline was 2 L/min -Appreciate nephrology and cardiology for management of current medical comorbidities  A. Fib -Intermittent rate control -Currently on therapeutic anticoagulation -Appreciate hospitalist and cardiologist evaluation and management   Encourage patient to get out of bed.  Will continue pain management.   Arnold Long, MD

## 2021-11-12 ENCOUNTER — Inpatient Hospital Stay: Payer: Medicare Other

## 2021-11-12 DIAGNOSIS — J90 Pleural effusion, not elsewhere classified: Secondary | ICD-10-CM

## 2021-11-12 LAB — BASIC METABOLIC PANEL
Anion gap: 8 (ref 5–15)
BUN: 55 mg/dL — ABNORMAL HIGH (ref 8–23)
CO2: 23 mmol/L (ref 22–32)
Calcium: 8.3 mg/dL — ABNORMAL LOW (ref 8.9–10.3)
Chloride: 104 mmol/L (ref 98–111)
Creatinine, Ser: 2.25 mg/dL — ABNORMAL HIGH (ref 0.44–1.00)
GFR, Estimated: 23 mL/min — ABNORMAL LOW (ref >60)
Glucose, Bld: 122 mg/dL — ABNORMAL HIGH (ref 70–99)
Potassium: 3.7 mmol/L (ref 3.5–5.1)
Sodium: 135 mmol/L (ref 135–145)

## 2021-11-12 MED ORDER — SALINE SPRAY 0.65 % NA SOLN
1.0000 | NASAL | Status: DC | PRN
  Administered 2021-11-12: 1 via NASAL
  Filled 2021-11-12: qty 44

## 2021-11-12 NOTE — Plan of Care (Signed)

## 2021-11-12 NOTE — Progress Notes (Signed)
PROGRESS NOTE    Danielle [name]  OFB:[id number] DOB: 1956/05/05 DOA: 10/23/2021 PCP: [facility]    Assessment & Plan:   Principal Problem:   Ileostomy status ([place]) Active Problems:   Hyponatremia   Acute respiratory failure with hypoxia ([place])   Weakness   Acute kidney injury superimposed on CKD (HCC)   HTN (hypertension)   AF (paroxysmal atrial fibrillation) (HCC)   Normocytic anemia   Depression   Acute on chronic diastolic CHF (congestive heart failure) (HCC)   Hyperphosphatemia   Hypomagnesemia   Anemia of chronic disease   Ileus following gastrointestinal surgery (HCC)   Drop in hemoglobin   Fever   Aspiration pneumonia of right lower lobe due to gastric secretions (HCC)   Aspiration pneumonia: continue on IV unasyn, bronchodilators & incentive spirometry   ACD: H&H are labile. S/p 2 units of pRBCs transfused this admission.   PAF: continue on cardizem, metoprolol & eliquis. Will likely have to hold eliquis for thoracentesis    Postoperative ileus: s/p ileostomy reversal. NG tube has been d/c. TPN has been d/c. Continue on soft diet as per general surg    Acute hypoxic respiratory failure: pulse ox did drop down into the 80s on 3 L. Repeat CXR showed increased right pleural effusion. US thoracentesis ordered w/ fluid studies.   AKI on CKDIIIb: Cr continues to trend down. Avoid nephrotoxic meds   Weakness: PT recs HH    DVT prophylaxis: eliquis  Code Status: full  Family Communication:  Disposition Plan: as per general surg recs   Level of care: Telemetry Medical  Status is: Inpatient  Remains inpatient appropriate because: as per general surg   Consultants:  Hospitalist  General surg (primary)  Procedures:   s/p ileostomy reversal.  Antimicrobials: unasyn    Subjective: Pt c/o shortness of breath   Objective: Vitals:   11/12/21 0300 11/12/21 0341 11/12/21 0500 11/12/21 0510  BP:    112/80  Pulse:    (!) 101  Resp:    20   Temp:    98.3 F (36.8 C)  TempSrc:    Oral  SpO2: 95% 90% 96% 95%  Weight:      Height:        Intake/Output Summary (Last 24 hours) at 11/12/2021 0808 Last data filed at 11/12/2021 0559 Gross per 24 hour  Intake 680 ml  Output 1850 ml  Net -1170 ml   Filed Weights   11/09/21 0500 11/10/21 0600 11/11/21 0400  Weight: 66.4 kg 65.1 kg 64 kg    Examination:  General exam: Appears calm but uncomfortable   Respiratory system: diminished breath sounds b/l  Cardiovascular system: S1 & S2+. No rubs or clicks  Gastrointestinal system: Abd is soft, NT, ND & normal bowel sounds  Central nervous system: alert and oriented. Moves all extremities  Psychiatry: judgement and insight appears normal. Flat mood and affect     Data Reviewed: I have personally reviewed following labs and imaging studies  CBC: Recent Labs  Lab 11/06/21 0443 11/07/21 0409 11/07/21 1633 11/08/21 0339 11/09/21 0911 11/10/21 1024  WBC 12.9*  --   --   --  8.4 8.4  HGB 7.4* 6.6* 7.9* 7.7* 7.7* 7.8*  HCT 23.0*  --  25.3*  --  24.5* 24.8*  MCV 96.2  --   --   --  98.4 97.6  PLT 305  --   --   --  276 782   Basic Metabolic Panel: Recent Labs  Lab 11/06/21 0443 11/07/21 0409 11/08/21 0337 11/09/21 0430 11/10/21 1024 11/11/21 1159  NA 138 137 136 134* 135 136  K 4.1 4.1 4.0 4.4 3.8 3.3*  CL 102 104 104 103 102 104  CO2 27 25 23 24 22 23   GLUCOSE 120* 101* 109* 108* 134* 113*  BUN 57* 59* 60* 69* 75* 62*  CREATININE 2.03* 1.96* 1.92* 2.21* 2.71* 2.51*  CALCIUM 8.9 8.8* 8.7* 8.4* 8.6* 8.1*  MG 2.2  --   --  2.1  --   --   PHOS 3.4  --  4.4 5.0* 5.0* 4.0   GFR: Estimated Creatinine Clearance: 20.4 mL/min (A) (by C-G formula based on SCr of 2.51 mg/dL (H)). Liver Function Tests: Recent Labs  Lab 11/06/21 0443 11/08/21 0337 11/09/21 0430 11/10/21 1024 11/11/21 1159  AST 16  --  21  --   --   ALT 9  --  18  --   --   ALKPHOS 50  --  60  --   --   BILITOT 2.2*  --  0.7  --   --   PROT 6.7   --  6.8  --   --   ALBUMIN 2.7* 2.6* 2.7* 2.8* 2.6*   No results for input(s): LIPASE, AMYLASE in the last 168 hours.  No results for input(s): AMMONIA in the last 168 hours. Coagulation Profile: No results for input(s): INR, PROTIME in the last 168 hours. Cardiac Enzymes: No results for input(s): CKTOTAL, CKMB, CKMBINDEX, TROPONINI in the last 168 hours. BNP (last 3 results) No results for input(s): PROBNP in the last 8760 hours. HbA1C: No results for input(s): HGBA1C in the last 72 hours. CBG: Recent Labs  Lab 11/08/21 1140 11/08/21 1800 11/08/21 2348 11/09/21 0609 11/09/21 1117  GLUCAP 127* 153* 121* 135* 127*   Lipid Profile: No results for input(s): CHOL, HDL, LDLCALC, TRIG, CHOLHDL, LDLDIRECT in the last 72 hours.  Thyroid Function Tests: No results for input(s): TSH, T4TOTAL, FREET4, T3FREE, THYROIDAB in the last 72 hours. Anemia Panel: No results for input(s): VITAMINB12, FOLATE, FERRITIN, TIBC, IRON, RETICCTPCT in the last 72 hours. Sepsis Labs: Recent Labs  Lab 11/05/21 0809 11/06/21 0443  PROCALCITON  --  0.51  LATICACIDVEN 0.8  --     Recent Results (from the past 240 hour(s))  MRSA Next Gen by PCR, Nasal     Status: None   Collection Time: 11/05/21  5:56 AM   Specimen: Nasal Mucosa; Nasal Swab  Result Value Ref Range Status   MRSA by PCR Next Gen NOT DETECTED NOT DETECTED Final    Comment: (NOTE) The GeneXpert MRSA Assay (FDA approved for NASAL specimens only), is one component of a comprehensive MRSA colonization surveillance program. It is not intended to diagnose MRSA infection nor to guide or monitor treatment for MRSA infections. Test performance is not FDA approved in patients less than 80 years old. Performed at [facility], [place], [address] [place], [place] [address]   CULTURE, BLOOD (ROUTINE X 2) w Reflex to ID Panel     Status: None   Collection Time: 11/05/21  8:09 AM   Specimen: BLOOD  Result Value Ref Range Status    Specimen Description BLOOD BLOOD RIGHT WRIST  Final   Special Requests   Final    BOTTLES DRAWN AEROBIC ONLY Blood Culture adequate volume   Culture   Final    NO GROWTH 5 DAYS Performed at [facility], [address] [place], [place] [address]  Report Status 11/10/2021 FINAL  Final  CULTURE, BLOOD (ROUTINE X 2) w Reflex to ID Panel     Status: None   Collection Time: 11/05/21  8:09 AM   Specimen: BLOOD  Result Value Ref Range Status   Specimen Description BLOOD BLOOD RIGHT HAND  Final   Special Requests   Final    BOTTLES DRAWN AEROBIC AND ANAEROBIC Blood Culture adequate volume   Culture   Final    NO GROWTH 5 DAYS Performed at [facility], [address], [place], [place] [address]    Report Status 11/10/2021 FINAL  Final  Urine Culture     Status: None   Collection Time: 11/07/21  3:51 PM   Specimen: Urine, Clean Catch  Result Value Ref Range Status   Specimen Description   Final    URINE, CLEAN CATCH Performed at [facility], [address] [place], [place] [address]    Special Requests   Final    NONE Performed at [facility], [address] [place], [place] [address]    Culture   Final    NO GROWTH Performed at [facility], [place] [address], [place], [place] [address]    Report Status 11/09/2021 FINAL  Final         Radiology Studies: No results found.      Scheduled Meds:  apixaban  2.5 mg Oral BID   Chlorhexidine Gluconate Cloth  6 each Topical Daily   citalopram  40 mg Oral BH-q7a   diltiazem  240 mg Oral Daily   famotidine  20 mg Oral Daily   feeding supplement  237 mL Oral TID BM   fluticasone furoate-vilanterol  1 puff Inhalation Daily   And   umeclidinium bromide  1 puff Inhalation Daily   furosemide  40 mg Intravenous BID   gabapentin  300 mg Oral BID   metoCLOPramide (REGLAN) injection  5 mg Intravenous Q12H   pantoprazole  40 mg Oral QHS   sodium chloride flush  10-40 mL  Intracatheter Q12H   Vitamin D (Ergocalciferol)  50,000 Units Oral Q7 days   Continuous Infusions:  ampicillin-sulbactam (UNASYN) IV Stopped (11/12/21 0038)     LOS: 20 days    Time spent: 15 mins     Wyvonnia Dusky, MD [facility] Pager [phone]  If 7PM-7AM, please contact night-coverage 11/12/2021, 8:08 AM

## 2021-11-12 NOTE — Plan of Care (Signed)
°  Problem: Clinical Measurements: Goal: Ability to maintain clinical measurements within normal limits will improve Outcome: Progressing Goal: Will remain free from infection Outcome: Progressing Goal: Diagnostic test results will improve Outcome: Progressing Goal: Respiratory complications will improve Outcome: Progressing Goal: Cardiovascular complication will be avoided Outcome: Progressing   Problem: Pain Managment: Goal: General experience of comfort will improve Outcome: Progressing   Pt is involved in and agrees with the plan of care. V/S stable. Denies any pain at this time. Complained of shortness of breath this shift. Oxygen saturation at 96% on 3.5 lpm/[place]. Breathing tx given. Had episodes of nose bleeding with clots after blowing nose. Pt oxygen on humidifier.

## 2021-11-12 NOTE — Progress Notes (Signed)
Cross Cover  Nurse reports patient complaints of SOB. DG chest ordered Nosebleed - advised to place humidification of supplemental oxygen

## 2021-11-12 NOTE — Progress Notes (Signed)
[facility], [place] 11/12/21  Subjective:   LOS: 20 01/28 0701 - 01/29 0700 In: 680 [P.O.:480; IV Piggyback:200] Out: 3710 [Urine:1850]  Patient known to our practice from outpatient. Followed by Dr Holley Raring. Medical problems of chronic kidney disease, anemia of chronic kidney disease, secondary hyperparathyroidism, hypertension  Patient was seen today on second floor Patient family was present in the room Patient offers no new specific physical complaints  Objective:  Vital signs in last 24 hours:  Temp:  [98.1 F (36.7 C)-98.7 F (37.1 C)] 98.1 F (36.7 C) (01/29 0812) Pulse Rate:  [61-101] 61 (01/29 0812) Resp:  [16-20] 16 (01/29 0812) BP: (91-112)/(63-80) 97/74 (01/29 0812) SpO2:  [90 %-96 %] 96 % (01/29 0812)  Weight change:  Filed Weights   11/09/21 0500 11/10/21 0600 11/11/21 0400  Weight: 66.4 kg 65.1 kg 64 kg    Intake/Output:    Intake/Output Summary (Last 24 hours) at 11/12/2021 1353 Last data filed at 11/12/2021 0559 Gross per 24 hour  Intake 680 ml  Output 1850 ml  Net -1170 ml     Physical Exam: General: NAD elderly female, laying in the bed  HEENT Moist oral mucosa  Pulm/lungs Diminished in bases, minimal rhonchi, normal effort, [place] O2  CVS/Heart No rub or gallop  Abdomen:  Soft, bowel sounds present in right lower quadrant  Extremities: trace+ peripheral edema  Neurologic: Alert, able to answer questions appropriately  Skin: Warm, dry          Basic Metabolic Panel:  Recent Labs  Lab 11/06/21 0443 11/07/21 0409 11/08/21 0337 11/09/21 0430 11/10/21 1024 11/11/21 1159 11/12/21 1000  NA 138   < > 136 134* 135 136 135  K 4.1   < > 4.0 4.4 3.8 3.3* 3.7  CL 102   < > 104 103 102 104 104  CO2 27   < > 23 24 22 23 23   GLUCOSE 120*   < > 109* 108* 134* 113* 122*  BUN 57*   < > 60* 69* 75* 62* 55*  CREATININE 2.03*   < > 1.92* 2.21* 2.71* 2.51* 2.25*  CALCIUM 8.9   < > 8.7* 8.4* 8.6* 8.1* 8.3*  MG 2.2  --   --   2.1  --   --   --   PHOS 3.4  --  4.4 5.0* 5.0* 4.0  --    < > = values in this interval not displayed.     CBC: Recent Labs  Lab 11/06/21 0443 11/07/21 0409 11/07/21 1633 11/08/21 0339 11/09/21 0911 11/10/21 1024  WBC 12.9*  --   --   --  8.4 8.4  HGB 7.4* 6.6* 7.9* 7.7* 7.7* 7.8*  HCT 23.0*  --  25.3*  --  24.5* 24.8*  MCV 96.2  --   --   --  98.4 97.6  PLT 305  --   --   --  276 294     No results found for: HEPBSAG, HEPBSAB, HEPBIGM    Microbiology:  Recent Results (from the past 240 hour(s))  MRSA Next Gen by PCR, Nasal     Status: None   Collection Time: 11/05/21  5:56 AM   Specimen: Nasal Mucosa; Nasal Swab  Result Value Ref Range Status   MRSA by PCR Next Gen NOT DETECTED NOT DETECTED Final    Comment: (NOTE) The GeneXpert MRSA Assay (FDA approved for NASAL specimens only), is one component of a comprehensive MRSA colonization surveillance program. It is not  intended to diagnose MRSA infection nor to guide or monitor treatment for MRSA infections. Test performance is not FDA approved in patients less than 85 years old. Performed at [facility], [address] [place], [place] [address]   CULTURE, BLOOD (ROUTINE X 2) w Reflex to ID Panel     Status: None   Collection Time: 11/05/21  8:09 AM   Specimen: BLOOD  Result Value Ref Range Status   Specimen Description BLOOD BLOOD RIGHT WRIST  Final   Special Requests   Final    BOTTLES DRAWN AEROBIC ONLY Blood Culture adequate volume   Culture   Final    NO GROWTH 5 DAYS Performed at [facility], [address] [place], [place] [address]    Report Status 11/10/2021 FINAL  Final  CULTURE, BLOOD (ROUTINE X 2) w Reflex to ID Panel     Status: None   Collection Time: 11/05/21  8:09 AM   Specimen: BLOOD  Result Value Ref Range Status   Specimen Description BLOOD BLOOD RIGHT HAND  Final   Special Requests   Final    BOTTLES DRAWN AEROBIC AND ANAEROBIC Blood Culture adequate volume    Culture   Final    NO GROWTH 5 DAYS Performed at [facility], [address] [place], [place] [address]    Report Status 11/10/2021 FINAL  Final  Urine Culture     Status: None   Collection Time: 11/07/21  3:51 PM   Specimen: Urine, Clean Catch  Result Value Ref Range Status   Specimen Description   Final    URINE, CLEAN CATCH Performed at [facility], [address] [place], [place] [address]    Special Requests   Final    NONE Performed at [facility], [address] [place], [place] [address]    Culture   Final    NO GROWTH Performed at [facility], [place] [address], [place], [place] [address]    Report Status 11/09/2021 FINAL  Final    Coagulation Studies: No results for input(s): LABPROT, INR in the last 72 hours.  Urinalysis: No results for input(s): COLORURINE, LABSPEC, PHURINE, GLUCOSEU, HGBUR, BILIRUBINUR, KETONESUR, PROTEINUR, UROBILINOGEN, NITRITE, LEUKOCYTESUR in the last 72 hours.  Invalid input(s): APPERANCEUR     Imaging: DG Chest Port 1 View  Result Date: 11/12/2021 CLINICAL DATA:  Acute respiratory failure with hypoxia. EXAM: PORTABLE CHEST 1 VIEW COMPARISON:  November 09, 2021 FINDINGS: Right upper extremity PICC with tip near the superior cavoatrial junction. The heart size and mediastinal contours are within normal limits. Stable linear scarring in the right upper lobe. Slightly increased small right pleural effusion with adjacent airspace opacities. Increased conspicuity of the irregular nodular areas of consolidation in the right mid lung. The visualized skeletal structures are unremarkable. IMPRESSION: 1. Slightly increased small right pleural effusion with adjacent airspace opacities. 2. Increased conspicuity of the irregular nodular areas of consolidation in the right mid lung. Electronically Signed   By: Dahlia Bailiff M.D.   On: 11/12/2021 08:55     Medications:    ampicillin-sulbactam (UNASYN) IV 3 g  (11/12/21 1210)    apixaban  2.5 mg Oral BID   Chlorhexidine Gluconate Cloth  6 each Topical Daily   citalopram  40 mg Oral BH-q7a   diltiazem  240 mg Oral Daily   famotidine  20 mg Oral Daily   feeding supplement  237 mL Oral TID BM   fluticasone furoate-vilanterol  1 puff Inhalation  Daily   And   umeclidinium bromide  1 puff Inhalation Daily   furosemide  40 mg Intravenous BID   gabapentin  300 mg Oral BID   pantoprazole  40 mg Oral QHS   sodium chloride flush  10-40 mL Intracatheter Q12H   Vitamin D (Ergocalciferol)  50,000 Units Oral Q7 days   albuterol, HYDROmorphone (DILAUDID) injection, lip balm, menthol-cetylpyridinium, metoprolol tartrate, ondansetron **OR** ondansetron (ZOFRAN) IV, oxyCODONE-acetaminophen, sodium chloride, sodium chloride flush  Assessment/ Plan:  66 y.o. 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  Coronary artery disease, atherosclerosis, aortic stenosis, chronic atrial fibrillation   Principal Problem:   Ileostomy status ([place]) Active Problems:   Hyponatremia   Acute respiratory failure with hypoxia (HCC)   Weakness   Acute kidney injury superimposed on CKD (HCC)   HTN (hypertension)   AF (paroxysmal atrial fibrillation) (HCC)   Normocytic anemia   Depression   Acute on chronic diastolic CHF (congestive heart failure) (HCC)   Hyperphosphatemia   Hypomagnesemia   Anemia of chronic disease   Ileus following gastrointestinal surgery (HCC)   Drop in hemoglobin   Fever   Aspiration pneumonia of right lower lobe due to gastric secretions ([place])   #. 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 Baseline Creatnine 1.25 / GFR  on 10/30/2021 Recent Labs    11/09/21 0430 11/10/21 1024 11/11/21 1159 11/12/21 1000  CREATININE 2.21* 2.71* 2.51* 2.25*  AKI  likely multifactorial ATN versus AIN   Data in favor of AIN Urinalysis from 1/22 /23 shows large leukocytes, no WBCs, suggesting ? interstitial nephritis.   Patient urine culture so far has been negative Patient creatinine has been rising after initiation of Unasyn Unasyn was initiated on November 06, 2021 patient creatinine was 1.9 on 25 January, 2.2 on 26 January and now 2.7 on 27 January Creatinine improved-Unasyn has now been DC'd  Data in favor of ATN Blood pressure has also been low .  Hypotension can contribute to worsening renal function. -Creatinine elevated with initiation of diuretic. Patient's diuretics were increased from furosemide 40mg  IV to daily since yesterday to furosemide 40mg  IV to twice daily . Creatinine improved after we lowered the diuretics   Patient AKI is improving No need for renal biopsy at this time With  #. Anemia of CKD  Lab Results  Component Value Date   HGB 7.8 (L) 11/10/2021   Patient has received blood transfusions this admission. We will continue to monitor during this admission.  #. SHPTH     Component Value Date/Time   PTH 152 (H) 10/30/2019 0435   Lab Results  Component Value Date   PHOS 4.0 11/11/2021  Will continue to monitor bone minerals Recommend ergocalciferol weekly  # S/p elective ileostomy reversal Hospital stay complicated by ileus TPN stopped due to toleration of oral intake We will continue to monitor fluid volume  #Hypokalemia Secondary to IV diuresis  potassium is now better  #Pleural effusion Patient to have thoracentesis tomorrow   LOS: [place] [facility] 1/29/20231:53 PM  [facility] [place], [place]

## 2021-11-12 NOTE — Progress Notes (Signed)
Patient ID: Danielle [name], female   DOB: [birth year], 66 y.o.   MRN: [id number]     [place] [facility] Day(s): 20.   Interval History: Patient seen and examined, no acute events or new complaints overnight. Patient reports continued feeling fatigued and short of breath with minimal exertion.  Denies abdominal pain nausea or vomiting.  Endorses passing gas and having bowel movement.  Vital signs in last 24 hours: [min-max] current  Temp:  [98.1 F (36.7 C)-98.7 F (37.1 C)] 98.1 F (36.7 C) (01/29 0812) Pulse Rate:  [61-101] 61 (01/29 0812) Resp:  [16-20] 16 (01/29 0812) BP: (91-112)/(63-80) 97/74 (01/29 0812) SpO2:  [90 %-96 %] 96 % (01/29 0812)     Height: 5\' 6"  (167.6 cm) Weight: 64 kg BMI (Calculated): 22.78   Physical Exam:  Constitutional: alert, cooperative and no distress  Respiratory: breathing with mild shortness of breath Cardiovascular: Irregular rhythm Gastrointestinal: soft, non-tender, and non-distended.  The wounds are dry and clean  Labs:  CBC Latest Ref Rng & Units 11/10/2021 11/09/2021 11/08/2021  WBC 4.0 - 10.5 K/uL 8.4 8.4 -  Hemoglobin 12.0 - 15.0 g/dL 7.8(L) 7.7(L) 7.7(L)  Hematocrit 36.0 - 46.0 % 24.8(L) 24.5(L) -  Platelets 150 - 400 K/uL 294 276 -   CMP Latest Ref Rng & Units 11/11/2021 11/10/2021 11/09/2021  Glucose 70 - 99 mg/dL 113(H) 134(H) 108(H)  BUN 8 - 23 mg/dL 62(H) 75(H) 69(H)  Creatinine 0.44 - 1.00 mg/dL 2.51(H) 2.71(H) 2.21(H)  Sodium 135 - 145 mmol/L 136 135 134(L)  Potassium 3.5 - 5.1 mmol/L 3.3(L) 3.8 4.4  Chloride 98 - 111 mmol/L 104 102 103  CO2 22 - 32 mmol/L 23 22 24   Calcium 8.9 - 10.3 mg/dL 8.1(L) 8.6(L) 8.4(L)  Total Protein 6.5 - 8.1 g/dL - - 6.8  Total Bilirubin 0.3 - 1.2 mg/dL - - 0.7  Alkaline Phos 38 - 126 U/L - - 60  AST 15 - 41 U/L - - 21  ALT 0 - 44 U/L - - 18    Imaging studies: Chest x-ray shows persistent right-sided pleural effusion changes.   Assessment/Plan:  66 y.o. female with history of an  ileostomy and mucous fistula 20 Day Post-Op s/p ileostomy reversal, complicated by pertinent comorbidities including COPD, atrial fibrillation on anticoagulation, hypertension, chronic kidney disease stage III.   Status post ileostomy reversal -Patient initially with prolonged postoperative ileus that resolved. -TPN discontinued -Tolerating soft diet well -Passing gas and having bowel movements -We will discontinue Reglan     Chronic kidney disease stage III -Acute kidney injury after surgery.   -Creatinine yesterday shows mild decrease.  We will repeat labs today for follow-up trend. -Nephrology managing with diuretics.   Shortness of breath -Could be multifactorial due to consideration on right lung on last chest x-ray.  Also with small pleural effusion.  To the x-ray shows increasing size of the pleural effusion -Currently on Unasyn for treatment right lung pneumonia -Currently on diuretics for treatment of pleural effusions -Requiring nasal cannula at 3L/min which is decreased compared to yesterday that was at 4 L/min -Continues to need oxygen at home as needed but baseline was 2 L/min -Appreciate nephrology and cardiology for management of current medical comorbidities   A. Fib -Intermittent rate control -Currently on therapeutic anticoagulation -Appreciate hospitalist and cardiologist evaluation and management   Encourage patient to get out of bed.  Will continue pain management.   Arnold Long, MD

## 2021-11-13 ENCOUNTER — Inpatient Hospital Stay: Payer: Medicare Other

## 2021-11-13 LAB — CBC
HCT: 23.1 % — ABNORMAL LOW (ref 36.0–46.0)
Hemoglobin: 7.5 g/dL — ABNORMAL LOW (ref 12.0–15.0)
MCH: 30 pg (ref 26.0–34.0)
MCHC: 32.5 g/dL (ref 30.0–36.0)
MCV: 92.4 fL (ref 80.0–100.0)
Platelets: 330 10*3/uL (ref 150–400)
RBC: 2.5 MIL/uL — ABNORMAL LOW (ref 3.87–5.11)
RDW: 15.9 % — ABNORMAL HIGH (ref 11.5–15.5)
WBC: 7.4 10*3/uL (ref 4.0–10.5)
nRBC: 0.3 % — ABNORMAL HIGH (ref 0.0–0.2)

## 2021-11-13 LAB — BODY FLUID CELL COUNT WITH DIFFERENTIAL
Eos, Fluid: 0 %
Lymphs, Fluid: 60 %
Monocyte-Macrophage-Serous Fluid: 14 %
Neutrophil Count, Fluid: 26 %
Total Nucleated Cell Count, Fluid: 626 cu mm

## 2021-11-13 MED ORDER — ADULT MULTIVITAMIN W/MINERALS CH
1.0000 | ORAL_TABLET | Freq: Every day | ORAL | Status: DC
  Administered 2021-11-13 – 2021-11-23 (×11): 1 via ORAL
  Filled 2021-11-13 (×11): qty 1

## 2021-11-13 MED ORDER — BOOST / RESOURCE BREEZE PO LIQD CUSTOM
1.0000 | Freq: Three times a day (TID) | ORAL | Status: DC
  Administered 2021-11-13 – 2021-11-20 (×7): 1 via ORAL

## 2021-11-13 NOTE — Progress Notes (Signed)
Physical Therapy Treatment Patient Details Name: Danielle [name] MRN: [id number] DOB: October 09, 1956 Today's Date: 11/13/2021   History of Present Illness 66 y/o female s/p iliostomy take down 1/09.    PT Comments    Pt is making gradual progress towards goals, continues to be limited by O2 sats and HR. All mobility performed on 4L of O2 with sats decreasing to 78% with hallway mobility, improving to 85% with seated rest break. At 88% after 5 minutes. HR ranging from 92-140s with exertion. Able to toilet with supervision and able to perform supine there-ex. Will continue to progress as able. Encouraged continued use of RW.    Recommendations for follow up therapy are one component of a multi-disciplinary discharge planning process, led by the attending physician.  Recommendations may be updated based on patient status, additional functional criteria and insurance authorization.  Follow Up Recommendations  Home health PT     Assistance Recommended at Discharge Intermittent Supervision/Assistance  Patient can return home with the following A lot of help with walking and/or transfers;A lot of help with bathing/dressing/bathroom   Equipment Recommendations  Rolling walker (2 wheels)    Recommendations for Other Services       Precautions / Restrictions Precautions Precautions: Fall Restrictions Weight Bearing Restrictions: No     Mobility  Bed Mobility Overal bed mobility: Needs Assistance Bed Mobility: Supine to Sit, Sit to Supine     Supine to sit: Modified independent (Device/Increase time) Sit to supine: Modified independent (Device/Increase time)   General bed mobility comments: safe technique    Transfers Overall transfer level: Modified independent   Transfers: Sit to/from Stand Sit to Stand: Modified independent (Device/Increase time)           General transfer comment: safe technique    Ambulation/Gait Ambulation/Gait assistance: Min guard Gait Distance  (Feet): 75 Feet Assistive device: Rolling walker (2 wheels) Gait Pattern/deviations: Step-through pattern       General Gait Details: ambulated in hallway with RW. O2 sats monitored with sats decreasing to 78% with exertion and HR increasing to 140s. All mobility performed on 4L of O2.   Stairs             Wheelchair Mobility    Modified Rankin (Stroke Patients Only)       Balance Overall balance assessment: Needs assistance Sitting-balance support: No upper extremity supported Sitting balance-Leahy Scale: Good     Standing balance support: Bilateral upper extremity supported Standing balance-Leahy Scale: Fair                              Cognition Arousal/Alertness: Awake/alert Behavior During Therapy: WFL for tasks assessed/performed Overall Cognitive Status: Within Functional Limits for tasks assessed                                          Exercises Other Exercises Other Exercises: ambulated to [facility] with mod I. Needs assist for set up Other Exercises: supine ther-ex performed on B LE including heel slides, SAQ, hip add squeezes. 10 reps with supervision    General Comments        Pertinent Vitals/Pain Pain Assessment Pain Assessment: No/denies pain    Home Living                          Prior Function  PT Goals (current goals can now be found in the care plan section) Acute Rehab PT Goals Patient Stated Goal: go home PT Goal Formulation: With patient Time For Goal Achievement: 11/20/21 Potential to Achieve Goals: Fair Progress towards PT goals: Progressing toward goals    Frequency    Min 2X/week      PT Plan Current plan remains appropriate    Co-evaluation              AM-PAC PT "6 Clicks" Mobility   Outcome Measure  Help needed turning from your back to your side while in a flat bed without using bedrails?: None Help needed moving from lying on your back to sitting on  the side of a flat bed without using bedrails?: None Help needed moving to and from a bed to a chair (including a wheelchair)?: A Little Help needed standing up from a chair using your arms (e.g., wheelchair or bedside chair)?: A Little Help needed to walk in hospital room?: A Little Help needed climbing 3-5 steps with a railing? : A Lot 6 Click Score: 19    End of Session Equipment Utilized During Treatment: Gait belt;Oxygen Activity Tolerance: Patient tolerated treatment well Patient left: in bed;with bed alarm set Nurse Communication: Mobility status PT Visit Diagnosis: Muscle weakness (generalized) (M62.81);Difficulty in walking, not elsewhere classified (R26.2)     Time: 4650-3546 PT Time Calculation (min) (ACUTE ONLY): 32 min  Charges:  $Gait Training: 8-22 mins $Therapeutic Exercise: 8-22 mins                     Danielle [name], PT, DPT, GCS [phone]    Danielle [name] 11/13/2021, 3:46 PM

## 2021-11-13 NOTE — Progress Notes (Signed)
[facility], [place] 11/13/21  Subjective:   LOS: 21 01/29 0701 - 01/30 0700 In: 100 [IV Piggyback:100] Out: 900 [Urine:900]  Patient resting comfortably in bed today.  Patient had right thoracentesis performed. No acute complaints at the moment.  Objective:  Vital signs in last 24 hours:  Temp:  [98 F (36.7 C)-98.7 F (37.1 C)] 98 F (36.7 C) (01/30 1539) Pulse Rate:  [76-123] 90 (01/30 1539) Resp:  [16-20] 18 (01/30 1539) BP: (99-119)/(65-94) 108/66 (01/30 1539) SpO2:  [90 %-98 %] 98 % (01/30 1539)  Weight change:  Filed Weights   11/09/21 0500 11/10/21 0600 11/11/21 0400  Weight: 66.4 kg 65.1 kg 64 kg    Intake/Output:    Intake/Output Summary (Last 24 hours) at 11/13/2021 1541 Last data filed at 11/13/2021 1043 Gross per 24 hour  Intake 240 ml  Output 900 ml  Net -660 ml      Physical Exam: General: NAD elderly female, laying in the bed  HEENT Moist oral mucosa  Pulm/lungs Diminished at bases, normal effort  CVS/Heart No rub or gallop  Abdomen:  Soft, bowel sounds present in right lower quadrant  Extremities: 1+ peripheral edema  Neurologic: Alert, able to answer questions appropriately  Skin: Warm, dry          Basic Metabolic Panel:  Recent Labs  Lab 11/08/21 0337 11/09/21 0430 11/10/21 1024 11/11/21 1159 11/12/21 1000  NA 136 134* 135 136 135  K 4.0 4.4 3.8 3.3* 3.7  CL 104 103 102 104 104  CO2 23 24 22 23 23   GLUCOSE 109* 108* 134* 113* 122*  BUN 60* 69* 75* 62* 55*  CREATININE 1.92* 2.21* 2.71* 2.51* 2.25*  CALCIUM 8.7* 8.4* 8.6* 8.1* 8.3*  MG  --  2.1  --   --   --   PHOS 4.4 5.0* 5.0* 4.0  --       CBC: Recent Labs  Lab 11/07/21 1633 11/08/21 0339 11/09/21 0911 11/10/21 1024 11/13/21 0445  WBC  --   --  8.4 8.4 7.4  HGB 7.9* 7.7* 7.7* 7.8* 7.5*  HCT 25.3*  --  24.5* 24.8* 23.1*  MCV  --   --  98.4 97.6 92.4  PLT  --   --  276 294 330      No results found for: HEPBSAG, HEPBSAB,  HEPBIGM    Microbiology:  Recent Results (from the past 240 hour(s))  MRSA Next Gen by PCR, Nasal     Status: None   Collection Time: 11/05/21  5:56 AM   Specimen: Nasal Mucosa; Nasal Swab  Result Value Ref Range Status   MRSA by PCR Next Gen NOT DETECTED NOT DETECTED Final    Comment: (NOTE) The GeneXpert MRSA Assay (FDA approved for NASAL specimens only), is one component of a comprehensive MRSA colonization surveillance program. It is not intended to diagnose MRSA infection nor to guide or monitor treatment for MRSA infections. Test performance is not FDA approved in patients less than 14 years old. Performed at [facility], [address] [place], [place] [address]   CULTURE, BLOOD (ROUTINE X 2) w Reflex to ID Panel     Status: None   Collection Time: 11/05/21  8:09 AM   Specimen: BLOOD  Result Value Ref Range Status   Specimen Description BLOOD BLOOD RIGHT WRIST  Final   Special Requests   Final    BOTTLES DRAWN AEROBIC ONLY Blood Culture adequate volume   Culture   Final  NO GROWTH 5 DAYS Performed at [facility], [address] [place], [place] [address]    Report Status 11/10/2021 FINAL  Final  CULTURE, BLOOD (ROUTINE X 2) w Reflex to ID Panel     Status: None   Collection Time: 11/05/21  8:09 AM   Specimen: BLOOD  Result Value Ref Range Status   Specimen Description BLOOD BLOOD RIGHT HAND  Final   Special Requests   Final    BOTTLES DRAWN AEROBIC AND ANAEROBIC Blood Culture adequate volume   Culture   Final    NO GROWTH 5 DAYS Performed at [facility], [address] [place], [place] [address]    Report Status 11/10/2021 FINAL  Final  Urine Culture     Status: None   Collection Time: 11/07/21  3:51 PM   Specimen: Urine, Clean Catch  Result Value Ref Range Status   Specimen Description   Final    URINE, CLEAN CATCH Performed at [facility], [address], [place], [place] [address]    Special Requests    Final    NONE Performed at [facility], [address], [place], [place] [address]    Culture   Final    NO GROWTH Performed at [facility], [place] [address] [place], [place] [address]    Report Status 11/09/2021 FINAL  Final    Coagulation Studies: No results for input(s): LABPROT, INR in the last 72 hours.  Urinalysis: No results for input(s): COLORURINE, LABSPEC, PHURINE, GLUCOSEU, HGBUR, BILIRUBINUR, KETONESUR, PROTEINUR, UROBILINOGEN, NITRITE, LEUKOCYTESUR in the last 72 hours.  Invalid input(s): APPERANCEUR     Imaging: DG Chest Port 1 View  Result Date: 11/13/2021 CLINICAL DATA:  Post right thoracentesis EXAM: PORTABLE CHEST 1 VIEW COMPARISON:  Chest x-ray dated November 12, 2021 FINDINGS: Cardiac and mediastinal contours are unchanged. Linear nodular opacity of the right upper lobe, unchanged from prior exam. No new parenchymal opacity. Interval decreased size of small right pleural effusion. No evidence pneumothorax. IMPRESSION: No evidence of pneumothorax. Electronically Signed   By: Yetta Glassman M.D.   On: 11/13/2021 11:46   DG Chest Port 1 View  Result Date: 11/12/2021 CLINICAL DATA:  Acute respiratory failure with hypoxia. EXAM: PORTABLE CHEST 1 VIEW COMPARISON:  November 09, 2021 FINDINGS: Right upper extremity PICC with tip near the superior cavoatrial junction. The heart size and mediastinal contours are within normal limits. Stable linear scarring in the right upper lobe. Slightly increased small right pleural effusion with adjacent airspace opacities. Increased conspicuity of the irregular nodular areas of consolidation in the right mid lung. The visualized skeletal structures are unremarkable. IMPRESSION: 1. Slightly increased small right pleural effusion with adjacent airspace opacities. 2. Increased conspicuity of the irregular nodular areas of consolidation in the right mid lung. Electronically Signed   By: Dahlia Bailiff M.D.   On: 11/12/2021  08:55   US THORACENTESIS ASP PLEURAL SPACE W/IMG GUIDE  Result Date: 11/13/2021 INDICATION: Right pleural effusion with shortness breath request received for diagnostic and therapeutic thoracentesis. EXAM: ULTRASOUND GUIDED RIGHT THORACENTESIS MEDICATIONS: Local 1% lidocaine only. COMPLICATIONS: None immediate. PROCEDURE: An ultrasound guided thoracentesis was thoroughly discussed with the patient and questions answered. The benefits, risks, alternatives and complications were also discussed. The patient understands and wishes to proceed with the procedure. Written consent was obtained. Ultrasound was performed to localize and mark an adequate pocket of fluid in the right chest. The area was then prepped and draped in the normal sterile fashion.  1% Lidocaine was used for local anesthesia. Under ultrasound guidance a 19 gauge, 7-cm, Yueh catheter was introduced. Thoracentesis was performed. The catheter was removed and a dressing applied. FINDINGS: A total of approximately 1 L of amber colored fluid was removed. Samples were sent to the laboratory as requested by the clinical team. IMPRESSION: Successful ultrasound guided right thoracentesis yielding 1 L of pleural fluid. This exam was performed by Tsosie Billing PA-C, and was supervised and interpreted by Dr. Denna Haggard. Electronically Signed   By: Albin Felling M.D.   On: 11/13/2021 12:05     Medications:    ampicillin-sulbactam (UNASYN) IV 3 g (11/13/21 1311)    Chlorhexidine Gluconate Cloth  6 each Topical Daily   citalopram  40 mg Oral BH-q7a   diltiazem  240 mg Oral Daily   famotidine  20 mg Oral Daily   feeding supplement  1 Container Oral TID BM   fluticasone furoate-vilanterol  1 puff Inhalation Daily   And   umeclidinium bromide  1 puff Inhalation Daily   furosemide  40 mg Intravenous BID   gabapentin  300 mg Oral BID   multivitamin with minerals  1 tablet Oral Daily   pantoprazole  40 mg Oral QHS   sodium chloride flush  10-40 mL  Intracatheter Q12H   Vitamin D (Ergocalciferol)  50,000 Units Oral Q7 days   albuterol, HYDROmorphone (DILAUDID) injection, lip balm, menthol-cetylpyridinium, metoprolol tartrate, ondansetron **OR** ondansetron (ZOFRAN) IV, oxyCODONE-acetaminophen, sodium chloride, sodium chloride flush  Assessment/ Plan:  66 y.o. 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  Coronary artery disease, atherosclerosis, aortic stenosis, chronic atrial fibrillation   Principal Problem:   Ileostomy status ([employer]) Active Problems:   Hyponatremia   Acute respiratory failure with hypoxia (HCC)   Weakness   Acute kidney injury superimposed on CKD (HCC)   HTN (hypertension)   AF (paroxysmal atrial fibrillation) (HCC)   Normocytic anemia   Depression   Acute on chronic diastolic CHF (congestive heart failure) (HCC)   Hyperphosphatemia   Hypomagnesemia   Anemia of chronic disease   Ileus following gastrointestinal surgery (HCC)   Drop in hemoglobin   Fever   Aspiration pneumonia of right lower lobe due to gastric secretions ([place])   #. 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 Baseline Creatnine 1.25 / GFR  on 10/30/2021 Recent Labs    11/09/21 0430 11/10/21 1024 11/11/21 1159 11/12/21 1000  CREATININE 2.21* 2.71* 2.51* 2.25*   AKI likely multifactorial Urinalysis from 1/22 shows large leukocytes, no WBCs, suggesting ? interstitial nephritis.  We will still await urine culture. Blood pressure has also been low normal with most recent blood pressure 100/48.  Hypotension can contribute to worsening renal function. Currently getting Unasyn for possible right lung Aspiration pneumonia cxr 11/05/21 -Renal function improved slightly as creatinine down to 2.25.  On Lasix 40 mg IV daily.  Continue to monitor renal  parameters closely.  May need to de-escalate furosemide if renal function worsens.  #. Anemia of CKD  Lab Results  Component Value Date   HGB 7.5 (L) 11/13/2021   Patient has received blood transfusions this admission. We will continue to monitor during this admission.  Consider Epogen as an outpatient.  Phosphorus at target at 4.0.  #. SHPTH     Component Value Date/Time   PTH 152 (H) 10/30/2019 0435   Lab  Results  Component Value Date   PHOS 4.0 11/11/2021  Will continue to monitor bone minerals Recommend ergocalciferol weekly  # S/p elective ileostomy reversal Hospital stay complicated by ileus TPN stopped due to toleration of oral intake We will continue to monitor fluid volume   LOS: 21 Madgeline Rayo 1/30/20233:41 PM  [place], [place]

## 2021-11-13 NOTE — Care Management Important Message (Signed)
Important Message  Patient Details  Name: Danielle [name] MRN: [id number] Date of Birth: 1956-06-03   Medicare Important Message Given:  Yes     [name] 11/13/2021, 11:35 AM

## 2021-11-13 NOTE — Progress Notes (Signed)
Patient ID: Danielle [name], female   DOB: October 28, 1955, 66 y.o.   MRN: [id number]     Page Hospital Day(s): 21.   Interval History: Patient seen and examined, no acute events or new complaints overnight. Patient reports feeling well.  She still having treatment of breast on exertion.  She was able to ambulate to the nurses station yesterday.  Patient had thoracentesis today.  Denies chest pain.  Endorses tolerating diet and having bowel movement.  Vital signs in last 24 hours: [min-max] current  Temp:  [98 F (36.7 C)-98.7 F (37.1 C)] 98 F (36.7 C) (01/30 1539) Pulse Rate:  [81-123] 90 (01/30 1539) Resp:  [16-20] 18 (01/30 1539) BP: (99-119)/(65-94) 108/66 (01/30 1539) SpO2:  [90 %-98 %] 98 % (01/30 1539)     Height: 5\' 6"  (167.6 cm) Weight: 64 kg BMI (Calculated): 22.78   Physical Exam:  Constitutional: alert, cooperative and no distress  Respiratory: breathing non-labored at rest  Cardiovascular: Irregular rhythm Gastrointestinal: soft, non-tender, and non-distended  Labs:  CBC Latest Ref Rng & Units 11/13/2021 11/10/2021 11/09/2021  WBC 4.0 - 10.5 K/uL 7.4 8.4 8.4  Hemoglobin 12.0 - 15.0 g/dL 7.5(L) 7.8(L) 7.7(L)  Hematocrit 36.0 - 46.0 % 23.1(L) 24.8(L) 24.5(L)  Platelets 150 - 400 K/uL 330 294 276   CMP Latest Ref Rng & Units 11/12/2021 11/11/2021 11/10/2021  Glucose 70 - 99 mg/dL 122(H) 113(H) 134(H)  BUN 8 - 23 mg/dL 55(H) 62(H) 75(H)  Creatinine 0.44 - 1.00 mg/dL 2.25(H) 2.51(H) 2.71(H)  Sodium 135 - 145 mmol/L 135 136 135  Potassium 3.5 - 5.1 mmol/L 3.7 3.3(L) 3.8  Chloride 98 - 111 mmol/L 104 104 102  CO2 22 - 32 mmol/L 23 23 22   Calcium 8.9 - 10.3 mg/dL 8.3(L) 8.1(L) 8.6(L)  Total Protein 6.5 - 8.1 g/dL - - -  Total Bilirubin 0.3 - 1.2 mg/dL - - -  Alkaline Phos 38 - 126 U/L - - -  AST 15 - 41 U/L - - -  ALT 0 - 44 U/L - - -    Imaging studies: No new pertinent imaging studies   Assessment/Plan:  66 y.o. female with history of an ileostomy and  mucous fistula 21 Day Post-Op s/p ileostomy reversal, complicated by pertinent comorbidities including COPD, atrial fibrillation on anticoagulation, hypertension, chronic kidney disease stage III.   Status post ileostomy reversal -Patient initially with prolonged postoperative ileus that resolved. -Tolerating soft diet well -Passing gas and having bowel movements     Chronic kidney disease stage III -Acute kidney injury after surgery.   --Nephrology managing with diuretics.   Shortness of breath -Could be multifactorial due to consideration on right lung consolidation on last chest x-ray.  Also with small pleural effusion.   -Still on Unasyn for treatment right lung pneumonia -Currently on diuretics for treatment of pleural effusions -Had thoracentesis today for treatment of pleural effusion as well -Requiring nasal cannula at 3L/min  -Continues to need oxygen at home as needed but baseline was 2 L/min -Consulted pulmonology today for further recommendations due to difficulty weaning oxygen.  Assessment by pulmonology most likely due to pleural effusion due to chronic kidney disease.  We will continue to follow.  He order treatment for atelectasis. -Appreciate nephrology and cardiology for management of current medical comorbidities   A. Fib -Intermittent rate control -Currently on therapeutic anticoagulation -Appreciate hospitalist and cardiologist evaluation and management   Encourage patient to get out of bed.  Will continue pain management.  Arnold Long, MD

## 2021-11-13 NOTE — Progress Notes (Signed)
PROGRESS NOTE    Danielle [name]  SHF:[id number] DOB: 05/08/1956 DOA: 10/23/2021 PCP: [facility]    Assessment & Plan:   Principal Problem:   Ileostomy status ([place]) Active Problems:   Hyponatremia   Acute respiratory failure with hypoxia ([place])   Weakness   Acute kidney injury superimposed on CKD (HCC)   HTN (hypertension)   AF (paroxysmal atrial fibrillation) (HCC)   Normocytic anemia   Depression   Acute on chronic diastolic CHF (congestive heart failure) (HCC)   Hyperphosphatemia   Hypomagnesemia   Anemia of chronic disease   Ileus following gastrointestinal surgery (HCC)   Drop in hemoglobin   Fever   Aspiration pneumonia of right lower lobe due to gastric secretions (HCC)   Aspiration pneumonia: continue on IV unasyn, bronchodilators & encourage incentive spirometry. Pulmon consulted   ACD: H&H are labile. S/p 2 units of pRBCs transfused this admission. Will continue to monitor   PAF: continue on cardizem, metoprolol. Can restart eliquis tomorrow   Postoperative ileus: s/p ileostomy reversal. NG tube has been d/c. TPN has been d/c. Continue on soft diet as per general surg   Acute hypoxic respiratory failure: pulse ox did drop down into the 80s on 3 L. Continue on supplemental oxygen and wean as tolerated.   Right pleural effusion: s/p 1L of amber colored removed w/ thoracentesis. Cytology is pending. Pleural cx is pending. Pulmon consulted     AKI on CKDIIIb: Cr is labile. Avoid nephrotoxic meds   Weakness: PT recs HH    DVT prophylaxis: eliquis  Code Status: full  Family Communication:  Disposition Plan: as per general surg recs   Level of care: Telemetry Medical  Status is: Inpatient  Remains inpatient appropriate because: as per general surg   Consultants:  Hospitalist  General surg (primary)  Procedures:   s/p ileostomy reversal.  Antimicrobials: unasyn    Subjective: Pt still c/o shortness of breath    Objective: Vitals:   11/12/21 1544 11/12/21 2007 11/13/21 0507 11/13/21 0747  BP: 103/74 99/65 119/86 107/75  Pulse: 76 81 95 88  Resp: 16 16 20 18   Temp: 98.5 F (36.9 C) 98.7 F (37.1 C) 98.3 F (36.8 C) 98 F (36.7 C)  TempSrc: Oral Axillary Oral Oral  SpO2: 98% 94% 96% 94%  Weight:      Height:        Intake/Output Summary (Last 24 hours) at 11/13/2021 0753 Last data filed at 11/13/2021 0525 Gross per 24 hour  Intake 100 ml  Output 900 ml  Net -800 ml   Filed Weights   11/09/21 0500 11/10/21 0600 11/11/21 0400  Weight: 66.4 kg 65.1 kg 64 kg    Examination:  General exam: Appears uncomfortable   Respiratory system: decreased breath sounds b/l  Cardiovascular system: S1/S2+. No rubs or gallops  Gastrointestinal system: Abd is soft, NT, ND & normal bowel sounds  Central nervous system: alert and oriented. Moves all extremities   Psychiatry: judgement and insight appears normal. Flat mood and affect     Data Reviewed: I have personally reviewed following labs and imaging studies  CBC: Recent Labs  Lab 11/07/21 1633 11/08/21 0339 11/09/21 0911 11/10/21 1024 11/13/21 0445  WBC  --   --  8.4 8.4 7.4  HGB 7.9* 7.7* 7.7* 7.8* 7.5*  HCT 25.3*  --  24.5* 24.8* 23.1*  MCV  --   --  98.4 97.6 92.4  PLT  --   --  276 294 330  Basic Metabolic Panel: Recent Labs  Lab 11/08/21 0337 11/09/21 0430 11/10/21 1024 11/11/21 1159 11/12/21 1000  NA 136 134* 135 136 135  K 4.0 4.4 3.8 3.3* 3.7  CL 104 103 102 104 104  CO2 23 24 22 23 23   GLUCOSE 109* 108* 134* 113* 122*  BUN 60* 69* 75* 62* 55*  CREATININE 1.92* 2.21* 2.71* 2.51* 2.25*  CALCIUM 8.7* 8.4* 8.6* 8.1* 8.3*  MG  --  2.1  --   --   --   PHOS 4.4 5.0* 5.0* 4.0  --    GFR: Estimated Creatinine Clearance: 22.7 mL/min (A) (by C-G formula based on SCr of 2.25 mg/dL (H)). Liver Function Tests: Recent Labs  Lab 11/08/21 0337 11/09/21 0430 11/10/21 1024 11/11/21 1159  AST  --  21  --   --   ALT  --   18  --   --   ALKPHOS  --  60  --   --   BILITOT  --  0.7  --   --   PROT  --  6.8  --   --   ALBUMIN 2.6* 2.7* 2.8* 2.6*   No results for input(s): LIPASE, AMYLASE in the last 168 hours.  No results for input(s): AMMONIA in the last 168 hours. Coagulation Profile: No results for input(s): INR, PROTIME in the last 168 hours. Cardiac Enzymes: No results for input(s): CKTOTAL, CKMB, CKMBINDEX, TROPONINI in the last 168 hours. BNP (last 3 results) No results for input(s): PROBNP in the last 8760 hours. HbA1C: No results for input(s): HGBA1C in the last 72 hours. CBG: Recent Labs  Lab 11/08/21 1140 11/08/21 1800 11/08/21 2348 11/09/21 0609 11/09/21 1117  GLUCAP 127* 153* 121* 135* 127*   Lipid Profile: No results for input(s): CHOL, HDL, LDLCALC, TRIG, CHOLHDL, LDLDIRECT in the last 72 hours.  Thyroid Function Tests: No results for input(s): TSH, T4TOTAL, FREET4, T3FREE, THYROIDAB in the last 72 hours. Anemia Panel: No results for input(s): VITAMINB12, FOLATE, FERRITIN, TIBC, IRON, RETICCTPCT in the last 72 hours. Sepsis Labs: No results for input(s): PROCALCITON, LATICACIDVEN in the last 168 hours.   Recent Results (from the past 240 hour(s))  MRSA Next Gen by PCR, Nasal     Status: None   Collection Time: 11/05/21  5:56 AM   Specimen: Nasal Mucosa; Nasal Swab  Result Value Ref Range Status   MRSA by PCR Next Gen NOT DETECTED NOT DETECTED Final    Comment: (NOTE) The GeneXpert MRSA Assay (FDA approved for NASAL specimens only), is one component of a comprehensive MRSA colonization surveillance program. It is not intended to diagnose MRSA infection nor to guide or monitor treatment for MRSA infections. Test performance is not FDA approved in patients less than 35 years old. Performed at [facility], [address] [place], [place] [address]   CULTURE, BLOOD (ROUTINE X 2) w Reflex to ID Panel     Status: None   Collection Time: 11/05/21  8:09 AM    Specimen: BLOOD  Result Value Ref Range Status   Specimen Description BLOOD BLOOD RIGHT WRIST  Final   Special Requests   Final    BOTTLES DRAWN AEROBIC ONLY Blood Culture adequate volume   Culture   Final    NO GROWTH 5 DAYS Performed at [facility], [address] [place], [place] [address]    Report Status 11/10/2021 FINAL  Final  CULTURE, BLOOD (ROUTINE X 2) w Reflex to ID Panel  Status: None   Collection Time: 11/05/21  8:09 AM   Specimen: BLOOD  Result Value Ref Range Status   Specimen Description BLOOD BLOOD RIGHT HAND  Final   Special Requests   Final    BOTTLES DRAWN AEROBIC AND ANAEROBIC Blood Culture adequate volume   Culture   Final    NO GROWTH 5 DAYS Performed at [facility], [address], [place], [place] [address]    Report Status 11/10/2021 FINAL  Final  Urine Culture     Status: None   Collection Time: 11/07/21  3:51 PM   Specimen: Urine, Clean Catch  Result Value Ref Range Status   Specimen Description   Final    URINE, CLEAN CATCH Performed at [facility], [address] [place], [place] [address]    Special Requests   Final    NONE Performed at [facility], [address] [place], [place] [address]    Culture   Final    NO GROWTH Performed at [facility], [place] [address] [place],  [address]    Report Status 11/09/2021 FINAL  Final         Radiology Studies: DG Chest Port 1 View  Result Date: 11/12/2021 CLINICAL DATA:  Acute respiratory failure with hypoxia. EXAM: PORTABLE CHEST 1 VIEW COMPARISON:  November 09, 2021 FINDINGS: Right upper extremity PICC with tip near the superior cavoatrial junction. The heart size and mediastinal contours are within normal limits. Stable linear scarring in the right upper lobe. Slightly increased small right pleural effusion with adjacent airspace opacities. Increased conspicuity of the irregular nodular areas of consolidation in the right mid lung.  The visualized skeletal structures are unremarkable. IMPRESSION: 1. Slightly increased small right pleural effusion with adjacent airspace opacities. 2. Increased conspicuity of the irregular nodular areas of consolidation in the right mid lung. Electronically Signed   By: Dahlia Bailiff M.D.   On: 11/12/2021 08:55        Scheduled Meds:  apixaban  2.5 mg Oral BID   Chlorhexidine Gluconate Cloth  6 each Topical Daily   citalopram  40 mg Oral BH-q7a   diltiazem  240 mg Oral Daily   famotidine  20 mg Oral Daily   feeding supplement  237 mL Oral TID BM   fluticasone furoate-vilanterol  1 puff Inhalation Daily   And   umeclidinium bromide  1 puff Inhalation Daily   furosemide  40 mg Intravenous BID   gabapentin  300 mg Oral BID   pantoprazole  40 mg Oral QHS   sodium chloride flush  10-40 mL Intracatheter Q12H   Vitamin D (Ergocalciferol)  50,000 Units Oral Q7 days   Continuous Infusions:  ampicillin-sulbactam (UNASYN) IV 3 g (11/13/21 0027)     LOS: 21 days    Time spent: 25 mins     Wyvonnia Dusky, MD [facility] Pager [phone]  If 7PM-7AM, please contact night-coverage 11/13/2021, 7:53 AM

## 2021-11-13 NOTE — Consult Note (Signed)
PULMONOLOGY         Date: 11/13/2021,   MRN# [id number] Danielle [name] May 19, 1956     AdmissionWeight: 56.2 kg                 CurrentWeight: 64 kg   Referring physician: Dr Peyton Najjar   CHIEF COMPLAINT:   Increased O2 requirement.   HISTORY OF PRESENT ILLNESS   This is a pleasant 66 yo with complex comorbid history including, COPD, AF, HTN, CKD3, s/p ileostomy reversal with persistent increase O2 req and notable pleural effusion. She is s/p thoracentesis yielding 1L fluid drained from right pleural space.  She is on supplemental O2 and feels improved.     PAST MEDICAL HISTORY   Past Medical History:  Diagnosis Date   Anemia    Anxiety    Aortic atherosclerosis ([place])    Aortic valve stenosis 02/10/2018   a.) TTE 02/10/2018: EF 55-60%: mild AS with MPG of 12 mmHg. b.) TTE 04/21/2018: EF 35-40%; mild AS with MPG 8 mmHg. c.) TTE 11/07/2019: EF 50-55%; mild AS with MPG 10 mmHg.   Arthritis    Atrial fibrillation (HCC)    a.) CHA2DS2-VASc = 4 (age, sex, HTN, aortic plaque). b.) Rate/rhythm maintained on oral carvedilol; chronically anticoagulated on dose reduced apixaban. c.)  Attempted deployment of LAA occlusive device on 01/11/2021; parameters failed and procedure aborted.   Breast cancer, left ([place]) 2000   a.) T2N1M0; ER/PR (+) --> Tx'd with total mastectomy, LN resection, XRT, and chemotherapy   Cancer of right lung ([place]) 07/30/2016   a.) adenocarcinoma; ALK, ROS1, PDL1, BRAF, EGFR all negative.   CKD (chronic kidney disease), stage IV (HCC)    COPD (chronic obstructive pulmonary disease) (HCC)    Dependence on supplemental oxygen    Depression    Diastolic dysfunction 26/20/3559   a.) TTE 02/10/2017: EF 55-60%; G2DD. b.) TTE 04/21/2018: EF 35-40%; mild LA dilation; mod MV regurgitation. c.) TTE 11/07/2019: EF 50-55%; G1DD.   DOE (dyspnea on exertion)    GERD (gastroesophageal reflux disease)    Heart murmur    History of 2019 novel coronavirus disease  (COVID-19) 10/14/2019   HLD (hyperlipidemia)    Hypertension    Long term current use of anticoagulant    a.) apixaban   Lymphedema    Personal history of chemotherapy    Personal history of radiation therapy    SBO (small bowel obstruction) ([place]) 11/05/2020   Vitamin D deficiency      SURGICAL HISTORY   Past Surgical History:  Procedure Laterality Date   Breast Biospy Left    [facility]   BREAST SURGERY     COLONOSCOPY N/A 04/30/2018   Procedure: COLONOSCOPY;  Surgeon: Virgel Manifold, MD;  Location: [facility] ENDOSCOPY;  Service: Endoscopy;  Laterality: N/A;   COLONOSCOPY N/A 07/22/2018   Procedure: COLONOSCOPY;  Surgeon: Virgel Manifold, MD;  Location: [facility] ENDOSCOPY;  Service: Endoscopy;  Laterality: N/A;   COLONOSCOPY WITH PROPOFOL N/A 09/21/2021   Procedure: COLONOSCOPY WITH PROPOFOL;  Surgeon: Benjamine Sprague, DO;  Location: [facility] ENDOSCOPY;  Service: General;  Laterality: N/A;   DILATION AND CURETTAGE OF UTERUS     ELECTROMAGNETIC NAVIGATION BROCHOSCOPY Right 04/11/2016   Procedure: ELECTROMAGNETIC NAVIGATION BRONCHOSCOPY;  Surgeon: Vilinda Boehringer, MD;  Location: [facility] ORS;  Service: Cardiopulmonary;  Laterality: Right;   ESOPHAGOGASTRODUODENOSCOPY N/A 07/22/2018   Procedure: ESOPHAGOGASTRODUODENOSCOPY (EGD);  Surgeon: Virgel Manifold, MD;  Location[facility] ENDOSCOPY;  Service: Endoscopy;  Laterality: N/A;   ESOPHAGOGASTRODUODENOSCOPY (EGD)  WITH PROPOFOL N/A 05/07/2018   Procedure: ESOPHAGOGASTRODUODENOSCOPY (EGD) WITH PROPOFOL;  Surgeon: Midge Minium, MD;  Location: [facility] ENDOSCOPY;  Service: Endoscopy;  Laterality: N/A;   ESOPHAGOGASTRODUODENOSCOPY (EGD) WITH PROPOFOL N/A 04/24/2019   Procedure: ESOPHAGOGASTRODUODENOSCOPY (EGD) WITH PROPOFOL;  Surgeon: Wyline Mood, MD;  Location: [facility] ENDOSCOPY;  Service: Gastroenterology;  Laterality: N/A;   ESOPHAGOGASTRODUODENOSCOPY (EGD) WITH PROPOFOL N/A 01/12/2020   Procedure: ESOPHAGOGASTRODUODENOSCOPY (EGD) WITH PROPOFOL;  Surgeon:  Wyline Mood, MD;  Location: [facility] ENDOSCOPY;  Service: Gastroenterology;  Laterality: N/A;   ESOPHAGOGASTRODUODENOSCOPY (EGD) WITH PROPOFOL N/A 04/28/2020   Procedure: ESOPHAGOGASTRODUODENOSCOPY (EGD) WITH PROPOFOL;  Surgeon: Wyline Mood, MD;  Location: [facility] ENDOSCOPY;  Service: Gastroenterology;  Laterality: N/A;   EUS N/A 05/07/2019   Procedure: FULL UPPER ENDOSCOPIC ULTRASOUND (EUS) RADIAL;  Surgeon: Rayann Heman, MD;  Location: [facility] ENDOSCOPY;  Service: Endoscopy;  Laterality: N/A;   ILEOSCOPY N/A 07/22/2018   Procedure: ILEOSCOPY THROUGH STOMA;  Surgeon: Pasty Spillers, MD;  Location: [facility] ENDOSCOPY;  Service: Endoscopy;  Laterality: N/A;   ILEOSTOMY     ILEOSTOMY N/A 09/08/2018   Procedure: ILEOSTOMY REVISION POSSIBLE CREATION;  Surgeon: Carolan Shiver, MD;  Location: [facility] ORS;  Service: General;  Laterality: N/A;   ILEOSTOMY CLOSURE N/A 08/15/2018   Procedure: DILATION OF ILEOSTOMY STRICTURE;  Surgeon: Carolan Shiver, MD;  Location: [facility] ORS;  Service: General;  Laterality: N/A;   LAPAROTOMY Right 05/04/2018   Procedure: EXPLORATORY LAPAROTOMY right colectomy right and left ostomy;  Surgeon: Carolan Shiver, MD;  Location: [facility] ORS;  Service: General;  Laterality: Right;   LEFT ATRIAL APPENDAGE OCCLUSION N/A 01/11/2021   Procedure: LEFT ATRIAL APPENDAGE OCCLUSION (WATCHMAN's DEVICE); ABORTED PROCEDURE WITHOUT DEVICE BEING IMPLANTED; Location: [facility]; Surgeon: Gerre Pebbles, MD   LUNG BIOPSY     MASTECTOMY Left    2000, [facility]   ROTATOR CUFF REPAIR Right    [facility]   XI ROBOTIC ASSISTED COLOSTOMY TAKEDOWN N/A 10/23/2021   Procedure: XI ROBOTIC ASSISTED ILEOSTOMY TAKEDOWN;  Surgeon: Carolan Shiver, MD;  Location: [facility] ORS;  Service: General;  Laterality: N/A;  180 minutes for the surgery part please     FAMILY HISTORY   Family History  Problem Relation Age of Onset   Breast cancer Mother 71   Cancer Mother        Breast    Cirrhosis Father    Breast cancer  Paternal Aunt 33   Cancer Maternal Aunt        Breast      SOCIAL HISTORY   Social History   Tobacco Use   Smoking status: Former    Packs/day: 0.50    Years: 20.00    Pack years: 10.00    Types: Cigarettes    Quit date: 07/02/2012    Years since quitting: 9.3   Smokeless tobacco: Current    Types: Snuff   Tobacco comments:    quit 2014  Vaping Use   Vaping Use: Never used  Substance Use Topics   Alcohol use: Yes    Comment: Occasionally beer   Drug use: No     MEDICATIONS    Home Medication:    Current Medication:  Current Facility-Administered Medications:    albuterol (PROVENTIL) (2.5 MG/3ML) 0.083% nebulizer solution 3 mL, 3 mL, Nebulization, Q6H PRN, [name]-Diaz, Edgardo, MD, 3 mL at 11/12/21 0334   Ampicillin-Sulbactam (UNASYN) 3 g in sodium chloride 0.9 % 100 mL IVPB, 3 g, Intravenous, Q12H, Charise Killian, MD, Last Rate: 200 mL/hr at 11/13/21 0027, 3 g at 11/13/21 [phone]  Chlorhexidine Gluconate Cloth 2 % PADS 6 each, 6 each, Topical, Daily, Herbert Pun, MD, 6 each at 11/12/21 0952   citalopram (CELEXA) tablet 40 mg, 40 mg, Oral, [id number], [name]-Diaz, Reeves Forth, MD, 40 mg at 11/13/21 0537   diltiazem (CARDIZEM CD) 24 hr capsule 240 mg, 240 mg, Oral, Daily, Tang, Lily Michelle, PA-C, 240 mg at 11/13/21 0915   famotidine (PEPCID) tablet 20 mg, 20 mg, Oral, Daily, [name]-Diaz, Edgardo, MD, 20 mg at 11/13/21 0915   feeding supplement (BOOST / RESOURCE BREEZE) liquid 1 Container, 1 Container, Oral, TID BM, [name]-Diaz, Edgardo, MD   fluticasone furoate-vilanterol (BREO ELLIPTA) 100-25 MCG/ACT 1 puff, 1 puff, Inhalation, Daily, 1 puff at 11/13/21 0924 **AND** umeclidinium bromide (INCRUSE ELLIPTA) 62.5 MCG/ACT 1 puff, 1 puff, Inhalation, Daily, Benita Gutter, RPH, 1 puff at 11/13/21 0924   furosemide (LASIX) injection 40 mg, 40 mg, Intravenous, BID, Breeze, Shantelle, NP, 40 mg at 11/13/21 0900   gabapentin (NEURONTIN) capsule 300 mg, 300 mg, Oral,  BID, [name]-Diaz, Edgardo, MD, 300 mg at 11/13/21 0915   HYDROmorphone (DILAUDID) injection 0.5 mg, 0.5 mg, Intravenous, Q2H PRN, Piscoya, Jose, MD, 0.5 mg at 11/09/21 1607   lip balm (BLISTEX) ointment, , Topical, PRN, Herbert Pun, MD, Given at 11/06/21 0905   menthol-cetylpyridinium (CEPACOL) lozenge 3 mg, 1 lozenge, Oral, PRN, Herbert Pun, MD, 3 mg at 11/07/21 0522   metoprolol tartrate (LOPRESSOR) injection 5 mg, 5 mg, Intravenous, Q6H PRN, Sharion Settler, NP, 5 mg at 10/31/21 1553   multivitamin with minerals tablet 1 tablet, 1 tablet, Oral, Daily, [name]-Diaz, Reeves Forth, MD   ondansetron (ZOFRAN-ODT) disintegrating tablet 4 mg, 4 mg, Oral, Q6H PRN, 4 mg at 11/07/21 1255 **OR** ondansetron (ZOFRAN) injection 4 mg, 4 mg, Intravenous, Q6H PRN, Herbert Pun, MD, 4 mg at 10/29/21 0051   oxyCODONE-acetaminophen (PERCOCET/ROXICET) 5-325 MG per tablet 1-2 tablet, 1-2 tablet, Oral, Q4H PRN, Herbert Pun, MD, 2 tablet at 11/13/21 0537   pantoprazole (PROTONIX) EC tablet 40 mg, 40 mg, Oral, QHS, Benita Gutter, RPH, 40 mg at 11/12/21 2221   sodium chloride (OCEAN) 0.65 % nasal spray 1 spray, 1 spray, Each Nare, PRN, Sharion Settler, NP, 1 spray at 11/12/21 1206   sodium chloride flush (NS) 0.9 % injection 10-40 mL, 10-40 mL, Intracatheter, Q12H, [name]-Diaz, Edgardo, MD, 10 mL at 11/13/21 0920   sodium chloride flush (NS) 0.9 % injection 10-40 mL, 10-40 mL, Intracatheter, PRN, Herbert Pun, MD   Vitamin D (Ergocalciferol) (DRISDOL) capsule 50,000 Units, 50,000 Units, Oral, Q7 days, Murlean Iba, MD, 50,000 Units at 11/06/21 1504    ALLERGIES   Patient has no known allergies.     REVIEW OF SYSTEMS    Review of Systems:  Gen:  Denies  fever, sweats, chills weigh loss  HEENT: Denies blurred vision, double vision, ear pain, eye pain, hearing loss, nose bleeds, sore throat Cardiac:  No dizziness, chest pain or heaviness, chest  tightness,edema Resp:   Denies cough or sputum porduction, shortness of breath,wheezing, hemoptysis,  Gi: Denies swallowing difficulty, stomach pain, nausea or vomiting, diarrhea, constipation, bowel incontinence Gu:  Denies bladder incontinence, burning urine Ext:   Denies Joint pain, stiffness or swelling Skin: Denies  skin rash, easy bruising or bleeding or hives Endoc:  Denies polyuria, polydipsia , polyphagia or weight change Psych:   Denies depression, insomnia or hallucinations   Other:  All other systems negative   VS: BP 99/69 (BP Location: Left Leg)    Pulse 87    Temp 98 F (  36.7 C) (Oral)    Resp 18    Ht $R'5\' 6"'TX$  (1.676 m)    Wt 64 kg    SpO2 98%    BMI 22.77 kg/m      PHYSICAL EXAM    GENERAL:NAD, no fevers, chills, no weakness no fatigue HEAD: Normocephalic, atraumatic.  EYES: Pupils equal, round, reactive to light. Extraocular muscles intact. No scleral icterus.  MOUTH: Moist mucosal membrane. Dentition intact. No abscess noted.  EAR, NOSE, THROAT: Clear without exudates. No external lesions.  NECK: Supple. No thyromegaly. No nodules. No JVD.  PULMONARY: mild rhochi worse on right CARDIOVASCULAR: S1 and S2. Regular rate and rhythm. No murmurs, rubs, or gallops. No edema. Pedal pulses 2+ bilaterally.  GASTROINTESTINAL: Soft, nontender, nondistended. No masses. Positive bowel sounds. No hepatosplenomegaly.  MUSCULOSKELETAL: No swelling, clubbing, or edema. Range of motion full in all extremities.  NEUROLOGIC: Cranial nerves II through XII are intact. No gross focal neurological deficits. Sensation intact. Reflexes intact.  SKIN: No ulceration, lesions, rashes, or cyanosis. Skin warm and dry. Turgor intact.  PSYCHIATRIC: Mood, affect within normal limits. The patient is awake, alert and oriented x 3. Insight, judgment intact.       IMAGING    CT ABDOMEN PELVIS WO CONTRAST  Result Date: 11/05/2021 CLINICAL DATA:  Sepsis, fever, postoperative, rule out  intra-abdominal abscess EXAM: CT ABDOMEN AND PELVIS WITHOUT CONTRAST TECHNIQUE: Multidetector CT imaging of the abdomen and pelvis was performed following the standard protocol without IV contrast. Oral enteric contrast was administered. RADIATION DOSE REDUCTION: This exam was performed according to the departmental dose-optimization program which includes automated exposure control, adjustment of the mA and/or kV according to patient size and/or use of iterative reconstruction technique. COMPARISON:  10/27/2021 FINDINGS: Lower chest: Small right pleural effusion associated atelectasis or consolidation. Three-vessel coronary artery calcifications. Hepatobiliary: No solid liver abnormality is seen. No gallstones, gallbladder wall thickening, or biliary dilatation. Pancreas: Unremarkable. No pancreatic ductal dilatation or surrounding inflammatory changes. Spleen: Normal in size without significant abnormality. Adrenals/Urinary Tract: Adrenal glands are unremarkable. Kidneys are normal, without renal calculi, solid lesion, or hydronephrosis. Bladder is unremarkable. Stomach/Bowel: Stomach is within normal limits. The mid small bowel is diffusely gas and contrast filled, and mildly distended, largest loops measuring 4.1 cm. Status post right hemicolectomy. Descending and sigmoid diverticula. Vascular/Lymphatic: Aortic atherosclerosis. No enlarged abdominal or pelvic lymph nodes. Reproductive: No mass or other significant abnormality. Other: Anasarca. Persistent, although improved, scattered subcutaneous emphysema about the abdominal wall (series 2, image 38). Unchanged subcutaneous fluid collections of the left and right ventral abdomen, collection on the left measuring 3.4 x 2.2 cm (series 2, image 36), collection on the right measuring 3.5 x 2.0 cm (series 2, image 43). No ascites. Musculoskeletal: No acute or significant osseous findings. IMPRESSION: 1. Unchanged subcutaneous fluid collections of the left and right  ventral abdomen, collection on the left measuring 3.4 x 2.2 cm, collection on the right measuring 3.5 x 2.0 cm. These most likely reflect postoperative hematoma or seroma, although presence or absence of infection within this fluid is not established by CT. 2. Status post right hemicolectomy. 3. The mid small bowel is diffusely gas and contrast filled, and mildly distended, largest loops measuring 4.1 cm. Findings are most consistent with postoperative ileus. 4. Right pleural effusion and anasarca. 5. Coronary artery disease. Aortic Atherosclerosis (ICD10-I70.0). Electronically Signed   By: Delanna Ahmadi M.D.   On: 11/05/2021 17:34   CT ABDOMEN PELVIS WO CONTRAST  Result Date: 10/27/2021 CLINICAL DATA:  Postop day 4 from ileostomy reversal. Vomiting. Evaluate for anastomotic leak. EXAM: CT ABDOMEN AND PELVIS WITHOUT CONTRAST TECHNIQUE: Multidetector CT imaging of the abdomen and pelvis was performed following the standard protocol without IV contrast. RADIATION DOSE REDUCTION: This exam was performed according to the departmental dose-optimization program which includes automated exposure control, adjustment of the mA and/or kV according to patient size and/or use of iterative reconstruction technique. COMPARISON:  Abdominopelvic CT 11/05/2020.  Radiographs 10/26/2021. FINDINGS: Study was performed with the patient in the right lateral decubitus position. Rectal contrast was administered. Lower chest: Apparent right middle lobe collapse with a small amount of adjacent pleural fluid, incompletely visualized. Mild dependent atelectasis in both lower lobes. There is atherosclerosis of the aorta and coronary arteries. Hepatobiliary: Stable low-density liver lesions, likely cysts based on stability. Stable mild biliary dilatation status post cholecystectomy. Pancreas: Unremarkable. No pancreatic ductal dilatation or surrounding inflammatory changes. Spleen: Normal in size without focal abnormality. Adrenals/Urinary  Tract: Both adrenal glands appear normal. Both kidneys demonstrate mild perinephric soft tissue stranding, similar to previous study. No evidence of urinary tract calculus or hydronephrosis. The bladder appears unremarkable for its degree of distention. Stomach/Bowel: No oral contrast was administered. Rectal contrast has passed retrograde through the colon and through the ileocolonic anastomosis at the level of the mid transverse colon status post right hemicolectomy. There is retrograde filling of the distal small bowel. No anastomotic leak. The stomach and small bowel are fluid-filled and mildly distended. No evidence of bowel wall thickening or pneumatosis. Vascular/Lymphatic: There are no enlarged abdominal or pelvic lymph nodes. Diffuse aortic and branch vessel atherosclerosis. Reproductive: Stable appearance of the uterus and adnexa. No adnexal mass. Tubal ligation clips noted. Other: As seen on the postoperative radiographs, there is a large amount of soft tissue emphysema throughout the anterior abdominal wall which extends from the perineum into the presternal soft tissues. There are small focal fluid collections within the right and left anterior abdominal wall, likely related to recent laparoscopic procedure. No free intraperitoneal air, ascites or focal extraluminal fluid collections identified within the peritoneal cavity. Musculoskeletal: No acute or significant osseous findings. Stable chronic mild superior endplate compression fractures at L3 and L5. IMPRESSION: 1. Status post recent ileostomy reversal and ileocolonic anastomosis. The anastomosis is patent, without leak. 2. Nonspecific gastric and proximal small bowel distension without focal transition point, likely related to a postoperative ileus. Recommend radiographic follow-up. Patient may benefit from a nasogastric tube. 3. As seen on postoperative radiographs, there is extensive soft tissue emphysema throughout the abdominal wall which is  likely related to the laparoscopic procedure. Soft tissue infection should be excluded clinically. Small fluid collections in the anterior abdominal wall are attributed to the procedure, and no unexpected collections are identified. There is no pneumoperitoneum or abnormal intra-abdominal fluid collection. 4. Atelectasis at both lung bases with apparent partial right middle lobe collapse. 5. Extensive Aortic Atherosclerosis (ICD10-I70.0). Electronically Signed   By: Richardean Sale M.D.   On: 10/27/2021 16:28   DG Chest 1 View  Result Date: 10/26/2021 CLINICAL DATA:  Hypotension EXAM: CHEST  1 VIEW COMPARISON:  11/16/2020, 06/05/2020, PET CT 12/01/2019, CT chest 11/06/2019, 11/06/2019 FINDINGS: Mild cardiomegaly with central vascular congestion. Small pleural effusions and diffuse increased interstitial opacity likely due to edema. Aortic atherosclerosis. No pneumothorax. Irregular opacity with mild distortion and nodularity in the right upper lobe and right hilus similar compared to intermittent previous exams and presumably due to scarring. IMPRESSION: 1. Cardiomegaly with vascular congestion, small pleural effusions and  mild diffuse interstitial edema 2. Irregular and nodular opacities in the right hilus and upper lobe present on previous exams and probably due to scarring Electronically Signed   By: Donavan Foil M.D.   On: 10/26/2021 17:54   DG Chest 2 View  Result Date: 11/09/2021 CLINICAL DATA:  Shortness of breath.  Mild chest pain.  Smoker. EXAM: CHEST - 2 VIEW COMPARISON:  11/05/2021 FINDINGS: Right PICC tip in the region of the inferior aspect of the superior vena cava without significant change. The nasogastric tube has been removed. Normal sized heart. Stable linear scarring in the right upper lobe. No significant change in a small right pleural effusion and right basilar airspace opacity. Clear left lung. Mildly tortuous and calcified thoracic aorta. No acute bony abnormality. IMPRESSION:  Stable small right pleural effusion and right basilar atelectasis and/or pneumonia. Electronically Signed   By: Claudie Revering M.D.   On: 11/09/2021 10:17   DG Abd 1 View  Result Date: 11/03/2021 CLINICAL DATA:  NG tube placement EXAM: ABDOMEN - 1 VIEW COMPARISON:  11/03/2021 11:05 a.m. FINDINGS: Advancement of NG tube with tip and side port below the GE junction. Redemonstrated small bowel dilatation, not significantly changed from the prior exam. The stomach is not distended. The lower abdomen is not included for evaluation. IMPRESSION: Interval advancement of nasogastric tube, with tip and side port overlying the stomach. Overall unchanged small bowel dilatation in the imaged portion of the abdomen. Electronically Signed   By: Merilyn Baba M.D.   On: 11/03/2021 16:01   DG Abd 1 View  Result Date: 10/28/2021 CLINICAL DATA:  NG tube placement EXAM: ABDOMEN - 1 VIEW COMPARISON:  10/26/2021 FINDINGS: Limited radiograph of the lower chest and upper abdomen was obtained for the purposes of enteric tube localization. Enteric tube is seen coursing below the diaphragm with distal tipterminating within the proximal stomach. Side port terminates within the distal esophagus. Similar degree of gaseous distension of large and small bowel within the visualized abdomen. IMPRESSION: Enteric tube with distal tip terminating within the proximal stomach, side port terminating within the distal esophagus. Recommend advancement 7-10 cm. Electronically Signed   By: Davina Poke D.O.   On: 10/28/2021 15:48   DG Abd 1 View  Result Date: 10/26/2021 CLINICAL DATA:  Hypotension history of ileostomy take down EXAM: ABDOMEN - 1 VIEW COMPARISON:  11/05/2020 FINDINGS: Clips in the pelvis. Moderate air distension of the stomach. Air-filled dilated small bowel up to 3.5 cm in the central abdomen. Extensive mottled appearance of the abdominal soft tissues presumably due to soft tissue emphysema related to recent laparoscopic  procedure. IMPRESSION: 1. Air distension of stomach and small bowel either due to ileus or possible obstruction. 2. Diffuse mottled appearance of soft tissues felt consistent with soft tissue emphysema likely due to recent laparoscopic procedure Electronically Signed   By: Donavan Foil M.D.   On: 10/26/2021 17:51   US RENAL  Result Date: 10/25/2021 CLINICAL DATA:  Acute kidney injury EXAM: RENAL / URINARY TRACT ULTRASOUND COMPLETE COMPARISON:  CT 11/05/2020 FINDINGS: Right Kidney: Renal measurements: 8.9 x 4.1 x 4.2 cm = volume: 79.8 mL. Cortex slightly echogenic. No mass or hydronephrosis Left Kidney: Renal measurements: 7.5 x 4.4 x 4 cm = volume: 88.5 mL. Cortex slightly echogenic. No mass or hydronephrosis. Bladder: Appears normal for degree of bladder distention. Other: None. IMPRESSION: Slight increased cortical echogenicity suggesting medical renal disease. No hydronephrosis. Electronically Signed   By: Donavan Foil M.D.   On: 10/25/2021 16:28  DG Chest Port 1 View  Result Date: 11/13/2021 CLINICAL DATA:  Post right thoracentesis EXAM: PORTABLE CHEST 1 VIEW COMPARISON:  Chest x-ray dated November 12, 2021 FINDINGS: Cardiac and mediastinal contours are unchanged. Linear nodular opacity of the right upper lobe, unchanged from prior exam. No new parenchymal opacity. Interval decreased size of small right pleural effusion. No evidence pneumothorax. IMPRESSION: No evidence of pneumothorax. Electronically Signed   By: Allegra Lai M.D.   On: 11/13/2021 11:46   DG Chest Port 1 View  Result Date: 11/12/2021 CLINICAL DATA:  Acute respiratory failure with hypoxia. EXAM: PORTABLE CHEST 1 VIEW COMPARISON:  November 09, 2021 FINDINGS: Right upper extremity PICC with tip near the superior cavoatrial junction. The heart size and mediastinal contours are within normal limits. Stable linear scarring in the right upper lobe. Slightly increased small right pleural effusion with adjacent airspace opacities.  Increased conspicuity of the irregular nodular areas of consolidation in the right mid lung. The visualized skeletal structures are unremarkable. IMPRESSION: 1. Slightly increased small right pleural effusion with adjacent airspace opacities. 2. Increased conspicuity of the irregular nodular areas of consolidation in the right mid lung. Electronically Signed   By: Maudry Mayhew M.D.   On: 11/12/2021 08:55   DG Chest Port 1 View  Result Date: 11/05/2021 CLINICAL DATA:  Fever. EXAM: PORTABLE CHEST 1 VIEW COMPARISON:  Chest radiograph 10/26/2021. FINDINGS: Right upper extremity PICC line tip projects over the superior vena cava. Enteric tube is present. The side port is at the level of the distal esophagus. Stable enlarged cardiac and mediastinal contours. Similar-appearing distortion and consolidation within the mid right lung. Bibasilar atelectasis, right-greater-than-left. Probable small right pleural effusion. No pneumothorax. IMPRESSION: Enteric tube side-port at the distal esophagus, recommend advancement. Right upper extremity PICC line tip projects over the superior vena cava. Similar-appearing irregular and nodular areas of consolidation within the right mid lung. These results will be called to the ordering clinician or representative by the Radiologist Assistant, and communication documented in the PACS or [employer]. Electronically Signed   By: Annia Belt M.D.   On: 11/05/2021 08:04   DG Abd 2 Views  Result Date: 11/06/2021 CLINICAL DATA:  Abdominal pain EXAM: ABDOMEN - 2 VIEW COMPARISON:  Abdominal x-ray 11/03/2021 FINDINGS: Enteric tube identified with the tip in the lower esophagus. Mildly distended loops of small bowel are again seen throughout the abdomen measuring up to 3.3 cm in diameter. No free air identified on the decubitus view. No suspicious calcifications identified. IMPRESSION: 1. Enteric tube tip is in the lower esophagus, advancement recommended. 2. Mildly distended loops of  bowel throughout the abdomen which could be seen with ileus or obstruction. Continued follow-up recommended. Electronically Signed   By: Jannifer Hick M.D.   On: 11/06/2021 10:42   DG Abd 2 Views  Result Date: 11/03/2021 CLINICAL DATA:  Abdominal distention EXAM: ABDOMEN - 2 VIEW COMPARISON:  Previous studies including the examination of 11/02/2021 FINDINGS: There is interval worsening of small bowel dilation measuring up to 4.9 cm in diameter. Tip enteric tube is noted at the gastroesophageal junction pointing cephalad. There is no distention of stomach. There is interval passage of contrast from the colon. There is evidence of previous tubal ligation. IMPRESSION: There is interval increase in small bowel dilation suggesting high-grade small bowel obstruction. Tip of enteric tube is at the gastroesophageal junction pointing cephalad. NG tube should be advanced 5-10 cm to place the tip and side port within the stomach. Electronically Signed  By: Elmer Picker M.D.   On: 11/03/2021 12:36   DG Abd 2 Views  Result Date: 10/31/2021 CLINICAL DATA:  Ileus following gastrointestinal surgery, colostomy takedown 10/23/2021 EXAM: ABDOMEN - 2 VIEW COMPARISON:  10/28/2021 FINDINGS: Nasogastric tube in proximal stomach. Persistent dilatation of small bowel loops in the abdomen without wall thickening. Small amount retained contrast in colon. Levoconvex lumbar scoliosis. Foci of soft tissue gas at RIGHT lateral abdominal wall consistent with preceding surgery. Atelectasis at RIGHT lung base. IMPRESSION: Persistent small bowel ileus. Electronically Signed   By: Lavonia Dana M.D.   On: 10/31/2021 10:59   DG Abd Portable 1V-Small Bowel Obstruction Protocol-initial, 8 hr delay  Result Date: 11/02/2021 CLINICAL DATA:  8 hour follow-up small-bowel delay EXAM: PORTABLE ABDOMEN - 1 VIEW COMPARISON:  Film from the previous day. FINDINGS: Gastric catheter is noted within the stomach. Contrast material administered now  lies within the dilated small bowel as well as within the colon. These changes are consistent with a partial small bowel obstruction. No free air is noted. IMPRESSION: Passage of contrast into the colon consistent with a partial small bowel obstruction. Electronically Signed   By: Inez Catalina M.D.   On: 11/02/2021 03:13   DG Abd Portable 1V  Result Date: 11/01/2021 CLINICAL DATA:  Enteric catheter placement EXAM: PORTABLE ABDOMEN - 1 VIEW COMPARISON:  10/31/2021 FINDINGS: Frontal view of the lower chest and upper abdomen demonstrates enteric catheter tip and side port projecting over the gastric fundus. Right-sided PICC tip overlies atriocaval junction. Cardiac silhouette is enlarged. Small right pleural effusion. Stable vascular congestion. Stable distended gas-filled loops of small bowel, likely ileus given recent surgery. IMPRESSION: 1. Enteric catheter projecting over gastric fundus. 2. Stable small bowel dilatation consistent with ileus. Electronically Signed   By: Randa Ngo M.D.   On: 11/01/2021 16:02   ECHOCARDIOGRAM COMPLETE  Result Date: 10/27/2021    ECHOCARDIOGRAM REPORT   Patient Name:   KIMBERLYNN LUMBRA [facility] Date of Exam: 10/27/2021 Medical Rec #:  [id number]       Height:       66.0 in Accession #:    [id number]      Weight:       124.0 lb Date of Birth:  27-Mar-1956       BSA:          54.632 m Patient Age:    1 years        BP:           144/84 mmHg Patient Gender: F               HR:           95 bpm. Exam Location:  [facility] Procedure: 2D Echo, Cardiac Doppler and Color Doppler Indications:     Elevated troponin  History:         Patient has prior history of Echocardiogram examinations, most                  recent 11/07/2019. COPD, Arrythmias:Atrial Fibrillation,                  Signs/Symptoms:Murmur; Risk Factors:Hypertension.  Sonographer:     Sherrie Sport Referring Phys:  [place] Diagnosing Phys: Donnelly Angelica IMPRESSIONS  1. Left ventricular ejection fraction, by estimation, is 60  to 65%. The left ventricle has normal function. The left ventricle has no regional wall motion abnormalities. Left ventricular diastolic parameters are consistent with Grade I diastolic dysfunction (impaired relaxation).  2. Right ventricular systolic function is normal. The right ventricular size is mildly enlarged.  3. Right atrial size was mildly dilated.  4. The mitral valve is normal in structure. Moderate mitral valve regurgitation. No evidence of mitral stenosis.  5. Tricuspid valve regurgitation is moderate.  6. The aortic valve is calcified. Aortic valve regurgitation is not visualized. Mild aortic valve stenosis.  7. The inferior vena cava is normal in size with greater than 50% respiratory variability, suggesting right atrial pressure of 3 mmHg. Conclusion(s)/Recommendation(s): CALCIFIED AORTIC VALVE WITH MILD AS. VMax 2.1 m/s. Dimensionless index 0.4. FINDINGS  Left Ventricle: Left ventricular ejection fraction, by estimation, is 60 to 65%. The left ventricle has normal function. The left ventricle has no regional wall motion abnormalities. The left ventricular internal cavity size was normal in size. There is  no left ventricular hypertrophy. Left ventricular diastolic parameters are consistent with Grade I diastolic dysfunction (impaired relaxation). Right Ventricle: The right ventricular size is mildly enlarged. No increase in right ventricular wall thickness. Right ventricular systolic function is normal. Left Atrium: Left atrial size was normal in size. Right Atrium: Right atrial size was mildly dilated. Pericardium: There is no evidence of pericardial effusion. Mitral Valve: The mitral valve is normal in structure. Moderate mitral valve regurgitation. No evidence of mitral valve stenosis. MV peak gradient, 3.1 mmHg. The mean mitral valve gradient is 1.0 mmHg. Tricuspid Valve: The tricuspid valve is normal in structure. Tricuspid valve regurgitation is moderate . No evidence of tricuspid stenosis.  Aortic Valve: The aortic valve is calcified. Aortic valve regurgitation is not visualized. Mild aortic stenosis is present. Aortic valve mean gradient measures 9.4 mmHg. Aortic valve peak gradient measures 16.8 mmHg. Aortic valve area, by VTI measures 1.21 cm. Pulmonic Valve: The pulmonic valve was not well visualized. Pulmonic valve regurgitation is not visualized. No evidence of pulmonic stenosis. Aorta: The aortic root is normal in size and structure. Venous: The inferior vena cava is normal in size with greater than 50% respiratory variability, suggesting right atrial pressure of 3 mmHg. IAS/Shunts: No atrial level shunt detected by color flow Doppler.  LEFT VENTRICLE PLAX 2D LVIDd:         4.80 cm   Diastology LVIDs:         3.00 cm   LV e' medial:    5.87 cm/s LV PW:         1.10 cm   LV E/e' medial:  13.9 LV IVS:        0.75 cm   LV e' lateral:   12.40 cm/s LVOT diam:     2.00 cm   LV E/e' lateral: 6.6 LV SV:         45 LV SV Index:   28 LVOT Area:     3.14 cm  RIGHT VENTRICLE RV Basal diam:  3.80 cm RV S prime:     12.20 cm/s TAPSE (M-mode): 1.8 cm LEFT ATRIUM             Index        RIGHT ATRIUM           Index LA diam:        3.20 cm 1.96 cm/m   RA Area:     19.30 cm LA Vol (A2C):   58.8 ml 36.03 ml/m  RA Volume:   63.90 ml  39.15 ml/m LA Vol (A4C):   38.7 ml 23.71 ml/m LA Biplane Vol: 49.1 ml 30.08 ml/m  AORTIC VALVE  PULMONIC VALVE AV Area (Vmax):    1.04 cm      PV Vmax:        0.59 m/s AV Area (Vmean):   0.91 cm      PV Vmean:       35.200 cm/s AV Area (VTI):     1.21 cm      PV VTI:         0.109 m AV Vmax:           205.20 cm/s   PV Peak grad:   1.4 mmHg AV Vmean:          140.200 cm/s  PV Mean grad:   1.0 mmHg AV VTI:            0.373 m       RVOT Peak grad: 4 mmHg AV Peak Grad:      16.8 mmHg AV Mean Grad:      9.4 mmHg LVOT Vmax:         67.90 cm/s LVOT Vmean:        40.500 cm/s LVOT VTI:          0.144 m LVOT/AV VTI ratio: 0.39  AORTA Ao Root diam: 3.17 cm MITRAL  VALVE               TRICUSPID VALVE MV Area (PHT): 4.86 cm    TR Peak grad:   52.4 mmHg MV Area VTI:   3.01 cm    TR Vmax:        362.00 cm/s MV Peak grad:  3.1 mmHg MV Mean grad:  1.0 mmHg    SHUNTS MV Vmax:       0.88 m/s    Systemic VTI:  0.14 m MV Vmean:      55.0 cm/s   Systemic Diam: 2.00 cm MV Decel Time: 156 msec    Pulmonic VTI:  0.157 m MV E velocity: 81.40 cm/s MV A velocity: 51.40 cm/s MV E/A ratio:  1.58 Donnelly Angelica Electronically signed by Donnelly Angelica Signature Date/Time: 10/27/2021/4:07:10 PM    Final    [place] EKG SITE RITE  Result Date: 11/01/2021 If Site Rite image not attached, placement could not be confirmed due to current cardiac rhythm.  US THORACENTESIS ASP PLEURAL SPACE W/IMG GUIDE  Result Date: 11/13/2021 INDICATION: Right pleural effusion with shortness breath request received for diagnostic and therapeutic thoracentesis. EXAM: ULTRASOUND GUIDED RIGHT THORACENTESIS MEDICATIONS: Local 1% lidocaine only. COMPLICATIONS: None immediate. PROCEDURE: An ultrasound guided thoracentesis was thoroughly discussed with the patient and questions answered. The benefits, risks, alternatives and complications were also discussed. The patient understands and wishes to proceed with the procedure. Written consent was obtained. Ultrasound was performed to localize and mark an adequate pocket of fluid in the right chest. The area was then prepped and draped in the normal sterile fashion. 1% Lidocaine was used for local anesthesia. Under ultrasound guidance a 19 gauge, 7-cm, Yueh catheter was introduced. Thoracentesis was performed. The catheter was removed and a dressing applied. FINDINGS: A total of approximately 1 L of amber colored fluid was removed. Samples were sent to the laboratory as requested by the clinical team. IMPRESSION: Successful ultrasound guided right thoracentesis yielding 1 L of pleural fluid. This exam was performed by Tsosie Billing PA-C, and was supervised and interpreted by Dr.  Denna Haggard. Electronically Signed   By: Albin Felling M.D.   On: 11/13/2021 12:05      ASSESSMENT/PLAN   Acute on chronic hypoxemic respiratory failure   -  Due to moderate right pleural effusion with associated right lower lobe complete atelectasis   -s/p thoracentesis - 1 L - aspirated -    Moderate pleural effusion   S/p aspiration - fluid pattern studies in process     Suspect transudative fluid due to CKD - Noted nephrology on case - appreciate input   Right lower lobe atelectasis   - patient would benefit from recruitment maneuvers    - will order RT Metaneb with albuterol BID    - Incentive spirometry and flutter valve PRN  CKD   - likely culprit with fluid overload contributing to pulmonary and gut edema   Atrial fibrillation - chronic - potential for rapid ventricular response due to albuterol/ipratropium therapy     Thank you for allowing me to participate in the care of this patient.  Total face to face encounter time for this patient visit was >45 min. >50% of the time was  spent in counseling and coordination of care.   Patient/Family are satisfied with care plan and all questions have been answered.  This document was prepared using Dragon voice recognition software and may include unintentional dictation errors.     Ottie Glazier, M.D.  Division of Pulmonary & [place]         .fa

## 2021-11-13 NOTE — Procedures (Signed)
PROCEDURE SUMMARY:  Successful US guided right thoracentesis. Yielded 1 liter of amber colored fluid. Pt tolerated procedure well. No immediate complications.  Specimen was sent for labs. CXR ordered.  EBL < 1 mL  Hedy Jacob PA-C 11/13/2021 11:26 AM

## 2021-11-13 NOTE — Progress Notes (Signed)
Nutrition Follow-up  DOCUMENTATION CODES:   Non-severe (moderate) malnutrition in context of chronic illness  INTERVENTION:   -Boost Breeze po TID, each supplement provides 250 kcal and 9 grams of protein  -MVI with minerals daily -Mighty Shake TID with meals, each supplement provides 220 kcals and 9 grams protein  NUTRITION DIAGNOSIS:   Moderate Malnutrition related to cancer and cancer related treatments as evidenced by mild fat depletion, mild muscle depletion, severe muscle depletion.  Ongoing  GOAL:   Patient will meet greater than or equal to 90% of their needs  Progressing   MONITOR:   PO intake, Diet advancement, Labs, Weight trends, Skin, I & O's  REASON FOR ASSESSMENT:   Malnutrition Screening Tool    ASSESSMENT:   66 y.o. 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 RD helped adjust hospital gown per her request. Pt shares that she is tolerating current diet well, but did not eat much breakfast, as she was interrupted with nursing care- pt consumed about 50% of boiled and and 25% of a biscuit. Noted meal completions 25-50%.   Discussed importance of good meal and supplement intake to promote healing. Pt reports she used to be able to tolerate  Ensure supplements, however, now experiences diarrhea after consuming (per pt, she reports she would drink these often when she received treatment at the cancer centers). Reviewed supplements on formulary; pt amenable to try Boost Breeze. She denies further episodes of nausea, vomiting, or diarrhea at this time.   Medications reviewed and cardizem, lasix and vitamin D.  Labs reviewed: CBGS: 127.   Diet Order:   Diet Order             DIET SOFT Room service appropriate? Yes; Fluid consistency: Thin  Diet effective now                   EDUCATION NEEDS:   Education needs have been addressed  Skin:  Skin Assessment: Skin Integrity Issues: Skin Integrity Issues:: Incisions Incisions: closed abdomen  Last BM:  11/09/21 (type 7)  Height:   Ht Readings from Last 1 Encounters:  10/23/21 5\' 6"  (1.676 m)    Weight:   Wt Readings from Last 1 Encounters:  11/11/21 64 kg    Ideal Body Weight:  59 kg  BMI:  Body mass index is 22.77 kg/m.  Estimated Nutritional Needs:   Kcal:  1900-2100  Protein:  95-110 grams  Fluid:  > 1.9 L    Loistine Chance, RD, LDN, [place] Registered Dietitian II Certified Diabetes Care and Education Specialist Please refer to [facility] for RD and/or RD on-call/weekend/after hours pager

## 2021-11-14 ENCOUNTER — Inpatient Hospital Stay: Payer: Medicare Other

## 2021-11-14 LAB — CYTOLOGY - NON PAP

## 2021-11-14 LAB — GLUCOSE, CAPILLARY: Glucose-Capillary: 109 mg/dL — ABNORMAL HIGH (ref 70–99)

## 2021-11-14 MED ORDER — APIXABAN 2.5 MG PO TABS
2.5000 mg | ORAL_TABLET | Freq: Two times a day (BID) | ORAL | Status: DC
  Administered 2021-11-14 – 2021-11-24 (×20): 2.5 mg via ORAL
  Filled 2021-11-14 (×20): qty 1

## 2021-11-14 MED ORDER — ALUM & MAG HYDROXIDE-SIMETH 200-200-20 MG/5ML PO SUSP
15.0000 mL | ORAL | Status: DC | PRN
  Administered 2021-11-14: 30 mL via ORAL
  Filled 2021-11-14: qty 30

## 2021-11-14 NOTE — Plan of Care (Signed)
Pt alert and oriented x 4. Dry dsg to abdomen at surgical sites. +1 edema to left leg and non pitt to right. Pt has had 2 bm this shift. Received 1 dose of percocet for pain.  Problem: Education: Goal: Knowledge of General Education information will improve Description: Including pain rating scale, medication(s)/side effects and non-pharmacologic comfort measures Outcome: Progressing   Problem: Health Behavior/Discharge Planning: Goal: Ability to manage health-related needs will improve Outcome: Progressing   Problem: Clinical Measurements: Goal: Ability to maintain clinical measurements within normal limits will improve Outcome: Progressing Goal: Will remain free from infection Outcome: Progressing Goal: Diagnostic test results will improve Outcome: Progressing Goal: Respiratory complications will improve Outcome: Progressing Goal: Cardiovascular complication will be avoided Outcome: Progressing   Problem: Activity: Goal: Risk for activity intolerance will decrease Outcome: Progressing   Problem: Nutrition: Goal: Adequate nutrition will be maintained Outcome: Progressing   Problem: Coping: Goal: Level of anxiety will decrease Outcome: Progressing   Problem: Elimination: Goal: Will not experience complications related to bowel motility Outcome: Progressing Goal: Will not experience complications related to urinary retention Outcome: Progressing   Problem: Pain Managment: Goal: General experience of comfort will improve Outcome: Progressing   Problem: Safety: Goal: Ability to remain free from injury will improve Outcome: Progressing   Problem: Skin Integrity: Goal: Risk for impaired skin integrity will decrease Outcome: Progressing

## 2021-11-14 NOTE — Progress Notes (Signed)
Patient ID: Danielle [name], female   DOB: January 20, 1956, 66 y.o.   MRN: [id number]     Danielle [name] Day(s): 22.   Interval History: Patient seen and examined, no acute events or new complaints overnight. Patient reports feeling okay while she is laying down but still getting fatigued when walking to the nurses station.  Tolerated diet without any issues.  Vital signs in last 24 hours: [min-max] current  Temp:  [98 F (36.7 C)-98.6 F (37 C)] 98.4 F (36.9 C) (01/31 1505) Pulse Rate:  [85-105] 90 (01/31 1506) Resp:  [20] 20 (01/31 0430) BP: (99-114)/(64-82) 100/80 (01/31 1505) SpO2:  [90 %-99 %] 92 % (01/31 1506) Weight:  [64.7 kg] 64.7 kg (01/31 0607)     Height: 5\' 6"  (167.6 cm) Weight: 64.7 kg BMI (Calculated): 23.03   Physical Exam:  Constitutional: alert, cooperative and no distress  Respiratory: breathing non-labored at rest  Cardiovascular: regular rate and sinus rhythm  Gastrointestinal: soft, non-tender, and non-distended  Labs:  CBC Latest Ref Rng & Units 11/13/2021 11/10/2021 11/09/2021  WBC 4.0 - 10.5 K/uL 7.4 8.4 8.4  Hemoglobin 12.0 - 15.0 g/dL 7.5(L) 7.8(L) 7.7(L)  Hematocrit 36.0 - 46.0 % 23.1(L) 24.8(L) 24.5(L)  Platelets 150 - 400 K/uL 330 294 276   CMP Latest Ref Rng & Units 11/12/2021 11/11/2021 11/10/2021  Glucose 70 - 99 mg/dL 122(H) 113(H) 134(H)  BUN 8 - 23 mg/dL 55(H) 62(H) 75(H)  Creatinine 0.44 - 1.00 mg/dL 2.25(H) 2.51(H) 2.71(H)  Sodium 135 - 145 mmol/L 135 136 135  Potassium 3.5 - 5.1 mmol/L 3.7 3.3(L) 3.8  Chloride 98 - 111 mmol/L 104 104 102  CO2 22 - 32 mmol/L 23 23 22   Calcium 8.9 - 10.3 mg/dL 8.3(L) 8.1(L) 8.6(L)  Total Protein 6.5 - 8.1 g/dL - - -  Total Bilirubin 0.3 - 1.2 mg/dL - - -  Alkaline Phos 38 - 126 U/L - - -  AST 15 - 41 U/L - - -  ALT 0 - 44 U/L - - -    Imaging studies: No new pertinent imaging studies   Assessment/Plan:  66 y.o. female with history of an ileostomy and mucous fistula 22 Day Post-Op s/p  ileostomy reversal, complicated by pertinent comorbidities including COPD, atrial fibrillation on anticoagulation, hypertension, chronic kidney disease stage III.   Status post ileostomy reversal -Patient initially with prolonged postoperative ileus that resolved. -Tolerating soft diet well -Passing gas and having bowel movements     Chronic kidney disease stage III -Acute kidney injury after surgery.   --Nephrology managing with diuretics.   Shortness of breath -Multifactorial due to consideration on right lung consolidation on last chest x-ray.  Also with small pleural effusion.   -Still on Unasyn for treatment right lung pneumonia -Currently on diuretics for treatment of pleural effusions -Had thoracentesis for treatment of pleural effusion as well -Requiring nasal cannula at 3.5L/min  -Continues to need oxygen at home as needed but baseline was 2 L/min -Appreciate pulmonology management recommendations -Appreciate nephrology and cardiology for management of current medical comorbidities   A. Fib -Intermittent rate control -Currently on therapeutic anticoagulation -Appreciate hospitalist and cardiologist evaluation and management   Encourage patient to get out of bed.  Will continue pain management.     Arnold Long, MD

## 2021-11-14 NOTE — Progress Notes (Signed)
[facility], [place] 11/14/21  Subjective:   LOS: 22 01/30 0701 - 01/31 0700 In: 970.2 [P.O.:560; I.V.:10; IV Piggyback:400.2] Out: 400 [Urine:400]  Patient resting in bed, states improved appetite Denies shortness of breath at rest, remains on 3.5 L [place]  Creatinine 2.25 Urine output recorded at 400 mL with 2 additional occurrences in previous 24 hours  Objective:  Vital signs in last 24 hours:  Temp:  [98 F (36.7 C)-98.6 F (37 C)] 98.6 F (37 C) (01/31 0801) Pulse Rate:  [85-93] 85 (01/31 0939) Resp:  [18-20] 20 (01/31 0430) BP: (99-114)/(64-82) 111/78 (01/31 0939) SpO2:  [90 %-99 %] 99 % (01/31 0939) Weight:  [64.7 kg] 64.7 kg (01/31 0607)  Weight change:  Filed Weights   11/10/21 0600 11/11/21 0400 11/14/21 0607  Weight: 65.1 kg 64 kg 64.7 kg    Intake/Output:    Intake/Output Summary (Last 24 hours) at 11/14/2021 1348 Last data filed at 11/14/2021 1335 Gross per 24 hour  Intake 970.22 ml  Output 700 ml  Net 270.22 ml      Physical Exam: General: NAD elderly female, laying in the bed  HEENT Moist oral mucosa  Pulm/lungs Diminished at bases, normal effort  CVS/Heart No rub or gallop  Abdomen:  Soft, bowel sounds present in right lower quadrant  Extremities: 1+ peripheral edema  Neurologic: Alert, able to answer questions appropriately  Skin: Warm, dry          Basic Metabolic Panel:  Recent Labs  Lab 11/08/21 0337 11/09/21 0430 11/10/21 1024 11/11/21 1159 11/12/21 1000  NA 136 134* 135 136 135  K 4.0 4.4 3.8 3.3* 3.7  CL 104 103 102 104 104  CO2 23 24 22 23 23   GLUCOSE 109* 108* 134* 113* 122*  BUN 60* 69* 75* 62* 55*  CREATININE 1.92* 2.21* 2.71* 2.51* 2.25*  CALCIUM 8.7* 8.4* 8.6* 8.1* 8.3*  MG  --  2.1  --   --   --   PHOS 4.4 5.0* 5.0* 4.0  --       CBC: Recent Labs  Lab 11/07/21 1633 11/08/21 0339 11/09/21 0911 11/10/21 1024 11/13/21 0445  WBC  --   --  8.4 8.4 7.4  HGB 7.9* 7.7* 7.7* 7.8*  7.5*  HCT 25.3*  --  24.5* 24.8* 23.1*  MCV  --   --  98.4 97.6 92.4  PLT  --   --  276 294 330      No results found for: HEPBSAG, HEPBSAB, HEPBIGM    Microbiology:  Recent Results (from the past 240 hour(s))  MRSA Next Gen by PCR, Nasal     Status: None   Collection Time: 11/05/21  5:56 AM   Specimen: Nasal Mucosa; Nasal Swab  Result Value Ref Range Status   MRSA by PCR Next Gen NOT DETECTED NOT DETECTED Final    Comment: (NOTE) The GeneXpert MRSA Assay (FDA approved for NASAL specimens only), is one component of a comprehensive MRSA colonization surveillance program. It is not intended to diagnose MRSA infection nor to guide or monitor treatment for MRSA infections. Test performance is not FDA approved in patients less than 58 years old. Performed at [facility], [address] [place], [place] [address]   CULTURE, BLOOD (ROUTINE X 2) w Reflex to ID Panel     Status: None   Collection Time: 11/05/21  8:09 AM   Specimen: BLOOD  Result Value Ref Range Status   Specimen Description BLOOD BLOOD RIGHT  WRIST  Final   Special Requests   Final    BOTTLES DRAWN AEROBIC ONLY Blood Culture adequate volume   Culture   Final    NO GROWTH 5 DAYS Performed at [facility], [address] [place], [place] [address]    Report Status 11/10/2021 FINAL  Final  CULTURE, BLOOD (ROUTINE X 2) w Reflex to ID Panel     Status: None   Collection Time: 11/05/21  8:09 AM   Specimen: BLOOD  Result Value Ref Range Status   Specimen Description BLOOD BLOOD RIGHT HAND  Final   Special Requests   Final    BOTTLES DRAWN AEROBIC AND ANAEROBIC Blood Culture adequate volume   Culture   Final    NO GROWTH 5 DAYS Performed at [facility], [address] [place], [place] [address]    Report Status 11/10/2021 FINAL  Final  Urine Culture     Status: None   Collection Time: 11/07/21  3:51 PM   Specimen: Urine, Clean Catch  Result Value Ref Range Status   Specimen  Description   Final    URINE, CLEAN CATCH Performed at [facility], [address] [place], [place] [address]    Special Requests   Final    NONE Performed at [facility], [address], [place], [place] [address]    Culture   Final    NO GROWTH Performed at [facility], [place] [address], [place], [place] [address]    Report Status 11/09/2021 FINAL  Final  Body fluid culture w Gram Stain     Status: None (Preliminary result)   Collection Time: 11/13/21 11:00 AM   Specimen: PATH Cytology Pleural fluid  Result Value Ref Range Status   Specimen Description   Final    PLEURAL Performed at [facility], [address], [place], [place] [address]    Special Requests   Final    NONE Performed at [facility], [address] [place],  [address]    Gram Stain   Final    WBC PRESENT,BOTH PMN AND MONONUCLEAR NO ORGANISMS SEEN CYTOSPIN SMEAR    Culture   Final    NO GROWTH < 24 HOURS Performed at [facility], [place] [address] [place],  [address]    Report Status PENDING  Incomplete    Coagulation Studies: No results for input(s): LABPROT, INR in the last 72 hours.  Urinalysis: No results for input(s): COLORURINE, LABSPEC, PHURINE, GLUCOSEU, HGBUR, BILIRUBINUR, KETONESUR, PROTEINUR, UROBILINOGEN, NITRITE, LEUKOCYTESUR in the last 72 hours.  Invalid input(s): APPERANCEUR     Imaging: DG Chest Port 1 View  Result Date: 11/14/2021 CLINICAL DATA:  Atelectasis, history of small-bowel obstruction EXAM: PORTABLE CHEST 1 VIEW COMPARISON:  11/13/2021 FINDINGS: No significant change in AP portable chest radiograph, with cardiomegaly and right upper extremity PICC. Diffuse bilateral interstitial pulmonary opacity, small bilateral pleural effusions, and bandlike scarring or atelectasis of the right midlung, unchanged. IMPRESSION: No significant change in AP portable chest radiograph, with cardiomegaly, probable edema, and  bilateral pleural effusions. No new airspace opacity. Electronically Signed   By: Delanna Ahmadi M.D.   On: 11/14/2021 09:58   DG Chest Port 1 View  Result Date: 11/13/2021 CLINICAL DATA:  Post right thoracentesis EXAM: PORTABLE CHEST 1 VIEW COMPARISON:  Chest x-ray dated November 12, 2021 FINDINGS: Cardiac and mediastinal contours are unchanged. Linear nodular opacity of the right upper lobe, unchanged from prior exam. No new parenchymal  opacity. Interval decreased size of small right pleural effusion. No evidence pneumothorax. IMPRESSION: No evidence of pneumothorax. Electronically Signed   By: Yetta Glassman M.D.   On: 11/13/2021 11:46   US THORACENTESIS ASP PLEURAL SPACE W/IMG GUIDE  Result Date: 11/13/2021 INDICATION: Right pleural effusion with shortness breath request received for diagnostic and therapeutic thoracentesis. EXAM: ULTRASOUND GUIDED RIGHT THORACENTESIS MEDICATIONS: Local 1% lidocaine only. COMPLICATIONS: None immediate. PROCEDURE: An ultrasound guided thoracentesis was thoroughly discussed with the patient and questions answered. The benefits, risks, alternatives and complications were also discussed. The patient understands and wishes to proceed with the procedure. Written consent was obtained. Ultrasound was performed to localize and mark an adequate pocket of fluid in the right chest. The area was then prepped and draped in the normal sterile fashion. 1% Lidocaine was used for local anesthesia. Under ultrasound guidance a 19 gauge, 7-cm, Yueh catheter was introduced. Thoracentesis was performed. The catheter was removed and a dressing applied. FINDINGS: A total of approximately 1 L of amber colored fluid was removed. Samples were sent to the laboratory as requested by the clinical team. IMPRESSION: Successful ultrasound guided right thoracentesis yielding 1 L of pleural fluid. This exam was performed by Tsosie Billing PA-C, and was supervised and interpreted by Dr. Denna Haggard.  Electronically Signed   By: Albin Felling M.D.   On: 11/13/2021 12:05     Medications:    ampicillin-sulbactam (UNASYN) IV 3 g (11/14/21 1101)    apixaban  2.5 mg Oral BID   Chlorhexidine Gluconate Cloth  6 each Topical Daily   citalopram  40 mg Oral BH-[id number]   diltiazem  240 mg Oral Daily   famotidine  20 mg Oral Daily   feeding supplement  1 Container Oral TID BM   fluticasone furoate-vilanterol  1 puff Inhalation Daily   And   umeclidinium bromide  1 puff Inhalation Daily   furosemide  40 mg Intravenous BID   gabapentin  300 mg Oral BID   multivitamin with minerals  1 tablet Oral Daily   pantoprazole  40 mg Oral QHS   sodium chloride flush  10-40 mL Intracatheter Q12H   Vitamin D (Ergocalciferol)  50,000 Units Oral Q7 days   albuterol, HYDROmorphone (DILAUDID) injection, lip balm, menthol-cetylpyridinium, metoprolol tartrate, ondansetron **OR** ondansetron (ZOFRAN) IV, oxyCODONE-acetaminophen, sodium chloride, sodium chloride flush  Assessment/ Plan:  66 y.o. 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  Coronary artery disease, atherosclerosis, aortic stenosis, chronic atrial fibrillation   Principal Problem:   Ileostomy status ([place]) Active Problems:   Hyponatremia   Acute respiratory failure with hypoxia (HCC)   Weakness   Acute kidney injury superimposed on CKD (HCC)   HTN (hypertension)   AF (paroxysmal atrial fibrillation) (HCC)   Normocytic anemia   Depression   Acute on chronic diastolic CHF (congestive heart failure) (HCC)   Hyperphosphatemia   Hypomagnesemia   Anemia of chronic disease   Ileus following gastrointestinal surgery (HCC)   Drop in hemoglobin   Fever   Aspiration pneumonia of right lower lobe due to gastric secretions  ([place])   #. 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 Baseline Creatnine 1.25 / GFR  on 10/30/2021 Recent Labs    11/09/21 0430 11/10/21 1024 11/11/21 1159 11/12/21 1000  CREATININE 2.21* 2.71* 2.51* 2.25*   AKI likely multifactorial Urinalysis from 1/22 shows large leukocytes, no  WBCs, suggesting ? interstitial nephritis.  We will still await urine culture. Blood pressure has also been low normal with most recent blood pressure 100/48.  Hypotension can contribute to worsening renal function. Currently getting Unasyn for possible right lung Aspiration pneumonia cxr 11/05/21 -Creatinine remained stable with urine output recorded.  We will continue to monitor urine output and lower extremity edema.  Continue diuresis at this time Will schedule follow up appt with our office at discharge  #. Anemia of CKD  Lab Results  Component Value Date   HGB 7.5 (L) 11/13/2021   Patient has received blood transfusions this admission. We will continue to monitor during this admission.  Hemoglobin below desired target.  Consider ESA as outpatient.  #. SHPTH     Component Value Date/Time   PTH 152 (H) 10/30/2019 0435   Lab Results  Component Value Date   PHOS 4.0 11/11/2021  Phosphorus and calcium remains within goal.   # S/p elective ileostomy reversal Hospital stay complicated by ileus Tolerating meals   LOS: [place] 1/31/20231:48 [place], [place]

## 2021-11-14 NOTE — Progress Notes (Signed)
Mobility Specialist - Progress Note   11/14/21 1200  Mobility  Activity Ambulated with assistance in hallway  Level of Assistance Standby assist, set-up cues, supervision of patient - no hands on  Assistive Device Front wheel walker  Distance Ambulated (ft) 70 ft  Activity Response Tolerated well  Transport method Ambulatory  $Mobility charge 1 Mobility    During mobility: 140-150s HR, 83% SpO2 Post-mobility: 101 HR, 95% SpO2   Pt lying in bed upon arrival, utilizing 3.5L. Pt ambulated in hallway with supervision. Still limited by HR and O2. HR peaked in 140-150s during ambulation, limiting further activity. O2 desat to 83% on 4L and was increased to 6L for recovery. SOB. Dizziness developed during ambulation. Pt wheeled back to room, assist on LE to return supine. Pt left in bed with alarm set, needs in reach. RN notified.    Kathee Delton Mobility Specialist 11/14/21, 12:36 PM

## 2021-11-14 NOTE — Progress Notes (Signed)
PULMONOLOGY         Date: 11/14/2021,   MRN# [id number] Danielle [name] 07/11/1956     AdmissionWeight: 56.2 kg                 CurrentWeight: 64.7 kg   Referring physician: Dr Peyton Najjar   CHIEF COMPLAINT:   Increased O2 requirement.   HISTORY OF PRESENT ILLNESS   This is a pleasant 66 yo with complex comorbid history including, COPD, AF, HTN, CKD3, s/p ileostomy reversal with persistent increase O2 req and notable pleural effusion. She is s/p thoracentesis yielding 1L fluid drained from right pleural space.  She is on supplemental O2 and feels improved.    11/14/21 - patient received VEST therapy yesterday with RT and felt that it may be helping. We have repeat CXR scheduled for today. She took few breaths on IS up to 624mL tidal volume. We discussed importance of PT/OT and she agrees to work with them for strength training. Yesterday she had big BM during my visit along with physical therapist. She remains on 3.5L/min and Ive discussed with RN to wean that down as able with goal spO2 >88%.  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   PAST MEDICAL HISTORY   Past Medical History:  Diagnosis Date   Anemia    Anxiety    Aortic atherosclerosis ([place])    Aortic valve stenosis 02/10/2018   a.) TTE 02/10/2018: EF 55-60%: mild AS with MPG of 12 mmHg. b.) TTE 04/21/2018: EF 35-40%; mild AS with MPG 8 mmHg. c.) TTE 11/07/2019: EF 50-55%; mild AS with MPG 10 mmHg.   Arthritis    Atrial fibrillation (HCC)    a.) CHA2DS2-VASc = 4 (age, sex, HTN, aortic plaque). b.) Rate/rhythm maintained on oral carvedilol; chronically anticoagulated on dose reduced apixaban. c.)  Attempted deployment of LAA occlusive device on 01/11/2021; parameters failed and procedure aborted.   Breast cancer, left ([place]) 2000   a.) T2N1M0; ER/PR (+) --> Tx'd with total mastectomy, LN resection, XRT, and  chemotherapy   Cancer of right lung ([place]) 07/30/2016   a.) adenocarcinoma; ALK, ROS1, PDL1, BRAF, EGFR all negative.   CKD (chronic kidney disease), stage IV (HCC)    COPD (chronic obstructive pulmonary disease) (HCC)    Dependence on supplemental oxygen    Depression    Diastolic dysfunction 81/27/5170   a.) TTE 02/10/2017: EF 55-60%; G2DD. b.) TTE 04/21/2018: EF 35-40%; mild LA dilation; mod MV regurgitation. c.) TTE 11/07/2019: EF 50-55%; G1DD.   DOE (dyspnea on exertion)    GERD (gastroesophageal reflux disease)    Heart murmur    History of 2019 novel coronavirus disease (COVID-19) 10/14/2019   HLD (hyperlipidemia)    Hypertension    Long term current use of anticoagulant    a.) apixaban   Lymphedema    Personal history of chemotherapy    Personal history of radiation therapy    SBO (small bowel obstruction) ([place]) 11/05/2020   Vitamin D deficiency      SURGICAL HISTORY   Past Surgical History:  Procedure Laterality Date   Breast Biospy Left    [facility]   BREAST SURGERY     COLONOSCOPY N/A 04/30/2018   Procedure: COLONOSCOPY;  Surgeon: Virgel Manifold, MD;  Location: [facility] ENDOSCOPY;  Service: Endoscopy;  Laterality: N/A;   COLONOSCOPY N/A 07/22/2018   Procedure: COLONOSCOPY;  Surgeon: Virgel Manifold, MD;  Location:  [facility] ENDOSCOPY;  Service: Endoscopy;  Laterality: N/A;   COLONOSCOPY WITH PROPOFOL N/A 09/21/2021   Procedure: COLONOSCOPY WITH PROPOFOL;  Surgeon: Sung Amabile, DO;  Location: [facility] ENDOSCOPY;  Service: General;  Laterality: N/A;   DILATION AND CURETTAGE OF UTERUS     ELECTROMAGNETIC NAVIGATION BROCHOSCOPY Right 04/11/2016   Procedure: ELECTROMAGNETIC NAVIGATION BRONCHOSCOPY;  Surgeon: Stephanie Acre, MD;  Location: [facility] ORS;  Service: Cardiopulmonary;  Laterality: Right;   ESOPHAGOGASTRODUODENOSCOPY N/A 07/22/2018   Procedure: ESOPHAGOGASTRODUODENOSCOPY (EGD);  Surgeon: Pasty Spillers, MD;  [facility] ENDOSCOPY;  Service: Endoscopy;   Laterality: N/A;   ESOPHAGOGASTRODUODENOSCOPY (EGD) WITH PROPOFOL N/A 05/07/2018   Procedure: ESOPHAGOGASTRODUODENOSCOPY (EGD) WITH PROPOFOL;  Surgeon: Midge Minium, MD;  Location: [facility] ENDOSCOPY;  Service: Endoscopy;  Laterality: N/A;   ESOPHAGOGASTRODUODENOSCOPY (EGD) WITH PROPOFOL N/A 04/24/2019   Procedure: ESOPHAGOGASTRODUODENOSCOPY (EGD) WITH PROPOFOL;  Surgeon: Wyline Mood, MD;  Location: [facility] ENDOSCOPY;  Service: Gastroenterology;  Laterality: N/A;   ESOPHAGOGASTRODUODENOSCOPY (EGD) WITH PROPOFOL N/A 01/12/2020   Procedure: ESOPHAGOGASTRODUODENOSCOPY (EGD) WITH PROPOFOL;  Surgeon: Wyline Mood, MD;  Location: [facility] ENDOSCOPY;  Service: Gastroenterology;  Laterality: N/A;   ESOPHAGOGASTRODUODENOSCOPY (EGD) WITH PROPOFOL N/A 04/28/2020   Procedure: ESOPHAGOGASTRODUODENOSCOPY (EGD) WITH PROPOFOL;  Surgeon: Wyline Mood, MD;  Location: [facility] ENDOSCOPY;  Service: Gastroenterology;  Laterality: N/A;   EUS N/A 05/07/2019   Procedure: FULL UPPER ENDOSCOPIC ULTRASOUND (EUS) RADIAL;  Surgeon: Rayann Heman, MD;  Location: [facility] ENDOSCOPY;  Service: Endoscopy;  Laterality: N/A;   ILEOSCOPY N/A 07/22/2018   Procedure: ILEOSCOPY THROUGH STOMA;  Surgeon: Pasty Spillers, MD;  Location: [facility] ENDOSCOPY;  Service: Endoscopy;  Laterality: N/A;   ILEOSTOMY     ILEOSTOMY N/A 09/08/2018   Procedure: ILEOSTOMY REVISION POSSIBLE CREATION;  Surgeon: Carolan Shiver, MD;  Location: [facility] ORS;  Service: General;  Laterality: N/A;   ILEOSTOMY CLOSURE N/A 08/15/2018   Procedure: DILATION OF ILEOSTOMY STRICTURE;  Surgeon: Carolan Shiver, MD;  Location: [facility] ORS;  Service: General;  Laterality: N/A;   LAPAROTOMY Right 05/04/2018   Procedure: EXPLORATORY LAPAROTOMY right colectomy right and left ostomy;  Surgeon: Carolan Shiver, MD;  Location: [facility] ORS;  Service: General;  Laterality: Right;   LEFT ATRIAL APPENDAGE OCCLUSION N/A 01/11/2021   Procedure: LEFT ATRIAL APPENDAGE OCCLUSION (WATCHMAN's  DEVICE); ABORTED PROCEDURE WITHOUT DEVICE BEING IMPLANTED; Location: [facility]; Surgeon: Gerre Pebbles, MD   LUNG BIOPSY     MASTECTOMY Left    2000, [facility]   ROTATOR CUFF REPAIR Right    [facility]   XI ROBOTIC ASSISTED COLOSTOMY TAKEDOWN N/A 10/23/2021   Procedure: XI ROBOTIC ASSISTED ILEOSTOMY TAKEDOWN;  Surgeon: Carolan Shiver, MD;  Location: [facility] ORS;  Service: General;  Laterality: N/A;  180 minutes for the surgery part please     FAMILY HISTORY   Family History  Problem Relation Age of Onset   Breast cancer Mother 68   Cancer Mother        Breast    Cirrhosis Father    Breast cancer Paternal Aunt 30   Cancer Maternal Aunt        Breast      SOCIAL HISTORY   Social History   Tobacco Use   Smoking status: Former    Packs/day: 0.50    Years: 20.00    Pack years: 10.00    Types: Cigarettes    Quit date: 07/02/2012    Years since quitting: 9.3   Smokeless tobacco: Current    Types: Snuff   Tobacco comments:    quit 2014  Vaping Use  Vaping Use: Never used  Substance Use Topics   Alcohol use: Yes    Comment: Occasionally beer   Drug use: No     MEDICATIONS    Home Medication:    Current Medication:  Current Facility-Administered Medications:    albuterol (PROVENTIL) (2.5 MG/3ML) 0.083% nebulizer solution 3 mL, 3 mL, Nebulization, Q6H PRN, [name]-Diaz, Edgardo, MD, 3 mL at 11/14/21 0737   Ampicillin-Sulbactam (UNASYN) 3 g in sodium chloride 0.9 % 100 mL IVPB, 3 g, Intravenous, Q12H, Jimmye Norman, Jamiese M, MD, Last Rate: 200 mL/hr at 11/13/21 2359, 3 g at 11/13/21 2359   Chlorhexidine Gluconate Cloth 2 % PADS 6 each, 6 each, Topical, Daily, Herbert Pun, MD, 6 each at 11/13/21 1000   citalopram (CELEXA) tablet 40 mg, 40 mg, Oral, [id number], [name]-Diaz, Reeves Forth, MD, 40 mg at 11/14/21 0605   diltiazem (CARDIZEM CD) 24 hr capsule 240 mg, 240 mg, Oral, Daily, Tang, Lily Michelle, PA-C, 240 mg at 11/13/21 0915   famotidine (PEPCID) tablet 20 mg, 20 mg, Oral,  Daily, [name]-Diaz, Edgardo, MD, 20 mg at 11/13/21 0915   feeding supplement (BOOST / RESOURCE BREEZE) liquid 1 Container, 1 Container, Oral, TID BM, Herbert Pun, MD, 1 Container at 11/13/21 2056   fluticasone furoate-vilanterol (BREO ELLIPTA) 100-25 MCG/ACT 1 puff, 1 puff, Inhalation, Daily, 1 puff at 11/13/21 0924 **AND** umeclidinium bromide (INCRUSE ELLIPTA) 62.5 MCG/ACT 1 puff, 1 puff, Inhalation, Daily, Benita Gutter, RPH, 1 puff at 11/13/21 0924   furosemide (LASIX) injection 40 mg, 40 mg, Intravenous, BID, Breeze, Shantelle, NP, 40 mg at 11/14/21 3086   gabapentin (NEURONTIN) capsule 300 mg, 300 mg, Oral, BID, [name]-Diaz, Edgardo, MD, 300 mg at 11/13/21 2056   HYDROmorphone (DILAUDID) injection 0.5 mg, 0.5 mg, Intravenous, Q2H PRN, Piscoya, Jose, MD, 0.5 mg at 11/09/21 1607   lip balm (BLISTEX) ointment, , Topical, PRN, Herbert Pun, MD, Given at 11/06/21 0905   menthol-cetylpyridinium (CEPACOL) lozenge 3 mg, 1 lozenge, Oral, PRN, Herbert Pun, MD, 3 mg at 11/07/21 0522   metoprolol tartrate (LOPRESSOR) injection 5 mg, 5 mg, Intravenous, Q6H PRN, Sharion Settler, NP, 5 mg at 10/31/21 1553   multivitamin with minerals tablet 1 tablet, 1 tablet, Oral, Daily, Herbert Pun, MD, 1 tablet at 11/13/21 1302   ondansetron (ZOFRAN-ODT) disintegrating tablet 4 mg, 4 mg, Oral, Q6H PRN, 4 mg at 11/07/21 1255 **OR** ondansetron (ZOFRAN) injection 4 mg, 4 mg, Intravenous, Q6H PRN, Herbert Pun, MD, 4 mg at 10/29/21 0051   oxyCODONE-acetaminophen (PERCOCET/ROXICET) 5-325 MG per tablet 1-2 tablet, 1-2 tablet, Oral, Q4H PRN, Herbert Pun, MD, 1 tablet at 11/14/21 0607   pantoprazole (PROTONIX) EC tablet 40 mg, 40 mg, Oral, QHS, Benita Gutter, RPH, 40 mg at 11/13/21 2056   sodium chloride (OCEAN) 0.65 % nasal spray 1 spray, 1 spray, Each Nare, PRN, Sharion Settler, NP, 1 spray at 11/12/21 1206   sodium chloride flush (NS) 0.9 % injection 10-40 mL,  10-40 mL, Intracatheter, Q12H, [name]-Diaz, Edgardo, MD, 10 mL at 11/13/21 2056   sodium chloride flush (NS) 0.9 % injection 10-40 mL, 10-40 mL, Intracatheter, PRN, Herbert Pun, MD   Vitamin D (Ergocalciferol) (DRISDOL) capsule 50,000 Units, 50,000 Units, Oral, Q7 days, Murlean Iba, MD, 50,000 Units at 11/13/21 1308    ALLERGIES   Patient has no known allergies.     REVIEW OF SYSTEMS    Review of Systems:  Gen:  Denies  fever, sweats, chills weigh loss  HEENT: Denies blurred vision, double vision, ear pain, eye pain, hearing  loss, nose bleeds, sore throat Cardiac:  No dizziness, chest pain or heaviness, chest tightness,edema Resp:   Denies cough or sputum porduction, shortness of breath,wheezing, hemoptysis,  Gi: Denies swallowing difficulty, stomach pain, nausea or vomiting, diarrhea, constipation, bowel incontinence Gu:  Denies bladder incontinence, burning urine Ext:   Denies Joint pain, stiffness or swelling Skin: Denies  skin rash, easy bruising or bleeding or hives Endoc:  Denies polyuria, polydipsia , polyphagia or weight change Psych:   Denies depression, insomnia or hallucinations   Other:  All other systems negative   VS: BP 99/82 (BP Location: Left Leg)    Pulse 86    Temp 98.6 F (37 C)    Resp 20    Ht $R'5\' 6"'ru$  (1.676 m)    Wt 64.7 kg    SpO2 90%    BMI 23.02 kg/m      PHYSICAL EXAM    GENERAL:NAD, no fevers, chills, no weakness no fatigue HEAD: Normocephalic, atraumatic.  EYES: Pupils equal, round, reactive to light. Extraocular muscles intact. No scleral icterus.  MOUTH: Moist mucosal membrane. Dentition intact. No abscess noted.  EAR, NOSE, THROAT: Clear without exudates. No external lesions.  NECK: Supple. No thyromegaly. No nodules. No JVD.  PULMONARY: mild rhochi worse on right CARDIOVASCULAR: S1 and S2. Regular rate and rhythm. No murmurs, rubs, or gallops. No edema. Pedal pulses 2+ bilaterally.  GASTROINTESTINAL: Soft, nontender,  nondistended. No masses. Positive bowel sounds. No hepatosplenomegaly.  MUSCULOSKELETAL: No swelling, clubbing, or edema. Range of motion full in all extremities.  NEUROLOGIC: Cranial nerves II through XII are intact. No gross focal neurological deficits. Sensation intact. Reflexes intact.  SKIN: No ulceration, lesions, rashes, or cyanosis. Skin warm and dry. Turgor intact.  PSYCHIATRIC: Mood, affect within normal limits. The patient is awake, alert and oriented x 3. Insight, judgment intact.       IMAGING    CT ABDOMEN PELVIS WO CONTRAST  Result Date: 11/05/2021 CLINICAL DATA:  Sepsis, fever, postoperative, rule out intra-abdominal abscess EXAM: CT ABDOMEN AND PELVIS WITHOUT CONTRAST TECHNIQUE: Multidetector CT imaging of the abdomen and pelvis was performed following the standard protocol without IV contrast. Oral enteric contrast was administered. RADIATION DOSE REDUCTION: This exam was performed according to the departmental dose-optimization program which includes automated exposure control, adjustment of the mA and/or kV according to patient size and/or use of iterative reconstruction technique. COMPARISON:  10/27/2021 FINDINGS: Lower chest: Small right pleural effusion associated atelectasis or consolidation. Three-vessel coronary artery calcifications. Hepatobiliary: No solid liver abnormality is seen. No gallstones, gallbladder wall thickening, or biliary dilatation. Pancreas: Unremarkable. No pancreatic ductal dilatation or surrounding inflammatory changes. Spleen: Normal in size without significant abnormality. Adrenals/Urinary Tract: Adrenal glands are unremarkable. Kidneys are normal, without renal calculi, solid lesion, or hydronephrosis. Bladder is unremarkable. Stomach/Bowel: Stomach is within normal limits. The mid small bowel is diffusely gas and contrast filled, and mildly distended, largest loops measuring 4.1 cm. Status post right hemicolectomy. Descending and sigmoid diverticula.  Vascular/Lymphatic: Aortic atherosclerosis. No enlarged abdominal or pelvic lymph nodes. Reproductive: No mass or other significant abnormality. Other: Anasarca. Persistent, although improved, scattered subcutaneous emphysema about the abdominal wall (series 2, image 38). Unchanged subcutaneous fluid collections of the left and right ventral abdomen, collection on the left measuring 3.4 x 2.2 cm (series 2, image 36), collection on the right measuring 3.5 x 2.0 cm (series 2, image 43). No ascites. Musculoskeletal: No acute or significant osseous findings. IMPRESSION: 1. Unchanged subcutaneous fluid collections of the left and right ventral  abdomen, collection on the left measuring 3.4 x 2.2 cm, collection on the right measuring 3.5 x 2.0 cm. These most likely reflect postoperative hematoma or seroma, although presence or absence of infection within this fluid is not established by CT. 2. Status post right hemicolectomy. 3. The mid small bowel is diffusely gas and contrast filled, and mildly distended, largest loops measuring 4.1 cm. Findings are most consistent with postoperative ileus. 4. Right pleural effusion and anasarca. 5. Coronary artery disease. Aortic Atherosclerosis (ICD10-I70.0). Electronically Signed   By: Delanna Ahmadi M.D.   On: 11/05/2021 17:34   CT ABDOMEN PELVIS WO CONTRAST  Result Date: 10/27/2021 CLINICAL DATA:  Postop day 4 from ileostomy reversal. Vomiting. Evaluate for anastomotic leak. EXAM: CT ABDOMEN AND PELVIS WITHOUT CONTRAST TECHNIQUE: Multidetector CT imaging of the abdomen and pelvis was performed following the standard protocol without IV contrast. RADIATION DOSE REDUCTION: This exam was performed according to the departmental dose-optimization program which includes automated exposure control, adjustment of the mA and/or kV according to patient size and/or use of iterative reconstruction technique. COMPARISON:  Abdominopelvic CT 11/05/2020.  Radiographs 10/26/2021. FINDINGS: Study  was performed with the patient in the right lateral decubitus position. Rectal contrast was administered. Lower chest: Apparent right middle lobe collapse with a small amount of adjacent pleural fluid, incompletely visualized. Mild dependent atelectasis in both lower lobes. There is atherosclerosis of the aorta and coronary arteries. Hepatobiliary: Stable low-density liver lesions, likely cysts based on stability. Stable mild biliary dilatation status post cholecystectomy. Pancreas: Unremarkable. No pancreatic ductal dilatation or surrounding inflammatory changes. Spleen: Normal in size without focal abnormality. Adrenals/Urinary Tract: Both adrenal glands appear normal. Both kidneys demonstrate mild perinephric soft tissue stranding, similar to previous study. No evidence of urinary tract calculus or hydronephrosis. The bladder appears unremarkable for its degree of distention. Stomach/Bowel: No oral contrast was administered. Rectal contrast has passed retrograde through the colon and through the ileocolonic anastomosis at the level of the mid transverse colon status post right hemicolectomy. There is retrograde filling of the distal small bowel. No anastomotic leak. The stomach and small bowel are fluid-filled and mildly distended. No evidence of bowel wall thickening or pneumatosis. Vascular/Lymphatic: There are no enlarged abdominal or pelvic lymph nodes. Diffuse aortic and branch vessel atherosclerosis. Reproductive: Stable appearance of the uterus and adnexa. No adnexal mass. Tubal ligation clips noted. Other: As seen on the postoperative radiographs, there is a large amount of soft tissue emphysema throughout the anterior abdominal wall which extends from the perineum into the presternal soft tissues. There are small focal fluid collections within the right and left anterior abdominal wall, likely related to recent laparoscopic procedure. No free intraperitoneal air, ascites or focal extraluminal fluid  collections identified within the peritoneal cavity. Musculoskeletal: No acute or significant osseous findings. Stable chronic mild superior endplate compression fractures at L3 and L5. IMPRESSION: 1. Status post recent ileostomy reversal and ileocolonic anastomosis. The anastomosis is patent, without leak. 2. Nonspecific gastric and proximal small bowel distension without focal transition point, likely related to a postoperative ileus. Recommend radiographic follow-up. Patient may benefit from a nasogastric tube. 3. As seen on postoperative radiographs, there is extensive soft tissue emphysema throughout the abdominal wall which is likely related to the laparoscopic procedure. Soft tissue infection should be excluded clinically. Small fluid collections in the anterior abdominal wall are attributed to the procedure, and no unexpected collections are identified. There is no pneumoperitoneum or abnormal intra-abdominal fluid collection. 4. Atelectasis at both lung bases with apparent  partial right middle lobe collapse. 5. Extensive Aortic Atherosclerosis (ICD10-I70.0). Electronically Signed   By: Carey Bullocks M.D.   On: 10/27/2021 16:28   DG Chest 1 View  Result Date: 10/26/2021 CLINICAL DATA:  Hypotension EXAM: CHEST  1 VIEW COMPARISON:  11/16/2020, 06/05/2020, PET CT 12/01/2019, CT chest 11/06/2019, 11/06/2019 FINDINGS: Mild cardiomegaly with central vascular congestion. Small pleural effusions and diffuse increased interstitial opacity likely due to edema. Aortic atherosclerosis. No pneumothorax. Irregular opacity with mild distortion and nodularity in the right upper lobe and right hilus similar compared to intermittent previous exams and presumably due to scarring. IMPRESSION: 1. Cardiomegaly with vascular congestion, small pleural effusions and mild diffuse interstitial edema 2. Irregular and nodular opacities in the right hilus and upper lobe present on previous exams and probably due to scarring  Electronically Signed   By: Jasmine Pang M.D.   On: 10/26/2021 17:54   DG Chest 2 View  Result Date: 11/09/2021 CLINICAL DATA:  Shortness of breath.  Mild chest pain.  Smoker. EXAM: CHEST - 2 VIEW COMPARISON:  11/05/2021 FINDINGS: Right PICC tip in the region of the inferior aspect of the superior vena cava without significant change. The nasogastric tube has been removed. Normal sized heart. Stable linear scarring in the right upper lobe. No significant change in a small right pleural effusion and right basilar airspace opacity. Clear left lung. Mildly tortuous and calcified thoracic aorta. No acute bony abnormality. IMPRESSION: Stable small right pleural effusion and right basilar atelectasis and/or pneumonia. Electronically Signed   By: Beckie Salts M.D.   On: 11/09/2021 10:17   DG Abd 1 View  Result Date: 11/03/2021 CLINICAL DATA:  NG tube placement EXAM: ABDOMEN - 1 VIEW COMPARISON:  11/03/2021 11:05 a.m. FINDINGS: Advancement of NG tube with tip and side port below the GE junction. Redemonstrated small bowel dilatation, not significantly changed from the prior exam. The stomach is not distended. The lower abdomen is not included for evaluation. IMPRESSION: Interval advancement of nasogastric tube, with tip and side port overlying the stomach. Overall unchanged small bowel dilatation in the imaged portion of the abdomen. Electronically Signed   By: Wiliam Ke M.D.   On: 11/03/2021 16:01   DG Abd 1 View  Result Date: 10/28/2021 CLINICAL DATA:  NG tube placement EXAM: ABDOMEN - 1 VIEW COMPARISON:  10/26/2021 FINDINGS: Limited radiograph of the lower chest and upper abdomen was obtained for the purposes of enteric tube localization. Enteric tube is seen coursing below the diaphragm with distal tipterminating within the proximal stomach. Side port terminates within the distal esophagus. Similar degree of gaseous distension of large and small bowel within the visualized abdomen. IMPRESSION: Enteric  tube with distal tip terminating within the proximal stomach, side port terminating within the distal esophagus. Recommend advancement 7-10 cm. Electronically Signed   By: Duanne Guess D.O.   On: 10/28/2021 15:48   DG Abd 1 View  Result Date: 10/26/2021 CLINICAL DATA:  Hypotension history of ileostomy take down EXAM: ABDOMEN - 1 VIEW COMPARISON:  11/05/2020 FINDINGS: Clips in the pelvis. Moderate air distension of the stomach. Air-filled dilated small bowel up to 3.5 cm in the central abdomen. Extensive mottled appearance of the abdominal soft tissues presumably due to soft tissue emphysema related to recent laparoscopic procedure. IMPRESSION: 1. Air distension of stomach and small bowel either due to ileus or possible obstruction. 2. Diffuse mottled appearance of soft tissues felt consistent with soft tissue emphysema likely due to recent laparoscopic procedure Electronically Signed  By: Donavan Foil M.D.   On: 10/26/2021 17:51   US RENAL  Result Date: 10/25/2021 CLINICAL DATA:  Acute kidney injury EXAM: RENAL / URINARY TRACT ULTRASOUND COMPLETE COMPARISON:  CT 11/05/2020 FINDINGS: Right Kidney: Renal measurements: 8.9 x 4.1 x 4.2 cm = volume: 79.8 mL. Cortex slightly echogenic. No mass or hydronephrosis Left Kidney: Renal measurements: 7.5 x 4.4 x 4 cm = volume: 88.5 mL. Cortex slightly echogenic. No mass or hydronephrosis. Bladder: Appears normal for degree of bladder distention. Other: None. IMPRESSION: Slight increased cortical echogenicity suggesting medical renal disease. No hydronephrosis. Electronically Signed   By: Donavan Foil M.D.   On: 10/25/2021 16:28   DG Chest Port 1 View  Result Date: 11/13/2021 CLINICAL DATA:  Post right thoracentesis EXAM: PORTABLE CHEST 1 VIEW COMPARISON:  Chest x-ray dated November 12, 2021 FINDINGS: Cardiac and mediastinal contours are unchanged. Linear nodular opacity of the right upper lobe, unchanged from prior exam. No new parenchymal opacity. Interval  decreased size of small right pleural effusion. No evidence pneumothorax. IMPRESSION: No evidence of pneumothorax. Electronically Signed   By: Yetta Glassman M.D.   On: 11/13/2021 11:46   DG Chest Port 1 View  Result Date: 11/12/2021 CLINICAL DATA:  Acute respiratory failure with hypoxia. EXAM: PORTABLE CHEST 1 VIEW COMPARISON:  November 09, 2021 FINDINGS: Right upper extremity PICC with tip near the superior cavoatrial junction. The heart size and mediastinal contours are within normal limits. Stable linear scarring in the right upper lobe. Slightly increased small right pleural effusion with adjacent airspace opacities. Increased conspicuity of the irregular nodular areas of consolidation in the right mid lung. The visualized skeletal structures are unremarkable. IMPRESSION: 1. Slightly increased small right pleural effusion with adjacent airspace opacities. 2. Increased conspicuity of the irregular nodular areas of consolidation in the right mid lung. Electronically Signed   By: Dahlia Bailiff M.D.   On: 11/12/2021 08:55   DG Chest Port 1 View  Result Date: 11/05/2021 CLINICAL DATA:  Fever. EXAM: PORTABLE CHEST 1 VIEW COMPARISON:  Chest radiograph 10/26/2021. FINDINGS: Right upper extremity PICC line tip projects over the superior vena cava. Enteric tube is present. The side port is at the level of the distal esophagus. Stable enlarged cardiac and mediastinal contours. Similar-appearing distortion and consolidation within the mid right lung. Bibasilar atelectasis, right-greater-than-left. Probable small right pleural effusion. No pneumothorax. IMPRESSION: Enteric tube side-port at the distal esophagus, recommend advancement. Right upper extremity PICC line tip projects over the superior vena cava. Similar-appearing irregular and nodular areas of consolidation within the right mid lung. These results will be called to the ordering clinician or representative by the Radiologist Assistant, and communication  documented in the PACS or [employer]. Electronically Signed   By: Lovey Newcomer M.D.   On: 11/05/2021 08:04   DG Abd 2 Views  Result Date: 11/06/2021 CLINICAL DATA:  Abdominal pain EXAM: ABDOMEN - 2 VIEW COMPARISON:  Abdominal x-ray 11/03/2021 FINDINGS: Enteric tube identified with the tip in the lower esophagus. Mildly distended loops of small bowel are again seen throughout the abdomen measuring up to 3.3 cm in diameter. No free air identified on the decubitus view. No suspicious calcifications identified. IMPRESSION: 1. Enteric tube tip is in the lower esophagus, advancement recommended. 2. Mildly distended loops of bowel throughout the abdomen which could be seen with ileus or obstruction. Continued follow-up recommended. Electronically Signed   By: Ofilia Neas M.D.   On: 11/06/2021 10:42   DG Abd 2 Views  Result Date:  11/03/2021 CLINICAL DATA:  Abdominal distention EXAM: ABDOMEN - 2 VIEW COMPARISON:  Previous studies including the examination of 11/02/2021 FINDINGS: There is interval worsening of small bowel dilation measuring up to 4.9 cm in diameter. Tip enteric tube is noted at the gastroesophageal junction pointing cephalad. There is no distention of stomach. There is interval passage of contrast from the colon. There is evidence of previous tubal ligation. IMPRESSION: There is interval increase in small bowel dilation suggesting high-grade small bowel obstruction. Tip of enteric tube is at the gastroesophageal junction pointing cephalad. NG tube should be advanced 5-10 cm to place the tip and side port within the stomach. Electronically Signed   By: Elmer Picker M.D.   On: 11/03/2021 12:36   DG Abd 2 Views  Result Date: 10/31/2021 CLINICAL DATA:  Ileus following gastrointestinal surgery, colostomy takedown 10/23/2021 EXAM: ABDOMEN - 2 VIEW COMPARISON:  10/28/2021 FINDINGS: Nasogastric tube in proximal stomach. Persistent dilatation of small bowel loops in the abdomen without  wall thickening. Small amount retained contrast in colon. Levoconvex lumbar scoliosis. Foci of soft tissue gas at RIGHT lateral abdominal wall consistent with preceding surgery. Atelectasis at RIGHT lung base. IMPRESSION: Persistent small bowel ileus. Electronically Signed   By: Lavonia Dana M.D.   On: 10/31/2021 10:59   DG Abd Portable 1V-Small Bowel Obstruction Protocol-initial, 8 hr delay  Result Date: 11/02/2021 CLINICAL DATA:  8 hour follow-up small-bowel delay EXAM: PORTABLE ABDOMEN - 1 VIEW COMPARISON:  Film from the previous day. FINDINGS: Gastric catheter is noted within the stomach. Contrast material administered now lies within the dilated small bowel as well as within the colon. These changes are consistent with a partial small bowel obstruction. No free air is noted. IMPRESSION: Passage of contrast into the colon consistent with a partial small bowel obstruction. Electronically Signed   By: Inez Catalina M.D.   On: 11/02/2021 03:13   DG Abd Portable 1V  Result Date: 11/01/2021 CLINICAL DATA:  Enteric catheter placement EXAM: PORTABLE ABDOMEN - 1 VIEW COMPARISON:  10/31/2021 FINDINGS: Frontal view of the lower chest and upper abdomen demonstrates enteric catheter tip and side port projecting over the gastric fundus. Right-sided PICC tip overlies atriocaval junction. Cardiac silhouette is enlarged. Small right pleural effusion. Stable vascular congestion. Stable distended gas-filled loops of small bowel, likely ileus given recent surgery. IMPRESSION: 1. Enteric catheter projecting over gastric fundus. 2. Stable small bowel dilatation consistent with ileus. Electronically Signed   By: Randa Ngo M.D.   On: 11/01/2021 16:02   ECHOCARDIOGRAM COMPLETE  Result Date: 10/27/2021    ECHOCARDIOGRAM REPORT   Patient Name:   Danielle [name] [facility] Date of Exam: 10/27/2021 Medical Rec #:  [id number]       Height:       66.0 in Accession #:    [id number]      Weight:       124.0 lb Date of Birth:  Feb 17, 1956        BSA:          31.632 m Patient Age:    70 years        BP:           144/84 mmHg Patient Gender: F               HR:           95 bpm. Exam Location:  [facility] Procedure: 2D Echo, Cardiac Doppler and Color Doppler Indications:     Elevated troponin  History:  Patient has prior history of Echocardiogram examinations, most                  recent 11/07/2019. COPD, Arrythmias:Atrial Fibrillation,                  Signs/Symptoms:Murmur; Risk Factors:Hypertension.  Sonographer:     Sherrie Sport Referring Phys:  [place] Diagnosing Phys: Donnelly Angelica IMPRESSIONS  1. Left ventricular ejection fraction, by estimation, is 60 to 65%. The left ventricle has normal function. The left ventricle has no regional wall motion abnormalities. Left ventricular diastolic parameters are consistent with Grade I diastolic dysfunction (impaired relaxation).  2. Right ventricular systolic function is normal. The right ventricular size is mildly enlarged.  3. Right atrial size was mildly dilated.  4. The mitral valve is normal in structure. Moderate mitral valve regurgitation. No evidence of mitral stenosis.  5. Tricuspid valve regurgitation is moderate.  6. The aortic valve is calcified. Aortic valve regurgitation is not visualized. Mild aortic valve stenosis.  7. The inferior vena cava is normal in size with greater than 50% respiratory variability, suggesting right atrial pressure of 3 mmHg. Conclusion(s)/Recommendation(s): CALCIFIED AORTIC VALVE WITH MILD AS. VMax 2.1 m/s. Dimensionless index 0.4. FINDINGS  Left Ventricle: Left ventricular ejection fraction, by estimation, is 60 to 65%. The left ventricle has normal function. The left ventricle has no regional wall motion abnormalities. The left ventricular internal cavity size was normal in size. There is  no left ventricular hypertrophy. Left ventricular diastolic parameters are consistent with Grade I diastolic dysfunction (impaired relaxation). Right Ventricle: The right  ventricular size is mildly enlarged. No increase in right ventricular wall thickness. Right ventricular systolic function is normal. Left Atrium: Left atrial size was normal in size. Right Atrium: Right atrial size was mildly dilated. Pericardium: There is no evidence of pericardial effusion. Mitral Valve: The mitral valve is normal in structure. Moderate mitral valve regurgitation. No evidence of mitral valve stenosis. MV peak gradient, 3.1 mmHg. The mean mitral valve gradient is 1.0 mmHg. Tricuspid Valve: The tricuspid valve is normal in structure. Tricuspid valve regurgitation is moderate . No evidence of tricuspid stenosis. Aortic Valve: The aortic valve is calcified. Aortic valve regurgitation is not visualized. Mild aortic stenosis is present. Aortic valve mean gradient measures 9.4 mmHg. Aortic valve peak gradient measures 16.8 mmHg. Aortic valve area, by VTI measures 1.21 cm. Pulmonic Valve: The pulmonic valve was not well visualized. Pulmonic valve regurgitation is not visualized. No evidence of pulmonic stenosis. Aorta: The aortic root is normal in size and structure. Venous: The inferior vena cava is normal in size with greater than 50% respiratory variability, suggesting right atrial pressure of 3 mmHg. IAS/Shunts: No atrial level shunt detected by color flow Doppler.  LEFT VENTRICLE PLAX 2D LVIDd:         4.80 cm   Diastology LVIDs:         3.00 cm   LV e' medial:    5.87 cm/s LV PW:         1.10 cm   LV E/e' medial:  13.9 LV IVS:        0.75 cm   LV e' lateral:   12.40 cm/s LVOT diam:     2.00 cm   LV E/e' lateral: 6.6 LV SV:         45 LV SV Index:   28 LVOT Area:     3.14 cm  RIGHT VENTRICLE RV Basal diam:  3.80 cm RV  S prime:     12.20 cm/s TAPSE (M-mode): 1.8 cm LEFT ATRIUM             Index        RIGHT ATRIUM           Index LA diam:        3.20 cm 1.96 cm/m   RA Area:     19.30 cm LA Vol (A2C):   58.8 ml 36.03 ml/m  RA Volume:   63.90 ml  39.15 ml/m LA Vol (A4C):   38.7 ml 23.71 ml/m LA  Biplane Vol: 49.1 ml 30.08 ml/m  AORTIC VALVE                     PULMONIC VALVE AV Area (Vmax):    1.04 cm      PV Vmax:        0.59 m/s AV Area (Vmean):   0.91 cm      PV Vmean:       35.200 cm/s AV Area (VTI):     1.21 cm      PV VTI:         0.109 m AV Vmax:           205.20 cm/s   PV Peak grad:   1.4 mmHg AV Vmean:          140.200 cm/s  PV Mean grad:   1.0 mmHg AV VTI:            0.373 m       RVOT Peak grad: 4 mmHg AV Peak Grad:      16.8 mmHg AV Mean Grad:      9.4 mmHg LVOT Vmax:         67.90 cm/s LVOT Vmean:        40.500 cm/s LVOT VTI:          0.144 m LVOT/AV VTI ratio: 0.39  AORTA Ao Root diam: 3.17 cm MITRAL VALVE               TRICUSPID VALVE MV Area (PHT): 4.86 cm    TR Peak grad:   52.4 mmHg MV Area VTI:   3.01 cm    TR Vmax:        362.00 cm/s MV Peak grad:  3.1 mmHg MV Mean grad:  1.0 mmHg    SHUNTS MV Vmax:       0.88 m/s    Systemic VTI:  0.14 m MV Vmean:      55.0 cm/s   Systemic Diam: 2.00 cm MV Decel Time: 156 msec    Pulmonic VTI:  0.157 m MV E velocity: 81.40 cm/s MV A velocity: 51.40 cm/s MV E/A ratio:  1.58 Donnelly Angelica Electronically signed by Donnelly Angelica Signature Date/Time: 10/27/2021/4:07:10 PM    Final    [place] EKG SITE RITE  Result Date: 11/01/2021 If Site Rite image not attached, placement could not be confirmed due to current cardiac rhythm.  US THORACENTESIS ASP PLEURAL SPACE W/IMG GUIDE  Result Date: 11/13/2021 INDICATION: Right pleural effusion with shortness breath request received for diagnostic and therapeutic thoracentesis. EXAM: ULTRASOUND GUIDED RIGHT THORACENTESIS MEDICATIONS: Local 1% lidocaine only. COMPLICATIONS: None immediate. PROCEDURE: An ultrasound guided thoracentesis was thoroughly discussed with the patient and questions answered. The benefits, risks, alternatives and complications were also discussed. The patient understands and wishes to proceed with the procedure. Written consent was obtained. Ultrasound was performed to localize and mark an  adequate pocket  of fluid in the right chest. The area was then prepped and draped in the normal sterile fashion. 1% Lidocaine was used for local anesthesia. Under ultrasound guidance a 19 gauge, 7-cm, Yueh catheter was introduced. Thoracentesis was performed. The catheter was removed and a dressing applied. FINDINGS: A total of approximately 1 L of amber colored fluid was removed. Samples were sent to the laboratory as requested by the clinical team. IMPRESSION: Successful ultrasound guided right thoracentesis yielding 1 L of pleural fluid. This exam was performed by Tsosie Billing PA-C, and was supervised and interpreted by Dr. Denna Haggard. Electronically Signed   By: Albin Felling M.D.   On: 11/13/2021 12:05      ASSESSMENT/PLAN   Acute on chronic hypoxemic respiratory failure   - Due to moderate right pleural effusion with associated right lower lobe complete atelectasis   -s/p thoracentesis - 1 L - aspirated -    Moderate pleural effusion   S/p aspiration - fluid pattern studies in process     Suspect transudative fluid due to CKD - Noted nephrology on case - appreciate input   Right lower lobe atelectasis   - patient would benefit from recruitment maneuvers    - will order RT Metaneb with albuterol BID    - Incentive spirometry and flutter valve PRN  CKD   - likely culprit with fluid overload contributing to pulmonary and gut edema   Atrial fibrillation - chronic - potential for rapid ventricular response due to albuterol/ipratropium therapy     Thank you for allowing me to participate in the care of this patient.  Total face to face encounter time for this patient visit was >45 min. >50% of the time was  spent in counseling and coordination of care.   Patient/Family are satisfied with care plan and all questions have been answered.  This document was prepared using Dragon voice recognition software and may include unintentional dictation errors.     Ottie Glazier, M.D.   Division of Pulmonary & [place]         .fa

## 2021-11-14 NOTE — Progress Notes (Signed)
PROGRESS NOTE    HPI was taken from Dr. Windell Moment: [name] is a 66 y.o.female patient who comes for reevaluation of her ileostomy status.  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 She had issues with ileostomy stricture and it was able to be revised locally. At that point she was very weak and not stable medically. For the last few year she has been getting stable with adequate blood pressure, hemoglobin and heart rate control. She is also very mobile and strong again. She has been able to tolerate diet.  For the last few months she has been complaining of increased mucus through the mucous fistula. She is also having adequate output through the ileostomy.  She was evaluated by cardiology 2 weeks ago. The assessment was that she was stable and was a very low risk for surgical management. Patient denies any chest pain or shortness of breath. She is able to do 4 METS without chest pain or shortness of breath.    As per Dr. Leslye Peer: Patient on surgical service.  66 year old female with past medical history of anemia, chronic atrial fibrillation, aortic valve stenosis, COPD, GERD, hyperlipidemia, hypertension, chronic kidney disease stage IIIb.  She was brought in electively for ileostomy takedown 15 days ago.  Has had postoperative ileus the entire hospital course.  Patient very slow to progress.  Currently on TPN.  Transfused 2 units of packed red blood cells for anemia during the hospital course.  Patient has had acute kidney injury.  Patient unable to come off the oxygen and with ambulation continues to desat.  Treating for aspiration pneumonia with a fever 2 days ago.   As per Dr. Jimmye Norman 1/25-1/31/23: Pt is on surgical service. Pt has had multiple days of shortness of breath. Repeat CXR on 11/12/21 which showed increased right pleural effusion. Pt is s/p thoracentesis on 11/13/21 that is predominantly transudate. Pleural fluid cx NGTD. Pleural fluid  cytology is pending. Pulmon is following as well   Danielle [name]  WYO:[id number] DOB: [birth year] DOA: 10/23/2021 PCP: [facility]    Assessment & Plan:   Principal Problem:   Ileostomy status ([place]) Active Problems:   Hyponatremia   Acute respiratory failure with hypoxia ([place])   Weakness   Acute kidney injury superimposed on CKD (HCC)   HTN (hypertension)   AF (paroxysmal atrial fibrillation) (HCC)   Normocytic anemia   Depression   Acute on chronic diastolic CHF (congestive heart failure) (HCC)   Hyperphosphatemia   Hypomagnesemia   Anemia of chronic disease   Ileus following gastrointestinal surgery (HCC)   Drop in hemoglobin   Fever   Aspiration pneumonia of right lower lobe due to gastric secretions (HCC)   Aspiration pneumonia: continue on IV unasyn, bronchodilators & encourage incentive spirometry. Pulmon following   ACD: H&H are labile. S/p 2 units of pRBCs transfused this admission. Will continue to monitor   PAF: continue on metoprolol, cardizem, & eliquis   Postoperative ileus: s/p ileostomy reversal. NG tube has been d/c. TPN has been d/c. Continue on soft diet as per gen surg   Acute hypoxic respiratory failure: pulse ox did drop down into the 80s on 3 L. Continue on supplemental oxygen and wean as tolerated. Has prn oxygen at home as per pt   Right pleural effusion: s/p 1L of amber colored removed w/ thoracentesis. Pleural cx NGTD. Cytology is pending. Pulmon following and recs apprec   AKI on CKDIIIb: Cr is labile. Avoid nephrotoxic meds  Weakness: PT recs HH   DVT prophylaxis: eliquis  Code Status: full  Family Communication:  Disposition Plan: as per general surg recs   Level of care: Telemetry Medical  Status is: Inpatient  Remains inpatient appropriate because: as per general surg   Consultants:  Hospitalist  General surg (primary)  Procedures:   s/p ileostomy reversal.  Antimicrobials: unasyn    Subjective: Pt c/o  fatigue   Objective: Vitals:   11/13/21 2122 11/14/21 0430 11/14/21 0607 11/14/21 0740  BP: 113/64 114/75    Pulse: 93 86    Resp: 20 20    Temp: 98 F (36.7 C) 98.2 F (36.8 C)    TempSrc: Oral Oral    SpO2: 97% 98%  96%  Weight:   64.7 kg   Height:        Intake/Output Summary (Last 24 hours) at 11/14/2021 0749 Last data filed at 11/14/2021 0500 Gross per 24 hour  Intake 970.22 ml  Output 400 ml  Net 570.22 ml   Filed Weights   11/10/21 0600 11/11/21 0400 11/14/21 0607  Weight: 65.1 kg 64 kg 64.7 kg    Examination:  General exam: Appears calm & comfortable    Respiratory system: clear breath sounds  Cardiovascular system: S1 & S2+. No rubs or gallops Gastrointestinal system: Abd is soft, NT, ND & normal bowel sounds Central nervous system: alert and oriented. Moves all extremities   Psychiatry: judgement and insight appears normal. Appropriate mood and affect   Data Reviewed: I have personally reviewed following labs and imaging studies  CBC: Recent Labs  Lab 11/07/21 1633 11/08/21 0339 11/09/21 0911 11/10/21 1024 11/13/21 0445  WBC  --   --  8.4 8.4 7.4  HGB 7.9* 7.7* 7.7* 7.8* 7.5*  HCT 25.3*  --  24.5* 24.8* 23.1*  MCV  --   --  98.4 97.6 92.4  PLT  --   --  276 294 836   Basic Metabolic Panel: Recent Labs  Lab 11/08/21 0337 11/09/21 0430 11/10/21 1024 11/11/21 1159 11/12/21 1000  NA 136 134* 135 136 135  K 4.0 4.4 3.8 3.3* 3.7  CL 104 103 102 104 104  CO2 23 24 22 23 23   GLUCOSE 109* 108* 134* 113* 122*  BUN 60* 69* 75* 62* 55*  CREATININE 1.92* 2.21* 2.71* 2.51* 2.25*  CALCIUM 8.7* 8.4* 8.6* 8.1* 8.3*  MG  --  2.1  --   --   --   PHOS 4.4 5.0* 5.0* 4.0  --    GFR: Estimated Creatinine Clearance: 22.7 mL/min (A) (by C-G formula based on SCr of 2.25 mg/dL (H)). Liver Function Tests: Recent Labs  Lab 11/08/21 0337 11/09/21 0430 11/10/21 1024 11/11/21 1159  AST  --  21  --   --   ALT  --  18  --   --   ALKPHOS  --  60  --   --    BILITOT  --  0.7  --   --   PROT  --  6.8  --   --   ALBUMIN 2.6* 2.7* 2.8* 2.6*   No results for input(s): LIPASE, AMYLASE in the last 168 hours.  No results for input(s): AMMONIA in the last 168 hours. Coagulation Profile: No results for input(s): INR, PROTIME in the last 168 hours. Cardiac Enzymes: No results for input(s): CKTOTAL, CKMB, CKMBINDEX, TROPONINI in the last 168 hours. BNP (last 3 results) No results for input(s): PROBNP in the last 8760 hours. HbA1C: No  results for input(s): HGBA1C in the last 72 hours. CBG: Recent Labs  Lab 11/08/21 1140 11/08/21 1800 11/08/21 2348 11/09/21 0609 11/09/21 1117  GLUCAP 127* 153* 121* 135* 127*   Lipid Profile: No results for input(s): CHOL, HDL, LDLCALC, TRIG, CHOLHDL, LDLDIRECT in the last 72 hours.  Thyroid Function Tests: No results for input(s): TSH, T4TOTAL, FREET4, T3FREE, THYROIDAB in the last 72 hours. Anemia Panel: No results for input(s): VITAMINB12, FOLATE, FERRITIN, TIBC, IRON, RETICCTPCT in the last 72 hours. Sepsis Labs: No results for input(s): PROCALCITON, LATICACIDVEN in the last 168 hours.   Recent Results (from the past 240 hour(s))  MRSA Next Gen by PCR, Nasal     Status: None   Collection Time: 11/05/21  5:56 AM   Specimen: Nasal Mucosa; Nasal Swab  Result Value Ref Range Status   MRSA by PCR Next Gen NOT DETECTED NOT DETECTED Final    Comment: (NOTE) The GeneXpert MRSA Assay (FDA approved for NASAL specimens only), is one component of a comprehensive MRSA colonization surveillance program. It is not intended to diagnose MRSA infection nor to guide or monitor treatment for MRSA infections. Test performance is not FDA approved in patients less than 33 years old. Performed at [facility], [address] [place], [place] [address]   CULTURE, BLOOD (ROUTINE X 2) w Reflex to ID Panel     Status: None   Collection Time: 11/05/21  8:09 AM   Specimen: BLOOD  Result Value Ref Range Status    Specimen Description BLOOD BLOOD RIGHT WRIST  Final   Special Requests   Final    BOTTLES DRAWN AEROBIC ONLY Blood Culture adequate volume   Culture   Final    NO GROWTH 5 DAYS Performed at [facility], [address] [place], [place] [address]    Report Status 11/10/2021 FINAL  Final  CULTURE, BLOOD (ROUTINE X 2) w Reflex to ID Panel     Status: None   Collection Time: 11/05/21  8:09 AM   Specimen: BLOOD  Result Value Ref Range Status   Specimen Description BLOOD BLOOD RIGHT HAND  Final   Special Requests   Final    BOTTLES DRAWN AEROBIC AND ANAEROBIC Blood Culture adequate volume   Culture   Final    NO GROWTH 5 DAYS Performed at [facility], [address], [place], [place] [address]    Report Status 11/10/2021 FINAL  Final  Urine Culture     Status: None   Collection Time: 11/07/21  3:51 PM   Specimen: Urine, Clean Catch  Result Value Ref Range Status   Specimen Description   Final    URINE, CLEAN CATCH Performed at [facility], [address], [place], [place] [address]    Special Requests   Final    NONE Performed at [facility], [address] [place], [place] [address]    Culture   Final    NO GROWTH Performed at [facility], [place] [address] [place], [place] [address]    Report Status 11/09/2021 FINAL  Final  Body fluid culture w Gram Stain     Status: None (Preliminary result)   Collection Time: 11/13/21 11:00 AM   Specimen: PATH Cytology Pleural fluid  Result Value Ref Range Status   Specimen Description   Final    PLEURAL Performed at [facility], [address] [place], [place] [address]    Special Requests   Final    NONE Performed  at [facility], [address] [place], [place] [address]    Gram Stain   Final    WBC PRESENT,BOTH PMN AND MONONUCLEAR NO ORGANISMS SEEN CYTOSPIN SMEAR Performed at [facility], [place] [address], [place], [place] [address]    Culture  PENDING  Incomplete   Report Status PENDING  Incomplete         Radiology Studies: DG Chest Port 1 View  Result Date: 11/13/2021 CLINICAL DATA:  Post right thoracentesis EXAM: PORTABLE CHEST 1 VIEW COMPARISON:  Chest x-ray dated November 12, 2021 FINDINGS: Cardiac and mediastinal contours are unchanged. Linear nodular opacity of the right upper lobe, unchanged from prior exam. No new parenchymal opacity. Interval decreased size of small right pleural effusion. No evidence pneumothorax. IMPRESSION: No evidence of pneumothorax. Electronically Signed   By: Yetta Glassman M.D.   On: 11/13/2021 11:46   US THORACENTESIS ASP PLEURAL SPACE W/IMG GUIDE  Result Date: 11/13/2021 INDICATION: Right pleural effusion with shortness breath request received for diagnostic and therapeutic thoracentesis. EXAM: ULTRASOUND GUIDED RIGHT THORACENTESIS MEDICATIONS: Local 1% lidocaine only. COMPLICATIONS: None immediate. PROCEDURE: An ultrasound guided thoracentesis was thoroughly discussed with the patient and questions answered. The benefits, risks, alternatives and complications were also discussed. The patient understands and wishes to proceed with the procedure. Written consent was obtained. Ultrasound was performed to localize and mark an adequate pocket of fluid in the right chest. The area was then prepped and draped in the normal sterile fashion. 1% Lidocaine was used for local anesthesia. Under ultrasound guidance a 19 gauge, 7-cm, Yueh catheter was introduced. Thoracentesis was performed. The catheter was removed and a dressing applied. FINDINGS: A total of approximately 1 L of amber colored fluid was removed. Samples were sent to the laboratory as requested by the clinical team. IMPRESSION: Successful ultrasound guided right thoracentesis yielding 1 L of pleural fluid. This exam was performed by Tsosie Billing PA-C, and was supervised and interpreted by Dr. Denna Haggard. Electronically Signed   By: Albin Felling M.D.    On: 11/13/2021 12:05        Scheduled Meds:  Chlorhexidine Gluconate Cloth  6 each Topical Daily   citalopram  40 mg Oral [id number]   diltiazem  240 mg Oral Daily   famotidine  20 mg Oral Daily   feeding supplement  1 Container Oral TID BM   fluticasone furoate-vilanterol  1 puff Inhalation Daily   And   umeclidinium bromide  1 puff Inhalation Daily   furosemide  40 mg Intravenous BID   gabapentin  300 mg Oral BID   multivitamin with minerals  1 tablet Oral Daily   pantoprazole  40 mg Oral QHS   sodium chloride flush  10-40 mL Intracatheter Q12H   Vitamin D (Ergocalciferol)  50,000 Units Oral Q7 days   Continuous Infusions:  ampicillin-sulbactam (UNASYN) IV 3 g (11/13/21 2359)     LOS: 22 days    Time spent: 15 mins     Wyvonnia Dusky, MD [facility] Pager [phone]  If 7PM-7AM, please contact night-coverage 11/14/2021, 7:49 AM

## 2021-11-15 DIAGNOSIS — I5043 Acute on chronic combined systolic (congestive) and diastolic (congestive) heart failure: Secondary | ICD-10-CM

## 2021-11-15 LAB — CBC
HCT: 24.8 % — ABNORMAL LOW (ref 36.0–46.0)
Hemoglobin: 8 g/dL — ABNORMAL LOW (ref 12.0–15.0)
MCH: 30.5 pg (ref 26.0–34.0)
MCHC: 32.3 g/dL (ref 30.0–36.0)
MCV: 94.7 fL (ref 80.0–100.0)
Platelets: 321 10*3/uL (ref 150–400)
RBC: 2.62 MIL/uL — ABNORMAL LOW (ref 3.87–5.11)
RDW: 16 % — ABNORMAL HIGH (ref 11.5–15.5)
WBC: 7.9 10*3/uL (ref 4.0–10.5)
nRBC: 0 % (ref 0.0–0.2)

## 2021-11-15 LAB — BASIC METABOLIC PANEL
Anion gap: 7 (ref 5–15)
BUN: 23 mg/dL (ref 8–23)
CO2: 30 mmol/L (ref 22–32)
Calcium: 8.5 mg/dL — ABNORMAL LOW (ref 8.9–10.3)
Chloride: 100 mmol/L (ref 98–111)
Creatinine, Ser: 1.78 mg/dL — ABNORMAL HIGH (ref 0.44–1.00)
GFR, Estimated: 31 mL/min — ABNORMAL LOW (ref >60)
Glucose, Bld: 120 mg/dL — ABNORMAL HIGH (ref 70–99)
Potassium: 3.2 mmol/L — ABNORMAL LOW (ref 3.5–5.1)
Sodium: 137 mmol/L (ref 135–145)

## 2021-11-15 MED ORDER — POTASSIUM CHLORIDE CRYS ER 20 MEQ PO TBCR
40.0000 meq | EXTENDED_RELEASE_TABLET | Freq: Two times a day (BID) | ORAL | Status: DC
  Administered 2021-11-15 – 2021-11-17 (×5): 40 meq via ORAL
  Filled 2021-11-15 (×5): qty 2

## 2021-11-15 NOTE — Assessment & Plan Note (Addendum)
Previous EF 35 to 40% in 2019, since recovered, 60-65% this admission with grade I DD. Diuresed with IV Lasix. Now on PO torsemide 100 mg BID. Monitor I's and daily weights. Suspect this contributed to her respiratory issues

## 2021-11-15 NOTE — Assessment & Plan Note (Signed)
Resolved

## 2021-11-15 NOTE — Assessment & Plan Note (Addendum)
Completed 9 days Unasyn, no further fever, leukocytosis.   -Continue Chest PT -- Follow pulmonology recommendations

## 2021-11-15 NOTE — Progress Notes (Signed)
PULMONOLOGY         Date: 11/15/2021,   MRN# [id number] Danielle [name] 1956/05/03     AdmissionWeight: 56.2 kg                 CurrentWeight: 63.2 kg   Referring physician: Dr Peyton Najjar   CHIEF COMPLAINT:   Increased O2 requirement.   HISTORY OF PRESENT ILLNESS   This is a pleasant 66 yo with complex comorbid history including, COPD, AF, HTN, CKD3, s/p ileostomy reversal with persistent increase O2 req and notable pleural effusion. She is s/p thoracentesis yielding 1L fluid drained from right pleural space.  She is on supplemental O2 and feels improved.    11/14/21 - patient received VEST therapy yesterday with RT and felt that it may be helping. We have repeat CXR scheduled for today. She took few breaths on IS up to 621m tidal volume. We discussed importance of PT/OT and she agrees to work with them for strength training. Yesterday she had big BM during my visit along with physical therapist. She remains on 3.5L/min and Ive discussed with RN to wean that down as able with goal spO2 >88%.  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   11/15/21- patient seen and examined at bedside. She feels almost unchanged from yesterday.  She did have well formed non bloody BM today, this caused dyspnea which is unusual for her. She still is with 3.5L+ fluid balance. Today her FiO2 is weaned to 3L with only small improvement from yesterday. She said urination is ok and her renal function is also improved. She had Vest therapy with RT today and uses IS well but not much improved from previous at 700cc tidal volumes. She does participate with PT and made it to second RN station then had transient desaturation with quick recovery. She is slowly improving with CKD/CHF and physical stress post GI surgery. Repeat Cxr ordered for AM.   PAST MEDICAL HISTORY   Past Medical History:  Diagnosis Date    Anemia    Anxiety    [place] atherosclerosis [place]    Aortic valve stenosis 02/10/2018   a.) TTE 02/10/2018: EF 55-60%: mild AS with MPG of 12 mmHg. b.) TTE 04/21/2018: EF 35-40%; mild AS with MPG 8 mmHg. c.) TTE 11/07/2019: EF 50-55%; mild AS with MPG 10 mmHg.   Arthritis    Atrial fibrillation (HCC)    a.) CHA2DS2-VASc = 4 (age, sex, HTN, aortic plaque). b.) Rate/rhythm maintained on oral carvedilol; chronically anticoagulated on dose reduced apixaban. c.)  Attempted deployment of LAA occlusive device on 01/11/2021; parameters failed and procedure aborted.   Breast cancer, left ([place] 2000   a.) T2N1M0; ER/PR (+) --> Tx'd with total mastectomy, LN resection, XRT, and chemotherapy   Cancer of right lung ([place] 07/30/2016   a.) adenocarcinoma; ALK, ROS1, PDL1, BRAF, EGFR all negative.   CKD (chronic kidney disease), stage IV (HCC)    COPD (chronic obstructive pulmonary disease) (HCC)    Dependence on supplemental oxygen    Depression    Diastolic dysfunction [id number]  a.) TTE 02/10/2017: EF 55-60%; G2DD. b.) TTE 04/21/2018: EF 35-40%; mild LA dilation; mod MV regurgitation. c.) TTE 11/07/2019: EF 50-55%; G1DD.   DOE (dyspnea on exertion)    GERD (gastroesophageal reflux disease)    Heart murmur    History of 2019 novel coronavirus disease (COVID-19) 10/14/2019   HLD (hyperlipidemia)  Hypertension    Long term current use of anticoagulant    a.) apixaban   Lymphedema    Personal history of chemotherapy    Personal history of radiation therapy    SBO (small bowel obstruction) ([place]) 11/05/2020   Vitamin D deficiency      SURGICAL HISTORY   Past Surgical History:  Procedure Laterality Date   Breast Biospy Left    [facility]   BREAST SURGERY     COLONOSCOPY N/A 04/30/2018   Procedure: COLONOSCOPY;  Surgeon: Virgel Manifold, MD;  Location: [facility] ENDOSCOPY;  Service: Endoscopy;  Laterality: N/A;   COLONOSCOPY N/A 07/22/2018   Procedure: COLONOSCOPY;  Surgeon: Virgel Manifold, MD;  Location: [facility] ENDOSCOPY;  Service: Endoscopy;  Laterality: N/A;   COLONOSCOPY WITH PROPOFOL N/A 09/21/2021   Procedure: COLONOSCOPY WITH PROPOFOL;  Surgeon: Benjamine Sprague, DO;  Location: [facility] ENDOSCOPY;  Service: General;  Laterality: N/A;   DILATION AND CURETTAGE OF UTERUS     ELECTROMAGNETIC NAVIGATION BROCHOSCOPY Right 04/11/2016   Procedure: ELECTROMAGNETIC NAVIGATION BRONCHOSCOPY;  Surgeon: Vilinda Boehringer, MD;  Location: [facility] ORS;  Service: Cardiopulmonary;  Laterality: Right;   ESOPHAGOGASTRODUODENOSCOPY N/A 07/22/2018   Procedure: ESOPHAGOGASTRODUODENOSCOPY (EGD);  Surgeon: Virgel Manifold, MD;  Location[facility] ENDOSCOPY;  Service: Endoscopy;  Laterality: N/A;   ESOPHAGOGASTRODUODENOSCOPY (EGD) WITH PROPOFOL N/A 05/07/2018   Procedure: ESOPHAGOGASTRODUODENOSCOPY (EGD) WITH PROPOFOL;  Surgeon: Lucilla Lame, MD;  Location[facility] ENDOSCOPY;  Service: Endoscopy;  Laterality: N/A;   ESOPHAGOGASTRODUODENOSCOPY (EGD) WITH PROPOFOL N/A 04/24/2019   Procedure: ESOPHAGOGASTRODUODENOSCOPY (EGD) WITH PROPOFOL;  Surgeon: Jonathon Bellows, MD;  Location[facility] ENDOSCOPY;  Service: Gastroenterology;  Laterality: N/A;   ESOPHAGOGASTRODUODENOSCOPY (EGD) WITH PROPOFOL N/A 01/12/2020   Procedure: ESOPHAGOGASTRODUODENOSCOPY (EGD) WITH PROPOFOL;  Surgeon: Jonathon Bellows, MD;  Location: [facility] ENDOSCOPY;  Service: Gastroenterology;  Laterality: N/A;   ESOPHAGOGASTRODUODENOSCOPY (EGD) WITH PROPOFOL N/A 04/28/2020   Procedure: ESOPHAGOGASTRODUODENOSCOPY (EGD) WITH PROPOFOL;  Surgeon: Jonathon Bellows, MD;  Location: [facility] ENDOSCOPY;  Service: Gastroenterology;  Laterality: N/A;   EUS N/A 05/07/2019   Procedure: FULL UPPER ENDOSCOPIC ULTRASOUND (EUS) RADIAL;  Surgeon: Jola Schmidt, MD;  Location: [facility] ENDOSCOPY;  Service: Endoscopy;  Laterality: N/A;   ILEOSCOPY N/A 07/22/2018   Procedure: ILEOSCOPY THROUGH STOMA;  Surgeon: Virgel Manifold, MD;  Location: [facility] ENDOSCOPY;  Service: Endoscopy;  Laterality: N/A;    ILEOSTOMY     ILEOSTOMY N/A 09/08/2018   Procedure: ILEOSTOMY REVISION POSSIBLE CREATION;  Surgeon: Herbert Pun, MD;  Location: [facility] ORS;  Service: General;  Laterality: N/A;   ILEOSTOMY CLOSURE N/A 08/15/2018   Procedure: DILATION OF ILEOSTOMY STRICTURE;  Surgeon: Herbert Pun, MD;  Location: [facility] ORS;  Service: General;  Laterality: N/A;   LAPAROTOMY Right 05/04/2018   Procedure: EXPLORATORY LAPAROTOMY right colectomy right and left ostomy;  Surgeon: Herbert Pun, MD;  Location: [facility] ORS;  Service: General;  Laterality: Right;   LEFT ATRIAL APPENDAGE OCCLUSION N/A 01/11/2021   Procedure: LEFT ATRIAL APPENDAGE OCCLUSION ([place]); ABORTED PROCEDURE WITHOUT DEVICE BEING IMPLANTED; Location: [facility]; Surgeon: Mylinda Latina, MD   LUNG BIOPSY     MASTECTOMY Left    2000, [place]   ROTATOR CUFF REPAIR Right    [place]   XI ROBOTIC ASSISTED COLOSTOMY TAKEDOWN N/A 10/23/2021   Procedure: XI ROBOTIC ASSISTED ILEOSTOMY TAKEDOWN;  Surgeon: Herbert Pun, MD;  Location: [facility] ORS;  Service: General;  Laterality: N/A;  180 minutes for the surgery part please     FAMILY HISTORY   Family History  Problem Relation Age of Onset  Breast cancer Mother 59   Cancer Mother        Breast    Cirrhosis Father    Breast cancer Paternal Aunt 6   Cancer Maternal Aunt        Breast      SOCIAL HISTORY   Social History   Tobacco Use   Smoking status: Former    Packs/day: 0.50    Years: 20.00    Pack years: 10.00    Types: Cigarettes    Quit date: 07/02/2012    Years since quitting: 9.3   Smokeless tobacco: Current    Types: Snuff   Tobacco comments:    quit 2014  Vaping Use   Vaping Use: Never used  Substance Use Topics   Alcohol use: Yes    Comment: Occasionally beer   Drug use: No     MEDICATIONS    Home Medication:    Current Medication:  Current Facility-Administered Medications:    albuterol (PROVENTIL) (2.5 MG/3ML) 0.083% nebulizer  solution 3 mL, 3 mL, Nebulization, Q6H PRN, Windell Moment, Edgardo, MD, 3 mL at 11/14/21 0737   alum & mag hydroxide-simeth (MAALOX/MYLANTA) 200-200-20 MG/5ML suspension 15-30 mL, 15-30 mL, Oral, Q4H PRN, Windell Moment, Edgardo, MD, 30 mL at 11/14/21 1500   apixaban (ELIQUIS) tablet 2.5 mg, 2.5 mg, Oral, BID, Dallie Piles, RPH, 2.5 mg at 11/15/21 1022   Chlorhexidine Gluconate Cloth 2 % PADS 6 each, 6 each, Topical, Daily, [name]-Diaz, Edgardo, MD, 6 each at 11/15/21 1023   citalopram (CELEXA) tablet 40 mg, 40 mg, Oral, BH-q7a, [name]-Diaz, Reeves Forth, MD, 40 mg at 11/15/21 0534   diltiazem (CARDIZEM CD) 24 hr capsule 240 mg, 240 mg, Oral, Daily, Tang, Lily Michelle, PA-C, 240 mg at 11/15/21 1021   famotidine (PEPCID) tablet 20 mg, 20 mg, Oral, Daily, [name]-Diaz, Edgardo, MD, 20 mg at 11/15/21 1021   feeding supplement (BOOST / RESOURCE BREEZE) liquid 1 Container, 1 Container, Oral, TID BM, Herbert Pun, MD, 1 Container at 11/13/21 2056   fluticasone furoate-vilanterol (BREO ELLIPTA) 100-25 MCG/ACT 1 puff, 1 puff, Inhalation, Daily, 1 puff at 11/15/21 1023 **AND** umeclidinium bromide (INCRUSE ELLIPTA) 62.5 MCG/ACT 1 puff, 1 puff, Inhalation, Daily, Benita Gutter, RPH, 1 puff at 11/15/21 1022   furosemide (LASIX) injection 40 mg, 40 mg, Intravenous, BID, Breeze, Shantelle, NP, 40 mg at 11/15/21 1730   gabapentin (NEURONTIN) capsule 300 mg, 300 mg, Oral, BID, [name]-Diaz, Edgardo, MD, 300 mg at 11/15/21 1022   HYDROmorphone (DILAUDID) injection 0.5 mg, 0.5 mg, Intravenous, Q2H PRN, Piscoya, Jose, MD, 0.5 mg at 11/09/21 1607   lip balm (BLISTEX) ointment, , Topical, PRN, Herbert Pun, MD, Given at 11/06/21 0905   menthol-cetylpyridinium (CEPACOL) lozenge 3 mg, 1 lozenge, Oral, PRN, Herbert Pun, MD, 3 mg at 11/07/21 0522   metoprolol tartrate (LOPRESSOR) injection 5 mg, 5 mg, Intravenous, Q6H PRN, Sharion Settler, NP, 5 mg at 10/31/21 1553   multivitamin with minerals  tablet 1 tablet, 1 tablet, Oral, Daily, [name]-Diaz, Reeves Forth, MD, 1 tablet at 11/15/21 1022   ondansetron (ZOFRAN-ODT) disintegrating tablet 4 mg, 4 mg, Oral, Q6H PRN, 4 mg at 11/07/21 1255 **OR** ondansetron (ZOFRAN) injection 4 mg, 4 mg, Intravenous, Q6H PRN, Windell Moment, Edgardo, MD, 4 mg at 10/29/21 0051   oxyCODONE-acetaminophen (PERCOCET/ROXICET) 5-325 MG per tablet 1-2 tablet, 1-2 tablet, Oral, Q4H PRN, Herbert Pun, MD, 2 tablet at 11/15/21 1022   pantoprazole (PROTONIX) EC tablet 40 mg, 40 mg, Oral, QHS, Benita Gutter, RPH, 40 mg at 11/14/21 2226  potassium chloride SA (KLOR-CON M) CR tablet 40 mEq, 40 mEq, Oral, BID, Danford, Suann Larry, MD, 40 mEq at 11/15/21 1730   sodium chloride (OCEAN) 0.65 % nasal spray 1 spray, 1 spray, Each Nare, PRN, Sharion Settler, NP, 1 spray at 11/12/21 1206   sodium chloride flush (NS) 0.9 % injection 10-40 mL, 10-40 mL, Intracatheter, Q12H, [name]-Diaz, Edgardo, MD, 10 mL at 11/15/21 1023   sodium chloride flush (NS) 0.9 % injection 10-40 mL, 10-40 mL, Intracatheter, PRN, Herbert Pun, MD   Vitamin D (Ergocalciferol) (DRISDOL) capsule 50,000 Units, 50,000 Units, Oral, Q7 days, Murlean Iba, MD, 50,000 Units at 11/13/21 1308    ALLERGIES   Patient has no known allergies.     REVIEW OF SYSTEMS    Review of Systems:  Gen:  Denies  fever, sweats, chills weigh loss  HEENT: Denies blurred vision, double vision, ear pain, eye pain, hearing loss, nose bleeds, sore throat Cardiac:  No dizziness, chest pain or heaviness, chest tightness,edema Resp:   Denies cough or sputum porduction, shortness of breath,wheezing, hemoptysis,  Gi: Denies swallowing difficulty, stomach pain, nausea or vomiting, diarrhea, constipation, bowel incontinence Gu:  Denies bladder incontinence, burning urine Ext:   Denies Joint pain, stiffness or swelling Skin: Denies  skin rash, easy bruising or bleeding or hives Endoc:  Denies polyuria,  polydipsia , polyphagia or weight change Psych:   Denies depression, insomnia or hallucinations   Other:  All other systems negative   VS: BP 104/78 (BP Location: Left Leg)    Pulse 85    Temp 98 F (36.7 C) (Oral)    Resp 18    Ht _0  (1.676 m)    Wt 63.2 kg    SpO2 95%    BMI 22.49 kg/m      PHYSICAL EXAM    GENERAL:NAD, no fevers, chills, no weakness no fatigue HEAD: Normocephalic, atraumatic.  EYES: Pupils equal, round, reactive to light. Extraocular muscles intact. No scleral icterus.  MOUTH: Moist mucosal membrane. Dentition intact. No abscess noted.  EAR, NOSE, THROAT: Clear without exudates. No external lesions.  NECK: Supple. No thyromegaly. No nodules. No JVD.  PULMONARY: mild rhochi worse on right CARDIOVASCULAR: S1 and S2. Regular rate and rhythm. No murmurs, rubs, or gallops. No edema. Pedal pulses 2+ bilaterally.  GASTROINTESTINAL: Soft, nontender, nondistended. No masses. Positive bowel sounds. No hepatosplenomegaly.  MUSCULOSKELETAL: No swelling, clubbing, or edema. Range of motion full in all extremities.  NEUROLOGIC: Cranial nerves II through XII are intact. No gross focal neurological deficits. Sensation intact. Reflexes intact.  SKIN: No ulceration, lesions, rashes, or cyanosis. Skin warm and dry. Turgor intact.  PSYCHIATRIC: Mood, affect within normal limits. The patient is awake, alert and oriented x 3. Insight, judgment intact.       IMAGING    CT ABDOMEN PELVIS WO CONTRAST  Result Date: 11/05/2021 CLINICAL DATA:  Sepsis, fever, postoperative, rule out intra-abdominal abscess EXAM: CT ABDOMEN AND PELVIS WITHOUT CONTRAST TECHNIQUE: Multidetector CT imaging of the abdomen and pelvis was performed following the standard protocol without IV contrast. Oral enteric contrast was administered. RADIATION DOSE REDUCTION: This exam was performed according to the departmental dose-optimization program which includes automated exposure control, adjustment of the mA  and/or kV according to patient size and/or use of iterative reconstruction technique. COMPARISON:  10/27/2021 FINDINGS: Lower chest: Small right pleural effusion associated atelectasis or consolidation. Three-vessel coronary artery calcifications. Hepatobiliary: No solid liver abnormality is seen. No gallstones, gallbladder wall thickening, or biliary dilatation. Pancreas: Unremarkable. No  pancreatic ductal dilatation or surrounding inflammatory changes. Spleen: Normal in size without significant abnormality. Adrenals/Urinary Tract: Adrenal glands are unremarkable. Kidneys are normal, without renal calculi, solid lesion, or hydronephrosis. Bladder is unremarkable. Stomach/Bowel: Stomach is within normal limits. The mid small bowel is diffusely gas and contrast filled, and mildly distended, largest loops measuring 4.1 cm. Status post right hemicolectomy. Descending and sigmoid diverticula. Vascular/Lymphatic: Aortic atherosclerosis. No enlarged abdominal or pelvic lymph nodes. Reproductive: No mass or other significant abnormality. Other: Anasarca. Persistent, although improved, scattered subcutaneous emphysema about the abdominal wall (series 2, image 38). Unchanged subcutaneous fluid collections of the left and right ventral abdomen, collection on the left measuring 3.4 x 2.2 cm (series 2, image 36), collection on the right measuring 3.5 x 2.0 cm (series 2, image 43). No ascites. Musculoskeletal: No acute or significant osseous findings. IMPRESSION: 1. Unchanged subcutaneous fluid collections of the left and right ventral abdomen, collection on the left measuring 3.4 x 2.2 cm, collection on the right measuring 3.5 x 2.0 cm. These most likely reflect postoperative hematoma or seroma, although presence or absence of infection within this fluid is not established by CT. 2. Status post right hemicolectomy. 3. The mid small bowel is diffusely gas and contrast filled, and mildly distended, largest loops measuring 4.1 cm.  Findings are most consistent with postoperative ileus. 4. Right pleural effusion and anasarca. 5. Coronary artery disease. Aortic Atherosclerosis (ICD10-I70.0). Electronically Signed   By: Delanna Ahmadi M.D.   On: 11/05/2021 17:34   CT ABDOMEN PELVIS WO CONTRAST  Result Date: 10/27/2021 CLINICAL DATA:  Postop day 4 from ileostomy reversal. Vomiting. Evaluate for anastomotic leak. EXAM: CT ABDOMEN AND PELVIS WITHOUT CONTRAST TECHNIQUE: Multidetector CT imaging of the abdomen and pelvis was performed following the standard protocol without IV contrast. RADIATION DOSE REDUCTION: This exam was performed according to the departmental dose-optimization program which includes automated exposure control, adjustment of the mA and/or kV according to patient size and/or use of iterative reconstruction technique. COMPARISON:  Abdominopelvic CT 11/05/2020.  Radiographs 10/26/2021. FINDINGS: Study was performed with the patient in the right lateral decubitus position. Rectal contrast was administered. Lower chest: Apparent right middle lobe collapse with a small amount of adjacent pleural fluid, incompletely visualized. Mild dependent atelectasis in both lower lobes. There is atherosclerosis of the aorta and coronary arteries. Hepatobiliary: Stable low-density liver lesions, likely cysts based on stability. Stable mild biliary dilatation status post cholecystectomy. Pancreas: Unremarkable. No pancreatic ductal dilatation or surrounding inflammatory changes. Spleen: Normal in size without focal abnormality. Adrenals/Urinary Tract: Both adrenal glands appear normal. Both kidneys demonstrate mild perinephric soft tissue stranding, similar to previous study. No evidence of urinary tract calculus or hydronephrosis. The bladder appears unremarkable for its degree of distention. Stomach/Bowel: No oral contrast was administered. Rectal contrast has passed retrograde through the colon and through the ileocolonic anastomosis at the  level of the mid transverse colon status post right hemicolectomy. There is retrograde filling of the distal small bowel. No anastomotic leak. The stomach and small bowel are fluid-filled and mildly distended. No evidence of bowel wall thickening or pneumatosis. Vascular/Lymphatic: There are no enlarged abdominal or pelvic lymph nodes. Diffuse aortic and branch vessel atherosclerosis. Reproductive: Stable appearance of the uterus and adnexa. No adnexal mass. Tubal ligation clips noted. Other: As seen on the postoperative radiographs, there is a large amount of soft tissue emphysema throughout the anterior abdominal wall which extends from the perineum into the presternal soft tissues. There are small focal fluid collections within the  right and left anterior abdominal wall, likely related to recent laparoscopic procedure. No free intraperitoneal air, ascites or focal extraluminal fluid collections identified within the peritoneal cavity. Musculoskeletal: No acute or significant osseous findings. Stable chronic mild superior endplate compression fractures at L3 and L5. IMPRESSION: 1. Status post recent ileostomy reversal and ileocolonic anastomosis. The anastomosis is patent, without leak. 2. Nonspecific gastric and proximal small bowel distension without focal transition point, likely related to a postoperative ileus. Recommend radiographic follow-up. Patient may benefit from a nasogastric tube. 3. As seen on postoperative radiographs, there is extensive soft tissue emphysema throughout the abdominal wall which is likely related to the laparoscopic procedure. Soft tissue infection should be excluded clinically. Small fluid collections in the anterior abdominal wall are attributed to the procedure, and no unexpected collections are identified. There is no pneumoperitoneum or abnormal intra-abdominal fluid collection. 4. Atelectasis at both lung bases with apparent partial right middle lobe collapse. 5. Extensive  Aortic Atherosclerosis (ICD10-I70.0). Electronically Signed   By: Richardean Sale M.D.   On: 10/27/2021 16:28   DG Chest 1 View  Result Date: 10/26/2021 CLINICAL DATA:  Hypotension EXAM: CHEST  1 VIEW COMPARISON:  11/16/2020, 06/05/2020, PET CT 12/01/2019, CT chest 11/06/2019, 11/06/2019 FINDINGS: Mild cardiomegaly with central vascular congestion. Small pleural effusions and diffuse increased interstitial opacity likely due to edema. Aortic atherosclerosis. No pneumothorax. Irregular opacity with mild distortion and nodularity in the right upper lobe and right hilus similar compared to intermittent previous exams and presumably due to scarring. IMPRESSION: 1. Cardiomegaly with vascular congestion, small pleural effusions and mild diffuse interstitial edema 2. Irregular and nodular opacities in the right hilus and upper lobe present on previous exams and probably due to scarring Electronically Signed   By: Donavan Foil M.D.   On: 10/26/2021 17:54   DG Chest 2 View  Result Date: 11/09/2021 CLINICAL DATA:  Shortness of breath.  Mild chest pain.  Smoker. EXAM: CHEST - 2 VIEW COMPARISON:  11/05/2021 FINDINGS: Right PICC tip in the region of the inferior aspect of the superior vena cava without significant change. The nasogastric tube has been removed. Normal sized heart. Stable linear scarring in the right upper lobe. No significant change in a small right pleural effusion and right basilar airspace opacity. Clear left lung. Mildly tortuous and calcified thoracic aorta. No acute bony abnormality. IMPRESSION: Stable small right pleural effusion and right basilar atelectasis and/or pneumonia. Electronically Signed   By: Claudie Revering M.D.   On: 11/09/2021 10:17   DG Abd 1 View  Result Date: 11/03/2021 CLINICAL DATA:  NG tube placement EXAM: ABDOMEN - 1 VIEW COMPARISON:  11/03/2021 11:05 a.m. FINDINGS: Advancement of NG tube with tip and side port below the GE junction. Redemonstrated small bowel dilatation,  not significantly changed from the prior exam. The stomach is not distended. The lower abdomen is not included for evaluation. IMPRESSION: Interval advancement of nasogastric tube, with tip and side port overlying the stomach. Overall unchanged small bowel dilatation in the imaged portion of the abdomen. Electronically Signed   By: Merilyn Baba M.D.   On: 11/03/2021 16:01   DG Abd 1 View  Result Date: 10/28/2021 CLINICAL DATA:  NG tube placement EXAM: ABDOMEN - 1 VIEW COMPARISON:  10/26/2021 FINDINGS: Limited radiograph of the lower chest and upper abdomen was obtained for the purposes of enteric tube localization. Enteric tube is seen coursing below the diaphragm with distal tipterminating within the proximal stomach. Side port terminates within the distal esophagus. Similar degree  of gaseous distension of large and small bowel within the visualized abdomen. IMPRESSION: Enteric tube with distal tip terminating within the proximal stomach, side port terminating within the distal esophagus. Recommend advancement 7-10 cm. Electronically Signed   By: Davina Poke D.O.   On: 10/28/2021 15:48   DG Abd 1 View  Result Date: 10/26/2021 CLINICAL DATA:  Hypotension history of ileostomy take down EXAM: ABDOMEN - 1 VIEW COMPARISON:  11/05/2020 FINDINGS: Clips in the pelvis. Moderate air distension of the stomach. Air-filled dilated small bowel up to 3.5 cm in the central abdomen. Extensive mottled appearance of the abdominal soft tissues presumably due to soft tissue emphysema related to recent laparoscopic procedure. IMPRESSION: 1. Air distension of stomach and small bowel either due to ileus or possible obstruction. 2. Diffuse mottled appearance of soft tissues felt consistent with soft tissue emphysema likely due to recent laparoscopic procedure Electronically Signed   By: Donavan Foil M.D.   On: 10/26/2021 17:51   US RENAL  Result Date: 10/25/2021 CLINICAL DATA:  Acute kidney injury EXAM: RENAL / URINARY  TRACT ULTRASOUND COMPLETE COMPARISON:  CT 11/05/2020 FINDINGS: Right Kidney: Renal measurements: 8.9 x 4.1 x 4.2 cm = volume: 79.8 mL. Cortex slightly echogenic. No mass or hydronephrosis Left Kidney: Renal measurements: 7.5 x 4.4 x 4 cm = volume: 88.5 mL. Cortex slightly echogenic. No mass or hydronephrosis. Bladder: Appears normal for degree of bladder distention. Other: None. IMPRESSION: Slight increased cortical echogenicity suggesting medical renal disease. No hydronephrosis. Electronically Signed   By: Donavan Foil M.D.   On: 10/25/2021 16:28   DG Chest Port 1 View  Result Date: 11/14/2021 CLINICAL DATA:  Atelectasis, history of small-bowel obstruction EXAM: PORTABLE CHEST 1 VIEW COMPARISON:  11/13/2021 FINDINGS: No significant change in AP portable chest radiograph, with cardiomegaly and right upper extremity PICC. Diffuse bilateral interstitial pulmonary opacity, small bilateral pleural effusions, and bandlike scarring or atelectasis of the right midlung, unchanged. IMPRESSION: No significant change in AP portable chest radiograph, with cardiomegaly, probable edema, and bilateral pleural effusions. No new airspace opacity. Electronically Signed   By: Delanna Ahmadi M.D.   On: 11/14/2021 09:58   DG Chest Port 1 View  Result Date: 11/13/2021 CLINICAL DATA:  Post right thoracentesis EXAM: PORTABLE CHEST 1 VIEW COMPARISON:  Chest x-ray dated November 12, 2021 FINDINGS: Cardiac and mediastinal contours are unchanged. Linear nodular opacity of the right upper lobe, unchanged from prior exam. No new parenchymal opacity. Interval decreased size of small right pleural effusion. No evidence pneumothorax. IMPRESSION: No evidence of pneumothorax. Electronically Signed   By: Yetta Glassman M.D.   On: 11/13/2021 11:46   DG Chest Port 1 View  Result Date: 11/12/2021 CLINICAL DATA:  Acute respiratory failure with hypoxia. EXAM: PORTABLE CHEST 1 VIEW COMPARISON:  November 09, 2021 FINDINGS: Right upper extremity  PICC with tip near the superior cavoatrial junction. The heart size and mediastinal contours are within normal limits. Stable linear scarring in the right upper lobe. Slightly increased small right pleural effusion with adjacent airspace opacities. Increased conspicuity of the irregular nodular areas of consolidation in the right mid lung. The visualized skeletal structures are unremarkable. IMPRESSION: 1. Slightly increased small right pleural effusion with adjacent airspace opacities. 2. Increased conspicuity of the irregular nodular areas of consolidation in the right mid lung. Electronically Signed   By: Dahlia Bailiff M.D.   On: 11/12/2021 08:55   DG Chest Port 1 View  Result Date: 11/05/2021 CLINICAL DATA:  Fever. EXAM: PORTABLE CHEST 1  VIEW COMPARISON:  Chest radiograph 10/26/2021. FINDINGS: Right upper extremity PICC line tip projects over the superior vena cava. Enteric tube is present. The side port is at the level of the distal esophagus. Stable enlarged cardiac and mediastinal contours. Similar-appearing distortion and consolidation within the mid right lung. Bibasilar atelectasis, right-greater-than-left. Probable small right pleural effusion. No pneumothorax. IMPRESSION: Enteric tube side-port at the distal esophagus, recommend advancement. Right upper extremity PICC line tip projects over the superior vena cava. Similar-appearing irregular and nodular areas of consolidation within the right mid lung. These results will be called to the ordering clinician or representative by the Radiologist Assistant, and communication documented in the PACS or [employer]. Electronically Signed   By: Lovey Newcomer M.D.   On: 11/05/2021 08:04   DG Abd 2 Views  Result Date: 11/06/2021 CLINICAL DATA:  Abdominal pain EXAM: ABDOMEN - 2 VIEW COMPARISON:  Abdominal x-ray 11/03/2021 FINDINGS: Enteric tube identified with the tip in the lower esophagus. Mildly distended loops of small bowel are again seen  throughout the abdomen measuring up to 3.3 cm in diameter. No free air identified on the decubitus view. No suspicious calcifications identified. IMPRESSION: 1. Enteric tube tip is in the lower esophagus, advancement recommended. 2. Mildly distended loops of bowel throughout the abdomen which could be seen with ileus or obstruction. Continued follow-up recommended. Electronically Signed   By: Ofilia Neas M.D.   On: 11/06/2021 10:42   DG Abd 2 Views  Result Date: 11/03/2021 CLINICAL DATA:  Abdominal distention EXAM: ABDOMEN - 2 VIEW COMPARISON:  Previous studies including the examination of 11/02/2021 FINDINGS: There is interval worsening of small bowel dilation measuring up to 4.9 cm in diameter. Tip enteric tube is noted at the gastroesophageal junction pointing cephalad. There is no distention of stomach. There is interval passage of contrast from the colon. There is evidence of previous tubal ligation. IMPRESSION: There is interval increase in small bowel dilation suggesting high-grade small bowel obstruction. Tip of enteric tube is at the gastroesophageal junction pointing cephalad. NG tube should be advanced 5-10 cm to place the tip and side port within the stomach. Electronically Signed   By: Elmer Picker M.D.   On: 11/03/2021 12:36   DG Abd 2 Views  Result Date: 10/31/2021 CLINICAL DATA:  Ileus following gastrointestinal surgery, colostomy takedown 10/23/2021 EXAM: ABDOMEN - 2 VIEW COMPARISON:  10/28/2021 FINDINGS: Nasogastric tube in proximal stomach. Persistent dilatation of small bowel loops in the abdomen without wall thickening. Small amount retained contrast in colon. Levoconvex lumbar scoliosis. Foci of soft tissue gas at RIGHT lateral abdominal wall consistent with preceding surgery. Atelectasis at RIGHT lung base. IMPRESSION: Persistent small bowel ileus. Electronically Signed   By: Lavonia Dana M.D.   On: 10/31/2021 10:59   DG Abd Portable 1V-Small Bowel Obstruction  Protocol-initial, 8 hr delay  Result Date: 11/02/2021 CLINICAL DATA:  8 hour follow-up small-bowel delay EXAM: PORTABLE ABDOMEN - 1 VIEW COMPARISON:  Film from the previous day. FINDINGS: Gastric catheter is noted within the stomach. Contrast material administered now lies within the dilated small bowel as well as within the colon. These changes are consistent with a partial small bowel obstruction. No free air is noted. IMPRESSION: Passage of contrast into the colon consistent with a partial small bowel obstruction. Electronically Signed   By: Inez Catalina M.D.   On: 11/02/2021 03:13   DG Abd Portable 1V  Result Date: 11/01/2021 CLINICAL DATA:  Enteric catheter placement EXAM: PORTABLE ABDOMEN - 1 VIEW COMPARISON:  10/31/2021 FINDINGS: Frontal view of the lower chest and upper abdomen demonstrates enteric catheter tip and side port projecting over the gastric fundus. Right-sided PICC tip overlies atriocaval junction. Cardiac silhouette is enlarged. Small right pleural effusion. Stable vascular congestion. Stable distended gas-filled loops of small bowel, likely ileus given recent surgery. IMPRESSION: 1. Enteric catheter projecting over gastric fundus. 2. Stable small bowel dilatation consistent with ileus. Electronically Signed   By: Randa Ngo M.D.   On: 11/01/2021 16:02   ECHOCARDIOGRAM COMPLETE  Result Date: 10/27/2021    ECHOCARDIOGRAM REPORT   Patient Name:   Danielle [name] Date of Exam: 10/27/2021 Medical Rec #:  [id number]       Height:       66.0 in Accession #:    [id number]      Weight:       124.0 lb Date of Birth:  08-May-1956       BSA:          26.632 m Patient Age:    73 years        BP:           144/84 mmHg Patient Gender: F               HR:           95 bpm. Exam Location:  [facility] Procedure: 2D Echo, Cardiac Doppler and Color Doppler Indications:     Elevated troponin  History:         Patient has prior history of Echocardiogram examinations, most                  recent 11/07/2019. COPD,  Arrythmias:Atrial Fibrillation,                  Signs/Symptoms:Murmur; Risk Factors:Hypertension.  Sonographer:     Sherrie Sport Referring Phys:  [place] Diagnosing Phys: Donnelly Angelica IMPRESSIONS  1. Left ventricular ejection fraction, by estimation, is 60 to 65%. The left ventricle has normal function. The left ventricle has no regional wall motion abnormalities. Left ventricular diastolic parameters are consistent with Grade I diastolic dysfunction (impaired relaxation).  2. Right ventricular systolic function is normal. The right ventricular size is mildly enlarged.  3. Right atrial size was mildly dilated.  4. The mitral valve is normal in structure. Moderate mitral valve regurgitation. No evidence of mitral stenosis.  5. Tricuspid valve regurgitation is moderate.  6. The aortic valve is calcified. Aortic valve regurgitation is not visualized. Mild aortic valve stenosis.  7. The inferior vena cava is normal in size with greater than 50% respiratory variability, suggesting right atrial pressure of 3 mmHg. Conclusion(s)/Recommendation(s): CALCIFIED AORTIC VALVE WITH MILD AS. VMax 2.1 m/s. Dimensionless index 0.4. FINDINGS  Left Ventricle: Left ventricular ejection fraction, by estimation, is 60 to 65%. The left ventricle has normal function. The left ventricle has no regional wall motion abnormalities. The left ventricular internal cavity size was normal in size. There is  no left ventricular hypertrophy. Left ventricular diastolic parameters are consistent with Grade I diastolic dysfunction (impaired relaxation). Right Ventricle: The right ventricular size is mildly enlarged. No increase in right ventricular wall thickness. Right ventricular systolic function is normal. Left Atrium: Left atrial size was normal in size. Right Atrium: Right atrial size was mildly dilated. Pericardium: There is no evidence of pericardial effusion. Mitral Valve: The mitral valve is normal in structure. Moderate mitral  valve regurgitation. No evidence of mitral valve stenosis. MV peak gradient, 3.1 mmHg.  The mean mitral valve gradient is 1.0 mmHg. Tricuspid Valve: The tricuspid valve is normal in structure. Tricuspid valve regurgitation is moderate . No evidence of tricuspid stenosis. Aortic Valve: The aortic valve is calcified. Aortic valve regurgitation is not visualized. Mild aortic stenosis is present. Aortic valve mean gradient measures 9.4 mmHg. Aortic valve peak gradient measures 16.8 mmHg. Aortic valve area, by VTI measures 1.21 cm. Pulmonic Valve: The pulmonic valve was not well visualized. Pulmonic valve regurgitation is not visualized. No evidence of pulmonic stenosis. Aorta: The aortic root is normal in size and structure. Venous: The inferior vena cava is normal in size with greater than 50% respiratory variability, suggesting right atrial pressure of 3 mmHg. IAS/Shunts: No atrial level shunt detected by color flow Doppler.  LEFT VENTRICLE PLAX 2D LVIDd:         4.80 cm   Diastology LVIDs:         3.00 cm   LV e' medial:    5.87 cm/s LV PW:         1.10 cm   LV E/e' medial:  13.9 LV IVS:        0.75 cm   LV e' lateral:   12.40 cm/s LVOT diam:     2.00 cm   LV E/e' lateral: 6.6 LV SV:         45 LV SV Index:   28 LVOT Area:     3.14 cm  RIGHT VENTRICLE RV Basal diam:  3.80 cm RV S prime:     12.20 cm/s TAPSE (M-mode): 1.8 cm LEFT ATRIUM             Index        RIGHT ATRIUM           Index LA diam:        3.20 cm 1.96 cm/m   RA Area:     19.30 cm LA Vol (A2C):   58.8 ml 36.03 ml/m  RA Volume:   63.90 ml  39.15 ml/m LA Vol (A4C):   38.7 ml 23.71 ml/m LA Biplane Vol: 49.1 ml 30.08 ml/m  AORTIC VALVE                     PULMONIC VALVE AV Area (Vmax):    1.04 cm      PV Vmax:        0.59 m/s AV Area (Vmean):   0.91 cm      PV Vmean:       35.200 cm/s AV Area (VTI):     1.21 cm      PV VTI:         0.109 m AV Vmax:           205.20 cm/s   PV Peak grad:   1.4 mmHg AV Vmean:          140.200 cm/s  PV Mean grad:    1.0 mmHg AV VTI:            0.373 m       RVOT Peak grad: 4 mmHg AV Peak Grad:      16.8 mmHg AV Mean Grad:      9.4 mmHg LVOT Vmax:         67.90 cm/s LVOT Vmean:        40.500 cm/s LVOT VTI:          0.144 m LVOT/AV VTI ratio: 0.39  AORTA Ao Root diam: 3.17 cm MITRAL VALVE  TRICUSPID VALVE MV Area (PHT): 4.86 cm    TR Peak grad:   52.4 mmHg MV Area VTI:   3.01 cm    TR Vmax:        362.00 cm/s MV Peak grad:  3.1 mmHg MV Mean grad:  1.0 mmHg    SHUNTS MV Vmax:       0.88 m/s    Systemic VTI:  0.14 m MV Vmean:      55.0 cm/s   Systemic Diam: 2.00 cm MV Decel Time: 156 msec    Pulmonic VTI:  0.157 m MV E velocity: 81.40 cm/s MV A velocity: 51.40 cm/s MV E/A ratio:  1.58 Donnelly Angelica Electronically signed by Donnelly Angelica Signature Date/Time: 10/27/2021/4:07:10 PM    Final    [place] EKG SITE RITE  Result Date: 11/01/2021 If Site Rite image not attached, placement could not be confirmed due to current cardiac rhythm.  US THORACENTESIS ASP PLEURAL SPACE W/IMG GUIDE  Result Date: 11/13/2021 INDICATION: Right pleural effusion with shortness breath request received for diagnostic and therapeutic thoracentesis. EXAM: ULTRASOUND GUIDED RIGHT THORACENTESIS MEDICATIONS: Local 1% lidocaine only. COMPLICATIONS: None immediate. PROCEDURE: An ultrasound guided thoracentesis was thoroughly discussed with the patient and questions answered. The benefits, risks, alternatives and complications were also discussed. The patient understands and wishes to proceed with the procedure. Written consent was obtained. Ultrasound was performed to localize and mark an adequate pocket of fluid in the right chest. The area was then prepped and draped in the normal sterile fashion. 1% Lidocaine was used for local anesthesia. Under ultrasound guidance a 19 gauge, 7-cm, Yueh catheter was introduced. Thoracentesis was performed. The catheter was removed and a dressing applied. FINDINGS: A total of approximately 1 L of amber colored fluid  was removed. Samples were sent to the laboratory as requested by the clinical team. IMPRESSION: Successful ultrasound guided right thoracentesis yielding 1 L of pleural fluid. This exam was performed by Tsosie Billing PA-C, and was supervised and interpreted by Dr. Denna Haggard. Electronically Signed   By: Albin Felling M.D.   On: 11/13/2021 12:05      ASSESSMENT/PLAN   Acute on chronic hypoxemic respiratory failure   - Due to moderate right pleural effusion with associated right lower lobe complete atelectasis   -s/p thoracentesis - 1 L - aspirated -    Moderate pleural effusion   S/p aspiration - fluid pattern studies in process     Suspect transudative fluid due to CKD - Noted nephrology on case - appreciate input   Right lower lobe atelectasis   - patient would benefit from recruitment maneuvers    - will order RT Metaneb with albuterol BID    - Incentive spirometry and flutter valve PRN  CKD   - likely culprit with fluid overload contributing to pulmonary and gut edema   Atrial fibrillation - chronic - potential for rapid ventricular response due to albuterol/ipratropium therapy     Thank you for allowing me to participate in the care of this patient.  Total face to face encounter time for this patient visit was >45 min. >50% of the time was  spent in counseling and coordination of care.   Patient/Family are satisfied with care plan and all questions have been answered.  This document was prepared using Dragon voice recognition software and may include unintentional dictation errors.     Ottie Glazier, M.D.  Division of Pulmonary & [place]         .  fa

## 2021-11-15 NOTE — Assessment & Plan Note (Signed)
Continue Celexa

## 2021-11-15 NOTE — Progress Notes (Signed)
Physical Therapy Treatment Patient Details Name: Danielle [name] MRN: [id number] DOB: 1956/04/13 Today's Date: 11/15/2021   History of Present Illness 66 y/o female s/p iliostomy take down 1/09.    PT Comments    Pt was long sitting in bed upon arriving. She is A and O x 4  but has flat affect. Pt on 3 L o2 upon arriving with sao2 >  88%. Quickly desaturates, with exertion, to 81% on 3 L. She was able to maintain > 90% on 5 L o2  but required frequent rest breaks. HR was in A fib throughout session ranging between 94bpm- 148 bpm. When asked about about [place] home, " I don't think I'm ready to go home yet." Per pt, she does have family present at home willing and able to assist at home at DC. Overall pt tolerated session well but is severely limited by fatigue. Recommend HHPT at DC to improve endurance and safety with ADLs.    Recommendations for follow up therapy are one component of a multi-disciplinary discharge planning process, led by the attending physician.  Recommendations may be updated based on patient status, additional functional criteria and insurance authorization.  Follow Up Recommendations  Home health PT     Assistance Recommended at Discharge Intermittent Supervision/Assistance  Patient can return home with the following A lot of help with walking and/or transfers;A lot of help with bathing/dressing/bathroom   Equipment Recommendations  Rolling walker (2 wheels)       Precautions / Restrictions Precautions Precautions: Fall Restrictions Weight Bearing Restrictions: No     Mobility  Bed Mobility Overal bed mobility: Needs Assistance Bed Mobility: Supine to Sit, Sit to Supine     Supine to sit: Modified independent (Device/Increase time), HOB elevated Sit to supine: Min assist General bed mobility comments: Min assist to progress BLEs into bed after OOB activity.    Transfers Overall transfer level: Needs assistance Equipment used: Rolling walker (2  wheels) Transfers: Sit to/from Stand Sit to Stand: Modified independent (Device/Increase time)   General transfer comment: pt stood without physical assistance. On 3 L at rest however required more O2 with exertion.    Ambulation/Gait Ambulation/Gait assistance: Min guard Gait Distance (Feet): 200 Feet Assistive device: Rolling walker (2 wheels) Gait Pattern/deviations: Step-through pattern Gait velocity: decreased     General Gait Details: pt was able to ambulate 200 ft with Rw however on 3 L destaurates to 81% with good pleth reading. Will need to closely monitor pleth reading fotr accurate reading due to poor pleth throughout ost on ambulation. sao2 > 90% on 5 L during exertion. HR in A fib mostly between 94bpm and 148bpm   Balance Overall balance assessment: Needs assistance Sitting-balance support: No upper extremity supported Sitting balance-Leahy Scale: Good     Standing balance support: Bilateral upper extremity supported Standing balance-Leahy Scale: Fair Standing balance comment: Reliant on the walker in both dynamic and static standing.  No LOBs but far from her baseline w/o AD       Cognition Arousal/Alertness: Awake/alert Behavior During Therapy: WFL for tasks assessed/performed Overall Cognitive Status: Within Functional Limits for tasks assessed      General Comments: Pt was agreeable to session and cooperative throughout. HR in A fib during gait training with sao2 > 90% on 5 L. On 3 L o2 quickly desaturates to low 80s.               Pertinent Vitals/Pain Pain Assessment Pain Assessment: No/denies pain Pain Score: 0-No pain  PT Goals (current goals can now be found in the care plan section) Acute Rehab PT Goals Patient Stated Goal: go home Progress towards PT goals: Progressing toward goals    Frequency    Min 2X/week      PT Plan Current plan remains appropriate       AM-PAC PT "6 Clicks" Mobility   Outcome Measure  Help needed  turning from your back to your side while in a flat bed without using bedrails?: None Help needed moving from lying on your back to sitting on the side of a flat bed without using bedrails?: None Help needed moving to and from a bed to a chair (including a wheelchair)?: A Little Help needed standing up from a chair using your arms (e.g., wheelchair or bedside chair)?: A Little Help needed to walk in hospital room?: A Little Help needed climbing 3-5 steps with a railing? : A Lot 6 Click Score: 19    End of Session Equipment Utilized During Treatment: Gait belt;Oxygen Activity Tolerance: Patient tolerated treatment well Patient left: in bed;with call bell/phone within reach;with bed alarm set Nurse Communication: Mobility status PT Visit Diagnosis: Muscle weakness (generalized) (M62.81);Difficulty in walking, not elsewhere classified (R26.2)     Time: 0938-1829 PT Time Calculation (min) (ACUTE ONLY): 24 min  Charges:  $Gait Training: 8-22 mins $Therapeutic Activity: 8-22 mins                    Julaine Fusi PTA 11/15/21, 12:30 PM

## 2021-11-15 NOTE — Assessment & Plan Note (Addendum)
Hgb up to 9.2 - stable and improved. No recent evidence of bleeding  -Continue Eliquis - Monitor Hgb

## 2021-11-15 NOTE — Progress Notes (Signed)
Progress Note   Patient: Danielle [name] DOB: Sep 07, 1956 DOA: 10/23/2021     23 DOS: the patient was seen and examined on 11/15/2021       Brief hospital course: Danielle [name] is a 66 y.o. F with cAF, AV stenosis, COPD, HTN, CKD IIIb, and anemia who presented for elective ileostomy takedown, complicated by post-operative ileus.  Hospitalist service consulted for medical management.  On TPN for a time, transfused 2 units PRBCs for anemia, developed AKI on CKD, now off TPN.   Developed SOB and found to have pleural effusion, s/p thoracentesis on 1/30, transudative.      Assessment and Plan: Aspiration pneumonia of right lower lobe due to gastric secretions (HCC) Completed 9 days Unasyn, no further fever, leukocytosis.    -Continue Chest PT  Anemia of chronic disease Hgb up to 8 today, no further bleeding -Continue Eliquis - Monitor Hgb  AF (paroxysmal atrial fibrillation) ([place])- (present on admission) - Supplement K -Continue eliquis -Continue diltiazem -Continue metoprolol  Acute on chronic combined systolic and diastolic CHF (congestive heart failure) (HCC) Previous EF 35 to 40% in 2019, since recovered, 60-65% this admission with grade I DD.  Now with transudative effusion.  Still edema on exam and crackles in lungs.  I think she is still fluid overloaded.    Unfortunately, no I/Os documented.  -Continue IV Lasix 40 BID - Start I/Os - Increase lasix as needed - Trend BMP - Start K supplement   Pleural effusion due to CHF (congestive heart failure) (HCC) Transfudative due to CHF flare  Acute respiratory failure with hypoxia (HCC) Due to CHF primarily at this point  Ileus following gastrointestinal surgery ([place]) Resolved  Acute kidney injury superimposed on CKD ([place])- (present on admission) Cr improving with diuresis the last few days  Hypokalemia- (present on admission) - Supplement potassium  Depression- (present on admission) - Continue  Celexa  HTN (hypertension)- (present on admission) Blood pressure controlled - Continue diltiazem, metoprolol - Hold amlodipine  Moderate protein calorie malnutrition As evidenced by moderate loss of subcutaneous muscle mass and failure in the setting of cancer and cancer related treatments.            Subjective: Patient is tired, still very out of breath, still tachycardic with ambulating just a short distance.  Still on 3 L oxygen.  No fever, sputum, confusion.  Physical Exam: Vitals:   11/15/21 0400 11/15/21 0412 11/15/21 0812 11/15/21 1528  BP:  102/77 106/79 104/78  Pulse:  93 93 85  Resp:  18 18   Temp: 98.7 F (37.1 C) 98.7 F (37.1 C) 98.5 F (36.9 C) 98 F (36.7 C)  TempSrc: Oral Oral  Oral  SpO2: 93% 90% 96% 95%  Weight: 63.2 kg     Height:       Adult female, appears chronically ill, lying in bed, no acute distress, appears tired. Heart rate regular breast, no murmurs, 1+ lower extremity edema, JVP elevated Lung sounds diminished at bilateral bases, worse on the right, crackles above that, poor air movement Abdomen soft without tenderness palpation or guarding, no ascites or distention Attention normal, affect appropriate, judgment insight.  Normal, moves upper extremities with generalized weakness but symmetric strength, gait shuffling.     Data Reviewed: Case discussed with general surgery Labs and imaging are notable for metabolic panel with potassium 3.2, creatinine improving. Hemogram notable for improving anemia, normal white blood cells.  Family Communication:    Disposition: Status is: Inpatient Remains inpatient appropriate because:  She will require ongoing treatment with IV Lasix.  I suspect that in 1 or 2 more days, she will be weaned down to either no oxygen or very little, and her symptoms with exertion will be improved to the point that she can be discharged.          Planned Discharge Destination: Home with Home  Health       Author: Edwin Dada, MD 11/15/2021 3:38 PM  For on call review [email or web address].

## 2021-11-15 NOTE — Progress Notes (Signed)
Patient ID: Danielle [name], female   DOB: 11-03-1955, 66 y.o.   MRN: [id number]     [facility] Day(s): 23.   Interval History: Patient seen and examined, no acute events or new complaints overnight. Patient reports feeling a little bit better today. She endorses that she felt a little bit more comfortable walking today without feeling dizzy. She still desat and increase her heart rate.   Vital signs in last 24 hours: [min-max] current  Temp:  [98 F (36.7 C)-98.7 F (37.1 C)] 98 F (36.7 C) (02/01 1528) Pulse Rate:  [85-94] 85 (02/01 1528) Resp:  [18] 18 (02/01 0812) BP: (102-135)/(77-107) 104/78 (02/01 1528) SpO2:  [90 %-96 %] 95 % (02/01 1528) Weight:  [63.2 kg] 63.2 kg (02/01 0400)     Height: 5\' 6"  (167.6 cm) Weight: 63.2 kg BMI (Calculated): 22.5   Physical Exam:  Constitutional: alert, cooperative and no distress  Respiratory: breathing non-labored at rest  Cardiovascular: regular rate and sinus rhythm  Gastrointestinal: soft, non-tender, and non-distended  Labs:  CBC Latest Ref Rng & Units 11/15/2021 11/13/2021 11/10/2021  WBC 4.0 - 10.5 K/uL 7.9 7.4 8.4  Hemoglobin 12.0 - 15.0 g/dL 8.0(L) 7.5(L) 7.8(L)  Hematocrit 36.0 - 46.0 % 24.8(L) 23.1(L) 24.8(L)  Platelets 150 - 400 K/uL 321 330 294   CMP Latest Ref Rng & Units 11/15/2021 11/12/2021 11/11/2021  Glucose 70 - 99 mg/dL 120(H) 122(H) 113(H)  BUN 8 - 23 mg/dL 23 55(H) 62(H)  Creatinine 0.44 - 1.00 mg/dL 1.78(H) 2.25(H) 2.51(H)  Sodium 135 - 145 mmol/L 137 135 136  Potassium 3.5 - 5.1 mmol/L 3.2(L) 3.7 3.3(L)  Chloride 98 - 111 mmol/L 100 104 104  CO2 22 - 32 mmol/L 30 23 23   Calcium 8.9 - 10.3 mg/dL 8.5(L) 8.3(L) 8.1(L)  Total Protein 6.5 - 8.1 g/dL - - -  Total Bilirubin 0.3 - 1.2 mg/dL - - -  Alkaline Phos 38 - 126 U/L - - -  AST 15 - 41 U/L - - -  ALT 0 - 44 U/L - - -    Imaging studies: No new pertinent imaging studies   Assessment/Plan:  66 y.o. female with history of an ileostomy and  mucous fistula 23 Day Post-Op s/p ileostomy reversal, complicated by pertinent comorbidities including COPD, atrial fibrillation on anticoagulation, hypertension, chronic kidney disease stage III.   Status post ileostomy reversal -Patient initially with prolonged postoperative ileus that resolved. -Tolerating soft diet well -Passing gas and having bowel movements -Stools are brown and not dark black as they use to be initially.      Chronic kidney disease stage III -Acute kidney injury after surgery.   --Nephrology managing with diuretics.   Shortness of breath -Multifactorial due to consideration on right lung consolidation on last chest x-ray.  Also with small pleural effusion.   -Completed Unasyn for treatment right lung pneumonia -Currently on diuretics for treatment of pleural effusions -Had thoracentesis for treatment of pleural effusion as well -Requiring nasal cannula at 3L/min  -Continues to need oxygen at home as needed but baseline was 2 L/min -Appreciate pulmonology management recommendations -Appreciate nephrology and cardiology for management of current medical comorbidities -Working daily with physical therapy    A. Fib -Intermittent rate control -Currently on therapeutic anticoagulation -Appreciate hospitalist and cardiologist evaluation and management   Encourage patient to get out of bed.  Will continue pain management.   Arnold Long, MD

## 2021-11-15 NOTE — Hospital Course (Addendum)
Brief hospital course: 66 year old woman with medical history significant for anemia, anxiety, aortic atherosclerosis, aortic stenosis, CKD stage IV, atrial fibrillation, breast cancer with history of chemotherapy and radiation therapy, COPD, depression, chronic diastolic and systolic CHF, GERD, hyperlipidemia, hypertension, lipidemia, small bowel obstruction, vitamin D deficiency.  She was admitted for elective ileostomy takedown.  Hospitalist service consulted for medical management.  Hospital course complicated by prolonged postoperative ileus, anemia requiring transfusion, aspiration pneumonia, AKI, acute on chronic systolic and diastolic CHF, large pleural effusion requiring thoracentesis.

## 2021-11-15 NOTE — Assessment & Plan Note (Addendum)
Renal function improved with IV hydration.

## 2021-11-15 NOTE — Assessment & Plan Note (Addendum)
Resolved with replacement. K today 3.9.  Monitor BMP.

## 2021-11-15 NOTE — Progress Notes (Signed)
[facility], [place] 11/15/21  Subjective:   LOS: 23 01/31 0701 - 02/01 0700 In: 440 [P.O.:240; IV Piggyback:200] Out: 300 [Urine:300]  Patient resting quietly, continues to tolerate meals without nausea and vomiting Complains of shortness of breath on exertion, remains on 3 L nasal cannula Lower extremity edema remains  Creatinine improved to 1.78 Minimal urine output recorded  Objective:  Vital signs in last 24 hours:  Temp:  [98.4 F (36.9 C)-98.7 F (37.1 C)] 98.5 F (36.9 C) (02/01 0812) Pulse Rate:  [90-105] 93 (02/01 0812) Resp:  [18] 18 (02/01 0812) BP: (100-135)/(77-107) 106/79 (02/01 0812) SpO2:  [90 %-96 %] 96 % (02/01 0812) Weight:  [63.2 kg] 63.2 kg (02/01 0400)  Weight change: -1.5 kg Filed Weights   11/11/21 0400 11/14/21 0607 11/15/21 0400  Weight: 64 kg 64.7 kg 63.2 kg    Intake/Output:    Intake/Output Summary (Last 24 hours) at 11/15/2021 1303 Last data filed at 11/15/2021 1051 Gross per 24 hour  Intake 440 ml  Output 300 ml  Net 140 ml      Physical Exam: General: NAD elderly female, laying in the bed  HEENT Moist oral mucosa  Pulm/lungs Diminished at bases, normal effort, [place] O2  CVS/Heart No rub or gallop  Abdomen:  Soft, bowel sounds present in right lower quadrant  Extremities: 1+ peripheral edema  Neurologic: Alert, able to answer questions appropriately  Skin: Warm, dry          Basic Metabolic Panel:  Recent Labs  Lab 11/09/21 0430 11/10/21 1024 11/11/21 1159 11/12/21 1000 11/15/21 1027  NA 134* 135 136 135 137  K 4.4 3.8 3.3* 3.7 3.2*  CL 103 102 104 104 100  CO2 24 22 23 23 30   GLUCOSE 108* 134* 113* 122* 120*  BUN 69* 75* 62* 55* 23  CREATININE 2.21* 2.71* 2.51* 2.25* 1.78*  CALCIUM 8.4* 8.6* 8.1* 8.3* 8.5*  MG 2.1  --   --   --   --   PHOS 5.0* 5.0* 4.0  --   --       CBC: Recent Labs  Lab 11/09/21 0911 11/10/21 1024 11/13/21 0445 11/15/21 1027  WBC 8.4 8.4 7.4 7.9  HGB  7.7* 7.8* 7.5* 8.0*  HCT 24.5* 24.8* 23.1* 24.8*  MCV 98.4 97.6 92.4 94.7  PLT 276 294 330 321      No results found for: HEPBSAG, HEPBSAB, HEPBIGM    Microbiology:  Recent Results (from the past 240 hour(s))  Urine Culture     Status: None   Collection Time: 11/07/21  3:51 PM   Specimen: Urine, Clean Catch  Result Value Ref Range Status   Specimen Description   Final    URINE, CLEAN CATCH Performed at [facility], [address], [place], [place] [address]    Special Requests   Final    NONE Performed at [facility], [address], [place], [place] [address]    Culture   Final    NO GROWTH Performed at [facility], [place] [address], [place], [place] [address]    Report Status 11/09/2021 FINAL  Final  Body fluid culture w Gram Stain     Status: None (Preliminary result)   Collection Time: 11/13/21 11:00 AM   Specimen: PATH Cytology Pleural fluid  Result Value Ref Range Status   Specimen Description   Final    PLEURAL Performed at [facility], [address], [place], [place] [address]  Special Requests   Final    NONE Performed at [facility], [place], [place] [address]    Gram Stain   Final    WBC PRESENT,BOTH PMN AND MONONUCLEAR NO ORGANISMS SEEN CYTOSPIN SMEAR    Culture   Final    NO GROWTH 2 DAYS Performed at [facility], [place] [address] [place], [place] [address]    Report Status PENDING  Incomplete    Coagulation Studies: No results for input(s): LABPROT, INR in the last 72 hours.  Urinalysis: No results for input(s): COLORURINE, LABSPEC, PHURINE, GLUCOSEU, HGBUR, BILIRUBINUR, KETONESUR, PROTEINUR, UROBILINOGEN, NITRITE, LEUKOCYTESUR in the last 72 hours.  Invalid input(s): APPERANCEUR     Imaging: DG Chest Port 1 View  Result Date: 11/14/2021 CLINICAL DATA:  Atelectasis, history of small-bowel obstruction EXAM: PORTABLE CHEST 1 VIEW COMPARISON:  11/13/2021 FINDINGS:  No significant change in AP portable chest radiograph, with cardiomegaly and right upper extremity PICC. Diffuse bilateral interstitial pulmonary opacity, small bilateral pleural effusions, and bandlike scarring or atelectasis of the right midlung, unchanged. IMPRESSION: No significant change in AP portable chest radiograph, with cardiomegaly, probable edema, and bilateral pleural effusions. No new airspace opacity. Electronically Signed   By: Delanna Ahmadi M.D.   On: 11/14/2021 09:58     Medications:      apixaban  2.5 mg Oral BID   Chlorhexidine Gluconate Cloth  6 each Topical Daily   citalopram  40 mg Oral BH-q7a   diltiazem  240 mg Oral Daily   famotidine  20 mg Oral Daily   feeding supplement  1 Container Oral TID BM   fluticasone furoate-vilanterol  1 puff Inhalation Daily   And   umeclidinium bromide  1 puff Inhalation Daily   furosemide  40 mg Intravenous BID   gabapentin  300 mg Oral BID   multivitamin with minerals  1 tablet Oral Daily   pantoprazole  40 mg Oral QHS   sodium chloride flush  10-40 mL Intracatheter Q12H   Vitamin D (Ergocalciferol)  50,000 Units Oral Q7 days   albuterol, alum & mag hydroxide-simeth, HYDROmorphone (DILAUDID) injection, lip balm, menthol-cetylpyridinium, metoprolol tartrate, ondansetron **OR** ondansetron (ZOFRAN) IV, oxyCODONE-acetaminophen, sodium chloride, sodium chloride flush  Assessment/ Plan:  66 y.o. 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  Coronary artery disease, atherosclerosis, aortic stenosis, chronic atrial fibrillation   Principal Problem:   Ileostomy status ([place]) Active Problems:   Hyponatremia   Acute respiratory failure with hypoxia (HCC)   Weakness   Acute kidney injury superimposed on CKD (HCC)   HTN  (hypertension)   AF (paroxysmal atrial fibrillation) (HCC)   Normocytic anemia   Depression   Pleural effusion due to CHF (congestive heart failure) (HCC)   Acute on chronic diastolic CHF (congestive heart failure) (HCC)   Hyperphosphatemia   Hypomagnesemia   Anemia of chronic disease   Ileus following gastrointestinal surgery (HCC)   Drop in hemoglobin   Fever   Aspiration pneumonia of right lower lobe due to gastric secretions ([place])   #. 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 Baseline Creatnine 1.25 / GFR  on 10/30/2021 Recent Labs    11/10/21 1024 11/11/21 1159 11/12/21 1000 11/15/21 1027  CREATININE 2.71* 2.51* 2.25* 1.78*   AKI likely multifactorial Urinalysis from 1/22 shows large leukocytes, no WBCs, suggesting ? interstitial nephritis.  We will still await urine culture. Blood pressure has also been low normal with most recent blood pressure 100/48.  Hypotension can contribute to worsening renal function. Currently getting Unasyn for possible right lung Aspiration pneumonia cxr 11/05/21 -Creatinine slightly improved.  We will continue to monitor with use of diuretic therapy.  Patient will require follow-up in our office at discharge.  #. Anemia of CKD  Lab Results  Component Value Date   HGB 8.0 (L) 11/15/2021   Patient has received blood transfusions this admission. We will continue to monitor during this admission. Consider ESA as outpatient.  #. SHPTH     Component Value Date/Time   PTH 152 (H) 10/30/2019 0435   Lab Results  Component Value Date   PHOS 4.0 11/11/2021  We will continue to monitor bone mineral metabolism during this admission   # S/p elective ileostomy reversal Hospital stay complicated by ileus Tolerating meals and moving bowels.   LOS: [address] 2/1/20231:03 PM  [place], [place]

## 2021-11-15 NOTE — Assessment & Plan Note (Addendum)
Blood pressure controlled, soft at times - Continue diltiazem, low dose metop, torsemide - Hold amlodipine -- on midodrine due to orthostasis

## 2021-11-15 NOTE — Assessment & Plan Note (Addendum)
Felt primarily due to decompensated CHF.  Pulmonology following, see their recommendations.  Supplement oxygen as needed to maintain sats above 88%.  At baseline, patient uses 2 L/min as needed

## 2021-11-15 NOTE — TOC Progression Note (Signed)
Transition of Care [facility]) - Progression Note    Patient Details  Name: Danielle [name] MRN: [id number] Date of Birth: 1956-06-16  Transition of Care [facility]) CM/SW Contact  Beverly Sessions, RN Phone Number: 11/15/2021, 12:40 PM  Clinical Narrative:    Per MD not medically ready for discharge due to respiratory status    Expected Discharge Plan: Home/Self Care Barriers to Discharge: Continued Medical Work up  Expected Discharge Plan and Services Expected Discharge Plan: Home/Self Care                                               Social Determinants of Health (SDOH) Interventions    Readmission Risk Interventions Readmission Risk Prevention Plan 10/16/2019 09/28/2019 06/20/2019  Transportation Screening Complete Complete Complete  Medication Review (RN Care Manager) Referral to Pharmacy Complete Complete  [place] or Home Care Consult Complete Patient refused Complete  SW Recovery Care/Counseling Consult Complete Complete -  Palliative Care Screening Not Applicable Not Applicable Not [place] Not Applicable Not Applicable -  Some recent data might be hidden

## 2021-11-15 NOTE — Assessment & Plan Note (Addendum)
Heart rate controlled.  Continue Eliquis, diltiazem, amiodarone, low dose metoprolol

## 2021-11-15 NOTE — Assessment & Plan Note (Addendum)
Transfudative due to CHF Decompensation

## 2021-11-16 ENCOUNTER — Inpatient Hospital Stay: Payer: Medicare Other

## 2021-11-16 LAB — BASIC METABOLIC PANEL
Anion gap: 7 (ref 5–15)
BUN: 22 mg/dL (ref 8–23)
CO2: 31 mmol/L (ref 22–32)
Calcium: 8.5 mg/dL — ABNORMAL LOW (ref 8.9–10.3)
Chloride: 103 mmol/L (ref 98–111)
Creatinine, Ser: 1.75 mg/dL — ABNORMAL HIGH (ref 0.44–1.00)
GFR, Estimated: 32 mL/min — ABNORMAL LOW (ref >60)
Glucose, Bld: 84 mg/dL (ref 70–99)
Potassium: 3.8 mmol/L (ref 3.5–5.1)
Sodium: 141 mmol/L (ref 135–145)

## 2021-11-16 LAB — CBC
HCT: 24.3 % — ABNORMAL LOW (ref 36.0–46.0)
Hemoglobin: 7.7 g/dL — ABNORMAL LOW (ref 12.0–15.0)
MCH: 30.3 pg (ref 26.0–34.0)
MCHC: 31.7 g/dL (ref 30.0–36.0)
MCV: 95.7 fL (ref 80.0–100.0)
Platelets: 361 10*3/uL (ref 150–400)
RBC: 2.54 MIL/uL — ABNORMAL LOW (ref 3.87–5.11)
RDW: 15.9 % — ABNORMAL HIGH (ref 11.5–15.5)
WBC: 6.8 10*3/uL (ref 4.0–10.5)
nRBC: 0 % (ref 0.0–0.2)

## 2021-11-16 LAB — BODY FLUID CULTURE W GRAM STAIN: Culture: NO GROWTH

## 2021-11-16 MED ORDER — IPRATROPIUM-ALBUTEROL 0.5-2.5 (3) MG/3ML IN SOLN
3.0000 mL | Freq: Four times a day (QID) | RESPIRATORY_TRACT | Status: DC
  Administered 2021-11-16: 3 mL via RESPIRATORY_TRACT
  Filled 2021-11-16: qty 3

## 2021-11-16 MED ORDER — FUROSEMIDE 10 MG/ML IJ SOLN
20.0000 mg | INTRAMUSCULAR | Status: AC
  Administered 2021-11-16: 20 mg via INTRAVENOUS
  Filled 2021-11-16: qty 4

## 2021-11-16 MED ORDER — EPOETIN ALFA 40000 UNIT/ML IJ SOLN
20000.0000 [IU] | Freq: Once | INTRAMUSCULAR | Status: AC
  Administered 2021-11-16: 20000 [IU] via SUBCUTANEOUS
  Filled 2021-11-16: qty 1

## 2021-11-16 NOTE — Care Management Important Message (Signed)
Important Message  Patient Details  Name: Danielle [name] MRN: [id number] Date of Birth: June 16, 1956   Medicare Important Message Given:  Yes     [name] 11/16/2021, 2:01 PM

## 2021-11-16 NOTE — Progress Notes (Signed)
PT Cancellation Note  Patient Details Name: SEPHIRA ZELLMAN MRN: [id number] DOB: [birth year]   Cancelled Treatment:    Reason Eval/Treat Not Completed:  (patient declined). Patient had just got back to bed from walking with mobility technician. I offered to review exercises and patient politely declined at this time. PT will continue with attempts as appropriate.   Minna Merritts, PT, MPT   Percell Locus 11/16/2021, 3:53 PM

## 2021-11-16 NOTE — Progress Notes (Signed)
[facility], [place] 11/16/21  Subjective:   LOS: 24 02/01 0701 - 02/02 0700 In: 718 [P.O.:718] Out: 250 [Urine:250]  Patient seen sitting up in bed, alert and oriented Tolerating meals without nausea and vomiting Continues to complain of shortness of breath with activity, notably transferring to and from bedside commode  Creatinine stable at 1.75 Lower extremity edema remains  Objective:  Vital signs in last 24 hours:  Temp:  [98 F (36.7 C)-98.8 F (37.1 C)] 98.2 F (36.8 C) (02/02 0802) Pulse Rate:  [85-91] 87 (02/02 0802) Resp:  [18-20] 18 (02/02 0802) BP: (104-136)/(69-88) 121/88 (02/02 0802) SpO2:  [94 %-98 %] 94 % (02/02 0802) Weight:  [61.8 kg] 61.8 kg (02/02 0500)  Weight change: -1.4 kg Filed Weights   11/14/21 0607 11/15/21 0400 11/16/21 0500  Weight: 64.7 kg 63.2 kg 61.8 kg    Intake/Output:    Intake/Output Summary (Last 24 hours) at 11/16/2021 1015 Last data filed at 11/16/2021 4193 Gross per 24 hour  Intake 718 ml  Output 250 ml  Net 468 ml      Physical Exam: General: NAD elderly female, laying in the bed  HEENT Moist oral mucosa  Pulm/lungs Diminished at bases, normal effort, [place] O2  CVS/Heart No rub or gallop  Abdomen:  Soft, bowel sounds present in right lower quadrant  Extremities: 1+ peripheral edema  Neurologic: Alert, able to answer questions appropriately  Skin: Warm, dry          Basic Metabolic Panel:  Recent Labs  Lab 11/10/21 1024 11/11/21 1159 11/12/21 1000 11/15/21 1027 11/16/21 0506  NA 135 136 135 137 141  K 3.8 3.3* 3.7 3.2* 3.8  CL 102 104 104 100 103  CO2 22 23 23 30 31   GLUCOSE 134* 113* 122* 120* 84  BUN 75* 62* 55* 23 22  CREATININE 2.71* 2.51* 2.25* 1.78* 1.75*  CALCIUM 8.6* 8.1* 8.3* 8.5* 8.5*  PHOS 5.0* 4.0  --   --   --       CBC: Recent Labs  Lab 11/10/21 1024 11/13/21 0445 11/15/21 1027 11/16/21 0506  WBC 8.4 7.4 7.9 6.8  HGB 7.8* 7.5* 8.0* 7.7*  HCT 24.8*  23.1* 24.8* 24.3*  MCV 97.6 92.4 94.7 95.7  PLT 294 330 321 361      No results found for: HEPBSAG, HEPBSAB, HEPBIGM    Microbiology:  Recent Results (from the past 240 hour(s))  Urine Culture     Status: None   Collection Time: 11/07/21  3:51 PM   Specimen: Urine, Clean Catch  Result Value Ref Range Status   Specimen Description   Final    URINE, CLEAN CATCH Performed at [facility], [address], [place], [place] [address]    Special Requests   Final    NONE Performed at [facility], [address] [place], [place] [address]    Culture   Final    NO GROWTH Performed at [facility], [place] [address] [place], [place] [address]    Report Status 11/09/2021 FINAL  Final  Body fluid culture w Gram Stain     Status: None (Preliminary result)   Collection Time: 11/13/21 11:00 AM   Specimen: PATH Cytology Pleural fluid  Result Value Ref Range Status   Specimen Description   Final    PLEURAL Performed at [facility], [address] [place], [place] [address]    Special Requests   Final    NONE Performed  at [facility], [place] [place], [place] [address]    Gram Stain   Final    WBC PRESENT,BOTH PMN AND MONONUCLEAR NO ORGANISMS SEEN CYTOSPIN SMEAR    Culture   Final    NO GROWTH 3 DAYS Performed at [facility], [place] [address], [place], [place] [address]    Report Status PENDING  Incomplete    Coagulation Studies: No results for input(s): LABPROT, INR in the last 72 hours.  Urinalysis: No results for input(s): COLORURINE, LABSPEC, PHURINE, GLUCOSEU, HGBUR, BILIRUBINUR, KETONESUR, PROTEINUR, UROBILINOGEN, NITRITE, LEUKOCYTESUR in the last 72 hours.  Invalid input(s): APPERANCEUR     Imaging: DG Chest Port 1 View  Result Date: 11/16/2021 CLINICAL DATA:  Shortness of breath EXAM: PORTABLE CHEST 1 VIEW COMPARISON:  Previous studies including the examination of 11/14/2021 FINDINGS: Transverse  diameter of heart is increased. Central pulmonary vessels are prominent. Linear densities in the right parahilar region have not changed significantly. Small bilateral pleural effusions are seen, more so on the right side. Crowding of markings in the medial lower lung fields suggest atelectasis/pneumonia. There is no pneumothorax. Tip of PICC line is seen in superior vena cava close to right atrium. IMPRESSION: Cardiomegaly. Central pulmonary vessels are prominent. Prominence of interstitial markings in the right parahilar region and both lower lung fields may suggest interstitial edema or pneumonia. Small bilateral pleural effusions, more so on the right side. Electronically Signed   By: Elmer Picker M.D.   On: 11/16/2021 08:13     Medications:      apixaban  2.5 mg Oral BID   Chlorhexidine Gluconate Cloth  6 each Topical Daily   citalopram  40 mg Oral BH-q7a   diltiazem  240 mg Oral Daily   famotidine  20 mg Oral Daily   feeding supplement  1 Container Oral TID BM   fluticasone furoate-vilanterol  1 puff Inhalation Daily   And   umeclidinium bromide  1 puff Inhalation Daily   furosemide  40 mg Intravenous BID   gabapentin  300 mg Oral BID   multivitamin with minerals  1 tablet Oral Daily   pantoprazole  40 mg Oral QHS   potassium chloride  40 mEq Oral BID   sodium chloride flush  10-40 mL Intracatheter Q12H   Vitamin D (Ergocalciferol)  50,000 Units Oral Q7 days   albuterol, alum & mag hydroxide-simeth, HYDROmorphone (DILAUDID) injection, lip balm, menthol-cetylpyridinium, metoprolol tartrate, ondansetron **OR** ondansetron (ZOFRAN) IV, oxyCODONE-acetaminophen, sodium chloride, sodium chloride flush  Assessment/ Plan:  66 y.o. female with COPD, chronic systolic CHF, hypertension, anemia, GERD, depression, history of breast cancer treated with mastectomy, chemo, radiation; lung cancer, history of colonic stricture status post hemi-colectomy, lower esophageal cancer 2020 ongoing  radiation.  Coronary artery disease, atherosclerosis, aortic stenosis, chronic atrial fibrillation   Principal Problem:   Ileostomy status ([place]) Active Problems:   Hyponatremia   Acute respiratory failure with hypoxia (HCC)   Weakness   Acute kidney injury superimposed on CKD (HCC)   HTN (hypertension)   AF (paroxysmal atrial fibrillation) (HCC)   Normocytic anemia   Depression   Pleural effusion due to CHF (congestive heart failure) (HCC)   Hypokalemia   Acute on chronic combined systolic and diastolic CHF (congestive heart failure) (HCC)   Hyperphosphatemia   Hypomagnesemia   Anemia of chronic disease   Ileus following gastrointestinal surgery ([place])   Aspiration pneumonia of right lower lobe due to gastric secretions ([place])   #. AKI on CKD st  3b. Baseline Creatnine 1.25 / GFR  on 10/30/2021 Recent Labs    11/11/21 1159 11/12/21 1000 11/15/21 1027 11/16/21 0506  CREATININE 2.51* 2.25* 1.78* 1.75*   AKI likely multifactorial Urinalysis from 1/22 shows large leukocytes, no WBCs, suggesting ? interstitial nephritis.    Hypotension can contribute to worsening renal function. -Creatinine appears stable, with urine output recorded -Continue diuresis as prescribed -We will continue to monitor -We will schedule follow-up appointment at discharge  #. Anemia of CKD  Lab Results  Component Value Date   HGB 7.7 (L) 11/16/2021   Patient has received blood transfusions this admission.  Hemoglobin remains below target, will consider ESA's as outpatient  #. SHPTH     Component Value Date/Time   PTH 152 (H) 10/30/2019 0435   Lab Results  Component Value Date   PHOS 4.0 11/11/2021  Calcium and phosphorus within desired goal   # S/p elective ileostomy reversal Hospital stay complicated by ileus Tolerating meals and moving bowels.   LOS: [address] 2/2/202310:15 AM  [address] [place], [place]

## 2021-11-16 NOTE — Plan of Care (Signed)
°  Problem: Clinical Measurements: Goal: Ability to maintain clinical measurements within normal limits will improve Outcome: Progressing Goal: Will remain free from infection Outcome: Progressing Goal: Diagnostic test results will improve Outcome: Progressing Goal: Respiratory complications will improve Outcome: Progressing Goal: Cardiovascular complication will be avoided Outcome: Progressing   Problem: Pain Managment: Goal: General experience of comfort will improve Outcome: Progressing   Pt is involved in and agrees with the plan of care. V/S stable. Oxycodone given for pain on her abdomen. Kept on oxygen at 3lpm/[place] with sats at 98%. Dyspnea on exertion noted, desats to 80s% but recovers quickly when settled.

## 2021-11-16 NOTE — Progress Notes (Signed)
PROGRESS NOTE  Danielle [name]:[id number] DOB: 11-30-1955 DOA: 10/23/2021 PCP: [facility]  HPI/Recap of past 24 hours: Mrs. Dingus is a 66 y.o. F with cAF, AV stenosis, COPD, HTN, CKD IIIb, and anemia who presented for elective ileostomy takedown, complicated by post-operative ileus.   Hospitalist service consulted for medical management.   On TPN for a time, transfused 2 units PRBCs for anemia, developed AKI on CKD, now off TPN.   Developed SOB and found to have pleural effusion, s/p thoracentesis on 1/30, transudative.  11/16/2021: Patient was seen and examined at bedside.  She reports dyspnea with minimal exertion worse with ambulation.  Denies any chest pain.  Endorses that she dips tobacco.  Advised to completely abstain from tobacco use.  She was receptive.  Endorses positive flatulence.  She has no lower abdominal pain with moderate palpation.  Bowel sounds present.    Assessment/Plan: Principal Problem:   Ileostomy status ([place]) Active Problems:   Hyponatremia   Acute respiratory failure with hypoxia (HCC)   Weakness   Acute kidney injury superimposed on CKD (HCC)   HTN (hypertension)   AF (paroxysmal atrial fibrillation) (HCC)   Normocytic anemia   Depression   Pleural effusion due to CHF (congestive heart failure) (HCC)   Hypokalemia   Acute on chronic combined systolic and diastolic CHF (congestive heart failure) (HCC)   Hyperphosphatemia   Hypomagnesemia   Anemia of chronic disease   Ileus following gastrointestinal surgery (HCC)   Aspiration pneumonia of right lower lobe due to gastric secretions (HCC)  Aspiration pneumonia of right lower lobe due to gastric secretions (HCC) Completed 9 days Unasyn, no further fever, leukocytosis.     -Continue Chest PT and pulmonary toilet.  Acute hypoxic respiratory failure secondary to above. At baseline she is on 2 L nasal cannula as needed Currently on 2 L with O2 saturation 90%. Wean off oxygen  supplementation as tolerated Continue to treat underlying conditions. Continue incentive spirometer  AKI on CKD 3B Creatinine is downtrending Continue to monitor urine output with strict I's and O's Continue to monitor toxic agents, dehydration and hypotension. Repeat renal panel in the morning.   Anemia of chronic disease Hemoglobin downtrending, no overt bleeding. -Continue Eliquis Continue to monitor H&H Consider transfusing if further hemoglobin drop with dyspnea on exertion. Repeat CBC in the morning  AF (paroxysmal atrial fibrillation) ([place])- (present on admission) - Supplement K -Continue eliquis -Continue diltiazem -Continue metoprolol \\Continue  to monitor on telemetry.   Acute on chronic combined systolic and diastolic CHF (congestive heart failure) (HCC) Previous EF 35 to 40% in 2019, since recovered, 60-65% this admission with grade I DD. Continue diuresing with IV Lasix 40 mg twice daily. Personally reviewed chest x-ray done on 11/16/2021 showing mild pulmonary edema and small right pleural effusion. Continue strict I's and O's and daily weight. Net I&O +3.3 L.   Right pleural effusion due to CHF (congestive heart failure) (HCC) status post right thoracentesis. Transfudative due to CHF flare   Ileus following gastrointestinal surgery ([place]) Resolved   Resolved post repletion: Hypokalemia- (present on admission) - Supplement potassium Potassium 3.8 from 3.2.   Depression- (present on admission) - Continue Celexa   HTN (hypertension)- (present on admission) BP is at goal. - Continue diltiazem, metoprolol - Hold amlodipine   Moderate protein calorie malnutrition As evidenced by moderate loss of subcutaneous muscle mass and failure in the setting of cancer and cancer related treatments.  Code Status: Full code  Family Communication: None at bedside  Disposition Plan: We will discharge when okay with primary team.   Consultants: We  are consultants   DVT prophylaxis: Eliquis  Status is: Inpatient  Patient requires at least 2 midnights for further evaluation and treatment of present condition.      Objective: Vitals:   11/16/21 0436 11/16/21 0500 11/16/21 0802 11/16/21 1515  BP: 136/83  121/88 122/78  Pulse: 87  87 89  Resp: 20  18 18   Temp: 98.4 F (36.9 C)  98.2 F (36.8 C) 97.6 F (36.4 C)  TempSrc: Oral   Oral  SpO2: 98%  94% 90%  Weight:  61.8 kg    Height:        Intake/Output Summary (Last 24 hours) at 11/16/2021 1528 Last data filed at 11/16/2021 8144 Gross per 24 hour  Intake 478 ml  Output 250 ml  Net 228 ml   Filed Weights   11/14/21 0607 11/15/21 0400 11/16/21 0500  Weight: 64.7 kg 63.2 kg 61.8 kg    Exam:  General: 66 y.o. year-old female well developed well nourished in no acute distress.  Alert and oriented x3. Cardiovascular: Regular rate and rhythm with no rubs or gallops.  No thyromegaly or JVD noted.   Respiratory: Mild rales at bases.  No wheezing noted.  Poor inspiratory effort. Abdomen: Soft nontender nondistended with normal bowel sounds x4 quadrants. Musculoskeletal: No lower extremity edema. 2/4 pulses in all 4 extremities. Skin: No ulcerative lesions noted or rashes, Psychiatry: Mood is appropriate for condition and setting   Data Reviewed: CBC: Recent Labs  Lab 11/10/21 1024 11/13/21 0445 11/15/21 1027 11/16/21 0506  WBC 8.4 7.4 7.9 6.8  HGB 7.8* 7.5* 8.0* 7.7*  HCT 24.8* 23.1* 24.8* 24.3*  MCV 97.6 92.4 94.7 95.7  PLT 294 330 321 818   Basic Metabolic Panel: Recent Labs  Lab 11/10/21 1024 11/11/21 1159 11/12/21 1000 11/15/21 1027 11/16/21 0506  NA 135 136 135 137 141  K 3.8 3.3* 3.7 3.2* 3.8  CL 102 104 104 100 103  CO2 22 23 23 30 31   GLUCOSE 134* 113* 122* 120* 84  BUN 75* 62* 55* 23 22  CREATININE 2.71* 2.51* 2.25* 1.78* 1.75*  CALCIUM 8.6* 8.1* 8.3* 8.5* 8.5*  PHOS 5.0* 4.0  --   --   --    GFR: Estimated Creatinine Clearance: 29.2  mL/min (A) (by C-G formula based on SCr of 1.75 mg/dL (H)). Liver Function Tests: Recent Labs  Lab 11/10/21 1024 11/11/21 1159  ALBUMIN 2.8* 2.6*   No results for input(s): LIPASE, AMYLASE in the last 168 hours. No results for input(s): AMMONIA in the last 168 hours. Coagulation Profile: No results for input(s): INR, PROTIME in the last 168 hours. Cardiac Enzymes: No results for input(s): CKTOTAL, CKMB, CKMBINDEX, TROPONINI in the last 168 hours. BNP (last 3 results) No results for input(s): PROBNP in the last 8760 hours. HbA1C: No results for input(s): HGBA1C in the last 72 hours. CBG: Recent Labs  Lab 11/14/21 2021  GLUCAP 109*   Lipid Profile: No results for input(s): CHOL, HDL, LDLCALC, TRIG, CHOLHDL, LDLDIRECT in the last 72 hours. Thyroid Function Tests: No results for input(s): TSH, T4TOTAL, FREET4, T3FREE, THYROIDAB in the last 72 hours. Anemia Panel: No results for input(s): VITAMINB12, FOLATE, FERRITIN, TIBC, IRON, RETICCTPCT in the last 72 hours. Urine analysis:    Component Value Date/Time   COLORURINE YELLOW (A) 11/05/2021 0845   APPEARANCEUR TURBID (A) 11/05/2021  [place] 12/06/2013 2245   LABSPEC 1.015 11/05/2021 0845   LABSPEC 1.015 12/06/2013 2245   PHURINE 6.0 11/05/2021 0845   GLUCOSEU NEGATIVE 11/05/2021 0845   GLUCOSEU Negative 12/06/2013 2245   HGBUR MODERATE (A) 11/05/2021 0845   BILIRUBINUR NEGATIVE 11/05/2021 0845   BILIRUBINUR Negative 12/06/2013 2245   KETONESUR NEGATIVE 11/05/2021 0845   PROTEINUR 30 (A) 11/05/2021 0845   NITRITE NEGATIVE 11/05/2021 0845   LEUKOCYTESUR LARGE (A) 11/05/2021 0845   LEUKOCYTESUR Negative 12/06/2013 2245   Sepsis Labs: @LABRCNTIP (procalcitonin:4,lacticidven:4)  ) Recent Results (from the past 240 hour(s))  Urine Culture     Status: None   Collection Time: 11/07/21  3:51 PM   Specimen: Urine, Clean Catch  Result Value Ref Range Status   Specimen Description   Final    URINE, CLEAN  CATCH Performed at [facility], [address], [place], [place] [address]    Special Requests   Final    NONE Performed at [facility], [address], [place], [place] [address]    Culture   Final    NO GROWTH Performed at [facility], [place] [address] [place], [place] [address]    Report Status 11/09/2021 FINAL  Final  Body fluid culture w Gram Stain     Status: None (Preliminary result)   Collection Time: 11/13/21 11:00 AM   Specimen: PATH Cytology Pleural fluid  Result Value Ref Range Status   Specimen Description   Final    PLEURAL Performed at [facility], [address] [place], [place] [address]    Special Requests   Final    NONE Performed at [facility], [address] [place], [place] [address]    Gram Stain   Final    WBC PRESENT,BOTH PMN AND MONONUCLEAR NO ORGANISMS SEEN CYTOSPIN SMEAR    Culture   Final    NO GROWTH 3 DAYS Performed at [facility], [place] [address], [place], [place] [address]    Report Status PENDING  Incomplete      Studies: DG Chest Port 1 View  Result Date: 11/16/2021 CLINICAL DATA:  Shortness of breath EXAM: PORTABLE CHEST 1 VIEW COMPARISON:  Previous studies including the examination of 11/14/2021 FINDINGS: Transverse diameter of heart is increased. Central pulmonary vessels are prominent. Linear densities in the right parahilar region have not changed significantly. Small bilateral pleural effusions are seen, more so on the right side. Crowding of markings in the medial lower lung fields suggest atelectasis/pneumonia. There is no pneumothorax. Tip of PICC line is seen in superior vena cava close to right atrium. IMPRESSION: Cardiomegaly. Central pulmonary vessels are prominent. Prominence of interstitial markings in the right parahilar region and both lower lung fields may suggest interstitial edema or pneumonia. Small bilateral pleural effusions, more so on the right side.  Electronically Signed   By: Elmer Picker M.D.   On: 11/16/2021 08:13    Scheduled Meds:  apixaban  2.5 mg Oral BID   Chlorhexidine Gluconate Cloth  6 each Topical Daily   citalopram  40 mg Oral [id number]   diltiazem  240 mg Oral Daily   famotidine  20 mg Oral Daily   feeding supplement  1 Container Oral TID BM   fluticasone furoate-vilanterol  1 puff Inhalation Daily   And   umeclidinium bromide  1 puff Inhalation Daily   furosemide  40 mg Intravenous BID   gabapentin  300 mg Oral BID   multivitamin  with minerals  1 tablet Oral Daily   pantoprazole  40 mg Oral QHS   potassium chloride  40 mEq Oral BID   sodium chloride flush  10-40 mL Intracatheter Q12H   Vitamin D (Ergocalciferol)  50,000 Units Oral Q7 days    Continuous Infusions:   LOS: 24 days     Kayleen Memos, MD [facility] Pager [phone]  If 7PM-7AM, please contact night-coverage [email or web address] Password [facility] 11/16/2021, 3:28 PM

## 2021-11-16 NOTE — Progress Notes (Signed)
Patient ID: Danielle [name], female   DOB: Aug 10, 1956, 66 y.o.   MRN: [id number]     [place] [facility] Day(s): 24.   Interval History: Patient seen and examined, no acute events or new complaints overnight. Patient reports she was able to walk to the second nurses station. Still getting shortness of breath but walking longer distances.   Vital signs in last 24 hours: [min-max] current  Temp:  [97.6 F (36.4 C)-98.8 F (37.1 C)] 97.6 F (36.4 C) (02/02 1515) Pulse Rate:  [87-91] 89 (02/02 1515) Resp:  [18-20] 18 (02/02 1515) BP: (109-136)/(69-88) 122/78 (02/02 1515) SpO2:  [90 %-98 %] 90 % (02/02 1515) Weight:  [61.8 kg] 61.8 kg (02/02 0500)     Height: 5\' 6"  (167.6 cm) Weight: 61.8 kg BMI (Calculated): 22   Physical Exam:  Constitutional: alert, cooperative and no distress  Respiratory: breathing non-labored at rest  Cardiovascular: irregular rythm Gastrointestinal: soft, non-tender, and non-distended  Labs:  CBC Latest Ref Rng & Units 11/16/2021 11/15/2021 11/13/2021  WBC 4.0 - 10.5 K/uL 6.8 7.9 7.4  Hemoglobin 12.0 - 15.0 g/dL 7.7(L) 8.0(L) 7.5(L)  Hematocrit 36.0 - 46.0 % 24.3(L) 24.8(L) 23.1(L)  Platelets 150 - 400 K/uL 361 321 330   CMP Latest Ref Rng & Units 11/16/2021 11/15/2021 11/12/2021  Glucose 70 - 99 mg/dL 84 120(H) 122(H)  BUN 8 - 23 mg/dL 22 23 55(H)  Creatinine 0.44 - 1.00 mg/dL 1.75(H) 1.78(H) 2.25(H)  Sodium 135 - 145 mmol/L 141 137 135  Potassium 3.5 - 5.1 mmol/L 3.8 3.2(L) 3.7  Chloride 98 - 111 mmol/L 103 100 104  CO2 22 - 32 mmol/L 31 30 23   Calcium 8.9 - 10.3 mg/dL 8.5(L) 8.5(L) 8.3(L)  Total Protein 6.5 - 8.1 g/dL - - -  Total Bilirubin 0.3 - 1.2 mg/dL - - -  Alkaline Phos 38 - 126 U/L - - -  AST 15 - 41 U/L - - -  ALT 0 - 44 U/L - - -    Imaging studies: No new pertinent imaging studies   Assessment/Plan:  66 y.o. female with history of an ileostomy and mucous fistula 24 Day Post-Op s/p ileostomy reversal, complicated by pertinent  comorbidities including COPD, atrial fibrillation on anticoagulation, hypertension, chronic kidney disease stage III.   Status post ileostomy reversal -Patient initially with prolonged postoperative ileus that resolved. -Tolerating soft diet well -Passing gas and having bowel movements -Stools are brown and not dark black as they use to be initially.      Chronic kidney disease stage III -Acute kidney injury after surgery.   --Nephrology managing with diuretics.   Shortness of breath -Multifactorial due to consideration on right lung consolidation on last chest x-ray.  Also with small pleural effusion.   -Completed Unasyn for treatment right lung pneumonia -Currently on diuretics for treatment of pleural effusions -Had thoracentesis for treatment of pleural effusion as well -Requiring nasal cannula at 2L/min  -Continues to need oxygen at home as needed but baseline was 2 L/min -Appreciate pulmonology management recommendations -Appreciate nephrology and cardiology for management of current medical comorbidities -Working daily with physical therapy  -Case discussed with consultants today to keep aggressive diuresis to improve tolerance to ambulation to be able to discharge her.    A. Fib -Intermittent rate control -Currently on therapeutic anticoagulation -Appreciate hospitalist and cardiologist evaluation and management   Encourage patient to get out of bed.  Will continue pain management.   Arnold Long, MD

## 2021-11-16 NOTE — Progress Notes (Signed)
PULMONOLOGY         Date: 11/16/2021,   MRN# [id number] Danielle [name] July 03, 1956     AdmissionWeight: 56.2 kg                 CurrentWeight: 61.8 kg   Referring physician: Dr Peyton Najjar   CHIEF COMPLAINT:   Increased O2 requirement.   HISTORY OF PRESENT ILLNESS   This is a pleasant 66 yo with complex comorbid history including, COPD, AF, HTN, CKD3, s/p ileostomy reversal with persistent increase O2 req and notable pleural effusion. She is s/p thoracentesis yielding 1L fluid drained from right pleural space.  She is on supplemental O2 and feels improved.    11/14/21 - patient received VEST therapy yesterday with RT and felt that it may be helping. We have repeat CXR scheduled for today. She took few breaths on IS up to 61mL tidal volume. We discussed importance of PT/OT and she agrees to work with them for strength training. Yesterday she had big BM during my visit along with physical therapist. She remains on 3.5L/min and Ive discussed with RN to wean that down as able with goal spO2 >88%.  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     11/16/21- patient is further improved with O2 weaned to 2L/min during my evaluation today.   PAST MEDICAL HISTORY   Past Medical History:  Diagnosis Date   Anemia    Anxiety    Aortic atherosclerosis ([place])    Aortic valve stenosis 02/10/2018   a.) TTE 02/10/2018: EF 55-60%: mild AS with MPG of 12 mmHg. b.) TTE 04/21/2018: EF 35-40%; mild AS with MPG 8 mmHg. c.) TTE 11/07/2019: EF 50-55%; mild AS with MPG 10 mmHg.   Arthritis    Atrial fibrillation (HCC)    a.) CHA2DS2-VASc = 4 (age, sex, HTN, aortic plaque). b.) Rate/rhythm maintained on oral carvedilol; chronically anticoagulated on dose reduced apixaban. c.)  Attempted deployment of LAA occlusive device on 01/11/2021; parameters failed and procedure aborted.   Breast cancer, left  ([place]) 2000   a.) T2N1M0; ER/PR (+) --> Tx'd with total mastectomy, LN resection, XRT, and chemotherapy   Cancer of right lung ([place]) 07/30/2016   a.) adenocarcinoma; ALK, ROS1, PDL1, BRAF, EGFR all negative.   CKD (chronic kidney disease), stage IV (HCC)    COPD (chronic obstructive pulmonary disease) (HCC)    Dependence on supplemental oxygen    Depression    Diastolic dysfunction 97/58/8325   a.) TTE 02/10/2017: EF 55-60%; G2DD. b.) TTE 04/21/2018: EF 35-40%; mild LA dilation; mod MV regurgitation. c.) TTE 11/07/2019: EF 50-55%; G1DD.   DOE (dyspnea on exertion)    GERD (gastroesophageal reflux disease)    Heart murmur    History of 2019 novel coronavirus disease (COVID-19) 10/14/2019   HLD (hyperlipidemia)    Hypertension    Long term current use of anticoagulant    a.) apixaban   Lymphedema    Personal history of chemotherapy    Personal history of radiation therapy    SBO (small bowel obstruction) ([place]) 11/05/2020   Vitamin D deficiency      SURGICAL HISTORY   Past Surgical History:  Procedure Laterality Date   Breast Biospy Left    [facility]   BREAST SURGERY     COLONOSCOPY N/A 04/30/2018   Procedure: COLONOSCOPY;  Surgeon: Virgel Manifold, MD;  Location: [facility] ENDOSCOPY;  Service: Endoscopy;  Laterality:  N/A;   COLONOSCOPY N/A 07/22/2018   Procedure: COLONOSCOPY;  Surgeon: Pasty Spillers, MD;  Location: [facility] ENDOSCOPY;  Service: Endoscopy;  Laterality: N/A;   COLONOSCOPY WITH PROPOFOL N/A 09/21/2021   Procedure: COLONOSCOPY WITH PROPOFOL;  Surgeon: Sung Amabile, DO;  Location: [facility] ENDOSCOPY;  Service: General;  Laterality: N/A;   DILATION AND CURETTAGE OF UTERUS     ELECTROMAGNETIC NAVIGATION BROCHOSCOPY Right 04/11/2016   Procedure: ELECTROMAGNETIC NAVIGATION BRONCHOSCOPY;  Surgeon: Stephanie Acre, MD;  Location: [facility] ORS;  Service: Cardiopulmonary;  Laterality: Right;   ESOPHAGOGASTRODUODENOSCOPY N/A 07/22/2018   Procedure: ESOPHAGOGASTRODUODENOSCOPY (EGD);   Surgeon: Pasty Spillers, MD;  [facility] ENDOSCOPY;  Service: Endoscopy;  Laterality: N/A;   ESOPHAGOGASTRODUODENOSCOPY (EGD) WITH PROPOFOL N/A 05/07/2018   Procedure: ESOPHAGOGASTRODUODENOSCOPY (EGD) WITH PROPOFOL;  Surgeon: Midge Minium, MD;  Location: [facility] ENDOSCOPY;  Service: Endoscopy;  Laterality: N/A;   ESOPHAGOGASTRODUODENOSCOPY (EGD) WITH PROPOFOL N/A 04/24/2019   Procedure: ESOPHAGOGASTRODUODENOSCOPY (EGD) WITH PROPOFOL;  Surgeon: Wyline Mood, MD;  Location: [facility] ENDOSCOPY;  Service: Gastroenterology;  Laterality: N/A;   ESOPHAGOGASTRODUODENOSCOPY (EGD) WITH PROPOFOL N/A 01/12/2020   Procedure: ESOPHAGOGASTRODUODENOSCOPY (EGD) WITH PROPOFOL;  Surgeon: Wyline Mood, MD;  Location: [facility] ENDOSCOPY;  Service: Gastroenterology;  Laterality: N/A;   ESOPHAGOGASTRODUODENOSCOPY (EGD) WITH PROPOFOL N/A 04/28/2020   Procedure: ESOPHAGOGASTRODUODENOSCOPY (EGD) WITH PROPOFOL;  Surgeon: Wyline Mood, MD;  Location: [facility] ENDOSCOPY;  Service: Gastroenterology;  Laterality: N/A;   EUS N/A 05/07/2019   Procedure: FULL UPPER ENDOSCOPIC ULTRASOUND (EUS) RADIAL;  Surgeon: Rayann Heman, MD;  Location: [facility] ENDOSCOPY;  Service: Endoscopy;  Laterality: N/A;   ILEOSCOPY N/A 07/22/2018   Procedure: ILEOSCOPY THROUGH STOMA;  Surgeon: Pasty Spillers, MD;  Location: [facility] ENDOSCOPY;  Service: Endoscopy;  Laterality: N/A;   ILEOSTOMY     ILEOSTOMY N/A 09/08/2018   Procedure: ILEOSTOMY REVISION POSSIBLE CREATION;  Surgeon: Carolan Shiver, MD;  Location: [facility] ORS;  Service: General;  Laterality: N/A;   ILEOSTOMY CLOSURE N/A 08/15/2018   Procedure: DILATION OF ILEOSTOMY STRICTURE;  Surgeon: Carolan Shiver, MD;  Location: [facility] ORS;  Service: General;  Laterality: N/A;   LAPAROTOMY Right 05/04/2018   Procedure: EXPLORATORY LAPAROTOMY right colectomy right and left ostomy;  Surgeon: Carolan Shiver, MD;  Location: [facility] ORS;  Service: General;  Laterality: Right;   LEFT ATRIAL APPENDAGE  OCCLUSION N/A 01/11/2021   Procedure: LEFT ATRIAL APPENDAGE OCCLUSION (WATCHMAN's DEVICE); ABORTED PROCEDURE WITHOUT DEVICE BEING IMPLANTED; Location: [facility]; Surgeon: Gerre Pebbles, MD   LUNG BIOPSY     MASTECTOMY Left    2000, [facility]   ROTATOR CUFF REPAIR Right    [facility]   XI ROBOTIC ASSISTED COLOSTOMY TAKEDOWN N/A 10/23/2021   Procedure: XI ROBOTIC ASSISTED ILEOSTOMY TAKEDOWN;  Surgeon: Carolan Shiver, MD;  Location: [facility] ORS;  Service: General;  Laterality: N/A;  180 minutes for the surgery part please     FAMILY HISTORY   Family History  Problem Relation Age of Onset   Breast cancer Mother 78   Cancer Mother        Breast    Cirrhosis Father    Breast cancer Paternal Aunt 49   Cancer Maternal Aunt        Breast      SOCIAL HISTORY   Social History   Tobacco Use   Smoking status: Former    Packs/day: 0.50    Years: 20.00    Pack years: 10.00    Types: Cigarettes    Quit date: 07/02/2012    Years since quitting: 9.3   Smokeless tobacco: Current  Types: Snuff   Tobacco comments:    quit 2014  Vaping Use   Vaping Use: Never used  Substance Use Topics   Alcohol use: Yes    Comment: Occasionally beer   Drug use: No     MEDICATIONS    Home Medication:    Current Medication:  Current Facility-Administered Medications:    albuterol (PROVENTIL) (2.5 MG/3ML) 0.083% nebulizer solution 3 mL, 3 mL, Nebulization, Q6H PRN, Windell Moment, Edgardo, MD, 3 mL at 11/14/21 0737   alum & mag hydroxide-simeth (MAALOX/MYLANTA) 200-200-20 MG/5ML suspension 15-30 mL, 15-30 mL, Oral, Q4H PRN, Windell Moment, Edgardo, MD, 30 mL at 11/14/21 1500   apixaban (ELIQUIS) tablet 2.5 mg, 2.5 mg, Oral, BID, Dallie Piles, RPH, 2.5 mg at 11/16/21 0944   Chlorhexidine Gluconate Cloth 2 % PADS 6 each, 6 each, Topical, Daily, Herbert Pun, MD, 6 each at 11/16/21 0944   citalopram (CELEXA) tablet 40 mg, 40 mg, Oral, BH-q7a, [name]-Diaz, Reeves Forth, MD, 40 mg at 11/16/21 0522    diltiazem (CARDIZEM CD) 24 hr capsule 240 mg, 240 mg, Oral, Daily, Tang, Lily Michelle, PA-C, 240 mg at 11/16/21 8250   famotidine (PEPCID) tablet 20 mg, 20 mg, Oral, Daily, [name]-Diaz, Reeves Forth, MD, 20 mg at 11/16/21 0943   feeding supplement (BOOST / RESOURCE BREEZE) liquid 1 Container, 1 Container, Oral, TID BM, Herbert Pun, MD, 1 Container at 11/16/21 0946   fluticasone furoate-vilanterol (BREO ELLIPTA) 100-25 MCG/ACT 1 puff, 1 puff, Inhalation, Daily, 1 puff at 11/16/21 0945 **AND** umeclidinium bromide (INCRUSE ELLIPTA) 62.5 MCG/ACT 1 puff, 1 puff, Inhalation, Daily, Benita Gutter, RPH, 1 puff at 11/16/21 0945   furosemide (LASIX) injection 40 mg, 40 mg, Intravenous, BID, Breeze, Shantelle, NP, 40 mg at 11/16/21 0749   gabapentin (NEURONTIN) capsule 300 mg, 300 mg, Oral, BID, [name]-Diaz, Edgardo, MD, 300 mg at 11/16/21 5397   HYDROmorphone (DILAUDID) injection 0.5 mg, 0.5 mg, Intravenous, Q2H PRN, Piscoya, Jose, MD, 0.5 mg at 11/09/21 1607   lip balm (BLISTEX) ointment, , Topical, PRN, Herbert Pun, MD, Given at 11/06/21 0905   menthol-cetylpyridinium (CEPACOL) lozenge 3 mg, 1 lozenge, Oral, PRN, Herbert Pun, MD, 3 mg at 11/07/21 0522   metoprolol tartrate (LOPRESSOR) injection 5 mg, 5 mg, Intravenous, Q6H PRN, Sharion Settler, NP, 5 mg at 10/31/21 1553   multivitamin with minerals tablet 1 tablet, 1 tablet, Oral, Daily, Herbert Pun, MD, 1 tablet at 11/16/21 0943   ondansetron (ZOFRAN-ODT) disintegrating tablet 4 mg, 4 mg, Oral, Q6H PRN, 4 mg at 11/07/21 1255 **OR** ondansetron (ZOFRAN) injection 4 mg, 4 mg, Intravenous, Q6H PRN, Herbert Pun, MD, 4 mg at 10/29/21 0051   oxyCODONE-acetaminophen (PERCOCET/ROXICET) 5-325 MG per tablet 1-2 tablet, 1-2 tablet, Oral, Q4H PRN, Herbert Pun, MD, 2 tablet at 11/16/21 0749   pantoprazole (PROTONIX) EC tablet 40 mg, 40 mg, Oral, QHS, Benita Gutter, RPH, 40 mg at 11/15/21 2144   potassium  chloride SA (KLOR-CON M) CR tablet 40 mEq, 40 mEq, Oral, BID, Danford, Suann Larry, MD, 40 mEq at 11/16/21 0944   sodium chloride (OCEAN) 0.65 % nasal spray 1 spray, 1 spray, Each Nare, PRN, Sharion Settler, NP, 1 spray at 11/12/21 1206   sodium chloride flush (NS) 0.9 % injection 10-40 mL, 10-40 mL, Intracatheter, Q12H, [name]-Diaz, Edgardo, MD, 10 mL at 11/16/21 0944   sodium chloride flush (NS) 0.9 % injection 10-40 mL, 10-40 mL, Intracatheter, PRN, Herbert Pun, MD   Vitamin D (Ergocalciferol) (DRISDOL) capsule 50,000 Units, 50,000 Units, Oral, Q7 days, Murlean Iba, MD,  50,000 Units at 11/13/21 1308    ALLERGIES   Patient has no known allergies.     REVIEW OF SYSTEMS    Review of Systems:  Gen:  Denies  fever, sweats, chills weigh loss  HEENT: Denies blurred vision, double vision, ear pain, eye pain, hearing loss, nose bleeds, sore throat Cardiac:  No dizziness, chest pain or heaviness, chest tightness,edema Resp:   Denies cough or sputum porduction, shortness of breath,wheezing, hemoptysis,  Gi: Denies swallowing difficulty, stomach pain, nausea or vomiting, diarrhea, constipation, bowel incontinence Gu:  Denies bladder incontinence, burning urine Ext:   Denies Joint pain, stiffness or swelling Skin: Denies  skin rash, easy bruising or bleeding or hives Endoc:  Denies polyuria, polydipsia , polyphagia or weight change Psych:   Denies depression, insomnia or hallucinations   Other:  All other systems negative   VS: BP 121/88 (BP Location: Left Leg)    Pulse 87    Temp 98.2 F (36.8 C)    Resp 18    Ht $R'5\' 6"'zZ$  (1.676 m)    Wt 61.8 kg    SpO2 94%    BMI 21.99 kg/m      PHYSICAL EXAM    GENERAL:NAD, no fevers, chills, no weakness no fatigue HEAD: Normocephalic, atraumatic.  EYES: Pupils equal, round, reactive to light. Extraocular muscles intact. No scleral icterus.  MOUTH: Moist mucosal membrane. Dentition intact. No abscess noted.  EAR, NOSE, THROAT:  Clear without exudates. No external lesions.  NECK: Supple. No thyromegaly. No nodules. No JVD.  PULMONARY: mild rhochi worse on right CARDIOVASCULAR: S1 and S2. Regular rate and rhythm. No murmurs, rubs, or gallops. No edema. Pedal pulses 2+ bilaterally.  GASTROINTESTINAL: Soft, nontender, nondistended. No masses. Positive bowel sounds. No hepatosplenomegaly.  MUSCULOSKELETAL: No swelling, clubbing, or edema. Range of motion full in all extremities.  NEUROLOGIC: Cranial nerves II through XII are intact. No gross focal neurological deficits. Sensation intact. Reflexes intact.  SKIN: No ulceration, lesions, rashes, or cyanosis. Skin warm and dry. Turgor intact.  PSYCHIATRIC: Mood, affect within normal limits. The patient is awake, alert and oriented x 3. Insight, judgment intact.       IMAGING    CT ABDOMEN PELVIS WO CONTRAST  Result Date: 11/05/2021 CLINICAL DATA:  Sepsis, fever, postoperative, rule out intra-abdominal abscess EXAM: CT ABDOMEN AND PELVIS WITHOUT CONTRAST TECHNIQUE: Multidetector CT imaging of the abdomen and pelvis was performed following the standard protocol without IV contrast. Oral enteric contrast was administered. RADIATION DOSE REDUCTION: This exam was performed according to the departmental dose-optimization program which includes automated exposure control, adjustment of the mA and/or kV according to patient size and/or use of iterative reconstruction technique. COMPARISON:  10/27/2021 FINDINGS: Lower chest: Small right pleural effusion associated atelectasis or consolidation. Three-vessel coronary artery calcifications. Hepatobiliary: No solid liver abnormality is seen. No gallstones, gallbladder wall thickening, or biliary dilatation. Pancreas: Unremarkable. No pancreatic ductal dilatation or surrounding inflammatory changes. Spleen: Normal in size without significant abnormality. Adrenals/Urinary Tract: Adrenal glands are unremarkable. Kidneys are normal, without renal  calculi, solid lesion, or hydronephrosis. Bladder is unremarkable. Stomach/Bowel: Stomach is within normal limits. The mid small bowel is diffusely gas and contrast filled, and mildly distended, largest loops measuring 4.1 cm. Status post right hemicolectomy. Descending and sigmoid diverticula. Vascular/Lymphatic: Aortic atherosclerosis. No enlarged abdominal or pelvic lymph nodes. Reproductive: No mass or other significant abnormality. Other: Anasarca. Persistent, although improved, scattered subcutaneous emphysema about the abdominal wall (series 2, image 38). Unchanged subcutaneous fluid collections of the left  and right ventral abdomen, collection on the left measuring 3.4 x 2.2 cm (series 2, image 36), collection on the right measuring 3.5 x 2.0 cm (series 2, image 43). No ascites. Musculoskeletal: No acute or significant osseous findings. IMPRESSION: 1. Unchanged subcutaneous fluid collections of the left and right ventral abdomen, collection on the left measuring 3.4 x 2.2 cm, collection on the right measuring 3.5 x 2.0 cm. These most likely reflect postoperative hematoma or seroma, although presence or absence of infection within this fluid is not established by CT. 2. Status post right hemicolectomy. 3. The mid small bowel is diffusely gas and contrast filled, and mildly distended, largest loops measuring 4.1 cm. Findings are most consistent with postoperative ileus. 4. Right pleural effusion and anasarca. 5. Coronary artery disease. Aortic Atherosclerosis (ICD10-I70.0). Electronically Signed   By: Delanna Ahmadi M.D.   On: 11/05/2021 17:34   CT ABDOMEN PELVIS WO CONTRAST  Result Date: 10/27/2021 CLINICAL DATA:  Postop day 4 from ileostomy reversal. Vomiting. Evaluate for anastomotic leak. EXAM: CT ABDOMEN AND PELVIS WITHOUT CONTRAST TECHNIQUE: Multidetector CT imaging of the abdomen and pelvis was performed following the standard protocol without IV contrast. RADIATION DOSE REDUCTION: This exam was  performed according to the departmental dose-optimization program which includes automated exposure control, adjustment of the mA and/or kV according to patient size and/or use of iterative reconstruction technique. COMPARISON:  Abdominopelvic CT 11/05/2020.  Radiographs 10/26/2021. FINDINGS: Study was performed with the patient in the right lateral decubitus position. Rectal contrast was administered. Lower chest: Apparent right middle lobe collapse with a small amount of adjacent pleural fluid, incompletely visualized. Mild dependent atelectasis in both lower lobes. There is atherosclerosis of the aorta and coronary arteries. Hepatobiliary: Stable low-density liver lesions, likely cysts based on stability. Stable mild biliary dilatation status post cholecystectomy. Pancreas: Unremarkable. No pancreatic ductal dilatation or surrounding inflammatory changes. Spleen: Normal in size without focal abnormality. Adrenals/Urinary Tract: Both adrenal glands appear normal. Both kidneys demonstrate mild perinephric soft tissue stranding, similar to previous study. No evidence of urinary tract calculus or hydronephrosis. The bladder appears unremarkable for its degree of distention. Stomach/Bowel: No oral contrast was administered. Rectal contrast has passed retrograde through the colon and through the ileocolonic anastomosis at the level of the mid transverse colon status post right hemicolectomy. There is retrograde filling of the distal small bowel. No anastomotic leak. The stomach and small bowel are fluid-filled and mildly distended. No evidence of bowel wall thickening or pneumatosis. Vascular/Lymphatic: There are no enlarged abdominal or pelvic lymph nodes. Diffuse aortic and branch vessel atherosclerosis. Reproductive: Stable appearance of the uterus and adnexa. No adnexal mass. Tubal ligation clips noted. Other: As seen on the postoperative radiographs, there is a large amount of soft tissue emphysema throughout the  anterior abdominal wall which extends from the perineum into the presternal soft tissues. There are small focal fluid collections within the right and left anterior abdominal wall, likely related to recent laparoscopic procedure. No free intraperitoneal air, ascites or focal extraluminal fluid collections identified within the peritoneal cavity. Musculoskeletal: No acute or significant osseous findings. Stable chronic mild superior endplate compression fractures at L3 and L5. IMPRESSION: 1. Status post recent ileostomy reversal and ileocolonic anastomosis. The anastomosis is patent, without leak. 2. Nonspecific gastric and proximal small bowel distension without focal transition point, likely related to a postoperative ileus. Recommend radiographic follow-up. Patient may benefit from a nasogastric tube. 3. As seen on postoperative radiographs, there is extensive soft tissue emphysema throughout the abdominal wall  which is likely related to the laparoscopic procedure. Soft tissue infection should be excluded clinically. Small fluid collections in the anterior abdominal wall are attributed to the procedure, and no unexpected collections are identified. There is no pneumoperitoneum or abnormal intra-abdominal fluid collection. 4. Atelectasis at both lung bases with apparent partial right middle lobe collapse. 5. Extensive Aortic Atherosclerosis (ICD10-I70.0). Electronically Signed   By: Richardean Sale M.D.   On: 10/27/2021 16:28   DG Chest 1 View  Result Date: 10/26/2021 CLINICAL DATA:  Hypotension EXAM: CHEST  1 VIEW COMPARISON:  11/16/2020, 06/05/2020, PET CT 12/01/2019, CT chest 11/06/2019, 11/06/2019 FINDINGS: Mild cardiomegaly with central vascular congestion. Small pleural effusions and diffuse increased interstitial opacity likely due to edema. Aortic atherosclerosis. No pneumothorax. Irregular opacity with mild distortion and nodularity in the right upper lobe and right hilus similar compared to  intermittent previous exams and presumably due to scarring. IMPRESSION: 1. Cardiomegaly with vascular congestion, small pleural effusions and mild diffuse interstitial edema 2. Irregular and nodular opacities in the right hilus and upper lobe present on previous exams and probably due to scarring Electronically Signed   By: Donavan Foil M.D.   On: 10/26/2021 17:54   DG Chest 2 View  Result Date: 11/09/2021 CLINICAL DATA:  Shortness of breath.  Mild chest pain.  Smoker. EXAM: CHEST - 2 VIEW COMPARISON:  11/05/2021 FINDINGS: Right PICC tip in the region of the inferior aspect of the superior vena cava without significant change. The nasogastric tube has been removed. Normal sized heart. Stable linear scarring in the right upper lobe. No significant change in a small right pleural effusion and right basilar airspace opacity. Clear left lung. Mildly tortuous and calcified thoracic aorta. No acute bony abnormality. IMPRESSION: Stable small right pleural effusion and right basilar atelectasis and/or pneumonia. Electronically Signed   By: Claudie Revering M.D.   On: 11/09/2021 10:17   DG Abd 1 View  Result Date: 11/03/2021 CLINICAL DATA:  NG tube placement EXAM: ABDOMEN - 1 VIEW COMPARISON:  11/03/2021 11:05 a.m. FINDINGS: Advancement of NG tube with tip and side port below the GE junction. Redemonstrated small bowel dilatation, not significantly changed from the prior exam. The stomach is not distended. The lower abdomen is not included for evaluation. IMPRESSION: Interval advancement of nasogastric tube, with tip and side port overlying the stomach. Overall unchanged small bowel dilatation in the imaged portion of the abdomen. Electronically Signed   By: Merilyn Baba M.D.   On: 11/03/2021 16:01   DG Abd 1 View  Result Date: 10/28/2021 CLINICAL DATA:  NG tube placement EXAM: ABDOMEN - 1 VIEW COMPARISON:  10/26/2021 FINDINGS: Limited radiograph of the lower chest and upper abdomen was obtained for the purposes of  enteric tube localization. Enteric tube is seen coursing below the diaphragm with distal tipterminating within the proximal stomach. Side port terminates within the distal esophagus. Similar degree of gaseous distension of large and small bowel within the visualized abdomen. IMPRESSION: Enteric tube with distal tip terminating within the proximal stomach, side port terminating within the distal esophagus. Recommend advancement 7-10 cm. Electronically Signed   By: Davina Poke D.O.   On: 10/28/2021 15:48   DG Abd 1 View  Result Date: 10/26/2021 CLINICAL DATA:  Hypotension history of ileostomy take down EXAM: ABDOMEN - 1 VIEW COMPARISON:  11/05/2020 FINDINGS: Clips in the pelvis. Moderate air distension of the stomach. Air-filled dilated small bowel up to 3.5 cm in the central abdomen. Extensive mottled appearance of the abdominal soft  tissues presumably due to soft tissue emphysema related to recent laparoscopic procedure. IMPRESSION: 1. Air distension of stomach and small bowel either due to ileus or possible obstruction. 2. Diffuse mottled appearance of soft tissues felt consistent with soft tissue emphysema likely due to recent laparoscopic procedure Electronically Signed   By: Donavan Foil M.D.   On: 10/26/2021 17:51   US RENAL  Result Date: 10/25/2021 CLINICAL DATA:  Acute kidney injury EXAM: RENAL / URINARY TRACT ULTRASOUND COMPLETE COMPARISON:  CT 11/05/2020 FINDINGS: Right Kidney: Renal measurements: 8.9 x 4.1 x 4.2 cm = volume: 79.8 mL. Cortex slightly echogenic. No mass or hydronephrosis Left Kidney: Renal measurements: 7.5 x 4.4 x 4 cm = volume: 88.5 mL. Cortex slightly echogenic. No mass or hydronephrosis. Bladder: Appears normal for degree of bladder distention. Other: None. IMPRESSION: Slight increased cortical echogenicity suggesting medical renal disease. No hydronephrosis. Electronically Signed   By: Donavan Foil M.D.   On: 10/25/2021 16:28   DG Chest Port 1 View  Result Date:  11/16/2021 CLINICAL DATA:  Shortness of breath EXAM: PORTABLE CHEST 1 VIEW COMPARISON:  Previous studies including the examination of 11/14/2021 FINDINGS: Transverse diameter of heart is increased. Central pulmonary vessels are prominent. Linear densities in the right parahilar region have not changed significantly. Small bilateral pleural effusions are seen, more so on the right side. Crowding of markings in the medial lower lung fields suggest atelectasis/pneumonia. There is no pneumothorax. Tip of PICC line is seen in superior vena cava close to right atrium. IMPRESSION: Cardiomegaly. Central pulmonary vessels are prominent. Prominence of interstitial markings in the right parahilar region and both lower lung fields may suggest interstitial edema or pneumonia. Small bilateral pleural effusions, more so on the right side. Electronically Signed   By: Elmer Picker M.D.   On: 11/16/2021 08:13   DG Chest Port 1 View  Result Date: 11/14/2021 CLINICAL DATA:  Atelectasis, history of small-bowel obstruction EXAM: PORTABLE CHEST 1 VIEW COMPARISON:  11/13/2021 FINDINGS: No significant change in AP portable chest radiograph, with cardiomegaly and right upper extremity PICC. Diffuse bilateral interstitial pulmonary opacity, small bilateral pleural effusions, and bandlike scarring or atelectasis of the right midlung, unchanged. IMPRESSION: No significant change in AP portable chest radiograph, with cardiomegaly, probable edema, and bilateral pleural effusions. No new airspace opacity. Electronically Signed   By: Delanna Ahmadi M.D.   On: 11/14/2021 09:58   DG Chest Port 1 View  Result Date: 11/13/2021 CLINICAL DATA:  Post right thoracentesis EXAM: PORTABLE CHEST 1 VIEW COMPARISON:  Chest x-ray dated November 12, 2021 FINDINGS: Cardiac and mediastinal contours are unchanged. Linear nodular opacity of the right upper lobe, unchanged from prior exam. No new parenchymal opacity. Interval decreased size of small right  pleural effusion. No evidence pneumothorax. IMPRESSION: No evidence of pneumothorax. Electronically Signed   By: Yetta Glassman M.D.   On: 11/13/2021 11:46   DG Chest Port 1 View  Result Date: 11/12/2021 CLINICAL DATA:  Acute respiratory failure with hypoxia. EXAM: PORTABLE CHEST 1 VIEW COMPARISON:  November 09, 2021 FINDINGS: Right upper extremity PICC with tip near the superior cavoatrial junction. The heart size and mediastinal contours are within normal limits. Stable linear scarring in the right upper lobe. Slightly increased small right pleural effusion with adjacent airspace opacities. Increased conspicuity of the irregular nodular areas of consolidation in the right mid lung. The visualized skeletal structures are unremarkable. IMPRESSION: 1. Slightly increased small right pleural effusion with adjacent airspace opacities. 2. Increased conspicuity of the irregular nodular  areas of consolidation in the right mid lung. Electronically Signed   By: Dahlia Bailiff M.D.   On: 11/12/2021 08:55   DG Chest Port 1 View  Result Date: 11/05/2021 CLINICAL DATA:  Fever. EXAM: PORTABLE CHEST 1 VIEW COMPARISON:  Chest radiograph 10/26/2021. FINDINGS: Right upper extremity PICC line tip projects over the superior vena cava. Enteric tube is present. The side port is at the level of the distal esophagus. Stable enlarged cardiac and mediastinal contours. Similar-appearing distortion and consolidation within the mid right lung. Bibasilar atelectasis, right-greater-than-left. Probable small right pleural effusion. No pneumothorax. IMPRESSION: Enteric tube side-port at the distal esophagus, recommend advancement. Right upper extremity PICC line tip projects over the superior vena cava. Similar-appearing irregular and nodular areas of consolidation within the right mid lung. These results will be called to the ordering clinician or representative by the Radiologist Assistant, and communication documented in the PACS or  [employer]. Electronically Signed   By: Lovey Newcomer M.D.   On: 11/05/2021 08:04   DG Abd 2 Views  Result Date: 11/06/2021 CLINICAL DATA:  Abdominal pain EXAM: ABDOMEN - 2 VIEW COMPARISON:  Abdominal x-ray 11/03/2021 FINDINGS: Enteric tube identified with the tip in the lower esophagus. Mildly distended loops of small bowel are again seen throughout the abdomen measuring up to 3.3 cm in diameter. No free air identified on the decubitus view. No suspicious calcifications identified. IMPRESSION: 1. Enteric tube tip is in the lower esophagus, advancement recommended. 2. Mildly distended loops of bowel throughout the abdomen which could be seen with ileus or obstruction. Continued follow-up recommended. Electronically Signed   By: Ofilia Neas M.D.   On: 11/06/2021 10:42   DG Abd 2 Views  Result Date: 11/03/2021 CLINICAL DATA:  Abdominal distention EXAM: ABDOMEN - 2 VIEW COMPARISON:  Previous studies including the examination of 11/02/2021 FINDINGS: There is interval worsening of small bowel dilation measuring up to 4.9 cm in diameter. Tip enteric tube is noted at the gastroesophageal junction pointing cephalad. There is no distention of stomach. There is interval passage of contrast from the colon. There is evidence of previous tubal ligation. IMPRESSION: There is interval increase in small bowel dilation suggesting high-grade small bowel obstruction. Tip of enteric tube is at the gastroesophageal junction pointing cephalad. NG tube should be advanced 5-10 cm to place the tip and side port within the stomach. Electronically Signed   By: Elmer Picker M.D.   On: 11/03/2021 12:36   DG Abd 2 Views  Result Date: 10/31/2021 CLINICAL DATA:  Ileus following gastrointestinal surgery, colostomy takedown 10/23/2021 EXAM: ABDOMEN - 2 VIEW COMPARISON:  10/28/2021 FINDINGS: Nasogastric tube in proximal stomach. Persistent dilatation of small bowel loops in the abdomen without wall thickening. Small  amount retained contrast in colon. Levoconvex lumbar scoliosis. Foci of soft tissue gas at RIGHT lateral abdominal wall consistent with preceding surgery. Atelectasis at RIGHT lung base. IMPRESSION: Persistent small bowel ileus. Electronically Signed   By: Lavonia Dana M.D.   On: 10/31/2021 10:59   DG Abd Portable 1V-Small Bowel Obstruction Protocol-initial, 8 hr delay  Result Date: 11/02/2021 CLINICAL DATA:  8 hour follow-up small-bowel delay EXAM: PORTABLE ABDOMEN - 1 VIEW COMPARISON:  Film from the previous day. FINDINGS: Gastric catheter is noted within the stomach. Contrast material administered now lies within the dilated small bowel as well as within the colon. These changes are consistent with a partial small bowel obstruction. No free air is noted. IMPRESSION: Passage of contrast into the colon consistent with a  partial small bowel obstruction. Electronically Signed   By: Inez Catalina M.D.   On: 11/02/2021 03:13   DG Abd Portable 1V  Result Date: 11/01/2021 CLINICAL DATA:  Enteric catheter placement EXAM: PORTABLE ABDOMEN - 1 VIEW COMPARISON:  10/31/2021 FINDINGS: Frontal view of the lower chest and upper abdomen demonstrates enteric catheter tip and side port projecting over the gastric fundus. Right-sided PICC tip overlies atriocaval junction. Cardiac silhouette is enlarged. Small right pleural effusion. Stable vascular congestion. Stable distended gas-filled loops of small bowel, likely ileus given recent surgery. IMPRESSION: 1. Enteric catheter projecting over gastric fundus. 2. Stable small bowel dilatation consistent with ileus. Electronically Signed   By: Randa Ngo M.D.   On: 11/01/2021 16:02   ECHOCARDIOGRAM COMPLETE  Result Date: 10/27/2021    ECHOCARDIOGRAM REPORT   Patient Name:   TRANAE LARAMIE [facility] Date of Exam: 10/27/2021 Medical Rec #:  [id number]       Height:       66.0 in Accession #:    [id number]      Weight:       124.0 lb Date of Birth:  [birth year]       BSA:          63.632 m  Patient Age:    48 years        BP:           144/84 mmHg Patient Gender: F               HR:           95 bpm. Exam Location:  [facility] Procedure: 2D Echo, Cardiac Doppler and Color Doppler Indications:     Elevated troponin  History:         Patient has prior history of Echocardiogram examinations, most                  recent 11/07/2019. COPD, Arrythmias:Atrial Fibrillation,                  Signs/Symptoms:Murmur; Risk Factors:Hypertension.  Sonographer:     Sherrie Sport Referring Phys:  [place] Diagnosing Phys: Donnelly Angelica IMPRESSIONS  1. Left ventricular ejection fraction, by estimation, is 60 to 65%. The left ventricle has normal function. The left ventricle has no regional wall motion abnormalities. Left ventricular diastolic parameters are consistent with Grade I diastolic dysfunction (impaired relaxation).  2. Right ventricular systolic function is normal. The right ventricular size is mildly enlarged.  3. Right atrial size was mildly dilated.  4. The mitral valve is normal in structure. Moderate mitral valve regurgitation. No evidence of mitral stenosis.  5. Tricuspid valve regurgitation is moderate.  6. The aortic valve is calcified. Aortic valve regurgitation is not visualized. Mild aortic valve stenosis.  7. The inferior vena cava is normal in size with greater than 50% respiratory variability, suggesting right atrial pressure of 3 mmHg. Conclusion(s)/Recommendation(s): CALCIFIED AORTIC VALVE WITH MILD AS. VMax 2.1 m/s. Dimensionless index 0.4. FINDINGS  Left Ventricle: Left ventricular ejection fraction, by estimation, is 60 to 65%. The left ventricle has normal function. The left ventricle has no regional wall motion abnormalities. The left ventricular internal cavity size was normal in size. There is  no left ventricular hypertrophy. Left ventricular diastolic parameters are consistent with Grade I diastolic dysfunction (impaired relaxation). Right Ventricle: The right ventricular size is  mildly enlarged. No increase in right ventricular wall thickness. Right ventricular systolic function is normal. Left Atrium: Left atrial size was normal  in size. Right Atrium: Right atrial size was mildly dilated. Pericardium: There is no evidence of pericardial effusion. Mitral Valve: The mitral valve is normal in structure. Moderate mitral valve regurgitation. No evidence of mitral valve stenosis. MV peak gradient, 3.1 mmHg. The mean mitral valve gradient is 1.0 mmHg. Tricuspid Valve: The tricuspid valve is normal in structure. Tricuspid valve regurgitation is moderate . No evidence of tricuspid stenosis. Aortic Valve: The aortic valve is calcified. Aortic valve regurgitation is not visualized. Mild aortic stenosis is present. Aortic valve mean gradient measures 9.4 mmHg. Aortic valve peak gradient measures 16.8 mmHg. Aortic valve area, by VTI measures 1.21 cm. Pulmonic Valve: The pulmonic valve was not well visualized. Pulmonic valve regurgitation is not visualized. No evidence of pulmonic stenosis. Aorta: The aortic root is normal in size and structure. Venous: The inferior vena cava is normal in size with greater than 50% respiratory variability, suggesting right atrial pressure of 3 mmHg. IAS/Shunts: No atrial level shunt detected by color flow Doppler.  LEFT VENTRICLE PLAX 2D LVIDd:         4.80 cm   Diastology LVIDs:         3.00 cm   LV e' medial:    5.87 cm/s LV PW:         1.10 cm   LV E/e' medial:  13.9 LV IVS:        0.75 cm   LV e' lateral:   12.40 cm/s LVOT diam:     2.00 cm   LV E/e' lateral: 6.6 LV SV:         45 LV SV Index:   28 LVOT Area:     3.14 cm  RIGHT VENTRICLE RV Basal diam:  3.80 cm RV S prime:     12.20 cm/s TAPSE (M-mode): 1.8 cm LEFT ATRIUM             Index        RIGHT ATRIUM           Index LA diam:        3.20 cm 1.96 cm/m   RA Area:     19.30 cm LA Vol (A2C):   58.8 ml 36.03 ml/m  RA Volume:   63.90 ml  39.15 ml/m LA Vol (A4C):   38.7 ml 23.71 ml/m LA Biplane Vol: 49.1 ml  30.08 ml/m  AORTIC VALVE                     PULMONIC VALVE AV Area (Vmax):    1.04 cm      PV Vmax:        0.59 m/s AV Area (Vmean):   0.91 cm      PV Vmean:       35.200 cm/s AV Area (VTI):     1.21 cm      PV VTI:         0.109 m AV Vmax:           205.20 cm/s   PV Peak grad:   1.4 mmHg AV Vmean:          140.200 cm/s  PV Mean grad:   1.0 mmHg AV VTI:            0.373 m       RVOT Peak grad: 4 mmHg AV Peak Grad:      16.8 mmHg AV Mean Grad:      9.4 mmHg LVOT Vmax:  67.90 cm/s LVOT Vmean:        40.500 cm/s LVOT VTI:          0.144 m LVOT/AV VTI ratio: 0.39  AORTA Ao Root diam: 3.17 cm MITRAL VALVE               TRICUSPID VALVE MV Area (PHT): 4.86 cm    TR Peak grad:   52.4 mmHg MV Area VTI:   3.01 cm    TR Vmax:        362.00 cm/s MV Peak grad:  3.1 mmHg MV Mean grad:  1.0 mmHg    SHUNTS MV Vmax:       0.88 m/s    Systemic VTI:  0.14 m MV Vmean:      55.0 cm/s   Systemic Diam: 2.00 cm MV Decel Time: 156 msec    Pulmonic VTI:  0.157 m MV E velocity: 81.40 cm/s MV A velocity: 51.40 cm/s MV E/A ratio:  1.58 Donnelly Angelica Electronically signed by Donnelly Angelica Signature Date/Time: 10/27/2021/4:07:10 PM    Final    [place] EKG SITE RITE  Result Date: 11/01/2021 If Site Rite image not attached, placement could not be confirmed due to current cardiac rhythm.  US THORACENTESIS ASP PLEURAL SPACE W/IMG GUIDE  Result Date: 11/13/2021 INDICATION: Right pleural effusion with shortness breath request received for diagnostic and therapeutic thoracentesis. EXAM: ULTRASOUND GUIDED RIGHT THORACENTESIS MEDICATIONS: Local 1% lidocaine only. COMPLICATIONS: None immediate. PROCEDURE: An ultrasound guided thoracentesis was thoroughly discussed with the patient and questions answered. The benefits, risks, alternatives and complications were also discussed. The patient understands and wishes to proceed with the procedure. Written consent was obtained. Ultrasound was performed to localize and mark an adequate pocket of fluid in  the right chest. The area was then prepped and draped in the normal sterile fashion. 1% Lidocaine was used for local anesthesia. Under ultrasound guidance a 19 gauge, 7-cm, Yueh catheter was introduced. Thoracentesis was performed. The catheter was removed and a dressing applied. FINDINGS: A total of approximately 1 L of amber colored fluid was removed. Samples were sent to the laboratory as requested by the clinical team. IMPRESSION: Successful ultrasound guided right thoracentesis yielding 1 L of pleural fluid. This exam was performed by Tsosie Billing PA-C, and was supervised and interpreted by Dr. Denna Haggard. Electronically Signed   By: Albin Felling M.D.   On: 11/13/2021 12:05       ASSESSMENT/PLAN   Acute on chronic hypoxemic respiratory failure   - Due to moderate right pleural effusion with associated right lower lobe complete atelectasis   -s/p thoracentesis - 1 L - aspirated -                 -patient would benefit from diuresis - nephrology on case appreciate collaboration  Moderate pleural effusion   S/p aspiration - fluid pattern studies in process     Suspect transudative fluid due to CKD - Noted nephrology on case - appreciate input   Right lower lobe atelectasis   - patient would benefit from recruitment maneuvers    - will order RT Metaneb with albuterol BID    - Incentive spirometry and flutter valve PRN  CKD   - likely culprit with fluid overload contributing to pulmonary and gut edema   Atrial fibrillation - chronic - potential for rapid ventricular response due to albuterol/ipratropium therapy     Thank you for allowing me to participate in the care of this patient.    Patient/Family  are satisfied with care plan and all questions have been answered.  This document was prepared using Dragon voice recognition software and may include unintentional dictation errors.     Ottie Glazier, M.D.  Division of Pulmonary & [place]          .fa

## 2021-11-16 NOTE — Progress Notes (Addendum)
Mobility Specialist - Progress Note   11/16/21 1400  Mobility  Activity Ambulated with assistance in hallway  Level of Assistance Standby assist, set-up cues, supervision of patient - no hands on  Assistive Device Front wheel walker  Distance Ambulated (ft) 120 ft  Activity Response Tolerated well  Transport method Ambulatory  $Mobility charge 1 Mobility    Pre-mobility: 108 HR, 96% SpO2 During mobility: 117-135 HR, 89% SpO2 Post-mobility: 110 HR, 93% SpO2   Pt lying in bed upon arrival, utilizing 2L. Pt ambulated in hallway 20' x 6, with sats maintaining > 88% on 3L. Still SOB. No dizziness. Max HR 135 bpm. Pt returned to bed with needs in reach, O2 back on 2L. RN notified.    Kathee Delton Mobility Specialist 11/16/21, 3:01 PM

## 2021-11-17 LAB — CBC
HCT: 24.3 % — ABNORMAL LOW (ref 36.0–46.0)
Hemoglobin: 7.9 g/dL — ABNORMAL LOW (ref 12.0–15.0)
MCH: 30.9 pg (ref 26.0–34.0)
MCHC: 32.5 g/dL (ref 30.0–36.0)
MCV: 94.9 fL (ref 80.0–100.0)
Platelets: 317 10*3/uL (ref 150–400)
RBC: 2.56 MIL/uL — ABNORMAL LOW (ref 3.87–5.11)
RDW: 15.9 % — ABNORMAL HIGH (ref 11.5–15.5)
WBC: 7.2 10*3/uL (ref 4.0–10.5)
nRBC: 0 % (ref 0.0–0.2)

## 2021-11-17 LAB — RENAL FUNCTION PANEL
Albumin: 2.7 g/dL — ABNORMAL LOW (ref 3.5–5.0)
Anion gap: 8 (ref 5–15)
BUN: 22 mg/dL (ref 8–23)
CO2: 29 mmol/L (ref 22–32)
Calcium: 8.6 mg/dL — ABNORMAL LOW (ref 8.9–10.3)
Chloride: 101 mmol/L (ref 98–111)
Creatinine, Ser: 1.87 mg/dL — ABNORMAL HIGH (ref 0.44–1.00)
GFR, Estimated: 29 mL/min — ABNORMAL LOW (ref >60)
Glucose, Bld: 79 mg/dL (ref 70–99)
Phosphorus: 3.8 mg/dL (ref 2.5–4.6)
Potassium: 4.6 mmol/L (ref 3.5–5.1)
Sodium: 138 mmol/L (ref 135–145)

## 2021-11-17 LAB — MAGNESIUM: Magnesium: 1.3 mg/dL — ABNORMAL LOW (ref 1.7–2.4)

## 2021-11-17 MED ORDER — MAGNESIUM SULFATE 2 GM/50ML IV SOLN
2.0000 g | Freq: Once | INTRAVENOUS | Status: AC
  Administered 2021-11-17: 2 g via INTRAVENOUS
  Filled 2021-11-17: qty 50

## 2021-11-17 MED ORDER — IPRATROPIUM-ALBUTEROL 0.5-2.5 (3) MG/3ML IN SOLN
3.0000 mL | Freq: Three times a day (TID) | RESPIRATORY_TRACT | Status: DC
  Administered 2021-11-17 – 2021-11-18 (×4): 3 mL via RESPIRATORY_TRACT
  Filled 2021-11-17 (×4): qty 3

## 2021-11-17 NOTE — Progress Notes (Addendum)
PROGRESS NOTE  Danielle [name] TML:[id number] DOB: Aug 05, 1956 DOA: 10/23/2021 PCP: [facility]  HPI/Recap of past 24 hours: [name] is a 66 y.o. F with paroxysmal atrial fibrillation on Eliquis, AV stenosis, COPD, HTN, CKD IIIb, and anemia of chronic disease who presented to [facility] for elective ileostomy takedown, complicated by post-operative ileus which has now resolved.  Hospitalist service consulted for medical management.   On TPN for a time, transfused 2 units PRBCs for anemia, developed AKI on CKD 3B, now off TPN.   Developed SOB and found to have right pleural effusion, s/p thoracentesis on 11/13/21, transudative.  Hospital course complicated by persistent dyspnea with ambulation.  Mild pulmonary edema seen on chest x-ray done on 11/16/2021, ongoing IV diuresing since 11/10/2021 with extra doses of IV Lasix intermittently.  11/17/2021: Patient was seen at her bedside.  Somnolent but easily arousable to voices.  She has no new complaints.    Assessment/Plan: Principal Problem:   Ileostomy status ([place]) Active Problems:   Hyponatremia   Acute respiratory failure with hypoxia (HCC)   Weakness   Acute kidney injury superimposed on CKD (HCC)   HTN (hypertension)   AF (paroxysmal atrial fibrillation) (HCC)   Normocytic anemia   Depression   Pleural effusion due to CHF (congestive heart failure) (HCC)   Hypokalemia   Acute on chronic combined systolic and diastolic CHF (congestive heart failure) (HCC)   Hyperphosphatemia   Hypomagnesemia   Anemia of chronic disease   Ileus following gastrointestinal surgery (HCC)   Aspiration pneumonia of right lower lobe due to gastric secretions (HCC)  Aspiration pneumonia of right lower lobe due to gastric secretions (HCC) Completed 9 days Unasyn, no further fever, leukocytosis.   Continue bronchodilators as needed and pulmonary toilet.  Acute hypoxic respiratory failure secondary to above and mild pulmonary edema/right  pleural effusion. At baseline she is on 2 L nasal cannula as needed Currently on 2 L with O2 saturation 90%. Wean off oxygen supplementation as tolerated Continue to treat underlying conditions. Continue incentive spirometer, flutter valve. Mobilize as tolerated Home oxygen evaluation on 11/17/2021, follow results.  Worsening AKI on CKD 3B Creatinine is up trending Continue to monitor urine output with strict I's and O's Continue to follow nephrotoxic agents and hypotension. Repeat renal panel in the morning if creatinine worsens, consider DC IV Lasix and starting home torsemide for DC planning.   Anemia of chronic disease in the setting of advanced CKD Hemoglobin is uptrending 7.9 No overt bleeding -Continue home Eliquis Continue to monitor H&H  AF (paroxysmal atrial fibrillation) ([place])- (present on admission) -Continue eliquis -Continue home diltiazem and metoprolol Continue to monitor on telemetry.   Acute on chronic combined systolic and diastolic CHF (congestive heart failure) (HCC) Previous EF 35 to 40% in 2019, since recovered 2D echo obtained on 10/27/2021 LVEF 60-65% with grade I DD. Currently on IV Lasix 40 mg twice daily. Personally reviewed chest x-ray done on 11/16/2021 showing mild pulmonary edema and small right pleural effusion. Continue strict I's and O's and daily weight Net I&O +4.4 L   Right pleural effusion due to CHF (congestive heart failure) (HCC) status post right thoracentesis. Transfudative due to CHF flare No pathology on fluid seen at the time of this visit.   Resolved ileus following gastrointestinal surgery ([place])   Resolved post repletion: Hypokalemia- (present on admission) - Supplement potassium Potassium 3.8 from 3.2.   Depression- (present on admission) Stable - Continue Celexa   HTN (hypertension)- (present on admission)  Stable. - Continue diltiazem, metoprolol Continue to closely monitor vital signs.  Moderate protein calorie  malnutrition BMI 22, albumin 2.7 from 11/17/2021. As evidenced by moderate loss of subcutaneous muscle mass in the setting of cancer and cancer related treatments. Continue to encourage increase oral protein calorie intake   Hypomagnesemia, likely secondary to diuresis Magnesium 1.3 Repleted intravenously Repeat level in the morning.          Code Status: Full code  Family Communication: None at bedside    Consultants: We are consultants   DVT prophylaxis: Eliquis  Status is: Inpatient  Patient requires at least 2 midnights for further evaluation and treatment of present condition.      Objective: Vitals:   11/17/21 0449 11/17/21 0801 11/17/21 0808 11/17/21 1401  BP:  109/78    Pulse:  95    Resp:  18    Temp:  99 F (37.2 C)    TempSrc:  Oral    SpO2:  96% 91% 90%  Weight: 62.3 kg     Height:        Intake/Output Summary (Last 24 hours) at 11/17/2021 1425 Last data filed at 11/17/2021 1408 Gross per 24 hour  Intake 660 ml  Output 200 ml  Net 460 ml   Filed Weights   11/15/21 0400 11/16/21 0500 11/17/21 0449  Weight: 63.2 kg 61.8 kg 62.3 kg    Exam:  General: 66 y.o. year-old female frail-appearing no acute distress.  She is somnolent but easily arousable to voices.   Cardiovascular: Regular rate and rhythm no rubs or gallops.   Respiratory: Mild rales at bases with no wheezing noted.  Poor inspiratory effort.   Abdomen: Soft nontender normal bowel sounds present. Musculoskeletal: No lower extremity edema bilaterally.   Skin: No ulcerative lesions noted. Psychiatry: Mood is appropriate for condition and setting.   Data Reviewed: CBC: Recent Labs  Lab 11/13/21 0445 11/15/21 1027 11/16/21 0506 11/17/21 0511  WBC 7.4 7.9 6.8 7.2  HGB 7.5* 8.0* 7.7* 7.9*  HCT 23.1* 24.8* 24.3* 24.3*  MCV 92.4 94.7 95.7 94.9  PLT 330 321 361 338   Basic Metabolic Panel: Recent Labs  Lab 11/11/21 1159 11/12/21 1000 11/15/21 1027 11/16/21 0506  11/17/21 0511  NA 136 135 137 141 138  K 3.3* 3.7 3.2* 3.8 4.6  CL 104 104 100 103 101  CO2 23 23 30 31 29   GLUCOSE 113* 122* 120* 84 79  BUN 62* 55* 23 22 22   CREATININE 2.51* 2.25* 1.78* 1.75* 1.87*  CALCIUM 8.1* 8.3* 8.5* 8.5* 8.6*  MG  --   --   --   --  1.3*  PHOS 4.0  --   --   --  3.8   GFR: Estimated Creatinine Clearance: 27.3 mL/min (A) (by C-G formula based on SCr of 1.87 mg/dL (H)). Liver Function Tests: Recent Labs  Lab 11/11/21 1159 11/17/21 0511  ALBUMIN 2.6* 2.7*   No results for input(s): LIPASE, AMYLASE in the last 168 hours. No results for input(s): AMMONIA in the last 168 hours. Coagulation Profile: No results for input(s): INR, PROTIME in the last 168 hours. Cardiac Enzymes: No results for input(s): CKTOTAL, CKMB, CKMBINDEX, TROPONINI in the last 168 hours. BNP (last 3 results) No results for input(s): PROBNP in the last 8760 hours. HbA1C: No results for input(s): HGBA1C in the last 72 hours. CBG: Recent Labs  Lab 11/14/21 2021  GLUCAP 109*   Lipid Profile: No results for input(s): CHOL, HDL, LDLCALC, TRIG, CHOLHDL,  LDLDIRECT in the last 72 hours. Thyroid Function Tests: No results for input(s): TSH, T4TOTAL, FREET4, T3FREE, THYROIDAB in the last 72 hours. Anemia Panel: No results for input(s): VITAMINB12, FOLATE, FERRITIN, TIBC, IRON, RETICCTPCT in the last 72 hours. Urine analysis:    Component Value Date/Time   COLORURINE YELLOW (A) 11/05/2021 0845   APPEARANCEUR TURBID (A) 11/05/2021 0845   APPEARANCEUR Clear 12/06/2013 2245   LABSPEC 1.015 11/05/2021 0845   LABSPEC 1.015 12/06/2013 2245   PHURINE 6.0 11/05/2021 0845   GLUCOSEU NEGATIVE 11/05/2021 0845   GLUCOSEU Negative 12/06/2013 2245   HGBUR MODERATE (A) 11/05/2021 0845   BILIRUBINUR NEGATIVE 11/05/2021 0845   BILIRUBINUR Negative 12/06/2013 2245   KETONESUR NEGATIVE 11/05/2021 0845   PROTEINUR 30 (A) 11/05/2021 0845   NITRITE NEGATIVE 11/05/2021 0845   LEUKOCYTESUR LARGE (A)  11/05/2021 0845   LEUKOCYTESUR Negative 12/06/2013 2245   Sepsis Labs: @LABRCNTIP (procalcitonin:4,lacticidven:4)  ) Recent Results (from the past 240 hour(s))  Urine Culture     Status: None   Collection Time: 11/07/21  3:51 PM   Specimen: Urine, Clean Catch  Result Value Ref Range Status   Specimen Description   Final    URINE, CLEAN CATCH Performed at [facility], [address] [place], [place] [address]    Special Requests   Final    NONE Performed at [facility], [address] [place], [place] [address]    Culture   Final    NO GROWTH Performed at [facility], [place] [address], [place], [place] [address]    Report Status 11/09/2021 FINAL  Final  Body fluid culture w Gram Stain     Status: None   Collection Time: 11/13/21 11:00 AM   Specimen: PATH Cytology Pleural fluid  Result Value Ref Range Status   Specimen Description   Final    PLEURAL Performed at [facility], [address] [place], [place] [address]    Special Requests   Final    NONE Performed at [facility], [address] [place], [place] [address]    Gram Stain   Final    WBC PRESENT,BOTH PMN AND MONONUCLEAR NO ORGANISMS SEEN CYTOSPIN SMEAR    Culture   Final    NO GROWTH 3 DAYS Performed at [facility], [place] [address], [place], [place] [address]    Report Status 11/16/2021 FINAL  Final      Studies: No results found.  Scheduled Meds:  apixaban  2.5 mg Oral BID   Chlorhexidine Gluconate Cloth  6 each Topical Daily   citalopram  40 mg Oral [id number]   diltiazem  240 mg Oral Daily   famotidine  20 mg Oral Daily   feeding supplement  1 Container Oral TID BM   fluticasone furoate-vilanterol  1 puff Inhalation Daily   And   umeclidinium bromide  1 puff Inhalation Daily   furosemide  40 mg Intravenous BID   gabapentin  300 mg Oral BID   ipratropium-albuterol  3 mL Nebulization TID   multivitamin with minerals  1 tablet Oral Daily    pantoprazole  40 mg Oral QHS   potassium chloride  40 mEq Oral BID   sodium chloride flush  10-40 mL Intracatheter Q12H   Vitamin D (Ergocalciferol)  50,000 Units Oral Q7 days    Continuous Infusions:   LOS: 25 days     Kayleen Memos, MD [facility] Pager [phone]  If 7PM-7AM, please contact night-coverage [email or web address] Password  [email or web address] 11/17/2021, 2:25 PM

## 2021-11-17 NOTE — Progress Notes (Signed)
Patient ID: Danielle [name], female   DOB: 07/02/1956, 66 y.o.   MRN: [id number]     [facility] Day(s): 25.   Interval History: Patient seen and examined, no acute events or new complaints overnight. Patient reports feeling well.  Endorse that she had pain on the lower abdomen that was controlled with pain medications.  Continue feeling fatigued.  Vital signs in last 24 hours: [min-max] current  Temp:  [97.6 F (36.4 C)-99 F (37.2 C)] 99 F (37.2 C) (02/03 0801) Pulse Rate:  [89-96] 95 (02/03 0801) Resp:  [18-20] 18 (02/03 0801) BP: (109-122)/(74-78) 109/78 (02/03 0801) SpO2:  [90 %-97 %] 91 % (02/03 0808) Weight:  [62.3 kg] 62.3 kg (02/03 0449)     Height: 5\' 6"  (167.6 cm) Weight: 62.3 kg BMI (Calculated): 22.18   Physical Exam:  Constitutional: alert, cooperative and no distress  Respiratory: breathing non-labored at rest  Cardiovascular: regular rate and sinus rhythm  Gastrointestinal: soft, non-tender, and non-distended  Labs:  CBC Latest Ref Rng & Units 11/17/2021 11/16/2021 11/15/2021  WBC 4.0 - 10.5 K/uL 7.2 6.8 7.9  Hemoglobin 12.0 - 15.0 g/dL 7.9(L) 7.7(L) 8.0(L)  Hematocrit 36.0 - 46.0 % 24.3(L) 24.3(L) 24.8(L)  Platelets 150 - 400 K/uL 317 361 321   CMP Latest Ref Rng & Units 11/17/2021 11/16/2021 11/15/2021  Glucose 70 - 99 mg/dL 79 84 120(H)  BUN 8 - 23 mg/dL 22 22 23   Creatinine 0.44 - 1.00 mg/dL 1.87(H) 1.75(H) 1.78(H)  Sodium 135 - 145 mmol/L 138 141 137  Potassium 3.5 - 5.1 mmol/L 4.6 3.8 3.2(L)  Chloride 98 - 111 mmol/L 101 103 100  CO2 22 - 32 mmol/L 29 31 30   Calcium 8.9 - 10.3 mg/dL 8.6(L) 8.5(L) 8.5(L)  Total Protein 6.5 - 8.1 g/dL - - -  Total Bilirubin 0.3 - 1.2 mg/dL - - -  Alkaline Phos 38 - 126 U/L - - -  AST 15 - 41 U/L - - -  ALT 0 - 44 U/L - - -    Imaging studies: No new pertinent imaging studies   Assessment/Plan:  66 y.o. female with history of an ileostomy and mucous fistula 25 Day Post-Op s/p ileostomy reversal,  complicated by pertinent comorbidities including COPD, atrial fibrillation on anticoagulation, hypertension, chronic kidney disease stage III.   Status post ileostomy reversal -Patient initially with prolonged postoperative ileus that resolved. -Tolerating soft diet well -Passing gas and having bowel movements    Chronic kidney disease stage III -Acute kidney injury after surgery.   --Nephrology managing with diuretics.   Shortness of breath -Multifactorial due to consideration on right lung consolidation on last chest x-ray.  Also with small pleural effusion.   -Completed Unasyn for treatment right lung pneumonia -Currently on diuretics for treatment of pleural effusions -Had thoracentesis for treatment of pleural effusion as well -Today found at nasal cannula at 3L/min  -Continues to need oxygen at home as needed but baseline was 2 L/min -Appreciate pulmonology management recommendations -Appreciate nephrology and cardiology for management of current medical comorbidities -Working daily with physical therapy  -Case discussed with consultants today to keep aggressive diuresis to improve tolerance to ambulation to be able to discharge her.  -Pending medical stabilization off for medical comorbidities to be able to discharge   A. Fib -Intermittent rate control -Currently on therapeutic anticoagulation -Appreciate hospitalist and cardiologist evaluation and management   Encourage patient to get out of bed.  Will continue pain management.   Reeves Forth  Dereck Leep, MD

## 2021-11-17 NOTE — Progress Notes (Signed)
Mobility Specialist - Progress Note   11/17/21 1517  Mobility  Activity Refused mobility     Pt refused activity, just finished with PT. Will attempt another day and time.  Kathee Delton Mobility Specialist 11/17/21, 3:19 PM

## 2021-11-17 NOTE — Progress Notes (Signed)
Physical Therapy Treatment Patient Details Name: Danielle [name] MRN: [id number] DOB: 1956/01/14 Today's Date: 11/17/2021   History of Present Illness 66 y/o female s/p iliostomy take down 1/09.    PT Comments    Pt was long sitting in bed upon arriving. She agrees to session with encouragement. Pt seems down/depressed. She was on 3 L O2 [place] upon arriving. Author questions pleth/ reliability of reading throughout session. Moved oximeter to earlobe with slightly improved pleth but still poor overall. No symptoms or c/o dizziness throughout. She ambulated 200 ft total with 4 standing rest. Less SOB overall. Pt will benefit from continued skilled PT at DC to address deficits while maximizing independence with ADLs.     Recommendations for follow up therapy are one component of a multi-disciplinary discharge planning process, led by the attending physician.  Recommendations may be updated based on patient status, additional functional criteria and insurance authorization.  Follow Up Recommendations  Home health PT     Assistance Recommended at Discharge Intermittent Supervision/Assistance  Patient can return home with the following A little help with walking and/or transfers;A little help with bathing/dressing/bathroom;Assistance with cooking/housework;Assistance with feeding;Direct supervision/assist for medications management;Direct supervision/assist for financial management;Assist for transportation;Help with stairs or ramp for entrance   Equipment Recommendations  Rolling walker (2 wheels)       Precautions / Restrictions Precautions Precautions: Fall Restrictions Weight Bearing Restrictions: No     Mobility  Bed Mobility Overal bed mobility: Needs Assistance Bed Mobility: Supine to Sit, Sit to Supine     Supine to sit: Modified independent (Device/Increase time), HOB elevated Sit to supine: Modified independent (Device/Increase time)        Transfers Overall transfer  level: Needs assistance Equipment used: Rolling walker (2 wheels) Transfers: Sit to/from Stand Sit to Stand: Modified independent (Device/Increase time)      General transfer comment: Pt was able to stand without physical assistance    Ambulation/Gait Ambulation/Gait assistance: Min guard Gait Distance (Feet): 200 Feet Assistive device: Rolling walker (2 wheels) Gait Pattern/deviations: Step-through pattern Gait velocity: decreased     General Gait Details: Pt was able to ambulate 200 ft without LOB or safety concern. Did take several standing rest breaks (4 x during 200 ft) sao2 pleth reading poor throughout. moved pulseoximeter to earlobe with slightly improved reading     Balance Overall balance assessment: Needs assistance Sitting-balance support: No upper extremity supported Sitting balance-Leahy Scale: Good     Standing balance support: Bilateral upper extremity supported Standing balance-Leahy Scale: Fair Standing balance comment: Reliant on the walker in both dynamic and static standing.  No LOBs but far from her baseline w/o AD      Cognition Arousal/Alertness: Awake/alert Behavior During Therapy: WFL for tasks assessed/performed Overall Cognitive Status: Within Functional Limits for tasks assessed      General Comments: Pt A and O x 4               Pertinent Vitals/Pain Pain Assessment Pain Assessment: No/denies pain Pain Score: 0-No pain     PT Goals (current goals can now be found in the care plan section) Acute Rehab PT Goals Patient Stated Goal: go home Progress towards PT goals: Progressing toward goals    Frequency    Min 2X/week      PT Plan Current plan remains appropriate       AM-PAC PT "6 Clicks" Mobility   Outcome Measure  Help needed turning from your back to your side while in a flat  bed without using bedrails?: None Help needed moving from lying on your back to sitting on the side of a flat bed without using bedrails?:  None Help needed moving to and from a bed to a chair (including a wheelchair)?: None Help needed standing up from a chair using your arms (e.g., wheelchair or bedside chair)?: A Little Help needed to walk in hospital room?: A Little Help needed climbing 3-5 steps with a railing? : A Little 6 Click Score: 21    End of Session Equipment Utilized During Treatment: Gait belt;Oxygen Activity Tolerance: Patient tolerated treatment well Patient left: in bed;with call bell/phone within reach;with bed alarm set Nurse Communication: Mobility status PT Visit Diagnosis: Muscle weakness (generalized) (M62.81);Difficulty in walking, not elsewhere classified (R26.2)     Time: 5916-3846 PT Time Calculation (min) (ACUTE ONLY): 14 min  Charges:  $Gait Training: 8-22 mins                     Julaine Fusi PTA 11/17/21, 3:16 PM

## 2021-11-17 NOTE — Progress Notes (Signed)
[facility] [place], [place] 11/17/21  Subjective:   LOS: 25 02/02 0701 - 02/03 0700 In: 240 [P.O.:240] Out: 200 [Urine:200]  Patient sitting up in bed Continues to complains shortness of breath with activity with short distances Lower extremity edema improved.    Objective:  Vital signs in last 24 hours:  Temp:  [97.6 F (36.4 C)-99 F (37.2 C)] 99 F (37.2 C) (02/03 0801) Pulse Rate:  [89-96] 95 (02/03 0801) Resp:  [18-20] 18 (02/03 0801) BP: (109-122)/(74-78) 109/78 (02/03 0801) SpO2:  [90 %-97 %] 91 % (02/03 0808) Weight:  [62.3 kg] 62.3 kg (02/03 0449)  Weight change: 0.5 kg Filed Weights   11/15/21 0400 11/16/21 0500 11/17/21 0449  Weight: 63.2 kg 61.8 kg 62.3 kg    Intake/Output:    Intake/Output Summary (Last 24 hours) at 11/17/2021 1140 Last data filed at 11/17/2021 1015 Gross per 24 hour  Intake 480 ml  Output 200 ml  Net 280 ml      Physical Exam: General: NAD elderly female, laying in the bed  HEENT Moist oral mucosa  Pulm/lungs Diminished at bases, normal effort, [place] O2  CVS/Heart No rub or gallop  Abdomen:  Soft, bowel sounds present in right lower quadrant  Extremities: 1+ peripheral edema  Neurologic: Alert, able to answer questions appropriately  Skin: Warm, dry          Basic Metabolic Panel:  Recent Labs  Lab 11/11/21 1159 11/12/21 1000 11/15/21 1027 11/16/21 0506 11/17/21 0511  NA 136 135 137 141 138  K 3.3* 3.7 3.2* 3.8 4.6  CL 104 104 100 103 101  CO2 23 23 30 31 29   GLUCOSE 113* 122* 120* 84 79  BUN 62* 55* 23 22 22   CREATININE 2.51* 2.25* 1.78* 1.75* 1.87*  CALCIUM 8.1* 8.3* 8.5* 8.5* 8.6*  MG  --   --   --   --  1.3*  PHOS 4.0  --   --   --  3.8      CBC: Recent Labs  Lab 11/13/21 0445 11/15/21 1027 11/16/21 0506 11/17/21 0511  WBC 7.4 7.9 6.8 7.2  HGB 7.5* 8.0* 7.7* 7.9*  HCT 23.1* 24.8* 24.3* 24.3*  MCV 92.4 94.7 95.7 94.9  PLT 330 321 361 317      No results found for: HEPBSAG,  HEPBSAB, HEPBIGM    Microbiology:  Recent Results (from the past 240 hour(s))  Urine Culture     Status: None   Collection Time: 11/07/21  3:51 PM   Specimen: Urine, Clean Catch  Result Value Ref Range Status   Specimen Description   Final    URINE, CLEAN CATCH Performed at [facility], [address] [place], [place] [id number]    Special Requests   Final    NONE Performed at [facility], [address], [place], [place] [address]    Culture   Final    NO GROWTH Performed at [facility], [place] [address] [place], [place] [address]    Report Status 11/09/2021 FINAL  Final  Body fluid culture w Gram Stain     Status: None   Collection Time: 11/13/21 11:00 AM   Specimen: PATH Cytology Pleural fluid  Result Value Ref Range Status   Specimen Description   Final    PLEURAL Performed at [facility], [address] [place], [place] [address]    Special Requests   Final    NONE Performed at [facility], [phone]  [place] [place], [place] [address]    Gram Stain   Final    WBC PRESENT,BOTH PMN AND MONONUCLEAR NO ORGANISMS SEEN CYTOSPIN SMEAR    Culture   Final    NO GROWTH 3 DAYS Performed at [facility], [place] [address] [place], [place] [address]    Report Status 11/16/2021 FINAL  Final    Coagulation Studies: No results for input(s): LABPROT, INR in the last 72 hours.  Urinalysis: No results for input(s): COLORURINE, LABSPEC, PHURINE, GLUCOSEU, HGBUR, BILIRUBINUR, KETONESUR, PROTEINUR, UROBILINOGEN, NITRITE, LEUKOCYTESUR in the last 72 hours.  Invalid input(s): APPERANCEUR     Imaging: DG Chest Port 1 View  Result Date: 11/16/2021 CLINICAL DATA:  Shortness of breath EXAM: PORTABLE CHEST 1 VIEW COMPARISON:  Previous studies including the examination of 11/14/2021 FINDINGS: Transverse diameter of heart is increased. Central pulmonary vessels are prominent. Linear densities in the right parahilar region have not  changed significantly. Small bilateral pleural effusions are seen, more so on the right side. Crowding of markings in the medial lower lung fields suggest atelectasis/pneumonia. There is no pneumothorax. Tip of PICC line is seen in superior vena cava close to right atrium. IMPRESSION: Cardiomegaly. Central pulmonary vessels are prominent. Prominence of interstitial markings in the right parahilar region and both lower lung fields may suggest interstitial edema or pneumonia. Small bilateral pleural effusions, more so on the right side. Electronically Signed   By: Elmer Picker M.D.   On: 11/16/2021 08:13     Medications:      apixaban  2.5 mg Oral BID   Chlorhexidine Gluconate Cloth  6 each Topical Daily   citalopram  40 mg Oral [id number]   diltiazem  240 mg Oral Daily   famotidine  20 mg Oral Daily   feeding supplement  1 Container Oral TID BM   fluticasone furoate-vilanterol  1 puff Inhalation Daily   And   umeclidinium bromide  1 puff Inhalation Daily   furosemide  40 mg Intravenous BID   gabapentin  300 mg Oral BID   ipratropium-albuterol  3 mL Nebulization TID   multivitamin with minerals  1 tablet Oral Daily   pantoprazole  40 mg Oral QHS   potassium chloride  40 mEq Oral BID   sodium chloride flush  10-40 mL Intracatheter Q12H   Vitamin D (Ergocalciferol)  50,000 Units Oral Q7 days   albuterol, alum & mag hydroxide-simeth, HYDROmorphone (DILAUDID) injection, lip balm, menthol-cetylpyridinium, metoprolol tartrate, ondansetron **OR** ondansetron (ZOFRAN) IV, oxyCODONE-acetaminophen, sodium chloride, sodium chloride flush  Assessment/ Plan:  67 y.o. 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  Coronary artery disease, atherosclerosis, aortic stenosis, chronic  atrial fibrillation   Principal Problem:   Ileostomy status ([place]) Active Problems:   Hyponatremia   Acute respiratory failure with hypoxia (HCC)   Weakness   Acute kidney injury superimposed on CKD (HCC)   HTN (hypertension)   AF (paroxysmal atrial fibrillation) (HCC)   Normocytic anemia   Depression   Pleural effusion due to CHF (congestive heart failure) (HCC)   Hypokalemia   Acute on chronic combined systolic and diastolic CHF (congestive heart failure) (HCC)   Hyperphosphatemia   Hypomagnesemia   Anemia of chronic disease   Ileus following gastrointestinal surgery ([place])   Aspiration pneumonia of right lower lobe due to gastric secretions ([place])   #.  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 Baseline Creatnine 1.25 / GFR  on 10/30/2021 Recent Labs    11/12/21 1000 11/15/21 1027 11/16/21 0506 11/17/21 0511  CREATININE 2.25* 1.78* 1.75* 1.87*   AKI likely multifactorial Urinalysis from 1/22 shows large leukocytes, no WBCs, suggesting ? interstitial nephritis.    Hypotension can contribute to worsening renal function. -Creatinine stable -We will schedule follow-up appointment at discharge  #. Anemia of CKD  Lab Results  Component Value Date   HGB 7.9 (L) 11/17/2021   Patient has received blood transfusions this admission.   Hgb below target but stable. May start ESA's outpatient  #. SHPTH     Component Value Date/Time   PTH 152 (H) 10/30/2019 0435   Lab Results  Component Value Date   PHOS 3.8 11/17/2021  Will continue to monitor bone mineral metabolism during this admission   # S/p elective ileostomy reversal Hospital stay complicated by ileus Surgery following, pain management and mobility recommended   LOS: [place] 2/3/202311:40 AM  [place], [place]

## 2021-11-18 ENCOUNTER — Inpatient Hospital Stay: Payer: Medicare Other

## 2021-11-18 LAB — BASIC METABOLIC PANEL
Anion gap: 6 (ref 5–15)
BUN: 23 mg/dL (ref 8–23)
CO2: 32 mmol/L (ref 22–32)
Calcium: 8.8 mg/dL — ABNORMAL LOW (ref 8.9–10.3)
Chloride: 100 mmol/L (ref 98–111)
Creatinine, Ser: 1.98 mg/dL — ABNORMAL HIGH (ref 0.44–1.00)
GFR, Estimated: 27 mL/min — ABNORMAL LOW (ref >60)
Glucose, Bld: 96 mg/dL (ref 70–99)
Potassium: 5.4 mmol/L — ABNORMAL HIGH (ref 3.5–5.1)
Sodium: 138 mmol/L (ref 135–145)

## 2021-11-18 LAB — CBC
HCT: 24.6 % — ABNORMAL LOW (ref 36.0–46.0)
Hemoglobin: 7.9 g/dL — ABNORMAL LOW (ref 12.0–15.0)
MCH: 30.4 pg (ref 26.0–34.0)
MCHC: 32.1 g/dL (ref 30.0–36.0)
MCV: 94.6 fL (ref 80.0–100.0)
Platelets: 337 10*3/uL (ref 150–400)
RBC: 2.6 MIL/uL — ABNORMAL LOW (ref 3.87–5.11)
RDW: 15.6 % — ABNORMAL HIGH (ref 11.5–15.5)
WBC: 8.8 10*3/uL (ref 4.0–10.5)
nRBC: 0 % (ref 0.0–0.2)

## 2021-11-18 LAB — MAGNESIUM: Magnesium: 2 mg/dL (ref 1.7–2.4)

## 2021-11-18 MED ORDER — FUROSEMIDE 10 MG/ML IJ SOLN
40.0000 mg | Freq: Two times a day (BID) | INTRAMUSCULAR | Status: DC
  Administered 2021-11-19 – 2021-11-21 (×5): 40 mg via INTRAVENOUS
  Filled 2021-11-18 (×5): qty 4

## 2021-11-18 MED ORDER — MIDODRINE HCL 5 MG PO TABS
5.0000 mg | ORAL_TABLET | Freq: Three times a day (TID) | ORAL | Status: DC
  Administered 2021-11-19 (×2): 5 mg via ORAL
  Filled 2021-11-18 (×2): qty 1

## 2021-11-18 MED ORDER — IPRATROPIUM-ALBUTEROL 0.5-2.5 (3) MG/3ML IN SOLN
3.0000 mL | Freq: Two times a day (BID) | RESPIRATORY_TRACT | Status: DC
  Administered 2021-11-18 – 2021-11-19 (×2): 3 mL via RESPIRATORY_TRACT
  Filled 2021-11-18 (×2): qty 3

## 2021-11-18 NOTE — Progress Notes (Signed)
Patient ID: Danielle [name], female   DOB: 10-24-1955, 66 y.o.   MRN: [id number]     [place] [facility] Day(s): 26.   Interval History: Patient seen and examined, no acute events or new complaints overnight. Patient reports still feeling fatigued when trying to get out of bed to the commode at bedside.  She said that she is making progress and tolerating a little bit better but still with significant shortness of breath.  Denies chest pain.  Denies abdominal pain.  Tolerating diet.  Vital signs in last 24 hours: [min-max] current  Temp:  [98 F (36.7 C)-98.6 F (37 C)] 98.6 F (37 C) (02/04 0809) Pulse Rate:  [85-104] 104 (02/04 0809) Resp:  [18-20] 18 (02/04 0809) BP: (100-118)/(55-83) 105/83 (02/04 0809) SpO2:  [90 %-97 %] 93 % (02/04 0809) Weight:  [62.4 kg] 62.4 kg (02/04 0500)     Height: 5\' 6"  (167.6 cm) Weight: 62.4 kg BMI (Calculated): 22.2   Physical Exam:  Constitutional: alert, cooperative and no distress  Respiratory: breathing non-labored at rest  Cardiovascular: regular rate and sinus rhythm  Gastrointestinal: soft, non-tender, and non-distended  Labs:  CBC Latest Ref Rng & Units 11/18/2021 11/17/2021 11/16/2021  WBC 4.0 - 10.5 K/uL 8.8 7.2 6.8  Hemoglobin 12.0 - 15.0 g/dL 7.9(L) 7.9(L) 7.7(L)  Hematocrit 36.0 - 46.0 % 24.6(L) 24.3(L) 24.3(L)  Platelets 150 - 400 K/uL 337 317 361   CMP Latest Ref Rng & Units 11/18/2021 11/17/2021 11/16/2021  Glucose 70 - 99 mg/dL 96 79 84  BUN 8 - 23 mg/dL 23 22 22   Creatinine 0.44 - 1.00 mg/dL 1.98(H) 1.87(H) 1.75(H)  Sodium 135 - 145 mmol/L 138 138 141  Potassium 3.5 - 5.1 mmol/L 5.4(H) 4.6 3.8  Chloride 98 - 111 mmol/L 100 101 103  CO2 22 - 32 mmol/L 32 29 31  Calcium 8.9 - 10.3 mg/dL 8.8(L) 8.6(L) 8.5(L)  Total Protein 6.5 - 8.1 g/dL - - -  Total Bilirubin 0.3 - 1.2 mg/dL - - -  Alkaline Phos 38 - 126 U/L - - -  AST 15 - 41 U/L - - -  ALT 0 - 44 U/L - - -    Imaging studies: No new pertinent imaging  studies   Assessment/Plan:  66 y.o. female with history of an ileostomy and mucous fistula 26 Day Post-Op s/p ileostomy reversal, complicated by pertinent comorbidities including COPD, atrial fibrillation on anticoagulation, hypertension, chronic kidney disease stage III.   Status post ileostomy reversal -Patient initially with prolonged postoperative ileus that resolved. -Tolerating soft diet well -Passing gas and having bowel movements    Chronic kidney disease stage III -Acute kidney injury after surgery.   --Nephrology managing with diuretics.   Shortness of breath -Multifactorial due to consideration on right lung consolidation on last chest x-ray.  Also with small pleural effusion.   -Completed Unasyn for treatment right lung pneumonia -Currently on diuretics for treatment of pleural effusions -Had thoracentesis for treatment of pleural effusion as well -Today found at nasal cannula at 3L/min  -Continues to need oxygen at home as needed but baseline was 2 L/min -Appreciate pulmonology management recommendations -Appreciate nephrology and cardiology for management of current medical comorbidities -Working daily with physical therapy  -I will repeat chest x-ray for further evaluation of previous pleural effusion. I think that we are making progress toward discharge if this pleural effusion is better optimized and patient tolerated getting out of bed better she will be able to go safely  home soon.   A. Fib -Intermittent rate control -Currently on therapeutic anticoagulation -Appreciate hospitalist and cardiologist evaluation and management   Encourage patient to get out of bed.  Will continue pain management.   Arnold Long, MD

## 2021-11-18 NOTE — Progress Notes (Signed)
PULMONOLOGY         Date: 11/18/2021,   MRN# [id number] Danielle [name] 1956/10/14     AdmissionWeight: 56.2 kg                 CurrentWeight: 62.4 kg   Referring physician: Dr Peyton Najjar   CHIEF COMPLAINT:   Increased O2 requirement.   HISTORY OF PRESENT ILLNESS   This is a pleasant 66 yo with complex comorbid history including, COPD, AF, HTN, CKD3, s/p ileostomy reversal with persistent increase O2 req and notable pleural effusion. She is s/p thoracentesis yielding 1L fluid drained from right pleural space.  She is on supplemental O2 and feels improved.    11/14/21 - patient received VEST therapy yesterday with RT and felt that it may be helping. We have repeat CXR scheduled for today. She took few breaths on IS up to 628mL tidal volume. We discussed importance of PT/OT and she agrees to work with them for strength training. Yesterday she had big BM during my visit along with physical therapist. She remains on 3.5L/min and Ive discussed with RN to wean that down as able with goal spO2 >88%.  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     11/16/21- patient is further improved with O2 weaned to 2L/min during my evaluation today.  11/18/21- patient still hospitalized and has increased O2 requirement at 3L/min now.  She cannot walk without desaturation.  I think this is related to fluid balance and renal failure. CXR with bilateral effusions and pulmonary edema.  Renal function is worse this am.  Im gonna increase her blood pressure today and this should help with diuresis and we should continue PT to treat atelectatic segments.   PAST MEDICAL HISTORY   Past Medical History:  Diagnosis Date   Anemia    Anxiety    Aortic atherosclerosis ([place])    Aortic valve stenosis 02/10/2018   a.) TTE 02/10/2018: EF 55-60%: mild AS with MPG of 12 mmHg. b.) TTE 04/21/2018: EF 35-40%; mild AS  with MPG 8 mmHg. c.) TTE 11/07/2019: EF 50-55%; mild AS with MPG 10 mmHg.   Arthritis    Atrial fibrillation (HCC)    a.) CHA2DS2-VASc = 4 (age, sex, HTN, aortic plaque). b.) Rate/rhythm maintained on oral carvedilol; chronically anticoagulated on dose reduced apixaban. c.)  Attempted deployment of LAA occlusive device on 01/11/2021; parameters failed and procedure aborted.   Breast cancer, left ([place]) 2000   a.) T2N1M0; ER/PR (+) --> Tx'd with total mastectomy, LN resection, XRT, and chemotherapy   Cancer of right lung ([place]) 07/30/2016   a.) adenocarcinoma; ALK, ROS1, PDL1, BRAF, EGFR all negative.   CKD (chronic kidney disease), stage IV (HCC)    COPD (chronic obstructive pulmonary disease) (HCC)    Dependence on supplemental oxygen    Depression    Diastolic dysfunction 08/13/1313   a.) TTE 02/10/2017: EF 55-60%; G2DD. b.) TTE 04/21/2018: EF 35-40%; mild LA dilation; mod MV regurgitation. c.) TTE 11/07/2019: EF 50-55%; G1DD.   DOE (dyspnea on exertion)    GERD (gastroesophageal reflux disease)    Heart murmur    History of 2019 novel coronavirus disease (COVID-19) 10/14/2019   HLD (hyperlipidemia)    Hypertension    Long term current use of anticoagulant    a.) apixaban   Lymphedema    Personal history of chemotherapy    Personal history of radiation therapy  SBO (small bowel obstruction) ([place]) 11/05/2020   Vitamin D deficiency      SURGICAL HISTORY   Past Surgical History:  Procedure Laterality Date   Breast Biospy Left    [facility]   BREAST SURGERY     COLONOSCOPY N/A 04/30/2018   Procedure: COLONOSCOPY;  Surgeon: Virgel Manifold, MD;  Location: [facility] ENDOSCOPY;  Service: Endoscopy;  Laterality: N/A;   COLONOSCOPY N/A 07/22/2018   Procedure: COLONOSCOPY;  Surgeon: Virgel Manifold, MD;  Location: [facility] ENDOSCOPY;  Service: Endoscopy;  Laterality: N/A;   COLONOSCOPY WITH PROPOFOL N/A 09/21/2021   Procedure: COLONOSCOPY WITH PROPOFOL;  Surgeon: Benjamine Sprague, DO;   Location: [facility] ENDOSCOPY;  Service: General;  Laterality: N/A;   DILATION AND CURETTAGE OF UTERUS     ELECTROMAGNETIC NAVIGATION BROCHOSCOPY Right 04/11/2016   Procedure: ELECTROMAGNETIC NAVIGATION BRONCHOSCOPY;  Surgeon: Vilinda Boehringer, MD;  Location: [facility] ORS;  Service: Cardiopulmonary;  Laterality: Right;   ESOPHAGOGASTRODUODENOSCOPY N/A 07/22/2018   Procedure: ESOPHAGOGASTRODUODENOSCOPY (EGD);  Surgeon: Virgel Manifold, MD;  Location: [facility] ENDOSCOPY;  Service: Endoscopy;  Laterality: N/A;   ESOPHAGOGASTRODUODENOSCOPY (EGD) WITH PROPOFOL N/A 05/07/2018   Procedure: ESOPHAGOGASTRODUODENOSCOPY (EGD) WITH PROPOFOL;  Surgeon: Lucilla Lame, MD;  Location: [facility] ENDOSCOPY;  Service: Endoscopy;  Laterality: N/A;   ESOPHAGOGASTRODUODENOSCOPY (EGD) WITH PROPOFOL N/A 04/24/2019   Procedure: ESOPHAGOGASTRODUODENOSCOPY (EGD) WITH PROPOFOL;  Surgeon: Jonathon Bellows, MD;  Location: [facility] ENDOSCOPY;  Service: Gastroenterology;  Laterality: N/A;   ESOPHAGOGASTRODUODENOSCOPY (EGD) WITH PROPOFOL N/A 01/12/2020   Procedure: ESOPHAGOGASTRODUODENOSCOPY (EGD) WITH PROPOFOL;  Surgeon: Jonathon Bellows, MD;  Location: [facility] ENDOSCOPY;  Service: Gastroenterology;  Laterality: N/A;   ESOPHAGOGASTRODUODENOSCOPY (EGD) WITH PROPOFOL N/A 04/28/2020   Procedure: ESOPHAGOGASTRODUODENOSCOPY (EGD) WITH PROPOFOL;  Surgeon: Jonathon Bellows, MD;  Location: [facility] ENDOSCOPY;  Service: Gastroenterology;  Laterality: N/A;   EUS N/A 05/07/2019   Procedure: FULL UPPER ENDOSCOPIC ULTRASOUND (EUS) RADIAL;  Surgeon: Jola Schmidt, MD;  Location: [facility] ENDOSCOPY;  Service: Endoscopy;  Laterality: N/A;   ILEOSCOPY N/A 07/22/2018   Procedure: ILEOSCOPY THROUGH STOMA;  Surgeon: Virgel Manifold, MD;  Location: [facility] ENDOSCOPY;  Service: Endoscopy;  Laterality: N/A;   ILEOSTOMY     ILEOSTOMY N/A 09/08/2018   Procedure: ILEOSTOMY REVISION POSSIBLE CREATION;  Surgeon: Herbert Pun, MD;  Location: [facility] ORS;  Service: General;  Laterality: N/A;    ILEOSTOMY CLOSURE N/A 08/15/2018   Procedure: DILATION OF ILEOSTOMY STRICTURE;  Surgeon: Herbert Pun, MD;  Location: [facility] ORS;  Service: General;  Laterality: N/A;   LAPAROTOMY Right 05/04/2018   Procedure: EXPLORATORY LAPAROTOMY right colectomy right and left ostomy;  Surgeon: Herbert Pun, MD;  Location: [facility] ORS;  Service: General;  Laterality: Right;   LEFT ATRIAL APPENDAGE OCCLUSION N/A 01/11/2021   Procedure: LEFT ATRIAL APPENDAGE OCCLUSION ([place]); ABORTED PROCEDURE WITHOUT DEVICE BEING IMPLANTED; Location: [facility]; Surgeon: Mylinda Latina, MD   LUNG BIOPSY     MASTECTOMY Left    2000, [place] Right    [place]   XI ROBOTIC ASSISTED COLOSTOMY TAKEDOWN N/A 10/23/2021   Procedure: XI ROBOTIC ASSISTED ILEOSTOMY TAKEDOWN;  Surgeon: Herbert Pun, MD;  Location: [facility] ORS;  Service: General;  Laterality: N/A;  180 minutes for the surgery part please     FAMILY HISTORY   Family History  Problem Relation Age of Onset   Breast cancer Mother 51   Cancer Mother        Breast    Cirrhosis Father    Breast cancer Paternal Aunt 56   Cancer Maternal Aunt  Breast      SOCIAL HISTORY   Social History   Tobacco Use   Smoking status: Former    Packs/day: 0.50    Years: 20.00    Pack years: 10.00    Types: Cigarettes    Quit date: 07/02/2012    Years since quitting: 9.3   Smokeless tobacco: Current    Types: Snuff   Tobacco comments:    quit 2014  Vaping Use   Vaping Use: Never used  Substance Use Topics   Alcohol use: Yes    Comment: Occasionally beer   Drug use: No     MEDICATIONS    Home Medication:    Current Medication:  Current Facility-Administered Medications:    albuterol (PROVENTIL) (2.5 MG/3ML) 0.083% nebulizer solution 3 mL, 3 mL, Nebulization, Q6H PRN, [name]-Diaz, Edgardo, MD, 3 mL at 11/14/21 0737   alum & mag hydroxide-simeth (MAALOX/MYLANTA) 200-200-20 MG/5ML suspension 15-30 mL, 15-30 mL, Oral,  Q4H PRN, Windell Moment, Edgardo, MD, 30 mL at 11/14/21 1500   apixaban (ELIQUIS) tablet 2.5 mg, 2.5 mg, Oral, BID, Dallie Piles, RPH, 2.5 mg at 11/18/21 0845   Chlorhexidine Gluconate Cloth 2 % PADS 6 each, 6 each, Topical, Daily, Herbert Pun, MD, 6 each at 11/18/21 0845   citalopram (CELEXA) tablet 40 mg, 40 mg, Oral, BH-q7a, [name]-Diaz, Reeves Forth, MD, 40 mg at 11/18/21 6387   diltiazem (CARDIZEM CD) 24 hr capsule 240 mg, 240 mg, Oral, Daily, Tang, Lily Michelle, PA-C, 240 mg at 11/18/21 0845   famotidine (PEPCID) tablet 20 mg, 20 mg, Oral, Daily, [name]-Diaz, Edgardo, MD, 20 mg at 11/18/21 0845   feeding supplement (BOOST / RESOURCE BREEZE) liquid 1 Container, 1 Container, Oral, TID BM, [name]-Diaz, Edgardo, MD, 1 Container at 11/18/21 0845   fluticasone furoate-vilanterol (BREO ELLIPTA) 100-25 MCG/ACT 1 puff, 1 puff, Inhalation, Daily, 1 puff at 11/18/21 0845 **AND** umeclidinium bromide (INCRUSE ELLIPTA) 62.5 MCG/ACT 1 puff, 1 puff, Inhalation, Daily, Benita Gutter, RPH, 1 puff at 11/18/21 0846   furosemide (LASIX) injection 40 mg, 40 mg, Intravenous, BID, Breeze, Shantelle, NP, 40 mg at 11/18/21 1702   gabapentin (NEURONTIN) capsule 300 mg, 300 mg, Oral, BID, [name]-Diaz, Edgardo, MD, 300 mg at 11/18/21 0845   HYDROmorphone (DILAUDID) injection 0.5 mg, 0.5 mg, Intravenous, Q2H PRN, Piscoya, Jose, MD, 0.5 mg at 11/09/21 1607   ipratropium-albuterol (DUONEB) 0.5-2.5 (3) MG/3ML nebulizer solution 3 mL, 3 mL, Nebulization, BID, Herbert Pun, MD   lip balm (BLISTEX) ointment, , Topical, PRN, Herbert Pun, MD, Given at 11/06/21 0905   menthol-cetylpyridinium (CEPACOL) lozenge 3 mg, 1 lozenge, Oral, PRN, Herbert Pun, MD, 3 mg at 11/07/21 0522   metoprolol tartrate (LOPRESSOR) injection 5 mg, 5 mg, Intravenous, Q6H PRN, Sharion Settler, NP, 5 mg at 10/31/21 1553   [START ON 11/19/2021] midodrine (PROAMATINE) tablet 5 mg, 5 mg, Oral, TID WC, Veatrice Eckstein,  MD   multivitamin with minerals tablet 1 tablet, 1 tablet, Oral, Daily, [name]-Diaz, Edgardo, MD, 1 tablet at 11/18/21 0846   ondansetron (ZOFRAN-ODT) disintegrating tablet 4 mg, 4 mg, Oral, Q6H PRN, 4 mg at 11/07/21 1255 **OR** ondansetron (ZOFRAN) injection 4 mg, 4 mg, Intravenous, Q6H PRN, Windell Moment, Edgardo, MD, 4 mg at 10/29/21 0051   oxyCODONE-acetaminophen (PERCOCET/ROXICET) 5-325 MG per tablet 1-2 tablet, 1-2 tablet, Oral, Q4H PRN, Herbert Pun, MD, 2 tablet at 11/18/21 1423   sodium chloride (OCEAN) 0.65 % nasal spray 1 spray, 1 spray, Each Nare, PRN, Sharion Settler, NP, 1 spray at 11/12/21 1206   sodium chloride  flush (NS) 0.9 % injection 10-40 mL, 10-40 mL, Intracatheter, Q12H, [name]-Diaz, Edgardo, MD, 10 mL at 11/18/21 0846   sodium chloride flush (NS) 0.9 % injection 10-40 mL, 10-40 mL, Intracatheter, PRN, Herbert Pun, MD   Vitamin D (Ergocalciferol) (DRISDOL) capsule 50,000 Units, 50,000 Units, Oral, Q7 days, Murlean Iba, MD, 50,000 Units at 11/13/21 1308    ALLERGIES   Patient has no known allergies.     REVIEW OF SYSTEMS    Review of Systems:  Gen:  Denies  fever, sweats, chills weigh loss  HEENT: Denies blurred vision, double vision, ear pain, eye pain, hearing loss, nose bleeds, sore throat Cardiac:  No dizziness, chest pain or heaviness, chest tightness,edema Resp:   Denies cough or sputum porduction, shortness of breath,wheezing, hemoptysis,  Gi: Denies swallowing difficulty, stomach pain, nausea or vomiting, diarrhea, constipation, bowel incontinence Gu:  Denies bladder incontinence, burning urine Ext:   Denies Joint pain, stiffness or swelling Skin: Denies  skin rash, easy bruising or bleeding or hives Endoc:  Denies polyuria, polydipsia , polyphagia or weight change Psych:   Denies depression, insomnia or hallucinations   Other:  All other systems negative   VS: BP 101/74 (BP Location: Left Leg)    Pulse 70    Temp 98.6 F (37  C) (Oral)    Resp 18    Ht $R'5\' 6"'St$  (1.676 m)    Wt 62.4 kg    SpO2 94%    BMI 22.19 kg/m      PHYSICAL EXAM    GENERAL:NAD, no fevers, chills, no weakness no fatigue HEAD: Normocephalic, atraumatic.  EYES: Pupils equal, round, reactive to light. Extraocular muscles intact. No scleral icterus.  MOUTH: Moist mucosal membrane. Dentition intact. No abscess noted.  EAR, NOSE, THROAT: Clear without exudates. No external lesions.  NECK: Supple. No thyromegaly. No nodules. No JVD.  PULMONARY: mild rhochi worse on right CARDIOVASCULAR: S1 and S2. Regular rate and rhythm. No murmurs, rubs, or gallops. No edema. Pedal pulses 2+ bilaterally.  GASTROINTESTINAL: Soft, nontender, nondistended. No masses. Positive bowel sounds. No hepatosplenomegaly.  MUSCULOSKELETAL: No swelling, clubbing, or edema. Range of motion full in all extremities.  NEUROLOGIC: Cranial nerves II through XII are intact. No gross focal neurological deficits. Sensation intact. Reflexes intact.  SKIN: No ulceration, lesions, rashes, or cyanosis. Skin warm and dry. Turgor intact.  PSYCHIATRIC: Mood, affect within normal limits. The patient is awake, alert and oriented x 3. Insight, judgment intact.       IMAGING    CT ABDOMEN PELVIS WO CONTRAST  Result Date: 11/05/2021 CLINICAL DATA:  Sepsis, fever, postoperative, rule out intra-abdominal abscess EXAM: CT ABDOMEN AND PELVIS WITHOUT CONTRAST TECHNIQUE: Multidetector CT imaging of the abdomen and pelvis was performed following the standard protocol without IV contrast. Oral enteric contrast was administered. RADIATION DOSE REDUCTION: This exam was performed according to the departmental dose-optimization program which includes automated exposure control, adjustment of the mA and/or kV according to patient size and/or use of iterative reconstruction technique. COMPARISON:  10/27/2021 FINDINGS: Lower chest: Small right pleural effusion associated atelectasis or consolidation.  Three-vessel coronary artery calcifications. Hepatobiliary: No solid liver abnormality is seen. No gallstones, gallbladder wall thickening, or biliary dilatation. Pancreas: Unremarkable. No pancreatic ductal dilatation or surrounding inflammatory changes. Spleen: Normal in size without significant abnormality. Adrenals/Urinary Tract: Adrenal glands are unremarkable. Kidneys are normal, without renal calculi, solid lesion, or hydronephrosis. Bladder is unremarkable. Stomach/Bowel: Stomach is within normal limits. The mid small bowel is diffusely gas and contrast filled, and  mildly distended, largest loops measuring 4.1 cm. Status post right hemicolectomy. Descending and sigmoid diverticula. Vascular/Lymphatic: Aortic atherosclerosis. No enlarged abdominal or pelvic lymph nodes. Reproductive: No mass or other significant abnormality. Other: Anasarca. Persistent, although improved, scattered subcutaneous emphysema about the abdominal wall (series 2, image 38). Unchanged subcutaneous fluid collections of the left and right ventral abdomen, collection on the left measuring 3.4 x 2.2 cm (series 2, image 36), collection on the right measuring 3.5 x 2.0 cm (series 2, image 43). No ascites. Musculoskeletal: No acute or significant osseous findings. IMPRESSION: 1. Unchanged subcutaneous fluid collections of the left and right ventral abdomen, collection on the left measuring 3.4 x 2.2 cm, collection on the right measuring 3.5 x 2.0 cm. These most likely reflect postoperative hematoma or seroma, although presence or absence of infection within this fluid is not established by CT. 2. Status post right hemicolectomy. 3. The mid small bowel is diffusely gas and contrast filled, and mildly distended, largest loops measuring 4.1 cm. Findings are most consistent with postoperative ileus. 4. Right pleural effusion and anasarca. 5. Coronary artery disease. Aortic Atherosclerosis (ICD10-I70.0). Electronically Signed   By: Delanna Ahmadi  M.D.   On: 11/05/2021 17:34   CT ABDOMEN PELVIS WO CONTRAST  Result Date: 10/27/2021 CLINICAL DATA:  Postop day 4 from ileostomy reversal. Vomiting. Evaluate for anastomotic leak. EXAM: CT ABDOMEN AND PELVIS WITHOUT CONTRAST TECHNIQUE: Multidetector CT imaging of the abdomen and pelvis was performed following the standard protocol without IV contrast. RADIATION DOSE REDUCTION: This exam was performed according to the departmental dose-optimization program which includes automated exposure control, adjustment of the mA and/or kV according to patient size and/or use of iterative reconstruction technique. COMPARISON:  Abdominopelvic CT 11/05/2020.  Radiographs 10/26/2021. FINDINGS: Study was performed with the patient in the right lateral decubitus position. Rectal contrast was administered. Lower chest: Apparent right middle lobe collapse with a small amount of adjacent pleural fluid, incompletely visualized. Mild dependent atelectasis in both lower lobes. There is atherosclerosis of the aorta and coronary arteries. Hepatobiliary: Stable low-density liver lesions, likely cysts based on stability. Stable mild biliary dilatation status post cholecystectomy. Pancreas: Unremarkable. No pancreatic ductal dilatation or surrounding inflammatory changes. Spleen: Normal in size without focal abnormality. Adrenals/Urinary Tract: Both adrenal glands appear normal. Both kidneys demonstrate mild perinephric soft tissue stranding, similar to previous study. No evidence of urinary tract calculus or hydronephrosis. The bladder appears unremarkable for its degree of distention. Stomach/Bowel: No oral contrast was administered. Rectal contrast has passed retrograde through the colon and through the ileocolonic anastomosis at the level of the mid transverse colon status post right hemicolectomy. There is retrograde filling of the distal small bowel. No anastomotic leak. The stomach and small bowel are fluid-filled and mildly  distended. No evidence of bowel wall thickening or pneumatosis. Vascular/Lymphatic: There are no enlarged abdominal or pelvic lymph nodes. Diffuse aortic and branch vessel atherosclerosis. Reproductive: Stable appearance of the uterus and adnexa. No adnexal mass. Tubal ligation clips noted. Other: As seen on the postoperative radiographs, there is a large amount of soft tissue emphysema throughout the anterior abdominal wall which extends from the perineum into the presternal soft tissues. There are small focal fluid collections within the right and left anterior abdominal wall, likely related to recent laparoscopic procedure. No free intraperitoneal air, ascites or focal extraluminal fluid collections identified within the peritoneal cavity. Musculoskeletal: No acute or significant osseous findings. Stable chronic mild superior endplate compression fractures at L3 and L5. IMPRESSION: 1. Status post recent  ileostomy reversal and ileocolonic anastomosis. The anastomosis is patent, without leak. 2. Nonspecific gastric and proximal small bowel distension without focal transition point, likely related to a postoperative ileus. Recommend radiographic follow-up. Patient may benefit from a nasogastric tube. 3. As seen on postoperative radiographs, there is extensive soft tissue emphysema throughout the abdominal wall which is likely related to the laparoscopic procedure. Soft tissue infection should be excluded clinically. Small fluid collections in the anterior abdominal wall are attributed to the procedure, and no unexpected collections are identified. There is no pneumoperitoneum or abnormal intra-abdominal fluid collection. 4. Atelectasis at both lung bases with apparent partial right middle lobe collapse. 5. Extensive Aortic Atherosclerosis (ICD10-I70.0). Electronically Signed   By: Richardean Sale M.D.   On: 10/27/2021 16:28   DG Chest 1 View  Result Date: 10/26/2021 CLINICAL DATA:  Hypotension EXAM: CHEST  1  VIEW COMPARISON:  11/16/2020, 06/05/2020, PET CT 12/01/2019, CT chest 11/06/2019, 11/06/2019 FINDINGS: Mild cardiomegaly with central vascular congestion. Small pleural effusions and diffuse increased interstitial opacity likely due to edema. Aortic atherosclerosis. No pneumothorax. Irregular opacity with mild distortion and nodularity in the right upper lobe and right hilus similar compared to intermittent previous exams and presumably due to scarring. IMPRESSION: 1. Cardiomegaly with vascular congestion, small pleural effusions and mild diffuse interstitial edema 2. Irregular and nodular opacities in the right hilus and upper lobe present on previous exams and probably due to scarring Electronically Signed   By: Donavan Foil M.D.   On: 10/26/2021 17:54   DG Chest 2 View  Result Date: 11/18/2021 CLINICAL DATA:  Admitted for acute kidney injury, continued fatigue. Recent pleural effusion on RIGHT. EXAM: CHEST - 2 VIEW COMPARISON:  Chest x-rays dated 11/16/2021, 11/14/2021, 01/20/2020 and 07/31/2020. FINDINGS: Heart size and mediastinal contours are stable. Chronic scarring is again within the RIGHT perihilar lung. Coarse lung markings are seen throughout both lungs. No confluent opacities to suggest superimposed pneumonia. Small bilateral pleural effusions, RIGHT greater than LEFT. No pneumothorax is seen. RIGHT-sided PICC line appears well positioned with tip at the level of the lower SVC/cavoatrial junction. No acute or suspicious osseous abnormality. Extensive aortic atherosclerosis. Mildly distended gas-filled loops of small bowel within the upper abdomen, incompletely imaged. IMPRESSION: 1. Small bilateral pleural effusions, RIGHT greater than LEFT. 2. Coarse lung markings throughout both lungs, compatible with chronic interstitial lung disease/fibrosis. No evidence of superimposed pneumonia or pulmonary edema. 3. Aortic atherosclerosis. 4. Suspect continued postoperative ileus with mildly distended  gas-filled loops of small bowel partially imaged within the upper abdomen. Electronically Signed   By: Franki Cabot M.D.   On: 11/18/2021 12:32   DG Chest 2 View  Result Date: 11/09/2021 CLINICAL DATA:  Shortness of breath.  Mild chest pain.  Smoker. EXAM: CHEST - 2 VIEW COMPARISON:  11/05/2021 FINDINGS: Right PICC tip in the region of the inferior aspect of the superior vena cava without significant change. The nasogastric tube has been removed. Normal sized heart. Stable linear scarring in the right upper lobe. No significant change in a small right pleural effusion and right basilar airspace opacity. Clear left lung. Mildly tortuous and calcified thoracic aorta. No acute bony abnormality. IMPRESSION: Stable small right pleural effusion and right basilar atelectasis and/or pneumonia. Electronically Signed   By: Claudie Revering M.D.   On: 11/09/2021 10:17   DG Abd 1 View  Result Date: 11/03/2021 CLINICAL DATA:  NG tube placement EXAM: ABDOMEN - 1 VIEW COMPARISON:  11/03/2021 11:05 a.m. FINDINGS: Advancement of NG tube with  tip and side port below the GE junction. Redemonstrated small bowel dilatation, not significantly changed from the prior exam. The stomach is not distended. The lower abdomen is not included for evaluation. IMPRESSION: Interval advancement of nasogastric tube, with tip and side port overlying the stomach. Overall unchanged small bowel dilatation in the imaged portion of the abdomen. Electronically Signed   By: Merilyn Baba M.D.   On: 11/03/2021 16:01   DG Abd 1 View  Result Date: 10/28/2021 CLINICAL DATA:  NG tube placement EXAM: ABDOMEN - 1 VIEW COMPARISON:  10/26/2021 FINDINGS: Limited radiograph of the lower chest and upper abdomen was obtained for the purposes of enteric tube localization. Enteric tube is seen coursing below the diaphragm with distal tipterminating within the proximal stomach. Side port terminates within the distal esophagus. Similar degree of gaseous distension of  large and small bowel within the visualized abdomen. IMPRESSION: Enteric tube with distal tip terminating within the proximal stomach, side port terminating within the distal esophagus. Recommend advancement 7-10 cm. Electronically Signed   By: Davina Poke D.O.   On: 10/28/2021 15:48   DG Abd 1 View  Result Date: 10/26/2021 CLINICAL DATA:  Hypotension history of ileostomy take down EXAM: ABDOMEN - 1 VIEW COMPARISON:  11/05/2020 FINDINGS: Clips in the pelvis. Moderate air distension of the stomach. Air-filled dilated small bowel up to 3.5 cm in the central abdomen. Extensive mottled appearance of the abdominal soft tissues presumably due to soft tissue emphysema related to recent laparoscopic procedure. IMPRESSION: 1. Air distension of stomach and small bowel either due to ileus or possible obstruction. 2. Diffuse mottled appearance of soft tissues felt consistent with soft tissue emphysema likely due to recent laparoscopic procedure Electronically Signed   By: Donavan Foil M.D.   On: 10/26/2021 17:51   US RENAL  Result Date: 10/25/2021 CLINICAL DATA:  Acute kidney injury EXAM: RENAL / URINARY TRACT ULTRASOUND COMPLETE COMPARISON:  CT 11/05/2020 FINDINGS: Right Kidney: Renal measurements: 8.9 x 4.1 x 4.2 cm = volume: 79.8 mL. Cortex slightly echogenic. No mass or hydronephrosis Left Kidney: Renal measurements: 7.5 x 4.4 x 4 cm = volume: 88.5 mL. Cortex slightly echogenic. No mass or hydronephrosis. Bladder: Appears normal for degree of bladder distention. Other: None. IMPRESSION: Slight increased cortical echogenicity suggesting medical renal disease. No hydronephrosis. Electronically Signed   By: Donavan Foil M.D.   On: 10/25/2021 16:28   DG Chest Port 1 View  Result Date: 11/16/2021 CLINICAL DATA:  Shortness of breath EXAM: PORTABLE CHEST 1 VIEW COMPARISON:  Previous studies including the examination of 11/14/2021 FINDINGS: Transverse diameter of heart is increased. Central pulmonary vessels are  prominent. Linear densities in the right parahilar region have not changed significantly. Small bilateral pleural effusions are seen, more so on the right side. Crowding of markings in the medial lower lung fields suggest atelectasis/pneumonia. There is no pneumothorax. Tip of PICC line is seen in superior vena cava close to right atrium. IMPRESSION: Cardiomegaly. Central pulmonary vessels are prominent. Prominence of interstitial markings in the right parahilar region and both lower lung fields may suggest interstitial edema or pneumonia. Small bilateral pleural effusions, more so on the right side. Electronically Signed   By: Elmer Picker M.D.   On: 11/16/2021 08:13   DG Chest Port 1 View  Result Date: 11/14/2021 CLINICAL DATA:  Atelectasis, history of small-bowel obstruction EXAM: PORTABLE CHEST 1 VIEW COMPARISON:  11/13/2021 FINDINGS: No significant change in AP portable chest radiograph, with cardiomegaly and right upper extremity PICC. Diffuse  bilateral interstitial pulmonary opacity, small bilateral pleural effusions, and bandlike scarring or atelectasis of the right midlung, unchanged. IMPRESSION: No significant change in AP portable chest radiograph, with cardiomegaly, probable edema, and bilateral pleural effusions. No new airspace opacity. Electronically Signed   By: Delanna Ahmadi M.D.   On: 11/14/2021 09:58   DG Chest Port 1 View  Result Date: 11/13/2021 CLINICAL DATA:  Post right thoracentesis EXAM: PORTABLE CHEST 1 VIEW COMPARISON:  Chest x-ray dated November 12, 2021 FINDINGS: Cardiac and mediastinal contours are unchanged. Linear nodular opacity of the right upper lobe, unchanged from prior exam. No new parenchymal opacity. Interval decreased size of small right pleural effusion. No evidence pneumothorax. IMPRESSION: No evidence of pneumothorax. Electronically Signed   By: Yetta Glassman M.D.   On: 11/13/2021 11:46   DG Chest Port 1 View  Result Date: 11/12/2021 CLINICAL DATA:   Acute respiratory failure with hypoxia. EXAM: PORTABLE CHEST 1 VIEW COMPARISON:  November 09, 2021 FINDINGS: Right upper extremity PICC with tip near the superior cavoatrial junction. The heart size and mediastinal contours are within normal limits. Stable linear scarring in the right upper lobe. Slightly increased small right pleural effusion with adjacent airspace opacities. Increased conspicuity of the irregular nodular areas of consolidation in the right mid lung. The visualized skeletal structures are unremarkable. IMPRESSION: 1. Slightly increased small right pleural effusion with adjacent airspace opacities. 2. Increased conspicuity of the irregular nodular areas of consolidation in the right mid lung. Electronically Signed   By: Dahlia Bailiff M.D.   On: 11/12/2021 08:55   DG Chest Port 1 View  Result Date: 11/05/2021 CLINICAL DATA:  Fever. EXAM: PORTABLE CHEST 1 VIEW COMPARISON:  Chest radiograph 10/26/2021. FINDINGS: Right upper extremity PICC line tip projects over the superior vena cava. Enteric tube is present. The side port is at the level of the distal esophagus. Stable enlarged cardiac and mediastinal contours. Similar-appearing distortion and consolidation within the mid right lung. Bibasilar atelectasis, right-greater-than-left. Probable small right pleural effusion. No pneumothorax. IMPRESSION: Enteric tube side-port at the distal esophagus, recommend advancement. Right upper extremity PICC line tip projects over the superior vena cava. Similar-appearing irregular and nodular areas of consolidation within the right mid lung. These results will be called to the ordering clinician or representative by the Radiologist Assistant, and communication documented in the PACS or [employer]. Electronically Signed   By: Lovey Newcomer M.D.   On: 11/05/2021 08:04   DG Abd 2 Views  Result Date: 11/06/2021 CLINICAL DATA:  Abdominal pain EXAM: ABDOMEN - 2 VIEW COMPARISON:  Abdominal x-ray 11/03/2021  FINDINGS: Enteric tube identified with the tip in the lower esophagus. Mildly distended loops of small bowel are again seen throughout the abdomen measuring up to 3.3 cm in diameter. No free air identified on the decubitus view. No suspicious calcifications identified. IMPRESSION: 1. Enteric tube tip is in the lower esophagus, advancement recommended. 2. Mildly distended loops of bowel throughout the abdomen which could be seen with ileus or obstruction. Continued follow-up recommended. Electronically Signed   By: Ofilia Neas M.D.   On: 11/06/2021 10:42   DG Abd 2 Views  Result Date: 11/03/2021 CLINICAL DATA:  Abdominal distention EXAM: ABDOMEN - 2 VIEW COMPARISON:  Previous studies including the examination of 11/02/2021 FINDINGS: There is interval worsening of small bowel dilation measuring up to 4.9 cm in diameter. Tip enteric tube is noted at the gastroesophageal junction pointing cephalad. There is no distention of stomach. There is interval passage of contrast from  the colon. There is evidence of previous tubal ligation. IMPRESSION: There is interval increase in small bowel dilation suggesting high-grade small bowel obstruction. Tip of enteric tube is at the gastroesophageal junction pointing cephalad. NG tube should be advanced 5-10 cm to place the tip and side port within the stomach. Electronically Signed   By: Elmer Picker M.D.   On: 11/03/2021 12:36   DG Abd 2 Views  Result Date: 10/31/2021 CLINICAL DATA:  Ileus following gastrointestinal surgery, colostomy takedown 10/23/2021 EXAM: ABDOMEN - 2 VIEW COMPARISON:  10/28/2021 FINDINGS: Nasogastric tube in proximal stomach. Persistent dilatation of small bowel loops in the abdomen without wall thickening. Small amount retained contrast in colon. Levoconvex lumbar scoliosis. Foci of soft tissue gas at RIGHT lateral abdominal wall consistent with preceding surgery. Atelectasis at RIGHT lung base. IMPRESSION: Persistent small bowel ileus.  Electronically Signed   By: Lavonia Dana M.D.   On: 10/31/2021 10:59   DG Abd Portable 1V-Small Bowel Obstruction Protocol-initial, 8 hr delay  Result Date: 11/02/2021 CLINICAL DATA:  8 hour follow-up small-bowel delay EXAM: PORTABLE ABDOMEN - 1 VIEW COMPARISON:  Film from the previous day. FINDINGS: Gastric catheter is noted within the stomach. Contrast material administered now lies within the dilated small bowel as well as within the colon. These changes are consistent with a partial small bowel obstruction. No free air is noted. IMPRESSION: Passage of contrast into the colon consistent with a partial small bowel obstruction. Electronically Signed   By: Inez Catalina M.D.   On: 11/02/2021 03:13   DG Abd Portable 1V  Result Date: 11/01/2021 CLINICAL DATA:  Enteric catheter placement EXAM: PORTABLE ABDOMEN - 1 VIEW COMPARISON:  10/31/2021 FINDINGS: Frontal view of the lower chest and upper abdomen demonstrates enteric catheter tip and side port projecting over the gastric fundus. Right-sided PICC tip overlies atriocaval junction. Cardiac silhouette is enlarged. Small right pleural effusion. Stable vascular congestion. Stable distended gas-filled loops of small bowel, likely ileus given recent surgery. IMPRESSION: 1. Enteric catheter projecting over gastric fundus. 2. Stable small bowel dilatation consistent with ileus. Electronically Signed   By: Randa Ngo M.D.   On: 11/01/2021 16:02   ECHOCARDIOGRAM COMPLETE  Result Date: 10/27/2021    ECHOCARDIOGRAM REPORT   Patient Name:   Danielle [name] Date of Exam: 10/27/2021 Medical Rec #:  [id number]       Height:       66.0 in Accession #:    [id number]      Weight:       124.0 lb Date of Birth:  12-Feb-1956       BSA:          28.632 m Patient Age:    16 years        BP:           144/84 mmHg Patient Gender: F               HR:           95 bpm. Exam Location:  [facility] Procedure: 2D Echo, Cardiac Doppler and Color Doppler Indications:     Elevated troponin   History:         Patient has prior history of Echocardiogram examinations, most                  recent 11/07/2019. COPD, Arrythmias:Atrial Fibrillation,                  Signs/Symptoms:Murmur; Risk Factors:Hypertension.  Sonographer:  Sherrie Sport Referring Phys:  Alexander Diagnosing Phys: Donnelly Angelica IMPRESSIONS  1. Left ventricular ejection fraction, by estimation, is 60 to 65%. The left ventricle has normal function. The left ventricle has no regional wall motion abnormalities. Left ventricular diastolic parameters are consistent with Grade I diastolic dysfunction (impaired relaxation).  2. Right ventricular systolic function is normal. The right ventricular size is mildly enlarged.  3. Right atrial size was mildly dilated.  4. The mitral valve is normal in structure. Moderate mitral valve regurgitation. No evidence of mitral stenosis.  5. Tricuspid valve regurgitation is moderate.  6. The aortic valve is calcified. Aortic valve regurgitation is not visualized. Mild aortic valve stenosis.  7. The inferior vena cava is normal in size with greater than 50% respiratory variability, suggesting right atrial pressure of 3 mmHg. Conclusion(s)/Recommendation(s): CALCIFIED AORTIC VALVE WITH MILD AS. VMax 2.1 m/s. Dimensionless index 0.4. FINDINGS  Left Ventricle: Left ventricular ejection fraction, by estimation, is 60 to 65%. The left ventricle has normal function. The left ventricle has no regional wall motion abnormalities. The left ventricular internal cavity size was normal in size. There is  no left ventricular hypertrophy. Left ventricular diastolic parameters are consistent with Grade I diastolic dysfunction (impaired relaxation). Right Ventricle: The right ventricular size is mildly enlarged. No increase in right ventricular wall thickness. Right ventricular systolic function is normal. Left Atrium: Left atrial size was normal in size. Right Atrium: Right atrial size was mildly dilated. Pericardium:  There is no evidence of pericardial effusion. Mitral Valve: The mitral valve is normal in structure. Moderate mitral valve regurgitation. No evidence of mitral valve stenosis. MV peak gradient, 3.1 mmHg. The mean mitral valve gradient is 1.0 mmHg. Tricuspid Valve: The tricuspid valve is normal in structure. Tricuspid valve regurgitation is moderate . No evidence of tricuspid stenosis. Aortic Valve: The aortic valve is calcified. Aortic valve regurgitation is not visualized. Mild aortic stenosis is present. Aortic valve mean gradient measures 9.4 mmHg. Aortic valve peak gradient measures 16.8 mmHg. Aortic valve area, by VTI measures 1.21 cm. Pulmonic Valve: The pulmonic valve was not well visualized. Pulmonic valve regurgitation is not visualized. No evidence of pulmonic stenosis. Aorta: The aortic root is normal in size and structure. Venous: The inferior vena cava is normal in size with greater than 50% respiratory variability, suggesting right atrial pressure of 3 mmHg. IAS/Shunts: No atrial level shunt detected by color flow Doppler.  LEFT VENTRICLE PLAX 2D LVIDd:         4.80 cm   Diastology LVIDs:         3.00 cm   LV e' medial:    5.87 cm/s LV PW:         1.10 cm   LV E/e' medial:  13.9 LV IVS:        0.75 cm   LV e' lateral:   12.40 cm/s LVOT diam:     2.00 cm   LV E/e' lateral: 6.6 LV SV:         45 LV SV Index:   28 LVOT Area:     3.14 cm  RIGHT VENTRICLE RV Basal diam:  3.80 cm RV S prime:     12.20 cm/s TAPSE (M-mode): 1.8 cm LEFT ATRIUM             Index        RIGHT ATRIUM           Index LA diam:  3.20 cm 1.96 cm/m   RA Area:     19.30 cm LA Vol (A2C):   58.8 ml 36.03 ml/m  RA Volume:   63.90 ml  39.15 ml/m LA Vol (A4C):   38.7 ml 23.71 ml/m LA Biplane Vol: 49.1 ml 30.08 ml/m  AORTIC VALVE                     PULMONIC VALVE AV Area (Vmax):    1.04 cm      PV Vmax:        0.59 m/s AV Area (Vmean):   0.91 cm      PV Vmean:       35.200 cm/s AV Area (VTI):     1.21 cm      PV VTI:          0.109 m AV Vmax:           205.20 cm/s   PV Peak grad:   1.4 mmHg AV Vmean:          140.200 cm/s  PV Mean grad:   1.0 mmHg AV VTI:            0.373 m       RVOT Peak grad: 4 mmHg AV Peak Grad:      16.8 mmHg AV Mean Grad:      9.4 mmHg LVOT Vmax:         67.90 cm/s LVOT Vmean:        40.500 cm/s LVOT VTI:          0.144 m LVOT/AV VTI ratio: 0.39  AORTA Ao Root diam: 3.17 cm MITRAL VALVE               TRICUSPID VALVE MV Area (PHT): 4.86 cm    TR Peak grad:   52.4 mmHg MV Area VTI:   3.01 cm    TR Vmax:        362.00 cm/s MV Peak grad:  3.1 mmHg MV Mean grad:  1.0 mmHg    SHUNTS MV Vmax:       0.88 m/s    Systemic VTI:  0.14 m MV Vmean:      55.0 cm/s   Systemic Diam: 2.00 cm MV Decel Time: 156 msec    Pulmonic VTI:  0.157 m MV E velocity: 81.40 cm/s MV A velocity: 51.40 cm/s MV E/A ratio:  1.58 Donnelly Angelica Electronically signed by Donnelly Angelica Signature Date/Time: 10/27/2021/4:07:10 PM    Final    [place] EKG SITE RITE  Result Date: 11/01/2021 If Site Rite image not attached, placement could not be confirmed due to current cardiac rhythm.  US THORACENTESIS ASP PLEURAL SPACE W/IMG GUIDE  Result Date: 11/13/2021 INDICATION: Right pleural effusion with shortness breath request received for diagnostic and therapeutic thoracentesis. EXAM: ULTRASOUND GUIDED RIGHT THORACENTESIS MEDICATIONS: Local 1% lidocaine only. COMPLICATIONS: None immediate. PROCEDURE: An ultrasound guided thoracentesis was thoroughly discussed with the patient and questions answered. The benefits, risks, alternatives and complications were also discussed. The patient understands and wishes to proceed with the procedure. Written consent was obtained. Ultrasound was performed to localize and mark an adequate pocket of fluid in the right chest. The area was then prepped and draped in the normal sterile fashion. 1% Lidocaine was used for local anesthesia. Under ultrasound guidance a 19 gauge, 7-cm, Yueh catheter was introduced. Thoracentesis was  performed. The catheter was removed and a dressing applied. FINDINGS: A total of approximately 1 L of amber  colored fluid was removed. Samples were sent to the laboratory as requested by the clinical team. IMPRESSION: Successful ultrasound guided right thoracentesis yielding 1 L of pleural fluid. This exam was performed by Tsosie Billing PA-C, and was supervised and interpreted by Dr. Denna Haggard. Electronically Signed   By: Albin Felling M.D.   On: 11/13/2021 12:05       ASSESSMENT/PLAN   Acute on chronic hypoxemic respiratory failure   - Due to moderate right pleural effusion with associated right lower lobe complete atelectasis   -s/p thoracentesis - 1 L - aspirated -                 -patient would benefit from diuresis - nephrology on case appreciate collaboration  Moderate pleural effusion   S/p aspiration - fluid pattern studies in process     Suspect transudative fluid due to CKD - Noted nephrology on case - appreciate input   Right lower lobe atelectasis   - patient would benefit from recruitment maneuvers    - will order RT Metaneb with albuterol BID    - Incentive spirometry and flutter valve PRN  CKD   - likely culprit with fluid overload contributing to pulmonary and gut edema   Atrial fibrillation - chronic - potential for rapid ventricular response due to albuterol/ipratropium therapy     Thank you for allowing me to participate in the care of this patient.    Patient/Family are satisfied with care plan and all questions have been answered.  This document was prepared using Dragon voice recognition software and may include unintentional dictation errors.     Ottie Glazier, M.D.  Division of Pulmonary & [place]         .fa

## 2021-11-18 NOTE — Progress Notes (Signed)
Mobility Specialist - Progress Note   11/18/21 1500  Mobility  Activity Ambulated with assistance in hallway;Stood at bedside;Dangled on edge of bed  Level of Assistance Standby assist, set-up cues, supervision of patient - no hands on  Assistive Device Standard walker  Distance Ambulated (ft) 100 ft  Activity Response Tolerated well  $Mobility charge 1 Mobility     Pre-mobility: 104 HR,85% SpO2 During mobility: 108 -127 HR, 88% SpO2  Pt was sitting in bed upon arrival on 2L, O2 sitting at 84% on arrival. Encouragement needed for participation with session. Bumped to 4L for activity. Pt sat EOB independently. Pt ambulated in hallway 128ft with 3 rest breaks.Max HR 127 BPM, winded with activity. Pt returned to bed with needs in reach.  Kathee Delton Mobility Specialist 11/18/21, 3:33 PM

## 2021-11-18 NOTE — Progress Notes (Signed)
[facility]  PROGRESS NOTE   Subjective:     Objective:  Vital signs in last 24 hours:  Temp:  [98 F (36.7 C)-98.6 F (37 C)] 98.6 F (37 C) (02/04 0809) Pulse Rate:  [85-104] 104 (02/04 0809) Resp:  [18-20] 18 (02/04 0809) BP: (100-118)/(55-83) 105/83 (02/04 0809) SpO2:  [90 %-97 %] 93 % (02/04 0809) Weight:  [62.4 kg] 62.4 kg (02/04 0500)  Weight change: 0.069 kg Filed Weights   11/16/21 0500 11/17/21 0449 11/18/21 0500  Weight: 61.8 kg 62.3 kg 62.4 kg    Intake/Output: I/O last 3 completed shifts: In: 660 [P.O.:660] Out: 300 [Urine:300]   Intake/Output this shift:  No intake/output data recorded.  Physical Exam: General:  No acute distress  Head:  Normocephalic, atraumatic. Moist oral mucosal membranes  Eyes:  Anicteric  Neck:  Supple  Lungs:   Clear to auscultation, normal effort  Heart:  S1S2 no rubs  Abdomen:   Soft, nontender, bowel sounds present  Extremities:  peripheral edema.  Neurologic:  Awake, alert, following commands  Skin:  No lesions  Access:     Basic Metabolic Panel: Recent Labs  Lab 11/12/21 1000 11/15/21 1027 11/16/21 0506 11/17/21 0511 11/18/21 0510  NA 135 137 141 138 138  K 3.7 3.2* 3.8 4.6 5.4*  CL 104 100 103 101 100  CO2 23 30 31 29  32  GLUCOSE 122* 120* 84 79 96  BUN 55* 23 22 22 23   CREATININE 2.25* 1.78* 1.75* 1.87* 1.98*  CALCIUM 8.3* 8.5* 8.5* 8.6* 8.8*  MG  --   --   --  1.3* 2.0  PHOS  --   --   --  3.8  --     CBC: Recent Labs  Lab 11/13/21 0445 11/15/21 1027 11/16/21 0506 11/17/21 0511 11/18/21 0510  WBC 7.4 7.9 6.8 7.2 8.8  HGB 7.5* 8.0* 7.7* 7.9* 7.9*  HCT 23.1* 24.8* 24.3* 24.3* 24.6*  MCV 92.4 94.7 95.7 94.9 94.6  PLT 330 321 361 317 337     Urinalysis: No results for input(s): COLORURINE, LABSPEC, PHURINE, GLUCOSEU, HGBUR, BILIRUBINUR, KETONESUR, PROTEINUR, UROBILINOGEN, NITRITE, LEUKOCYTESUR in the last 72 hours.  Invalid input(s): APPERANCEUR    Imaging: DG Chest 2  View  Result Date: 11/18/2021 CLINICAL DATA:  Admitted for acute kidney injury, continued fatigue. Recent pleural effusion on RIGHT. EXAM: CHEST - 2 VIEW COMPARISON:  Chest x-rays dated 11/16/2021, 11/14/2021, 01/20/2020 and 07/31/2020. FINDINGS: Heart size and mediastinal contours are stable. Chronic scarring is again within the RIGHT perihilar lung. Coarse lung markings are seen throughout both lungs. No confluent opacities to suggest superimposed pneumonia. Small bilateral pleural effusions, RIGHT greater than LEFT. No pneumothorax is seen. RIGHT-sided PICC line appears well positioned with tip at the level of the lower SVC/cavoatrial junction. No acute or suspicious osseous abnormality. Extensive aortic atherosclerosis. Mildly distended gas-filled loops of small bowel within the upper abdomen, incompletely imaged. IMPRESSION: 1. Small bilateral pleural effusions, RIGHT greater than LEFT. 2. Coarse lung markings throughout both lungs, compatible with chronic interstitial lung disease/fibrosis. No evidence of superimposed pneumonia or pulmonary edema. 3. Aortic atherosclerosis. 4. Suspect continued postoperative ileus with mildly distended gas-filled loops of small bowel partially imaged within the upper abdomen. Electronically Signed   By: Franki Cabot M.D.   On: 11/18/2021 12:32     Medications:     apixaban  2.5 mg Oral BID   Chlorhexidine Gluconate Cloth  6 each Topical Daily   citalopram  40 mg Oral [id number]  diltiazem  240 mg Oral Daily   famotidine  20 mg Oral Daily   feeding supplement  1 Container Oral TID BM   fluticasone furoate-vilanterol  1 puff Inhalation Daily   And   umeclidinium bromide  1 puff Inhalation Daily   furosemide  40 mg Intravenous BID   gabapentin  300 mg Oral BID   ipratropium-albuterol  3 mL Nebulization BID   multivitamin with minerals  1 tablet Oral Daily   pantoprazole  40 mg Oral QHS   sodium chloride flush  10-40 mL Intracatheter Q12H   Vitamin D  (Ergocalciferol)  50,000 Units Oral Q7 days    Assessment/ Plan:     Principal Problem:   Ileostomy status ([place]) Active Problems:   Hyponatremia   Acute respiratory failure with hypoxia (HCC)   Weakness   Acute kidney injury superimposed on CKD (HCC)   HTN (hypertension)   AF (paroxysmal atrial fibrillation) (HCC)   Normocytic anemia   Depression   Pleural effusion due to CHF (congestive heart failure) (HCC)   Hypokalemia   Acute on chronic combined systolic and diastolic CHF (congestive heart failure) (HCC)   Hyperphosphatemia   Hypomagnesemia   Anemia of chronic disease   Ileus following gastrointestinal surgery (HCC)   Aspiration pneumonia of right lower lobe due to gastric secretions ([place])  66 year old female with history of hypertension, coronary artery disease, atrial fibrillation, COPD, chronic kidney disease stage IIIb and history of ileostomy now s/p ileostomy reversal.  Postoperative course complicated by ileus development and acute kidney injury on the top of chronic kidney disease.  She also developed anemia requiring 2 units of blood transfusion.  Patient is presently hemodynamically stable and is able to ambulate with assistance.  #1: Acute kidney injury on CKD: Renal indicis are improving slowly but steadily.  #2: Anemia: Patient may have anemia secondary to chronic kidney disease.  She had acute drop in hemoglobin and had 2 units of blood transfusion.  #3: Reversal of ileostomy: Presently stable.  #4: Hypertension/CHF: Continue present antihypertensive medications.  Patient is advised to stay on 2 g salt restricted diet with 1 L fluid restriction.  Continue furosemide as ordered.  We will continue to monitor closely.   LOS: [place], [facility] 2/4/20231:32 PM

## 2021-11-18 NOTE — Progress Notes (Addendum)
°  Progress Note   Patient: Danielle [name] NID:[id number] DOB: [birth year] DOA: 10/23/2021     26 DOS: the patient was seen and examined on 11/18/2021   Brief hospital course: 66 year old woman admitted for elective ileostomy takedown, hospital course complicated by prolonged postoperative ileus.  Hospitalist service consulted for medical management.  Treated for AKI, aspiration pneumonia, acute on chronic systolic and diastolic CHF.  Assessment and Plan: Ileus following reversible of ileostomy ([place]) --Resolved  AKI on CKD stage IV appears to be stable --Management per nephrology.  Appears stable.  Anemia of chronic disease -- Hemoglobin stable, 7.9.  Acute on chronic combined systolic and diastolic CHF (congestive heart failure) ([place]) --Previous EF 35 to 40% in 2019, since recovered, 60-65% this admission with grade I DD. --Compensated at this point.  I/O not accurate.  Continue diuretics as per nephrology.  AF (paroxysmal atrial fibrillation) ([place])- (present on admission) -Continue Eliquis, diltiazem, metoprolol  Acute respiratory failure with hypoxia ([facility]) --Seen by pulmonology, thought secondary to right sided pleural effusion  Aspiration pneumonia of right lower lobe due to gastric secretions ([place]) --resolved.  Treated with antibiotics.    Subjective:  Feels ok, eating ok Breathing ok but gets short-winded walking  Physical Exam: Vitals:   11/17/21 2116 11/18/21 0430 11/18/21 0500 11/18/21 0809  BP: 103/67 100/76  105/83  Pulse: 93 98  (!) 104  Resp: 18 20  18   Temp: 98 F (36.7 C) 98.5 F (36.9 C)  98.6 F (37 C)  TempSrc: Oral   Oral  SpO2: 95% 97%  93%  Weight:   62.4 kg   Height:       Physical Exam Constitutional:      General: She is not in acute distress.    Appearance: She is not ill-appearing or toxic-appearing.  Cardiovascular:     Rate and Rhythm: Normal rate and regular rhythm.     Comments: Telemetry SR, ST Pulmonary:     Effort: Pulmonary  effort is normal. No respiratory distress.     Breath sounds: No wheezing or rales.  Neurological:     General: No focal deficit present.     Mental Status: She is alert.  Psychiatric:        Mood and Affect: Mood normal.        Behavior: Behavior normal.     Data Reviewed:  K+ 5.4, creatinine 1.98, Hgb 7.9   Family Communication: niece at bedside  Disposition: Status is: Inpatient      Time spent: 35 minutes  Author: Murray Hodgkins, MD 11/18/2021 12:49 PM  For on call review [email or web address].

## 2021-11-18 NOTE — Plan of Care (Signed)
  Problem: Health Behavior/Discharge Planning: Goal: Ability to manage health-related needs will improve Outcome: Progressing   

## 2021-11-19 LAB — RENAL FUNCTION PANEL
Albumin: 2.9 g/dL — ABNORMAL LOW (ref 3.5–5.0)
Anion gap: 8 (ref 5–15)
BUN: 23 mg/dL (ref 8–23)
CO2: 29 mmol/L (ref 22–32)
Calcium: 8.9 mg/dL (ref 8.9–10.3)
Chloride: 97 mmol/L — ABNORMAL LOW (ref 98–111)
Creatinine, Ser: 2.15 mg/dL — ABNORMAL HIGH (ref 0.44–1.00)
GFR, Estimated: 25 mL/min — ABNORMAL LOW (ref >60)
Glucose, Bld: 85 mg/dL (ref 70–99)
Phosphorus: 4.6 mg/dL (ref 2.5–4.6)
Potassium: 4.6 mmol/L (ref 3.5–5.1)
Sodium: 134 mmol/L — ABNORMAL LOW (ref 135–145)

## 2021-11-19 LAB — GLUCOSE, CAPILLARY: Glucose-Capillary: 80 mg/dL (ref 70–99)

## 2021-11-19 MED ORDER — IPRATROPIUM-ALBUTEROL 0.5-2.5 (3) MG/3ML IN SOLN: 3.0000 mL | Freq: Four times a day (QID) | RESPIRATORY_TRACT | Status: DC | PRN

## 2021-11-19 MED ORDER — DILTIAZEM HCL ER COATED BEADS 240 MG PO CP24: 240.0000 mg | ORAL_CAPSULE | Freq: Every day | ORAL | 0 refills | Status: DC

## 2021-11-19 NOTE — Progress Notes (Signed)
PULMONOLOGY         Date: 11/19/2021,   MRN# [id number] Danielle [name] 05-19-1956     AdmissionWeight: 56.2 kg                 CurrentWeight: 62.4 kg   Referring physician: Dr Peyton Najjar   CHIEF COMPLAINT:   Increased O2 requirement.   HISTORY OF PRESENT ILLNESS   This is a pleasant 66 yo with complex comorbid history including, COPD, AF, HTN, CKD3, s/p ileostomy reversal with persistent increase O2 req and notable pleural effusion. She is s/p thoracentesis yielding 1L fluid drained from right pleural space.  She is on supplemental O2 and feels improved.    11/14/21 - patient received VEST therapy yesterday with RT and felt that it may be helping. We have repeat CXR scheduled for today. She took few breaths on IS up to 642mL tidal volume. We discussed importance of PT/OT and she agrees to work with them for strength training. Yesterday she had big BM during my visit along with physical therapist. She remains on 3.5L/min and Ive discussed with RN to wean that down as able with goal spO2 >88%.  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     11/16/21- patient is further improved with O2 weaned to 2L/min during my evaluation today.  11/18/21- patient still hospitalized and has increased O2 requirement at 3L/min now.  She cannot walk without desaturation.  I think this is related to fluid balance and renal failure. CXR with bilateral effusions and pulmonary edema.  Renal function is worse this am.  Im gonna increase her blood pressure today and this should help with diuresis and we should continue PT to treat atelectatic segments.   11/19/21- patient is about same as yesterday, clinically however she does seem improved.    PAST MEDICAL HISTORY   Past Medical History:  Diagnosis Date   Anemia    Anxiety    Aortic atherosclerosis ([place])    Aortic valve stenosis 02/10/2018   a.) TTE  02/10/2018: EF 55-60%: mild AS with MPG of 12 mmHg. b.) TTE 04/21/2018: EF 35-40%; mild AS with MPG 8 mmHg. c.) TTE 11/07/2019: EF 50-55%; mild AS with MPG 10 mmHg.   Arthritis    Atrial fibrillation (HCC)    a.) CHA2DS2-VASc = 4 (age, sex, HTN, aortic plaque). b.) Rate/rhythm maintained on oral carvedilol; chronically anticoagulated on dose reduced apixaban. c.)  Attempted deployment of LAA occlusive device on 01/11/2021; parameters failed and procedure aborted.   Breast cancer, left ([place]) 2000   a.) T2N1M0; ER/PR (+) --> Tx'd with total mastectomy, LN resection, XRT, and chemotherapy   Cancer of right lung ([place]) 07/30/2016   a.) adenocarcinoma; ALK, ROS1, PDL1, BRAF, EGFR all negative.   CKD (chronic kidney disease), stage IV (HCC)    COPD (chronic obstructive pulmonary disease) (HCC)    Dependence on supplemental oxygen    Depression    Diastolic dysfunction 54/98/2641   a.) TTE 02/10/2017: EF 55-60%; G2DD. b.) TTE 04/21/2018: EF 35-40%; mild LA dilation; mod MV regurgitation. c.) TTE 11/07/2019: EF 50-55%; G1DD.   DOE (dyspnea on exertion)    GERD (gastroesophageal reflux disease)    Heart murmur    History of 2019 novel coronavirus disease (COVID-19) 10/14/2019   HLD (hyperlipidemia)    Hypertension    Long term current use of anticoagulant    a.) apixaban   Lymphedema  Personal history of chemotherapy    Personal history of radiation therapy    SBO (small bowel obstruction) ([place]) 11/05/2020   Vitamin D deficiency      SURGICAL HISTORY   Past Surgical History:  Procedure Laterality Date   Breast Biospy Left    [facility]   BREAST SURGERY     COLONOSCOPY N/A 04/30/2018   Procedure: COLONOSCOPY;  Surgeon: Virgel Manifold, MD;  Location: [facility] ENDOSCOPY;  Service: Endoscopy;  Laterality: N/A;   COLONOSCOPY N/A 07/22/2018   Procedure: COLONOSCOPY;  Surgeon: Virgel Manifold, MD;  Location: [facility] ENDOSCOPY;  Service: Endoscopy;  Laterality: N/A;   COLONOSCOPY WITH PROPOFOL  N/A 09/21/2021   Procedure: COLONOSCOPY WITH PROPOFOL;  Surgeon: Benjamine Sprague, DO;  Location: [facility] ENDOSCOPY;  Service: General;  Laterality: N/A;   DILATION AND CURETTAGE OF UTERUS     ELECTROMAGNETIC NAVIGATION BROCHOSCOPY Right 04/11/2016   Procedure: ELECTROMAGNETIC NAVIGATION BRONCHOSCOPY;  Surgeon: Vilinda Boehringer, MD;  Location: [facility] ORS;  Service: Cardiopulmonary;  Laterality: Right;   ESOPHAGOGASTRODUODENOSCOPY N/A 07/22/2018   Procedure: ESOPHAGOGASTRODUODENOSCOPY (EGD);  Surgeon: Virgel Manifold, MD;  Location[facility] ENDOSCOPY;  Service: Endoscopy;  Laterality: N/A;   ESOPHAGOGASTRODUODENOSCOPY (EGD) WITH PROPOFOL N/A 05/07/2018   Procedure: ESOPHAGOGASTRODUODENOSCOPY (EGD) WITH PROPOFOL;  Surgeon: Lucilla Lame, MD;  Location: [facility] ENDOSCOPY;  Service: Endoscopy;  Laterality: N/A;   ESOPHAGOGASTRODUODENOSCOPY (EGD) WITH PROPOFOL N/A 04/24/2019   Procedure: ESOPHAGOGASTRODUODENOSCOPY (EGD) WITH PROPOFOL;  Surgeon: Jonathon Bellows, MD;  Location: [facility] ENDOSCOPY;  Service: Gastroenterology;  Laterality: N/A;   ESOPHAGOGASTRODUODENOSCOPY (EGD) WITH PROPOFOL N/A 01/12/2020   Procedure: ESOPHAGOGASTRODUODENOSCOPY (EGD) WITH PROPOFOL;  Surgeon: Jonathon Bellows, MD;  Location: [facility] ENDOSCOPY;  Service: Gastroenterology;  Laterality: N/A;   ESOPHAGOGASTRODUODENOSCOPY (EGD) WITH PROPOFOL N/A 04/28/2020   Procedure: ESOPHAGOGASTRODUODENOSCOPY (EGD) WITH PROPOFOL;  Surgeon: Jonathon Bellows, MD;  Location: [facility] ENDOSCOPY;  Service: Gastroenterology;  Laterality: N/A;   EUS N/A 05/07/2019   Procedure: FULL UPPER ENDOSCOPIC ULTRASOUND (EUS) RADIAL;  Surgeon: Jola Schmidt, MD;  Location: [facility] ENDOSCOPY;  Service: Endoscopy;  Laterality: N/A;   ILEOSCOPY N/A 07/22/2018   Procedure: ILEOSCOPY THROUGH STOMA;  Surgeon: Virgel Manifold, MD;  Location: [facility] ENDOSCOPY;  Service: Endoscopy;  Laterality: N/A;   ILEOSTOMY     ILEOSTOMY N/A 09/08/2018   Procedure: ILEOSTOMY REVISION POSSIBLE CREATION;  Surgeon:  Herbert Pun, MD;  Location: [facility] ORS;  Service: General;  Laterality: N/A;   ILEOSTOMY CLOSURE N/A 08/15/2018   Procedure: DILATION OF ILEOSTOMY STRICTURE;  Surgeon: Herbert Pun, MD;  Location: [facility] ORS;  Service: General;  Laterality: N/A;   LAPAROTOMY Right 05/04/2018   Procedure: EXPLORATORY LAPAROTOMY right colectomy right and left ostomy;  Surgeon: Herbert Pun, MD;  Location: [facility] ORS;  Service: General;  Laterality: Right;   LEFT ATRIAL APPENDAGE OCCLUSION N/A 01/11/2021   Procedure: LEFT ATRIAL APPENDAGE OCCLUSION ([place]); ABORTED PROCEDURE WITHOUT DEVICE BEING IMPLANTED; Location: [facility]; Surgeon: Mylinda Latina, MD   LUNG BIOPSY     MASTECTOMY Left    2000, [place] Right    [place]   XI ROBOTIC ASSISTED COLOSTOMY TAKEDOWN N/A 10/23/2021   Procedure: XI ROBOTIC ASSISTED ILEOSTOMY TAKEDOWN;  Surgeon: Herbert Pun, MD;  Location: [facility] ORS;  Service: General;  Laterality: N/A;  180 minutes for the surgery part please     FAMILY HISTORY   Family History  Problem Relation Age of Onset   Breast cancer Mother 61   Cancer Mother        Breast    Cirrhosis Father  Breast cancer Paternal Aunt 41   Cancer Maternal Aunt        Breast      SOCIAL HISTORY   Social History   Tobacco Use   Smoking status: Former    Packs/day: 0.50    Years: 20.00    Pack years: 10.00    Types: Cigarettes    Quit date: 07/02/2012    Years since quitting: 9.3   Smokeless tobacco: Current    Types: Snuff   Tobacco comments:    quit 2014  Vaping Use   Vaping Use: Never used  Substance Use Topics   Alcohol use: Yes    Comment: Occasionally beer   Drug use: No     MEDICATIONS    Home Medication:    Current Medication:  Current Facility-Administered Medications:    albuterol (PROVENTIL) (2.5 MG/3ML) 0.083% nebulizer solution 3 mL, 3 mL, Nebulization, Q6H PRN, [name]-Diaz, Edgardo, MD, 3 mL at 11/14/21 0737   alum & mag  hydroxide-simeth (MAALOX/MYLANTA) 200-200-20 MG/5ML suspension 15-30 mL, 15-30 mL, Oral, Q4H PRN, [name]-Diaz, Edgardo, MD, 30 mL at 11/14/21 1500   apixaban (ELIQUIS) tablet 2.5 mg, 2.5 mg, Oral, BID, Dallie Piles, RPH, 2.5 mg at 11/19/21 7412   Chlorhexidine Gluconate Cloth 2 % PADS 6 each, 6 each, Topical, Daily, Herbert Pun, MD, 6 each at 11/19/21 0903   citalopram (CELEXA) tablet 40 mg, 40 mg, Oral, BH-q7a, [name]-Diaz, Reeves Forth, MD, 40 mg at 11/19/21 0607   diltiazem (CARDIZEM CD) 24 hr capsule 240 mg, 240 mg, Oral, Daily, Tang, Lily Michelle, PA-C, 240 mg at 11/19/21 8786   famotidine (PEPCID) tablet 20 mg, 20 mg, Oral, Daily, [name]-Diaz, Edgardo, MD, 20 mg at 11/19/21 0903   feeding supplement (BOOST / RESOURCE BREEZE) liquid 1 Container, 1 Container, Oral, TID BM, [name]-Diaz, Edgardo, MD, 1 Container at 11/19/21 0903   fluticasone furoate-vilanterol (BREO ELLIPTA) 100-25 MCG/ACT 1 puff, 1 puff, Inhalation, Daily, 1 puff at 11/19/21 0903 **AND** umeclidinium bromide (INCRUSE ELLIPTA) 62.5 MCG/ACT 1 puff, 1 puff, Inhalation, Daily, Benita Gutter, RPH, 1 puff at 11/19/21 0904   furosemide (LASIX) injection 40 mg, 40 mg, Intravenous, BID, Samuella Cota, MD, 40 mg at 11/19/21 1727   gabapentin (NEURONTIN) capsule 300 mg, 300 mg, Oral, BID, [name]-Diaz, Edgardo, MD, 300 mg at 11/19/21 0903   HYDROmorphone (DILAUDID) injection 0.5 mg, 0.5 mg, Intravenous, Q2H PRN, Piscoya, Jose, MD, 0.5 mg at 11/09/21 1607   ipratropium-albuterol (DUONEB) 0.5-2.5 (3) MG/3ML nebulizer solution 3 mL, 3 mL, Nebulization, BID, [name]-Diaz, Edgardo, MD, 3 mL at 11/19/21 0720   lip balm (BLISTEX) ointment, , Topical, PRN, Herbert Pun, MD, Given at 11/06/21 0905   menthol-cetylpyridinium (CEPACOL) lozenge 3 mg, 1 lozenge, Oral, PRN, Herbert Pun, MD, 3 mg at 11/07/21 0522   metoprolol tartrate (LOPRESSOR) injection 5 mg, 5 mg, Intravenous, Q6H PRN, Sharion Settler, NP, 5 mg at  10/31/21 1553   multivitamin with minerals tablet 1 tablet, 1 tablet, Oral, Daily, [name]-Diaz, Reeves Forth, MD, 1 tablet at 11/19/21 0903   ondansetron (ZOFRAN-ODT) disintegrating tablet 4 mg, 4 mg, Oral, Q6H PRN, 4 mg at 11/07/21 1255 **OR** ondansetron (ZOFRAN) injection 4 mg, 4 mg, Intravenous, Q6H PRN, Herbert Pun, MD, 4 mg at 10/29/21 0051   oxyCODONE-acetaminophen (PERCOCET/ROXICET) 5-325 MG per tablet 1-2 tablet, 1-2 tablet, Oral, Q4H PRN, Herbert Pun, MD, 2 tablet at 11/19/21 1456   sodium chloride (OCEAN) 0.65 % nasal spray 1 spray, 1 spray, Each Nare, PRN, Sharion Settler, NP, 1 spray at 11/12/21  1206   sodium chloride flush (NS) 0.9 % injection 10-40 mL, 10-40 mL, Intracatheter, Q12H, [name]-Diaz, Edgardo, MD, 10 mL at 11/19/21 0904   sodium chloride flush (NS) 0.9 % injection 10-40 mL, 10-40 mL, Intracatheter, PRN, Herbert Pun, MD   Vitamin D (Ergocalciferol) (DRISDOL) capsule 50,000 Units, 50,000 Units, Oral, Q7 days, Murlean Iba, MD, 50,000 Units at 11/13/21 1308    ALLERGIES   Patient has no known allergies.     REVIEW OF SYSTEMS    Review of Systems:  Gen:  Denies  fever, sweats, chills weigh loss  HEENT: Denies blurred vision, double vision, ear pain, eye pain, hearing loss, nose bleeds, sore throat Cardiac:  No dizziness, chest pain or heaviness, chest tightness,edema Resp:   Denies cough or sputum porduction, shortness of breath,wheezing, hemoptysis,  Gi: Denies swallowing difficulty, stomach pain, nausea or vomiting, diarrhea, constipation, bowel incontinence Gu:  Denies bladder incontinence, burning urine Ext:   Denies Joint pain, stiffness or swelling Skin: Denies  skin rash, easy bruising or bleeding or hives Endoc:  Denies polyuria, polydipsia , polyphagia or weight change Psych:   Denies depression, insomnia or hallucinations   Other:  All other systems negative   VS: BP 96/70 (BP Location: Left Leg)    Pulse 90    Temp  98.5 F (36.9 C)    Resp 18    Ht $R'5\' 6"'Ie$  (1.676 m)    Wt 62.4 kg    SpO2 92%    BMI 22.19 kg/m      PHYSICAL EXAM    GENERAL:NAD, no fevers, chills, no weakness no fatigue HEAD: Normocephalic, atraumatic.  EYES: Pupils equal, round, reactive to light. Extraocular muscles intact. No scleral icterus.  MOUTH: Moist mucosal membrane. Dentition intact. No abscess noted.  EAR, NOSE, THROAT: Clear without exudates. No external lesions.  NECK: Supple. No thyromegaly. No nodules. No JVD.  PULMONARY: mild rhochi worse on right CARDIOVASCULAR: S1 and S2. Regular rate and rhythm. No murmurs, rubs, or gallops. No edema. Pedal pulses 2+ bilaterally.  GASTROINTESTINAL: Soft, nontender, nondistended. No masses. Positive bowel sounds. No hepatosplenomegaly.  MUSCULOSKELETAL: No swelling, clubbing, or edema. Range of motion full in all extremities.  NEUROLOGIC: Cranial nerves II through XII are intact. No gross focal neurological deficits. Sensation intact. Reflexes intact.  SKIN: No ulceration, lesions, rashes, or cyanosis. Skin warm and dry. Turgor intact.  PSYCHIATRIC: Mood, affect within normal limits. The patient is awake, alert and oriented x 3. Insight, judgment intact.       IMAGING    CT ABDOMEN PELVIS WO CONTRAST  Result Date: 11/05/2021 CLINICAL DATA:  Sepsis, fever, postoperative, rule out intra-abdominal abscess EXAM: CT ABDOMEN AND PELVIS WITHOUT CONTRAST TECHNIQUE: Multidetector CT imaging of the abdomen and pelvis was performed following the standard protocol without IV contrast. Oral enteric contrast was administered. RADIATION DOSE REDUCTION: This exam was performed according to the departmental dose-optimization program which includes automated exposure control, adjustment of the mA and/or kV according to patient size and/or use of iterative reconstruction technique. COMPARISON:  10/27/2021 FINDINGS: Lower chest: Small right pleural effusion associated atelectasis or consolidation.  Three-vessel coronary artery calcifications. Hepatobiliary: No solid liver abnormality is seen. No gallstones, gallbladder wall thickening, or biliary dilatation. Pancreas: Unremarkable. No pancreatic ductal dilatation or surrounding inflammatory changes. Spleen: Normal in size without significant abnormality. Adrenals/Urinary Tract: Adrenal glands are unremarkable. Kidneys are normal, without renal calculi, solid lesion, or hydronephrosis. Bladder is unremarkable. Stomach/Bowel: Stomach is within normal limits. The mid small bowel is diffusely gas  and contrast filled, and mildly distended, largest loops measuring 4.1 cm. Status post right hemicolectomy. Descending and sigmoid diverticula. Vascular/Lymphatic: Aortic atherosclerosis. No enlarged abdominal or pelvic lymph nodes. Reproductive: No mass or other significant abnormality. Other: Anasarca. Persistent, although improved, scattered subcutaneous emphysema about the abdominal wall (series 2, image 38). Unchanged subcutaneous fluid collections of the left and right ventral abdomen, collection on the left measuring 3.4 x 2.2 cm (series 2, image 36), collection on the right measuring 3.5 x 2.0 cm (series 2, image 43). No ascites. Musculoskeletal: No acute or significant osseous findings. IMPRESSION: 1. Unchanged subcutaneous fluid collections of the left and right ventral abdomen, collection on the left measuring 3.4 x 2.2 cm, collection on the right measuring 3.5 x 2.0 cm. These most likely reflect postoperative hematoma or seroma, although presence or absence of infection within this fluid is not established by CT. 2. Status post right hemicolectomy. 3. The mid small bowel is diffusely gas and contrast filled, and mildly distended, largest loops measuring 4.1 cm. Findings are most consistent with postoperative ileus. 4. Right pleural effusion and anasarca. 5. Coronary artery disease. Aortic Atherosclerosis (ICD10-I70.0). Electronically Signed   By: Delanna Ahmadi  M.D.   On: 11/05/2021 17:34   CT ABDOMEN PELVIS WO CONTRAST  Result Date: 10/27/2021 CLINICAL DATA:  Postop day 4 from ileostomy reversal. Vomiting. Evaluate for anastomotic leak. EXAM: CT ABDOMEN AND PELVIS WITHOUT CONTRAST TECHNIQUE: Multidetector CT imaging of the abdomen and pelvis was performed following the standard protocol without IV contrast. RADIATION DOSE REDUCTION: This exam was performed according to the departmental dose-optimization program which includes automated exposure control, adjustment of the mA and/or kV according to patient size and/or use of iterative reconstruction technique. COMPARISON:  Abdominopelvic CT 11/05/2020.  Radiographs 10/26/2021. FINDINGS: Study was performed with the patient in the right lateral decubitus position. Rectal contrast was administered. Lower chest: Apparent right middle lobe collapse with a small amount of adjacent pleural fluid, incompletely visualized. Mild dependent atelectasis in both lower lobes. There is atherosclerosis of the aorta and coronary arteries. Hepatobiliary: Stable low-density liver lesions, likely cysts based on stability. Stable mild biliary dilatation status post cholecystectomy. Pancreas: Unremarkable. No pancreatic ductal dilatation or surrounding inflammatory changes. Spleen: Normal in size without focal abnormality. Adrenals/Urinary Tract: Both adrenal glands appear normal. Both kidneys demonstrate mild perinephric soft tissue stranding, similar to previous study. No evidence of urinary tract calculus or hydronephrosis. The bladder appears unremarkable for its degree of distention. Stomach/Bowel: No oral contrast was administered. Rectal contrast has passed retrograde through the colon and through the ileocolonic anastomosis at the level of the mid transverse colon status post right hemicolectomy. There is retrograde filling of the distal small bowel. No anastomotic leak. The stomach and small bowel are fluid-filled and mildly  distended. No evidence of bowel wall thickening or pneumatosis. Vascular/Lymphatic: There are no enlarged abdominal or pelvic lymph nodes. Diffuse aortic and branch vessel atherosclerosis. Reproductive: Stable appearance of the uterus and adnexa. No adnexal mass. Tubal ligation clips noted. Other: As seen on the postoperative radiographs, there is a large amount of soft tissue emphysema throughout the anterior abdominal wall which extends from the perineum into the presternal soft tissues. There are small focal fluid collections within the right and left anterior abdominal wall, likely related to recent laparoscopic procedure. No free intraperitoneal air, ascites or focal extraluminal fluid collections identified within the peritoneal cavity. Musculoskeletal: No acute or significant osseous findings. Stable chronic mild superior endplate compression fractures at L3 and L5. IMPRESSION:  1. Status post recent ileostomy reversal and ileocolonic anastomosis. The anastomosis is patent, without leak. 2. Nonspecific gastric and proximal small bowel distension without focal transition point, likely related to a postoperative ileus. Recommend radiographic follow-up. Patient may benefit from a nasogastric tube. 3. As seen on postoperative radiographs, there is extensive soft tissue emphysema throughout the abdominal wall which is likely related to the laparoscopic procedure. Soft tissue infection should be excluded clinically. Small fluid collections in the anterior abdominal wall are attributed to the procedure, and no unexpected collections are identified. There is no pneumoperitoneum or abnormal intra-abdominal fluid collection. 4. Atelectasis at both lung bases with apparent partial right middle lobe collapse. 5. Extensive Aortic Atherosclerosis (ICD10-I70.0). Electronically Signed   By: Richardean Sale M.D.   On: 10/27/2021 16:28   DG Chest 1 View  Result Date: 10/26/2021 CLINICAL DATA:  Hypotension EXAM: CHEST  1  VIEW COMPARISON:  11/16/2020, 06/05/2020, PET CT 12/01/2019, CT chest 11/06/2019, 11/06/2019 FINDINGS: Mild cardiomegaly with central vascular congestion. Small pleural effusions and diffuse increased interstitial opacity likely due to edema. Aortic atherosclerosis. No pneumothorax. Irregular opacity with mild distortion and nodularity in the right upper lobe and right hilus similar compared to intermittent previous exams and presumably due to scarring. IMPRESSION: 1. Cardiomegaly with vascular congestion, small pleural effusions and mild diffuse interstitial edema 2. Irregular and nodular opacities in the right hilus and upper lobe present on previous exams and probably due to scarring Electronically Signed   By: Donavan Foil M.D.   On: 10/26/2021 17:54   DG Chest 2 View  Result Date: 11/18/2021 CLINICAL DATA:  Admitted for acute kidney injury, continued fatigue. Recent pleural effusion on RIGHT. EXAM: CHEST - 2 VIEW COMPARISON:  Chest x-rays dated 11/16/2021, 11/14/2021, 01/20/2020 and 07/31/2020. FINDINGS: Heart size and mediastinal contours are stable. Chronic scarring is again within the RIGHT perihilar lung. Coarse lung markings are seen throughout both lungs. No confluent opacities to suggest superimposed pneumonia. Small bilateral pleural effusions, RIGHT greater than LEFT. No pneumothorax is seen. RIGHT-sided PICC line appears well positioned with tip at the level of the lower SVC/cavoatrial junction. No acute or suspicious osseous abnormality. Extensive aortic atherosclerosis. Mildly distended gas-filled loops of small bowel within the upper abdomen, incompletely imaged. IMPRESSION: 1. Small bilateral pleural effusions, RIGHT greater than LEFT. 2. Coarse lung markings throughout both lungs, compatible with chronic interstitial lung disease/fibrosis. No evidence of superimposed pneumonia or pulmonary edema. 3. Aortic atherosclerosis. 4. Suspect continued postoperative ileus with mildly distended  gas-filled loops of small bowel partially imaged within the upper abdomen. Electronically Signed   By: Franki Cabot M.D.   On: 11/18/2021 12:32   DG Chest 2 View  Result Date: 11/09/2021 CLINICAL DATA:  Shortness of breath.  Mild chest pain.  Smoker. EXAM: CHEST - 2 VIEW COMPARISON:  11/05/2021 FINDINGS: Right PICC tip in the region of the inferior aspect of the superior vena cava without significant change. The nasogastric tube has been removed. Normal sized heart. Stable linear scarring in the right upper lobe. No significant change in a small right pleural effusion and right basilar airspace opacity. Clear left lung. Mildly tortuous and calcified thoracic aorta. No acute bony abnormality. IMPRESSION: Stable small right pleural effusion and right basilar atelectasis and/or pneumonia. Electronically Signed   By: Claudie Revering M.D.   On: 11/09/2021 10:17   DG Abd 1 View  Result Date: 11/03/2021 CLINICAL DATA:  NG tube placement EXAM: ABDOMEN - 1 VIEW COMPARISON:  11/03/2021 11:05 a.m. FINDINGS: Advancement  of NG tube with tip and side port below the GE junction. Redemonstrated small bowel dilatation, not significantly changed from the prior exam. The stomach is not distended. The lower abdomen is not included for evaluation. IMPRESSION: Interval advancement of nasogastric tube, with tip and side port overlying the stomach. Overall unchanged small bowel dilatation in the imaged portion of the abdomen. Electronically Signed   By: Merilyn Baba M.D.   On: 11/03/2021 16:01   DG Abd 1 View  Result Date: 10/28/2021 CLINICAL DATA:  NG tube placement EXAM: ABDOMEN - 1 VIEW COMPARISON:  10/26/2021 FINDINGS: Limited radiograph of the lower chest and upper abdomen was obtained for the purposes of enteric tube localization. Enteric tube is seen coursing below the diaphragm with distal tipterminating within the proximal stomach. Side port terminates within the distal esophagus. Similar degree of gaseous distension of  large and small bowel within the visualized abdomen. IMPRESSION: Enteric tube with distal tip terminating within the proximal stomach, side port terminating within the distal esophagus. Recommend advancement 7-10 cm. Electronically Signed   By: Davina Poke D.O.   On: 10/28/2021 15:48   DG Abd 1 View  Result Date: 10/26/2021 CLINICAL DATA:  Hypotension history of ileostomy take down EXAM: ABDOMEN - 1 VIEW COMPARISON:  11/05/2020 FINDINGS: Clips in the pelvis. Moderate air distension of the stomach. Air-filled dilated small bowel up to 3.5 cm in the central abdomen. Extensive mottled appearance of the abdominal soft tissues presumably due to soft tissue emphysema related to recent laparoscopic procedure. IMPRESSION: 1. Air distension of stomach and small bowel either due to ileus or possible obstruction. 2. Diffuse mottled appearance of soft tissues felt consistent with soft tissue emphysema likely due to recent laparoscopic procedure Electronically Signed   By: Donavan Foil M.D.   On: 10/26/2021 17:51   US RENAL  Result Date: 10/25/2021 CLINICAL DATA:  Acute kidney injury EXAM: RENAL / URINARY TRACT ULTRASOUND COMPLETE COMPARISON:  CT 11/05/2020 FINDINGS: Right Kidney: Renal measurements: 8.9 x 4.1 x 4.2 cm = volume: 79.8 mL. Cortex slightly echogenic. No mass or hydronephrosis Left Kidney: Renal measurements: 7.5 x 4.4 x 4 cm = volume: 88.5 mL. Cortex slightly echogenic. No mass or hydronephrosis. Bladder: Appears normal for degree of bladder distention. Other: None. IMPRESSION: Slight increased cortical echogenicity suggesting medical renal disease. No hydronephrosis. Electronically Signed   By: Donavan Foil M.D.   On: 10/25/2021 16:28   DG Chest Port 1 View  Result Date: 11/16/2021 CLINICAL DATA:  Shortness of breath EXAM: PORTABLE CHEST 1 VIEW COMPARISON:  Previous studies including the examination of 11/14/2021 FINDINGS: Transverse diameter of heart is increased. Central pulmonary vessels are  prominent. Linear densities in the right parahilar region have not changed significantly. Small bilateral pleural effusions are seen, more so on the right side. Crowding of markings in the medial lower lung fields suggest atelectasis/pneumonia. There is no pneumothorax. Tip of PICC line is seen in superior vena cava close to right atrium. IMPRESSION: Cardiomegaly. Central pulmonary vessels are prominent. Prominence of interstitial markings in the right parahilar region and both lower lung fields may suggest interstitial edema or pneumonia. Small bilateral pleural effusions, more so on the right side. Electronically Signed   By: Elmer Picker M.D.   On: 11/16/2021 08:13   DG Chest Port 1 View  Result Date: 11/14/2021 CLINICAL DATA:  Atelectasis, history of small-bowel obstruction EXAM: PORTABLE CHEST 1 VIEW COMPARISON:  11/13/2021 FINDINGS: No significant change in AP portable chest radiograph, with cardiomegaly and right  upper extremity PICC. Diffuse bilateral interstitial pulmonary opacity, small bilateral pleural effusions, and bandlike scarring or atelectasis of the right midlung, unchanged. IMPRESSION: No significant change in AP portable chest radiograph, with cardiomegaly, probable edema, and bilateral pleural effusions. No new airspace opacity. Electronically Signed   By: Delanna Ahmadi M.D.   On: 11/14/2021 09:58   DG Chest Port 1 View  Result Date: 11/13/2021 CLINICAL DATA:  Post right thoracentesis EXAM: PORTABLE CHEST 1 VIEW COMPARISON:  Chest x-ray dated November 12, 2021 FINDINGS: Cardiac and mediastinal contours are unchanged. Linear nodular opacity of the right upper lobe, unchanged from prior exam. No new parenchymal opacity. Interval decreased size of small right pleural effusion. No evidence pneumothorax. IMPRESSION: No evidence of pneumothorax. Electronically Signed   By: Yetta Glassman M.D.   On: 11/13/2021 11:46   DG Chest Port 1 View  Result Date: 11/12/2021 CLINICAL DATA:   Acute respiratory failure with hypoxia. EXAM: PORTABLE CHEST 1 VIEW COMPARISON:  November 09, 2021 FINDINGS: Right upper extremity PICC with tip near the superior cavoatrial junction. The heart size and mediastinal contours are within normal limits. Stable linear scarring in the right upper lobe. Slightly increased small right pleural effusion with adjacent airspace opacities. Increased conspicuity of the irregular nodular areas of consolidation in the right mid lung. The visualized skeletal structures are unremarkable. IMPRESSION: 1. Slightly increased small right pleural effusion with adjacent airspace opacities. 2. Increased conspicuity of the irregular nodular areas of consolidation in the right mid lung. Electronically Signed   By: Dahlia Bailiff M.D.   On: 11/12/2021 08:55   DG Chest Port 1 View  Result Date: 11/05/2021 CLINICAL DATA:  Fever. EXAM: PORTABLE CHEST 1 VIEW COMPARISON:  Chest radiograph 10/26/2021. FINDINGS: Right upper extremity PICC line tip projects over the superior vena cava. Enteric tube is present. The side port is at the level of the distal esophagus. Stable enlarged cardiac and mediastinal contours. Similar-appearing distortion and consolidation within the mid right lung. Bibasilar atelectasis, right-greater-than-left. Probable small right pleural effusion. No pneumothorax. IMPRESSION: Enteric tube side-port at the distal esophagus, recommend advancement. Right upper extremity PICC line tip projects over the superior vena cava. Similar-appearing irregular and nodular areas of consolidation within the right mid lung. These results will be called to the ordering clinician or representative by the Radiologist Assistant, and communication documented in the PACS or [employer]. Electronically Signed   By: Lovey Newcomer M.D.   On: 11/05/2021 08:04   DG Abd 2 Views  Result Date: 11/06/2021 CLINICAL DATA:  Abdominal pain EXAM: ABDOMEN - 2 VIEW COMPARISON:  Abdominal x-ray 11/03/2021  FINDINGS: Enteric tube identified with the tip in the lower esophagus. Mildly distended loops of small bowel are again seen throughout the abdomen measuring up to 3.3 cm in diameter. No free air identified on the decubitus view. No suspicious calcifications identified. IMPRESSION: 1. Enteric tube tip is in the lower esophagus, advancement recommended. 2. Mildly distended loops of bowel throughout the abdomen which could be seen with ileus or obstruction. Continued follow-up recommended. Electronically Signed   By: Ofilia Neas M.D.   On: 11/06/2021 10:42   DG Abd 2 Views  Result Date: 11/03/2021 CLINICAL DATA:  Abdominal distention EXAM: ABDOMEN - 2 VIEW COMPARISON:  Previous studies including the examination of 11/02/2021 FINDINGS: There is interval worsening of small bowel dilation measuring up to 4.9 cm in diameter. Tip enteric tube is noted at the gastroesophageal junction pointing cephalad. There is no distention of stomach. There is interval  passage of contrast from the colon. There is evidence of previous tubal ligation. IMPRESSION: There is interval increase in small bowel dilation suggesting high-grade small bowel obstruction. Tip of enteric tube is at the gastroesophageal junction pointing cephalad. NG tube should be advanced 5-10 cm to place the tip and side port within the stomach. Electronically Signed   By: Elmer Picker M.D.   On: 11/03/2021 12:36   DG Abd 2 Views  Result Date: 10/31/2021 CLINICAL DATA:  Ileus following gastrointestinal surgery, colostomy takedown 10/23/2021 EXAM: ABDOMEN - 2 VIEW COMPARISON:  10/28/2021 FINDINGS: Nasogastric tube in proximal stomach. Persistent dilatation of small bowel loops in the abdomen without wall thickening. Small amount retained contrast in colon. Levoconvex lumbar scoliosis. Foci of soft tissue gas at RIGHT lateral abdominal wall consistent with preceding surgery. Atelectasis at RIGHT lung base. IMPRESSION: Persistent small bowel ileus.  Electronically Signed   By: Lavonia Dana M.D.   On: 10/31/2021 10:59   DG Abd Portable 1V-Small Bowel Obstruction Protocol-initial, 8 hr delay  Result Date: 11/02/2021 CLINICAL DATA:  8 hour follow-up small-bowel delay EXAM: PORTABLE ABDOMEN - 1 VIEW COMPARISON:  Film from the previous day. FINDINGS: Gastric catheter is noted within the stomach. Contrast material administered now lies within the dilated small bowel as well as within the colon. These changes are consistent with a partial small bowel obstruction. No free air is noted. IMPRESSION: Passage of contrast into the colon consistent with a partial small bowel obstruction. Electronically Signed   By: Inez Catalina M.D.   On: 11/02/2021 03:13   DG Abd Portable 1V  Result Date: 11/01/2021 CLINICAL DATA:  Enteric catheter placement EXAM: PORTABLE ABDOMEN - 1 VIEW COMPARISON:  10/31/2021 FINDINGS: Frontal view of the lower chest and upper abdomen demonstrates enteric catheter tip and side port projecting over the gastric fundus. Right-sided PICC tip overlies atriocaval junction. Cardiac silhouette is enlarged. Small right pleural effusion. Stable vascular congestion. Stable distended gas-filled loops of small bowel, likely ileus given recent surgery. IMPRESSION: 1. Enteric catheter projecting over gastric fundus. 2. Stable small bowel dilatation consistent with ileus. Electronically Signed   By: Randa Ngo M.D.   On: 11/01/2021 16:02   ECHOCARDIOGRAM COMPLETE  Result Date: 10/27/2021    ECHOCARDIOGRAM REPORT   Patient Name:   Danielle [name] [facility] Date of Exam: 10/27/2021 Medical Rec #:  [id number]       Height:       66.0 in Accession #:    [id number]      Weight:       124.0 lb Date of Birth:  [birth year]       BSA:          42.632 m Patient Age:    87 years        BP:           144/84 mmHg Patient Gender: F               HR:           95 bpm. Exam Location:  [facility] Procedure: 2D Echo, Cardiac Doppler and Color Doppler Indications:     Elevated troponin   History:         Patient has prior history of Echocardiogram examinations, most                  recent 11/07/2019. COPD, Arrythmias:Atrial Fibrillation,                  Signs/Symptoms:Murmur; Risk Factors:Hypertension.  Sonographer:     Cristela Blue Referring Phys:  [id number] Verdia Kuba Diagnosing Phys: Sena Slate IMPRESSIONS  1. Left ventricular ejection fraction, by estimation, is 60 to 65%. The left ventricle has normal function. The left ventricle has no regional wall motion abnormalities. Left ventricular diastolic parameters are consistent with Grade I diastolic dysfunction (impaired relaxation).  2. Right ventricular systolic function is normal. The right ventricular size is mildly enlarged.  3. Right atrial size was mildly dilated.  4. The mitral valve is normal in structure. Moderate mitral valve regurgitation. No evidence of mitral stenosis.  5. Tricuspid valve regurgitation is moderate.  6. The aortic valve is calcified. Aortic valve regurgitation is not visualized. Mild aortic valve stenosis.  7. The inferior vena cava is normal in size with greater than 50% respiratory variability, suggesting right atrial pressure of 3 mmHg. Conclusion(s)/Recommendation(s): CALCIFIED AORTIC VALVE WITH MILD AS. VMax 2.1 m/s. Dimensionless index 0.4. FINDINGS  Left Ventricle: Left ventricular ejection fraction, by estimation, is 60 to 65%. The left ventricle has normal function. The left ventricle has no regional wall motion abnormalities. The left ventricular internal cavity size was normal in size. There is  no left ventricular hypertrophy. Left ventricular diastolic parameters are consistent with Grade I diastolic dysfunction (impaired relaxation). Right Ventricle: The right ventricular size is mildly enlarged. No increase in right ventricular wall thickness. Right ventricular systolic function is normal. Left Atrium: Left atrial size was normal in size. Right Atrium: Right atrial size was mildly dilated. Pericardium:  There is no evidence of pericardial effusion. Mitral Valve: The mitral valve is normal in structure. Moderate mitral valve regurgitation. No evidence of mitral valve stenosis. MV peak gradient, 3.1 mmHg. The mean mitral valve gradient is 1.0 mmHg. Tricuspid Valve: The tricuspid valve is normal in structure. Tricuspid valve regurgitation is moderate . No evidence of tricuspid stenosis. Aortic Valve: The aortic valve is calcified. Aortic valve regurgitation is not visualized. Mild aortic stenosis is present. Aortic valve mean gradient measures 9.4 mmHg. Aortic valve peak gradient measures 16.8 mmHg. Aortic valve area, by VTI measures 1.21 cm. Pulmonic Valve: The pulmonic valve was not well visualized. Pulmonic valve regurgitation is not visualized. No evidence of pulmonic stenosis. Aorta: The aortic root is normal in size and structure. Venous: The inferior vena cava is normal in size with greater than 50% respiratory variability, suggesting right atrial pressure of 3 mmHg. IAS/Shunts: No atrial level shunt detected by color flow Doppler.  LEFT VENTRICLE PLAX 2D LVIDd:         4.80 cm   Diastology LVIDs:         3.00 cm   LV e' medial:    5.87 cm/s LV PW:         1.10 cm   LV E/e' medial:  13.9 LV IVS:        0.75 cm   LV e' lateral:   12.40 cm/s LVOT diam:     2.00 cm   LV E/e' lateral: 6.6 LV SV:         45 LV SV Index:   28 LVOT Area:     3.14 cm  RIGHT VENTRICLE RV Basal diam:  3.80 cm RV S prime:     12.20 cm/s TAPSE (M-mode): 1.8 cm LEFT ATRIUM             Index        RIGHT ATRIUM           Index LA diam:  3.20 cm 1.96 cm/m   RA Area:     19.30 cm LA Vol (A2C):   58.8 ml 36.03 ml/m  RA Volume:   63.90 ml  39.15 ml/m LA Vol (A4C):   38.7 ml 23.71 ml/m LA Biplane Vol: 49.1 ml 30.08 ml/m  AORTIC VALVE                     PULMONIC VALVE AV Area (Vmax):    1.04 cm      PV Vmax:        0.59 m/s AV Area (Vmean):   0.91 cm      PV Vmean:       35.200 cm/s AV Area (VTI):     1.21 cm      PV VTI:          0.109 m AV Vmax:           205.20 cm/s   PV Peak grad:   1.4 mmHg AV Vmean:          140.200 cm/s  PV Mean grad:   1.0 mmHg AV VTI:            0.373 m       RVOT Peak grad: 4 mmHg AV Peak Grad:      16.8 mmHg AV Mean Grad:      9.4 mmHg LVOT Vmax:         67.90 cm/s LVOT Vmean:        40.500 cm/s LVOT VTI:          0.144 m LVOT/AV VTI ratio: 0.39  AORTA Ao Root diam: 3.17 cm MITRAL VALVE               TRICUSPID VALVE MV Area (PHT): 4.86 cm    TR Peak grad:   52.4 mmHg MV Area VTI:   3.01 cm    TR Vmax:        362.00 cm/s MV Peak grad:  3.1 mmHg MV Mean grad:  1.0 mmHg    SHUNTS MV Vmax:       0.88 m/s    Systemic VTI:  0.14 m MV Vmean:      55.0 cm/s   Systemic Diam: 2.00 cm MV Decel Time: 156 msec    Pulmonic VTI:  0.157 m MV E velocity: 81.40 cm/s MV A velocity: 51.40 cm/s MV E/A ratio:  1.58 Donnelly Angelica Electronically signed by Donnelly Angelica Signature Date/Time: 10/27/2021/4:07:10 PM    Final    [place] EKG SITE RITE  Result Date: 11/01/2021 If Site Rite image not attached, placement could not be confirmed due to current cardiac rhythm.  US THORACENTESIS ASP PLEURAL SPACE W/IMG GUIDE  Result Date: 11/13/2021 INDICATION: Right pleural effusion with shortness breath request received for diagnostic and therapeutic thoracentesis. EXAM: ULTRASOUND GUIDED RIGHT THORACENTESIS MEDICATIONS: Local 1% lidocaine only. COMPLICATIONS: None immediate. PROCEDURE: An ultrasound guided thoracentesis was thoroughly discussed with the patient and questions answered. The benefits, risks, alternatives and complications were also discussed. The patient understands and wishes to proceed with the procedure. Written consent was obtained. Ultrasound was performed to localize and mark an adequate pocket of fluid in the right chest. The area was then prepped and draped in the normal sterile fashion. 1% Lidocaine was used for local anesthesia. Under ultrasound guidance a 19 gauge, 7-cm, Yueh catheter was introduced. Thoracentesis was  performed. The catheter was removed and a dressing applied. FINDINGS: A total of approximately 1 L of amber  colored fluid was removed. Samples were sent to the laboratory as requested by the clinical team. IMPRESSION: Successful ultrasound guided right thoracentesis yielding 1 L of pleural fluid. This exam was performed by Tsosie Billing PA-C, and was supervised and interpreted by Dr. Denna Haggard. Electronically Signed   By: Albin Felling M.D.   On: 11/13/2021 12:05       ASSESSMENT/PLAN   Acute on chronic hypoxemic respiratory failure   - Due to moderate right pleural effusion with associated right lower lobe complete atelectasis   -s/p thoracentesis - 1 L - aspirated -                 -patient would benefit from diuresis - nephrology on case appreciate collaboration  Moderate pleural effusion   S/p aspiration - fluid pattern studies in process     Suspect transudative fluid due to CKD - Noted nephrology on case - appreciate input   Right lower lobe atelectasis   - patient would benefit from recruitment maneuvers    - will order RT Metaneb with albuterol BID    - Incentive spirometry and flutter valve PRN  CKD   - likely culprit with fluid overload contributing to pulmonary and gut edema   Atrial fibrillation - chronic - potential for rapid ventricular response due to albuterol/ipratropium therapy     Thank you for allowing me to participate in the care of this patient.    Patient/Family are satisfied with care plan and all questions have been answered.  This document was prepared using Dragon voice recognition software and may include unintentional dictation errors.     Ottie Glazier, M.D.  Division of Pulmonary & [place]         .fa

## 2021-11-19 NOTE — Progress Notes (Signed)
Patient ID: Danielle [name], female   DOB: Sep 05, 1956, 66 y.o.   MRN: [id number]     [place] [facility] Day(s): 27.   Interval History: Patient seen and examined, no acute events or new complaints overnight. Patient reports still feeling fatigue after getting up from the bed and sitting in the commode.  Discussed her significant shortness of breath.  Denies chest pain.  Vital signs in last 24 hours: [min-max] current  Temp:  [97.7 F (36.5 C)-98.6 F (37 C)] 98.2 F (36.8 C) (02/05 0852) Pulse Rate:  [70-101] 86 (02/05 0852) Resp:  [16-18] 16 (02/05 0852) BP: (97-107)/(73-83) 101/73 (02/05 0852) SpO2:  [92 %-100 %] 94 % (02/05 0852)     Height: 5\' 6"  (167.6 cm) Weight: 62.4 kg BMI (Calculated): 22.2   Physical Exam:  Constitutional: alert, cooperative and no distress  Respiratory: breathing non-labored at rest with nasal cannula at 2 L/min Cardiovascular: Irregular rate and rhythm Gastrointestinal: soft, non-tender, and non-distended  Labs:  CBC Latest Ref Rng & Units 11/18/2021 11/17/2021 11/16/2021  WBC 4.0 - 10.5 K/uL 8.8 7.2 6.8  Hemoglobin 12.0 - 15.0 g/dL 7.9(L) 7.9(L) 7.7(L)  Hematocrit 36.0 - 46.0 % 24.6(L) 24.3(L) 24.3(L)  Platelets 150 - 400 K/uL 337 317 361   CMP Latest Ref Rng & Units 11/19/2021 11/18/2021 11/17/2021  Glucose 70 - 99 mg/dL 85 96 79  BUN 8 - 23 mg/dL 23 23 22   Creatinine 0.44 - 1.00 mg/dL 2.15(H) 1.98(H) 1.87(H)  Sodium 135 - 145 mmol/L 134(L) 138 138  Potassium 3.5 - 5.1 mmol/L 4.6 5.4(H) 4.6  Chloride 98 - 111 mmol/L 97(L) 100 101  CO2 22 - 32 mmol/L 29 32 29  Calcium 8.9 - 10.3 mg/dL 8.9 8.8(L) 8.6(L)  Total Protein 6.5 - 8.1 g/dL - - -  Total Bilirubin 0.3 - 1.2 mg/dL - - -  Alkaline Phos 38 - 126 U/L - - -  AST 15 - 41 U/L - - -  ALT 0 - 44 U/L - - -    Imaging studies: No new pertinent imaging studies   Assessment/Plan:  66 y.o. female with history of an ileostomy and mucous fistula 27 Day Post-Op s/p ileostomy reversal, complicated  by pertinent comorbidities including COPD, atrial fibrillation on anticoagulation, hypertension, chronic kidney disease stage III.  Status post ileostomy reversal -Patient initially with prolonged postoperative ileus that resolved. -Tolerating soft diet well -Passing gas and having bowel movements    Chronic kidney disease stage III -Acute kidney injury after surgery.   --Nephrology managing with diuretics.   Shortness of breath -Most likely due to pleural effusions with decreased lung reserve -Pleural effusion most likely due to chronic kidney disease/CHF -We will continue with diuretic as per recommendation by nephrology and pulmonology -We will continue working with PT for improvement of deconditioning Continue with nasal cannula between 2 L/min to 3 L/min as tolerated -Appreciate hospitalist, nephrology and pulmonology further management recommendations   A. Fib -Intermittent rate control -Currently on therapeutic anticoagulation -Appreciate hospitalist and cardiologist evaluation and management   Encourage patient to get out of bed.  Will continue pain management.   Arnold Long, MD

## 2021-11-19 NOTE — Plan of Care (Signed)
  Problem: Health Behavior/Discharge Planning: Goal: Ability to manage health-related needs will improve Outcome: Progressing   Problem: Clinical Measurements: Goal: Ability to maintain clinical measurements within normal limits will improve Outcome: Progressing   

## 2021-11-19 NOTE — TOC Progression Note (Signed)
Transition of Care [employer] [facility]) - Progression Note    Patient Details  Name: Danielle [name] MRN: [id number] Date of Birth: [birth year]  [facility] CM/SW Contact  Izola Price, RN Phone Number: 11/19/2021, 10:14 AM  Clinical Narrative:  2/5: Spoke with patient regarding HH PT recommendations and patient has a niece that does therapy and is going to go with that. Explained how to contact PCP if she changes her mind or needs additional therapy. Patient has a RW at home but requested a BSC/3:1. Contacted [employer] and requested DME order from provider. Patient has transportation via family members. See PCP at [facility]. RX at [facility] and [facility]. Simmie Davies RN CM     Expected Discharge Plan: Home/Self Care Barriers to Discharge: Continued Medical Work up  Expected Discharge Plan and Services Expected Discharge Plan: Home/Self Care                                               Social Determinants of Health (SDOH) Interventions    Readmission Risk Interventions Readmission Risk Prevention Plan 10/16/2019 09/28/2019 06/20/2019  Transportation Screening Complete Complete Complete  Medication Review [employer]) Referral to Pharmacy Complete Complete  [place] or Home Care Consult Complete Patient refused Complete  SW Recovery Care/Counseling Consult Complete Complete -  Palliative Care Screening Not Applicable Not Applicable Not [place] Not Applicable Not Applicable -  Some recent data might be hidden

## 2021-11-19 NOTE — Progress Notes (Signed)
[facility]  PROGRESS NOTE   Subjective:   Patient seen at bedside. She complains of being very weak.  Objective:  Vital signs in last 24 hours:  Temp:  [97.7 F (36.5 C)-98.6 F (37 C)] 98.2 F (36.8 C) (02/05 0852) Pulse Rate:  [70-101] 86 (02/05 0852) Resp:  [16-18] 16 (02/05 0852) BP: (97-107)/(73-83) 101/73 (02/05 0852) SpO2:  [92 %-100 %] 94 % (02/05 0852)  Weight change:  Filed Weights   11/16/21 0500 11/17/21 0449 11/18/21 0500  Weight: 61.8 kg 62.3 kg 62.4 kg    Intake/Output: I/O last 3 completed shifts: In: 200 [P.O.:200] Out: 300 [Urine:300]   Intake/Output this shift:  No intake/output data recorded.  Physical Exam: General:  No acute distress  Head:  Normocephalic, atraumatic. Moist oral mucosal membranes  Eyes:  Anicteric  Neck:  Supple  Lungs:   Clear to auscultation, normal effort  Heart:  S1S2 no rubs  Abdomen:   Soft, nontender, bowel sounds present  Extremities:  peripheral edema.  Neurologic:  Awake, alert, following commands  Skin:  No lesions  Access:     Basic Metabolic Panel: Recent Labs  Lab 11/15/21 1027 11/16/21 0506 11/17/21 0511 11/18/21 0510 11/19/21 0905  NA 137 141 138 138 134*  K 3.2* 3.8 4.6 5.4* 4.6  CL 100 103 101 100 97*  CO2 30 31 29  32 29  GLUCOSE 120* 84 79 96 85  BUN 23 22 22 23 23   CREATININE 1.78* 1.75* 1.87* 1.98* 2.15*  CALCIUM 8.5* 8.5* 8.6* 8.8* 8.9  MG  --   --  1.3* 2.0  --   PHOS  --   --  3.8  --  4.6    CBC: Recent Labs  Lab 11/13/21 0445 11/15/21 1027 11/16/21 0506 11/17/21 0511 11/18/21 0510  WBC 7.4 7.9 6.8 7.2 8.8  HGB 7.5* 8.0* 7.7* 7.9* 7.9*  HCT 23.1* 24.8* 24.3* 24.3* 24.6*  MCV 92.4 94.7 95.7 94.9 94.6  PLT 330 321 361 317 337     Urinalysis: No results for input(s): COLORURINE, LABSPEC, PHURINE, GLUCOSEU, HGBUR, BILIRUBINUR, KETONESUR, PROTEINUR, UROBILINOGEN, NITRITE, LEUKOCYTESUR in the last 72 hours.  Invalid input(s): APPERANCEUR    Imaging: DG  Chest 2 View  Result Date: 11/18/2021 CLINICAL DATA:  Admitted for acute kidney injury, continued fatigue. Recent pleural effusion on RIGHT. EXAM: CHEST - 2 VIEW COMPARISON:  Chest x-rays dated 11/16/2021, 11/14/2021, 01/20/2020 and 07/31/2020. FINDINGS: Heart size and mediastinal contours are stable. Chronic scarring is again within the RIGHT perihilar lung. Coarse lung markings are seen throughout both lungs. No confluent opacities to suggest superimposed pneumonia. Small bilateral pleural effusions, RIGHT greater than LEFT. No pneumothorax is seen. RIGHT-sided PICC line appears well positioned with tip at the level of the lower SVC/cavoatrial junction. No acute or suspicious osseous abnormality. Extensive aortic atherosclerosis. Mildly distended gas-filled loops of small bowel within the upper abdomen, incompletely imaged. IMPRESSION: 1. Small bilateral pleural effusions, RIGHT greater than LEFT. 2. Coarse lung markings throughout both lungs, compatible with chronic interstitial lung disease/fibrosis. No evidence of superimposed pneumonia or pulmonary edema. 3. Aortic atherosclerosis. 4. Suspect continued postoperative ileus with mildly distended gas-filled loops of small bowel partially imaged within the upper abdomen. Electronically Signed   By: Franki Cabot M.D.   On: 11/18/2021 12:32     Medications:     apixaban  2.5 mg Oral BID   Chlorhexidine Gluconate Cloth  6 each Topical Daily   citalopram  40 mg Oral BH-q7a  diltiazem  240 mg Oral Daily   famotidine  20 mg Oral Daily   feeding supplement  1 Container Oral TID BM   fluticasone furoate-vilanterol  1 puff Inhalation Daily   And   umeclidinium bromide  1 puff Inhalation Daily   furosemide  40 mg Intravenous BID   gabapentin  300 mg Oral BID   ipratropium-albuterol  3 mL Nebulization BID   midodrine  5 mg Oral TID WC   multivitamin with minerals  1 tablet Oral Daily   sodium chloride flush  10-40 mL Intracatheter Q12H   Vitamin D  (Ergocalciferol)  50,000 Units Oral Q7 days    Assessment/ Plan:     Principal Problem:   Ileostomy status ([place]) Active Problems:   Hyponatremia   Acute respiratory failure with hypoxia (HCC)   Weakness   Acute kidney injury superimposed on CKD (HCC)   HTN (hypertension)   AF (paroxysmal atrial fibrillation) (HCC)   Normocytic anemia   Depression   Pleural effusion due to CHF (congestive heart failure) (HCC)   Hypokalemia   Acute on chronic combined systolic and diastolic CHF (congestive heart failure) (HCC)   Hyperphosphatemia   Hypomagnesemia   Anemia of chronic disease   Ileus following gastrointestinal surgery (HCC)   Aspiration pneumonia of right lower lobe due to gastric secretions ([place])  66 year old female with history of hypertension, coronary artery disease, atrial fibrillation, COPD, chronic kidney disease stage IIIb and history of ileostomy now s/p ileostomy reversal. Postoperative course complicated by ileus development and acute kidney injury on the top of chronic kidney disease.  She also developed anemia requiring 2 units of blood transfusion.  Patient is presently hemodynamically stable and is able to ambulate with assistance.   #1: Acute kidney injury on CKD: Renal indicis are stable at this time..   #2: Anemia: Patient may have anemia secondary to chronic kidney disease.  She had acute drop in hemoglobin and had 2 units of blood transfusion.   #3: Reversal of ileostomy: Presently stable.   #4: Hypertension/CHF: Continue present antihypertensive medications.  Patient is advised to stay on 2 g salt restricted diet with 1 L fluid restriction. Continue furosemide as ordered.   We will continue to monitor closely.   LOS: [place], [facility] 2/5/20232:07 PM

## 2021-11-19 NOTE — Progress Notes (Addendum)
°  Progress Note   Patient: Danielle [name] IRC:[id number] DOB: 06/28/1956 DOA: 10/23/2021     27 DOS: the patient was seen and examined on 11/19/2021   Brief hospital course: 66 year old woman admitted for elective ileostomy takedown.  Hospitalist service consulted for medical management.  Hospital course complicated by prolonged postoperative ileus, anemia requiring transfusion, aspiration pneumonia, AKI, acute on chronic systolic and diastolic CHF, large pleural effusion requiring thoracentesis.   --2/5 overall appears stable, appreciate management of AKI as per nephrology, acute on chronic hypoxic respiratory failure as per pulmonology.  CHF appears stable at this point, continue diuretic per nephrology.  Other comorbidities outlined below, otherwise appears medically stable.  Assessment and Plan: Ileus following reversal of ileostomy ([place]) --Resolved   AKI on CKD stage IV appears to be stable --Management per nephrology.  Appears stable.   Anemia of chronic disease -- Hemoglobin stable, 7.9.   Acute on chronic combined systolic and diastolic CHF (congestive heart failure) ([place]) --Previous EF 35 to 40% in 2019, since recovered, 60-65% this admission with grade I DD. --Compensated at this point.  I/O not accurate.  Continue diuretic as per nephrology.    AF (paroxysmal atrial fibrillation) ([place])- (present on admission) -Continue Eliquis, diltiazem on discharge. Has not required metoprolol PRN recently. Coreg stopped.  Midodrine was added earlier in hospitalization for mild low blood pressure, will stop today and monitor.   Acute on chronic respiratory failure with hypoxia (HCC) --Seen by pulmonology, thought secondary to right sided pleural effusion --appears stable   Aspiration pneumonia of right lower lobe due to gastric secretions ([place]) --resolved.  Treated with antibiotics.  Non-severe (moderate) malnutrition in context of chronic illness --Daily weights  --Ensure Enlive po TID  (chocolate/vanilla) with diet advancement, each supplement provides 350 kcal and 20 grams of protein  Aortic atherosclerosis       Subjective:  Feels fine  Physical Exam: Vitals:   11/18/21 2045 11/19/21 0418 11/19/21 0725 11/19/21 0852  BP: 99/73 107/83 107/79 101/73  Pulse: 87 83 99 86  Resp:  16 18 16   Temp:  98.4 F (36.9 C) 98.6 F (37 C) 98.2 F (36.8 C)  TempSrc:  Oral Oral Oral  SpO2:  92% 100% 94%  Weight:      Height:       Physical Exam Vitals reviewed.  Constitutional:      General: She is not in acute distress.    Appearance: She is not ill-appearing or toxic-appearing.  Cardiovascular:     Rate and Rhythm: Normal rate and regular rhythm.     Heart sounds: No murmur heard.    Comments: Telemetry SR Pulmonary:     Effort: Pulmonary effort is normal. No respiratory distress.     Breath sounds: No wheezing, rhonchi or rales.  Neurological:     Mental Status: She is alert.  Psychiatric:        Mood and Affect: Mood normal.        Behavior: Behavior normal.     Data Reviewed:  K+ 4.6, Creatinine 2.15  Family Communication: none  Disposition: Per General Surgery     Time spent: 25 minutes  Author: Murray Hodgkins, MD 11/19/2021 3:18 PM  For on call review [email or web address].

## 2021-11-20 MED ORDER — NEPRO/CARBSTEADY PO LIQD
237.0000 mL | Freq: Three times a day (TID) | ORAL | Status: DC
  Administered 2021-11-21 – 2021-11-22 (×2): 237 mL via ORAL

## 2021-11-20 MED ORDER — DILTIAZEM HCL ER COATED BEADS 120 MG PO CP24
120.0000 mg | ORAL_CAPSULE | Freq: Every day | ORAL | Status: DC
  Administered 2021-11-21 – 2021-11-22 (×2): 120 mg via ORAL
  Filled 2021-11-20 (×2): qty 1

## 2021-11-20 MED ORDER — SPIRONOLACTONE 25 MG PO TABS
25.0000 mg | ORAL_TABLET | Freq: Every day | ORAL | Status: DC
  Administered 2021-11-20: 25 mg via ORAL
  Filled 2021-11-20: qty 1

## 2021-11-20 MED ORDER — AMIODARONE HCL 200 MG PO TABS
200.0000 mg | ORAL_TABLET | Freq: Two times a day (BID) | ORAL | Status: DC
  Administered 2021-11-20 – 2021-11-24 (×9): 200 mg via ORAL
  Filled 2021-11-20 (×9): qty 1

## 2021-11-20 MED ORDER — MIDODRINE HCL 5 MG PO TABS
5.0000 mg | ORAL_TABLET | Freq: Three times a day (TID) | ORAL | Status: DC
  Administered 2021-11-20 – 2021-11-21 (×4): 5 mg via ORAL
  Filled 2021-11-20 (×5): qty 1

## 2021-11-20 NOTE — Progress Notes (Signed)
Nutrition Follow Up Note   DOCUMENTATION CODES:   Non-severe (moderate) malnutrition in context of chronic illness  INTERVENTION:   Nepro Shake po TID, each supplement provides 425 kcal and 19 grams protein  MVI po daily   NUTRITION DIAGNOSIS:   Moderate Malnutrition related to cancer and cancer related treatments as evidenced by mild fat depletion, mild muscle depletion, severe muscle depletion.  GOAL:   Patient will meet greater than or equal to 90% of their needs -progressing   MONITOR:   PO intake, Supplement acceptance, Weight trends, Labs, Skin, I & O's  ASSESSMENT:   66 y.o. 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 Pt reports good appetite and oral intake in hospital; pt documented to be eating 50-100% of meals. Pt reports that she does not like the [employer]. Pt usually drinks butter pecan Nepro but this is on backorder currently. Pt reports that she is willing to try the vanilla Nepro. RD discussed again with pt the importance of adequate protein intake needed to support post-op healing. Per chart, pt is up ~10lbs since admission but her weight seems to be coming back down now.   Medications reviewed and include: celexa, pepcid, lasix, insulin, MVI, vitamin D  Labs reviewed: Na 134(L), K 4.6 wnl, creat 2.15(H) Hgb 7.9(L), Hct 24.6(L)  Diet Order:    Diet Order             DIET SOFT Room service appropriate?  Yes; Fluid consistency: Thin  Diet effective now                  EDUCATION NEEDS:   Education needs have been addressed  Skin:  Skin Assessment: Skin Integrity Issues: Skin Integrity Issues:: Incisions Incisions: closed abdomen  Last BM:  2/5- TYPE 6  Height:   Ht Readings from Last 1 Encounters:  10/23/21 5\' 6"  (1.676 m)    Weight:   Wt Readings from Last 1 Encounters:  11/20/21 60.5 kg    Ideal Body Weight:  59 kg  BMI:  Body mass index is 21.53 kg/m.  Estimated Nutritional Needs:   Kcal:  1700-1900kcal/day  Protein:  85-95g/day  Fluid:  1.5L/day  [name] MS, RD, LDN Please refer to [facility] for RD and/or RD on-call/weekend/after hours pager

## 2021-11-20 NOTE — Progress Notes (Signed)
Physical Therapy Treatment Patient Details Name: Danielle [name] MRN: [id number] DOB: June 28, 1956 Today's Date: 11/20/2021   History of Present Illness 66 y/o female s/p iliostomy take down 1/09.    PT Comments    Pt is very fatigued this date, however agreeable to there-ex to improve strengthening. Pt on 2L of O2 upon arrival with sats at 80%. Increased to 3.5L for mobility. Able to performed sitting/standing there-ex along with breathing exercises. Pt fatigues quickly needing multiple rest breaks. O2 sats decrease to 81% on 3.5L with exertion and HR increases to 127bpm. Will update goals this date. ENcouraged pt to sit in recliner this date. Will continue to progress as able.   Recommendations for follow up therapy are one component of a multi-disciplinary discharge planning process, led by the attending physician.  Recommendations may be updated based on patient status, additional functional criteria and insurance authorization.  Follow Up Recommendations  Home health PT     Assistance Recommended at Discharge Intermittent Supervision/Assistance  Patient can return home with the following A little help with walking and/or transfers;A little help with bathing/dressing/bathroom;Assistance with cooking/housework;Assistance with feeding;Direct supervision/assist for medications management;Direct supervision/assist for financial management;Assist for transportation;Help with stairs or ramp for entrance   Equipment Recommendations  Rolling walker (2 wheels)    Recommendations for Other Services       Precautions / Restrictions Precautions Precautions: Fall Restrictions Weight Bearing Restrictions: No     Mobility  Bed Mobility Overal bed mobility: Modified Independent Bed Mobility: Supine to Sit     Supine to sit: Modified independent (Device/Increase time), HOB elevated Sit to supine: Modified independent (Device/Increase time)   General bed mobility comments: safe technique  with ease of transition to EOB    Transfers Overall transfer level: Needs assistance Equipment used: None Transfers: Sit to/from Stand Sit to Stand: Supervision           General transfer comment: requires use of B hands for transfers. Once standing, upright posture noted. All mobility performed on 3.5L of O2    Ambulation/Gait               General Gait Details: focused on strengthening ther-x, pt too fatigued to attempt ambulation this date   Stairs             Wheelchair Mobility    Modified Rankin (Stroke Patients Only)       Balance Overall balance assessment: Needs assistance Sitting-balance support: No upper extremity supported Sitting balance-Leahy Scale: Good     Standing balance support: Bilateral upper extremity supported Standing balance-Leahy Scale: Fair                              Cognition Arousal/Alertness: Awake/alert Behavior During Therapy: WFL for tasks assessed/performed Overall Cognitive Status: Within Functional Limits for tasks assessed                                 General Comments: Pt A and O x 4        Exercises Other Exercises Other Exercises: seated/standing ther-ex performed on B LE including LAQ, alt marching, standing heel raises, toe raises, and hip abd. 10 reps with heavy fatigue. HR increases to 127bpm with exertion and O2 sats at 83% consistently on 3.5L of O2. Other Exercises: IS performed x 10 reps reaching 268mL and flutter breathing performed x 10 reps.  General Comments        Pertinent Vitals/Pain Pain Assessment Pain Assessment: No/denies pain    Home Living                          Prior Function            PT Goals (current goals can now be found in the care plan section) Acute Rehab PT Goals Patient Stated Goal: go home PT Goal Formulation: With patient Time For Goal Achievement: 12/04/21 Potential to Achieve Goals: Fair Progress towards PT  goals: Progressing toward goals    Frequency    Min 2X/week      PT Plan Current plan remains appropriate    Co-evaluation              AM-PAC PT "6 Clicks" Mobility   Outcome Measure  Help needed turning from your back to your side while in a flat bed without using bedrails?: None Help needed moving from lying on your back to sitting on the side of a flat bed without using bedrails?: None Help needed moving to and from a bed to a chair (including a wheelchair)?: None Help needed standing up from a chair using your arms (e.g., wheelchair or bedside chair)?: None Help needed to walk in hospital room?: A Little Help needed climbing 3-5 steps with a railing? : A Little 6 Click Score: 22    End of Session Equipment Utilized During Treatment: Oxygen Activity Tolerance: Patient limited by fatigue Patient left: in bed (seated at EOB per patient request) Nurse Communication: Mobility status PT Visit Diagnosis: Muscle weakness (generalized) (M62.81);Difficulty in walking, not elsewhere classified (R26.2)     Time: 4709-6283 PT Time Calculation (min) (ACUTE ONLY): 15 min  Charges:  $Therapeutic Exercise: 8-22 mins                     Greggory Stallion, PT, DPT, GCS [phone]    Danielle [name] 11/20/2021, 10:50 AM

## 2021-11-20 NOTE — Progress Notes (Signed)
Patient ID: Danielle [name], female   DOB: 05-03-1956, 66 y.o.   MRN: [id number]     Danielle [name] Day(s): 28.   Interval History: Patient seen and examined, no acute events or new complaints overnight. Patient reports still feeling fatigue and shortness of breath when ambulating.  Today she ambulated and dropped her oxygen to 80% even on 3.5 L/min.  Her heart rate also went up to 127 during ambulation.  Patient did not tolerated to walk long distance.  No abdominal pain.  Tolerating diet.  Having bowel movement.  Vital signs in last 24 hours: [min-max] current  Temp:  [97.9 F (36.6 C)-98.6 F (37 C)] 98.6 F (37 C) (02/06 0829) Pulse Rate:  [86-99] 97 (02/06 1234) Resp:  [16-24] 16 (02/06 1234) BP: (85-101)/(57-76) 97/76 (02/06 1234) SpO2:  [91 %-96 %] 96 % (02/06 1039) Weight:  [60.5 kg] 60.5 kg (02/06 0605)     Height: 5\' 6"  (167.6 cm) Weight: 60.5 kg BMI (Calculated): 21.54   Physical Exam:  Constitutional: alert, cooperative and no distress  Respiratory: Fatigue Cardiovascular: Irregular rate and rhythm Gastrointestinal: soft, non-tender, and non-distended  Labs:  CBC Latest Ref Rng & Units 11/18/2021 11/17/2021 11/16/2021  WBC 4.0 - 10.5 K/uL 8.8 7.2 6.8  Hemoglobin 12.0 - 15.0 g/dL 7.9(L) 7.9(L) 7.7(L)  Hematocrit 36.0 - 46.0 % 24.6(L) 24.3(L) 24.3(L)  Platelets 150 - 400 K/uL 337 317 361   CMP Latest Ref Rng & Units 11/19/2021 11/18/2021 11/17/2021  Glucose 70 - 99 mg/dL 85 96 79  BUN 8 - 23 mg/dL 23 23 22   Creatinine 0.44 - 1.00 mg/dL 2.15(H) 1.98(H) 1.87(H)  Sodium 135 - 145 mmol/L 134(L) 138 138  Potassium 3.5 - 5.1 mmol/L 4.6 5.4(H) 4.6  Chloride 98 - 111 mmol/L 97(L) 100 101  CO2 22 - 32 mmol/L 29 32 29  Calcium 8.9 - 10.3 mg/dL 8.9 8.8(L) 8.6(L)  Total Protein 6.5 - 8.1 g/dL - - -  Total Bilirubin 0.3 - 1.2 mg/dL - - -  Alkaline Phos 38 - 126 U/L - - -  AST 15 - 41 U/L - - -  ALT 0 - 44 U/L - - -    Imaging studies: No new pertinent imaging  studies   Assessment/Plan:  66 y.o. female with history of an ileostomy and mucous fistula 27 Day Post-Op s/p ileostomy reversal, complicated by pertinent comorbidities including COPD, atrial fibrillation on anticoagulation, hypertension, chronic kidney disease stage III.   Status post ileostomy reversal -Patient initially with prolonged postoperative ileus that resolved. -Tolerating soft diet well -Passing gas and having bowel movements    Chronic kidney disease stage III -Acute kidney injury after surgery.   --Nephrology managing with diuretics.   Shortness of breath -Continue with significant shortness of breath and tachycardia on exertion. -O2 sat drops to 80% on exertion at 4 L/min -I discussed the case with pulmonology and he has assessed that a plan could be to increase her blood pressure with midodrine to increase blood flow to the kidneys to improve diuresis we will help with decreasing pleural effusion. -I considered that this is a good idea and appreciate his input and recommendations -This is what is keeping patient hospitalized.  Patient will not be able to go home safely.  She would be high risk of readmission if discharged in this condition.   A. Fib -Intermittent rate control -Currently on therapeutic anticoagulation -Cardiology signed off -Appreciate hospitalist recommendations.   Encourage patient to get out  of bed.  Will continue pain management.   Arnold Long, MD

## 2021-11-20 NOTE — Progress Notes (Addendum)
Progress Note    Danielle [name]  OZD:[id number] DOB: 1956/03/30  DOA: 10/23/2021 PCP: [facility] [place]      Brief Narrative:    Medical records reviewed and are as summarized below:   Brief hospital course: 66 year old woman with medical history significant for anemia, anxiety, aortic atherosclerosis, aortic stenosis, CKD stage IV, atrial fibrillation, breast cancer with history of chemotherapy and radiation therapy, COPD, depression, chronic diastolic and systolic CHF, GERD, hyperlipidemia, hypertension, lipidemia, small bowel obstruction, vitamin D deficiency.  She was admitted for elective ileostomy takedown.  Hospitalist service consulted for medical management.  Hospital course complicated by prolonged postoperative ileus, anemia requiring transfusion, aspiration pneumonia, AKI, acute on chronic systolic and diastolic CHF, large pleural effusion requiring thoracentesis.        Assessment/Plan:   Principal Problem:   Ileostomy status ([place]) Active Problems:   Hyponatremia   Acute respiratory failure with hypoxia (HCC)   Weakness   Acute kidney injury superimposed on CKD (HCC)   HTN (hypertension)   AF (paroxysmal atrial fibrillation) (HCC)   Normocytic anemia   Depression   Pleural effusion due to CHF (congestive heart failure) (HCC)   Hypokalemia   Acute on chronic combined systolic and diastolic CHF (congestive heart failure) (HCC)   Hyperphosphatemia   Hypomagnesemia   Anemia of chronic disease   Ileus following gastrointestinal surgery ([place])   Aspiration pneumonia of right lower lobe due to gastric secretions [facility])   Nutrition Problem: Moderate Malnutrition Etiology: cancer and cancer related treatments  Signs/Symptoms: mild fat depletion, mild muscle depletion, severe muscle depletion   Body mass index is 21.53 kg/m.   Ileus following reversal of ileostomy: Follow-up with general surgeon.  Acute on chronic hypoxic respiratory  failure: Oxygen saturation dropped to 81% on 3.5 L and heart rate went up to 127 while ambulating with PT.  Continue 4 L/min oxygen and taper down oxygen as able.  She uses 2 L/min oxygen at home as needed  Acute on chronic combined systolic and diastolic CHF: She is on IV Lasix per nephrologist.  Previous EF was 35 to 40% in 2019.  2D echo on this admission showed recovered EF estimated at 60 to 25%, grade 1 diastolic dysfunction.  AKI on CKD stage 3b: Creatinine is stable.  Follow-up with nephrologist.  Right pleural effusion: S/p thoracentesis with removal of 1 L of fluid 10/13/2022.  Hypotension: Continue midodrine  Paroxysmal atrial fibrillation: She was on carvedilol prior to admission but this has been held because of low blood pressure.   Continue Cardizem, amiodarone and Eliquis  COPD, ?  Interstitial lung disease on chest x-ray: Continue bronchodilators  Aspiration pneumonia of right lower lobe: Completed antibiotics  Anemia of chronic disease: H&H is stable       Diet Order             DIET SOFT Room service appropriate? Yes; Fluid consistency: Thin  Diet effective now                       Consultants: General surgeon Pulmonologist  Procedures: Laparoscopic ileostomy reversal on 10/23/2021    Medications:    amiodarone  200 mg Oral BID   apixaban  2.5 mg Oral BID   Chlorhexidine Gluconate Cloth  6 each Topical Daily   citalopram  40 mg Oral BH-q7a   [START ON 11/21/2021] diltiazem  120 mg Oral Daily   famotidine  20 mg Oral Daily   feeding supplement  1 Container Oral TID BM   fluticasone furoate-vilanterol  1 puff Inhalation Daily   And   umeclidinium bromide  1 puff Inhalation Daily   furosemide  40 mg Intravenous BID   gabapentin  300 mg Oral BID   midodrine  5 mg Oral TID WC   multivitamin with minerals  1 tablet Oral Daily   sodium chloride flush  10-40 mL Intracatheter Q12H   spironolactone  25 mg Oral Daily   Vitamin D (Ergocalciferol)   50,000 Units Oral Q7 days   Continuous Infusions:   Anti-infectives (From admission, onward)    Start     Dose/Rate Route Frequency Ordered Stop   11/07/21 0800  vancomycin (VANCOREADY) IVPB 1250 mg/250 mL  Status:  Discontinued        1,250 mg 166.7 mL/hr over 90 Minutes Intravenous Every 48 hours 11/05/21 0623 11/06/21 0954   11/06/21 1200  Ampicillin-Sulbactam (UNASYN) 3 g in sodium chloride 0.9 % 100 mL IVPB        3 g 200 mL/hr over 30 Minutes Intravenous Every 12 hours 11/06/21 0955 11/15/21 0055   11/05/21 0700  piperacillin-tazobactam (ZOSYN) IVPB 3.375 g  Status:  Discontinued        3.375 g 12.5 mL/hr over 240 Minutes Intravenous Every 8 hours 11/05/21 0609 11/06/21 0950   11/05/21 0700  vancomycin (VANCOREADY) IVPB 1500 mg/300 mL        1,500 mg 150 mL/hr over 120 Minutes Intravenous  Once 11/05/21 0610 11/05/21 1006   10/23/21 0624  sodium chloride 0.9 % with cefoTEtan (CEFOTAN) ADS Med       Note to Pharmacy: Jordan Hawks H: cabinet override      10/23/21 0624 10/23/21 0741   10/23/21 0615  cefoTEtan (CEFOTAN) 2 g in sodium chloride 0.9 % 100 mL IVPB        2 g 200 mL/hr over 30 Minutes Intravenous On call to O.R. 10/23/21 0608 10/23/21 0735              Family Communication/Anticipated D/C date and plan/Code Status   DVT prophylaxis: apixaban (ELIQUIS) tablet 2.5 mg Start: 11/14/21 2200 SCD's Start: 10/23/21 1801 apixaban (ELIQUIS) tablet 2.5 mg     Code Status: Full Code  Family Communication: None Disposition Plan: Plan to discharge home when medically stable   Status is: Inpatient  Remains inpatient appropriate because: On IV Lasix, hypoxia         Subjective:   Interval events noted.  No chest pain, shortness of breath or abdominal pain  Objective:    Vitals:   11/20/21 1024 11/20/21 1039 11/20/21 1214 11/20/21 1234  BP:  92/68  97/76  Pulse:  99  97  Resp: (!) 22 18 18 16   Temp:      TempSrc:      SpO2:  96%     Weight:      Height:       No data found.   Intake/Output Summary (Last 24 hours) at 11/20/2021 1308 Last data filed at 11/20/2021 1009 Gross per 24 hour  Intake 240 ml  Output --  Net 240 ml   Filed Weights   11/17/21 0449 11/18/21 0500 11/20/21 0605  Weight: 62.3 kg 62.4 kg 60.5 kg    Exam:  GEN: NAD SKIN: No rash EYES: EOMI ENT: MMM CV: RRR PULM: CTA B ABD: soft, ND, NT, +BS CNS: AAO x 3, non focal EXT: No edema or tenderness        Data  Reviewed:   I have personally reviewed following labs and imaging studies:  Labs: Labs show the following:   Basic Metabolic Panel: Recent Labs  Lab 11/15/21 1027 11/16/21 0506 11/17/21 0511 11/18/21 0510 11/19/21 0905  NA 137 141 138 138 134*  K 3.2* 3.8 4.6 5.4* 4.6  CL 100 103 101 100 97*  CO2 30 31 29  32 29  GLUCOSE 120* 84 79 96 85  BUN 23 22 22 23 23   CREATININE 1.78* 1.75* 1.87* 1.98* 2.15*  CALCIUM 8.5* 8.5* 8.6* 8.8* 8.9  MG  --   --  1.3* 2.0  --   PHOS  --   --  3.8  --  4.6   GFR Estimated Creatinine Clearance: 23.8 mL/min (A) (by C-G formula based on SCr of 2.15 mg/dL (H)). Liver Function Tests: Recent Labs  Lab 11/17/21 0511 11/19/21 0905  ALBUMIN 2.7* 2.9*   No results for input(s): LIPASE, AMYLASE in the last 168 hours. No results for input(s): AMMONIA in the last 168 hours. Coagulation profile No results for input(s): INR, PROTIME in the last 168 hours.  CBC: Recent Labs  Lab 11/15/21 1027 11/16/21 0506 11/17/21 0511 11/18/21 0510  WBC 7.9 6.8 7.2 8.8  HGB 8.0* 7.7* 7.9* 7.9*  HCT 24.8* 24.3* 24.3* 24.6*  MCV 94.7 95.7 94.9 94.6  PLT 321 361 317 337   Cardiac Enzymes: No results for input(s): CKTOTAL, CKMB, CKMBINDEX, TROPONINI in the last 168 hours. BNP (last 3 results) No results for input(s): PROBNP in the last 8760 hours. CBG: Recent Labs  Lab 11/14/21 2021 11/19/21 0727  GLUCAP 109* 80   D-Dimer: No results for input(s): DDIMER in the last 72 hours. Hgb  A1c: No results for input(s): HGBA1C in the last 72 hours. Lipid Profile: No results for input(s): CHOL, HDL, LDLCALC, TRIG, CHOLHDL, LDLDIRECT in the last 72 hours. Thyroid function studies: No results for input(s): TSH, T4TOTAL, T3FREE, THYROIDAB in the last 72 hours.  Invalid input(s): FREET3 Anemia work up: No results for input(s): VITAMINB12, FOLATE, FERRITIN, TIBC, IRON, RETICCTPCT in the last 72 hours. Sepsis Labs: Recent Labs  Lab 11/15/21 1027 11/16/21 0506 11/17/21 0511 11/18/21 0510  WBC 7.9 6.8 7.2 8.8    Microbiology Recent Results (from the past 240 hour(s))  Body fluid culture w Gram Stain     Status: None   Collection Time: 11/13/21 11:00 AM   Specimen: PATH Cytology Pleural fluid  Result Value Ref Range Status   Specimen Description   Final    PLEURAL Performed at [facility], [address], [place], [place] [address]    Special Requests   Final    NONE Performed at [facility], [address] [place], [place] [address]    Gram Stain   Final    WBC PRESENT,BOTH PMN AND MONONUCLEAR NO ORGANISMS SEEN CYTOSPIN SMEAR    Culture   Final    NO GROWTH 3 DAYS Performed at [facility], [place] [address], [place], [place] [address]    Report Status 11/16/2021 FINAL  Final    Procedures and diagnostic studies:  No results found.             LOS: 28 days   [employer], advertising on [email or web address]. If 7PM-7AM, please contact night-coverage at [email or web address]     11/20/2021, 1:08 PM

## 2021-11-20 NOTE — Progress Notes (Signed)
[facility]  PROGRESS NOTE   Subjective:   Patient seen resting quietly in bed Tolerating meals without nausea and vomiting Denies shortness of breath, remains on 4 L [place] Continues to complain of shortness of breath with activity  Objective:  Vital signs in last 24 hours:  Temp:  [97.9 F (36.6 C)-98.6 F (37 C)] 98.6 F (37 C) (02/06 0829) Pulse Rate:  [86-99] 97 (02/06 1234) Resp:  [16-24] 16 (02/06 1234) BP: (85-101)/(57-76) 97/76 (02/06 1234) SpO2:  [91 %-96 %] 96 % (02/06 1039) Weight:  [60.5 kg] 60.5 kg (02/06 0605)  Weight change:  Filed Weights   11/17/21 0449 11/18/21 0500 11/20/21 0605  Weight: 62.3 kg 62.4 kg 60.5 kg    Intake/Output: I/O last 3 completed shifts: In: 200 [P.O.:200] Out: 200 [Urine:200]   Intake/Output this shift:  Total I/O In: 240 [P.O.:240] Out: -   Physical Exam: General:  No acute distress  Head:  Normocephalic, atraumatic. Moist oral mucosal membranes  Eyes:  Anicteric  Lungs:   Clear to auscultation, normal effort  Heart:  S1S2 no rubs  Abdomen:   Soft, nontender, bowel sounds present  Extremities:  peripheral edema.  Neurologic:  Awake, alert, following commands  Skin:  No lesions       Basic Metabolic Panel: Recent Labs  Lab 11/15/21 1027 11/16/21 0506 11/17/21 0511 11/18/21 0510 11/19/21 0905  NA 137 141 138 138 134*  K 3.2* 3.8 4.6 5.4* 4.6  CL 100 103 101 100 97*  CO2 30 31 29  32 29  GLUCOSE 120* 84 79 96 85  BUN 23 22 22 23 23   CREATININE 1.78* 1.75* 1.87* 1.98* 2.15*  CALCIUM 8.5* 8.5* 8.6* 8.8* 8.9  MG  --   --  1.3* 2.0  --   PHOS  --   --  3.8  --  4.6     CBC: Recent Labs  Lab 11/15/21 1027 11/16/21 0506 11/17/21 0511 11/18/21 0510  WBC 7.9 6.8 7.2 8.8  HGB 8.0* 7.7* 7.9* 7.9*  HCT 24.8* 24.3* 24.3* 24.6*  MCV 94.7 95.7 94.9 94.6  PLT 321 361 317 337      Urinalysis: No results for input(s): COLORURINE, LABSPEC, PHURINE, GLUCOSEU, HGBUR, BILIRUBINUR, KETONESUR, PROTEINUR,  UROBILINOGEN, NITRITE, LEUKOCYTESUR in the last 72 hours.  Invalid input(s): APPERANCEUR    Imaging: No results found.   Medications:     amiodarone  200 mg Oral BID   apixaban  2.5 mg Oral BID   Chlorhexidine Gluconate Cloth  6 each Topical Daily   citalopram  40 mg Oral BH-q7a   [START ON 11/21/2021] diltiazem  120 mg Oral Daily   famotidine  20 mg Oral Daily   feeding supplement  1 Container Oral TID BM   fluticasone furoate-vilanterol  1 puff Inhalation Daily   And   umeclidinium bromide  1 puff Inhalation Daily   furosemide  40 mg Intravenous BID   gabapentin  300 mg Oral BID   midodrine  5 mg Oral TID WC   multivitamin with minerals  1 tablet Oral Daily   sodium chloride flush  10-40 mL Intracatheter Q12H   spironolactone  25 mg Oral Daily   Vitamin D (Ergocalciferol)  50,000 Units Oral Q7 days    Assessment/ Plan:     Principal Problem:   Ileostomy status ([place]) Active Problems:   Hyponatremia   Acute respiratory failure with hypoxia (HCC)   Weakness   Acute kidney injury superimposed on CKD ([place])  HTN (hypertension)   AF (paroxysmal atrial fibrillation) (HCC)   Normocytic anemia   Depression   Pleural effusion due to CHF (congestive heart failure) (HCC)   Hypokalemia   Acute on chronic combined systolic and diastolic CHF (congestive heart failure) (HCC)   Hyperphosphatemia   Hypomagnesemia   Anemia of chronic disease   Ileus following gastrointestinal surgery ([place])   Aspiration pneumonia of right lower lobe due to gastric secretions [facility])  66 year old female with history of hypertension, coronary artery disease, atrial fibrillation, COPD, chronic kidney disease stage IIIb and history of ileostomy now s/p ileostomy reversal. Postoperative course complicated by ileus development and acute kidney injury on the top of chronic kidney disease.  She also developed anemia requiring 2 units of blood transfusion.  Patient is presently hemodynamically stable and is  able to ambulate with assistance.   #1: Acute kidney injury on CKD:  Baseline Creatnine 1.25 / GFR  on 10/30/2021. Creatinine remains stable at 2.15. Based on needed diuresis at this time, current creatinine may be considered new baseline.  We will continue to monitor   #2: Anemia: Patient may have anemia secondary to chronic kidney disease.  Patient has received blood transfusions during this admission.  Last hemoglobin 7.9   #3:  Status post elective ileostomy reversal complicated by ileus resulting in short-term TPN and NG tube.  Tolerating meals and passing flatus.  Pain management and mobility.  Surgery following   #4: Hypertension/CHF: Continue present antihypertensive medications.  Currently receiving low-dose midodrine with furosemide and spironolactone. BP currently 97/76 Level   LOS: [facility] 2/6/20232:04 PM

## 2021-11-20 NOTE — Progress Notes (Signed)
PULMONOLOGY         Date: 11/20/2021,   MRN# [id number] Danielle [name] [birth year]     AdmissionWeight: 56.2 kg                 CurrentWeight: 60.5 kg   Referring physician: Dr Peyton Najjar   CHIEF COMPLAINT:   Increased O2 requirement.   HISTORY OF PRESENT ILLNESS   This is a pleasant 66 yo with complex comorbid history including, COPD, AF, HTN, CKD3, s/p ileostomy reversal with persistent increase O2 req and notable pleural effusion. She is s/p thoracentesis yielding 1L fluid drained from right pleural space.  She is on supplemental O2 and feels improved.    11/14/21 - patient received VEST therapy yesterday with RT and felt that it may be helping. We have repeat CXR scheduled for today. She took few breaths on IS up to 651m tidal volume. We discussed importance of PT/OT and she agrees to work with them for strength training. Yesterday she had big BM during my visit along with physical therapist. She remains on 3.5L/min and Ive discussed with RN to wean that down as able with goal spO2 >88%.  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     11/16/21- patient is further improved with O2 weaned to 2L/min during my evaluation today.  11/18/21- patient still hospitalized and has increased O2 requirement at 3L/min now.  She cannot walk without desaturation.  I think this is related to fluid balance and renal failure. CXR with bilateral effusions and pulmonary edema.  Renal function is worse this am.  Im gonna increase her blood pressure today and this should help with diuresis and we should continue PT to treat atelectatic segments.   11/19/21- patient is about same as yesterday, clinically however she does seem improved.    11/20/21- Patient seen and examined at bedside. She remains with hypoxemia. Plan has been reviewed with team and I started amio 200 bid PO today, reduced cardizem to  120 stopped metoprolol , started midodrine tid  PAST MEDICAL HISTORY   Past Medical History:  Diagnosis Date   Anemia    Anxiety    Aortic atherosclerosis ([facility]    Aortic valve stenosis 02/10/2018   a.) TTE 02/10/2018: EF 55-60%: mild AS with MPG of 12 mmHg. b.) TTE 04/21/2018: EF 35-40%; mild AS with MPG 8 mmHg. c.) TTE 11/07/2019: EF 50-55%; mild AS with MPG 10 mmHg.   Arthritis    Atrial fibrillation (HCC)    a.) CHA2DS2-VASc = 4 (age, sex, HTN, aortic plaque). b.) Rate/rhythm maintained on oral carvedilol; chronically anticoagulated on dose reduced apixaban. c.)  Attempted deployment of LAA occlusive device on 01/11/2021; parameters failed and procedure aborted.   Breast cancer, left ([facility] 2000   a.) T2N1M0; ER/PR (+) --> Tx'd with total mastectomy, LN resection, XRT, and chemotherapy   Cancer of right lung ([facility] 07/30/2016   a.) adenocarcinoma; ALK, ROS1, PDL1, BRAF, EGFR all negative.   CKD (chronic kidney disease), stage IV (HCC)    COPD (chronic obstructive pulmonary disease) (HCC)    Dependence on supplemental oxygen    Depression    Diastolic dysfunction 038/18/2993  a.) TTE 02/10/2017: EF 55-60%; G2DD. b.) TTE 04/21/2018: EF 35-40%; mild LA dilation; mod MV regurgitation. c.) TTE 11/07/2019: EF 50-55%; G1DD.   DOE (dyspnea on exertion)    GERD (gastroesophageal reflux disease)    Heart murmur  History of 2019 novel coronavirus disease (COVID-19) 10/14/2019   HLD (hyperlipidemia)    Hypertension    Long term current use of anticoagulant    a.) apixaban   Lymphedema    Personal history of chemotherapy    Personal history of radiation therapy    SBO (small bowel obstruction) ([place]) 11/05/2020   Vitamin D deficiency      SURGICAL HISTORY   Past Surgical History:  Procedure Laterality Date   Breast Biospy Left    [facility]   BREAST SURGERY     COLONOSCOPY N/A 04/30/2018   Procedure: COLONOSCOPY;  Surgeon: Virgel Manifold, MD;  Location: [facility] ENDOSCOPY;  Service:  Endoscopy;  Laterality: N/A;   COLONOSCOPY N/A 07/22/2018   Procedure: COLONOSCOPY;  Surgeon: Virgel Manifold, MD;  Location: [facility] ENDOSCOPY;  Service: Endoscopy;  Laterality: N/A;   COLONOSCOPY WITH PROPOFOL N/A 09/21/2021   Procedure: COLONOSCOPY WITH PROPOFOL;  Surgeon: Benjamine Sprague, DO;  Location: [facility] ENDOSCOPY;  Service: General;  Laterality: N/A;   DILATION AND CURETTAGE OF UTERUS     ELECTROMAGNETIC NAVIGATION BROCHOSCOPY Right 04/11/2016   Procedure: ELECTROMAGNETIC NAVIGATION BRONCHOSCOPY;  Surgeon: Vilinda Boehringer, MD;  Location: [facility] ORS;  Service: Cardiopulmonary;  Laterality: Right;   ESOPHAGOGASTRODUODENOSCOPY N/A 07/22/2018   Procedure: ESOPHAGOGASTRODUODENOSCOPY (EGD);  Surgeon: Virgel Manifold, MD;  Location: [facility] ENDOSCOPY;  Service: Endoscopy;  Laterality: N/A;   ESOPHAGOGASTRODUODENOSCOPY (EGD) WITH PROPOFOL N/A 05/07/2018   Procedure: ESOPHAGOGASTRODUODENOSCOPY (EGD) WITH PROPOFOL;  Surgeon: Lucilla Lame, MD;  Location: [facility] ENDOSCOPY;  Service: Endoscopy;  Laterality: N/A;   ESOPHAGOGASTRODUODENOSCOPY (EGD) WITH PROPOFOL N/A 04/24/2019   Procedure: ESOPHAGOGASTRODUODENOSCOPY (EGD) WITH PROPOFOL;  Surgeon: Jonathon Bellows, MD;  Location: [facility] ENDOSCOPY;  Service: Gastroenterology;  Laterality: N/A;   ESOPHAGOGASTRODUODENOSCOPY (EGD) WITH PROPOFOL N/A 01/12/2020   Procedure: ESOPHAGOGASTRODUODENOSCOPY (EGD) WITH PROPOFOL;  Surgeon: Jonathon Bellows, MD;  Location: [facility] ENDOSCOPY;  Service: Gastroenterology;  Laterality: N/A;   ESOPHAGOGASTRODUODENOSCOPY (EGD) WITH PROPOFOL N/A 04/28/2020   Procedure: ESOPHAGOGASTRODUODENOSCOPY (EGD) WITH PROPOFOL;  Surgeon: Jonathon Bellows, MD;  Location: [facility] ENDOSCOPY;  Service: Gastroenterology;  Laterality: N/A;   EUS N/A 05/07/2019   Procedure: FULL UPPER ENDOSCOPIC ULTRASOUND (EUS) RADIAL;  Surgeon: Jola Schmidt, MD;  Location: [facility] ENDOSCOPY;  Service: Endoscopy;  Laterality: N/A;   ILEOSCOPY N/A 07/22/2018   Procedure: ILEOSCOPY THROUGH  STOMA;  Surgeon: Virgel Manifold, MD;  Location: [facility] ENDOSCOPY;  Service: Endoscopy;  Laterality: N/A;   ILEOSTOMY     ILEOSTOMY N/A 09/08/2018   Procedure: ILEOSTOMY REVISION POSSIBLE CREATION;  Surgeon: Herbert Pun, MD;  Location: [facility] ORS;  Service: General;  Laterality: N/A;   ILEOSTOMY CLOSURE N/A 08/15/2018   Procedure: DILATION OF ILEOSTOMY STRICTURE;  Surgeon: Herbert Pun, MD;  Location: [facility] ORS;  Service: General;  Laterality: N/A;   LAPAROTOMY Right 05/04/2018   Procedure: EXPLORATORY LAPAROTOMY right colectomy right and left ostomy;  Surgeon: Herbert Pun, MD;  Location: [facility] ORS;  Service: General;  Laterality: Right;   LEFT ATRIAL APPENDAGE OCCLUSION N/A 01/11/2021   Procedure: LEFT ATRIAL APPENDAGE OCCLUSION ([place]); ABORTED PROCEDURE WITHOUT DEVICE BEING IMPLANTED; Location: [facility]; Surgeon: Mylinda Latina, MD   LUNG BIOPSY     MASTECTOMY Left    2000, [place]   ROTATOR CUFF REPAIR Right    [place] TAKEDOWN N/A 10/23/2021   Procedure: XI ROBOTIC ASSISTED ILEOSTOMY TAKEDOWN;  Surgeon: Herbert Pun, MD;  Location: [facility] ORS;  Service: General;  Laterality: N/A;  180 minutes for the surgery part please  FAMILY HISTORY   Family History  Problem Relation Age of Onset   Breast cancer Mother 31   Cancer Mother        Breast    Cirrhosis Father    Breast cancer Paternal Aunt 21   Cancer Maternal Aunt        Breast      SOCIAL HISTORY   Social History   Tobacco Use   Smoking status: Former    Packs/day: 0.50    Years: 20.00    Pack years: 10.00    Types: Cigarettes    Quit date: 07/02/2012    Years since quitting: 9.3   Smokeless tobacco: Current    Types: Snuff   Tobacco comments:    quit 2014  Vaping Use   Vaping Use: Never used  Substance Use Topics   Alcohol use: Yes    Comment: Occasionally beer   Drug use: No     MEDICATIONS    Home Medication:    Current  Medication:  Current Facility-Administered Medications:    albuterol (PROVENTIL) (2.5 MG/3ML) 0.083% nebulizer solution 3 mL, 3 mL, Nebulization, Q6H PRN, [name]-Diaz, Edgardo, MD, 3 mL at 11/14/21 0737   alum & mag hydroxide-simeth (MAALOX/MYLANTA) 200-200-20 MG/5ML suspension 15-30 mL, 15-30 mL, Oral, Q4H PRN, [name]-Diaz, Edgardo, MD, 30 mL at 11/14/21 1500   apixaban (ELIQUIS) tablet 2.5 mg, 2.5 mg, Oral, BID, Dallie Piles, RPH, 2.5 mg at 11/19/21 2104   Chlorhexidine Gluconate Cloth 2 % PADS 6 each, 6 each, Topical, Daily, Herbert Pun, MD, 6 each at 11/19/21 0903   citalopram (CELEXA) tablet 40 mg, 40 mg, Oral, BH-q7a, [name]-Diaz, Reeves Forth, MD, 40 mg at 11/20/21 0606   diltiazem (CARDIZEM CD) 24 hr capsule 240 mg, 240 mg, Oral, Daily, Tang, Lily Michelle, PA-C, 240 mg at 11/19/21 0539   famotidine (PEPCID) tablet 20 mg, 20 mg, Oral, Daily, [name]-Diaz, Edgardo, MD, 20 mg at 11/19/21 0903   feeding supplement (BOOST / RESOURCE BREEZE) liquid 1 Container, 1 Container, Oral, TID BM, [name]-Diaz, Edgardo, MD, 1 Container at 11/19/21 0903   fluticasone furoate-vilanterol (BREO ELLIPTA) 100-25 MCG/ACT 1 puff, 1 puff, Inhalation, Daily, 1 puff at 11/19/21 0903 **AND** umeclidinium bromide (INCRUSE ELLIPTA) 62.5 MCG/ACT 1 puff, 1 puff, Inhalation, Daily, Benita Gutter, RPH, 1 puff at 11/19/21 0904   furosemide (LASIX) injection 40 mg, 40 mg, Intravenous, BID, Samuella Cota, MD, 40 mg at 11/19/21 1727   gabapentin (NEURONTIN) capsule 300 mg, 300 mg, Oral, BID, [name]-Diaz, Edgardo, MD, 300 mg at 11/19/21 2104   HYDROmorphone (DILAUDID) injection 0.5 mg, 0.5 mg, Intravenous, Q2H PRN, Piscoya, Jose, MD, 0.5 mg at 11/09/21 1607   ipratropium-albuterol (DUONEB) 0.5-2.5 (3) MG/3ML nebulizer solution 3 mL, 3 mL, Nebulization, Q6H PRN, Herbert Pun, MD   lip balm (BLISTEX) ointment, , Topical, PRN, Herbert Pun, MD, Given at 11/06/21 0905   menthol-cetylpyridinium  (CEPACOL) lozenge 3 mg, 1 lozenge, Oral, PRN, Herbert Pun, MD, 3 mg at 11/07/21 0522   metoprolol tartrate (LOPRESSOR) injection 5 mg, 5 mg, Intravenous, Q6H PRN, Sharion Settler, NP, 5 mg at 10/31/21 1553   multivitamin with minerals tablet 1 tablet, 1 tablet, Oral, Daily, [name]-Diaz, Reeves Forth, MD, 1 tablet at 11/19/21 0903   ondansetron (ZOFRAN-ODT) disintegrating tablet 4 mg, 4 mg, Oral, Q6H PRN, 4 mg at 11/07/21 1255 **OR** ondansetron (ZOFRAN) injection 4 mg, 4 mg, Intravenous, Q6H PRN, Herbert Pun, MD, 4 mg at 10/29/21 0051   oxyCODONE-acetaminophen (PERCOCET/ROXICET) 5-325 MG per tablet 1-2 tablet, 1-2 tablet,  Oral, Q4H PRN, Herbert Pun, MD, 2 tablet at 11/19/21 1456   sodium chloride (OCEAN) 0.65 % nasal spray 1 spray, 1 spray, Each Nare, PRN, Sharion Settler, NP, 1 spray at 11/12/21 1206   sodium chloride flush (NS) 0.9 % injection 10-40 mL, 10-40 mL, Intracatheter, Q12H, [name]-Diaz, Edgardo, MD, 10 mL at 11/19/21 2104   sodium chloride flush (NS) 0.9 % injection 10-40 mL, 10-40 mL, Intracatheter, PRN, Herbert Pun, MD   Vitamin D (Ergocalciferol) (DRISDOL) capsule 50,000 Units, 50,000 Units, Oral, Q7 days, Murlean Iba, MD, 50,000 Units at 11/13/21 1308    ALLERGIES   Patient has no known allergies.     REVIEW OF SYSTEMS    Review of Systems:  Gen:  Denies  fever, sweats, chills weigh loss  HEENT: Denies blurred vision, double vision, ear pain, eye pain, hearing loss, nose bleeds, sore throat Cardiac:  No dizziness, chest pain or heaviness, chest tightness,edema Resp:   Denies cough or sputum porduction, shortness of breath,wheezing, hemoptysis,  Gi: Denies swallowing difficulty, stomach pain, nausea or vomiting, diarrhea, constipation, bowel incontinence Gu:  Denies bladder incontinence, burning urine Ext:   Denies Joint pain, stiffness or swelling Skin: Denies  skin rash, easy bruising or bleeding or hives Endoc:  Denies  polyuria, polydipsia , polyphagia or weight change Psych:   Denies depression, insomnia or hallucinations   Other:  All other systems negative   VS: BP (!) 89/64 (BP Location: Left Leg)    Pulse 92    Temp 98.6 F (37 C) (Oral)    Resp 16    Ht _0  (1.676 m)    Wt 60.5 kg    SpO2 91%    BMI 21.53 kg/m      PHYSICAL EXAM    GENERAL:NAD, no fevers, chills, no weakness no fatigue HEAD: Normocephalic, atraumatic.  EYES: Pupils equal, round, reactive to light. Extraocular muscles intact. No scleral icterus.  MOUTH: Moist mucosal membrane. Dentition intact. No abscess noted.  EAR, NOSE, THROAT: Clear without exudates. No external lesions.  NECK: Supple. No thyromegaly. No nodules. No JVD.  PULMONARY: mild rhochi worse on right CARDIOVASCULAR: S1 and S2. Regular rate and rhythm. No murmurs, rubs, or gallops. No edema. Pedal pulses 2+ bilaterally.  GASTROINTESTINAL: Soft, nontender, nondistended. No masses. Positive bowel sounds. No hepatosplenomegaly.  MUSCULOSKELETAL: No swelling, clubbing, or edema. Range of motion full in all extremities.  NEUROLOGIC: Cranial nerves II through XII are intact. No gross focal neurological deficits. Sensation intact. Reflexes intact.  SKIN: No ulceration, lesions, rashes, or cyanosis. Skin warm and dry. Turgor intact.  PSYCHIATRIC: Mood, affect within normal limits. The patient is awake, alert and oriented x 3. Insight, judgment intact.       IMAGING    CT ABDOMEN PELVIS WO CONTRAST  Result Date: 11/05/2021 CLINICAL DATA:  Sepsis, fever, postoperative, rule out intra-abdominal abscess EXAM: CT ABDOMEN AND PELVIS WITHOUT CONTRAST TECHNIQUE: Multidetector CT imaging of the abdomen and pelvis was performed following the standard protocol without IV contrast. Oral enteric contrast was administered. RADIATION DOSE REDUCTION: This exam was performed according to the departmental dose-optimization program which includes automated exposure control, adjustment  of the mA and/or kV according to patient size and/or use of iterative reconstruction technique. COMPARISON:  10/27/2021 FINDINGS: Lower chest: Small right pleural effusion associated atelectasis or consolidation. Three-vessel coronary artery calcifications. Hepatobiliary: No solid liver abnormality is seen. No gallstones, gallbladder wall thickening, or biliary dilatation. Pancreas: Unremarkable. No pancreatic ductal dilatation or surrounding inflammatory changes. Spleen: Normal in  size without significant abnormality. Adrenals/Urinary Tract: Adrenal glands are unremarkable. Kidneys are normal, without renal calculi, solid lesion, or hydronephrosis. Bladder is unremarkable. Stomach/Bowel: Stomach is within normal limits. The mid small bowel is diffusely gas and contrast filled, and mildly distended, largest loops measuring 4.1 cm. Status post right hemicolectomy. Descending and sigmoid diverticula. Vascular/Lymphatic: Aortic atherosclerosis. No enlarged abdominal or pelvic lymph nodes. Reproductive: No mass or other significant abnormality. Other: Anasarca. Persistent, although improved, scattered subcutaneous emphysema about the abdominal wall (series 2, image 38). Unchanged subcutaneous fluid collections of the left and right ventral abdomen, collection on the left measuring 3.4 x 2.2 cm (series 2, image 36), collection on the right measuring 3.5 x 2.0 cm (series 2, image 43). No ascites. Musculoskeletal: No acute or significant osseous findings. IMPRESSION: 1. Unchanged subcutaneous fluid collections of the left and right ventral abdomen, collection on the left measuring 3.4 x 2.2 cm, collection on the right measuring 3.5 x 2.0 cm. These most likely reflect postoperative hematoma or seroma, although presence or absence of infection within this fluid is not established by CT. 2. Status post right hemicolectomy. 3. The mid small bowel is diffusely gas and contrast filled, and mildly distended, largest loops  measuring 4.1 cm. Findings are most consistent with postoperative ileus. 4. Right pleural effusion and anasarca. 5. Coronary artery disease. Aortic Atherosclerosis (ICD10-I70.0). Electronically Signed   By: Delanna Ahmadi M.D.   On: 11/05/2021 17:34   CT ABDOMEN PELVIS WO CONTRAST  Result Date: 10/27/2021 CLINICAL DATA:  Postop day 4 from ileostomy reversal. Vomiting. Evaluate for anastomotic leak. EXAM: CT ABDOMEN AND PELVIS WITHOUT CONTRAST TECHNIQUE: Multidetector CT imaging of the abdomen and pelvis was performed following the standard protocol without IV contrast. RADIATION DOSE REDUCTION: This exam was performed according to the departmental dose-optimization program which includes automated exposure control, adjustment of the mA and/or kV according to patient size and/or use of iterative reconstruction technique. COMPARISON:  Abdominopelvic CT 11/05/2020.  Radiographs 10/26/2021. FINDINGS: Study was performed with the patient in the right lateral decubitus position. Rectal contrast was administered. Lower chest: Apparent right middle lobe collapse with a small amount of adjacent pleural fluid, incompletely visualized. Mild dependent atelectasis in both lower lobes. There is atherosclerosis of the aorta and coronary arteries. Hepatobiliary: Stable low-density liver lesions, likely cysts based on stability. Stable mild biliary dilatation status post cholecystectomy. Pancreas: Unremarkable. No pancreatic ductal dilatation or surrounding inflammatory changes. Spleen: Normal in size without focal abnormality. Adrenals/Urinary Tract: Both adrenal glands appear normal. Both kidneys demonstrate mild perinephric soft tissue stranding, similar to previous study. No evidence of urinary tract calculus or hydronephrosis. The bladder appears unremarkable for its degree of distention. Stomach/Bowel: No oral contrast was administered. Rectal contrast has passed retrograde through the colon and through the ileocolonic  anastomosis at the level of the mid transverse colon status post right hemicolectomy. There is retrograde filling of the distal small bowel. No anastomotic leak. The stomach and small bowel are fluid-filled and mildly distended. No evidence of bowel wall thickening or pneumatosis. Vascular/Lymphatic: There are no enlarged abdominal or pelvic lymph nodes. Diffuse aortic and branch vessel atherosclerosis. Reproductive: Stable appearance of the uterus and adnexa. No adnexal mass. Tubal ligation clips noted. Other: As seen on the postoperative radiographs, there is a large amount of soft tissue emphysema throughout the anterior abdominal wall which extends from the perineum into the presternal soft tissues. There are small focal fluid collections within the right and left anterior abdominal wall, likely related to recent  laparoscopic procedure. No free intraperitoneal air, ascites or focal extraluminal fluid collections identified within the peritoneal cavity. Musculoskeletal: No acute or significant osseous findings. Stable chronic mild superior endplate compression fractures at L3 and L5. IMPRESSION: 1. Status post recent ileostomy reversal and ileocolonic anastomosis. The anastomosis is patent, without leak. 2. Nonspecific gastric and proximal small bowel distension without focal transition point, likely related to a postoperative ileus. Recommend radiographic follow-up. Patient may benefit from a nasogastric tube. 3. As seen on postoperative radiographs, there is extensive soft tissue emphysema throughout the abdominal wall which is likely related to the laparoscopic procedure. Soft tissue infection should be excluded clinically. Small fluid collections in the anterior abdominal wall are attributed to the procedure, and no unexpected collections are identified. There is no pneumoperitoneum or abnormal intra-abdominal fluid collection. 4. Atelectasis at both lung bases with apparent partial right middle lobe  collapse. 5. Extensive Aortic Atherosclerosis (ICD10-I70.0). Electronically Signed   By: Richardean Sale M.D.   On: 10/27/2021 16:28   DG Chest 1 View  Result Date: 10/26/2021 CLINICAL DATA:  Hypotension EXAM: CHEST  1 VIEW COMPARISON:  11/16/2020, 06/05/2020, PET CT 12/01/2019, CT chest 11/06/2019, 11/06/2019 FINDINGS: Mild cardiomegaly with central vascular congestion. Small pleural effusions and diffuse increased interstitial opacity likely due to edema. Aortic atherosclerosis. No pneumothorax. Irregular opacity with mild distortion and nodularity in the right upper lobe and right hilus similar compared to intermittent previous exams and presumably due to scarring. IMPRESSION: 1. Cardiomegaly with vascular congestion, small pleural effusions and mild diffuse interstitial edema 2. Irregular and nodular opacities in the right hilus and upper lobe present on previous exams and probably due to scarring Electronically Signed   By: Donavan Foil M.D.   On: 10/26/2021 17:54   DG Chest 2 View  Result Date: 11/18/2021 CLINICAL DATA:  Admitted for acute kidney injury, continued fatigue. Recent pleural effusion on RIGHT. EXAM: CHEST - 2 VIEW COMPARISON:  Chest x-rays dated 11/16/2021, 11/14/2021, 01/20/2020 and 07/31/2020. FINDINGS: Heart size and mediastinal contours are stable. Chronic scarring is again within the RIGHT perihilar lung. Coarse lung markings are seen throughout both lungs. No confluent opacities to suggest superimposed pneumonia. Small bilateral pleural effusions, RIGHT greater than LEFT. No pneumothorax is seen. RIGHT-sided PICC line appears well positioned with tip at the level of the lower SVC/cavoatrial junction. No acute or suspicious osseous abnormality. Extensive aortic atherosclerosis. Mildly distended gas-filled loops of small bowel within the upper abdomen, incompletely imaged. IMPRESSION: 1. Small bilateral pleural effusions, RIGHT greater than LEFT. 2. Coarse lung markings throughout both  lungs, compatible with chronic interstitial lung disease/fibrosis. No evidence of superimposed pneumonia or pulmonary edema. 3. Aortic atherosclerosis. 4. Suspect continued postoperative ileus with mildly distended gas-filled loops of small bowel partially imaged within the upper abdomen. Electronically Signed   By: Franki Cabot M.D.   On: 11/18/2021 12:32   DG Chest 2 View  Result Date: 11/09/2021 CLINICAL DATA:  Shortness of breath.  Mild chest pain.  Smoker. EXAM: CHEST - 2 VIEW COMPARISON:  11/05/2021 FINDINGS: Right PICC tip in the region of the inferior aspect of the superior vena cava without significant change. The nasogastric tube has been removed. Normal sized heart. Stable linear scarring in the right upper lobe. No significant change in a small right pleural effusion and right basilar airspace opacity. Clear left lung. Mildly tortuous and calcified thoracic aorta. No acute bony abnormality. IMPRESSION: Stable small right pleural effusion and right basilar atelectasis and/or pneumonia. Electronically Signed   By: Remo Lipps  Joneen Caraway M.D.   On: 11/09/2021 10:17   DG Abd 1 View  Result Date: 11/03/2021 CLINICAL DATA:  NG tube placement EXAM: ABDOMEN - 1 VIEW COMPARISON:  11/03/2021 11:05 a.m. FINDINGS: Advancement of NG tube with tip and side port below the GE junction. Redemonstrated small bowel dilatation, not significantly changed from the prior exam. The stomach is not distended. The lower abdomen is not included for evaluation. IMPRESSION: Interval advancement of nasogastric tube, with tip and side port overlying the stomach. Overall unchanged small bowel dilatation in the imaged portion of the abdomen. Electronically Signed   By: Merilyn Baba M.D.   On: 11/03/2021 16:01   DG Abd 1 View  Result Date: 10/28/2021 CLINICAL DATA:  NG tube placement EXAM: ABDOMEN - 1 VIEW COMPARISON:  10/26/2021 FINDINGS: Limited radiograph of the lower chest and upper abdomen was obtained for the purposes of enteric  tube localization. Enteric tube is seen coursing below the diaphragm with distal tipterminating within the proximal stomach. Side port terminates within the distal esophagus. Similar degree of gaseous distension of large and small bowel within the visualized abdomen. IMPRESSION: Enteric tube with distal tip terminating within the proximal stomach, side port terminating within the distal esophagus. Recommend advancement 7-10 cm. Electronically Signed   By: Davina Poke D.O.   On: 10/28/2021 15:48   DG Abd 1 View  Result Date: 10/26/2021 CLINICAL DATA:  Hypotension history of ileostomy take down EXAM: ABDOMEN - 1 VIEW COMPARISON:  11/05/2020 FINDINGS: Clips in the pelvis. Moderate air distension of the stomach. Air-filled dilated small bowel up to 3.5 cm in the central abdomen. Extensive mottled appearance of the abdominal soft tissues presumably due to soft tissue emphysema related to recent laparoscopic procedure. IMPRESSION: 1. Air distension of stomach and small bowel either due to ileus or possible obstruction. 2. Diffuse mottled appearance of soft tissues felt consistent with soft tissue emphysema likely due to recent laparoscopic procedure Electronically Signed   By: Donavan Foil M.D.   On: 10/26/2021 17:51   US RENAL  Result Date: 10/25/2021 CLINICAL DATA:  Acute kidney injury EXAM: RENAL / URINARY TRACT ULTRASOUND COMPLETE COMPARISON:  CT 11/05/2020 FINDINGS: Right Kidney: Renal measurements: 8.9 x 4.1 x 4.2 cm = volume: 79.8 mL. Cortex slightly echogenic. No mass or hydronephrosis Left Kidney: Renal measurements: 7.5 x 4.4 x 4 cm = volume: 88.5 mL. Cortex slightly echogenic. No mass or hydronephrosis. Bladder: Appears normal for degree of bladder distention. Other: None. IMPRESSION: Slight increased cortical echogenicity suggesting medical renal disease. No hydronephrosis. Electronically Signed   By: Donavan Foil M.D.   On: 10/25/2021 16:28   DG Chest Port 1 View  Result Date:  11/16/2021 CLINICAL DATA:  Shortness of breath EXAM: PORTABLE CHEST 1 VIEW COMPARISON:  Previous studies including the examination of 11/14/2021 FINDINGS: Transverse diameter of heart is increased. Central pulmonary vessels are prominent. Linear densities in the right parahilar region have not changed significantly. Small bilateral pleural effusions are seen, more so on the right side. Crowding of markings in the medial lower lung fields suggest atelectasis/pneumonia. There is no pneumothorax. Tip of PICC line is seen in superior vena cava close to right atrium. IMPRESSION: Cardiomegaly. Central pulmonary vessels are prominent. Prominence of interstitial markings in the right parahilar region and both lower lung fields may suggest interstitial edema or pneumonia. Small bilateral pleural effusions, more so on the right side. Electronically Signed   By: Elmer Picker M.D.   On: 11/16/2021 08:13   DG Chest [facility]  1 View  Result Date: 11/14/2021 CLINICAL DATA:  Atelectasis, history of small-bowel obstruction EXAM: PORTABLE CHEST 1 VIEW COMPARISON:  11/13/2021 FINDINGS: No significant change in AP portable chest radiograph, with cardiomegaly and right upper extremity PICC. Diffuse bilateral interstitial pulmonary opacity, small bilateral pleural effusions, and bandlike scarring or atelectasis of the right midlung, unchanged. IMPRESSION: No significant change in AP portable chest radiograph, with cardiomegaly, probable edema, and bilateral pleural effusions. No new airspace opacity. Electronically Signed   By: Delanna Ahmadi M.D.   On: 11/14/2021 09:58   DG Chest Port 1 View  Result Date: 11/13/2021 CLINICAL DATA:  Post right thoracentesis EXAM: PORTABLE CHEST 1 VIEW COMPARISON:  Chest x-ray dated November 12, 2021 FINDINGS: Cardiac and mediastinal contours are unchanged. Linear nodular opacity of the right upper lobe, unchanged from prior exam. No new parenchymal opacity. Interval decreased size of small right  pleural effusion. No evidence pneumothorax. IMPRESSION: No evidence of pneumothorax. Electronically Signed   By: Yetta Glassman M.D.   On: 11/13/2021 11:46   DG Chest Port 1 View  Result Date: 11/12/2021 CLINICAL DATA:  Acute respiratory failure with hypoxia. EXAM: PORTABLE CHEST 1 VIEW COMPARISON:  November 09, 2021 FINDINGS: Right upper extremity PICC with tip near the superior cavoatrial junction. The heart size and mediastinal contours are within normal limits. Stable linear scarring in the right upper lobe. Slightly increased small right pleural effusion with adjacent airspace opacities. Increased conspicuity of the irregular nodular areas of consolidation in the right mid lung. The visualized skeletal structures are unremarkable. IMPRESSION: 1. Slightly increased small right pleural effusion with adjacent airspace opacities. 2. Increased conspicuity of the irregular nodular areas of consolidation in the right mid lung. Electronically Signed   By: Dahlia Bailiff M.D.   On: 11/12/2021 08:55   DG Chest Port 1 View  Result Date: 11/05/2021 CLINICAL DATA:  Fever. EXAM: PORTABLE CHEST 1 VIEW COMPARISON:  Chest radiograph 10/26/2021. FINDINGS: Right upper extremity PICC line tip projects over the superior vena cava. Enteric tube is present. The side port is at the level of the distal esophagus. Stable enlarged cardiac and mediastinal contours. Similar-appearing distortion and consolidation within the mid right lung. Bibasilar atelectasis, right-greater-than-left. Probable small right pleural effusion. No pneumothorax. IMPRESSION: Enteric tube side-port at the distal esophagus, recommend advancement. Right upper extremity PICC line tip projects over the superior vena cava. Similar-appearing irregular and nodular areas of consolidation within the right mid lung. These results will be called to the ordering clinician or representative by the Radiologist Assistant, and communication documented in the PACS or  [employer]. Electronically Signed   By: Lovey Newcomer M.D.   On: 11/05/2021 08:04   DG Abd 2 Views  Result Date: 11/06/2021 CLINICAL DATA:  Abdominal pain EXAM: ABDOMEN - 2 VIEW COMPARISON:  Abdominal x-ray 11/03/2021 FINDINGS: Enteric tube identified with the tip in the lower esophagus. Mildly distended loops of small bowel are again seen throughout the abdomen measuring up to 3.3 cm in diameter. No free air identified on the decubitus view. No suspicious calcifications identified. IMPRESSION: 1. Enteric tube tip is in the lower esophagus, advancement recommended. 2. Mildly distended loops of bowel throughout the abdomen which could be seen with ileus or obstruction. Continued follow-up recommended. Electronically Signed   By: Ofilia Neas M.D.   On: 11/06/2021 10:42   DG Abd 2 Views  Result Date: 11/03/2021 CLINICAL DATA:  Abdominal distention EXAM: ABDOMEN - 2 VIEW COMPARISON:  Previous studies including the examination of 11/02/2021 FINDINGS:  There is interval worsening of small bowel dilation measuring up to 4.9 cm in diameter. Tip enteric tube is noted at the gastroesophageal junction pointing cephalad. There is no distention of stomach. There is interval passage of contrast from the colon. There is evidence of previous tubal ligation. IMPRESSION: There is interval increase in small bowel dilation suggesting high-grade small bowel obstruction. Tip of enteric tube is at the gastroesophageal junction pointing cephalad. NG tube should be advanced 5-10 cm to place the tip and side port within the stomach. Electronically Signed   By: Elmer Picker M.D.   On: 11/03/2021 12:36   DG Abd 2 Views  Result Date: 10/31/2021 CLINICAL DATA:  Ileus following gastrointestinal surgery, colostomy takedown 10/23/2021 EXAM: ABDOMEN - 2 VIEW COMPARISON:  10/28/2021 FINDINGS: Nasogastric tube in proximal stomach. Persistent dilatation of small bowel loops in the abdomen without wall thickening. Small  amount retained contrast in colon. Levoconvex lumbar scoliosis. Foci of soft tissue gas at RIGHT lateral abdominal wall consistent with preceding surgery. Atelectasis at RIGHT lung base. IMPRESSION: Persistent small bowel ileus. Electronically Signed   By: Lavonia Dana M.D.   On: 10/31/2021 10:59   DG Abd Portable 1V-Small Bowel Obstruction Protocol-initial, 8 hr delay  Result Date: 11/02/2021 CLINICAL DATA:  8 hour follow-up small-bowel delay EXAM: PORTABLE ABDOMEN - 1 VIEW COMPARISON:  Film from the previous day. FINDINGS: Gastric catheter is noted within the stomach. Contrast material administered now lies within the dilated small bowel as well as within the colon. These changes are consistent with a partial small bowel obstruction. No free air is noted. IMPRESSION: Passage of contrast into the colon consistent with a partial small bowel obstruction. Electronically Signed   By: Inez Catalina M.D.   On: 11/02/2021 03:13   DG Abd Portable 1V  Result Date: 11/01/2021 CLINICAL DATA:  Enteric catheter placement EXAM: PORTABLE ABDOMEN - 1 VIEW COMPARISON:  10/31/2021 FINDINGS: Frontal view of the lower chest and upper abdomen demonstrates enteric catheter tip and side port projecting over the gastric fundus. Right-sided PICC tip overlies atriocaval junction. Cardiac silhouette is enlarged. Small right pleural effusion. Stable vascular congestion. Stable distended gas-filled loops of small bowel, likely ileus given recent surgery. IMPRESSION: 1. Enteric catheter projecting over gastric fundus. 2. Stable small bowel dilatation consistent with ileus. Electronically Signed   By: Randa Ngo M.D.   On: 11/01/2021 16:02   ECHOCARDIOGRAM COMPLETE  Result Date: 10/27/2021    ECHOCARDIOGRAM REPORT   Patient Name:   Danielle [name] [facility] Date of Exam: 10/27/2021 Medical Rec #:  [id number]       Height:       66.0 in Accession #:    [id number]      Weight:       124.0 lb Date of Birth:  Mar 27, 1956       BSA:          41.632 m  Patient Age:    78 years        BP:           144/84 mmHg Patient Gender: F               HR:           95 bpm. Exam Location:  [facility] Procedure: 2D Echo, Cardiac Doppler and Color Doppler Indications:     Elevated troponin  History:         Patient has prior history of Echocardiogram examinations, most  recent 11/07/2019. COPD, Arrythmias:Atrial Fibrillation,                  Signs/Symptoms:Murmur; Risk Factors:Hypertension.  Sonographer:     Sherrie Sport Referring Phys:  [place] Diagnosing Phys: Donnelly Angelica IMPRESSIONS  1. Left ventricular ejection fraction, by estimation, is 60 to 65%. The left ventricle has normal function. The left ventricle has no regional wall motion abnormalities. Left ventricular diastolic parameters are consistent with Grade I diastolic dysfunction (impaired relaxation).  2. Right ventricular systolic function is normal. The right ventricular size is mildly enlarged.  3. Right atrial size was mildly dilated.  4. The mitral valve is normal in structure. Moderate mitral valve regurgitation. No evidence of mitral stenosis.  5. Tricuspid valve regurgitation is moderate.  6. The aortic valve is calcified. Aortic valve regurgitation is not visualized. Mild aortic valve stenosis.  7. The inferior vena cava is normal in size with greater than 50% respiratory variability, suggesting right atrial pressure of 3 mmHg. Conclusion(s)/Recommendation(s): CALCIFIED AORTIC VALVE WITH MILD AS. VMax 2.1 m/s. Dimensionless index 0.4. FINDINGS  Left Ventricle: Left ventricular ejection fraction, by estimation, is 60 to 65%. The left ventricle has normal function. The left ventricle has no regional wall motion abnormalities. The left ventricular internal cavity size was normal in size. There is  no left ventricular hypertrophy. Left ventricular diastolic parameters are consistent with Grade I diastolic dysfunction (impaired relaxation). Right Ventricle: The right ventricular size is  mildly enlarged. No increase in right ventricular wall thickness. Right ventricular systolic function is normal. Left Atrium: Left atrial size was normal in size. Right Atrium: Right atrial size was mildly dilated. Pericardium: There is no evidence of pericardial effusion. Mitral Valve: The mitral valve is normal in structure. Moderate mitral valve regurgitation. No evidence of mitral valve stenosis. MV peak gradient, 3.1 mmHg. The mean mitral valve gradient is 1.0 mmHg. Tricuspid Valve: The tricuspid valve is normal in structure. Tricuspid valve regurgitation is moderate . No evidence of tricuspid stenosis. Aortic Valve: The aortic valve is calcified. Aortic valve regurgitation is not visualized. Mild aortic stenosis is present. Aortic valve mean gradient measures 9.4 mmHg. Aortic valve peak gradient measures 16.8 mmHg. Aortic valve area, by VTI measures 1.21 cm. Pulmonic Valve: The pulmonic valve was not well visualized. Pulmonic valve regurgitation is not visualized. No evidence of pulmonic stenosis. Aorta: The aortic root is normal in size and structure. Venous: The inferior vena cava is normal in size with greater than 50% respiratory variability, suggesting right atrial pressure of 3 mmHg. IAS/Shunts: No atrial level shunt detected by color flow Doppler.  LEFT VENTRICLE PLAX 2D LVIDd:         4.80 cm   Diastology LVIDs:         3.00 cm   LV e' medial:    5.87 cm/s LV PW:         1.10 cm   LV E/e' medial:  13.9 LV IVS:        0.75 cm   LV e' lateral:   12.40 cm/s LVOT diam:     2.00 cm   LV E/e' lateral: 6.6 LV SV:         45 LV SV Index:   28 LVOT Area:     3.14 cm  RIGHT VENTRICLE RV Basal diam:  3.80 cm RV S prime:     12.20 cm/s TAPSE (M-mode): 1.8 cm LEFT ATRIUM  Index        RIGHT ATRIUM           Index LA diam:        3.20 cm 1.96 cm/m   RA Area:     19.30 cm LA Vol (A2C):   58.8 ml 36.03 ml/m  RA Volume:   63.90 ml  39.15 ml/m LA Vol (A4C):   38.7 ml 23.71 ml/m LA Biplane Vol: 49.1 ml  30.08 ml/m  AORTIC VALVE                     PULMONIC VALVE AV Area (Vmax):    1.04 cm      PV Vmax:        0.59 m/s AV Area (Vmean):   0.91 cm      PV Vmean:       35.200 cm/s AV Area (VTI):     1.21 cm      PV VTI:         0.109 m AV Vmax:           205.20 cm/s   PV Peak grad:   1.4 mmHg AV Vmean:          140.200 cm/s  PV Mean grad:   1.0 mmHg AV VTI:            0.373 m       RVOT Peak grad: 4 mmHg AV Peak Grad:      16.8 mmHg AV Mean Grad:      9.4 mmHg LVOT Vmax:         67.90 cm/s LVOT Vmean:        40.500 cm/s LVOT VTI:          0.144 m LVOT/AV VTI ratio: 0.39  AORTA Ao Root diam: 3.17 cm MITRAL VALVE               TRICUSPID VALVE MV Area (PHT): 4.86 cm    TR Peak grad:   52.4 mmHg MV Area VTI:   3.01 cm    TR Vmax:        362.00 cm/s MV Peak grad:  3.1 mmHg MV Mean grad:  1.0 mmHg    SHUNTS MV Vmax:       0.88 m/s    Systemic VTI:  0.14 m MV Vmean:      55.0 cm/s   Systemic Diam: 2.00 cm MV Decel Time: 156 msec    Pulmonic VTI:  0.157 m MV E velocity: 81.40 cm/s MV A velocity: 51.40 cm/s MV E/A ratio:  1.58 Donnelly Angelica Electronically signed by Donnelly Angelica Signature Date/Time: 10/27/2021/4:07:10 PM    Final    [place] EKG SITE RITE  Result Date: 11/01/2021 If Site Rite image not attached, placement could not be confirmed due to current cardiac rhythm.  US THORACENTESIS ASP PLEURAL SPACE W/IMG GUIDE  Result Date: 11/13/2021 INDICATION: Right pleural effusion with shortness breath request received for diagnostic and therapeutic thoracentesis. EXAM: ULTRASOUND GUIDED RIGHT THORACENTESIS MEDICATIONS: Local 1% lidocaine only. COMPLICATIONS: None immediate. PROCEDURE: An ultrasound guided thoracentesis was thoroughly discussed with the patient and questions answered. The benefits, risks, alternatives and complications were also discussed. The patient understands and wishes to proceed with the procedure. Written consent was obtained. Ultrasound was performed to localize and mark an adequate pocket of fluid in  the right chest. The area was then prepped and draped in the normal sterile fashion. 1% Lidocaine was used for local anesthesia. Under  ultrasound guidance a 19 gauge, 7-cm, Yueh catheter was introduced. Thoracentesis was performed. The catheter was removed and a dressing applied. FINDINGS: A total of approximately 1 L of amber colored fluid was removed. Samples were sent to the laboratory as requested by the clinical team. IMPRESSION: Successful ultrasound guided right thoracentesis yielding 1 L of pleural fluid. This exam was performed by Tsosie Billing PA-C, and was supervised and interpreted by Dr. Denna Haggard. Electronically Signed   By: Albin Felling M.D.   On: 11/13/2021 12:05       ASSESSMENT/PLAN   Acute on chronic hypoxemic respiratory failure   - Due to moderate right pleural effusion with associated right lower lobe complete atelectasis   -s/p thoracentesis - 1 L - aspirated -                 -patient would benefit from diuresis - nephrology on case appreciate collaboration  Moderate pleural effusion   S/p aspiration - fluid pattern studies in process     Suspect transudative fluid due to CKD - Noted nephrology on case - appreciate input   Right lower lobe atelectasis   - patient would benefit from recruitment maneuvers    - will order RT Metaneb with albuterol BID    - Incentive spirometry and flutter valve PRN  CKD   - likely culprit with fluid overload contributing to pulmonary and gut edema   Atrial fibrillation - chronic - potential for rapid ventricular response due to albuterol/ipratropium therapy     Thank you for allowing me to participate in the care of this patient.    Patient/Family are satisfied with care plan and all questions have been answered.  This document was prepared using Dragon voice recognition software and may include unintentional dictation errors.     Ottie Glazier, M.D.  Division of Pulmonary & [place]          .fa

## 2021-11-20 NOTE — Progress Notes (Signed)
VS retaken within an hour. No escalation needed.

## 2021-11-20 NOTE — Evaluation (Signed)
Clinical/Bedside Swallow Evaluation Patient Details  Name: Danielle [name] MRN: [id number] Date of Birth: 10-30-1955  Today's Date: 11/20/2021 Time: SLP Start Time (ACUTE ONLY): 12 SLP Stop Time (ACUTE ONLY): 0935 SLP Time Calculation (min) (ACUTE ONLY): 15 min  Past Medical History:  Past Medical History:  Diagnosis Date   Anemia    Anxiety    Aortic atherosclerosis ([place])    Aortic valve stenosis 02/10/2018   a.) TTE 02/10/2018: EF 55-60%: mild AS with MPG of 12 mmHg. b.) TTE 04/21/2018: EF 35-40%; mild AS with MPG 8 mmHg. c.) TTE 11/07/2019: EF 50-55%; mild AS with MPG 10 mmHg.   Arthritis    Atrial fibrillation (HCC)    a.) CHA2DS2-VASc = 4 (age, sex, HTN, aortic plaque). b.) Rate/rhythm maintained on oral carvedilol; chronically anticoagulated on dose reduced apixaban. c.)  Attempted deployment of LAA occlusive device on 01/11/2021; parameters failed and procedure aborted.   Breast cancer, left ([place]) 2000   a.) T2N1M0; ER/PR (+) --> Tx'd with total mastectomy, LN resection, XRT, and chemotherapy   Cancer of right lung ([place]) 07/30/2016   a.) adenocarcinoma; ALK, ROS1, PDL1, BRAF, EGFR all negative.   CKD (chronic kidney disease), stage IV (HCC)    COPD (chronic obstructive pulmonary disease) (HCC)    Dependence on supplemental oxygen    Depression    Diastolic dysfunction 40/98/1191   a.) TTE 02/10/2017: EF 55-60%; G2DD. b.) TTE 04/21/2018: EF 35-40%; mild LA dilation; mod MV regurgitation. c.) TTE 11/07/2019: EF 50-55%; G1DD.   DOE (dyspnea on exertion)    GERD (gastroesophageal reflux disease)    Heart murmur    History of 2019 novel coronavirus disease (COVID-19) 10/14/2019   HLD (hyperlipidemia)    Hypertension    Long term current use of anticoagulant    a.) apixaban   Lymphedema    Personal history of chemotherapy    Personal history of radiation therapy    SBO (small bowel obstruction) ([place]) 11/05/2020   Vitamin D deficiency    Past Surgical History:  Past  Surgical History:  Procedure Laterality Date   Breast Biospy Left    [facility]   BREAST SURGERY     COLONOSCOPY N/A 04/30/2018   Procedure: COLONOSCOPY;  Surgeon: Virgel Manifold, MD;  Location: [facility] ENDOSCOPY;  Service: Endoscopy;  Laterality: N/A;   COLONOSCOPY N/A 07/22/2018   Procedure: COLONOSCOPY;  Surgeon: Virgel Manifold, MD;  Location: [facility] ENDOSCOPY;  Service: Endoscopy;  Laterality: N/A;   COLONOSCOPY WITH PROPOFOL N/A 09/21/2021   Procedure: COLONOSCOPY WITH PROPOFOL;  Surgeon: Benjamine Sprague, DO;  Location: [facility] ENDOSCOPY;  Service: General;  Laterality: N/A;   DILATION AND CURETTAGE OF UTERUS     ELECTROMAGNETIC NAVIGATION BROCHOSCOPY Right 04/11/2016   Procedure: ELECTROMAGNETIC NAVIGATION BRONCHOSCOPY;  Surgeon: Vilinda Boehringer, MD;  Location: [facility] ORS;  Service: Cardiopulmonary;  Laterality: Right;   ESOPHAGOGASTRODUODENOSCOPY N/A 07/22/2018   Procedure: ESOPHAGOGASTRODUODENOSCOPY (EGD);  Surgeon: Virgel Manifold, MD;  Location[facility] ENDOSCOPY;  Service: Endoscopy;  Laterality: N/A;   ESOPHAGOGASTRODUODENOSCOPY (EGD) WITH PROPOFOL N/A 05/07/2018   Procedure: ESOPHAGOGASTRODUODENOSCOPY (EGD) WITH PROPOFOL;  Surgeon: Lucilla Lame, MD;  Location: [facility] ENDOSCOPY;  Service: Endoscopy;  Laterality: N/A;   ESOPHAGOGASTRODUODENOSCOPY (EGD) WITH PROPOFOL N/A 04/24/2019   Procedure: ESOPHAGOGASTRODUODENOSCOPY (EGD) WITH PROPOFOL;  Surgeon: Jonathon Bellows, MD;  Location: [facility] ENDOSCOPY;  Service: Gastroenterology;  Laterality: N/A;   ESOPHAGOGASTRODUODENOSCOPY (EGD) WITH PROPOFOL N/A 01/12/2020   Procedure: ESOPHAGOGASTRODUODENOSCOPY (EGD) WITH PROPOFOL;  Surgeon: Jonathon Bellows, MD;  Location: [facility] ENDOSCOPY;  Service: Gastroenterology;  Laterality: N/A;   ESOPHAGOGASTRODUODENOSCOPY (EGD) WITH PROPOFOL N/A 04/28/2020   Procedure: ESOPHAGOGASTRODUODENOSCOPY (EGD) WITH PROPOFOL;  Surgeon: Jonathon Bellows, MD;  Location: [facility] ENDOSCOPY;  Service: Gastroenterology;  Laterality: N/A;   EUS N/A  05/07/2019   Procedure: FULL UPPER ENDOSCOPIC ULTRASOUND (EUS) RADIAL;  Surgeon: Jola Schmidt, MD;  Location: [facility] ENDOSCOPY;  Service: Endoscopy;  Laterality: N/A;   ILEOSCOPY N/A 07/22/2018   Procedure: ILEOSCOPY THROUGH STOMA;  Surgeon: Virgel Manifold, MD;  Location: [facility] ENDOSCOPY;  Service: Endoscopy;  Laterality: N/A;   ILEOSTOMY     ILEOSTOMY N/A 09/08/2018   Procedure: ILEOSTOMY REVISION POSSIBLE CREATION;  Surgeon: Herbert Pun, MD;  Location: [facility] ORS;  Service: General;  Laterality: N/A;   ILEOSTOMY CLOSURE N/A 08/15/2018   Procedure: DILATION OF ILEOSTOMY STRICTURE;  Surgeon: Herbert Pun, MD;  Location: [facility] ORS;  Service: General;  Laterality: N/A;   LAPAROTOMY Right 05/04/2018   Procedure: EXPLORATORY LAPAROTOMY right colectomy right and left ostomy;  Surgeon: Herbert Pun, MD;  Location: [facility] ORS;  Service: General;  Laterality: Right;   LEFT ATRIAL APPENDAGE OCCLUSION N/A 01/11/2021   Procedure: LEFT ATRIAL APPENDAGE OCCLUSION ([place]); ABORTED PROCEDURE WITHOUT DEVICE BEING IMPLANTED; Location: [facility]; Surgeon: Mylinda Latina, MD   LUNG BIOPSY     MASTECTOMY Left    2000, [place]   ROTATOR CUFF REPAIR Right    [place] TAKEDOWN N/A 10/23/2021   Procedure: XI ROBOTIC ASSISTED ILEOSTOMY TAKEDOWN;  Surgeon: Herbert Pun, MD;  Location: [facility] ORS;  Service: General;  Laterality: N/A;  180 minutes for the surgery part please   HPI:  Per Physician's H&P "Ms. Dottavio is a 66 y.o.female patient who comes for reevaluation of her ileostomy status.    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 She had issues with ileostomy stricture and it was able to be revised locally. At that point she was very weak and not stable medically. For the last few year she has been getting stable with adequate blood pressure, hemoglobin and heart rate control. She is also very mobile and strong again. She  has been able to tolerate diet.    For the last few months she has been complaining of increased mucus through the mucous fistula. She is also having adequate output through the ileostomy.    She was evaluated by cardiology 2 weeks ago. The assessment was that she was stable and was a very low risk for surgical management. Patient denies any chest pain or shortness of breath. She is able to do 4 METS without chest pain or shortness of breath." Pt s/p ileostomy take down on 10/23/21.    Assessment / Plan / Recommendation  Clinical Impression  Pt seen for clinical swallowing evaluation. Pt alert, pleasant, and cooperative. Consuming breakfast upon SLP entrance to room. Pt denies dysphagia. On 2L/min O2 via [place].   CXR 11/18/21 "1. Small bilateral pleural effusions, RIGHT greater than LEFT.2. Coarse lung markings throughout both lungs, compatible with chronic interstitial lung disease/fibrosis. No evidence of superimposed pneumonia or pulmonary edema. 3. Aortic atherosclerosis. 4. Suspect continued postoperative ileus with mildly distended gas-filled loops of small bowel partially imaged within the upper abdomen." Temp WNL. WBC WNL.   Pt observed with consistencies from soft meal tray. Pt demonstrated an intact oral swallow despite sparse dentition. Pt owns upper dentures, not present for evaluation. Pharyngeal swallow appeared [facility] per clincial assessment. No overt or subtle s/sx pharyngeal dysphagia. To palpation, seemingly timely swallow initiation  and seemingly adequate laryngeal elevation. No change to vocal quality across trials.   Recommend continuation of current diet with standard aspiration precautions. SLP to sign off as pt has no acute SLP needs at this time.  SLP Visit Diagnosis: Dysphagia, unspecified (R13.10)    Aspiration Risk  Mild aspiration risk    Diet Recommendation  (continue current diet)   Medication Administration:  (as tolerated) Supervision: Patient able to self feed Postural  Changes: Seated upright at 90 degrees;Remain upright for at least 30 minutes after po intake    Other  Recommendations Oral Care Recommendations: Oral care BID;Oral care before and after PO;Patient independent with oral care    Recommendations for follow up therapy are one component of a multi-disciplinary discharge planning process, led by the attending physician.  Recommendations may be updated based on patient status, additional functional criteria and insurance authorization.  Follow up Recommendations No SLP follow up      Assistance Recommended at Discharge  (defer to OT/PT)  Functional Status Assessment Patient has not had a recent decline in their functional status         Prognosis Prognosis for Safe Diet Advancement: Good      Swallow Study   General Date of Onset: 10/23/21 (admission date) HPI: Per Physician's H&P "Ms. Burlison is a 66 y.o.female patient who comes for reevaluation of her ileostomy status.    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 She had issues with ileostomy stricture and it was able to be revised locally. At that point she was very weak and not stable medically. For the last few year she has been getting stable with adequate blood pressure, hemoglobin and heart rate control. She is also very mobile and strong again. She has been able to tolerate diet.    For the last few months she has been complaining of increased mucus through the mucous fistula. She is also having adequate output through the ileostomy.    She was evaluated by cardiology 2 weeks ago. The assessment was that she was stable and was a very low risk for surgical management. Patient denies any chest pain or shortness of breath. She is able to do 4 METS without chest pain or shortness of breath." Pt s/p ileostomy take down on 10/23/21. Type of Study: Bedside Swallow Evaluation Previous Swallow Assessment: unknown; denies dysphagia Diet Prior to this Study:  (soft,  thin) Temperature Spikes Noted: No Respiratory Status: Nasal cannula (2L/min) History of Recent Intubation: Yes Length of Intubations (days): 0 days (for procedure on 10/23/21) Behavior/Cognition: Alert;Cooperative;Pleasant mood Oral Cavity Assessment: Within Functional Limits Oral Care Completed by SLP: Yes Oral Cavity - Dentition: Missing dentition;Dentures, not available Vision: Functional for self-feeding Self-Feeding Abilities: Able to feed self Patient Positioning: Upright in bed Baseline Vocal Quality: Normal Volitional Cough: Strong Volitional Swallow: Able to elicit    Oral/Motor/Sensory Function Overall Oral Motor/Sensory Function: Within functional limits   Thin Liquid Thin Liquid: Within functional limits Presentation: Cup;Straw;Self Fed Other Comments: ~4 oz from meal tray    Solid     Solid: Within functional limits Presentation: Spoon Other Comments: eggs and toast from meal tray     Cherrie Gauze, M.S., [facility] [phone] ([place])   Clearnce Sorrel Danyah Guastella 11/20/2021,11:50 AM

## 2021-11-20 NOTE — TOC Progression Note (Signed)
Transition of Care [facility]) - Progression Note    Patient Details  Name: Danielle [name] MRN: [id number] Date of Birth: [birth year]  [facility]) CM/SW Contact  Beverly Sessions, RN Phone Number: 11/20/2021, 4:39 PM  Clinical Narrative:     Confirmed BSC was delivered to room  Expected Discharge Plan: Home/Self Care Barriers to Discharge: Continued Medical Work up  Expected Discharge Plan and Services Expected Discharge Plan: Home/Self Care                                               Social Determinants of Health (SDOH) Interventions    Readmission Risk Interventions Readmission Risk Prevention Plan 10/16/2019 09/28/2019 06/20/2019  Transportation Screening Complete Complete Complete  Medication Review (RN Care Manager) Referral to Pharmacy Complete Complete  [place] or Home Care Consult Complete Patient refused Complete  SW Recovery Care/Counseling Consult Complete Complete -  Palliative Care Screening Not Applicable Not Applicable Not Divide Not Applicable Not Applicable -  Some recent data might be hidden

## 2021-11-20 NOTE — Plan of Care (Signed)
  Problem: Health Behavior/Discharge Planning: Goal: Ability to manage health-related needs will improve Outcome: Progressing   

## 2021-11-21 ENCOUNTER — Inpatient Hospital Stay: Payer: Medicare Other

## 2021-11-21 LAB — CBC
HCT: 25.9 % — ABNORMAL LOW (ref 36.0–46.0)
Hemoglobin: 8.1 g/dL — ABNORMAL LOW (ref 12.0–15.0)
MCH: 29.7 pg (ref 26.0–34.0)
MCHC: 31.3 g/dL (ref 30.0–36.0)
MCV: 94.9 fL (ref 80.0–100.0)
Platelets: 335 10*3/uL (ref 150–400)
RBC: 2.73 MIL/uL — ABNORMAL LOW (ref 3.87–5.11)
RDW: 15.6 % — ABNORMAL HIGH (ref 11.5–15.5)
WBC: 6 10*3/uL (ref 4.0–10.5)
nRBC: 0 % (ref 0.0–0.2)

## 2021-11-21 MED ORDER — FUROSEMIDE 40 MG PO TABS: 40.0000 mg | ORAL_TABLET | Freq: Two times a day (BID) | ORAL | Status: DC

## 2021-11-21 MED ORDER — TORSEMIDE 100 MG PO TABS
100.0000 mg | ORAL_TABLET | Freq: Two times a day (BID) | ORAL | Status: DC
  Administered 2021-11-21 – 2021-11-23 (×5): 100 mg via ORAL
  Filled 2021-11-21 (×5): qty 1

## 2021-11-21 MED ORDER — MIDODRINE HCL 5 MG PO TABS
10.0000 mg | ORAL_TABLET | Freq: Three times a day (TID) | ORAL | Status: DC
  Administered 2021-11-21 – 2021-11-24 (×7): 10 mg via ORAL
  Filled 2021-11-21 (×8): qty 2

## 2021-11-21 NOTE — Progress Notes (Signed)
Mobility Specialist - Progress Note   11/21/21 1100  Mobility  Activity Refused mobility    Pt declined mobility, fatigued as pt just finished ambulating hallway with PT. Will attempt session another date/time.    Kathee Delton Mobility Specialist 11/21/21, 11:18 AM

## 2021-11-21 NOTE — Progress Notes (Signed)
Patient ID: Danielle [name], female   DOB: Jul 30, 1956, 66 y.o.   MRN: [id number]     [facility] Day(s): 29.   Interval History: Patient seen and examined, no acute events or new complaints overnight. Patient reports not feeling better. Still with shortness of breath.  Vital signs in last 24 hours: [min-max] current  Temp:  [97.8 F (36.6 C)-98.6 F (37 C)] 98.5 F (36.9 C) (02/07 1718) Pulse Rate:  [80-100] 87 (02/07 1718) Resp:  [16-18] 16 (02/07 1718) BP: (92-105)/(63-76) 105/71 (02/07 1718) SpO2:  [96 %-99 %] 96 % (02/07 1718)     Height: 5\' 6"  (167.6 cm) Weight: 60.5 kg BMI (Calculated): 21.54   Physical Exam:  Constitutional: alert, cooperative and no distress  Respiratory: breathing non-labored at rest on nasal canula Cardiovascular: irregular rythm Gastrointestinal: soft, non-tender, and non-distended  Labs:  CBC Latest Ref Rng & Units 11/21/2021 11/18/2021 11/17/2021  WBC 4.0 - 10.5 K/uL 6.0 8.8 7.2  Hemoglobin 12.0 - 15.0 g/dL 8.1(L) 7.9(L) 7.9(L)  Hematocrit 36.0 - 46.0 % 25.9(L) 24.6(L) 24.3(L)  Platelets 150 - 400 K/uL 335 337 317   CMP Latest Ref Rng & Units 11/19/2021 11/18/2021 11/17/2021  Glucose 70 - 99 mg/dL 85 96 79  BUN 8 - 23 mg/dL 23 23 22   Creatinine 0.44 - 1.00 mg/dL 2.15(H) 1.98(H) 1.87(H)  Sodium 135 - 145 mmol/L 134(L) 138 138  Potassium 3.5 - 5.1 mmol/L 4.6 5.4(H) 4.6  Chloride 98 - 111 mmol/L 97(L) 100 101  CO2 22 - 32 mmol/L 29 32 29  Calcium 8.9 - 10.3 mg/dL 8.9 8.8(L) 8.6(L)  Total Protein 6.5 - 8.1 g/dL - - -  Total Bilirubin 0.3 - 1.2 mg/dL - - -  Alkaline Phos 38 - 126 U/L - - -  AST 15 - 41 U/L - - -  ALT 0 - 44 U/L - - -    Imaging studies: No new pertinent imaging studies   Assessment/Plan:  66 y.o. female with history of an ileostomy and mucous fistula 28 Day Post-Op s/p ileostomy reversal, complicated by pertinent comorbidities including COPD, atrial fibrillation on anticoagulation, hypertension, chronic kidney disease  stage III.   Status post ileostomy reversal -Patient initially with prolonged postoperative ileus that resolved. -Tolerating soft diet well -Passing gas and having bowel movements    Chronic kidney disease stage III -Acute kidney injury after surgery.   --Nephrology managing with diuretics.   Shortness of breath -Continue with significant shortness of breath and tachycardia on exertion. -O2 sat drops to 80% on exertion at 4 L/min -Pulmonology increasing diuretics for management of pulmonary edema   A. Fib -Intermittent rate control -Currently on therapeutic anticoagulation -Cardiology signed off -Appreciate hospitalist recommendations.   Encourage patient to get out of bed.  Will continue pain management.   Arnold Long, MD

## 2021-11-21 NOTE — Progress Notes (Signed)
[facility]  PROGRESS NOTE   Subjective:   Patient sitting up in bed  Continues to complain of shortness of breath with activity Appetite improving  Creatinine stable 2.2 Urine output recorded of 558ml overnight  Objective:  Vital signs in last 24 hours:  Temp:  [97.6 F (36.4 C)-98.6 F (37 C)] 98.2 F (36.8 C) (02/07 1131) Pulse Rate:  [80-100] 91 (02/07 1131) Resp:  [16-18] 17 (02/07 1131) BP: (91-101)/(63-76) 101/73 (02/07 1131) SpO2:  [97 %-99 %] 98 % (02/07 1131)  Weight change:  Filed Weights   11/17/21 0449 11/18/21 0500 11/20/21 0605  Weight: 62.3 kg 62.4 kg 60.5 kg    Intake/Output: I/O last 3 completed shifts: In: 0102 [P.O.:1040] Out: 500 [Urine:500]   Intake/Output this shift:  No intake/output data recorded.  Physical Exam: General:  No acute distress  Head:  Normocephalic, atraumatic. Moist oral mucosal membranes  Eyes:  Anicteric  Lungs:   Clear to auscultation, normal effort  Heart:  S1S2 no rubs  Abdomen:   Soft, nontender, bowel sounds present  Extremities:  No peripheral edema.  Neurologic:  Awake, alert, following commands  Skin:  No lesions       Basic Metabolic Panel: Recent Labs  Lab 11/15/21 1027 11/16/21 0506 11/17/21 0511 11/18/21 0510 11/19/21 0905  NA 137 141 138 138 134*  K 3.2* 3.8 4.6 5.4* 4.6  CL 100 103 101 100 97*  CO2 30 31 29  32 29  GLUCOSE 120* 84 79 96 85  BUN 23 22 22 23 23   CREATININE 1.78* 1.75* 1.87* 1.98* 2.15*  CALCIUM 8.5* 8.5* 8.6* 8.8* 8.9  MG  --   --  1.3* 2.0  --   PHOS  --   --  3.8  --  4.6     CBC: Recent Labs  Lab 11/15/21 1027 11/16/21 0506 11/17/21 0511 11/18/21 0510 11/21/21 0600  WBC 7.9 6.8 7.2 8.8 6.0  HGB 8.0* 7.7* 7.9* 7.9* 8.1*  HCT 24.8* 24.3* 24.3* 24.6* 25.9*  MCV 94.7 95.7 94.9 94.6 94.9  PLT 321 361 317 337 335      Urinalysis: No results for input(s): COLORURINE, LABSPEC, PHURINE, GLUCOSEU, HGBUR, BILIRUBINUR, KETONESUR, PROTEINUR, UROBILINOGEN,  NITRITE, LEUKOCYTESUR in the last 72 hours.  Invalid input(s): APPERANCEUR    Imaging: No results found.   Medications:     amiodarone  200 mg Oral BID   apixaban  2.5 mg Oral BID   Chlorhexidine Gluconate Cloth  6 each Topical Daily   citalopram  40 mg Oral BH-q7a   diltiazem  120 mg Oral Daily   famotidine  20 mg Oral Daily   feeding supplement (NEPRO CARB STEADY)  237 mL Oral TID BM   fluticasone furoate-vilanterol  1 puff Inhalation Daily   And   umeclidinium bromide  1 puff Inhalation Daily   furosemide  40 mg Oral BID   gabapentin  300 mg Oral BID   midodrine  5 mg Oral TID WC   multivitamin with minerals  1 tablet Oral Daily   sodium chloride flush  10-40 mL Intracatheter Q12H   Vitamin D (Ergocalciferol)  50,000 Units Oral Q7 days    Assessment/ Plan:     Principal Problem:   Ileostomy status (HCC) Active Problems:   Hyponatremia   Acute respiratory failure with hypoxia (HCC)   Weakness   Acute kidney injury superimposed on CKD (HCC)   HTN (hypertension)   AF (paroxysmal atrial fibrillation) (HCC)   Normocytic anemia  Depression   Pleural effusion due to CHF (congestive heart failure) (HCC)   Hypokalemia   Acute on chronic combined systolic and diastolic CHF (congestive heart failure) (HCC)   Hyperphosphatemia   Hypomagnesemia   Anemia of chronic disease   Ileus following gastrointestinal surgery (HCC)   Aspiration pneumonia of right lower lobe due to gastric secretions [facility])  66 year old female with history of hypertension, coronary artery disease, atrial fibrillation, COPD, chronic kidney disease stage IIIb and history of ileostomy now s/p ileostomy reversal. Postoperative course complicated by ileus development and acute kidney injury on the top of chronic kidney disease.  She also developed anemia requiring 2 units of blood transfusion.  Patient is presently hemodynamically stable and is able to ambulate with assistance.   #1: Acute kidney injury on  CKD:  Baseline Creatnine 1.25 / GFR  on 10/30/2021. Creatinine remains stable. Will transition to oral Furosemide 40mg  twice daily.    #2: Anemia: Patient may have anemia secondary to chronic kidney disease.  Patient has received blood transfusions during this admission.  Hgb 8.1 today   #3:  Status post elective ileostomy reversal complicated by ileus resulting in short-term TPN and NG tube.  Tolerating meals and passing flatus.  Pain management and mobility.    #4: Hypertension/CHF: Continue present antihypertensive medications.  Currently receiving low-dose midodrine with furosemide and spironolactone. BP 101/73    LOS: [facility] 2/7/202311:37 AM

## 2021-11-21 NOTE — Progress Notes (Signed)
PULMONOLOGY         Date: 11/21/2021,   MRN# [id number] Danielle [name] October 08, 1956     AdmissionWeight: 56.2 kg                 CurrentWeight: 60.5 kg   Referring physician: Dr Peyton Najjar   CHIEF COMPLAINT:   Increased O2 requirement.   HISTORY OF PRESENT ILLNESS   This is a pleasant 66 yo with complex comorbid history including, COPD, AF, HTN, CKD3, s/p ileostomy reversal with persistent increase O2 req and notable pleural effusion. She is s/p thoracentesis yielding 1L fluid drained from right pleural space.  She is on supplemental O2 and feels improved.    11/14/21 - patient received VEST therapy yesterday with RT and felt that it may be helping. We have repeat CXR scheduled for today. She took few breaths on IS up to 652mL tidal volume. We discussed importance of PT/OT and she agrees to work with them for strength training. Yesterday she had big BM during my visit along with physical therapist. She remains on 3.5L/min and Ive discussed with RN to wean that down as able with goal spO2 >88%.  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     11/16/21- patient is further improved with O2 weaned to 2L/min during my evaluation today.  11/18/21- patient still hospitalized and has increased O2 requirement at 3L/min now.  She cannot walk without desaturation.  I think this is related to fluid balance and renal failure. CXR with bilateral effusions and pulmonary edema.  Renal function is worse this am.  Im gonna increase her blood pressure today and this should help with diuresis and we should continue PT to treat atelectatic segments.   11/19/21- patient is about same as yesterday, clinically however she does seem improved.    11/20/21- Patient seen and examined at bedside. She remains with hypoxemia. Plan has been reviewed with team and I started amio 200 bid PO today, reduced cardizem to  120 stopped metoprolol , started midodrine tid   11/21/21- patient has had no chage from yesterday, I have intensified her diuresis today and icreased midodrine to 10 tid  PAST MEDICAL HISTORY   Past Medical History:  Diagnosis Date   Anemia    Anxiety    Aortic atherosclerosis ([place])    Aortic valve stenosis 02/10/2018   a.) TTE 02/10/2018: EF 55-60%: mild AS with MPG of 12 mmHg. b.) TTE 04/21/2018: EF 35-40%; mild AS with MPG 8 mmHg. c.) TTE 11/07/2019: EF 50-55%; mild AS with MPG 10 mmHg.   Arthritis    Atrial fibrillation (HCC)    a.) CHA2DS2-VASc = 4 (age, sex, HTN, aortic plaque). b.) Rate/rhythm maintained on oral carvedilol; chronically anticoagulated on dose reduced apixaban. c.)  Attempted deployment of LAA occlusive device on 01/11/2021; parameters failed and procedure aborted.   Breast cancer, left ([place]) 2000   a.) T2N1M0; ER/PR (+) --> Tx'd with total mastectomy, LN resection, XRT, and chemotherapy   Cancer of right lung ([place]) 07/30/2016   a.) adenocarcinoma; ALK, ROS1, PDL1, BRAF, EGFR all negative.   CKD (chronic kidney disease), stage IV (HCC)    COPD (chronic obstructive pulmonary disease) (HCC)    Dependence on supplemental oxygen    Depression    Diastolic dysfunction 33/00/7622   a.) TTE 02/10/2017: EF 55-60%; G2DD. b.) TTE 04/21/2018: EF 35-40%; mild LA dilation; mod MV regurgitation. c.) TTE  11/07/2019: EF 50-55%; G1DD.   DOE (dyspnea on exertion)    GERD (gastroesophageal reflux disease)    Heart murmur    History of 2019 novel coronavirus disease (COVID-19) 10/14/2019   HLD (hyperlipidemia)    Hypertension    Long term current use of anticoagulant    a.) apixaban   Lymphedema    Personal history of chemotherapy    Personal history of radiation therapy    SBO (small bowel obstruction) ([place]) 11/05/2020   Vitamin D deficiency      SURGICAL HISTORY   Past Surgical History:  Procedure Laterality Date   Breast Biospy Left    [facility]   BREAST SURGERY      COLONOSCOPY N/A 04/30/2018   Procedure: COLONOSCOPY;  Surgeon: Virgel Manifold, MD;  Location: [facility] ENDOSCOPY;  Service: Endoscopy;  Laterality: N/A;   COLONOSCOPY N/A 07/22/2018   Procedure: COLONOSCOPY;  Surgeon: Virgel Manifold, MD;  Location: [facility] ENDOSCOPY;  Service: Endoscopy;  Laterality: N/A;   COLONOSCOPY WITH PROPOFOL N/A 09/21/2021   Procedure: COLONOSCOPY WITH PROPOFOL;  Surgeon: Benjamine Sprague, DO;  Location: [facility] ENDOSCOPY;  Service: General;  Laterality: N/A;   DILATION AND CURETTAGE OF UTERUS     ELECTROMAGNETIC NAVIGATION BROCHOSCOPY Right 04/11/2016   Procedure: ELECTROMAGNETIC NAVIGATION BRONCHOSCOPY;  Surgeon: Vilinda Boehringer, MD;  Location: [facility] ORS;  Service: Cardiopulmonary;  Laterality: Right;   ESOPHAGOGASTRODUODENOSCOPY N/A 07/22/2018   Procedure: ESOPHAGOGASTRODUODENOSCOPY (EGD);  Surgeon: Virgel Manifold, MD;  Location: [facility] ENDOSCOPY;  Service: Endoscopy;  Laterality: N/A;   ESOPHAGOGASTRODUODENOSCOPY (EGD) WITH PROPOFOL N/A 05/07/2018   Procedure: ESOPHAGOGASTRODUODENOSCOPY (EGD) WITH PROPOFOL;  Surgeon: Lucilla Lame, MD;  Location: [facility] ENDOSCOPY;  Service: Endoscopy;  Laterality: N/A;   ESOPHAGOGASTRODUODENOSCOPY (EGD) WITH PROPOFOL N/A 04/24/2019   Procedure: ESOPHAGOGASTRODUODENOSCOPY (EGD) WITH PROPOFOL;  Surgeon: Jonathon Bellows, MD;  Location: [facility] ENDOSCOPY;  Service: Gastroenterology;  Laterality: N/A;   ESOPHAGOGASTRODUODENOSCOPY (EGD) WITH PROPOFOL N/A 01/12/2020   Procedure: ESOPHAGOGASTRODUODENOSCOPY (EGD) WITH PROPOFOL;  Surgeon: Jonathon Bellows, MD;  Location: [facility] ENDOSCOPY;  Service: Gastroenterology;  Laterality: N/A;   ESOPHAGOGASTRODUODENOSCOPY (EGD) WITH PROPOFOL N/A 04/28/2020   Procedure: ESOPHAGOGASTRODUODENOSCOPY (EGD) WITH PROPOFOL;  Surgeon: Jonathon Bellows, MD;  Location: [facility] ENDOSCOPY;  Service: Gastroenterology;  Laterality: N/A;   EUS N/A 05/07/2019   Procedure: FULL UPPER ENDOSCOPIC ULTRASOUND (EUS) RADIAL;  Surgeon: Jola Schmidt, MD;   Location: [facility] ENDOSCOPY;  Service: Endoscopy;  Laterality: N/A;   ILEOSCOPY N/A 07/22/2018   Procedure: ILEOSCOPY THROUGH STOMA;  Surgeon: Virgel Manifold, MD;  Location: [facility] ENDOSCOPY;  Service: Endoscopy;  Laterality: N/A;   ILEOSTOMY     ILEOSTOMY N/A 09/08/2018   Procedure: ILEOSTOMY REVISION POSSIBLE CREATION;  Surgeon: Herbert Pun, MD;  Location: [facility] ORS;  Service: General;  Laterality: N/A;   ILEOSTOMY CLOSURE N/A 08/15/2018   Procedure: DILATION OF ILEOSTOMY STRICTURE;  Surgeon: Herbert Pun, MD;  Location: [facility] ORS;  Service: General;  Laterality: N/A;   LAPAROTOMY Right 05/04/2018   Procedure: EXPLORATORY LAPAROTOMY right colectomy right and left ostomy;  Surgeon: Herbert Pun, MD;  Location: [facility] ORS;  Service: General;  Laterality: Right;   LEFT ATRIAL APPENDAGE OCCLUSION N/A 01/11/2021   Procedure: LEFT ATRIAL APPENDAGE OCCLUSION ([place]); ABORTED PROCEDURE WITHOUT DEVICE BEING IMPLANTED; Location: [facility]; Surgeon: Mylinda Latina, MD   LUNG BIOPSY     MASTECTOMY Left    2000, [facility]   ROTATOR CUFF REPAIR Right    [place] TAKEDOWN N/A 10/23/2021   Procedure: XI ROBOTIC ASSISTED ILEOSTOMY TAKEDOWN;  Surgeon: Herbert Pun, MD;  Location: [facility] ORS;  Service: General;  Laterality: N/A;  180 minutes for the surgery part please     FAMILY HISTORY   Family History  Problem Relation Age of Onset   Breast cancer Mother 53   Cancer Mother        Breast    Cirrhosis Father    Breast cancer Paternal Aunt 33   Cancer Maternal Aunt        Breast      SOCIAL HISTORY   Social History   Tobacco Use   Smoking status: Former    Packs/day: 0.50    Years: 20.00    Pack years: 10.00    Types: Cigarettes    Quit date: 07/02/2012    Years since quitting: 9.3   Smokeless tobacco: Current    Types: Snuff   Tobacco comments:    quit 2014  Vaping Use   Vaping Use: Never used  Substance Use Topics    Alcohol use: Yes    Comment: Occasionally beer   Drug use: No     MEDICATIONS    Home Medication:    Current Medication:  Current Facility-Administered Medications:    albuterol (PROVENTIL) (2.5 MG/3ML) 0.083% nebulizer solution 3 mL, 3 mL, Nebulization, Q6H PRN, Herbert Pun, MD, 3 mL at 11/14/21 0737   alum & mag hydroxide-simeth (MAALOX/MYLANTA) 200-200-20 MG/5ML suspension 15-30 mL, 15-30 mL, Oral, Q4H PRN, Herbert Pun, MD, 30 mL at 11/14/21 1500   amiodarone (PACERONE) tablet 200 mg, 200 mg, Oral, BID, Lanney Gins, Dariane Natzke, MD, 200 mg at 11/21/21 1006   apixaban (ELIQUIS) tablet 2.5 mg, 2.5 mg, Oral, BID, Dallie Piles, RPH, 2.5 mg at 11/21/21 1005   Chlorhexidine Gluconate Cloth 2 % PADS 6 each, 6 each, Topical, Daily, Herbert Pun, MD, 6 each at 11/21/21 1153   citalopram (CELEXA) tablet 40 mg, 40 mg, Oral, [id number], [name]-Diaz, Reeves Forth, MD, 40 mg at 11/21/21 0604   diltiazem (CARDIZEM CD) 24 hr capsule 120 mg, 120 mg, Oral, Daily, Lanney Gins, Montserrat Shek, MD, 120 mg at 11/21/21 1007   famotidine (PEPCID) tablet 20 mg, 20 mg, Oral, Daily, [name]-Diaz, Edgardo, MD, 20 mg at 11/21/21 1006   feeding supplement (NEPRO CARB STEADY) liquid 237 mL, 237 mL, Oral, TID BM, [name]-Diaz, Edgardo, MD   fluticasone furoate-vilanterol (BREO ELLIPTA) 100-25 MCG/ACT 1 puff, 1 puff, Inhalation, Daily, 1 puff at 11/21/21 1010 **AND** umeclidinium bromide (INCRUSE ELLIPTA) 62.5 MCG/ACT 1 puff, 1 puff, Inhalation, Daily, Benita Gutter, RPH, 1 puff at 11/21/21 1010   gabapentin (NEURONTIN) capsule 300 mg, 300 mg, Oral, BID, [name]-Diaz, Edgardo, MD, 300 mg at 11/21/21 1006   HYDROmorphone (DILAUDID) injection 0.5 mg, 0.5 mg, Intravenous, Q2H PRN, Piscoya, Jose, MD, 0.5 mg at 11/09/21 1607   ipratropium-albuterol (DUONEB) 0.5-2.5 (3) MG/3ML nebulizer solution 3 mL, 3 mL, Nebulization, Q6H PRN, Herbert Pun, MD   lip balm (BLISTEX) ointment, , Topical, PRN, Herbert Pun, MD, Given at 11/06/21 0905   menthol-cetylpyridinium (CEPACOL) lozenge 3 mg, 1 lozenge, Oral, PRN, Herbert Pun, MD, 3 mg at 11/07/21 0522   metoprolol tartrate (LOPRESSOR) injection 5 mg, 5 mg, Intravenous, Q6H PRN, Sharion Settler, NP, 5 mg at 10/31/21 1553   midodrine (PROAMATINE) tablet 10 mg, 10 mg, Oral, TID WC, Derrel Moore, MD   multivitamin with minerals tablet 1 tablet, 1 tablet, Oral, Daily, [name]-Diaz, Edgardo, MD, 1 tablet at 11/21/21 1005   ondansetron (ZOFRAN-ODT) disintegrating tablet 4 mg, 4 mg, Oral, Q6H PRN, 4 mg at  11/07/21 1255 **OR** ondansetron (ZOFRAN) injection 4 mg, 4 mg, Intravenous, Q6H PRN, Herbert Pun, MD, 4 mg at 10/29/21 0051   oxyCODONE-acetaminophen (PERCOCET/ROXICET) 5-325 MG per tablet 1-2 tablet, 1-2 tablet, Oral, Q4H PRN, Herbert Pun, MD, 2 tablet at 11/20/21 2259   sodium chloride (OCEAN) 0.65 % nasal spray 1 spray, 1 spray, Each Nare, PRN, Sharion Settler, NP, 1 spray at 11/12/21 1206   sodium chloride flush (NS) 0.9 % injection 10-40 mL, 10-40 mL, Intracatheter, Q12H, [name]-Diaz, Edgardo, MD, 10 mL at 11/21/21 1011   sodium chloride flush (NS) 0.9 % injection 10-40 mL, 10-40 mL, Intracatheter, PRN, Herbert Pun, MD   torsemide (DEMADEX) tablet 100 mg, 100 mg, Oral, BID, Ottie Glazier, MD   Vitamin D (Ergocalciferol) (DRISDOL) capsule 50,000 Units, 50,000 Units, Oral, Q7 days, Murlean Iba, MD, 50,000 Units at 11/20/21 1453    ALLERGIES   Patient has no known allergies.     REVIEW OF SYSTEMS    Review of Systems:  Gen:  Denies  fever, sweats, chills weigh loss  HEENT: Denies blurred vision, double vision, ear pain, eye pain, hearing loss, nose bleeds, sore throat Cardiac:  No dizziness, chest pain or heaviness, chest tightness,edema Resp:   Denies cough or sputum porduction, shortness of breath,wheezing, hemoptysis,  Gi: Denies swallowing difficulty, stomach pain, nausea or vomiting,  diarrhea, constipation, bowel incontinence Gu:  Denies bladder incontinence, burning urine Ext:   Denies Joint pain, stiffness or swelling Skin: Denies  skin rash, easy bruising or bleeding or hives Endoc:  Denies polyuria, polydipsia , polyphagia or weight change Psych:   Denies depression, insomnia or hallucinations   Other:  All other systems negative   VS: BP 101/73 (BP Location: Left Leg)    Pulse 91    Temp 98.2 F (36.8 C) (Oral)    Resp 17    Ht $R'5\' 6"'Pe$  (1.676 m)    Wt 60.5 kg    SpO2 98%    BMI 21.53 kg/m      PHYSICAL EXAM    GENERAL:NAD, no fevers, chills, no weakness no fatigue HEAD: Normocephalic, atraumatic.  EYES: Pupils equal, round, reactive to light. Extraocular muscles intact. No scleral icterus.  MOUTH: Moist mucosal membrane. Dentition intact. No abscess noted.  EAR, NOSE, THROAT: Clear without exudates. No external lesions.  NECK: Supple. No thyromegaly. No nodules. No JVD.  PULMONARY: mild rhochi worse on right CARDIOVASCULAR: S1 and S2. Regular rate and rhythm. No murmurs, rubs, or gallops. No edema. Pedal pulses 2+ bilaterally.  GASTROINTESTINAL: Soft, nontender, nondistended. No masses. Positive bowel sounds. No hepatosplenomegaly.  MUSCULOSKELETAL: No swelling, clubbing, or edema. Range of motion full in all extremities.  NEUROLOGIC: Cranial nerves II through XII are intact. No gross focal neurological deficits. Sensation intact. Reflexes intact.  SKIN: No ulceration, lesions, rashes, or cyanosis. Skin warm and dry. Turgor intact.  PSYCHIATRIC: Mood, affect within normal limits. The patient is awake, alert and oriented x 3. Insight, judgment intact.       IMAGING    CT ABDOMEN PELVIS WO CONTRAST  Result Date: 11/05/2021 CLINICAL DATA:  Sepsis, fever, postoperative, rule out intra-abdominal abscess EXAM: CT ABDOMEN AND PELVIS WITHOUT CONTRAST TECHNIQUE: Multidetector CT imaging of the abdomen and pelvis was performed following the standard protocol  without IV contrast. Oral enteric contrast was administered. RADIATION DOSE REDUCTION: This exam was performed according to the departmental dose-optimization program which includes automated exposure control, adjustment of the mA and/or kV according to patient size and/or use of iterative reconstruction technique.  COMPARISON:  10/27/2021 FINDINGS: Lower chest: Small right pleural effusion associated atelectasis or consolidation. Three-vessel coronary artery calcifications. Hepatobiliary: No solid liver abnormality is seen. No gallstones, gallbladder wall thickening, or biliary dilatation. Pancreas: Unremarkable. No pancreatic ductal dilatation or surrounding inflammatory changes. Spleen: Normal in size without significant abnormality. Adrenals/Urinary Tract: Adrenal glands are unremarkable. Kidneys are normal, without renal calculi, solid lesion, or hydronephrosis. Bladder is unremarkable. Stomach/Bowel: Stomach is within normal limits. The mid small bowel is diffusely gas and contrast filled, and mildly distended, largest loops measuring 4.1 cm. Status post right hemicolectomy. Descending and sigmoid diverticula. Vascular/Lymphatic: Aortic atherosclerosis. No enlarged abdominal or pelvic lymph nodes. Reproductive: No mass or other significant abnormality. Other: Anasarca. Persistent, although improved, scattered subcutaneous emphysema about the abdominal wall (series 2, image 38). Unchanged subcutaneous fluid collections of the left and right ventral abdomen, collection on the left measuring 3.4 x 2.2 cm (series 2, image 36), collection on the right measuring 3.5 x 2.0 cm (series 2, image 43). No ascites. Musculoskeletal: No acute or significant osseous findings. IMPRESSION: 1. Unchanged subcutaneous fluid collections of the left and right ventral abdomen, collection on the left measuring 3.4 x 2.2 cm, collection on the right measuring 3.5 x 2.0 cm. These most likely reflect postoperative hematoma or seroma,  although presence or absence of infection within this fluid is not established by CT. 2. Status post right hemicolectomy. 3. The mid small bowel is diffusely gas and contrast filled, and mildly distended, largest loops measuring 4.1 cm. Findings are most consistent with postoperative ileus. 4. Right pleural effusion and anasarca. 5. Coronary artery disease. Aortic Atherosclerosis (ICD10-I70.0). Electronically Signed   By: Delanna Ahmadi M.D.   On: 11/05/2021 17:34   CT ABDOMEN PELVIS WO CONTRAST  Result Date: 10/27/2021 CLINICAL DATA:  Postop day 4 from ileostomy reversal. Vomiting. Evaluate for anastomotic leak. EXAM: CT ABDOMEN AND PELVIS WITHOUT CONTRAST TECHNIQUE: Multidetector CT imaging of the abdomen and pelvis was performed following the standard protocol without IV contrast. RADIATION DOSE REDUCTION: This exam was performed according to the departmental dose-optimization program which includes automated exposure control, adjustment of the mA and/or kV according to patient size and/or use of iterative reconstruction technique. COMPARISON:  Abdominopelvic CT 11/05/2020.  Radiographs 10/26/2021. FINDINGS: Study was performed with the patient in the right lateral decubitus position. Rectal contrast was administered. Lower chest: Apparent right middle lobe collapse with a small amount of adjacent pleural fluid, incompletely visualized. Mild dependent atelectasis in both lower lobes. There is atherosclerosis of the aorta and coronary arteries. Hepatobiliary: Stable low-density liver lesions, likely cysts based on stability. Stable mild biliary dilatation status post cholecystectomy. Pancreas: Unremarkable. No pancreatic ductal dilatation or surrounding inflammatory changes. Spleen: Normal in size without focal abnormality. Adrenals/Urinary Tract: Both adrenal glands appear normal. Both kidneys demonstrate mild perinephric soft tissue stranding, similar to previous study. No evidence of urinary tract calculus  or hydronephrosis. The bladder appears unremarkable for its degree of distention. Stomach/Bowel: No oral contrast was administered. Rectal contrast has passed retrograde through the colon and through the ileocolonic anastomosis at the level of the mid transverse colon status post right hemicolectomy. There is retrograde filling of the distal small bowel. No anastomotic leak. The stomach and small bowel are fluid-filled and mildly distended. No evidence of bowel wall thickening or pneumatosis. Vascular/Lymphatic: There are no enlarged abdominal or pelvic lymph nodes. Diffuse aortic and branch vessel atherosclerosis. Reproductive: Stable appearance of the uterus and adnexa. No adnexal mass. Tubal ligation clips noted. Other: As seen  on the postoperative radiographs, there is a large amount of soft tissue emphysema throughout the anterior abdominal wall which extends from the perineum into the presternal soft tissues. There are small focal fluid collections within the right and left anterior abdominal wall, likely related to recent laparoscopic procedure. No free intraperitoneal air, ascites or focal extraluminal fluid collections identified within the peritoneal cavity. Musculoskeletal: No acute or significant osseous findings. Stable chronic mild superior endplate compression fractures at L3 and L5. IMPRESSION: 1. Status post recent ileostomy reversal and ileocolonic anastomosis. The anastomosis is patent, without leak. 2. Nonspecific gastric and proximal small bowel distension without focal transition point, likely related to a postoperative ileus. Recommend radiographic follow-up. Patient may benefit from a nasogastric tube. 3. As seen on postoperative radiographs, there is extensive soft tissue emphysema throughout the abdominal wall which is likely related to the laparoscopic procedure. Soft tissue infection should be excluded clinically. Small fluid collections in the anterior abdominal wall are attributed to  the procedure, and no unexpected collections are identified. There is no pneumoperitoneum or abnormal intra-abdominal fluid collection. 4. Atelectasis at both lung bases with apparent partial right middle lobe collapse. 5. Extensive Aortic Atherosclerosis (ICD10-I70.0). Electronically Signed   By: Richardean Sale M.D.   On: 10/27/2021 16:28   DG Chest 1 View  Result Date: 10/26/2021 CLINICAL DATA:  Hypotension EXAM: CHEST  1 VIEW COMPARISON:  11/16/2020, 06/05/2020, PET CT 12/01/2019, CT chest 11/06/2019, 11/06/2019 FINDINGS: Mild cardiomegaly with central vascular congestion. Small pleural effusions and diffuse increased interstitial opacity likely due to edema. Aortic atherosclerosis. No pneumothorax. Irregular opacity with mild distortion and nodularity in the right upper lobe and right hilus similar compared to intermittent previous exams and presumably due to scarring. IMPRESSION: 1. Cardiomegaly with vascular congestion, small pleural effusions and mild diffuse interstitial edema 2. Irregular and nodular opacities in the right hilus and upper lobe present on previous exams and probably due to scarring Electronically Signed   By: Donavan Foil M.D.   On: 10/26/2021 17:54   DG Chest 2 View  Result Date: 11/18/2021 CLINICAL DATA:  Admitted for acute kidney injury, continued fatigue. Recent pleural effusion on RIGHT. EXAM: CHEST - 2 VIEW COMPARISON:  Chest x-rays dated 11/16/2021, 11/14/2021, 01/20/2020 and 07/31/2020. FINDINGS: Heart size and mediastinal contours are stable. Chronic scarring is again within the RIGHT perihilar lung. Coarse lung markings are seen throughout both lungs. No confluent opacities to suggest superimposed pneumonia. Small bilateral pleural effusions, RIGHT greater than LEFT. No pneumothorax is seen. RIGHT-sided PICC line appears well positioned with tip at the level of the lower SVC/cavoatrial junction. No acute or suspicious osseous abnormality. Extensive aortic atherosclerosis.  Mildly distended gas-filled loops of small bowel within the upper abdomen, incompletely imaged. IMPRESSION: 1. Small bilateral pleural effusions, RIGHT greater than LEFT. 2. Coarse lung markings throughout both lungs, compatible with chronic interstitial lung disease/fibrosis. No evidence of superimposed pneumonia or pulmonary edema. 3. Aortic atherosclerosis. 4. Suspect continued postoperative ileus with mildly distended gas-filled loops of small bowel partially imaged within the upper abdomen. Electronically Signed   By: Franki Cabot M.D.   On: 11/18/2021 12:32   DG Chest 2 View  Result Date: 11/09/2021 CLINICAL DATA:  Shortness of breath.  Mild chest pain.  Smoker. EXAM: CHEST - 2 VIEW COMPARISON:  11/05/2021 FINDINGS: Right PICC tip in the region of the inferior aspect of the superior vena cava without significant change. The nasogastric tube has been removed. Normal sized heart. Stable linear scarring in the right upper  lobe. No significant change in a small right pleural effusion and right basilar airspace opacity. Clear left lung. Mildly tortuous and calcified thoracic aorta. No acute bony abnormality. IMPRESSION: Stable small right pleural effusion and right basilar atelectasis and/or pneumonia. Electronically Signed   By: Claudie Revering M.D.   On: 11/09/2021 10:17   DG Abd 1 View  Result Date: 11/03/2021 CLINICAL DATA:  NG tube placement EXAM: ABDOMEN - 1 VIEW COMPARISON:  11/03/2021 11:05 a.m. FINDINGS: Advancement of NG tube with tip and side port below the GE junction. Redemonstrated small bowel dilatation, not significantly changed from the prior exam. The stomach is not distended. The lower abdomen is not included for evaluation. IMPRESSION: Interval advancement of nasogastric tube, with tip and side port overlying the stomach. Overall unchanged small bowel dilatation in the imaged portion of the abdomen. Electronically Signed   By: Merilyn Baba M.D.   On: 11/03/2021 16:01   DG Abd 1  View  Result Date: 10/28/2021 CLINICAL DATA:  NG tube placement EXAM: ABDOMEN - 1 VIEW COMPARISON:  10/26/2021 FINDINGS: Limited radiograph of the lower chest and upper abdomen was obtained for the purposes of enteric tube localization. Enteric tube is seen coursing below the diaphragm with distal tipterminating within the proximal stomach. Side port terminates within the distal esophagus. Similar degree of gaseous distension of large and small bowel within the visualized abdomen. IMPRESSION: Enteric tube with distal tip terminating within the proximal stomach, side port terminating within the distal esophagus. Recommend advancement 7-10 cm. Electronically Signed   By: Davina Poke D.O.   On: 10/28/2021 15:48   DG Abd 1 View  Result Date: 10/26/2021 CLINICAL DATA:  Hypotension history of ileostomy take down EXAM: ABDOMEN - 1 VIEW COMPARISON:  11/05/2020 FINDINGS: Clips in the pelvis. Moderate air distension of the stomach. Air-filled dilated small bowel up to 3.5 cm in the central abdomen. Extensive mottled appearance of the abdominal soft tissues presumably due to soft tissue emphysema related to recent laparoscopic procedure. IMPRESSION: 1. Air distension of stomach and small bowel either due to ileus or possible obstruction. 2. Diffuse mottled appearance of soft tissues felt consistent with soft tissue emphysema likely due to recent laparoscopic procedure Electronically Signed   By: Donavan Foil M.D.   On: 10/26/2021 17:51   US RENAL  Result Date: 10/25/2021 CLINICAL DATA:  Acute kidney injury EXAM: RENAL / URINARY TRACT ULTRASOUND COMPLETE COMPARISON:  CT 11/05/2020 FINDINGS: Right Kidney: Renal measurements: 8.9 x 4.1 x 4.2 cm = volume: 79.8 mL. Cortex slightly echogenic. No mass or hydronephrosis Left Kidney: Renal measurements: 7.5 x 4.4 x 4 cm = volume: 88.5 mL. Cortex slightly echogenic. No mass or hydronephrosis. Bladder: Appears normal for degree of bladder distention. Other: None.  IMPRESSION: Slight increased cortical echogenicity suggesting medical renal disease. No hydronephrosis. Electronically Signed   By: Donavan Foil M.D.   On: 10/25/2021 16:28   DG Chest Port 1 View  Result Date: 11/16/2021 CLINICAL DATA:  Shortness of breath EXAM: PORTABLE CHEST 1 VIEW COMPARISON:  Previous studies including the examination of 11/14/2021 FINDINGS: Transverse diameter of heart is increased. Central pulmonary vessels are prominent. Linear densities in the right parahilar region have not changed significantly. Small bilateral pleural effusions are seen, more so on the right side. Crowding of markings in the medial lower lung fields suggest atelectasis/pneumonia. There is no pneumothorax. Tip of PICC line is seen in superior vena cava close to right atrium. IMPRESSION: Cardiomegaly. Central pulmonary vessels are prominent. Prominence of  interstitial markings in the right parahilar region and both lower lung fields may suggest interstitial edema or pneumonia. Small bilateral pleural effusions, more so on the right side. Electronically Signed   By: Elmer Picker M.D.   On: 11/16/2021 08:13   DG Chest Port 1 View  Result Date: 11/14/2021 CLINICAL DATA:  Atelectasis, history of small-bowel obstruction EXAM: PORTABLE CHEST 1 VIEW COMPARISON:  11/13/2021 FINDINGS: No significant change in AP portable chest radiograph, with cardiomegaly and right upper extremity PICC. Diffuse bilateral interstitial pulmonary opacity, small bilateral pleural effusions, and bandlike scarring or atelectasis of the right midlung, unchanged. IMPRESSION: No significant change in AP portable chest radiograph, with cardiomegaly, probable edema, and bilateral pleural effusions. No new airspace opacity. Electronically Signed   By: Delanna Ahmadi M.D.   On: 11/14/2021 09:58   DG Chest Port 1 View  Result Date: 11/13/2021 CLINICAL DATA:  Post right thoracentesis EXAM: PORTABLE CHEST 1 VIEW COMPARISON:  Chest x-ray dated  November 12, 2021 FINDINGS: Cardiac and mediastinal contours are unchanged. Linear nodular opacity of the right upper lobe, unchanged from prior exam. No new parenchymal opacity. Interval decreased size of small right pleural effusion. No evidence pneumothorax. IMPRESSION: No evidence of pneumothorax. Electronically Signed   By: Yetta Glassman M.D.   On: 11/13/2021 11:46   DG Chest Port 1 View  Result Date: 11/12/2021 CLINICAL DATA:  Acute respiratory failure with hypoxia. EXAM: PORTABLE CHEST 1 VIEW COMPARISON:  November 09, 2021 FINDINGS: Right upper extremity PICC with tip near the superior cavoatrial junction. The heart size and mediastinal contours are within normal limits. Stable linear scarring in the right upper lobe. Slightly increased small right pleural effusion with adjacent airspace opacities. Increased conspicuity of the irregular nodular areas of consolidation in the right mid lung. The visualized skeletal structures are unremarkable. IMPRESSION: 1. Slightly increased small right pleural effusion with adjacent airspace opacities. 2. Increased conspicuity of the irregular nodular areas of consolidation in the right mid lung. Electronically Signed   By: Dahlia Bailiff M.D.   On: 11/12/2021 08:55   DG Chest Port 1 View  Result Date: 11/05/2021 CLINICAL DATA:  Fever. EXAM: PORTABLE CHEST 1 VIEW COMPARISON:  Chest radiograph 10/26/2021. FINDINGS: Right upper extremity PICC line tip projects over the superior vena cava. Enteric tube is present. The side port is at the level of the distal esophagus. Stable enlarged cardiac and mediastinal contours. Similar-appearing distortion and consolidation within the mid right lung. Bibasilar atelectasis, right-greater-than-left. Probable small right pleural effusion. No pneumothorax. IMPRESSION: Enteric tube side-port at the distal esophagus, recommend advancement. Right upper extremity PICC line tip projects over the superior vena cava. Similar-appearing  irregular and nodular areas of consolidation within the right mid lung. These results will be called to the ordering clinician or representative by the Radiologist Assistant, and communication documented in the PACS or [employer]. Electronically Signed   By: Lovey Newcomer M.D.   On: 11/05/2021 08:04   DG Abd 2 Views  Result Date: 11/06/2021 CLINICAL DATA:  Abdominal pain EXAM: ABDOMEN - 2 VIEW COMPARISON:  Abdominal x-ray 11/03/2021 FINDINGS: Enteric tube identified with the tip in the lower esophagus. Mildly distended loops of small bowel are again seen throughout the abdomen measuring up to 3.3 cm in diameter. No free air identified on the decubitus view. No suspicious calcifications identified. IMPRESSION: 1. Enteric tube tip is in the lower esophagus, advancement recommended. 2. Mildly distended loops of bowel throughout the abdomen which could be seen with ileus or obstruction.  Continued follow-up recommended. Electronically Signed   By: Ofilia Neas M.D.   On: 11/06/2021 10:42   DG Abd 2 Views  Result Date: 11/03/2021 CLINICAL DATA:  Abdominal distention EXAM: ABDOMEN - 2 VIEW COMPARISON:  Previous studies including the examination of 11/02/2021 FINDINGS: There is interval worsening of small bowel dilation measuring up to 4.9 cm in diameter. Tip enteric tube is noted at the gastroesophageal junction pointing cephalad. There is no distention of stomach. There is interval passage of contrast from the colon. There is evidence of previous tubal ligation. IMPRESSION: There is interval increase in small bowel dilation suggesting high-grade small bowel obstruction. Tip of enteric tube is at the gastroesophageal junction pointing cephalad. NG tube should be advanced 5-10 cm to place the tip and side port within the stomach. Electronically Signed   By: Elmer Picker M.D.   On: 11/03/2021 12:36   DG Abd 2 Views  Result Date: 10/31/2021 CLINICAL DATA:  Ileus following gastrointestinal surgery,  colostomy takedown 10/23/2021 EXAM: ABDOMEN - 2 VIEW COMPARISON:  10/28/2021 FINDINGS: Nasogastric tube in proximal stomach. Persistent dilatation of small bowel loops in the abdomen without wall thickening. Small amount retained contrast in colon. Levoconvex lumbar scoliosis. Foci of soft tissue gas at RIGHT lateral abdominal wall consistent with preceding surgery. Atelectasis at RIGHT lung base. IMPRESSION: Persistent small bowel ileus. Electronically Signed   By: Lavonia Dana M.D.   On: 10/31/2021 10:59   DG Abd Portable 1V-Small Bowel Obstruction Protocol-initial, 8 hr delay  Result Date: 11/02/2021 CLINICAL DATA:  8 hour follow-up small-bowel delay EXAM: PORTABLE ABDOMEN - 1 VIEW COMPARISON:  Film from the previous day. FINDINGS: Gastric catheter is noted within the stomach. Contrast material administered now lies within the dilated small bowel as well as within the colon. These changes are consistent with a partial small bowel obstruction. No free air is noted. IMPRESSION: Passage of contrast into the colon consistent with a partial small bowel obstruction. Electronically Signed   By: Inez Catalina M.D.   On: 11/02/2021 03:13   DG Abd Portable 1V  Result Date: 11/01/2021 CLINICAL DATA:  Enteric catheter placement EXAM: PORTABLE ABDOMEN - 1 VIEW COMPARISON:  10/31/2021 FINDINGS: Frontal view of the lower chest and upper abdomen demonstrates enteric catheter tip and side port projecting over the gastric fundus. Right-sided PICC tip overlies atriocaval junction. Cardiac silhouette is enlarged. Small right pleural effusion. Stable vascular congestion. Stable distended gas-filled loops of small bowel, likely ileus given recent surgery. IMPRESSION: 1. Enteric catheter projecting over gastric fundus. 2. Stable small bowel dilatation consistent with ileus. Electronically Signed   By: Randa Ngo M.D.   On: 11/01/2021 16:02   ECHOCARDIOGRAM COMPLETE  Result Date: 10/27/2021    ECHOCARDIOGRAM REPORT    Patient Name:   Danielle [name] [facility] Date of Exam: 10/27/2021 Medical Rec #:  [id number]       Height:       66.0 in Accession #:    [id number]      Weight:       124.0 lb Date of Birth:  11-11-1955       BSA:          1.632 m Patient Age:    12 years        BP:           144/84 mmHg Patient Gender: F               HR:  95 bpm. Exam Location:  [facility] Procedure: 2D Echo, Cardiac Doppler and Color Doppler Indications:     Elevated troponin  History:         Patient has prior history of Echocardiogram examinations, most                  recent 11/07/2019. COPD, Arrythmias:Atrial Fibrillation,                  Signs/Symptoms:Murmur; Risk Factors:Hypertension.  Sonographer:     Sherrie Sport Referring Phys:  [place] Diagnosing Phys: Donnelly Angelica IMPRESSIONS  1. Left ventricular ejection fraction, by estimation, is 60 to 65%. The left ventricle has normal function. The left ventricle has no regional wall motion abnormalities. Left ventricular diastolic parameters are consistent with Grade I diastolic dysfunction (impaired relaxation).  2. Right ventricular systolic function is normal. The right ventricular size is mildly enlarged.  3. Right atrial size was mildly dilated.  4. The mitral valve is normal in structure. Moderate mitral valve regurgitation. No evidence of mitral stenosis.  5. Tricuspid valve regurgitation is moderate.  6. The aortic valve is calcified. Aortic valve regurgitation is not visualized. Mild aortic valve stenosis.  7. The inferior vena cava is normal in size with greater than 50% respiratory variability, suggesting right atrial pressure of 3 mmHg. Conclusion(s)/Recommendation(s): CALCIFIED AORTIC VALVE WITH MILD AS. VMax 2.1 m/s. Dimensionless index 0.4. FINDINGS  Left Ventricle: Left ventricular ejection fraction, by estimation, is 60 to 65%. The left ventricle has normal function. The left ventricle has no regional wall motion abnormalities. The left ventricular internal cavity size was  normal in size. There is  no left ventricular hypertrophy. Left ventricular diastolic parameters are consistent with Grade I diastolic dysfunction (impaired relaxation). Right Ventricle: The right ventricular size is mildly enlarged. No increase in right ventricular wall thickness. Right ventricular systolic function is normal. Left Atrium: Left atrial size was normal in size. Right Atrium: Right atrial size was mildly dilated. Pericardium: There is no evidence of pericardial effusion. Mitral Valve: The mitral valve is normal in structure. Moderate mitral valve regurgitation. No evidence of mitral valve stenosis. MV peak gradient, 3.1 mmHg. The mean mitral valve gradient is 1.0 mmHg. Tricuspid Valve: The tricuspid valve is normal in structure. Tricuspid valve regurgitation is moderate . No evidence of tricuspid stenosis. Aortic Valve: The aortic valve is calcified. Aortic valve regurgitation is not visualized. Mild aortic stenosis is present. Aortic valve mean gradient measures 9.4 mmHg. Aortic valve peak gradient measures 16.8 mmHg. Aortic valve area, by VTI measures 1.21 cm. Pulmonic Valve: The pulmonic valve was not well visualized. Pulmonic valve regurgitation is not visualized. No evidence of pulmonic stenosis. Aorta: The aortic root is normal in size and structure. Venous: The inferior vena cava is normal in size with greater than 50% respiratory variability, suggesting right atrial pressure of 3 mmHg. IAS/Shunts: No atrial level shunt detected by color flow Doppler.  LEFT VENTRICLE PLAX 2D LVIDd:         4.80 cm   Diastology LVIDs:         3.00 cm   LV e' medial:    5.87 cm/s LV PW:         1.10 cm   LV E/e' medial:  13.9 LV IVS:        0.75 cm   LV e' lateral:   12.40 cm/s LVOT diam:     2.00 cm   LV E/e' lateral: 6.6 LV SV:  45 LV SV Index:   28 LVOT Area:     3.14 cm  RIGHT VENTRICLE RV Basal diam:  3.80 cm RV S prime:     12.20 cm/s TAPSE (M-mode): 1.8 cm LEFT ATRIUM             Index         RIGHT ATRIUM           Index LA diam:        3.20 cm 1.96 cm/m   RA Area:     19.30 cm LA Vol (A2C):   58.8 ml 36.03 ml/m  RA Volume:   63.90 ml  39.15 ml/m LA Vol (A4C):   38.7 ml 23.71 ml/m LA Biplane Vol: 49.1 ml 30.08 ml/m  AORTIC VALVE                     PULMONIC VALVE AV Area (Vmax):    1.04 cm      PV Vmax:        0.59 m/s AV Area (Vmean):   0.91 cm      PV Vmean:       35.200 cm/s AV Area (VTI):     1.21 cm      PV VTI:         0.109 m AV Vmax:           205.20 cm/s   PV Peak grad:   1.4 mmHg AV Vmean:          140.200 cm/s  PV Mean grad:   1.0 mmHg AV VTI:            0.373 m       RVOT Peak grad: 4 mmHg AV Peak Grad:      16.8 mmHg AV Mean Grad:      9.4 mmHg LVOT Vmax:         67.90 cm/s LVOT Vmean:        40.500 cm/s LVOT VTI:          0.144 m LVOT/AV VTI ratio: 0.39  AORTA Ao Root diam: 3.17 cm MITRAL VALVE               TRICUSPID VALVE MV Area (PHT): 4.86 cm    TR Peak grad:   52.4 mmHg MV Area VTI:   3.01 cm    TR Vmax:        362.00 cm/s MV Peak grad:  3.1 mmHg MV Mean grad:  1.0 mmHg    SHUNTS MV Vmax:       0.88 m/s    Systemic VTI:  0.14 m MV Vmean:      55.0 cm/s   Systemic Diam: 2.00 cm MV Decel Time: 156 msec    Pulmonic VTI:  0.157 m MV E velocity: 81.40 cm/s MV A velocity: 51.40 cm/s MV E/A ratio:  1.58 Donnelly Angelica Electronically signed by Donnelly Angelica Signature Date/Time: 10/27/2021/4:07:10 PM    Final    [place] EKG SITE RITE  Result Date: 11/01/2021 If Site Rite image not attached, placement could not be confirmed due to current cardiac rhythm.  US THORACENTESIS ASP PLEURAL SPACE W/IMG GUIDE  Result Date: 11/13/2021 INDICATION: Right pleural effusion with shortness breath request received for diagnostic and therapeutic thoracentesis. EXAM: ULTRASOUND GUIDED RIGHT THORACENTESIS MEDICATIONS: Local 1% lidocaine only. COMPLICATIONS: None immediate. PROCEDURE: An ultrasound guided thoracentesis was thoroughly discussed with the patient and questions answered. The benefits, risks,  alternatives and complications were also  discussed. The patient understands and wishes to proceed with the procedure. Written consent was obtained. Ultrasound was performed to localize and mark an adequate pocket of fluid in the right chest. The area was then prepped and draped in the normal sterile fashion. 1% Lidocaine was used for local anesthesia. Under ultrasound guidance a 19 gauge, 7-cm, Yueh catheter was introduced. Thoracentesis was performed. The catheter was removed and a dressing applied. FINDINGS: A total of approximately 1 L of amber colored fluid was removed. Samples were sent to the laboratory as requested by the clinical team. IMPRESSION: Successful ultrasound guided right thoracentesis yielding 1 L of pleural fluid. This exam was performed by Tsosie Billing PA-C, and was supervised and interpreted by Dr. Denna Haggard. Electronically Signed   By: Albin Felling M.D.   On: 11/13/2021 12:05       ASSESSMENT/PLAN   Acute on chronic hypoxemic respiratory failure   - Due to moderate right pleural effusion with associated right lower lobe complete atelectasis   -s/p thoracentesis - 1 L - aspirated -                 -patient would benefit from diuresis - nephrology on case appreciate collaboration  Moderate pleural effusion   S/p aspiration - fluid pattern studies in process     Suspect transudative fluid due to CKD - Noted nephrology on case - appreciate input   Right lower lobe atelectasis   - patient would benefit from recruitment maneuvers    - will order RT Metaneb with albuterol BID    - Incentive spirometry and flutter valve PRN  CKD   - likely culprit with fluid overload contributing to pulmonary and gut edema   Atrial fibrillation - chronic - potential for rapid ventricular response due to albuterol/ipratropium therapy     Thank you for allowing me to participate in the care of this patient.    Patient/Family are satisfied with care plan and all questions have been  answered.  This document was prepared using Dragon voice recognition software and may include unintentional dictation errors.     Ottie Glazier, M.D.  Division of Pulmonary & [place]         .fa

## 2021-11-21 NOTE — Progress Notes (Signed)
Progress Note    Danielle [name]  QMV:[id number] DOB: 1956-06-22  DOA: 10/23/2021 PCP: [facility] [place]      Brief Narrative:    Medical records reviewed and are as summarized below:   Brief hospital course: 66 year old woman with medical history significant for anemia, anxiety, aortic atherosclerosis, aortic stenosis, CKD stage IV, atrial fibrillation, breast cancer with history of chemotherapy and radiation therapy, COPD, depression, chronic diastolic and systolic CHF, GERD, hyperlipidemia, hypertension, lipidemia, small bowel obstruction, vitamin D deficiency.  She was admitted for elective ileostomy takedown.  Hospitalist service consulted for medical management.  Hospital course complicated by prolonged postoperative ileus, anemia requiring transfusion, aspiration pneumonia, AKI, acute on chronic systolic and diastolic CHF, large pleural effusion requiring thoracentesis.        Assessment/Plan:   Principal Problem:   Ileostomy status ([place]) Active Problems:   Hyponatremia   Acute respiratory failure with hypoxia (HCC)   Weakness   Acute kidney injury superimposed on CKD (HCC)   HTN (hypertension)   AF (paroxysmal atrial fibrillation) (HCC)   Normocytic anemia   Depression   Pleural effusion due to CHF (congestive heart failure) (HCC)   Hypokalemia   Acute on chronic combined systolic and diastolic CHF (congestive heart failure) (HCC)   Hyperphosphatemia   Hypomagnesemia   Anemia of chronic disease   Ileus following gastrointestinal surgery ([place])   Aspiration pneumonia of right lower lobe due to gastric secretions [facility])   Nutrition Problem: Moderate Malnutrition Etiology: cancer and cancer related treatments  Signs/Symptoms: mild fat depletion, mild muscle depletion, severe muscle depletion   Body mass index is 21.53 kg/m.   Ileus following reversal of ileostomy: Follow-up with general surgeon.  Acute on chronic hypoxic respiratory  failure: Taper down oxygen as able.  She may need 4 L/min oxygen at home.  She uses 2 L/min oxygen at home as needed  Acute on chronic combined systolic and diastolic CHF: IV Lasix has been switched to oral Lasix.  2D echo on this admission showed recovered EF estimated at 60 to 28%, grade 1 diastolic dysfunction.  AKI on CKD stage 3b: Creatinine is stable.  Follow-up with nephrologist.  Right pleural effusion: S/p thoracentesis with removal of 1 L of fluid 10/13/2022.  Hypotension: BP stable.  Continue midodrine  Paroxysmal atrial fibrillation: Continue amiodarone, Cardizem and Eliquis.  COPD, ?  Interstitial lung disease on chest x-ray: Continue bronchodilators  Aspiration pneumonia of right lower lobe: Completed antibiotics  Anemia of chronic disease: H&H is stable       Diet Order             DIET SOFT Room service appropriate? Yes; Fluid consistency: Thin  Diet effective now                       Consultants: General surgeon Pulmonologist Nephrologist  Procedures: Laparoscopic ileostomy reversal on 10/23/2021    Medications:    amiodarone  200 mg Oral BID   apixaban  2.5 mg Oral BID   Chlorhexidine Gluconate Cloth  6 each Topical Daily   citalopram  40 mg Oral BH-q7a   diltiazem  120 mg Oral Daily   famotidine  20 mg Oral Daily   feeding supplement (NEPRO CARB STEADY)  237 mL Oral TID BM   fluticasone furoate-vilanterol  1 puff Inhalation Daily   And   umeclidinium bromide  1 puff Inhalation Daily   furosemide  40 mg Oral BID   gabapentin  300 mg Oral BID   midodrine  5 mg Oral TID WC   multivitamin with minerals  1 tablet Oral Daily   sodium chloride flush  10-40 mL Intracatheter Q12H   Vitamin D (Ergocalciferol)  50,000 Units Oral Q7 days   Continuous Infusions:   Anti-infectives (From admission, onward)    Start     Dose/Rate Route Frequency Ordered Stop   11/07/21 0800  vancomycin (VANCOREADY) IVPB 1250 mg/250 mL  Status:  Discontinued         1,250 mg 166.7 mL/hr over 90 Minutes Intravenous Every 48 hours 11/05/21 0623 11/06/21 0954   11/06/21 1200  Ampicillin-Sulbactam (UNASYN) 3 g in sodium chloride 0.9 % 100 mL IVPB        3 g 200 mL/hr over 30 Minutes Intravenous Every 12 hours 11/06/21 0955 11/15/21 0055   11/05/21 0700  piperacillin-tazobactam (ZOSYN) IVPB 3.375 g  Status:  Discontinued        3.375 g 12.5 mL/hr over 240 Minutes Intravenous Every 8 hours 11/05/21 0609 11/06/21 0950   11/05/21 0700  vancomycin (VANCOREADY) IVPB 1500 mg/300 mL        1,500 mg 150 mL/hr over 120 Minutes Intravenous  Once 11/05/21 0610 11/05/21 1006   10/23/21 0624  sodium chloride 0.9 % with cefoTEtan (CEFOTAN) ADS Med       Note to Pharmacy: Jordan Hawks H: cabinet override      10/23/21 0624 10/23/21 0741   10/23/21 0615  cefoTEtan (CEFOTAN) 2 g in sodium chloride 0.9 % 100 mL IVPB        2 g 200 mL/hr over 30 Minutes Intravenous On call to O.R. 10/23/21 0608 10/23/21 0735              Family Communication/Anticipated D/C date and plan/Code Status   DVT prophylaxis: apixaban (ELIQUIS) tablet 2.5 mg Start: 11/14/21 2200 SCD's Start: 10/23/21 1801 apixaban (ELIQUIS) tablet 2.5 mg     Code Status: Full Code  Family Communication: None Disposition Plan: Plan to discharge home when medically stable   Status is: Inpatient  Remains inpatient appropriate because: On IV Lasix, hypoxia         Subjective:   Interval events noted.  She complains of shortness of breath with exertion.  No chest pain or palpitations.  Her nurse and student nurse were at the bedside.   Objective:    Vitals:   11/20/21 2012 11/21/21 0224 11/21/21 0735 11/21/21 1131  BP: 96/76 96/66 92/63  101/73  Pulse: 100 80 81 91  Resp: 18 18 16 17   Temp: 98.6 F (37 C) 98.6 F (37 C) 97.8 F (36.6 C) 98.2 F (36.8 C)  TempSrc:    Oral  SpO2: 98% 99% 97% 98%  Weight:      Height:       No data found.   Intake/Output Summary  (Last 24 hours) at 11/21/2021 1217 Last data filed at 11/20/2021 2300 Gross per 24 hour  Intake 800 ml  Output 500 ml  Net 300 ml   Filed Weights   11/17/21 0449 11/18/21 0500 11/20/21 0605  Weight: 62.3 kg 62.4 kg 60.5 kg    Exam:  GEN: NAD SKIN: Warm and dry EYES: EOMI ENT: MMM CV: RRR PULM: CTA B ABD: soft, ND, NT, +BS CNS: AAO x 3, non focal EXT: No edema or tenderness           Data Reviewed:   I have personally reviewed following labs and imaging studies:  Labs:  Labs show the following:   Basic Metabolic Panel: Recent Labs  Lab 11/15/21 1027 11/16/21 0506 11/17/21 0511 11/18/21 0510 11/19/21 0905  NA 137 141 138 138 134*  K 3.2* 3.8 4.6 5.4* 4.6  CL 100 103 101 100 97*  CO2 30 31 29  32 29  GLUCOSE 120* 84 79 96 85  BUN 23 22 22 23 23   CREATININE 1.78* 1.75* 1.87* 1.98* 2.15*  CALCIUM 8.5* 8.5* 8.6* 8.8* 8.9  MG  --   --  1.3* 2.0  --   PHOS  --   --  3.8  --  4.6   GFR Estimated Creatinine Clearance: 23.8 mL/min (A) (by C-G formula based on SCr of 2.15 mg/dL (H)). Liver Function Tests: Recent Labs  Lab 11/17/21 0511 11/19/21 0905  ALBUMIN 2.7* 2.9*   No results for input(s): LIPASE, AMYLASE in the last 168 hours. No results for input(s): AMMONIA in the last 168 hours. Coagulation profile No results for input(s): INR, PROTIME in the last 168 hours.  CBC: Recent Labs  Lab 11/15/21 1027 11/16/21 0506 11/17/21 0511 11/18/21 0510 11/21/21 0600  WBC 7.9 6.8 7.2 8.8 6.0  HGB 8.0* 7.7* 7.9* 7.9* 8.1*  HCT 24.8* 24.3* 24.3* 24.6* 25.9*  MCV 94.7 95.7 94.9 94.6 94.9  PLT 321 361 317 337 335   Cardiac Enzymes: No results for input(s): CKTOTAL, CKMB, CKMBINDEX, TROPONINI in the last 168 hours. BNP (last 3 results) No results for input(s): PROBNP in the last 8760 hours. CBG: Recent Labs  Lab 11/14/21 2021 11/19/21 0727  GLUCAP 109* 80   D-Dimer: No results for input(s): DDIMER in the last 72 hours. Hgb A1c: No results for  input(s): HGBA1C in the last 72 hours. Lipid Profile: No results for input(s): CHOL, HDL, LDLCALC, TRIG, CHOLHDL, LDLDIRECT in the last 72 hours. Thyroid function studies: No results for input(s): TSH, T4TOTAL, T3FREE, THYROIDAB in the last 72 hours.  Invalid input(s): FREET3 Anemia work up: No results for input(s): VITAMINB12, FOLATE, FERRITIN, TIBC, IRON, RETICCTPCT in the last 72 hours. Sepsis Labs: Recent Labs  Lab 11/16/21 0506 11/17/21 0511 11/18/21 0510 11/21/21 0600  WBC 6.8 7.2 8.8 6.0    Microbiology Recent Results (from the past 240 hour(s))  Body fluid culture w Gram Stain     Status: None   Collection Time: 11/13/21 11:00 AM   Specimen: PATH Cytology Pleural fluid  Result Value Ref Range Status   Specimen Description   Final    PLEURAL Performed at [facility], [address] [place], [place] [address]    Special Requests   Final    NONE Performed at [facility], [address] [place], [place] [address]    Gram Stain   Final    WBC PRESENT,BOTH PMN AND MONONUCLEAR NO ORGANISMS SEEN CYTOSPIN SMEAR    Culture   Final    NO GROWTH 3 DAYS Performed at [facility], [place] [address] [place], [place] [address]    Report Status 11/16/2021 FINAL  Final    Procedures and diagnostic studies:  No results found.             LOS: 29 days   [place] Copywriter, advertising on [email or web address]. If 7PM-7AM, please contact night-coverage at [email or web address]     11/21/2021, 12:17 PM

## 2021-11-21 NOTE — Progress Notes (Signed)
Physical Therapy Treatment Patient Details Name: Danielle [name] MRN: [id number] DOB: 16-Mar-1956 Today's Date: 11/21/2021   History of Present Illness 66 y/o female s/p iliostomy take down 1/09.    PT Comments    Pt is making good progress towards goals with ability to ambulate and tolerate there-ex this date. Mobility performed on 4L of O2 with use of dynamap to monitor vitals and wavelength. O2 at 92% with exertion and HR at 125bpm with exertion. Does fatigue quickly and becomes dizzy requesting to return back to room. Slow progress towards goals. Will continue to progress as able.   Recommendations for follow up therapy are one component of a multi-disciplinary discharge planning process, led by the attending physician.  Recommendations may be updated based on patient status, additional functional criteria and insurance authorization.  Follow Up Recommendations  Home health PT     Assistance Recommended at Discharge Intermittent Supervision/Assistance  Patient can return home with the following A little help with walking and/or transfers;A little help with bathing/dressing/bathroom   Equipment Recommendations  Rolling walker (2 wheels)    Recommendations for Other Services       Precautions / Restrictions Precautions Precautions: Fall Restrictions Weight Bearing Restrictions: No     Mobility  Bed Mobility Overal bed mobility: Modified Independent Bed Mobility: Supine to Sit     Supine to sit: Modified independent (Device/Increase time), HOB elevated     General bed mobility comments: safe technique with ease of transition to EOB    Transfers Overall transfer level: Modified independent Equipment used: None Transfers: Sit to/from Stand Sit to Stand: Modified independent (Device/Increase time)           General transfer comment: uses B hands for transfer. Once standing, holds onto RW or counter    Ambulation/Gait Ambulation/Gait assistance: Supervision Gait  Distance (Feet): 200 Feet Assistive device: Rolling walker (2 wheels) Gait Pattern/deviations: Step-through pattern       General Gait Details: ambulated 1/2 way around RN station, began to feel dizzy, turned around and was able to ambulate back. Increased to CGA on way back for safety, however no LOB noted. 2 standing rest breaks performed throughout mobility effort. All mobility performed on 4L of O2 with sats remained between 88-92% and HR at 125 with exertion.   Stairs             Wheelchair Mobility    Modified Rankin (Stroke Patients Only)       Balance Overall balance assessment: Needs assistance Sitting-balance support: No upper extremity supported Sitting balance-Leahy Scale: Good     Standing balance support: Bilateral upper extremity supported Standing balance-Leahy Scale: Fair                              Cognition Arousal/Alertness: Awake/alert Behavior During Therapy: WFL for tasks assessed/performed Overall Cognitive Status: Within Functional Limits for tasks assessed                                 General Comments: Pt A and O x 4        Exercises Other Exercises Other Exercises: standing ther-ex performed including hip abd/add, alt marching, and heel raises. 10 reps performed.1 seated rest break performed    General Comments        Pertinent Vitals/Pain Pain Assessment Pain Assessment: No/denies pain    Home Living  Prior Function            PT Goals (current goals can now be found in the care plan section) Acute Rehab PT Goals Patient Stated Goal: go home PT Goal Formulation: With patient Time For Goal Achievement: 12/04/21 Potential to Achieve Goals: Good Progress towards PT goals: Progressing toward goals    Frequency    Min 2X/week      PT Plan Current plan remains appropriate    Co-evaluation              AM-PAC PT "6 Clicks" Mobility   Outcome  Measure  Help needed turning from your back to your side while in a flat bed without using bedrails?: None Help needed moving from lying on your back to sitting on the side of a flat bed without using bedrails?: None Help needed moving to and from a bed to a chair (including a wheelchair)?: None Help needed standing up from a chair using your arms (e.g., wheelchair or bedside chair)?: None Help needed to walk in hospital room?: A Little Help needed climbing 3-5 steps with a railing? : A Little 6 Click Score: 22    End of Session Equipment Utilized During Treatment: Oxygen Activity Tolerance: Patient limited by fatigue Patient left: in bed (seated at EOB upon patient request) Nurse Communication: Mobility status PT Visit Diagnosis: Muscle weakness (generalized) (M62.81);Difficulty in walking, not elsewhere classified (R26.2)     Time: 0263-7858 PT Time Calculation (min) (ACUTE ONLY): 23 min  Charges:  $Gait Training: 8-22 mins $Therapeutic Exercise: 8-22 mins                     Greggory Stallion, PT, DPT, GCS [phone]    Danielle [name] 11/21/2021, 11:47 AM

## 2021-11-22 ENCOUNTER — Inpatient Hospital Stay: Payer: Medicare Other

## 2021-11-22 LAB — BASIC METABOLIC PANEL
Anion gap: 9 (ref 5–15)
BUN: 26 mg/dL — ABNORMAL HIGH (ref 8–23)
CO2: 31 mmol/L (ref 22–32)
Calcium: 9.2 mg/dL (ref 8.9–10.3)
Chloride: 94 mmol/L — ABNORMAL LOW (ref 98–111)
Creatinine, Ser: 2.38 mg/dL — ABNORMAL HIGH (ref 0.44–1.00)
GFR, Estimated: 22 mL/min — ABNORMAL LOW (ref >60)
Glucose, Bld: 123 mg/dL — ABNORMAL HIGH (ref 70–99)
Potassium: 3.6 mmol/L (ref 3.5–5.1)
Sodium: 134 mmol/L — ABNORMAL LOW (ref 135–145)

## 2021-11-22 LAB — CBC
HCT: 28.9 % — ABNORMAL LOW (ref 36.0–46.0)
Hemoglobin: 9.2 g/dL — ABNORMAL LOW (ref 12.0–15.0)
MCH: 30.2 pg (ref 26.0–34.0)
MCHC: 31.8 g/dL (ref 30.0–36.0)
MCV: 94.8 fL (ref 80.0–100.0)
Platelets: 373 10*3/uL (ref 150–400)
RBC: 3.05 MIL/uL — ABNORMAL LOW (ref 3.87–5.11)
RDW: 15.8 % — ABNORMAL HIGH (ref 11.5–15.5)
WBC: 8.2 10*3/uL (ref 4.0–10.5)
nRBC: 0 % (ref 0.0–0.2)

## 2021-11-22 MED ORDER — OXYMETAZOLINE HCL 0.05 % NA SOLN
1.0000 | Freq: Two times a day (BID) | NASAL | Status: DC | PRN
  Filled 2021-11-22: qty 15

## 2021-11-22 MED ORDER — OXYMETAZOLINE HCL 0.05 % NA SOLN
1.0000 | Freq: Two times a day (BID) | NASAL | Status: DC | PRN
  Administered 2021-11-22: 1 via NASAL
  Filled 2021-11-22 (×2): qty 15

## 2021-11-22 NOTE — Progress Notes (Addendum)
Patient ID: Danielle [name], female   DOB: 1956/04/13, 66 y.o.   MRN: [id number]     [place] [facility] Day(s): 30.   Interval History: Patient seen and examined, no acute events or new complaints overnight. Patient reports feeling the same.  She continued having shortness of breath.  Patient denies chest pain.  Vital signs in last 24 hours: [min-max] current  Temp:  [97.5 F (36.4 C)-98.8 F (37.1 C)] 98.8 F (37.1 C) (02/08 0813) Pulse Rate:  [78-87] 80 (02/08 0813) Resp:  [16-18] 18 (02/08 0813) BP: (101-123)/(71-81) 104/79 (02/08 0813) SpO2:  [93 %-99 %] 93 % (02/08 1054)     Height: 5\' 6"  (167.6 cm) Weight: 60.5 kg BMI (Calculated): 21.54   Physical Exam:  Constitutional: alert, cooperative and no distress  Respiratory: breathing non-labored at rest with nasal cannula.  Shortness of breath on exertion. Cardiovascular: Irregular rhythm Gastrointestinal: soft, non-tender, and non-distended  Labs:  CBC Latest Ref Rng & Units 11/22/2021 11/21/2021 11/18/2021  WBC 4.0 - 10.5 K/uL 8.2 6.0 8.8  Hemoglobin 12.0 - 15.0 g/dL 9.2(L) 8.1(L) 7.9(L)  Hematocrit 36.0 - 46.0 % 28.9(L) 25.9(L) 24.6(L)  Platelets 150 - 400 K/uL 373 335 337   CMP Latest Ref Rng & Units 11/22/2021 11/19/2021 11/18/2021  Glucose 70 - 99 mg/dL 123(H) 85 96  BUN 8 - 23 mg/dL 26(H) 23 23  Creatinine 0.44 - 1.00 mg/dL 2.38(H) 2.15(H) 1.98(H)  Sodium 135 - 145 mmol/L 134(L) 134(L) 138  Potassium 3.5 - 5.1 mmol/L 3.6 4.6 5.4(H)  Chloride 98 - 111 mmol/L 94(L) 97(L) 100  CO2 22 - 32 mmol/L 31 29 32  Calcium 8.9 - 10.3 mg/dL 9.2 8.9 8.8(L)  Total Protein 6.5 - 8.1 g/dL - - -  Total Bilirubin 0.3 - 1.2 mg/dL - - -  Alkaline Phos 38 - 126 U/L - - -  AST 15 - 41 U/L - - -  ALT 0 - 44 U/L - - -    Imaging studies: I personally evaluated the images of the CT scan.  There is moderate size right pleural effusion.   Assessment/Plan:  66 y.o. female with history of an ileostomy and mucous fistula 30 Day Post-Op s/p  ileostomy reversal, complicated by pertinent comorbidities including COPD, atrial fibrillation on anticoagulation, hypertension, chronic kidney disease stage III.   Status post ileostomy reversal -Patient initially with prolonged postoperative ileus that resolved. -Tolerating soft diet well -Passing gas and having bowel movements    Chronic kidney disease stage III -Acute kidney injury after surgery.   --Nephrology managing with diuretics.   Shortness of breath -Due to right pleural effusion.  Will appreciate pulmonology and nephrology for recommendations and management of right pleural effusion.   A. Fib -Intermittent rate control -Currently on therapeutic anticoagulation -Cardiology signed off -Appreciate hospitalist recommendations.   Encourage patient to get out of bed.  Will continue pain management.   Arnold Long, MD

## 2021-11-22 NOTE — Progress Notes (Signed)
Setup MetaNeb with patient. Patient tolerated fairly well, CPEP and Flow settings had to be adjusted for patient comfort, and she was able to tolerate 7.5 min of the goal of 10 minutes.

## 2021-11-22 NOTE — Assessment & Plan Note (Addendum)
Resolved.  Phos 4.6.

## 2021-11-22 NOTE — Progress Notes (Signed)
Progress Note   Patient: Danielle [name] TKZ:[id number] DOB: 02-27-1956 DOA: 10/23/2021     30 DOS: the patient was seen and examined on 11/22/2021   Brief hospital course: Brief hospital course: 66 year old woman with medical history significant for anemia, anxiety, aortic atherosclerosis, aortic stenosis, CKD stage IV, atrial fibrillation, breast cancer with history of chemotherapy and radiation therapy, COPD, depression, chronic diastolic and systolic CHF, GERD, hyperlipidemia, hypertension, lipidemia, small bowel obstruction, vitamin D deficiency.  She was admitted for elective ileostomy takedown.  Hospitalist service consulted for medical management.  Hospital course complicated by prolonged postoperative ileus, anemia requiring transfusion, aspiration pneumonia, AKI, acute on chronic systolic and diastolic CHF, large pleural effusion requiring thoracentesis.    Assessment and Plan: * Ileostomy status ([place]) Status post reversal.  Follow-up with general surgeon.  Aspiration pneumonia of right lower lobe due to gastric secretions (HCC) Completed 9 days Unasyn, no further fever, leukocytosis.   -Continue Chest PT -- Follow pulmonology recommendations  Ileus following gastrointestinal surgery (HCC) Resolved  Anemia of chronic disease Hgb up to 9.2 today No recent evidence of bleeding  -Continue Eliquis - Monitor Hgb  Hypomagnesemia Resolved with replacement.  Mag on 2/3 was 1.3, last mag checked 2/4 was 2.0. -- Repeat mag level with a.m. labs  Hyperphosphatemia- (present on admission) Resolved.  Last phosphorus on 2/5 was 4.6  Acute on chronic combined systolic and diastolic CHF (congestive heart failure) (HCC) Previous EF 35 to 40% in 2019, since recovered, 60-65% this admission with grade I DD. Diuresed with IV Lasix, currently on oral Lasix. Monitor I's and daily weights. Suspect this contributed to her respiratory issues  Hypokalemia- (present on admission) Resolved with  replacement. K today 3.6.  Monitor BMP.  Pleural effusion due to CHF (congestive heart failure) (HCC) Transfudative due to CHF Decompensation  Depression- (present on admission) - Continue Celexa  Normocytic anemia H&H stable.  Monitor CBC periodically  AF (paroxysmal atrial fibrillation) ([place])- (present on admission) Heart rate controlled.  Continue Eliquis, diltiazem, amiodarone.  Appears metoprolol has been discontinued.  HTN (hypertension)- (present on admission) Blood pressure controlled, soft at times - Continue diltiazem - Hold amlodipine -- Metoprolol appears discontinued  Acute kidney injury superimposed on CKD ([place])- (present on admission) Renal function improved with IV hydration.  Weakness PT evaluated and recommends home health PT at discharge.  TOC follow-up.  Acute respiratory failure with hypoxia (HCC) Felt primarily due to decompensated CHF.  Pulmonology following, see their recommendations.  Supplement oxygen as needed to maintain sats above 88%.  At baseline, patient uses 2 L/min as needed  Hyponatremia- (present on admission) Mild, sodium 134 and stable. Monitor BMP        Subjective: Patient awake sitting up in bed when seen, undergoing MetaNeb treatment.  She reports still being very short of breath with minimal exertion but otherwise no acute complaints including fevers chills, wheezing, coughing, chest pain or nausea vomiting  Physical Exam: Vitals:   11/22/21 0433 11/22/21 0813 11/22/21 1049 11/22/21 1054  BP: 123/81 104/79    Pulse: 78 80    Resp:  18    Temp: 98.4 F (36.9 C) 98.8 F (37.1 C)    TempSrc: Oral Oral    SpO2: 96% 99% 97% 93%  Weight:      Height:       General exam: awake, alert, no acute distress HEENT: atraumatic, clear conjunctiva, anicteric sclera, moist mucus membranes, hearing grossly normal  Respiratory system: Diminished throughout, no wheezes, rales or rhonchi, mildly  increased respiratory effort with  conversational dyspnea. Cardiovascular system: normal S1/S2, RRR, no pedal edema.   Gastrointestinal system: soft, NT, ND, no HSM felt, +bowel sounds. Central nervous system: A&O x3. no gross focal neurologic deficits, normal speech Extremities: moves all, no edema, normal tone Skin: dry, intact, normal temperature Psychiatry: normal mood, congruent affect, judgement and insight appear normal   Data Reviewed:  Labs reviewed and notable for sodium 134, chloride 94, glucose 123, BUN 26, creatinine 2.38 from a 2.15, hemoglobin improved from 8.1-9.2  CT chest without contrast -  IMPRESSION: 1. Moderate cardiomegaly, unchanged.  New mild pericardial effusion. 2. Coronary artery calcifications, an indicator of coronary artery disease. 3. Moderate to high-grade centrilobular emphysematous changes. 4. Moderate to high-grade right and mild-to-moderate left pleural effusions, increased from 11/06/2019. 5. Resolution of a superior right upper lobe opacity seen on prior 11/06/2019 CT. 6. 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  Family Communication: None at bedside, will attempt to call this afternoon as time allows  Disposition: Status is: Inpatient  Remains inpatient appropriate because: Severity of illness with ongoing respiratory issues as outlined above.  Discharge pending clearance by pulmonology and improved respiratory status    Planned Discharge Destination: Home with Home Health     Time spent: 35 minutes  Author: Ezekiel Slocumb, DO 11/22/2021 1:48 PM  For on call review [email or web address].

## 2021-11-22 NOTE — Assessment & Plan Note (Signed)
PT evaluated and recommends home health PT at discharge.  TOC follow-up.

## 2021-11-22 NOTE — Progress Notes (Signed)
[facility]  PROGRESS NOTE   Subjective:   Patient sitting up in bed Alert and oriented Continues to complains of shortness of breath with activity Improved respiratory status at rest  Creatinine 3.8   Objective:  Vital signs in last 24 hours:  Temp:  [97.5 F (36.4 C)-98.8 F (37.1 C)] 98.8 F (37.1 C) (02/08 0813) Pulse Rate:  [78-87] 80 (02/08 0813) Resp:  [16-18] 18 (02/08 0813) BP: (101-123)/(71-81) 104/79 (02/08 0813) SpO2:  [93 %-99 %] 93 % (02/08 1054)  Weight change:  Filed Weights   11/17/21 0449 11/18/21 0500 11/20/21 0605  Weight: 62.3 kg 62.4 kg 60.5 kg    Intake/Output: I/O last 3 completed shifts: In: 200 [P.O.:200] Out: 900 [Urine:900]   Intake/Output this shift:  Total I/O In: 240 [P.O.:240] Out: -   Physical Exam: General:  No acute distress  Head:  Normocephalic, atraumatic. Moist oral mucosal membranes  Eyes:  Anicteric  Lungs:   Fine basilar crackles, normal effort  Heart:  S1S2 no rubs  Abdomen:   Soft, nontender, bowel sounds present  Extremities:  No peripheral edema.  Neurologic:  Awake, alert, following commands  Skin:  No lesions       Basic Metabolic Panel: Recent Labs  Lab 11/16/21 0506 11/17/21 0511 11/18/21 0510 11/19/21 0905 11/22/21 0920  NA 141 138 138 134* 134*  K 3.8 4.6 5.4* 4.6 3.6  CL 103 101 100 97* 94*  CO2 31 29 32 29 31  GLUCOSE 84 79 96 85 123*  BUN 22 22 23 23  26*  CREATININE 1.75* 1.87* 1.98* 2.15* 2.38*  CALCIUM 8.5* 8.6* 8.8* 8.9 9.2  MG  --  1.3* 2.0  --   --   PHOS  --  3.8  --  4.6  --      CBC: Recent Labs  Lab 11/16/21 0506 11/17/21 0511 11/18/21 0510 11/21/21 0600 11/22/21 0920  WBC 6.8 7.2 8.8 6.0 8.2  HGB 7.7* 7.9* 7.9* 8.1* 9.2*  HCT 24.3* 24.3* 24.6* 25.9* 28.9*  MCV 95.7 94.9 94.6 94.9 94.8  PLT 361 317 337 335 373      Urinalysis: No results for input(s): COLORURINE, LABSPEC, PHURINE, GLUCOSEU, HGBUR, BILIRUBINUR, KETONESUR, PROTEINUR, UROBILINOGEN,  NITRITE, LEUKOCYTESUR in the last 72 hours.  Invalid input(s): APPERANCEUR    Imaging: CT CHEST WO CONTRAST  Result Date: 11/22/2021 CLINICAL DATA:  Pneumonia. Pleural effusion suspected. Evaluate lungs for fluid. EXAM: CT CHEST WITHOUT CONTRAST TECHNIQUE: Multidetector CT imaging of the chest was performed following the standard protocol without IV contrast. RADIATION DOSE REDUCTION: This exam was performed according to the departmental dose-optimization program which includes automated exposure control, adjustment of the mA and/or kV according to patient size and/or use of iterative reconstruction technique. COMPARISON:  AP chest 11/21/2021; chest two views 11/18/2021; CT chest 11/06/2019 and 11/19/2018 FINDINGS: Cardiovascular: Heart size is moderately enlarged similar to prior. Mild pericardial effusion is new from 11/06/2019. Dense coronary artery calcifications are seen. The ascending aorta measures up to 3.4 cm in caliber, ectatic but not aneurysmal. Moderate to high-grade atherosclerotic calcifications of the thoracic aorta. Mediastinum/Nodes: No axillary, mediastinal, or hilar pathologically enlarged lymph nodes by CT criteria. Visualized thyroid gland is grossly unremarkable. The esophagus follows a normal course of normal caliber. Lungs/Pleura: The central airways are patent. Moderate to high-grade centrilobular emphysematous changes are again seen. Moderate apical paraseptal emphysematous changes. A right upper lobe opacity on the prior CT series 6, image 21 appears to have resolved. Contiguous more inferior right  upper lobe more superior (axial image 56 and sagittal image 51) and inferior (axial image 68 and sagittal image 56) pulmonary opacities extending to the right hilum appears similar to prior with the more inferior opacity that contacts the anterior lung subjectively appearing thinner and with "less fullness" (axial images 62 through 70). Benign partially calcified 3 mm granuloma within  the medial left lower lobe (axial series 3, image 69).a mild anterior left upper lobe subpleural thickening is unchanged (axial series 3, image 70). Moderate to high-grade right and mild to moderate left pleural effusions, increased from 11/06/2019. Upper Abdomen: Unchanged fluid density possible 11 mm cyst within the anterior right hepatic lobe. Musculoskeletal: Mild-to-moderate multilevel degenerative disc changes of the thoracic spine. Minimal dextrocurvature of the midthoracic spine. Old healed anterior left second rib fracture. IMPRESSION:: IMPRESSION: 1. Moderate cardiomegaly, unchanged.  New mild pericardial effusion. 2. Coronary artery calcifications, an indicator of coronary artery disease. 3. Moderate to high-grade centrilobular emphysematous changes. 4. Moderate to high-grade right and mild-to-moderate left pleural effusions, increased from 11/06/2019. 5. Resolution of a superior right upper lobe opacity seen on prior 11/06/2019 CT. 6. 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 Aortic Atherosclerosis (ICD10-I70.0) and Emphysema (ICD10-J43.9). Electronically Signed   By: Yvonne Kendall M.D.   On: 11/22/2021 10:22   DG Chest Port 1 View  Result Date: 11/21/2021 CLINICAL DATA:  Pulmonary edema, COPD EXAM: PORTABLE CHEST 1 VIEW COMPARISON:  11/18/2021 FINDINGS: Stable positioning of a right-sided PICC line. Stable mild cardiomegaly. Aortic atherosclerosis. Chronic scarring in the right perihilar region. Increasing right lower lobe airspace opacity. Small right-sided pleural effusion and trace left pleural effusion. No pneumothorax. IMPRESSION: 1. Worsening right lower lobe airspace opacity, suspicious for pneumonia. 2. Small right and trace left pleural effusions. Electronically Signed   By: Davina Poke D.O.   On: 11/21/2021 13:11     Medications:     amiodarone  200 mg  Oral BID   apixaban  2.5 mg Oral BID   Chlorhexidine Gluconate Cloth  6 each Topical Daily   citalopram  40 mg Oral BH-[id number]   diltiazem  120 mg Oral Daily   famotidine  20 mg Oral Daily   feeding supplement (NEPRO CARB STEADY)  237 mL Oral TID BM   fluticasone furoate-vilanterol  1 puff Inhalation Daily   And   umeclidinium bromide  1 puff Inhalation Daily   gabapentin  300 mg Oral BID   midodrine  10 mg Oral TID WC   multivitamin with minerals  1 tablet Oral Daily   sodium chloride flush  10-40 mL Intracatheter Q12H   torsemide  100 mg Oral BID   Vitamin D (Ergocalciferol)  50,000 Units Oral Q7 days    Assessment/ Plan:     Principal Problem:   Ileostomy status ([place]) Active Problems:   Hyponatremia   Acute respiratory failure with hypoxia (HCC)   Weakness   Acute kidney injury superimposed on CKD (HCC)   HTN (hypertension)   AF (paroxysmal atrial fibrillation) (HCC)   Normocytic anemia   Depression   Pleural effusion due to CHF (congestive heart failure) (HCC)   Hypokalemia   Acute on chronic combined systolic and diastolic CHF (congestive heart failure) (HCC)   Hyperphosphatemia   Hypomagnesemia   Anemia of chronic disease   Ileus following gastrointestinal surgery ([place])   Aspiration pneumonia of right lower lobe due to gastric secretions ([place])  66 year old female with history of hypertension, coronary artery disease, atrial fibrillation, COPD, chronic kidney disease stage IIIb and history of ileostomy now s/p ileostomy reversal. Postoperative course complicated by ileus development and acute kidney injury on the top of chronic kidney disease.  She also developed anemia requiring 2 units of blood transfusion.  Patient is presently hemodynamically stable and is able to ambulate with assistance.   #1: Acute kidney injury on CKD:  Baseline Creatnine 1.25 / GFR  on 10/30/2021. Creatinine slightly elevated today, but stable. Torsemide 100mg  twice daily prescribed by pulmonology.  Improved dyspnea at rest. Will continue to monitor.    #2: Anemia: Patient may have anemia secondary to chronic kidney disease.  Patient has received blood transfusions during this admission.  Hgb slowly improving 9.2.   #3:  Status post elective ileostomy reversal complicated by ileus resulting in short-term TPN and NG tube.  Tolerating meals and passing flatus.    #4: Hypertension/CHF: Continue present antihypertensive medications.  Currently receiving low-dose midodrine with furosemide and spironolactone. BP 104/79    LOS: [facility] 2/8/202311:38 AM

## 2021-11-22 NOTE — Assessment & Plan Note (Addendum)
Resolved with replacement.  Mag on 2/3 was 1.3, last mag checked 2/4 was 2.0. Today Mg 1.7 stable.

## 2021-11-22 NOTE — Assessment & Plan Note (Signed)
Status post reversal.  Follow-up with general surgeon.

## 2021-11-22 NOTE — Assessment & Plan Note (Signed)
H&H stable.  Monitor CBC periodically

## 2021-11-22 NOTE — Progress Notes (Signed)
Pt requested to not do MetaNeb this round, but asked that RT check with her at there next scheduled time to see if she wants to do therapy at that time.

## 2021-11-22 NOTE — Progress Notes (Signed)
PULMONOLOGY         Date: 11/22/2021,   MRN# [id number] Danielle [name] 13-Mar-1956     AdmissionWeight: 56.2 kg                 CurrentWeight: 60.5 kg   Referring physician: Dr Peyton Najjar   CHIEF COMPLAINT:   Increased O2 requirement.   HISTORY OF PRESENT ILLNESS   This is a pleasant 66 yo with complex comorbid history including, COPD, AF, HTN, CKD3, s/p ileostomy reversal with persistent increase O2 req and notable pleural effusion. She is s/p thoracentesis yielding 1L fluid drained from right pleural space.  She is on supplemental O2 and feels improved.    11/14/21 - patient received VEST therapy yesterday with RT and felt that it may be helping. We have repeat CXR scheduled for today. She took few breaths on IS up to 685mL tidal volume. We discussed importance of PT/OT and she agrees to work with them for strength training. Yesterday she had big BM during my visit along with physical therapist. She remains on 3.5L/min and Ive discussed with RN to wean that down as able with goal spO2 >88%.  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     11/16/21- patient is further improved with O2 weaned to 2L/min during my evaluation today.  11/18/21- patient still hospitalized and has increased O2 requirement at 3L/min now.  She cannot walk without desaturation.  I think this is related to fluid balance and renal failure. CXR with bilateral effusions and pulmonary edema.  Renal function is worse this am.  Im gonna increase her blood pressure today and this should help with diuresis and we should continue PT to treat atelectatic segments.   11/19/21- patient is about same as yesterday, clinically however she does seem improved.    11/20/21- Patient seen and examined at bedside. She remains with hypoxemia. Plan has been reviewed with team and I started amio 200 bid PO today, reduced cardizem to  120 stopped metoprolol , started midodrine tid   11/21/21- patient has had no chage from yesterday, I have intensified her diuresis today and icreased midodrine to 10 tid  11/22/21- patient seen and examined at bedside.  She reports feeling better and we have weaned O2 slightly but not good enough. Urine output was better with unmeasured urine +400cc measured overnight. Her X ray report this am reads right lung lower lobe pneumonia.  To me this appears more like fluid with underlying atelectasis,  I have ordered CT chest without contrast to better characterize lung parenchyma. Blood work for this AM is in process. I have reviewed paln with RT today and we will initiate metaneb recruitment maneuvers as well.   PAST MEDICAL HISTORY   Past Medical History:  Diagnosis Date   Anemia    Anxiety    Aortic atherosclerosis ([place])    Aortic valve stenosis 02/10/2018   a.) TTE 02/10/2018: EF 55-60%: mild AS with MPG of 12 mmHg. b.) TTE 04/21/2018: EF 35-40%; mild AS with MPG 8 mmHg. c.) TTE 11/07/2019: EF 50-55%; mild AS with MPG 10 mmHg.   Arthritis    Atrial fibrillation (HCC)    a.) CHA2DS2-VASc = 4 (age, sex, HTN, aortic plaque). b.) Rate/rhythm maintained on oral carvedilol; chronically anticoagulated on dose reduced apixaban. c.)  Attempted deployment of LAA occlusive device on 01/11/2021; parameters failed and procedure aborted.   Breast cancer,  left ([place]) 2000   a.) T2N1M0; ER/PR (+) --> Tx'd with total mastectomy, LN resection, XRT, and chemotherapy   Cancer of right lung ([place]) 07/30/2016   a.) adenocarcinoma; ALK, ROS1, PDL1, BRAF, EGFR all negative.   CKD (chronic kidney disease), stage IV (HCC)    COPD (chronic obstructive pulmonary disease) (HCC)    Dependence on supplemental oxygen    Depression    Diastolic dysfunction 01/60/1093   a.) TTE 02/10/2017: EF 55-60%; G2DD. b.) TTE 04/21/2018: EF 35-40%; mild LA dilation; mod MV regurgitation. c.) TTE 11/07/2019: EF 50-55%; G1DD.   DOE (dyspnea  on exertion)    GERD (gastroesophageal reflux disease)    Heart murmur    History of 2019 novel coronavirus disease (COVID-19) 10/14/2019   HLD (hyperlipidemia)    Hypertension    Long term current use of anticoagulant    a.) apixaban   Lymphedema    Personal history of chemotherapy    Personal history of radiation therapy    SBO (small bowel obstruction) ([place]) 11/05/2020   Vitamin D deficiency      SURGICAL HISTORY   Past Surgical History:  Procedure Laterality Date   Breast Biospy Left    [facility]   BREAST SURGERY     COLONOSCOPY N/A 04/30/2018   Procedure: COLONOSCOPY;  Surgeon: Virgel Manifold, MD;  Location: [facility] ENDOSCOPY;  Service: Endoscopy;  Laterality: N/A;   COLONOSCOPY N/A 07/22/2018   Procedure: COLONOSCOPY;  Surgeon: Virgel Manifold, MD;  Location: [facility] ENDOSCOPY;  Service: Endoscopy;  Laterality: N/A;   COLONOSCOPY WITH PROPOFOL N/A 09/21/2021   Procedure: COLONOSCOPY WITH PROPOFOL;  Surgeon: Benjamine Sprague, DO;  Location: [facility] ENDOSCOPY;  Service: General;  Laterality: N/A;   DILATION AND CURETTAGE OF UTERUS     ELECTROMAGNETIC NAVIGATION BROCHOSCOPY Right 04/11/2016   Procedure: ELECTROMAGNETIC NAVIGATION BRONCHOSCOPY;  Surgeon: Vilinda Boehringer, MD;  Location: [facility] ORS;  Service: Cardiopulmonary;  Laterality: Right;   ESOPHAGOGASTRODUODENOSCOPY N/A 07/22/2018   Procedure: ESOPHAGOGASTRODUODENOSCOPY (EGD);  Surgeon: Virgel Manifold, MD;  Location: [facility] ENDOSCOPY;  Service: Endoscopy;  Laterality: N/A;   ESOPHAGOGASTRODUODENOSCOPY (EGD) WITH PROPOFOL N/A 05/07/2018   Procedure: ESOPHAGOGASTRODUODENOSCOPY (EGD) WITH PROPOFOL;  Surgeon: Lucilla Lame, MD;  Location: [facility] ENDOSCOPY;  Service: Endoscopy;  Laterality: N/A;   ESOPHAGOGASTRODUODENOSCOPY (EGD) WITH PROPOFOL N/A 04/24/2019   Procedure: ESOPHAGOGASTRODUODENOSCOPY (EGD) WITH PROPOFOL;  Surgeon: Jonathon Bellows, MD;  Location: [facility] ENDOSCOPY;  Service: Gastroenterology;  Laterality: N/A;    ESOPHAGOGASTRODUODENOSCOPY (EGD) WITH PROPOFOL N/A 01/12/2020   Procedure: ESOPHAGOGASTRODUODENOSCOPY (EGD) WITH PROPOFOL;  Surgeon: Jonathon Bellows, MD;  Location: [facility] ENDOSCOPY;  Service: Gastroenterology;  Laterality: N/A;   ESOPHAGOGASTRODUODENOSCOPY (EGD) WITH PROPOFOL N/A 04/28/2020   Procedure: ESOPHAGOGASTRODUODENOSCOPY (EGD) WITH PROPOFOL;  Surgeon: Jonathon Bellows, MD;  Location: [facility] ENDOSCOPY;  Service: Gastroenterology;  Laterality: N/A;   EUS N/A 05/07/2019   Procedure: FULL UPPER ENDOSCOPIC ULTRASOUND (EUS) RADIAL;  Surgeon: Jola Schmidt, MD;  Location: [facility] ENDOSCOPY;  Service: Endoscopy;  Laterality: N/A;   ILEOSCOPY N/A 07/22/2018   Procedure: ILEOSCOPY THROUGH STOMA;  Surgeon: Virgel Manifold, MD;  Location: [facility] ENDOSCOPY;  Service: Endoscopy;  Laterality: N/A;   ILEOSTOMY     ILEOSTOMY N/A 09/08/2018   Procedure: ILEOSTOMY REVISION POSSIBLE CREATION;  Surgeon: Herbert Pun, MD;  Location: [facility] ORS;  Service: General;  Laterality: N/A;   ILEOSTOMY CLOSURE N/A 08/15/2018   Procedure: DILATION OF ILEOSTOMY STRICTURE;  Surgeon: Herbert Pun, MD;  Location: [facility] ORS;  Service: General;  Laterality: N/A;   LAPAROTOMY Right 05/04/2018   Procedure: EXPLORATORY  LAPAROTOMY right colectomy right and left ostomy;  Surgeon: Herbert Pun, MD;  Location: [facility] ORS;  Service: General;  Laterality: Right;   LEFT ATRIAL APPENDAGE OCCLUSION N/A 01/11/2021   Procedure: LEFT ATRIAL APPENDAGE OCCLUSION ([place]); ABORTED PROCEDURE WITHOUT DEVICE BEING IMPLANTED; Location: [facility]; Surgeon: Mylinda Latina, MD   LUNG BIOPSY     MASTECTOMY Left    2000, [place]   ROTATOR CUFF REPAIR Right    [place] TAKEDOWN N/A 10/23/2021   Procedure: XI ROBOTIC ASSISTED ILEOSTOMY TAKEDOWN;  Surgeon: Herbert Pun, MD;  Location: [facility] ORS;  Service: General;  Laterality: N/A;  180 minutes for the surgery part please     FAMILY HISTORY   Family  History  Problem Relation Age of Onset   Breast cancer Mother 68   Cancer Mother        Breast    Cirrhosis Father    Breast cancer Paternal Aunt 61   Cancer Maternal Aunt        Breast      SOCIAL HISTORY   Social History   Tobacco Use   Smoking status: Former    Packs/day: 0.50    Years: 20.00    Pack years: 10.00    Types: Cigarettes    Quit date: 07/02/2012    Years since quitting: 9.3   Smokeless tobacco: Current    Types: Snuff   Tobacco comments:    quit 2014  Vaping Use   Vaping Use: Never used  Substance Use Topics   Alcohol use: Yes    Comment: Occasionally beer   Drug use: No     MEDICATIONS    Home Medication:    Current Medication:  Current Facility-Administered Medications:    albuterol (PROVENTIL) (2.5 MG/3ML) 0.083% nebulizer solution 3 mL, 3 mL, Nebulization, Q6H PRN, Windell Moment, Edgardo, MD, 3 mL at 11/14/21 0737   alum & mag hydroxide-simeth (MAALOX/MYLANTA) 200-200-20 MG/5ML suspension 15-30 mL, 15-30 mL, Oral, Q4H PRN, Windell Moment, Edgardo, MD, 30 mL at 11/14/21 1500   amiodarone (PACERONE) tablet 200 mg, 200 mg, Oral, BID, Myishia Kasik, MD, 200 mg at 11/21/21 2121   apixaban (ELIQUIS) tablet 2.5 mg, 2.5 mg, Oral, BID, Dallie Piles, RPH, 2.5 mg at 11/21/21 2120   Chlorhexidine Gluconate Cloth 2 % PADS 6 each, 6 each, Topical, Daily, Herbert Pun, MD, 6 each at 11/21/21 1153   citalopram (CELEXA) tablet 40 mg, 40 mg, Oral, BH-[id number], [name]-Diaz, Reeves Forth, MD, 40 mg at 11/22/21 0602   diltiazem (CARDIZEM CD) 24 hr capsule 120 mg, 120 mg, Oral, Daily, Ryen Heitmeyer, MD, 120 mg at 11/21/21 1007   famotidine (PEPCID) tablet 20 mg, 20 mg, Oral, Daily, [name]-Diaz, Edgardo, MD, 20 mg at 11/21/21 1006   feeding supplement (NEPRO CARB STEADY) liquid 237 mL, 237 mL, Oral, TID BM, [name]-Diaz, Edgardo, MD, Stopped at 11/21/21 2100   fluticasone furoate-vilanterol (BREO ELLIPTA) 100-25 MCG/ACT 1 puff, 1 puff, Inhalation, Daily, 1 puff  at 11/21/21 1010 **AND** umeclidinium bromide (INCRUSE ELLIPTA) 62.5 MCG/ACT 1 puff, 1 puff, Inhalation, Daily, Benita Gutter, RPH, 1 puff at 11/21/21 1010   gabapentin (NEURONTIN) capsule 300 mg, 300 mg, Oral, BID, [name]-Diaz, Edgardo, MD, 300 mg at 11/21/21 2120   HYDROmorphone (DILAUDID) injection 0.5 mg, 0.5 mg, Intravenous, Q2H PRN, Piscoya, Jose, MD, 0.5 mg at 11/09/21 1607   ipratropium-albuterol (DUONEB) 0.5-2.5 (3) MG/3ML nebulizer solution 3 mL, 3 mL, Nebulization, Q6H PRN, Herbert Pun, MD   lip balm (BLISTEX) ointment, , Topical, PRN,  Herbert Pun, MD, Given at 11/06/21 [phone]   menthol-cetylpyridinium (CEPACOL) lozenge 3 mg, 1 lozenge, Oral, PRN, Herbert Pun, MD, 3 mg at 11/07/21 0522   metoprolol tartrate (LOPRESSOR) injection 5 mg, 5 mg, Intravenous, Q6H PRN, Sharion Settler, NP, 5 mg at 10/31/21 1553   midodrine (PROAMATINE) tablet 10 mg, 10 mg, Oral, TID WC, Ayumi Wangerin, MD, 10 mg at 11/21/21 1826   multivitamin with minerals tablet 1 tablet, 1 tablet, Oral, Daily, Herbert Pun, MD, 1 tablet at 11/21/21 1005   ondansetron (ZOFRAN-ODT) disintegrating tablet 4 mg, 4 mg, Oral, Q6H PRN, 4 mg at 11/07/21 1255 **OR** ondansetron (ZOFRAN) injection 4 mg, 4 mg, Intravenous, Q6H PRN, Herbert Pun, MD, 4 mg at 10/29/21 0051   oxyCODONE-acetaminophen (PERCOCET/ROXICET) 5-325 MG per tablet 1-2 tablet, 1-2 tablet, Oral, Q4H PRN, Herbert Pun, MD, 2 tablet at 11/21/21 1824   sodium chloride (OCEAN) 0.65 % nasal spray 1 spray, 1 spray, Each Nare, PRN, Sharion Settler, NP, 1 spray at 11/12/21 1206   sodium chloride flush (NS) 0.9 % injection 10-40 mL, 10-40 mL, Intracatheter, Q12H, [name]-Diaz, Edgardo, MD, 10 mL at 11/21/21 2135   sodium chloride flush (NS) 0.9 % injection 10-40 mL, 10-40 mL, Intracatheter, PRN, Herbert Pun, MD   torsemide (DEMADEX) tablet 100 mg, 100 mg, Oral, BID, Lanney Gins, Dorcas Melito, MD, 100 mg at 11/21/21  1340   Vitamin D (Ergocalciferol) (DRISDOL) capsule 50,000 Units, 50,000 Units, Oral, Q7 days, Murlean Iba, MD, 50,000 Units at 11/20/21 1453    ALLERGIES   Patient has no known allergies.     REVIEW OF SYSTEMS    Review of Systems:  Gen:  Denies  fever, sweats, chills weigh loss  HEENT: Denies blurred vision, double vision, ear pain, eye pain, hearing loss, nose bleeds, sore throat Cardiac:  No dizziness, chest pain or heaviness, chest tightness,edema Resp:   Denies cough or sputum porduction, shortness of breath,wheezing, hemoptysis,  Gi: Denies swallowing difficulty, stomach pain, nausea or vomiting, diarrhea, constipation, bowel incontinence Gu:  Denies bladder incontinence, burning urine Ext:   Denies Joint pain, stiffness or swelling Skin: Denies  skin rash, easy bruising or bleeding or hives Endoc:  Denies polyuria, polydipsia , polyphagia or weight change Psych:   Denies depression, insomnia or hallucinations   Other:  All other systems negative   VS: BP 104/79 (BP Location: Left Leg)    Pulse 80    Temp 98.8 F (37.1 C) (Oral)    Resp 18    Ht $R'5\' 6"'Th$  (1.676 m)    Wt 60.5 kg    SpO2 99%    BMI 21.53 kg/m      PHYSICAL EXAM    GENERAL:NAD, no fevers, chills, no weakness no fatigue HEAD: Normocephalic, atraumatic.  EYES: Pupils equal, round, reactive to light. Extraocular muscles intact. No scleral icterus.  MOUTH: Moist mucosal membrane. Dentition intact. No abscess noted.  EAR, NOSE, THROAT: Clear without exudates. No external lesions.  NECK: Supple. No thyromegaly. No nodules. No JVD.  PULMONARY: mild rhochi worse on right CARDIOVASCULAR: S1 and S2. Regular rate and rhythm. No murmurs, rubs, or gallops. No edema. Pedal pulses 2+ bilaterally.  GASTROINTESTINAL: Soft, nontender, nondistended. No masses. Positive bowel sounds. No hepatosplenomegaly.  MUSCULOSKELETAL: No swelling, clubbing, or edema. Range of motion full in all extremities.  NEUROLOGIC:  Cranial nerves II through XII are intact. No gross focal neurological deficits. Sensation intact. Reflexes intact.  SKIN: No ulceration, lesions, rashes, or cyanosis. Skin warm and dry. Turgor intact.  PSYCHIATRIC: Mood, affect  within normal limits. The patient is awake, alert and oriented x 3. Insight, judgment intact.       IMAGING    CT ABDOMEN PELVIS WO CONTRAST  Result Date: 11/05/2021 CLINICAL DATA:  Sepsis, fever, postoperative, rule out intra-abdominal abscess EXAM: CT ABDOMEN AND PELVIS WITHOUT CONTRAST TECHNIQUE: Multidetector CT imaging of the abdomen and pelvis was performed following the standard protocol without IV contrast. Oral enteric contrast was administered. RADIATION DOSE REDUCTION: This exam was performed according to the departmental dose-optimization program which includes automated exposure control, adjustment of the mA and/or kV according to patient size and/or use of iterative reconstruction technique. COMPARISON:  10/27/2021 FINDINGS: Lower chest: Small right pleural effusion associated atelectasis or consolidation. Three-vessel coronary artery calcifications. Hepatobiliary: No solid liver abnormality is seen. No gallstones, gallbladder wall thickening, or biliary dilatation. Pancreas: Unremarkable. No pancreatic ductal dilatation or surrounding inflammatory changes. Spleen: Normal in size without significant abnormality. Adrenals/Urinary Tract: Adrenal glands are unremarkable. Kidneys are normal, without renal calculi, solid lesion, or hydronephrosis. Bladder is unremarkable. Stomach/Bowel: Stomach is within normal limits. The mid small bowel is diffusely gas and contrast filled, and mildly distended, largest loops measuring 4.1 cm. Status post right hemicolectomy. Descending and sigmoid diverticula. Vascular/Lymphatic: Aortic atherosclerosis. No enlarged abdominal or pelvic lymph nodes. Reproductive: No mass or other significant abnormality. Other: Anasarca. Persistent,  although improved, scattered subcutaneous emphysema about the abdominal wall (series 2, image 38). Unchanged subcutaneous fluid collections of the left and right ventral abdomen, collection on the left measuring 3.4 x 2.2 cm (series 2, image 36), collection on the right measuring 3.5 x 2.0 cm (series 2, image 43). No ascites. Musculoskeletal: No acute or significant osseous findings. IMPRESSION: 1. Unchanged subcutaneous fluid collections of the left and right ventral abdomen, collection on the left measuring 3.4 x 2.2 cm, collection on the right measuring 3.5 x 2.0 cm. These most likely reflect postoperative hematoma or seroma, although presence or absence of infection within this fluid is not established by CT. 2. Status post right hemicolectomy. 3. The mid small bowel is diffusely gas and contrast filled, and mildly distended, largest loops measuring 4.1 cm. Findings are most consistent with postoperative ileus. 4. Right pleural effusion and anasarca. 5. Coronary artery disease. Aortic Atherosclerosis (ICD10-I70.0). Electronically Signed   By: Delanna Ahmadi M.D.   On: 11/05/2021 17:34   CT ABDOMEN PELVIS WO CONTRAST  Result Date: 10/27/2021 CLINICAL DATA:  Postop day 4 from ileostomy reversal. Vomiting. Evaluate for anastomotic leak. EXAM: CT ABDOMEN AND PELVIS WITHOUT CONTRAST TECHNIQUE: Multidetector CT imaging of the abdomen and pelvis was performed following the standard protocol without IV contrast. RADIATION DOSE REDUCTION: This exam was performed according to the departmental dose-optimization program which includes automated exposure control, adjustment of the mA and/or kV according to patient size and/or use of iterative reconstruction technique. COMPARISON:  Abdominopelvic CT 11/05/2020.  Radiographs 10/26/2021. FINDINGS: Study was performed with the patient in the right lateral decubitus position. Rectal contrast was administered. Lower chest: Apparent right middle lobe collapse with a small amount  of adjacent pleural fluid, incompletely visualized. Mild dependent atelectasis in both lower lobes. There is atherosclerosis of the aorta and coronary arteries. Hepatobiliary: Stable low-density liver lesions, likely cysts based on stability. Stable mild biliary dilatation status post cholecystectomy. Pancreas: Unremarkable. No pancreatic ductal dilatation or surrounding inflammatory changes. Spleen: Normal in size without focal abnormality. Adrenals/Urinary Tract: Both adrenal glands appear normal. Both kidneys demonstrate mild perinephric soft tissue stranding, similar to previous study. No evidence of urinary  tract calculus or hydronephrosis. The bladder appears unremarkable for its degree of distention. Stomach/Bowel: No oral contrast was administered. Rectal contrast has passed retrograde through the colon and through the ileocolonic anastomosis at the level of the mid transverse colon status post right hemicolectomy. There is retrograde filling of the distal small bowel. No anastomotic leak. The stomach and small bowel are fluid-filled and mildly distended. No evidence of bowel wall thickening or pneumatosis. Vascular/Lymphatic: There are no enlarged abdominal or pelvic lymph nodes. Diffuse aortic and branch vessel atherosclerosis. Reproductive: Stable appearance of the uterus and adnexa. No adnexal mass. Tubal ligation clips noted. Other: As seen on the postoperative radiographs, there is a large amount of soft tissue emphysema throughout the anterior abdominal wall which extends from the perineum into the presternal soft tissues. There are small focal fluid collections within the right and left anterior abdominal wall, likely related to recent laparoscopic procedure. No free intraperitoneal air, ascites or focal extraluminal fluid collections identified within the peritoneal cavity. Musculoskeletal: No acute or significant osseous findings. Stable chronic mild superior endplate compression fractures at L3  and L5. IMPRESSION: 1. Status post recent ileostomy reversal and ileocolonic anastomosis. The anastomosis is patent, without leak. 2. Nonspecific gastric and proximal small bowel distension without focal transition point, likely related to a postoperative ileus. Recommend radiographic follow-up. Patient may benefit from a nasogastric tube. 3. As seen on postoperative radiographs, there is extensive soft tissue emphysema throughout the abdominal wall which is likely related to the laparoscopic procedure. Soft tissue infection should be excluded clinically. Small fluid collections in the anterior abdominal wall are attributed to the procedure, and no unexpected collections are identified. There is no pneumoperitoneum or abnormal intra-abdominal fluid collection. 4. Atelectasis at both lung bases with apparent partial right middle lobe collapse. 5. Extensive Aortic Atherosclerosis (ICD10-I70.0). Electronically Signed   By: Richardean Sale M.D.   On: 10/27/2021 16:28   DG Chest 1 View  Result Date: 10/26/2021 CLINICAL DATA:  Hypotension EXAM: CHEST  1 VIEW COMPARISON:  11/16/2020, 06/05/2020, PET CT 12/01/2019, CT chest 11/06/2019, 11/06/2019 FINDINGS: Mild cardiomegaly with central vascular congestion. Small pleural effusions and diffuse increased interstitial opacity likely due to edema. Aortic atherosclerosis. No pneumothorax. Irregular opacity with mild distortion and nodularity in the right upper lobe and right hilus similar compared to intermittent previous exams and presumably due to scarring. IMPRESSION: 1. Cardiomegaly with vascular congestion, small pleural effusions and mild diffuse interstitial edema 2. Irregular and nodular opacities in the right hilus and upper lobe present on previous exams and probably due to scarring Electronically Signed   By: Donavan Foil M.D.   On: 10/26/2021 17:54   DG Chest 2 View  Result Date: 11/18/2021 CLINICAL DATA:  Admitted for acute kidney injury, continued fatigue.  Recent pleural effusion on RIGHT. EXAM: CHEST - 2 VIEW COMPARISON:  Chest x-rays dated 11/16/2021, 11/14/2021, 01/20/2020 and 07/31/2020. FINDINGS: Heart size and mediastinal contours are stable. Chronic scarring is again within the RIGHT perihilar lung. Coarse lung markings are seen throughout both lungs. No confluent opacities to suggest superimposed pneumonia. Small bilateral pleural effusions, RIGHT greater than LEFT. No pneumothorax is seen. RIGHT-sided PICC line appears well positioned with tip at the level of the lower SVC/cavoatrial junction. No acute or suspicious osseous abnormality. Extensive aortic atherosclerosis. Mildly distended gas-filled loops of small bowel within the upper abdomen, incompletely imaged. IMPRESSION: 1. Small bilateral pleural effusions, RIGHT greater than LEFT. 2. Coarse lung markings throughout both lungs, compatible with chronic interstitial lung disease/fibrosis. No  evidence of superimposed pneumonia or pulmonary edema. 3. Aortic atherosclerosis. 4. Suspect continued postoperative ileus with mildly distended gas-filled loops of small bowel partially imaged within the upper abdomen. Electronically Signed   By: Franki Cabot M.D.   On: 11/18/2021 12:32   DG Chest 2 View  Result Date: 11/09/2021 CLINICAL DATA:  Shortness of breath.  Mild chest pain.  Smoker. EXAM: CHEST - 2 VIEW COMPARISON:  11/05/2021 FINDINGS: Right PICC tip in the region of the inferior aspect of the superior vena cava without significant change. The nasogastric tube has been removed. Normal sized heart. Stable linear scarring in the right upper lobe. No significant change in a small right pleural effusion and right basilar airspace opacity. Clear left lung. Mildly tortuous and calcified thoracic aorta. No acute bony abnormality. IMPRESSION: Stable small right pleural effusion and right basilar atelectasis and/or pneumonia. Electronically Signed   By: Claudie Revering M.D.   On: 11/09/2021 10:17   DG Abd 1  View  Result Date: 11/03/2021 CLINICAL DATA:  NG tube placement EXAM: ABDOMEN - 1 VIEW COMPARISON:  11/03/2021 11:05 a.m. FINDINGS: Advancement of NG tube with tip and side port below the GE junction. Redemonstrated small bowel dilatation, not significantly changed from the prior exam. The stomach is not distended. The lower abdomen is not included for evaluation. IMPRESSION: Interval advancement of nasogastric tube, with tip and side port overlying the stomach. Overall unchanged small bowel dilatation in the imaged portion of the abdomen. Electronically Signed   By: Merilyn Baba M.D.   On: 11/03/2021 16:01   DG Abd 1 View  Result Date: 10/28/2021 CLINICAL DATA:  NG tube placement EXAM: ABDOMEN - 1 VIEW COMPARISON:  10/26/2021 FINDINGS: Limited radiograph of the lower chest and upper abdomen was obtained for the purposes of enteric tube localization. Enteric tube is seen coursing below the diaphragm with distal tipterminating within the proximal stomach. Side port terminates within the distal esophagus. Similar degree of gaseous distension of large and small bowel within the visualized abdomen. IMPRESSION: Enteric tube with distal tip terminating within the proximal stomach, side port terminating within the distal esophagus. Recommend advancement 7-10 cm. Electronically Signed   By: Davina Poke D.O.   On: 10/28/2021 15:48   DG Abd 1 View  Result Date: 10/26/2021 CLINICAL DATA:  Hypotension history of ileostomy take down EXAM: ABDOMEN - 1 VIEW COMPARISON:  11/05/2020 FINDINGS: Clips in the pelvis. Moderate air distension of the stomach. Air-filled dilated small bowel up to 3.5 cm in the central abdomen. Extensive mottled appearance of the abdominal soft tissues presumably due to soft tissue emphysema related to recent laparoscopic procedure. IMPRESSION: 1. Air distension of stomach and small bowel either due to ileus or possible obstruction. 2. Diffuse mottled appearance of soft tissues felt  consistent with soft tissue emphysema likely due to recent laparoscopic procedure Electronically Signed   By: Donavan Foil M.D.   On: 10/26/2021 17:51   US RENAL  Result Date: 10/25/2021 CLINICAL DATA:  Acute kidney injury EXAM: RENAL / URINARY TRACT ULTRASOUND COMPLETE COMPARISON:  CT 11/05/2020 FINDINGS: Right Kidney: Renal measurements: 8.9 x 4.1 x 4.2 cm = volume: 79.8 mL. Cortex slightly echogenic. No mass or hydronephrosis Left Kidney: Renal measurements: 7.5 x 4.4 x 4 cm = volume: 88.5 mL. Cortex slightly echogenic. No mass or hydronephrosis. Bladder: Appears normal for degree of bladder distention. Other: None. IMPRESSION: Slight increased cortical echogenicity suggesting medical renal disease. No hydronephrosis. Electronically Signed   By: Madie Reno.D.  On: 10/25/2021 16:28   DG Chest Port 1 View  Result Date: 11/21/2021 CLINICAL DATA:  Pulmonary edema, COPD EXAM: PORTABLE CHEST 1 VIEW COMPARISON:  11/18/2021 FINDINGS: Stable positioning of a right-sided PICC line. Stable mild cardiomegaly. Aortic atherosclerosis. Chronic scarring in the right perihilar region. Increasing right lower lobe airspace opacity. Small right-sided pleural effusion and trace left pleural effusion. No pneumothorax. IMPRESSION: 1. Worsening right lower lobe airspace opacity, suspicious for pneumonia. 2. Small right and trace left pleural effusions. Electronically Signed   By: Davina Poke D.O.   On: 11/21/2021 13:11   DG Chest Port 1 View  Result Date: 11/16/2021 CLINICAL DATA:  Shortness of breath EXAM: PORTABLE CHEST 1 VIEW COMPARISON:  Previous studies including the examination of 11/14/2021 FINDINGS: Transverse diameter of heart is increased. Central pulmonary vessels are prominent. Linear densities in the right parahilar region have not changed significantly. Small bilateral pleural effusions are seen, more so on the right side. Crowding of markings in the medial lower lung fields suggest  atelectasis/pneumonia. There is no pneumothorax. Tip of PICC line is seen in superior vena cava close to right atrium. IMPRESSION: Cardiomegaly. Central pulmonary vessels are prominent. Prominence of interstitial markings in the right parahilar region and both lower lung fields may suggest interstitial edema or pneumonia. Small bilateral pleural effusions, more so on the right side. Electronically Signed   By: Elmer Picker M.D.   On: 11/16/2021 08:13   DG Chest Port 1 View  Result Date: 11/14/2021 CLINICAL DATA:  Atelectasis, history of small-bowel obstruction EXAM: PORTABLE CHEST 1 VIEW COMPARISON:  11/13/2021 FINDINGS: No significant change in AP portable chest radiograph, with cardiomegaly and right upper extremity PICC. Diffuse bilateral interstitial pulmonary opacity, small bilateral pleural effusions, and bandlike scarring or atelectasis of the right midlung, unchanged. IMPRESSION: No significant change in AP portable chest radiograph, with cardiomegaly, probable edema, and bilateral pleural effusions. No new airspace opacity. Electronically Signed   By: Delanna Ahmadi M.D.   On: 11/14/2021 09:58   DG Chest Port 1 View  Result Date: 11/13/2021 CLINICAL DATA:  Post right thoracentesis EXAM: PORTABLE CHEST 1 VIEW COMPARISON:  Chest x-ray dated November 12, 2021 FINDINGS: Cardiac and mediastinal contours are unchanged. Linear nodular opacity of the right upper lobe, unchanged from prior exam. No new parenchymal opacity. Interval decreased size of small right pleural effusion. No evidence pneumothorax. IMPRESSION: No evidence of pneumothorax. Electronically Signed   By: Yetta Glassman M.D.   On: 11/13/2021 11:46   DG Chest Port 1 View  Result Date: 11/12/2021 CLINICAL DATA:  Acute respiratory failure with hypoxia. EXAM: PORTABLE CHEST 1 VIEW COMPARISON:  November 09, 2021 FINDINGS: Right upper extremity PICC with tip near the superior cavoatrial junction. The heart size and mediastinal contours  are within normal limits. Stable linear scarring in the right upper lobe. Slightly increased small right pleural effusion with adjacent airspace opacities. Increased conspicuity of the irregular nodular areas of consolidation in the right mid lung. The visualized skeletal structures are unremarkable. IMPRESSION: 1. Slightly increased small right pleural effusion with adjacent airspace opacities. 2. Increased conspicuity of the irregular nodular areas of consolidation in the right mid lung. Electronically Signed   By: Dahlia Bailiff M.D.   On: 11/12/2021 08:55   DG Chest Port 1 View  Result Date: 11/05/2021 CLINICAL DATA:  Fever. EXAM: PORTABLE CHEST 1 VIEW COMPARISON:  Chest radiograph 10/26/2021. FINDINGS: Right upper extremity PICC line tip projects over the superior vena cava. Enteric tube is present. The side  port is at the level of the distal esophagus. Stable enlarged cardiac and mediastinal contours. Similar-appearing distortion and consolidation within the mid right lung. Bibasilar atelectasis, right-greater-than-left. Probable small right pleural effusion. No pneumothorax. IMPRESSION: Enteric tube side-port at the distal esophagus, recommend advancement. Right upper extremity PICC line tip projects over the superior vena cava. Similar-appearing irregular and nodular areas of consolidation within the right mid lung. These results will be called to the ordering clinician or representative by the Radiologist Assistant, and communication documented in the PACS or [employer]. Electronically Signed   By: Lovey Newcomer M.D.   On: 11/05/2021 08:04   DG Abd 2 Views  Result Date: 11/06/2021 CLINICAL DATA:  Abdominal pain EXAM: ABDOMEN - 2 VIEW COMPARISON:  Abdominal x-ray 11/03/2021 FINDINGS: Enteric tube identified with the tip in the lower esophagus. Mildly distended loops of small bowel are again seen throughout the abdomen measuring up to 3.3 cm in diameter. No free air identified on the decubitus  view. No suspicious calcifications identified. IMPRESSION: 1. Enteric tube tip is in the lower esophagus, advancement recommended. 2. Mildly distended loops of bowel throughout the abdomen which could be seen with ileus or obstruction. Continued follow-up recommended. Electronically Signed   By: Ofilia Neas M.D.   On: 11/06/2021 10:42   DG Abd 2 Views  Result Date: 11/03/2021 CLINICAL DATA:  Abdominal distention EXAM: ABDOMEN - 2 VIEW COMPARISON:  Previous studies including the examination of 11/02/2021 FINDINGS: There is interval worsening of small bowel dilation measuring up to 4.9 cm in diameter. Tip enteric tube is noted at the gastroesophageal junction pointing cephalad. There is no distention of stomach. There is interval passage of contrast from the colon. There is evidence of previous tubal ligation. IMPRESSION: There is interval increase in small bowel dilation suggesting high-grade small bowel obstruction. Tip of enteric tube is at the gastroesophageal junction pointing cephalad. NG tube should be advanced 5-10 cm to place the tip and side port within the stomach. Electronically Signed   By: Elmer Picker M.D.   On: 11/03/2021 12:36   DG Abd 2 Views  Result Date: 10/31/2021 CLINICAL DATA:  Ileus following gastrointestinal surgery, colostomy takedown 10/23/2021 EXAM: ABDOMEN - 2 VIEW COMPARISON:  10/28/2021 FINDINGS: Nasogastric tube in proximal stomach. Persistent dilatation of small bowel loops in the abdomen without wall thickening. Small amount retained contrast in colon. Levoconvex lumbar scoliosis. Foci of soft tissue gas at RIGHT lateral abdominal wall consistent with preceding surgery. Atelectasis at RIGHT lung base. IMPRESSION: Persistent small bowel ileus. Electronically Signed   By: Lavonia Dana M.D.   On: 10/31/2021 10:59   DG Abd Portable 1V-Small Bowel Obstruction Protocol-initial, 8 hr delay  Result Date: 11/02/2021 CLINICAL DATA:  8 hour follow-up small-bowel delay  EXAM: PORTABLE ABDOMEN - 1 VIEW COMPARISON:  Film from the previous day. FINDINGS: Gastric catheter is noted within the stomach. Contrast material administered now lies within the dilated small bowel as well as within the colon. These changes are consistent with a partial small bowel obstruction. No free air is noted. IMPRESSION: Passage of contrast into the colon consistent with a partial small bowel obstruction. Electronically Signed   By: Inez Catalina M.D.   On: 11/02/2021 03:13   DG Abd Portable 1V  Result Date: 11/01/2021 CLINICAL DATA:  Enteric catheter placement EXAM: PORTABLE ABDOMEN - 1 VIEW COMPARISON:  10/31/2021 FINDINGS: Frontal view of the lower chest and upper abdomen demonstrates enteric catheter tip and side port projecting over the gastric fundus. Right-sided  PICC tip overlies atriocaval junction. Cardiac silhouette is enlarged. Small right pleural effusion. Stable vascular congestion. Stable distended gas-filled loops of small bowel, likely ileus given recent surgery. IMPRESSION: 1. Enteric catheter projecting over gastric fundus. 2. Stable small bowel dilatation consistent with ileus. Electronically Signed   By: Randa Ngo M.D.   On: 11/01/2021 16:02   ECHOCARDIOGRAM COMPLETE  Result Date: 10/27/2021    ECHOCARDIOGRAM REPORT   Patient Name:   Danielle [name] Date of Exam: 10/27/2021 Medical Rec #:  [id number]       Height:       66.0 in Accession #:    [id number]      Weight:       124.0 lb Date of Birth:  Jun 26, 1956       BSA:          53.632 m Patient Age:    4 years        BP:           144/84 mmHg Patient Gender: F               HR:           95 bpm. Exam Location:  [facility] Procedure: 2D Echo, Cardiac Doppler and Color Doppler Indications:     Elevated troponin  History:         Patient has prior history of Echocardiogram examinations, most                  recent 11/07/2019. COPD, Arrythmias:Atrial Fibrillation,                  Signs/Symptoms:Murmur; Risk Factors:Hypertension.   Sonographer:     Sherrie Sport Referring Phys:  [place] Diagnosing Phys: Donnelly Angelica IMPRESSIONS  1. Left ventricular ejection fraction, by estimation, is 60 to 65%. The left ventricle has normal function. The left ventricle has no regional wall motion abnormalities. Left ventricular diastolic parameters are consistent with Grade I diastolic dysfunction (impaired relaxation).  2. Right ventricular systolic function is normal. The right ventricular size is mildly enlarged.  3. Right atrial size was mildly dilated.  4. The mitral valve is normal in structure. Moderate mitral valve regurgitation. No evidence of mitral stenosis.  5. Tricuspid valve regurgitation is moderate.  6. The aortic valve is calcified. Aortic valve regurgitation is not visualized. Mild aortic valve stenosis.  7. The inferior vena cava is normal in size with greater than 50% respiratory variability, suggesting right atrial pressure of 3 mmHg. Conclusion(s)/Recommendation(s): CALCIFIED AORTIC VALVE WITH MILD AS. VMax 2.1 m/s. Dimensionless index 0.4. FINDINGS  Left Ventricle: Left ventricular ejection fraction, by estimation, is 60 to 65%. The left ventricle has normal function. The left ventricle has no regional wall motion abnormalities. The left ventricular internal cavity size was normal in size. There is  no left ventricular hypertrophy. Left ventricular diastolic parameters are consistent with Grade I diastolic dysfunction (impaired relaxation). Right Ventricle: The right ventricular size is mildly enlarged. No increase in right ventricular wall thickness. Right ventricular systolic function is normal. Left Atrium: Left atrial size was normal in size. Right Atrium: Right atrial size was mildly dilated. Pericardium: There is no evidence of pericardial effusion. Mitral Valve: The mitral valve is normal in structure. Moderate mitral valve regurgitation. No evidence of mitral valve stenosis. MV peak gradient, 3.1 mmHg. The mean mitral  valve gradient is 1.0 mmHg. Tricuspid Valve: The tricuspid valve is normal in structure. Tricuspid valve regurgitation is moderate . No  evidence of tricuspid stenosis. Aortic Valve: The aortic valve is calcified. Aortic valve regurgitation is not visualized. Mild aortic stenosis is present. Aortic valve mean gradient measures 9.4 mmHg. Aortic valve peak gradient measures 16.8 mmHg. Aortic valve area, by VTI measures 1.21 cm. Pulmonic Valve: The pulmonic valve was not well visualized. Pulmonic valve regurgitation is not visualized. No evidence of pulmonic stenosis. Aorta: The aortic root is normal in size and structure. Venous: The inferior vena cava is normal in size with greater than 50% respiratory variability, suggesting right atrial pressure of 3 mmHg. IAS/Shunts: No atrial level shunt detected by color flow Doppler.  LEFT VENTRICLE PLAX 2D LVIDd:         4.80 cm   Diastology LVIDs:         3.00 cm   LV e' medial:    5.87 cm/s LV PW:         1.10 cm   LV E/e' medial:  13.9 LV IVS:        0.75 cm   LV e' lateral:   12.40 cm/s LVOT diam:     2.00 cm   LV E/e' lateral: 6.6 LV SV:         45 LV SV Index:   28 LVOT Area:     3.14 cm  RIGHT VENTRICLE RV Basal diam:  3.80 cm RV S prime:     12.20 cm/s TAPSE (M-mode): 1.8 cm LEFT ATRIUM             Index        RIGHT ATRIUM           Index LA diam:        3.20 cm 1.96 cm/m   RA Area:     19.30 cm LA Vol (A2C):   58.8 ml 36.03 ml/m  RA Volume:   63.90 ml  39.15 ml/m LA Vol (A4C):   38.7 ml 23.71 ml/m LA Biplane Vol: 49.1 ml 30.08 ml/m  AORTIC VALVE                     PULMONIC VALVE AV Area (Vmax):    1.04 cm      PV Vmax:        0.59 m/s AV Area (Vmean):   0.91 cm      PV Vmean:       35.200 cm/s AV Area (VTI):     1.21 cm      PV VTI:         0.109 m AV Vmax:           205.20 cm/s   PV Peak grad:   1.4 mmHg AV Vmean:          140.200 cm/s  PV Mean grad:   1.0 mmHg AV VTI:            0.373 m       RVOT Peak grad: 4 mmHg AV Peak Grad:      16.8 mmHg AV Mean  Grad:      9.4 mmHg LVOT Vmax:         67.90 cm/s LVOT Vmean:        40.500 cm/s LVOT VTI:          0.144 m LVOT/AV VTI ratio: 0.39  AORTA Ao Root diam: 3.17 cm MITRAL VALVE               TRICUSPID VALVE MV Area (PHT): 4.86 cm  TR Peak grad:   52.4 mmHg MV Area VTI:   3.01 cm    TR Vmax:        362.00 cm/s MV Peak grad:  3.1 mmHg MV Mean grad:  1.0 mmHg    SHUNTS MV Vmax:       0.88 m/s    Systemic VTI:  0.14 m MV Vmean:      55.0 cm/s   Systemic Diam: 2.00 cm MV Decel Time: 156 msec    Pulmonic VTI:  0.157 m MV E velocity: 81.40 cm/s MV A velocity: 51.40 cm/s MV E/A ratio:  1.58 Donnelly Angelica Electronically signed by Donnelly Angelica Signature Date/Time: 10/27/2021/4:07:10 PM    Final    [place] EKG SITE RITE  Result Date: 11/01/2021 If Site Rite image not attached, placement could not be confirmed due to current cardiac rhythm.  US THORACENTESIS ASP PLEURAL SPACE W/IMG GUIDE  Result Date: 11/13/2021 INDICATION: Right pleural effusion with shortness breath request received for diagnostic and therapeutic thoracentesis. EXAM: ULTRASOUND GUIDED RIGHT THORACENTESIS MEDICATIONS: Local 1% lidocaine only. COMPLICATIONS: None immediate. PROCEDURE: An ultrasound guided thoracentesis was thoroughly discussed with the patient and questions answered. The benefits, risks, alternatives and complications were also discussed. The patient understands and wishes to proceed with the procedure. Written consent was obtained. Ultrasound was performed to localize and mark an adequate pocket of fluid in the right chest. The area was then prepped and draped in the normal sterile fashion. 1% Lidocaine was used for local anesthesia. Under ultrasound guidance a 19 gauge, 7-cm, Yueh catheter was introduced. Thoracentesis was performed. The catheter was removed and a dressing applied. FINDINGS: A total of approximately 1 L of amber colored fluid was removed. Samples were sent to the laboratory as requested by the clinical team. IMPRESSION:  Successful ultrasound guided right thoracentesis yielding 1 L of pleural fluid. This exam was performed by Tsosie Billing PA-C, and was supervised and interpreted by Dr. Denna Haggard. Electronically Signed   By: Albin Felling M.D.   On: 11/13/2021 12:05       ASSESSMENT/PLAN   Acute on chronic hypoxemic respiratory failure   - Due to moderate right pleural effusion with associated right lower lobe complete atelectasis   -s/p thoracentesis - 1 L - aspirated -                 -patient would benefit from diuresis - nephrology on case appreciate collaboration- ive upgraded loop to torsemide 100 from lasix 40 bid   Moderate pleural effusion   S/p aspiration - fluid pattern studies with chronic effusion lymphocyte predominant transudate.     Suspect transudative fluid due to CKD - Noted nephrology on case - appreciate input   Right lower lobe atelectasis   - patient would benefit from recruitment maneuvers    - will order RT Metaneb with albuterol BID    - Incentive spirometry and flutter valve PRN  CKD   - likely culprit with fluid overload contributing to pulmonary and gut edema   Atrial fibrillation - chronic - potential for rapid ventricular response due to albuterol/ipratropium therapy     Thank you for allowing me to participate in the care of this patient.    Patient/Family are satisfied with care plan and all questions have been answered.  This document was prepared using Dragon voice recognition software and may include unintentional dictation errors.     Ottie Glazier, M.D.  Division of Pulmonary & [place]         .  fa

## 2021-11-22 NOTE — Procedures (Signed)
PROCEDURE SUMMARY:  Successful US guided right diagnostic and therapeutic thoracentesis. Yielded 550 mL of amber fluid. Pt tolerated procedure well. No immediate complications.  Specimen was sent for labs. CXR ordered.  EBL < 5 mL  Docia Barrier PA-C 11/22/2021 4:34 PM

## 2021-11-22 NOTE — Assessment & Plan Note (Signed)
Mild, sodium 134 and stable. Monitor BMP

## 2021-11-22 NOTE — Progress Notes (Signed)
Mobility Specialist - Progress Note   11/22/21 1400  Mobility  Activity Ambulated with assistance in hallway;Stood at bedside;Dangled on edge of bed  Level of Assistance Modified independent, requires aide device or extra time  Assistive Device Standard walker  Distance Ambulated (ft) 200 ft  Activity Response Tolerated well  $Mobility charge 1 Mobility     Pre-mobility: 97 HR, 85% SpO2 During mobility: 110-118 HR, 88-92% SpO2 Post-mobility: 101 HR, 85% SPO2  Pt lying in bed upon arrival using 2L. Pt sat EOB with independence. Pt given 4L for session. Pt ambulated 218ft ModI taking 4 breaks d/t SOB. Pt returned to bed on 3L d/t SPO2 of 85%. Pt noted mild SOB after ambulation. Pt left in bed with needs in reach.  Kathee Delton Mobility Specialist 11/22/21, 2:43 PM

## 2021-11-23 LAB — BASIC METABOLIC PANEL
Anion gap: 13 (ref 5–15)
BUN: 31 mg/dL — ABNORMAL HIGH (ref 8–23)
CO2: 31 mmol/L (ref 22–32)
Calcium: 9.1 mg/dL (ref 8.9–10.3)
Chloride: 90 mmol/L — ABNORMAL LOW (ref 98–111)
Creatinine, Ser: 2.33 mg/dL — ABNORMAL HIGH (ref 0.44–1.00)
GFR, Estimated: 22 mL/min — ABNORMAL LOW (ref >60)
Glucose, Bld: 101 mg/dL — ABNORMAL HIGH (ref 70–99)
Potassium: 3.9 mmol/L (ref 3.5–5.1)
Sodium: 134 mmol/L — ABNORMAL LOW (ref 135–145)

## 2021-11-23 LAB — CORTISOL-AM, BLOOD: Cortisol - AM: 6.4 ug/dL — ABNORMAL LOW (ref 6.7–22.6)

## 2021-11-23 LAB — MAGNESIUM: Magnesium: 1.7 mg/dL (ref 1.7–2.4)

## 2021-11-23 LAB — PHOSPHORUS: Phosphorus: 4.6 mg/dL (ref 2.5–4.6)

## 2021-11-23 MED ORDER — METOPROLOL TARTRATE 25 MG PO TABS
12.5000 mg | ORAL_TABLET | Freq: Two times a day (BID) | ORAL | Status: DC
  Administered 2021-11-23 – 2021-11-24 (×3): 12.5 mg via ORAL
  Filled 2021-11-23 (×3): qty 1

## 2021-11-23 MED ORDER — FLUTICASONE PROPIONATE 50 MCG/ACT NA SUSP
2.0000 | Freq: Every day | NASAL | Status: DC
  Administered 2021-11-23 – 2021-11-24 (×2): 2 via NASAL
  Filled 2021-11-23: qty 16

## 2021-11-23 MED ORDER — LORATADINE 10 MG PO TABS
10.0000 mg | ORAL_TABLET | Freq: Every day | ORAL | Status: DC
  Administered 2021-11-23 – 2021-11-24 (×2): 10 mg via ORAL
  Filled 2021-11-23 (×2): qty 1

## 2021-11-23 NOTE — Progress Notes (Signed)
Progress Note   Patient: Danielle [name] DOB: 14-Jun-1956 DOA: 10/23/2021     31 DOS: the patient was seen and examined on 11/23/2021   Brief hospital course: Brief hospital course: 66 year old woman with medical history significant for anemia, anxiety, aortic atherosclerosis, aortic stenosis, CKD stage IV, atrial fibrillation, breast cancer with history of chemotherapy and radiation therapy, COPD, depression, chronic diastolic and systolic CHF, GERD, hyperlipidemia, hypertension, lipidemia, small bowel obstruction, vitamin D deficiency.  She was admitted for elective ileostomy takedown.  Hospitalist service consulted for medical management.  Hospital course complicated by prolonged postoperative ileus, anemia requiring transfusion, aspiration pneumonia, AKI, acute on chronic systolic and diastolic CHF, large pleural effusion requiring thoracentesis.    Assessment and Plan: * Ileostomy status ([place]) Status post reversal.  Follow-up with general surgeon.  Aspiration pneumonia of right lower lobe due to gastric secretions (HCC) Completed 9 days Unasyn, no further fever, leukocytosis.   -Continue Chest PT -- Follow pulmonology recommendations  Ileus following gastrointestinal surgery (HCC) Resolved  Anemia of chronic disease Hgb up to 9.2 - stable and improved. No recent evidence of bleeding  -Continue Eliquis - Monitor Hgb  Hypomagnesemia Resolved with replacement.  Mag on 2/3 was 1.3, last mag checked 2/4 was 2.0. Today Mg 1.7 stable.  Hyperphosphatemia- (present on admission) Resolved.  Phos 4.6.  Acute on chronic combined systolic and diastolic CHF (congestive heart failure) (HCC) Previous EF 35 to 40% in 2019, since recovered, 60-65% this admission with grade I DD. Diuresed with IV Lasix. Now on PO torsemide 100 mg BID. Monitor I's and daily weights. Suspect this contributed to her respiratory issues  Hypokalemia- (present on admission) Resolved with  replacement. K today 3.9.  Monitor BMP.  Pleural effusion due to CHF (congestive heart failure) (HCC) Transfudative due to CHF Decompensation  Depression- (present on admission) - Continue Celexa  Normocytic anemia H&H stable.  Monitor CBC periodically  AF (paroxysmal atrial fibrillation) ([place])- (present on admission) Heart rate controlled.  Continue Eliquis, diltiazem, amiodarone, low dose metoprolol  HTN (hypertension)- (present on admission) Blood pressure controlled, soft at times - Continue diltiazem, low dose metop, torsemide - Hold amlodipine -- on midodrine due to orthostasis  Acute kidney injury superimposed on CKD ([place])- (present on admission) Renal function improved with IV hydration.  Weakness PT evaluated and recommends home health PT at discharge.  TOC follow-up.  Acute respiratory failure with hypoxia (HCC) Felt primarily due to decompensated CHF.  Pulmonology following, see their recommendations.  Supplement oxygen as needed to maintain sats above 88%.  At baseline, patient uses 2 L/min as needed  Hyponatremia- (present on admission) Mild, sodium 134 and stable. Monitor BMP        Subjective: Patient awake sitting up in bed when seen today.  She reports overall feeling well.  Says her breathing has improved she is less dyspneic with exertion and.  Reports she had thoracentesis yesterday and tolerated it well.  Not having any pain at the site.  She does report some mild sinus congestion and headache this morning but no other acute complaints.  Physical Exam: Vitals:   11/23/21 0423 11/23/21 0426 11/23/21 0500 11/23/21 0802  BP: (!) 151/80 (!) 147/79  107/70  Pulse: 80 77  73  Resp: 20   17  Temp: 98.2 F (36.8 C)   98.4 F (36.9 C)  TempSrc: Oral   Oral  SpO2: 100% 100%  100%  Weight:   56.6 kg   Height:  General exam: awake, alert, no acute distress HEENT: atraumatic, clear conjunctiva, anicteric sclera, moist mucus membranes, hearing  grossly normal  Respiratory system: CTAB but diminished throughout, no wheezes, rales or rhonchi, normal respiratory effort at rest, on 2 L/min  O2. Cardiovascular system: normal S1/S2, RRR, no pedal edema.   Gastrointestinal system: soft, nontender, bowel sounds present Central nervous system: A&O x3. no gross focal neurologic deficits, normal speech Extremities: moves all, no edema, normal tone Skin: dry, intact, normal temperature Psychiatry: normal mood, congruent affect, judgement and insight appear normal   Data Reviewed:  Labs reviewed and notable for sodium 134, chloride 90, glucose 101, BUN 31, creatinine 2.33 stable  Family Communication: None at bedside on rounds, will attempt to call this afternoon as time allows  Disposition: Status is: Inpatient  Remains inpatient appropriate because: Severity of illness monitoring respiratory status.  Discharge per primary team General surgery, pending clearance by pulmonology    Planned Discharge Destination: Home with Home Health     Time spent: 35 minutes  Author: Ezekiel Slocumb, DO 11/23/2021 2:04 PM  For on call review [email or web address].

## 2021-11-23 NOTE — Progress Notes (Signed)
Patient refused metaneb and treatment at this time.

## 2021-11-23 NOTE — Progress Notes (Signed)
Patient ID: Danielle [name], female   DOB: Jul 21, 1956, 66 y.o.   MRN: [id number]     [facility] Day(s): 31.   Interval History: Patient seen and examined, no acute events or new complaints overnight. Patient reports feeling well.  She denies shortness of breath on rest.  She has not had any significant physical activity after thoracentesis yesterday.  She had thoracentesis yesterday with drainage of 550 mL of fluid.  X-ray today shows significant improvement of pleural effusion.  Continue tolerating diet.  Vital signs in last 24 hours: [min-max] current  Temp:  [97.5 F (36.4 C)-98.4 F (36.9 C)] 98.4 F (36.9 C) (02/09 0802) Pulse Rate:  [64-80] 73 (02/09 0802) Resp:  [17-20] 17 (02/09 0802) BP: (100-151)/(70-113) 107/70 (02/09 0802) SpO2:  [93 %-100 %] 100 % (02/09 0802) Weight:  [56.6 kg] 56.6 kg (02/09 0500)     Height: 5\' 6"  (167.6 cm) Weight: 56.6 kg BMI (Calculated): 21.54   Physical Exam:  Constitutional: alert, cooperative and no distress  Respiratory: breathing non-labored at rest  Cardiovascular: regular rate and sinus rhythm  Gastrointestinal: soft, non-tender, and non-distended  Labs:  CBC Latest Ref Rng & Units 11/22/2021 11/21/2021 11/18/2021  WBC 4.0 - 10.5 K/uL 8.2 6.0 8.8  Hemoglobin 12.0 - 15.0 g/dL 9.2(L) 8.1(L) 7.9(L)  Hematocrit 36.0 - 46.0 % 28.9(L) 25.9(L) 24.6(L)  Platelets 150 - 400 K/uL 373 335 337   CMP Latest Ref Rng & Units 11/23/2021 11/22/2021 11/19/2021  Glucose 70 - 99 mg/dL 101(H) 123(H) 85  BUN 8 - 23 mg/dL 31(H) 26(H) 23  Creatinine 0.44 - 1.00 mg/dL 2.33(H) 2.38(H) 2.15(H)  Sodium 135 - 145 mmol/L 134(L) 134(L) 134(L)  Potassium 3.5 - 5.1 mmol/L 3.9 3.6 4.6  Chloride 98 - 111 mmol/L 90(L) 94(L) 97(L)  CO2 22 - 32 mmol/L 31 31 29   Calcium 8.9 - 10.3 mg/dL 9.1 9.2 8.9  Total Protein 6.5 - 8.1 g/dL - - -  Total Bilirubin 0.3 - 1.2 mg/dL - - -  Alkaline Phos 38 - 126 U/L - - -  AST 15 - 41 U/L - - -  ALT 0 - 44 U/L - - -     Imaging studies: No new pertinent imaging studies   Assessment/Plan:  66 y.o. female with history of an ileostomy and mucous fistula 31 Day Post-Op s/p ileostomy reversal, complicated by pertinent comorbidities including COPD, atrial fibrillation on anticoagulation, hypertension, chronic kidney disease stage III.   Status post ileostomy reversal -Patient initially with prolonged postoperative ileus that resolved. -Tolerating soft diet well -Passing gas and having bowel movements    Chronic kidney disease stage III -Acute kidney injury after surgery.   --Nephrology managing with diuretics.   Shortness of breath -Due to right pleural effusion.  Appreciate pulmonology and nephrology for recommendations and management of right pleural effusion. -Patient had thoracentesis yesterday with improvement of pleural effusion on chest x-ray today. -We will see how she tolerates activity.  If she tolerates activity better we will coordinate to discharge her tomorrow.    A. Fib -Improved with rate and rhythm control with amiodarone and metoprolol -Currently on therapeutic anticoagulation -Cardiology signed off -Appreciate hospitalist recommendations.   Encourage patient to get out of bed.  Will continue pain management.   Arnold Long, MD

## 2021-11-23 NOTE — Care Management Important Message (Signed)
Important Message  Patient Details  Name: Danielle [name] MRN: [id number] [name] of Birth: Sep 29, 1956   Medicare Important Message Given:  Yes     [name] 11/23/2021, 12:58 PM

## 2021-11-23 NOTE — Progress Notes (Signed)
[facility]  PROGRESS NOTE   Subjective:   Patient seen sitting up in bed, alert and oriented States respiratory status has improved with increased dose of diuretics.  States she is able to get up  and down from bedside commode with less shortness of breath   Objective:  Vital signs in last 24 hours:  Temp:  [97.5 F (36.4 C)-98.4 F (36.9 C)] 98.4 F (36.9 C) (02/09 0802) Pulse Rate:  [64-80] 73 (02/09 0802) Resp:  [17-20] 17 (02/09 0802) BP: (100-151)/(70-113) 107/70 (02/09 0802) SpO2:  [98 %-100 %] 100 % (02/09 0802) Weight:  [56.6 kg] 56.6 kg (02/09 0500)  Weight change:  Filed Weights   11/18/21 0500 11/20/21 0605 11/23/21 0500  Weight: 62.4 kg 60.5 kg 56.6 kg    Intake/Output: I/O last 3 completed shifts: In: 600 [P.O.:600] Out: 400 [Urine:400]   Intake/Output this shift:  Total I/O In: 240 [P.O.:240] Out: -   Physical Exam: General:  No acute distress  Head:  Normocephalic, atraumatic. Moist oral mucosal membranes  Eyes:  Anicteric  Lungs:  Clear but diminished in bases, normal effort  Heart:  S1S2 no rubs  Abdomen:   Soft, nontender, bowel sounds present  Extremities:  No peripheral edema.  Neurologic:  Awake, alert, following commands  Skin:  No lesions       Basic Metabolic Panel: Recent Labs  Lab 11/17/21 0511 11/18/21 0510 11/19/21 0905 11/22/21 0920 11/23/21 0625  NA 138 138 134* 134* 134*  K 4.6 5.4* 4.6 3.6 3.9  CL 101 100 97* 94* 90*  CO2 29 32 29 31 31   GLUCOSE 79 96 85 123* 101*  BUN 22 23 23  26* 31*  CREATININE 1.87* 1.98* 2.15* 2.38* 2.33*  CALCIUM 8.6* 8.8* 8.9 9.2 9.1  MG 1.3* 2.0  --   --  1.7  PHOS 3.8  --  4.6  --  4.6     CBC: Recent Labs  Lab 11/17/21 0511 11/18/21 0510 11/21/21 0600 11/22/21 0920  WBC 7.2 8.8 6.0 8.2  HGB 7.9* 7.9* 8.1* 9.2*  HCT 24.3* 24.6* 25.9* 28.9*  MCV 94.9 94.6 94.9 94.8  PLT 317 337 335 373      Urinalysis: No results for input(s): COLORURINE, LABSPEC, PHURINE,  GLUCOSEU, HGBUR, BILIRUBINUR, KETONESUR, PROTEINUR, UROBILINOGEN, NITRITE, LEUKOCYTESUR in the last 72 hours.  Invalid input(s): APPERANCEUR    Imaging: CT CHEST WO CONTRAST  Result Date: 11/22/2021 CLINICAL DATA:  Pneumonia. Pleural effusion suspected. Evaluate lungs for fluid. EXAM: CT CHEST WITHOUT CONTRAST TECHNIQUE: Multidetector CT imaging of the chest was performed following the standard protocol without IV contrast. RADIATION DOSE REDUCTION: This exam was performed according to the departmental dose-optimization program which includes automated exposure control, adjustment of the mA and/or kV according to patient size and/or use of iterative reconstruction technique. COMPARISON:  AP chest 11/21/2021; chest two views 11/18/2021; CT chest 11/06/2019 and 11/19/2018 FINDINGS: Cardiovascular: Heart size is moderately enlarged similar to prior. Mild pericardial effusion is new from 11/06/2019. Dense coronary artery calcifications are seen. The ascending aorta measures up to 3.4 cm in caliber, ectatic but not aneurysmal. Moderate to high-grade atherosclerotic calcifications of the thoracic aorta. Mediastinum/Nodes: No axillary, mediastinal, or hilar pathologically enlarged lymph nodes by CT criteria. Visualized thyroid gland is grossly unremarkable. The esophagus follows a normal course of normal caliber. Lungs/Pleura: The central airways are patent. Moderate to high-grade centrilobular emphysematous changes are again seen. Moderate apical paraseptal emphysematous changes. A right upper lobe opacity on the prior CT series  6, image 21 appears to have resolved. Contiguous more inferior right upper lobe more superior (axial image 56 and sagittal image 51) and inferior (axial image 68 and sagittal image 56) pulmonary opacities extending to the right hilum appears similar to prior with the more inferior opacity that contacts the anterior lung subjectively appearing thinner and with "less fullness" (axial images  62 through 70). Benign partially calcified 3 mm granuloma within the medial left lower lobe (axial series 3, image 69).a mild anterior left upper lobe subpleural thickening is unchanged (axial series 3, image 70). Moderate to high-grade right and mild to moderate left pleural effusions, increased from 11/06/2019. Upper Abdomen: Unchanged fluid density possible 11 mm cyst within the anterior right hepatic lobe. Musculoskeletal: Mild-to-moderate multilevel degenerative disc changes of the thoracic spine. Minimal dextrocurvature of the midthoracic spine. Old healed anterior left second rib fracture. IMPRESSION:: IMPRESSION: 1. Moderate cardiomegaly, unchanged.  New mild pericardial effusion. 2. Coronary artery calcifications, an indicator of coronary artery disease. 3. Moderate to high-grade centrilobular emphysematous changes. 4. Moderate to high-grade right and mild-to-moderate left pleural effusions, increased from 11/06/2019. 5. Resolution of a superior right upper lobe opacity seen on prior 11/06/2019 CT. 6. 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 Aortic Atherosclerosis (ICD10-I70.0) and Emphysema (ICD10-J43.9). Electronically Signed   By: Yvonne Kendall M.D.   On: 11/22/2021 10:22   DG Chest Port 1 View  Result Date: 11/22/2021 CLINICAL DATA:  Status post right thoracentesis. EXAM: PORTABLE CHEST 1 VIEW COMPARISON:  Earlier today. FINDINGS: Interval decrease in right basilar pleural fluid with no pneumothorax seen. Resolved atelectasis at the right lung base. Stable minimal left pleural effusion. Stable enlarged cardiac silhouette, tortuous and calcified thoracic aorta and linear scarring in the right upper lung zone. Right PICC tip in the region of the superior cavoatrial junction/inferior aspect of the superior vena cava. Mild increase in mild linear atelectasis at the medial left  lung base. No acute bony abnormality. IMPRESSION: 1. Decreased right pleural fluid without pneumothorax following thoracentesis. 2. Resolved right basilar atelectasis. 3. Mild increase in mild left lower lobe atelectasis. 4. Minimal left pleural effusion, small residual right pleural effusion and stable right upper lung zone scarring. Electronically Signed   By: Claudie Revering M.D.   On: 11/22/2021 16:42   DG Chest Port 1 View  Result Date: 11/21/2021 CLINICAL DATA:  Pulmonary edema, COPD EXAM: PORTABLE CHEST 1 VIEW COMPARISON:  11/18/2021 FINDINGS: Stable positioning of a right-sided PICC line. Stable mild cardiomegaly. Aortic atherosclerosis. Chronic scarring in the right perihilar region. Increasing right lower lobe airspace opacity. Small right-sided pleural effusion and trace left pleural effusion. No pneumothorax. IMPRESSION: 1. Worsening right lower lobe airspace opacity, suspicious for pneumonia. 2. Small right and trace left pleural effusions. Electronically Signed   By: Davina Poke D.O.   On: 11/21/2021 13:11   US THORACENTESIS ASP PLEURAL SPACE W/IMG GUIDE  Result Date: 11/23/2021 INDICATION: Patient with history of CKD, COPD, admitted s/p ileostomy reversal now with persistent right pleural effusion. Request made for therapeutic right thoracentesis. EXAM: ULTRASOUND GUIDED RIGHT THORACENTESIS MEDICATIONS: 10 mL 1% lidocaine COMPLICATIONS: None immediate. PROCEDURE: An ultrasound guided thoracentesis was thoroughly discussed with the patient and questions answered. The benefits, risks, alternatives and complications were also discussed. The patient understands and wishes to proceed with the procedure. Written consent was obtained. Ultrasound was performed to localize and mark an adequate pocket of fluid in  the right chest. The area was then prepped and draped in the normal sterile fashion. 1% Lidocaine was used for local anesthesia. Under ultrasound guidance a 6 Fr Safe-T-Centesis catheter was  introduced. Thoracentesis was performed. The catheter was removed and a dressing applied. FINDINGS: A total of approximately 550 mL of amber fluid was removed. IMPRESSION: Successful ultrasound guided right therapeutic thoracentesis yielding 550 mL of pleural fluid. Read by: Brynda Greathouse PA-C Electronically Signed   By: Albin Felling M.D.   On: 11/23/2021 07:43     Medications:     amiodarone  200 mg Oral BID   apixaban  2.5 mg Oral BID   Chlorhexidine Gluconate Cloth  6 each Topical Daily   citalopram  40 mg Oral BH-[id number]   famotidine  20 mg Oral Daily   feeding supplement (NEPRO CARB STEADY)  237 mL Oral TID BM   fluticasone  2 spray Each Nare Daily   fluticasone furoate-vilanterol  1 puff Inhalation Daily   And   umeclidinium bromide  1 puff Inhalation Daily   loratadine  10 mg Oral Daily   metoprolol tartrate  12.5 mg Oral BID   midodrine  10 mg Oral TID WC   multivitamin with minerals  1 tablet Oral Daily   sodium chloride flush  10-40 mL Intracatheter Q12H   torsemide  100 mg Oral BID   Vitamin D (Ergocalciferol)  50,000 Units Oral Q7 days    Assessment/ Plan:     Principal Problem:   Ileostomy status ([place]) Active Problems:   Hyponatremia   Acute respiratory failure with hypoxia (HCC)   Weakness   Acute kidney injury superimposed on CKD (HCC)   HTN (hypertension)   AF (paroxysmal atrial fibrillation) (HCC)   Normocytic anemia   Depression   Pleural effusion due to CHF (congestive heart failure) (HCC)   Hypokalemia   Acute on chronic combined systolic and diastolic CHF (congestive heart failure) (HCC)   Hyperphosphatemia   Hypomagnesemia   Anemia of chronic disease   Ileus following gastrointestinal surgery (HCC)   Aspiration pneumonia of right lower lobe due to gastric secretions ([place])  66 year old female with history of hypertension, coronary artery disease, atrial fibrillation, COPD, chronic kidney disease stage IIIb and history of ileostomy now s/p ileostomy  reversal. Postoperative course complicated by ileus development and acute kidney injury on the top of chronic kidney disease.  She also developed anemia requiring 2 units of blood transfusion.  Patient is presently hemodynamically stable and is able to ambulate with assistance.   #1: Acute kidney injury on CKD:  Baseline Creatnine 1.25 / GFR  on 10/30/2021.  Creatinine stable today.  Improvements noted with increased diuretics.  We will recommend discharging on torsemide 20 mg twice daily.  We will schedule patient a follow-up appointment with our office for further management.    #2: Anemia: Patient may have anemia secondary to chronic kidney disease.  Patient has received blood transfusions during this admission.     #3:  Status post elective ileostomy reversal complicated by ileus resulting in short-term TPN and NG tube.  Tolerating meals and pain managed   #4: Hypertension/CHF: Currently receiving low-dose midodrine with Torsemide.    LOS: [facility] 2/9/202312:15 PM

## 2021-11-23 NOTE — Progress Notes (Signed)
Physical Therapy Treatment Patient Details Name: Danielle [name] MRN: [id number] DOB: 1955-12-07 Today's Date: 11/23/2021   History of Present Illness 66 y/o female s/p iliostomy take down 1/09.    PT Comments    Pt is making good progress towards goals with ability to ambulate around entire RN station this date. All  mobility performed on 4L of O2 with sats at 95% and HR controlled. Pt is close to baseline level and is very hopeful of discharge soon. Will continue to progress as able.  Recommendations for follow up therapy are one component of a multi-disciplinary discharge planning process, led by the attending physician.  Recommendations may be updated based on patient status, additional functional criteria and insurance authorization.  Follow Up Recommendations  Home health PT     Assistance Recommended at Discharge PRN  Patient can return home with the following A little help with walking and/or transfers;A little help with bathing/dressing/bathroom   Equipment Recommendations  Rolling walker (2 wheels)    Recommendations for Other Services       Precautions / Restrictions Precautions Precautions: Fall Restrictions Weight Bearing Restrictions: No     Mobility  Bed Mobility Overal bed mobility: Modified Independent             General bed mobility comments: safe technique with ease of transition to EOB    Transfers Overall transfer level: Modified independent Equipment used: None Transfers: Sit to/from Stand Sit to Stand: Modified independent (Device/Increase time)           General transfer comment: uses B hands for transfer. Once standing, holds onto RW or counter    Ambulation/Gait Ambulation/Gait assistance: Modified independent (Device/Increase time) Gait Distance (Feet): 220 Feet Assistive device: Rolling walker (2 wheels) Gait Pattern/deviations: Step-through pattern       General Gait Details: ambulated entire RN station and in room  distances. RW used with reciprocal gait pattern. Slow gait speed. All mobility performed on 4L of O2 with sats at 95% and HR at 70bpm throughout. 1 Standing rest break required   Stairs             Wheelchair Mobility    Modified Rankin (Stroke Patients Only)       Balance Overall balance assessment: Needs assistance Sitting-balance support: No upper extremity supported Sitting balance-Leahy Scale: Good     Standing balance support: Bilateral upper extremity supported Standing balance-Leahy Scale: Fair Standing balance comment: Reliant on the walker in both dynamic and static standing.  No LOBs but far from her baseline w/o AD                            Cognition Arousal/Alertness: Awake/alert Behavior During Therapy: WFL for tasks assessed/performed Overall Cognitive Status: Within Functional Limits for tasks assessed                                 General Comments: Pt A and O x 4        Exercises      General Comments        Pertinent Vitals/Pain Pain Assessment Pain Assessment: No/denies pain    Home Living                          Prior Function            PT Goals (current goals can  now be found in the care plan section) Acute Rehab PT Goals Patient Stated Goal: go home PT Goal Formulation: With patient Time For Goal Achievement: 12/04/21 Potential to Achieve Goals: Good Progress towards PT goals: Progressing toward goals    Frequency    Min 2X/week      PT Plan Current plan remains appropriate    Co-evaluation              AM-PAC PT "6 Clicks" Mobility   Outcome Measure  Help needed turning from your back to your side while in a flat bed without using bedrails?: None Help needed moving from lying on your back to sitting on the side of a flat bed without using bedrails?: None Help needed moving to and from a bed to a chair (including a wheelchair)?: None Help needed standing up from a  chair using your arms (e.g., wheelchair or bedside chair)?: None Help needed to walk in hospital room?: None Help needed climbing 3-5 steps with a railing? : A Little 6 Click Score: 23    End of Session         PT Visit Diagnosis: Muscle weakness (generalized) (M62.81);Difficulty in walking, not elsewhere classified (R26.2)     Time: 7793-9030 PT Time Calculation (min) (ACUTE ONLY): 24 min  Charges:  $Gait Training: 23-37 mins                     Greggory Stallion, PT, DPT, GCS [phone]    Danielle [name] 11/23/2021, 2:29 PM

## 2021-11-23 NOTE — Progress Notes (Signed)
PULMONOLOGY         Date: 11/23/2021,   MRN# [id number] Danielle [name] 1956/05/13     AdmissionWeight: 56.2 kg                 CurrentWeight: 56.6 kg   Referring physician: Dr Peyton Najjar   CHIEF COMPLAINT:   Increased O2 requirement.   HISTORY OF PRESENT ILLNESS   This is a pleasant 66 yo with complex comorbid history including, COPD, AF, HTN, CKD3, s/p ileostomy reversal with persistent increase O2 req and notable pleural effusion. She is s/p thoracentesis yielding 1L fluid drained from right pleural space.  She is on supplemental O2 and feels improved.    11/14/21 - patient received VEST therapy yesterday with RT and felt that it may be helping. We have repeat CXR scheduled for today. She took few breaths on IS up to 617mL tidal volume. We discussed importance of PT/OT and she agrees to work with them for strength training. Yesterday she had big BM during my visit along with physical therapist. She remains on 3.5L/min and Ive discussed with RN to wean that down as able with goal spO2 >88%.  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     11/16/21- patient is further improved with O2 weaned to 2L/min during my evaluation today.  11/18/21- patient still hospitalized and has increased O2 requirement at 3L/min now.  She cannot walk without desaturation.  I think this is related to fluid balance and renal failure. CXR with bilateral effusions and pulmonary edema.  Renal function is worse this am.  Im gonna increase her blood pressure today and this should help with diuresis and we should continue PT to treat atelectatic segments.   11/19/21- patient is about same as yesterday, clinically however she does seem improved.    11/20/21- Patient seen and examined at bedside. She remains with hypoxemia. Plan has been reviewed with team and I started amio 200 bid PO today, reduced cardizem to  120 stopped metoprolol , started midodrine tid   11/21/21- patient has had no chage from yesterday, I have intensified her diuresis today and icreased midodrine to 10 tid  11/22/21- patient seen and examined at bedside.  She reports feeling better and we have weaned O2 slightly but not good enough. Urine output was better with unmeasured urine +400cc measured overnight. Her X ray report this am reads right lung lower lobe pneumonia.  To me this appears more like fluid with underlying atelectasis,  I have ordered CT chest without contrast to better characterize lung parenchyma. Blood work for this AM is in process. I have reviewed paln with RT today and we will initiate metaneb recruitment maneuvers as well.   11/23/21- patient improved from previous. S/p 550 amber colored fluid removed. I reviewed CT chest there is right upper lobe mass which is stable from past scan in Jan 2022.  I reviewed imaging with radiologist to confirm stability. Further previous fluid cytology is negative for atypia and coupled with stable imaging suggests non-cancer etiology most likely CHF/CKD overlap.  She does have additional plan for eval with medical oncology Dr B on outpatient in few wks time. This morning labs show stable renal function but azotemia and hemoconcentration is forming suggestive of contraction so we will have to be cautious with ongoing diuresis, appreciate renal team for collaboration. Her BP still soft on midodrine 10 tid, on cardizem  120 the HR is well controlled in 70s, plan to stop this today and initiate metop succinate 12.5 bid with goal to allow a better blood pressure while still controlling AF in conjuction with amiodarone 200 bid.  AM cortisol level ordered due to previous glucocorticoid use. Gabapentin dcd today due to fluid retention as side effect. She does not need Duoneb or Albuterol nebulizer therapy this will likely only worsen her AF without added benefit ater use of Breo Ellipta and Incruse which  she has scheduled.   PAST MEDICAL HISTORY   Past Medical History:  Diagnosis Date   Anemia    Anxiety    Aortic atherosclerosis ([place])    Aortic valve stenosis 02/10/2018   a.) TTE 02/10/2018: EF 55-60%: mild AS with MPG of 12 mmHg. b.) TTE 04/21/2018: EF 35-40%; mild AS with MPG 8 mmHg. c.) TTE 11/07/2019: EF 50-55%; mild AS with MPG 10 mmHg.   Arthritis    Atrial fibrillation (HCC)    a.) CHA2DS2-VASc = 4 (age, sex, HTN, aortic plaque). b.) Rate/rhythm maintained on oral carvedilol; chronically anticoagulated on dose reduced apixaban. c.)  Attempted deployment of LAA occlusive device on 01/11/2021; parameters failed and procedure aborted.   Breast cancer, left ([place]) 2000   a.) T2N1M0; ER/PR (+) --> Tx'd with total mastectomy, LN resection, XRT, and chemotherapy   Cancer of right lung ([employer]) 07/30/2016   a.) adenocarcinoma; ALK, ROS1, PDL1, BRAF, EGFR all negative.   CKD (chronic kidney disease), stage IV (HCC)    COPD (chronic obstructive pulmonary disease) (HCC)    Dependence on supplemental oxygen    Depression    Diastolic dysfunction 01/75/1025   a.) TTE 02/10/2017: EF 55-60%; G2DD. b.) TTE 04/21/2018: EF 35-40%; mild LA dilation; mod MV regurgitation. c.) TTE 11/07/2019: EF 50-55%; G1DD.   DOE (dyspnea on exertion)    GERD (gastroesophageal reflux disease)    Heart murmur    History of 2019 novel coronavirus disease (COVID-19) 10/14/2019   HLD (hyperlipidemia)    Hypertension    Long term current use of anticoagulant    a.) apixaban   Lymphedema    Personal history of chemotherapy    Personal history of radiation therapy    SBO (small bowel obstruction) ([place]) 11/05/2020   Vitamin D deficiency      SURGICAL HISTORY   Past Surgical History:  Procedure Laterality Date   Breast Biospy Left    [facility]   BREAST SURGERY     COLONOSCOPY N/A 04/30/2018   Procedure: COLONOSCOPY;  Surgeon: Virgel Manifold, MD;  Location: [facility] ENDOSCOPY;  Service: Endoscopy;  Laterality:  N/A;   COLONOSCOPY N/A 07/22/2018   Procedure: COLONOSCOPY;  Surgeon: Virgel Manifold, MD;  Location: [facility] ENDOSCOPY;  Service: Endoscopy;  Laterality: N/A;   COLONOSCOPY WITH PROPOFOL N/A 09/21/2021   Procedure: COLONOSCOPY WITH PROPOFOL;  Surgeon: Benjamine Sprague, DO;  Location: [facility] ENDOSCOPY;  Service: General;  Laterality: N/A;   DILATION AND CURETTAGE OF UTERUS     ELECTROMAGNETIC NAVIGATION BROCHOSCOPY Right 04/11/2016   Procedure: ELECTROMAGNETIC NAVIGATION BRONCHOSCOPY;  Surgeon: Vilinda Boehringer, MD;  Location: [facility] ORS;  Service: Cardiopulmonary;  Laterality: Right;   ESOPHAGOGASTRODUODENOSCOPY N/A 07/22/2018   Procedure: ESOPHAGOGASTRODUODENOSCOPY (EGD);  Surgeon: Virgel Manifold, MD;  Location[facility] ENDOSCOPY;  Service: Endoscopy;  Laterality: N/A;   ESOPHAGOGASTRODUODENOSCOPY (EGD) WITH PROPOFOL N/A 05/07/2018   Procedure: ESOPHAGOGASTRODUODENOSCOPY (EGD) WITH PROPOFOL;  Surgeon: Lucilla Lame, MD;  Location: [facility] ENDOSCOPY;  Service: Endoscopy;  Laterality: N/A;   ESOPHAGOGASTRODUODENOSCOPY (EGD) WITH PROPOFOL N/A  04/24/2019   Procedure: ESOPHAGOGASTRODUODENOSCOPY (EGD) WITH PROPOFOL;  Surgeon: Jonathon Bellows, MD;  Location: [facility] ENDOSCOPY;  Service: Gastroenterology;  Laterality: N/A;   ESOPHAGOGASTRODUODENOSCOPY (EGD) WITH PROPOFOL N/A 01/12/2020   Procedure: ESOPHAGOGASTRODUODENOSCOPY (EGD) WITH PROPOFOL;  Surgeon: Jonathon Bellows, MD;  Location: [facility] ENDOSCOPY;  Service: Gastroenterology;  Laterality: N/A;   ESOPHAGOGASTRODUODENOSCOPY (EGD) WITH PROPOFOL N/A 04/28/2020   Procedure: ESOPHAGOGASTRODUODENOSCOPY (EGD) WITH PROPOFOL;  Surgeon: Jonathon Bellows, MD;  Location: [facility] ENDOSCOPY;  Service: Gastroenterology;  Laterality: N/A;   EUS N/A 05/07/2019   Procedure: FULL UPPER ENDOSCOPIC ULTRASOUND (EUS) RADIAL;  Surgeon: Jola Schmidt, MD;  Location: [facility] ENDOSCOPY;  Service: Endoscopy;  Laterality: N/A;   ILEOSCOPY N/A 07/22/2018   Procedure: ILEOSCOPY THROUGH STOMA;  Surgeon:  Virgel Manifold, MD;  Location: [facility] ENDOSCOPY;  Service: Endoscopy;  Laterality: N/A;   ILEOSTOMY     ILEOSTOMY N/A 09/08/2018   Procedure: ILEOSTOMY REVISION POSSIBLE CREATION;  Surgeon: Herbert Pun, MD;  Location: [facility] ORS;  Service: General;  Laterality: N/A;   ILEOSTOMY CLOSURE N/A 08/15/2018   Procedure: DILATION OF ILEOSTOMY STRICTURE;  Surgeon: Herbert Pun, MD;  Location: [facility] ORS;  Service: General;  Laterality: N/A;   LAPAROTOMY Right 05/04/2018   Procedure: EXPLORATORY LAPAROTOMY right colectomy right and left ostomy;  Surgeon: Herbert Pun, MD;  Location: [facility] ORS;  Service: General;  Laterality: Right;   LEFT ATRIAL APPENDAGE OCCLUSION N/A 01/11/2021   Procedure: LEFT ATRIAL APPENDAGE OCCLUSION ([place]); ABORTED PROCEDURE WITHOUT DEVICE BEING IMPLANTED; Location: [facility]; Surgeon: Mylinda Latina, MD   LUNG BIOPSY     MASTECTOMY Left    2000, [place] Right    [place]   XI ROBOTIC ASSISTED COLOSTOMY TAKEDOWN N/A 10/23/2021   Procedure: XI ROBOTIC ASSISTED ILEOSTOMY TAKEDOWN;  Surgeon: Herbert Pun, MD;  Location: [facility] ORS;  Service: General;  Laterality: N/A;  180 minutes for the surgery part please     FAMILY HISTORY   Family History  Problem Relation Age of Onset   Breast cancer Mother 24   Cancer Mother        Breast    Cirrhosis Father    Breast cancer Paternal Aunt 58   Cancer Maternal Aunt        Breast      SOCIAL HISTORY   Social History   Tobacco Use   Smoking status: Former    Packs/day: 0.50    Years: 20.00    Pack years: 10.00    Types: Cigarettes    Quit date: 07/02/2012    Years since quitting: 9.4   Smokeless tobacco: Current    Types: Snuff   Tobacco comments:    quit 2014  Vaping Use   Vaping Use: Never used  Substance Use Topics   Alcohol use: Yes    Comment: Occasionally beer   Drug use: No     MEDICATIONS    Home Medication:    Current Medication:  Current  Facility-Administered Medications:    albuterol (PROVENTIL) (2.5 MG/3ML) 0.083% nebulizer solution 3 mL, 3 mL, Nebulization, Q6H PRN, [name]-Diaz, Edgardo, MD, 3 mL at 11/14/21 0737   alum & mag hydroxide-simeth (MAALOX/MYLANTA) 200-200-20 MG/5ML suspension 15-30 mL, 15-30 mL, Oral, Q4H PRN, [name]-Diaz, Edgardo, MD, 30 mL at 11/14/21 1500   amiodarone (PACERONE) tablet 200 mg, 200 mg, Oral, BID, Jenni Thew, MD, 200 mg at 11/22/21 2117   apixaban (ELIQUIS) tablet 2.5 mg, 2.5 mg, Oral, BID, Dallie Piles, RPH, 2.5 mg at 11/22/21 2116   Chlorhexidine Gluconate  Cloth 2 % PADS 6 each, 6 each, Topical, Daily, Herbert Pun, MD, 6 each at 11/22/21 1209   citalopram (CELEXA) tablet 40 mg, 40 mg, Oral, BH-[id number], [name]-Diaz, Reeves Forth, MD, 40 mg at 11/23/21 0523   diltiazem (CARDIZEM CD) 24 hr capsule 120 mg, 120 mg, Oral, Daily, Lanney Gins, Cheridan Kibler, MD, 120 mg at 11/22/21 0910   famotidine (PEPCID) tablet 20 mg, 20 mg, Oral, Daily, [name]-Diaz, Edgardo, MD, 20 mg at 11/22/21 0911   feeding supplement (NEPRO CARB STEADY) liquid 237 mL, 237 mL, Oral, TID BM, [name]-Diaz, Edgardo, MD, Last Rate: 0 mL/hr at 11/21/21 2100, 237 mL at 11/22/21 1000   fluticasone furoate-vilanterol (BREO ELLIPTA) 100-25 MCG/ACT 1 puff, 1 puff, Inhalation, Daily, 1 puff at 11/22/21 0912 **AND** umeclidinium bromide (INCRUSE ELLIPTA) 62.5 MCG/ACT 1 puff, 1 puff, Inhalation, Daily, Benita Gutter, RPH, 1 puff at 11/22/21 0911   gabapentin (NEURONTIN) capsule 300 mg, 300 mg, Oral, BID, [name]-Diaz, Edgardo, MD, 300 mg at 11/22/21 2116   HYDROmorphone (DILAUDID) injection 0.5 mg, 0.5 mg, Intravenous, Q2H PRN, Piscoya, Jose, MD, 0.5 mg at 11/09/21 1607   ipratropium-albuterol (DUONEB) 0.5-2.5 (3) MG/3ML nebulizer solution 3 mL, 3 mL, Nebulization, Q6H PRN, Herbert Pun, MD   lip balm (BLISTEX) ointment, , Topical, PRN, Herbert Pun, MD, Given at 11/06/21 0905   menthol-cetylpyridinium (CEPACOL) lozenge 3  mg, 1 lozenge, Oral, PRN, Herbert Pun, MD, 3 mg at 11/07/21 0522   metoprolol tartrate (LOPRESSOR) injection 5 mg, 5 mg, Intravenous, Q6H PRN, Sharion Settler, NP, 5 mg at 10/31/21 1553   midodrine (PROAMATINE) tablet 10 mg, 10 mg, Oral, TID WC, Keylin Ferryman, MD, 10 mg at 11/22/21 1843   multivitamin with minerals tablet 1 tablet, 1 tablet, Oral, Daily, [name]-Diaz, Reeves Forth, MD, 1 tablet at 11/22/21 0911   ondansetron (ZOFRAN-ODT) disintegrating tablet 4 mg, 4 mg, Oral, Q6H PRN, 4 mg at 11/07/21 1255 **OR** ondansetron (ZOFRAN) injection 4 mg, 4 mg, Intravenous, Q6H PRN, Herbert Pun, MD, 4 mg at 10/29/21 0051   oxyCODONE-acetaminophen (PERCOCET/ROXICET) 5-325 MG per tablet 1-2 tablet, 1-2 tablet, Oral, Q4H PRN, Herbert Pun, MD, 1 tablet at 11/23/21 0523   oxymetazoline (AFRIN) 0.05 % nasal spray 1 spray, 1 spray, Each Nare, BID PRN, Foust, Katy L, NP, 1 spray at 11/22/21 2115   sodium chloride (OCEAN) 0.65 % nasal spray 1 spray, 1 spray, Each Nare, PRN, Sharion Settler, NP, 1 spray at 11/12/21 1206   sodium chloride flush (NS) 0.9 % injection 10-40 mL, 10-40 mL, Intracatheter, Q12H, [name]-Diaz, Edgardo, MD, 20 mL at 11/22/21 2118   sodium chloride flush (NS) 0.9 % injection 10-40 mL, 10-40 mL, Intracatheter, PRN, Herbert Pun, MD   torsemide (DEMADEX) tablet 100 mg, 100 mg, Oral, BID, Lanney Gins, Teniola Tseng, MD, 100 mg at 11/22/21 1843   Vitamin D (Ergocalciferol) (DRISDOL) capsule 50,000 Units, 50,000 Units, Oral, Q7 days, Murlean Iba, MD, 50,000 Units at 11/20/21 1453    ALLERGIES   Patient has no known allergies.     REVIEW OF SYSTEMS    Review of Systems:  Gen:  Denies  fever, sweats, chills weigh loss  HEENT: Denies blurred vision, double vision, ear pain, eye pain, hearing loss, nose bleeds, sore throat Cardiac:  No dizziness, chest pain or heaviness, chest tightness,edema Resp:   Denies cough or sputum porduction, shortness of  breath,wheezing, hemoptysis,  Gi: Denies swallowing difficulty, stomach pain, nausea or vomiting, diarrhea, constipation, bowel incontinence Gu:  Denies bladder incontinence, burning urine Ext:   Denies Joint pain, stiffness or swelling Skin:  Denies  skin rash, easy bruising or bleeding or hives Endoc:  Denies polyuria, polydipsia , polyphagia or weight change Psych:   Denies depression, insomnia or hallucinations   Other:  All other systems negative   VS: BP (!) 147/79    Pulse 77    Temp 98.2 F (36.8 C) (Oral)    Resp 20    Ht $R'5\' 6"'Go$  (1.676 m)    Wt 56.6 kg    SpO2 100%    BMI 20.14 kg/m      PHYSICAL EXAM    GENERAL:NAD, no fevers, chills, no weakness no fatigue HEAD: Normocephalic, atraumatic.  EYES: Pupils equal, round, reactive to light. Extraocular muscles intact. No scleral icterus.  MOUTH: Moist mucosal membrane. Dentition intact. No abscess noted.  EAR, NOSE, THROAT: Clear without exudates. No external lesions.  NECK: Supple. No thyromegaly. No nodules. No JVD.  PULMONARY: mild rhochi worse on right CARDIOVASCULAR: S1 and S2. Regular rate and rhythm. No murmurs, rubs, or gallops. No edema. Pedal pulses 2+ bilaterally.  GASTROINTESTINAL: Soft, nontender, nondistended. No masses. Positive bowel sounds. No hepatosplenomegaly.  MUSCULOSKELETAL: No swelling, clubbing, or edema. Range of motion full in all extremities.  NEUROLOGIC: Cranial nerves II through XII are intact. No gross focal neurological deficits. Sensation intact. Reflexes intact.  SKIN: No ulceration, lesions, rashes, or cyanosis. Skin warm and dry. Turgor intact.  PSYCHIATRIC: Mood, affect within normal limits. The patient is awake, alert and oriented x 3. Insight, judgment intact.       IMAGING    CT ABDOMEN PELVIS WO CONTRAST  Result Date: 11/05/2021 CLINICAL DATA:  Sepsis, fever, postoperative, rule out intra-abdominal abscess EXAM: CT ABDOMEN AND PELVIS WITHOUT CONTRAST TECHNIQUE: Multidetector CT  imaging of the abdomen and pelvis was performed following the standard protocol without IV contrast. Oral enteric contrast was administered. RADIATION DOSE REDUCTION: This exam was performed according to the departmental dose-optimization program which includes automated exposure control, adjustment of the mA and/or kV according to patient size and/or use of iterative reconstruction technique. COMPARISON:  10/27/2021 FINDINGS: Lower chest: Small right pleural effusion associated atelectasis or consolidation. Three-vessel coronary artery calcifications. Hepatobiliary: No solid liver abnormality is seen. No gallstones, gallbladder wall thickening, or biliary dilatation. Pancreas: Unremarkable. No pancreatic ductal dilatation or surrounding inflammatory changes. Spleen: Normal in size without significant abnormality. Adrenals/Urinary Tract: Adrenal glands are unremarkable. Kidneys are normal, without renal calculi, solid lesion, or hydronephrosis. Bladder is unremarkable. Stomach/Bowel: Stomach is within normal limits. The mid small bowel is diffusely gas and contrast filled, and mildly distended, largest loops measuring 4.1 cm. Status post right hemicolectomy. Descending and sigmoid diverticula. Vascular/Lymphatic: Aortic atherosclerosis. No enlarged abdominal or pelvic lymph nodes. Reproductive: No mass or other significant abnormality. Other: Anasarca. Persistent, although improved, scattered subcutaneous emphysema about the abdominal wall (series 2, image 38). Unchanged subcutaneous fluid collections of the left and right ventral abdomen, collection on the left measuring 3.4 x 2.2 cm (series 2, image 36), collection on the right measuring 3.5 x 2.0 cm (series 2, image 43). No ascites. Musculoskeletal: No acute or significant osseous findings. IMPRESSION: 1. Unchanged subcutaneous fluid collections of the left and right ventral abdomen, collection on the left measuring 3.4 x 2.2 cm, collection on the right measuring  3.5 x 2.0 cm. These most likely reflect postoperative hematoma or seroma, although presence or absence of infection within this fluid is not established by CT. 2. Status post right hemicolectomy. 3. The mid small bowel is diffusely gas and contrast filled, and mildly distended,  largest loops measuring 4.1 cm. Findings are most consistent with postoperative ileus. 4. Right pleural effusion and anasarca. 5. Coronary artery disease. Aortic Atherosclerosis (ICD10-I70.0). Electronically Signed   By: Delanna Ahmadi M.D.   On: 11/05/2021 17:34   CT ABDOMEN PELVIS WO CONTRAST  Result Date: 10/27/2021 CLINICAL DATA:  Postop day 4 from ileostomy reversal. Vomiting. Evaluate for anastomotic leak. EXAM: CT ABDOMEN AND PELVIS WITHOUT CONTRAST TECHNIQUE: Multidetector CT imaging of the abdomen and pelvis was performed following the standard protocol without IV contrast. RADIATION DOSE REDUCTION: This exam was performed according to the departmental dose-optimization program which includes automated exposure control, adjustment of the mA and/or kV according to patient size and/or use of iterative reconstruction technique. COMPARISON:  Abdominopelvic CT 11/05/2020.  Radiographs 10/26/2021. FINDINGS: Study was performed with the patient in the right lateral decubitus position. Rectal contrast was administered. Lower chest: Apparent right middle lobe collapse with a small amount of adjacent pleural fluid, incompletely visualized. Mild dependent atelectasis in both lower lobes. There is atherosclerosis of the aorta and coronary arteries. Hepatobiliary: Stable low-density liver lesions, likely cysts based on stability. Stable mild biliary dilatation status post cholecystectomy. Pancreas: Unremarkable. No pancreatic ductal dilatation or surrounding inflammatory changes. Spleen: Normal in size without focal abnormality. Adrenals/Urinary Tract: Both adrenal glands appear normal. Both kidneys demonstrate mild perinephric soft tissue  stranding, similar to previous study. No evidence of urinary tract calculus or hydronephrosis. The bladder appears unremarkable for its degree of distention. Stomach/Bowel: No oral contrast was administered. Rectal contrast has passed retrograde through the colon and through the ileocolonic anastomosis at the level of the mid transverse colon status post right hemicolectomy. There is retrograde filling of the distal small bowel. No anastomotic leak. The stomach and small bowel are fluid-filled and mildly distended. No evidence of bowel wall thickening or pneumatosis. Vascular/Lymphatic: There are no enlarged abdominal or pelvic lymph nodes. Diffuse aortic and branch vessel atherosclerosis. Reproductive: Stable appearance of the uterus and adnexa. No adnexal mass. Tubal ligation clips noted. Other: As seen on the postoperative radiographs, there is a large amount of soft tissue emphysema throughout the anterior abdominal wall which extends from the perineum into the presternal soft tissues. There are small focal fluid collections within the right and left anterior abdominal wall, likely related to recent laparoscopic procedure. No free intraperitoneal air, ascites or focal extraluminal fluid collections identified within the peritoneal cavity. Musculoskeletal: No acute or significant osseous findings. Stable chronic mild superior endplate compression fractures at L3 and L5. IMPRESSION: 1. Status post recent ileostomy reversal and ileocolonic anastomosis. The anastomosis is patent, without leak. 2. Nonspecific gastric and proximal small bowel distension without focal transition point, likely related to a postoperative ileus. Recommend radiographic follow-up. Patient may benefit from a nasogastric tube. 3. As seen on postoperative radiographs, there is extensive soft tissue emphysema throughout the abdominal wall which is likely related to the laparoscopic procedure. Soft tissue infection should be excluded clinically.  Small fluid collections in the anterior abdominal wall are attributed to the procedure, and no unexpected collections are identified. There is no pneumoperitoneum or abnormal intra-abdominal fluid collection. 4. Atelectasis at both lung bases with apparent partial right middle lobe collapse. 5. Extensive Aortic Atherosclerosis (ICD10-I70.0). Electronically Signed   By: Richardean Sale M.D.   On: 10/27/2021 16:28   DG Chest 1 View  Result Date: 10/26/2021 CLINICAL DATA:  Hypotension EXAM: CHEST  1 VIEW COMPARISON:  11/16/2020, 06/05/2020, PET CT 12/01/2019, CT chest 11/06/2019, 11/06/2019 FINDINGS: Mild cardiomegaly with central vascular  congestion. Small pleural effusions and diffuse increased interstitial opacity likely due to edema. Aortic atherosclerosis. No pneumothorax. Irregular opacity with mild distortion and nodularity in the right upper lobe and right hilus similar compared to intermittent previous exams and presumably due to scarring. IMPRESSION: 1. Cardiomegaly with vascular congestion, small pleural effusions and mild diffuse interstitial edema 2. Irregular and nodular opacities in the right hilus and upper lobe present on previous exams and probably due to scarring Electronically Signed   By: Donavan Foil M.D.   On: 10/26/2021 17:54   DG Chest 2 View  Result Date: 11/18/2021 CLINICAL DATA:  Admitted for acute kidney injury, continued fatigue. Recent pleural effusion on RIGHT. EXAM: CHEST - 2 VIEW COMPARISON:  Chest x-rays dated 11/16/2021, 11/14/2021, 01/20/2020 and 07/31/2020. FINDINGS: Heart size and mediastinal contours are stable. Chronic scarring is again within the RIGHT perihilar lung. Coarse lung markings are seen throughout both lungs. No confluent opacities to suggest superimposed pneumonia. Small bilateral pleural effusions, RIGHT greater than LEFT. No pneumothorax is seen. RIGHT-sided PICC line appears well positioned with tip at the level of the lower SVC/cavoatrial junction. No  acute or suspicious osseous abnormality. Extensive aortic atherosclerosis. Mildly distended gas-filled loops of small bowel within the upper abdomen, incompletely imaged. IMPRESSION: 1. Small bilateral pleural effusions, RIGHT greater than LEFT. 2. Coarse lung markings throughout both lungs, compatible with chronic interstitial lung disease/fibrosis. No evidence of superimposed pneumonia or pulmonary edema. 3. Aortic atherosclerosis. 4. Suspect continued postoperative ileus with mildly distended gas-filled loops of small bowel partially imaged within the upper abdomen. Electronically Signed   By: Franki Cabot M.D.   On: 11/18/2021 12:32   DG Chest 2 View  Result Date: 11/09/2021 CLINICAL DATA:  Shortness of breath.  Mild chest pain.  Smoker. EXAM: CHEST - 2 VIEW COMPARISON:  11/05/2021 FINDINGS: Right PICC tip in the region of the inferior aspect of the superior vena cava without significant change. The nasogastric tube has been removed. Normal sized heart. Stable linear scarring in the right upper lobe. No significant change in a small right pleural effusion and right basilar airspace opacity. Clear left lung. Mildly tortuous and calcified thoracic aorta. No acute bony abnormality. IMPRESSION: Stable small right pleural effusion and right basilar atelectasis and/or pneumonia. Electronically Signed   By: Claudie Revering M.D.   On: 11/09/2021 10:17   DG Abd 1 View  Result Date: 11/03/2021 CLINICAL DATA:  NG tube placement EXAM: ABDOMEN - 1 VIEW COMPARISON:  11/03/2021 11:05 a.m. FINDINGS: Advancement of NG tube with tip and side port below the GE junction. Redemonstrated small bowel dilatation, not significantly changed from the prior exam. The stomach is not distended. The lower abdomen is not included for evaluation. IMPRESSION: Interval advancement of nasogastric tube, with tip and side port overlying the stomach. Overall unchanged small bowel dilatation in the imaged portion of the abdomen. Electronically  Signed   By: Merilyn Baba M.D.   On: 11/03/2021 16:01   DG Abd 1 View  Result Date: 10/28/2021 CLINICAL DATA:  NG tube placement EXAM: ABDOMEN - 1 VIEW COMPARISON:  10/26/2021 FINDINGS: Limited radiograph of the lower chest and upper abdomen was obtained for the purposes of enteric tube localization. Enteric tube is seen coursing below the diaphragm with distal tipterminating within the proximal stomach. Side port terminates within the distal esophagus. Similar degree of gaseous distension of large and small bowel within the visualized abdomen. IMPRESSION: Enteric tube with distal tip terminating within the proximal stomach, side port terminating within  the distal esophagus. Recommend advancement 7-10 cm. Electronically Signed   By: Davina Poke D.O.   On: 10/28/2021 15:48   DG Abd 1 View  Result Date: 10/26/2021 CLINICAL DATA:  Hypotension history of ileostomy take down EXAM: ABDOMEN - 1 VIEW COMPARISON:  11/05/2020 FINDINGS: Clips in the pelvis. Moderate air distension of the stomach. Air-filled dilated small bowel up to 3.5 cm in the central abdomen. Extensive mottled appearance of the abdominal soft tissues presumably due to soft tissue emphysema related to recent laparoscopic procedure. IMPRESSION: 1. Air distension of stomach and small bowel either due to ileus or possible obstruction. 2. Diffuse mottled appearance of soft tissues felt consistent with soft tissue emphysema likely due to recent laparoscopic procedure Electronically Signed   By: Donavan Foil M.D.   On: 10/26/2021 17:51   CT CHEST WO CONTRAST  Result Date: 11/22/2021 CLINICAL DATA:  Pneumonia. Pleural effusion suspected. Evaluate lungs for fluid. EXAM: CT CHEST WITHOUT CONTRAST TECHNIQUE: Multidetector CT imaging of the chest was performed following the standard protocol without IV contrast. RADIATION DOSE REDUCTION: This exam was performed according to the departmental dose-optimization program which includes automated exposure  control, adjustment of the mA and/or kV according to patient size and/or use of iterative reconstruction technique. COMPARISON:  AP chest 11/21/2021; chest two views 11/18/2021; CT chest 11/06/2019 and 11/19/2018 FINDINGS: Cardiovascular: Heart size is moderately enlarged similar to prior. Mild pericardial effusion is new from 11/06/2019. Dense coronary artery calcifications are seen. The ascending aorta measures up to 3.4 cm in caliber, ectatic but not aneurysmal. Moderate to high-grade atherosclerotic calcifications of the thoracic aorta. Mediastinum/Nodes: No axillary, mediastinal, or hilar pathologically enlarged lymph nodes by CT criteria. Visualized thyroid gland is grossly unremarkable. The esophagus follows a normal course of normal caliber. Lungs/Pleura: The central airways are patent. Moderate to high-grade centrilobular emphysematous changes are again seen. Moderate apical paraseptal emphysematous changes. A right upper lobe opacity on the prior CT series 6, image 21 appears to have resolved. Contiguous more inferior right upper lobe more superior (axial image 56 and sagittal image 51) and inferior (axial image 68 and sagittal image 56) pulmonary opacities extending to the right hilum appears similar to prior with the more inferior opacity that contacts the anterior lung subjectively appearing thinner and with "less fullness" (axial images 62 through 70). Benign partially calcified 3 mm granuloma within the medial left lower lobe (axial series 3, image 69).a mild anterior left upper lobe subpleural thickening is unchanged (axial series 3, image 70). Moderate to high-grade right and mild to moderate left pleural effusions, increased from 11/06/2019. Upper Abdomen: Unchanged fluid density possible 11 mm cyst within the anterior right hepatic lobe. Musculoskeletal: Mild-to-moderate multilevel degenerative disc changes of the thoracic spine. Minimal dextrocurvature of the midthoracic spine. Old healed  anterior left second rib fracture. IMPRESSION:: IMPRESSION: 1. Moderate cardiomegaly, unchanged.  New mild pericardial effusion. 2. Coronary artery calcifications, an indicator of coronary artery disease. 3. Moderate to high-grade centrilobular emphysematous changes. 4. Moderate to high-grade right and mild-to-moderate left pleural effusions, increased from 11/06/2019. 5. Resolution of a superior right upper lobe opacity seen on prior 11/06/2019 CT. 6. 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 Aortic Atherosclerosis (ICD10-I70.0) and Emphysema (ICD10-J43.9). Electronically Signed   By: Yvonne Kendall M.D.   On: 11/22/2021 10:22   US RENAL  Result Date: 10/25/2021 CLINICAL DATA:  Acute kidney injury EXAM: RENAL / URINARY  TRACT ULTRASOUND COMPLETE COMPARISON:  CT 11/05/2020 FINDINGS: Right Kidney: Renal measurements: 8.9 x 4.1 x 4.2 cm = volume: 79.8 mL. Cortex slightly echogenic. No mass or hydronephrosis Left Kidney: Renal measurements: 7.5 x 4.4 x 4 cm = volume: 88.5 mL. Cortex slightly echogenic. No mass or hydronephrosis. Bladder: Appears normal for degree of bladder distention. Other: None. IMPRESSION: Slight increased cortical echogenicity suggesting medical renal disease. No hydronephrosis. Electronically Signed   By: Donavan Foil M.D.   On: 10/25/2021 16:28   DG Chest Port 1 View  Result Date: 11/22/2021 CLINICAL DATA:  Status post right thoracentesis. EXAM: PORTABLE CHEST 1 VIEW COMPARISON:  Earlier today. FINDINGS: Interval decrease in right basilar pleural fluid with no pneumothorax seen. Resolved atelectasis at the right lung base. Stable minimal left pleural effusion. Stable enlarged cardiac silhouette, tortuous and calcified thoracic aorta and linear scarring in the right upper lung zone. Right PICC tip in the region of the superior cavoatrial junction/inferior aspect of  the superior vena cava. Mild increase in mild linear atelectasis at the medial left lung base. No acute bony abnormality. IMPRESSION: 1. Decreased right pleural fluid without pneumothorax following thoracentesis. 2. Resolved right basilar atelectasis. 3. Mild increase in mild left lower lobe atelectasis. 4. Minimal left pleural effusion, small residual right pleural effusion and stable right upper lung zone scarring. Electronically Signed   By: Claudie Revering M.D.   On: 11/22/2021 16:42   DG Chest Port 1 View  Result Date: 11/21/2021 CLINICAL DATA:  Pulmonary edema, COPD EXAM: PORTABLE CHEST 1 VIEW COMPARISON:  11/18/2021 FINDINGS: Stable positioning of a right-sided PICC line. Stable mild cardiomegaly. Aortic atherosclerosis. Chronic scarring in the right perihilar region. Increasing right lower lobe airspace opacity. Small right-sided pleural effusion and trace left pleural effusion. No pneumothorax. IMPRESSION: 1. Worsening right lower lobe airspace opacity, suspicious for pneumonia. 2. Small right and trace left pleural effusions. Electronically Signed   By: Davina Poke D.O.   On: 11/21/2021 13:11   DG Chest Port 1 View  Result Date: 11/16/2021 CLINICAL DATA:  Shortness of breath EXAM: PORTABLE CHEST 1 VIEW COMPARISON:  Previous studies including the examination of 11/14/2021 FINDINGS: Transverse diameter of heart is increased. Central pulmonary vessels are prominent. Linear densities in the right parahilar region have not changed significantly. Small bilateral pleural effusions are seen, more so on the right side. Crowding of markings in the medial lower lung fields suggest atelectasis/pneumonia. There is no pneumothorax. Tip of PICC line is seen in superior vena cava close to right atrium. IMPRESSION: Cardiomegaly. Central pulmonary vessels are prominent. Prominence of interstitial markings in the right parahilar region and both lower lung fields may suggest interstitial edema or pneumonia. Small  bilateral pleural effusions, more so on the right side. Electronically Signed   By: Elmer Picker M.D.   On: 11/16/2021 08:13   DG Chest Port 1 View  Result Date: 11/14/2021 CLINICAL DATA:  Atelectasis, history of small-bowel obstruction EXAM: PORTABLE CHEST 1 VIEW COMPARISON:  11/13/2021 FINDINGS: No significant change in AP portable chest radiograph, with cardiomegaly and right upper extremity PICC. Diffuse bilateral interstitial pulmonary opacity, small bilateral pleural effusions, and bandlike scarring or atelectasis of the right midlung, unchanged. IMPRESSION: No significant change in AP portable chest radiograph, with cardiomegaly, probable edema, and bilateral pleural effusions. No new airspace opacity. Electronically Signed   By: Delanna Ahmadi M.D.   On: 11/14/2021 09:58   DG Chest Port 1 View  Result Date: 11/13/2021 CLINICAL DATA:  Post right thoracentesis EXAM:  PORTABLE CHEST 1 VIEW COMPARISON:  Chest x-ray dated November 12, 2021 FINDINGS: Cardiac and mediastinal contours are unchanged. Linear nodular opacity of the right upper lobe, unchanged from prior exam. No new parenchymal opacity. Interval decreased size of small right pleural effusion. No evidence pneumothorax. IMPRESSION: No evidence of pneumothorax. Electronically Signed   By: Yetta Glassman M.D.   On: 11/13/2021 11:46   DG Chest Port 1 View  Result Date: 11/12/2021 CLINICAL DATA:  Acute respiratory failure with hypoxia. EXAM: PORTABLE CHEST 1 VIEW COMPARISON:  November 09, 2021 FINDINGS: Right upper extremity PICC with tip near the superior cavoatrial junction. The heart size and mediastinal contours are within normal limits. Stable linear scarring in the right upper lobe. Slightly increased small right pleural effusion with adjacent airspace opacities. Increased conspicuity of the irregular nodular areas of consolidation in the right mid lung. The visualized skeletal structures are unremarkable. IMPRESSION: 1. Slightly  increased small right pleural effusion with adjacent airspace opacities. 2. Increased conspicuity of the irregular nodular areas of consolidation in the right mid lung. Electronically Signed   By: Dahlia Bailiff M.D.   On: 11/12/2021 08:55   DG Chest Port 1 View  Result Date: 11/05/2021 CLINICAL DATA:  Fever. EXAM: PORTABLE CHEST 1 VIEW COMPARISON:  Chest radiograph 10/26/2021. FINDINGS: Right upper extremity PICC line tip projects over the superior vena cava. Enteric tube is present. The side port is at the level of the distal esophagus. Stable enlarged cardiac and mediastinal contours. Similar-appearing distortion and consolidation within the mid right lung. Bibasilar atelectasis, right-greater-than-left. Probable small right pleural effusion. No pneumothorax. IMPRESSION: Enteric tube side-port at the distal esophagus, recommend advancement. Right upper extremity PICC line tip projects over the superior vena cava. Similar-appearing irregular and nodular areas of consolidation within the right mid lung. These results will be called to the ordering clinician or representative by the Radiologist Assistant, and communication documented in the PACS or [employer]. Electronically Signed   By: Lovey Newcomer M.D.   On: 11/05/2021 08:04   DG Abd 2 Views  Result Date: 11/06/2021 CLINICAL DATA:  Abdominal pain EXAM: ABDOMEN - 2 VIEW COMPARISON:  Abdominal x-ray 11/03/2021 FINDINGS: Enteric tube identified with the tip in the lower esophagus. Mildly distended loops of small bowel are again seen throughout the abdomen measuring up to 3.3 cm in diameter. No free air identified on the decubitus view. No suspicious calcifications identified. IMPRESSION: 1. Enteric tube tip is in the lower esophagus, advancement recommended. 2. Mildly distended loops of bowel throughout the abdomen which could be seen with ileus or obstruction. Continued follow-up recommended. Electronically Signed   By: Ofilia Neas M.D.   On:  11/06/2021 10:42   DG Abd 2 Views  Result Date: 11/03/2021 CLINICAL DATA:  Abdominal distention EXAM: ABDOMEN - 2 VIEW COMPARISON:  Previous studies including the examination of 11/02/2021 FINDINGS: There is interval worsening of small bowel dilation measuring up to 4.9 cm in diameter. Tip enteric tube is noted at the gastroesophageal junction pointing cephalad. There is no distention of stomach. There is interval passage of contrast from the colon. There is evidence of previous tubal ligation. IMPRESSION: There is interval increase in small bowel dilation suggesting high-grade small bowel obstruction. Tip of enteric tube is at the gastroesophageal junction pointing cephalad. NG tube should be advanced 5-10 cm to place the tip and side port within the stomach. Electronically Signed   By: Elmer Picker M.D.   On: 11/03/2021 12:36   DG Abd 2 Views  Result Date: 10/31/2021 CLINICAL DATA:  Ileus following gastrointestinal surgery, colostomy takedown 10/23/2021 EXAM: ABDOMEN - 2 VIEW COMPARISON:  10/28/2021 FINDINGS: Nasogastric tube in proximal stomach. Persistent dilatation of small bowel loops in the abdomen without wall thickening. Small amount retained contrast in colon. Levoconvex lumbar scoliosis. Foci of soft tissue gas at RIGHT lateral abdominal wall consistent with preceding surgery. Atelectasis at RIGHT lung base. IMPRESSION: Persistent small bowel ileus. Electronically Signed   By: Lavonia Dana M.D.   On: 10/31/2021 10:59   DG Abd Portable 1V-Small Bowel Obstruction Protocol-initial, 8 hr delay  Result Date: 11/02/2021 CLINICAL DATA:  8 hour follow-up small-bowel delay EXAM: PORTABLE ABDOMEN - 1 VIEW COMPARISON:  Film from the previous day. FINDINGS: Gastric catheter is noted within the stomach. Contrast material administered now lies within the dilated small bowel as well as within the colon. These changes are consistent with a partial small bowel obstruction. No free air is noted.  IMPRESSION: Passage of contrast into the colon consistent with a partial small bowel obstruction. Electronically Signed   By: Inez Catalina M.D.   On: 11/02/2021 03:13   DG Abd Portable 1V  Result Date: 11/01/2021 CLINICAL DATA:  Enteric catheter placement EXAM: PORTABLE ABDOMEN - 1 VIEW COMPARISON:  10/31/2021 FINDINGS: Frontal view of the lower chest and upper abdomen demonstrates enteric catheter tip and side port projecting over the gastric fundus. Right-sided PICC tip overlies atriocaval junction. Cardiac silhouette is enlarged. Small right pleural effusion. Stable vascular congestion. Stable distended gas-filled loops of small bowel, likely ileus given recent surgery. IMPRESSION: 1. Enteric catheter projecting over gastric fundus. 2. Stable small bowel dilatation consistent with ileus. Electronically Signed   By: Randa Ngo M.D.   On: 11/01/2021 16:02   ECHOCARDIOGRAM COMPLETE  Result Date: 10/27/2021    ECHOCARDIOGRAM REPORT   Patient Name:   JALENE DEMO [facility] Date of Exam: 10/27/2021 Medical Rec #:  [id number]       Height:       66.0 in Accession #:    [id number]      Weight:       124.0 lb Date of Birth:  1956-02-23       BSA:          23.632 m Patient Age:    45 years        BP:           144/84 mmHg Patient Gender: F               HR:           95 bpm. Exam Location:  [facility] Procedure: 2D Echo, Cardiac Doppler and Color Doppler Indications:     Elevated troponin  History:         Patient has prior history of Echocardiogram examinations, most                  recent 11/07/2019. COPD, Arrythmias:Atrial Fibrillation,                  Signs/Symptoms:Murmur; Risk Factors:Hypertension.  Sonographer:     Sherrie Sport Referring Phys:  [place] Diagnosing Phys: Donnelly Angelica IMPRESSIONS  1. Left ventricular ejection fraction, by estimation, is 60 to 65%. The left ventricle has normal function. The left ventricle has no regional wall motion abnormalities. Left ventricular diastolic parameters are  consistent with Grade I diastolic dysfunction (impaired relaxation).  2. Right ventricular systolic function is normal. The right ventricular size is mildly enlarged.  3.  Right atrial size was mildly dilated.  4. The mitral valve is normal in structure. Moderate mitral valve regurgitation. No evidence of mitral stenosis.  5. Tricuspid valve regurgitation is moderate.  6. The aortic valve is calcified. Aortic valve regurgitation is not visualized. Mild aortic valve stenosis.  7. The inferior vena cava is normal in size with greater than 50% respiratory variability, suggesting right atrial pressure of 3 mmHg. Conclusion(s)/Recommendation(s): CALCIFIED AORTIC VALVE WITH MILD AS. VMax 2.1 m/s. Dimensionless index 0.4. FINDINGS  Left Ventricle: Left ventricular ejection fraction, by estimation, is 60 to 65%. The left ventricle has normal function. The left ventricle has no regional wall motion abnormalities. The left ventricular internal cavity size was normal in size. There is  no left ventricular hypertrophy. Left ventricular diastolic parameters are consistent with Grade I diastolic dysfunction (impaired relaxation). Right Ventricle: The right ventricular size is mildly enlarged. No increase in right ventricular wall thickness. Right ventricular systolic function is normal. Left Atrium: Left atrial size was normal in size. Right Atrium: Right atrial size was mildly dilated. Pericardium: There is no evidence of pericardial effusion. Mitral Valve: The mitral valve is normal in structure. Moderate mitral valve regurgitation. No evidence of mitral valve stenosis. MV peak gradient, 3.1 mmHg. The mean mitral valve gradient is 1.0 mmHg. Tricuspid Valve: The tricuspid valve is normal in structure. Tricuspid valve regurgitation is moderate . No evidence of tricuspid stenosis. Aortic Valve: The aortic valve is calcified. Aortic valve regurgitation is not visualized. Mild aortic stenosis is present. Aortic valve mean gradient  measures 9.4 mmHg. Aortic valve peak gradient measures 16.8 mmHg. Aortic valve area, by VTI measures 1.21 cm. Pulmonic Valve: The pulmonic valve was not well visualized. Pulmonic valve regurgitation is not visualized. No evidence of pulmonic stenosis. Aorta: The aortic root is normal in size and structure. Venous: The inferior vena cava is normal in size with greater than 50% respiratory variability, suggesting right atrial pressure of 3 mmHg. IAS/Shunts: No atrial level shunt detected by color flow Doppler.  LEFT VENTRICLE PLAX 2D LVIDd:         4.80 cm   Diastology LVIDs:         3.00 cm   LV e' medial:    5.87 cm/s LV PW:         1.10 cm   LV E/e' medial:  13.9 LV IVS:        0.75 cm   LV e' lateral:   12.40 cm/s LVOT diam:     2.00 cm   LV E/e' lateral: 6.6 LV SV:         45 LV SV Index:   28 LVOT Area:     3.14 cm  RIGHT VENTRICLE RV Basal diam:  3.80 cm RV S prime:     12.20 cm/s TAPSE (M-mode): 1.8 cm LEFT ATRIUM             Index        RIGHT ATRIUM           Index LA diam:        3.20 cm 1.96 cm/m   RA Area:     19.30 cm LA Vol (A2C):   58.8 ml 36.03 ml/m  RA Volume:   63.90 ml  39.15 ml/m LA Vol (A4C):   38.7 ml 23.71 ml/m LA Biplane Vol: 49.1 ml 30.08 ml/m  AORTIC VALVE  PULMONIC VALVE AV Area (Vmax):    1.04 cm      PV Vmax:        0.59 m/s AV Area (Vmean):   0.91 cm      PV Vmean:       35.200 cm/s AV Area (VTI):     1.21 cm      PV VTI:         0.109 m AV Vmax:           205.20 cm/s   PV Peak grad:   1.4 mmHg AV Vmean:          140.200 cm/s  PV Mean grad:   1.0 mmHg AV VTI:            0.373 m       RVOT Peak grad: 4 mmHg AV Peak Grad:      16.8 mmHg AV Mean Grad:      9.4 mmHg LVOT Vmax:         67.90 cm/s LVOT Vmean:        40.500 cm/s LVOT VTI:          0.144 m LVOT/AV VTI ratio: 0.39  AORTA Ao Root diam: 3.17 cm MITRAL VALVE               TRICUSPID VALVE MV Area (PHT): 4.86 cm    TR Peak grad:   52.4 mmHg MV Area VTI:   3.01 cm    TR Vmax:        362.00 cm/s MV Peak  grad:  3.1 mmHg MV Mean grad:  1.0 mmHg    SHUNTS MV Vmax:       0.88 m/s    Systemic VTI:  0.14 m MV Vmean:      55.0 cm/s   Systemic Diam: 2.00 cm MV Decel Time: 156 msec    Pulmonic VTI:  0.157 m MV E velocity: 81.40 cm/s MV A velocity: 51.40 cm/s MV E/A ratio:  1.58 Donnelly Angelica Electronically signed by Donnelly Angelica Signature Date/Time: 10/27/2021/4:07:10 PM    Final    [place] EKG SITE RITE  Result Date: 11/01/2021 If Site Rite image not attached, placement could not be confirmed due to current cardiac rhythm.  US THORACENTESIS ASP PLEURAL SPACE W/IMG GUIDE  Result Date: 11/23/2021 INDICATION: Patient with history of CKD, COPD, admitted s/p ileostomy reversal now with persistent right pleural effusion. Request made for therapeutic right thoracentesis. EXAM: ULTRASOUND GUIDED RIGHT THORACENTESIS MEDICATIONS: 10 mL 1% lidocaine COMPLICATIONS: None immediate. PROCEDURE: An ultrasound guided thoracentesis was thoroughly discussed with the patient and questions answered. The benefits, risks, alternatives and complications were also discussed. The patient understands and wishes to proceed with the procedure. Written consent was obtained. Ultrasound was performed to localize and mark an adequate pocket of fluid in the right chest. The area was then prepped and draped in the normal sterile fashion. 1% Lidocaine was used for local anesthesia. Under ultrasound guidance a 6 Fr Safe-T-Centesis catheter was introduced. Thoracentesis was performed. The catheter was removed and a dressing applied. FINDINGS: A total of approximately 550 mL of amber fluid was removed. IMPRESSION: Successful ultrasound guided right therapeutic thoracentesis yielding 550 mL of pleural fluid. Read by: Brynda Greathouse PA-C Electronically Signed   By: Albin Felling M.D.   On: 11/23/2021 07:43   US THORACENTESIS ASP PLEURAL SPACE W/IMG GUIDE  Result Date: 11/13/2021 INDICATION: Right pleural effusion with shortness breath request received for  diagnostic and therapeutic thoracentesis. EXAM: ULTRASOUND  GUIDED RIGHT THORACENTESIS MEDICATIONS: Local 1% lidocaine only. COMPLICATIONS: None immediate. PROCEDURE: An ultrasound guided thoracentesis was thoroughly discussed with the patient and questions answered. The benefits, risks, alternatives and complications were also discussed. The patient understands and wishes to proceed with the procedure. Written consent was obtained. Ultrasound was performed to localize and mark an adequate pocket of fluid in the right chest. The area was then prepped and draped in the normal sterile fashion. 1% Lidocaine was used for local anesthesia. Under ultrasound guidance a 19 gauge, 7-cm, Yueh catheter was introduced. Thoracentesis was performed. The catheter was removed and a dressing applied. FINDINGS: A total of approximately 1 L of amber colored fluid was removed. Samples were sent to the laboratory as requested by the clinical team. IMPRESSION: Successful ultrasound guided right thoracentesis yielding 1 L of pleural fluid. This exam was performed by Tsosie Billing PA-C, and was supervised and interpreted by Dr. Denna Haggard. Electronically Signed   By: Albin Felling M.D.   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     Suspect transudative fluid due to CKD - Noted nephrology on case - appreciate input  -s/p thoracentesis - yellow fluid - previous non malignant effusion, repeat cytology in process   Hx of right lung and esophageal cancer  - CT  chest with spiculated lesion above  Per medical oncology Dr B notes ## Right middle lung nodule- s/p Bronch- atypical cells [[name]]; last CT May 2017 ; SEP 29th PET- ~2cm; low SUV. A. LUNG MASS, RIGHT; CT-GUIDED BIOPSY: - ADENOCARCINOMA, ACINAR AND PAPILLARY MORPHOLOGIES. S/p RT [finished Dec 2017] s/p RT [DEC 2017]; AUG 2019- PET-right middle lobe radiation changes no evidence of recurrence.   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  T1b- submucosal involvement; N0- stage I [[name]; [facility]]; unable to proceed with Endo mucosal resection-because of extensive disease [Dr.Branch; [facility]]; recommend definitive radiation [finished  RT- 09/21/2019]  Possible recurrence of cancer ? - will ask Dr B to eval on outpatient -she is known to his service- possible PET vs bx vs surveillance only  Right lower lobe atelectasis   - patient would benefit from recruitment maneuvers    - will order RT Metaneb with albuterol BID    - Incentive spirometry and flutter valve PRN   -s/p CXR with improvement   CKD   - likely culprit with fluid overload contributing to pulmonary and gut edema   -also differential is CHF vs malignant effusion  Previous speciment 11/14/20 CYTOLOGY - NON PAP  CASE: [id number]  PATIENT: Anasha South  Non-Gynecological Cytology Report      Specimen Submitted:  A. Pleural fluid, right   Clinical History: None provided       DIAGNOSIS:  A. PLEURAL FLUID; ULTRASOUND-GUIDED THORACENTESIS:  - NEGATIVE; NO EVIDENCE OF MALIGNANCY.  - MESOTHELIAL  CELLS AND MIXED INFLAMMATORY CELLS.     Atrial fibrillation - chronic - potential for rapid ventricular response due to albuterol/ipratropium therapy -continue amio 200 bid  -dcd cardizem and added metop succ bid 12.5    Thank you for  allowing me to participate in the care of this patient.    Patient/Family are satisfied with care plan and all questions have been answered.  This document was prepared using Dragon voice recognition software and may include unintentional dictation errors.     Ottie Glazier, M.D.  Division of Pulmonary & [place]         .fa

## 2021-11-24 LAB — COMPREHENSIVE METABOLIC PANEL
ALT: 10 U/L (ref 0–44)
AST: 17 U/L (ref 15–41)
Albumin: 3.1 g/dL — ABNORMAL LOW (ref 3.5–5.0)
Alkaline Phosphatase: 60 U/L (ref 38–126)
Anion gap: 10 (ref 5–15)
BUN: 37 mg/dL — ABNORMAL HIGH (ref 8–23)
CO2: 33 mmol/L — ABNORMAL HIGH (ref 22–32)
Calcium: 9.1 mg/dL (ref 8.9–10.3)
Chloride: 94 mmol/L — ABNORMAL LOW (ref 98–111)
Creatinine, Ser: 2.64 mg/dL — ABNORMAL HIGH (ref 0.44–1.00)
GFR, Estimated: 19 mL/min — ABNORMAL LOW (ref >60)
Glucose, Bld: 82 mg/dL (ref 70–99)
Potassium: 3.7 mmol/L (ref 3.5–5.1)
Sodium: 137 mmol/L (ref 135–145)
Total Bilirubin: 0.4 mg/dL (ref 0.3–1.2)
Total Protein: 7.3 g/dL (ref 6.5–8.1)

## 2021-11-24 LAB — MAGNESIUM: Magnesium: 1.9 mg/dL (ref 1.7–2.4)

## 2021-11-24 MED ORDER — TORSEMIDE 20 MG PO TABS: 20.0000 mg | ORAL_TABLET | Freq: Every day | ORAL | 0 refills | Status: DC

## 2021-11-24 MED ORDER — METOPROLOL TARTRATE 25 MG PO TABS: 12.5000 mg | ORAL_TABLET | Freq: Two times a day (BID) | ORAL | 0 refills | Status: DC

## 2021-11-24 MED ORDER — AMIODARONE HCL 200 MG PO TABS: 200.0000 mg | ORAL_TABLET | Freq: Two times a day (BID) | ORAL | 0 refills | Status: DC

## 2021-11-24 MED ORDER — MIDODRINE HCL 5 MG PO TABS: 5.0000 mg | ORAL_TABLET | Freq: Three times a day (TID) | ORAL | 0 refills | Status: DC

## 2021-11-24 MED ORDER — TORSEMIDE 20 MG PO TABS: 20.0000 mg | ORAL_TABLET | Freq: Every day | ORAL | Status: DC

## 2021-11-24 MED ORDER — MIDODRINE HCL 5 MG PO TABS: 5.0000 mg | ORAL_TABLET | Freq: Three times a day (TID) | ORAL | Status: DC

## 2021-11-24 NOTE — Progress Notes (Signed)
PULMONOLOGY         Date: 11/24/2021,   MRN# [id number] Danielle [name] [birth year]     AdmissionWeight: 56.2 kg                 CurrentWeight: 56.3 kg   Referring physician: Dr Peyton Najjar   CHIEF COMPLAINT:   Increased O2 requirement.   HISTORY OF PRESENT ILLNESS   This is a pleasant 66 yo with complex comorbid history including, COPD, AF, HTN, CKD3, s/p ileostomy reversal with persistent increase O2 req and notable pleural effusion. She is s/p thoracentesis yielding 1L fluid drained from right pleural space.  She is on supplemental O2 and feels improved.    11/14/21 - patient received VEST therapy yesterday with RT and felt that it may be helping. We have repeat CXR scheduled for today. She took few breaths on IS up to 614m tidal volume. We discussed importance of PT/OT and she agrees to work with them for strength training. Yesterday she had big BM during my visit along with physical therapist. She remains on 3.5L/min and Ive discussed with RN to wean that down as able with goal spO2 >88%.  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     11/16/21- patient is further improved with O2 weaned to 2L/min during my evaluation today.  11/18/21- patient still hospitalized and has increased O2 requirement at 3L/min now.  She cannot walk without desaturation.  I think this is related to fluid balance and renal failure. CXR with bilateral effusions and pulmonary edema.  Renal function is worse this am.  Im gonna increase her blood pressure today and this should help with diuresis and we should continue PT to treat atelectatic segments.   11/19/21- patient is about same as yesterday, clinically however she does seem improved.    11/20/21- Patient seen and examined at bedside. She remains with hypoxemia. Plan has been reviewed with team and I started amio 200 bid PO today, reduced cardizem to  120 stopped metoprolol , started midodrine tid   11/21/21- patient has had no chage from yesterday, I have intensified her diuresis today and icreased midodrine to 10 tid  11/22/21- patient seen and examined at bedside.  She reports feeling better and we have weaned O2 slightly but not good enough. Urine output was better with unmeasured urine +400cc measured overnight. Her X ray report this am reads right lung lower lobe pneumonia.  To me this appears more like fluid with underlying atelectasis,  I have ordered CT chest without contrast to better characterize lung parenchyma. Blood work for this AM is in process. I have reviewed paln with RT today and we will initiate metaneb recruitment maneuvers as well.   11/23/21- patient improved from previous. S/p 550 amber colored fluid removed. I reviewed CT chest there is right upper lobe mass which is stable from past scan in Jan 2022.  I reviewed imaging with radiologist to confirm stability. Further previous fluid cytology is negative for atypia and coupled with stable imaging suggests non-cancer etiology most likely CHF/CKD overlap.  She does have additional plan for eval with medical oncology Dr B on outpatient in few wks time. This morning labs show stable renal function but azotemia and hemoconcentration is forming suggestive of contraction so we will have to be cautious with ongoing diuresis, appreciate renal team for collaboration. Her BP still soft on midodrine 10 tid, on cardizem  120 the HR is well controlled in 70s, plan to stop this today and initiate metop succinate 12.5 bid with goal to allow a better blood pressure while still controlling AF in conjuction with amiodarone 200 bid.  AM cortisol level ordered due to previous glucocorticoid use. Gabapentin dcd today due to fluid retention as side effect. She does not need Duoneb or Albuterol nebulizer therapy this will likely only worsen her AF without added benefit ater use of Breo Ellipta and Incruse which  she has scheduled.    11/24/21- patient improved clinically s/p PT with spO2 >95% throughout walk.  She is on 2L/min [place] and I think we can wean that down.  This am labs with expected bump in creatinine and pre-renal azotemia.  BP has been stable and AF rate controlled.  I have dcd midodrine and reduced torsemide to 20 po daily. Nephrology is following and collaboration is appreciated there is additional nephrology follow up on outpatient scheduled with Dr Holley Raring. Plan to dc on amiodarone and metop current dose. No need for midodrine at this point due to improved clinical status with stable vitals.  She will need follow up with all her doctors including surgery, cardiology,pulm, nephrololgy, PCP.  Thank you for allowing to participate in the care of this patient.   PAST MEDICAL HISTORY   Past Medical History:  Diagnosis Date   Anemia    Anxiety    Aortic atherosclerosis ([place])    Aortic valve stenosis 02/10/2018   a.) TTE 02/10/2018: EF 55-60%: mild AS with MPG of 12 mmHg. b.) TTE 04/21/2018: EF 35-40%; mild AS with MPG 8 mmHg. c.) TTE 11/07/2019: EF 50-55%; mild AS with MPG 10 mmHg.   Arthritis    Atrial fibrillation (HCC)    a.) CHA2DS2-VASc = 4 (age, sex, HTN, aortic plaque). b.) Rate/rhythm maintained on oral carvedilol; chronically anticoagulated on dose reduced apixaban. c.)  Attempted deployment of LAA occlusive device on 01/11/2021; parameters failed and procedure aborted.   Breast cancer, left ([place]) 2000   a.) T2N1M0; ER/PR (+) --> Tx'd with total mastectomy, LN resection, XRT, and chemotherapy   Cancer of right lung ([place]) 07/30/2016   a.) adenocarcinoma; ALK, ROS1, PDL1, BRAF, EGFR all negative.   CKD (chronic kidney disease), stage IV (HCC)    COPD (chronic obstructive pulmonary disease) (HCC)    Dependence on supplemental oxygen    Depression    Diastolic dysfunction 85/63/1497   a.) TTE 02/10/2017: EF 55-60%; G2DD. b.) TTE 04/21/2018: EF 35-40%; mild LA dilation; mod MV  regurgitation. c.) TTE 11/07/2019: EF 50-55%; G1DD.   DOE (dyspnea on exertion)    GERD (gastroesophageal reflux disease)    Heart murmur    History of 2019 novel coronavirus disease (COVID-19) 10/14/2019   HLD (hyperlipidemia)    Hypertension    Long term current use of anticoagulant    a.) apixaban   Lymphedema    Personal history of chemotherapy    Personal history of radiation therapy    SBO (small bowel obstruction) ([place]) 11/05/2020   Vitamin D deficiency      SURGICAL HISTORY   Past Surgical History:  Procedure Laterality Date   Breast Biospy Left    [facility]   BREAST SURGERY     COLONOSCOPY N/A 04/30/2018   Procedure: COLONOSCOPY;  Surgeon: Virgel Manifold, MD;  Location: [facility] ENDOSCOPY;  Service: Endoscopy;  Laterality: N/A;   COLONOSCOPY N/A 07/22/2018   Procedure: COLONOSCOPY;  Surgeon: Virgel Manifold, MD;  Location: [facility] ENDOSCOPY;  Service:  Endoscopy;  Laterality: N/A;   COLONOSCOPY WITH PROPOFOL N/A 09/21/2021   Procedure: COLONOSCOPY WITH PROPOFOL;  Surgeon: Benjamine Sprague, DO;  Location: [facility] ENDOSCOPY;  Service: General;  Laterality: N/A;   DILATION AND CURETTAGE OF UTERUS     ELECTROMAGNETIC NAVIGATION BROCHOSCOPY Right 04/11/2016   Procedure: ELECTROMAGNETIC NAVIGATION BRONCHOSCOPY;  Surgeon: Vilinda Boehringer, MD;  Location: [facility] ORS;  Service: Cardiopulmonary;  Laterality: Right;   ESOPHAGOGASTRODUODENOSCOPY N/A 07/22/2018   Procedure: ESOPHAGOGASTRODUODENOSCOPY (EGD);  Surgeon: Virgel Manifold, MD;  Location: [facility] ENDOSCOPY;  Service: Endoscopy;  Laterality: N/A;   ESOPHAGOGASTRODUODENOSCOPY (EGD) WITH PROPOFOL N/A 05/07/2018   Procedure: ESOPHAGOGASTRODUODENOSCOPY (EGD) WITH PROPOFOL;  Surgeon: Lucilla Lame, MD;  Location: [facility] ENDOSCOPY;  Service: Endoscopy;  Laterality: N/A;   ESOPHAGOGASTRODUODENOSCOPY (EGD) WITH PROPOFOL N/A 04/24/2019   Procedure: ESOPHAGOGASTRODUODENOSCOPY (EGD) WITH PROPOFOL;  Surgeon: Jonathon Bellows, MD;  Location: [facility]  ENDOSCOPY;  Service: Gastroenterology;  Laterality: N/A;   ESOPHAGOGASTRODUODENOSCOPY (EGD) WITH PROPOFOL N/A 01/12/2020   Procedure: ESOPHAGOGASTRODUODENOSCOPY (EGD) WITH PROPOFOL;  Surgeon: Jonathon Bellows, MD;  Location: [facility] ENDOSCOPY;  Service: Gastroenterology;  Laterality: N/A;   ESOPHAGOGASTRODUODENOSCOPY (EGD) WITH PROPOFOL N/A 04/28/2020   Procedure: ESOPHAGOGASTRODUODENOSCOPY (EGD) WITH PROPOFOL;  Surgeon: Jonathon Bellows, MD;  Location: [facility] ENDOSCOPY;  Service: Gastroenterology;  Laterality: N/A;   EUS N/A 05/07/2019   Procedure: FULL UPPER ENDOSCOPIC ULTRASOUND (EUS) RADIAL;  Surgeon: Jola Schmidt, MD;  Location: [facility] ENDOSCOPY;  Service: Endoscopy;  Laterality: N/A;   ILEOSCOPY N/A 07/22/2018   Procedure: ILEOSCOPY THROUGH STOMA;  Surgeon: Virgel Manifold, MD;  Location: [facility] ENDOSCOPY;  Service: Endoscopy;  Laterality: N/A;   ILEOSTOMY     ILEOSTOMY N/A 09/08/2018   Procedure: ILEOSTOMY REVISION POSSIBLE CREATION;  Surgeon: Herbert Pun, MD;  Location: [facility] ORS;  Service: General;  Laterality: N/A;   ILEOSTOMY CLOSURE N/A 08/15/2018   Procedure: DILATION OF ILEOSTOMY STRICTURE;  Surgeon: Herbert Pun, MD;  Location: [facility] ORS;  Service: General;  Laterality: N/A;   LAPAROTOMY Right 05/04/2018   Procedure: EXPLORATORY LAPAROTOMY right colectomy right and left ostomy;  Surgeon: Herbert Pun, MD;  Location: [facility] ORS;  Service: General;  Laterality: Right;   LEFT ATRIAL APPENDAGE OCCLUSION N/A 01/11/2021   Procedure: LEFT ATRIAL APPENDAGE OCCLUSION ([place]); ABORTED PROCEDURE WITHOUT DEVICE BEING IMPLANTED; Location: [facility]; Surgeon: Mylinda Latina, MD   LUNG BIOPSY     MASTECTOMY Left    2000, [place] Right    [place]   XI ROBOTIC ASSISTED COLOSTOMY TAKEDOWN N/A 10/23/2021   Procedure: XI ROBOTIC ASSISTED ILEOSTOMY TAKEDOWN;  Surgeon: Herbert Pun, MD;  Location: [facility] ORS;  Service: General;  Laterality: N/A;  180 minutes for  the surgery part please     FAMILY HISTORY   Family History  Problem Relation Age of Onset   Breast cancer Mother 90   Cancer Mother        Breast    Cirrhosis Father    Breast cancer Paternal Aunt 67   Cancer Maternal Aunt        Breast      SOCIAL HISTORY   Social History   Tobacco Use   Smoking status: Former    Packs/day: 0.50    Years: 20.00    Pack years: 10.00    Types: Cigarettes    Quit date: 07/02/2012    Years since quitting: 9.4   Smokeless tobacco: Current    Types: Snuff   Tobacco comments:    quit 2014  Vaping Use   Vaping Use: Never used  Substance Use Topics   Alcohol use: Yes    Comment: Occasionally beer   Drug use: No     MEDICATIONS    Home Medication:    Current Medication:  Current Facility-Administered Medications:    alum & mag hydroxide-simeth (MAALOX/MYLANTA) 200-200-20 MG/5ML suspension 15-30 mL, 15-30 mL, Oral, Q4H PRN, Windell Moment, Edgardo, MD, 30 mL at 11/14/21 1500   amiodarone (PACERONE) tablet 200 mg, 200 mg, Oral, BID, Bo Rogue, MD, 200 mg at 11/23/21 2036   apixaban (ELIQUIS) tablet 2.5 mg, 2.5 mg, Oral, BID, Dallie Piles, RPH, 2.5 mg at 11/23/21 2035   Chlorhexidine Gluconate Cloth 2 % PADS 6 each, 6 each, Topical, Daily, Herbert Pun, MD, 6 each at 11/23/21 1006   citalopram (CELEXA) tablet 40 mg, 40 mg, Oral, BH-[id number], [name]-Diaz, Reeves Forth, MD, 40 mg at 11/24/21 0543   famotidine (PEPCID) tablet 20 mg, 20 mg, Oral, Daily, [name]-Diaz, Edgardo, MD, 20 mg at 11/23/21 1002   feeding supplement (NEPRO CARB STEADY) liquid 237 mL, 237 mL, Oral, TID BM, [name]-Diaz, Edgardo, MD, Last Rate: 0 mL/hr at 11/21/21 2100, 237 mL at 11/22/21 1000   fluticasone (FLONASE) 50 MCG/ACT nasal spray 2 spray, 2 spray, Each Nare, Daily, Nicole Kindred A, DO, 2 spray at 11/23/21 1158   fluticasone furoate-vilanterol (BREO ELLIPTA) 100-25 MCG/ACT 1 puff, 1 puff, Inhalation, Daily, 1 puff at 11/22/21 0912 **AND** umeclidinium  bromide (INCRUSE ELLIPTA) 62.5 MCG/ACT 1 puff, 1 puff, Inhalation, Daily, Benita Gutter, RPH, 1 puff at 11/23/21 1357   lip balm (BLISTEX) ointment, , Topical, PRN, Herbert Pun, MD, Given at 11/06/21 0905   loratadine (CLARITIN) tablet 10 mg, 10 mg, Oral, Daily, Nicole Kindred A, DO, 10 mg at 11/23/21 3818   menthol-cetylpyridinium (CEPACOL) lozenge 3 mg, 1 lozenge, Oral, PRN, Herbert Pun, MD, 3 mg at 11/07/21 0522   metoprolol tartrate (LOPRESSOR) tablet 12.5 mg, 12.5 mg, Oral, BID, Lanney Gins, Deryl Giroux, MD, 12.5 mg at 11/23/21 2036   ondansetron (ZOFRAN-ODT) disintegrating tablet 4 mg, 4 mg, Oral, Q6H PRN, 4 mg at 11/07/21 1255 **OR** ondansetron (ZOFRAN) injection 4 mg, 4 mg, Intravenous, Q6H PRN, Herbert Pun, MD, 4 mg at 10/29/21 0051   oxyCODONE-acetaminophen (PERCOCET/ROXICET) 5-325 MG per tablet 1-2 tablet, 1-2 tablet, Oral, Q4H PRN, Herbert Pun, MD, 1 tablet at 11/23/21 1754   sodium chloride (OCEAN) 0.65 % nasal spray 1 spray, 1 spray, Each Nare, PRN, Sharion Settler, NP, 1 spray at 11/12/21 1206   sodium chloride flush (NS) 0.9 % injection 10-40 mL, 10-40 mL, Intracatheter, Q12H, [name]-Diaz, Edgardo, MD, 10 mL at 11/23/21 2040   sodium chloride flush (NS) 0.9 % injection 10-40 mL, 10-40 mL, Intracatheter, PRN, Herbert Pun, MD   [START ON 11/25/2021] torsemide (DEMADEX) tablet 20 mg, 20 mg, Oral, Daily, Ottie Glazier, MD   Vitamin D (Ergocalciferol) (DRISDOL) capsule 50,000 Units, 50,000 Units, Oral, Q7 days, Murlean Iba, MD, 50,000 Units at 11/20/21 1453    ALLERGIES   Patient has no known allergies.     REVIEW OF SYSTEMS    Review of Systems:  Gen:  Denies  fever, sweats, chills weigh loss  HEENT: Denies blurred vision, double vision, ear pain, eye pain, hearing loss, nose bleeds, sore throat Cardiac:  No dizziness, chest pain or heaviness, chest tightness,edema Resp:   Denies cough or sputum porduction, shortness of  breath,wheezing, hemoptysis,  Gi: Denies swallowing difficulty, stomach pain, nausea or vomiting, diarrhea, constipation, bowel incontinence Gu:  Denies bladder incontinence, burning urine Ext:   Denies Joint pain, stiffness  or swelling Skin: Denies  skin rash, easy bruising or bleeding or hives Endoc:  Denies polyuria, polydipsia , polyphagia or weight change Psych:   Denies depression, insomnia or hallucinations   Other:  All other systems negative   VS: BP 105/63 (BP Location: Left Leg)    Pulse 72    Temp 97.9 F (36.6 C) (Oral)    Resp 16    Ht _0  (1.676 m)    Wt 56.3 kg    SpO2 98%    BMI 20.03 kg/m      PHYSICAL EXAM    GENERAL:NAD, no fevers, chills, no weakness no fatigue HEAD: Normocephalic, atraumatic.  EYES: Pupils equal, round, reactive to light. Extraocular muscles intact. No scleral icterus.  MOUTH: Moist mucosal membrane. Dentition intact. No abscess noted.  EAR, NOSE, THROAT: Clear without exudates. No external lesions.  NECK: Supple. No thyromegaly. No nodules. No JVD.  PULMONARY: mild rhochi worse on right CARDIOVASCULAR: S1 and S2. Regular rate and rhythm. No murmurs, rubs, or gallops. No edema. Pedal pulses 2+ bilaterally.  GASTROINTESTINAL: Soft, nontender, nondistended. No masses. Positive bowel sounds. No hepatosplenomegaly.  MUSCULOSKELETAL: No swelling, clubbing, or edema. Range of motion full in all extremities.  NEUROLOGIC: Cranial nerves II through XII are intact. No gross focal neurological deficits. Sensation intact. Reflexes intact.  SKIN: No ulceration, lesions, rashes, or cyanosis. Skin warm and dry. Turgor intact.  PSYCHIATRIC: Mood, affect within normal limits. The patient is awake, alert and oriented x 3. Insight, judgment intact.       IMAGING    CT ABDOMEN PELVIS WO CONTRAST  Result Date: 11/05/2021 CLINICAL DATA:  Sepsis, fever, postoperative, rule out intra-abdominal abscess EXAM: CT ABDOMEN AND PELVIS WITHOUT CONTRAST TECHNIQUE:  Multidetector CT imaging of the abdomen and pelvis was performed following the standard protocol without IV contrast. Oral enteric contrast was administered. RADIATION DOSE REDUCTION: This exam was performed according to the departmental dose-optimization program which includes automated exposure control, adjustment of the mA and/or kV according to patient size and/or use of iterative reconstruction technique. COMPARISON:  10/27/2021 FINDINGS: Lower chest: Small right pleural effusion associated atelectasis or consolidation. Three-vessel coronary artery calcifications. Hepatobiliary: No solid liver abnormality is seen. No gallstones, gallbladder wall thickening, or biliary dilatation. Pancreas: Unremarkable. No pancreatic ductal dilatation or surrounding inflammatory changes. Spleen: Normal in size without significant abnormality. Adrenals/Urinary Tract: Adrenal glands are unremarkable. Kidneys are normal, without renal calculi, solid lesion, or hydronephrosis. Bladder is unremarkable. Stomach/Bowel: Stomach is within normal limits. The mid small bowel is diffusely gas and contrast filled, and mildly distended, largest loops measuring 4.1 cm. Status post right hemicolectomy. Descending and sigmoid diverticula. Vascular/Lymphatic: Aortic atherosclerosis. No enlarged abdominal or pelvic lymph nodes. Reproductive: No mass or other significant abnormality. Other: Anasarca. Persistent, although improved, scattered subcutaneous emphysema about the abdominal wall (series 2, image 38). Unchanged subcutaneous fluid collections of the left and right ventral abdomen, collection on the left measuring 3.4 x 2.2 cm (series 2, image 36), collection on the right measuring 3.5 x 2.0 cm (series 2, image 43). No ascites. Musculoskeletal: No acute or significant osseous findings. IMPRESSION: 1. Unchanged subcutaneous fluid collections of the left and right ventral abdomen, collection on the left measuring 3.4 x 2.2 cm, collection on  the right measuring 3.5 x 2.0 cm. These most likely reflect postoperative hematoma or seroma, although presence or absence of infection within this fluid is not established by CT. 2. Status post right hemicolectomy. 3. The mid small bowel is diffusely gas  and contrast filled, and mildly distended, largest loops measuring 4.1 cm. Findings are most consistent with postoperative ileus. 4. Right pleural effusion and anasarca. 5. Coronary artery disease. Aortic Atherosclerosis (ICD10-I70.0). Electronically Signed   By: Delanna Ahmadi M.D.   On: 11/05/2021 17:34   CT ABDOMEN PELVIS WO CONTRAST  Result Date: 10/27/2021 CLINICAL DATA:  Postop day 4 from ileostomy reversal. Vomiting. Evaluate for anastomotic leak. EXAM: CT ABDOMEN AND PELVIS WITHOUT CONTRAST TECHNIQUE: Multidetector CT imaging of the abdomen and pelvis was performed following the standard protocol without IV contrast. RADIATION DOSE REDUCTION: This exam was performed according to the departmental dose-optimization program which includes automated exposure control, adjustment of the mA and/or kV according to patient size and/or use of iterative reconstruction technique. COMPARISON:  Abdominopelvic CT 11/05/2020.  Radiographs 10/26/2021. FINDINGS: Study was performed with the patient in the right lateral decubitus position. Rectal contrast was administered. Lower chest: Apparent right middle lobe collapse with a small amount of adjacent pleural fluid, incompletely visualized. Mild dependent atelectasis in both lower lobes. There is atherosclerosis of the aorta and coronary arteries. Hepatobiliary: Stable low-density liver lesions, likely cysts based on stability. Stable mild biliary dilatation status post cholecystectomy. Pancreas: Unremarkable. No pancreatic ductal dilatation or surrounding inflammatory changes. Spleen: Normal in size without focal abnormality. Adrenals/Urinary Tract: Both adrenal glands appear normal. Both kidneys demonstrate mild  perinephric soft tissue stranding, similar to previous study. No evidence of urinary tract calculus or hydronephrosis. The bladder appears unremarkable for its degree of distention. Stomach/Bowel: No oral contrast was administered. Rectal contrast has passed retrograde through the colon and through the ileocolonic anastomosis at the level of the mid transverse colon status post right hemicolectomy. There is retrograde filling of the distal small bowel. No anastomotic leak. The stomach and small bowel are fluid-filled and mildly distended. No evidence of bowel wall thickening or pneumatosis. Vascular/Lymphatic: There are no enlarged abdominal or pelvic lymph nodes. Diffuse aortic and branch vessel atherosclerosis. Reproductive: Stable appearance of the uterus and adnexa. No adnexal mass. Tubal ligation clips noted. Other: As seen on the postoperative radiographs, there is a large amount of soft tissue emphysema throughout the anterior abdominal wall which extends from the perineum into the presternal soft tissues. There are small focal fluid collections within the right and left anterior abdominal wall, likely related to recent laparoscopic procedure. No free intraperitoneal air, ascites or focal extraluminal fluid collections identified within the peritoneal cavity. Musculoskeletal: No acute or significant osseous findings. Stable chronic mild superior endplate compression fractures at L3 and L5. IMPRESSION: 1. Status post recent ileostomy reversal and ileocolonic anastomosis. The anastomosis is patent, without leak. 2. Nonspecific gastric and proximal small bowel distension without focal transition point, likely related to a postoperative ileus. Recommend radiographic follow-up. Patient may benefit from a nasogastric tube. 3. As seen on postoperative radiographs, there is extensive soft tissue emphysema throughout the abdominal wall which is likely related to the laparoscopic procedure. Soft tissue infection should  be excluded clinically. Small fluid collections in the anterior abdominal wall are attributed to the procedure, and no unexpected collections are identified. There is no pneumoperitoneum or abnormal intra-abdominal fluid collection. 4. Atelectasis at both lung bases with apparent partial right middle lobe collapse. 5. Extensive Aortic Atherosclerosis (ICD10-I70.0). Electronically Signed   By: Richardean Sale M.D.   On: 10/27/2021 16:28   DG Chest 1 View  Result Date: 10/26/2021 CLINICAL DATA:  Hypotension EXAM: CHEST  1 VIEW COMPARISON:  11/16/2020, 06/05/2020, PET CT 12/01/2019, CT chest 11/06/2019, 11/06/2019  FINDINGS: Mild cardiomegaly with central vascular congestion. Small pleural effusions and diffuse increased interstitial opacity likely due to edema. Aortic atherosclerosis. No pneumothorax. Irregular opacity with mild distortion and nodularity in the right upper lobe and right hilus similar compared to intermittent previous exams and presumably due to scarring. IMPRESSION: 1. Cardiomegaly with vascular congestion, small pleural effusions and mild diffuse interstitial edema 2. Irregular and nodular opacities in the right hilus and upper lobe present on previous exams and probably due to scarring Electronically Signed   By: Donavan Foil M.D.   On: 10/26/2021 17:54   DG Chest 2 View  Result Date: 11/18/2021 CLINICAL DATA:  Admitted for acute kidney injury, continued fatigue. Recent pleural effusion on RIGHT. EXAM: CHEST - 2 VIEW COMPARISON:  Chest x-rays dated 11/16/2021, 11/14/2021, 01/20/2020 and 07/31/2020. FINDINGS: Heart size and mediastinal contours are stable. Chronic scarring is again within the RIGHT perihilar lung. Coarse lung markings are seen throughout both lungs. No confluent opacities to suggest superimposed pneumonia. Small bilateral pleural effusions, RIGHT greater than LEFT. No pneumothorax is seen. RIGHT-sided PICC line appears well positioned with tip at the level of the lower  SVC/cavoatrial junction. No acute or suspicious osseous abnormality. Extensive aortic atherosclerosis. Mildly distended gas-filled loops of small bowel within the upper abdomen, incompletely imaged. IMPRESSION: 1. Small bilateral pleural effusions, RIGHT greater than LEFT. 2. Coarse lung markings throughout both lungs, compatible with chronic interstitial lung disease/fibrosis. No evidence of superimposed pneumonia or pulmonary edema. 3. Aortic atherosclerosis. 4. Suspect continued postoperative ileus with mildly distended gas-filled loops of small bowel partially imaged within the upper abdomen. Electronically Signed   By: Franki Cabot M.D.   On: 11/18/2021 12:32   DG Chest 2 View  Result Date: 11/09/2021 CLINICAL DATA:  Shortness of breath.  Mild chest pain.  Smoker. EXAM: CHEST - 2 VIEW COMPARISON:  11/05/2021 FINDINGS: Right PICC tip in the region of the inferior aspect of the superior vena cava without significant change. The nasogastric tube has been removed. Normal sized heart. Stable linear scarring in the right upper lobe. No significant change in a small right pleural effusion and right basilar airspace opacity. Clear left lung. Mildly tortuous and calcified thoracic aorta. No acute bony abnormality. IMPRESSION: Stable small right pleural effusion and right basilar atelectasis and/or pneumonia. Electronically Signed   By: Claudie Revering M.D.   On: 11/09/2021 10:17   DG Abd 1 View  Result Date: 11/03/2021 CLINICAL DATA:  NG tube placement EXAM: ABDOMEN - 1 VIEW COMPARISON:  11/03/2021 11:05 a.m. FINDINGS: Advancement of NG tube with tip and side port below the GE junction. Redemonstrated small bowel dilatation, not significantly changed from the prior exam. The stomach is not distended. The lower abdomen is not included for evaluation. IMPRESSION: Interval advancement of nasogastric tube, with tip and side port overlying the stomach. Overall unchanged small bowel dilatation in the imaged portion of  the abdomen. Electronically Signed   By: Merilyn Baba M.D.   On: 11/03/2021 16:01   DG Abd 1 View  Result Date: 10/28/2021 CLINICAL DATA:  NG tube placement EXAM: ABDOMEN - 1 VIEW COMPARISON:  10/26/2021 FINDINGS: Limited radiograph of the lower chest and upper abdomen was obtained for the purposes of enteric tube localization. Enteric tube is seen coursing below the diaphragm with distal tipterminating within the proximal stomach. Side port terminates within the distal esophagus. Similar degree of gaseous distension of large and small bowel within the visualized abdomen. IMPRESSION: Enteric tube with distal tip terminating within the  proximal stomach, side port terminating within the distal esophagus. Recommend advancement 7-10 cm. Electronically Signed   By: Davina Poke D.O.   On: 10/28/2021 15:48   DG Abd 1 View  Result Date: 10/26/2021 CLINICAL DATA:  Hypotension history of ileostomy take down EXAM: ABDOMEN - 1 VIEW COMPARISON:  11/05/2020 FINDINGS: Clips in the pelvis. Moderate air distension of the stomach. Air-filled dilated small bowel up to 3.5 cm in the central abdomen. Extensive mottled appearance of the abdominal soft tissues presumably due to soft tissue emphysema related to recent laparoscopic procedure. IMPRESSION: 1. Air distension of stomach and small bowel either due to ileus or possible obstruction. 2. Diffuse mottled appearance of soft tissues felt consistent with soft tissue emphysema likely due to recent laparoscopic procedure Electronically Signed   By: Donavan Foil M.D.   On: 10/26/2021 17:51   CT CHEST WO CONTRAST  Result Date: 11/22/2021 CLINICAL DATA:  Pneumonia. Pleural effusion suspected. Evaluate lungs for fluid. EXAM: CT CHEST WITHOUT CONTRAST TECHNIQUE: Multidetector CT imaging of the chest was performed following the standard protocol without IV contrast. RADIATION DOSE REDUCTION: This exam was performed according to the departmental dose-optimization program which  includes automated exposure control, adjustment of the mA and/or kV according to patient size and/or use of iterative reconstruction technique. COMPARISON:  AP chest 11/21/2021; chest two views 11/18/2021; CT chest 11/06/2019 and 11/19/2018 FINDINGS: Cardiovascular: Heart size is moderately enlarged similar to prior. Mild pericardial effusion is new from 11/06/2019. Dense coronary artery calcifications are seen. The ascending aorta measures up to 3.4 cm in caliber, ectatic but not aneurysmal. Moderate to high-grade atherosclerotic calcifications of the thoracic aorta. Mediastinum/Nodes: No axillary, mediastinal, or hilar pathologically enlarged lymph nodes by CT criteria. Visualized thyroid gland is grossly unremarkable. The esophagus follows a normal course of normal caliber. Lungs/Pleura: The central airways are patent. Moderate to high-grade centrilobular emphysematous changes are again seen. Moderate apical paraseptal emphysematous changes. A right upper lobe opacity on the prior CT series 6, image 21 appears to have resolved. Contiguous more inferior right upper lobe more superior (axial image 56 and sagittal image 51) and inferior (axial image 68 and sagittal image 56) pulmonary opacities extending to the right hilum appears similar to prior with the more inferior opacity that contacts the anterior lung subjectively appearing thinner and with "less fullness" (axial images 62 through 70). Benign partially calcified 3 mm granuloma within the medial left lower lobe (axial series 3, image 69).a mild anterior left upper lobe subpleural thickening is unchanged (axial series 3, image 70). Moderate to high-grade right and mild to moderate left pleural effusions, increased from 11/06/2019. Upper Abdomen: Unchanged fluid density possible 11 mm cyst within the anterior right hepatic lobe. Musculoskeletal: Mild-to-moderate multilevel degenerative disc changes of the thoracic spine. Minimal dextrocurvature of the  midthoracic spine. Old healed anterior left second rib fracture. IMPRESSION:: IMPRESSION: 1. Moderate cardiomegaly, unchanged.  New mild pericardial effusion. 2. Coronary artery calcifications, an indicator of coronary artery disease. 3. Moderate to high-grade centrilobular emphysematous changes. 4. Moderate to high-grade right and mild-to-moderate left pleural effusions, increased from 11/06/2019. 5. Resolution of a superior right upper lobe opacity seen on prior 11/06/2019 CT. 6. 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 Aortic Atherosclerosis (ICD10-I70.0) and Emphysema (ICD10-J43.9). Electronically Signed   By: Yvonne Kendall M.D.   On: 11/22/2021 10:22   US RENAL  Result Date: 10/25/2021 CLINICAL DATA:  Acute  kidney injury EXAM: RENAL / URINARY TRACT ULTRASOUND COMPLETE COMPARISON:  CT 11/05/2020 FINDINGS: Right Kidney: Renal measurements: 8.9 x 4.1 x 4.2 cm = volume: 79.8 mL. Cortex slightly echogenic. No mass or hydronephrosis Left Kidney: Renal measurements: 7.5 x 4.4 x 4 cm = volume: 88.5 mL. Cortex slightly echogenic. No mass or hydronephrosis. Bladder: Appears normal for degree of bladder distention. Other: None. IMPRESSION: Slight increased cortical echogenicity suggesting medical renal disease. No hydronephrosis. Electronically Signed   By: Donavan Foil M.D.   On: 10/25/2021 16:28   DG Chest Port 1 View  Result Date: 11/22/2021 CLINICAL DATA:  Status post right thoracentesis. EXAM: PORTABLE CHEST 1 VIEW COMPARISON:  Earlier today. FINDINGS: Interval decrease in right basilar pleural fluid with no pneumothorax seen. Resolved atelectasis at the right lung base. Stable minimal left pleural effusion. Stable enlarged cardiac silhouette, tortuous and calcified thoracic aorta and linear scarring in the right upper lung zone. Right PICC tip in the region of the superior  cavoatrial junction/inferior aspect of the superior vena cava. Mild increase in mild linear atelectasis at the medial left lung base. No acute bony abnormality. IMPRESSION: 1. Decreased right pleural fluid without pneumothorax following thoracentesis. 2. Resolved right basilar atelectasis. 3. Mild increase in mild left lower lobe atelectasis. 4. Minimal left pleural effusion, small residual right pleural effusion and stable right upper lung zone scarring. Electronically Signed   By: Claudie Revering M.D.   On: 11/22/2021 16:42   DG Chest Port 1 View  Result Date: 11/21/2021 CLINICAL DATA:  Pulmonary edema, COPD EXAM: PORTABLE CHEST 1 VIEW COMPARISON:  11/18/2021 FINDINGS: Stable positioning of a right-sided PICC line. Stable mild cardiomegaly. Aortic atherosclerosis. Chronic scarring in the right perihilar region. Increasing right lower lobe airspace opacity. Small right-sided pleural effusion and trace left pleural effusion. No pneumothorax. IMPRESSION: 1. Worsening right lower lobe airspace opacity, suspicious for pneumonia. 2. Small right and trace left pleural effusions. Electronically Signed   By: Davina Poke D.O.   On: 11/21/2021 13:11   DG Chest Port 1 View  Result Date: 11/16/2021 CLINICAL DATA:  Shortness of breath EXAM: PORTABLE CHEST 1 VIEW COMPARISON:  Previous studies including the examination of 11/14/2021 FINDINGS: Transverse diameter of heart is increased. Central pulmonary vessels are prominent. Linear densities in the right parahilar region have not changed significantly. Small bilateral pleural effusions are seen, more so on the right side. Crowding of markings in the medial lower lung fields suggest atelectasis/pneumonia. There is no pneumothorax. Tip of PICC line is seen in superior vena cava close to right atrium. IMPRESSION: Cardiomegaly. Central pulmonary vessels are prominent. Prominence of interstitial markings in the right parahilar region and both lower lung fields may suggest  interstitial edema or pneumonia. Small bilateral pleural effusions, more so on the right side. Electronically Signed   By: Elmer Picker M.D.   On: 11/16/2021 08:13   DG Chest Port 1 View  Result Date: 11/14/2021 CLINICAL DATA:  Atelectasis, history of small-bowel obstruction EXAM: PORTABLE CHEST 1 VIEW COMPARISON:  11/13/2021 FINDINGS: No significant change in AP portable chest radiograph, with cardiomegaly and right upper extremity PICC. Diffuse bilateral interstitial pulmonary opacity, small bilateral pleural effusions, and bandlike scarring or atelectasis of the right midlung, unchanged. IMPRESSION: No significant change in AP portable chest radiograph, with cardiomegaly, probable edema, and bilateral pleural effusions. No new airspace opacity. Electronically Signed   By: Delanna Ahmadi M.D.   On: 11/14/2021 09:58   DG Chest Port 1 View  Result Date: 11/13/2021 CLINICAL  DATA:  Post right thoracentesis EXAM: PORTABLE CHEST 1 VIEW COMPARISON:  Chest x-ray dated November 12, 2021 FINDINGS: Cardiac and mediastinal contours are unchanged. Linear nodular opacity of the right upper lobe, unchanged from prior exam. No new parenchymal opacity. Interval decreased size of small right pleural effusion. No evidence pneumothorax. IMPRESSION: No evidence of pneumothorax. Electronically Signed   By: Yetta Glassman M.D.   On: 11/13/2021 11:46   DG Chest Port 1 View  Result Date: 11/12/2021 CLINICAL DATA:  Acute respiratory failure with hypoxia. EXAM: PORTABLE CHEST 1 VIEW COMPARISON:  November 09, 2021 FINDINGS: Right upper extremity PICC with tip near the superior cavoatrial junction. The heart size and mediastinal contours are within normal limits. Stable linear scarring in the right upper lobe. Slightly increased small right pleural effusion with adjacent airspace opacities. Increased conspicuity of the irregular nodular areas of consolidation in the right mid lung. The visualized skeletal structures are  unremarkable. IMPRESSION: 1. Slightly increased small right pleural effusion with adjacent airspace opacities. 2. Increased conspicuity of the irregular nodular areas of consolidation in the right mid lung. Electronically Signed   By: Dahlia Bailiff M.D.   On: 11/12/2021 08:55   DG Chest Port 1 View  Result Date: 11/05/2021 CLINICAL DATA:  Fever. EXAM: PORTABLE CHEST 1 VIEW COMPARISON:  Chest radiograph 10/26/2021. FINDINGS: Right upper extremity PICC line tip projects over the superior vena cava. Enteric tube is present. The side port is at the level of the distal esophagus. Stable enlarged cardiac and mediastinal contours. Similar-appearing distortion and consolidation within the mid right lung. Bibasilar atelectasis, right-greater-than-left. Probable small right pleural effusion. No pneumothorax. IMPRESSION: Enteric tube side-port at the distal esophagus, recommend advancement. Right upper extremity PICC line tip projects over the superior vena cava. Similar-appearing irregular and nodular areas of consolidation within the right mid lung. These results will be called to the ordering clinician or representative by the Radiologist Assistant, and communication documented in the PACS or [employer]. Electronically Signed   By: Lovey Newcomer M.D.   On: 11/05/2021 08:04   DG Abd 2 Views  Result Date: 11/06/2021 CLINICAL DATA:  Abdominal pain EXAM: ABDOMEN - 2 VIEW COMPARISON:  Abdominal x-ray 11/03/2021 FINDINGS: Enteric tube identified with the tip in the lower esophagus. Mildly distended loops of small bowel are again seen throughout the abdomen measuring up to 3.3 cm in diameter. No free air identified on the decubitus view. No suspicious calcifications identified. IMPRESSION: 1. Enteric tube tip is in the lower esophagus, advancement recommended. 2. Mildly distended loops of bowel throughout the abdomen which could be seen with ileus or obstruction. Continued follow-up recommended. Electronically Signed    By: Ofilia Neas M.D.   On: 11/06/2021 10:42   DG Abd 2 Views  Result Date: 11/03/2021 CLINICAL DATA:  Abdominal distention EXAM: ABDOMEN - 2 VIEW COMPARISON:  Previous studies including the examination of 11/02/2021 FINDINGS: There is interval worsening of small bowel dilation measuring up to 4.9 cm in diameter. Tip enteric tube is noted at the gastroesophageal junction pointing cephalad. There is no distention of stomach. There is interval passage of contrast from the colon. There is evidence of previous tubal ligation. IMPRESSION: There is interval increase in small bowel dilation suggesting high-grade small bowel obstruction. Tip of enteric tube is at the gastroesophageal junction pointing cephalad. NG tube should be advanced 5-10 cm to place the tip and side port within the stomach. Electronically Signed   By: Elmer Picker M.D.   On: 11/03/2021 12:36  DG Abd 2 Views  Result Date: 10/31/2021 CLINICAL DATA:  Ileus following gastrointestinal surgery, colostomy takedown 10/23/2021 EXAM: ABDOMEN - 2 VIEW COMPARISON:  10/28/2021 FINDINGS: Nasogastric tube in proximal stomach. Persistent dilatation of small bowel loops in the abdomen without wall thickening. Small amount retained contrast in colon. Levoconvex lumbar scoliosis. Foci of soft tissue gas at RIGHT lateral abdominal wall consistent with preceding surgery. Atelectasis at RIGHT lung base. IMPRESSION: Persistent small bowel ileus. Electronically Signed   By: Lavonia Dana M.D.   On: 10/31/2021 10:59   DG Abd Portable 1V-Small Bowel Obstruction Protocol-initial, 8 hr delay  Result Date: 11/02/2021 CLINICAL DATA:  8 hour follow-up small-bowel delay EXAM: PORTABLE ABDOMEN - 1 VIEW COMPARISON:  Film from the previous day. FINDINGS: Gastric catheter is noted within the stomach. Contrast material administered now lies within the dilated small bowel as well as within the colon. These changes are consistent with a partial small bowel  obstruction. No free air is noted. IMPRESSION: Passage of contrast into the colon consistent with a partial small bowel obstruction. Electronically Signed   By: Inez Catalina M.D.   On: 11/02/2021 03:13   DG Abd Portable 1V  Result Date: 11/01/2021 CLINICAL DATA:  Enteric catheter placement EXAM: PORTABLE ABDOMEN - 1 VIEW COMPARISON:  10/31/2021 FINDINGS: Frontal view of the lower chest and upper abdomen demonstrates enteric catheter tip and side port projecting over the gastric fundus. Right-sided PICC tip overlies atriocaval junction. Cardiac silhouette is enlarged. Small right pleural effusion. Stable vascular congestion. Stable distended gas-filled loops of small bowel, likely ileus given recent surgery. IMPRESSION: 1. Enteric catheter projecting over gastric fundus. 2. Stable small bowel dilatation consistent with ileus. Electronically Signed   By: Randa Ngo M.D.   On: 11/01/2021 16:02   ECHOCARDIOGRAM COMPLETE  Result Date: 10/27/2021    ECHOCARDIOGRAM REPORT   Patient Name:   Danielle [name] Date of Exam: 10/27/2021 Medical Rec #:  [id number]       Height:       66.0 in Accession #:    [id number]      Weight:       124.0 lb Date of Birth:  02-12-1956       BSA:          75.632 m Patient Age:    31 years        BP:           144/84 mmHg Patient Gender: F               HR:           95 bpm. Exam Location:  [facility] Procedure: 2D Echo, Cardiac Doppler and Color Doppler Indications:     Elevated troponin  History:         Patient has prior history of Echocardiogram examinations, most                  recent 11/07/2019. COPD, Arrythmias:Atrial Fibrillation,                  Signs/Symptoms:Murmur; Risk Factors:Hypertension.  Sonographer:     Sherrie Sport Referring Phys:  [place] Diagnosing Phys: Donnelly Angelica IMPRESSIONS  1. Left ventricular ejection fraction, by estimation, is 60 to 65%. The left ventricle has normal function. The left ventricle has no regional wall motion abnormalities. Left  ventricular diastolic parameters are consistent with Grade I diastolic dysfunction (impaired relaxation).  2. Right ventricular systolic function is normal. The right ventricular size  is mildly enlarged.  3. Right atrial size was mildly dilated.  4. The mitral valve is normal in structure. Moderate mitral valve regurgitation. No evidence of mitral stenosis.  5. Tricuspid valve regurgitation is moderate.  6. The aortic valve is calcified. Aortic valve regurgitation is not visualized. Mild aortic valve stenosis.  7. The inferior vena cava is normal in size with greater than 50% respiratory variability, suggesting right atrial pressure of 3 mmHg. Conclusion(s)/Recommendation(s): CALCIFIED AORTIC VALVE WITH MILD AS. VMax 2.1 m/s. Dimensionless index 0.4. FINDINGS  Left Ventricle: Left ventricular ejection fraction, by estimation, is 60 to 65%. The left ventricle has normal function. The left ventricle has no regional wall motion abnormalities. The left ventricular internal cavity size was normal in size. There is  no left ventricular hypertrophy. Left ventricular diastolic parameters are consistent with Grade I diastolic dysfunction (impaired relaxation). Right Ventricle: The right ventricular size is mildly enlarged. No increase in right ventricular wall thickness. Right ventricular systolic function is normal. Left Atrium: Left atrial size was normal in size. Right Atrium: Right atrial size was mildly dilated. Pericardium: There is no evidence of pericardial effusion. Mitral Valve: The mitral valve is normal in structure. Moderate mitral valve regurgitation. No evidence of mitral valve stenosis. MV peak gradient, 3.1 mmHg. The mean mitral valve gradient is 1.0 mmHg. Tricuspid Valve: The tricuspid valve is normal in structure. Tricuspid valve regurgitation is moderate . No evidence of tricuspid stenosis. Aortic Valve: The aortic valve is calcified. Aortic valve regurgitation is not visualized. Mild aortic stenosis is  present. Aortic valve mean gradient measures 9.4 mmHg. Aortic valve peak gradient measures 16.8 mmHg. Aortic valve area, by VTI measures 1.21 cm. Pulmonic Valve: The pulmonic valve was not well visualized. Pulmonic valve regurgitation is not visualized. No evidence of pulmonic stenosis. Aorta: The aortic root is normal in size and structure. Venous: The inferior vena cava is normal in size with greater than 50% respiratory variability, suggesting right atrial pressure of 3 mmHg. IAS/Shunts: No atrial level shunt detected by color flow Doppler.  LEFT VENTRICLE PLAX 2D LVIDd:         4.80 cm   Diastology LVIDs:         3.00 cm   LV e' medial:    5.87 cm/s LV PW:         1.10 cm   LV E/e' medial:  13.9 LV IVS:        0.75 cm   LV e' lateral:   12.40 cm/s LVOT diam:     2.00 cm   LV E/e' lateral: 6.6 LV SV:         45 LV SV Index:   28 LVOT Area:     3.14 cm  RIGHT VENTRICLE RV Basal diam:  3.80 cm RV S prime:     12.20 cm/s TAPSE (M-mode): 1.8 cm LEFT ATRIUM             Index        RIGHT ATRIUM           Index LA diam:        3.20 cm 1.96 cm/m   RA Area:     19.30 cm LA Vol (A2C):   58.8 ml 36.03 ml/m  RA Volume:   63.90 ml  39.15 ml/m LA Vol (A4C):   38.7 ml 23.71 ml/m LA Biplane Vol: 49.1 ml 30.08 ml/m  AORTIC VALVE  PULMONIC VALVE AV Area (Vmax):    1.04 cm      PV Vmax:        0.59 m/s AV Area (Vmean):   0.91 cm      PV Vmean:       35.200 cm/s AV Area (VTI):     1.21 cm      PV VTI:         0.109 m AV Vmax:           205.20 cm/s   PV Peak grad:   1.4 mmHg AV Vmean:          140.200 cm/s  PV Mean grad:   1.0 mmHg AV VTI:            0.373 m       RVOT Peak grad: 4 mmHg AV Peak Grad:      16.8 mmHg AV Mean Grad:      9.4 mmHg LVOT Vmax:         67.90 cm/s LVOT Vmean:        40.500 cm/s LVOT VTI:          0.144 m LVOT/AV VTI ratio: 0.39  AORTA Ao Root diam: 3.17 cm MITRAL VALVE               TRICUSPID VALVE MV Area (PHT): 4.86 cm    TR Peak grad:   52.4 mmHg MV Area VTI:   3.01 cm     TR Vmax:        362.00 cm/s MV Peak grad:  3.1 mmHg MV Mean grad:  1.0 mmHg    SHUNTS MV Vmax:       0.88 m/s    Systemic VTI:  0.14 m MV Vmean:      55.0 cm/s   Systemic Diam: 2.00 cm MV Decel Time: 156 msec    Pulmonic VTI:  0.157 m MV E velocity: 81.40 cm/s MV A velocity: 51.40 cm/s MV E/A ratio:  1.58 Donnelly Angelica Electronically signed by Donnelly Angelica Signature Date/Time: 10/27/2021/4:07:10 PM    Final    [place] EKG SITE RITE  Result Date: 11/01/2021 If Site Rite image not attached, placement could not be confirmed due to current cardiac rhythm.  US THORACENTESIS ASP PLEURAL SPACE W/IMG GUIDE  Result Date: 11/23/2021 INDICATION: Patient with history of CKD, COPD, admitted s/p ileostomy reversal now with persistent right pleural effusion. Request made for therapeutic right thoracentesis. EXAM: ULTRASOUND GUIDED RIGHT THORACENTESIS MEDICATIONS: 10 mL 1% lidocaine COMPLICATIONS: None immediate. PROCEDURE: An ultrasound guided thoracentesis was thoroughly discussed with the patient and questions answered. The benefits, risks, alternatives and complications were also discussed. The patient understands and wishes to proceed with the procedure. Written consent was obtained. Ultrasound was performed to localize and mark an adequate pocket of fluid in the right chest. The area was then prepped and draped in the normal sterile fashion. 1% Lidocaine was used for local anesthesia. Under ultrasound guidance a 6 Fr Safe-T-Centesis catheter was introduced. Thoracentesis was performed. The catheter was removed and a dressing applied. FINDINGS: A total of approximately 550 mL of amber fluid was removed. IMPRESSION: Successful ultrasound guided right therapeutic thoracentesis yielding 550 mL of pleural fluid. Read by: Brynda Greathouse PA-C Electronically Signed   By: Albin Felling M.D.   On: 11/23/2021 07:43   US THORACENTESIS ASP PLEURAL SPACE W/IMG GUIDE  Result Date: 11/13/2021 INDICATION: Right pleural effusion with  shortness breath request received for diagnostic and therapeutic thoracentesis. EXAM: ULTRASOUND  GUIDED RIGHT THORACENTESIS MEDICATIONS: Local 1% lidocaine only. COMPLICATIONS: None immediate. PROCEDURE: An ultrasound guided thoracentesis was thoroughly discussed with the patient and questions answered. The benefits, risks, alternatives and complications were also discussed. The patient understands and wishes to proceed with the procedure. Written consent was obtained. Ultrasound was performed to localize and mark an adequate pocket of fluid in the right chest. The area was then prepped and draped in the normal sterile fashion. 1% Lidocaine was used for local anesthesia. Under ultrasound guidance a 19 gauge, 7-cm, Yueh catheter was introduced. Thoracentesis was performed. The catheter was removed and a dressing applied. FINDINGS: A total of approximately 1 L of amber colored fluid was removed. Samples were sent to the laboratory as requested by the clinical team. IMPRESSION: Successful ultrasound guided right thoracentesis yielding 1 L of pleural fluid. This exam was performed by Tsosie Billing PA-C, and was supervised and interpreted by Dr. Denna Haggard. Electronically Signed   By: Albin Felling M.D.   On: 11/13/2021 12:05          ASSESSMENT/PLAN   Acute on chronic hypoxemic respiratory failure   - Due to moderate right pleural effusion with associated right lower lobe complete atelectasis   -s/p thoracentesis - 1 L - aspirated -                 -improved to 2L/min     Moderate pleural effusion   S/p aspiration - fluid pattern studies with chronic effusion lymphocyte predominant transudate.     Suspect transudative fluid due to CKD - Noted nephrology on case - appreciate input  -s/p thoracentesis - yellow fluid - previous non malignant effusion, repeat cytology in process   Hx of right lung and esophageal cancer  - CT chest with spiculated lesion above  Per medical oncology Dr B notes ## Right  middle lung nodule- s/p Bronch- atypical cells [[name]]; last CT May 2017 ; SEP 29th PET- ~2cm; low SUV. A. LUNG MASS, RIGHT; CT-GUIDED BIOPSY: - ADENOCARCINOMA, ACINAR AND PAPILLARY MORPHOLOGIES. S/p RT [finished Dec 2017] s/p RT [DEC 2017]; AUG 2019- PET-right middle lobe radiation changes no evidence of recurrence.   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  T1b- submucosal involvement; N0- stage I [[name]; [facility]]; unable to proceed with Endo mucosal resection-because of extensive disease [Dr.Branch; [facility]]; recommend definitive radiation [finished  RT- 09/21/2019]  Possible recurrence of cancer ? - will ask Dr B to eval on outpatient -she is known to his service- possible PET vs bx vs surveillance only  Right lower lobe atelectasis   - patient would benefit from recruitment maneuvers    - will order RT Metaneb with albuterol BID    - Incentive spirometry and flutter valve PRN   -s/p CXR with improvement   CKD   - likely culprit with fluid overload contributing to pulmonary and gut edema   -also differential is CHF vs malignant effusion  Previous speciment 11/14/20 CYTOLOGY - NON PAP  CASE: [id number]  PATIENT: Danielle [name]  Non-Gynecological Cytology Report      Specimen Submitted:  A. Pleural fluid, right   Clinical History: None provided       DIAGNOSIS:  A. PLEURAL FLUID; ULTRASOUND-GUIDED THORACENTESIS:  - NEGATIVE; NO EVIDENCE OF MALIGNANCY.  - MESOTHELIAL CELLS AND MIXED INFLAMMATORY CELLS.     Atrial fibrillation - chronic - potential for rapid ventricular  response due to albuterol/ipratropium therapy -continue amio 200 bid  -dcd cardizem and added metop succ bid 12.5    Thank you for allowing me to participate in the care of this patient.    Patient/Family are  satisfied with care plan and all questions have been answered.  This document was prepared using Dragon voice recognition software and may include unintentional dictation errors.     Ottie Glazier, M.D.  Division of Pulmonary & [place]         .fa

## 2021-11-24 NOTE — Progress Notes (Signed)
[facility]  PROGRESS NOTE   Subjective:   Patient seen ambulating in the room Oxygen weaned to 2L [place] Remains short of breath but improved since admission   Objective:  Vital signs in last 24 hours:  Temp:  [97.9 F (36.6 C)-98.6 F (37 C)] 97.9 F (36.6 C) (02/10 0753) Pulse Rate:  [67-75] 72 (02/10 0753) Resp:  [16] 16 (02/10 0340) BP: (91-143)/(58-71) 105/63 (02/10 0753) SpO2:  [98 %-99 %] 98 % (02/10 0753) Weight:  [56.3 kg] 56.3 kg (02/10 0500)  Weight change: -0.309 kg Filed Weights   11/20/21 0605 11/23/21 0500 11/24/21 0500  Weight: 60.5 kg 56.6 kg 56.3 kg    Intake/Output: I/O last 3 completed shifts: In: 360 [P.O.:360] Out: 0    Intake/Output this shift:  Total I/O In: 120 [P.O.:120] Out: -   Physical Exam: General:  No acute distress  Head:  Normocephalic, atraumatic. Moist oral mucosal membranes  Eyes:  Anicteric  Lungs:  Clear but diminished in bases, normal effort  Heart:  S1S2 no rubs  Abdomen:   Soft, nontender, bowel sounds present  Extremities:  No peripheral edema.  Neurologic:  Awake, alert, following commands  Skin:  No lesions       Basic Metabolic Panel: Recent Labs  Lab 11/18/21 0510 11/19/21 0905 11/22/21 0920 11/23/21 0625 11/24/21 0550  NA 138 134* 134* 134* 137  K 5.4* 4.6 3.6 3.9 3.7  CL 100 97* 94* 90* 94*  CO2 32 29 31 31  33*  GLUCOSE 96 85 123* 101* 82  BUN 23 23 26* 31* 37*  CREATININE 1.98* 2.15* 2.38* 2.33* 2.64*  CALCIUM 8.8* 8.9 9.2 9.1 9.1  MG 2.0  --   --  1.7 1.9  PHOS  --  4.6  --  4.6  --      CBC: Recent Labs  Lab 11/18/21 0510 11/21/21 0600 11/22/21 0920  WBC 8.8 6.0 8.2  HGB 7.9* 8.1* 9.2*  HCT 24.6* 25.9* 28.9*  MCV 94.6 94.9 94.8  PLT 337 335 373      Urinalysis: No results for input(s): COLORURINE, LABSPEC, PHURINE, GLUCOSEU, HGBUR, BILIRUBINUR, KETONESUR, PROTEINUR, UROBILINOGEN, NITRITE, LEUKOCYTESUR in the last 72 hours.  Invalid input(s): APPERANCEUR     Imaging: DG Chest Port 1 View  Result Date: 11/22/2021 CLINICAL DATA:  Status post right thoracentesis. EXAM: PORTABLE CHEST 1 VIEW COMPARISON:  Earlier today. FINDINGS: Interval decrease in right basilar pleural fluid with no pneumothorax seen. Resolved atelectasis at the right lung base. Stable minimal left pleural effusion. Stable enlarged cardiac silhouette, tortuous and calcified thoracic aorta and linear scarring in the right upper lung zone. Right PICC tip in the region of the superior cavoatrial junction/inferior aspect of the superior vena cava. Mild increase in mild linear atelectasis at the medial left lung base. No acute bony abnormality. IMPRESSION: 1. Decreased right pleural fluid without pneumothorax following thoracentesis. 2. Resolved right basilar atelectasis. 3. Mild increase in mild left lower lobe atelectasis. 4. Minimal left pleural effusion, small residual right pleural effusion and stable right upper lung zone scarring. Electronically Signed   By: Claudie Revering M.D.   On: 11/22/2021 16:42   US THORACENTESIS ASP PLEURAL SPACE W/IMG GUIDE  Result Date: 11/23/2021 INDICATION: Patient with history of CKD, COPD, admitted s/p ileostomy reversal now with persistent right pleural effusion. Request made for therapeutic right thoracentesis. EXAM: ULTRASOUND GUIDED RIGHT THORACENTESIS MEDICATIONS: 10 mL 1% lidocaine COMPLICATIONS: None immediate. PROCEDURE: An ultrasound guided thoracentesis was thoroughly discussed with the patient  and questions answered. The benefits, risks, alternatives and complications were also discussed. The patient understands and wishes to proceed with the procedure. Written consent was obtained. Ultrasound was performed to localize and mark an adequate pocket of fluid in the right chest. The area was then prepped and draped in the normal sterile fashion. 1% Lidocaine was used for local anesthesia. Under ultrasound guidance a 6 Fr Safe-T-Centesis catheter was  introduced. Thoracentesis was performed. The catheter was removed and a dressing applied. FINDINGS: A total of approximately 550 mL of amber fluid was removed. IMPRESSION: Successful ultrasound guided right therapeutic thoracentesis yielding 550 mL of pleural fluid. Read by: Brynda Greathouse PA-C Electronically Signed   By: Albin Felling M.D.   On: 11/23/2021 07:43     Medications:     amiodarone  200 mg Oral BID   apixaban  2.5 mg Oral BID   Chlorhexidine Gluconate Cloth  6 each Topical Daily   citalopram  40 mg Oral BH-q7a   famotidine  20 mg Oral Daily   feeding supplement (NEPRO CARB STEADY)  237 mL Oral TID BM   fluticasone  2 spray Each Nare Daily   loratadine  10 mg Oral Daily   metoprolol tartrate  12.5 mg Oral BID   midodrine  5 mg Oral TID WC   sodium chloride flush  10-40 mL Intracatheter Q12H   [START ON 11/25/2021] torsemide  20 mg Oral Daily   Vitamin D (Ergocalciferol)  50,000 Units Oral Q7 days    Assessment/ Plan:     Principal Problem:   Ileostomy status ([place]) Active Problems:   Hyponatremia   Acute respiratory failure with hypoxia (HCC)   Weakness   Acute kidney injury superimposed on CKD (HCC)   HTN (hypertension)   AF (paroxysmal atrial fibrillation) (HCC)   Normocytic anemia   Depression   Pleural effusion due to CHF (congestive heart failure) (HCC)   Hypokalemia   Acute on chronic combined systolic and diastolic CHF (congestive heart failure) (HCC)   Hyperphosphatemia   Hypomagnesemia   Anemia of chronic disease   Ileus following gastrointestinal surgery ([place])   Aspiration pneumonia of right lower lobe due to gastric secretions ([place])  66 year old female with history of hypertension, coronary artery disease, atrial fibrillation, COPD, chronic kidney disease stage IIIb and history of ileostomy now s/p ileostomy reversal. Postoperative course complicated by ileus development and acute kidney injury on the top of chronic kidney disease.  She also developed  anemia requiring 2 units of blood transfusion.  Patient is presently hemodynamically stable and is able to ambulate with assistance.   #1: Acute kidney injury on CKD:  Baseline Creatnine 1.25 / GFR  on 10/30/2021.  Creatinine slightly increased. Will recommend Torsemide 20mg  twice daily at discharge. Will schedule follow up appt with our office at discharge.     #2: Anemia: Patient may have anemia secondary to chronic kidney disease.  Patient has received blood transfusions during this admission.  Hgb 9.2   #3:  Status post elective ileostomy reversal complicated by ileus resulting in short-term TPN and NG tube.  Tolerating meals and pain managed   #4: Hypertension/CHF: Currently receiving low-dose midodrine with Torsemide.    LOS: [facility] 2/10/20232:41 PM

## 2021-11-24 NOTE — Plan of Care (Addendum)
Patient discharged to home after oxygen tank arrived and PICC removed. Discharge packet given

## 2021-11-24 NOTE — Discharge Summary (Signed)
Patient ID: Danielle [name] MRN: [id number] DOB/AGE: [birth year] 66 y.o.  Admit date: 10/23/2021 Discharge date: 11/24/2021   Discharge Diagnoses:  Principal Problem:   Ileostomy status ([place]) Active Problems:   Hyponatremia   Acute respiratory failure with hypoxia (HCC)   Weakness   Acute kidney injury superimposed on CKD (HCC)   HTN (hypertension)   AF (paroxysmal atrial fibrillation) (HCC)   Normocytic anemia   Depression   Pleural effusion due to CHF (congestive heart failure) (HCC)   Hypokalemia   Acute on chronic combined systolic and diastolic CHF (congestive heart failure) (HCC)   Hyperphosphatemia   Hypomagnesemia   Anemia of chronic disease   Ileus following gastrointestinal surgery ([place])   Aspiration pneumonia of right lower lobe due to gastric secretions [facility])   Procedures: Robotic assisted laparoscopic ileostomy reversal  Hospital Course: Patient admitted for elective robotic assisted laparoscopic ileostomy reversal.  Initially he tolerated the procedure well.  She eventually developed acute or chronic renal dysfunction.  She also developed prolonged postoperative ileus.  She was treated with medical management with nasogastric tube decompression, TPN, IV hydration, pain management.  Renal dysfunction treated with IV fluids.  She also developed intermittent atrial fibrillation exacerbations.  She also developed exacerbated pulmonary effusions.  The was treated with thoracentesis x2.  Finally she was able to be optimized increasing her blood pressure with midodrine and diuretics.  Finally patient has been ambulating with adequate O2 sat with oxygen supplementation at 2 L/min.  Her heart rate was stable during the last time she ambulated.  She finally made her go to go home today.  Physical Exam HENT:     Head: Normocephalic.     Nose: Nose normal.     Mouth/Throat:     Mouth: Mucous membranes are moist.  Cardiovascular:     Rate and Rhythm: Normal rate. Rhythm  irregular.     Pulses: Normal pulses.  Pulmonary:     Effort: Pulmonary effort is normal.     Breath sounds: Normal breath sounds.  Abdominal:     General: Abdomen is flat. Bowel sounds are normal.  Musculoskeletal:        General: Normal range of motion.     Cervical back: Normal range of motion.  Skin:    General: Skin is warm.     Capillary Refill: Capillary refill takes less than 2 seconds.  Neurological:     Mental Status: She is alert and oriented to person, place, and time.     Consults: Hospitalist                   Nephrology                   Pulmonology                   Cardiology  Disposition: Discharge disposition: 01-Home or Self Care        Allergies as of 11/24/2021   No Known Allergies      Medication List     STOP taking these medications    amLODipine 5 MG tablet Commonly known as: NORVASC   CALCIUM-VITAMIN D PO   carvedilol 6.25 MG tablet Commonly known as: COREG   multivitamin with minerals Tabs tablet       TAKE these medications    acetaminophen 325 MG tablet Commonly known as: TYLENOL Take 2 tablets (650 mg total) by mouth every 6 (six) hours as needed for mild pain (or Fever >/=  101). What changed:  when to take this reasons to take this   albuterol 108 (90 Base) MCG/ACT inhaler Commonly known as: VENTOLIN HFA Inhale 2 puffs into the lungs every 6 (six) hours as needed for wheezing or shortness of breath.   amiodarone 200 MG tablet Commonly known as: PACERONE Take 1 tablet (200 mg total) by mouth 2 (two) times daily.   apixaban 2.5 MG Tabs tablet Commonly known as: ELIQUIS Take 2.5 mg by mouth 2 (two) times daily.   calcium carbonate 500 MG chewable tablet Commonly known as: TUMS - dosed in mg elemental calcium Chew 1 tablet by mouth daily.   citalopram 40 MG tablet Commonly known as: CELEXA Take 40 mg by mouth every morning.   diltiazem 240 MG 24 hr capsule Commonly known as: CARDIZEM CD Take 1 capsule (240  mg total) by mouth daily.   ferrous sulfate 325 (65 FE) MG tablet Take 1 tablet (325 mg total) by mouth 2 (two) times daily with a meal.   Fluticasone-Umeclidin-Vilant 100-62.5-25 MCG/INH Aepb Inhale 1 puff into the lungs every morning.   loperamide 2 MG capsule Commonly known as: IMODIUM Take 2 mg by mouth as needed for diarrhea or loose stools.   metoprolol tartrate 25 MG tablet Commonly known as: LOPRESSOR Take 0.5 tablets (12.5 mg total) by mouth 2 (two) times daily.   midodrine 5 MG tablet Commonly known as: PROAMATINE Take 1 tablet (5 mg total) by mouth 3 (three) times daily with meals.   OXYGEN Inhale 2 L into the lungs as needed (shortness of breath).   pantoprazole 40 MG tablet Commonly known as: Protonix Take 1 tablet (40 mg total) by mouth 2 (two) times daily before a meal.   sucralfate 1 GM/10ML suspension Commonly known as: CARAFATE Take 10 mLs (1 g total) by mouth 4 (four) times daily. What changed:  when to take this reasons to take this   torsemide 20 MG tablet Commonly known as: DEMADEX Take 20 mg by mouth in the morning and at bedtime. What changed: Another medication with the same name was added. Make sure you understand how and when to take each.   torsemide 20 MG tablet Commonly known as: DEMADEX Take 1 tablet (20 mg total) by mouth daily. Start taking on: November 25, 2021 What changed: You were already taking a medication with the same name, and this prescription was added. Make sure you understand how and when to take each.   zolpidem 5 MG tablet Commonly known as: AMBIEN Take 5 mg by mouth at bedtime as needed for sleep.               Durable Medical Equipment  (From admission, onward)           Start     Ordered   11/19/21 1023  For home use only DME 3 n 1  Once        11/19/21 1023   11/17/21 0700  For home use only DME Walker rolling  Once       Question Answer Comment  Walker: Other   Comments 2 wheels   Patient needs  a walker to treat with the following condition Ambulatory dysfunction      11/17/21 0522            Follow-up Information     Herbert Pun, MD. Go on 12/14/2021.   Specialty: General Surgery Why: 9:45am appointment Contact information: [place] [address] [phone]  Gillis Santa, MD. Daphane Shepherd on 12/01/2021.   Specialty: Pain Medicine Why: Follow up chronic kidney disease ( will need a referral set over for this) Contact information: [place] [address] [phone]         Corey Skains, MD. Go on 12/01/2021.   Specialty: Cardiology Why: Follow up from hospital admission. New medications. Afib ( they will call the pt with an appointment) Contact information: [address] [facility] [place] [address] [phone]         Ottie Glazier, MD. Go on 11/30/2021.   Specialty: Pulmonary Disease Why: Follow up from hospital admission   3pm appointment Contact information: [place] [address] [phone]

## 2021-11-24 NOTE — TOC Transition Note (Addendum)
Transition of Care [facility]) - CM/SW Discharge Note   Patient Details  Name: Danielle [name] MRN: [id number] Date of Birth: 08/07/1956  Transition of Care [facility]) CM/SW Contact:  Candie Chroman, LCSW Phone Number: 11/24/2021, 8:41 AM   Clinical Narrative:   Patient has orders to discharge home today. No further concerns. CSW signing off.  10:05 am: Ordered oxygen tank through [employer] for ride home per patient request. Her daughter will take her home today. She confirmed she still does not want home health. CSW signing off.  Final next level of care: Home/Self Care Barriers to Discharge: Barriers Resolved   Patient Goals and CMS Choice        Discharge Placement                    Patient and family notified of of transfer: 11/24/21  Discharge Plan and Services                DME Arranged: 3-N-1 DME Agency: [employer] Date DME Agency Contacted: 11/19/21                Social Determinants of Health ([place]) Interventions     Readmission Risk Interventions Readmission Risk Prevention Plan 10/16/2019 09/28/2019 06/20/2019  Transportation Screening Complete Complete Complete  Medication Review [employer]) Referral to Pharmacy Complete Complete  [place] or Home Care Consult Complete Patient refused Complete  SW Recovery Care/Counseling Consult Complete Complete -  Palliative Care Screening Not Applicable Not Applicable Not [place] Not Applicable Not Applicable -  Some recent data might be hidden

## 2021-11-24 NOTE — Discharge Instructions (Addendum)
°  Diet: Resume home heart healthy regular diet.   Activity: No activity restriction as tolerated. Light activity and walking are encouraged. Do not drive or drink alcohol if taking narcotic pain medications.  Wound care: May shower with soapy water and pat dry (do not rub incisions), but no baths or submerging incision underwater until follow-up. (no swimming)   Medications: Resume all home medications with new adjustments as instructed in the new medication review. For mild to moderate pain: acetaminophen (Tylenol). Combining Tylenol with alcohol can substantially increase your risk of causing liver disease.   Call office [phone]) at any time if any questions, worsening pain, fevers/chills, bleeding, drainage from incision site, or other concerns.

## 2021-11-26 ENCOUNTER — Encounter: Payer: Self-pay | Admitting: Internal Medicine

## 2021-11-26 ENCOUNTER — Other Ambulatory Visit: Payer: Self-pay

## 2021-11-26 ENCOUNTER — Inpatient Hospital Stay
Admission: EM | Admit: 2021-11-26 | Discharge: 2021-11-29 | DRG: 872 | Disposition: A | Discharge status: Home / Self Care | Payer: Medicare Other | Attending: Student | Admitting: Student

## 2021-11-26 ENCOUNTER — Emergency Department: Payer: Medicare Other

## 2021-11-26 ENCOUNTER — Encounter: Payer: Self-pay | Admitting: Emergency Medicine

## 2021-11-26 DIAGNOSIS — I959 Hypotension, unspecified: Secondary | ICD-10-CM | POA: Diagnosis not present

## 2021-11-26 DIAGNOSIS — Z7901 Long term (current) use of anticoagulants: Secondary | ICD-10-CM

## 2021-11-26 DIAGNOSIS — R109 Unspecified abdominal pain: Secondary | ICD-10-CM | POA: Diagnosis not present

## 2021-11-26 DIAGNOSIS — K5792 Diverticulitis of intestine, part unspecified, without perforation or abscess without bleeding: Secondary | ICD-10-CM | POA: Diagnosis not present

## 2021-11-26 DIAGNOSIS — Z9981 Dependence on supplemental oxygen: Secondary | ICD-10-CM

## 2021-11-26 DIAGNOSIS — A0472 Enterocolitis due to Clostridium difficile, not specified as recurrent: Secondary | ICD-10-CM | POA: Diagnosis present

## 2021-11-26 DIAGNOSIS — I10 Essential (primary) hypertension: Secondary | ICD-10-CM | POA: Diagnosis not present

## 2021-11-26 DIAGNOSIS — E440 Moderate protein-calorie malnutrition: Secondary | ICD-10-CM | POA: Diagnosis present

## 2021-11-26 DIAGNOSIS — A4189 Other specified sepsis: Principal | ICD-10-CM | POA: Diagnosis present

## 2021-11-26 DIAGNOSIS — R1030 Lower abdominal pain, unspecified: Secondary | ICD-10-CM | POA: Diagnosis present

## 2021-11-26 DIAGNOSIS — Z85118 Personal history of other malignant neoplasm of bronchus and lung: Secondary | ICD-10-CM

## 2021-11-26 DIAGNOSIS — K219 Gastro-esophageal reflux disease without esophagitis: Secondary | ICD-10-CM | POA: Diagnosis present

## 2021-11-26 DIAGNOSIS — Z87891 Personal history of nicotine dependence: Secondary | ICD-10-CM

## 2021-11-26 DIAGNOSIS — N184 Chronic kidney disease, stage 4 (severe): Secondary | ICD-10-CM | POA: Diagnosis present

## 2021-11-26 DIAGNOSIS — I130 Hypertensive heart and chronic kidney disease with heart failure and stage 1 through stage 4 chronic kidney disease, or unspecified chronic kidney disease: Secondary | ICD-10-CM | POA: Diagnosis present

## 2021-11-26 DIAGNOSIS — Z923 Personal history of irradiation: Secondary | ICD-10-CM

## 2021-11-26 DIAGNOSIS — J9611 Chronic respiratory failure with hypoxia: Secondary | ICD-10-CM | POA: Diagnosis present

## 2021-11-26 DIAGNOSIS — Z9221 Personal history of antineoplastic chemotherapy: Secondary | ICD-10-CM

## 2021-11-26 DIAGNOSIS — Z9049 Acquired absence of other specified parts of digestive tract: Secondary | ICD-10-CM

## 2021-11-26 DIAGNOSIS — E559 Vitamin D deficiency, unspecified: Secondary | ICD-10-CM | POA: Diagnosis present

## 2021-11-26 DIAGNOSIS — I4892 Unspecified atrial flutter: Secondary | ICD-10-CM | POA: Diagnosis present

## 2021-11-26 DIAGNOSIS — I350 Nonrheumatic aortic (valve) stenosis: Secondary | ICD-10-CM | POA: Diagnosis present

## 2021-11-26 DIAGNOSIS — I480 Paroxysmal atrial fibrillation: Secondary | ICD-10-CM | POA: Diagnosis present

## 2021-11-26 DIAGNOSIS — D509 Iron deficiency anemia, unspecified: Secondary | ICD-10-CM | POA: Diagnosis present

## 2021-11-26 DIAGNOSIS — E871 Hypo-osmolality and hyponatremia: Secondary | ICD-10-CM | POA: Diagnosis present

## 2021-11-26 DIAGNOSIS — D539 Nutritional anemia, unspecified: Secondary | ICD-10-CM | POA: Diagnosis present

## 2021-11-26 DIAGNOSIS — I700 Atherosclerosis of aorta: Secondary | ICD-10-CM | POA: Diagnosis present

## 2021-11-26 DIAGNOSIS — Z6820 Body mass index (BMI) 20.0-20.9, adult: Secondary | ICD-10-CM

## 2021-11-26 DIAGNOSIS — F32A Depression, unspecified: Secondary | ICD-10-CM | POA: Diagnosis present

## 2021-11-26 DIAGNOSIS — Z7951 Long term (current) use of inhaled steroids: Secondary | ICD-10-CM

## 2021-11-26 DIAGNOSIS — R531 Weakness: Secondary | ICD-10-CM

## 2021-11-26 DIAGNOSIS — E876 Hypokalemia: Secondary | ICD-10-CM | POA: Diagnosis present

## 2021-11-26 DIAGNOSIS — Z20822 Contact with and (suspected) exposure to covid-19: Secondary | ICD-10-CM | POA: Diagnosis present

## 2021-11-26 DIAGNOSIS — F419 Anxiety disorder, unspecified: Secondary | ICD-10-CM | POA: Diagnosis present

## 2021-11-26 DIAGNOSIS — I5032 Chronic diastolic (congestive) heart failure: Secondary | ICD-10-CM | POA: Diagnosis present

## 2021-11-26 DIAGNOSIS — Z79899 Other long term (current) drug therapy: Secondary | ICD-10-CM

## 2021-11-26 DIAGNOSIS — Z9012 Acquired absence of left breast and nipple: Secondary | ICD-10-CM

## 2021-11-26 DIAGNOSIS — Z8616 Personal history of COVID-19: Secondary | ICD-10-CM | POA: Diagnosis not present

## 2021-11-26 DIAGNOSIS — E785 Hyperlipidemia, unspecified: Secondary | ICD-10-CM | POA: Diagnosis present

## 2021-11-26 DIAGNOSIS — A419 Sepsis, unspecified organism: Secondary | ICD-10-CM | POA: Diagnosis present

## 2021-11-26 DIAGNOSIS — J449 Chronic obstructive pulmonary disease, unspecified: Secondary | ICD-10-CM | POA: Diagnosis present

## 2021-11-26 DIAGNOSIS — Z853 Personal history of malignant neoplasm of breast: Secondary | ICD-10-CM

## 2021-11-26 DIAGNOSIS — Z803 Family history of malignant neoplasm of breast: Secondary | ICD-10-CM

## 2021-11-26 HISTORY — DX: Chronic diastolic (congestive) heart failure: I50.32

## 2021-11-26 LAB — URINALYSIS, ROUTINE W REFLEX MICROSCOPIC
Bacteria, UA: NONE SEEN
Bilirubin Urine: NEGATIVE
Glucose, UA: NEGATIVE mg/dL
Hgb urine dipstick: NEGATIVE
Ketones, ur: 5 mg/dL — AB
Nitrite: NEGATIVE
Protein, ur: NEGATIVE mg/dL
Specific Gravity, Urine: 1.011 (ref 1.005–1.030)
pH: 5 (ref 5.0–8.0)

## 2021-11-26 LAB — CBC
HCT: 36.4 % (ref 36.0–46.0)
Hemoglobin: 11.4 g/dL — ABNORMAL LOW (ref 12.0–15.0)
MCH: 30.2 pg (ref 26.0–34.0)
MCHC: 31.3 g/dL (ref 30.0–36.0)
MCV: 96.3 fL (ref 80.0–100.0)
Platelets: 361 10*3/uL (ref 150–400)
RBC: 3.78 MIL/uL — ABNORMAL LOW (ref 3.87–5.11)
RDW: 15.5 % (ref 11.5–15.5)
WBC: 16.5 10*3/uL — ABNORMAL HIGH (ref 4.0–10.5)
nRBC: 0 % (ref 0.0–0.2)

## 2021-11-26 LAB — PROTIME-INR
INR: 1.6 — ABNORMAL HIGH (ref 0.8–1.2)
Prothrombin Time: 18.7 seconds — ABNORMAL HIGH (ref 11.4–15.2)

## 2021-11-26 LAB — COMPREHENSIVE METABOLIC PANEL
ALT: 13 U/L (ref 0–44)
AST: 26 U/L (ref 15–41)
Albumin: 3.9 g/dL (ref 3.5–5.0)
Alkaline Phosphatase: 67 U/L (ref 38–126)
Anion gap: 15 (ref 5–15)
BUN: 41 mg/dL — ABNORMAL HIGH (ref 8–23)
CO2: 25 mmol/L (ref 22–32)
Calcium: 9.4 mg/dL (ref 8.9–10.3)
Chloride: 92 mmol/L — ABNORMAL LOW (ref 98–111)
Creatinine, Ser: 2.59 mg/dL — ABNORMAL HIGH (ref 0.44–1.00)
GFR, Estimated: 20 mL/min — ABNORMAL LOW (ref >60)
Glucose, Bld: 137 mg/dL — ABNORMAL HIGH (ref 70–99)
Potassium: 3.5 mmol/L (ref 3.5–5.1)
Sodium: 132 mmol/L — ABNORMAL LOW (ref 135–145)
Total Bilirubin: 0.9 mg/dL (ref 0.3–1.2)
Total Protein: 8.9 g/dL — ABNORMAL HIGH (ref 6.5–8.1)

## 2021-11-26 LAB — RESP PANEL BY RT-PCR (FLU A&B, COVID) ARPGX2
Influenza A by PCR: NEGATIVE
Influenza B by PCR: NEGATIVE
SARS Coronavirus 2 by RT PCR: NEGATIVE

## 2021-11-26 LAB — LACTIC ACID, PLASMA
Lactic Acid, Venous: 0.7 mmol/L (ref 0.5–1.9)
Lactic Acid, Venous: 0.9 mmol/L (ref 0.5–1.9)

## 2021-11-26 LAB — PROCALCITONIN: Procalcitonin: 0.18 ng/mL

## 2021-11-26 LAB — APTT: aPTT: 40 seconds — ABNORMAL HIGH (ref 24–36)

## 2021-11-26 LAB — C DIFFICILE QUICK SCREEN W PCR REFLEX
C Diff antigen: POSITIVE — AB
C Diff interpretation: DETECTED
C Diff toxin: POSITIVE — AB

## 2021-11-26 LAB — BRAIN NATRIURETIC PEPTIDE: B Natriuretic Peptide: 624.9 pg/mL — ABNORMAL HIGH (ref 0.0–100.0)

## 2021-11-26 LAB — LIPASE, BLOOD: Lipase: 23 U/L (ref 11–51)

## 2021-11-26 MED ORDER — PIPERACILLIN-TAZOBACTAM IN DEX 2-0.25 GM/50ML IV SOLN
2.2500 g | Freq: Three times a day (TID) | INTRAVENOUS | Status: DC
  Administered 2021-11-26: 2.25 g via INTRAVENOUS
  Filled 2021-11-26 (×4): qty 50

## 2021-11-26 MED ORDER — PANTOPRAZOLE SODIUM 40 MG PO TBEC
40.0000 mg | DELAYED_RELEASE_TABLET | Freq: Two times a day (BID) | ORAL | Status: DC
  Administered 2021-11-26 – 2021-11-29 (×6): 40 mg via ORAL
  Filled 2021-11-26 (×6): qty 1

## 2021-11-26 MED ORDER — APIXABAN 2.5 MG PO TABS
2.5000 mg | ORAL_TABLET | Freq: Two times a day (BID) | ORAL | Status: DC
  Administered 2021-11-26 – 2021-11-29 (×6): 2.5 mg via ORAL
  Filled 2021-11-26 (×7): qty 1

## 2021-11-26 MED ORDER — SODIUM CHLORIDE 0.9 % IV SOLN: INTRAVENOUS | Status: DC

## 2021-11-26 MED ORDER — MIDODRINE HCL 5 MG PO TABS
5.0000 mg | ORAL_TABLET | Freq: Three times a day (TID) | ORAL | Status: DC
  Administered 2021-11-27 (×2): 5 mg via ORAL
  Filled 2021-11-26 (×2): qty 1

## 2021-11-26 MED ORDER — FLUTICASONE FUROATE-VILANTEROL 100-25 MCG/ACT IN AEPB
1.0000 | INHALATION_SPRAY | Freq: Every day | RESPIRATORY_TRACT | Status: DC
  Administered 2021-11-27 – 2021-11-29 (×3): 1 via RESPIRATORY_TRACT
  Filled 2021-11-26: qty 28

## 2021-11-26 MED ORDER — FLUTICASONE-UMECLIDIN-VILANT 100-62.5-25 MCG/INH IN AEPB: 1.0000 | INHALATION_SPRAY | RESPIRATORY_TRACT | Status: DC

## 2021-11-26 MED ORDER — SODIUM CHLORIDE 0.9 % IV BOLUS
1000.0000 mL | Freq: Once | INTRAVENOUS | Status: AC
Start: 2021-11-26 — End: 2021-11-26
  Administered 2021-11-26: 1000 mL via INTRAVENOUS

## 2021-11-26 MED ORDER — MORPHINE SULFATE (PF) 2 MG/ML IV SOLN
2.0000 mg | INTRAVENOUS | Status: DC | PRN
  Administered 2021-11-26 – 2021-11-28 (×8): 2 mg via INTRAVENOUS
  Filled 2021-11-26 (×8): qty 1

## 2021-11-26 MED ORDER — ALBUTEROL SULFATE (2.5 MG/3ML) 0.083% IN NEBU: 3.0000 mL | INHALATION_SOLUTION | RESPIRATORY_TRACT | Status: DC | PRN

## 2021-11-26 MED ORDER — HYDRALAZINE HCL 20 MG/ML IJ SOLN: 5.0000 mg | INTRAMUSCULAR | Status: DC | PRN

## 2021-11-26 MED ORDER — ONDANSETRON HCL 4 MG/2ML IJ SOLN: 4.0000 mg | Freq: Three times a day (TID) | INTRAMUSCULAR | Status: DC | PRN

## 2021-11-26 MED ORDER — SUCRALFATE 1 GM/10ML PO SUSP
1.0000 g | Freq: Four times a day (QID) | ORAL | Status: DC | PRN
  Filled 2021-11-26: qty 10

## 2021-11-26 MED ORDER — DILTIAZEM HCL ER 60 MG PO CP12
240.0000 mg | ORAL_CAPSULE | Freq: Every day | ORAL | Status: DC
  Administered 2021-11-27: 240 mg via ORAL
  Filled 2021-11-26: qty 4

## 2021-11-26 MED ORDER — METOPROLOL TARTRATE 25 MG PO TABS
12.5000 mg | ORAL_TABLET | Freq: Two times a day (BID) | ORAL | Status: DC
  Administered 2021-11-26 – 2021-11-27 (×2): 12.5 mg via ORAL
  Filled 2021-11-26 (×2): qty 1

## 2021-11-26 MED ORDER — VANCOMYCIN HCL 125 MG PO CAPS
125.0000 mg | ORAL_CAPSULE | Freq: Four times a day (QID) | ORAL | Status: DC
  Administered 2021-11-26 – 2021-11-27 (×2): 125 mg via ORAL
  Filled 2021-11-26 (×2): qty 1

## 2021-11-26 MED ORDER — LOPERAMIDE HCL 2 MG PO CAPS: 2.0000 mg | ORAL_CAPSULE | ORAL | Status: DC | PRN

## 2021-11-26 MED ORDER — DM-GUAIFENESIN ER 30-600 MG PO TB12
1.0000 | ORAL_TABLET | Freq: Two times a day (BID) | ORAL | Status: DC | PRN
  Filled 2021-11-26: qty 1

## 2021-11-26 MED ORDER — AMIODARONE HCL 200 MG PO TABS
200.0000 mg | ORAL_TABLET | Freq: Two times a day (BID) | ORAL | Status: DC
  Administered 2021-11-26 – 2021-11-29 (×6): 200 mg via ORAL
  Filled 2021-11-26 (×6): qty 1

## 2021-11-26 MED ORDER — HYDROMORPHONE HCL 1 MG/ML IJ SOLN
0.5000 mg | Freq: Once | INTRAMUSCULAR | Status: AC
  Administered 2021-11-26: 0.5 mg via INTRAVENOUS
  Filled 2021-11-26: qty 1

## 2021-11-26 MED ORDER — ACETAMINOPHEN 325 MG PO TABS
650.0000 mg | ORAL_TABLET | Freq: Four times a day (QID) | ORAL | Status: DC | PRN
  Administered 2021-11-26 – 2021-11-29 (×5): 650 mg via ORAL
  Filled 2021-11-26 (×5): qty 2

## 2021-11-26 MED ORDER — TORSEMIDE 20 MG PO TABS
20.0000 mg | ORAL_TABLET | Freq: Every day | ORAL | Status: DC
  Administered 2021-11-27: 20 mg via ORAL
  Filled 2021-11-26: qty 1

## 2021-11-26 MED ORDER — CITALOPRAM HYDROBROMIDE 10 MG PO TABS
40.0000 mg | ORAL_TABLET | ORAL | Status: DC
  Administered 2021-11-27 – 2021-11-29 (×3): 40 mg via ORAL
  Filled 2021-11-26 (×3): qty 4

## 2021-11-26 MED ORDER — PIPERACILLIN-TAZOBACTAM 3.375 G IVPB 30 MIN
3.3750 g | Freq: Once | INTRAVENOUS | Status: AC
Start: 2021-11-26 — End: 2021-11-26
  Administered 2021-11-26: 3.375 g via INTRAVENOUS
  Filled 2021-11-26: qty 50

## 2021-11-26 MED ORDER — UMECLIDINIUM BROMIDE 62.5 MCG/ACT IN AEPB
1.0000 | INHALATION_SPRAY | Freq: Every day | RESPIRATORY_TRACT | Status: DC
  Administered 2021-11-27 – 2021-11-29 (×3): 1 via RESPIRATORY_TRACT
  Filled 2021-11-26: qty 7

## 2021-11-26 MED ORDER — CALCIUM CARBONATE ANTACID 500 MG PO CHEW: 1.0000 | CHEWABLE_TABLET | Freq: Every day | ORAL | Status: DC | PRN

## 2021-11-26 NOTE — Assessment & Plan Note (Signed)
Abdominal pain due to acute diverticulitis and sepsis: Patient meets criteria with WBC 16.5 NT And obese RR 22.  Pending lactic acid level.  Currently hemodynamically stable.  CT scan showed possible diverticulitis.  Dr. Terrence Dupont of general surgery is consulted, no acute surgical issues now.  -Admitted to telemetry bed as inpatient -IV Zosyn -Follow-up of blood culture -will get Procalcitonin and trend lactic acid levels per sepsis protocol. -IVF: 1L of NS bolus in ED -As needed morphine for pain, Zofran for nausea -Check C. difficile

## 2021-11-26 NOTE — ED Notes (Signed)
Pt presents to ED with c/o of lower BAD pain. Pt states this started 2 days ago after she had her ileostomy reversed. Pt also reports a bout of dark colored diarrhea. Pt states she lives home alone. Pt states she wears chronic O2. Pt is A&Ox4. Pt denies any dysuria or fevers or chills at this time. Pt denies N/V/D.

## 2021-11-26 NOTE — Assessment & Plan Note (Signed)
Stable -Bronchodilators 

## 2021-11-26 NOTE — Consult Note (Signed)
Pharmacy Antibiotic Note  Danielle [name] is a 66 y.o. female admitted on 11/26/2021 with diverticulitis.  Pharmacy has been consulted for zosyn dosing.  Plan: Zosyn 2.25 g IV q8h  Monitor clinical picture and renal function F/U C&S, abx deescalation / LOT   Temp (24hrs), Avg:98.5 F (36.9 C), Min:98.5 F (36.9 C), Max:98.5 F (36.9 C)  Recent Labs  Lab 11/21/21 0600 11/22/21 0920 11/23/21 0625 11/24/21 0550 11/26/21 1305  WBC 6.0 8.2  --   --  16.5*  CREATININE  --  2.38* 2.33* 2.64* 2.59*    Estimated Creatinine Clearance: 18.7 mL/min (A) (by C-G formula based on SCr of 2.59 mg/dL (H)).    No Known Allergies  Antimicrobials this admission: 2/12 Zosyn >>    Dose adjustments this admission:   Microbiology results: 2/12 BCx: pending  Thank you for allowing pharmacy to be a part of this patients care.  Darnelle Bos, PharmD 11/26/2021 4:42 PM

## 2021-11-26 NOTE — Assessment & Plan Note (Signed)
-   Patient is on metoprolol, diltiazem which is for atrial fibrillation -Patient is taking midodrine 5 mg 3 times daily for soft blood pressure

## 2021-11-26 NOTE — H&P (Signed)
I will History and Physical    Danielle [name] GYF:[id number] DOB: 1956-04-09 DOA: 11/26/2021  Referring MD/NP/PA:   PCP: [facility]   Patient coming from:  The patient is coming from home.  At baseline, pt is independent for most of ADL.        Chief Complaint: abdominal pain  HPI: Danielle [name] is a 66 y.o. female with medical history significant of hypertension, hyperlipidemia, COPD on 2 L oxygen, GERD, depression with anxiety, CHF, CKD-4, breast cancer (S/P of the left mastectomy, radiation and chemotherapy), atrial fibrillation on Eliquis, deficiency anemia, aortic stenosis, lymphedema, small bowel obstruction, right colectomy with ileostomy, recent reversal of ileostomy, who presents with abdominal pain.  Patient recently had prolonged hospital course from 1/9 - 2/10. She had ileostomy reversal.  After the surgery, patient developed postoperative ileus, anemia requiring blood transfusion and pleural effusions requiring thoracentesis.  She was just discharge to home 2 days ago. Patient states that she developed worsening abdominal pain today. Her abdominal pain is located in the lower abdomen, constant, sharp, 10 out of 10 in severity, nonradiating.  Denies fever or chills.  Not associated with nausea and vomiting.  Patient states she has had 3 episodes of diarrhea.  Denies chest pain, cough, shortness breath.  No symptoms of UTI.  Data Reviewed and ED Course: pt was found to have WBC 16.5, negative urinalysis, pending COVID PCR, renal function close to recent baseline, temperature normal, blood pressure 92/71, 131/74, heart rate 89, RR 22, oxygen saturation 96% on room air.  Patient is admitted to telemetry bed as an inpatient.  General surgery, Dr. Lysle Pearl is consulted.  EKG: Not done in ED, will get one.      Review of Systems:   General: no fevers, chills, no body weight gain, has poor appetite, has fatigue HEENT: no blurry vision, hearing changes or sore  throat Respiratory: no dyspnea, coughing, wheezing CV: no chest pain, no palpitations GI: no nausea, vomiting, has abdominal pain, diarrhea, no constipation GU: no dysuria, burning on urination, increased urinary frequency, hematuria  Ext: no leg edema Neuro: no unilateral weakness, numbness, or tingling, no vision change or hearing loss Skin: no rash, no skin tear. MSK: No muscle spasm, no deformity, no limitation of range of movement in spin Heme: No easy bruising.  Travel history: No recent long distant travel.   Allergy: No Known Allergies  Past Medical History:  Diagnosis Date   Anemia    Anxiety    Aortic atherosclerosis ([place])    Aortic valve stenosis 02/10/2018   a.) TTE 02/10/2018: EF 55-60%: mild AS with MPG of 12 mmHg. b.) TTE 04/21/2018: EF 35-40%; mild AS with MPG 8 mmHg. c.) TTE 11/07/2019: EF 50-55%; mild AS with MPG 10 mmHg.   Arthritis    Atrial fibrillation (HCC)    a.) CHA2DS2-VASc = 4 (age, sex, HTN, aortic plaque). b.) Rate/rhythm maintained on oral carvedilol; chronically anticoagulated on dose reduced apixaban. c.)  Attempted deployment of LAA occlusive device on 01/11/2021; parameters failed and procedure aborted.   Breast cancer, left ([place]) 2000   a.) T2N1M0; ER/PR (+) --> Tx'd with total mastectomy, LN resection, XRT, and chemotherapy   Cancer of right lung ([place]) 07/30/2016   a.) adenocarcinoma; ALK, ROS1, PDL1, BRAF, EGFR all negative.   Chronic diastolic CHF (congestive heart failure) ([place]) 11/26/2021   CKD (chronic kidney disease), stage IV (HCC)    COPD (chronic obstructive pulmonary disease) (HCC)    Dependence on supplemental oxygen  Depression    Diastolic dysfunction 03/70/4888   a.) TTE 02/10/2017: EF 55-60%; G2DD. b.) TTE 04/21/2018: EF 35-40%; mild LA dilation; mod MV regurgitation. c.) TTE 11/07/2019: EF 50-55%; G1DD.   DOE (dyspnea on exertion)    GERD (gastroesophageal reflux disease)    Heart murmur    History of 2019 novel coronavirus  disease (COVID-19) 10/14/2019   HLD (hyperlipidemia)    Hypertension    Long term current use of anticoagulant    a.) apixaban   Lymphedema    Personal history of chemotherapy    Personal history of radiation therapy    SBO (small bowel obstruction) ([place]) 11/05/2020   Vitamin D deficiency     Past Surgical History:  Procedure Laterality Date   Breast Biospy Left    [facility]   BREAST SURGERY     COLONOSCOPY N/A 04/30/2018   Procedure: COLONOSCOPY;  Surgeon: Virgel Manifold, MD;  Location: [facility] ENDOSCOPY;  Service: Endoscopy;  Laterality: N/A;   COLONOSCOPY N/A 07/22/2018   Procedure: COLONOSCOPY;  Surgeon: Virgel Manifold, MD;  Location: [facility] ENDOSCOPY;  Service: Endoscopy;  Laterality: N/A;   COLONOSCOPY WITH PROPOFOL N/A 09/21/2021   Procedure: COLONOSCOPY WITH PROPOFOL;  Surgeon: Benjamine Sprague, DO;  Location: [facility] ENDOSCOPY;  Service: General;  Laterality: N/A;   DILATION AND CURETTAGE OF UTERUS     ELECTROMAGNETIC NAVIGATION BROCHOSCOPY Right 04/11/2016   Procedure: ELECTROMAGNETIC NAVIGATION BRONCHOSCOPY;  Surgeon: Vilinda Boehringer, MD;  Location: [facility] ORS;  Service: Cardiopulmonary;  Laterality: Right;   ESOPHAGOGASTRODUODENOSCOPY N/A 07/22/2018   Procedure: ESOPHAGOGASTRODUODENOSCOPY (EGD);  Surgeon: Virgel Manifold, MD;  Location: [facility] ENDOSCOPY;  Service: Endoscopy;  Laterality: N/A;   ESOPHAGOGASTRODUODENOSCOPY (EGD) WITH PROPOFOL N/A 05/07/2018   Procedure: ESOPHAGOGASTRODUODENOSCOPY (EGD) WITH PROPOFOL;  Surgeon: Lucilla Lame, MD;  Location: [facility] ENDOSCOPY;  Service: Endoscopy;  Laterality: N/A;   ESOPHAGOGASTRODUODENOSCOPY (EGD) WITH PROPOFOL N/A 04/24/2019   Procedure: ESOPHAGOGASTRODUODENOSCOPY (EGD) WITH PROPOFOL;  Surgeon: Jonathon Bellows, MD;  Location: [facility] ENDOSCOPY;  Service: Gastroenterology;  Laterality: N/A;   ESOPHAGOGASTRODUODENOSCOPY (EGD) WITH PROPOFOL N/A 01/12/2020   Procedure: ESOPHAGOGASTRODUODENOSCOPY (EGD) WITH PROPOFOL;  Surgeon: Jonathon Bellows, MD;   Location: [facility] ENDOSCOPY;  Service: Gastroenterology;  Laterality: N/A;   ESOPHAGOGASTRODUODENOSCOPY (EGD) WITH PROPOFOL N/A 04/28/2020   Procedure: ESOPHAGOGASTRODUODENOSCOPY (EGD) WITH PROPOFOL;  Surgeon: Jonathon Bellows, MD;  Location: [facility] ENDOSCOPY;  Service: Gastroenterology;  Laterality: N/A;   EUS N/A 05/07/2019   Procedure: FULL UPPER ENDOSCOPIC ULTRASOUND (EUS) RADIAL;  Surgeon: Jola Schmidt, MD;  Location: [facility] ENDOSCOPY;  Service: Endoscopy;  Laterality: N/A;   ILEOSCOPY N/A 07/22/2018   Procedure: ILEOSCOPY THROUGH STOMA;  Surgeon: Virgel Manifold, MD;  Location: [facility] ENDOSCOPY;  Service: Endoscopy;  Laterality: N/A;   ILEOSTOMY     ILEOSTOMY N/A 09/08/2018   Procedure: ILEOSTOMY REVISION POSSIBLE CREATION;  Surgeon: Herbert Pun, MD;  Location: [facility] ORS;  Service: General;  Laterality: N/A;   ILEOSTOMY CLOSURE N/A 08/15/2018   Procedure: DILATION OF ILEOSTOMY STRICTURE;  Surgeon: Herbert Pun, MD;  Location: [facility] ORS;  Service: General;  Laterality: N/A;   LAPAROTOMY Right 05/04/2018   Procedure: EXPLORATORY LAPAROTOMY right colectomy right and left ostomy;  Surgeon: Herbert Pun, MD;  Location: [facility] ORS;  Service: General;  Laterality: Right;   LEFT ATRIAL APPENDAGE OCCLUSION N/A 01/11/2021   Procedure: LEFT ATRIAL APPENDAGE OCCLUSION ([place]); ABORTED PROCEDURE WITHOUT DEVICE BEING IMPLANTED; Location: [facility]; Surgeon: Mylinda Latina, MD   LUNG BIOPSY     MASTECTOMY Left    2000, [place]  Right    [facility]   XI ROBOTIC ASSISTED COLOSTOMY TAKEDOWN N/A 10/23/2021   Procedure: XI ROBOTIC ASSISTED ILEOSTOMY TAKEDOWN;  Surgeon: Herbert Pun, MD;  Location: [facility] ORS;  Service: General;  Laterality: N/A;  180 minutes for the surgery part please    Social History:  reports that she quit smoking about 9 years ago. Her smoking use included cigarettes. She has a 10.00 pack-year smoking history. Her smokeless tobacco use includes  snuff. She reports current alcohol use. She reports that she does not use drugs.  Family History:  Family History  Problem Relation Age of Onset   Breast cancer Mother 33   Cancer Mother        Breast    Cirrhosis Father    Breast cancer Paternal Aunt 58   Cancer Maternal Aunt        Breast      Prior to Admission medications   Medication Sig Start Date End Date Taking? Authorizing Provider  acetaminophen (TYLENOL) 325 MG tablet Take 2 tablets (650 mg total) by mouth every 6 (six) hours as needed for mild pain (or Fever >/= 101). Patient taking differently: Take 650 mg by mouth every 4 (four) hours as needed for moderate pain. 12/24/18   Gouru, Illene Silver, MD  albuterol (PROVENTIL HFA;VENTOLIN HFA) 108 (90 Base) MCG/ACT inhaler Inhale 2 puffs into the lungs every 6 (six) hours as needed for wheezing or shortness of breath. 04/24/18   Demetrios Loll, MD  amiodarone (PACERONE) 200 MG tablet Take 1 tablet (200 mg total) by mouth 2 (two) times daily. 11/24/21   Herbert Pun, MD  apixaban (ELIQUIS) 2.5 MG TABS tablet Take 2.5 mg by mouth 2 (two) times daily. 01/11/21   [provider]  calcium carbonate (TUMS - DOSED IN MG ELEMENTAL CALCIUM) 500 MG chewable tablet Chew 1 tablet by mouth daily.    [provider]  citalopram (CELEXA) 40 MG tablet Take 40 mg by mouth every morning.    [provider]  ferrous sulfate 325 (65 FE) MG tablet Take 1 tablet (325 mg total) by mouth 2 (two) times daily with a meal. 07/02/18   [name], MD  Fluticasone-Umeclidin-Vilant 100-62.5-25 MCG/INH AEPB Inhale 1 puff into the lungs every morning. 01/07/19   [provider]  loperamide (IMODIUM) 2 MG capsule Take 2 mg by mouth as needed for diarrhea or loose stools.    [provider]  metoprolol tartrate (LOPRESSOR) 25 MG tablet Take 0.5 tablets (12.5 mg total) by mouth 2 (two) times daily. 11/24/21   Herbert Pun, MD  midodrine (PROAMATINE) 5 MG tablet  Take 1 tablet (5 mg total) by mouth 3 (three) times daily with meals. 11/24/21   Herbert Pun, MD  OXYGEN Inhale 2 L into the lungs as needed (shortness of breath).    [provider]  pantoprazole (PROTONIX) 40 MG tablet Take 1 tablet (40 mg total) by mouth 2 (two) times daily before a meal. 08/05/20   Pokhrel, Laxman, MD  sucralfate (CARAFATE) 1 GM/10ML suspension Take 10 mLs (1 g total) by mouth 4 (four) times daily. Patient taking differently: Take 1 g by mouth 4 (four) times daily as needed (acid). 08/05/20   Pokhrel, Corrie Mckusick, MD  torsemide (DEMADEX) 20 MG tablet Take 1 tablet (20 mg total) by mouth daily. 11/25/21   Herbert Pun, MD  zolpidem (AMBIEN) 5 MG tablet Take 5 mg by mouth at bedtime as needed for sleep. 06/23/21   [provider]    Physical  Exam: Vitals:   11/26/21 1310 11/26/21 1435 11/26/21 1700  BP: 92/71 131/74 123/73  Pulse: 89 78 76  Resp: (!) $RemoveB'22 19 18  'fcSzUrhC$ Temp: 98.5 F (36.9 C)    TempSrc: Oral    SpO2: 96% 100% 100%   General: Not in acute distress HEENT:       Eyes: PERRL, EOMI, no scleral icterus.       ENT: No discharge from the ears and nose, no pharynx injection, no tonsillar enlargement.        Neck: No JVD, no bruit, no mass felt. Heme: No neck lymph node enlargement. Cardiac: S1/S2, RRR, No murmurs, No gallops or rubs. Respiratory: No rales, wheezing, rhonchi or rubs. GI: Soft, nondistended, has tenderness in lower abdomen, no rebound pain, no organomegaly, BS present.  Has multiple healed surgical scars GU: No hematuria Ext: No pitting leg edema bilaterally. 1+DP/PT pulse bilaterally. Musculoskeletal: No joint deformities, No joint redness or warmth, no limitation of ROM in spin. Skin: No rashes.  Neuro: Alert, oriented X3, cranial nerves II-XII grossly intact, moves all extremities normally.  Psych: Patient is not psychotic, no suicidal or hemocidal ideation.  Labs on Admission: I have personally reviewed following  labs and imaging studies  CBC: Recent Labs  Lab 11/21/21 0600 11/22/21 0920 11/26/21 1305  WBC 6.0 8.2 16.5*  HGB 8.1* 9.2* 11.4*  HCT 25.9* 28.9* 36.4  MCV 94.9 94.8 96.3  PLT 335 373 299   Basic Metabolic Panel: Recent Labs  Lab 11/22/21 0920 11/23/21 0625 11/24/21 0550 11/26/21 1305  NA 134* 134* 137 132*  K 3.6 3.9 3.7 3.5  CL 94* 90* 94* 92*  CO2 31 31 33* 25  GLUCOSE 123* 101* 82 137*  BUN 26* 31* 37* 41*  CREATININE 2.38* 2.33* 2.64* 2.59*  CALCIUM 9.2 9.1 9.1 9.4  MG  --  1.7 1.9  --   PHOS  --  4.6  --   --    GFR: Estimated Creatinine Clearance: 18.7 mL/min (A) (by C-G formula based on SCr of 2.59 mg/dL (H)). Liver Function Tests: Recent Labs  Lab 11/24/21 0550 11/26/21 1305  AST 17 26  ALT 10 13  ALKPHOS 60 67  BILITOT 0.4 0.9  PROT 7.3 8.9*  ALBUMIN 3.1* 3.9   Recent Labs  Lab 11/26/21 1305  LIPASE 23   No results for input(s): AMMONIA in the last 168 hours. Coagulation Profile: No results for input(s): INR, PROTIME in the last 168 hours. Cardiac Enzymes: No results for input(s): CKTOTAL, CKMB, CKMBINDEX, TROPONINI in the last 168 hours. BNP (last 3 results) No results for input(s): PROBNP in the last 8760 hours. HbA1C: No results for input(s): HGBA1C in the last 72 hours. CBG: No results for input(s): GLUCAP in the last 168 hours. Lipid Profile: No results for input(s): CHOL, HDL, LDLCALC, TRIG, CHOLHDL, LDLDIRECT in the last 72 hours. Thyroid Function Tests: No results for input(s): TSH, T4TOTAL, FREET4, T3FREE, THYROIDAB in the last 72 hours. Anemia Panel: No results for input(s): VITAMINB12, FOLATE, FERRITIN, TIBC, IRON, RETICCTPCT in the last 72 hours. Urine analysis:    Component Value Date/Time   COLORURINE YELLOW (A) 11/26/2021 1530   APPEARANCEUR CLEAR (A) 11/26/2021 1530   APPEARANCEUR Clear 12/06/2013 2245   LABSPEC 1.011 11/26/2021 1530   LABSPEC 1.015 12/06/2013 2245   PHURINE 5.0 11/26/2021 1530   GLUCOSEU NEGATIVE  11/26/2021 1530   GLUCOSEU Negative 12/06/2013 2245   HGBUR NEGATIVE 11/26/2021 1530   BILIRUBINUR NEGATIVE 11/26/2021 1530  BILIRUBINUR Negative 12/06/2013 2245   KETONESUR 5 (A) 11/26/2021 1530   PROTEINUR NEGATIVE 11/26/2021 1530   NITRITE NEGATIVE 11/26/2021 1530   LEUKOCYTESUR SMALL (A) 11/26/2021 1530   LEUKOCYTESUR Negative 12/06/2013 2245   Sepsis Labs: $RemoveBefo'[id number]$ (procalcitonin:4,lacticidven:4) ) Recent Results (from the past 240 hour(s))  Resp Panel by RT-PCR (Flu A&B, Covid) Nasopharyngeal Swab     Status: None   Collection Time: 11/26/21  4:01 PM   Specimen: Nasopharyngeal Swab; Nasopharyngeal(NP) swabs in vial transport medium  Result Value Ref Range Status   SARS Coronavirus 2 by RT PCR NEGATIVE NEGATIVE Final    Comment: (NOTE) SARS-CoV-2 target nucleic acids are NOT DETECTED.  The SARS-CoV-2 RNA is generally detectable in upper respiratory specimens during the acute phase of infection. The lowest concentration of SARS-CoV-2 viral copies this assay can detect is 138 copies/mL. A negative result does not preclude SARS-Cov-2 infection and should not be used as the sole basis for treatment or other patient management decisions. A negative result may occur with  improper specimen collection/handling, submission of specimen other than nasopharyngeal swab, presence of viral mutation(s) within the areas targeted by this assay, and inadequate number of viral copies(<138 copies/mL). A negative result must be combined with clinical observations, patient history, and epidemiological information. The expected result is Negative.  Fact Sheet for Patients:  EntrepreneurPulse.com.au  Fact Sheet for Healthcare Providers:  IncredibleEmployment.be  This test is no t yet approved or cleared by the [place] FDA and  has been authorized for detection and/or diagnosis of SARS-CoV-2 by FDA under an Emergency Use Authorization (EUA). This EUA  will remain  in effect (meaning this test can be used) for the duration of the COVID-19 declaration under Section 564(b)(1) of the Act, 21 U.S.C.section 360bbb-3(b)(1), unless the authorization is terminated  or revoked sooner.       Influenza A by PCR NEGATIVE NEGATIVE Final   Influenza B by PCR NEGATIVE NEGATIVE Final    Comment: (NOTE) The Xpert Xpress SARS-CoV-2/FLU/RSV plus assay is intended as an aid in the diagnosis of influenza from Nasopharyngeal swab specimens and should not be used as a sole basis for treatment. Nasal washings and aspirates are unacceptable for Xpert Xpress SARS-CoV-2/FLU/RSV testing.  Fact Sheet for Patients: EntrepreneurPulse.com.au  Fact Sheet for Healthcare Providers: IncredibleEmployment.be  This test is not yet approved or cleared by the [place] FDA and has been authorized for detection and/or diagnosis of SARS-CoV-2 by FDA under an Emergency Use Authorization (EUA). This EUA will remain in effect (meaning this test can be used) for the duration of the COVID-19 declaration under Section 564(b)(1) of the Act, 21 U.S.C. section 360bbb-3(b)(1), unless the authorization is terminated or revoked.  Performed at [facility], [address], [address], [place] [address]      Radiological Exams on Admission: CT ABDOMEN PELVIS WO CONTRAST  Result Date: 11/26/2021 CLINICAL DATA:  Abdominal pain. EXAM: CT ABDOMEN AND PELVIS WITHOUT CONTRAST TECHNIQUE: Multidetector CT imaging of the abdomen and pelvis was performed following the standard protocol without IV contrast. RADIATION DOSE REDUCTION: This exam was performed according to the departmental dose-optimization program which includes automated exposure control, adjustment of the mA and/or kV according to patient size and/or use of iterative reconstruction technique. COMPARISON:  11/05/2021. FINDINGS: Lower chest: Small right pleural effusion, decreased  compared to the prior CT. No acute findings at the lung bases. Hepatobiliary: Liver normal in size and overall attenuation. 9 mm low-attenuation lesion, segment 4A. Smaller similar appearing low-attenuation lesion, segment 6,  both consistent with cysts and both stable. No other liver masses or lesions. Small amount of dependent material in the gallbladder consistent with sludge or small stones. No wall thickening or inflammation. No bile duct dilation. Pancreas: Unremarkable. No pancreatic ductal dilatation or surrounding inflammatory changes. Spleen: Normal in size without focal abnormality. Adrenals/Urinary Tract: No adrenal masses. Kidneys normal size, orientation and position with no masses, stones or hydronephrosis. Normal ureters. Bladder mostly decompressed, otherwise unremarkable. Stomach/Bowel: Subtle hazy inflammatory type change adjacent to the lower descending colon centered on a posterior diverticulum. The apparent plan tori changes new from prior CT. There is also subtle haziness adjacent to the lower sigmoid colon where there are several diverticula. There is no extraluminal or free air. There are no fluid collections to suggest an abscess. Colon anastomosis staple line noted in the low central to right lower quadrant abdomen. Normal stomach. Small bowel normal in caliber. No small bowel wall thickening or inflammation. Vascular/Lymphatic: Extensive aortic and branch vessel atherosclerotic calcifications. No aneurysm. No enlarged lymph nodes. Reproductive: Uterus normal size. Several small calcifications. No adnexal masses. Other: Ill-defined increased attenuation in the subcutaneous soft tissues of the left mid abdomen and right lower quadrant, improved compared to the prior CT, consistent with previous ostomy sites or laparoscopic insertion sites. No abdominal wall hernia. No ascites. Musculoskeletal: Mild chronic depression of the upper endplate of L3 and upper endplate of L5. No acute fractures.  No bone lesions. IMPRESSION: 1. Subtle haziness is noted adjacent to the lower descending colon, with another area adjacent to the lower sigmoid colon, in both locations there are associated diverticula. Findings may reflect mild uncomplicated diverticulitis in either or both locations. No extraluminal or free air. No evidence of an abscess. 2. No other evidence of an acute abnormality within the abdomen or pelvis. No evidence of bowel obstruction. 3. Small right pleural effusion decreased in size from the prior CT. 4. Extensive aortic and branch vessel atherosclerosis, also stable. Electronically Signed   By: Amie Portland M.D.   On: 11/26/2021 15:09      Assessment/Plan Principal Problem:   Abdominal pain Active Problems:   Sepsis (HCC)   Acute diverticulitis   AF (paroxysmal atrial fibrillation) (HCC)   Chronic diastolic CHF (congestive heart failure) (HCC)   CKD (chronic kidney disease), stage IV (HCC)   Depression   HLD (hyperlipidemia)   HTN (hypertension)   Iron deficiency anemia   Moderate COPD (chronic obstructive pulmonary disease) (HCC)   Abdominal pain and sepsis due to acute diverticulitis:  Patient meets criteria with WBC 16.5 NT And obese RR 22.  Pending lactic acid level.  Currently hemodynamically stable.  CT scan showed possible diverticulitis.  Dr. Val Eagle of general surgery is consulted, no acute surgical issues now.  -Admitted to telemetry bed as inpatient -IV Zosyn -Follow-up of blood culture -will get Procalcitonin and trend lactic acid levels per sepsis protocol. -IVF: 1L of NS bolus in ED -As needed morphine for pain, Zofran for nausea -Check C. difficile  AF (paroxysmal atrial fibrillation) (HCC) - Continue metoprolol, diltiazem, amiodarone - Continue Eliquis (per Dr. Tonna Boehringer, it is okay to continue Eliquis)  Chronic diastolic CHF (congestive heart failure) (HCC): Recent 2D echo showed EF 60 to 65% with grade 1 diastolic dysfunction.  Patient does not have leg  edema or DVT.  CHF is compensated. -Continue home torsemide 20 mg daily  CKD (chronic kidney disease), stage IV (HCC): Renal function close to recent baseline.  Her creatinine is 2.64 on 11/24/2021.  Today her creatinine is at 2.59, BUN 41 -Follow-up with BM  Depression; -Continue home medications  HLD (hyperlipidemia): Not taking medications currently -Follow-up with PCP  HTN (hypertension) - Patient is on metoprolol, diltiazem which is for atrial fibrillation -Patient is taking midodrine 5 mg 3 times daily for soft blood pressure  Iron deficiency anemia: Hemoglobin stable, 11.4. -Patient is not taking iron supplement currently -Follow-up with CBC  Moderate COPD (chronic obstructive pulmonary disease) ([place]): stable -Bronchodilators        DVT ppx: on Eliquis  Code Status: Full code   Family Communication:  Yes, patient's  daughter  by phone  Disposition Plan:  Anticipate discharge back to previous environment  Consults called:  Dr. Lysle Pearl of surgery  Admission status and Level of care: Telemetry Medical:    as inpt      Severity of Illness:  The appropriate patient status for this patient is INPATIENT. Inpatient status is judged to be reasonable and necessary in order to provide the required intensity of service to ensure the patient's safety. The patient's presenting symptoms, physical exam findings, and initial radiographic and laboratory data in the context of their chronic comorbidities is felt to place them at high risk for further clinical deterioration. Furthermore, it is not anticipated that the patient will be medically stable for discharge from the hospital within 2 midnights of admission.   * I certify that at the point of admission it is my clinical judgment that the patient will require inpatient hospital care spanning beyond 2 midnights from the point of admission due to high intensity of service, high risk for further deterioration and high frequency of  surveillance required.*       Date of Service 11/26/2021    Ivor Costa [facility]   If 7PM-7AM, please contact night-coverage [email or web address] 11/26/2021, 5:45 PM

## 2021-11-26 NOTE — Assessment & Plan Note (Signed)
-   See assessment and plan under abdominal pain

## 2021-11-26 NOTE — Assessment & Plan Note (Signed)
Hemoglobin stable, 11.4. -Patient is not taking iron supplement currently -Follow-up with CBC

## 2021-11-26 NOTE — ED Provider Notes (Addendum)
[facility] Provider Note    Event Date/Time   First MD Initiated Contact with Patient 11/26/21 1330     (approximate)   History   Abdominal Pain   HPI  Danielle [name] is a 66 y.o. female   with past medical history of hypertension, coronary disease, atrial fibrillation, COPD, CKD and history of ileostomy status post ileostomy reversal recently presents with abdominal pain.  Patient had a prolonged hospital course recently from 1/9 to 2/10, she had her ileostomy reversed but developed postoperative ileus, CKD and anemia requiring blood transfusion.  Also had pleural effusions requiring thoracentesis.  She was discharged 2 days ago.  Yesterday she developed some intermittent abdominal pain that was worse today.  Pain is sharp in the lower abdomen intermittent.  She had some loose stool today.  Denies vomiting but has had nausea.  Still eating and drinking okay.  Denies fevers.  No urinary symptoms.     Past Medical History:  Diagnosis Date   Anemia    Anxiety    Aortic atherosclerosis (HCC)    Aortic valve stenosis 02/10/2018   a.) TTE 02/10/2018: EF 55-60%: mild AS with MPG of 12 mmHg. b.) TTE 04/21/2018: EF 35-40%; mild AS with MPG 8 mmHg. c.) TTE 11/07/2019: EF 50-55%; mild AS with MPG 10 mmHg.   Arthritis    Atrial fibrillation (HCC)    a.) CHA2DS2-VASc = 4 (age, sex, HTN, aortic plaque). b.) Rate/rhythm maintained on oral carvedilol; chronically anticoagulated on dose reduced apixaban. c.)  Attempted deployment of LAA occlusive device on 01/11/2021; parameters failed and procedure aborted.   Breast cancer, left (HCC) 2000   a.) T2N1M0; ER/PR (+) --> Tx'd with total mastectomy, LN resection, XRT, and chemotherapy   Cancer of right lung (HCC) 07/30/2016   a.) adenocarcinoma; ALK, ROS1, PDL1, BRAF, EGFR all negative.   CKD (chronic kidney disease), stage IV (HCC)    COPD (chronic obstructive pulmonary disease) (HCC)    Dependence on supplemental oxygen     Depression    Diastolic dysfunction 02/10/2017   a.) TTE 02/10/2017: EF 55-60%; G2DD. b.) TTE 04/21/2018: EF 35-40%; mild LA dilation; mod MV regurgitation. c.) TTE 11/07/2019: EF 50-55%; G1DD.   DOE (dyspnea on exertion)    GERD (gastroesophageal reflux disease)    Heart murmur    History of 2019 novel coronavirus disease (COVID-19) 10/14/2019   HLD (hyperlipidemia)    Hypertension    Long term current use of anticoagulant    a.) apixaban   Lymphedema    Personal history of chemotherapy    Personal history of radiation therapy    SBO (small bowel obstruction) (HCC) 11/05/2020   Vitamin D deficiency     Patient Active Problem List   Diagnosis Date Noted   Aspiration pneumonia of right lower lobe due to gastric secretions (HCC)    Ileus following gastrointestinal surgery (HCC)    Hypomagnesemia    Anemia of chronic disease    Hyperphosphatemia 10/24/2021   Hypotension 11/17/2020   Ileostomy status (HCC) 11/17/2020   Acute shoulder pain due to trauma, left 11/17/2020   Syncope 11/17/2020   COPD with acute exacerbation (HCC) 08/01/2020   Atrial fibrillation with rapid ventricular response (HCC) 08/01/2020   Cellulitis of left arm 01/20/2020   CKD (chronic kidney disease), stage IV (HCC) 01/20/2020   Hypokalemia 01/20/2020   Elevated troponin 01/20/2020   Sepsis (HCC) 01/20/2020   Acute on chronic combined systolic and diastolic CHF (congestive heart failure) (HCC) 01/20/2020  Pleural effusion due to CHF (congestive heart failure) ([place]) 11/06/2019   Diarrhea    Acute renal failure superimposed on stage 3b chronic kidney disease ([place]) 10/29/2019   GERD (gastroesophageal reflux disease) 10/29/2019   Depression 10/29/2019   Hyperkalemia 10/14/2019   History of 2019 novel coronavirus disease (COVID-19) 10/14/2019   Acute respiratory failure due to COVID-19 [facility]) 09/27/2019   Respiratory tract infection due to COVID-19 virus 09/27/2019   Acute blood loss anemia 06/20/2019    Normocytic anemia 06/20/2019   GI bleeding 06/19/2019   Anemia of chronic kidney failure, stage 4 (severe) ([place]) 06/12/2019   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   Acute on chronic respiratory failure with hypoxia and hypercapnia ([place]) 12/22/2018   Ileostomy dysfunction ([place]) 09/28/2018   Reflux esophagitis    Iron deficiency anemia secondary to blood loss (chronic)    SBO (small bowel obstruction) ([place]) 95/28/4132   Chronic systolic heart failure ([place]) 06/09/2018   HTN (hypertension) 06/09/2018   AF (paroxysmal atrial fibrillation) ([place]) 06/09/2018   Lymphedema 06/09/2018   Acute kidney injury superimposed on CKD ([place]) 06/04/2018   Severe protein-calorie malnutrition ([place]) 06/04/2018   Blood in stool    Focal (segmental) acute (reversible) ischemia of large intestine (HCC)    Ulceration of intestine    Diverticulosis of large intestine without diverticulitis    Abnormal CT scan, colon    Colitis    COPD exacerbation ([place])    Malnutrition of moderate degree 04/23/2018   Palliative care by specialist    DNR (do not resuscitate) discussion    Weakness    Acute respiratory failure with hypoxia ([place]) 04/21/2018   Hyponatremia 02/10/2017   Syncope and collapse 02/09/2017   Lung nodule, solitary 07/25/2016   Malignant neoplasm of upper lobe of right lung ([place]) 07/25/2016   Carcinoma of overlapping sites of left breast in female, estrogen receptor positive ([place]) 06/22/2016   Multiple lung nodules 06/22/2016   Lesion of right lung    Breast cancer in female ([place]) 02/21/2016   Moderate COPD (chronic obstructive pulmonary disease) ([place]) 08/24/2014     Physical Exam  Triage Vital Signs: ED Triage Vitals  Enc Vitals Group     BP 11/26/21 1310 92/71     Pulse Rate 11/26/21 1310 89     Resp 11/26/21 1310 (!) 22     Temp 11/26/21 1310 98.5 F (36.9 C)     Temp Source 11/26/21 1310 Oral     SpO2 11/26/21 1310 96 %     Weight --      Height --      Head  Circumference --      Peak Flow --      Pain Score 11/26/21 1311 6     Pain Loc --      Pain Edu? --      Excl. in [place]? --     Most recent vital signs: Vitals:   11/26/21 1310 11/26/21 1435  BP: 92/71 131/74  Pulse: 89 78  Resp: (!) 22 19  Temp: 98.5 F (36.9 C)   SpO2: 96% 100%     General: Awake, no distress.  Chronically ill-appearing CV:  Good peripheral perfusion.  Resp:  Normal effort.  On 2 L nasal cannula Abd:  No distention.  Tender primarily in the lower abdomen just left of midline, no guarding Neuro:             Awake, Alert, Oriented x 3  Other:  ED Results / Procedures / Treatments  Labs (all labs ordered are listed, but only abnormal results are displayed) Labs Reviewed  COMPREHENSIVE METABOLIC PANEL - Abnormal; Notable for the following components:      Result Value   Sodium 132 (*)    Chloride 92 (*)    Glucose, Bld 137 (*)    BUN 41 (*)    Creatinine, Ser 2.59 (*)    Total Protein 8.9 (*)    GFR, Estimated 20 (*)    All other components within normal limits  CBC - Abnormal; Notable for the following components:   WBC 16.5 (*)    RBC 3.78 (*)    Hemoglobin 11.4 (*)    All other components within normal limits  LIPASE, BLOOD  URINALYSIS, ROUTINE W REFLEX MICROSCOPIC     EKG     RADIOLOGY I reviewed the CT abdomen pelvis which is read by radiology as possible uncomplicated diverticulitis with 2 areas of haziness in the sigmoid colon   PROCEDURES:  Critical Care performed: No  Procedures  The patient is on the cardiac monitor to evaluate for evidence of arrhythmia and/or significant heart rate changes.   MEDICATIONS ORDERED IN ED: Medications  sodium chloride 0.9 % bolus 1,000 mL (1,000 mLs Intravenous Bolus 11/26/21 1432)  HYDROmorphone (DILAUDID) injection 0.5 mg (0.5 mg Intravenous Given 11/26/21 1432)     IMPRESSION / MDM / ASSESSMENT AND PLAN / ED COURSE  I reviewed the triage vital signs and the nursing notes.                               Differential diagnosis includes, but is not limited to, SBO, postoperative complication, UTI, gastroenteritis, colitis  This patient is a 66 year old female who is 2 days out from recent discharge after prolonged hospital course during which she had her ileostomy reversed and then a postoperative course complicated by ileus CKD and hypoxemic respiratory failure.  She developed abdominal pain yesterday that is worsened today.  Primarily in the lower abdomen with associated diarrhea.  Vital signs are notable for borderline hypotension, blood pressure 92/71, of note patient was started on midodrine during last hospitalizations for low pressures.  Abdomen is tender primarily in the lower suprapubic region in the left lower quadrant.  She did complain of some very dark stool, rectal exam performed with guaiac negative stool.  Patient's labs are notable for leukocytosis of 16, was 8 4 days ago.  Hemoglobin 11.4 from 9.2.  Fattening similar to what it was 2 days ago, mild hyponatremia to 132 days normal, LFTs normal.  Given recent surgery with worsening abdominal pain and concern for SBO or potential surgical complication.  Given her GFR we will obtain a CT of the abdomen without contrast.  We will give a liter of fluids and treat pain with Dilaudid.  CT shows 2 areas of haziness in the sigmoid colon, read as radiology as possible uncomplicated diverticulitis.  Discussed with Dr. Lysle Pearl, surgery on-call given patient's recent postoperative complicated course.  Dr. Lysle Pearl spoke with Dr. Peyton Najjar who knows the patient very well and he would like to have the patient admitted.  We will give her Zosyn for coverage of possible diverticulitis.  They requested hospitalist admission given her complicated medical problems.  Clinical Course as of 11/26/21 1527  Sun Nov 26, 2021  1501 Creatinine(!): 2.59 [KM]  1501 Calcium: 9.4 [KM]    Clinical Course User Index [KM] Starleen Blue,  Jennette Dubin, MD     FINAL  CLINICAL IMPRESSION(S) / ED DIAGNOSES   Final diagnoses:  Lower abdominal pain     Rx / DC Orders   ED Discharge Orders     None        Note:  This document was prepared using Dragon voice recognition software and may include unintentional dictation errors.   Rada Hay, MD 11/26/21 1527    Rada Hay, MD 11/26/21 [phone]

## 2021-11-26 NOTE — Assessment & Plan Note (Signed)
-   Continue metoprolol, diltiazem, amiodarone -Continue Eliquis (per Dr. Lysle Pearl, it is okay to continue Eliquis)

## 2021-11-26 NOTE — Assessment & Plan Note (Signed)
-   Continue home medications 

## 2021-11-26 NOTE — ED Notes (Signed)
Lab called to obtain admission labs

## 2021-11-26 NOTE — ED Triage Notes (Signed)
Pt via POV from home. Pt c/o lower abd pain, nausea, and diarrhea that started yesterday evening that got worse today. Pt is A&OX4 and NAD. See first nurse note. Pt wears 2L [place] chronically.

## 2021-11-26 NOTE — Progress Notes (Signed)
Notified provider patient's C.diff resulted positive.

## 2021-11-26 NOTE — Assessment & Plan Note (Signed)
Renal function close to recent baseline.  Her creatinine is 2.64 on 11/24/2021.  Today her creatinine is at 2.59, BUN 41 -Follow-up with BMP

## 2021-11-26 NOTE — Progress Notes (Incomplete)
° °      CROSS COVER NOTE  NAME: CHENNEL OLIVOS MRN: [id number] DOB : 02-25-1956   C.diff  125mg  PO vancomycin QID q10days

## 2021-11-26 NOTE — Assessment & Plan Note (Signed)
See assessment and plan under abdominal pain

## 2021-11-26 NOTE — Assessment & Plan Note (Signed)
Not taking medications currently -Follow-up with PCP

## 2021-11-26 NOTE — ED Notes (Signed)
Pt refusing EKG at this time and states "Im too cold right now, Ill let you know when I warm up". Pt denies chest pain.

## 2021-11-26 NOTE — ED Notes (Signed)
Pt at CT

## 2021-11-26 NOTE — Consult Note (Signed)
Subjective:   CC: abbdominal pain, diarrhea  HPI:  Danielle [name] is a 66 y.o. female who was consulted by [facility] for issue above.  Symptoms were first noted 1 day ago. Pain is sharp, confined to the lower abdomen, without radiation.  Associated with decreased stooling, then some diarrhea, exacerbated by nothing specific     Past Medical History:  has a past medical history of Anemia, Anxiety, Aortic atherosclerosis (HCC), Aortic valve stenosis (02/10/2018), Arthritis, Atrial fibrillation ([place]), Breast cancer, left ([place]) (2000), Cancer of right lung ([place]) (07/30/2016), Chronic diastolic CHF (congestive heart failure) ([place]) (11/26/2021), CKD (chronic kidney disease), stage IV (HCC), COPD (chronic obstructive pulmonary disease) ([place]), Dependence on supplemental oxygen, Depression, Diastolic dysfunction (85/88/5027), DOE (dyspnea on exertion), GERD (gastroesophageal reflux disease), Heart murmur, History of 2019 novel coronavirus disease (COVID-19) (10/14/2019), HLD (hyperlipidemia), Hypertension, Long term current use of anticoagulant, Lymphedema, Personal history of chemotherapy, Personal history of radiation therapy, SBO (small bowel obstruction) ([place]) (11/05/2020), and Vitamin D deficiency.  Past Surgical History:  Past Surgical History:  Procedure Laterality Date   Breast Biospy Left    [facility]   BREAST SURGERY     COLONOSCOPY N/A 04/30/2018   Procedure: COLONOSCOPY;  Surgeon: Virgel Manifold, MD;  Location: [facility] ENDOSCOPY;  Service: Endoscopy;  Laterality: N/A;   COLONOSCOPY N/A 07/22/2018   Procedure: COLONOSCOPY;  Surgeon: Virgel Manifold, MD;  Location: [facility] ENDOSCOPY;  Service: Endoscopy;  Laterality: N/A;   COLONOSCOPY WITH PROPOFOL N/A 09/21/2021   Procedure: COLONOSCOPY WITH PROPOFOL;  Surgeon: Benjamine Sprague, DO;  Location: [facility] ENDOSCOPY;  Service: General;  Laterality: N/A;   DILATION AND CURETTAGE OF UTERUS     ELECTROMAGNETIC NAVIGATION BROCHOSCOPY Right 04/11/2016    Procedure: ELECTROMAGNETIC NAVIGATION BRONCHOSCOPY;  Surgeon: Vilinda Boehringer, MD;  Location: [facility] ORS;  Service: Cardiopulmonary;  Laterality: Right;   ESOPHAGOGASTRODUODENOSCOPY N/A 07/22/2018   Procedure: ESOPHAGOGASTRODUODENOSCOPY (EGD);  Surgeon: Virgel Manifold, MD;  Location: [facility] ENDOSCOPY;  Service: Endoscopy;  Laterality: N/A;   ESOPHAGOGASTRODUODENOSCOPY (EGD) WITH PROPOFOL N/A 05/07/2018   Procedure: ESOPHAGOGASTRODUODENOSCOPY (EGD) WITH PROPOFOL;  Surgeon: Lucilla Lame, MD;  Location: [facility] ENDOSCOPY;  Service: Endoscopy;  Laterality: N/A;   ESOPHAGOGASTRODUODENOSCOPY (EGD) WITH PROPOFOL N/A 04/24/2019   Procedure: ESOPHAGOGASTRODUODENOSCOPY (EGD) WITH PROPOFOL;  Surgeon: Jonathon Bellows, MD;  Location: [facility] ENDOSCOPY;  Service: Gastroenterology;  Laterality: N/A;   ESOPHAGOGASTRODUODENOSCOPY (EGD) WITH PROPOFOL N/A 01/12/2020   Procedure: ESOPHAGOGASTRODUODENOSCOPY (EGD) WITH PROPOFOL;  Surgeon: Jonathon Bellows, MD;  Location: [facility] ENDOSCOPY;  Service: Gastroenterology;  Laterality: N/A;   ESOPHAGOGASTRODUODENOSCOPY (EGD) WITH PROPOFOL N/A 04/28/2020   Procedure: ESOPHAGOGASTRODUODENOSCOPY (EGD) WITH PROPOFOL;  Surgeon: Jonathon Bellows, MD;  Location: [facility] ENDOSCOPY;  Service: Gastroenterology;  Laterality: N/A;   EUS N/A 05/07/2019   Procedure: FULL UPPER ENDOSCOPIC ULTRASOUND (EUS) RADIAL;  Surgeon: Jola Schmidt, MD;  Location: [facility] ENDOSCOPY;  Service: Endoscopy;  Laterality: N/A;   ILEOSCOPY N/A 07/22/2018   Procedure: ILEOSCOPY THROUGH STOMA;  Surgeon: Virgel Manifold, MD;  Location: [facility] ENDOSCOPY;  Service: Endoscopy;  Laterality: N/A;   ILEOSTOMY     ILEOSTOMY N/A 09/08/2018   Procedure: ILEOSTOMY REVISION POSSIBLE CREATION;  Surgeon: Herbert Pun, MD;  Location: [facility] ORS;  Service: General;  Laterality: N/A;   ILEOSTOMY CLOSURE N/A 08/15/2018   Procedure: DILATION OF ILEOSTOMY STRICTURE;  Surgeon: Herbert Pun, MD;  Location: [facility] ORS;  Service: General;   Laterality: N/A;   LAPAROTOMY Right 05/04/2018   Procedure: EXPLORATORY LAPAROTOMY right colectomy right and left ostomy;  Surgeon: Herbert Pun, MD;  Location: [facility]  ORS;  Service: General;  Laterality: Right;   LEFT ATRIAL APPENDAGE OCCLUSION N/A 01/11/2021   Procedure: LEFT ATRIAL APPENDAGE OCCLUSION ([place]); ABORTED PROCEDURE WITHOUT DEVICE BEING IMPLANTED; Location: [facility]; Surgeon: Mylinda Latina, MD   LUNG BIOPSY     MASTECTOMY Left    2000, [place] Right    [place]   XI ROBOTIC ASSISTED COLOSTOMY TAKEDOWN N/A 10/23/2021   Procedure: XI ROBOTIC ASSISTED ILEOSTOMY TAKEDOWN;  Surgeon: Herbert Pun, MD;  Location: [facility] ORS;  Service: General;  Laterality: N/A;  180 minutes for the surgery part please    Family History: family history includes Breast cancer (age of onset: 43) in her paternal aunt; Breast cancer (age of onset: 29) in her mother; Cancer in her maternal aunt and mother; Cirrhosis in her father.  Social History:  reports that she quit smoking about 9 years ago. Her smoking use included cigarettes. She has a 10.00 pack-year smoking history. Her smokeless tobacco use includes snuff. She reports current alcohol use. She reports that she does not use drugs.  Current Medications:  Prior to Admission medications   Medication Sig Start Date End Date Taking? Authorizing Provider  acetaminophen (TYLENOL) 325 MG tablet Take 2 tablets (650 mg total) by mouth every 6 (six) hours as needed for mild pain (or Fever >/= 101). Patient taking differently: Take 650 mg by mouth every 4 (four) hours as needed for moderate pain. 12/24/18   Gouru, Illene Silver, MD  albuterol (PROVENTIL HFA;VENTOLIN HFA) 108 (90 Base) MCG/ACT inhaler Inhale 2 puffs into the lungs every 6 (six) hours as needed for wheezing or shortness of breath. 04/24/18   Demetrios Loll, MD  amiodarone (PACERONE) 200 MG tablet Take 1 tablet (200 mg total) by mouth 2 (two) times daily. 11/24/21    Herbert Pun, MD  apixaban (ELIQUIS) 2.5 MG TABS tablet Take 2.5 mg by mouth 2 (two) times daily. 01/11/21   [provider]  calcium carbonate (TUMS - DOSED IN MG ELEMENTAL CALCIUM) 500 MG chewable tablet Chew 1 tablet by mouth daily.    [provider]  citalopram (CELEXA) 40 MG tablet Take 40 mg by mouth every morning.    [provider]  ferrous sulfate 325 (65 FE) MG tablet Take 1 tablet (325 mg total) by mouth 2 (two) times daily with a meal. 07/02/18   [name], MD  Fluticasone-Umeclidin-Vilant 100-62.5-25 MCG/INH AEPB Inhale 1 puff into the lungs every morning. 01/07/19   [provider]  loperamide (IMODIUM) 2 MG capsule Take 2 mg by mouth as needed for diarrhea or loose stools.    [provider]  metoprolol tartrate (LOPRESSOR) 25 MG tablet Take 0.5 tablets (12.5 mg total) by mouth 2 (two) times daily. 11/24/21   Herbert Pun, MD  midodrine (PROAMATINE) 5 MG tablet Take 1 tablet (5 mg total) by mouth 3 (three) times daily with meals. 11/24/21   Herbert Pun, MD  OXYGEN Inhale 2 L into the lungs as needed (shortness of breath).    [provider]  pantoprazole (PROTONIX) 40 MG tablet Take 1 tablet (40 mg total) by mouth 2 (two) times daily before a meal. 08/05/20   Pokhrel, Laxman, MD  sucralfate (CARAFATE) 1 GM/10ML suspension Take 10 mLs (1 g total) by mouth 4 (four) times daily. Patient taking differently: Take 1 g by mouth 4 (four) times daily as needed (acid). 08/05/20   Pokhrel, Corrie Mckusick, MD  torsemide (DEMADEX) 20 MG tablet Take 1 tablet (20 mg total) by mouth  daily. 11/25/21   Herbert Pun, MD  zolpidem (AMBIEN) 5 MG tablet Take 5 mg by mouth at bedtime as needed for sleep. 06/23/21   [provider]    Allergies:  Allergies as of 11/26/2021   (No Known Allergies)    ROS:  General: Denies weight loss, weight gain, fatigue, fevers, chills, and night sweats. Eyes: Denies  blurry vision, double vision, eye pain, itchy eyes, and tearing. Ears: Denies hearing loss, earache, and ringing in ears. Nose: Denies sinus pain, congestion, infections, runny nose, and nosebleeds. Mouth/throat: Denies hoarseness, sore throat, bleeding gums, and difficulty swallowing. Heart: Denies chest pain, palpitations, racing heart, irregular heartbeat, leg pain or swelling, and decreased activity tolerance. Respiratory: Denies breathing difficulty, shortness of breath, wheezing, cough, and sputum. GI: Denies change in appetite, heartburn, nausea, vomiting, constipation, diarrhea, and blood in stool. GU: Denies difficulty urinating, pain with urinating, urgency, frequency, blood in urine. Musculoskeletal: Denies joint stiffness, pain, swelling, muscle weakness. Skin: Denies rash, itching, mass, tumors, sores, and boils Neurologic: Denies headache, fainting, dizziness, seizures, numbness, and tingling. Psychiatric: Denies depression, anxiety, difficulty sleeping, and memory loss. Endocrine: Denies heat or cold intolerance, and increased thirst or urination. Blood/lymph: Denies easy bruising, easy bruising, and swollen glands     Objective:     BP 131/74    Pulse 78    Temp 98.5 F (36.9 C) (Oral)    Resp 19    SpO2 100%   Constitutional :  alert, cooperative, appears stated age, and no distress  Lymphatics/Throat:  no asymmetry, masses, or scars  Respiratory:  clear to auscultation bilaterally  Cardiovascular:  regular rate and rhythm  Gastrointestinal: Soft, no guarding, some TTP in lower abdomen.  Incisions healed.  No sign of infection .   Musculoskeletal: Steady movement  Skin: Cool and moist, visible surgical scars   Psychiatric: Normal affect, non-agitated, not confused       LABS:  CMP Latest Ref Rng & Units 11/26/2021 11/24/2021 11/23/2021  Glucose 70 - 99 mg/dL 137(H) 82 101(H)  BUN 8 - 23 mg/dL 41(H) 37(H) 31(H)  Creatinine 0.44 - 1.00 mg/dL 2.59(H) 2.64(H) 2.33(H)   Sodium 135 - 145 mmol/L 132(L) 137 134(L)  Potassium 3.5 - 5.1 mmol/L 3.5 3.7 3.9  Chloride 98 - 111 mmol/L 92(L) 94(L) 90(L)  CO2 22 - 32 mmol/L 25 33(H) 31  Calcium 8.9 - 10.3 mg/dL 9.4 9.1 9.1  Total Protein 6.5 - 8.1 g/dL 8.9(H) 7.3 -  Total Bilirubin 0.3 - 1.2 mg/dL 0.9 0.4 -  Alkaline Phos 38 - 126 U/L 67 60 -  AST 15 - 41 U/L 26 17 -  ALT 0 - 44 U/L 13 10 -   CBC Latest Ref Rng & Units 11/26/2021 11/22/2021 11/21/2021  WBC 4.0 - 10.5 K/uL 16.5(H) 8.2 6.0  Hemoglobin 12.0 - 15.0 g/dL 11.4(L) 9.2(L) 8.1(L)  Hematocrit 36.0 - 46.0 % 36.4 28.9(L) 25.9(L)  Platelets 150 - 400 K/uL 361 373 335    RADS: CLINICAL DATA:  Abdominal pain.   EXAM: CT ABDOMEN AND PELVIS WITHOUT CONTRAST   TECHNIQUE: Multidetector CT imaging of the abdomen and pelvis was performed following the standard protocol without IV contrast.   RADIATION DOSE REDUCTION: This exam was performed according to the departmental dose-optimization program which includes automated exposure control, adjustment of the mA and/or kV according to patient size and/or use of iterative reconstruction technique.   COMPARISON:  11/05/2021.   FINDINGS: Lower chest: Small right pleural effusion, decreased compared to the prior  CT. No acute findings at the lung bases.   Hepatobiliary: Liver normal in size and overall attenuation. 9 mm low-attenuation lesion, segment 4A. Smaller similar appearing low-attenuation lesion, segment 6, both consistent with cysts and both stable. No other liver masses or lesions. Small amount of dependent material in the gallbladder consistent with sludge or small stones. No wall thickening or inflammation. No bile duct dilation.   Pancreas: Unremarkable. No pancreatic ductal dilatation or surrounding inflammatory changes.   Spleen: Normal in size without focal abnormality.   Adrenals/Urinary Tract: No adrenal masses. Kidneys normal size, orientation and position with no masses, stones or  hydronephrosis. Normal ureters. Bladder mostly decompressed, otherwise unremarkable.   Stomach/Bowel: Subtle hazy inflammatory type change adjacent to the lower descending colon centered on a posterior diverticulum. The apparent plan tori changes new from prior CT. There is also subtle haziness adjacent to the lower sigmoid colon where there are several diverticula. There is no extraluminal or free air. There are no fluid collections to suggest an abscess. Colon anastomosis staple line noted in the low central to right lower quadrant abdomen.   Normal stomach. Small bowel normal in caliber. No small bowel wall thickening or inflammation.   Vascular/Lymphatic: Extensive aortic and branch vessel atherosclerotic calcifications. No aneurysm. No enlarged lymph nodes.   Reproductive: Uterus normal size. Several small calcifications. No adnexal masses.   Other: Ill-defined increased attenuation in the subcutaneous soft tissues of the left mid abdomen and right lower quadrant, improved compared to the prior CT, consistent with previous ostomy sites or laparoscopic insertion sites. No abdominal wall hernia. No ascites.   Musculoskeletal: Mild chronic depression of the upper endplate of L3 and upper endplate of L5. No acute fractures. No bone lesions.   IMPRESSION: 1. Subtle haziness is noted adjacent to the lower descending colon, with another area adjacent to the lower sigmoid colon, in both locations there are associated diverticula. Findings may reflect mild uncomplicated diverticulitis in either or both locations. No extraluminal or free air. No evidence of an abscess. 2. No other evidence of an acute abnormality within the abdomen or pelvis. No evidence of bowel obstruction. 3. Small right pleural effusion decreased in size from the prior CT. 4. Extensive aortic and branch vessel atherosclerosis, also stable.     Electronically Signed   By: Lajean Manes M.D.   On: 11/26/2021  15:09 Assessment:   Abdominal pain/diarrhea.  Hx of recent ileostomy reversal  Plan:  Pt has leukocytosis and obvious TTP on abd exam, but overall looking stable and CT notes changes above. Doubt anything attributable to recent surgery, but recommend admission for pain control, monitor for worsening, emperic abx.  Further care of chronic medical issues per primary

## 2021-11-26 NOTE — Assessment & Plan Note (Signed)
Recent 2D echo showed EF 60 to 65% with grade 1 diastolic dysfunction.  Patient does not have leg edema or DVT.  CHF is compensated. -Continue home torsemide 20 mg daily

## 2021-11-26 NOTE — ED Triage Notes (Signed)
First nurse note: pt comes ems from home with pain around ileostomy site. Was reversed a month ago. Pain started yesterday. Ambulatory on scene. Aox4. VSS. Pt is on chronic oxygen 2L at home.

## 2021-11-26 NOTE — ED Notes (Signed)
PIV attempted x2 by this RN with no success, MD aware.

## 2021-11-26 NOTE — ED Notes (Signed)
Attempted IV start at this time. Unsuccessful attempt. This RN was able to obtain blood work.

## 2021-11-27 ENCOUNTER — Inpatient Hospital Stay: Payer: Medicare Other | Admitting: Internal Medicine

## 2021-11-27 ENCOUNTER — Inpatient Hospital Stay: Payer: Medicare Other

## 2021-11-27 ENCOUNTER — Encounter: Payer: Self-pay | Admitting: Internal Medicine

## 2021-11-27 ENCOUNTER — Other Ambulatory Visit (HOSPITAL_COMMUNITY): Payer: Self-pay

## 2021-11-27 DIAGNOSIS — I10 Essential (primary) hypertension: Secondary | ICD-10-CM

## 2021-11-27 DIAGNOSIS — A0472 Enterocolitis due to Clostridium difficile, not specified as recurrent: Secondary | ICD-10-CM | POA: Diagnosis present

## 2021-11-27 LAB — CBC
HCT: 29.3 % — ABNORMAL LOW (ref 36.0–46.0)
Hemoglobin: 9.3 g/dL — ABNORMAL LOW (ref 12.0–15.0)
MCH: 29.6 pg (ref 26.0–34.0)
MCHC: 31.7 g/dL (ref 30.0–36.0)
MCV: 93.3 fL (ref 80.0–100.0)
Platelets: 316 10*3/uL (ref 150–400)
RBC: 3.14 MIL/uL — ABNORMAL LOW (ref 3.87–5.11)
RDW: 15.3 % (ref 11.5–15.5)
WBC: 14 10*3/uL — ABNORMAL HIGH (ref 4.0–10.5)
nRBC: 0 % (ref 0.0–0.2)

## 2021-11-27 LAB — BASIC METABOLIC PANEL
Anion gap: 13 (ref 5–15)
BUN: 35 mg/dL — ABNORMAL HIGH (ref 8–23)
CO2: 27 mmol/L (ref 22–32)
Calcium: 9 mg/dL (ref 8.9–10.3)
Chloride: 98 mmol/L (ref 98–111)
Creatinine, Ser: 2.32 mg/dL — ABNORMAL HIGH (ref 0.44–1.00)
GFR, Estimated: 22 mL/min — ABNORMAL LOW (ref >60)
Glucose, Bld: 101 mg/dL — ABNORMAL HIGH (ref 70–99)
Potassium: 3.4 mmol/L — ABNORMAL LOW (ref 3.5–5.1)
Sodium: 138 mmol/L (ref 135–145)

## 2021-11-27 LAB — HIV ANTIBODY (ROUTINE TESTING W REFLEX): HIV Screen 4th Generation wRfx: NONREACTIVE

## 2021-11-27 LAB — GLUCOSE, CAPILLARY: Glucose-Capillary: 73 mg/dL (ref 70–99)

## 2021-11-27 MED ORDER — SODIUM CHLORIDE 0.9 % IV SOLN
INTRAVENOUS | Status: DC
Start: 2021-11-27 — End: 2021-11-27

## 2021-11-27 MED ORDER — ADULT MULTIVITAMIN W/MINERALS CH
1.0000 | ORAL_TABLET | Freq: Every day | ORAL | Status: DC
  Administered 2021-11-27 – 2021-11-29 (×3): 1 via ORAL
  Filled 2021-11-27 (×3): qty 1

## 2021-11-27 MED ORDER — DILTIAZEM HCL ER COATED BEADS 240 MG PO CP24: 240.0000 mg | ORAL_CAPSULE | Freq: Every day | ORAL | Status: DC

## 2021-11-27 MED ORDER — POTASSIUM CHLORIDE CRYS ER 20 MEQ PO TBCR
40.0000 meq | EXTENDED_RELEASE_TABLET | Freq: Once | ORAL | Status: AC
  Administered 2021-11-27: 40 meq via ORAL
  Filled 2021-11-27: qty 2

## 2021-11-27 MED ORDER — MIDODRINE HCL 5 MG PO TABS
5.0000 mg | ORAL_TABLET | Freq: Once | ORAL | Status: AC
  Administered 2021-11-27: 5 mg via ORAL
  Filled 2021-11-27: qty 1

## 2021-11-27 MED ORDER — FIDAXOMICIN 200 MG PO TABS
200.0000 mg | ORAL_TABLET | Freq: Two times a day (BID) | ORAL | Status: DC
  Administered 2021-11-27 – 2021-11-29 (×5): 200 mg via ORAL
  Filled 2021-11-27 (×8): qty 1

## 2021-11-27 MED ORDER — MIDODRINE HCL 5 MG PO TABS
10.0000 mg | ORAL_TABLET | Freq: Three times a day (TID) | ORAL | Status: DC
  Administered 2021-11-27 – 2021-11-29 (×5): 10 mg via ORAL
  Filled 2021-11-27 (×5): qty 2

## 2021-11-27 MED ORDER — PROSOURCE PLUS PO LIQD
30.0000 mL | Freq: Three times a day (TID) | ORAL | Status: DC
  Administered 2021-11-27 – 2021-11-28 (×3): 30 mL via ORAL
  Filled 2021-11-27 (×8): qty 30

## 2021-11-27 MED ORDER — SODIUM CHLORIDE 0.9 % IV SOLN: INTRAVENOUS | Status: AC

## 2021-11-27 NOTE — Assessment & Plan Note (Addendum)
-   Stool testing positive for C. difficile.  Although CT of the abdomen showed features of possible diverticulitis and patient was initially started on IV Zosyn: Zosyn has been discontinued and patient has been started on oral fidaxomicin which will be continued. -Presentation most consistent with stated colitis.  General surgery also agrees.

## 2021-11-27 NOTE — Progress Notes (Signed)
Progress Note   Patient: Danielle [name] FHL:[id number] DOB: [birth year] DOA: 11/26/2021     1 DOS: the patient was seen and examined on 11/27/2021   Brief hospital course: 66 y.o. female with medical history significant of hypertension, hyperlipidemia, COPD on 2 L oxygen, GERD, depression with anxiety, CHF, CKD-4, breast cancer (S/P of the left mastectomy, radiation and chemotherapy), atrial fibrillation on Eliquis, deficiency anemia, aortic stenosis, lymphedema, small bowel obstruction, right colectomy with ileostomy, recent reversal of ileostomy presented with abdominal pain along with diarrhea.  On presentation, WBCs were 16.5, negative urinalysis.  CT of the abdomen and pelvis showed possible diverticulitis.  She was started on broad-spectrum antibiotics.  General surgery was consulted.  Assessment and Plan: C. difficile colitis- (present on admission) - Stool testing positive for C. difficile.  Although CT of the abdomen showed features of possible diverticulitis and patient was initially started on IV Zosyn: We will DC Zosyn and start oral fidaxomicin. -Presentation most consistent with stated colitis.  General surgery also agrees.  Sepsis ([place])- (present on admission) - Possibly from C. difficile colitis.  Diverticulitis has been ruled out.  Presented with leukocytosis, tachycardia from colitis. -Follow blood cultures.  Decrease normal saline to 50 cc an hour  CKD (chronic kidney disease), stage IV ([place])- (present on admission) - Her creatinine on 11/24/2021 on discharge was 2.64 and she was discharged on oral torsemide as per nephrology recommendations -Creatinine 2.32 this morning.  Monitor.  Outpatient follow-up with nephrology  Chronic diastolic CHF (congestive heart failure) ([place])- (present on admission) -Recent echo had shown EF of 60 to 65% with grade 1 diastolic dysfunction - Currently stable.  Strict input and output.  Daily weights.  Fluid restriction.  Continue torsemide and  metolazone as needed.  Outpatient follow-up with cardiology/nephrology.  AF (paroxysmal atrial fibrillation) ([place])- (present on admission) - Currently rate controlled.  Continue metoprolol, diltiazem, amiodarone and Eliquis.  Outpatient follow-up with cardiology  HTN (hypertension)- (present on admission) - Continue metoprolol, diltiazem, torsemide -Blood pressure on the lower side.  Patient also takes midodrine for soft blood pressures.  Depression- (present on admission) - Continue home regimen.  HLD (hyperlipidemia)- (present on admission) -Not taking medications currently -Follow-up with PCP  Iron deficiency anemia- (present on admission) - Not on iron supplementation currently.  Hemoglobin stable.  Moderate COPD (chronic obstructive pulmonary disease) ([place])- (present on admission) - Stable.  Continue bronchodilators as needed.        Subjective:  Patient seen and examined at bedside.  She is slightly better but still complains of abdominal pain and had diarrhea this morning.  No overnight fever or vomiting reported.  Physical Exam: Vitals:   11/26/21 1904 11/27/21 0327 11/27/21 0343 11/27/21 0732  BP: (!) 125/92 (!) 100/56  105/63  Pulse: 82 75  74  Resp: 18 16  16   Temp: 98.5 F (36.9 C) 99.1 F (37.3 C)  99.2 F (37.3 C)  TempSrc: Oral Oral    SpO2: 94% 98%  97%  Weight: 57.4 kg  57.4 kg   Height: 5\' 6"  (1.676 m)      General: No acute distress, currently on room air.  Looks chronically ill and deconditioned. ENT/neck: No elevated JVD.  No obvious masses  respiratory: Bilateral decreased breath sounds at bases with some scattered crackles CVS: S1-S2 heard, rate controlled Abdominal: Soft, mildly tender diffusely, nondistended, no organomegaly, bowel sounds heard Extremities: No cyanosis, clubbing; trace lower extremity edema  CNS: Alert, awake and oriented.  No focal neurologic deficit.  Moving extremities. Lymph: No cervical lymphadenopathy Skin: No  rashes, lesions, ulcers Psych: Affect is mostly flat.  No signs of agitation.   Musculoskeletal: No obvious joint deformity/tenderness/swelling   Data Reviewed: I have reviewed patient's investigations since admission.  Stool tested positive for C. difficile antigen and toxin.  Blood cultures have been negative so far.  Today sodium is 138, 3.4 potassium, creatinine of 2.32, WBC of 14, hemoglobin of 9.3  Family Communication: None at bedside  Disposition: Status is: Inpatient Remains inpatient appropriate because: Diarrhea/abdominal pain and need for treatment for C. difficile colitis and possible gentle IV fluids.     Planned Discharge Destination: Home     Time spent: 50 minutes  Author: Aline August, MD 11/27/2021 10:56 AM  For on call review [email or web address].

## 2021-11-27 NOTE — Assessment & Plan Note (Addendum)
-   Possibly from C. difficile colitis.  Diverticulitis has been ruled out.  Presented with leukocytosis, tachycardia from colitis. -Blood cultures negative so far.  Blood pressure on the lower side.  Received gentle hydration on 11/27/2021.

## 2021-11-27 NOTE — Hospital Course (Addendum)
66 y.o. female with medical history significant of hypertension, hyperlipidemia, COPD on 2 L oxygen, GERD, depression with anxiety, CHF, CKD-4, breast cancer (S/P of the left mastectomy, radiation and chemotherapy), atrial fibrillation on Eliquis, deficiency anemia, aortic stenosis, lymphedema, small bowel obstruction, right colectomy with ileostomy, recent reversal of ileostomy presented with abdominal pain along with diarrhea.  On presentation, WBCs were 16.5, negative urinalysis.  CT of the abdomen and pelvis showed possible diverticulitis.  She was started on broad-spectrum antibiotics.  General surgery was consulted.  Stool tested positive for C. difficile: She was started on fidaxomicin and Zosyn was discontinued.

## 2021-11-27 NOTE — Progress Notes (Signed)
Initial Nutrition Assessment  DOCUMENTATION CODES:   Non-severe (moderate) malnutrition in context of chronic illness  INTERVENTION:   -MVI with minerals daily -30 ml Prosource Plus TID, each supplement provides 100 kcals and 15 grams protien -RD will follow for diet advancement and add supplements as appropriate  NUTRITION DIAGNOSIS:   Moderate Malnutrition related to chronic illness (COPD) as evidenced by mild fat depletion, mild muscle depletion, moderate muscle depletion.  GOAL:   Patient will meet greater than or equal to 90% of their needs  MONITOR:   PO intake, Supplement acceptance, Diet advancement, Labs, Weight trends, Skin, I & O's  REASON FOR ASSESSMENT:   Malnutrition Screening Tool    ASSESSMENT:   Danielle [name] is a 66 y.o. female with medical history significant of hypertension, hyperlipidemia, COPD on 2 L oxygen, GERD, depression with anxiety, CHF, CKD-4, breast cancer (S/P of the left mastectomy, radiation and chemotherapy), atrial fibrillation on Eliquis, deficiency anemia, aortic stenosis, lymphedema, small bowel obstruction, right colectomy with ileostomy, recent reversal of ileostomy, who presents with abdominal pain.  Pt admitted with abdominal pain, sepsis, and acute diverticulitis.    Reviewed I/O's: +50 ml x 24 hours  Spoke with pt at bedside, who complains of abdominal pain. She reports she was able to consume a small bit of coffee, but it hurt when she tried to drink.   Pt familiar to this RD due to multiple prior hospitalizations. Pt reports she was just discharged from the hospital on Friday and had abdominal pain when returning home, which prompted her to come to the hospital.  Pt endorses wt loss, but unsure how much. Reviewed wt hx; wt has been stable over the past 4 months.   Discussed importance of good meal and supplement intake to promote healing. Pt is not fond of most nutritional supplements, which have been trialed during several  admissions. Pt prefers Nepro, but shares that it gives her diarrhea.    Medications reviewed and include celexa and demadex.   Labs reviewed: K: 3.4.    NUTRITION - FOCUSED PHYSICAL EXAM:  Flowsheet Row Most Recent Value  Orbital Region No depletion  Upper Arm Region Mild depletion  Thoracic and Lumbar Region No depletion  Buccal Region Mild depletion  Temple Region Mild depletion  Clavicle Bone Region No depletion  Clavicle and Acromion Bone Region No depletion  Scapular Bone Region No depletion  Dorsal Hand Moderate depletion  Patellar Region Moderate depletion  Anterior Thigh Region Moderate depletion  Posterior Calf Region Moderate depletion  Edema (RD Assessment) None  Hair Reviewed  Eyes Reviewed  Mouth Reviewed  Skin Reviewed  Nails Reviewed       Diet Order:   Diet Order             Diet clear liquid Room service appropriate? Yes; Fluid consistency: Thin; Fluid restriction: 1500 mL Fluid  Diet effective now                   EDUCATION NEEDS:   Education needs have been addressed  Skin:  Skin Assessment: Skin Integrity Issues: Skin Integrity Issues:: Incisions Incisions: closed abdomen  Last BM:  11/26/21  Height:   Ht Readings from Last 1 Encounters:  11/26/21 5\' 6"  (1.676 m)    Weight:   Wt Readings from Last 1 Encounters:  11/27/21 57.4 kg    Ideal Body Weight:  59.1 kg  BMI:  Body mass index is 20.42 kg/m.  Estimated Nutritional Needs:   Kcal:  5427-0623  Protein:  90-105 grams  Fluid:  > 1.7 L    Loistine Chance, RD, LDN, [place] Registered Dietitian II Certified Diabetes Care and Education Specialist Please refer to [facility] for RD and/or RD on-call/weekend/after hours pager

## 2021-11-27 NOTE — Progress Notes (Signed)
Patient ID: Danielle [name], female   DOB: 23-May-1956, 66 y.o.   MRN: [id number]     [place] Hospital Day(s): 1.   Interval History: Patient seen and examined, no acute events or new complaints overnight. Patient reports pain is improving.  Vital signs in last 24 hours: [min-max] current  Temp:  [98.5 F (36.9 C)-99.1 F (37.3 C)] 99.1 F (37.3 C) (02/13 0327) Pulse Rate:  [75-89] 75 (02/13 0327) Resp:  [16-22] 16 (02/13 0327) BP: (92-131)/(56-92) 100/56 (02/13 0327) SpO2:  [94 %-100 %] 98 % (02/13 0327) Weight:  [57.4 kg] 57.4 kg (02/13 0343)     Height: 5\' 6"  (167.6 cm) Weight: 57.4 kg BMI (Calculated): 20.43   Physical Exam:  Constitutional: alert, cooperative and no distress  Gastrointestinal: soft, non-tender, and non-distended  Labs:  CBC Latest Ref Rng & Units 11/27/2021 11/26/2021 11/22/2021  WBC 4.0 - 10.5 K/uL 14.0(H) 16.5(H) 8.2  Hemoglobin 12.0 - 15.0 g/dL 9.3(L) 11.4(L) 9.2(L)  Hematocrit 36.0 - 46.0 % 29.3(L) 36.4 28.9(L)  Platelets 150 - 400 K/uL 316 361 373   CMP Latest Ref Rng & Units 11/27/2021 11/26/2021 11/24/2021  Glucose 70 - 99 mg/dL 101(H) 137(H) 82  BUN 8 - 23 mg/dL 35(H) 41(H) 37(H)  Creatinine 0.44 - 1.00 mg/dL 2.32(H) 2.59(H) 2.64(H)  Sodium 135 - 145 mmol/L 138 132(L) 137  Potassium 3.5 - 5.1 mmol/L 3.4(L) 3.5 3.7  Chloride 98 - 111 mmol/L 98 92(L) 94(L)  CO2 22 - 32 mmol/L 27 25 33(H)  Calcium 8.9 - 10.3 mg/dL 9.0 9.4 9.1  Total Protein 6.5 - 8.1 g/dL - 8.9(H) 7.3  Total Bilirubin 0.3 - 1.2 mg/dL - 0.9 0.4  Alkaline Phos 38 - 126 U/L - 67 60  AST 15 - 41 U/L - 26 17  ALT 0 - 44 U/L - 13 10    Imaging studies: No new pertinent imaging studies   Assessment/Plan:  66 y.o. female with c. Diff colitis, complicated by pertinent comorbidities including COPD, afib, CHF, CKD.   - Consider discontinue of Zosyn since patient has c diff colitis. The diagnosis of c diff explain better her clinical status more than diverticulitis.    -  Continue medical management of c diff with Vanco and medical comorbilities   - May advance diet as tolerated up to low fiber diet   - We will continue to follow along.     Arnold Long, MD

## 2021-11-27 NOTE — Plan of Care (Signed)
  Problem: Education: Goal: Knowledge of General Education information will improve Description: Including pain rating scale, medication(s)/side effects and non-pharmacologic comfort measures Outcome: Progressing   Problem: Clinical Measurements: Goal: Ability to maintain clinical measurements within normal limits will improve Outcome: Progressing   Problem: Clinical Measurements: Goal: Diagnostic test results will improve Outcome: Progressing   Problem: Clinical Measurements: Goal: Respiratory complications will improve Outcome: Progressing   Problem: Clinical Measurements: Goal: Cardiovascular complication will be avoided Outcome: Progressing   

## 2021-11-27 NOTE — Assessment & Plan Note (Signed)
-   Continue home regimen 

## 2021-11-27 NOTE — Assessment & Plan Note (Addendum)
-  Recent echo had shown EF of 60 to 65% with grade 1 diastolic dysfunction - Currently stable.  Strict input and output.  Daily weights.  Fluid restriction.  Hold metoprolol, torsemide for today.  If blood pressure still low, might need gentle hydration again today.  Outpatient follow-up with cardiology/nephrology.

## 2021-11-27 NOTE — TOC Benefit Eligibility Note (Signed)
Patient Teacher, English as a foreign language completed.    The patient is currently admitted and upon discharge could be taking vancomycin 125 mg capsules.  The current 10 day co-pay is, $0.00.   The patient is currently admitted and upon discharge could be taking Dificid 200 mg tablets.  The current 10 day co-pay is, $0.00.   The patient is insured through [employer], [place] Patient Advocate Specialist [place] Patient Advocate Team Direct Number: [phone]  Fax: [phone]

## 2021-11-27 NOTE — Assessment & Plan Note (Addendum)
-   Her creatinine on 11/24/2021 on discharge was 2.64 and she was discharged on oral torsemide as per nephrology recommendations -Creatinine 2.47 this morning.  Monitor.  Diuretic plan as below.  Outpatient follow-up with nephrology

## 2021-11-27 NOTE — Assessment & Plan Note (Signed)
-  Not taking medications currently -Follow-up with PCP

## 2021-11-27 NOTE — Assessment & Plan Note (Addendum)
Hypotension - Blood pressure on the lower side and was dropping to the 70s and 80s on 11/27/2021 requiring IV fluids.  Hold metoprolol, diltiazem and torsemide for today. -I have increased midodrine to 10 mg 3 times a day (was on 5 mg 3 times a day prior to admission)

## 2021-11-27 NOTE — Evaluation (Signed)
Physical Therapy Evaluation Patient Details Name: Danielle [name] MRN: [id number] DOB: 1955/12/09 Today's Date: 11/27/2021  History of Present Illness  66 y.o. female with medical history significant of hypertension, hyperlipidemia, COPD on 2 L oxygen, GERD, depression with anxiety, CHF, CKD-4, breast cancer (S/P of the left mastectomy, radiation and chemotherapy), atrial fibrillation on Eliquis, deficiency anemia, aortic stenosis, lymphedema, small bowel obstruction, right colectomy with ileostomy, recent reversal of ileostomy presented with abdominal pain along with diarrhea, + for cdiff.   Clinical Impression  Patient alert, oriented x4, reported 7/10 abdominal pain, RN notified. The patient was seen earlier this month during a different hospitalization, and pt endorsed she had been doing pretty well prior to developing current symptoms. Home set up remains the same.  The patient was able to move all extremities against gravity. With encouragement, she mobilized to EOB with supervision. Did complain of dizziness within 1 minute of sitting, BP of 79/54. Returned to supine with supervision and further mobility deferred. PT and pt discussed importance of continued mobility with BP control, pt verbalized understanding. Current recommendation is HHPT with intermittent supervision/assistance pending further assessment of pt mobility and function.        Recommendations for follow up therapy are one component of a multi-disciplinary discharge planning process, led by the attending physician.  Recommendations may be updated based on patient status, additional functional criteria and insurance authorization.  Follow Up Recommendations Home health PT    Assistance Recommended at Discharge Intermittent Supervision/Assistance  Patient can return home with the following  A little help with walking and/or transfers;A little help with bathing/dressing/bathroom;Assistance with cooking/housework;Help with  stairs or ramp for entrance    Equipment Recommendations None recommended by PT  Recommendations for Other Services       Functional Status Assessment Patient has had a recent decline in their functional status and demonstrates the ability to make significant improvements in function in a reasonable and predictable amount of time.     Precautions / Restrictions Precautions Precautions: Fall Restrictions Weight Bearing Restrictions: No      Mobility  Bed Mobility Overal bed mobility: Needs Assistance Bed Mobility: Supine to Sit, Sit to Supine     Supine to sit: Supervision Sit to supine: Supervision   General bed mobility comments: use of bed rails, supervision for dizziness    Transfers                        Ambulation/Gait                  Stairs            Wheelchair Mobility    Modified Rankin (Stroke Patients Only)       Balance Overall balance assessment: Needs assistance Sitting-balance support: No upper extremity supported Sitting balance-Leahy Scale: Good                                       Pertinent Vitals/Pain Pain Assessment Pain Assessment: 0-10 Pain Score: 7  Pain Location: abdominal pain Pain Descriptors / Indicators: Aching, Shooting, Sharp Pain Intervention(s): Limited activity within patient's tolerance, Monitored during session, Repositioned, Patient requesting pain meds-RN notified    Home Living Family/patient expects to be discharged to:: Private residence Living Arrangements: Children;Other relatives (granddaughter) Available Help at Discharge: Family   Home Access: Stairs to enter Entrance Stairs-Rails: Right Entrance Stairs-Number of Steps: 4  Home Layout: One level Home Equipment: Cane - single Barista (2 wheels);BSC/3in1      Prior Function Prior Level of Function : Independent/Modified Independent             Mobility Comments: Pt reports that she only  occasionally needs a cane when she has a gout flare-up, can get out of the home PRN       Hand Dominance        Extremity/Trunk Assessment   Upper Extremity Assessment Upper Extremity Assessment: Overall WFL for tasks assessed    Lower Extremity Assessment Lower Extremity Assessment: Overall WFL for tasks assessed (able to move against gravity without assistance)    Cervical / Trunk Assessment Cervical / Trunk Assessment: Kyphotic  Communication   Communication: No difficulties  Cognition Arousal/Alertness: Awake/alert Behavior During Therapy: WFL for tasks assessed/performed Overall Cognitive Status: Within Functional Limits for tasks assessed                                          General Comments      Exercises     Assessment/Plan    PT Assessment Patient needs continued PT services  PT Problem List Decreased strength;Decreased activity tolerance;Decreased balance;Decreased mobility;Decreased knowledge of use of DME;Decreased safety awareness       PT Treatment Interventions DME instruction;Gait training;Stair training;Functional mobility training;Therapeutic activities;Therapeutic exercise;Balance training;Neuromuscular re-education;Patient/family education    PT Goals (Current goals can be found in the Care Plan section)  Acute Rehab PT Goals Patient Stated Goal: to decrease abdominal pain PT Goal Formulation: With patient Time For Goal Achievement: 12/11/21 Potential to Achieve Goals: Good    Frequency Min 2X/week     Co-evaluation               AM-PAC PT "6 Clicks" Mobility  Outcome Measure Help needed turning from your back to your side while in a flat bed without using bedrails?: None Help needed moving from lying on your back to sitting on the side of a flat bed without using bedrails?: None Help needed moving to and from a bed to a chair (including a wheelchair)?: None Help needed standing up from a chair using your arms  (e.g., wheelchair or bedside chair)?: None Help needed to walk in hospital room?: A Little Help needed climbing 3-5 steps with a railing? : A Lot 6 Click Score: 21    End of Session Equipment Utilized During Treatment: Oxygen Activity Tolerance: Other (comment) (limited by dizziness) Patient left: in bed;with call bell/phone within reach;with bed alarm set Nurse Communication: Mobility status (low BP) PT Visit Diagnosis: Muscle weakness (generalized) (M62.81);Difficulty in walking, not elsewhere classified (R26.2)    Time: 5456-2563 PT Time Calculation (min) (ACUTE ONLY): 22 min   Charges:   PT Evaluation $PT Eval Low Complexity: 1 Low PT Treatments $Therapeutic Activity: 8-22 mins      Lieutenant Diego PT, DPT 3:16 PM,11/27/21

## 2021-11-27 NOTE — Assessment & Plan Note (Addendum)
-   Currently rate controlled.  Continue amiodarone and Eliquis.  Hold Cardizem and metoprolol for today.  Outpatient follow-up with cardiology

## 2021-11-27 NOTE — Assessment & Plan Note (Signed)
-   Not on iron supplementation currently.  Hemoglobin stable.

## 2021-11-27 NOTE — Assessment & Plan Note (Signed)
-   Stable.  Continue bronchodilators as needed.

## 2021-11-28 DIAGNOSIS — E876 Hypokalemia: Secondary | ICD-10-CM

## 2021-11-28 LAB — CBC WITH DIFFERENTIAL/PLATELET
Abs Immature Granulocytes: 0.04 10*3/uL (ref 0.00–0.07)
Basophils Absolute: 0.1 10*3/uL (ref 0.0–0.1)
Basophils Relative: 1 %
Eosinophils Absolute: 0.4 10*3/uL (ref 0.0–0.5)
Eosinophils Relative: 4 %
HCT: 27.2 % — ABNORMAL LOW (ref 36.0–46.0)
Hemoglobin: 8.6 g/dL — ABNORMAL LOW (ref 12.0–15.0)
Immature Granulocytes: 0 %
Lymphocytes Relative: 21 %
Lymphs Abs: 1.9 10*3/uL (ref 0.7–4.0)
MCH: 29.7 pg (ref 26.0–34.0)
MCHC: 31.6 g/dL (ref 30.0–36.0)
MCV: 93.8 fL (ref 80.0–100.0)
Monocytes Absolute: 0.8 10*3/uL (ref 0.1–1.0)
Monocytes Relative: 9 %
Neutro Abs: 5.9 10*3/uL (ref 1.7–7.7)
Neutrophils Relative %: 65 %
Platelets: 327 10*3/uL (ref 150–400)
RBC: 2.9 MIL/uL — ABNORMAL LOW (ref 3.87–5.11)
RDW: 15.4 % (ref 11.5–15.5)
WBC: 9.1 10*3/uL (ref 4.0–10.5)
nRBC: 0 % (ref 0.0–0.2)

## 2021-11-28 LAB — BASIC METABOLIC PANEL
Anion gap: 14 (ref 5–15)
BUN: 37 mg/dL — ABNORMAL HIGH (ref 8–23)
CO2: 23 mmol/L (ref 22–32)
Calcium: 9.2 mg/dL (ref 8.9–10.3)
Chloride: 103 mmol/L (ref 98–111)
Creatinine, Ser: 2.47 mg/dL — ABNORMAL HIGH (ref 0.44–1.00)
GFR, Estimated: 21 mL/min — ABNORMAL LOW (ref >60)
Glucose, Bld: 76 mg/dL (ref 70–99)
Potassium: 4.2 mmol/L (ref 3.5–5.1)
Sodium: 140 mmol/L (ref 135–145)

## 2021-11-28 LAB — MAGNESIUM: Magnesium: 2 mg/dL (ref 1.7–2.4)

## 2021-11-28 LAB — GLUCOSE, CAPILLARY: Glucose-Capillary: 84 mg/dL (ref 70–99)

## 2021-11-28 MED ORDER — OXYCODONE HCL 5 MG PO TABS
5.0000 mg | ORAL_TABLET | ORAL | Status: DC | PRN
  Administered 2021-11-28 (×2): 5 mg via ORAL
  Filled 2021-11-28 (×2): qty 1

## 2021-11-28 NOTE — Assessment & Plan Note (Signed)
-   PT recommends home health PT

## 2021-11-28 NOTE — Assessment & Plan Note (Signed)
Resolved

## 2021-11-28 NOTE — Progress Notes (Signed)
Progress Note   Patient: Danielle [name] DOB: 26-Jan-1956 DOA: 11/26/2021     2 DOS: the patient was seen and examined on 11/28/2021   Brief hospital course: 66 y.o. female with medical history significant of hypertension, hyperlipidemia, COPD on 2 L oxygen, GERD, depression with anxiety, CHF, CKD-4, breast cancer (S/P of the left mastectomy, radiation and chemotherapy), atrial fibrillation on Eliquis, deficiency anemia, aortic stenosis, lymphedema, small bowel obstruction, right colectomy with ileostomy, recent reversal of ileostomy presented with abdominal pain along with diarrhea.  On presentation, WBCs were 16.5, negative urinalysis.  CT of the abdomen and pelvis showed possible diverticulitis.  She was started on broad-spectrum antibiotics.  General surgery was consulted.  Stool tested positive for C. difficile: She was started on fidaxomicin and Zosyn was discontinued.  Assessment and Plan: C. difficile colitis- (present on admission) - Stool testing positive for C. difficile.  Although CT of the abdomen showed features of possible diverticulitis and patient was initially started on IV Zosyn: Zosyn has been discontinued and patient has been started on oral fidaxomicin which will be continued. -Presentation most consistent with stated colitis.  General surgery also agrees.  Sepsis ([place])- (present on admission) - Possibly from C. difficile colitis.  Diverticulitis has been ruled out.  Presented with leukocytosis, tachycardia from colitis. -Blood cultures negative so far.  Blood pressure on the lower side.  Received gentle hydration on 11/27/2021.  CKD (chronic kidney disease), stage IV ([place])- (present on admission) - Her creatinine on 11/24/2021 on discharge was 2.64 and she was discharged on oral torsemide as per nephrology recommendations -Creatinine 2.47 this morning.  Monitor.  Diuretic plan as below.  Outpatient follow-up with nephrology  Chronic diastolic CHF (congestive  heart failure) ([place])- (present on admission) -Recent echo had shown EF of 60 to 65% with grade 1 diastolic dysfunction - Currently stable.  Strict input and output.  Daily weights.  Fluid restriction.  Hold metoprolol, torsemide for today.  If blood pressure still low, might need gentle hydration again today.  Outpatient follow-up with cardiology/nephrology.  AF (paroxysmal atrial fibrillation) ([place])- (present on admission) - Currently rate controlled.  Continue amiodarone and Eliquis.  Hold Cardizem and metoprolol for today.  Outpatient follow-up with cardiology  HTN (hypertension)- (present on admission) Hypotension - Blood pressure on the lower side and was dropping to the 70s and 80s on 11/27/2021 requiring IV fluids.  Hold metoprolol, diltiazem and torsemide for today. -I have increased midodrine to 10 mg 3 times a day (was on 5 mg 3 times a day prior to admission)  Depression- (present on admission) - Continue home regimen.  HLD (hyperlipidemia)- (present on admission) -Not taking medications currently -Follow-up with PCP  Iron deficiency anemia- (present on admission) - Not on iron supplementation currently.  Hemoglobin stable.  Moderate COPD (chronic obstructive pulmonary disease) ([place])- (present on admission) - Stable.  Continue bronchodilators as needed.  Hypokalemia- (present on admission) - Resolved  Weakness - PT recommends home health PT  Moderate malnutrition -Follow nutrition recommendations   Subjective:  Patient seen and examined at bedside.  Feels that her diarrhea is improving.  Does not feel well and still complains of intermittent abdominal pain.  Denies worsening shortness of breath, nausea or vomiting.  Physical Exam: Vitals:   11/27/21 2005 11/27/21 2007 11/28/21 0332 11/28/21 0357  BP: (!) 94/57 (!) 102/59 92/61   Pulse: (!) 54  64   Resp: 16     Temp: 97.8 F (36.6 C)  98 F (36.7 C)  TempSrc:   Oral   SpO2: 98%  95%   Weight:    61.9 kg   Height:       General: No acute distress, currently on 2 L oxygen via nasal cannula.  Looks chronically ill and deconditioned. ENT/neck: No elevated JVD.  No obvious masses  respiratory: Bilateral decreased breath sounds at bases with some scattered crackles CVS: S1-S2 heard, rate controlled Abdominal: Soft, mildly tender in the left lower quadrant, nondistended, no organomegaly, bowel sounds heard Extremities: No cyanosis, clubbing; trace lower extremity edema  CNS: Alert, awake and oriented.  No focal neurologic deficit.  Moving extremities. Lymph: No cervical lymphadenopathy Skin: No rashes, lesions, ulcers Psych: Affect is mostly flat.  Currently not agitated.   Musculoskeletal: No obvious joint deformity/tenderness/swelling   Data Reviewed: I have reviewed patient's investigations myself.  Today's sodium is 140, potassium of 4.2, creatinine of 2.47, hemoglobin of 8.6, WBCs of 9.1.  Blood cultures have been negative so far. Stool for C. difficile was positive for antigen and toxin   Family Communication: None at bedside  Disposition: Status is: Inpatient Remains inpatient appropriate because: Of need for blood pressure monitoring and treatment for C. difficile colitis     Planned Discharge Destination: Home with Home Health     Time spent: 50 minutes  Author: Aline August, MD 11/28/2021 7:54 AM  For on call review [email or web address].

## 2021-11-28 NOTE — Progress Notes (Signed)
Physical Therapy Treatment Patient Details Name: Danielle [name] MRN: [id number] DOB: [birth year] Today's Date: 11/28/2021   History of Present Illness 66 y.o. female with medical history significant of hypertension, hyperlipidemia, COPD on 2 L oxygen, GERD, depression with anxiety, CHF, CKD-4, breast cancer (S/P of the left mastectomy, radiation and chemotherapy), atrial fibrillation on Eliquis, deficiency anemia, aortic stenosis, lymphedema, small bowel obstruction, right colectomy with ileostomy, recent reversal of ileostomy presented with abdominal pain along with diarrhea, + for cdiff.    PT Comments    Pt with slow but relatively consistent gait with no overt safety issues or LOBs.  She did need minimal consistent cuing t/o the effort but generally did well.  Pt states she certainly does not feel that she is at her baseline (does not need AD, for example) but felt good about being able to do a prolonged walk.  Pt's O2 did drop to mid 80s even on 2L t/o the effort.    Recommendations for follow up therapy are one component of a multi-disciplinary discharge planning process, led by the attending physician.  Recommendations may be updated based on patient status, additional functional criteria and insurance authorization.  Follow Up Recommendations  Home health PT     Assistance Recommended at Discharge Intermittent Supervision/Assistance  Patient can return home with the following A little help with walking and/or transfers;A little help with bathing/dressing/bathroom;Assistance with cooking/housework;Help with stairs or ramp for entrance   Equipment Recommendations  None recommended by PT    Recommendations for Other Services       Precautions / Restrictions Precautions Precautions: Fall Restrictions Weight Bearing Restrictions: No     Mobility  Bed Mobility Overal bed mobility: Modified Independent Bed Mobility: Supine to Sit, Sit to Supine     Supine to sit:  Supervision Sit to supine: Supervision        Transfers Overall transfer level: Modified independent Equipment used: Rolling walker (2 wheels) Transfers: Sit to/from Stand Sit to Stand: Supervision           General transfer comment: uses B hands for transfer but able to rise w/o assist. Once standing, holds onto RW    Ambulation/Gait Ambulation/Gait assistance: Supervision Gait Distance (Feet): 350 Feet Assistive device: Rolling walker (2 wheels)         General Gait Details: Slow but steady gait circumabulating the nurses' station and down another hallway and back.  No LOBs or overt safety issues but did not appear appropriate to ambualte w/o walker.  2L O2 t/o the effort with minimal subjective fatigue but sats dropping into the mid 80s.  She reports she does not typically need RW or O2 during short bouts of walking.   Stairs             Wheelchair Mobility    Modified Rankin (Stroke Patients Only)       Balance Overall balance assessment: Needs assistance Sitting-balance support: No upper extremity supported Sitting balance-Leahy Scale: Good     Standing balance support: Bilateral upper extremity supported Standing balance-Leahy Scale: Good Standing balance comment: Reliant on the walker in both dynamic and static standing.  No LOBs but far from her baseline w/o AD                            Cognition Arousal/Alertness: Awake/alert Behavior During Therapy: WFL for tasks assessed/performed Overall Cognitive Status: Within Functional Limits for tasks assessed  Exercises      General Comments        Pertinent Vitals/Pain Pain Assessment Pain Assessment: 0-10 Pain Score: 3  Pain Location: abdominal pain    Home Living                          Prior Function            PT Goals (current goals can now be found in the care plan section) Progress towards PT  goals: Progressing toward goals    Frequency    Min 2X/week      PT Plan Current plan remains appropriate    Co-evaluation              AM-PAC PT "6 Clicks" Mobility   Outcome Measure  Help needed turning from your back to your side while in a flat bed without using bedrails?: None Help needed moving from lying on your back to sitting on the side of a flat bed without using bedrails?: None Help needed moving to and from a bed to a chair (including a wheelchair)?: None Help needed standing up from a chair using your arms (e.g., wheelchair or bedside chair)?: None Help needed to walk in hospital room?: None Help needed climbing 3-5 steps with a railing? : A Little 6 Click Score: 23    End of Session Equipment Utilized During Treatment: Oxygen Activity Tolerance: Patient tolerated treatment well Patient left: in bed;with call bell/phone within reach   PT Visit Diagnosis: Muscle weakness (generalized) (M62.81);Difficulty in walking, not elsewhere classified (R26.2)     Time: 1458-1530 PT Time Calculation (min) (ACUTE ONLY): 32 min  Charges:  $Gait Training: 8-22 mins $Therapeutic Exercise: 8-22 mins                     Kreg Shropshire, DPT 11/28/2021, 4:16 PM

## 2021-11-28 NOTE — Progress Notes (Signed)
Patient ID: Danielle [name], female   DOB: 1956-03-30, 66 y.o.   MRN: [id number]     [facility] Day(s): 2.   Interval History: Patient seen and examined, no acute events or new complaints overnight. Patient reports slowly feeling better but still with some discomfort in the left lower quadrant.  Denies fever chills.  Vital signs in last 24 hours: [min-max] current  Temp:  [97.8 F (36.6 C)-98.1 F (36.7 C)] 98.1 F (36.7 C) (02/14 1115) Pulse Rate:  [54-68] 68 (02/14 1115) Resp:  [16-17] 17 (02/14 1115) BP: (92-103)/(55-62) 103/62 (02/14 1115) SpO2:  [95 %-98 %] 98 % (02/14 1115) Weight:  [61.9 kg] 61.9 kg (02/14 0357)     Height: 5\' 6"  (167.6 cm) Weight: 61.9 kg BMI (Calculated): 22.04   Physical Exam:  Constitutional: alert, cooperative and no distress  Respiratory: breathing non-labored at rest  Cardiovascular: regular rate and sinus rhythm  Gastrointestinal: soft, mild-tender, and non-distended  Labs:  CBC Latest Ref Rng & Units 11/28/2021 11/27/2021 11/26/2021  WBC 4.0 - 10.5 K/uL 9.1 14.0(H) 16.5(H)  Hemoglobin 12.0 - 15.0 g/dL 8.6(L) 9.3(L) 11.4(L)  Hematocrit 36.0 - 46.0 % 27.2(L) 29.3(L) 36.4  Platelets 150 - 400 K/uL 327 316 361   CMP Latest Ref Rng & Units 11/28/2021 11/27/2021 11/26/2021  Glucose 70 - 99 mg/dL 76 101(H) 137(H)  BUN 8 - 23 mg/dL 37(H) 35(H) 41(H)  Creatinine 0.44 - 1.00 mg/dL 2.47(H) 2.32(H) 2.59(H)  Sodium 135 - 145 mmol/L 140 138 132(L)  Potassium 3.5 - 5.1 mmol/L 4.2 3.4(L) 3.5  Chloride 98 - 111 mmol/L 103 98 92(L)  CO2 22 - 32 mmol/L 23 27 25   Calcium 8.9 - 10.3 mg/dL 9.2 9.0 9.4  Total Protein 6.5 - 8.1 g/dL - - 8.9(H)  Total Bilirubin 0.3 - 1.2 mg/dL - - 0.9  Alkaline Phos 38 - 126 U/L - - 67  AST 15 - 41 U/L - - 26  ALT 0 - 44 U/L - - 13    Imaging studies: No new pertinent imaging studies   Assessment/Plan:  66 y.o. female with c. Diff colitis, complicated by pertinent comorbidities including COPD, afib, CHF,  CKD.               -I agree with her clinical picture consistent with C. difficile and not diverticulitis            -Agree with discontinuing Zosyn            -Agree with medical management of C. Difficile            -There is improved white blood cell count.  Still with some discomfort in the left lower quadrant.            -I will continue to follow along.        Arnold Long, MD

## 2021-11-29 ENCOUNTER — Other Ambulatory Visit: Payer: Self-pay

## 2021-11-29 LAB — CBC WITH DIFFERENTIAL/PLATELET
Abs Immature Granulocytes: 0.04 10*3/uL (ref 0.00–0.07)
Basophils Absolute: 0.1 10*3/uL (ref 0.0–0.1)
Basophils Relative: 1 %
Eosinophils Absolute: 0.4 10*3/uL (ref 0.0–0.5)
Eosinophils Relative: 6 %
HCT: 27 % — ABNORMAL LOW (ref 36.0–46.0)
Hemoglobin: 8.7 g/dL — ABNORMAL LOW (ref 12.0–15.0)
Immature Granulocytes: 1 %
Lymphocytes Relative: 26 %
Lymphs Abs: 1.8 10*3/uL (ref 0.7–4.0)
MCH: 29.1 pg (ref 26.0–34.0)
MCHC: 32.2 g/dL (ref 30.0–36.0)
MCV: 90.3 fL (ref 80.0–100.0)
Monocytes Absolute: 0.6 10*3/uL (ref 0.1–1.0)
Monocytes Relative: 9 %
Neutro Abs: 3.8 10*3/uL (ref 1.7–7.7)
Neutrophils Relative %: 57 %
Platelets: 325 10*3/uL (ref 150–400)
RBC: 2.99 MIL/uL — ABNORMAL LOW (ref 3.87–5.11)
RDW: 15.5 % (ref 11.5–15.5)
WBC: 6.7 10*3/uL (ref 4.0–10.5)
nRBC: 0 % (ref 0.0–0.2)

## 2021-11-29 LAB — BASIC METABOLIC PANEL
Anion gap: 10 (ref 5–15)
BUN: 36 mg/dL — ABNORMAL HIGH (ref 8–23)
CO2: 26 mmol/L (ref 22–32)
Calcium: 9.3 mg/dL (ref 8.9–10.3)
Chloride: 105 mmol/L (ref 98–111)
Creatinine, Ser: 2.45 mg/dL — ABNORMAL HIGH (ref 0.44–1.00)
GFR, Estimated: 21 mL/min — ABNORMAL LOW (ref >60)
Glucose, Bld: 89 mg/dL (ref 70–99)
Potassium: 3.8 mmol/L (ref 3.5–5.1)
Sodium: 141 mmol/L (ref 135–145)

## 2021-11-29 LAB — MAGNESIUM: Magnesium: 1.9 mg/dL (ref 1.7–2.4)

## 2021-11-29 LAB — GLUCOSE, CAPILLARY: Glucose-Capillary: 86 mg/dL (ref 70–99)

## 2021-11-29 MED ORDER — NEPRO/CARBSTEADY PO LIQD
237.0000 mL | Freq: Two times a day (BID) | ORAL | Status: DC
  Administered 2021-11-29: 237 mL via ORAL

## 2021-11-29 MED ORDER — PROSOURCE PLUS PO LIQD
30.0000 mL | Freq: Two times a day (BID) | ORAL | Status: DC
  Filled 2021-11-29: qty 30

## 2021-11-29 MED ORDER — DIFICID 200 MG PO TABS
200.0000 mg | ORAL_TABLET | Freq: Two times a day (BID) | ORAL | 0 refills | Status: DC
  Filled 2021-11-29: qty 15, 8d supply, fill #0

## 2021-11-29 NOTE — Discharge Summary (Signed)
[facility] Discharge Summary   Patient: Danielle [name] YIR:[id number]  PCP: [facility]  Date of admission: 11/26/2021   Date of discharge: 11/29/2021      Discharge Diagnoses:   Principal Problem:   Abdominal pain Active Problems:   Moderate COPD (chronic obstructive pulmonary disease) (HCC)   Weakness   HTN (hypertension)   AF (paroxysmal atrial fibrillation) (HCC)   Depression   CKD (chronic kidney disease), stage IV (HCC)   Hypokalemia   Sepsis (HCC)   HLD (hyperlipidemia)   Chronic diastolic CHF (congestive heart failure) (HCC)   Iron deficiency anemia   C. difficile colitis   Admitted From: Home Disposition:  Home   Recommendations for Outpatient Follow-up:  PCP: Follow with PCP in 1 week Follow-up with nephrology for CKD stage IV as per schedule Follow up LABS/TEST:     Diet recommendation: Cardiac diet  Activity: The patient is advised to gradually reintroduce usual activities, as tolerated  Discharge Condition: stable  Code Status: Full code   History of present illness: As per the H and P dictated on admission Hospital Course:  66 y.o. female with medical history significant of hypertension, hyperlipidemia, COPD on 2 L oxygen, GERD, depression with anxiety, CHF, CKD-4, breast cancer (S/P of the left mastectomy, radiation and chemotherapy), atrial fibrillation on Eliquis, deficiency anemia, aortic stenosis, lymphedema, small bowel obstruction, right colectomy with ileostomy, recent reversal of ileostomy presented with abdominal pain along with diarrhea.  On presentation, WBCs were 16.5, negative urinalysis.  CT of the abdomen and pelvis showed possible diverticulitis.  She was started on broad-spectrum antibiotics.  General surgery was consulted. Stool tested positive for C. difficile: She was started on fidaxomicin and Zosyn was discontinued. Assessment and Plan: C. difficile colitis- (POA) Stool testing positive for C. difficile.   Although CT of the abdomen showed features of possible diverticulitis and patient was initially started on IV Zosyn: Zosyn was discontinued and patient was started on oral fidaxomicin, was continued on discharge for total 10 days course. Presentation most consistent with stated colitis.  General surgery also agrees.  Patient is to follow-up with general surgery and GI as an outpatient if recurrent C. difficile Sepsis - (POA),  Possibly from C. difficile colitis.  Diverticulitis has been ruled out.  Presented with leukocytosis, tachycardia from colitis. Blood cultures negative so far.  Blood pressure was on the lower side.  Received gentle hydration on 11/27/2021.  BP improved and remained stable on discharge. CKD (chronic kidney disease), stage IV - (POA) - Her creatinine on 11/24/2021 on discharge was 2.64 and she was discharged on oral torsemide as per nephrology recommendations, Creatinine 2.47 --2.45, Outpatient follow-up with nephrology Chronic diastolic CHF (congestive heart failure)-(POA) -Recent echo had shown EF of 60 to 65% with grade 1 diastolic dysfunction - Currently stable. Fluid restriction.  Hold metoprolol, torsemide due to low blood pressure, BP improved after IV fluid given for hydration.  Resumed home medications on discharge.  Outpatient follow-up with cardiology/nephrology. AF (paroxysmal atrial fibrillation)- (POA) - Currently rate controlled.  Continue amiodarone and Eliquis.  Hold Cardizem and metoprolol due to low blood pressure but resumed on discharge.  Outpatient follow-up with cardiology HTN (hypertension)- (POA), patient developed hypotension,  Blood pressure on the lower side and was dropping to the 70s and 80s on 11/27/2021 requiring IV fluids.  Hold metoprolol, diltiazem and torsemide, increased midodrine to 10 mg 3 times a day (was on 5 mg 3 times a day prior to admission).  Blood  pressure improved, midodrine was discontinued and resumed home medications.  Patient was advised  to monitor BP at home and follow with PCP for further management. Depression-(POA), Continue home regimen. HLD (hyperlipidemia)- (POA), Not taking medications currently, Follow-up with PCP Iron deficiency anemia-(POA), - Not on iron supplementation currently.  Hemoglobin stable. Moderate COPD (chronic obstructive pulmonary disease) - (POA) - Stable.  Continue bronchodilators as needed. Hypokalemia- (POA),  Resolved Weakness, PT recommends home health PT Moderate malnutrition, Follow nutrition recommendations    Body mass index is 22.03 kg/m.  Nutrition Problem: Moderate Malnutrition Etiology: chronic illness (COPD) Nutrition Interventions: Interventions: MVI, Prostat, Refer to RD note for recommendations   Patient was seen by physical therapy, who recommended Home health, which was arranged. On the day of the discharge the patient's vitals were stable, and no other acute medical condition were reported by patient. the patient was felt safe to be discharge at Home with Home health.  Consultants: General surgery Procedures: None  Discharge Exam: General: Appear in no distress, no Rash; Oral Mucosa Clear, moist. Cardiovascular: S1 and S2 Present, no Murmur, Respiratory: normal respiratory effort, Bilateral Air entry present and no Crackles, no wheezes Abdomen: Bowel Sound present, Soft and no tenderness, no hernia Extremities: no Pedal edema, no calf tenderness Neurology: alert and oriented to time, place, and person affect appropriate.  Filed Weights   11/26/21 1904 11/27/21 0343 11/28/21 0357  Weight: 57.4 kg 57.4 kg 61.9 kg   Vitals:   11/29/21 0831 11/29/21 1258  BP: 111/76 111/65  Pulse: 70 74  Resp: 16 15  Temp: 98.7 F (37.1 C) 98.2 F (36.8 C)  SpO2: 95% 97%    DISCHARGE MEDICATION: Allergies as of 11/29/2021   No Known Allergies      Medication List     STOP taking these medications    loperamide 2 MG capsule Commonly known as: IMODIUM       TAKE  these medications    acetaminophen 325 MG tablet Commonly known as: TYLENOL Take 2 tablets (650 mg total) by mouth every 6 (six) hours as needed for mild pain (or Fever >/= 101). What changed:  when to take this reasons to take this   albuterol 108 (90 Base) MCG/ACT inhaler Commonly known as: VENTOLIN HFA Inhale 2 puffs into the lungs every 6 (six) hours as needed for wheezing or shortness of breath.   amiodarone 200 MG tablet Commonly known as: PACERONE Take 1 tablet (200 mg total) by mouth 2 (two) times daily.   apixaban 2.5 MG Tabs tablet Commonly known as: ELIQUIS Take 2.5 mg by mouth 2 (two) times daily.   calcium carbonate 500 MG chewable tablet Commonly known as: TUMS - dosed in mg elemental calcium Chew 1 tablet by mouth daily.   citalopram 40 MG tablet Commonly known as: CELEXA Take 40 mg by mouth every morning.   Dificid 200 MG Tabs tablet Generic drug: fidaxomicin Take 1 tablet (200 mg total) by mouth 2 (two) times daily.   Dilt-XR 240 MG 24 hr capsule Generic drug: diltiazem Take 240 mg by mouth daily.   ferrous sulfate 325 (65 FE) MG tablet Take 1 tablet (325 mg total) by mouth 2 (two) times daily with a meal.   Fluticasone-Umeclidin-Vilant 100-62.5-25 MCG/INH Aepb Inhale 1 puff into the lungs every morning.   metoprolol tartrate 25 MG tablet Commonly known as: LOPRESSOR Take 0.5 tablets (12.5 mg total) by mouth 2 (two) times daily.   midodrine 5 MG tablet Commonly known as: PROAMATINE Take  1 tablet (5 mg total) by mouth 3 (three) times daily with meals.   OXYGEN Inhale 2 L into the lungs as needed (shortness of breath).   pantoprazole 40 MG tablet Commonly known as: Protonix Take 1 tablet (40 mg total) by mouth 2 (two) times daily before a meal.   sucralfate 1 GM/10ML suspension Commonly known as: CARAFATE Take 10 mLs (1 g total) by mouth 4 (four) times daily. What changed:  when to take this reasons to take this   torsemide 20 MG  tablet Commonly known as: DEMADEX Take 1 tablet (20 mg total) by mouth daily.   zolpidem 5 MG tablet Commonly known as: AMBIEN Take 5 mg by mouth at bedtime as needed for sleep.       No Known Allergies Discharge Instructions     Call MD for:  persistant nausea and vomiting   Complete by: As directed    Call MD for:  severe uncontrolled pain   Complete by: As directed    Call MD for:  temperature >100.4   Complete by: As directed    Diet - low sodium heart healthy   Complete by: As directed    Discharge instructions   Complete by: As directed    Follow with PCP in 1 week Follow-up with nephrology for CKD stage IV as per schedule   Increase activity slowly   Complete by: As directed    No wound care   Complete by: As directed        The results of significant diagnostics from this hospitalization (including imaging, microbiology, ancillary and laboratory) are listed below for reference.    Significant Diagnostic Studies: CT ABDOMEN PELVIS WO CONTRAST  Result Date: 11/26/2021 CLINICAL DATA:  Abdominal pain. EXAM: CT ABDOMEN AND PELVIS WITHOUT CONTRAST TECHNIQUE: Multidetector CT imaging of the abdomen and pelvis was performed following the standard protocol without IV contrast. RADIATION DOSE REDUCTION: This exam was performed according to the departmental dose-optimization program which includes automated exposure control, adjustment of the mA and/or kV according to patient size and/or use of iterative reconstruction technique. COMPARISON:  11/05/2021. FINDINGS: Lower chest: Small right pleural effusion, decreased compared to the prior CT. No acute findings at the lung bases. Hepatobiliary: Liver normal in size and overall attenuation. 9 mm low-attenuation lesion, segment 4A. Smaller similar appearing low-attenuation lesion, segment 6, both consistent with cysts and both stable. No other liver masses or lesions. Small amount of dependent material in the gallbladder consistent  with sludge or small stones. No wall thickening or inflammation. No bile duct dilation. Pancreas: Unremarkable. No pancreatic ductal dilatation or surrounding inflammatory changes. Spleen: Normal in size without focal abnormality. Adrenals/Urinary Tract: No adrenal masses. Kidneys normal size, orientation and position with no masses, stones or hydronephrosis. Normal ureters. Bladder mostly decompressed, otherwise unremarkable. Stomach/Bowel: Subtle hazy inflammatory type change adjacent to the lower descending colon centered on a posterior diverticulum. The apparent plan tori changes new from prior CT. There is also subtle haziness adjacent to the lower sigmoid colon where there are several diverticula. There is no extraluminal or free air. There are no fluid collections to suggest an abscess. Colon anastomosis staple line noted in the low central to right lower quadrant abdomen. Normal stomach. Small bowel normal in caliber. No small bowel wall thickening or inflammation. Vascular/Lymphatic: Extensive aortic and branch vessel atherosclerotic calcifications. No aneurysm. No enlarged lymph nodes. Reproductive: Uterus normal size. Several small calcifications. No adnexal masses. Other: Ill-defined increased attenuation in the subcutaneous soft tissues  of the left mid abdomen and right lower quadrant, improved compared to the prior CT, consistent with previous ostomy sites or laparoscopic insertion sites. No abdominal wall hernia. No ascites. Musculoskeletal: Mild chronic depression of the upper endplate of L3 and upper endplate of L5. No acute fractures. No bone lesions. IMPRESSION: 1. Subtle haziness is noted adjacent to the lower descending colon, with another area adjacent to the lower sigmoid colon, in both locations there are associated diverticula. Findings may reflect mild uncomplicated diverticulitis in either or both locations. No extraluminal or free air. No evidence of an abscess. 2. No other evidence of an  acute abnormality within the abdomen or pelvis. No evidence of bowel obstruction. 3. Small right pleural effusion decreased in size from the prior CT. 4. Extensive aortic and branch vessel atherosclerosis, also stable. Electronically Signed   By: Lajean Manes M.D.   On: 11/26/2021 15:09   CT ABDOMEN PELVIS WO CONTRAST  Result Date: 11/05/2021 CLINICAL DATA:  Sepsis, fever, postoperative, rule out intra-abdominal abscess EXAM: CT ABDOMEN AND PELVIS WITHOUT CONTRAST TECHNIQUE: Multidetector CT imaging of the abdomen and pelvis was performed following the standard protocol without IV contrast. Oral enteric contrast was administered. RADIATION DOSE REDUCTION: This exam was performed according to the departmental dose-optimization program which includes automated exposure control, adjustment of the mA and/or kV according to patient size and/or use of iterative reconstruction technique. COMPARISON:  10/27/2021 FINDINGS: Lower chest: Small right pleural effusion associated atelectasis or consolidation. Three-vessel coronary artery calcifications. Hepatobiliary: No solid liver abnormality is seen. No gallstones, gallbladder wall thickening, or biliary dilatation. Pancreas: Unremarkable. No pancreatic ductal dilatation or surrounding inflammatory changes. Spleen: Normal in size without significant abnormality. Adrenals/Urinary Tract: Adrenal glands are unremarkable. Kidneys are normal, without renal calculi, solid lesion, or hydronephrosis. Bladder is unremarkable. Stomach/Bowel: Stomach is within normal limits. The mid small bowel is diffusely gas and contrast filled, and mildly distended, largest loops measuring 4.1 cm. Status post right hemicolectomy. Descending and sigmoid diverticula. Vascular/Lymphatic: Aortic atherosclerosis. No enlarged abdominal or pelvic lymph nodes. Reproductive: No mass or other significant abnormality. Other: Anasarca. Persistent, although improved, scattered subcutaneous emphysema about  the abdominal wall (series 2, image 38). Unchanged subcutaneous fluid collections of the left and right ventral abdomen, collection on the left measuring 3.4 x 2.2 cm (series 2, image 36), collection on the right measuring 3.5 x 2.0 cm (series 2, image 43). No ascites. Musculoskeletal: No acute or significant osseous findings. IMPRESSION: 1. Unchanged subcutaneous fluid collections of the left and right ventral abdomen, collection on the left measuring 3.4 x 2.2 cm, collection on the right measuring 3.5 x 2.0 cm. These most likely reflect postoperative hematoma or seroma, although presence or absence of infection within this fluid is not established by CT. 2. Status post right hemicolectomy. 3. The mid small bowel is diffusely gas and contrast filled, and mildly distended, largest loops measuring 4.1 cm. Findings are most consistent with postoperative ileus. 4. Right pleural effusion and anasarca. 5. Coronary artery disease. Aortic Atherosclerosis (ICD10-I70.0). Electronically Signed   By: Delanna Ahmadi M.D.   On: 11/05/2021 17:34   DG Chest 2 View  Result Date: 11/18/2021 CLINICAL DATA:  Admitted for acute kidney injury, continued fatigue. Recent pleural effusion on RIGHT. EXAM: CHEST - 2 VIEW COMPARISON:  Chest x-rays dated 11/16/2021, 11/14/2021, 01/20/2020 and 07/31/2020. FINDINGS: Heart size and mediastinal contours are stable. Chronic scarring is again within the RIGHT perihilar lung. Coarse lung markings are seen throughout both lungs. No confluent opacities to  suggest superimposed pneumonia. Small bilateral pleural effusions, RIGHT greater than LEFT. No pneumothorax is seen. RIGHT-sided PICC line appears well positioned with tip at the level of the lower SVC/cavoatrial junction. No acute or suspicious osseous abnormality. Extensive aortic atherosclerosis. Mildly distended gas-filled loops of small bowel within the upper abdomen, incompletely imaged. IMPRESSION: 1. Small bilateral pleural effusions, RIGHT  greater than LEFT. 2. Coarse lung markings throughout both lungs, compatible with chronic interstitial lung disease/fibrosis. No evidence of superimposed pneumonia or pulmonary edema. 3. Aortic atherosclerosis. 4. Suspect continued postoperative ileus with mildly distended gas-filled loops of small bowel partially imaged within the upper abdomen. Electronically Signed   By: Franki Cabot M.D.   On: 11/18/2021 12:32   DG Chest 2 View  Result Date: 11/09/2021 CLINICAL DATA:  Shortness of breath.  Mild chest pain.  Smoker. EXAM: CHEST - 2 VIEW COMPARISON:  11/05/2021 FINDINGS: Right PICC tip in the region of the inferior aspect of the superior vena cava without significant change. The nasogastric tube has been removed. Normal sized heart. Stable linear scarring in the right upper lobe. No significant change in a small right pleural effusion and right basilar airspace opacity. Clear left lung. Mildly tortuous and calcified thoracic aorta. No acute bony abnormality. IMPRESSION: Stable small right pleural effusion and right basilar atelectasis and/or pneumonia. Electronically Signed   By: Claudie Revering M.D.   On: 11/09/2021 10:17   DG Abd 1 View  Result Date: 11/03/2021 CLINICAL DATA:  NG tube placement EXAM: ABDOMEN - 1 VIEW COMPARISON:  11/03/2021 11:05 a.m. FINDINGS: Advancement of NG tube with tip and side port below the GE junction. Redemonstrated small bowel dilatation, not significantly changed from the prior exam. The stomach is not distended. The lower abdomen is not included for evaluation. IMPRESSION: Interval advancement of nasogastric tube, with tip and side port overlying the stomach. Overall unchanged small bowel dilatation in the imaged portion of the abdomen. Electronically Signed   By: Merilyn Baba M.D.   On: 11/03/2021 16:01   CT CHEST WO CONTRAST  Result Date: 11/22/2021 CLINICAL DATA:  Pneumonia. Pleural effusion suspected. Evaluate lungs for fluid. EXAM: CT CHEST WITHOUT CONTRAST  TECHNIQUE: Multidetector CT imaging of the chest was performed following the standard protocol without IV contrast. RADIATION DOSE REDUCTION: This exam was performed according to the departmental dose-optimization program which includes automated exposure control, adjustment of the mA and/or kV according to patient size and/or use of iterative reconstruction technique. COMPARISON:  AP chest 11/21/2021; chest two views 11/18/2021; CT chest 11/06/2019 and 11/19/2018 FINDINGS: Cardiovascular: Heart size is moderately enlarged similar to prior. Mild pericardial effusion is new from 11/06/2019. Dense coronary artery calcifications are seen. The ascending aorta measures up to 3.4 cm in caliber, ectatic but not aneurysmal. Moderate to high-grade atherosclerotic calcifications of the thoracic aorta. Mediastinum/Nodes: No axillary, mediastinal, or hilar pathologically enlarged lymph nodes by CT criteria. Visualized thyroid gland is grossly unremarkable. The esophagus follows a normal course of normal caliber. Lungs/Pleura: The central airways are patent. Moderate to high-grade centrilobular emphysematous changes are again seen. Moderate apical paraseptal emphysematous changes. A right upper lobe opacity on the prior CT series 6, image 21 appears to have resolved. Contiguous more inferior right upper lobe more superior (axial image 56 and sagittal image 51) and inferior (axial image 68 and sagittal image 56) pulmonary opacities extending to the right hilum appears similar to prior with the more inferior opacity that contacts the anterior lung subjectively appearing thinner and with "less fullness" (axial images  62 through 47). Benign partially calcified 3 mm granuloma within the medial left lower lobe (axial series 3, image 69).a mild anterior left upper lobe subpleural thickening is unchanged (axial series 3, image 70). Moderate to high-grade right and mild to moderate left pleural effusions, increased from 11/06/2019. Upper  Abdomen: Unchanged fluid density possible 11 mm cyst within the anterior right hepatic lobe. Musculoskeletal: Mild-to-moderate multilevel degenerative disc changes of the thoracic spine. Minimal dextrocurvature of the midthoracic spine. Old healed anterior left second rib fracture. IMPRESSION:: IMPRESSION: 1. Moderate cardiomegaly, unchanged.  New mild pericardial effusion. 2. Coronary artery calcifications, an indicator of coronary artery disease. 3. Moderate to high-grade centrilobular emphysematous changes. 4. Moderate to high-grade right and mild-to-moderate left pleural effusions, increased from 11/06/2019. 5. Resolution of a superior right upper lobe opacity seen on prior 11/06/2019 CT. 6. 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 Aortic Atherosclerosis (ICD10-I70.0) and Emphysema (ICD10-J43.9). Electronically Signed   By: Yvonne Kendall M.D.   On: 11/22/2021 10:22   DG Chest Port 1 View  Result Date: 11/22/2021 CLINICAL DATA:  Status post right thoracentesis. EXAM: PORTABLE CHEST 1 VIEW COMPARISON:  Earlier today. FINDINGS: Interval decrease in right basilar pleural fluid with no pneumothorax seen. Resolved atelectasis at the right lung base. Stable minimal left pleural effusion. Stable enlarged cardiac silhouette, tortuous and calcified thoracic aorta and linear scarring in the right upper lung zone. Right PICC tip in the region of the superior cavoatrial junction/inferior aspect of the superior vena cava. Mild increase in mild linear atelectasis at the medial left lung base. No acute bony abnormality. IMPRESSION: 1. Decreased right pleural fluid without pneumothorax following thoracentesis. 2. Resolved right basilar atelectasis. 3. Mild increase in mild left lower lobe atelectasis. 4. Minimal left pleural effusion, small residual right pleural effusion and stable right upper lung  zone scarring. Electronically Signed   By: Claudie Revering M.D.   On: 11/22/2021 16:42   DG Chest Port 1 View  Result Date: 11/21/2021 CLINICAL DATA:  Pulmonary edema, COPD EXAM: PORTABLE CHEST 1 VIEW COMPARISON:  11/18/2021 FINDINGS: Stable positioning of a right-sided PICC line. Stable mild cardiomegaly. Aortic atherosclerosis. Chronic scarring in the right perihilar region. Increasing right lower lobe airspace opacity. Small right-sided pleural effusion and trace left pleural effusion. No pneumothorax. IMPRESSION: 1. Worsening right lower lobe airspace opacity, suspicious for pneumonia. 2. Small right and trace left pleural effusions. Electronically Signed   By: Davina Poke D.O.   On: 11/21/2021 13:11   DG Chest Port 1 View  Result Date: 11/16/2021 CLINICAL DATA:  Shortness of breath EXAM: PORTABLE CHEST 1 VIEW COMPARISON:  Previous studies including the examination of 11/14/2021 FINDINGS: Transverse diameter of heart is increased. Central pulmonary vessels are prominent. Linear densities in the right parahilar region have not changed significantly. Small bilateral pleural effusions are seen, more so on the right side. Crowding of markings in the medial lower lung fields suggest atelectasis/pneumonia. There is no pneumothorax. Tip of PICC line is seen in superior vena cava close to right atrium. IMPRESSION: Cardiomegaly. Central pulmonary vessels are prominent. Prominence of interstitial markings in the right parahilar region and both lower lung fields may suggest interstitial edema or pneumonia. Small bilateral pleural effusions, more so on the right side. Electronically Signed   By: Elmer Picker M.D.   On: 11/16/2021 08:13   DG Chest Port 1 View  Result Date: 11/14/2021 CLINICAL DATA:  Atelectasis, history of small-bowel obstruction EXAM: PORTABLE CHEST 1 VIEW COMPARISON:  11/13/2021 FINDINGS: No significant change in AP portable chest radiograph, with cardiomegaly and right upper extremity  PICC. Diffuse bilateral interstitial pulmonary opacity, small bilateral pleural effusions, and bandlike scarring or atelectasis of the right midlung, unchanged. IMPRESSION: No significant change in AP portable chest radiograph, with cardiomegaly, probable edema, and bilateral pleural effusions. No new airspace opacity. Electronically Signed   By: Delanna Ahmadi M.D.   On: 11/14/2021 09:58   DG Chest Port 1 View  Result Date: 11/13/2021 CLINICAL DATA:  Post right thoracentesis EXAM: PORTABLE CHEST 1 VIEW COMPARISON:  Chest x-ray dated November 12, 2021 FINDINGS: Cardiac and mediastinal contours are unchanged. Linear nodular opacity of the right upper lobe, unchanged from prior exam. No new parenchymal opacity. Interval decreased size of small right pleural effusion. No evidence pneumothorax. IMPRESSION: No evidence of pneumothorax. Electronically Signed   By: Yetta Glassman M.D.   On: 11/13/2021 11:46   DG Chest Port 1 View  Result Date: 11/12/2021 CLINICAL DATA:  Acute respiratory failure with hypoxia. EXAM: PORTABLE CHEST 1 VIEW COMPARISON:  November 09, 2021 FINDINGS: Right upper extremity PICC with tip near the superior cavoatrial junction. The heart size and mediastinal contours are within normal limits. Stable linear scarring in the right upper lobe. Slightly increased small right pleural effusion with adjacent airspace opacities. Increased conspicuity of the irregular nodular areas of consolidation in the right mid lung. The visualized skeletal structures are unremarkable. IMPRESSION: 1. Slightly increased small right pleural effusion with adjacent airspace opacities. 2. Increased conspicuity of the irregular nodular areas of consolidation in the right mid lung. Electronically Signed   By: Dahlia Bailiff M.D.   On: 11/12/2021 08:55   DG Chest Port 1 View  Result Date: 11/05/2021 CLINICAL DATA:  Fever. EXAM: PORTABLE CHEST 1 VIEW COMPARISON:  Chest radiograph 10/26/2021. FINDINGS: Right upper  extremity PICC line tip projects over the superior vena cava. Enteric tube is present. The side port is at the level of the distal esophagus. Stable enlarged cardiac and mediastinal contours. Similar-appearing distortion and consolidation within the mid right lung. Bibasilar atelectasis, right-greater-than-left. Probable small right pleural effusion. No pneumothorax. IMPRESSION: Enteric tube side-port at the distal esophagus, recommend advancement. Right upper extremity PICC line tip projects over the superior vena cava. Similar-appearing irregular and nodular areas of consolidation within the right mid lung. These results will be called to the ordering clinician or representative by the Radiologist Assistant, and communication documented in the PACS or [employer]. Electronically Signed   By: Lovey Newcomer M.D.   On: 11/05/2021 08:04   DG Abd 2 Views  Result Date: 11/06/2021 CLINICAL DATA:  Abdominal pain EXAM: ABDOMEN - 2 VIEW COMPARISON:  Abdominal x-ray 11/03/2021 FINDINGS: Enteric tube identified with the tip in the lower esophagus. Mildly distended loops of small bowel are again seen throughout the abdomen measuring up to 3.3 cm in diameter. No free air identified on the decubitus view. No suspicious calcifications identified. IMPRESSION: 1. Enteric tube tip is in the lower esophagus, advancement recommended. 2. Mildly distended loops of bowel throughout the abdomen which could be seen with ileus or obstruction. Continued follow-up recommended. Electronically Signed   By: Ofilia Neas M.D.   On: 11/06/2021 10:42   DG Abd 2 Views  Result Date: 11/03/2021 CLINICAL DATA:  Abdominal distention EXAM: ABDOMEN - 2 VIEW COMPARISON:  Previous studies including the examination of 11/02/2021 FINDINGS: There is interval worsening of small bowel dilation measuring  up to 4.9 cm in diameter. Tip enteric tube is noted at the gastroesophageal junction pointing cephalad. There is no distention of stomach. There  is interval passage of contrast from the colon. There is evidence of previous tubal ligation. IMPRESSION: There is interval increase in small bowel dilation suggesting high-grade small bowel obstruction. Tip of enteric tube is at the gastroesophageal junction pointing cephalad. NG tube should be advanced 5-10 cm to place the tip and side port within the stomach. Electronically Signed   By: Elmer Picker M.D.   On: 11/03/2021 12:36   DG Abd Portable 1V-Small Bowel Obstruction Protocol-initial, 8 hr delay  Result Date: 11/02/2021 CLINICAL DATA:  8 hour follow-up small-bowel delay EXAM: PORTABLE ABDOMEN - 1 VIEW COMPARISON:  Film from the previous day. FINDINGS: Gastric catheter is noted within the stomach. Contrast material administered now lies within the dilated small bowel as well as within the colon. These changes are consistent with a partial small bowel obstruction. No free air is noted. IMPRESSION: Passage of contrast into the colon consistent with a partial small bowel obstruction. Electronically Signed   By: Inez Catalina M.D.   On: 11/02/2021 03:13   DG Abd Portable 1V  Result Date: 11/01/2021 CLINICAL DATA:  Enteric catheter placement EXAM: PORTABLE ABDOMEN - 1 VIEW COMPARISON:  10/31/2021 FINDINGS: Frontal view of the lower chest and upper abdomen demonstrates enteric catheter tip and side port projecting over the gastric fundus. Right-sided PICC tip overlies atriocaval junction. Cardiac silhouette is enlarged. Small right pleural effusion. Stable vascular congestion. Stable distended gas-filled loops of small bowel, likely ileus given recent surgery. IMPRESSION: 1. Enteric catheter projecting over gastric fundus. 2. Stable small bowel dilatation consistent with ileus. Electronically Signed   By: Randa Ngo M.D.   On: 11/01/2021 16:02   [place] EKG SITE RITE  Result Date: 11/01/2021 If Site Rite image not attached, placement could not be confirmed due to current cardiac rhythm.  US  THORACENTESIS ASP PLEURAL SPACE W/IMG GUIDE  Result Date: 11/23/2021 INDICATION: Patient with history of CKD, COPD, admitted s/p ileostomy reversal now with persistent right pleural effusion. Request made for therapeutic right thoracentesis. EXAM: ULTRASOUND GUIDED RIGHT THORACENTESIS MEDICATIONS: 10 mL 1% lidocaine COMPLICATIONS: None immediate. PROCEDURE: An ultrasound guided thoracentesis was thoroughly discussed with the patient and questions answered. The benefits, risks, alternatives and complications were also discussed. The patient understands and wishes to proceed with the procedure. Written consent was obtained. Ultrasound was performed to localize and mark an adequate pocket of fluid in the right chest. The area was then prepped and draped in the normal sterile fashion. 1% Lidocaine was used for local anesthesia. Under ultrasound guidance a 6 Fr Safe-T-Centesis catheter was introduced. Thoracentesis was performed. The catheter was removed and a dressing applied. FINDINGS: A total of approximately 550 mL of amber fluid was removed. IMPRESSION: Successful ultrasound guided right therapeutic thoracentesis yielding 550 mL of pleural fluid. Read by: Brynda Greathouse PA-C Electronically Signed   By: Albin Felling M.D.   On: 11/23/2021 07:43   US THORACENTESIS ASP PLEURAL SPACE W/IMG GUIDE  Result Date: 11/13/2021 INDICATION: Right pleural effusion with shortness breath request received for diagnostic and therapeutic thoracentesis. EXAM: ULTRASOUND GUIDED RIGHT THORACENTESIS MEDICATIONS: Local 1% lidocaine only. COMPLICATIONS: None immediate. PROCEDURE: An ultrasound guided thoracentesis was thoroughly discussed with the patient and questions answered. The benefits, risks, alternatives and complications were also discussed. The patient understands and wishes to proceed with the procedure. Written consent was obtained. Ultrasound was performed to localize  and mark an adequate pocket of fluid in the right  chest. The area was then prepped and draped in the normal sterile fashion. 1% Lidocaine was used for local anesthesia. Under ultrasound guidance a 19 gauge, 7-cm, Yueh catheter was introduced. Thoracentesis was performed. The catheter was removed and a dressing applied. FINDINGS: A total of approximately 1 L of amber colored fluid was removed. Samples were sent to the laboratory as requested by the clinical team. IMPRESSION: Successful ultrasound guided right thoracentesis yielding 1 L of pleural fluid. This exam was performed by Tsosie Billing PA-C, and was supervised and interpreted by Dr. Denna Haggard. Electronically Signed   By: Albin Felling M.D.   On: 11/13/2021 12:05    Microbiology: Recent Results (from the past 240 hour(s))  Resp Panel by RT-PCR (Flu A&B, Covid) Nasopharyngeal Swab     Status: None   Collection Time: 11/26/21  4:01 PM   Specimen: Nasopharyngeal Swab; Nasopharyngeal(NP) swabs in vial transport medium  Result Value Ref Range Status   SARS Coronavirus 2 by RT PCR NEGATIVE NEGATIVE Final    Comment: (NOTE) SARS-CoV-2 target nucleic acids are NOT DETECTED.  The SARS-CoV-2 RNA is generally detectable in upper respiratory specimens during the acute phase of infection. The lowest concentration of SARS-CoV-2 viral copies this assay can detect is 138 copies/mL. A negative result does not preclude SARS-Cov-2 infection and should not be used as the sole basis for treatment or other patient management decisions. A negative result may occur with  improper specimen collection/handling, submission of specimen other than nasopharyngeal swab, presence of viral mutation(s) within the areas targeted by this assay, and inadequate number of viral copies(<138 copies/mL). A negative result must be combined with clinical observations, patient history, and epidemiological information. The expected result is Negative.  Fact Sheet for Patients:  EntrepreneurPulse.com.au  Fact  Sheet for Healthcare Providers:  IncredibleEmployment.be  This test is no t yet approved or cleared by the [place] FDA and  has been authorized for detection and/or diagnosis of SARS-CoV-2 by FDA under an Emergency Use Authorization (EUA). This EUA will remain  in effect (meaning this test can be used) for the duration of the COVID-19 declaration under Section 564(b)(1) of the Act, 21 U.S.C.section 360bbb-3(b)(1), unless the authorization is terminated  or revoked sooner.       Influenza A by PCR NEGATIVE NEGATIVE Final   Influenza B by PCR NEGATIVE NEGATIVE Final    Comment: (NOTE) The Xpert Xpress SARS-CoV-2/FLU/RSV plus assay is intended as an aid in the diagnosis of influenza from Nasopharyngeal swab specimens and should not be used as a sole basis for treatment. Nasal washings and aspirates are unacceptable for Xpert Xpress SARS-CoV-2/FLU/RSV testing.  Fact Sheet for Patients: EntrepreneurPulse.com.au  Fact Sheet for Healthcare Providers: IncredibleEmployment.be  This test is not yet approved or cleared by the [place] FDA and has been authorized for detection and/or diagnosis of SARS-CoV-2 by FDA under an Emergency Use Authorization (EUA). This EUA will remain in effect (meaning this test can be used) for the duration of the COVID-19 declaration under Section 564(b)(1) of the Act, 21 U.S.C. section 360bbb-3(b)(1), unless the authorization is terminated or revoked.  Performed at [facility], [address] [place], [place] [address]   Culture, blood (x 2)     Status: None (Preliminary result)   Collection Time: 11/26/21  5:30 PM   Specimen: BLOOD  Result Value Ref Range Status   Specimen Description BLOOD BLOOD RIGHT HAND  Final   Special  Requests   Final    BOTTLES DRAWN AEROBIC AND ANAEROBIC Blood Culture adequate volume   Culture   Final    NO GROWTH 4 DAYS Performed at [facility], [place], [place] [address]    Report Status PENDING  Incomplete  C Difficile Quick Screen w PCR reflex     Status: Abnormal   Collection Time: 11/26/21  6:49 PM   Specimen: STOOL  Result Value Ref Range Status   C Diff antigen POSITIVE (A) NEGATIVE Final   C Diff toxin POSITIVE (A) NEGATIVE Final   C Diff interpretation Toxin producing C. difficile detected.  Final    Comment: CRITICAL RESULT CALLED TO, READ BACK BY AND VERIFIED WITH: MARISSA BLETSOE 11/26/21 @ 1939 BY SB Performed at [facility], [address] [place], [place] [address]   Culture, blood (x 2)     Status: None (Preliminary result)   Collection Time: 11/26/21  7:22 PM   Specimen: BLOOD  Result Value Ref Range Status   Specimen Description BLOOD BLOOD RIGHT HAND  Final   Special Requests   Final    BOTTLES DRAWN AEROBIC AND ANAEROBIC Blood Culture adequate volume   Culture   Final    NO GROWTH 4 DAYS Performed at [facility], [place], [place] [address]    Report Status PENDING  Incomplete     Labs: CBC: Recent Labs  Lab 11/26/21 1305 11/27/21 0409 11/28/21 0527 11/29/21 0639  WBC 16.5* 14.0* 9.1 6.7  NEUTROABS  --   --  5.9 3.8  HGB 11.4* 9.3* 8.6* 8.7*  HCT 36.4 29.3* 27.2* 27.0*  MCV 96.3 93.3 93.8 90.3  PLT 361 316 327 884   Basic Metabolic Panel: Recent Labs  Lab 11/24/21 0550 11/26/21 1305 11/27/21 0409 11/28/21 0527 11/29/21 0639  NA 137 132* 138 140 141  K 3.7 3.5 3.4* 4.2 3.8  CL 94* 92* 98 103 105  CO2 33* 25 27 23 26   GLUCOSE 82 137* 101* 76 89  BUN 37* 41* 35* 37* 36*  CREATININE 2.64* 2.59* 2.32* 2.47* 2.45*  CALCIUM 9.1 9.4 9.0 9.2 9.3  MG 1.9  --   --  2.0 1.9   Liver Function Tests: Recent Labs  Lab 11/24/21 0550 11/26/21 1305  AST 17 26  ALT 10 13  ALKPHOS 60 67  BILITOT 0.4 0.9  PROT 7.3 8.9*  ALBUMIN 3.1* 3.9   Recent Labs  Lab 11/26/21 1305  LIPASE 23   No results for input(s): AMMONIA  in the last 168 hours. Cardiac Enzymes: No results for input(s): CKTOTAL, CKMB, CKMBINDEX, TROPONINI in the last 168 hours. BNP (last 3 results) Recent Labs    11/26/21 1305  BNP 624.9*   CBG: Recent Labs  Lab 11/27/21 0805 11/28/21 0924 11/29/21 0836  GLUCAP 73 84 86    Time spent: 35 minutes  Signed:  Val Riles  [facility] 11/29/2021   6:27 PM

## 2021-11-29 NOTE — TOC Transition Note (Signed)
Transition of Care [facility]) - CM/SW Discharge Note   Patient Details  Name: Danielle [name] MRN: [id number] Date of Birth: 03-30-1956  Transition of Care [facility]) CM/SW Contact:  Eileen Stanford, LCSW Phone Number: 11/29/2021, 10:53 AM   Clinical Narrative:   CSW spoke with pt and pt is declining HH stating she does not think she needs it. Pt states her son and granddaughter live with her. Pt states she has 02 through [employer] at home (2L is baseline). Pt states she has a walker, bedside commode, and crutches at home as well. Pt states she has transport through [facility] and [place] for her appointments and pt declines any concerns about getting to and from appointments. Pt states she uses [facility] pharmacy to get meds and also sees her PCP there. No additional needs noted.     Final next level of care: Home/Self Care Barriers to Discharge: Continued Medical Work up   Patient Goals and CMS Choice Patient states their goals for this hospitalization and ongoing recovery are:: to return home   Choice offered to / list presented to : Patient  Discharge Placement                       Discharge Plan and Services In-house Referral: Clinical Social Work   Post Acute Care Choice: NA                               Social Determinants of Health (SDOH) Interventions     Readmission Risk Interventions Readmission Risk Prevention Plan 11/29/2021 10/16/2019 09/28/2019  Transportation Screening Complete Complete Complete  Medication Review Press photographer) Complete Referral to Pharmacy Complete  PCP or Specialist appointment within 3-5 days of discharge Complete - -  HRI or [place] Complete Complete Patient refused  SW Recovery Care/Counseling Consult Complete Complete Complete  Palliative Care Screening Not Applicable Not Applicable Not [place] Not Applicable Not Applicable Not Applicable  Some recent data might be hidden

## 2021-11-29 NOTE — Progress Notes (Signed)
Nutrition Follow-up  DOCUMENTATION CODES:   Non-severe (moderate) malnutrition in context of chronic illness  INTERVENTION:   -MVI with minerals daily -30 ml Prosource Plus BID, each supplement provides 100 kcals and 15 grams protien -Nepro Shake po BID, each supplement provides 425 kcal and 19 grams protein   NUTRITION DIAGNOSIS:   Moderate Malnutrition related to chronic illness (COPD) as evidenced by mild fat depletion, mild muscle depletion, moderate muscle depletion.  Ongoing  GOAL:   Patient will meet greater than or equal to 90% of their needs  Progressing   MONITOR:   PO intake, Supplement acceptance, Diet advancement, Labs, Weight trends, Skin, I & O's  REASON FOR ASSESSMENT:   Malnutrition Screening Tool    ASSESSMENT:   Danielle [name] is a 66 y.o. female with medical history significant of hypertension, hyperlipidemia, COPD on 2 L oxygen, GERD, depression with anxiety, CHF, CKD-4, breast cancer (S/P of the left mastectomy, radiation and chemotherapy), atrial fibrillation on Eliquis, deficiency anemia, aortic stenosis, lymphedema, small bowel obstruction, right colectomy with ileostomy, recent reversal of ileostomy, who presents with abdominal pain.  2/14- advanced to soft diet   Reviewed I/O's: -97 ml x 24 hours   Pt unavailable at time of visit. Attempted to speak with pt via call to hospital room phone, however, unable to reach.   Pt advanced to a soft diet yesterday . No meal completion data currently available to assess. Per MD, pt still complains of abdominal pain.   RD will add Nepro supplement, as pt has liked this in the past.   Labs reviewed: CBGS: 73-86.   Diet Order:   Diet Order             DIET SOFT Room service appropriate? Yes; Fluid consistency: Thin  Diet effective now                   EDUCATION NEEDS:   Education needs have been addressed  Skin:  Skin Assessment: Skin Integrity Issues: Skin Integrity Issues::  Incisions Incisions: closed abdomen  Last BM:  11/27/21  Height:   Ht Readings from Last 1 Encounters:  11/26/21 5\' 6"  (1.676 m)    Weight:   Wt Readings from Last 1 Encounters:  11/28/21 61.9 kg    Ideal Body Weight:  59.1 kg  BMI:  Body mass index is 22.03 kg/m.  Estimated Nutritional Needs:   Kcal:  1700-1900  Protein:  90-105 grams  Fluid:  > 1.7 L    Loistine Chance, RD, LDN, [place] Registered Dietitian II Certified Diabetes Care and Education Specialist Please refer to [facility] for RD and/or RD on-call/weekend/after hours pager

## 2021-11-29 NOTE — Care Management Important Message (Signed)
Important Message  Patient Details  Name: Danielle [name] MRN: [id number] Date of Birth: [birth year]   Medicare Important Message Given:  Yes  Patient is in an isolation room so I reviewed the Important Message from Medicare with her over the phone [phone]).  She said she was in agreement with the discharge home.  I wished her a speedy recovery and thanked her for her time.    Juliann Pulse A Kendle Turbin 11/29/2021, 1:40 PM

## 2021-11-29 NOTE — Progress Notes (Signed)
Patient oxygen level dropped to 85 percent when nurse, took patient off of oxygen  to help her get dress. Nurse informed patient that her daughter must bring her oxygen tank from home to take the hour drive back to her residence, since patient refused help from the Social worker in obtaining needed equipment for home.The daughter agree to bring so the patient could be discharge today.

## 2021-11-29 NOTE — Progress Notes (Signed)
Pt was given verbal and written discharge instructions, she acknowledges treatment plan. Pt is waiting for ride to go home.

## 2021-11-29 NOTE — Progress Notes (Signed)
Patient ID: Danielle [name], female   DOB: 09-02-1956, 66 y.o.   MRN: [id number]     [facility] Day(s): 3.   Interval History: Patient seen and examined, no acute events or new complaints overnight. Patient reports feeling better today.  She stated that she feels more comfortable with minimal abdominal pain.  She tolerated diet without abdominal pain.  Vital signs in last 24 hours: [min-max] current  Temp:  [97.8 F (36.6 C)-98.7 F (37.1 C)] 98.7 F (37.1 C) (02/15 0831) Pulse Rate:  [66-85] 70 (02/15 0831) Resp:  [16-17] 16 (02/15 0831) BP: (99-117)/(57-76) 111/76 (02/15 0831) SpO2:  [93 %-99 %] 95 % (02/15 0831)     Height: 5\' 6"  (167.6 cm) Weight: 61.9 kg BMI (Calculated): 22.04   Physical Exam:  Constitutional: alert, cooperative and no distress  Gastrointestinal: soft, non-tender, and non-distended  Labs:  CBC Latest Ref Rng & Units 11/29/2021 11/28/2021 11/27/2021  WBC 4.0 - 10.5 K/uL 6.7 9.1 14.0(H)  Hemoglobin 12.0 - 15.0 g/dL 8.7(L) 8.6(L) 9.3(L)  Hematocrit 36.0 - 46.0 % 27.0(L) 27.2(L) 29.3(L)  Platelets 150 - 400 K/uL 325 327 316   CMP Latest Ref Rng & Units 11/29/2021 11/28/2021 11/27/2021  Glucose 70 - 99 mg/dL 89 76 101(H)  BUN 8 - 23 mg/dL 36(H) 37(H) 35(H)  Creatinine 0.44 - 1.00 mg/dL 2.45(H) 2.47(H) 2.32(H)  Sodium 135 - 145 mmol/L 141 140 138  Potassium 3.5 - 5.1 mmol/L 3.8 4.2 3.4(L)  Chloride 98 - 111 mmol/L 105 103 98  CO2 22 - 32 mmol/L 26 23 27   Calcium 8.9 - 10.3 mg/dL 9.3 9.2 9.0  Total Protein 6.5 - 8.1 g/dL - - -  Total Bilirubin 0.3 - 1.2 mg/dL - - -  Alkaline Phos 38 - 126 U/L - - -  AST 15 - 41 U/L - - -  ALT 0 - 44 U/L - - -    Imaging studies: No new pertinent imaging studies   Assessment/Plan:  66 y.o. female with c. Diff colitis, complicated by pertinent comorbidities including COPD, afib, CHF, CKD.               -I agree with her clinical picture consistent with C. difficile and not diverticulitis             -Agree with discontinuing Zosyn            -Agree with medical management of C. Difficile            -Today white blood cell count are 6.7.  Improved abdominal pain.  Tolerating soft diet.            -I will continue to follow along.  Arnold Long, MD

## 2021-12-01 LAB — CULTURE, BLOOD (ROUTINE X 2)
Culture: NO GROWTH
Culture: NO GROWTH
Special Requests: ADEQUATE
Special Requests: ADEQUATE

## 2021-12-04 ENCOUNTER — Telehealth: Payer: Self-pay | Admitting: *Deleted

## 2021-12-04 NOTE — Telephone Encounter (Signed)
Patient called and stated she missed her appointments because she was in the hospital.  She would like to get them rescheduled and she will need transportation scheduled as well.  Please give her a call back with the new appointments.

## 2021-12-04 NOTE — Telephone Encounter (Signed)
FYI When do we need to get pt in.?

## 2021-12-05 NOTE — Telephone Encounter (Signed)
Done appt mailed

## 2021-12-16 IMAGING — CR DG CHEST 2V
1 series · 2 of 2 positions shown · non-contrast
Comparison: None

CLINICAL DATA: Shortness of breath

EXAM:
CHEST - 2 VIEW

[Series 1: dg chest 2 view · 0.14mm/px · 2 of 2 slices shown]
[im 1/2]
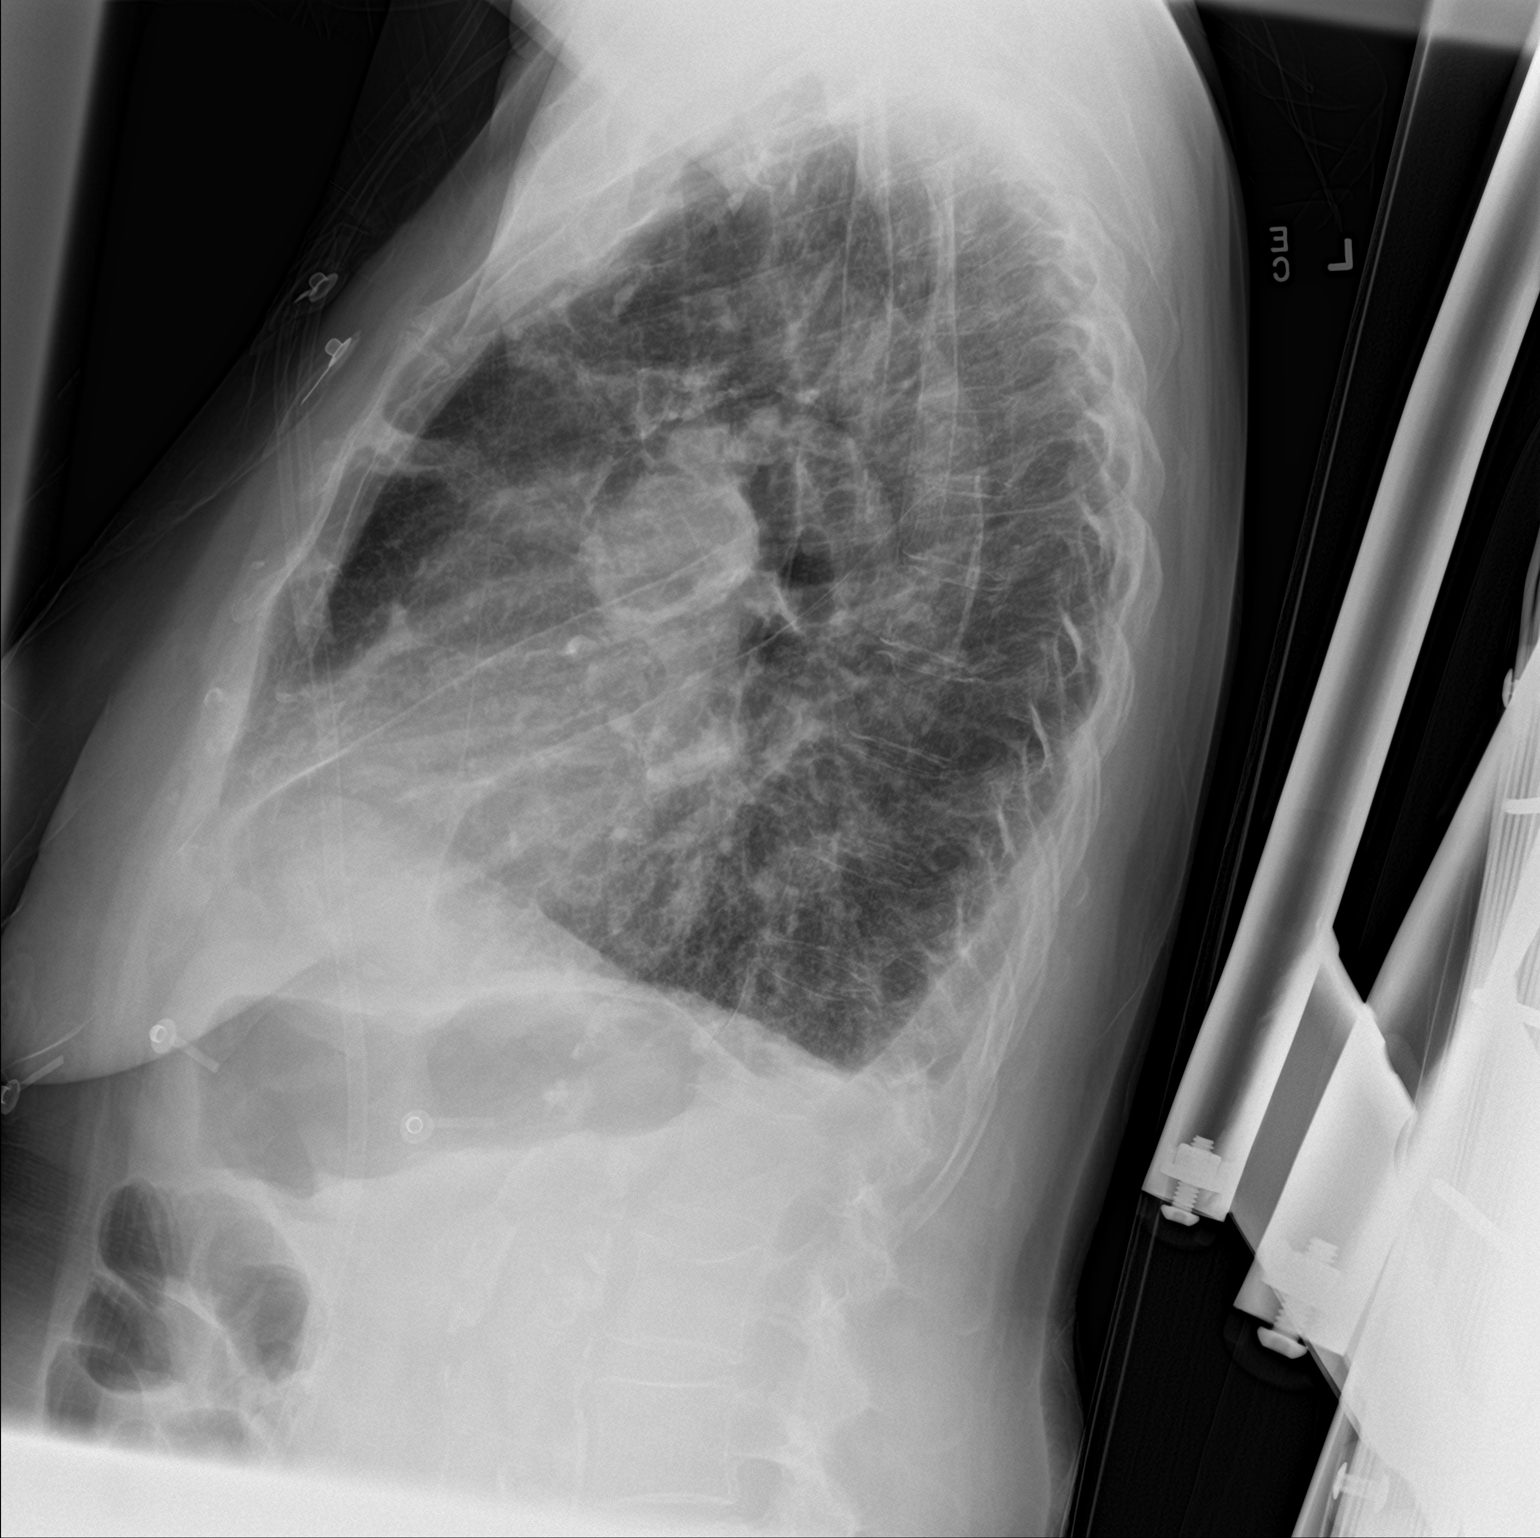
[im 2/2]
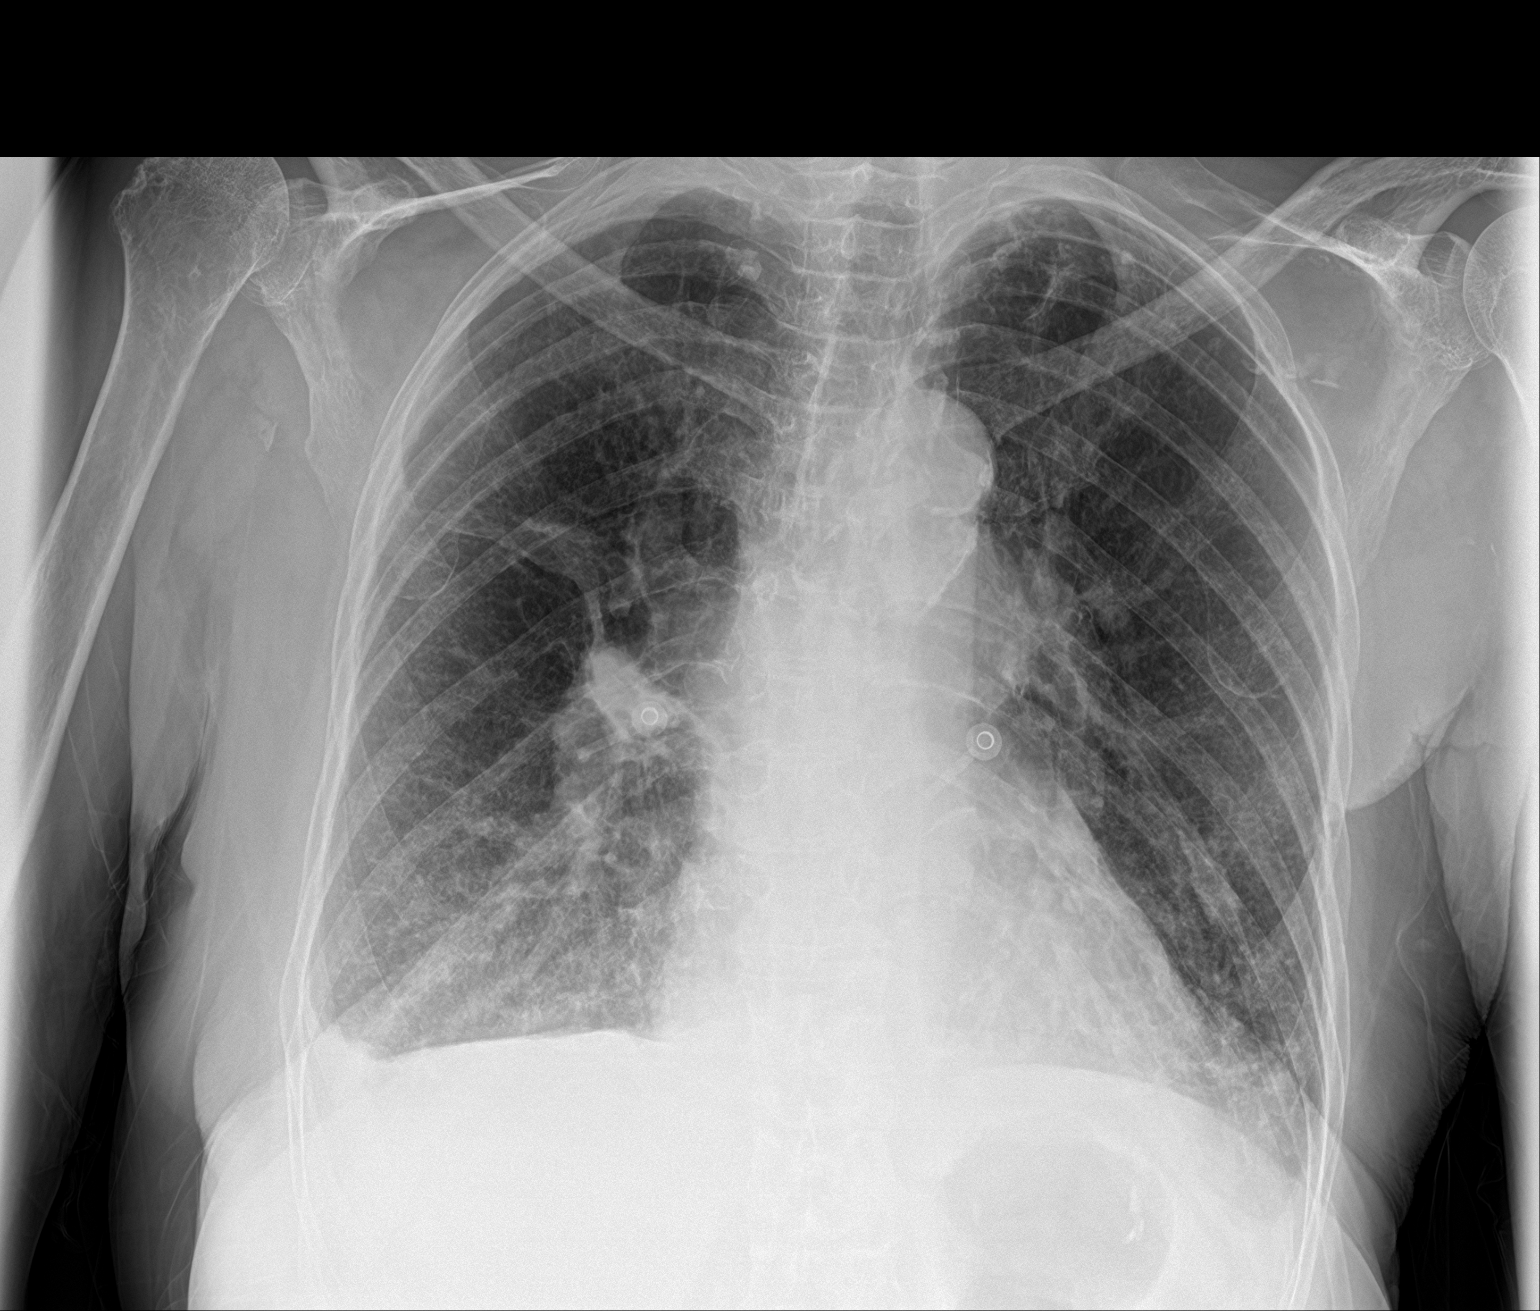

[2 of 2 positions shown; findings below may reference images not displayed]

FINDINGS: Mild bibasilar opacities with small right pleural effusion.
Unchanged right hilar prominence. No focal airspace consolidation.
IMPRESSION: Small right pleural effusion with basilar atelectasis.

## 2021-12-25 ENCOUNTER — Inpatient Hospital Stay: Payer: Medicare Other

## 2021-12-25 ENCOUNTER — Inpatient Hospital Stay: Payer: Medicare Other | Admitting: Internal Medicine

## 2021-12-25 ENCOUNTER — Other Ambulatory Visit: Payer: Medicare Other

## 2021-12-25 ENCOUNTER — Ambulatory Visit: Payer: Medicare Other

## 2021-12-25 ENCOUNTER — Ambulatory Visit: Payer: Medicare Other | Admitting: Internal Medicine

## 2021-12-28 ENCOUNTER — Encounter: Payer: Self-pay | Admitting: Internal Medicine

## 2021-12-28 ENCOUNTER — Inpatient Hospital Stay (HOSPITAL_BASED_OUTPATIENT_CLINIC_OR_DEPARTMENT_OTHER): Payer: Medicare Other | Admitting: Internal Medicine

## 2021-12-28 ENCOUNTER — Inpatient Hospital Stay: Payer: Medicare Other

## 2021-12-28 ENCOUNTER — Other Ambulatory Visit: Payer: Self-pay

## 2021-12-28 ENCOUNTER — Inpatient Hospital Stay: Payer: Medicare Other | Attending: Internal Medicine

## 2021-12-28 DIAGNOSIS — Z853 Personal history of malignant neoplasm of breast: Secondary | ICD-10-CM | POA: Diagnosis not present

## 2021-12-28 DIAGNOSIS — Z79899 Other long term (current) drug therapy: Secondary | ICD-10-CM | POA: Diagnosis not present

## 2021-12-28 DIAGNOSIS — I130 Hypertensive heart and chronic kidney disease with heart failure and stage 1 through stage 4 chronic kidney disease, or unspecified chronic kidney disease: Secondary | ICD-10-CM | POA: Diagnosis not present

## 2021-12-28 DIAGNOSIS — D631 Anemia in chronic kidney disease: Secondary | ICD-10-CM | POA: Insufficient documentation

## 2021-12-28 DIAGNOSIS — Z803 Family history of malignant neoplasm of breast: Secondary | ICD-10-CM | POA: Diagnosis not present

## 2021-12-28 DIAGNOSIS — N184 Chronic kidney disease, stage 4 (severe): Secondary | ICD-10-CM | POA: Insufficient documentation

## 2021-12-28 DIAGNOSIS — Z85118 Personal history of other malignant neoplasm of bronchus and lung: Secondary | ICD-10-CM | POA: Diagnosis not present

## 2021-12-28 DIAGNOSIS — I5032 Chronic diastolic (congestive) heart failure: Secondary | ICD-10-CM | POA: Diagnosis not present

## 2021-12-28 DIAGNOSIS — E785 Hyperlipidemia, unspecified: Secondary | ICD-10-CM | POA: Diagnosis not present

## 2021-12-28 DIAGNOSIS — I4891 Unspecified atrial fibrillation: Secondary | ICD-10-CM | POA: Diagnosis not present

## 2021-12-28 DIAGNOSIS — C155 Malignant neoplasm of lower third of esophagus: Secondary | ICD-10-CM

## 2021-12-28 DIAGNOSIS — J449 Chronic obstructive pulmonary disease, unspecified: Secondary | ICD-10-CM | POA: Diagnosis not present

## 2021-12-28 DIAGNOSIS — Z8501 Personal history of malignant neoplasm of esophagus: Secondary | ICD-10-CM | POA: Diagnosis not present

## 2021-12-28 DIAGNOSIS — Z9981 Dependence on supplemental oxygen: Secondary | ICD-10-CM | POA: Diagnosis not present

## 2021-12-28 DIAGNOSIS — K219 Gastro-esophageal reflux disease without esophagitis: Secondary | ICD-10-CM | POA: Diagnosis not present

## 2021-12-28 DIAGNOSIS — D472 Monoclonal gammopathy: Secondary | ICD-10-CM | POA: Diagnosis not present

## 2021-12-28 DIAGNOSIS — Z87891 Personal history of nicotine dependence: Secondary | ICD-10-CM | POA: Diagnosis not present

## 2021-12-28 DIAGNOSIS — Z923 Personal history of irradiation: Secondary | ICD-10-CM | POA: Insufficient documentation

## 2021-12-28 LAB — CBC WITH DIFFERENTIAL/PLATELET
Abs Immature Granulocytes: 0.02 10*3/uL (ref 0.00–0.07)
Basophils Absolute: 0.1 10*3/uL (ref 0.0–0.1)
Basophils Relative: 1 %
Eosinophils Absolute: 0.1 10*3/uL (ref 0.0–0.5)
Eosinophils Relative: 1 %
HCT: 34.7 % — ABNORMAL LOW (ref 36.0–46.0)
Hemoglobin: 11.5 g/dL — ABNORMAL LOW (ref 12.0–15.0)
Immature Granulocytes: 0 %
Lymphocytes Relative: 43 %
Lymphs Abs: 3.8 10*3/uL (ref 0.7–4.0)
MCH: 30.3 pg (ref 26.0–34.0)
MCHC: 33.1 g/dL (ref 30.0–36.0)
MCV: 91.3 fL (ref 80.0–100.0)
Monocytes Absolute: 0.5 10*3/uL (ref 0.1–1.0)
Monocytes Relative: 6 %
Neutro Abs: 4.3 10*3/uL (ref 1.7–7.7)
Neutrophils Relative %: 49 %
Platelets: 233 10*3/uL (ref 150–400)
RBC: 3.8 MIL/uL — ABNORMAL LOW (ref 3.87–5.11)
RDW: 18 % — ABNORMAL HIGH (ref 11.5–15.5)
WBC: 8.7 10*3/uL (ref 4.0–10.5)
nRBC: 0 % (ref 0.0–0.2)

## 2021-12-28 LAB — BASIC METABOLIC PANEL
Anion gap: 12 (ref 5–15)
BUN: 25 mg/dL — ABNORMAL HIGH (ref 8–23)
CO2: 20 mmol/L — ABNORMAL LOW (ref 22–32)
Calcium: 8.7 mg/dL — ABNORMAL LOW (ref 8.9–10.3)
Chloride: 106 mmol/L (ref 98–111)
Creatinine, Ser: 2.01 mg/dL — ABNORMAL HIGH (ref 0.44–1.00)
GFR, Estimated: 27 mL/min — ABNORMAL LOW (ref >60)
Glucose, Bld: 79 mg/dL (ref 70–99)
Potassium: 3.5 mmol/L (ref 3.5–5.1)
Sodium: 138 mmol/L (ref 135–145)

## 2021-12-28 NOTE — Progress Notes (Signed)
Pt states she can feel a heart "flutter", more noticeable x1week. ? ?Bowels are loose, since hospital release 11/29/21. ?

## 2021-12-28 NOTE — Progress Notes (Signed)
[place] ?OFFICE PROGRESS NOTE ? ?Patient Care Team: ?Center, [facility] as PCP - General (General Practice) ?[name], FNP as Nurse Practitioner (Family Medicine) ?[name], NP as Nurse Practitioner (Nurse Practitioner) ?[name], RN as Registered Nurse ?[name], RN as Registered Nurse ?[name], RN as Oncology Nurse Navigator ?[name], MD as Consulting Physician (Hematology and Oncology) ?Jonathon Bellows, MD as Consulting Physician (Gastroenterology) ? ? Cancer Staging  ?Breast cancer in female [facility]) ?Staging form: Breast, AJCC 7th Edition ?- Clinical: Stage IIIA (T3, N1, M0) - Signed by [name], NP on 02/21/2016 ?Estrogen receptor status: Positive ?Progesterone receptor status: Positive ? ? ? 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 ?## Right middle lung nodule- s/p Bronch- atypical cells [[name]]; last CT May 2017 ; SEP 29th PET- ~2cm; low SUV. A. LUNG MASS, RIGHT; CT-GUIDED BIOPSY: - ADENOCARCINOMA, ACINAR AND PAPILLARY MORPHOLOGIES. S/p RT [finished Dec 2017] s/p RT [DEC 2017]; AUG 2019- PET-right middle lobe radiation changes no evidence of recurrence. ? 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  T1b- submucosal involvement; N0- stage I [[name]; [facility]]; unable to proceed with Endo mucosal resection-because of extensive disease [Dr.Branch; [facility]]; recommend definitive radiation [finished  RT- 09/21/2019] ? ? ?#September 2020 worsening macrocytic anemia--bone marrow biopsy shows plasma cells up to 5%; unlikely to explain patient's anemia.  MDS is still a possibility-if anemia worsens would recommend peripheral blood NGS.  Jan 2021 -anemia likely secondary to  worsening CKD-Retacrit ? ?# 2000-  Left breast cancer [T3N1- stage III] mastecomy s/p RT; s/p chemo; Tamoxifen ? ?# NOV 111 2017- Mol testing [RML- adeno ca] ? ?# 2019-July-aug 2019-ischemic colitis status post right hemicolectomy [Dr.[name]]; acute renal failure;  ?-----------------------------------------------------------------   ? ?DIAGNOSIS: RML Lung ca adeno [stage I] #squamous cell carcinoma esophagus -stage I ? ? ?CURRENT/MOST RECENT THERAPY: Status post radiation ? ? ? ? ?  ?Breast cancer in female [facility])  ?07/24/1999 Initial Diagnosis  ? Breast cancer in female ([place]) T3 N1 M0 tumor ER/PR positive ?  ?08/24/1999 -  Anti-estrogen oral therapy  ? Started tamoxifen ?  ?08/24/1999 Surgery  ? Status post modified radical mastectomy of left breast ?  ? Chemotherapy  ?  ?  ? Radiation Therapy  ?  ?  ?11/24/2003 - 02/20/2009 Anti-estrogen oral therapy  ? Started Aromasin ?  ?Carcinoma of overlapping sites of left breast in female, estrogen receptor positive ([place])  ?06/22/2016 Initial Diagnosis  ? Cancer of overlapping sites of left female breast [facility]) ?  ?Malignant neoplasm of upper lobe of right lung ([place])  ?Malignant neoplasm of lower third of esophagus (H[facility])  ?05/01/2019 Initial Diagnosis  ? Malignant neoplasm of lower third of esophagus ([facility]) ?  ? ? ?INTERVAL HISTORY: Ambulating independently.  Alone.  ? ?Danielle [name] 66 y.o.  female pleasant patient above history of multiple malignancies including most recent stage I squamous cell cancer of esophagus currently status post radiation; and history of severe anemia-CKD stage IV/MGUS is here for follow-up. ? ?In the interim patient was admitted to the hospital-after her reversal of colostomy was complicated by C. difficile colitis/sepsis.  Treated conservatively. ? ?Denies any difficulty swallowing.  Denies any fevers or chills.  No nausea no vomiting.  No cough.  No swelling in legs.  Complains of intermittent palpitations.  Patient has a history of A-fib on  Eliquis. ? ? ?Review of Systems  ?Constitutional:  Positive for malaise/fatigue and weight loss. Negative for chills, diaphoresis and fever.  ?HENT:  Negative for nosebleeds and sore throat.   ?Eyes:  Negative for double vision.  ?Respiratory:  Positive for shortness of breath. Negative for hemoptysis, sputum production and wheezing.   ?Cardiovascular:  Negative for chest pain, palpitations, orthopnea and leg swelling.  ?Gastrointestinal:  Negative for abdominal pain, blood in stool, constipation, heartburn, melena and vomiting.  ?Genitourinary:  Negative for dysuria, frequency and urgency.  ?Musculoskeletal:  Negative for back pain and joint pain.  ?Skin: Negative.  Negative for itching and rash.  ?Neurological:  Negative for tingling, focal weakness, weakness and headaches.  ?Endo/Heme/Allergies:  Does not bruise/bleed easily.  ?Psychiatric/Behavioral:  Negative for depression. The patient is not nervous/anxious and does not have insomnia.   ?  ? ?PAST MEDICAL HISTORY :  ?Past Medical History:  ?Diagnosis Date  ?? Anemia   ?? Anxiety   ?? Aortic atherosclerosis ([place])   ?? Aortic valve stenosis 02/10/2018  ? a.) TTE 02/10/2018: EF 55-60%: mild AS with MPG of 12 mmHg. b.) TTE 04/21/2018: EF 35-40%; mild AS with MPG 8 mmHg. c.) TTE 11/07/2019: EF 50-55%; mild AS with MPG 10 mmHg.  ?? Arthritis   ?? Atrial fibrillation ([place])   ? a.) CHA2DS2-VASc = 4 (age, sex, HTN, aortic plaque). b.) Rate/rhythm maintained on oral carvedilol; chronically anticoagulated on dose reduced apixaban. c.)  Attempted deployment of LAA occlusive device on 01/11/2021; parameters failed and procedure aborted.  ?? Breast cancer, left ([place]) 2000  ? a.) T2N1M0; ER/PR (+) --> Tx'd with total mastectomy, LN resection, XRT, and chemotherapy  ?? Cancer of right lung ([place]) 07/30/2016  ? a.) adenocarcinoma; ALK, ROS1, PDL1, BRAF, EGFR all negative.  ?? Chronic diastolic CHF (congestive heart failure) ([place]) 11/26/2021  ?? CKD (chronic kidney disease),  stage IV ([place])   ?? COPD (chronic obstructive pulmonary disease) ([place])   ?? Dependence on supplemental oxygen   ?? Depression   ?? Diastolic dysfunction 27/03/2375  ? a.) TTE 02/10/2017: EF 55-60%; G2DD. b.) TTE 04/21/2018: EF 35-40%; mild LA dilation; mod MV regurgitation. c.) TTE 11/07/2019: EF 50-55%; G1DD.  ?? DOE (dyspnea on exertion)   ?? GERD (gastroesophageal reflux disease)   ?? Heart murmur   ?? History of 2019 novel coronavirus disease (COVID-19) 10/14/2019  ?? HLD (hyperlipidemia)   ?? Hypertension   ?? Long term current use of anticoagulant   ? a.) apixaban  ?? Lymphedema   ?? Personal history of chemotherapy   ?? Personal history of radiation therapy   ?? SBO (small bowel obstruction) ([place]) 11/05/2020  ?? Vitamin D deficiency   ? ? ?PAST SURGICAL HISTORY :   ?Past Surgical History:  ?Procedure Laterality Date  ?? Breast Biospy Left   ? [place]  ?? BREAST SURGERY    ?? COLONOSCOPY N/A 04/30/2018  ? Procedure: COLONOSCOPY;  Surgeon: Virgel Manifold, MD;  Location: [facility] ENDOSCOPY;  Service: Endoscopy;  Laterality: N/A;  ?? COLONOSCOPY N/A 07/22/2018  ? Procedure: COLONOSCOPY;  Surgeon: Virgel Manifold, MD;  Location: [facility] ENDOSCOPY;  Service: Endoscopy;  Laterality: N/A;  ?? COLONOSCOPY WITH PROPOFOL N/A 09/21/2021  ? Procedure: COLONOSCOPY WITH PROPOFOL;  Surgeon: Benjamine Sprague, DO;  Location: [facility] ENDOSCOPY;  Service: General;  Laterality: N/A;  ?? DILATION AND CURETTAGE OF UTERUS    ?? ELECTROMAGNETIC NAVIGATION  BROCHOSCOPY Right 04/11/2016  ? Procedure: ELECTROMAGNETIC NAVIGATION BRONCHOSCOPY;  Surgeon: Vilinda Boehringer, MD;  Location: [facility] ORS;  Service: Cardiopulmonary;  Laterality: Right;  ?? ESOPHAGOGASTRODUODENOSCOPY N/A 07/22/2018  ? Procedure: ESOPHAGOGASTRODUODENOSCOPY (EGD);  Surgeon: Virgel Manifold, MD;  Location: [facility] ENDOSCOPY;  Service: Endoscopy;  Laterality: N/A;  ?? ESOPHAGOGASTRODUODENOSCOPY (EGD) WITH PROPOFOL N/A 05/07/2018  ? Procedure: ESOPHAGOGASTRODUODENOSCOPY (EGD)  WITH PROPOFOL;  Surgeon: Lucilla Lame, MD;  Location: [facility] ENDOSCOPY;  Service: Endoscopy;  Laterality: N/A;  ?? ESOPHAGOGASTRODUODENOSCOPY (EGD) WITH PROPOFOL N/A 04/24/2019  ? Procedure: ESOPHAGOGASTRODUODENOSCOPY (EGD) [place]

## 2021-12-28 NOTE — Assessment & Plan Note (Addendum)
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  Currently s/p definitive radiation [dec 7th 2020].  Posttreatment PET scan February 2021-no evidence of any metastatic disease.  No uptake in the esophagus region. EGD April 28 2020 th- NO evidence of recurrence.Continue surveillance. Discussed with [name] re: repeating EGD. STABLE.  ? ?#Severe anemia-likely secondary to CKD stage IV-currently on Retacrit; hemoglobin today is 11.5-  HOLD  Retacrit today. STABLE.  ? ?#CKD stage III- IV-clinically STABLE.   [Dr.Lateef]  ? ?# A.fib on elquis- ?  Rate controlled.  Palpitation -defer to Dr.Kowalski/cards.  ? 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 Awaiting labs form today.   ? ?# DISPOSITION:  ?#  HOLD RETACRIT today ?# in 2 months- H&H- possible retacrit ?# follow up in 4 months-- cbc/bmp/ possible Retacrit;--Dr.B ?

## 2021-12-29 LAB — KAPPA/LAMBDA LIGHT CHAINS
Kappa free light chain: 48 mg/L — ABNORMAL HIGH (ref 3.3–19.4)
Kappa, lambda light chain ratio: 0.57 (ref 0.26–1.65)
Lambda free light chains: 84.9 mg/L — ABNORMAL HIGH (ref 5.7–26.3)

## 2022-01-01 LAB — MULTIPLE MYELOMA PANEL, SERUM
Albumin SerPl Elph-Mcnc: 3.6 g/dL (ref 2.9–4.4)
Albumin/Glob SerPl: 1 (ref 0.7–1.7)
Alpha 1: 0.3 g/dL (ref 0.0–0.4)
Alpha2 Glob SerPl Elph-Mcnc: 0.9 g/dL (ref 0.4–1.0)
B-Globulin SerPl Elph-Mcnc: 1 g/dL (ref 0.7–1.3)
Gamma Glob SerPl Elph-Mcnc: 1.5 g/dL (ref 0.4–1.8)
Globulin, Total: 3.7 g/dL (ref 2.2–3.9)
IgA: 216 mg/dL (ref 87–352)
IgG (Immunoglobin G), Serum: 1578 mg/dL (ref 586–1602)
IgM (Immunoglobulin M), Srm: 33 mg/dL (ref 26–217)
M Protein SerPl Elph-Mcnc: 0.8 g/dL — ABNORMAL HIGH
Total Protein ELP: 7.3 g/dL (ref 6.0–8.5)

## 2022-01-22 IMAGING — MG DIGITAL SCREENING UNILAT RIGHT W/ TOMO W/ CAD
4 series · 4 of 12 positions shown · non-contrast
Comparison: Previous exam(s).

CLINICAL DATA: Screening.

EXAM:
DIGITAL SCREENING UNILATERAL RIGHT MAMMOGRAM WITH CAD AND TOMO

[R CC synth-2D]
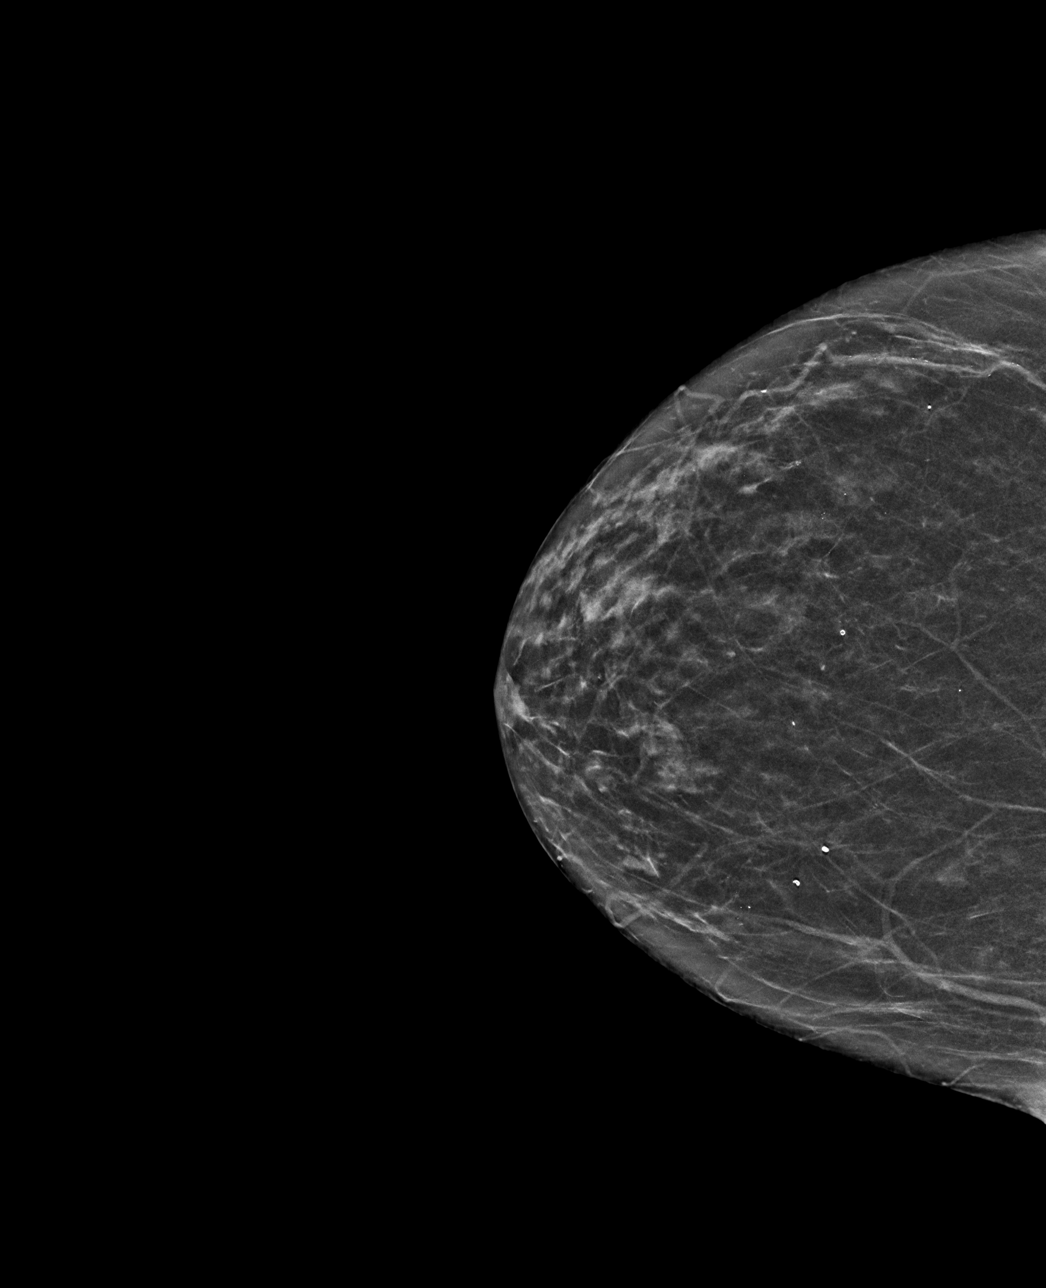

[R MLO synth-2D]
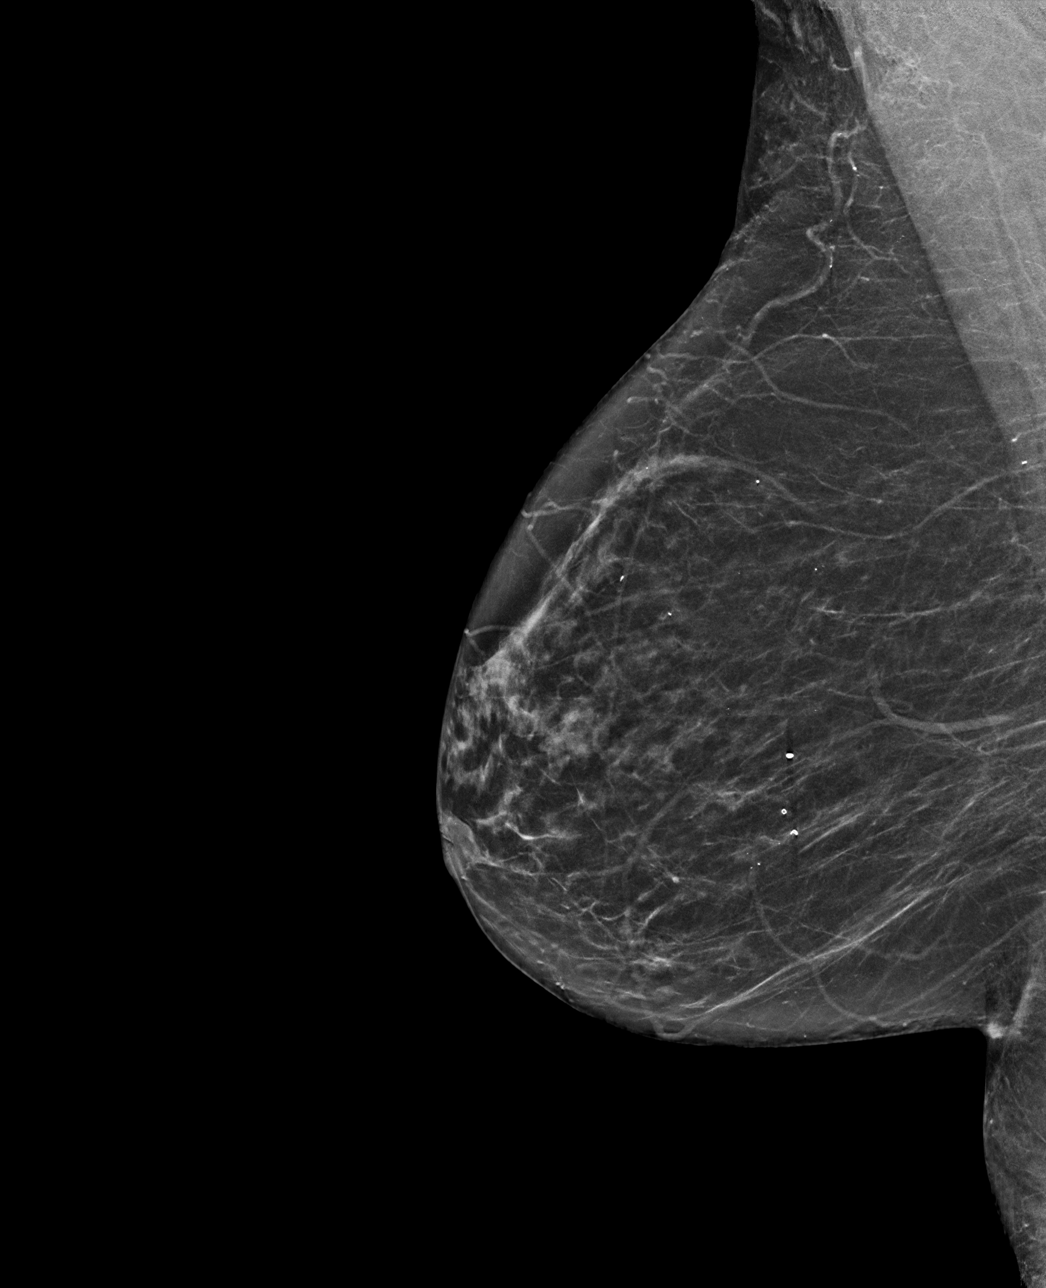

[R MLO tomo · tomo slice 31/62.0]
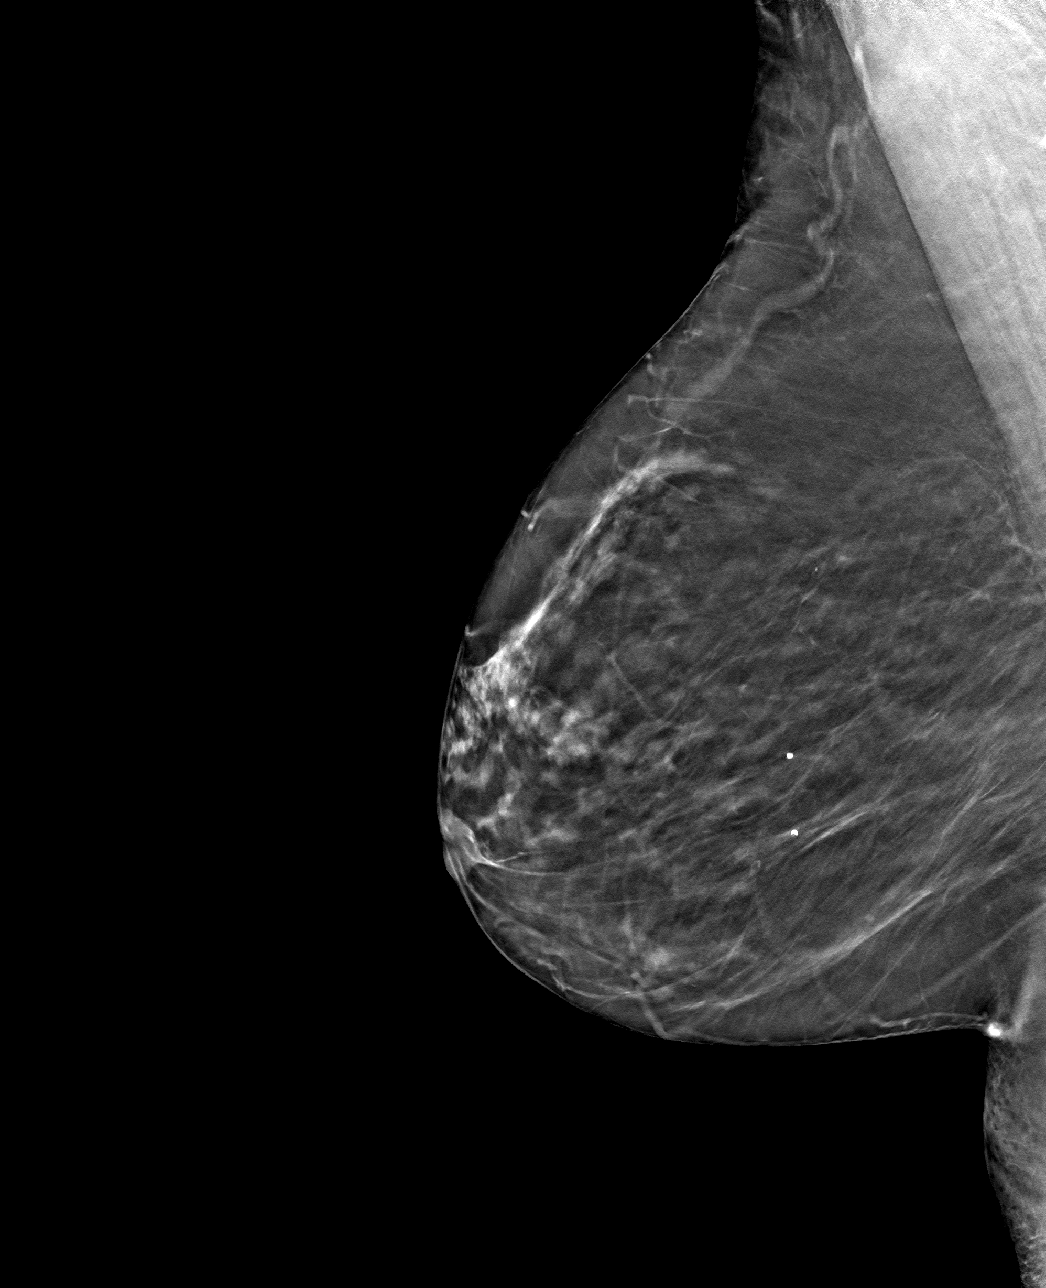

[R CC tomo · tomo slice 29/56.0]
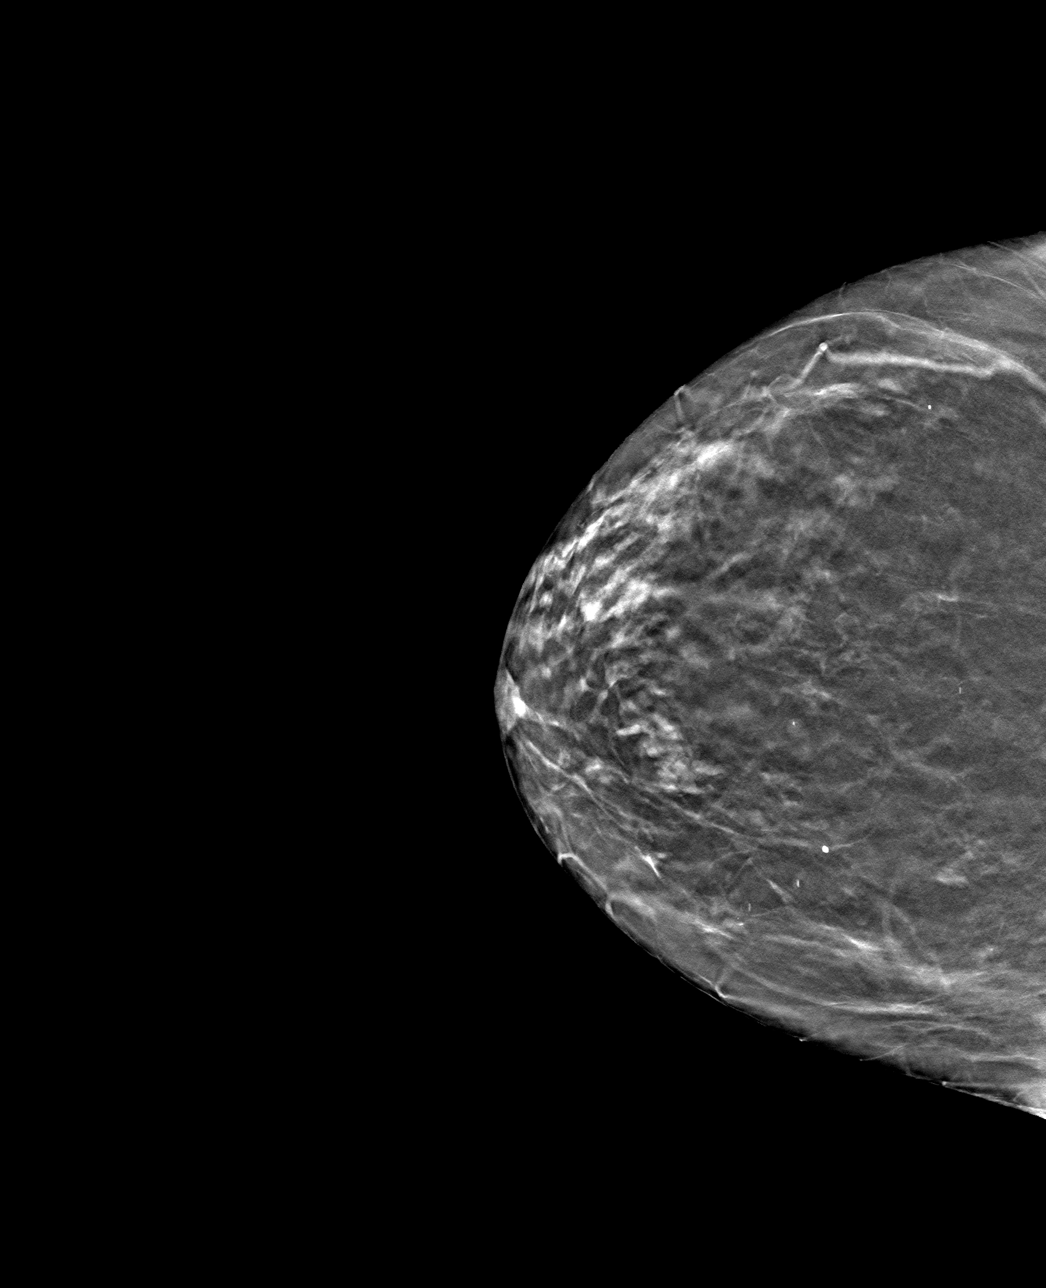

[4 of 12 positions shown; findings below may reference images not displayed]

ACR Breast Density Category b: There are scattered areas of
fibroglandular density.
FINDINGS: There are no findings suspicious for malignancy. Images were
processed with CAD.
IMPRESSION: No mammographic evidence of malignancy. A result letter of this
screening mammogram will be mailed directly to the patient.

RECOMMENDATION:
Screening mammogram in one year. (Code:3Y-2-NJC)

BI-RADS CATEGORY  1: Negative.

## 2022-01-26 ENCOUNTER — Emergency Department: Payer: Medicare Other

## 2022-01-26 ENCOUNTER — Inpatient Hospital Stay
Admission: EM | Admit: 2022-01-26 | Discharge: 2022-02-04 | DRG: 291 | Disposition: A | Discharge status: Home Health | Payer: Medicare Other | Attending: Internal Medicine | Admitting: Internal Medicine

## 2022-01-26 DIAGNOSIS — F40240 Claustrophobia: Secondary | ICD-10-CM | POA: Diagnosis present

## 2022-01-26 DIAGNOSIS — Z8501 Personal history of malignant neoplasm of esophagus: Secondary | ICD-10-CM

## 2022-01-26 DIAGNOSIS — Z9221 Personal history of antineoplastic chemotherapy: Secondary | ICD-10-CM

## 2022-01-26 DIAGNOSIS — E43 Unspecified severe protein-calorie malnutrition: Secondary | ICD-10-CM | POA: Diagnosis present

## 2022-01-26 DIAGNOSIS — Z681 Body mass index (BMI) 19 or less, adult: Secondary | ICD-10-CM | POA: Diagnosis not present

## 2022-01-26 DIAGNOSIS — R630 Anorexia: Secondary | ICD-10-CM | POA: Diagnosis present

## 2022-01-26 DIAGNOSIS — C50919 Malignant neoplasm of unspecified site of unspecified female breast: Secondary | ICD-10-CM | POA: Diagnosis present

## 2022-01-26 DIAGNOSIS — I4891 Unspecified atrial fibrillation: Secondary | ICD-10-CM | POA: Diagnosis not present

## 2022-01-26 DIAGNOSIS — I5043 Acute on chronic combined systolic (congestive) and diastolic (congestive) heart failure: Secondary | ICD-10-CM | POA: Diagnosis present

## 2022-01-26 DIAGNOSIS — I5022 Chronic systolic (congestive) heart failure: Secondary | ICD-10-CM | POA: Diagnosis present

## 2022-01-26 DIAGNOSIS — I739 Peripheral vascular disease, unspecified: Secondary | ICD-10-CM | POA: Diagnosis present

## 2022-01-26 DIAGNOSIS — I484 Atypical atrial flutter: Secondary | ICD-10-CM | POA: Diagnosis not present

## 2022-01-26 DIAGNOSIS — F32A Depression, unspecified: Secondary | ICD-10-CM | POA: Diagnosis present

## 2022-01-26 DIAGNOSIS — J9811 Atelectasis: Secondary | ICD-10-CM | POA: Diagnosis present

## 2022-01-26 DIAGNOSIS — J449 Chronic obstructive pulmonary disease, unspecified: Secondary | ICD-10-CM

## 2022-01-26 DIAGNOSIS — I482 Chronic atrial fibrillation, unspecified: Secondary | ICD-10-CM | POA: Diagnosis present

## 2022-01-26 DIAGNOSIS — I480 Paroxysmal atrial fibrillation: Secondary | ICD-10-CM

## 2022-01-26 DIAGNOSIS — Z515 Encounter for palliative care: Secondary | ICD-10-CM

## 2022-01-26 DIAGNOSIS — C155 Malignant neoplasm of lower third of esophagus: Secondary | ICD-10-CM | POA: Diagnosis present

## 2022-01-26 DIAGNOSIS — R471 Dysarthria and anarthria: Secondary | ICD-10-CM | POA: Diagnosis present

## 2022-01-26 DIAGNOSIS — F419 Anxiety disorder, unspecified: Secondary | ICD-10-CM | POA: Diagnosis present

## 2022-01-26 DIAGNOSIS — Z7901 Long term (current) use of anticoagulants: Secondary | ICD-10-CM

## 2022-01-26 DIAGNOSIS — I4892 Unspecified atrial flutter: Secondary | ICD-10-CM | POA: Diagnosis present

## 2022-01-26 DIAGNOSIS — R000 Tachycardia, unspecified: Secondary | ICD-10-CM | POA: Diagnosis present

## 2022-01-26 DIAGNOSIS — G459 Transient cerebral ischemic attack, unspecified: Secondary | ICD-10-CM | POA: Diagnosis not present

## 2022-01-26 DIAGNOSIS — C3411 Malignant neoplasm of upper lobe, right bronchus or lung: Secondary | ICD-10-CM | POA: Diagnosis present

## 2022-01-26 DIAGNOSIS — E785 Hyperlipidemia, unspecified: Secondary | ICD-10-CM | POA: Diagnosis present

## 2022-01-26 DIAGNOSIS — Z7189 Other specified counseling: Secondary | ICD-10-CM | POA: Diagnosis not present

## 2022-01-26 DIAGNOSIS — I10 Essential (primary) hypertension: Secondary | ICD-10-CM | POA: Diagnosis present

## 2022-01-26 DIAGNOSIS — I700 Atherosclerosis of aorta: Secondary | ICD-10-CM | POA: Diagnosis present

## 2022-01-26 DIAGNOSIS — Z8673 Personal history of transient ischemic attack (TIA), and cerebral infarction without residual deficits: Secondary | ICD-10-CM

## 2022-01-26 DIAGNOSIS — R29810 Facial weakness: Secondary | ICD-10-CM | POA: Diagnosis present

## 2022-01-26 DIAGNOSIS — H538 Other visual disturbances: Secondary | ICD-10-CM | POA: Diagnosis not present

## 2022-01-26 DIAGNOSIS — Z79899 Other long term (current) drug therapy: Secondary | ICD-10-CM

## 2022-01-26 DIAGNOSIS — R7989 Other specified abnormal findings of blood chemistry: Secondary | ICD-10-CM | POA: Diagnosis present

## 2022-01-26 DIAGNOSIS — R778 Other specified abnormalities of plasma proteins: Secondary | ICD-10-CM | POA: Diagnosis not present

## 2022-01-26 DIAGNOSIS — Z9981 Dependence on supplemental oxygen: Secondary | ICD-10-CM

## 2022-01-26 DIAGNOSIS — J90 Pleural effusion, not elsewhere classified: Secondary | ICD-10-CM | POA: Diagnosis present

## 2022-01-26 DIAGNOSIS — Z720 Tobacco use: Secondary | ICD-10-CM

## 2022-01-26 DIAGNOSIS — N184 Chronic kidney disease, stage 4 (severe): Secondary | ICD-10-CM | POA: Diagnosis present

## 2022-01-26 DIAGNOSIS — I959 Hypotension, unspecified: Secondary | ICD-10-CM | POA: Diagnosis present

## 2022-01-26 DIAGNOSIS — I248 Other forms of acute ischemic heart disease: Secondary | ICD-10-CM | POA: Diagnosis present

## 2022-01-26 DIAGNOSIS — I5023 Acute on chronic systolic (congestive) heart failure: Secondary | ICD-10-CM | POA: Diagnosis present

## 2022-01-26 DIAGNOSIS — Z803 Family history of malignant neoplasm of breast: Secondary | ICD-10-CM

## 2022-01-26 DIAGNOSIS — J9621 Acute and chronic respiratory failure with hypoxia: Secondary | ICD-10-CM | POA: Diagnosis present

## 2022-01-26 DIAGNOSIS — J029 Acute pharyngitis, unspecified: Secondary | ICD-10-CM | POA: Diagnosis not present

## 2022-01-26 DIAGNOSIS — Z853 Personal history of malignant neoplasm of breast: Secondary | ICD-10-CM

## 2022-01-26 DIAGNOSIS — Z8616 Personal history of COVID-19: Secondary | ICD-10-CM

## 2022-01-26 DIAGNOSIS — R040 Epistaxis: Secondary | ICD-10-CM | POA: Diagnosis present

## 2022-01-26 DIAGNOSIS — Z5320 Procedure and treatment not carried out because of patient's decision for unspecified reasons: Secondary | ICD-10-CM | POA: Diagnosis present

## 2022-01-26 DIAGNOSIS — Z20822 Contact with and (suspected) exposure to covid-19: Secondary | ICD-10-CM | POA: Diagnosis present

## 2022-01-26 DIAGNOSIS — R052 Subacute cough: Secondary | ICD-10-CM

## 2022-01-26 DIAGNOSIS — I130 Hypertensive heart and chronic kidney disease with heart failure and stage 1 through stage 4 chronic kidney disease, or unspecified chronic kidney disease: Principal | ICD-10-CM | POA: Diagnosis present

## 2022-01-26 DIAGNOSIS — Z9012 Acquired absence of left breast and nipple: Secondary | ICD-10-CM

## 2022-01-26 DIAGNOSIS — R079 Chest pain, unspecified: Secondary | ICD-10-CM | POA: Diagnosis present

## 2022-01-26 DIAGNOSIS — D509 Iron deficiency anemia, unspecified: Secondary | ICD-10-CM | POA: Diagnosis present

## 2022-01-26 DIAGNOSIS — D631 Anemia in chronic kidney disease: Secondary | ICD-10-CM | POA: Diagnosis present

## 2022-01-26 DIAGNOSIS — F329 Major depressive disorder, single episode, unspecified: Secondary | ICD-10-CM | POA: Diagnosis present

## 2022-01-26 DIAGNOSIS — K219 Gastro-esophageal reflux disease without esophagitis: Secondary | ICD-10-CM | POA: Diagnosis present

## 2022-01-26 DIAGNOSIS — Z923 Personal history of irradiation: Secondary | ICD-10-CM

## 2022-01-26 DIAGNOSIS — Z789 Other specified health status: Secondary | ICD-10-CM | POA: Diagnosis present

## 2022-01-26 DIAGNOSIS — K559 Vascular disorder of intestine, unspecified: Secondary | ICD-10-CM | POA: Diagnosis present

## 2022-01-26 DIAGNOSIS — T502X5A Adverse effect of carbonic-anhydrase inhibitors, benzothiadiazides and other diuretics, initial encounter: Secondary | ICD-10-CM | POA: Diagnosis present

## 2022-01-26 DIAGNOSIS — E559 Vitamin D deficiency, unspecified: Secondary | ICD-10-CM | POA: Diagnosis present

## 2022-01-26 DIAGNOSIS — Z85118 Personal history of other malignant neoplasm of bronchus and lung: Secondary | ICD-10-CM

## 2022-01-26 LAB — BASIC METABOLIC PANEL
Anion gap: 12 (ref 5–15)
BUN: 32 mg/dL — ABNORMAL HIGH (ref 8–23)
CO2: 24 mmol/L (ref 22–32)
Calcium: 9.4 mg/dL (ref 8.9–10.3)
Chloride: 104 mmol/L (ref 98–111)
Creatinine, Ser: 1.91 mg/dL — ABNORMAL HIGH (ref 0.44–1.00)
GFR, Estimated: 28 mL/min — ABNORMAL LOW (ref >60)
Glucose, Bld: 96 mg/dL (ref 70–99)
Potassium: 4.2 mmol/L (ref 3.5–5.1)
Sodium: 140 mmol/L (ref 135–145)

## 2022-01-26 LAB — CBC WITH DIFFERENTIAL/PLATELET
Abs Immature Granulocytes: 0.02 10*3/uL (ref 0.00–0.07)
Basophils Absolute: 0.1 10*3/uL (ref 0.0–0.1)
Basophils Relative: 1 %
Eosinophils Absolute: 0 10*3/uL (ref 0.0–0.5)
Eosinophils Relative: 0 %
HCT: 32.9 % — ABNORMAL LOW (ref 36.0–46.0)
Hemoglobin: 10.6 g/dL — ABNORMAL LOW (ref 12.0–15.0)
Immature Granulocytes: 0 %
Lymphocytes Relative: 29 %
Lymphs Abs: 1.9 10*3/uL (ref 0.7–4.0)
MCH: 29.9 pg (ref 26.0–34.0)
MCHC: 32.2 g/dL (ref 30.0–36.0)
MCV: 92.7 fL (ref 80.0–100.0)
Monocytes Absolute: 0.6 10*3/uL (ref 0.1–1.0)
Monocytes Relative: 9 %
Neutro Abs: 4.1 10*3/uL (ref 1.7–7.7)
Neutrophils Relative %: 61 %
Platelets: 227 10*3/uL (ref 150–400)
RBC: 3.55 MIL/uL — ABNORMAL LOW (ref 3.87–5.11)
RDW: 16.9 % — ABNORMAL HIGH (ref 11.5–15.5)
WBC: 6.8 10*3/uL (ref 4.0–10.5)
nRBC: 0.3 % — ABNORMAL HIGH (ref 0.0–0.2)

## 2022-01-26 LAB — BRAIN NATRIURETIC PEPTIDE: B Natriuretic Peptide: 1570.8 pg/mL — ABNORMAL HIGH (ref 0.0–100.0)

## 2022-01-26 LAB — GLUCOSE, CAPILLARY: Glucose-Capillary: 126 mg/dL — ABNORMAL HIGH (ref 70–99)

## 2022-01-26 LAB — RESP PANEL BY RT-PCR (FLU A&B, COVID) ARPGX2
Influenza A by PCR: NEGATIVE
Influenza B by PCR: NEGATIVE
SARS Coronavirus 2 by RT PCR: NEGATIVE

## 2022-01-26 LAB — TROPONIN I (HIGH SENSITIVITY)
Troponin I (High Sensitivity): 36 ng/L — ABNORMAL HIGH (ref <18)
Troponin I (High Sensitivity): 37 ng/L — ABNORMAL HIGH (ref <18)

## 2022-01-26 LAB — MRSA NEXT GEN BY PCR, NASAL: MRSA by PCR Next Gen: NOT DETECTED

## 2022-01-26 MED ORDER — ACETAMINOPHEN 650 MG RE SUPP: 650.0000 mg | Freq: Four times a day (QID) | RECTAL | Status: DC | PRN

## 2022-01-26 MED ORDER — ACETAMINOPHEN 325 MG PO TABS
650.0000 mg | ORAL_TABLET | Freq: Four times a day (QID) | ORAL | Status: DC | PRN
  Administered 2022-01-27 – 2022-02-03 (×15): 650 mg via ORAL
  Filled 2022-01-26 (×15): qty 2

## 2022-01-26 MED ORDER — ALBUTEROL SULFATE (2.5 MG/3ML) 0.083% IN NEBU: 3.0000 mL | INHALATION_SOLUTION | Freq: Four times a day (QID) | RESPIRATORY_TRACT | Status: DC | PRN

## 2022-01-26 MED ORDER — AMIODARONE HCL 200 MG PO TABS
200.0000 mg | ORAL_TABLET | Freq: Two times a day (BID) | ORAL | Status: DC
  Administered 2022-01-26 – 2022-02-04 (×17): 200 mg via ORAL
  Filled 2022-01-26 (×19): qty 1

## 2022-01-26 MED ORDER — METOPROLOL TARTRATE 5 MG/5ML IV SOLN
5.0000 mg | INTRAVENOUS | Status: AC | PRN
  Administered 2022-01-26 (×3): 5 mg via INTRAVENOUS
  Filled 2022-01-26: qty 5

## 2022-01-26 MED ORDER — POLYETHYLENE GLYCOL 3350 17 G PO PACK: 17.0000 g | PACK | Freq: Every day | ORAL | Status: DC | PRN

## 2022-01-26 MED ORDER — AMIODARONE HCL 200 MG PO TABS
200.0000 mg | ORAL_TABLET | Freq: Every day | ORAL | Status: DC
  Administered 2022-01-26: 200 mg via ORAL
  Filled 2022-01-26: qty 1

## 2022-01-26 MED ORDER — METOPROLOL TARTRATE 5 MG/5ML IV SOLN
2.5000 mg | INTRAVENOUS | Status: AC
  Administered 2022-01-26: 2.5 mg via INTRAVENOUS
  Filled 2022-01-26: qty 5

## 2022-01-26 MED ORDER — SODIUM CHLORIDE 0.9% FLUSH
3.0000 mL | Freq: Two times a day (BID) | INTRAVENOUS | Status: DC
  Administered 2022-01-26 – 2022-02-03 (×17): 3 mL via INTRAVENOUS

## 2022-01-26 MED ORDER — OXYCODONE HCL 5 MG PO TABS
5.0000 mg | ORAL_TABLET | ORAL | Status: DC | PRN
  Administered 2022-01-27 – 2022-01-28 (×4): 5 mg via ORAL
  Filled 2022-01-26 (×4): qty 1

## 2022-01-26 MED ORDER — FUROSEMIDE 10 MG/ML IJ SOLN
20.0000 mg | Freq: Once | INTRAMUSCULAR | Status: AC
  Administered 2022-01-26: 20 mg via INTRAVENOUS
  Filled 2022-01-26: qty 2

## 2022-01-26 MED ORDER — ZOLPIDEM TARTRATE 5 MG PO TABS
5.0000 mg | ORAL_TABLET | Freq: Every evening | ORAL | Status: DC | PRN
  Administered 2022-01-26 – 2022-02-02 (×6): 5 mg via ORAL
  Filled 2022-01-26 (×6): qty 1

## 2022-01-26 MED ORDER — ONDANSETRON HCL 4 MG PO TABS: 4.0000 mg | ORAL_TABLET | Freq: Four times a day (QID) | ORAL | Status: DC | PRN

## 2022-01-26 MED ORDER — TRAZODONE HCL 50 MG PO TABS
25.0000 mg | ORAL_TABLET | Freq: Every evening | ORAL | Status: DC | PRN
  Administered 2022-01-26: 25 mg via ORAL
  Filled 2022-01-26: qty 1

## 2022-01-26 MED ORDER — METOPROLOL TARTRATE 25 MG PO TABS
12.5000 mg | ORAL_TABLET | Freq: Two times a day (BID) | ORAL | Status: DC
Start: 2022-01-26 — End: 2022-01-28
  Administered 2022-01-26 – 2022-01-28 (×3): 12.5 mg via ORAL
  Filled 2022-01-26 (×4): qty 1

## 2022-01-26 MED ORDER — PANTOPRAZOLE SODIUM 40 MG PO TBEC
40.0000 mg | DELAYED_RELEASE_TABLET | Freq: Two times a day (BID) | ORAL | Status: DC
  Administered 2022-01-27 – 2022-02-04 (×17): 40 mg via ORAL
  Filled 2022-01-26 (×17): qty 1

## 2022-01-26 MED ORDER — CITALOPRAM HYDROBROMIDE 20 MG PO TABS
40.0000 mg | ORAL_TABLET | Freq: Every day | ORAL | Status: DC
  Administered 2022-01-27 – 2022-01-29 (×3): 40 mg via ORAL
  Filled 2022-01-26 (×3): qty 2

## 2022-01-26 MED ORDER — ONDANSETRON HCL 4 MG/2ML IJ SOLN: 4.0000 mg | Freq: Four times a day (QID) | INTRAMUSCULAR | Status: DC | PRN

## 2022-01-26 MED ORDER — CHLORHEXIDINE GLUCONATE CLOTH 2 % EX PADS: 6.0000 | MEDICATED_PAD | Freq: Every day | CUTANEOUS | Status: DC

## 2022-01-26 NOTE — ED Notes (Signed)
Informed RN bed assigned 

## 2022-01-26 NOTE — ED Triage Notes (Signed)
Pt at [facility], co of sob and cp started Monday , worse today copd history  ?

## 2022-01-26 NOTE — Assessment & Plan Note (Addendum)
No evidence of acute exacerbation at present, on 3 L oxygen ?

## 2022-01-26 NOTE — ED Provider Notes (Signed)
? ?[facility] ?Provider Note ? ? ? Event Date/Time  ? First MD Initiated Contact with Patient 01/26/22 1316   ?  (approximate) ? ? ?History  ? ?Chest Pain (At [facility], cp started Monday , worse today, copd, sob complaint as well ) ? ? ?HPI ? ?Danielle [name] is a 66 y.o. female   who on review of discharge summary from February 15 of this year has a history of COPD hypertension paroxysmal A-fib chronic kidney disease sepsis chronic diastolic heart failure and also review of notes demonstrate a history of right pleural effusion managed by Dr. Vanetta Mulders in past with thoracentesis ? ?Patient for about 1 week now has been experiencing mild congestion and slight but slowly increasing shortness of breath and a cough.  She typically does not use home oxygen but does have it available, and she reports she has been using it for the last week she has been feeling slightly short of breath. ? ?She has developed slowly increasing shortness of breath, and went and saw her doctor today and they noticed her heart rate is in the 140s.  She also reports she been feeling a slight feeling of chest pressure for the last several days since at least Monday and a feeling of rapid heart rate as well ? ?Referred here by her primary care ? ?Denies fever.  Some nasal congestion slight cough.  Denies chest pain but reports a slight feeling of a discomfort in her chest somewhat hard to describe mid to central in nature ? ? ? ?  ? ? ?Physical Exam  ? ?Triage Vital Signs: ?ED Triage Vitals  ?Enc Vitals Group  ?   BP 01/26/22 1300 (!) 121/100  ?   Pulse Rate 01/26/22 1300 (!) 138  ?   Resp 01/26/22 1300 18  ?   Temp 01/26/22 1300 98.8 ?F (37.1 ?C)  ?   Temp Source 01/26/22 1300 Oral  ?   SpO2 01/26/22 1300 99 %  ?   Weight --   ?   Height --   ?   Head Circumference --   ?   Peak Flow --   ?   Pain Score 01/26/22 1303 7  ?   Pain Loc --   ?   Pain Edu? --   ?   Excl. in [place]? --   ? ? ?Most recent vital signs: ?Vitals:  ?  01/26/22 1300 01/26/22 1459  ?BP: (!) 121/100 119/89  ?Pulse: (!) 138 (!) 137  ?Resp: 18 (!) 32  ?Temp: 98.8 ?F (37.1 ?C) 98.1 ?F (36.7 ?C)  ?SpO2: 99% 100%  ? ? ? ?General: Awake, no distress.  Very pleasant.  Oxygen saturation 99% on 2 L.  Without acute distress but chest slightly tachypneic ?CV:  Good peripheral perfusion.  Tachycardic, irregular.  No noted murmurs or rubs ?Resp:  Aside from slight tachypnea with respiratory rate about 20-24 and very mild accessory muscle use there is no noted acute abnormality.  She appears to be compensating well.  No wheezing.  Lung sounds are clear but notably diminished, slightly more so on the right than left.  No crackles or wheezing.  Speaks in phrases without difficulty ?Abd:  No distention.  Nontender throughout ?Other:  No noted edema ? ? ?ED Results / Procedures / Treatments  ? ?Labs ?(all labs ordered are listed, but only abnormal results are displayed) ?Labs Reviewed  ?CBC WITH DIFFERENTIAL/PLATELET - Abnormal; Notable for the following components:  ?  Result Value  ? RBC 3.55 (*)   ? Hemoglobin 10.6 (*)   ? HCT 32.9 (*)   ? RDW 16.9 (*)   ? nRBC 0.3 (*)   ? All other components within normal limits  ?BASIC METABOLIC PANEL - Abnormal; Notable for the following components:  ? BUN 32 (*)   ? Creatinine, Ser 1.91 (*)   ? GFR, Estimated 28 (*)   ? All other components within normal limits  ?TROPONIN I (HIGH SENSITIVITY) - Abnormal; Notable for the following components:  ? Troponin I (High Sensitivity) 36 (*)   ? All other components within normal limits  ?RESP PANEL BY RT-PCR (FLU A&B, COVID) ARPGX2  ?BRAIN NATRIURETIC PEPTIDE  ?TROPONIN I (HIGH SENSITIVITY)  ? ? ? ?EKG ? ?EKG is reviewed inter by me at 1330 ?Heart rate 140 ?QRS 80 ?QTc 520 ?Atrial fibrillation, rapid ventricular response heart rate approximately 140.  Mild nonspecific T wave abnormality.  No obvious acute ischemia possible old anteroseptal infarct ? ? ?RADIOLOGY ? ?Chest x-ray is reviewed by me,  notable right-sided pleural effusion.  Some pulmonary vascular congestion ? ?CT imaging of the chest without contrast is pending at time of signout will be followed up by Dr. Gardenia Phlegm ? ? ? ?PROCEDURES: ? ?Critical Care performed: Yes ? ?CRITICAL CARE ?Performed by: Delman Kitten ? ? ?Total critical care time: 30 minutes --- patient treated for atrial fibrillation with rapid ventricular response with 2 doses of IV antiarrhythmic Lopressor ? ?Critical care time was exclusive of separately billable procedures and treating other patients. ? ?Critical care was necessary to treat or prevent imminent or life-threatening deterioration. ? ?Critical care was time spent personally by me on the following activities: development of treatment plan with patient and/or surrogate as well as nursing, discussions with consultants, evaluation of patient's response to treatment, examination of patient, obtaining history from patient or surrogate, ordering and performing treatments and interventions, ordering and review of laboratory studies, ordering and review of radiographic studies, pulse oximetry and re-evaluation of patient's condition. ? ? ?Procedures ? ? ?MEDICATIONS ORDERED IN ED: ?Medications  ?amiodarone (PACERONE) tablet 200 mg (200 mg Oral Given 01/26/22 1358)  ?metoprolol tartrate (LOPRESSOR) injection 2.5 mg (2.5 mg Intravenous Given 01/26/22 1359)  ?metoprolol tartrate (LOPRESSOR) injection 2.5 mg (2.5 mg Intravenous Given 01/26/22 1508)  ? ? ? ?IMPRESSION / MDM / ASSESSMENT AND PLAN / ED COURSE  ?I reviewed the triage vital signs and the nursing notes. ?             ?               ? ?Differential diagnosis includes, but is not limited to, atrial fibrillation, history of paroxysmal, but likely spurred on by acute pulmonary etiology given patient's cough as well as mild dyspnea.  No pleuritic pain.  No clinical signs or symptoms of be suggestive of DVT and she is known to be anticoagulated.  She does however on chest x-ray  have pleural effusion on the right possibly secondary to CHF or possible transudate though cannot exclude possible exudative process or empyema.  Will order CT of the chest without contrast given patient's known reduced chronic GFR. ? ?Patient given Lopressor with effect heart rate improved to approximately 100-1 10 and remains normotensive but still RVR.  Have consulted and discussed the case with cardiology Dr. Corky Sox, and he advised possibly giving 1-2 doses of IV metoprolol as long with her oral amiodarone which she had not taken yet today ? ?  The patient is on the cardiac monitor to evaluate for evidence of arrhythmia and/or  ?significant heart rate changes. ? ? ?----------------------------------------- ?3:26 PM on 01/26/2022 ?----------------------------------------- ?Ongoing care assigned to Dr. Gardenia Phlegm.  Anticipate a likely need for admission, patient with A-fib RVR receiving oral amiodarone and IV Lopressor possibly secondary to acute pulmonary insult including recurrent right pleural effusion, CT imaging to further delineate pending. ?  ? ? ?FINAL CLINICAL IMPRESSION(S) / ED DIAGNOSES  ? ?Final diagnoses:  ?Atrial fibrillation with rapid ventricular response ([place])  ?Pleural effusion, right  ?Subacute cough  ? ? ? ?Rx / DC Orders  ? ?ED Discharge Orders   ? ? None  ? ?  ? ? ? ?Note:  This document was prepared using Dragon voice recognition software and may include unintentional dictation errors. ?  Delman Kitten, MD ?01/26/22 1527 ? ?

## 2022-01-26 NOTE — H&P (Signed)
?History and Physical  ? ? ?Patient: Danielle [name]:[id number] DOB: 1956/01/29 ?DOA: 01/26/2022 ?DOS: the patient was seen and examined on 01/26/2022 ?PCP: [facility]  ?Patient coming from: Home ? ?Chief Complaint:  ?Chief Complaint  ?Patient presents with  ? Chest Pain  ?  At [facility], cp started Monday , worse today, copd, sob complaint as well   ? ?HPI: Danielle [name] is a 66 y.o. female with medical history significant for COPD on 2 L home O2, CHF, CKD 4, remote history of breast cancer, history of lung cancer, esophageal cancer, MGUS, pression, hypertension, hyperlipidemia, A-fib on Eliquis, who presents with shortness of breath. ? ?Patient was recently admitted from February 12 to 15, during that admission presented with abdominal pain and diarrhea and was found to have C. difficile colitis.  Also admitted from January 9 to February 10, during that admission underwent robotic assisted laparoscopic ileostomy reversal.  Subsequently developed multiple complications including acute on chronic kidney failure, postoperative ileus, and recurrent lateral effusions.  Was treated for her pleural effusions with thoracentesis x2. ? ?Today patient reports that she has become progressively short of breath over the past week.  Since yesterday it has become significantly worse such that she feels like she cannot take a full or deep breath.  She denies any chest pain but does endorse palpitations.  Denies any other symptoms, denies missing any of her medications.  Reportedly went to see her PCP earlier today where she was noted to have heart rate in the 140s and sent to ED for further evaluation. ? ?In the ED vital signs notable for tachycardia ranging from 120s to 140s, intermittent tachypnea in the low 20s, and mild hypertension.  She also required slight increase in her baseline O2 to 4 L.  Lab work-up showed BMP with creatinine at baseline, CBC also at baseline with mild anemia.  Troponin was  mildly elevated at 36 which appears to be around her recent baseline, repeat 5 hours later was essentially unchanged at 37.  BNP elevated to 1570, has been similarly elevated in the past.  EKG showed A-fib with RVR.  Chest x-ray showed increase in size of right pleural effusion and some pulmonary vascular congestion.  CT chest without contrast obtained with showed reaccumulation of right-sided pleural effusion now moderate in size, as well as some findings that prompted suggestion to do head CT to rule out return tumor in the right upper lobe.  She was given amiodarone and metoprolol on the advice of cardiology when she first arrived with minimal effect on her heart rate. ? ?She was admitted for further management. ? ?  ?Review of Systems: As mentioned in the history of present illness. All other systems reviewed and are negative. ?Past Medical History:  ?Diagnosis Date  ? Anemia   ? Anxiety   ? Aortic atherosclerosis ([place])   ? Aortic valve stenosis 02/10/2018  ? a.) TTE 02/10/2018: EF 55-60%: mild AS with MPG of 12 mmHg. b.) TTE 04/21/2018: EF 35-40%; mild AS with MPG 8 mmHg. c.) TTE 11/07/2019: EF 50-55%; mild AS with MPG 10 mmHg.  ? Arthritis   ? Atrial fibrillation ([place])   ? a.) CHA2DS2-VASc = 4 (age, sex, HTN, aortic plaque). b.) Rate/rhythm maintained on oral carvedilol; chronically anticoagulated on dose reduced apixaban. c.)  Attempted deployment of LAA occlusive device on 01/11/2021; parameters failed and procedure aborted.  ? Breast cancer, left ([place]) 2000  ? a.) T2N1M0; ER/PR (+) --> Tx'd with total  mastectomy, LN resection, XRT, and chemotherapy  ? Cancer of right lung ([place]) 07/30/2016  ? a.) adenocarcinoma; ALK, ROS1, PDL1, BRAF, EGFR all negative.  ? Chronic diastolic CHF (congestive heart failure) ([place]) 11/26/2021  ? CKD (chronic kidney disease), stage IV ([place])   ? COPD (chronic obstructive pulmonary disease) ([place])   ? Dependence on supplemental oxygen   ? Depression   ? Diastolic dysfunction  46/65/9935  ? a.) TTE 02/10/2017: EF 55-60%; G2DD. b.) TTE 04/21/2018: EF 35-40%; mild LA dilation; mod MV regurgitation. c.) TTE 11/07/2019: EF 50-55%; G1DD.  ? DOE (dyspnea on exertion)   ? GERD (gastroesophageal reflux disease)   ? Heart murmur   ? History of 2019 novel coronavirus disease (COVID-19) 10/14/2019  ? HLD (hyperlipidemia)   ? Hypertension   ? Long term current use of anticoagulant   ? a.) apixaban  ? Lymphedema   ? Personal history of chemotherapy   ? Personal history of radiation therapy   ? SBO (small bowel obstruction) ([place]) 11/05/2020  ? Vitamin D deficiency   ? ?Past Surgical History:  ?Procedure Laterality Date  ? Breast Biospy Left   ? [place]  ? BREAST SURGERY    ? COLONOSCOPY N/A 04/30/2018  ? Procedure: COLONOSCOPY;  Surgeon: Virgel Manifold, MD;  Location: [facility] ENDOSCOPY;  Service: Endoscopy;  Laterality: N/A;  ? COLONOSCOPY N/A 07/22/2018  ? Procedure: COLONOSCOPY;  Surgeon: Virgel Manifold, MD;  Location: [facility] ENDOSCOPY;  Service: Endoscopy;  Laterality: N/A;  ? COLONOSCOPY WITH PROPOFOL N/A 09/21/2021  ? Procedure: COLONOSCOPY WITH PROPOFOL;  Surgeon: Benjamine Sprague, DO;  Location: [facility] ENDOSCOPY;  Service: General;  Laterality: N/A;  ? DILATION AND CURETTAGE OF UTERUS    ? ELECTROMAGNETIC NAVIGATION BROCHOSCOPY Right 04/11/2016  ? Procedure: ELECTROMAGNETIC NAVIGATION BRONCHOSCOPY;  Surgeon: Vilinda Boehringer, MD;  Location: [facility] ORS;  Service: Cardiopulmonary;  Laterality: Right;  ? ESOPHAGOGASTRODUODENOSCOPY N/A 07/22/2018  ? Procedure: ESOPHAGOGASTRODUODENOSCOPY (EGD);  Surgeon: Virgel Manifold, MD;  Location: [facility] ENDOSCOPY;  Service: Endoscopy;  Laterality: N/A;  ? ESOPHAGOGASTRODUODENOSCOPY (EGD) WITH PROPOFOL N/A 05/07/2018  ? Procedure: ESOPHAGOGASTRODUODENOSCOPY (EGD) WITH PROPOFOL;  Surgeon: Lucilla Lame, MD;  Location: [facility] ENDOSCOPY;  Service: Endoscopy;  Laterality: N/A;  ? ESOPHAGOGASTRODUODENOSCOPY (EGD) WITH PROPOFOL N/A 04/24/2019  ? Procedure:  ESOPHAGOGASTRODUODENOSCOPY (EGD) WITH PROPOFOL;  Surgeon: Jonathon Bellows, MD;  Location: [facility] ENDOSCOPY;  Service: Gastroenterology;  Laterality: N/A;  ? ESOPHAGOGASTRODUODENOSCOPY (EGD) WITH PROPOFOL N/A 01/12/2020  ? Procedure: ESOPHAGOGASTRODUODENOSCOPY (EGD) WITH PROPOFOL;  Surgeon: Jonathon Bellows, MD;  Location[facility] ENDOSCOPY;  Service: Gastroenterology;  Laterality: N/A;  ? ESOPHAGOGASTRODUODENOSCOPY (EGD) WITH PROPOFOL N/A 04/28/2020  ? Procedure: ESOPHAGOGASTRODUODENOSCOPY (EGD) WITH PROPOFOL;  Surgeon: Jonathon Bellows, MD;  Location: [facility] ENDOSCOPY;  Service: Gastroenterology;  Laterality: N/A;  ? EUS N/A 05/07/2019  ? Procedure: FULL UPPER ENDOSCOPIC ULTRASOUND (EUS) RADIAL;  Surgeon: Jola Schmidt, MD;  Location: [facility] ENDOSCOPY;  Service: Endoscopy;  Laterality: N/A;  ? ILEOSCOPY N/A 07/22/2018  ? Procedure: ILEOSCOPY THROUGH STOMA;  Surgeon: Virgel Manifold, MD;  Location: [facility] ENDOSCOPY;  Service: Endoscopy;  Laterality: N/A;  ? ILEOSTOMY    ? ILEOSTOMY N/A 09/08/2018  ? Procedure: ILEOSTOMY REVISION POSSIBLE CREATION;  Surgeon: Herbert Pun, MD;  Location: [facility] ORS;  Service: General;  Laterality: N/A;  ? ILEOSTOMY CLOSURE N/A 08/15/2018  ? Procedure: DILATION OF ILEOSTOMY STRICTURE;  Surgeon: Herbert Pun, MD;  Location: [facility] ORS;  Service: General;  Laterality: N/A;  ? LAPAROTOMY Right 05/04/2018  ? Procedure: EXPLORATORY LAPAROTOMY right colectomy right and left ostomy;  Surgeon:  Herbert Pun, MD;  Location: [facility] ORS;  Service: General;  Laterality: Right;  ? LEFT ATRIAL APPENDAGE OCCLUSION N/A 01/11/2021  ? Procedure: LEFT ATRIAL APPENDAGE OCCLUSION ([place]); ABORTED PROCEDURE WITHOUT DEVICE BEING IMPLANTED; Location: [facility]; Surgeon: Mylinda Latina, MD  ? LUNG BIOPSY    ? MASTECTOMY Left   ? 2000, [facility]  ? ROTATOR CUFF REPAIR Right   ? [place]  ? XI ROBOTIC ASSISTED COLOSTOMY TAKEDOWN N/A 10/23/2021  ? Procedure: XI ROBOTIC ASSISTED ILEOSTOMY TAKEDOWN;  Surgeon:  Herbert Pun, MD;  Location: [facility] ORS;  Service: General;  Laterality: N/A;  180 minutes for the surgery part please  ? ?Social History:  reports that she quit smoking about 9 years ago. Her smoking use included cigarettes. She has

## 2022-01-26 NOTE — Assessment & Plan Note (Addendum)
Depression-continue citalopram ?GERD-continue PPI ?Hypertension-hold midodrine ?

## 2022-01-26 NOTE — Assessment & Plan Note (Addendum)
US guided thoracentesis tomorrow. Patient last took Eliquis on Thursday per her.  May benefit from Pleurx to help avoid further episodes of reaccumulation. ?-PCCM following ?

## 2022-01-26 NOTE — Assessment & Plan Note (Addendum)
Cardiology consult.  Hold Eliquis pending upcoming thoracentesis. ?Continue amiodarone, metoprolol and starting Cardizem drip for rate control ?

## 2022-01-26 NOTE — Assessment & Plan Note (Addendum)
Due to demand ischemia.  Cardiology consult ?

## 2022-01-27 DIAGNOSIS — N184 Chronic kidney disease, stage 4 (severe): Secondary | ICD-10-CM

## 2022-01-27 DIAGNOSIS — R040 Epistaxis: Secondary | ICD-10-CM

## 2022-01-27 DIAGNOSIS — I484 Atypical atrial flutter: Secondary | ICD-10-CM

## 2022-01-27 DIAGNOSIS — I5022 Chronic systolic (congestive) heart failure: Secondary | ICD-10-CM

## 2022-01-27 DIAGNOSIS — J90 Pleural effusion, not elsewhere classified: Secondary | ICD-10-CM

## 2022-01-27 LAB — BASIC METABOLIC PANEL
Anion gap: 10 (ref 5–15)
BUN: 37 mg/dL — ABNORMAL HIGH (ref 8–23)
CO2: 23 mmol/L (ref 22–32)
Calcium: 8.8 mg/dL — ABNORMAL LOW (ref 8.9–10.3)
Chloride: 105 mmol/L (ref 98–111)
Creatinine, Ser: 2.11 mg/dL — ABNORMAL HIGH (ref 0.44–1.00)
GFR, Estimated: 25 mL/min — ABNORMAL LOW (ref >60)
Glucose, Bld: 81 mg/dL (ref 70–99)
Potassium: 4.3 mmol/L (ref 3.5–5.1)
Sodium: 138 mmol/L (ref 135–145)

## 2022-01-27 LAB — CBC
HCT: 31.5 % — ABNORMAL LOW (ref 36.0–46.0)
Hemoglobin: 10.4 g/dL — ABNORMAL LOW (ref 12.0–15.0)
MCH: 30.7 pg (ref 26.0–34.0)
MCHC: 33 g/dL (ref 30.0–36.0)
MCV: 92.9 fL (ref 80.0–100.0)
Platelets: 201 10*3/uL (ref 150–400)
RBC: 3.39 MIL/uL — ABNORMAL LOW (ref 3.87–5.11)
RDW: 16.9 % — ABNORMAL HIGH (ref 11.5–15.5)
WBC: 5.5 10*3/uL (ref 4.0–10.5)
nRBC: 0.4 % — ABNORMAL HIGH (ref 0.0–0.2)

## 2022-01-27 LAB — PROTIME-INR
INR: 1.3 — ABNORMAL HIGH (ref 0.8–1.2)
Prothrombin Time: 15.6 seconds — ABNORMAL HIGH (ref 11.4–15.2)

## 2022-01-27 LAB — APTT: aPTT: 40 seconds — ABNORMAL HIGH (ref 24–36)

## 2022-01-27 MED ORDER — DILTIAZEM HCL-DEXTROSE 125-5 MG/125ML-% IV SOLN (PREMIX)
5.0000 mg/h | INTRAVENOUS | Status: DC
  Administered 2022-01-27: 5 mg/h via INTRAVENOUS
  Filled 2022-01-27: qty 125

## 2022-01-27 MED ORDER — CHLORHEXIDINE GLUCONATE CLOTH 2 % EX PADS
6.0000 | MEDICATED_PAD | Freq: Every day | CUTANEOUS | Status: DC
  Administered 2022-01-28 – 2022-02-03 (×7): 6 via TOPICAL

## 2022-01-27 MED ORDER — GUAIFENESIN-DM 100-10 MG/5ML PO SYRP
5.0000 mL | ORAL_SOLUTION | ORAL | Status: DC | PRN
  Administered 2022-01-27 – 2022-01-30 (×2): 5 mL via ORAL
  Filled 2022-01-27: qty 5
  Filled 2022-01-27: qty 10

## 2022-01-27 MED ORDER — PHENOL 1.4 % MT LIQD
1.0000 | OROMUCOSAL | Status: DC | PRN
  Administered 2022-01-27 – 2022-02-01 (×3): 1 via OROMUCOSAL
  Filled 2022-01-27 (×2): qty 177

## 2022-01-27 MED ORDER — FUROSEMIDE 10 MG/ML IJ SOLN
40.0000 mg | Freq: Two times a day (BID) | INTRAMUSCULAR | Status: DC
Start: 2022-01-28 — End: 2022-01-28
  Administered 2022-01-28: 40 mg via INTRAVENOUS

## 2022-01-27 MED ORDER — OXYMETAZOLINE HCL 0.05 % NA SOLN
1.0000 | Freq: Two times a day (BID) | NASAL | Status: AC | PRN
  Administered 2022-01-27: 1 via NASAL
  Filled 2022-01-27: qty 15

## 2022-01-27 MED ORDER — FUROSEMIDE 10 MG/ML IJ SOLN
40.0000 mg | INTRAMUSCULAR | Status: AC
  Administered 2022-01-27: 40 mg via INTRAVENOUS
  Filled 2022-01-27: qty 4

## 2022-01-27 MED ORDER — FUROSEMIDE 10 MG/ML IJ SOLN
40.0000 mg | Freq: Every day | INTRAMUSCULAR | Status: DC
  Administered 2022-01-27: 40 mg via INTRAVENOUS
  Filled 2022-01-27 (×2): qty 4

## 2022-01-27 NOTE — Assessment & Plan Note (Signed)
Some worsening due to diuresis.  She will be on 40 mg IV twice daily of Lasix starting tomorrow.  We will consult nephrology as she is not making much urine ?Her kidney function might worsen with diuresis ?

## 2022-01-27 NOTE — Consult Note (Signed)
Danielle [name], Danielle [name] ?[id number] ?[birth year] ?Max Sane, MD ? ?Reason for Consult: Epistaxis ? ?HPI: Patient is a 66 year old African-American female who has had COPD and been on some home oxygen for a while.  She has recent A-fib starting in February and has been on blood thinners for the past couple months.  She was admitted the hospital yesterday and starting this morning she had nosebleed coming from her right nostril.  She has been on O2 by nasal cannula at home as well as here in the hospital.  Her bleeding has been on and off from the right nostril only.  She did not tolerate pinching of her nose as her O2 sats would drop.  She has now been switched from nasal cannula is to a facemask with a rebreather and high humidity.  It seems as though her bleeding has temporarily stopped.  Patient is alert and a good historian. ? ?Allergies: No Known Allergies ? ?ROS: Review of systems normal other than 12 systems except per HPI. ? ?PMH:  ?Past Medical History:  ?Diagnosis Date  ? Anemia   ? Anxiety   ? Aortic atherosclerosis ([place])   ? Aortic valve stenosis 02/10/2018  ? a.) TTE 02/10/2018: EF 55-60%: mild AS with MPG of 12 mmHg. b.) TTE 04/21/2018: EF 35-40%; mild AS with MPG 8 mmHg. c.) TTE 11/07/2019: EF 50-55%; mild AS with MPG 10 mmHg.  ? Arthritis   ? Atrial fibrillation ([place])   ? a.) CHA2DS2-VASc = 4 (age, sex, HTN, aortic plaque). b.) Rate/rhythm maintained on oral carvedilol; chronically anticoagulated on dose reduced apixaban. c.)  Attempted deployment of LAA occlusive device on 01/11/2021; parameters failed and procedure aborted.  ? Breast cancer, left ([place]) 2000  ? a.) T2N1M0; ER/PR (+) --> Tx'd with total mastectomy, LN resection, XRT, and chemotherapy  ? Cancer of right lung ([place]) 07/30/2016  ? a.) adenocarcinoma; ALK, ROS1, PDL1, BRAF, EGFR all negative.  ? Chronic diastolic CHF (congestive heart failure) ([place]) 11/26/2021  ? CKD (chronic kidney disease), stage IV ([place])   ? COPD (chronic obstructive pulmonary  disease) ([place])   ? Dependence on supplemental oxygen   ? Depression   ? Diastolic dysfunction 11/65/7903  ? a.) TTE 02/10/2017: EF 55-60%; G2DD. b.) TTE 04/21/2018: EF 35-40%; mild LA dilation; mod MV regurgitation. c.) TTE 11/07/2019: EF 50-55%; G1DD.  ? DOE (dyspnea on exertion)   ? GERD (gastroesophageal reflux disease)   ? Heart murmur   ? History of 2019 novel coronavirus disease (COVID-19) 10/14/2019  ? HLD (hyperlipidemia)   ? Hypertension   ? Long term current use of anticoagulant   ? a.) apixaban  ? Lymphedema   ? Personal history of chemotherapy   ? Personal history of radiation therapy   ? SBO (small bowel obstruction) ([place]) 11/05/2020  ? Vitamin D deficiency   ? ? ?FH:  ?Family History  ?Problem Relation Age of Onset  ? Breast cancer Mother 31  ? Cancer Mother   ?     Breast   ? Cirrhosis Father   ? Breast cancer Paternal Aunt 84  ? Cancer Maternal Aunt   ?     Breast   ? ? ?SH:  ?Social History  ? ?Socioeconomic History  ? Marital status: Divorced  ?  Spouse name: Not on file  ? Number of children: 3  ? Years of education: Not on file  ? Highest education level: Not on file  ?Occupational History  ? Occupation: Retired   ?Tobacco Use  ? Smoking  status: Former  ?  Packs/day: 0.50  ?  Years: 20.00  ?  Pack years: 10.00  ?  Types: Cigarettes  ?  Quit date: 07/02/2012  ?  Years since quitting: 9.5  ? Smokeless tobacco: Current  ?  Types: Snuff  ? Tobacco comments:  ?  quit 2014  ?Vaping Use  ? Vaping Use: Never used  ?Substance and Sexual Activity  ? Alcohol use: Yes  ?  Comment: Occasionally beer  ? Drug use: No  ? Sexual activity: Not Currently  ?Other Topics Concern  ? Not on file  ?Social History Narrative  ? Not on file  ? ?Social Determinants of Health  ? ?Financial Resource Strain: Not on file  ?Food Insecurity: Not on file  ?Transportation Needs: Not on file  ?Physical Activity: Not on file  ?Stress: Not on file  ?Social Connections: Not on file  ?Intimate Partner Violence: Not on file  ? ? ?PSH:   ?Past Surgical History:  ?Procedure Laterality Date  ? Breast Biospy Left   ? [place]  ? BREAST SURGERY    ? COLONOSCOPY N/A 04/30/2018  ? Procedure: COLONOSCOPY;  Surgeon: Virgel Manifold, MD;  Location: [facility] ENDOSCOPY;  Service: Endoscopy;  Laterality: N/A;  ? COLONOSCOPY N/A 07/22/2018  ? Procedure: COLONOSCOPY;  Surgeon: Virgel Manifold, MD;  Location: [facility] ENDOSCOPY;  Service: Endoscopy;  Laterality: N/A;  ? COLONOSCOPY WITH PROPOFOL N/A 09/21/2021  ? Procedure: COLONOSCOPY WITH PROPOFOL;  Surgeon: Benjamine Sprague, DO;  Location: [facility] ENDOSCOPY;  Service: General;  Laterality: N/A;  ? DILATION AND CURETTAGE OF UTERUS    ? ELECTROMAGNETIC NAVIGATION BROCHOSCOPY Right 04/11/2016  ? Procedure: ELECTROMAGNETIC NAVIGATION BRONCHOSCOPY;  Surgeon: Vilinda Boehringer, MD;  Location: [facility] ORS;  Service: Cardiopulmonary;  Laterality: Right;  ? ESOPHAGOGASTRODUODENOSCOPY N/A 07/22/2018  ? Procedure: ESOPHAGOGASTRODUODENOSCOPY (EGD);  Surgeon: Virgel Manifold, MD;  Location: [facility] ENDOSCOPY;  Service: Endoscopy;  Laterality: N/A;  ? ESOPHAGOGASTRODUODENOSCOPY (EGD) WITH PROPOFOL N/A 05/07/2018  ? Procedure: ESOPHAGOGASTRODUODENOSCOPY (EGD) WITH PROPOFOL;  Surgeon: Lucilla Lame, MD;  Location: [facility] ENDOSCOPY;  Service: Endoscopy;  Laterality: N/A;  ? ESOPHAGOGASTRODUODENOSCOPY (EGD) WITH PROPOFOL N/A 04/24/2019  ? Procedure: ESOPHAGOGASTRODUODENOSCOPY (EGD) WITH PROPOFOL;  Surgeon: Jonathon Bellows, MD;  Location: [facility] ENDOSCOPY;  Service: Gastroenterology;  Laterality: N/A;  ? ESOPHAGOGASTRODUODENOSCOPY (EGD) WITH PROPOFOL N/A 01/12/2020  ? Procedure: ESOPHAGOGASTRODUODENOSCOPY (EGD) WITH PROPOFOL;  Surgeon: Jonathon Bellows, MD;  Location: [facility] ENDOSCOPY;  Service: Gastroenterology;  Laterality: N/A;  ? ESOPHAGOGASTRODUODENOSCOPY (EGD) WITH PROPOFOL N/A 04/28/2020  ? Procedure: ESOPHAGOGASTRODUODENOSCOPY (EGD) WITH PROPOFOL;  Surgeon: Jonathon Bellows, MD;  Location: [facility] ENDOSCOPY;  Service: Gastroenterology;  Laterality: N/A;  ?  EUS N/A 05/07/2019  ? Procedure: FULL UPPER ENDOSCOPIC ULTRASOUND (EUS) RADIAL;  Surgeon: Jola Schmidt, MD;  Location: [facility] ENDOSCOPY;  Service: Endoscopy;  Laterality: N/A;  ? ILEOSCOPY N/A 07/22/2018  ? Procedure: ILEOSCOPY THROUGH STOMA;  Surgeon: Virgel Manifold, MD;  Location: [facility] ENDOSCOPY;  Service: Endoscopy;  Laterality: N/A;  ? ILEOSTOMY    ? ILEOSTOMY N/A 09/08/2018  ? Procedure: ILEOSTOMY REVISION POSSIBLE CREATION;  Surgeon: Herbert Pun, MD;  Location: [facility] ORS;  Service: General;  Laterality: N/A;  ? ILEOSTOMY CLOSURE N/A 08/15/2018  ? Procedure: DILATION OF ILEOSTOMY STRICTURE;  Surgeon: Herbert Pun, MD;  Location: [facility] ORS;  Service: General;  Laterality: N/A;  ? LAPAROTOMY Right 05/04/2018  ? Procedure: EXPLORATORY LAPAROTOMY right colectomy right and left ostomy;  Surgeon: Herbert Pun, MD;  Location: [facility] ORS;  Service: General;  Laterality: Right;  ? LEFT ATRIAL  APPENDAGE OCCLUSION N/A 01/11/2021  ? Procedure: LEFT ATRIAL APPENDAGE OCCLUSION ([place]); ABORTED PROCEDURE WITHOUT DEVICE BEING IMPLANTED; Location: [facility]; Surgeon: Mylinda Latina, MD  ? LUNG BIOPSY    ? MASTECTOMY Left   ? 2000, [facility]  ? ROTATOR CUFF REPAIR Right   ? [place]  ? XI ROBOTIC ASSISTED COLOSTOMY TAKEDOWN N/A 10/23/2021  ? Procedure: XI ROBOTIC ASSISTED ILEOSTOMY TAKEDOWN;  Surgeon: Herbert Pun, MD;  Location: [facility] ORS;  Service: General;  Laterality: N/A;  180 minutes for the surgery part please  ? ? ?Physical  Exam: Patient is awake and alert and very cooperative.  Her nose shows slight deviation of her septum to the left side.  She had some blood staining inside her right nostril but there are no clots but I can see sticking out from the septum and there is no obstruction to her nasal airway at this time.  Her left side is reasonably open and has no bleeding.  Her oropharynx is clear with some blood staining in the back of her throat but no active bleeding coming  down. ? ? ?A/P: The patient has been on oxygen by nasal cannula and blood thinners.  The bleeding typically comes from the anterior nasal septum that dries out.  Currently the bleeding has stopped.  I discussed with her

## 2022-01-27 NOTE — Plan of Care (Signed)
Patient with AF/flutter with RVR today with rate 120-135 this am; she has been hemodynamically stable, but pt very dyspneic with exertion. Lungs with crackles but also diminished. Diltiazem drip started, currently at 10 mg/hr. Rate improved 65-110. Lasix 40 mg given x 2 today, 250 cc uop so far today. Was up to [facility] but very weak and dyspneic with this activity. Bladder scanned after void, and no residual visualized. PureWick placed for ease of voiding. Pt does not wish to have a urinary catheter. Evaluated by cardiology. No anticoagulation at present; last dose Eliquis per pt was last Thursday. Plan for thoracentesis tomorrow. Nephrology consult also ordered; has history of CKD.  ? ?Patient also experienced epistaxis today; intermittently dripping from nose, and she has also coughed up moderate sized clots. Chronically wears o2 at 2 liters. 3 LPM in hospital, and this is humidified. Desats to low 80's when taking nasal cannula off to manage bleeding. Afrin given. Wearing NRB mask at times to maintain sats above 90. ENT was consulted, and in to see pt. Patient unable to tolerate squeezing nares, and also unable to tolerate nasal clips. No obvious bleeding at present. ? ?Patient remains alert and oriented and states understanding of all care and treatments given today. Eating approximately 50% of meals. Skin intact. ? ?Problem: Education: ?Goal: Knowledge of General Education information will improve ?Description: Including pain rating scale, medication(s)/side effects and non-pharmacologic comfort measures ?Outcome: Progressing ?  ?Problem: Health Behavior/Discharge Planning: ?Goal: Ability to manage health-related needs will improve ?Outcome: Progressing ?  ?Problem: Clinical Measurements: ?Goal: Ability to maintain clinical measurements within normal limits will improve ?Outcome: Progressing ?Goal: Will remain free from infection ?Outcome: Progressing ?Goal: Diagnostic test results will improve ?Outcome:  Progressing ?Goal: Respiratory complications will improve ?Outcome: Progressing ?Goal: Cardiovascular complication will be avoided ?Outcome: Progressing ?  ?Problem: Activity: ?Goal: Risk for activity intolerance will decrease ?Outcome: Progressing ?  ?Problem: Nutrition: ?Goal: Adequate nutrition will be maintained ?Outcome: Progressing ?  ?Problem: Coping: ?Goal: Level of anxiety will decrease ?Outcome: Progressing ?  ?Problem: Elimination: ?Goal: Will not experience complications related to bowel motility ?Outcome: Progressing ?Goal: Will not experience complications related to urinary retention ?Outcome: Progressing ?  ?Problem: Pain Managment: ?Goal: General experience of comfort will improve ?Outcome: Progressing ?  ?Problem: Safety: ?Goal: Ability to remain free from injury will improve ?Outcome: Progressing ?  ?Problem: Skin Integrity: ?Goal: Risk for impaired skin integrity will decrease ?Outcome: Progressing ?  ?

## 2022-01-27 NOTE — Progress Notes (Signed)
?Progress Note ? ? ?Patient: Danielle [name] FVC:[id number] DOB: 07-19-1956 DOA: 01/26/2022     1 ?DOS: the patient was seen and examined on 01/27/2022 ?  ?Brief hospital course: ?66 y.o. female with medical history significant for COPD on 2 L home O2, CHF, CKD 4, remote history of breast cancer, history of lung cancer, esophageal cancer, MGUS, pression, hypertension, hyperlipidemia, A-fib on Eliquis, who presents with shortness of breath. ? ?4/15: Cardio consult for A-flutter.  Nephro consult for acute on CKD stage IV, thoracentesis tomorrow under ultrasound guidance.  Eliquis on hold.  Epistaxis-ENT evaluation if not stop by conservative management ? ? ?Assessment and Plan: ?* Pleural effusion ?US guided thoracentesis tomorrow. Patient last took Eliquis on Thursday per her.  May benefit from Pleurx to help avoid further episodes of reaccumulation. ?-PCCM following ? ?Epistaxis ?No heavy bleed.  Nursing was unable to control with pressure.  ENT consult has been requested and recommended conservative management.  Hold Eliquis ? ?Known medical problems ?Depression-continue citalopram ?GERD-continue PPI ?Hypertension-hold midodrine ? ?Elevated troponin ?Due to demand ischemia.  Cardiology consult ? ?CKD (chronic kidney disease), stage IV ([place]) ?Some worsening due to diuresis.  She will be on 40 mg IV twice daily of Lasix starting tomorrow.  We will consult nephrology as she is not making much urine ?Her kidney function might worsen with diuresis ? ?Atrial flutter ([name]) ?Cardiology consult.  Hold Eliquis pending upcoming thoracentesis. ?Continue amiodarone, metoprolol and starting Cardizem drip for rate control ? ?HTN (hypertension) ?Blood pressure is soft.  Continue amiodarone and metoprolol.  Cardiology consult.  Started on Cardizem drip for rate control ? ?Malignant neoplasm of upper lobe of right lung ([place]) ?Followed by Dr. Rogue Bussing ? ?Moderate COPD (chronic obstructive pulmonary disease) ([place]) ?No evidence of acute  exacerbation at present, on 3 L oxygen ? ? ? ? ?  ? ?Subjective: Complains of shortness of breath and palpitation ? ?Physical Exam: ?Vitals:  ? 01/27/22 1615 01/27/22 1630 01/27/22 1644 01/27/22 1645  ?BP: 109/76 113/83  107/67  ?Pulse: (!) 110   (!) 127  ?Resp: (!) 27 18  (!) 24  ?Temp:   (!) 97.4 ?F (36.3 ?C)   ?TempSrc:   Oral   ?SpO2: 99%   96%  ?Weight:      ?Height:      ? ?Constitutional:   ?   Appearance: She is cachectic. She is ill-appearing. She is not diaphoretic.  ?Eyes:  ?   General: No scleral icterus. ?Cardiovascular:  ?   Rate and Rhythm: Tachycardia present. Rhythm irregular.  ?   Heart sounds: No murmur heard. ?Pulmonary:  ?   Effort: Tachypnea present.  ?   Breath sounds: Examination of the right-upper field reveals decreased breath sounds. Examination of the right-middle field reveals decreased breath sounds and rales. Examination of the right-lower field reveals decreased breath sounds and rales. Examination of the left-lower field reveals rales. Decreased breath sounds and rales present.  ?Abdominal:  ?   General: Abdomen is flat.  ?   Palpations: Abdomen is soft.  ?Musculoskeletal:  ?   Right lower leg: No edema.  ?   Left lower leg: No edema.  ?Skin: ?   General: Skin is warm and dry.  ?Neurological:  ?   General: No focal deficit present.  ?   Mental Status: She is alert.  ?Psychiatric:     ?   Behavior: Behavior normal. Behavior is cooperative.     ?   Thought Content: Thought content normal.  ? ?  Data Reviewed: ? ?BNP 1570, Creat 2.1 ? ?Family Communication: none ? ?Disposition: ?Status is: Inpatient ?Remains inpatient appropriate because: multi-organ failure ? ? Planned Discharge Destination: Home with Home Health ? ? ?DVT prophylaxis - SCDs ?Time spent: 35 minutes ? ?Author: Max Sane, MD ?01/27/2022 5:00 PM ? ?For on call review [email or web address].  ?

## 2022-01-27 NOTE — Assessment & Plan Note (Signed)
Blood pressure is soft.  Continue amiodarone and metoprolol.  Cardiology consult.  Started on Cardizem drip for rate control ?

## 2022-01-27 NOTE — Assessment & Plan Note (Signed)
Followed by Dr. Rogue Bussing ?

## 2022-01-27 NOTE — Consult Note (Signed)
? ? ? ?CRITICAL CARE  ? ? ? ? ? ? ? ? ?Date: 01/27/2022,   ?MRN# [id number] Danielle [name] April 01, 1956 ? ? ?  ?AdmissionWeight: 53.7 kg                 ?CurrentWeight: 53.7 kg ? ?Referring provider: Dr Dione Plover ? ? ?CHIEF COMPLAINT:  ? ?Acute on chronic hypoxemic respiratory failure ? ? ?HISTORY OF PRESENT ILLNESS  ? ?This is a 66 yo F with PMH as below with significant hx of advanced COPD and chronic hypoxemia on supplemental O2 at home 2l/minnc wth CKD/CHF overlap, hx of breast and lung ca s/p SBRT, MDD, HTD, dyslipidemia, AF on eliquis who came in with DOE and worsening sob at rest.  She had Cdiff infection recently. Patient does have stable vital signs but is with significant abnormalities on labwork including markedly elevated BNP, and abronmal CT chest with compressive atelectasis of right lung with sorrounding moderate pleural effusion.  ? ? ?PAST MEDICAL HISTORY  ? ?Past Medical History:  ?Diagnosis Date  ? Anemia   ? Anxiety   ? Aortic atherosclerosis ([place])   ? Aortic valve stenosis 02/10/2018  ? a.) TTE 02/10/2018: EF 55-60%: mild AS with MPG of 12 mmHg. b.) TTE 04/21/2018: EF 35-40%; mild AS with MPG 8 mmHg. c.) TTE 11/07/2019: EF 50-55%; mild AS with MPG 10 mmHg.  ? Arthritis   ? Atrial fibrillation ([place])   ? a.) CHA2DS2-VASc = 4 (age, sex, HTN, aortic plaque). b.) Rate/rhythm maintained on oral carvedilol; chronically anticoagulated on dose reduced apixaban. c.)  Attempted deployment of LAA occlusive device on 01/11/2021; parameters failed and procedure aborted.  ? Breast cancer, left ([place]) 2000  ? a.) T2N1M0; ER/PR (+) --> Tx'd with total mastectomy, LN resection, XRT, and chemotherapy  ? Cancer of right lung ([place]) 07/30/2016  ? a.) adenocarcinoma; ALK, ROS1, PDL1, BRAF, EGFR all negative.  ? Chronic diastolic CHF (congestive heart failure) ([place]) 11/26/2021  ? CKD (chronic kidney disease), stage IV ([place])   ? COPD (chronic obstructive pulmonary disease) ([place])   ? Dependence on supplemental oxygen   ?  Depression   ? Diastolic dysfunction 20/35/5974  ? a.) TTE 02/10/2017: EF 55-60%; G2DD. b.) TTE 04/21/2018: EF 35-40%; mild LA dilation; mod MV regurgitation. c.) TTE 11/07/2019: EF 50-55%; G1DD.  ? DOE (dyspnea on exertion)   ? GERD (gastroesophageal reflux disease)   ? Heart murmur   ? History of 2019 novel coronavirus disease (COVID-19) 10/14/2019  ? HLD (hyperlipidemia)   ? Hypertension   ? Long term current use of anticoagulant   ? a.) apixaban  ? Lymphedema   ? Personal history of chemotherapy   ? Personal history of radiation therapy   ? SBO (small bowel obstruction) ([place]) 11/05/2020  ? Vitamin D deficiency   ? ? ? ?SURGICAL HISTORY  ? ?Past Surgical History:  ?Procedure Laterality Date  ? Breast Biospy Left   ? [place]  ? BREAST SURGERY    ? COLONOSCOPY N/A 04/30/2018  ? Procedure: COLONOSCOPY;  Surgeon: Virgel Manifold, MD;  Location[facility] ENDOSCOPY;  Service: Endoscopy;  Laterality: N/A;  ? COLONOSCOPY N/A 07/22/2018  ? Procedure: COLONOSCOPY;  Surgeon: Virgel Manifold, MD;  Location[facility]COPY;  Service: Endoscopy;  Laterality: N/A;  ? COLONOSCOPY WITH PROPOFOL N/A 09/21/2021  ? Procedure: COLONOSCOPY WITH PROPOFOL;  Surgeon: Benjamine Sprague, DO;  Location: [facility] ENDOSCOPY;  Service: General;  Laterality: N/A;  ? DILATION AND CURETTAGE OF UTERUS    ? ELECTROMAGNETIC  NAVIGATION BROCHOSCOPY Right 04/11/2016  ? Procedure: ELECTROMAGNETIC NAVIGATION BRONCHOSCOPY;  Surgeon: Vilinda Boehringer, MD;  Location: [facility] ORS;  Service: Cardiopulmonary;  Laterality: Right;  ? ESOPHAGOGASTRODUODENOSCOPY N/A 07/22/2018  ? Procedure: ESOPHAGOGASTRODUODENOSCOPY (EGD);  Surgeon: Virgel Manifold, MD;  Location: [facility] ENDOSCOPY;  Service: Endoscopy;  Laterality: N/A;  ? ESOPHAGOGASTRODUODENOSCOPY (EGD) WITH PROPOFOL N/A 05/07/2018  ? Procedure: ESOPHAGOGASTRODUODENOSCOPY (EGD) WITH PROPOFOL;  Surgeon: Lucilla Lame, MD;  Location[facility] ENDOSCOPY;  Service: Endoscopy;  Laterality: N/A;  ? ESOPHAGOGASTRODUODENOSCOPY (EGD)  WITH PROPOFOL N/A 04/24/2019  ? Procedure: ESOPHAGOGASTRODUODENOSCOPY (EGD) WITH PROPOFOL;  Surgeon: Jonathon Bellows, MD;  Location: [facility] ENDOSCOPY;  Service: Gastroenterology;  Laterality: N/A;  ? ESOPHAGOGASTRODUODENOSCOPY (EGD) WITH PROPOFOL N/A 01/12/2020  ? Procedure: ESOPHAGOGASTRODUODENOSCOPY (EGD) WITH PROPOFOL;  Surgeon: Jonathon Bellows, MD;  Location: [facility] ENDOSCOPY;  Service: Gastroenterology;  Laterality: N/A;  ? ESOPHAGOGASTRODUODENOSCOPY (EGD) WITH PROPOFOL N/A 04/28/2020  ? Procedure: ESOPHAGOGASTRODUODENOSCOPY (EGD) WITH PROPOFOL;  Surgeon: Jonathon Bellows, MD;  Location: [facility] ENDOSCOPY;  Service: Gastroenterology;  Laterality: N/A;  ? EUS N/A 05/07/2019  ? Procedure: FULL UPPER ENDOSCOPIC ULTRASOUND (EUS) RADIAL;  Surgeon: Jola Schmidt, MD;  Location: [facility] ENDOSCOPY;  Service: Endoscopy;  Laterality: N/A;  ? ILEOSCOPY N/A 07/22/2018  ? Procedure: ILEOSCOPY THROUGH STOMA;  Surgeon: Virgel Manifold, MD;  Location: [facility] ENDOSCOPY;  Service: Endoscopy;  Laterality: N/A;  ? ILEOSTOMY    ? ILEOSTOMY N/A 09/08/2018  ? Procedure: ILEOSTOMY REVISION POSSIBLE CREATION;  Surgeon: Herbert Pun, MD;  Location: [facility] ORS;  Service: General;  Laterality: N/A;  ? ILEOSTOMY CLOSURE N/A 08/15/2018  ? Procedure: DILATION OF ILEOSTOMY STRICTURE;  Surgeon: Herbert Pun, MD;  Location: [facility] ORS;  Service: General;  Laterality: N/A;  ? LAPAROTOMY Right 05/04/2018  ? Procedure: EXPLORATORY LAPAROTOMY right colectomy right and left ostomy;  Surgeon: Herbert Pun, MD;  Location: [facility] ORS;  Service: General;  Laterality: Right;  ? LEFT ATRIAL APPENDAGE OCCLUSION N/A 01/11/2021  ? Procedure: LEFT ATRIAL APPENDAGE OCCLUSION ([place]); ABORTED PROCEDURE WITHOUT DEVICE BEING IMPLANTED; Location: [facility]; Surgeon: Mylinda Latina, MD  ? LUNG BIOPSY    ? MASTECTOMY Left   ? 2000, [facility]  ? ROTATOR CUFF REPAIR Right   ? [place]  ? XI ROBOTIC ASSISTED COLOSTOMY TAKEDOWN N/A 10/23/2021  ? Procedure: XI ROBOTIC ASSISTED  ILEOSTOMY TAKEDOWN;  Surgeon: Herbert Pun, MD;  Location: [facility] ORS;  Service: General;  Laterality: N/A;  180 minutes for the surgery part please  ? ? ? ?FAMILY HISTORY  ? ?Family History  ?Problem Relation Age of Onset  ? Breast cancer Mother 49  ? Cancer Mother   ?     Breast   ? Cirrhosis Father   ? Breast cancer Paternal Aunt 91  ? Cancer Maternal Aunt   ?     Breast   ? ? ? ?SOCIAL HISTORY  ? ?Social History  ? ?Tobacco Use  ? Smoking status: Former  ?  Packs/day: 0.50  ?  Years: 20.00  ?  Pack years: 10.00  ?  Types: Cigarettes  ?  Quit date: 07/02/2012  ?  Years since quitting: 9.5  ? Smokeless tobacco: Current  ?  Types: Snuff  ? Tobacco comments:  ?  quit 2014  ?Vaping Use  ? Vaping Use: Never used  ?Substance Use Topics  ? Alcohol use: Yes  ?  Comment: Occasionally beer  ? Drug use: No  ? ? ? ?MEDICATIONS  ? ? ?Home Medication:  ?  ?Current Medication: ? ?Current Facility-Administered Medications:  ?  acetaminophen (TYLENOL)  tablet 650 mg, 650 mg, Oral, Q6H PRN **OR** acetaminophen (TYLENOL) suppository 650 mg, 650 mg, Rectal, Q6H PRN, Clarnce Flock, MD ?  albuterol (PROVENTIL) (2.5 MG/3ML) 0.083% nebulizer solution 3 mL, 3 mL, Inhalation, Q6H PRN, Clarnce Flock, MD ?  amiodarone (PACERONE) tablet 200 mg, 200 mg, Oral, BID, Clarnce Flock, MD, 200 mg at 01/26/22 2118 ?  Chlorhexidine Gluconate Cloth 2 % PADS 6 each, 6 each, Topical, Daily, Clarnce Flock, MD ?  citalopram (CELEXA) tablet 40 mg, 40 mg, Oral, Daily, Clarnce Flock, MD ?  guaiFENesin-dextromethorphan (ROBITUSSIN DM) 100-10 MG/5ML syrup 5 mL, 5 mL, Oral, Q4H PRN, Clarnce Flock, MD, 5 mL at 01/27/22 0553 ?  metoprolol tartrate (LOPRESSOR) tablet 12.5 mg, 12.5 mg, Oral, BID, Clarnce Flock, MD, 12.5 mg at 01/26/22 1625 ?  ondansetron (ZOFRAN) tablet 4 mg, 4 mg, Oral, Q6H PRN **OR** ondansetron (ZOFRAN) injection 4 mg, 4 mg, Intravenous, Q6H PRN, Clarnce Flock, MD ?  oxyCODONE (Oxy IR/ROXICODONE)  immediate release tablet 5 mg, 5 mg, Oral, Q4H PRN, Clarnce Flock, MD ?  pantoprazole (PROTONIX) EC tablet 40 mg, 40 mg, Oral, BID AC, Clarnce Flock, MD ?  phenol (CHLORASEPTIC) mouth spray 1 spray,

## 2022-01-27 NOTE — Assessment & Plan Note (Addendum)
No heavy bleed.  Nursing was unable to control with pressure.  ENT consult has been requested and recommended conservative management.  Hold Eliquis ?

## 2022-01-27 NOTE — Consult Note (Signed)
[facility] Cardiology ? ?CARDIOLOGY CONSULT NOTE  ?Patient ID: ?Danielle [name] ?MRN: [id number] ?DOB/AGE: 1956-05-17 66 y.o. ? ?Admit date: 01/26/2022 ?Referring Physician [name] ?Primary Physician Center, [facility] ?Primary Cardiologist [name] ?Reason for Consultation AF ? ?HPI:  ?Danielle [name] is a 66 year old female with history of advanced COPD on chronic oxygen (2 L), CKD (baseline creatinine 2-2.5), atrial fibrillation, mild aortic stenosis who was admitted with shortness of breath.  Cardiology is consulted for assistance with atrial fibrillation. ? ?Patient was recently admitted from February 12 to 15, during that admission presented with abdominal pain and diarrhea and was found to have C. difficile colitis.  Also admitted from January 9 to February 10, during that admission underwent robotic assisted laparoscopic ileostomy reversal.  Subsequently developed multiple complications including acute on chronic kidney failure, postoperative ileus, and recurrent lateral effusions.  Was treated for her pleural effusions with thoracentesis x2.  During that admission she had some episodes of atrial fibrillation with RVR as well as some episodes of relative bradycardia.  General long-acting diltiazem, and given metoprolol only as needed. ? ?Prior to her current presentation she felt that she became progressively short of breath over the previous week to the point of not being able to take a deep breath or complete full sentences.  She went to see her primary care provider and was in A-fib with RVR so she was referred to the emergency department.  On arrival to the emergency department she was in A-fib with RVR, and required 4 L oxygen to maintain her oxygen saturations.  Her troponin was flat at 37.  Her BNP was elevated to 1570.  CT scan showed a moderate-sized right pleural effusion. ? ?At the time of my evaluation she has epistaxis and doesn't provide much history. Nods yes/ no to questions.  ? ?Review of  systems complete and found to be negative unless listed above  ? ? ? ?Past Medical History:  ?Diagnosis Date  ? Anemia   ? Anxiety   ? Aortic atherosclerosis ([place])   ? Aortic valve stenosis 02/10/2018  ? a.) TTE 02/10/2018: EF 55-60%: mild AS with MPG of 12 mmHg. b.) TTE 04/21/2018: EF 35-40%; mild AS with MPG 8 mmHg. c.) TTE 11/07/2019: EF 50-55%; mild AS with MPG 10 mmHg.  ? Arthritis   ? Atrial fibrillation ([place])   ? a.) CHA2DS2-VASc = 4 (age, sex, HTN, aortic plaque). b.) Rate/rhythm maintained on oral carvedilol; chronically anticoagulated on dose reduced apixaban. c.)  Attempted deployment of LAA occlusive device on 01/11/2021; parameters failed and procedure aborted.  ? Breast cancer, left ([place]) 2000  ? a.) T2N1M0; ER/PR (+) --> Tx'd with total mastectomy, LN resection, XRT, and chemotherapy  ? Cancer of right lung ([place]) 07/30/2016  ? a.) adenocarcinoma; ALK, ROS1, PDL1, BRAF, EGFR all negative.  ? Chronic diastolic CHF (congestive heart failure) ([place]) 11/26/2021  ? CKD (chronic kidney disease), stage IV ([place])   ? COPD (chronic obstructive pulmonary disease) ([place])   ? Dependence on supplemental oxygen   ? Depression   ? Diastolic dysfunction 05/39/7673  ? a.) TTE 02/10/2017: EF 55-60%; G2DD. b.) TTE 04/21/2018: EF 35-40%; mild LA dilation; mod MV regurgitation. c.) TTE 11/07/2019: EF 50-55%; G1DD.  ? DOE (dyspnea on exertion)   ? GERD (gastroesophageal reflux disease)   ? Heart murmur   ? History of 2019 novel coronavirus disease (COVID-19) 10/14/2019  ? HLD (hyperlipidemia)   ? Hypertension   ? Long term current use of anticoagulant   ?  a.) apixaban  ? Lymphedema   ? Personal history of chemotherapy   ? Personal history of radiation therapy   ? SBO (small bowel obstruction) ([place]) 11/05/2020  ? Vitamin D deficiency   ?  ?Past Surgical History:  ?Procedure Laterality Date  ? Breast Biospy Left   ? [place]  ? BREAST SURGERY    ? COLONOSCOPY N/A 04/30/2018  ? Procedure: COLONOSCOPY;  Surgeon: Virgel Manifold,  MD;  Location: [facility] ENDOSCOPY;  Service: Endoscopy;  Laterality: N/A;  ? COLONOSCOPY N/A 07/22/2018  ? Procedure: COLONOSCOPY;  Surgeon: Virgel Manifold, MD;  Location: [facility] ENDOSCOPY;  Service: Endoscopy;  Laterality: N/A;  ? COLONOSCOPY WITH PROPOFOL N/A 09/21/2021  ? Procedure: COLONOSCOPY WITH PROPOFOL;  Surgeon: Benjamine Sprague, DO;  Location: [facility] ENDOSCOPY;  Service: General;  Laterality: N/A;  ? DILATION AND CURETTAGE OF UTERUS    ? ELECTROMAGNETIC NAVIGATION BROCHOSCOPY Right 04/11/2016  ? Procedure: ELECTROMAGNETIC NAVIGATION BRONCHOSCOPY;  Surgeon: Vilinda Boehringer, MD;  Location: [facility] ORS;  Service: Cardiopulmonary;  Laterality: Right;  ? ESOPHAGOGASTRODUODENOSCOPY N/A 07/22/2018  ? Procedure: ESOPHAGOGASTRODUODENOSCOPY (EGD);  Surgeon: Virgel Manifold, MD;  Location: [facility] ENDOSCOPY;  Service: Endoscopy;  Laterality: N/A;  ? ESOPHAGOGASTRODUODENOSCOPY (EGD) WITH PROPOFOL N/A 05/07/2018  ? Procedure: ESOPHAGOGASTRODUODENOSCOPY (EGD) WITH PROPOFOL;  Surgeon: Lucilla Lame, MD;  Location: [facility] ENDOSCOPY;  Service: Endoscopy;  Laterality: N/A;  ? ESOPHAGOGASTRODUODENOSCOPY (EGD) WITH PROPOFOL N/A 04/24/2019  ? Procedure: ESOPHAGOGASTRODUODENOSCOPY (EGD) WITH PROPOFOL;  Surgeon: Jonathon Bellows, MD;  Location: [facility] ENDOSCOPY;  Service: Gastroenterology;  Laterality: N/A;  ? ESOPHAGOGASTRODUODENOSCOPY (EGD) WITH PROPOFOL N/A 01/12/2020  ? Procedure: ESOPHAGOGASTRODUODENOSCOPY (EGD) WITH PROPOFOL;  Surgeon: Jonathon Bellows, MD;  Location: [facility] ENDOSCOPY;  Service: Gastroenterology;  Laterality: N/A;  ? ESOPHAGOGASTRODUODENOSCOPY (EGD) WITH PROPOFOL N/A 04/28/2020  ? Procedure: ESOPHAGOGASTRODUODENOSCOPY (EGD) WITH PROPOFOL;  Surgeon: Jonathon Bellows, MD;  Location: [facility] ENDOSCOPY;  Service: Gastroenterology;  Laterality: N/A;  ? EUS N/A 05/07/2019  ? Procedure: FULL UPPER ENDOSCOPIC ULTRASOUND (EUS) RADIAL;  Surgeon: Jola Schmidt, MD;  Location: [facility] ENDOSCOPY;  Service: Endoscopy;  Laterality: N/A;  ? ILEOSCOPY N/A  07/22/2018  ? Procedure: ILEOSCOPY THROUGH STOMA;  Surgeon: Virgel Manifold, MD;  Location: [facility] ENDOSCOPY;  Service: Endoscopy;  Laterality: N/A;  ? ILEOSTOMY    ? ILEOSTOMY N/A 09/08/2018  ? Procedure: ILEOSTOMY REVISION POSSIBLE CREATION;  Surgeon: Herbert Pun, MD;  Location: [facility] ORS;  Service: General;  Laterality: N/A;  ? ILEOSTOMY CLOSURE N/A 08/15/2018  ? Procedure: DILATION OF ILEOSTOMY STRICTURE;  Surgeon: Herbert Pun, MD;  Location: [facility] ORS;  Service: General;  Laterality: N/A;  ? LAPAROTOMY Right 05/04/2018  ? Procedure: EXPLORATORY LAPAROTOMY right colectomy right and left ostomy;  Surgeon: Herbert Pun, MD;  Location: [facility] ORS;  Service: General;  Laterality: Right;  ? LEFT ATRIAL APPENDAGE OCCLUSION N/A 01/11/2021  ? Procedure: LEFT ATRIAL APPENDAGE OCCLUSION ([place]); ABORTED PROCEDURE WITHOUT DEVICE BEING IMPLANTED; Location: [facility]; Surgeon: Mylinda Latina, MD  ? LUNG BIOPSY    ? MASTECTOMY Left   ? 2000, [facility]  ? ROTATOR CUFF REPAIR Right   ? [place]  ? XI ROBOTIC ASSISTED COLOSTOMY TAKEDOWN N/A 10/23/2021  ? Procedure: XI ROBOTIC ASSISTED ILEOSTOMY TAKEDOWN;  Surgeon: Herbert Pun, MD;  Location: [facility] ORS;  Service: General;  Laterality: N/A;  180 minutes for the surgery part please  ?  ?Medications Prior to Admission  ?Medication Sig Dispense Refill Last Dose  ? amiodarone (PACERONE) 200 MG tablet Take 1 tablet (200 mg total) by mouth 2 (two) times daily. 60 tablet 0  01/25/2022 at 2200  ? apixaban (ELIQUIS) 2.5 MG TABS tablet Take 2.5 mg by mouth 2 (two) times daily.   01/25/2022 at 2200  ? citalopram (CELEXA) 40 MG tablet Take 40 mg by mouth every morning.   01/25/2022 at 0900  ? Fluticasone-Umeclidin-Vilant 100-62.5-25 MCG/INH AEPB Inhale 1 puff into the lungs every morning.   01/25/2022 at 0900  ? metoprolol tartrate (LOPRESSOR) 25 MG tablet Take 0.5 tablets (12.5 mg total) by mouth 2 (two) times daily. 60 tablet 0 01/25/2022 at 2200  ? pantoprazole  (PROTONIX) 40 MG tablet Take 1 tablet (40 mg total) by mouth 2 (two) times daily before a meal. 60 tablet 2 01/25/2022 at 1700  ? acetaminophen (TYLENOL) 325 MG tablet Take 2 tablets (650 mg total) by mouth e

## 2022-01-27 NOTE — Hospital Course (Addendum)
66 y.o. female with medical history significant for COPD on 2 L home O2, CHF, CKD 4, remote history of breast cancer, history of lung cancer, esophageal cancer, MGUS, pression, hypertension, hyperlipidemia, A-fib on Eliquis, who presents with shortness of breath. ? ?4/15: Cardio consult for A-flutter.  Nephro consult for acute on CKD stage IV, thoracentesis tomorrow under ultrasound guidance.  Eliquis on hold.  Epistaxis-ENT evaluation if not stop by conservative management ?4/16: Status post US guided thoracentesis with removal of 900 cc of fluid ?4/17: Suspected TIA involving left MCA territory last night.  Patient refusing MRI.  Eliquis restarted.  Started statin.  Seen by Neurology. ?4/18: Echo shows EF of 35 to 40%.  Carotid Doppler shows no hemodynamically significant stenosis.  Blood pressure running low. ?4/19: R eye blurred vision reported this AM. Pt continues to decline to have MRI.  Improved later this AM.  No other focal neurologic symptoms.  Cr continues to improve. ? ?4/20: HR's very labile from 40's to 110's.  Tachycardic and short of breath on exam. ? ?4/21: new sore throat, reports cough, feels unwell, still quite dyspneic with exertion ?

## 2022-01-28 ENCOUNTER — Inpatient Hospital Stay: Payer: Medicare Other

## 2022-01-28 DIAGNOSIS — J90 Pleural effusion, not elsewhere classified: Secondary | ICD-10-CM | POA: Diagnosis not present

## 2022-01-28 DIAGNOSIS — I5022 Chronic systolic (congestive) heart failure: Secondary | ICD-10-CM | POA: Diagnosis not present

## 2022-01-28 DIAGNOSIS — N184 Chronic kidney disease, stage 4 (severe): Secondary | ICD-10-CM | POA: Diagnosis not present

## 2022-01-28 DIAGNOSIS — I484 Atypical atrial flutter: Secondary | ICD-10-CM | POA: Diagnosis not present

## 2022-01-28 LAB — CBC
HCT: 28.7 % — ABNORMAL LOW (ref 36.0–46.0)
Hemoglobin: 9 g/dL — ABNORMAL LOW (ref 12.0–15.0)
MCH: 29.9 pg (ref 26.0–34.0)
MCHC: 31.4 g/dL (ref 30.0–36.0)
MCV: 95.3 fL (ref 80.0–100.0)
Platelets: 206 10*3/uL (ref 150–400)
RBC: 3.01 MIL/uL — ABNORMAL LOW (ref 3.87–5.11)
RDW: 16.8 % — ABNORMAL HIGH (ref 11.5–15.5)
WBC: 7.2 10*3/uL (ref 4.0–10.5)
nRBC: 1 % — ABNORMAL HIGH (ref 0.0–0.2)

## 2022-01-28 LAB — ALBUMIN, PLEURAL OR PERITONEAL FLUID: Albumin, Fluid: 1.8 g/dL

## 2022-01-28 LAB — BASIC METABOLIC PANEL
Anion gap: 7 (ref 5–15)
BUN: 52 mg/dL — ABNORMAL HIGH (ref 8–23)
CO2: 26 mmol/L (ref 22–32)
Calcium: 8.5 mg/dL — ABNORMAL LOW (ref 8.9–10.3)
Chloride: 104 mmol/L (ref 98–111)
Creatinine, Ser: 2.63 mg/dL — ABNORMAL HIGH (ref 0.44–1.00)
GFR, Estimated: 19 mL/min — ABNORMAL LOW (ref >60)
Glucose, Bld: 109 mg/dL — ABNORMAL HIGH (ref 70–99)
Potassium: 4 mmol/L (ref 3.5–5.1)
Sodium: 137 mmol/L (ref 135–145)

## 2022-01-28 LAB — PROTEIN, PLEURAL OR PERITONEAL FLUID: Total protein, fluid: <3 g/dL

## 2022-01-28 LAB — BODY FLUID CELL COUNT WITH DIFFERENTIAL
Eos, Fluid: 0 %
Lymphs, Fluid: 45 %
Monocyte-Macrophage-Serous Fluid: 12 % — ABNORMAL LOW (ref 50–90)
Neutrophil Count, Fluid: 43 % — ABNORMAL HIGH (ref 0–25)
Other Cells, Fluid: 0 %
Total Nucleated Cell Count, Fluid: 1092 cu mm — ABNORMAL HIGH (ref 0–1000)

## 2022-01-28 LAB — MAGNESIUM: Magnesium: 1.5 mg/dL — ABNORMAL LOW (ref 1.7–2.4)

## 2022-01-28 LAB — GLUCOSE, PLEURAL OR PERITONEAL FLUID: Glucose, Fluid: 101 mg/dL

## 2022-01-28 LAB — AMYLASE, PLEURAL OR PERITONEAL FLUID: Amylase, Fluid: 63 U/L

## 2022-01-28 LAB — PHOSPHORUS: Phosphorus: 3.8 mg/dL (ref 2.5–4.6)

## 2022-01-28 LAB — LACTATE DEHYDROGENASE, PLEURAL OR PERITONEAL FLUID: LD, Fluid: 57 U/L — ABNORMAL HIGH (ref 3–23)

## 2022-01-28 MED ORDER — METOPROLOL TARTRATE 25 MG PO TABS
12.5000 mg | ORAL_TABLET | Freq: Four times a day (QID) | ORAL | Status: DC
Start: 2022-01-28 — End: 2022-01-28

## 2022-01-28 MED ORDER — MAGNESIUM SULFATE 2 GM/50ML IV SOLN
2.0000 g | Freq: Once | INTRAVENOUS | Status: AC
  Administered 2022-01-28: 2 g via INTRAVENOUS
  Filled 2022-01-28: qty 50

## 2022-01-28 MED ORDER — FUROSEMIDE 10 MG/ML IJ SOLN
40.0000 mg | Freq: Every day | INTRAMUSCULAR | Status: DC
  Filled 2022-01-28: qty 4

## 2022-01-28 MED ORDER — METOPROLOL TARTRATE 25 MG PO TABS
12.5000 mg | ORAL_TABLET | Freq: Four times a day (QID) | ORAL | Status: DC
  Administered 2022-01-28 – 2022-02-04 (×23): 12.5 mg via ORAL
  Filled 2022-01-28 (×24): qty 1

## 2022-01-28 NOTE — Assessment & Plan Note (Signed)
Due to demand ischemia.  Cardiology following. ?

## 2022-01-28 NOTE — Assessment & Plan Note (Addendum)
S/p US guided thoracentesis on 4/16 with 900 mL of fluid removal.  Labs sent ?Lasix 40 mg IV daily per nephrology ?

## 2022-01-28 NOTE — Progress Notes (Signed)
?  Progress Note ? ? ?Patient: Danielle [name] ZOX:[id number] DOB: 06/20/1956 DOA: 01/26/2022     2 ?DOS: the patient was seen and examined on 01/28/2022 ?  ?Brief hospital course: ?66 y.o. female with medical history significant for COPD on 2 L home O2, CHF, CKD 4, remote history of breast cancer, history of lung cancer, esophageal cancer, MGUS, pression, hypertension, hyperlipidemia, A-fib on Eliquis, who presents with shortness of breath. ? ?4/15: Cardio consult for A-flutter.  Nephro consult for acute on CKD stage IV, thoracentesis tomorrow under ultrasound guidance.  Eliquis on hold.  Epistaxis-ENT evaluation if not stop by conservative management ? ? ?Assessment and Plan: ?* Pleural effusion ?S/p US guided thoracentesis on 4/16 with 900 mL of fluid removal.  Labs sent ?Lasix 40 mg IV daily per nephrology ? ?Epistaxis ?Resolved now.  ENT input appreciated.  Hold Eliquis ? ?Known medical problems ?Depression-continue citalopram ?GERD-continue PPI ?Hypertension-hold midodrine ? ?Elevated troponin ?Due to demand ischemia.  Cardiology following. ? ?CKD (chronic kidney disease), stage IV ([place]) ?Nephrology following.  Continue IV Lasix 40 mg daily for now per nephrology team ? ?Malignant neoplasm of lower third of esophagus (HCC) ?Follows with Dr. Rogue Bussing ? ?Atrial flutter ([place]) ?Cardiology input appreciated -consider resuming Eliquis if no further epistaxis by tomorrow.  Thoracentesis completed today ?Continue amiodarone, metoprolol.  Discontinue Cardizem drip per cardiology.  Rate much better controlled now ? ?HTN (hypertension) ?Blood pressure is soft.  Continue amiodarone and metoprolol.  Cardiology following.  Cardizem on hold ? ?Malignant neoplasm of upper lobe of right lung ([place]) ?Followed by Dr. Rogue Bussing ? ?Moderate COPD (chronic obstructive pulmonary disease) ([place]) ?No evidence of acute exacerbation at present, on 3 L oxygen.  Wean oxygen as able ? ? ? ? ?  ? ?Subjective: Feeling better.  Heart rate well  controlled now.  Going for thoracentesis this morning ? ?Physical Exam: ?Vitals:  ? 01/28/22 0500 01/28/22 0600 01/28/22 0700 01/28/22 0800  ?BP: 95/75 103/76 108/73 99/73  ?Pulse: (!) 59 (!) 58 (!) 58   ?Resp: 14 13 15 15   ?Temp:      ?TempSrc:      ?SpO2: 96% 98% 98% 96%  ?Weight:      ?Height:      ? ?66 year old cachectic female lying in the bed in mild respiratory distress ?Lungs decreased breath sounds at the bases.  No rhonchi ?Cardiovascular S1-S2 normal, no murmur rales or gallop ?Abdomen soft, benign ?Neuro alert and oriented, nonfocal ?Skin no rash or lesion ? ?Data Reviewed: ? ?Creatinine 2.63.  Hemoglobin 9.0 ? ?Family Communication: None ? ?Disposition: ?Status is: Inpatient ?Remains inpatient appropriate because: Kidney failure and pleural effusion management ? ? Planned Discharge Destination: Home with Home Health ? ? ? DVT prophylaxis-SCDs ?Time spent: 35 minutes ? ?Author: ?Max Sane, MD ?01/28/2022 11:38 AM ? ?For on call review [email or web address].  ?

## 2022-01-28 NOTE — Consult Note (Signed)
[facility] ?Consult Note: 01/28/2022 ? ? ? ?Date of Admission:  01/26/2022    ? ?      ?Reason for Consult:  Acute kidney injury   ?Referring Provider: Max Sane, MD ?Primary Care Provider: [facility] ? ? ?History of Presenting Illness:  ?Danielle [name] is a 66 y.o. female with advanced COPD on chronic oxygen, chronic kidney disease, atrial fibrillation, mild aortic stenosis, history of lung cancer, history of esophageal cancer, MGUS, hyperlipidemia was admitted for shortness of breath.  She has also history of right pleural effusion. ?Patient recently has history of CHF in February 2023 ?Had recent robotic assisted laparoscopic ileostomy reversal. ?Her chest x-ray showed increased right pleural effusion with vascular congestion. ?Nephrology consult has been requested because creatinine has increased from 2.11-2.63 ?Review of vitals show patient was hypotensive to 87/76 on 01/28/2019. ?Blood pressure has been low normal. ?He is currently getting aggressive diuretic regimen of furosemide 40 mg IV twice a day ? ?Review of Systems: ?Review of Systems  ?Constitutional:  Positive for malaise/fatigue.  ?HENT:  Positive for nosebleeds and sore throat.   ?Eyes:  Negative for blurred vision.  ?Respiratory:  Positive for shortness of breath. Negative for cough, hemoptysis and sputum production.   ?Cardiovascular:  Negative for chest pain and leg swelling.  ?Gastrointestinal:  Positive for abdominal pain.  ?Genitourinary:  Negative for dysuria.  ?Musculoskeletal:  Negative for myalgias.  ?Skin:  Negative for rash.  ?Neurological:  Negative for dizziness.  ?Endo/Heme/Allergies:  Bruises/bleeds easily.  ?Psychiatric/Behavioral:  Negative for depression.   ? ?Past Medical History:  ?Diagnosis Date  ? Anemia   ? Anxiety   ? Aortic atherosclerosis ([place])   ? Aortic valve stenosis 02/10/2018  ? a.) TTE 02/10/2018: EF 55-60%: mild AS with MPG of 12 mmHg. b.) TTE 04/21/2018: EF 35-40%; mild  AS with MPG 8 mmHg. c.) TTE 11/07/2019: EF 50-55%; mild AS with MPG 10 mmHg.  ? Arthritis   ? Atrial fibrillation ([place])   ? a.) CHA2DS2-VASc = 4 (age, sex, HTN, aortic plaque). b.) Rate/rhythm maintained on oral carvedilol; chronically anticoagulated on dose reduced apixaban. c.)  Attempted deployment of LAA occlusive device on 01/11/2021; parameters failed and procedure aborted.  ? Breast cancer, left ([place]) 2000  ? a.) T2N1M0; ER/PR (+) --> Tx'd with total mastectomy, LN resection, XRT, and chemotherapy  ? Cancer of right lung ([place]) 07/30/2016  ? a.) adenocarcinoma; ALK, ROS1, PDL1, BRAF, EGFR all negative.  ? Chronic diastolic CHF (congestive heart failure) ([place]) 11/26/2021  ? CKD (chronic kidney disease), stage IV ([place])   ? COPD (chronic obstructive pulmonary disease) ([place])   ? Dependence on supplemental oxygen   ? Depression   ? Diastolic dysfunction 26/71/2458  ? a.) TTE 02/10/2017: EF 55-60%; G2DD. b.) TTE 04/21/2018: EF 35-40%; mild LA dilation; mod MV regurgitation. c.) TTE 11/07/2019: EF 50-55%; G1DD.  ? DOE (dyspnea on exertion)   ? GERD (gastroesophageal reflux disease)   ? Heart murmur   ? History of 2019 novel coronavirus disease (COVID-19) 10/14/2019  ? HLD (hyperlipidemia)   ? Hypertension   ? Long term current use of anticoagulant   ? a.) apixaban  ? Lymphedema   ? Personal history of chemotherapy   ? Personal history of radiation therapy   ? SBO (small bowel obstruction) ([place]) 11/05/2020  ? Vitamin D deficiency   ? ? ?Social History  ? ?Tobacco Use  ? Smoking status: Former  ?  Packs/day: 0.50  ?  Years: 20.00  ?  Pack years: 10.00  ?  Types: Cigarettes  ?  Quit date: 07/02/2012  ?  Years since quitting: 9.5  ? Smokeless tobacco: Current  ?  Types: Snuff  ? Tobacco comments:  ?  quit 2014  ?Vaping Use  ? Vaping Use: Never used  ?Substance Use Topics  ? Alcohol use: Yes  ?  Comment: Occasionally beer  ? Drug use: No  ? ? ?Family History  ?Problem Relation Age of Onset  ? Breast cancer Mother 35  ?  Cancer Mother   ?     Breast   ? Cirrhosis Father   ? Breast cancer Paternal Aunt 17  ? Cancer Maternal Aunt   ?     Breast   ? ? ? ?OBJECTIVE: ?Blood pressure 99/73, pulse (!) 58, temperature 98 ?F (36.7 ?C), temperature source Oral, resp. rate 15, height _0  (1.676 m), weight 53.7 kg, SpO2 96 %. ? ?Physical Exam ?Physical Exam: ?General: Thin built, chronically ill appearing. No acute distress  ?HEENT  anicteric, moist oral mucous membrane  ?Pulm/lungs  [place] O2, mild left crackles, decreased breath sounds on right  ?CVS/Heart  irregular rhythm, no rub or gallop  ?Abdomen:   Soft, nontender  ?Extremities:  No peripheral edema  ?Neurologic:  Alert, oriented, able to follow commands  ?Skin:  No acute rashes  ? ? ? ?Lab Results ?Lab Results  ?Component Value Date  ? WBC 7.2 01/28/2022  ? HGB 9.0 (L) 01/28/2022  ? HCT 28.7 (L) 01/28/2022  ? MCV 95.3 01/28/2022  ? PLT 206 01/28/2022  ?  ?Lab Results  ?Component Value Date  ? CREATININE 2.63 (H) 01/28/2022  ? BUN 52 (H) 01/28/2022  ? NA 137 01/28/2022  ? K 4.0 01/28/2022  ? CL 104 01/28/2022  ? CO2 26 01/28/2022  ?  ?Lab Results  ?Component Value Date  ? ALT 13 11/26/2021  ? AST 26 11/26/2021  ? ALKPHOS 67 11/26/2021  ? BILITOT 0.9 11/26/2021  ?  ? ?Microbiology: ?Recent Results (from the past 240 hour(s))  ?Resp Panel by RT-PCR (Flu A&B, Covid) Nasopharyngeal Swab     Status: None  ? Collection Time: 01/26/22  1:32 PM  ? Specimen: Nasopharyngeal Swab; Nasopharyngeal(NP) swabs in vial transport medium  ?Result Value Ref Range Status  ? SARS Coronavirus 2 by RT PCR NEGATIVE NEGATIVE Final  ?  Comment: (NOTE) ?SARS-CoV-2 target nucleic acids are NOT DETECTED. ? ?The SARS-CoV-2 RNA is generally detectable in upper respiratory ?specimens during the acute phase of infection. The lowest ?concentration of SARS-CoV-2 viral copies this assay can detect is ?138 copies/mL. A negative result does not preclude SARS-Cov-2 ?infection and should not be used as the sole basis for  treatment or ?other patient management decisions. A negative result may occur with  ?improper specimen collection/handling, submission of specimen other ?than nasopharyngeal swab, presence of viral mutation(s) within the ?areas targeted by this assay, and inadequate number of viral ?copies(<138 copies/mL). A negative result must be combined with ?clinical observations, patient history, and epidemiological ?information. The expected result is Negative. ? ?Fact Sheet for Patients:  ?EntrepreneurPulse.com.au ? ?Fact Sheet for Healthcare Providers:  ?IncredibleEmployment.be ? ?This test is no t yet approved or cleared by the [place] FDA and  ?has been authorized for detection and/or diagnosis of SARS-CoV-2 by ?FDA under an Emergency Use Authorization (EUA). This EUA will remain  ?in effect (meaning this test can be used) for the duration of the ?COVID-19 declaration under  Section 564(b)(1) of the Act, 21 ?U.S.C.section 360bbb-3(b)(1), unless the authorization is terminated  ?or revoked sooner.  ? ? ?  ? Influenza A by PCR NEGATIVE NEGATIVE Final  ? Influenza B by PCR NEGATIVE NEGATIVE Final  ?  Comment: (NOTE) ?The Xpert Xpress SARS-CoV-2/FLU/RSV plus assay is intended as an aid ?in the diagnosis of influenza from Nasopharyngeal swab specimens and ?should not be used as a sole basis for treatment. Nasal washings and ?aspirates are unacceptable for Xpert Xpress SARS-CoV-2/FLU/RSV ?testing. ? ?Fact Sheet for Patients: ?EntrepreneurPulse.com.au ? ?Fact Sheet for Healthcare Providers: ?IncredibleEmployment.be ? ?This test is not yet approved or cleared by the [place] FDA and ?has been authorized for detection and/or diagnosis of SARS-CoV-2 by ?FDA under an Emergency Use Authorization (EUA). This EUA will remain ?in effect (meaning this test can be used) for the duration of the ?COVID-19 declaration under Section 564(b)(1) of the Act, 21  U.S.C. ?section 360bbb-3(b)(1), unless the authorization is terminated or ?revoked. ? ?Performed at [facility], [place], ?[place] [address] ?  ?MRSA Next Gen by PCR, Nasal     Status: None

## 2022-01-28 NOTE — Assessment & Plan Note (Signed)
Follows with Dr. Rogue Bussing ?

## 2022-01-28 NOTE — Consult Note (Signed)
? ? ? ?CRITICAL CARE  ? ? ? ? ? ? ? ? ?Date: 01/28/2022,   ?MRN# [id number] Danielle [name] June 11, 1956 ? ? ?  ?AdmissionWeight: 53.7 kg                 ?CurrentWeight: 53.7 kg ? ?Referring provider: Dr Dione Plover ? ? ?CHIEF COMPLAINT:  ? ?Acute on chronic hypoxemic respiratory failure ? ? ?HISTORY OF PRESENT ILLNESS  ? ?This is a 66 yo F with PMH as below with significant hx of advanced COPD and chronic hypoxemia on supplemental O2 at home 2l/minnc wth CKD/CHF overlap, hx of breast and lung ca s/p SBRT, MDD, HTD, dyslipidemia, AF on eliquis who came in with DOE and worsening sob at rest.  She had Cdiff infection recently. Patient does have stable vital signs but is with significant abnormalities on labwork including markedly elevated BNP, and abronmal CT chest with compressive atelectasis of right lung with sorrounding moderate pleural effusion.  ? ?01/28/22- pt s/p thora, with 900cc amber fluid, she feels improved ? ?PAST MEDICAL HISTORY  ? ?Past Medical History:  ?Diagnosis Date  ? Anemia   ? Anxiety   ? Aortic atherosclerosis ([place])   ? Aortic valve stenosis 02/10/2018  ? a.) TTE 02/10/2018: EF 55-60%: mild AS with MPG of 12 mmHg. b.) TTE 04/21/2018: EF 35-40%; mild AS with MPG 8 mmHg. c.) TTE 11/07/2019: EF 50-55%; mild AS with MPG 10 mmHg.  ? Arthritis   ? Atrial fibrillation ([place])   ? a.) CHA2DS2-VASc = 4 (age, sex, HTN, aortic plaque). b.) Rate/rhythm maintained on oral carvedilol; chronically anticoagulated on dose reduced apixaban. c.)  Attempted deployment of LAA occlusive device on 01/11/2021; parameters failed and procedure aborted.  ? Breast cancer, left ([place]) 2000  ? a.) T2N1M0; ER/PR (+) --> Tx'd with total mastectomy, LN resection, XRT, and chemotherapy  ? Cancer of right lung ([place]) 07/30/2016  ? a.) adenocarcinoma; ALK, ROS1, PDL1, BRAF, EGFR all negative.  ? Chronic diastolic CHF (congestive heart failure) ([place]) 11/26/2021  ? CKD (chronic kidney disease), stage IV ([place])   ? COPD (chronic obstructive  pulmonary disease) ([place])   ? Dependence on supplemental oxygen   ? Depression   ? Diastolic dysfunction 39/12/90  ? a.) TTE 02/10/2017: EF 55-60%; G2DD. b.) TTE 04/21/2018: EF 35-40%; mild LA dilation; mod MV regurgitation. c.) TTE 11/07/2019: EF 50-55%; G1DD.  ? DOE (dyspnea on exertion)   ? GERD (gastroesophageal reflux disease)   ? Heart murmur   ? History of 2019 novel coronavirus disease (COVID-19) 10/14/2019  ? HLD (hyperlipidemia)   ? Hypertension   ? Long term current use of anticoagulant   ? a.) apixaban  ? Lymphedema   ? Personal history of chemotherapy   ? Personal history of radiation therapy   ? SBO (small bowel obstruction) ([place]) 11/05/2020  ? Vitamin D deficiency   ? ? ? ?SURGICAL HISTORY  ? ?Past Surgical History:  ?Procedure Laterality Date  ? Breast Biospy Left   ? [place]  ? BREAST SURGERY    ? COLONOSCOPY N/A 04/30/2018  ? Procedure: COLONOSCOPY;  Surgeon: Virgel Manifold, MD;  [facility] END[facility]COPY;  Service: Endoscopy;  Laterality: N/A;  ? COLONOSCOPY N/A 07/22/2018  ? Procedure: COLONOSCOPY;  Surgeon: Virgel Manifold, MD;  Location[facility] ENDOSCOPY;  Service: Endoscopy;  Laterality: N/A;  ? COLONOSCOPY WITH PROPOFOL N/A 09/21/2021  ? Procedure: COLONOSCOPY WITH PROPOFOL;  Surgeon: Benjamine Sprague, DO;  Location: [facility] ENDOSCOPY;  Service: General;  Laterality: N/A;  ?  DILATION AND CURETTAGE OF UTERUS    ? ELECTROMAGNETIC NAVIGATION BROCHOSCOPY Right 04/11/2016  ? Procedure: ELECTROMAGNETIC NAVIGATION BRONCHOSCOPY;  Surgeon: Vilinda Boehringer, MD;  Location: [facility] ORS;  Service: Cardiopulmonary;  Laterality: Right;  ? ESOPHAGOGASTRODUODENOSCOPY N/A 07/22/2018  ? Procedure: ESOPHAGOGASTRODUODENOSCOPY (EGD);  Surgeon: Virgel Manifold, MD;  Location: [facility] ENDOSCOPY;  Service: Endoscopy;  Laterality: N/A;  ? ESOPHAGOGASTRODUODENOSCOPY (EGD) WITH PROPOFOL N/A 05/07/2018  ? Procedure: ESOPHAGOGASTRODUODENOSCOPY (EGD) WITH PROPOFOL;  Surgeon: Lucilla Lame, MD;  Location: [facility] ENDOSCOPY;   Service: Endoscopy;  Laterality: N/A;  ? ESOPHAGOGASTRODUODENOSCOPY (EGD) WITH PROPOFOL N/A 04/24/2019  ? Procedure: ESOPHAGOGASTRODUODENOSCOPY (EGD) WITH PROPOFOL;  Surgeon: Jonathon Bellows, MD;  Location: [facility] ENDOSCOPY;  Service: Gastroenterology;  Laterality: N/A;  ? ESOPHAGOGASTRODUODENOSCOPY (EGD) WITH PROPOFOL N/A 01/12/2020  ? Procedure: ESOPHAGOGASTRODUODENOSCOPY (EGD) WITH PROPOFOL;  Surgeon: Jonathon Bellows, MD;  Location: [facility] ENDOSCOPY;  Service: Gastroenterology;  Laterality: N/A;  ? ESOPHAGOGASTRODUODENOSCOPY (EGD) WITH PROPOFOL N/A 04/28/2020  ? Procedure: ESOPHAGOGASTRODUODENOSCOPY (EGD) WITH PROPOFOL;  Surgeon: Jonathon Bellows, MD;  Location: [facility] ENDOSCOPY;  Service: Gastroenterology;  Laterality: N/A;  ? EUS N/A 05/07/2019  ? Procedure: FULL UPPER ENDOSCOPIC ULTRASOUND (EUS) RADIAL;  Surgeon: Jola Schmidt, MD;  Location: [facility] ENDOSCOPY;  Service: Endoscopy;  Laterality: N/A;  ? ILEOSCOPY N/A 07/22/2018  ? Procedure: ILEOSCOPY THROUGH STOMA;  Surgeon: Virgel Manifold, MD;  Location: [facility] ENDOSCOPY;  Service: Endoscopy;  Laterality: N/A;  ? ILEOSTOMY    ? ILEOSTOMY N/A 09/08/2018  ? Procedure: ILEOSTOMY REVISION POSSIBLE CREATION;  Surgeon: Herbert Pun, MD;  Location: [facility] ORS;  Service: General;  Laterality: N/A;  ? ILEOSTOMY CLOSURE N/A 08/15/2018  ? Procedure: DILATION OF ILEOSTOMY STRICTURE;  Surgeon: Herbert Pun, MD;  Location: [facility] ORS;  Service: General;  Laterality: N/A;  ? LAPAROTOMY Right 05/04/2018  ? Procedure: EXPLORATORY LAPAROTOMY right colectomy right and left ostomy;  Surgeon: Herbert Pun, MD;  Location: [facility] ORS;  Service: General;  Laterality: Right;  ? LEFT ATRIAL APPENDAGE OCCLUSION N/A 01/11/2021  ? Procedure: LEFT ATRIAL APPENDAGE OCCLUSION ([place]); ABORTED PROCEDURE WITHOUT DEVICE BEING IMPLANTED; Location: [facility]; Surgeon: Mylinda Latina, MD  ? LUNG BIOPSY    ? MASTECTOMY Left   ? 2000, [facility]  ? ROTATOR CUFF REPAIR Right   ? [place]  ? XI ROBOTIC  ASSISTED COLOSTOMY TAKEDOWN N/A 10/23/2021  ? Procedure: XI ROBOTIC ASSISTED ILEOSTOMY TAKEDOWN;  Surgeon: Herbert Pun, MD;  Location: [facility] ORS;  Service: General;  Laterality: N/A;  180 minutes for the surgery part please  ? ? ? ?FAMILY HISTORY  ? ?Family History  ?Problem Relation Age of Onset  ? Breast cancer Mother 29  ? Cancer Mother   ?     Breast   ? Cirrhosis Father   ? Breast cancer Paternal Aunt 40  ? Cancer Maternal Aunt   ?     Breast   ? ? ? ?SOCIAL HISTORY  ? ?Social History  ? ?Tobacco Use  ? Smoking status: Former  ?  Packs/day: 0.50  ?  Years: 20.00  ?  Pack years: 10.00  ?  Types: Cigarettes  ?  Quit date: 07/02/2012  ?  Years since quitting: 9.5  ? Smokeless tobacco: Current  ?  Types: Snuff  ? Tobacco comments:  ?  quit 2014  ?Vaping Use  ? Vaping Use: Never used  ?Substance Use Topics  ? Alcohol use: Yes  ?  Comment: Occasionally beer  ? Drug use: No  ? ? ? ?MEDICATIONS  ? ? ?Home Medication:  ?  ?Current  Medication: ? ?Current Facility-Administered Medications:  ?  acetaminophen (TYLENOL) tablet 650 mg, 650 mg, Oral, Q6H PRN, 650 mg at 01/27/22 1644 **OR** acetaminophen (TYLENOL) suppository 650 mg, 650 mg, Rectal, Q6H PRN, Clarnce Flock, MD ?  amiodarone (PACERONE) tablet 200 mg, 200 mg, Oral, BID, Clarnce Flock, MD, 200 mg at 01/27/22 7371 ?  Chlorhexidine Gluconate Cloth 2 % PADS 6 each, 6 each, Topical, Daily, Clarnce Flock, MD ?  citalopram (CELEXA) tablet 40 mg, 40 mg, Oral, Daily, Clarnce Flock, MD, 40 mg at 01/27/22 0947 ?  diltiazem (CARDIZEM) 125 mg in dextrose 5% 125 mL (1 mg/mL) infusion, 5-15 mg/hr, Intravenous, Titrated, Andrez Grime, MD, Stopped at 01/27/22 2253 ?  furosemide (LASIX) injection 40 mg, 40 mg, Intravenous, Daily, Lanney Gins, Delbert Vu, MD, 40 mg at 01/27/22 0626 ?  furosemide (LASIX) injection 40 mg, 40 mg, Intravenous, BID, Manuella Ghazi, Vipul, MD ?  guaiFENesin-dextromethorphan (ROBITUSSIN DM) 100-10 MG/5ML syrup 5 mL, 5 mL, Oral, Q4H PRN,  Clarnce Flock, MD, 5 mL at 01/27/22 0553 ?  metoprolol tartrate (LOPRESSOR) tablet 12.5 mg, 12.5 mg, Oral, BID, Clarnce Flock, MD, 12.5 mg at 01/27/22 0948 ?  ondansetron (ZOFRAN) tablet 4 mg, 4 mg, Oral, Q

## 2022-01-28 NOTE — Assessment & Plan Note (Signed)
Cardiology input appreciated -consider resuming Eliquis if no further epistaxis by tomorrow.  Thoracentesis completed today ?Continue amiodarone, metoprolol.  Discontinue Cardizem drip per cardiology.  Rate much better controlled now ?

## 2022-01-28 NOTE — Progress Notes (Signed)
?   01/28/22 1835  ?Assess: MEWS Score  ?Temp 97.6 ?F (36.4 ?C)  ?BP 97/79  ?Pulse Rate (!) 117  ?Resp 16  ?SpO2 98 %  ?O2 Device Nasal Cannula  ?O2 Flow Rate (L/min) 3 L/min  ?Assess: MEWS Score  ?MEWS Temp 0  ?MEWS Systolic 1  ?MEWS Pulse 2  ?MEWS RR 0  ?MEWS LOC 0  ?MEWS Score 3  ?MEWS Score Color Yellow  ?Assess: if the MEWS score is Yellow or Red  ?Were vital signs taken at a resting state? Yes  ?Focused Assessment No change from prior assessment  ?Does the patient meet 2 or more of the SIRS criteria? No  ?MEWS guidelines implemented *See Row Information* No, previously yellow, continue vital signs every 4 hours  ?Escalate  ?MEWS: Escalate Yellow: discuss with charge nurse/RN and consider discussing with provider and RRT  ?Notify: Charge Nurse/RN  ?Name of Charge Nurse/RN Notified Amy RN  ?Date Charge Nurse/RN Notified 01/28/22  ?Time Charge Nurse/RN Notified 1840  ?Assess: SIRS CRITERIA  ?SIRS Temperature  0  ?SIRS Pulse 1  ?SIRS Respirations  0  ?SIRS WBC 0  ?SIRS Score Sum  1  ? ? ?

## 2022-01-28 NOTE — Procedures (Signed)
PROCEDURE SUMMARY: ? ?Successful US guided diagnostic and therapeutic right thoracentesis. ?Yielded 900 cc of hazy, amber fluid. ?Pt tolerated procedure well. ?No immediate complications. ? ?Specimen was sent for labs. ?CXR ordered. ? ?EBL < 1 mL ? ?Tyson Alias, AGNP ?01/28/2022 ?10:10 AM ? ?  ? ?

## 2022-01-28 NOTE — Assessment & Plan Note (Addendum)
Blood pressure is soft.  Continue amiodarone and metoprolol.  Cardiology following.  Cardizem on hold ?

## 2022-01-28 NOTE — Assessment & Plan Note (Signed)
No evidence of acute exacerbation at present, on 3 L oxygen.  Wean oxygen as able ?

## 2022-01-28 NOTE — Progress Notes (Signed)
[facility] ? ?CARDIOLOGY CONSULT NOTE  ?Patient ID: ?Danielle [name] ?MRN: [id number] ?DOB/AGE: 1955-10-30 66 y.o. ? ?Admit date: 01/26/2022 ?Referring Physician [name] ?Primary Physician Center, [facility] ?Primary Cardiologist [name] ?Reason for Consultation AF ? ?HPI:  ?Danielle [name] is a 66 year old female with history of advanced COPD on chronic oxygen (2 L), CKD (baseline creatinine 2-2.5), atrial fibrillation, mild aortic stenosis who was admitted with shortness of breath.  Cardiology is consulted for assistance with atrial fibrillation. ? ?Interval history: ?- Started on diltiazem infusion on 4/15, converted shortly thereafter out of atrial flutter to A fib.  ?- Thoracentesis completed this morning with removal of ~ 900 cc fluid. Some periods of RVR in the time after this.  ?- Feels better this morning. Denies significant chest pain or shortness of breath.  ? ?Review of systems complete and found to be negative unless listed above  ? ? ? ?Past Medical History:  ?Diagnosis Date  ? Anemia   ? Anxiety   ? Aortic atherosclerosis ([place])   ? Aortic valve stenosis 02/10/2018  ? a.) TTE 02/10/2018: EF 55-60%: mild AS with MPG of 12 mmHg. b.) TTE 04/21/2018: EF 35-40%; mild AS with MPG 8 mmHg. c.) TTE 11/07/2019: EF 50-55%; mild AS with MPG 10 mmHg.  ? Arthritis   ? Atrial fibrillation ([place])   ? a.) CHA2DS2-VASc = 4 (age, sex, HTN, aortic plaque). b.) Rate/rhythm maintained on oral carvedilol; chronically anticoagulated on dose reduced apixaban. c.)  Attempted deployment of LAA occlusive device on 01/11/2021; parameters failed and procedure aborted.  ? Breast cancer, left ([place]) 2000  ? a.) T2N1M0; ER/PR (+) --> Tx'd with total mastectomy, LN resection, XRT, and chemotherapy  ? Cancer of right lung ([place]) 07/30/2016  ? a.) adenocarcinoma; ALK, ROS1, PDL1, BRAF, EGFR all negative.  ? Chronic diastolic CHF (congestive heart failure) ([place]) 11/26/2021  ? CKD (chronic kidney disease), stage IV ([place])   ? COPD  (chronic obstructive pulmonary disease) ([place])   ? Dependence on supplemental oxygen   ? Depression   ? Diastolic dysfunction 40/97/3532  ? a.) TTE 02/10/2017: EF 55-60%; G2DD. b.) TTE 04/21/2018: EF 35-40%; mild LA dilation; mod MV regurgitation. c.) TTE 11/07/2019: EF 50-55%; G1DD.  ? DOE (dyspnea on exertion)   ? GERD (gastroesophageal reflux disease)   ? Heart murmur   ? History of 2019 novel coronavirus disease (COVID-19) 10/14/2019  ? HLD (hyperlipidemia)   ? Hypertension   ? Long term current use of anticoagulant   ? a.) apixaban  ? Lymphedema   ? Personal history of chemotherapy   ? Personal history of radiation therapy   ? SBO (small bowel obstruction) ([place]) 11/05/2020  ? Vitamin D deficiency   ?  ?Past Surgical History:  ?Procedure Laterality Date  ? Breast Biospy Left   ? [place]  ? BREAST SURGERY    ? COLONOSCOPY N/A 04/30/2018  ? Procedure: COLONOSCOPY;  Surgeon: Virgel Manifold, MD;  Location: [facility] ENDOSCOPY;  Service: Endoscopy;  Laterality: N/A;  ? COLONOSCOPY N/A 07/22/2018  ? Procedure: COLONOSCOPY;  Surgeon: Virgel Manifold, MD;  Location: [facility] ENDOSCOPY;  Service: Endoscopy;  Laterality: N/A;  ? COLONOSCOPY WITH PROPOFOL N/A 09/21/2021  ? Procedure: COLONOSCOPY WITH PROPOFOL;  Surgeon: Benjamine Sprague, DO;  Location: [facility] ENDOSCOPY;  Service: General;  Laterality: N/A;  ? DILATION AND CURETTAGE OF UTERUS    ? ELECTROMAGNETIC NAVIGATION BROCHOSCOPY Right 04/11/2016  ? Procedure: ELECTROMAGNETIC NAVIGATION BRONCHOSCOPY;  Surgeon: Vilinda Boehringer, MD;  Location: [facility] ORS;  Service:  Cardiopulmonary;  Laterality: Right;  ? ESOPHAGOGASTRODUODENOSCOPY N/A 07/22/2018  ? Procedure: ESOPHAGOGASTRODUODENOSCOPY (EGD);  Surgeon: Virgel Manifold, MD;  Location: [facility] ENDOSCOPY;  Service: Endoscopy;  Laterality: N/A;  ? ESOPHAGOGASTRODUODENOSCOPY (EGD) WITH PROPOFOL N/A 05/07/2018  ? Procedure: ESOPHAGOGASTRODUODENOSCOPY (EGD) WITH PROPOFOL;  Surgeon: Lucilla Lame, MD;  Location: [facility] ENDOSCOPY;  Service:  Endoscopy;  Laterality: N/A;  ? ESOPHAGOGASTRODUODENOSCOPY (EGD) WITH PROPOFOL N/A 04/24/2019  ? Procedure: ESOPHAGOGASTRODUODENOSCOPY (EGD) WITH PROPOFOL;  Surgeon: Jonathon Bellows, MD;  Location: [facility] ENDOSCOPY;  Service: Gastroenterology;  Laterality: N/A;  ? ESOPHAGOGASTRODUODENOSCOPY (EGD) WITH PROPOFOL N/A 01/12/2020  ? Procedure: ESOPHAGOGASTRODUODENOSCOPY (EGD) WITH PROPOFOL;  Surgeon: Jonathon Bellows, MD;  Location: [facility] ENDOSCOPY;  Service: Gastroenterology;  Laterality: N/A;  ? ESOPHAGOGASTRODUODENOSCOPY (EGD) WITH PROPOFOL N/A 04/28/2020  ? Procedure: ESOPHAGOGASTRODUODENOSCOPY (EGD) WITH PROPOFOL;  Surgeon: Jonathon Bellows, MD;  Location: [facility] ENDOSCOPY;  Service: Gastroenterology;  Laterality: N/A;  ? EUS N/A 05/07/2019  ? Procedure: FULL UPPER ENDOSCOPIC ULTRASOUND (EUS) RADIAL;  Surgeon: Jola Schmidt, MD;  Location: [facility] ENDOSCOPY;  Service: Endoscopy;  Laterality: N/A;  ? ILEOSCOPY N/A 07/22/2018  ? Procedure: ILEOSCOPY THROUGH STOMA;  Surgeon: Virgel Manifold, MD;  Location: [facility] ENDOSCOPY;  Service: Endoscopy;  Laterality: N/A;  ? ILEOSTOMY    ? ILEOSTOMY N/A 09/08/2018  ? Procedure: ILEOSTOMY REVISION POSSIBLE CREATION;  Surgeon: Herbert Pun, MD;  Location: [facility] ORS;  Service: General;  Laterality: N/A;  ? ILEOSTOMY CLOSURE N/A 08/15/2018  ? Procedure: DILATION OF ILEOSTOMY STRICTURE;  Surgeon: Herbert Pun, MD;  Location: [facility] ORS;  Service: General;  Laterality: N/A;  ? LAPAROTOMY Right 05/04/2018  ? Procedure: EXPLORATORY LAPAROTOMY right colectomy right and left ostomy;  Surgeon: Herbert Pun, MD;  Location: [facility] ORS;  Service: General;  Laterality: Right;  ? LEFT ATRIAL APPENDAGE OCCLUSION N/A 01/11/2021  ? Procedure: LEFT ATRIAL APPENDAGE OCCLUSION ([place]); ABORTED PROCEDURE WITHOUT DEVICE BEING IMPLANTED; Location: [facility]; Surgeon: Mylinda Latina, MD  ? LUNG BIOPSY    ? MASTECTOMY Left   ? 2000, [facility]  ? ROTATOR CUFF REPAIR Right   ? [place]  ? XI ROBOTIC ASSISTED  COLOSTOMY TAKEDOWN N/A 10/23/2021  ? Procedure: XI ROBOTIC ASSISTED ILEOSTOMY TAKEDOWN;  Surgeon: Herbert Pun, MD;  Location: [facility] ORS;  Service: General;  Laterality: N/A;  180 minutes for the surgery part please  ?  ?Medications Prior to Admission  ?Medication Sig Dispense Refill Last Dose  ? amiodarone (PACERONE) 200 MG tablet Take 1 tablet (200 mg total) by mouth 2 (two) times daily. 60 tablet 0 01/25/2022 at 2200  ? apixaban (ELIQUIS) 2.5 MG TABS tablet Take 2.5 mg by mouth 2 (two) times daily.   01/25/2022 at 2200  ? citalopram (CELEXA) 40 MG tablet Take 40 mg by mouth every morning.   01/25/2022 at 0900  ? Fluticasone-Umeclidin-Vilant 100-62.5-25 MCG/INH AEPB Inhale 1 puff into the lungs every morning.   01/25/2022 at 0900  ? metoprolol tartrate (LOPRESSOR) 25 MG tablet Take 0.5 tablets (12.5 mg total) by mouth 2 (two) times daily. 60 tablet 0 01/25/2022 at 2200  ? pantoprazole (PROTONIX) 40 MG tablet Take 1 tablet (40 mg total) by mouth 2 (two) times daily before a meal. 60 tablet 2 01/25/2022 at 1700  ? acetaminophen (TYLENOL) 325 MG tablet Take 2 tablets (650 mg total) by mouth every 6 (six) hours as needed for mild pain (or Fever >/= 101). (Patient taking differently: Take 650 mg by mouth every 4 (four) hours as needed for moderate pain.)   prn at unknown  ? albuterol (PROVENTIL  HFA;VENTOLIN HFA) 108 (90 Base) MCG/ACT inhaler Inhale 2 puffs into the lungs every 6 (six) hours as needed for wheezing or shortness of breath. 1 Inhaler 2 prn at unknown  ? calcium carbonate (TUMS - DOSED IN MG ELEMENTAL CALCIUM) 500 MG chewable tablet Chew 1 tablet by mouth daily.   prn at unknown  ? DILT-XR 240 MG 24 hr capsule Take 240 mg by mouth daily. (Patient not taking: Reported on 01/26/2022)   Not Taking  ? fidaxomicin (DIFICID) 200 MG TABS tablet Take 1 tablet (200 mg total) by mouth 2 (two) times daily. (Patient not taking: Reported on 01/26/2022) 15 tablet 0 Not Taking  ? midodrine (PROAMATINE) 5 MG tablet Take 1  tablet (5 mg total) by mouth 3 (three) times daily with meals. 60 tablet 0 prn at unknown  ? OXYGEN Inhale 2 L into the lungs as needed (shortness of breath).     ? sucralfate (CARAFATE) 1 GM/10ML susp

## 2022-01-28 NOTE — Assessment & Plan Note (Signed)
Resolved now.  ENT input appreciated.  Hold Eliquis ?

## 2022-01-28 NOTE — Assessment & Plan Note (Signed)
Followed by Dr. Rogue Bussing ?

## 2022-01-28 NOTE — Progress Notes (Addendum)
?   01/28/22 0000  ?Vitals  ?BP 90/69  ?MAP (mmHg) 77  ?BP Location Right Arm  ?BP Method Automatic  ?Patient Position (if appropriate) Lying  ?Pulse Rate (!) 58  ?Pulse Rate Source Monitor  ?ECG Heart Rate (!) 59  ?Resp 14  ? ?Cardizem discontinued @ 2300 per protocol. Rate sustaining around 50's afib/aflutter. Pt. Is asymptomatic. Scheduled Metoprolol 12.5 mg tab and Amiodarone 200 mg tablet not given. NP is aware. ? ?0500 No urine output overnight; Bladder soft, non tender; pt. Denies the need to urinate. Bladder scan revealed only 18 ml.  ?

## 2022-01-28 NOTE — Assessment & Plan Note (Signed)
Nephrology following.  Continue IV Lasix 40 mg daily for now per nephrology team ?

## 2022-01-28 NOTE — Assessment & Plan Note (Signed)
Depression-continue citalopram ?GERD-continue PPI ?Hypertension-hold midodrine ?

## 2022-01-29 ENCOUNTER — Inpatient Hospital Stay: Payer: Medicare Other

## 2022-01-29 DIAGNOSIS — I4891 Unspecified atrial fibrillation: Secondary | ICD-10-CM | POA: Diagnosis not present

## 2022-01-29 DIAGNOSIS — G459 Transient cerebral ischemic attack, unspecified: Secondary | ICD-10-CM

## 2022-01-29 DIAGNOSIS — N184 Chronic kidney disease, stage 4 (severe): Secondary | ICD-10-CM | POA: Diagnosis not present

## 2022-01-29 DIAGNOSIS — I484 Atypical atrial flutter: Secondary | ICD-10-CM | POA: Diagnosis not present

## 2022-01-29 DIAGNOSIS — I5022 Chronic systolic (congestive) heart failure: Secondary | ICD-10-CM | POA: Diagnosis not present

## 2022-01-29 DIAGNOSIS — J90 Pleural effusion, not elsewhere classified: Secondary | ICD-10-CM | POA: Diagnosis not present

## 2022-01-29 LAB — BASIC METABOLIC PANEL
Anion gap: 7 (ref 5–15)
BUN: 53 mg/dL — ABNORMAL HIGH (ref 8–23)
CO2: 24 mmol/L (ref 22–32)
Calcium: 8.5 mg/dL — ABNORMAL LOW (ref 8.9–10.3)
Chloride: 104 mmol/L (ref 98–111)
Creatinine, Ser: 2.9 mg/dL — ABNORMAL HIGH (ref 0.44–1.00)
GFR, Estimated: 17 mL/min — ABNORMAL LOW (ref >60)
Glucose, Bld: 113 mg/dL — ABNORMAL HIGH (ref 70–99)
Potassium: 4.1 mmol/L (ref 3.5–5.1)
Sodium: 135 mmol/L (ref 135–145)

## 2022-01-29 LAB — CBC
HCT: 29.1 % — ABNORMAL LOW (ref 36.0–46.0)
Hemoglobin: 9.6 g/dL — ABNORMAL LOW (ref 12.0–15.0)
MCH: 31.1 pg (ref 26.0–34.0)
MCHC: 33 g/dL (ref 30.0–36.0)
MCV: 94.2 fL (ref 80.0–100.0)
Platelets: 231 10*3/uL (ref 150–400)
RBC: 3.09 MIL/uL — ABNORMAL LOW (ref 3.87–5.11)
RDW: 16.7 % — ABNORMAL HIGH (ref 11.5–15.5)
WBC: 7.8 10*3/uL (ref 4.0–10.5)
nRBC: 0.5 % — ABNORMAL HIGH (ref 0.0–0.2)

## 2022-01-29 LAB — GLUCOSE, CAPILLARY: Glucose-Capillary: 142 mg/dL — ABNORMAL HIGH (ref 70–99)

## 2022-01-29 LAB — MAGNESIUM: Magnesium: 1.9 mg/dL (ref 1.7–2.4)

## 2022-01-29 LAB — PHOSPHORUS: Phosphorus: 4.1 mg/dL (ref 2.5–4.6)

## 2022-01-29 MED ORDER — APIXABAN 2.5 MG PO TABS
2.5000 mg | ORAL_TABLET | Freq: Two times a day (BID) | ORAL | Status: DC
  Administered 2022-01-29 – 2022-02-04 (×13): 2.5 mg via ORAL
  Filled 2022-01-29 (×13): qty 1

## 2022-01-29 MED ORDER — ADULT MULTIVITAMIN W/MINERALS CH
1.0000 | ORAL_TABLET | Freq: Every day | ORAL | Status: DC
  Administered 2022-01-29 – 2022-02-04 (×7): 1 via ORAL
  Filled 2022-01-29 (×7): qty 1

## 2022-01-29 MED ORDER — NEPRO/CARBSTEADY PO LIQD
237.0000 mL | Freq: Three times a day (TID) | ORAL | Status: DC
Start: 2022-01-29 — End: 2022-02-04
  Administered 2022-01-29 – 2022-02-03 (×5): 237 mL via ORAL

## 2022-01-29 MED ORDER — ATORVASTATIN CALCIUM 20 MG PO TABS
40.0000 mg | ORAL_TABLET | Freq: Every day | ORAL | Status: DC
  Administered 2022-01-29 – 2022-02-03 (×6): 40 mg via ORAL
  Filled 2022-01-29 (×6): qty 2

## 2022-01-29 MED ORDER — CITALOPRAM HYDROBROMIDE 20 MG PO TABS
20.0000 mg | ORAL_TABLET | Freq: Every day | ORAL | Status: DC
Start: 2022-01-30 — End: 2022-02-04
  Administered 2022-01-30 – 2022-02-04 (×6): 20 mg via ORAL
  Filled 2022-01-29 (×6): qty 1

## 2022-01-29 NOTE — Progress Notes (Signed)
Chaplain Maggie responded to code stroke. Pt was being attended to by medical team. Continued care available per on call chaplain. ?

## 2022-01-29 NOTE — Progress Notes (Signed)
?[facility]  ?ROUNDING NOTE  ? ?Subjective:  ? ?Patient resting comfortably ?Breakfast tray at bedside, tolerating meals ?Remains on 3L [place] ? ?Creatinine 2.90 from 2.63 ? ?Objective:  ?Vital signs in last 24 hours:  ?Temp:  [97.6 ?F (36.4 ?C)-98.9 ?F (37.2 ?C)] 98.3 ?F (36.8 ?C) (04/17 1320) ?Pulse Rate:  [58-121] 58 (04/17 1320) ?Resp:  [12-23] 16 (04/17 1320) ?BP: (83-122)/(62-89) 95/80 (04/17 1320) ?SpO2:  [91 %-100 %] 99 % (04/17 1320) ? ?Weight change:  ?Filed Weights  ? 01/26/22 1830  ?Weight: 53.7 kg  ? ? ?Intake/Output: ?I/O last 3 completed shifts: ?In: 454.6 [P.O.:420; I.V.:34.6] ?Out: 84 [Urine:75] ?  ?Intake/Output this shift: ? Total I/O ?In: 240 [P.O.:240] ?Out: -  ? ?Physical Exam: ?General: NAD, resting comfortably  ?Head: Normocephalic, atraumatic. Moist oral mucosal membranes  ?Eyes: Anicteric  ?Lungs:  Crackles, normal effort  ?Heart: Irregular rate and rhythm  ?Abdomen:  Soft, nontender,   ?Extremities:  no peripheral edema.  ?Neurologic: Nonfocal, moving all four extremities  ?Skin: No lesions  ?Access: none  ? ? ?Basic Metabolic Panel: ?Recent Labs  ?Lab 01/26/22 ?1312 01/27/22 ?0429 01/28/22 ?0352 01/29/22 ?5456  ?NA 140 138 137 135  ?K 4.2 4.3 4.0 4.1  ?CL 104 105 104 104  ?CO2 24 23 26 24   ?GLUCOSE 96 81 109* 113*  ?BUN 32* 37* 52* 53*  ?CREATININE 1.91* 2.11* 2.63* 2.90*  ?CALCIUM 9.4 8.8* 8.5* 8.5*  ?MG  --   --  1.5* 1.9  ?PHOS  --   --  3.8 4.1  ? ? ?Liver Function Tests: ?No results for input(s): AST, ALT, ALKPHOS, BILITOT, PROT, ALBUMIN in the last 168 hours. ?No results for input(s): LIPASE, AMYLASE in the last 168 hours. ?No results for input(s): AMMONIA in the last 168 hours. ? ?CBC: ?Recent Labs  ?Lab 01/26/22 ?1312 01/27/22 ?0429 01/28/22 ?0352 01/29/22 ?2563  ?WBC 6.8 5.5 7.2 7.8  ?NEUTROABS 4.1  --   --   --   ?HGB 10.6* 10.4* 9.0* 9.6*  ?HCT 32.9* 31.5* 28.7* 29.1*  ?MCV 92.7 92.9 95.3 94.2  ?PLT 227 201 206 231  ? ? ?Cardiac Enzymes: ?No results for input(s):  CKTOTAL, CKMB, CKMBINDEX, TROPONINI in the last 168 hours. ? ?BNP: ?Invalid input(s): POCBNP ? ?CBG: ?Recent Labs  ?Lab 01/26/22 ?1823 01/29/22 ?8937  ?GLUCAP 126* 142*  ? ? ?Microbiology: ?Results for orders placed or performed during the hospital encounter of 01/26/22  ?Resp Panel by RT-PCR (Flu A&B, Covid) Nasopharyngeal Swab     Status: None  ? Collection Time: 01/26/22  1:32 PM  ? Specimen: Nasopharyngeal Swab; Nasopharyngeal(NP) swabs in vial transport medium  ?Result Value Ref Range Status  ? SARS Coronavirus 2 by RT PCR NEGATIVE NEGATIVE Final  ?  Comment: (NOTE) ?SARS-CoV-2 target nucleic acids are NOT DETECTED. ? ?The SARS-CoV-2 RNA is generally detectable in upper respiratory ?specimens during the acute phase of infection. The lowest ?concentration of SARS-CoV-2 viral copies this assay can detect is ?138 copies/mL. A negative result does not preclude SARS-Cov-2 ?infection and should not be used as the sole basis for treatment or ?other patient management decisions. A negative result may occur with  ?improper specimen collection/handling, submission of specimen other ?than nasopharyngeal swab, presence of viral mutation(s) within the ?areas targeted by this assay, and inadequate number of viral ?copies(<138 copies/mL). A negative result must be combined with ?clinical observations, patient history, and epidemiological ?information. The expected result is Negative. ? ?Fact Sheet for Patients:  ?EntrepreneurPulse.com.au ? ?  Fact Sheet for Healthcare Providers:  ?IncredibleEmployment.be ? ?This test is no t yet approved or cleared by the [place] FDA and  ?has been authorized for detection and/or diagnosis of SARS-CoV-2 by ?FDA under an Emergency Use Authorization (EUA). This EUA will remain  ?in effect (meaning this test can be used) for the duration of the ?COVID-19 declaration under Section 564(b)(1) of the Act, 21 ?U.S.C.section 360bbb-3(b)(1), unless the  authorization is terminated  ?or revoked sooner.  ? ? ?  ? Influenza A by PCR NEGATIVE NEGATIVE Final  ? Influenza B by PCR NEGATIVE NEGATIVE Final  ?  Comment: (NOTE) ?The Xpert Xpress SARS-CoV-2/FLU/RSV plus assay is intended as an aid ?in the diagnosis of influenza from Nasopharyngeal swab specimens and ?should not be used as a sole basis for treatment. Nasal washings and ?aspirates are unacceptable for Xpert Xpress SARS-CoV-2/FLU/RSV ?testing. ? ?Fact Sheet for Patients: ?EntrepreneurPulse.com.au ? ?Fact Sheet for Healthcare Providers: ?IncredibleEmployment.be ? ?This test is not yet approved or cleared by the [place] FDA and ?has been authorized for detection and/or diagnosis of SARS-CoV-2 by ?FDA under an Emergency Use Authorization (EUA). This EUA will remain ?in effect (meaning this test can be used) for the duration of the ?COVID-19 declaration under Section 564(b)(1) of the Act, 21 U.S.C. ?section 360bbb-3(b)(1), unless the authorization is terminated or ?revoked. ? ?Performed at [facility], [place], ?[place] [address] ?  ?MRSA Next Gen by PCR, Nasal     Status: None  ? Collection Time: 01/26/22  6:26 PM  ? Specimen: Nasal Mucosa; Nasal Swab  ?Result Value Ref Range Status  ? MRSA by PCR Next Gen NOT DETECTED NOT DETECTED Final  ?  Comment: (NOTE) ?The GeneXpert MRSA Assay (FDA approved for NASAL specimens only), ?is one component of a comprehensive MRSA colonization surveillance ?program. It is not intended to diagnose MRSA infection nor to guide ?or monitor treatment for MRSA infections. ?Test performance is not FDA approved in patients less than 2 years ?old. ?Performed at [facility], [place], ?[place] [address] ?  ?Body fluid culture w Gram Stain     Status: None (Preliminary result)  ? Collection Time: 01/28/22  9:40 AM  ? Specimen: PATH Cytology Pleural fluid  ?Result Value Ref Range Status  ? Specimen  Description   Final  ?  PLEURAL ?Performed at [facility], [address] [place], [place] [address] ?  ? Special Requests   Final  ?  NONE ?Performed at [facility], [address], [place], [place] [address] ?  ? Gram Stain   Final  ?  RARE WBC PRESENT,BOTH PMN AND MONONUCLEAR ?NO ORGANISMS SEEN ?  ? Culture   Final  ?  NO GROWTH < 24 HOURS ?Performed at [facility], [place] [address] [place], [place] [address] ?  ? Report Status PENDING  Incomplete  ? ? ?Coagulation Studies: ?Recent Labs  ?  01/27/22 ?0429  ?LABPROT 15.6*  ?INR 1.3*  ? ? ?Urinalysis: ?No results for input(s): COLORURINE, LABSPEC, [place], GLUCOSEU, HGBUR, BILIRUBINUR, KETONESUR, PROTEINUR, UROBILINOGEN, NITRITE, LEUKOCYTESUR in the last 72 hours. ? ?Invalid input(s): APPERANCEUR  ? ? ?Imaging: ?DG Chest Port 1 View ? ?Result Date: 01/28/2022 ?CLINICAL DATA:  Status post right thoracentesis. EXAM: PORTABLE CHEST 1 VIEW COMPARISON:  01/26/2022 and older studies. FINDINGS: Significant decrease in right pleural fluid following thoracentesis. No pneumothorax. Elongated opacity in the right upper lobe is stable. Opacity in the medial left  upper lobe is stable. Prominent interstitial markings bilaterally are also unchanged. No new lung abnormalities. IMPRESSION: 1. Significant decrease in right pleural fluid following thoracentesis. No pneumothorax. No other change. Electronically Signed   By: Lajean Manes M.D.   On: 01/28/2022 10:23  ? ?CT HEAD CODE STROKE WO CONTRAST` ? ?Result Date: 01/29/2022 ?CLINICAL DATA:  Code stroke.  Neuro deficit, acute, stroke suspected EXAM: CT HEAD WITHOUT CONTRAST TECHNIQUE: Contiguous axial images were obtained from the base of the skull through the vertex without intravenous contrast. RADIATION DOSE REDUCTION: This exam was performed according to the departmental dose-optimization program which includes automated exposure control, adjustment of the mA and/or kV according to patient size and/or  use of iterative reconstruction technique. COMPARISON:  11/16/2020 FINDINGS: Brain: There is no acute intracranial hemorrhage, mass effect, or edema. Gray-white differentiation is preserved. Patchy hypoattenuation in

## 2022-01-29 NOTE — Assessment & Plan Note (Addendum)
S/p US guided thoracentesis on 4/16 with 900 mL of fluid removal.  Labs sent ?Considering her worsening kidney failure we will hold off on Lasix ?

## 2022-01-29 NOTE — Evaluation (Signed)
Occupational Therapy Evaluation ?Patient Details ?Name: Danielle [name] ?MRN: [id number] ?DOB: 09-23-1956 ?Today's Date: 01/29/2022 ? ? ?History of Present Illness 66 y.o.  female with medical problems of advanced COPD on chronic oxygen, chronic kidney disease, atrial fibrillation, mild aortic stenosis, history of lung cancer, history of esophageal cancer, MGUS, hyperlipidemia was admitted for shortness of breath was admitted on 01/26/2022 for Pleural effusion.  ? ?Clinical Impression ?  ?Patient presenting with decreased Ind in self care, balance, functional mobility/transfers, endurance, and safety awareness. Patient reports living with son and granddaughter who are available to assist as needed as well as a neighbor.  Patient reports being independent at baseline. She reports sharing IADL tasks with family. Se does not drive but is able to use public transportation or family assists. This session pt performed bed mobility, short distance ambulation, and functional transfers with close supervision without use of AD. Pt's HR increased into 120's with activity. She is on 2Ls O2 via [place] secondary to feeling SOB. Patient will benefit from acute OT to increase overall independence in the areas of ADLs, functional mobility, and safety awareness in order to safely discharge home with family.  ?   ? ?Recommendations for follow up therapy are one component of a multi-disciplinary discharge planning process, led by the attending physician.  Recommendations may be updated based on patient status, additional functional criteria and insurance authorization.  ? ?Follow Up Recommendations ? Home health OT  ?  ?Assistance Recommended at Discharge Intermittent Supervision/Assistance  ?Patient can return home with the following A little help with walking and/or transfers;A little help with bathing/dressing/bathroom;Direct supervision/assist for medications management;Direct supervision/assist for financial management;Help with stairs or  ramp for entrance;Assist for transportation;Assistance with cooking/housework ? ?  ?Functional Status Assessment ? Patient has had a recent decline in their functional status and demonstrates the ability to make significant improvements in function in a reasonable and predictable amount of time.  ?Equipment Recommendations ? None recommended by OT  ?  ?   ?Precautions / Restrictions Precautions ?Precautions: Fall  ? ?  ? ?Mobility Bed Mobility ?Overal bed mobility: Modified Independent ?  ?  ?  ?  ?  ?  ?General bed mobility comments: no physical assistance ?  ? ?Transfers ?Overall transfer level: Needs assistance ?Equipment used: None ?Transfers: Sit to/from Stand, Bed to chair/wheelchair/BSC ?Sit to Stand: Supervision ?  ?  ?Step pivot transfers: Supervision ?  ?  ?  ?  ? ?  ?Balance Overall balance assessment: Needs assistance ?Sitting-balance support: Feet supported ?Sitting balance-Leahy Scale: Good ?  ?  ?Standing balance support: During functional activity ?Standing balance-Leahy Scale: Fair ?  ?  ?  ?  ?  ?  ?  ?  ?  ?  ?  ?  ?   ? ?ADL either performed or assessed with clinical judgement  ? ?ADL Overall ADL's : Needs assistance/impaired ?  ?  ?  ?  ?  ?  ?  ?  ?  ?  ?  ?  ?  ?  ?Toileting- Clothing Manipulation and Hygiene: Supervision/safety;Sit to/from stand ?  ?  ?  ?Functional mobility during ADLs: Supervision/safety ?   ? ? ? ?Vision Patient Visual Report: No change from baseline ?   ?   ?   ?   ? ?Pertinent Vitals/Pain Pain Assessment ?Pain Assessment: 0-10 ?Pain Score: 2  ?Pain Location: abdomen ?Pain Descriptors / Indicators: Aching, Discomfort ?Pain Intervention(s): Limited activity within patient's tolerance, Monitored during session, Repositioned  ? ? ? ?  Hand Dominance Right ?  ?Extremity/Trunk Assessment Upper Extremity Assessment ?Upper Extremity Assessment: Overall WFL for tasks assessed;Generalized weakness ?  ?Lower Extremity Assessment ?Lower Extremity Assessment: Overall WFL for tasks  assessed;Generalized weakness ?  ?  ?  ?Communication Communication ?Communication: No difficulties ?  ?Cognition Arousal/Alertness: Awake/alert ?Behavior During Therapy: [facility] for tasks assessed/performed ?Overall Cognitive Status: Within Functional Limits for tasks assessed ?  ?  ?  ?  ?  ?  ?  ?  ?  ?  ?  ?  ?  ?  ?  ?  ?  ?  ?  ?   ?   ?   ? ? ?Home Living Family/patient expects to be discharged to:: Private residence ?Living Arrangements: Children;Other relatives (lives with son and granddaughter) ?Available Help at Discharge: Family ?Type of Home: Mobile home ?Home Access: Stairs to enter ?Entrance Stairs-Number of Steps: 4-5 ?Entrance Stairs-Rails: Right ?Home Layout: One level ?  ?  ?Bathroom Shower/Tub: Tub/shower unit ?  ?Bathroom Toilet: Standard ?Bathroom Accessibility: Yes ?  ?Home Equipment: Cane - single Barista (2 wheels);BSC/3in1 ?  ?  ?  ? ?  ?Prior Functioning/Environment Prior Level of Function : Independent/Modified Independent ?  ?  ?  ?  ?  ?  ?Mobility Comments: Pt reports that she only occasionally needs a cane when she has a gout flare-up. ?ADLs Comments: Pt reports being mod I ?  ? ?  ?  ?OT Problem List: Decreased strength;Decreased activity tolerance;Impaired balance (sitting and/or standing);Decreased safety awareness;Cardiopulmonary status limiting activity;Pain;Decreased knowledge of use of DME or AE ?  ?   ?OT Treatment/Interventions: Self-care/ADL training;Therapeutic exercise;Therapeutic activities;Energy conservation;Patient/family education;DME and/or AE instruction;Manual therapy;Balance training  ?  ?OT Goals(Current goals can be found in the care plan section) Acute Rehab OT Goals ?Patient Stated Goal: to go home ?OT Goal Formulation: With patient ?Time For Goal Achievement: 02/12/22 ?Potential to Achieve Goals: Fair ?ADL Goals ?Pt Will Perform Grooming: with modified independence;standing ?Pt Will Perform Lower Body Dressing: with modified independence;sit to/from  stand ?Pt Will Transfer to Toilet: with modified independence;ambulating ?Pt Will Perform Toileting - Clothing Manipulation and hygiene: with modified independence;sit to/from stand  ?OT Frequency: Min 3X/week ?  ? ?   ?AM-PAC OT "6 Clicks" Daily Activity     ?Outcome Measure Help from another person eating meals?: None ?Help from another person taking care of personal grooming?: None ?Help from another person toileting, which includes using toliet, bedpan, or urinal?: A Little ?Help from another person bathing (including washing, rinsing, drying)?: A Little ?Help from another person to put on and taking off regular upper body clothing?: A Little ?Help from another person to put on and taking off regular lower body clothing?: A Little ?6 Click Score: 20 ?  ?End of Session Equipment Utilized During Treatment: Oxygen (2Ls) ?Nurse Communication: Mobility status ? ?Activity Tolerance: Patient tolerated treatment well ?Patient left: in bed;with call bell/phone within reach;with bed alarm set ? ?OT Visit Diagnosis: Unsteadiness on feet (R26.81);Muscle weakness (generalized) (M62.81)  ?              ?Time: 6546-5035 ?OT Time Calculation (min): 14 min ?Charges:  OT General Charges ?$OT Visit: 1 Visit ?OT Evaluation ?$OT Eval Moderate Complexity: 1 Mod ? ?[name], MS, OTR/L , CBIS ?ascom [phone]  ?01/29/22, 3:00 PM  ?

## 2022-01-29 NOTE — Evaluation (Signed)
Physical Therapy Evaluation ?Patient Details ?Name: Danielle [name] ?MRN: [id number] ?DOB: [birth year] ?Today's Date: 01/29/2022 ? ?History of Present Illness ? 66 y.o.  female with medical problems of advanced COPD on chronic oxygen, chronic kidney disease, atrial fibrillation, mild aortic stenosis, history of lung cancer, history of esophageal cancer, MGUS, hyperlipidemia was admitted for shortness of breath was admitted on 01/26/2022 for Pleural effusion. ?  ?Clinical Impression ? Patient alert agreeable to PT with encouragement, but complained of L sided pain throughout, RN notified. She reported at baseline she is ambulatory, and has family nearly 24/7 to assist if needed.  ? ?The patient was able to perform bed mobility modI with extended time. Sit <> stand from EOB with supervision, and able to ambulate ~68ft without RW. Pt with noted decreased gait quality, with RW improved safety noted, as well as gait quality and confidence. She ambulated ~23ft with CGA, but did need a standing rest break. Returned to supine with all needs in reach.  Overall the patient demonstrated deficits (see "PT Problem List") that impede the patient's functional abilities, safety, and mobility and would benefit from skilled PT intervention. Recommendation at this time is HHPT to return pt to PLOF. ?   ?   ? ?Recommendations for follow up therapy are one component of a multi-disciplinary discharge planning process, led by the attending physician.  Recommendations may be updated based on patient status, additional functional criteria and insurance authorization. ? ?Follow Up Recommendations Home health PT ? ?  ?Assistance Recommended at Discharge Frequent or constant Supervision/Assistance  ?Patient can return home with the following ? A little help with walking and/or transfers;A little help with bathing/dressing/bathroom;Help with stairs or ramp for entrance;Assist for transportation ? ?  ?Equipment Recommendations Rolling walker (2  wheels)  ?Recommendations for Other Services ?    ?  ?Functional Status Assessment Patient has had a recent decline in their functional status and demonstrates the ability to make significant improvements in function in a reasonable and predictable amount of time.  ? ?  ?Precautions / Restrictions Precautions ?Precautions: Fall ?Restrictions ?Weight Bearing Restrictions: No  ? ?  ? ?Mobility ? Bed Mobility ?Overal bed mobility: Modified Independent ?  ?  ?  ?  ?  ?  ?General bed mobility comments: no physical assistance ?  ? ?Transfers ?Overall transfer level: Needs assistance ?Equipment used: Rolling walker (2 wheels) ?Transfers: Sit to/from Stand ?Sit to Stand: Min guard ?  ?  ?  ?  ?  ?  ?  ? ?Ambulation/Gait ?Ambulation/Gait assistance: Min guard ?Gait Distance (Feet): 15 Feet ?Assistive device: Rolling walker (2 wheels) ?  ?Gait velocity: decreased ?  ?  ?General Gait Details: 82ft without RW, RW used for remainder of ambulation, pt much safer and more comfortable ? ?Stairs ?  ?  ?  ?  ?  ? ?Wheelchair Mobility ?  ? ?Modified Rankin (Stroke Patients Only) ?  ? ?  ? ?Balance Overall balance assessment: Needs assistance ?Sitting-balance support: Feet supported ?Sitting balance-Leahy Scale: Good ?  ?  ?Standing balance support: During functional activity ?Standing balance-Leahy Scale: Fair ?Standing balance comment: improved with RW ?  ?  ?  ?  ?  ?  ?  ?  ?  ?  ?  ?   ? ? ? ?Pertinent Vitals/Pain Pain Assessment ?Pain Assessment: Faces ?Faces Pain Scale: Hurts even more ?Pain Location: abdomen ?Pain Descriptors / Indicators: Aching, Discomfort ?Pain Intervention(s): Limited activity within patient's tolerance, Monitored during session, Repositioned  ? ? ?  Home Living Family/patient expects to be discharged to:: Private residence ?Living Arrangements: Children;Other relatives (lives with son and granddaughter) ?Available Help at Discharge: Family ?Type of Home: Mobile home ?Home Access: Stairs to enter ?Entrance  Stairs-Rails: Right ?Entrance Stairs-Number of Steps: 4-5 ?  ?Home Layout: One level ?Home Equipment: Cane - single Barista (2 wheels);BSC/3in1 ?   ?  ?Prior Function Prior Level of Function : Independent/Modified Independent ?  ?  ?  ?  ?  ?  ?Mobility Comments: Pt reports that she only occasionally needs a cane when she has a gout flare-up. ?ADLs Comments: Pt reports being mod I ?  ? ? ?Hand Dominance  ? Dominant Hand: Right ? ?  ?Extremity/Trunk Assessment  ? Upper Extremity Assessment ?Upper Extremity Assessment: Overall WFL for tasks assessed;Generalized weakness ?  ? ?Lower Extremity Assessment ?Lower Extremity Assessment: Overall WFL for tasks assessed;Generalized weakness ?  ? ?Cervical / Trunk Assessment ?Cervical / Trunk Assessment: Normal  ?Communication  ? Communication: No difficulties  ?Cognition Arousal/Alertness: Awake/alert ?Behavior During Therapy: [facility] for tasks assessed/performed ?Overall Cognitive Status: Within Functional Limits for tasks assessed ?  ?  ?  ?  ?  ?  ?  ?  ?  ?  ?  ?  ?  ?  ?  ?  ?  ?  ?  ? ?  ?General Comments   ? ?  ?Exercises    ? ?Assessment/Plan  ?  ?PT Assessment Patient needs continued PT services  ?PT Problem List Decreased strength;Decreased mobility;Decreased activity tolerance;Decreased balance;Decreased knowledge of use of DME;Pain ? ?   ?  ?PT Treatment Interventions DME instruction;Therapeutic exercise;Gait training;Balance training;Stair training;Neuromuscular re-education;Functional mobility training;Therapeutic activities;Patient/family education   ? ?PT Goals (Current goals can be found in the Care Plan section)  ?Acute Rehab PT Goals ?Patient Stated Goal: to have decreased ?PT Goal Formulation: With patient ?Time For Goal Achievement: 02/12/22 ?Potential to Achieve Goals: Good ? ?  ?Frequency Min 2X/week ?  ? ? ?Co-evaluation   ?  ?  ?  ?  ? ? ?  ?AM-PAC PT "6 Clicks" Mobility  ?Outcome Measure Help needed turning from your back to your side while  in a flat bed without using bedrails?: A Little ?Help needed moving from lying on your back to sitting on the side of a flat bed without using bedrails?: A Little ?Help needed moving to and from a bed to a chair (including a wheelchair)?: A Little ?Help needed standing up from a chair using your arms (e.g., wheelchair or bedside chair)?: A Little ?Help needed to walk in hospital room?: A Little ?Help needed climbing 3-5 steps with a railing? : A Little ?6 Click Score: 18 ? ?  ?End of Session Equipment Utilized During Treatment: Gait belt ?Activity Tolerance: Patient limited by fatigue ?Patient left: in bed;with call bell/phone within reach;with bed alarm set ?Nurse Communication: Mobility status;Other (comment) (pt pain) ?PT Visit Diagnosis: Other abnormalities of gait and mobility (R26.89);Difficulty in walking, not elsewhere classified (R26.2);Muscle weakness (generalized) (M62.81) ?  ? ?Time: 2423-5361 ?PT Time Calculation (min) (ACUTE ONLY): 21 min ? ? ?Charges:   PT Evaluation ?$PT Eval Low Complexity: 1 Low ?PT Treatments ?$Therapeutic Activity: 8-22 mins ?  ?   ? ?Lieutenant Diego PT, DPT ?4:24 PM,01/29/22 ? ? ?

## 2022-01-29 NOTE — Consult Note (Signed)
?                    NEURO HOSPITALIST CONSULT NOTE  ? ?Requestig physician: Dr. Manuella Ghazi ? ?Reason for Consult:Right facial droop with slurred speech ? ?History obtained from:  Patient and Chart    ? ?HPI:                                                                                                                                         ? Danielle [name] is an 66 y.o. female with a PMHx as noted below, including atrial fibrillation (on Eliquis), who was admitted on 4/14 for management of progressive shortness of breath and tachycardia. A CT chest without contrast showed reaccumulation of right-sided pleural effusion. She underwent successful ultrasound guided right thoracentesis yesterday, yielding 900 mL of pleural fluid. This AM, RN noted right facial droop and slurred speech. A Code Stroke was called and the patient was seen by Teleneurology.  ? ?HPI from Teleneurology note has been reviewed: "Inpatient stroke alert was called for symptoms of right facial droop/slurred speech. ?Pt is a 61 YOF with PMH of HTN, HLD, CHF, CKD IV, LUNG/ESOPHAGEAL CA, COPD, A.FIB on ELIQUIS which is on hold who was noted to have a possible right facial droop/slurred speech. She was last seen normal around 0400 and noted to have sypmtoms around 0645. She has plureal effusion tap yesterday and eliquis on hold for it." ? ?Impression from Teleneurology note has also been reviewed: "Pt is a 71 YOF with PMH of HTN, HLD, CHF, CKD IV, LUNG/ESOPHAGEAL CA, COPD, A.FIB on ELIQUIS which is on hold who was noted to have a possible right facial droop/slurred speech. NIHSS: 0. Deferred thromoblytics. Deferred CTA due to poor renal function and low LVO suspcion. Get TIA/STROKE vs MALIGNANCY vs TOXIC/METABOLIC/INFECTIOUS workup." ? ?Past Medical History:  ?Diagnosis Date  ? Anemia   ? Anxiety   ? Aortic atherosclerosis ([place])   ? Aortic valve stenosis 02/10/2018  ? a.) TTE 02/10/2018: EF 55-60%: mild AS with MPG of 12 mmHg. b.) TTE 04/21/2018: EF  35-40%; mild AS with MPG 8 mmHg. c.) TTE 11/07/2019: EF 50-55%; mild AS with MPG 10 mmHg.  ? Arthritis   ? Atrial fibrillation ([place])   ? a.) CHA2DS2-VASc = 4 (age, sex, HTN, aortic plaque). b.) Rate/rhythm maintained on oral carvedilol; chronically anticoagulated on dose reduced apixaban. c.)  Attempted deployment of LAA occlusive device on 01/11/2021; parameters failed and procedure aborted.  ? Breast cancer, left ([place]) 2000  ? a.) T2N1M0; ER/PR (+) --> Tx'd with total mastectomy, LN resection, XRT, and chemotherapy  ? Cancer of right lung ([place]) 07/30/2016  ? a.) adenocarcinoma; ALK, ROS1, PDL1, BRAF, EGFR all negative.  ? Chronic diastolic CHF (congestive heart failure) ([place]) 11/26/2021  ? CKD (chronic kidney disease), stage IV ([place])   ? COPD (chronic obstructive pulmonary disease) ([place])   ?  Dependence on supplemental oxygen   ? Depression   ? Diastolic dysfunction 62/83/1517  ? a.) TTE 02/10/2017: EF 55-60%; G2DD. b.) TTE 04/21/2018: EF 35-40%; mild LA dilation; mod MV regurgitation. c.) TTE 11/07/2019: EF 50-55%; G1DD.  ? DOE (dyspnea on exertion)   ? GERD (gastroesophageal reflux disease)   ? Heart murmur   ? History of 2019 novel coronavirus disease (COVID-19) 10/14/2019  ? HLD (hyperlipidemia)   ? Hypertension   ? Long term current use of anticoagulant   ? a.) apixaban  ? Lymphedema   ? Personal history of chemotherapy   ? Personal history of radiation therapy   ? SBO (small bowel obstruction) ([place]) 11/05/2020  ? Vitamin D deficiency   ? ? ?Past Surgical History:  ?Procedure Laterality Date  ? Breast Biospy Left   ? [place]  ? BREAST SURGERY    ? COLONOSCOPY N/A 04/30/2018  ? Procedure: COLONOSCOPY;  Surgeon: Virgel Manifold, MD;  Location: [facility] ENDOSCOPY;  Service: Endoscopy;  Laterality: N/A;  ? COLONOSCOPY N/A 07/22/2018  ? Procedure: COLONOSCOPY;  Surgeon: Virgel Manifold, MD;  Location: [facility] ENDOSCOPY;  Service: Endoscopy;  Laterality: N/A;  ? COLONOSCOPY WITH PROPOFOL N/A 09/21/2021  ? Procedure:  COLONOSCOPY WITH PROPOFOL;  Surgeon: Benjamine Sprague, DO;  Location: [facility] ENDOSCOPY;  Service: General;  Laterality: N/A;  ? DILATION AND CURETTAGE OF UTERUS    ? ELECTROMAGNETIC NAVIGATION BROCHOSCOPY Right 04/11/2016  ? Procedure: ELECTROMAGNETIC NAVIGATION BRONCHOSCOPY;  Surgeon: Vilinda Boehringer, MD;  Location: [facility] ORS;  Service: Cardiopulmonary;  Laterality: Right;  ? ESOPHAGOGASTRODUODENOSCOPY N/A 07/22/2018  ? Procedure: ESOPHAGOGASTRODUODENOSCOPY (EGD);  Surgeon: Virgel Manifold, MD;  [facility] ENDOSCOPY;  Service: Endoscopy;  Laterality: N/A;  ? ESOPHAGOGASTRODUODENOSCOPY (EGD) WITH PROPOFOL N/A 05/07/2018  ? Procedure: ESOPHAGOGASTRODUODENOSCOPY (EGD) WITH PROPOFOL;  Surgeon: Lucilla Lame, MD;  Location: [facility] ENDOSCOPY;  Service: Endoscopy;  Laterality: N/A;  ? ESOPHAGOGASTRODUODENOSCOPY (EGD) WITH PROPOFOL N/A 04/24/2019  ? Procedure: ESOPHAGOGASTRODUODENOSCOPY (EGD) WITH PROPOFOL;  Surgeon: Jonathon Bellows, MD;  Location: [facility] ENDOSCOPY;  Service: Gastroenterology;  Laterality: N/A;  ? ESOPHAGOGASTRODUODENOSCOPY (EGD) WITH PROPOFOL N/A 01/12/2020  ? Procedure: ESOPHAGOGASTRODUODENOSCOPY (EGD) WITH PROPOFOL;  Surgeon: Jonathon Bellows, MD;  Location: [facility] ENDOSCOPY;  Service: Gastroenterology;  Laterality: N/A;  ? ESOPHAGOGASTRODUODENOSCOPY (EGD) WITH PROPOFOL N/A 04/28/2020  ? Procedure: ESOPHAGOGASTRODUODENOSCOPY (EGD) WITH PROPOFOL;  Surgeon: Jonathon Bellows, MD;  Location: [facility] ENDOSCOPY;  Service: Gastroenterology;  Laterality: N/A;  ? EUS N/A 05/07/2019  ? Procedure: FULL UPPER ENDOSCOPIC ULTRASOUND (EUS) RADIAL;  Surgeon: Jola Schmidt, MD;  Location: [facility] ENDOSCOPY;  Service: Endoscopy;  Laterality: N/A;  ? ILEOSCOPY N/A 07/22/2018  ? Procedure: ILEOSCOPY THROUGH STOMA;  Surgeon: Virgel Manifold, MD;  Location: [facility] ENDOSCOPY;  Service: Endoscopy;  Laterality: N/A;  ? ILEOSTOMY    ? ILEOSTOMY N/A 09/08/2018  ? Procedure: ILEOSTOMY REVISION POSSIBLE CREATION;  Surgeon: Herbert Pun, MD;   Location: [facility] ORS;  Service: General;  Laterality: N/A;  ? ILEOSTOMY CLOSURE N/A 08/15/2018  ? Procedure: DILATION OF ILEOSTOMY STRICTURE;  Surgeon: Herbert Pun, MD;  Location: [facility] ORS;  Service: General;  Laterality: N/A;  ? LAPAROTOMY Right 05/04/2018  ? Procedure: EXPLORATORY LAPAROTOMY right colectomy right and left ostomy;  Surgeon: Herbert Pun, MD;  Location: [facility] ORS;  Service: General;  Laterality: Right;  ? LEFT ATRIAL APPENDAGE OCCLUSION N/A 01/11/2021  ? Procedure: LEFT ATRIAL APPENDAGE OCCLUSION ([place]); ABORTED PROCEDURE WITHOUT DEVICE BEING IMPLANTED; Location: [facility]; Surgeon: Mylinda Latina, MD  ? LUNG BIOPSY    ? MASTECTOMY Left   ?  2000, [place]  ? ROTATOR CUFF REPAIR Right   ? [place]  ? XI ROBOTIC ASSISTED COLOSTOMY TAKEDOWN N/A 10/23/2021  ? Procedure: XI ROBOTIC ASSISTED ILEOSTOMY TAKEDOWN;  Surgeon: Herbert Pun, MD;  Location: [facility] ORS;  Service: General;  Laterality: N/A;  180 minutes for the surgery part please  ? ? ?Family History  ?Problem Relation Age of Onset  ? Breast cancer Mother 26  ? Cancer Mother   ?     Breast   ? Cirrhosis Father   ? Breast cancer Paternal Aunt 88  ? Cancer Maternal Aunt   ?     Breast   ?         ? ?Social History:  reports that she quit smoking about 9 years ago. Her smoking use included cigarettes. She has a 10.00 pack-year smoking history. Her smokeless tobacco use includes snuff. She reports current alcohol use. She reports that she does not use drugs. ? ?No Known Allergies ? ?MEDICATIONS:                                                                                                                     ?Prior to Admission:  ?Medications Prior to Admission  ?Medication Sig Dispense Refill Last Dose  ? amiodarone (PACERONE) 200 MG tablet Take 1 tablet (200 mg total) by mouth 2 (two) times daily. 60 tablet 0 01/25/2022 at 2200  ? apixaban (ELIQUIS) 2.5 MG TABS tablet Take 2.5 mg by mouth 2 (two) times daily.   01/25/2022 at 2200   ? citalopram (CELEXA) 40 MG tablet Take 40 mg by mouth every morning.   01/25/2022 at 0900  ? Fluticasone-Umeclidin-Vilant 100-62.5-25 MCG/INH AEPB Inhale 1 puff into the lungs every morning.   01/25/2022 at 090

## 2022-01-29 NOTE — Assessment & Plan Note (Signed)
Nephrology following.  Creatinine worsening will hold Lasix ? ?Lab Results  ?Component Value Date  ? CREATININE 2.90 (H) 01/29/2022  ? CREATININE 2.63 (H) 01/28/2022  ? CREATININE 2.11 (H) 01/27/2022  ? ?

## 2022-01-29 NOTE — Consult Note (Signed)
0655-Code Stroke called ?0701-Left for CT ?0715-Returned from CT ?0718-Dr Posey Pronto to camera ?

## 2022-01-29 NOTE — Assessment & Plan Note (Signed)
Followed by Dr. Rogue Bussing ?

## 2022-01-29 NOTE — Progress Notes (Signed)
This RN responding to Rapid Response, Care RN/Charge at bedside. No needs at this time available by phone. ?

## 2022-01-29 NOTE — Progress Notes (Signed)
?Progress Note ? ? ?Patient: Danielle [name] EZM:[id number] DOB: 06/05/1956 DOA: 01/26/2022     3 ?DOS: the patient was seen and examined on 01/29/2022 ?  ?Brief hospital course: ?66 y.o. female with medical history significant for COPD on 2 L home O2, CHF, CKD 4, remote history of breast cancer, history of lung cancer, esophageal cancer, MGUS, pression, hypertension, hyperlipidemia, A-fib on Eliquis, who presents with shortness of breath. ? ?4/15: Cardio consult for A-flutter.  Nephro consult for acute on CKD stage IV, thoracentesis tomorrow under ultrasound guidance.  Eliquis on hold.  Epistaxis-ENT evaluation if not stop by conservative management ?4/16: Status post US guided thoracentesis with removal of 900 cc of fluid ?4/17: Suspected TIA involving left MCA territory last night.  Patient refusing MRI.  Eliquis restarted.  Started statin.  Neurology seen ? ? ? ?Assessment and Plan: ?* Pleural effusion ?S/p US guided thoracentesis on 4/16 with 900 mL of fluid removal.  Labs sent ?Considering her worsening kidney failure we will hold off on Lasix ? ?TIA (transient ischemic attack) ?Involving left MCA distribution with signs/symptoms of right facial droop/slurred speech which resolved ? ?CT head negative.  Patient refused MRI.  Eliquis restarted today also added Lipitor. ?Code stroke was activated last night and seen by teleneurology ?Neurology seen.  Patient's symptoms are resolved now.  Will order 2D echo and carotid Dopplers as she cannot get CT angio due to kidney failure and she is refusing MRI ? ? ? ?Epistaxis ?Resolved now.  ENT input appreciated.  Eliquis restarted today ? ?Known medical problems ?Depression-continue citalopram ?GERD-continue PPI ?Hypotension- resume midodrine ? ?Elevated troponin ?Due to demand ischemia.  Cardiology following. ? ?CKD (chronic kidney disease), stage IV ([place]) ?Nephrology following.  Creatinine worsening will hold Lasix ? ?Lab Results  ?Component Value Date  ? CREATININE 2.90  (H) 01/29/2022  ? CREATININE 2.63 (H) 01/28/2022  ? CREATININE 2.11 (H) 01/27/2022  ? ? ?Malignant neoplasm of lower third of esophagus (HCC) ?Follows with Dr. Rogue Bussing ? ?Atrial flutter ([place]) ?Continue amiodarone.  Resume Eliquis today.  Metoprolol with holding parameters ? ?HTN (hypertension) ?Blood pressure running low.  Holding parameters for Lopressor.  Continue amiodarone ? ?Malignant neoplasm of upper lobe of right lung ([place]) ?Followed by Dr. Rogue Bussing ? ?Moderate COPD (chronic obstructive pulmonary disease) ([place]) ?No evidence of acute exacerbation at present, on 3 L oxygen.  Wean oxygen as able ? ? ? ? ?  ? ?Subjective: She had symptoms of facial droop and slurred speech last night requiring TIA/stroke work-up.  She is feeling back to normal but feeling weak and hypotensive ? ?Physical Exam: ?Vitals:  ? 01/29/22 0745 01/29/22 1128 01/29/22 1320 01/29/22 1623  ?BP: (!) 89/62 (!) 83/70 95/80 92/65   ?Pulse: 90 88 (!) 58 74  ?Resp: 20 20 16 20   ?Temp: 98.2 ?F (36.8 ?C) 98.2 ?F (36.8 ?C) 98.3 ?F (36.8 ?C) 98 ?F (36.7 ?C)  ?TempSrc: Oral Oral Oral Oral  ?SpO2: 99% 99% 99% 100%  ?Weight:      ?Height:      ? ?66 year old cachectic female lying in the bed in norespiratory distress ?Lungs decreased breath sounds at the bases.  No rhonchi ?Cardiovascular S1-S2 normal, no murmur rales or gallop ?Abdomen soft, benign ?Neuro alert and oriented, nonfocal ?Skin no rash or lesion ? ?Data Reviewed: ? ?Creatinine 2.9, hemoglobin 9.6 ? ?Family Communication: Daughter updated over phone ? ?Disposition: ?Status is: Inpatient ?Remains inpatient appropriate because: Getting stroke work-up including 2D echo and carotid Dopplers as patient refused  MRI, cannot get CTA due to kidney failure ? ? Planned Discharge Destination: Home with Home Health ? ? ? DVT prophylaxis/Eliquis ?Time spent: 35 minutes ? ?Author: ?Max Sane, MD ?01/29/2022 5:46 PM ? ?For on call review [email or web address].  ?

## 2022-01-29 NOTE — Consult Note (Addendum)
[facility] ?[facility] Tele[facility]urology Consult Services ? ? ?Patient Name:   Danielle, [name] ?Date of Birth:   September 21, 1956 ?Identification Number:   MRN - [id number] ?Date of Service:   01/29/2022 07:12:44 ? ?Diagnosis: ?      G45.9 - Transient cerebral ischemic attack, unspecified ?      I63.9 - Cerebrovascular accident (CVA), unspecified mechanism ([place]) ? ?Impression: ?Pt is a 43 YOF with PMH of HTN, HLD, CHF, CKD IV, LUNG/ESOPHAGEAL CA, COPD, A.FIB on ELIQUIS which is on hold who was noted to have a possible right facial droop/slurred speech. NIHSS: 0. Deferred thromoblytics. Deferred CTA due to poor renal function and low LVO suspcion. Get TIA/STROKE vs MALIGNANCY vs TOXIC/METABOLIC/INFECTIOUS workup. ?  ? Monitor neuro checks/VS q4h with telemetry. ? Recommend fall precautions. ? Goal SBP b/w 160-180 today, 140-160 -> 100-140 afterwards. ? Start STATIN if no contraindications. ? Restart ELIQUIS after MRI today if no stroke or ICH. ? Get MRI BRAIN W/O, CAROTID US, and ECHO. ? Get COVID, UDS, ETOH, ESR/CRP, TROP, CK, TSH, B12, BNP, LACTIC ACID, LIPID PANEL, and A1C. ? Get WORKUP for TOXIC/METABOLIC/INFECTIOUS causes. ? PT/OT/ST eval. ?  ? ?Our recommendations are outlined below. ? ?Recommendations: ? ?      Stroke/Telemetry Floor ?      Neuro Checks ?      Bedside Swallow Eval ?      DVT Prophylaxis ?      IV Fluids, Normal Saline ?      Head of Bed 30 Degrees ?      Euglycemia and Avoid Hyperthermia (PRN Acetaminophen) ? ? ?Sign Out: ?      Discussed with Emergency Department Provider ? ? ? ?------------------------------------------------------------------------------ ? ?Advanced Imaging: ?Advanced Imaging Deferred because: ? ?Non-disabling symptoms as verified by the patient; no cortical signs so not consistent with LVO ? ? ?Metrics: ?Last Known Well: 01/29/2022 04:00:00 ?[facility] Notification Time: 01/29/2022 07:12:44 ?Stamp Time: 01/29/2022 07:12:44 ?Initial Response Time: 01/29/2022  07:14:07 ?Symptoms: right facial droop/slurred speech. ?NIHSS Start Assessment Time: 01/29/2022 07:19:35 ?Patient is not a candidate for Thrombolytic. ?Thrombolytic Medical Decision: 01/29/2022 07:30:13 ?Patient was not deemed candidate for Thrombolytic because of following reasons: ?No disabling symptoms. ? ?CT head showed no acute hemorrhage or acute core infarct. There is no acute intracranial hemorrhage or evidence of acute infarction. ASPECT score is 10. ? ?Primary Provider Notified of Diagnostic Impression and Management Plan on: 01/29/2022 07:47:51 ?Spoke With: NP Neomia Glass ?Able to Reach ?01/29/2022 07:47:51 ? ? ? ?------------------------------------------------------------------------------ ? ?History of Present Illness: ?Patient is a 66 year old Female. ? ?Inpatient stroke alert was called for symptoms of right facial droop/slurred speech. ?Pt is a 63 YOF with PMH of HTN, HLD, CHF, CKD IV, LUNG/ESOPHAGEAL CA, COPD, A.FIB on ELIQUIS which is on hold who was noted to have a possible right facial droop/slurred speech. She was last seen normal around 0400 and noted to have sypmtoms around 0645. She has plureal effusion tap yesterday and eliquis on hold for it. ? ? ?Past Medical History: ?     Hypertension ?     Hyperlipidemia ?     Atrial Fibrillation ?     There is no history of Diabetes Mellitus ?     There is no history of Coronary Artery Disease ?     There is no history of Stroke ?     There is no history of Seizures ? ?Medications: ? ?No Anticoagulant use  ?No Antiplatelet use ?Reviewed EMR for current medications ? ?Allergies:  ?  Reviewed ? ?Social History: ?Smoking: Former ?Alcohol Use: No ?Drug Use: No ? ?Family History: ? ?There is no family history of premature cerebrovascular disease pertinent to this consultation ? ?ROS : ?14 Points Review of Systems was performed and was negative except mentioned in HPI. ? ?Past Surgical History: ?There Is No Surgical History Contributory To Today?s  Visit ? ? ? ?Examination: ?BP(108/83), Pulse(121), Blood Glucose(142) ?1A: Level of Consciousness - Alert; keenly responsive + 0 ?1B: Ask Month and Age - Both Questions Right + 0 ?1C: Blink Eyes & Squeeze Hands - Performs Both Tasks + 0 ?2: Test Horizontal Extraocular Movements - Normal + 0 ?3: Test Visual Fields - No Visual Loss + 0 ?4: Test Facial Palsy (Use Grimace if Obtunded) - Normal symmetry + 0 ?5A: Test Left Arm Motor Drift - No Drift for 10 Seconds + 0 ?5B: Test Right Arm Motor Drift - No Drift for 10 Seconds + 0 ?6A: Test Left Leg Motor Drift - No Drift for 5 Seconds + 0 ?6B: Test Right Leg Motor Drift - No Drift for 5 Seconds + 0 ?7: Test Limb Ataxia (FNF/Heel-Shin) - No Ataxia + 0 ?8: Test Sensation - Normal; No sensory loss + 0 ?9: Test Language/Aphasia - Normal; No aphasia + 0 ?10: Test Dysarthria - Normal + 0 ?11: Test Extinction/Inattention - No abnormality + 0 ? ?NIHSS Score: 0 ? ? ?Pre-Morbid Modified Rankin Scale: 0 Points = No symptoms at all ? ? ?Patient/Family was informed the Neurology Consult would occur via TeleHealth consult by way of interactive audio and video telecommunications and consented to receiving care in this manner. ? ? ?Patient is being evaluated for possible acute neurologic impairment and high probability of imminent or life-threatening deterioration. I spent total of 35 minutes providing care to this patient, including time for face to face visit via telemedicine, review of medical records, imaging studies and discussion of findings with providers, the patient and/or family. ? ? ?Dr Currie Paris ? ? ?[facility] ?[phone] ? ? ?Case [id number] ? ?

## 2022-01-29 NOTE — Assessment & Plan Note (Signed)
Follows with Dr. Rogue Bussing ?

## 2022-01-29 NOTE — Assessment & Plan Note (Signed)
Blood pressure running low.  Holding parameters for Lopressor.  Continue amiodarone ?

## 2022-01-29 NOTE — Progress Notes (Signed)
Initial Nutrition Assessment ? ?DOCUMENTATION CODES:  ? ?Underweight ? ?INTERVENTION:  ? ?-Nepro Shake po TID, each supplement provides 425 kcal and 19 grams protein  ?-MVI with minerals daily ? ?NUTRITION DIAGNOSIS:  ? ?Severe Malnutrition related to chronic illness (COPD) as evidenced by moderate fat depletion, severe fat depletion, moderate muscle depletion, severe muscle depletion. ? ?GOAL:  ? ?Patient will meet greater than or equal to 90% of their needs ? ?MONITOR:  ? ?PO intake, Supplement acceptance, Labs, Weight trends, Skin, I & O's ? ?REASON FOR ASSESSMENT:  ? ?Malnutrition Screening Tool ?  ? ?ASSESSMENT:  ? ?Danielle [name] is a 66 y.o. female with medical history significant for COPD on 2 L home O2, CHF, CKD 4, remote history of breast cancer, history of lung cancer, esophageal cancer, MGUS, pression, hypertension, hyperlipidemia, A-fib on Eliquis, who presents with shortness of breath. ? ?Pt admitted with pleural effusion.  ? ?4/16- s/p rt thoracentesis (900 ml removed)  ?4/17- code stroke ? ?Reviewed I/O's: +345 ml x 24 hours and +182 ml since admission ? ?UOP: 75 ml x 24 hours ? ?Spoke with pt at bedside, who reports improved appetite since hospitalization. Per pt, intake has been poor at home- pt shares ongoing poor appetite over the past several months secondary to GI distress. Documented meal completions 50-80%. Pt consumed eggs, biscuit, and yogurt this morning for breakfast without difficulty and is looking forward to her cheeseburger that she ordered for lunch.   ? ?Reviewed wt hx; pt has experienced a 6.8% wt loss over the past 6 months, which is not significant for time frame. Pt endorses ongoing weight loss stating "I've lost all my weight and more than I can lose".  ? ?Discussed importance of good meal and supplement intake to promote healing. Pt amenable to Nepro supplements. Pt has been trialed on multiple supplements and likes these best.  ? ?Medications reviewed and include  lasix. ? ?Labs reviewed: CBGS: 142.  ? ?NUTRITION - FOCUSED PHYSICAL EXAM: ? ?Flowsheet Row Most Recent Value  ?Orbital Region Moderate depletion  ?Upper Arm Region Severe depletion  ?Thoracic and Lumbar Region Severe depletion  ?Buccal Region Moderate depletion  ?Temple Region Moderate depletion  ?Clavicle Bone Region Severe depletion  ?Clavicle and Acromion Bone Region Severe depletion  ?Scapular Bone Region Severe depletion  ?Dorsal Hand Severe depletion  ?Patellar Region Severe depletion  ?Anterior Thigh Region Severe depletion  ?Posterior Calf Region Severe depletion  ?Edema (RD Assessment) None  ?Hair Reviewed  ?Eyes Reviewed  ?Mouth Reviewed  ?Skin Reviewed  ?Nails Reviewed  ? ?  ? ? ?Diet Order:   ?Diet Order   ? ?       ?  Diet regular Room service appropriate? Yes; Fluid consistency: Thin  Diet effective now       ?  ? ?  ?  ? ?  ? ? ?EDUCATION NEEDS:  ? ?Education needs have been addressed ? ?Skin:  Skin Assessment: Reviewed RN Assessment ? ?Last BM:  01/28/22 ? ?Height:  ? ?Ht Readings from Last 1 Encounters:  ?01/26/22 5\' 6"  (1.676 m)  ? ? ?Weight:  ? ?Wt Readings from Last 1 Encounters:  ?01/26/22 53.7 kg  ? ? ?Ideal Body Weight:  59.1 kg ? ?BMI:  Body mass index is 19.11 kg/m?. ? ?Estimated Nutritional Needs:  ? ?Kcal:  1700-1900 ? ?Protein:  90-105 grams ? ?Fluid:  > 1.7 L ? ? ? ?Loistine Chance, RD, LDN, CDCES ?Registered Dietitian II ?Certified Diabetes Care and  Education Specialist ?Please refer to AMION for RD and/or RD on-call/weekend/after hours pager  ?

## 2022-01-29 NOTE — Assessment & Plan Note (Signed)
Due to demand ischemia.  Cardiology following. ?

## 2022-01-29 NOTE — Progress Notes (Signed)
[facility] Cardiology ? ?CARDIOLOGY CONSULT NOTE  ?Patient ID: ?Danielle [name] ?MRN: [id number] ?DOB/AGE: Nov 13, 1955 66 y.o. ? ?Admit date: 01/26/2022 ?Referring Physician [name] ?Primary Physician Center, [facility] ?Primary Cardiologist [name] ?Reason for Consultation AF ? ?HPI:  ?Avereigh Spainhower is a 66 year old female with history of advanced COPD on chronic oxygen (2 L), CKD (baseline creatinine 2-2.5), atrial fibrillation, mild aortic stenosis who was admitted with shortness of breath.  Cardiology is consulted for assistance with atrial fibrillation. ? ?Interval history: ?- rapid response called this morning after slurred speech and R sided facial droop, was hypotensive to the 80s, eliquis restarted this morning ?- feels like her speech is not yet back to baseline (also does not have her dentures), no chest pain, SOB. Just feels tired.  ? ?Review of systems complete and found to be negative unless listed above  ? ?Past Medical History:  ?Diagnosis Date  ? Anemia   ? Anxiety   ? Aortic atherosclerosis ([place])   ? Aortic valve stenosis 02/10/2018  ? a.) TTE 02/10/2018: EF 55-60%: mild AS with MPG of 12 mmHg. b.) TTE 04/21/2018: EF 35-40%; mild AS with MPG 8 mmHg. c.) TTE 11/07/2019: EF 50-55%; mild AS with MPG 10 mmHg.  ? Arthritis   ? Atrial fibrillation ([place])   ? a.) CHA2DS2-VASc = 4 (age, sex, HTN, aortic plaque). b.) Rate/rhythm maintained on oral carvedilol; chronically anticoagulated on dose reduced apixaban. c.)  Attempted deployment of LAA occlusive device on 01/11/2021; parameters failed and procedure aborted.  ? Breast cancer, left ([place]) 2000  ? a.) T2N1M0; ER/PR (+) --> Tx'd with total mastectomy, LN resection, XRT, and chemotherapy  ? Cancer of right lung ([place]) 07/30/2016  ? a.) adenocarcinoma; ALK, ROS1, PDL1, BRAF, EGFR all negative.  ? Chronic diastolic CHF (congestive heart failure) ([place]) 11/26/2021  ? CKD (chronic kidney disease), stage IV ([place])   ? COPD (chronic obstructive pulmonary  disease) ([place])   ? Dependence on supplemental oxygen   ? Depression   ? Diastolic dysfunction 56/21/3086  ? a.) TTE 02/10/2017: EF 55-60%; G2DD. b.) TTE 04/21/2018: EF 35-40%; mild LA dilation; mod MV regurgitation. c.) TTE 11/07/2019: EF 50-55%; G1DD.  ? DOE (dyspnea on exertion)   ? GERD (gastroesophageal reflux disease)   ? Heart murmur   ? History of 2019 novel coronavirus disease (COVID-19) 10/14/2019  ? HLD (hyperlipidemia)   ? Hypertension   ? Long term current use of anticoagulant   ? a.) apixaban  ? Lymphedema   ? Personal history of chemotherapy   ? Personal history of radiation therapy   ? SBO (small bowel obstruction) ([place]) 11/05/2020  ? Vitamin D deficiency   ?  ?Past Surgical History:  ?Procedure Laterality Date  ? Breast Biospy Left   ? [place]  ? BREAST SURGERY    ? COLONOSCOPY N/A 04/30/2018  ? Procedure: COLONOSCOPY;  Surgeon: Virgel Manifold, MD;  Location: [facility] ENDOSCOPY;  Service: Endoscopy;  Laterality: N/A;  ? COLONOSCOPY N/A 07/22/2018  ? Procedure: COLONOSCOPY;  Surgeon: Virgel Manifold, MD;  Location[facility] ENDOSCOPY;  Service: Endoscopy;  Laterality: N/A;  ? COLONOSCOPY WITH PROPOFOL N/A 09/21/2021  ? Procedure: COLONOSCOPY WITH PROPOFOL;  Surgeon: Benjamine Sprague, DO;  Location: [facility] ENDOSCOPY;  Service: General;  Laterality: N/A;  ? DILATION AND CURETTAGE OF UTERUS    ? ELECTROMAGNETIC NAVIGATION BROCHOSCOPY Right 04/11/2016  ? Procedure: ELECTROMAGNETIC NAVIGATION BRONCHOSCOPY;  Surgeon: Vilinda Boehringer, MD;  Location: [facility] ORS;  Service: Cardiopulmonary;  Laterality: Right;  ? ESOPHAGOGASTRODUODENOSCOPY N/A  07/22/2018  ? Procedure: ESOPHAGOGASTRODUODENOSCOPY (EGD);  Surgeon: Virgel Manifold, MD;  Location: [facility] ENDOSCOPY;  Service: Endoscopy;  Laterality: N/A;  ? ESOPHAGOGASTRODUODENOSCOPY (EGD) WITH PROPOFOL N/A 05/07/2018  ? Procedure: ESOPHAGOGASTRODUODENOSCOPY (EGD) WITH PROPOFOL;  Surgeon: Lucilla Lame, MD;  Location: [facility] ENDOSCOPY;  Service: Endoscopy;  Laterality: N/A;  ?  ESOPHAGOGASTRODUODENOSCOPY (EGD) WITH PROPOFOL N/A 04/24/2019  ? Procedure: ESOPHAGOGASTRODUODENOSCOPY (EGD) WITH PROPOFOL;  Surgeon: Jonathon Bellows, MD;  Location: [facility] ENDOSCOPY;  Service: Gastroenterology;  Laterality: N/A;  ? ESOPHAGOGASTRODUODENOSCOPY (EGD) WITH PROPOFOL N/A 01/12/2020  ? Procedure: ESOPHAGOGASTRODUODENOSCOPY (EGD) WITH PROPOFOL;  Surgeon: Jonathon Bellows, MD;  Location: [facility] ENDOSCOPY;  Service: Gastroenterology;  Laterality: N/A;  ? ESOPHAGOGASTRODUODENOSCOPY (EGD) WITH PROPOFOL N/A 04/28/2020  ? Procedure: ESOPHAGOGASTRODUODENOSCOPY (EGD) WITH PROPOFOL;  Surgeon: Jonathon Bellows, MD;  Location: [facility] ENDOSCOPY;  Service: Gastroenterology;  Laterality: N/A;  ? EUS N/A 05/07/2019  ? Procedure: FULL UPPER ENDOSCOPIC ULTRASOUND (EUS) RADIAL;  Surgeon: Jola Schmidt, MD;  Location: [facility] ENDOSCOPY;  Service: Endoscopy;  Laterality: N/A;  ? ILEOSCOPY N/A 07/22/2018  ? Procedure: ILEOSCOPY THROUGH STOMA;  Surgeon: Virgel Manifold, MD;  Location: [facility] ENDOSCOPY;  Service: Endoscopy;  Laterality: N/A;  ? ILEOSTOMY    ? ILEOSTOMY N/A 09/08/2018  ? Procedure: ILEOSTOMY REVISION POSSIBLE CREATION;  Surgeon: Herbert Pun, MD;  Location: [facility] ORS;  Service: General;  Laterality: N/A;  ? ILEOSTOMY CLOSURE N/A 08/15/2018  ? Procedure: DILATION OF ILEOSTOMY STRICTURE;  Surgeon: Herbert Pun, MD;  Location: [facility] ORS;  Service: General;  Laterality: N/A;  ? LAPAROTOMY Right 05/04/2018  ? Procedure: EXPLORATORY LAPAROTOMY right colectomy right and left ostomy;  Surgeon: Herbert Pun, MD;  Location: [facility] ORS;  Service: General;  Laterality: Right;  ? LEFT ATRIAL APPENDAGE OCCLUSION N/A 01/11/2021  ? Procedure: LEFT ATRIAL APPENDAGE OCCLUSION ([place]); ABORTED PROCEDURE WITHOUT DEVICE BEING IMPLANTED; Location: [facility]; Surgeon: Mylinda Latina, MD  ? LUNG BIOPSY    ? MASTECTOMY Left   ? 2000, [facility]  ? ROTATOR CUFF REPAIR Right   ? [place]  ? XI ROBOTIC ASSISTED COLOSTOMY TAKEDOWN N/A 10/23/2021   ? Procedure: XI ROBOTIC ASSISTED ILEOSTOMY TAKEDOWN;  Surgeon: Herbert Pun, MD;  Location: [facility] ORS;  Service: General;  Laterality: N/A;  180 minutes for the surgery part please  ?  ?Medications Prior to Admission  ?Medication Sig Dispense Refill Last Dose  ? amiodarone (PACERONE) 200 MG tablet Take 1 tablet (200 mg total) by mouth 2 (two) times daily. 60 tablet 0 01/25/2022 at 2200  ? apixaban (ELIQUIS) 2.5 MG TABS tablet Take 2.5 mg by mouth 2 (two) times daily.   01/25/2022 at 2200  ? citalopram (CELEXA) 40 MG tablet Take 40 mg by mouth every morning.   01/25/2022 at 0900  ? Fluticasone-Umeclidin-Vilant 100-62.5-25 MCG/INH AEPB Inhale 1 puff into the lungs every morning.   01/25/2022 at 0900  ? metoprolol tartrate (LOPRESSOR) 25 MG tablet Take 0.5 tablets (12.5 mg total) by mouth 2 (two) times daily. 60 tablet 0 01/25/2022 at 2200  ? pantoprazole (PROTONIX) 40 MG tablet Take 1 tablet (40 mg total) by mouth 2 (two) times daily before a meal. 60 tablet 2 01/25/2022 at 1700  ? acetaminophen (TYLENOL) 325 MG tablet Take 2 tablets (650 mg total) by mouth every 6 (six) hours as needed for mild pain (or Fever >/= 101). (Patient taking differently: Take 650 mg by mouth every 4 (four) hours as needed for moderate pain.)   prn at unknown  ? albuterol (PROVENTIL HFA;VENTOLIN HFA) 108 (90 Base) MCG/ACT inhaler Inhale  2 puffs into the lungs every 6 (six) hours as needed for wheezing or shortness of breath. 1 Inhaler 2 prn at unknown  ? calcium carbonate (TUMS - DOSED IN MG ELEMENTAL CALCIUM) 500 MG chewable tablet Chew 1 tablet by mouth daily.   prn at unknown  ? DILT-XR 240 MG 24 hr capsule Take 240 mg by mouth daily. (Patient not taking: Reported on 01/26/2022)   Not Taking  ? fidaxomicin (DIFICID) 200 MG TABS tablet Take 1 tablet (200 mg total) by mouth 2 (two) times daily. (Patient not taking: Reported on 01/26/2022) 15 tablet 0 Not Taking  ? midodrine (PROAMATINE) 5 MG tablet Take 1 tablet (5 mg total) by mouth 3  (three) times daily with meals. 60 tablet 0 prn at unknown  ? OXYGEN Inhale 2 L into the lungs as needed (shortness of breath).     ? sucralfate (CARAFATE) 1 GM/10ML suspension Take 10 mLs (1 g total) by mouth

## 2022-01-29 NOTE — Progress Notes (Signed)
? ?      CROSS COVER NOTE ? ?NAME: YOALI CONRY ?MRN: [id number] ?DOB : 08/31/1956 ? ? ? ?Date of Service ?  01/29/2022  ?HPI/Events of Note ?  CODE STROKE called on patient for slurred speech and (R) sided facial droop. ? ?CBG 142 ?NIH 1 (mildly slurred speech)  ?Interventions ?  Plan: ?STAT CT Head ?Teleneuro evaluation ?Dr Posey Pronto with teleneuro recommends STAT MRI and to restart Eliquis pending results ? ?   ?  ?Neomia Glass MHA, MSN, FNP-BC ?Nurse Practitioner ?[facility] ?[place] ?Pager [phone] ? ?

## 2022-01-29 NOTE — Assessment & Plan Note (Addendum)
Involving left MCA distribution with signs/symptoms of right facial droop/slurred speech which resolved ? ?CT head negative.  Patient refused MRI.  Eliquis restarted today also added Lipitor. ?Code stroke was activated last night and seen by teleneurology ?Neurology seen.  Patient's symptoms are resolved now.  Will order 2D echo and carotid Dopplers as she cannot get CT angio due to kidney failure and she is refusing MRI ? ? ?

## 2022-01-29 NOTE — Assessment & Plan Note (Signed)
Continue amiodarone.  Resume Eliquis today.  Metoprolol with holding parameters ?

## 2022-01-29 NOTE — Assessment & Plan Note (Signed)
No evidence of acute exacerbation at present, on 3 L oxygen.  Wean oxygen as able ?

## 2022-01-29 NOTE — Assessment & Plan Note (Addendum)
Depression-continue citalopram ?GERD-continue PPI ?Hypotension- resume midodrine ?

## 2022-01-29 NOTE — Assessment & Plan Note (Signed)
Resolved now.  ENT input appreciated.  Eliquis restarted today ?

## 2022-01-29 NOTE — TOC Initial Note (Signed)
Transition of Care [facility]) - Initial/Assessment Note    Patient Details  Name: Danielle [name] MRN: [id number] Date of Birth: July 08, 1956  Transition of Care [facility]) CM/SW Contact:    Margarito Liner, LCSW Phone Number: 01/29/2022, 11:41 AM  Clinical Narrative:   Readmission prevention screen complete. CSW met with patient. No supports at bedside. CSW introduced role and explained that discharge planning would be discussed. PCP is Shane Crutch, PA at [facility]. Patient uses a transportation service to get to appointments. She uses [employer]. No issues obtaining medications. No home health prior to admission. Asked if she would be interested in that and she said she would think about it. She has a BSC, RW, and SPC at home. She is on 2 L chronic oxygen at home through [employer]. Likely discharge tomorrow. Her daughter can pick her up if discharged between 8-10 am. MD is aware. She is unsure who can pick her up after that. Told her to remind her daughter to bring her oxygen. No further concerns. CSW encouraged patient to contact CSW as needed. CSW will continue to follow patient for support and facilitate return home when stable.               Expected Discharge Plan: Home/Self Care Barriers to Discharge: Continued Medical Work up   Patient Goals and CMS Choice        Expected Discharge Plan and Services Expected Discharge Plan: Home/Self Care     Post Acute Care Choice:  (TBD) Living arrangements for the past 2 months: Single Family Home                                      Prior Living Arrangements/Services Living arrangements for the past 2 months: Single Family Home   Patient language and need for interpreter reviewed:: Yes Do you feel safe going back to the place where you live?: Yes      Need for Family Participation in Patient Care: Yes (Comment) Care giver support system in place?: Yes (comment) Current home services: DME Criminal Activity/Legal Involvement  Pertinent to Current Situation/Hospitalization: No - Comment as needed  Activities of Daily Living Home Assistive Devices/Equipment: Oxygen ADL Screening (condition at time of admission) Patient's cognitive ability adequate to safely complete daily activities?: Yes Is the patient deaf or have difficulty hearing?: No Does the patient have difficulty seeing, even when wearing glasses/contacts?: No Does the patient have difficulty concentrating, remembering, or making decisions?: No Patient able to express need for assistance with ADLs?: Yes Does the patient have difficulty dressing or bathing?: Yes Independently performs ADLs?: No Communication: Independent Dressing (OT): Needs assistance Is this a change from baseline?: Change from baseline, expected to last <3days Grooming: Needs assistance Is this a change from baseline?: Change from baseline, expected to last <3 days Feeding: Independent Bathing: Needs assistance Is this a change from baseline?: Change from baseline, expected to last <3 days Toileting: Needs assistance Is this a change from baseline?: Change from baseline, expected to last <3 days In/Out Bed: Needs assistance Is this a change from baseline?: Change from baseline, expected to last <3 days Walks in Home: Independent Does the patient have difficulty walking or climbing stairs?: Yes Weakness of Legs: None Weakness of Arms/Hands: None  Permission Sought/Granted                  Emotional Assessment Appearance:: Appears stated age  Attitude/Demeanor/Rapport: Engaged, Gracious Affect (typically observed): Accepting, Appropriate, Calm, Pleasant Orientation: : Oriented to Self, Oriented to Place, Oriented to  Time, Oriented to Situation Alcohol / Substance Use: Not Applicable Psych Involvement: No (comment)  Admission diagnosis:  Pleural effusion [J90] Pleural effusion, right [J90] Atrial fibrillation with rapid ventricular response (HCC) [I48.91] Subacute  cough [R05.2] Patient Active Problem List   Diagnosis Date Noted   Epistaxis 01/27/2022   Pleural effusion 01/26/2022   Known medical problems 01/26/2022   C. difficile colitis 11/27/2021   Abdominal pain 11/26/2021   HLD (hyperlipidemia) 11/26/2021   Chronic diastolic CHF (congestive heart failure) (HCC) 11/26/2021   Iron deficiency anemia 11/26/2021   Aspiration pneumonia of right lower lobe due to gastric secretions (HCC)    Ileus following gastrointestinal surgery (HCC)    Hypomagnesemia    Anemia of chronic disease    Hyperphosphatemia 10/24/2021   Hypotension 11/17/2020   Ileostomy status (HCC) 11/17/2020   Acute shoulder pain due to trauma, left 11/17/2020   Syncope 11/17/2020   COPD with acute exacerbation (HCC) 08/01/2020   Atrial fibrillation with rapid ventricular response (HCC) 08/01/2020   Cellulitis of left arm 01/20/2020   CKD (chronic kidney disease), stage IV (HCC) 01/20/2020   Hypokalemia 01/20/2020   Elevated troponin 01/20/2020   Sepsis (HCC) 01/20/2020   Acute on chronic combined systolic and diastolic CHF (congestive heart failure) (HCC) 01/20/2020   Pleural effusion due to CHF (congestive heart failure) (HCC) 11/06/2019   Diarrhea    Acute renal failure superimposed on stage 3b chronic kidney disease (HCC) 10/29/2019   GERD (gastroesophageal reflux disease) 10/29/2019   Depression 10/29/2019   Hyperkalemia 10/14/2019   History of 2019 novel coronavirus disease (COVID-19) 10/14/2019   Acute respiratory failure due to COVID-19 (HCC) 09/27/2019   Respiratory tract infection due to COVID-19 virus 09/27/2019   Acute blood loss anemia 06/20/2019   Normocytic anemia 06/20/2019   GI bleeding 06/19/2019   Anemia of chronic kidney failure, stage 4 (severe) (HCC) 06/12/2019   Malignant neoplasm of lower third of esophagus (HCC) 05/01/2019   Acute on chronic respiratory failure with hypoxia and hypercapnia (HCC) 12/22/2018   Ileostomy dysfunction (HCC) 09/28/2018    Reflux esophagitis    Iron deficiency anemia secondary to blood loss (chronic)    SBO (small bowel obstruction) (HCC) 06/29/2018   Chronic systolic heart failure (HCC) 06/09/2018   HTN (hypertension) 06/09/2018   Atrial flutter (HCC) 06/09/2018   Lymphedema 06/09/2018   Acute kidney injury superimposed on CKD (HCC) 06/04/2018   Severe protein-calorie malnutrition (HCC) 06/04/2018   Blood in stool    Focal (segmental) acute (reversible) ischemia of large intestine (HCC)    Ulceration of intestine    Diverticulosis of large intestine without diverticulitis    Abnormal CT scan, colon    Colitis    COPD exacerbation (HCC)    Malnutrition of moderate degree 04/23/2018   Palliative care by specialist    DNR (do not resuscitate) discussion    Weakness    Acute respiratory failure with hypoxia (HCC) 04/21/2018   Hyponatremia 02/10/2017   Syncope and collapse 02/09/2017   Lung nodule, solitary 07/25/2016   Malignant neoplasm of upper lobe of right lung (HCC) 07/25/2016   Carcinoma of overlapping sites of left breast in female, estrogen receptor positive (HCC) 06/22/2016   Multiple lung nodules 06/22/2016   Lesion of right lung    Breast cancer in female (HCC) 02/21/2016   Moderate COPD (chronic obstructive pulmonary disease) (HCC) 08/24/2014  PCP:  [facility] Pharmacy:   [facility] [place] [address] [place] [address] Phone: [phone] Fax: [phone]  [facility] [place], [place] - [address] [place] [address] Phone: [phone] Fax: [phone]  [facility] [place] - [address] [place] [address] Phone: [phone] Fax: [phone]  [facility] [address] [place] [address] Phone: [phone] Fax: [phone]     Social Determinants of Health (SDOH) Interventions     Readmission Risk Interventions    01/29/2022   11:36 AM 11/29/2021   10:49 AM 10/16/2019    3:20 PM  Readmission Risk Prevention Plan  Transportation Screening Complete Complete Complete  Medication Review (RN Care Manager) Complete Complete Referral to Pharmacy  PCP or Specialist appointment within 3-5 days of discharge Complete Complete   HRI or Home Care Consult  Complete Complete  SW Recovery Care/Counseling Consult Complete Complete Complete  Palliative Care Screening Not Applicable Not Applicable Not Applicable  Skilled Nursing Facility Not Applicable Not Applicable Not Applicable

## 2022-01-30 ENCOUNTER — Other Ambulatory Visit: Payer: Self-pay

## 2022-01-30 ENCOUNTER — Inpatient Hospital Stay: Payer: Medicare Other

## 2022-01-30 ENCOUNTER — Inpatient Hospital Stay
Admit: 2022-01-30 | Discharge: 2022-01-30 | Disposition: A | Discharge status: Home / Self Care | Payer: Medicare Other | Attending: Internal Medicine | Admitting: Internal Medicine

## 2022-01-30 DIAGNOSIS — J90 Pleural effusion, not elsewhere classified: Secondary | ICD-10-CM | POA: Diagnosis not present

## 2022-01-30 DIAGNOSIS — Z7189 Other specified counseling: Secondary | ICD-10-CM

## 2022-01-30 DIAGNOSIS — G459 Transient cerebral ischemic attack, unspecified: Secondary | ICD-10-CM | POA: Diagnosis not present

## 2022-01-30 DIAGNOSIS — N184 Chronic kidney disease, stage 4 (severe): Secondary | ICD-10-CM | POA: Diagnosis not present

## 2022-01-30 DIAGNOSIS — I5022 Chronic systolic (congestive) heart failure: Secondary | ICD-10-CM | POA: Diagnosis not present

## 2022-01-30 DIAGNOSIS — Z515 Encounter for palliative care: Secondary | ICD-10-CM

## 2022-01-30 DIAGNOSIS — I484 Atypical atrial flutter: Secondary | ICD-10-CM | POA: Diagnosis not present

## 2022-01-30 LAB — CBC
HCT: 27.2 % — ABNORMAL LOW (ref 36.0–46.0)
Hemoglobin: 8.7 g/dL — ABNORMAL LOW (ref 12.0–15.0)
MCH: 30.1 pg (ref 26.0–34.0)
MCHC: 32 g/dL (ref 30.0–36.0)
MCV: 94.1 fL (ref 80.0–100.0)
Platelets: 236 10*3/uL (ref 150–400)
RBC: 2.89 MIL/uL — ABNORMAL LOW (ref 3.87–5.11)
RDW: 16.9 % — ABNORMAL HIGH (ref 11.5–15.5)
WBC: 7.1 10*3/uL (ref 4.0–10.5)
nRBC: 0.4 % — ABNORMAL HIGH (ref 0.0–0.2)

## 2022-01-30 LAB — ECHOCARDIOGRAM COMPLETE
AR max vel: 0.81 cm2
AV Area VTI: 0.8 cm2
AV Area mean vel: 0.84 cm2
AV Mean grad: 6.3 mmHg
AV Peak grad: 11.6 mmHg
Ao pk vel: 1.7 m/s
Area-P 1/2: 8.34 cm2
Calc EF: 35.5 %
Height: 66 in
S' Lateral: 3 cm
Single Plane A2C EF: 34.8 %
Single Plane A4C EF: 31.7 %
Weight: 1894.19 oz

## 2022-01-30 LAB — BASIC METABOLIC PANEL
Anion gap: 8 (ref 5–15)
BUN: 60 mg/dL — ABNORMAL HIGH (ref 8–23)
CO2: 23 mmol/L (ref 22–32)
Calcium: 8.5 mg/dL — ABNORMAL LOW (ref 8.9–10.3)
Chloride: 106 mmol/L (ref 98–111)
Creatinine, Ser: 2.46 mg/dL — ABNORMAL HIGH (ref 0.44–1.00)
GFR, Estimated: 21 mL/min — ABNORMAL LOW (ref >60)
Glucose, Bld: 97 mg/dL (ref 70–99)
Potassium: 4 mmol/L (ref 3.5–5.1)
Sodium: 137 mmol/L (ref 135–145)

## 2022-01-30 LAB — PROTEIN, BODY FLUID (OTHER): Total Protein, Body Fluid Other: 2.9 g/dL

## 2022-01-30 LAB — ACID FAST SMEAR (AFB, MYCOBACTERIA): Acid Fast Smear: NEGATIVE

## 2022-01-30 LAB — TRIGLYCERIDES, BODY FLUIDS: Triglycerides, Fluid: 22 mg/dL

## 2022-01-30 LAB — MAGNESIUM: Magnesium: 2.1 mg/dL (ref 1.7–2.4)

## 2022-01-30 MED ORDER — FUROSEMIDE 10 MG/ML IJ SOLN
40.0000 mg | Freq: Every day | INTRAMUSCULAR | Status: DC
  Administered 2022-01-31 – 2022-02-01 (×2): 40 mg via INTRAVENOUS
  Filled 2022-01-30 (×2): qty 4

## 2022-01-30 NOTE — Assessment & Plan Note (Signed)
Due to demand ischemia.  Cardiology following. ?

## 2022-01-30 NOTE — Progress Notes (Signed)
Occupational Therapy Treatment ?Patient Details ?Name: Danielle [name] ?MRN: [id number] ?DOB: 13-Aug-1956 ?Today's Date: 01/30/2022 ? ? ?History of present illness 66 y.o.  female with medical problems of advanced COPD on chronic oxygen, chronic kidney disease, atrial fibrillation, mild aortic stenosis, history of lung cancer, history of esophageal cancer, MGUS, hyperlipidemia was admitted for shortness of breath was admitted on 01/26/2022 for Pleural effusion. ?  ?OT comments ? Pt seen for OT treatment on this date. Upon arrival to room, pt awake and seated upright in bed. Pt reporting no pain at rest and agreeable to OT tx. Pt currently presents with decreased activity tolerance, strength, and balance. This date, pt required MIN GUARD for sit>stand transfers. Once upright, pt was able to maintain standing balance at sink-side for 3 mins while completing standing grooming tasks (SpO2 desat 84% while on 2L, however able to increase to 92% following 30 sec of PLB while seated EOB). Following x1 seated rest break, pt able to walk 10ft with RW, requiring MIN GUARD (SpO2 desat 88% however able to increase to 92% following 30sec of PLB). Pt is making good progress toward goals. Will continue to follow POC. Discharge recommendation remains appropriate.    ? ?Recommendations for follow up therapy are one component of a multi-disciplinary discharge planning process, led by the attending physician.  Recommendations may be updated based on patient status, additional functional criteria and insurance authorization. ?   ?Follow Up Recommendations ? Home health OT  ?  ?Assistance Recommended at Discharge Intermittent Supervision/Assistance  ?Patient can return home with the following ? A little help with walking and/or transfers;A little help with bathing/dressing/bathroom;Direct supervision/assist for medications management;Direct supervision/assist for financial management;Help with stairs or ramp for entrance;Assist for  transportation;Assistance with cooking/housework ?  ?Equipment Recommendations ? None recommended by OT  ?  ?   ?Precautions / Restrictions Precautions ?Precautions: Fall ?Restrictions ?Weight Bearing Restrictions: No  ? ? ?  ? ?Mobility Bed Mobility ?Overal bed mobility: Modified Independent ?  ?  ?  ?  ?  ?  ?  ?  ? ?Transfers ?Overall transfer level: Needs assistance ?Equipment used: Rolling walker (2 wheels) ?Transfers: Sit to/from Stand ?Sit to Stand: Min guard ?  ?  ?  ?  ?  ?  ?  ?  ?Balance Overall balance assessment: Needs assistance ?Sitting-balance support: No upper extremity supported, Feet supported ?Sitting balance-Leahy Scale: Good ?Sitting balance - Comments: good sitting balance reaching within BOS ?  ?Standing balance support: Single extremity supported, During functional activity, Reliant on assistive device for balance ?Standing balance-Leahy Scale: Fair ?  ?  ?  ?  ?  ?  ?  ?  ?  ?  ?  ?  ?   ? ?ADL either performed or assessed with clinical judgement  ? ?ADL Overall ADL's : Needs assistance/impaired ?  ?  ?Grooming: Min guard;Standing;Wash/dry hands;Wash/dry face;Applying deodorant;Set up;Sitting ?Grooming Details (indicate cue type and reason): Pt only able to stand for 3 mins at sinkside before reporting fatigue and requiring seated rest break ?  ?  ?  ?  ?Upper Body Dressing : Supervision/safety;Set up;Sitting ?Upper Body Dressing Details (indicate cue type and reason): to don/doff hospital gown while seated EOB ?  ?  ?  ?  ?  ?  ?  ?  ?Functional mobility during ADLs: Min guard;Rolling walker (2 wheels) (to walk 42ft with RW) ?  ?  ? ? ? ?Cognition Arousal/Alertness: Awake/alert ?Behavior During Therapy: [facility] for tasks assessed/performed ?Overall  Cognitive Status: Within Functional Limits for tasks assessed ?  ?  ?  ?  ?  ?  ?  ?  ?  ?  ?  ?  ?  ?  ?  ?  ?  ?  ?  ?   ?   ?   ?   ? ? ?Pertinent Vitals/ Pain       Pain Assessment ?Pain Assessment: No/denies pain ? ?   ?   ? ?Frequency ? Min  3X/week  ? ? ? ? ?  ?Progress Toward Goals ? ?OT Goals(current goals can now be found in the care plan section) ? Progress towards OT goals: Progressing toward goals ? ?Acute Rehab OT Goals ?Patient Stated Goal: to go home ?OT Goal Formulation: With patient ?Time For Goal Achievement: 02/12/22 ?Potential to Achieve Goals: Fair  ?Plan Discharge plan remains appropriate;Frequency remains appropriate   ? ?   ?AM-PAC OT "6 Clicks" Daily Activity     ?Outcome Measure ? ? Help from another person eating meals?: None ?Help from another person taking care of personal grooming?: A Little ?Help from another person toileting, which includes using toliet, bedpan, or urinal?: A Little ?Help from another person bathing (including washing, rinsing, drying)?: A Little ?Help from another person to put on and taking off regular upper body clothing?: A Little ?Help from another person to put on and taking off regular lower body clothing?: A Little ?6 Click Score: 19 ? ?  ?End of Session Equipment Utilized During Treatment: Oxygen ? ?OT Visit Diagnosis: Unsteadiness on feet (R26.81);Muscle weakness (generalized) (M62.81) ?  ?Activity Tolerance Patient tolerated treatment well ?  ?Patient Left in bed;with call bell/phone within reach;with bed alarm set ?  ?Nurse Communication Mobility status ?  ? ?   ? ?Time: 7371-0626 ?OT Time Calculation (min): 32 min ? ?Charges: OT General Charges ?$OT Visit: 1 Visit ?OT Treatments ?$Self Care/Home Management : 23-37 mins ? ?Fredirick Maudlin, OTR/L ?[place] ? ?

## 2022-01-30 NOTE — Assessment & Plan Note (Signed)
Follows with Dr. Rogue Bussing ?

## 2022-01-30 NOTE — Progress Notes (Signed)
[facility] ? ?CARDIOLOGY CONSULT NOTE  ?Patient ID: ?Danielle [name] ?MRN: [id number] ?DOB/AGE: 68-May-1957 68 y.o. ? ?Admit date: 01/26/2022 ?Referring Physician [name] ?Primary Physician [name], [place] ?Primary Cardiologist [name] ?Reason for Consultation AF ? ?HPI:  ?Danielle [name] is a 66 year old female with history of advanced COPD on chronic oxygen (2 L), CKD (baseline creatinine 2-2.5), atrial fibrillation, mild aortic stenosis who was admitted with shortness of breath.  Cardiology is consulted for assistance with atrial fibrillation. ? ?Interval history: ?- feels tired, speech back to baseline today.  No chest pain, SOB with exertion but at her baseline O2.  ?- HR variable with activity, peaking in the 120s with exertion. Ambulated in the room with PT yesterday and she said she felt unsteady.  ? ?Review of systems complete and found to be negative unless listed above  ? ?Past Medical History:  ?Diagnosis Date  ? Anemia   ? Anxiety   ? Aortic atherosclerosis ([place])   ? Aortic valve stenosis 02/10/2018  ? a.) TTE 02/10/2018: EF 55-60%: mild AS with MPG of 12 mmHg. b.) TTE 04/21/2018: EF 35-40%; mild AS with MPG 8 mmHg. c.) TTE 11/07/2019: EF 50-55%; mild AS with MPG 10 mmHg.  ? Arthritis   ? Atrial fibrillation ([place])   ? a.) CHA2DS2-VASc = 4 (age, sex, HTN, aortic plaque). b.) Rate/rhythm maintained on oral carvedilol; chronically anticoagulated on dose reduced apixaban. c.)  Attempted deployment of LAA occlusive device on 01/11/2021; parameters failed and procedure aborted.  ? Breast cancer, left ([place]) 2000  ? a.) T2N1M0; ER/PR (+) --> Tx'd with total mastectomy, LN resection, XRT, and chemotherapy  ? Cancer of right lung ([place]) 07/30/2016  ? a.) adenocarcinoma; ALK, ROS1, PDL1, BRAF, EGFR all negative.  ? Chronic diastolic CHF (congestive heart failure) ([place]) 11/26/2021  ? CKD (chronic kidney disease), stage IV ([place])   ? COPD (chronic obstructive pulmonary disease) ([place])   ? Dependence on supplemental  oxygen   ? Depression   ? Diastolic dysfunction 81/10/7508  ? a.) TTE 02/10/2017: EF 55-60%; G2DD. b.) TTE 04/21/2018: EF 35-40%; mild LA dilation; mod MV regurgitation. c.) TTE 11/07/2019: EF 50-55%; G1DD.  ? DOE (dyspnea on exertion)   ? GERD (gastroesophageal reflux disease)   ? Heart murmur   ? History of 2019 novel coronavirus disease (COVID-19) 10/14/2019  ? HLD (hyperlipidemia)   ? Hypertension   ? Long term current use of anticoagulant   ? a.) apixaban  ? Lymphedema   ? Personal history of chemotherapy   ? Personal history of radiation therapy   ? SBO (small bowel obstruction) ([place]) 11/05/2020  ? Vitamin D deficiency   ?  ?Past Surgical History:  ?Procedure Laterality Date  ? Breast Biospy Left   ? [place]  ? BREAST SURGERY    ? COLONOSCOPY N/A 04/30/2018  ? Procedure: COLONOSCOPY;  Surgeon: Virgel Manifold, MD;  Location: [facility] ENDOSCOPY;  Service: Endoscopy;  Laterality: N/A;  ? COLONOSCOPY N/A 07/22/2018  ? Procedure: COLONOSCOPY;  Surgeon: Virgel Manifold, MD;  Location[facility] ENDOSCOPY;  Service: Endoscopy;  Laterality: N/A;  ? COLONOSCOPY WITH PROPOFOL N/A 09/21/2021  ? Procedure: COLONOSCOPY WITH PROPOFOL;  Surgeon: Benjamine Sprague, DO;  Location: [facility] ENDOSCOPY;  Service: General;  Laterality: N/A;  ? DILATION AND CURETTAGE OF UTERUS    ? ELECTROMAGNETIC NAVIGATION BROCHOSCOPY Right 04/11/2016  ? Procedure: ELECTROMAGNETIC NAVIGATION BRONCHOSCOPY;  Surgeon: Vilinda Boehringer, MD;  Location: [facility] ORS;  Service: Cardiopulmonary;  Laterality: Right;  ? ESOPHAGOGASTRODUODENOSCOPY N/A 07/22/2018  ?  Procedure: ESOPHAGOGASTRODUODENOSCOPY (EGD);  Surgeon: Virgel Manifold, MD;  Location: [facility] ENDOSCOPY;  Service: Endoscopy;  Laterality: N/A;  ? ESOPHAGOGASTRODUODENOSCOPY (EGD) WITH PROPOFOL N/A 05/07/2018  ? Procedure: ESOPHAGOGASTRODUODENOSCOPY (EGD) WITH PROPOFOL;  Surgeon: Lucilla Lame, MD;  Location: [facility] ENDOSCOPY;  Service: Endoscopy;  Laterality: N/A;  ? ESOPHAGOGASTRODUODENOSCOPY (EGD) WITH  PROPOFOL N/A 04/24/2019  ? Procedure: ESOPHAGOGASTRODUODENOSCOPY (EGD) WITH PROPOFOL;  Surgeon: Jonathon Bellows, MD;  Location[facility] ENDOSCOPY;  Service: Gastroenterology;  Laterality: N/A;  ? ESOPHAGOGASTRODUODENOSCOPY (EGD) WITH PROPOFOL N/A 01/12/2020  ? Procedure: ESOPHAGOGASTRODUODENOSCOPY (EGD) WITH PROPOFOL;  Surgeon: Jonathon Bellows, MD;  Location: [facility] ENDOSCOPY;  Service: Gastroenterology;  Laterality: N/A;  ? ESOPHAGOGASTRODUODENOSCOPY (EGD) WITH PROPOFOL N/A 04/28/2020  ? Procedure: ESOPHAGOGASTRODUODENOSCOPY (EGD) WITH PROPOFOL;  Surgeon: Jonathon Bellows, MD;  Location: [facility] ENDOSCOPY;  Service: Gastroenterology;  Laterality: N/A;  ? EUS N/A 05/07/2019  ? Procedure: FULL UPPER ENDOSCOPIC ULTRASOUND (EUS) RADIAL;  Surgeon: Jola Schmidt, MD;  Location: [facility] ENDOSCOPY;  Service: Endoscopy;  Laterality: N/A;  ? ILEOSCOPY N/A 07/22/2018  ? Procedure: ILEOSCOPY THROUGH STOMA;  Surgeon: Virgel Manifold, MD;  Location: [facility] ENDOSCOPY;  Service: Endoscopy;  Laterality: N/A;  ? ILEOSTOMY    ? ILEOSTOMY N/A 09/08/2018  ? Procedure: ILEOSTOMY REVISION POSSIBLE CREATION;  Surgeon: Herbert Pun, MD;  Location: [facility] ORS;  Service: General;  Laterality: N/A;  ? ILEOSTOMY CLOSURE N/A 08/15/2018  ? Procedure: DILATION OF ILEOSTOMY STRICTURE;  Surgeon: Herbert Pun, MD;  Location: [facility] ORS;  Service: General;  Laterality: N/A;  ? LAPAROTOMY Right 05/04/2018  ? Procedure: EXPLORATORY LAPAROTOMY right colectomy right and left ostomy;  Surgeon: Herbert Pun, MD;  Location: [facility] ORS;  Service: General;  Laterality: Right;  ? LEFT ATRIAL APPENDAGE OCCLUSION N/A 01/11/2021  ? Procedure: LEFT ATRIAL APPENDAGE OCCLUSION ([place]); ABORTED PROCEDURE WITHOUT DEVICE BEING IMPLANTED; Location: [facility]; Surgeon: Mylinda Latina, MD  ? LUNG BIOPSY    ? MASTECTOMY Left   ? 2000, [facility]  ? ROTATOR CUFF REPAIR Right   ? [place]  ? XI ROBOTIC ASSISTED COLOSTOMY TAKEDOWN N/A 10/23/2021  ? Procedure: XI ROBOTIC ASSISTED  ILEOSTOMY TAKEDOWN;  Surgeon: Herbert Pun, MD;  Location: [facility] ORS;  Service: General;  Laterality: N/A;  180 minutes for the surgery part please  ?  ?Medications Prior to Admission  ?Medication Sig Dispense Refill Last Dose  ? amiodarone (PACERONE) 200 MG tablet Take 1 tablet (200 mg total) by mouth 2 (two) times daily. 60 tablet 0 01/25/2022 at 2200  ? apixaban (ELIQUIS) 2.5 MG TABS tablet Take 2.5 mg by mouth 2 (two) times daily.   01/25/2022 at 2200  ? citalopram (CELEXA) 40 MG tablet Take 40 mg by mouth every morning.   01/25/2022 at 0900  ? Fluticasone-Umeclidin-Vilant 100-62.5-25 MCG/INH AEPB Inhale 1 puff into the lungs every morning.   01/25/2022 at 0900  ? metoprolol tartrate (LOPRESSOR) 25 MG tablet Take 0.5 tablets (12.5 mg total) by mouth 2 (two) times daily. 60 tablet 0 01/25/2022 at 2200  ? pantoprazole (PROTONIX) 40 MG tablet Take 1 tablet (40 mg total) by mouth 2 (two) times daily before a meal. 60 tablet 2 01/25/2022 at 1700  ? acetaminophen (TYLENOL) 325 MG tablet Take 2 tablets (650 mg total) by mouth every 6 (six) hours as needed for mild pain (or Fever >/= 101). (Patient taking differently: Take 650 mg by mouth every 4 (four) hours as needed for moderate pain.)   prn at unknown  ? albuterol (PROVENTIL HFA;VENTOLIN HFA) 108 (90 Base) MCG/ACT inhaler Inhale 2 puffs into  the lungs every 6 (six) hours as needed for wheezing or shortness of breath. 1 Inhaler 2 prn at unknown  ? calcium carbonate (TUMS - DOSED IN MG ELEMENTAL CALCIUM) 500 MG chewable tablet Chew 1 tablet by mouth daily.   prn at unknown  ? DILT-XR 240 MG 24 hr capsule Take 240 mg by mouth daily. (Patient not taking: Reported on 01/26/2022)   Not Taking  ? fidaxomicin (DIFICID) 200 MG TABS tablet Take 1 tablet (200 mg total) by mouth 2 (two) times daily. (Patient not taking: Reported on 01/26/2022) 15 tablet 0 Not Taking  ? midodrine (PROAMATINE) 5 MG tablet Take 1 tablet (5 mg total) by mouth 3 (three) times daily with meals. 60  tablet 0 prn at unknown  ? OXYGEN Inhale 2 L into the lungs as needed (shortness of breath).     ? sucralfate (CARAFATE) 1 GM/10ML suspension Take 10 mLs (1 g total) by mouth 4 (four) times daily. (Patient not tak

## 2022-01-30 NOTE — Progress Notes (Signed)
?[facility]  ?ROUNDING NOTE  ? ?Subjective:  ? ?Patient seen sitting up in bed ?Appetite improving ?Shortness of breath remains with activity ?Reports feeling better than on admission ? ?Creatinine 2.46 ? ?Objective:  ?Vital signs in last 24 hours:  ?Temp:  [97.6 ?F (36.4 ?C)-98.8 ?F (37.1 ?C)] 97.7 ?F (36.5 ?C) (04/18 2841) ?Pulse Rate:  [65-120] 117 (04/18 1141) ?Resp:  [16-20] 16 (04/18 0452) ?BP: (86-96)/(65-77) 95/71 (04/18 1141) ?SpO2:  [90 %-100 %] 99 % (04/18 1141) ? ?Weight change:  ?Filed Weights  ? 01/26/22 1830  ?Weight: 53.7 kg  ? ? ?Intake/Output: ?I/O last 3 completed shifts: ?In: 720 [P.O.:720] ?Out: 72 [Urine:75] ?  ?Intake/Output this shift: ? No intake/output data recorded. ? ?Physical Exam: ?General: NAD, resting comfortably  ?Head: Normocephalic, atraumatic. Moist oral mucosal membranes  ?Eyes: Anicteric  ?Lungs:  Basilar Crackles, normal effort  ?Heart: Irregular rate and rhythm  ?Abdomen:  Soft, nontender  ?Extremities:  no peripheral edema.  ?Neurologic: Nonfocal, moving all four extremities  ?Skin: No lesions  ?Access: none  ? ? ?Basic Metabolic Panel: ?Recent Labs  ?Lab 01/26/22 ?1312 01/27/22 ?0429 01/28/22 ?3244 01/29/22 ?0102 01/30/22 ?7253  ?NA 140 138 137 135 137  ?K 4.2 4.3 4.0 4.1 4.0  ?CL 104 105 104 104 106  ?CO2 24 23 26 24 23   ?GLUCOSE 96 81 109* 113* 97  ?BUN 32* 37* 52* 53* 60*  ?CREATININE 1.91* 2.11* 2.63* 2.90* 2.46*  ?CALCIUM 9.4 8.8* 8.5* 8.5* 8.5*  ?MG  --   --  1.5* 1.9 2.1  ?PHOS  --   --  3.8 4.1  --   ? ? ? ?Liver Function Tests: ?No results for input(s): AST, ALT, ALKPHOS, BILITOT, PROT, ALBUMIN in the last 168 hours. ?No results for input(s): LIPASE, AMYLASE in the last 168 hours. ?No results for input(s): AMMONIA in the last 168 hours. ? ?CBC: ?Recent Labs  ?Lab 01/26/22 ?1312 01/27/22 ?0429 01/28/22 ?6644 01/29/22 ?0347 01/30/22 ?4259  ?WBC 6.8 5.5 7.2 7.8 7.1  ?NEUTROABS 4.1  --   --   --   --   ?HGB 10.6* 10.4* 9.0* 9.6* 8.7*  ?HCT 32.9* 31.5*  28.7* 29.1* 27.2*  ?MCV 92.7 92.9 95.3 94.2 94.1  ?PLT 227 201 206 231 236  ? ? ? ?Cardiac Enzymes: ?No results for input(s): CKTOTAL, CKMB, CKMBINDEX, TROPONINI in the last 168 hours. ? ?BNP: ?Invalid input(s): POCBNP ? ?CBG: ?Recent Labs  ?Lab 01/26/22 ?1823 01/29/22 ?5638  ?GLUCAP 126* 142*  ? ? ? ?Microbiology: ?Results for orders placed or performed during the hospital encounter of 01/26/22  ?Resp Panel by RT-PCR (Flu A&B, Covid) Nasopharyngeal Swab     Status: None  ? Collection Time: 01/26/22  1:32 PM  ? Specimen: Nasopharyngeal Swab; Nasopharyngeal(NP) swabs in vial transport medium  ?Result Value Ref Range Status  ? SARS Coronavirus 2 by RT PCR NEGATIVE NEGATIVE Final  ?  Comment: (NOTE) ?SARS-CoV-2 target nucleic acids are NOT DETECTED. ? ?The SARS-CoV-2 RNA is generally detectable in upper respiratory ?specimens during the acute phase of infection. The lowest ?concentration of SARS-CoV-2 viral copies this assay can detect is ?138 copies/mL. A negative result does not preclude SARS-Cov-2 ?infection and should not be used as the sole basis for treatment or ?other patient management decisions. A negative result may occur with  ?improper specimen collection/handling, submission of specimen other ?than nasopharyngeal swab, presence of viral mutation(s) within the ?areas targeted by this assay, and inadequate number of viral ?copies(<138 copies/mL).  A negative result must be combined with ?clinical observations, patient history, and epidemiological ?information. The expected result is Negative. ? ?Fact Sheet for Patients:  ?EntrepreneurPulse.com.au ? ?Fact Sheet for Healthcare Providers:  ?IncredibleEmployment.be ? ?This test is no t yet approved or cleared by the [place] FDA and  ?has been authorized for detection and/or diagnosis of SARS-CoV-2 by ?FDA under an Emergency Use Authorization (EUA). This EUA will remain  ?in effect (meaning this test can be used) for the  duration of the ?COVID-19 declaration under Section 564(b)(1) of the Act, 21 ?U.S.C.section 360bbb-3(b)(1), unless the authorization is terminated  ?or revoked sooner.  ? ? ?  ? Influenza A by PCR NEGATIVE NEGATIVE Final  ? Influenza B by PCR NEGATIVE NEGATIVE Final  ?  Comment: (NOTE) ?The Xpert Xpress SARS-CoV-2/FLU/RSV plus assay is intended as an aid ?in the diagnosis of influenza from Nasopharyngeal swab specimens and ?should not be used as a sole basis for treatment. Nasal washings and ?aspirates are unacceptable for Xpert Xpress SARS-CoV-2/FLU/RSV ?testing. ? ?Fact Sheet for Patients: ?EntrepreneurPulse.com.au ? ?Fact Sheet for Healthcare Providers: ?IncredibleEmployment.be ? ?This test is not yet approved or cleared by the [place] FDA and ?has been authorized for detection and/or diagnosis of SARS-CoV-2 by ?FDA under an Emergency Use Authorization (EUA). This EUA will remain ?in effect (meaning this test can be used) for the duration of the ?COVID-19 declaration under Section 564(b)(1) of the Act, 21 U.S.C. ?section 360bbb-3(b)(1), unless the authorization is terminated or ?revoked. ? ?Performed at [facility], [place], ?[place] [address] ?  ?MRSA Next Gen by PCR, Nasal     Status: None  ? Collection Time: 01/26/22  6:26 PM  ? Specimen: Nasal Mucosa; Nasal Swab  ?Result Value Ref Range Status  ? MRSA by PCR Next Gen NOT DETECTED NOT DETECTED Final  ?  Comment: (NOTE) ?The GeneXpert MRSA Assay (FDA approved for NASAL specimens only), ?is one component of a comprehensive MRSA colonization surveillance ?program. It is not intended to diagnose MRSA infection nor to guide ?or monitor treatment for MRSA infections. ?Test performance is not FDA approved in patients less than 2 years ?old. ?Performed at [facility], [place], ?[place] [address] ?  ?Body fluid culture w Gram Stain     Status: None (Preliminary result)  ?  Collection Time: 01/28/22  9:40 AM  ? Specimen: PATH Cytology Pleural fluid  ?Result Value Ref Range Status  ? Specimen Description   Final  ?  PLEURAL ?Performed at [facility], [address] [place], [place] [address] ?  ? Special Requests   Final  ?  NONE ?Performed at [facility], [address], [place], [place] [address] ?  ? Gram Stain   Final  ?  RARE WBC PRESENT,BOTH PMN AND MONONUCLEAR ?NO ORGANISMS SEEN ?  ? Culture   Final  ?  NO GROWTH 2 DAYS ?Performed at [facility], [place] [address], [place], [place] [address] ?  ? Report Status PENDING  Incomplete  ? ? ?Coagulation Studies: ?No results for input(s): LABPROT, INR in the last 72 hours. ? ? ?Urinalysis: ?No results for input(s): COLORURINE, LABSPEC, [place], GLUCOSEU, HGBUR, BILIRUBINUR, KETONESUR, PROTEINUR, UROBILINOGEN, NITRITE, LEUKOCYTESUR in the last 72 hours. ? ?Invalid input(s): APPERANCEUR  ? ? ?Imaging: ?US Carotid Bilateral ? ?Result Date: 01/30/2022 ?CLINICAL DATA:  66 year old female with a history of stroke EXAM: BILATERAL CAROTID DUPLEX ULTRASOUND TECHNIQUE: Pearline Cables scale imaging, color Doppler and  duplex ultrasound were performed of bilateral carotid and vertebral arteries in the neck. COMPARISON:  None. FINDINGS: Criteria: Quantification of carotid stenosis is based on velocity parameters that correlate the residual internal carotid diameter with NASCET-based stenosis levels, using the diameter of the distal internal carotid lumen as the denominator for stenosis measurement. The following velocity measurements were obtained: RIGHT ICA:  Systolic 77 cm/sec, Diastolic 30 cm/sec CCA:  29 cm/sec SYSTOLIC ICA/CCA RATIO:  2.6 ECA:  27 cm/sec LEFT ICA:  Systolic 56 cm/sec, Diastolic 23 cm/sec CCA:  29 cm/sec SYSTOLIC ICA/CCA RATIO:  2.0 ECA:  28 cm/sec Right Brachial SBP: Not acquired Left Brachial SBP: Not acquired RIGHT CAROTID ARTERY: No significant calcifications of the right common carotid artery. Intermediate  waveform maintained. Moderate heterogeneous and partially calcified plaque at the right carotid bifurcation. No significant lumen shadowing. Low resistance waveform of the right ICA. No significant tortuosity. R

## 2022-01-30 NOTE — Assessment & Plan Note (Signed)
S/p US guided thoracentesis on 4/16 with 900 mL of fluid removal.  Labs sent, cytology pending ?Considering her worsening kidney failure - Lasix was held today.  We will resume tomorrow as kidney function improved ?

## 2022-01-30 NOTE — Assessment & Plan Note (Signed)
Followed by Dr. Rogue Bussing ?

## 2022-01-30 NOTE — Assessment & Plan Note (Signed)
Involving left MCA distribution with signs/symptoms of right facial droop/slurred speech which resolved ? ?CT head negative.  Patient refused MRI.  Eliquis restarted on 4/17 also added Lipitor. ?Code stroke was activated last night and seen by teleneurology ?Neurology seen.  Patient's symptoms are resolved now. 2D echo shows EF of 35 to 40%.  And carotid Dopplers shows no hemodynamically significant stenosis - she cannot get CT angio due to kidney failure and she is refusing MRI ? ? ?

## 2022-01-30 NOTE — Progress Notes (Signed)
?Progress Note ? ? ?Patient: Danielle [name] FBP:[id number] DOB: Jun 22, 1956 DOA: 01/26/2022     4 ?DOS: the patient was seen and examined on 01/30/2022 ?  ?Brief hospital course: ?66 y.o. female with medical history significant for COPD on 2 L home O2, CHF, CKD 4, remote history of breast cancer, history of lung cancer, esophageal cancer, MGUS, pression, hypertension, hyperlipidemia, A-fib on Eliquis, who presents with shortness of breath. ? ?4/15: Cardio consult for A-flutter.  Nephro consult for acute on CKD stage IV, thoracentesis tomorrow under ultrasound guidance.  Eliquis on hold.  Epistaxis-ENT evaluation if not stop by conservative management ?4/16: Status post US guided thoracentesis with removal of 900 cc of fluid ?4/17: Suspected TIA involving left MCA territory last night.  Patient refusing MRI.  Eliquis restarted.  Started statin.  Neurology seen ?4/18: Echo shows EF of 35 to 40%.  Carotid Doppler shows no hemodynamically significant stenosis.  Blood pressure running low ? ? ?Assessment and Plan: ?* Pleural effusion ?S/p US guided thoracentesis on 4/16 with 900 mL of fluid removal.  Labs sent, cytology pending ?Considering her worsening kidney failure - Lasix was held today.  We will resume tomorrow as kidney function improved ? ?TIA (transient ischemic attack) ?Involving left MCA distribution with signs/symptoms of right facial droop/slurred speech which resolved ? ?CT head negative.  Patient refused MRI.  Eliquis restarted on 4/17 also added Lipitor. ?Code stroke was activated last night and seen by teleneurology ?Neurology seen.  Patient's symptoms are resolved now. 2D echo shows EF of 35 to 40%.  And carotid Dopplers shows no hemodynamically significant stenosis - she cannot get CT angio due to kidney failure and she is refusing MRI ? ? ? ?Epistaxis ?Resolved now.  ENT input appreciated.  Eliquis restarted, 4/17 ? ?Known medical problems ?Depression-continue citalopram ?GERD-continue PPI ? ? ?Elevated  troponin ?Due to demand ischemia.  Cardiology following. ? ?CKD (chronic kidney disease), stage IV ([place]) ?Nephrology following.  Restart Lasix tomorrow 40 mg IV daily as creatinine improved ? ?Lab Results  ?Component Value Date  ? CREATININE 2.46 (H) 01/30/2022  ? CREATININE 2.90 (H) 01/29/2022  ? CREATININE 2.63 (H) 01/28/2022  ? ? ?Malignant neoplasm of lower third of esophagus (HCC) ?Follows with Dr. Rogue Bussing ? ?Atrial flutter ([place]) ?Continue amiodarone.Metoprolol with holding parameters.  Eliquis for anticoagulation ? ?HTN (hypertension) ?Continue amiodarone, metoprolol and Lasix ? ?Malignant neoplasm of upper lobe of right lung ([place]) ?Followed by Dr. Rogue Bussing ? ?Moderate COPD (chronic obstructive pulmonary disease) ([place]) ?No evidence of acute exacerbation at present, on 3 L oxygen.  Wean oxygen as able ? ? ? ? ?  ? ?Subjective: Wants to get echo done.  Feeling weak and short of breath ? ?Physical Exam: ?Vitals:  ? 01/30/22 1141 01/30/22 1200 01/30/22 1629 01/30/22 1925  ?BP: 95/71  104/82 108/81  ?Pulse: (!) 117  99 (!) 113  ?Resp:  14 20 20   ?Temp: 98.1 ?F (36.7 ?C)  (!) 97.4 ?F (36.3 ?C) 98.1 ?F (36.7 ?C)  ?TempSrc:   Oral   ?SpO2: 99%  100% 96%  ?Weight:      ?Height:      ? ?66 year old cachectic female lying in the bed in norespiratory distress ?Lungs decreased breath sounds at the bases. ?No rhonchi ?Cardiovascular S1-S2 normal, no murmur rales or gallop ?Abdomen soft, benign ?Neuro alert and oriented, nonfocal ?Skin no rash or lesion ? ?Data Reviewed: ? ?Creatinine 2.46, hemoglobin 8.7 ? ?Family Communication: Daughter was updated over phone yesterday ? ?Disposition: ?Status  is: Inpatient ?Remains inpatient appropriate because: Echo was pending earlier today.  Getting diuresed.  Weaning oxygen ? ? Planned Discharge Destination: Home with Home Health ? ? ? DVT prophylaxis-Eliquis ?Time spent: 35 minutes ? ?Author: Max Sane, MD ?01/30/2022 10:07 PM ? ?For on call review [email or web address].  ?

## 2022-01-30 NOTE — Care Management Important Message (Signed)
Important Message ? ?Patient Details  ?Name: Danielle [name] ?MRN: [id number] ?Date of Birth: Nov 24, 1955 ? ? ?Medicare Important Message Given:  Yes ? ? ? ? ?Dannette Michaelann ?01/30/2022, 11:07 AM ?

## 2022-01-30 NOTE — Assessment & Plan Note (Signed)
Nephrology following.  Restart Lasix tomorrow 40 mg IV daily as creatinine improved ? ?Lab Results  ?Component Value Date  ? CREATININE 2.46 (H) 01/30/2022  ? CREATININE 2.90 (H) 01/29/2022  ? CREATININE 2.63 (H) 01/28/2022  ? ?

## 2022-01-30 NOTE — Assessment & Plan Note (Signed)
No evidence of acute exacerbation at present, on 3 L oxygen.  Wean oxygen as able ?

## 2022-01-30 NOTE — TOC Progression Note (Signed)
Transition of Care (TOC) - Progression Note  ? ? ?Patient Details  ?Name: Danielle [name] ?MRN: [id number] ?Date of Birth: 12/20/1955 ? ?Transition of Care (TOC) CM/SW Contact  ?Candie Chroman, LCSW ?Phone Number: ?01/30/2022, 1:07 PM ? ?Clinical Narrative:  Patient is still unsure about home health. She wants to think about it. Gave CMS scores for agencies that serve her zip code. PT recommending a RW but she said she already has one. ? ?Expected Discharge Plan: Home/Self Care ?Barriers to Discharge: Continued Medical Work up ? ?Expected Discharge Plan and Services ?Expected Discharge Plan: Home/Self Care ?  ?  ?Post Acute Care Choice:  (TBD) ?Living arrangements for the past 2 months: [place] ?                ?  ?  ?  ?  ?  ?  ?  ?  ?  ?  ? ? ?Social Determinants of Health (SDOH) Interventions ?  ? ?Readmission Risk Interventions ? ?  01/29/2022  ? 11:36 AM 11/29/2021  ? 10:49 AM 10/16/2019  ?  3:20 PM  ?Readmission Risk Prevention Plan  ?Transportation Screening Complete Complete Complete  ?Medication Review Press photographer) Complete Complete Referral to Pharmacy  ?PCP or Specialist appointment within 3-5 days of discharge Complete Complete   ?[place] or Home Care Consult  Complete Complete  ?SW Recovery Care/Counseling Consult Complete Complete Complete  ?Palliative Care Screening Not Applicable Not Applicable Not Applicable  ?[place] Not Applicable Not Applicable Not Applicable  ? ? ?

## 2022-01-30 NOTE — Assessment & Plan Note (Signed)
Continue amiodarone, metoprolol and Lasix ?

## 2022-01-30 NOTE — Assessment & Plan Note (Signed)
Resolved now.  ENT input appreciated.  Eliquis restarted, 4/17 ?

## 2022-01-30 NOTE — Progress Notes (Signed)
Mobility Specialist - Progress Note ? ? 01/30/22 1600  ?Mobility  ?Activity Refused mobility  ? ? ? ?Pt declined d/t recent ambulation with OT. Will attempt another date/time.  ? ? ?Kathee Delton ?Mobility Specialist ?01/30/22, 4:00 PM ? ? ? ? ?

## 2022-01-30 NOTE — Consult Note (Signed)
?Consultation Note ?Date: 01/30/2022  ? ?Patient Name: Danielle [name]  ?DOB: 1956-05-09  MRN: [id number]  Age / Sex: 66 y.o., female  ?PCP: [name], [place] ?Referring Physician: Max Sane, MD ? ?Reason for Consultation: Establishing goals of care ? ?HPI/Patient Profile: 66 y.o. female  with past medical history of  COPD on 2 L home O2, CHF, CKD 4, remote history of breast cancer, history of lung cancer, esophageal cancer, MGUS, depression, hypertension, hyperlipidemia, and A-fib on Eliquis admitted on 01/26/2022 with shortness of breath.  Patient found to have pleural effusion and had thoracentesis 4/16 with 900 mL of fluid removed.  Also concerns of TIA during hospitalization.  Patient refused MRI.  Patient also with CKD and worsening creatinine so Lasix has been held.  Patient heart rate during hospitalization and started on amiodarone.  PMT consulted to discuss goals of care. ?Clinical Assessment and Goals of Care: ?I have reviewed medical records including EPIC notes, labs and imaging, assessed the patient and then met with patient to discuss diagnosis prognosis, GOC, EOL wishes, disposition and options. ? ?I introduced Palliative Medicine as specialized medical care for people living with serious illness. It focuses on providing relief from the symptoms and stress of a serious illness. The goal is to improve quality of life for both the patient and the family. ? ?We discussed a brief life review of the patient.  Patient tells me her son and granddaughter live with her. ? ?As far as functional and nutritional status patient tells me she is independent at baseline.  She tells me she has had a poor appetite since February of this year and tells me she has been losing weight.  Her oncologist is aware of this. ? ? We discussed patient's current illness and what it means in the larger context of patient's on-going co-morbidities.  We discussed difficulties with her  heart rate and rhythm, we discussed her kidney injury, labile blood pressure, and ongoing shortness of breath.  She tells me she feels much better than she did when she was admitted however she still has shortness of breath with exertion. ? ?I attempted to elicit values and goals of care important to the patient.   ? ?The difference between aggressive medical intervention and comfort care was considered in light of the patient's goals of care.  Patient is interested in continuing an aggressive care path.  She tells me she is interested in any interventions offered to prolong her life including CPR and mechanical ventilation. ? ?Patient shares if she were ever unable to make medical decisions for herself she would want her daughter [name] to make those decisions for her.  She is interested in completing advanced directives to name her daughter as healthcare power of attorney while she is hospitalized. ? ?Discussed with patient the importance of continued conversation with family and the medical providers regarding overall plan of care and treatment options, ensuring decisions are within the context of the patient?s values and GOCs. ? ?Patient denies symptom management concerns. ? ?Questions and concerns were addressed. The family was encouraged to call with questions or concerns. ? ?Primary Decision Maker ?PATIENT ?  ? ?SUMMARY OF RECOMMENDATIONS   ?Continue full code/full scope interventions ?Spiritual care consult to assist with naming daughter as HCPOA ? ?Code Status/Advance Care Planning: ?Full code ? ?Additional Recommendations (Limitations, Scope, Preferences): ?Full Scope Treatment ? ?Psycho-social/Spiritual:  ?Desire for further Chaplaincy support:yes ? ?  ? ?Primary Diagnoses: ?Present on Admission: ? Pleural effusion ? Moderate COPD (chronic  obstructive pulmonary disease) ([place]) ? Breast cancer in female [facility]) ? Malignant neoplasm of upper lobe of right lung ([place]) ? Chronic systolic heart failure ([place]) ?  HTN (hypertension) ? Atrial flutter ([place]) ? Malignant neoplasm of lower third of esophagus (HCC) ? Elevated troponin ? Known medical problems ? CKD (chronic kidney disease), stage IV ([place]) ? ? ?I have reviewed the medical record, interviewed the patient and family, and examined the patient. The following aspects are pertinent. ? ?Past Medical History:  ?Diagnosis Date  ? Anemia   ? Anxiety   ? Aortic atherosclerosis ([place])   ? Aortic valve stenosis 02/10/2018  ? a.) TTE 02/10/2018: EF 55-60%: mild AS with MPG of 12 mmHg. b.) TTE 04/21/2018: EF 35-40%; mild AS with MPG 8 mmHg. c.) TTE 11/07/2019: EF 50-55%; mild AS with MPG 10 mmHg.  ? Arthritis   ? Atrial fibrillation ([place])   ? a.) CHA2DS2-VASc = 4 (age, sex, HTN, aortic plaque). b.) Rate/rhythm maintained on oral carvedilol; chronically anticoagulated on dose reduced apixaban. c.)  Attempted deployment of LAA occlusive device on 01/11/2021; parameters failed and procedure aborted.  ? Breast cancer, left ([place]) 2000  ? a.) T2N1M0; ER/PR (+) --> Tx'd with total mastectomy, LN resection, XRT, and chemotherapy  ? Cancer of right lung ([place]) 07/30/2016  ? a.) adenocarcinoma; ALK, ROS1, PDL1, BRAF, EGFR all negative.  ? Chronic diastolic CHF (congestive heart failure) ([place]) 11/26/2021  ? CKD (chronic kidney disease), stage IV ([place])   ? COPD (chronic obstructive pulmonary disease) ([place])   ? Dependence on supplemental oxygen   ? Depression   ? Diastolic dysfunction 69/48/5462  ? a.) TTE 02/10/2017: EF 55-60%; G2DD. b.) TTE 04/21/2018: EF 35-40%; mild LA dilation; mod MV regurgitation. c.) TTE 11/07/2019: EF 50-55%; G1DD.  ? DOE (dyspnea on exertion)   ? GERD (gastroesophageal reflux disease)   ? Heart murmur   ? History of 2019 novel coronavirus disease (COVID-19) 10/14/2019  ? HLD (hyperlipidemia)   ? Hypertension   ? Long term current use of anticoagulant   ? a.) apixaban  ? Lymphedema   ? Personal history of chemotherapy   ? Personal history of radiation therapy   ? SBO  (small bowel obstruction) ([place]) 11/05/2020  ? Vitamin D deficiency   ? ?Social History  ? ?Socioeconomic History  ? Marital status: Divorced  ?  Spouse name: Not on file  ? Number of children: 3  ? Years of education: Not on file  ? Highest education level: Not on file  ?Occupational History  ? Occupation: Retired   ?Tobacco Use  ? Smoking status: Former  ?  Packs/day: 0.50  ?  Years: 20.00  ?  Pack years: 10.00  ?  Types: Cigarettes  ?  Quit date: 07/02/2012  ?  Years since quitting: 9.5  ? Smokeless tobacco: Current  ?  Types: Snuff  ? Tobacco comments:  ?  quit 2014  ?Vaping Use  ? Vaping Use: Never used  ?Substance and Sexual Activity  ? Alcohol use: Yes  ?  Comment: Occasionally beer  ? Drug use: No  ? Sexual activity: Not Currently  ?Other Topics Concern  ? Not on file  ?Social History Narrative  ? Not on file  ? ?Social Determinants of Health  ? ?Financial Resource Strain: Not on file  ?Food Insecurity: Not on file  ?Transportation Needs: Not on file  ?Physical Activity: Not on file  ?Stress: Not on file  ?Social Connections: Not on file  ? ?Family  History  ?Problem Relation Age of Onset  ? Breast cancer Mother 24  ? Cancer Mother   ?     Breast   ? Cirrhosis Father   ? Breast cancer Paternal Aunt 15  ? Cancer Maternal Aunt   ?     Breast   ? ?Scheduled Meds: ? amiodarone  200 mg Oral BID  ? apixaban  2.5 mg Oral BID  ? atorvastatin  40 mg Oral Daily  ? Chlorhexidine Gluconate Cloth  6 each Topical Daily  ? citalopram  20 mg Oral Daily  ? feeding supplement (NEPRO CARB STEADY)  237 mL Oral TID BM  ? metoprolol tartrate  12.5 mg Oral Q6H  ? multivitamin with minerals  1 tablet Oral Daily  ? pantoprazole  40 mg Oral BID AC  ? sodium chloride flush  3 mL Intravenous Q12H  ? ?Continuous Infusions: ?PRN Meds:.acetaminophen **OR** acetaminophen, guaiFENesin-dextromethorphan, ondansetron **OR** ondansetron (ZOFRAN) IV, oxymetazoline, phenol, polyethylene glycol, zolpidem ?No Known Allergies ?Review of Systems   ?Constitutional:  Positive for activity change and appetite change.  ?HENT:  Positive for sore throat.   ?Respiratory:  Positive for shortness of breath.   ? ?Physical Exam ?Constitutional:   ?   General: She is no

## 2022-01-30 NOTE — Progress Notes (Signed)
*  PRELIMINARY RESULTS* ?Echocardiogram ?2D Echocardiogram has been performed. ? ?Danielle [name], [place] ?01/30/2022, 8:18 AM ?

## 2022-01-30 NOTE — Progress Notes (Signed)
ROS per bedside 0645 ?

## 2022-01-30 NOTE — Assessment & Plan Note (Signed)
Depression-continue citalopram ?GERD-continue PPI ? ?

## 2022-01-30 NOTE — Assessment & Plan Note (Signed)
Continue amiodarone.Metoprolol with holding parameters.  Eliquis for anticoagulation ?

## 2022-01-31 DIAGNOSIS — J90 Pleural effusion, not elsewhere classified: Secondary | ICD-10-CM | POA: Diagnosis not present

## 2022-01-31 LAB — CBC
HCT: 28.1 % — ABNORMAL LOW (ref 36.0–46.0)
Hemoglobin: 8.8 g/dL — ABNORMAL LOW (ref 12.0–15.0)
MCH: 30 pg (ref 26.0–34.0)
MCHC: 31.3 g/dL (ref 30.0–36.0)
MCV: 95.9 fL (ref 80.0–100.0)
Platelets: 242 10*3/uL (ref 150–400)
RBC: 2.93 MIL/uL — ABNORMAL LOW (ref 3.87–5.11)
RDW: 16.8 % — ABNORMAL HIGH (ref 11.5–15.5)
WBC: 6.1 10*3/uL (ref 4.0–10.5)
nRBC: 0.3 % — ABNORMAL HIGH (ref 0.0–0.2)

## 2022-01-31 LAB — BASIC METABOLIC PANEL
Anion gap: 6 (ref 5–15)
BUN: 53 mg/dL — ABNORMAL HIGH (ref 8–23)
CO2: 23 mmol/L (ref 22–32)
Calcium: 8.6 mg/dL — ABNORMAL LOW (ref 8.9–10.3)
Chloride: 108 mmol/L (ref 98–111)
Creatinine, Ser: 2.13 mg/dL — ABNORMAL HIGH (ref 0.44–1.00)
GFR, Estimated: 25 mL/min — ABNORMAL LOW (ref >60)
Glucose, Bld: 97 mg/dL (ref 70–99)
Potassium: 4.4 mmol/L (ref 3.5–5.1)
Sodium: 137 mmol/L (ref 135–145)

## 2022-01-31 LAB — CYTOLOGY - NON PAP

## 2022-01-31 NOTE — Progress Notes (Signed)
? ? ? ?PULMONOLOGY ? ? ? ? ? ? ? ? ?Date: 01/31/2022,   ?MRN# [id number] Danielle [name] 1956-10-09 ? ? ?  ?AdmissionWeight: 53.7 kg                 ?CurrentWeight: 53.7 kg ? ?Referring provider: Dr Dione Plover ? ? ?CHIEF COMPLAINT:  ? ?Acute on chronic hypoxemic respiratory failure ? ? ?HISTORY OF PRESENT ILLNESS  ? ?This is a 66 yo F with PMH as below with significant hx of advanced COPD and chronic hypoxemia on supplemental O2 at home 2l/minnc wth CKD/CHF overlap, hx of breast and lung ca s/p SBRT, MDD, HTD, dyslipidemia, AF on eliquis who came in with DOE and worsening sob at rest.  She had Cdiff infection recently. Patient does have stable vital signs but is with significant abnormalities on labwork including markedly elevated BNP, and abronmal CT chest with compressive atelectasis of right lung with sorrounding moderate pleural effusion.  ? ?01/31/22- patient reports worsening sob. She is stable , will repeat cxr to eval for reaccumualtion of right pleural effusion  ? ?PAST MEDICAL HISTORY  ? ?Past Medical History:  ?Diagnosis Date  ? Anemia   ? Anxiety   ? Aortic atherosclerosis ([place])   ? Aortic valve stenosis 02/10/2018  ? a.) TTE 02/10/2018: EF 55-60%: mild AS with MPG of 12 mmHg. b.) TTE 04/21/2018: EF 35-40%; mild AS with MPG 8 mmHg. c.) TTE 11/07/2019: EF 50-55%; mild AS with MPG 10 mmHg.  ? Arthritis   ? Atrial fibrillation ([place])   ? a.) CHA2DS2-VASc = 4 (age, sex, HTN, aortic plaque). b.) Rate/rhythm maintained on oral carvedilol; chronically anticoagulated on dose reduced apixaban. c.)  Attempted deployment of LAA occlusive device on 01/11/2021; parameters failed and procedure aborted.  ? Breast cancer, left ([place]) 2000  ? a.) T2N1M0; ER/PR (+) --> Tx'd with total mastectomy, LN resection, XRT, and chemotherapy  ? Cancer of right lung ([place]) 07/30/2016  ? a.) adenocarcinoma; ALK, ROS1, PDL1, BRAF, EGFR all negative.  ? Chronic diastolic CHF (congestive heart failure) ([place]) 11/26/2021  ? CKD (chronic kidney  disease), stage IV ([place])   ? COPD (chronic obstructive pulmonary disease) ([place])   ? Dependence on supplemental oxygen   ? Depression   ? Diastolic dysfunction 54/27/0623  ? a.) TTE 02/10/2017: EF 55-60%; G2DD. b.) TTE 04/21/2018: EF 35-40%; mild LA dilation; mod MV regurgitation. c.) TTE 11/07/2019: EF 50-55%; G1DD.  ? DOE (dyspnea on exertion)   ? GERD (gastroesophageal reflux disease)   ? Heart murmur   ? History of 2019 novel coronavirus disease (COVID-19) 10/14/2019  ? HLD (hyperlipidemia)   ? Hypertension   ? Long term current use of anticoagulant   ? a.) apixaban  ? Lymphedema   ? Personal history of chemotherapy   ? Personal history of radiation therapy   ? SBO (small bowel obstruction) ([place]) 11/05/2020  ? Vitamin D deficiency   ? ? ? ?SURGICAL HISTORY  ? ?Past Surgical History:  ?Procedure Laterality Date  ? Breast Biospy Left   ? [place]  ? BREAST SURGERY    ? COLONOSCOPY N/A 04/30/2018  ? Procedure: COLONOSCOPY;  Surgeon: Virgel Manifold, MD;  Location: [facility] ENDOSCOPY;  Service: Endoscopy;  Laterality: N/A;  ? COLONOSCOPY N/A 07/22/2018  ? Procedure: COLONOSCOPY;  Surgeon: Virgel Manifold, MD;  Location: [facility] ENDOSCOPY;  Service: Endoscopy;  Laterality: N/A;  ? COLONOSCOPY WITH PROPOFOL N/A 09/21/2021  ? Procedure: COLONOSCOPY WITH PROPOFOL;  Surgeon: Benjamine Sprague, DO;  Location: [facility]  ENDOSCOPY;  Service: General;  Laterality: N/A;  ? DILATION AND CURETTAGE OF UTERUS    ? ELECTROMAGNETIC NAVIGATION BROCHOSCOPY Right 04/11/2016  ? Procedure: ELECTROMAGNETIC NAVIGATION BRONCHOSCOPY;  Surgeon: Vilinda Boehringer, MD;  Location: [facility] ORS;  Service: Cardiopulmonary;  Laterality: Right;  ? ESOPHAGOGASTRODUODENOSCOPY N/A 07/22/2018  ? Procedure: ESOPHAGOGASTRODUODENOSCOPY (EGD);  Surgeon: Virgel Manifold, MD;  Location: [facility] ENDOSCOPY;  Service: Endoscopy;  Laterality: N/A;  ? ESOPHAGOGASTRODUODENOSCOPY (EGD) WITH PROPOFOL N/A 05/07/2018  ? Procedure: ESOPHAGOGASTRODUODENOSCOPY (EGD) WITH PROPOFOL;   Surgeon: Lucilla Lame, MD;  Location: [facility] ENDOSCOPY;  Service: Endoscopy;  Laterality: N/A;  ? ESOPHAGOGASTRODUODENOSCOPY (EGD) WITH PROPOFOL N/A 04/24/2019  ? Procedure: ESOPHAGOGASTRODUODENOSCOPY (EGD) WITH PROPOFOL;  Surgeon: Jonathon Bellows, MD;  Location: [facility] ENDOSCOPY;  Service: Gastroenterology;  Laterality: N/A;  ? ESOPHAGOGASTRODUODENOSCOPY (EGD) WITH PROPOFOL N/A 01/12/2020  ? Procedure: ESOPHAGOGASTRODUODENOSCOPY (EGD) WITH PROPOFOL;  Surgeon: Jonathon Bellows, MD;  Location: [facility] ENDOSCOPY;  Service: Gastroenterology;  Laterality: N/A;  ? ESOPHAGOGASTRODUODENOSCOPY (EGD) WITH PROPOFOL N/A 04/28/2020  ? Procedure: ESOPHAGOGASTRODUODENOSCOPY (EGD) WITH PROPOFOL;  Surgeon: Jonathon Bellows, MD;  Location: [facility] ENDOSCOPY;  Service: Gastroenterology;  Laterality: N/A;  ? EUS N/A 05/07/2019  ? Procedure: FULL UPPER ENDOSCOPIC ULTRASOUND (EUS) RADIAL;  Surgeon: Jola Schmidt, MD;  Location: [facility] ENDOSCOPY;  Service: Endoscopy;  Laterality: N/A;  ? ILEOSCOPY N/A 07/22/2018  ? Procedure: ILEOSCOPY THROUGH STOMA;  Surgeon: Virgel Manifold, MD;  Location: [facility] ENDOSCOPY;  Service: Endoscopy;  Laterality: N/A;  ? ILEOSTOMY    ? ILEOSTOMY N/A 09/08/2018  ? Procedure: ILEOSTOMY REVISION POSSIBLE CREATION;  Surgeon: Herbert Pun, MD;  Location: [facility] ORS;  Service: General;  Laterality: N/A;  ? ILEOSTOMY CLOSURE N/A 08/15/2018  ? Procedure: DILATION OF ILEOSTOMY STRICTURE;  Surgeon: Herbert Pun, MD;  Location: [facility] ORS;  Service: General;  Laterality: N/A;  ? LAPAROTOMY Right 05/04/2018  ? Procedure: EXPLORATORY LAPAROTOMY right colectomy right and left ostomy;  Surgeon: Herbert Pun, MD;  Location: [facility] ORS;  Service: General;  Laterality: Right;  ? LEFT ATRIAL APPENDAGE OCCLUSION N/A 01/11/2021  ? Procedure: LEFT ATRIAL APPENDAGE OCCLUSION ([place]); ABORTED PROCEDURE WITHOUT DEVICE BEING IMPLANTED; Location: [facility]; Surgeon: Mylinda Latina, MD  ? LUNG BIOPSY    ? MASTECTOMY Left   ? 2000,  [facility]  ? ROTATOR CUFF REPAIR Right   ? [place]  ? XI ROBOTIC ASSISTED COLOSTOMY TAKEDOWN N/A 10/23/2021  ? Procedure: XI ROBOTIC ASSISTED ILEOSTOMY TAKEDOWN;  Surgeon: Herbert Pun, MD;  Location: [facility] ORS;  Service: General;  Laterality: N/A;  180 minutes for the surgery part please  ? ? ? ?FAMILY HISTORY  ? ?Family History  ?Problem Relation Age of Onset  ? Breast cancer Mother 26  ? Cancer Mother   ?     Breast   ? Cirrhosis Father   ? Breast cancer Paternal Aunt 16  ? Cancer Maternal Aunt   ?     Breast   ? ? ? ?SOCIAL HISTORY  ? ?Social History  ? ?Tobacco Use  ? Smoking status: Former  ?  Packs/day: 0.50  ?  Years: 20.00  ?  Pack years: 10.00  ?  Types: Cigarettes  ?  Quit date: 07/02/2012  ?  Years since quitting: 9.5  ? Smokeless tobacco: Current  ?  Types: Snuff  ? Tobacco comments:  ?  quit 2014  ?Vaping Use  ? Vaping Use: Never used  ?Substance Use Topics  ? Alcohol use: Yes  ?  Comment: Occasionally beer  ? Drug use: No  ? ? ? ?MEDICATIONS  ? ? ?  Home Medication:  ?  ?Current Medication: ? ?Current Facility-Administered Medications:  ?  acetaminophen (TYLENOL) tablet 650 mg, 650 mg, Oral, Q6H PRN, 650 mg at 01/31/22 0823 **OR** acetaminophen (TYLENOL) suppository 650 mg, 650 mg, Rectal, Q6H PRN, Clarnce Flock, MD ?  amiodarone (PACERONE) tablet 200 mg, 200 mg, Oral, BID, Clarnce Flock, MD, 200 mg at 01/31/22 8016 ?  apixaban (ELIQUIS) tablet 2.5 mg, 2.5 mg, Oral, BID, Max Sane, MD, 2.5 mg at 01/31/22 0804 ?  atorvastatin (LIPITOR) tablet 40 mg, 40 mg, Oral, Daily, Max Sane, MD, 40 mg at 01/30/22 2215 ?  Chlorhexidine Gluconate Cloth 2 % PADS 6 each, 6 each, Topical, Daily, Clarnce Flock, MD, 6 each at 01/31/22 0804 ?  citalopram (CELEXA) tablet 20 mg, 20 mg, Oral, Daily, Beers, Shanon Brow, RPH, 20 mg at 01/31/22 5537 ?  feeding supplement (NEPRO CARB STEADY) liquid 237 mL, 237 mL, Oral, TID BM, Max Sane, MD, 237 mL at 01/31/22 0804 ?  furosemide (LASIX) injection 40 mg, 40 mg,  Intravenous, Daily, Max Sane, MD, 40 mg at 01/31/22 0803 ?  guaiFENesin-dextromethorphan (ROBITUSSIN DM) 100-10 MG/5ML syrup 5 mL, 5 mL, Oral, Q4H PRN, Clarnce Flock, MD, 5 mL at 01/30/22 2324 ?  metoprolol

## 2022-01-31 NOTE — Assessment & Plan Note (Signed)
Nephrology following.   ?Renal function continues to improve after resumption of Lasix. ?-- Continue Lasix 40 mg daily ? ?

## 2022-01-31 NOTE — Assessment & Plan Note (Addendum)
No evidence of acute exacerbation at present, on 3 L oxygen.  Wean oxygen as able.  Pulmonology following, see their recommendations ?

## 2022-01-31 NOTE — Assessment & Plan Note (Signed)
Followed by Dr. Rogue Bussing ?

## 2022-01-31 NOTE — Assessment & Plan Note (Signed)
Due to demand ischemia.   ?Cardiology following. ?

## 2022-01-31 NOTE — Assessment & Plan Note (Signed)
S/p US guided thoracentesis on 4/16 with 900 mL of fluid removal.  Labs sent, cytology pending ?Now resumed on Lasix. ?

## 2022-01-31 NOTE — Progress Notes (Signed)
Pt c/o right blurred vision onset this am.... has bilateral equal upper and lower extremity strength... no slurred speech or droopy facial.... A&O x4... No acute distress... Notified Neomia Glass NP ?

## 2022-01-31 NOTE — Progress Notes (Signed)
Physical Therapy Treatment ?Patient Details ?Name: Danielle [name] ?MRN: [id number] ?DOB: January 01, 1956 ?Today's Date: 01/31/2022 ? ? ?History of Present Illness 66 y.o.  female with medical problems of advanced COPD on chronic oxygen, chronic kidney disease, atrial fibrillation, mild aortic stenosis, history of lung cancer, history of esophageal cancer, MGUS, hyperlipidemia was admitted for shortness of breath was admitted on 01/26/2022 for Pleural effusion. ? ?  ?PT Comments  ? ? Physical Therapy Treatment completed this date. Upon entry into room patient was supine in bed with no pain reported, and O2 via [place] not donned. Patient reported she took it out to eat. SpO2 at 85%, and [place] donned. Throughout session patient demonstrated facial grimacing, specifically when placing gait belt around the abdomen. Patient completed all bed mobility at Mod I, and completed a stand pivot transfer from EOB<>BSC at [facility] with no AD present. Pericare was completed independently. With encouragement, patient agreed to ambulate out in the hallway (~170ft) with RW and decreased speed. SBA/SUP was required, and ~38feet patient reported increased fatigue and weakness, however no LOB or unsteadiness noted. SpO2 dropped to 84% with functional activity, however once seated and cueing for pursed lip breathing, SpO2 increased to 92%. Patient was left in bed with all needs met and in reach. Patient would continue to benefit from skilled physical therapy in order to optimize patient's return to PLOF. Continue to recommend HHPT once discharged from acute hospitalization.  ?  ?Recommendations for follow up therapy are one component of a multi-disciplinary discharge planning process, led by the attending physician.  Recommendations may be updated based on patient status, additional functional criteria and insurance authorization. ? ?Follow Up Recommendations ? Home health PT ?  ?  ?Assistance Recommended at Discharge Frequent or constant  Supervision/Assistance  ?Patient can return home with the following A little help with walking and/or transfers;A little help with bathing/dressing/bathroom;Help with stairs or ramp for entrance;Assist for transportation ?  ?Equipment Recommendations ? Rolling walker (2 wheels)  ?  ?Recommendations for Other Services   ? ? ?  ?Precautions / Restrictions Precautions ?Precautions: Fall ?Restrictions ?Weight Bearing Restrictions: No  ?  ? ?Mobility ? Bed Mobility ?Overal bed mobility: Modified Independent ?  ?  ?  ?  ?  ?  ?General bed mobility comments: no physical assistance ?Patient Response: Cooperative, Flat affect ? ?Transfers ?Overall transfer level: Needs assistance ?Equipment used: None ?Transfers: Sit to/from Stand, Bed to chair/wheelchair/BSC ?Sit to Stand: Supervision ?  ?Step pivot transfers: Supervision (from EOB <>BSC at supervision, patient demonstrated Mod I with all pericare) ?  ?  ?  ?  ?  ? ?Ambulation/Gait ?Ambulation/Gait assistance: Supervision (SBA) ?Gait Distance (Feet): 100 Feet ?Assistive device: Rolling walker (2 wheels) ?Gait Pattern/deviations: Step-through pattern, Decreased step length - right, Decreased step length - left, Decreased stride length, Narrow base of support ?Gait velocity: decreased ?  ?  ?General Gait Details: ~54feet patient reported increased fatigue and weakness in the legs, no LOB or unsteadiness noted. ? ? ?Stairs ?  ?  ?  ?  ?  ? ? ?Wheelchair Mobility ?  ? ?Modified Rankin (Stroke Patients Only) ?  ? ? ?  ?Balance Overall balance assessment: Needs assistance ?Sitting-balance support: No upper extremity supported, Feet supported ?Sitting balance-Leahy Scale: Good ?Sitting balance - Comments: good sitting balance reaching within BOS ?  ?Standing balance support: Single extremity supported, During functional activity, Reliant on assistive device for balance ?Standing balance-Leahy Scale: Fair ?Standing balance comment: Reliant on AD for balance ?  ?  ?  ?  ?  ?  ?  ?   ?  ?  ?  ?  ? ?  ?  Cognition Arousal/Alertness: Awake/alert ?Behavior During Therapy: [facility] for tasks assessed/performed ?Overall Cognitive Status: Within Functional Limits for tasks assessed ?  ?  ?  ?  ?  ?  ?  ?  ?  ?  ?  ?  ?  ?  ?  ?  ?  ?  ?  ? ?  ?Exercises   ? ?  ?General Comments General comments (skin integrity, edema, etc.): SpO2: 85% on room air, 84-93% on 2L O2 via [place], HR: 99-123bpm ?  ?  ? ?Pertinent Vitals/Pain Pain Assessment ?Pain Assessment: No/denies pain ?Faces Pain Scale: Hurts little more ?Pain Location: Does not state specific location ?Pain Descriptors / Indicators: Aching, Discomfort ?Pain Intervention(s): Monitored during session  ? ? ?Home Living   ?  ?  ?  ?  ?  ?  ?  ?  ?  ?   ?  ?Prior Function    ?  ?  ?   ? ?PT Goals (current goals can now be found in the care plan section) Acute Rehab PT Goals ?Patient Stated Goal: to have decreased ?PT Goal Formulation: With patient ?Time For Goal Achievement: 02/12/22 ?Potential to Achieve Goals: Good ?Progress towards PT goals: Progressing toward goals ? ?  ?Frequency ? ? ? Min 2X/week ? ? ? ?  ?PT Plan Current plan remains appropriate  ? ? ?Co-evaluation   ?  ?  ?  ?  ? ?  ?AM-PAC PT "6 Clicks" Mobility   ?Outcome Measure ? Help needed turning from your back to your side while in a flat bed without using bedrails?: A Little ?Help needed moving from lying on your back to sitting on the side of a flat bed without using bedrails?: A Little ?Help needed moving to and from a bed to a chair (including a wheelchair)?: A Little ?Help needed standing up from a chair using your arms (e.g., wheelchair or bedside chair)?: A Little ?Help needed to walk in hospital room?: A Little ?Help needed climbing 3-5 steps with a railing? : A Little ?6 Click Score: 18 ? ?  ?End of Session Equipment Utilized During Treatment: Gait belt;Oxygen ?Activity Tolerance: Patient limited by fatigue ?Patient left: in bed;with call bell/phone within reach;with bed alarm set ?Nurse  Communication: Mobility status ?PT Visit Diagnosis: Other abnormalities of gait and mobility (R26.89);Difficulty in walking, not elsewhere classified (R26.2);Muscle weakness (generalized) (M62.81) ?  ? ? ?Time: 1101-1130 ?PT Time Calculation (min) (ACUTE ONLY): 29 min ? ?Charges:  $Gait Training: 8-22 mins ?$Therapeutic Activity: 8-22 mins          ?          ? ?Iva Boop, PT  ?01/31/22. 12:23 PM ? ? ?

## 2022-01-31 NOTE — Assessment & Plan Note (Signed)
Continue amiodarone, metoprolol and Lasix ?

## 2022-01-31 NOTE — Assessment & Plan Note (Signed)
Follow up outpatient as scheduled. ?

## 2022-01-31 NOTE — Progress Notes (Signed)
?Progress Note ? ? ?Patient: Danielle [name] OBS:[id number] DOB: 06/25/1956 DOA: 01/26/2022     5 ?DOS: the patient was seen and examined on 01/31/2022 ?  ?Brief hospital course: ?66 y.o. female with medical history significant for COPD on 2 L home O2, CHF, CKD 4, remote history of breast cancer, history of lung cancer, esophageal cancer, MGUS, pression, hypertension, hyperlipidemia, A-fib on Eliquis, who presents with shortness of breath. ? ?4/15: Cardio consult for A-flutter.  Nephro consult for acute on CKD stage IV, thoracentesis tomorrow under ultrasound guidance.  Eliquis on hold.  Epistaxis-ENT evaluation if not stop by conservative management ?4/16: Status post US guided thoracentesis with removal of 900 cc of fluid ?4/17: Suspected TIA involving left MCA territory last night.  Patient refusing MRI.  Eliquis restarted.  Started statin.  Seen by Neurology. ?4/18: Echo shows EF of 35 to 40%.  Carotid Doppler shows no hemodynamically significant stenosis.  Blood pressure running low. ?4/19: R eye blurred vision reported this AM. Pt continues to decline to have MRI.  Improved later this AM.  No other focal neurologic symptoms.  Cr continues to improve. ? ?Assessment and Plan: ?* Pleural effusion ?S/p US guided thoracentesis on 4/16 with 900 mL of fluid removal.  Labs sent, cytology pending ?Now resumed on Lasix. ? ?TIA (transient ischemic attack) ?Involving left MCA distribution with signs/symptoms of right facial droop/slurred speech which resolved. ?CT head negative.   ?Patient refused MRI.   ?Eliquis restarted on 4/17, added Lipitor. ? ?Code stroke was activated night of 4/17-18 and seen by teleneurology, then Neurology as well.   ? ?Symptoms are resolved now.  ?4/19 some R vision blurring reported early AM, improved by rounds.  She continues to decline MRI. ? ?2D echo shows EF of 35 to 40%.   ?Carotid Dopplers shows no hemodynamically significant stenosis. ?Cannot get CT angio due to kidney failure and she is  refusing MRI. ? ? ? ?Epistaxis ?Resolved now.  ENT input appreciated.  Eliquis restarted, 4/17 ? ?Known medical problems ?Depression-continue citalopram ?GERD-continue PPI ? ? ?Elevated troponin ?Due to demand ischemia.   ?Cardiology following. ? ?CKD (chronic kidney disease), stage IV ([place]) ?Nephrology following.   ?Renal function continues to improve after resumption of Lasix. ?-- Continue Lasix 40 mg daily ? ? ?Malignant neoplasm of lower third of esophagus ([facility]) ?Follows with Dr. Rogue Bussing ? ?Atrial flutter ([place]) ?Continue amiodarone. ?Metoprolol with holding parameters.  Eliquis for anticoagulation. ? ?HTN (hypertension) ?Continue amiodarone, metoprolol and Lasix ? ?Chronic systolic heart failure ([place]) ?Echo 01/30/22: EF 35 to 40% with regional WMA's, grade 2 diastolic dysfunction, moderate MR, moderate to severe TR, moderate AS. ?-- Continue IV Lasix ?-- Monitor renal function electrolytes ?-- Strict I/O's and daily weights ?-- Follow cardiology recommendations ? ?Malignant neoplasm of upper lobe of right lung ([place]) ?Followed by Dr. Rogue Bussing ? ?Breast cancer in female [facility]) ?Follow up outpatient as scheduled. ? ?Moderate COPD (chronic obstructive pulmonary disease) ([place]) ?No evidence of acute exacerbation at present, on 3 L oxygen.  Wean oxygen as able ? ? ? ? ?  ? ?Subjective: Patient awake sitting up in recliner when seen this morning.  She reports the blurriness in her right eye she noted when waking up this morning has significantly improved by time of my encounter.  She overall is feeling better and has no other acute complaints at this time. ? ?Physical Exam: ?Vitals:  ? 01/31/22 0400 01/31/22 0802 01/31/22 1122 01/31/22 1533  ?BP:  101/75 107/85 116/80  ?Pulse:  [name])  115 (!) 59 90  ?Resp: 19 17 18 18   ?Temp:  98.1 ?F (36.7 ?C) 98.1 ?F (36.7 ?C) 98.3 ?F (36.8 ?C)  ?TempSrc:  Oral Oral Oral  ?SpO2:  100% 97% 99%  ?Weight:      ?Height:      ? ?General exam: awake, alert, no acute distress ?HEENT: moist  mucus membranes, hearing grossly normal  ?Respiratory system: CTAB, no wheezes, rales or rhonchi, normal respiratory effort. ?Cardiovascular system: normal S1/S2, RRR, no pedal edema.   ?Gastrointestinal system: soft, NT, ND, no HSM felt, +bowel sounds. ?Central nervous system: A&O x3. no gross focal neurologic deficits, normal speech ?Extremities: moves all, no edema, normal tone ?Skin: dry, intact, normal temperature ?Psychiatry: normal mood, congruent affect, judgement and insight appear normal ? ? ?Data Reviewed: ? ?Notable Labs: BUN 53, creatinine improved 2.13, calcium 8.6, GFR 25, hemoglobin stable 8.8 ? ?Family Communication: None at bedside on rounds, will attempt to call this afternoon ? ?Disposition: ?Status is: Inpatient ?Remains inpatient appropriate because: Severity of illness with persistent AKI which is improving with diuresis.  Anticipate discharge 24 to 48 hours if further improvement in renal function ? ? Planned Discharge Destination: Home with Home Health ? ? ? ?Time spent: 35 minutes ? ?Author: ?Ezekiel Slocumb, DO ?01/31/2022 3:41 PM ? ?For on call review [email or web address].  ?

## 2022-01-31 NOTE — Assessment & Plan Note (Addendum)
Continue amiodarone. ?Metoprolol with holding parameters.  Eliquis for anticoagulation. ?Cardiology following, see their recommendations ?

## 2022-01-31 NOTE — Progress Notes (Signed)
[facility] Cardiology ? ?CARDIOLOGY CONSULT NOTE  ?Patient ID: ?Danielle [name] ?MRN: [id number] ?DOB/AGE: 11/17/1955 66 y.o. ? ?Admit date: 01/26/2022 ?Referring Physician [name] ?Primary Physician [name], [place] ?Primary Cardiologist [name] ?Reason for Consultation AF ? ?HPI:  ?Danielle [name] is a 66 year old female with history of advanced COPD on chronic oxygen (2 L), CKD (baseline creatinine 2-2.5), atrial fibrillation, mild aortic stenosis who was admitted with shortness of breath.  Cardiology is consulted for assistance with atrial fibrillation. ? ?Interval history: ?- continues to feel fatigued, HR remains variable with activity, peak in the low 120s, 70s at rest.  ?- echo resulted with reduced EF to 35-40%, g2dd, mod-severe TR, mod AS, mod MS ?- denies chest pain. ? ?Review of systems complete and found to be negative unless listed above  ? ?Past Medical History:  ?Diagnosis Date  ? Anemia   ? Anxiety   ? Aortic atherosclerosis ([place])   ? Aortic valve stenosis 02/10/2018  ? a.) TTE 02/10/2018: EF 55-60%: mild AS with MPG of 12 mmHg. b.) TTE 04/21/2018: EF 35-40%; mild AS with MPG 8 mmHg. c.) TTE 11/07/2019: EF 50-55%; mild AS with MPG 10 mmHg.  ? Arthritis   ? Atrial fibrillation ([place])   ? a.) CHA2DS2-VASc = 4 (age, sex, HTN, aortic plaque). b.) Rate/rhythm maintained on oral carvedilol; chronically anticoagulated on dose reduced apixaban. c.)  Attempted deployment of LAA occlusive device on 01/11/2021; parameters failed and procedure aborted.  ? Breast cancer, left ([place]) 2000  ? a.) T2N1M0; ER/PR (+) --> Tx'd with total mastectomy, LN resection, XRT, and chemotherapy  ? Cancer of right lung ([place]) 07/30/2016  ? a.) adenocarcinoma; ALK, ROS1, PDL1, BRAF, EGFR all negative.  ? Chronic diastolic CHF (congestive heart failure) ([place]) 11/26/2021  ? CKD (chronic kidney disease), stage IV ([place])   ? COPD (chronic obstructive pulmonary disease) ([place])   ? Dependence on supplemental oxygen   ? Depression   ? Diastolic  dysfunction 40/98/1191  ? a.) TTE 02/10/2017: EF 55-60%; G2DD. b.) TTE 04/21/2018: EF 35-40%; mild LA dilation; mod MV regurgitation. c.) TTE 11/07/2019: EF 50-55%; G1DD.  ? DOE (dyspnea on exertion)   ? GERD (gastroesophageal reflux disease)   ? Heart murmur   ? History of 2019 novel coronavirus disease (COVID-19) 10/14/2019  ? HLD (hyperlipidemia)   ? Hypertension   ? Long term current use of anticoagulant   ? a.) apixaban  ? Lymphedema   ? Personal history of chemotherapy   ? Personal history of radiation therapy   ? SBO (small bowel obstruction) ([place]) 11/05/2020  ? Vitamin D deficiency   ?  ?Past Surgical History:  ?Procedure Laterality Date  ? Breast Biospy Left   ? [place]  ? BREAST SURGERY    ? COLONOSCOPY N/A 04/30/2018  ? Procedure: COLONOSCOPY;  Surgeon: Virgel Manifold, MD;  Location: [facility] ENDOSCOPY;  Service: Endoscopy;  Laterality: N/A;  ? COLONOSCOPY N/A 07/22/2018  ? Procedure: COLONOSCOPY;  Surgeon: Virgel Manifold, MD;  Location: [facility] ENDOSCOPY;  Service: Endoscopy;  Laterality: N/A;  ? COLONOSCOPY WITH PROPOFOL N/A 09/21/2021  ? Procedure: COLONOSCOPY WITH PROPOFOL;  Surgeon: Benjamine Sprague, DO;  Location: [facility] ENDOSCOPY;  Service: General;  Laterality: N/A;  ? DILATION AND CURETTAGE OF UTERUS    ? ELECTROMAGNETIC NAVIGATION BROCHOSCOPY Right 04/11/2016  ? Procedure: ELECTROMAGNETIC NAVIGATION BRONCHOSCOPY;  Surgeon: Vilinda Boehringer, MD;  Location: [facility] ORS;  Service: Cardiopulmonary;  Laterality: Right;  ? ESOPHAGOGASTRODUODENOSCOPY N/A 07/22/2018  ? Procedure: ESOPHAGOGASTRODUODENOSCOPY (EGD);  Surgeon: Virgel Manifold,  MD;  Location: [facility] ENDOSCOPY;  Service: Endoscopy;  Laterality: N/A;  ? ESOPHAGOGASTRODUODENOSCOPY (EGD) WITH PROPOFOL N/A 05/07/2018  ? Procedure: ESOPHAGOGASTRODUODENOSCOPY (EGD) WITH PROPOFOL;  Surgeon: Lucilla Lame, MD;  Location[facility] ENDOSCOPY;  Service: Endoscopy;  Laterality: N/A;  ? ESOPHAGOGASTRODUODENOSCOPY (EGD) WITH PROPOFOL N/A 04/24/2019  ? Procedure:  ESOPHAGOGASTRODUODENOSCOPY (EGD) WITH PROPOFOL;  Surgeon: Jonathon Bellows, MD;  Location: [facility] ENDOSCOPY;  Service: Gastroenterology;  Laterality: N/A;  ? ESOPHAGOGASTRODUODENOSCOPY (EGD) WITH PROPOFOL N/A 01/12/2020  ? Procedure: ESOPHAGOGASTRODUODENOSCOPY (EGD) WITH PROPOFOL;  Surgeon: Jonathon Bellows, MD;  Location: [facility] ENDOSCOPY;  Service: Gastroenterology;  Laterality: N/A;  ? ESOPHAGOGASTRODUODENOSCOPY (EGD) WITH PROPOFOL N/A 04/28/2020  ? Procedure: ESOPHAGOGASTRODUODENOSCOPY (EGD) WITH PROPOFOL;  Surgeon: Jonathon Bellows, MD;  Location: [facility] ENDOSCOPY;  Service: Gastroenterology;  Laterality: N/A;  ? EUS N/A 05/07/2019  ? Procedure: FULL UPPER ENDOSCOPIC ULTRASOUND (EUS) RADIAL;  Surgeon: Jola Schmidt, MD;  Location: [facility] ENDOSCOPY;  Service: Endoscopy;  Laterality: N/A;  ? ILEOSCOPY N/A 07/22/2018  ? Procedure: ILEOSCOPY THROUGH STOMA;  Surgeon: Virgel Manifold, MD;  Location: [facility] ENDOSCOPY;  Service: Endoscopy;  Laterality: N/A;  ? ILEOSTOMY    ? ILEOSTOMY N/A 09/08/2018  ? Procedure: ILEOSTOMY REVISION POSSIBLE CREATION;  Surgeon: Herbert Pun, MD;  Location: [facility] ORS;  Service: General;  Laterality: N/A;  ? ILEOSTOMY CLOSURE N/A 08/15/2018  ? Procedure: DILATION OF ILEOSTOMY STRICTURE;  Surgeon: Herbert Pun, MD;  Location: [facility] ORS;  Service: General;  Laterality: N/A;  ? LAPAROTOMY Right 05/04/2018  ? Procedure: EXPLORATORY LAPAROTOMY right colectomy right and left ostomy;  Surgeon: Herbert Pun, MD;  Location: [facility] ORS;  Service: General;  Laterality: Right;  ? LEFT ATRIAL APPENDAGE OCCLUSION N/A 01/11/2021  ? Procedure: LEFT ATRIAL APPENDAGE OCCLUSION ([place]); ABORTED PROCEDURE WITHOUT DEVICE BEING IMPLANTED; Location: [facility]; Surgeon: Mylinda Latina, MD  ? LUNG BIOPSY    ? MASTECTOMY Left   ? 2000, [facility]  ? ROTATOR CUFF REPAIR Right   ? [place]  ? XI ROBOTIC ASSISTED COLOSTOMY TAKEDOWN N/A 10/23/2021  ? Procedure: XI ROBOTIC ASSISTED ILEOSTOMY TAKEDOWN;  Surgeon:  Herbert Pun, MD;  Location: [facility] ORS;  Service: General;  Laterality: N/A;  180 minutes for the surgery part please  ?  ?Medications Prior to Admission  ?Medication Sig Dispense Refill Last Dose  ? amiodarone (PACERONE) 200 MG tablet Take 1 tablet (200 mg total) by mouth 2 (two) times daily. 60 tablet 0 01/25/2022 at 2200  ? apixaban (ELIQUIS) 2.5 MG TABS tablet Take 2.5 mg by mouth 2 (two) times daily.   01/25/2022 at 2200  ? citalopram (CELEXA) 40 MG tablet Take 40 mg by mouth every morning.   01/25/2022 at 0900  ? Fluticasone-Umeclidin-Vilant 100-62.5-25 MCG/INH AEPB Inhale 1 puff into the lungs every morning.   01/25/2022 at 0900  ? metoprolol tartrate (LOPRESSOR) 25 MG tablet Take 0.5 tablets (12.5 mg total) by mouth 2 (two) times daily. 60 tablet 0 01/25/2022 at 2200  ? pantoprazole (PROTONIX) 40 MG tablet Take 1 tablet (40 mg total) by mouth 2 (two) times daily before a meal. 60 tablet 2 01/25/2022 at 1700  ? acetaminophen (TYLENOL) 325 MG tablet Take 2 tablets (650 mg total) by mouth every 6 (six) hours as needed for mild pain (or Fever >/= 101). (Patient taking differently: Take 650 mg by mouth every 4 (four) hours as needed for moderate pain.)   prn at unknown  ? albuterol (PROVENTIL HFA;VENTOLIN HFA) 108 (90 Base) MCG/ACT inhaler Inhale 2 puffs into the lungs every 6 (six) hours as needed  for wheezing or shortness of breath. 1 Inhaler 2 prn at unknown  ? calcium carbonate (TUMS - DOSED IN MG ELEMENTAL CALCIUM) 500 MG chewable tablet Chew 1 tablet by mouth daily.   prn at unknown  ? DILT-XR 240 MG 24 hr capsule Take 240 mg by mouth daily. (Patient not taking: Reported on 01/26/2022)   Not Taking  ? fidaxomicin (DIFICID) 200 MG TABS tablet Take 1 tablet (200 mg total) by mouth 2 (two) times daily. (Patient not taking: Reported on 01/26/2022) 15 tablet 0 Not Taking  ? midodrine (PROAMATINE) 5 MG tablet Take 1 tablet (5 mg total) by mouth 3 (three) times daily with meals. 60 tablet 0 prn at unknown  ?  OXYGEN Inhale 2 L into the lungs as needed (shortness of breath).     ? sucralfate (CARAFATE) 1 GM/10ML suspension Take 10 mLs (1 g total) by mouth 4 (four) times daily. (Patient not taking: Reported on 01/26/2022) 1200 mL 2 No

## 2022-01-31 NOTE — Assessment & Plan Note (Signed)
Follows with Dr. Rogue Bussing ?

## 2022-01-31 NOTE — Progress Notes (Signed)
Occupational Therapy Treatment ?Patient Details ?Name: Danielle [name] ?MRN: [id number] ?DOB: 02-20-1956 ?Today's Date: 01/31/2022 ? ? ?History of present illness 66 y.o.  female with medical problems of advanced COPD on chronic oxygen, chronic kidney disease, atrial fibrillation, mild aortic stenosis, history of lung cancer, history of esophageal cancer, MGUS, hyperlipidemia was admitted for shortness of breath was admitted on 01/26/2022 for pleural effusion. Pt s/p thoracentesis with removal of 900 cc of fluid on 4/16. Pt with suspected TIA on 4/17 (right facial droop/slurred speech which resolved, and right eye blurriness) however patient refusing MRI. ?  ?OT comments ? Pt seen for OT treatment on this date. Upon arrival to room, pt awake and seated upright in bed. Pt reporting that she is still experiencing R eye blurriness but improved since earlier this AM, and agreeable to OT tx. Pt required SUPERVISION for standing grooming tasks, SUPERVISION for standing UB bathing/dressing, and SUPERVISION for sit>stand LB dressing/bathing. This date, pt able to progress to standing for 5 mins before requiring seated rest break. Pt left sitting upright in recliner, on 2L, with all needs within reach. Pt is making good progress toward goals and continues to benefit from skilled OT services to maximize return to PLOF and minimize risk of future falls, injury, caregiver burden, and readmission. Will continue to follow POC. Discharge recommendation remains appropriate.  ? ?Recommendations for follow up therapy are one component of a multi-disciplinary discharge planning process, led by the attending physician.  Recommendations may be updated based on patient status, additional functional criteria and insurance authorization. ?   ?Follow Up Recommendations ? Home health OT  ?  ?Assistance Recommended at Discharge Intermittent Supervision/Assistance  ?Patient can return home with the following ? A little help with walking and/or  transfers;A little help with bathing/dressing/bathroom;Direct supervision/assist for medications management;Direct supervision/assist for financial management;Help with stairs or ramp for entrance;Assist for transportation;Assistance with cooking/housework ?  ?Equipment Recommendations ? None recommended by OT  ?  ?   ?Precautions / Restrictions Precautions ?Precautions: Fall ?Restrictions ?Weight Bearing Restrictions: No  ? ? ?  ? ?Mobility Bed Mobility ?Overal bed mobility: Modified Independent ?  ?  ?  ?  ?  ?  ?General bed mobility comments: no physical assistance ?  ? ?Transfers ?Overall transfer level: Needs assistance ?Equipment used: None ?Transfers: Sit to/from Stand ?Sit to Stand: Supervision ?  ?  ?  ?  ?  ?  ?  ?  ?Balance Overall balance assessment: Needs assistance ?Sitting-balance support: No upper extremity supported, Feet supported ?Sitting balance-Leahy Scale: Good ?Sitting balance - Comments: good sitting balance reaching outside BOS during LB bathing ?  ?Standing balance support: No upper extremity supported, During functional activity ?Standing balance-Leahy Scale: Fair ?Standing balance comment: Requires SUPERVISION for standing dressing/bathing ?  ?  ?  ?  ?  ?  ?  ?  ?  ?  ?  ?   ? ?ADL either performed or assessed with clinical judgement  ? ?ADL Overall ADL's : Needs assistance/impaired ?  ?  ?Grooming: Min guard;Standing;Wash/dry hands;Wash/dry face;Applying deodorant;Set up;Sitting ?Grooming Details (indicate cue type and reason): Pt able to stand for 5 mins at sinkside before reporting fatigue and requiring seated rest break ?Upper Body Bathing: Supervision/ safety;Standing ?  ?Lower Body Bathing: Supervison/ safety;Sit to/from stand ?  ?Upper Body Dressing : Supervision/safety;Standing ?Upper Body Dressing Details (indicate cue type and reason): to don/doff hospital gown ?Lower Body Dressing: Supervision/safety;Sit to/from stand ?Lower Body Dressing Details (indicate cue type and  reason): to don underwear and socks ?  ?  ?  ?  ?  ?  ?  ?  ?  ? ? ?Vision Baseline Vision/History: 1 Wears glasses ?Ability to See in Adequate Light: 0 Adequate ?Patient Visual Report: Blurring of vision (R eye blurriness) ?Vision Assessment?: Vision impaired- to be further tested in functional context ?  ?   ?   ? ?Cognition Arousal/Alertness: Awake/alert ?Behavior During Therapy: [facility] for tasks assessed/performed ?Overall Cognitive Status: Within Functional Limits for tasks assessed ?  ?  ?  ?  ?  ?  ?  ?  ?  ?  ?  ?  ?  ?  ?  ?  ?  ?  ?  ?   ?   ?   ?General Comments While on 2L, SpO2 desat 83% and HR 129 following standing for 5 mins. SpO2 increased to 92% within 60 sec of activity cessation  ? ? ?Pertinent Vitals/ Pain       Pain Assessment ?Pain Assessment: No/denies pain ? ?   ?   ? ?Frequency ? Min 3X/week  ? ? ? ? ?  ?Progress Toward Goals ? ?OT Goals(current goals can now be found in the care plan section) ? Progress towards OT goals: Progressing toward goals ? ?Acute Rehab OT Goals ?Patient Stated Goal: to go home ?OT Goal Formulation: With patient ?Time For Goal Achievement: 02/12/22 ?Potential to Achieve Goals: Fair  ?Plan Discharge plan remains appropriate;Frequency remains appropriate   ? ?   ?AM-PAC OT "6 Clicks" Daily Activity     ?Outcome Measure ? ? Help from another person eating meals?: None ?Help from another person taking care of personal grooming?: A Little ?Help from another person toileting, which includes using toliet, bedpan, or urinal?: A Little ?Help from another person bathing (including washing, rinsing, drying)?: A Little ?Help from another person to put on and taking off regular upper body clothing?: A Little ?Help from another person to put on and taking off regular lower body clothing?: A Little ?6 Click Score: 19 ? ?  ?End of Session Equipment Utilized During Treatment: Oxygen ? ?OT Visit Diagnosis: Unsteadiness on feet (R26.81);Muscle weakness (generalized) (M62.81) ?  ?Activity  Tolerance Patient tolerated treatment well ?  ?Patient Left in chair;with call bell/phone within reach ?  ?Nurse Communication Mobility status ?  ? ?   ? ?Time: 8119-1478 ?OT Time Calculation (min): 29 min ? ?Charges: OT General Charges ?$OT Visit: 1 Visit ?OT Treatments ?$Self Care/Home Management : 23-37 mins ? ?Fredirick Maudlin, OTR/L ?[place] ? ?

## 2022-01-31 NOTE — Assessment & Plan Note (Signed)
Depression-continue citalopram ?GERD-continue PPI ? ?

## 2022-01-31 NOTE — Assessment & Plan Note (Signed)
Resolved now.  ENT input appreciated.  Eliquis restarted, 4/17 ?

## 2022-01-31 NOTE — Assessment & Plan Note (Addendum)
Echo 01/30/22: EF 35 to 40% with regional WMA's, grade 2 diastolic dysfunction, moderate MR, moderate to severe TR, moderate AS. ?-- IV Lasix >> PO torsemide 40 mg ?-- Diuretics per nephro ?-- Monitor renal function electrolytes ?-- Strict I/O's and daily weights ?-- Follow cardiology recommendations ?

## 2022-01-31 NOTE — Progress Notes (Signed)
?[facility]  ?ROUNDING NOTE  ? ?Subjective:  ? ?Patient resting quietly ?Tolerating meals ?Continues to complain of shortness of breath on exertion ?Remains on O2 ? ?Creatinine 2.1 ? ?Objective:  ?Vital signs in last 24 hours:  ?Temp:  [97.4 ?F (36.3 ?C)-98.7 ?F (37.1 ?C)] 98.1 ?F (36.7 ?C) (04/19 1122) ?Pulse Rate:  [59-120] 59 (04/19 1122) ?Resp:  [16-20] 18 (04/19 1122) ?BP: (101-117)/(75-98) 107/85 (04/19 1122) ?SpO2:  [96 %-100 %] 97 % (04/19 1122) ? ?Weight change:  ?Filed Weights  ? 01/26/22 1830  ?Weight: 53.7 kg  ? ? ?Intake/Output: ?I/O last 3 completed shifts: ?In: -  ?Out: 2 [Urine:1; Stool:1] ?  ?Intake/Output this shift: ? Total I/O ?In: 0  ?Out: 200 [Urine:200] ? ?Physical Exam: ?General: NAD, resting comfortably  ?Head: Normocephalic, atraumatic. Moist oral mucosal membranes  ?Eyes: Anicteric  ?Lungs:  Basilar mild crackles, normal effort  ?Heart: Irregular rate and rhythm  ?Abdomen:  Soft, nontender  ?Extremities:  no peripheral edema.  ?Neurologic: Nonfocal, moving all four extremities  ?Skin: No lesions  ?Access: none  ? ? ?Basic Metabolic Panel: ?Recent Labs  ?Lab 01/27/22 ?0429 01/28/22 ?4696 01/29/22 ?0759 01/30/22 ?2952 01/31/22 ?0645  ?NA 138 137 135 137 137  ?K 4.3 4.0 4.1 4.0 4.4  ?CL 105 104 104 106 108  ?CO2 23 26 24 23 23   ?GLUCOSE 81 109* 113* 97 97  ?BUN 37* 52* 53* 60* 53*  ?CREATININE 2.11* 2.63* 2.90* 2.46* 2.13*  ?CALCIUM 8.8* 8.5* 8.5* 8.5* 8.6*  ?MG  --  1.5* 1.9 2.1  --   ?PHOS  --  3.8 4.1  --   --   ? ? ? ?Liver Function Tests: ?No results for input(s): AST, ALT, ALKPHOS, BILITOT, PROT, ALBUMIN in the last 168 hours. ?No results for input(s): LIPASE, AMYLASE in the last 168 hours. ?No results for input(s): AMMONIA in the last 168 hours. ? ?CBC: ?Recent Labs  ?Lab 01/26/22 ?1312 01/27/22 ?0429 01/28/22 ?8413 01/29/22 ?2440 01/30/22 ?1027 01/31/22 ?0645  ?WBC 6.8 5.5 7.2 7.8 7.1 6.1  ?NEUTROABS 4.1  --   --   --   --   --   ?HGB 10.6* 10.4* 9.0* 9.6* 8.7* 8.8*   ?HCT 32.9* 31.5* 28.7* 29.1* 27.2* 28.1*  ?MCV 92.7 92.9 95.3 94.2 94.1 95.9  ?PLT 227 201 206 231 236 242  ? ? ? ?Cardiac Enzymes: ?No results for input(s): CKTOTAL, CKMB, CKMBINDEX, TROPONINI in the last 168 hours. ? ?BNP: ?Invalid input(s): POCBNP ? ?CBG: ?Recent Labs  ?Lab 01/26/22 ?1823 01/29/22 ?2536  ?GLUCAP 126* 142*  ? ? ? ?Microbiology: ?Results for orders placed or performed during the hospital encounter of 01/26/22  ?Resp Panel by RT-PCR (Flu A&B, Covid) Nasopharyngeal Swab     Status: None  ? Collection Time: 01/26/22  1:32 PM  ? Specimen: Nasopharyngeal Swab; Nasopharyngeal(NP) swabs in vial transport medium  ?Result Value Ref Range Status  ? SARS Coronavirus 2 by RT PCR NEGATIVE NEGATIVE Final  ?  Comment: (NOTE) ?SARS-CoV-2 target nucleic acids are NOT DETECTED. ? ?The SARS-CoV-2 RNA is generally detectable in upper respiratory ?specimens during the acute phase of infection. The lowest ?concentration of SARS-CoV-2 viral copies this assay can detect is ?138 copies/mL. A negative result does not preclude SARS-Cov-2 ?infection and should not be used as the sole basis for treatment or ?other patient management decisions. A negative result may occur with  ?improper specimen collection/handling, submission of specimen other ?than nasopharyngeal swab, presence of viral mutation(s) within  the ?areas targeted by this assay, and inadequate number of viral ?copies(<138 copies/mL). A negative result must be combined with ?clinical observations, patient history, and epidemiological ?information. The expected result is Negative. ? ?Fact Sheet for Patients:  ?EntrepreneurPulse.com.au ? ?Fact Sheet for Healthcare Providers:  ?IncredibleEmployment.be ? ?This test is no t yet approved or cleared by the [place] FDA and  ?has been authorized for detection and/or diagnosis of SARS-CoV-2 by ?FDA under an Emergency Use Authorization (EUA). This EUA will remain  ?in effect (meaning  this test can be used) for the duration of the ?COVID-19 declaration under Section 564(b)(1) of the Act, 21 ?U.S.C.section 360bbb-3(b)(1), unless the authorization is terminated  ?or revoked sooner.  ? ? ?  ? Influenza A by PCR NEGATIVE NEGATIVE Final  ? Influenza B by PCR NEGATIVE NEGATIVE Final  ?  Comment: (NOTE) ?The Xpert Xpress SARS-CoV-2/FLU/RSV plus assay is intended as an aid ?in the diagnosis of influenza from Nasopharyngeal swab specimens and ?should not be used as a sole basis for treatment. Nasal washings and ?aspirates are unacceptable for Xpert Xpress SARS-CoV-2/FLU/RSV ?testing. ? ?Fact Sheet for Patients: ?EntrepreneurPulse.com.au ? ?Fact Sheet for Healthcare Providers: ?IncredibleEmployment.be ? ?This test is not yet approved or cleared by the [place] FDA and ?has been authorized for detection and/or diagnosis of SARS-CoV-2 by ?FDA under an Emergency Use Authorization (EUA). This EUA will remain ?in effect (meaning this test can be used) for the duration of the ?COVID-19 declaration under Section 564(b)(1) of the Act, 21 U.S.C. ?section 360bbb-3(b)(1), unless the authorization is terminated or ?revoked. ? ?Performed at [facility], [place], ?[place] [address] ?  ?MRSA Next Gen by PCR, Nasal     Status: None  ? Collection Time: 01/26/22  6:26 PM  ? Specimen: Nasal Mucosa; Nasal Swab  ?Result Value Ref Range Status  ? MRSA by PCR Next Gen NOT DETECTED NOT DETECTED Final  ?  Comment: (NOTE) ?The GeneXpert MRSA Assay (FDA approved for NASAL specimens only), ?is one component of a comprehensive MRSA colonization surveillance ?program. It is not intended to diagnose MRSA infection nor to guide ?or monitor treatment for MRSA infections. ?Test performance is not FDA approved in patients less than 2 years ?old. ?Performed at [facility], [place], ?[place] [address] ?  ?Acid Fast Smear (AFB)     Status: None  ?  Collection Time: 01/28/22  9:40 AM  ? Specimen: PATH Cytology Pleural fluid  ?Result Value Ref Range Status  ? AFB Specimen Processing Concentration  Final  ? Acid Fast Smear Negative  Final  ?  Comment: (NOTE) ?Performed At: [place] ?[address] [place], [place] [id number] ?Rush Farmer MD [id number] ?  ? Source (AFB) PLEURAL  Final  ?  Comment: Performed at [facility], [address] [place], [place] [address]  ?Body fluid culture w Gram Stain     Status: None (Preliminary result)  ? Collection Time: 01/28/22  9:40 AM  ? Specimen: PATH Cytology Pleural fluid  ?Result Value Ref Range Status  ? Specimen Description   Final  ?  PLEURAL ?Performed at [facility], [address] [place], [place] [address] ?  ? Special Requests   Final  ?  NONE ?Performed at [facility], [address], [place], [place] [address] ?  ? Gram Stain   Final  ?  RARE WBC PRESENT,BOTH PMN AND MONONUCLEAR ?NO ORGANISMS SEEN ?  ? Culture  Final  ?  NO GROWTH 3 DAYS ?Performed at [facility], [place] [address] [place], [place] [address] ?  ? Report Status PENDING  Incomplete  ? ? ?Coagulation Studies: ?No results for input(s): LABPROT, INR in the last 72 hours. ? ? ?Urinalysis: ?No results for input(s): COLORURINE, LABSPEC, [place], GLUCOSEU, HGBUR, BILIRUBINUR, KETONESUR, PROTEINUR, UROBILINOGEN, NITRITE, LEUKOCYTESUR in the last 72 hours. ? ?Invalid input(s): APPERANCEUR  ? ? ?Imaging: ?US Carotid Bilateral ? ?Result Date: 01/30/2022 ?CLINICAL DATA:  66 year old female with a history of stroke EXAM: BILATERAL CAROTID DUPLEX ULTRASOUND TECHNIQUE: Pearline Cables scale imaging, color Doppler and duplex ultrasound were performed of bilateral carotid and vertebral arteries in the neck. COMPARISON:  None. FINDINGS: Criteria: Quantification of carotid stenosis is based on velocity parameters that correlate the residual internal carotid diameter with NASCET-based stenosis levels, using the diameter  of the distal internal carotid lumen as the denominator for stenosis measurement. The following velocity measurements were obtained: RIGHT ICA:  Systolic 77 cm/sec, Diastolic 30 cm/sec CCA:  29 cm/sec SYSTOLI

## 2022-01-31 NOTE — Progress Notes (Signed)
Nutrition Follow-up ? ?DOCUMENTATION CODES:  ? ?Underweight ? ?INTERVENTION:  ? ?-Continue Nepro Shake po TID, each supplement provides 425 kcal and 19 grams protein  ?-Continue MVI with minerals daily ? ?NUTRITION DIAGNOSIS:  ? ?Severe Malnutrition related to chronic illness (COPD) as evidenced by moderate fat depletion, severe fat depletion, moderate muscle depletion, severe muscle depletion. ? ?Ongoing ? ?GOAL:  ? ?Patient will meet greater than or equal to 90% of their needs ? ?Progressing  ? ?MONITOR:  ? ?PO intake, Supplement acceptance, Labs, Weight trends, Skin, I & O's ? ?REASON FOR ASSESSMENT:  ? ?Malnutrition Screening Tool ?  ? ?ASSESSMENT:  ? ?Danielle [name] is a 66 y.o. female with medical history significant for COPD on 2 L home O2, CHF, CKD 4, remote history of breast cancer, history of lung cancer, esophageal cancer, MGUS, pression, hypertension, hyperlipidemia, A-fib on Eliquis, who presents with shortness of breath. ? ?4/16- s/p rt thoracentesis (900 ml removed)  ?4/17- code stroke ? ?Reviewed I/O's: -2 ml x 24 hours and +890 ml since admission ? ?UOP: 200 ml x 24 hours ? ?Pt unavailable at time of visit.  ? ?Pt with fair oral intake. Noted meal completions 40-80%. Pt also taking Ensure supplements.  ? ?Palliative care following for goals of care discussions. Pt unsure if she wants to go home with home health.  ? ?Medications reviewed and include lasix.  ? ?Labs reviewed: CBGS: 142 (inpatient orders for glycemic control are none).   ? ?Diet Order:   ?Diet Order   ? ?       ?  Diet regular Room service appropriate? Yes; Fluid consistency: Thin  Diet effective now       ?  ? ?  ?  ? ?  ? ? ?EDUCATION NEEDS:  ? ?Education needs have been addressed ? ?Skin:  Skin Assessment: Reviewed RN Assessment ? ?Last BM:  01/31/22 (type 4) ? ?Height:  ? ?Ht Readings from Last 1 Encounters:  ?01/26/22 5\' 6"  (1.676 m)  ? ? ?Weight:  ? ?Wt Readings from Last 1 Encounters:  ?01/26/22 53.7 kg  ? ? ?Ideal Body Weight:   59.1 kg ? ?BMI:  Body mass index is 19.11 kg/m?. ? ?Estimated Nutritional Needs:  ? ?Kcal:  1700-1900 ? ?Protein:  90-105 grams ? ?Fluid:  > 1.7 L ? ? ? ?Loistine Chance, RD, LDN, CDCES ?Registered Dietitian II ?Certified Diabetes Care and Education Specialist ?Please refer to [facility] for RD and/or RD on-call/weekend/after hours pager  ?

## 2022-01-31 NOTE — Assessment & Plan Note (Signed)
Involving left MCA distribution with signs/symptoms of right facial droop/slurred speech which resolved. ?CT head negative.   ?Patient refused MRI.   ?Eliquis restarted on 4/17, added Lipitor. ? ?Code stroke was activated night of 4/17-18 and seen by teleneurology, then Neurology as well.   ? ?Symptoms are resolved now.  ?4/19 some R vision blurring reported early AM, improved by rounds.  She continues to decline MRI. ? ?2D echo shows EF of 35 to 40%.   ?Carotid Dopplers shows no hemodynamically significant stenosis. ?Cannot get CT angio due to kidney failure and she is refusing MRI. ? ? ?

## 2022-01-31 NOTE — Progress Notes (Signed)
Chaplain Maggie provided Scientist, physiological education. Pt will ask nurse to notify chaplain if and when she is ready to sign document with a notary and witnesses. Chaplain is available for follow up as needed by paging on call chaplain. ?

## 2022-02-01 ENCOUNTER — Inpatient Hospital Stay: Payer: Medicare Other

## 2022-02-01 DIAGNOSIS — J90 Pleural effusion, not elsewhere classified: Secondary | ICD-10-CM | POA: Diagnosis not present

## 2022-02-01 LAB — BASIC METABOLIC PANEL
Anion gap: 7 (ref 5–15)
BUN: 51 mg/dL — ABNORMAL HIGH (ref 8–23)
CO2: 21 mmol/L — ABNORMAL LOW (ref 22–32)
Calcium: 8.5 mg/dL — ABNORMAL LOW (ref 8.9–10.3)
Chloride: 108 mmol/L (ref 98–111)
Creatinine, Ser: 2.11 mg/dL — ABNORMAL HIGH (ref 0.44–1.00)
GFR, Estimated: 25 mL/min — ABNORMAL LOW (ref >60)
Glucose, Bld: 74 mg/dL (ref 70–99)
Potassium: 4.5 mmol/L (ref 3.5–5.1)
Sodium: 136 mmol/L (ref 135–145)

## 2022-02-01 LAB — BODY FLUID CULTURE W GRAM STAIN: Culture: NO GROWTH

## 2022-02-01 LAB — MAGNESIUM: Magnesium: 1.8 mg/dL (ref 1.7–2.4)

## 2022-02-01 MED ORDER — TORSEMIDE 20 MG PO TABS
40.0000 mg | ORAL_TABLET | Freq: Every day | ORAL | Status: DC
  Administered 2022-02-02 – 2022-02-04 (×3): 40 mg via ORAL
  Filled 2022-02-01 (×3): qty 2

## 2022-02-01 NOTE — Progress Notes (Signed)
[facility] ? ? ? 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 Cardiology consulted for atrial fibrillation management.  ? ?The patient is fairly well on today but continues to c/o dyspnea despite being on supplemental oxygen via [place] at 2L with an O2 saturation of 98%. She denies having any chest pain, peripheral edema, dizziness or palpitations at this time. She is resting comfortably in bed and awaiting transport to Xray for imaging of the chest.  ? ? ?Vitals:  ? 01/31/22 1533 01/31/22 1940 01/31/22 2301 02/01/22 0313  ?BP: 116/80 115/89 (!) 116/93 109/86  ?Pulse: 90 (!) 101 100 81  ?Resp: 18 16 16 16   ?Temp: 98.3 ?F (36.8 ?C) 98.9 ?F (37.2 ?C) 98.6 ?F (37 ?C) 98 ?F (36.7 ?C)  ?TempSrc: Oral Oral Oral   ?SpO2: 99% 100% 98% 99%  ?Weight:      ?Height:      ? ? ? ?Intake/Output Summary (Last 24 hours) at 02/01/2022 1019 ?Last data filed at 01/31/2022 2100 ?Gross per 24 hour  ?Intake 60 ml  ?Output 250 ml  ?Net -190 ml  ? ? ? ? ?PHYSICAL EXAM ? ?General: Well developed, appears poorly nourished, in no acute distress ?HEENT:  Normocephalic and atraumatic. PERRL ?Neck:  No JVD.  ?Lungs: Clear bilaterally to auscultation in upper lobes, bibasilar crackles to auscultation bilaterally with R>L.  ?Heart: irregular heart rate and rhythm . Normal S1 and  S2 with grade 2/6 systolic murmur  ?Abdomen: abdomen soft and non-tender  ?Msk:  decreased strength and tone for age. ?Extremities: No clubbing, cyanosis or edema.   ?Neuro: Alert and oriented X 3. ?Psych:  Good affect, responds appropriately ? ? ?LABS: ?Basic Metabolic Panel: ?Recent Labs  ?  01/30/22 ?0648 01/31/22 ?0645 02/01/22 ?1950  ?NA 137 137 136  ?K 4.0 4.4 4.5  ?CL 106 108 108  ?CO2 23 23 21*  ?GLUCOSE 97 97 74  ?BUN 60* 53* 51*  ?CREATININE 2.46* 2.13* 2.11*  ?CALCIUM 8.5* 8.6* 8.5*  ?MG 2.1  --  1.8  ? ?Liver Function Tests: ?No results for input(s): AST, ALT, ALKPHOS, BILITOT, PROT, ALBUMIN in the last 72 hours. ?No results for input(s): LIPASE, AMYLASE in the last 72 hours. ?CBC: ?Recent Labs  ?  01/30/22 ?0648 01/31/22 ?0645  ?WBC 7.1 6.1  ?HGB 8.7* 8.8*  ?HCT 27.2* 28.1*  ?MCV 94.1 95.9  ?PLT 236 242  ? ?Cardiac Enzymes: ?No results for input(s): CKTOTAL, CKMB, CKMBINDEX, TROPONINI in the last 72 hours. ?BNP: ?Invalid input(s): POCBNP ?D-Dimer: ?No results for input(s): DDIMER in the last 72 hours. ?Hemoglobin A1C: ?No results for input(s): HGBA1C in the last 72 hours. ?Fasting Lipid Panel: ?No results for input(s): CHOL, HDL, LDLCALC, TRIG, CHOLHDL, LDLDIRECT in the last 72 hours. ?Thyroid Function Tests: ?No results for input(s): TSH, T4TOTAL, T3FREE, THYROIDAB in the last 72 hours. ? ?Invalid input(s): FREET3 ?Anemia Panel: ?No results for input(s): VITAMINB12, FOLATE, FERRITIN, TIBC, IRON, RETICCTPCT  in the last 72 hours. ? ?US Carotid Bilateral ? ?Result Date: 01/30/2022 ?CLINICAL DATA:  66 year old female with a history of stroke EXAM: BILATERAL CAROTID DUPLEX ULTRASOUND TECHNIQUE: Pearline Cables scale imaging, color Doppler and duplex ultrasound were performed of bilateral carotid and vertebral arteries in the neck. COMPARISON:  None. FINDINGS: Criteria: Quantification of carotid stenosis is based on velocity parameters that correlate the residual internal carotid diameter with NASCET-based stenosis  levels, using the diameter of the distal internal carotid lumen as the denominator for stenosis measurement. The following velocity measurements were obtained: RIGHT ICA:  Systolic 77 cm/sec, Diastolic 30 cm/sec CCA:  29 cm/sec SYSTOLIC ICA/CCA RATIO:  2.6 ECA:  27 cm/sec LEFT ICA:  Systolic 56 cm/sec, Diastolic 23 cm/sec CCA:  29 cm/sec SYSTOLIC ICA/CCA RATIO:  2.0 ECA:  28 cm/sec Right Brachial SBP: Not acquired Left Brachial SBP: Not acquired RIGHT CAROTID ARTERY: No significant calcifications of the right common carotid artery. Intermediate waveform maintained. Moderate heterogeneous and partially calcified plaque at the right carotid bifurcation. No significant lumen shadowing. Low resistance waveform of the right ICA. No significant tortuosity. RIGHT VERTEBRAL ARTERY: Antegrade flow with low resistance waveform. LEFT CAROTID ARTERY: No significant calcifications of the left common carotid artery. Intermediate waveform maintained. Moderate heterogeneous and partially calcified plaque at the left carotid bifurcation. No significant lumen shadowing. Low resistance waveform of the left ICA. No significant tortuosity. LEFT VERTEBRAL ARTERY:  Antegrade flow with low resistance waveform. IMPRESSION: Color duplex indicates moderate heterogeneous and calcified plaque, with no hemodynamically significant stenosis by duplex criteria in the extracranial cerebrovascular circulation. Signed, Dulcy Fanny. Dellia Nims, RPVI [facility] Radiology Electronically Signed   By: Corrie Mckusick D.O.   On: 01/30/2022 10:46   ? ? ?Echo: (01/1822) ?IMPRESSIONS  ? 1. Ant/apical/setal hpo.  ? 2. Left ventricular ejection fraction, by estimation, is 35 to 40%. The  ?left ventricle has moderately decreased function. The left ventricle  ?demonstrates regional wall motion abnormalities (see scoring  ?diagram/findings for description). The left  ?ventricular internal cavity size was mildly dilated.  Left ventricular  ?diastolic parameters are consistent with Grade II diastolic dysfunction  ?(pseudonormalization).  ? 3. Right ventricular systolic function is low normal. The right  ?ventricular size is normal.  ? 4. The mitral valve is normal in structure. Moderate mitral valve  ?regurgitation.  ? 5. Tricuspid valve regurgitation is moderate to severe.  ? 6. The aortic valve is normal in structure. Aortic valve regurgitation is  ?not visualized. Moderate aortic valve stenosis. ? ?TELEMETRY: atrial fibrillation with HR 80's  ? ?ASSESSMENT AND PLAN: ? ?Principal Problem: ?  Pleural effusion ?Active Problems: ?  Moderate COPD (chronic obstructive pulmonary disease) ([place]) ?  Breast cancer in female [facility]) ?  Malignant neoplasm of upper lobe of right lung ([place]) ?  Chronic systolic heart failure ([place]) ?  HTN (hypertension) ?  Atrial flutter ([place]) ?  Malignant neoplasm of lower third of esophagus ([facility]) ?  CKD (chronic kidney disease), stage IV ([place]) ?  Elevated troponin ?  Known medical problems ?  Epistaxis ?  TIA (transient ischemic attack) ?  ? ? ?#Atrial fibrillation with RVR with episodes of atrial flutter ?#Suspected TIA ?#Bilateral carotid arthrosclerosis ?Stable at this time. 4/15 patient found to be in atrial flutter likely aggravated by pleural effusion; cardiology consulted and the patient converted to SR while on diltiazem gtt but now back in atrial fibrillation with HR in the 80's as confirmed by telemetry monitoring. The patient has  been hypotensive, thus her home dosage of diltiazem 240mg  is being held at this time and metoprolol is being given within parameters as bp allows. The patient has a CHA2DS2VASc of 7 and eliquis that was briefly held upon admission due to epistaxis; however, on 4/17 the patient was suspected to have had an TIA affecting the left MCA territory and eliquis was restarted. H/H = 8.8/28.1 on today.  ?- continue amiodarone.  ?- continue metoprolol within parameters as BP allows for RVR  management.  ?- continue eliquis for stroke risk reduction. ?- follow bleeding precautions and monitor for s/s of epitaxis.  ?- continuous telemetry monitoring until discharge.  ?-Neurology consult apprecia

## 2022-02-01 NOTE — Consult Note (Signed)
? ?  Heart Failure Nurse Navigator Note ? ?HFrEF 35-40%.  Grade II diastolic dysfunction. ? ?She presented to the ED from [facility] with complaints of chest pain,progressive shortness of breath, and was noted to be tachycardic. ? ?Comorbidities: ? ?COPD ?Chronic home O2 use ?Chronic kidney disease ?Breast cancer ?Lung cancer ?Esophageal cancer ?MGUS ?Depression ?Hypertension ?Hyperlipidemia ?Atrial fibrillation ? ?Medications: ? ?Amiodarone 200 mg 2 times a day ?Eliquis 2.5 mg 2 times a day ?Atorvastatin 40 mg daily ?Furosemide 40 mg IV daily ?Metoprolol tartrate 12 and half milligrams every 6 hours ? ?She is scheduled to start torsemide 40 mg daily tomorrow. ? ?Labs: ? ?Sodium 136, potassium 4.5, chloride 108, CO2 21, BUN 51, creatinine 2.11, GFR 25 ?Weight is 53.7 kg ?Blood pressure 101/78 ?Intake is 60 mL ?Output 450 mL ? ?Initial meeting with patient today, she is lying in bed in no acute distress.  Remains on 02 at 2 liters.   ? ?Discussed what heart failure means and the new findings of decreased function of her heart. ? ?She says she does not have a scale and unable to weigh herself daily.  She will be supplied scale from [facility]. ? ?She states she has a son and granddaughter living with her.  She does most of the cooking, she continues to cook with salt.  Discussed removing the salt shaker from the table.  Also discussed the relationship with sodium and fluids.  She voices understanding.  Did not feel that she drank more than 64 ounces in a days time. ? ?She states she is compliant with her medications and does not have a problem with getting them. ? ?Made aware of her appointment in the heart failure clinic May 4 at 10 AM.  She has a no show percentage of 19 %, 73/381 appointments. ? ?She was given  Living with heart failure booklet, zone magnet, info on sodium and heart failure. ? ?She had no further questions. ? ?Pricilla Riffle RN CHFN ?

## 2022-02-01 NOTE — Assessment & Plan Note (Signed)
Secondary to compressive atelectasis and a moderate right pleural effusion. ?Also with advanced COPD with baseline oxygen requirement of 2 L/min. ?--Pulmonology following. ?-- Manage underlying issues as outlined ?-- Supplemental oxygen to maintain sats 88 to 94%, wean as tolerated ?-- I-S and flutter valve ?-- PT OT, mobilize as tolerated ?

## 2022-02-01 NOTE — Progress Notes (Addendum)
?Progress Note ? ? ?Patient: Danielle [name] GLO:[id number] DOB: 1955-11-10 DOA: 01/26/2022     7 ?DOS: the patient was seen and examined on 02/02/2022 ?  ?Brief hospital course: ?66 y.o. female with medical history significant for COPD on 2 L home O2, CHF, CKD 4, remote history of breast cancer, history of lung cancer, esophageal cancer, MGUS, pression, hypertension, hyperlipidemia, A-fib on Eliquis, who presents with shortness of breath. ? ?4/15: Cardio consult for A-flutter.  Nephro consult for acute on CKD stage IV, thoracentesis tomorrow under ultrasound guidance.  Eliquis on hold.  Epistaxis-ENT evaluation if not stop by conservative management ?4/16: Status post US guided thoracentesis with removal of 900 cc of fluid ?4/17: Suspected TIA involving left MCA territory last night.  Patient refusing MRI.  Eliquis restarted.  Started statin.  Seen by Neurology. ?4/18: Echo shows EF of 35 to 40%.  Carotid Doppler shows no hemodynamically significant stenosis.  Blood pressure running low. ?4/19: R eye blurred vision reported this AM. Pt continues to decline to have MRI.  Improved later this AM.  No other focal neurologic symptoms.  Cr continues to improve. ? ?4/20: HR's very labile from 40's to 110's.  Tachycardic and short of breath on exam. ? ?Assessment and Plan: ?* Pleural effusion ?S/p US guided thoracentesis on 4/16 with 900 mL of fluid removal.  Labs sent, cytology pending ?Now resumed on Lasix. ? ?TIA (transient ischemic attack) ?Involving left MCA distribution with signs/symptoms of right facial droop/slurred speech which resolved. ?CT head negative.   ?Patient refused MRI.   ?Eliquis restarted on 4/17, added Lipitor. ? ?Code stroke was activated night of 4/17-18 and seen by teleneurology, then Neurology as well.   ? ?Symptoms are resolved now.  ?4/19 some R vision blurring reported early AM, improved by rounds.  She continues to decline MRI. ? ?2D echo shows EF of 35 to 40%.   ?Carotid Dopplers shows no  hemodynamically significant stenosis. ?Cannot get CT angio due to kidney failure and she is refusing MRI. ? ? ? ?Epistaxis ?Resolved now.  ENT input appreciated.  Eliquis restarted, 4/17 ? ?Known medical problems ?Depression-continue citalopram ?GERD-continue PPI ? ? ?Elevated troponin ?Due to demand ischemia.   ?Cardiology following. ? ?CKD (chronic kidney disease), stage IV ([place]) ?Nephrology following.   ?Renal function continues to improve after resumption of Lasix. ?-- Continue Lasix 40 mg daily ? ? ?Malignant neoplasm of lower third of esophagus (H[facility]) ?Follows with Dr. Rogue Bussing ? ?Atrial flutter ([place]) ?Continue amiodarone. ?Metoprolol with holding parameters.  Eliquis for anticoagulation. ?Cardiology following, see their recommendations ? ?HTN (hypertension) ?Continue amiodarone, metoprolol and Lasix ? ?Chronic systolic heart failure ([place]) ?Echo 01/30/22: EF 35 to 40% with regional WMA's, grade 2 diastolic dysfunction, moderate MR, moderate to severe TR, moderate AS. ?-- IV Lasix >> PO torsemide 40 mg ?-- Diuretics per nephro ?-- Monitor renal function electrolytes ?-- Strict I/O's and daily weights ?-- Follow cardiology recommendations ? ?Acute on chronic respiratory failure with hypoxia (H[facility]) ?Secondary to compressive atelectasis and a moderate right pleural effusion. ?Also with advanced COPD with baseline oxygen requirement of 2 L/min. ?--Pulmonology following. ?-- Manage underlying issues as outlined ?-- Supplemental oxygen to maintain sats 88 to 94%, wean as tolerated ?-- I-S and flutter valve ?-- PT OT, mobilize as tolerated ? ?Malignant neoplasm of upper lobe of right lung ([place]) ?Followed by Dr. Rogue Bussing ? ?Breast cancer in female [facility]) ?Follow up outpatient as scheduled. ? ?Moderate COPD (chronic obstructive pulmonary disease) ([place]) ?No evidence of acute  exacerbation at present, on 3 L oxygen.  Wean oxygen as able.  Pulmonology following, see their recommendations ? ? ? ? ?  ? ?Subjective: Patient  seated edge of bed when seen on rounds morning.  She reported significant dyspnea with just up to use the bedside commode.  Also is intermittently feeling her heart race.  Denies chest pain or dizziness.  No other acute complaints at this time.  No acute events reported. ? ?Physical Exam: ?Vitals:  ? 02/01/22 2013 02/01/22 2326 02/02/22 0320 02/02/22 0737  ?BP: 102/70 100/83 (!) 114/93 109/73  ?Pulse: 82 91 (!) 105 75  ?Resp: 18 16 16 17   ?Temp: 98.3 ?F (36.8 ?C) 97.9 ?F (36.6 ?C) 98.1 ?F (36.7 ?C) 98.1 ?F (36.7 ?C)  ?TempSrc:      ?SpO2: 100% 100% 100% 99%  ?Weight:      ?Height:      ? ?General exam: awake, alert, no acute distress ?HEENT: moist mucus membranes, hearing grossly normal  ?Respiratory system: CTAB with diminished right base, no wheezes, rales or rhonchi, mildly increased respiratory effort at rest with mild conversational dyspnea. ?Cardiovascular system: normal S1/S2, irregularly irregular, no pedal edema.   ?Central nervous system: A&O x3. no gross focal neurologic deficits, normal speech ?Extremities: moves all, no edema, normal tone ?Skin: dry, intact, normal temperature ?Psychiatry: normal mood, congruent affect, judgement and insight appear normal ? ? ?Data Reviewed: ? ?Notable labs: CO2 21, BUN 51, creatinine 2.11 stable ? ?Family Communication: None at bedside on rounds, will attempt to call as time allows this afternoon ? ?Disposition: ?Status is: Inpatient ?Remains inpatient appropriate because: Severity of illness with ongoing dyspnea on minimal exertion, A-fib RVR with uncontrolled heart rate during my encounter today ? ? Planned Discharge Destination: Home with Home Health ? ? ? ?Time spent: 35 minutes ? ?Author: ?Ezekiel Slocumb, DO ?02/02/2022 9:16 AM ? ?For on call review [email or web address].  ?

## 2022-02-01 NOTE — Progress Notes (Signed)
Due to low eGFR, CTA of head and neck was discontinued. Carotid ultrasound was ordered to assess the cervical carotid and vertebral arteries. Patient refusing MRI/MRA, so unable to image the cerebral vasculature.  ? ?Carotid ultrasound: Color duplex indicates moderate heterogeneous and calcified plaque, ?with no hemodynamically significant stenosis by duplex criteria in the extracranial cerebrovascular circulation. ? ?Neurology will sign off. Please call if there are additional questions.  ? ?Electronically signed: Dr. Kerney Elbe ? ? ? ?

## 2022-02-01 NOTE — Progress Notes (Signed)
? ? ? ?PULMONOLOGY ? ? ? ? ? ? ? ? ?Date: 02/01/2022,   ?MRN# [id number] Danielle [name] 09/01/1956 ? ? ?  ?AdmissionWeight: 53.7 kg                 ?CurrentWeight: 53.7 kg ? ?Referring provider: Dr Dione Plover ? ? ?CHIEF COMPLAINT:  ? ?Acute on chronic hypoxemic respiratory failure ? ? ?HISTORY OF PRESENT ILLNESS  ? ?This is a 66 yo F with PMH as below with significant hx of advanced COPD and chronic hypoxemia on supplemental O2 at home 2l/minnc wth CKD/CHF overlap, hx of breast and lung ca s/p SBRT, MDD, HTD, dyslipidemia, AF on eliquis who came in with DOE and worsening sob at rest.  She had Cdiff infection recently. Patient does have stable vital signs but is with significant abnormalities on labwork including markedly elevated BNP, and abronmal CT chest with compressive atelectasis of right lung with sorrounding moderate pleural effusion.  ? ?02/01/22- patient has bibasilar atelectasis but very little pleural effusion on right. No need for thoracentesis buty she should do metaneb therapy with albuterol  ? ?PAST MEDICAL HISTORY  ? ?Past Medical History:  ?Diagnosis Date  ? Anemia   ? Anxiety   ? Aortic atherosclerosis ([place])   ? Aortic valve stenosis 02/10/2018  ? a.) TTE 02/10/2018: EF 55-60%: mild AS with MPG of 12 mmHg. b.) TTE 04/21/2018: EF 35-40%; mild AS with MPG 8 mmHg. c.) TTE 11/07/2019: EF 50-55%; mild AS with MPG 10 mmHg.  ? Arthritis   ? Atrial fibrillation ([place])   ? a.) CHA2DS2-VASc = 4 (age, sex, HTN, aortic plaque). b.) Rate/rhythm maintained on oral carvedilol; chronically anticoagulated on dose reduced apixaban. c.)  Attempted deployment of LAA occlusive device on 01/11/2021; parameters failed and procedure aborted.  ? Breast cancer, left ([place]) 2000  ? a.) T2N1M0; ER/PR (+) --> Tx'd with total mastectomy, LN resection, XRT, and chemotherapy  ? Cancer of right lung ([place]) 07/30/2016  ? a.) adenocarcinoma; ALK, ROS1, PDL1, BRAF, EGFR all negative.  ? Chronic diastolic CHF (congestive heart failure) ([place])  11/26/2021  ? CKD (chronic kidney disease), stage IV ([place])   ? COPD (chronic obstructive pulmonary disease) ([place])   ? Dependence on supplemental oxygen   ? Depression   ? Diastolic dysfunction 37/62/8315  ? a.) TTE 02/10/2017: EF 55-60%; G2DD. b.) TTE 04/21/2018: EF 35-40%; mild LA dilation; mod MV regurgitation. c.) TTE 11/07/2019: EF 50-55%; G1DD.  ? DOE (dyspnea on exertion)   ? GERD (gastroesophageal reflux disease)   ? Heart murmur   ? History of 2019 novel coronavirus disease (COVID-19) 10/14/2019  ? HLD (hyperlipidemia)   ? Hypertension   ? Long term current use of anticoagulant   ? a.) apixaban  ? Lymphedema   ? Personal history of chemotherapy   ? Personal history of radiation therapy   ? SBO (small bowel obstruction) ([place]) 11/05/2020  ? Vitamin D deficiency   ? ? ? ?SURGICAL HISTORY  ? ?Past Surgical History:  ?Procedure Laterality Date  ? Breast Biospy Left   ? [place]  ? BREAST SURGERY    ? COLONOSCOPY N/A 04/30/2018  ? Procedure: COLONOSCOPY;  Surgeon: Virgel Manifold, MD;  Location: [facility] ENDOSCOPY;  Service: Endoscopy;  Laterality: N/A;  ? COLONOSCOPY N/A 07/22/2018  ? Procedure: COLONOSCOPY;  Surgeon: Virgel Manifold, MD;  Location: [facility] ENDOSCOPY;  Service: Endoscopy;  Laterality: N/A;  ? COLONOSCOPY WITH PROPOFOL N/A 09/21/2021  ? Procedure: COLONOSCOPY WITH PROPOFOL;  Surgeon: Benjamine Sprague,  DO;  Location: [facility] ENDOSCOPY;  Service: General;  Laterality: N/A;  ? DILATION AND CURETTAGE OF UTERUS    ? ELECTROMAGNETIC NAVIGATION BROCHOSCOPY Right 04/11/2016  ? Procedure: ELECTROMAGNETIC NAVIGATION BRONCHOSCOPY;  Surgeon: Vilinda Boehringer, MD;  Location: [facility] ORS;  Service: Cardiopulmonary;  Laterality: Right;  ? ESOPHAGOGASTRODUODENOSCOPY N/A 07/22/2018  ? Procedure: ESOPHAGOGASTRODUODENOSCOPY (EGD);  Surgeon: Virgel Manifold, MD;  Location: [facility] ENDOSCOPY;  Service: Endoscopy;  Laterality: N/A;  ? ESOPHAGOGASTRODUODENOSCOPY (EGD) WITH PROPOFOL N/A 05/07/2018  ? Procedure:  ESOPHAGOGASTRODUODENOSCOPY (EGD) WITH PROPOFOL;  Surgeon: Lucilla Lame, MD;  Location: [facility] ENDOSCOPY;  Service: Endoscopy;  Laterality: N/A;  ? ESOPHAGOGASTRODUODENOSCOPY (EGD) WITH PROPOFOL N/A 04/24/2019  ? Procedure: ESOPHAGOGASTRODUODENOSCOPY (EGD) WITH PROPOFOL;  Surgeon: Jonathon Bellows, MD;  Location: [facility] ENDOSCOPY;  Service: Gastroenterology;  Laterality: N/A;  ? ESOPHAGOGASTRODUODENOSCOPY (EGD) WITH PROPOFOL N/A 01/12/2020  ? Procedure: ESOPHAGOGASTRODUODENOSCOPY (EGD) WITH PROPOFOL;  Surgeon: Jonathon Bellows, MD;  Location: [facility] ENDOSCOPY;  Service: Gastroenterology;  Laterality: N/A;  ? ESOPHAGOGASTRODUODENOSCOPY (EGD) WITH PROPOFOL N/A 04/28/2020  ? Procedure: ESOPHAGOGASTRODUODENOSCOPY (EGD) WITH PROPOFOL;  Surgeon: Jonathon Bellows, MD;  Location: [facility] ENDOSCOPY;  Service: Gastroenterology;  Laterality: N/A;  ? EUS N/A 05/07/2019  ? Procedure: FULL UPPER ENDOSCOPIC ULTRASOUND (EUS) RADIAL;  Surgeon: Jola Schmidt, MD;  Location: [facility] ENDOSCOPY;  Service: Endoscopy;  Laterality: N/A;  ? ILEOSCOPY N/A 07/22/2018  ? Procedure: ILEOSCOPY THROUGH STOMA;  Surgeon: Virgel Manifold, MD;  Location: [facility] ENDOSCOPY;  Service: Endoscopy;  Laterality: N/A;  ? ILEOSTOMY    ? ILEOSTOMY N/A 09/08/2018  ? Procedure: ILEOSTOMY REVISION POSSIBLE CREATION;  Surgeon: Herbert Pun, MD;  Location: [facility] ORS;  Service: General;  Laterality: N/A;  ? ILEOSTOMY CLOSURE N/A 08/15/2018  ? Procedure: DILATION OF ILEOSTOMY STRICTURE;  Surgeon: Herbert Pun, MD;  Location: [facility] ORS;  Service: General;  Laterality: N/A;  ? LAPAROTOMY Right 05/04/2018  ? Procedure: EXPLORATORY LAPAROTOMY right colectomy right and left ostomy;  Surgeon: Herbert Pun, MD;  Location: [facility] ORS;  Service: General;  Laterality: Right;  ? LEFT ATRIAL APPENDAGE OCCLUSION N/A 01/11/2021  ? Procedure: LEFT ATRIAL APPENDAGE OCCLUSION ([place]); ABORTED PROCEDURE WITHOUT DEVICE BEING IMPLANTED; Location: [facility]; Surgeon: Mylinda Latina, MD   ? LUNG BIOPSY    ? MASTECTOMY Left   ? 2000, [facility]  ? ROTATOR CUFF REPAIR Right   ? [place]  ? XI ROBOTIC ASSISTED COLOSTOMY TAKEDOWN N/A 10/23/2021  ? Procedure: XI ROBOTIC ASSISTED ILEOSTOMY TAKEDOWN;  Surgeon: Herbert Pun, MD;  Location: [facility] ORS;  Service: General;  Laterality: N/A;  180 minutes for the surgery part please  ? ? ? ?FAMILY HISTORY  ? ?Family History  ?Problem Relation Age of Onset  ? Breast cancer Mother 50  ? Cancer Mother   ?     Breast   ? Cirrhosis Father   ? Breast cancer Paternal Aunt 57  ? Cancer Maternal Aunt   ?     Breast   ? ? ? ?SOCIAL HISTORY  ? ?Social History  ? ?Tobacco Use  ? Smoking status: Former  ?  Packs/day: 0.50  ?  Years: 20.00  ?  Pack years: 10.00  ?  Types: Cigarettes  ?  Quit date: 07/02/2012  ?  Years since quitting: 9.5  ? Smokeless tobacco: Current  ?  Types: Snuff  ? Tobacco comments:  ?  quit 2014  ?Vaping Use  ? Vaping Use: Never used  ?Substance Use Topics  ? Alcohol use: Yes  ?  Comment: Occasionally beer  ? Drug use: No  ? ? ? ?  MEDICATIONS  ? ? ?Home Medication:  ?  ?Current Medication: ? ?Current Facility-Administered Medications:  ?  acetaminophen (TYLENOL) tablet 650 mg, 650 mg, Oral, Q6H PRN, 650 mg at 02/01/22 0512 **OR** acetaminophen (TYLENOL) suppository 650 mg, 650 mg, Rectal, Q6H PRN, Clarnce Flock, MD ?  amiodarone (PACERONE) tablet 200 mg, 200 mg, Oral, BID, Clarnce Flock, MD, 200 mg at 02/01/22 8325 ?  apixaban (ELIQUIS) tablet 2.5 mg, 2.5 mg, Oral, BID, Max Sane, MD, 2.5 mg at 02/01/22 0850 ?  atorvastatin (LIPITOR) tablet 40 mg, 40 mg, Oral, Daily, Max Sane, MD, 40 mg at 01/31/22 2138 ?  Chlorhexidine Gluconate Cloth 2 % PADS 6 each, 6 each, Topical, Daily, Clarnce Flock, MD, 6 each at 02/01/22 1037 ?  citalopram (CELEXA) tablet 20 mg, 20 mg, Oral, Daily, Beers, Shanon Brow, RPH, 20 mg at 02/01/22 0850 ?  feeding supplement (NEPRO CARB STEADY) liquid 237 mL, 237 mL, Oral, TID BM, Max Sane, MD, 237 mL at 01/31/22 0804 ?   furosemide (LASIX) injection 40 mg, 40 mg, Intravenous, Daily, Max Sane, MD, 40 mg at 02/01/22 0851 ?  guaiFENesin-dextromethorphan (ROBITUSSIN DM) 100-10 MG/5ML syrup 5 mL, 5 mL, Oral, Q4H PRN, Clarnce Flock, MD,

## 2022-02-01 NOTE — Progress Notes (Addendum)
?[facility]  ?ROUNDING NOTE  ? ?Subjective:  ? ?Patient resting quietly ?Complains of mild shortness of breath at rest today ?Remains on 2L [place] ?Tolerating small meals ? ?Creatinine 2.11 ? ?Objective:  ?Vital signs in last 24 hours:  ?Temp:  [97.6 ?F (36.4 ?C)-98.9 ?F (37.2 ?C)] 97.6 ?F (36.4 ?C) (04/20 1253) ?Pulse Rate:  [41-101] 41 (04/20 1253) ?Resp:  [16-18] 16 (04/20 1253) ?BP: (101-116)/(78-93) 101/78 (04/20 1253) ?SpO2:  [98 %-100 %] 100 % (04/20 1253) ? ?Weight change:  ?Filed Weights  ? 01/26/22 1830  ?Weight: 53.7 kg  ? ? ?Intake/Output: ?I/O last 3 completed shifts: ?In: 61 [P.O.:60] ?Out: 452 [Urine:451; Stool:1] ?  ?Intake/Output this shift: ? Total I/O ?In: 240 [P.O.:240] ?Out: -  ? ?Physical Exam: ?General: NAD, resting comfortably  ?Head: Normocephalic, atraumatic. Moist oral mucosal membranes  ?Eyes: Anicteric  ?Lungs:  Mild wheeze, normal effort  ?Heart: Irregular rate and rhythm  ?Abdomen:  Soft, nontender  ?Extremities:  no peripheral edema.  ?Neurologic: Nonfocal, moving all four extremities  ?Skin: No lesions  ?Access: none  ? ? ?Basic Metabolic Panel: ?Recent Labs  ?Lab 01/28/22 ?7096 01/29/22 ?0759 01/30/22 ?2836 01/31/22 ?0645 02/01/22 ?6294  ?NA 137 135 137 137 136  ?K 4.0 4.1 4.0 4.4 4.5  ?CL 104 104 106 108 108  ?CO2 26 24 23 23  21*  ?GLUCOSE 109* 113* 97 97 74  ?BUN 52* 53* 60* 53* 51*  ?CREATININE 2.63* 2.90* 2.46* 2.13* 2.11*  ?CALCIUM 8.5* 8.5* 8.5* 8.6* 8.5*  ?MG 1.5* 1.9 2.1  --  1.8  ?PHOS 3.8 4.1  --   --   --   ? ? ? ?Liver Function Tests: ?No results for input(s): AST, ALT, ALKPHOS, BILITOT, PROT, ALBUMIN in the last 168 hours. ?No results for input(s): LIPASE, AMYLASE in the last 168 hours. ?No results for input(s): AMMONIA in the last 168 hours. ? ?CBC: ?Recent Labs  ?Lab 01/26/22 ?1312 01/27/22 ?0429 01/28/22 ?7654 01/29/22 ?6503 01/30/22 ?5465 01/31/22 ?0645  ?WBC 6.8 5.5 7.2 7.8 7.1 6.1  ?NEUTROABS 4.1  --   --   --   --   --   ?HGB 10.6* 10.4* 9.0* 9.6* 8.7*  8.8*  ?HCT 32.9* 31.5* 28.7* 29.1* 27.2* 28.1*  ?MCV 92.7 92.9 95.3 94.2 94.1 95.9  ?PLT 227 201 206 231 236 242  ? ? ? ?Cardiac Enzymes: ?No results for input(s): CKTOTAL, CKMB, CKMBINDEX, TROPONINI in the last 168 hours. ? ?BNP: ?Invalid input(s): POCBNP ? ?CBG: ?Recent Labs  ?Lab 01/26/22 ?1823 01/29/22 ?6812  ?GLUCAP 126* 142*  ? ? ? ?Microbiology: ?Results for orders placed or performed during the hospital encounter of 01/26/22  ?Resp Panel by RT-PCR (Flu A&B, Covid) Nasopharyngeal Swab     Status: None  ? Collection Time: 01/26/22  1:32 PM  ? Specimen: Nasopharyngeal Swab; Nasopharyngeal(NP) swabs in vial transport medium  ?Result Value Ref Range Status  ? SARS Coronavirus 2 by RT PCR NEGATIVE NEGATIVE Final  ?  Comment: (NOTE) ?SARS-CoV-2 target nucleic acids are NOT DETECTED. ? ?The SARS-CoV-2 RNA is generally detectable in upper respiratory ?specimens during the acute phase of infection. The lowest ?concentration of SARS-CoV-2 viral copies this assay can detect is ?138 copies/mL. A negative result does not preclude SARS-Cov-2 ?infection and should not be used as the sole basis for treatment or ?other patient management decisions. A negative result may occur with  ?improper specimen collection/handling, submission of specimen other ?than nasopharyngeal swab, presence of viral mutation(s) within the ?  areas targeted by this assay, and inadequate number of viral ?copies(<138 copies/mL). A negative result must be combined with ?clinical observations, patient history, and epidemiological ?information. The expected result is Negative. ? ?Fact Sheet for Patients:  ?EntrepreneurPulse.com.au ? ?Fact Sheet for Healthcare Providers:  ?IncredibleEmployment.be ? ?This test is no t yet approved or cleared by the [place] FDA and  ?has been authorized for detection and/or diagnosis of SARS-CoV-2 by ?FDA under an Emergency Use Authorization (EUA). This EUA will remain  ?in effect  (meaning this test can be used) for the duration of the ?COVID-19 declaration under Section 564(b)(1) of the Act, 21 ?U.S.C.section 360bbb-3(b)(1), unless the authorization is terminated  ?or revoked sooner.  ? ? ?  ? Influenza A by PCR NEGATIVE NEGATIVE Final  ? Influenza B by PCR NEGATIVE NEGATIVE Final  ?  Comment: (NOTE) ?The Xpert Xpress SARS-CoV-2/FLU/RSV plus assay is intended as an aid ?in the diagnosis of influenza from Nasopharyngeal swab specimens and ?should not be used as a sole basis for treatment. Nasal washings and ?aspirates are unacceptable for Xpert Xpress SARS-CoV-2/FLU/RSV ?testing. ? ?Fact Sheet for Patients: ?EntrepreneurPulse.com.au ? ?Fact Sheet for Healthcare Providers: ?IncredibleEmployment.be ? ?This test is not yet approved or cleared by the [place] FDA and ?has been authorized for detection and/or diagnosis of SARS-CoV-2 by ?FDA under an Emergency Use Authorization (EUA). This EUA will remain ?in effect (meaning this test can be used) for the duration of the ?COVID-19 declaration under Section 564(b)(1) of the Act, 21 U.S.C. ?section 360bbb-3(b)(1), unless the authorization is terminated or ?revoked. ? ?Performed at [facility], [place], ?[place] [address] ?  ?MRSA Next Gen by PCR, Nasal     Status: None  ? Collection Time: 01/26/22  6:26 PM  ? Specimen: Nasal Mucosa; Nasal Swab  ?Result Value Ref Range Status  ? MRSA by PCR Next Gen NOT DETECTED NOT DETECTED Final  ?  Comment: (NOTE) ?The GeneXpert MRSA Assay (FDA approved for NASAL specimens only), ?is one component of a comprehensive MRSA colonization surveillance ?program. It is not intended to diagnose MRSA infection nor to guide ?or monitor treatment for MRSA infections. ?Test performance is not FDA approved in patients less than 2 years ?old. ?Performed at [facility], [place], ?[place] [address] ?  ?Fungus Culture With Stain      Status: None (Preliminary result)  ? Collection Time: 01/28/22  9:40 AM  ? Specimen: PATH Cytology Pleural fluid  ?Result Value Ref Range Status  ? Fungus Stain Final report  Final  ?  Comment: (NOTE) ?Performed At: [facility] ?[address] [place], [place] [id number] ?Rush Farmer MD [id number] ?  ? Fungus (Mycology) Culture PENDING  Incomplete  ? Fungal Source PLEURAL  Final  ?  Comment: Performed at [facility], [address] [place], [place] [address]  ?Acid Fast Smear (AFB)     Status: None  ? Collection Time: 01/28/22  9:40 AM  ? Specimen: PATH Cytology Pleural fluid  ?Result Value Ref Range Status  ? AFB Specimen Processing Concentration  Final  ? Acid Fast Smear Negative  Final  ?  Comment: (NOTE) ?Performed At: [facility] ?[address] [place], [place] [id number] ?Rush Farmer MD [id number] ?  ? Source (AFB) PLEURAL  Final  ?  Comment: Performed at [facility] [place], [address] [place], [place] [address]  ?Body fluid culture w Gram Stain     Status: None  ? Collection  Time: 01/28/22  9:40 AM  ? Specimen: PATH Cytology Pleural fluid  ?Result Value Ref Range Status  ? Specimen Description   Final  ?  PLEURAL ?Performed at [facility], [address], [place], [place] [address] ?  ? Special Requests   Final  ?  NONE ?Performed at [facility], [address] [place], [place] [address] ?  ? Gram Stain   Final  ?  RARE WBC PRESENT,BOTH PMN AND MONONUCLEAR ?NO ORGANISMS SEEN ?  ? Culture   Final  ?  NO GROWTH 3 DAYS ?Performed at [facility], [place] [address], [place], [place] [address] ?  ? Report Status 02/01/2022 FINAL  Final  ?Fungus Culture Result     Status: None  ? Collection Time: 01/28/22  9:40 AM  ?Result Value Ref Range Status  ? Result 1 Comment  Final  ?  Comment: (NOTE) ?KOH/Calcofluor preparation:  no fungus observed. ?Performed At: [facility] ?[address] [place], [place] [id number] ?Rush Farmer MD  [id number] ?  ? ? ?Coagulation Studies: ?No results for input(s): LABPROT, INR in the last 72 hours. ? ? ?Urinalysis: ?No results for input(s): COLORURINE, LABSPEC, [place], GLUCOSEU, HGBUR, BILIRUBINUR, KET

## 2022-02-02 DIAGNOSIS — J90 Pleural effusion, not elsewhere classified: Secondary | ICD-10-CM | POA: Diagnosis not present

## 2022-02-02 DIAGNOSIS — J029 Acute pharyngitis, unspecified: Secondary | ICD-10-CM | POA: Diagnosis not present

## 2022-02-02 LAB — CHOLESTEROL, BODY FLUID: Cholesterol, Fluid: 60 mg/dL

## 2022-02-02 LAB — BASIC METABOLIC PANEL
Anion gap: 7 (ref 5–15)
BUN: 54 mg/dL — ABNORMAL HIGH (ref 8–23)
CO2: 25 mmol/L (ref 22–32)
Calcium: 8.6 mg/dL — ABNORMAL LOW (ref 8.9–10.3)
Chloride: 106 mmol/L (ref 98–111)
Creatinine, Ser: 2.26 mg/dL — ABNORMAL HIGH (ref 0.44–1.00)
GFR, Estimated: 23 mL/min — ABNORMAL LOW (ref >60)
Glucose, Bld: 83 mg/dL (ref 70–99)
Potassium: 4.3 mmol/L (ref 3.5–5.1)
Sodium: 138 mmol/L (ref 135–145)

## 2022-02-02 LAB — MAGNESIUM: Magnesium: 1.9 mg/dL (ref 1.7–2.4)

## 2022-02-02 NOTE — Progress Notes (Signed)
[facility] ? ? ? 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 Cardiology consulted for atrial fibrillation management.  ? ?The patient is feeling fairly well on today but c/o dyspnea and reports that she cannot take a good deep breath. She continues to be on oxygen via [place] at 2L. The patient also c/o a sore throat that noticed upon awakening from her sleep. She denies having any chest pain, peripheral edema, palpitations or dizziness at this  time.  ? ?(02/01/2022): The patient is fairly well on today but continues to c/o dyspnea despite being on supplemental oxygen via [place] at 2L with an O2 saturation of 98%. She denies having any chest pain, peripheral edema, dizziness or palpitations at this time. She is resting comfortably in bed and awaiting transport to Xray for imaging of the chest.  ? ? ?Vitals:  ? 02/01/22 2013 02/01/22 2326 02/02/22 0320 02/02/22 0737  ?BP: 102/70 100/83 (!) 114/93 109/73  ?Pulse: 82 91 (!) 105 75  ?Resp: 18 16 16 17   ?Temp: 98.3 ?F (36.8 ?C) 97.9 ?F (36.6 ?C) 98.1 ?F (36.7 ?C) 98.1 ?F (36.7 ?C)  ?TempSrc:      ?SpO2: 100% 100% 100% 99%  ?Weight:      ?Height:      ? ? ? ?Intake/Output Summary (Last 24 hours) at 02/02/2022 1122 ?Last data filed at 02/02/2022 0900 ?Gross per 24 hour  ?Intake 800 ml  ?Output 300 ml  ?Net  500 ml  ? ? ? ? ?PHYSICAL EXAM ? ?General: Well developed, appears poorly nourished, in no acute distress ?HEENT:  Normocephalic and atraumatic. PERRL. Missing teeth.  ?Neck:  No JVD.  ?Lungs: Clear bilaterally to auscultation in upper lobes, fine bibasilar crackles to auscultation bilaterally, dry cough that is nonproductive, normal effort of breathing.  ?Heart: irregular heart rate and rhythm . Normal S1 and S2 with grade 2/6 systolic murmur  ?Abdomen: abdomen soft and non-tender  ?Msk:  decreased strength and tone for age. ?Extremities: No clubbing, cyanosis or edema.   ?Neuro: Alert and oriented X 3. ?Psych:  Good affect, responds appropriately ? ? ?LABS: ?Basic Metabolic Panel: ?Recent Labs  ?  02/01/22 ?3151 02/02/22 ?0335  ?NA 136 138  ?K 4.5 4.3  ?CL 108 106  ?CO2 21* 25  ?GLUCOSE 74 83  ?BUN 51* 54*  ?CREATININE 2.11* 2.26*  ?CALCIUM 8.5* 8.6*  ?MG 1.8 1.9  ? ?Liver Function Tests: ?No results for input(s): AST, ALT, ALKPHOS, BILITOT, PROT, ALBUMIN in the last 72 hours. ?No results for input(s): LIPASE, AMYLASE in the last 72 hours. ?CBC: ?Recent Labs  ?  01/31/22 ?0645  ?WBC 6.1  ?HGB 8.8*  ?HCT 28.1*  ?MCV 95.9  ?PLT 242  ? ?Cardiac Enzymes: ?No results for input(s): CKTOTAL, CKMB, CKMBINDEX, TROPONINI in the last 72 hours. ?BNP: ?Invalid input(s): POCBNP ?D-Dimer: ?No results for input(s): DDIMER in the last 72 hours. ?Hemoglobin A1C: ?No results for input(s): HGBA1C in  the last 72 hours. ?Fasting Lipid Panel: ?No results for input(s): CHOL, HDL, LDLCALC, TRIG, CHOLHDL, LDLDIRECT in the last 72 hours. ?Thyroid Function Tests: ?No results for input(s): TSH, T4TOTAL, T3FREE, THYROIDAB in the last 72 hours. ? ?Invalid input(s): FREET3 ?Anemia Panel: ?No results for input(s): VITAMINB12, FOLATE, FERRITIN, TIBC, IRON, RETICCTPCT in the last 72 hours. ? ?DG Chest 2 View ? ?Result Date: 02/01/2022 ?CLINICAL DATA:  Shortness of breath. EXAM: CHEST - 2 VIEW COMPARISON:  01/28/2022 FINDINGS: Stable mild  cardiomegaly. Aortic atherosclerotic calcification noted. Right upper lobe scarring remains stable. Bibasilar interstitial prominence and Kerley B-lines are increased, consistent with mild interstitial edema. Small right pleural effusion is also noted. No evidence of pulmonary consolidation. IMPRESSION: Mild congestive heart failure, with small right pleural effusion. Electronically Signed   By: Marlaine Hind M.D.   On: 02/01/2022 11:20   ? ? ?Echo: (01/1822) ?IMPRESSIONS  ? 1. Ant/apical/setal hpo.  ? 2. Left ventricular ejection fraction, by estimation, is 35 to 40%. The  ?left ventricle has moderately decreased function. The left ventricle  ?demonstrates regional wall motion abnormalities (see scoring  ?diagram/findings for description). The left  ?ventricular internal cavity size was mildly dilated. Left ventricular  ?diastolic parameters are consistent with Grade II diastolic dysfunction  ?(pseudonormalization).  ? 3. Right ventricular systolic function is low normal. The right  ?ventricular size is normal.  ? 4. The mitral valve is normal in structure. Moderate mitral valve  ?regurgitation.  ? 5. Tricuspid valve regurgitation is moderate to severe.  ? 6. The aortic valve is normal in structure. Aortic valve regurgitation is  ?not visualized. Moderate aortic valve stenosis. ? ?TELEMETRY: atrial fibrillation with HR 80-120 bpm.  ? ?ASSESSMENT AND PLAN: ? ?Principal Problem: ?  Pleural effusion ?Active Problems: ?  Moderate COPD (chronic obstructive pulmonary disease) ([place]) ?  Breast cancer in female [facility]) ?  Malignant neoplasm of upper lobe of right lung ([place]) ?  Acute on chronic respiratory failure with hypoxia (HCC) ?  Chronic systolic heart failure ([place]) ?  HTN (hypertension) ?  Atrial flutter ([place]) ?  Malignant neoplasm of lower third of esophagus (HCC) ?  CKD (chronic kidney disease), stage IV ([place]) ?  Elevated troponin ?  Known medical problems ?  Epistaxis ?  TIA (transient ischemic attack) ?  ? ? ?#Atrial  fibrillation with RVR with episodes of atrial flutter ?#Suspected TIA ?#Bilateral carotid arthrosclerosis ?Stable at this time. 4/15 patient found to be in atrial flutter likely aggravated by pleural effusion; cardiology consulted and the patient converted to SR while on diltiazem gtt but now back in atrial fibrillation with HR in the 80's mostly but with episodes of RVR between 114-120 bpm as confirmed by telemetry monitoring. The patient has been hypotensive, thus her home dosage of diltiazem 240mg  is being held at this time and metoprolol is being given within parameters as bp allows. The patient has a CHA2DS2VASc of 7 and eliquis that was briefly held upon admission due to epistaxis; however, on 4/17 the patient was suspected to have had an TIA affecting the left MCA territory and eliquis was restarted. H/H = 8.8/28.1 on 4/19.  ?- continue amiodarone.  ?- continue metoprolol within parameters as BP allows for RVR management.  ?- restart diltiazem when bp allows.  ?- continue eliquis for stroke risk reduction. ?- follow bleeding precautions and monitor for s/s of epitaxis.  ?- continuous telemetry monitoring until discharge.  ?-Neurology consult appreciated.  ?- monitor CBC.  ? ?#Right pleural effusion ?#Acute  on chronic respiratory failure with hypoxia ?#Moderate COPD ?#Malignant neoplasm of the upper lobe of the right lung ?Fairly stable. The patient continues to c/o dyspnea. Her oxygen saturations are >90% while on supplemental oxygen via [place]; however, she does have bibasilar crackles upon auscultation of the lungs likely secondary to pleural effusion. The patient had US thoracentesis on 4/16 in which 900 cc of fluid was removed. Repeat CXR on 4/20 revealed Mild congestive heart failure, with small right pleural effusion. ? - Agree with current management.  ? - pulmonary consult appreciated.  ? - continue supplemental Oxygen.  ? ?#HFrEF, chronic ?uncompensated. Fairly stable. Likely contributing to dyspnea;  however the patient appears euvolemic at this time. Upon admission on 01/26/2022 BNP was elevated at 1570.8. Inpatient echocardiogram from 01/30/2022 revealed anterior, apical, septal hypokinesis with moderately decrea

## 2022-02-02 NOTE — Progress Notes (Signed)
Mobility Specialist - Progress Note ? ? 02/02/22 1600  ?Mobility  ?Activity Refused mobility  ? ? ? ?Pt declined mobility, reports recent ambulation with nursing staff. Will attempt another date.  ? ? ?Kathee Delton ?Mobility Specialist ?02/02/22, 4:25 PM ? ? ? ? ?

## 2022-02-02 NOTE — Assessment & Plan Note (Signed)
Reported this morning, 4/21.  Associated with apparent nonproductive cough. ?Etiology unclear. ?Mild erythema posterior oropharynx on exam, no plaques or exudate seen. ?-- Check respiratory viral panel ?-- Supportive care with Cepacol lozenges and/or Chloraseptic spray for now ?-- Monitor ?

## 2022-02-02 NOTE — Progress Notes (Signed)
?[facility]  ?ROUNDING NOTE  ? ?Subjective:  ? ?Patient seen resting in bed ?Alert and oriented ?States she feels bad today, unable to verbalize specific concerns ?Patient seen later sitting in chair ?Short of breath with activity.  ? ?Objective:  ?Vital signs in last 24 hours:  ?Temp:  [97.6 ?F (36.4 ?C)-98.3 ?F (36.8 ?C)] 97.8 ?F (36.6 ?C) (04/21 1132) ?Pulse Rate:  [41-105] 87 (04/21 1132) ?Resp:  [16-18] 17 (04/21 1132) ?BP: (100-114)/(70-93) 104/80 (04/21 1132) ?SpO2:  [95 %-100 %] 100 % (04/21 1132) ? ?Weight change:  ?Filed Weights  ? 01/26/22 1830  ?Weight: 53.7 kg  ? ? ?Intake/Output: ?I/O last 3 completed shifts: ?In: 860 [P.O.:860] ?Out: 300 [Urine:300] ?  ?Intake/Output this shift: ? Total I/O ?In: 240 [P.O.:240] ?Out: -  ? ?Physical Exam: ?General: NAD, resting comfortably  ?Head: Normocephalic, atraumatic. Moist oral mucosal membranes  ?Eyes: Anicteric  ?Lungs:  Basilar crackles, normal effort, [place] O2  ?Heart: Irregular rate and rhythm  ?Abdomen:  Soft, nontender  ?Extremities:  no peripheral edema.  ?Neurologic: Nonfocal, moving all four extremities  ?Skin: No lesions  ?Access: none  ? ? ?Basic Metabolic Panel: ?Recent Labs  ?Lab 01/28/22 ?4540 01/29/22 ?0759 01/30/22 ?9811 01/31/22 ?0645 02/01/22 ?9147 02/02/22 ?0335  ?NA 137 135 137 137 136 138  ?K 4.0 4.1 4.0 4.4 4.5 4.3  ?CL 104 104 106 108 108 106  ?CO2 26 24 23 23  21* 25  ?GLUCOSE 109* 113* 97 97 74 83  ?BUN 52* 53* 60* 53* 51* 54*  ?CREATININE 2.63* 2.90* 2.46* 2.13* 2.11* 2.26*  ?CALCIUM 8.5* 8.5* 8.5* 8.6* 8.5* 8.6*  ?MG 1.5* 1.9 2.1  --  1.8 1.9  ?PHOS 3.8 4.1  --   --   --   --   ? ? ? ?Liver Function Tests: ?No results for input(s): AST, ALT, ALKPHOS, BILITOT, PROT, ALBUMIN in the last 168 hours. ?No results for input(s): LIPASE, AMYLASE in the last 168 hours. ?No results for input(s): AMMONIA in the last 168 hours. ? ?CBC: ?Recent Labs  ?Lab 01/26/22 ?1312 01/27/22 ?0429 01/28/22 ?8295 01/29/22 ?6213 01/30/22 ?0865  01/31/22 ?0645  ?WBC 6.8 5.5 7.2 7.8 7.1 6.1  ?NEUTROABS 4.1  --   --   --   --   --   ?HGB 10.6* 10.4* 9.0* 9.6* 8.7* 8.8*  ?HCT 32.9* 31.5* 28.7* 29.1* 27.2* 28.1*  ?MCV 92.7 92.9 95.3 94.2 94.1 95.9  ?PLT 227 201 206 231 236 242  ? ? ? ?Cardiac Enzymes: ?No results for input(s): CKTOTAL, CKMB, CKMBINDEX, TROPONINI in the last 168 hours. ? ?BNP: ?Invalid input(s): POCBNP ? ?CBG: ?Recent Labs  ?Lab 01/26/22 ?1823 01/29/22 ?7846  ?GLUCAP 126* 142*  ? ? ? ?Microbiology: ?Results for orders placed or performed during the hospital encounter of 01/26/22  ?Resp Panel by RT-PCR (Flu A&B, Covid) Nasopharyngeal Swab     Status: None  ? Collection Time: 01/26/22  1:32 PM  ? Specimen: Nasopharyngeal Swab; Nasopharyngeal(NP) swabs in vial transport medium  ?Result Value Ref Range Status  ? SARS Coronavirus 2 by RT PCR NEGATIVE NEGATIVE Final  ?  Comment: (NOTE) ?SARS-CoV-2 target nucleic acids are NOT DETECTED. ? ?The SARS-CoV-2 RNA is generally detectable in upper respiratory ?specimens during the acute phase of infection. The lowest ?concentration of SARS-CoV-2 viral copies this assay can detect is ?138 copies/mL. A negative result does not preclude SARS-Cov-2 ?infection and should not be used as the sole basis for treatment or ?other patient management decisions.  A negative result may occur with  ?improper specimen collection/handling, submission of specimen other ?than nasopharyngeal swab, presence of viral mutation(s) within the ?areas targeted by this assay, and inadequate number of viral ?copies(<138 copies/mL). A negative result must be combined with ?clinical observations, patient history, and epidemiological ?information. The expected result is Negative. ? ?Fact Sheet for Patients:  ?EntrepreneurPulse.com.au ? ?Fact Sheet for Healthcare Providers:  ?IncredibleEmployment.be ? ?This test is no t yet approved or cleared by the [place] FDA and  ?has been authorized for detection  and/or diagnosis of SARS-CoV-2 by ?FDA under an Emergency Use Authorization (EUA). This EUA will remain  ?in effect (meaning this test can be used) for the duration of the ?COVID-19 declaration under Section 564(b)(1) of the Act, 21 ?U.S.C.section 360bbb-3(b)(1), unless the authorization is terminated  ?or revoked sooner.  ? ? ?  ? Influenza A by PCR NEGATIVE NEGATIVE Final  ? Influenza B by PCR NEGATIVE NEGATIVE Final  ?  Comment: (NOTE) ?The Xpert Xpress SARS-CoV-2/FLU/RSV plus assay is intended as an aid ?in the diagnosis of influenza from Nasopharyngeal swab specimens and ?should not be used as a sole basis for treatment. Nasal washings and ?aspirates are unacceptable for Xpert Xpress SARS-CoV-2/FLU/RSV ?testing. ? ?Fact Sheet for Patients: ?EntrepreneurPulse.com.au ? ?Fact Sheet for Healthcare Providers: ?IncredibleEmployment.be ? ?This test is not yet approved or cleared by the [place] FDA and ?has been authorized for detection and/or diagnosis of SARS-CoV-2 by ?FDA under an Emergency Use Authorization (EUA). This EUA will remain ?in effect (meaning this test can be used) for the duration of the ?COVID-19 declaration under Section 564(b)(1) of the Act, 21 U.S.C. ?section 360bbb-3(b)(1), unless the authorization is terminated or ?revoked. ? ?Performed at [facility], [place], ?[place] [address] ?  ?MRSA Next Gen by PCR, Nasal     Status: None  ? Collection Time: 01/26/22  6:26 PM  ? Specimen: Nasal Mucosa; Nasal Swab  ?Result Value Ref Range Status  ? MRSA by PCR Next Gen NOT DETECTED NOT DETECTED Final  ?  Comment: (NOTE) ?The GeneXpert MRSA Assay (FDA approved for NASAL specimens only), ?is one component of a comprehensive MRSA colonization surveillance ?program. It is not intended to diagnose MRSA infection nor to guide ?or monitor treatment for MRSA infections. ?Test performance is not FDA approved in patients less than 2  years ?old. ?Performed at [facility], [place], ?[place] [address] ?  ?Fungus Culture With Stain     Status: None (Preliminary result)  ? Collection Time: 01/28/22  9:40 AM  ? Specimen: PATH Cytology Pleural fluid  ?Result Value Ref Range Status  ? Fungus Stain Final report  Final  ?  Comment: (NOTE) ?Performed At: [facility] ?[address] [place], [place] [id number] ?Rush Farmer MD OE:[id number] ?  ? Fungus (Mycology) Culture PENDING  Incomplete  ? Fungal Source PLEURAL  Final  ?  Comment: Performed at [facility], [address], [place], [place] [address]  ?Acid Fast Smear (AFB)     Status: None  ? Collection Time: 01/28/22  9:40 AM  ? Specimen: PATH Cytology Pleural fluid  ?Result Value Ref Range Status  ? AFB Specimen Processing Concentration  Final  ? Acid Fast Smear Negative  Final  ?  Comment: (NOTE) ?Performed At: [facility] ?[name] [place], [place] [id number] ?Rush Farmer MD TO:[id number] ?  ? Source (AFB) PLEURAL  Final  ?  Comment: Performed at [facility],  [place], [place] [address]  ?Body fluid culture w Gram Stain     Status: None  ? Collection Time: 01/28/22  9:40 AM  ? Specimen: PATH Cytology Pleural fluid  ?Result Value Ref Range Status  ? Specimen Description   Final  ?  PLEURAL ?Performed at [facility], [address] [place], [place] [address] ?  ? Special Requests   Final  ?  NONE ?Performed at [facility], [address], [place], [place] [address] ?  ? Gram Stain   Final  ?  RARE WBC PRESENT,BOTH PMN AND MONONUCLEAR ?NO ORGANISMS SEEN ?  ? Culture   Final  ?  NO GROWTH 3 DAYS ?Performed at [facility], [place] [address] [place], [place] [address] ?  ? Report Status 02/01/2022 FINAL  Final  ?Fungus Culture Result     Status: None  ? Collection Time: 01/28/22  9:40 AM  ?Result Value Ref Range Status  ? Result 1 Comment  Final  ?  Comment: (NOTE) ?KOH/Calcofluor  preparation:  no fungus observed. ?Performed At: [facility] ?[address] [place], [place] [id number] ?Rush Farmer MD IV:[id number] ?  ? ? ?Coagulation Studies: ?No results for input(s): LABPROT, INR in the last 72 hour

## 2022-02-02 NOTE — Progress Notes (Signed)
?Progress Note ? ? ?Patient: TARREN [name] EHU:[id number] DOB: 1955/12/09 DOA: 01/26/2022     7 ?DOS: the patient was seen and examined on 02/02/2022 ?  ?Brief hospital course: ?66 y.o. female with medical history significant for COPD on 2 L home O2, CHF, CKD 4, remote history of breast cancer, history of lung cancer, esophageal cancer, MGUS, pression, hypertension, hyperlipidemia, A-fib on Eliquis, who presents with shortness of breath. ? ?4/15: Cardio consult for A-flutter.  Nephro consult for acute on CKD stage IV, thoracentesis tomorrow under ultrasound guidance.  Eliquis on hold.  Epistaxis-ENT evaluation if not stop by conservative management ?4/16: Status post US guided thoracentesis with removal of 900 cc of fluid ?4/17: Suspected TIA involving left MCA territory last night.  Patient refusing MRI.  Eliquis restarted.  Started statin.  Seen by Neurology. ?4/18: Echo shows EF of 35 to 40%.  Carotid Doppler shows no hemodynamically significant stenosis.  Blood pressure running low. ?4/19: R eye blurred vision reported this AM. Pt continues to decline to have MRI.  Improved later this AM.  No other focal neurologic symptoms.  Cr continues to improve. ? ?4/20: HR's very labile from 40's to 110's.  Tachycardic and short of breath on exam. ? ?4/21: new sore throat, reports cough, feels unwell, still quite dyspneic with exertion ? ?Assessment and Plan: ?* Pleural effusion ?S/p US guided thoracentesis on 4/16 with 900 mL of fluid removal.  Labs sent, cytology pending ?Now resumed on Lasix. ? ?Sore throat ?Reported this morning, 4/21.  Associated with apparent nonproductive cough. ?Etiology unclear. ?Mild erythema posterior oropharynx on exam, no plaques or exudate seen. ?-- Check respiratory viral panel ?-- Supportive care with Cepacol lozenges and/or Chloraseptic spray for now ?-- Monitor ? ?TIA (transient ischemic attack) ?Involving left MCA distribution with signs/symptoms of right facial droop/slurred speech which  resolved. ?CT head negative.   ?Patient refused MRI.   ?Eliquis restarted on 4/17, added Lipitor. ? ?Code stroke was activated night of 4/17-18 and seen by teleneurology, then Neurology as well.   ? ?Symptoms are resolved now.  ?4/19 some R vision blurring reported early AM, improved by rounds.  She continues to decline MRI. ? ?2D echo shows EF of 35 to 40%.   ?Carotid Dopplers shows no hemodynamically significant stenosis. ?Cannot get CT angio due to kidney failure and she is refusing MRI. ? ? ? ?Epistaxis ?Resolved now.  ENT input appreciated.  Eliquis restarted, 4/17 ? ?Known medical problems ?Depression-continue citalopram ?GERD-continue PPI ? ? ?Elevated troponin ?Due to demand ischemia.   ?Cardiology following. ? ?CKD (chronic kidney disease), stage IV ([place]) ?Nephrology following.   ?Renal function continues to improve after resumption of Lasix. ?-- Continue Lasix 40 mg daily ? ? ?Malignant neoplasm of lower third of esophagus (HCC) ?Follows with Dr. Rogue Bussing ? ?Atrial flutter ([place]) ?Continue amiodarone. ?Metoprolol with holding parameters.  Eliquis for anticoagulation. ?Cardiology following, see their recommendations ? ?HTN (hypertension) ?Continue amiodarone, metoprolol and Lasix ? ?Chronic systolic heart failure ([place]) ?Echo 01/30/22: EF 35 to 40% with regional WMA's, grade 2 diastolic dysfunction, moderate MR, moderate to severe TR, moderate AS. ?-- IV Lasix >> PO torsemide 40 mg ?-- Diuretics per nephro ?-- Monitor renal function electrolytes ?-- Strict I/O's and daily weights ?-- Follow cardiology recommendations ? ?Acute on chronic respiratory failure with hypoxia (HCC) ?Secondary to compressive atelectasis and a moderate right pleural effusion. ?Also with advanced COPD with baseline oxygen requirement of 2 L/min. ?--Pulmonology following. ?-- Manage underlying issues as outlined ?-- Supplemental  oxygen to maintain sats 88 to 94%, wean as tolerated ?-- I-S and flutter valve ?-- PT OT, mobilize as  tolerated ? ?Malignant neoplasm of upper lobe of right lung ([place]) ?Followed by Dr. Rogue Bussing ? ?Breast cancer in female [facility]) ?Follow up outpatient as scheduled. ? ?Moderate COPD (chronic obstructive pulmonary disease) ([place]) ?No evidence of acute exacerbation at present, on 3 L oxygen.  Wean oxygen as able.  Pulmonology following, see their recommendations ? ? ? ? ?  ? ?Subjective: Patient up in recliner when seen today.  She reports still for more short of breath with exertion than she usually is.  She also reports new onset of sore throat, nonproductive cough.  Says she noted sore throat yesterday and it got worse overnight.  Denies fevers or chills.  Just says she feels unwell.  Did report episodes of feeling hot but she states not sure if that is due to hot flashes. ? ?Physical Exam: ?Vitals:  ? 02/02/22 0320 02/02/22 0737 02/02/22 1132 02/02/22 1557  ?BP: (!) 114/93 109/73 104/80 110/82  ?Pulse: (!) 105 75 87 83  ?Resp: 16 17 17 17   ?Temp: 98.1 ?F (36.7 ?C) 98.1 ?F (36.7 ?C) 97.8 ?F (36.6 ?C) 98 ?F (36.7 ?C)  ?TempSrc:      ?SpO2: 100% 99% 100% 100%  ?Weight:      ?Height:      ? ?General exam: awake, alert, no acute distress, frail and chronically ill-appearing ?HEENT: Wearing glasses, moist mucus membranes, hearing grossly normal  ?Respiratory system: CTAB with faint basilar crackles, no wheezes, rales or rhonchi, normal respiratory effort at rest. ?Cardiovascular system: normal S1/S2, RRR, no pedal edema.   ?Gastrointestinal system: soft, NT, ND, no HSM felt, +bowel sounds. ?Central nervous system: A&O x. no gross focal neurologic deficits, normal speech ?Extremities: moves all, no edema, normal tone ?Skin: dry, intact, normal temperature ?Psychiatry: normal mood, congruent affect ? ?Data Reviewed: ? ?Notable labs: BUN 54, creatinine 2.26 up slightly from 2.11, GFR 23 ? ?Chest x-ray yesterday - IMPRESSION: ?Mild congestive heart failure, with small right pleural effusion. ?-- Personally reviewed by me and  appears stable if not improved from prior chest x-ray on 4/16 ? ?Family Communication: None at bedside on rounds, will attempt to call this afternoon as time allows. ? ?Disposition: ?Status is: Inpatient ?Remains inpatient appropriate because: New sore throat and cough, persistent dyspnea on exertion ? ? Planned Discharge Destination: Home with Home Health ? ? ? ?Time spent: 35 minutes ? ?Author: ?Ezekiel Slocumb, DO ?02/02/2022 4:16 PM ? ?For on call review [email or web address].  ?

## 2022-02-02 NOTE — Care Management Important Message (Signed)
Important Message ? ?Patient Details  ?Name: Danielle [name] ?MRN: [id number] ?Date of Birth: 1955-11-10 ? ? ?Medicare Important Message Given:  Yes ? ? ? ? ?[name] ?02/02/2022, 3:26 PM ?

## 2022-02-02 NOTE — Progress Notes (Addendum)
PT Cancellation Note ? ?Patient Details ?Name: Danielle [name] ?MRN: [id number] ?DOB: [birth year] ? ? ?Cancelled Treatment:    Reason Eval/Treat Not Completed: Medical issues which prohibited therapy ? ?Offered session this am.  She is in bed stating she does not feel well today.  Vitals good.  She declined session at this time. ? ? ?Chesley Noon ?02/02/2022, 9:44 AM ?

## 2022-02-03 DIAGNOSIS — J90 Pleural effusion, not elsewhere classified: Secondary | ICD-10-CM | POA: Diagnosis not present

## 2022-02-03 LAB — CBC
HCT: 31 % — ABNORMAL LOW (ref 36.0–46.0)
Hemoglobin: 9.8 g/dL — ABNORMAL LOW (ref 12.0–15.0)
MCH: 30.7 pg (ref 26.0–34.0)
MCHC: 31.6 g/dL (ref 30.0–36.0)
MCV: 97.2 fL (ref 80.0–100.0)
Platelets: 334 10*3/uL (ref 150–400)
RBC: 3.19 MIL/uL — ABNORMAL LOW (ref 3.87–5.11)
RDW: 16.7 % — ABNORMAL HIGH (ref 11.5–15.5)
WBC: 6.4 10*3/uL (ref 4.0–10.5)
nRBC: 0 % (ref 0.0–0.2)

## 2022-02-03 LAB — BASIC METABOLIC PANEL
Anion gap: 6 (ref 5–15)
BUN: 49 mg/dL — ABNORMAL HIGH (ref 8–23)
CO2: 28 mmol/L (ref 22–32)
Calcium: 8.9 mg/dL (ref 8.9–10.3)
Chloride: 105 mmol/L (ref 98–111)
Creatinine, Ser: 2.4 mg/dL — ABNORMAL HIGH (ref 0.44–1.00)
GFR, Estimated: 22 mL/min — ABNORMAL LOW (ref >60)
Glucose, Bld: 95 mg/dL (ref 70–99)
Potassium: 4.3 mmol/L (ref 3.5–5.1)
Sodium: 139 mmol/L (ref 135–145)

## 2022-02-03 MED ORDER — TORSEMIDE 40 MG PO TABS: 40.0000 mg | ORAL_TABLET | Freq: Every day | ORAL | 2 refills | Status: DC

## 2022-02-03 MED ORDER — ADULT MULTIVITAMIN W/MINERALS CH
1.0000 | ORAL_TABLET | Freq: Every day | ORAL | Status: DC
Start: 2022-02-04 — End: 2022-04-12

## 2022-02-03 MED ORDER — METOPROLOL TARTRATE 25 MG PO TABS: 25.0000 mg | ORAL_TABLET | Freq: Two times a day (BID) | ORAL | 2 refills | Status: AC

## 2022-02-03 MED ORDER — CITALOPRAM HYDROBROMIDE 20 MG PO TABS: 20.0000 mg | ORAL_TABLET | Freq: Every day | ORAL | 2 refills | Status: AC

## 2022-02-03 MED ORDER — ATORVASTATIN CALCIUM 40 MG PO TABS: 40.0000 mg | ORAL_TABLET | Freq: Every day | ORAL | 2 refills | Status: AC

## 2022-02-03 MED ORDER — PHENOL 1.4 % MT LIQD: 1.0000 | OROMUCOSAL | 0 refills | Status: DC | PRN

## 2022-02-03 MED ORDER — GUAIFENESIN-DM 100-10 MG/5ML PO SYRP: 5.0000 mL | ORAL_SOLUTION | ORAL | 0 refills | Status: DC | PRN

## 2022-02-03 MED ORDER — NEPRO/CARBSTEADY PO LIQD: 237.0000 mL | Freq: Three times a day (TID) | ORAL | 0 refills | Status: DC

## 2022-02-03 NOTE — Discharge Summary (Addendum)
?Physician Discharge Summary ?  ?Patient: Danielle [name] MRN: [id number] DOB: 04-Sep-1956  ?Admit date:     01/26/2022  ?Discharge date: 02/04/2022  ?Discharge Physician: Ezekiel Slocumb  ? ?PCP: [name], [place]  ? ?Recommendations at discharge:  ? ? Follow up closely with nephrology, as scheduled ?Follow up with Cardiology as scheduled ?Follow up with PCP in 1-2 weeks ?Repeat BMP, Mg, CBC in 1 week ? ?Discharge Diagnoses: ?Principal Problem: ?  Pleural effusion ?Active Problems: ?  Moderate COPD (chronic obstructive pulmonary disease) ([place]) ?  Breast cancer in female [facility]) ?  Malignant neoplasm of upper lobe of right lung ([place]) ?  Acute on chronic respiratory failure with hypoxia (HCC) ?  Chronic systolic heart failure ([place]) ?  HTN (hypertension) ?  Atrial flutter ([place]) ?  Malignant neoplasm of lower third of esophagus (HCC) ?  CKD (chronic kidney disease), stage IV ([place]) ?  Elevated troponin ?  Known medical problems ?  Epistaxis ?  TIA (transient ischemic attack) ?  Sore throat ? ? ?Hospital Course: ?66 y.o. female with medical history significant for COPD on 2 L home O2, CHF, CKD 4, remote history of breast cancer, history of lung cancer, esophageal cancer, MGUS, pression, hypertension, hyperlipidemia, A-fib on Eliquis, who presents with shortness of breath. ? ?4/15: Cardio consult for A-flutter.  Nephro consult for acute on CKD stage IV, thoracentesis tomorrow under ultrasound guidance.  Eliquis on hold.  Epistaxis-ENT evaluation if not stop by conservative management ?4/16: Status post US guided thoracentesis with removal of 900 cc of fluid ?4/17: Suspected TIA involving left MCA territory last night.  Patient refusing MRI.  Eliquis restarted.  Started statin.  Seen by Neurology. ?4/18: Echo shows EF of 35 to 40%.  Carotid Doppler shows no hemodynamically significant stenosis.  Blood pressure running low. ?4/19: R eye blurred vision reported this AM. Pt continues to decline to have MRI.  Improved later this  AM.  No other focal neurologic symptoms.  Cr continues to improve. ? ?4/20: HR's very labile from 40's to 110's.  Tachycardic and short of breath on exam. ? ?4/21: new sore throat, reports cough, feels unwell, still quite dyspneic with exertion ? ?4/22: pt improved today, sore throat better, less short of breath but still some DOE.  Medically Stable for Discharge home with home health today. ? ?PM update - d/c delayed until tomorrow, due to family at work and unable to help for transportation or once home ? ? ?Assessment and Plan: ?* Pleural effusion ?S/p US guided thoracentesis on 4/16 with 900 mL of fluid removal.  Labs sent, cytology pending ?Now resumed on Lasix. ? ?Sore throat ?Reported this morning, 4/21.  Associated with apparent nonproductive cough. ?Etiology unclear. ?Mild erythema posterior oropharynx on exam, no plaques or exudate seen. ?-- Check respiratory viral panel ?-- Supportive care with Cepacol lozenges and/or Chloraseptic spray for now ?-- Monitor ? ?TIA (transient ischemic attack) ?Involving left MCA distribution with signs/symptoms of right facial droop/slurred speech which resolved. ?CT head negative.   ?Patient refused MRI.   ?Eliquis restarted on 4/17, added Lipitor. ? ?Code stroke was activated night of 4/17-18 and seen by teleneurology, then Neurology as well.   ? ?Symptoms are resolved now.  ?4/19 some R vision blurring reported early AM, improved by rounds.  She continues to decline MRI. ? ?2D echo shows EF of 35 to 40%.   ?Carotid Dopplers shows no hemodynamically significant stenosis. ?Cannot get CT angio due to kidney failure and she is  refusing MRI. ? ? ? ?Epistaxis ?Resolved now.  ENT input appreciated.  Eliquis restarted, 4/17 ? ?Known medical problems ?Depression-continue citalopram ?GERD-continue PPI ? ? ?Elevated troponin ?Due to demand ischemia.   ?Cardiology following. ? ?CKD (chronic kidney disease), stage IV ([place]) ?Nephrology following.   ?Renal function continues to improve  after resumption of Lasix. ?-- Continue Lasix 40 mg daily ? ? ?Malignant neoplasm of lower third of esophagus (HCC) ?Follows with Dr. Rogue Bussing ? ?Atrial flutter ([place]) ?Continue amiodarone. ?Metoprolol with holding parameters.  Eliquis for anticoagulation. ?Cardiology following, see their recommendations ? ?HTN (hypertension) ?Continue amiodarone, metoprolol and Lasix ? ?Chronic systolic heart failure ([place]) ?Echo 01/30/22: EF 35 to 40% with regional WMA's, grade 2 diastolic dysfunction, moderate MR, moderate to severe TR, moderate AS. ?-- IV Lasix >> PO torsemide 40 mg ?-- Diuretics per nephro ?-- Monitor renal function electrolytes ?-- Strict I/O's and daily weights ?-- Follow cardiology recommendations ? ?Acute on chronic respiratory failure with hypoxia (HCC) ?Secondary to compressive atelectasis and a moderate right pleural effusion. ?Also with advanced COPD with baseline oxygen requirement of 2 L/min. ?--Pulmonology following. ?-- Manage underlying issues as outlined ?-- Supplemental oxygen to maintain sats 88 to 94%, wean as tolerated ?-- I-S and flutter valve ?-- PT OT, mobilize as tolerated ? ?Malignant neoplasm of upper lobe of right lung ([place]) ?Followed by Dr. Rogue Bussing ? ?Breast cancer in female [facility]) ?Follow up outpatient as scheduled. ? ?Moderate COPD (chronic obstructive pulmonary disease) ([place]) ?No evidence of acute exacerbation at present, on 3 L oxygen.  Wean oxygen as able.  Pulmonology following, see their recommendations ? ? ? ? ?  ? ? ?Consultants: Nephrology, Cardiology, Pulmonology ?Procedures performed: Thoracentesis  ?Disposition: Home health ?Diet recommendation:  ?Regular diet ?DISCHARGE MEDICATION: ?Allergies as of 02/03/2022   ?No Known Allergies ?  ? ?  ?Medication List  ?  ? ?STOP taking these medications   ? ?Dificid 200 MG Tabs tablet ?Generic drug: fidaxomicin ?  ?Dilt-XR 240 MG 24 hr capsule ?Generic drug: diltiazem ?  ?sucralfate 1 GM/10ML suspension ?Commonly known as: CARAFATE ?   ? ?  ? ?TAKE these medications   ? ?acetaminophen 325 MG tablet ?Commonly known as: TYLENOL ?Take 2 tablets (650 mg total) by mouth every 6 (six) hours as needed for mild pain (or Fever >/= 101). ?What changed:  ?when to take this ?reasons to take this ?  ?albuterol 108 (90 Base) MCG/ACT inhaler ?Commonly known as: VENTOLIN HFA ?Inhale 2 puffs into the lungs every 6 (six) hours as needed for wheezing or shortness of breath. ?  ?amiodarone 200 MG tablet ?Commonly known as: PACERONE ?Take 1 tablet (200 mg total) by mouth 2 (two) times daily. ?  ?apixaban 2.5 MG Tabs tablet ?Commonly known as: ELIQUIS ?Take 2.5 mg by mouth 2 (two) times daily. ?  ?atorvastatin 40 MG tablet ?Commonly known as: LIPITOR ?Take 1 tablet (40 mg total) by mouth daily. ?  ?calcium carbonate 500 MG chewable tablet ?Commonly known as: TUMS - dosed in mg elemental calcium ?Chew 1 tablet by mouth daily. ?  ?citalopram 20 MG tablet ?Commonly known as: CELEXA ?Take 1 tablet (20 mg total) by mouth daily. ?Start taking on: February 04, 2022 ?What changed:  ?medication strength ?how much to take ?when to take this ?  ?feeding supplement (NEPRO CARB STEADY) Liqd ?Take 237 mLs by mouth 3 (three) times daily between meals. ?  ?Fluticasone-Umeclidin-Vilant 100-62.5-25 MCG/INH Aepb ?Inhale 1 puff into the lungs every morning. ?  ?guaiFENesin-dextromethorphan  100-10 MG/5ML syrup ?Commonly known as: ROBITUSSIN DM ?Take 5 mLs by mouth every 4 (four) hours as needed for cough. ?  ?metoprolol tartrate 25 MG tablet ?Commonly known as: LOPRESSOR ?Take 1 tablet (25 mg total) by mouth 2 (two) times daily. ?What changed: how much to take ?  ?midodrine 5 MG tablet ?Commonly known as: PROAMATINE ?Take 1 tablet (5 mg total) by mouth 3 (three) times daily with meals. ?  ?multivitamin with minerals Tabs tablet ?Take 1 tablet by mouth daily. ?Start taking on: February 04, 2022 ?  ?OXYGEN ?Inhale 2 L into the lungs as needed (shortness of breath). ?  ?pantoprazole 40 MG  tablet ?Commonly known as: Protonix ?Take 1 tablet (40 mg total) by mouth 2 (two) times daily before a meal. ?  ?phenol 1.4 % Liqd ?Commonly known as: CHLORASEPTIC ?Use as directed 1 spray in the mouth or th

## 2022-02-03 NOTE — Progress Notes (Signed)
? ? ? ?PULMONOLOGY ? ? ? ? ? ? ? ? ?Date: 02/03/2022,   ?MRN# [id number] Danielle [name] [birth year] ? ? ?  ?AdmissionWeight: 53.7 kg                 ?CurrentWeight: 53.7 kg ? ?Referring provider: Dr Dione Plover ? ? ?CHIEF COMPLAINT:  ? ?Acute on chronic hypoxemic respiratory failure ? ? ?HISTORY OF PRESENT ILLNESS  ? ?This is a 66 yo F with PMH as below with significant hx of advanced COPD and chronic hypoxemia on supplemental O2 at home 2l/minnc wth CKD/CHF overlap, hx of breast and lung ca s/p SBRT, MDD, HTD, dyslipidemia, AF on eliquis who came in with DOE and worsening sob at rest.  She had Cdiff infection recently. Patient does have stable vital signs but is with significant abnormalities on labwork including markedly elevated BNP, and abronmal CT chest with compressive atelectasis of right lung with sorrounding moderate pleural effusion.  ? ?4/22/ 23 - patient is stable for dc home. She is now at home dose O2 and is with non-labored breathing.  She will have daily fluid shifts and recurrence of mild interstitial infiltrates and effusion due to renal impairment with CHF and advanced COPD.  I recommend outpatient palliative evaluation and patient is young with severe multi organ dysfunction and poor prognosis.  ? ?PAST MEDICAL HISTORY  ? ?Past Medical History:  ?Diagnosis Date  ? Anemia   ? Anxiety   ? Aortic atherosclerosis ([place])   ? Aortic valve stenosis 02/10/2018  ? a.) TTE 02/10/2018: EF 55-60%: mild AS with MPG of 12 mmHg. b.) TTE 04/21/2018: EF 35-40%; mild AS with MPG 8 mmHg. c.) TTE 11/07/2019: EF 50-55%; mild AS with MPG 10 mmHg.  ? Arthritis   ? Atrial fibrillation ([place])   ? a.) CHA2DS2-VASc = 4 (age, sex, HTN, aortic plaque). b.) Rate/rhythm maintained on oral carvedilol; chronically anticoagulated on dose reduced apixaban. c.)  Attempted deployment of LAA occlusive device on 01/11/2021; parameters failed and procedure aborted.  ? Breast cancer, left ([place]) 2000  ? a.) T2N1M0; ER/PR (+) --> Tx'd with total  mastectomy, LN resection, XRT, and chemotherapy  ? Cancer of right lung ([place]) 07/30/2016  ? a.) adenocarcinoma; ALK, ROS1, PDL1, BRAF, EGFR all negative.  ? Chronic diastolic CHF (congestive heart failure) ([place]) 11/26/2021  ? CKD (chronic kidney disease), stage IV ([place])   ? COPD (chronic obstructive pulmonary disease) ([place])   ? Dependence on supplemental oxygen   ? Depression   ? Diastolic dysfunction 10/23/3233  ? a.) TTE 02/10/2017: EF 55-60%; G2DD. b.) TTE 04/21/2018: EF 35-40%; mild LA dilation; mod MV regurgitation. c.) TTE 11/07/2019: EF 50-55%; G1DD.  ? DOE (dyspnea on exertion)   ? GERD (gastroesophageal reflux disease)   ? Heart murmur   ? History of 2019 novel coronavirus disease (COVID-19) 10/14/2019  ? HLD (hyperlipidemia)   ? Hypertension   ? Long term current use of anticoagulant   ? a.) apixaban  ? Lymphedema   ? Personal history of chemotherapy   ? Personal history of radiation therapy   ? SBO (small bowel obstruction) ([place]) 11/05/2020  ? Vitamin D deficiency   ? ? ? ?SURGICAL HISTORY  ? ?Past Surgical History:  ?Procedure Laterality Date  ? Breast Biospy Left   ? [place]  ? BREAST SURGERY    ? COLONOSCOPY N/A 04/30/2018  ? Procedure: COLONOSCOPY;  Surgeon: Virgel Manifold, MD;  Location: [facility] ENDOSCOPY;  Service: Endoscopy;  Laterality: N/A;  ? COLONOSCOPY  N/A 07/22/2018  ? Procedure: COLONOSCOPY;  Surgeon: Virgel Manifold, MD;  Location: [facility] ENDOSCOPY;  Service: Endoscopy;  Laterality: N/A;  ? COLONOSCOPY WITH PROPOFOL N/A 09/21/2021  ? Procedure: COLONOSCOPY WITH PROPOFOL;  Surgeon: Benjamine Sprague, DO;  Location: [facility] ENDOSCOPY;  Service: General;  Laterality: N/A;  ? DILATION AND CURETTAGE OF UTERUS    ? ELECTROMAGNETIC NAVIGATION BROCHOSCOPY Right 04/11/2016  ? Procedure: ELECTROMAGNETIC NAVIGATION BRONCHOSCOPY;  Surgeon: Vilinda Boehringer, MD;  Location: [facility] ORS;  Service: Cardiopulmonary;  Laterality: Right;  ? ESOPHAGOGASTRODUODENOSCOPY N/A 07/22/2018  ? Procedure:  ESOPHAGOGASTRODUODENOSCOPY (EGD);  Surgeon: Virgel Manifold, MD;  [facility] END[facility]COPY;  Service: Endoscopy;  Laterality: N/A;  ? ESOPHAGOGASTRODUODENOSCOPY (EGD) WITH PROPOFOL N/A 05/07/2018  ? Procedure: ESOPHAGOGASTRODUODENOSCOPY (EGD) WITH PROPOFOL;  Surgeon: Lucilla Lame, MD;  Location[facility] ENDOSCOPY;  Service: Endoscopy;  Laterality: N/A;  ? ESOPHAGOGASTRODUODENOSCOPY (EGD) WITH PROPOFOL N/A 04/24/2019  ? Procedure: ESOPHAGOGASTRODUODENOSCOPY (EGD) WITH PROPOFOL;  Surgeon: Jonathon Bellows, MD;  Location: [facility] ENDOSCOPY;  Service: Gastroenterology;  Laterality: N/A;  ? ESOPHAGOGASTRODUODENOSCOPY (EGD) WITH PROPOFOL N/A 01/12/2020  ? Procedure: ESOPHAGOGASTRODUODENOSCOPY (EGD) WITH PROPOFOL;  Surgeon: Jonathon Bellows, MD;  Location: [facility] ENDOSCOPY;  Service: Gastroenterology;  Laterality: N/A;  ? ESOPHAGOGASTRODUODENOSCOPY (EGD) WITH PROPOFOL N/A 04/28/2020  ? Procedure: ESOPHAGOGASTRODUODENOSCOPY (EGD) WITH PROPOFOL;  Surgeon: Jonathon Bellows, MD;  Location: [facility] ENDOSCOPY;  Service: Gastroenterology;  Laterality: N/A;  ? EUS N/A 05/07/2019  ? Procedure: FULL UPPER ENDOSCOPIC ULTRASOUND (EUS) RADIAL;  Surgeon: Jola Schmidt, MD;  Location: [facility] ENDOSCOPY;  Service: Endoscopy;  Laterality: N/A;  ? ILEOSCOPY N/A 07/22/2018  ? Procedure: ILEOSCOPY THROUGH STOMA;  Surgeon: Virgel Manifold, MD;  Location: [facility] ENDOSCOPY;  Service: Endoscopy;  Laterality: N/A;  ? ILEOSTOMY    ? ILEOSTOMY N/A 09/08/2018  ? Procedure: ILEOSTOMY REVISION POSSIBLE CREATION;  Surgeon: Herbert Pun, MD;  Location: [facility] ORS;  Service: General;  Laterality: N/A;  ? ILEOSTOMY CLOSURE N/A 08/15/2018  ? Procedure: DILATION OF ILEOSTOMY STRICTURE;  Surgeon: Herbert Pun, MD;  Location: [facility] ORS;  Service: General;  Laterality: N/A;  ? LAPAROTOMY Right 05/04/2018  ? Procedure: EXPLORATORY LAPAROTOMY right colectomy right and left ostomy;  Surgeon: Herbert Pun, MD;  Location: [facility] ORS;  Service: General;  Laterality:  Right;  ? LEFT ATRIAL APPENDAGE OCCLUSION N/A 01/11/2021  ? Procedure: LEFT ATRIAL APPENDAGE OCCLUSION ([place]); ABORTED PROCEDURE WITHOUT DEVICE BEING IMPLANTED; Location: [facility]; Surgeon: Mylinda Latina, MD  ? LUNG BIOPSY    ? MASTECTOMY Left   ? 2000, [facility]  ? ROTATOR CUFF REPAIR Right   ? [place]  ? XI ROBOTIC ASSISTED COLOSTOMY TAKEDOWN N/A 10/23/2021  ? Procedure: XI ROBOTIC ASSISTED ILEOSTOMY TAKEDOWN;  Surgeon: Herbert Pun, MD;  Location: [facility] ORS;  Service: General;  Laterality: N/A;  180 minutes for the surgery part please  ? ? ? ?FAMILY HISTORY  ? ?Family History  ?Problem Relation Age of Onset  ? Breast cancer Mother 87  ? Cancer Mother   ?     Breast   ? Cirrhosis Father   ? Breast cancer Paternal Aunt 16  ? Cancer Maternal Aunt   ?     Breast   ? ? ? ?SOCIAL HISTORY  ? ?Social History  ? ?Tobacco Use  ? Smoking status: Former  ?  Packs/day: 0.50  ?  Years: 20.00  ?  Pack years: 10.00  ?  Types: Cigarettes  ?  Quit date: 07/02/2012  ?  Years since quitting: 9.5  ? Smokeless tobacco: Current  ?  Types: Snuff  ?  Tobacco comments:  ?  quit 2014  ?Vaping Use  ? Vaping Use: Never used  ?Substance Use Topics  ? Alcohol use: Yes  ?  Comment: Occasionally beer  ? Drug use: No  ? ? ? ?MEDICATIONS  ? ? ?Home Medication:  ?  ?Current Medication: ? ?Current Facility-Administered Medications:  ?  acetaminophen (TYLENOL) tablet 650 mg, 650 mg, Oral, Q6H PRN, 650 mg at 02/03/22 0507 **OR** acetaminophen (TYLENOL) suppository 650 mg, 650 mg, Rectal, Q6H PRN, Clarnce Flock, MD ?  amiodarone (PACERONE) tablet 200 mg, 200 mg, Oral, BID, Clarnce Flock, MD, 200 mg at 02/03/22 0932 ?  apixaban (ELIQUIS) tablet 2.5 mg, 2.5 mg, Oral, BID, Max Sane, MD, 2.5 mg at 02/03/22 6712 ?  atorvastatin (LIPITOR) tablet 40 mg, 40 mg, Oral, Daily, Max Sane, MD, 40 mg at 02/02/22 2126 ?  Chlorhexidine Gluconate Cloth 2 % PADS 6 each, 6 each, Topical, Daily, Clarnce Flock, MD, 6 each at 02/03/22 [id number] ?   citalopram (CELEXA) tablet 20 mg, 20 mg, Oral, Daily, Beers, Shanon Brow, RPH, 20 mg at 02/03/22 9983 ?  feeding supplement (NEPRO CARB STEADY) liquid 237 mL, 237 mL, Oral, TID BM, Max Sane, MD, 237 mL at 01/31/22 0804 ?  guaiFENes

## 2022-02-03 NOTE — Progress Notes (Signed)
[facility] Cardiology ? ? ? ?SUBJECTIVE: Patient feeling much better denies any pain still short of breath requiring supplemental oxygen no residual deficits at this point feels well enough to go home ? ? ?Vitals:  ? 02/03/22 0322 02/03/22 0756 02/03/22 1036 02/03/22 1120  ?BP: 111/89 109/78  114/82  ?Pulse: 67 77 (!) 110 76  ?Resp: 16 20  18   ?Temp: 97.7 ?F (36.5 ?C) 97.7 ?F (36.5 ?C)  97.7 ?F (36.5 ?C)  ?TempSrc:      ?SpO2: 100% 100%  100%  ?Weight:      ?Height:      ? ? ? ?Intake/Output Summary (Last 24 hours) at 02/03/2022 1403 ?Last data filed at 02/03/2022 1053 ?Gross per 24 hour  ?Intake 480 ml  ?Output 1700 ml  ?Net -1220 ml  ? ? ? ? ?PHYSICAL EXAM ? ?General: Well developed, well nourished, in no acute distress ?HEENT:  Normocephalic and atramatic ?Neck:  No JVD.  ?Lungs: Clear bilaterally to auscultation and percussion. ?Heart: Irregular irregular. Normal S1 and S2 without gallops or murmurs.  ?Abdomen: Bowel sounds are positive, abdomen soft and non-tender  ?Msk:  Back normal, normal gait. Normal strength and tone for age. ?Extremities: No clubbing, cyanosis or edema.   ?Neuro: Alert and oriented X 3. ?Psych:  Good affect, responds appropriately ? ? ?LABS: ?Basic Metabolic Panel: ?Recent Labs  ?  02/01/22 ?6384 02/02/22 ?5364 02/03/22 ?0515  ?NA 136 138 139  ?K 4.5 4.3 4.3  ?CL 108 106 105  ?CO2 21* 25 28  ?GLUCOSE 74 83 95  ?BUN 51* 54* 49*  ?CREATININE 2.11* 2.26* 2.40*  ?CALCIUM 8.5* 8.6* 8.9  ?MG 1.8 1.9  --   ? ?Liver Function Tests: ?No results for input(s): AST, ALT, ALKPHOS, BILITOT, PROT, ALBUMIN in the last 72 hours. ?No results for input(s): LIPASE, AMYLASE in the last 72 hours. ?CBC: ?Recent Labs  ?  02/03/22 ?0515  ?WBC 6.4  ?HGB 9.8*  ?HCT 31.0*  ?MCV 97.2  ?PLT 334  ? ?Cardiac Enzymes: ?No results for input(s): CKTOTAL, CKMB, CKMBINDEX, TROPONINI in the last 72 hours. ?BNP: ?Invalid input(s): POCBNP ?D-Dimer: ?No results for input(s): DDIMER in the last 72 hours. ?Hemoglobin A1C: ?No results for  input(s): HGBA1C in the last 72 hours. ?Fasting Lipid Panel: ?No results for input(s): CHOL, HDL, LDLCALC, TRIG, CHOLHDL, LDLDIRECT in the last 72 hours. ?Thyroid Function Tests: ?No results for input(s): TSH, T4TOTAL, T3FREE, THYROIDAB in the last 72 hours. ? ?Invalid input(s): FREET3 ?Anemia Panel: ?No results for input(s): VITAMINB12, FOLATE, FERRITIN, TIBC, IRON, RETICCTPCT in the last 72 hours. ? ?No results found. ? ? ?Echo moderate reduced left ventricular function EF around 35 to 40% ? ?TELEMETRY: Atrial fibrillation rate controlled around 100 nonspecific ST-T wave changes: ? ?ASSESSMENT AND PLAN: ? ?Principal Problem: ?  Pleural effusion ?Active Problems: ?  Moderate COPD (chronic obstructive pulmonary disease) ([place]) ?  Breast cancer in female [facility]) ?  Malignant neoplasm of upper lobe of right lung ([place]) ?  Acute on chronic respiratory failure with hypoxia (HCC) ?  Chronic systolic heart failure ([place]) ?  HTN (hypertension) ?  Atrial flutter ([place]) ?  Malignant neoplasm of lower third of esophagus (HCC) ?  CKD (chronic kidney disease), stage IV ([place]) ?  Elevated troponin ?  Known medical problems ?  Epistaxis ?  TIA (transient ischemic attack) ?  Sore throat ?  ? ?Plan ?Status post thoracentesis for large pleural effusion close to 1 L removed we will recommend continue Lasix  therapy ?Follow-up cytology and cell count hopefully will help with respiratory status ?Sore throat unclear etiology recommend conservative medical therapy ?TIA facial droop negative negative CT refused MRI recommend resume Eliquis statin therapy ?Cardiomyopathy ejection fraction between 35 to 40% recommend cardiomyopathy therapy ?Elevated troponins probably demand ischemia no evidence of continued ischemia ?Chronic renal insufficiency stage IV continue Lasix outpatient follow-up with nephrology ?Malignant neoplasm esophagus followed by oncology continue current management ?Atrial fibs flutter continue amiodarone for rhythm metoprolol for  rate Eliquis for anticoagulation ?Agree with hypertension management and control ?Evidence of upper lobe right lung neoplasm management and care as per oncology ?COPD moderate recommend supplemental oxygen inhalers follow-up with pulmonary ? ?Yolonda Kida, MD ?02/03/2022 ?2:03 PM ? ? ? ?  ?

## 2022-02-03 NOTE — Progress Notes (Signed)
?[facility]  ?ROUNDING NOTE  ? ?Subjective:  ? ?Patient appears well today, sitting at bedside ?Primary MD and nurse at bedside ?Continues to complain of shortness of breath with exertion states she does have shortness of breath at baseline but is able to perform some household chores with rest breaks.  Currently feels her shortness of breath is not at baseline. ? ?Creatinine 2.40 ?Urine output 1.2 L ? ? ?Objective:  ?Vital signs in last 24 hours:  ?Temp:  [97.7 ?F (36.5 ?C)-98.5 ?F (36.9 ?C)] 97.7 ?F (36.5 ?C) (04/22 0756) ?Pulse Rate:  [67-110] 110 (04/22 1036) ?Resp:  [16-20] 20 (04/22 0756) ?BP: (104-118)/(75-94) 109/78 (04/22 0756) ?SpO2:  [100 %] 100 % (04/22 0756) ? ?Weight change:  ?Filed Weights  ? 01/26/22 1830  ?Weight: 53.7 kg  ? ? ?Intake/Output: ?I/O last 3 completed shifts: ?In: 600 [P.O.:600] ?Out: 1500 [Urine:1500] ?  ?Intake/Output this shift: ? Total I/O ?In: 240 [P.O.:240] ?Out: 500 [Urine:500] ? ?Physical Exam: ?General: NAD, resting comfortably  ?Head: Normocephalic, atraumatic. Moist oral mucosal membranes  ?Eyes: Anicteric  ?Lungs:  Mild crackles, normal effort, [place] O2  ?Heart: Irregular rate and rhythm  ?Abdomen:  Soft, nontender  ?Extremities:  no peripheral edema.  ?Neurologic: Nonfocal, moving all four extremities  ?Skin: No lesions  ?Access: none  ? ? ?Basic Metabolic Panel: ?Recent Labs  ?Lab 01/28/22 ?4917 01/29/22 ?0759 01/30/22 ?9150 01/31/22 ?0645 02/01/22 ?5697 02/02/22 ?9480 02/03/22 ?0515  ?NA 137 135 137 137 136 138 139  ?K 4.0 4.1 4.0 4.4 4.5 4.3 4.3  ?CL 104 104 106 108 108 106 105  ?CO2 26 24 23 23  21* 25 28  ?GLUCOSE 109* 113* 97 97 74 83 95  ?BUN 52* 53* 60* 53* 51* 54* 49*  ?CREATININE 2.63* 2.90* 2.46* 2.13* 2.11* 2.26* 2.40*  ?CALCIUM 8.5* 8.5* 8.5* 8.6* 8.5* 8.6* 8.9  ?MG 1.5* 1.9 2.1  --  1.8 1.9  --   ?PHOS 3.8 4.1  --   --   --   --   --   ? ? ? ?Liver Function Tests: ?No results for input(s): AST, ALT, ALKPHOS, BILITOT, PROT, ALBUMIN in the last 168  hours. ?No results for input(s): LIPASE, AMYLASE in the last 168 hours. ?No results for input(s): AMMONIA in the last 168 hours. ? ?CBC: ?Recent Labs  ?Lab 01/28/22 ?1655 01/29/22 ?0759 01/30/22 ?3748 01/31/22 ?0645 02/03/22 ?0515  ?WBC 7.2 7.8 7.1 6.1 6.4  ?HGB 9.0* 9.6* 8.7* 8.8* 9.8*  ?HCT 28.7* 29.1* 27.2* 28.1* 31.0*  ?MCV 95.3 94.2 94.1 95.9 97.2  ?PLT 206 231 236 242 334  ? ? ? ?Cardiac Enzymes: ?No results for input(s): CKTOTAL, CKMB, CKMBINDEX, TROPONINI in the last 168 hours. ? ?BNP: ?Invalid input(s): POCBNP ? ?CBG: ?Recent Labs  ?Lab 01/29/22 ?2707  ?GLUCAP 142*  ? ? ? ?Microbiology: ?Results for orders placed or performed during the hospital encounter of 01/26/22  ?Resp Panel by RT-PCR (Flu A&B, Covid) Nasopharyngeal Swab     Status: None  ? Collection Time: 01/26/22  1:32 PM  ? Specimen: Nasopharyngeal Swab; Nasopharyngeal(NP) swabs in vial transport medium  ?Result Value Ref Range Status  ? SARS Coronavirus 2 by RT PCR NEGATIVE NEGATIVE Final  ?  Comment: (NOTE) ?SARS-CoV-2 target nucleic acids are NOT DETECTED. ? ?The SARS-CoV-2 RNA is generally detectable in upper respiratory ?specimens during the acute phase of infection. The lowest ?concentration of SARS-CoV-2 viral copies this assay can detect is ?138 copies/mL. A negative result does not preclude  SARS-Cov-2 ?infection and should not be used as the sole basis for treatment or ?other patient management decisions. A negative result may occur with  ?improper specimen collection/handling, submission of specimen other ?than nasopharyngeal swab, presence of viral mutation(s) within the ?areas targeted by this assay, and inadequate number of viral ?copies(<138 copies/mL). A negative result must be combined with ?clinical observations, patient history, and epidemiological ?information. The expected result is Negative. ? ?Fact Sheet for Patients:  ?EntrepreneurPulse.com.au ? ?Fact Sheet for Healthcare Providers:   ?IncredibleEmployment.be ? ?This test is no t yet approved or cleared by the [place] FDA and  ?has been authorized for detection and/or diagnosis of SARS-CoV-2 by ?FDA under an Emergency Use Authorization (EUA). This EUA will remain  ?in effect (meaning this test can be used) for the duration of the ?COVID-19 declaration under Section 564(b)(1) of the Act, 21 ?U.S.C.section 360bbb-3(b)(1), unless the authorization is terminated  ?or revoked sooner.  ? ? ?  ? Influenza A by PCR NEGATIVE NEGATIVE Final  ? Influenza B by PCR NEGATIVE NEGATIVE Final  ?  Comment: (NOTE) ?The Xpert Xpress SARS-CoV-2/FLU/RSV plus assay is intended as an aid ?in the diagnosis of influenza from Nasopharyngeal swab specimens and ?should not be used as a sole basis for treatment. Nasal washings and ?aspirates are unacceptable for Xpert Xpress SARS-CoV-2/FLU/RSV ?testing. ? ?Fact Sheet for Patients: ?EntrepreneurPulse.com.au ? ?Fact Sheet for Healthcare Providers: ?IncredibleEmployment.be ? ?This test is not yet approved or cleared by the [place] FDA and ?has been authorized for detection and/or diagnosis of SARS-CoV-2 by ?FDA under an Emergency Use Authorization (EUA). This EUA will remain ?in effect (meaning this test can be used) for the duration of the ?COVID-19 declaration under Section 564(b)(1) of the Act, 21 U.S.C. ?section 360bbb-3(b)(1), unless the authorization is terminated or ?revoked. ? ?Performed at [facility], [place], ?[place] [address] ?  ?MRSA Next Gen by PCR, Nasal     Status: None  ? Collection Time: 01/26/22  6:26 PM  ? Specimen: Nasal Mucosa; Nasal Swab  ?Result Value Ref Range Status  ? MRSA by PCR Next Gen NOT DETECTED NOT DETECTED Final  ?  Comment: (NOTE) ?The GeneXpert MRSA Assay (FDA approved for NASAL specimens only), ?is one component of a comprehensive MRSA colonization surveillance ?program. It is not intended to  diagnose MRSA infection nor to guide ?or monitor treatment for MRSA infections. ?Test performance is not FDA approved in patients less than 2 years ?old. ?Performed at [facility], [place], ?[place] [address] ?  ?Fungus Culture With Stain     Status: None (Preliminary result)  ? Collection Time: 01/28/22  9:40 AM  ? Specimen: PATH Cytology Pleural fluid  ?Result Value Ref Range Status  ? Fungus Stain Final report  Final  ?  Comment: (NOTE) ?Performed At: [facility] ?[address] [place], [place] [id number] ?Rush Farmer MD [id number] ?  ? Fungus (Mycology) Culture PENDING  Incomplete  ? Fungal Source PLEURAL  Final  ?  Comment: Performed at [facility], [address], [place], [place] [address]  ?Acid Fast Smear (AFB)     Status: None  ? Collection Time: 01/28/22  9:40 AM  ? Specimen: PATH Cytology Pleural fluid  ?Result Value Ref Range Status  ? AFB Specimen Processing Concentration  Final  ? Acid Fast Smear Negative  Final  ?  Comment: (NOTE) ?Performed At: [facility] ?[address] [place], [place] [id number] ?Rush Farmer MD  [id number] ?  ? Source (AFB) PLEURAL  Final  ?  Comment: Performed at [facility], [address] [place], [place] [address]  ?Body fluid culture w Gram Stain     Status: None  ? Collection Time: 01/28/22  9:40 AM  ? Specimen: PATH Cytology Pleural fluid  ?Result Value Ref Range Status  ? Specimen Description   Final  ?  PLEURAL ?Performed at [facility], [address] [place], [place] [address] ?  ? Special Requests   Final  ?  NONE ?Performed at [facility], [address], [place], [place] [address] ?  ? Gram Stain   Final  ?  RARE WBC PRESENT,BOTH PMN AND MONONUCLEAR ?NO ORGANISMS SEEN ?  ? Culture   Final  ?  NO GROWTH 3 DAYS ?Performed at [facility], [place] [address] [place], [place] [address] ?  ? Report Status 02/01/2022 FINAL  Final  ?Fungus Culture Result     Status:  None  ? Collection Time: 01/28/22  9:40 AM  ?Result Value Ref Range Status  ? Result 1 Comment  Final  ?  Comment: (NOTE) ?KOH/Calcofluor preparation:  no fungus observed. ?Performed At: [facility] ?[place]

## 2022-02-03 NOTE — TOC Progression Note (Signed)
Transition of Care (TOC) - Progression Note  ? ? ?Patient Details  ?Name: Danielle [name] ?MRN: [id number] ?Date of Birth: Apr 02, 1956 ? ?Transition of Care (TOC) CM/SW Contact  ?Alberteen Sam, LCSW ?Phone Number: ?02/03/2022, 9:46 AM ? ?Clinical Narrative:    ? ?CSW followed up with patient to see if she decided if she wants home health.  ? ?Patient reports she declines at this time. States she feels she is doing well and has gotten up to use the restroom etc.  ? ?Patient states she has home oxygen through [employer] and is aware that at discharge family should bring home O2 to pick her up.  ? ?No further dc needs identified at this time.  ? ? ?Expected Discharge Plan: Home/Self Care ?Barriers to Discharge: Continued Medical Work up ? ?Expected Discharge Plan and Services ?Expected Discharge Plan: Home/Self Care ?  ?  ?Post Acute Care Choice:  (TBD) ?Living arrangements for the past 2 months: [place] ?                ?  ?  ?  ?  ?  ?  ?  ?  ?  ?  ? ? ?Social Determinants of Health (SDOH) Interventions ?  ? ?Readmission Risk Interventions ? ?  01/29/2022  ? 11:36 AM 11/29/2021  ? 10:49 AM 10/16/2019  ?  3:20 PM  ?Readmission Risk Prevention Plan  ?Transportation Screening Complete Complete Complete  ?Medication Review Press photographer) Complete Complete Referral to Pharmacy  ?PCP or Specialist appointment within 3-5 days of discharge Complete Complete   ?[place] or Home Care Consult  Complete Complete  ?SW Recovery Care/Counseling Consult Complete Complete Complete  ?Palliative Care Screening Not Applicable Not Applicable Not Applicable  ?[place] Not Applicable Not Applicable Not Applicable  ? ? ?

## 2022-02-04 LAB — BASIC METABOLIC PANEL
Anion gap: 9 (ref 5–15)
BUN: 56 mg/dL — ABNORMAL HIGH (ref 8–23)
CO2: 30 mmol/L (ref 22–32)
Calcium: 9.1 mg/dL (ref 8.9–10.3)
Chloride: 104 mmol/L (ref 98–111)
Creatinine, Ser: 2.45 mg/dL — ABNORMAL HIGH (ref 0.44–1.00)
GFR, Estimated: 21 mL/min — ABNORMAL LOW (ref >60)
Glucose, Bld: 98 mg/dL (ref 70–99)
Potassium: 4.2 mmol/L (ref 3.5–5.1)
Sodium: 143 mmol/L (ref 135–145)

## 2022-02-14 LAB — MISC LABCORP TEST (SEND OUT)
LabCorp test name: 5367
Labcorp test code: 5367

## 2022-02-15 ENCOUNTER — Ambulatory Visit: Payer: Medicare Other | Admitting: Family

## 2022-02-21 ENCOUNTER — Inpatient Hospital Stay
Admission: EM | Admit: 2022-02-21 | Discharge: 2022-02-24 | DRG: 308 | Disposition: A | Discharge status: Home / Self Care | Payer: Medicare Other | Attending: Internal Medicine | Admitting: Internal Medicine

## 2022-02-21 ENCOUNTER — Emergency Department: Payer: Medicare Other

## 2022-02-21 DIAGNOSIS — Z9981 Dependence on supplemental oxygen: Secondary | ICD-10-CM

## 2022-02-21 DIAGNOSIS — D472 Monoclonal gammopathy: Secondary | ICD-10-CM | POA: Diagnosis present

## 2022-02-21 DIAGNOSIS — J9611 Chronic respiratory failure with hypoxia: Secondary | ICD-10-CM | POA: Diagnosis present

## 2022-02-21 DIAGNOSIS — J90 Pleural effusion, not elsewhere classified: Secondary | ICD-10-CM | POA: Diagnosis present

## 2022-02-21 DIAGNOSIS — Z7901 Long term (current) use of anticoagulants: Secondary | ICD-10-CM

## 2022-02-21 DIAGNOSIS — I5032 Chronic diastolic (congestive) heart failure: Secondary | ICD-10-CM

## 2022-02-21 DIAGNOSIS — Z8616 Personal history of COVID-19: Secondary | ICD-10-CM

## 2022-02-21 DIAGNOSIS — Z803 Family history of malignant neoplasm of breast: Secondary | ICD-10-CM

## 2022-02-21 DIAGNOSIS — Z79899 Other long term (current) drug therapy: Secondary | ICD-10-CM

## 2022-02-21 DIAGNOSIS — E785 Hyperlipidemia, unspecified: Secondary | ICD-10-CM | POA: Diagnosis present

## 2022-02-21 DIAGNOSIS — I4892 Unspecified atrial flutter: Secondary | ICD-10-CM | POA: Diagnosis present

## 2022-02-21 DIAGNOSIS — Z87891 Personal history of nicotine dependence: Secondary | ICD-10-CM

## 2022-02-21 DIAGNOSIS — Z923 Personal history of irradiation: Secondary | ICD-10-CM

## 2022-02-21 DIAGNOSIS — Z9221 Personal history of antineoplastic chemotherapy: Secondary | ICD-10-CM

## 2022-02-21 DIAGNOSIS — I130 Hypertensive heart and chronic kidney disease with heart failure and stage 1 through stage 4 chronic kidney disease, or unspecified chronic kidney disease: Secondary | ICD-10-CM | POA: Diagnosis present

## 2022-02-21 DIAGNOSIS — K219 Gastro-esophageal reflux disease without esophagitis: Secondary | ICD-10-CM | POA: Diagnosis present

## 2022-02-21 DIAGNOSIS — I5023 Acute on chronic systolic (congestive) heart failure: Secondary | ICD-10-CM | POA: Diagnosis present

## 2022-02-21 DIAGNOSIS — I480 Paroxysmal atrial fibrillation: Secondary | ICD-10-CM | POA: Diagnosis not present

## 2022-02-21 DIAGNOSIS — J449 Chronic obstructive pulmonary disease, unspecified: Secondary | ICD-10-CM | POA: Diagnosis present

## 2022-02-21 DIAGNOSIS — R0609 Other forms of dyspnea: Principal | ICD-10-CM

## 2022-02-21 DIAGNOSIS — N184 Chronic kidney disease, stage 4 (severe): Secondary | ICD-10-CM | POA: Diagnosis present

## 2022-02-21 DIAGNOSIS — Z9012 Acquired absence of left breast and nipple: Secondary | ICD-10-CM

## 2022-02-21 DIAGNOSIS — I4891 Unspecified atrial fibrillation: Secondary | ICD-10-CM

## 2022-02-21 DIAGNOSIS — Z9889 Other specified postprocedural states: Secondary | ICD-10-CM

## 2022-02-21 DIAGNOSIS — Z85118 Personal history of other malignant neoplasm of bronchus and lung: Secondary | ICD-10-CM

## 2022-02-21 DIAGNOSIS — Z853 Personal history of malignant neoplasm of breast: Secondary | ICD-10-CM

## 2022-02-21 LAB — CBC WITH DIFFERENTIAL/PLATELET
Abs Immature Granulocytes: 0.02 10*3/uL (ref 0.00–0.07)
Basophils Absolute: 0 10*3/uL (ref 0.0–0.1)
Basophils Relative: 1 %
Eosinophils Absolute: 0.1 10*3/uL (ref 0.0–0.5)
Eosinophils Relative: 2 %
HCT: 33.8 % — ABNORMAL LOW (ref 36.0–46.0)
Hemoglobin: 10.9 g/dL — ABNORMAL LOW (ref 12.0–15.0)
Immature Granulocytes: 0 %
Lymphocytes Relative: 27 %
Lymphs Abs: 1.6 10*3/uL (ref 0.7–4.0)
MCH: 30.7 pg (ref 26.0–34.0)
MCHC: 32.2 g/dL (ref 30.0–36.0)
MCV: 95.2 fL (ref 80.0–100.0)
Monocytes Absolute: 0.5 10*3/uL (ref 0.1–1.0)
Monocytes Relative: 9 %
Neutro Abs: 3.7 10*3/uL (ref 1.7–7.7)
Neutrophils Relative %: 61 %
Platelets: 195 10*3/uL (ref 150–400)
RBC: 3.55 MIL/uL — ABNORMAL LOW (ref 3.87–5.11)
RDW: 14.5 % (ref 11.5–15.5)
WBC: 6 10*3/uL (ref 4.0–10.5)
nRBC: 0 % (ref 0.0–0.2)

## 2022-02-21 LAB — URINALYSIS, ROUTINE W REFLEX MICROSCOPIC
Bacteria, UA: NONE SEEN
Bilirubin Urine: NEGATIVE
Glucose, UA: NEGATIVE mg/dL
Ketones, ur: NEGATIVE mg/dL
Nitrite: NEGATIVE
Protein, ur: NEGATIVE mg/dL
Specific Gravity, Urine: 1.004 — ABNORMAL LOW (ref 1.005–1.030)
pH: 6 (ref 5.0–8.0)

## 2022-02-21 LAB — COMPREHENSIVE METABOLIC PANEL
ALT: 13 U/L (ref 0–44)
AST: 21 U/L (ref 15–41)
Albumin: 3.9 g/dL (ref 3.5–5.0)
Alkaline Phosphatase: 68 U/L (ref 38–126)
Anion gap: 11 (ref 5–15)
BUN: 39 mg/dL — ABNORMAL HIGH (ref 8–23)
CO2: 24 mmol/L (ref 22–32)
Calcium: 9.4 mg/dL (ref 8.9–10.3)
Chloride: 97 mmol/L — ABNORMAL LOW (ref 98–111)
Creatinine, Ser: 2 mg/dL — ABNORMAL HIGH (ref 0.44–1.00)
GFR, Estimated: 27 mL/min — ABNORMAL LOW (ref >60)
Glucose, Bld: 73 mg/dL (ref 70–99)
Potassium: 3.7 mmol/L (ref 3.5–5.1)
Sodium: 132 mmol/L — ABNORMAL LOW (ref 135–145)
Total Bilirubin: 0.7 mg/dL (ref 0.3–1.2)
Total Protein: 8 g/dL (ref 6.5–8.1)

## 2022-02-21 LAB — PROCALCITONIN: Procalcitonin: 0.27 ng/mL

## 2022-02-21 LAB — TROPONIN I (HIGH SENSITIVITY)
Troponin I (High Sensitivity): 14 ng/L (ref <18)
Troponin I (High Sensitivity): 11 ng/L (ref <18)

## 2022-02-21 LAB — BRAIN NATRIURETIC PEPTIDE: B Natriuretic Peptide: 1059.9 pg/mL — ABNORMAL HIGH (ref 0.0–100.0)

## 2022-02-21 MED ORDER — AMIODARONE HCL 200 MG PO TABS
200.0000 mg | ORAL_TABLET | Freq: Two times a day (BID) | ORAL | Status: DC
  Administered 2022-02-21 – 2022-02-24 (×6): 200 mg via ORAL
  Filled 2022-02-21 (×6): qty 1

## 2022-02-21 MED ORDER — APIXABAN 2.5 MG PO TABS
2.5000 mg | ORAL_TABLET | Freq: Two times a day (BID) | ORAL | Status: DC
  Administered 2022-02-22 – 2022-02-24 (×6): 2.5 mg via ORAL
  Filled 2022-02-21 (×6): qty 1

## 2022-02-21 MED ORDER — LIDOCAINE 5 % EX PTCH
1.0000 | MEDICATED_PATCH | CUTANEOUS | Status: DC
  Administered 2022-02-21 – 2022-02-23 (×3): 1 via TRANSDERMAL
  Filled 2022-02-21 (×4): qty 1

## 2022-02-21 MED ORDER — ACETAMINOPHEN 325 MG PO TABS
650.0000 mg | ORAL_TABLET | Freq: Four times a day (QID) | ORAL | Status: DC | PRN
  Administered 2022-02-21 – 2022-02-23 (×3): 650 mg via ORAL
  Filled 2022-02-21 (×4): qty 2

## 2022-02-21 MED ORDER — DILTIAZEM HCL 25 MG/5ML IV SOLN
10.0000 mg | Freq: Once | INTRAVENOUS | Status: AC
  Administered 2022-02-21: 10 mg via INTRAVENOUS
  Filled 2022-02-21: qty 5

## 2022-02-21 MED ORDER — CALCIUM CARBONATE ANTACID 500 MG PO CHEW
1.0000 | CHEWABLE_TABLET | Freq: Every day | ORAL | Status: DC
  Administered 2022-02-22 – 2022-02-24 (×3): 200 mg via ORAL
  Filled 2022-02-21 (×3): qty 1

## 2022-02-21 MED ORDER — GUAIFENESIN-DM 100-10 MG/5ML PO SYRP: 5.0000 mL | ORAL_SOLUTION | ORAL | Status: DC | PRN

## 2022-02-21 MED ORDER — METOPROLOL TARTRATE 25 MG PO TABS
25.0000 mg | ORAL_TABLET | Freq: Two times a day (BID) | ORAL | Status: DC
  Administered 2022-02-21 – 2022-02-24 (×5): 25 mg via ORAL
  Filled 2022-02-21 (×6): qty 1

## 2022-02-21 MED ORDER — TORSEMIDE 20 MG PO TABS
40.0000 mg | ORAL_TABLET | Freq: Every day | ORAL | Status: DC
  Administered 2022-02-21 – 2022-02-24 (×4): 40 mg via ORAL
  Filled 2022-02-21 (×4): qty 2

## 2022-02-21 MED ORDER — FLUTICASONE-UMECLIDIN-VILANT 100-62.5-25 MCG/INH IN AEPB: 1.0000 | INHALATION_SPRAY | RESPIRATORY_TRACT | Status: DC

## 2022-02-21 MED ORDER — ALBUTEROL SULFATE (2.5 MG/3ML) 0.083% IN NEBU: 3.0000 mL | INHALATION_SOLUTION | Freq: Four times a day (QID) | RESPIRATORY_TRACT | Status: DC | PRN

## 2022-02-21 MED ORDER — ONDANSETRON HCL 4 MG PO TABS: 4.0000 mg | ORAL_TABLET | Freq: Four times a day (QID) | ORAL | Status: DC | PRN

## 2022-02-21 MED ORDER — PANTOPRAZOLE SODIUM 40 MG PO TBEC
40.0000 mg | DELAYED_RELEASE_TABLET | Freq: Two times a day (BID) | ORAL | Status: DC
  Administered 2022-02-22 – 2022-02-24 (×5): 40 mg via ORAL
  Filled 2022-02-21 (×5): qty 1

## 2022-02-21 MED ORDER — ACETAMINOPHEN 650 MG RE SUPP: 650.0000 mg | Freq: Four times a day (QID) | RECTAL | Status: DC | PRN

## 2022-02-21 MED ORDER — MIDODRINE HCL 5 MG PO TABS
5.0000 mg | ORAL_TABLET | Freq: Three times a day (TID) | ORAL | Status: DC
  Administered 2022-02-22 – 2022-02-24 (×8): 5 mg via ORAL
  Filled 2022-02-21 (×8): qty 1

## 2022-02-21 MED ORDER — ATORVASTATIN CALCIUM 20 MG PO TABS
40.0000 mg | ORAL_TABLET | Freq: Every day | ORAL | Status: DC
  Administered 2022-02-21 – 2022-02-24 (×4): 40 mg via ORAL
  Filled 2022-02-21 (×4): qty 2

## 2022-02-21 MED ORDER — DILTIAZEM HCL-DEXTROSE 125-5 MG/125ML-% IV SOLN (PREMIX)
5.0000 mg/h | INTRAVENOUS | Status: DC
  Administered 2022-02-21: 2.5 mg/h via INTRAVENOUS
  Filled 2022-02-21: qty 125

## 2022-02-21 MED ORDER — ONDANSETRON HCL 4 MG/2ML IJ SOLN: 4.0000 mg | Freq: Four times a day (QID) | INTRAMUSCULAR | Status: DC | PRN

## 2022-02-21 MED ORDER — ZOLPIDEM TARTRATE 5 MG PO TABS
5.0000 mg | ORAL_TABLET | Freq: Every evening | ORAL | Status: DC | PRN
  Administered 2022-02-21 – 2022-02-22 (×2): 5 mg via ORAL
  Filled 2022-02-21 (×2): qty 1

## 2022-02-21 MED ORDER — CITALOPRAM HYDROBROMIDE 20 MG PO TABS
20.0000 mg | ORAL_TABLET | Freq: Every day | ORAL | Status: DC
  Administered 2022-02-21 – 2022-02-24 (×4): 20 mg via ORAL
  Filled 2022-02-21 (×4): qty 1

## 2022-02-21 NOTE — ED Provider Notes (Signed)
? ?[facility] ?Provider Note ? ? ? Event Date/Time  ? First MD Initiated Contact with Patient 02/21/22 1628   ?  (approximate) ? ? ?History  ? ?Shortness of Breath ? ? ?HPI ? ?Danielle [name] is a 66 y.o. female who reports she has had shortness of breath with exertion since coming home from her last hospitalization.  She reports she is okay laying down but when she gets up and moves around she gets short of breath.  She is not having any chest tightness or nausea or any other symptoms.  She has a history of A-fib.  Patient has had to change her oxygen and put it up to 3 L from her usual 2.  Patient was at the kidney doctor today following up for acute kidney injury etc. and complained of increasing shortness of breath and was found to have A-fib with RVR.  EMS was called and she was sent to emergency department ?  ? ? ?Physical Exam  ? ?Triage Vital Signs: ?ED Triage Vitals  ?Enc Vitals Group  ?   BP --   ?   Pulse --   ?   Resp --   ?   Temp --   ?   Temp src --   ?   SpO2 02/21/22 1629 100 %  ?   Weight 02/21/22 1634 118 lb (53.5 kg)  ?   Height 02/21/22 1634 5\' 6"  (1.676 m)  ?   Head Circumference --   ?   Peak Flow --   ?   Pain Score 02/21/22 1634 0  ?   Pain Loc --   ?   Pain Edu? --   ?   Excl. in [place]? --   ? ? ?Most recent vital signs: ?Vitals:  ? 02/21/22 1751 02/21/22 1756  ?BP:    ?Pulse: 95 90  ?Resp: (!) 32 20  ?Temp:    ?SpO2: 91% 97%  ? ? ? ?General: Awake, no distress.  ?CV:  Good peripheral perfusion heart sounds like there is a regular rate and rhythm no audible murmurs it is tacky.  On the monitor and it shows A-fib though. ?Resp:  Normal effort.  Some crackles in the bases ?Abd:  No distention.  Soft nontender ?Extremities possible trace edema. ? ? ?ED Results / Procedures / Treatments  ? ?Labs ?(all labs ordered are listed, but only abnormal results are displayed) ?Labs Reviewed  ?BRAIN NATRIURETIC PEPTIDE - Abnormal; Notable for the following components:  ?    Result  Value  ? B Natriuretic Peptide 1,059.9 (*)   ? All other components within normal limits  ?COMPREHENSIVE METABOLIC PANEL - Abnormal; Notable for the following components:  ? Sodium 132 (*)   ? Chloride 97 (*)   ? BUN 39 (*)   ? Creatinine, Ser 2.00 (*)   ? GFR, Estimated 27 (*)   ? All other components within normal limits  ?CBC WITH DIFFERENTIAL/PLATELET - Abnormal; Notable for the following components:  ? RBC 3.55 (*)   ? Hemoglobin 10.9 (*)   ? HCT 33.8 (*)   ? All other components within normal limits  ?PROCALCITONIN  ?URINALYSIS, ROUTINE W REFLEX MICROSCOPIC  ?TROPONIN I (HIGH SENSITIVITY)  ?TROPONIN I (HIGH SENSITIVITY)  ? ? ? ?EKG ? ?EKG read and interpreted by me shows atrial fib or flutter at a rate of 113 normal axis no acute ST-T changes are obvious ? ?Heart monitor shows heart rates up to 136. ?  RADIOLOGY ?Chest x-ray read by radiology reviewed by me shows some increased haziness in the right lower lobe. ?CT of the chest read by me radiology has not read it yet.  Appears to show an increasing effusion in the right lower lobe which is causing the haziness. ? ? ?PROCEDURES: ? ?Critical Care performed: Critical care time 20 minutes.  This includes reviewing the patient's studies seeing the patient getting on the diltiazem drip and then talking to the hospitalist. ? ?Procedures ? ? ?MEDICATIONS ORDERED IN ED: ?Medications  ?diltiazem (CARDIZEM) 125 mg in dextrose 5% 125 mL (1 mg/mL) infusion (5 mg/hr Intravenous Rate/Dose Change 02/21/22 1751)  ?diltiazem (CARDIZEM) injection 10 mg (10 mg Intravenous Given 02/21/22 1715)  ? ? ? ?IMPRESSION / MDM / ASSESSMENT AND PLAN / ED COURSE  ?I reviewed the triage vital signs and the nursing notes. ?Patient with A-fib RVR.  Very little exertion at all makes her heart rate go up.  Dilt drip is being titrated upward to control this.  Patient does appear to have a worsening right-sided effusion.  She will have to be thoroughly evaluated to make sure that she does not also  have any kind of cardiac disease/ischemia. ? ? ? ? ?The patient is on the cardiac monitor to evaluate for evidence of arrhythmia and/or significant heart rate changes.  A-fib with RVR or a flutter with RVR showing. ? ?  ? ? ?FINAL CLINICAL IMPRESSION(S) / ED DIAGNOSES  ? ?Final diagnoses:  ?Dyspnea on exertion  ?Atrial fibrillation with RVR ([place])  ? ? ? ?Rx / DC Orders  ? ?ED Discharge Orders   ? ? None  ? ?  ? ? ? ?Note:  This document was prepared using Dragon voice recognition software and may include unintentional dictation errors. ?  ?Nena Polio, MD ?02/21/22 1808 ? ?

## 2022-02-21 NOTE — ED Notes (Signed)
Pt transported to CT via stretcher at this time.  

## 2022-02-21 NOTE — H&P (Signed)
?History and Physical  ? ? ?Danielle [name] KYH:[id number] DOB: 04-25-1956 DOA: 02/21/2022 ? ?PCP: [name], [place]  ?Patient coming from: Clinic  renal ? ?I have personally briefly reviewed patient's old medical records in [place] ? ?Chief Complaint: DOE/afib rvr  ? ?HPI: Danielle [name] is a 66 y.o. female with medical history significant  for COPD on 2 L home O2, CHFref 35%, CKD 4, remote history of breast cancer, history of lung cancer s/p xrt, esophageal cancers/p radiation, MGUS, depression, hypertension, hyperlipidemia, A-fib on Eliquis, who presents with shortness of breath BIB EMS from renal clinic with progressive DOE since her last hospitalization. On  evaluation  at renal clinic she was noted to have afib with rvr. Patient notes no fever/chills/cough wheezing ,or  presyncope.  She however notes right sided chest pain that started 8 days after moving heavy object. She notes no associated diaphoresis or other associated symptoms with this discomfort.  ? ? ?ED Course:  ?Afeb, bp 146/108, rr 22, hr 128 afib sat 100% on 3-4L up from based of 2L ?Procal 0.27 ?Bbo 1059 less than prior ?Labs: ?Wbc 6, hgb 10.9, plt195 ?NA 132 (143), K 3.7, cr 2 at baseline ?UAneg ?CT chest  ?IMPRESSION: ?1. Layering right pleural effusion with associated atelectasis, not ?significantly changed in appearance from CT last month. ?2. Two areas of masslike opacity in the right upper lobe. 1 of these ?areas appears stable, the other may be slightly smaller or ?unchanged, regardless there is no progression. These may represent ?areas of post radiation scarring. Continued oncologic follow-up is ?recommended. ?3. No new or acute airspace disease. ?4. Decreased size of subcarinal node from prior exam. ?5. Cardiomegaly with aortic atherosclerosis. Coronary artery ?calcifications. ? ?Tx: placed on cardizem drip  ?Review of Systems: As per HPI otherwise 10 point review of systems negative.  ? ?Past Medical History:  ?Diagnosis Date   ? Anemia   ? Anxiety   ? Aortic atherosclerosis ([place])   ? Aortic valve stenosis 02/10/2018  ? a.) TTE 02/10/2018: EF 55-60%: mild AS with MPG of 12 mmHg. b.) TTE 04/21/2018: EF 35-40%; mild AS with MPG 8 mmHg. c.) TTE 11/07/2019: EF 50-55%; mild AS with MPG 10 mmHg.  ? Arthritis   ? Atrial fibrillation ([place])   ? a.) CHA2DS2-VASc = 4 (age, sex, HTN, aortic plaque). b.) Rate/rhythm maintained on oral carvedilol; chronically anticoagulated on dose reduced apixaban. c.)  Attempted deployment of LAA occlusive device on 01/11/2021; parameters failed and procedure aborted.  ? Breast cancer, left ([place]) 2000  ? a.) T2N1M0; ER/PR (+) --> Tx'd with total mastectomy, LN resection, XRT, and chemotherapy  ? Cancer of right lung ([place]) 07/30/2016  ? a.) adenocarcinoma; ALK, ROS1, PDL1, BRAF, EGFR all negative.  ? Chronic diastolic CHF (congestive heart failure) ([place]) 11/26/2021  ? CKD (chronic kidney disease), stage IV ([place])   ? COPD (chronic obstructive pulmonary disease) ([place])   ? Dependence on supplemental oxygen   ? Depression   ? Diastolic dysfunction 15/17/6160  ? a.) TTE 02/10/2017: EF 55-60%; G2DD. b.) TTE 04/21/2018: EF 35-40%; mild LA dilation; mod MV regurgitation. c.) TTE 11/07/2019: EF 50-55%; G1DD.  ? DOE (dyspnea on exertion)   ? GERD (gastroesophageal reflux disease)   ? Heart murmur   ? History of 2019 novel coronavirus disease (COVID-19) 10/14/2019  ? HLD (hyperlipidemia)   ? Hypertension   ? Long term current use of anticoagulant   ? a.) apixaban  ? Lymphedema   ? Personal history  of chemotherapy   ? Personal history of radiation therapy   ? SBO (small bowel obstruction) ([place]) 11/05/2020  ? Vitamin D deficiency   ? ? ?Past Surgical History:  ?Procedure Laterality Date  ? Breast Biospy Left   ? [place]  ? BREAST SURGERY    ? COLONOSCOPY N/A 04/30/2018  ? Procedure: COLONOSCOPY;  Surgeon: Virgel Manifold, MD;  Location: [facility] ENDOSCOPY;  Service: Endoscopy;  Laterality: N/A;  ? COLONOSCOPY N/A 07/22/2018  ?  Procedure: COLONOSCOPY;  Surgeon: Virgel Manifold, MD;  Location[facility] ENDOSCOPY;  Service: Endoscopy;  Laterality: N/A;  ? COLONOSCOPY WITH PROPOFOL N/A 09/21/2021  ? Procedure: COLONOSCOPY WITH PROPOFOL;  Surgeon: Benjamine Sprague, DO;  Location: [facility] ENDOSCOPY;  Service: General;  Laterality: N/A;  ? DILATION AND CURETTAGE OF UTERUS    ? ELECTROMAGNETIC NAVIGATION BROCHOSCOPY Right 04/11/2016  ? Procedure: ELECTROMAGNETIC NAVIGATION BRONCHOSCOPY;  Surgeon: Vilinda Boehringer, MD;  Location: [facility] ORS;  Service: Cardiopulmonary;  Laterality: Right;  ? ESOPHAGOGASTRODUODENOSCOPY N/A 07/22/2018  ? Procedure: ESOPHAGOGASTRODUODENOSCOPY (EGD);  Surgeon: Virgel Manifold, MD;  Location: [facility] ENDOSCOPY;  Service: Endoscopy;  Laterality: N/A;  ? ESOPHAGOGASTRODUODENOSCOPY (EGD) WITH PROPOFOL N/A 05/07/2018  ? Procedure: ESOPHAGOGASTRODUODENOSCOPY (EGD) WITH PROPOFOL;  Surgeon: Lucilla Lame, MD;  Location: [facility] ENDOSCOPY;  Service: Endoscopy;  Laterality: N/A;  ? ESOPHAGOGASTRODUODENOSCOPY (EGD) WITH PROPOFOL N/A 04/24/2019  ? Procedure: ESOPHAGOGASTRODUODENOSCOPY (EGD) WITH PROPOFOL;  Surgeon: Jonathon Bellows, MD;  Location: [facility] ENDOSCOPY;  Service: Gastroenterology;  Laterality: N/A;  ? ESOPHAGOGASTRODUODENOSCOPY (EGD) WITH PROPOFOL N/A 01/12/2020  ? Procedure: ESOPHAGOGASTRODUODENOSCOPY (EGD) WITH PROPOFOL;  Surgeon: Jonathon Bellows, MD;  Location[facility] ENDOSCOPY;  Service: Gastroenterology;  Laterality: N/A;  ? ESOPHAGOGASTRODUODENOSCOPY (EGD) WITH PROPOFOL N/A 04/28/2020  ? Procedure: ESOPHAGOGASTRODUODENOSCOPY (EGD) WITH PROPOFOL;  Surgeon: Jonathon Bellows, MD;  Location: [facility] ENDOSCOPY;  Service: Gastroenterology;  Laterality: N/A;  ? EUS N/A 05/07/2019  ? Procedure: FULL UPPER ENDOSCOPIC ULTRASOUND (EUS) RADIAL;  Surgeon: Jola Schmidt, MD;  Location: [facility] ENDOSCOPY;  Service: Endoscopy;  Laterality: N/A;  ? ILEOSCOPY N/A 07/22/2018  ? Procedure: ILEOSCOPY THROUGH STOMA;  Surgeon: Virgel Manifold, MD;  Location: [facility]  ENDOSCOPY;  Service: Endoscopy;  Laterality: N/A;  ? ILEOSTOMY    ? ILEOSTOMY N/A 09/08/2018  ? Procedure: ILEOSTOMY REVISION POSSIBLE CREATION;  Surgeon: Herbert Pun, MD;  Location: [facility] ORS;  Service: General;  Laterality: N/A;  ? ILEOSTOMY CLOSURE N/A 08/15/2018  ? Procedure: DILATION OF ILEOSTOMY STRICTURE;  Surgeon: Herbert Pun, MD;  Location: [facility] ORS;  Service: General;  Laterality: N/A;  ? LAPAROTOMY Right 05/04/2018  ? Procedure: EXPLORATORY LAPAROTOMY right colectomy right and left ostomy;  Surgeon: Herbert Pun, MD;  Location: [facility] ORS;  Service: General;  Laterality: Right;  ? LEFT ATRIAL APPENDAGE OCCLUSION N/A 01/11/2021  ? Procedure: LEFT ATRIAL APPENDAGE OCCLUSION ([name]); ABORTED PROCEDURE WITHOUT DEVICE BEING IMPLANTED; Location: [facility]; Surgeon: Mylinda Latina, MD  ? LUNG BIOPSY    ? MASTECTOMY Left   ? 2000, [facility]  ? ROTATOR CUFF REPAIR Right   ? [place]  ? XI ROBOTIC ASSISTED COLOSTOMY TAKEDOWN N/A 10/23/2021  ? Procedure: XI ROBOTIC ASSISTED ILEOSTOMY TAKEDOWN;  Surgeon: Herbert Pun, MD;  Location: [facility] ORS;  Service: General;  Laterality: N/A;  180 minutes for the surgery part please  ? ? ? reports that she quit smoking about 9 years ago. Her smoking use included cigarettes. She has a 10.00 pack-year smoking history. Her smokeless tobacco use includes snuff. She reports current alcohol use. She reports that she does not use drugs. ? ?No  Known Allergies ? ?Family History  ?Problem Relation Age of Onset  ? Breast cancer Mother 49  ? Cancer Mother   ?     Breast   ? Cirrhosis Father   ? Breast cancer Paternal Aunt 99  ? Cancer Maternal Aunt   ?     Breast   ? ? ?Prior to Admission medications   ?Medication Sig Start Date End Date Taking? Authorizing Provider  ?acetaminophen (TYLENOL) 325 MG tablet Take 2 tablets (650 mg total) by mouth every 6 (six) hours as needed for mild pain (or Fever >/= 101). ?Patient taking differently: Take 650 mg by mouth every 4  (four) hours as needed for moderate pain. 12/24/18   Gouru, Illene Silver, MD  ?albuterol (PROVENTIL HFA;VENTOLIN HFA) 108 (90 Base) MCG/ACT inhaler Inhale 2 puffs into the lungs every 6 (six) hours as needed for [employer]

## 2022-02-21 NOTE — ED Notes (Signed)
Lab called to add on procalcitonin.  ?

## 2022-02-21 NOTE — ED Notes (Signed)
Informed pt that the lidocaine patch and Eliquis are pending verification by pharmacy, pt verbalized understanding ?

## 2022-02-21 NOTE — ED Triage Notes (Signed)
Pt BIB [facility] from "kidney doctor" C/O gradually worsening exertional SOB X 1 month. Pt also reports R chest pain when taking deep breaths or when lying on side. Chronic O2 at 2 L [place], recently increased to 3 L because pt felt like she was not getting enough O2. ? ? ? ?

## 2022-02-21 NOTE — ED Notes (Signed)
Pt assisted in using bedpan. While turning over in bed HR increased to 130s. Dilt gtt increased to 5 mg/hr. Will continue to monitor. MD notified.  ?

## 2022-02-22 ENCOUNTER — Other Ambulatory Visit: Payer: Self-pay

## 2022-02-22 DIAGNOSIS — I484 Atypical atrial flutter: Secondary | ICD-10-CM | POA: Diagnosis not present

## 2022-02-22 DIAGNOSIS — J90 Pleural effusion, not elsewhere classified: Secondary | ICD-10-CM | POA: Diagnosis present

## 2022-02-22 DIAGNOSIS — Z87891 Personal history of nicotine dependence: Secondary | ICD-10-CM | POA: Diagnosis not present

## 2022-02-22 DIAGNOSIS — D472 Monoclonal gammopathy: Secondary | ICD-10-CM | POA: Diagnosis present

## 2022-02-22 DIAGNOSIS — Z8616 Personal history of COVID-19: Secondary | ICD-10-CM | POA: Diagnosis not present

## 2022-02-22 DIAGNOSIS — E785 Hyperlipidemia, unspecified: Secondary | ICD-10-CM | POA: Diagnosis present

## 2022-02-22 DIAGNOSIS — I480 Paroxysmal atrial fibrillation: Secondary | ICD-10-CM | POA: Diagnosis present

## 2022-02-22 DIAGNOSIS — I5023 Acute on chronic systolic (congestive) heart failure: Secondary | ICD-10-CM | POA: Diagnosis present

## 2022-02-22 DIAGNOSIS — Z7901 Long term (current) use of anticoagulants: Secondary | ICD-10-CM | POA: Diagnosis not present

## 2022-02-22 DIAGNOSIS — J449 Chronic obstructive pulmonary disease, unspecified: Secondary | ICD-10-CM | POA: Diagnosis present

## 2022-02-22 DIAGNOSIS — Z9221 Personal history of antineoplastic chemotherapy: Secondary | ICD-10-CM | POA: Diagnosis not present

## 2022-02-22 DIAGNOSIS — Z9012 Acquired absence of left breast and nipple: Secondary | ICD-10-CM | POA: Diagnosis not present

## 2022-02-22 DIAGNOSIS — I4892 Unspecified atrial flutter: Secondary | ICD-10-CM | POA: Diagnosis present

## 2022-02-22 DIAGNOSIS — Z85118 Personal history of other malignant neoplasm of bronchus and lung: Secondary | ICD-10-CM | POA: Diagnosis not present

## 2022-02-22 DIAGNOSIS — Z923 Personal history of irradiation: Secondary | ICD-10-CM | POA: Diagnosis not present

## 2022-02-22 DIAGNOSIS — N184 Chronic kidney disease, stage 4 (severe): Secondary | ICD-10-CM | POA: Diagnosis present

## 2022-02-22 DIAGNOSIS — I130 Hypertensive heart and chronic kidney disease with heart failure and stage 1 through stage 4 chronic kidney disease, or unspecified chronic kidney disease: Secondary | ICD-10-CM | POA: Diagnosis present

## 2022-02-22 DIAGNOSIS — Z79899 Other long term (current) drug therapy: Secondary | ICD-10-CM | POA: Diagnosis not present

## 2022-02-22 DIAGNOSIS — K219 Gastro-esophageal reflux disease without esophagitis: Secondary | ICD-10-CM | POA: Diagnosis present

## 2022-02-22 DIAGNOSIS — I4891 Unspecified atrial fibrillation: Secondary | ICD-10-CM | POA: Diagnosis present

## 2022-02-22 DIAGNOSIS — Z9981 Dependence on supplemental oxygen: Secondary | ICD-10-CM | POA: Diagnosis not present

## 2022-02-22 DIAGNOSIS — J9611 Chronic respiratory failure with hypoxia: Secondary | ICD-10-CM | POA: Diagnosis present

## 2022-02-22 DIAGNOSIS — Z853 Personal history of malignant neoplasm of breast: Secondary | ICD-10-CM | POA: Diagnosis not present

## 2022-02-22 DIAGNOSIS — Z803 Family history of malignant neoplasm of breast: Secondary | ICD-10-CM | POA: Diagnosis not present

## 2022-02-22 LAB — COMPREHENSIVE METABOLIC PANEL
ALT: 12 U/L (ref 0–44)
AST: 20 U/L (ref 15–41)
Albumin: 3.2 g/dL — ABNORMAL LOW (ref 3.5–5.0)
Alkaline Phosphatase: 55 U/L (ref 38–126)
Anion gap: 7 (ref 5–15)
BUN: 38 mg/dL — ABNORMAL HIGH (ref 8–23)
CO2: 26 mmol/L (ref 22–32)
Calcium: 9 mg/dL (ref 8.9–10.3)
Chloride: 101 mmol/L (ref 98–111)
Creatinine, Ser: 1.92 mg/dL — ABNORMAL HIGH (ref 0.44–1.00)
GFR, Estimated: 28 mL/min — ABNORMAL LOW (ref >60)
Glucose, Bld: 78 mg/dL (ref 70–99)
Potassium: 3.8 mmol/L (ref 3.5–5.1)
Sodium: 134 mmol/L — ABNORMAL LOW (ref 135–145)
Total Bilirubin: 0.9 mg/dL (ref 0.3–1.2)
Total Protein: 6.8 g/dL (ref 6.5–8.1)

## 2022-02-22 LAB — CBC
HCT: 29.5 % — ABNORMAL LOW (ref 36.0–46.0)
Hemoglobin: 9.4 g/dL — ABNORMAL LOW (ref 12.0–15.0)
MCH: 30.1 pg (ref 26.0–34.0)
MCHC: 31.9 g/dL (ref 30.0–36.0)
MCV: 94.6 fL (ref 80.0–100.0)
Platelets: 190 10*3/uL (ref 150–400)
RBC: 3.12 MIL/uL — ABNORMAL LOW (ref 3.87–5.11)
RDW: 14.7 % (ref 11.5–15.5)
WBC: 4.9 10*3/uL (ref 4.0–10.5)
nRBC: 0 % (ref 0.0–0.2)

## 2022-02-22 LAB — PROTIME-INR
INR: 1.3 — ABNORMAL HIGH (ref 0.8–1.2)
Prothrombin Time: 15.7 seconds — ABNORMAL HIGH (ref 11.4–15.2)

## 2022-02-22 LAB — TSH: TSH: 1.323 u[IU]/mL (ref 0.350–4.500)

## 2022-02-22 MED ORDER — IPRATROPIUM-ALBUTEROL 0.5-2.5 (3) MG/3ML IN SOLN
3.0000 mL | RESPIRATORY_TRACT | Status: DC | PRN
Start: 2022-02-22 — End: 2022-02-24

## 2022-02-22 MED ORDER — UMECLIDINIUM BROMIDE 62.5 MCG/ACT IN AEPB
1.0000 | INHALATION_SPRAY | Freq: Every day | RESPIRATORY_TRACT | Status: DC
  Administered 2022-02-24: 1 via RESPIRATORY_TRACT

## 2022-02-22 MED ORDER — HYDRALAZINE HCL 20 MG/ML IJ SOLN
10.0000 mg | INTRAMUSCULAR | Status: DC | PRN
Start: 2022-02-22 — End: 2022-02-24

## 2022-02-22 MED ORDER — FLUTICASONE FUROATE-VILANTEROL 100-25 MCG/ACT IN AEPB
1.0000 | INHALATION_SPRAY | Freq: Every day | RESPIRATORY_TRACT | Status: DC
  Administered 2022-02-24: 1 via RESPIRATORY_TRACT
  Filled 2022-02-22: qty 28

## 2022-02-22 MED ORDER — SENNOSIDES-DOCUSATE SODIUM 8.6-50 MG PO TABS
1.0000 | ORAL_TABLET | Freq: Every evening | ORAL | Status: DC | PRN
Start: 2022-02-22 — End: 2022-02-24

## 2022-02-22 MED ORDER — TRAZODONE HCL 50 MG PO TABS
50.0000 mg | ORAL_TABLET | Freq: Every evening | ORAL | Status: DC | PRN
  Administered 2022-02-23: 50 mg via ORAL
  Filled 2022-02-22: qty 1

## 2022-02-22 MED ORDER — METOPROLOL TARTRATE 5 MG/5ML IV SOLN: 5.0000 mg | INTRAVENOUS | Status: DC | PRN

## 2022-02-22 NOTE — Progress Notes (Signed)
?PROGRESS NOTE ? ? ? ?Danielle [name]  EKC:[id number] DOB: 04/08/1956 DOA: 02/21/2022 ?PCP: [name], [employer]  ? ?Brief Narrative:  ?66 year old with history of COPD on 2 L nasal cannula, systolic CHF EF 50%, CKD stage IV, remote history of breast cancer, lung cancer status post radiation, esophageal cancer status postradiation, MGUS, depression, HTN, HLD, A-fib on Eliquis admitted for shortness of breath.  She was found to be in atrial fibrillation with RVR.  CT scan in the hospital showed right-sided stable pleural effusion. ? ? ?Assessment & Plan: ? Principal Problem: ?  Atrial flutter ([place]) ?  ?Atrial fibrillation/flutter with RVR ?- Initially required Cardizem drip now it has been discontinued.  Currently on oral medications- Amiodarone, metoprolol twice daily ?- Continue Eliquis ?- Cardiology team consulted. ? ?CKD stage IV ?- Creatinine is stable continue to monitor ? ?Essential hypertension ?-Continue Lopressor, torsemide ? ?chronic congestive heart failure with reduced ejection fraction, EF 35%.   ?-Continue home torsemide, Lopressor twice daily ? ?History of COPD with chronic hypoxia on 2 L nasal cannula ?- Currently patient is on 2 L nasal cannula.  Continue to monitor this. ? ?History of lung cancer/breast cancer ?- Follows outpatient oncology. ? ?PT/OT is recommended home health ? ? ?DVT prophylaxis: On Eliquis ?Code Status: Full code ?Family Communication:   ? ?Off-and-on still having spikes of elevated heart rate.  Cardiology team following.  Maintain hospital stay for least next 24 hours to watch her and adjust medications as appropriate. ? ? ?Subjective: ?Patient seen and examined at bedside, this morning she was rate controlled but still has some exertional dyspnea and infrequently getting spikes of elevated heart rate. ? ? ?Examination: ? ?General exam: Appears calm and comfortable, 2 L nasal cannula ?Respiratory system: Clear to auscultation. Respiratory effort normal. ?Cardiovascular system:  Irregularly irregular heart rate ?Gastrointestinal system: Abdomen is nondistended, soft and nontender. No organomegaly or masses felt. Normal bowel sounds heard. ?Central nervous system: Alert and oriented. No focal neurological deficits. ?Extremities: Symmetric 5 x 5 power. ?Skin: No rashes, lesions or ulcers ?Psychiatry: Judgement and insight appear normal. Mood & affect appropriate.  ? ? ? ?Objective: ?Vitals:  ? 02/22/22 0800 02/22/22 0914 02/22/22 1130 02/22/22 1209  ?BP: 115/78 111/78 123/66   ?Pulse: 74 72 (!) 58 (!) 110  ?Resp: (!) 21  19   ?Temp:      ?TempSrc:      ?SpO2: 100%  100% 100%  ?Weight:      ?Height:      ? ? ?Intake/Output Summary (Last 24 hours) at 02/22/2022 1251 ?Last data filed at 02/22/2022 1138 ?Gross per 24 hour  ?Intake 52.23 ml  ?Output --  ?Net 52.23 ml  ? ?Filed Weights  ? 02/21/22 1634  ?Weight: 53.5 kg  ? ? ? ?Data Reviewed:  ? ?CBC: ?Recent Labs  ?Lab 02/21/22 ?1636 02/22/22 ?0610  ?WBC 6.0 4.9  ?NEUTROABS 3.7  --   ?HGB 10.9* 9.4*  ?HCT 33.8* 29.5*  ?MCV 95.2 94.6  ?PLT 195 190  ? ?Basic Metabolic Panel: ?Recent Labs  ?Lab 02/21/22 ?1636 02/22/22 ?0610  ?NA 132* 134*  ?K 3.7 3.8  ?CL 97* 101  ?CO2 24 26  ?GLUCOSE 73 78  ?BUN 39* 38*  ?CREATININE 2.00* 1.92*  ?CALCIUM 9.4 9.0  ? ?GFR: ?Estimated Creatinine Clearance: 24 mL/min (A) (by C-G formula based on SCr of 1.92 mg/dL (H)). ?Liver Function Tests: ?Recent Labs  ?Lab 02/21/22 ?1636 02/22/22 ?0610  ?AST 21 20  ?ALT 13 12  ?  ALKPHOS 68 55  ?BILITOT 0.7 0.9  ?PROT 8.0 6.8  ?ALBUMIN 3.9 3.2*  ? ?No results for input(s): LIPASE, AMYLASE in the last 168 hours. ?No results for input(s): AMMONIA in the last 168 hours. ?Coagulation Profile: ?No results for input(s): INR, PROTIME in the last 168 hours. ?Cardiac Enzymes: ?No results for input(s): CKTOTAL, CKMB, CKMBINDEX, TROPONINI in the last 168 hours. ?BNP (last 3 results) ?No results for input(s): PROBNP in the last 8760 hours. ?HbA1C: ?No results for input(s): HGBA1C in the last 72  hours. ?CBG: ?No results for input(s): GLUCAP in the last 168 hours. ?Lipid Profile: ?No results for input(s): CHOL, HDL, LDLCALC, TRIG, CHOLHDL, LDLDIRECT in the last 72 hours. ?Thyroid Function Tests: ?No results for input(s): TSH, T4TOTAL, FREET4, T3FREE, THYROIDAB in the last 72 hours. ?Anemia Panel: ?No results for input(s): VITAMINB12, FOLATE, FERRITIN, TIBC, IRON, RETICCTPCT in the last 72 hours. ?Sepsis Labs: ?Recent Labs  ?Lab 02/21/22 ?1642  ?PROCALCITON 0.27  ? ? ?No results found for this or any previous visit (from the past 240 hour(s)).  ? ? ? ? ? ?Radiology Studies: ?CT Chest Wo Contrast ? ?Result Date: 02/21/2022 ?CLINICAL DATA:  Chest wall pain, nontraumatic, infection or inflammation suspected, xray done Patient with history of multiple cancers. Patient has increasing pain right chest EXAM: CT CHEST WITHOUT CONTRAST TECHNIQUE: Multidetector CT imaging of the chest was performed following the standard protocol without IV contrast. RADIATION DOSE REDUCTION: This exam was performed according to the departmental dose-optimization program which includes automated exposure control, adjustment of the mA and/or kV according to patient size and/or use of iterative reconstruction technique. COMPARISON:  Radiograph earlier today. Chest CT 01/26/2022, additional prior chest CT reviewed FINDINGS: Cardiovascular: Moderately advanced aortic atherosclerosis. No aortic aneurysm. Mild cardiomegaly. There are coronary artery calcifications. Mitral annulus calcifications. Dilated central pulmonary arteries, main pulmonary artery measures 3.6 cm, right pulmonary artery measures 3.1 cm, left pulmonary artery measures 3.2 cm. There is no pericardial effusion. Mediastinum/Nodes: Decreased subcarinal node currently measuring 14 mm, previously 16 mm, series 3, image 70. Additional scattered small mediastinal lymph nodes are not enlarged by size criteria. Lack of IV contrast significantly limits assessment for hilar  adenopathy. Small hiatal hernia with patulous esophagus. Lungs/Pleura: Moderate emphysema. Layering right pleural effusion with associated atelectasis, not significantly changed in appearance from prior CT. Irregular nodular opacity in the right upper lobe measuring 2.4 x 1.5 cm, series 4, image 59, not significantly changed on my retrospective measurement. Masslike opacity in the anterior right upper lobe measures 3.7 x 2 cm, series 4, image 69, possibly smaller versus stable allowing for differences in caliper placement in slice selection. Calcified left apical pleuroparenchymal thickening is stable in related scarring. There is no acute airspace disease or new pulmonary nodule. No left pleural effusion. Stable areas of subpleural thickening in the anterior left lung. Upper Abdomen: Stable 11 mm water density lesion in the left lobe of the liver, not metabolic in prior PET and consistent with cyst. Dense upper abdominal aortic atherosclerosis. No acute upper abdominal findings. Musculoskeletal: Stable concavity of L2 superior endplate. No focal rib lesion or rib fracture to explain right-sided pain. There are remote left anterior rib fractures. Left mastectomy. Chest wall soft tissue abnormalities. IMPRESSION: 1. Layering right pleural effusion with associated atelectasis, not significantly changed in appearance from CT last month. 2. Two areas of masslike opacity in the right upper lobe. 1 of these areas appears stable, the other may be slightly smaller or unchanged, regardless there is no  progression. These may represent areas of post radiation scarring. Continued oncologic follow-up is recommended. 3. No new or acute airspace disease. 4. Decreased size of subcarinal node from prior exam. 5. Cardiomegaly with aortic atherosclerosis. Coronary artery calcifications. Aortic Atherosclerosis (ICD10-I70.0) and Emphysema (ICD10-J43.9). Electronically Signed   By: Keith Rake M.D.   On: 02/21/2022 18:04  ? ?DG  Chest Portable 1 View ? ?Result Date: 02/21/2022 ?CLINICAL DATA:  Short of breath EXAM: PORTABLE CHEST 1 VIEW COMPARISON:  02/01/2022 FINDINGS: Progression of right lower lobe airspace disease and small right effusion. Ri

## 2022-02-22 NOTE — ED Notes (Signed)
NP Sharion Settler messaged regarding pt's HR and BP as well as the discontinuation of the diltiazem gtt ?

## 2022-02-22 NOTE — Plan of Care (Signed)
  Problem: Education: Goal: Knowledge of disease or condition will improve Outcome: Progressing Goal: Understanding of medication regimen will improve Outcome: Progressing Goal: Individualized Educational Video(s) Outcome: Progressing   Problem: Activity: Goal: Ability to tolerate increased activity will improve Outcome: Progressing   Problem: Cardiac: Goal: Ability to achieve and maintain adequate cardiopulmonary perfusion will improve Outcome: Progressing   Problem: Health Behavior/Discharge Planning: Goal: Ability to safely manage health-related needs after discharge will improve Outcome: Progressing   

## 2022-02-22 NOTE — ED Notes (Signed)
Pt ambulated to BR with steady gait using walker. Tolerated well. Pt given cup of coffee and lunch tray per request. ?

## 2022-02-22 NOTE — ED Notes (Signed)
PT at bedside assessing pt at this time.  ?

## 2022-02-22 NOTE — Evaluation (Signed)
Physical Therapy Evaluation ?Patient Details ?Name: Danielle [name] ?MRN: [id number] ?DOB: [birth year] ?Today's Date: 02/22/2022 ? ?History of Present Illness ? 66 yo female with pmh that includes advanced COPD, chronic kidney disease, atrial fib, mild aortic stenosis, h.o of lung cancer and esophageal cancer presenting with SOB in the setting of atrial flutter with RVR.  ?Clinical Impression ? Pt presents sleeping in bed and easy to wake and cooperative to participate in PT. Pt exhibits decreased activity tolerance with increased HR briefly into 110 range accompanied by SOB. However, HR remained mostly stable at 88 throughout walking bout. She was able to complete 100 ft with mod I and intermittent CGA when completing turns. Pt is able to discharge home safely with additional HHPT for improved home safety setup especially with negotiating stairs while using portable 02.   ?   ? ?Recommendations for follow up therapy are one component of a multi-disciplinary discharge planning process, led by the attending physician.  Recommendations may be updated based on patient status, additional functional criteria and insurance authorization. ? ?Follow Up Recommendations Home health PT ? ?  ?Assistance Recommended at Discharge Intermittent Supervision/Assistance  ?Patient can return home with the following ? A little help with bathing/dressing/bathroom;A lot of help with bathing/dressing/bathroom;Assistance with cooking/housework ? ?  ?Equipment Recommendations Rolling walker (2 wheels)  ?Recommendations for Other Services ?    ?  ?Functional Status Assessment Patient has had a recent decline in their functional status and demonstrates the ability to make significant improvements in function in a reasonable and predictable amount of time.  ? ?  ?Precautions / Restrictions Restrictions ?Weight Bearing Restrictions: No  ? ?  ? ?Mobility ? Bed Mobility ?Overal bed mobility: Modified Independent ?  ?  ?  ?  ?  ?  ?General bed mobility  comments: Increased time for setup ?Patient Response: Cooperative ? ?Transfers ?Overall transfer level: Modified independent ?Equipment used: Standard walker ?Transfers: Sit to/from Stand ?Sit to Stand: Supervision ?  ?  ?  ?  ?  ?  ?  ? ?Ambulation/Gait ?Ambulation/Gait assistance: Modified independent (Device/Increase time) ?Gait Distance (Feet): 100 Feet ?Assistive device: Rolling walker (2 wheels) ?Gait Pattern/deviations: Step-through pattern, Narrow base of support ?Gait velocity: decreased ?  ?  ?General Gait Details: 50 ft HR increased to 110. Pt reporting SOB throughout ? ?Stairs ?  ?  ?  ?  ?  ? ?Wheelchair Mobility ?  ? ?Modified Rankin (Stroke Patients Only) ?  ? ?  ? ?Balance Overall balance assessment: Modified Independent ?Sitting-balance support: No upper extremity supported ?Sitting balance-Leahy Scale: Good ?  ?  ?Standing balance support: Bilateral upper extremity supported ?Standing balance-Leahy Scale: Good ?Standing balance comment: BUE support using RW ?  ?  ?  ?  ?  ?  ?  ?  ?  ?  ?  ?   ? ? ? ?Pertinent Vitals/Pain Pain Assessment ?Pain Assessment: No/denies pain  ? ? ?Home Living Family/patient expects to be discharged to:: Private residence ?Living Arrangements: Children;Other relatives ?Available Help at Discharge: Family ?Type of Home: Mobile home ?Home Access: Stairs to enter ?Entrance Stairs-Rails: Right (Reports that railing is difficult to use) ?Entrance Stairs-Number of Steps: 4-5 ?  ?Home Layout: One level ?Home Equipment: Cane - single Barista (2 wheels);BSC/3in1 ?Additional Comments: Has O2 tank for home use, but says it is difficult to transport upstairs into home. As a result, she sometimes goes without O2.  ?  ?Prior Function Prior Level of Function : Independent/Modified  Independent ?  ?  ?  ?  ?  ?  ?Mobility Comments: Pt reports that she does not use AD within house. ?ADLs Comments: Frances Maywood help with meals and bathing from granddaughter. Granddaughter and son  work during day and they are not available to provide care ?  ? ? ?Hand Dominance  ?   ? ?  ?Extremity/Trunk Assessment  ? Upper Extremity Assessment ?Upper Extremity Assessment: Overall WFL for tasks assessed ?  ? ?Lower Extremity Assessment ?Lower Extremity Assessment: Overall WFL for tasks assessed ?  ? ?Cervical / Trunk Assessment ?Cervical / Trunk Assessment: Normal  ?Communication  ? Communication: No difficulties  ?Cognition Arousal/Alertness: Awake/alert ?Behavior During Therapy: [facility] for tasks assessed/performed ?Overall Cognitive Status: Within Functional Limits for tasks assessed ?  ?  ?  ?  ?  ?  ?  ?  ?  ?  ?  ?  ?  ?  ?  ?  ?  ?  ?  ? ?  ?General Comments General comments (skin integrity, edema, etc.): On 3L [place] throughout SpO2 maintained at 94%. HR reached 110 at 50 ft ? ?  ?Exercises    ? ?Assessment/Plan  ?  ?PT Assessment Patient needs continued PT services  ?PT Problem List Decreased strength;Decreased activity tolerance ? ?   ?  ?PT Treatment Interventions DME instruction;Therapeutic exercise;Balance training;Neuromuscular re-education;Therapeutic activities;Patient/family education;Manual techniques;Gait training;Stair training   ? ?PT Goals (Current goals can be found in the Care Plan section)  ?Acute Rehab PT Goals ?Patient Stated Goal: to go home ?PT Goal Formulation: With patient ?Time For Goal Achievement: 03/08/22 ?Potential to Achieve Goals: Good ? ?  ?Frequency Min 2X/week ?  ? ? ?Co-evaluation   ?  ?  ?  ?  ? ? ?  ?AM-PAC PT "6 Clicks" Mobility  ?Outcome Measure Help needed turning from your back to your side while in a flat bed without using bedrails?: None ?Help needed moving from lying on your back to sitting on the side of a flat bed without using bedrails?: None ?Help needed moving to and from a bed to a chair (including a wheelchair)?: None ?Help needed standing up from a chair using your arms (e.g., wheelchair or bedside chair)?: None ?Help needed to walk in hospital room?: A  Little ?Help needed climbing 3-5 steps with a railing? : A Lot ?6 Click Score: 21 ? ?  ?End of Session Equipment Utilized During Treatment: Gait belt;Oxygen ?Activity Tolerance: Patient limited by fatigue ?Patient left: in bed;with call bell/phone within reach ?Nurse Communication: Mobility status ?PT Visit Diagnosis: Other abnormalities of gait and mobility (R26.89);Muscle weakness (generalized) (M62.81) ?  ? ?Time: 7096-2836 ?PT Time Calculation (min) (ACUTE ONLY): 40 min ? ? ?Charges:   PT Evaluation ?$PT Eval Low Complexity: 1 Low ?PT Treatments ?$Gait Training: 8-22 mins ?  ?   ? ?Daneil Dan pt DPT  ?02/22/2022, 12:30 PM ? ?

## 2022-02-22 NOTE — ED Notes (Signed)
Cardiology PA at bedside assessing pt at this time.  ?

## 2022-02-22 NOTE — ED Notes (Signed)
Informed RN bed assigned 

## 2022-02-22 NOTE — Consult Note (Addendum)
[facility] CLINIC CARDIOLOGY CONSULT NOTE       Patient ID: Danielle [name] MRN: [id number] DOB/AGE: 01-24-1956 66 y.o.  Admit date: 02/21/2022 Referring Physician Dr. Stephania Fragmin Primary Physician [facility] health center Primary Cardiologist Dr. Gwen Pounds Reason for Consultation atrial flutter with RVR  HPI: Danielle [name] is a 66 year old female with a past medical history of advanced COPD on as needed 2L, paroxysmal atrial fibrillation/flutter on dose reduced eliquis, HFrEF (LVEF 35-40%, g2dd, mod-sev TR, mod AS, mod MS 01/2022), CKD (baseline Cr. 2-2.5), remote history of lung cancer s/p radiation, esophageal cancer s/p radiation, MGUS, who presented to [facility] ED on 02/21/2022 from her nephrologists office with progressively worsening dyspnea on exertion since discharge from her last admission on 02/04/22. Cardiology is consulted for assistance with her atrial flutter with RVR.   She was recently seen in her outpatient cardiology office 4/27 where metoprolol tartrate was increased to 50 mg twice daily for better heart rate control, she was continued on amiodarone 200mg  BID, eliquis 2.5mg  BID, and torsemide 40mg  daily. GDMT titration was limited by concerns for hypotension.  She presents today with progressively worsening shortness of breath over the past 2 weeks with associated heart racing with very minimal exertion.  She says she could walk only several feet without having to sit down and put her oxygen on and to get her heart rate under control.  She admits to some right-sided pleuritic chest discomfort that is worse when she moves her right arm, but denies other chest pain.  She has some positional dizziness, but denies presyncope or syncope, orthopnea or lower extremity edema.  Denies fever, chills, cough.  Notably, she admits to compliance with her medications including her Eliquis twice daily.  On the day of admission she was started on a diltiazem drip with good control of her heart rate.   Review of telemetry shows heart rate mostly in the 60s to 80s with a period of tachycardia up to 120.  Recent vitals are notable for a blood pressure of 115/75.  She is on 3 L oxygen by nasal cannula during interview, she remains in atrial fibrillation on telemetry with heart rates between 66-80 during interview.  Labs on admission are notable for a potassium of 3.8, BUN/creatinine 38/1.92 EGFR 28.  BNP elevated to 1059.9 high-sensitivity troponin negative at 14-11 procalcitonin low at 0.27, H&H around baseline at 9.4/29.5.  Platelets 190.  TSH normal at 1.3 chest x-ray with aggressive right lower lobe airspace disease and small right pleural effusion with possible pneumonia.  CT chest with layering right pleural effusion and associated atelectasis, feeling changed to areas of masslike opacity in the right upper lobe which oncologist follow-up was recommended, no new or acute airspace disease seen  Review of systems complete and found to be negative unless listed above     Past Medical History:  Diagnosis Date   Anemia    Anxiety    Aortic atherosclerosis (HCC)    Aortic valve stenosis 02/10/2018   a.) TTE 02/10/2018: EF 55-60%: mild AS with MPG of 12 mmHg. b.) TTE 04/21/2018: EF 35-40%; mild AS with MPG 8 mmHg. c.) TTE 11/07/2019: EF 50-55%; mild AS with MPG 10 mmHg.   Arthritis    Atrial fibrillation (HCC)    a.) CHA2DS2-VASc = 4 (age, sex, HTN, aortic plaque). b.) Rate/rhythm maintained on oral carvedilol; chronically anticoagulated on dose reduced apixaban. c.)  Attempted deployment of LAA occlusive device on 01/11/2021; parameters failed and procedure aborted.   Breast cancer, left (  HCC) 2000   a.) T2N1M0; ER/PR (+) --> Tx'd with total mastectomy, LN resection, XRT, and chemotherapy   Cancer of right lung (HCC) 07/30/2016   a.) adenocarcinoma; ALK, ROS1, PDL1, BRAF, EGFR all negative.   Chronic diastolic CHF (congestive heart failure) (HCC) 11/26/2021   CKD (chronic kidney disease),  stage IV (HCC)    COPD (chronic obstructive pulmonary disease) (HCC)    Dependence on supplemental oxygen    Depression    Diastolic dysfunction 02/10/2017   a.) TTE 02/10/2017: EF 55-60%; G2DD. b.) TTE 04/21/2018: EF 35-40%; mild LA dilation; mod MV regurgitation. c.) TTE 11/07/2019: EF 50-55%; G1DD.   DOE (dyspnea on exertion)    GERD (gastroesophageal reflux disease)    Heart murmur    History of 2019 novel coronavirus disease (COVID-19) 10/14/2019   HLD (hyperlipidemia)    Hypertension    Long term current use of anticoagulant    a.) apixaban   Lymphedema    Personal history of chemotherapy    Personal history of radiation therapy    SBO (small bowel obstruction) (HCC) 11/05/2020   Vitamin D deficiency     Past Surgical History:  Procedure Laterality Date   Breast Biospy Left    [facility]   BREAST SURGERY     COLONOSCOPY N/A 04/30/2018   Procedure: COLONOSCOPY;  Surgeon: Pasty Spillers, MD;  Location: [facility] ENDOSCOPY;  Service: Endoscopy;  Laterality: N/A;   COLONOSCOPY N/A 07/22/2018   Procedure: COLONOSCOPY;  Surgeon: Pasty Spillers, MD;  Location: [facility] ENDOSCOPY;  Service: Endoscopy;  Laterality: N/A;   COLONOSCOPY WITH PROPOFOL N/A 09/21/2021   Procedure: COLONOSCOPY WITH PROPOFOL;  Surgeon: Sung Amabile, DO;  Location: [facility] ENDOSCOPY;  Service: General;  Laterality: N/A;   DILATION AND CURETTAGE OF UTERUS     ELECTROMAGNETIC NAVIGATION BROCHOSCOPY Right 04/11/2016   Procedure: ELECTROMAGNETIC NAVIGATION BRONCHOSCOPY;  Surgeon: Stephanie Acre, MD;  Location: [facility] ORS;  Service: Cardiopulmonary;  Laterality: Right;   ESOPHAGOGASTRODUODENOSCOPY N/A 07/22/2018   Procedure: ESOPHAGOGASTRODUODENOSCOPY (EGD);  Surgeon: Pasty Spillers, MD;  Location: [facility] ENDOSCOPY;  Service: Endoscopy;  Laterality: N/A;   ESOPHAGOGASTRODUODENOSCOPY (EGD) WITH PROPOFOL N/A 05/07/2018   Procedure: ESOPHAGOGASTRODUODENOSCOPY (EGD) WITH PROPOFOL;  Surgeon: Midge Minium, MD;  Location:  [facility] ENDOSCOPY;  Service: Endoscopy;  Laterality: N/A;   ESOPHAGOGASTRODUODENOSCOPY (EGD) WITH PROPOFOL N/A 04/24/2019   Procedure: ESOPHAGOGASTRODUODENOSCOPY (EGD) WITH PROPOFOL;  Surgeon: Wyline Mood, MD;  Location[facility] END[facility]COPY;  Service: Gastroenterology;  Laterality: N/A;   ESOPHAGOGASTRODUODENOSCOPY (EGD) WITH PROPOFOL N/A 01/12/2020   Procedure: ESOPHAGOGASTRODUODENOSCOPY (EGD) WITH PROPOFOL;  Surgeon: Wyline Mood, MD;  Location: [facility] ENDOSCOPY;  Service: Gastroenterology;  Laterality: N/A;   ESOPHAGOGASTRODUODENOSCOPY (EGD) WITH PROPOFOL N/A 04/28/2020   Procedure: ESOPHAGOGASTRODUODENOSCOPY (EGD) WITH PROPOFOL;  Surgeon: Wyline Mood, MD;  Location: [facility] ENDOSCOPY;  Service: Gastroenterology;  Laterality: N/A;   EUS N/A 05/07/2019   Procedure: FULL UPPER ENDOSCOPIC ULTRASOUND (EUS) RADIAL;  Surgeon: Rayann Heman, MD;  Location: [facility] ENDOSCOPY;  Service: Endoscopy;  Laterality: N/A;   ILEOSCOPY N/A 07/22/2018   Procedure: ILEOSCOPY THROUGH STOMA;  Surgeon: Pasty Spillers, MD;  Location: [facility] ENDOSCOPY;  Service: Endoscopy;  Laterality: N/A;   ILEOSTOMY     ILEOSTOMY N/A 09/08/2018   Procedure: ILEOSTOMY REVISION POSSIBLE CREATION;  Surgeon: Carolan Shiver, MD;  Location: [facility] ORS;  Service: General;  Laterality: N/A;   ILEOSTOMY CLOSURE N/A 08/15/2018   Procedure: DILATION OF ILEOSTOMY STRICTURE;  Surgeon: Carolan Shiver, MD;  Location: [facility] ORS;  Service: General;  Laterality: N/A;   LAPAROTOMY Right 05/04/2018  Procedure: EXPLORATORY LAPAROTOMY right colectomy right and left ostomy;  Surgeon: Carolan Shiver, MD;  Location: [facility] ORS;  Service: General;  Laterality: Right;   LEFT ATRIAL APPENDAGE OCCLUSION N/A 01/11/2021   Procedure: LEFT ATRIAL APPENDAGE OCCLUSION (WATCHMAN's DEVICE); ABORTED PROCEDURE WITHOUT DEVICE BEING IMPLANTED; Location: [facility]; Surgeon: Gerre Pebbles, MD   LUNG BIOPSY     MASTECTOMY Left    2000, [facility]   ROTATOR CUFF REPAIR Right     [facility]   XI ROBOTIC ASSISTED COLOSTOMY TAKEDOWN N/A 10/23/2021   Procedure: XI ROBOTIC ASSISTED ILEOSTOMY TAKEDOWN;  Surgeon: Carolan Shiver, MD;  Location: [facility] ORS;  Service: General;  Laterality: N/A;  180 minutes for the surgery part please    (Not in a hospital admission)  Social History   Socioeconomic History   Marital status: Divorced    Spouse name: Not on file   Number of children: 3   Years of education: Not on file   Highest education level: Not on file  Occupational History   Occupation: Retired   Tobacco Use   Smoking status: Former    Packs/day: 0.50    Years: 20.00    Pack years: 10.00    Types: Cigarettes    Quit date: 07/02/2012    Years since quitting: 9.6   Smokeless tobacco: Current    Types: Snuff   Tobacco comments:    quit 2014  Vaping Use   Vaping Use: Never used  Substance and Sexual Activity   Alcohol use: Yes    Comment: Occasionally beer   Drug use: No   Sexual activity: Not Currently  Other Topics Concern   Not on file  Social History Narrative   Not on file   Social Determinants of Health   Financial Resource Strain: Not on file  Food Insecurity: Not on file  Transportation Needs: Not on file  Physical Activity: Not on file  Stress: Not on file  Social Connections: Not on file  Intimate Partner Violence: Not on file    Family History  Problem Relation Age of Onset   Breast cancer Mother 15   Cancer Mother        Breast    Cirrhosis Father    Breast cancer Paternal Aunt 106   Cancer Maternal Aunt        Breast       PHYSICAL EXAM General: Pleasant thin black female, well nourished, in no acute distress.  Lying nearly flat in hospital bed, requesting coffee  HEENT:  Normocephalic and atraumatic. Neck:  No JVD.  Lungs: Normal respiratory effort on oxygen by nasal cannula.  Decreased breath sounds with trace crackles bilaterally.   Heart: Irregularly irregular rhythm with controlled rate. Normal S1 and S2 without gallops.   3/6 systolic murmur best heard at the RUSB and apex. Radial & DP pulses 1+ bilaterally. Abdomen: Non-distended appearing.  Msk: Normal strength and tone for age. Extremities: Warm and well perfused. No clubbing, cyanosis. No peripehral edema.  Neuro: Alert and oriented X 3. Psych:  Answers questions appropriately.   Labs:   Lab Results  Component Value Date   WBC 4.9 02/22/2022   HGB 9.4 (L) 02/22/2022   HCT 29.5 (L) 02/22/2022   MCV 94.6 02/22/2022   PLT 190 02/22/2022    Recent Labs  Lab 02/22/22 0610  NA 134*  K 3.8  CL 101  CO2 26  BUN 38*  CREATININE 1.92*  CALCIUM 9.0  PROT 6.8  BILITOT 0.9  ALKPHOS 55  ALT 12  AST 20  GLUCOSE 78   Lab Results  Component Value Date   CKTOTAL 207 06/26/2013   CKMB 1.9 06/27/2013   TROPONINI <0.03 12/24/2018    Lab Results  Component Value Date   CHOL 152 01/21/2020   Lab Results  Component Value Date   HDL 87 01/21/2020   Lab Results  Component Value Date   LDLCALC 53 01/21/2020   Lab Results  Component Value Date   TRIG 95 11/06/2021   TRIG 57 11/02/2021   TRIG 60 01/21/2020   Lab Results  Component Value Date   CHOLHDL 1.7 01/21/2020   No results found for: LDLDIRECT    Radiology: DG Chest 1 View  Result Date: 01/26/2022 CLINICAL DATA:  Chest pain and tachycardia. EXAM: CHEST  1 VIEW COMPARISON:  11/22/2021 FINDINGS: Heart size is normal. Aortic atherosclerosis. Diffuse pulmonary vascular congestion. Increased volume of right pleural effusion with veil like opacification over the right lower lobe is noted. Post treatment changes within the perihilar right upper lobe appears stable in the interval. IMPRESSION: 1. Increased volume of right pleural effusion with veil like opacification over the right lower lobe. 2. Pulmonary vascular congestion. Electronically Signed   By: Signa Kell M.D.   On: 01/26/2022 13:31   DG Chest 2 View  Result Date: 02/01/2022 CLINICAL DATA:  Shortness of breath. EXAM: CHEST - 2  VIEW COMPARISON:  01/28/2022 FINDINGS: Stable mild cardiomegaly. Aortic atherosclerotic calcification noted. Right upper lobe scarring remains stable. Bibasilar interstitial prominence and Kerley B-lines are increased, consistent with mild interstitial edema. Small right pleural effusion is also noted. No evidence of pulmonary consolidation. IMPRESSION: Mild congestive heart failure, with small right pleural effusion. Electronically Signed   By: Danae Orleans M.D.   On: 02/01/2022 11:20   CT Chest Wo Contrast  Result Date: 02/21/2022 CLINICAL DATA:  Chest wall pain, nontraumatic, infection or inflammation suspected, xray done Patient with history of multiple cancers. Patient has increasing pain right chest EXAM: CT CHEST WITHOUT CONTRAST TECHNIQUE: Multidetector CT imaging of the chest was performed following the standard protocol without IV contrast. RADIATION DOSE REDUCTION: This exam was performed according to the departmental dose-optimization program which includes automated exposure control, adjustment of the mA and/or kV according to patient size and/or use of iterative reconstruction technique. COMPARISON:  Radiograph earlier today. Chest CT 01/26/2022, additional prior chest CT reviewed FINDINGS: Cardiovascular: Moderately advanced aortic atherosclerosis. No aortic aneurysm. Mild cardiomegaly. There are coronary artery calcifications. Mitral annulus calcifications. Dilated central pulmonary arteries, main pulmonary artery measures 3.6 cm, right pulmonary artery measures 3.1 cm, left pulmonary artery measures 3.2 cm. There is no pericardial effusion. Mediastinum/Nodes: Decreased subcarinal node currently measuring 14 mm, previously 16 mm, series 3, image 70. Additional scattered small mediastinal lymph nodes are not enlarged by size criteria. Lack of IV contrast significantly limits assessment for hilar adenopathy. Small hiatal hernia with patulous esophagus. Lungs/Pleura: Moderate emphysema. Layering  right pleural effusion with associated atelectasis, not significantly changed in appearance from prior CT. Irregular nodular opacity in the right upper lobe measuring 2.4 x 1.5 cm, series 4, image 59, not significantly changed on my retrospective measurement. Masslike opacity in the anterior right upper lobe measures 3.7 x 2 cm, series 4, image 69, possibly smaller versus stable allowing for differences in caliper placement in slice selection. Calcified left apical pleuroparenchymal thickening is stable in related scarring. There is no acute airspace disease or new pulmonary nodule. No left pleural effusion. Stable areas of subpleural thickening in the anterior  left lung. Upper Abdomen: Stable 11 mm water density lesion in the left lobe of the liver, not metabolic in prior PET and consistent with cyst. Dense upper abdominal aortic atherosclerosis. No acute upper abdominal findings. Musculoskeletal: Stable concavity of L2 superior endplate. No focal rib lesion or rib fracture to explain right-sided pain. There are remote left anterior rib fractures. Left mastectomy. Chest wall soft tissue abnormalities. IMPRESSION: 1. Layering right pleural effusion with associated atelectasis, not significantly changed in appearance from CT last month. 2. Two areas of masslike opacity in the right upper lobe. 1 of these areas appears stable, the other may be slightly smaller or unchanged, regardless there is no progression. These may represent areas of post radiation scarring. Continued oncologic follow-up is recommended. 3. No new or acute airspace disease. 4. Decreased size of subcarinal node from prior exam. 5. Cardiomegaly with aortic atherosclerosis. Coronary artery calcifications. Aortic Atherosclerosis (ICD10-I70.0) and Emphysema (ICD10-J43.9). Electronically Signed   By: Narda Rutherford M.D.   On: 02/21/2022 18:04   CT Chest Wo Contrast  Result Date: 01/26/2022 CLINICAL DATA:  Shortness of breath and chest pain since  Monday, history breast cancer, lung cancer, COPD, COVID-19, hypertension, former smoker EXAM: CT CHEST WITHOUT CONTRAST TECHNIQUE: Multidetector CT imaging of the chest was performed following the standard protocol without IV contrast. RADIATION DOSE REDUCTION: This exam was performed according to the departmental dose-optimization program which includes automated exposure control, adjustment of the mA and/or kV according to patient size and/or use of iterative reconstruction technique. COMPARISON:  11/22/2021 FINDINGS: Cardiovascular: Atherosclerotic calcifications aorta, proximal great vessels, and coronary arteries. Aorta normal caliber. Enlargement of cardiac chambers. No pericardial effusion. Mediastinum/Nodes: Esophagus unremarkable. Base of cervical region normal appearance. Enlarged subcarinal lymph node 16 mm short axis previously 11 mm. No additional adenopathy. Post LEFT mastectomy. Lungs/Pleura: Scarring and calcification at anterior LEFT lung apex unchanged. Emphysematous changes. Irregular density again identified in RIGHT upper lobe 2.8 x 2.0 cm previously 3.4 x 2.2 cm, reportedly site of prior radiation therapy. An additional irregular mass like area of opacity is seen in the anterior RIGHT upper lobe 5.0 x 1.9 cm image 67 previously 4.3 x 2.1 cm, little changed. Central peribronchial thickening. Moderate-sized RIGHT pleural effusion with compressive atelectasis of RIGHT lower lobe. Pleural thickening anterior LEFT upper chest question related to prior radiation therapy. No acute pulmonary infiltrate, pneumothorax or additional mass. Upper Abdomen: Extensive atherosclerotic calcifications in the upper abdomen involving SMA, aorta, and origin of celiac artery as well as BILATERAL renal arteries. Nonspecific low-attenuation focus within LEFT lobe liver 10 mm diameter image 140 unchanged, corresponding to cyst on prior CT. No additional upper abdominal findings. Musculoskeletal: Osseous  demineralization. Superior endplate concavity L2 unchanged. IMPRESSION: Moderate-sized RIGHT pleural effusion with compressive atelectasis of RIGHT lower lobe. Stable appearance of irregular opacities in RIGHT upper lobe, slightly smaller at this more superior area and slightly larger at the more inferior and anterior area, patient reportedly with prior radiation therapy to this region; consider PET-CT assessment to exclude returned tumor. Slight interval increase in size of a subcarinal lymph node, nonspecific. Extensive atherosclerotic disease changes including coronary arteries. Aortic Atherosclerosis (ICD10-I70.0) and Emphysema (ICD10-J43.9). Electronically Signed   By: Ulyses Southward M.D.   On: 01/26/2022 15:50   US Carotid Bilateral  Result Date: 01/30/2022 CLINICAL DATA:  66 year old female with a history of stroke EXAM: BILATERAL CAROTID DUPLEX ULTRASOUND TECHNIQUE: Wallace Cullens scale imaging, color Doppler and duplex ultrasound were performed of bilateral carotid and vertebral arteries in the neck.  COMPARISON:  None. FINDINGS: Criteria: Quantification of carotid stenosis is based on velocity parameters that correlate the residual internal carotid diameter with NASCET-based stenosis levels, using the diameter of the distal internal carotid lumen as the denominator for stenosis measurement. The following velocity measurements were obtained: RIGHT ICA:  Systolic 77 cm/sec, Diastolic 30 cm/sec CCA:  29 cm/sec SYSTOLIC ICA/CCA RATIO:  2.6 ECA:  27 cm/sec LEFT ICA:  Systolic 56 cm/sec, Diastolic 23 cm/sec CCA:  29 cm/sec SYSTOLIC ICA/CCA RATIO:  2.0 ECA:  28 cm/sec Right Brachial SBP: Not acquired Left Brachial SBP: Not acquired RIGHT CAROTID ARTERY: No significant calcifications of the right common carotid artery. Intermediate waveform maintained. Moderate heterogeneous and partially calcified plaque at the right carotid bifurcation. No significant lumen shadowing. Low resistance waveform of the right ICA. No  significant tortuosity. RIGHT VERTEBRAL ARTERY: Antegrade flow with low resistance waveform. LEFT CAROTID ARTERY: No significant calcifications of the left common carotid artery. Intermediate waveform maintained. Moderate heterogeneous and partially calcified plaque at the left carotid bifurcation. No significant lumen shadowing. Low resistance waveform of the left ICA. No significant tortuosity. LEFT VERTEBRAL ARTERY:  Antegrade flow with low resistance waveform. IMPRESSION: Color duplex indicates moderate heterogeneous and calcified plaque, with no hemodynamically significant stenosis by duplex criteria in the extracranial cerebrovascular circulation. Signed, Yvone Neu. Reyne Dumas, RPVI [facility] Radiology Electronically Signed   By: Gilmer Mor D.O.   On: 01/30/2022 10:46   DG Chest Portable 1 View  Result Date: 02/21/2022 CLINICAL DATA:  Short of breath EXAM: PORTABLE CHEST 1 VIEW COMPARISON:  02/01/2022 FINDINGS: Progression of right lower lobe airspace disease and small right effusion. Right perihilar density with mild progression. Heart size upper normal. Negative for heart failure or edema. Atherosclerotic calcification aortic arch. Left lung clear. IMPRESSION: Progressive right lower lobe airspace disease and right pleural effusion. Progressive density overlying the right hilum. Possible pneumonia. Electronically Signed   By: Marlan Palau M.D.   On: 02/21/2022 17:03   DG Chest Port 1 View  Result Date: 01/28/2022 CLINICAL DATA:  Status post right thoracentesis. EXAM: PORTABLE CHEST 1 VIEW COMPARISON:  01/26/2022 and older studies. FINDINGS: Significant decrease in right pleural fluid following thoracentesis. No pneumothorax. Elongated opacity in the right upper lobe is stable. Opacity in the medial left upper lobe is stable. Prominent interstitial markings bilaterally are also unchanged. No new lung abnormalities. IMPRESSION: 1. Significant  decrease in right pleural fluid following thoracentesis. No pneumothorax. No other change. Electronically Signed   By: Amie Portland M.D.   On: 01/28/2022 10:23   ECHOCARDIOGRAM COMPLETE  Result Date: 01/30/2022    ECHOCARDIOGRAM REPORT   Patient Name:   Danielle [name] [facility] Date of Exam: 01/30/2022 Medical Rec #:  [id number]       Height:       66.0 in Accession #:    [id number]      Weight:       118.4 lb Date of Birth:  Jun 02, 1956       BSA:          1.600 m Patient Age:    65 years        BP:           95/66 mmHg Patient Gender: F               HR:           116 bpm. Exam Location:  [facility] Procedure: 2D Echo, Cardiac Doppler and Color Doppler Indications:  Stroke I63.9  History:         Patient has prior history of Echocardiogram examinations, most                  recent 10/27/2021. COPD, Signs/Symptoms:Murmur; Risk                  Factors:Hypertension. Aortic valve stenosis.  Sonographer:     Cristela Blue Referring Phys:  [id number] VIPUL [facility] Diagnosing Phys: Alwyn Pea MD IMPRESSIONS  1. Ant/apical/setal hpo.  2. Left ventricular ejection fraction, by estimation, is 35 to 40%. The left ventricle has moderately decreased function. The left ventricle demonstrates regional wall motion abnormalities (see scoring diagram/findings for description). The left ventricular internal cavity size was mildly dilated. Left ventricular diastolic parameters are consistent with Grade II diastolic dysfunction (pseudonormalization).  3. Right ventricular systolic function is low normal. The right ventricular size is normal.  4. The mitral valve is normal in structure. Moderate mitral valve regurgitation.  5. Tricuspid valve regurgitation is moderate to severe.  6. The aortic valve is normal in structure. Aortic valve regurgitation is not visualized. Moderate aortic valve stenosis. FINDINGS  Left Ventricle: Left ventricular ejection fraction, by estimation, is 35 to 40%. The left ventricle has moderately decreased function. The  left ventricle demonstrates regional wall motion abnormalities. The left ventricular internal cavity size was mildly dilated. There is borderline concentric left ventricular hypertrophy. Left ventricular diastolic parameters are consistent with Grade II diastolic dysfunction (pseudonormalization). Right Ventricle: The right ventricular size is normal. No increase in right ventricular wall thickness. Right ventricular systolic function is low normal. Left Atrium: Left atrial size was normal in size. Right Atrium: Right atrial size was not assessed. Pericardium: There is no evidence of pericardial effusion. Mitral Valve: The mitral valve is normal in structure. Moderate mitral valve regurgitation. Tricuspid Valve: The tricuspid valve is grossly normal. Tricuspid valve regurgitation is moderate to severe. Aortic Valve: The aortic valve is normal in structure. Aortic valve regurgitation is not visualized. Moderate aortic stenosis is present. Aortic valve mean gradient measures 6.3 mmHg. Aortic valve peak gradient measures 11.6 mmHg. Aortic valve area, by VTI measures 0.80 cm. Pulmonic Valve: The pulmonic valve was normal in structure. Pulmonic valve regurgitation is trivial. Aorta: The ascending aorta was not well visualized. IAS/Shunts: No atrial level shunt detected by color flow Doppler. Additional Comments: Ant/apical/setal hpo.  LEFT VENTRICLE PLAX 2D LVIDd:         3.90 cm LVIDs:         3.00 cm LV PW:         1.30 cm LV IVS:        0.85 cm LVOT diam:     2.00 cm LV SV:         20 LV SV Index:   12 LVOT Area:     3.14 cm  LV Volumes (MOD) LV vol d, MOD A2C: 35.6 ml LV vol d, MOD A4C: 62.7 ml LV vol s, MOD A2C: 23.2 ml LV vol s, MOD A4C: 42.8 ml LV SV MOD A2C:     12.4 ml LV SV MOD A4C:     62.7 ml LV SV MOD BP:      18.0 ml RIGHT VENTRICLE RV Basal diam:  3.70 cm RV S prime:     10.20 cm/s TAPSE (M-mode): 1.1 cm LEFT ATRIUM           Index        RIGHT ATRIUM  Index LA diam:      3.20 cm 2.00 cm/m   RA  Area:     21.70 cm LA Vol (A2C): 48.6 ml 30.37 ml/m  RA Volume:   71.60 ml  44.74 ml/m LA Vol (A4C): 15.7 ml 9.81 ml/m  AORTIC VALVE                     PULMONIC VALVE AV Area (Vmax):    0.81 cm      PV Vmax:        0.39 m/s AV Area (Vmean):   0.84 cm      PV Vmean:       27.500 cm/s AV Area (VTI):     0.80 cm      PV VTI:         0.051 m AV Vmax:           170.00 cm/s   PV Peak grad:   0.6 mmHg AV Vmean:          114.667 cm/s  PV Mean grad:   0.0 mmHg AV VTI:            0.247 m       RVOT Peak grad: 2 mmHg AV Peak Grad:      11.6 mmHg AV Mean Grad:      6.3 mmHg LVOT Vmax:         43.70 cm/s LVOT Vmean:        30.800 cm/s LVOT VTI:          0.063 m LVOT/AV VTI ratio: 0.25  AORTA Ao Root diam: 3.00 cm MITRAL VALVE               TRICUSPID VALVE MV Area (PHT): 8.34 cm    TR Peak grad:   42.5 mmHg MV Decel Time: 91 msec     TR Vmax:        326.00 cm/s MV E velocity: 70.30 cm/s MV A velocity: 57.00 cm/s  SHUNTS MV E/A ratio:  1.23        Systemic VTI:  0.06 m                            Systemic Diam: 2.00 cm                            Pulmonic VTI:  0.086 m Alwyn Pea MD Electronically signed by Alwyn Pea MD Signature Date/Time: 01/30/2022/1:54:44 PM    Final    CT HEAD CODE STROKE WO CONTRAST`  Result Date: 01/29/2022 CLINICAL DATA:  Code stroke.  Neuro deficit, acute, stroke suspected EXAM: CT HEAD WITHOUT CONTRAST TECHNIQUE: Contiguous axial images were obtained from the base of the skull through the vertex without intravenous contrast. RADIATION DOSE REDUCTION: This exam was performed according to the departmental dose-optimization program which includes automated exposure control, adjustment of the mA and/or kV according to patient size and/or use of iterative reconstruction technique. COMPARISON:  11/16/2020 FINDINGS: Brain: There is no acute intracranial hemorrhage, mass effect, or edema. Gray-white differentiation is preserved. Patchy hypoattenuation in the supratentorial white matter  is nonspecific probably reflects stable mild chronic microvascular ischemic changes. Ventricles and sulci are within normal limits in size and configuration. No extra-axial collection. Vascular: No hyperdense vessel. There is intracranial atherosclerotic calcification at the skull base. Skull: Unremarkable. Sinuses/Orbits: No acute finding. Other: Mastoid air cells  are clear. ASPECTS (Alberta Stroke Program Early CT Score) - Ganglionic level infarction (caudate, lentiform nuclei, internal capsule, insula, M1-M3 cortex): 7 - Supraganglionic infarction (M4-M6 cortex): 3 Total score (0-10 with 10 being normal): 10 IMPRESSION: There is no acute intracranial hemorrhage or evidence of acute infarction. ASPECT score is 10. Electronically Signed   By: Guadlupe Spanish M.D.   On: 01/29/2022 07:20   US THORACENTESIS ASP PLEURAL SPACE W/IMG GUIDE  Result Date: 01/28/2022 INDICATION: History of COPD on chronic O2, CKD, AFib and lung cancer with recurrent right pleural effusion. Request received for diagnostic and therapeutic right thoracentesis. EXAM: ULTRASOUND GUIDED DIAGNOSTIC AND THERAPEUTIC RIGHT THORACENTESIS MEDICATIONS: 10 mL 1 % lidocaine COMPLICATIONS: None immediate. PROCEDURE: An ultrasound guided thoracentesis was thoroughly discussed with the patient and questions answered. The benefits, risks, alternatives and complications were also discussed. The patient understands and wishes to proceed with the procedure. Written consent was obtained. Ultrasound was performed to localize and mark an adequate pocket of fluid in the right chest. The area was then prepped and draped in the normal sterile fashion. 1% Lidocaine was used for local anesthesia. Under ultrasound guidance a 6 Fr Safe-T-Centesis catheter was introduced. Thoracentesis was performed. The catheter was removed and a dressing applied. FINDINGS: A total of approximately 900 mL of hazy, amber fluid was removed. Samples were sent to the laboratory as  requested by the clinical team. IMPRESSION: Successful ultrasound guided right thoracentesis yielding 900 mL of pleural fluid. Read by: Alex Gardener, AGNP-BC Electronically Signed   By: Judie Petit.  Shick M.D.   On: 01/28/2022 13:45    ECHO LVEF 35-40%, g2dd, mod-sev TR, mod AS, mod MS 01/2022  TELEMETRY reviewed by me: Atrial fibrillation with rate mostly between 60-80, some periods of erroneously captured bradycardia which correlate to actual heart rate in the 70s.  EKG reviewed by me: atrial flutter with rate 113  ASSESSMENT AND PLAN:  Danielle [name] is a 66 year old female with a past medical history of advanced COPD on chronic 2L, paroxysmal atrial fibrillation/flutter on dose reduced eliquis, HFrEF (LVEF 35-40%, g2dd, mod-sev TR, mod AS, mod MS 01/2022), CKD (baseline Cr. 2-2.5), remote history of lung cancer s/p radiation, esophageal cancer s/p radiation, MGUS, who presented to [facility] ED on 02/21/2022 from her nephrologists office with progressively worsening dyspnea on exertion since discharge from her last admission on 02/04/22. Cardiology is consulted for assistance with her atrial flutter with RVR.   #Atrial flutter with history of paroxysmal atrial fibrillation Patient presents with a 2-week history of progressively worsening dyspnea on exertion and associated palpitations.  She says her heart races and becomes short of breath with very minimal exertion.  She has had 3 hospital admissions since January and her heart rate/arrhythmia has become an increasing issue for her.  This admission she was in atrial flutter with heart rate by EKG in the 110s.  She denies prior history of electrical cardioversion and admits to compliance with her Eliquis 2.5 mg twice daily since discharge on 4/23. -S/p diltiazem gtt. with control of her heart rate, prefer not restarting this with her reduced EF -Continue amiodarone 200 mg twice daily -Continue metoprolol tartrate 25 mg twice daily -Continue Eliquis 2.5 mg twice daily,  dose reduced with her CKD -Plan for electrical cardioversion tomorrow morning with Dr. Gwen Pounds for hopeful conversion to sinus rhythm.  Procedure discussed in detail with patient including risk, benefits, alternatives and she is agreeable to proceed.  Will make patient n.p.o. after midnight.  #Acute on chronic HFrEF (LVEF  35-40%) BNP on admission elevated to 1000, which still elevated but less than it was on her last admission (1500).  She has an oxygen requirement above her baseline (reportedly uses supplemental oxygen intermittently at home) some trace crackles bilaterally and imaging shows a right pleural effusion, but does not appear grossly volume overloaded. -Continue home torsemide 40 mg for now, consider further diuresis later in hospital course. -Continue metoprolol as above, GDMT is somewhat limited by relatively low blood pressures.  #CKD 4 Renal function currently around baseline with creatinine 1.92, EGFR 28.  This patient's plan of care was discussed and created with Dr. Gwen Pounds and he is in agreement.  Signed: Rebeca Allegra , PA-C 02/22/2022, 2:24 PM [facility]  Patient has understood all risks and benefits on nsr and cardioversion. With overall plans per above    The patient has been interviewed and examined. I agree with assessment and plan above. Arnoldo Hooker MD [facility]

## 2022-02-23 ENCOUNTER — Inpatient Hospital Stay: Payer: Medicare Other | Admitting: Anesthesiology

## 2022-02-23 ENCOUNTER — Encounter: Payer: Self-pay | Admitting: Internal Medicine

## 2022-02-23 ENCOUNTER — Encounter
Admission: EM | Disposition: A | Discharge status: Home / Self Care | Payer: Self-pay | Source: Home / Self Care | Attending: Internal Medicine

## 2022-02-23 ENCOUNTER — Inpatient Hospital Stay: Payer: Medicare Other

## 2022-02-23 DIAGNOSIS — I4891 Unspecified atrial fibrillation: Secondary | ICD-10-CM

## 2022-02-23 DIAGNOSIS — J90 Pleural effusion, not elsewhere classified: Secondary | ICD-10-CM

## 2022-02-23 HISTORY — PX: CARDIOVERSION: SHX1299

## 2022-02-23 LAB — BODY FLUID CELL COUNT WITH DIFFERENTIAL
Eos, Fluid: 0 %
Lymphs, Fluid: 63 %
Monocyte-Macrophage-Serous Fluid: 23 %
Neutrophil Count, Fluid: 14 %
Total Nucleated Cell Count, Fluid: 1178 cu mm

## 2022-02-23 LAB — BASIC METABOLIC PANEL
Anion gap: 8 (ref 5–15)
BUN: 46 mg/dL — ABNORMAL HIGH (ref 8–23)
CO2: 26 mmol/L (ref 22–32)
Calcium: 9.1 mg/dL (ref 8.9–10.3)
Chloride: 103 mmol/L (ref 98–111)
Creatinine, Ser: 2.2 mg/dL — ABNORMAL HIGH (ref 0.44–1.00)
GFR, Estimated: 24 mL/min — ABNORMAL LOW (ref >60)
Glucose, Bld: 93 mg/dL (ref 70–99)
Potassium: 3.7 mmol/L (ref 3.5–5.1)
Sodium: 137 mmol/L (ref 135–145)

## 2022-02-23 LAB — PATHOLOGIST SMEAR REVIEW

## 2022-02-23 LAB — MAGNESIUM: Magnesium: 1.7 mg/dL (ref 1.7–2.4)

## 2022-02-23 SURGERY — CARDIOVERSION
Anesthesia: General

## 2022-02-23 MED ORDER — FUROSEMIDE 10 MG/ML IJ SOLN
20.0000 mg | Freq: Once | INTRAMUSCULAR | Status: AC
  Administered 2022-02-23: 20 mg via INTRAVENOUS
  Filled 2022-02-23: qty 2

## 2022-02-23 MED ORDER — PROPOFOL 10 MG/ML IV BOLUS
INTRAVENOUS | Status: DC | PRN
  Administered 2022-02-23: 40 mg via INTRAVENOUS

## 2022-02-23 MED ORDER — PROPOFOL 10 MG/ML IV BOLUS
INTRAVENOUS | Status: AC
  Filled 2022-02-23: qty 20

## 2022-02-23 MED ORDER — MAGNESIUM SULFATE 2 GM/50ML IV SOLN
2.0000 g | Freq: Once | INTRAVENOUS | Status: AC
  Administered 2022-02-23: 2 g via INTRAVENOUS
  Filled 2022-02-23: qty 50

## 2022-02-23 MED ORDER — PHENYLEPHRINE HCL (PRESSORS) 10 MG/ML IV SOLN
INTRAVENOUS | Status: DC | PRN
  Administered 2022-02-23: 80 ug via INTRAVENOUS

## 2022-02-23 MED ORDER — SODIUM CHLORIDE 0.9 % IV SOLN: INTRAVENOUS | Status: DC

## 2022-02-23 NOTE — Anesthesia Postprocedure Evaluation (Signed)
Anesthesia Post Note ? ?Patient: Danielle [name] ? ?Procedure(s) Performed: CARDIOVERSION ? ?Patient location during evaluation: Specials Recovery ?Anesthesia Type: General ?Level of consciousness: awake and alert ?Pain management: pain level controlled ?Vital Signs Assessment: post-procedure vital signs reviewed and stable ?Respiratory status: spontaneous breathing, nonlabored ventilation, respiratory function stable and patient connected to nasal cannula oxygen ?Cardiovascular status: blood pressure returned to baseline and stable ?Postop Assessment: no apparent nausea or vomiting ?Anesthetic complications: no ? ? ?No notable events documented. ? ? ?Last Vitals:  ?Vitals:  ? 02/23/22 1051 02/23/22 1118  ?BP: 126/79 128/85  ?Pulse: 67 66  ?Resp: (!) 21 19  ?Temp:  36.7 ?C  ?SpO2: 100% 100%  ?  ?Last Pain:  ?Vitals:  ? 02/23/22 1118  ?TempSrc: Oral  ?PainSc:   ? ? ?  ?  ?  ?  ?  ?  ? ?Precious Haws Arrin Pintor ? ? ? ? ?

## 2022-02-23 NOTE — Transfer of Care (Signed)
Immediate Anesthesia Transfer of Care Note ? ?Patient: Danielle [name] ? ?Procedure(s) Performed: CARDIOVERSION ? ?Patient Location: PACU and special recoveries ? ?Anesthesia Type:General ? ?Level of Consciousness: drowsy and patient cooperative ? ?Airway & Oxygen Therapy: Patient Spontanous Breathing and Patient connected to nasal cannula oxygen ? ?Post-op Assessment: Report given to RN and Post -op Vital signs reviewed and stable ? ?Post vital signs: Reviewed and stable ? ?Last Vitals:  ?Vitals Value Taken Time  ?BP 132/80 02/23/22 1015  ?Temp    ?Pulse 66 02/23/22 1016  ?Resp 25 02/23/22 1016  ?SpO2 98 % 02/23/22 1016  ? ? ?Last Pain:  ?Vitals:  ? 02/23/22 0958  ?TempSrc:   ?PainSc: 7   ?   ? ?Patients Stated Pain Goal: 5 (02/23/22 5670) ? ?Complications: No notable events documented. ?

## 2022-02-23 NOTE — Progress Notes (Signed)
PT Cancellation Note ? ?Patient Details ?Name: Danielle [name] ?MRN: [id number] ?DOB: [birth year] ? ? ?Cancelled Treatment:     Pt attempt. Pt off floor. PT will continue to follow and progress as able per current POC.  ? ? ?Willette Pa ?02/23/2022, 3:27 PM ?

## 2022-02-23 NOTE — Procedures (Signed)
PROCEDURE SUMMARY: ? ?Successful US guided right thoracentesis. ?Yielded 650 ml of clear yellow fluid. ?Pt tolerated procedure well. ?No immediate complications. ? ?Specimen not sent for labs. ?CXR ordered; no post-procedure pneumothorax identified ? ?EBL < 2 mL ? ?Theresa Duty, NP ?02/23/2022 ?4:05 PM ? ? ? ?

## 2022-02-23 NOTE — CV Procedure (Signed)
Electrical Cardioversion Procedure Note ?Danielle [name] ?[id number] ?02-23-1956 ? ?Procedure: Electrical Cardioversion ?Indications:  Paroxysmal non valvular atrial fibrillation ? ?Procedure Details ?Consent: Risks of procedure as well as the alternatives and risks of each were explained to the (patient/caregiver).  Consent for procedure obtained. ?Time Out: Verified patient identification, verified procedure, site/side was marked, verified correct patient position, special equipment/implants available, medications/allergies/relevent history reviewed, required imaging and test results available.  Performed ? ?Patient placed on cardiac monitor, pulse oximetry, supplemental oxygen as necessary.  ?Sedation given: Propofol and versed as per anesthesia  ?Pacer pads placed anterior and posterior chest. ? ?Cardioverted 1 time(s).  ?Cardioverted at 120J. ? ?Evaluation ?Findings: Post procedure EKG shows: NSR ?Complications: None ?Patient did tolerate procedure well. ? ? ?Serafina Royals M.D. FACC ?02/23/2022, 10:17 AM ? ? ? ?

## 2022-02-23 NOTE — Anesthesia Procedure Notes (Signed)
Procedure Name: [place] ?Date/Time: 02/23/2022 10:09 AM ?Performed by: Jerrye Noble, CRNA ?Pre-anesthesia Checklist: Patient identified, Emergency Drugs available, Suction available and Patient being monitored ?Patient Re-evaluated:Patient Re-evaluated prior to induction ?Oxygen Delivery Method: Nasal cannula ? ? ? ? ?

## 2022-02-23 NOTE — Progress Notes (Addendum)
?[place] CARDIOLOG[facility] CONSULT NOTE  ? ?    ?Patient ID: ?Danielle [name] ?MRN: [id number] ?DOB/AGE: 21-Oct-1955 66 y.o. ? ?Admit date: 02/21/2022 ?Referring Physician Dr. Gerlean Ren ?Primary Physician [facility] ?Primary Cardiologist Dr. [name] ?Reason for Consultation atrial flutter with RVR ? ?HPI: Danielle [name] is a 66 year old female with a past medical history of advanced COPD on as needed 2L, paroxysmal atrial fibrillation/flutter on dose reduced eliquis, HFrEF (LVEF 35-40%, g2dd, mod-sev TR, mod AS, mod MS 01/2022), CKD (baseline Cr. 2-2.5), remote history of lung cancer s/p radiation, esophageal cancer s/p radiation, MGUS, who presented to [facility] ED on 02/21/2022 from her nephrologists office with progressively worsening dyspnea on exertion since discharge from her last admission on 02/04/22. Cardiology is consulted for assistance with her atrial flutter with RVR.  ? ?Interval History: ?- feels okay this morning, still slightly dyspneic and and heart racing with ambulating short distance to the bedside commode, otherwise no complaints ?- cardioversion scheduled for 10am today ? ?Review of systems complete and found to be negative unless listed above  ? ? ? ?Past Medical History:  ?Diagnosis Date  ? Anemia   ? Anxiety   ? Aortic atherosclerosis ([place])   ? Aortic valve stenosis 02/10/2018  ? a.) TTE 02/10/2018: EF 55-60%: mild AS with MPG of 12 mmHg. b.) TTE 04/21/2018: EF 35-40%; mild AS with MPG 8 mmHg. c.) TTE 11/07/2019: EF 50-55%; mild AS with MPG 10 mmHg.  ? Arthritis   ? Atrial fibrillation ([place])   ? a.) CHA2DS2-VASc = 4 (age, sex, HTN, aortic plaque). b.) Rate/rhythm maintained on oral carvedilol; chronically anticoagulated on dose reduced apixaban. c.)  Attempted deployment of LAA occlusive device on 01/11/2021; parameters failed and procedure aborted.  ? Breast cancer, left ([place]) 2000  ? a.) T2N1M0; ER/PR (+) --> Tx'd with total mastectomy, LN resection, XRT, and chemotherapy  ?  Cancer of right lung ([place]) 07/30/2016  ? a.) adenocarcinoma; ALK, ROS1, PDL1, BRAF, EGFR all negative.  ? Chronic diastolic CHF (congestive heart failure) ([place]) 11/26/2021  ? CKD (chronic kidney disease), stage IV ([place])   ? COPD (chronic obstructive pulmonary disease) ([place])   ? Dependence on supplemental oxygen   ? Depression   ? Diastolic dysfunction 85/90/9311  ? a.) TTE 02/10/2017: EF 55-60%; G2DD. b.) TTE 04/21/2018: EF 35-40%; mild LA dilation; mod MV regurgitation. c.) TTE 11/07/2019: EF 50-55%; G1DD.  ? DOE (dyspnea on exertion)   ? GERD (gastroesophageal reflux disease)   ? Heart murmur   ? History of 2019 novel coronavirus disease (COVID-19) 10/14/2019  ? HLD (hyperlipidemia)   ? Hypertension   ? Long term current use of anticoagulant   ? a.) apixaban  ? Lymphedema   ? Personal history of chemotherapy   ? Personal history of radiation therapy   ? SBO (small bowel obstruction) ([place]) 11/05/2020  ? Vitamin D deficiency   ?  ?Past Surgical History:  ?Procedure Laterality Date  ? Breast Biospy Left   ? [place]  ? BREAST SURGERY    ? COLONOSCOPY N/A 04/30/2018  ? Procedure: COLONOSCOPY;  Surgeon: Virgel Manifold, MD;  Location: [facility] ENDOSCOPY;  Service: Endoscopy;  Laterality: N/A;  ? COLONOSCOPY N/A 07/22/2018  ? Procedure: COLONOSCOPY;  Surgeon: Virgel Manifold, MD;  Location: [facility] ENDOSCOPY;  Service: Endoscopy;  Laterality: N/A;  ? COLONOSCOPY WITH PROPOFOL N/A 09/21/2021  ? Procedure: COLONOSCOPY WITH PROPOFOL;  Surgeon: Benjamine Sprague, DO;  Location: [facility] ENDOSCOPY;  Service: General;  Laterality: N/A;  ?  DILATION AND CURETTAGE OF UTERUS    ? ELECTROMAGNETIC NAVIGATION BROCHOSCOPY Right 04/11/2016  ? Procedure: ELECTROMAGNETIC NAVIGATION BRONCHOSCOPY;  Surgeon: Vilinda Boehringer, MD;  Location: [facility] ORS;  Service: Cardiopulmonary;  Laterality: Right;  ? ESOPHAGOGASTRODUODENOSCOPY N/A 07/22/2018  ? Procedure: ESOPHAGOGASTRODUODENOSCOPY (EGD);  Surgeon: Virgel Manifold, MD;  Location: [facility] ENDOSCOPY;   Service: Endoscopy;  Laterality: N/A;  ? ESOPHAGOGASTRODUODENOSCOPY (EGD) WITH PROPOFOL N/A 05/07/2018  ? Procedure: ESOPHAGOGASTRODUODENOSCOPY (EGD) WITH PROPOFOL;  Surgeon: Lucilla Lame, MD;  Location: [facility] ENDOSCOPY;  Service: Endoscopy;  Laterality: N/A;  ? ESOPHAGOGASTRODUODENOSCOPY (EGD) WITH PROPOFOL N/A 04/24/2019  ? Procedure: ESOPHAGOGASTRODUODENOSCOPY (EGD) WITH PROPOFOL;  Surgeon: Jonathon Bellows, MD;  Location: [facility] ENDOSCOPY;  Service: Gastroenterology;  Laterality: N/A;  ? ESOPHAGOGASTRODUODENOSCOPY (EGD) WITH PROPOFOL N/A 01/12/2020  ? Procedure: ESOPHAGOGASTRODUODENOSCOPY (EGD) WITH PROPOFOL;  Surgeon: Jonathon Bellows, MD;  Location: [facility] ENDOSCOPY;  Service: Gastroenterology;  Laterality: N/A;  ? ESOPHAGOGASTRODUODENOSCOPY (EGD) WITH PROPOFOL N/A 04/28/2020  ? Procedure: ESOPHAGOGASTRODUODENOSCOPY (EGD) WITH PROPOFOL;  Surgeon: Jonathon Bellows, MD;  Location[facility] ENDOSCOPY;  Service: Gastroenterology;  Laterality: N/A;  ? EUS N/A 05/07/2019  ? Procedure: FULL UPPER ENDOSCOPIC ULTRASOUND (EUS) RADIAL;  Surgeon: Jola Schmidt, MD;  Location: [facility] ENDOSCOPY;  Service: Endoscopy;  Laterality: N/A;  ? ILEOSCOPY N/A 07/22/2018  ? Procedure: ILEOSCOPY THROUGH STOMA;  Surgeon: Virgel Manifold, MD;  Location: [facility] ENDOSCOPY;  Service: Endoscopy;  Laterality: N/A;  ? ILEOSTOMY    ? ILEOSTOMY N/A 09/08/2018  ? Procedure: ILEOSTOMY REVISION POSSIBLE CREATION;  Surgeon: Herbert Pun, MD;  Location: [facility] ORS;  Service: General;  Laterality: N/A;  ? ILEOSTOMY CLOSURE N/A 08/15/2018  ? Procedure: DILATION OF ILEOSTOMY STRICTURE;  Surgeon: Herbert Pun, MD;  Location: [facility] ORS;  Service: General;  Laterality: N/A;  ? LAPAROTOMY Right 05/04/2018  ? Procedure: EXPLORATORY LAPAROTOMY right colectomy right and left ostomy;  Surgeon: Herbert Pun, MD;  Location: [facility] ORS;  Service: General;  Laterality: Right;  ? LEFT ATRIAL APPENDAGE OCCLUSION N/A 01/11/2021  ? Procedure: LEFT ATRIAL APPENDAGE  OCCLUSION ([place]); ABORTED PROCEDURE WITHOUT DEVICE BEING IMPLANTED; Location: [facility]; Surgeon: Mylinda Latina, MD  ? LUNG BIOPSY    ? MASTECTOMY Left   ? 2000, [facility]  ? ROTATOR CUFF REPAIR Right   ? [place]  ? XI ROBOTIC ASSISTED COLOSTOMY TAKEDOWN N/A 10/23/2021  ? Procedure: XI ROBOTIC ASSISTED ILEOSTOMY TAKEDOWN;  Surgeon: Herbert Pun, MD;  Location: [facility] ORS;  Service: General;  Laterality: N/A;  180 minutes for the surgery part please  ?  ?Medications Prior to Admission  ?Medication Sig Dispense Refill Last Dose  ? amiodarone (PACERONE) 200 MG tablet Take 1 tablet (200 mg total) by mouth 2 (two) times daily. 60 tablet 0 02/20/2022  ? apixaban (ELIQUIS) 2.5 MG TABS tablet Take 2.5 mg by mouth 2 (two) times daily.   02/20/2022 at 1900  ? atorvastatin (LIPITOR) 40 MG tablet Take 1 tablet (40 mg total) by mouth daily. 30 tablet 2 02/20/2022  ? citalopram (CELEXA) 20 MG tablet Take 1 tablet (20 mg total) by mouth daily. 30 tablet 2 02/20/2022  ? Fluticasone-Umeclidin-Vilant 100-62.5-25 MCG/INH AEPB Inhale 1 puff into the lungs every morning.   02/20/2022  ? metoprolol tartrate (LOPRESSOR) 25 MG tablet Take 1 tablet (25 mg total) by mouth 2 (two) times daily. 60 tablet 2 02/20/2022  ? Multiple Vitamin (MULTIVITAMIN WITH MINERALS) TABS tablet Take 1 tablet by mouth daily.   02/20/2022  ? pantoprazole (PROTONIX) 40 MG tablet Take 1 tablet (40 mg total) by mouth 2 (two) times  daily before a meal. 60 tablet 2 02/20/2022  ? torsemide 40 MG TABS Take 40 mg by mouth daily. 30 tablet 2 02/20/2022  ? zolpidem (AMBIEN) 5 MG tablet Take 5 mg by mouth at bedtime as needed for sleep.   02/20/2022  ? acetaminophen (TYLENOL) 325 MG tablet Take 2 tablets (650 mg total) by mouth every 6 (six) hours as needed for mild pain (or Fever >/= 101). (Patient taking differently: Take 650 mg by mouth every 4 (four) hours as needed for moderate pain.)   prn  ? albuterol (PROVENTIL HFA;VENTOLIN HFA) 108 (90 Base) MCG/ACT inhaler Inhale 2 puffs into the  lungs every 6 (six) hours as needed for wheezing or shortness of breath. 1 Inhaler 2 prn  ? calcium carbonate (TUMS - DOSED IN MG ELEMENTAL CALCIUM) 500 MG chewable tablet Chew 1 tablet by mouth daily.   prn

## 2022-02-23 NOTE — Anesthesia Preprocedure Evaluation (Signed)
Anesthesia Evaluation  ?Patient identified by MRN, date of birth, ID band ?Patient awake ? ? ? ?Reviewed: ?Allergy & Precautions, NPO status , Patient's Chart, lab work & pertinent test results ? ?History of Anesthesia Complications ?Negative for: history of anesthetic complications ? ?Airway ?Mallampati: III ? ?TM Distance: >3 FB ?Neck ROM: full ? ? ? Dental ? ?(+) Chipped, Poor Dentition, Missing ?  ?Pulmonary ?shortness of breath and with exertion, pneumonia, COPD,  COPD inhaler and oxygen dependent, former smoker,  ?  ? ?+ decreased breath sounds ? ? ? ? ? Cardiovascular ?hypertension, (-) angina+CHF and + DOE  ?+ dysrhythmias Atrial Fibrillation + Valvular Problems/Murmurs AS  ?Rhythm:irregular Rate:Normal ?+ Systolic Click ? ?  ?Neuro/Psych ?PSYCHIATRIC DISORDERS TIA  ? GI/Hepatic ?Neg liver ROS, PUD, GERD  ,  ?Endo/Other  ?negative endocrine ROS ? Renal/GU ?Renal disease  ?negative genitourinary ?  ?Musculoskeletal ? ? Abdominal ?  ?Peds ? Hematology ?  ?Anesthesia Other Findings ?Past Medical History: ?No date: Anemia ?No date: Anxiety ?No date: Aortic atherosclerosis ([place]) ?02/10/2018: Aortic valve stenosis ?    Comment:  a.) TTE 02/10/2018: EF 55-60%: mild AS with MPG of 12  ?             mmHg. b.) TTE 04/21/2018: EF 35-40%; mild AS with MPG 8  ?             mmHg. c.) TTE 11/07/2019: EF 50-55%; mild AS with MPG 10  ?             mmHg. ?No date: Arthritis ?No date: Atrial fibrillation ([place]) ?    Comment:  a.) CHA2DS2-VASc = 4 (age, sex, HTN, aortic plaque). b.) ?             Rate/rhythm maintained on oral carvedilol; chronically  ?             anticoagulated on dose reduced apixaban. c.)  Attempted  ?             deployment of LAA occlusive device on 01/11/2021;  ?             parameters failed and procedure aborted. ?2000: Breast cancer, left ([place]) ?    Comment:  a.) T2N1M0; ER/PR (+) --> Tx'd with total mastectomy, LN ?             resection, XRT, and  chemotherapy ?07/30/2016: Cancer of right lung ([place]) ?    Comment:  a.) adenocarcinoma; ALK, ROS1, PDL1, BRAF, EGFR all  ?             negative. ?0/26/3785: Chronic diastolic CHF (congestive heart failure) ([place]) ?No date: CKD (chronic kidney disease), stage IV ([place]) ?No date: COPD (chronic obstructive pulmonary disease) ([place]) ?No date: Dependence on supplemental oxygen ?No date: Depression ?88/50/2774: Diastolic dysfunction ?    Comment:  a.) TTE 02/10/2017: EF 55-60%; G2DD. b.) TTE 04/21/2018: ?             EF 35-40%; mild LA dilation; mod MV regurgitation. c.)  ?             TTE 11/07/2019: EF 50-55%; G1DD. ?No date: DOE (dyspnea on exertion) ?No date: GERD (gastroesophageal reflux disease) ?No date: Heart murmur ?10/14/2019: History of 2019 novel coronavirus disease (COVID-19) ?No date: HLD (hyperlipidemia) ?No date: Hypertension ?No date: Long term current use of anticoagulant ?    Comment:  a.) apixaban ?No date: Lymphedema ?No date: Personal history of chemotherapy ?No date: Personal history of radiation therapy ?  11/05/2020: SBO (small bowel obstruction) ([place]) ?No date: Vitamin D deficiency ? ?Past Surgical History: ?No date: Breast Biospy; Left ?    Comment:  [facility] ?No date: BREAST SURGERY ?04/30/2018: COLONOSCOPY; N/A ?    Comment:  Procedure: COLONOSCOPY;  Surgeon: Virgel Manifold,  ?             MD;  Location: [facility] ENDOSCOPY;  Service: Endoscopy;   ?             Laterality: N/A; ?07/22/2018: COLONOSCOPY; N/A ?    Comment:  Procedure: COLONOSCOPY;  Surgeon: Virgel Manifold,  ?             MD;  Location: [facility] ENDOSCOPY;  Service: Endoscopy;   ?             Laterality: N/A; ?09/21/2021: COLONOSCOPY WITH PROPOFOL; N/A ?    Comment:  Procedure: COLONOSCOPY WITH PROPOFOL;  Surgeon: Lysle Pearl,  ?             Isami, DO;  Location: [facility] ENDOSCOPY;  Service: General;  ?             Laterality: N/A; ?No date: DILATION AND CURETTAGE OF UTERUS ?04/11/2016: ELECTROMAGNETIC NAVIGATION BROCHOSCOPY; Right ?     Comment:  Procedure: ELECTROMAGNETIC NAVIGATION BRONCHOSCOPY;   ?             Surgeon: Vilinda Boehringer, MD;  Location: [facility] ORS;   ?             Service: Cardiopulmonary;  Laterality: Right; ?07/22/2018: ESOPHAGOGASTRODUODENOSCOPY; N/A ?    Comment:  Procedure: ESOPHAGOGASTRODUODENOSCOPY (EGD);  Surgeon:  ?             Virgel Manifold, MD;  Location: [facility] ENDOSCOPY;   ?             Service: Endoscopy;  Laterality: N/A; ?05/07/2018: ESOPHAGOGASTRODUODENOSCOPY (EGD) WITH PROPOFOL; N/A ?    Comment:  Procedure: ESOPHAGOGASTRODUODENOSCOPY (EGD) WITH  ?             PROPOFOL;  Surgeon: Lucilla Lame, MD;  Location: [facility]  ?             ENDOSCOPY;  Service: Endoscopy;  Laterality: N/A; ?04/24/2019: ESOPHAGOGASTRODUODENOSCOPY (EGD) WITH PROPOFOL; N/A ?    Comment:  Procedure: ESOPHAGOGASTRODUODENOSCOPY (EGD) WITH  ?             PROPOFOL;  Surgeon: Jonathon Bellows, MD;  Location[facility]  ?             ENDOSCOPY;  Service: Gastroenterology;  Laterality: N/A; ?01/12/2020: ESOPHAGOGASTRODUODENOSCOPY (EGD) WITH PROPOFOL; N/A ?    Comment:  Procedure: ESOPHAGOGASTRODUODENOSCOPY (EGD) WITH  ?             PROPOFOL;  Surgeon: Jonathon Bellows, MD;  Location: [facility]  ?             ENDOSCOPY;  Service: Gastroenterology;  Laterality: N/A; ?04/28/2020: ESOPHAGOGASTRODUODENOSCOPY (EGD) WITH PROPOFOL; N/A ?    Comment:  Procedure: ESOPHAGOGASTRODUODENOSCOPY (EGD) WITH  ?             PROPOFOL;  Surgeon: Jonathon Bellows, MD;  Location: [facility]  ?             ENDOSCOPY;  Service: Gastroenterology;  Laterality: N/A; ?05/07/2019: EUS; N/A ?    Comment:  Procedure: FULL UPPER ENDOSCOPIC ULTRASOUND (EUS)  ?             RADIAL;  Surgeon: Jola Schmidt, MD;  Location: [facility]  ?  ENDOSCOPY;  Service: Endoscopy;  Laterality: N/A; ?07/22/2018: ILEOSCOPY; N/A ?    Comment:  Procedure: ILEOSCOPY THROUGH STOMA;  Surgeon: Bonna Gains, ?             Lennette Bihari, MD;  Location: [facility] ENDOSCOPY;  Service:  ?             Endoscopy;  Laterality: N/A; ?No date:  ILEOSTOMY ?09/08/2018: ILEOSTOMY; N/A ?    Comment:  Procedure: ILEOSTOMY REVISION POSSIBLE CREATION;   ?             Surgeon: Herbert Pun, MD;  Location: [facility] ORS;  ?             Service: General;  Laterality: N/A; ?08/15/2018: ILEOSTOMY CLOSURE; N/A ?    Comment:  Procedure: DILATION OF ILEOSTOMY STRICTURE;  Surgeon:  ?             Herbert Pun, MD;  Location: [facility] ORS;  Service: ?             General;  Laterality: N/A; ?05/04/2018: LAPAROTOMY; Right ?    Comment:  Procedure: EXPLORATORY LAPAROTOMY right colectomy right  ?             and left ostomy;  Surgeon: Herbert Pun, MD;   ?             Location: [facility] ORS;  Service: General;  Laterality:  ?             Right; ?01/11/2021: LEFT ATRIAL APPENDAGE OCCLUSION; N/A ?    Comment:  Procedure: LEFT ATRIAL APPENDAGE OCCLUSION (WATCHMAN's  ?             DEVICE); ABORTED PROCEDURE WITHOUT DEVICE BEING  ?             IMPLANTED; Location: [facility]; Surgeon: Mylinda Latina, MD ?No date: LUNG BIOPSY ?No date: MASTECTOMY; Left ?    Comment:  2000, [facility] ?No date: ROTATOR CUFF REPAIR; Right ?    Comment:  [facility] ?10/23/2021: XI ROBOTIC ASSISTED COLOSTOMY TAKEDOWN; N/A ?    Comment:  Procedure: XI ROBOTIC ASSISTED ILEOSTOMY TAKEDOWN;   ?             Surgeon: Herbert Pun, MD;  Location: [facility] ORS;  ?             Service: General;  Laterality: N/A;  180 minutes for the  ?             surgery part please ? ?BMI   ? Body Mass Index: 18.67 kg/m?  ?  ? ? Reproductive/Obstetrics ?negative OB ROS ? ?  ? ? ? ? ? ? ? ? ? ? ? ? ? ?  ?  ? ? ? ? ? ? ? ? ?Anesthesia Physical ?Anesthesia Plan ? ?ASA: 4 ? ?Anesthesia Plan: General  ? ?Post-op Pain Management:   ? ?Induction: Intravenous ? ?PONV Risk Score and Plan: Propofol infusion and TIVA ? ?Airway Management Planned: Natural Airway and Nasal Cannula ? ?Additional Equipment:  ? ?Intra-op Plan:  ? ?Post-operative Plan:  ? ?Informed Consent: I have reviewed the patients History and Physical, chart, labs and  discussed the procedure including the risks, benefits and alternatives for the proposed anesthesia with the patient or authorized representative who has indicated his/her understanding and acceptance.  ? ? ? ?Dental Advisory Given ? ?Plan Discussed

## 2022-02-23 NOTE — Progress Notes (Signed)
?PROGRESS NOTE ? ? ? ?Danielle [name]  ZOX:[id number] DOB: 10-17-1955 DOA: 02/21/2022 ?PCP: [name], [place]  ? ?Brief Narrative:  ?66 year old with history of COPD on 2 L nasal cannula, systolic CHF EF 81%, CKD stage IV, remote history of breast cancer, lung cancer status post radiation, esophageal cancer status postradiation, MGUS, depression, HTN, HLD, A-fib on Eliquis admitted for shortness of breath.  She was found to be in atrial fibrillation with RVR.  CT scan in the hospital showed right-sided stable pleural effusion.  Underwent cardioversion by cardiology which was successful. ? ? ?Assessment & Plan: ? Principal Problem: ?  Atrial flutter ([place]) ?Active Problems: ?  Atrial fibrillation with RVR ([place]) ?  ?Atrial fibrillation/flutter with RVR ?- Initially required Cardizem drip now it has been discontinued.  Currently on oral medications- Amiodarone, metoprolol twice daily ?- Continue Eliquis ?- Successful cardioversion 5/12. ? ?Right-sided pleural effusion ?- Somewhat chronic.  Due to some dyspnea on exertion, thoracentesis ordered.  Already on torsemide, will give additional dose of Lasix IV. ? ?CKD stage IV ?- Creatinine is stable continue to monitor ? ?Essential hypertension ?-Continue Lopressor, torsemide ? ?chronic congestive heart failure with reduced ejection fraction, EF 35%.   ?-Continue home torsemide, Lopressor twice daily ? ?History of COPD with chronic hypoxia on 2 L nasal cannula ?- Currently patient is on 2 L nasal cannula.  Continue to monitor this. ? ?History of lung cancer/breast cancer ?- Follows outpatient oncology. ? ?PT/OT is recommended home health ? ? ?DVT prophylaxis: On Eliquis ?Code Status: Full code ?Family Communication:   ? ?Still dyspnea on exertion, will give IV Lasix later today.  We will plan for ultrasound-guided thoracentesis. ? ? ?Subjective: ?Heart rate improved but still has dyspnea on exertion.  Underwent TEE this afternoon which was  successful. ? ?Examination: ? ?Constitutional: Not in acute distress, 3 L nasal cannula.  Elderly frail ?Respiratory: Bilateral diminished breath sounds especially on the right side ?Cardiovascular: Normal sinus rhythm, no rubs ?Abdomen: Nontender nondistended good bowel sounds ?Musculoskeletal: No edema noted ?Skin: No rashes seen ?Neurologic: CN 2-12 grossly intact.  And nonfocal ?Psychiatric: Normal judgment and insight. Alert and oriented x 3. Normal mood. ?Objective: ?Vitals:  ? 02/23/22 1030 02/23/22 1045 02/23/22 1051 02/23/22 1118  ?BP: 112/78 118/75 126/79 128/85  ?Pulse: 66 65 67 66  ?Resp: 16 14 (!) 21 19  ?Temp:    98 ?F (36.7 ?C)  ?TempSrc:    Oral  ?SpO2: 99% 100% 100% 100%  ?Weight:      ?Height:      ? ?No intake or output data in the 24 hours ending 02/23/22 1313 ? ?Filed Weights  ? 02/21/22 1634 02/23/22 0400  ?Weight: 53.5 kg 52.5 kg  ? ? ? ?Data Reviewed:  ? ?CBC: ?Recent Labs  ?Lab 02/21/22 ?1636 02/22/22 ?0610  ?WBC 6.0 4.9  ?NEUTROABS 3.7  --   ?HGB 10.9* 9.4*  ?HCT 33.8* 29.5*  ?MCV 95.2 94.6  ?PLT 195 190  ? ?Basic Metabolic Panel: ?Recent Labs  ?Lab 02/21/22 ?1636 02/22/22 ?0610 02/23/22 ?0540  ?NA 132* 134* 137  ?K 3.7 3.8 3.7  ?CL 97* 101 103  ?CO2 24 26 26   ?GLUCOSE 73 78 93  ?BUN 39* 38* 46*  ?CREATININE 2.00* 1.92* 2.20*  ?CALCIUM 9.4 9.0 9.1  ?MG  --   --  1.7  ? ?GFR: ?Estimated Creatinine Clearance: 20.6 mL/min (A) (by C-G formula based on SCr of 2.2 mg/dL (H)). ?Liver Function Tests: ?Recent Labs  ?Lab 02/21/22 ?  1636 02/22/22 ?0610  ?AST 21 20  ?ALT 13 12  ?ALKPHOS 68 55  ?BILITOT 0.7 0.9  ?PROT 8.0 6.8  ?ALBUMIN 3.9 3.2*  ? ?No results for input(s): LIPASE, AMYLASE in the last 168 hours. ?No results for input(s): AMMONIA in the last 168 hours. ?Coagulation Profile: ?Recent Labs  ?Lab 02/22/22 ?2230  ?INR 1.3*  ? ?Cardiac Enzymes: ?No results for input(s): CKTOTAL, CKMB, CKMBINDEX, TROPONINI in the last 168 hours. ?BNP (last 3 results) ?No results for input(s): PROBNP in the last  8760 hours. ?HbA1C: ?No results for input(s): HGBA1C in the last 72 hours. ?CBG: ?No results for input(s): GLUCAP in the last 168 hours. ?Lipid Profile: ?No results for input(s): CHOL, HDL, LDLCALC, TRIG, CHOLHDL, LDLDIRECT in the last 72 hours. ?Thyroid Function Tests: ?Recent Labs  ?  02/22/22 ?1217  ?TSH 1.323  ? ?Anemia Panel: ?No results for input(s): VITAMINB12, FOLATE, FERRITIN, TIBC, IRON, RETICCTPCT in the last 72 hours. ?Sepsis Labs: ?Recent Labs  ?Lab 02/21/22 ?1642  ?PROCALCITON 0.27  ? ? ?No results found for this or any previous visit (from the past 240 hour(s)).  ? ? ? ? ? ?Radiology Studies: ?CT Chest Wo Contrast ? ?Result Date: 02/21/2022 ?CLINICAL DATA:  Chest wall pain, nontraumatic, infection or inflammation suspected, xray done Patient with history of multiple cancers. Patient has increasing pain right chest EXAM: CT CHEST WITHOUT CONTRAST TECHNIQUE: Multidetector CT imaging of the chest was performed following the standard protocol without IV contrast. RADIATION DOSE REDUCTION: This exam was performed according to the departmental dose-optimization program which includes automated exposure control, adjustment of the mA and/or kV according to patient size and/or use of iterative reconstruction technique. COMPARISON:  Radiograph earlier today. Chest CT 01/26/2022, additional prior chest CT reviewed FINDINGS: Cardiovascular: Moderately advanced aortic atherosclerosis. No aortic aneurysm. Mild cardiomegaly. There are coronary artery calcifications. Mitral annulus calcifications. Dilated central pulmonary arteries, main pulmonary artery measures 3.6 cm, right pulmonary artery measures 3.1 cm, left pulmonary artery measures 3.2 cm. There is no pericardial effusion. Mediastinum/Nodes: Decreased subcarinal node currently measuring 14 mm, previously 16 mm, series 3, image 70. Additional scattered small mediastinal lymph nodes are not enlarged by size criteria. Lack of IV contrast significantly limits  assessment for hilar adenopathy. Small hiatal hernia with patulous esophagus. Lungs/Pleura: Moderate emphysema. Layering right pleural effusion with associated atelectasis, not significantly changed in appearance from prior CT. Irregular nodular opacity in the right upper lobe measuring 2.4 x 1.5 cm, series 4, image 59, not significantly changed on my retrospective measurement. Masslike opacity in the anterior right upper lobe measures 3.7 x 2 cm, series 4, image 69, possibly smaller versus stable allowing for differences in caliper placement in slice selection. Calcified left apical pleuroparenchymal thickening is stable in related scarring. There is no acute airspace disease or new pulmonary nodule. No left pleural effusion. Stable areas of subpleural thickening in the anterior left lung. Upper Abdomen: Stable 11 mm water density lesion in the left lobe of the liver, not metabolic in prior PET and consistent with cyst. Dense upper abdominal aortic atherosclerosis. No acute upper abdominal findings. Musculoskeletal: Stable concavity of L2 superior endplate. No focal rib lesion or rib fracture to explain right-sided pain. There are remote left anterior rib fractures. Left mastectomy. Chest wall soft tissue abnormalities. IMPRESSION: 1. Layering right pleural effusion with associated atelectasis, not significantly changed in appearance from CT last month. 2. Two areas of masslike opacity in the right upper lobe. 1 of these areas appears stable, the other  may be slightly smaller or unchanged, regardless there is no progression. These may represent areas of post radiation scarring. Continued oncologic follow-up is recommended. 3. No new or acute airspace disease. 4. Decreased size of subcarinal node from prior exam. 5. Cardiomegaly with aortic atherosclerosis. Coronary artery calcifications. Aortic Atherosclerosis (ICD10-I70.0) and Emphysema (ICD10-J43.9). Electronically Signed   By: Keith Rake M.D.   On:  02/21/2022 18:04  ? ?DG Chest Portable 1 View ? ?Result Date: 02/21/2022 ?CLINICAL DATA:  Short of breath EXAM: PORTABLE CHEST 1 VIEW COMPARISON:  02/01/2022 FINDINGS: Progression of right lower lobe airspace disease and small right effusion. Right

## 2022-02-24 LAB — BASIC METABOLIC PANEL
Anion gap: 11 (ref 5–15)
BUN: 48 mg/dL — ABNORMAL HIGH (ref 8–23)
CO2: 26 mmol/L (ref 22–32)
Calcium: 9.4 mg/dL (ref 8.9–10.3)
Chloride: 101 mmol/L (ref 98–111)
Creatinine, Ser: 2.26 mg/dL — ABNORMAL HIGH (ref 0.44–1.00)
GFR, Estimated: 23 mL/min — ABNORMAL LOW (ref >60)
Glucose, Bld: 104 mg/dL — ABNORMAL HIGH (ref 70–99)
Potassium: 3.5 mmol/L (ref 3.5–5.1)
Sodium: 138 mmol/L (ref 135–145)

## 2022-02-24 LAB — MAGNESIUM: Magnesium: 2.1 mg/dL (ref 1.7–2.4)

## 2022-02-24 MED ORDER — POTASSIUM CHLORIDE CRYS ER 20 MEQ PO TBCR
40.0000 meq | EXTENDED_RELEASE_TABLET | Freq: Once | ORAL | Status: AC
  Administered 2022-02-24: 40 meq via ORAL
  Filled 2022-02-24: qty 2

## 2022-02-24 NOTE — TOC Transition Note (Signed)
Transition of Care (TOC) - CM/SW Discharge Note ? ? ?Patient Details  ?Name: Danielle [name] ?MRN: [id number] ?Date of Birth: 1956/01/08 ? ?Transition of Care (TOC) CM/SW Contact:  ?Alberteen Sam, LCSW ?Phone Number: ?02/24/2022, 11:21 AM ? ? ?Clinical Narrative:    ? ?Patient to discharge home today, declines HH and reports having walker at home.  ? ?Patient reports she has home O2 through [employer] but will need a tank to get home. CSW contacted [place] with [employer] to deliver to room.  ? ?No other dc needs identified.  ? ?Final next level of care: Home/Self Care ?Barriers to Discharge: No Barriers Identified ? ? ?Patient Goals and CMS Choice ?Patient states their goals for this hospitalization and ongoing recovery are:: to go home ?CMS Medicare.gov Compare Post Acute Care list provided to:: Patient ?Choice offered to / list presented to : Patient ? ?Discharge Placement ?  ?           ?  ?  ?  ?  ? ?Discharge Plan and Services ?  ?  ?           ?  ?  ?  ?  ?  ?  ?  ?  ?  ?  ? ?Social Determinants of Health (SDOH) Interventions ?  ? ? ?Readmission Risk Interventions ? ?  01/29/2022  ? 11:36 AM 11/29/2021  ? 10:49 AM 10/16/2019  ?  3:20 PM  ?Readmission Risk Prevention Plan  ?Transportation Screening Complete Complete Complete  ?Medication Review Press photographer) Complete Complete Referral to Pharmacy  ?PCP or Specialist appointment within 3-5 days of discharge Complete Complete   ?[place] or Home Care Consult  Complete Complete  ?SW Recovery Care/Counseling Consult Complete Complete Complete  ?Palliative Care Screening Not Applicable Not Applicable Not Applicable  ?[place] Not Applicable Not Applicable Not Applicable  ? ? ? ? ? ?

## 2022-02-24 NOTE — Progress Notes (Signed)
Physical Therapy Treatment ?Patient Details ?Name: Danielle [name] ?MRN: [id number] ?DOB: 02/10/1956 ?Today's Date: 02/24/2022 ? ? ?History of Present Illness 66 yo female with pmh that includes advanced COPD, chronic kidney disease, atrial fib, mild aortic stenosis, h.o of lung cancer and esophageal cancer presenting with SOB in the setting of atrial flutter with RVR. ? ?  ?PT Comments  ? ? Pt was supine in bed upon arriving. A and O and well known by Chief Strategy Officer. She agrees to session and is cooperative throughout. On 3 L O2 at baseline and was able to maintain sao2 >94% on 3 L. Has personal O2 tank in hospital room. Easily able to exit bed, stand, and ambulate without physical assistance. She tolerated OOB activity well. Pt would benefit form HHPT to maximize strength, activity tolerance, and overall safety with ADLs.  ?  ?Recommendations for follow up therapy are one component of a multi-disciplinary discharge planning process, led by the attending physician.  Recommendations may be updated based on patient status, additional functional criteria and insurance authorization. ? ?Follow Up Recommendations ? Home health PT ?  ?  ?Assistance Recommended at Discharge Intermittent Supervision/Assistance  ?Patient can return home with the following A little help with bathing/dressing/bathroom;A lot of help with bathing/dressing/bathroom;Assistance with cooking/housework ?  ?Equipment Recommendations ? None recommended by PT (pt has RW at home. personal O2 tank in room)  ?  ?   ?Precautions / Restrictions Precautions ?Precautions: Fall ?Restrictions ?Weight Bearing Restrictions: No  ?  ? ?Mobility ? Bed Mobility ?Overal bed mobility: Modified Independent ?  ?Transfers ?Overall transfer level: Modified independent ?  ?Ambulation/Gait ?Ambulation/Gait assistance: Supervision ?Gait Distance (Feet): 120 Feet ?Assistive device: Rolling walker (2 wheels) ?Gait Pattern/deviations: Step-through pattern ?Gait velocity: decreased ?  ?   ?General Gait Details: pt demonstrated safe ability to ambulate 120 ft with RW on 3L o2. Pt's HR between 77-91bpm throughout with sao2 > 97% on 3 L. pt is on 3 L O2 at baseline ? ? ?  ?Balance Overall balance assessment: Modified Independent ?Sitting-balance support: No upper extremity supported ?Sitting balance-Leahy Scale: Good ?  ?  ?Standing balance support: Bilateral upper extremity supported, During functional activity ?Standing balance-Leahy Scale: Good ?  ?  ?  ?  ?Cognition Arousal/Alertness: Awake/alert ?Behavior During Therapy: [facility] for tasks assessed/performed, Flat affect ?Overall Cognitive Status: Within Functional Limits for tasks assessed ?  ? General Comments: Pt is A and O x 4. Well know by Pryor Curia from previous admissions. Seems to be at baseline and is eager to return home. ?  ?  ? ?  ?   ?   ? ?Pertinent Vitals/Pain Pain Assessment ?Pain Assessment: No/denies pain ?Pain Score: 0-No pain  ? ? ? ?PT Goals (current goals can now be found in the care plan section) Acute Rehab PT Goals ?Patient Stated Goal: to go home ?Progress towards PT goals: Progressing toward goals ? ?  ?Frequency ? ? ? Min 2X/week ? ? ? ?  ?PT Plan Current plan remains appropriate  ? ? ?   ?AM-PAC PT "6 Clicks" Mobility   ?Outcome Measure ? Help needed turning from your back to your side while in a flat bed without using bedrails?: None ?Help needed moving from lying on your back to sitting on the side of a flat bed without using bedrails?: None ?Help needed moving to and from a bed to a chair (including a wheelchair)?: None ?Help needed standing up from a chair using your arms (e.g., wheelchair or bedside  chair)?: None ?Help needed to walk in hospital room?: A Little ?Help needed climbing 3-5 steps with a railing? : A Little ?6 Click Score: 22 ? ?  ?End of Session Equipment Utilized During Treatment: Oxygen (3 L) ?Activity Tolerance: Patient tolerated treatment well ?Patient left: in bed;with call bell/phone within reach ?Nurse  Communication: Mobility status ?PT Visit Diagnosis: Other abnormalities of gait and mobility (R26.89);Muscle weakness (generalized) (M62.81) ?  ? ? ?Time: 4158-3094 ?PT Time Calculation (min) (ACUTE ONLY): 26 min ? ?Charges:  $Gait Training: 8-22 mins ?$Therapeutic Activity: 8-22 mins          ?          ? ?Julaine Fusi PTA ?02/24/22, 8:40 AM  ? ?

## 2022-02-24 NOTE — Progress Notes (Signed)
Discharge instructions explained to pt, verbalized understanding. IV and tele removed. Will transport off unit via wheelchair.  ?

## 2022-02-24 NOTE — Discharge Summary (Signed)
Physician Discharge Summary  ?GRIER CZERWINSKI STM:[id number] DOB: Mar 03, 1956 DOA: 02/21/2022 ? ?PCP: [name], [place] ? ?Admit date: 02/21/2022 ?Discharge date: 02/24/2022 ? ?Admitted From: Home ?Disposition: Home ? ?Recommendations for Outpatient Follow-up:  ?Follow up with PCP in 1-2 weeks ?Please obtain BMP/CBC in one week your next doctors visit.  ?Successful cardioversion in the hospital.  Follow-up outpatient cardiology ? ? ?Discharge Condition: Stable ?CODE STATUS: Full code ?Diet recommendation: Heart healthy ? ?Brief/Interim Summary: ?66 year old with history of COPD on 2 L nasal cannula, systolic CHF EF 89%, CKD stage IV, remote history of breast cancer, lung cancer status post radiation, esophageal cancer status postradiation, MGUS, depression, HTN, HLD, A-fib on Eliquis admitted for shortness of breath.  She was found to be in atrial fibrillation with RVR.  CT scan in the hospital showed right-sided stable pleural effusion.  Underwent cardioversion by cardiology which was successful.  Patient also underwent thoracentesis, 650 cc was removed.  This helped her respiratory symptoms significantly.  She also did well with physical therapy who recommended home health therefore arrangements were made. ?Medically stable for discharge.  Spoke with the patient's daughter on the day of discharge as well, all the questions answered. ?  ?  ?Assessment & Plan: ? Principal Problem: ?  Atrial flutter ([place]) ?Active Problems: ?  Atrial fibrillation with RVR ([place]) ?  ?Atrial fibrillation/flutter with RVR ?- Resume her home medication amiodarone and metoprolol ?- Continue Eliquis ?- Successful cardioversion 5/12. ?  ?Right-sided pleural effusion ?- Somewhat chronic.  But due to some dyspnea on exertion she underwent thoracentesis on 5/12, 650 cc removed.  No evidence of infection.  Continue her home torsemide. ?  ?CKD stage IV ?- Creatinine is stable continue to monitor ? ?Essential hypertension ?-Continue Lopressor,  torsemide ? ?chronic congestive heart failure with reduced ejection fraction, EF 35%.   ?-Continue home torsemide, Lopressor twice daily ?  ?History of COPD with chronic hypoxia on 2 L nasal cannula ?- Continue her home supplemental oxygen.  As needed bronchodilators. ?  ?History of lung cancer/breast cancer ?- Follows outpatient oncology. ?  ?PT/OT is recommended home health, arrangements made ? ?Discharge Diagnoses:  ?Principal Problem: ?  Atrial flutter ([place]) ?Active Problems: ?  Atrial fibrillation with RVR ([place]) ? ? ? ? ? ?Consultations: ?Cardiology ? ?Subjective: ?Feels well no complaints.  I spoke with the patient's daughter over the phone, all the answers were answered ? ?Discharge Exam: ?Vitals:  ? 02/24/22 0942 02/24/22 0955  ?BP: 100/73   ?Pulse: 67   ?Resp: 20 18  ?Temp: 98.2 ?F (36.8 ?C)   ?SpO2:    ? ?Vitals:  ? 02/24/22 0414 02/24/22 0416 02/24/22 0942 02/24/22 0955  ?BP:  119/82 100/73   ?Pulse:  65 67   ?Resp:   20 18  ?Temp:   98.2 ?F (36.8 ?C)   ?TempSrc:   Oral   ?SpO2:  95%    ?Weight: 50 kg     ?Height:      ? ? ?General: Pt is alert, awake, not in acute distress, 2 L nasal cannula.  Elderly frail ?Cardiovascular: RRR, S1/S2 +, no rubs, no gallops ?Respiratory: CTA bilaterally, no wheezing, no rhonchi ?Abdominal: Soft, NT, ND, bowel sounds + ?Extremities: no edema, no cyanosis ? ?Discharge Instructions ? ?Discharge Instructions   ? ? Amb referral to AFIB Clinic   Complete by: As directed ?  ? Face-to-face encounter (required for Medicare/Medicaid patients)   Complete by: As directed ?  ? I Chrystian Ressler Chirag Mearl Olver  certify that this patient is under my care and that I, or a nurse practitioner or physician's assistant working with me, had a face-to-face encounter that meets the physician face-to-face encounter requirements with this patient on 02/24/2022. The encounter with the patient was in whole, or in part for the following medical condition(s) which is the primary reason for home health care  weakness  ? The encounter with the patient was in whole, or in part, for the following medical condition, which is the primary reason for home health care: weakness  ? I certify that, based on my findings, the following services are medically necessary home health services: Physical therapy  ? Reason for Medically Necessary Home Health Services: Therapy- Therapeutic Exercises to Increase Strength and Endurance  ? My clinical findings support the need for the above services: Unsafe ambulation due to balance issues  ? Further, I certify that my clinical findings support that this patient is homebound due to: Ambulates short distances less than 300 feet  ? Home Health   Complete by: As directed ?  ? To provide the following care/treatments:  PT ?OT  ?  ? ?  ? ?Allergies as of 02/24/2022   ?No Known Allergies ?  ? ?  ?Medication List  ?  ? ?TAKE these medications   ? ?acetaminophen 325 MG tablet ?Commonly known as: TYLENOL ?Take 2 tablets (650 mg total) by mouth every 6 (six) hours as needed for mild pain (or Fever >/= 101). ?What changed:  ?when to take this ?reasons to take this ?  ?albuterol 108 (90 Base) MCG/ACT inhaler ?Commonly known as: VENTOLIN HFA ?Inhale 2 puffs into the lungs every 6 (six) hours as needed for wheezing or shortness of breath. ?  ?amiodarone 200 MG tablet ?Commonly known as: PACERONE ?Take 1 tablet (200 mg total) by mouth 2 (two) times daily. ?  ?apixaban 2.5 MG Tabs tablet ?Commonly known as: ELIQUIS ?Take 2.5 mg by mouth 2 (two) times daily. ?  ?atorvastatin 40 MG tablet ?Commonly known as: LIPITOR ?Take 1 tablet (40 mg total) by mouth daily. ?  ?calcium carbonate 500 MG chewable tablet ?Commonly known as: TUMS - dosed in mg elemental calcium ?Chew 1 tablet by mouth daily. ?  ?citalopram 20 MG tablet ?Commonly known as: CELEXA ?Take 1 tablet (20 mg total) by mouth daily. ?  ?feeding supplement (NEPRO CARB STEADY) Liqd ?Take 237 mLs by mouth 3 (three) times daily between meals. ?   ?Fluticasone-Umeclidin-Vilant 100-62.5-25 MCG/INH Aepb ?Inhale 1 puff into the lungs every morning. ?  ?guaiFENesin-dextromethorphan 100-10 MG/5ML syrup ?Commonly known as: ROBITUSSIN DM ?Take 5 mLs by mouth every 4 (four) hours as needed for cough. ?  ?metoprolol tartrate 25 MG tablet ?Commonly known as: LOPRESSOR ?Take 1 tablet (25 mg total) by mouth 2 (two) times daily. ?  ?midodrine 5 MG tablet ?Commonly known as: PROAMATINE ?Take 1 tablet (5 mg total) by mouth 3 (three) times daily with meals. ?  ?multivitamin with minerals Tabs tablet ?Take 1 tablet by mouth daily. ?  ?OXYGEN ?Inhale 2 L into the lungs as needed (shortness of breath). ?  ?pantoprazole 40 MG tablet ?Commonly known as: Protonix ?Take 1 tablet (40 mg total) by mouth 2 (two) times daily before a meal. ?  ?phenol 1.4 % Liqd ?Commonly known as: CHLORASEPTIC ?Use as directed 1 spray in the mouth or throat as needed for throat irritation / pain. ?  ?Torsemide 40 MG Tabs ?Take 40 mg by mouth daily. ?  ?zolpidem 5  MG tablet ?Commonly known as: AMBIEN ?Take 5 mg by mouth at bedtime as needed for sleep. ?  ? ?  ? ? Follow-up Information   ? ? [name], PA Follow up in 1 week(s).   ?Specialty: Family Medicine ?Contact information: ?[place] [address] ?[phone] ? ? ?  ?  ? ?  ?  ? ?  ? ?No Known Allergies ? ?You were cared for by a hospitalist during your hospital stay. If you have any questions about your discharge medications or the care you received while you were in the hospital after you are discharged, you can call the unit and asked to speak with the hospitalist on call if the hospitalist that took care of you is not available. Once you are discharged, your primary care physician will handle any further medical issues. Please note that no refills for any discharge medications will be authorized once you are discharged, as it is imperative that you return to your primary care physician (or establish a relationship with  a primary care physician if you do not have one) for your aftercare needs so that they can reassess your need for medications and monitor your lab values. ? ? ?Procedures/Studies: ?DG Chest 1 View ? ?Result Date: 01/26/2022 ?CLINI

## 2022-02-24 NOTE — Plan of Care (Signed)
?  Problem: Activity: ?Goal: Ability to tolerate increased activity will improve ?Outcome: Progressing ?  ?Problem: Cardiac: ?Goal: Ability to achieve and maintain adequate cardiopulmonary perfusion will improve ?Outcome: Progressing ?  ?

## 2022-02-27 LAB — BODY FLUID CULTURE W GRAM STAIN: Culture: NO GROWTH

## 2022-02-28 ENCOUNTER — Inpatient Hospital Stay: Payer: Medicare Other

## 2022-02-28 ENCOUNTER — Inpatient Hospital Stay: Payer: Medicare Other | Attending: Internal Medicine

## 2022-02-28 DIAGNOSIS — C155 Malignant neoplasm of lower third of esophagus: Secondary | ICD-10-CM

## 2022-02-28 DIAGNOSIS — I130 Hypertensive heart and chronic kidney disease with heart failure and stage 1 through stage 4 chronic kidney disease, or unspecified chronic kidney disease: Secondary | ICD-10-CM | POA: Diagnosis present

## 2022-02-28 DIAGNOSIS — N184 Chronic kidney disease, stage 4 (severe): Secondary | ICD-10-CM | POA: Insufficient documentation

## 2022-02-28 DIAGNOSIS — D631 Anemia in chronic kidney disease: Secondary | ICD-10-CM | POA: Diagnosis not present

## 2022-02-28 LAB — HEMOGLOBIN: Hemoglobin: 10.7 g/dL — ABNORMAL LOW (ref 12.0–15.0)

## 2022-02-28 LAB — HEMATOCRIT: HCT: 32.4 % — ABNORMAL LOW (ref 36.0–46.0)

## 2022-03-01 LAB — FUNGAL ORGANISM REFLEX

## 2022-03-01 LAB — FUNGUS CULTURE WITH STAIN

## 2022-03-01 LAB — FUNGUS CULTURE RESULT

## 2022-03-06 NOTE — Progress Notes (Unsigned)
Patient ID: Danielle [name], female    DOB: September 22, 1956, 66 y.o.   MRN: [id number]  HPI  Danielle [name] is a 66 y/o female with a history of breast/ lung/ esophageal cancer, DM, HTN, COPD, anxiety, depression, lymphedema, previous tobacco use and chronic heart failure.   Echo report from 01/30/22 reviewed and showed an EF of 35-40% along with moderate MR, moderate/severe TR and moderate AS. Echo report from 11/07/19 reviewed and showed an EF of 50-55% along with mild TR and moderately elevated PA pressure. Echo report from 04/21/18 reviewed and showed an EF of 35-40% along with mild AS/ trivial AR and moderate MR.   Admitted 02/21/22 due to shortness of breath and found to be in AF RVR. Cardiology consult obtained. Successful cardioversion done. Chest CT showed stable right-sided pleural effusion. Underwent thoracentesis, 650 cc was removed. Discharged after 3 days.   She presents today for a follow-up visit although hasn't been seen since February 2021. She presents with a chief complaint of  Past Medical History:  Diagnosis Date   Anemia    Anxiety    Aortic atherosclerosis ([place])    Aortic valve stenosis 02/10/2018   a.) TTE 02/10/2018: EF 55-60%: mild AS with MPG of 12 mmHg. b.) TTE 04/21/2018: EF 35-40%; mild AS with MPG 8 mmHg. c.) TTE 11/07/2019: EF 50-55%; mild AS with MPG 10 mmHg.   Arthritis    Atrial fibrillation ([facility])    a.) CHA2DS2-VASc = 4 (age, sex, HTN, aortic plaque). b.) Rate/rhythm maintained on oral carvedilol; chronically anticoagulated on dose reduced apixaban. c.)  Attempted deployment of LAA occlusive device on 01/11/2021; parameters failed and procedure aborted.   Breast cancer, left ([place]) 2000   a.) T2N1M0; ER/PR (+) --> Tx'd with total mastectomy, LN resection, XRT, and chemotherapy   Cancer of right lung ([place]) 07/30/2016   a.) adenocarcinoma; ALK, ROS1, PDL1, BRAF, EGFR all negative.   Chronic diastolic CHF (congestive heart failure) ([place]) 11/26/2021   CKD (chronic kidney  disease), stage IV (H[facility])    COPD (chronic obstructive pulmonary disease) (H[facility])    Dependence on supplemental oxygen    Depression    Diastolic dysfunction 95/06/3266   a.) TTE 02/10/2017: EF 55-60%; G2DD. b.) TTE 04/21/2018: EF 35-40%; mild LA dilation; mod MV regurgitation. c.) TTE 11/07/2019: EF 50-55%; G1DD.   DOE (dyspnea on exertion)    GERD (gastroesophageal reflux disease)    Heart murmur    History of 2019 novel coronavirus disease (COVID-19) 10/14/2019   HLD (hyperlipidemia)    Hypertension    Long term current use of anticoagulant    a.) apixaban   Lymphedema    Personal history of chemotherapy    Personal history of radiation therapy    SBO (small bowel obstruction) ([place]) 11/05/2020   Vitamin D deficiency    Past Surgical History:  Procedure Laterality Date   Breast Biospy Left    [facility]   BREAST SURGERY     CARDIOVERSION N/A 02/23/2022   Procedure: CARDIOVERSION;  Surgeon: Corey Skains, MD;  Location: [facility] ORS;  Service: Cardiovascular;  Laterality: N/A;   COLONOSCOPY N/A 04/30/2018   Procedure: COLONOSCOPY;  Surgeon: Virgel Manifold, MD;  Location: [facility] ENDOSCOPY;  Service: Endoscopy;  Laterality: N/A;   COLONOSCOPY N/A 07/22/2018   Procedure: COLONOSCOPY;  Surgeon: Virgel Manifold, MD;  Location: [facility] ENDOSCOPY;  Service: Endoscopy;  Laterality: N/A;   COLONOSCOPY WITH PROPOFOL N/A 09/21/2021   Procedure: COLONOSCOPY WITH PROPOFOL;  Surgeon: Benjamine Sprague, DO;  Location:  [facility] ENDOSCOPY;  Service: General;  Laterality: N/A;   DILATION AND CURETTAGE OF UTERUS     ELECTROMAGNETIC NAVIGATION BROCHOSCOPY Right 04/11/2016   Procedure: ELECTROMAGNETIC NAVIGATION BRONCHOSCOPY;  Surgeon: Vilinda Boehringer, MD;  Location: [facility] ORS;  Service: Cardiopulmonary;  Laterality: Right;   ESOPHAGOGASTRODUODENOSCOPY N/A 07/22/2018   Procedure: ESOPHAGOGASTRODUODENOSCOPY (EGD);  Surgeon: Virgel Manifold, MD;  Location: [facility] ENDOSCOPY;  Service: Endoscopy;  Laterality: N/A;    ESOPHAGOGASTRODUODENOSCOPY (EGD) WITH PROPOFOL N/A 05/07/2018   Procedure: ESOPHAGOGASTRODUODENOSCOPY (EGD) WITH PROPOFOL;  Surgeon: Lucilla Lame, MD;  Location: [facility] ENDOSCOPY;  Service: Endoscopy;  Laterality: N/A;   ESOPHAGOGASTRODUODENOSCOPY (EGD) WITH PROPOFOL N/A 04/24/2019   Procedure: ESOPHAGOGASTRODUODENOSCOPY (EGD) WITH PROPOFOL;  Surgeon: Jonathon Bellows, MD;  Location: [facility] ENDOSCOPY;  Service: Gastroenterology;  Laterality: N/A;   ESOPHAGOGASTRODUODENOSCOPY (EGD) WITH PROPOFOL N/A 01/12/2020   Procedure: ESOPHAGOGASTRODUODENOSCOPY (EGD) WITH PROPOFOL;  Surgeon: Jonathon Bellows, MD;  Location: [facility] ENDOSCOPY;  Service: Gastroenterology;  Laterality: N/A;   ESOPHAGOGASTRODUODENOSCOPY (EGD) WITH PROPOFOL N/A 04/28/2020   Procedure: ESOPHAGOGASTRODUODENOSCOPY (EGD) WITH PROPOFOL;  Surgeon: Jonathon Bellows, MD;  Location: [facility] ENDOSCOPY;  Service: Gastroenterology;  Laterality: N/A;   EUS N/A 05/07/2019   Procedure: FULL UPPER ENDOSCOPIC ULTRASOUND (EUS) RADIAL;  Surgeon: Jola Schmidt, MD;  Location: [facility] ENDOSCOPY;  Service: Endoscopy;  Laterality: N/A;   ILEOSCOPY N/A 07/22/2018   Procedure: ILEOSCOPY THROUGH STOMA;  Surgeon: Virgel Manifold, MD;  Location: [facility] ENDOSCOPY;  Service: Endoscopy;  Laterality: N/A;   ILEOSTOMY     ILEOSTOMY N/A 09/08/2018   Procedure: ILEOSTOMY REVISION POSSIBLE CREATION;  Surgeon: Herbert Pun, MD;  Location: [facility] ORS;  Service: General;  Laterality: N/A;   ILEOSTOMY CLOSURE N/A 08/15/2018   Procedure: DILATION OF ILEOSTOMY STRICTURE;  Surgeon: Herbert Pun, MD;  Location: [facility] ORS;  Service: General;  Laterality: N/A;   LAPAROTOMY Right 05/04/2018   Procedure: EXPLORATORY LAPAROTOMY right colectomy right and left ostomy;  Surgeon: Herbert Pun, MD;  Location: [facility] ORS;  Service: General;  Laterality: Right;   LEFT ATRIAL APPENDAGE OCCLUSION N/A 01/11/2021   Procedure: LEFT ATRIAL APPENDAGE OCCLUSION ([place]); ABORTED  PROCEDURE WITHOUT DEVICE BEING IMPLANTED; Location: [facility]; Surgeon: Mylinda Latina, MD   LUNG BIOPSY     MASTECTOMY Left    2000, [place] Right    [place]   XI ROBOTIC ASSISTED COLOSTOMY TAKEDOWN N/A 10/23/2021   Procedure: XI ROBOTIC ASSISTED ILEOSTOMY TAKEDOWN;  Surgeon: Herbert Pun, MD;  Location: [facility] ORS;  Service: General;  Laterality: N/A;  180 minutes for the surgery part please   Family History  Problem Relation Age of Onset   Breast cancer Mother 29   Cancer Mother        Breast    Cirrhosis Father    Breast cancer Paternal Aunt 30   Cancer Maternal Aunt        Breast    Social History   Tobacco Use   Smoking status: Former    Packs/day: 0.50    Years: 20.00    Pack years: 10.00    Types: Cigarettes    Quit date: 07/02/2012    Years since quitting: 9.6   Smokeless tobacco: Current    Types: Snuff   Tobacco comments:    quit 2014  Substance Use Topics   Alcohol use: Yes    Comment: Occasionally beer   No Known Allergies    Review of Systems  Constitutional:  Negative for appetite change and fatigue.  HENT:  Negative for congestion, rhinorrhea and sore throat.  Eyes: Negative.   Respiratory:  Positive for chest tightness (at times) and shortness of breath (with moderate exertion). Negative for cough.   Cardiovascular:  Positive for leg swelling (around ankles). Negative for chest pain and palpitations.  Gastrointestinal:  Negative for abdominal distention and abdominal pain.  Endocrine: Negative.   Genitourinary: Negative.   Musculoskeletal:  Negative for back pain and neck pain.  Skin: Negative.   Allergic/Immunologic: Negative.   Neurological:  Positive for headaches. Negative for dizziness and light-headedness.  Hematological:  Negative for adenopathy. Does not bruise/bleed easily.  Psychiatric/Behavioral:  Negative for dysphoric mood and sleep disturbance (trouble falling asleep; sleeping on 2 pillows). The patient is not  nervous/anxious.      Physical Exam Vitals and nursing note reviewed.  Constitutional:      Appearance: She is well-developed.  HENT:     Head: Normocephalic and atraumatic.  Neck:     Vascular: No JVD.  Cardiovascular:     Rate and Rhythm: Normal rate and regular rhythm.  Pulmonary:     Effort: Pulmonary effort is normal. No respiratory distress.     Breath sounds: No wheezing or rales.  Abdominal:     General: There is no distension.     Palpations: Abdomen is soft.  Musculoskeletal:     Cervical back: Normal range of motion and neck supple.     Right lower leg: No tenderness. No edema.     Left lower leg: No tenderness. No edema.  Skin:    General: Skin is warm and dry.  Neurological:     Mental Status: She is alert and oriented to person, place, and time.  Psychiatric:        Behavior: Behavior normal.   Assessment & Plan:  1: Chronic heart failure with reduced ejection fraction- - NYHA class II - euvolemic today - weighing daily and she was reminded to call for an overnight weight gain of >2 pounds or a weekly weight gain of >5 pounds - on GDMT of  - saw pulmonology Raul Del) 09/03/2019 - BNP 02/21/22 was 1059.9   2: HTN- - BP  - sees PCP (@ [facility])  - BMP  reviewed 02/24/22 and showed sodium 138, potassium 3.5, creatinine 2.26 and GFR 23 - sees nephrology Holley Raring) 02/21/22  3: Atrial fibrillation- - saw cardiology Petra Kuba) 02/16/22 - successfully cardioverted during recent admission   Patient did not bring her medications nor a list. Each medication was verbally reviewed with the patient and she was encouraged to bring the bottles to every visit to confirm accuracy of list.

## 2022-03-07 ENCOUNTER — Encounter: Payer: Self-pay | Admitting: Family

## 2022-03-07 ENCOUNTER — Ambulatory Visit (HOSPITAL_BASED_OUTPATIENT_CLINIC_OR_DEPARTMENT_OTHER): Payer: Medicare Other | Admitting: Family

## 2022-03-07 ENCOUNTER — Encounter: Payer: Self-pay | Admitting: Pharmacist

## 2022-03-07 VITALS — BP 122/80 | HR 85 | Resp 16 | Ht 66.0 in | Wt 115.1 lb

## 2022-03-07 DIAGNOSIS — I083 Combined rheumatic disorders of mitral, aortic and tricuspid valves: Secondary | ICD-10-CM | POA: Insufficient documentation

## 2022-03-07 DIAGNOSIS — Z7901 Long term (current) use of anticoagulants: Secondary | ICD-10-CM | POA: Insufficient documentation

## 2022-03-07 DIAGNOSIS — Z923 Personal history of irradiation: Secondary | ICD-10-CM | POA: Insufficient documentation

## 2022-03-07 DIAGNOSIS — Z7951 Long term (current) use of inhaled steroids: Secondary | ICD-10-CM | POA: Insufficient documentation

## 2022-03-07 DIAGNOSIS — K219 Gastro-esophageal reflux disease without esophagitis: Secondary | ICD-10-CM | POA: Insufficient documentation

## 2022-03-07 DIAGNOSIS — Z79899 Other long term (current) drug therapy: Secondary | ICD-10-CM | POA: Insufficient documentation

## 2022-03-07 DIAGNOSIS — I4891 Unspecified atrial fibrillation: Secondary | ICD-10-CM | POA: Insufficient documentation

## 2022-03-07 DIAGNOSIS — K922 Gastrointestinal hemorrhage, unspecified: Secondary | ICD-10-CM | POA: Diagnosis not present

## 2022-03-07 DIAGNOSIS — E1122 Type 2 diabetes mellitus with diabetic chronic kidney disease: Secondary | ICD-10-CM | POA: Insufficient documentation

## 2022-03-07 DIAGNOSIS — I890 Lymphedema, not elsewhere classified: Secondary | ICD-10-CM | POA: Insufficient documentation

## 2022-03-07 DIAGNOSIS — Z9221 Personal history of antineoplastic chemotherapy: Secondary | ICD-10-CM | POA: Insufficient documentation

## 2022-03-07 DIAGNOSIS — N184 Chronic kidney disease, stage 4 (severe): Secondary | ICD-10-CM | POA: Insufficient documentation

## 2022-03-07 DIAGNOSIS — Z85118 Personal history of other malignant neoplasm of bronchus and lung: Secondary | ICD-10-CM | POA: Insufficient documentation

## 2022-03-07 DIAGNOSIS — I10 Essential (primary) hypertension: Secondary | ICD-10-CM

## 2022-03-07 DIAGNOSIS — E785 Hyperlipidemia, unspecified: Secondary | ICD-10-CM | POA: Insufficient documentation

## 2022-03-07 DIAGNOSIS — J449 Chronic obstructive pulmonary disease, unspecified: Secondary | ICD-10-CM | POA: Diagnosis not present

## 2022-03-07 DIAGNOSIS — I480 Paroxysmal atrial fibrillation: Secondary | ICD-10-CM | POA: Diagnosis not present

## 2022-03-07 DIAGNOSIS — Z87891 Personal history of nicotine dependence: Secondary | ICD-10-CM | POA: Insufficient documentation

## 2022-03-07 DIAGNOSIS — I5022 Chronic systolic (congestive) heart failure: Secondary | ICD-10-CM | POA: Insufficient documentation

## 2022-03-07 DIAGNOSIS — I130 Hypertensive heart and chronic kidney disease with heart failure and stage 1 through stage 4 chronic kidney disease, or unspecified chronic kidney disease: Secondary | ICD-10-CM | POA: Insufficient documentation

## 2022-03-07 DIAGNOSIS — Z9981 Dependence on supplemental oxygen: Secondary | ICD-10-CM | POA: Insufficient documentation

## 2022-03-07 DIAGNOSIS — Z8501 Personal history of malignant neoplasm of esophagus: Secondary | ICD-10-CM | POA: Insufficient documentation

## 2022-03-07 DIAGNOSIS — Z853 Personal history of malignant neoplasm of breast: Secondary | ICD-10-CM | POA: Insufficient documentation

## 2022-03-07 DIAGNOSIS — K921 Melena: Secondary | ICD-10-CM | POA: Diagnosis not present

## 2022-03-07 DIAGNOSIS — F418 Other specified anxiety disorders: Secondary | ICD-10-CM | POA: Insufficient documentation

## 2022-03-07 NOTE — Progress Notes (Signed)
Patient ID: Danielle [name], female   DOB: February 11, 1956, 66 y.o.   MRN: [id number]  [place] - PHARMACIST COUNSELING NOTE  Guideline-Directed Medical Therapy/Evidence Based Medicine  ACE/ARB/ARNI:  none Beta Blocker: Metoprolol tartrate 25 mg twice daily Aldosterone Antagonist:  none Diuretic: Torsemide 40 mg daily SGLT2i:  none  Adherence Assessment  Do you ever forget to take your medication? [] Yes [x] No  Do you ever skip doses due to side effects? [] Yes [x] No  Do you have trouble affording your medicines? [] Yes [x] No  Are you ever unable to pick up your medication due to transportation difficulties? [] Yes [x] No  Do you ever stop taking your medications because you don't believe they are helping? [] Yes [x] No  Do you check your weight daily? [x] Yes [] No   Vital signs: HR 85 pbm, BP 122/80, weight (pounds) 115 lbs ECHO: Date 01/30/2022, EF 35-40%, notes moderate MR, moderate/severe TR and moderate AS     Latest Ref Rng & Units 02/24/2022    4:33 AM 02/23/2022    5:40 AM 02/22/2022    6:10 AM  BMP  Glucose 70 - 99 mg/dL 104   93   78    BUN 8 - 23 mg/dL 48   46   38    Creatinine 0.44 - 1.00 mg/dL 2.26   2.20   1.92    Sodium 135 - 145 mmol/L 138   137   134    Potassium 3.5 - 5.1 mmol/L 3.5   3.7   3.8    Chloride 98 - 111 mmol/L 101   103   101    CO2 22 - 32 mmol/L 26   26   26     Calcium 8.9 - 10.3 mg/dL 9.4   9.1   9.0      Past Medical History:  Diagnosis Date   Anemia    Anxiety    Aortic atherosclerosis ([place])    Aortic valve stenosis 02/10/2018   a.) TTE 02/10/2018: EF 55-60%: mild AS with MPG of 12 mmHg. b.) TTE 04/21/2018: EF 35-40%; mild AS with MPG 8 mmHg. c.) TTE 11/07/2019: EF 50-55%; mild AS with MPG 10 mmHg.   Arthritis    Atrial fibrillation (HCC)    a.) CHA2DS2-VASc = 4 (age, sex, HTN, aortic plaque). b.) Rate/rhythm maintained on oral carvedilol; chronically anticoagulated on dose reduced apixaban. c.)   Attempted deployment of LAA occlusive device on 01/11/2021; parameters failed and procedure aborted.   Breast cancer, left ([place]) 2000   a.) T2N1M0; ER/PR (+) --> Tx'd with total mastectomy, LN resection, XRT, and chemotherapy   Cancer of right lung ([place]) 07/30/2016   a.) adenocarcinoma; ALK, ROS1, PDL1, BRAF, EGFR all negative.   Chronic diastolic CHF (congestive heart failure) ([place]) 11/26/2021   CKD (chronic kidney disease), stage IV (HCC)    COPD (chronic obstructive pulmonary disease) (HCC)    Dependence on supplemental oxygen    Depression    Diastolic dysfunction 38/88/2800   a.) TTE 02/10/2017: EF 55-60%; G2DD. b.) TTE 04/21/2018: EF 35-40%; mild LA dilation; mod MV regurgitation. c.) TTE 11/07/2019: EF 50-55%; G1DD.   DOE (dyspnea on exertion)    GERD (gastroesophageal reflux disease)    Heart murmur    History of 2019 novel coronavirus disease (COVID-19) 10/14/2019   HLD (hyperlipidemia)    Hypertension    Long term current use of anticoagulant    a.) apixaban   Lymphedema    Personal history of chemotherapy  Personal history of radiation therapy    SBO (small bowel obstruction) ([name]) 11/05/2020   Vitamin D deficiency     ASSESSMENT 66 year old female who presents to the HF clinic for follow up. PMH includes hhxof breast/ lung/ esophageal cancer, DM, HTN, COPD with 2L O2 at home, anxiety, depression, lymphedema, previous tobacco use and chronic heart failure. Noted last ED visit was 02/21/2022 for Dyspnea. Patinet renal function decreased, and hx of hypotenskon lmits GDMT recommendations.  Note patient presents to clinic NOT wearing her oxygen with O2 sat at 77%. O2 was placed on patient during appointment. Patient denies problems with current therapy , but reports chronic diarrhea after ileostomy reversal.  PLAN (recommendations)  Continue all medication as currently prescribed Repeat BMET Avoid taking midodrine 3 hrs before bedtime Contract surgeon office to schedule  follow up visit to address chronic GI issues after reversed ileostomy.   Time spent: 20 minutes  Saintclair Schroader Rodriguez-Guzman PharmD, BCPS 03/07/2022 1:45 PM   Current Outpatient Medications:    acetaminophen (TYLENOL) 325 MG tablet, Take 2 tablets (650 mg total) by mouth every 6 (six) hours as needed for mild pain (or Fever >/= 101). (Patient taking differently: Take 650 mg by mouth every 4 (four) hours as needed for moderate pain.), Disp: , Rfl:    albuterol (PROVENTIL HFA;VENTOLIN HFA) 108 (90 Base) MCG/ACT inhaler, Inhale 2 puffs into the lungs every 6 (six) hours as needed for wheezing or shortness of breath., Disp: 1 Inhaler, Rfl: 2   amiodarone (PACERONE) 200 MG tablet, Take 1 tablet (200 mg total) by mouth 2 (two) times daily., Disp: 60 tablet, Rfl: 0   apixaban (ELIQUIS) 2.5 MG TABS tablet, Take 2.5 mg by mouth 2 (two) times daily., Disp: , Rfl:    atorvastatin (LIPITOR) 40 MG tablet, Take 1 tablet (40 mg total) by mouth daily., Disp: 30 tablet, Rfl: 2   calcium carbonate (TUMS - DOSED IN MG ELEMENTAL CALCIUM) 500 MG chewable tablet, Chew 1 tablet by mouth 2 (two) times daily as needed., Disp: , Rfl:    citalopram (CELEXA) 20 MG tablet, Take 1 tablet (20 mg total) by mouth daily., Disp: 30 tablet, Rfl: 2   Fluticasone-Umeclidin-Vilant (TRELEGY ELLIPTA) 100-62.5-25 MCG/ACT AEPB, Inhale 1 puff into the lungs daily., Disp: , Rfl:    metoprolol tartrate (LOPRESSOR) 25 MG tablet, Take 1 tablet (25 mg total) by mouth 2 (two) times daily., Disp: 60 tablet, Rfl: 2   midodrine (PROAMATINE) 5 MG tablet, Take 1 tablet (5 mg total) by mouth 3 (three) times daily with meals., Disp: 60 tablet, Rfl: 0   Multiple Vitamin (MULTIVITAMIN WITH MINERALS) TABS tablet, Take 1 tablet by mouth daily., Disp: , Rfl:    Nutritional Supplements (FEEDING SUPPLEMENT, NEPRO CARB STEADY,) LIQD, Take 237 mLs by mouth 3 (three) times daily between meals., Disp: , Rfl: 0   OXYGEN, Inhale 2 L into the lungs as needed (shortness  of breath)., Disp: , Rfl:    pantoprazole (PROTONIX) 40 MG tablet, Take 1 tablet (40 mg total) by mouth 2 (two) times daily before a meal., Disp: 60 tablet, Rfl: 2   phenol (CHLORASEPTIC) 1.4 % LIQD, Use as directed 1 spray in the mouth or throat as needed for throat irritation / pain., Disp: , Rfl: 0   sodium bicarbonate 650 MG tablet, Take 1,300 mg by mouth daily., Disp: , Rfl:    torsemide 40 MG TABS, Take 40 mg by mouth daily., Disp: 30 tablet, Rfl: 2   zolpidem (AMBIEN) 5  MG tablet, Take 5 mg by mouth at bedtime as needed for sleep., Disp: , Rfl:    MEDICATION ADHERENCES TIPS AND STRATEGIES Taking medication as prescribed improves patient outcomes in heart failure (reduces hospitalizations, improves symptoms, increases survival) Side effects of medications can be managed by decreasing doses, switching agents, stopping drugs, or adding additional therapy. Please let someone in the [facility] know if you have having bothersome side effects so we can modify your regimen. Do not alter your medication regimen without talking to [place].  Medication reminders can help patients remember to take drugs on time. If you are missing or forgetting doses you can try linking behaviors, using pill boxes, or an electronic reminder like an alarm on your phone or an app. Some people can also get automated phone calls as medication reminders.

## 2022-03-07 NOTE — Patient Instructions (Addendum)
Resume weighing daily and call for an overnight weight gain of 3 pounds or more or a weekly weight gain of more than 5 pounds.   If you have voicemail, please make sure your mailbox is cleaned out so that we may leave a message and please make sure to listen to any voicemails.     

## 2022-03-09 ENCOUNTER — Inpatient Hospital Stay
Admission: EM | Admit: 2022-03-09 | Discharge: 2022-03-11 | DRG: 378 | Disposition: A | Discharge status: Home / Self Care | Payer: Medicare Other | Attending: Osteopathic Medicine | Admitting: Osteopathic Medicine

## 2022-03-09 ENCOUNTER — Emergency Department: Payer: Medicare Other

## 2022-03-09 ENCOUNTER — Other Ambulatory Visit: Payer: Self-pay

## 2022-03-09 DIAGNOSIS — Z85118 Personal history of other malignant neoplasm of bronchus and lung: Secondary | ICD-10-CM

## 2022-03-09 DIAGNOSIS — D500 Iron deficiency anemia secondary to blood loss (chronic): Secondary | ICD-10-CM | POA: Diagnosis present

## 2022-03-09 DIAGNOSIS — Z923 Personal history of irradiation: Secondary | ICD-10-CM

## 2022-03-09 DIAGNOSIS — F32A Depression, unspecified: Secondary | ICD-10-CM | POA: Diagnosis present

## 2022-03-09 DIAGNOSIS — I480 Paroxysmal atrial fibrillation: Secondary | ICD-10-CM

## 2022-03-09 DIAGNOSIS — Z7901 Long term (current) use of anticoagulants: Secondary | ICD-10-CM

## 2022-03-09 DIAGNOSIS — I129 Hypertensive chronic kidney disease with stage 1 through stage 4 chronic kidney disease, or unspecified chronic kidney disease: Secondary | ICD-10-CM | POA: Diagnosis present

## 2022-03-09 DIAGNOSIS — E559 Vitamin D deficiency, unspecified: Secondary | ICD-10-CM | POA: Diagnosis present

## 2022-03-09 DIAGNOSIS — Z87891 Personal history of nicotine dependence: Secondary | ICD-10-CM

## 2022-03-09 DIAGNOSIS — D649 Anemia, unspecified: Secondary | ICD-10-CM | POA: Diagnosis present

## 2022-03-09 DIAGNOSIS — Z9012 Acquired absence of left breast and nipple: Secondary | ICD-10-CM

## 2022-03-09 DIAGNOSIS — K648 Other hemorrhoids: Secondary | ICD-10-CM | POA: Diagnosis present

## 2022-03-09 DIAGNOSIS — Z8501 Personal history of malignant neoplasm of esophagus: Secondary | ICD-10-CM

## 2022-03-09 DIAGNOSIS — Z9221 Personal history of antineoplastic chemotherapy: Secondary | ICD-10-CM

## 2022-03-09 DIAGNOSIS — Z853 Personal history of malignant neoplasm of breast: Secondary | ICD-10-CM

## 2022-03-09 DIAGNOSIS — Z9981 Dependence on supplemental oxygen: Secondary | ICD-10-CM

## 2022-03-09 DIAGNOSIS — I5032 Chronic diastolic (congestive) heart failure: Secondary | ICD-10-CM | POA: Diagnosis present

## 2022-03-09 DIAGNOSIS — M1991 Primary osteoarthritis, unspecified site: Secondary | ICD-10-CM | POA: Diagnosis present

## 2022-03-09 DIAGNOSIS — D631 Anemia in chronic kidney disease: Secondary | ICD-10-CM | POA: Diagnosis present

## 2022-03-09 DIAGNOSIS — J449 Chronic obstructive pulmonary disease, unspecified: Secondary | ICD-10-CM

## 2022-03-09 DIAGNOSIS — Z7951 Long term (current) use of inhaled steroids: Secondary | ICD-10-CM

## 2022-03-09 DIAGNOSIS — I4891 Unspecified atrial fibrillation: Secondary | ICD-10-CM | POA: Diagnosis present

## 2022-03-09 DIAGNOSIS — E785 Hyperlipidemia, unspecified: Secondary | ICD-10-CM

## 2022-03-09 DIAGNOSIS — K219 Gastro-esophageal reflux disease without esophagitis: Secondary | ICD-10-CM | POA: Diagnosis present

## 2022-03-09 DIAGNOSIS — Z79899 Other long term (current) drug therapy: Secondary | ICD-10-CM

## 2022-03-09 DIAGNOSIS — K922 Gastrointestinal hemorrhage, unspecified: Secondary | ICD-10-CM | POA: Diagnosis present

## 2022-03-09 DIAGNOSIS — F419 Anxiety disorder, unspecified: Secondary | ICD-10-CM | POA: Diagnosis present

## 2022-03-09 DIAGNOSIS — K921 Melena: Principal | ICD-10-CM | POA: Diagnosis present

## 2022-03-09 DIAGNOSIS — K5730 Diverticulosis of large intestine without perforation or abscess without bleeding: Secondary | ICD-10-CM | POA: Diagnosis present

## 2022-03-09 DIAGNOSIS — K317 Polyp of stomach and duodenum: Secondary | ICD-10-CM | POA: Diagnosis present

## 2022-03-09 DIAGNOSIS — N184 Chronic kidney disease, stage 4 (severe): Secondary | ICD-10-CM

## 2022-03-09 DIAGNOSIS — I10 Essential (primary) hypertension: Secondary | ICD-10-CM

## 2022-03-09 DIAGNOSIS — Z8616 Personal history of COVID-19: Secondary | ICD-10-CM

## 2022-03-09 DIAGNOSIS — R040 Epistaxis: Secondary | ICD-10-CM | POA: Diagnosis present

## 2022-03-09 MED ORDER — PANTOPRAZOLE 80MG IVPB - SIMPLE MED
80.0000 mg | Freq: Once | INTRAVENOUS | Status: AC
  Administered 2022-03-10: 80 mg via INTRAVENOUS
  Filled 2022-03-09: qty 100

## 2022-03-09 MED ORDER — PANTOPRAZOLE INFUSION (NEW) - SIMPLE MED
8.0000 mg/h | INTRAVENOUS | Status: DC
  Administered 2022-03-10 (×2): 8 mg/h via INTRAVENOUS
  Filled 2022-03-09 (×2): qty 100

## 2022-03-09 NOTE — ED Triage Notes (Addendum)
Pt comes from home via  [facility] with complaints of bloody stool that started yesterday and gotten worse today. Pt also complaining if nose bleeds for 3 days. Vitals WDL with EMS. Pt on blood thinners

## 2022-03-09 NOTE — ED Provider Notes (Signed)
[facility] Provider Note    Event Date/Time   First MD Initiated Contact with Patient 03/09/22 2334     (approximate)   History   Rectal Bleeding   HPI  Danielle [name] is a 66 y.o. female brought to the ED via EMS from home with a chief complaint of rectal bleeding and nosebleeds.  Patient is on Eliquis for atrial fibrillation.  Notes intermittent right-sided nosebleed x3 days.  Rectal bleeding once yesterday and again tonight.  Describes dark stool with blood on wiping.  Denies chest pain, abdominal pain, nausea, vomiting or dizziness.  Endorses acute on chronic shortness of breath.  Patient does wear 2 L nasal cannula oxygen at baseline for COPD/CHF.  History of rectal bleeding status post blood transfusion.  Colonoscopy 09/21/2021:Friable (with contact bleeding) mucosa in the sigmoid colon. [name] (with contact bleeding) mucosa in the descending colon. EGD 04/28/2020:Normal examined duodenum. - Normal stomach. - Mucosal nodule found in the esophagus. Biopsied. Clip was placed.  Past Medical History   Past Medical History:  Diagnosis Date   Anemia    Anxiety    Aortic atherosclerosis ([place])    Aortic valve stenosis 02/10/2018   a.) TTE 02/10/2018: EF 55-60%: mild AS with MPG of 12 mmHg. b.) TTE 04/21/2018: EF 35-40%; mild AS with MPG 8 mmHg. c.) TTE 11/07/2019: EF 50-55%; mild AS with MPG 10 mmHg.   Arthritis    Atrial fibrillation (HCC)    a.) CHA2DS2-VASc = 4 (age, sex, HTN, aortic plaque). b.) Rate/rhythm maintained on oral carvedilol; chronically anticoagulated on dose reduced apixaban. c.)  Attempted deployment of LAA occlusive device on 01/11/2021; parameters failed and procedure aborted.   Breast cancer, left ([place]) 2000   a.) T2N1M0; ER/PR (+) --> Tx'd with total mastectomy, LN resection, XRT, and chemotherapy   Cancer of right lung ([place]) 07/30/2016   a.) adenocarcinoma; ALK, ROS1, PDL1, BRAF, EGFR all negative.   Chronic diastolic CHF  (congestive heart failure) ([place]) 11/26/2021   CKD (chronic kidney disease), stage IV (HCC)    COPD (chronic obstructive pulmonary disease) (HCC)    Dependence on supplemental oxygen    Depression    Diastolic dysfunction 31/49/7026   a.) TTE 02/10/2017: EF 55-60%; G2DD. b.) TTE 04/21/2018: EF 35-40%; mild LA dilation; mod MV regurgitation. c.) TTE 11/07/2019: EF 50-55%; G1DD.   DOE (dyspnea on exertion)    GERD (gastroesophageal reflux disease)    Heart murmur    History of 2019 novel coronavirus disease (COVID-19) 10/14/2019   HLD (hyperlipidemia)    Hypertension    Long term current use of anticoagulant    a.) apixaban   Lymphedema    Personal history of chemotherapy    Personal history of radiation therapy    SBO (small bowel obstruction) ([place]) 11/05/2020   Vitamin D deficiency      Active Problem List   Patient Active Problem List   Diagnosis Date Noted   Atrial fibrillation with RVR ([place]) 02/22/2022   Sore throat 02/02/2022   TIA (transient ischemic attack) 01/29/2022   Epistaxis 01/27/2022   Pleural effusion 01/26/2022   Known medical problems 01/26/2022   C. difficile colitis 11/27/2021   Abdominal pain 11/26/2021   HLD (hyperlipidemia) 11/26/2021   Chronic diastolic CHF (congestive heart failure) ([place]) 11/26/2021   Iron deficiency anemia 11/26/2021   Aspiration pneumonia of right lower lobe due to gastric secretions (HCC)    Ileus following gastrointestinal surgery (HCC)    Hypomagnesemia    Anemia  of chronic disease    Hyperphosphatemia 10/24/2021   Hypotension 11/17/2020   Ileostomy status ([place]) 11/17/2020   Acute shoulder pain due to trauma, left 11/17/2020   Syncope 11/17/2020   COPD with acute exacerbation ([place]) 08/01/2020   Atrial fibrillation with rapid ventricular response ([place]) 08/01/2020   Cellulitis of left arm 01/20/2020   CKD (chronic kidney disease), stage IV ([place]) 01/20/2020   Hypokalemia 01/20/2020   Elevated troponin 01/20/2020   Sepsis  ([place]) 01/20/2020   Acute on chronic combined systolic and diastolic CHF (congestive heart failure) ([place]) 01/20/2020   Pleural effusion due to CHF (congestive heart failure) ([place]) 11/06/2019   Diarrhea    Acute renal failure superimposed on stage 3b chronic kidney disease ([place]) 10/29/2019   GERD (gastroesophageal reflux disease) 10/29/2019   Depression 10/29/2019   Hyperkalemia 10/14/2019   History of 2019 novel coronavirus disease (COVID-19) 10/14/2019   Acute respiratory failure due to COVID-19 ([place]) 09/27/2019   Respiratory tract infection due to COVID-19 virus 09/27/2019   Acute blood loss anemia 06/20/2019   Normocytic anemia 06/20/2019   GI bleeding 06/19/2019   Anemia of chronic kidney failure, stage 4 (severe) ([place]) 06/12/2019   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   Acute on chronic respiratory failure with hypoxia and hypercapnia ([place]) 12/22/2018   Ileostomy dysfunction ([place]) 09/28/2018   Reflux esophagitis    Iron deficiency anemia secondary to blood loss (chronic)    SBO (small bowel obstruction) ([place]) 75/07/2584   Chronic systolic heart failure ([place]) 06/09/2018   HTN (hypertension) 06/09/2018   Atrial flutter ([place]) 06/09/2018   Lymphedema 06/09/2018   Acute kidney injury superimposed on CKD ([place]) 06/04/2018   Severe protein-calorie malnutrition ([place]) 06/04/2018   Blood in stool    Focal (segmental) acute (reversible) ischemia of large intestine (HCC)    Ulceration of intestine    Diverticulosis of large intestine without diverticulitis    Abnormal CT scan, colon    Colitis    COPD exacerbation ([place])    Malnutrition of moderate degree 04/23/2018   Palliative care by specialist    DNR (do not resuscitate) discussion    Weakness    Acute on chronic respiratory failure with hypoxia ([place]) 04/21/2018   Hyponatremia 02/10/2017   Syncope and collapse 02/09/2017   Lung nodule, solitary 07/25/2016   Malignant neoplasm of upper lobe of right lung  ([place]) 07/25/2016   Carcinoma of overlapping sites of left breast in female, estrogen receptor positive ([place]) 06/22/2016   Multiple lung nodules 06/22/2016   Lesion of right lung    Breast cancer in female ([place]) 02/21/2016   Moderate COPD (chronic obstructive pulmonary disease) ([place]) 08/24/2014     Past Surgical History   Past Surgical History:  Procedure Laterality Date   Breast Biospy Left    [facility]   BREAST SURGERY     CARDIOVERSION N/A 02/23/2022   Procedure: CARDIOVERSION;  Surgeon: Corey Skains, MD;  Location: [facility] ORS;  Service: Cardiovascular;  Laterality: N/A;   COLONOSCOPY N/A 04/30/2018   Procedure: COLONOSCOPY;  Surgeon: Virgel Manifold, MD;  Location: [facility] ENDOSCOPY;  Service: Endoscopy;  Laterality: N/A;   COLONOSCOPY N/A 07/22/2018   Procedure: COLONOSCOPY;  Surgeon: Virgel Manifold, MD;  Location: [facility] ENDOSCOPY;  Service: Endoscopy;  Laterality: N/A;   COLONOSCOPY WITH PROPOFOL N/A 09/21/2021   Procedure: COLONOSCOPY WITH PROPOFOL;  Surgeon: Benjamine Sprague, DO;  Location: [facility] ENDOSCOPY;  Service: General;  Laterality: N/A;   DILATION AND CURETTAGE OF UTERUS  ELECTROMAGNETIC NAVIGATION BROCHOSCOPY Right 04/11/2016   Procedure: ELECTROMAGNETIC NAVIGATION BRONCHOSCOPY;  Surgeon: Vilinda Boehringer, MD;  Location: [facility] ORS;  Service: Cardiopulmonary;  Laterality: Right;   ESOPHAGOGASTRODUODENOSCOPY N/A 07/22/2018   Procedure: ESOPHAGOGASTRODUODENOSCOPY (EGD);  Surgeon: Virgel Manifold, MD;  [facility] END[facility]COPY;  Service: Endoscopy;  Laterality: N/A;   ESOPHAGOGASTRODUODENOSCOPY (EGD) WITH PROPOFOL N/A 05/07/2018   Procedure: ESOPHAGOGASTRODUODENOSCOPY (EGD) WITH PROPOFOL;  Surgeon: Lucilla Lame, MD;  Location: [facility] ENDOSCOPY;  Service: Endoscopy;  Laterality: N/A;   ESOPHAGOGASTRODUODENOSCOPY (EGD) WITH PROPOFOL N/A 04/24/2019   Procedure: ESOPHAGOGASTRODUODENOSCOPY (EGD) WITH PROPOFOL;  Surgeon: Jonathon Bellows, MD;  Location: [facility] ENDOSCOPY;  Service:  Gastroenterology;  Laterality: N/A;   ESOPHAGOGASTRODUODENOSCOPY (EGD) WITH PROPOFOL N/A 01/12/2020   Procedure: ESOPHAGOGASTRODUODENOSCOPY (EGD) WITH PROPOFOL;  Surgeon: Jonathon Bellows, MD;  Location: [facility] ENDOSCOPY;  Service: Gastroenterology;  Laterality: N/A;   ESOPHAGOGASTRODUODENOSCOPY (EGD) WITH PROPOFOL N/A 04/28/2020   Procedure: ESOPHAGOGASTRODUODENOSCOPY (EGD) WITH PROPOFOL;  Surgeon: Jonathon Bellows, MD;  Location: [facility] ENDOSCOPY;  Service: Gastroenterology;  Laterality: N/A;   EUS N/A 05/07/2019   Procedure: FULL UPPER ENDOSCOPIC ULTRASOUND (EUS) RADIAL;  Surgeon: Jola Schmidt, MD;  Location: [facility] ENDOSCOPY;  Service: Endoscopy;  Laterality: N/A;   ILEOSCOPY N/A 07/22/2018   Procedure: ILEOSCOPY THROUGH STOMA;  Surgeon: Virgel Manifold, MD;  Location: [facility] ENDOSCOPY;  Service: Endoscopy;  Laterality: N/A;   ILEOSTOMY     ILEOSTOMY N/A 09/08/2018   Procedure: ILEOSTOMY REVISION POSSIBLE CREATION;  Surgeon: Herbert Pun, MD;  Location: [facility] ORS;  Service: General;  Laterality: N/A;   ILEOSTOMY CLOSURE N/A 08/15/2018   Procedure: DILATION OF ILEOSTOMY STRICTURE;  Surgeon: Herbert Pun, MD;  Location: [facility] ORS;  Service: General;  Laterality: N/A;   LAPAROTOMY Right 05/04/2018   Procedure: EXPLORATORY LAPAROTOMY right colectomy right and left ostomy;  Surgeon: Herbert Pun, MD;  Location: [facility] ORS;  Service: General;  Laterality: Right;   LEFT ATRIAL APPENDAGE OCCLUSION N/A 01/11/2021   Procedure: LEFT ATRIAL APPENDAGE OCCLUSION ([facility]); ABORTED PROCEDURE WITHOUT DEVICE BEING IMPLANTED; Location: [facility]; Surgeon: Mylinda Latina, MD   LUNG BIOPSY     MASTECTOMY Left    2000, [place]   ROTATOR CUFF REPAIR Right    [place]   XI ROBOTIC ASSISTED COLOSTOMY TAKEDOWN N/A 10/23/2021   Procedure: XI ROBOTIC ASSISTED ILEOSTOMY TAKEDOWN;  Surgeon: Herbert Pun, MD;  Location: [facility] ORS;  Service: General;  Laterality: N/A;  180 minutes for the surgery part  please     Home Medications   Prior to Admission medications   Medication Sig Start Date End Date Taking? Authorizing Provider  acetaminophen (TYLENOL) 325 MG tablet Take 2 tablets (650 mg total) by mouth every 6 (six) hours as needed for mild pain (or Fever >/= 101). Patient taking differently: Take 650 mg by mouth every 4 (four) hours as needed for moderate pain. 12/24/18   Gouru, Illene Silver, MD  albuterol (PROVENTIL HFA;VENTOLIN HFA) 108 (90 Base) MCG/ACT inhaler Inhale 2 puffs into the lungs every 6 (six) hours as needed for wheezing or shortness of breath. 04/24/18   Demetrios Loll, MD  amiodarone (PACERONE) 200 MG tablet Take 1 tablet (200 mg total) by mouth 2 (two) times daily. 11/24/21   Herbert Pun, MD  apixaban (ELIQUIS) 2.5 MG TABS tablet Take 2.5 mg by mouth 2 (two) times daily. 01/11/21   [provider]  atorvastatin (LIPITOR) 40 MG tablet Take 1 tablet (40 mg total) by mouth daily. 02/03/22   Nicole Kindred A, DO  calcium carbonate (TUMS - DOSED IN MG ELEMENTAL CALCIUM)  500 MG chewable tablet Chew 1 tablet by mouth 2 (two) times daily as needed.    [provider]  citalopram (CELEXA) 20 MG tablet Take 1 tablet (20 mg total) by mouth daily. 02/04/22   Ezekiel Slocumb, DO  Fluticasone-Umeclidin-Vilant (TRELEGY ELLIPTA) 100-62.5-25 MCG/ACT AEPB Inhale 1 puff into the lungs daily. 03/05/22   [provider]  metoprolol tartrate (LOPRESSOR) 25 MG tablet Take 1 tablet (25 mg total) by mouth 2 (two) times daily. 02/03/22   Ezekiel Slocumb, DO  midodrine (PROAMATINE) 5 MG tablet Take 1 tablet (5 mg total) by mouth 3 (three) times daily with meals. 11/24/21   Herbert Pun, MD  Multiple Vitamin (MULTIVITAMIN WITH MINERALS) TABS tablet Take 1 tablet by mouth daily. 02/04/22   Ezekiel Slocumb, DO  Nutritional Supplements (FEEDING SUPPLEMENT, NEPRO CARB STEADY,) LIQD Take 237 mLs by mouth 3 (three) times daily between meals. 02/03/22   Nicole Kindred A, DO   OXYGEN Inhale 2 L into the lungs as needed (shortness of breath).    [provider]  pantoprazole (PROTONIX) 40 MG tablet Take 1 tablet (40 mg total) by mouth 2 (two) times daily before a meal. 08/05/20   Pokhrel, [place], MD  phenol (CHLORASEPTIC) 1.4 % LIQD Use as directed 1 spray in the mouth or throat as needed for throat irritation / pain. 02/03/22   Nicole Kindred A, DO  sodium bicarbonate 650 MG tablet Take 1,300 mg by mouth daily.    [provider]  torsemide 40 MG TABS Take 40 mg by mouth daily. 02/04/22   Ezekiel Slocumb, DO  zolpidem (AMBIEN) 5 MG tablet Take 5 mg by mouth at bedtime as needed for sleep. 06/23/21   [provider]     Allergies  Patient has no known allergies.   Family History   Family History  Problem Relation Age of Onset   Breast cancer Mother 66   Cancer Mother        Breast    Cirrhosis Father    Breast cancer Paternal Aunt 36   Cancer Maternal Aunt        Breast      Physical Exam  Triage Vital Signs: ED Triage Vitals  Enc Vitals Group     BP --      Pulse --      Resp --      Temp --      Temp src --      SpO2 --      Weight 03/09/22 2330 118 lb (53.5 kg)     Height 03/09/22 2330 $RemoveBefor'5\' 6"'mwOpVzgIkCYf$  (1.676 m)     Head Circumference --      Peak Flow --      Pain Score 03/09/22 2329 8     Pain Loc --      Pain Edu? --      Excl. in [place]? --     Updated Vital Signs: BP 107/72   Pulse 67   Resp 15   Ht $R'5\' 6"'rl$  (1.676 m)   Wt 53.5 kg   SpO2 97%   BMI 19.05 kg/m    General: Awake, no distress.  CV:  ***Good peripheral perfusion.  Resp:  Normal effort.  CTA B. Abd:  Nontender to light or deep palpation.  No distention.  Other:  No pain or swelling to calves.  Dried blood to right nare; no active bleeding.   ED Results / Procedures / Treatments  Labs (all labs  ordered are listed, but only abnormal results are displayed) Labs Reviewed  COMPREHENSIVE METABOLIC PANEL  CBC WITH DIFFERENTIAL/PLATELET  PROTIME-INR   LACTIC ACID, PLASMA  LIPASE, BLOOD  TYPE AND SCREEN     EKG  ED ECG REPORT I, Lynsey Ange J, the attending physician, personally viewed and interpreted this ECG.   Date: 03/09/2022  EKG Time: ***  Rate: ***  Rhythm: {ekg findings:315101}  Axis: ***  Intervals:{conduction defects:17367}  ST&T Change: ***    RADIOLOGY I have independently visualized and interpreted patient's chest x-ray as well as noted the radiology interpretation:  X-ray:  Official radiology report(s): No results found.   PROCEDURES:  Rectal exam: External exam unremarkable.  Melanotic stool which is immediately heme positive.  Critical Care performed: Yes, see critical care procedure note(s)  CRITICAL CARE Performed by: Paulette Blanch   Total critical care time: *** minutes  Critical care time was exclusive of separately billable procedures and treating other patients.  Critical care was necessary to treat or prevent imminent or life-threatening deterioration.  Critical care was time spent personally by me on the following activities: development of treatment plan with patient and/or surrogate as well as nursing, discussions with consultants, evaluation of patient's response to treatment, examination of patient, obtaining history from patient or surrogate, ordering and performing treatments and interventions, ordering and review of laboratory studies, ordering and review of radiographic studies, pulse oximetry and re-evaluation of patient's condition.   [name]1-3 Lead EKG Interpretation Performed by: Paulette Blanch, MD Authorized by: Paulette Blanch, MD   Comments:     Patient placed on cardiac monitor to evaluate for arrhythmias   MEDICATIONS ORDERED IN ED: Medications  pantoprazole (PROTONIX) 80 mg /NS 100 mL IVPB (has no administration in time range)  pantoprozole (PROTONIX) 80 mg /NS 100 mL infusion (has no administration in time range)     IMPRESSION / MDM / ASSESSMENT AND PLAN / ED COURSE  I  reviewed the triage vital signs and the nursing notes.                             66 year old female on Eliquis presenting with rectal bleeding.  I have identified that this patient may have an acute life-threatening illness. Differential diagnosis includes, but is not limited to, ovarian cyst, ovarian torsion, acute appendicitis, diverticulitis, urinary tract infection/pyelonephritis, endometriosis, bowel obstruction, colitis, renal colic, gastroenteritis, hernia, fibroids, etc.   I have personally reviewed patient's records and see that she had a last heart failure clinic visit on 03/07/2022.  I have also reviewed the results of her last colonoscopy and endoscopy which is noted in the HPI.  The patient is on the cardiac monitor to evaluate for evidence of arrhythmia and/or significant heart rate changes.  We will obtain GI bleed protocol lab work, type and screen.  Initiate IV Protonix bolus with drip.  Anticipate hospitalization.      FINAL CLINICAL IMPRESSION(S) / ED DIAGNOSES   Final diagnoses:  Gastrointestinal hemorrhage with melena     Rx / DC Orders   ED Discharge Orders     None        Note:  This document was prepared using Dragon voice recognition software and may include unintentional dictation errors.

## 2022-03-10 ENCOUNTER — Encounter: Payer: Self-pay | Admitting: Family Medicine

## 2022-03-10 DIAGNOSIS — J449 Chronic obstructive pulmonary disease, unspecified: Secondary | ICD-10-CM

## 2022-03-10 DIAGNOSIS — K317 Polyp of stomach and duodenum: Secondary | ICD-10-CM | POA: Diagnosis present

## 2022-03-10 DIAGNOSIS — Z8616 Personal history of COVID-19: Secondary | ICD-10-CM | POA: Diagnosis not present

## 2022-03-10 DIAGNOSIS — D631 Anemia in chronic kidney disease: Secondary | ICD-10-CM | POA: Diagnosis present

## 2022-03-10 DIAGNOSIS — Z85118 Personal history of other malignant neoplasm of bronchus and lung: Secondary | ICD-10-CM | POA: Diagnosis not present

## 2022-03-10 DIAGNOSIS — R040 Epistaxis: Secondary | ICD-10-CM | POA: Diagnosis present

## 2022-03-10 DIAGNOSIS — I129 Hypertensive chronic kidney disease with stage 1 through stage 4 chronic kidney disease, or unspecified chronic kidney disease: Secondary | ICD-10-CM | POA: Diagnosis present

## 2022-03-10 DIAGNOSIS — K922 Gastrointestinal hemorrhage, unspecified: Secondary | ICD-10-CM | POA: Diagnosis present

## 2022-03-10 DIAGNOSIS — I4891 Unspecified atrial fibrillation: Secondary | ICD-10-CM | POA: Diagnosis present

## 2022-03-10 DIAGNOSIS — D500 Iron deficiency anemia secondary to blood loss (chronic): Secondary | ICD-10-CM | POA: Diagnosis present

## 2022-03-10 DIAGNOSIS — K921 Melena: Principal | ICD-10-CM

## 2022-03-10 DIAGNOSIS — K648 Other hemorrhoids: Secondary | ICD-10-CM | POA: Diagnosis present

## 2022-03-10 DIAGNOSIS — Z923 Personal history of irradiation: Secondary | ICD-10-CM | POA: Diagnosis not present

## 2022-03-10 DIAGNOSIS — K5730 Diverticulosis of large intestine without perforation or abscess without bleeding: Secondary | ICD-10-CM | POA: Diagnosis present

## 2022-03-10 DIAGNOSIS — I480 Paroxysmal atrial fibrillation: Secondary | ICD-10-CM

## 2022-03-10 DIAGNOSIS — Z9012 Acquired absence of left breast and nipple: Secondary | ICD-10-CM | POA: Diagnosis not present

## 2022-03-10 DIAGNOSIS — I10 Essential (primary) hypertension: Secondary | ICD-10-CM | POA: Diagnosis not present

## 2022-03-10 DIAGNOSIS — N184 Chronic kidney disease, stage 4 (severe): Secondary | ICD-10-CM | POA: Diagnosis present

## 2022-03-10 DIAGNOSIS — Z9221 Personal history of antineoplastic chemotherapy: Secondary | ICD-10-CM | POA: Diagnosis not present

## 2022-03-10 DIAGNOSIS — F32A Depression, unspecified: Secondary | ICD-10-CM | POA: Diagnosis present

## 2022-03-10 DIAGNOSIS — E785 Hyperlipidemia, unspecified: Secondary | ICD-10-CM | POA: Diagnosis present

## 2022-03-10 DIAGNOSIS — Z853 Personal history of malignant neoplasm of breast: Secondary | ICD-10-CM | POA: Diagnosis not present

## 2022-03-10 DIAGNOSIS — I5032 Chronic diastolic (congestive) heart failure: Secondary | ICD-10-CM | POA: Diagnosis present

## 2022-03-10 DIAGNOSIS — F419 Anxiety disorder, unspecified: Secondary | ICD-10-CM | POA: Diagnosis present

## 2022-03-10 DIAGNOSIS — Z8501 Personal history of malignant neoplasm of esophagus: Secondary | ICD-10-CM | POA: Diagnosis not present

## 2022-03-10 DIAGNOSIS — Z87891 Personal history of nicotine dependence: Secondary | ICD-10-CM | POA: Diagnosis not present

## 2022-03-10 LAB — HEMOGLOBIN AND HEMATOCRIT, BLOOD
HCT: 27 % — ABNORMAL LOW (ref 36.0–46.0)
HCT: 26.9 % — ABNORMAL LOW (ref 36.0–46.0)
Hemoglobin: 8.9 g/dL — ABNORMAL LOW (ref 12.0–15.0)
Hemoglobin: 8.7 g/dL — ABNORMAL LOW (ref 12.0–15.0)

## 2022-03-10 LAB — CBC
HCT: 28.4 % — ABNORMAL LOW (ref 36.0–46.0)
Hemoglobin: 9.3 g/dL — ABNORMAL LOW (ref 12.0–15.0)
MCH: 30.4 pg (ref 26.0–34.0)
MCHC: 32.7 g/dL (ref 30.0–36.0)
MCV: 92.8 fL (ref 80.0–100.0)
Platelets: 215 10*3/uL (ref 150–400)
RBC: 3.06 MIL/uL — ABNORMAL LOW (ref 3.87–5.11)
RDW: 13.8 % (ref 11.5–15.5)
WBC: 6.4 10*3/uL (ref 4.0–10.5)
nRBC: 0 % (ref 0.0–0.2)

## 2022-03-10 LAB — COMPREHENSIVE METABOLIC PANEL
ALT: 17 U/L (ref 0–44)
AST: 32 U/L (ref 15–41)
Albumin: 4 g/dL (ref 3.5–5.0)
Alkaline Phosphatase: 77 U/L (ref 38–126)
Anion gap: 11 (ref 5–15)
BUN: 42 mg/dL — ABNORMAL HIGH (ref 8–23)
CO2: 25 mmol/L (ref 22–32)
Calcium: 9.1 mg/dL (ref 8.9–10.3)
Chloride: 97 mmol/L — ABNORMAL LOW (ref 98–111)
Creatinine, Ser: 2.42 mg/dL — ABNORMAL HIGH (ref 0.44–1.00)
GFR, Estimated: 21 mL/min — ABNORMAL LOW (ref >60)
Glucose, Bld: 73 mg/dL (ref 70–99)
Potassium: 4 mmol/L (ref 3.5–5.1)
Sodium: 133 mmol/L — ABNORMAL LOW (ref 135–145)
Total Bilirubin: 0.5 mg/dL (ref 0.3–1.2)
Total Protein: 8 g/dL (ref 6.5–8.1)

## 2022-03-10 LAB — CBC WITH DIFFERENTIAL/PLATELET
Abs Immature Granulocytes: 0.02 10*3/uL (ref 0.00–0.07)
Basophils Absolute: 0.1 10*3/uL (ref 0.0–0.1)
Basophils Relative: 1 %
Eosinophils Absolute: 0.1 10*3/uL (ref 0.0–0.5)
Eosinophils Relative: 1 %
HCT: 32.7 % — ABNORMAL LOW (ref 36.0–46.0)
Hemoglobin: 10.7 g/dL — ABNORMAL LOW (ref 12.0–15.0)
Immature Granulocytes: 0 %
Lymphocytes Relative: 20 %
Lymphs Abs: 1.4 10*3/uL (ref 0.7–4.0)
MCH: 30.4 pg (ref 26.0–34.0)
MCHC: 32.7 g/dL (ref 30.0–36.0)
MCV: 92.9 fL (ref 80.0–100.0)
Monocytes Absolute: 0.4 10*3/uL (ref 0.1–1.0)
Monocytes Relative: 6 %
Neutro Abs: 5 10*3/uL (ref 1.7–7.7)
Neutrophils Relative %: 72 %
Platelets: 254 10*3/uL (ref 150–400)
RBC: 3.52 MIL/uL — ABNORMAL LOW (ref 3.87–5.11)
RDW: 13.9 % (ref 11.5–15.5)
WBC: 7 10*3/uL (ref 4.0–10.5)
nRBC: 0.3 % — ABNORMAL HIGH (ref 0.0–0.2)

## 2022-03-10 LAB — TYPE AND SCREEN
ABO/RH(D): O POS
Antibody Screen: NEGATIVE

## 2022-03-10 LAB — BASIC METABOLIC PANEL
Anion gap: 11 (ref 5–15)
BUN: 42 mg/dL — ABNORMAL HIGH (ref 8–23)
CO2: 26 mmol/L (ref 22–32)
Calcium: 8.9 mg/dL (ref 8.9–10.3)
Chloride: 98 mmol/L (ref 98–111)
Creatinine, Ser: 2.01 mg/dL — ABNORMAL HIGH (ref 0.44–1.00)
GFR, Estimated: 27 mL/min — ABNORMAL LOW (ref >60)
Glucose, Bld: 87 mg/dL (ref 70–99)
Potassium: 3.6 mmol/L (ref 3.5–5.1)
Sodium: 135 mmol/L (ref 135–145)

## 2022-03-10 LAB — PROTIME-INR
INR: 1.2 (ref 0.8–1.2)
Prothrombin Time: 15.2 seconds (ref 11.4–15.2)

## 2022-03-10 LAB — LIPASE, BLOOD: Lipase: 30 U/L (ref 11–51)

## 2022-03-10 LAB — LACTIC ACID, PLASMA: Lactic Acid, Venous: 2.1 mmol/L (ref 0.5–1.9)

## 2022-03-10 MED ORDER — PEG 3350-KCL-NA BICARB-NACL 420 G PO SOLR
4000.0000 mL | Freq: Once | ORAL | Status: AC
  Administered 2022-03-10: 4000 mL via ORAL
  Filled 2022-03-10: qty 4000

## 2022-03-10 MED ORDER — PEG 3350-KCL-NA BICARB-NACL 420 G PO SOLR: 2000.0000 mL | Freq: Once | ORAL | Status: DC | PRN

## 2022-03-10 MED ORDER — ALBUTEROL SULFATE (2.5 MG/3ML) 0.083% IN NEBU: 2.5000 mg | INHALATION_SOLUTION | Freq: Four times a day (QID) | RESPIRATORY_TRACT | Status: DC | PRN

## 2022-03-10 MED ORDER — ONDANSETRON HCL 4 MG PO TABS: 4.0000 mg | ORAL_TABLET | Freq: Four times a day (QID) | ORAL | Status: DC | PRN

## 2022-03-10 MED ORDER — PANTOPRAZOLE SODIUM 40 MG PO TBEC: 40.0000 mg | DELAYED_RELEASE_TABLET | Freq: Two times a day (BID) | ORAL | Status: DC

## 2022-03-10 MED ORDER — ATORVASTATIN CALCIUM 20 MG PO TABS
40.0000 mg | ORAL_TABLET | Freq: Every day | ORAL | Status: DC
  Administered 2022-03-10: 40 mg via ORAL
  Filled 2022-03-10 (×2): qty 2

## 2022-03-10 MED ORDER — SODIUM CHLORIDE 0.9 % IV SOLN: INTRAVENOUS | Status: DC

## 2022-03-10 MED ORDER — OXYMETAZOLINE HCL 0.05 % NA SOLN
1.0000 | Freq: Two times a day (BID) | NASAL | Status: DC | PRN
  Administered 2022-03-10: 1 via NASAL
  Filled 2022-03-10: qty 15

## 2022-03-10 MED ORDER — LACTATED RINGERS IV BOLUS
1000.0000 mL | Freq: Once | INTRAVENOUS | Status: AC
  Administered 2022-03-10: 1000 mL via INTRAVENOUS

## 2022-03-10 MED ORDER — ACETAMINOPHEN 650 MG RE SUPP: 650.0000 mg | Freq: Four times a day (QID) | RECTAL | Status: DC | PRN

## 2022-03-10 MED ORDER — AMIODARONE HCL 200 MG PO TABS
200.0000 mg | ORAL_TABLET | Freq: Two times a day (BID) | ORAL | Status: DC
  Administered 2022-03-10 – 2022-03-11 (×3): 200 mg via ORAL
  Filled 2022-03-10 (×3): qty 1

## 2022-03-10 MED ORDER — MIDODRINE HCL 5 MG PO TABS
5.0000 mg | ORAL_TABLET | Freq: Three times a day (TID) | ORAL | Status: DC
  Administered 2022-03-10 – 2022-03-11 (×5): 5 mg via ORAL
  Filled 2022-03-10 (×5): qty 1

## 2022-03-10 MED ORDER — METOPROLOL TARTRATE 25 MG PO TABS
25.0000 mg | ORAL_TABLET | Freq: Two times a day (BID) | ORAL | Status: DC
  Administered 2022-03-10 – 2022-03-11 (×3): 25 mg via ORAL
  Filled 2022-03-10 (×3): qty 1

## 2022-03-10 MED ORDER — CITALOPRAM HYDROBROMIDE 20 MG PO TABS
20.0000 mg | ORAL_TABLET | Freq: Every day | ORAL | Status: DC
  Administered 2022-03-10: 20 mg via ORAL
  Filled 2022-03-10 (×2): qty 1

## 2022-03-10 MED ORDER — ZOLPIDEM TARTRATE 5 MG PO TABS: 5.0000 mg | ORAL_TABLET | Freq: Every evening | ORAL | Status: DC | PRN

## 2022-03-10 MED ORDER — ONDANSETRON HCL 4 MG/2ML IJ SOLN
4.0000 mg | Freq: Four times a day (QID) | INTRAMUSCULAR | Status: DC | PRN
Start: 2022-03-10 — End: 2022-03-11

## 2022-03-10 MED ORDER — ACETAMINOPHEN 325 MG PO TABS
650.0000 mg | ORAL_TABLET | Freq: Four times a day (QID) | ORAL | Status: DC | PRN
  Administered 2022-03-10: 650 mg via ORAL
  Filled 2022-03-10: qty 2

## 2022-03-10 MED ORDER — ADULT MULTIVITAMIN W/MINERALS CH
1.0000 | ORAL_TABLET | Freq: Every day | ORAL | Status: DC
  Administered 2022-03-10: 1 via ORAL
  Filled 2022-03-10 (×2): qty 1

## 2022-03-10 MED ORDER — TRAZODONE HCL 50 MG PO TABS
25.0000 mg | ORAL_TABLET | Freq: Every evening | ORAL | Status: DC | PRN
  Administered 2022-03-10: 25 mg via ORAL
  Filled 2022-03-10: qty 1

## 2022-03-10 MED ORDER — CALCIUM CARBONATE ANTACID 500 MG PO CHEW: 1.0000 | CHEWABLE_TABLET | Freq: Two times a day (BID) | ORAL | Status: DC | PRN

## 2022-03-10 MED ORDER — PANTOPRAZOLE SODIUM 40 MG IV SOLR
40.0000 mg | Freq: Two times a day (BID) | INTRAVENOUS | Status: DC
  Administered 2022-03-10 – 2022-03-11 (×2): 40 mg via INTRAVENOUS
  Filled 2022-03-10 (×3): qty 10

## 2022-03-10 MED ORDER — SODIUM BICARBONATE 650 MG PO TABS
1300.0000 mg | ORAL_TABLET | Freq: Every day | ORAL | Status: DC
  Administered 2022-03-10: 1300 mg via ORAL
  Filled 2022-03-10 (×2): qty 2

## 2022-03-10 NOTE — Assessment & Plan Note (Signed)
Continue home amiodarone. Resume Eliquis 03/12/2022

## 2022-03-10 NOTE — Assessment & Plan Note (Signed)
Continue home Lopressor.

## 2022-03-10 NOTE — Assessment & Plan Note (Signed)
   Continue home statin therapy. 

## 2022-03-10 NOTE — Assessment & Plan Note (Signed)
Continued home albuterol and Trelegy.

## 2022-03-10 NOTE — Consult Note (Signed)
Inpatient Consultation   Patient ID: Danielle [name] is a 66 y.o. female.  Requesting Provider: Dr. Sidney Ace  Date of Admission: 03/09/2022  Date of Consult: 03/10/22   Reason for Consultation: GIB   Patient's Chief Complaint:   Chief Complaint  Patient presents with   Rectal Bleeding    66 year old African-American female with history of anemia, diastolic CHF, CKD 4, COPD, A-fib on Eliquis (last dose Friday evening), squamous cell esophageal cancer, colonic resection due to polyp with ileostomy status post reanastomosis (January 2023) who presents to the hospital with dark stools and occasional brbpr.  Pt presented with ongoing dark stool- not taking iron or pepto bismol. She has also noted epistaxis. Went on to develop some brbpr over last few days. Stools were loose with this going on. Last colonoscopy (09/2021) evaluated remnant colon prior to reanastamosis (January 2023) demonstrated friable mucosa Denies abdominal pain, n/v. Has not had much of an appetite, but weight is stable. BUN 42, Cr 2.42. hgb has downtrended since presentation 10.7 --> 9.3 -->  8.9. Currently on protonix gtt. Otherwise resting in bed comfortably  Last dose eliquis Friday evening 2021- sq cell esophageal cancer  Denies NSAIDs, Anti-plt agents Denies family history of gastrointestinal disease and malignancy Previous Endoscopies: 09/21/21 colonoscopy- friable mucosa.  04/2020- egd- nodule noted at 30 cm from incisors- benign   Past Medical History:  Diagnosis Date   Anemia    Anxiety    Aortic atherosclerosis ([place])    Aortic valve stenosis 02/10/2018   a.) TTE 02/10/2018: EF 55-60%: mild AS with MPG of 12 mmHg. b.) TTE 04/21/2018: EF 35-40%; mild AS with MPG 8 mmHg. c.) TTE 11/07/2019: EF 50-55%; mild AS with MPG 10 mmHg.   Arthritis    Atrial fibrillation (HCC)    a.) CHA2DS2-VASc = 4 (age, sex, HTN, aortic plaque). b.) Rate/rhythm maintained on oral carvedilol; chronically anticoagulated on dose  reduced apixaban. c.)  Attempted deployment of LAA occlusive device on 01/11/2021; parameters failed and procedure aborted.   Breast cancer, left ([place]) 2000   a.) T2N1M0; ER/PR (+) --> Tx'd with total mastectomy, LN resection, XRT, and chemotherapy   Cancer of right lung ([place]) 07/30/2016   a.) adenocarcinoma; ALK, ROS1, PDL1, BRAF, EGFR all negative.   Chronic diastolic CHF (congestive heart failure) ([place]) 11/26/2021   CKD (chronic kidney disease), stage IV (HCC)    COPD (chronic obstructive pulmonary disease) (HCC)    Dependence on supplemental oxygen    Depression    Diastolic dysfunction 71/03/2693   a.) TTE 02/10/2017: EF 55-60%; G2DD. b.) TTE 04/21/2018: EF 35-40%; mild LA dilation; mod MV regurgitation. c.) TTE 11/07/2019: EF 50-55%; G1DD.   DOE (dyspnea on exertion)    GERD (gastroesophageal reflux disease)    Heart murmur    History of 2019 novel coronavirus disease (COVID-19) 10/14/2019   HLD (hyperlipidemia)    Hypertension    Long term current use of anticoagulant    a.) apixaban   Lymphedema    Personal history of chemotherapy    Personal history of radiation therapy    SBO (small bowel obstruction) ([place]) 11/05/2020   Vitamin D deficiency     Past Surgical History:  Procedure Laterality Date   Breast Biospy Left    [facility]   BREAST SURGERY     CARDIOVERSION N/A 02/23/2022   Procedure: CARDIOVERSION;  Surgeon: Corey Skains, MD;  Location: [facility] ORS;  Service: Cardiovascular;  Laterality: N/A;   COLONOSCOPY N/A 04/30/2018   Procedure: COLONOSCOPY;  Surgeon: Virgel Manifold, MD;  Location: [facility] ENDOSCOPY;  Service: Endoscopy;  Laterality: N/A;   COLONOSCOPY N/A 07/22/2018   Procedure: COLONOSCOPY;  Surgeon: Virgel Manifold, MD;  Location: [facility] ENDOSCOPY;  Service: Endoscopy;  Laterality: N/A;   COLONOSCOPY WITH PROPOFOL N/A 09/21/2021   Procedure: COLONOSCOPY WITH PROPOFOL;  Surgeon: Benjamine Sprague, DO;  Location: [facility] ENDOSCOPY;  Service: General;  Laterality:  N/A;   DILATION AND CURETTAGE OF UTERUS     ELECTROMAGNETIC NAVIGATION BROCHOSCOPY Right 04/11/2016   Procedure: ELECTROMAGNETIC NAVIGATION BRONCHOSCOPY;  Surgeon: Vilinda Boehringer, MD;  Location: [facility] ORS;  Service: Cardiopulmonary;  Laterality: Right;   ESOPHAGOGASTRODUODENOSCOPY N/A 07/22/2018   Procedure: ESOPHAGOGASTRODUODENOSCOPY (EGD);  Surgeon: Virgel Manifold, MD;  Location: [facility] ENDOSCOPY;  Service: Endoscopy;  Laterality: N/A;   ESOPHAGOGASTRODUODENOSCOPY (EGD) WITH PROPOFOL N/A 05/07/2018   Procedure: ESOPHAGOGASTRODUODENOSCOPY (EGD) WITH PROPOFOL;  Surgeon: Lucilla Lame, MD;  Location: [facility] ENDOSCOPY;  Service: Endoscopy;  Laterality: N/A;   ESOPHAGOGASTRODUODENOSCOPY (EGD) WITH PROPOFOL N/A 04/24/2019   Procedure: ESOPHAGOGASTRODUODENOSCOPY (EGD) WITH PROPOFOL;  Surgeon: Jonathon Bellows, MD;  Location: [facility] ENDOSCOPY;  Service: Gastroenterology;  Laterality: N/A;   ESOPHAGOGASTRODUODENOSCOPY (EGD) WITH PROPOFOL N/A 01/12/2020   Procedure: ESOPHAGOGASTRODUODENOSCOPY (EGD) WITH PROPOFOL;  Surgeon: Jonathon Bellows, MD;  Location: [facility] ENDOSCOPY;  Service: Gastroenterology;  Laterality: N/A;   ESOPHAGOGASTRODUODENOSCOPY (EGD) WITH PROPOFOL N/A 04/28/2020   Procedure: ESOPHAGOGASTRODUODENOSCOPY (EGD) WITH PROPOFOL;  Surgeon: Jonathon Bellows, MD;  Location: [facility] ENDOSCOPY;  Service: Gastroenterology;  Laterality: N/A;   EUS N/A 05/07/2019   Procedure: FULL UPPER ENDOSCOPIC ULTRASOUND (EUS) RADIAL;  Surgeon: Jola Schmidt, MD;  Location: [facility] ENDOSCOPY;  Service: Endoscopy;  Laterality: N/A;   ILEOSCOPY N/A 07/22/2018   Procedure: ILEOSCOPY THROUGH STOMA;  Surgeon: Virgel Manifold, MD;  Location: [facility] ENDOSCOPY;  Service: Endoscopy;  Laterality: N/A;   ILEOSTOMY     ILEOSTOMY N/A 09/08/2018   Procedure: ILEOSTOMY REVISION POSSIBLE CREATION;  Surgeon: Herbert Pun, MD;  Location: [facility] ORS;  Service: General;  Laterality: N/A;   ILEOSTOMY CLOSURE N/A 08/15/2018   Procedure: DILATION OF  ILEOSTOMY STRICTURE;  Surgeon: Herbert Pun, MD;  Location: [facility] ORS;  Service: General;  Laterality: N/A;   LAPAROTOMY Right 05/04/2018   Procedure: EXPLORATORY LAPAROTOMY right colectomy right and left ostomy;  Surgeon: Herbert Pun, MD;  Location: [facility] ORS;  Service: General;  Laterality: Right;   LEFT ATRIAL APPENDAGE OCCLUSION N/A 01/11/2021   Procedure: LEFT ATRIAL APPENDAGE OCCLUSION ([place]); ABORTED PROCEDURE WITHOUT DEVICE BEING IMPLANTED; Location: [facility]; Surgeon: Mylinda Latina, MD   LUNG BIOPSY     MASTECTOMY Left    2000, [place] Right    [place]   XI ROBOTIC ASSISTED COLOSTOMY TAKEDOWN N/A 10/23/2021   Procedure: XI ROBOTIC ASSISTED ILEOSTOMY TAKEDOWN;  Surgeon: Herbert Pun, MD;  Location: [facility] ORS;  Service: General;  Laterality: N/A;  180 minutes for the surgery part please    No Known Allergies  Family History  Problem Relation Age of Onset   Breast cancer Mother 54   Cancer Mother        Breast    Cirrhosis Father    Breast cancer Paternal Aunt 51   Cancer Maternal Aunt        Breast     Social History   Tobacco Use   Smoking status: Former    Packs/day: 0.50    Years: 20.00    Pack years: 10.00    Types: Cigarettes    Quit date: 07/02/2012    Years since  quitting: 9.6   Smokeless tobacco: Current    Types: Snuff   Tobacco comments:    quit 2014  Vaping Use   Vaping Use: Never used  Substance Use Topics   Alcohol use: Yes    Comment: Occasionally beer   Drug use: No     Pertinent GI related history and allergies were reviewed with the patient  Review of Systems  Constitutional:  Negative for activity change, appetite change, chills, diaphoresis, fatigue, fever and unexpected weight change.  HENT:  Negative for trouble swallowing and voice change.   Respiratory:  Negative for shortness of breath and wheezing.   Cardiovascular:  Negative for chest pain, palpitations and leg swelling.   Gastrointestinal:  Positive for anal bleeding, blood in stool and diarrhea. Negative for abdominal distention, abdominal pain, constipation, nausea, rectal pain and vomiting.  Musculoskeletal:  Negative for arthralgias and myalgias.  Skin:  Negative for color change and pallor.  Neurological:  Negative for dizziness, syncope and weakness.  Psychiatric/Behavioral:  Negative for confusion.   All other systems reviewed and are negative.   Medications Home Medications No current facility-administered medications on file prior to encounter.   Current Outpatient Medications on File Prior to Encounter  Medication Sig Dispense Refill   amiodarone (PACERONE) 200 MG tablet Take 1 tablet (200 mg total) by mouth 2 (two) times daily. 60 tablet 0   apixaban (ELIQUIS) 2.5 MG TABS tablet Take 2.5 mg by mouth 2 (two) times daily.     atorvastatin (LIPITOR) 40 MG tablet Take 1 tablet (40 mg total) by mouth daily. 30 tablet 2   citalopram (CELEXA) 20 MG tablet Take 1 tablet (20 mg total) by mouth daily. 30 tablet 2   Fluticasone-Umeclidin-Vilant (TRELEGY ELLIPTA) 100-62.5-25 MCG/ACT AEPB Inhale 1 puff into the lungs daily.     metoprolol tartrate (LOPRESSOR) 25 MG tablet Take 1 tablet (25 mg total) by mouth 2 (two) times daily. 60 tablet 2   Multiple Vitamin (MULTIVITAMIN WITH MINERALS) TABS tablet Take 1 tablet by mouth daily.     pantoprazole (PROTONIX) 40 MG tablet Take 1 tablet (40 mg total) by mouth 2 (two) times daily before a meal. 60 tablet 2   sodium bicarbonate 650 MG tablet Take 1,300 mg by mouth daily.     torsemide 40 MG TABS Take 40 mg by mouth daily. 30 tablet 2   acetaminophen (TYLENOL) 325 MG tablet Take 2 tablets (650 mg total) by mouth every 6 (six) hours as needed for mild pain (or Fever >/= 101). (Patient taking differently: Take 650 mg by mouth every 4 (four) hours as needed for moderate pain.)     albuterol (PROVENTIL HFA;VENTOLIN HFA) 108 (90 Base) MCG/ACT inhaler Inhale 2 puffs into  the lungs every 6 (six) hours as needed for wheezing or shortness of breath. 1 Inhaler 2   calcium carbonate (TUMS - DOSED IN MG ELEMENTAL CALCIUM) 500 MG chewable tablet Chew 1 tablet by mouth 2 (two) times daily as needed.     midodrine (PROAMATINE) 5 MG tablet Take 1 tablet (5 mg total) by mouth 3 (three) times daily with meals. 60 tablet 0   Nutritional Supplements (FEEDING SUPPLEMENT, NEPRO CARB STEADY,) LIQD Take 237 mLs by mouth 3 (three) times daily between meals.  0   OXYGEN Inhale 2 L into the lungs as needed (shortness of breath).     phenol (CHLORASEPTIC) 1.4 % LIQD Use as directed 1 spray in the mouth or throat as needed for throat irritation / pain.  0   zolpidem (AMBIEN) 5 MG tablet Take 5 mg by mouth at bedtime as needed for sleep.     Pertinent GI related medications were reviewed with the patient  Inpatient Medications  Current Facility-Administered Medications:    0.9 %  sodium chloride infusion, , Intravenous, Continuous, Mansy, Jan A, MD, Last Rate: 100 mL/hr at 03/10/22 0530, Infusion Verify at 03/10/22 0530   acetaminophen (TYLENOL) tablet 650 mg, 650 mg, Oral, Q6H PRN **OR** acetaminophen (TYLENOL) suppository 650 mg, 650 mg, Rectal, Q6H PRN, Mansy, Jan A, MD   albuterol (PROVENTIL) (2.5 MG/3ML) 0.083% nebulizer solution 2.5 mg, 2.5 mg, Inhalation, Q6H PRN, Mansy, Jan A, MD   amiodarone (PACERONE) tablet 200 mg, 200 mg, Oral, BID, Mansy, Jan A, MD   atorvastatin (LIPITOR) tablet 40 mg, 40 mg, Oral, Daily, Mansy, Jan A, MD   calcium carbonate (TUMS - dosed in mg elemental calcium) chewable tablet 200 mg of elemental calcium, 1 tablet, Oral, BID PRN, Mansy, Jan A, MD   citalopram (CELEXA) tablet 20 mg, 20 mg, Oral, Daily, Mansy, Jan A, MD   metoprolol tartrate (LOPRESSOR) tablet 25 mg, 25 mg, Oral, BID, Mansy, Jan A, MD   midodrine (PROAMATINE) tablet 5 mg, 5 mg, Oral, TID WC, Mansy, Jan A, MD, 5 mg at 03/10/22 0801   multivitamin with minerals tablet 1 tablet, 1 tablet,  Oral, Daily, Mansy, Jan A, MD   ondansetron (ZOFRAN) tablet 4 mg, 4 mg, Oral, Q6H PRN **OR** ondansetron (ZOFRAN) injection 4 mg, 4 mg, Intravenous, Q6H PRN, Mansy, Jan A, MD   pantoprozole (PROTONIX) 80 mg /NS 100 mL infusion, 8 mg/hr, Intravenous, Continuous, Sung, Jade J, MD, Last Rate: 10 mL/hr at 03/10/22 0530, 8 mg/hr at 03/10/22 0530   sodium bicarbonate tablet 1,300 mg, 1,300 mg, Oral, Daily, Mansy, Jan A, MD   traZODone (DESYREL) tablet 25 mg, 25 mg, Oral, QHS PRN, Mansy, Jan A, MD   zolpidem (AMBIEN) tablet 5 mg, 5 mg, Oral, QHS PRN, Mansy, Jan A, MD  sodium chloride 100 mL/hr at 03/10/22 0530   pantoprazole 8 mg/hr (03/10/22 0530)    acetaminophen **OR** acetaminophen, albuterol, calcium carbonate, ondansetron **OR** ondansetron (ZOFRAN) IV, traZODone, zolpidem   Objective   Vitals:   03/10/22 0200 03/10/22 0350 03/10/22 0508 03/10/22 0754  BP: 124/74 (!) 128/94 131/79 110/73  Pulse: 66 89 73 71  Resp: $Remo'17 17 18 18  'Hwpns$ Temp:   98.1 F (36.7 C) 98.3 F (36.8 C)  TempSrc:    Oral  SpO2: 100% 99% 99% 94%  Weight:   51 kg   Height:   '5\' 6"'$  (1.676 m)      Physical Exam Vitals and nursing note reviewed.  Constitutional:      General: She is not in acute distress.    Appearance: She is ill-appearing (chronically). She is not toxic-appearing or diaphoretic.     Comments: Thin appearing  HENT:     Head: Normocephalic and atraumatic.     Nose: Nose normal.     Mouth/Throat:     Mouth: Mucous membranes are moist.     Pharynx: Oropharynx is clear.  Eyes:     General: No scleral icterus.    Extraocular Movements: Extraocular movements intact.  Cardiovascular:     Rate and Rhythm: Normal rate and regular rhythm.     Heart sounds: Murmur heard.    No friction rub. No gallop.  Pulmonary:     Effort: Pulmonary effort is normal. No respiratory distress.  Breath sounds: Normal breath sounds. No wheezing, rhonchi or rales.  Abdominal:     General: Bowel sounds are normal.  There is no distension.     Palpations: Abdomen is soft.     Tenderness: There is no abdominal tenderness. There is no guarding or rebound.  Musculoskeletal:     Cervical back: Neck supple.     Right lower leg: No edema.     Left lower leg: No edema.  Skin:    General: Skin is warm and dry.     Coloration: Skin is not jaundiced or pale.  Neurological:     General: No focal deficit present.     Mental Status: She is alert and oriented to person, place, and time. Mental status is at baseline.  Psychiatric:        Mood and Affect: Mood normal.        Behavior: Behavior normal.        Thought Content: Thought content normal.        Judgment: Judgment normal.    Laboratory Data Recent Labs  Lab 03/09/22 2335 03/10/22 0601 03/10/22 0755  WBC 7.0 6.4  --   HGB 10.7* 9.3* 8.9*  HCT 32.7* 28.4* 27.0*  PLT 254 215  --    Recent Labs  Lab 03/09/22 2335 03/10/22 0601  NA 133* 135  K 4.0 3.6  CL 97* 98  CO2 25 26  BUN 42* 42*  CALCIUM 9.1 8.9  PROT 8.0  --   BILITOT 0.5  --   ALKPHOS 77  --   ALT 17  --   AST 32  --   GLUCOSE 73 87   Recent Labs  Lab 03/09/22 2335  INR 1.2    Recent Labs    03/09/22 2335  LIPASE 30        Imaging Studies: DG Chest Port 1 View  Result Date: 03/10/2022 CLINICAL DATA:  Bloody stool EXAM: PORTABLE CHEST 1 VIEW COMPARISON:  02/24/2012, CT 02/21/2022, chest x-ray 02/01/2022 FINDINGS: At least small right-sided pleural effusion. Cardiomegaly with aortic atherosclerosis. Emphysema. Irregular right upper lobe opacity without significant change. IMPRESSION: 1. Cardiomegaly. 2. At least small right-sided pleural effusion, appears increased compared to radiograph 02/24/2012. 3. Similar irregular right upper lobe lung opacity Electronically Signed   By: Donavan Foil M.D.   On: 03/10/2022 00:01    Assessment:   # Melena and BRBPR # Anemia- suspect 2/2 blood loss; hgb down to 8.9  - vss # h/o esophageal cancer # h/o colon resection with  ileostomy now s/p reanastamosis (10/2021(  # CHF # CKD4 # COPD # Afib on eliquis  Plan:  Esophagogastroduodenoscopy and Colonoscopy planned for tomorrow pending patient stability and endoscopy suite availability NPO at midnight Clear liquids now Labs in am- bmp, cbc, inr Protonix 40 mg iv q12 h Hold dvt ppx and home eliquis Monitor H&H.  Transfusion and resuscitation as per primary team Avoid frequent lab draws to prevent lab induced anemia Supportive care and antiemetics as per primary team Maintain two sites IV access Avoid nsaids Monitor for GIB.  Esophagogastroduodenoscopy and Colonoscopy with possible biopsy, control of bleeding, polypectomy, and interventions as necessary has been discussed with the patient/patient representative. Informed consent was obtained from the patient/patient representative after explaining the indication, nature, and risks of the procedure including but not limited to death, bleeding, perforation, missed neoplasm/lesions, cardiorespiratory compromise, and reaction to medications. Opportunity for questions was given and appropriate answers were provided. Patient/patient representative has verbalized understanding  is amenable to undergoing the procedure.  D/w hospitalist team   I personally performed the service.  Management of other medical comorbidities as per primary team  Thank you for allowing [place] to participate in this patient's care. Please don't hesitate to call if any questions or concerns arise.   Annamaria Helling, DO [facility] Gastroenterology  Portions of the record may have been created with voice recognition software. Occasional wrong-word or 'sound-a-like' substitutions may have occurred due to the inherent limitations of voice recognition software.  Read the chart carefully and recognize, using context, where substitutions may have occurred.

## 2022-03-10 NOTE — Assessment & Plan Note (Addendum)
Melena and occasional bright red bleeding per rectum, on eliquis Serial H/H stable was typed and screened, did not require transfusion IV Protonix drip --> po PPI at home Eliquis will be held off --> per GI may restart this tomorrow 03/12/2022 Upper/lower endoscopy 03/11/2022: no active bleeding, (+)diverticulosis and hemorrhoids, anastomosis appeared normal, gastric polyp was removed

## 2022-03-10 NOTE — H&P (Addendum)
[place]   PATIENT NAME: Danielle [name]    MR#:  [id number]  DATE OF BIRTH:  27-Dec-1955  DATE OF ADMISSION:  03/09/2022  PRIMARY CARE PHYSICIAN: [name], PA   Patient is coming from: Home  REQUESTING/REFERRING PHYSICIAN: Lurline Hare,  MD  CHIEF COMPLAINT:   Chief Complaint  Patient presents with   Rectal Bleeding    HISTORY OF PRESENT ILLNESS:  Danielle [name] is a 66 y.o. African-American female with medical history significant for anemia, anxiety, chronic diastolic CHF, stage IV chronic kidney disease, COPD, depression, atrial fibrillation on Eliquis and breast cancer, presents to the ER with acute onset melena which has been intermittent over the last couple months since she has ileostomy reversal, with bright red bleeding per rectum since yesterday.  She denies any nausea or vomiting.  Pain.  She admits to watery diarrhea.  No dysuria, oliguria or hematuria or flank pain.  No chest pain or dyspnea or palpitations or cough or wheezing.  No other bleeding diathesis.  ED Course: Upon presentation to the ER, vital signs were within normal.  Labs revealed mild hyponatremia and hypochloremia with a BUN of 42 and creatinine 2.42 close to baseline with stage IV CKD, with otherwise unremarkable CMP.  CBC showed hemoglobin of 10.7 and hematocrit 32.7 which is comparable to 11 days ago. INR and PTT were normal.   EKG as reviewed by me : EKG showed no sinus rhythm with a rate of 67 with first-degree AV block and T wave inversion laterally with prolonged QT interval with QTc of 541 MS. Imaging: Portable chest ray showed cardiomegaly and small right-sided pleural effusion.  Patient was given 1 L bolus of IV regular insulin IV Protonix bolus and drip.  To be admitted to a medical telemetry bed for further evaluation and management. PAST MEDICAL HISTORY:   Past Medical History:  Diagnosis Date   Anemia    Anxiety    Aortic atherosclerosis ([place])    Aortic valve stenosis  02/10/2018   a.) TTE 02/10/2018: EF 55-60%: mild AS with MPG of 12 mmHg. b.) TTE 04/21/2018: EF 35-40%; mild AS with MPG 8 mmHg. c.) TTE 11/07/2019: EF 50-55%; mild AS with MPG 10 mmHg.   Arthritis    Atrial fibrillation (HCC)    a.) CHA2DS2-VASc = 4 (age, sex, HTN, aortic plaque). b.) Rate/rhythm maintained on oral carvedilol; chronically anticoagulated on dose reduced apixaban. c.)  Attempted deployment of LAA occlusive device on 01/11/2021; parameters failed and procedure aborted.   Breast cancer, left ([place]) 2000   a.) T2N1M0; ER/PR (+) --> Tx'd with total mastectomy, LN resection, XRT, and chemotherapy   Cancer of right lung ([place]) 07/30/2016   a.) adenocarcinoma; ALK, ROS1, PDL1, BRAF, EGFR all negative.   Chronic diastolic CHF (congestive heart failure) ([place]) 11/26/2021   CKD (chronic kidney disease), stage IV (HCC)    COPD (chronic obstructive pulmonary disease) (HCC)    Dependence on supplemental oxygen    Depression    Diastolic dysfunction 03/50/0938   a.) TTE 02/10/2017: EF 55-60%; G2DD. b.) TTE 04/21/2018: EF 35-40%; mild LA dilation; mod MV regurgitation. c.) TTE 11/07/2019: EF 50-55%; G1DD.   DOE (dyspnea on exertion)    GERD (gastroesophageal reflux disease)    Heart murmur    History of 2019 novel coronavirus disease (COVID-19) 10/14/2019   HLD (hyperlipidemia)    Hypertension    Long term current use of anticoagulant    a.) apixaban   Lymphedema  Personal history of chemotherapy    Personal history of radiation therapy    SBO (small bowel obstruction) ([place]) 11/05/2020   Vitamin D deficiency     PAST SURGICAL HISTORY:   Past Surgical History:  Procedure Laterality Date   Breast Biospy Left    [facility]   BREAST SURGERY     CARDIOVERSION N/A 02/23/2022   Procedure: CARDIOVERSION;  Surgeon: Corey Skains, MD;  Location: [facility] ORS;  Service: Cardiovascular;  Laterality: N/A;   COLONOSCOPY N/A 04/30/2018   Procedure: COLONOSCOPY;  Surgeon: Virgel Manifold, MD;   Location: [facility] ENDOSCOPY;  Service: Endoscopy;  Laterality: N/A;   COLONOSCOPY N/A 07/22/2018   Procedure: COLONOSCOPY;  Surgeon: Virgel Manifold, MD;  Location: [facility] ENDOSCOPY;  Service: Endoscopy;  Laterality: N/A;   COLONOSCOPY WITH PROPOFOL N/A 09/21/2021   Procedure: COLONOSCOPY WITH PROPOFOL;  Surgeon: Benjamine Sprague, DO;  Location: [facility] ENDOSCOPY;  Service: General;  Laterality: N/A;   DILATION AND CURETTAGE OF UTERUS     ELECTROMAGNETIC NAVIGATION BROCHOSCOPY Right 04/11/2016   Procedure: ELECTROMAGNETIC NAVIGATION BRONCHOSCOPY;  Surgeon: Vilinda Boehringer, MD;  Location: [facility] ORS;  Service: Cardiopulmonary;  Laterality: Right;   ESOPHAGOGASTRODUODENOSCOPY N/A 07/22/2018   Procedure: ESOPHAGOGASTRODUODENOSCOPY (EGD);  Surgeon: Virgel Manifold, MD;  Location[facility] ENDOSCOPY;  Service: Endoscopy;  Laterality: N/A;   ESOPHAGOGASTRODUODENOSCOPY (EGD) WITH PROPOFOL N/A 05/07/2018   Procedure: ESOPHAGOGASTRODUODENOSCOPY (EGD) WITH PROPOFOL;  Surgeon: Lucilla Lame, MD;  Location: [facility] ENDOSCOPY;  Service: Endoscopy;  Laterality: N/A;   ESOPHAGOGASTRODUODENOSCOPY (EGD) WITH PROPOFOL N/A 04/24/2019   Procedure: ESOPHAGOGASTRODUODENOSCOPY (EGD) WITH PROPOFOL;  Surgeon: Jonathon Bellows, MD;  Location: [facility] ENDOSCOPY;  Service: Gastroenterology;  Laterality: N/A;   ESOPHAGOGASTRODUODENOSCOPY (EGD) WITH PROPOFOL N/A 01/12/2020   Procedure: ESOPHAGOGASTRODUODENOSCOPY (EGD) WITH PROPOFOL;  Surgeon: Jonathon Bellows, MD;  Location: [facility] ENDOSCOPY;  Service: Gastroenterology;  Laterality: N/A;   ESOPHAGOGASTRODUODENOSCOPY (EGD) WITH PROPOFOL N/A 04/28/2020   Procedure: ESOPHAGOGASTRODUODENOSCOPY (EGD) WITH PROPOFOL;  Surgeon: Jonathon Bellows, MD;  Location: [facility] ENDOSCOPY;  Service: Gastroenterology;  Laterality: N/A;   EUS N/A 05/07/2019   Procedure: FULL UPPER ENDOSCOPIC ULTRASOUND (EUS) RADIAL;  Surgeon: Jola Schmidt, MD;  Location: [facility] ENDOSCOPY;  Service: Endoscopy;  Laterality: N/A;   ILEOSCOPY N/A  07/22/2018   Procedure: ILEOSCOPY THROUGH STOMA;  Surgeon: Virgel Manifold, MD;  Location: [facility] ENDOSCOPY;  Service: Endoscopy;  Laterality: N/A;   ILEOSTOMY     ILEOSTOMY N/A 09/08/2018   Procedure: ILEOSTOMY REVISION POSSIBLE CREATION;  Surgeon: Herbert Pun, MD;  Location: [facility] ORS;  Service: General;  Laterality: N/A;   ILEOSTOMY CLOSURE N/A 08/15/2018   Procedure: DILATION OF ILEOSTOMY STRICTURE;  Surgeon: Herbert Pun, MD;  Location: [facility] ORS;  Service: General;  Laterality: N/A;   LAPAROTOMY Right 05/04/2018   Procedure: EXPLORATORY LAPAROTOMY right colectomy right and left ostomy;  Surgeon: Herbert Pun, MD;  Location: [facility] ORS;  Service: General;  Laterality: Right;   LEFT ATRIAL APPENDAGE OCCLUSION N/A 01/11/2021   Procedure: LEFT ATRIAL APPENDAGE OCCLUSION ([place]); ABORTED PROCEDURE WITHOUT DEVICE BEING IMPLANTED; Location: [facility]; Surgeon: Mylinda Latina, MD   LUNG BIOPSY     MASTECTOMY Left    2000, [place]   ROTATOR CUFF REPAIR Right    [place] TAKEDOWN N/A 10/23/2021   Procedure: XI ROBOTIC ASSISTED ILEOSTOMY TAKEDOWN;  Surgeon: Herbert Pun, MD;  Location: [facility] ORS;  Service: General;  Laterality: N/A;  180 minutes for the surgery part please    SOCIAL HISTORY:   Social History   Tobacco Use  Smoking status: Former    Packs/day: 0.50    Years: 20.00    Pack years: 10.00    Types: Cigarettes    Quit date: 07/02/2012    Years since quitting: 9.6   Smokeless tobacco: Current    Types: Snuff   Tobacco comments:    quit 2014  Substance Use Topics   Alcohol use: Yes    Comment: Occasionally beer    FAMILY HISTORY:   Family History  Problem Relation Age of Onset   Breast cancer Mother 41   Cancer Mother        Breast    Cirrhosis Father    Breast cancer Paternal Aunt 62   Cancer Maternal Aunt        Breast     DRUG ALLERGIES:  No Known Allergies  REVIEW OF SYSTEMS:   ROS As  per history of present illness. All pertinent systems were reviewed above. Constitutional, HEENT, cardiovascular, respiratory, GI, GU, musculoskeletal, neuro, psychiatric, endocrine, integumentary and hematologic systems were reviewed and are otherwise negative/unremarkable except for positive findings mentioned above in the HPI.   MEDICATIONS AT HOME:   Prior to Admission medications   Medication Sig Start Date End Date Taking? Authorizing Provider  amiodarone (PACERONE) 200 MG tablet Take 1 tablet (200 mg total) by mouth 2 (two) times daily. 11/24/21  Yes Herbert Pun, MD  apixaban (ELIQUIS) 2.5 MG TABS tablet Take 2.5 mg by mouth 2 (two) times daily. 01/11/21  Yes [provider]  atorvastatin (LIPITOR) 40 MG tablet Take 1 tablet (40 mg total) by mouth daily. 02/03/22  Yes Nicole Kindred A, DO  citalopram (CELEXA) 20 MG tablet Take 1 tablet (20 mg total) by mouth daily. 02/04/22  Yes Nicole Kindred A, DO  Fluticasone-Umeclidin-Vilant (TRELEGY ELLIPTA) 100-62.5-25 MCG/ACT AEPB Inhale 1 puff into the lungs daily. 03/05/22  Yes [provider]  metoprolol tartrate (LOPRESSOR) 25 MG tablet Take 1 tablet (25 mg total) by mouth 2 (two) times daily. 02/03/22  Yes Ezekiel Slocumb, DO  Multiple Vitamin (MULTIVITAMIN WITH MINERALS) TABS tablet Take 1 tablet by mouth daily. 02/04/22  Yes Nicole Kindred A, DO  pantoprazole (PROTONIX) 40 MG tablet Take 1 tablet (40 mg total) by mouth 2 (two) times daily before a meal. 08/05/20  Yes Pokhrel, [place], MD  sodium bicarbonate 650 MG tablet Take 1,300 mg by mouth daily.   Yes [provider]  torsemide 40 MG TABS Take 40 mg by mouth daily. 02/04/22  Yes Ezekiel Slocumb, DO  acetaminophen (TYLENOL) 325 MG tablet Take 2 tablets (650 mg total) by mouth every 6 (six) hours as needed for mild pain (or Fever >/= 101). Patient taking differently: Take 650 mg by mouth every 4 (four) hours as needed for moderate pain. 12/24/18   Gouru,  Illene Silver, MD  albuterol (PROVENTIL HFA;VENTOLIN HFA) 108 (90 Base) MCG/ACT inhaler Inhale 2 puffs into the lungs every 6 (six) hours as needed for wheezing or shortness of breath. 04/24/18   Demetrios Loll, MD  calcium carbonate (TUMS - DOSED IN MG ELEMENTAL CALCIUM) 500 MG chewable tablet Chew 1 tablet by mouth 2 (two) times daily as needed.    [provider]  midodrine (PROAMATINE) 5 MG tablet Take 1 tablet (5 mg total) by mouth 3 (three) times daily with meals. 11/24/21   Herbert Pun, MD  Nutritional Supplements (FEEDING SUPPLEMENT, NEPRO CARB STEADY,) LIQD Take 237 mLs by mouth 3 (three) times daily between meals. 02/03/22   Ezekiel Slocumb, DO  OXYGEN Inhale 2 L into the lungs as needed (shortness of breath).    [provider]  phenol (CHLORASEPTIC) 1.4 % LIQD Use as directed 1 spray in the mouth or throat as needed for throat irritation / pain. 02/03/22   Ezekiel Slocumb, DO  zolpidem (AMBIEN) 5 MG tablet Take 5 mg by mouth at bedtime as needed for sleep. 06/23/21   [provider]      VITAL SIGNS:  Blood pressure 124/74, pulse 66, temperature 98.3 F (36.8 C), temperature source Oral, resp. rate 17, height $RemoveBe'5\' 6"'meudsriLJ$  (1.676 m), weight 53.5 kg, SpO2 100 %.  PHYSICAL EXAMINATION:  Physical Exam  GENERAL:  66 y.o.-year-old African-American female patient lying in the bed with no acute distress.  EYES: Pupils equal, round, reactive to light and accommodation. No scleral icterus. Extraocular muscles intact.  HEENT: Head atraumatic, normocephalic. Oropharynx and nasopharynx clear.  NECK:  Supple, no jugular venous distention. No thyroid enlargement, no tenderness.  LUNGS: Normal breath sounds bilaterally, no wheezing, rales,rhonchi or crepitation. No use of accessory muscles of respiration.  CARDIOVASCULAR: Regular rate and rhythm, S1, S2 normal. No murmurs, rubs, or gallops.  ABDOMEN: Soft, nondistended, nontender. Bowel sounds present. No organomegaly or mass.   EXTREMITIES: No pedal edema, cyanosis, or clubbing.  NEUROLOGIC: Cranial nerves II through XII are intact. Muscle strength 5/5 in all extremities. Sensation intact. Gait not checked.  PSYCHIATRIC: The patient is alert and oriented x 3.  Normal affect and good eye contact. SKIN: No obvious rash, lesion, or ulcer.   LABORATORY PANEL:   CBC Recent Labs  Lab 03/09/22 2335  WBC 7.0  HGB 10.7*  HCT 32.7*  PLT 254   ------------------------------------------------------------------------------------------------------------------  Chemistries  Recent Labs  Lab 03/09/22 2335  NA 133*  K 4.0  CL 97*  CO2 25  GLUCOSE 73  BUN 42*  CREATININE 2.42*  CALCIUM 9.1  AST 32  ALT 17  ALKPHOS 77  BILITOT 0.5   ------------------------------------------------------------------------------------------------------------------  Cardiac Enzymes No results for input(s): TROPONINI in the last 168 hours. ------------------------------------------------------------------------------------------------------------------  RADIOLOGY:  DG Chest Port 1 View  Result Date: 03/10/2022 CLINICAL DATA:  Bloody stool EXAM: PORTABLE CHEST 1 VIEW COMPARISON:  02/24/2012, CT 02/21/2022, chest x-ray 02/01/2022 FINDINGS: At least small right-sided pleural effusion. Cardiomegaly with aortic atherosclerosis. Emphysema. Irregular right upper lobe opacity without significant change. IMPRESSION: 1. Cardiomegaly. 2. At least small right-sided pleural effusion, appears increased compared to radiograph 02/24/2012. 3. Similar irregular right upper lobe lung opacity Electronically Signed   By: Donavan Foil M.D.   On: 03/10/2022 00:01      IMPRESSION AND PLAN:  Assessment and Plan: * GI bleeding -The patient will be admitted to a medical telemetry bed. - Bleeding is manifested by melena and occasional bright red bleeding per rectum. - We will follow serial H&H. - She was typed and screened. - We will continue IV  Protonix drip. - Eliquis will be held off. - GI consult will be obtained. -I notified Dr. Virgina Jock about the patient.  Paroxysmal atrial fibrillation (HCC) - We will continue amiodarone. - Eliquis is held off.   Chronic obstructive pulmonary disease (COPD) (HCC) - We will continue her albuterol and Trelegy.  Essential hypertension - We will continue her Lopressor.  Dyslipidemia - We will continue statin therapy.   DVT prophylaxis: SCDs.  Medical prophylaxis contraindicated due to GI bleeding.   Advanced Care Planning:  Code Status: full code. Family Communication:  The plan of care was discussed in details  with the patient (and family). I answered all questions. The patient agreed to proceed with the above mentioned plan. Further management will depend upon hospital course. Disposition Plan: Back to previous home environment Consults called: Gastroenterology. All the records are reviewed and case discussed with ED provider.  Status is: Inpatient  At the time of the admission, it appears that the appropriate admission status for this patient is inpatient.  This is judged to be reasonable and necessary in order to provide the required intensity of service to ensure the patient's safety given the presenting symptoms, physical exam findings and initial radiographic and laboratory data in the context of comorbid conditions.  The patient requires inpatient status due to high intensity of service, high risk of further deterioration and high frequency of surveillance required.  I certify that at the time of admission, it is my clinical judgment that the patient will require inpatient hospital care extending more than 2 midnights.                            Dispo: The patient is from: Home              Anticipated d/c is to: Home              Patient currently is not medically stable to d/c.              Difficult to place patient: No  Christel Mormon M.D on 03/10/2022 at 4:16 AM  [facility]   From 7 PM-7 AM, contact night-coverage [email or web address]  CC: Primary care physician; [name], [place]

## 2022-03-10 NOTE — Progress Notes (Signed)
Brief note, pt admitted earlier today, see H&P  S: c/o nosebleed, feeling weak, no other concerns right now.   O: resting comfortably, H S1S2 RRR (+)murmur, L CTABL, Abd BSWNLx4 nondenter, alert  A/P:  Anemia w/ GI bleed and epistaxis  on Eliquis at home Hgb 10.7-->8.9 held Eliquis, cardiac exam sounds like sinus rhythm now GI consulted, appreciate recs  NPO pt is on SCD Afrin prn epistaxis but also apply pressure to nose and ice to bridge of nose

## 2022-03-11 ENCOUNTER — Encounter
Admission: EM | Disposition: A | Discharge status: Home / Self Care | Payer: Self-pay | Source: Home / Self Care | Attending: Family Medicine

## 2022-03-11 ENCOUNTER — Inpatient Hospital Stay: Payer: Medicare Other | Admitting: Anesthesiology

## 2022-03-11 ENCOUNTER — Encounter: Payer: Self-pay | Admitting: Family Medicine

## 2022-03-11 DIAGNOSIS — D649 Anemia, unspecified: Secondary | ICD-10-CM

## 2022-03-11 HISTORY — PX: ESOPHAGOGASTRODUODENOSCOPY (EGD) WITH PROPOFOL: SHX5813

## 2022-03-11 HISTORY — PX: COLONOSCOPY WITH PROPOFOL: SHX5780

## 2022-03-11 LAB — CBC
HCT: 26.3 % — ABNORMAL LOW (ref 36.0–46.0)
Hemoglobin: 8.6 g/dL — ABNORMAL LOW (ref 12.0–15.0)
MCH: 30.7 pg (ref 26.0–34.0)
MCHC: 32.7 g/dL (ref 30.0–36.0)
MCV: 93.9 fL (ref 80.0–100.0)
Platelets: 185 10*3/uL (ref 150–400)
RBC: 2.8 MIL/uL — ABNORMAL LOW (ref 3.87–5.11)
RDW: 13.6 % (ref 11.5–15.5)
WBC: 5.9 10*3/uL (ref 4.0–10.5)
nRBC: 0 % (ref 0.0–0.2)

## 2022-03-11 LAB — BASIC METABOLIC PANEL
Anion gap: 9 (ref 5–15)
BUN: 29 mg/dL — ABNORMAL HIGH (ref 8–23)
CO2: 24 mmol/L (ref 22–32)
Calcium: 8.5 mg/dL — ABNORMAL LOW (ref 8.9–10.3)
Chloride: 102 mmol/L (ref 98–111)
Creatinine, Ser: 1.54 mg/dL — ABNORMAL HIGH (ref 0.44–1.00)
GFR, Estimated: 37 mL/min — ABNORMAL LOW (ref >60)
Glucose, Bld: 74 mg/dL (ref 70–99)
Potassium: 3.6 mmol/L (ref 3.5–5.1)
Sodium: 135 mmol/L (ref 135–145)

## 2022-03-11 LAB — HEMOGLOBIN AND HEMATOCRIT, BLOOD
HCT: 25.3 % — ABNORMAL LOW (ref 36.0–46.0)
Hemoglobin: 8.1 g/dL — ABNORMAL LOW (ref 12.0–15.0)

## 2022-03-11 LAB — PROTIME-INR
INR: 1.2 (ref 0.8–1.2)
Prothrombin Time: 15.3 seconds — ABNORMAL HIGH (ref 11.4–15.2)

## 2022-03-11 SURGERY — ESOPHAGOGASTRODUODENOSCOPY (EGD) WITH PROPOFOL
Anesthesia: General

## 2022-03-11 MED ORDER — PROPOFOL 10 MG/ML IV BOLUS
INTRAVENOUS | Status: DC | PRN
  Administered 2022-03-11: 50 mg via INTRAVENOUS

## 2022-03-11 MED ORDER — PROPOFOL 500 MG/50ML IV EMUL
INTRAVENOUS | Status: DC | PRN
  Administered 2022-03-11: 120 ug/kg/min via INTRAVENOUS

## 2022-03-11 MED ORDER — LIDOCAINE HCL (CARDIAC) PF 100 MG/5ML IV SOSY
PREFILLED_SYRINGE | INTRAVENOUS | Status: DC | PRN
  Administered 2022-03-11: 30 mg via INTRAVENOUS

## 2022-03-11 NOTE — Plan of Care (Signed)
  Problem: Education: Goal: Knowledge of General Education information will improve Description Including pain rating scale, medication(s)/side effects and non-pharmacologic comfort measures Outcome: Progressing   Problem: Health Behavior/Discharge Planning: Goal: Ability to manage health-related needs will improve Outcome: Progressing   Problem: Clinical Measurements: Goal: Ability to maintain clinical measurements within normal limits will improve Outcome: Progressing Goal: Will remain free from infection Outcome: Progressing Goal: Diagnostic test results will improve Outcome: Progressing Goal: Respiratory complications will improve Outcome: Progressing Goal: Cardiovascular complication will be avoided Outcome: Progressing   Problem: Activity: Goal: Risk for activity intolerance will decrease Outcome: Progressing   Problem: Nutrition: Goal: Adequate nutrition will be maintained Outcome: Progressing   Problem: Coping: Goal: Level of anxiety will decrease Outcome: Progressing   Problem: Elimination: Goal: Will not experience complications related to bowel motility Outcome: Progressing Goal: Will not experience complications related to urinary retention Outcome: Progressing   Problem: Pain Managment: Goal: General experience of comfort will improve Outcome: Progressing   Problem: Safety: Goal: Ability to remain free from injury will improve Outcome: Progressing   Problem: Skin Integrity: Goal: Risk for impaired skin integrity will decrease Outcome: Progressing   Problem: Education: Goal: Ability to identify signs and symptoms of gastrointestinal bleeding will improve Outcome: Progressing   Problem: Bowel/Gastric: Goal: Will show no signs and symptoms of gastrointestinal bleeding Outcome: Progressing   Problem: Fluid Volume: Goal: Will show no signs and symptoms of excessive bleeding Outcome: Progressing   Problem: Clinical Measurements: Goal:  Complications related to the disease process, condition or treatment will be avoided or minimized Outcome: Progressing   

## 2022-03-11 NOTE — Anesthesia Procedure Notes (Signed)
Date/Time: 03/11/2022 3:45 PM Performed by: Johnna Acosta, CRNA Pre-anesthesia Checklist: Patient identified, Emergency Drugs available, Suction available, Patient being monitored and Timeout performed Patient Re-evaluated:Patient Re-evaluated prior to induction Oxygen Delivery Method: Nasal cannula Preoxygenation: Pre-oxygenation with 100% oxygen Induction Type: IV induction

## 2022-03-11 NOTE — Assessment & Plan Note (Signed)
Chronic, likely related to CKD Consider risk/benefit long term Eliquis to address w/ PCP / cardiology outpatient

## 2022-03-11 NOTE — Progress Notes (Signed)
Inpatient Follow-up/Progress Note   Patient ID: Danielle [name] is a 66 y.o. female.  Overnight Events / Subjective Findings NAEON. Completed prep. Awaiting egd and colonoscopy. NPO since midnight. Hgb stable o/n. No abdominal pain. No other acute gi complaints.  Review of Systems  Constitutional:  Negative for activity change, appetite change, chills, diaphoresis, fatigue, fever and unexpected weight change.  HENT:  Negative for trouble swallowing and voice change.   Respiratory:  Negative for shortness of breath and wheezing.   Cardiovascular:  Negative for chest pain, palpitations and leg swelling.  Gastrointestinal:  Positive for anal bleeding and blood in stool. Negative for abdominal distention, abdominal pain, constipation, diarrhea, nausea, rectal pain and vomiting.  Musculoskeletal:  Negative for arthralgias and myalgias.  Skin:  Negative for color change and pallor.  Neurological:  Negative for dizziness, syncope and weakness.  Psychiatric/Behavioral:  Negative for confusion.   All other systems reviewed and are negative.   Medications  Current Facility-Administered Medications:    0.9 %  sodium chloride infusion, , Intravenous, Continuous, Emeterio Reeve, DO, Last Rate: 75 mL/hr at 03/11/22 1109, New Bag at 03/11/22 1109   acetaminophen (TYLENOL) tablet 650 mg, 650 mg, Oral, Q6H PRN, 650 mg at 03/10/22 1700 **OR** acetaminophen (TYLENOL) suppository 650 mg, 650 mg, Rectal, Q6H PRN, Mansy, Jan A, MD   albuterol (PROVENTIL) (2.5 MG/3ML) 0.083% nebulizer solution 2.5 mg, 2.5 mg, Inhalation, Q6H PRN, Mansy, Jan A, MD   amiodarone (PACERONE) tablet 200 mg, 200 mg, Oral, BID, Mansy, Jan A, MD, 200 mg at 03/11/22 2353   atorvastatin (LIPITOR) tablet 40 mg, 40 mg, Oral, Daily, Mansy, Jan A, MD, 40 mg at 03/10/22 1026   calcium carbonate (TUMS - dosed in mg elemental calcium) chewable tablet 200 mg of elemental calcium, 1 tablet, Oral, BID PRN, Mansy, Jan A, MD   citalopram  (CELEXA) tablet 20 mg, 20 mg, Oral, Daily, Mansy, Jan A, MD, 20 mg at 03/10/22 1026   metoprolol tartrate (LOPRESSOR) tablet 25 mg, 25 mg, Oral, BID, Mansy, Jan A, MD, 25 mg at 03/11/22 0933   midodrine (PROAMATINE) tablet 5 mg, 5 mg, Oral, TID WC, Mansy, Jan A, MD, 5 mg at 03/11/22 6144   multivitamin with minerals tablet 1 tablet, 1 tablet, Oral, Daily, Mansy, Jan A, MD, 1 tablet at 03/10/22 1026   ondansetron (ZOFRAN) tablet 4 mg, 4 mg, Oral, Q6H PRN **OR** ondansetron (ZOFRAN) injection 4 mg, 4 mg, Intravenous, Q6H PRN, Mansy, Jan A, MD   oxymetazoline (AFRIN) 0.05 % nasal spray 1 spray, 1 spray, Each Nare, BID PRN, Emeterio Reeve, DO, 1 spray at 03/10/22 1202   pantoprazole (PROTONIX) injection 40 mg, 40 mg, Intravenous, Q12H, Annamaria Helling, DO, 40 mg at 03/11/22 0934   polyethylene glycol-electrolytes (NuLYTELY) solution 2,000 mL, 2,000 mL, Oral, Once PRN, Annamaria Helling, DO   sodium bicarbonate tablet 1,300 mg, 1,300 mg, Oral, Daily, Mansy, Jan A, MD, 1,300 mg at 03/10/22 1025   traZODone (DESYREL) tablet 25 mg, 25 mg, Oral, QHS PRN, Mansy, Jan A, MD, 25 mg at 03/10/22 2130   zolpidem (AMBIEN) tablet 5 mg, 5 mg, Oral, QHS PRN, Mansy, Jan A, MD  sodium chloride 75 mL/hr at 03/11/22 1109    acetaminophen **OR** acetaminophen, albuterol, calcium carbonate, ondansetron **OR** ondansetron (ZOFRAN) IV, oxymetazoline, polyethylene glycol-electrolytes, traZODone, zolpidem   Objective    Vitals:   03/10/22 1545 03/10/22 2126 03/11/22 0351 03/11/22 0820  BP: 119/72 (!) 156/75 124/64 131/78  Pulse: 73 64 65  66  Resp: 18 16 16 18   Temp: 97.7 F (36.5 C) 97.6 F (36.4 C) 98 F (36.7 C) 98.3 F (36.8 C)  TempSrc: Oral Oral Oral Oral  SpO2: 95% 100% 100% 99%  Weight:      Height:         Physical Exam Vitals and nursing note reviewed.  Constitutional:      General: She is not in acute distress.    Appearance: She is ill-appearing (chronically). She is not  toxic-appearing or diaphoretic.     Comments: Thin appearing  HENT:     Head: Normocephalic and atraumatic.     Nose: Nose normal.     Mouth/Throat:     Mouth: Mucous membranes are moist.     Pharynx: Oropharynx is clear.  Eyes:     General: No scleral icterus.    Extraocular Movements: Extraocular movements intact.  Cardiovascular:     Rate and Rhythm: Normal rate and regular rhythm.     Heart sounds: Murmur heard.    No friction rub. No gallop.  Pulmonary:     Effort: Pulmonary effort is normal. No respiratory distress.     Breath sounds: Normal breath sounds. No wheezing, rhonchi or rales.  Abdominal:     General: Abdomen is flat. Bowel sounds are normal. There is no distension.     Palpations: Abdomen is soft.     Tenderness: There is no abdominal tenderness. There is no guarding or rebound.  Musculoskeletal:     Cervical back: Neck supple.     Right lower leg: No edema.     Left lower leg: No edema.  Skin:    General: Skin is warm and dry.     Coloration: Skin is not jaundiced or pale.  Neurological:     General: No focal deficit present.     Mental Status: She is alert and oriented to person, place, and time. Mental status is at baseline.  Psychiatric:        Mood and Affect: Mood normal.        Behavior: Behavior normal.        Thought Content: Thought content normal.        Judgment: Judgment normal.     Laboratory Data Recent Labs  Lab 03/09/22 2335 03/10/22 0601 03/10/22 0755 03/10/22 1406 03/11/22 0456  WBC 7.0 6.4  --   --  5.9  HGB 10.7* 9.3* 8.9* 8.7* 8.6*  HCT 32.7* 28.4* 27.0* 26.9* 26.3*  PLT 254 215  --   --  185  NEUTOPHILPCT 72  --   --   --   --   LYMPHOPCT 20  --   --   --   --   MONOPCT 6  --   --   --   --   EOSPCT 1  --   --   --   --    Recent Labs  Lab 03/09/22 2335 03/10/22 0601 03/11/22 0456  NA 133* 135 135  K 4.0 3.6 3.6  CL 97* 98 102  CO2 25 26 24   BUN 42* 42* 29*  CREATININE 2.42* 2.01* 1.54*  CALCIUM 9.1 8.9 8.5*   PROT 8.0  --   --   BILITOT 0.5  --   --   ALKPHOS 77  --   --   ALT 17  --   --   AST 32  --   --   GLUCOSE 73 87 74   Recent Labs  Lab 03/09/22 2335 03/11/22 0456  INR 1.2 1.2      Imaging Studies: DG Chest Port 1 View  Result Date: 03/10/2022 CLINICAL DATA:  Bloody stool EXAM: PORTABLE CHEST 1 VIEW COMPARISON:  02/24/2012, CT 02/21/2022, chest x-ray 02/01/2022 FINDINGS: At least small right-sided pleural effusion. Cardiomegaly with aortic atherosclerosis. Emphysema. Irregular right upper lobe opacity without significant change. IMPRESSION: 1. Cardiomegaly. 2. At least small right-sided pleural effusion, appears increased compared to radiograph 02/24/2012. 3. Similar irregular right upper lobe lung opacity Electronically Signed   By: Donavan Foil M.D.   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  Transfusion and resuscitation as per primary team Avoid frequent lab draws to prevent lab induced anemia Supportive care and antiemetics as per primary team Maintain two sites IV access Avoid nsaids Monitor for GIB.  See endoscopy report post procedure for further recommendations   Esophagogastroduodenoscopy and Colonoscopy with possible biopsy, control of bleeding, polypectomy, and interventions as necessary has been discussed with the patient/patient representative. Informed consent was obtained from the patient/patient representative after explaining the indication, nature, and risks of the  procedure including but not limited to death, bleeding, perforation, missed neoplasm/lesions, cardiorespiratory compromise, and reaction to medications. Opportunity for questions was given and appropriate answers were provided. Patient/patient representative has verbalized understanding is amenable to undergoing the procedure.  I personally performed the service.  Management of other medical comorbidities as per primary team  Thank you for allowing [place] to participate in this patient's care. Please don't hesitate to call if any questions or concerns arise.   Annamaria Helling, DO [facility]  Portions of the record may have been created with voice recognition software. Occasional wrong-word or 'sound-a-like' substitutions may have occurred due to the inherent limitations of voice recognition software.  Read the chart carefully and recognize, using context, where substitutions may have occurred.

## 2022-03-11 NOTE — Op Note (Signed)
[facility] Patient Name: Danielle [name] Procedure Date: 03/11/2022 3:35 PM MRN: [id number] Account #: [id number] Date of Birth: 1956-06-11 Admit Type: Inpatient Age: 66 Room: [facility] ENDO ROOM 4 Gender: Female Note Status: Finalized Instrument Name: Peds Colonoscope [id number] Procedure:             Colonoscopy Indications:           Hematochezia, Melena Providers:             Annamaria Helling DO, DO Medicines:             Monitored Anesthesia Care Complications:         No immediate complications. Estimated blood loss: None. Procedure:             Pre-Anesthesia Assessment:                        - Prior to the procedure, a History and Physical was                         performed, and patient medications and allergies were                         reviewed. The patient is competent. The risks and                         benefits of the procedure and the sedation options and                         risks were discussed with the patient. All questions                         were answered and informed consent was obtained.                         Patient identification and proposed procedure were                         verified by the physician, the nurse, the anesthetist                         and the technician in the endoscopy suite. Mental                         Status Examination: alert and oriented. Airway                         Examination: normal oropharyngeal airway and neck                         mobility. Respiratory Examination: clear to                         auscultation. CV Examination: RRR, no murmurs, no S3                         or S4. Prophylactic Antibiotics: The patient does not                         require prophylactic  antibiotics. Prior                         Anticoagulants: The patient has taken Eliquis                         (apixaban), last dose was 2 days prior to procedure.                         ASA Grade  Assessment: III - A patient with severe                         systemic disease. After reviewing the risks and                         benefits, the patient was deemed in satisfactory                         condition to undergo the procedure. The anesthesia                         plan was to use monitored anesthesia care (MAC).                         Immediately prior to administration of medications,                         the patient was re-assessed for adequacy to receive                         sedatives. The heart rate, respiratory rate, oxygen                         saturations, blood pressure, adequacy of pulmonary                         ventilation, and response to care were monitored                         throughout the procedure. The physical status of the                         patient was re-assessed after the procedure.                        After obtaining informed consent, the colonoscope was                         passed under direct vision. Throughout the procedure,                         the patient's blood pressure, pulse, and oxygen                         saturations were monitored continuously. The                         Colonoscope was introduced through the anus and  advanced to the the ileocolonic anastomosis. The                         colonoscopy was performed without difficulty. The                         patient tolerated the procedure well. The quality of                         the bowel preparation was good. The rectum and                         ileocolonic anastamosis were photographed. Findings:      The neo-terminal ileum appeared normal. Estimated blood loss: none.      There was evidence of a prior side-to-side ileo-colonic anastomosis at       50 cm proximal to the anus. This was patent and was characterized by       healthy appearing mucosa and an intact staple line. No ulcerat. normal       blind pouch. The  anastomosis was traversed. Estimated blood loss: none.      Normal mucosa was found in the entire remaining colon. Estimated blood       loss: none. No signs of fresh or old blood.      Non-bleeding internal hemorrhoids were found during retroflexion. The       hemorrhoids were Grade II (internal hemorrhoids that prolapse but reduce       spontaneously). Estimated blood loss: none.      A few small-mouthed diverticula were found in the left colon. Estimated       blood loss: none.      The exam was otherwise without abnormality on direct and retroflexion       views. Impression:            - The examined portion of the ileum was normal.                        - Patent side-to-side ileo-colonic anastomosis,                         characterized by healthy appearing mucosa and an                         intact staple line. No ulcerat. normal blind pou.                        - Normal mucosa in the entire examined colon.                        - Non-bleeding internal hemorrhoids.                        - Diverticulosis in the left colon.                        - The examination was otherwise normal on direct and                         retroflexion views.                        -  No specimens collected. Recommendation:        - Return patient to hospital ward for ongoing care.                        - Resume regular diet.                        - Continue present medications.                        - Resume Eliquis (apixaban) at prior dose tomorrow.                         Refer to referring physician for further adjustment of                         therapy.                        - Repeat colonoscopy resume regular screening interval.                        - The findings and recommendations were discussed with                         the patient.                        - GI to sign off. Procedure Code(s):     --- Professional ---                        [phone], Colonoscopy, flexible;  diagnostic, including                         collection of specimen(s) by brushing or washing, when                         performed (separate procedure) Diagnosis Code(s):     --- Professional ---                        K64.1, Second degree hemorrhoids                        K92.1, Melena (includes Hematochezia) CPT copyright 2019 [employer]. All rights reserved. The codes documented in this report are preliminary and upon coder review may  be revised to meet current compliance requirements. Attending Participation:      I personally performed the entire procedure. Volney American, DO Annamaria Helling DO, DO 03/11/2022 4:24:31 PM This report has been signed electronically. Number of Addenda: 0 Note Initiated On: 03/11/2022 3:35 PM Scope Withdrawal Time: 0 hours 3 minutes 3 seconds  Total Procedure Duration: 0 hours 8 minutes 40 seconds  Estimated Blood Loss:  Estimated blood loss: none.      [facility]

## 2022-03-11 NOTE — Care Management (Signed)
Pre-rounds chart review and preliminary plan / goals for today: Significant overnight events per chart: none Significant new findings on labs/other diagnostics: H/H stable post transfusion Dispo plan: home when able Pending/Plan: GI plan for EGD colonoscopy today, pending those results may be able to discharge later today if no concerns from GI and post-procedure H/H remains stable - appreciate GI recs   Please feel free to reach out via secure chat in Epic for non-urgent issues. Please page for urgent matters!  This note will be updated after rounds to full progress note / discharge note as appropriate.

## 2022-03-11 NOTE — Discharge Summary (Signed)
Physician Discharge Summary   Patient: Danielle [name] MRN: [id number] DOB: 02/19/1956  Admit date:     03/09/2022  Discharge date:   Discharge Physician: Emeterio Reeve   PCP: [name], PA   Recommendations at discharge:  [place] 03/12/2022.  Follow up this week with PCP to monitor blood counts  Pending results: gastric polyp pathology   Discharge Diagnoses: Principal Problem:   GI bleeding Active Problems:   Paroxysmal atrial fibrillation (HCC)   Normocytic anemia   Dyslipidemia   Essential hypertension   Chronic obstructive pulmonary disease (COPD) [facility])    Hospital Course: Danielle [name] is a 66 y.o. African-American female with medical history significant for anemia, anxiety, chronic diastolic CHF, stage IV chronic kidney disease, COPD, depression, atrial fibrillation on Eliquis and breast cancer, presents to the ER 03/10/2022 with acute onset melena  which has been intermittent over the last couple months since she has ileostomy reversal, with bright red bleeding per rectum since 05/26, occasional watery diarrhea. Hgb 10.7, Cr at baseline. Started on protonix, received 1L IVF. 03/11/2022 upper and lower endoscopy no active bleeding.   Assessment and Plan: * GI bleeding Melena and occasional bright red bleeding per rectum, on eliquis Serial H/H stable was typed and screened, did not require transfusion IV Protonix drip --> po PPI at home Eliquis will be held off --> per GI may restart this tomorrow 03/12/2022 Upper/lower endoscopy 03/11/2022: no active bleeding, (+)diverticulosis and hemorrhoids, anastomosis appeared normal, gastric polyp was removed  Paroxysmal atrial fibrillation (HCC) Continue home amiodarone. Resume Eliquis 03/12/2022   Chronic obstructive pulmonary disease (COPD) ([place]) Continued home albuterol and Trelegy.  Essential hypertension Continue home Lopressor.  Dyslipidemia Continue home statin therapy.  Normocytic  anemia Chronic, likely related to CKD Consider risk/benefit long term Eliquis to address w/ PCP / cardiology outpatient        Consultants: Gastroenterology Procedures performed: Upper/lower endoscopy 03/11/2022: no active bleeding, (+)diverticulosis and hemorrhoids, anastomosis appeared normal, gastric polyp was removed  Disposition: Home Diet recommendation:  Discharge Diet Orders (From admission, onward)     Start     Ordered   03/11/22 0000  Diet - low sodium heart healthy        03/11/22 1645           Cardiac diet DISCHARGE MEDICATION: Allergies as of 03/11/2022   No Known Allergies      Medication List     TAKE these medications    acetaminophen 325 MG tablet Commonly known as: TYLENOL Take 2 tablets (650 mg total) by mouth every 6 (six) hours as needed for mild pain (or Fever >/= 101). What changed:  when to take this reasons to take this   albuterol 108 (90 Base) MCG/ACT inhaler Commonly known as: VENTOLIN HFA Inhale 2 puffs into the lungs every 6 (six) hours as needed for wheezing or shortness of breath.   amiodarone 200 MG tablet Commonly known as: PACERONE Take 1 tablet (200 mg total) by mouth 2 (two) times daily.   apixaban 2.5 MG Tabs tablet Commonly known as: ELIQUIS Take 2.5 mg by mouth 2 (two) times daily.   atorvastatin 40 MG tablet Commonly known as: LIPITOR Take 1 tablet (40 mg total) by mouth daily.   calcium carbonate 500 MG chewable tablet Commonly known as: TUMS - dosed in mg elemental calcium Chew 1 tablet by mouth 2 (two) times daily as needed.   citalopram 20 MG tablet Commonly known as: CELEXA Take 1 tablet (20 mg  total) by mouth daily.   feeding supplement (NEPRO CARB STEADY) Liqd Take 237 mLs by mouth 3 (three) times daily between meals.   metoprolol tartrate 25 MG tablet Commonly known as: LOPRESSOR Take 1 tablet (25 mg total) by mouth 2 (two) times daily.   midodrine 5 MG tablet Commonly known as:  PROAMATINE Take 1 tablet (5 mg total) by mouth 3 (three) times daily with meals.   multivitamin with minerals Tabs tablet Take 1 tablet by mouth daily.   OXYGEN Inhale 2 L into the lungs as needed (shortness of breath).   pantoprazole 40 MG tablet Commonly known as: Protonix Take 1 tablet (40 mg total) by mouth 2 (two) times daily before a meal.   phenol 1.4 % Liqd Commonly known as: CHLORASEPTIC Use as directed 1 spray in the mouth or throat as needed for throat irritation / pain.   sodium bicarbonate 650 MG tablet Take 1,300 mg by mouth daily.   Torsemide 40 MG Tabs Take 40 mg by mouth daily.   Trelegy Ellipta 100-62.5-25 MCG/ACT Aepb Generic drug: Fluticasone-Umeclidin-Vilant Inhale 1 puff into the lungs daily.   zolpidem 5 MG tablet Commonly known as: AMBIEN Take 5 mg by mouth at bedtime as needed for sleep.        Discharge Exam: Filed Weights   03/09/22 2330 03/10/22 0508  Weight: 53.5 kg 51 kg  BP 119/73 (BP Location: Right Arm)   Pulse 63   Temp 98.2 F (36.8 C) (Oral)   Resp 19   Ht 5\' 6"  (1.676 m)   Wt 51 kg   SpO2 99%   BMI 18.16 kg/m   Constitutional:  VSS, see above General Appearance: alert, well-developed, thin, NAD Neck: No masses, trachea midline Respiratory: Normal respiratory effort Breath sounds normal, no wheeze/rhonchi/rales Cardiovascular: S1/S2 normal, Irreg/irreg, reg rate  No lower extremity edema Gastrointestinal: Nontender, no masses Musculoskeletal:  No clubbing/cyanosis of digits Neurological: No cranial nerve deficit on limited exam Psychiatric: Normal judgment/insight Normal mood and affect   Condition at discharge: good  The results of significant diagnostics from this hospitalization (including imaging, microbiology, ancillary and laboratory) are listed below for reference.   Imaging Studies: CT Chest Wo Contrast  Result Date: 02/21/2022 CLINICAL DATA:  Chest wall pain, nontraumatic, infection or  inflammation suspected, xray done Patient with history of multiple cancers. Patient has increasing pain right chest EXAM: CT CHEST WITHOUT CONTRAST TECHNIQUE: Multidetector CT imaging of the chest was performed following the standard protocol without IV contrast. RADIATION DOSE REDUCTION: This exam was performed according to the departmental dose-optimization program which includes automated exposure control, adjustment of the mA and/or kV according to patient size and/or use of iterative reconstruction technique. COMPARISON:  Radiograph earlier today. Chest CT 01/26/2022, additional prior chest CT reviewed FINDINGS: Cardiovascular: Moderately advanced aortic atherosclerosis. No aortic aneurysm. Mild cardiomegaly. There are coronary artery calcifications. Mitral annulus calcifications. Dilated central pulmonary arteries, main pulmonary artery measures 3.6 cm, right pulmonary artery measures 3.1 cm, left pulmonary artery measures 3.2 cm. There is no pericardial effusion. Mediastinum/Nodes: Decreased subcarinal node currently measuring 14 mm, previously 16 mm, series 3, image 70. Additional scattered small mediastinal lymph nodes are not enlarged by size criteria. Lack of IV contrast significantly limits assessment for hilar adenopathy. Small hiatal hernia with patulous esophagus. Lungs/Pleura: Moderate emphysema. Layering right pleural effusion with associated atelectasis, not significantly changed in appearance from prior CT. Irregular nodular opacity in the right upper lobe measuring 2.4 x 1.5 cm, series 4, image 59, not  significantly changed on my retrospective measurement. Masslike opacity in the anterior right upper lobe measures 3.7 x 2 cm, series 4, image 69, possibly smaller versus stable allowing for differences in caliper placement in slice selection. Calcified left apical pleuroparenchymal thickening is stable in related scarring. There is no acute airspace disease or new pulmonary nodule. No left pleural  effusion. Stable areas of subpleural thickening in the anterior left lung. Upper Abdomen: Stable 11 mm water density lesion in the left lobe of the liver, not metabolic in prior PET and consistent with cyst. Dense upper abdominal aortic atherosclerosis. No acute upper abdominal findings. Musculoskeletal: Stable concavity of L2 superior endplate. No focal rib lesion or rib fracture to explain right-sided pain. There are remote left anterior rib fractures. Left mastectomy. Chest wall soft tissue abnormalities. IMPRESSION: 1. Layering right pleural effusion with associated atelectasis, not significantly changed in appearance from CT last month. 2. Two areas of masslike opacity in the right upper lobe. 1 of these areas appears stable, the other may be slightly smaller or unchanged, regardless there is no progression. These may represent areas of post radiation scarring. Continued oncologic follow-up is recommended. 3. No new or acute airspace disease. 4. Decreased size of subcarinal node from prior exam. 5. Cardiomegaly with aortic atherosclerosis. Coronary artery calcifications. Aortic Atherosclerosis (ICD10-I70.0) and Emphysema (ICD10-J43.9). Electronically Signed   By: Keith Rake M.D.   On: 02/21/2022 18:04   DG Chest Port 1 View  Result Date: 03/10/2022 CLINICAL DATA:  Bloody stool EXAM: PORTABLE CHEST 1 VIEW COMPARISON:  02/24/2012, CT 02/21/2022, chest x-ray 02/01/2022 FINDINGS: At least small right-sided pleural effusion. Cardiomegaly with aortic atherosclerosis. Emphysema. Irregular right upper lobe opacity without significant change. IMPRESSION: 1. Cardiomegaly. 2. At least small right-sided pleural effusion, appears increased compared to radiograph 02/24/2012. 3. Similar irregular right upper lobe lung opacity Electronically Signed   By: Donavan Foil M.D.   On: 03/10/2022 00:01   DG Chest Port 1 View  Result Date: 02/23/2022 CLINICAL DATA:  Status post RIGHT thoracentesis. EXAM: PORTABLE CHEST 1  VIEW COMPARISON:  02/21/2022 FINDINGS: UPPER limits normal heart size again noted. Decreased RIGHT pleural effusion, now very small. There is no evidence of pneumothorax. RIGHT perihilar opacity is unchanged. RIGHT basilar atelectasis has improved. IMPRESSION: 1. Decreased RIGHT pleural effusion, now very small. No pneumothorax. 2. Unchanged RIGHT perihilar opacity. Electronically Signed   By: Margarette Canada M.D.   On: 02/23/2022 16:04   DG Chest Portable 1 View  Result Date: 02/21/2022 CLINICAL DATA:  Short of breath EXAM: PORTABLE CHEST 1 VIEW COMPARISON:  02/01/2022 FINDINGS: Progression of right lower lobe airspace disease and small right effusion. Right perihilar density with mild progression. Heart size upper normal. Negative for heart failure or edema. Atherosclerotic calcification aortic arch. Left lung clear. IMPRESSION: Progressive right lower lobe airspace disease and right pleural effusion. Progressive density overlying the right hilum. Possible pneumonia. Electronically Signed   By: Franchot Gallo M.D.   On: 02/21/2022 17:03   US THORACENTESIS ASP PLEURAL SPACE W/IMG GUIDE  Result Date: 02/23/2022 INDICATION: Patient with a history of chronic kidney disease, atrial fibrillation and lung cancer with recurrent right pleural effusion. Interventional radiology asked to perform a therapeutic thoracentesis. EXAM: ULTRASOUND GUIDED THORACENTESIS MEDICATIONS: 1% lidocaine 10 mL COMPLICATIONS: None immediate. PROCEDURE: An ultrasound guided thoracentesis was thoroughly discussed with the patient and questions answered. The benefits, risks, alternatives and complications were also discussed. The patient understands and wishes to proceed with the procedure. Written consent was obtained. Ultrasound  was performed to localize and mark an adequate pocket of fluid in the right chest. The area was then prepped and draped in the normal sterile fashion. 1% Lidocaine was used for local anesthesia. Under ultrasound  guidance a 6 Fr Safe-T-Centesis catheter was introduced. Thoracentesis was performed. The catheter was removed and a dressing applied. FINDINGS: A total of approximately 650 mL of clear yellow fluid was removed. IMPRESSION: Successful ultrasound guided right thoracentesis yielding 650 mL of pleural fluid. Read by: Soyla Dryer, NP Electronically Signed   By: Markus Daft M.D.   On: 02/23/2022 16:11    Microbiology: Results for orders placed or performed during the hospital encounter of 02/21/22  Body fluid culture w Gram Stain     Status: None   Collection Time: 02/23/22  3:45 PM   Specimen: PATH Cytology Pleural fluid  Result Value Ref Range Status   Specimen Description   Final    PLEURAL Performed at [facility], [address], [place], [place] [address]    Special Requests   Final    NONE Performed at [facility], [address] [place], [place] [address]    Gram Stain   Final    FEW WBC PRESENT, PREDOMINANTLY MONONUCLEAR NO ORGANISMS SEEN    Culture   Final    NO GROWTH Performed at [facility], [place] [address], [place], [place] [address]    Report Status 02/27/2022 FINAL  Final    Labs: CBC: Recent Labs  Lab 03/09/22 2335 03/10/22 0601 03/10/22 0755 03/10/22 1406 03/11/22 0456 03/11/22 1657  WBC 7.0 6.4  --   --  5.9  --   NEUTROABS 5.0  --   --   --   --   --   HGB 10.7* 9.3* 8.9* 8.7* 8.6* 8.1*  HCT 32.7* 28.4* 27.0* 26.9* 26.3* 25.3*  MCV 92.9 92.8  --   --  93.9  --   PLT 254 215  --   --  185  --    Basic Metabolic Panel: Recent Labs  Lab 03/09/22 2335 03/10/22 0601 03/11/22 0456  NA 133* 135 135  K 4.0 3.6 3.6  CL 97* 98 102  CO2 25 26 24   GLUCOSE 73 87 74  BUN 42* 42* 29*  CREATININE 2.42* 2.01* 1.54*  CALCIUM 9.1 8.9 8.5*   Liver Function Tests: Recent Labs  Lab 03/09/22 2335  AST 32  ALT 17  ALKPHOS 77  BILITOT 0.5  PROT 8.0  ALBUMIN 4.0   CBG: No results for input(s): GLUCAP in the last 168  hours.  Discharge time spent: greater than 30 minutes.  Signed: Emeterio Reeve, DO [facility] 03/11/2022

## 2022-03-11 NOTE — H&P (View-Only) (Signed)
Inpatient Follow-up/Progress Note   Patient ID: Danielle [name] is a 66 y.o. female.  Overnight Events / Subjective Findings NAEON. Completed prep. Awaiting egd and colonoscopy. NPO since midnight. Hgb stable o/n. No abdominal pain. No other acute gi complaints.  Review of Systems  Constitutional:  Negative for activity change, appetite change, chills, diaphoresis, fatigue, fever and unexpected weight change.  HENT:  Negative for trouble swallowing and voice change.   Respiratory:  Negative for shortness of breath and wheezing.   Cardiovascular:  Negative for chest pain, palpitations and leg swelling.  Gastrointestinal:  Positive for anal bleeding and blood in stool. Negative for abdominal distention, abdominal pain, constipation, diarrhea, nausea, rectal pain and vomiting.  Musculoskeletal:  Negative for arthralgias and myalgias.  Skin:  Negative for color change and pallor.  Neurological:  Negative for dizziness, syncope and weakness.  Psychiatric/Behavioral:  Negative for confusion.   All other systems reviewed and are negative.   Medications  Current Facility-Administered Medications:    0.9 %  sodium chloride infusion, , Intravenous, Continuous, Emeterio Reeve, DO, Last Rate: 75 mL/hr at 03/11/22 1109, New Bag at 03/11/22 1109   acetaminophen (TYLENOL) tablet 650 mg, 650 mg, Oral, Q6H PRN, 650 mg at 03/10/22 1700 **OR** acetaminophen (TYLENOL) suppository 650 mg, 650 mg, Rectal, Q6H PRN, Mansy, Jan A, MD   albuterol (PROVENTIL) (2.5 MG/3ML) 0.083% nebulizer solution 2.5 mg, 2.5 mg, Inhalation, Q6H PRN, Mansy, Jan A, MD   amiodarone (PACERONE) tablet 200 mg, 200 mg, Oral, BID, Mansy, Jan A, MD, 200 mg at 03/11/22 9211   atorvastatin (LIPITOR) tablet 40 mg, 40 mg, Oral, Daily, Mansy, Jan A, MD, 40 mg at 03/10/22 1026   calcium carbonate (TUMS - dosed in mg elemental calcium) chewable tablet 200 mg of elemental calcium, 1 tablet, Oral, BID PRN, Mansy, Jan A, MD   citalopram  (CELEXA) tablet 20 mg, 20 mg, Oral, Daily, Mansy, Jan A, MD, 20 mg at 03/10/22 1026   metoprolol tartrate (LOPRESSOR) tablet 25 mg, 25 mg, Oral, BID, Mansy, Jan A, MD, 25 mg at 03/11/22 0933   midodrine (PROAMATINE) tablet 5 mg, 5 mg, Oral, TID WC, Mansy, Jan A, MD, 5 mg at 03/11/22 9417   multivitamin with minerals tablet 1 tablet, 1 tablet, Oral, Daily, Mansy, Jan A, MD, 1 tablet at 03/10/22 1026   ondansetron (ZOFRAN) tablet 4 mg, 4 mg, Oral, Q6H PRN **OR** ondansetron (ZOFRAN) injection 4 mg, 4 mg, Intravenous, Q6H PRN, Mansy, Jan A, MD   oxymetazoline (AFRIN) 0.05 % nasal spray 1 spray, 1 spray, Each Nare, BID PRN, Emeterio Reeve, DO, 1 spray at 03/10/22 1202   pantoprazole (PROTONIX) injection 40 mg, 40 mg, Intravenous, Q12H, Annamaria Helling, DO, 40 mg at 03/11/22 0934   polyethylene glycol-electrolytes (NuLYTELY) solution 2,000 mL, 2,000 mL, Oral, Once PRN, Annamaria Helling, DO   sodium bicarbonate tablet 1,300 mg, 1,300 mg, Oral, Daily, Mansy, Jan A, MD, 1,300 mg at 03/10/22 1025   traZODone (DESYREL) tablet 25 mg, 25 mg, Oral, QHS PRN, Mansy, Jan A, MD, 25 mg at 03/10/22 2130   zolpidem (AMBIEN) tablet 5 mg, 5 mg, Oral, QHS PRN, Mansy, Jan A, MD  sodium chloride 75 mL/hr at 03/11/22 1109    acetaminophen **OR** acetaminophen, albuterol, calcium carbonate, ondansetron **OR** ondansetron (ZOFRAN) IV, oxymetazoline, polyethylene glycol-electrolytes, traZODone, zolpidem   Objective    Vitals:   03/10/22 1545 03/10/22 2126 03/11/22 0351 03/11/22 0820  BP: 119/72 (!) 156/75 124/64 131/78  Pulse: 73 64 65  66  Resp: 18 16 16 18   Temp: 97.7 F (36.5 C) 97.6 F (36.4 C) 98 F (36.7 C) 98.3 F (36.8 C)  TempSrc: Oral Oral Oral Oral  SpO2: 95% 100% 100% 99%  Weight:      Height:         Physical Exam Vitals and nursing note reviewed.  Constitutional:      General: She is not in acute distress.    Appearance: She is ill-appearing (chronically). She is not  toxic-appearing or diaphoretic.     Comments: Thin appearing  HENT:     Head: Normocephalic and atraumatic.     Nose: Nose normal.     Mouth/Throat:     Mouth: Mucous membranes are moist.     Pharynx: Oropharynx is clear.  Eyes:     General: No scleral icterus.    Extraocular Movements: Extraocular movements intact.  Cardiovascular:     Rate and Rhythm: Normal rate and regular rhythm.     Heart sounds: Murmur heard.    No friction rub. No gallop.  Pulmonary:     Effort: Pulmonary effort is normal. No respiratory distress.     Breath sounds: Normal breath sounds. No wheezing, rhonchi or rales.  Abdominal:     General: Abdomen is flat. Bowel sounds are normal. There is no distension.     Palpations: Abdomen is soft.     Tenderness: There is no abdominal tenderness. There is no guarding or rebound.  Musculoskeletal:     Cervical back: Neck supple.     Right lower leg: No edema.     Left lower leg: No edema.  Skin:    General: Skin is warm and dry.     Coloration: Skin is not jaundiced or pale.  Neurological:     General: No focal deficit present.     Mental Status: She is alert and oriented to person, place, and time. Mental status is at baseline.  Psychiatric:        Mood and Affect: Mood normal.        Behavior: Behavior normal.        Thought Content: Thought content normal.        Judgment: Judgment normal.     Laboratory Data Recent Labs  Lab 03/09/22 2335 03/10/22 0601 03/10/22 0755 03/10/22 1406 03/11/22 0456  WBC 7.0 6.4  --   --  5.9  HGB 10.7* 9.3* 8.9* 8.7* 8.6*  HCT 32.7* 28.4* 27.0* 26.9* 26.3*  PLT 254 215  --   --  185  NEUTOPHILPCT 72  --   --   --   --   LYMPHOPCT 20  --   --   --   --   MONOPCT 6  --   --   --   --   EOSPCT 1  --   --   --   --    Recent Labs  Lab 03/09/22 2335 03/10/22 0601 03/11/22 0456  NA 133* 135 135  K 4.0 3.6 3.6  CL 97* 98 102  CO2 25 26 24   BUN 42* 42* 29*  CREATININE 2.42* 2.01* 1.54*  CALCIUM 9.1 8.9 8.5*   PROT 8.0  --   --   BILITOT 0.5  --   --   ALKPHOS 77  --   --   ALT 17  --   --   AST 32  --   --   GLUCOSE 73 87 74   Recent Labs  Lab 03/09/22 2335 03/11/22 0456  INR 1.2 1.2      Imaging Studies: DG Chest Port 1 View  Result Date: 03/10/2022 CLINICAL DATA:  Bloody stool EXAM: PORTABLE CHEST 1 VIEW COMPARISON:  02/24/2012, CT 02/21/2022, chest x-ray 02/01/2022 FINDINGS: At least small right-sided pleural effusion. Cardiomegaly with aortic atherosclerosis. Emphysema. Irregular right upper lobe opacity without significant change. IMPRESSION: 1. Cardiomegaly. 2. At least small right-sided pleural effusion, appears increased compared to radiograph 02/24/2012. 3. Similar irregular right upper lobe lung opacity Electronically Signed   By: Donavan Foil M.D.   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  Transfusion and resuscitation as per primary team Avoid frequent lab draws to prevent lab induced anemia Supportive care and antiemetics as per primary team Maintain two sites IV access Avoid nsaids Monitor for GIB.  See endoscopy report post procedure for further recommendations   Esophagogastroduodenoscopy and Colonoscopy with possible biopsy, control of bleeding, polypectomy, and interventions as necessary has been discussed with the patient/patient representative. Informed consent was obtained from the patient/patient representative after explaining the indication, nature, and risks of the  procedure including but not limited to death, bleeding, perforation, missed neoplasm/lesions, cardiorespiratory compromise, and reaction to medications. Opportunity for questions was given and appropriate answers were provided. Patient/patient representative has verbalized understanding is amenable to undergoing the procedure.  I personally performed the service.  Management of other medical comorbidities as per primary team  Thank you for allowing [place] to participate in this patient's care. Please don't hesitate to call if any questions or concerns arise.   Annamaria Helling, DO [facility]  Portions of the record may have been created with voice recognition software. Occasional wrong-word or 'sound-a-like' substitutions may have occurred due to the inherent limitations of voice recognition software.  Read the chart carefully and recognize, using context, where substitutions may have occurred.

## 2022-03-11 NOTE — Anesthesia Preprocedure Evaluation (Signed)
Anesthesia Evaluation  Patient identified by MRN, date of birth, ID band Patient awake    Reviewed: Allergy & Precautions, H&P , NPO status , Patient's Chart, lab work & pertinent test results  History of Anesthesia Complications Negative for: history of anesthetic complications  Airway Mallampati: II  TM Distance: >3 FB     Dental  (+) Missing, Edentulous Upper, Dental Advidsory Given   Pulmonary shortness of breath and with exertion, COPD,  COPD inhaler and oxygen dependent, neg recent URI, former smoker,    breath sounds clear to auscultation + decreased breath sounds      Cardiovascular hypertension, (-) angina+CHF  (-) Past MI and (-) Cardiac Stents (-) dysrhythmias + Valvular Problems/Murmurs  Rhythm:regular Rate:Normal  1. EF 50 to 55%. Mild LVH. 2. Grade I diastolic dysfunction (impaired relaxation).  3. Small to moderate inferior myocardial infarction.  4. The left ventricle demonstrates regional wall motion abnormalities.  5. Normal RV 6. Moderately elevated pulmonary artery systolic pressure.    Neuro/Psych PSYCHIATRIC DISORDERS Anxiety Depression negative neurological ROS     GI/Hepatic Neg liver ROS, PUD, GERD  ,  Endo/Other  negative endocrine ROS  Renal/GU CRFRenal disease  negative genitourinary   Musculoskeletal   Abdominal   Peds  Hematology  (+) Blood dyscrasia, anemia ,   Anesthesia Other Findings Past Medical History: No date: Anxiety No date: Arthritis 2000: Breast cancer ([place]) left : Cancer ([place])     Comment:  breast cancer 2000, chemo tx's with total mastectomy and              lymph nodes resected.  02/21/2016: Cancer of right lung ([place])     Comment:  rad tx's.  No date: CHF (congestive heart failure) (HCC) No date: COPD (chronic obstructive pulmonary disease) (HCC) No date: Dependence on supplemental oxygen No date: Depression No date: Heart murmur No date: Hypertension No  date: Lung nodule No date: Lymphedema No date: Personal history of chemotherapy No date: Personal history of radiation therapy No date: Shortness of breath dyspnea     Comment:  with exertion 2001: Status post chemotherapy     Comment:  left breast cancer 2001: Status post radiation therapy     Comment:  left breast cancer  Past Surgical History: No date: Breast Biospy; Left     Comment:  [facility] No date: BREAST SURGERY 04/30/2018: COLONOSCOPY; N/A     Comment:  Procedure: COLONOSCOPY;  Surgeon: Virgel Manifold,               MD;  Location: [facility] ENDOSCOPY;  Service: Endoscopy;                Laterality: N/A; 07/22/2018: COLONOSCOPY; N/A     Comment:  Procedure: COLONOSCOPY;  Surgeon: Virgel Manifold,               MD;  Location: [facility] ENDOSCOPY;  Service: Endoscopy;                Laterality: N/A; No date: DILATION AND CURETTAGE OF UTERUS 04/11/2016: ELECTROMAGNETIC NAVIGATION BROCHOSCOPY; Right     Comment:  Procedure: ELECTROMAGNETIC NAVIGATION BRONCHOSCOPY;                Surgeon: Vilinda Boehringer, MD;  Location: [facility] ORS;                Service: Cardiopulmonary;  Laterality: Right; 07/22/2018: ESOPHAGOGASTRODUODENOSCOPY; N/A     Comment:  Procedure: ESOPHAGOGASTRODUODENOSCOPY (EGD);  Surgeon:  Virgel Manifold, MD;  Location: [facility] ENDOSCOPY;                Service: Endoscopy;  Laterality: N/A; 05/07/2018: ESOPHAGOGASTRODUODENOSCOPY (EGD) WITH PROPOFOL; N/A     Comment:  Procedure: ESOPHAGOGASTRODUODENOSCOPY (EGD) WITH               PROPOFOL;  Surgeon: Lucilla Lame, MD;  Location: [facility]               ENDOSCOPY;  Service: Endoscopy;  Laterality: N/A; 04/24/2019: ESOPHAGOGASTRODUODENOSCOPY (EGD) WITH PROPOFOL; N/A     Comment:  Procedure: ESOPHAGOGASTRODUODENOSCOPY (EGD) WITH               PROPOFOL;  Surgeon: Jonathon Bellows, MD;  Location: [facility]               ENDOSCOPY;  Service: Gastroenterology;  Laterality: N/A; 05/07/2019: EUS; N/A     Comment:  Procedure: FULL  UPPER ENDOSCOPIC ULTRASOUND (EUS)               RADIAL;  Surgeon: Jola Schmidt, MD;  Location: [facility]               ENDOSCOPY;  Service: Endoscopy;  Laterality: N/A; No date: history of colonoscopy] 07/22/2018: ILEOSCOPY; N/A     Comment:  Procedure: ILEOSCOPY THROUGH STOMA;  Surgeon: Virgel Manifold, MD;  Location: [facility] ENDOSCOPY;  Service:               Endoscopy;  Laterality: N/A; No date: ILEOSTOMY 09/08/2018: ILEOSTOMY; N/A     Comment:  Procedure: ILEOSTOMY REVISION POSSIBLE CREATION;                Surgeon: Herbert Pun, MD;  Location: [facility] ORS;               Service: General;  Laterality: N/A; 08/15/2018: ILEOSTOMY CLOSURE; N/A     Comment:  Procedure: DILATION OF ILEOSTOMY STRICTURE;  Surgeon:               Herbert Pun, MD;  Location: [facility] ORS;  Service:              General;  Laterality: N/A; 05/04/2018: LAPAROTOMY; Right     Comment:  Procedure: EXPLORATORY LAPAROTOMY right colectomy right               and left ostomy;  Surgeon: Herbert Pun, MD;                Location: [facility] ORS;  Service: General;  Laterality:               Right; No date: LUNG BIOPSY No date: MASTECTOMY; Left     Comment:  2000, [facility] No date: ROTATOR CUFF REPAIR; Right     Comment:  [facility]     Reproductive/Obstetrics negative OB ROS                             Anesthesia Physical  Anesthesia Plan  ASA: 3  Anesthesia Plan: General   Post-op Pain Management:    Induction: Intravenous  PONV Risk Score and Plan: 3 and Propofol infusion and TIVA  Airway Management Planned: Natural Airway and Nasal Cannula  Additional Equipment:   Intra-op Plan:   Post-operative Plan:   Informed Consent: I have reviewed the patients History and Physical, chart, labs and discussed the procedure including the risks,  benefits and alternatives for the proposed anesthesia with the patient or authorized representative who has indicated his/her  understanding and acceptance.     Dental Advisory Given  Plan Discussed with: Anesthesiologist  Anesthesia Plan Comments:         Anesthesia Quick Evaluation

## 2022-03-11 NOTE — Transfer of Care (Signed)
Immediate Anesthesia Transfer of Care Note  Patient: YSABEL STANKOVICH  Procedure(s) Performed: ESOPHAGOGASTRODUODENOSCOPY (EGD) WITH PROPOFOL COLONOSCOPY WITH PROPOFOL  Patient Location: PACU  Anesthesia Type:General  Level of Consciousness: awake, alert  and oriented  Airway & Oxygen Therapy: Patient Spontanous Breathing and Patient connected to nasal cannula oxygen  Post-op Assessment: Report given to RN and Post -op Vital signs reviewed and stable  Post vital signs: Reviewed and stable  Last Vitals:  Vitals Value Taken Time  BP 98/72 03/11/22 1624  Temp 36.7 C 03/11/22 1624  Pulse 70 03/11/22 1624  Resp 13 03/11/22 1625  SpO2 100 % 03/11/22 1624  Vitals shown include unvalidated device data.  Last Pain:  Vitals:   03/11/22 1624  TempSrc: Temporal  PainSc:          Complications: No notable events documented.

## 2022-03-11 NOTE — Op Note (Signed)
[facility]astroenter[facility] Patient Name: Danielle [name] Procedure Date: 03/11/2022 3:35 PM MRN: [id number] Account #: [id number] Date of Birth: 08-12-1956 Admit Type: Inpatient Age: 66 Room: [facility] ENDO ROOM 4 Gender: Female Note Status: Finalized Instrument Name: Upper Endoscope [id number] Procedure:             Upper GI endoscopy Indications:           Melena Providers:             Annamaria Helling DO, DO Medicines:             Monitored Anesthesia Care Complications:         No immediate complications. Estimated blood loss:                         Minimal. Procedure:             Pre-Anesthesia Assessment:                        - Prior to the procedure, a History and Physical was                         performed, and patient medications and allergies were                         reviewed. The patient is competent. The risks and                         benefits of the procedure and the sedation options and                         risks were discussed with the patient. All questions                         were answered and informed consent was obtained.                         Patient identification and proposed procedure were                         verified by the physician, the nurse, the anesthetist                         and the technician in the endoscopy suite. Mental                         Status Examination: alert and oriented. Airway                         Examination: normal oropharyngeal airway and neck                         mobility. Respiratory Examination: clear to                         auscultation. CV Examination: RRR, no murmurs, no S3                         or S4. Prophylactic Antibiotics: The patient does not  require prophylactic antibiotics. Prior                         Anticoagulants: The patient has taken Eliquis                         (apixaban), last dose was 2 days prior to procedure.                          ASA Grade Assessment: III - A patient with severe                         systemic disease. After reviewing the risks and                         benefits, the patient was deemed in satisfactory                         condition to undergo the procedure. The anesthesia                         plan was to use monitored anesthesia care (MAC).                         Immediately prior to administration of medications,                         the patient was re-assessed for adequacy to receive                         sedatives. The heart rate, respiratory rate, oxygen                         saturations, blood pressure, adequacy of pulmonary                         ventilation, and response to care were monitored                         throughout the procedure. The physical status of the                         patient was re-assessed after the procedure.                        After obtaining informed consent, the endoscope was                         passed under direct vision. Throughout the procedure,                         the patient's blood pressure, pulse, and oxygen                         saturations were monitored continuously. The Endoscope                         was introduced through the mouth, and advanced to the  second part of duodenum. The upper GI endoscopy was                         accomplished without difficulty. The patient tolerated                         the procedure well. Findings:      Localized mild inflammation characterized by erythema was found in the       duodenal bulb. Estimated blood loss: none.      The exam of the duodenum was otherwise normal.      A small hiatal hernia was present. Estimated blood loss: none.      Localized mild inflammation characterized by erythema was found in the       gastric antrum. Biopsies were taken with a cold forceps for Helicobacter       pylori testing. Estimated blood loss was minimal.       A single 1 to 2 mm sessile polyp with no bleeding and no stigmata of       recent bleeding was found in the prepyloric region of the stomach. The       polyp was removed with a cold biopsy forceps. Resection and retrieval       were complete. To prevent bleeding after the polypectomy, one hemostatic       clip was successfully placed (MR conditional). There was no bleeding at       the end of the procedure. Estimated blood loss was minimal. No other       fresh or old blood noted in the stomach      The Z-line was variable, <1cm. Estimated blood loss: none.      Esophagogastric landmarks were identified: the gastroesophageal junction       was found at 38 cm from the incisors.      The exam of the esophagus was otherwise normal. Impression:            - Duodenitis.                        - Small hiatal hernia.                        - Gastritis. Biopsied.                        - A single gastric polyp. Resected and retrieved. Clip                         (MR conditional) was placed.                        - Z-line variable, <1cm.                        - Esophagogastric landmarks identified. Recommendation:        - Return patient to hospital ward for ongoing care.                        - Resume regular diet.                        - Continue present medications.                        -  No aspirin, ibuprofen, naproxen, or other                         non-steroidal anti-inflammatory drugs.                        - Await pathology results.                        - The findings and recommendations were discussed with                         the patient.                        - proceed with colonoscopy Procedure Code(s):     --- Professional ---                        [phone], Esophagogastroduodenoscopy, flexible,                         transoral; with biopsy, single or multiple Diagnosis Code(s):     --- Professional ---                        K29.80, Duodenitis without bleeding                         K44.9, Diaphragmatic hernia without obstruction or                         gangrene                        K29.70, Gastritis, unspecified, without bleeding                        K31.7, Polyp of stomach and duodenum                        K92.1, Melena (includes Hematochezia) CPT copyright 2019 [employer] Association. All rights reserved. The codes documented in this report are preliminary and upon coder review may  be revised to meet current compliance requirements. Attending Participation:      I personally performed the entire procedure. Volney American, DO Annamaria Helling DO, DO 03/11/2022 4:05:08 PM This report has been signed electronically. Number of Addenda: 0 Note Initiated On: 03/11/2022 3:35 PM Estimated Blood Loss:  Estimated blood loss was minimal.      [facility]

## 2022-03-11 NOTE — Anesthesia Postprocedure Evaluation (Signed)
Anesthesia Post Note  Patient: Danielle [name]  Procedure(s) Performed: ESOPHAGOGASTRODUODENOSCOPY (EGD) WITH PROPOFOL COLONOSCOPY WITH PROPOFOL  Patient location during evaluation: PACU Anesthesia Type: General Level of consciousness: awake and alert Pain management: pain level controlled Vital Signs Assessment: post-procedure vital signs reviewed and stable Respiratory status: spontaneous breathing, nonlabored ventilation, respiratory function stable and patient connected to nasal cannula oxygen Cardiovascular status: blood pressure returned to baseline and stable Postop Assessment: no apparent nausea or vomiting Anesthetic complications: no   No notable events documented.   Last Vitals:  Vitals:   03/11/22 1624 03/11/22 1719  BP: 98/72 119/73  Pulse: 70 63  Resp: 20 19  Temp: 36.7 C 36.8 C  SpO2: 100% 99%    Last Pain:  Vitals:   03/11/22 1719  TempSrc: Oral  PainSc:                  Martha Clan

## 2022-03-11 NOTE — Interval H&P Note (Signed)
History and Physical Interval Note: Progress note from 03/11/22  was reviewed and there was no interval change after seeing and examining the patient.  Written consent was obtained from the patient after discussion of risks, benefits, and alternatives. Patient has consented to proceed with Esophagogastroduodenoscopy and Colonoscopy with possible intervention   03/11/2022 11:48 AM  Danielle [name]  has presented today for surgery, with the diagnosis of melena, brbpr, anemia.  The various methods of treatment have been discussed with the patient and family. After consideration of risks, benefits and other options for treatment, the patient has consented to  Procedure(s): ESOPHAGOGASTRODUODENOSCOPY (EGD) WITH PROPOFOL (N/A) COLONOSCOPY WITH PROPOFOL (N/A) as a surgical intervention.  The patient's history has been reviewed, patient examined, no change in status, stable for surgery.  I have reviewed the patient's chart and labs.  Questions were answered to the patient's satisfaction.     Annamaria Helling

## 2022-03-13 ENCOUNTER — Encounter: Payer: Self-pay | Admitting: Gastroenterology

## 2022-03-14 LAB — ACID FAST CULTURE WITH REFLEXED SENSITIVITIES (MYCOBACTERIA): Acid Fast Culture: NEGATIVE

## 2022-03-15 LAB — SURGICAL PATHOLOGY

## 2022-03-22 ENCOUNTER — Ambulatory Visit: Payer: Medicare Other | Admitting: Family

## 2022-04-03 IMAGING — CT CT HEAD W/O CM
4 series · 16 of 47 positions shown, 18 images · non-contrast
Comparison: 06/05/2020

CLINICAL DATA: Fall

EXAM:
CT HEAD WITHOUT CONTRAST
TECHNIQUE: Contiguous axial images were obtained from the base of the skull
through the vertex without intravenous contrast.

[Series 2: head wo · axial · 0.43mm/px · z∈[+609,+714]mm · 7 of 29 slices shown, 9 images]
[im 4/29  brain]
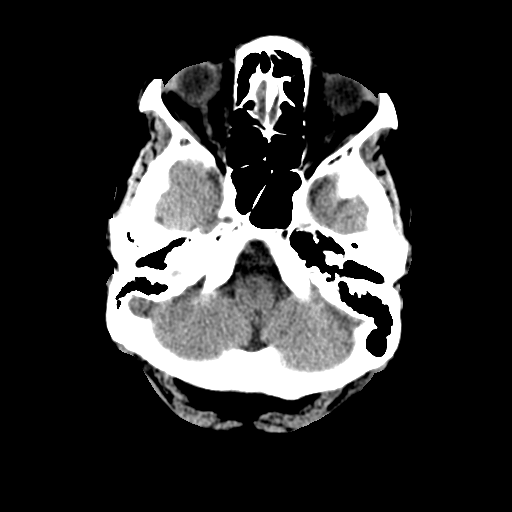
[im 4/29  bone]
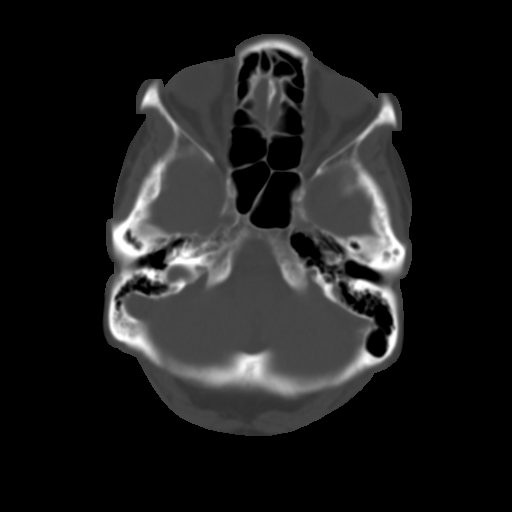
[im 8/29  brain]
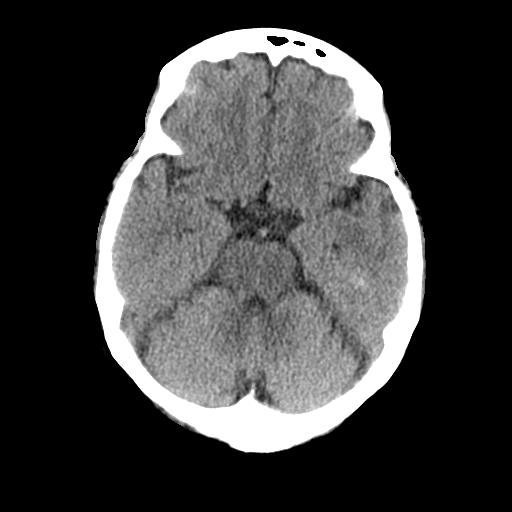
[im 11/29  brain]
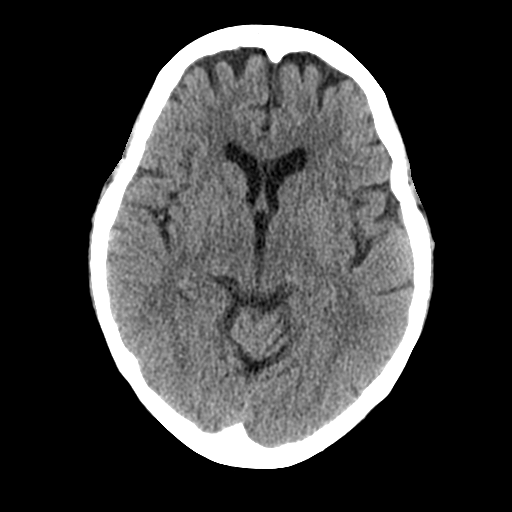
[im 15/29  brain]
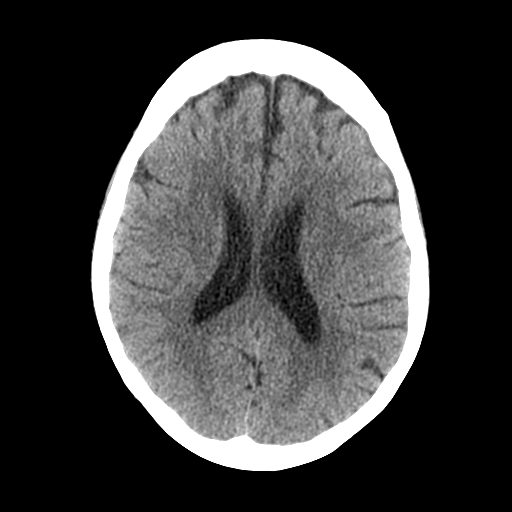
[im 18/29  brain]
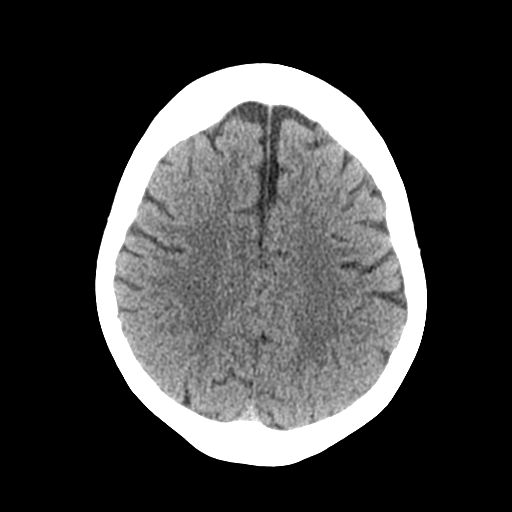
[im 18/29  bone]
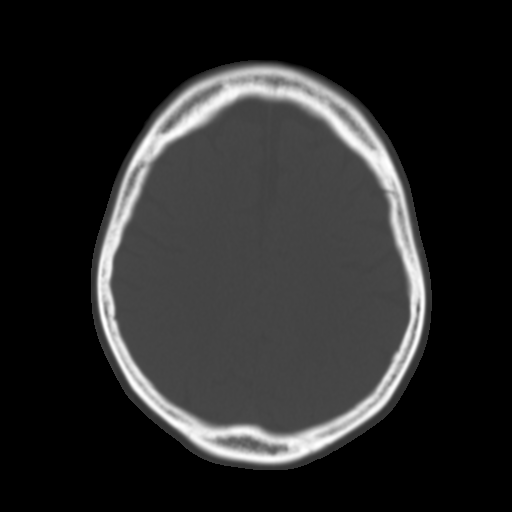
[im 22/29  brain]
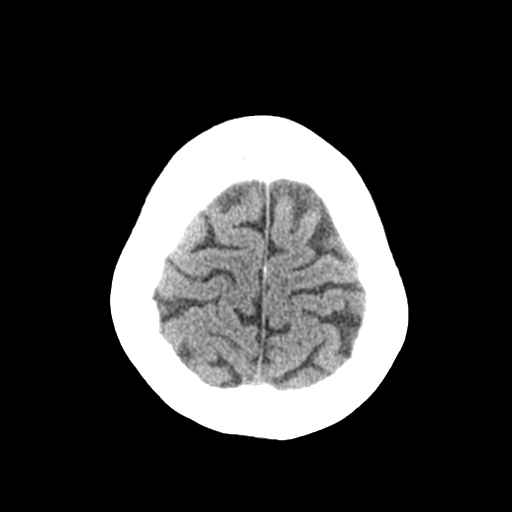
[im 25/29  brain]
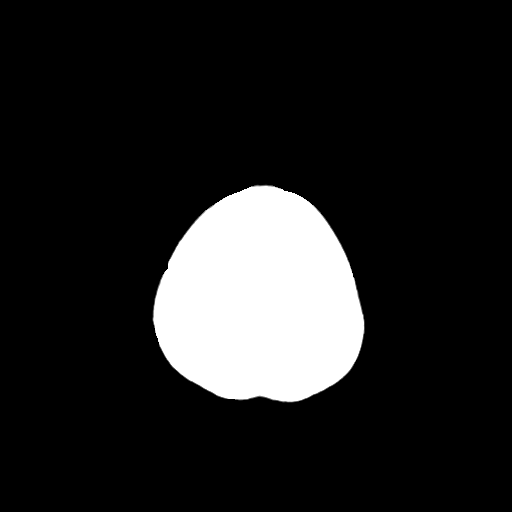

[Series 3: head bone · axial · 0.43mm/px · z∈[+608,+636]mm · 3 of 72 slices shown]
[im 8/72  bone]
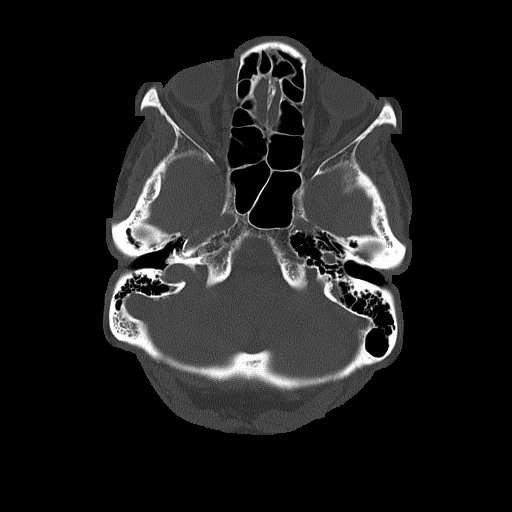
[im 15/72  bone]
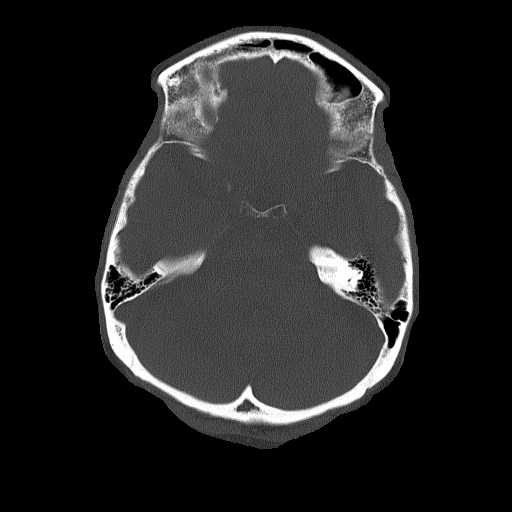
[im 22/72  bone]
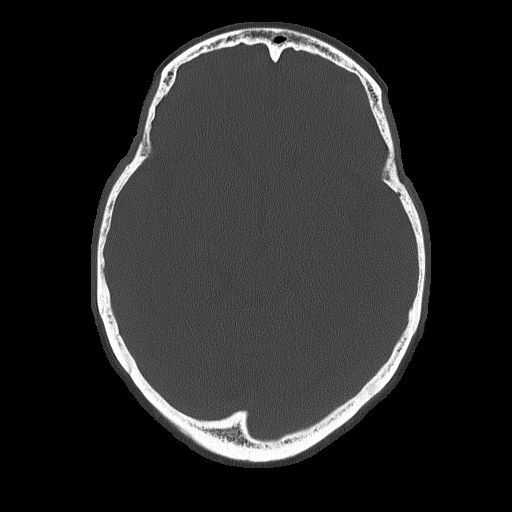

[Series 4: coronal soft tissue · coronal · 0.29mm/px · 3 of 66 slices shown]
[im 22/66  brain]
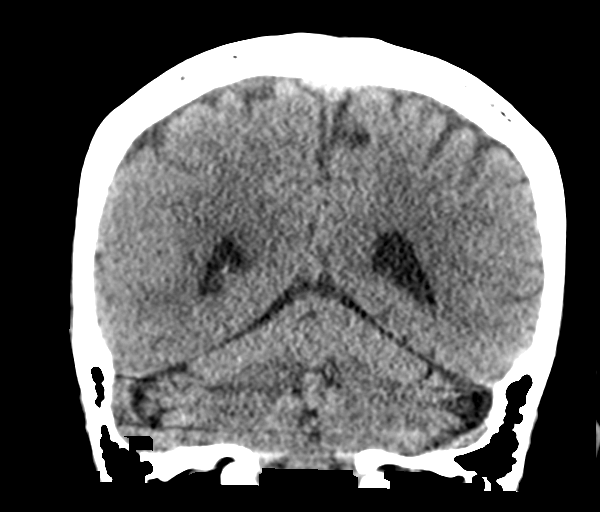
[im 29/66  brain]
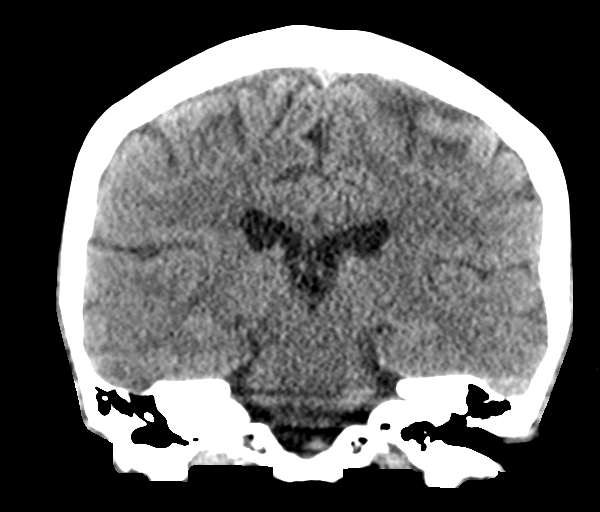
[im 37/66  brain]
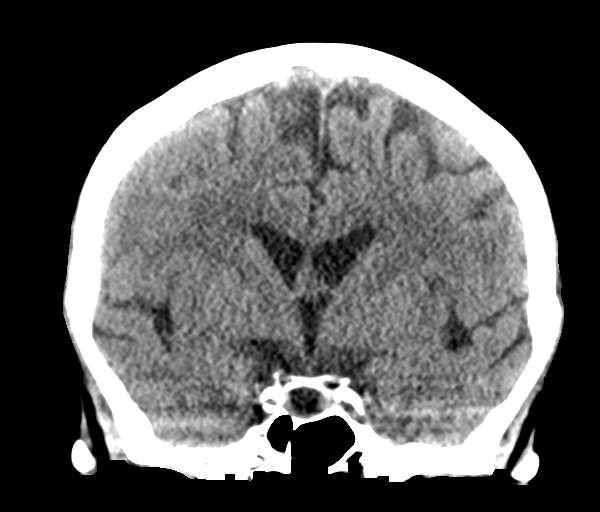

[Series 5: sagittal soft tissue · sagittal · 0.29mm/px · 3 of 53 slices shown]
[im 18/53  brain]
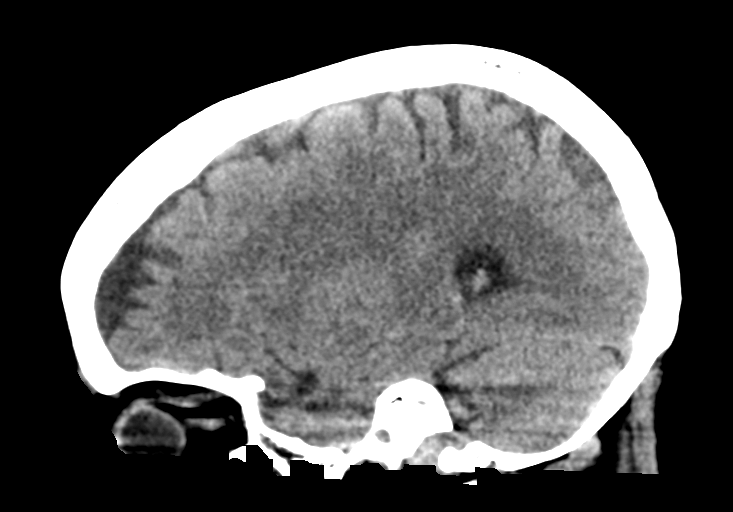
[im 27/53  brain]
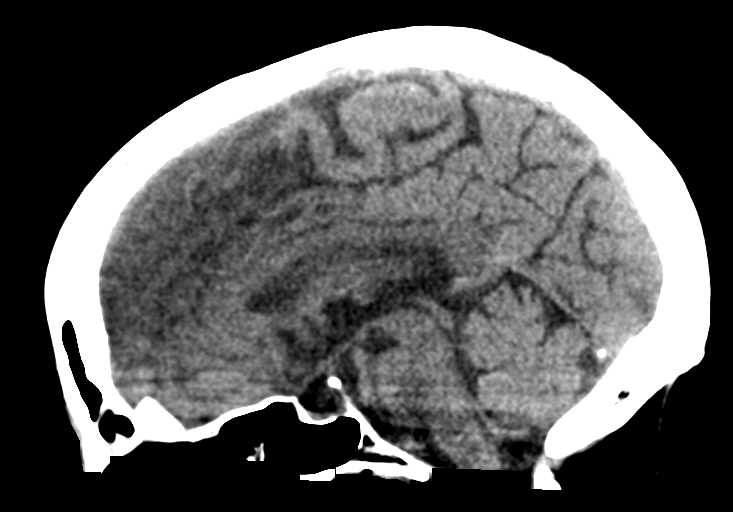
[im 35/53  brain]
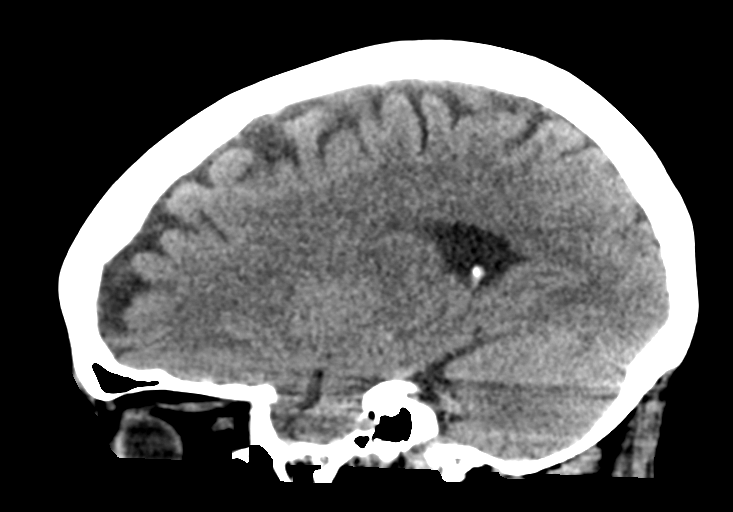

[16 of 47 positions shown; findings below may reference images not displayed]

FINDINGS: Brain: There is no mass, hemorrhage or extra-axial collection. The
size and configuration of the ventricles and extra-axial CSF spaces
are normal. The brain parenchyma is normal, without acute or chronic
infarction.

Vascular: No abnormal hyperdensity of the major intracranial
arteries or dural venous sinuses. No intracranial atherosclerosis.

Skull: The visualized skull base, calvarium and extracranial soft
tissues are normal.

Sinuses/Orbits: No fluid levels or advanced mucosal thickening of
the visualized paranasal sinuses. No mastoid or middle ear effusion.
The orbits are normal.
IMPRESSION: Normal head CT.

## 2022-04-03 IMAGING — CR DG RIBS W/ CHEST 3+V*L*
3 series · 3 of 3 positions shown · non-contrast
Comparison: 07/31/2020, 01/20/2020

CLINICAL DATA: Fall

EXAM:
LEFT RIBS AND CHEST - 3+ VIEW

[chest ap]
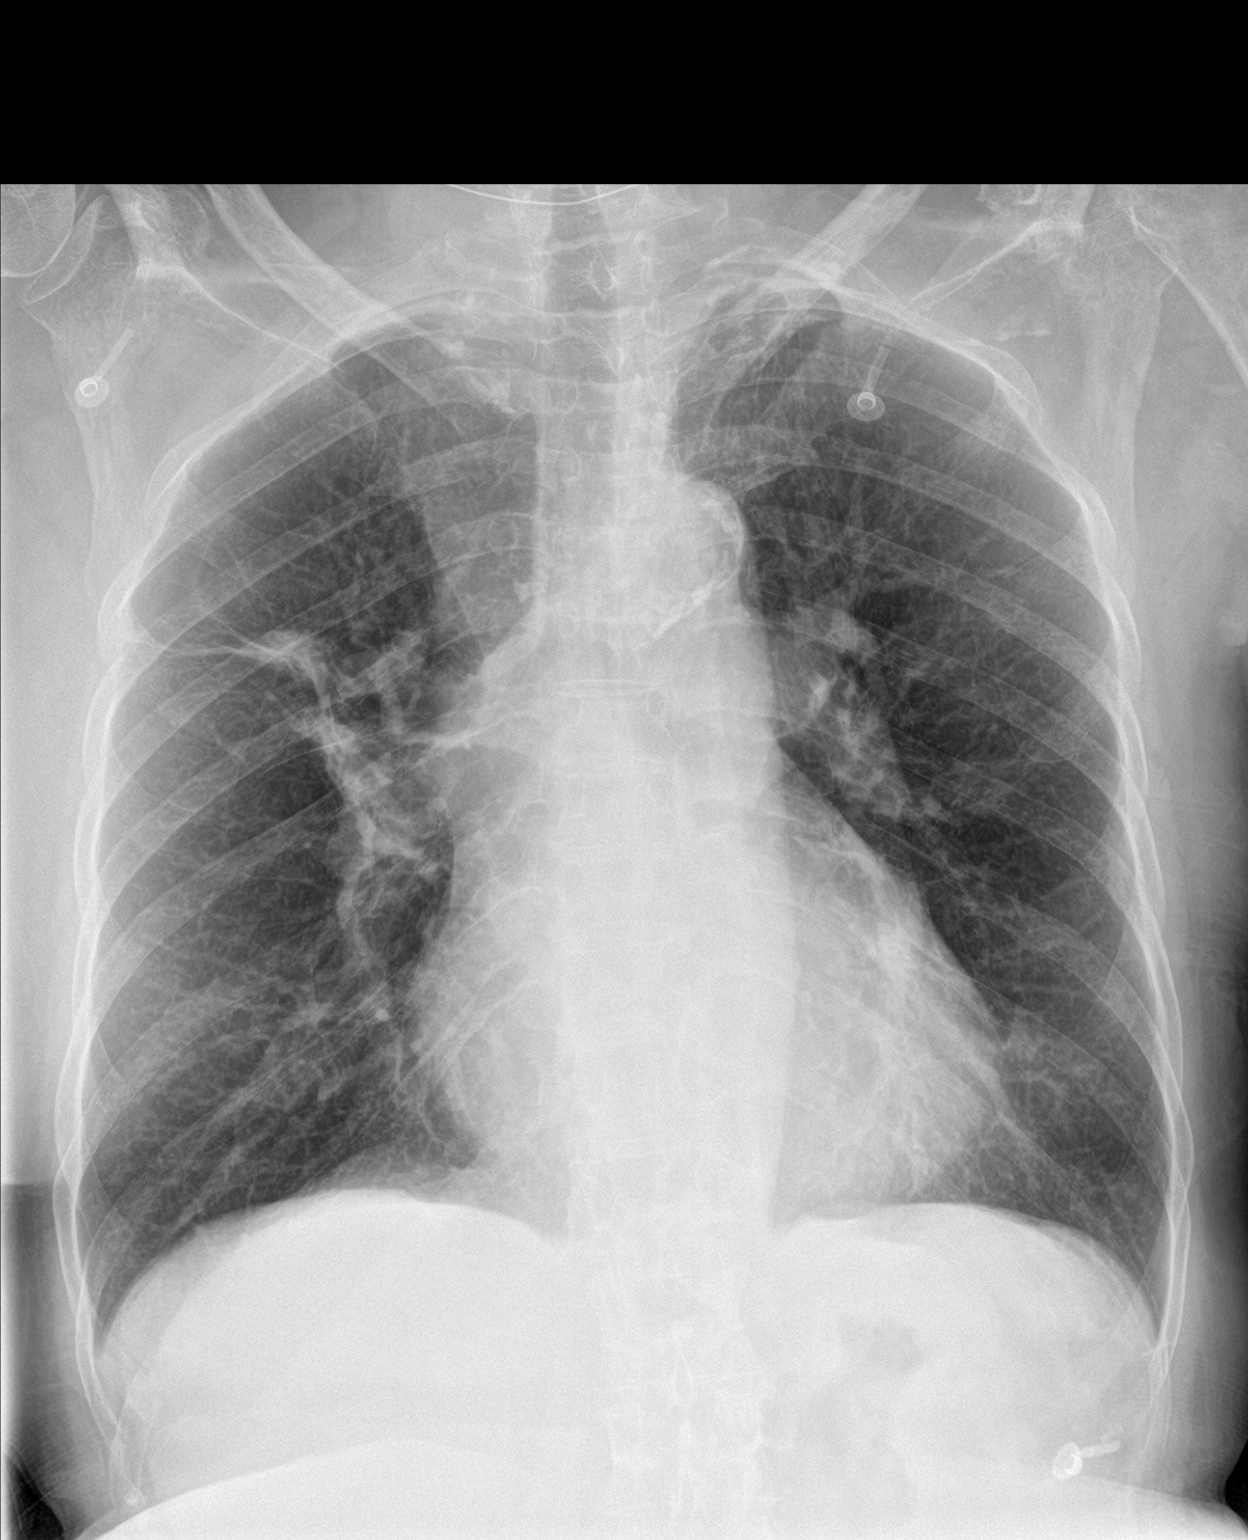

[rib pa]
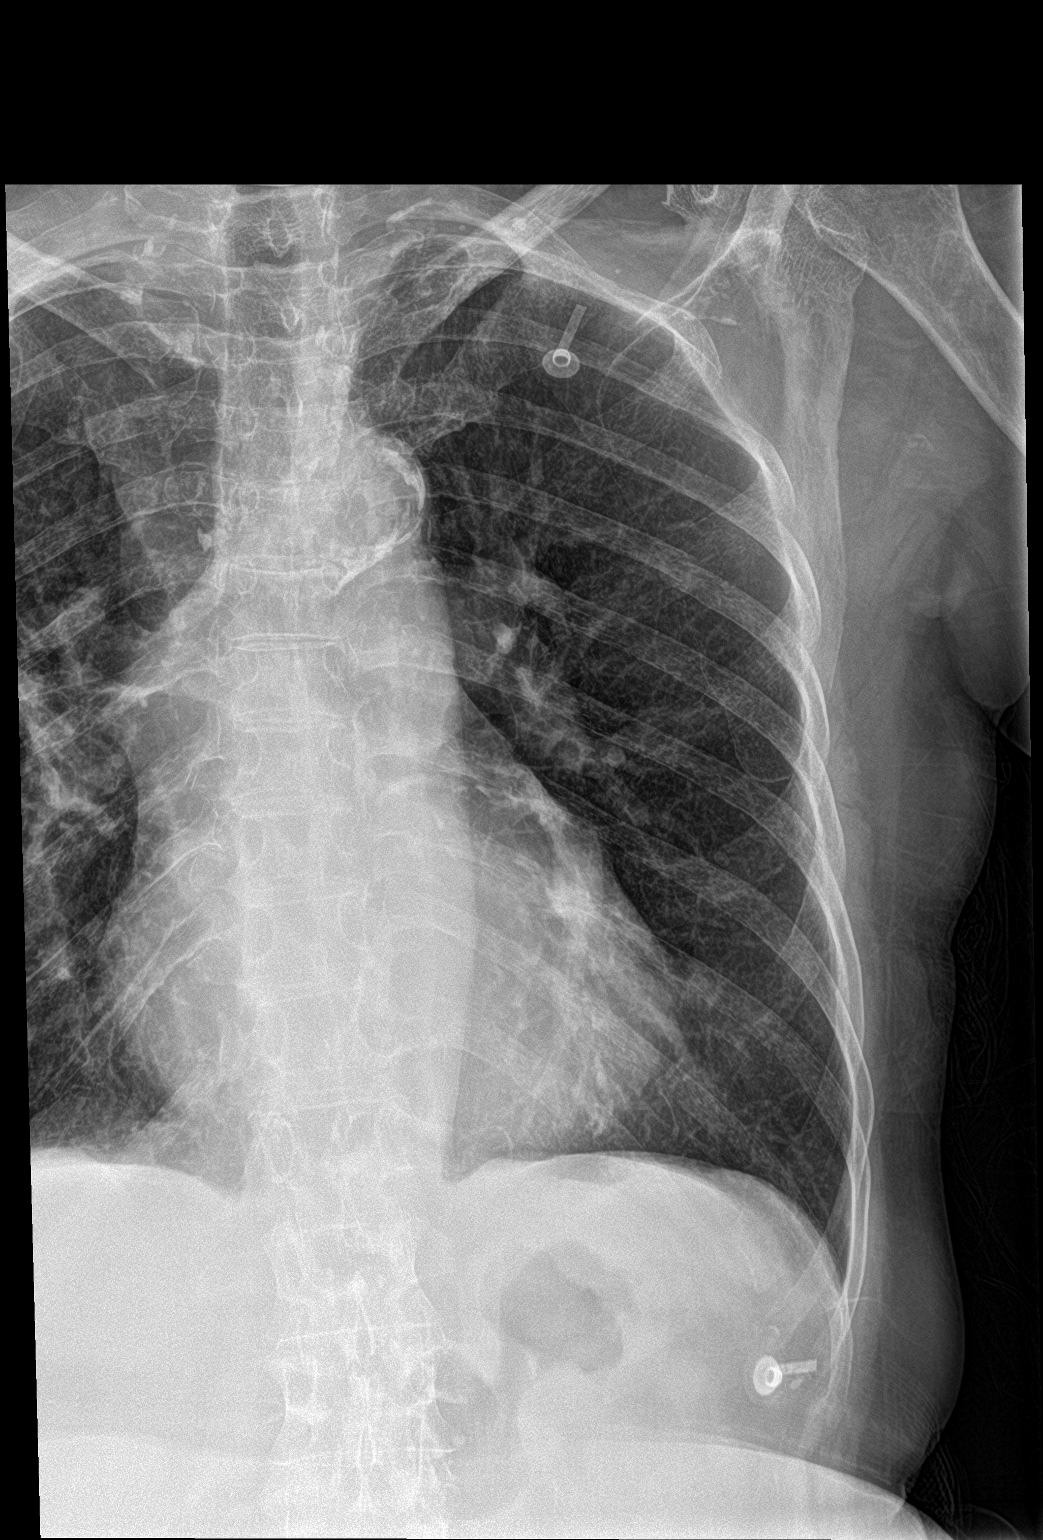

[rib pa obl]
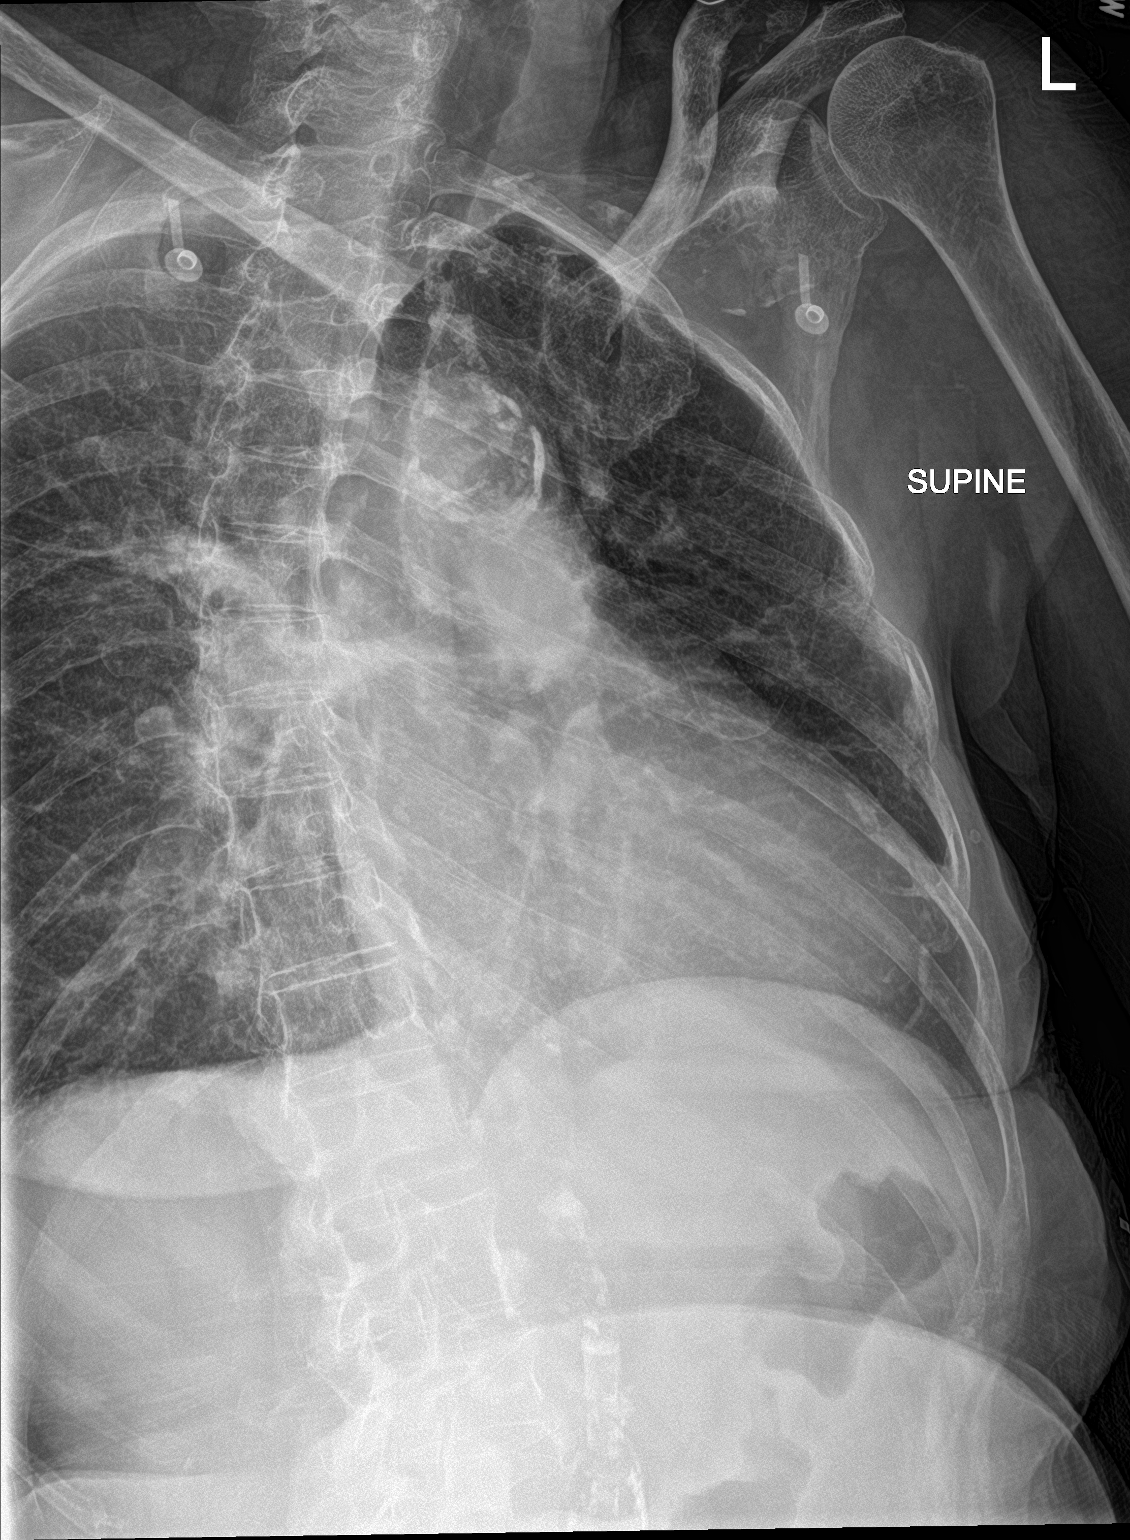

[3 of 3 positions shown; findings below may reference images not displayed]

FINDINGS: Unchanged nodular density in the right upper lobe. Cardiomediastinal
contours are normal. Calcific atherosclerosis.

No rib fracture.
IMPRESSION: No rib fracture.

## 2022-04-03 IMAGING — CR DG SHOULDER 2+V*L*
3 series · 3 of 3 positions shown · non-contrast
Comparison: None.

CLINICAL DATA: Fall

EXAM:
LEFT SHOULDER - 2+ VIEW

[shoulder grashey]
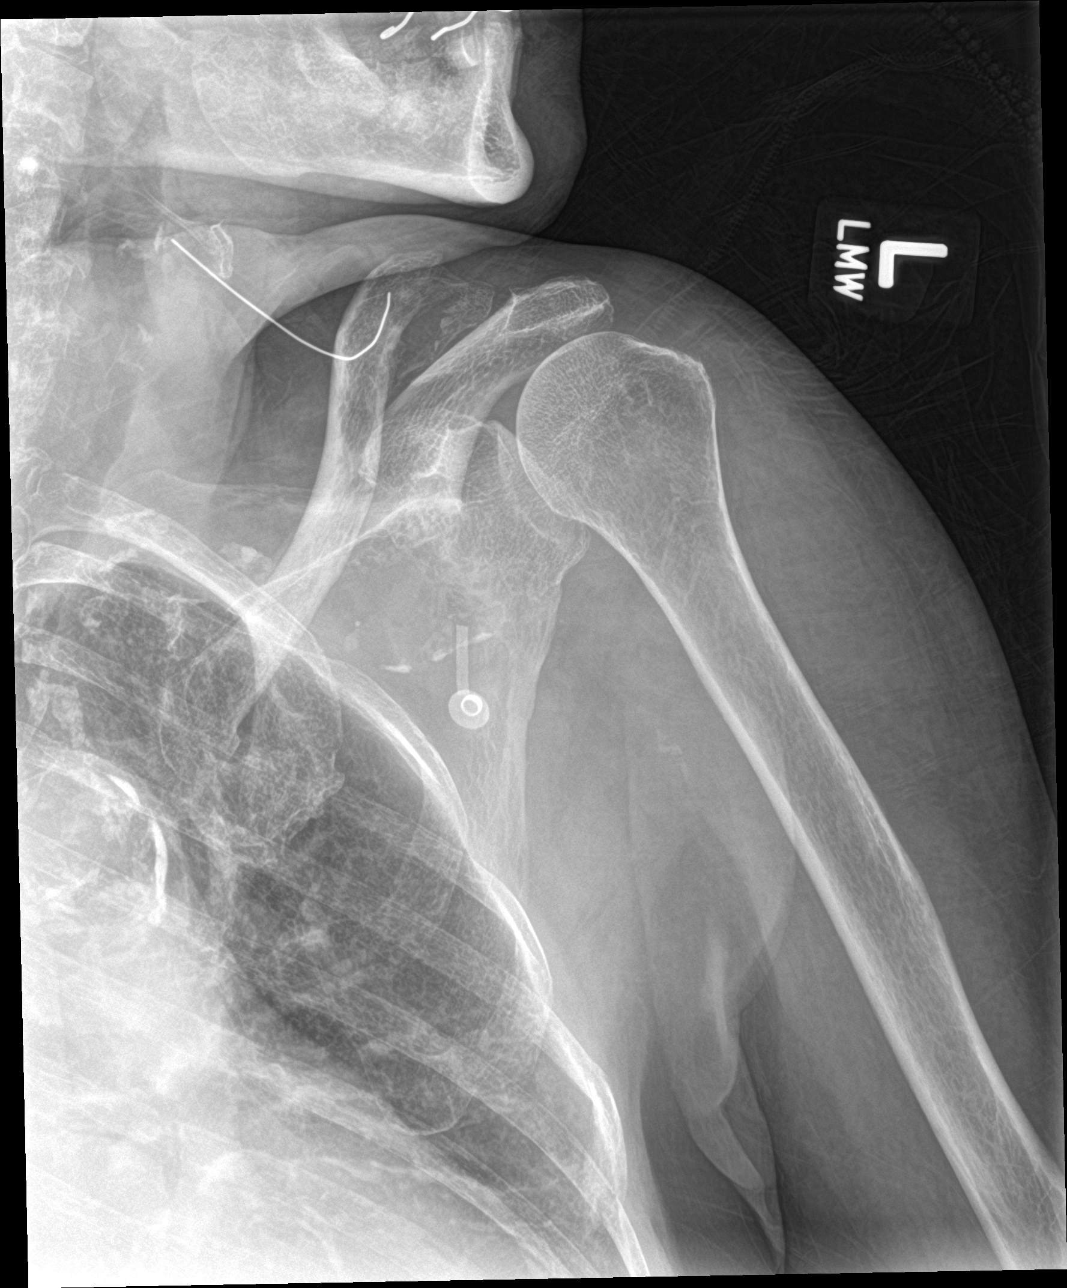

[shoulder y view]
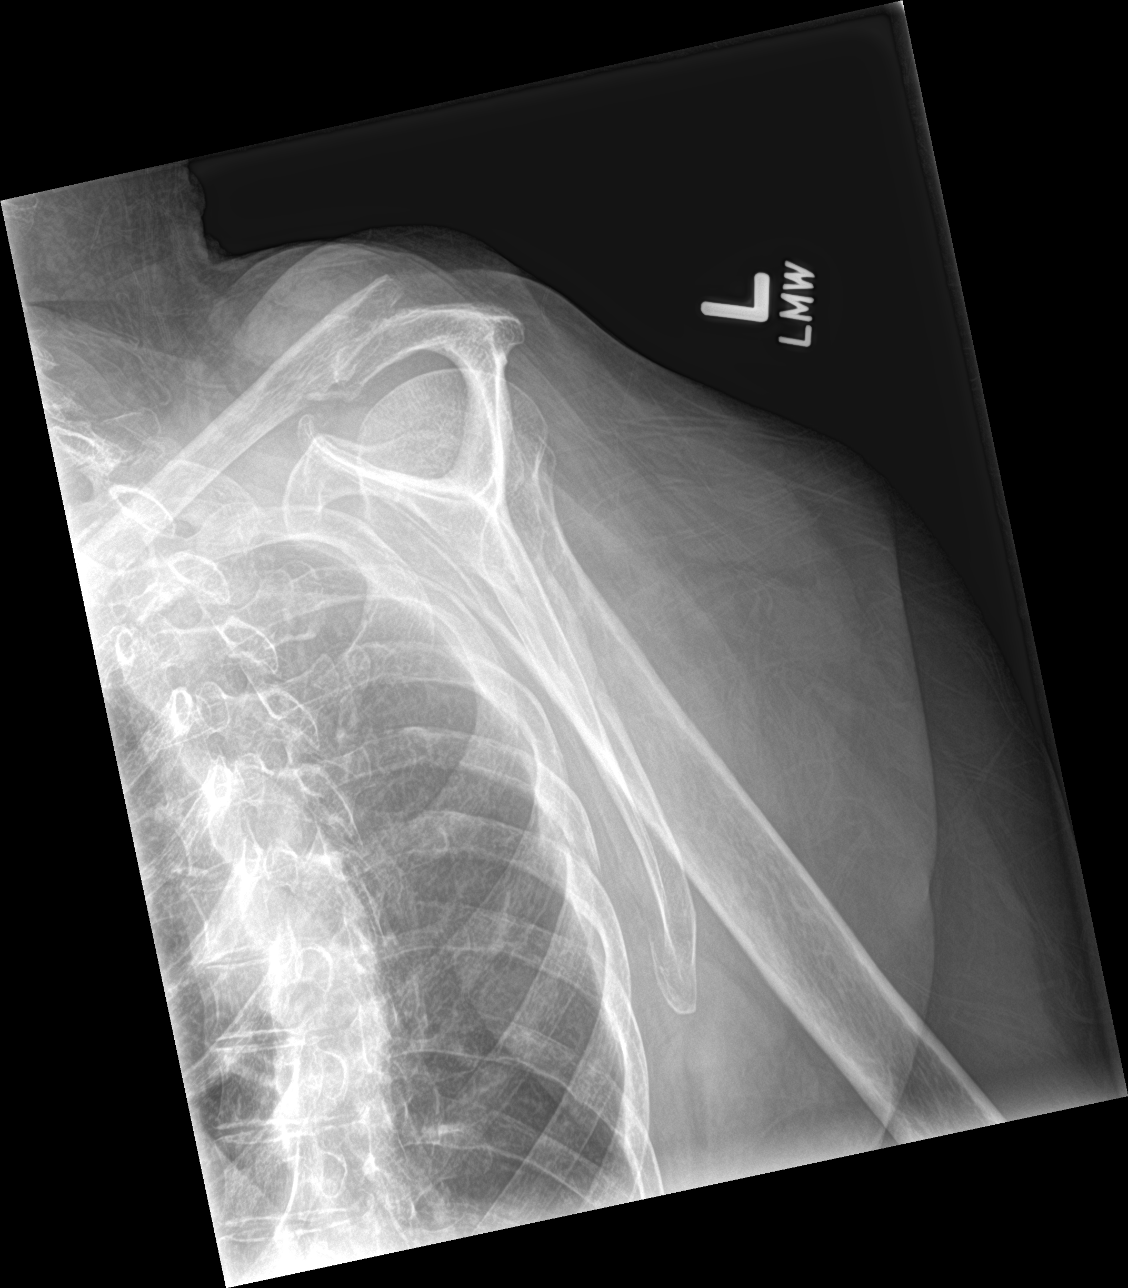

[shoulder ap neutral]
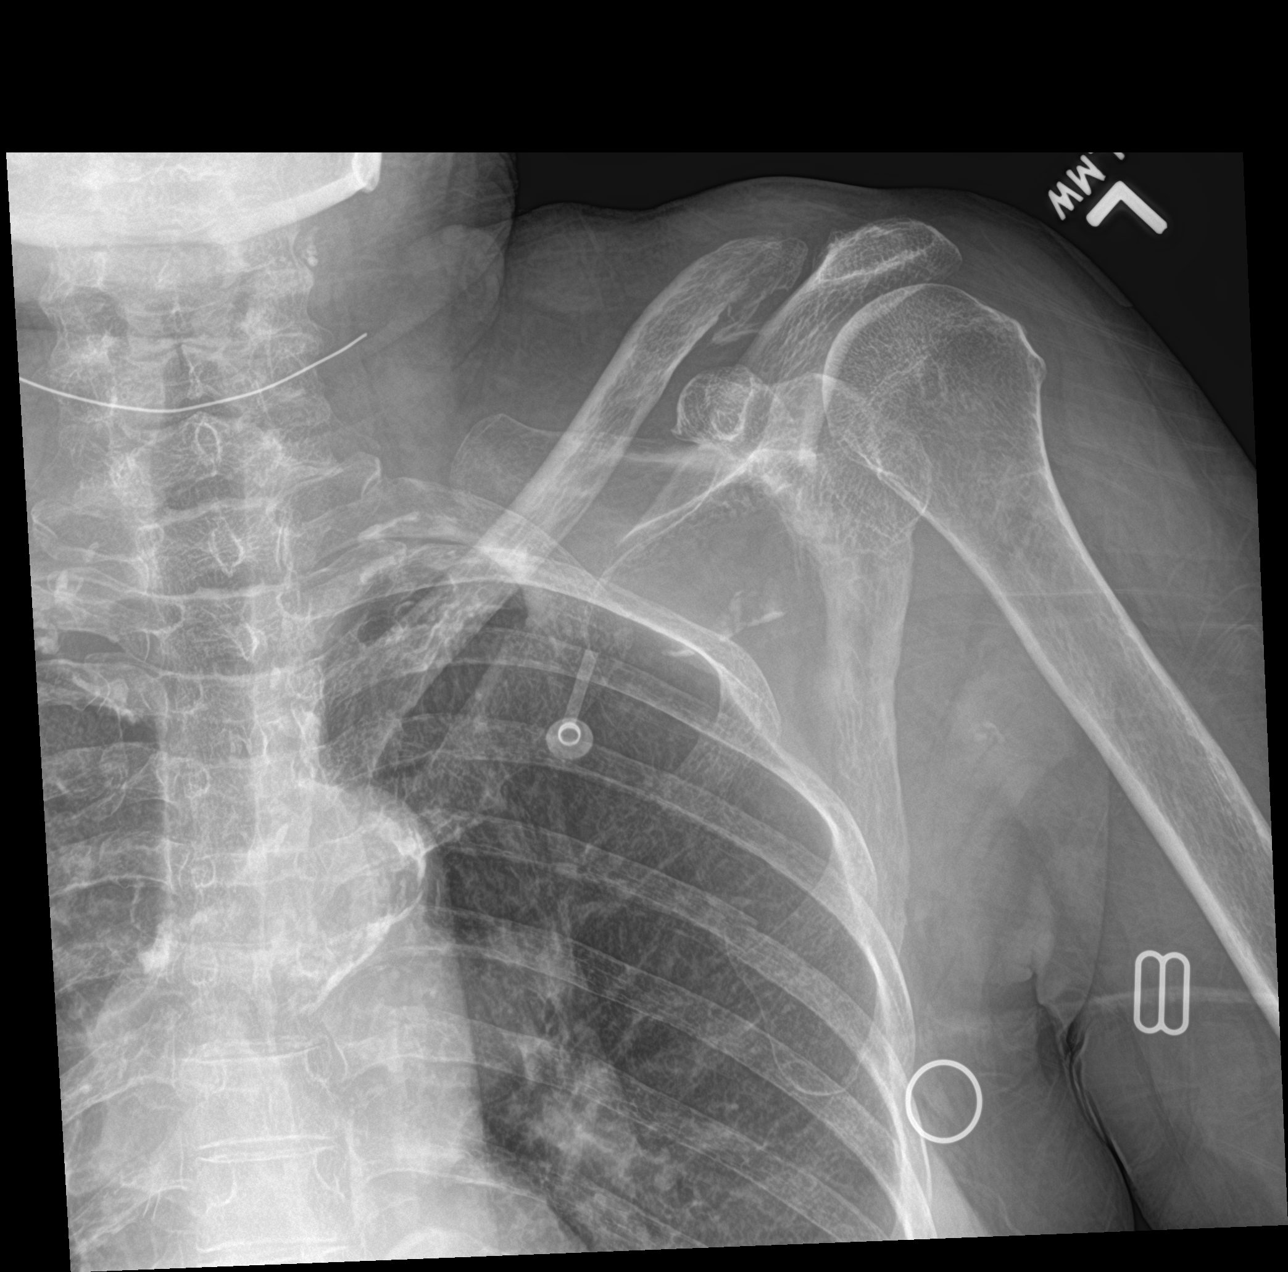

[3 of 3 positions shown; findings below may reference images not displayed]

FINDINGS: No fracture of the proximal left humerus. Ossific irregularity at
the most lateral aspect of the left clavicle is favored to be
tendinous calcification. No dislocation.
IMPRESSION: 1. No fracture or dislocation of the proximal left humerus.
2. Ossific irregularity at the most lateral aspect of the left
clavicle is favored to be tendinous calcification.

## 2022-04-12 ENCOUNTER — Ambulatory Visit: Payer: Medicare Other | Attending: Family | Admitting: Family

## 2022-04-12 ENCOUNTER — Encounter: Payer: Self-pay | Admitting: Family

## 2022-04-12 VITALS — BP 113/76 | HR 74 | Resp 18 | Ht 65.0 in | Wt 116.0 lb

## 2022-04-12 DIAGNOSIS — I5022 Chronic systolic (congestive) heart failure: Secondary | ICD-10-CM

## 2022-04-12 DIAGNOSIS — I10 Essential (primary) hypertension: Secondary | ICD-10-CM

## 2022-04-12 DIAGNOSIS — I130 Hypertensive heart and chronic kidney disease with heart failure and stage 1 through stage 4 chronic kidney disease, or unspecified chronic kidney disease: Secondary | ICD-10-CM | POA: Diagnosis not present

## 2022-04-12 DIAGNOSIS — I480 Paroxysmal atrial fibrillation: Secondary | ICD-10-CM | POA: Diagnosis not present

## 2022-04-12 DIAGNOSIS — Z79899 Other long term (current) drug therapy: Secondary | ICD-10-CM | POA: Insufficient documentation

## 2022-04-12 DIAGNOSIS — J449 Chronic obstructive pulmonary disease, unspecified: Secondary | ICD-10-CM | POA: Insufficient documentation

## 2022-04-12 DIAGNOSIS — I4891 Unspecified atrial fibrillation: Secondary | ICD-10-CM | POA: Insufficient documentation

## 2022-04-12 DIAGNOSIS — I5042 Chronic combined systolic (congestive) and diastolic (congestive) heart failure: Secondary | ICD-10-CM | POA: Insufficient documentation

## 2022-04-12 DIAGNOSIS — Z9981 Dependence on supplemental oxygen: Secondary | ICD-10-CM | POA: Diagnosis not present

## 2022-04-12 DIAGNOSIS — Z7951 Long term (current) use of inhaled steroids: Secondary | ICD-10-CM | POA: Diagnosis not present

## 2022-04-12 NOTE — Patient Instructions (Addendum)
Continue weighing daily and call for an overnight weight gain of 3 pounds or more or a weekly weight gain of more than 5 pounds.   If you have voicemail, please make sure your mailbox is cleaned out so that we may leave a message and please make sure to listen to any voicemails.    Keep taking the metoprolol as 1/2 tablet in the morning and 1/2 tablet in the evening

## 2022-04-12 NOTE — Progress Notes (Signed)
Patient ID: Danielle [name], female    DOB: 29-Apr-1956, 66 y.o.   MRN: [id number]  HPI  Danielle [name] is a 66 y/o female with a history of breast/ lung/ esophageal cancer, DM, HTN, COPD, anxiety, depression, lymphedema, previous tobacco use and chronic heart failure.   Echo report from 01/30/22 reviewed and showed an EF of 35-40% along with moderate MR, moderate/severe TR and moderate AS. Echo report from 11/07/19 reviewed and showed an EF of 50-55% along with mild TR and moderately elevated PA pressure. Echo report from 04/21/18 reviewed and showed an EF of 35-40% along with mild AS/ trivial AR and moderate MR.   Admitted 03/09/22 due to GIB. Given IV protonix drip. GI consult obtained. Upper.lower endoscopy done which showed diverticulosis and hemorrhoids. No active bleeding. Eliquis held but then restarted. Discharged after 2 days. Admitted 02/21/22 due to shortness of breath and found to be in AF RVR. Cardiology consult obtained. Successful cardioversion done. Chest CT showed stable right-sided pleural effusion. Underwent thoracentesis, 650 cc was removed. Discharged after 3 days.   She presents today for a follow-up visit with a chief complaint of moderate fatigue upon minimal exertion. Describes this as chronic in nature although she feels like it's worsening over the last several weeks. She has associated decreased appetite, chest tightness, cough, shortness of breath, wheezing, abdominal distention and headaches along with this. She denies any difficulty sleeping, dizziness, palpitations, pedal edema, chest pain or weight gain.   Upon review of her medication bottles, she's been taking 12.18m metoprolol tartrate BID along with 274mmetoprolol tartrate BID because she didn't realize that they were the same medication. She is no longer taking midodrine.   Past Medical History:  Diagnosis Date   Anemia    Anxiety    Aortic atherosclerosis ([place]   Aortic valve stenosis 02/10/2018   a.) TTE 02/10/2018:  EF 55-60%: mild AS with MPG of 12 mmHg. b.) TTE 04/21/2018: EF 35-40%; mild AS with MPG 8 mmHg. c.) TTE 11/07/2019: EF 50-55%; mild AS with MPG 10 mmHg.   Arthritis    Atrial fibrillation (HCC)    a.) CHA2DS2-VASc = 4 (age, sex, HTN, aortic plaque). b.) Rate/rhythm maintained on oral carvedilol; chronically anticoagulated on dose reduced apixaban. c.)  Attempted deployment of LAA occlusive device on 01/11/2021; parameters failed and procedure aborted.   Breast cancer, left ([facility]   a.) T2N1M0; ER/PR (+) --> Tx'd with total mastectomy, LN resection, XRT, and chemotherapy   Cancer of right lung ([facility]   a.) adenocarcinoma; ALK, ROS1, PDL1, BRAF, EGFR all negative.   Chronic diastolic CHF (congestive heart failure) ([facility]2/09/2022   CKD (chronic kidney disease), stage IV (HCC)    COPD (chronic obstructive pulmonary disease) (HCC)    Dependence on supplemental oxygen    Depression    Diastolic dysfunction 0448/54/6270 a.) TTE 02/10/2017: EF 55-60%; G2DD. b.) TTE 04/21/2018: EF 35-40%; mild LA dilation; mod MV regurgitation. c.) TTE 11/07/2019: EF 50-55%; G1DD.   DOE (dyspnea on exertion)    GERD (gastroesophageal reflux disease)    Heart murmur    History of 2019 novel coronavirus disease (COVID-19) 10/14/2019   HLD (hyperlipidemia)    Hypertension    Long term current use of anticoagulant    a.) apixaban   Lymphedema    Personal history of chemotherapy    Personal history of radiation therapy    SBO (small bowel obstruction) ([facility]/22/2022   Vitamin D deficiency    Past  Surgical History:  Procedure Laterality Date   Breast Biospy Left    [facility]   BREAST SURGERY     CARDIOVERSION N/A 02/23/2022   Procedure: CARDIOVERSION;  Surgeon: Corey Skains, MD;  Location: [facility] ORS;  Service: Cardiovascular;  Laterality: N/A;   COLONOSCOPY N/A 04/30/2018   Procedure: COLONOSCOPY;  Surgeon: Virgel Manifold, MD;  Location: [facility] ENDOSCOPY;  Service: Endoscopy;  Laterality:  N/A;   COLONOSCOPY N/A 07/22/2018   Procedure: COLONOSCOPY;  Surgeon: Virgel Manifold, MD;  Location: [facility] ENDOSCOPY;  Service: Endoscopy;  Laterality: N/A;   COLONOSCOPY WITH PROPOFOL N/A 09/21/2021   Procedure: COLONOSCOPY WITH PROPOFOL;  Surgeon: Benjamine Sprague, DO;  Location: [place] ENDOSCOPY;  Service: General;  Laterality: N/A;   COLONOSCOPY WITH PROPOFOL N/A 03/11/2022   Procedure: COLONOSCOPY WITH PROPOFOL;  Surgeon: Annamaria Helling, DO;  Location: [facility] ENDOSCOPY;  Service: Gastroenterology;  Laterality: N/A;   DILATION AND CURETTAGE OF UTERUS     ELECTROMAGNETIC NAVIGATION BROCHOSCOPY Right 04/11/2016   Procedure: ELECTROMAGNETIC NAVIGATION BRONCHOSCOPY;  Surgeon: Vilinda Boehringer, MD;  Location: [facility] ORS;  Service: Cardiopulmonary;  Laterality: Right;   ESOPHAGOGASTRODUODENOSCOPY N/A 07/22/2018   Procedure: ESOPHAGOGASTRODUODENOSCOPY (EGD);  Surgeon: Virgel Manifold, MD;  Location: [facility] ENDOSCOPY;  Service: Endoscopy;  Laterality: N/A;   ESOPHAGOGASTRODUODENOSCOPY (EGD) WITH PROPOFOL N/A 05/07/2018   Procedure: ESOPHAGOGASTRODUODENOSCOPY (EGD) WITH PROPOFOL;  Surgeon: Lucilla Lame, MD;  Location: [facility] ENDOSCOPY;  Service: Endoscopy;  Laterality: N/A;   ESOPHAGOGASTRODUODENOSCOPY (EGD) WITH PROPOFOL N/A 04/24/2019   Procedure: ESOPHAGOGASTRODUODENOSCOPY (EGD) WITH PROPOFOL;  Surgeon: Jonathon Bellows, MD;  Location: [facility] ENDOSCOPY;  Service: Gastroenterology;  Laterality: N/A;   ESOPHAGOGASTRODUODENOSCOPY (EGD) WITH PROPOFOL N/A 01/12/2020   Procedure: ESOPHAGOGASTRODUODENOSCOPY (EGD) WITH PROPOFOL;  Surgeon: Jonathon Bellows, MD;  Location: [facility] ENDOSCOPY;  Service: Gastroenterology;  Laterality: N/A;   ESOPHAGOGASTRODUODENOSCOPY (EGD) WITH PROPOFOL N/A 04/28/2020   Procedure: ESOPHAGOGASTRODUODENOSCOPY (EGD) WITH PROPOFOL;  Surgeon: Jonathon Bellows, MD;  Location[facility] ENDOSCOPY;  Service: Gastroenterology;  Laterality: N/A;   ESOPHAGOGASTRODUODENOSCOPY (EGD) WITH PROPOFOL N/A 03/11/2022    Procedure: ESOPHAGOGASTRODUODENOSCOPY (EGD) WITH PROPOFOL;  Surgeon: Annamaria Helling, DO;  Location[facility]COPY;  Service: Gastroenterology;  Laterality: N/A;   EUS N/A 05/07/2019   Procedure: FULL UPPER ENDOSCOPIC ULTRASOUND (EUS) RADIAL;  Surgeon: Jola Schmidt, MD;  Location: [facility] ENDOSCOPY;  Service: Endoscopy;  Laterality: N/A;   ILEOSCOPY N/A 07/22/2018   Procedure: ILEOSCOPY THROUGH STOMA;  Surgeon: Virgel Manifold, MD;  Location: [facility] ENDOSCOPY;  Service: Endoscopy;  Laterality: N/A;   ILEOSTOMY     ILEOSTOMY N/A 09/08/2018   Procedure: ILEOSTOMY REVISION POSSIBLE CREATION;  Surgeon: Herbert Pun, MD;  Location: [facility] ORS;  Service: General;  Laterality: N/A;   ILEOSTOMY CLOSURE N/A 08/15/2018   Procedure: DILATION OF ILEOSTOMY STRICTURE;  Surgeon: Herbert Pun, MD;  Location: [facility] ORS;  Service: General;  Laterality: N/A;   LAPAROTOMY Right 05/04/2018   Procedure: EXPLORATORY LAPAROTOMY right colectomy right and left ostomy;  Surgeon: Herbert Pun, MD;  Location: [facility] ORS;  Service: General;  Laterality: Right;   LEFT ATRIAL APPENDAGE OCCLUSION N/A 01/11/2021   Procedure: LEFT ATRIAL APPENDAGE OCCLUSION ([place]); ABORTED PROCEDURE WITHOUT DEVICE BEING IMPLANTED; Location: [facility]; Surgeon: Mylinda Latina, MD   LUNG BIOPSY     MASTECTOMY Left    2000, [facility]   ROTATOR CUFF REPAIR Right    [place] TAKEDOWN N/A 10/23/2021   Procedure: XI ROBOTIC ASSISTED ILEOSTOMY TAKEDOWN;  Surgeon: Herbert Pun, MD;  Location: [facility] ORS;  Service: General;  Laterality: N/A;  180 minutes for the surgery part please   Family History  Problem Relation Age of Onset   Breast cancer Mother 70   Cancer Mother        Breast    Cirrhosis Father    Breast cancer Paternal Aunt 33   Cancer Maternal Aunt        Breast    Social History   Tobacco Use   Smoking status: Former    Packs/day: 0.50    Years: 20.00    Total pack  years: 10.00    Types: Cigarettes    Quit date: 07/02/2012    Years since quitting: 9.7   Smokeless tobacco: Current    Types: Snuff   Tobacco comments:    quit 2014  Substance Use Topics   Alcohol use: Yes    Comment: Occasionally beer   No Known Allergies  Prior to Admission medications   Medication Sig Start Date End Date Taking? Authorizing Provider  acetaminophen (TYLENOL) 325 MG tablet Take 2 tablets (650 mg total) by mouth every 6 (six) hours as needed for mild pain (or Fever >/= 101). Patient taking differently: Take 650 mg by mouth every 4 (four) hours as needed for moderate pain. 12/24/18  Yes Gouru, Illene Silver, MD  albuterol (PROVENTIL HFA;VENTOLIN HFA) 108 (90 Base) MCG/ACT inhaler Inhale 2 puffs into the lungs every 6 (six) hours as needed for wheezing or shortness of breath. 04/24/18  Yes Demetrios Loll, MD  amiodarone (PACERONE) 200 MG tablet Take 1 tablet (200 mg total) by mouth 2 (two) times daily. 11/24/21  Yes Herbert Pun, MD  apixaban (ELIQUIS) 2.5 MG TABS tablet Take 2.5 mg by mouth 2 (two) times daily. 01/11/21  Yes [provider]  atorvastatin (LIPITOR) 40 MG tablet Take 1 tablet (40 mg total) by mouth daily. 02/03/22  Yes Nicole Kindred A, DO  calcium carbonate (TUMS - DOSED IN MG ELEMENTAL CALCIUM) 500 MG chewable tablet Chew 1 tablet by mouth 2 (two) times daily as needed.   Yes [provider]  citalopram (CELEXA) 20 MG tablet Take 1 tablet (20 mg total) by mouth daily. 02/04/22  Yes Nicole Kindred A, DO  ferrous sulfate 325 (65 FE) MG tablet Take 325 mg by mouth daily with breakfast.   Yes [provider]  Fluticasone-Umeclidin-Vilant (TRELEGY ELLIPTA) 100-62.5-25 MCG/ACT AEPB Inhale 1 puff into the lungs daily. 03/05/22  Yes [provider]  metoprolol tartrate (LOPRESSOR) 25 MG tablet Take 1 tablet (25 mg total) by mouth 2 (two) times daily. 02/03/22  Yes Nicole Kindred A, DO  OXYGEN Inhale 3 L into the lungs as needed  (shortness of breath).   Yes [provider]  pantoprazole (PROTONIX) 40 MG tablet Take 1 tablet (40 mg total) by mouth 2 (two) times daily before a meal. 08/05/20  Yes Pokhrel, Laxman, MD  sodium bicarbonate 650 MG tablet Take 1,300 mg by mouth daily.   Yes [provider]  torsemide 40 MG TABS Take 40 mg by mouth daily. 02/04/22  Yes Nicole Kindred A, DO  zolpidem (AMBIEN) 5 MG tablet Take 5 mg by mouth at bedtime as needed for sleep. 06/23/21  Yes [provider]  midodrine (PROAMATINE) 5 MG tablet Take 1 tablet (5 mg total) by mouth 3 (three) times daily with meals. Patient not taking: Reported on 04/12/2022 11/24/21   Herbert Pun, MD  Multiple Vitamin (MULTIVITAMIN WITH MINERALS) TABS tablet Take 1 tablet by mouth daily. Patient not taking: Reported on 04/12/2022  02/04/22   Ezekiel Slocumb, DO  Nutritional Supplements (FEEDING SUPPLEMENT, NEPRO CARB STEADY,) LIQD Take 237 mLs by mouth 3 (three) times daily between meals. Patient not taking: Reported on 04/12/2022 02/03/22   Ezekiel Slocumb, DO    Review of Systems  Constitutional:  Positive for appetite change (decreased) and fatigue (worsening).  HENT:  Negative for congestion, rhinorrhea and sore throat.   Eyes: Negative.   Respiratory:  Positive for cough (dry), chest tightness, shortness of breath (with moderate exertion) and wheezing.   Cardiovascular:  Negative for chest pain, palpitations and leg swelling.  Gastrointestinal:  Positive for abdominal distention (upper abdomen). Negative for abdominal pain.  Endocrine: Negative.   Genitourinary: Negative.   Musculoskeletal:  Negative for back pain and neck pain.  Skin: Negative.   Allergic/Immunologic: Negative.   Neurological:  Positive for headaches. Negative for dizziness and light-headedness.  Hematological:  Negative for adenopathy. Does not bruise/bleed easily.  Psychiatric/Behavioral:  Negative for dysphoric mood and sleep disturbance  (sleeping on 1 pillow with oxygen @ 3L). The patient is not nervous/anxious.    Vitals:   04/12/22 1355  BP: 113/76  Pulse: 74  Resp: 18  SpO2: 90%  Weight: 116 lb (52.6 kg)  Height: _0  (1.651 m)   Wt Readings from Last 3 Encounters:  04/12/22 116 lb (52.6 kg)  03/10/22 112 lb 8 oz (51 kg)  03/07/22 115 lb 2 oz (52.2 kg)   Lab Results  Component Value Date   CREATININE 1.54 (H) 03/11/2022   CREATININE 2.01 (H) 03/10/2022   CREATININE 2.42 (H) 03/09/2022   Physical Exam Vitals and nursing note reviewed.  Constitutional:      Appearance: She is well-developed.  HENT:     Head: Normocephalic and atraumatic.  Neck:     Vascular: No JVD.  Cardiovascular:     Rate and Rhythm: Normal rate and regular rhythm.  Pulmonary:     Effort: Pulmonary effort is normal. No respiratory distress.     Breath sounds: No wheezing or rales.  Abdominal:     General: There is distension.     Palpations: Abdomen is soft.  Musculoskeletal:        General: No tenderness.     Cervical back: Normal range of motion and neck supple.     Right lower leg: No tenderness. Edema (trace pitting) present.     Left lower leg: No tenderness. Edema (trace pitting) present.  Skin:    General: Skin is warm and dry.  Neurological:     Mental Status: She is alert and oriented to person, place, and time.  Psychiatric:        Behavior: Behavior normal.    Assessment & Plan:  1: Chronic heart failure with reduced ejection fraction- - NYHA class II - euvolemic today - weighing daily; reminded to call for an overnight weight gain of >2 pounds or a weekly weight gain of >5 pounds - weight stable from last visit here 1 month ago - on GDMT of metoprolol (although tartrate) - discuss changing her metoprolol to succinate but she was already confused about her metoprolol and had been taking both doses so will defer this until next time - advised to only take 12.56m BID (1/2 tablet) and since the mg on both  bottles were the same, combined the bottles so there wouldn't be anymore confusion - hoping her symptoms improve since she won't be taking so much metoprolol - consider SGLT2 if renal function continues to look  good - drinking 36 ounces of water and 10 ounces of coffee daily; drinks 36 ounces of beer over the weekends - not adding salt but does eat out often; reviewed how to make low sodium choices when eating out - BNP 02/21/22 was 1059.9  2: HTN- - BP looks good (113/76) - sees PCP (@ [facility])  - BMP reviewed 03/06/22 and showed sodium 135, potassium 3.6, creatinine 1.54 and GFR 37 - saw nephrology Holley Raring) 02/21/22; returns next week  3: Atrial fibrillation- - saw cardiology Petra Kuba) 02/08/22; returns tomorrow - previous successful cardioversion  4: COPD- - saw pulmonology Raul Del) 03/15/22 - using trelegy and albuterol - wearing oxygen at 3L around the clock   Medication bottles reviewed.   Return in 1 month, sooner if needed.

## 2022-04-13 ENCOUNTER — Other Ambulatory Visit: Payer: Self-pay

## 2022-04-13 ENCOUNTER — Emergency Department
Admission: EM | Admit: 2022-04-13 | Discharge: 2022-04-13 | Disposition: A | Discharge status: Home / Self Care | Payer: Medicare Other | Attending: Emergency Medicine | Admitting: Emergency Medicine

## 2022-04-13 ENCOUNTER — Encounter: Payer: Self-pay | Admitting: Family

## 2022-04-13 ENCOUNTER — Emergency Department: Payer: Medicare Other

## 2022-04-13 DIAGNOSIS — J449 Chronic obstructive pulmonary disease, unspecified: Secondary | ICD-10-CM | POA: Insufficient documentation

## 2022-04-13 DIAGNOSIS — N189 Chronic kidney disease, unspecified: Secondary | ICD-10-CM | POA: Insufficient documentation

## 2022-04-13 DIAGNOSIS — I509 Heart failure, unspecified: Secondary | ICD-10-CM | POA: Diagnosis not present

## 2022-04-13 DIAGNOSIS — J811 Chronic pulmonary edema: Secondary | ICD-10-CM | POA: Insufficient documentation

## 2022-04-13 DIAGNOSIS — R1013 Epigastric pain: Secondary | ICD-10-CM | POA: Diagnosis present

## 2022-04-13 LAB — CBC
HCT: 32.6 % — ABNORMAL LOW (ref 36.0–46.0)
Hemoglobin: 10.4 g/dL — ABNORMAL LOW (ref 12.0–15.0)
MCH: 31 pg (ref 26.0–34.0)
MCHC: 31.9 g/dL (ref 30.0–36.0)
MCV: 97 fL (ref 80.0–100.0)
Platelets: 216 10*3/uL (ref 150–400)
RBC: 3.36 MIL/uL — ABNORMAL LOW (ref 3.87–5.11)
RDW: 15.2 % (ref 11.5–15.5)
WBC: 5.8 10*3/uL (ref 4.0–10.5)
nRBC: 0 % (ref 0.0–0.2)

## 2022-04-13 LAB — HEPATIC FUNCTION PANEL
ALT: 18 U/L (ref 0–44)
AST: 31 U/L (ref 15–41)
Albumin: 4 g/dL (ref 3.5–5.0)
Alkaline Phosphatase: 60 U/L (ref 38–126)
Bilirubin, Direct: 0.2 mg/dL (ref 0.0–0.2)
Indirect Bilirubin: 0.7 mg/dL (ref 0.3–0.9)
Total Bilirubin: 0.9 mg/dL (ref 0.3–1.2)
Total Protein: 7.5 g/dL (ref 6.5–8.1)

## 2022-04-13 LAB — BASIC METABOLIC PANEL
Anion gap: 14 (ref 5–15)
BUN: 34 mg/dL — ABNORMAL HIGH (ref 8–23)
CO2: 25 mmol/L (ref 22–32)
Calcium: 9 mg/dL (ref 8.9–10.3)
Chloride: 99 mmol/L (ref 98–111)
Creatinine, Ser: 1.67 mg/dL — ABNORMAL HIGH (ref 0.44–1.00)
GFR, Estimated: 33 mL/min — ABNORMAL LOW (ref >60)
Glucose, Bld: 73 mg/dL (ref 70–99)
Potassium: 4.1 mmol/L (ref 3.5–5.1)
Sodium: 138 mmol/L (ref 135–145)

## 2022-04-13 LAB — TROPONIN I (HIGH SENSITIVITY)
Troponin I (High Sensitivity): 48 ng/L — ABNORMAL HIGH (ref <18)
Troponin I (High Sensitivity): 54 ng/L — ABNORMAL HIGH (ref <18)

## 2022-04-13 LAB — BRAIN NATRIURETIC PEPTIDE: B Natriuretic Peptide: 2776.3 pg/mL — ABNORMAL HIGH (ref 0.0–100.0)

## 2022-04-13 LAB — LIPASE, BLOOD: Lipase: 24 U/L (ref 11–51)

## 2022-04-13 MED ORDER — IOHEXOL 300 MG/ML  SOLN
60.0000 mL | Freq: Once | INTRAMUSCULAR | Status: AC | PRN
  Administered 2022-04-13: 60 mL via INTRAVENOUS

## 2022-04-13 MED ORDER — FUROSEMIDE 10 MG/ML IJ SOLN
80.0000 mg | Freq: Once | INTRAMUSCULAR | Status: AC
  Administered 2022-04-13: 80 mg via INTRAVENOUS
  Filled 2022-04-13: qty 8

## 2022-04-13 NOTE — ED Notes (Signed)
Pt placed in lobby for d/c, pt states friend is on the way. First nurse made aware

## 2022-04-13 NOTE — ED Triage Notes (Signed)
Pt from [facility] clinic- states PCP heard crackles and read low oxygen (46% w/out o2) and was concerned for fluid buildup, pt c/o chest pressure x3 days. Pt uses 3 liter's chonically. Pt 93% on 3 liter's currently. Pt is AOX4, NAD noted.

## 2022-04-13 NOTE — ED Provider Notes (Signed)
[facility] Provider Note    Event Date/Time   First MD Initiated Contact with Patient 04/13/22 1241     (approximate)   History   Chief Complaint: Chest Pain   HPI  Danielle [name] is a 66 y.o. female with a history of COPD, CKD, atrial flutter, CHF who comes ED complaining of epigastric pain for the past 3 days, nonradiating, no aggravating or alleviating factors.  No vomiting diarrhea or constipation.  Denies chest pain.  She also reports shortness of breath and orthopnea.  She notes that she has had 3 or 4 pound weight gain over the past week.     Physical Exam   Triage Vital Signs: ED Triage Vitals  Enc Vitals Group     BP 04/13/22 1125 (!) 165/86     Pulse Rate 04/13/22 1125 85     Resp 04/13/22 1125 19     Temp 04/13/22 1125 97.6 F (36.4 C)     Temp Source 04/13/22 1125 Oral     SpO2 04/13/22 1125 93 %     Weight 04/13/22 1247 115 lb 15.4 oz (52.6 kg)     Height 04/13/22 1247 5\' 5"  (1.651 m)     Head Circumference --      Peak Flow --      Pain Score 04/13/22 1125 0     Pain Loc --      Pain Edu? --      Excl. in [place]? --     Most recent vital signs: Vitals:   04/13/22 1400 04/13/22 1430  BP: (!) 151/91 (!) 158/77  Pulse: 87 76  Resp: (!) 24 (!) 24  Temp:    SpO2: 93% 96%    General: Awake, no distress.  CV:  Good peripheral perfusion.  Regular rate rhythm Resp:  Normal effort.  Clear to auscultation bilaterally.  No wheezing, no crackles. Abd:  No distention.  Soft with epigastric tenderness and to a lesser degree diffuse abdominal tenderness.  No rigidity or guarding. Other:  Trace pitting edema bilateral lower extremities.  Moist mucosa   ED Results / Procedures / Treatments   Labs (all labs ordered are listed, but only abnormal results are displayed) Labs Reviewed  BASIC METABOLIC PANEL - Abnormal; Notable for the following components:      Result Value   BUN 34 (*)    Creatinine, Ser 1.67 (*)    GFR, Estimated  33 (*)    All other components within normal limits  CBC - Abnormal; Notable for the following components:   RBC 3.36 (*)    Hemoglobin 10.4 (*)    HCT 32.6 (*)    All other components within normal limits  BRAIN NATRIURETIC PEPTIDE - Abnormal; Notable for the following components:   B Natriuretic Peptide 2,776.3 (*)    All other components within normal limits  TROPONIN I (HIGH SENSITIVITY) - Abnormal; Notable for the following components:   Troponin I (High Sensitivity) 48 (*)    All other components within normal limits  TROPONIN I (HIGH SENSITIVITY) - Abnormal; Notable for the following components:   Troponin I (High Sensitivity) 54 (*)    All other components within normal limits  LIPASE, BLOOD  HEPATIC FUNCTION PANEL     EKG Interpreted by me Sinus rhythm rate of 67.  Right axis, normal intervals.  Poor R wave progression.  Normal ST segments and T waves, no ischemic changes.   RADIOLOGY Chest x-ray interpreted by me, shows  bilateral basilar infiltrates consistent with edema.  Radiology report reviewed.  CT abdomen pelvis pending   PROCEDURES:  Procedures   MEDICATIONS ORDERED IN ED: Medications  furosemide (LASIX) injection 80 mg (80 mg Intravenous Given 04/13/22 1413)  iohexol (OMNIPAQUE) 300 MG/ML solution 60 mL (60 mLs Intravenous Contrast Given 04/13/22 1527)     IMPRESSION / MDM / ASSESSMENT AND PLAN / ED COURSE  I reviewed the triage vital signs and the nursing notes.                              Differential diagnosis includes, but is not limited to, CHF exacerbation, pneumonia, pleural effusion, pancreatitis, GERD, cholecystitis, appendicitis, diverticulitis/intra-abdominal abscess  Patient's presentation is most consistent with acute presentation with potential threat to life or bodily function.  Patient presents with epigastric pain with abdominal tenderness.  She has a complicated abdominal history including colostomy reversal.  Will obtain CT scan  to further evaluate.  Patient also complains of 3 or 4 pound weight gain over the past week and has some symptoms and clinical exam findings of CHF.  We will give a dose of IV Lasix.  No respiratory distress, no hypoxia, so I think if she is symptomatically improved after some diuresis in the ED, and CT scan and belly labs are reassuring, patient would not require admission and can be discharged outpatient follow-up.       FINAL CLINICAL IMPRESSION(S) / ED DIAGNOSES   Final diagnoses:  Acute on chronic congestive heart failure, unspecified heart failure type ([place])  Epigastric pain     Rx / DC Orders   ED Discharge Orders     None        Note:  This document was prepared using Dragon voice recognition software and may include unintentional dictation errors.   Carrie Mew, MD 04/13/22 1536

## 2022-04-13 NOTE — ED Provider Triage Note (Signed)
Emergency Medicine Provider Triage Evaluation Note  Danielle [name] , a 66 y.o. female  was evaluated in triage.  Pt complains of epigastric pressure/chest pressure x 3 days. Evaluated at [facility] and sent to the ER for hypoxia. Patient states she was not wearing her prescribed home oxygen at that time. She was also advised that the provider heard "crackles" in her lungs.  Physical Exam  BP (!) 165/86 (BP Location: Right Arm)   Pulse 85   Temp 97.6 F (36.4 C) (Oral)   Resp 19   SpO2 93%  Gen:   Awake, no distress   Resp:  Normal effort  MSK:   Moves extremities without difficulty  Other:    Medical Decision Making  Medically screening exam initiated at 11:26 AM.  Appropriate orders placed.  Danielle [name] was informed that the remainder of the evaluation will be completed by another provider, this initial triage assessment does not replace that evaluation, and the importance of remaining in the ED until their evaluation is complete.  Oxygen saturation 93% on prescribed home oxygen 3 liters. No respiratory distress. Protocols started in triage.   [name], FNP 04/13/22 1129

## 2022-04-13 NOTE — Discharge Instructions (Addendum)
For the next 2 days (Saturday and Sunday) please take double your torsemide: Take 2 tablets twice per day, then after this on Monday go back to your normal dosing.  Continue all of your other medications.

## 2022-04-13 NOTE — ED Provider Notes (Signed)
Patient received in signout from Dr. Joni Fears pending CT abdomen/pelvis and reassessment after IV diuresis.  She presents with signs of CHF exacerbation.  She reports having 2 large voids and feeling a little bit better.  CT scan abdomen/pelvis is interpreted by me with no evidence of acute intra-abdominal pathology.  We discussed increased diuresis at home as well as close follow-up with CHF clinic referral.  We discussed return precautions for the ED.  She is suitable for our outpatient management per original plan of care.   Vladimir Crofts, MD 04/13/22 [phone]

## 2022-04-29 DIAGNOSIS — I4891 Unspecified atrial fibrillation: Secondary | ICD-10-CM | POA: Diagnosis not present

## 2022-04-29 DIAGNOSIS — Z85118 Personal history of other malignant neoplasm of bronchus and lung: Secondary | ICD-10-CM | POA: Diagnosis not present

## 2022-04-29 DIAGNOSIS — I130 Hypertensive heart and chronic kidney disease with heart failure and stage 1 through stage 4 chronic kidney disease, or unspecified chronic kidney disease: Secondary | ICD-10-CM | POA: Diagnosis not present

## 2022-04-29 DIAGNOSIS — R002 Palpitations: Secondary | ICD-10-CM | POA: Diagnosis present

## 2022-04-29 DIAGNOSIS — N184 Chronic kidney disease, stage 4 (severe): Secondary | ICD-10-CM | POA: Insufficient documentation

## 2022-04-29 DIAGNOSIS — Z79899 Other long term (current) drug therapy: Secondary | ICD-10-CM | POA: Insufficient documentation

## 2022-04-29 DIAGNOSIS — J449 Chronic obstructive pulmonary disease, unspecified: Secondary | ICD-10-CM | POA: Insufficient documentation

## 2022-04-29 DIAGNOSIS — N179 Acute kidney failure, unspecified: Secondary | ICD-10-CM | POA: Diagnosis not present

## 2022-04-29 DIAGNOSIS — I5043 Acute on chronic combined systolic (congestive) and diastolic (congestive) heart failure: Secondary | ICD-10-CM | POA: Insufficient documentation

## 2022-04-29 DIAGNOSIS — Z8616 Personal history of COVID-19: Secondary | ICD-10-CM | POA: Diagnosis not present

## 2022-04-29 DIAGNOSIS — Z7901 Long term (current) use of anticoagulants: Secondary | ICD-10-CM | POA: Diagnosis not present

## 2022-04-29 DIAGNOSIS — Z853 Personal history of malignant neoplasm of breast: Secondary | ICD-10-CM | POA: Insufficient documentation

## 2022-04-29 NOTE — ED Notes (Signed)
Patient to waiting room via wheelchair by EMS from home.  Per EMS patient with known history of Afib and is in Afib now rate 97-115.  Bp 115/80.  Wants to just get checked out.

## 2022-04-30 ENCOUNTER — Other Ambulatory Visit: Payer: Self-pay

## 2022-04-30 ENCOUNTER — Ambulatory Visit: Payer: Medicare Other | Admitting: Internal Medicine

## 2022-04-30 ENCOUNTER — Inpatient Hospital Stay: Payer: Medicare Other | Admitting: Medical Oncology

## 2022-04-30 ENCOUNTER — Inpatient Hospital Stay: Payer: Medicare Other

## 2022-04-30 ENCOUNTER — Encounter: Payer: Self-pay | Admitting: Internal Medicine

## 2022-04-30 ENCOUNTER — Emergency Department: Payer: Medicare Other

## 2022-04-30 ENCOUNTER — Emergency Department
Admission: EM | Admit: 2022-04-30 | Discharge: 2022-04-30 | Disposition: A | Discharge status: Home / Self Care | Payer: Medicare Other | Attending: Emergency Medicine | Admitting: Emergency Medicine

## 2022-04-30 DIAGNOSIS — I4891 Unspecified atrial fibrillation: Secondary | ICD-10-CM

## 2022-04-30 DIAGNOSIS — N179 Acute kidney failure, unspecified: Secondary | ICD-10-CM

## 2022-04-30 DIAGNOSIS — R002 Palpitations: Secondary | ICD-10-CM

## 2022-04-30 LAB — TROPONIN I (HIGH SENSITIVITY)
Troponin I (High Sensitivity): 15 ng/L (ref <18)
Troponin I (High Sensitivity): 17 ng/L (ref <18)

## 2022-04-30 LAB — BASIC METABOLIC PANEL
Anion gap: 12 (ref 5–15)
BUN: 37 mg/dL — ABNORMAL HIGH (ref 8–23)
CO2: 25 mmol/L (ref 22–32)
Calcium: 8.4 mg/dL — ABNORMAL LOW (ref 8.9–10.3)
Chloride: 96 mmol/L — ABNORMAL LOW (ref 98–111)
Creatinine, Ser: 2.14 mg/dL — ABNORMAL HIGH (ref 0.44–1.00)
GFR, Estimated: 25 mL/min — ABNORMAL LOW (ref >60)
Glucose, Bld: 175 mg/dL — ABNORMAL HIGH (ref 70–99)
Potassium: 3.6 mmol/L (ref 3.5–5.1)
Sodium: 133 mmol/L — ABNORMAL LOW (ref 135–145)

## 2022-04-30 LAB — CBC
HCT: 26.7 % — ABNORMAL LOW (ref 36.0–46.0)
Hemoglobin: 8.6 g/dL — ABNORMAL LOW (ref 12.0–15.0)
MCH: 31.5 pg (ref 26.0–34.0)
MCHC: 32.2 g/dL (ref 30.0–36.0)
MCV: 97.8 fL (ref 80.0–100.0)
Platelets: 198 10*3/uL (ref 150–400)
RBC: 2.73 MIL/uL — ABNORMAL LOW (ref 3.87–5.11)
RDW: 14.6 % (ref 11.5–15.5)
WBC: 4.8 10*3/uL (ref 4.0–10.5)
nRBC: 0 % (ref 0.0–0.2)

## 2022-04-30 LAB — T4, FREE: Free T4: 1.15 ng/dL — ABNORMAL HIGH (ref 0.61–1.12)

## 2022-04-30 LAB — TSH: TSH: 0.439 u[IU]/mL (ref 0.350–4.500)

## 2022-04-30 MED ORDER — SODIUM CHLORIDE 0.9 % IV BOLUS
250.0000 mL | Freq: Once | INTRAVENOUS | Status: AC
  Administered 2022-04-30: 250 mL via INTRAVENOUS

## 2022-04-30 MED ORDER — METOPROLOL TARTRATE 5 MG/5ML IV SOLN
2.5000 mg | Freq: Once | INTRAVENOUS | Status: AC
Start: 2022-04-30 — End: 2022-04-30
  Administered 2022-04-30: 2.5 mg via INTRAVENOUS
  Filled 2022-04-30: qty 5

## 2022-04-30 NOTE — Discharge Instructions (Signed)
Take your medicines as you normally do this morning.  Avoid caffeine whenever possible.  Return to the ER for worsening symptoms, persistent vomiting, difficulty breathing or other concerns.

## 2022-04-30 NOTE — ED Triage Notes (Signed)
Patient reports felt herself go into Afib (has history of same) and heaviness in her chest..

## 2022-04-30 NOTE — ED Provider Notes (Signed)
[facility] Provider Note    Event Date/Time   First MD Initiated Contact with Patient 04/30/22 [phone]     (approximate)   History   Irregular Heart Beat   HPI  Danielle [name] is a 66 y.o. female who presents to the ED from home with a chief complaint of palpitations.  Patient with a history of atrial fibrillation on Eliquis who felt herself go into atrial fibrillation prior to arrival.  Endorses associated chest tightness.  Endorses medication compliance.  Denies recent fever, cough, abdominal pain, nausea, vomiting or dizziness.     Past Medical History   Past Medical History:  Diagnosis Date  . Anemia   . Anxiety   . Aortic atherosclerosis ([place])   . Aortic valve stenosis 02/10/2018   a.) TTE 02/10/2018: EF 55-60%: mild AS with MPG of 12 mmHg. b.) TTE 04/21/2018: EF 35-40%; mild AS with MPG 8 mmHg. c.) TTE 11/07/2019: EF 50-55%; mild AS with MPG 10 mmHg.  . Arthritis   . Atrial fibrillation ([place])    a.) CHA2DS2-VASc = 4 (age, sex, HTN, aortic plaque). b.) Rate/rhythm maintained on oral carvedilol; chronically anticoagulated on dose reduced apixaban. c.)  Attempted deployment of LAA occlusive device on 01/11/2021; parameters failed and procedure aborted.  . Breast cancer, left ([place]) 2000   a.) T2N1M0; ER/PR (+) --> Tx'd with total mastectomy, LN resection, XRT, and chemotherapy  . Cancer of right lung ([place]) 07/30/2016   a.) adenocarcinoma; ALK, ROS1, PDL1, BRAF, EGFR all negative.  . Chronic diastolic CHF (congestive heart failure) ([place]) 11/26/2021  . CKD (chronic kidney disease), stage IV ([place])   . COPD (chronic obstructive pulmonary disease) ([place])   . Dependence on supplemental oxygen   . Depression   . Diastolic dysfunction 38/93/7342   a.) TTE 02/10/2017: EF 55-60%; G2DD. b.) TTE 04/21/2018: EF 35-40%; mild LA dilation; mod MV regurgitation. c.) TTE 11/07/2019: EF 50-55%; G1DD.  [name] DOE (dyspnea on exertion)   . GERD (gastroesophageal reflux  disease)   . Heart murmur   . History of 2019 novel coronavirus disease (COVID-19) 10/14/2019  . HLD (hyperlipidemia)   . Hypertension   . Long term current use of anticoagulant    a.) apixaban  . Lymphedema   . Personal history of chemotherapy   . Personal history of radiation therapy   . SBO (small bowel obstruction) ([place]) 11/05/2020  . Vitamin D deficiency      Active Problem List   Patient Active Problem List   Diagnosis Date Noted  . Paroxysmal atrial fibrillation ([place]) 03/10/2022  . Dyslipidemia 03/10/2022  . Essential hypertension 03/10/2022  . Chronic obstructive pulmonary disease (COPD) ([place]) 03/10/2022  . Atrial fibrillation with RVR ([place]) 02/22/2022  . Sore throat 02/02/2022  . TIA (transient ischemic attack) 01/29/2022  . Epistaxis 01/27/2022  . Pleural effusion 01/26/2022  . Known medical problems 01/26/2022  . C. difficile colitis 11/27/2021  . Abdominal pain 11/26/2021  . HLD (hyperlipidemia) 11/26/2021  . Chronic diastolic CHF (congestive heart failure) ([place]) 11/26/2021  . Iron deficiency anemia 11/26/2021  . Aspiration pneumonia of right lower lobe due to gastric secretions ([place])   . Ileus following gastrointestinal surgery ([place])   . Hypomagnesemia   . Anemia of chronic disease   . Hyperphosphatemia 10/24/2021  . Hypotension 11/17/2020  . Ileostomy status ([place]) 11/17/2020  . Acute shoulder pain due to trauma, left 11/17/2020  . Syncope 11/17/2020  . COPD with acute exacerbation ([place]) 08/01/2020  . Atrial fibrillation with  rapid ventricular response ([place]) 08/01/2020  . Cellulitis of left arm 01/20/2020  . CKD (chronic kidney disease), stage IV ([place]) 01/20/2020  . Hypokalemia 01/20/2020  . Elevated troponin 01/20/2020  . Sepsis ([place]) 01/20/2020  . Acute on chronic combined systolic and diastolic CHF (congestive heart failure) ([place]) 01/20/2020  . Pleural effusion due to CHF (congestive heart failure) ([place]) 11/06/2019  . Diarrhea   . Acute renal  failure superimposed on stage 3b chronic kidney disease ([place]) 10/29/2019  . GERD (gastroesophageal reflux disease) 10/29/2019  . Depression 10/29/2019  . Hyperkalemia 10/14/2019  . History of 2019 novel coronavirus disease (COVID-19) 10/14/2019  . Acute respiratory failure due to COVID-19 ([place]) 09/27/2019  . Respiratory tract infection due to COVID-19 virus 09/27/2019  . Acute blood loss anemia 06/20/2019  . Normocytic anemia 06/20/2019  . GI bleeding 06/19/2019  . Anemia of chronic kidney failure, stage 4 (severe) ([place]) 06/12/2019  . 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 Acute on chronic respiratory failure with hypoxia and hypercapnia ([place]) 12/22/2018  . Ileostomy dysfunction ([place]) 09/28/2018  . Reflux esophagitis   . Iron deficiency anemia secondary to blood loss (chronic)   . SBO (small bowel obstruction) ([place]) 06/29/2018  . Chronic systolic heart failure ([place]) 06/09/2018  . HTN (hypertension) 06/09/2018  . Atrial flutter ([place]) 06/09/2018  . Lymphedema 06/09/2018  . Acute kidney injury superimposed on CKD ([place]) 06/04/2018  . Severe protein-calorie malnutrition ([place]) 06/04/2018  . Blood in stool   . Focal (segmental) acute (reversible) ischemia of large intestine ([place])   . Ulceration of intestine   . Diverticulosis of large intestine without diverticulitis   . Abnormal CT scan, colon   . Colitis   . COPD exacerbation ([place])   . Malnutrition of moderate degree 04/23/2018  . Palliative care by specialist   . DNR (do not resuscitate) discussion   . Weakness   . Acute on chronic respiratory failure with hypoxia ([place]) 04/21/2018  . Hyponatremia 02/10/2017  . Syncope and collapse 02/09/2017  . Lung nodule, solitary 07/25/2016  . Malignant neoplasm of upper lobe of right lung ([place]) 07/25/2016  . Carcinoma of overlapping sites of left breast in female, estrogen receptor positive ([place]) 06/22/2016  . Multiple lung nodules 06/22/2016  . Lesion of right  lung   . Breast cancer in female [facility]) 02/21/2016  . Moderate COPD (chronic obstructive pulmonary disease) ([place]) 08/24/2014     Past Surgical History   Past Surgical History:  Procedure Laterality Date  . Breast Biospy Left    [facility]  . BREAST SURGERY    . CARDIOVERSION N/A 02/23/2022   Procedure: CARDIOVERSION;  Surgeon: Corey Skains, MD;  Location: [facility] ORS;  Service: Cardiovascular;  Laterality: N/A;  . COLONOSCOPY N/A 04/30/2018   Procedure: COLONOSCOPY;  Surgeon: Virgel Manifold, MD;  Location: [facility] ENDOSCOPY;  Service: Endoscopy;  Laterality: N/A;  . COLONOSCOPY N/A 07/22/2018   Procedure: COLONOSCOPY;  Surgeon: Virgel Manifold, MD;  Location: [facility] ENDOSCOPY;  Service: Endoscopy;  Laterality: N/A;  . COLONOSCOPY WITH PROPOFOL N/A 09/21/2021   Procedure: COLONOSCOPY WITH PROPOFOL;  Surgeon: Benjamine Sprague, DO;  Location: [facility] ENDOSCOPY;  Service: General;  Laterality: N/A;  . COLONOSCOPY WITH PROPOFOL N/A 03/11/2022   Procedure: COLONOSCOPY WITH PROPOFOL;  Surgeon: Annamaria Helling, DO;  Location: [facility] ENDOSCOPY;  Service: Gastroenterology;  Laterality: N/A;  . DILATION AND CURETTAGE OF UTERUS    . ELECTROMAGNETIC NAVIGATION BROCHOSCOPY Right 04/11/2016   Procedure: ELECTROMAGNETIC NAVIGATION BRONCHOSCOPY;  Surgeon:  Vilinda Boehringer, MD;  Location: [facility] ORS;  Service: Cardiopulmonary;  Laterality: Right;  . ESOPHAGOGASTRODUODENOSCOPY N/A 07/22/2018   Procedure: ESOPHAGOGASTRODUODENOSCOPY (EGD);  Surgeon: Virgel Manifold, MD;  Location: [facility] ENDOSCOPY;  Service: Endoscopy;  Laterality: N/A;  . ESOPHAGOGASTRODUODENOSCOPY (EGD) WITH PROPOFOL N/A 05/07/2018   Procedure: ESOPHAGOGASTRODUODENOSCOPY (EGD) WITH PROPOFOL;  Surgeon: Lucilla Lame, MD;  Location: [facility] ENDOSCOPY;  Service: Endoscopy;  Laterality: N/A;  . ESOPHAGOGASTRODUODENOSCOPY (EGD) WITH PROPOFOL N/A 04/24/2019   Procedure: ESOPHAGOGASTRODUODENOSCOPY (EGD) WITH PROPOFOL;  Surgeon: Jonathon Bellows, MD;   Location: [facility] ENDOSCOPY;  Service: Gastroenterology;  Laterality: N/A;  . ESOPHAGOGASTRODUODENOSCOPY (EGD) WITH PROPOFOL N/A 01/12/2020   Procedure: ESOPHAGOGASTRODUODENOSCOPY (EGD) WITH PROPOFOL;  Surgeon: Jonathon Bellows, MD;  Location: [facility] ENDOSCOPY;  Service: Gastroenterology;  Laterality: N/A;  . ESOPHAGOGASTRODUODENOSCOPY (EGD) WITH PROPOFOL N/A 04/28/2020   Procedure: ESOPHAGOGASTRODUODENOSCOPY (EGD) WITH PROPOFOL;  Surgeon: Jonathon Bellows, MD;  Location: [facility] ENDOSCOPY;  Service: Gastroenterology;  Laterality: N/A;  . ESOPHAGOGASTRODUODENOSCOPY (EGD) WITH PROPOFOL N/A 03/11/2022   Procedure: ESOPHAGOGASTRODUODENOSCOPY (EGD) WITH PROPOFOL;  Surgeon: Annamaria Helling, DO;  Location: [facility] ENDOSCOPY;  Service: Gastroenterology;  Laterality: N/A;  . EUS N/A 05/07/2019   Procedure: FULL UPPER ENDOSCOPIC ULTRASOUND (EUS) RADIAL;  Surgeon: Jola Schmidt, MD;  Location: [facility] ENDOSCOPY;  Service: Endoscopy;  Laterality: N/A;  . ILEOSCOPY N/A 07/22/2018   Procedure: ILEOSCOPY THROUGH STOMA;  Surgeon: Virgel Manifold, MD;  Location: [facility] ENDOSCOPY;  Service: Endoscopy;  Laterality: N/A;  . ILEOSTOMY    . ILEOSTOMY N/A 09/08/2018   Procedure: ILEOSTOMY REVISION POSSIBLE CREATION;  Surgeon: Herbert Pun, MD;  Location: [facility] ORS;  Service: General;  Laterality: N/A;  . ILEOSTOMY CLOSURE N/A 08/15/2018   Procedure: DILATION OF ILEOSTOMY STRICTURE;  Surgeon: Herbert Pun, MD;  Location: [facility] ORS;  Service: General;  Laterality: N/A;  . LAPAROTOMY Right 05/04/2018   Procedure: EXPLORATORY LAPAROTOMY right colectomy right and left ostomy;  Surgeon: Herbert Pun, MD;  Location: [facility] ORS;  Service: General;  Laterality: Right;  . LEFT ATRIAL APPENDAGE OCCLUSION N/A 01/11/2021   Procedure: LEFT ATRIAL APPENDAGE OCCLUSION ([place]); ABORTED PROCEDURE WITHOUT DEVICE BEING IMPLANTED; Location: [facility]; Surgeon: Mylinda Latina, MD  . LUNG BIOPSY    . MASTECTOMY Left    2000,  [facility]  . ROTATOR CUFF REPAIR Right    [facility]  . XI ROBOTIC ASSISTED COLOSTOMY TAKEDOWN N/A 10/23/2021   Procedure: XI ROBOTIC ASSISTED ILEOSTOMY TAKEDOWN;  Surgeon: Herbert Pun, MD;  Location: [facility] ORS;  Service: General;  Laterality: N/A;  180 minutes for the surgery part please     Home Medications   Prior to Admission medications   Medication Sig Start Date End Date Taking? Authorizing Provider  acetaminophen (TYLENOL) 325 MG tablet Take 2 tablets (650 mg total) by mouth every 6 (six) hours as needed for mild pain (or Fever >/= 101). Patient taking differently: Take 650 mg by mouth every 4 (four) hours as needed for moderate pain. 12/24/18   Gouru, Illene Silver, MD  albuterol (PROVENTIL HFA;VENTOLIN HFA) 108 (90 Base) MCG/ACT inhaler Inhale 2 puffs into the lungs every 6 (six) hours as needed for wheezing or shortness of breath. 04/24/18   Demetrios Loll, MD  amiodarone (PACERONE) 200 MG tablet Take 1 tablet (200 mg total) by mouth 2 (two) times daily. 11/24/21   Herbert Pun, MD  apixaban (ELIQUIS) 2.5 MG TABS tablet Take 2.5 mg by mouth 2 (two) times daily. 01/11/21   [provider]  atorvastatin (LIPITOR) 40 MG tablet Take 1 tablet (40 mg total) by mouth  daily. 02/03/22   Ezekiel Slocumb, DO  calcium carbonate (TUMS - DOSED IN MG ELEMENTAL CALCIUM) 500 MG chewable tablet Chew 1 tablet by mouth 2 (two) times daily as needed.    [provider]  citalopram (CELEXA) 20 MG tablet Take 1 tablet (20 mg total) by mouth daily. 02/04/22   Nicole Kindred A, DO  ferrous sulfate 325 (65 FE) MG tablet Take 325 mg by mouth daily with breakfast.    [provider]  Fluticasone-Umeclidin-Vilant (TRELEGY ELLIPTA) 100-62.5-25 MCG/ACT AEPB Inhale 1 puff into the lungs daily. 03/05/22   [provider]  metoprolol tartrate (LOPRESSOR) 25 MG tablet Take 1 tablet (25 mg total) by mouth 2 (two) times daily. Patient taking differently: Take 12.5 mg by mouth 2 (two) times  daily. 02/03/22   Ezekiel Slocumb, DO  Nutritional Supplements (FEEDING SUPPLEMENT, NEPRO CARB STEADY,) LIQD Take 237 mLs by mouth 3 (three) times daily between meals. Patient not taking: Reported on 04/12/2022 02/03/22   Nicole Kindred A, DO  OXYGEN Inhale 3 L into the lungs as needed (shortness of breath).    [provider]  pantoprazole (PROTONIX) 40 MG tablet Take 1 tablet (40 mg total) by mouth 2 (two) times daily before a meal. 08/05/20   Pokhrel, [place], MD  sodium bicarbonate 650 MG tablet Take 1,300 mg by mouth daily.    [provider]  torsemide 40 MG TABS Take 40 mg by mouth daily. 02/04/22   Ezekiel Slocumb, DO  zolpidem (AMBIEN) 5 MG tablet Take 5 mg by mouth at bedtime as needed for sleep. 06/23/21   [provider]     Allergies  Patient has no allergy information on record.   Family History   Family History  Problem Relation Age of Onset  . Breast cancer Mother 40  . Cancer Mother        Breast   . Cirrhosis Father   . Breast cancer Paternal Aunt 47  . Cancer Maternal Aunt        Breast      Physical Exam  Triage Vital Signs: ED Triage Vitals  Enc Vitals Group     BP 04/30/22 0033 101/73     Pulse Rate 04/30/22 0033 (!) 125     Resp 04/30/22 0033 20     Temp 04/30/22 0033 98.2 F (36.8 C)     Temp Source 04/30/22 0033 Oral     SpO2 04/30/22 0033 95 %     Weight 04/30/22 0027 113 lb (51.3 kg)     Height 04/30/22 0027 $RemoveBefor'5\' 5"'zYFwoqrmkfXI$  (1.651 m)     Head Circumference --      Peak Flow --      Pain Score 04/30/22 0027 0     Pain Loc --      Pain Edu? --      Excl. in [place]? --     Updated Vital Signs: BP (!) 118/106 (BP Location: Left Arm)   Pulse 83   Temp 98.2 F (36.8 C) (Oral)   Resp (!) 23   Ht $R'5\' 5"'dB$  (1.651 m)   Wt 51.3 kg   SpO2 98%   BMI 18.80 kg/m    General: Awake, no distress.  CV:  Mildly tachycardic irregular rhythm.  Good peripheral perfusion.  Resp:  Normal effort.  Baseline oxygen status. Abd:  Nontender.  No  distention.  Other:  No thyromegaly.  Calves are nontender and nonswollen.   ED Results / Procedures / Treatments  Labs (all labs ordered are listed, but only abnormal results are displayed) Labs Reviewed  BASIC METABOLIC PANEL - Abnormal; Notable for the following components:      Result Value   Sodium 133 (*)    Chloride 96 (*)    Glucose, Bld 175 (*)    BUN 37 (*)    Creatinine, Ser 2.14 (*)    Calcium 8.4 (*)    GFR, Estimated 25 (*)    All other components within normal limits  CBC - Abnormal; Notable for the following components:   RBC 2.73 (*)    Hemoglobin 8.6 (*)    HCT 26.7 (*)    All other components within normal limits  T4, FREE - Abnormal; Notable for the following components:   Free T4 1.15 (*)    All other components within normal limits  TSH  TROPONIN I (HIGH SENSITIVITY)  TROPONIN I (HIGH SENSITIVITY)     EKG  ED ECG REPORT I, Kinnick Maus J, the attending physician, personally viewed and interpreted this ECG.   Date: 04/30/2022  EKG Time: 0027  Rate: 95  Rhythm: atrial fibrillation, rate 95  Axis: Normal  Intervals:none  ST&T Change: Nonspecific    RADIOLOGY I have independently visualized and interpreted patient's chest x-ray as well as noted the radiology interpretation:  Chest x-ray: Vascular congestion improved  Official radiology report(s): DG Chest 2 View  Result Date: 04/30/2022 CLINICAL DATA:  Chest pressure. EXAM: CHEST - 2 VIEW COMPARISON:  Chest radiograph dated 04/13/2022. FINDINGS: Slight improvement of vascular congestion since the prior radiograph. Similar appearance of right pleural effusion and right lung base atelectasis. Pneumonia is not excluded. The left lung is clear. No pneumothorax. Mild cardiomegaly. Atherosclerotic calcification of the aorta. No acute osseous pathology. IMPRESSION: 1. Slight improvement of vascular congestion. 2. Similar appearance of right pleural effusion and right lung base atelectasis. Electronically  Signed   By: Anner Crete M.D.   On: 04/30/2022 01:20     PROCEDURES:  Critical Care performed: No  .1-3 Lead EKG Interpretation  Performed by: Paulette Blanch, MD Authorized by: Paulette Blanch, MD     Interpretation: normal     ECG rate:  95   ECG rate assessment: normal     Rhythm: atrial fibrillation     Ectopy: none     Conduction: normal   Comments:     Patient placed on cardiac monitor to evaluate for arrhythmias    MEDICATIONS ORDERED IN ED: Medications  sodium chloride 0.9 % bolus 250 mL (250 mLs Intravenous New Bag/Given 04/30/22 0514)  metoprolol tartrate (LOPRESSOR) injection 2.5 mg (2.5 mg Intravenous Given 04/30/22 0515)     IMPRESSION / MDM / ASSESSMENT AND PLAN / ED COURSE  I reviewed the triage vital signs and the nursing notes.                             66 year old female with atrial fibrillation presents to the ED with palpitations and chest tightness. Differential diagnosis includes, but is not limited to, ACS, aortic dissection, pulmonary embolism, cardiac tamponade, pneumothorax, pneumonia, pericarditis, myocarditis, GI-related causes including esophagitis/gastritis, and musculoskeletal chest wall pain.   I have personally reviewed patient's records and note her nephrology office visit on 04/19/2022  Patient's presentation is most consistent with exacerbation of chronic illness.  The patient is on the cardiac monitor to evaluate for evidence of arrhythmia and/or significant heart rate changes.  Laboratory results demonstrate slight  anemia H/H 8.6/26.7, mild AKI creatinine 2.14, initial troponin negative.  Chest x-ray demonstrates improved vascular congestion.  Will check thyroid panel and repeat troponin.  Patient's heart rate between 88-107; will administer low-dose IV Lopressor and gentle fluid hydration.  Clinical Course as of 04/30/22 0603  Mon Apr 30, 2022  0602 Heart rate 80-100.  Patient feeling better.  Updated her on negative repeat troponin and  unremarkable thyroid panel.  Instructed her to take her morning medications as usual and to call her cardiologist office for follow-up appointment.  Strict return precautions given.  Patient verbalizes understanding agrees with plan of care. [JS]    Clinical Course User Index [JS] Paulette Blanch, MD     FINAL CLINICAL IMPRESSION(S) / ED DIAGNOSES   Final diagnoses:  Palpitations  Atrial fibrillation, unspecified type ([place])  AKI (acute kidney injury) ([place])     Rx / DC Orders   ED Discharge Orders     None        Note:  This document was prepared using Dragon voice recognition software and may include unintentional dictation errors.   Paulette Blanch, MD 04/30/22 0700

## 2022-05-07 NOTE — Progress Notes (Deleted)
Patient ID: Danielle [name], female    DOB: 11-06-1955, 66 y.o.   MRN: [id number]  HPI  Ms Wanek is a 66 y/o female with a history of breast/ lung/ esophageal cancer, DM, HTN, COPD, anxiety, depression, lymphedema, previous tobacco use and chronic heart failure.   Echo report from 01/30/22 reviewed and showed an EF of 35-40% along with moderate MR, moderate/severe TR and moderate AS. Echo report from 11/07/19 reviewed and showed an EF of 50-55% along with mild TR and moderately elevated PA pressure. Echo report from 04/21/18 reviewed and showed an EF of 35-40% along with mild AS/ trivial AR and moderate MR.   Was in the ED 04/30/22 due to palpitations with AF RVR. IV lopressor given. Was in the ED 04/13/22 due to SOB. Abd/pelvic CT done. Given IV lasix and then released. Admitted 03/09/22 due to GIB. Given IV protonix drip. GI consult obtained. Upper.lower endoscopy done which showed diverticulosis and hemorrhoids. No active bleeding. Eliquis held but then restarted. Discharged after 2 days. Admitted 02/21/22 due to shortness of breath and found to be in AF RVR. Cardiology consult obtained. Successful cardioversion done. Chest CT showed stable right-sided pleural effusion. Underwent thoracentesis, 650 cc was removed. Discharged after 3 days.   She presents today for a follow-up visit with a chief complaint of   Past Medical History:  Diagnosis Date   Anemia    Anxiety    Aortic atherosclerosis ([place])    Aortic valve stenosis 02/10/2018   a.) TTE 02/10/2018: EF 55-60%: mild AS with MPG of 12 mmHg. b.) TTE 04/21/2018: EF 35-40%; mild AS with MPG 8 mmHg. c.) TTE 11/07/2019: EF 50-55%; mild AS with MPG 10 mmHg.   Arthritis    Atrial fibrillation (HCC)    a.) CHA2DS2-VASc = 4 (age, sex, HTN, aortic plaque). b.) Rate/rhythm maintained on oral carvedilol; chronically anticoagulated on dose reduced apixaban. c.)  Attempted deployment of LAA occlusive device on 01/11/2021; parameters failed and procedure  aborted.   Breast cancer, left ([place]) 2000   a.) T2N1M0; ER/PR (+) --> Tx'd with total mastectomy, LN resection, XRT, and chemotherapy   Cancer of right lung ([place]) 07/30/2016   a.) adenocarcinoma; ALK, ROS1, PDL1, BRAF, EGFR all negative.   Chronic diastolic CHF (congestive heart failure) ([place]) 11/26/2021   CKD (chronic kidney disease), stage IV (HCC)    COPD (chronic obstructive pulmonary disease) (HCC)    Dependence on supplemental oxygen    Depression    Diastolic dysfunction 81/85/6314   a.) TTE 02/10/2017: EF 55-60%; G2DD. b.) TTE 04/21/2018: EF 35-40%; mild LA dilation; mod MV regurgitation. c.) TTE 11/07/2019: EF 50-55%; G1DD.   DOE (dyspnea on exertion)    GERD (gastroesophageal reflux disease)    Heart murmur    History of 2019 novel coronavirus disease (COVID-19) 10/14/2019   HLD (hyperlipidemia)    Hypertension    Long term current use of anticoagulant    a.) apixaban   Lymphedema    Personal history of chemotherapy    Personal history of radiation therapy    SBO (small bowel obstruction) ([place]) 11/05/2020   Vitamin D deficiency    Past Surgical History:  Procedure Laterality Date   Breast Biospy Left    [facility]   BREAST SURGERY     CARDIOVERSION N/A 02/23/2022   Procedure: CARDIOVERSION;  Surgeon: Corey Skains, MD;  Location: [facility] ORS;  Service: Cardiovascular;  Laterality: N/A;   COLONOSCOPY N/A 04/30/2018   Procedure: COLONOSCOPY;  Surgeon: Virgel Manifold, MD;  Location: [facility] ENDOSCOPY;  Service: Endoscopy;  Laterality: N/A;   COLONOSCOPY N/A 07/22/2018   Procedure: COLONOSCOPY;  Surgeon: Virgel Manifold, MD;  Location: [facility] ENDOSCOPY;  Service: Endoscopy;  Laterality: N/A;   COLONOSCOPY WITH PROPOFOL N/A 09/21/2021   Procedure: COLONOSCOPY WITH PROPOFOL;  Surgeon: Benjamine Sprague, DO;  Location: [facility] ENDOSCOPY;  Service: General;  Laterality: N/A;   COLONOSCOPY WITH PROPOFOL N/A 03/11/2022   Procedure: COLONOSCOPY WITH PROPOFOL;  Surgeon: Annamaria Helling, DO;  Location: [facility] ENDOSCOPY;  Service: Gastroenterology;  Laterality: N/A;   DILATION AND CURETTAGE OF UTERUS     ELECTROMAGNETIC NAVIGATION BROCHOSCOPY Right 04/11/2016   Procedure: ELECTROMAGNETIC NAVIGATION BRONCHOSCOPY;  Surgeon: Vilinda Boehringer, MD;  Location: [facility] ORS;  Service: Cardiopulmonary;  Laterality: Right;   ESOPHAGOGASTRODUODENOSCOPY N/A 07/22/2018   Procedure: ESOPHAGOGASTRODUODENOSCOPY (EGD);  Surgeon: Virgel Manifold, MD;  Location: [facility] ENDOSCOPY;  Service: Endoscopy;  Laterality: N/A;   ESOPHAGOGASTRODUODENOSCOPY (EGD) WITH PROPOFOL N/A 05/07/2018   Procedure: ESOPHAGOGASTRODUODENOSCOPY (EGD) WITH PROPOFOL;  Surgeon: Lucilla Lame, MD;  Location: [facility] ENDOSCOPY;  Service: Endoscopy;  Laterality: N/A;   ESOPHAGOGASTRODUODENOSCOPY (EGD) WITH PROPOFOL N/A 04/24/2019   Procedure: ESOPHAGOGASTRODUODENOSCOPY (EGD) WITH PROPOFOL;  Surgeon: Jonathon Bellows, MD;  Location: [facility] ENDOSCOPY;  Service: Gastroenterology;  Laterality: N/A;   ESOPHAGOGASTRODUODENOSCOPY (EGD) WITH PROPOFOL N/A 01/12/2020   Procedure: ESOPHAGOGASTRODUODENOSCOPY (EGD) WITH PROPOFOL;  Surgeon: Jonathon Bellows, MD;  Location: [facility] ENDOSCOPY;  Service: Gastroenterology;  Laterality: N/A;   ESOPHAGOGASTRODUODENOSCOPY (EGD) WITH PROPOFOL N/A 04/28/2020   Procedure: ESOPHAGOGASTRODUODENOSCOPY (EGD) WITH PROPOFOL;  Surgeon: Jonathon Bellows, MD;  Location: [facility] ENDOSCOPY;  Service: Gastroenterology;  Laterality: N/A;   ESOPHAGOGASTRODUODENOSCOPY (EGD) WITH PROPOFOL N/A 03/11/2022   Procedure: ESOPHAGOGASTRODUODENOSCOPY (EGD) WITH PROPOFOL;  Surgeon: Annamaria Helling, DO;  Location: [facility] END[facility]COPY;  Service: Gastroenterology;  Laterality: N/A;   EUS N/A 05/07/2019   Procedure: FULL UPPER ENDOSCOPIC ULTRASOUND (EUS) RADIAL;  Surgeon: Jola Schmidt, MD;  Location: [facility] ENDOSCOPY;  Service: Endoscopy;  Laterality: N/A;   ILEOSCOPY N/A 07/22/2018   Procedure: ILEOSCOPY THROUGH STOMA;  Surgeon: Virgel Manifold, MD;   Location: [facility] ENDOSCOPY;  Service: Endoscopy;  Laterality: N/A;   ILEOSTOMY     ILEOSTOMY N/A 09/08/2018   Procedure: ILEOSTOMY REVISION POSSIBLE CREATION;  Surgeon: Herbert Pun, MD;  Location: [facility] ORS;  Service: General;  Laterality: N/A;   ILEOSTOMY CLOSURE N/A 08/15/2018   Procedure: DILATION OF ILEOSTOMY STRICTURE;  Surgeon: Herbert Pun, MD;  Location: [facility] ORS;  Service: General;  Laterality: N/A;   LAPAROTOMY Right 05/04/2018   Procedure: EXPLORATORY LAPAROTOMY right colectomy right and left ostomy;  Surgeon: Herbert Pun, MD;  Location: [facility] ORS;  Service: General;  Laterality: Right;   LEFT ATRIAL APPENDAGE OCCLUSION N/A 01/11/2021   Procedure: LEFT ATRIAL APPENDAGE OCCLUSION ([place]); ABORTED PROCEDURE WITHOUT DEVICE BEING IMPLANTED; Location: [facility]; Surgeon: Mylinda Latina, MD   LUNG BIOPSY     MASTECTOMY Left    2000, [place] Right    [place]   XI ROBOTIC ASSISTED COLOSTOMY TAKEDOWN N/A 10/23/2021   Procedure: XI ROBOTIC ASSISTED ILEOSTOMY TAKEDOWN;  Surgeon: Herbert Pun, MD;  Location: [facility] ORS;  Service: General;  Laterality: N/A;  180 minutes for the surgery part please   Family History  Problem Relation Age of Onset   Breast cancer Mother 65   Cancer Mother        Breast    Cirrhosis Father    Breast cancer Paternal Aunt 95   Cancer Maternal Aunt  Breast    Social History   Tobacco Use   Smoking status: Former    Packs/day: 0.50    Years: 20.00    Total pack years: 10.00    Types: Cigarettes    Quit date: 07/02/2012    Years since quitting: 9.8   Smokeless tobacco: Current    Types: Snuff  Substance Use Topics   Alcohol use: Yes    Alcohol/week: 3.0 standard drinks of alcohol    Types: 3 Cans of beer per week    Comment: Occasionally beer   Not on File    Review of Systems  Constitutional:  Positive for appetite change (decreased) and fatigue (worsening).  HENT:  Negative for  congestion, rhinorrhea and sore throat.   Eyes: Negative.   Respiratory:  Positive for cough (dry), chest tightness, shortness of breath (with moderate exertion) and wheezing.   Cardiovascular:  Negative for chest pain, palpitations and leg swelling.  Gastrointestinal:  Positive for abdominal distention (upper abdomen). Negative for abdominal pain.  Endocrine: Negative.   Genitourinary: Negative.   Musculoskeletal:  Negative for back pain and neck pain.  Skin: Negative.   Allergic/Immunologic: Negative.   Neurological:  Positive for headaches. Negative for dizziness and light-headedness.  Hematological:  Negative for adenopathy. Does not bruise/bleed easily.  Psychiatric/Behavioral:  Negative for dysphoric mood and sleep disturbance (sleeping on 1 pillow with oxygen @ 3L). The patient is not nervous/anxious.      Physical Exam Vitals and nursing note reviewed.  Constitutional:      Appearance: She is well-developed.  HENT:     Head: Normocephalic and atraumatic.  Neck:     Vascular: No JVD.  Cardiovascular:     Rate and Rhythm: Normal rate and regular rhythm.  Pulmonary:     Effort: Pulmonary effort is normal. No respiratory distress.     Breath sounds: No wheezing or rales.  Abdominal:     General: There is distension.     Palpations: Abdomen is soft.  Musculoskeletal:        General: No tenderness.     Cervical back: Normal range of motion and neck supple.     Right lower leg: No tenderness. Edema (trace pitting) present.     Left lower leg: No tenderness. Edema (trace pitting) present.  Skin:    General: Skin is warm and dry.  Neurological:     Mental Status: She is alert and oriented to person, place, and time.  Psychiatric:        Behavior: Behavior normal.    Assessment & Plan:  1: Chronic heart failure with reduced ejection fraction- - NYHA class II - euvolemic today - weighing daily; reminded to call for an overnight weight gain of >2 pounds or a weekly  weight gain of >5 pounds - weight stable from last visit here 1 month ago - on GDMT of metoprolol (although tartrate) - consider SGLT2 if renal function continues to look good - drinking 36 ounces of water and 10 ounces of coffee daily; drinks 36 ounces of beer over the weekends - not adding salt but does eat out often; reviewed how to make low sodium choices when eating out - BNP 02/21/22 was 1059.9  2: HTN- - BP  - sees PCP (@ [facility])  - BMP reviewed 04/30/22 and showed sodium 133, potassium 3.6, creatinine 2.14 and GFR 25 - saw nephrology Holley Raring) 04/19/22  3: Atrial fibrillation- - saw cardiology Petra Kuba) 04/13/22 - previous successful cardioversion  4: COPD- -  saw pulmonology Raul Del) 05/02/22 - using trelegy and albuterol - wearing oxygen at 3L around the clock   Medication bottles reviewed.

## 2022-05-08 ENCOUNTER — Emergency Department: Payer: Medicare Other

## 2022-05-08 ENCOUNTER — Inpatient Hospital Stay: Payer: Medicare Other

## 2022-05-08 ENCOUNTER — Ambulatory Visit: Payer: Medicare Other | Admitting: Family

## 2022-05-08 ENCOUNTER — Other Ambulatory Visit: Payer: Self-pay

## 2022-05-08 ENCOUNTER — Inpatient Hospital Stay
Admission: EM | Admit: 2022-05-08 | Discharge: 2022-05-12 | DRG: 292 | Disposition: A | Discharge status: Home / Self Care | Payer: Medicare Other | Attending: Internal Medicine | Admitting: Internal Medicine

## 2022-05-08 DIAGNOSIS — M1990 Unspecified osteoarthritis, unspecified site: Secondary | ICD-10-CM | POA: Diagnosis present

## 2022-05-08 DIAGNOSIS — I350 Nonrheumatic aortic (valve) stenosis: Secondary | ICD-10-CM | POA: Diagnosis present

## 2022-05-08 DIAGNOSIS — I130 Hypertensive heart and chronic kidney disease with heart failure and stage 1 through stage 4 chronic kidney disease, or unspecified chronic kidney disease: Secondary | ICD-10-CM | POA: Diagnosis present

## 2022-05-08 DIAGNOSIS — J439 Emphysema, unspecified: Secondary | ICD-10-CM | POA: Diagnosis present

## 2022-05-08 DIAGNOSIS — K219 Gastro-esophageal reflux disease without esophagitis: Secondary | ICD-10-CM | POA: Diagnosis present

## 2022-05-08 DIAGNOSIS — T45515A Adverse effect of anticoagulants, initial encounter: Secondary | ICD-10-CM | POA: Diagnosis not present

## 2022-05-08 DIAGNOSIS — N184 Chronic kidney disease, stage 4 (severe): Secondary | ICD-10-CM | POA: Diagnosis present

## 2022-05-08 DIAGNOSIS — C155 Malignant neoplasm of lower third of esophagus: Secondary | ICD-10-CM | POA: Diagnosis present

## 2022-05-08 DIAGNOSIS — J9611 Chronic respiratory failure with hypoxia: Secondary | ICD-10-CM | POA: Diagnosis present

## 2022-05-08 DIAGNOSIS — Z9012 Acquired absence of left breast and nipple: Secondary | ICD-10-CM

## 2022-05-08 DIAGNOSIS — D631 Anemia in chronic kidney disease: Secondary | ICD-10-CM | POA: Diagnosis present

## 2022-05-08 DIAGNOSIS — I5022 Chronic systolic (congestive) heart failure: Secondary | ICD-10-CM | POA: Diagnosis present

## 2022-05-08 DIAGNOSIS — Z9981 Dependence on supplemental oxygen: Secondary | ICD-10-CM

## 2022-05-08 DIAGNOSIS — Z9221 Personal history of antineoplastic chemotherapy: Secondary | ICD-10-CM

## 2022-05-08 DIAGNOSIS — I480 Paroxysmal atrial fibrillation: Secondary | ICD-10-CM | POA: Diagnosis present

## 2022-05-08 DIAGNOSIS — J449 Chronic obstructive pulmonary disease, unspecified: Secondary | ICD-10-CM

## 2022-05-08 DIAGNOSIS — Z8616 Personal history of COVID-19: Secondary | ICD-10-CM | POA: Diagnosis not present

## 2022-05-08 DIAGNOSIS — E785 Hyperlipidemia, unspecified: Secondary | ICD-10-CM | POA: Diagnosis present

## 2022-05-08 DIAGNOSIS — I700 Atherosclerosis of aorta: Secondary | ICD-10-CM | POA: Diagnosis present

## 2022-05-08 DIAGNOSIS — E559 Vitamin D deficiency, unspecified: Secondary | ICD-10-CM | POA: Diagnosis present

## 2022-05-08 DIAGNOSIS — Z7951 Long term (current) use of inhaled steroids: Secondary | ICD-10-CM

## 2022-05-08 DIAGNOSIS — Z85118 Personal history of other malignant neoplasm of bronchus and lung: Secondary | ICD-10-CM

## 2022-05-08 DIAGNOSIS — I5023 Acute on chronic systolic (congestive) heart failure: Secondary | ICD-10-CM | POA: Diagnosis present

## 2022-05-08 DIAGNOSIS — Z79899 Other long term (current) drug therapy: Secondary | ICD-10-CM

## 2022-05-08 DIAGNOSIS — D6832 Hemorrhagic disorder due to extrinsic circulating anticoagulants: Secondary | ICD-10-CM | POA: Diagnosis not present

## 2022-05-08 DIAGNOSIS — Z7901 Long term (current) use of anticoagulants: Secondary | ICD-10-CM

## 2022-05-08 DIAGNOSIS — Z923 Personal history of irradiation: Secondary | ICD-10-CM

## 2022-05-08 DIAGNOSIS — R040 Epistaxis: Secondary | ICD-10-CM | POA: Diagnosis present

## 2022-05-08 DIAGNOSIS — C3411 Malignant neoplasm of upper lobe, right bronchus or lung: Secondary | ICD-10-CM | POA: Diagnosis present

## 2022-05-08 DIAGNOSIS — Z9889 Other specified postprocedural states: Secondary | ICD-10-CM

## 2022-05-08 DIAGNOSIS — I890 Lymphedema, not elsewhere classified: Secondary | ICD-10-CM | POA: Diagnosis present

## 2022-05-08 DIAGNOSIS — J90 Pleural effusion, not elsewhere classified: Secondary | ICD-10-CM | POA: Diagnosis not present

## 2022-05-08 DIAGNOSIS — F419 Anxiety disorder, unspecified: Secondary | ICD-10-CM | POA: Diagnosis present

## 2022-05-08 DIAGNOSIS — F32A Depression, unspecified: Secondary | ICD-10-CM | POA: Diagnosis present

## 2022-05-08 DIAGNOSIS — Z20822 Contact with and (suspected) exposure to covid-19: Secondary | ICD-10-CM | POA: Diagnosis present

## 2022-05-08 DIAGNOSIS — Z720 Tobacco use: Secondary | ICD-10-CM | POA: Diagnosis not present

## 2022-05-08 DIAGNOSIS — Z803 Family history of malignant neoplasm of breast: Secondary | ICD-10-CM

## 2022-05-08 LAB — COMPREHENSIVE METABOLIC PANEL
ALT: 18 U/L (ref 0–44)
AST: 36 U/L (ref 15–41)
Albumin: 3.7 g/dL (ref 3.5–5.0)
Alkaline Phosphatase: 63 U/L (ref 38–126)
Anion gap: 13 (ref 5–15)
BUN: 24 mg/dL — ABNORMAL HIGH (ref 8–23)
CO2: 26 mmol/L (ref 22–32)
Calcium: 8.7 mg/dL — ABNORMAL LOW (ref 8.9–10.3)
Chloride: 98 mmol/L (ref 98–111)
Creatinine, Ser: 1.53 mg/dL — ABNORMAL HIGH (ref 0.44–1.00)
GFR, Estimated: 37 mL/min — ABNORMAL LOW (ref >60)
Glucose, Bld: 90 mg/dL (ref 70–99)
Potassium: 3.6 mmol/L (ref 3.5–5.1)
Sodium: 137 mmol/L (ref 135–145)
Total Bilirubin: 1.1 mg/dL (ref 0.3–1.2)
Total Protein: 7.3 g/dL (ref 6.5–8.1)

## 2022-05-08 LAB — URINALYSIS, ROUTINE W REFLEX MICROSCOPIC
Bacteria, UA: NONE SEEN
Bilirubin Urine: NEGATIVE
Glucose, UA: NEGATIVE mg/dL
Hgb urine dipstick: NEGATIVE
Ketones, ur: 5 mg/dL — AB
Leukocytes,Ua: NEGATIVE
Nitrite: NEGATIVE
Protein, ur: >=300 mg/dL — AB
Specific Gravity, Urine: 1.011 (ref 1.005–1.030)
pH: 5 (ref 5.0–8.0)

## 2022-05-08 LAB — ALBUMIN, PLEURAL OR PERITONEAL FLUID: Albumin, Fluid: 1.9 g/dL

## 2022-05-08 LAB — CBC
HCT: 28.4 % — ABNORMAL LOW (ref 36.0–46.0)
Hemoglobin: 9.3 g/dL — ABNORMAL LOW (ref 12.0–15.0)
MCH: 31.7 pg (ref 26.0–34.0)
MCHC: 32.7 g/dL (ref 30.0–36.0)
MCV: 96.9 fL (ref 80.0–100.0)
Platelets: 226 10*3/uL (ref 150–400)
RBC: 2.93 MIL/uL — ABNORMAL LOW (ref 3.87–5.11)
RDW: 14.8 % (ref 11.5–15.5)
WBC: 6.5 10*3/uL (ref 4.0–10.5)
nRBC: 0 % (ref 0.0–0.2)

## 2022-05-08 LAB — BODY FLUID CELL COUNT WITH DIFFERENTIAL
Eos, Fluid: 1 %
Lymphs, Fluid: 68 %
Monocyte-Macrophage-Serous Fluid: 18 %
Neutrophil Count, Fluid: 13 %
Total Nucleated Cell Count, Fluid: 400 cu mm

## 2022-05-08 LAB — PROTEIN, PLEURAL OR PERITONEAL FLUID: Total protein, fluid: <3 g/dL

## 2022-05-08 LAB — GLUCOSE, PLEURAL OR PERITONEAL FLUID: Glucose, Fluid: 87 mg/dL

## 2022-05-08 LAB — PROTIME-INR
INR: 1.2 (ref 0.8–1.2)
Prothrombin Time: 15.4 seconds — ABNORMAL HIGH (ref 11.4–15.2)

## 2022-05-08 LAB — TROPONIN I (HIGH SENSITIVITY)
Troponin I (High Sensitivity): 21 ng/L — ABNORMAL HIGH (ref <18)
Troponin I (High Sensitivity): 23 ng/L — ABNORMAL HIGH (ref <18)

## 2022-05-08 LAB — LACTATE DEHYDROGENASE, PLEURAL OR PERITONEAL FLUID: LD, Fluid: 53 U/L — ABNORMAL HIGH (ref 3–23)

## 2022-05-08 LAB — BRAIN NATRIURETIC PEPTIDE: B Natriuretic Peptide: 1281.4 pg/mL — ABNORMAL HIGH (ref 0.0–100.0)

## 2022-05-08 LAB — SARS CORONAVIRUS 2 BY RT PCR: SARS Coronavirus 2 by RT PCR: NEGATIVE

## 2022-05-08 LAB — CBG MONITORING, ED: Glucose-Capillary: 87 mg/dL (ref 70–99)

## 2022-05-08 LAB — APTT: aPTT: 35 seconds (ref 24–36)

## 2022-05-08 MED ORDER — SODIUM CHLORIDE 0.9% FLUSH
3.0000 mL | Freq: Two times a day (BID) | INTRAVENOUS | Status: DC
Start: 2022-05-08 — End: 2022-05-12
  Administered 2022-05-08 – 2022-05-12 (×8): 3 mL via INTRAVENOUS

## 2022-05-08 MED ORDER — ZOLPIDEM TARTRATE 5 MG PO TABS
5.0000 mg | ORAL_TABLET | Freq: Every evening | ORAL | Status: DC | PRN
  Administered 2022-05-08 – 2022-05-11 (×4): 5 mg via ORAL
  Filled 2022-05-08 (×4): qty 1

## 2022-05-08 MED ORDER — CITALOPRAM HYDROBROMIDE 20 MG PO TABS
20.0000 mg | ORAL_TABLET | Freq: Every day | ORAL | Status: DC
  Administered 2022-05-08 – 2022-05-12 (×5): 20 mg via ORAL
  Filled 2022-05-08 (×5): qty 1

## 2022-05-08 MED ORDER — ACETAMINOPHEN 325 MG PO TABS
650.0000 mg | ORAL_TABLET | ORAL | Status: DC | PRN
  Administered 2022-05-08 – 2022-05-12 (×10): 650 mg via ORAL
  Filled 2022-05-08 (×10): qty 2

## 2022-05-08 MED ORDER — IPRATROPIUM-ALBUTEROL 0.5-2.5 (3) MG/3ML IN SOLN
3.0000 mL | Freq: Once | RESPIRATORY_TRACT | Status: AC
  Administered 2022-05-08: 3 mL via RESPIRATORY_TRACT
  Filled 2022-05-08: qty 3

## 2022-05-08 MED ORDER — TORSEMIDE 40 MG PO TABS
40.0000 mg | ORAL_TABLET | Freq: Every day | ORAL | Status: DC
Start: 2022-05-08 — End: 2022-05-08

## 2022-05-08 MED ORDER — FERROUS SULFATE 325 (65 FE) MG PO TABS
325.0000 mg | ORAL_TABLET | Freq: Every day | ORAL | Status: DC
  Administered 2022-05-09 – 2022-05-12 (×4): 325 mg via ORAL
  Filled 2022-05-08 (×4): qty 1

## 2022-05-08 MED ORDER — APIXABAN 2.5 MG PO TABS
2.5000 mg | ORAL_TABLET | Freq: Two times a day (BID) | ORAL | Status: DC
Start: 2022-05-08 — End: 2022-05-11
  Administered 2022-05-08 – 2022-05-09 (×4): 2.5 mg via ORAL
  Filled 2022-05-08 (×6): qty 1

## 2022-05-08 MED ORDER — ONDANSETRON HCL 4 MG PO TABS: 4.0000 mg | ORAL_TABLET | Freq: Four times a day (QID) | ORAL | Status: DC | PRN

## 2022-05-08 MED ORDER — ONDANSETRON HCL 4 MG/2ML IJ SOLN: 4.0000 mg | Freq: Four times a day (QID) | INTRAMUSCULAR | Status: DC | PRN

## 2022-05-08 MED ORDER — AMIODARONE HCL 200 MG PO TABS
200.0000 mg | ORAL_TABLET | Freq: Two times a day (BID) | ORAL | Status: DC
  Administered 2022-05-08 – 2022-05-12 (×8): 200 mg via ORAL
  Filled 2022-05-08 (×8): qty 1

## 2022-05-08 MED ORDER — IPRATROPIUM-ALBUTEROL 0.5-2.5 (3) MG/3ML IN SOLN: 3.0000 mL | Freq: Four times a day (QID) | RESPIRATORY_TRACT | Status: DC | PRN

## 2022-05-08 MED ORDER — SODIUM CHLORIDE 0.9 % IV SOLN: 250.0000 mL | INTRAVENOUS | Status: DC | PRN

## 2022-05-08 MED ORDER — SODIUM CHLORIDE 0.9 % IV SOLN
INTRAVENOUS | Status: DC
Start: 2022-05-08 — End: 2022-05-08

## 2022-05-08 MED ORDER — METOPROLOL TARTRATE 25 MG PO TABS
12.5000 mg | ORAL_TABLET | Freq: Two times a day (BID) | ORAL | Status: DC
  Administered 2022-05-09 – 2022-05-12 (×7): 12.5 mg via ORAL
  Filled 2022-05-08 (×7): qty 1

## 2022-05-08 MED ORDER — SODIUM CHLORIDE 0.9% FLUSH: 3.0000 mL | INTRAVENOUS | Status: DC | PRN

## 2022-05-08 MED ORDER — UMECLIDINIUM BROMIDE 62.5 MCG/ACT IN AEPB
1.0000 | INHALATION_SPRAY | Freq: Every day | RESPIRATORY_TRACT | Status: DC
  Administered 2022-05-09 – 2022-05-12 (×3): 1 via RESPIRATORY_TRACT
  Filled 2022-05-08: qty 7

## 2022-05-08 MED ORDER — ATORVASTATIN CALCIUM 20 MG PO TABS
40.0000 mg | ORAL_TABLET | Freq: Every evening | ORAL | Status: DC
  Administered 2022-05-08 – 2022-05-11 (×4): 40 mg via ORAL
  Filled 2022-05-08 (×5): qty 2

## 2022-05-08 MED ORDER — TORSEMIDE 20 MG PO TABS
40.0000 mg | ORAL_TABLET | Freq: Every day | ORAL | Status: DC
  Administered 2022-05-09 – 2022-05-12 (×4): 40 mg via ORAL
  Filled 2022-05-08 (×4): qty 2

## 2022-05-08 MED ORDER — METHYLPREDNISOLONE SODIUM SUCC 125 MG IJ SOLR
125.0000 mg | Freq: Once | INTRAMUSCULAR | Status: AC
  Administered 2022-05-08: 125 mg via INTRAVENOUS
  Filled 2022-05-08: qty 2

## 2022-05-08 MED ORDER — SODIUM CHLORIDE 0.9 % IV SOLN: INTRAVENOUS | Status: DC

## 2022-05-08 MED ORDER — SODIUM BICARBONATE 650 MG PO TABS
1300.0000 mg | ORAL_TABLET | Freq: Every day | ORAL | Status: DC
  Administered 2022-05-08 – 2022-05-12 (×5): 1300 mg via ORAL
  Filled 2022-05-08 (×5): qty 2

## 2022-05-08 MED ORDER — FLUTICASONE FUROATE-VILANTEROL 100-25 MCG/ACT IN AEPB
1.0000 | INHALATION_SPRAY | Freq: Every day | RESPIRATORY_TRACT | Status: DC
  Administered 2022-05-09 – 2022-05-12 (×3): 1 via RESPIRATORY_TRACT
  Filled 2022-05-08: qty 28

## 2022-05-08 MED ORDER — PANTOPRAZOLE SODIUM 40 MG PO TBEC
40.0000 mg | DELAYED_RELEASE_TABLET | Freq: Two times a day (BID) | ORAL | Status: DC
Start: 2022-05-08 — End: 2022-05-09
  Administered 2022-05-08 – 2022-05-09 (×2): 40 mg via ORAL
  Filled 2022-05-08 (×2): qty 1

## 2022-05-08 MED ORDER — METOPROLOL TARTRATE 25 MG PO TABS
12.5000 mg | ORAL_TABLET | Freq: Two times a day (BID) | ORAL | Status: DC
Start: 2022-05-08 — End: 2022-05-08

## 2022-05-08 NOTE — Assessment & Plan Note (Signed)
Stable ?Continue Celexa ?

## 2022-05-08 NOTE — Assessment & Plan Note (Signed)
Continue amiodarone for rate control as well as low-dose beta-blockers Continue apixaban as primary prophylaxis for an acute stroke

## 2022-05-08 NOTE — ED Triage Notes (Signed)
Pt here via [facility] with epistaxis since yesterday. Pt also having dizziness, wears oxygen at baseline. Pt also not eating and drinking for the past few days.  135/81 89 18 98%-4L 97.7 10--cbg

## 2022-05-08 NOTE — Assessment & Plan Note (Signed)
Patient has a history of adenocarcinoma of the right lung and is status post radiation therapy Follow-up with oncology as an outpatient

## 2022-05-08 NOTE — Assessment & Plan Note (Signed)
Patient has a history of esophageal cancer and is status post radiation therapy Follow up with oncology as an outpatient

## 2022-05-08 NOTE — Procedures (Signed)
PROCEDURE SUMMARY:  Successful US guided right thoracentesis. Yielded 1.2 L of clear yellow fluid. Pt tolerated procedure well. No immediate complications.  Specimen was sent for labs. CXR ordered.  EBL < 1 mL  Rockney Ghee 05/08/2022 12:07 PM

## 2022-05-08 NOTE — H&P (Signed)
History and Physical    Patient: Danielle [name] FBP:[id number] DOB: 08/15/1956 DOA: 05/08/2022 DOS: the patient was seen and examined on 05/08/2022 PCP: [name], [place]  Patient coming from: Home  Chief Complaint:  Chief Complaint  Patient presents with   Epistaxis   HPI: Danielle [name] is a 66 y.o. female with medical history significant for A-fib on chronic anticoagulation therapy, history of chronic systolic heart failure, history of adenocarcinoma of the right lung status post radiation therapy, history of stage IIIb chronic kidney disease, aortic valve stenosis, COPD with chronic respiratory failure on 4 L of oxygen continuous, history of left breast cancer status post mastectomy, history of esophageal cancer who presents to the ER for evaluation of worsening shortness of breath from her baseline associated with a dry cough and dizziness mostly when she gets up from a supine position.  She denies having any ringing in her ears or any hearing loss. She also had episodes of nosebleeds that have since resolved. She complains of poor oral intake and has not been eating or drinking for several days.  She denies having abdominal pain, no nausea or vomiting.  She has no changes in her bowel habits. She denies having any fever, no chills, no chest pain, no leg swelling, no orthopnea or PND, no urinary frequency, no nocturia, no dysuria, no blurred vision no focal deficits. Imaging shows a moderate right pleural effusion   Review of Systems: As mentioned in the history of present illness. All other systems reviewed and are negative. Past Medical History:  Diagnosis Date   Anemia    Anxiety    Aortic atherosclerosis ([place])    Aortic valve stenosis 02/10/2018   a.) TTE 02/10/2018: EF 55-60%: mild AS with MPG of 12 mmHg. b.) TTE 04/21/2018: EF 35-40%; mild AS with MPG 8 mmHg. c.) TTE 11/07/2019: EF 50-55%; mild AS with MPG 10 mmHg.   Arthritis    Atrial fibrillation (HCC)    a.)  CHA2DS2-VASc = 4 (age, sex, HTN, aortic plaque). b.) Rate/rhythm maintained on oral carvedilol; chronically anticoagulated on dose reduced apixaban. c.)  Attempted deployment of LAA occlusive device on 01/11/2021; parameters failed and procedure aborted.   Breast cancer, left ([place]) 2000   a.) T2N1M0; ER/PR (+) --> Tx'd with total mastectomy, LN resection, XRT, and chemotherapy   Cancer of right lung ([place]) 07/30/2016   a.) adenocarcinoma; ALK, ROS1, PDL1, BRAF, EGFR all negative.   Chronic diastolic CHF (congestive heart failure) ([place]) 11/26/2021   CKD (chronic kidney disease), stage IV (HCC)    COPD (chronic obstructive pulmonary disease) (HCC)    Dependence on supplemental oxygen    Depression    Diastolic dysfunction 70/92/9574   a.) TTE 02/10/2017: EF 55-60%; G2DD. b.) TTE 04/21/2018: EF 35-40%; mild LA dilation; mod MV regurgitation. c.) TTE 11/07/2019: EF 50-55%; G1DD.   DOE (dyspnea on exertion)    GERD (gastroesophageal reflux disease)    Heart murmur    History of 2019 novel coronavirus disease (COVID-19) 10/14/2019   HLD (hyperlipidemia)    Hypertension    Long term current use of anticoagulant    a.) apixaban   Lymphedema    Personal history of chemotherapy    Personal history of radiation therapy    SBO (small bowel obstruction) ([place]) 11/05/2020   Vitamin D deficiency    Past Surgical History:  Procedure Laterality Date   Breast Biospy Left    [facility]   BREAST SURGERY     CARDIOVERSION N/A 02/23/2022  Procedure: CARDIOVERSION;  Surgeon: Corey Skains, MD;  Location: [facility] ORS;  Service: Cardiovascular;  Laterality: N/A;   COLONOSCOPY N/A 04/30/2018   Procedure: COLONOSCOPY;  Surgeon: Virgel Manifold, MD;  Location: [facility] ENDOSCOPY;  Service: Endoscopy;  Laterality: N/A;   COLONOSCOPY N/A 07/22/2018   Procedure: COLONOSCOPY;  Surgeon: Virgel Manifold, MD;  Location: [facility] ENDOSCOPY;  Service: Endoscopy;  Laterality: N/A;   COLONOSCOPY WITH PROPOFOL N/A 09/21/2021    Procedure: COLONOSCOPY WITH PROPOFOL;  Surgeon: Benjamine Sprague, DO;  Location: [facility] ENDOSCOPY;  Service: General;  Laterality: N/A;   COLONOSCOPY WITH PROPOFOL N/A 03/11/2022   Procedure: COLONOSCOPY WITH PROPOFOL;  Surgeon: Annamaria Helling, DO;  Location: [facility] ENDOSCOPY;  Service: Gastroenterology;  Laterality: N/A;   DILATION AND CURETTAGE OF UTERUS     ELECTROMAGNETIC NAVIGATION BROCHOSCOPY Right 04/11/2016   Procedure: ELECTROMAGNETIC NAVIGATION BRONCHOSCOPY;  Surgeon: Vilinda Boehringer, MD;  Location: [facility] ORS;  Service: Cardiopulmonary;  Laterality: Right;   ESOPHAGOGASTRODUODENOSCOPY N/A 07/22/2018   Procedure: ESOPHAGOGASTRODUODENOSCOPY (EGD);  Surgeon: Virgel Manifold, MD;  Location: [facility] ENDOSCOPY;  Service: Endoscopy;  Laterality: N/A;   ESOPHAGOGASTRODUODENOSCOPY (EGD) WITH PROPOFOL N/A 05/07/2018   Procedure: ESOPHAGOGASTRODUODENOSCOPY (EGD) WITH PROPOFOL;  Surgeon: Lucilla Lame, MD;  Location: [facility] ENDOSCOPY;  Service: Endoscopy;  Laterality: N/A;   ESOPHAGOGASTRODUODENOSCOPY (EGD) WITH PROPOFOL N/A 04/24/2019   Procedure: ESOPHAGOGASTRODUODENOSCOPY (EGD) WITH PROPOFOL;  Surgeon: Jonathon Bellows, MD;  Location: [facility] ENDOSCOPY;  Service: Gastroenterology;  Laterality: N/A;   ESOPHAGOGASTRODUODENOSCOPY (EGD) WITH PROPOFOL N/A 01/12/2020   Procedure: ESOPHAGOGASTRODUODENOSCOPY (EGD) WITH PROPOFOL;  Surgeon: Jonathon Bellows, MD;  Location: [facility] ENDOSCOPY;  Service: Gastroenterology;  Laterality: N/A;   ESOPHAGOGASTRODUODENOSCOPY (EGD) WITH PROPOFOL N/A 04/28/2020   Procedure: ESOPHAGOGASTRODUODENOSCOPY (EGD) WITH PROPOFOL;  Surgeon: Jonathon Bellows, MD;  Location: [facility] ENDOSCOPY;  Service: Gastroenterology;  Laterality: N/A;   ESOPHAGOGASTRODUODENOSCOPY (EGD) WITH PROPOFOL N/A 03/11/2022   Procedure: ESOPHAGOGASTRODUODENOSCOPY (EGD) WITH PROPOFOL;  Surgeon: Annamaria Helling, DO;  Location: [facility] ENDOSCOPY;  Service: Gastroenterology;  Laterality: N/A;   EUS N/A 05/07/2019   Procedure: FULL  UPPER ENDOSCOPIC ULTRASOUND (EUS) RADIAL;  Surgeon: Jola Schmidt, MD;  Location: [facility] ENDOSCOPY;  Service: Endoscopy;  Laterality: N/A;   ILEOSCOPY N/A 07/22/2018   Procedure: ILEOSCOPY THROUGH STOMA;  Surgeon: Virgel Manifold, MD;  Location: [facility] ENDOSCOPY;  Service: Endoscopy;  Laterality: N/A;   ILEOSTOMY     ILEOSTOMY N/A 09/08/2018   Procedure: ILEOSTOMY REVISION POSSIBLE CREATION;  Surgeon: Herbert Pun, MD;  Location: [facility] ORS;  Service: General;  Laterality: N/A;   ILEOSTOMY CLOSURE N/A 08/15/2018   Procedure: DILATION OF ILEOSTOMY STRICTURE;  Surgeon: Herbert Pun, MD;  Location: [facility] ORS;  Service: General;  Laterality: N/A;   LAPAROTOMY Right 05/04/2018   Procedure: EXPLORATORY LAPAROTOMY right colectomy right and left ostomy;  Surgeon: Herbert Pun, MD;  Location: [facility] ORS;  Service: General;  Laterality: Right;   LEFT ATRIAL APPENDAGE OCCLUSION N/A 01/11/2021   Procedure: LEFT ATRIAL APPENDAGE OCCLUSION ([place]); ABORTED PROCEDURE WITHOUT DEVICE BEING IMPLANTED; Location: [facility]; Surgeon: Mylinda Latina, MD   LUNG BIOPSY     MASTECTOMY Left    2000, [place]   ROTATOR CUFF REPAIR Right    [place] TAKEDOWN N/A 10/23/2021   Procedure: XI ROBOTIC ASSISTED ILEOSTOMY TAKEDOWN;  Surgeon: Herbert Pun, MD;  Location: [facility] ORS;  Service: General;  Laterality: N/A;  180 minutes for the surgery part please   Social History:  reports that she quit smoking about 9 years ago. Her smoking use included  cigarettes. She has a 10.00 pack-year smoking history. Her smokeless tobacco use includes snuff. She reports current alcohol use of about 3.0 standard drinks of alcohol per week. She reports that she does not use drugs.  No Known Allergies  Family History  Problem Relation Age of Onset   Breast cancer Mother 25   Cancer Mother        Breast    Cirrhosis Father    Breast cancer Paternal Aunt 43   Cancer Maternal Aunt         Breast     Prior to Admission medications   Medication Sig Start Date End Date Taking? Authorizing Provider  acetaminophen (TYLENOL) 325 MG tablet Take 2 tablets (650 mg total) by mouth every 6 (six) hours as needed for mild pain (or Fever >/= 101). Patient taking differently: Take 650 mg by mouth every 4 (four) hours as needed for moderate pain. 12/24/18   Gouru, Illene Silver, MD  albuterol (PROVENTIL HFA;VENTOLIN HFA) 108 (90 Base) MCG/ACT inhaler Inhale 2 puffs into the lungs every 6 (six) hours as needed for wheezing or shortness of breath. 04/24/18   Demetrios Loll, MD  amiodarone (PACERONE) 200 MG tablet Take 1 tablet (200 mg total) by mouth 2 (two) times daily. 11/24/21   Herbert Pun, MD  apixaban (ELIQUIS) 2.5 MG TABS tablet Take 2.5 mg by mouth 2 (two) times daily. 01/11/21   [provider]  atorvastatin (LIPITOR) 40 MG tablet Take 1 tablet (40 mg total) by mouth daily. 02/03/22   Ezekiel Slocumb, DO  calcium carbonate (TUMS - DOSED IN MG ELEMENTAL CALCIUM) 500 MG chewable tablet Chew 1 tablet by mouth 2 (two) times daily as needed.    [provider]  citalopram (CELEXA) 20 MG tablet Take 1 tablet (20 mg total) by mouth daily. 02/04/22   Nicole Kindred A, DO  ferrous sulfate 325 (65 FE) MG tablet Take 325 mg by mouth daily with breakfast.    [provider]  Fluticasone-Umeclidin-Vilant (TRELEGY ELLIPTA) 100-62.5-25 MCG/ACT AEPB Inhale 1 puff into the lungs daily. 03/05/22   [provider]  metoprolol tartrate (LOPRESSOR) 25 MG tablet Take 1 tablet (25 mg total) by mouth 2 (two) times daily. Patient taking differently: Take 12.5 mg by mouth 2 (two) times daily. 02/03/22   Ezekiel Slocumb, DO  Nutritional Supplements (FEEDING SUPPLEMENT, NEPRO CARB STEADY,) LIQD Take 237 mLs by mouth 3 (three) times daily between meals. Patient not taking: Reported on 04/12/2022 02/03/22   Nicole Kindred A, DO  OXYGEN Inhale 3 L into the lungs as needed (shortness of  breath).    [provider]  pantoprazole (PROTONIX) 40 MG tablet Take 1 tablet (40 mg total) by mouth 2 (two) times daily before a meal. 08/05/20   Pokhrel, [place], MD  sodium bicarbonate 650 MG tablet Take 1,300 mg by mouth daily.    [provider]  torsemide 40 MG TABS Take 40 mg by mouth daily. 02/04/22   Ezekiel Slocumb, DO  zolpidem (AMBIEN) 5 MG tablet Take 5 mg by mouth at bedtime as needed for sleep. 06/23/21   [provider]    Physical Exam: Vitals:   05/08/22 0925 05/08/22 1000 05/08/22 1030 05/08/22 1130  BP: 121/79 129/67 124/75 (!) 143/72  Pulse: 86 85 85 88  Resp: (!) 23 (!) 24 (!) 24 (!) 26  Temp:    99.2 F (37.3 C)  TempSrc:    Oral  SpO2: 97% 96% 93% 95%  Weight:  Height:       Physical Exam Vitals and nursing note reviewed.  Constitutional:      Comments: Chronically ill-appearing, frail  HENT:     Head: Normocephalic and atraumatic.     Nose: Nose normal.     Mouth/Throat:     Mouth: Mucous membranes are dry.  Eyes:     Comments: Pale conjunctiva  Cardiovascular:     Rate and Rhythm: Normal rate and regular rhythm.  Pulmonary:     Breath sounds: Rales present.     Comments: Tachypnea Abdominal:     General: Abdomen is flat. Bowel sounds are normal.     Palpations: Abdomen is soft.  Musculoskeletal:        General: Normal range of motion.     Cervical back: Normal range of motion and neck supple.  Skin:    General: Skin is warm and dry.  Neurological:     Mental Status: She is alert.     Motor: Weakness present.  Psychiatric:        Mood and Affect: Mood normal.        Behavior: Behavior normal.     Data Reviewed Relevant notes from primary care and specialist visits, past discharge summaries as available in EHR, including Care Everywhere. Prior diagnostic testing as pertinent to current admission diagnoses Updated medications and problem lists for reconciliation ED course, including vitals, labs, imaging,  treatment and response to treatment Triage notes, nursing and pharmacy notes and ED provider's notes Notable results as noted in HPI Labs reviewed.  BNP 1281, sodium 137, potassium 3.6, chloride 98, bicarb 26, glucose 90, BUN 24, creatinine 1.53, calcium 8.7, total protein 7.3, albumin 3.7, AST 36, ALT 18, alk phos 63, troponin 21, white count 6.5, hemoglobin 9.3, hematocrit 28.4, RDW 14.8, platelet count 226 Chest x-ray reviewed by me shows Stable small right pleural effusion and right lung nodular opacities. CT scan of abdomen and pelvis shows no evidence of urolithiasis, hydronephrosis, or other acute findings.Colonic diverticulosis, without radiographic evidence of diverticulitis.Stable moderate right pleural effusion. CT scan of the head without contrast showed no acute intracranial abnormality. Mild chronic microvascular ischemic change and cerebral volume loss. Twelve-lead EKG reviewed by me shows sinus rhythm.  Short PR interval.  Nonspecific T wave abnormalities. There are no new results to review at this time.  Assessment and Plan: * Recurrent right pleural effusion Patient presents to the ER for evaluation of worsening shortness of breath from her baseline. Imaging shows moderate right pleural effusion which is recurrent and concerning for malignant effusion IR consult for thoracentesis Continue oxygen supplementation Consult pulmonology  Paroxysmal atrial fibrillation (HCC) Continue amiodarone for rate control as well as low-dose beta-blockers Continue apixaban as primary prophylaxis for an acute stroke  Chronic systolic heart failure (HCC) Stable and not acutely exacerbated Last known LVEF of 35 to 40% with moderate aortic stenosis Continue torsemide and metoprolol  COPD, severe ([place]) Patient has a history of severe COPD with chronic respiratory failure on 4 L of oxygen continuous Place patient on as needed and scheduled bronchodilator therapy Continue inhaled  steroids   CKD (chronic kidney disease), stage IV (HCC) Stable  Monitor renal function closely during this hospitalization  Depression Stable Continue Celexa  GERD (gastroesophageal reflux disease) Stable  Continue PPI  Malignant neoplasm of lower third of esophagus ([place]) Patient has a history of esophageal cancer and is status post radiation therapy Follow up with oncology as an outpatient   Malignant neoplasm of upper lobe  of right lung [facility]) Patient has a history of adenocarcinoma of the right lung and is status post radiation therapy Follow-up with oncology as an outpatient      Advance Care Planning:   Code Status: Full Code   Consults: Pulmonary  Family Communication: Greater than 50% of time was spent discussing patient's condition and plan of care with her at the bedside.  All questions and concerns have been addressed with her in detail.  She verbalizes understanding and agrees with plan.  Severity of Illness: The appropriate patient status for this patient is INPATIENT. Inpatient status is judged to be reasonable and necessary in order to provide the required intensity of service to ensure the patient's safety. The patient's presenting symptoms, physical exam findings, and initial radiographic and laboratory data in the context of their chronic comorbidities is felt to place them at high risk for further clinical deterioration. Furthermore, it is not anticipated that the patient will be medically stable for discharge from the hospital within 2 midnights of admission.   * I certify that at the point of admission it is my clinical judgment that the patient will require inpatient hospital care spanning beyond 2 midnights from the point of admission due to high intensity of service, high risk for further deterioration and high frequency of surveillance required.*  Author: Collier Bullock, MD 05/08/2022 11:46 AM  For on call review [email or web address].

## 2022-05-08 NOTE — ED Notes (Signed)
Patient to IR for procedure

## 2022-05-08 NOTE — Assessment & Plan Note (Signed)
Stable  Monitor renal function closely during this hospitalization

## 2022-05-08 NOTE — Consult Note (Signed)
Pulmonary Medicine          Date: 05/08/2022,   MRN# [id number] Danielle [name] December 01, 1955     AdmissionWeight: 51.3 kg                 CurrentWeight: 51.3 kg      CHIEF COMPLAINT:   Sob, nosebleed, right pleural effusion.    HISTORY OF PRESENT ILLNESS   This is a 66 yr old lady here with the above complaints. She is known to have history of chronic systolic heart failure, history of adenocarcinoma of the right lung status post radiation therapy, history of stage IIIb chronic kidney disease, aortic valve stenosis, COPD with chronic respiratory failure on 4 L of oxygen continuous, history of left breast cancer status post mastectomy, history of esophageal cancer and on chronic anticoagulation. She was noted on w/u to have a large right pleural effusion. S/p thoracentesis. 1.2 liters of clear yellow fluid was removed without no incidence.    PAST MEDICAL HISTORY   Past Medical History:  Diagnosis Date   Anemia    Anxiety    Aortic atherosclerosis ([place])    Aortic valve stenosis 02/10/2018   a.) TTE 02/10/2018: EF 55-60%: mild AS with MPG of 12 mmHg. b.) TTE 04/21/2018: EF 35-40%; mild AS with MPG 8 mmHg. c.) TTE 11/07/2019: EF 50-55%; mild AS with MPG 10 mmHg.   Arthritis    Atrial fibrillation (HCC)    a.) CHA2DS2-VASc = 4 (age, sex, HTN, aortic plaque). b.) Rate/rhythm maintained on oral carvedilol; chronically anticoagulated on dose reduced apixaban. c.)  Attempted deployment of LAA occlusive device on 01/11/2021; parameters failed and procedure aborted.   Breast cancer, left ([place]) 2000   a.) T2N1M0; ER/PR (+) --> Tx'd with total mastectomy, LN resection, XRT, and chemotherapy   Cancer of right lung ([place]) 07/30/2016   a.) adenocarcinoma; ALK, ROS1, PDL1, BRAF, EGFR all negative.   Chronic diastolic CHF (congestive heart failure) ([place]) 11/26/2021   CKD (chronic kidney disease), stage IV (HCC)    COPD (chronic obstructive pulmonary disease) (HCC)    Dependence on  supplemental oxygen    Depression    Diastolic dysfunction 70/78/6754   a.) TTE 02/10/2017: EF 55-60%; G2DD. b.) TTE 04/21/2018: EF 35-40%; mild LA dilation; mod MV regurgitation. c.) TTE 11/07/2019: EF 50-55%; G1DD.   DOE (dyspnea on exertion)    GERD (gastroesophageal reflux disease)    Heart murmur    History of 2019 novel coronavirus disease (COVID-19) 10/14/2019   HLD (hyperlipidemia)    Hypertension    Long term current use of anticoagulant    a.) apixaban   Lymphedema    Personal history of chemotherapy    Personal history of radiation therapy    SBO (small bowel obstruction) ([place]) 11/05/2020   Vitamin D deficiency      SURGICAL HISTORY   Past Surgical History:  Procedure Laterality Date   Breast Biospy Left    [facility]   BREAST SURGERY     CARDIOVERSION N/A 02/23/2022   Procedure: CARDIOVERSION;  Surgeon: Corey Skains, MD;  Location: [facility] ORS;  Service: Cardiovascular;  Laterality: N/A;   COLONOSCOPY N/A 04/30/2018   Procedure: COLONOSCOPY;  Surgeon: Virgel Manifold, MD;  Location: [facility] ENDOSCOPY;  Service: Endoscopy;  Laterality: N/A;   COLONOSCOPY N/A 07/22/2018   Procedure: COLONOSCOPY;  Surgeon: Virgel Manifold, MD;  Location: [facility] ENDOSCOPY;  Service: Endoscopy;  Laterality: N/A;   COLONOSCOPY WITH PROPOFOL N/A 09/21/2021   Procedure: COLONOSCOPY  WITH PROPOFOL;  Surgeon: Benjamine Sprague, DO;  Location: [facility] ENDOSCOPY;  Service: General;  Laterality: N/A;   COLONOSCOPY WITH PROPOFOL N/A 03/11/2022   Procedure: COLONOSCOPY WITH PROPOFOL;  Surgeon: Annamaria Helling, DO;  Location: [facility] ENDOSCOPY;  Service: Gastroenterology;  Laterality: N/A;   DILATION AND CURETTAGE OF UTERUS     ELECTROMAGNETIC NAVIGATION BROCHOSCOPY Right 04/11/2016   Procedure: ELECTROMAGNETIC NAVIGATION BRONCHOSCOPY;  Surgeon: Vilinda Boehringer, MD;  Location: [facility] ORS;  Service: Cardiopulmonary;  Laterality: Right;   ESOPHAGOGASTRODUODENOSCOPY N/A 07/22/2018   Procedure:  ESOPHAGOGASTRODUODENOSCOPY (EGD);  Surgeon: Virgel Manifold, MD;  Location: [facility] ENDOSCOPY;  Service: Endoscopy;  Laterality: N/A;   ESOPHAGOGASTRODUODENOSCOPY (EGD) WITH PROPOFOL N/A 05/07/2018   Procedure: ESOPHAGOGASTRODUODENOSCOPY (EGD) WITH PROPOFOL;  Surgeon: Lucilla Lame, MD;  Location: [facility] ENDOSCOPY;  Service: Endoscopy;  Laterality: N/A;   ESOPHAGOGASTRODUODENOSCOPY (EGD) WITH PROPOFOL N/A 04/24/2019   Procedure: ESOPHAGOGASTRODUODENOSCOPY (EGD) WITH PROPOFOL;  Surgeon: Jonathon Bellows, MD;  Location: [facility] ENDOSCOPY;  Service: Gastroenterology;  Laterality: N/A;   ESOPHAGOGASTRODUODENOSCOPY (EGD) WITH PROPOFOL N/A 01/12/2020   Procedure: ESOPHAGOGASTRODUODENOSCOPY (EGD) WITH PROPOFOL;  Surgeon: Jonathon Bellows, MD;  Location: [facility] ENDOSCOPY;  Service: Gastroenterology;  Laterality: N/A;   ESOPHAGOGASTRODUODENOSCOPY (EGD) WITH PROPOFOL N/A 04/28/2020   Procedure: ESOPHAGOGASTRODUODENOSCOPY (EGD) WITH PROPOFOL;  Surgeon: Jonathon Bellows, MD;  Location: [facility] ENDOSCOPY;  Service: Gastroenterology;  Laterality: N/A;   ESOPHAGOGASTRODUODENOSCOPY (EGD) WITH PROPOFOL N/A 03/11/2022   Procedure: ESOPHAGOGASTRODUODENOSCOPY (EGD) WITH PROPOFOL;  Surgeon: Annamaria Helling, DO;  Location: [facility] ENDOSCOPY;  Service: Gastroenterology;  Laterality: N/A;   EUS N/A 05/07/2019   Procedure: FULL UPPER ENDOSCOPIC ULTRASOUND (EUS) RADIAL;  Surgeon: Jola Schmidt, MD;  Location: [facility] ENDOSCOPY;  Service: Endoscopy;  Laterality: N/A;   ILEOSCOPY N/A 07/22/2018   Procedure: ILEOSCOPY THROUGH STOMA;  Surgeon: Virgel Manifold, MD;  Location: [facility] ENDOSCOPY;  Service: Endoscopy;  Laterality: N/A;   ILEOSTOMY     ILEOSTOMY N/A 09/08/2018   Procedure: ILEOSTOMY REVISION POSSIBLE CREATION;  Surgeon: Herbert Pun, MD;  Location: [facility] ORS;  Service: General;  Laterality: N/A;   ILEOSTOMY CLOSURE N/A 08/15/2018   Procedure: DILATION OF ILEOSTOMY STRICTURE;  Surgeon: Herbert Pun, MD;  Location: [facility]  ORS;  Service: General;  Laterality: N/A;   LAPAROTOMY Right 05/04/2018   Procedure: EXPLORATORY LAPAROTOMY right colectomy right and left ostomy;  Surgeon: Herbert Pun, MD;  Location: [facility] ORS;  Service: General;  Laterality: Right;   LEFT ATRIAL APPENDAGE OCCLUSION N/A 01/11/2021   Procedure: LEFT ATRIAL APPENDAGE OCCLUSION ([place]); ABORTED PROCEDURE WITHOUT DEVICE BEING IMPLANTED; Location: [facility]; Surgeon: Mylinda Latina, MD   LUNG BIOPSY     MASTECTOMY Left    2000, [place] Right    [place]   XI ROBOTIC ASSISTED COLOSTOMY TAKEDOWN N/A 10/23/2021   Procedure: XI ROBOTIC ASSISTED ILEOSTOMY TAKEDOWN;  Surgeon: Herbert Pun, MD;  Location: [facility] ORS;  Service: General;  Laterality: N/A;  180 minutes for the surgery part please     FAMILY HISTORY   Family History  Problem Relation Age of Onset   Breast cancer Mother 73   Cancer Mother        Breast    Cirrhosis Father    Breast cancer Paternal Aunt 26   Cancer Maternal Aunt        Breast      SOCIAL HISTORY   Social History   Tobacco Use   Smoking status: Former    Packs/day: 0.50    Years: 20.00    Total pack years: 10.00  Types: Cigarettes    Quit date: 07/02/2012    Years since quitting: 9.8   Smokeless tobacco: Current    Types: Snuff  Vaping Use   Vaping Use: Never used  Substance Use Topics   Alcohol use: Yes    Alcohol/week: 3.0 standard drinks of alcohol    Types: 3 Cans of beer per week    Comment: Occasionally beer   Drug use: No     MEDICATIONS    Home Medication:  Current Outpatient Rx   Order #: [id number] Class: No Print   Order #: [id number] Class: Print   Order #: [id number] Class: Normal   Order #: [id number] Class: Historical Med   Order #: [id number] Class: Historical Med   Order #: [id number] Class: Normal   Order #: [id number] Class: Historical Med   Order #: [id number] Class: Normal   Order #: [id number] Class: Historical Med   Order #: [id number] Class:  Historical Med   Order #: [id number] Class: Normal   Order #: [id number] Class: Historical Med   Order #: [id number] Class: OTC   Order #: [id number] Class: Historical Med   Order #: [id number] Class: Print   Order #: [id number] Class: Historical Med   Order #: [id number] Class: Normal   Order #: [id number] Class: Historical Med    Current Medication:  Current Facility-Administered Medications:    0.9 %  sodium chloride infusion, 250 mL, Intravenous, PRN, Agbata, Tochukwu, MD   acetaminophen (TYLENOL) tablet 650 mg, 650 mg, Oral, Q4H PRN, Agbata, Tochukwu, MD   amiodarone (PACERONE) tablet 200 mg, 200 mg, Oral, BID, Agbata, Tochukwu, MD   apixaban (ELIQUIS) tablet 2.5 mg, 2.5 mg, Oral, BID, Agbata, Tochukwu, MD   atorvastatin (LIPITOR) tablet 40 mg, 40 mg, Oral, Daily, Agbata, Tochukwu, MD   citalopram (CELEXA) tablet 20 mg, 20 mg, Oral, Daily, Agbata, Tochukwu, MD   [START ON 05/09/2022] ferrous sulfate tablet 325 mg, 325 mg, Oral, Q breakfast, Agbata, Tochukwu, MD   fluticasone furoate-vilanterol (BREO ELLIPTA) 100-25 MCG/ACT 1 puff, 1 puff, Inhalation, Daily **AND** umeclidinium bromide (INCRUSE ELLIPTA) 62.5 MCG/ACT 1 puff, 1 puff, Inhalation, Daily, Agbata, Tochukwu, MD   ipratropium-albuterol (DUONEB) 0.5-2.5 (3) MG/3ML nebulizer solution 3 mL, 3 mL, Nebulization, Q6H PRN, Agbata, Tochukwu, MD   [START ON 05/09/2022] metoprolol tartrate (LOPRESSOR) tablet 12.5 mg, 12.5 mg, Oral, BID, Agbata, Tochukwu, MD   ondansetron (ZOFRAN) tablet 4 mg, 4 mg, Oral, Q6H PRN **OR** ondansetron (ZOFRAN) injection 4 mg, 4 mg, Intravenous, Q6H PRN, Agbata, Tochukwu, MD   pantoprazole (PROTONIX) EC tablet 40 mg, 40 mg, Oral, BID AC, Agbata, Tochukwu, MD   sodium bicarbonate tablet 1,300 mg, 1,300 mg, Oral, Daily, Agbata, Tochukwu, MD   sodium chloride flush (NS) 0.9 % injection 3 mL, 3 mL, Intravenous, Q12H, Agbata, Tochukwu, MD   sodium chloride flush (NS) 0.9 % injection 3 mL, 3 mL, Intravenous, PRN, Agbata, Tochukwu,  MD   [START ON 05/09/2022] torsemide (DEMADEX) tablet 40 mg, 40 mg, Oral, Daily, Agbata, Tochukwu, MD   zolpidem (AMBIEN) tablet 5 mg, 5 mg, Oral, QHS PRN, Agbata, Tochukwu, MD  Current Outpatient Medications:    acetaminophen (TYLENOL) 325 MG tablet, Take 2 tablets (650 mg total) by mouth every 6 (six) hours as needed for mild pain (or Fever >/= 101). (Patient taking differently: Take 650 mg by mouth every 4 (four) hours as needed for moderate pain.), Disp: , Rfl:    albuterol (PROVENTIL HFA;VENTOLIN HFA) 108 (90 Base) MCG/ACT inhaler, Inhale 2 puffs into the lungs every 6 (six) hours as needed for wheezing or shortness of breath., Disp: 1 Inhaler, Rfl: 2  amiodarone (PACERONE) 200 MG tablet, Take 1 tablet (200 mg total) by mouth 2 (two) times daily., Disp: 60 tablet, Rfl: 0   amLODipine (NORVASC) 5 MG tablet, Take 5 mg by mouth daily., Disp: , Rfl:    apixaban (ELIQUIS) 2.5 MG TABS tablet, Take 2.5 mg by mouth 2 (two) times daily., Disp: , Rfl:    atorvastatin (LIPITOR) 40 MG tablet, Take 1 tablet (40 mg total) by mouth daily., Disp: 30 tablet, Rfl: 2   calcium carbonate (TUMS - DOSED IN MG ELEMENTAL CALCIUM) 500 MG chewable tablet, Chew 1 tablet by mouth 2 (two) times daily as needed., Disp: , Rfl:    citalopram (CELEXA) 20 MG tablet, Take 1 tablet (20 mg total) by mouth daily., Disp: 30 tablet, Rfl: 2   ferrous sulfate 325 (65 FE) MG tablet, Take 325 mg by mouth daily with breakfast., Disp: , Rfl:    Fluticasone-Umeclidin-Vilant (TRELEGY ELLIPTA) 100-62.5-25 MCG/ACT AEPB, Inhale 1 puff into the lungs daily., Disp: , Rfl:    metoprolol tartrate (LOPRESSOR) 25 MG tablet, Take 1 tablet (25 mg total) by mouth 2 (two) times daily., Disp: 60 tablet, Rfl: 2   midodrine (PROAMATINE) 5 MG tablet, Take 5 mg by mouth 3 (three) times daily., Disp: , Rfl:    Nutritional Supplements (FEEDING SUPPLEMENT, NEPRO CARB STEADY,) LIQD, Take 237 mLs by mouth 3 (three) times daily between meals. (Patient not taking:  Reported on 04/12/2022), Disp: , Rfl: 0   OXYGEN, Inhale 3 L into the lungs as needed (shortness of breath)., Disp: , Rfl:    pantoprazole (PROTONIX) 40 MG tablet, Take 1 tablet (40 mg total) by mouth 2 (two) times daily before a meal., Disp: 60 tablet, Rfl: 2   sodium bicarbonate 650 MG tablet, Take 1,300 mg by mouth daily., Disp: , Rfl:    torsemide 40 MG TABS, Take 40 mg by mouth daily., Disp: 30 tablet, Rfl: 2   zolpidem (AMBIEN) 5 MG tablet, Take 5 mg by mouth at bedtime as needed for sleep., Disp: , Rfl:     ALLERGIES   Patient has no known allergies.     REVIEW OF SYSTEMS    Review of Systems:  Gen:  Denies  fever, sweats, chills weigh loss  HEENT: Denies blurred vision, double vision, ear pain, eye pain, hearing loss, nose bleeds, sore throat Cardiac:  No dizziness, chest pain or heaviness, chest tightness,edema Resp:   Denies cough or sputum porduction, less shortness of breath,wheezing, hemoptysis,  Gi: Denies swallowing difficulty, stomach pain, nausea or vomiting, diarrhea, constipation, bowel incontinence Gu:  Denies bladder incontinence, burning urine Ext:   Denies Joint pain, stiffness or swelling Skin: Denies  skin rash, easy bruising or bleeding or hives Endoc:  Denies polyuria, polydipsia , polyphagia or weight change Psych:   Denies depression, insomnia or hallucinations   Other:  All other systems negative   VS: BP (!) 145/73   Pulse 94   Temp 99.2 F (37.3 C) (Oral)   Resp (!) 26   Ht _0  (1.651 m)   Wt 51.3 kg   SpO2 95%   BMI 18.82 kg/m      PHYSICAL EXAM    GENERAL:NAD, no fevers, chills, no weakness no fatigue HEAD: Normocephalic, atraumatic.  EYES: Pupils equal, round, reactive to light. Extraocular muscles intact. No scleral icterus.  MOUTH: Moist mucosal membrane. Dentition intact. No abscess noted.  EAR, NOSE, THROAT: Clear without exudates. No external lesions.  NECK: Supple. No thyromegaly. No nodules. No JVD.  PULMONARY: little  decrease on the bs on the right CARDIOVASCULAR: S1 and S2. Regular rate and rhythm. No murmurs, rubs, or gallops. No edema. Pedal pulses 2+ bilaterally.  GASTROINTESTINAL: Soft, nontender, nondistended. No masses. Positive bowel sounds. No hepatosplenomegaly.  MUSCULOSKELETAL: No swelling, clubbing, or edema. Range of motion full in all extremities.  NEUROLOGIC: Cranial nerves II through XII are intact. No gross focal neurological deficits. Sensation intact. Reflexes intact.  SKIN: No ulceration, lesions, rashes, or cyanosis. Skin warm and dry. Turgor intact.  PSYCHIATRIC: Mood, affect within normal limits. The patient is awake, alert and oriented x 3. Insight, judgment intact.       IMAGING    DG Chest Port 1 View  Result Date: 05/08/2022 CLINICAL DATA:  Status post thoracentesis. EXAM: PORTABLE CHEST 1 VIEW COMPARISON:  Chest x-ray, same date. FINDINGS: Interval evacuation of the right pleural fluid collection. No postprocedural pneumothorax. IMPRESSION: Status post right thoracentesis. No residual pleural fluid and no pneumothorax. Electronically Signed   By: Marijo Sanes M.D.   On: 05/08/2022 12:18   US THORACENTESIS ASP PLEURAL SPACE W/IMG GUIDE  Result Date: 05/08/2022 INDICATION: Patient with recurrent right pleural effusion, request received for diagnostic and therapeutic thoracentesis. Patient with known atrial fibrillation, chronic kidney disease, history of adenocarcinoma of the right lung status post radiation and congestive heart failure. EXAM: ULTRASOUND GUIDED RIGHT THORACENTESIS MEDICATIONS: Local 1% lidocaine only. COMPLICATIONS: None immediate. PROCEDURE: An ultrasound guided thoracentesis was thoroughly discussed with the patient and questions answered. The benefits, risks, alternatives and complications were also discussed. The patient understands and wishes to proceed with the procedure. Written consent was obtained. Ultrasound was performed to localize and mark an adequate  pocket of fluid in the right chest. The area was then prepped and draped in the normal sterile fashion. 1% Lidocaine was used for local anesthesia. Under ultrasound guidance a 19 gauge, 7-cm, Yueh catheter was introduced. Thoracentesis was performed. The catheter was removed and a dressing applied. FINDINGS: A total of approximately 1.2 L of clear yellow fluid was removed. Samples were sent to the laboratory as requested by the clinical team. IMPRESSION: Successful ultrasound guided right thoracentesis yielding 1.2 L of pleural fluid. This exam was performed by Tsosie Billing PA-C, and was supervised and interpreted by Dr. Kathlene Cote. Electronically Signed   By: Aletta Edouard M.D.   On: 05/08/2022 12:10   CT ABDOMEN PELVIS WO CONTRAST  Result Date: 05/08/2022 CLINICAL DATA:  Acute non localized abdominal pain for several days. Anorexia. EXAM: CT ABDOMEN AND PELVIS WITHOUT CONTRAST TECHNIQUE: Multidetector CT imaging of the abdomen and pelvis was performed following the standard protocol without IV contrast. RADIATION DOSE REDUCTION: This exam was performed according to the departmental dose-optimization program which includes automated exposure control, adjustment of the mA and/or kV according to patient size and/or use of iterative reconstruction technique. COMPARISON:  04/13/2022 FINDINGS: Lower chest: Moderate right pleural effusion, without significant change. Hepatobiliary: Small hepatic cysts remains stable. No new or enlarging liver lesions seen on this unenhanced exam. High attenuation gallbladder sludge again noted, however there is no evidence of cholecystitis or biliary ductal dilatation. Pancreas: No mass or inflammatory process visualized on this unenhanced exam. Spleen:  Within normal limits in size. Adrenals/Urinary tract: No evidence of urolithiasis or hydronephrosis. Unremarkable unopacified urinary bladder. Stomach/Bowel: No evidence of obstruction, inflammatory process, or abnormal fluid  collections. Diverticulosis is seen mainly involving the sigmoid colon, however there is no evidence of diverticulitis. Vascular/Lymphatic: No pathologically enlarged lymph nodes identified. No evidence of  abdominal aortic aneurysm. Aortic atherosclerotic calcification incidentally noted. Reproductive: No mass or other significant abnormality. Bilateral tubal ligation clips are noted. Other:  None. Musculoskeletal:  No suspicious bone lesions identified. IMPRESSION: No evidence of urolithiasis, hydronephrosis, or other acute findings. Colonic diverticulosis, without radiographic evidence of diverticulitis. Stable moderate right pleural effusion. Electronically Signed   By: Marlaine Hind M.D.   On: 05/08/2022 09:12   CT HEAD WO CONTRAST (5MM)  Result Date: 05/08/2022 CLINICAL DATA:  Dizziness, persistent/recurrent, cardiac or vascular cause suspected EXAM: CT HEAD WITHOUT CONTRAST TECHNIQUE: Contiguous axial images were obtained from the base of the skull through the vertex without intravenous contrast. RADIATION DOSE REDUCTION: This exam was performed according to the departmental dose-optimization program which includes automated exposure control, adjustment of the mA and/or kV according to patient size and/or use of iterative reconstruction technique. COMPARISON:  01/29/2022 FINDINGS: Brain: No evidence of acute infarction, hemorrhage, hydrocephalus, extra-axial collection or mass lesion/mass effect. Scattered low-density changes within the periventricular and subcortical white matter compatible with chronic microvascular ischemic change. Mild diffuse cerebral volume loss. Vascular: Atherosclerotic calcifications involving the large vessels of the skull base. No unexpected hyperdense vessel. Skull: Normal. Negative for fracture or focal lesion. Sinuses/Orbits: No acute finding. Other: None. IMPRESSION: 1. No acute intracranial abnormality. 2. Mild chronic microvascular ischemic change and cerebral volume loss.  Electronically Signed   By: Davina Poke D.O.   On: 05/08/2022 09:10   DG Chest Portable 1 View  Result Date: 05/08/2022 CLINICAL DATA:  Shortness of breath EXAM: PORTABLE CHEST 1 VIEW COMPARISON:  Chest x-ray dated April 30, 2021 FINDINGS: Cardiac and mediastinal contours are unchanged. Background emphysema. Stable right perihilar and right upper lobe nodular opacities. No new parenchymal opacity. Stable small right pleural effusion. No evidence of pneumothorax. IMPRESSION: Stable small right pleural effusion and right lung nodular opacities. Electronically Signed   By: Yetta Glassman M.D.   On: 05/08/2022 08:34   DG Chest 2 View  Result Date: 04/30/2022 CLINICAL DATA:  Chest pressure. EXAM: CHEST - 2 VIEW COMPARISON:  Chest radiograph dated 04/13/2022. FINDINGS: Slight improvement of vascular congestion since the prior radiograph. Similar appearance of right pleural effusion and right lung base atelectasis. Pneumonia is not excluded. The left lung is clear. No pneumothorax. Mild cardiomegaly. Atherosclerotic calcification of the aorta. No acute osseous pathology. IMPRESSION: 1. Slight improvement of vascular congestion. 2. Similar appearance of right pleural effusion and right lung base atelectasis. Electronically Signed   By: Anner Crete M.D.   On: 04/30/2022 01:20   CT ABDOMEN PELVIS W CONTRAST  Result Date: 04/13/2022 CLINICAL DATA:  Abdominal pain EXAM: CT ABDOMEN AND PELVIS WITH CONTRAST TECHNIQUE: Multidetector CT imaging of the abdomen and pelvis was performed using the standard protocol following bolus administration of intravenous contrast. RADIATION DOSE REDUCTION: This exam was performed according to the departmental dose-optimization program which includes automated exposure control, adjustment of the mA and/or kV according to patient size and/or use of iterative reconstruction technique. CONTRAST:  30m OMNIPAQUE IOHEXOL 300 MG/ML  SOLN COMPARISON:  CT 11/26/2021, 02/21/2022  FINDINGS: Lower chest: Persistent moderate-sized layering right-sided pleural effusion with associated right basilar atelectasis. Mild cardiomegaly. Hepatobiliary: Stable small hepatic cysts. No new focal liver abnormality. Mildly decreased attenuation of the hepatic parenchyma. Gallbladder within normal limits. Pancreas: Unremarkable. No pancreatic ductal dilatation or surrounding inflammatory changes. Spleen: Normal in size without focal abnormality. Adrenals/Urinary Tract: Adrenal glands are unremarkable. Kidneys are within normal limits. Renal calculi, solid lesion, or hydronephrosis. Bladder is unremarkable. Stomach/Bowel:  Stomach within normal limits. No dilated loops of bowel. Postsurgical changes of prior right hemicolectomy. Left-sided colonic diverticulosis. No focal bowel wall thickening or inflammatory changes. Vascular/Lymphatic: Extensive atherosclerotic calcification throughout the aortoiliac axis. No aneurysm. No abdominopelvic lymphadenopathy. Reproductive: Uterus and bilateral adnexa are unremarkable. Other: No free fluid. No abdominopelvic fluid collection. No pneumoperitoneum. No abdominal wall hernia. Musculoskeletal: No acute or significant osseous findings. Chronic mild superior endplate compression deformities of L3 and L5. IMPRESSION: 1. No acute abdominopelvic findings. 2. Colonic diverticulosis without evidence of acute diverticulitis. 3. Persistent moderate-sized layering right-sided pleural effusion with associated right basilar atelectasis, similar to prior. 4. Aortic and coronary artery atherosclerosis (ICD10-I70.0). Electronically Signed   By: Davina Poke D.O.   On: 04/13/2022 15:57   DG Chest 2 View  Result Date: 04/13/2022 CLINICAL DATA:  PCP heard crackles and read low oxygen (46% w/out o2) and was concerned for fluid buildup, pt c/o chest pressure x3 days. Pt uses 3 liter's chonically. Pt 93% on 3 liter's currently. EXAM: CHEST - 2 VIEW COMPARISON:  03/09/2022 FINDINGS:  Persistent moderate right pleural effusion, with adjacent atelectasis/consolidation right lung base. Coarse right perihilar linear opacities as before. Mild central pulmonary vascular congestion, slightly increased since previous. Heart size upper limits normal. Aortic Atherosclerosis (ICD10-170.0). Old left rib fracture deformities. IMPRESSION: 1. Interval increase in mild pulmonary vascular congestion 2. Persistent right pleural effusion and right lower lung consolidation/atelectasis. Electronically Signed   By: Lucrezia Europe M.D.   On: 04/13/2022 12:09      ASSESSMENT/PLAN   1 right recurrent pleural effusion. Hs of right squamous cell, s/p xrt. Doubt in failure -out patient serial cxr -if rate of recurrence increases consider pleurovax catheter for out patient drainage vs pleurodesis.  -pl fluid culture, gm stain, chemistries, cell count  2 Very severe copd -continue Albuterol and trelegy or the equivalent -oxygen supplementation, keep sats 92 %  3 Status post left breast mastectomy (breat cancer), chronic left upper extremity lymphedema, now she has esophageal cancer, on going radiation -Follow up with oncology as scheduled   4. Status post left breast mastectomy (breat cancer), chronic left upper extremity lymphedema, now she has esophageal cancer, on going radiation -Follow up with oncology as scheduled    Thank you for allowing me to participate in the care of this patient.   Patient/Family are satisfied with care plan and all questions have been answered.  This document was prepared using Dragon voice recognition software and may include unintentional dictation errors.     Wallene Huh, M.D.  Division of [place]

## 2022-05-08 NOTE — ED Provider Notes (Addendum)
[facility] Provider Note    Event Date/Time   First MD Initiated Contact with Patient 05/08/22 [phone]     (approximate)   History   Epistaxis   HPI  Danielle [name] is a 66 y.o. female with history of A-fib on chronic oxygen for COPD, status post left breast mastectomy with chronic left upper extremity lymphedema, esophageal cancer undergoing radiation, lung cancer recurrent right malignant effusions who comes in with concerns for dizzyness.  Patient reports 2 days of just not eating due to just not feeling well overall.  She reports some epigastric abdominal pain and increasing shortness of breath.  She reports feeling dizzy when she tries to walk.  Denies any falls, hitting her head.  She does report still having her gallbladder.  No known fevers.  Patient is on 4 L of oxygen chronically     Physical Exam   Triage Vital Signs: ED Triage Vitals  Enc Vitals Group     BP 05/08/22 0755 140/80     Pulse Rate 05/08/22 0754 99     Resp 05/08/22 0754 18     Temp 05/08/22 0754 98.2 F (36.8 C)     Temp Source 05/08/22 0754 Oral     SpO2 05/08/22 0755 92 %     Weight 05/08/22 0756 113 lb 1.5 oz (51.3 kg)     Height 05/08/22 0756 5\' 5"  (1.651 m)     Head Circumference --      Peak Flow --      Pain Score 05/08/22 0755 7     Pain Loc --      Pain Edu? --      Excl. in [place]? --     Most recent vital signs: Vitals:   05/08/22 0754 05/08/22 0755  BP:  140/80  Pulse: 99   Resp: 18   Temp: 98.2 F (36.8 C)   SpO2:  92%     General: Awake, no distress.  CV:  Good peripheral perfusion.  Resp:  Normal effort.  Abd:  No distention.  Patient slightly tender in the upper abdomen.  Other:  Equal strength in arms and legs.  No obvious cranial nerve deficits.  She is got some lymphedema noted to the left arm but good distal pulse and on prior notes she has had this previously.  She has some increased work of breathing on 5 L of oxygen without any obvious  wheezing.   ED Results / Procedures / Treatments   Labs (all labs ordered are listed, but only abnormal results are displayed) Labs Reviewed  BASIC METABOLIC PANEL  CBC  URINALYSIS, ROUTINE W REFLEX MICROSCOPIC  CBG MONITORING, ED     EKG  My interpretation of EKG:  Normal sinus rate of 96 without any ST elevation, some ST depression in 2 3 aVF, normal intervals.  Similar to prior  RADIOLOGY I have reviewed the xray personally and interpreted and patient has small right pleural effusion  Ct head negative   CT abd negative   PROCEDURES:  Critical Care performed: No  .1-3 Lead EKG Interpretation  Performed by: Vanessa [place], MD Authorized by: Vanessa [place], MD     Interpretation: normal     ECG rate:  80   ECG rate assessment: normal     Rhythm: sinus rhythm     Ectopy: none     Conduction: normal      MEDICATIONS ORDERED IN ED: Medications - No data to display  IMPRESSION / MDM / ASSESSMENT AND PLAN / ED COURSE  I reviewed the triage vital signs and the nursing notes.   Patient's presentation is most consistent with acute presentation with potential threat to life or bodily function.   Differential includes intracranial hemorrhage, pleural effusion, ACS, intra-abdominal pathology given the upper abdominal discomfort.  Will get CTs, labs, chest x-ray to further evaluate.  CBC stable hemoglobin- white count normal  CMP normal - Cr improving  Inr normal  Trop slightly elevated will trend out  Covid negative   Reevaluated patient her respiratory rate was elevated and she has increased work of breathing stated that she still feels short of breath.  I suspect this is related to her moderate pleural effusion on the left therefore we will discuss the hospital team for admission for thoracentesis.  She also does report feeling little dizzy with walking but I suspect that once her breathing gets better that hopefully this will improve as well.  I have lower  suspicion for stroke at this time given no symptoms at rest..  No obvious wheezing on examination but given her extensive history of emphysema we will give a dose of steroids and DuoNeb to see if this helps.  There is no active nosebleed at this time and I suspect that this is just secondary to dry nasal passages due to her oxygen use and we discussed using some saline to keep her nose more moist  The patient is on the cardiac monitor to evaluate for evidence of arrhythmia and/or significant heart rate changes.      FINAL CLINICAL IMPRESSION(S) / ED DIAGNOSES   Final diagnoses:  Pleural effusion  Chronic respiratory failure with hypoxia ([place])     Rx / DC Orders   ED Discharge Orders     None        Note:  This document was prepared using Dragon voice recognition software and may include unintentional dictation errors.   Vanessa Sully, MD 05/08/22 1027    Vanessa , MD 05/08/22 1028

## 2022-05-08 NOTE — Assessment & Plan Note (Addendum)
Patient has a history of severe COPD with chronic respiratory failure on 4 L of oxygen continuous Place patient on as needed and scheduled bronchodilator therapy Continue inhaled steroids

## 2022-05-08 NOTE — Assessment & Plan Note (Signed)
Stable and not acutely exacerbated Last known LVEF of 35 to 40% with moderate aortic stenosis Continue torsemide and metoprolol

## 2022-05-08 NOTE — Assessment & Plan Note (Signed)
Stable.  Continue PPI. 

## 2022-05-08 NOTE — Assessment & Plan Note (Addendum)
Patient presents to the ER for evaluation of worsening shortness of breath from her baseline. Imaging shows moderate right pleural effusion which is recurrent and concerning for malignant effusion IR consult for thoracentesis Continue oxygen supplementation Consult pulmonology

## 2022-05-09 ENCOUNTER — Encounter: Payer: Self-pay | Admitting: Internal Medicine

## 2022-05-09 DIAGNOSIS — J90 Pleural effusion, not elsewhere classified: Secondary | ICD-10-CM | POA: Diagnosis not present

## 2022-05-09 LAB — PROTEIN, BODY FLUID (OTHER): Total Protein, Body Fluid Other: 2.9 g/dL

## 2022-05-09 LAB — BASIC METABOLIC PANEL
Anion gap: 10 (ref 5–15)
BUN: 30 mg/dL — ABNORMAL HIGH (ref 8–23)
CO2: 29 mmol/L (ref 22–32)
Calcium: 8.6 mg/dL — ABNORMAL LOW (ref 8.9–10.3)
Chloride: 97 mmol/L — ABNORMAL LOW (ref 98–111)
Creatinine, Ser: 1.74 mg/dL — ABNORMAL HIGH (ref 0.44–1.00)
GFR, Estimated: 32 mL/min — ABNORMAL LOW (ref >60)
Glucose, Bld: 125 mg/dL — ABNORMAL HIGH (ref 70–99)
Potassium: 3.5 mmol/L (ref 3.5–5.1)
Sodium: 136 mmol/L (ref 135–145)

## 2022-05-09 LAB — CBC
HCT: 23.9 % — ABNORMAL LOW (ref 36.0–46.0)
Hemoglobin: 8.3 g/dL — ABNORMAL LOW (ref 12.0–15.0)
MCH: 32.4 pg (ref 26.0–34.0)
MCHC: 34.7 g/dL (ref 30.0–36.0)
MCV: 93.4 fL (ref 80.0–100.0)
Platelets: 216 10*3/uL (ref 150–400)
RBC: 2.56 MIL/uL — ABNORMAL LOW (ref 3.87–5.11)
RDW: 14.3 % (ref 11.5–15.5)
WBC: 3.1 10*3/uL — ABNORMAL LOW (ref 4.0–10.5)
nRBC: 0 % (ref 0.0–0.2)

## 2022-05-09 MED ORDER — PANTOPRAZOLE SODIUM 40 MG PO TBEC
40.0000 mg | DELAYED_RELEASE_TABLET | Freq: Every day | ORAL | Status: DC
Start: 2022-05-10 — End: 2022-05-12
  Administered 2022-05-10 – 2022-05-12 (×3): 40 mg via ORAL
  Filled 2022-05-09 (×3): qty 1

## 2022-05-09 NOTE — Progress Notes (Signed)
Mobility Specialist - Progress Note    05/09/22 1000  Mobility  Activity Transferred to/from [facility]  Level of Assistance Standby assist, set-up cues, supervision of patient - no hands on  Assistive Device None  Distance Ambulated (ft) 2 ft  Activity Response Tolerated well  $Mobility charge 1 Mobility    Pt transferred to [facility] SBA voicing fatigue but tolerates well. Pt left on BSC with call bell in reach.  Merrily Brittle Mobility Specialist 05/09/22, 10:08 AM   Merrily Brittle Mobility Specialist 05/09/22, 10:08 AM

## 2022-05-09 NOTE — TOC Initial Note (Addendum)
Transition of Care [facility]) - Initial/Assessment Note    Patient Details  Name: Danielle [name] MRN: [id number] Date of Birth: 04/12/1956  [facility] CM/SW Contact:    Beverly Sessions, RN Phone Number: 05/09/2022, 2:57 PM  Clinical Narrative:                       Admitted for: Recurrent right pleural effusion Admitted from: Lives at home.  States her son and granddaughter live there PCP: Daryel Gerald Current home health/prior home health/DME: Home O2 through [employer], RW, [facility] and crutches   Patient states that she uses [facility] transport to appointments.  Daughter will transport at discharge   Patient states that her home fill unit isnt filling her portable tanks up all the way.  Zack with [employer] notified. Patient will need portable tank for DC  Referral made to [facility] with [facility] for [facility]   Patient Goals and CMS Choice        Expected Discharge Plan and Services                                                Prior Living Arrangements/Services                       Activities of Daily Living Home Assistive Devices/Equipment: Eyeglasses ADL Screening (condition at time of admission) Patient's cognitive ability adequate to safely complete daily activities?: Yes Is the patient deaf or have difficulty hearing?: No Does the patient have difficulty seeing, even when wearing glasses/contacts?: No Does the patient have difficulty concentrating, remembering, or making decisions?: No Patient able to express need for assistance with ADLs?: Yes Does the patient have difficulty dressing or bathing?: Yes Independently performs ADLs?: No Communication: Independent Dressing (OT): Needs assistance Is this a change from baseline?: Pre-admission baseline Grooming: Needs assistance Is this a change from baseline?: Pre-admission baseline Feeding: Independent Bathing: Needs assistance Is this a change from baseline?: Change from baseline, expected to last >3  days Toileting: Independent In/Out Bed: Independent Walks in Home: Independent Does the patient have difficulty walking or climbing stairs?: Yes Weakness of Legs: Both Weakness of Arms/Hands: None  Permission Sought/Granted                  Emotional Assessment              Admission diagnosis:  Epistaxis [R04.0] Pleural effusion [J90] S/P thoracentesis [Z98.890] Chronic respiratory failure with hypoxia (HCC) [J96.11] Recurrent right pleural effusion [J90] Chronic obstructive pulmonary disease, unspecified COPD type ([place]) [J44.9] Patient Active Problem List   Diagnosis Date Noted   Recurrent right pleural effusion 05/08/2022   Paroxysmal atrial fibrillation ([place]) 03/10/2022   Dyslipidemia 03/10/2022   Essential hypertension 03/10/2022   COPD, severe ([place]) 03/10/2022   Atrial fibrillation with RVR ([place]) 02/22/2022   Sore throat 02/02/2022   TIA (transient ischemic attack) 01/29/2022   Epistaxis 01/27/2022   Pleural effusion 01/26/2022   Known medical problems 01/26/2022   C. difficile colitis 11/27/2021   Abdominal pain 11/26/2021   HLD (hyperlipidemia) 11/26/2021   Chronic diastolic CHF (congestive heart failure) ([place]) 11/26/2021   Iron deficiency anemia 11/26/2021   Aspiration pneumonia of right lower lobe due to gastric secretions ([place])    Ileus following gastrointestinal surgery ([place])    Hypomagnesemia    Anemia  of chronic disease    Hyperphosphatemia 10/24/2021   Hypotension 11/17/2020   Ileostomy status ([place]) 11/17/2020   Acute shoulder pain due to trauma, left 11/17/2020   Syncope 11/17/2020   COPD with acute exacerbation ([place]) 08/01/2020   Atrial fibrillation with rapid ventricular response ([place]) 08/01/2020   Cellulitis of left arm 01/20/2020   CKD (chronic kidney disease), stage IV (HCC) 01/20/2020   Hypokalemia 01/20/2020   Elevated troponin 01/20/2020   Sepsis ([place]) 01/20/2020   Acute on chronic combined systolic and diastolic CHF (congestive  heart failure) ([place]) 01/20/2020   Pleural effusion due to CHF (congestive heart failure) ([place]) 11/06/2019   Diarrhea    Acute renal failure superimposed on stage 3b chronic kidney disease ([place]) 10/29/2019   GERD (gastroesophageal reflux disease) 10/29/2019   Depression 10/29/2019   Hyperkalemia 10/14/2019   History of 2019 novel coronavirus disease (COVID-19) 10/14/2019   Acute respiratory failure due to COVID-19 ([place]) 09/27/2019   Respiratory tract infection due to COVID-19 virus 09/27/2019   Acute blood loss anemia 06/20/2019   Normocytic anemia 06/20/2019   GI bleeding 06/19/2019   Anemia of chronic kidney failure, stage 4 (severe) ([place]) 06/12/2019   Malignant neoplasm of lower third of esophagus ([place]) 05/01/2019   Acute on chronic respiratory failure with hypoxia and hypercapnia ([place]) 12/22/2018   Ileostomy dysfunction ([place]) 09/28/2018   Reflux esophagitis    Iron deficiency anemia secondary to blood loss (chronic)    SBO (small bowel obstruction) ([place]) 25/36/6440   Chronic systolic heart failure ([place]) 06/09/2018   HTN (hypertension) 06/09/2018   Atrial flutter ([place]) 06/09/2018   Lymphedema 06/09/2018   Acute kidney injury superimposed on CKD ([place]) 06/04/2018   Severe protein-calorie malnutrition ([place]) 06/04/2018   Blood in stool    Focal (segmental) acute (reversible) ischemia of large intestine (HCC)    Ulceration of intestine    Diverticulosis of large intestine without diverticulitis    Abnormal CT scan, colon    Colitis    COPD exacerbation ([place])    Malnutrition of moderate degree 04/23/2018   Palliative care by specialist    DNR (do not resuscitate) discussion    Weakness    Acute on chronic respiratory failure with hypoxia ([place]) 04/21/2018   Hyponatremia 02/10/2017   Syncope and collapse 02/09/2017   Lung nodule, solitary 07/25/2016   Malignant neoplasm of upper lobe of right lung ([place]) 07/25/2016   Carcinoma of overlapping sites of left breast in female, estrogen  receptor positive ([place]) 06/22/2016   Multiple lung nodules 06/22/2016   Lesion of right lung    Breast cancer in female [employer]) 02/21/2016   Moderate COPD (chronic obstructive pulmonary disease) ([place]) 08/24/2014   PCP:  [name], [facility]:   [place], [place], [place] [address] [place] [address] Phone: [phone] Fax: [place], [place] [address] Phone: [phone] Fax: [phone]     Social Determinants of Health (SDOH) Interventions    Readmission Risk Interventions    01/29/2022   11:36 AM 11/29/2021   10:49 AM 10/16/2019    3:20 PM  Readmission Risk Prevention Plan  Transportation Screening Complete Complete Complete  Medication Review (RN Care Manager) Complete Complete Referral to Pharmacy  PCP or Specialist appointment within 3-5 days of discharge Complete Complete   HRI or Home Care Consult  Complete Complete  SW Recovery  Care/Counseling Consult Complete Complete Complete  Palliative Care Screening Not Applicable Not Applicable Not Applicable  Skilled Nursing Facility Not Applicable Not Applicable Not Applicable

## 2022-05-09 NOTE — Progress Notes (Signed)
Pulmonary Medicine          Date: 05/09/2022,   MRN# [id number] Danielle [name] 21-Nov-1955      HISTORY OF PRESENT ILLNESS   In bed comfortable, o2 on at 4 liters. At her dwelling she has a very long o2 cannula and may not be getting the desired fio2. Pleural is transudative. Told her it longs more cardiac in hind sight.    PAST MEDICAL HISTORY   Past Medical History:  Diagnosis Date   Anemia    Anxiety    Aortic atherosclerosis ([place])    Aortic valve stenosis 02/10/2018   a.) TTE 02/10/2018: EF 55-60%: mild AS with MPG of 12 mmHg. b.) TTE 04/21/2018: EF 35-40%; mild AS with MPG 8 mmHg. c.) TTE 11/07/2019: EF 50-55%; mild AS with MPG 10 mmHg.   Arthritis    Atrial fibrillation (HCC)    a.) CHA2DS2-VASc = 4 (age, sex, HTN, aortic plaque). b.) Rate/rhythm maintained on oral carvedilol; chronically anticoagulated on dose reduced apixaban. c.)  Attempted deployment of LAA occlusive device on 01/11/2021; parameters failed and procedure aborted.   Breast cancer, left ([place]) 2000   a.) T2N1M0; ER/PR (+) --> Tx'd with total mastectomy, LN resection, XRT, and chemotherapy   Cancer of right lung ([place]) 07/30/2016   a.) adenocarcinoma; ALK, ROS1, PDL1, BRAF, EGFR all negative.   Chronic diastolic CHF (congestive heart failure) ([place]) 11/26/2021   CKD (chronic kidney disease), stage IV (HCC)    COPD (chronic obstructive pulmonary disease) (HCC)    Dependence on supplemental oxygen    Depression    Diastolic dysfunction 28/31/5176   a.) TTE 02/10/2017: EF 55-60%; G2DD. b.) TTE 04/21/2018: EF 35-40%; mild LA dilation; mod MV regurgitation. c.) TTE 11/07/2019: EF 50-55%; G1DD.   DOE (dyspnea on exertion)    GERD (gastroesophageal reflux disease)    Heart murmur    History of 2019 novel coronavirus disease (COVID-19) 10/14/2019   HLD (hyperlipidemia)    Hypertension    Long term current use of anticoagulant    a.) apixaban   Lymphedema    Personal history of chemotherapy     Personal history of radiation therapy    SBO (small bowel obstruction) ([place]) 11/05/2020   Vitamin D deficiency      SURGICAL HISTORY   Past Surgical History:  Procedure Laterality Date   Breast Biospy Left    [facility]   BREAST SURGERY     CARDIOVERSION N/A 02/23/2022   Procedure: CARDIOVERSION;  Surgeon: Corey Skains, MD;  Location: [facility] ORS;  Service: Cardiovascular;  Laterality: N/A;   COLONOSCOPY N/A 04/30/2018   Procedure: COLONOSCOPY;  Surgeon: Virgel Manifold, MD;  Location: [facility] ENDOSCOPY;  Service: Endoscopy;  Laterality: N/A;   COLONOSCOPY N/A 07/22/2018   Procedure: COLONOSCOPY;  Surgeon: Virgel Manifold, MD;  Location: [facility] ENDOSCOPY;  Service: Endoscopy;  Laterality: N/A;   COLONOSCOPY WITH PROPOFOL N/A 09/21/2021   Procedure: COLONOSCOPY WITH PROPOFOL;  Surgeon: Benjamine Sprague, DO;  Location: [facility] ENDOSCOPY;  Service: General;  Laterality: N/A;   COLONOSCOPY WITH PROPOFOL N/A 03/11/2022   Procedure: COLONOSCOPY WITH PROPOFOL;  Surgeon: Annamaria Helling, DO;  Location: [facility] ENDOSCOPY;  Service: Gastroenterology;  Laterality: N/A;   DILATION AND CURETTAGE OF UTERUS     ELECTROMAGNETIC NAVIGATION BROCHOSCOPY Right 04/11/2016   Procedure: ELECTROMAGNETIC NAVIGATION BRONCHOSCOPY;  Surgeon: Vilinda Boehringer, MD;  Location: [facility] ORS;  Service: Cardiopulmonary;  Laterality: Right;   ESOPHAGOGASTRODUODENOSCOPY N/A 07/22/2018   Procedure: ESOPHAGOGASTRODUODENOSCOPY (EGD);  Surgeon: Bonna Gains,  Lennette Bihari, MD;  Location: [facility] ENDOSCOPY;  Service: Endoscopy;  Laterality: N/A;   ESOPHAGOGASTRODUODENOSCOPY (EGD) WITH PROPOFOL N/A 05/07/2018   Procedure: ESOPHAGOGASTRODUODENOSCOPY (EGD) WITH PROPOFOL;  Surgeon: Lucilla Lame, MD;  Location: [facility] ENDOSCOPY;  Service: Endoscopy;  Laterality: N/A;   ESOPHAGOGASTRODUODENOSCOPY (EGD) WITH PROPOFOL N/A 04/24/2019   Procedure: ESOPHAGOGASTRODUODENOSCOPY (EGD) WITH PROPOFOL;  Surgeon: Jonathon Bellows, MD;  Location: [facility] ENDOSCOPY;  Service:  Gastroenterology;  Laterality: N/A;   ESOPHAGOGASTRODUODENOSCOPY (EGD) WITH PROPOFOL N/A 01/12/2020   Procedure: ESOPHAGOGASTRODUODENOSCOPY (EGD) WITH PROPOFOL;  Surgeon: Jonathon Bellows, MD;  Location: [facility] ENDOSCOPY;  Service: Gastroenterology;  Laterality: N/A;   ESOPHAGOGASTRODUODENOSCOPY (EGD) WITH PROPOFOL N/A 04/28/2020   Procedure: ESOPHAGOGASTRODUODENOSCOPY (EGD) WITH PROPOFOL;  Surgeon: Jonathon Bellows, MD;  Location: [facility] ENDOSCOPY;  Service: Gastroenterology;  Laterality: N/A;   ESOPHAGOGASTRODUODENOSCOPY (EGD) WITH PROPOFOL N/A 03/11/2022   Procedure: ESOPHAGOGASTRODUODENOSCOPY (EGD) WITH PROPOFOL;  Surgeon: Annamaria Helling, DO;  Location: [facility] ENDOSCOPY;  Service: Gastroenterology;  Laterality: N/A;   EUS N/A 05/07/2019   Procedure: FULL UPPER ENDOSCOPIC ULTRASOUND (EUS) RADIAL;  Surgeon: Jola Schmidt, MD;  Location: [facility] ENDOSCOPY;  Service: Endoscopy;  Laterality: N/A;   ILEOSCOPY N/A 07/22/2018   Procedure: ILEOSCOPY THROUGH STOMA;  Surgeon: Virgel Manifold, MD;  Location: [facility] ENDOSCOPY;  Service: Endoscopy;  Laterality: N/A;   ILEOSTOMY     ILEOSTOMY N/A 09/08/2018   Procedure: ILEOSTOMY REVISION POSSIBLE CREATION;  Surgeon: Herbert Pun, MD;  Location: [facility] ORS;  Service: General;  Laterality: N/A;   ILEOSTOMY CLOSURE N/A 08/15/2018   Procedure: DILATION OF ILEOSTOMY STRICTURE;  Surgeon: Herbert Pun, MD;  Location: [facility] ORS;  Service: General;  Laterality: N/A;   LAPAROTOMY Right 05/04/2018   Procedure: EXPLORATORY LAPAROTOMY right colectomy right and left ostomy;  Surgeon: Herbert Pun, MD;  Location: [facility] ORS;  Service: General;  Laterality: Right;   LEFT ATRIAL APPENDAGE OCCLUSION N/A 01/11/2021   Procedure: LEFT ATRIAL APPENDAGE OCCLUSION ([place]); ABORTED PROCEDURE WITHOUT DEVICE BEING IMPLANTED; Location: [facility]; Surgeon: Mylinda Latina, MD   LUNG BIOPSY     MASTECTOMY Left    2000, [place] Right    Day   XI  ROBOTIC ASSISTED COLOSTOMY TAKEDOWN N/A 10/23/2021   Procedure: XI ROBOTIC ASSISTED ILEOSTOMY TAKEDOWN;  Surgeon: Herbert Pun, MD;  Location: [facility] ORS;  Service: General;  Laterality: N/A;  180 minutes for the surgery part please     FAMILY HISTORY   Family History  Problem Relation Age of Onset   Breast cancer Mother 21   Cancer Mother        Breast    Cirrhosis Father    Breast cancer Paternal Aunt 28   Cancer Maternal Aunt        Breast      SOCIAL HISTORY   Social History   Tobacco Use   Smoking status: Former    Packs/day: 0.50    Years: 20.00    Total pack years: 10.00    Types: Cigarettes    Quit date: 07/02/2012    Years since quitting: 9.8   Smokeless tobacco: Current    Types: Snuff  Vaping Use   Vaping Use: Never used  Substance Use Topics   Alcohol use: Yes    Alcohol/week: 3.0 standard drinks of alcohol    Types: 3 Cans of beer per week    Comment: Occasionally beer   Drug use: No     MEDICATIONS    Home Medication:    Current Medication:  Current Facility-Administered Medications:  0.9 %  sodium chloride infusion, 250 mL, Intravenous, PRN, Agbata, Tochukwu, MD   acetaminophen (TYLENOL) tablet 650 mg, 650 mg, Oral, Q4H PRN, Agbata, Tochukwu, MD, 650 mg at 05/09/22 1143   amiodarone (PACERONE) tablet 200 mg, 200 mg, Oral, BID, Agbata, Tochukwu, MD, 200 mg at 05/09/22 0919   apixaban (ELIQUIS) tablet 2.5 mg, 2.5 mg, Oral, BID, Agbata, Tochukwu, MD, 2.5 mg at 05/09/22 0919   atorvastatin (LIPITOR) tablet 40 mg, 40 mg, Oral, QPM, Agbata, Tochukwu, MD, 40 mg at 05/08/22 1855   citalopram (CELEXA) tablet 20 mg, 20 mg, Oral, Daily, Agbata, Tochukwu, MD, 20 mg at 05/09/22 0919   ferrous sulfate tablet 325 mg, 325 mg, Oral, Q breakfast, Agbata, Tochukwu, MD, 325 mg at 05/09/22 0919   fluticasone furoate-vilanterol (BREO ELLIPTA) 100-25 MCG/ACT 1 puff, 1 puff, Inhalation, Daily, 1 puff at 05/09/22 1056 **AND** umeclidinium bromide (INCRUSE  ELLIPTA) 62.5 MCG/ACT 1 puff, 1 puff, Inhalation, Daily, Agbata, Tochukwu, MD, 1 puff at 05/09/22 1056   ipratropium-albuterol (DUONEB) 0.5-2.5 (3) MG/3ML nebulizer solution 3 mL, 3 mL, Nebulization, Q6H PRN, Agbata, Tochukwu, MD   metoprolol tartrate (LOPRESSOR) tablet 12.5 mg, 12.5 mg, Oral, BID, Agbata, Tochukwu, MD, 12.5 mg at 05/09/22 0919   ondansetron (ZOFRAN) tablet 4 mg, 4 mg, Oral, Q6H PRN **OR** ondansetron (ZOFRAN) injection 4 mg, 4 mg, Intravenous, Q6H PRN, Agbata, Tochukwu, MD   [START ON 05/10/2022] pantoprazole (PROTONIX) EC tablet 40 mg, 40 mg, Oral, Daily, Nazari, Walid A, RPH   sodium bicarbonate tablet 1,300 mg, 1,300 mg, Oral, Daily, Agbata, Tochukwu, MD, 1,300 mg at 05/09/22 0919   sodium chloride flush (NS) 0.9 % injection 3 mL, 3 mL, Intravenous, Q12H, Agbata, Tochukwu, MD, 3 mL at 05/09/22 1144   sodium chloride flush (NS) 0.9 % injection 3 mL, 3 mL, Intravenous, PRN, Agbata, Tochukwu, MD   torsemide (DEMADEX) tablet 40 mg, 40 mg, Oral, Daily, Agbata, Tochukwu, MD, 40 mg at 05/09/22 0919   zolpidem (AMBIEN) tablet 5 mg, 5 mg, Oral, QHS PRN, Agbata, Tochukwu, MD, 5 mg at 05/08/22 2255    ALLERGIES   Patient has no known allergies.     REVIEW OF SYSTEMS    Review of Systems:  Gen:  Denies  fever, sweats, chills weigh loss  HEENT: Denies blurred vision, double vision, ear pain, eye pain, hearing loss, nose bleeds, sore throat Cardiac:  No dizziness, chest pain or heaviness, chest tightness,edema Resp:   Denies cough or sputum porduction, less shortness of breath,wheezing, no hemoptysis,  Gi: Denies swallowing difficulty, stomach pain, nausea or vomiting, diarrhea, constipation, bowel incontinence Gu:  Denies bladder incontinence, burning urine Ext:   Denies Joint pain, stiffness or swelling Skin: Denies  skin rash, easy bruising or bleeding or hives Endoc:  Denies polyuria, polydipsia , polyphagia or weight change Psych:   Denies depression, insomnia or  hallucinations   Other:  All other systems negative   VS: BP 121/61 (BP Location: Right Arm)   Pulse 87   Temp 98.1 F (36.7 C) (Oral)   Resp 18   Ht _0  (1.651 m)   Wt 51.9 kg   SpO2 95%   BMI 19.04 kg/m      PHYSICAL EXAM    GENERAL:NAD, no fevers, chills, no weakness no fatigue HEAD: Normocephalic, atraumatic.  EYES: Pupils equal, round, reactive to light. Extraocular muscles intact. No scleral icterus.  MOUTH: Moist mucosal membrane. Dentition intact. No abscess noted.  EAR, NOSE, THROAT: Clear without exudates. No external lesions.  NECK: Supple.  No thyromegaly. No nodules. No JVD.  PULMONARY: moving air, no use of accessory muscles CARDIOVASCULAR: S1 and S2. Regular rate and rhythm. No murmurs, rubs, or gallops. No edema. Pedal pulses 2+ bilaterally.  GASTROINTESTINAL: Soft, nontender, nondistended. No masses. Positive bowel sounds. No hepatosplenomegaly.  MUSCULOSKELETAL: No swelling, clubbing, or edema. Range of motion full in all extremities.  NEUROLOGIC: Cranial nerves II through XII are intact. No gross focal neurological deficits. Sensation intact. Reflexes intact.  SKIN: No ulceration, lesions, rashes, or cyanosis. Skin warm and dry. Turgor intact.  PSYCHIATRIC: Mood, affect within normal limits. The patient is awake, alert and oriented x 3. Insight, judgment intact.       IMAGING    DG Chest Port 1 View  Result Date: 05/08/2022 CLINICAL DATA:  Status post thoracentesis. EXAM: PORTABLE CHEST 1 VIEW COMPARISON:  Chest x-ray, same date. FINDINGS: Interval evacuation of the right pleural fluid collection. No postprocedural pneumothorax. IMPRESSION: Status post right thoracentesis. No residual pleural fluid and no pneumothorax. Electronically Signed   By: Marijo Sanes M.D.   On: 05/08/2022 12:18   US THORACENTESIS ASP PLEURAL SPACE W/IMG GUIDE  Result Date: 05/08/2022 INDICATION: Patient with recurrent right pleural effusion, request received for diagnostic  and therapeutic thoracentesis. Patient with known atrial fibrillation, chronic kidney disease, history of adenocarcinoma of the right lung status post radiation and congestive heart failure. EXAM: ULTRASOUND GUIDED RIGHT THORACENTESIS MEDICATIONS: Local 1% lidocaine only. COMPLICATIONS: None immediate. PROCEDURE: An ultrasound guided thoracentesis was thoroughly discussed with the patient and questions answered. The benefits, risks, alternatives and complications were also discussed. The patient understands and wishes to proceed with the procedure. Written consent was obtained. Ultrasound was performed to localize and mark an adequate pocket of fluid in the right chest. The area was then prepped and draped in the normal sterile fashion. 1% Lidocaine was used for local anesthesia. Under ultrasound guidance a 19 gauge, 7-cm, Yueh catheter was introduced. Thoracentesis was performed. The catheter was removed and a dressing applied. FINDINGS: A total of approximately 1.2 L of clear yellow fluid was removed. Samples were sent to the laboratory as requested by the clinical team. IMPRESSION: Successful ultrasound guided right thoracentesis yielding 1.2 L of pleural fluid. This exam was performed by Tsosie Billing PA-C, and was supervised and interpreted by Dr. Kathlene Cote. Electronically Signed   By: Aletta Edouard M.D.   On: 05/08/2022 12:10   CT ABDOMEN PELVIS WO CONTRAST  Result Date: 05/08/2022 CLINICAL DATA:  Acute non localized abdominal pain for several days. Anorexia. EXAM: CT ABDOMEN AND PELVIS WITHOUT CONTRAST TECHNIQUE: Multidetector CT imaging of the abdomen and pelvis was performed following the standard protocol without IV contrast. RADIATION DOSE REDUCTION: This exam was performed according to the departmental dose-optimization program which includes automated exposure control, adjustment of the mA and/or kV according to patient size and/or use of iterative reconstruction technique. COMPARISON:  04/13/2022  FINDINGS: Lower chest: Moderate right pleural effusion, without significant change. Hepatobiliary: Small hepatic cysts remains stable. No new or enlarging liver lesions seen on this unenhanced exam. High attenuation gallbladder sludge again noted, however there is no evidence of cholecystitis or biliary ductal dilatation. Pancreas: No mass or inflammatory process visualized on this unenhanced exam. Spleen:  Within normal limits in size. Adrenals/Urinary tract: No evidence of urolithiasis or hydronephrosis. Unremarkable unopacified urinary bladder. Stomach/Bowel: No evidence of obstruction, inflammatory process, or abnormal fluid collections. Diverticulosis is seen mainly involving the sigmoid colon, however there is no evidence of diverticulitis. Vascular/Lymphatic: No pathologically enlarged lymph  nodes identified. No evidence of abdominal aortic aneurysm. Aortic atherosclerotic calcification incidentally noted. Reproductive: No mass or other significant abnormality. Bilateral tubal ligation clips are noted. Other:  None. Musculoskeletal:  No suspicious bone lesions identified. IMPRESSION: No evidence of urolithiasis, hydronephrosis, or other acute findings. Colonic diverticulosis, without radiographic evidence of diverticulitis. Stable moderate right pleural effusion. Electronically Signed   By: Marlaine Hind M.D.   On: 05/08/2022 09:12   CT HEAD WO CONTRAST (5MM)  Result Date: 05/08/2022 CLINICAL DATA:  Dizziness, persistent/recurrent, cardiac or vascular cause suspected EXAM: CT HEAD WITHOUT CONTRAST TECHNIQUE: Contiguous axial images were obtained from the base of the skull through the vertex without intravenous contrast. RADIATION DOSE REDUCTION: This exam was performed according to the departmental dose-optimization program which includes automated exposure control, adjustment of the mA and/or kV according to patient size and/or use of iterative reconstruction technique. COMPARISON:  01/29/2022 FINDINGS:  Brain: No evidence of acute infarction, hemorrhage, hydrocephalus, extra-axial collection or mass lesion/mass effect. Scattered low-density changes within the periventricular and subcortical white matter compatible with chronic microvascular ischemic change. Mild diffuse cerebral volume loss. Vascular: Atherosclerotic calcifications involving the large vessels of the skull base. No unexpected hyperdense vessel. Skull: Normal. Negative for fracture or focal lesion. Sinuses/Orbits: No acute finding. Other: None. IMPRESSION: 1. No acute intracranial abnormality. 2. Mild chronic microvascular ischemic change and cerebral volume loss. Electronically Signed   By: Davina Poke D.O.   On: 05/08/2022 09:10   DG Chest Portable 1 View  Result Date: 05/08/2022 CLINICAL DATA:  Shortness of breath EXAM: PORTABLE CHEST 1 VIEW COMPARISON:  Chest x-ray dated April 30, 2021 FINDINGS: Cardiac and mediastinal contours are unchanged. Background emphysema. Stable right perihilar and right upper lobe nodular opacities. No new parenchymal opacity. Stable small right pleural effusion. No evidence of pneumothorax. IMPRESSION: Stable small right pleural effusion and right lung nodular opacities. Electronically Signed   By: Yetta Glassman M.D.   On: 05/08/2022 08:34   DG Chest 2 View  Result Date: 04/30/2022 CLINICAL DATA:  Chest pressure. EXAM: CHEST - 2 VIEW COMPARISON:  Chest radiograph dated 04/13/2022. FINDINGS: Slight improvement of vascular congestion since the prior radiograph. Similar appearance of right pleural effusion and right lung base atelectasis. Pneumonia is not excluded. The left lung is clear. No pneumothorax. Mild cardiomegaly. Atherosclerotic calcification of the aorta. No acute osseous pathology. IMPRESSION: 1. Slight improvement of vascular congestion. 2. Similar appearance of right pleural effusion and right lung base atelectasis. Electronically Signed   By: Anner Crete M.D.   On: 04/30/2022 01:20    CT ABDOMEN PELVIS W CONTRAST  Result Date: 04/13/2022 CLINICAL DATA:  Abdominal pain EXAM: CT ABDOMEN AND PELVIS WITH CONTRAST TECHNIQUE: Multidetector CT imaging of the abdomen and pelvis was performed using the standard protocol following bolus administration of intravenous contrast. RADIATION DOSE REDUCTION: This exam was performed according to the departmental dose-optimization program which includes automated exposure control, adjustment of the mA and/or kV according to patient size and/or use of iterative reconstruction technique. CONTRAST:  87m OMNIPAQUE IOHEXOL 300 MG/ML  SOLN COMPARISON:  CT 11/26/2021, 02/21/2022 FINDINGS: Lower chest: Persistent moderate-sized layering right-sided pleural effusion with associated right basilar atelectasis. Mild cardiomegaly. Hepatobiliary: Stable small hepatic cysts. No new focal liver abnormality. Mildly decreased attenuation of the hepatic parenchyma. Gallbladder within normal limits. Pancreas: Unremarkable. No pancreatic ductal dilatation or surrounding inflammatory changes. Spleen: Normal in size without focal abnormality. Adrenals/Urinary Tract: Adrenal glands are unremarkable. Kidneys are within normal limits. Renal calculi, solid lesion, or  hydronephrosis. Bladder is unremarkable. Stomach/Bowel: Stomach within normal limits. No dilated loops of bowel. Postsurgical changes of prior right hemicolectomy. Left-sided colonic diverticulosis. No focal bowel wall thickening or inflammatory changes. Vascular/Lymphatic: Extensive atherosclerotic calcification throughout the aortoiliac axis. No aneurysm. No abdominopelvic lymphadenopathy. Reproductive: Uterus and bilateral adnexa are unremarkable. Other: No free fluid. No abdominopelvic fluid collection. No pneumoperitoneum. No abdominal wall hernia. Musculoskeletal: No acute or significant osseous findings. Chronic mild superior endplate compression deformities of L3 and L5. IMPRESSION: 1. No acute abdominopelvic  findings. 2. Colonic diverticulosis without evidence of acute diverticulitis. 3. Persistent moderate-sized layering right-sided pleural effusion with associated right basilar atelectasis, similar to prior. 4. Aortic and coronary artery atherosclerosis (ICD10-I70.0). Electronically Signed   By: Davina Poke D.O.   On: 04/13/2022 15:57   DG Chest 2 View  Result Date: 04/13/2022 CLINICAL DATA:  PCP heard crackles and read low oxygen (46% w/out o2) and was concerned for fluid buildup, pt c/o chest pressure x3 days. Pt uses 3 liter's chonically. Pt 93% on 3 liter's currently. EXAM: CHEST - 2 VIEW COMPARISON:  03/09/2022 FINDINGS: Persistent moderate right pleural effusion, with adjacent atelectasis/consolidation right lung base. Coarse right perihilar linear opacities as before. Mild central pulmonary vascular congestion, slightly increased since previous. Heart size upper limits normal. Aortic Atherosclerosis (ICD10-170.0). Old left rib fracture deformities. IMPRESSION: 1. Interval increase in mild pulmonary vascular congestion 2. Persistent right pleural effusion and right lower lung consolidation/atelectasis. Electronically Signed   By: Lucrezia Europe M.D.   On: 04/13/2022 12:09    Tot 2.9, ldh 53, gluc 87  ASSESSMENT/PLAN   1 right recurrent pleural effusion. S/p right thoracentesis, feeling better. Hs of right squamous cell, s/p xrt. Transudate. D/dx cardiac ( ef 35 %, elevated BNP), doubt low protein (typically leak fluid or third space when the albumen is less 1.2-1,6 or so). She has renal insufficiency no peripheral edema doiubt renal a major cause. Profound hypothyroidism can also cause bilateral pleural effusions. Not the case. -out patient serial cxr -cautious diuresis -cardiology consult   2 Very severe copd -continue Albuterol and trelegy or the equivalent -oxygen supplementation, keep sats 92 %   3 Status post left breast mastectomy (breat cancer), chronic left upper extremity lymphedema,  now she has esophageal cancer, on going radiation -Follow up with oncology as scheduled       Thank you for allowing me to participate in the care of this patient.   Patient/Family are satisfied with care plan and all questions have been answered.  This document was prepared using Dragon voice recognition software and may include unintentional dictation errors.     Wallene Huh, M.D.  Division of [place]

## 2022-05-09 NOTE — Progress Notes (Signed)
PROGRESS NOTE    Danielle [name]  HCW:[id number] DOB: [birth year] DOA: 05/08/2022 PCP: [name], PA   Brief Narrative:  Danielle [name] is a 66 y.o. female with medical history significant for A-fib on chronic anticoagulation therapy, history of chronic systolic heart failure, history of adenocarcinoma of the right lung status post radiation therapy, history of stage IIIb chronic kidney disease, aortic valve stenosis, COPD with chronic respiratory failure on 4 L of oxygen continuous, history of left breast cancer status post mastectomy, history of esophageal cancer who presents to the ER for evaluation of worsening shortness of breath from her baseline with recurrent moderate right pleural effusion  Assessment & Plan:   Principal Problem:   Recurrent right pleural effusion Active Problems:   Paroxysmal atrial fibrillation (HCC)   Chronic systolic heart failure (HCC)   Malignant neoplasm of upper lobe of right lung (HCC)   Malignant neoplasm of lower third of esophagus (HCC)   GERD (gastroesophageal reflux disease)   Depression   CKD (chronic kidney disease), stage IV (HCC)   COPD, severe (HCC)   Recurrent right pleural effusion Transudative by appearance - > likely heart failure vs less likely protein/nephrotic in nature Pulm following - appreciate insight/recs   Paroxysmal atrial fibrillation (HCC) Continue amiodarone for rate control as well as low-dose beta-blockers Continue apixaban as primary prophylaxis for an acute stroke   Chronic systolic heart failure (HCC) Stable and not acutely exacerbated Concern above effusion related to poor diet/lifestyle regimen Last known LVEF of 35 to 40% with moderate aortic stenosis Continue torsemide and metoprolol  COPD, severe ([place]), not in acute exacerbation Patient has a history of severe COPD with chronic respiratory failure on 4 L of oxygen continuous Place patient on as needed and scheduled bronchodilator therapy Continue  inhaled steroids Continue ambulatory oxygen screening  CKD (chronic kidney disease), stage IV (HCC) Stable  Monitor renal function closely during this hospitalization  Depression Stable Continue Celexa  GERD (gastroesophageal reflux disease) Stable  Continue PPI  Malignant neoplasm of lower third of esophagus (HCC) Patient has a history of esophageal cancer and is status post radiation therapy Follow up with oncology as an outpatient   Malignant neoplasm of upper lobe of right lung ([place]) Patient has a history of adenocarcinoma of the right lung and is status post radiation therapy Follow-up with oncology as an outpatient  DVT prophylaxis: eliquis Code Status: Full Family Communication: None present  Status is: Inpt  Dispo: The patient is from: Home              Anticipated d/c is to: Home              Anticipated d/c date is: 24-48h              Patient currently NOT medically stable for discharge  Consultants:  Pulmonology, IR  Procedures:  R thoracentesis  Antimicrobials:  None   Subjective: No acute issues/events overnight  Objective: Vitals:   05/08/22 1856 05/08/22 1856 05/08/22 2216 05/09/22 0400  BP: 124/74  122/82 132/85  Pulse: 95  84 80  Resp: (!) 23  18 16   Temp:  97.9 F (36.6 C) 98.4 F (36.9 C) 98.9 F (37.2 C)  TempSrc:  Oral Oral   SpO2: 99%  100% 100%  Weight:   51.9 kg   Height:   5\' 5"  (1.651 m)    No intake or output data in the 24 hours ending 05/09/22 0727 Filed Weights   05/08/22 0756 05/08/22  2216  Weight: 51.3 kg 51.9 kg    Examination:  General exam: Appears calm and comfortable  Respiratory system: Diminshed - scant R sided rales Cardiovascular system: S1 & S2 heard, RRR. No JVD, murmurs, rubs, gallops or clicks. No pedal edema. Gastrointestinal system: Abdomen is nondistended, soft and nontender. No organomegaly or masses felt. Normal bowel sounds heard. Central nervous system: Alert and oriented. No focal  neurological deficits. Extremities: Symmetric 5 x 5 power. Skin: No rashes, lesions or ulcers Psychiatry: Judgement and insight appear normal. Mood & affect appropriate.   Data Reviewed: I have personally reviewed following labs and imaging studies  CBC: Recent Labs  Lab 05/08/22 0837 05/09/22 0441  WBC 6.5 3.1*  HGB 9.3* 8.3*  HCT 28.4* 23.9*  MCV 96.9 93.4  PLT 226 938   Basic Metabolic Panel: Recent Labs  Lab 05/08/22 0837 05/09/22 0441  NA 137 136  K 3.6 3.5  CL 98 97*  CO2 26 29  GLUCOSE 90 125*  BUN 24* 30*  CREATININE 1.53* 1.74*  CALCIUM 8.7* 8.6*   GFR: Estimated Creatinine Clearance: 25.4 mL/min (A) (by C-G formula based on SCr of 1.74 mg/dL (H)). Liver Function Tests: Recent Labs  Lab 05/08/22 0837  AST 36  ALT 18  ALKPHOS 63  BILITOT 1.1  PROT 7.3  ALBUMIN 3.7   No results for input(s): "LIPASE", "AMYLASE" in the last 168 hours. No results for input(s): "AMMONIA" in the last 168 hours. Coagulation Profile: Recent Labs  Lab 05/08/22 0837  INR 1.2   Cardiac Enzymes: No results for input(s): "CKTOTAL", "CKMB", "CKMBINDEX", "TROPONINI" in the last 168 hours. BNP (last 3 results) No results for input(s): "PROBNP" in the last 8760 hours. HbA1C: No results for input(s): "HGBA1C" in the last 72 hours. CBG: Recent Labs  Lab 05/08/22 0915  GLUCAP 87   Lipid Profile: No results for input(s): "CHOL", "HDL", "LDLCALC", "TRIG", "CHOLHDL", "LDLDIRECT" in the last 72 hours. Thyroid Function Tests: No results for input(s): "TSH", "T4TOTAL", "FREET4", "T3FREE", "THYROIDAB" in the last 72 hours. Anemia Panel: No results for input(s): "VITAMINB12", "FOLATE", "FERRITIN", "TIBC", "IRON", "RETICCTPCT" in the last 72 hours. Sepsis Labs: No results for input(s): "PROCALCITON", "LATICACIDVEN" in the last 168 hours.  Recent Results (from the past 240 hour(s))  SARS Coronavirus 2 by RT PCR (hospital order, performed in [facility] hospital lab) *cepheid  single result test* Anterior Nasal Swab     Status: None   Collection Time: 05/08/22  8:37 AM   Specimen: Anterior Nasal Swab  Result Value Ref Range Status   SARS Coronavirus 2 by RT PCR NEGATIVE NEGATIVE Final    Comment: (NOTE) SARS-CoV-2 target nucleic acids are NOT DETECTED.  The SARS-CoV-2 RNA is generally detectable in upper and lower respiratory specimens during the acute phase of infection. The lowest concentration of SARS-CoV-2 viral copies this assay can detect is 250 copies / mL. A negative result does not preclude SARS-CoV-2 infection and should not be used as the sole basis for treatment or other patient management decisions.  A negative result may occur with improper specimen collection / handling, submission of specimen other than nasopharyngeal swab, presence of viral mutation(s) within the areas targeted by this assay, and inadequate number of viral copies (<250 copies / mL). A negative result must be combined with clinical observations, patient history, and epidemiological information.  Fact Sheet for Patients:   https://www.patel.info/  Fact Sheet for Healthcare Providers: https://hall.com/  This test is not yet approved or  cleared by the  [place] [employer] and has been authorized for detection and/or diagnosis of SARS-CoV-2 by FDA under an Print production planner (EUA).  This EUA will remain in effect (meaning this test can be used) for the duration of the COVID-19 declaration under Section 564(b)(1) of the Act, 21 U.S.C. section 360bbb-3(b)(1), unless the authorization is terminated or revoked sooner.  Performed at [facility] [address] [place], [place] [address]   Body fluid culture w Gram Stain     Status: None (Preliminary result)   Collection Time: 05/08/22 12:08 PM   Specimen: PATH Cytology Pleural fluid  Result Value Ref Range Status   Specimen Description   Final     PLEURAL Performed at [facility], [address], [place], [place] [address]    Special Requests   Final    NONE Performed at [facility], [address] [place], [place] [address]    Gram Stain   Final    RARE WBC PRESENT, PREDOMINANTLY MONONUCLEAR NO ORGANISMS SEEN Performed at [facility], [place] [address], [place], [place] [address]    Culture PENDING  Incomplete   Report Status PENDING  Incomplete         Radiology Studies: DG Chest Port 1 View  Result Date: 05/08/2022 CLINICAL DATA:  Status post thoracentesis. EXAM: PORTABLE CHEST 1 VIEW COMPARISON:  Chest x-ray, same date. FINDINGS: Interval evacuation of the right pleural fluid collection. No postprocedural pneumothorax. IMPRESSION: Status post right thoracentesis. No residual pleural fluid and no pneumothorax. Electronically Signed   By: Marijo Sanes M.D.   On: 05/08/2022 12:18   US THORACENTESIS ASP PLEURAL SPACE W/IMG GUIDE  Result Date: 05/08/2022 INDICATION: Patient with recurrent right pleural effusion, request received for diagnostic and therapeutic thoracentesis. Patient with known atrial fibrillation, chronic kidney disease, history of adenocarcinoma of the right lung status post radiation and congestive heart failure. EXAM: ULTRASOUND GUIDED RIGHT THORACENTESIS MEDICATIONS: Local 1% lidocaine only. COMPLICATIONS: None immediate. PROCEDURE: An ultrasound guided thoracentesis was thoroughly discussed with the patient and questions answered. The benefits, risks, alternatives and complications were also discussed. The patient understands and wishes to proceed with the procedure. Written consent was obtained. Ultrasound was performed to localize and mark an adequate pocket of fluid in the right chest. The area was then prepped and draped in the normal sterile fashion. 1% Lidocaine was used for local anesthesia. Under ultrasound guidance a 19 gauge, 7-cm, Yueh catheter was introduced. Thoracentesis  was performed. The catheter was removed and a dressing applied. FINDINGS: A total of approximately 1.2 L of clear yellow fluid was removed. Samples were sent to the laboratory as requested by the clinical team. IMPRESSION: Successful ultrasound guided right thoracentesis yielding 1.2 L of pleural fluid. This exam was performed by Tsosie Billing PA-C, and was supervised and interpreted by Dr. Kathlene Cote. Electronically Signed   By: Aletta Edouard M.D.   On: 05/08/2022 12:10   CT ABDOMEN PELVIS WO CONTRAST  Result Date: 05/08/2022 CLINICAL DATA:  Acute non localized abdominal pain for several days. Anorexia. EXAM: CT ABDOMEN AND PELVIS WITHOUT CONTRAST TECHNIQUE: Multidetector CT imaging of the abdomen and pelvis was performed following the standard protocol without IV contrast. RADIATION DOSE REDUCTION: This exam was performed according to the departmental dose-optimization program which includes automated exposure control, adjustment of the mA and/or kV according to patient size and/or use of iterative reconstruction technique. COMPARISON:  04/13/2022 FINDINGS: Lower chest: Moderate right pleural effusion, without significant change. Hepatobiliary: Small hepatic cysts remains  stable. No new or enlarging liver lesions seen on this unenhanced exam. High attenuation gallbladder sludge again noted, however there is no evidence of cholecystitis or biliary ductal dilatation. Pancreas: No mass or inflammatory process visualized on this unenhanced exam. Spleen:  Within normal limits in size. Adrenals/Urinary tract: No evidence of urolithiasis or hydronephrosis. Unremarkable unopacified urinary bladder. Stomach/Bowel: No evidence of obstruction, inflammatory process, or abnormal fluid collections. Diverticulosis is seen mainly involving the sigmoid colon, however there is no evidence of diverticulitis. Vascular/Lymphatic: No pathologically enlarged lymph nodes identified. No evidence of abdominal aortic aneurysm. Aortic  atherosclerotic calcification incidentally noted. Reproductive: No mass or other significant abnormality. Bilateral tubal ligation clips are noted. Other:  None. Musculoskeletal:  No suspicious bone lesions identified. IMPRESSION: No evidence of urolithiasis, hydronephrosis, or other acute findings. Colonic diverticulosis, without radiographic evidence of diverticulitis. Stable moderate right pleural effusion. Electronically Signed   By: Marlaine Hind M.D.   On: 05/08/2022 09:12   CT HEAD WO CONTRAST (5MM)  Result Date: 05/08/2022 CLINICAL DATA:  Dizziness, persistent/recurrent, cardiac or vascular cause suspected EXAM: CT HEAD WITHOUT CONTRAST TECHNIQUE: Contiguous axial images were obtained from the base of the skull through the vertex without intravenous contrast. RADIATION DOSE REDUCTION: This exam was performed according to the departmental dose-optimization program which includes automated exposure control, adjustment of the mA and/or kV according to patient size and/or use of iterative reconstruction technique. COMPARISON:  01/29/2022 FINDINGS: Brain: No evidence of acute infarction, hemorrhage, hydrocephalus, extra-axial collection or mass lesion/mass effect. Scattered low-density changes within the periventricular and subcortical white matter compatible with chronic microvascular ischemic change. Mild diffuse cerebral volume loss. Vascular: Atherosclerotic calcifications involving the large vessels of the skull base. No unexpected hyperdense vessel. Skull: Normal. Negative for fracture or focal lesion. Sinuses/Orbits: No acute finding. Other: None. IMPRESSION: 1. No acute intracranial abnormality. 2. Mild chronic microvascular ischemic change and cerebral volume loss. Electronically Signed   By: Davina Poke D.O.   On: 05/08/2022 09:10   DG Chest Portable 1 View  Result Date: 05/08/2022 CLINICAL DATA:  Shortness of breath EXAM: PORTABLE CHEST 1 VIEW COMPARISON:  Chest x-ray dated April 30, 2021  FINDINGS: Cardiac and mediastinal contours are unchanged. Background emphysema. Stable right perihilar and right upper lobe nodular opacities. No new parenchymal opacity. Stable small right pleural effusion. No evidence of pneumothorax. IMPRESSION: Stable small right pleural effusion and right lung nodular opacities. Electronically Signed   By: Yetta Glassman M.D.   On: 05/08/2022 08:34        Scheduled Meds:  amiodarone  200 mg Oral BID   apixaban  2.5 mg Oral BID   atorvastatin  40 mg Oral QPM   citalopram  20 mg Oral Daily   ferrous sulfate  325 mg Oral Q breakfast   fluticasone furoate-vilanterol  1 puff Inhalation Daily   And   umeclidinium bromide  1 puff Inhalation Daily   metoprolol tartrate  12.5 mg Oral BID   pantoprazole  40 mg Oral BID AC   sodium bicarbonate  1,300 mg Oral Daily   sodium chloride flush  3 mL Intravenous Q12H   torsemide  40 mg Oral Daily   Continuous Infusions:  sodium chloride       LOS: 1 day   Time spent: 83min  Jeane Cashatt C Darrick Greenlaw, DO [facility]  If 7PM-7AM, please contact night-coverage [email or web address]  05/09/2022, 7:27 AM

## 2022-05-10 ENCOUNTER — Encounter: Payer: Self-pay | Admitting: Internal Medicine

## 2022-05-10 DIAGNOSIS — J90 Pleural effusion, not elsewhere classified: Secondary | ICD-10-CM | POA: Diagnosis not present

## 2022-05-10 LAB — CBC
HCT: 24.5 % — ABNORMAL LOW (ref 36.0–46.0)
Hemoglobin: 8.3 g/dL — ABNORMAL LOW (ref 12.0–15.0)
MCH: 32 pg (ref 26.0–34.0)
MCHC: 33.9 g/dL (ref 30.0–36.0)
MCV: 94.6 fL (ref 80.0–100.0)
Platelets: 220 10*3/uL (ref 150–400)
RBC: 2.59 MIL/uL — ABNORMAL LOW (ref 3.87–5.11)
RDW: 14.6 % (ref 11.5–15.5)
WBC: 6.3 10*3/uL (ref 4.0–10.5)
nRBC: 0 % (ref 0.0–0.2)

## 2022-05-10 LAB — HEMOGLOBIN AND HEMATOCRIT, BLOOD
HCT: 25.4 % — ABNORMAL LOW (ref 36.0–46.0)
Hemoglobin: 8.3 g/dL — ABNORMAL LOW (ref 12.0–15.0)

## 2022-05-10 LAB — BASIC METABOLIC PANEL
Anion gap: 8 (ref 5–15)
BUN: 37 mg/dL — ABNORMAL HIGH (ref 8–23)
CO2: 31 mmol/L (ref 22–32)
Calcium: 8.5 mg/dL — ABNORMAL LOW (ref 8.9–10.3)
Chloride: 100 mmol/L (ref 98–111)
Creatinine, Ser: 1.93 mg/dL — ABNORMAL HIGH (ref 0.44–1.00)
GFR, Estimated: 28 mL/min — ABNORMAL LOW (ref >60)
Glucose, Bld: 89 mg/dL (ref 70–99)
Potassium: 3 mmol/L — ABNORMAL LOW (ref 3.5–5.1)
Sodium: 139 mmol/L (ref 135–145)

## 2022-05-10 LAB — CYTOLOGY - NON PAP

## 2022-05-10 MED ORDER — MUPIROCIN 2 % EX OINT
TOPICAL_OINTMENT | Freq: Two times a day (BID) | CUTANEOUS | Status: DC
  Administered 2022-05-11: 1 via NASAL
  Filled 2022-05-10: qty 22

## 2022-05-10 MED ORDER — OXYMETAZOLINE HCL 0.05 % NA SOLN
1.0000 | Freq: Two times a day (BID) | NASAL | Status: DC | PRN
  Administered 2022-05-10 – 2022-05-11 (×2): 1 via NASAL
  Filled 2022-05-10: qty 15

## 2022-05-10 NOTE — Progress Notes (Signed)
No further bleeding after cauterization.  Still concern for repeat bleeding given nasal canula and anti-coagulation.   Recommend humidification, Afrin BID x 72 hours, and Bactroban ointment to right nares.  Hold on packing at this time.

## 2022-05-10 NOTE — Progress Notes (Signed)
Pt called at 0655 that she was having nose bleed, noted pt was holding a towel over  her nose. Bright red blood noted coming from the right nostril. Pressure applied and Afrin spray was given. Pt provider notified by the day nurse. Bleeding stopped after 10 to 15 minutes of pressure. Day nurse was to follow with MD.

## 2022-05-10 NOTE — Progress Notes (Signed)
2330 Called to the pt room that she was having nose bleed. Noted moderate amount of bright red blood on pt gown and bed sheet. Upon assessment, this nurse noted the bleeding was from the right nostril. Pressure applied for 5 minutes, bleeding stopped. VS taken and stable. On call provider notified.

## 2022-05-10 NOTE — Progress Notes (Addendum)
PROGRESS NOTE    Danielle [name]  RSW:[id number] DOB: 1956/03/02 DOA: 05/08/2022 PCP: [name], PA   Brief Narrative:  Danielle [name] is a 66 y.o. female with medical history significant for A-fib on chronic anticoagulation therapy, history of chronic systolic heart failure, history of adenocarcinoma of the right lung status post radiation therapy, history of stage IIIb chronic kidney disease, aortic valve stenosis, COPD with chronic respiratory failure on 4 L of oxygen continuous, history of left breast cancer status post mastectomy, history of esophageal cancer who presents to the ER for evaluation of worsening shortness of breath from her baseline with recurrent moderate right pleural effusion  Assessment & Plan:   Principal Problem:   Recurrent right pleural effusion Active Problems:   Paroxysmal atrial fibrillation (HCC)   Chronic systolic heart failure (HCC)   Malignant neoplasm of upper lobe of right lung (HCC)   Malignant neoplasm of lower third of esophagus (HCC)   GERD (gastroesophageal reflux disease)   Depression   CKD (chronic kidney disease), stage IV (HCC)   COPD, severe (HCC)   Profound epistaxis, acute -Unclear etiology likely exacerbated by high flow oxygen and anticoagulation -ENT consulted, appreciate insight recommendations, bedside cautery this morning for anterior lesion concern for posterior lesion as well -Hold Eliquis for 24 hours, may need more prolonged pause or possibly complete cessation of anticoagulation given risk factors as below and worsening anemia  Recurrent right pleural effusion Transudative by appearance - > likely heart failure vs less likely protein/nephrotic in nature Pulm following - appreciate insight/recs   Paroxysmal atrial fibrillation (HCC) Continue amiodarone for rate control as well as low-dose beta-blockers Continue apixaban as primary prophylaxis for an acute stroke   Chronic systolic heart failure (HCC) Stable and not  acutely exacerbated Concern above effusion related to poor diet/lifestyle regimen Last known LVEF of 35 to 40% with moderate aortic stenosis Continue torsemide and metoprolol  COPD, severe ([place]), not in acute exacerbation Patient has a history of severe COPD with chronic respiratory failure on 4 L of oxygen continuous Place patient on as needed and scheduled bronchodilator therapy Continue inhaled steroids Continue ambulatory oxygen screening  CKD (chronic kidney disease), stage IV (HCC) Stable  Monitor renal function closely during this hospitalization  Chronic anemia of chronic disease  -Acutely exacerbated by epistaxis -Likely secondary to CKD/history of malignancy  Depression Stable Continue Celexa  GERD (gastroesophageal reflux disease) Stable  Continue PPI  Malignant neoplasm of lower third of esophagus (HCC) Patient has a history of esophageal cancer and is status post radiation therapy Follow up with oncology as an outpatient   Malignant neoplasm of upper lobe of right lung ([place]) Patient has a history of adenocarcinoma of the right lung and is status post radiation therapy Follow-up with oncology as an outpatient  DVT prophylaxis: **Eliquis currently being held given above** Code Status: Full Family Communication: None present  Status is: Inpt  Dispo: The patient is from: Home              Anticipated d/c is to: Home              Anticipated d/c date is: 24-48h              Patient currently NOT medically stable for discharge  Consultants:  Pulmonology, IR  Procedures:  R thoracentesis  Antimicrobials:  None   Subjective: No acute issues/events overnight  Objective: Vitals:   05/09/22 1614 05/09/22 1941 05/09/22 2335 05/10/22 0331  BP: 121/61 123/72 121/78  115/74  Pulse: 87 86 73 81  Resp: 18 16 20 18   Temp: 98.1 F (36.7 C) 98.3 F (36.8 C) 98.1 F (36.7 C) 98.1 F (36.7 C)  TempSrc: Oral Oral Oral Oral  SpO2: 95% 99% 100% 93%  Weight:       Height:        Intake/Output Summary (Last 24 hours) at 05/10/2022 0750 Last data filed at 05/10/2022 0022 Gross per 24 hour  Intake 840 ml  Output 300 ml  Net 540 ml   Filed Weights   05/08/22 0756 05/08/22 2216  Weight: 51.3 kg 51.9 kg    Examination:  General exam: Appears calm and comfortable  Respiratory system: Diminshed - scant R sided rales Cardiovascular system: S1 & S2 heard, RRR. No JVD, murmurs, rubs, gallops or clicks. No pedal edema. Gastrointestinal system: Abdomen is nondistended, soft and nontender. No organomegaly or masses felt. Normal bowel sounds heard. Central nervous system: Alert and oriented. No focal neurological deficits. Extremities: Symmetric 5 x 5 power. Skin: No rashes, lesions or ulcers Psychiatry: Judgement and insight appear normal. Mood & affect appropriate.   Data Reviewed: I have personally reviewed following labs and imaging studies  CBC: Recent Labs  Lab 05/08/22 0837 05/09/22 0441 05/10/22 0427  WBC 6.5 3.1* 6.3  HGB 9.3* 8.3* 8.3*  HCT 28.4* 23.9* 24.5*  MCV 96.9 93.4 94.6  PLT 226 216 476    Basic Metabolic Panel: Recent Labs  Lab 05/08/22 0837 05/09/22 0441 05/10/22 0427  NA 137 136 139  K 3.6 3.5 3.0*  CL 98 97* 100  CO2 26 29 31   GLUCOSE 90 125* 89  BUN 24* 30* 37*  CREATININE 1.53* 1.74* 1.93*  CALCIUM 8.7* 8.6* 8.5*    GFR: Estimated Creatinine Clearance: 22.9 mL/min (A) (by C-G formula based on SCr of 1.93 mg/dL (H)). Liver Function Tests: Recent Labs  Lab 05/08/22 0837  AST 36  ALT 18  ALKPHOS 63  BILITOT 1.1  PROT 7.3  ALBUMIN 3.7    No results for input(s): "LIPASE", "AMYLASE" in the last 168 hours. No results for input(s): "AMMONIA" in the last 168 hours. Coagulation Profile: Recent Labs  Lab 05/08/22 0837  INR 1.2    Cardiac Enzymes: No results for input(s): "CKTOTAL", "CKMB", "CKMBINDEX", "TROPONINI" in the last 168 hours. BNP (last 3 results) No results for input(s): "PROBNP" in  the last 8760 hours. HbA1C: No results for input(s): "HGBA1C" in the last 72 hours. CBG: Recent Labs  Lab 05/08/22 0915  GLUCAP 87    Lipid Profile: No results for input(s): "CHOL", "HDL", "LDLCALC", "TRIG", "CHOLHDL", "LDLDIRECT" in the last 72 hours. Thyroid Function Tests: No results for input(s): "TSH", "T4TOTAL", "FREET4", "T3FREE", "THYROIDAB" in the last 72 hours. Anemia Panel: No results for input(s): "VITAMINB12", "FOLATE", "FERRITIN", "TIBC", "IRON", "RETICCTPCT" in the last 72 hours. Sepsis Labs: No results for input(s): "PROCALCITON", "LATICACIDVEN" in the last 168 hours.  Recent Results (from the past 240 hour(s))  SARS Coronavirus 2 by RT PCR (hospital order, performed in [facility] hospital lab) *cepheid single result test* Anterior Nasal Swab     Status: None   Collection Time: 05/08/22  8:37 AM   Specimen: Anterior Nasal Swab  Result Value Ref Range Status   SARS Coronavirus 2 by RT PCR NEGATIVE NEGATIVE Final    Comment: (NOTE) SARS-CoV-2 target nucleic acids are NOT DETECTED.  The SARS-CoV-2 RNA is generally detectable in upper and lower respiratory specimens during the acute phase of infection. The lowest  concentration of SARS-CoV-2 viral copies this assay can detect is 250 copies / mL. A negative result does not preclude SARS-CoV-2 infection and should not be used as the sole basis for treatment or other patient management decisions.  A negative result may occur with improper specimen collection / handling, submission of specimen other than nasopharyngeal swab, presence of viral mutation(s) within the areas targeted by this assay, and inadequate number of viral copies (<250 copies / mL). A negative result must be combined with clinical observations, patient history, and epidemiological information.  Fact Sheet for Patients:   https://www.patel.info/  Fact Sheet for Healthcare  Providers: https://hall.com/  This test is not yet approved or  cleared by the [place] FDA and has been authorized for detection and/or diagnosis of SARS-CoV-2 by FDA under an Emergency Use Authorization (EUA).  This EUA will remain in effect (meaning this test can be used) for the duration of the COVID-19 declaration under Section 564(b)(1) of the Act, 21 U.S.C. section 360bbb-3(b)(1), unless the authorization is terminated or revoked sooner.  Performed at [facility], [address] [place], [place] [address]   Body fluid culture w Gram Stain     Status: None (Preliminary result)   Collection Time: 05/08/22 12:08 PM   Specimen: PATH Cytology Pleural fluid  Result Value Ref Range Status   Specimen Description   Final    PLEURAL Performed at [facility], [address], [place], [place] [address]    Special Requests   Final    NONE Performed at [facility], [address] [place], [place] [address]    Gram Stain   Final    RARE WBC PRESENT, PREDOMINANTLY MONONUCLEAR NO ORGANISMS SEEN    Culture   Final    NO GROWTH < 24 HOURS Performed at [facility], [place] [address], [place], [place] [address]    Report Status PENDING  Incomplete         Radiology Studies: DG Chest Port 1 View  Result Date: 05/08/2022 CLINICAL DATA:  Status post thoracentesis. EXAM: PORTABLE CHEST 1 VIEW COMPARISON:  Chest x-ray, same date. FINDINGS: Interval evacuation of the right pleural fluid collection. No postprocedural pneumothorax. IMPRESSION: Status post right thoracentesis. No residual pleural fluid and no pneumothorax. Electronically Signed   By: Marijo Sanes M.D.   On: 05/08/2022 12:18   US THORACENTESIS ASP PLEURAL SPACE W/IMG GUIDE  Result Date: 05/08/2022 INDICATION: Patient with recurrent right pleural effusion, request received for diagnostic and therapeutic thoracentesis. Patient with known atrial fibrillation,  chronic kidney disease, history of adenocarcinoma of the right lung status post radiation and congestive heart failure. EXAM: ULTRASOUND GUIDED RIGHT THORACENTESIS MEDICATIONS: Local 1% lidocaine only. COMPLICATIONS: None immediate. PROCEDURE: An ultrasound guided thoracentesis was thoroughly discussed with the patient and questions answered. The benefits, risks, alternatives and complications were also discussed. The patient understands and wishes to proceed with the procedure. Written consent was obtained. Ultrasound was performed to localize and mark an adequate pocket of fluid in the right chest. The area was then prepped and draped in the normal sterile fashion. 1% Lidocaine was used for local anesthesia. Under ultrasound guidance a 19 gauge, 7-cm, Yueh catheter was introduced. Thoracentesis was performed. The catheter was removed and a dressing applied. FINDINGS: A total of approximately 1.2 L of clear yellow fluid was removed. Samples were sent to the laboratory as requested by the clinical team. IMPRESSION: Successful ultrasound guided right thoracentesis yielding 1.2 L of pleural fluid. This exam  was performed by Tsosie Billing PA-C, and was supervised and interpreted by Dr. Kathlene Cote. Electronically Signed   By: Aletta Edouard M.D.   On: 05/08/2022 12:10   CT ABDOMEN PELVIS WO CONTRAST  Result Date: 05/08/2022 CLINICAL DATA:  Acute non localized abdominal pain for several days. Anorexia. EXAM: CT ABDOMEN AND PELVIS WITHOUT CONTRAST TECHNIQUE: Multidetector CT imaging of the abdomen and pelvis was performed following the standard protocol without IV contrast. RADIATION DOSE REDUCTION: This exam was performed according to the departmental dose-optimization program which includes automated exposure control, adjustment of the mA and/or kV according to patient size and/or use of iterative reconstruction technique. COMPARISON:  04/13/2022 FINDINGS: Lower chest: Moderate right pleural effusion, without  significant change. Hepatobiliary: Small hepatic cysts remains stable. No new or enlarging liver lesions seen on this unenhanced exam. High attenuation gallbladder sludge again noted, however there is no evidence of cholecystitis or biliary ductal dilatation. Pancreas: No mass or inflammatory process visualized on this unenhanced exam. Spleen:  Within normal limits in size. Adrenals/Urinary tract: No evidence of urolithiasis or hydronephrosis. Unremarkable unopacified urinary bladder. Stomach/Bowel: No evidence of obstruction, inflammatory process, or abnormal fluid collections. Diverticulosis is seen mainly involving the sigmoid colon, however there is no evidence of diverticulitis. Vascular/Lymphatic: No pathologically enlarged lymph nodes identified. No evidence of abdominal aortic aneurysm. Aortic atherosclerotic calcification incidentally noted. Reproductive: No mass or other significant abnormality. Bilateral tubal ligation clips are noted. Other:  None. Musculoskeletal:  No suspicious bone lesions identified. IMPRESSION: No evidence of urolithiasis, hydronephrosis, or other acute findings. Colonic diverticulosis, without radiographic evidence of diverticulitis. Stable moderate right pleural effusion. Electronically Signed   By: Marlaine Hind M.D.   On: 05/08/2022 09:12   CT HEAD WO CONTRAST (5MM)  Result Date: 05/08/2022 CLINICAL DATA:  Dizziness, persistent/recurrent, cardiac or vascular cause suspected EXAM: CT HEAD WITHOUT CONTRAST TECHNIQUE: Contiguous axial images were obtained from the base of the skull through the vertex without intravenous contrast. RADIATION DOSE REDUCTION: This exam was performed according to the departmental dose-optimization program which includes automated exposure control, adjustment of the mA and/or kV according to patient size and/or use of iterative reconstruction technique. COMPARISON:  01/29/2022 FINDINGS: Brain: No evidence of acute infarction, hemorrhage,  hydrocephalus, extra-axial collection or mass lesion/mass effect. Scattered low-density changes within the periventricular and subcortical white matter compatible with chronic microvascular ischemic change. Mild diffuse cerebral volume loss. Vascular: Atherosclerotic calcifications involving the large vessels of the skull base. No unexpected hyperdense vessel. Skull: Normal. Negative for fracture or focal lesion. Sinuses/Orbits: No acute finding. Other: None. IMPRESSION: 1. No acute intracranial abnormality. 2. Mild chronic microvascular ischemic change and cerebral volume loss. Electronically Signed   By: Davina Poke D.O.   On: 05/08/2022 09:10   DG Chest Portable 1 View  Result Date: 05/08/2022 CLINICAL DATA:  Shortness of breath EXAM: PORTABLE CHEST 1 VIEW COMPARISON:  Chest x-ray dated April 30, 2021 FINDINGS: Cardiac and mediastinal contours are unchanged. Background emphysema. Stable right perihilar and right upper lobe nodular opacities. No new parenchymal opacity. Stable small right pleural effusion. No evidence of pneumothorax. IMPRESSION: Stable small right pleural effusion and right lung nodular opacities. Electronically Signed   By: Yetta Glassman M.D.   On: 05/08/2022 08:34        Scheduled Meds:  amiodarone  200 mg Oral BID   apixaban  2.5 mg Oral BID   atorvastatin  40 mg Oral QPM   citalopram  20 mg Oral Daily   ferrous sulfate  325  mg Oral Q breakfast   fluticasone furoate-vilanterol  1 puff Inhalation Daily   And   umeclidinium bromide  1 puff Inhalation Daily   metoprolol tartrate  12.5 mg Oral BID   pantoprazole  40 mg Oral Daily   sodium bicarbonate  1,300 mg Oral Daily   sodium chloride flush  3 mL Intravenous Q12H   torsemide  40 mg Oral Daily   Continuous Infusions:  sodium chloride       LOS: 2 days   Time spent: 61min  Sanel Stemmer C Marci Polito, DO [facility]  If 7PM-7AM, please contact night-coverage [email or web address]  05/10/2022, 7:50 AM

## 2022-05-10 NOTE — Progress Notes (Signed)
       CROSS COVER NOTE  NAME: Danielle [name] MRN: [id number] DOB : 03-11-1956    Date of Service   05/10/22  HPI/Events of Note   Secure chat received from nursing "Pt had an episode of nose bleed from her right nose. moderate amount of bright red blood noted on pt gown and bed. Pressure applied to the right nostril. bleeding stop a the moment. VS taken and stable"  Interventions   Plan: PRN Afrin         This document was prepared using Dragon voice recognition software and may include unintentional dictation errors.  Neomia Glass DNP, MHA, FNP-BC Nurse Practitioner [facility] Pager [phone]

## 2022-05-10 NOTE — Consult Note (Addendum)
Kimberly, Nieland [id number] Aug 14, 1956 Little Ishikawa, MD  Reason for Consult: epistaxis  HPI: 66 year old female with history of right pleural effusion, Afib, oxygen dependence, and anticoagulation who developed a nose bleed overnight.  Per notes and patient, began bleeding last night and again this morning.  She reports some intermittent nose bleeds at home but that this is different.  Her right side seems to be the culprit.  Denies any trauma.  Denies any headache.  Stable breathing with nasal canula oxygen.  Afrin was administered and pressure but it has continued to bleed intermittently.  Allergies: No Known Allergies  ROS: Review of systems normal other than 12 systems except per HPI.  PMH:  Past Medical History:  Diagnosis Date   Anemia    Anxiety    Aortic atherosclerosis ([place])    Aortic valve stenosis 02/10/2018   a.) TTE 02/10/2018: EF 55-60%: mild AS with MPG of 12 mmHg. b.) TTE 04/21/2018: EF 35-40%; mild AS with MPG 8 mmHg. c.) TTE 11/07/2019: EF 50-55%; mild AS with MPG 10 mmHg.   Arthritis    Atrial fibrillation (HCC)    a.) CHA2DS2-VASc = 4 (age, sex, HTN, aortic plaque). b.) Rate/rhythm maintained on oral carvedilol; chronically anticoagulated on dose reduced apixaban. c.)  Attempted deployment of LAA occlusive device on 01/11/2021; parameters failed and procedure aborted.   Breast cancer, left ([place]) 2000   a.) T2N1M0; ER/PR (+) --> Tx'd with total mastectomy, LN resection, XRT, and chemotherapy   Cancer of right lung ([place]) 07/30/2016   a.) adenocarcinoma; ALK, ROS1, PDL1, BRAF, EGFR all negative.   Chronic diastolic CHF (congestive heart failure) ([place]) 11/26/2021   CKD (chronic kidney disease), stage IV (HCC)    COPD (chronic obstructive pulmonary disease) (HCC)    Dependence on supplemental oxygen    Depression    Diastolic dysfunction 16/94/5038   a.) TTE 02/10/2017: EF 55-60%; G2DD. b.) TTE 04/21/2018: EF 35-40%; mild LA dilation; mod MV regurgitation. c.)  TTE 11/07/2019: EF 50-55%; G1DD.   DOE (dyspnea on exertion)    GERD (gastroesophageal reflux disease)    Heart murmur    History of 2019 novel coronavirus disease (COVID-19) 10/14/2019   HLD (hyperlipidemia)    Hypertension    Long term current use of anticoagulant    a.) apixaban   Lymphedema    Personal history of chemotherapy    Personal history of radiation therapy    SBO (small bowel obstruction) ([place]) 11/05/2020   Vitamin D deficiency     FH:  Family History  Problem Relation Age of Onset   Breast cancer Mother 31   Cancer Mother        Breast    Cirrhosis Father    Breast cancer Paternal Aunt 44   Cancer Maternal Aunt        Breast     SH:  Social History   Socioeconomic History   Marital status: Divorced    Spouse name: Not on file   Number of children: 3   Years of education: Not on file   Highest education level: Not on file  Occupational History   Occupation: Retired   Tobacco Use   Smoking status: Former    Packs/day: 0.50    Years: 20.00    Total pack years: 10.00    Types: Cigarettes    Quit date: 07/02/2012    Years since quitting: 9.8   Smokeless tobacco: Current    Types: Snuff  Vaping Use   Vaping Use:  Never used  Substance and Sexual Activity   Alcohol use: Yes    Alcohol/week: 3.0 standard drinks of alcohol    Types: 3 Cans of beer per week    Comment: Occasionally beer   Drug use: No   Sexual activity: Not Currently  Other Topics Concern   Not on file  Social History Narrative   Not on file   Social Determinants of Health   Financial Resource Strain: High Risk (07/05/2018)   Overall Financial Resource Strain (CARDIA)    Difficulty of Paying Living Expenses: Hard  Food Insecurity: Food Insecurity Present (07/05/2018)   Hunger Vital Sign    Worried About Running Out of Food in the Last Year: Often true    Ran Out of Food in the Last Year: Often true  Transportation Needs: No Transportation Needs (02/28/2022)   PRAPARE -  Hydrologist (Medical): No    Lack of Transportation (Non-Medical): No  Physical Activity: Insufficiently Active (07/05/2018)   Exercise Vital Sign    Days of Exercise per Week: 2 days    Minutes of Exercise per Session: 30 min  Stress: No Stress Concern Present (07/05/2018)   [place]    Feeling of Stress : Not at all  Social Connections: Somewhat Isolated (07/05/2018)   Social Connection and Isolation Panel [NHANES]    Frequency of Communication with Friends and Family: More than three times a week    Frequency of Social Gatherings with Friends and Family: More than three times a week    Attends Religious Services: 1 to 4 times per year    Active Member of [employer] or Organizations: No    Attends Archivist Meetings: Never    Marital Status: Divorced  Human resources officer Violence: Not At Risk (07/05/2018)   Humiliation, Afraid, Rape, and Kick questionnaire    Fear of Current or Ex-Partner: No    Emotionally Abused: No    Physically Abused: No    Sexually Abused: No    PSH:  Past Surgical History:  Procedure Laterality Date   Breast Biospy Left    [facility]   BREAST SURGERY     CARDIOVERSION N/A 02/23/2022   Procedure: CARDIOVERSION;  Surgeon: Corey Skains, MD;  Location: [facility] ORS;  Service: Cardiovascular;  Laterality: N/A;   COLONOSCOPY N/A 04/30/2018   Procedure: COLONOSCOPY;  Surgeon: Virgel Manifold, MD;  Location: [facility] ENDOSCOPY;  Service: Endoscopy;  Laterality: N/A;   COLONOSCOPY N/A 07/22/2018   Procedure: COLONOSCOPY;  Surgeon: Virgel Manifold, MD;  Location: [facility] ENDOSCOPY;  Service: Endoscopy;  Laterality: N/A;   COLONOSCOPY WITH PROPOFOL N/A 09/21/2021   Procedure: COLONOSCOPY WITH PROPOFOL;  Surgeon: Benjamine Sprague, DO;  Location: [facility] ENDOSCOPY;  Service: General;  Laterality: N/A;   COLONOSCOPY WITH PROPOFOL N/A 03/11/2022   Procedure: COLONOSCOPY WITH  PROPOFOL;  Surgeon: Annamaria Helling, DO;  Location: [facility] ENDOSCOPY;  Service: Gastroenterology;  Laterality: N/A;   DILATION AND CURETTAGE OF UTERUS     ELECTROMAGNETIC NAVIGATION BROCHOSCOPY Right 04/11/2016   Procedure: ELECTROMAGNETIC NAVIGATION BRONCHOSCOPY;  Surgeon: Vilinda Boehringer, MD;  Location: [facility] ORS;  Service: Cardiopulmonary;  Laterality: Right;   ESOPHAGOGASTRODUODENOSCOPY N/A 07/22/2018   Procedure: ESOPHAGOGASTRODUODENOSCOPY (EGD);  Surgeon: Virgel Manifold, MD;  Location: [facility] ENDOSCOPY;  Service: Endoscopy;  Laterality: N/A;   ESOPHAGOGASTRODUODENOSCOPY (EGD) WITH PROPOFOL N/A 05/07/2018   Procedure: ESOPHAGOGASTRODUODENOSCOPY (EGD) WITH PROPOFOL;  Surgeon: Lucilla Lame, MD;  Location: [facility] ENDOSCOPY;  Service:  Endoscopy;  Laterality: N/A;   ESOPHAGOGASTRODUODENOSCOPY (EGD) WITH PROPOFOL N/A 04/24/2019   Procedure: ESOPHAGOGASTRODUODENOSCOPY (EGD) WITH PROPOFOL;  Surgeon: Jonathon Bellows, MD;  Location: [facility] ENDOSCOPY;  Service: Gastroenterology;  Laterality: N/A;   ESOPHAGOGASTRODUODENOSCOPY (EGD) WITH PROPOFOL N/A 01/12/2020   Procedure: ESOPHAGOGASTRODUODENOSCOPY (EGD) WITH PROPOFOL;  Surgeon: Jonathon Bellows, MD;  Location: [facility] ENDOSCOPY;  Service: Gastroenterology;  Laterality: N/A;   ESOPHAGOGASTRODUODENOSCOPY (EGD) WITH PROPOFOL N/A 04/28/2020   Procedure: ESOPHAGOGASTRODUODENOSCOPY (EGD) WITH PROPOFOL;  Surgeon: Jonathon Bellows, MD;  Location[facility] END[facility]COPY;  Service: Gastroenterology;  Laterality: N/A;   ESOPHAGOGASTRODUODENOSCOPY (EGD) WITH PROPOFOL N/A 03/11/2022   Procedure: ESOPHAGOGASTRODUODENOSCOPY (EGD) WITH PROPOFOL;  Surgeon: Annamaria Helling, DO;  Location: [facility] ENDOSCOPY;  Service: Gastroenterology;  Laterality: N/A;   EUS N/A 05/07/2019   Procedure: FULL UPPER ENDOSCOPIC ULTRASOUND (EUS) RADIAL;  Surgeon: Jola Schmidt, MD;  Location: [facility] ENDOSCOPY;  Service: Endoscopy;  Laterality: N/A;   ILEOSCOPY N/A 07/22/2018   Procedure: ILEOSCOPY THROUGH STOMA;   Surgeon: Virgel Manifold, MD;  Location: [facility] ENDOSCOPY;  Service: Endoscopy;  Laterality: N/A;   ILEOSTOMY     ILEOSTOMY N/A 09/08/2018   Procedure: ILEOSTOMY REVISION POSSIBLE CREATION;  Surgeon: Herbert Pun, MD;  Location: [facility] ORS;  Service: General;  Laterality: N/A;   ILEOSTOMY CLOSURE N/A 08/15/2018   Procedure: DILATION OF ILEOSTOMY STRICTURE;  Surgeon: Herbert Pun, MD;  Location: [facility] ORS;  Service: General;  Laterality: N/A;   LAPAROTOMY Right 05/04/2018   Procedure: EXPLORATORY LAPAROTOMY right colectomy right and left ostomy;  Surgeon: Herbert Pun, MD;  Location: [facility] ORS;  Service: General;  Laterality: Right;   LEFT ATRIAL APPENDAGE OCCLUSION N/A 01/11/2021   Procedure: LEFT ATRIAL APPENDAGE OCCLUSION ([place]); ABORTED PROCEDURE WITHOUT DEVICE BEING IMPLANTED; Location: [facility]; Surgeon: Mylinda Latina, MD   LUNG BIOPSY     MASTECTOMY Left    2000, [place]   ROTATOR CUFF REPAIR Right    [place] TAKEDOWN N/A 10/23/2021   Procedure: XI ROBOTIC ASSISTED ILEOSTOMY TAKEDOWN;  Surgeon: Herbert Pun, MD;  Location: [facility] ORS;  Service: General;  Laterality: N/A;  180 minutes for the surgery part please    Physical  Exam:  GEN-  NAD, sitting upright in bed.  Slight amount of bright red blood coming from right nostril NEURO-  CN 2-12 grossly intact and symmetric. EARS-  clear externally, NOSE- bleeding from right nostril with blood clot filling nasal cavity.  This was suctioned revealing small clot around middle turbinate and prominent vessels of mid septum around a septal spur but no obvious source of bleeding OC/OP-  no masses or lesions, posteriorly slight trickle of bleeding NECK- no LAD RESP- oxygen present, unlabored CARD- irregular  Procedure:  Control of epistaxis.  Description of procedure:  After verbal consent was obtained, a cotton ball soaked in Gen-nasal and lidocaine was placed into the patient's  right nostril for approximately 5 minutes.  This was removed and a large clot was suctioned and expelled orally by patient.  This revealed a right side septal ridge and clot around middle turbinate but no active bleeding.  A small but prominent vein along the septal ridge was noted.  Given intermittent bleeding and anticoagulation, I attempted to place a 10cm Merocel pack but patient did not tolerate even 1cm of manipulation and asked me to stop.  Therefore I cauterized the prominent mid septal vessels with silver nitrate.  No bleeding was present at time of cautery and I am unsure if these were the inciting vessels  or not.  I feel patient still is at high risk for rebleed but she did not tolerate even the slightest attempt at placement of merocel.  A/P: Epistaxis s/p cauterization  Plan:  Patient did not tolerate placement of 10cm Merocel pack but did tolerate silver nitrate cautery.  I never was able to see active site of bleeding as it was covered with clot when she was bleeding and had stopped by the time I was able to remove clot.  If this is posterior or from the middle turbinate, the bleeding with intermittently continue.  If this was from the anterior septal ridge and from [place]/anticoagulation then the cauterization has more of a change to help.  Discussed with patient and Dr. Avon Gully.  Can consider addition of sedative type medication if needs packing to help tolerance.  Will recheck in several hours.  Recommend continued humidification and Afrin BID x 48 hours.  Will add Bactroban ointment to nose as well.   Jeannie Fend Darry Kelnhofer 05/10/2022 12:25 PM

## 2022-05-10 NOTE — Progress Notes (Signed)
Mobility Specialist - Progress Note   05/10/22 0900  Mobility  Activity Stood at bedside;Dangled on edge of bed;Transferred to/from [facility]  Level of Assistance Standby assist, set-up cues, supervision of patient - no hands on  Assistive Device None  Distance Ambulated (ft) 2 ft  Activity Response Tolerated well  $Mobility charge 1 Mobility     Author transferred pt from [facility] with SBA. To hold off on further ambulation d/t continuous nose bleed at the moment. Will attempt at another date and time.  Merrily Brittle Mobility Specialist 05/10/22, 9:33 AM

## 2022-05-10 NOTE — TOC Progression Note (Addendum)
Transition of Care [facility]) - Progression Note    Patient Details  Name: Danielle [name] MRN: [id number] Date of Birth: 1956/03/21  Transition [facility] CM/SW Contact  Beverly Sessions, RN Phone Number: 05/10/2022, 11:46 AM  Clinical Narrative:      Per Faythe Dingwall with [employer] RW delivered to room Portable O2 tank delivered to room for when patient is discharge     Expected Discharge Plan and Services                                                 Social Determinants of Health (SDOH) Interventions    Readmission Risk Interventions    05/09/2022    3:11 PM 01/29/2022   11:36 AM 11/29/2021   10:49 AM  Readmission Risk Prevention Plan  Transportation Screening Complete Complete Complete  Medication Review ([place]) Complete Complete Complete  PCP or Specialist appointment within 3-5 days of discharge  Complete Complete  HRI or Home Care Consult Patient refused  Complete  SW Recovery Care/Counseling Consult Complete Complete Complete  Palliative Care Screening Not Applicable Not Applicable Not [place] Not Applicable Not Applicable Not Applicable

## 2022-05-10 NOTE — Care Management Important Message (Signed)
Important Message  Patient Details  Name: Danielle [name] MRN: [id number] Date of Birth: [birth year]   Medicare Important Message Given:  N/A - LOS <3 / Initial given by admissions     [name] 05/10/2022, 11:30 AM

## 2022-05-10 NOTE — Progress Notes (Signed)
Pulmonary Medicine          Date: 05/10/2022,   MRN# [id number] Danielle [name] 01-10-1956     AdmissionWeight: 51.3 kg                 CurrentWeight: 51.9 kg       HISTORY OF PRESENT ILLNESS   Had nose bleed ENT  has seen, no bleeding now. Her dyspnea is no worse. Clinically the effusion is stable.    PAST MEDICAL HISTORY   Past Medical History:  Diagnosis Date   Anemia    Anxiety    Aortic atherosclerosis ([place])    Aortic valve stenosis 02/10/2018   a.) TTE 02/10/2018: EF 55-60%: mild AS with MPG of 12 mmHg. b.) TTE 04/21/2018: EF 35-40%; mild AS with MPG 8 mmHg. c.) TTE 11/07/2019: EF 50-55%; mild AS with MPG 10 mmHg.   Arthritis    Atrial fibrillation (HCC)    a.) CHA2DS2-VASc = 4 (age, sex, HTN, aortic plaque). b.) Rate/rhythm maintained on oral carvedilol; chronically anticoagulated on dose reduced apixaban. c.)  Attempted deployment of LAA occlusive device on 01/11/2021; parameters failed and procedure aborted.   Breast cancer, left ([place]) 2000   a.) T2N1M0; ER/PR (+) --> Tx'd with total mastectomy, LN resection, XRT, and chemotherapy   Cancer of right lung ([place]) 07/30/2016   a.) adenocarcinoma; ALK, ROS1, PDL1, BRAF, EGFR all negative.   Chronic diastolic CHF (congestive heart failure) ([place]) 11/26/2021   CKD (chronic kidney disease), stage IV (HCC)    COPD (chronic obstructive pulmonary disease) (HCC)    Dependence on supplemental oxygen    Depression    Diastolic dysfunction 35/00/9381   a.) TTE 02/10/2017: EF 55-60%; G2DD. b.) TTE 04/21/2018: EF 35-40%; mild LA dilation; mod MV regurgitation. c.) TTE 11/07/2019: EF 50-55%; G1DD.   DOE (dyspnea on exertion)    GERD (gastroesophageal reflux disease)    Heart murmur    History of 2019 novel coronavirus disease (COVID-19) 10/14/2019   HLD (hyperlipidemia)    Hypertension    Long term current use of anticoagulant    a.) apixaban   Lymphedema    Personal history of chemotherapy    Personal history of  radiation therapy    SBO (small bowel obstruction) ([place]) 11/05/2020   Vitamin D deficiency      SURGICAL HISTORY   Past Surgical History:  Procedure Laterality Date   Breast Biospy Left    [facility]   BREAST SURGERY     CARDIOVERSION N/A 02/23/2022   Procedure: CARDIOVERSION;  Surgeon: Corey Skains, MD;  Location: [facility] ORS;  Service: Cardiovascular;  Laterality: N/A;   COLONOSCOPY N/A 04/30/2018   Procedure: COLONOSCOPY;  Surgeon: Virgel Manifold, MD;  Location: [facility] ENDOSCOPY;  Service: Endoscopy;  Laterality: N/A;   COLONOSCOPY N/A 07/22/2018   Procedure: COLONOSCOPY;  Surgeon: Virgel Manifold, MD;  Location: [facility] ENDOSCOPY;  Service: Endoscopy;  Laterality: N/A;   COLONOSCOPY WITH PROPOFOL N/A 09/21/2021   Procedure: COLONOSCOPY WITH PROPOFOL;  Surgeon: Benjamine [place], DO;  Location: [facility] ENDOSCOPY;  Service: General;  Laterality: N/A;   COLONOSCOPY WITH PROPOFOL N/A 03/11/2022   Procedure: COLONOSCOPY WITH PROPOFOL;  Surgeon: Annamaria Helling, DO;  Location: [facility] ENDOSCOPY;  Service: Gastroenterology;  Laterality: N/A;   DILATION AND CURETTAGE OF UTERUS     ELECTROMAGNETIC NAVIGATION BROCHOSCOPY Right 04/11/2016   Procedure: ELECTROMAGNETIC NAVIGATION BRONCHOSCOPY;  Surgeon: Vilinda Boehringer, MD;  Location: [facility] ORS;  Service: Cardiopulmonary;  Laterality: Right;   ESOPHAGOGASTRODUODENOSCOPY N/A  07/22/2018   Procedure: ESOPHAGOGASTRODUODENOSCOPY (EGD);  Surgeon: Virgel Manifold, MD;  Location: [facility] ENDOSCOPY;  Service: Endoscopy;  Laterality: N/A;   ESOPHAGOGASTRODUODENOSCOPY (EGD) WITH PROPOFOL N/A 05/07/2018   Procedure: ESOPHAGOGASTRODUODENOSCOPY (EGD) WITH PROPOFOL;  Surgeon: Lucilla Lame, MD;  Location: [facility] ENDOSCOPY;  Service: Endoscopy;  Laterality: N/A;   ESOPHAGOGASTRODUODENOSCOPY (EGD) WITH PROPOFOL N/A 04/24/2019   Procedure: ESOPHAGOGASTRODUODENOSCOPY (EGD) WITH PROPOFOL;  Surgeon: Jonathon Bellows, MD;  Location: [facility] ENDOSCOPY;  Service: Gastroenterology;   Laterality: N/A;   ESOPHAGOGASTRODUODENOSCOPY (EGD) WITH PROPOFOL N/A 01/12/2020   Procedure: ESOPHAGOGASTRODUODENOSCOPY (EGD) WITH PROPOFOL;  Surgeon: Jonathon Bellows, MD;  Location: [facility] ENDOSCOPY;  Service: Gastroenterology;  Laterality: N/A;   ESOPHAGOGASTRODUODENOSCOPY (EGD) WITH PROPOFOL N/A 04/28/2020   Procedure: ESOPHAGOGASTRODUODENOSCOPY (EGD) WITH PROPOFOL;  Surgeon: Jonathon Bellows, MD;  Location: [facility] ENDOSCOPY;  Service: Gastroenterology;  Laterality: N/A;   ESOPHAGOGASTRODUODENOSCOPY (EGD) WITH PROPOFOL N/A 03/11/2022   Procedure: ESOPHAGOGASTRODUODENOSCOPY (EGD) WITH PROPOFOL;  Surgeon: Annamaria Helling, DO;  Location: [facility] ENDOSCOPY;  Service: Gastroenterology;  Laterality: N/A;   EUS N/A 05/07/2019   Procedure: FULL UPPER ENDOSCOPIC ULTRASOUND (EUS) RADIAL;  Surgeon: Jola Schmidt, MD;  Location: [facility] ENDOSCOPY;  Service: Endoscopy;  Laterality: N/A;   ILEOSCOPY N/A 07/22/2018   Procedure: ILEOSCOPY THROUGH STOMA;  Surgeon: Virgel Manifold, MD;  Location: [facility] ENDOSCOPY;  Service: Endoscopy;  Laterality: N/A;   ILEOSTOMY     ILEOSTOMY N/A 09/08/2018   Procedure: ILEOSTOMY REVISION POSSIBLE CREATION;  Surgeon: Herbert Pun, MD;  Location: [facility] ORS;  Service: General;  Laterality: N/A;   ILEOSTOMY CLOSURE N/A 08/15/2018   Procedure: DILATION OF ILEOSTOMY STRICTURE;  Surgeon: Herbert Pun, MD;  Location: [facility] ORS;  Service: General;  Laterality: N/A;   LAPAROTOMY Right 05/04/2018   Procedure: EXPLORATORY LAPAROTOMY right colectomy right and left ostomy;  Surgeon: Herbert Pun, MD;  Location: [facility] ORS;  Service: General;  Laterality: Right;   LEFT ATRIAL APPENDAGE OCCLUSION N/A 01/11/2021   Procedure: LEFT ATRIAL APPENDAGE OCCLUSION ([place]); ABORTED PROCEDURE WITHOUT DEVICE BEING IMPLANTED; Location: [facility]; Surgeon: Mylinda Latina, MD   LUNG BIOPSY     MASTECTOMY Left    2000, [place] Right    [place]   XI ROBOTIC ASSISTED  COLOSTOMY TAKEDOWN N/A 10/23/2021   Procedure: XI ROBOTIC ASSISTED ILEOSTOMY TAKEDOWN;  Surgeon: Herbert Pun, MD;  Location: [facility] ORS;  Service: General;  Laterality: N/A;  180 minutes for the surgery part please     FAMILY HISTORY   Family History  Problem Relation Age of Onset   Breast cancer Mother 11   Cancer Mother        Breast    Cirrhosis Father    Breast cancer Paternal Aunt 47   Cancer Maternal Aunt        Breast      SOCIAL HISTORY   Social History   Tobacco Use   Smoking status: Former    Packs/day: 0.50    Years: 20.00    Total pack years: 10.00    Types: Cigarettes    Quit date: 07/02/2012    Years since quitting: 9.8   Smokeless tobacco: Current    Types: Snuff  Vaping Use   Vaping Use: Never used  Substance Use Topics   Alcohol use: Yes    Alcohol/week: 3.0 standard drinks of alcohol    Types: 3 Cans of beer per week    Comment: Occasionally beer   Drug use: No     MEDICATIONS    Home Medication:  Current Medication:  Current Facility-Administered Medications:    0.9 %  sodium chloride infusion, 250 mL, Intravenous, PRN, Agbata, Tochukwu, MD   acetaminophen (TYLENOL) tablet 650 mg, 650 mg, Oral, Q4H PRN, Agbata, Tochukwu, MD, 650 mg at 05/10/22 1140   amiodarone (PACERONE) tablet 200 mg, 200 mg, Oral, BID, Agbata, Tochukwu, MD, 200 mg at 05/10/22 0828   apixaban (ELIQUIS) tablet 2.5 mg, 2.5 mg, Oral, BID, Agbata, Tochukwu, MD, 2.5 mg at 05/09/22 2119   atorvastatin (LIPITOR) tablet 40 mg, 40 mg, Oral, QPM, Agbata, Tochukwu, MD, 40 mg at 05/09/22 1728   citalopram (CELEXA) tablet 20 mg, 20 mg, Oral, Daily, Agbata, Tochukwu, MD, 20 mg at 05/10/22 4481   ferrous sulfate tablet 325 mg, 325 mg, Oral, Q breakfast, Agbata, Tochukwu, MD, 325 mg at 05/10/22 0829   fluticasone furoate-vilanterol (BREO ELLIPTA) 100-25 MCG/ACT 1 puff, 1 puff, Inhalation, Daily, 1 puff at 05/09/22 1056 **AND** umeclidinium bromide (INCRUSE ELLIPTA) 62.5 MCG/ACT 1  puff, 1 puff, Inhalation, Daily, Agbata, Tochukwu, MD, 1 puff at 05/09/22 1056   ipratropium-albuterol (DUONEB) 0.5-2.5 (3) MG/3ML nebulizer solution 3 mL, 3 mL, Nebulization, Q6H PRN, Agbata, Tochukwu, MD   metoprolol tartrate (LOPRESSOR) tablet 12.5 mg, 12.5 mg, Oral, BID, Agbata, Tochukwu, MD, 12.5 mg at 05/10/22 0828   mupirocin ointment (BACTROBAN) 2 %, , Nasal, BID, Vaught, Creighton, MD, Given at 05/10/22 1604   ondansetron (ZOFRAN) tablet 4 mg, 4 mg, Oral, Q6H PRN **OR** ondansetron (ZOFRAN) injection 4 mg, 4 mg, Intravenous, Q6H PRN, Agbata, Tochukwu, MD   oxymetazoline (AFRIN) 0.05 % nasal spray 1 spray, 1 spray, Each Nare, BID PRN, Foust, Katy L, NP, 1 spray at 05/10/22 0706   pantoprazole (PROTONIX) EC tablet 40 mg, 40 mg, Oral, Daily, Nazari, Walid A, RPH, 40 mg at 05/10/22 8563   sodium bicarbonate tablet 1,300 mg, 1,300 mg, Oral, Daily, Agbata, Tochukwu, MD, 1,300 mg at 05/10/22 0828   sodium chloride flush (NS) 0.9 % injection 3 mL, 3 mL, Intravenous, Q12H, Agbata, Tochukwu, MD, 3 mL at 05/10/22 0831   sodium chloride flush (NS) 0.9 % injection 3 mL, 3 mL, Intravenous, PRN, Agbata, Tochukwu, MD   torsemide (DEMADEX) tablet 40 mg, 40 mg, Oral, Daily, Agbata, Tochukwu, MD, 40 mg at 05/10/22 0828   zolpidem (AMBIEN) tablet 5 mg, 5 mg, Oral, QHS PRN, Agbata, Tochukwu, MD, 5 mg at 05/09/22 2118    ALLERGIES   Patient has no known allergies.     REVIEW OF SYSTEMS    Review of Systems:  Gen:  Denies  fever, sweats, chills weigh loss  HEENT: Denies blurred vision, double vision, ear pain, eye pain, hearing loss, nose bleeds, sore throat Cardiac:  No dizziness, chest pain or heaviness, chest tightness,edema Resp:   Denies cough or sputum porduction, shortness of breath,wheezing, hemoptysis,  Gi: Denies swallowing difficulty, stomach pain, nausea or vomiting, diarrhea, constipation, bowel incontinence Gu:  Denies bladder incontinence, burning urine Ext:   Denies Joint pain,  stiffness or swelling Skin: Denies  skin rash, easy bruising or bleeding or hives Endoc:  Denies polyuria, polydipsia , polyphagia or weight change Psych:   Denies depression, insomnia or hallucinations   Other:  All other systems negative   VS: BP 116/68 (BP Location: Right Arm)   Pulse 72   Temp 98.3 F (36.8 C) (Oral)   Resp 18   Ht _0  (1.651 m)   Wt 51.9 kg   SpO2 99%   BMI 19.04 kg/m      PHYSICAL EXAM  GENERAL:NAD, no fevers, chills, no weakness no fatigue HEAD: Normocephalic, atraumatic.  EYES: Pupils equal, round, reactive to light. Extraocular muscles intact. No scleral icterus.  MOUTH: Moist mucosal membrane. Dentition intact. No abscess noted.  EAR, NOSE, THROAT: Clear without exudates. No external lesions.  NECK: Supple. No thyromegaly. No nodules. No JVD.  PULMONARY: no wheezes, decrease bs at the base CARDIOVASCULAR: S1 and S2. Regular rate and rhythm. No murmurs, rubs, or gallops. No edema. Pedal pulses 2+ bilaterally.  GASTROINTESTINAL: Soft, nontender, nondistended. No masses. Positive bowel sounds. No hepatosplenomegaly.  MUSCULOSKELETAL: No swelling, clubbing, or edema. Range of motion full in all extremities.  NEUROLOGIC: Cranial nerves II through XII are intact. No gross focal neurological deficits. Sensation intact. Reflexes intact.  SKIN: No ulceration, lesions, rashes, or cyanosis. Skin warm and dry. Turgor intact.  PSYCHIATRIC: Mood, affect within normal limits. The patient is awake, alert and oriented x 3. Insight, judgment intact.       IMAGING    DG Chest Port 1 View  Result Date: 05/08/2022 CLINICAL DATA:  Status post thoracentesis. EXAM: PORTABLE CHEST 1 VIEW COMPARISON:  Chest x-ray, same date. FINDINGS: Interval evacuation of the right pleural fluid collection. No postprocedural pneumothorax. IMPRESSION: Status post right thoracentesis. No residual pleural fluid and no pneumothorax. Electronically Signed   By: Marijo Sanes M.D.   On:  05/08/2022 12:18   US THORACENTESIS ASP PLEURAL SPACE W/IMG GUIDE  Result Date: 05/08/2022 INDICATION: Patient with recurrent right pleural effusion, request received for diagnostic and therapeutic thoracentesis. Patient with known atrial fibrillation, chronic kidney disease, history of adenocarcinoma of the right lung status post radiation and congestive heart failure. EXAM: ULTRASOUND GUIDED RIGHT THORACENTESIS MEDICATIONS: Local 1% lidocaine only. COMPLICATIONS: None immediate. PROCEDURE: An ultrasound guided thoracentesis was thoroughly discussed with the patient and questions answered. The benefits, risks, alternatives and complications were also discussed. The patient understands and wishes to proceed with the procedure. Written consent was obtained. Ultrasound was performed to localize and mark an adequate pocket of fluid in the right chest. The area was then prepped and draped in the normal sterile fashion. 1% Lidocaine was used for local anesthesia. Under ultrasound guidance a 19 gauge, 7-cm, Yueh catheter was introduced. Thoracentesis was performed. The catheter was removed and a dressing applied. FINDINGS: A total of approximately 1.2 L of clear yellow fluid was removed. Samples were sent to the laboratory as requested by the clinical team. IMPRESSION: Successful ultrasound guided right thoracentesis yielding 1.2 L of pleural fluid. This exam was performed by Tsosie Billing PA-C, and was supervised and interpreted by Dr. Kathlene Cote. Electronically Signed   By: Aletta Edouard M.D.   On: 05/08/2022 12:10   CT ABDOMEN PELVIS WO CONTRAST  Result Date: 05/08/2022 CLINICAL DATA:  Acute non localized abdominal pain for several days. Anorexia. EXAM: CT ABDOMEN AND PELVIS WITHOUT CONTRAST TECHNIQUE: Multidetector CT imaging of the abdomen and pelvis was performed following the standard protocol without IV contrast. RADIATION DOSE REDUCTION: This exam was performed according to the departmental  dose-optimization program which includes automated exposure control, adjustment of the mA and/or kV according to patient size and/or use of iterative reconstruction technique. COMPARISON:  04/13/2022 FINDINGS: Lower chest: Moderate right pleural effusion, without significant change. Hepatobiliary: Small hepatic cysts remains stable. No new or enlarging liver lesions seen on this unenhanced exam. High attenuation gallbladder sludge again noted, however there is no evidence of cholecystitis or biliary ductal dilatation. Pancreas: No mass or inflammatory process visualized on this unenhanced exam. Spleen:  Within normal limits in size. Adrenals/Urinary tract: No evidence of urolithiasis or hydronephrosis. Unremarkable unopacified urinary bladder. Stomach/Bowel: No evidence of obstruction, inflammatory process, or abnormal fluid collections. Diverticulosis is seen mainly involving the sigmoid colon, however there is no evidence of diverticulitis. Vascular/Lymphatic: No pathologically enlarged lymph nodes identified. No evidence of abdominal aortic aneurysm. Aortic atherosclerotic calcification incidentally noted. Reproductive: No mass or other significant abnormality. Bilateral tubal ligation clips are noted. Other:  None. Musculoskeletal:  No suspicious bone lesions identified. IMPRESSION: No evidence of urolithiasis, hydronephrosis, or other acute findings. Colonic diverticulosis, without radiographic evidence of diverticulitis. Stable moderate right pleural effusion. Electronically Signed   By: Marlaine Hind M.D.   On: 05/08/2022 09:12   CT HEAD WO CONTRAST (5MM)  Result Date: 05/08/2022 CLINICAL DATA:  Dizziness, persistent/recurrent, cardiac or vascular cause suspected EXAM: CT HEAD WITHOUT CONTRAST TECHNIQUE: Contiguous axial images were obtained from the base of the skull through the vertex without intravenous contrast. RADIATION DOSE REDUCTION: This exam was performed according to the departmental  dose-optimization program which includes automated exposure control, adjustment of the mA and/or kV according to patient size and/or use of iterative reconstruction technique. COMPARISON:  01/29/2022 FINDINGS: Brain: No evidence of acute infarction, hemorrhage, hydrocephalus, extra-axial collection or mass lesion/mass effect. Scattered low-density changes within the periventricular and subcortical white matter compatible with chronic microvascular ischemic change. Mild diffuse cerebral volume loss. Vascular: Atherosclerotic calcifications involving the large vessels of the skull base. No unexpected hyperdense vessel. Skull: Normal. Negative for fracture or focal lesion. Sinuses/Orbits: No acute finding. Other: None. IMPRESSION: 1. No acute intracranial abnormality. 2. Mild chronic microvascular ischemic change and cerebral volume loss. Electronically Signed   By: Davina Poke D.O.   On: 05/08/2022 09:10   DG Chest Portable 1 View  Result Date: 05/08/2022 CLINICAL DATA:  Shortness of breath EXAM: PORTABLE CHEST 1 VIEW COMPARISON:  Chest x-ray dated April 30, 2021 FINDINGS: Cardiac and mediastinal contours are unchanged. Background emphysema. Stable right perihilar and right upper lobe nodular opacities. No new parenchymal opacity. Stable small right pleural effusion. No evidence of pneumothorax. IMPRESSION: Stable small right pleural effusion and right lung nodular opacities. Electronically Signed   By: Yetta Glassman M.D.   On: 05/08/2022 08:34   DG Chest 2 View  Result Date: 04/30/2022 CLINICAL DATA:  Chest pressure. EXAM: CHEST - 2 VIEW COMPARISON:  Chest radiograph dated 04/13/2022. FINDINGS: Slight improvement of vascular congestion since the prior radiograph. Similar appearance of right pleural effusion and right lung base atelectasis. Pneumonia is not excluded. The left lung is clear. No pneumothorax. Mild cardiomegaly. Atherosclerotic calcification of the aorta. No acute osseous pathology.  IMPRESSION: 1. Slight improvement of vascular congestion. 2. Similar appearance of right pleural effusion and right lung base atelectasis. Electronically Signed   By: Anner Crete M.D.   On: 04/30/2022 01:20   CT ABDOMEN PELVIS W CONTRAST  Result Date: 04/13/2022 CLINICAL DATA:  Abdominal pain EXAM: CT ABDOMEN AND PELVIS WITH CONTRAST TECHNIQUE: Multidetector CT imaging of the abdomen and pelvis was performed using the standard protocol following bolus administration of intravenous contrast. RADIATION DOSE REDUCTION: This exam was performed according to the departmental dose-optimization program which includes automated exposure control, adjustment of the mA and/or kV according to patient size and/or use of iterative reconstruction technique. CONTRAST:  62m OMNIPAQUE IOHEXOL 300 MG/ML  SOLN COMPARISON:  CT 11/26/2021, 02/21/2022 FINDINGS: Lower chest: Persistent moderate-sized layering right-sided pleural effusion with associated right basilar atelectasis. Mild cardiomegaly. Hepatobiliary: Stable small hepatic cysts. No  new focal liver abnormality. Mildly decreased attenuation of the hepatic parenchyma. Gallbladder within normal limits. Pancreas: Unremarkable. No pancreatic ductal dilatation or surrounding inflammatory changes. Spleen: Normal in size without focal abnormality. Adrenals/Urinary Tract: Adrenal glands are unremarkable. Kidneys are within normal limits. Renal calculi, solid lesion, or hydronephrosis. Bladder is unremarkable. Stomach/Bowel: Stomach within normal limits. No dilated loops of bowel. Postsurgical changes of prior right hemicolectomy. Left-sided colonic diverticulosis. No focal bowel wall thickening or inflammatory changes. Vascular/Lymphatic: Extensive atherosclerotic calcification throughout the aortoiliac axis. No aneurysm. No abdominopelvic lymphadenopathy. Reproductive: Uterus and bilateral adnexa are unremarkable. Other: No free fluid. No abdominopelvic fluid collection. No  pneumoperitoneum. No abdominal wall hernia. Musculoskeletal: No acute or significant osseous findings. Chronic mild superior endplate compression deformities of L3 and L5. IMPRESSION: 1. No acute abdominopelvic findings. 2. Colonic diverticulosis without evidence of acute diverticulitis. 3. Persistent moderate-sized layering right-sided pleural effusion with associated right basilar atelectasis, similar to prior. 4. Aortic and coronary artery atherosclerosis (ICD10-I70.0). Electronically Signed   By: Davina Poke D.O.   On: 04/13/2022 15:57   DG Chest 2 View  Result Date: 04/13/2022 CLINICAL DATA:  PCP heard crackles and read low oxygen (46% w/out o2) and was concerned for fluid buildup, pt c/o chest pressure x3 days. Pt uses 3 liter's chonically. Pt 93% on 3 liter's currently. EXAM: CHEST - 2 VIEW COMPARISON:  03/09/2022 FINDINGS: Persistent moderate right pleural effusion, with adjacent atelectasis/consolidation right lung base. Coarse right perihilar linear opacities as before. Mild central pulmonary vascular congestion, slightly increased since previous. Heart size upper limits normal. Aortic Atherosclerosis (ICD10-170.0). Old left rib fracture deformities. IMPRESSION: 1. Interval increase in mild pulmonary vascular congestion 2. Persistent right pleural effusion and right lower lung consolidation/atelectasis. Electronically Signed   By: Lucrezia Europe M.D.   On: 04/13/2022 12:09      ASSESSMENT/PLAN   1 right recurrent pleural effusion. S/p right thoracentesis, feeling better. Hs of right squamous cell, s/p xrt. Transudate. D/dx cardiac ( ef 35 %, elevated BNP), doubt low protein (typically leak fluid or third space when the albumen is less 1.2-1,6 or so). She has renal insufficiency no peripheral edema doubt renal a major cause. Profound hypothyroidism can also cause bilateral pleural effusions. Not the case now. -out patient serial cxr ( will see I1week post d/c) -cautious diuresis -cardiology  consult   2 Very severe copd -continue Albuterol and trelegy or the equivalent -oxygen supplementation, keep sats 92 %   3 recurrent nose bleeds on eliquis and [place] 02, ENT cuaterized today -following ENT recs           Thank you for allowing me to participate in the care of this patient.   Patient/Family are satisfied with care plan and all questions have been answered.  This document was prepared using Dragon voice recognition software and may include unintentional dictation errors.     Wallene Huh, M.D.  Division of [place]

## 2022-05-11 DIAGNOSIS — J90 Pleural effusion, not elsewhere classified: Secondary | ICD-10-CM | POA: Diagnosis not present

## 2022-05-11 LAB — CBC
HCT: 24.7 % — ABNORMAL LOW (ref 36.0–46.0)
Hemoglobin: 8.2 g/dL — ABNORMAL LOW (ref 12.0–15.0)
MCH: 31.9 pg (ref 26.0–34.0)
MCHC: 33.2 g/dL (ref 30.0–36.0)
MCV: 96.1 fL (ref 80.0–100.0)
Platelets: 215 10*3/uL (ref 150–400)
RBC: 2.57 MIL/uL — ABNORMAL LOW (ref 3.87–5.11)
RDW: 14.6 % (ref 11.5–15.5)
WBC: 6.8 10*3/uL (ref 4.0–10.5)
nRBC: 0 % (ref 0.0–0.2)

## 2022-05-11 LAB — BASIC METABOLIC PANEL
Anion gap: 8 (ref 5–15)
BUN: 36 mg/dL — ABNORMAL HIGH (ref 8–23)
CO2: 30 mmol/L (ref 22–32)
Calcium: 8.5 mg/dL — ABNORMAL LOW (ref 8.9–10.3)
Chloride: 101 mmol/L (ref 98–111)
Creatinine, Ser: 2 mg/dL — ABNORMAL HIGH (ref 0.44–1.00)
GFR, Estimated: 27 mL/min — ABNORMAL LOW (ref >60)
Glucose, Bld: 86 mg/dL (ref 70–99)
Potassium: 3.2 mmol/L — ABNORMAL LOW (ref 3.5–5.1)
Sodium: 139 mmol/L (ref 135–145)

## 2022-05-11 LAB — BODY FLUID CULTURE W GRAM STAIN: Culture: NO GROWTH

## 2022-05-11 MED ORDER — POTASSIUM CHLORIDE CRYS ER 20 MEQ PO TBCR
40.0000 meq | EXTENDED_RELEASE_TABLET | Freq: Every day | ORAL | Status: DC
  Administered 2022-05-11 – 2022-05-12 (×2): 40 meq via ORAL
  Filled 2022-05-11 (×2): qty 2

## 2022-05-11 MED ORDER — TRAMADOL HCL 50 MG PO TABS
50.0000 mg | ORAL_TABLET | Freq: Four times a day (QID) | ORAL | Status: DC | PRN
  Administered 2022-05-11 (×2): 50 mg via ORAL
  Filled 2022-05-11 (×2): qty 1

## 2022-05-11 NOTE — Progress Notes (Signed)
PROGRESS NOTE    LAELYNN BLIZZARD  JAS:[id number] DOB: 09-26-1956 DOA: 05/08/2022 PCP: [name], PA   Brief Narrative:  ZAIN BINGMAN is a 66 y.o. female with medical history significant for A-fib on chronic anticoagulation therapy, history of chronic systolic heart failure, history of adenocarcinoma of the right lung status post radiation therapy, history of stage IIIb chronic kidney disease, aortic valve stenosis, COPD with chronic respiratory failure on 4 L of oxygen continuous, history of left breast cancer status post mastectomy, history of esophageal cancer who presents to the ER for evaluation of worsening shortness of breath from her baseline with recurrent moderate right pleural effusion  Assessment & Plan:   Principal Problem:   Recurrent right pleural effusion Active Problems:   Paroxysmal atrial fibrillation (HCC)   Chronic systolic heart failure (HCC)   Malignant neoplasm of upper lobe of right lung (HCC)   Malignant neoplasm of lower third of esophagus (HCC)   GERD (gastroesophageal reflux disease)   Depression   CKD (chronic kidney disease), stage IV (HCC)   COPD, severe (HCC)   Profound epistaxis, acute, stabilizing -Exacerbated by high flow oxygen and anticoagulation -ENT consulted, appreciate insight recommendations, bedside cautery previously, she has not required packing since this intervention -We will discontinue Eliquis, this appears to be at least patient's second episode of profound bleeding on Eliquis, given her age fall risk initial indication with paroxysmal A-fib who appears to be maintaining sinus rhythm we discussed her risk for bleeding outweighs any benefit at this point I will need to follow-up with her cardiologist for further discussion.  We discussed possible need for Holter monitor to further evaluate patient's A-fib burden.  Recurrent right pleural effusion Heart failure, not in acute exacerbation, Systolic - EF 41-93% Transudative by  appearance - > likely heart failure/diet/fluid intake related Pulm following - appreciate insight/recs   Paroxysmal atrial fibrillation (HCC) Continue amiodarone for rate control as well as low-dose beta-blockers Discontinue Eliquis as above given risk for bleeding, will need further follow-up with cardiology for A-fib burden given her paroxysmal status her risk versus benefit of anticoagulation in the setting of bleeding will need further evaluation.   Chronic systolic heart failure (HCC) Stable and not acutely exacerbated Concern above effusion related to poor diet/lifestyle regimen Last known LVEF of 35 to 40% with moderate aortic stenosis Continue torsemide and metoprolol  COPD, severe ([place]), not in acute exacerbation Patient has a history of severe COPD with chronic respiratory failure on 4 L of oxygen continuous Place patient on as needed and scheduled bronchodilator therapy Continue inhaled steroids Continue ambulatory oxygen screening  CKD (chronic kidney disease), stage IV (HCC) Stable  Monitor renal function closely during this hospitalization  Chronic anemia of chronic disease  -Acutely exacerbated by epistaxis -Likely secondary to CKD/history of malignancy  Depression Stable Continue Celexa  GERD (gastroesophageal reflux disease) Stable  Continue PPI  Malignant neoplasm of lower third of esophagus ([place]) Patient has a history of esophageal cancer and is status post radiation therapy Follow up with oncology as an outpatient   Malignant neoplasm of upper lobe of right lung ([place]) Patient has a history of adenocarcinoma of the right lung and is status post radiation therapy Follow-up with oncology as an outpatient  DVT prophylaxis: **Eliquis currently being held given above** Code Status: Full Family Communication: None present  Status is: Inpt  Dispo: The patient is from: Home              Anticipated d/c is to: Home  Anticipated d/c date is:  24-48h              Patient currently NOT medically stable for discharge  Consultants:  Pulmonology, IR  Procedures:  R thoracentesis  Antimicrobials:  None   Subjective: No acute issues/events overnight  Objective: Vitals:   05/10/22 0940 05/10/22 1603 05/10/22 1916 05/11/22 0348  BP:  116/68 110/65 131/79  Pulse:  72 81 66  Resp:  18 16 16   Temp: 97.9 F (36.6 C) 98.3 F (36.8 C) 99 F (37.2 C) 98.1 F (36.7 C)  TempSrc: Oral Oral Oral Oral  SpO2:  99% 93% 100%  Weight:      Height:        Intake/Output Summary (Last 24 hours) at 05/11/2022 0752 Last data filed at 05/10/2022 2036 Gross per 24 hour  Intake 480 ml  Output 0 ml  Net 480 ml    Filed Weights   05/08/22 0756 05/08/22 2216  Weight: 51.3 kg 51.9 kg    Examination:  General exam: Appears calm and comfortable  Respiratory system: Diminshed - scant R sided rales Cardiovascular system: S1 & S2 heard, RRR. No JVD, murmurs, rubs, gallops or clicks. No pedal edema. Gastrointestinal system: Abdomen is nondistended, soft and nontender. No organomegaly or masses felt. Normal bowel sounds heard. Central nervous system: Alert and oriented. No focal neurological deficits. Extremities: Symmetric 5 x 5 power. Skin: No rashes, lesions or ulcers Psychiatry: Judgement and insight appear normal. Mood & affect appropriate.   Data Reviewed: I have personally reviewed following labs and imaging studies  CBC: Recent Labs  Lab 05/08/22 0837 05/09/22 0441 05/10/22 0427 05/10/22 0907 05/11/22 0437  WBC 6.5 3.1* 6.3  --  6.8  HGB 9.3* 8.3* 8.3* 8.3* 8.2*  HCT 28.4* 23.9* 24.5* 25.4* 24.7*  MCV 96.9 93.4 94.6  --  96.1  PLT 226 216 220  --  696    Basic Metabolic Panel: Recent Labs  Lab 05/08/22 0837 05/09/22 0441 05/10/22 0427 05/11/22 0437  NA 137 136 139 139  K 3.6 3.5 3.0* 3.2*  CL 98 97* 100 101  CO2 26 29 31 30   GLUCOSE 90 125* 89 86  BUN 24* 30* 37* 36*  CREATININE 1.53* 1.74* 1.93* 2.00*   CALCIUM 8.7* 8.6* 8.5* 8.5*    GFR: Estimated Creatinine Clearance: 22.1 mL/min (A) (by C-G formula based on SCr of 2 mg/dL (H)). Liver Function Tests: Recent Labs  Lab 05/08/22 0837  AST 36  ALT 18  ALKPHOS 63  BILITOT 1.1  PROT 7.3  ALBUMIN 3.7    No results for input(s): "LIPASE", "AMYLASE" in the last 168 hours. No results for input(s): "AMMONIA" in the last 168 hours. Coagulation Profile: Recent Labs  Lab 05/08/22 0837  INR 1.2    Cardiac Enzymes: No results for input(s): "CKTOTAL", "CKMB", "CKMBINDEX", "TROPONINI" in the last 168 hours. BNP (last 3 results) No results for input(s): "PROBNP" in the last 8760 hours. HbA1C: No results for input(s): "HGBA1C" in the last 72 hours. CBG: Recent Labs  Lab 05/08/22 0915  GLUCAP 87    Lipid Profile: No results for input(s): "CHOL", "HDL", "LDLCALC", "TRIG", "CHOLHDL", "LDLDIRECT" in the last 72 hours. Thyroid Function Tests: No results for input(s): "TSH", "T4TOTAL", "FREET4", "T3FREE", "THYROIDAB" in the last 72 hours. Anemia Panel: No results for input(s): "VITAMINB12", "FOLATE", "FERRITIN", "TIBC", "IRON", "RETICCTPCT" in the last 72 hours. Sepsis Labs: No results for input(s): "PROCALCITON", "LATICACIDVEN" in the last 168 hours.  Recent Results (from the  past 240 hour(s))  SARS Coronavirus 2 by RT PCR (hospital order, performed in [facility] hospital lab) *cepheid single result test* Anterior Nasal Swab     Status: None   Collection Time: 05/08/22  8:37 AM   Specimen: Anterior Nasal Swab  Result Value Ref Range Status   SARS Coronavirus 2 by RT PCR NEGATIVE NEGATIVE Final    Comment: (NOTE) SARS-CoV-2 target nucleic acids are NOT DETECTED.  The SARS-CoV-2 RNA is generally detectable in upper and lower respiratory specimens during the acute phase of infection. The lowest concentration of SARS-CoV-2 viral copies this assay can detect is 250 copies / mL. A negative result does not preclude SARS-CoV-2  infection and should not be used as the sole basis for treatment or other patient management decisions.  A negative result may occur with improper specimen collection / handling, submission of specimen other than nasopharyngeal swab, presence of viral mutation(s) within the areas targeted by this assay, and inadequate number of viral copies (<250 copies / mL). A negative result must be combined with clinical observations, patient history, and epidemiological information.  Fact Sheet for Patients:   https://www.patel.info/  Fact Sheet for Healthcare Providers: https://hall.com/  This test is not yet approved or  cleared by the [place] FDA and has been authorized for detection and/or diagnosis of SARS-CoV-2 by FDA under an Emergency Use Authorization (EUA).  This EUA will remain in effect (meaning this test can be used) for the duration of the COVID-19 declaration under Section 564(b)(1) of the Act, 21 U.S.C. section 360bbb-3(b)(1), unless the authorization is terminated or revoked sooner.  Performed at [facility], [address] [place], [place] [address]   Body fluid culture w Gram Stain     Status: None (Preliminary result)   Collection Time: 05/08/22 12:08 PM   Specimen: PATH Cytology Pleural fluid  Result Value Ref Range Status   Specimen Description   Final    PLEURAL Performed at [facility], [address], [place], [place] [address]    Special Requests   Final    NONE Performed at [facility], [address] [place], [place] [address]    Gram Stain   Final    RARE WBC PRESENT, PREDOMINANTLY MONONUCLEAR NO ORGANISMS SEEN    Culture   Final    NO GROWTH 2 DAYS Performed at [facility], [place] [address] [place], [place] [address]    Report Status PENDING  Incomplete         Radiology Studies: No results found.      Scheduled Meds:  amiodarone  200 mg Oral BID    apixaban  2.5 mg Oral BID   atorvastatin  40 mg Oral QPM   citalopram  20 mg Oral Daily   ferrous sulfate  325 mg Oral Q breakfast   fluticasone furoate-vilanterol  1 puff Inhalation Daily   And   umeclidinium bromide  1 puff Inhalation Daily   metoprolol tartrate  12.5 mg Oral BID   mupirocin ointment   Nasal BID   pantoprazole  40 mg Oral Daily   sodium bicarbonate  1,300 mg Oral Daily   sodium chloride flush  3 mL Intravenous Q12H   torsemide  40 mg Oral Daily   Continuous Infusions:  sodium chloride       LOS: 3 days   Time spent: 37min  Aviv Rota C Sevin Langenbach, DO [facility]  If 7PM-7AM, please contact night-coverage [email or web address]  05/11/2022, 7:52 AM

## 2022-05-11 NOTE — Progress Notes (Addendum)
Mobility Specialist - Progress Note   05/11/22 1500  Mobility  Activity Ambulated with assistance in hallway  Level of Assistance Standby assist, set-up cues, supervision of patient - no hands on  Assistive Device Front wheel walker  Distance Ambulated (ft) 50 ft  Activity Response Tolerated well  $Mobility charge 1 Mobility     Pre-mobility: 79 HR, 98% SpO2 During mobility: 85 HR, 90-96% SpO2 Post-mobility: 88 HR, 96% SpO2   Pt ambulated in hallway with supervision utilizing 2L. Trialed RA with O2 desatting to 81% during ambulation, returned to 2L with sats maintaining > 90%. Mild SOB and fatigue with activity. Pt returned to bed with alarm set, needs in reach.    Kathee Delton Mobility Specialist 05/11/22, 3:20 PM

## 2022-05-12 DIAGNOSIS — J449 Chronic obstructive pulmonary disease, unspecified: Secondary | ICD-10-CM

## 2022-05-12 DIAGNOSIS — I5022 Chronic systolic (congestive) heart failure: Secondary | ICD-10-CM | POA: Diagnosis not present

## 2022-05-12 DIAGNOSIS — N184 Chronic kidney disease, stage 4 (severe): Secondary | ICD-10-CM

## 2022-05-12 DIAGNOSIS — J9611 Chronic respiratory failure with hypoxia: Secondary | ICD-10-CM | POA: Diagnosis not present

## 2022-05-12 DIAGNOSIS — J90 Pleural effusion, not elsewhere classified: Secondary | ICD-10-CM | POA: Diagnosis not present

## 2022-05-12 LAB — CBC
HCT: 24.8 % — ABNORMAL LOW (ref 36.0–46.0)
Hemoglobin: 8 g/dL — ABNORMAL LOW (ref 12.0–15.0)
MCH: 31.3 pg (ref 26.0–34.0)
MCHC: 32.3 g/dL (ref 30.0–36.0)
MCV: 96.9 fL (ref 80.0–100.0)
Platelets: 223 10*3/uL (ref 150–400)
RBC: 2.56 MIL/uL — ABNORMAL LOW (ref 3.87–5.11)
RDW: 14.6 % (ref 11.5–15.5)
WBC: 7 10*3/uL (ref 4.0–10.5)
nRBC: 0 % (ref 0.0–0.2)

## 2022-05-12 LAB — BASIC METABOLIC PANEL
Anion gap: 6 (ref 5–15)
BUN: 42 mg/dL — ABNORMAL HIGH (ref 8–23)
CO2: 30 mmol/L (ref 22–32)
Calcium: 8.5 mg/dL — ABNORMAL LOW (ref 8.9–10.3)
Chloride: 100 mmol/L (ref 98–111)
Creatinine, Ser: 2.07 mg/dL — ABNORMAL HIGH (ref 0.44–1.00)
GFR, Estimated: 26 mL/min — ABNORMAL LOW (ref >60)
Glucose, Bld: 91 mg/dL (ref 70–99)
Potassium: 3.8 mmol/L (ref 3.5–5.1)
Sodium: 136 mmol/L (ref 135–145)

## 2022-05-12 MED ORDER — MUPIROCIN 2 % EX OINT: TOPICAL_OINTMENT | Freq: Two times a day (BID) | CUTANEOUS | 0 refills | Status: DC

## 2022-05-12 MED ORDER — OXYMETAZOLINE HCL 0.05 % NA SOLN: 1.0000 | Freq: Two times a day (BID) | NASAL | 0 refills | Status: AC | PRN

## 2022-05-12 NOTE — Progress Notes (Signed)
Discharge instructions reviewed with patient. She states she has no questions at this time. PIV removed with no complications. Patient states she is [place] with her daughter

## 2022-05-12 NOTE — Discharge Summary (Signed)
Physician Discharge Summary   Patient: Danielle [name] MRN: [id number] DOB: [birth year]  Admit date:     05/08/2022  Discharge date: 05/12/22  Discharge Physician: Fritzi Mandes   PCP: [name], PA   Recommendations at discharge:    F/u PCP in 1-2 weeks F/u Dr Raul Del and dr [name] on your appt  Discharge Diagnoses: acute epistaxis resolved  Hospital Course: Danielle [name] is a 66 y.o. female with medical history significant for A-fib on chronic anticoagulation therapy, history of chronic systolic heart failure, history of adenocarcinoma of the right lung status post radiation therapy, history of stage IIIb chronic kidney disease, aortic valve stenosis, COPD with chronic respiratory failure on 4 L of oxygen continuous, history of left breast cancer status post mastectomy, history of esophageal cancer who presents to the ER for evaluation of worsening shortness of breath from her baseline with recurrent moderate right pleural effusion.  Profound epistaxis, acute, stabilizing -Exacerbated by high flow oxygen and anticoagulation -ENT consulted, appreciate insight recommendations, bedside cautery previously, she has not required packing since this intervention -We will discontinue Eliquis, this appears to be at least patient's second episode of profound bleeding on Eliquis, given her age fall risk initial indication with paroxysmal A-fib who appears to be maintaining sinus rhythm we discussed her risk for bleeding outweighs any benefit at this point she will need to follow-up with her cardiologist (dr Karie Chimera appt) for further discussion.     Recurrent right pleural effusion Heart failure, not in acute exacerbation, Systolic - EF 62-83% --Transudative by appearance - > likely heart failure/diet/fluid intake related --Pulm following - appreciate insight/recs--Dr Fleming   Paroxysmal atrial fibrillation (HCC) --Continue amiodarone for rate control as well as low-dose  beta-blockers --Discontinue Eliquis as above given risk for bleeding, will need further follow-up with cardiology for A-fib burden given her paroxysmal status her risk versus benefit of anticoagulation in the setting of bleeding will need further evaluation.   Chronic systolic heart failure (HCC) --Stable and not acutely exacerbated -Last known LVEF of 35 to 40% with moderate aortic stenosis --Continue torsemide and metoprolol   COPD, severe ([place]), not in acute exacerbation --Patient has a history of severe COPD with chronic respiratory failure on 4 L of oxygen continuous --Continue inhaled steroids   CKD (chronic kidney disease), stage IV (HCC) -Stable   Chronic anemia of chronic disease  -Acutely exacerbated by epistaxis -Likely secondary to CKD/history of malignancy   Depression Continue Celexa   GERD (gastroesophageal reflux disease)  Continue PPI   Malignant neoplasm of lower third of esophagus (HCC) Patient has a history of esophageal cancer and is status post radiation therapy Follow up with oncology as an outpatient   Malignant neoplasm of upper lobe of right lung ([place]) Patient has a history of adenocarcinoma of the right lung and is status post radiation therapy Follow-up with oncology as an outpatient   DVT prophylaxis: **Eliquis currently being held given above** Code Status: Full Family Communication: None present   Status is: Inpt   Dispo: The patient is from: Home              Anticipated d/c is to: Home              Anticipated d/c date is: today                     Consultants: ENT, Pulm  Disposition: Home Diet recommendation:  Discharge Diet Orders (From admission, onward)     Start  Ordered   05/12/22 0000  Diet - low sodium heart healthy        05/12/22 1009           Cardiac diet DISCHARGE MEDICATION: Allergies as of 05/12/2022   No Known Allergies      Medication List     STOP taking these medications    amLODipine 5  MG tablet Commonly known as: NORVASC   apixaban 2.5 MG Tabs tablet Commonly known as: ELIQUIS   feeding supplement (NEPRO CARB STEADY) Liqd   midodrine 5 MG tablet Commonly known as: PROAMATINE       TAKE these medications    acetaminophen 325 MG tablet Commonly known as: TYLENOL Take 2 tablets (650 mg total) by mouth every 6 (six) hours as needed for mild pain (or Fever >/= 101). What changed:  when to take this reasons to take this   albuterol 108 (90 Base) MCG/ACT inhaler Commonly known as: VENTOLIN HFA Inhale 2 puffs into the lungs every 6 (six) hours as needed for wheezing or shortness of breath.   amiodarone 200 MG tablet Commonly known as: PACERONE Take 1 tablet (200 mg total) by mouth 2 (two) times daily.   atorvastatin 40 MG tablet Commonly known as: LIPITOR Take 1 tablet (40 mg total) by mouth daily.   calcium carbonate 500 MG chewable tablet Commonly known as: TUMS - dosed in mg elemental calcium Chew 1 tablet by mouth 2 (two) times daily as needed.   citalopram 20 MG tablet Commonly known as: CELEXA Take 1 tablet (20 mg total) by mouth daily.   ferrous sulfate 325 (65 FE) MG tablet Take 325 mg by mouth daily with breakfast.   metoprolol tartrate 25 MG tablet Commonly known as: LOPRESSOR Take 1 tablet (25 mg total) by mouth 2 (two) times daily.   mupirocin ointment 2 % Commonly known as: BACTROBAN Place into the nose 2 (two) times daily.   OXYGEN Inhale 3 L into the lungs as needed (shortness of breath).   oxymetazoline 0.05 % nasal spray Commonly known as: AFRIN Place 1 spray into both nostrils 2 (two) times daily as needed for up to 3 days for congestion (epistaxis).   pantoprazole 40 MG tablet Commonly known as: Protonix Take 1 tablet (40 mg total) by mouth 2 (two) times daily before a meal.   sodium bicarbonate 650 MG tablet Take 1,300 mg by mouth daily.   Torsemide 40 MG Tabs Take 40 mg by mouth daily.   Trelegy Ellipta  100-62.5-25 MCG/ACT Aepb Generic drug: Fluticasone-Umeclidin-Vilant Inhale 1 puff into the lungs daily.   zolpidem 5 MG tablet Commonly known as: AMBIEN Take 5 mg by mouth at bedtime as needed for sleep.               Durable Medical Equipment  (From admission, onward)           Start     Ordered   05/11/22 1316  For home use only DME Other see comment  Once       Comments: OCD eval  Question:  Length of Need  Answer:  Lifetime   05/11/22 1315   05/09/22 1523  For home use only DME lightweight manual wheelchair with seat cushion  Once       Comments: Patient suffers from Severe COPD which impairs their ability to perform daily activities like dressing and toileting in the home.  A walker will not resolve  issue with performing activities of daily living. A wheelchair  will allow patient to safely perform daily activities. Patient is not able to propel themselves in the home using a standard weight wheelchair due to endurance and general weakness. Patient can self propel in the lightweight wheelchair. Length of need Lifetime. Accessories: elevating leg rests (ELRs), wheel locks, extensions and anti-tippers.   05/09/22 1523            Follow-up Information     [name], [name]. Call in 1 week(s).   Specialty: Family Medicine Why: pt to call and make f/u appt Contact information: [place] [address] [phone]         Erby Pian, MD. Call in 1 week(s).   Specialty: Specialist Why: pt to call and make appt Contact information: [place] [address] [phone]         Corey Skains, MD. Go to.   Specialty: Cardiology Why: onyour scheduled appt in August Contact information: [address] [place] [address] [phone]                Discharge Exam: Danley Danker Weights   05/08/22 0756 05/08/22 2216  Weight: 51.3 kg 51.9 kg     Condition at  discharge: fair  The results of significant diagnostics from this hospitalization (including imaging, microbiology, ancillary and laboratory) are listed below for reference.   Imaging Studies: DG Chest Port 1 View  Result Date: 05/08/2022 CLINICAL DATA:  Status post thoracentesis. EXAM: PORTABLE CHEST 1 VIEW COMPARISON:  Chest x-ray, same date. FINDINGS: Interval evacuation of the right pleural fluid collection. No postprocedural pneumothorax. IMPRESSION: Status post right thoracentesis. No residual pleural fluid and no pneumothorax. Electronically Signed   By: Marijo Sanes M.D.   On: 05/08/2022 12:18   US THORACENTESIS ASP PLEURAL SPACE W/IMG GUIDE  Result Date: 05/08/2022 INDICATION: Patient with recurrent right pleural effusion, request received for diagnostic and therapeutic thoracentesis. Patient with known atrial fibrillation, chronic kidney disease, history of adenocarcinoma of the right lung status post radiation and congestive heart failure. EXAM: ULTRASOUND GUIDED RIGHT THORACENTESIS MEDICATIONS: Local 1% lidocaine only. COMPLICATIONS: None immediate. PROCEDURE: An ultrasound guided thoracentesis was thoroughly discussed with the patient and questions answered. The benefits, risks, alternatives and complications were also discussed. The patient understands and wishes to proceed with the procedure. Written consent was obtained. Ultrasound was performed to localize and mark an adequate pocket of fluid in the right chest. The area was then prepped and draped in the normal sterile fashion. 1% Lidocaine was used for local anesthesia. Under ultrasound guidance a 19 gauge, 7-cm, Yueh catheter was introduced. Thoracentesis was performed. The catheter was removed and a dressing applied. FINDINGS: A total of approximately 1.2 L of clear yellow fluid was removed. Samples were sent to the laboratory as requested by the clinical team. IMPRESSION: Successful ultrasound guided right thoracentesis yielding 1.2  L of pleural fluid. This exam was performed by Tsosie Billing PA-C, and was supervised and interpreted by Dr. Kathlene Cote. Electronically Signed   By: Aletta Edouard M.D.   On: 05/08/2022 12:10   CT ABDOMEN PELVIS WO CONTRAST  Result Date: 05/08/2022 CLINICAL DATA:  Acute non localized abdominal pain for several days. Anorexia. EXAM: CT ABDOMEN AND PELVIS WITHOUT CONTRAST TECHNIQUE: Multidetector CT imaging of the abdomen and pelvis was performed following the standard protocol without IV contrast. RADIATION DOSE REDUCTION: This exam was performed according to the departmental dose-optimization program which includes automated exposure control, adjustment of the mA and/or kV  according to patient size and/or use of iterative reconstruction technique. COMPARISON:  04/13/2022 FINDINGS: Lower chest: Moderate right pleural effusion, without significant change. Hepatobiliary: Small hepatic cysts remains stable. No new or enlarging liver lesions seen on this unenhanced exam. High attenuation gallbladder sludge again noted, however there is no evidence of cholecystitis or biliary ductal dilatation. Pancreas: No mass or inflammatory process visualized on this unenhanced exam. Spleen:  Within normal limits in size. Adrenals/Urinary tract: No evidence of urolithiasis or hydronephrosis. Unremarkable unopacified urinary bladder. Stomach/Bowel: No evidence of obstruction, inflammatory process, or abnormal fluid collections. Diverticulosis is seen mainly involving the sigmoid colon, however there is no evidence of diverticulitis. Vascular/Lymphatic: No pathologically enlarged lymph nodes identified. No evidence of abdominal aortic aneurysm. Aortic atherosclerotic calcification incidentally noted. Reproductive: No mass or other significant abnormality. Bilateral tubal ligation clips are noted. Other:  None. Musculoskeletal:  No suspicious bone lesions identified. IMPRESSION: No evidence of urolithiasis, hydronephrosis, or other  acute findings. Colonic diverticulosis, without radiographic evidence of diverticulitis. Stable moderate right pleural effusion. Electronically Signed   By: Marlaine Hind M.D.   On: 05/08/2022 09:12   CT HEAD WO CONTRAST (5MM)  Result Date: 05/08/2022 CLINICAL DATA:  Dizziness, persistent/recurrent, cardiac or vascular cause suspected EXAM: CT HEAD WITHOUT CONTRAST TECHNIQUE: Contiguous axial images were obtained from the base of the skull through the vertex without intravenous contrast. RADIATION DOSE REDUCTION: This exam was performed according to the departmental dose-optimization program which includes automated exposure control, adjustment of the mA and/or kV according to patient size and/or use of iterative reconstruction technique. COMPARISON:  01/29/2022 FINDINGS: Brain: No evidence of acute infarction, hemorrhage, hydrocephalus, extra-axial collection or mass lesion/mass effect. Scattered low-density changes within the periventricular and subcortical white matter compatible with chronic microvascular ischemic change. Mild diffuse cerebral volume loss. Vascular: Atherosclerotic calcifications involving the large vessels of the skull base. No unexpected hyperdense vessel. Skull: Normal. Negative for fracture or focal lesion. Sinuses/Orbits: No acute finding. Other: None. IMPRESSION: 1. No acute intracranial abnormality. 2. Mild chronic microvascular ischemic change and cerebral volume loss. Electronically Signed   By: Davina Poke D.O.   On: 05/08/2022 09:10   DG Chest Portable 1 View  Result Date: 05/08/2022 CLINICAL DATA:  Shortness of breath EXAM: PORTABLE CHEST 1 VIEW COMPARISON:  Chest x-ray dated April 30, 2021 FINDINGS: Cardiac and mediastinal contours are unchanged. Background emphysema. Stable right perihilar and right upper lobe nodular opacities. No new parenchymal opacity. Stable small right pleural effusion. No evidence of pneumothorax. IMPRESSION: Stable small right pleural effusion  and right lung nodular opacities. Electronically Signed   By: Yetta Glassman M.D.   On: 05/08/2022 08:34   DG Chest 2 View  Result Date: 04/30/2022 CLINICAL DATA:  Chest pressure. EXAM: CHEST - 2 VIEW COMPARISON:  Chest radiograph dated 04/13/2022. FINDINGS: Slight improvement of vascular congestion since the prior radiograph. Similar appearance of right pleural effusion and right lung base atelectasis. Pneumonia is not excluded. The left lung is clear. No pneumothorax. Mild cardiomegaly. Atherosclerotic calcification of the aorta. No acute osseous pathology. IMPRESSION: 1. Slight improvement of vascular congestion. 2. Similar appearance of right pleural effusion and right lung base atelectasis. Electronically Signed   By: Anner Crete M.D.   On: 04/30/2022 01:20   CT ABDOMEN PELVIS W CONTRAST  Result Date: 04/13/2022 CLINICAL DATA:  Abdominal pain EXAM: CT ABDOMEN AND PELVIS WITH CONTRAST TECHNIQUE: Multidetector CT imaging of the abdomen and pelvis was performed using the standard protocol following bolus administration of intravenous contrast.  RADIATION DOSE REDUCTION: This exam was performed according to the departmental dose-optimization program which includes automated exposure control, adjustment of the mA and/or kV according to patient size and/or use of iterative reconstruction technique. CONTRAST:  62mL OMNIPAQUE IOHEXOL 300 MG/ML  SOLN COMPARISON:  CT 11/26/2021, 02/21/2022 FINDINGS: Lower chest: Persistent moderate-sized layering right-sided pleural effusion with associated right basilar atelectasis. Mild cardiomegaly. Hepatobiliary: Stable small hepatic cysts. No new focal liver abnormality. Mildly decreased attenuation of the hepatic parenchyma. Gallbladder within normal limits. Pancreas: Unremarkable. No pancreatic ductal dilatation or surrounding inflammatory changes. Spleen: Normal in size without focal abnormality. Adrenals/Urinary Tract: Adrenal glands are unremarkable. Kidneys are  within normal limits. Renal calculi, solid lesion, or hydronephrosis. Bladder is unremarkable. Stomach/Bowel: Stomach within normal limits. No dilated loops of bowel. Postsurgical changes of prior right hemicolectomy. Left-sided colonic diverticulosis. No focal bowel wall thickening or inflammatory changes. Vascular/Lymphatic: Extensive atherosclerotic calcification throughout the aortoiliac axis. No aneurysm. No abdominopelvic lymphadenopathy. Reproductive: Uterus and bilateral adnexa are unremarkable. Other: No free fluid. No abdominopelvic fluid collection. No pneumoperitoneum. No abdominal wall hernia. Musculoskeletal: No acute or significant osseous findings. Chronic mild superior endplate compression deformities of L3 and L5. IMPRESSION: 1. No acute abdominopelvic findings. 2. Colonic diverticulosis without evidence of acute diverticulitis. 3. Persistent moderate-sized layering right-sided pleural effusion with associated right basilar atelectasis, similar to prior. 4. Aortic and coronary artery atherosclerosis (ICD10-I70.0). Electronically Signed   By: Davina Poke D.O.   On: 04/13/2022 15:57   DG Chest 2 View  Result Date: 04/13/2022 CLINICAL DATA:  PCP heard crackles and read low oxygen (46% w/out o2) and was concerned for fluid buildup, pt c/o chest pressure x3 days. Pt uses 3 liter's chonically. Pt 93% on 3 liter's currently. EXAM: CHEST - 2 VIEW COMPARISON:  03/09/2022 FINDINGS: Persistent moderate right pleural effusion, with adjacent atelectasis/consolidation right lung base. Coarse right perihilar linear opacities as before. Mild central pulmonary vascular congestion, slightly increased since previous. Heart size upper limits normal. Aortic Atherosclerosis (ICD10-170.0). Old left rib fracture deformities. IMPRESSION: 1. Interval increase in mild pulmonary vascular congestion 2. Persistent right pleural effusion and right lower lung consolidation/atelectasis. Electronically Signed   By: Lucrezia Europe M.D.   On: 04/13/2022 12:09    Microbiology: Results for orders placed or performed during the hospital encounter of 05/08/22  SARS Coronavirus 2 by RT PCR (hospital order, performed in [facility] hospital lab) *cepheid single result test* Anterior Nasal Swab     Status: None   Collection Time: 05/08/22  8:37 AM   Specimen: Anterior Nasal Swab  Result Value Ref Range Status   SARS Coronavirus 2 by RT PCR NEGATIVE NEGATIVE Final    Comment: (NOTE) SARS-CoV-2 target nucleic acids are NOT DETECTED.  The SARS-CoV-2 RNA is generally detectable in upper and lower respiratory specimens during the acute phase of infection. The lowest concentration of SARS-CoV-2 viral copies this assay can detect is 250 copies / mL. A negative result does not preclude SARS-CoV-2 infection and should not be used as the sole basis for treatment or other patient management decisions.  A negative result may occur with improper specimen collection / handling, submission of specimen other than nasopharyngeal swab, presence of viral mutation(s) within the areas targeted by this assay, and inadequate number of viral copies (<250 copies / mL). A negative result must be combined with clinical observations, patient history, and epidemiological information.  Fact Sheet for Patients:   https://www..info/  Fact Sheet for Healthcare Providers: https://hall.com/  This test is not  yet approved or  cleared by the [place] and has been authorized for detection and/or diagnosis of SARS-CoV-2 by FDA under an Emergency Use Authorization (EUA).  This EUA will remain in effect (meaning this test can be used) for the duration of the COVID-19 declaration under Section 564(b)(1) of the Act, 21 U.S.C. section 360bbb-3(b)(1), unless the authorization is terminated or revoked sooner.  Performed at [facility], [address] [place], [place] [address]    Body fluid culture w Gram Stain     Status: None   Collection Time: 05/08/22 12:08 PM   Specimen: PATH Cytology Pleural fluid  Result Value Ref Range Status   Specimen Description   Final    PLEURAL Performed at [facility], [address] [place], [place] [address]    Special Requests   Final    NONE Performed at [facility], [address] [place], [place] [address]    Gram Stain   Final    RARE WBC PRESENT, PREDOMINANTLY MONONUCLEAR NO ORGANISMS SEEN    Culture   Final    NO GROWTH 3 DAYS Performed at [facility], [place] [address] [place],  [address]    Report Status 05/11/2022 FINAL  Final    Labs: CBC: Recent Labs  Lab 05/08/22 0837 05/09/22 0441 05/10/22 0427 05/10/22 0907 05/11/22 0437 05/12/22 0401  WBC 6.5 3.1* 6.3  --  6.8 7.0  HGB 9.3* 8.3* 8.3* 8.3* 8.2* 8.0*  HCT 28.4* 23.9* 24.5* 25.4* 24.7* 24.8*  MCV 96.9 93.4 94.6  --  96.1 96.9  PLT 226 216 220  --  215 379   Basic Metabolic Panel: Recent Labs  Lab 05/08/22 0837 05/09/22 0441 05/10/22 0427 05/11/22 0437 05/12/22 0401  NA 137 136 139 139 136  K 3.6 3.5 3.0* 3.2* 3.8  CL 98 97* 100 101 100  CO2 26 29 31 30 30   GLUCOSE 90 125* 89 86 91  BUN 24* 30* 37* 36* 42*  CREATININE 1.53* 1.74* 1.93* 2.00* 2.07*  CALCIUM 8.7* 8.6* 8.5* 8.5* 8.5*   Liver Function Tests: Recent Labs  Lab 05/08/22 0837  AST 36  ALT 18  ALKPHOS 63  BILITOT 1.1  PROT 7.3  ALBUMIN 3.7   CBG: Recent Labs  Lab 05/08/22 0915  GLUCAP 87    Discharge time spent: greater than 30 minutes.  Signed: Fritzi Mandes, MD [facility] 05/12/2022

## 2022-05-16 ENCOUNTER — Encounter: Payer: Self-pay | Admitting: Family

## 2022-05-16 ENCOUNTER — Ambulatory Visit: Payer: Medicare Other | Attending: Family | Admitting: Family

## 2022-05-16 VITALS — BP 128/79 | HR 74 | Resp 16 | Ht 65.0 in | Wt 113.2 lb

## 2022-05-16 DIAGNOSIS — H538 Other visual disturbances: Secondary | ICD-10-CM | POA: Diagnosis not present

## 2022-05-16 DIAGNOSIS — I4891 Unspecified atrial fibrillation: Secondary | ICD-10-CM | POA: Diagnosis not present

## 2022-05-16 DIAGNOSIS — I10 Essential (primary) hypertension: Secondary | ICD-10-CM

## 2022-05-16 DIAGNOSIS — N184 Chronic kidney disease, stage 4 (severe): Secondary | ICD-10-CM | POA: Insufficient documentation

## 2022-05-16 DIAGNOSIS — I082 Rheumatic disorders of both aortic and tricuspid valves: Secondary | ICD-10-CM | POA: Insufficient documentation

## 2022-05-16 DIAGNOSIS — R519 Headache, unspecified: Secondary | ICD-10-CM | POA: Insufficient documentation

## 2022-05-16 DIAGNOSIS — R0602 Shortness of breath: Secondary | ICD-10-CM | POA: Diagnosis present

## 2022-05-16 DIAGNOSIS — I5042 Chronic combined systolic (congestive) and diastolic (congestive) heart failure: Secondary | ICD-10-CM | POA: Insufficient documentation

## 2022-05-16 DIAGNOSIS — Z87891 Personal history of nicotine dependence: Secondary | ICD-10-CM | POA: Insufficient documentation

## 2022-05-16 DIAGNOSIS — R42 Dizziness and giddiness: Secondary | ICD-10-CM | POA: Diagnosis not present

## 2022-05-16 DIAGNOSIS — Z9981 Dependence on supplemental oxygen: Secondary | ICD-10-CM | POA: Diagnosis not present

## 2022-05-16 DIAGNOSIS — J90 Pleural effusion, not elsewhere classified: Secondary | ICD-10-CM | POA: Insufficient documentation

## 2022-05-16 DIAGNOSIS — E1122 Type 2 diabetes mellitus with diabetic chronic kidney disease: Secondary | ICD-10-CM | POA: Insufficient documentation

## 2022-05-16 DIAGNOSIS — I130 Hypertensive heart and chronic kidney disease with heart failure and stage 1 through stage 4 chronic kidney disease, or unspecified chronic kidney disease: Secondary | ICD-10-CM | POA: Insufficient documentation

## 2022-05-16 DIAGNOSIS — I480 Paroxysmal atrial fibrillation: Secondary | ICD-10-CM | POA: Diagnosis not present

## 2022-05-16 DIAGNOSIS — Z8616 Personal history of COVID-19: Secondary | ICD-10-CM | POA: Insufficient documentation

## 2022-05-16 DIAGNOSIS — I5022 Chronic systolic (congestive) heart failure: Secondary | ICD-10-CM

## 2022-05-16 DIAGNOSIS — J449 Chronic obstructive pulmonary disease, unspecified: Secondary | ICD-10-CM | POA: Diagnosis not present

## 2022-05-16 DIAGNOSIS — F419 Anxiety disorder, unspecified: Secondary | ICD-10-CM | POA: Diagnosis not present

## 2022-05-16 DIAGNOSIS — F32A Depression, unspecified: Secondary | ICD-10-CM | POA: Diagnosis not present

## 2022-05-16 NOTE — Patient Instructions (Addendum)
Continue weighing daily and call for an overnight weight gain of 3 pounds or more or a weekly weight gain of more than 5 pounds.   If you have voicemail, please make sure your mailbox is cleaned out so that we may leave a message and please make sure to listen to any voicemails.    Drink around 30 ounces of fluid daily. Put cups out on the counter to help remind you of how much you need to drink

## 2022-05-16 NOTE — Progress Notes (Signed)
Patient ID: Danielle [name], female    DOB: [birth year], 66 y.o.   MRN: [id number]  HPI  Danielle [name] is a 66 y/o female with a history of breast/ lung/ esophageal cancer, DM, HTN, COPD, anxiety, depression, lymphedema, previous tobacco use and chronic heart failure.   Echo report from 01/30/22 reviewed and showed an EF of 35-40% along with moderate MR, moderate/severe TR and moderate AS. Echo report from 11/07/19 reviewed and showed an EF of 50-55% along with mild TR and moderately elevated PA pressure. Echo report from 04/21/18 reviewed and showed an EF of 35-40% along with mild AS/ trivial AR and moderate MR.   Admitted 05/08/22 due to worsening shortness of breath from her baseline with recurrent moderate right pleural effusion. Had profound epistaxis. Pulmonology/ENT consults obtained. Eliquis was stopped. Had right sided thoracentesis done with removal of 1.2L. Discharged after 4 days. Was in the ED 04/30/22 due to palpitations with AF RVR. IV lopressor given. Was in the ED 04/13/22 due to SOB. Abd/pelvic CT done. Given IV lasix and then released. Admitted 03/09/22 due to GIB. Given IV protonix drip. GI consult obtained. Upper.lower endoscopy done which showed diverticulosis and hemorrhoids. No active bleeding. Eliquis held but then restarted. Discharged after 2 days. Admitted 02/21/22 due to shortness of breath and found to be in AF RVR. Cardiology consult obtained. Successful cardioversion done. Chest CT showed stable right-sided pleural effusion. Underwent thoracentesis, 650 cc was removed. Discharged after 3 days.   She presents today for a follow-up visit with a chief complaint of moderate shortness of breath with minimal exertion. Describes this as chronic in nature. She has associated decreased appetite, fatigue, blurry vision, nosebleeds (now off eliquis), rhinorrhea, headaches, intermittent dizziness and chronic difficulty sleeping along with this. She denies any abdominal distention, palpitations, pedal  edema, chest pain, cough or weight gain.   She has also noticed diarrhea after taking her medications although she doesn't know which medication is causing it. She says that it doesn't last all day though.   Has severe intermittent dizziness that occurs whether she takes her medications or whether she doesn't. Dizziness and headaches occur intermittently.   Past Medical History:  Diagnosis Date   Anemia    Anxiety    Aortic atherosclerosis ([place])    Aortic valve stenosis 02/10/2018   a.) TTE 02/10/2018: EF 55-60%: mild AS with MPG of 12 mmHg. b.) TTE 04/21/2018: EF 35-40%; mild AS with MPG 8 mmHg. c.) TTE 11/07/2019: EF 50-55%; mild AS with MPG 10 mmHg.   Arthritis    Atrial fibrillation (HCC)    a.) CHA2DS2-VASc = 4 (age, sex, HTN, aortic plaque). b.) Rate/rhythm maintained on oral carvedilol; chronically anticoagulated on dose reduced apixaban. c.)  Attempted deployment of LAA occlusive device on 01/11/2021; parameters failed and procedure aborted.   Breast cancer, left ([place]) 2000   a.) T2N1M0; ER/PR (+) --> Tx'd with total mastectomy, LN resection, XRT, and chemotherapy   Cancer of right lung ([place]) 07/30/2016   a.) adenocarcinoma; ALK, ROS1, PDL1, BRAF, EGFR all negative.   Chronic diastolic CHF (congestive heart failure) ([place]) 11/26/2021   CKD (chronic kidney disease), stage IV (HCC)    COPD (chronic obstructive pulmonary disease) (HCC)    Dependence on supplemental oxygen    Depression    Diastolic dysfunction 61/95/0932   a.) TTE 02/10/2017: EF 55-60%; G2DD. b.) TTE 04/21/2018: EF 35-40%; mild LA dilation; mod MV regurgitation. c.) TTE 11/07/2019: EF 50-55%; G1DD.   DOE (dyspnea on exertion)  GERD (gastroesophageal reflux disease)    Heart murmur    History of 2019 novel coronavirus disease (COVID-19) 10/14/2019   HLD (hyperlipidemia)    Hypertension    Long term current use of anticoagulant    a.) apixaban   Lymphedema    Personal history of chemotherapy    Personal  history of radiation therapy    SBO (small bowel obstruction) ([place]) 11/05/2020   Vitamin D deficiency    Past Surgical History:  Procedure Laterality Date   Breast Biospy Left    [facility]   BREAST SURGERY     CARDIOVERSION N/A 02/23/2022   Procedure: CARDIOVERSION;  Surgeon: Corey Skains, MD;  Location: [facility] ORS;  Service: Cardiovascular;  Laterality: N/A;   COLONOSCOPY N/A 04/30/2018   Procedure: COLONOSCOPY;  Surgeon: Virgel Manifold, MD;  Location: [facility] ENDOSCOPY;  Service: Endoscopy;  Laterality: N/A;   COLONOSCOPY N/A 07/22/2018   Procedure: COLONOSCOPY;  Surgeon: Virgel Manifold, MD;  Location: [facility] ENDOSCOPY;  Service: Endoscopy;  Laterality: N/A;   COLONOSCOPY WITH PROPOFOL N/A 09/21/2021   Procedure: COLONOSCOPY WITH PROPOFOL;  Surgeon: Benjamine Sprague, DO;  Location: [facility] ENDOSCOPY;  Service: General;  Laterality: N/A;   COLONOSCOPY WITH PROPOFOL N/A 03/11/2022   Procedure: COLONOSCOPY WITH PROPOFOL;  Surgeon: Annamaria Helling, DO;  Location: [facility] ENDOSCOPY;  Service: Gastroenterology;  Laterality: N/A;   DILATION AND CURETTAGE OF UTERUS     ELECTROMAGNETIC NAVIGATION BROCHOSCOPY Right 04/11/2016   Procedure: ELECTROMAGNETIC NAVIGATION BRONCHOSCOPY;  Surgeon: Vilinda Boehringer, MD;  Location: [facility] ORS;  Service: Cardiopulmonary;  Laterality: Right;   ESOPHAGOGASTRODUODENOSCOPY N/A 07/22/2018   Procedure: ESOPHAGOGASTRODUODENOSCOPY (EGD);  Surgeon: Virgel Manifold, MD;  Location: [facility] ENDOSCOPY;  Service: Endoscopy;  Laterality: N/A;   ESOPHAGOGASTRODUODENOSCOPY (EGD) WITH PROPOFOL N/A 05/07/2018   Procedure: ESOPHAGOGASTRODUODENOSCOPY (EGD) WITH PROPOFOL;  Surgeon: Lucilla Lame, MD;  Location: [facility] ENDOSCOPY;  Service: Endoscopy;  Laterality: N/A;   ESOPHAGOGASTRODUODENOSCOPY (EGD) WITH PROPOFOL N/A 04/24/2019   Procedure: ESOPHAGOGASTRODUODENOSCOPY (EGD) WITH PROPOFOL;  Surgeon: Jonathon Bellows, MD;  Location: [facility] ENDOSCOPY;  Service: Gastroenterology;  Laterality: N/A;    ESOPHAGOGASTRODUODENOSCOPY (EGD) WITH PROPOFOL N/A 01/12/2020   Procedure: ESOPHAGOGASTRODUODENOSCOPY (EGD) WITH PROPOFOL;  Surgeon: Jonathon Bellows, MD;  Location: [facility] ENDOSCOPY;  Service: Gastroenterology;  Laterality: N/A;   ESOPHAGOGASTRODUODENOSCOPY (EGD) WITH PROPOFOL N/A 04/28/2020   Procedure: ESOPHAGOGASTRODUODENOSCOPY (EGD) WITH PROPOFOL;  Surgeon: Jonathon Bellows, MD;  Location: [facility] ENDOSCOPY;  Service: Gastroenterology;  Laterality: N/A;   ESOPHAGOGASTRODUODENOSCOPY (EGD) WITH PROPOFOL N/A 03/11/2022   Procedure: ESOPHAGOGASTRODUODENOSCOPY (EGD) WITH PROPOFOL;  Surgeon: Annamaria Helling, DO;  Location: [facility] ENDOSCOPY;  Service: Gastroenterology;  Laterality: N/A;   EUS N/A 05/07/2019   Procedure: FULL UPPER ENDOSCOPIC ULTRASOUND (EUS) RADIAL;  Surgeon: Jola Schmidt, MD;  Location: [facility] ENDOSCOPY;  Service: Endoscopy;  Laterality: N/A;   ILEOSCOPY N/A 07/22/2018   Procedure: ILEOSCOPY THROUGH STOMA;  Surgeon: Virgel Manifold, MD;  Location: [facility] ENDOSCOPY;  Service: Endoscopy;  Laterality: N/A;   ILEOSTOMY     ILEOSTOMY N/A 09/08/2018   Procedure: ILEOSTOMY REVISION POSSIBLE CREATION;  Surgeon: Herbert Pun, MD;  Location: [facility] ORS;  Service: General;  Laterality: N/A;   ILEOSTOMY CLOSURE N/A 08/15/2018   Procedure: DILATION OF ILEOSTOMY STRICTURE;  Surgeon: Herbert Pun, MD;  Location: [facility] ORS;  Service: General;  Laterality: N/A;   LAPAROTOMY Right 05/04/2018   Procedure: EXPLORATORY LAPAROTOMY right colectomy right and left ostomy;  Surgeon: Herbert Pun, MD;  Location: [facility] ORS;  Service: General;  Laterality: Right;   LEFT ATRIAL APPENDAGE OCCLUSION  N/A 01/11/2021   Procedure: LEFT ATRIAL APPENDAGE OCCLUSION ([place]); ABORTED PROCEDURE WITHOUT DEVICE BEING IMPLANTED; Location: [facility]; Surgeon: Mylinda Latina, MD   LUNG BIOPSY     MASTECTOMY Left    2000, [place] Right    [place]   XI ROBOTIC ASSISTED COLOSTOMY TAKEDOWN  N/A 10/23/2021   Procedure: XI ROBOTIC ASSISTED ILEOSTOMY TAKEDOWN;  Surgeon: Herbert Pun, MD;  Location: [facility] ORS;  Service: General;  Laterality: N/A;  180 minutes for the surgery part please   Family History  Problem Relation Age of Onset   Breast cancer Mother 54   Cancer Mother        Breast    Cirrhosis Father    Breast cancer Paternal Aunt 70   Cancer Maternal Aunt        Breast    Social History   Tobacco Use   Smoking status: Former    Packs/day: 0.50    Years: 20.00    Total pack years: 10.00    Types: Cigarettes    Quit date: 07/02/2012    Years since quitting: 9.8   Smokeless tobacco: Current    Types: Snuff  Substance Use Topics   Alcohol use: Yes    Alcohol/week: 3.0 standard drinks of alcohol    Types: 3 Cans of beer per week    Comment: Occasionally beer   No Known Allergies  Prior to Admission medications   Medication Sig Start Date End Date Taking? Authorizing Provider  acetaminophen (TYLENOL) 325 MG tablet Take 2 tablets (650 mg total) by mouth every 6 (six) hours as needed for mild pain (or Fever >/= 101). Patient taking differently: Take 650 mg by mouth every 4 (four) hours as needed for moderate pain. 12/24/18  Yes Gouru, Illene Silver, MD  albuterol (PROVENTIL HFA;VENTOLIN HFA) 108 (90 Base) MCG/ACT inhaler Inhale 2 puffs into the lungs every 6 (six) hours as needed for wheezing or shortness of breath. 04/24/18  Yes Demetrios Loll, MD  amiodarone (PACERONE) 200 MG tablet Take 1 tablet (200 mg total) by mouth 2 (two) times daily. 11/24/21  Yes Herbert Pun, MD  atorvastatin (LIPITOR) 40 MG tablet Take 1 tablet (40 mg total) by mouth daily. 02/03/22  Yes Nicole Kindred A, DO  calcium carbonate (TUMS - DOSED IN MG ELEMENTAL CALCIUM) 500 MG chewable tablet Chew 1 tablet by mouth 2 (two) times daily as needed.   Yes [provider]  citalopram (CELEXA) 20 MG tablet Take 1 tablet (20 mg total) by mouth daily. 02/04/22  Yes Nicole Kindred A, DO   ferrous sulfate 325 (65 FE) MG tablet Take 325 mg by mouth daily with breakfast.   Yes [provider]  Fluticasone-Umeclidin-Vilant (TRELEGY ELLIPTA) 100-62.5-25 MCG/ACT AEPB Inhale 1 puff into the lungs daily. 03/05/22  Yes [provider]  metoprolol tartrate (LOPRESSOR) 25 MG tablet Take 1 tablet (25 mg total) by mouth 2 (two) times daily. 02/03/22  Yes Ezekiel Slocumb, DO  mupirocin ointment (BACTROBAN) 2 % Place into the nose 2 (two) times daily. 05/12/22  Yes Fritzi Mandes, MD  OXYGEN Inhale 3 L into the lungs as needed (shortness of breath).   Yes [provider]  oxymetazoline (AFRIN) 0.05 % nasal spray Place 1 spray into both nostrils 2 (two) times daily.   Yes [provider]  pantoprazole (PROTONIX) 40 MG tablet Take 1 tablet (40 mg total) by mouth 2 (two) times daily before a meal. 08/05/20  Yes Pokhrel, Corrie Mckusick, MD  sodium  bicarbonate 650 MG tablet Take 1,300 mg by mouth daily.   Yes [provider]  torsemide 40 MG TABS Take 40 mg by mouth daily. 02/04/22  Yes Nicole Kindred A, DO  zolpidem (AMBIEN) 5 MG tablet Take 5 mg by mouth at bedtime as needed for sleep. 06/23/21  Yes [provider]   Review of Systems  Constitutional:  Positive for appetite change (decreased) and fatigue (easily).  HENT:  Positive for nosebleeds (at times) and rhinorrhea. Negative for congestion.   Eyes:  Positive for visual disturbance (blurry vision).  Respiratory:  Positive for shortness of breath (easily). Negative for cough.   Cardiovascular:  Negative for chest pain, palpitations and leg swelling.  Gastrointestinal:  Positive for diarrhea. Negative for abdominal distention and abdominal pain.  Musculoskeletal:  Negative for back pain and neck pain.  Skin: Negative.   Neurological:  Positive for dizziness (intermittent) and headaches (intermittent). Negative for light-headedness.  Hematological:  Negative for adenopathy. Does not bruise/bleed easily.   Psychiatric/Behavioral:  Positive for sleep disturbance (chronic; sleeping on 1 pillow with oxygen). Negative for dysphoric mood. The patient is not nervous/anxious.    Vitals:   05/16/22 1326  BP: 128/79  Pulse: 74  Resp: 16  SpO2: 98%  Weight: 113 lb 4 oz (51.4 kg)  Height: $Remove'5\' 5"'HINzUWv$  (1.651 m)   Wt Readings from Last 3 Encounters:  05/16/22 113 lb 4 oz (51.4 kg)  05/08/22 114 lb 6.7 oz (51.9 kg)  04/30/22 113 lb (51.3 kg)   Lab Results  Component Value Date   CREATININE 2.07 (H) 05/12/2022   CREATININE 2.00 (H) 05/11/2022   CREATININE 1.93 (H) 05/10/2022   Physical Exam Vitals and nursing note reviewed.  Constitutional:      Appearance: She is well-developed.  HENT:     Head: Normocephalic and atraumatic.  Neck:     Vascular: No JVD.  Cardiovascular:     Rate and Rhythm: Regular rhythm.  Pulmonary:     Effort: Pulmonary effort is normal. No respiratory distress.     Breath sounds: No wheezing or rales.  Abdominal:     General: There is no distension.     Palpations: Abdomen is soft.  Musculoskeletal:        General: No tenderness.     Cervical back: Normal range of motion and neck supple.     Right lower leg: No tenderness. Edema (trace pitting) present.     Left lower leg: No tenderness. Edema (trace pitting) present.  Skin:    General: Skin is warm and dry.  Neurological:     Mental Status: She is alert and oriented to person, place, and time.  Psychiatric:        Behavior: Behavior normal.    Assessment & Plan:  1: Chronic heart failure with reduced ejection fraction- - NYHA class III - euvolemic today - weighing daily; reminded to call for an overnight weight gain of >2 pounds or a weekly weight gain of >5 pounds - weight stable from last visit here 1 month ago - on GDMT of metoprolol (although tartrate) - consider SGLT2 if renal function remains good; renal function will not allow for MRA/entresto - drinks 36 ounces of beer over the weekends -  encouraged to keep daily fluid intake to around 30 ounces; per nephrology  - not adding salt but does eat out often; reviewed how to make low sodium choices when eating out - BNP 02/21/22 was 1059.9  2: HTN- - BP looks good (  128/79) and she hasn't taken her medications yet today - sees PCP (@ [facility]) 05/24/22 - BMP reviewed 04/30/22 and showed sodium 133, potassium 3.6, creatinine 2.14 and GFR 25 - saw nephrology Holley Raring) 04/19/22  3: Atrial fibrillation- - saw cardiology Petra Kuba) 04/13/22; returns 05/22/22 - previous successful cardioversion  4: COPD- - saw pulmonology Raul Del) 05/02/22; returns 05/30/22 - using trelegy and albuterol - wearing oxygen at 3L around the clock   Medication list reviewed.   Return in 2 months, sooner if needed.

## 2022-05-22 ENCOUNTER — Inpatient Hospital Stay
Admission: EM | Admit: 2022-05-22 | Discharge: 2022-05-26 | DRG: 308 | Disposition: A | Discharge status: Home / Self Care | Payer: Medicare Other | Attending: Internal Medicine | Admitting: Internal Medicine

## 2022-05-22 ENCOUNTER — Emergency Department: Payer: Medicare Other

## 2022-05-22 ENCOUNTER — Other Ambulatory Visit: Payer: Self-pay

## 2022-05-22 DIAGNOSIS — Z681 Body mass index (BMI) 19 or less, adult: Secondary | ICD-10-CM | POA: Diagnosis not present

## 2022-05-22 DIAGNOSIS — Z9981 Dependence on supplemental oxygen: Secondary | ICD-10-CM

## 2022-05-22 DIAGNOSIS — F32A Depression, unspecified: Secondary | ICD-10-CM | POA: Diagnosis present

## 2022-05-22 DIAGNOSIS — N184 Chronic kidney disease, stage 4 (severe): Secondary | ICD-10-CM | POA: Diagnosis present

## 2022-05-22 DIAGNOSIS — I700 Atherosclerosis of aorta: Secondary | ICD-10-CM | POA: Diagnosis present

## 2022-05-22 DIAGNOSIS — I130 Hypertensive heart and chronic kidney disease with heart failure and stage 1 through stage 4 chronic kidney disease, or unspecified chronic kidney disease: Secondary | ICD-10-CM | POA: Diagnosis present

## 2022-05-22 DIAGNOSIS — Z8616 Personal history of COVID-19: Secondary | ICD-10-CM | POA: Diagnosis not present

## 2022-05-22 DIAGNOSIS — J9621 Acute and chronic respiratory failure with hypoxia: Secondary | ICD-10-CM | POA: Diagnosis present

## 2022-05-22 DIAGNOSIS — N179 Acute kidney failure, unspecified: Secondary | ICD-10-CM | POA: Diagnosis present

## 2022-05-22 DIAGNOSIS — R0602 Shortness of breath: Secondary | ICD-10-CM | POA: Diagnosis not present

## 2022-05-22 DIAGNOSIS — J90 Pleural effusion, not elsewhere classified: Secondary | ICD-10-CM | POA: Diagnosis not present

## 2022-05-22 DIAGNOSIS — M1990 Unspecified osteoarthritis, unspecified site: Secondary | ICD-10-CM | POA: Diagnosis present

## 2022-05-22 DIAGNOSIS — I5043 Acute on chronic combined systolic (congestive) and diastolic (congestive) heart failure: Secondary | ICD-10-CM | POA: Diagnosis present

## 2022-05-22 DIAGNOSIS — E559 Vitamin D deficiency, unspecified: Secondary | ICD-10-CM | POA: Diagnosis present

## 2022-05-22 DIAGNOSIS — I4819 Other persistent atrial fibrillation: Secondary | ICD-10-CM | POA: Diagnosis present

## 2022-05-22 DIAGNOSIS — I083 Combined rheumatic disorders of mitral, aortic and tricuspid valves: Secondary | ICD-10-CM | POA: Diagnosis present

## 2022-05-22 DIAGNOSIS — Z9012 Acquired absence of left breast and nipple: Secondary | ICD-10-CM | POA: Diagnosis not present

## 2022-05-22 DIAGNOSIS — Z85118 Personal history of other malignant neoplasm of bronchus and lung: Secondary | ICD-10-CM

## 2022-05-22 DIAGNOSIS — F419 Anxiety disorder, unspecified: Secondary | ICD-10-CM | POA: Diagnosis present

## 2022-05-22 DIAGNOSIS — Z8501 Personal history of malignant neoplasm of esophagus: Secondary | ICD-10-CM

## 2022-05-22 DIAGNOSIS — Z79899 Other long term (current) drug therapy: Secondary | ICD-10-CM

## 2022-05-22 DIAGNOSIS — Z803 Family history of malignant neoplasm of breast: Secondary | ICD-10-CM

## 2022-05-22 DIAGNOSIS — I5023 Acute on chronic systolic (congestive) heart failure: Secondary | ICD-10-CM | POA: Diagnosis present

## 2022-05-22 DIAGNOSIS — D631 Anemia in chronic kidney disease: Secondary | ICD-10-CM | POA: Diagnosis present

## 2022-05-22 DIAGNOSIS — J439 Emphysema, unspecified: Secondary | ICD-10-CM | POA: Diagnosis present

## 2022-05-22 DIAGNOSIS — R0600 Dyspnea, unspecified: Secondary | ICD-10-CM | POA: Diagnosis present

## 2022-05-22 DIAGNOSIS — D472 Monoclonal gammopathy: Secondary | ICD-10-CM | POA: Diagnosis present

## 2022-05-22 DIAGNOSIS — Z20822 Contact with and (suspected) exposure to covid-19: Secondary | ICD-10-CM | POA: Diagnosis present

## 2022-05-22 DIAGNOSIS — Z7951 Long term (current) use of inhaled steroids: Secondary | ICD-10-CM

## 2022-05-22 DIAGNOSIS — E43 Unspecified severe protein-calorie malnutrition: Secondary | ICD-10-CM | POA: Diagnosis present

## 2022-05-22 DIAGNOSIS — I480 Paroxysmal atrial fibrillation: Secondary | ICD-10-CM | POA: Diagnosis present

## 2022-05-22 DIAGNOSIS — E785 Hyperlipidemia, unspecified: Secondary | ICD-10-CM | POA: Diagnosis present

## 2022-05-22 DIAGNOSIS — K219 Gastro-esophageal reflux disease without esophagitis: Secondary | ICD-10-CM | POA: Diagnosis present

## 2022-05-22 DIAGNOSIS — Z853 Personal history of malignant neoplasm of breast: Secondary | ICD-10-CM

## 2022-05-22 DIAGNOSIS — Z923 Personal history of irradiation: Secondary | ICD-10-CM

## 2022-05-22 DIAGNOSIS — Z9049 Acquired absence of other specified parts of digestive tract: Secondary | ICD-10-CM

## 2022-05-22 DIAGNOSIS — N189 Chronic kidney disease, unspecified: Secondary | ICD-10-CM | POA: Diagnosis not present

## 2022-05-22 DIAGNOSIS — Z720 Tobacco use: Secondary | ICD-10-CM

## 2022-05-22 DIAGNOSIS — I10 Essential (primary) hypertension: Secondary | ICD-10-CM | POA: Diagnosis present

## 2022-05-22 DIAGNOSIS — Z9221 Personal history of antineoplastic chemotherapy: Secondary | ICD-10-CM

## 2022-05-22 LAB — CBC WITH DIFFERENTIAL/PLATELET
Abs Immature Granulocytes: 0.02 10*3/uL (ref 0.00–0.07)
Basophils Absolute: 0.1 10*3/uL (ref 0.0–0.1)
Basophils Relative: 1 %
Eosinophils Absolute: 0 10*3/uL (ref 0.0–0.5)
Eosinophils Relative: 0 %
HCT: 30.1 % — ABNORMAL LOW (ref 36.0–46.0)
Hemoglobin: 9.7 g/dL — ABNORMAL LOW (ref 12.0–15.0)
Immature Granulocytes: 0 %
Lymphocytes Relative: 13 %
Lymphs Abs: 1 10*3/uL (ref 0.7–4.0)
MCH: 31.9 pg (ref 26.0–34.0)
MCHC: 32.2 g/dL (ref 30.0–36.0)
MCV: 99 fL (ref 80.0–100.0)
Monocytes Absolute: 0.4 10*3/uL (ref 0.1–1.0)
Monocytes Relative: 5 %
Neutro Abs: 6.2 10*3/uL (ref 1.7–7.7)
Neutrophils Relative %: 81 %
Platelets: 321 10*3/uL (ref 150–400)
RBC: 3.04 MIL/uL — ABNORMAL LOW (ref 3.87–5.11)
RDW: 15 % (ref 11.5–15.5)
WBC: 7.7 10*3/uL (ref 4.0–10.5)
nRBC: 0.5 % — ABNORMAL HIGH (ref 0.0–0.2)

## 2022-05-22 LAB — COMPREHENSIVE METABOLIC PANEL
ALT: 29 U/L (ref 0–44)
AST: 58 U/L — ABNORMAL HIGH (ref 15–41)
Albumin: 4.3 g/dL (ref 3.5–5.0)
Alkaline Phosphatase: 73 U/L (ref 38–126)
Anion gap: 18 — ABNORMAL HIGH (ref 5–15)
BUN: 37 mg/dL — ABNORMAL HIGH (ref 8–23)
CO2: 20 mmol/L — ABNORMAL LOW (ref 22–32)
Calcium: 9.8 mg/dL (ref 8.9–10.3)
Chloride: 100 mmol/L (ref 98–111)
Creatinine, Ser: 2.29 mg/dL — ABNORMAL HIGH (ref 0.44–1.00)
GFR, Estimated: 23 mL/min — ABNORMAL LOW (ref >60)
Glucose, Bld: 62 mg/dL — ABNORMAL LOW (ref 70–99)
Potassium: 4.1 mmol/L (ref 3.5–5.1)
Sodium: 138 mmol/L (ref 135–145)
Total Bilirubin: 1.4 mg/dL — ABNORMAL HIGH (ref 0.3–1.2)
Total Protein: 8.5 g/dL — ABNORMAL HIGH (ref 6.5–8.1)

## 2022-05-22 LAB — TROPONIN I (HIGH SENSITIVITY)
Troponin I (High Sensitivity): 37 ng/L — ABNORMAL HIGH (ref <18)
Troponin I (High Sensitivity): 45 ng/L — ABNORMAL HIGH (ref <18)

## 2022-05-22 LAB — SARS CORONAVIRUS 2 BY RT PCR: SARS Coronavirus 2 by RT PCR: NEGATIVE

## 2022-05-22 LAB — BRAIN NATRIURETIC PEPTIDE: B Natriuretic Peptide: 1925.4 pg/mL — ABNORMAL HIGH (ref 0.0–100.0)

## 2022-05-22 MED ORDER — HYDRALAZINE HCL 20 MG/ML IJ SOLN: 5.0000 mg | INTRAMUSCULAR | Status: DC | PRN

## 2022-05-22 MED ORDER — UMECLIDINIUM BROMIDE 62.5 MCG/ACT IN AEPB
1.0000 | INHALATION_SPRAY | Freq: Every day | RESPIRATORY_TRACT | Status: DC
  Administered 2022-05-24 – 2022-05-26 (×3): 1 via RESPIRATORY_TRACT
  Filled 2022-05-22: qty 7

## 2022-05-22 MED ORDER — FUROSEMIDE 10 MG/ML IJ SOLN
40.0000 mg | Freq: Two times a day (BID) | INTRAMUSCULAR | Status: AC
  Administered 2022-05-22 – 2022-05-23 (×2): 40 mg via INTRAVENOUS
  Filled 2022-05-22 (×2): qty 4

## 2022-05-22 MED ORDER — CITALOPRAM HYDROBROMIDE 20 MG PO TABS
20.0000 mg | ORAL_TABLET | Freq: Every day | ORAL | Status: DC
  Administered 2022-05-22 – 2022-05-26 (×5): 20 mg via ORAL
  Filled 2022-05-22 (×5): qty 1

## 2022-05-22 MED ORDER — DEXTROSE 50 % IV SOLN
1.0000 | Freq: Once | INTRAVENOUS | Status: DC
  Filled 2022-05-22: qty 50

## 2022-05-22 MED ORDER — ONDANSETRON HCL 4 MG PO TABS: 4.0000 mg | ORAL_TABLET | Freq: Four times a day (QID) | ORAL | Status: DC | PRN

## 2022-05-22 MED ORDER — METOPROLOL TARTRATE 25 MG PO TABS
25.0000 mg | ORAL_TABLET | Freq: Two times a day (BID) | ORAL | Status: DC
  Administered 2022-05-22 – 2022-05-26 (×8): 25 mg via ORAL
  Filled 2022-05-22 (×8): qty 1

## 2022-05-22 MED ORDER — ALBUTEROL SULFATE (2.5 MG/3ML) 0.083% IN NEBU
3.0000 mL | INHALATION_SOLUTION | Freq: Four times a day (QID) | RESPIRATORY_TRACT | Status: DC | PRN
  Administered 2022-05-23: 3 mL via RESPIRATORY_TRACT
  Filled 2022-05-22: qty 3

## 2022-05-22 MED ORDER — AMIODARONE HCL 200 MG PO TABS
200.0000 mg | ORAL_TABLET | Freq: Two times a day (BID) | ORAL | Status: DC
  Administered 2022-05-22: 200 mg via ORAL
  Filled 2022-05-22 (×2): qty 1

## 2022-05-22 MED ORDER — ONDANSETRON HCL 4 MG/2ML IJ SOLN: 4.0000 mg | Freq: Four times a day (QID) | INTRAMUSCULAR | Status: DC | PRN

## 2022-05-22 MED ORDER — HEPARIN SODIUM (PORCINE) 5000 UNIT/ML IJ SOLN
5000.0000 [IU] | Freq: Three times a day (TID) | INTRAMUSCULAR | Status: DC
Start: 2022-05-22 — End: 2022-05-24
  Administered 2022-05-22 – 2022-05-24 (×4): 5000 [IU] via SUBCUTANEOUS
  Filled 2022-05-22 (×5): qty 1

## 2022-05-22 MED ORDER — ATORVASTATIN CALCIUM 20 MG PO TABS
40.0000 mg | ORAL_TABLET | Freq: Every day | ORAL | Status: DC
  Administered 2022-05-22 – 2022-05-26 (×5): 40 mg via ORAL
  Filled 2022-05-22 (×5): qty 2

## 2022-05-22 MED ORDER — SODIUM CHLORIDE 0.9% FLUSH
3.0000 mL | Freq: Two times a day (BID) | INTRAVENOUS | Status: DC
  Administered 2022-05-23 – 2022-05-26 (×7): 3 mL via INTRAVENOUS

## 2022-05-22 MED ORDER — FLUTICASONE FUROATE-VILANTEROL 100-25 MCG/ACT IN AEPB
1.0000 | INHALATION_SPRAY | Freq: Every day | RESPIRATORY_TRACT | Status: DC
  Administered 2022-05-24 – 2022-05-26 (×3): 1 via RESPIRATORY_TRACT
  Filled 2022-05-22: qty 28

## 2022-05-22 MED ORDER — ACETAMINOPHEN 500 MG PO TABS
1000.0000 mg | ORAL_TABLET | Freq: Once | ORAL | Status: AC
Start: 2022-05-22 — End: 2022-05-22
  Administered 2022-05-22: 1000 mg via ORAL
  Filled 2022-05-22: qty 2

## 2022-05-22 MED ORDER — ACETAMINOPHEN 650 MG RE SUPP: 650.0000 mg | Freq: Four times a day (QID) | RECTAL | Status: DC | PRN

## 2022-05-22 MED ORDER — ACETAMINOPHEN 325 MG PO TABS
650.0000 mg | ORAL_TABLET | Freq: Four times a day (QID) | ORAL | Status: DC | PRN
  Administered 2022-05-22 – 2022-05-26 (×7): 650 mg via ORAL
  Filled 2022-05-22 (×6): qty 2

## 2022-05-22 NOTE — Procedures (Signed)
PROCEDURE SUMMARY:  Successful US guided therapeutic right thoracentesis. Yielded 900 cc of clear, amber fluid. Pt tolerated procedure well. No immediate complications.  Specimen not sent for labs. CXR ordered.  EBL < 1 mL  Tyson Alias, AGNP 05/22/2022 4:56 PM

## 2022-05-22 NOTE — Assessment & Plan Note (Signed)
Lab Results  Component Value Date   CREATININE 2.29 (H) 05/22/2022   CREATININE 2.07 (H) 05/12/2022   CREATININE 2.00 (H) 05/11/2022  Mild worsening of kidney function attribute to patient's acute congestive heart failure presentation. We will avoid any contrast and renally dose all needed medications.

## 2022-05-22 NOTE — H&P (Signed)
History and Physical    Danielle [name] HQP:[id number] DOB: 12-Jun-1956 DOA: 05/22/2022  PCP: [name], [place]   Cardiology: Dr. [name] Pulmonology: Dr. Raul Del.  Patient coming from:  Home    Chief Complaint:  SOB Acute on chronic respiratory failure.   HPI:  Danielle [name] is a 66 y.o. female seen in ed with complaints of SOB on her baseline 4L [place]. Pt  has h/o lung cancer and esophageal cancer and chf. She had pleural effusion and was said to be transudative and f/u with dr Raul Del and dr. Erin Fulling for cardiology.She also has h/o a.fib and on eliquis and had recent admission fro epistaxis and was discharged less than one week ago.  Pt has past medical history as below of anemia/a/fib/ chf etc.   ED Course:   Vitals:   05/22/22 1730 05/22/22 1823 05/22/22 2000 05/22/22 2030  BP:   102/63 108/70  Pulse: 100 (!) 110 (!) 127 (!) 111  Resp: (!) $RemoveB'22 18 17 16  'JWbhvJcb$ Temp:      TempSrc:      SpO2: 98% 99% 92% 92%  Weight:      Height:      Upon coming to hospital and being tachypneic and hypoxic EDMD got thoracentesis done and had : Successful US guided therapeutic right thoracentesis. Yielded 900 cc of clear, amber fluid. Pt tolerated procedure well. No immediate complications. Labs shows mild AKI on CKD  abnormal LFT Elevated BNP 1925.4 and TNI of 37. Anemia with hb of 9.7 and normal wbc/ plt.   Review of Systems:  Review of Systems  Respiratory:  Positive for shortness of breath.   Cardiovascular:  Positive for leg swelling.   Past Medical History:  Diagnosis Date   Acute respiratory failure due to COVID-19 ([place]) 09/27/2019   Anemia    Anxiety    Aortic atherosclerosis ([place])    Aortic valve stenosis 02/10/2018   a.) TTE 02/10/2018: EF 55-60%: mild AS with MPG of 12 mmHg. b.) TTE 04/21/2018: EF 35-40%; mild AS with MPG 8 mmHg. c.) TTE 11/07/2019: EF 50-55%; mild AS with MPG 10 mmHg.   Arthritis    Atrial fibrillation (HCC)    a.) CHA2DS2-VASc = 4 (age, sex, HTN,  aortic plaque). b.) Rate/rhythm maintained on oral carvedilol; chronically anticoagulated on dose reduced apixaban. c.)  Attempted deployment of LAA occlusive device on 01/11/2021; parameters failed and procedure aborted.   Breast cancer, left ([place]) 2000   a.) T2N1M0; ER/PR (+) --> Tx'd with total mastectomy, LN resection, XRT, and chemotherapy   Cancer of right lung ([place]) 07/30/2016   a.) adenocarcinoma; ALK, ROS1, PDL1, BRAF, EGFR all negative.   Chronic diastolic CHF (congestive heart failure) ([place]) 11/26/2021   CKD (chronic kidney disease), stage IV (HCC)    COPD (chronic obstructive pulmonary disease) (HCC)    Dependence on supplemental oxygen    Depression    Diastolic dysfunction 59/93/5701   a.) TTE 02/10/2017: EF 55-60%; G2DD. b.) TTE 04/21/2018: EF 35-40%; mild LA dilation; mod MV regurgitation. c.) TTE 11/07/2019: EF 50-55%; G1DD.   DOE (dyspnea on exertion)    GERD (gastroesophageal reflux disease)    Heart murmur    History of 2019 novel coronavirus disease (COVID-19) 10/14/2019   HLD (hyperlipidemia)    Hypertension    Long term current use of anticoagulant    a.) apixaban   Lymphedema    Personal history of chemotherapy    Personal history of radiation therapy    Respiratory tract infection due  to COVID-19 virus 09/27/2019   SBO (small bowel obstruction) ([place]) 11/05/2020   Vitamin D deficiency     Past Surgical History:  Procedure Laterality Date   Breast Biospy Left    [facility]   BREAST SURGERY     CARDIOVERSION N/A 02/23/2022   Procedure: CARDIOVERSION;  Surgeon: Corey Skains, MD;  Location: [facility] ORS;  Service: Cardiovascular;  Laterality: N/A;   COLONOSCOPY N/A 04/30/2018   Procedure: COLONOSCOPY;  Surgeon: Virgel Manifold, MD;  Location: [facility] ENDOSCOPY;  Service: Endoscopy;  Laterality: N/A;   COLONOSCOPY N/A 07/22/2018   Procedure: COLONOSCOPY;  Surgeon: Virgel Manifold, MD;  Location: [facility] ENDOSCOPY;  Service: Endoscopy;  Laterality: N/A;    COLONOSCOPY WITH PROPOFOL N/A 09/21/2021   Procedure: COLONOSCOPY WITH PROPOFOL;  Surgeon: Benjamine Sprague, DO;  Location: [facility] ENDOSCOPY;  Service: General;  Laterality: N/A;   COLONOSCOPY WITH PROPOFOL N/A 03/11/2022   Procedure: COLONOSCOPY WITH PROPOFOL;  Surgeon: Annamaria Helling, DO;  Location: [facility] ENDOSCOPY;  Service: Gastroenterology;  Laterality: N/A;   DILATION AND CURETTAGE OF UTERUS     ELECTROMAGNETIC NAVIGATION BROCHOSCOPY Right 04/11/2016   Procedure: ELECTROMAGNETIC NAVIGATION BRONCHOSCOPY;  Surgeon: Vilinda Boehringer, MD;  Location: [facility] ORS;  Service: Cardiopulmonary;  Laterality: Right;   ESOPHAGOGASTRODUODENOSCOPY N/A 07/22/2018   Procedure: ESOPHAGOGASTRODUODENOSCOPY (EGD);  Surgeon: Virgel Manifold, MD;  Location: [facility] ENDOSCOPY;  Service: Endoscopy;  Laterality: N/A;   ESOPHAGOGASTRODUODENOSCOPY (EGD) WITH PROPOFOL N/A 05/07/2018   Procedure: ESOPHAGOGASTRODUODENOSCOPY (EGD) WITH PROPOFOL;  Surgeon: Lucilla Lame, MD;  Location: [facility] ENDOSCOPY;  Service: Endoscopy;  Laterality: N/A;   ESOPHAGOGASTRODUODENOSCOPY (EGD) WITH PROPOFOL N/A 04/24/2019   Procedure: ESOPHAGOGASTRODUODENOSCOPY (EGD) WITH PROPOFOL;  Surgeon: Jonathon Bellows, MD;  Location: [facility] ENDOSCOPY;  Service: Gastroenterology;  Laterality: N/A;   ESOPHAGOGASTRODUODENOSCOPY (EGD) WITH PROPOFOL N/A 01/12/2020   Procedure: ESOPHAGOGASTRODUODENOSCOPY (EGD) WITH PROPOFOL;  Surgeon: Jonathon Bellows, MD;  Location: [facility] ENDOSCOPY;  Service: Gastroenterology;  Laterality: N/A;   ESOPHAGOGASTRODUODENOSCOPY (EGD) WITH PROPOFOL N/A 04/28/2020   Procedure: ESOPHAGOGASTRODUODENOSCOPY (EGD) WITH PROPOFOL;  Surgeon: Jonathon Bellows, MD;  Location: [facility] ENDOSCOPY;  Service: Gastroenterology;  Laterality: N/A;   ESOPHAGOGASTRODUODENOSCOPY (EGD) WITH PROPOFOL N/A 03/11/2022   Procedure: ESOPHAGOGASTRODUODENOSCOPY (EGD) WITH PROPOFOL;  Surgeon: Annamaria Helling, DO;  Location: [facility] ENDOSCOPY;  Service: Gastroenterology;  Laterality: N/A;    EUS N/A 05/07/2019   Procedure: FULL UPPER ENDOSCOPIC ULTRASOUND (EUS) RADIAL;  Surgeon: Jola Schmidt, MD;  Location: [facility] ENDOSCOPY;  Service: Endoscopy;  Laterality: N/A;   ILEOSCOPY N/A 07/22/2018   Procedure: ILEOSCOPY THROUGH STOMA;  Surgeon: Virgel Manifold, MD;  Location: [facility] ENDOSCOPY;  Service: Endoscopy;  Laterality: N/A;   ILEOSTOMY     ILEOSTOMY N/A 09/08/2018   Procedure: ILEOSTOMY REVISION POSSIBLE CREATION;  Surgeon: Herbert Pun, MD;  Location: [facility] ORS;  Service: General;  Laterality: N/A;   ILEOSTOMY CLOSURE N/A 08/15/2018   Procedure: DILATION OF ILEOSTOMY STRICTURE;  Surgeon: Herbert Pun, MD;  Location: [facility] ORS;  Service: General;  Laterality: N/A;   LAPAROTOMY Right 05/04/2018   Procedure: EXPLORATORY LAPAROTOMY right colectomy right and left ostomy;  Surgeon: Herbert Pun, MD;  Location: [facility] ORS;  Service: General;  Laterality: Right;   LEFT ATRIAL APPENDAGE OCCLUSION N/A 01/11/2021   Procedure: LEFT ATRIAL APPENDAGE OCCLUSION ([place]); ABORTED PROCEDURE WITHOUT DEVICE BEING IMPLANTED; Location: [facility]; Surgeon: Mylinda Latina, MD   LUNG BIOPSY     MASTECTOMY Left    2000, [facility]   ROTATOR CUFF REPAIR Right    [place] TAKEDOWN N/A  10/23/2021   Procedure: XI ROBOTIC ASSISTED ILEOSTOMY TAKEDOWN;  Surgeon: Herbert Pun, MD;  Location: [facility] ORS;  Service: General;  Laterality: N/A;  180 minutes for the surgery part please     reports that she quit smoking about 9 years ago. Her smoking use included cigarettes. She has a 10.00 pack-year smoking history. Her smokeless tobacco use includes snuff. She reports current alcohol use of about 3.0 standard drinks of alcohol per week. She reports that she does not use drugs.  No Known Allergies  Family History  Problem Relation Age of Onset   Breast cancer Mother 58   Cancer Mother        Breast    Cirrhosis Father    Breast cancer Paternal Aunt 14    Cancer Maternal Aunt        Breast     Prior to Admission medications   Medication Sig Start Date End Date Taking? Authorizing Provider  acetaminophen (TYLENOL) 325 MG tablet Take 2 tablets (650 mg total) by mouth every 6 (six) hours as needed for mild pain (or Fever >/= 101). Patient taking differently: Take 650 mg by mouth every 4 (four) hours as needed for moderate pain. 12/24/18   Gouru, Illene Silver, MD  albuterol (PROVENTIL HFA;VENTOLIN HFA) 108 (90 Base) MCG/ACT inhaler Inhale 2 puffs into the lungs every 6 (six) hours as needed for wheezing or shortness of breath. 04/24/18   Demetrios Loll, MD  amiodarone (PACERONE) 200 MG tablet Take 1 tablet (200 mg total) by mouth 2 (two) times daily. 11/24/21   Herbert Pun, MD  atorvastatin (LIPITOR) 40 MG tablet Take 1 tablet (40 mg total) by mouth daily. 02/03/22   Ezekiel Slocumb, DO  calcium carbonate (TUMS - DOSED IN MG ELEMENTAL CALCIUM) 500 MG chewable tablet Chew 1 tablet by mouth 2 (two) times daily as needed.    [provider]  citalopram (CELEXA) 20 MG tablet Take 1 tablet (20 mg total) by mouth daily. 02/04/22   Nicole Kindred A, DO  ferrous sulfate 325 (65 FE) MG tablet Take 325 mg by mouth daily with breakfast.    [provider]  Fluticasone-Umeclidin-Vilant (TRELEGY ELLIPTA) 100-62.5-25 MCG/ACT AEPB Inhale 1 puff into the lungs daily. 03/05/22   [provider]  metoprolol tartrate (LOPRESSOR) 25 MG tablet Take 1 tablet (25 mg total) by mouth 2 (two) times daily. 02/03/22   Ezekiel Slocumb, DO  mupirocin ointment (BACTROBAN) 2 % Place into the nose 2 (two) times daily. 05/12/22   Fritzi Mandes, MD  OXYGEN Inhale 3 L into the lungs as needed (shortness of breath).    [provider]  oxymetazoline (AFRIN) 0.05 % nasal spray Place 1 spray into both nostrils 2 (two) times daily.    [provider]  pantoprazole (PROTONIX) 40 MG tablet Take 1 tablet (40 mg total) by mouth 2 (two) times daily before  a meal. 08/05/20   Pokhrel, L[place], MD  sodium bicarbonate 650 MG tablet Take 1,300 mg by mouth daily.    [provider]  torsemide 40 MG TABS Take 40 mg by mouth daily. 02/04/22   Ezekiel Slocumb, DO  zolpidem (AMBIEN) 5 MG tablet Take 5 mg by mouth at bedtime as needed for sleep. 06/23/21   [provider]    Physical Exam: Vitals:   05/22/22 1730 05/22/22 1823 05/22/22 2000 05/22/22 2030  BP:   102/63 108/70  Pulse: 100 (!) 110 (!) 127 (!) 111  Resp: (!) $RemoveB'22 18 17 'AcQzgWLP$ 16  Temp:      TempSrc:      SpO2: 98% 99% 92% 92%  Weight:      Height:       Physical Exam Vitals and nursing note reviewed.  Constitutional:      General: She is not in acute distress.    Appearance: She is ill-appearing. She is not toxic-appearing or diaphoretic.     Interventions: Nasal cannula in place.  HENT:     Head: Normocephalic and atraumatic.     Right Ear: Hearing and external ear normal.     Left Ear: Hearing and external ear normal.     Nose: Nose normal. No nasal deformity.     Mouth/Throat:     Lips: Pink.     Mouth: Mucous membranes are moist.     Tongue: No lesions.     Pharynx: Oropharynx is clear.  Eyes:     Extraocular Movements: Extraocular movements intact.  Neck:     Vascular: No carotid bruit.  Cardiovascular:     Rate and Rhythm: Normal rate. Rhythm irregular.     Pulses: Normal pulses.     Heart sounds: Normal heart sounds.  Pulmonary:     Effort: Pulmonary effort is normal.     Breath sounds: Examination of the right-upper field reveals rales. Examination of the left-upper field reveals rales. Examination of the right-middle field reveals rales. Examination of the left-middle field reveals rales. Examination of the right-lower field reveals rales. Examination of the left-lower field reveals rales. Rales present.  Abdominal:     General: Bowel sounds are normal. There is no distension.     Palpations: Abdomen is soft. There is no mass.     Tenderness: There is  no abdominal tenderness. There is no guarding.     Hernia: No hernia is present.  Musculoskeletal:     Right lower leg: Edema present.     Left lower leg: Edema present.  Skin:    General: Skin is warm.  Neurological:     General: No focal deficit present.     Mental Status: She is alert and oriented to person, place, and time.     Cranial Nerves: Cranial nerves 2-12 are intact.     Motor: Motor function is intact.  Psychiatric:        Attention and Perception: Attention normal.        Mood and Affect: Mood normal.        Speech: Speech normal.        Behavior: Behavior normal. Behavior is cooperative.        Cognition and Memory: Cognition normal.      Labs on Admission: I have personally reviewed following labs and imaging studies BMET Recent Labs  Lab 05/22/22 1605  NA 138  K 4.1  CL 100  CO2 20*  BUN 37*  CREATININE 2.29*  GLUCOSE 62*   Electrolytes Recent Labs  Lab 05/22/22 1605  CALCIUM 9.8   Sepsis Markers No results for input(s): "LATICACIDVEN", "PROCALCITON", "O2SATVEN" in the last 168 hours. ABG No results for input(s): "PHART", "PCO2ART", "PO2ART" in the last 168 hours. Liver Enzymes Recent Labs  Lab 05/22/22 1605  AST 58*  ALT 29  ALKPHOS 73  BILITOT 1.4*  ALBUMIN 4.3   Cardiac Enzymes No results for input(s): "TROPONINI", "PROBNP" in the last 168 hours. No results found for: "DDIMER" Coag's No results for input(s): "APTT", "INR" in the last 168 hours.  No results found for this or any previous  visit (from the past 240 hour(s)).   Current Facility-Administered Medications:    acetaminophen (TYLENOL) tablet 650 mg, 650 mg, Oral, Q6H PRN **OR** acetaminophen (TYLENOL) suppository 650 mg, 650 mg, Rectal, Q6H PRN, Posey Pronto, Tramaine Snell V, MD   albuterol (PROVENTIL) (2.5 MG/3ML) 0.083% nebulizer solution 3 mL, 3 mL, Inhalation, Q6H PRN, Para Skeans, MD   amiodarone (PACERONE) tablet 200 mg, 200 mg, Oral, BID, Posey Pronto, Gretta Cool, MD   atorvastatin (LIPITOR)  tablet 40 mg, 40 mg, Oral, Daily, Posey Pronto, Gretta Cool, MD   citalopram (CELEXA) tablet 20 mg, 20 mg, Oral, Daily, Zoila Ditullio V, MD   dextrose 50 % solution 50 mL, 1 ampule, Intravenous, Once, Duffy Bruce, MD   [START ON 05/23/2022] fluticasone furoate-vilanterol (BREO ELLIPTA) 100-25 MCG/ACT 1 puff, 1 puff, Inhalation, Daily **AND** [START ON 05/23/2022] umeclidinium bromide (INCRUSE ELLIPTA) 62.5 MCG/ACT 1 puff, 1 puff, Inhalation, Daily, Posey Pronto, Gretta Cool, MD   furosemide (LASIX) injection 40 mg, 40 mg, Intravenous, BID, Posey Pronto, Jamison Yuhasz V, MD   heparin injection 5,000 Units, 5,000 Units, Subcutaneous, Q8H, Aden Sek V, MD   hydrALAZINE (APRESOLINE) injection 5 mg, 5 mg, Intravenous, Q4H PRN, Para Skeans, MD   metoprolol tartrate (LOPRESSOR) tablet 25 mg, 25 mg, Oral, BID, Otilio Groleau, Gretta Cool, MD   ondansetron (ZOFRAN) tablet 4 mg, 4 mg, Oral, Q6H PRN **OR** ondansetron (ZOFRAN) injection 4 mg, 4 mg, Intravenous, Q6H PRN, Posey Pronto, April Carlyon V, MD   sodium chloride flush (NS) 0.9 % injection 3 mL, 3 mL, Intravenous, Q12H, Para Skeans, MD  Current Outpatient Medications:    acetaminophen (TYLENOL) 325 MG tablet, Take 2 tablets (650 mg total) by mouth every 6 (six) hours as needed for mild pain (or Fever >/= 101). (Patient taking differently: Take 650 mg by mouth every 4 (four) hours as needed for moderate pain.), Disp: , Rfl:    albuterol (PROVENTIL HFA;VENTOLIN HFA) 108 (90 Base) MCG/ACT inhaler, Inhale 2 puffs into the lungs every 6 (six) hours as needed for wheezing or shortness of breath., Disp: 1 Inhaler, Rfl: 2   amiodarone (PACERONE) 200 MG tablet, Take 1 tablet (200 mg total) by mouth 2 (two) times daily., Disp: 60 tablet, Rfl: 0   atorvastatin (LIPITOR) 40 MG tablet, Take 1 tablet (40 mg total) by mouth daily., Disp: 30 tablet, Rfl: 2   calcium carbonate (TUMS - DOSED IN MG ELEMENTAL CALCIUM) 500 MG chewable tablet, Chew 1 tablet by mouth 2 (two) times daily as needed., Disp: , Rfl:    citalopram (CELEXA) 20 MG  tablet, Take 1 tablet (20 mg total) by mouth daily., Disp: 30 tablet, Rfl: 2   ferrous sulfate 325 (65 FE) MG tablet, Take 325 mg by mouth daily with breakfast., Disp: , Rfl:    Fluticasone-Umeclidin-Vilant (TRELEGY ELLIPTA) 100-62.5-25 MCG/ACT AEPB, Inhale 1 puff into the lungs daily., Disp: , Rfl:    metoprolol tartrate (LOPRESSOR) 25 MG tablet, Take 1 tablet (25 mg total) by mouth 2 (two) times daily., Disp: 60 tablet, Rfl: 2   mupirocin ointment (BACTROBAN) 2 %, Place into the nose 2 (two) times daily., Disp: 22 g, Rfl: 0   OXYGEN, Inhale 3 L into the lungs as needed (shortness of breath)., Disp: , Rfl:    oxymetazoline (AFRIN) 0.05 % nasal spray, Place 1 spray into both nostrils 2 (two) times daily., Disp: , Rfl:    pantoprazole (PROTONIX) 40 MG tablet, Take 1 tablet (40 mg total) by mouth 2 (two) times daily before a meal., Disp: 60 tablet, Rfl:  2   sodium bicarbonate 650 MG tablet, Take 1,300 mg by mouth daily., Disp: , Rfl:    torsemide 40 MG TABS, Take 40 mg by mouth daily., Disp: 30 tablet, Rfl: 2   zolpidem (AMBIEN) 5 MG tablet, Take 5 mg by mouth at bedtime as needed for sleep., Disp: , Rfl:   COVID-19 Labs No results for input(s): "DDIMER", "FERRITIN", "LDH", "CRP" in the last 72 hours. Lab Results  Component Value Date   [place] NEGATIVE 05/08/2022   [place] NEGATIVE 01/26/2022   [place] NEGATIVE 11/26/2021   [place] NEGATIVE 10/19/2021    Radiological Exams on Admission: DG Chest Port 1 View  Result Date: 05/22/2022 CLINICAL DATA:  Right-sided thoracentesis. EXAM: PORTABLE CHEST 1 VIEW COMPARISON:  Earlier same day. FINDINGS: Significant interval improvement patient's right-sided effusion with only small amount of residual right pleural fluid noted. Patient is post right-sided thoracentesis. No evidence of pneumothorax. Stable density over the right mid to upper lung as well as stable prominence of the right hilum. Left lung is clear. Cardiomediastinal  silhouette and remainder of the exam is unchanged. IMPRESSION: 1. Significant interval improvement in patient's right-sided pleural effusion post thoracentesis. No pneumothorax. Minimal residual right pleural fluid. 2. Stable changes over the right mid to upper lung and stable prominence of the right hilum. Electronically Signed   By: Marin Olp M.D.   On: 05/22/2022 17:58   DG Chest Portable 1 View  Result Date: 05/22/2022 CLINICAL DATA:  SOB EXAM: PORTABLE CHEST 1 VIEW COMPARISON:  Chest x-ray 05/08/2022, CT chest 02/21/2022 FINDINGS: The heart and mediastinal contours are unchanged. Atherosclerotic plaque. No focal consolidation. Chronic coarsened markings with no overt pulmonary edema. Interval increase in a small to moderate volume right pleural effusion. No pneumothorax. No acute osseous abnormality. IMPRESSION: 1. Interval increase in a small to moderate right pleural effusion. 2. Aortic Atherosclerosis (ICD10-I70.0) and Emphysema (ICD10-J43.9). Electronically Signed   By: Iven Finn M.D.   On: 05/22/2022 16:25    EKG: Independently reviewed.  A. Fib rvr.    Assessment and Plan: * SOB (shortness of breath) Pt presenting with SOB / dyspneic and mild to moderate respiratory distress. Supportive care with Bipap and diuretics as needed.   Recurrent right pleural effusion Suspect transudate although ma have to d/w pt about options of seeing ct sx for ? Pleurodesis. unfortunately due to her h/o cancer prognosis is  Poor.  d/w pt about code status and she wishes to be full code.    Acute on chronic respiratory failure with hypoxia (HCC) Initially on bipap and s/p thoracentesis is on 2 L. SpO2: 92 % O2 Flow Rate (L/min): 2 L/min     Paroxysmal atrial fibrillation (HCC) Patient in A-fib RVR heart rate frequently and intermittently increasing to over 120s. Patient continued on her amiodarone, and metoprolol and will follow electrolytes and correct.   Acute on chronic combined  systolic and diastolic CHF (congestive heart failure) ([place]) Suspect her rec pleural effusion is from CHF although not certain no cytology sent today.  Will defer to her oncology and pulmonary to f/u with eval of malignancy concern for rec pleural effusion.  Clinically patient is stable but again prognosis is fair on exam she has rales all the way throughout her lung field in both sides. We will continue patient on Lasix 40 mg IV twice daily for 2 doses and monitor strict I's and O's Daily weights. Hold patient's torsemide. We will continue patient on her amiodarone, metoprolol and hydralazine. Cardiology has been  consulted.  Acute kidney injury superimposed on CKD [facility]) Lab Results  Component Value Date   CREATININE 2.29 (H) 05/22/2022   CREATININE 2.07 (H) 05/12/2022   CREATININE 2.00 (H) 05/11/2022  Mild worsening of kidney function attribute to patient's acute congestive heart failure presentation. We will avoid any contrast and renally dose all needed medications.  Essential hypertension Blood pressure 108/70, pulse (!) 111, temperature 98.2 F (36.8 C), temperature source Oral, resp. rate 16, height $RemoveBe'5\' 5"'fXynugkgu$  (1.651 m), weight 51 kg, SpO2 92 %. Metoprolol continued, torsemide held, as needed hydralazine.    GERD (gastroesophageal reflux disease) We will continue patient on proton pump inhibitor therapy.     DVT prophylaxis:  Heparin    Code Status:  Full code    Family Communication:  King,[name] S (Daughter)  [phone] (Mobile)   Disposition Plan:  Home    Consults called:  None   Admission status: Inpatient.     Para Skeans MD [facility]  6 PM- 2 AM. Please contact me via secure Chat 6 PM-2 AM. [phone] ( Pager ) To contact the [facility] Attending or Consulting provider [place] or covering provider during after hours [place], for this patient.   Check the care team in [facility] and look for a) attending/consulting [facility] provider listed and b) the [facility] team  listed Log into [email or web address] and use [place]'s universal password to access. If you do not have the password, please contact the hospital operator. Locate the [facility] provider you are looking for under [facility] and page to a number that you can be directly reached. If you still have difficulty reaching the provider, please page the [facility] (Director on Call) for the Hospitalists listed on amion for assistance. [email or web address] 05/22/2022, 9:07 PM

## 2022-05-22 NOTE — ED Provider Notes (Signed)
[facility] Provider Note    Event Date/Time   First MD Initiated Contact with Patient 05/22/22 1536     (approximate)   History   Shortness of Breath   HPI  Danielle [name] is a 66 y.o. female  here with SOB. Pt sent in from Cardiology office. Pt has a h/o CHF, AFib RVR s/p recent admission. She reports that today, she began to develop acute severe SOB, particularly when at her Cardiologist's office. She has been increasing her O2 at home recently. She feels incredibly winded and is having a hard time speaking at all. She was noted to be in AFib RVR at her Cardiologist appt with HR 140s, sent in for evaluation. Remainder of history limited 2/2 her dyspnea.  Level 5 caveat invoked as remainder of history, ROS, and physical exam limited due to patient's acuity, resp distress.        Physical Exam   Triage Vital Signs: ED Triage Vitals  Enc Vitals Group     BP 05/22/22 1523 118/80     Pulse Rate 05/22/22 1523 (!) 104     Resp 05/22/22 1523 17     Temp 05/22/22 1523 98.2 F (36.8 C)     Temp Source 05/22/22 1523 Oral     SpO2 05/22/22 1523 92 %     Weight 05/22/22 1526 112 lb 7 oz (51 kg)     Height 05/22/22 1526 5\' 5"  (1.651 m)     Head Circumference --      Peak Flow --      Pain Score 05/22/22 1525 0     Pain Loc --      Pain Edu? --      Excl. in [place]? --     Most recent vital signs: Vitals:   05/22/22 1730 05/22/22 1823  BP:    Pulse: 100 (!) 110  Resp: (!) 22 18  Temp:    SpO2: 98% 99%     General: Awake, mild respiratory distress.. CV:  Good peripheral perfusion. Tachycardic, irregularly irregular rhythm. Resp:  Marked increased WOB and tachypnea, bilateral rales. Abd:  No distention. No tenderness. Other:  Trace edema bl LE.   ED Results / Procedures / Treatments   Labs (all labs ordered are listed, but only abnormal results are displayed) Labs Reviewed  CBC WITH DIFFERENTIAL/PLATELET - Abnormal; Notable for the  following components:      Result Value   RBC 3.04 (*)    Hemoglobin 9.7 (*)    HCT 30.1 (*)    nRBC 0.5 (*)    All other components within normal limits  COMPREHENSIVE METABOLIC PANEL - Abnormal; Notable for the following components:   CO2 20 (*)    Glucose, Bld 62 (*)    BUN 37 (*)    Creatinine, Ser 2.29 (*)    Total Protein 8.5 (*)    AST 58 (*)    Total Bilirubin 1.4 (*)    GFR, Estimated 23 (*)    Anion gap 18 (*)    All other components within normal limits  BRAIN NATRIURETIC PEPTIDE - Abnormal; Notable for the following components:   B Natriuretic Peptide 1,925.4 (*)    All other components within normal limits  TROPONIN I (HIGH SENSITIVITY) - Abnormal; Notable for the following components:   Troponin I (High Sensitivity) 37 (*)    All other components within normal limits  SARS CORONAVIRUS 2 BY RT PCR  PROCALCITONIN  CBG MONITORING, ED  TROPONIN I (HIGH SENSITIVITY)     EKG Atrial fibrillation with RVR, VR 104. ST flattening in lateral leads likely demand related. PVCs. QT prolonged at 557.   RADIOLOGY CXR: Interval increase in small to moderate pleural effusion   I also independently reviewed and agree with radiologist interpretations.   PROCEDURES:  Critical Care performed: Yes, see critical care procedure note(s)  .Critical Care  Performed by: Duffy Bruce, MD Authorized by: Duffy Bruce, MD   Critical care provider statement:    Critical care time (minutes):  30   Critical care time was exclusive of:  Separately billable procedures and treating other patients   Critical care was necessary to treat or prevent imminent or life-threatening deterioration of the following conditions:  Cardiac failure, circulatory failure and respiratory failure   Critical care was time spent personally by me on the following activities:  Development of treatment plan with patient or surrogate, discussions with consultants, evaluation of patient's response to  treatment, examination of patient, ordering and review of laboratory studies, ordering and review of radiographic studies, ordering and performing treatments and interventions, pulse oximetry, re-evaluation of patient's condition and review of old charts     MEDICATIONS ORDERED IN ED: Medications  dextrose 50 % solution 50 mL (50 mLs Intravenous Not Given 05/22/22 1708)  acetaminophen (TYLENOL) tablet 1,000 mg (1,000 mg Oral Given 05/22/22 1708)     IMPRESSION / MDM / [place] / ED COURSE  I reviewed the triage vital signs and the nursing notes.                               The patient is on the cardiac monitor to evaluate for evidence of arrhythmia and/or significant heart rate changes.   Ddx:  Differential includes the following, with pertinent life- or limb-threatening emergencies considered:  Recurrent right-sided pleural effusion, CHF, acute on chronic respiratory failure, pneumonia, pneumothorax, atelectasis, anemia   Patient's presentation is most consistent with acute presentation with potential threat to life or bodily function.  MDM:  66 year old female with past medical history of CHF, recurrent right-sided pleural effusion, here with acute on chronic respiratory distress and hypoxia.  Patient arrives with profound hypoxia with any kind of speaking or exertion.  She was placed on BiPAP with improvement in oxygenation.  Repeat plain films show recurrent right-sided pleural effusion.  Fortunately, I was able to call radiology and are willing to take her for urgent thoracentesis before leaving today.  Patient understands, consents, and is in agreement this plan.  Suspect she will need to be admitted afterwards for further monitoring, but this should allow her to get off of BiPAP.  Otherwise, no infectious symptoms.  No focal findings to suggest pneumonia.  CBC shows no leukocytosis.  Anemia is at baseline.  CMP is notable for mild increase in baseline creatinine but  otherwise unremarkable.  Glucose 62.  Patient given something to eat.  Patient feels better after paracentesis but remains tachycardic.  As mentioned, she has elevated BNP but also with increasing and compared to baseline.  Will need to be monitored closely.  She is also in persistent A-fib, will ultimately need rate control but will monitor blood pressure after thoracentesis prior to initiating this.  Patient admitted to medicine.   MEDICATIONS GIVEN IN ED: Medications  dextrose 50 % solution 50 mL (50 mLs Intravenous Not Given 05/22/22 1708)  acetaminophen (TYLENOL) tablet 1,000 mg (1,000 mg Oral Given 05/22/22  1708)     Consults:     EMR reviewed  Reviewed d/c summary from 7/29, pt admitted for severe epistaxis, recurrent R pleural effusion and CHF, pAFib, COPD, CKD     FINAL CLINICAL IMPRESSION(S) / ED DIAGNOSES   Final diagnoses:  Acute on chronic respiratory failure with hypoxemia (HCC)  Recurrent pleural effusion     Rx / DC Orders   ED Discharge Orders     None        Note:  This document was prepared using Dragon voice recognition software and may include unintentional dictation errors.   Duffy Bruce, MD 05/22/22 [phone]

## 2022-05-22 NOTE — ED Notes (Signed)
Pt given two apple juices with additional sugar added to boost glucose per MD order.

## 2022-05-22 NOTE — ED Provider Triage Note (Signed)
Emergency Medicine Provider Triage Evaluation Note  Danielle [name], a 66 y.o. female  was evaluated in triage.  Pt complains of heart palpitations and shortness of breath.  With a history of A-fib with RVR, presents to the ED from her cardiology office.  They were doing a routine hospital follow-up from her ED visit last week.  Patient was found to be an unstable rhythm, and advised to report to the ED.  She is on 4 L by [place] chronically at home.  Patient presents with hypoxic O2 sats in the high 80s..  Review of Systems  Positive: Tachy, SOB Negative: FCS  Physical Exam  There were no vitals taken for this visit. Gen:   Awake, no distress NAD Resp:  Normal effort CTA MSK:   Moves extremities without difficulty  CVS:  Tachy sinus rate  Medical Decision Making  Medically screening exam initiated at 3:25 PM.  Appropriate orders placed.  JUDYTHE POSTEMA was informed that the remainder of the evaluation will be completed by another provider, this initial triage assessment does not replace that evaluation, and the importance of remaining in the ED until their evaluation is complete.  Geriatric patient presents to the ED for evaluation of A-fib RVR with shortness of breath and hypoxia on 4 L of O2 via nasal cannula.   Melvenia Needles, PA-C 05/22/22 1533

## 2022-05-22 NOTE — Assessment & Plan Note (Addendum)
Suspect her rec pleural effusion is from CHF although not certain no cytology sent today.  Will defer to her oncology and pulmonary to f/u with eval of malignancy concern for rec pleural effusion.  Clinically patient is stable but again prognosis is fair on exam she has rales all the way throughout her lung field in both sides. We will continue patient on Lasix 40 mg IV twice daily for 2 doses and monitor strict I's and O's Daily weights. Hold patient's torsemide. We will continue patient on her amiodarone, metoprolol and hydralazine. Cardiology has been consulted.

## 2022-05-22 NOTE — Assessment & Plan Note (Signed)
Blood pressure 108/70, pulse (!) 111, temperature 98.2 F (36.8 C), temperature source Oral, resp. rate 16, height 5\' 5"  (1.651 m), weight 51 kg, SpO2 92 %. Metoprolol continued, torsemide held, as needed hydralazine.

## 2022-05-22 NOTE — ED Triage Notes (Signed)
Pt was sent here by cardiology. Pt was there for a follow up and EKG showed AFIB RVR. EKG here AFIB. Pt feels shob and is on her home O2 of 4L

## 2022-05-22 NOTE — Assessment & Plan Note (Signed)
Pt presenting with SOB / dyspneic and mild to moderate respiratory distress. Supportive care with Bipap and diuretics as needed.

## 2022-05-22 NOTE — Assessment & Plan Note (Signed)
Patient in A-fib RVR heart rate frequently and intermittently increasing to over 120s. Patient continued on her amiodarone, and metoprolol and will follow electrolytes and correct.

## 2022-05-22 NOTE — Assessment & Plan Note (Signed)
We will continue patient on proton pump inhibitor therapy.

## 2022-05-22 NOTE — ED Notes (Signed)
Warm compress applied to arm

## 2022-05-22 NOTE — Assessment & Plan Note (Signed)
Initially on bipap and s/p thoracentesis is on 2 L. SpO2: 92 % O2 Flow Rate (L/min): 2 L/min

## 2022-05-22 NOTE — Assessment & Plan Note (Signed)
Suspect transudate although ma have to d/w pt about options of seeing ct sx for ? Pleurodesis. unfortunately due to her h/o cancer prognosis is  Poor.  d/w pt about code status and she wishes to be full code.

## 2022-05-23 DIAGNOSIS — J90 Pleural effusion, not elsewhere classified: Secondary | ICD-10-CM

## 2022-05-23 DIAGNOSIS — I480 Paroxysmal atrial fibrillation: Secondary | ICD-10-CM | POA: Diagnosis not present

## 2022-05-23 DIAGNOSIS — I5043 Acute on chronic combined systolic (congestive) and diastolic (congestive) heart failure: Secondary | ICD-10-CM

## 2022-05-23 DIAGNOSIS — R0600 Dyspnea, unspecified: Secondary | ICD-10-CM | POA: Diagnosis present

## 2022-05-23 LAB — COMPREHENSIVE METABOLIC PANEL
ALT: 16 U/L (ref 0–44)
AST: 29 U/L (ref 15–41)
Albumin: 3.3 g/dL — ABNORMAL LOW (ref 3.5–5.0)
Alkaline Phosphatase: 56 U/L (ref 38–126)
Anion gap: 7 (ref 5–15)
BUN: 38 mg/dL — ABNORMAL HIGH (ref 8–23)
CO2: 26 mmol/L (ref 22–32)
Calcium: 8.9 mg/dL (ref 8.9–10.3)
Chloride: 104 mmol/L (ref 98–111)
Creatinine, Ser: 2.44 mg/dL — ABNORMAL HIGH (ref 0.44–1.00)
GFR, Estimated: 21 mL/min — ABNORMAL LOW (ref >60)
Glucose, Bld: 81 mg/dL (ref 70–99)
Potassium: 3.8 mmol/L (ref 3.5–5.1)
Sodium: 137 mmol/L (ref 135–145)
Total Bilirubin: 1 mg/dL (ref 0.3–1.2)
Total Protein: 6.4 g/dL — ABNORMAL LOW (ref 6.5–8.1)

## 2022-05-23 LAB — CBG MONITORING, ED
Glucose-Capillary: 82 mg/dL (ref 70–99)
Glucose-Capillary: 116 mg/dL — ABNORMAL HIGH (ref 70–99)

## 2022-05-23 LAB — PROCALCITONIN: Procalcitonin: <0.1 ng/mL

## 2022-05-23 LAB — CBC
HCT: 23.9 % — ABNORMAL LOW (ref 36.0–46.0)
Hemoglobin: 8 g/dL — ABNORMAL LOW (ref 12.0–15.0)
MCH: 32.3 pg (ref 26.0–34.0)
MCHC: 33.5 g/dL (ref 30.0–36.0)
MCV: 96.4 fL (ref 80.0–100.0)
Platelets: 249 10*3/uL (ref 150–400)
RBC: 2.48 MIL/uL — ABNORMAL LOW (ref 3.87–5.11)
RDW: 14.7 % (ref 11.5–15.5)
WBC: 4.6 10*3/uL (ref 4.0–10.5)
nRBC: 1.5 % — ABNORMAL HIGH (ref 0.0–0.2)

## 2022-05-23 MED ORDER — HYDRALAZINE HCL 10 MG PO TABS
10.0000 mg | ORAL_TABLET | Freq: Three times a day (TID) | ORAL | Status: DC
  Administered 2022-05-23 – 2022-05-26 (×9): 10 mg via ORAL
  Filled 2022-05-23 (×10): qty 1

## 2022-05-23 MED ORDER — METOLAZONE 2.5 MG PO TABS
2.5000 mg | ORAL_TABLET | Freq: Once | ORAL | Status: AC
  Administered 2022-05-23: 2.5 mg via ORAL
  Filled 2022-05-23 (×3): qty 1

## 2022-05-23 MED ORDER — AMIODARONE HCL 200 MG PO TABS
200.0000 mg | ORAL_TABLET | Freq: Every day | ORAL | Status: DC
  Administered 2022-05-24 – 2022-05-26 (×3): 200 mg via ORAL
  Filled 2022-05-23 (×3): qty 1

## 2022-05-23 MED ORDER — LIDOCAINE 5 % EX PTCH
1.0000 | MEDICATED_PATCH | CUTANEOUS | Status: DC
  Administered 2022-05-23: 1 via TRANSDERMAL
  Filled 2022-05-23 (×3): qty 1

## 2022-05-23 MED ORDER — OXYCODONE HCL 5 MG PO TABS
5.0000 mg | ORAL_TABLET | Freq: Once | ORAL | Status: AC
  Administered 2022-05-23: 5 mg via ORAL
  Filled 2022-05-23: qty 1

## 2022-05-23 MED ORDER — ISOSORBIDE DINITRATE 10 MG PO TABS
5.0000 mg | ORAL_TABLET | Freq: Three times a day (TID) | ORAL | Status: DC
  Administered 2022-05-23 – 2022-05-26 (×8): 5 mg via ORAL
  Filled 2022-05-23 (×12): qty 0.5

## 2022-05-23 NOTE — Consult Note (Signed)
   Heart Failure Nurse Navigator Note  HFrEF 35 to 40%.  Wall motion abnormalities noted.  Left ventricular internal cavity mildly dilated.  Grade 2 diastolic dysfunction.  Moderate mitral regurgitation moderate to severe tricuspid regurgitation. Moderate aortic valve stenosis.  She presented to the emergency room with complaints of worsening shortness of breath.  At baseline she uses 4 to 5 L O2 at home.  This x-ray revealed recurrent right pleural effusion.  Comorbidities:  History of lung and esophageal cancer COPD Paroxysmal atrial fibrillation Chronic kidney disease  Medications:  Amiodarone 200 mg daily Atorvastatin 40 mg daily Hydralazine 10 mg 3 times a day Isosorbide did not titrate 5 mg 3 times a day Zaroxolyn 2.5 mg x 1 Metoprolol tartrate 25 mg twice a day  Labs:  Sodium 137, potassium 3.8, chloride 104, CO2 26, BUN 38, creatinine 2.44, estimated GFR 21 Weight is 51 kg Blood pressure 99/87 Thoracentesis removal of 900 mL    Initial meeting with patient on this admission.  She was currently sitting up at the bedside eating lunch in the emergency room.  She states at home she had not had much of an appetite nor did she feel that she was drinking over her fluid restriction.  Had noted increasing shortness of breath.  She states that she was weighing herself daily and had not noted a weight gain.  Discussed the importance of weight gain and what to report.  Along with the changes in symptoms.  Discussed that she has an appointment to follow-up with Darylene Price in the outpatient heart failure clinic in October but patient states that she wished to be seen sooner than that.  She was given an appointment for September 12 at 10:30 in the morning.  She had no further questions.  Pricilla Riffle RN CHFN

## 2022-05-23 NOTE — Evaluation (Signed)
Physical Therapy Evaluation Patient Details Name: AAMIYAH DERRICK MRN: [id number] DOB: [birth year] Today's Date: 05/23/2022  History of Present Illness  Pt admitted for SOB symptoms and is s/p thoracentesis on 05/22/22. History of lung cancer, esophageal cancer, CHF, and chronic O2 at 3L.  Clinical Impression  Pt is a pleasant 66 year old female who was admitted for SOB symptoms and is s/p thoracentesis. Pt performs bed mobility with independence, transfers with independence, and ambulation with supervision and no AD.  Reports she hasn't received PT in awhile and doesn't feel she is in need while admitted. Encouraged to reach out to MD if needs change. Pt demonstrates all bed mobility/transfers/ambulation at baseline level. Pt does not require any further PT needs at this time. Pt will be dc in house and does not require follow up. RN aware. Will dc current orders.      Recommendations for follow up therapy are one component of a multi-disciplinary discharge planning process, led by the attending physician.  Recommendations may be updated based on patient status, additional functional criteria and insurance authorization.  Follow Up Recommendations No PT follow up      Assistance Recommended at Discharge PRN  Patient can return home with the following       Equipment Recommendations None recommended by PT  Recommendations for Other Services       Functional Status Assessment Patient has not had a recent decline in their functional status     Precautions / Restrictions Precautions Precautions: Fall Restrictions Weight Bearing Restrictions: No      Mobility  Bed Mobility Overal bed mobility: Independent             General bed mobility comments: safe technique    Transfers Overall transfer level: Independent Equipment used: None               General transfer comment: safe technique with upright posture without AD    Ambulation/Gait Ambulation/Gait assistance:  Supervision Gait Distance (Feet): 50 Feet Assistive device: None Gait Pattern/deviations: Step-through pattern       General Gait Details: ambulated in room without AD. All mobility on 2L of O2 with sats WNL.  Stairs            Wheelchair Mobility    Modified Rankin (Stroke Patients Only)       Balance Overall balance assessment: Modified Independent                                           Pertinent Vitals/Pain Pain Assessment Pain Assessment: No/denies pain    Home Living Family/patient expects to be discharged to:: Private residence Living Arrangements: Children;Other relatives Available Help at Discharge: Family Type of Home: Mobile home Home Access: Stairs to enter Entrance Stairs-Rails: Right Entrance Stairs-Number of Steps: 4-5   Home Layout: One level Home Equipment: Cane - single [place] (2 wheels);BSC/3in1;Wheelchair - manual      Prior Function Prior Level of Function : Independent/Modified Independent             Mobility Comments: reports she has been indep at baseline, occasionally uses RW when she feels SOB symptoms. ADLs Comments: reports she is able to perform ADLs with mod I     Hand Dominance        Extremity/Trunk Assessment   Upper Extremity Assessment Upper Extremity Assessment: Overall WFL for tasks assessed  Lower Extremity Assessment Lower Extremity Assessment: Overall WFL for tasks assessed       Communication   Communication: No difficulties  Cognition Arousal/Alertness: Awake/alert Behavior During Therapy: WFL for tasks assessed/performed Overall Cognitive Status: Within Functional Limits for tasks assessed                                          General Comments      Exercises     Assessment/Plan    PT Assessment Patient does not need any further PT services  PT Problem List         PT Treatment Interventions      PT Goals (Current goals can be  found in the Care Plan section)  Acute Rehab PT Goals Patient Stated Goal: to breathe easier PT Goal Formulation: All assessment and education complete, DC therapy Time For Goal Achievement: 05/23/22 Potential to Achieve Goals: Good    Frequency       Co-evaluation               AM-PAC PT "6 Clicks" Mobility  Outcome Measure Help needed turning from your back to your side while in a flat bed without using bedrails?: None Help needed moving from lying on your back to sitting on the side of a flat bed without using bedrails?: None Help needed moving to and from a bed to a chair (including a wheelchair)?: None Help needed standing up from a chair using your arms (e.g., wheelchair or bedside chair)?: None Help needed to walk in hospital room?: None Help needed climbing 3-5 steps with a railing? : None 6 Click Score: 24    End of Session Equipment Utilized During Treatment: Oxygen Activity Tolerance: Patient tolerated treatment well Patient left: in bed Nurse Communication: Mobility status PT Visit Diagnosis: Muscle weakness (generalized) (M62.81)    Time: 5697-9480 PT Time Calculation (min) (ACUTE ONLY): 13 min   Charges:   PT Evaluation $PT Eval Low Complexity: 1 Low          Greggory Stallion, PT, DPT, GCS [phone]   Adreanna Fickel 05/23/2022, 2:18 PM

## 2022-05-23 NOTE — Progress Notes (Signed)
       CROSS COVER NOTE  NAME: GINIA RUDELL MRN: [id number] DOB : [birth year]    Date of Service   05/23/22  HPI/Events of Note   Medication request received for 5/10 pain at thoracentesis site not relieved by Tylenol  Interventions   Plan: 5 mg PO Oxycodone x1 Lidocaine patch     This document was prepared using Dragon voice recognition software and may include unintentional dictation errors.  Neomia Glass DNP, MHA, FNP-BC Nurse Practitioner [facility] Pager [phone]

## 2022-05-23 NOTE — Progress Notes (Signed)
OT Cancellation Note  Patient Details Name: Danielle [name] MRN: [id number] DOB: [birth year]   Cancelled Treatment:    Reason Eval/Treat Not Completed: OT screened, no needs identified, will sign off (per PT and patient, pt feels she is at or close to baseline following thoracocentisis. OT will screen and sign off at this time. Please reconsult if there is a change in functional status.) Shanon Payor, OTD OTR/L  05/23/22, 3:32 PM

## 2022-05-23 NOTE — Consult Note (Cosign Needed)
[place] NOTE       Patient ID: Danielle [name] MRN: [id number] DOB/AGE: 1956/09/08 66 y.o.  Admit date: 05/22/2022 Referring Physician Dr. Florina Ou Primary Physician [facility] Primary Cardiologist Dr. [name] Reason for Consultation acute on chronic HFrEF  HPI: Danielle [name] is a 66 year old female with a past medical history of advanced COPD on 3L O2, paroxysmal atrial fibrillation/flutter (dose reduced eliquis held due to recent epistaxis), HFrEF (LVEF 35-40%, g2dd, mod-sev TR, mod AS, mod MS 01/2022), recurrent Right pleural effusion, CKD (baseline Cr. 2-2.5), remote history of lung cancer s/p radiation, esophageal cancer s/p radiation, MGUS, who presented to [facility] ED 05/22/22 from Dr. Alveria Apley office with worsening shortness of breath. Cardiology is consulted for further assistance.   She presents today with shortness of breath above her baseline, requiring 4 to 5 L of oxygen on admission on top of her recent baseline of 3 L that she was recently discharged on.  She felt her heart racing yesterday in her cardiologist's office and has had positional dizziness over the past 2 weeks.  She is having fairly poor appetite and oral intake but also notes she has not been urinating as much with her daily 40 mg of torsemide.  She was given 40 mg of IV Lasix x 2 doses and notes she did not urinate very much.  Most recently she was admitted for 4 days, discharged on 7/29, for a recurrent right pleural effusion and profound epistaxis in the setting of using high flow oxygen and Eliquis.  She was seen by ENT who cauterized the bleeding at bedside on Eliquis was discontinued and has not been restarted yet.  She denies any chest pain, lower extremity or abdominal swelling.  No epistaxis since she has been taken off Eliquis, admits to dark stools but she does take oral iron daily.  She had a right-sided thoracentesis yesterday afternoon where 900 cc of clear amber  fluid was drained, the patient is back to her baseline oxygen requirement of 3 L this morning and feels much better.  Her labs are notable for a potassium of 3.8, BUN/creatinine 38/2.44, GFR 21, slightly worsening from yesterday at 2.29, GFR 23.  BMP significantly elevated at 1900.  High-sensitivity troponin minimally elevated with a flat trend at 37-45.  Hemoglobin with significant decline overnight from 9.7-8.0.  Chest x-ray post right thoracentesis shows significant interval improvement in the right-sided pleural effusion without pneumothorax, minimal residual right pleural fluid.  Review of systems complete and found to be negative unless listed above     Past Medical History:  Diagnosis Date   Acute respiratory failure due to COVID-19 ([place]) 09/27/2019   Anemia    Anxiety    Aortic atherosclerosis ([place])    Aortic valve stenosis 02/10/2018   a.) TTE 02/10/2018: EF 55-60%: mild AS with MPG of 12 mmHg. b.) TTE 04/21/2018: EF 35-40%; mild AS with MPG 8 mmHg. c.) TTE 11/07/2019: EF 50-55%; mild AS with MPG 10 mmHg.   Arthritis    Atrial fibrillation (HCC)    a.) CHA2DS2-VASc = 4 (age, sex, HTN, aortic plaque). b.) Rate/rhythm maintained on oral carvedilol; chronically anticoagulated on dose reduced apixaban. c.)  Attempted deployment of LAA occlusive device on 01/11/2021; parameters failed and procedure aborted.   Breast cancer, left ([name]) 2000   a.) T2N1M0; ER/PR (+) --> Tx'd with total mastectomy, LN resection, XRT, and chemotherapy   Cancer of right lung ([place]) 07/30/2016   a.) adenocarcinoma; ALK, ROS1, PDL1, BRAF,  EGFR all negative.   Chronic diastolic CHF (congestive heart failure) ([place]) 11/26/2021   CKD (chronic kidney disease), stage IV (HCC)    COPD (chronic obstructive pulmonary disease) (HCC)    Dependence on supplemental oxygen    Depression    Diastolic dysfunction 78/58/8502   a.) TTE 02/10/2017: EF 55-60%; G2DD. b.) TTE 04/21/2018: EF 35-40%; mild LA dilation; mod MV  regurgitation. c.) TTE 11/07/2019: EF 50-55%; G1DD.   DOE (dyspnea on exertion)    GERD (gastroesophageal reflux disease)    Heart murmur    History of 2019 novel coronavirus disease (COVID-19) 10/14/2019   HLD (hyperlipidemia)    Hypertension    Long term current use of anticoagulant    a.) apixaban   Lymphedema    Personal history of chemotherapy    Personal history of radiation therapy    Respiratory tract infection due to COVID-19 virus 09/27/2019   SBO (small bowel obstruction) ([place]) 11/05/2020   Vitamin D deficiency     Past Surgical History:  Procedure Laterality Date   Breast Biospy Left    [facility]   BREAST SURGERY     CARDIOVERSION N/A 02/23/2022   Procedure: CARDIOVERSION;  Surgeon: Corey Skains, MD;  Location: [facility] ORS;  Service: Cardiovascular;  Laterality: N/A;   COLONOSCOPY N/A 04/30/2018   Procedure: COLONOSCOPY;  Surgeon: Virgel Manifold, MD;  Location: [facility] ENDOSCOPY;  Service: Endoscopy;  Laterality: N/A;   COLONOSCOPY N/A 07/22/2018   Procedure: COLONOSCOPY;  Surgeon: Virgel Manifold, MD;  Location: [facility] ENDOSCOPY;  Service: Endoscopy;  Laterality: N/A;   COLONOSCOPY WITH PROPOFOL N/A 09/21/2021   Procedure: COLONOSCOPY WITH PROPOFOL;  Surgeon: Benjamine Sprague, DO;  Location: [place] ENDOSCOPY;  Service: General;  Laterality: N/A;   COLONOSCOPY WITH PROPOFOL N/A 03/11/2022   Procedure: COLONOSCOPY WITH PROPOFOL;  Surgeon: Annamaria Helling, DO;  Location: [facility] ENDOSCOPY;  Service: Gastroenterology;  Laterality: N/A;   DILATION AND CURETTAGE OF UTERUS     ELECTROMAGNETIC NAVIGATION BROCHOSCOPY Right 04/11/2016   Procedure: ELECTROMAGNETIC NAVIGATION BRONCHOSCOPY;  Surgeon: Vilinda Boehringer, MD;  Location: [facility] ORS;  Service: Cardiopulmonary;  Laterality: Right;   ESOPHAGOGASTRODUODENOSCOPY N/A 07/22/2018   Procedure: ESOPHAGOGASTRODUODENOSCOPY (EGD);  Surgeon: Virgel Manifold, MD;  Location: [facility] ENDOSCOPY;  Service: Endoscopy;  Laterality: N/A;    ESOPHAGOGASTRODUODENOSCOPY (EGD) WITH PROPOFOL N/A 05/07/2018   Procedure: ESOPHAGOGASTRODUODENOSCOPY (EGD) WITH PROPOFOL;  Surgeon: Lucilla Lame, MD;  Location: [facility] ENDOSCOPY;  Service: Endoscopy;  Laterality: N/A;   ESOPHAGOGASTRODUODENOSCOPY (EGD) WITH PROPOFOL N/A 04/24/2019   Procedure: ESOPHAGOGASTRODUODENOSCOPY (EGD) WITH PROPOFOL;  Surgeon: Jonathon Bellows, MD;  Location[facility] END[facility]COPY;  Service: Gastroenterology;  Laterality: N/A;   ESOPHAGOGASTRODUODENOSCOPY (EGD) WITH PROPOFOL N/A 01/12/2020   Procedure: ESOPHAGOGASTRODUODENOSCOPY (EGD) WITH PROPOFOL;  Surgeon: Jonathon Bellows, MD;  Location: [facility] ENDOSCOPY;  Service: Gastroenterology;  Laterality: N/A;   ESOPHAGOGASTRODUODENOSCOPY (EGD) WITH PROPOFOL N/A 04/28/2020   Procedure: ESOPHAGOGASTRODUODENOSCOPY (EGD) WITH PROPOFOL;  Surgeon: Jonathon Bellows, MD;  Location: [facility] ENDOSCOPY;  Service: Gastroenterology;  Laterality: N/A;   ESOPHAGOGASTRODUODENOSCOPY (EGD) WITH PROPOFOL N/A 03/11/2022   Procedure: ESOPHAGOGASTRODUODENOSCOPY (EGD) WITH PROPOFOL;  Surgeon: Annamaria Helling, DO;  Location: [facility] ENDOSCOPY;  Service: Gastroenterology;  Laterality: N/A;   EUS N/A 05/07/2019   Procedure: FULL UPPER ENDOSCOPIC ULTRASOUND (EUS) RADIAL;  Surgeon: Jola Schmidt, MD;  Location: [facility] ENDOSCOPY;  Service: Endoscopy;  Laterality: N/A;   ILEOSCOPY N/A 07/22/2018   Procedure: ILEOSCOPY THROUGH STOMA;  Surgeon: Virgel Manifold, MD;  Location: [facility] ENDOSCOPY;  Service: Endoscopy;  Laterality: N/A;   ILEOSTOMY  ILEOSTOMY N/A 09/08/2018   Procedure: ILEOSTOMY REVISION POSSIBLE CREATION;  Surgeon: Herbert Pun, MD;  Location: [facility] ORS;  Service: General;  Laterality: N/A;   ILEOSTOMY CLOSURE N/A 08/15/2018   Procedure: DILATION OF ILEOSTOMY STRICTURE;  Surgeon: Herbert Pun, MD;  Location: [facility] ORS;  Service: General;  Laterality: N/A;   LAPAROTOMY Right 05/04/2018   Procedure: EXPLORATORY LAPAROTOMY right colectomy right and left  ostomy;  Surgeon: Herbert Pun, MD;  Location: [facility] ORS;  Service: General;  Laterality: Right;   LEFT ATRIAL APPENDAGE OCCLUSION N/A 01/11/2021   Procedure: LEFT ATRIAL APPENDAGE OCCLUSION ([place]); ABORTED PROCEDURE WITHOUT DEVICE BEING IMPLANTED; Location: [facility]; Surgeon: Mylinda Latina, MD   LUNG BIOPSY     MASTECTOMY Left    2000, [place]   ROTATOR CUFF REPAIR Right    [place]   XI ROBOTIC ASSISTED COLOSTOMY TAKEDOWN N/A 10/23/2021   Procedure: XI ROBOTIC ASSISTED ILEOSTOMY TAKEDOWN;  Surgeon: Herbert Pun, MD;  Location: [facility] ORS;  Service: General;  Laterality: N/A;  180 minutes for the surgery part please    (Not in a hospital admission)  Social History   Socioeconomic History   Marital status: Divorced    Spouse name: Not on file   Number of children: 3   Years of education: Not on file   Highest education level: Not on file  Occupational History   Occupation: Retired   Tobacco Use   Smoking status: Former    Packs/day: 0.50    Years: 20.00    Total pack years: 10.00    Types: Cigarettes    Quit date: 07/02/2012    Years since quitting: 9.8   Smokeless tobacco: Current    Types: Snuff  Vaping Use   Vaping Use: Never used  Substance and Sexual Activity   Alcohol use: Yes    Alcohol/week: 3.0 standard drinks of alcohol    Types: 3 Cans of beer per week    Comment: Occasionally beer   Drug use: No   Sexual activity: Not Currently  Other Topics Concern   Not on file  Social History Narrative   Not on file   Social Determinants of Health   Financial Resource Strain: High Risk (07/05/2018)   Overall Financial Resource Strain (CARDIA)    Difficulty of Paying Living Expenses: Hard  Food Insecurity: Food Insecurity Present (07/05/2018)   Hunger Vital Sign    Worried About Running Out of Food in the Last Year: Often true    Ran Out of Food in the Last Year: Often true  Transportation Needs: No Transportation Needs (02/28/2022)   PRAPARE -  Hydrologist (Medical): No    Lack of Transportation (Non-Medical): No  Physical Activity: Insufficiently Active (07/05/2018)   Exercise Vital Sign    Days of Exercise per Week: 2 days    Minutes of Exercise per Session: 30 min  Stress: No Stress Concern Present (07/05/2018)   [place]    Feeling of Stress : Not at all  Social Connections: Somewhat Isolated (07/05/2018)   Social Connection and Isolation Panel [NHANES]    Frequency of Communication with Friends and Family: More than three times a week    Frequency of Social Gatherings with Friends and Family: More than three times a week    Attends Religious Services: 1 to 4 times per year    Active Member of [employer] or Organizations: No    Attends Archivist Meetings: Never  Marital Status: Divorced  Human resources officer Violence: Not At Risk (07/05/2018)   Humiliation, Afraid, Rape, and Kick questionnaire    Fear of Current or Ex-Partner: No    Emotionally Abused: No    Physically Abused: No    Sexually Abused: No    Family History  Problem Relation Age of Onset   Breast cancer Mother 59   Cancer Mother        Breast    Cirrhosis Father    Breast cancer Paternal Aunt 67   Cancer Maternal Aunt        Breast       PHYSICAL EXAM General: elderly and chronically ill appearing black female, in no acute distress. Sitting upright in ED stretcher with nurse at bedside  HEENT:  Normocephalic and atraumatic. Neck:  No JVD.  Lungs: Normal respiratory effort on 3L by [place]. Bibasilar crackles L worse than R with trace expiratory wheezes Heart: HRRR . Normal S1 and S2. 3/6 systolic murmur best heard at RUSB.   Abdomen: Non-distended appearing.  Msk: Normal strength and tone for age. Extremities: Warm and well perfused. No clubbing, cyanosis. No peripheral edema.  Neuro: Alert and oriented X 3. Psych:  Answers questions appropriately.    Labs:   Lab Results  Component Value Date   WBC 4.6 05/23/2022   HGB 8.0 (L) 05/23/2022   HCT 23.9 (L) 05/23/2022   MCV 96.4 05/23/2022   PLT 249 05/23/2022    Recent Labs  Lab 05/23/22 0554  NA 137  K 3.8  CL 104  CO2 26  BUN 38*  CREATININE 2.44*  CALCIUM 8.9  PROT 6.4*  BILITOT 1.0  ALKPHOS 56  ALT 16  AST 29  GLUCOSE 81   Lab Results  Component Value Date   CKTOTAL 207 06/26/2013   CKMB 1.9 06/27/2013   TROPONINI <0.03 12/24/2018    Lab Results  Component Value Date   CHOL 152 01/21/2020   Lab Results  Component Value Date   HDL 87 01/21/2020   Lab Results  Component Value Date   LDLCALC 53 01/21/2020   Lab Results  Component Value Date   TRIG 95 11/06/2021   TRIG 57 11/02/2021   TRIG 60 01/21/2020   Lab Results  Component Value Date   CHOLHDL 1.7 01/21/2020   No results found for: "LDLDIRECT"    Radiology: US THORACENTESIS ASP PLEURAL SPACE W/IMG GUIDE  Result Date: 05/23/2022 INDICATION: Patient with history of known atrial fibrillation, CKD, CHF and history of adenocarcinoma of right lung status post radiation. Patient has recurrent right pleural effusion. Request for therapeutic right thoracentesis. EXAM: ULTRASOUND GUIDED THERAPEUTIC RIGHT THORACENTESIS MEDICATIONS: 10 mL 1 % lidocaine COMPLICATIONS: None immediate. PROCEDURE: An ultrasound guided thoracentesis was thoroughly discussed with the patient and questions answered. The benefits, risks, alternatives and complications were also discussed. The patient understands and wishes to proceed with the procedure. Written consent was obtained. Ultrasound was performed to localize and mark an adequate pocket of fluid in the right chest. The area was then prepped and draped in the normal sterile fashion. 1% Lidocaine was used for local anesthesia. Under ultrasound guidance a 6 Fr Safe-T-Centesis catheter was introduced. Thoracentesis was performed. The catheter was removed and a dressing applied.  FINDINGS: A total of approximately 900 cc of clear, amber fluid was removed. IMPRESSION: Successful ultrasound guided right thoracentesis yielding 900 cc of pleural fluid. Read by: Narda Rutherford, AGNP-BC Electronically Signed   By: Albin Felling M.D.   On: 05/23/2022  08:49   DG Chest Port 1 View  Result Date: 05/22/2022 CLINICAL DATA:  Right-sided thoracentesis. EXAM: PORTABLE CHEST 1 VIEW COMPARISON:  Earlier same day. FINDINGS: Significant interval improvement patient's right-sided effusion with only small amount of residual right pleural fluid noted. Patient is post right-sided thoracentesis. No evidence of pneumothorax. Stable density over the right mid to upper lung as well as stable prominence of the right hilum. Left lung is clear. Cardiomediastinal silhouette and remainder of the exam is unchanged. IMPRESSION: 1. Significant interval improvement in patient's right-sided pleural effusion post thoracentesis. No pneumothorax. Minimal residual right pleural fluid. 2. Stable changes over the right mid to upper lung and stable prominence of the right hilum. Electronically Signed   By: Marin Olp M.D.   On: 05/22/2022 17:58   DG Chest Portable 1 View  Result Date: 05/22/2022 CLINICAL DATA:  SOB EXAM: PORTABLE CHEST 1 VIEW COMPARISON:  Chest x-ray 05/08/2022, CT chest 02/21/2022 FINDINGS: The heart and mediastinal contours are unchanged. Atherosclerotic plaque. No focal consolidation. Chronic coarsened markings with no overt pulmonary edema. Interval increase in a small to moderate volume right pleural effusion. No pneumothorax. No acute osseous abnormality. IMPRESSION: 1. Interval increase in a small to moderate right pleural effusion. 2. Aortic Atherosclerosis (ICD10-I70.0) and Emphysema (ICD10-J43.9). Electronically Signed   By: Iven Finn M.D.   On: 05/22/2022 16:25   DG Chest Port 1 View  Result Date: 05/08/2022 CLINICAL DATA:  Status post thoracentesis. EXAM: PORTABLE CHEST 1 VIEW COMPARISON:   Chest x-ray, same date. FINDINGS: Interval evacuation of the right pleural fluid collection. No postprocedural pneumothorax. IMPRESSION: Status post right thoracentesis. No residual pleural fluid and no pneumothorax. Electronically Signed   By: Marijo Sanes M.D.   On: 05/08/2022 12:18   US THORACENTESIS ASP PLEURAL SPACE W/IMG GUIDE  Result Date: 05/08/2022 INDICATION: Patient with recurrent right pleural effusion, request received for diagnostic and therapeutic thoracentesis. Patient with known atrial fibrillation, chronic kidney disease, history of adenocarcinoma of the right lung status post radiation and congestive heart failure. EXAM: ULTRASOUND GUIDED RIGHT THORACENTESIS MEDICATIONS: Local 1% lidocaine only. COMPLICATIONS: None immediate. PROCEDURE: An ultrasound guided thoracentesis was thoroughly discussed with the patient and questions answered. The benefits, risks, alternatives and complications were also discussed. The patient understands and wishes to proceed with the procedure. Written consent was obtained. Ultrasound was performed to localize and mark an adequate pocket of fluid in the right chest. The area was then prepped and draped in the normal sterile fashion. 1% Lidocaine was used for local anesthesia. Under ultrasound guidance a 19 gauge, 7-cm, Yueh catheter was introduced. Thoracentesis was performed. The catheter was removed and a dressing applied. FINDINGS: A total of approximately 1.2 L of clear yellow fluid was removed. Samples were sent to the laboratory as requested by the clinical team. IMPRESSION: Successful ultrasound guided right thoracentesis yielding 1.2 L of pleural fluid. This exam was performed by Tsosie Billing PA-C, and was supervised and interpreted by Dr. Kathlene Cote. Electronically Signed   By: Aletta Edouard M.D.   On: 05/08/2022 12:10   CT ABDOMEN PELVIS WO CONTRAST  Result Date: 05/08/2022 CLINICAL DATA:  Acute non localized abdominal pain for several days.  Anorexia. EXAM: CT ABDOMEN AND PELVIS WITHOUT CONTRAST TECHNIQUE: Multidetector CT imaging of the abdomen and pelvis was performed following the standard protocol without IV contrast. RADIATION DOSE REDUCTION: This exam was performed according to the departmental dose-optimization program which includes automated exposure control, adjustment of the mA and/or kV according to patient size  and/or use of iterative reconstruction technique. COMPARISON:  04/13/2022 FINDINGS: Lower chest: Moderate right pleural effusion, without significant change. Hepatobiliary: Small hepatic cysts remains stable. No new or enlarging liver lesions seen on this unenhanced exam. High attenuation gallbladder sludge again noted, however there is no evidence of cholecystitis or biliary ductal dilatation. Pancreas: No mass or inflammatory process visualized on this unenhanced exam. Spleen:  Within normal limits in size. Adrenals/Urinary tract: No evidence of urolithiasis or hydronephrosis. Unremarkable unopacified urinary bladder. Stomach/Bowel: No evidence of obstruction, inflammatory process, or abnormal fluid collections. Diverticulosis is seen mainly involving the sigmoid colon, however there is no evidence of diverticulitis. Vascular/Lymphatic: No pathologically enlarged lymph nodes identified. No evidence of abdominal aortic aneurysm. Aortic atherosclerotic calcification incidentally noted. Reproductive: No mass or other significant abnormality. Bilateral tubal ligation clips are noted. Other:  None. Musculoskeletal:  No suspicious bone lesions identified. IMPRESSION: No evidence of urolithiasis, hydronephrosis, or other acute findings. Colonic diverticulosis, without radiographic evidence of diverticulitis. Stable moderate right pleural effusion. Electronically Signed   By: Marlaine Hind M.D.   On: 05/08/2022 09:12   CT HEAD WO CONTRAST (5MM)  Result Date: 05/08/2022 CLINICAL DATA:  Dizziness, persistent/recurrent, cardiac or vascular  cause suspected EXAM: CT HEAD WITHOUT CONTRAST TECHNIQUE: Contiguous axial images were obtained from the base of the skull through the vertex without intravenous contrast. RADIATION DOSE REDUCTION: This exam was performed according to the departmental dose-optimization program which includes automated exposure control, adjustment of the mA and/or kV according to patient size and/or use of iterative reconstruction technique. COMPARISON:  01/29/2022 FINDINGS: Brain: No evidence of acute infarction, hemorrhage, hydrocephalus, extra-axial collection or mass lesion/mass effect. Scattered low-density changes within the periventricular and subcortical white matter compatible with chronic microvascular ischemic change. Mild diffuse cerebral volume loss. Vascular: Atherosclerotic calcifications involving the large vessels of the skull base. No unexpected hyperdense vessel. Skull: Normal. Negative for fracture or focal lesion. Sinuses/Orbits: No acute finding. Other: None. IMPRESSION: 1. No acute intracranial abnormality. 2. Mild chronic microvascular ischemic change and cerebral volume loss. Electronically Signed   By: Davina Poke D.O.   On: 05/08/2022 09:10   DG Chest Portable 1 View  Result Date: 05/08/2022 CLINICAL DATA:  Shortness of breath EXAM: PORTABLE CHEST 1 VIEW COMPARISON:  Chest x-ray dated April 30, 2021 FINDINGS: Cardiac and mediastinal contours are unchanged. Background emphysema. Stable right perihilar and right upper lobe nodular opacities. No new parenchymal opacity. Stable small right pleural effusion. No evidence of pneumothorax. IMPRESSION: Stable small right pleural effusion and right lung nodular opacities. Electronically Signed   By: Yetta Glassman M.D.   On: 05/08/2022 08:34   DG Chest 2 View  Result Date: 04/30/2022 CLINICAL DATA:  Chest pressure. EXAM: CHEST - 2 VIEW COMPARISON:  Chest radiograph dated 04/13/2022. FINDINGS: Slight improvement of vascular congestion since the prior  radiograph. Similar appearance of right pleural effusion and right lung base atelectasis. Pneumonia is not excluded. The left lung is clear. No pneumothorax. Mild cardiomegaly. Atherosclerotic calcification of the aorta. No acute osseous pathology. IMPRESSION: 1. Slight improvement of vascular congestion. 2. Similar appearance of right pleural effusion and right lung base atelectasis. Electronically Signed   By: Anner Crete M.D.   On: 04/30/2022 01:20    ECHO 01/25/2022 IMPRESSIONS     1. Ant/apical/setal hpo.   2. Left ventricular ejection fraction, by estimation, is 35 to 40%. The  left ventricle has moderately decreased function. The left ventricle  demonstrates regional wall motion abnormalities (see scoring  diagram/findings for  description). The left  ventricular internal cavity size was mildly dilated. Left ventricular  diastolic parameters are consistent with Grade II diastolic dysfunction  (pseudonormalization).   3. Right ventricular systolic function is low normal. The right  ventricular size is normal.   4. The mitral valve is normal in structure. Moderate mitral valve  regurgitation.   5. Tricuspid valve regurgitation is moderate to severe.   6. The aortic valve is normal in structure. Aortic valve regurgitation is  not visualized. Moderate aortic valve stenosis.   FINDINGS   Left Ventricle: Left ventricular ejection fraction, by estimation, is 35  to 40%. The left ventricle has moderately decreased function. The left  ventricle demonstrates regional wall motion abnormalities. The left  ventricular internal cavity size was  mildly dilated. There is borderline concentric left ventricular  hypertrophy. Left ventricular diastolic parameters are consistent with  Grade II diastolic dysfunction (pseudonormalization).   Right Ventricle: The right ventricular size is normal. No increase in  right ventricular wall thickness. Right ventricular systolic function is  low  normal.   Left Atrium: Left atrial size was normal in size.   Right Atrium: Right atrial size was not assessed.   Pericardium: There is no evidence of pericardial effusion.   Mitral Valve: The mitral valve is normal in structure. Moderate mitral  valve regurgitation.   Tricuspid Valve: The tricuspid valve is grossly normal. Tricuspid valve  regurgitation is moderate to severe.   Aortic Valve: The aortic valve is normal in structure. Aortic valve  regurgitation is not visualized. Moderate aortic stenosis is present.  Aortic valve mean gradient measures 6.3 mmHg. Aortic valve peak gradient  measures 11.6 mmHg. Aortic valve area, by  VTI measures 0.80 cm.   Pulmonic Valve: The pulmonic valve was normal in structure. Pulmonic valve  regurgitation is trivial.   Aorta: The ascending aorta was not well visualized.   IAS/Shunts: No atrial level shunt detected by color flow Doppler.   Additional Comments: Ant/apical/setal hpo.      LEFT VENTRICLE  PLAX 2D  LVIDd:         3.90 cm  LVIDs:         3.00 cm  LV PW:         1.30 cm  LV IVS:        0.85 cm  LVOT diam:     2.00 cm  LV SV:         20  LV SV Index:   12  LVOT Area:     3.14 cm     LV Volumes (MOD)  LV vol d, MOD A2C: 35.6 ml  LV vol d, MOD A4C: 62.7 ml  LV vol s, MOD A2C: 23.2 ml  LV vol s, MOD A4C: 42.8 ml  LV SV MOD A2C:     12.4 ml  LV SV MOD A4C:     62.7 ml  LV SV MOD BP:      18.0 ml   RIGHT VENTRICLE  RV Basal diam:  3.70 cm  RV S prime:     10.20 cm/s  TAPSE (M-mode): 1.1 cm   LEFT ATRIUM           Index        RIGHT ATRIUM           Index  LA diam:      3.20 cm 2.00 cm/m   RA Area:     21.70 cm  LA Vol (A2C): 48.6 ml 30.37 ml/m  RA Volume:   71.60 ml  44.74 ml/m  LA Vol (A4C): 15.7 ml 9.81 ml/m   AORTIC VALVE                     PULMONIC VALVE  AV Area (Vmax):    0.81 cm      PV Vmax:        0.39 m/s  AV Area (Vmean):   0.84 cm      PV Vmean:       27.500 cm/s  AV Area (VTI):     0.80 cm       PV VTI:         0.051 m  AV Vmax:           170.00 cm/s   PV Peak grad:   0.6 mmHg  AV Vmean:          114.667 cm/s  PV Mean grad:   0.0 mmHg  AV VTI:            0.247 m       RVOT Peak grad: 2 mmHg  AV Peak Grad:      11.6 mmHg  AV Mean Grad:      6.3 mmHg  LVOT Vmax:         43.70 cm/s  LVOT Vmean:        30.800 cm/s  LVOT VTI:          0.063 m  LVOT/AV VTI ratio: 0.25     AORTA  Ao Root diam: 3.00 cm   MITRAL VALVE               TRICUSPID VALVE  MV Area (PHT): 8.34 cm    TR Peak grad:   42.5 mmHg  MV Decel Time: 91 msec     TR Vmax:        326.00 cm/s  MV E velocity: 70.30 cm/s  MV A velocity: 57.00 cm/s  SHUNTS  MV E/A ratio:  1.23        Systemic VTI:  0.06 m                             Systemic Diam: 2.00 cm                             Pulmonic VTI:  0.086 m   Dwayne D Callwood MD  Electronically signed by Yolonda Kida MD  Signature Date/Time: 01/30/2022/1:54:44 PM   TELEMETRY reviewed by me: Periods of tachycardia and what looked like atrial flutter into the 120s, converted to sinus rhythm this morning with rate in the 70s.  EKG reviewed by me: Initially atrial fibrillation rate 104 nonspecific T wave abnormality, repeat 8/9 sinus rhythm rate 74.  ASSESSMENT AND PLAN:  Dhana Totton is a 66 year old female with a past medical history of advanced COPD on 3L O2, paroxysmal atrial fibrillation/flutter (dose reduced eliquis held due to recent epistaxis), HFrEF (LVEF 35-40%, g2dd, mod-sev TR, mod AS, mod MS 01/2022), recurrent Right pleural effusion, CKD (baseline Cr. 2-2.5), remote history of lung cancer s/p radiation, esophageal cancer s/p radiation, MGUS, who presented to [facility] ED 05/22/22 from Dr. Alveria Apley office with worsening shortness of breath. Cardiology is consulted for further assistance.   # Acute on chronic HFrEF (LVEF 35-40%) # Moderate aortic stenosis # Acute on chronic respiratory failure # Recurrent right pleural effusion  The patient has had multiple  admissions over the past year with acute on chronic HFrEF/respiratory failure complicated by advanced COPD, moderate aortic stenosis, and CKD 4.  She says she has not been making as much urine with her home torsemide 40 mg daily but fortunately she feels better and is back to her recent baseline oxygen requirement of 3 L after right thoracentesis yesterday.  Her BNP is markedly elevated at 1900 and she remains with crackles in her lungs bilaterally, worse on the left. -S/p right thoracentesis with 99mL of fluid removed on 8/8 -S/p 40 mg IV Lasix x 2 with minimal urine output per patient.  Will try metolazone 2.5 mg x 1 and monitor response.  -Continue GDMT with metoprolol tartrate 25 mg twice daily as her blood pressure allows -Add Isordil 5 mg 3 times daily and hydralazine 10 mg 3 times daily for further afterload reduction -Defer ACE inhibitor/ARB, spironolactone, Jardiance due to CKD -Strict I's/O, daily weights  # Paroxysmal atrial fibrillation/flutter Was in atrial fibrillation on presentation with heart rate in the 120s, converted to sinus rhythm with rate in the 70s to 80s the morning of 8/9.  Notably, she has been off of dose reduced Eliquis since discharge on 7/29 due to significant epistaxis, cauterized by ENT and was told to follow-up with her regular cardiologist to determine when to restart. -Notably, her hemoglobin dropped from 9.7-8.0 overnight without obvious signs of bleeding -Recommend humidified oxygen if possible while inpatient, and as needed Afrin for maximum of 3 days -decrease amiodarone from $RemoveBeforeD'200mg'ptEZiflPbDYFoq$  BID to $Remo'200mg'VeEat$  once daily  -Will recheck hemoglobin tomorrow morning, if stable recommend restarting Eliquis at 2.5 mg twice daily  # CKD4 Renal function worsening from her baseline on admission, continue to monitor daily with diuresis.  This patient's plan of care was discussed and created with Dr. [name] and he is in agreement.  Signed: Tristan Schroeder , PA-C 05/23/2022,  10:13 AM [facility]  The patient has had significant shortness of breath with pleural effusion acute kidney injury and paroxysmal nonvalvular atrial fibrillation.  Currently her telemetry for which reviewed personally and EKG showed no evidence of atrial fibrillation at this time.  Will we have been concerned about her pleural effusion which is now improved after reviewing her chest x-ray for which I have personally reviewed.  The patient has been on appropriate medication to maintain normal sinus rhythm although concerns about chronic kidney disease and shortness of breath.  Will consider the possibility of additional medication management  .bsign

## 2022-05-23 NOTE — Progress Notes (Signed)
PROGRESS NOTE    Danielle [name]  VOJ:[id number] DOB: 1956/02/07 DOA: 05/22/2022 PCP: [name], PA    Assessment & Plan:   Principal Problem:   SOB (shortness of breath) Active Problems:   Acute on chronic respiratory failure with hypoxia (HCC)   Recurrent right pleural effusion   Acute on chronic combined systolic and diastolic CHF (congestive heart failure) (HCC)   Paroxysmal atrial fibrillation (HCC)   Acute kidney injury superimposed on CKD ([place])   Essential hypertension   GERD (gastroesophageal reflux disease)   Dyspnea  Assessment and Plan: Dyspnea: likely secondary to recurrent right pleural effusion. S/p right thoracentesis.    Recurrent right pleural effusion: s/p right thoracentesis. Continue w/ supportive care    Acute on chronic hypoxic respiratory failure: continue on supplemental oxygen. Initially on BiPAP but has since been weaned off. Continue on 2L [place] and usually uses 3-4L [place] at home    PAF: continue on metoprolol, amio    Acute on chronic combined CHF: continue on metoprolol, statin. Metolazone x 1. Holding torsemide. Cardio following and recs apprec    AKI on CKDIV: Cr is trending up. Avoid nephrotoxic meds   ACD: likely secondary to CKD. No need for a transfusion currently   HTN: continue on metoprolol. Holding torsemide    GERD: continue on PPI   Hx of lung & esophageal cancer: management as per onco outpatient   Depression: severity unknown. Continue on home dose of citalopram    DVT prophylaxis: heparin  Code Status: full  Family Communication:  Disposition Plan: likely d/c back home   Level of care: Telemetry Cardiac  Status is: Inpatient Remains inpatient appropriate because: severity of illness    Consultants:    Procedures:   Antimicrobials:   Subjective: Pt c/o malaise   Objective: Vitals:   05/23/22 0500 05/23/22 0600 05/23/22 0700 05/23/22 0805  BP: 121/74 107/65 114/71   Pulse: 64 73 64   Resp: (!) 24 (!) 23 17    Temp:    98.2 F (36.8 C)  TempSrc:    Oral  SpO2: 100% 92% 100%   Weight:      Height:       No intake or output data in the 24 hours ending 05/23/22 0923 Filed Weights   05/22/22 1526  Weight: 51 kg    Examination:  General exam: Appears calm and comfortable  Respiratory system: decreased breath sounds b/l  Cardiovascular system: S1 & S2 +. No  rubs, gallops or clicks.  Gastrointestinal system: Abdomen is nondistended, soft and nontender. Normal bowel sounds heard. Central nervous system: Alert and oriented. Moves all extremities  Psychiatry: Judgement and insight appear normal.Flat mood and affect .     Data Reviewed: I have personally reviewed following labs and imaging studies  CBC: Recent Labs  Lab 05/22/22 1605 05/23/22 0554  WBC 7.7 4.6  NEUTROABS 6.2  --   HGB 9.7* 8.0*  HCT 30.1* 23.9*  MCV 99.0 96.4  PLT 321 993   Basic Metabolic Panel: Recent Labs  Lab 05/22/22 1605 05/23/22 0554  NA 138 137  K 4.1 3.8  CL 100 104  CO2 20* 26  GLUCOSE 62* 81  BUN 37* 38*  CREATININE 2.29* 2.44*  CALCIUM 9.8 8.9   GFR: Estimated Creatinine Clearance: 17.8 mL/min (A) (by C-G formula based on SCr of 2.44 mg/dL (H)). Liver Function Tests: Recent Labs  Lab 05/22/22 1605 05/23/22 0554  AST 58* 29  ALT 29 16  ALKPHOS 73 56  BILITOT 1.4* 1.0  PROT 8.5* 6.4*  ALBUMIN 4.3 3.3*   No results for input(s): "LIPASE", "AMYLASE" in the last 168 hours. No results for input(s): "AMMONIA" in the last 168 hours. Coagulation Profile: No results for input(s): "INR", "PROTIME" in the last 168 hours. Cardiac Enzymes: No results for input(s): "CKTOTAL", "CKMB", "CKMBINDEX", "TROPONINI" in the last 168 hours. BNP (last 3 results) No results for input(s): "PROBNP" in the last 8760 hours. HbA1C: No results for input(s): "HGBA1C" in the last 72 hours. CBG: Recent Labs  Lab 05/23/22 0559  GLUCAP 82   Lipid Profile: No results for input(s): "CHOL", "HDL", "LDLCALC",  "TRIG", "CHOLHDL", "LDLDIRECT" in the last 72 hours. Thyroid Function Tests: No results for input(s): "TSH", "T4TOTAL", "FREET4", "T3FREE", "THYROIDAB" in the last 72 hours. Anemia Panel: No results for input(s): "VITAMINB12", "FOLATE", "FERRITIN", "TIBC", "IRON", "RETICCTPCT" in the last 72 hours. Sepsis Labs: Recent Labs  Lab 05/22/22 2207  PROCALCITON <0.10    Recent Results (from the past 240 hour(s))  SARS Coronavirus 2 by RT PCR (hospital order, performed in [facility] hospital lab) *cepheid single result test* Anterior Nasal Swab     Status: None   Collection Time: 05/22/22  9:55 PM   Specimen: Anterior Nasal Swab  Result Value Ref Range Status   SARS Coronavirus 2 by RT PCR NEGATIVE NEGATIVE Final    Comment: (NOTE) SARS-CoV-2 target nucleic acids are NOT DETECTED.  The SARS-CoV-2 RNA is generally detectable in upper and lower respiratory specimens during the acute phase of infection. The lowest concentration of SARS-CoV-2 viral copies this assay can detect is 250 copies / mL. A negative result does not preclude SARS-CoV-2 infection and should not be used as the sole basis for treatment or other patient management decisions.  A negative result may occur with improper specimen collection / handling, submission of specimen other than nasopharyngeal swab, presence of viral mutation(s) within the areas targeted by this assay, and inadequate number of viral copies (<250 copies / mL). A negative result must be combined with clinical observations, patient history, and epidemiological information.  Fact Sheet for Patients:   https://www.patel.info/  Fact Sheet for Healthcare Providers: https://hall.com/  This test is not yet approved or  cleared by the [place] FDA and has been authorized for detection and/or diagnosis of SARS-CoV-2 by FDA under an Emergency Use Authorization (EUA).  This EUA will remain in effect (meaning this  test can be used) for the duration of the COVID-19 declaration under Section 564(b)(1) of the Act, 21 U.S.C. section 360bbb-3(b)(1), unless the authorization is terminated or revoked sooner.  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 Patient has recurrent right pleural effusion. Request for therapeutic right thoracentesis. EXAM: ULTRASOUND GUIDED THERAPEUTIC RIGHT THORACENTESIS MEDICATIONS: 10 mL 1 % lidocaine COMPLICATIONS: None immediate. PROCEDURE: An ultrasound guided thoracentesis was thoroughly discussed with the patient and questions answered. The benefits, risks, alternatives and complications were also discussed. The patient understands and wishes to proceed with the procedure. Written consent was obtained. Ultrasound was performed to localize and mark an adequate pocket of fluid in the right chest. The area was then prepped and draped in the normal sterile fashion. 1% Lidocaine was used for local anesthesia. Under ultrasound guidance a  6 Fr Safe-T-Centesis catheter was introduced. Thoracentesis was performed. The catheter was removed and a dressing applied. FINDINGS: A total of approximately 900 cc of clear, amber fluid was removed. IMPRESSION: Successful ultrasound guided right thoracentesis yielding 900 cc of pleural fluid. Read by: Narda Rutherford, AGNP-BC Electronically Signed   By: Albin Felling M.D.   On: 05/23/2022 08:49   DG Chest Port 1 View  Result Date: 05/22/2022 CLINICAL DATA:  Right-sided thoracentesis. EXAM: PORTABLE CHEST 1 VIEW COMPARISON:  Earlier same day. FINDINGS: Significant interval improvement patient's right-sided effusion with only small amount of residual right pleural fluid noted. Patient is  post right-sided thoracentesis. No evidence of pneumothorax. Stable density over the right mid to upper lung as well as stable prominence of the right hilum. Left lung is clear. Cardiomediastinal silhouette and remainder of the exam is unchanged. IMPRESSION: 1. Significant interval improvement in patient's right-sided pleural effusion post thoracentesis. No pneumothorax. Minimal residual right pleural fluid. 2. Stable changes over the right mid to upper lung and stable prominence of the right hilum. Electronically Signed   By: Marin Olp M.D.   On: 05/22/2022 17:58   DG Chest Portable 1 View  Result Date: 05/22/2022 CLINICAL DATA:  SOB EXAM: PORTABLE CHEST 1 VIEW COMPARISON:  Chest x-ray 05/08/2022, CT chest 02/21/2022 FINDINGS: The heart and mediastinal contours are unchanged. Atherosclerotic plaque. No focal consolidation. Chronic coarsened markings with no overt pulmonary edema. Interval increase in a small to moderate volume right pleural effusion. No pneumothorax. No acute osseous abnormality. IMPRESSION: 1. Interval increase in a small to moderate right pleural effusion. 2. Aortic Atherosclerosis (ICD10-I70.0) and Emphysema (ICD10-J43.9). Electronically Signed   By: Iven Finn M.D.   On: 05/22/2022 16:25        Scheduled Meds:  amiodarone  200 mg Oral BID   atorvastatin  40 mg Oral Daily   citalopram  20 mg Oral Daily   dextrose  1 ampule Intravenous Once   fluticasone furoate-vilanterol  1 puff Inhalation Daily   And   umeclidinium bromide  1 puff Inhalation Daily   heparin  5,000 Units Subcutaneous Q8H   metoprolol tartrate  25 mg Oral BID   sodium chloride flush  3 mL Intravenous Q12H   Continuous Infusions:   LOS: 1 day    Time spent: 35 mins    Wyvonnia Dusky, MD [facility] Pager [phone]  If 7PM-7AM, please contact night-coverage [email or web address] 05/23/2022, 9:23 AM

## 2022-05-24 DIAGNOSIS — N189 Chronic kidney disease, unspecified: Secondary | ICD-10-CM | POA: Diagnosis not present

## 2022-05-24 DIAGNOSIS — N179 Acute kidney failure, unspecified: Secondary | ICD-10-CM | POA: Diagnosis not present

## 2022-05-24 DIAGNOSIS — I480 Paroxysmal atrial fibrillation: Secondary | ICD-10-CM | POA: Diagnosis not present

## 2022-05-24 DIAGNOSIS — I5043 Acute on chronic combined systolic (congestive) and diastolic (congestive) heart failure: Secondary | ICD-10-CM | POA: Diagnosis not present

## 2022-05-24 LAB — CBC
HCT: 25.4 % — ABNORMAL LOW (ref 36.0–46.0)
Hemoglobin: 8.5 g/dL — ABNORMAL LOW (ref 12.0–15.0)
MCH: 32.2 pg (ref 26.0–34.0)
MCHC: 33.5 g/dL (ref 30.0–36.0)
MCV: 96.2 fL (ref 80.0–100.0)
Platelets: 251 10*3/uL (ref 150–400)
RBC: 2.64 MIL/uL — ABNORMAL LOW (ref 3.87–5.11)
RDW: 14.9 % (ref 11.5–15.5)
WBC: 5.5 10*3/uL (ref 4.0–10.5)
nRBC: 1.1 % — ABNORMAL HIGH (ref 0.0–0.2)

## 2022-05-24 LAB — BASIC METABOLIC PANEL
Anion gap: 10 (ref 5–15)
BUN: 36 mg/dL — ABNORMAL HIGH (ref 8–23)
CO2: 25 mmol/L (ref 22–32)
Calcium: 8.7 mg/dL — ABNORMAL LOW (ref 8.9–10.3)
Chloride: 101 mmol/L (ref 98–111)
Creatinine, Ser: 2.54 mg/dL — ABNORMAL HIGH (ref 0.44–1.00)
GFR, Estimated: 20 mL/min — ABNORMAL LOW (ref >60)
Glucose, Bld: 88 mg/dL (ref 70–99)
Potassium: 3.3 mmol/L — ABNORMAL LOW (ref 3.5–5.1)
Sodium: 136 mmol/L (ref 135–145)

## 2022-05-24 MED ORDER — LOPERAMIDE HCL 2 MG PO CAPS
2.0000 mg | ORAL_CAPSULE | Freq: Three times a day (TID) | ORAL | Status: DC | PRN
  Administered 2022-05-24 (×2): 2 mg via ORAL
  Filled 2022-05-24 (×3): qty 1

## 2022-05-24 MED ORDER — MELATONIN 5 MG PO TABS
5.0000 mg | ORAL_TABLET | Freq: Once | ORAL | Status: AC
  Administered 2022-05-24: 5 mg via ORAL
  Filled 2022-05-24: qty 1

## 2022-05-24 MED ORDER — NEPRO/CARBSTEADY PO LIQD
237.0000 mL | Freq: Two times a day (BID) | ORAL | Status: DC
  Administered 2022-05-24 – 2022-05-26 (×4): 237 mL via ORAL

## 2022-05-24 MED ORDER — APIXABAN 2.5 MG PO TABS
2.5000 mg | ORAL_TABLET | Freq: Two times a day (BID) | ORAL | Status: DC
  Administered 2022-05-24 – 2022-05-26 (×4): 2.5 mg via ORAL
  Filled 2022-05-24 (×4): qty 1

## 2022-05-24 MED ORDER — SIMETHICONE 80 MG PO CHEW: 80.0000 mg | CHEWABLE_TABLET | Freq: Four times a day (QID) | ORAL | Status: DC | PRN

## 2022-05-24 MED ORDER — ADULT MULTIVITAMIN W/MINERALS CH
1.0000 | ORAL_TABLET | Freq: Every day | ORAL | Status: DC
Start: 2022-05-24 — End: 2022-05-26
  Administered 2022-05-24 – 2022-05-26 (×3): 1 via ORAL
  Filled 2022-05-24 (×3): qty 1

## 2022-05-24 MED ORDER — POTASSIUM CHLORIDE CRYS ER 20 MEQ PO TBCR
40.0000 meq | EXTENDED_RELEASE_TABLET | Freq: Once | ORAL | Status: AC
Start: 2022-05-24 — End: 2022-05-24
  Administered 2022-05-24: 40 meq via ORAL
  Filled 2022-05-24: qty 2

## 2022-05-24 MED ORDER — MELATONIN 5 MG PO TABS
2.5000 mg | ORAL_TABLET | Freq: Every day | ORAL | Status: DC
Start: 2022-05-24 — End: 2022-05-26
  Administered 2022-05-24 – 2022-05-25 (×2): 2.5 mg via ORAL
  Filled 2022-05-24 (×2): qty 1

## 2022-05-24 MED ORDER — MELATONIN 5 MG PO TABS
2.5000 mg | ORAL_TABLET | Freq: Every day | ORAL | Status: DC
  Filled 2022-05-24: qty 0.5

## 2022-05-24 NOTE — TOC Initial Note (Signed)
Transition of Care [facility]) - Initial/Assessment Note    Patient Details  Name: Danielle [name] MRN: [id number] Date of Birth: 1955-12-09  Transition of [facility] CM/SW Contact:    Alberteen Sam, LCSW Phone Number: 05/24/2022, 9:55 AM  Clinical Narrative:                  Patient known to [facility] due to readmissions, Readmission risk completed per chart review.   Patient from home with son and granddaughter. PCP is Daryel Gerald. Patient has all dme at home including Home O2 through [employer], RW, BSC, crutches and wheel chair.    Patient uses [facility] transport to appointments.  Daughter will transport at discharge   [facility] will follow for additional dc needs.   Expected Discharge Plan: Home/Self Care Barriers to Discharge: Continued Medical Work up   Patient Goals and CMS Choice Patient states their goals for this hospitalization and ongoing recovery are:: to go home CMS Medicare.gov Compare Post Acute Care list provided to:: Patient Choice offered to / list presented to : Patient  Expected Discharge Plan and Services Expected Discharge Plan: Home/Self Care       Living arrangements for the past 2 months: Single Family Home                                      Prior Living Arrangements/Services Living arrangements for the past 2 months: Single Family Home Lives with:: Self                   Activities of Daily Living Home Assistive Devices/Equipment: Eyeglasses, Dentures (specify type) ADL Screening (condition at time of admission) Patient's cognitive ability adequate to safely complete daily activities?: Yes Is the patient deaf or have difficulty hearing?: No Does the patient have difficulty seeing, even when wearing glasses/contacts?: No Does the patient have difficulty concentrating, remembering, or making decisions?: No Patient able to express need for assistance with ADLs?: Yes Does the patient have difficulty dressing or bathing?: No Independently performs  ADLs?: No Communication: Independent Dressing (OT): Needs assistance Is this a change from baseline?: Pre-admission baseline Grooming: Needs assistance Is this a change from baseline?: Pre-admission baseline Feeding: Independent Bathing: Needs assistance Is this a change from baseline?: Change from baseline, expected to last <3 days Toileting: Independent In/Out Bed: Independent Walks in Home: Independent Does the patient have difficulty walking or climbing stairs?: No Weakness of Legs: Both Weakness of Arms/Hands: None  Permission Sought/Granted                  Emotional Assessment       Orientation: : Oriented to Self, Oriented to Place, Oriented to  Time, Oriented to Situation Alcohol / Substance Use: Not Applicable Psych Involvement: No (comment)  Admission diagnosis:  Dyspnea [R06.00] SOB (shortness of breath) [R06.02] Acute on chronic respiratory failure with hypoxemia (HCC) [J96.21] Recurrent pleural effusion [J90] Patient Active Problem List   Diagnosis Date Noted   Dyspnea 05/23/2022   SOB (shortness of breath) 05/22/2022   Chronic respiratory failure with hypoxia (HCC)    Recurrent right pleural effusion 05/08/2022   Paroxysmal atrial fibrillation ([place]) 03/10/2022   Dyslipidemia 03/10/2022   Essential hypertension 03/10/2022   COPD, severe ([place]) 03/10/2022   Atrial fibrillation with RVR ([place]) 02/22/2022   Sore throat 02/02/2022   TIA (transient ischemic attack) 01/29/2022   Epistaxis 01/27/2022   Pleural effusion 01/26/2022  Known medical problems 01/26/2022   C. difficile colitis 11/27/2021   Abdominal pain 11/26/2021   HLD (hyperlipidemia) 11/26/2021   Chronic diastolic CHF (congestive heart failure) ([place]) 11/26/2021   Iron deficiency anemia 11/26/2021   Aspiration pneumonia of right lower lobe due to gastric secretions (HCC)    Ileus following gastrointestinal surgery ([place])    Hypomagnesemia    Anemia of chronic disease    Hyperphosphatemia  10/24/2021   Hypotension 11/17/2020   Ileostomy status ([place] ) 11/17/2020   Acute shoulder pain due to trauma, left 11/17/2020   Syncope 11/17/2020   Chronic obstructive pulmonary disease ([place]) 08/01/2020   Cellulitis of left arm 01/20/2020   CKD (chronic kidney disease), stage IV (HCC) 01/20/2020   Hypokalemia 01/20/2020   Elevated troponin 01/20/2020   Sepsis ([place]) 01/20/2020   Acute on chronic combined systolic and diastolic CHF (congestive heart failure) ([place]) 01/20/2020   Pleural effusion due to CHF (congestive heart failure) ([place]) 11/06/2019   Diarrhea    GERD (gastroesophageal reflux disease) 10/29/2019   Depression 10/29/2019   Acute blood loss anemia 06/20/2019   Normocytic anemia 06/20/2019   GI bleeding 06/19/2019   Anemia of chronic kidney failure, stage 4 (severe) ([place]) 06/12/2019   Malignant neoplasm of lower third of esophagus ([place]) 05/01/2019   Acute on chronic respiratory failure with hypoxia and hypercapnia (HCC) 12/22/2018   Ileostomy dysfunction ([place]) 09/28/2018   Reflux esophagitis    Iron deficiency anemia secondary to blood loss (chronic)    SBO (small bowel obstruction) ([place]) 06/29/2018   Atrial flutter ([place]) 06/09/2018   Lymphedema 06/09/2018   Acute kidney injury superimposed on CKD ([place]) 06/04/2018   Severe protein-calorie malnutrition ([place]) 06/04/2018   Blood in stool    Focal (segmental) acute (reversible) ischemia of large intestine (HCC)    Ulceration of intestine    Diverticulosis of large intestine without diverticulitis    Abnormal CT scan, colon    Colitis    Malnutrition of moderate degree 04/23/2018   Palliative care by specialist    DNR (do not resuscitate) discussion    Weakness    Acute on chronic respiratory failure with hypoxia ([place]) 04/21/2018   Hyponatremia 02/10/2017   Syncope and collapse 02/09/2017   Lung nodule, solitary 07/25/2016   Malignant neoplasm of upper lobe of right lung ([place]) 07/25/2016   Carcinoma of overlapping  sites of left breast in female, estrogen receptor positive ([place]) 06/22/2016   Multiple lung nodules 06/22/2016   Lesion of right lung    Breast cancer in female [facility]) 02/21/2016   Moderate COPD (chronic obstructive pulmonary disease) ([place]) 08/24/2014   PCP:  [name], [place] [facility]:   [facility] , [place], [place] - [address] [place] [address] Phone: [phone] Fax: [place], [place] - [place] [address] Phone: [phone] Fax: [phone]     Social Determinants of Health (SDOH) Interventions    Readmission Risk Interventions    05/09/2022    3:11 PM 01/29/2022   11:36 AM 11/29/2021   10:49 AM  Readmission Risk Prevention Plan  Transportation Screening Complete Complete Complete  Medication Review (RN Care Manager) Complete Complete Complete  PCP or Specialist appointment within 3-5 days of discharge  Complete Complete  HRI or Home Care Consult Patient refused  Complete  SW Recovery Care/Counseling Consult Complete Complete Complete  Palliative Care Screening Not Applicable Not Applicable  Not Applicable  Skilled Nursing Facility Not Applicable Not Applicable Not Applicable

## 2022-05-24 NOTE — Progress Notes (Signed)
PROGRESS NOTE    Danielle [name]  OVZ:[id number] DOB: [birth year] DOA: 05/22/2022 PCP: [name], PA    Assessment & Plan:   Principal Problem:   SOB (shortness of breath) Active Problems:   Acute on chronic respiratory failure with hypoxia (HCC)   Recurrent right pleural effusion   Acute on chronic combined systolic and diastolic CHF (congestive heart failure) (HCC)   Paroxysmal atrial fibrillation (HCC)   Acute kidney injury superimposed on CKD ([place])   Essential hypertension   GERD (gastroesophageal reflux disease)   Dyspnea  Assessment and Plan: Dyspnea: likely secondary to recurrent right pleural effusion & CHF. S/p right thoracentesis. C/o shortness of breath today    Recurrent right pleural effusion: improved after right thoracentesis    Acute on chronic hypoxic respiratory failure: continue on supplemental oxygen. Initially on BiPAP but has since been weaned off. Continue on 2L [place] and usually uses 3-4L [place] at home    PAF: continue on amio, metoprolol & restarting eliquis    Acute on chronic combined CHF: continue on metoprolol, statin. Holding all diuretics. Monitor I/Os. Cardio recs apprec    AKI on CKDIV: Cr is trending up again today. Holding all diuretics   ACD: likely secondary to CKD. No need for a transfusion currently   HTN: continue on metoprolol. Holding torsemide    GERD: continue on PPI   Hx of lung & esophageal cancer: management as per onco outpatient   Depression: severity unknown. Continue on home dose of citalopram   DVT prophylaxis: heparin  Code Status: full  Family Communication:  Disposition Plan: likely d/c back home   Level of care: Telemetry Cardiac  Status is: Inpatient Remains inpatient appropriate because: severity of illness    Consultants:    Procedures:   Antimicrobials:   Subjective: Pt c/o shortness of breath   Objective: Vitals:   05/23/22 2042 05/24/22 0006 05/24/22 0521 05/24/22 0722  BP: 111/71 106/71  112/74 102/71  Pulse: 74 69 64 68  Resp: 18 18 18 16   Temp: 97.9 F (36.6 C) 97.6 F (36.4 C) 98.3 F (36.8 C) 97.9 F (36.6 C)  TempSrc:      SpO2: 92% 92% 100% 95%  Weight:   53.1 kg   Height:        Intake/Output Summary (Last 24 hours) at 05/24/2022 0830 Last data filed at 05/23/2022 2100 Gross per 24 hour  Intake 243 ml  Output --  Net 243 ml   Filed Weights   05/22/22 1526 05/24/22 0521  Weight: 51 kg 53.1 kg    Examination:  General exam: Appears calm but uncomfortable  Respiratory system: diminished breath sounds b/l  Cardiovascular system: S1/S2+. No rubs or clicks  Gastrointestinal system: Abd is soft, NT, ND & hyperactive bowel sounds  Central nervous system: alert and oriented. Moves all extremities  Psychiatry: Judgement and insight appears at baseline. Flat mood and affect     Data Reviewed: I have personally reviewed following labs and imaging studies  CBC: Recent Labs  Lab 05/22/22 1605 05/23/22 0554 05/24/22 0643  WBC 7.7 4.6 5.5  NEUTROABS 6.2  --   --   HGB 9.7* 8.0* 8.5*  HCT 30.1* 23.9* 25.4*  MCV 99.0 96.4 96.2  PLT 321 249 412   Basic Metabolic Panel: Recent Labs  Lab 05/22/22 1605 05/23/22 0554 05/24/22 0643  NA 138 137 136  K 4.1 3.8 3.3*  CL 100 104 101  CO2 20* 26 25  GLUCOSE 62* 81 88  BUN 37* 38* 36*  CREATININE 2.29* 2.44* 2.54*  CALCIUM 9.8 8.9 8.7*   GFR: Estimated Creatinine Clearance: 17.8 mL/min (A) (by C-G formula based on SCr of 2.54 mg/dL (H)). Liver Function Tests: Recent Labs  Lab 05/22/22 1605 05/23/22 0554  AST 58* 29  ALT 29 16  ALKPHOS 73 56  BILITOT 1.4* 1.0  PROT 8.5* 6.4*  ALBUMIN 4.3 3.3*   No results for input(s): "LIPASE", "AMYLASE" in the last 168 hours. No results for input(s): "AMMONIA" in the last 168 hours. Coagulation Profile: No results for input(s): "INR", "PROTIME" in the last 168 hours. Cardiac Enzymes: No results for input(s): "CKTOTAL", "CKMB", "CKMBINDEX", "TROPONINI" in  the last 168 hours. BNP (last 3 results) No results for input(s): "PROBNP" in the last 8760 hours. HbA1C: No results for input(s): "HGBA1C" in the last 72 hours. CBG: Recent Labs  Lab 05/23/22 0559 05/23/22 1047  GLUCAP 82 116*   Lipid Profile: No results for input(s): "CHOL", "HDL", "LDLCALC", "TRIG", "CHOLHDL", "LDLDIRECT" in the last 72 hours. Thyroid Function Tests: No results for input(s): "TSH", "T4TOTAL", "FREET4", "T3FREE", "THYROIDAB" in the last 72 hours. Anemia Panel: No results for input(s): "VITAMINB12", "FOLATE", "FERRITIN", "TIBC", "IRON", "RETICCTPCT" in the last 72 hours. Sepsis Labs: Recent Labs  Lab 05/22/22 2207  PROCALCITON <0.10    Recent Results (from the past 240 hour(s))  SARS Coronavirus 2 by RT PCR (hospital order, performed in [facility] hospital lab) *cepheid single result test* Anterior Nasal Swab     Status: None   Collection Time: 05/22/22  9:55 PM   Specimen: Anterior Nasal Swab  Result Value Ref Range Status   SARS Coronavirus 2 by RT PCR NEGATIVE NEGATIVE Final    Comment: (NOTE) SARS-CoV-2 target nucleic acids are NOT DETECTED.  The SARS-CoV-2 RNA is generally detectable in upper and lower respiratory specimens during the acute phase of infection. The lowest concentration of SARS-CoV-2 viral copies this assay can detect is 250 copies / mL. A negative result does not preclude SARS-CoV-2 infection and should not be used as the sole basis for treatment or other patient management decisions.  A negative result may occur with improper specimen collection / handling, submission of specimen other than nasopharyngeal swab, presence of viral mutation(s) within the areas targeted by this assay, and inadequate number of viral copies (<250 copies / mL). A negative result must be combined with clinical observations, patient history, and epidemiological information.  Fact Sheet for Patients:   https://www.patel.info/  Fact  Sheet for Healthcare Providers: https://hall.com/  This test is not yet approved or  cleared by the [place] FDA and has been authorized for detection and/or diagnosis of SARS-CoV-2 by FDA under an Emergency Use Authorization (EUA).  This EUA will remain in effect (meaning this test can be used) for the duration of the COVID-19 declaration under Section 564(b)(1) of the Act, 21 U.S.C. section 360bbb-3(b)(1), unless the authorization is terminated or revoked sooner.  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 Patient has recurrent right pleural effusion. Request for therapeutic right thoracentesis. EXAM: ULTRASOUND GUIDED THERAPEUTIC RIGHT THORACENTESIS MEDICATIONS: 10 mL 1 % lidocaine COMPLICATIONS: None immediate. PROCEDURE: An ultrasound guided thoracentesis was thoroughly discussed with the patient and questions answered. The benefits,  risks, alternatives and complications were also discussed. The patient understands and wishes to proceed with the procedure. Written consent was obtained. Ultrasound was performed to localize and mark an adequate pocket of fluid in the right chest. The area was then prepped and draped in the normal sterile fashion. 1% Lidocaine was used for local anesthesia. Under ultrasound guidance a 6 Fr Safe-T-Centesis catheter was introduced. Thoracentesis was performed. The catheter was removed and a dressing applied. FINDINGS: A total of approximately 900 cc of clear, amber fluid was removed. IMPRESSION: Successful ultrasound guided right thoracentesis yielding 900 cc of pleural fluid. Read by: Narda Rutherford, AGNP-BC Electronically Signed   By: Albin Felling M.D.   On:  05/23/2022 08:49   DG Chest Port 1 View  Result Date: 05/22/2022 CLINICAL DATA:  Right-sided thoracentesis. EXAM: PORTABLE CHEST 1 VIEW COMPARISON:  Earlier same day. FINDINGS: Significant interval improvement patient's right-sided effusion with only small amount of residual right pleural fluid noted. Patient is post right-sided thoracentesis. No evidence of pneumothorax. Stable density over the right mid to upper lung as well as stable prominence of the right hilum. Left lung is clear. Cardiomediastinal silhouette and remainder of the exam is unchanged. IMPRESSION: 1. Significant interval improvement in patient's right-sided pleural effusion post thoracentesis. No pneumothorax. Minimal residual right pleural fluid. 2. Stable changes over the right mid to upper lung and stable prominence of the right hilum. Electronically Signed   By: Marin Olp M.D.   On: 05/22/2022 17:58   DG Chest Portable 1 View  Result Date: 05/22/2022 CLINICAL DATA:  SOB EXAM: PORTABLE CHEST 1 VIEW COMPARISON:  Chest x-ray 05/08/2022, CT chest 02/21/2022 FINDINGS: The heart and mediastinal contours are unchanged. Atherosclerotic plaque. No focal consolidation. Chronic coarsened markings with no overt pulmonary edema. Interval increase in a small to moderate volume right pleural effusion. No pneumothorax. No acute osseous abnormality. IMPRESSION: 1. Interval increase in a small to moderate right pleural effusion. 2. Aortic Atherosclerosis (ICD10-I70.0) and Emphysema (ICD10-J43.9). Electronically Signed   By: Iven Finn M.D.   On: 05/22/2022 16:25        Scheduled Meds:  amiodarone  200 mg Oral Daily   atorvastatin  40 mg Oral Daily   citalopram  20 mg Oral Daily   dextrose  1 ampule Intravenous Once   fluticasone furoate-vilanterol  1 puff Inhalation Daily   And   umeclidinium bromide  1 puff Inhalation Daily   heparin  5,000 Units Subcutaneous Q8H   hydrALAZINE  10 mg Oral TID   isosorbide dinitrate  5 mg Oral TID    lidocaine  1 patch Transdermal Q24H   metoprolol tartrate  25 mg Oral BID   potassium chloride  40 mEq Oral Once   sodium chloride flush  3 mL Intravenous Q12H   Continuous Infusions:   LOS: 2 days    Time spent: 30 mins    Wyvonnia Dusky, MD [facility] Pager [phone]  If 7PM-7AM, please contact night-coverage [email or web address] 05/24/2022, 8:30 AM

## 2022-05-24 NOTE — Progress Notes (Addendum)
[place] NOTE       Patient ID: Danielle [name] MRN: [id number] DOB/AGE: 05/24/1956 66 y.o.  Admit date: 05/22/2022 Referring Physician Dr. Florina Ou Primary Physician [facility] Primary Cardiologist Dr. [name] Reason for Consultation acute on chronic HFrEF  HPI: Danielle [name] is a 66 year old female with a past medical history of advanced COPD on 3L O2, paroxysmal atrial fibrillation/flutter (dose reduced eliquis held due to recent epistaxis), HFrEF (LVEF 35-40%, g2dd, mod-sev TR, mod AS, mod MS 01/2022), recurrent Right pleural effusion, CKD (baseline Cr. 2-2.5), remote history of lung cancer s/p radiation, esophageal cancer s/p radiation, MGUS, who presented to [facility] ED 05/22/22 from Dr. Alveria Apley office with worsening shortness of breath. Cardiology is consulted for further assistance.   Interval History: - not making much urine with metolazone yesterday, Cr continues to worsen  -Did not sleep well last night, feels tired today and somewhat short of breath with ambulation to the restroom -No chest pain, palpitations, epistaxis  Review of systems complete and found to be negative unless listed above   Past Medical History:  Diagnosis Date   Acute respiratory failure due to COVID-19 ([place]) 09/27/2019   Anemia    Anxiety    Aortic atherosclerosis ([place])    Aortic valve stenosis 02/10/2018   a.) TTE 02/10/2018: EF 55-60%: mild AS with MPG of 12 mmHg. b.) TTE 04/21/2018: EF 35-40%; mild AS with MPG 8 mmHg. c.) TTE 11/07/2019: EF 50-55%; mild AS with MPG 10 mmHg.   Arthritis    Atrial fibrillation (HCC)    a.) CHA2DS2-VASc = 4 (age, sex, HTN, aortic plaque). b.) Rate/rhythm maintained on oral carvedilol; chronically anticoagulated on dose reduced apixaban. c.)  Attempted deployment of LAA occlusive device on 01/11/2021; parameters failed and procedure aborted.   Breast cancer, left ([place]) 2000   a.) T2N1M0; ER/PR (+) --> Tx'd with total  mastectomy, LN resection, XRT, and chemotherapy   Cancer of right lung ([place]) 07/30/2016   a.) adenocarcinoma; ALK, ROS1, PDL1, BRAF, EGFR all negative.   Chronic diastolic CHF (congestive heart failure) ([place]) 11/26/2021   CKD (chronic kidney disease), stage IV (HCC)    COPD (chronic obstructive pulmonary disease) (HCC)    Dependence on supplemental oxygen    Depression    Diastolic dysfunction 83/38/2505   a.) TTE 02/10/2017: EF 55-60%; G2DD. b.) TTE 04/21/2018: EF 35-40%; mild LA dilation; mod MV regurgitation. c.) TTE 11/07/2019: EF 50-55%; G1DD.   DOE (dyspnea on exertion)    GERD (gastroesophageal reflux disease)    Heart murmur    History of 2019 novel coronavirus disease (COVID-19) 10/14/2019   HLD (hyperlipidemia)    Hypertension    Long term current use of anticoagulant    a.) apixaban   Lymphedema    Personal history of chemotherapy    Personal history of radiation therapy    Respiratory tract infection due to COVID-19 virus 09/27/2019   SBO (small bowel obstruction) ([place]) 11/05/2020   Vitamin D deficiency     Past Surgical History:  Procedure Laterality Date   Breast Biospy Left    [facility]   BREAST SURGERY     CARDIOVERSION N/A 02/23/2022   Procedure: CARDIOVERSION;  Surgeon: Corey Skains, MD;  Location: [facility] ORS;  Service: Cardiovascular;  Laterality: N/A;   COLONOSCOPY N/A 04/30/2018   Procedure: COLONOSCOPY;  Surgeon: Virgel Manifold, MD;  Location: [facility] ENDOSCOPY;  Service: Endoscopy;  Laterality: N/A;   COLONOSCOPY N/A 07/22/2018   Procedure: COLONOSCOPY;  Surgeon: Bonna Gains,  Lennette Bihari, MD;  Location: [facility] ENDOSCOPY;  Service: Endoscopy;  Laterality: N/A;   COLONOSCOPY WITH PROPOFOL N/A 09/21/2021   Procedure: COLONOSCOPY WITH PROPOFOL;  Surgeon: Benjamine Sprague, DO;  Location: [place] ENDOSCOPY;  Service: General;  Laterality: N/A;   COLONOSCOPY WITH PROPOFOL N/A 03/11/2022   Procedure: COLONOSCOPY WITH PROPOFOL;  Surgeon: Annamaria Helling, DO;  Location:  [facility] ENDOSCOPY;  Service: Gastroenterology;  Laterality: N/A;   DILATION AND CURETTAGE OF UTERUS     ELECTROMAGNETIC NAVIGATION BROCHOSCOPY Right 04/11/2016   Procedure: ELECTROMAGNETIC NAVIGATION BRONCHOSCOPY;  Surgeon: Vilinda Boehringer, MD;  Location: [facility] ORS;  Service: Cardiopulmonary;  Laterality: Right;   ESOPHAGOGASTRODUODENOSCOPY N/A 07/22/2018   Procedure: ESOPHAGOGASTRODUODENOSCOPY (EGD);  Surgeon: Virgel Manifold, MD;  Location: [facility] ENDOSCOPY;  Service: Endoscopy;  Laterality: N/A;   ESOPHAGOGASTRODUODENOSCOPY (EGD) WITH PROPOFOL N/A 05/07/2018   Procedure: ESOPHAGOGASTRODUODENOSCOPY (EGD) WITH PROPOFOL;  Surgeon: Lucilla Lame, MD;  Location: [facility] ENDOSCOPY;  Service: Endoscopy;  Laterality: N/A;   ESOPHAGOGASTRODUODENOSCOPY (EGD) WITH PROPOFOL N/A 04/24/2019   Procedure: ESOPHAGOGASTRODUODENOSCOPY (EGD) WITH PROPOFOL;  Surgeon: Jonathon Bellows, MD;  Location: [facility] ENDOSCOPY;  Service: Gastroenterology;  Laterality: N/A;   ESOPHAGOGASTRODUODENOSCOPY (EGD) WITH PROPOFOL N/A 01/12/2020   Procedure: ESOPHAGOGASTRODUODENOSCOPY (EGD) WITH PROPOFOL;  Surgeon: Jonathon Bellows, MD;  Location: [facility] ENDOSCOPY;  Service: Gastroenterology;  Laterality: N/A;   ESOPHAGOGASTRODUODENOSCOPY (EGD) WITH PROPOFOL N/A 04/28/2020   Procedure: ESOPHAGOGASTRODUODENOSCOPY (EGD) WITH PROPOFOL;  Surgeon: Jonathon Bellows, MD;  Location: [facility] ENDOSCOPY;  Service: Gastroenterology;  Laterality: N/A;   ESOPHAGOGASTRODUODENOSCOPY (EGD) WITH PROPOFOL N/A 03/11/2022   Procedure: ESOPHAGOGASTRODUODENOSCOPY (EGD) WITH PROPOFOL;  Surgeon: Annamaria Helling, DO;  Location: [facility] ENDOSCOPY;  Service: Gastroenterology;  Laterality: N/A;   EUS N/A 05/07/2019   Procedure: FULL UPPER ENDOSCOPIC ULTRASOUND (EUS) RADIAL;  Surgeon: Jola Schmidt, MD;  Location: [facility] ENDOSCOPY;  Service: Endoscopy;  Laterality: N/A;   ILEOSCOPY N/A 07/22/2018   Procedure: ILEOSCOPY THROUGH STOMA;  Surgeon: Virgel Manifold, MD;  Location: [facility]  ENDOSCOPY;  Service: Endoscopy;  Laterality: N/A;   ILEOSTOMY     ILEOSTOMY N/A 09/08/2018   Procedure: ILEOSTOMY REVISION POSSIBLE CREATION;  Surgeon: Herbert Pun, MD;  Location: [facility] ORS;  Service: General;  Laterality: N/A;   ILEOSTOMY CLOSURE N/A 08/15/2018   Procedure: DILATION OF ILEOSTOMY STRICTURE;  Surgeon: Herbert Pun, MD;  Location: [facility] ORS;  Service: General;  Laterality: N/A;   LAPAROTOMY Right 05/04/2018   Procedure: EXPLORATORY LAPAROTOMY right colectomy right and left ostomy;  Surgeon: Herbert Pun, MD;  Location: [facility] ORS;  Service: General;  Laterality: Right;   LEFT ATRIAL APPENDAGE OCCLUSION N/A 01/11/2021   Procedure: LEFT ATRIAL APPENDAGE OCCLUSION ([place]); ABORTED PROCEDURE WITHOUT DEVICE BEING IMPLANTED; Location: [facility]; Surgeon: Mylinda Latina, MD   LUNG BIOPSY     MASTECTOMY Left    2000, [place] Right    [place] TAKEDOWN N/A 10/23/2021   Procedure: XI ROBOTIC ASSISTED ILEOSTOMY TAKEDOWN;  Surgeon: Herbert Pun, MD;  Location: [facility] ORS;  Service: General;  Laterality: N/A;  180 minutes for the surgery part please    Medications Prior to Admission  Medication Sig Dispense Refill Last Dose   acetaminophen (TYLENOL) 325 MG tablet Take 2 tablets (650 mg total) by mouth every 6 (six) hours as needed for mild pain (or Fever >/= 101). (Patient taking differently: Take 650 mg by mouth every 4 (four) hours as needed for moderate pain.)   prn at prn   albuterol (PROVENTIL HFA;VENTOLIN HFA) 108 (90 Base)  MCG/ACT inhaler Inhale 2 puffs into the lungs every 6 (six) hours as needed for wheezing or shortness of breath. 1 Inhaler 2 prn at prn   amiodarone (PACERONE) 200 MG tablet Take 1 tablet (200 mg total) by mouth 2 (two) times daily. 60 tablet 0 05/21/2022 at 1900   atorvastatin (LIPITOR) 40 MG tablet Take 1 tablet (40 mg total) by mouth daily. 30 tablet 2 05/21/2022 at 1000   calcium  carbonate (TUMS - DOSED IN MG ELEMENTAL CALCIUM) 500 MG chewable tablet Chew 0.5 tablets by mouth daily as needed.   prn at prn   calcium-vitamin D (OSCAL WITH D) 500-5 MG-MCG tablet Take 1 tablet by mouth daily.   05/21/2022 at 1000   citalopram (CELEXA) 20 MG tablet Take 1 tablet (20 mg total) by mouth daily. (Patient taking differently: Take 40 mg by mouth daily.) 30 tablet 2 05/21/2022 at 1000   ferrous sulfate 325 (65 FE) MG tablet Take 325 mg by mouth 2 (two) times daily with a meal.   05/21/2022 at 1900   Fluticasone-Umeclidin-Vilant (TRELEGY ELLIPTA) 100-62.5-25 MCG/ACT AEPB Inhale 1 puff into the lungs daily.   05/21/2022 at 1000   loperamide (IMODIUM) 2 MG capsule Take 2 mg by mouth daily as needed for diarrhea or loose stools.   prn at prn   metoprolol tartrate (LOPRESSOR) 25 MG tablet Take 1 tablet (25 mg total) by mouth 2 (two) times daily. 60 tablet 2 05/21/2022 at 1900   Multiple Vitamin (MULTIVITAMIN WITH MINERALS) TABS tablet Take 1 tablet by mouth daily.   05/21/2022 at 1000   OXYGEN Inhale 4 L into the lungs daily.      pantoprazole (PROTONIX) 40 MG tablet Take 1 tablet (40 mg total) by mouth 2 (two) times daily before a meal. 60 tablet 2 05/21/2022 at 1000   sucralfate (CARAFATE) 1 GM/10ML suspension Take 0.5 g by mouth 2 (two) times daily.   05/21/2022 at 1900   torsemide 40 MG TABS Take 40 mg by mouth daily. (Patient taking differently: Take 20 mg by mouth daily.) 30 tablet 2 05/21/2022 at 1000   zolpidem (AMBIEN) 5 MG tablet Take 5 mg by mouth at bedtime as needed for sleep.   prn at prn   mupirocin ointment (BACTROBAN) 2 % Place into the nose 2 (two) times daily. (Patient not taking: Reported on 05/22/2022) 22 g 0 Not Taking   oxymetazoline (AFRIN) 0.05 % nasal spray Place 1 spray into both nostrils 2 (two) times daily. (Patient not taking: Reported on 05/22/2022)   Not Taking   sodium bicarbonate 650 MG tablet Take 1,300 mg by mouth daily. (Patient not taking: Reported on 05/22/2022)   Not Taking     Social History   Socioeconomic History   Marital status: Divorced    Spouse name: Not on file   Number of children: 3   Years of education: Not on file   Highest education level: Not on file  Occupational History   Occupation: Retired   Tobacco Use   Smoking status: Former    Packs/day: 0.50    Years: 20.00    Total pack years: 10.00    Types: Cigarettes    Quit date: 07/02/2012    Years since quitting: 9.8   Smokeless tobacco: Current    Types: Snuff  Vaping Use   Vaping Use: Never used  Substance and Sexual Activity   Alcohol use: Yes    Alcohol/week: 3.0 standard drinks of alcohol    Types: 3 Cans  of beer per week    Comment: Occasionally beer   Drug use: No   Sexual activity: Not Currently  Other Topics Concern   Not on file  Social History Narrative   Not on file   Social Determinants of Health   Financial Resource Strain: High Risk (07/05/2018)   Overall Financial Resource Strain (CARDIA)    Difficulty of Paying Living Expenses: Hard  Food Insecurity: Food Insecurity Present (07/05/2018)   Hunger Vital Sign    Worried About Running Out of Food in the Last Year: Often true    Ran Out of Food in the Last Year: Often true  Transportation Needs: No Transportation Needs (02/28/2022)   PRAPARE - Hydrologist (Medical): No    Lack of Transportation (Non-Medical): No  Physical Activity: Insufficiently Active (07/05/2018)   Exercise Vital Sign    Days of Exercise per Week: 2 days    Minutes of Exercise per Session: 30 min  Stress: No Stress Concern Present (07/05/2018)   [place]    Feeling of Stress : Not at all  Social Connections: Somewhat Isolated (07/05/2018)   Social Connection and Isolation Panel [NHANES]    Frequency of Communication with Friends and Family: More than three times a week    Frequency of Social Gatherings with Friends and Family: More than three  times a week    Attends Religious Services: 1 to 4 times per year    Active Member of [employer] or Organizations: No    Attends Archivist Meetings: Never    Marital Status: Divorced  Human resources officer Violence: Not At Risk (07/05/2018)   Humiliation, Afraid, Rape, and Kick questionnaire    Fear of Current or Ex-Partner: No    Emotionally Abused: No    Physically Abused: No    Sexually Abused: No    Family History  Problem Relation Age of Onset   Breast cancer Mother 73   Cancer Mother        Breast    Cirrhosis Father    Breast cancer Paternal Aunt 58   Cancer Maternal Aunt        Breast       PHYSICAL EXAM General: elderly and chronically ill appearing black female, in no acute distress. Sitting upright in PCU bed HEENT:  Normocephalic and atraumatic. Neck:  No JVD.  Lungs: Normal respiratory effort on 3L by [place]. Bibasilar crackles L worse than R  Heart: HRRR . Normal S1 and S2. 3/6 systolic murmur best heard at RUSB.   Abdomen: Non-distended appearing.  Msk: Normal strength and tone for age. Extremities: Warm and well perfused. No clubbing, cyanosis. No peripheral edema.  Neuro: Alert and oriented X 3. Psych:  Answers questions appropriately.   Labs:   Lab Results  Component Value Date   WBC 5.5 05/24/2022   HGB 8.5 (L) 05/24/2022   HCT 25.4 (L) 05/24/2022   MCV 96.2 05/24/2022   PLT 251 05/24/2022    Recent Labs  Lab 05/23/22 0554 05/24/22 0643  NA 137 136  K 3.8 3.3*  CL 104 101  CO2 26 25  BUN 38* 36*  CREATININE 2.44* 2.54*  CALCIUM 8.9 8.7*  PROT 6.4*  --   BILITOT 1.0  --   ALKPHOS 56  --   ALT 16  --   AST 29  --   GLUCOSE 81 88    Lab Results  Component Value Date  CKTOTAL 207 06/26/2013   CKMB 1.9 06/27/2013   TROPONINI <0.03 12/24/2018    Lab Results  Component Value Date   CHOL 152 01/21/2020   Lab Results  Component Value Date   HDL 87 01/21/2020   Lab Results  Component Value Date   LDLCALC 53 01/21/2020   Lab  Results  Component Value Date   TRIG 95 11/06/2021   TRIG 57 11/02/2021   TRIG 60 01/21/2020   Lab Results  Component Value Date   CHOLHDL 1.7 01/21/2020   No results found for: "LDLDIRECT"    Radiology: US THORACENTESIS ASP PLEURAL SPACE W/IMG GUIDE  Result Date: 05/23/2022 INDICATION: Patient with history of known atrial fibrillation, CKD, CHF and history of adenocarcinoma of right lung status post radiation. Patient has recurrent right pleural effusion. Request for therapeutic right thoracentesis. EXAM: ULTRASOUND GUIDED THERAPEUTIC RIGHT THORACENTESIS MEDICATIONS: 10 mL 1 % lidocaine COMPLICATIONS: None immediate. PROCEDURE: An ultrasound guided thoracentesis was thoroughly discussed with the patient and questions answered. The benefits, risks, alternatives and complications were also discussed. The patient understands and wishes to proceed with the procedure. Written consent was obtained. Ultrasound was performed to localize and mark an adequate pocket of fluid in the right chest. The area was then prepped and draped in the normal sterile fashion. 1% Lidocaine was used for local anesthesia. Under ultrasound guidance a 6 Fr Safe-T-Centesis catheter was introduced. Thoracentesis was performed. The catheter was removed and a dressing applied. FINDINGS: A total of approximately 900 cc of clear, amber fluid was removed. IMPRESSION: Successful ultrasound guided right thoracentesis yielding 900 cc of pleural fluid. Read by: Narda Rutherford, AGNP-BC Electronically Signed   By: Albin Felling M.D.   On: 05/23/2022 08:49   DG Chest Port 1 View  Result Date: 05/22/2022 CLINICAL DATA:  Right-sided thoracentesis. EXAM: PORTABLE CHEST 1 VIEW COMPARISON:  Earlier same day. FINDINGS: Significant interval improvement patient's right-sided effusion with only small amount of residual right pleural fluid noted. Patient is post right-sided thoracentesis. No evidence of pneumothorax. Stable density over the right mid to  upper lung as well as stable prominence of the right hilum. Left lung is clear. Cardiomediastinal silhouette and remainder of the exam is unchanged. IMPRESSION: 1. Significant interval improvement in patient's right-sided pleural effusion post thoracentesis. No pneumothorax. Minimal residual right pleural fluid. 2. Stable changes over the right mid to upper lung and stable prominence of the right hilum. Electronically Signed   By: Marin Olp M.D.   On: 05/22/2022 17:58   DG Chest Portable 1 View  Result Date: 05/22/2022 CLINICAL DATA:  SOB EXAM: PORTABLE CHEST 1 VIEW COMPARISON:  Chest x-ray 05/08/2022, CT chest 02/21/2022 FINDINGS: The heart and mediastinal contours are unchanged. Atherosclerotic plaque. No focal consolidation. Chronic coarsened markings with no overt pulmonary edema. Interval increase in a small to moderate volume right pleural effusion. No pneumothorax. No acute osseous abnormality. IMPRESSION: 1. Interval increase in a small to moderate right pleural effusion. 2. Aortic Atherosclerosis (ICD10-I70.0) and Emphysema (ICD10-J43.9). Electronically Signed   By: Iven Finn M.D.   On: 05/22/2022 16:25   DG Chest Port 1 View  Result Date: 05/08/2022 CLINICAL DATA:  Status post thoracentesis. EXAM: PORTABLE CHEST 1 VIEW COMPARISON:  Chest x-ray, same date. FINDINGS: Interval evacuation of the right pleural fluid collection. No postprocedural pneumothorax. IMPRESSION: Status post right thoracentesis. No residual pleural fluid and no pneumothorax. Electronically Signed   By: Marijo Sanes M.D.   On: 05/08/2022 12:18   US THORACENTESIS ASP PLEURAL  SPACE W/IMG GUIDE  Result Date: 05/08/2022 INDICATION: Patient with recurrent right pleural effusion, request received for diagnostic and therapeutic thoracentesis. Patient with known atrial fibrillation, chronic kidney disease, history of adenocarcinoma of the right lung status post radiation and congestive heart failure. EXAM: ULTRASOUND GUIDED  RIGHT THORACENTESIS MEDICATIONS: Local 1% lidocaine only. COMPLICATIONS: None immediate. PROCEDURE: An ultrasound guided thoracentesis was thoroughly discussed with the patient and questions answered. The benefits, risks, alternatives and complications were also discussed. The patient understands and wishes to proceed with the procedure. Written consent was obtained. Ultrasound was performed to localize and mark an adequate pocket of fluid in the right chest. The area was then prepped and draped in the normal sterile fashion. 1% Lidocaine was used for local anesthesia. Under ultrasound guidance a 19 gauge, 7-cm, Yueh catheter was introduced. Thoracentesis was performed. The catheter was removed and a dressing applied. FINDINGS: A total of approximately 1.2 L of clear yellow fluid was removed. Samples were sent to the laboratory as requested by the clinical team. IMPRESSION: Successful ultrasound guided right thoracentesis yielding 1.2 L of pleural fluid. This exam was performed by Tsosie Billing PA-C, and was supervised and interpreted by Dr. Kathlene Cote. Electronically Signed   By: Aletta Edouard M.D.   On: 05/08/2022 12:10   CT ABDOMEN PELVIS WO CONTRAST  Result Date: 05/08/2022 CLINICAL DATA:  Acute non localized abdominal pain for several days. Anorexia. EXAM: CT ABDOMEN AND PELVIS WITHOUT CONTRAST TECHNIQUE: Multidetector CT imaging of the abdomen and pelvis was performed following the standard protocol without IV contrast. RADIATION DOSE REDUCTION: This exam was performed according to the departmental dose-optimization program which includes automated exposure control, adjustment of the mA and/or kV according to patient size and/or use of iterative reconstruction technique. COMPARISON:  04/13/2022 FINDINGS: Lower chest: Moderate right pleural effusion, without significant change. Hepatobiliary: Small hepatic cysts remains stable. No new or enlarging liver lesions seen on this unenhanced exam. High attenuation  gallbladder sludge again noted, however there is no evidence of cholecystitis or biliary ductal dilatation. Pancreas: No mass or inflammatory process visualized on this unenhanced exam. Spleen:  Within normal limits in size. Adrenals/Urinary tract: No evidence of urolithiasis or hydronephrosis. Unremarkable unopacified urinary bladder. Stomach/Bowel: No evidence of obstruction, inflammatory process, or abnormal fluid collections. Diverticulosis is seen mainly involving the sigmoid colon, however there is no evidence of diverticulitis. Vascular/Lymphatic: No pathologically enlarged lymph nodes identified. No evidence of abdominal aortic aneurysm. Aortic atherosclerotic calcification incidentally noted. Reproductive: No mass or other significant abnormality. Bilateral tubal ligation clips are noted. Other:  None. Musculoskeletal:  No suspicious bone lesions identified. IMPRESSION: No evidence of urolithiasis, hydronephrosis, or other acute findings. Colonic diverticulosis, without radiographic evidence of diverticulitis. Stable moderate right pleural effusion. Electronically Signed   By: Marlaine Hind M.D.   On: 05/08/2022 09:12   CT HEAD WO CONTRAST (5MM)  Result Date: 05/08/2022 CLINICAL DATA:  Dizziness, persistent/recurrent, cardiac or vascular cause suspected EXAM: CT HEAD WITHOUT CONTRAST TECHNIQUE: Contiguous axial images were obtained from the base of the skull through the vertex without intravenous contrast. RADIATION DOSE REDUCTION: This exam was performed according to the departmental dose-optimization program which includes automated exposure control, adjustment of the mA and/or kV according to patient size and/or use of iterative reconstruction technique. COMPARISON:  01/29/2022 FINDINGS: Brain: No evidence of acute infarction, hemorrhage, hydrocephalus, extra-axial collection or mass lesion/mass effect. Scattered low-density changes within the periventricular and subcortical white matter compatible  with chronic microvascular ischemic change. Mild diffuse cerebral volume loss. Vascular:  Atherosclerotic calcifications involving the large vessels of the skull base. No unexpected hyperdense vessel. Skull: Normal. Negative for fracture or focal lesion. Sinuses/Orbits: No acute finding. Other: None. IMPRESSION: 1. No acute intracranial abnormality. 2. Mild chronic microvascular ischemic change and cerebral volume loss. Electronically Signed   By: Davina Poke D.O.   On: 05/08/2022 09:10   DG Chest Portable 1 View  Result Date: 05/08/2022 CLINICAL DATA:  Shortness of breath EXAM: PORTABLE CHEST 1 VIEW COMPARISON:  Chest x-ray dated April 30, 2021 FINDINGS: Cardiac and mediastinal contours are unchanged. Background emphysema. Stable right perihilar and right upper lobe nodular opacities. No new parenchymal opacity. Stable small right pleural effusion. No evidence of pneumothorax. IMPRESSION: Stable small right pleural effusion and right lung nodular opacities. Electronically Signed   By: Yetta Glassman M.D.   On: 05/08/2022 08:34   DG Chest 2 View  Result Date: 04/30/2022 CLINICAL DATA:  Chest pressure. EXAM: CHEST - 2 VIEW COMPARISON:  Chest radiograph dated 04/13/2022. FINDINGS: Slight improvement of vascular congestion since the prior radiograph. Similar appearance of right pleural effusion and right lung base atelectasis. Pneumonia is not excluded. The left lung is clear. No pneumothorax. Mild cardiomegaly. Atherosclerotic calcification of the aorta. No acute osseous pathology. IMPRESSION: 1. Slight improvement of vascular congestion. 2. Similar appearance of right pleural effusion and right lung base atelectasis. Electronically Signed   By: Anner Crete M.D.   On: 04/30/2022 01:20    ECHO 01/25/2022 IMPRESSIONS     1. Ant/apical/setal hpo.   2. Left ventricular ejection fraction, by estimation, is 35 to 40%. The  left ventricle has moderately decreased function. The left ventricle   demonstrates regional wall motion abnormalities (see scoring  diagram/findings for description). The left  ventricular internal cavity size was mildly dilated. Left ventricular  diastolic parameters are consistent with Grade II diastolic dysfunction  (pseudonormalization).   3. Right ventricular systolic function is low normal. The right  ventricular size is normal.   4. The mitral valve is normal in structure. Moderate mitral valve  regurgitation.   5. Tricuspid valve regurgitation is moderate to severe.   6. The aortic valve is normal in structure. Aortic valve regurgitation is  not visualized. Moderate aortic valve stenosis.   FINDINGS   Left Ventricle: Left ventricular ejection fraction, by estimation, is 35  to 40%. The left ventricle has moderately decreased function. The left  ventricle demonstrates regional wall motion abnormalities. The left  ventricular internal cavity size was  mildly dilated. There is borderline concentric left ventricular  hypertrophy. Left ventricular diastolic parameters are consistent with  Grade II diastolic dysfunction (pseudonormalization).   Right Ventricle: The right ventricular size is normal. No increase in  right ventricular wall thickness. Right ventricular systolic function is  low normal.   Left Atrium: Left atrial size was normal in size.   Right Atrium: Right atrial size was not assessed.   Pericardium: There is no evidence of pericardial effusion.   Mitral Valve: The mitral valve is normal in structure. Moderate mitral  valve regurgitation.   Tricuspid Valve: The tricuspid valve is grossly normal. Tricuspid valve  regurgitation is moderate to severe.   Aortic Valve: The aortic valve is normal in structure. Aortic valve  regurgitation is not visualized. Moderate aortic stenosis is present.  Aortic valve mean gradient measures 6.3 mmHg. Aortic valve peak gradient  measures 11.6 mmHg. Aortic valve area, by  VTI measures 0.80 cm.    Pulmonic Valve: The pulmonic valve was normal in structure.  Pulmonic valve  regurgitation is trivial.   Aorta: The ascending aorta was not well visualized.   IAS/Shunts: No atrial level shunt detected by color flow Doppler.   Additional Comments: Ant/apical/setal hpo.      LEFT VENTRICLE  PLAX 2D  LVIDd:         3.90 cm  LVIDs:         3.00 cm  LV PW:         1.30 cm  LV IVS:        0.85 cm  LVOT diam:     2.00 cm  LV SV:         20  LV SV Index:   12  LVOT Area:     3.14 cm     LV Volumes (MOD)  LV vol d, MOD A2C: 35.6 ml  LV vol d, MOD A4C: 62.7 ml  LV vol s, MOD A2C: 23.2 ml  LV vol s, MOD A4C: 42.8 ml  LV SV MOD A2C:     12.4 ml  LV SV MOD A4C:     62.7 ml  LV SV MOD BP:      18.0 ml   RIGHT VENTRICLE  RV Basal diam:  3.70 cm  RV S prime:     10.20 cm/s  TAPSE (M-mode): 1.1 cm   LEFT ATRIUM           Index        RIGHT ATRIUM           Index  LA diam:      3.20 cm 2.00 cm/m   RA Area:     21.70 cm  LA Vol (A2C): 48.6 ml 30.37 ml/m  RA Volume:   71.60 ml  44.74 ml/m  LA Vol (A4C): 15.7 ml 9.81 ml/m   AORTIC VALVE                     PULMONIC VALVE  AV Area (Vmax):    0.81 cm      PV Vmax:        0.39 m/s  AV Area (Vmean):   0.84 cm      PV Vmean:       27.500 cm/s  AV Area (VTI):     0.80 cm      PV VTI:         0.051 m  AV Vmax:           170.00 cm/s   PV Peak grad:   0.6 mmHg  AV Vmean:          114.667 cm/s  PV Mean grad:   0.0 mmHg  AV VTI:            0.247 m       RVOT Peak grad: 2 mmHg  AV Peak Grad:      11.6 mmHg  AV Mean Grad:      6.3 mmHg  LVOT Vmax:         43.70 cm/s  LVOT Vmean:        30.800 cm/s  LVOT VTI:          0.063 m  LVOT/AV VTI ratio: 0.25     AORTA  Ao Root diam: 3.00 cm   MITRAL VALVE               TRICUSPID VALVE  MV Area (PHT): 8.34 cm    TR Peak grad:   42.5 mmHg  MV Decel Time: 91 msec     TR Vmax:        326.00 cm/s  MV E velocity: 70.30 cm/s  MV A velocity: 57.00 cm/s  SHUNTS  MV E/A ratio:  1.23         Systemic VTI:  0.06 m                             Systemic Diam: 2.00 cm                             Pulmonic VTI:  0.086 m   Yolonda Kida MD  Electronically signed by Yolonda Kida MD  Signature Date/Time: 01/30/2022/1:54:44 PM   TELEMETRY reviewed by me: Periods of tachycardia and what looked like atrial flutter into the 120s, converted to sinus rhythm this morning with rate in the 70s.  EKG reviewed by me: Initially atrial fibrillation rate 104 nonspecific T wave abnormality, repeat 8/9 sinus rhythm rate 74.  ASSESSMENT AND PLAN:  Adonia Porada is a 66 year old female with a past medical history of advanced COPD on 3L O2, paroxysmal atrial fibrillation/flutter (dose reduced eliquis held due to recent epistaxis), HFrEF (LVEF 35-40%, g2dd, mod-sev TR, mod AS, mod MS 01/2022), recurrent Right pleural effusion, CKD (baseline Cr. 2-2.5), remote history of lung cancer s/p radiation, esophageal cancer s/p radiation, MGUS, who presented to [facility] ED 05/22/22 from Dr. Alveria Apley office with worsening shortness of breath. Cardiology is consulted for further assistance.   # Acute on chronic HFrEF (LVEF 35-40%) # Moderate aortic stenosis # Acute on chronic respiratory failure # Recurrent right pleural effusion The patient has had multiple admissions over the past year with acute on chronic HFrEF/respiratory failure complicated by advanced COPD, moderate aortic stenosis, and CKD 4.  She says she has not been making as much urine with her home torsemide 40 mg daily but fortunately she feels better and is back to her recent baseline oxygen requirement of 3 L after right thoracentesis yesterday.  Her BNP is markedly elevated at 1900 and she remains with crackles in her lungs bilaterally, worse on the left. -S/p right thoracentesis with 921m of fluid removed on 8/8 -S/p 40 mg IV Lasix x 2, s/p metolazone 2.5 mg x 1 with minimal urine output per patient.    -Renal function worsened with diuresis, hold for  today. -Continue GDMT with metoprolol tartrate 25 mg twice daily as her blood pressure allows -Continue Isordil 5 mg 3 times daily and hydralazine 10 mg 3 times daily for further afterload reduction -Defer ACE inhibitor/ARB, spironolactone, Jardiance due to CKD -Strict I's/O, daily weights  # Paroxysmal atrial fibrillation/flutter Was in atrial fibrillation on presentation with heart rate in the 120s, converted to sinus rhythm with rate in the 70s to 80s the morning of 8/9.  Notably, she has been off of dose reduced Eliquis since discharge on 7/29 due to significant epistaxis, cauterized by ENT and was told to follow-up with her regular cardiologist to determine when to restart. -Hemoglobin trending 9.7-8.0-8.5 without obvious signs of bleeding -Favor restarting Eliquis at 2.5 mg twice daily today -Recommend humidified oxygen if possible while inpatient, and as needed Afrin for maximum of 3 days -Continue amiodarone 2049monce daily   # CKD4 Renal function worsening from her baseline on admission, continue to monitor daily with diuresis.  This patient's plan of care was discussed and created with  Dr. [name] and he is in agreement.  Signed: Tristan Schroeder , PA-C 05/24/2022, 10:09 AM [facility]  The patient has remained in normal sinus rhythm her entire hospitalization with no evidence of anginal symptoms or worsening congestive heart failure.  The patient has had relatively clear lungs with only a few crackles in the base and no evidence of lower extremity edema.  The patient has had continued dizziness for which the patient has been concerned although there is no primary cause of dizziness requiring further intervention at this time.  We have discussed at length the risk and benefits of additional Eliquis back to her regimen with her anemia as well.  I have personally reviewed her telemetry, previous chest x-ray, EKG  The patient has been interviewed and examined. I  agree with assessment and plan above. Serafina Royals MD [facility]

## 2022-05-24 NOTE — Plan of Care (Signed)
  Problem: Education: Goal: Ability to demonstrate management of disease process will improve Outcome: Progressing Goal: Ability to verbalize understanding of medication therapies will improve Outcome: Progressing   

## 2022-05-24 NOTE — Progress Notes (Signed)
Initial Nutrition Assessment  DOCUMENTATION CODES:   Severe malnutrition in context of chronic illness  INTERVENTION:   -Nepro Shake po BID, each supplement provides 425 kcal and 19 grams protein  -MVI with minerals daily -Liberalize diet to 2 gram sodium for wider variety of meal selections  NUTRITION DIAGNOSIS:   Severe Malnutrition related to chronic illness (CHF) as evidenced by moderate fat depletion, severe fat depletion, moderate muscle depletion, severe muscle depletion.  GOAL:   Patient will meet greater than or equal to 90% of their needs  MONITOR:   PO intake, Supplement acceptance  REASON FOR ASSESSMENT:   Consult Assessment of nutrition requirement/status  ASSESSMENT:   Pt admitted with complaints of SOB on her baseline 4L .  Pt  has h/o lung cancer and esophageal cancer and chf.  Pt admitted with recurrent rt pleural effusion.   8/8- s/p rt thoracentesis (900 ml fluid removed)   Reviewed I/O's: +243 ml x 24 hours  Spoke with pt at bedside, who reports feeling very weak over the past week. Per pt, she had to use her bedside commode and have her daughter prepare meals for her for the past week (things she is normally able to do). Per reports ongoing poor oral intake, only consume 1 meal per day (meat and vegetable). Pt drinks mainly water. She was taking Nepro, but reports she stopped using it because her doctor told her it was affecting her kidneys. Noted meal completions 50%. Noted pt consumed about 75% of breakfast tray.   Pt reports her UBW is around 125# and estimates she has lost about 15 pounds over the past months. Reviewed wt hx; pt wt has been stable over the past 4 months.   Discussed importance of good meal and supplement intake to promote healing.   Medications reviewed and include potassium chloride.   Labs reviewed: K: 3.3, CBGS: 82-115 (inpatient orders for glycemic control are none).    NUTRITION - FOCUSED PHYSICAL EXAM:  Flowsheet Row  Most Recent Value  Orbital Region Mild depletion  Upper Arm Region Severe depletion  Thoracic and Lumbar Region Severe depletion  Buccal Region Moderate depletion  Temple Region Severe depletion  Clavicle Bone Region Severe depletion  Clavicle and Acromion Bone Region Severe depletion  Scapular Bone Region Severe depletion  Dorsal Hand Severe depletion  Patellar Region Severe depletion  Anterior Thigh Region Severe depletion  Posterior Calf Region Severe depletion  Edema (RD Assessment) None  Hair Reviewed  Eyes Reviewed  Mouth Reviewed  Skin Reviewed  Nails Reviewed       Diet Order:   Diet Order             Diet 2 gram sodium Room service appropriate? Yes; Fluid consistency: Thin; Fluid restriction: 1500 mL Fluid  Diet effective now                   EDUCATION NEEDS:   Education needs have been addressed  Skin:  Skin Assessment: Reviewed RN Assessment  Last BM:  05/23/22  Height:   Ht Readings from Last 1 Encounters:  05/22/22 5\' 5"  (1.651 m)    Weight:   Wt Readings from Last 1 Encounters:  05/24/22 53.1 kg    Ideal Body Weight:  56.8 kg  BMI:  Body mass index is 19.49 kg/m.  Estimated Nutritional Needs:   Kcal:  1650-1850  Protein:  80-95 grams  Fluid:  1.5 L    Loistine Chance, RD, LDN, [place] Registered Dietitian II Certified Diabetes  Care and Education Specialist Please refer to [facility] for RD and/or RD on-call/weekend/after hours pager

## 2022-05-25 DIAGNOSIS — N179 Acute kidney failure, unspecified: Secondary | ICD-10-CM | POA: Diagnosis not present

## 2022-05-25 DIAGNOSIS — I480 Paroxysmal atrial fibrillation: Secondary | ICD-10-CM | POA: Diagnosis not present

## 2022-05-25 DIAGNOSIS — N189 Chronic kidney disease, unspecified: Secondary | ICD-10-CM | POA: Diagnosis not present

## 2022-05-25 DIAGNOSIS — I5043 Acute on chronic combined systolic (congestive) and diastolic (congestive) heart failure: Secondary | ICD-10-CM | POA: Diagnosis not present

## 2022-05-25 LAB — BASIC METABOLIC PANEL
Anion gap: 5 (ref 5–15)
BUN: 37 mg/dL — ABNORMAL HIGH (ref 8–23)
CO2: 23 mmol/L (ref 22–32)
Calcium: 8.9 mg/dL (ref 8.9–10.3)
Chloride: 106 mmol/L (ref 98–111)
Creatinine, Ser: 2.52 mg/dL — ABNORMAL HIGH (ref 0.44–1.00)
GFR, Estimated: 20 mL/min — ABNORMAL LOW (ref >60)
Glucose, Bld: 86 mg/dL (ref 70–99)
Potassium: 4.3 mmol/L (ref 3.5–5.1)
Sodium: 134 mmol/L — ABNORMAL LOW (ref 135–145)

## 2022-05-25 LAB — CBC
HCT: 23.9 % — ABNORMAL LOW (ref 36.0–46.0)
Hemoglobin: 7.9 g/dL — ABNORMAL LOW (ref 12.0–15.0)
MCH: 32.1 pg (ref 26.0–34.0)
MCHC: 33.1 g/dL (ref 30.0–36.0)
MCV: 97.2 fL (ref 80.0–100.0)
Platelets: 214 10*3/uL (ref 150–400)
RBC: 2.46 MIL/uL — ABNORMAL LOW (ref 3.87–5.11)
RDW: 15.2 % (ref 11.5–15.5)
WBC: 4.9 10*3/uL (ref 4.0–10.5)
nRBC: 0.6 % — ABNORMAL HIGH (ref 0.0–0.2)

## 2022-05-25 LAB — MAGNESIUM: Magnesium: 1.6 mg/dL — ABNORMAL LOW (ref 1.7–2.4)

## 2022-05-25 MED ORDER — MAGNESIUM SULFATE 2 GM/50ML IV SOLN
2.0000 g | Freq: Once | INTRAVENOUS | Status: AC
  Administered 2022-05-25: 2 g via INTRAVENOUS
  Filled 2022-05-25: qty 50

## 2022-05-25 NOTE — Plan of Care (Signed)
?  Problem: Education: ?Goal: Ability to demonstrate management of disease process will improve ?Outcome: Progressing ?  ?

## 2022-05-25 NOTE — Progress Notes (Signed)
PROGRESS NOTE    Danielle [name]  GNO:[id number] DOB: 1956/09/16 DOA: 05/22/2022 PCP: [name], PA    Assessment & Plan:   Principal Problem:   SOB (shortness of breath) Active Problems:   Acute on chronic respiratory failure with hypoxia (HCC)   Recurrent right pleural effusion   Acute on chronic combined systolic and diastolic CHF (congestive heart failure) (HCC)   Paroxysmal atrial fibrillation (HCC)   Acute kidney injury superimposed on CKD ([place])   Essential hypertension   GERD (gastroesophageal reflux disease)   Dyspnea  Assessment and Plan: Dyspnea: likely secondary to recurrent right pleural effusion & CHF. S/p right thoracentesis. Shortness of breath improved today    Recurrent right pleural effusion: improved after right thoracentesis    Acute on chronic hypoxic respiratory failure: continue on supplemental oxygen. Initially on BiPAP but has since been weaned off. Continue on 2L [place] and usually uses 3-4L  at home    PAF: continue on eliquis, metoprolol & amiodarone     Acute on chronic combined CHF: continue on BB, statin. Continue to hold all diuretics. Monitor I/Os. Cardio following and recs apprec   AKI on CKDIV:  Cr is trending down slightly today. Avoid nephrotoxic meds   ACD: likely secondary to CKD. H&H are labile    HTN: continue on metoprolol. Holding torsemide    GERD: continue on PPI   Hx of lung & esophageal cancer: management per onco outpatient   Depression: severity unknown. Continue on home dose of citalopram   DVT prophylaxis: heparin  Code Status: full  Family Communication:  Disposition Plan: likely d/c back home   Level of care: Telemetry Cardiac  Status is: Inpatient Remains inpatient appropriate because: severity of illness    Consultants:    Procedures:   Antimicrobials:   Subjective: Pt c/o fatigue   Objective: Vitals:   05/24/22 2006 05/24/22 2350 05/25/22 0334 05/25/22 0844  BP: 105/74 102/70 102/75 99/70   Pulse: 68 69 64 64  Resp: 18 16 16 18   Temp: (!) 97.5 F (36.4 C) 97.6 F (36.4 C) 97.8 F (36.6 C) 97.7 F (36.5 C)  TempSrc: Oral Oral Oral Oral  SpO2: 97% 94% 97% 98%  Weight:   54.1 kg   Height:        Intake/Output Summary (Last 24 hours) at 05/25/2022 0910 Last data filed at 05/24/2022 1422 Gross per 24 hour  Intake 480 ml  Output 0 ml  Net 480 ml   Filed Weights   05/22/22 1526 05/24/22 0521 05/25/22 0334  Weight: 51 kg 53.1 kg 54.1 kg    Examination:  General exam: Appears calm & comfortable   Respiratory system: decreased breath sounds b/l  Cardiovascular system: S1 & S2+. No rubs or gallops  Gastrointestinal system: Abd is soft, NT, ND & normal bowel sounds   Central nervous system: Alert and oriented. Moves all extremities  Psychiatry: Judgement and insight are at baseline. Flat mood and affect     Data Reviewed: I have personally reviewed following labs and imaging studies  CBC: Recent Labs  Lab 05/22/22 1605 05/23/22 0554 05/24/22 0643 05/25/22 0624  WBC 7.7 4.6 5.5 4.9  NEUTROABS 6.2  --   --   --   HGB 9.7* 8.0* 8.5* 7.9*  HCT 30.1* 23.9* 25.4* 23.9*  MCV 99.0 96.4 96.2 97.2  PLT 321 249 251 169   Basic Metabolic Panel: Recent Labs  Lab 05/22/22 1605 05/23/22 0554 05/24/22 0643 05/25/22 0624  NA 138 137 136  134*  K 4.1 3.8 3.3* 4.3  CL 100 104 101 106  CO2 20* 26 25 23   GLUCOSE 62* 81 88 86  BUN 37* 38* 36* 37*  CREATININE 2.29* 2.44* 2.54* 2.52*  CALCIUM 9.8 8.9 8.7* 8.9  MG  --   --   --  1.6*   GFR: Estimated Creatinine Clearance: 18.2 mL/min (A) (by C-G formula based on SCr of 2.52 mg/dL (H)). Liver Function Tests: Recent Labs  Lab 05/22/22 1605 05/23/22 0554  AST 58* 29  ALT 29 16  ALKPHOS 73 56  BILITOT 1.4* 1.0  PROT 8.5* 6.4*  ALBUMIN 4.3 3.3*   No results for input(s): "LIPASE", "AMYLASE" in the last 168 hours. No results for input(s): "AMMONIA" in the last 168 hours. Coagulation Profile: No results for  input(s): "INR", "PROTIME" in the last 168 hours. Cardiac Enzymes: No results for input(s): "CKTOTAL", "CKMB", "CKMBINDEX", "TROPONINI" in the last 168 hours. BNP (last 3 results) No results for input(s): "PROBNP" in the last 8760 hours. HbA1C: No results for input(s): "HGBA1C" in the last 72 hours. CBG: Recent Labs  Lab 05/23/22 0559 05/23/22 1047  GLUCAP 82 116*   Lipid Profile: No results for input(s): "CHOL", "HDL", "LDLCALC", "TRIG", "CHOLHDL", "LDLDIRECT" in the last 72 hours. Thyroid Function Tests: No results for input(s): "TSH", "T4TOTAL", "FREET4", "T3FREE", "THYROIDAB" in the last 72 hours. Anemia Panel: No results for input(s): "VITAMINB12", "FOLATE", "FERRITIN", "TIBC", "IRON", "RETICCTPCT" in the last 72 hours. Sepsis Labs: Recent Labs  Lab 05/22/22 2207  PROCALCITON <0.10    Recent Results (from the past 240 hour(s))  SARS Coronavirus 2 by RT PCR (hospital order, performed in [facility] hospital lab) *cepheid single result test* Anterior Nasal Swab     Status: None   Collection Time: 05/22/22  9:55 PM   Specimen: Anterior Nasal Swab  Result Value Ref Range Status   SARS Coronavirus 2 by RT PCR NEGATIVE NEGATIVE Final    Comment: (NOTE) SARS-CoV-2 target nucleic acids are NOT DETECTED.  The SARS-CoV-2 RNA is generally detectable in upper and lower respiratory specimens during the acute phase of infection. The lowest concentration of SARS-CoV-2 viral copies this assay can detect is 250 copies / mL. A negative result does not preclude SARS-CoV-2 infection and should not be used as the sole basis for treatment or other patient management decisions.  A negative result may occur with improper specimen collection / handling, submission of specimen other than nasopharyngeal swab, presence of viral mutation(s) within the areas targeted by this assay, and inadequate number of viral copies (<250 copies / mL). A negative result must be combined with  clinical observations, patient history, and epidemiological information.  Fact Sheet for Patients:   https://www.patel.info/  Fact Sheet for Healthcare Providers: https://hall.com/  This test is not yet approved or  cleared by the [place] FDA and has been authorized for detection and/or diagnosis of SARS-CoV-2 by FDA under an Emergency Use Authorization (EUA).  This EUA will remain in effect (meaning this test can be used) for the duration of the COVID-19 declaration under Section 564(b)(1) of the Act, 21 U.S.C. section 360bbb-3(b)(1), unless the authorization is terminated or revoked sooner.  Performed at [facility], [address], [place], [place] [address]          Radiology Studies: No results found.      Scheduled Meds:  amiodarone  200 mg Oral Daily   apixaban  2.5 mg Oral BID   atorvastatin  40 mg Oral  Daily   citalopram  20 mg Oral Daily   dextrose  1 ampule Intravenous Once   feeding supplement (NEPRO CARB STEADY)  237 mL Oral BID BM   fluticasone furoate-vilanterol  1 puff Inhalation Daily   And   umeclidinium bromide  1 puff Inhalation Daily   hydrALAZINE  10 mg Oral TID   isosorbide dinitrate  5 mg Oral TID   lidocaine  1 patch Transdermal Q24H   melatonin  2.5 mg Oral QHS   metoprolol tartrate  25 mg Oral BID   multivitamin with minerals  1 tablet Oral Daily   sodium chloride flush  3 mL Intravenous Q12H   Continuous Infusions:   LOS: 3 days    Time spent: 25 mins    Wyvonnia Dusky, MD [facility] Pager [phone]  If 7PM-7AM, please contact night-coverage [email or web address] 05/25/2022, 9:10 AM

## 2022-05-25 NOTE — Care Management Important Message (Signed)
Important Message  Patient Details  Name: ADELINE PETITFRERE MRN: [id number] Date of Birth: 26-Jun-1956   Medicare Important Message Given:  Yes     Dannette Katrece 05/25/2022, 12:50 PM

## 2022-05-25 NOTE — Progress Notes (Addendum)
[place] NOTE       Patient ID: Danielle [name] MRN: [id number] DOB/AGE: 1956/08/17 66 y.o.  Admit date: 05/22/2022 Referring Physician Dr. Florina Ou Primary Physician [facility] Primary Cardiologist Dr. [name] Reason for Consultation acute on chronic HFrEF  HPI: Danielle [name] is a 66 year old female with a past medical history of advanced COPD on 3L O2, paroxysmal atrial fibrillation/flutter (dose reduced eliquis held due to recent epistaxis), HFrEF (LVEF 35-40%, g2dd, mod-sev TR, mod AS, mod MS 01/2022), recurrent Right pleural effusion, CKD (baseline Cr. 2-2.5), remote history of lung cancer s/p radiation, esophageal cancer s/p radiation, MGUS, who presented to [facility] ED 05/22/22 from Dr. Alveria Apley office with worsening shortness of breath. Cardiology is consulted for further assistance.   Interval History: - remains fatigued and with positional dizziness - Renal function stable with holding diuresis yesterday - No chest pain, palpitations, epistaxis  Review of systems complete and found to be negative unless listed above   Past Medical History:  Diagnosis Date   Acute respiratory failure due to COVID-19 ([place]) 09/27/2019   Anemia    Anxiety    Aortic atherosclerosis ([place])    Aortic valve stenosis 02/10/2018   a.) TTE 02/10/2018: EF 55-60%: mild AS with MPG of 12 mmHg. b.) TTE 04/21/2018: EF 35-40%; mild AS with MPG 8 mmHg. c.) TTE 11/07/2019: EF 50-55%; mild AS with MPG 10 mmHg.   Arthritis    Atrial fibrillation (HCC)    a.) CHA2DS2-VASc = 4 (age, sex, HTN, aortic plaque). b.) Rate/rhythm maintained on oral carvedilol; chronically anticoagulated on dose reduced apixaban. c.)  Attempted deployment of LAA occlusive device on 01/11/2021; parameters failed and procedure aborted.   Breast cancer, left ([place]) 2000   a.) T2N1M0; ER/PR (+) --> Tx'd with total mastectomy, LN resection, XRT, and chemotherapy   Cancer of right lung ([place])  07/30/2016   a.) adenocarcinoma; ALK, ROS1, PDL1, BRAF, EGFR all negative.   Chronic diastolic CHF (congestive heart failure) ([place]) 11/26/2021   CKD (chronic kidney disease), stage IV (HCC)    COPD (chronic obstructive pulmonary disease) (HCC)    Dependence on supplemental oxygen    Depression    Diastolic dysfunction 40/98/1191   a.) TTE 02/10/2017: EF 55-60%; G2DD. b.) TTE 04/21/2018: EF 35-40%; mild LA dilation; mod MV regurgitation. c.) TTE 11/07/2019: EF 50-55%; G1DD.   DOE (dyspnea on exertion)    GERD (gastroesophageal reflux disease)    Heart murmur    History of 2019 novel coronavirus disease (COVID-19) 10/14/2019   HLD (hyperlipidemia)    Hypertension    Long term current use of anticoagulant    a.) apixaban   Lymphedema    Personal history of chemotherapy    Personal history of radiation therapy    Respiratory tract infection due to COVID-19 virus 09/27/2019   SBO (small bowel obstruction) ([place]) 11/05/2020   Vitamin D deficiency     Past Surgical History:  Procedure Laterality Date   Breast Biospy Left    [facility]   BREAST SURGERY     CARDIOVERSION N/A 02/23/2022   Procedure: CARDIOVERSION;  Surgeon: Corey Skains, MD;  Location: [facility] ORS;  Service: Cardiovascular;  Laterality: N/A;   COLONOSCOPY N/A 04/30/2018   Procedure: COLONOSCOPY;  Surgeon: Virgel Manifold, MD;  Location: [facility] ENDOSCOPY;  Service: Endoscopy;  Laterality: N/A;   COLONOSCOPY N/A 07/22/2018   Procedure: COLONOSCOPY;  Surgeon: Virgel Manifold, MD;  Location: [facility] ENDOSCOPY;  Service: Endoscopy;  Laterality: N/A;   COLONOSCOPY  WITH PROPOFOL N/A 09/21/2021   Procedure: COLONOSCOPY WITH PROPOFOL;  Surgeon: Benjamine Sprague, DO;  Location: [facility] ENDOSCOPY;  Service: General;  Laterality: N/A;   COLONOSCOPY WITH PROPOFOL N/A 03/11/2022   Procedure: COLONOSCOPY WITH PROPOFOL;  Surgeon: Annamaria Helling, DO;  Location: [facility] ENDOSCOPY;  Service: Gastroenterology;  Laterality: N/A;   DILATION AND  CURETTAGE OF UTERUS     ELECTROMAGNETIC NAVIGATION BROCHOSCOPY Right 04/11/2016   Procedure: ELECTROMAGNETIC NAVIGATION BRONCHOSCOPY;  Surgeon: Vilinda Boehringer, MD;  Location: [facility] ORS;  Service: Cardiopulmonary;  Laterality: Right;   ESOPHAGOGASTRODUODENOSCOPY N/A 07/22/2018   Procedure: ESOPHAGOGASTRODUODENOSCOPY (EGD);  Surgeon: Virgel Manifold, MD;  Location: [facility] ENDOSCOPY;  Service: Endoscopy;  Laterality: N/A;   ESOPHAGOGASTRODUODENOSCOPY (EGD) WITH PROPOFOL N/A 05/07/2018   Procedure: ESOPHAGOGASTRODUODENOSCOPY (EGD) WITH PROPOFOL;  Surgeon: Lucilla Lame, MD;  Location: [facility] ENDOSCOPY;  Service: Endoscopy;  Laterality: N/A;   ESOPHAGOGASTRODUODENOSCOPY (EGD) WITH PROPOFOL N/A 04/24/2019   Procedure: ESOPHAGOGASTRODUODENOSCOPY (EGD) WITH PROPOFOL;  Surgeon: Jonathon Bellows, MD;  Location: [facility] ENDOSCOPY;  Service: Gastroenterology;  Laterality: N/A;   ESOPHAGOGASTRODUODENOSCOPY (EGD) WITH PROPOFOL N/A 01/12/2020   Procedure: ESOPHAGOGASTRODUODENOSCOPY (EGD) WITH PROPOFOL;  Surgeon: Jonathon Bellows, MD;  Location: [facility] ENDOSCOPY;  Service: Gastroenterology;  Laterality: N/A;   ESOPHAGOGASTRODUODENOSCOPY (EGD) WITH PROPOFOL N/A 04/28/2020   Procedure: ESOPHAGOGASTRODUODENOSCOPY (EGD) WITH PROPOFOL;  Surgeon: Jonathon Bellows, MD;  Location: [facility] ENDOSCOPY;  Service: Gastroenterology;  Laterality: N/A;   ESOPHAGOGASTRODUODENOSCOPY (EGD) WITH PROPOFOL N/A 03/11/2022   Procedure: ESOPHAGOGASTRODUODENOSCOPY (EGD) WITH PROPOFOL;  Surgeon: Annamaria Helling, DO;  Location: [facility] ENDOSCOPY;  Service: Gastroenterology;  Laterality: N/A;   EUS N/A 05/07/2019   Procedure: FULL UPPER ENDOSCOPIC ULTRASOUND (EUS) RADIAL;  Surgeon: Jola Schmidt, MD;  Location: [facility] ENDOSCOPY;  Service: Endoscopy;  Laterality: N/A;   ILEOSCOPY N/A 07/22/2018   Procedure: ILEOSCOPY THROUGH STOMA;  Surgeon: Virgel Manifold, MD;  Location: [facility] ENDOSCOPY;  Service: Endoscopy;  Laterality: N/A;   ILEOSTOMY     ILEOSTOMY N/A  09/08/2018   Procedure: ILEOSTOMY REVISION POSSIBLE CREATION;  Surgeon: Herbert Pun, MD;  Location: [facility] ORS;  Service: General;  Laterality: N/A;   ILEOSTOMY CLOSURE N/A 08/15/2018   Procedure: DILATION OF ILEOSTOMY STRICTURE;  Surgeon: Herbert Pun, MD;  Location: [facility] ORS;  Service: General;  Laterality: N/A;   LAPAROTOMY Right 05/04/2018   Procedure: EXPLORATORY LAPAROTOMY right colectomy right and left ostomy;  Surgeon: Herbert Pun, MD;  Location: [facility] ORS;  Service: General;  Laterality: Right;   LEFT ATRIAL APPENDAGE OCCLUSION N/A 01/11/2021   Procedure: LEFT ATRIAL APPENDAGE OCCLUSION ([place]); ABORTED PROCEDURE WITHOUT DEVICE BEING IMPLANTED; Location: [facility]; Surgeon: Mylinda Latina, MD   LUNG BIOPSY     MASTECTOMY Left    2000, [place] Right    [place] TAKEDOWN N/A 10/23/2021   Procedure: XI ROBOTIC ASSISTED ILEOSTOMY TAKEDOWN;  Surgeon: Herbert Pun, MD;  Location: [facility] ORS;  Service: General;  Laterality: N/A;  180 minutes for the surgery part please    Medications Prior to Admission  Medication Sig Dispense Refill Last Dose   acetaminophen (TYLENOL) 325 MG tablet Take 2 tablets (650 mg total) by mouth every 6 (six) hours as needed for mild pain (or Fever >/= 101). (Patient taking differently: Take 650 mg by mouth every 4 (four) hours as needed for moderate pain.)   prn at prn   albuterol (PROVENTIL HFA;VENTOLIN HFA) 108 (90 Base) MCG/ACT inhaler Inhale 2 puffs into the lungs every 6 (six) hours as needed for wheezing  or shortness of breath. 1 Inhaler 2 prn at prn   amiodarone (PACERONE) 200 MG tablet Take 1 tablet (200 mg total) by mouth 2 (two) times daily. 60 tablet 0 05/21/2022 at 1900   atorvastatin (LIPITOR) 40 MG tablet Take 1 tablet (40 mg total) by mouth daily. 30 tablet 2 05/21/2022 at 1000   calcium carbonate (TUMS - DOSED IN MG ELEMENTAL CALCIUM) 500 MG chewable tablet Chew 0.5  tablets by mouth daily as needed.   prn at prn   calcium-vitamin D (OSCAL WITH D) 500-5 MG-MCG tablet Take 1 tablet by mouth daily.   05/21/2022 at 1000   citalopram (CELEXA) 20 MG tablet Take 1 tablet (20 mg total) by mouth daily. (Patient taking differently: Take 40 mg by mouth daily.) 30 tablet 2 05/21/2022 at 1000   ferrous sulfate 325 (65 FE) MG tablet Take 325 mg by mouth 2 (two) times daily with a meal.   05/21/2022 at 1900   Fluticasone-Umeclidin-Vilant (TRELEGY ELLIPTA) 100-62.5-25 MCG/ACT AEPB Inhale 1 puff into the lungs daily.   05/21/2022 at 1000   loperamide (IMODIUM) 2 MG capsule Take 2 mg by mouth daily as needed for diarrhea or loose stools.   prn at prn   metoprolol tartrate (LOPRESSOR) 25 MG tablet Take 1 tablet (25 mg total) by mouth 2 (two) times daily. 60 tablet 2 05/21/2022 at 1900   Multiple Vitamin (MULTIVITAMIN WITH MINERALS) TABS tablet Take 1 tablet by mouth daily.   05/21/2022 at 1000   OXYGEN Inhale 4 L into the lungs daily.      pantoprazole (PROTONIX) 40 MG tablet Take 1 tablet (40 mg total) by mouth 2 (two) times daily before a meal. 60 tablet 2 05/21/2022 at 1000   sucralfate (CARAFATE) 1 GM/10ML suspension Take 0.5 g by mouth 2 (two) times daily.   05/21/2022 at 1900   torsemide 40 MG TABS Take 40 mg by mouth daily. (Patient taking differently: Take 20 mg by mouth daily.) 30 tablet 2 05/21/2022 at 1000   zolpidem (AMBIEN) 5 MG tablet Take 5 mg by mouth at bedtime as needed for sleep.   prn at prn   mupirocin ointment (BACTROBAN) 2 % Place into the nose 2 (two) times daily. (Patient not taking: Reported on 05/22/2022) 22 g 0 Not Taking   oxymetazoline (AFRIN) 0.05 % nasal spray Place 1 spray into both nostrils 2 (two) times daily. (Patient not taking: Reported on 05/22/2022)   Not Taking   sodium bicarbonate 650 MG tablet Take 1,300 mg by mouth daily. (Patient not taking: Reported on 05/22/2022)   Not Taking    Social History   Socioeconomic History   Marital status: Divorced     Spouse name: Not on file   Number of children: 3   Years of education: Not on file   Highest education level: Not on file  Occupational History   Occupation: Retired   Tobacco Use   Smoking status: Former    Packs/day: 0.50    Years: 20.00    Total pack years: 10.00    Types: Cigarettes    Quit date: 07/02/2012    Years since quitting: 9.9   Smokeless tobacco: Current    Types: Snuff  Vaping Use   Vaping Use: Never used  Substance and Sexual Activity   Alcohol use: Yes    Alcohol/week: 3.0 standard drinks of alcohol    Types: 3 Cans of beer per week    Comment: Occasionally beer   Drug use: No  Sexual activity: Not Currently  Other Topics Concern   Not on file  Social History Narrative   Not on file   Social Determinants of Health   Financial Resource Strain: High Risk (07/05/2018)   Overall Financial Resource Strain (CARDIA)    Difficulty of Paying Living Expenses: Hard  Food Insecurity: Food Insecurity Present (07/05/2018)   Hunger Vital Sign    Worried About Running Out of Food in the Last Year: Often true    Ran Out of Food in the Last Year: Often true  Transportation Needs: No Transportation Needs (02/28/2022)   PRAPARE - Hydrologist (Medical): No    Lack of Transportation (Non-Medical): No  Physical Activity: Insufficiently Active (07/05/2018)   Exercise Vital Sign    Days of Exercise per Week: 2 days    Minutes of Exercise per Session: 30 min  Stress: No Stress Concern Present (07/05/2018)   [place]    Feeling of Stress : Not at all  Social Connections: Somewhat Isolated (07/05/2018)   Social Connection and Isolation Panel [NHANES]    Frequency of Communication with Friends and Family: More than three times a week    Frequency of Social Gatherings with Friends and Family: More than three times a week    Attends Religious Services: 1 to 4 times per year    Active  Member of [employer] or Organizations: No    Attends Archivist Meetings: Never    Marital Status: Divorced  Human resources officer Violence: Not At Risk (07/05/2018)   Humiliation, Afraid, Rape, and Kick questionnaire    Fear of Current or Ex-Partner: No    Emotionally Abused: No    Physically Abused: No    Sexually Abused: No    Family History  Problem Relation Age of Onset   Breast cancer Mother 89   Cancer Mother        Breast    Cirrhosis Father    Breast cancer Paternal Aunt 41   Cancer Maternal Aunt        Breast       PHYSICAL EXAM General: elderly and chronically ill appearing black female, in no acute distress. Sitting upright in PCU bed HEENT:  Normocephalic and atraumatic. Neck:  No JVD.  Lungs: Normal respiratory effort on 3L by [place]. Bibasilar crackles L worse than R  Heart: HRRR . Normal S1 and S2. 3/6 systolic murmur best heard at RUSB.   Abdomen: Non-distended appearing.  Msk: Normal strength and tone for age. Extremities: Warm and well perfused. No clubbing, cyanosis. No peripheral edema.  Neuro: Alert and oriented X 3. Psych:  Answers questions appropriately.   Labs:   Lab Results  Component Value Date   WBC 4.9 05/25/2022   HGB 7.9 (L) 05/25/2022   HCT 23.9 (L) 05/25/2022   MCV 97.2 05/25/2022   PLT 214 05/25/2022    Recent Labs  Lab 05/23/22 0554 05/24/22 0643 05/25/22 0624  NA 137   < > 134*  K 3.8   < > 4.3  CL 104   < > 106  CO2 26   < > 23  BUN 38*   < > 37*  CREATININE 2.44*   < > 2.52*  CALCIUM 8.9   < > 8.9  PROT 6.4*  --   --   BILITOT 1.0  --   --   ALKPHOS 56  --   --   ALT 16  --   --  AST 29  --   --   GLUCOSE 81   < > 86   < > = values in this interval not displayed.    Lab Results  Component Value Date   CKTOTAL 207 06/26/2013   CKMB 1.9 06/27/2013   TROPONINI <0.03 12/24/2018    Lab Results  Component Value Date   CHOL 152 01/21/2020   Lab Results  Component Value Date   HDL 87 01/21/2020   Lab Results   Component Value Date   LDLCALC 53 01/21/2020   Lab Results  Component Value Date   TRIG 95 11/06/2021   TRIG 57 11/02/2021   TRIG 60 01/21/2020   Lab Results  Component Value Date   CHOLHDL 1.7 01/21/2020   No results found for: "LDLDIRECT"    Radiology: US THORACENTESIS ASP PLEURAL SPACE W/IMG GUIDE  Result Date: 05/23/2022 INDICATION: Patient with history of known atrial fibrillation, CKD, CHF and history of adenocarcinoma of right lung status post radiation. Patient has recurrent right pleural effusion. Request for therapeutic right thoracentesis. EXAM: ULTRASOUND GUIDED THERAPEUTIC RIGHT THORACENTESIS MEDICATIONS: 10 mL 1 % lidocaine COMPLICATIONS: None immediate. PROCEDURE: An ultrasound guided thoracentesis was thoroughly discussed with the patient and questions answered. The benefits, risks, alternatives and complications were also discussed. The patient understands and wishes to proceed with the procedure. Written consent was obtained. Ultrasound was performed to localize and mark an adequate pocket of fluid in the right chest. The area was then prepped and draped in the normal sterile fashion. 1% Lidocaine was used for local anesthesia. Under ultrasound guidance a 6 Fr Safe-T-Centesis catheter was introduced. Thoracentesis was performed. The catheter was removed and a dressing applied. FINDINGS: A total of approximately 900 cc of clear, amber fluid was removed. IMPRESSION: Successful ultrasound guided right thoracentesis yielding 900 cc of pleural fluid. Read by: Narda Rutherford, AGNP-BC Electronically Signed   By: Albin Felling M.D.   On: 05/23/2022 08:49   DG Chest Port 1 View  Result Date: 05/22/2022 CLINICAL DATA:  Right-sided thoracentesis. EXAM: PORTABLE CHEST 1 VIEW COMPARISON:  Earlier same day. FINDINGS: Significant interval improvement patient's right-sided effusion with only small amount of residual right pleural fluid noted. Patient is post right-sided thoracentesis. No  evidence of pneumothorax. Stable density over the right mid to upper lung as well as stable prominence of the right hilum. Left lung is clear. Cardiomediastinal silhouette and remainder of the exam is unchanged. IMPRESSION: 1. Significant interval improvement in patient's right-sided pleural effusion post thoracentesis. No pneumothorax. Minimal residual right pleural fluid. 2. Stable changes over the right mid to upper lung and stable prominence of the right hilum. Electronically Signed   By: Marin Olp M.D.   On: 05/22/2022 17:58   DG Chest Portable 1 View  Result Date: 05/22/2022 CLINICAL DATA:  SOB EXAM: PORTABLE CHEST 1 VIEW COMPARISON:  Chest x-ray 05/08/2022, CT chest 02/21/2022 FINDINGS: The heart and mediastinal contours are unchanged. Atherosclerotic plaque. No focal consolidation. Chronic coarsened markings with no overt pulmonary edema. Interval increase in a small to moderate volume right pleural effusion. No pneumothorax. No acute osseous abnormality. IMPRESSION: 1. Interval increase in a small to moderate right pleural effusion. 2. Aortic Atherosclerosis (ICD10-I70.0) and Emphysema (ICD10-J43.9). Electronically Signed   By: Iven Finn M.D.   On: 05/22/2022 16:25   DG Chest Port 1 View  Result Date: 05/08/2022 CLINICAL DATA:  Status post thoracentesis. EXAM: PORTABLE CHEST 1 VIEW COMPARISON:  Chest x-ray, same date. FINDINGS: Interval evacuation of the right  pleural fluid collection. No postprocedural pneumothorax. IMPRESSION: Status post right thoracentesis. No residual pleural fluid and no pneumothorax. Electronically Signed   By: Marijo Sanes M.D.   On: 05/08/2022 12:18   US THORACENTESIS ASP PLEURAL SPACE W/IMG GUIDE  Result Date: 05/08/2022 INDICATION: Patient with recurrent right pleural effusion, request received for diagnostic and therapeutic thoracentesis. Patient with known atrial fibrillation, chronic kidney disease, history of adenocarcinoma of the right lung status post  radiation and congestive heart failure. EXAM: ULTRASOUND GUIDED RIGHT THORACENTESIS MEDICATIONS: Local 1% lidocaine only. COMPLICATIONS: None immediate. PROCEDURE: An ultrasound guided thoracentesis was thoroughly discussed with the patient and questions answered. The benefits, risks, alternatives and complications were also discussed. The patient understands and wishes to proceed with the procedure. Written consent was obtained. Ultrasound was performed to localize and mark an adequate pocket of fluid in the right chest. The area was then prepped and draped in the normal sterile fashion. 1% Lidocaine was used for local anesthesia. Under ultrasound guidance a 19 gauge, 7-cm, Yueh catheter was introduced. Thoracentesis was performed. The catheter was removed and a dressing applied. FINDINGS: A total of approximately 1.2 L of clear yellow fluid was removed. Samples were sent to the laboratory as requested by the clinical team. IMPRESSION: Successful ultrasound guided right thoracentesis yielding 1.2 L of pleural fluid. This exam was performed by Tsosie Billing PA-C, and was supervised and interpreted by Dr. Kathlene Cote. Electronically Signed   By: Aletta Edouard M.D.   On: 05/08/2022 12:10   CT ABDOMEN PELVIS WO CONTRAST  Result Date: 05/08/2022 CLINICAL DATA:  Acute non localized abdominal pain for several days. Anorexia. EXAM: CT ABDOMEN AND PELVIS WITHOUT CONTRAST TECHNIQUE: Multidetector CT imaging of the abdomen and pelvis was performed following the standard protocol without IV contrast. RADIATION DOSE REDUCTION: This exam was performed according to the departmental dose-optimization program which includes automated exposure control, adjustment of the mA and/or kV according to patient size and/or use of iterative reconstruction technique. COMPARISON:  04/13/2022 FINDINGS: Lower chest: Moderate right pleural effusion, without significant change. Hepatobiliary: Small hepatic cysts remains stable. No new or  enlarging liver lesions seen on this unenhanced exam. High attenuation gallbladder sludge again noted, however there is no evidence of cholecystitis or biliary ductal dilatation. Pancreas: No mass or inflammatory process visualized on this unenhanced exam. Spleen:  Within normal limits in size. Adrenals/Urinary tract: No evidence of urolithiasis or hydronephrosis. Unremarkable unopacified urinary bladder. Stomach/Bowel: No evidence of obstruction, inflammatory process, or abnormal fluid collections. Diverticulosis is seen mainly involving the sigmoid colon, however there is no evidence of diverticulitis. Vascular/Lymphatic: No pathologically enlarged lymph nodes identified. No evidence of abdominal aortic aneurysm. Aortic atherosclerotic calcification incidentally noted. Reproductive: No mass or other significant abnormality. Bilateral tubal ligation clips are noted. Other:  None. Musculoskeletal:  No suspicious bone lesions identified. IMPRESSION: No evidence of urolithiasis, hydronephrosis, or other acute findings. Colonic diverticulosis, without radiographic evidence of diverticulitis. Stable moderate right pleural effusion. Electronically Signed   By: Marlaine Hind M.D.   On: 05/08/2022 09:12   CT HEAD WO CONTRAST (5MM)  Result Date: 05/08/2022 CLINICAL DATA:  Dizziness, persistent/recurrent, cardiac or vascular cause suspected EXAM: CT HEAD WITHOUT CONTRAST TECHNIQUE: Contiguous axial images were obtained from the base of the skull through the vertex without intravenous contrast. RADIATION DOSE REDUCTION: This exam was performed according to the departmental dose-optimization program which includes automated exposure control, adjustment of the mA and/or kV according to patient size and/or use of iterative reconstruction technique. COMPARISON:  01/29/2022  FINDINGS: Brain: No evidence of acute infarction, hemorrhage, hydrocephalus, extra-axial collection or mass lesion/mass effect. Scattered low-density  changes within the periventricular and subcortical white matter compatible with chronic microvascular ischemic change. Mild diffuse cerebral volume loss. Vascular: Atherosclerotic calcifications involving the large vessels of the skull base. No unexpected hyperdense vessel. Skull: Normal. Negative for fracture or focal lesion. Sinuses/Orbits: No acute finding. Other: None. IMPRESSION: 1. No acute intracranial abnormality. 2. Mild chronic microvascular ischemic change and cerebral volume loss. Electronically Signed   By: Davina Poke D.O.   On: 05/08/2022 09:10   DG Chest Portable 1 View  Result Date: 05/08/2022 CLINICAL DATA:  Shortness of breath EXAM: PORTABLE CHEST 1 VIEW COMPARISON:  Chest x-ray dated April 30, 2021 FINDINGS: Cardiac and mediastinal contours are unchanged. Background emphysema. Stable right perihilar and right upper lobe nodular opacities. No new parenchymal opacity. Stable small right pleural effusion. No evidence of pneumothorax. IMPRESSION: Stable small right pleural effusion and right lung nodular opacities. Electronically Signed   By: Yetta Glassman M.D.   On: 05/08/2022 08:34   DG Chest 2 View  Result Date: 04/30/2022 CLINICAL DATA:  Chest pressure. EXAM: CHEST - 2 VIEW COMPARISON:  Chest radiograph dated 04/13/2022. FINDINGS: Slight improvement of vascular congestion since the prior radiograph. Similar appearance of right pleural effusion and right lung base atelectasis. Pneumonia is not excluded. The left lung is clear. No pneumothorax. Mild cardiomegaly. Atherosclerotic calcification of the aorta. No acute osseous pathology. IMPRESSION: 1. Slight improvement of vascular congestion. 2. Similar appearance of right pleural effusion and right lung base atelectasis. Electronically Signed   By: Anner Crete M.D.   On: 04/30/2022 01:20    ECHO 01/25/2022 IMPRESSIONS     1. Ant/apical/setal hpo.   2. Left ventricular ejection fraction, by estimation, is 35 to 40%. The   left ventricle has moderately decreased function. The left ventricle  demonstrates regional wall motion abnormalities (see scoring  diagram/findings for description). The left  ventricular internal cavity size was mildly dilated. Left ventricular  diastolic parameters are consistent with Grade II diastolic dysfunction  (pseudonormalization).   3. Right ventricular systolic function is low normal. The right  ventricular size is normal.   4. The mitral valve is normal in structure. Moderate mitral valve  regurgitation.   5. Tricuspid valve regurgitation is moderate to severe.   6. The aortic valve is normal in structure. Aortic valve regurgitation is  not visualized. Moderate aortic valve stenosis.   FINDINGS   Left Ventricle: Left ventricular ejection fraction, by estimation, is 35  to 40%. The left ventricle has moderately decreased function. The left  ventricle demonstrates regional wall motion abnormalities. The left  ventricular internal cavity size was  mildly dilated. There is borderline concentric left ventricular  hypertrophy. Left ventricular diastolic parameters are consistent with  Grade II diastolic dysfunction (pseudonormalization).   Right Ventricle: The right ventricular size is normal. No increase in  right ventricular wall thickness. Right ventricular systolic function is  low normal.   Left Atrium: Left atrial size was normal in size.   Right Atrium: Right atrial size was not assessed.   Pericardium: There is no evidence of pericardial effusion.   Mitral Valve: The mitral valve is normal in structure. Moderate mitral  valve regurgitation.   Tricuspid Valve: The tricuspid valve is grossly normal. Tricuspid valve  regurgitation is moderate to severe.   Aortic Valve: The aortic valve is normal in structure. Aortic valve  regurgitation is not visualized. Moderate aortic stenosis is  present.  Aortic valve mean gradient measures 6.3 mmHg. Aortic valve peak  gradient  measures 11.6 mmHg. Aortic valve area, by  VTI measures 0.80 cm.   Pulmonic Valve: The pulmonic valve was normal in structure. Pulmonic valve  regurgitation is trivial.   Aorta: The ascending aorta was not well visualized.   IAS/Shunts: No atrial level shunt detected by color flow Doppler.   Additional Comments: Ant/apical/setal hpo.      LEFT VENTRICLE  PLAX 2D  LVIDd:         3.90 cm  LVIDs:         3.00 cm  LV PW:         1.30 cm  LV IVS:        0.85 cm  LVOT diam:     2.00 cm  LV SV:         20  LV SV Index:   12  LVOT Area:     3.14 cm     LV Volumes (MOD)  LV vol d, MOD A2C: 35.6 ml  LV vol d, MOD A4C: 62.7 ml  LV vol s, MOD A2C: 23.2 ml  LV vol s, MOD A4C: 42.8 ml  LV SV MOD A2C:     12.4 ml  LV SV MOD A4C:     62.7 ml  LV SV MOD BP:      18.0 ml   RIGHT VENTRICLE  RV Basal diam:  3.70 cm  RV S prime:     10.20 cm/s  TAPSE (M-mode): 1.1 cm   LEFT ATRIUM           Index        RIGHT ATRIUM           Index  LA diam:      3.20 cm 2.00 cm/m   RA Area:     21.70 cm  LA Vol (A2C): 48.6 ml 30.37 ml/m  RA Volume:   71.60 ml  44.74 ml/m  LA Vol (A4C): 15.7 ml 9.81 ml/m   AORTIC VALVE                     PULMONIC VALVE  AV Area (Vmax):    0.81 cm      PV Vmax:        0.39 m/s  AV Area (Vmean):   0.84 cm      PV Vmean:       27.500 cm/s  AV Area (VTI):     0.80 cm      PV VTI:         0.051 m  AV Vmax:           170.00 cm/s   PV Peak grad:   0.6 mmHg  AV Vmean:          114.667 cm/s  PV Mean grad:   0.0 mmHg  AV VTI:            0.247 m       RVOT Peak grad: 2 mmHg  AV Peak Grad:      11.6 mmHg  AV Mean Grad:      6.3 mmHg  LVOT Vmax:         43.70 cm/s  LVOT Vmean:        30.800 cm/s  LVOT VTI:          0.063 m  LVOT/AV VTI ratio: 0.25     AORTA  Ao Root diam: 3.00  cm   MITRAL VALVE               TRICUSPID VALVE  MV Area (PHT): 8.34 cm    TR Peak grad:   42.5 mmHg  MV Decel Time: 91 msec     TR Vmax:        326.00 cm/s  MV E velocity:  70.30 cm/s  MV A velocity: 57.00 cm/s  SHUNTS  MV E/A ratio:  1.23        Systemic VTI:  0.06 m                             Systemic Diam: 2.00 cm                             Pulmonic VTI:  0.086 m   Yolonda Kida MD  Electronically signed by Yolonda Kida MD  Signature Date/Time: 01/30/2022/1:54:44 PM   TELEMETRY reviewed by me: Periods of tachycardia and what looked like atrial flutter into the 120s, converted to sinus rhythm this morning with rate in the 70s.  EKG reviewed by me: Initially atrial fibrillation rate 104 nonspecific T wave abnormality, repeat 8/9 sinus rhythm rate 74.  ASSESSMENT AND PLAN:  Danielle [name] is a 66 year old female with a past medical history of advanced COPD on 3L O2, paroxysmal atrial fibrillation/flutter (dose reduced eliquis held due to recent epistaxis), HFrEF (LVEF 35-40%, g2dd, mod-sev TR, mod AS, mod MS 01/2022), recurrent Right pleural effusion, CKD (baseline Cr. 2-2.5), remote history of lung cancer s/p radiation, esophageal cancer s/p radiation, MGUS, who presented to [facility] ED 05/22/22 from Dr. Alveria Apley office with worsening shortness of breath. Cardiology is consulted for further assistance.   # Acute on chronic HFrEF (LVEF 35-40%) # Moderate aortic stenosis # Acute on chronic respiratory failure # Recurrent right pleural effusion The patient has had multiple admissions over the past year with acute on chronic HFrEF/respiratory failure complicated by advanced COPD, moderate aortic stenosis, and CKD 4.  She says she has not been making as much urine with her home torsemide 40 mg daily but fortunately she feels better and is back to her recent baseline oxygen requirement of 3 L after right thoracentesis yesterday.  Her BNP is markedly elevated at 1900 and she remains with crackles in her lungs bilaterally, worse on the left. -S/p right thoracentesis with 948m of fluid removed on 8/8 -S/p 40 mg IV Lasix x 2, s/p metolazone 2.5 mg x 1 with minimal  urine output per patient.   -Continue to hold diuresis as renal function is stable, although worse on admission.  -Consider nephrology involvement versus gentle fluid bolus -Continue GDMT with metoprolol tartrate 25 mg twice daily as her blood pressure allows -Continue Isordil 5 mg 3 times daily and hydralazine 10 mg 3 times daily for further afterload reduction -Defer ACE inhibitor/ARB, spironolactone, Jardiance due to CKD -Strict I's/O, daily weights  # Paroxysmal atrial fibrillation/flutter Was in atrial fibrillation on presentation with heart rate in the 120s, converted to sinus rhythm with rate in the 70s to 80s the morning of 8/9.  Notably, she has been off of dose reduced Eliquis since discharge on 7/29 due to significant epistaxis, cauterized by ENT and was told to follow-up with her regular cardiologist to determine when to restart. -Hemoglobin trending 9.7-8.0-8.5-7.9 without obvious signs of bleeding -Continue Eliquis at 2.5 mg twice daily today -Recommend  humidified oxygen if possible while inpatient, and as needed Afrin for maximum of 3 days -Continue amiodarone 239m once daily   # CKD4 Renal function worsening from her baseline on admission, continue to monitor daily   This patient's plan of care was discussed and created with Dr. KNehemiah Massedand he is in agreement.  Signed: LTristan Schroeder, PA-C 05/25/2022, 10:05 AM [facility]  The patient has had a significant improvements in her overall wellbeing.  She does remain on 4 L of oxygen but this is from home.  Additionally her creatinine has stabilized with no evidence of further need and diuresis at this time.  Lungs continue to be relatively clear although some basilar crackles.  Telemetry has shown remained in normal sinus rhythm.  It does appear that she has maximized her hospital benefit at this time although cannot find a primary origin for her dizziness.  The patient has been interviewed and examined. I  agree with assessment and plan above. BSerafina RoyalsMD FProvidence [facility]

## 2022-05-25 NOTE — Progress Notes (Signed)
Mobility Specialist - Progress Note    05/25/22 1027  Mobility  Activity Stood at bedside;Dangled on edge of bed;Ambulated independently in hallway  Level of Assistance Standby assist, set-up cues, supervision of patient - no hands on  Assistive Device Front wheel walker  Distance Ambulated (ft) 80 ft  Activity Response Tolerated well  $Mobility charge 1 Mobility   Pt EOB on 3L upon arrival. Pt STS and ambulates indep with chair follow and slow gait. Pt returns to room with needs in reach.   Danielle [name]  Mobility Specialist  05/25/22 10:30 AM

## 2022-05-26 DIAGNOSIS — J9621 Acute and chronic respiratory failure with hypoxia: Secondary | ICD-10-CM

## 2022-05-26 DIAGNOSIS — I5043 Acute on chronic combined systolic (congestive) and diastolic (congestive) heart failure: Secondary | ICD-10-CM | POA: Diagnosis not present

## 2022-05-26 DIAGNOSIS — N189 Chronic kidney disease, unspecified: Secondary | ICD-10-CM | POA: Diagnosis not present

## 2022-05-26 DIAGNOSIS — N179 Acute kidney failure, unspecified: Secondary | ICD-10-CM | POA: Diagnosis not present

## 2022-05-26 LAB — CBC
HCT: 26.2 % — ABNORMAL LOW (ref 36.0–46.0)
Hemoglobin: 8.5 g/dL — ABNORMAL LOW (ref 12.0–15.0)
MCH: 31.6 pg (ref 26.0–34.0)
MCHC: 32.4 g/dL (ref 30.0–36.0)
MCV: 97.4 fL (ref 80.0–100.0)
Platelets: 252 10*3/uL (ref 150–400)
RBC: 2.69 MIL/uL — ABNORMAL LOW (ref 3.87–5.11)
RDW: 14.7 % (ref 11.5–15.5)
WBC: 4.3 10*3/uL (ref 4.0–10.5)
nRBC: 0.7 % — ABNORMAL HIGH (ref 0.0–0.2)

## 2022-05-26 LAB — BASIC METABOLIC PANEL
Anion gap: 6 (ref 5–15)
BUN: 38 mg/dL — ABNORMAL HIGH (ref 8–23)
CO2: 23 mmol/L (ref 22–32)
Calcium: 9.1 mg/dL (ref 8.9–10.3)
Chloride: 105 mmol/L (ref 98–111)
Creatinine, Ser: 2.45 mg/dL — ABNORMAL HIGH (ref 0.44–1.00)
GFR, Estimated: 21 mL/min — ABNORMAL LOW (ref >60)
Glucose, Bld: 86 mg/dL (ref 70–99)
Potassium: 4.6 mmol/L (ref 3.5–5.1)
Sodium: 134 mmol/L — ABNORMAL LOW (ref 135–145)

## 2022-05-26 LAB — MAGNESIUM: Magnesium: 2.7 mg/dL — ABNORMAL HIGH (ref 1.7–2.4)

## 2022-05-26 MED ORDER — APIXABAN 2.5 MG PO TABS
2.5000 mg | ORAL_TABLET | Freq: Two times a day (BID) | ORAL | 0 refills | Status: AC
Start: 2022-05-26 — End: ?

## 2022-05-26 MED ORDER — TORSEMIDE 40 MG PO TABS: 20.0000 mg | ORAL_TABLET | Freq: Every day | ORAL | Status: AC

## 2022-05-26 MED ORDER — HYDRALAZINE HCL 10 MG PO TABS
10.0000 mg | ORAL_TABLET | Freq: Three times a day (TID) | ORAL | 0 refills | Status: AC
Start: 2022-05-26 — End: ?

## 2022-05-26 MED ORDER — ISOSORBIDE DINITRATE 5 MG PO TABS: 5.0000 mg | ORAL_TABLET | Freq: Three times a day (TID) | ORAL | 0 refills | Status: AC

## 2022-05-26 MED ORDER — MECLIZINE HCL 25 MG PO TABS
12.5000 mg | ORAL_TABLET | Freq: Two times a day (BID) | ORAL | Status: DC | PRN
  Administered 2022-05-26: 12.5 mg via ORAL
  Filled 2022-05-26 (×2): qty 0.5

## 2022-05-26 NOTE — Discharge Summary (Signed)
Physician Discharge Summary  Danielle [name] IOE:[id number] DOB: 1956-05-03 DOA: 05/22/2022  PCP: [name], PA  Admit date: 05/22/2022 Discharge date: 05/26/2022  Admitted From: home Disposition:  home   Recommendations for Outpatient Follow-up:  Follow up with PCP in 1-2 weeks F/u w/ cardio, Dr. [name], w/in 1 week   Home Health: no  Equipment/Devices: chronically on 3-4L [place]   Discharge Condition: stable  CODE STATUS: full  Diet recommendation: Heart Healthy  Brief/Interim Summary: HPI was taken from Dr. Florina Ou: Danielle [name] is a 66 y.o. female seen in ed with complaints of SOB on her baseline 4L [place]. Pt  has h/o lung cancer and esophageal cancer and chf. She had pleural effusion and was said to be transudative and f/u with dr Raul Del and dr. Erin Fulling for cardiology.She also has h/o a.fib and on eliquis and had recent admission fro epistaxis and was discharged less than one week ago.  Pt has past medical history as below of anemia/a/fib/ chf etc.   As per Dr. Jimmye Norman 8/9-8/12/23: Pt presented w/ shortness of breath likely secondary to CHF exacerbation & recurrent right pleural effusion. Pt had right thoracentesis while inpatient which improved pt's shortness of breath. Pt also received diuresis w/ lasix as well as with metolazone as per cardio. Of note, pt was restarted back on eliquis at 2.5 mg BID as per cardio. For more information, please see previous progress/consult notes.   Discharge Diagnoses:  Principal Problem:   SOB (shortness of breath) Active Problems:   Acute on chronic respiratory failure with hypoxia (HCC)   Recurrent right pleural effusion   Acute on chronic combined systolic and diastolic CHF (congestive heart failure) (HCC)   Paroxysmal atrial fibrillation (HCC)   Acute kidney injury superimposed on CKD (HCC)   Essential hypertension   GERD (gastroesophageal reflux disease)   Dyspnea  Dyspnea: likely secondary to recurrent right pleural effusion  & CHF. S/p right thoracentesis. Much improved    Recurrent right pleural effusion: improved after right thoracentesis    Acute on chronic hypoxic respiratory failure: continue on supplemental oxygen. Initially on BiPAP but has since been weaned off. Continue on 3L [place] and usually uses 3-4L [place] at home    PAF: continue on eliquis, metoprolol & amiodarone     Acute on chronic combined CHF: continue on BB, statin. Continue to hold all diuretics. Monitor I/Os. Cardio following and recs apprec   AKI on CKDIV:  Cr continues to trend down again today     ACD: likely secondary to CKD. H&H are labile     HTN: continue on metoprolol and started on hydralazine, isordil as per cardio    GERD: continue on PPI    Hx of lung & esophageal cancer: management per onco outpatient    Depression: severity unknown. Continue on home dose of citalopram   Discharge Instructions  Discharge Instructions     Diet - low sodium heart healthy   Complete by: As directed    Discharge instructions   Complete by: As directed    F/u w/ PCP in 1-2 weeks. F/u w/ cardio, Dr. [name], w/in 1 week.   Increase activity slowly   Complete by: As directed       Allergies as of 05/26/2022   No Known Allergies      Medication List     TAKE these medications    acetaminophen 325 MG tablet Commonly known as: TYLENOL Take 2 tablets (650 mg total) by mouth every 6 (six) hours  as needed for mild pain (or Fever >/= 101). What changed:  when to take this reasons to take this   albuterol 108 (90 Base) MCG/ACT inhaler Commonly known as: VENTOLIN HFA Inhale 2 puffs into the lungs every 6 (six) hours as needed for wheezing or shortness of breath.   amiodarone 200 MG tablet Commonly known as: PACERONE Take 1 tablet (200 mg total) by mouth 2 (two) times daily.   apixaban 2.5 MG Tabs tablet Commonly known as: ELIQUIS Take 1 tablet (2.5 mg total) by mouth 2 (two) times daily.   atorvastatin 40 MG tablet Commonly  known as: LIPITOR Take 1 tablet (40 mg total) by mouth daily.   calcium carbonate 500 MG chewable tablet Commonly known as: TUMS - dosed in mg elemental calcium Chew 0.5 tablets by mouth daily as needed.   calcium-vitamin D 500-5 MG-MCG tablet Commonly known as: OSCAL WITH D Take 1 tablet by mouth daily.   citalopram 20 MG tablet Commonly known as: CELEXA Take 1 tablet (20 mg total) by mouth daily. What changed: how much to take   ferrous sulfate 325 (65 FE) MG tablet Take 325 mg by mouth 2 (two) times daily with a meal.   hydrALAZINE 10 MG tablet Commonly known as: APRESOLINE Take 1 tablet (10 mg total) by mouth 3 (three) times daily.   isosorbide dinitrate 5 MG tablet Commonly known as: ISORDIL Take 1 tablet (5 mg total) by mouth 3 (three) times daily.   loperamide 2 MG capsule Commonly known as: IMODIUM Take 2 mg by mouth daily as needed for diarrhea or loose stools.   metoprolol tartrate 25 MG tablet Commonly known as: LOPRESSOR Take 1 tablet (25 mg total) by mouth 2 (two) times daily.   multivitamin with minerals Tabs tablet Take 1 tablet by mouth daily.   mupirocin ointment 2 % Commonly known as: BACTROBAN Place into the nose 2 (two) times daily.   OXYGEN Inhale 4 L into the lungs daily.   oxymetazoline 0.05 % nasal spray Commonly known as: AFRIN Place 1 spray into both nostrils 2 (two) times daily.   pantoprazole 40 MG tablet Commonly known as: Protonix Take 1 tablet (40 mg total) by mouth 2 (two) times daily before a meal.   sodium bicarbonate 650 MG tablet Take 1,300 mg by mouth daily.   sucralfate 1 GM/10ML suspension Commonly known as: CARAFATE Take 0.5 g by mouth 2 (two) times daily.   Torsemide 40 MG Tabs Take 20 mg by mouth daily. Hold this medication until you see your cardiologist What changed:  how much to take additional instructions   Trelegy Ellipta 100-62.5-25 MCG/ACT Aepb Generic drug: Fluticasone-Umeclidin-Vilant Inhale 1 puff  into the lungs daily.   zolpidem 5 MG tablet Commonly known as: AMBIEN Take 5 mg by mouth at bedtime as needed for sleep.        Follow-up Information     Corey Skains, MD. Go in 1 week(s).   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 Patient has recurrent right pleural effusion. Request for therapeutic right thoracentesis. EXAM: ULTRASOUND GUIDED THERAPEUTIC RIGHT THORACENTESIS MEDICATIONS: 10 mL 1 %  lidocaine COMPLICATIONS: None immediate. PROCEDURE: An ultrasound guided thoracentesis was thoroughly discussed with the patient and questions answered. The benefits, risks, alternatives and complications were also discussed. The patient understands and wishes to proceed with the procedure. Written consent was obtained. Ultrasound was performed to localize and mark an adequate pocket of fluid in the right chest. The area was then prepped and draped in the normal sterile fashion. 1% Lidocaine was used for local anesthesia. Under ultrasound guidance a 6 Fr Safe-T-Centesis catheter was introduced. Thoracentesis was performed. The catheter was removed and a dressing applied. FINDINGS: A total of approximately 900 cc of clear, amber fluid was removed. IMPRESSION: Successful ultrasound guided right thoracentesis yielding 900 cc of pleural fluid. Read by: Narda Rutherford, AGNP-BC Electronically Signed   By: Albin Felling M.D.   On: 05/23/2022 08:49   DG Chest Port 1 View  Result Date: 05/22/2022 CLINICAL DATA:  Right-sided thoracentesis. EXAM: PORTABLE CHEST 1 VIEW COMPARISON:  Earlier same day. FINDINGS:  Significant interval improvement patient's right-sided effusion with only small amount of residual right pleural fluid noted. Patient is post right-sided thoracentesis. No evidence of pneumothorax. Stable density over the right mid to upper lung as well as stable prominence of the right hilum. Left lung is clear. Cardiomediastinal silhouette and remainder of the exam is unchanged. IMPRESSION: 1. Significant interval improvement in patient's right-sided pleural effusion post thoracentesis. No pneumothorax. Minimal residual right pleural fluid. 2. Stable changes over the right mid to upper lung and stable prominence of the right hilum. Electronically Signed   By: Marin Olp M.D.   On: 05/22/2022 17:58   DG Chest Portable 1 View  Result Date: 05/22/2022 CLINICAL DATA:  SOB EXAM: PORTABLE CHEST 1 VIEW COMPARISON:  Chest x-ray 05/08/2022, CT chest 02/21/2022 FINDINGS: The heart and mediastinal contours are unchanged. Atherosclerotic plaque. No focal consolidation. Chronic coarsened markings with no overt pulmonary edema. Interval increase in a small to moderate volume right pleural effusion. No pneumothorax. No acute osseous abnormality. IMPRESSION: 1. Interval increase in a small to moderate right pleural effusion. 2. Aortic Atherosclerosis (ICD10-I70.0) and Emphysema (ICD10-J43.9). Electronically Signed   By: Iven Finn M.D.   On: 05/22/2022 16:25   DG Chest Port 1 View  Result Date: 05/08/2022 CLINICAL DATA:  Status post thoracentesis. EXAM: PORTABLE CHEST 1 VIEW COMPARISON:  Chest x-ray, same date. FINDINGS: Interval evacuation of the right pleural fluid collection. No postprocedural pneumothorax. IMPRESSION: Status post right thoracentesis. No residual pleural fluid and no pneumothorax. Electronically Signed   By: Marijo Sanes M.D.   On: 05/08/2022 12:18   US THORACENTESIS ASP PLEURAL SPACE W/IMG GUIDE  Result Date: 05/08/2022 INDICATION: Patient with recurrent right pleural effusion, request  received for diagnostic and therapeutic thoracentesis. Patient with known atrial fibrillation, chronic kidney disease, history of adenocarcinoma of the right lung status post radiation and congestive heart failure. EXAM: ULTRASOUND GUIDED RIGHT THORACENTESIS MEDICATIONS: Local 1% lidocaine only. COMPLICATIONS: None immediate. PROCEDURE: An ultrasound guided thoracentesis was thoroughly discussed with the patient and questions answered. The benefits, risks, alternatives and complications were also discussed. The patient understands and wishes to proceed with the procedure. Written consent was obtained. Ultrasound was performed to localize and mark an adequate pocket of fluid in the right chest. The area was then prepped and draped in the normal sterile fashion. 1% Lidocaine was used for local anesthesia. Under ultrasound guidance a 19 gauge, 7-cm, Yueh catheter was introduced. Thoracentesis was performed. The catheter was removed and a dressing applied. FINDINGS: A total  of approximately 1.2 L of clear yellow fluid was removed. Samples were sent to the laboratory as requested by the clinical team. IMPRESSION: Successful ultrasound guided right thoracentesis yielding 1.2 L of pleural fluid. This exam was performed by Tsosie Billing PA-C, and was supervised and interpreted by Dr. Kathlene Cote. Electronically Signed   By: Aletta Edouard M.D.   On: 05/08/2022 12:10   CT ABDOMEN PELVIS WO CONTRAST  Result Date: 05/08/2022 CLINICAL DATA:  Acute non localized abdominal pain for several days. Anorexia. EXAM: CT ABDOMEN AND PELVIS WITHOUT CONTRAST TECHNIQUE: Multidetector CT imaging of the abdomen and pelvis was performed following the standard protocol without IV contrast. RADIATION DOSE REDUCTION: This exam was performed according to the departmental dose-optimization program which includes automated exposure control, adjustment of the mA and/or kV according to patient size and/or use of iterative reconstruction technique.  COMPARISON:  04/13/2022 FINDINGS: Lower chest: Moderate right pleural effusion, without significant change. Hepatobiliary: Small hepatic cysts remains stable. No new or enlarging liver lesions seen on this unenhanced exam. High attenuation gallbladder sludge again noted, however there is no evidence of cholecystitis or biliary ductal dilatation. Pancreas: No mass or inflammatory process visualized on this unenhanced exam. Spleen:  Within normal limits in size. Adrenals/Urinary tract: No evidence of urolithiasis or hydronephrosis. Unremarkable unopacified urinary bladder. Stomach/Bowel: No evidence of obstruction, inflammatory process, or abnormal fluid collections. Diverticulosis is seen mainly involving the sigmoid colon, however there is no evidence of diverticulitis. Vascular/Lymphatic: No pathologically enlarged lymph nodes identified. No evidence of abdominal aortic aneurysm. Aortic atherosclerotic calcification incidentally noted. Reproductive: No mass or other significant abnormality. Bilateral tubal ligation clips are noted. Other:  None. Musculoskeletal:  No suspicious bone lesions identified. IMPRESSION: No evidence of urolithiasis, hydronephrosis, or other acute findings. Colonic diverticulosis, without radiographic evidence of diverticulitis. Stable moderate right pleural effusion. Electronically Signed   By: Marlaine Hind M.D.   On: 05/08/2022 09:12   CT HEAD WO CONTRAST (5MM)  Result Date: 05/08/2022 CLINICAL DATA:  Dizziness, persistent/recurrent, cardiac or vascular cause suspected EXAM: CT HEAD WITHOUT CONTRAST TECHNIQUE: Contiguous axial images were obtained from the base of the skull through the vertex without intravenous contrast. RADIATION DOSE REDUCTION: This exam was performed according to the departmental dose-optimization program which includes automated exposure control, adjustment of the mA and/or kV according to patient size and/or use of iterative reconstruction technique. COMPARISON:   01/29/2022 FINDINGS: Brain: No evidence of acute infarction, hemorrhage, hydrocephalus, extra-axial collection or mass lesion/mass effect. Scattered low-density changes within the periventricular and subcortical white matter compatible with chronic microvascular ischemic change. Mild diffuse cerebral volume loss. Vascular: Atherosclerotic calcifications involving the large vessels of the skull base. No unexpected hyperdense vessel. Skull: Normal. Negative for fracture or focal lesion. Sinuses/Orbits: No acute finding. Other: None. IMPRESSION: 1. No acute intracranial abnormality. 2. Mild chronic microvascular ischemic change and cerebral volume loss. Electronically Signed   By: Davina Poke D.O.   On: 05/08/2022 09:10   DG Chest Portable 1 View  Result Date: 05/08/2022 CLINICAL DATA:  Shortness of breath EXAM: PORTABLE CHEST 1 VIEW COMPARISON:  Chest x-ray dated April 30, 2021 FINDINGS: Cardiac and mediastinal contours are unchanged. Background emphysema. Stable right perihilar and right upper lobe nodular opacities. No new parenchymal opacity. Stable small right pleural effusion. No evidence of pneumothorax. IMPRESSION: Stable small right pleural effusion and right lung nodular opacities. Electronically Signed   By: Yetta Glassman M.D.   On: 05/08/2022 08:34   DG Chest 2 View  Result Date: 04/30/2022  CLINICAL DATA:  Chest pressure. EXAM: CHEST - 2 VIEW COMPARISON:  Chest radiograph dated 04/13/2022. FINDINGS: Slight improvement of vascular congestion since the prior radiograph. Similar appearance of right pleural effusion and right lung base atelectasis. Pneumonia is not excluded. The left lung is clear. No pneumothorax. Mild cardiomegaly. Atherosclerotic calcification of the aorta. No acute osseous pathology. IMPRESSION: 1. Slight improvement of vascular congestion. 2. Similar appearance of right pleural effusion and right lung base atelectasis. Electronically Signed   By: Anner Crete M.D.   On:  04/30/2022 01:20   (Echo, Carotid, EGD, Colonoscopy, ERCP)    Subjective: Pt c/o fatigue    Discharge Exam: Vitals:   05/26/22 0856 05/26/22 1126  BP: 109/71 (!) 97/57  Pulse: 62 60  Resp: 18 18  Temp:  98 F (36.7 C)  SpO2: 96% 96%   Vitals:   05/26/22 0446 05/26/22 0600 05/26/22 0856 05/26/22 1126  BP: 105/73  109/71 (!) 97/57  Pulse: 60  62 60  Resp: 16  18 18   Temp: 98 F (36.7 C)   98 F (36.7 C)  TempSrc: Oral   Oral  SpO2: 98%  96% 96%  Weight:  54.8 kg    Height:        General: Pt is alert, awake, not in acute distress Cardiovascular: S1/S2 +, no rubs, no gallops Respiratory: CTA bilaterally, no wheezing, no rhonchi Abdominal: Soft, NT, ND, bowel sounds + Extremities: no cyanosis    The results of significant diagnostics from this hospitalization (including imaging, microbiology, ancillary and laboratory) are listed below for reference.     Microbiology: Recent Results (from the past 240 hour(s))  SARS Coronavirus 2 by RT PCR (hospital order, performed in [facility] hospital lab) *cepheid single result test* Anterior Nasal Swab     Status: None   Collection Time: 05/22/22  9:55 PM   Specimen: Anterior Nasal Swab  Result Value Ref Range Status   SARS Coronavirus 2 by RT PCR NEGATIVE NEGATIVE Final    Comment: (NOTE) SARS-CoV-2 target nucleic acids are NOT DETECTED.  The SARS-CoV-2 RNA is generally detectable in upper and lower respiratory specimens during the acute phase of infection. The lowest concentration of SARS-CoV-2 viral copies this assay can detect is 250 copies / mL. A negative result does not preclude SARS-CoV-2 infection and should not be used as the sole basis for treatment or other patient management decisions.  A negative result may occur with improper specimen collection / handling, submission of specimen other than nasopharyngeal swab, presence of viral mutation(s) within the areas targeted by this assay, and inadequate number of  viral copies (<250 copies / mL). A negative result must be combined with clinical observations, patient history, and epidemiological information.  Fact Sheet for Patients:   https://www.patel.info/  Fact Sheet for Healthcare Providers: https://hall.com/  This test is not yet approved or  cleared by the [place] FDA and has been authorized for detection and/or diagnosis of SARS-CoV-2 by FDA under an Emergency Use Authorization (EUA).  This EUA will remain in effect (meaning this test can be used) for the duration of the COVID-19 declaration under Section 564(b)(1) of the Act, 21 U.S.C. section 360bbb-3(b)(1), unless the authorization is terminated or revoked sooner.  Performed at [facility], [address] [place], [place] [address]      Labs: BNP (last 3 results) Recent Labs    04/13/22 1212 05/08/22 0837 05/22/22 1605  BNP 2,776.3* 1,281.4* 8,466.5*   Basic Metabolic Panel: Recent Labs  Lab 05/22/22 1605 05/23/22 0554 05/24/22 0643 05/25/22 0624 05/26/22 0559  NA 138 137 136 134* 134*  K 4.1 3.8 3.3* 4.3 4.6  CL 100 104 101 106 105  CO2 20* 26 25 23 23   GLUCOSE 62* 81 88 86 86  BUN 37* 38* 36* 37* 38*  CREATININE 2.29* 2.44* 2.54* 2.52* 2.45*  CALCIUM 9.8 8.9 8.7* 8.9 9.1  MG  --   --   --  1.6* 2.7*   Liver Function Tests: Recent Labs  Lab 05/22/22 1605 05/23/22 0554  AST 58* 29  ALT 29 16  ALKPHOS 73 56  BILITOT 1.4* 1.0  PROT 8.5* 6.4*  ALBUMIN 4.3 3.3*   No results for input(s): "LIPASE", "AMYLASE" in the last 168 hours. No results for input(s): "AMMONIA" in the last 168 hours. CBC: Recent Labs  Lab 05/22/22 1605 05/23/22 0554 05/24/22 0643 05/25/22 0624 05/26/22 0559  WBC 7.7 4.6 5.5 4.9 4.3  NEUTROABS 6.2  --   --   --   --   HGB 9.7* 8.0* 8.5* 7.9* 8.5*  HCT 30.1* 23.9* 25.4* 23.9* 26.2*  MCV 99.0 96.4 96.2 97.2 97.4  PLT 321 249 251 214 252   Cardiac Enzymes: No results  for input(s): "CKTOTAL", "CKMB", "CKMBINDEX", "TROPONINI" in the last 168 hours. BNP: Invalid input(s): "POCBNP" CBG: Recent Labs  Lab 05/23/22 0559 05/23/22 1047  GLUCAP 82 116*   D-Dimer No results for input(s): "DDIMER" in the last 72 hours. Hgb A1c No results for input(s): "HGBA1C" in the last 72 hours. Lipid Profile No results for input(s): "CHOL", "HDL", "LDLCALC", "TRIG", "CHOLHDL", "LDLDIRECT" in the last 72 hours. Thyroid function studies No results for input(s): "TSH", "T4TOTAL", "T3FREE", "THYROIDAB" in the last 72 hours.  Invalid input(s): "FREET3" Anemia work up No results for input(s): "VITAMINB12", "FOLATE", "FERRITIN", "TIBC", "IRON", "RETICCTPCT" in the last 72 hours. Urinalysis    Component Value Date/Time   COLORURINE YELLOW (A) 05/08/2022 1550   APPEARANCEUR CLEAR (A) 05/08/2022 1550   APPEARANCEUR Clear 12/06/2013 2245   LABSPEC 1.011 05/08/2022 1550   LABSPEC 1.015 12/06/2013 2245   PHURINE 5.0 05/08/2022 1550   GLUCOSEU NEGATIVE 05/08/2022 1550   GLUCOSEU Negative 12/06/2013 2245   HGBUR NEGATIVE 05/08/2022 1550   BILIRUBINUR NEGATIVE 05/08/2022 1550   BILIRUBINUR Negative 12/06/2013 2245   KETONESUR 5 (A) 05/08/2022 1550   PROTEINUR >=300 (A) 05/08/2022 1550   NITRITE NEGATIVE 05/08/2022 1550   LEUKOCYTESUR NEGATIVE 05/08/2022 1550   LEUKOCYTESUR Negative 12/06/2013 2245   Sepsis Labs Recent Labs  Lab 05/23/22 0554 05/24/22 0643 05/25/22 0624 05/26/22 0559  WBC 4.6 5.5 4.9 4.3   Microbiology Recent Results (from the past 240 hour(s))  SARS Coronavirus 2 by RT PCR (hospital order, performed in [place] hospital lab) *cepheid single result test* Anterior Nasal Swab     Status: None   Collection Time: 05/22/22  9:55 PM   Specimen: Anterior Nasal Swab  Result Value Ref Range Status   SARS Coronavirus 2 by RT PCR NEGATIVE NEGATIVE Final    Comment: (NOTE) SARS-CoV-2 target nucleic acids are NOT DETECTED.  The SARS-CoV-2 RNA is  generally detectable in upper and lower respiratory specimens during the acute phase of infection. The lowest concentration of SARS-CoV-2 viral copies this assay can detect is 250 copies / mL. A negative result does not preclude SARS-CoV-2 infection and should not be used as the sole basis for treatment or other patient management decisions.  A negative result may occur with improper specimen collection /  handling, submission of specimen other than nasopharyngeal swab, presence of viral mutation(s) within the areas targeted by this assay, and inadequate number of viral copies (<250 copies / mL). A negative result must be combined with clinical observations, patient history, and epidemiological information.  Fact Sheet for Patients:   https://www.patel.info/  Fact Sheet for Healthcare Providers: https://hall.com/  This test is not yet approved or  cleared by the [place] FDA and has been authorized for detection and/or diagnosis of SARS-CoV-2 by FDA under an Emergency Use Authorization (EUA).  This EUA will remain in effect (meaning this test can be used) for the duration of the COVID-19 declaration under Section 564(b)(1) of the Act, 21 U.S.C. section 360bbb-3(b)(1), unless the authorization is terminated or revoked sooner.  Performed at [facility], [address] [place], [place] [address]      Time coordinating discharge: Over 30 minutes  SIGNED:   Wyvonnia Dusky, MD  [facility] 05/26/2022, 1:32 PM Pager   If 7PM-7AM, please contact night-coverage [email or web address]

## 2022-06-16 ENCOUNTER — Emergency Department: Payer: Medicare Other

## 2022-06-16 ENCOUNTER — Encounter: Payer: Self-pay | Admitting: Radiology

## 2022-06-16 ENCOUNTER — Inpatient Hospital Stay
Admission: EM | Admit: 2022-06-16 | Discharge: 2022-06-21 | DRG: 193 | Disposition: A | Discharge status: Home / Self Care | Payer: Medicare Other | Attending: Internal Medicine | Admitting: Internal Medicine

## 2022-06-16 DIAGNOSIS — R0602 Shortness of breath: Secondary | ICD-10-CM | POA: Diagnosis not present

## 2022-06-16 DIAGNOSIS — I509 Heart failure, unspecified: Principal | ICD-10-CM

## 2022-06-16 DIAGNOSIS — Z9889 Other specified postprocedural states: Secondary | ICD-10-CM

## 2022-06-16 DIAGNOSIS — Z9221 Personal history of antineoplastic chemotherapy: Secondary | ICD-10-CM

## 2022-06-16 DIAGNOSIS — I482 Chronic atrial fibrillation, unspecified: Secondary | ICD-10-CM | POA: Diagnosis present

## 2022-06-16 DIAGNOSIS — F32A Depression, unspecified: Secondary | ICD-10-CM | POA: Diagnosis present

## 2022-06-16 DIAGNOSIS — I480 Paroxysmal atrial fibrillation: Secondary | ICD-10-CM | POA: Diagnosis present

## 2022-06-16 DIAGNOSIS — D649 Anemia, unspecified: Secondary | ICD-10-CM | POA: Diagnosis present

## 2022-06-16 DIAGNOSIS — Z7951 Long term (current) use of inhaled steroids: Secondary | ICD-10-CM

## 2022-06-16 DIAGNOSIS — Y95 Nosocomial condition: Secondary | ICD-10-CM | POA: Diagnosis present

## 2022-06-16 DIAGNOSIS — Z9981 Dependence on supplemental oxygen: Secondary | ICD-10-CM

## 2022-06-16 DIAGNOSIS — I2721 Secondary pulmonary arterial hypertension: Secondary | ICD-10-CM | POA: Diagnosis present

## 2022-06-16 DIAGNOSIS — I493 Ventricular premature depolarization: Secondary | ICD-10-CM | POA: Diagnosis present

## 2022-06-16 DIAGNOSIS — Z803 Family history of malignant neoplasm of breast: Secondary | ICD-10-CM

## 2022-06-16 DIAGNOSIS — E43 Unspecified severe protein-calorie malnutrition: Secondary | ICD-10-CM | POA: Diagnosis present

## 2022-06-16 DIAGNOSIS — Z8501 Personal history of malignant neoplasm of esophagus: Secondary | ICD-10-CM

## 2022-06-16 DIAGNOSIS — Z85118 Personal history of other malignant neoplasm of bronchus and lung: Secondary | ICD-10-CM

## 2022-06-16 DIAGNOSIS — J9600 Acute respiratory failure, unspecified whether with hypoxia or hypercapnia: Secondary | ICD-10-CM | POA: Diagnosis present

## 2022-06-16 DIAGNOSIS — J440 Chronic obstructive pulmonary disease with acute lower respiratory infection: Secondary | ICD-10-CM | POA: Diagnosis present

## 2022-06-16 DIAGNOSIS — J13 Pneumonia due to Streptococcus pneumoniae: Secondary | ICD-10-CM | POA: Diagnosis not present

## 2022-06-16 DIAGNOSIS — E559 Vitamin D deficiency, unspecified: Secondary | ICD-10-CM | POA: Diagnosis present

## 2022-06-16 DIAGNOSIS — J9621 Acute and chronic respiratory failure with hypoxia: Secondary | ICD-10-CM | POA: Diagnosis present

## 2022-06-16 DIAGNOSIS — I5043 Acute on chronic combined systolic (congestive) and diastolic (congestive) heart failure: Secondary | ICD-10-CM | POA: Diagnosis present

## 2022-06-16 DIAGNOSIS — Z8616 Personal history of COVID-19: Secondary | ICD-10-CM

## 2022-06-16 DIAGNOSIS — Z7901 Long term (current) use of anticoagulants: Secondary | ICD-10-CM

## 2022-06-16 DIAGNOSIS — N184 Chronic kidney disease, stage 4 (severe): Secondary | ICD-10-CM | POA: Diagnosis present

## 2022-06-16 DIAGNOSIS — Z9012 Acquired absence of left breast and nipple: Secondary | ICD-10-CM

## 2022-06-16 DIAGNOSIS — Z87891 Personal history of nicotine dependence: Secondary | ICD-10-CM

## 2022-06-16 DIAGNOSIS — J90 Pleural effusion, not elsewhere classified: Secondary | ICD-10-CM | POA: Diagnosis present

## 2022-06-16 DIAGNOSIS — Z853 Personal history of malignant neoplasm of breast: Secondary | ICD-10-CM

## 2022-06-16 DIAGNOSIS — Z681 Body mass index (BMI) 19 or less, adult: Secondary | ICD-10-CM

## 2022-06-16 DIAGNOSIS — Z9049 Acquired absence of other specified parts of digestive tract: Secondary | ICD-10-CM

## 2022-06-16 DIAGNOSIS — I10 Essential (primary) hypertension: Secondary | ICD-10-CM | POA: Diagnosis present

## 2022-06-16 DIAGNOSIS — Z20822 Contact with and (suspected) exposure to covid-19: Secondary | ICD-10-CM | POA: Diagnosis present

## 2022-06-16 DIAGNOSIS — E785 Hyperlipidemia, unspecified: Secondary | ICD-10-CM | POA: Diagnosis present

## 2022-06-16 DIAGNOSIS — K219 Gastro-esophageal reflux disease without esophagitis: Secondary | ICD-10-CM | POA: Diagnosis present

## 2022-06-16 DIAGNOSIS — J154 Pneumonia due to other streptococci: Secondary | ICD-10-CM

## 2022-06-16 DIAGNOSIS — N1832 Chronic kidney disease, stage 3b: Secondary | ICD-10-CM | POA: Diagnosis present

## 2022-06-16 DIAGNOSIS — Z79899 Other long term (current) drug therapy: Secondary | ICD-10-CM

## 2022-06-16 DIAGNOSIS — I130 Hypertensive heart and chronic kidney disease with heart failure and stage 1 through stage 4 chronic kidney disease, or unspecified chronic kidney disease: Secondary | ICD-10-CM | POA: Diagnosis present

## 2022-06-16 DIAGNOSIS — Z923 Personal history of irradiation: Secondary | ICD-10-CM

## 2022-06-16 LAB — TROPONIN I (HIGH SENSITIVITY): Troponin I (High Sensitivity): 20 ng/L — ABNORMAL HIGH (ref <18)

## 2022-06-16 LAB — BASIC METABOLIC PANEL
Anion gap: 10 (ref 5–15)
BUN: 25 mg/dL — ABNORMAL HIGH (ref 8–23)
CO2: 24 mmol/L (ref 22–32)
Calcium: 8.8 mg/dL — ABNORMAL LOW (ref 8.9–10.3)
Chloride: 103 mmol/L (ref 98–111)
Creatinine, Ser: 1.5 mg/dL — ABNORMAL HIGH (ref 0.44–1.00)
GFR, Estimated: 38 mL/min — ABNORMAL LOW (ref >60)
Glucose, Bld: 84 mg/dL (ref 70–99)
Potassium: 3.2 mmol/L — ABNORMAL LOW (ref 3.5–5.1)
Sodium: 137 mmol/L (ref 135–145)

## 2022-06-16 LAB — HEPATIC FUNCTION PANEL
ALT: 18 U/L (ref 0–44)
AST: 24 U/L (ref 15–41)
Albumin: 3.7 g/dL (ref 3.5–5.0)
Alkaline Phosphatase: 58 U/L (ref 38–126)
Bilirubin, Direct: 0.2 mg/dL (ref 0.0–0.2)
Indirect Bilirubin: 0.5 mg/dL (ref 0.3–0.9)
Total Bilirubin: 0.7 mg/dL (ref 0.3–1.2)
Total Protein: 7.4 g/dL (ref 6.5–8.1)

## 2022-06-16 LAB — CBC WITH DIFFERENTIAL/PLATELET
Abs Immature Granulocytes: 0.02 10*3/uL (ref 0.00–0.07)
Basophils Absolute: 0.1 10*3/uL (ref 0.0–0.1)
Basophils Relative: 1 %
Eosinophils Absolute: 0.1 10*3/uL (ref 0.0–0.5)
Eosinophils Relative: 1 %
HCT: 32 % — ABNORMAL LOW (ref 36.0–46.0)
Hemoglobin: 10.2 g/dL — ABNORMAL LOW (ref 12.0–15.0)
Immature Granulocytes: 0 %
Lymphocytes Relative: 25 %
Lymphs Abs: 1.6 10*3/uL (ref 0.7–4.0)
MCH: 31.1 pg (ref 26.0–34.0)
MCHC: 31.9 g/dL (ref 30.0–36.0)
MCV: 97.6 fL (ref 80.0–100.0)
Monocytes Absolute: 0.5 10*3/uL (ref 0.1–1.0)
Monocytes Relative: 8 %
Neutro Abs: 4.2 10*3/uL (ref 1.7–7.7)
Neutrophils Relative %: 65 %
Platelets: 228 10*3/uL (ref 150–400)
RBC: 3.28 MIL/uL — ABNORMAL LOW (ref 3.87–5.11)
RDW: 14.4 % (ref 11.5–15.5)
WBC: 6.5 10*3/uL (ref 4.0–10.5)
nRBC: 0 % (ref 0.0–0.2)

## 2022-06-16 LAB — D-DIMER, QUANTITATIVE: D-Dimer, Quant: 3.5 ug/mL-FEU — ABNORMAL HIGH (ref 0.00–0.50)

## 2022-06-16 LAB — SARS CORONAVIRUS 2 BY RT PCR: SARS Coronavirus 2 by RT PCR: NEGATIVE

## 2022-06-16 LAB — BRAIN NATRIURETIC PEPTIDE: B Natriuretic Peptide: 2355.7 pg/mL — ABNORMAL HIGH (ref 0.0–100.0)

## 2022-06-16 MED ORDER — DILTIAZEM LOAD VIA INFUSION
10.0000 mg | Freq: Once | INTRAVENOUS | Status: AC
  Administered 2022-06-16: 10 mg via INTRAVENOUS
  Filled 2022-06-16: qty 10

## 2022-06-16 MED ORDER — DILTIAZEM HCL-DEXTROSE 125-5 MG/125ML-% IV SOLN (PREMIX)
5.0000 mg/h | INTRAVENOUS | Status: DC
  Administered 2022-06-16: 5 mg/h via INTRAVENOUS
  Filled 2022-06-16: qty 125

## 2022-06-16 MED ORDER — IOHEXOL 350 MG/ML SOLN
50.0000 mL | Freq: Once | INTRAVENOUS | Status: AC | PRN
  Administered 2022-06-16: 50 mL via INTRAVENOUS

## 2022-06-16 MED ORDER — FUROSEMIDE 10 MG/ML IJ SOLN
40.0000 mg | Freq: Once | INTRAMUSCULAR | Status: AC
  Administered 2022-06-16: 40 mg via INTRAVENOUS
  Filled 2022-06-16: qty 4

## 2022-06-16 MED ORDER — IPRATROPIUM-ALBUTEROL 0.5-2.5 (3) MG/3ML IN SOLN
3.0000 mL | Freq: Once | RESPIRATORY_TRACT | Status: AC
  Administered 2022-06-16: 3 mL via RESPIRATORY_TRACT
  Filled 2022-06-16: qty 3

## 2022-06-16 NOTE — ED Notes (Signed)
ED Provider at bedside. Notified of pt's vitals. Pt sts normal pulse ox 90%. Pt does not appear labored at this time.

## 2022-06-16 NOTE — ED Triage Notes (Signed)
Pt c/o SOB increasingly over the last few days. Pt is on 4L [place] chronically for COPD but sts she has had to bring the O2 up. Pt is 79% on 6L [place] by this RN.

## 2022-06-16 NOTE — ED Provider Notes (Signed)
[facility] Provider Note    Event Date/Time   First MD Initiated Contact with Patient 06/16/22 2025     (approximate)   History   Shortness of Breath   HPI  Danielle [name] is a 66 y.o. female here with shortness of breath.  History limited due to significant dyspnea.  The patient has history of CHF, chronic hypoxia.  She states that she has been increasingly short of breath for the last several days but this is acutely worsened throughout the day today.  She also has had associated pleuritic chest pain that is worse with inspiration.  She feels dyspneic even at rest.  She feels lightheaded.  She was unable to catch her breath even after increasing her home O2 today, so she called EMS for further evaluation.     Physical Exam   Triage Vital Signs: ED Triage Vitals [06/16/22 2010]  Enc Vitals Group     BP 138/88     Pulse Rate 89     Resp (!) 24     Temp 98.1 F (36.7 C)     Temp src      SpO2 (!) 75 %     Weight      Height      Head Circumference      Peak Flow      Pain Score      Pain Loc      Pain Edu?      Excl. in [place]?     Most recent vital signs: Vitals:   06/17/22 0100 06/17/22 0115  BP: 126/77   Pulse: 92 94  Resp: 20   Temp:    SpO2: 100% 100%     General: Awake, mild respiratory distress. CV:  Good peripheral perfusion.  Tachycardic. Resp:  Increased work of breathing with bilateral rales and wheezing. Abd:  No distention.  Other:  1+ edema bilateral lower extremities, slightly asymmetric on the right   ED Results / Procedures / Treatments   Labs (all labs ordered are listed, but only abnormal results are displayed) Labs Reviewed  CBC WITH DIFFERENTIAL/PLATELET - Abnormal; Notable for the following components:      Result Value   RBC 3.28 (*)    Hemoglobin 10.2 (*)    HCT 32.0 (*)    All other components within normal limits  BASIC METABOLIC PANEL - Abnormal; Notable for the following components:   Potassium  3.2 (*)    BUN 25 (*)    Creatinine, Ser 1.50 (*)    Calcium 8.8 (*)    GFR, Estimated 38 (*)    All other components within normal limits  D-DIMER, QUANTITATIVE (NOT AT [facility]) - Abnormal; Notable for the following components:   D-Dimer, Quant 3.50 (*)    All other components within normal limits  BRAIN NATRIURETIC PEPTIDE - Abnormal; Notable for the following components:   B Natriuretic Peptide 2,355.7 (*)    All other components within normal limits  TROPONIN I (HIGH SENSITIVITY) - Abnormal; Notable for the following components:   Troponin I (High Sensitivity) 20 (*)    All other components within normal limits  TROPONIN I (HIGH SENSITIVITY) - Abnormal; Notable for the following components:   Troponin I (High Sensitivity) 22 (*)    All other components within normal limits  SARS CORONAVIRUS 2 BY RT PCR  HEPATIC FUNCTION PANEL  HIV ANTIBODY (ROUTINE TESTING W REFLEX)     EKG Sinus rhythm, ventricular rate 85.  Baseline  wander.  Frequent PVCs.  Prolonged PR interval at 226.  QRS 98, QTc 444   RADIOLOGY Chest x-ray: Cardiomegaly, central pulmonary vessels are more prominent suggesting CHF CT angio chest: No evidence of PE, moderate to large right pleural effusion, bronchial thickening   I also independently reviewed and agree with radiologist interpretations.   PROCEDURES:  Critical Care performed: Yes, see critical care procedure note(s)  .Critical Care  Performed by: Duffy Bruce, MD Authorized by: Duffy Bruce, MD   Critical care provider statement:    Critical care time (minutes):  30   Critical care time was exclusive of:  Separately billable procedures and treating other patients   Critical care was necessary to treat or prevent imminent or life-threatening deterioration of the following conditions:  Cardiac failure, circulatory failure and respiratory failure   Critical care was time spent personally by me on the following activities:  Development of  treatment plan with patient or surrogate, discussions with consultants, evaluation of patient's response to treatment, examination of patient, ordering and review of laboratory studies, ordering and review of radiographic studies, ordering and performing treatments and interventions, pulse oximetry, re-evaluation of patient's condition and review of old [place] ED: Medications  diltiazem (CARDIZEM) 1 mg/mL load via infusion 10 mg (10 mg Intravenous Bolus from Bag 06/16/22 2348)    And  diltiazem (CARDIZEM) 125 mg in dextrose 5% 125 mL (1 mg/mL) infusion (5 mg/hr Intravenous New Bag/Given 06/16/22 2353)  cefTRIAXone (ROCEPHIN) 2 g in sodium chloride 0.9 % 100 mL IVPB (has no administration in time range)  azithromycin (ZITHROMAX) 500 mg in sodium chloride 0.9 % 250 mL IVPB (has no administration in time range)  ipratropium-albuterol (DUONEB) 0.5-2.5 (3) MG/3ML nebulizer solution 3 mL (3 mLs Nebulization Given 06/16/22 2050)  iohexol (OMNIPAQUE) 350 MG/ML injection 50 mL (50 mLs Intravenous Contrast Given 06/16/22 2226)  furosemide (LASIX) injection 40 mg (40 mg Intravenous Given 06/16/22 2345)     IMPRESSION / MDM / [place] / ED COURSE  I reviewed the triage vital signs and the nursing notes.                               The patient is on the cardiac monitor to evaluate for evidence of arrhythmia and/or significant heart rate changes.   Ddx:  Differential includes the following, with pertinent life- or limb-threatening emergencies considered:  CHF exacerbation, PE, PNA, COPD, PTX, ACS, severe anemia  Patient's presentation is most consistent with acute presentation with potential threat to life or bodily function.  MDM:  66 yo F with h/o lung CA, CHF, COPD, pAFib, here with acute on chronic SOB. Pt severely hypoxic on arrival requiring NRB, though lungs overall with acceptable aeration. CXR concerning for possible effusion/CHF. Pt main complaint is  pleuritic CP and D-Dimer positive, so CT angio obtained which shows no PE but does show significant pleural effusion and atelectasis, mucus plugging. Labs notable for significantly elevated BNP, minimal trop elevation likely 2/2 demand, baseline CKD (slightly improved), no leukocytosis. COVID negative.  Pt initially placed on HFNC but switched to BIPAP based on labs/imaging, with improvement. While in ED, pt also entered AFib RVR - placed on dilt with improvement in HR and overall sx. Admit to stepdown.    MEDICATIONS GIVEN IN ED: Medications  diltiazem (CARDIZEM) 1 mg/mL load via infusion 10 mg (10 mg Intravenous Bolus from Bag 06/16/22 2348)  And  diltiazem (CARDIZEM) 125 mg in dextrose 5% 125 mL (1 mg/mL) infusion (5 mg/hr Intravenous New Bag/Given 06/16/22 2353)  cefTRIAXone (ROCEPHIN) 2 g in sodium chloride 0.9 % 100 mL IVPB (has no administration in time range)  azithromycin (ZITHROMAX) 500 mg in sodium chloride 0.9 % 250 mL IVPB (has no administration in time range)  ipratropium-albuterol (DUONEB) 0.5-2.5 (3) MG/3ML nebulizer solution 3 mL (3 mLs Nebulization Given 06/16/22 2050)  iohexol (OMNIPAQUE) 350 MG/ML injection 50 mL (50 mLs Intravenous Contrast Given 06/16/22 2226)  furosemide (LASIX) injection 40 mg (40 mg Intravenous Given 06/16/22 2345)     Consults: Hospitalist   EMR reviewed  Prior echo, dc summaries     FINAL CLINICAL IMPRESSION(S) / ED DIAGNOSES   Final diagnoses:  Acute on chronic congestive heart failure, unspecified heart failure type (HCC)  Acute on chronic respiratory failure with hypoxia (HCC)  Pleural effusion     Rx / DC Orders   ED Discharge Orders     None        Note:  This document was prepared using Dragon voice recognition software and may include unintentional dictation errors.   Duffy Bruce, MD 06/17/22 [phone]

## 2022-06-17 DIAGNOSIS — E43 Unspecified severe protein-calorie malnutrition: Secondary | ICD-10-CM | POA: Diagnosis present

## 2022-06-17 DIAGNOSIS — J9601 Acute respiratory failure with hypoxia: Secondary | ICD-10-CM | POA: Diagnosis not present

## 2022-06-17 DIAGNOSIS — I2721 Secondary pulmonary arterial hypertension: Secondary | ICD-10-CM | POA: Diagnosis present

## 2022-06-17 DIAGNOSIS — I482 Chronic atrial fibrillation, unspecified: Secondary | ICD-10-CM | POA: Diagnosis present

## 2022-06-17 DIAGNOSIS — Z681 Body mass index (BMI) 19 or less, adult: Secondary | ICD-10-CM | POA: Diagnosis not present

## 2022-06-17 DIAGNOSIS — Y95 Nosocomial condition: Secondary | ICD-10-CM | POA: Diagnosis present

## 2022-06-17 DIAGNOSIS — I5043 Acute on chronic combined systolic (congestive) and diastolic (congestive) heart failure: Secondary | ICD-10-CM | POA: Diagnosis present

## 2022-06-17 DIAGNOSIS — Z79899 Other long term (current) drug therapy: Secondary | ICD-10-CM | POA: Diagnosis not present

## 2022-06-17 DIAGNOSIS — Z7901 Long term (current) use of anticoagulants: Secondary | ICD-10-CM | POA: Diagnosis not present

## 2022-06-17 DIAGNOSIS — F32A Depression, unspecified: Secondary | ICD-10-CM | POA: Diagnosis present

## 2022-06-17 DIAGNOSIS — I480 Paroxysmal atrial fibrillation: Secondary | ICD-10-CM | POA: Diagnosis present

## 2022-06-17 DIAGNOSIS — Z853 Personal history of malignant neoplasm of breast: Secondary | ICD-10-CM | POA: Diagnosis not present

## 2022-06-17 DIAGNOSIS — I130 Hypertensive heart and chronic kidney disease with heart failure and stage 1 through stage 4 chronic kidney disease, or unspecified chronic kidney disease: Secondary | ICD-10-CM | POA: Diagnosis present

## 2022-06-17 DIAGNOSIS — Z923 Personal history of irradiation: Secondary | ICD-10-CM | POA: Diagnosis not present

## 2022-06-17 DIAGNOSIS — Z20822 Contact with and (suspected) exposure to covid-19: Secondary | ICD-10-CM | POA: Diagnosis present

## 2022-06-17 DIAGNOSIS — E785 Hyperlipidemia, unspecified: Secondary | ICD-10-CM | POA: Diagnosis present

## 2022-06-17 DIAGNOSIS — K219 Gastro-esophageal reflux disease without esophagitis: Secondary | ICD-10-CM | POA: Diagnosis present

## 2022-06-17 DIAGNOSIS — D649 Anemia, unspecified: Secondary | ICD-10-CM | POA: Diagnosis present

## 2022-06-17 DIAGNOSIS — Z9221 Personal history of antineoplastic chemotherapy: Secondary | ICD-10-CM | POA: Diagnosis not present

## 2022-06-17 DIAGNOSIS — R0602 Shortness of breath: Secondary | ICD-10-CM | POA: Diagnosis present

## 2022-06-17 DIAGNOSIS — J9621 Acute and chronic respiratory failure with hypoxia: Secondary | ICD-10-CM | POA: Diagnosis present

## 2022-06-17 DIAGNOSIS — Z9981 Dependence on supplemental oxygen: Secondary | ICD-10-CM | POA: Diagnosis not present

## 2022-06-17 DIAGNOSIS — J9600 Acute respiratory failure, unspecified whether with hypoxia or hypercapnia: Secondary | ICD-10-CM | POA: Diagnosis present

## 2022-06-17 DIAGNOSIS — Z8616 Personal history of COVID-19: Secondary | ICD-10-CM | POA: Diagnosis not present

## 2022-06-17 DIAGNOSIS — J440 Chronic obstructive pulmonary disease with acute lower respiratory infection: Secondary | ICD-10-CM | POA: Diagnosis present

## 2022-06-17 DIAGNOSIS — N1832 Chronic kidney disease, stage 3b: Secondary | ICD-10-CM | POA: Diagnosis present

## 2022-06-17 DIAGNOSIS — J13 Pneumonia due to Streptococcus pneumoniae: Secondary | ICD-10-CM | POA: Diagnosis present

## 2022-06-17 LAB — CBC WITH DIFFERENTIAL/PLATELET
Abs Immature Granulocytes: 0.02 10*3/uL (ref 0.00–0.07)
Basophils Absolute: 0 10*3/uL (ref 0.0–0.1)
Basophils Relative: 0 %
Eosinophils Absolute: 0 10*3/uL (ref 0.0–0.5)
Eosinophils Relative: 0 %
HCT: 26.4 % — ABNORMAL LOW (ref 36.0–46.0)
Hemoglobin: 8.4 g/dL — ABNORMAL LOW (ref 12.0–15.0)
Immature Granulocytes: 1 %
Lymphocytes Relative: 17 %
Lymphs Abs: 0.5 10*3/uL — ABNORMAL LOW (ref 0.7–4.0)
MCH: 31 pg (ref 26.0–34.0)
MCHC: 31.8 g/dL (ref 30.0–36.0)
MCV: 97.4 fL (ref 80.0–100.0)
Monocytes Absolute: 0 10*3/uL — ABNORMAL LOW (ref 0.1–1.0)
Monocytes Relative: 1 %
Neutro Abs: 2.5 10*3/uL (ref 1.7–7.7)
Neutrophils Relative %: 81 %
Platelets: 244 10*3/uL (ref 150–400)
RBC: 2.71 MIL/uL — ABNORMAL LOW (ref 3.87–5.11)
RDW: 14.5 % (ref 11.5–15.5)
Smear Review: NORMAL
WBC: 3.1 10*3/uL — ABNORMAL LOW (ref 4.0–10.5)
nRBC: 0 % (ref 0.0–0.2)

## 2022-06-17 LAB — COMPREHENSIVE METABOLIC PANEL
ALT: 17 U/L (ref 0–44)
AST: 23 U/L (ref 15–41)
Albumin: 3.4 g/dL — ABNORMAL LOW (ref 3.5–5.0)
Alkaline Phosphatase: 52 U/L (ref 38–126)
Anion gap: 17 — ABNORMAL HIGH (ref 5–15)
BUN: 30 mg/dL — ABNORMAL HIGH (ref 8–23)
CO2: 22 mmol/L (ref 22–32)
Calcium: 8.5 mg/dL — ABNORMAL LOW (ref 8.9–10.3)
Chloride: 100 mmol/L (ref 98–111)
Creatinine, Ser: 1.62 mg/dL — ABNORMAL HIGH (ref 0.44–1.00)
GFR, Estimated: 34 mL/min — ABNORMAL LOW (ref >60)
Glucose, Bld: 109 mg/dL — ABNORMAL HIGH (ref 70–99)
Potassium: 3.7 mmol/L (ref 3.5–5.1)
Sodium: 139 mmol/L (ref 135–145)
Total Bilirubin: 1.1 mg/dL (ref 0.3–1.2)
Total Protein: 7.4 g/dL (ref 6.5–8.1)

## 2022-06-17 LAB — RESPIRATORY PANEL BY PCR

## 2022-06-17 LAB — STREP PNEUMONIAE URINARY ANTIGEN: Strep Pneumo Urinary Antigen: POSITIVE — AB

## 2022-06-17 LAB — MRSA NEXT GEN BY PCR, NASAL: MRSA by PCR Next Gen: NOT DETECTED

## 2022-06-17 LAB — TROPONIN I (HIGH SENSITIVITY)
Troponin I (High Sensitivity): 20 ng/L — ABNORMAL HIGH (ref <18)
Troponin I (High Sensitivity): 20 ng/L — ABNORMAL HIGH (ref <18)
Troponin I (High Sensitivity): 22 ng/L — ABNORMAL HIGH (ref <18)

## 2022-06-17 LAB — TSH: TSH: 0.369 u[IU]/mL (ref 0.350–4.500)

## 2022-06-17 LAB — HEMOGLOBIN A1C
Hgb A1c MFr Bld: 4.4 % — ABNORMAL LOW (ref 4.8–5.6)
Mean Plasma Glucose: 79.58 mg/dL

## 2022-06-17 LAB — PROCALCITONIN: Procalcitonin: <0.1 ng/mL

## 2022-06-17 LAB — HIV ANTIBODY (ROUTINE TESTING W REFLEX): HIV Screen 4th Generation wRfx: NONREACTIVE

## 2022-06-17 MED ORDER — AMIODARONE HCL 200 MG PO TABS
200.0000 mg | ORAL_TABLET | Freq: Two times a day (BID) | ORAL | Status: DC
  Administered 2022-06-17 – 2022-06-21 (×9): 200 mg via ORAL
  Filled 2022-06-17 (×9): qty 1

## 2022-06-17 MED ORDER — UMECLIDINIUM BROMIDE 62.5 MCG/ACT IN AEPB
1.0000 | INHALATION_SPRAY | Freq: Every day | RESPIRATORY_TRACT | Status: DC
  Administered 2022-06-18 – 2022-06-21 (×4): 1 via RESPIRATORY_TRACT
  Filled 2022-06-17: qty 7

## 2022-06-17 MED ORDER — POTASSIUM CHLORIDE 20 MEQ PO PACK
40.0000 meq | PACK | Freq: Once | ORAL | Status: AC
  Administered 2022-06-17: 40 meq via ORAL
  Filled 2022-06-17: qty 2

## 2022-06-17 MED ORDER — SUCRALFATE 1 GM/10ML PO SUSP
0.5000 g | Freq: Two times a day (BID) | ORAL | Status: DC
Start: 2022-06-17 — End: 2022-06-21
  Administered 2022-06-17 – 2022-06-21 (×8): 0.5 g via ORAL
  Filled 2022-06-17 (×8): qty 10

## 2022-06-17 MED ORDER — ATORVASTATIN CALCIUM 20 MG PO TABS
40.0000 mg | ORAL_TABLET | Freq: Every day | ORAL | Status: DC
  Administered 2022-06-17 – 2022-06-21 (×5): 40 mg via ORAL
  Filled 2022-06-17 (×5): qty 2

## 2022-06-17 MED ORDER — APIXABAN 2.5 MG PO TABS
2.5000 mg | ORAL_TABLET | Freq: Two times a day (BID) | ORAL | Status: DC
  Administered 2022-06-17 – 2022-06-21 (×8): 2.5 mg via ORAL
  Filled 2022-06-17 (×8): qty 1

## 2022-06-17 MED ORDER — SODIUM CHLORIDE 0.9 % IV SOLN
2.0000 g | INTRAVENOUS | Status: AC
  Administered 2022-06-17 – 2022-06-21 (×5): 2 g via INTRAVENOUS
  Filled 2022-06-17 (×3): qty 20
  Filled 2022-06-17: qty 2
  Filled 2022-06-17: qty 20

## 2022-06-17 MED ORDER — FUROSEMIDE 10 MG/ML IJ SOLN
60.0000 mg | Freq: Every day | INTRAMUSCULAR | Status: DC
  Administered 2022-06-17 – 2022-06-19 (×3): 60 mg via INTRAVENOUS
  Filled 2022-06-17: qty 8
  Filled 2022-06-17 (×2): qty 6

## 2022-06-17 MED ORDER — ACETAMINOPHEN 325 MG PO TABS
650.0000 mg | ORAL_TABLET | Freq: Four times a day (QID) | ORAL | Status: DC | PRN
  Administered 2022-06-17 – 2022-06-21 (×8): 650 mg via ORAL
  Filled 2022-06-17 (×9): qty 2

## 2022-06-17 MED ORDER — METOPROLOL TARTRATE 25 MG PO TABS
25.0000 mg | ORAL_TABLET | Freq: Two times a day (BID) | ORAL | Status: DC
  Administered 2022-06-17 – 2022-06-21 (×9): 25 mg via ORAL
  Filled 2022-06-17 (×9): qty 1

## 2022-06-17 MED ORDER — PANTOPRAZOLE SODIUM 40 MG PO TBEC
40.0000 mg | DELAYED_RELEASE_TABLET | Freq: Two times a day (BID) | ORAL | Status: DC
  Administered 2022-06-17 – 2022-06-21 (×8): 40 mg via ORAL
  Filled 2022-06-17 (×8): qty 1

## 2022-06-17 MED ORDER — MELATONIN 5 MG PO TABS
5.0000 mg | ORAL_TABLET | Freq: Once | ORAL | Status: AC
  Administered 2022-06-17: 5 mg via ORAL
  Filled 2022-06-17: qty 1

## 2022-06-17 MED ORDER — FLUTICASONE FUROATE-VILANTEROL 100-25 MCG/ACT IN AEPB
1.0000 | INHALATION_SPRAY | Freq: Every day | RESPIRATORY_TRACT | Status: DC
  Administered 2022-06-18 – 2022-06-21 (×4): 1 via RESPIRATORY_TRACT
  Filled 2022-06-17: qty 28

## 2022-06-17 MED ORDER — SODIUM CHLORIDE 0.9 % IV SOLN
500.0000 mg | INTRAVENOUS | Status: AC
  Administered 2022-06-17 – 2022-06-21 (×5): 500 mg via INTRAVENOUS
  Filled 2022-06-17 (×2): qty 5
  Filled 2022-06-17: qty 500
  Filled 2022-06-17 (×2): qty 5

## 2022-06-17 NOTE — TOC CM/SW Note (Signed)
Patient is high risk for readmission. CSW attempted to speak to patient via phone due to airborne isolation. Per RN, patient would like to wait until later. TOC will continue to follow.  Oleh Genin, [place]

## 2022-06-17 NOTE — ED Notes (Signed)
Per Dr. Horton Chin blood pressure is 102/69, heart rate is 72. Patient is in sinus rhythm, gave verbal order to discontinue the diltiazem drip at this time

## 2022-06-17 NOTE — Consult Note (Signed)
CARDIOLOGY CONSULT NOTE               Patient ID: RAYYA YAGI MRN: [id number] DOB/AGE: September 10, 1956 66 y.o.  Admit date: 06/16/2022 Referring Physician Dr. Shawna Clamp hospitalist Primary Physician [name], PA Primary Cardiologist Dr. [name] Reason for consultation : Shortness of breath  HPI  patient is a 66 year old female history of lung cancer combined diastolic systolic heart failure COPD pleural effusion chronic hypoxic respiratory failure on 4 L esophageal CA paroxysmal A-fib GERD chronic renal sufficiency stage IV hyperlipidemia hypertension presents to the ER with worsening shortness of breath over the past week or so patient is also had some dizziness and fatigue not feeling well.  Patient was treated with BiPAP for shortness of breath and dyspnea given supplemental oxygen as well as diuretics and advised to be admitted for further evaluation as well as mild heart failure    Review of systems complete and found to be negative unless listed above     Past Medical History:  Diagnosis Date   Acute respiratory failure due to COVID-19 ([place]) 09/27/2019   Anemia    Anxiety    Aortic atherosclerosis ([place])    Aortic valve stenosis 02/10/2018   a.) TTE 02/10/2018: EF 55-60%: mild AS with MPG of 12 mmHg. b.) TTE 04/21/2018: EF 35-40%; mild AS with MPG 8 mmHg. c.) TTE 11/07/2019: EF 50-55%; mild AS with MPG 10 mmHg.   Arthritis    Atrial fibrillation (HCC)    a.) CHA2DS2-VASc = 4 (age, sex, HTN, aortic plaque). b.) Rate/rhythm maintained on oral carvedilol; chronically anticoagulated on dose reduced apixaban. c.)  Attempted deployment of LAA occlusive device on 01/11/2021; parameters failed and procedure aborted.   Breast cancer, left ([place]) 2000   a.) T2N1M0; ER/PR (+) --> Tx'd with total mastectomy, LN resection, XRT, and chemotherapy   Cancer of right lung ([place]) 07/30/2016   a.) adenocarcinoma; ALK, ROS1, PDL1, BRAF, EGFR all negative.   Chronic diastolic CHF  (congestive heart failure) ([place]) 11/26/2021   CKD (chronic kidney disease), stage IV (HCC)    COPD (chronic obstructive pulmonary disease) (HCC)    Dependence on supplemental oxygen    Depression    Diastolic dysfunction 21/19/4174   a.) TTE 02/10/2017: EF 55-60%; G2DD. b.) TTE 04/21/2018: EF 35-40%; mild LA dilation; mod MV regurgitation. c.) TTE 11/07/2019: EF 50-55%; G1DD.   DOE (dyspnea on exertion)    GERD (gastroesophageal reflux disease)    Heart murmur    History of 2019 novel coronavirus disease (COVID-19) 10/14/2019   HLD (hyperlipidemia)    Hypertension    Long term current use of anticoagulant    a.) apixaban   Lymphedema    Personal history of chemotherapy    Personal history of radiation therapy    Respiratory tract infection due to COVID-19 virus 09/27/2019   SBO (small bowel obstruction) ([place]) 11/05/2020   Vitamin D deficiency     Past Surgical History:  Procedure Laterality Date   Breast Biospy Left    [facility]   BREAST SURGERY     CARDIOVERSION N/A 02/23/2022   Procedure: CARDIOVERSION;  Surgeon: Corey Skains, MD;  Location: [facility] ORS;  Service: Cardiovascular;  Laterality: N/A;   COLONOSCOPY N/A 04/30/2018   Procedure: COLONOSCOPY;  Surgeon: Virgel Manifold, MD;  Location: [facility] ENDOSCOPY;  Service: Endoscopy;  Laterality: N/A;   COLONOSCOPY N/A 07/22/2018   Procedure: COLONOSCOPY;  Surgeon: Virgel Manifold, MD;  Location: [facility] ENDOSCOPY;  Service: Endoscopy;  Laterality: N/A;  COLONOSCOPY WITH PROPOFOL N/A 09/21/2021   Procedure: COLONOSCOPY WITH PROPOFOL;  Surgeon: Benjamine Sprague, DO;  Location: [facility] ENDOSCOPY;  Service: General;  Laterality: N/A;   COLONOSCOPY WITH PROPOFOL N/A 03/11/2022   Procedure: COLONOSCOPY WITH PROPOFOL;  Surgeon: Annamaria Helling, DO;  Location: [facility] ENDOSCOPY;  Service: Gastroenterology;  Laterality: N/A;   DILATION AND CURETTAGE OF UTERUS     ELECTROMAGNETIC NAVIGATION BROCHOSCOPY Right 04/11/2016   Procedure:  ELECTROMAGNETIC NAVIGATION BRONCHOSCOPY;  Surgeon: Vilinda Boehringer, MD;  Location: [facility] ORS;  Service: Cardiopulmonary;  Laterality: Right;   ESOPHAGOGASTRODUODENOSCOPY N/A 07/22/2018   Procedure: ESOPHAGOGASTRODUODENOSCOPY (EGD);  Surgeon: Virgel Manifold, MD;  Location[facility] END[facility]COPY;  Service: Endoscopy;  Laterality: N/A;   ESOPHAGOGASTRODUODENOSCOPY (EGD) WITH PROPOFOL N/A 05/07/2018   Procedure: ESOPHAGOGASTRODUODENOSCOPY (EGD) WITH PROPOFOL;  Surgeon: Lucilla Lame, MD;  Location: [facility] ENDOSCOPY;  Service: Endoscopy;  Laterality: N/A;   ESOPHAGOGASTRODUODENOSCOPY (EGD) WITH PROPOFOL N/A 04/24/2019   Procedure: ESOPHAGOGASTRODUODENOSCOPY (EGD) WITH PROPOFOL;  Surgeon: Jonathon Bellows, MD;  Location: [facility] ENDOSCOPY;  Service: Gastroenterology;  Laterality: N/A;   ESOPHAGOGASTRODUODENOSCOPY (EGD) WITH PROPOFOL N/A 01/12/2020   Procedure: ESOPHAGOGASTRODUODENOSCOPY (EGD) WITH PROPOFOL;  Surgeon: Jonathon Bellows, MD;  Location: [facility] ENDOSCOPY;  Service: Gastroenterology;  Laterality: N/A;   ESOPHAGOGASTRODUODENOSCOPY (EGD) WITH PROPOFOL N/A 04/28/2020   Procedure: ESOPHAGOGASTRODUODENOSCOPY (EGD) WITH PROPOFOL;  Surgeon: Jonathon Bellows, MD;  Location: [facility] ENDOSCOPY;  Service: Gastroenterology;  Laterality: N/A;   ESOPHAGOGASTRODUODENOSCOPY (EGD) WITH PROPOFOL N/A 03/11/2022   Procedure: ESOPHAGOGASTRODUODENOSCOPY (EGD) WITH PROPOFOL;  Surgeon: Annamaria Helling, DO;  Location: [facility] ENDOSCOPY;  Service: Gastroenterology;  Laterality: N/A;   EUS N/A 05/07/2019   Procedure: FULL UPPER ENDOSCOPIC ULTRASOUND (EUS) RADIAL;  Surgeon: Jola Schmidt, MD;  Location: [facility] ENDOSCOPY;  Service: Endoscopy;  Laterality: N/A;   ILEOSCOPY N/A 07/22/2018   Procedure: ILEOSCOPY THROUGH STOMA;  Surgeon: Virgel Manifold, MD;  Location: [facility] ENDOSCOPY;  Service: Endoscopy;  Laterality: N/A;   ILEOSTOMY     ILEOSTOMY N/A 09/08/2018   Procedure: ILEOSTOMY REVISION POSSIBLE CREATION;  Surgeon: Herbert Pun, MD;   Location: [facility] ORS;  Service: General;  Laterality: N/A;   ILEOSTOMY CLOSURE N/A 08/15/2018   Procedure: DILATION OF ILEOSTOMY STRICTURE;  Surgeon: Herbert Pun, MD;  Location: [facility] ORS;  Service: General;  Laterality: N/A;   LAPAROTOMY Right 05/04/2018   Procedure: EXPLORATORY LAPAROTOMY right colectomy right and left ostomy;  Surgeon: Herbert Pun, MD;  Location: [facility] ORS;  Service: General;  Laterality: Right;   LEFT ATRIAL APPENDAGE OCCLUSION N/A 01/11/2021   Procedure: LEFT ATRIAL APPENDAGE OCCLUSION ([place]); ABORTED PROCEDURE WITHOUT DEVICE BEING IMPLANTED; Location: [facility]; Surgeon: Mylinda Latina, MD   LUNG BIOPSY     MASTECTOMY Left    2000, [place]   ROTATOR CUFF REPAIR Right    [place]   XI ROBOTIC ASSISTED COLOSTOMY TAKEDOWN N/A 10/23/2021   Procedure: XI ROBOTIC ASSISTED ILEOSTOMY TAKEDOWN;  Surgeon: Herbert Pun, MD;  Location: [facility] ORS;  Service: General;  Laterality: N/A;  180 minutes for the surgery part please    (Not in a hospital admission)  Social History   Socioeconomic History   Marital status: Divorced    Spouse name: Not on file   Number of children: 3   Years of education: Not on file   Highest education level: Not on file  Occupational History   Occupation: Retired   Tobacco Use   Smoking status: Former    Packs/day: 0.50    Years: 20.00    Total pack years: 10.00    Types: Cigarettes  Quit date: 07/02/2012    Years since quitting: 9.9   Smokeless tobacco: Current    Types: Snuff  Vaping Use   Vaping Use: Never used  Substance and Sexual Activity   Alcohol use: Yes    Alcohol/week: 3.0 standard drinks of alcohol    Types: 3 Cans of beer per week    Comment: Occasionally beer   Drug use: No   Sexual activity: Not Currently  Other Topics Concern   Not on file  Social History Narrative   Not on file   Social Determinants of Health   Financial Resource Strain: High Risk (07/05/2018)   Overall Financial Resource  Strain (CARDIA)    Difficulty of Paying Living Expenses: Hard  Food Insecurity: Food Insecurity Present (07/05/2018)   Hunger Vital Sign    Worried About Running Out of Food in the Last Year: Often true    Ran Out of Food in the Last Year: Often true  Transportation Needs: No Transportation Needs (02/28/2022)   PRAPARE - Hydrologist (Medical): No    Lack of Transportation (Non-Medical): No  Physical Activity: Insufficiently Active (07/05/2018)   Exercise Vital Sign    Days of Exercise per Week: 2 days    Minutes of Exercise per Session: 30 min  Stress: No Stress Concern Present (07/05/2018)   [place]    Feeling of Stress : Not at all  Social Connections: Somewhat Isolated (07/05/2018)   Social Connection and Isolation Panel [NHANES]    Frequency of Communication with Friends and Family: More than three times a week    Frequency of Social Gatherings with Friends and Family: More than three times a week    Attends Religious Services: 1 to 4 times per year    Active Member of [employer] or Organizations: No    Attends Archivist Meetings: Never    Marital Status: Divorced  Human resources officer Violence: Not At Risk (07/05/2018)   Humiliation, Afraid, Rape, and Kick questionnaire    Fear of Current or Ex-Partner: No    Emotionally Abused: No    Physically Abused: No    Sexually Abused: No    Family History  Problem Relation Age of Onset   Breast cancer Mother 47   Cancer Mother        Breast    Cirrhosis Father    Breast cancer Paternal Aunt 48   Cancer Maternal Aunt        Breast       Review of systems complete and found to be negative unless listed above      PHYSICAL EXAM  General: Well developed, well nourished, in no acute distress acute dyspnea HEENT:  Normocephalic and atramatic Neck:  No JVD.  Lungs: Diminished breath sounds bilaterally to auscultation and  percussion. Heart: HRRR . Normal S1 and S2 without gallops or murmurs.  Abdomen: Bowel sounds are positive, abdomen soft and non-tender  Msk:  Back normal, normal gait. Normal strength and tone for age. Extremities: No clubbing, cyanosis or edema.   Neuro: Alert and oriented X 3. Psych:  Good affect, responds appropriately  Labs:   Lab Results  Component Value Date   WBC 3.1 (L) 06/17/2022   HGB 8.4 (L) 06/17/2022   HCT 26.4 (L) 06/17/2022   MCV 97.4 06/17/2022   PLT 244 06/17/2022    Recent Labs  Lab 06/17/22 1044  NA 139  K 3.7  CL 100  CO2 22  BUN 30*  CREATININE 1.62*  CALCIUM 8.5*  PROT 7.4  BILITOT 1.1  ALKPHOS 52  ALT 17  AST 23  GLUCOSE 109*   Lab Results  Component Value Date   CKTOTAL 207 06/26/2013   CKMB 1.9 06/27/2013   TROPONINI <0.03 12/24/2018    Lab Results  Component Value Date   CHOL 152 01/21/2020   Lab Results  Component Value Date   HDL 87 01/21/2020   Lab Results  Component Value Date   LDLCALC 53 01/21/2020   Lab Results  Component Value Date   TRIG 95 11/06/2021   TRIG 57 11/02/2021   TRIG 60 01/21/2020   Lab Results  Component Value Date   CHOLHDL 1.7 01/21/2020   No results found for: "LDLDIRECT"    Radiology: CT Angio Chest PE W and/or Wo Contrast  Result Date: 06/16/2022 CLINICAL DATA:  Pulmonary embolism (PE) suspected, positive D-dimer Increasing shortness of breath.  History of COPD. EXAM: CT ANGIOGRAPHY CHEST WITH CONTRAST TECHNIQUE: Multidetector CT imaging of the chest was performed using the standard protocol during bolus administration of intravenous contrast. Multiplanar CT image reconstructions and MIPs were obtained to evaluate the vascular anatomy. RADIATION DOSE REDUCTION: This exam was performed according to the departmental dose-optimization program which includes automated exposure control, adjustment of the mA and/or kV according to patient size and/or use of iterative reconstruction technique. CONTRAST:   60mL OMNIPAQUE IOHEXOL 350 MG/ML SOLN COMPARISON:  Radiograph earlier today. Most recent chest CT 02/21/2022, additional chest CTs reviewed. Included lung bases from prior abdominal CTs reviewed. FINDINGS: Cardiovascular: There are no filling defects within the pulmonary arteries to suggest pulmonary embolus. Chronically dilated main pulmonary artery at 3.3 cm. Right left main pulmonary arteries are also dilated. Advanced aortic and branch atherosclerosis. Cannot assess for dissection due to phase of contrast tailored to pulmonary artery assessment. There is contrast refluxing into the hepatic veins and IVC. Chronic cardiomegaly. Coronary artery calcifications. No pericardial effusion. Mediastinum/Nodes: Soft tissue density both hila likely represent lymphoid tissue. No mediastinal adenopathy. The previous prominent subcarinal node is not well-defined on the current exam. Small hiatal hernia again seen. Lungs/Pleura: Moderate to large right pleural effusion, chronic, increased from prior CT as well as most recent abdominal CT 05/08/2022. There is associated compressive atelectasis in the lower lobe. Trace left pleural effusion with atelectasis in the lower lobe. Spiculated nodular opacity in the right upper lobe measures 2.5 x 1.8 cm, series 5, image 55, previously 2.5 x 1.5 cm. Masslike opacity in the anterior right upper lobe extending towards the pleural surface has slightly diminished currently measuring 3.5 x 1.9 cm, series 5, image 65 previously 3.8 x 2.1 cm. Moderate emphysema. Marked bronchial thickening particularly in the lower lobes with areas of mucous plugging and mucoid impaction. Bronchial thickening that has significantly progressed. Clustered nodular opacities in the superior segment of the left lower lobe measuring up to 9 mm, series 5, image 60, new. Chronic left apical scarring which is partially calcified. Upper Abdomen: Contrast refluxes into the hepatic veins and IVC. Atherosclerotic  calcifications of the abdominal aorta, including coarse calcifications at the diaphragmatic hiatus. Dense aortic branch calcifications in the included upper abdomen. No free fluid or acute findings. Musculoskeletal: Chronic L2 superior endplate compression fracture. Left anterior rib fractures are remote. No acute osseous findings. There is minimal edema in the right lower lateral chest wall subcutaneous tissues. Review of the MIP images confirms the above findings. IMPRESSION: 1. No pulmonary embolus. 2. Moderate  to large right pleural effusion, chronic, increased from prior CT as well as most recent abdominal CT 05/08/2022. There is associated compressive atelectasis in the right lower lobe. Trace left pleural effusion with atelectasis in the lower lobes. 3. Marked bronchial thickening particularly in the lower lobes with areas of mucous plugging and mucoid impaction. Progressive bronchial inflammation from prior. 4. Clustered nodular opacities in the superior segment of the left lower lobe measuring up to 9 mm, new from prior CT. This may be infectious, inflammatory, or neoplastic. Recommend follow-up CT in 3 months or PET characterization. 5. Areas of masslike opacity in the right upper lobe are stable or slightly improved from prior exam. This may represent treated disease. Attention at follow-up recommended. 6. Chronic cardiomegaly. Coronary artery calcifications. 7. Chronically dilated main pulmonary artery consistent with pulmonary arterial hypertension. Aortic Atherosclerosis (ICD10-I70.0) and Emphysema (ICD10-J43.9). Electronically Signed   By: Keith Rake M.D.   On: 06/16/2022 23:27   DG Chest Portable 1 View  Result Date: 06/16/2022 CLINICAL DATA:  Shortness of breath EXAM: PORTABLE CHEST 1 VIEW COMPARISON:  Previous studies including the examination of 05/22/2022 FINDINGS: Transverse diameter of heart is increased. Central pulmonary vessels are more prominent. There is prominence of interstitial  markings in the parahilar regions and lower lung fields. There is small right pleural effusion. Left lateral CP angle is clear. There is no pneumothorax. IMPRESSION: Cardiomegaly. Central pulmonary vessels are more prominent suggesting CHF. Small right pleural effusion. Electronically Signed   By: Elmer Picker M.D.   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 Patient has recurrent right pleural effusion. Request for therapeutic right thoracentesis. EXAM: ULTRASOUND GUIDED THERAPEUTIC RIGHT THORACENTESIS MEDICATIONS: 10 mL 1 % lidocaine COMPLICATIONS: None immediate. PROCEDURE: An ultrasound guided thoracentesis was thoroughly discussed with the patient and questions answered. The benefits, risks, alternatives and complications were also discussed. The patient understands and wishes to proceed with the procedure. Written consent was obtained. Ultrasound was performed to localize and mark an adequate pocket of fluid in the right chest. The area was then prepped and draped in the normal sterile fashion. 1% Lidocaine was used for local anesthesia. Under ultrasound guidance a 6 Fr Safe-T-Centesis catheter was introduced. Thoracentesis was performed. The catheter was removed and a dressing applied. FINDINGS: A total of approximately 900 cc of clear, amber fluid was removed. IMPRESSION: Successful ultrasound guided right thoracentesis yielding 900 cc of pleural fluid. Read by: Narda Rutherford, AGNP-BC Electronically Signed   By: Albin Felling M.D.   On: 05/23/2022 08:49   DG Chest Port 1 View  Result Date: 05/22/2022 CLINICAL DATA:  Right-sided thoracentesis. EXAM: PORTABLE CHEST 1 VIEW COMPARISON:  Earlier same day. FINDINGS: Significant interval improvement patient's right-sided effusion with only small amount of residual  right pleural fluid noted. Patient is post right-sided thoracentesis. No evidence of pneumothorax. Stable density over the right mid to upper lung as well as stable prominence of the right hilum. Left lung is clear. Cardiomediastinal silhouette and remainder of the exam is unchanged. IMPRESSION: 1. Significant interval improvement in patient's right-sided pleural effusion post thoracentesis. No pneumothorax. Minimal residual right pleural fluid. 2. Stable changes over the right mid to upper lung and stable prominence of the right hilum. Electronically Signed   By: Marin Olp M.D.   On: 05/22/2022 17:58   DG Chest Portable  1 View  Result Date: 05/22/2022 CLINICAL DATA:  SOB EXAM: PORTABLE CHEST 1 VIEW COMPARISON:  Chest x-ray 05/08/2022, CT chest 02/21/2022 FINDINGS: The heart and mediastinal contours are unchanged. Atherosclerotic plaque. No focal consolidation. Chronic coarsened markings with no overt pulmonary edema. Interval increase in a small to moderate volume right pleural effusion. No pneumothorax. No acute osseous abnormality. IMPRESSION: 1. Interval increase in a small to moderate right pleural effusion. 2. Aortic Atherosclerosis (ICD10-I70.0) and Emphysema (ICD10-J43.9). Electronically Signed   By: Iven Finn M.D.   On: 05/22/2022 16:25    EKG: Probable sinus rhythm rate of about 70 nonspecific ST-T wave changes ventricular bigeminy  ASSESSMENT AND PLAN:  Shortness of breath COPD Combined systolic diastolic congestive heart failure Right fusion chronic Chronic hypoxic respiratory failure on 4 L Pleuritic chest pain Paroxysmal atrial fibrillation Chronic renal insufficiency stage IV Palpitations Vertigo History of lung cancer GERD HCAP  . Plan Agree with admit to telemetry supplemental respiratory management supplemental oxygen inhalers Consider steroid therapy for COPD Broad-spectrum antibiotic therapy for pneumonia Atrial fibrillation rapid ventricular sponsor continue  anticoagulation amiodarone metoprolol for rate Right moderate-sized pleural effusion consider thoracentesis for shortness of breath versus diuresis Anemia 10 was 12.7 consider follow-up for bleeding consider GI referral evaluation Recommend continue aggressive pulmonary management and care   Signed: Yolonda Kida MD,  06/17/2022, 4:58 PM

## 2022-06-17 NOTE — H&P (Addendum)
History and Physical    Danielle [name] AST:[id number] DOB: 27-Feb-1956 DOA: 06/16/2022  PCP: [name], [place]  Patient coming from: home  I have personally briefly reviewed patient's old medical records in [place]  Chief Complaint: sob x 1 week progressive  HPI: Danielle [name] is a 66 y.o. female with medical history significant of  Lung CA, CHF combined diastolic/systolic, ,COPD, recurrent right pleural effusionchronic hypoxic respiratory failure on 4L O2 at baseline, esophageal ca, Pafib, GERD,CKDIII, HLD, HTN who presents to ED with sob x 1 week that has been progressive.Patient notes pleuritic chest pain,palpitations as well as dizziness but notes no fever/chills/ n/v/d/dysuria. ED Course:  In ED patient found to have acute on chronic hypoxic respiratory failure due to fluid overload requiring BIPAP support. Of note on arrival to ed patient sat ws 75% on her baseline 4 L , she was increased to 6L but noted no significant improvement was at that time transitioned to bipap.  Vitals: 98.1, bp 138/88, hr 89, rr 24 sat 75 4L, 100% bipapa Labs Wbc 6.4, hgb 10.2,plt 228 Na 137, K 3.2, cr 1.5 ( 2.35) CE 20 BNP 2355 increased D-dimer 3.50 Cxr: Cardiomegaly. Central pulmonary vessels are more prominent suggesting CHF. Small right pleural effusion.   CTPA IMPRESSION: 1. No pulmonary embolus. 2. Moderate to large right pleural effusion, chronic, increased from prior CT as well as most recent abdominal CT 05/08/2022. There is associated compressive atelectasis in the right lower lobe. Trace left pleural effusion with atelectasis in the lower lobes. 3. Marked bronchial thickening particularly in the lower lobes with areas of mucous plugging and mucoid impaction. Progressive bronchial inflammation from prior. 4. Clustered nodular opacities in the superior segment of the left lower lobe measuring up to 9 mm, new from prior CT. This may be infectious, inflammatory, or  neoplastic. Recommend follow-up CT in 3 months or PET characterization. 5. Areas of masslike opacity in the right upper lobe are stable or slightly improved from prior exam. This may represent treated disease. Attention at follow-up recommended. 6. Chronic cardiomegaly. Coronary artery calcifications. 7. Chronically dilated main pulmonary artery consistent with pulmonary arterial hypertension. Review of Systems: As per HPI otherwise 10 point review of systems negative.   Past Medical History:  Diagnosis Date   Acute respiratory failure due to COVID-19 ([place]) 09/27/2019   Anemia    Anxiety    Aortic atherosclerosis ([place])    Aortic valve stenosis 02/10/2018   a.) TTE 02/10/2018: EF 55-60%: mild AS with MPG of 12 mmHg. b.) TTE 04/21/2018: EF 35-40%; mild AS with MPG 8 mmHg. c.) TTE 11/07/2019: EF 50-55%; mild AS with MPG 10 mmHg.   Arthritis    Atrial fibrillation (HCC)    a.) CHA2DS2-VASc = 4 (age, sex, HTN, aortic plaque). b.) Rate/rhythm maintained on oral carvedilol; chronically anticoagulated on dose reduced apixaban. c.)  Attempted deployment of LAA occlusive device on 01/11/2021; parameters failed and procedure aborted.   Breast cancer, left ([place]) 2000   a.) T2N1M0; ER/PR (+) --> Tx'd with total mastectomy, LN resection, XRT, and chemotherapy   Cancer of right lung ([place]) 07/30/2016   a.) adenocarcinoma; ALK, ROS1, PDL1, BRAF, EGFR all negative.   Chronic diastolic CHF (congestive heart failure) ([place]) 11/26/2021   CKD (chronic kidney disease), stage IV (HCC)    COPD (chronic obstructive pulmonary disease) (HCC)    Dependence on supplemental oxygen    Depression    Diastolic dysfunction 98/92/1194   a.) TTE 02/10/2017: EF 55-60%; G2DD. b.)  TTE 04/21/2018: EF 35-40%; mild LA dilation; mod MV regurgitation. c.) TTE 11/07/2019: EF 50-55%; G1DD.   DOE (dyspnea on exertion)    GERD (gastroesophageal reflux disease)    Heart murmur    History of 2019 novel coronavirus disease (COVID-19)  10/14/2019   HLD (hyperlipidemia)    Hypertension    Long term current use of anticoagulant    a.) apixaban   Lymphedema    Personal history of chemotherapy    Personal history of radiation therapy    Respiratory tract infection due to COVID-19 virus 09/27/2019   SBO (small bowel obstruction) (HCC) 11/05/2020   Vitamin D deficiency     Past Surgical History:  Procedure Laterality Date   Breast Biospy Left    [facility]   BREAST SURGERY     CARDIOVERSION N/A 02/23/2022   Procedure: CARDIOVERSION;  Surgeon: Lamar Blinks, MD;  Location: [facility] ORS;  Service: Cardiovascular;  Laterality: N/A;   COLONOSCOPY N/A 04/30/2018   Procedure: COLONOSCOPY;  Surgeon: Pasty Spillers, MD;  Location: [facility] ENDOSCOPY;  Service: Endoscopy;  Laterality: N/A;   COLONOSCOPY N/A 07/22/2018   Procedure: COLONOSCOPY;  Surgeon: Pasty Spillers, MD;  Location: [facility] ENDOSCOPY;  Service: Endoscopy;  Laterality: N/A;   COLONOSCOPY WITH PROPOFOL N/A 09/21/2021   Procedure: COLONOSCOPY WITH PROPOFOL;  Surgeon: Sung Amabile, DO;  Location: [facility] ENDOSCOPY;  Service: General;  Laterality: N/A;   COLONOSCOPY WITH PROPOFOL N/A 03/11/2022   Procedure: COLONOSCOPY WITH PROPOFOL;  Surgeon: Jaynie Collins, DO;  Location: [facility] ENDOSCOPY;  Service: Gastroenterology;  Laterality: N/A;   DILATION AND CURETTAGE OF UTERUS     ELECTROMAGNETIC NAVIGATION BROCHOSCOPY Right 04/11/2016   Procedure: ELECTROMAGNETIC NAVIGATION BRONCHOSCOPY;  Surgeon: Stephanie Acre, MD;  Location: [facility] ORS;  Service: Cardiopulmonary;  Laterality: Right;   ESOPHAGOGASTRODUODENOSCOPY N/A 07/22/2018   Procedure: ESOPHAGOGASTRODUODENOSCOPY (EGD);  Surgeon: Pasty Spillers, MD;  Location: [facility] ENDOSCOPY;  Service: Endoscopy;  Laterality: N/A;   ESOPHAGOGASTRODUODENOSCOPY (EGD) WITH PROPOFOL N/A 05/07/2018   Procedure: ESOPHAGOGASTRODUODENOSCOPY (EGD) WITH PROPOFOL;  Surgeon: Midge Minium, MD;  Location: [facility] ENDOSCOPY;  Service: Endoscopy;   Laterality: N/A;   ESOPHAGOGASTRODUODENOSCOPY (EGD) WITH PROPOFOL N/A 04/24/2019   Procedure: ESOPHAGOGASTRODUODENOSCOPY (EGD) WITH PROPOFOL;  Surgeon: Wyline Mood, MD;  Location: [facility] ENDOSCOPY;  Service: Gastroenterology;  Laterality: N/A;   ESOPHAGOGASTRODUODENOSCOPY (EGD) WITH PROPOFOL N/A 01/12/2020   Procedure: ESOPHAGOGASTRODUODENOSCOPY (EGD) WITH PROPOFOL;  Surgeon: Wyline Mood, MD;  Location: [facility] ENDOSCOPY;  Service: Gastroenterology;  Laterality: N/A;   ESOPHAGOGASTRODUODENOSCOPY (EGD) WITH PROPOFOL N/A 04/28/2020   Procedure: ESOPHAGOGASTRODUODENOSCOPY (EGD) WITH PROPOFOL;  Surgeon: Wyline Mood, MD;  Location: [facility] ENDOSCOPY;  Service: Gastroenterology;  Laterality: N/A;   ESOPHAGOGASTRODUODENOSCOPY (EGD) WITH PROPOFOL N/A 03/11/2022   Procedure: ESOPHAGOGASTRODUODENOSCOPY (EGD) WITH PROPOFOL;  Surgeon: Jaynie Collins, DO;  Location: [facility] ENDOSCOPY;  Service: Gastroenterology;  Laterality: N/A;   EUS N/A 05/07/2019   Procedure: FULL UPPER ENDOSCOPIC ULTRASOUND (EUS) RADIAL;  Surgeon: Rayann Heman, MD;  Location: [facility] ENDOSCOPY;  Service: Endoscopy;  Laterality: N/A;   ILEOSCOPY N/A 07/22/2018   Procedure: ILEOSCOPY THROUGH STOMA;  Surgeon: Pasty Spillers, MD;  Location: [facility] ENDOSCOPY;  Service: Endoscopy;  Laterality: N/A;   ILEOSTOMY     ILEOSTOMY N/A 09/08/2018   Procedure: ILEOSTOMY REVISION POSSIBLE CREATION;  Surgeon: Carolan Shiver, MD;  Location: [facility] ORS;  Service: General;  Laterality: N/A;   ILEOSTOMY CLOSURE N/A 08/15/2018   Procedure: DILATION OF ILEOSTOMY STRICTURE;  Surgeon: Carolan Shiver, MD;  Location: [facility] ORS;  Service: General;  Laterality: N/A;  LAPAROTOMY Right 05/04/2018   Procedure: EXPLORATORY LAPAROTOMY right colectomy right and left ostomy;  Surgeon: Herbert Pun, MD;  Location: [facility] ORS;  Service: General;  Laterality: Right;   LEFT ATRIAL APPENDAGE OCCLUSION N/A 01/11/2021   Procedure: LEFT ATRIAL APPENDAGE OCCLUSION  ([place]); ABORTED PROCEDURE WITHOUT DEVICE BEING IMPLANTED; Location: [facility]; Surgeon: Mylinda Latina, MD   LUNG BIOPSY     MASTECTOMY Left    2000, [place]   ROTATOR CUFF REPAIR Right    [place] TAKEDOWN N/A 10/23/2021   Procedure: XI ROBOTIC ASSISTED ILEOSTOMY TAKEDOWN;  Surgeon: Herbert Pun, MD;  Location: [facility] ORS;  Service: General;  Laterality: N/A;  180 minutes for the surgery part please     reports that she quit smoking about 9 years ago. Her smoking use included cigarettes. She has a 10.00 pack-year smoking history. Her smokeless tobacco use includes snuff. She reports current alcohol use of about 3.0 standard drinks of alcohol per week. She reports that she does not use drugs.  No Known Allergies  Family History  Problem Relation Age of Onset   Breast cancer Mother 74   Cancer Mother        Breast    Cirrhosis Father    Breast cancer Paternal Aunt 30   Cancer Maternal Aunt        Breast     Prior to Admission medications   Medication Sig Start Date End Date Taking? Authorizing Provider  acetaminophen (TYLENOL) 325 MG tablet Take 2 tablets (650 mg total) by mouth every 6 (six) hours as needed for mild pain (or Fever >/= 101). Patient taking differently: Take 650 mg by mouth every 4 (four) hours as needed for moderate pain. 12/24/18   Gouru, Illene Silver, MD  albuterol (PROVENTIL HFA;VENTOLIN HFA) 108 (90 Base) MCG/ACT inhaler Inhale 2 puffs into the lungs every 6 (six) hours as needed for wheezing or shortness of breath. 04/24/18   Demetrios Loll, MD  amiodarone (PACERONE) 200 MG tablet Take 1 tablet (200 mg total) by mouth 2 (two) times daily. 11/24/21   Herbert Pun, MD  apixaban (ELIQUIS) 2.5 MG TABS tablet Take 1 tablet (2.5 mg total) by mouth 2 (two) times daily. 05/26/22 06/25/22  Wyvonnia Dusky, MD  atorvastatin (LIPITOR) 40 MG tablet Take 1 tablet (40 mg total) by mouth daily. 02/03/22   Ezekiel Slocumb, DO  calcium carbonate  (TUMS - DOSED IN MG ELEMENTAL CALCIUM) 500 MG chewable tablet Chew 0.5 tablets by mouth daily as needed.    [provider]  calcium-vitamin D (OSCAL WITH D) 500-5 MG-MCG tablet Take 1 tablet by mouth daily.    [provider]  citalopram (CELEXA) 20 MG tablet Take 1 tablet (20 mg total) by mouth daily. Patient taking differently: Take 40 mg by mouth daily. 02/04/22   Nicole Kindred A, DO  ferrous sulfate 325 (65 FE) MG tablet Take 325 mg by mouth 2 (two) times daily with a meal.    [provider]  Fluticasone-Umeclidin-Vilant (TRELEGY ELLIPTA) 100-62.5-25 MCG/ACT AEPB Inhale 1 puff into the lungs daily. 03/05/22   [provider]  hydrALAZINE (APRESOLINE) 10 MG tablet Take 1 tablet (10 mg total) by mouth 3 (three) times daily. 05/26/22 06/25/22  Wyvonnia Dusky, MD  isosorbide dinitrate (ISORDIL) 5 MG tablet Take 1 tablet (5 mg total) by mouth 3 (three) times daily. 05/26/22 06/25/22  Wyvonnia Dusky, MD  loperamide (IMODIUM) 2 MG capsule Take 2 mg by mouth daily as  needed for diarrhea or loose stools.    [provider]  metoprolol tartrate (LOPRESSOR) 25 MG tablet Take 1 tablet (25 mg total) by mouth 2 (two) times daily. 02/03/22   Ezekiel Slocumb, DO  Multiple Vitamin (MULTIVITAMIN WITH MINERALS) TABS tablet Take 1 tablet by mouth daily.    [provider]  mupirocin ointment (BACTROBAN) 2 % Place into the nose 2 (two) times daily. Patient not taking: Reported on 05/22/2022 05/12/22   Fritzi Mandes, MD  OXYGEN Inhale 4 L into the lungs daily.    [provider]  oxymetazoline (AFRIN) 0.05 % nasal spray Place 1 spray into both nostrils 2 (two) times daily. Patient not taking: Reported on 05/22/2022    [provider]  pantoprazole (PROTONIX) 40 MG tablet Take 1 tablet (40 mg total) by mouth 2 (two) times daily before a meal. 08/05/20   Pokhrel, L[place]man, MD  sodium bicarbonate 650 MG tablet Take 1,300 mg by mouth daily. Patient  not taking: Reported on 05/22/2022    [provider]  sucralfate (CARAFATE) 1 GM/10ML suspension Take 0.5 g by mouth 2 (two) times daily.    [provider]  Torsemide 40 MG TABS Take 20 mg by mouth daily. Hold this medication until you see your cardiologist 05/26/22   Wyvonnia Dusky, MD  zolpidem (AMBIEN) 5 MG tablet Take 5 mg by mouth at bedtime as needed for sleep. 06/23/21   [provider]    Physical Exam: Vitals:   06/17/22 0013 06/17/22 0030 06/17/22 0100 06/17/22 0115  BP:  114/77 126/77   Pulse: 88 (!) 106 92 94  Resp: $Remo'18 15 20   'JOJam$ Temp:      TempSrc:      SpO2: 97% 100% 100% 100%    Vitals:   06/17/22 0013 06/17/22 0030 06/17/22 0100 06/17/22 0115  BP:  114/77 126/77   Pulse: 88 (!) 106 92 94  Resp: $Remo'18 15 20   'DbInA$ Temp:      TempSrc:      SpO2: 97% 100% 100% 100%   Constitutional: on bipap with mild accessory muscle use Eyes: PERRL, lids and conjunctivae normal ENMT: deferred  Neck: normal, supple, no masses, no thyromegaly Respiratory: clear to auscultation bilaterally, occasional bronchial  BS Mild increase in respiratory effort. Mild accessory muscle use.  Cardiovascular: Regular rate and rhythm, no murmurs / rubs / gallops. No extremity edema. + pedal pulses. No carotid bruits.  Abdomen: no tenderness, no masses palpated. No hepatosplenomegaly. Bowel sounds positive.  Musculoskeletal: no clubbing / cyanosis. No joint deformity upper and lower extremities. Good ROM, no contractures. Normal muscle tone.  Skin: no rashes, lesions, ulcers. No induration Neurologic: CN 2-12 grossly intact. Sensation intact, Strength 5/5 in all 4.  Psychiatric: Normal judgment and insight. Alert and oriented x 3. Normal mood.    Labs on Admission: I have personally reviewed following labs and imaging studies  CBC: Recent Labs  Lab 06/16/22 2024  WBC 6.5  NEUTROABS 4.2  HGB 10.2*  HCT 32.0*  MCV 97.6  PLT 656   Basic Metabolic Panel: Recent Labs   Lab 06/16/22 2024  NA 137  K 3.2*  CL 103  CO2 24  GLUCOSE 84  BUN 25*  CREATININE 1.50*  CALCIUM 8.8*   GFR: CrCl cannot be calculated (Unknown ideal weight.). Liver Function Tests: Recent Labs  Lab 06/16/22 2024  AST 24  ALT 18  ALKPHOS 58  BILITOT 0.7  PROT 7.4  ALBUMIN 3.7  No results for input(s): "LIPASE", "AMYLASE" in the last 168 hours. No results for input(s): "AMMONIA" in the last 168 hours. Coagulation Profile: No results for input(s): "INR", "PROTIME" in the last 168 hours. Cardiac Enzymes: No results for input(s): "CKTOTAL", "CKMB", "CKMBINDEX", "TROPONINI" in the last 168 hours. BNP (last 3 results) No results for input(s): "PROBNP" in the last 8760 hours. HbA1C: No results for input(s): "HGBA1C" in the last 72 hours. CBG: No results for input(s): "GLUCAP" in the last 168 hours. Lipid Profile: No results for input(s): "CHOL", "HDL", "LDLCALC", "TRIG", "CHOLHDL", "LDLDIRECT" in the last 72 hours. Thyroid Function Tests: No results for input(s): "TSH", "T4TOTAL", "FREET4", "T3FREE", "THYROIDAB" in the last 72 hours. Anemia Panel: No results for input(s): "VITAMINB12", "FOLATE", "FERRITIN", "TIBC", "IRON", "RETICCTPCT" in the last 72 hours. Urine analysis:    Component Value Date/Time   COLORURINE YELLOW (A) 05/08/2022 1550   APPEARANCEUR CLEAR (A) 05/08/2022 1550   APPEARANCEUR Clear 12/06/2013 2245   LABSPEC 1.011 05/08/2022 1550   LABSPEC 1.015 12/06/2013 2245   PHURINE 5.0 05/08/2022 1550   GLUCOSEU NEGATIVE 05/08/2022 1550   GLUCOSEU Negative 12/06/2013 2245   HGBUR NEGATIVE 05/08/2022 1550   BILIRUBINUR NEGATIVE 05/08/2022 1550   BILIRUBINUR Negative 12/06/2013 2245   KETONESUR 5 (A) 05/08/2022 1550   PROTEINUR >=300 (A) 05/08/2022 1550   NITRITE NEGATIVE 05/08/2022 1550   LEUKOCYTESUR NEGATIVE 05/08/2022 1550   LEUKOCYTESUR Negative 12/06/2013 2245    Radiological Exams on Admission: CT Angio Chest PE W and/or Wo Contrast  Result  Date: 06/16/2022 CLINICAL DATA:  Pulmonary embolism (PE) suspected, positive D-dimer Increasing shortness of breath.  History of COPD. EXAM: CT ANGIOGRAPHY CHEST WITH CONTRAST TECHNIQUE: Multidetector CT imaging of the chest was performed using the standard protocol during bolus administration of intravenous contrast. Multiplanar CT image reconstructions and MIPs were obtained to evaluate the vascular anatomy. RADIATION DOSE REDUCTION: This exam was performed according to the departmental dose-optimization program which includes automated exposure control, adjustment of the mA and/or kV according to patient size and/or use of iterative reconstruction technique. CONTRAST:  12mL OMNIPAQUE IOHEXOL 350 MG/ML SOLN COMPARISON:  Radiograph earlier today. Most recent chest CT 02/21/2022, additional chest CTs reviewed. Included lung bases from prior abdominal CTs reviewed. FINDINGS: Cardiovascular: There are no filling defects within the pulmonary arteries to suggest pulmonary embolus. Chronically dilated main pulmonary artery at 3.3 cm. Right left main pulmonary arteries are also dilated. Advanced aortic and branch atherosclerosis. Cannot assess for dissection due to phase of contrast tailored to pulmonary artery assessment. There is contrast refluxing into the hepatic veins and IVC. Chronic cardiomegaly. Coronary artery calcifications. No pericardial effusion. Mediastinum/Nodes: Soft tissue density both hila likely represent lymphoid tissue. No mediastinal adenopathy. The previous prominent subcarinal node is not well-defined on the current exam. Small hiatal hernia again seen. Lungs/Pleura: Moderate to large right pleural effusion, chronic, increased from prior CT as well as most recent abdominal CT 05/08/2022. There is associated compressive atelectasis in the lower lobe. Trace left pleural effusion with atelectasis in the lower lobe. Spiculated nodular opacity in the right upper lobe measures 2.5 x 1.8 cm, series 5,  image 55, previously 2.5 x 1.5 cm. Masslike opacity in the anterior right upper lobe extending towards the pleural surface has slightly diminished currently measuring 3.5 x 1.9 cm, series 5, image 65 previously 3.8 x 2.1 cm. Moderate emphysema. Marked bronchial thickening particularly in the lower lobes with areas of mucous plugging and mucoid impaction. Bronchial thickening that has significantly progressed. Clustered nodular  opacities in the superior segment of the left lower lobe measuring up to 9 mm, series 5, image 60, new. Chronic left apical scarring which is partially calcified. Upper Abdomen: Contrast refluxes into the hepatic veins and IVC. Atherosclerotic calcifications of the abdominal aorta, including coarse calcifications at the diaphragmatic hiatus. Dense aortic branch calcifications in the included upper abdomen. No free fluid or acute findings. Musculoskeletal: Chronic L2 superior endplate compression fracture. Left anterior rib fractures are remote. No acute osseous findings. There is minimal edema in the right lower lateral chest wall subcutaneous tissues. Review of the MIP images confirms the above findings. IMPRESSION: 1. No pulmonary embolus. 2. Moderate to large right pleural effusion, chronic, increased from prior CT as well as most recent abdominal CT 05/08/2022. There is associated compressive atelectasis in the right lower lobe. Trace left pleural effusion with atelectasis in the lower lobes. 3. Marked bronchial thickening particularly in the lower lobes with areas of mucous plugging and mucoid impaction. Progressive bronchial inflammation from prior. 4. Clustered nodular opacities in the superior segment of the left lower lobe measuring up to 9 mm, new from prior CT. This may be infectious, inflammatory, or neoplastic. Recommend follow-up CT in 3 months or PET characterization. 5. Areas of masslike opacity in the right upper lobe are stable or slightly improved from prior exam. This may  represent treated disease. Attention at follow-up recommended. 6. Chronic cardiomegaly. Coronary artery calcifications. 7. Chronically dilated main pulmonary artery consistent with pulmonary arterial hypertension. Aortic Atherosclerosis (ICD10-I70.0) and Emphysema (ICD10-J43.9). Electronically Signed   By: Keith Rake M.D.   On: 06/16/2022 23:27   DG Chest Portable 1 View  Result Date: 06/16/2022 CLINICAL DATA:  Shortness of breath EXAM: PORTABLE CHEST 1 VIEW COMPARISON:  Previous studies including the examination of 05/22/2022 FINDINGS: Transverse diameter of heart is increased. Central pulmonary vessels are more prominent. There is prominence of interstitial markings in the parahilar regions and lower lung fields. There is small right pleural effusion. Left lateral CP angle is clear. There is no pneumothorax. IMPRESSION: Cardiomegaly. Central pulmonary vessels are more prominent suggesting CHF. Small right pleural effusion. Electronically Signed   By: Elmer Picker M.D.   On: 06/16/2022 20:59    EKG: Independently reviewed.   Assessment/Plan Acute on Chronic hypoxic respiratory failure  -multifactorial due to element of to fluid overload  in setting of Afib rvr  -as well as probable PNA with noted new opacities and increase bronchial inflammation and mucous plugging.  -treat underlying cause.   HCAP -new Opacities on CT chest unable to r/o infection  -ctx/ azithromycin, de-escalate as able  -pulmonary toilet  -urine ag, sputum, f/u on culture data    Acute on chronic combined CHF mild exacerbation  -currently on bipap in ed , will wean as able  -lasix 60 iv daily , further additional dose prn base on clinical response and renal function  - continue on BB, statin  Afib with RVR -continue on ccb drip  -wean as  able  -resume  eliquis metoprolol & amiodarone   -holdor now due to lower h/h , resume once h/h stable    Moderate to large right pleural effusion -increased from  prior imaging ,possible benefit from thoracentesis -evaluate need for thoracentesis in am s/p trial of diuresis  Anemia  -hgb 10.2was 12.7  -monitor h/h  -check anemia panel  -no overt bleeding noted  -hold AC if h/h continue to trend down   Abn CE -due to demand -continue to cycle  CKDIII -at baseline 1.5   GERD -ppi   Depression -cont ssri once med rec completed     DVT prophylaxis: full dose AC Code Status: full Family Communication: none at bedside Disposition Plan: patient  expected to be admitted greater than 2 midnights Admission status: SDU   Clance Boll MD [facility]  If 7PM-7AM, please contact night-coverage [email or web address] Password [email or web address]  06/17/2022, 1:46 AM

## 2022-06-17 NOTE — ED Notes (Signed)
Pt medicated per MAR and tolerated well, assisted with bedpan and cleaned. New bedding on bed, new purewick placed on pt. Pt positioned in bed for comfort on her side, is able to turn self. Pt denies further needs at this time. Call light is in reach, [facility]. Pt informed to call daughter.

## 2022-06-17 NOTE — ED Notes (Signed)
Pt is resting on stretcher at this time with nasal cannula in place and tolerating well. Pt is sleeping lightly but awakens easily and is oriented. Respirations are even and unlabored, skin AfE/WD. Pt denies needs at this time, requests to be left alone as much as possible to rest. Call light is in reach and pt instructed on how to use it. Bed is locked and in lowest position with 2/2 siderails engaged. WCTM.

## 2022-06-17 NOTE — ED Notes (Signed)
Pt transferred to IP bed and positioned for comfort. No needs expressed at this time. Family at bedside, call light in reach.

## 2022-06-17 NOTE — ED Notes (Signed)
Pt is resting on stretcher at this time with Bipap in place and tolerating well. VSS, respirations are even and unlabored, skin AfE/WD. Call light is in reach. Bed is locked and in lowest position, WCTM.

## 2022-06-17 NOTE — Progress Notes (Signed)
       CROSS COVER NOTE  NAME: Danielle [name] MRN: [id number] DOB : 31-Oct-1955    Date of Service   06/17/2022   HPI/Events of Note   Medication request received for sleep aid.  Interventions   Plan: Melatonin      This document was prepared using Dragon voice recognition software and may include unintentional dictation errors.  Neomia Glass DNP, MHA, FNP-BC Nurse Practitioner [facility] Pager [phone]

## 2022-06-17 NOTE — Care Plan (Signed)
This 66 years old female with PMH significant for lung Ca, CHF combined systolic and diastolic, COPD, recurrent right pleural effusion, chronic hypoxic respiratory failure on 4 L of supplemental oxygen at baseline, Esophageal CA, paroxysmal A-fib, GERD, CKD stage IIIb, hyperlipidemia, hypertension presented in the ED with complaints of worsening shortness of breath for 1 week. Patient also reports having pleuritic chest pain associated with palpitations and dizziness.  In the ED Patient was found to be having acute on chronic hypoxic respiratory failure secondary to fluid overload requiring BiPAP support.  Patient was also given IV diuresis in addition to supplemental oxygen up to 6 L/min.  CTA chest ruled out pulmonary embolism.  Patient started on IV antibiotics for possible healthcare associated pneumonia, started on Cardizem gtt. for A-fib with RVR, resumed on Eliquis.  Continued on IV diuresis.  Cardiology is consulted.  Patient was seen and examined at bedside,  feels comfortable, reports feeling better.

## 2022-06-18 ENCOUNTER — Inpatient Hospital Stay: Payer: Medicare Other

## 2022-06-18 DIAGNOSIS — J9601 Acute respiratory failure with hypoxia: Secondary | ICD-10-CM | POA: Diagnosis not present

## 2022-06-18 LAB — CBC
HCT: UNDETERMINED % (ref 36.0–46.0)
HCT: 26.4 % — ABNORMAL LOW (ref 36.0–46.0)
Hemoglobin: UNDETERMINED g/dL (ref 12.0–15.0)
Hemoglobin: 8.7 g/dL — ABNORMAL LOW (ref 12.0–15.0)
MCH: UNDETERMINED pg (ref 26.0–34.0)
MCH: 31.3 pg (ref 26.0–34.0)
MCHC: UNDETERMINED g/dL (ref 30.0–36.0)
MCHC: 33 g/dL (ref 30.0–36.0)
MCV: UNDETERMINED fL (ref 80.0–100.0)
MCV: 95 fL (ref 80.0–100.0)
Platelets: UNDETERMINED 10*3/uL (ref 150–400)
Platelets: 224 10*3/uL (ref 150–400)
RBC: UNDETERMINED MIL/uL (ref 3.87–5.11)
RBC: 2.78 MIL/uL — ABNORMAL LOW (ref 3.87–5.11)
RDW: UNDETERMINED % (ref 11.5–15.5)
RDW: 14.3 % (ref 11.5–15.5)
WBC: UNDETERMINED 10*3/uL (ref 4.0–10.5)
WBC: 5.9 10*3/uL (ref 4.0–10.5)
nRBC: UNDETERMINED % (ref 0.0–0.2)
nRBC: 0 % (ref 0.0–0.2)

## 2022-06-18 LAB — BASIC METABOLIC PANEL
Anion gap: UNDETERMINED (ref 5–15)
Anion gap: 7 (ref 5–15)
BUN: UNDETERMINED mg/dL (ref 8–23)
BUN: 28 mg/dL — ABNORMAL HIGH (ref 8–23)
CO2: UNDETERMINED mmol/L (ref 22–32)
CO2: 27 mmol/L (ref 22–32)
Calcium: UNDETERMINED mg/dL (ref 8.9–10.3)
Calcium: 8.9 mg/dL (ref 8.9–10.3)
Chloride: UNDETERMINED mmol/L (ref 98–111)
Chloride: 108 mmol/L (ref 98–111)
Creatinine, Ser: UNDETERMINED mg/dL (ref 0.44–1.00)
Creatinine, Ser: 1.61 mg/dL — ABNORMAL HIGH (ref 0.44–1.00)
GFR, Estimated: 35 mL/min — ABNORMAL LOW (ref >60)
Glucose, Bld: UNDETERMINED mg/dL (ref 70–99)
Glucose, Bld: 105 mg/dL — ABNORMAL HIGH (ref 70–99)
Potassium: UNDETERMINED mmol/L (ref 3.5–5.1)
Potassium: 4.1 mmol/L (ref 3.5–5.1)
Sodium: UNDETERMINED mmol/L (ref 135–145)
Sodium: 142 mmol/L (ref 135–145)

## 2022-06-18 LAB — MAGNESIUM
Magnesium: UNDETERMINED mg/dL (ref 1.7–2.4)
Magnesium: 1.5 mg/dL — ABNORMAL LOW (ref 1.7–2.4)

## 2022-06-18 LAB — PHOSPHORUS
Phosphorus: UNDETERMINED mg/dL (ref 2.5–4.6)
Phosphorus: 3 mg/dL (ref 2.5–4.6)

## 2022-06-18 LAB — C DIFFICILE QUICK SCREEN W PCR REFLEX
C Diff antigen: NEGATIVE
C Diff interpretation: NOT DETECTED
C Diff toxin: NEGATIVE

## 2022-06-18 MED ORDER — MELATONIN 5 MG PO TABS
10.0000 mg | ORAL_TABLET | Freq: Once | ORAL | Status: AC
  Administered 2022-06-18: 10 mg via ORAL
  Filled 2022-06-18: qty 2

## 2022-06-18 MED ORDER — SODIUM CHLORIDE 0.9 % IV SOLN: INTRAVENOUS | Status: DC | PRN

## 2022-06-18 MED ORDER — STERILE WATER FOR INJECTION IJ SOLN
INTRAMUSCULAR | Status: AC
  Filled 2022-06-18: qty 10

## 2022-06-18 MED ORDER — MAGNESIUM SULFATE 2 GM/50ML IV SOLN
2.0000 g | Freq: Once | INTRAVENOUS | Status: AC
  Administered 2022-06-18: 2 g via INTRAVENOUS
  Filled 2022-06-18: qty 50

## 2022-06-18 MED ORDER — ORAL CARE MOUTH RINSE
15.0000 mL | OROMUCOSAL | Status: DC | PRN
Start: 2022-06-18 — End: 2022-06-21

## 2022-06-18 MED ORDER — METHYLPREDNISOLONE SODIUM SUCC 125 MG IJ SOLR
120.0000 mg | INTRAMUSCULAR | Status: AC
  Administered 2022-06-18 – 2022-06-19 (×2): 120 mg via INTRAVENOUS
  Filled 2022-06-18 (×2): qty 2

## 2022-06-18 NOTE — Plan of Care (Signed)
  Problem: Education: Goal: Ability to demonstrate management of disease process will improve Outcome: Progressing Goal: Ability to verbalize understanding of medication therapies will improve Outcome: Progressing   Problem: Activity: Goal: Capacity to carry out activities will improve Outcome: Progressing   Problem: Education: Goal: Knowledge of disease or condition will improve Outcome: Progressing Goal: Knowledge of the prescribed therapeutic regimen will improve Outcome: Progressing

## 2022-06-18 NOTE — Progress Notes (Signed)
[facility] Cardiology    SUBJECTIVE: Patient states to be doing relatively well improved shortness of breath no chest pain improved lower extremity edema swelling no cough no fever resting comfortably   Vitals:   06/17/22 2321 06/18/22 0416 06/18/22 0437 06/18/22 0755  BP:  135/84  121/80  Pulse: 71 67  70  Resp:  20  14  Temp:  98.2 F (36.8 C)  98.4 F (36.9 C)  TempSrc:  Oral  Oral  SpO2: 95% 98%  96%  Weight:   50.7 kg   Height:   5\' 5"  (1.651 m)     No intake or output data in the 24 hours ending 06/18/22 0927    PHYSICAL EXAM  General: Well developed, well nourished, in no acute distress HEENT:  Normocephalic and atramatic Neck:  No JVD.  Lungs: Clear bilaterally to auscultation and percussion. Heart: HRRR . Normal S1 and S2 without gallops or murmurs.  Abdomen: Bowel sounds are positive, abdomen soft and non-tender  Msk:  Back normal, normal gait. Normal strength and tone for age. Extremities: No clubbing, cyanosis or edema.   Neuro: Alert and oriented X 3. Psych:  Good affect, responds appropriately   LABS: Basic Metabolic Panel: Recent Labs    06/17/22 1044 06/18/22 0438  NA 139 155*  K 3.7 3.0*  CL 100 115*  CO2 22 21*  GLUCOSE 109* 102*  BUN 30* 23  CREATININE 1.62* 1.24*  CALCIUM 8.5* 6.5*  MG  --  1.0*  PHOS  --  2.2*   Liver Function Tests: Recent Labs    06/16/22 2024 06/17/22 1044  AST 24 23  ALT 18 17  ALKPHOS 58 52  BILITOT 0.7 1.1  PROT 7.4 7.4  ALBUMIN 3.7 3.4*   No results for input(s): "LIPASE", "AMYLASE" in the last 72 hours. CBC: Recent Labs    06/16/22 2024 06/17/22 1044 06/18/22 0438  WBC 6.5 3.1* 4.3  NEUTROABS 4.2 2.5  --   HGB 10.2* 8.4* 6.8*  HCT 32.0* 26.4* 20.6*  MCV 97.6 97.4 94.5  PLT 228 244 153   Cardiac Enzymes: No results for input(s): "CKTOTAL", "CKMB", "CKMBINDEX", "TROPONINI" in the last 72 hours. BNP: Invalid input(s): "POCBNP" D-Dimer: Recent Labs    06/16/22 2039  DDIMER 3.50*   Hemoglobin  A1C: Recent Labs    06/17/22 1044  HGBA1C 4.4*   Fasting Lipid Panel: No results for input(s): "CHOL", "HDL", "LDLCALC", "TRIG", "CHOLHDL", "LDLDIRECT" in the last 72 hours. Thyroid Function Tests: Recent Labs    06/17/22 1044  TSH 0.369   Anemia Panel: No results for input(s): "VITAMINB12", "FOLATE", "FERRITIN", "TIBC", "IRON", "RETICCTPCT" in the last 72 hours.  CT Angio Chest PE W and/or Wo Contrast  Result Date: 06/16/2022 CLINICAL DATA:  Pulmonary embolism (PE) suspected, positive D-dimer Increasing shortness of breath.  History of COPD. EXAM: CT ANGIOGRAPHY CHEST WITH CONTRAST TECHNIQUE: Multidetector CT imaging of the chest was performed using the standard protocol during bolus administration of intravenous contrast. Multiplanar CT image reconstructions and MIPs were obtained to evaluate the vascular anatomy. RADIATION DOSE REDUCTION: This exam was performed according to the departmental dose-optimization program which includes automated exposure control, adjustment of the mA and/or kV according to patient size and/or use of iterative reconstruction technique. CONTRAST:  46mL OMNIPAQUE IOHEXOL 350 MG/ML SOLN COMPARISON:  Radiograph earlier today. Most recent chest CT 02/21/2022, additional chest CTs reviewed. Included lung bases from prior abdominal CTs reviewed. FINDINGS: Cardiovascular: There are no filling defects within the pulmonary arteries to  suggest pulmonary embolus. Chronically dilated main pulmonary artery at 3.3 cm. Right left main pulmonary arteries are also dilated. Advanced aortic and branch atherosclerosis. Cannot assess for dissection due to phase of contrast tailored to pulmonary artery assessment. There is contrast refluxing into the hepatic veins and IVC. Chronic cardiomegaly. Coronary artery calcifications. No pericardial effusion. Mediastinum/Nodes: Soft tissue density both hila likely represent lymphoid tissue. No mediastinal adenopathy. The previous prominent  subcarinal node is not well-defined on the current exam. Small hiatal hernia again seen. Lungs/Pleura: Moderate to large right pleural effusion, chronic, increased from prior CT as well as most recent abdominal CT 05/08/2022. There is associated compressive atelectasis in the lower lobe. Trace left pleural effusion with atelectasis in the lower lobe. Spiculated nodular opacity in the right upper lobe measures 2.5 x 1.8 cm, series 5, image 55, previously 2.5 x 1.5 cm. Masslike opacity in the anterior right upper lobe extending towards the pleural surface has slightly diminished currently measuring 3.5 x 1.9 cm, series 5, image 65 previously 3.8 x 2.1 cm. Moderate emphysema. Marked bronchial thickening particularly in the lower lobes with areas of mucous plugging and mucoid impaction. Bronchial thickening that has significantly progressed. Clustered nodular opacities in the superior segment of the left lower lobe measuring up to 9 mm, series 5, image 60, new. Chronic left apical scarring which is partially calcified. Upper Abdomen: Contrast refluxes into the hepatic veins and IVC. Atherosclerotic calcifications of the abdominal aorta, including coarse calcifications at the diaphragmatic hiatus. Dense aortic branch calcifications in the included upper abdomen. No free fluid or acute findings. Musculoskeletal: Chronic L2 superior endplate compression fracture. Left anterior rib fractures are remote. No acute osseous findings. There is minimal edema in the right lower lateral chest wall subcutaneous tissues. Review of the MIP images confirms the above findings. IMPRESSION: 1. No pulmonary embolus. 2. Moderate to large right pleural effusion, chronic, increased from prior CT as well as most recent abdominal CT 05/08/2022. There is associated compressive atelectasis in the right lower lobe. Trace left pleural effusion with atelectasis in the lower lobes. 3. Marked bronchial thickening particularly in the lower lobes with  areas of mucous plugging and mucoid impaction. Progressive bronchial inflammation from prior. 4. Clustered nodular opacities in the superior segment of the left lower lobe measuring up to 9 mm, new from prior CT. This may be infectious, inflammatory, or neoplastic. Recommend follow-up CT in 3 months or PET characterization. 5. Areas of masslike opacity in the right upper lobe are stable or slightly improved from prior exam. This may represent treated disease. Attention at follow-up recommended. 6. Chronic cardiomegaly. Coronary artery calcifications. 7. Chronically dilated main pulmonary artery consistent with pulmonary arterial hypertension. Aortic Atherosclerosis (ICD10-I70.0) and Emphysema (ICD10-J43.9). Electronically Signed   By: Keith Rake M.D.   On: 06/16/2022 23:27   DG Chest Portable 1 View  Result Date: 06/16/2022 CLINICAL DATA:  Shortness of breath EXAM: PORTABLE CHEST 1 VIEW COMPARISON:  Previous studies including the examination of 05/22/2022 FINDINGS: Transverse diameter of heart is increased. Central pulmonary vessels are more prominent. There is prominence of interstitial markings in the parahilar regions and lower lung fields. There is small right pleural effusion. Left lateral CP angle is clear. There is no pneumothorax. IMPRESSION: Cardiomegaly. Central pulmonary vessels are more prominent suggesting CHF. Small right pleural effusion. Electronically Signed   By: Elmer Picker M.D.   On: 06/16/2022 20:59     Echo   TELEMETRY: Rate around 60 ventricular trigeminy nonspecific ST-T wave changes:  ASSESSMENT AND PLAN:  Principal Problem:   Acute respiratory failure (HCC) Acute on chronic hypoxic respiratory failure Streptococcal pneumonia Acute on chronic congestive heart failure Atrial fibrillation Moderate to large right pleural effusion Borderline troponins Chronic renal insufficiency stage III  GERD  Plan Agree with supplemental oxygen and respiratory therapy  consider pulmonary input Recommend diuretic therapy for heart failure management and control Consider therapeutic thoracentesis for pleural effusions Follow-up renal function avoid nephrotoxic drugs consider nephrology input Agree with BiPAP at night as needed for heart failure shortness of breath and dyspnea Continue antibiotic therapy for streptococcal pneumonia Agree with Solu-Medrol for COPD management continue inhalers consider pulmonary input Follow-up borderline troponins Continue rate control for atrial fibrillation with Cardizem Recommend conservative cardiac input   Yolonda Kida, MD 06/18/2022 9:27 AM

## 2022-06-18 NOTE — Progress Notes (Signed)
       CROSS COVER NOTE  NAME: Danielle [name] MRN: [id number] DOB : 1956-07-23    Date of Service   06/18/2022   HPI/Events of Note   Medication request received for sleep aid.  Interventions   Plan: Melatonin     This document was prepared using Dragon voice recognition software and may include unintentional dictation errors.  Neomia Glass DNP, MHA, FNP-BC Nurse Practitioner [facility] Pager [phone]

## 2022-06-18 NOTE — Progress Notes (Addendum)
PROGRESS NOTE    Danielle [name]  WGN:[id number] DOB: [birth year] DOA: 06/16/2022 PCP: [name], PA   Brief Narrative:  This 66 years old female with PMH significant for lung Cancer, CHF combined systolic and diastolic, COPD, recurrent right pleural effusion, chronic hypoxic respiratory failure on 4 L of supplemental oxygen at baseline, Esophageal CA, paroxysmal A-fib, GERD, CKD stage IIIb, hyperlipidemia, hypertension presented in the ED with complaints of worsening shortness of breath for 1 week. Patient also reports having pleuritic chest pain associated with palpitations and dizziness.  In the ED Patient was found to be having acute on chronic hypoxic respiratory failure secondary to fluid overload requiring BiPAP support.  Patient was also given IV diuresis in addition to supplemental oxygen up to 6 L/min.  CTA chest ruled out pulmonary embolism.  Patient started on IV antibiotics for possible community Acquired pneumonia, started on Cardizem gtt. for A-fib with RVR, resumed on Eliquis.  Continued on IV diuresis. Cardiology is consulted.  Assessment & Plan:   Principal Problem:   Acute respiratory failure (HCC)  Acute on chronic hypoxic respiratory failure: Likely multifactorial. Patient presented with worsening shortness of breath requiring BiPAP on arrival.   Seems fluid overloaded with history of combined CHF, BNP 2355.7 on arrival. CTA chest ruled out pulmonary embolism but shows probable pneumonia with new opacities and mucous plugging.   Moderate to large right pleural effusion. Continue supplemental oxygen, BiPAP at night and as needed. Initiated on empiric antibiotics ( ceftriaxone and Zithromax) De-escalate antibiotics as able to.  Continue pulmonary toilet. Strep pneumo antigen positive. Continue IV Lasix 60 mg every 24 hours. Monitor daily weight, intake output charting. Initiated on Solu-Medrol 40 mg every 8hr  for COPD. IR consulted for possible  thoracocentesis.  Streptococcal pneumonia pneumonia: CT chest ruled out pulmonary embolism but shows probable pneumonia. Continue ceftriaxone and Zithromax. Continue pulmonary toilet Urinary antigen strep pneumo positive   Acute on chronic combined CHF: BNP 2355.7, presented with hypoxia requiring BiPAP. Continue supplemental oxygen and wean as tolerated. Continue BiPAP at night and as needed. Continue Lasix 60 mg IV daily.  Monitor daily weight, intake output charting. Continue metoprolol 25 mg po twice daily   Atrial fibrillation with RVR: Heart rate is now reasonably controlled. Discontinue Cardizem gtt. Continue metoprolol and amiodarone. Continue Eliquis. Cardiology is consulted recommended to continue current management.   Moderate to large right pleural effusion: CT chest shows increased from prior imaging. IR consulted for thoracocentesis. Continue Lasix 60 mg  iv daily   Chronic normocytic normochromic anemia Hb 10.2 > 8.4 Monitor H&H. No overt bleeding noted  Hold anticoagulation.   Elevated troponins: Could be due to demand ischemia in the setting of decompensated CHF. Troponin remains flat.  22> 20> 20 Continue cardioprotective medications.   CKD stage IIIa: Serum creatinine at baseline.   Baseline remains around 1.5- 1.7. Avoid nephrotoxic medications.  GERD: Continue pantoprazole 40 mg daily  Hypomagnesemia: Replaced.  Continue to monitor  DVT prophylaxis:  None Code Status: Full code Family Communication: No family at bed side. Disposition Plan:    Status is: Inpatient Remains inpatient appropriate because: Admitted for acute on chronic hypoxic respiratory failure multifactorial from CHF exacerbation, probable pneumonia, A-fib with RVR and possible COPD exacerbation.  Cardiology is consulted awaiting recommendation.    Anticipated discharge home in few days.   Consultants:  Cardiology  Procedures: CTA chest. Antimicrobials:  Ceftriaxone, and Zithromax  Subjective: Patient was seen and examined at bedside.  Overnight events noted.  She appears tachypnea, RR 17. She denies any chest pain but reports feeling improved.   She remains on home supplemental oxygen.  Objective: Vitals:   06/18/22 0416 06/18/22 0437 06/18/22 0755 06/18/22 1207  BP: 135/84  121/80 122/84  Pulse: 67  70 71  Resp: 20  14 19   Temp: 98.2 F (36.8 C)  98.4 F (36.9 C) 97.9 F (36.6 C)  TempSrc: Oral  Oral   SpO2: 98%  96% 94%  Weight:  50.7 kg    Height:  5\' 5"  (1.651 m)      Intake/Output Summary (Last 24 hours) at 06/18/2022 1417 Last data filed at 06/18/2022 1100 Gross per 24 hour  Intake --  Output 250 ml  Net -250 ml   Filed Weights   06/18/22 0437  Weight: 50.7 kg    Examination:  General exam: Appears comfortable, tachypneic, sick looking, deconditioned Respiratory system: CTA bilaterally, no wheezing, mild basilar crackles, RR 17 Cardiovascular system: S1 & S2 heard, regular rate and rhythm, no murmur.   Gastrointestinal system: Abdomen is soft, nontender, nondistended, BS + Central nervous system: Alert and oriented x3. No focal neurological deficits. Extremities: Edema+, .No cyanosis, no clubbing. Psychiatry: Judgement and insight appear normal. Mood & affect appropriate.     Data Reviewed: I have personally reviewed following labs and imaging studies  CBC: Recent Labs  Lab 06/16/22 2024 06/17/22 1044 06/18/22 0438 06/18/22 0923  WBC 6.5 3.1* SPECIMEN CONTAMINATED, UNABLE TO PERFORM TEST(S). 5.9  NEUTROABS 4.2 2.5  --   --   HGB 10.2* 8.4* SPECIMEN CONTAMINATED, UNABLE TO PERFORM TEST(S). 8.7*  HCT 32.0* 26.4* SPECIMEN CONTAMINATED, UNABLE TO PERFORM TEST(S). 26.4*  MCV 97.6 97.4 SPECIMEN CONTAMINATED, UNABLE TO PERFORM TEST(S). 95.0  PLT 228 244 SPECIMEN CONTAMINATED, UNABLE TO PERFORM TEST(S). 381   Basic Metabolic Panel: Recent Labs  Lab 06/16/22 2024 06/17/22 1044 06/18/22 0438  06/18/22 0923  NA 137 139 SPECIMEN CONTAMINATED, UNABLE TO PERFORM TEST(S). 142  K 3.2* 3.7 SPECIMEN CONTAMINATED, UNABLE TO PERFORM TEST(S). 4.1  CL 103 100 SPECIMEN CONTAMINATED, UNABLE TO PERFORM TEST(S). 108  CO2 24 22 SPECIMEN CONTAMINATED, UNABLE TO PERFORM TEST(S). 27  GLUCOSE 84 109* SPECIMEN CONTAMINATED, UNABLE TO PERFORM TEST(S). 105*  BUN 25* 30* SPECIMEN CONTAMINATED, UNABLE TO PERFORM TEST(S). 28*  CREATININE 1.50* 1.62* SPECIMEN CONTAMINATED, UNABLE TO PERFORM TEST(S). 1.61*  CALCIUM 8.8* 8.5* SPECIMEN CONTAMINATED, UNABLE TO PERFORM TEST(S). 8.9  MG  --   --  SPECIMEN CONTAMINATED, UNABLE TO PERFORM TEST(S). 1.5*  PHOS  --   --  SPECIMEN CONTAMINATED, UNABLE TO PERFORM TEST(S). 3.0   GFR: Estimated Creatinine Clearance: 26.8 mL/min (A) (by C-G formula based on SCr of 1.61 mg/dL (H)). Liver Function Tests: Recent Labs  Lab 06/16/22 2024 06/17/22 1044  AST 24 23  ALT 18 17  ALKPHOS 58 52  BILITOT 0.7 1.1  PROT 7.4 7.4  ALBUMIN 3.7 3.4*   No results for input(s): "LIPASE", "AMYLASE" in the last 168 hours. No results for input(s): "AMMONIA" in the last 168 hours. Coagulation Profile: No results for input(s): "INR", "PROTIME" in the last 168 hours. Cardiac Enzymes: No results for input(s): "CKTOTAL", "CKMB", "CKMBINDEX", "TROPONINI" in the last 168 hours. BNP (last 3 results) No results for input(s): "PROBNP" in the last 8760 hours. HbA1C: Recent Labs    06/17/22 1044  HGBA1C 4.4*   CBG: No results for input(s): "GLUCAP" in the last 168 hours. Lipid Profile: No results for input(s): "CHOL", "HDL", "LDLCALC", "TRIG", "CHOLHDL", "LDLDIRECT" in  the last 72 hours. Thyroid Function Tests: Recent Labs    06/17/22 1044  TSH 0.369   Anemia Panel: No results for input(s): "VITAMINB12", "FOLATE", "FERRITIN", "TIBC", "IRON", "RETICCTPCT" in the last 72 hours. Sepsis Labs: Recent Labs  Lab 06/17/22 1044  PROCALCITON <0.10    Recent Results (from the past 240  hour(s))  SARS Coronavirus 2 by RT PCR (hospital order, performed in [facility] hospital lab) *cepheid single result test* Anterior Nasal Swab     Status: None   Collection Time: 06/16/22  8:41 PM   Specimen: Anterior Nasal Swab  Result Value Ref Range Status   SARS Coronavirus 2 by RT PCR NEGATIVE NEGATIVE Final    Comment: (NOTE) SARS-CoV-2 target nucleic acids are NOT DETECTED.  The SARS-CoV-2 RNA is generally detectable in upper and lower respiratory specimens during the acute phase of infection. The lowest concentration of SARS-CoV-2 viral copies this assay can detect is 250 copies / mL. A negative result does not preclude SARS-CoV-2 infection and should not be used as the sole basis for treatment or other patient management decisions.  A negative result may occur with improper specimen collection / handling, submission of specimen other than nasopharyngeal swab, presence of viral mutation(s) within the areas targeted by this assay, and inadequate number of viral copies (<250 copies / mL). A negative result must be combined with clinical observations, patient history, and epidemiological information.  Fact Sheet for Patients:   https://www.patel.info/  Fact Sheet for Healthcare Providers: https://hall.com/  This test is not yet approved or  cleared by the [place] FDA and has been authorized for detection and/or diagnosis of SARS-CoV-2 by FDA under an Emergency Use Authorization (EUA).  This EUA will remain in effect (meaning this test can be used) for the duration of the COVID-19 declaration under Section 564(b)(1) of the Act, 21 U.S.C. section 360bbb-3(b)(1), unless the authorization is terminated or revoked sooner.  Performed at [facility], [address] [place], [place] [address]   MRSA Next Gen by PCR, Nasal     Status: None   Collection Time: 06/17/22 10:44 AM   Specimen: Nasopharyngeal Swab; Nasal Swab   Result Value Ref Range Status   MRSA by PCR Next Gen NOT DETECTED NOT DETECTED Final    Comment: (NOTE) The GeneXpert MRSA Assay (FDA approved for NASAL specimens only), is one component of a comprehensive MRSA colonization surveillance program. It is not intended to diagnose MRSA infection nor to guide or monitor treatment for MRSA infections. Test performance is not FDA approved in patients less than 20 years old. Performed at [facility], [place], [place] [address]   Respiratory (~20 pathogens) panel by PCR     Status: None   Collection Time: 06/17/22 10:44 AM   Specimen: Nasopharyngeal Swab; Respiratory  Result Value Ref Range Status   Adenovirus NOT DETECTED NOT DETECTED Final   Coronavirus 229E NOT DETECTED NOT DETECTED Final    Comment: (NOTE) The Coronavirus on the Respiratory Panel, DOES NOT test for the novel  Coronavirus (2019 nCoV)    Coronavirus HKU1 NOT DETECTED NOT DETECTED Final   Coronavirus NL63 NOT DETECTED NOT DETECTED Final   Coronavirus OC43 NOT DETECTED NOT DETECTED Final   Metapneumovirus NOT DETECTED NOT DETECTED Final   Rhinovirus / Enterovirus NOT DETECTED NOT DETECTED Final   Influenza A NOT DETECTED NOT DETECTED Final   Influenza B NOT DETECTED NOT DETECTED Final   Parainfluenza Virus 1 NOT DETECTED NOT DETECTED Final  Parainfluenza Virus 2 NOT DETECTED NOT DETECTED Final   Parainfluenza Virus 3 NOT DETECTED NOT DETECTED Final   Parainfluenza Virus 4 NOT DETECTED NOT DETECTED Final   Respiratory Syncytial Virus NOT DETECTED NOT DETECTED Final   Bordetella pertussis NOT DETECTED NOT DETECTED Final   Bordetella Parapertussis NOT DETECTED NOT DETECTED Final   Chlamydophila pneumoniae NOT DETECTED NOT DETECTED Final   Mycoplasma pneumoniae NOT DETECTED NOT DETECTED Final    Comment: Performed at [facility], [place] [address] [place], [place] [address]  C Difficile Quick Screen w PCR reflex     Status: None    Collection Time: 06/18/22 10:00 AM   Specimen: STOOL  Result Value Ref Range Status   C Diff antigen NEGATIVE NEGATIVE Final   C Diff toxin NEGATIVE NEGATIVE Final   C Diff interpretation No C. difficile detected.  Final    Comment: Performed at [facility], [address] [place], [place] [address]    Radiology Studies: [facility] Chest [place] 1 View  Result Date: 06/18/2022 CLINICAL DATA:  Follow-up study.  Pleural effusion. EXAM: PORTABLE CHEST 1 VIEW COMPARISON:  06/16/2022 and older exams. FINDINGS: Moderate right pleural effusion obscures hemidiaphragm and portions of the right heart border. There is additional hazy opacity in the right mid to lower lung consistent with a layering component of the pleural effusion and associated compressive atelectasis. Right upper lung is clear. Left lung is hyperexpanded, but clear. Cardiac silhouette mildly enlarged. IMPRESSION: 1. No significant change from the most recent prior study. 2. Moderate right pleural effusion with associated dependent atelectasis. Electronically Signed   By: Lajean Manes M.D.   On: 06/18/2022 10:31   CT Angio Chest PE W and/or Wo Contrast  Result Date: 06/16/2022 CLINICAL DATA:  Pulmonary embolism (PE) suspected, positive D-dimer Increasing shortness of breath.  History of COPD. EXAM: CT ANGIOGRAPHY CHEST WITH CONTRAST TECHNIQUE: Multidetector CT imaging of the chest was performed using the standard protocol during bolus administration of intravenous contrast. Multiplanar CT image reconstructions and MIPs were obtained to evaluate the vascular anatomy. RADIATION DOSE REDUCTION: This exam was performed according to the departmental dose-optimization program which includes automated exposure control, adjustment of the mA and/or kV according to patient size and/or use of iterative reconstruction technique. CONTRAST:  58mL OMNIPAQUE IOHEXOL 350 MG/ML SOLN COMPARISON:  Radiograph earlier today. Most recent chest CT 02/21/2022,  additional chest CTs reviewed. Included lung bases from prior abdominal CTs reviewed. FINDINGS: Cardiovascular: There are no filling defects within the pulmonary arteries to suggest pulmonary embolus. Chronically dilated main pulmonary artery at 3.3 cm. Right left main pulmonary arteries are also dilated. Advanced aortic and branch atherosclerosis. Cannot assess for dissection due to phase of contrast tailored to pulmonary artery assessment. There is contrast refluxing into the hepatic veins and IVC. Chronic cardiomegaly. Coronary artery calcifications. No pericardial effusion. Mediastinum/Nodes: Soft tissue density both hila likely represent lymphoid tissue. No mediastinal adenopathy. The previous prominent subcarinal node is not well-defined on the current exam. Small hiatal hernia again seen. Lungs/Pleura: Moderate to large right pleural effusion, chronic, increased from prior CT as well as most recent abdominal CT 05/08/2022. There is associated compressive atelectasis in the lower lobe. Trace left pleural effusion with atelectasis in the lower lobe. Spiculated nodular opacity in the right upper lobe measures 2.5 x 1.8 cm, series 5, image 55, previously 2.5 x 1.5 cm. Masslike opacity in the anterior right upper lobe extending towards the pleural surface has slightly diminished currently measuring 3.5 x  1.9 cm, series 5, image 65 previously 3.8 x 2.1 cm. Moderate emphysema. Marked bronchial thickening particularly in the lower lobes with areas of mucous plugging and mucoid impaction. Bronchial thickening that has significantly progressed. Clustered nodular opacities in the superior segment of the left lower lobe measuring up to 9 mm, series 5, image 60, new. Chronic left apical scarring which is partially calcified. Upper Abdomen: Contrast refluxes into the hepatic veins and IVC. Atherosclerotic calcifications of the abdominal aorta, including coarse calcifications at the diaphragmatic hiatus. Dense aortic branch  calcifications in the included upper abdomen. No free fluid or acute findings. Musculoskeletal: Chronic L2 superior endplate compression fracture. Left anterior rib fractures are remote. No acute osseous findings. There is minimal edema in the right lower lateral chest wall subcutaneous tissues. Review of the MIP images confirms the above findings. IMPRESSION: 1. No pulmonary embolus. 2. Moderate to large right pleural effusion, chronic, increased from prior CT as well as most recent abdominal CT 05/08/2022. There is associated compressive atelectasis in the right lower lobe. Trace left pleural effusion with atelectasis in the lower lobes. 3. Marked bronchial thickening particularly in the lower lobes with areas of mucous plugging and mucoid impaction. Progressive bronchial inflammation from prior. 4. Clustered nodular opacities in the superior segment of the left lower lobe measuring up to 9 mm, new from prior CT. This may be infectious, inflammatory, or neoplastic. Recommend follow-up CT in 3 months or PET characterization. 5. Areas of masslike opacity in the right upper lobe are stable or slightly improved from prior exam. This may represent treated disease. Attention at follow-up recommended. 6. Chronic cardiomegaly. Coronary artery calcifications. 7. Chronically dilated main pulmonary artery consistent with pulmonary arterial hypertension. Aortic Atherosclerosis (ICD10-I70.0) and Emphysema (ICD10-J43.9). Electronically Signed   By: Keith Rake M.D.   On: 06/16/2022 23:27   DG Chest Portable 1 View  Result Date: 06/16/2022 CLINICAL DATA:  Shortness of breath EXAM: PORTABLE CHEST 1 VIEW COMPARISON:  Previous studies including the examination of 05/22/2022 FINDINGS: Transverse diameter of heart is increased. Central pulmonary vessels are more prominent. There is prominence of interstitial markings in the parahilar regions and lower lung fields. There is small right pleural effusion. Left lateral CP angle  is clear. There is no pneumothorax. IMPRESSION: Cardiomegaly. Central pulmonary vessels are more prominent suggesting CHF. Small right pleural effusion. Electronically Signed   By: Elmer Picker M.D.   On: 06/16/2022 20:59    Scheduled Meds:  amiodarone  200 mg Oral BID   apixaban  2.5 mg Oral BID   atorvastatin  40 mg Oral Daily   fluticasone furoate-vilanterol  1 puff Inhalation Daily   And   umeclidinium bromide  1 puff Inhalation Daily   furosemide  60 mg Intravenous Daily   methylPREDNISolone (SOLU-MEDROL) injection  120 mg Intravenous Q24 Hr x 2   metoprolol tartrate  25 mg Oral BID   pantoprazole  40 mg Oral BID AC   sucralfate  0.5 g Oral BID   Continuous Infusions:  sodium chloride 10 mL/hr at 06/18/22 0420   azithromycin 500 mg (06/18/22 0526)   cefTRIAXone (ROCEPHIN)  IV 2 g (06/18/22 0421)   diltiazem (CARDIZEM) infusion Stopped (06/17/22 0536)   magnesium sulfate bolus IVPB       LOS: 1 day    Time spent: 50 mins    Abimbola Aki, MD [facility]   If 7PM-7AM, please contact night-coverage

## 2022-06-19 ENCOUNTER — Ambulatory Visit: Payer: Medicare Other | Admitting: Gastroenterology

## 2022-06-19 ENCOUNTER — Inpatient Hospital Stay: Payer: Medicare Other

## 2022-06-19 ENCOUNTER — Encounter: Payer: Self-pay | Admitting: Internal Medicine

## 2022-06-19 ENCOUNTER — Other Ambulatory Visit: Payer: Self-pay

## 2022-06-19 DIAGNOSIS — J9601 Acute respiratory failure with hypoxia: Secondary | ICD-10-CM | POA: Diagnosis not present

## 2022-06-19 LAB — CBC
HCT: 24.6 % — ABNORMAL LOW (ref 36.0–46.0)
Hemoglobin: 8 g/dL — ABNORMAL LOW (ref 12.0–15.0)
MCH: 30.9 pg (ref 26.0–34.0)
MCHC: 32.5 g/dL (ref 30.0–36.0)
MCV: 95 fL (ref 80.0–100.0)
Platelets: 216 10*3/uL (ref 150–400)
RBC: 2.59 MIL/uL — ABNORMAL LOW (ref 3.87–5.11)
RDW: 14.6 % (ref 11.5–15.5)
WBC: 5.6 10*3/uL (ref 4.0–10.5)
nRBC: 0.4 % — ABNORMAL HIGH (ref 0.0–0.2)

## 2022-06-19 LAB — LEGIONELLA PNEUMOPHILA SEROGP 1 UR AG: L. pneumophila Serogp 1 Ur Ag: NEGATIVE

## 2022-06-19 LAB — BASIC METABOLIC PANEL
Anion gap: 10 (ref 5–15)
BUN: 33 mg/dL — ABNORMAL HIGH (ref 8–23)
CO2: 26 mmol/L (ref 22–32)
Calcium: 8.9 mg/dL (ref 8.9–10.3)
Chloride: 106 mmol/L (ref 98–111)
Creatinine, Ser: 1.72 mg/dL — ABNORMAL HIGH (ref 0.44–1.00)
GFR, Estimated: 32 mL/min — ABNORMAL LOW (ref >60)
Glucose, Bld: 131 mg/dL — ABNORMAL HIGH (ref 70–99)
Potassium: 3.8 mmol/L (ref 3.5–5.1)
Sodium: 142 mmol/L (ref 135–145)

## 2022-06-19 LAB — PHOSPHORUS: Phosphorus: 2.5 mg/dL (ref 2.5–4.6)

## 2022-06-19 LAB — BODY FLUID CELL COUNT WITH DIFFERENTIAL
Eos, Fluid: 0 %
Lymphs, Fluid: 22 %
Monocyte-Macrophage-Serous Fluid: 11 %
Neutrophil Count, Fluid: 67 %
Total Nucleated Cell Count, Fluid: 416 cu mm

## 2022-06-19 LAB — PROTEIN, PLEURAL OR PERITONEAL FLUID: Total protein, fluid: <3 g/dL

## 2022-06-19 LAB — GLUCOSE, PLEURAL OR PERITONEAL FLUID: Glucose, Fluid: 159 mg/dL

## 2022-06-19 LAB — BRAIN NATRIURETIC PEPTIDE: B Natriuretic Peptide: 3830.6 pg/mL — ABNORMAL HIGH (ref 0.0–100.0)

## 2022-06-19 LAB — MAGNESIUM: Magnesium: 2.2 mg/dL (ref 1.7–2.4)

## 2022-06-19 MED ORDER — NEPRO/CARBSTEADY PO LIQD
237.0000 mL | Freq: Two times a day (BID) | ORAL | Status: DC
  Administered 2022-06-20: 237 mL via ORAL

## 2022-06-19 MED ORDER — MELATONIN 5 MG PO TABS
10.0000 mg | ORAL_TABLET | Freq: Every evening | ORAL | Status: DC | PRN
Start: 2022-06-19 — End: 2022-06-21
  Administered 2022-06-19 – 2022-06-20 (×2): 10 mg via ORAL
  Filled 2022-06-19 (×2): qty 2

## 2022-06-19 MED ORDER — TORSEMIDE 20 MG PO TABS
20.0000 mg | ORAL_TABLET | Freq: Every day | ORAL | Status: DC
  Administered 2022-06-20 – 2022-06-21 (×2): 20 mg via ORAL
  Filled 2022-06-19 (×2): qty 1

## 2022-06-19 MED ORDER — ADULT MULTIVITAMIN W/MINERALS CH
1.0000 | ORAL_TABLET | Freq: Every day | ORAL | Status: DC
  Administered 2022-06-19 – 2022-06-21 (×3): 1 via ORAL
  Filled 2022-06-19 (×3): qty 1

## 2022-06-19 NOTE — Progress Notes (Signed)
       CROSS COVER NOTE  NAME: PIERRE CUMPTON MRN: [id number] DOB : 05-02-1956    Date of Service   06/19/2022   HPI/Events of Note   Medication request received for sleep aid.  Interventions   Plan: Melatonin     This document was prepared using Dragon voice recognition software and may include unintentional dictation errors.  Neomia Glass DNP, MHA, FNP-BC Nurse Practitioner [facility] Pager [phone]

## 2022-06-19 NOTE — Progress Notes (Signed)
PROGRESS NOTE    Danielle [name]  PPI:[id number] DOB: 1956-01-14 DOA: 06/16/2022 PCP: [name], PA   Brief Narrative:  This 66 years old female with PMH significant for lung Cancer, CHF combined systolic and diastolic, COPD, recurrent right pleural effusion, chronic hypoxic respiratory failure on 4 L of supplemental oxygen at baseline, Esophageal CA, paroxysmal A-fib, GERD, CKD stage IIIb, hyperlipidemia, hypertension presented in the ED with complaints of worsening shortness of breath for 1 week. Patient also reports having pleuritic chest pain associated with palpitations and dizziness.  In the ED Patient was found to be having acute on chronic hypoxic respiratory failure secondary to fluid overload requiring BiPAP support.  Patient was also given IV diuresis in addition to supplemental oxygen up to 6 L/min.  CTA chest ruled out pulmonary embolism.  Patient started on IV antibiotics for possible community Acquired pneumonia, started on Cardizem gtt. for A-fib with RVR, resumed on Eliquis.  Continued on IV diuresis. Cardiology is consulted.  Assessment & Plan:   Principal Problem:   Acute respiratory failure (HCC)  Acute on chronic hypoxic respiratory failure: Likely multifactorial. Patient presented with worsening shortness of breath requiring BiPAP on arrival.   Seems fluid overloaded with history of combined CHF, BNP 2355.7 on arrival. CTA chest ruled out pulmonary embolism but shows probable pneumonia with new opacities and mucous plugging.   Moderate to large right pleural effusion. Continue supplemental oxygen, BiPAP at night and as needed. Initiated on empiric antibiotics ( ceftriaxone and Zithromax) De-escalate antibiotics as able to.  Continue pulmonary toilet. Strep pneumo antigen positive. Initiated IV Lasix 60 mg every 24 hours, changed to torsemide 20 mg today. Monitor daily weight, intake output charting. She was given Solu-Medrol 120 mg once for COPD. S/p IR guided  thoracocentesis and 1.2 L of clear fluid drained. She reports feeling much improved.  Weaned down to 4 L of supplemental oxygen.  Streptococcal pneumonia pneumonia: CT chest ruled out pulmonary embolism but shows probable pneumonia. Continue ceftriaxone and Zithromax. Continue pulmonary toilet Urinary antigen strep pneumo positive.   Acute on chronic systolic CHF: BNP 0630.1, presented with hypoxia requiring BiPAP. Continue supplemental oxygen and wean as tolerated. Continue BiPAP at night and as needed. Continued Lasix 60 mg IV daily, changed to torsemide 20 mg today. Monitor daily weight, intake output charting. Continue metoprolol 25 mg po twice daily. Last echo 4/23: Shows LVEF 35 to 40%.   Paroxysmal Atrial fibrillation with RVR: Heart rate is now reasonably controlled. Now in sinus rhythm Discontinue Cardizem gtt. Continue metoprolol and amiodarone. Continue Eliquis. Cardiology is consulted recommended to continue current management.   Moderate to large right pleural effusion: CT chest shows increased from prior imaging. S/p IR guided thoracocentesis.  1.2 L of clear fluid drained. Continue torsemide 20 mg daily   Chronic normocytic normochromic anemia Hb 10.2 > 8.4>8.7>8.0 Monitor H&H. No overt bleeding noted.    Elevated troponins: Could be due to demand ischemia in the setting of decompensated CHF. Troponin remains flat.  22> 20> 20 Continue cardioprotective medications.   CKD stage IIIa: Serum creatinine slightly up from baseline.  Could be due to Lasix Therapy Baseline remains around 1.5- 1.7.  Monitor serum creatinine. Avoid nephrotoxic medications.  GERD: Continue pantoprazole 40 mg daily  Hypomagnesemia: Replaced.  Continue to monitor  DVT prophylaxis:  SCDs Code Status: Full code Family Communication: No family at bed side. Disposition Plan:    Status is: Inpatient Remains inpatient appropriate because: Admitted for acute on chronic hypoxic  respiratory  failure multifactorial from CHF exacerbation, probable pneumonia, A-fib with RVR and possible COPD exacerbation.  Cardiology is consulted.  S/p thoracocentesis.  1.2 L of clear fluid drained    Anticipated discharge home in few days.   Consultants:  Cardiology  Procedures: CTA chest. Antimicrobials: Ceftriaxone, and Zithromax  Subjective: Patient was seen and examined at bedside.  Overnight events noted.   Patient reports feeling much improved after having thoracocentesis.   She denies any further chest pain, weaned down to 4 L of supplemental oxygen  Objective: Vitals:   06/19/22 0332 06/19/22 0845 06/19/22 0900 06/19/22 1128  BP: 138/81 131/75 124/79 116/72  Pulse: 63 72 68 74  Resp: 16 18 18 18   Temp: 98 F (36.7 C) (!) 97.3 F (36.3 C)  97.7 F (36.5 C)  TempSrc: Oral     SpO2: 100% (!) 86% 98% 100%  Weight:      Height:        Intake/Output Summary (Last 24 hours) at 06/19/2022 1355 Last data filed at 06/19/2022 1135 Gross per 24 hour  Intake 1021.06 ml  Output 500 ml  Net 521.06 ml   Filed Weights   06/18/22 0437  Weight: 50.7 kg    Examination:  General exam: Appears comfortable, sick looking, not in any acute distress. Respiratory system: CTA bilaterally, no wheezing, no crackles, normal respiratory effort. Cardiovascular system: S1 & S2 heard, regular rate and rhythm, no murmur.   Gastrointestinal system: Abdomen is soft, nontender, nondistended, BS + Central nervous system: Alert and oriented x 3. No focal neurological deficits. Extremities: Edema+, .No cyanosis, no clubbing. Psychiatry: Judgement and insight appear normal. Mood & affect appropriate.     Data Reviewed: I have personally reviewed following labs and imaging studies  CBC: Recent Labs  Lab 06/16/22 2024 06/17/22 1044 06/18/22 0438 06/18/22 0923 06/19/22 0526  WBC 6.5 3.1* SPECIMEN CONTAMINATED, UNABLE TO PERFORM TEST(S). 5.9 5.6  NEUTROABS 4.2 2.5  --   --   --   HGB  10.2* 8.4* SPECIMEN CONTAMINATED, UNABLE TO PERFORM TEST(S). 8.7* 8.0*  HCT 32.0* 26.4* SPECIMEN CONTAMINATED, UNABLE TO PERFORM TEST(S). 26.4* 24.6*  MCV 97.6 97.4 SPECIMEN CONTAMINATED, UNABLE TO PERFORM TEST(S). 95.0 95.0  PLT 228 244 SPECIMEN CONTAMINATED, UNABLE TO PERFORM TEST(S). 224 403   Basic Metabolic Panel: Recent Labs  Lab 06/16/22 2024 06/17/22 1044 06/18/22 0438 06/18/22 0923 06/19/22 0526  NA 137 139 SPECIMEN CONTAMINATED, UNABLE TO PERFORM TEST(S). 142 142  K 3.2* 3.7 SPECIMEN CONTAMINATED, UNABLE TO PERFORM TEST(S). 4.1 3.8  CL 103 100 SPECIMEN CONTAMINATED, UNABLE TO PERFORM TEST(S). 108 106  CO2 24 22 SPECIMEN CONTAMINATED, UNABLE TO PERFORM TEST(S). 27 26  GLUCOSE 84 109* SPECIMEN CONTAMINATED, UNABLE TO PERFORM TEST(S). 105* 131*  BUN 25* 30* SPECIMEN CONTAMINATED, UNABLE TO PERFORM TEST(S). 28* 33*  CREATININE 1.50* 1.62* SPECIMEN CONTAMINATED, UNABLE TO PERFORM TEST(S). 1.61* 1.72*  CALCIUM 8.8* 8.5* SPECIMEN CONTAMINATED, UNABLE TO PERFORM TEST(S). 8.9 8.9  MG  --   --  SPECIMEN CONTAMINATED, UNABLE TO PERFORM TEST(S). 1.5* 2.2  PHOS  --   --  SPECIMEN CONTAMINATED, UNABLE TO PERFORM TEST(S). 3.0 2.5   GFR: Estimated Creatinine Clearance: 25.1 mL/min (A) (by C-G formula based on SCr of 1.72 mg/dL (H)). Liver Function Tests: Recent Labs  Lab 06/16/22 2024 06/17/22 1044  AST 24 23  ALT 18 17  ALKPHOS 58 52  BILITOT 0.7 1.1  PROT 7.4 7.4  ALBUMIN 3.7 3.4*   No results for input(s): "LIPASE", "AMYLASE" in the  last 168 hours. No results for input(s): "AMMONIA" in the last 168 hours. Coagulation Profile: No results for input(s): "INR", "PROTIME" in the last 168 hours. Cardiac Enzymes: No results for input(s): "CKTOTAL", "CKMB", "CKMBINDEX", "TROPONINI" in the last 168 hours. BNP (last 3 results) No results for input(s): "PROBNP" in the last 8760 hours. HbA1C: Recent Labs    06/17/22 1044  HGBA1C 4.4*   CBG: No results for input(s): "GLUCAP" in  the last 168 hours. Lipid Profile: No results for input(s): "CHOL", "HDL", "LDLCALC", "TRIG", "CHOLHDL", "LDLDIRECT" in the last 72 hours. Thyroid Function Tests: Recent Labs    06/17/22 1044  TSH 0.369   Anemia Panel: No results for input(s): "VITAMINB12", "FOLATE", "FERRITIN", "TIBC", "IRON", "RETICCTPCT" in the last 72 hours. Sepsis Labs: Recent Labs  Lab 06/17/22 1044  PROCALCITON <0.10    Recent Results (from the past 240 hour(s))  SARS Coronavirus 2 by RT PCR (hospital order, performed in [facility] hospital lab) *cepheid single result test* Anterior Nasal Swab     Status: None   Collection Time: 06/16/22  8:41 PM   Specimen: Anterior Nasal Swab  Result Value Ref Range Status   SARS Coronavirus 2 by RT PCR NEGATIVE NEGATIVE Final    Comment: (NOTE) SARS-CoV-2 target nucleic acids are NOT DETECTED.  The SARS-CoV-2 RNA is generally detectable in upper and lower respiratory specimens during the acute phase of infection. The lowest concentration of SARS-CoV-2 viral copies this assay can detect is 250 copies / mL. A negative result does not preclude SARS-CoV-2 infection and should not be used as the sole basis for treatment or other patient management decisions.  A negative result may occur with improper specimen collection / handling, submission of specimen other than nasopharyngeal swab, presence of viral mutation(s) within the areas targeted by this assay, and inadequate number of viral copies (<250 copies / mL). A negative result must be combined with clinical observations, patient history, and epidemiological information.  Fact Sheet for Patients:   https://www.patel.info/  Fact Sheet for Healthcare Providers: https://hall.com/  This test is not yet approved or  cleared by the [place] FDA and has been authorized for detection and/or diagnosis of SARS-CoV-2 by FDA under an Emergency Use Authorization (EUA).  This EUA  will remain in effect (meaning this test can be used) for the duration of the COVID-19 declaration under Section 564(b)(1) of the Act, 21 U.S.C. section 360bbb-3(b)(1), unless the authorization is terminated or revoked sooner.  Performed at [facility], [address] [place], [place] [address]   MRSA Next Gen by PCR, Nasal     Status: None   Collection Time: 06/17/22 10:44 AM   Specimen: Nasopharyngeal Swab; Nasal Swab  Result Value Ref Range Status   MRSA by PCR Next Gen NOT DETECTED NOT DETECTED Final    Comment: (NOTE) The GeneXpert MRSA Assay (FDA approved for NASAL specimens only), is one component of a comprehensive MRSA colonization surveillance program. It is not intended to diagnose MRSA infection nor to guide or monitor treatment for MRSA infections. Test performance is not FDA approved in patients less than 88 years old. Performed at [facility], [place], [place] [address]   Respiratory (~20 pathogens) panel by PCR     Status: None   Collection Time: 06/17/22 10:44 AM   Specimen: Nasopharyngeal Swab; Respiratory  Result Value Ref Range Status   Adenovirus NOT DETECTED NOT DETECTED Final   Coronavirus 229E NOT DETECTED NOT DETECTED Final  Comment: (NOTE) The Coronavirus on the Respiratory Panel, DOES NOT test for the novel  Coronavirus (2019 nCoV)    Coronavirus HKU1 NOT DETECTED NOT DETECTED Final   Coronavirus NL63 NOT DETECTED NOT DETECTED Final   Coronavirus OC43 NOT DETECTED NOT DETECTED Final   Metapneumovirus NOT DETECTED NOT DETECTED Final   Rhinovirus / Enterovirus NOT DETECTED NOT DETECTED Final   Influenza A NOT DETECTED NOT DETECTED Final   Influenza B NOT DETECTED NOT DETECTED Final   Parainfluenza Virus 1 NOT DETECTED NOT DETECTED Final   Parainfluenza Virus 2 NOT DETECTED NOT DETECTED Final   Parainfluenza Virus 3 NOT DETECTED NOT DETECTED Final   Parainfluenza Virus 4 NOT DETECTED NOT DETECTED Final    Respiratory Syncytial Virus NOT DETECTED NOT DETECTED Final   Bordetella pertussis NOT DETECTED NOT DETECTED Final   Bordetella Parapertussis NOT DETECTED NOT DETECTED Final   Chlamydophila pneumoniae NOT DETECTED NOT DETECTED Final   Mycoplasma pneumoniae NOT DETECTED NOT DETECTED Final    Comment: Performed at [facility], [place] [address] [place], [place] [address]  Culture, blood (routine x 2) Call MD if unable to obtain prior to antibiotics being given     Status: None (Preliminary result)   Collection Time: 06/18/22  4:38 AM   Specimen: BLOOD  Result Value Ref Range Status   Specimen Description BLOOD BLOOD RIGHT FOREARM  Final   Special Requests   Final    BOTTLES DRAWN AEROBIC AND ANAEROBIC Blood Culture adequate volume   Culture   Final    NO GROWTH 1 DAY Performed at [facility], [address], [place], [place] [address]    Report Status PENDING  Incomplete  Culture, blood (routine x 2) Call MD if unable to obtain prior to antibiotics being given     Status: None (Preliminary result)   Collection Time: 06/18/22  4:38 AM   Specimen: BLOOD  Result Value Ref Range Status   Specimen Description BLOOD BLOOD RIGHT HAND  Final   Special Requests   Final    BOTTLES DRAWN AEROBIC AND ANAEROBIC Blood Culture adequate volume   Culture   Final    NO GROWTH 1 DAY Performed at [facility], [address], [place], [place] [address]    Report Status PENDING  Incomplete  C Difficile Quick Screen w PCR reflex     Status: None   Collection Time: 06/18/22 10:00 AM   Specimen: STOOL  Result Value Ref Range Status   C Diff antigen NEGATIVE NEGATIVE Final   C Diff toxin NEGATIVE NEGATIVE Final   C Diff interpretation No C. difficile detected.  Final    Comment: Performed at [facility], [address] [place], [place] [address]  Body fluid culture w Gram Stain     Status: None (Preliminary result)   Collection Time: 06/19/22  9:56 AM   Specimen:  PATH Cytology Pleural fluid  Result Value Ref Range Status   Specimen Description   Final    PLEURAL Performed at [facility], [address], [place], [place] [address]    Special Requests   Final    NONE Performed at [facility], [address] [place], [place] [address]    Gram Stain   Final    RARE WBC PRESENT, PREDOMINANTLY MONONUCLEAR NO ORGANISMS SEEN Performed at [facility], [place] [address], [place], [place] [address]    Culture PENDING  Incomplete   Report Status PENDING  Incomplete  Radiology Studies: US THORACENTESIS ASP PLEURAL SPACE W/IMG GUIDE  Result Date: 06/19/2022 INDICATION: Patient with history of known AFib, CKD, CHF and history of adenocarcinoma right lung status post radiation. Patient has recurrent right pleural effusion. Request for diagnostic and therapeutic right thoracentesis. EXAM: ULTRASOUND GUIDED DIAGNOSTIC AND THERAPEUTIC RIGHT THORACENTESIS MEDICATIONS: 15 mL 1% lidocaine and topical lidocaine spray COMPLICATIONS: None immediate. PROCEDURE: An ultrasound guided thoracentesis was thoroughly discussed with the patient and questions answered. The benefits, risks, alternatives and complications were also discussed. The patient understands and wishes to proceed with the procedure. Written consent was obtained. Ultrasound was performed to localize and mark an adequate pocket of fluid in the right chest. The area was then prepped and draped in the normal sterile fashion. 1% Lidocaine was used for local anesthesia. Under ultrasound guidance a 6 Fr Safe-T-Centesis catheter was introduced. Thoracentesis was performed. The catheter was removed and a dressing applied. FINDINGS: A total of approximately 1.2 L of clear, amber fluid was removed. Samples were sent to the laboratory as requested by the clinical team. IMPRESSION: Successful ultrasound guided right thoracentesis yielding 1.2 L of pleural fluid. Read by: Narda Rutherford, AGNP-BC  Electronically Signed   By: Albin Felling M.D.   On: 06/19/2022 11:20   DG Chest Port 1 View  Result Date: 06/19/2022 CLINICAL DATA:  Status post thoracentesis EXAM: PORTABLE CHEST 1 VIEW COMPARISON:  Chest radiograph 1 day prior FINDINGS: The cardiomediastinal silhouette is stable. The right pleural effusion has decreased since the prior study with improved aeration of the right base. Nodular appearing opacities in the right hilar and suprahilar region are similar to the prior study and better assessed on recent CT. There is no new or worsening focal airspace disease. There is no significant left effusion. There is no pneumothorax The bones are stable. IMPRESSION: Decreased right pleural effusion following thoracentesis with improved aeration of the right lung. No pneumothorax. Electronically Signed   By: Valetta Mole M.D.   On: 06/19/2022 10:21   DG Chest Port 1 View  Result Date: 06/18/2022 CLINICAL DATA:  Follow-up study.  Pleural effusion. EXAM: PORTABLE CHEST 1 VIEW COMPARISON:  06/16/2022 and older exams. FINDINGS: Moderate right pleural effusion obscures hemidiaphragm and portions of the right heart border. There is additional hazy opacity in the right mid to lower lung consistent with a layering component of the pleural effusion and associated compressive atelectasis. Right upper lung is clear. Left lung is hyperexpanded, but clear. Cardiac silhouette mildly enlarged. IMPRESSION: 1. No significant change from the most recent prior study. 2. Moderate right pleural effusion with associated dependent atelectasis. Electronically Signed   By: Lajean Manes M.D.   On: 06/18/2022 10:31    Scheduled Meds:  amiodarone  200 mg Oral BID   apixaban  2.5 mg Oral BID   atorvastatin  40 mg Oral Daily   fluticasone furoate-vilanterol  1 puff Inhalation Daily   And   umeclidinium bromide  1 puff Inhalation Daily   metoprolol tartrate  25 mg Oral BID   pantoprazole  40 mg Oral BID AC   sucralfate  0.5 g  Oral BID   [START ON 06/20/2022] torsemide  20 mg Oral Daily   Continuous Infusions:  sodium chloride 10 mL/hr at 06/18/22 0420   azithromycin Stopped (06/19/22 0431)   cefTRIAXone (ROCEPHIN)  IV 2 g (06/19/22 0621)     LOS: 2 days    Time spent: 35 mins    Lynette Topete, MD [facility]   If 7PM-7AM,  please contact night-coverage

## 2022-06-19 NOTE — Progress Notes (Signed)
Initial Nutrition Assessment  DOCUMENTATION CODES:   Not applicable  INTERVENTION:   -Nepro Shake po BID, each supplement provides 425 kcal and 19 grams protein  -MVI with minerals daily -RD provided "Heart Failure Nutrition Therapy for the Undernourished" handout from [facility] Nutrition Care Manual; attached to AVS/ discharge summary  NUTRITION DIAGNOSIS:   Increased nutrient needs related to chronic illness (lung cancer, CHF) as evidenced by estimated needs.  GOAL:   Patient will meet greater than or equal to 90% of their needs  MONITOR:   PO intake, Supplement acceptance  REASON FOR ASSESSMENT:   Rounds    ASSESSMENT:   Pt with PMH significant for lung Cancer, CHF combined systolic and diastolic, COPD, recurrent right pleural effusion, chronic hypoxic respiratory failure on 4 L of supplemental oxygen at baseline, Esophageal CA, paroxysmal A-fib, GERD, CKD stage IIIb, hyperlipidemia, hypertension presented with complaints of worsening shortness of breath for 1 week  Pt admitted with acute on chronic respiratory failure with streptococcal pneumonia.   Reviewed I/O's: +171 ml x 24 hours and -79 ml since admission  UOP: 600 ml x 24 hours   Pt unavailable at time of visit. Attempted to speak with pt via call to hospital room phone, however, unable to reach. RD unable to obtain further nutrition-related history or complete nutrition-focused physical exam at this time.    Pt on a regular diet. No meal completion data available to assess at this time.   Pt familiar to this RD due to multiple previous admissions. Pt with chronically poor appetite and has history of severe malnutrition, which suspect is ongoing. Pt likes Nepro supplements, so will order to help optimize nutritional intake.  Reviewed wt hx; wt has been stable over the past 3 months.   Medications reviewed and include demadex.   Labs reviewed: CBGS: 116.   Diet Order:   Diet Order             Diet regular  Room service appropriate? Yes; Fluid consistency: Thin  Diet effective now                   EDUCATION NEEDS:   No education needs have been identified at this time  Skin:  Skin Assessment: Reviewed RN Assessment  Last BM:  06/19/22 (type 6)  Height:   Ht Readings from Last 1 Encounters:  06/18/22 5\' 5"  (1.651 m)    Weight:   Wt Readings from Last 1 Encounters:  06/18/22 50.7 kg    Ideal Body Weight:  56.8 kg  BMI:  Body mass index is 18.6 kg/m.  Estimated Nutritional Needs:   Kcal:  1550-1750  Protein:  80-95 grams  Fluid:  > 1.5 L    Loistine Chance, RD, LDN, [place] Registered Dietitian II Certified Diabetes Care and Education Specialist Please refer to [facility] for RD and/or RD on-call/weekend/after hours pager

## 2022-06-19 NOTE — Procedures (Signed)
PROCEDURE SUMMARY:  Successful US guided diagnostic and therapeutic right thoracentesis. Yielded 1.2 L of clear, amber fluid. Pt tolerated procedure well. No immediate complications.  Specimen was sent for labs. CXR ordered.  EBL < 1 mL  Tyson Alias, AGNP 06/19/2022 9:37 AM

## 2022-06-19 NOTE — Progress Notes (Signed)
[place] NOTE       Patient ID: Danielle [name] MRN: [id number] DOB/AGE: June 21, 1956 66 y.o.  Admit date: 06/16/2022 Referring Physician Dwyane Dee  Primary Physician [name], PA-C Primary Cardiologist [name] Reason for Consultation SOB   HPI: Danielle [name] is a 69yoF with a PMH of advanced COPD on 3L O2, paroxysmal atrial fibrillation/flutter (dose reduced eliquis held due to recent epistaxis), HFrEF (LVEF 35-40%, g2dd, mod-sev TR, mod AS, mod MS 01/2022), recurrent Right pleural effusion, CKD (baseline Cr. 2-2.5), remote history of lung cancer s/p radiation, esophageal cancer s/p radiation, MGUS, who presented to [facility] ED 06/16/2022 with shortness of breath x1 week, pleuritic chest discomfort with associated palpitations and dizziness.  She was initially requiring BiPAP, has now been weaned to 6 L by nasal cannula.  Urinary strep pneumo antigen positive, she was started on empiric ceftriaxone and azithromycin.  Cardiology is consulted for assistance with her A-fib with RVR and heart failure.  Interval History: -breathing better after R thoracentesis this morning (1.2L fluid aspirated) -seen eating breakfast, had some difficulty swallowing one bite of scrambled egg, spit it up, and continued eating without difficulty  -no chest pain, dizziness, palpitations -in sinus rhythm on telemetry   Review of systems complete and found to be negative unless listed above     Past Medical History:  Diagnosis Date   Acute respiratory failure due to COVID-19 ([place]) 09/27/2019   Anemia    Anxiety    Aortic atherosclerosis ([place])    Aortic valve stenosis 02/10/2018   a.) TTE 02/10/2018: EF 55-60%: mild AS with MPG of 12 mmHg. b.) TTE 04/21/2018: EF 35-40%; mild AS with MPG 8 mmHg. c.) TTE 11/07/2019: EF 50-55%; mild AS with MPG 10 mmHg.   Arthritis    Atrial fibrillation (HCC)    a.) CHA2DS2-VASc = 4 (age, sex, HTN, aortic plaque). b.) Rate/rhythm maintained on oral  carvedilol; chronically anticoagulated on dose reduced apixaban. c.)  Attempted deployment of LAA occlusive device on 01/11/2021; parameters failed and procedure aborted.   Breast cancer, left ([place]) 2000   a.) T2N1M0; ER/PR (+) --> Tx'd with total mastectomy, LN resection, XRT, and chemotherapy   Cancer of right lung ([place]) 07/30/2016   a.) adenocarcinoma; ALK, ROS1, PDL1, BRAF, EGFR all negative.   Chronic diastolic CHF (congestive heart failure) ([place]) 11/26/2021   CKD (chronic kidney disease), stage IV (HCC)    COPD (chronic obstructive pulmonary disease) (HCC)    Dependence on supplemental oxygen    Depression    Diastolic dysfunction 76/28/3151   a.) TTE 02/10/2017: EF 55-60%; G2DD. b.) TTE 04/21/2018: EF 35-40%; mild LA dilation; mod MV regurgitation. c.) TTE 11/07/2019: EF 50-55%; G1DD.   DOE (dyspnea on exertion)    GERD (gastroesophageal reflux disease)    Heart murmur    History of 2019 novel coronavirus disease (COVID-19) 10/14/2019   HLD (hyperlipidemia)    Hypertension    Long term current use of anticoagulant    a.) apixaban   Lymphedema    Personal history of chemotherapy    Personal history of radiation therapy    Respiratory tract infection due to COVID-19 virus 09/27/2019   SBO (small bowel obstruction) ([place]) 11/05/2020   Vitamin D deficiency     Past Surgical History:  Procedure Laterality Date   Breast Biospy Left    [facility]   BREAST SURGERY     CARDIOVERSION N/A 02/23/2022   Procedure: CARDIOVERSION;  Surgeon: Corey Skains, MD;  Location: [facility] ORS;  Service:  Cardiovascular;  Laterality: N/A;   COLONOSCOPY N/A 04/30/2018   Procedure: COLONOSCOPY;  Surgeon: Virgel Manifold, MD;  Location: [facility] ENDOSCOPY;  Service: Endoscopy;  Laterality: N/A;   COLONOSCOPY N/A 07/22/2018   Procedure: COLONOSCOPY;  Surgeon: Virgel Manifold, MD;  Location: [facility] ENDOSCOPY;  Service: Endoscopy;  Laterality: N/A;   COLONOSCOPY WITH PROPOFOL N/A 09/21/2021   Procedure:  COLONOSCOPY WITH PROPOFOL;  Surgeon: Benjamine Sprague, DO;  Location: [facility] ENDOSCOPY;  Service: General;  Laterality: N/A;   COLONOSCOPY WITH PROPOFOL N/A 03/11/2022   Procedure: COLONOSCOPY WITH PROPOFOL;  Surgeon: Annamaria Helling, DO;  Location: [facility] ENDOSCOPY;  Service: Gastroenterology;  Laterality: N/A;   DILATION AND CURETTAGE OF UTERUS     ELECTROMAGNETIC NAVIGATION BROCHOSCOPY Right 04/11/2016   Procedure: ELECTROMAGNETIC NAVIGATION BRONCHOSCOPY;  Surgeon: Vilinda Boehringer, MD;  Location: [facility] ORS;  Service: Cardiopulmonary;  Laterality: Right;   ESOPHAGOGASTRODUODENOSCOPY N/A 07/22/2018   Procedure: ESOPHAGOGASTRODUODENOSCOPY (EGD);  Surgeon: Virgel Manifold, MD;  Location: [facility] ENDOSCOPY;  Service: Endoscopy;  Laterality: N/A;   ESOPHAGOGASTRODUODENOSCOPY (EGD) WITH PROPOFOL N/A 05/07/2018   Procedure: ESOPHAGOGASTRODUODENOSCOPY (EGD) WITH PROPOFOL;  Surgeon: Lucilla Lame, MD;  Location: [facility] ENDOSCOPY;  Service: Endoscopy;  Laterality: N/A;   ESOPHAGOGASTRODUODENOSCOPY (EGD) WITH PROPOFOL N/A 04/24/2019   Procedure: ESOPHAGOGASTRODUODENOSCOPY (EGD) WITH PROPOFOL;  Surgeon: Jonathon Bellows, MD;  Location: [facility] ENDOSCOPY;  Service: Gastroenterology;  Laterality: N/A;   ESOPHAGOGASTRODUODENOSCOPY (EGD) WITH PROPOFOL N/A 01/12/2020   Procedure: ESOPHAGOGASTRODUODENOSCOPY (EGD) WITH PROPOFOL;  Surgeon: Jonathon Bellows, MD;  Location: [facility] ENDOSCOPY;  Service: Gastroenterology;  Laterality: N/A;   ESOPHAGOGASTRODUODENOSCOPY (EGD) WITH PROPOFOL N/A 04/28/2020   Procedure: ESOPHAGOGASTRODUODENOSCOPY (EGD) WITH PROPOFOL;  Surgeon: Jonathon Bellows, MD;  Location: [facility] ENDOSCOPY;  Service: Gastroenterology;  Laterality: N/A;   ESOPHAGOGASTRODUODENOSCOPY (EGD) WITH PROPOFOL N/A 03/11/2022   Procedure: ESOPHAGOGASTRODUODENOSCOPY (EGD) WITH PROPOFOL;  Surgeon: Annamaria Helling, DO;  Location: [facility] ENDOSCOPY;  Service: Gastroenterology;  Laterality: N/A;   EUS N/A 05/07/2019   Procedure: FULL UPPER  ENDOSCOPIC ULTRASOUND (EUS) RADIAL;  Surgeon: Jola Schmidt, MD;  Location: [facility] ENDOSCOPY;  Service: Endoscopy;  Laterality: N/A;   ILEOSCOPY N/A 07/22/2018   Procedure: ILEOSCOPY THROUGH STOMA;  Surgeon: Virgel Manifold, MD;  Location: [facility] ENDOSCOPY;  Service: Endoscopy;  Laterality: N/A;   ILEOSTOMY     ILEOSTOMY N/A 09/08/2018   Procedure: ILEOSTOMY REVISION POSSIBLE CREATION;  Surgeon: Herbert Pun, MD;  Location: [facility] ORS;  Service: General;  Laterality: N/A;   ILEOSTOMY CLOSURE N/A 08/15/2018   Procedure: DILATION OF ILEOSTOMY STRICTURE;  Surgeon: Herbert Pun, MD;  Location: [facility] ORS;  Service: General;  Laterality: N/A;   LAPAROTOMY Right 05/04/2018   Procedure: EXPLORATORY LAPAROTOMY right colectomy right and left ostomy;  Surgeon: Herbert Pun, MD;  Location: [facility] ORS;  Service: General;  Laterality: Right;   LEFT ATRIAL APPENDAGE OCCLUSION N/A 01/11/2021   Procedure: LEFT ATRIAL APPENDAGE OCCLUSION ([place]); ABORTED PROCEDURE WITHOUT DEVICE BEING IMPLANTED; Location: [facility]; Surgeon: Mylinda Latina, MD   LUNG BIOPSY     MASTECTOMY Left    2000, [place]   ROTATOR CUFF REPAIR Right    [place] N/A 10/23/2021   Procedure: XI ROBOTIC ASSISTED ILEOSTOMY TAKEDOWN;  Surgeon: Herbert Pun, MD;  Location: [facility] ORS;  Service: General;  Laterality: N/A;  180 minutes for the surgery part please    Medications Prior to Admission  Medication Sig Dispense Refill Last Dose   amiodarone (PACERONE) 200 MG tablet Take 1 tablet (200 mg total) by mouth 2 (two) times  daily. 60 tablet 0 Past Week   amLODipine (NORVASC) 5 MG tablet Take 5 mg by mouth daily.   Past Week   apixaban (ELIQUIS) 2.5 MG TABS tablet Take 1 tablet (2.5 mg total) by mouth 2 (two) times daily. 60 tablet 0 Past Week   atorvastatin (LIPITOR) 40 MG tablet Take 1 tablet (40 mg total) by mouth daily. 30 tablet 2 Past Week   calcium-vitamin D (OSCAL WITH D)  500-5 MG-MCG tablet Take 1 tablet by mouth daily.   Past Week   citalopram (CELEXA) 20 MG tablet Take 1 tablet (20 mg total) by mouth daily. (Patient taking differently: Take 40 mg by mouth daily.) 30 tablet 2 Past Week   ferrous sulfate 325 (65 FE) MG tablet Take 325 mg by mouth 2 (two) times daily with a meal.   Past Week   Fluticasone-Umeclidin-Vilant (TRELEGY ELLIPTA) 100-62.5-25 MCG/ACT AEPB Inhale 1 puff into the lungs daily.   Past Week   hydrALAZINE (APRESOLINE) 10 MG tablet Take 1 tablet (10 mg total) by mouth 3 (three) times daily. 90 tablet 0 Past Week   isosorbide dinitrate (ISORDIL) 5 MG tablet Take 1 tablet (5 mg total) by mouth 3 (three) times daily. 90 tablet 0 Past Week   metoprolol tartrate (LOPRESSOR) 25 MG tablet Take 1 tablet (25 mg total) by mouth 2 (two) times daily. 60 tablet 2 Past Week   Multiple Vitamin (MULTIVITAMIN WITH MINERALS) TABS tablet Take 1 tablet by mouth daily.   Past Week   OXYGEN Inhale 4 L into the lungs daily.   Past Week   pantoprazole (PROTONIX) 40 MG tablet Take 1 tablet (40 mg total) by mouth 2 (two) times daily before a meal. 60 tablet 2 Past Week   sodium bicarbonate 650 MG tablet Take 1,300 mg by mouth daily.   Past Week   sucralfate (CARAFATE) 1 GM/10ML suspension Take 0.5 g by mouth 2 (two) times daily.   Past Week   Torsemide 40 MG TABS Take 20 mg by mouth daily. Hold this medication until you see your cardiologist   Past Week   acetaminophen (TYLENOL) 325 MG tablet Take 2 tablets (650 mg total) by mouth every 6 (six) hours as needed for mild pain (or Fever >/= 101). (Patient taking differently: Take 650 mg by mouth every 4 (four) hours as needed for moderate pain.)   prn at prn   albuterol (PROVENTIL HFA;VENTOLIN HFA) 108 (90 Base) MCG/ACT inhaler Inhale 2 puffs into the lungs every 6 (six) hours as needed for wheezing or shortness of breath. 1 Inhaler 2 prn at prn   calcium carbonate (TUMS - DOSED IN MG ELEMENTAL CALCIUM) 500 MG chewable tablet  Chew 0.5 tablets by mouth daily as needed.   prn at prn   loperamide (IMODIUM) 2 MG capsule Take 2 mg by mouth daily as needed for diarrhea or loose stools.   prn at prn   zolpidem (AMBIEN) 5 MG tablet Take 5 mg by mouth at bedtime as needed for sleep.   prn   Social History   Socioeconomic History   Marital status: Divorced    Spouse name: Not on file   Number of children: 3   Years of education: Not on file   Highest education level: Not on file  Occupational History   Occupation: Retired   Tobacco Use   Smoking status: Former    Packs/day: 0.50    Years: 20.00    Total pack years: 10.00    Types: Cigarettes  Quit date: 07/02/2012    Years since quitting: 9.9   Smokeless tobacco: Current    Types: Snuff  Vaping Use   Vaping Use: Never used  Substance and Sexual Activity   Alcohol use: Yes    Alcohol/week: 3.0 standard drinks of alcohol    Types: 3 Cans of beer per week    Comment: Occasionally beer   Drug use: No   Sexual activity: Not Currently  Other Topics Concern   Not on file  Social History Narrative   Not on file   Social Determinants of Health   Financial Resource Strain: High Risk (07/05/2018)   Overall Financial Resource Strain (CARDIA)    Difficulty of Paying Living Expenses: Hard  Food Insecurity: Food Insecurity Present (07/05/2018)   Hunger Vital Sign    Worried About Running Out of Food in the Last Year: Often true    Ran Out of Food in the Last Year: Often true  Transportation Needs: No Transportation Needs (02/28/2022)   PRAPARE - Hydrologist (Medical): No    Lack of Transportation (Non-Medical): No  Physical Activity: Insufficiently Active (07/05/2018)   Exercise Vital Sign    Days of Exercise per Week: 2 days    Minutes of Exercise per Session: 30 min  Stress: No Stress Concern Present (07/05/2018)   [place]    Feeling of Stress : Not at all   Social Connections: Somewhat Isolated (07/05/2018)   Social Connection and Isolation Panel [NHANES]    Frequency of Communication with Friends and Family: More than three times a week    Frequency of Social Gatherings with Friends and Family: More than three times a week    Attends Religious Services: 1 to 4 times per year    Active Member of [employer] or Organizations: No    Attends Archivist Meetings: Never    Marital Status: Divorced  Human resources officer Violence: Not At Risk (07/05/2018)   Humiliation, Afraid, Rape, and Kick questionnaire    Fear of Current or Ex-Partner: No    Emotionally Abused: No    Physically Abused: No    Sexually Abused: No    Family History  Problem Relation Age of Onset   Breast cancer Mother 39   Cancer Mother        Breast    Cirrhosis Father    Breast cancer Paternal Aunt 68   Cancer Maternal Aunt        Breast       PHYSICAL EXAM General: Elderly and thin black female, in no acute distress.  Sitting upright eating breakfast HEENT:  Normocephalic and atraumatic. Neck:  No JVD.  Lungs: Normal respiratory effort on 4 L by nasal cannula.  Decreased breath sounds bilaterally without appreciable crackles or wheezes.   Heart: HRRR . Normal S1 and S2.  3/6 systolic murmur best heard at the RUSB Abdomen: Non-distended appearing.  Msk: Normal strength and tone for age. Extremities: Warm and well perfused. No clubbing, cyanosis.  No peripheral edema.  Neuro: Alert and oriented X 3. Psych:  Answers questions appropriately.   Labs: Basic Metabolic Panel: Recent Labs    06/18/22 0923 06/19/22 0526  NA 142 142  K 4.1 3.8  CL 108 106  CO2 27 26  GLUCOSE 105* 131*  BUN 28* 33*  CREATININE 1.61* 1.72*  CALCIUM 8.9 8.9  MG 1.5* 2.2  PHOS 3.0 2.5   Liver Function Tests: Recent  Labs    06/16/22 2024 06/17/22 1044  AST 24 23  ALT 18 17  ALKPHOS 58 52  BILITOT 0.7 1.1  PROT 7.4 7.4  ALBUMIN 3.7 3.4*   No results for input(s):  "LIPASE", "AMYLASE" in the last 72 hours. CBC: Recent Labs    06/16/22 2024 06/17/22 1044 06/18/22 0438 06/18/22 0923 06/19/22 0526  WBC 6.5 3.1*   < > 5.9 5.6  NEUTROABS 4.2 2.5  --   --   --   HGB 10.2* 8.4*   < > 8.7* 8.0*  HCT 32.0* 26.4*   < > 26.4* 24.6*  MCV 97.6 97.4   < > 95.0 95.0  PLT 228 244   < > 224 216   < > = values in this interval not displayed.   Cardiac Enzymes: Recent Labs    06/17/22 0010 06/17/22 0600 06/17/22 1044  TROPONINIHS 22* 20* 20*   BNP: Invalid input(s): "POCBNP" D-Dimer: Recent Labs    06/16/22 2039  DDIMER 3.50*   Hemoglobin A1C: Recent Labs    06/17/22 1044  HGBA1C 4.4*   Fasting Lipid Panel: No results for input(s): "CHOL", "HDL", "LDLCALC", "TRIG", "CHOLHDL", "LDLDIRECT" in the last 72 hours. Thyroid Function Tests: Recent Labs    06/17/22 1044  TSH 0.369   Anemia Panel: No results for input(s): "VITAMINB12", "FOLATE", "FERRITIN", "TIBC", "IRON", "RETICCTPCT" in the last 72 hours.  DG Chest Port 1 View  Result Date: 06/18/2022 CLINICAL DATA:  Follow-up study.  Pleural effusion. EXAM: PORTABLE CHEST 1 VIEW COMPARISON:  06/16/2022 and older exams. FINDINGS: Moderate right pleural effusion obscures hemidiaphragm and portions of the right heart border. There is additional hazy opacity in the right mid to lower lung consistent with a layering component of the pleural effusion and associated compressive atelectasis. Right upper lung is clear. Left lung is hyperexpanded, but clear. Cardiac silhouette mildly enlarged. IMPRESSION: 1. No significant change from the most recent prior study. 2. Moderate right pleural effusion with associated dependent atelectasis. Electronically Signed   By: Lajean Manes M.D.   On: 06/18/2022 10:31     Radiology: [facility] Chest Port 1 View  Result Date: 06/18/2022 CLINICAL DATA:  Follow-up study.  Pleural effusion. EXAM: PORTABLE CHEST 1 VIEW COMPARISON:  06/16/2022 and older exams. FINDINGS: Moderate right  pleural effusion obscures hemidiaphragm and portions of the right heart border. There is additional hazy opacity in the right mid to lower lung consistent with a layering component of the pleural effusion and associated compressive atelectasis. Right upper lung is clear. Left lung is hyperexpanded, but clear. Cardiac silhouette mildly enlarged. IMPRESSION: 1. No significant change from the most recent prior study. 2. Moderate right pleural effusion with associated dependent atelectasis. Electronically Signed   By: Lajean Manes M.D.   On: 06/18/2022 10:31   CT Angio Chest PE W and/or Wo Contrast  Result Date: 06/16/2022 CLINICAL DATA:  Pulmonary embolism (PE) suspected, positive D-dimer Increasing shortness of breath.  History of COPD. EXAM: CT ANGIOGRAPHY CHEST WITH CONTRAST TECHNIQUE: Multidetector CT imaging of the chest was performed using the standard protocol during bolus administration of intravenous contrast. Multiplanar CT image reconstructions and MIPs were obtained to evaluate the vascular anatomy. RADIATION DOSE REDUCTION: This exam was performed according to the departmental dose-optimization program which includes automated exposure control, adjustment of the mA and/or kV according to patient size and/or use of iterative reconstruction technique. CONTRAST:  2mL OMNIPAQUE IOHEXOL 350 MG/ML SOLN COMPARISON:  Radiograph earlier today. Most recent chest CT 02/21/2022, additional  chest CTs reviewed. Included lung bases from prior abdominal CTs reviewed. FINDINGS: Cardiovascular: There are no filling defects within the pulmonary arteries to suggest pulmonary embolus. Chronically dilated main pulmonary artery at 3.3 cm. Right left main pulmonary arteries are also dilated. Advanced aortic and branch atherosclerosis. Cannot assess for dissection due to phase of contrast tailored to pulmonary artery assessment. There is contrast refluxing into the hepatic veins and IVC. Chronic cardiomegaly. Coronary artery  calcifications. No pericardial effusion. Mediastinum/Nodes: Soft tissue density both hila likely represent lymphoid tissue. No mediastinal adenopathy. The previous prominent subcarinal node is not well-defined on the current exam. Small hiatal hernia again seen. Lungs/Pleura: Moderate to large right pleural effusion, chronic, increased from prior CT as well as most recent abdominal CT 05/08/2022. There is associated compressive atelectasis in the lower lobe. Trace left pleural effusion with atelectasis in the lower lobe. Spiculated nodular opacity in the right upper lobe measures 2.5 x 1.8 cm, series 5, image 55, previously 2.5 x 1.5 cm. Masslike opacity in the anterior right upper lobe extending towards the pleural surface has slightly diminished currently measuring 3.5 x 1.9 cm, series 5, image 65 previously 3.8 x 2.1 cm. Moderate emphysema. Marked bronchial thickening particularly in the lower lobes with areas of mucous plugging and mucoid impaction. Bronchial thickening that has significantly progressed. Clustered nodular opacities in the superior segment of the left lower lobe measuring up to 9 mm, series 5, image 60, new. Chronic left apical scarring which is partially calcified. Upper Abdomen: Contrast refluxes into the hepatic veins and IVC. Atherosclerotic calcifications of the abdominal aorta, including coarse calcifications at the diaphragmatic hiatus. Dense aortic branch calcifications in the included upper abdomen. No free fluid or acute findings. Musculoskeletal: Chronic L2 superior endplate compression fracture. Left anterior rib fractures are remote. No acute osseous findings. There is minimal edema in the right lower lateral chest wall subcutaneous tissues. Review of the MIP images confirms the above findings. IMPRESSION: 1. No pulmonary embolus. 2. Moderate to large right pleural effusion, chronic, increased from prior CT as well as most recent abdominal CT 05/08/2022. There is associated  compressive atelectasis in the right lower lobe. Trace left pleural effusion with atelectasis in the lower lobes. 3. Marked bronchial thickening particularly in the lower lobes with areas of mucous plugging and mucoid impaction. Progressive bronchial inflammation from prior. 4. Clustered nodular opacities in the superior segment of the left lower lobe measuring up to 9 mm, new from prior CT. This may be infectious, inflammatory, or neoplastic. Recommend follow-up CT in 3 months or PET characterization. 5. Areas of masslike opacity in the right upper lobe are stable or slightly improved from prior exam. This may represent treated disease. Attention at follow-up recommended. 6. Chronic cardiomegaly. Coronary artery calcifications. 7. Chronically dilated main pulmonary artery consistent with pulmonary arterial hypertension. Aortic Atherosclerosis (ICD10-I70.0) and Emphysema (ICD10-J43.9). Electronically Signed   By: Keith Rake M.D.   On: 06/16/2022 23:27   DG Chest Portable 1 View  Result Date: 06/16/2022 CLINICAL DATA:  Shortness of breath EXAM: PORTABLE CHEST 1 VIEW COMPARISON:  Previous studies including the examination of 05/22/2022 FINDINGS: Transverse diameter of heart is increased. Central pulmonary vessels are more prominent. There is prominence of interstitial markings in the parahilar regions and lower lung fields. There is small right pleural effusion. Left lateral CP angle is clear. There is no pneumothorax. IMPRESSION: Cardiomegaly. Central pulmonary vessels are more prominent suggesting CHF. Small right pleural effusion. Electronically Signed   By: Prudy Feeler.D.  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 Patient has recurrent right pleural effusion. Request for therapeutic right thoracentesis.  EXAM: ULTRASOUND GUIDED THERAPEUTIC RIGHT THORACENTESIS MEDICATIONS: 10 mL 1 % lidocaine COMPLICATIONS: None immediate. PROCEDURE: An ultrasound guided thoracentesis was thoroughly discussed with the patient and questions answered. The benefits, risks, alternatives and complications were also discussed. The patient understands and wishes to proceed with the procedure. Written consent was obtained. Ultrasound was performed to localize and mark an adequate pocket of fluid in the right chest. The area was then prepped and draped in the normal sterile fashion. 1% Lidocaine was used for local anesthesia. Under ultrasound guidance a 6 Fr Safe-T-Centesis catheter was introduced. Thoracentesis was performed. The catheter was removed and a dressing applied. FINDINGS: A total of approximately 900 cc of clear, amber fluid was removed. IMPRESSION: Successful ultrasound guided right thoracentesis yielding 900 cc of pleural fluid. Read by: Narda Rutherford, AGNP-BC Electronically Signed   By: Albin Felling M.D.   On: 05/23/2022 08:49   DG Chest Port 1 View  Result Date: 05/22/2022 CLINICAL DATA:  Right-sided thoracentesis. EXAM: PORTABLE CHEST 1 VIEW COMPARISON:  Earlier same day. FINDINGS: Significant interval improvement patient's right-sided effusion with only small amount of residual right pleural fluid noted. Patient is post right-sided thoracentesis. No evidence of pneumothorax. Stable density over the right mid to upper lung as well as stable prominence of the right hilum. Left lung is clear. Cardiomediastinal silhouette and remainder of the exam is unchanged. IMPRESSION: 1. Significant interval improvement in patient's right-sided pleural effusion post thoracentesis. No pneumothorax. Minimal residual right pleural fluid. 2. Stable changes over the right mid to upper lung and stable prominence of the right hilum. Electronically Signed   By: Marin Olp M.D.   On: 05/22/2022 17:58   DG Chest Portable 1 View  Result  Date: 05/22/2022 CLINICAL DATA:  SOB EXAM: PORTABLE CHEST 1 VIEW COMPARISON:  Chest x-ray 05/08/2022, CT chest 02/21/2022 FINDINGS: The heart and mediastinal contours are unchanged. Atherosclerotic plaque. No focal consolidation. Chronic coarsened markings with no overt pulmonary edema. Interval increase in a small to moderate volume right pleural effusion. No pneumothorax. No acute osseous abnormality. IMPRESSION: 1. Interval increase in a small to moderate right pleural effusion. 2. Aortic Atherosclerosis (ICD10-I70.0) and Emphysema (ICD10-J43.9). Electronically Signed   By: Iven Finn M.D.   On: 05/22/2022 16:25    ECHO 01/25/2022 IMPRESSIONS     1. Ant/apical/setal hpo.   2. Left ventricular ejection fraction, by estimation, is 35 to 40%. The  left ventricle has moderately decreased function. The left ventricle  demonstrates regional wall motion abnormalities (see scoring  diagram/findings for description). The left  ventricular internal cavity size was mildly dilated. Left ventricular  diastolic parameters are consistent with Grade II diastolic dysfunction  (pseudonormalization).   3. Right ventricular systolic function is low normal. The right  ventricular size is normal.   4. The mitral valve is normal in structure. Moderate mitral valve  regurgitation.   5. Tricuspid valve regurgitation is moderate to severe.   6. The aortic valve is normal in structure. Aortic valve regurgitation is  not visualized. Moderate aortic valve stenosis.   FINDINGS   Left Ventricle: Left ventricular ejection fraction, by estimation, is 35  to 40%. The left ventricle has moderately decreased function. The left  ventricle demonstrates regional  wall motion abnormalities. The left  ventricular internal cavity size was  mildly dilated. There is borderline concentric left ventricular  hypertrophy. Left ventricular diastolic parameters are consistent with  Grade II diastolic dysfunction  (pseudonormalization).   Right Ventricle: The right ventricular size is normal. No increase in  right ventricular wall thickness. Right ventricular systolic function is  low normal.   Left Atrium: Left atrial size was normal in size.   Right Atrium: Right atrial size was not assessed.   Pericardium: There is no evidence of pericardial effusion.   Mitral Valve: The mitral valve is normal in structure. Moderate mitral  valve regurgitation.   Tricuspid Valve: The tricuspid valve is grossly normal. Tricuspid valve  regurgitation is moderate to severe.   Aortic Valve: The aortic valve is normal in structure. Aortic valve  regurgitation is not visualized. Moderate aortic stenosis is present.  Aortic valve mean gradient measures 6.3 mmHg. Aortic valve peak gradient  measures 11.6 mmHg. Aortic valve area, by  VTI measures 0.80 cm.   Pulmonic Valve: The pulmonic valve was normal in structure. Pulmonic valve  regurgitation is trivial.   Aorta: The ascending aorta was not well visualized.   IAS/Shunts: No atrial level shunt detected by color flow Doppler.   Additional Comments: Ant/apical/setal hpo.      LEFT VENTRICLE  PLAX 2D  LVIDd:         3.90 cm  LVIDs:         3.00 cm  LV PW:         1.30 cm  LV IVS:        0.85 cm  LVOT diam:     2.00 cm  LV SV:         20  LV SV Index:   12  LVOT Area:     3.14 cm     LV Volumes (MOD)  LV vol d, MOD A2C: 35.6 ml  LV vol d, MOD A4C: 62.7 ml  LV vol s, MOD A2C: 23.2 ml  LV vol s, MOD A4C: 42.8 ml  LV SV MOD A2C:     12.4 ml  LV SV MOD A4C:     62.7 ml  LV SV MOD BP:      18.0 ml   RIGHT VENTRICLE  RV Basal diam:  3.70 cm  RV S prime:     10.20 cm/s  TAPSE (M-mode): 1.1 cm   LEFT ATRIUM           Index        RIGHT ATRIUM           Index  LA diam:      3.20 cm 2.00 cm/m   RA Area:     21.70 cm  LA Vol (A2C): 48.6 ml 30.37 ml/m  RA Volume:   71.60 ml  44.74 ml/m  LA Vol (A4C): 15.7 ml 9.81 ml/m   AORTIC VALVE                      PULMONIC VALVE  AV Area (Vmax):    0.81 cm      PV Vmax:        0.39 m/s  AV Area (Vmean):   0.84 cm      PV Vmean:       27.500 cm/s  AV Area (VTI):     0.80 cm      PV VTI:         0.051 m  AV Vmax:           170.00 cm/s   PV Peak grad:   0.6 mmHg  AV Vmean:          114.667 cm/s  PV Mean grad:   0.0 mmHg  AV VTI:            0.247 m       RVOT Peak grad: 2 mmHg  AV Peak Grad:      11.6 mmHg  AV Mean Grad:      6.3 mmHg  LVOT Vmax:         43.70 cm/s  LVOT Vmean:        30.800 cm/s  LVOT VTI:          0.063 m  LVOT/AV VTI ratio: 0.25     AORTA  Ao Root diam: 3.00 cm   MITRAL VALVE               TRICUSPID VALVE  MV Area (PHT): 8.34 cm    TR Peak grad:   42.5 mmHg  MV Decel Time: 91 msec     TR Vmax:        326.00 cm/s  MV E velocity: 70.30 cm/s  MV A velocity: 57.00 cm/s  SHUNTS  MV E/A ratio:  1.23        Systemic VTI:  0.06 m                             Systemic Diam: 2.00 cm                             Pulmonic VTI:  0.086 m   Yolonda Kida MD  Electronically signed by Yolonda Kida MD  Signature Date/Time: 01/30/2022/1:54:44 PM   TELEMETRY reviewed by me (LT) 06/19/2022 : NSR rate 60s  EKG reviewed by me: NSR 85 PVCs  Data reviewed by me (LT) 06/19/2022: ED note, admission H&P, hospitalist progress note, CBC, BNP, BMP, recent discharge summary, EKG, telemetry  ASSESSMENT AND PLAN:  Danielle [name] is a 36yoF with a PMH of advanced COPD on 3L O2, paroxysmal atrial fibrillation/flutter (dose reduced eliquis held due to recent epistaxis), HFrEF (LVEF 35-40%, g2dd, mod-sev TR, mod AS, mod MS 01/2022), recurrent Right pleural effusion, CKD (baseline Cr. 2-2.5), remote history of lung cancer s/p radiation, esophageal cancer s/p radiation, MGUS, who presented to [facility] ED 06/16/2022 with shortness of breath x1 week, pleuritic chest discomfort with associated palpitations and dizziness.  She was initially requiring BiPAP, has now been weaned to 6 L by nasal cannula.  Urinary  strep pneumo antigen positive, she was started on empiric ceftriaxone and azithromycin.  Cardiology is consulted for assistance with her A-fib with RVR and heart failure.  #Acute on chronic hypoxic respiratory failure Multifactorial in the setting of strep pneumo PNA, advanced COPD, and heart failure -On empiric antibiotics for treatment of pneumonia -Oxygen weaned back to 4 L today.  #Acute on chronic HFrEF (EF 35-40% 01/2022) #Recurrent right pleural effusion BNP elevated on admission at 2300, increasing to 3800 on 9/5. S/p right thoracentesis yielding 1.2 L clear amber fluid on 9/5.  Not grossly clinically volume overloaded appearing. -S/p IV Lasix 40 mg x 1, 60 mg x 3 with clinical improvement in her peripheral edema.  I's and O's not accurate. -We will restart home torsemide 20 mg once daily starting tomorrow -  Continue GDMT with metoprolol 25 twice daily -Will consider restarting low dose hydralazine/isordil tomorrow for afterload reduction/GDMT -Continue atorvastatin 40 mg daily  #Paroxysmal A-fib with RVR Reportedly in A-fib with RVR on admission.  Currently in sinus rhythm with heart rates in the 60s. -Continue amiodarone 200 mg once a day -Continue metoprolol 25 mg twice daily -Continue Eliquis 2.5 mg twice a day for stroke risk reduction.  CHA2DS2-VASc 4   #CKD 3 Renal function slightly worsening with diuresis from creatinine 1.5 and 38, today 1.7 and  32.  #Elevated troponin Trended 22-20-20 likely secondary to renal dysfunction versus demand and not ACS.  NumberThis patient's plan of care was discussed and created with Dr. [name] and he is in agreement.  Signed: Tristan Schroeder , PA-C 06/19/2022, 9:50 AM [facility]  The patient continues to have small improvements in her cardiovascular symptoms.  She is still short of breath on 4 L of oxygen although this is typical for her significant COPD.  In addition to that the patient has paroxysmal atrial  fibrillation on amiodarone and currently stable in normal sinus rhythm at 60 bpm.  She does have LV systolic dysfunction with moderate aortic valve stenosis stable at this time status post right pleural effusion thoracentesis for which she tolerated well.  Therefore we will continue guideline medical therapy for her congestive heart failure looking for any further improvements.  The patient has been interviewed and examined. I agree with assessment and plan above. Serafina Royals MD [facility]

## 2022-06-19 NOTE — Discharge Instructions (Signed)
Heart Failure Nutrition Therapy For The Undernourished  This nutrition therapy will help you eat more calories and protein in addition to helping your heart. These suggestions can help you if you can't eat enough, have lost weight, or need extra calories and protein in your diet. This plan focuses on: Eating more calories.  Eating more calories can give you more energy, help you gain weight, and promote wound healing. Eating more protein. Protein can help you heal, give you energy, and build and repair muscle. Ask your registered dietitian nutritionist (RDN) how much protein is the right amount for you. Eating a low-sodium diet while increasing your calorie and protein intake. This will help control buildup of fluids around your heart, stomach, lungs, and legs. Too much sodium may make your blood pressure too high and put stress on your heart. You can achieve these goals by: Eating more calories and protein. Eating less than 2,000 milligrams of sodium per day. Reading food labels to keep track of how much sodium, protein, and calories are in the foods you eat. Limiting fluid intake if your doctor has asked you to follow a fluid restriction. Reading the Food Label: How Much Sodium Is too Much? The nutrition plan for heart failure usually limits the sodium that you get from food and beverages to 2,000 milligrams per day. Salt is the main source of sodium. Read the nutrition label to find out how much sodium is in 1 serving. Foods with more than 300 milligrams of sodium per serving may not fit into a reduced-sodium meal plan. Check serving sizes on the label. If you eat more than 1 serving, you will get more sodium than the amount listed. You can find protein and calories on the Nutrition Facts label too. Eating More Protein and Calories Eat at least 6 small meals throughout the day. If you become full quickly after you begin eating, you may benefit from small, frequent meals instead of 3 large  meals. Keep high-calorie and high-protein snacks available in your car or bag for when you get hungry. Add extra calories and protein when cooking meals. Cook with unsalted butter, margarine, or oils instead of calorie-free cooking spray. Use reduced-fat (2%) milk instead of fat-free (skim) milk. Ask your RDN if whole milk is recommended for you. Add milk powder to protein shakes, cereal, or casseroles. Sprinkle unsalted nuts and seeds into your cereal, stir-fry, or salad. Add 1 tablespoon mayonnaise, sugar, or honey to foods (adds 50 to 100 calories). Add calories and protein to your fruits and vegetables. Fruits are low in calories. Add peanut butter, yogurt, or cottage cheese to add calories and protein. Buy fruit cups canned in syrup instead of water or juice. Vegetables are also low in calories. Add butter, sour cream, margarine, or oil for extra calories. Potatoes, corn, and peas are higher in calories than nonstarchy vegetables. Avocados are rich in calories and healthy fat. Eat them alone or use them as a topping or spread. Limit foods that have little to no nutrition. Eat fewer calorie-free and low-calorie foods such as applesauce, Jell-O, and diet soft drinks. These foods will take up space in your stomach and may leave little room for higher-calorie foods and beverages.  When shopping, avoid products that say "low calorie" or "low fat." Drink high-calorie beverages. There are several liquid nutrition supplements available that also have less than 300 milligrams of sodium per serving. Drink them between meals as snacks.  Make your own supplement at home with milk or ice  cream.  Ask your RDN if protein powder would also be a good addition. Milk and juice are high in calories. Limit plain tea, coffee, and water. These have no calories.  Foods Recommended Food Group Foods Recommended  Grains Whole wheat bread with less than 80 milligrams of sodium per slice  Many cold cereals,  especially shredded wheat and granola Oats or cream of wheat made with milk (add butter or margarine, sugar, dried fruit, and nuts for more calories and fat) Quinoa Wheat germ (sprinkle on soups, baked goods, or cereal)  Vegetables Fresh and frozen vegetables without added sauces, salt, or sodium Homemade soups (salt free or low sodium) with dry milk powder or cream Potatoes, corn, and peas  Fruits Canned fruits (canned in heavy syrup) Dried fruits, such as raisins, cranberries, and prunes Avocados  Dairy (Milk and Milk Products) Milk or milk powder Soy milk Yogurt, including Greek yogurt Small amounts of natural, blocked cheeses or reduced-sodium cheese (Swiss, ricotta, and fresh mozzarella are lower in sodium than others) Regular or soft cream cheese and low-sodium cottage cheese  Protein Foods (Meat, Poultry, Fish, and Writer) [employer] and fish [place] bacon (check the Nutrition Facts label to make sure its not packaged in a sodium solution) Tuna canned or packed in oil Dried beans, peas, and legumes; edamame (fresh soybeans) Eggs Unsalted nuts or peanut butter  Desserts and Snacks Apple (with peanut butter); baked apple with sugar and butter             Granola bars Pudding made with reduced-fat (2%) milk or milk powder Smoothies  Custard Chocolate syrup added to milk or ice cream  Fats Tub or liquid margarine (trans fat-free) Butter (check with your doctor or RDN first) Unsaturated fat oils (canola, olive, corn, sunflower, safflower, peanut, vegetable, and soybean) Flaxseed (oil or ground)  Sodium-Free Condiments Fresh or dried herbs; pepper; vinegar; lemon juice or lime juice; salt-free seasoning mixes and marinades such as Mrs. Dash or McCormick's salt-free blend; simple salad dressings such as vinegar and oil; low-sodium ketchup. You can purchase salt-free barbecue sauce and many others on the Internet.  Ask your RDN.     Foods Not Recommended Food Group Foods Not  Recommended  Grains Breads or crackers topped with salt Cereals (hot/cold) with more than 300 milligrams sodium per serving Biscuits, cornbread, and other "quick" breads prepared with baking soda Prepackaged bread crumbs Self-rising flours  Vegetables Canned vegetables (unless they are salt free or low sodium) Frozen vegetables with high-sodium seasoning and sauces Sauerkraut and pickled vegetables Canned or dried soups (unless they are salt free or low  sodium) [place] fries and onion rings Broth-based soups  Fruits Dried fruits preserved with sodium-containing additives  Dairy (Milk and Milk Products) Buttermilk Processed cheeses such as Cheese Wiz, Velveeta, and Queso Feta cheese Shredded cheese (has more sodium than blocked cheeses) "Singles" cheese slices and string cheese  Protein Foods (Meat, Poultry, Fish, and Beans) Cured meats (bacon, ham, sausage, pepperoni, and hot dogs)  Canned meats (chili, Vienna sausage, sardines, and Spam) Smoked fish and meats Frozen meals with more than 600 milligrams of sodium Egg Beaters   Fats Salted butter or margarine  Condiments Salt, sea salt, kosher salt, onion salt, and garlic salt Seasoning mixes containing salt such as Lemon Pepper or Lawry's Bouillon cubes Catsup or ketchup Barbecue sauce and Worcestershire sauce Soy sauce Salsa, pickles, olives, relish Salad dressings: ranch, blue cheese, [place], and [place]  Alcohol Check with your doctor.   Heart Failure (  Undernourished) Sample 1-Day Menu View Nutrient Info Breakfast 1 cup oatmeal made with milk 1 tablespoon wheat germ (sprinkle on oatmeal) 1 cup (240 milliliters) reduced-fat (2%) milk 2 tablespoons chocolate syrup (add to milk) 1 banana 2 tablespoons peanut butter (for banana)  Morning Snack 1/4 cup raw almonds Cranberries (add to almonds) 1 cup (240 milliliters) oral nutritional supplement  Lunch 3 ounces grilled chicken breast 1 small potato 2 ounces cheese (to add to  potato) 2 tablespoons margarine (to add to potato) Croutons (add to salad) Walnuts (add to salad) Vinegar and oil dressing (add to salad) 1 cup (240 milliliters) juice  Afternoon Snack 10 tortilla chips Guacamole (avocado, lime juice, onion, and tomatoes, and pepper) 1 cup (240 milliliters) water or juice  Evening Meal 3 ounces herb-baked fish 1 cup homemade mashed potatoes with margarine (trans fat-free) and milk 1/2 cup creamed spinach 1 cup (240 milliliters) water or juice  Evening Snack 1 cup (240 milliliters) homemade milkshake 1 slice low-sodium [place] breast 1 slice whole wheat bread 1 slice cheese Mayonnaise  Daily Sum Nutrient Unit Value  Macronutrients  Energy kcal 3461  Energy kJ 14480  Protein g 151  Total lipid (fat) g 158  Carbohydrate, by difference g 381  Fiber, total dietary g 36  Sugars, total g 163  Minerals  Calcium, Ca mg 2598  Iron, Fe mg 32  Sodium, Na mg 2530  Vitamins  Vitamin C, total ascorbic acid mg 180  Vitamin A, IU IU 9009  Vitamin D IU 575  Lipids  Fatty acids, total saturated g 43  Fatty acids, total monounsaturated g 56  Fatty acids, total polyunsaturated g 48  Cholesterol mg 252     Heart Failure (Undernourished) Vegan Sample 1-Day Menu View Nutrient Info Breakfast 1 cup oatmeal made with: 1 cup soymilk fortified with calcium, vitamin B12, and vitamin D 2 tablespoons walnuts, unsalted 1 banana 1 cup orange juice with added calcium and vitamin D  Morning Snack  cup almond butter, unsalted 1 apple  cup granola  cup soy yogurt  Lunch 1 black bean burger, low sodium 1 hamburger bun 1 cup lettuce for salad with:  cup cucumbers, sliced  cup carrots, shredded 1 tablespoon cashews, unsalted 1 tablespoon sesame seed dressing, low sodium  cup pineapple  Afternoon Snack 1 pita, whole wheat  cup hummus  cup grapes  Evening Meal 1 cup cooked rice 1 cup red beans, cooked 1 cup corn, cooked  cup cucumber slices  cup  tomato, diced  Evening Snack Smoothie made with: 1 cup soymilk fortified with calcium, vitamin B12, and vitamin D 1 scoop soy protein powder, for smoothie 1 cup strawberries, for smoothie  Daily Sum Nutrient Unit Value  Macronutrients  Energy kcal 2831  Energy kJ 11850  Protein g 121  Total lipid (fat) g 85  Carbohydrate, by difference g 428  Fiber, total dietary g 71  Sugars, total g 154  Minerals  Calcium, Ca mg 2162  Iron, Fe mg 45  Sodium, Na mg 2054  Vitamins  Vitamin C, total ascorbic acid mg 296  Vitamin A, IU IU 17869  Vitamin D IU 318  Lipids  Fatty acids, total saturated g 9  Fatty acids, total monounsaturated g 36  Fatty acids, total polyunsaturated g 30  Cholesterol mg 0     Heart Failure (Undernourished) Vegetarian (Lacto-Ovo) Sample 1-Day Menu View Nutrient Info Breakfast 1 cup oatmeal made with: 1 cup 2% milk 1 slice toast, whole wheat topped with:  cup  avocado 1 cup orange juice with added calcium and vitamin D  Morning Snack  cup almonds, unsalted 2 tablespoons dried cranberries (add to almonds) 1 cup [place] yogurt, plain  Lunch Sandwich made with: 2 slices bread, whole wheat 2 tablespoons peanut butter, unsalted 1 tablespoon jam 4 carrot sticks with: 2 tablespoons hummus 1 banana  Afternoon Snack  cup granola  cup peanuts, unsalted 1 apple  Evening Meal 1 cup black bean soup, low sodium  cup tofu, cooked  cup cooked brown rice  cup broccoli, cooked  cup carrots, cooked  cup onions, cooked 2 tablespoons peanut oil  Evening Snack 5 crackers, whole wheat, low sodium 3 dried figs 1 cup 2% milk  Daily Sum Nutrient Unit Value  Macronutrients  Energy kcal 3020  Energy kJ 12636  Protein g 117  Total lipid (fat) g 133  Carbohydrate, by difference g 375  Fiber, total dietary g 58  Sugars, total g 148  Minerals  Calcium, Ca mg 2197  Iron, Fe mg 29  Sodium, Na mg 1434  Vitamins  Vitamin C, total ascorbic acid mg 174  Vitamin A, IU  IU 20295  Vitamin D IU 317  Lipids  Fatty acids, total saturated g 27  Fatty acids, total monounsaturated g 62  Fatty acids, total polyunsaturated g 34  Cholesterol mg 63    Copyright 2020  [employer]. All rights reserved

## 2022-06-19 NOTE — TOC Initial Note (Signed)
Transition of Care [facility]) - Initial/Assessment Note    Patient Details  Name: Danielle [name] MRN: [id number] Date of Birth: 07/26/1956  Transition [facility]) CM/SW Contact:    Alberteen Sam, LCSW Phone Number: 06/19/2022, 11:34 AM  Clinical Narrative:                  CSW met with patient at bedside to discuss SDOH food insecurities which patient denies needing resources for. She reports at baseline she is on 4L of O2 at home via [employer] however does not have potable concentrators. Requests CSW follow up with [employer] on this, CSW has [employer].   Patient uses [employer] and [name] as PCP. Patient reports she has a walker at home and no dme needs. Family to transport at dc. Uses bus transportation typically to get to and from apts.   TOC will continue to follow for needs.   Expected Discharge Plan: Home/Self Care Barriers to Discharge: Continued Medical Work up   Patient Goals and CMS Choice Patient states their goals for this hospitalization and ongoing recovery are:: to go home CMS Medicare.gov Compare Post Acute Care list provided to:: Patient Choice offered to / list presented to : Patient  Expected Discharge Plan and Services Expected Discharge Plan: Home/Self Care       Living arrangements for the past 2 months: Single Family Home                                      Prior Living Arrangements/Services Living arrangements for the past 2 months: Single Family Home Lives with:: Self                   Activities of Daily Living Home Assistive Devices/Equipment: Eyeglasses, Dentures (specify type) ADL Screening (condition at time of admission) Patient's cognitive ability adequate to safely complete daily activities?: Yes Is the patient deaf or have difficulty hearing?: No Does the patient have difficulty seeing, even when wearing glasses/contacts?: No Does the patient have difficulty concentrating, remembering, or making decisions?:  No Patient able to express need for assistance with ADLs?: Yes Does the patient have difficulty dressing or bathing?: No Independently performs ADLs?: No Communication: Independent Dressing (OT): Independent Grooming: Independent Feeding: Independent Bathing: Independent Toileting: Independent, Needs assistance Is this a change from baseline?: Pre-admission baseline In/Out Bed: Needs assistance Is this a change from baseline?: Pre-admission baseline Does the patient have difficulty walking or climbing stairs?: Yes Weakness of Legs: None Weakness of Arms/Hands: None  Permission Sought/Granted                  Emotional Assessment              Admission diagnosis:  Acute respiratory failure (HCC) [J96.00] Pleural effusion [J90] Acute on chronic respiratory failure with hypoxia ([place]) [J96.21] Acute on chronic congestive heart failure, unspecified heart failure type ([place]) [I50.9] Patient Active Problem List   Diagnosis Date Noted   Acute respiratory failure ([place]) 06/17/2022   Dyspnea 05/23/2022   SOB (shortness of breath) 05/22/2022   Chronic respiratory failure with hypoxia ([place])    Recurrent right pleural effusion 05/08/2022   Paroxysmal atrial fibrillation ([place]) 03/10/2022   Dyslipidemia 03/10/2022   Essential hypertension 03/10/2022   COPD, severe ([place]) 03/10/2022   Atrial fibrillation with RVR ([place]) 02/22/2022   Sore throat 02/02/2022   TIA (transient ischemic attack) 01/29/2022  Epistaxis 01/27/2022   Pleural effusion 01/26/2022   Known medical problems 01/26/2022   C. difficile colitis 11/27/2021   Abdominal pain 11/26/2021   HLD (hyperlipidemia) 11/26/2021   Chronic diastolic CHF (congestive heart failure) ([place]) 11/26/2021   Iron deficiency anemia 11/26/2021   Aspiration pneumonia of right lower lobe due to gastric secretions (HCC)    Ileus following gastrointestinal surgery ([place])    Hypomagnesemia    Anemia of chronic disease    Hyperphosphatemia  10/24/2021   Hypotension 11/17/2020   Ileostomy status ([place]) 11/17/2020   Acute shoulder pain due to trauma, left 11/17/2020   Syncope 11/17/2020   Chronic obstructive pulmonary disease ([place]) 08/01/2020   Cellulitis of left arm 01/20/2020   CKD (chronic kidney disease), stage IV (HCC) 01/20/2020   Hypokalemia 01/20/2020   Elevated troponin 01/20/2020   Sepsis ([place]) 01/20/2020   Acute on chronic combined systolic and diastolic CHF (congestive heart failure) ([place]) 01/20/2020   Pleural effusion due to CHF (congestive heart failure) ([place]) 11/06/2019   Diarrhea    GERD (gastroesophageal reflux disease) 10/29/2019   Depression 10/29/2019   Acute blood loss anemia 06/20/2019   Normocytic anemia 06/20/2019   GI bleeding 06/19/2019   Anemia of chronic kidney failure, stage 4 (severe) ([place]) 06/12/2019   Malignant neoplasm of lower third of esophagus ([place]) 05/01/2019   Acute on chronic respiratory failure with hypoxia and hypercapnia (HCC) 12/22/2018   Ileostomy dysfunction ([place]) 09/28/2018   Reflux esophagitis    Iron deficiency anemia secondary to blood loss (chronic)    SBO (small bowel obstruction) ([place]) 06/29/2018   Atrial flutter ([place]) 06/09/2018   Lymphedema 06/09/2018   Acute kidney injury superimposed on CKD ([place]) 06/04/2018   Severe protein-calorie malnutrition ([place]) 06/04/2018   Blood in stool    Focal (segmental) acute (reversible) ischemia of large intestine (HCC)    Ulceration of intestine    Diverticulosis of large intestine without diverticulitis    Abnormal CT scan, colon    Colitis    Malnutrition of moderate degree 04/23/2018   Palliative care by specialist    DNR (do not resuscitate) discussion    Weakness    Acute on chronic respiratory failure with hypoxia ([place]) 04/21/2018   Hyponatremia 02/10/2017   Syncope and collapse 02/09/2017   Lung nodule, solitary 07/25/2016   Malignant neoplasm of upper lobe of right lung ([place]) 07/25/2016   Carcinoma of overlapping  sites of left breast in female, estrogen receptor positive ([place]) 06/22/2016   Multiple lung nodules 06/22/2016   Lesion of right lung    Breast cancer in female [facility]) 02/21/2016   Moderate COPD (chronic obstructive pulmonary disease) ([place]) 08/24/2014   PCP:  [name], [facility]:   [place], [place], [place] - [address] [place] [address] Phone: [phone] Fax: [place], [place] - [address] [place] [place] [address] Phone: [phone] Fax: [phone]     Social Determinants of Health (SDOH) Interventions    Readmission Risk Interventions    05/09/2022    3:11 PM 01/29/2022   11:36 AM 11/29/2021   10:49 AM  Readmission Risk Prevention Plan  Transportation Screening Complete Complete Complete  Medication Review (RN Care Manager) Complete Complete Complete  PCP or Specialist appointment within 3-5 days of discharge  Complete Complete  HRI or Home Care Consult Patient refused  Complete  SW Recovery Care/Counseling Consult Complete Complete  Complete  Palliative Care Screening Not Applicable Not Applicable Not Applicable  Skilled Nursing Facility Not Applicable Not Applicable Not Applicable

## 2022-06-19 NOTE — Progress Notes (Signed)
Met with patient on behalf of heart failure clinic. Informed patient of hospital follow up appointment scheduled for 06/26/22 at 10:30 AM.  Patient states she does not know why her swelling gets worse suddenly.We discussed the importance of maintaining low sodium diet and what that could look like. We discussed following fluid restriction. Patient states she tries to watch fluid and sodium intake carefully. Patient states it is difficult to get to appointments without a portable oxygen tank but that she has been unsuccessful in attempts to receive the portable tank. Will notify Darylene Price, [place], but this issue is currently being addressed per social worker note.  Patient states she plans to be at her appointment on 9/12 and was able to verbalize that she will call for heart failure related symptoms or concerns in the mean time. Georg Ruddle, RN

## 2022-06-20 ENCOUNTER — Other Ambulatory Visit: Payer: Self-pay

## 2022-06-20 ENCOUNTER — Telehealth: Payer: Self-pay | Admitting: Internal Medicine

## 2022-06-20 ENCOUNTER — Inpatient Hospital Stay: Payer: Medicare Other

## 2022-06-20 DIAGNOSIS — D631 Anemia in chronic kidney disease: Secondary | ICD-10-CM

## 2022-06-20 DIAGNOSIS — J154 Pneumonia due to other streptococci: Secondary | ICD-10-CM

## 2022-06-20 DIAGNOSIS — J9621 Acute and chronic respiratory failure with hypoxia: Secondary | ICD-10-CM

## 2022-06-20 LAB — BASIC METABOLIC PANEL
Anion gap: 7 (ref 5–15)
BUN: 33 mg/dL — ABNORMAL HIGH (ref 8–23)
CO2: 27 mmol/L (ref 22–32)
Calcium: 8.6 mg/dL — ABNORMAL LOW (ref 8.9–10.3)
Chloride: 106 mmol/L (ref 98–111)
Creatinine, Ser: 1.63 mg/dL — ABNORMAL HIGH (ref 0.44–1.00)
GFR, Estimated: 34 mL/min — ABNORMAL LOW (ref >60)
Glucose, Bld: 142 mg/dL — ABNORMAL HIGH (ref 70–99)
Potassium: 3.6 mmol/L (ref 3.5–5.1)
Sodium: 140 mmol/L (ref 135–145)

## 2022-06-20 LAB — CBC
HCT: 25.1 % — ABNORMAL LOW (ref 36.0–46.0)
Hemoglobin: 8.3 g/dL — ABNORMAL LOW (ref 12.0–15.0)
MCH: 31.3 pg (ref 26.0–34.0)
MCHC: 33.1 g/dL (ref 30.0–36.0)
MCV: 94.7 fL (ref 80.0–100.0)
Platelets: 207 10*3/uL (ref 150–400)
RBC: 2.65 MIL/uL — ABNORMAL LOW (ref 3.87–5.11)
RDW: 14.2 % (ref 11.5–15.5)
WBC: 6.2 10*3/uL (ref 4.0–10.5)
nRBC: 0 % (ref 0.0–0.2)

## 2022-06-20 LAB — PROTEIN, BODY FLUID (OTHER): Total Protein, Body Fluid Other: 2.3 g/dL

## 2022-06-20 LAB — ACID FAST SMEAR (AFB, MYCOBACTERIA): Acid Fast Smear: NEGATIVE

## 2022-06-20 LAB — BRAIN NATRIURETIC PEPTIDE: B Natriuretic Peptide: 3240.6 pg/mL — ABNORMAL HIGH (ref 0.0–100.0)

## 2022-06-20 LAB — CYTOLOGY - NON PAP

## 2022-06-20 MED ORDER — SIMETHICONE 80 MG PO CHEW
80.0000 mg | CHEWABLE_TABLET | Freq: Once | ORAL | Status: AC
Start: 2022-06-20 — End: 2022-06-20
  Administered 2022-06-20: 80 mg via ORAL
  Filled 2022-06-20: qty 1

## 2022-06-20 MED ORDER — HYDRALAZINE HCL 10 MG PO TABS
10.0000 mg | ORAL_TABLET | Freq: Three times a day (TID) | ORAL | Status: DC
  Administered 2022-06-20 – 2022-06-21 (×3): 10 mg via ORAL
  Filled 2022-06-20 (×3): qty 1

## 2022-06-20 MED ORDER — TRAMADOL HCL 50 MG PO TABS
50.0000 mg | ORAL_TABLET | Freq: Two times a day (BID) | ORAL | Status: DC | PRN
  Administered 2022-06-20: 50 mg via ORAL
  Filled 2022-06-20: qty 1

## 2022-06-20 MED ORDER — SIMETHICONE 80 MG PO CHEW
80.0000 mg | CHEWABLE_TABLET | Freq: Once | ORAL | Status: AC
  Administered 2022-06-20: 80 mg via ORAL
  Filled 2022-06-20: qty 1

## 2022-06-20 MED ORDER — POTASSIUM CHLORIDE CRYS ER 20 MEQ PO TBCR
20.0000 meq | EXTENDED_RELEASE_TABLET | Freq: Once | ORAL | Status: AC
Start: 2022-06-20 — End: 2022-06-20
  Administered 2022-06-20: 20 meq via ORAL
  Filled 2022-06-20: qty 1

## 2022-06-20 MED ORDER — ISOSORBIDE DINITRATE 10 MG PO TABS
5.0000 mg | ORAL_TABLET | Freq: Three times a day (TID) | ORAL | Status: DC
  Administered 2022-06-20 – 2022-06-21 (×3): 5 mg via ORAL
  Filled 2022-06-20 (×4): qty 0.5

## 2022-06-20 NOTE — Assessment & Plan Note (Signed)
Initially requiring BiPAP, not transition back to baseline oxygen requirement of 4 L. Most likely multifactorial with recurrent pleural effusion, strep pneumo pneumonia and acute on chronic combined heart failure. -Current continue with supplemental oxygen.

## 2022-06-20 NOTE — Care Management Important Message (Signed)
Important Message  Patient Details  Name: Danielle [name] MRN: [id number] Date of Birth: [birth year]   Medicare Important Message Given:  N/A - LOS <3 / Initial given by admissions     Dannette Halima 06/20/2022, 8:21 AM

## 2022-06-20 NOTE — Assessment & Plan Note (Signed)
Elevated BNP at 3240.  Cardiology is on board.  She received IV diuresis. Clinically appears euvolemic now. -Now switched back to home torsemide. -Continue to monitor -Continue with metoprolol

## 2022-06-20 NOTE — Assessment & Plan Note (Signed)
Estimated body mass index is 18.6 kg/m as calculated from the following:   Height as of this encounter: 5\' 5"  (1.651 m).   Weight as of this encounter: 50.7 kg.   -Dietitian consult

## 2022-06-20 NOTE — Telephone Encounter (Signed)
Pt called and stated she is hospitalized. She would like a call back to reschedule her appts based on what Dr. B thinks about her hospitalization and when to correlate a follow up

## 2022-06-20 NOTE — Assessment & Plan Note (Signed)
Continue PPI ?

## 2022-06-20 NOTE — Progress Notes (Signed)
Progress Note   Patient: Danielle [name] CWC:[id number] DOB: 1956/10/06 DOA: 06/16/2022     3 DOS: the patient was seen and examined on 06/20/2022   Brief hospital course: Taken from prior notes.  66 years old female with PMH significant for lung Cancer, CHF combined systolic and diastolic, COPD, recurrent right pleural effusion, chronic hypoxic respiratory failure on 4 L of supplemental oxygen at baseline, Esophageal CA, paroxysmal A-fib, GERD, CKD stage IIIb, hyperlipidemia, hypertension presented in the ED with complaints of worsening shortness of breath for 1 week. Patient also reports having pleuritic chest pain associated with palpitations and dizziness.  In the ED Patient was found to be having acute on chronic hypoxic respiratory failure secondary to fluid overload requiring BiPAP support and later transitioned to 6 L of oxygen.  Patient was also given IV diuresis.  CTA was negative for PE but did show moderate to large right pleural effusion which was increased from her most recent CT abdomen done in July 2023.  There was also concerning marked bronchial thickening particularly in the lower lobes with areas of mucous plugging and mucoid impaction. Clustered nodular opacities in the superior segment of the left lower lobe which were new from prior CT which can be infectious or neoplastic they were recommending repeat CT or PET scan in 3 months. Areas of masslike opacity in the right upper lobe are stable. Chronically dilated main pulmonary artery consistent with pulmonary arterial hypertension. Strep pneumo antigen was positive. She was initially also found to be in A-fib with RVR requiring Cardizem infusion and cardiology was consulted. Patient also underwent IR guided right thoracentesis with removal of 1.2 L of clear fluid.  Cultures negative.  9/6: Remains stable from respiratory standpoint, saturating 100% on her baseline oxygen requirement of 4 L. Abdominal x-ray was obtained with  complaint of periumbilical pain, which did not show any obvious obstruction.  Having bowel movements.  Cardiology switched her back to home torsemide. Remained in sinus rhythm.    Assessment and Plan: * Acute on chronic respiratory failure with hypoxia (HCC) Initially requiring BiPAP, not transition back to baseline oxygen requirement of 4 L. Most likely multifactorial with recurrent pleural effusion, strep pneumo pneumonia and acute on chronic combined heart failure. -Current continue with supplemental oxygen.  Streptococcal pneumonia ([place]) Strep pneumo urinary antigen positive, Legionella negative. - Continue with ceftriaxone and Zithromax -Continue with pulmonary toilet  Recurrent right pleural effusion S/p thoracentesis with removal of 1.2 L of clear fluid.  Cultures negative. -Continue to monitor  Acute on chronic combined systolic and diastolic CHF (congestive heart failure) (HCC) Elevated BNP at 3240.  Cardiology is on board.  She received IV diuresis. Clinically appears euvolemic now. -Now switched back to home torsemide. -Continue to monitor -Continue with metoprolol  Paroxysmal atrial fibrillation (HCC) With RVR on presentation requiring Cardizem infusion.  Now spontaneously back to sinus rhythm and remained in sinus rhythm since then. -Continue with home amiodarone, metoprolol and Eliquis  Severe protein-calorie malnutrition (HCC) Estimated body mass index is 18.6 kg/m as calculated from the following:   Height as of this encounter: 5\' 5"  (1.651 m).   Weight as of this encounter: 50.7 kg.   -Dietitian consult  Essential hypertension Currently blood pressure within goal. -Continue current medications  GERD (gastroesophageal reflux disease) - Continue PPI  CKD (chronic kidney disease), stage IV (HCC) Seems stable and at baseline. Baseline creatinine of 1.6-1.7        Subjective: Patient was complaining of some periumbilical nonspecific abdominal  pain.   No relationship with food.  No nausea or vomiting.  Continued to have multiple bowel movements daily which seems chronic.  Physical Exam: Vitals:   06/20/22 0421 06/20/22 0740 06/20/22 1147 06/20/22 1556  BP: 131/80 (!) 143/88 111/68 100/65  Pulse: 64 63 66 68  Resp: 16 20 20 18   Temp: 97.9 F (36.6 C) 98 F (36.7 C) 98 F (36.7 C) 98.1 F (36.7 C)  TempSrc: Oral Axillary Axillary Axillary  SpO2: 100% 100% 99% 96%  Weight:      Height:       General.  Malnourished lady, in no acute distress. Pulmonary.  Lungs clear bilaterally, normal respiratory effort. CV.  Regular rate and rhythm, no JVD, rub or murmur. Abdomen.  Soft, nontender, nondistended, BS positive. CNS.  Alert and oriented .  No focal neurologic deficit. Extremities.  No edema, no cyanosis, pulses intact and symmetrical. Psychiatry.  Judgment and insight appears normal.  Data Reviewed: Prior data reviewed  Family Communication:   Disposition: Status is: Inpatient Remains inpatient appropriate because: Severity of illness   Planned Discharge Destination: Home  DVT prophylaxis.  Eliquis Time spent: 50 minutes  This record has been created using Systems analyst. Errors have been sought and corrected,but may not always be located. Such creation errors do not reflect on the standard of care.  Author: Lorella Nimrod, MD 06/20/2022 5:28 PM  For on call review [email or web address].

## 2022-06-20 NOTE — Assessment & Plan Note (Signed)
Seems stable and at baseline. Baseline creatinine of 1.6-1.7

## 2022-06-20 NOTE — Assessment & Plan Note (Signed)
Strep pneumo urinary antigen positive, Legionella negative. - Continue with ceftriaxone and Zithromax -Continue with pulmonary toilet

## 2022-06-20 NOTE — Hospital Course (Addendum)
Taken from prior notes.  66 years old female with PMH significant for lung Cancer, CHF combined systolic and diastolic, COPD, recurrent right pleural effusion, chronic hypoxic respiratory failure on 4 L of supplemental oxygen at baseline, Esophageal CA, paroxysmal A-fib, GERD, CKD stage IIIb, hyperlipidemia, hypertension presented in the ED with complaints of worsening shortness of breath for 1 week. Patient also reports having pleuritic chest pain associated with palpitations and dizziness.  In the ED Patient was found to be having acute on chronic hypoxic respiratory failure secondary to fluid overload requiring BiPAP support and later transitioned to 6 L of oxygen.  Patient was also given IV diuresis.  CTA was negative for PE but did show moderate to large right pleural effusion which was increased from her most recent CT abdomen done in July 2023.  There was also concerning marked bronchial thickening particularly in the lower lobes with areas of mucous plugging and mucoid impaction. Clustered nodular opacities in the superior segment of the left lower lobe which were new from prior CT which can be infectious or neoplastic they were recommending repeat CT or PET scan in 3 months. Areas of masslike opacity in the right upper lobe are stable. Chronically dilated main pulmonary artery consistent with pulmonary arterial hypertension. Strep pneumo antigen was positive. She was initially also found to be in A-fib with RVR requiring Cardizem infusion and cardiology was consulted. Patient also underwent IR guided right thoracentesis with removal of 1.2 L of clear fluid.  Cultures negative.  9/6: Remains stable from respiratory standpoint, saturating 100% on her baseline oxygen requirement of 4 L. Abdominal x-ray was obtained with complaint of periumbilical pain, which did not show any obvious obstruction.  Having bowel movements.  Cardiology switched her back to home torsemide. Remained in sinus  rhythm.  9/7: Patient completed a course of antibiotics for strep pneumonia.  Remained stable on her baseline oxygen requirement of 4 L.  Cardiology suggested decreasing the dose of amiodarone to 200 mg daily instead of twice daily. Patient continued to have very nonspecific periumbilical pain, no relationship with food.  Feels more like gas.  Having regular bowel movements with no recent change.  At baseline she have multiple soft bowel movements daily.  She was started on Mylanta to be used as needed. Renal functions stable around baseline.  Patient will continue on current medications and need to have a close follow-up with her providers for further recommendations.

## 2022-06-20 NOTE — Progress Notes (Addendum)
[place] NOTE       Patient ID: Danielle [name] MRN: [id number] DOB/AGE: 1956/01/07 66 y.o.  Admit date: 06/16/2022 Referring Physician Dwyane Dee  Primary Physician [name], PA-C Primary Cardiologist [name] Reason for Consultation SOB   HPI: Danielle [name] is a 35yoF with a PMH of advanced COPD on 3L O2, paroxysmal atrial fibrillation/flutter (dose reduced eliquis held due to recent epistaxis), HFrEF (LVEF 35-40%, g2dd, mod-sev TR, mod AS, mod MS 01/2022), recurrent Right pleural effusion, CKD (baseline Cr. 2-2.5), remote history of lung cancer s/p radiation, esophageal cancer s/p radiation, MGUS, who presented to [facility] ED 06/16/2022 with shortness of breath x1 week, pleuritic chest discomfort with associated palpitations and dizziness.  She was initially requiring BiPAP, has now been weaned to 6 L by nasal cannula.  Urinary strep pneumo antigen positive, she was started on empiric ceftriaxone and azithromycin.  Cardiology is consulted for assistance with her A-fib with RVR and heart failure.  Interval History: -having "gas pain" vs abdominal pain in the RUQ and epigastrium and discomfort in her throat with swallowing both solids and liquids -no chest pain, breathing has improved. No palpitations.  -remains in sinus rhythm on telemetry   Review of systems complete and found to be negative unless listed above     Past Medical History:  Diagnosis Date   Acute respiratory failure due to COVID-19 ([place]) 09/27/2019   Anemia    Anxiety    Aortic atherosclerosis ([place])    Aortic valve stenosis 02/10/2018   a.) TTE 02/10/2018: EF 55-60%: mild AS with MPG of 12 mmHg. b.) TTE 04/21/2018: EF 35-40%; mild AS with MPG 8 mmHg. c.) TTE 11/07/2019: EF 50-55%; mild AS with MPG 10 mmHg.   Arthritis    Atrial fibrillation (HCC)    a.) CHA2DS2-VASc = 4 (age, sex, HTN, aortic plaque). b.) Rate/rhythm maintained on oral carvedilol; chronically anticoagulated on dose reduced  apixaban. c.)  Attempted deployment of LAA occlusive device on 01/11/2021; parameters failed and procedure aborted.   Breast cancer, left ([place]) 2000   a.) T2N1M0; ER/PR (+) --> Tx'd with total mastectomy, LN resection, XRT, and chemotherapy   Cancer of right lung ([place]) 07/30/2016   a.) adenocarcinoma; ALK, ROS1, PDL1, BRAF, EGFR all negative.   Chronic diastolic CHF (congestive heart failure) ([place]) 11/26/2021   CKD (chronic kidney disease), stage IV (HCC)    COPD (chronic obstructive pulmonary disease) (HCC)    Dependence on supplemental oxygen    Depression    Diastolic dysfunction 07/31/5101   a.) TTE 02/10/2017: EF 55-60%; G2DD. b.) TTE 04/21/2018: EF 35-40%; mild LA dilation; mod MV regurgitation. c.) TTE 11/07/2019: EF 50-55%; G1DD.   DOE (dyspnea on exertion)    GERD (gastroesophageal reflux disease)    Heart murmur    History of 2019 novel coronavirus disease (COVID-19) 10/14/2019   HLD (hyperlipidemia)    Hypertension    Long term current use of anticoagulant    a.) apixaban   Lymphedema    Personal history of chemotherapy    Personal history of radiation therapy    Respiratory tract infection due to COVID-19 virus 09/27/2019   SBO (small bowel obstruction) ([place]) 11/05/2020   Vitamin D deficiency     Past Surgical History:  Procedure Laterality Date   Breast Biospy Left    [facility]   BREAST SURGERY     CARDIOVERSION N/A 02/23/2022   Procedure: CARDIOVERSION;  Surgeon: Corey Skains, MD;  Location: [facility] ORS;  Service: Cardiovascular;  Laterality: N/A;  COLONOSCOPY N/A 04/30/2018   Procedure: COLONOSCOPY;  Surgeon: Virgel Manifold, MD;  Location: [facility] ENDOSCOPY;  Service: Endoscopy;  Laterality: N/A;   COLONOSCOPY N/A 07/22/2018   Procedure: COLONOSCOPY;  Surgeon: Virgel Manifold, MD;  Location: [facility] ENDOSCOPY;  Service: Endoscopy;  Laterality: N/A;   COLONOSCOPY WITH PROPOFOL N/A 09/21/2021   Procedure: COLONOSCOPY WITH PROPOFOL;  Surgeon: Benjamine Sprague, DO;   Location: [facility] ENDOSCOPY;  Service: General;  Laterality: N/A;   COLONOSCOPY WITH PROPOFOL N/A 03/11/2022   Procedure: COLONOSCOPY WITH PROPOFOL;  Surgeon: Annamaria Helling, DO;  Location: [facility] ENDOSCOPY;  Service: Gastroenterology;  Laterality: N/A;   DILATION AND CURETTAGE OF UTERUS     ELECTROMAGNETIC NAVIGATION BROCHOSCOPY Right 04/11/2016   Procedure: ELECTROMAGNETIC NAVIGATION BRONCHOSCOPY;  Surgeon: Vilinda Boehringer, MD;  Location: [facility] ORS;  Service: Cardiopulmonary;  Laterality: Right;   ESOPHAGOGASTRODUODENOSCOPY N/A 07/22/2018   Procedure: ESOPHAGOGASTRODUODENOSCOPY (EGD);  Surgeon: Virgel Manifold, MD;  Location: [facility] ENDOSCOPY;  Service: Endoscopy;  Laterality: N/A;   ESOPHAGOGASTRODUODENOSCOPY (EGD) WITH PROPOFOL N/A 05/07/2018   Procedure: ESOPHAGOGASTRODUODENOSCOPY (EGD) WITH PROPOFOL;  Surgeon: Lucilla Lame, MD;  Location: [facility] ENDOSCOPY;  Service: Endoscopy;  Laterality: N/A;   ESOPHAGOGASTRODUODENOSCOPY (EGD) WITH PROPOFOL N/A 04/24/2019   Procedure: ESOPHAGOGASTRODUODENOSCOPY (EGD) WITH PROPOFOL;  Surgeon: Jonathon Bellows, MD;  Location: [facility] ENDOSCOPY;  Service: Gastroenterology;  Laterality: N/A;   ESOPHAGOGASTRODUODENOSCOPY (EGD) WITH PROPOFOL N/A 01/12/2020   Procedure: ESOPHAGOGASTRODUODENOSCOPY (EGD) WITH PROPOFOL;  Surgeon: Jonathon Bellows, MD;  Location: [facility] ENDOSCOPY;  Service: Gastroenterology;  Laterality: N/A;   ESOPHAGOGASTRODUODENOSCOPY (EGD) WITH PROPOFOL N/A 04/28/2020   Procedure: ESOPHAGOGASTRODUODENOSCOPY (EGD) WITH PROPOFOL;  Surgeon: Jonathon Bellows, MD;  Location: [facility] ENDOSCOPY;  Service: Gastroenterology;  Laterality: N/A;   ESOPHAGOGASTRODUODENOSCOPY (EGD) WITH PROPOFOL N/A 03/11/2022   Procedure: ESOPHAGOGASTRODUODENOSCOPY (EGD) WITH PROPOFOL;  Surgeon: Annamaria Helling, DO;  Location: [facility] ENDOSCOPY;  Service: Gastroenterology;  Laterality: N/A;   EUS N/A 05/07/2019   Procedure: FULL UPPER ENDOSCOPIC ULTRASOUND (EUS) RADIAL;  Surgeon: Jola Schmidt, MD;   Location: [facility] ENDOSCOPY;  Service: Endoscopy;  Laterality: N/A;   ILEOSCOPY N/A 07/22/2018   Procedure: ILEOSCOPY THROUGH STOMA;  Surgeon: Virgel Manifold, MD;  Location: [facility] ENDOSCOPY;  Service: Endoscopy;  Laterality: N/A;   ILEOSTOMY     ILEOSTOMY N/A 09/08/2018   Procedure: ILEOSTOMY REVISION POSSIBLE CREATION;  Surgeon: Herbert Pun, MD;  Location: [facility] ORS;  Service: General;  Laterality: N/A;   ILEOSTOMY CLOSURE N/A 08/15/2018   Procedure: DILATION OF ILEOSTOMY STRICTURE;  Surgeon: Herbert Pun, MD;  Location: [facility] ORS;  Service: General;  Laterality: N/A;   LAPAROTOMY Right 05/04/2018   Procedure: EXPLORATORY LAPAROTOMY right colectomy right and left ostomy;  Surgeon: Herbert Pun, MD;  Location: [facility] ORS;  Service: General;  Laterality: Right;   LEFT ATRIAL APPENDAGE OCCLUSION N/A 01/11/2021   Procedure: LEFT ATRIAL APPENDAGE OCCLUSION ([place]); ABORTED PROCEDURE WITHOUT DEVICE BEING IMPLANTED; Location: [facility]; Surgeon: Mylinda Latina, MD   LUNG BIOPSY     MASTECTOMY Left    2000, [place]   ROTATOR CUFF REPAIR Right    [place] TAKEDOWN N/A 10/23/2021   Procedure: XI ROBOTIC ASSISTED ILEOSTOMY TAKEDOWN;  Surgeon: Herbert Pun, MD;  Location: [facility] ORS;  Service: General;  Laterality: N/A;  180 minutes for the surgery part please    Medications Prior to Admission  Medication Sig Dispense Refill Last Dose   amiodarone (PACERONE) 200 MG tablet Take 1 tablet (200 mg total) by mouth 2 (two) times daily. 60 tablet 0 Past Week  amLODipine (NORVASC) 5 MG tablet Take 5 mg by mouth daily.   Past Week   apixaban (ELIQUIS) 2.5 MG TABS tablet Take 1 tablet (2.5 mg total) by mouth 2 (two) times daily. 60 tablet 0 Past Week   atorvastatin (LIPITOR) 40 MG tablet Take 1 tablet (40 mg total) by mouth daily. 30 tablet 2 Past Week   calcium-vitamin D (OSCAL WITH D) 500-5 MG-MCG tablet Take 1 tablet by mouth daily.   Past Week    citalopram (CELEXA) 20 MG tablet Take 1 tablet (20 mg total) by mouth daily. (Patient taking differently: Take 40 mg by mouth daily.) 30 tablet 2 Past Week   ferrous sulfate 325 (65 FE) MG tablet Take 325 mg by mouth 2 (two) times daily with a meal.   Past Week   Fluticasone-Umeclidin-Vilant (TRELEGY ELLIPTA) 100-62.5-25 MCG/ACT AEPB Inhale 1 puff into the lungs daily.   Past Week   hydrALAZINE (APRESOLINE) 10 MG tablet Take 1 tablet (10 mg total) by mouth 3 (three) times daily. 90 tablet 0 Past Week   isosorbide dinitrate (ISORDIL) 5 MG tablet Take 1 tablet (5 mg total) by mouth 3 (three) times daily. 90 tablet 0 Past Week   metoprolol tartrate (LOPRESSOR) 25 MG tablet Take 1 tablet (25 mg total) by mouth 2 (two) times daily. 60 tablet 2 Past Week   Multiple Vitamin (MULTIVITAMIN WITH MINERALS) TABS tablet Take 1 tablet by mouth daily.   Past Week   OXYGEN Inhale 4 L into the lungs daily.   Past Week   pantoprazole (PROTONIX) 40 MG tablet Take 1 tablet (40 mg total) by mouth 2 (two) times daily before a meal. 60 tablet 2 Past Week   sodium bicarbonate 650 MG tablet Take 1,300 mg by mouth daily.   Past Week   sucralfate (CARAFATE) 1 GM/10ML suspension Take 0.5 g by mouth 2 (two) times daily.   Past Week   Torsemide 40 MG TABS Take 20 mg by mouth daily. Hold this medication until you see your cardiologist   Past Week   acetaminophen (TYLENOL) 325 MG tablet Take 2 tablets (650 mg total) by mouth every 6 (six) hours as needed for mild pain (or Fever >/= 101). (Patient taking differently: Take 650 mg by mouth every 4 (four) hours as needed for moderate pain.)   prn at prn   albuterol (PROVENTIL HFA;VENTOLIN HFA) 108 (90 Base) MCG/ACT inhaler Inhale 2 puffs into the lungs every 6 (six) hours as needed for wheezing or shortness of breath. 1 Inhaler 2 prn at prn   calcium carbonate (TUMS - DOSED IN MG ELEMENTAL CALCIUM) 500 MG chewable tablet Chew 0.5 tablets by mouth daily as needed.   prn at prn    loperamide (IMODIUM) 2 MG capsule Take 2 mg by mouth daily as needed for diarrhea or loose stools.   prn at prn   zolpidem (AMBIEN) 5 MG tablet Take 5 mg by mouth at bedtime as needed for sleep.   prn   Social History   Socioeconomic History   Marital status: Divorced    Spouse name: Not on file   Number of children: 3   Years of education: Not on file   Highest education level: Not on file  Occupational History   Occupation: Retired   Tobacco Use   Smoking status: Former    Packs/day: 0.50    Years: 20.00    Total pack years: 10.00    Types: Cigarettes    Quit date: 07/02/2012  Years since quitting: 9.9   Smokeless tobacco: Current    Types: Snuff  Vaping Use   Vaping Use: Never used  Substance and Sexual Activity   Alcohol use: Yes    Alcohol/week: 3.0 standard drinks of alcohol    Types: 3 Cans of beer per week    Comment: Occasionally beer   Drug use: No   Sexual activity: Not Currently  Other Topics Concern   Not on file  Social History Narrative   Not on file   Social Determinants of Health   Financial Resource Strain: High Risk (07/05/2018)   Overall Financial Resource Strain (CARDIA)    Difficulty of Paying Living Expenses: Hard  Food Insecurity: No Food Insecurity (06/19/2022)   Hunger Vital Sign    Worried About Running Out of Food in the Last Year: Never true    Ran Out of Food in the Last Year: Never true  Transportation Needs: No Transportation Needs (02/28/2022)   PRAPARE - Hydrologist (Medical): No    Lack of Transportation (Non-Medical): No  Physical Activity: Insufficiently Active (07/05/2018)   Exercise Vital Sign    Days of Exercise per Week: 2 days    Minutes of Exercise per Session: 30 min  Stress: No Stress Concern Present (07/05/2018)   [place]    Feeling of Stress : Not at all  Social Connections: Somewhat Isolated (07/05/2018)   Social  Connection and Isolation Panel [NHANES]    Frequency of Communication with Friends and Family: More than three times a week    Frequency of Social Gatherings with Friends and Family: More than three times a week    Attends Religious Services: 1 to 4 times per year    Active Member of [employer] or Organizations: No    Attends Archivist Meetings: Never    Marital Status: Divorced  Human resources officer Violence: Not At Risk (07/05/2018)   Humiliation, Afraid, Rape, and Kick questionnaire    Fear of Current or Ex-Partner: No    Emotionally Abused: No    Physically Abused: No    Sexually Abused: No    Family History  Problem Relation Age of Onset   Breast cancer Mother 44   Cancer Mother        Breast    Cirrhosis Father    Breast cancer Paternal Aunt 66   Cancer Maternal Aunt        Breast     PHYSICAL EXAM General: Elderly and thin black female, in no acute distress.  Sitting upright eating breakfast HEENT:  Normocephalic and atraumatic. Neck:  No JVD.  Lungs: Normal respiratory effort on 4 L by nasal cannula.  Decreased breath sounds bilaterally without appreciable crackles or wheezes.   Heart: HRRR . Normal S1 and S2.  3/6 systolic murmur best heard at the RUSB Abdomen: soft, tender to palpation in the RUQ and epigastrium without rebound or guarding.  Msk: Normal strength and tone for age. Extremities: Warm and well perfused. No clubbing, cyanosis.  No peripheral edema.  Neuro: Alert and oriented X 3. Psych:  Answers questions appropriately.   Labs: Basic Metabolic Panel: Recent Labs    06/18/22 0923 06/19/22 0526 06/20/22 0619  NA 142 142 140  K 4.1 3.8 3.6  CL 108 106 106  CO2 _0 GLUCOSE 105* 131* 142*  BUN 28* 33* 33*  CREATININE 1.61* 1.72* 1.63*  CALCIUM 8.9 8.9 8.6*  MG 1.5* 2.2  --   PHOS 3.0 2.5  --     Liver Function Tests: No results for input(s): "AST", "ALT", "ALKPHOS", "BILITOT", "PROT", "ALBUMIN" in the last 72 hours.  No results for  input(s): "LIPASE", "AMYLASE" in the last 72 hours. CBC: Recent Labs    06/19/22 0526 06/20/22 0619  WBC 5.6 6.2  HGB 8.0* 8.3*  HCT 24.6* 25.1*  MCV 95.0 94.7  PLT 216 207    Cardiac Enzymes: No results for input(s): "CKTOTAL", "CKMB", "CKMBINDEX", "TROPONINIHS" in the last 72 hours.  BNP: Invalid input(s): "POCBNP" D-Dimer: No results for input(s): "DDIMER" in the last 72 hours.  Hemoglobin A1C: No results for input(s): "HGBA1C" in the last 72 hours.  Fasting Lipid Panel: No results for input(s): "CHOL", "HDL", "LDLCALC", "TRIG", "CHOLHDL", "LDLDIRECT" in the last 72 hours. Thyroid Function Tests: No results for input(s): "TSH", "T4TOTAL", "T3FREE", "THYROIDAB" in the last 72 hours.  Invalid input(s): "FREET3"  Anemia Panel: No results for input(s): "VITAMINB12", "FOLATE", "FERRITIN", "TIBC", "IRON", "RETICCTPCT" in the last 72 hours.  US THORACENTESIS ASP PLEURAL SPACE W/IMG GUIDE  Result Date: 06/19/2022 INDICATION: Patient with history of known AFib, CKD, CHF and history of adenocarcinoma right lung status post radiation. Patient has recurrent right pleural effusion. Request for diagnostic and therapeutic right thoracentesis. EXAM: ULTRASOUND GUIDED DIAGNOSTIC AND THERAPEUTIC RIGHT THORACENTESIS MEDICATIONS: 15 mL 1% lidocaine and topical lidocaine spray COMPLICATIONS: None immediate. PROCEDURE: An ultrasound guided thoracentesis was thoroughly discussed with the patient and questions answered. The benefits, risks, alternatives and complications were also discussed. The patient understands and wishes to proceed with the procedure. Written consent was obtained. Ultrasound was performed to localize and mark an adequate pocket of fluid in the right chest. The area was then prepped and draped in the normal sterile fashion. 1% Lidocaine was used for local anesthesia. Under ultrasound guidance a 6 Fr Safe-T-Centesis catheter was introduced. Thoracentesis was performed. The catheter  was removed and a dressing applied. FINDINGS: A total of approximately 1.2 L of clear, amber fluid was removed. Samples were sent to the laboratory as requested by the clinical team. IMPRESSION: Successful ultrasound guided right thoracentesis yielding 1.2 L of pleural fluid. Read by: Narda Rutherford, AGNP-BC Electronically Signed   By: Albin Felling M.D.   On: 06/19/2022 11:20   DG Chest Port 1 View  Result Date: 06/19/2022 CLINICAL DATA:  Status post thoracentesis EXAM: PORTABLE CHEST 1 VIEW COMPARISON:  Chest radiograph 1 day prior FINDINGS: The cardiomediastinal silhouette is stable. The right pleural effusion has decreased since the prior study with improved aeration of the right base. Nodular appearing opacities in the right hilar and suprahilar region are similar to the prior study and better assessed on recent CT. There is no new or worsening focal airspace disease. There is no significant left effusion. There is no pneumothorax The bones are stable. IMPRESSION: Decreased right pleural effusion following thoracentesis with improved aeration of the right lung. No pneumothorax. Electronically Signed   By: Valetta Mole M.D.   On: 06/19/2022 10:21     Radiology: US THORACENTESIS ASP PLEURAL SPACE W/IMG GUIDE  Result Date: 06/19/2022 INDICATION: Patient with history of known AFib, CKD, CHF and history of adenocarcinoma right lung status post radiation. Patient has recurrent right pleural effusion. Request for diagnostic and therapeutic right thoracentesis. EXAM: ULTRASOUND GUIDED DIAGNOSTIC AND THERAPEUTIC RIGHT THORACENTESIS MEDICATIONS: 15 mL 1% lidocaine and topical lidocaine spray COMPLICATIONS: None immediate. PROCEDURE: An ultrasound guided thoracentesis was thoroughly discussed with the patient and  questions answered. The benefits, risks, alternatives and complications were also discussed. The patient understands and wishes to proceed with the procedure. Written consent was obtained. Ultrasound was  performed to localize and mark an adequate pocket of fluid in the right chest. The area was then prepped and draped in the normal sterile fashion. 1% Lidocaine was used for local anesthesia. Under ultrasound guidance a 6 Fr Safe-T-Centesis catheter was introduced. Thoracentesis was performed. The catheter was removed and a dressing applied. FINDINGS: A total of approximately 1.2 L of clear, amber fluid was removed. Samples were sent to the laboratory as requested by the clinical team. IMPRESSION: Successful ultrasound guided right thoracentesis yielding 1.2 L of pleural fluid. Read by: Narda Rutherford, AGNP-BC Electronically Signed   By: Albin Felling M.D.   On: 06/19/2022 11:20   DG Chest Port 1 View  Result Date: 06/19/2022 CLINICAL DATA:  Status post thoracentesis EXAM: PORTABLE CHEST 1 VIEW COMPARISON:  Chest radiograph 1 day prior FINDINGS: The cardiomediastinal silhouette is stable. The right pleural effusion has decreased since the prior study with improved aeration of the right base. Nodular appearing opacities in the right hilar and suprahilar region are similar to the prior study and better assessed on recent CT. There is no new or worsening focal airspace disease. There is no significant left effusion. There is no pneumothorax The bones are stable. IMPRESSION: Decreased right pleural effusion following thoracentesis with improved aeration of the right lung. No pneumothorax. Electronically Signed   By: Valetta Mole M.D.   On: 06/19/2022 10:21   DG Chest Port 1 View  Result Date: 06/18/2022 CLINICAL DATA:  Follow-up study.  Pleural effusion. EXAM: PORTABLE CHEST 1 VIEW COMPARISON:  06/16/2022 and older exams. FINDINGS: Moderate right pleural effusion obscures hemidiaphragm and portions of the right heart border. There is additional hazy opacity in the right mid to lower lung consistent with a layering component of the pleural effusion and associated compressive atelectasis. Right upper lung is clear.  Left lung is hyperexpanded, but clear. Cardiac silhouette mildly enlarged. IMPRESSION: 1. No significant change from the most recent prior study. 2. Moderate right pleural effusion with associated dependent atelectasis. Electronically Signed   By: Lajean Manes M.D.   On: 06/18/2022 10:31   CT Angio Chest PE W and/or Wo Contrast  Result Date: 06/16/2022 CLINICAL DATA:  Pulmonary embolism (PE) suspected, positive D-dimer Increasing shortness of breath.  History of COPD. EXAM: CT ANGIOGRAPHY CHEST WITH CONTRAST TECHNIQUE: Multidetector CT imaging of the chest was performed using the standard protocol during bolus administration of intravenous contrast. Multiplanar CT image reconstructions and MIPs were obtained to evaluate the vascular anatomy. RADIATION DOSE REDUCTION: This exam was performed according to the departmental dose-optimization program which includes automated exposure control, adjustment of the mA and/or kV according to patient size and/or use of iterative reconstruction technique. CONTRAST:  59m OMNIPAQUE IOHEXOL 350 MG/ML SOLN COMPARISON:  Radiograph earlier today. Most recent chest CT 02/21/2022, additional chest CTs reviewed. Included lung bases from prior abdominal CTs reviewed. FINDINGS: Cardiovascular: There are no filling defects within the pulmonary arteries to suggest pulmonary embolus. Chronically dilated main pulmonary artery at 3.3 cm. Right left main pulmonary arteries are also dilated. Advanced aortic and branch atherosclerosis. Cannot assess for dissection due to phase of contrast tailored to pulmonary artery assessment. There is contrast refluxing into the hepatic veins and IVC. Chronic cardiomegaly. Coronary artery calcifications. No pericardial effusion. Mediastinum/Nodes: Soft tissue density both hila likely represent lymphoid tissue. No mediastinal adenopathy. The previous  prominent subcarinal node is not well-defined on the current exam. Small hiatal hernia again seen.  Lungs/Pleura: Moderate to large right pleural effusion, chronic, increased from prior CT as well as most recent abdominal CT 05/08/2022. There is associated compressive atelectasis in the lower lobe. Trace left pleural effusion with atelectasis in the lower lobe. Spiculated nodular opacity in the right upper lobe measures 2.5 x 1.8 cm, series 5, image 55, previously 2.5 x 1.5 cm. Masslike opacity in the anterior right upper lobe extending towards the pleural surface has slightly diminished currently measuring 3.5 x 1.9 cm, series 5, image 65 previously 3.8 x 2.1 cm. Moderate emphysema. Marked bronchial thickening particularly in the lower lobes with areas of mucous plugging and mucoid impaction. Bronchial thickening that has significantly progressed. Clustered nodular opacities in the superior segment of the left lower lobe measuring up to 9 mm, series 5, image 60, new. Chronic left apical scarring which is partially calcified. Upper Abdomen: Contrast refluxes into the hepatic veins and IVC. Atherosclerotic calcifications of the abdominal aorta, including coarse calcifications at the diaphragmatic hiatus. Dense aortic branch calcifications in the included upper abdomen. No free fluid or acute findings. Musculoskeletal: Chronic L2 superior endplate compression fracture. Left anterior rib fractures are remote. No acute osseous findings. There is minimal edema in the right lower lateral chest wall subcutaneous tissues. Review of the MIP images confirms the above findings. IMPRESSION: 1. No pulmonary embolus. 2. Moderate to large right pleural effusion, chronic, increased from prior CT as well as most recent abdominal CT 05/08/2022. There is associated compressive atelectasis in the right lower lobe. Trace left pleural effusion with atelectasis in the lower lobes. 3. Marked bronchial thickening particularly in the lower lobes with areas of mucous plugging and mucoid impaction. Progressive bronchial inflammation from  prior. 4. Clustered nodular opacities in the superior segment of the left lower lobe measuring up to 9 mm, new from prior CT. This may be infectious, inflammatory, or neoplastic. Recommend follow-up CT in 3 months or PET characterization. 5. Areas of masslike opacity in the right upper lobe are stable or slightly improved from prior exam. This may represent treated disease. Attention at follow-up recommended. 6. Chronic cardiomegaly. Coronary artery calcifications. 7. Chronically dilated main pulmonary artery consistent with pulmonary arterial hypertension. Aortic Atherosclerosis (ICD10-I70.0) and Emphysema (ICD10-J43.9). Electronically Signed   By: Keith Rake M.D.   On: 06/16/2022 23:27   DG Chest Portable 1 View  Result Date: 06/16/2022 CLINICAL DATA:  Shortness of breath EXAM: PORTABLE CHEST 1 VIEW COMPARISON:  Previous studies including the examination of 05/22/2022 FINDINGS: Transverse diameter of heart is increased. Central pulmonary vessels are more prominent. There is prominence of interstitial markings in the parahilar regions and lower lung fields. There is small right pleural effusion. Left lateral CP angle is clear. There is no pneumothorax. IMPRESSION: Cardiomegaly. Central pulmonary vessels are more prominent suggesting CHF. Small right pleural effusion. Electronically Signed   By: Elmer Picker M.D.   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 Patient has recurrent right pleural effusion. Request for therapeutic right thoracentesis. EXAM: ULTRASOUND GUIDED THERAPEUTIC RIGHT THORACENTESIS MEDICATIONS: 10 mL 1 % lidocaine COMPLICATIONS: None immediate. PROCEDURE: An ultrasound guided thoracentesis was thoroughly discussed with the patient and questions answered. The benefits, risks, alternatives  and complications were also discussed. The patient understands and wishes to  proceed with the procedure. Written consent was obtained. Ultrasound was performed to localize and mark an adequate pocket of fluid in the right chest. The area was then prepped and draped in the normal sterile fashion. 1% Lidocaine was used for local anesthesia. Under ultrasound guidance a 6 Fr Safe-T-Centesis catheter was introduced. Thoracentesis was performed. The catheter was removed and a dressing applied. FINDINGS: A total of approximately 900 cc of clear, amber fluid was removed. IMPRESSION: Successful ultrasound guided right thoracentesis yielding 900 cc of pleural fluid. Read by: Narda Rutherford, AGNP-BC Electronically Signed   By: Albin Felling M.D.   On: 05/23/2022 08:49   DG Chest Port 1 View  Result Date: 05/22/2022 CLINICAL DATA:  Right-sided thoracentesis. EXAM: PORTABLE CHEST 1 VIEW COMPARISON:  Earlier same day. FINDINGS: Significant interval improvement patient's right-sided effusion with only small amount of residual right pleural fluid noted. Patient is post right-sided thoracentesis. No evidence of pneumothorax. Stable density over the right mid to upper lung as well as stable prominence of the right hilum. Left lung is clear. Cardiomediastinal silhouette and remainder of the exam is unchanged. IMPRESSION: 1. Significant interval improvement in patient's right-sided pleural effusion post thoracentesis. No pneumothorax. Minimal residual right pleural fluid. 2. Stable changes over the right mid to upper lung and stable prominence of the right hilum. Electronically Signed   By: Marin Olp M.D.   On: 05/22/2022 17:58   DG Chest Portable 1 View  Result Date: 05/22/2022 CLINICAL DATA:  SOB EXAM: PORTABLE CHEST 1 VIEW COMPARISON:  Chest x-ray 05/08/2022, CT chest 02/21/2022 FINDINGS: The heart and mediastinal contours are unchanged. Atherosclerotic plaque. No focal consolidation. Chronic coarsened markings with no overt  pulmonary edema. Interval increase in a small to moderate volume right pleural effusion. No pneumothorax. No acute osseous abnormality. IMPRESSION: 1. Interval increase in a small to moderate right pleural effusion. 2. Aortic Atherosclerosis (ICD10-I70.0) and Emphysema (ICD10-J43.9). Electronically Signed   By: Iven Finn M.D.   On: 05/22/2022 16:25    ECHO 01/25/2022 IMPRESSIONS     1. Ant/apical/setal hpo.   2. Left ventricular ejection fraction, by estimation, is 35 to 40%. The  left ventricle has moderately decreased function. The left ventricle  demonstrates regional wall motion abnormalities (see scoring  diagram/findings for description). The left  ventricular internal cavity size was mildly dilated. Left ventricular  diastolic parameters are consistent with Grade II diastolic dysfunction  (pseudonormalization).   3. Right ventricular systolic function is low normal. The right  ventricular size is normal.   4. The mitral valve is normal in structure. Moderate mitral valve  regurgitation.   5. Tricuspid valve regurgitation is moderate to severe.   6. The aortic valve is normal in structure. Aortic valve regurgitation is  not visualized. Moderate aortic valve stenosis.   FINDINGS   Left Ventricle: Left ventricular ejection fraction, by estimation, is 35  to 40%. The left ventricle has moderately decreased function. The left  ventricle demonstrates regional wall motion abnormalities. The left  ventricular internal cavity size was  mildly dilated. There is borderline concentric left ventricular  hypertrophy. Left ventricular diastolic parameters are consistent with  Grade II diastolic dysfunction (pseudonormalization).   Right Ventricle: The right ventricular size is normal. No increase in  right ventricular wall thickness. Right ventricular systolic function is  low normal.   Left Atrium: Left atrial size was normal in size.   Right Atrium: Right atrial size was not  assessed.   Pericardium: There is no evidence of pericardial  effusion.   Mitral Valve: The mitral valve is normal in structure. Moderate mitral  valve regurgitation.   Tricuspid Valve: The tricuspid valve is grossly normal. Tricuspid valve  regurgitation is moderate to severe.   Aortic Valve: The aortic valve is normal in structure. Aortic valve  regurgitation is not visualized. Moderate aortic stenosis is present.  Aortic valve mean gradient measures 6.3 mmHg. Aortic valve peak gradient  measures 11.6 mmHg. Aortic valve area, by  VTI measures 0.80 cm.   Pulmonic Valve: The pulmonic valve was normal in structure. Pulmonic valve  regurgitation is trivial.   Aorta: The ascending aorta was not well visualized.   IAS/Shunts: No atrial level shunt detected by color flow Doppler.   Additional Comments: Ant/apical/setal hpo.      LEFT VENTRICLE  PLAX 2D  LVIDd:         3.90 cm  LVIDs:         3.00 cm  LV PW:         1.30 cm  LV IVS:        0.85 cm  LVOT diam:     2.00 cm  LV SV:         20  LV SV Index:   12  LVOT Area:     3.14 cm     LV Volumes (MOD)  LV vol d, MOD A2C: 35.6 ml  LV vol d, MOD A4C: 62.7 ml  LV vol s, MOD A2C: 23.2 ml  LV vol s, MOD A4C: 42.8 ml  LV SV MOD A2C:     12.4 ml  LV SV MOD A4C:     62.7 ml  LV SV MOD BP:      18.0 ml   RIGHT VENTRICLE  RV Basal diam:  3.70 cm  RV S prime:     10.20 cm/s  TAPSE (M-mode): 1.1 cm   LEFT ATRIUM           Index        RIGHT ATRIUM           Index  LA diam:      3.20 cm 2.00 cm/m   RA Area:     21.70 cm  LA Vol (A2C): 48.6 ml 30.37 ml/m  RA Volume:   71.60 ml  44.74 ml/m  LA Vol (A4C): 15.7 ml 9.81 ml/m   AORTIC VALVE                     PULMONIC VALVE  AV Area (Vmax):    0.81 cm      PV Vmax:        0.39 m/s  AV Area (Vmean):   0.84 cm      PV Vmean:       27.500 cm/s  AV Area (VTI):     0.80 cm      PV VTI:         0.051 m  AV Vmax:           170.00 cm/s   PV Peak grad:   0.6 mmHg  AV Vmean:           114.667 cm/s  PV Mean grad:   0.0 mmHg  AV VTI:            0.247 m       RVOT Peak grad: 2 mmHg  AV Peak Grad:      11.6 mmHg  AV Mean Grad:  6.3 mmHg  LVOT Vmax:         43.70 cm/s  LVOT Vmean:        30.800 cm/s  LVOT VTI:          0.063 m  LVOT/AV VTI ratio: 0.25     AORTA  Ao Root diam: 3.00 cm   MITRAL VALVE               TRICUSPID VALVE  MV Area (PHT): 8.34 cm    TR Peak grad:   42.5 mmHg  MV Decel Time: 91 msec     TR Vmax:        326.00 cm/s  MV E velocity: 70.30 cm/s  MV A velocity: 57.00 cm/s  SHUNTS  MV E/A ratio:  1.23        Systemic VTI:  0.06 m                             Systemic Diam: 2.00 cm                             Pulmonic VTI:  0.086 m   Yolonda Kida MD  Electronically signed by Yolonda Kida MD  Signature Date/Time: 01/30/2022/1:54:44 PM   TELEMETRY reviewed by me (LT) 06/20/2022 : NSR rate 60s  EKG reviewed by me: NSR 85 PVCs  Data reviewed by me (LT) 06/20/2022: hospitalist progress note, cross cover note, CBC, BMP, BNP, chest xray, telemetry, vitals   ASSESSMENT AND PLAN:  Danielle [name] is a 88yoF with a PMH of advanced COPD on 3L O2, paroxysmal atrial fibrillation/flutter (dose reduced eliquis held due to recent epistaxis), HFrEF (LVEF 35-40%, g2dd, mod-sev TR, mod AS, mod MS 01/2022), recurrent Right pleural effusion, CKD (baseline Cr. 2-2.5), remote history of lung cancer s/p radiation, esophageal cancer s/p radiation, MGUS, who presented to [facility] ED 06/16/2022 with shortness of breath x1 week, pleuritic chest discomfort with associated palpitations and dizziness.  She was initially requiring BiPAP, has now been weaned to 6 L by nasal cannula.  Urinary strep pneumo antigen positive, she was started on empiric ceftriaxone and azithromycin.  Cardiology is consulted for assistance with her A-fib with RVR and heart failure.  #Acute on chronic hypoxic respiratory failure Multifactorial in the setting of strep pneumo PNA, advanced COPD, and heart  failure -On empiric antibiotics for treatment of pneumonia -remains on 4 L today.  #Acute on chronic HFrEF (EF 35-40% 01/2022) #Recurrent right pleural effusion BNP elevated on admission at 2300, increasing to 3800 on 9/5. S/p right thoracentesis yielding 1.2 L clear amber fluid on 9/5.  Not grossly clinically volume overloaded appearing. -S/p IV Lasix 40 mg x 1, 60 mg x 3 with clinical improvement in her peripheral edema.  I's and O's not accurate. -restart home torsemide 20 mg once daily today -Continue GDMT with metoprolol 25 twice daily -Start low-dose hydralazine/Isordil 3 times daily for GDMT and afterload reduction. -Continue atorvastatin 40 mg daily  #Paroxysmal A-fib with RVR Reportedly in A-fib with RVR on admission.  Currently in sinus rhythm with heart rates in the 60s. -Continue amiodarone 200 mg once a day -Continue metoprolol 25 mg twice daily -Continue Eliquis 2.5 mg twice a day for stroke risk reduction.  CHA2DS2-VASc 4   #CKD 3 Renal function slightly worsening with diuresis from creatinine 1.5 and 38, 1.63 and 34 today  #Elevated troponin Trended 22-20-20 likely  secondary to renal dysfunction versus demand and not ACS.  NumberThis patient's plan of care was discussed and created with Dr. [name] and he is in agreement.  Signed: Tristan Schroeder , PA-C 06/20/2022, 11:18 AM [facility]  The patient continues to be relatively stable from the cardiovascular standpoint with improved oxygenation.  There has been continued normal sinus rhythm by telemetry on amiodarone metoprolol and Eliquis.  The patient has had stability of her chronic kidney disease and no evidence of acute myocardial infarction.  She does have some diffuse abdominal discomfort of unknown etiology which does not appear to be primarily cardiac in nature.  Currently she will need further ambulation improvements in symptoms before discharge  The patient has been interviewed and examined.  I agree with assessment and plan above. Serafina Royals MD [facility]

## 2022-06-20 NOTE — Assessment & Plan Note (Signed)
Currently blood pressure within goal. -Continue current medications

## 2022-06-20 NOTE — Telephone Encounter (Signed)
Patient was scheduled to see Sarah with Retacrit inj yesterday.  Plan for f/u after discharge?

## 2022-06-20 NOTE — Assessment & Plan Note (Signed)
S/p thoracentesis with removal of 1.2 L of clear fluid.  Cultures negative. -Continue to monitor

## 2022-06-20 NOTE — Assessment & Plan Note (Signed)
With RVR on presentation requiring Cardizem infusion.  Now spontaneously back to sinus rhythm and remained in sinus rhythm since then. -Continue with home amiodarone, metoprolol and Eliquis

## 2022-06-20 NOTE — Telephone Encounter (Signed)
Labs are in

## 2022-06-21 ENCOUNTER — Ambulatory Visit: Payer: Medicare Other

## 2022-06-21 ENCOUNTER — Encounter: Payer: Self-pay | Admitting: Internal Medicine

## 2022-06-21 ENCOUNTER — Other Ambulatory Visit: Payer: Medicare Other

## 2022-06-21 ENCOUNTER — Ambulatory Visit: Payer: Medicare Other | Admitting: Medical Oncology

## 2022-06-21 DIAGNOSIS — J9621 Acute and chronic respiratory failure with hypoxia: Secondary | ICD-10-CM | POA: Diagnosis not present

## 2022-06-21 LAB — COMP PANEL: LEUKEMIA/LYMPHOMA

## 2022-06-21 LAB — BASIC METABOLIC PANEL
Anion gap: 9 (ref 5–15)
BUN: 32 mg/dL — ABNORMAL HIGH (ref 8–23)
CO2: 29 mmol/L (ref 22–32)
Calcium: 8.5 mg/dL — ABNORMAL LOW (ref 8.9–10.3)
Chloride: 105 mmol/L (ref 98–111)
Creatinine, Ser: 1.77 mg/dL — ABNORMAL HIGH (ref 0.44–1.00)
GFR, Estimated: 31 mL/min — ABNORMAL LOW (ref >60)
Glucose, Bld: 81 mg/dL (ref 70–99)
Potassium: 3.3 mmol/L — ABNORMAL LOW (ref 3.5–5.1)
Sodium: 143 mmol/L (ref 135–145)

## 2022-06-21 MED ORDER — AMIODARONE HCL 200 MG PO TABS: 200.0000 mg | ORAL_TABLET | Freq: Every day | ORAL | 0 refills | Status: AC

## 2022-06-21 MED ORDER — POTASSIUM CHLORIDE CRYS ER 20 MEQ PO TBCR
40.0000 meq | EXTENDED_RELEASE_TABLET | Freq: Once | ORAL | Status: DC
Start: 2022-06-21 — End: 2022-06-21

## 2022-06-21 MED ORDER — SIMETHICONE 80 MG PO CHEW: 80.0000 mg | CHEWABLE_TABLET | Freq: Four times a day (QID) | ORAL | Status: DC | PRN

## 2022-06-21 MED ORDER — SIMETHICONE 80 MG PO CHEW: 80.0000 mg | CHEWABLE_TABLET | Freq: Four times a day (QID) | ORAL | 0 refills | Status: AC | PRN

## 2022-06-21 NOTE — TOC Transition Note (Addendum)
Transition of Care [facility]) - CM/SW Discharge Note   Patient Details  Name: Danielle [name] MRN: [id number] Date of Birth: 1956-04-23  [facility] CM/SW Contact:  Alberteen Sam, LCSW Phone Number: 06/21/2022, 11:08 AM   Clinical Narrative:     Patient to discharge home today.   Baseline she is on 4L of O2 at home via [employer], Thedore Mins with [employer] aware patient is interested in portable concentrators in which they will follow up.    Patient uses [employer] and [name] as PCP. Patient reports she has a walker at home and no dme needs.   Family to transport at dc. Uses bus transportation typically to get to and from apts.   [employer] is delivering O2 to hospital room for patient discharge, patient can leave once family arrives for transport. RN made aware.    No further discharge needs noted, TOC signing off.    Final next level of care: Home/Self Care Barriers to Discharge: No Barriers Identified   Patient Goals and CMS Choice Patient states their goals for this hospitalization and ongoing recovery are:: to go home CMS Medicare.gov Compare Post Acute Care list provided to:: Patient Choice offered to / list presented to : Patient  Discharge Placement                       Discharge Plan and Services                                     Social Determinants of Health (SDOH) Interventions Food Insecurity Interventions: Patient Refused, Inpatient TOC   Readmission Risk Interventions    05/09/2022    3:11 PM 01/29/2022   11:36 AM 11/29/2021   10:49 AM  Readmission Risk Prevention Plan  Transportation Screening Complete Complete Complete  Medication Review Press photographer) Complete Complete Complete  PCP or Specialist appointment within 3-5 days of discharge  Complete Complete  HRI or Home Care Consult Patient refused  Complete  SW Recovery Care/Counseling Consult Complete Complete Complete  Palliative Care Screening Not Applicable Not  Applicable Not [place] Not Applicable Not Applicable Not Applicable

## 2022-06-21 NOTE — Discharge Summary (Signed)
Physician Discharge Summary   Patient: Danielle [name] MRN: [id number] DOB: 07-15-1956  Admit date:     06/16/2022  Discharge date: 06/21/22  Discharge Physician: Lorella Nimrod   PCP: [name], PA   Recommendations at discharge:  Please obtain CBC and BMP in 1 week Follow-up with primary care provider within a week Follow-up with cardiology  Discharge Diagnoses: Principal Problem:   Acute on chronic respiratory failure with hypoxia [facility]) Active Problems:   Streptococcal pneumonia ([place])   Recurrent right pleural effusion   Acute on chronic combined systolic and diastolic CHF (congestive heart failure) (HCC)   Paroxysmal atrial fibrillation ([place])   Severe protein-calorie malnutrition ([place])   Essential hypertension   GERD (gastroesophageal reflux disease)   CKD (chronic kidney disease), stage IV [facility])   Hospital Course: Taken from prior notes.  66 years old female with PMH significant for lung Cancer, CHF combined systolic and diastolic, COPD, recurrent right pleural effusion, chronic hypoxic respiratory failure on 4 L of supplemental oxygen at baseline, Esophageal CA, paroxysmal A-fib, GERD, CKD stage IIIb, hyperlipidemia, hypertension presented in the ED with complaints of worsening shortness of breath for 1 week. Patient also reports having pleuritic chest pain associated with palpitations and dizziness.  In the ED Patient was found to be having acute on chronic hypoxic respiratory failure secondary to fluid overload requiring BiPAP support and later transitioned to 6 L of oxygen.  Patient was also given IV diuresis.  CTA was negative for PE but did show moderate to large right pleural effusion which was increased from her most recent CT abdomen done in July 2023.  There was also concerning marked bronchial thickening particularly in the lower lobes with areas of mucous plugging and mucoid impaction. Clustered nodular opacities in the superior segment of the left lower lobe which  were new from prior CT which can be infectious or neoplastic they were recommending repeat CT or PET scan in 3 months. Areas of masslike opacity in the right upper lobe are stable. Chronically dilated main pulmonary artery consistent with pulmonary arterial hypertension. Strep pneumo antigen was positive. She was initially also found to be in A-fib with RVR requiring Cardizem infusion and cardiology was consulted. Patient also underwent IR guided right thoracentesis with removal of 1.2 L of clear fluid.  Cultures negative.  9/6: Remains stable from respiratory standpoint, saturating 100% on her baseline oxygen requirement of 4 L. Abdominal x-ray was obtained with complaint of periumbilical pain, which did not show any obvious obstruction.  Having bowel movements.  Cardiology switched her back to home torsemide. Remained in sinus rhythm.  9/7: Patient completed a course of antibiotics for strep pneumonia.  Remained stable on her baseline oxygen requirement of 4 L.  Cardiology suggested decreasing the dose of amiodarone to 200 mg daily instead of twice daily. Patient continued to have very nonspecific periumbilical pain, no relationship with food.  Feels more like gas.  Having regular bowel movements with no recent change.  At baseline she have multiple soft bowel movements daily.  She was started on Mylanta to be used as needed. Renal functions stable around baseline.  Patient will continue on current medications and need to have a close follow-up with her providers for further recommendations.  Assessment and Plan: * Acute on chronic respiratory failure with hypoxia (HCC) Initially requiring BiPAP, not transition back to baseline oxygen requirement of 4 L. Most likely multifactorial with recurrent pleural effusion, strep pneumo pneumonia and acute on chronic combined heart failure. -Current continue  with supplemental oxygen.  Streptococcal pneumonia ([place]) Strep pneumo urinary antigen  positive, Legionella negative. - Continue with ceftriaxone and Zithromax -Continue with pulmonary toilet  Recurrent right pleural effusion S/p thoracentesis with removal of 1.2 L of clear fluid.  Cultures negative. -Continue to monitor  Acute on chronic combined systolic and diastolic CHF (congestive heart failure) (HCC) Elevated BNP at 3240.  Cardiology is on board.  She received IV diuresis. Clinically appears euvolemic now. -Now switched back to home torsemide. -Continue to monitor -Continue with metoprolol  Paroxysmal atrial fibrillation (HCC) With RVR on presentation requiring Cardizem infusion.  Now spontaneously back to sinus rhythm and remained in sinus rhythm since then. -Continue with home amiodarone, metoprolol and Eliquis  Severe protein-calorie malnutrition (HCC) Estimated body mass index is 18.6 kg/m as calculated from the following:   Height as of this encounter: 5\' 5"  (1.651 m).   Weight as of this encounter: 50.7 kg.   -Dietitian consult  Essential hypertension Currently blood pressure within goal. -Continue current medications  GERD (gastroesophageal reflux disease) - Continue PPI  CKD (chronic kidney disease), stage IV (HCC) Seems stable and at baseline. Baseline creatinine of 1.6-1.7         Consultants: Cardiology Procedures performed: None Disposition: Home Diet recommendation:  Discharge Diet Orders (From admission, onward)     Start     Ordered   06/21/22 0000  Diet - low sodium heart healthy        06/21/22 1106           Cardiac diet DISCHARGE MEDICATION: Allergies as of 06/21/2022   No Known Allergies      Medication List     STOP taking these medications    amLODipine 5 MG tablet Commonly known as: NORVASC       TAKE these medications    acetaminophen 325 MG tablet Commonly known as: TYLENOL Take 2 tablets (650 mg total) by mouth every 6 (six) hours as needed for mild pain (or Fever >/= 101). What changed:   when to take this reasons to take this   albuterol 108 (90 Base) MCG/ACT inhaler Commonly known as: VENTOLIN HFA Inhale 2 puffs into the lungs every 6 (six) hours as needed for wheezing or shortness of breath.   amiodarone 200 MG tablet Commonly known as: PACERONE Take 1 tablet (200 mg total) by mouth daily. What changed: when to take this   apixaban 2.5 MG Tabs tablet Commonly known as: ELIQUIS Take 1 tablet (2.5 mg total) by mouth 2 (two) times daily.   atorvastatin 40 MG tablet Commonly known as: LIPITOR Take 1 tablet (40 mg total) by mouth daily.   calcium carbonate 500 MG chewable tablet Commonly known as: TUMS - dosed in mg elemental calcium Chew 0.5 tablets by mouth daily as needed.   calcium-vitamin D 500-5 MG-MCG tablet Commonly known as: OSCAL WITH D Take 1 tablet by mouth daily.   citalopram 20 MG tablet Commonly known as: CELEXA Take 1 tablet (20 mg total) by mouth daily. What changed: how much to take   ferrous sulfate 325 (65 FE) MG tablet Take 325 mg by mouth 2 (two) times daily with a meal.   hydrALAZINE 10 MG tablet Commonly known as: APRESOLINE Take 1 tablet (10 mg total) by mouth 3 (three) times daily.   isosorbide dinitrate 5 MG tablet Commonly known as: ISORDIL Take 1 tablet (5 mg total) by mouth 3 (three) times daily.   loperamide 2 MG capsule Commonly known as: IMODIUM Take  2 mg by mouth daily as needed for diarrhea or loose stools.   metoprolol tartrate 25 MG tablet Commonly known as: LOPRESSOR Take 1 tablet (25 mg total) by mouth 2 (two) times daily.   multivitamin with minerals Tabs tablet Take 1 tablet by mouth daily.   OXYGEN Inhale 4 L into the lungs daily.   pantoprazole 40 MG tablet Commonly known as: Protonix Take 1 tablet (40 mg total) by mouth 2 (two) times daily before a meal.   simethicone 80 MG chewable tablet Commonly known as: MYLICON Chew 1 tablet (80 mg total) by mouth every 6 (six) hours as needed for  flatulence.   sodium bicarbonate 650 MG tablet Take 1,300 mg by mouth daily.   sucralfate 1 GM/10ML suspension Commonly known as: CARAFATE Take 0.5 g by mouth 2 (two) times daily.   Torsemide 40 MG Tabs Take 20 mg by mouth daily. Hold this medication until you see your cardiologist   Trelegy Ellipta 100-62.5-25 MCG/ACT Aepb Generic drug: Fluticasone-Umeclidin-Vilant Inhale 1 puff into the lungs daily.   zolpidem 5 MG tablet Commonly known as: AMBIEN Take 5 mg by mouth at bedtime as needed for sleep.               Durable Medical Equipment  (From admission, onward)           Start     Ordered   06/19/22 1223  For home use only DME Other see comment  Once       Comments: POC  Question:  Length of Need  Answer:  Lifetime   06/19/22 1223            Follow-up Information     Corey Skains, MD. Go in 1 week(s).   Specialty: Cardiology Contact information: [address] [place] [facility] [place] [address] [phone]         [name], [name]. Schedule an appointment as soon as possible for a visit in 1 week(s).   Specialty: Family Medicine Contact information: [address] [place] [address] [phone]                Discharge Exam: Danley Danker Weights   06/18/22 0437  Weight: 50.7 kg   General.  Malnourished lady, in no acute distress. Pulmonary.  Lungs clear bilaterally, normal respiratory effort.  Left mastectomy. CV.  Regular rate and rhythm, no JVD, rub or murmur. Abdomen.  Soft, nontender, nondistended, BS positive. CNS.  Alert and oriented .  No focal neurologic deficit. Extremities.  No edema, no cyanosis, pulses intact and symmetrical. Psychiatry.  Judgment and insight appears normal.   Condition at discharge: stable  The results of significant diagnostics from this hospitalization (including imaging, microbiology, ancillary and laboratory) are listed below for reference.   Imaging  Studies: DG Abd 1 View  Result Date: 06/20/2022 CLINICAL DATA:  Abdominal pain.  History of small-bowel obstruction. EXAM: ABDOMEN - 1 VIEW COMPARISON:  CT abdomen pelvis-05/08/2022; chest radiograph-06/19/2022; 06/18/2022 Ultrasound-guided right-sided thoracentesis-06/19/2022 (yielding 1.2 L of pleural fluid). FINDINGS: Paucity of bowel gas precludes evaluation for enteric obstruction. No pneumoperitoneum, pneumatosis or portal venous gas. Surgical clips overlie the lower pelvis bilaterally. Phleboliths and vascular calcifications overlie the lower pelvis bilaterally. Atherosclerotic plaque within the abdominal aorta. Limited visualization of the lower thorax suggests a trace right-sided pleural effusion, similar to prior abdominal CT performed 05/08/2022. No acute osseous abnormalities. Degenerative change of the lower lumbar spine is suspected though incompletely evaluated. IMPRESSION: 1. Paucity of bowel  gas precludes evaluation for enteric obstruction. 2. Trace right-sided pleural effusion following recent right-sided thoracentesis. Electronically Signed   By: Sandi Mariscal M.D.   On: 06/20/2022 12:57   US THORACENTESIS ASP PLEURAL SPACE W/IMG GUIDE  Result Date: 06/19/2022 INDICATION: Patient with history of known AFib, CKD, CHF and history of adenocarcinoma right lung status post radiation. Patient has recurrent right pleural effusion. Request for diagnostic and therapeutic right thoracentesis. EXAM: ULTRASOUND GUIDED DIAGNOSTIC AND THERAPEUTIC RIGHT THORACENTESIS MEDICATIONS: 15 mL 1% lidocaine and topical lidocaine spray COMPLICATIONS: None immediate. PROCEDURE: An ultrasound guided thoracentesis was thoroughly discussed with the patient and questions answered. The benefits, risks, alternatives and complications were also discussed. The patient understands and wishes to proceed with the procedure. Written consent was obtained. Ultrasound was performed to localize and mark an adequate pocket of fluid in  the right chest. The area was then prepped and draped in the normal sterile fashion. 1% Lidocaine was used for local anesthesia. Under ultrasound guidance a 6 Fr Safe-T-Centesis catheter was introduced. Thoracentesis was performed. The catheter was removed and a dressing applied. FINDINGS: A total of approximately 1.2 L of clear, amber fluid was removed. Samples were sent to the laboratory as requested by the clinical team. IMPRESSION: Successful ultrasound guided right thoracentesis yielding 1.2 L of pleural fluid. Read by: Narda Rutherford, AGNP-BC Electronically Signed   By: Albin Felling M.D.   On: 06/19/2022 11:20   DG Chest Port 1 View  Result Date: 06/19/2022 CLINICAL DATA:  Status post thoracentesis EXAM: PORTABLE CHEST 1 VIEW COMPARISON:  Chest radiograph 1 day prior FINDINGS: The cardiomediastinal silhouette is stable. The right pleural effusion has decreased since the prior study with improved aeration of the right base. Nodular appearing opacities in the right hilar and suprahilar region are similar to the prior study and better assessed on recent CT. There is no new or worsening focal airspace disease. There is no significant left effusion. There is no pneumothorax The bones are stable. IMPRESSION: Decreased right pleural effusion following thoracentesis with improved aeration of the right lung. No pneumothorax. Electronically Signed   By: Valetta Mole M.D.   On: 06/19/2022 10:21   DG Chest Port 1 View  Result Date: 06/18/2022 CLINICAL DATA:  Follow-up study.  Pleural effusion. EXAM: PORTABLE CHEST 1 VIEW COMPARISON:  06/16/2022 and older exams. FINDINGS: Moderate right pleural effusion obscures hemidiaphragm and portions of the right heart border. There is additional hazy opacity in the right mid to lower lung consistent with a layering component of the pleural effusion and associated compressive atelectasis. Right upper lung is clear. Left lung is hyperexpanded, but clear. Cardiac silhouette mildly  enlarged. IMPRESSION: 1. No significant change from the most recent prior study. 2. Moderate right pleural effusion with associated dependent atelectasis. Electronically Signed   By: Lajean Manes M.D.   On: 06/18/2022 10:31   CT Angio Chest PE W and/or Wo Contrast  Result Date: 06/16/2022 CLINICAL DATA:  Pulmonary embolism (PE) suspected, positive D-dimer Increasing shortness of breath.  History of COPD. EXAM: CT ANGIOGRAPHY CHEST WITH CONTRAST TECHNIQUE: Multidetector CT imaging of the chest was performed using the standard protocol during bolus administration of intravenous contrast. Multiplanar CT image reconstructions and MIPs were obtained to evaluate the vascular anatomy. RADIATION DOSE REDUCTION: This exam was performed according to the departmental dose-optimization program which includes automated exposure control, adjustment of the mA and/or kV according to patient size and/or use of iterative reconstruction technique. CONTRAST:  51mL OMNIPAQUE IOHEXOL 350 MG/ML SOLN  COMPARISON:  Radiograph earlier today. Most recent chest CT 02/21/2022, additional chest CTs reviewed. Included lung bases from prior abdominal CTs reviewed. FINDINGS: Cardiovascular: There are no filling defects within the pulmonary arteries to suggest pulmonary embolus. Chronically dilated main pulmonary artery at 3.3 cm. Right left main pulmonary arteries are also dilated. Advanced aortic and branch atherosclerosis. Cannot assess for dissection due to phase of contrast tailored to pulmonary artery assessment. There is contrast refluxing into the hepatic veins and IVC. Chronic cardiomegaly. Coronary artery calcifications. No pericardial effusion. Mediastinum/Nodes: Soft tissue density both hila likely represent lymphoid tissue. No mediastinal adenopathy. The previous prominent subcarinal node is not well-defined on the current exam. Small hiatal hernia again seen. Lungs/Pleura: Moderate to large right pleural effusion, chronic, increased  from prior CT as well as most recent abdominal CT 05/08/2022. There is associated compressive atelectasis in the lower lobe. Trace left pleural effusion with atelectasis in the lower lobe. Spiculated nodular opacity in the right upper lobe measures 2.5 x 1.8 cm, series 5, image 55, previously 2.5 x 1.5 cm. Masslike opacity in the anterior right upper lobe extending towards the pleural surface has slightly diminished currently measuring 3.5 x 1.9 cm, series 5, image 65 previously 3.8 x 2.1 cm. Moderate emphysema. Marked bronchial thickening particularly in the lower lobes with areas of mucous plugging and mucoid impaction. Bronchial thickening that has significantly progressed. Clustered nodular opacities in the superior segment of the left lower lobe measuring up to 9 mm, series 5, image 60, new. Chronic left apical scarring which is partially calcified. Upper Abdomen: Contrast refluxes into the hepatic veins and IVC. Atherosclerotic calcifications of the abdominal aorta, including coarse calcifications at the diaphragmatic hiatus. Dense aortic branch calcifications in the included upper abdomen. No free fluid or acute findings. Musculoskeletal: Chronic L2 superior endplate compression fracture. Left anterior rib fractures are remote. No acute osseous findings. There is minimal edema in the right lower lateral chest wall subcutaneous tissues. Review of the MIP images confirms the above findings. IMPRESSION: 1. No pulmonary embolus. 2. Moderate to large right pleural effusion, chronic, increased from prior CT as well as most recent abdominal CT 05/08/2022. There is associated compressive atelectasis in the right lower lobe. Trace left pleural effusion with atelectasis in the lower lobes. 3. Marked bronchial thickening particularly in the lower lobes with areas of mucous plugging and mucoid impaction. Progressive bronchial inflammation from prior. 4. Clustered nodular opacities in the superior segment of the left  lower lobe measuring up to 9 mm, new from prior CT. This may be infectious, inflammatory, or neoplastic. Recommend follow-up CT in 3 months or PET characterization. 5. Areas of masslike opacity in the right upper lobe are stable or slightly improved from prior exam. This may represent treated disease. Attention at follow-up recommended. 6. Chronic cardiomegaly. Coronary artery calcifications. 7. Chronically dilated main pulmonary artery consistent with pulmonary arterial hypertension. Aortic Atherosclerosis (ICD10-I70.0) and Emphysema (ICD10-J43.9). Electronically Signed   By: Keith Rake M.D.   On: 06/16/2022 23:27   DG Chest Portable 1 View  Result Date: 06/16/2022 CLINICAL DATA:  Shortness of breath EXAM: PORTABLE CHEST 1 VIEW COMPARISON:  Previous studies including the examination of 05/22/2022 FINDINGS: Transverse diameter of heart is increased. Central pulmonary vessels are more prominent. There is prominence of interstitial markings in the parahilar regions and lower lung fields. There is small right pleural effusion. Left lateral CP angle is clear. There is no pneumothorax. IMPRESSION: Cardiomegaly. Central pulmonary vessels are more prominent suggesting CHF. Small right  pleural effusion. Electronically Signed   By: Elmer Picker M.D.   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 Patient has recurrent right pleural effusion. Request for therapeutic right thoracentesis. EXAM: ULTRASOUND GUIDED THERAPEUTIC RIGHT THORACENTESIS MEDICATIONS: 10 mL 1 % lidocaine COMPLICATIONS: None immediate. PROCEDURE: An ultrasound guided thoracentesis was thoroughly discussed with the patient and questions answered. The benefits, risks, alternatives and complications were also discussed. The patient understands and wishes to  proceed with the procedure. Written consent was obtained. Ultrasound was performed to localize and mark an adequate pocket of fluid in the right chest. The area was then prepped and draped in the normal sterile fashion. 1% Lidocaine was used for local anesthesia. Under ultrasound guidance a 6 Fr Safe-T-Centesis catheter was introduced. Thoracentesis was performed. The catheter was removed and a dressing applied. FINDINGS: A total of approximately 900 cc of clear, amber fluid was removed. IMPRESSION: Successful ultrasound guided right thoracentesis yielding 900 cc of pleural fluid. Read by: Narda Rutherford, AGNP-BC Electronically Signed   By: Albin Felling M.D.   On: 05/23/2022 08:49   DG Chest Port 1 View  Result Date: 05/22/2022 CLINICAL DATA:  Right-sided thoracentesis. EXAM: PORTABLE CHEST 1 VIEW COMPARISON:  Earlier same day. FINDINGS: Significant interval improvement patient's right-sided effusion with only small amount of residual right pleural fluid noted. Patient is post right-sided thoracentesis. No evidence of pneumothorax. Stable density over the right mid to upper lung as well as stable prominence of the right hilum. Left lung is clear. Cardiomediastinal silhouette and remainder of the exam is unchanged. IMPRESSION: 1. Significant interval improvement in patient's right-sided pleural effusion post thoracentesis. No pneumothorax. Minimal residual right pleural fluid. 2. Stable changes over the right mid to upper lung and stable prominence of the right hilum. Electronically Signed   By: Marin Olp M.D.   On: 05/22/2022 17:58   DG Chest Portable 1 View  Result Date: 05/22/2022 CLINICAL DATA:  SOB EXAM: PORTABLE CHEST 1 VIEW COMPARISON:  Chest x-ray 05/08/2022, CT chest 02/21/2022 FINDINGS: The heart and mediastinal contours are unchanged. Atherosclerotic plaque. No focal consolidation. Chronic coarsened markings with no overt pulmonary edema. Interval increase in a small to moderate volume right  pleural effusion. No pneumothorax. No acute osseous abnormality. IMPRESSION: 1. Interval increase in a small to moderate right pleural effusion. 2. Aortic Atherosclerosis (ICD10-I70.0) and Emphysema (ICD10-J43.9). Electronically Signed   By: Iven Finn M.D.   On: 05/22/2022 16:25    Microbiology: Results for orders placed or performed during the hospital encounter of 06/16/22  SARS Coronavirus 2 by RT PCR (hospital order, performed in [facility] hospital lab) *cepheid single result test* Anterior Nasal Swab     Status: None   Collection Time: 06/16/22  8:41 PM   Specimen: Anterior Nasal Swab  Result Value Ref Range Status   SARS Coronavirus 2 by RT PCR NEGATIVE NEGATIVE Final    Comment: (NOTE) SARS-CoV-2 target nucleic acids are NOT DETECTED.  The SARS-CoV-2 RNA is generally detectable in upper and lower respiratory specimens during the acute phase of infection. The lowest concentration of SARS-CoV-2 viral copies this assay can detect is 250 copies / mL. A negative result does not preclude SARS-CoV-2 infection and should not be used as the sole basis for treatment or other patient management decisions.  A negative result may occur with improper  specimen collection / handling, submission of specimen other than nasopharyngeal swab, presence of viral mutation(s) within the areas targeted by this assay, and inadequate number of viral copies (<250 copies / mL). A negative result must be combined with clinical observations, patient history, and epidemiological information.  Fact Sheet for Patients:   https://www.patel.info/  Fact Sheet for Healthcare Providers: https://hall.com/  This test is not yet approved or  cleared by the [place] FDA and has been authorized for detection and/or diagnosis of SARS-CoV-2 by FDA under an Emergency Use Authorization (EUA).  This EUA will remain in effect (meaning this test can be used) for the  duration of the COVID-19 declaration under Section 564(b)(1) of the Act, 21 U.S.C. section 360bbb-3(b)(1), unless the authorization is terminated or revoked sooner.  Performed at [facility], [address] [place], [place] [address]   MRSA Next Gen by PCR, Nasal     Status: None   Collection Time: 06/17/22 10:44 AM   Specimen: Nasopharyngeal Swab; Nasal Swab  Result Value Ref Range Status   MRSA by PCR Next Gen NOT DETECTED NOT DETECTED Final    Comment: (NOTE) The GeneXpert MRSA Assay (FDA approved for NASAL specimens only), is one component of a comprehensive MRSA colonization surveillance program. It is not intended to diagnose MRSA infection nor to guide or monitor treatment for MRSA infections. Test performance is not FDA approved in patients less than 46 years old. Performed at [facility], [place], [place] [address]   Respiratory (~20 pathogens) panel by PCR     Status: None   Collection Time: 06/17/22 10:44 AM   Specimen: Nasopharyngeal Swab; Respiratory  Result Value Ref Range Status   Adenovirus NOT DETECTED NOT DETECTED Final   Coronavirus 229E NOT DETECTED NOT DETECTED Final    Comment: (NOTE) The Coronavirus on the Respiratory Panel, DOES NOT test for the novel  Coronavirus (2019 nCoV)    Coronavirus HKU1 NOT DETECTED NOT DETECTED Final   Coronavirus NL63 NOT DETECTED NOT DETECTED Final   Coronavirus OC43 NOT DETECTED NOT DETECTED Final   Metapneumovirus NOT DETECTED NOT DETECTED Final   Rhinovirus / Enterovirus NOT DETECTED NOT DETECTED Final   Influenza A NOT DETECTED NOT DETECTED Final   Influenza B NOT DETECTED NOT DETECTED Final   Parainfluenza Virus 1 NOT DETECTED NOT DETECTED Final   Parainfluenza Virus 2 NOT DETECTED NOT DETECTED Final   Parainfluenza Virus 3 NOT DETECTED NOT DETECTED Final   Parainfluenza Virus 4 NOT DETECTED NOT DETECTED Final   Respiratory Syncytial Virus NOT DETECTED NOT DETECTED Final    Bordetella pertussis NOT DETECTED NOT DETECTED Final   Bordetella Parapertussis NOT DETECTED NOT DETECTED Final   Chlamydophila pneumoniae NOT DETECTED NOT DETECTED Final   Mycoplasma pneumoniae NOT DETECTED NOT DETECTED Final    Comment: Performed at [facility], [place]. [address], [place], [place] [address]  Culture, blood (routine x 2) Call MD if unable to obtain prior to antibiotics being given     Status: None (Preliminary result)   Collection Time: 06/18/22  4:38 AM   Specimen: BLOOD  Result Value Ref Range Status   Specimen Description BLOOD BLOOD RIGHT FOREARM  Final   Special Requests   Final    BOTTLES DRAWN AEROBIC AND ANAEROBIC Blood Culture adequate volume   Culture   Final    NO GROWTH 3 DAYS Performed at [facility], [address], [place], [place] [address]    Report Status  PENDING  Incomplete  Culture, blood (routine x 2) Call MD if unable to obtain prior to antibiotics being given     Status: None (Preliminary result)   Collection Time: 06/18/22  4:38 AM   Specimen: BLOOD  Result Value Ref Range Status   Specimen Description BLOOD BLOOD RIGHT HAND  Final   Special Requests   Final    BOTTLES DRAWN AEROBIC AND ANAEROBIC Blood Culture adequate volume   Culture   Final    NO GROWTH 3 DAYS Performed at [facility], [address] [place], [place] [address]    Report Status PENDING  Incomplete  C Difficile Quick Screen w PCR reflex     Status: None   Collection Time: 06/18/22 10:00 AM   Specimen: STOOL  Result Value Ref Range Status   C Diff antigen NEGATIVE NEGATIVE Final   C Diff toxin NEGATIVE NEGATIVE Final   C Diff interpretation No C. difficile detected.  Final    Comment: Performed at [facility], [place], [place] [address]  Acid Fast Smear (AFB)     Status: None   Collection Time: 06/19/22  9:56 AM   Specimen: PATH Cytology Pleural fluid  Result Value Ref Range Status   AFB Specimen Processing  Concentration  Final   Acid Fast Smear Negative  Final    Comment: (NOTE) Performed At: [facility], [place] [id number] Rush Farmer MD KK:[id number]    Source (AFB) PLEURAL  Final    Comment: Performed at [facility], [address] [place], [place] [address]  Body fluid culture w Gram Stain     Status: None (Preliminary result)   Collection Time: 06/19/22  9:56 AM   Specimen: PATH Cytology Pleural fluid  Result Value Ref Range Status   Specimen Description   Final    PLEURAL Performed at [facility], [address] [place], [place] [address]    Special Requests   Final    NONE Performed at [facility], [address] [place], [place] [address]    Gram Stain   Final    RARE WBC PRESENT, PREDOMINANTLY MONONUCLEAR NO ORGANISMS SEEN    Culture   Final    NO GROWTH 2 DAYS Performed at [facility], [place] [address] [place],  [address]    Report Status PENDING  Incomplete    Labs: CBC: Recent Labs  Lab 06/16/22 2024 06/17/22 1044 06/18/22 0438 06/18/22 0923 06/19/22 0526 06/20/22 0619  WBC 6.5 3.1* SPECIMEN CONTAMINATED, UNABLE TO PERFORM TEST(S). 5.9 5.6 6.2  NEUTROABS 4.2 2.5  --   --   --   --   HGB 10.2* 8.4* SPECIMEN CONTAMINATED, UNABLE TO PERFORM TEST(S). 8.7* 8.0* 8.3*  HCT 32.0* 26.4* SPECIMEN CONTAMINATED, UNABLE TO PERFORM TEST(S). 26.4* 24.6* 25.1*  MCV 97.6 97.4 SPECIMEN CONTAMINATED, UNABLE TO PERFORM TEST(S). 95.0 95.0 94.7  PLT 228 244 SPECIMEN CONTAMINATED, UNABLE TO PERFORM TEST(S). 224 216 277   Basic Metabolic Panel: Recent Labs  Lab 06/18/22 0438 06/18/22 0923 06/19/22 0526 06/20/22 0619 06/21/22 0832  NA SPECIMEN CONTAMINATED, UNABLE TO PERFORM TEST(S). 142 142 140 143  K SPECIMEN CONTAMINATED, UNABLE TO PERFORM TEST(S). 4.1 3.8 3.6 3.3*  CL SPECIMEN CONTAMINATED, UNABLE TO PERFORM TEST(S). 108 106 106 105  CO2 SPECIMEN CONTAMINATED, UNABLE TO PERFORM  TEST(S). 27 26 27 29   GLUCOSE SPECIMEN CONTAMINATED, UNABLE TO PERFORM TEST(S). 105* 131* 142* 81  BUN SPECIMEN CONTAMINATED, UNABLE TO  PERFORM TEST(S). 28* 33* 33* 32*  CREATININE SPECIMEN CONTAMINATED, UNABLE TO PERFORM TEST(S). 1.61* 1.72* 1.63* 1.77*  CALCIUM SPECIMEN CONTAMINATED, UNABLE TO PERFORM TEST(S). 8.9 8.9 8.6* 8.5*  MG SPECIMEN CONTAMINATED, UNABLE TO PERFORM TEST(S). 1.5* 2.2  --   --   PHOS SPECIMEN CONTAMINATED, UNABLE TO PERFORM TEST(S). 3.0 2.5  --   --    Liver Function Tests: Recent Labs  Lab 06/16/22 2024 06/17/22 1044  AST 24 23  ALT 18 17  ALKPHOS 58 52  BILITOT 0.7 1.1  PROT 7.4 7.4  ALBUMIN 3.7 3.4*   CBG: No results for input(s): "GLUCAP" in the last 168 hours.  Discharge time spent: greater than 30 minutes.  This record has been created using Systems analyst. Errors have been sought and corrected,but may not always be located. Such creation errors do not reflect on the standard of care.   Signed: Lorella Nimrod, MD [facility] 06/21/2022

## 2022-06-21 NOTE — Progress Notes (Addendum)
[name] NOTE       Patient ID: Danielle [name] MRN: [id number] DOB/AGE: May 22, 1956 66 y.o.  Admit date: 06/16/2022 Referring Physician Dwyane Dee  Primary Physician [name], PA-C Primary Cardiologist [name] Reason for Consultation SOB   HPI: Danielle [name] is a 17yoF with a PMH of advanced COPD on 3L O2, paroxysmal atrial fibrillation/flutter (dose reduced eliquis held due to recent epistaxis), HFrEF (LVEF 35-40%, g2dd, mod-sev TR, mod AS, mod MS 01/2022), recurrent Right pleural effusion, CKD (baseline Cr. 2-2.5), remote history of lung cancer s/p radiation, esophageal cancer s/p radiation, MGUS, h/o R colectomy with ileostomy s/p ileostomy takedown 11/2021, who presented to [facility] ED 06/16/2022 with shortness of breath x1 week, pleuritic chest discomfort with associated palpitations and dizziness.  She was initially requiring BiPAP, has now been weaned to 6 L by nasal cannula.  Urinary strep pneumo antigen positive, she was started on empiric ceftriaxone and azithromycin.  Cardiology is consulted for assistance with her A-fib with RVR and heart failure.  Interval History: -no acute events  -feels overall better today, has some gas pains but less than yesterday -no chest pain shortness of breath, palpitations, peripheral edema -Remains in sinus rhythm on telemetry  Review of systems complete and found to be negative unless listed above     Past Medical History:  Diagnosis Date   Acute respiratory failure due to COVID-19 ([place]) 09/27/2019   Anemia    Anxiety    Aortic atherosclerosis ([place])    Aortic valve stenosis 02/10/2018   a.) TTE 02/10/2018: EF 55-60%: mild AS with MPG of 12 mmHg. b.) TTE 04/21/2018: EF 35-40%; mild AS with MPG 8 mmHg. c.) TTE 11/07/2019: EF 50-55%; mild AS with MPG 10 mmHg.   Arthritis    Atrial fibrillation (HCC)    a.) CHA2DS2-VASc = 4 (age, sex, HTN, aortic plaque). b.) Rate/rhythm maintained on oral carvedilol; chronically  anticoagulated on dose reduced apixaban. c.)  Attempted deployment of LAA occlusive device on 01/11/2021; parameters failed and procedure aborted.   Breast cancer, left ([place]) 2000   a.) T2N1M0; ER/PR (+) --> Tx'd with total mastectomy, LN resection, XRT, and chemotherapy   Cancer of right lung ([place]) 07/30/2016   a.) adenocarcinoma; ALK, ROS1, PDL1, BRAF, EGFR all negative.   Chronic diastolic CHF (congestive heart failure) ([place]) 11/26/2021   CKD (chronic kidney disease), stage IV (HCC)    COPD (chronic obstructive pulmonary disease) (HCC)    Dependence on supplemental oxygen    Depression    Diastolic dysfunction 24/40/1027   a.) TTE 02/10/2017: EF 55-60%; G2DD. b.) TTE 04/21/2018: EF 35-40%; mild LA dilation; mod MV regurgitation. c.) TTE 11/07/2019: EF 50-55%; G1DD.   DOE (dyspnea on exertion)    GERD (gastroesophageal reflux disease)    Heart murmur    History of 2019 novel coronavirus disease (COVID-19) 10/14/2019   HLD (hyperlipidemia)    Hypertension    Long term current use of anticoagulant    a.) apixaban   Lymphedema    Personal history of chemotherapy    Personal history of radiation therapy    Respiratory tract infection due to COVID-19 virus 09/27/2019   SBO (small bowel obstruction) ([place]) 11/05/2020   Vitamin D deficiency     Past Surgical History:  Procedure Laterality Date   Breast Biospy Left    [facility]   BREAST SURGERY     CARDIOVERSION N/A 02/23/2022   Procedure: CARDIOVERSION;  Surgeon: Corey Skains, MD;  Location: [facility] ORS;  Service: Cardiovascular;  Laterality: N/A;   COLONOSCOPY N/A 04/30/2018   Procedure: COLONOSCOPY;  Surgeon: Virgel Manifold, MD;  Location: [facility] ENDOSCOPY;  Service: Endoscopy;  Laterality: N/A;   COLONOSCOPY N/A 07/22/2018   Procedure: COLONOSCOPY;  Surgeon: Virgel Manifold, MD;  Location: [facility] ENDOSCOPY;  Service: Endoscopy;  Laterality: N/A;   COLONOSCOPY WITH PROPOFOL N/A 09/21/2021   Procedure: COLONOSCOPY WITH PROPOFOL;   Surgeon: Benjamine Sprague, DO;  Location: [facility] ENDOSCOPY;  Service: General;  Laterality: N/A;   COLONOSCOPY WITH PROPOFOL N/A 03/11/2022   Procedure: COLONOSCOPY WITH PROPOFOL;  Surgeon: Annamaria Helling, DO;  Location: [facility] ENDOSCOPY;  Service: Gastroenterology;  Laterality: N/A;   DILATION AND CURETTAGE OF UTERUS     ELECTROMAGNETIC NAVIGATION BROCHOSCOPY Right 04/11/2016   Procedure: ELECTROMAGNETIC NAVIGATION BRONCHOSCOPY;  Surgeon: Vilinda Boehringer, MD;  Location: [facility] ORS;  Service: Cardiopulmonary;  Laterality: Right;   ESOPHAGOGASTRODUODENOSCOPY N/A 07/22/2018   Procedure: ESOPHAGOGASTRODUODENOSCOPY (EGD);  Surgeon: Virgel Manifold, MD;  Location: [facility] ENDOSCOPY;  Service: Endoscopy;  Laterality: N/A;   ESOPHAGOGASTRODUODENOSCOPY (EGD) WITH PROPOFOL N/A 05/07/2018   Procedure: ESOPHAGOGASTRODUODENOSCOPY (EGD) WITH PROPOFOL;  Surgeon: Lucilla Lame, MD;  Location: [facility] ENDOSCOPY;  Service: Endoscopy;  Laterality: N/A;   ESOPHAGOGASTRODUODENOSCOPY (EGD) WITH PROPOFOL N/A 04/24/2019   Procedure: ESOPHAGOGASTRODUODENOSCOPY (EGD) WITH PROPOFOL;  Surgeon: Jonathon Bellows, MD;  Location: [facility] ENDOSCOPY;  Service: Gastroenterology;  Laterality: N/A;   ESOPHAGOGASTRODUODENOSCOPY (EGD) WITH PROPOFOL N/A 01/12/2020   Procedure: ESOPHAGOGASTRODUODENOSCOPY (EGD) WITH PROPOFOL;  Surgeon: Jonathon Bellows, MD;  Location: [facility] ENDOSCOPY;  Service: Gastroenterology;  Laterality: N/A;   ESOPHAGOGASTRODUODENOSCOPY (EGD) WITH PROPOFOL N/A 04/28/2020   Procedure: ESOPHAGOGASTRODUODENOSCOPY (EGD) WITH PROPOFOL;  Surgeon: Jonathon Bellows, MD;  Location:  [facility] ENDOSCOPY;  Service: Gastroenterology;  Laterality: N/A;   ESOPHAGOGASTRODUODENOSCOPY (EGD) WITH PROPOFOL N/A 03/11/2022   Procedure: ESOPHAGOGASTRODUODENOSCOPY (EGD) WITH PROPOFOL;  Surgeon: Annamaria Helling, DO;  Location: [facility] ENDOSCOPY;  Service: Gastroenterology;  Laterality: N/A;   EUS N/A 05/07/2019   Procedure: FULL UPPER ENDOSCOPIC ULTRASOUND (EUS) RADIAL;   Surgeon: Jola Schmidt, MD;  Location: [facility] ENDOSCOPY;  Service: Endoscopy;  Laterality: N/A;   ILEOSCOPY N/A 07/22/2018   Procedure: ILEOSCOPY THROUGH STOMA;  Surgeon: Virgel Manifold, MD;  Location: [facility] ENDOSCOPY;  Service: Endoscopy;  Laterality: N/A;   ILEOSTOMY     ILEOSTOMY N/A 09/08/2018   Procedure: ILEOSTOMY REVISION POSSIBLE CREATION;  Surgeon: Herbert Pun, MD;  Location: [facility] ORS;  Service: General;  Laterality: N/A;   ILEOSTOMY CLOSURE N/A 08/15/2018   Procedure: DILATION OF ILEOSTOMY STRICTURE;  Surgeon: Herbert Pun, MD;  Location: [facility] ORS;  Service: General;  Laterality: N/A;   LAPAROTOMY Right 05/04/2018   Procedure: EXPLORATORY LAPAROTOMY right colectomy right and left ostomy;  Surgeon: Herbert Pun, MD;  Location: [facility] ORS;  Service: General;  Laterality: Right;   LEFT ATRIAL APPENDAGE OCCLUSION N/A 01/11/2021   Procedure: LEFT ATRIAL APPENDAGE OCCLUSION ([place]); ABORTED PROCEDURE WITHOUT DEVICE BEING IMPLANTED; Location: [facility]; Surgeon: Mylinda Latina, MD   LUNG BIOPSY     MASTECTOMY Left    2000, [place]   ROTATOR CUFF REPAIR Right    [place] TAKEDOWN N/A 10/23/2021   Procedure: XI ROBOTIC ASSISTED ILEOSTOMY TAKEDOWN;  Surgeon: Herbert Pun, MD;  Location: [facility] ORS;  Service: General;  Laterality: N/A;  180 minutes for the surgery part please    Medications Prior to Admission  Medication Sig Dispense Refill Last Dose   amiodarone (PACERONE) 200 MG tablet Take 1 tablet (200 mg total) by mouth 2 (two) times daily. [place]  tablet 0 Past Week   amLODipine (NORVASC) 5 MG tablet Take 5 mg by mouth daily.   Past Week   apixaban (ELIQUIS) 2.5 MG TABS tablet Take 1 tablet (2.5 mg total) by mouth 2 (two) times daily. 60 tablet 0 Past Week   atorvastatin (LIPITOR) 40 MG tablet Take 1 tablet (40 mg total) by mouth daily. 30 tablet 2 Past Week   calcium-vitamin D (OSCAL WITH D) 500-5 MG-MCG tablet Take 1 tablet  by mouth daily.   Past Week   citalopram (CELEXA) 20 MG tablet Take 1 tablet (20 mg total) by mouth daily. (Patient taking differently: Take 40 mg by mouth daily.) 30 tablet 2 Past Week   ferrous sulfate 325 (65 FE) MG tablet Take 325 mg by mouth 2 (two) times daily with a meal.   Past Week   Fluticasone-Umeclidin-Vilant (TRELEGY ELLIPTA) 100-62.5-25 MCG/ACT AEPB Inhale 1 puff into the lungs daily.   Past Week   hydrALAZINE (APRESOLINE) 10 MG tablet Take 1 tablet (10 mg total) by mouth 3 (three) times daily. 90 tablet 0 Past Week   isosorbide dinitrate (ISORDIL) 5 MG tablet Take 1 tablet (5 mg total) by mouth 3 (three) times daily. 90 tablet 0 Past Week   metoprolol tartrate (LOPRESSOR) 25 MG tablet Take 1 tablet (25 mg total) by mouth 2 (two) times daily. 60 tablet 2 Past Week   Multiple Vitamin (MULTIVITAMIN WITH MINERALS) TABS tablet Take 1 tablet by mouth daily.   Past Week   OXYGEN Inhale 4 L into the lungs daily.   Past Week   pantoprazole (PROTONIX) 40 MG tablet Take 1 tablet (40 mg total) by mouth 2 (two) times daily before a meal. 60 tablet 2 Past Week   sodium bicarbonate 650 MG tablet Take 1,300 mg by mouth daily.   Past Week   sucralfate (CARAFATE) 1 GM/10ML suspension Take 0.5 g by mouth 2 (two) times daily.   Past Week   Torsemide 40 MG TABS Take 20 mg by mouth daily. Hold this medication until you see your cardiologist   Past Week   acetaminophen (TYLENOL) 325 MG tablet Take 2 tablets (650 mg total) by mouth every 6 (six) hours as needed for mild pain (or Fever >/= 101). (Patient taking differently: Take 650 mg by mouth every 4 (four) hours as needed for moderate pain.)   prn at prn   albuterol (PROVENTIL HFA;VENTOLIN HFA) 108 (90 Base) MCG/ACT inhaler Inhale 2 puffs into the lungs every 6 (six) hours as needed for wheezing or shortness of breath. 1 Inhaler 2 prn at prn   calcium carbonate (TUMS - DOSED IN MG ELEMENTAL CALCIUM) 500 MG chewable tablet Chew 0.5 tablets by mouth daily as  needed.   prn at prn   loperamide (IMODIUM) 2 MG capsule Take 2 mg by mouth daily as needed for diarrhea or loose stools.   prn at prn   zolpidem (AMBIEN) 5 MG tablet Take 5 mg by mouth at bedtime as needed for sleep.   prn   Social History   Socioeconomic History   Marital status: Divorced    Spouse name: Not on file   Number of children: 3   Years of education: Not on file   Highest education level: Not on file  Occupational History   Occupation: Retired   Tobacco Use   Smoking status: Former    Packs/day: 0.50    Years: 20.00    Total pack years: 10.00    Types: Cigarettes  Quit date: 07/02/2012    Years since quitting: 9.9   Smokeless tobacco: Current    Types: Snuff  Vaping Use   Vaping Use: Never used  Substance and Sexual Activity   Alcohol use: Yes    Alcohol/week: 3.0 standard drinks of alcohol    Types: 3 Cans of beer per week    Comment: Occasionally beer   Drug use: No   Sexual activity: Not Currently  Other Topics Concern   Not on file  Social History Narrative   Not on file   Social Determinants of Health   Financial Resource Strain: High Risk (07/05/2018)   Overall Financial Resource Strain (CARDIA)    Difficulty of Paying Living Expenses: Hard  Food Insecurity: No Food Insecurity (06/19/2022)   Hunger Vital Sign    Worried About Running Out of Food in the Last Year: Never true    Ran Out of Food in the Last Year: Never true  Transportation Needs: No Transportation Needs (02/28/2022)   PRAPARE - Hydrologist (Medical): No    Lack of Transportation (Non-Medical): No  Physical Activity: Insufficiently Active (07/05/2018)   Exercise Vital Sign    Days of Exercise per Week: 2 days    Minutes of Exercise per Session: 30 min  Stress: No Stress Concern Present (07/05/2018)   [place]    Feeling of Stress : Not at all  Social Connections: Somewhat Isolated  (07/05/2018)   Social Connection and Isolation Panel [NHANES]    Frequency of Communication with Friends and Family: More than three times a week    Frequency of Social Gatherings with Friends and Family: More than three times a week    Attends Religious Services: 1 to 4 times per year    Active Member of [employer] or Organizations: No    Attends Archivist Meetings: Never    Marital Status: Divorced  Human resources officer Violence: Not At Risk (07/05/2018)   Humiliation, Afraid, Rape, and Kick questionnaire    Fear of Current or Ex-Partner: No    Emotionally Abused: No    Physically Abused: No    Sexually Abused: No    Family History  Problem Relation Age of Onset   Breast cancer Mother 62   Cancer Mother        Breast    Cirrhosis Father    Breast cancer Paternal Aunt 77   Cancer Maternal Aunt        Breast     PHYSICAL EXAM General: Elderly and thin black female, in no acute distress.  Sitting upright eating breakfast HEENT:  Normocephalic and atraumatic. Neck:  No JVD.  Lungs: Normal respiratory effort on 4 L by nasal cannula.  Decreased breath sounds bilaterally without appreciable crackles or wheezes.   Heart: HRRR . Normal S1 and S2.  3/6 systolic murmur best heard at the RUSB Abdomen: soft, tender to palpation in the RUQ and epigastrium without rebound or guarding.  Msk: Normal strength and tone for age. Extremities: Warm and well perfused. No clubbing, cyanosis.  No peripheral edema.  Neuro: Alert and oriented X 3. Psych:  Answers questions appropriately.   Labs: Basic Metabolic Panel: Recent Labs    06/18/22 0923 06/19/22 0526 06/20/22 0619  NA 142 142 140  K 4.1 3.8 3.6  CL 108 106 106  CO2 _0 GLUCOSE 105* 131* 142*  BUN 28* 33* 33*  CREATININE 1.61* 1.72* 1.63*  CALCIUM 8.9 8.9 8.6*  MG 1.5* 2.2  --   PHOS 3.0 2.5  --     Liver Function Tests: No results for input(s): "AST", "ALT", "ALKPHOS", "BILITOT", "PROT", "ALBUMIN" in the last 72  hours.  No results for input(s): "LIPASE", "AMYLASE" in the last 72 hours. CBC: Recent Labs    06/19/22 0526 06/20/22 0619  WBC 5.6 6.2  HGB 8.0* 8.3*  HCT 24.6* 25.1*  MCV 95.0 94.7  PLT 216 207    Cardiac Enzymes: No results for input(s): "CKTOTAL", "CKMB", "CKMBINDEX", "TROPONINIHS" in the last 72 hours.  BNP: Invalid input(s): "POCBNP" D-Dimer: No results for input(s): "DDIMER" in the last 72 hours.  Hemoglobin A1C: No results for input(s): "HGBA1C" in the last 72 hours.  Fasting Lipid Panel: No results for input(s): "CHOL", "HDL", "LDLCALC", "TRIG", "CHOLHDL", "LDLDIRECT" in the last 72 hours. Thyroid Function Tests: No results for input(s): "TSH", "T4TOTAL", "T3FREE", "THYROIDAB" in the last 72 hours.  Invalid input(s): "FREET3"  Anemia Panel: No results for input(s): "VITAMINB12", "FOLATE", "FERRITIN", "TIBC", "IRON", "RETICCTPCT" in the last 72 hours.  DG Abd 1 View  Result Date: 06/20/2022 CLINICAL DATA:  Abdominal pain.  History of small-bowel obstruction. EXAM: ABDOMEN - 1 VIEW COMPARISON:  CT abdomen pelvis-05/08/2022; chest radiograph-06/19/2022; 06/18/2022 Ultrasound-guided right-sided thoracentesis-06/19/2022 (yielding 1.2 L of pleural fluid). FINDINGS: Paucity of bowel gas precludes evaluation for enteric obstruction. No pneumoperitoneum, pneumatosis or portal venous gas. Surgical clips overlie the lower pelvis bilaterally. Phleboliths and vascular calcifications overlie the lower pelvis bilaterally. Atherosclerotic plaque within the abdominal aorta. Limited visualization of the lower thorax suggests a trace right-sided pleural effusion, similar to prior abdominal CT performed 05/08/2022. No acute osseous abnormalities. Degenerative change of the lower lumbar spine is suspected though incompletely evaluated. IMPRESSION: 1. Paucity of bowel gas precludes evaluation for enteric obstruction. 2. Trace right-sided pleural effusion following recent right-sided  thoracentesis. Electronically Signed   By: Sandi Mariscal M.D.   On: 06/20/2022 12:57   US THORACENTESIS ASP PLEURAL SPACE W/IMG GUIDE  Result Date: 06/19/2022 INDICATION: Patient with history of known AFib, CKD, CHF and history of adenocarcinoma right lung status post radiation. Patient has recurrent right pleural effusion. Request for diagnostic and therapeutic right thoracentesis. EXAM: ULTRASOUND GUIDED DIAGNOSTIC AND THERAPEUTIC RIGHT THORACENTESIS MEDICATIONS: 15 mL 1% lidocaine and topical lidocaine spray COMPLICATIONS: None immediate. PROCEDURE: An ultrasound guided thoracentesis was thoroughly discussed with the patient and questions answered. The benefits, risks, alternatives and complications were also discussed. The patient understands and wishes to proceed with the procedure. Written consent was obtained. Ultrasound was performed to localize and mark an adequate pocket of fluid in the right chest. The area was then prepped and draped in the normal sterile fashion. 1% Lidocaine was used for local anesthesia. Under ultrasound guidance a 6 Fr Safe-T-Centesis catheter was introduced. Thoracentesis was performed. The catheter was removed and a dressing applied. FINDINGS: A total of approximately 1.2 L of clear, amber fluid was removed. Samples were sent to the laboratory as requested by the clinical team. IMPRESSION: Successful ultrasound guided right thoracentesis yielding 1.2 L of pleural fluid. Read by: Narda Rutherford, AGNP-BC Electronically Signed   By: Albin Felling M.D.   On: 06/19/2022 11:20   DG Chest Port 1 View  Result Date: 06/19/2022 CLINICAL DATA:  Status post thoracentesis EXAM: PORTABLE CHEST 1 VIEW COMPARISON:  Chest radiograph 1 day prior FINDINGS: The cardiomediastinal silhouette is stable. The right pleural effusion has decreased since the prior study with improved aeration of the right  base. Nodular appearing opacities in the right hilar and suprahilar region are similar to the prior  study and better assessed on recent CT. There is no new or worsening focal airspace disease. There is no significant left effusion. There is no pneumothorax The bones are stable. IMPRESSION: Decreased right pleural effusion following thoracentesis with improved aeration of the right lung. No pneumothorax. Electronically Signed   By: Valetta Mole M.D.   On: 06/19/2022 10:21     Radiology: DG Abd 1 View  Result Date: 06/20/2022 CLINICAL DATA:  Abdominal pain.  History of small-bowel obstruction. EXAM: ABDOMEN - 1 VIEW COMPARISON:  CT abdomen pelvis-05/08/2022; chest radiograph-06/19/2022; 06/18/2022 Ultrasound-guided right-sided thoracentesis-06/19/2022 (yielding 1.2 L of pleural fluid). FINDINGS: Paucity of bowel gas precludes evaluation for enteric obstruction. No pneumoperitoneum, pneumatosis or portal venous gas. Surgical clips overlie the lower pelvis bilaterally. Phleboliths and vascular calcifications overlie the lower pelvis bilaterally. Atherosclerotic plaque within the abdominal aorta. Limited visualization of the lower thorax suggests a trace right-sided pleural effusion, similar to prior abdominal CT performed 05/08/2022. No acute osseous abnormalities. Degenerative change of the lower lumbar spine is suspected though incompletely evaluated. IMPRESSION: 1. Paucity of bowel gas precludes evaluation for enteric obstruction. 2. Trace right-sided pleural effusion following recent right-sided thoracentesis. Electronically Signed   By: Sandi Mariscal M.D.   On: 06/20/2022 12:57   US THORACENTESIS ASP PLEURAL SPACE W/IMG GUIDE  Result Date: 06/19/2022 INDICATION: Patient with history of known AFib, CKD, CHF and history of adenocarcinoma right lung status post radiation. Patient has recurrent right pleural effusion. Request for diagnostic and therapeutic right thoracentesis. EXAM: ULTRASOUND GUIDED DIAGNOSTIC AND THERAPEUTIC RIGHT THORACENTESIS MEDICATIONS: 15 mL 1% lidocaine and topical lidocaine spray  COMPLICATIONS: None immediate. PROCEDURE: An ultrasound guided thoracentesis was thoroughly discussed with the patient and questions answered. The benefits, risks, alternatives and complications were also discussed. The patient understands and wishes to proceed with the procedure. Written consent was obtained. Ultrasound was performed to localize and mark an adequate pocket of fluid in the right chest. The area was then prepped and draped in the normal sterile fashion. 1% Lidocaine was used for local anesthesia. Under ultrasound guidance a 6 Fr Safe-T-Centesis catheter was introduced. Thoracentesis was performed. The catheter was removed and a dressing applied. FINDINGS: A total of approximately 1.2 L of clear, amber fluid was removed. Samples were sent to the laboratory as requested by the clinical team. IMPRESSION: Successful ultrasound guided right thoracentesis yielding 1.2 L of pleural fluid. Read by: Narda Rutherford, AGNP-BC Electronically Signed   By: Albin Felling M.D.   On: 06/19/2022 11:20   DG Chest Port 1 View  Result Date: 06/19/2022 CLINICAL DATA:  Status post thoracentesis EXAM: PORTABLE CHEST 1 VIEW COMPARISON:  Chest radiograph 1 day prior FINDINGS: The cardiomediastinal silhouette is stable. The right pleural effusion has decreased since the prior study with improved aeration of the right base. Nodular appearing opacities in the right hilar and suprahilar region are similar to the prior study and better assessed on recent CT. There is no new or worsening focal airspace disease. There is no significant left effusion. There is no pneumothorax The bones are stable. IMPRESSION: Decreased right pleural effusion following thoracentesis with improved aeration of the right lung. No pneumothorax. Electronically Signed   By: Valetta Mole M.D.   On: 06/19/2022 10:21   DG Chest Port 1 View  Result Date: 06/18/2022 CLINICAL DATA:  Follow-up study.  Pleural effusion. EXAM: PORTABLE CHEST 1 VIEW COMPARISON:   06/16/2022 and  older exams. FINDINGS: Moderate right pleural effusion obscures hemidiaphragm and portions of the right heart border. There is additional hazy opacity in the right mid to lower lung consistent with a layering component of the pleural effusion and associated compressive atelectasis. Right upper lung is clear. Left lung is hyperexpanded, but clear. Cardiac silhouette mildly enlarged. IMPRESSION: 1. No significant change from the most recent prior study. 2. Moderate right pleural effusion with associated dependent atelectasis. Electronically Signed   By: Lajean Manes M.D.   On: 06/18/2022 10:31   CT Angio Chest PE W and/or Wo Contrast  Result Date: 06/16/2022 CLINICAL DATA:  Pulmonary embolism (PE) suspected, positive D-dimer Increasing shortness of breath.  History of COPD. EXAM: CT ANGIOGRAPHY CHEST WITH CONTRAST TECHNIQUE: Multidetector CT imaging of the chest was performed using the standard protocol during bolus administration of intravenous contrast. Multiplanar CT image reconstructions and MIPs were obtained to evaluate the vascular anatomy. RADIATION DOSE REDUCTION: This exam was performed according to the departmental dose-optimization program which includes automated exposure control, adjustment of the mA and/or kV according to patient size and/or use of iterative reconstruction technique. CONTRAST:  76m OMNIPAQUE IOHEXOL 350 MG/ML SOLN COMPARISON:  Radiograph earlier today. Most recent chest CT 02/21/2022, additional chest CTs reviewed. Included lung bases from prior abdominal CTs reviewed. FINDINGS: Cardiovascular: There are no filling defects within the pulmonary arteries to suggest pulmonary embolus. Chronically dilated main pulmonary artery at 3.3 cm. Right left main pulmonary arteries are also dilated. Advanced aortic and branch atherosclerosis. Cannot assess for dissection due to phase of contrast tailored to pulmonary artery assessment. There is contrast refluxing into the hepatic  veins and IVC. Chronic cardiomegaly. Coronary artery calcifications. No pericardial effusion. Mediastinum/Nodes: Soft tissue density both hila likely represent lymphoid tissue. No mediastinal adenopathy. The previous prominent subcarinal node is not well-defined on the current exam. Small hiatal hernia again seen. Lungs/Pleura: Moderate to large right pleural effusion, chronic, increased from prior CT as well as most recent abdominal CT 05/08/2022. There is associated compressive atelectasis in the lower lobe. Trace left pleural effusion with atelectasis in the lower lobe. Spiculated nodular opacity in the right upper lobe measures 2.5 x 1.8 cm, series 5, image 55, previously 2.5 x 1.5 cm. Masslike opacity in the anterior right upper lobe extending towards the pleural surface has slightly diminished currently measuring 3.5 x 1.9 cm, series 5, image 65 previously 3.8 x 2.1 cm. Moderate emphysema. Marked bronchial thickening particularly in the lower lobes with areas of mucous plugging and mucoid impaction. Bronchial thickening that has significantly progressed. Clustered nodular opacities in the superior segment of the left lower lobe measuring up to 9 mm, series 5, image 60, new. Chronic left apical scarring which is partially calcified. Upper Abdomen: Contrast refluxes into the hepatic veins and IVC. Atherosclerotic calcifications of the abdominal aorta, including coarse calcifications at the diaphragmatic hiatus. Dense aortic branch calcifications in the included upper abdomen. No free fluid or acute findings. Musculoskeletal: Chronic L2 superior endplate compression fracture. Left anterior rib fractures are remote. No acute osseous findings. There is minimal edema in the right lower lateral chest wall subcutaneous tissues. Review of the MIP images confirms the above findings. IMPRESSION: 1. No pulmonary embolus. 2. Moderate to large right pleural effusion, chronic, increased from prior CT as well as most recent  abdominal CT 05/08/2022. There is associated compressive atelectasis in the right lower lobe. Trace left pleural effusion with atelectasis in the lower lobes. 3. Marked bronchial thickening particularly in the lower lobes  with areas of mucous plugging and mucoid impaction. Progressive bronchial inflammation from prior. 4. Clustered nodular opacities in the superior segment of the left lower lobe measuring up to 9 mm, new from prior CT. This may be infectious, inflammatory, or neoplastic. Recommend follow-up CT in 3 months or PET characterization. 5. Areas of masslike opacity in the right upper lobe are stable or slightly improved from prior exam. This may represent treated disease. Attention at follow-up recommended. 6. Chronic cardiomegaly. Coronary artery calcifications. 7. Chronically dilated main pulmonary artery consistent with pulmonary arterial hypertension. Aortic Atherosclerosis (ICD10-I70.0) and Emphysema (ICD10-J43.9). Electronically Signed   By: Keith Rake M.D.   On: 06/16/2022 23:27   DG Chest Portable 1 View  Result Date: 06/16/2022 CLINICAL DATA:  Shortness of breath EXAM: PORTABLE CHEST 1 VIEW COMPARISON:  Previous studies including the examination of 05/22/2022 FINDINGS: Transverse diameter of heart is increased. Central pulmonary vessels are more prominent. There is prominence of interstitial markings in the parahilar regions and lower lung fields. There is small right pleural effusion. Left lateral CP angle is clear. There is no pneumothorax. IMPRESSION: Cardiomegaly. Central pulmonary vessels are more prominent suggesting CHF. Small right pleural effusion. Electronically Signed   By: Elmer Picker M.D.   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 Patient has recurrent right pleural effusion.  Request for therapeutic right thoracentesis. EXAM: ULTRASOUND GUIDED THERAPEUTIC RIGHT THORACENTESIS MEDICATIONS: 10 mL 1 % lidocaine COMPLICATIONS: None immediate. PROCEDURE: An ultrasound guided thoracentesis was thoroughly discussed with the patient and questions answered. The benefits, risks, alternatives and complications were also discussed. The patient understands and wishes to proceed with the procedure. Written consent was obtained. Ultrasound was performed to localize and mark an adequate pocket of fluid in the right chest. The area was then prepped and draped in the normal sterile fashion. 1% Lidocaine was used for local anesthesia. Under ultrasound guidance a 6 Fr Safe-T-Centesis catheter was introduced. Thoracentesis was performed. The catheter was removed and a dressing applied. FINDINGS: A total of approximately 900 cc of clear, amber fluid was removed. IMPRESSION: Successful ultrasound guided right thoracentesis yielding 900 cc of pleural fluid. Read by: Narda Rutherford, AGNP-BC Electronically Signed   By: Albin Felling M.D.   On: 05/23/2022 08:49   DG Chest Port 1 View  Result Date: 05/22/2022 CLINICAL DATA:  Right-sided thoracentesis. EXAM: PORTABLE CHEST 1 VIEW COMPARISON:  Earlier same day. FINDINGS: Significant interval improvement patient's right-sided effusion with only small amount of residual right pleural fluid noted. Patient is post right-sided thoracentesis. No evidence of pneumothorax. Stable density over the right mid to upper lung as well as stable prominence of the right hilum. Left lung is clear. Cardiomediastinal silhouette and remainder of the exam is unchanged. IMPRESSION: 1. Significant interval improvement in patient's right-sided pleural effusion post thoracentesis. No pneumothorax. Minimal residual right pleural fluid. 2. Stable changes over the right mid to upper lung and stable prominence of the right hilum. Electronically Signed   By: Marin Olp M.D.   On: 05/22/2022  17:58   DG Chest Portable 1 View  Result Date: 05/22/2022 CLINICAL DATA:  SOB EXAM: PORTABLE CHEST 1 VIEW COMPARISON:  Chest x-ray 05/08/2022, CT chest 02/21/2022 FINDINGS: The heart and mediastinal contours are unchanged. Atherosclerotic plaque. No focal consolidation. Chronic coarsened markings with no overt pulmonary edema. Interval increase in  a small to moderate volume right pleural effusion. No pneumothorax. No acute osseous abnormality. IMPRESSION: 1. Interval increase in a small to moderate right pleural effusion. 2. Aortic Atherosclerosis (ICD10-I70.0) and Emphysema (ICD10-J43.9). Electronically Signed   By: Iven Finn M.D.   On: 05/22/2022 16:25    ECHO 01/25/2022 IMPRESSIONS     1. Ant/apical/setal hpo.   2. Left ventricular ejection fraction, by estimation, is 35 to 40%. The  left ventricle has moderately decreased function. The left ventricle  demonstrates regional wall motion abnormalities (see scoring  diagram/findings for description). The left  ventricular internal cavity size was mildly dilated. Left ventricular  diastolic parameters are consistent with Grade II diastolic dysfunction  (pseudonormalization).   3. Right ventricular systolic function is low normal. The right  ventricular size is normal.   4. The mitral valve is normal in structure. Moderate mitral valve  regurgitation.   5. Tricuspid valve regurgitation is moderate to severe.   6. The aortic valve is normal in structure. Aortic valve regurgitation is  not visualized. Moderate aortic valve stenosis.   FINDINGS   Left Ventricle: Left ventricular ejection fraction, by estimation, is 35  to 40%. The left ventricle has moderately decreased function. The left  ventricle demonstrates regional wall motion abnormalities. The left  ventricular internal cavity size was  mildly dilated. There is borderline concentric left ventricular  hypertrophy. Left ventricular diastolic parameters are consistent with   Grade II diastolic dysfunction (pseudonormalization).   Right Ventricle: The right ventricular size is normal. No increase in  right ventricular wall thickness. Right ventricular systolic function is  low normal.   Left Atrium: Left atrial size was normal in size.   Right Atrium: Right atrial size was not assessed.   Pericardium: There is no evidence of pericardial effusion.   Mitral Valve: The mitral valve is normal in structure. Moderate mitral  valve regurgitation.   Tricuspid Valve: The tricuspid valve is grossly normal. Tricuspid valve  regurgitation is moderate to severe.   Aortic Valve: The aortic valve is normal in structure. Aortic valve  regurgitation is not visualized. Moderate aortic stenosis is present.  Aortic valve mean gradient measures 6.3 mmHg. Aortic valve peak gradient  measures 11.6 mmHg. Aortic valve area, by  VTI measures 0.80 cm.   Pulmonic Valve: The pulmonic valve was normal in structure. Pulmonic valve  regurgitation is trivial.   Aorta: The ascending aorta was not well visualized.   IAS/Shunts: No atrial level shunt detected by color flow Doppler.   Additional Comments: Ant/apical/setal hpo.      LEFT VENTRICLE  PLAX 2D  LVIDd:         3.90 cm  LVIDs:         3.00 cm  LV PW:         1.30 cm  LV IVS:        0.85 cm  LVOT diam:     2.00 cm  LV SV:         20  LV SV Index:   12  LVOT Area:     3.14 cm     LV Volumes (MOD)  LV vol d, MOD A2C: 35.6 ml  LV vol d, MOD A4C: 62.7 ml  LV vol s, MOD A2C: 23.2 ml  LV vol s, MOD A4C: 42.8 ml  LV SV MOD A2C:     12.4 ml  LV SV MOD A4C:     62.7 ml  LV SV MOD BP:  18.0 ml   RIGHT VENTRICLE  RV Basal diam:  3.70 cm  RV S prime:     10.20 cm/s  TAPSE (M-mode): 1.1 cm   LEFT ATRIUM           Index        RIGHT ATRIUM           Index  LA diam:      3.20 cm 2.00 cm/m   RA Area:     21.70 cm  LA Vol (A2C): 48.6 ml 30.37 ml/m  RA Volume:   71.60 ml  44.74 ml/m  LA Vol (A4C): 15.7 ml 9.81  ml/m   AORTIC VALVE                     PULMONIC VALVE  AV Area (Vmax):    0.81 cm      PV Vmax:        0.39 m/s  AV Area (Vmean):   0.84 cm      PV Vmean:       27.500 cm/s  AV Area (VTI):     0.80 cm      PV VTI:         0.051 m  AV Vmax:           170.00 cm/s   PV Peak grad:   0.6 mmHg  AV Vmean:          114.667 cm/s  PV Mean grad:   0.0 mmHg  AV VTI:            0.247 m       RVOT Peak grad: 2 mmHg  AV Peak Grad:      11.6 mmHg  AV Mean Grad:      6.3 mmHg  LVOT Vmax:         43.70 cm/s  LVOT Vmean:        30.800 cm/s  LVOT VTI:          0.063 m  LVOT/AV VTI ratio: 0.25     AORTA  Ao Root diam: 3.00 cm   MITRAL VALVE               TRICUSPID VALVE  MV Area (PHT): 8.34 cm    TR Peak grad:   42.5 mmHg  MV Decel Time: 91 msec     TR Vmax:        326.00 cm/s  MV E velocity: 70.30 cm/s  MV A velocity: 57.00 cm/s  SHUNTS  MV E/A ratio:  1.23        Systemic VTI:  0.06 m                             Systemic Diam: 2.00 cm                             Pulmonic VTI:  0.086 m   Yolonda Kida MD  Electronically signed by Yolonda Kida MD  Signature Date/Time: 01/30/2022/1:54:44 PM   TELEMETRY reviewed by me (LT) 06/21/2022 : NSR rate 60s   EKG reviewed by me: NSR 85 PVCs  Data reviewed by me (LT) 06/21/2022: hospitalist progress note, telemetry, vitals, ordered bmp  ASSESSMENT AND PLAN:  Danielle [name] is a 89yoF with a PMH of advanced COPD on 3L O2, paroxysmal atrial fibrillation/flutter (dose reduced eliquis held due to recent  epistaxis), HFrEF (LVEF 35-40%, g2dd, mod-sev TR, mod AS, mod MS 01/2022), recurrent Right pleural effusion, CKD (baseline Cr. 2-2.5), remote history of lung cancer s/p radiation, esophageal cancer s/p radiation, MGUS, h/o R colectomy with ileostomy s/p ileostomy takedown 11/2021, who presented to [facility] ED 06/16/2022 with shortness of breath x1 week, pleuritic chest discomfort with associated palpitations and dizziness.  She was initially requiring BiPAP, has  now been weaned to 6 L by nasal cannula.  Urinary strep pneumo antigen positive, she was started on empiric ceftriaxone and azithromycin.  Cardiology is consulted for assistance with her A-fib with RVR and heart failure.  #Acute on chronic hypoxic respiratory failure Multifactorial in the setting of strep pneumo PNA, advanced COPD, and heart failure -On empiric antibiotics for treatment of pneumonia -remains on 4 L today.  #Acute on chronic HFrEF (EF 35-40% 01/2022) #Recurrent right pleural effusion BNP elevated on admission at 2300, increasing to 3800 on 9/5. S/p right thoracentesis yielding 1.2 L clear amber fluid on 9/5.  Not grossly clinically volume overloaded appearing. -S/p IV Lasix 40 mg x 1, 60 mg x 3 with clinical improvement in her peripheral edema.  I's and O's not accurate. -continue torsemide 20 mg once daily today -Continue GDMT with metoprolol 25 twice daily -continue hydralazine/Isordil 3 times daily for GDMT and afterload reduction. -Continue atorvastatin 40 mg daily -No further cardiac diagnostics necessary, okay for discharge from a cardiac standpoint today.  #Paroxysmal A-fib with RVR Reportedly in A-fib with RVR on admission.  Currently in sinus rhythm with heart rates in the 60s. -Continue amiodarone 200 mg once a day -Continue metoprolol 25 mg twice daily -Continue Eliquis 2.5 mg twice a day for stroke risk reduction.  CHA2DS2-VASc 4   #CKD 3 Renal function stable at 1.77 and GFR 31 today.    #Elevated troponin Trended 22-20-20 likely secondary to renal dysfunction versus demand and not ACS.  This patient's plan of care was discussed and created with Dr. [name] and he is in agreement.  Signed: Tristan Schroeder , PA-C 06/21/2022, 7:50 AM [facility]   The patient has had maximal hospital benefit from the treatment of her acute on chronic systolic dysfunction congestive heart failure right pleural effusion atrial fibrillation chronic kidney  disease without evidence of myocardial infarction.  She remains on oxygen at this time for which she is stable.  She has been minimally ambulating without any cardiovascular symptoms.  It appears that she is okay for discharged home if ambulating well on current medical regimen.  The patient has been interviewed and examined. I agree with assessment and plan above. Serafina Royals MD [facility]

## 2022-06-22 LAB — BODY FLUID CULTURE W GRAM STAIN: Culture: NO GROWTH

## 2022-06-23 LAB — CULTURE, BLOOD (ROUTINE X 2)
Culture: NO GROWTH
Culture: NO GROWTH
Special Requests: ADEQUATE
Special Requests: ADEQUATE

## 2022-06-25 NOTE — Progress Notes (Unsigned)
Patient ID: Danielle [name], female    DOB: 06-16-1956, 66 y.o.   MRN: [id number]  HPI  Danielle [name] is a 66 y/o female with a history of breast/ lung/ esophageal cancer, DM, HTN, COPD, anxiety, depression, lymphedema, previous tobacco use and chronic heart failure.   Echo report from 01/30/22 reviewed and showed an EF of 35-40% along with moderate MR, moderate/severe TR and moderate AS. Echo report from 11/07/19 reviewed and showed an EF of 50-55% along with mild TR and moderately elevated PA pressure. Echo report from 04/21/18 reviewed and showed an EF of 35-40% along with mild AS/ trivial AR and moderate MR.   Admitted 06/16/22 due to worsening shortness of breath for [redacted] week along with pleuritic chest pain associated with palpitations and dizziness. Cardiology consult obtained. Initially needed bipap with transition to oxygen via nasal cannula. Initially given IV lasix and then oral diuretics. CTA was negative for PE but did show moderate to large right pleural effusion. Needed cardizem infusion due to AF RVR. Underwent IR guided right thoracentesis with removal of 1.2 L of clear fluid. Finished antibiotics for strep pneumonia. Discharged after 5 days. Admitted 05/22/22 due to SOB. Had right thoracentesis. Cardiology consult obtained. Initially given IV lasix with transition to oral diuretics. Discharged after 4 days. Admitted 05/08/22 due to worsening shortness of breath from her baseline with recurrent moderate right pleural effusion. Had profound epistaxis. Pulmonology/ENT consults obtained. Eliquis was stopped. Had right sided thoracentesis done with removal of 1.2L. Discharged after 4 days. Was in the ED 04/30/22 due to palpitations with AF RVR. IV lopressor given. Was in the ED 04/13/22 due to SOB. Abd/pelvic CT done. Given IV lasix and then released. Admitted 03/09/22 due to GIB. Given IV protonix drip. GI consult obtained. Upper.lower endoscopy done which showed diverticulosis and hemorrhoids. No active  bleeding. Eliquis held but then restarted. Discharged after 2 days. Admitted 02/21/22 due to shortness of breath and found to be in AF RVR. Cardiology consult obtained. Successful cardioversion done. Chest CT showed stable right-sided pleural effusion. Underwent thoracentesis, 650 cc was removed. Discharged after 3 days.   She presents today for a follow-up visit with a chief complaint of moderate shortness of breath with minimal exertion. Describes this as chronic in nature. She has associated fatigue, blurry vision, dizziness and headaches along with this. She denies any difficulty sleeping, abdominal distention, palpitations, pedal edema, chest pain, cough or weight gain.   Is having issues with gout in her feet and left hand and has to get f/u appointment with her PCP scheduled. Has noted swelling in her left hand which is new although she does get swelling in her left arm because of having lymph nodes removed on that side due to breast cancer.   Past Medical History:  Diagnosis Date   Acute respiratory failure due to COVID-19 (HCC) 09/27/2019   Anemia    Anxiety    Aortic atherosclerosis (HCC)    Aortic valve stenosis 02/10/2018   a.) TTE 02/10/2018: EF 55-60%: mild AS with MPG of 12 mmHg. b.) TTE 04/21/2018: EF 35-40%; mild AS with MPG 8 mmHg. c.) TTE 11/07/2019: EF 50-55%; mild AS with MPG 10 mmHg.   Arthritis    Atrial fibrillation (HCC)    a.) CHA2DS2-VASc = 4 (age, sex, HTN, aortic plaque). b.) Rate/rhythm maintained on oral carvedilol; chronically anticoagulated on dose reduced apixaban. c.)  Attempted deployment of LAA occlusive device on 01/11/2021; parameters failed and procedure aborted.   Breast cancer, left (HCC)  2000   a.) T2N1M0; ER/PR (+) --> Tx'd with total mastectomy, LN resection, XRT, and chemotherapy   Cancer of right lung ([place]) 07/30/2016   a.) adenocarcinoma; ALK, ROS1, PDL1, BRAF, EGFR all negative.   Chronic diastolic CHF (congestive heart failure) ([place]) 11/26/2021    CKD (chronic kidney disease), stage IV (HCC)    COPD (chronic obstructive pulmonary disease) (HCC)    Dependence on supplemental oxygen    Depression    Diastolic dysfunction 63/87/5643   a.) TTE 02/10/2017: EF 55-60%; G2DD. b.) TTE 04/21/2018: EF 35-40%; mild LA dilation; mod MV regurgitation. c.) TTE 11/07/2019: EF 50-55%; G1DD.   DOE (dyspnea on exertion)    GERD (gastroesophageal reflux disease)    Heart murmur    History of 2019 novel coronavirus disease (COVID-19) 10/14/2019   HLD (hyperlipidemia)    Hypertension    Long term current use of anticoagulant    a.) apixaban   Lymphedema    Personal history of chemotherapy    Personal history of radiation therapy    Respiratory tract infection due to COVID-19 virus 09/27/2019   SBO (small bowel obstruction) ([place]) 11/05/2020   Vitamin D deficiency    Past Surgical History:  Procedure Laterality Date   Breast Biospy Left    [facility]   BREAST SURGERY     CARDIOVERSION N/A 02/23/2022   Procedure: CARDIOVERSION;  Surgeon: Corey Skains, MD;  Location: [facility] ORS;  Service: Cardiovascular;  Laterality: N/A;   COLONOSCOPY N/A 04/30/2018   Procedure: COLONOSCOPY;  Surgeon: Virgel Manifold, MD;  Location: [facility] ENDOSCOPY;  Service: Endoscopy;  Laterality: N/A;   COLONOSCOPY N/A 07/22/2018   Procedure: COLONOSCOPY;  Surgeon: Virgel Manifold, MD;  Location: [facility] ENDOSCOPY;  Service: Endoscopy;  Laterality: N/A;   COLONOSCOPY WITH PROPOFOL N/A 09/21/2021   Procedure: COLONOSCOPY WITH PROPOFOL;  Surgeon: Benjamine Sprague, DO;  Location: [facility] ENDOSCOPY;  Service: General;  Laterality: N/A;   COLONOSCOPY WITH PROPOFOL N/A 03/11/2022   Procedure: COLONOSCOPY WITH PROPOFOL;  Surgeon: Annamaria Helling, DO;  Location: [facility] ENDOSCOPY;  Service: Gastroenterology;  Laterality: N/A;   DILATION AND CURETTAGE OF UTERUS     ELECTROMAGNETIC NAVIGATION BROCHOSCOPY Right 04/11/2016   Procedure: ELECTROMAGNETIC NAVIGATION BRONCHOSCOPY;  Surgeon:  Vilinda Boehringer, MD;  Location: [facility] ORS;  Service: Cardiopulmonary;  Laterality: Right;   ESOPHAGOGASTRODUODENOSCOPY N/A 07/22/2018   Procedure: ESOPHAGOGASTRODUODENOSCOPY (EGD);  Surgeon: Virgel Manifold, MD;  Location: [facility] ENDOSCOPY;  Service: Endoscopy;  Laterality: N/A;   ESOPHAGOGASTRODUODENOSCOPY (EGD) WITH PROPOFOL N/A 05/07/2018   Procedure: ESOPHAGOGASTRODUODENOSCOPY (EGD) WITH PROPOFOL;  Surgeon: Lucilla Lame, MD;  Location: [facility] ENDOSCOPY;  Service: Endoscopy;  Laterality: N/A;   ESOPHAGOGASTRODUODENOSCOPY (EGD) WITH PROPOFOL N/A 04/24/2019   Procedure: ESOPHAGOGASTRODUODENOSCOPY (EGD) WITH PROPOFOL;  Surgeon: Jonathon Bellows, MD;  Location[facility]COPY;  Service: Gastroenterology;  Laterality: N/A;   ESOPHAGOGASTRODUODENOSCOPY (EGD) WITH PROPOFOL N/A 01/12/2020   Procedure: ESOPHAGOGASTRODUODENOSCOPY (EGD) WITH PROPOFOL;  Surgeon: Jonathon Bellows, MD;  Location: [facility] ENDOSCOPY;  Service: Gastroenterology;  Laterality: N/A;   ESOPHAGOGASTRODUODENOSCOPY (EGD) WITH PROPOFOL N/A 04/28/2020   Procedure: ESOPHAGOGASTRODUODENOSCOPY (EGD) WITH PROPOFOL;  Surgeon: Jonathon Bellows, MD;  Location: [facility] ENDOSCOPY;  Service: Gastroenterology;  Laterality: N/A;   ESOPHAGOGASTRODUODENOSCOPY (EGD) WITH PROPOFOL N/A 03/11/2022   Procedure: ESOPHAGOGASTRODUODENOSCOPY (EGD) WITH PROPOFOL;  Surgeon: Annamaria Helling, DO;  Location: [facility] ENDOSCOPY;  Service: Gastroenterology;  Laterality: N/A;   EUS N/A 05/07/2019   Procedure: FULL UPPER ENDOSCOPIC ULTRASOUND (EUS) RADIAL;  Surgeon: Jola Schmidt, MD;  Location: [facility] ENDOSCOPY;  Service: Endoscopy;  Laterality:  N/A;   ILEOSCOPY N/A 07/22/2018   Procedure: ILEOSCOPY THROUGH STOMA;  Surgeon: Virgel Manifold, MD;  Location: [facility] ENDOSCOPY;  Service: Endoscopy;  Laterality: N/A;   ILEOSTOMY     ILEOSTOMY N/A 09/08/2018   Procedure: ILEOSTOMY REVISION POSSIBLE CREATION;  Surgeon: Herbert Pun, MD;  Location: [facility] ORS;  Service: General;   Laterality: N/A;   ILEOSTOMY CLOSURE N/A 08/15/2018   Procedure: DILATION OF ILEOSTOMY STRICTURE;  Surgeon: Herbert Pun, MD;  Location: [facility] ORS;  Service: General;  Laterality: N/A;   LAPAROTOMY Right 05/04/2018   Procedure: EXPLORATORY LAPAROTOMY right colectomy right and left ostomy;  Surgeon: Herbert Pun, MD;  Location: [facility] ORS;  Service: General;  Laterality: Right;   LEFT ATRIAL APPENDAGE OCCLUSION N/A 01/11/2021   Procedure: LEFT ATRIAL APPENDAGE OCCLUSION ([place]); ABORTED PROCEDURE WITHOUT DEVICE BEING IMPLANTED; Location: [facility]; Surgeon: Mylinda Latina, MD   LUNG BIOPSY     MASTECTOMY Left    2000, [place] Right    [place]   XI ROBOTIC ASSISTED COLOSTOMY TAKEDOWN N/A 10/23/2021   Procedure: XI ROBOTIC ASSISTED ILEOSTOMY TAKEDOWN;  Surgeon: Herbert Pun, MD;  Location: [facility] ORS;  Service: General;  Laterality: N/A;  180 minutes for the surgery part please   Family History  Problem Relation Age of Onset   Breast cancer Mother 76   Cancer Mother        Breast    Cirrhosis Father    Breast cancer Paternal Aunt 9   Cancer Maternal Aunt        Breast    Social History   Tobacco Use   Smoking status: Former    Packs/day: 0.50    Years: 20.00    Total pack years: 10.00    Types: Cigarettes    Quit date: 07/02/2012    Years since quitting: 9.9   Smokeless tobacco: Current    Types: Snuff  Substance Use Topics   Alcohol use: Yes    Alcohol/week: 3.0 standard drinks of alcohol    Types: 3 Cans of beer per week    Comment: Occasionally beer   No Known Allergies  Prior to Admission medications   Medication Sig Start Date End Date Taking? Authorizing Provider  acetaminophen (TYLENOL) 325 MG tablet Take 2 tablets (650 mg total) by mouth every 6 (six) hours as needed for mild pain (or Fever >/= 101). Patient taking differently: Take 650 mg by mouth every 4 (four) hours as needed for moderate pain. 12/24/18  Yes Gouru, Illene Silver,  MD  albuterol (PROVENTIL HFA;VENTOLIN HFA) 108 (90 Base) MCG/ACT inhaler Inhale 2 puffs into the lungs every 6 (six) hours as needed for wheezing or shortness of breath. 04/24/18  Yes Demetrios Loll, MD  amiodarone (PACERONE) 200 MG tablet Take 1 tablet (200 mg total) by mouth daily. 06/21/22  Yes Lorella Nimrod, MD  apixaban (ELIQUIS) 2.5 MG TABS tablet Take 1 tablet (2.5 mg total) by mouth 2 (two) times daily. 05/26/22  Yes Wyvonnia Dusky, MD  atorvastatin (LIPITOR) 40 MG tablet Take 1 tablet (40 mg total) by mouth daily. 02/03/22  Yes Nicole Kindred A, DO  calcium carbonate (TUMS - DOSED IN MG ELEMENTAL CALCIUM) 500 MG chewable tablet Chew 0.5 tablets by mouth daily as needed.   Yes [provider]  calcium-vitamin D (OSCAL WITH D) 500-5 MG-MCG tablet Take 1 tablet by mouth daily.   Yes [provider]  citalopram (CELEXA) 20 MG tablet Take 1 tablet (20 mg total) by mouth daily.  Patient taking differently: Take 40 mg by mouth daily. 02/04/22  Yes Nicole Kindred A, DO  ferrous sulfate 325 (65 FE) MG tablet Take 325 mg by mouth 2 (two) times daily with a meal.   Yes [provider]  Fluticasone-Umeclidin-Vilant (TRELEGY ELLIPTA) 100-62.5-25 MCG/ACT AEPB Inhale 1 puff into the lungs daily. 03/05/22  Yes [provider]  hydrALAZINE (APRESOLINE) 10 MG tablet Take 1 tablet (10 mg total) by mouth 3 (three) times daily. 05/26/22  Yes Wyvonnia Dusky, MD  isosorbide dinitrate (ISORDIL) 5 MG tablet Take 1 tablet (5 mg total) by mouth 3 (three) times daily. 05/26/22  Yes Wyvonnia Dusky, MD  loperamide (IMODIUM) 2 MG capsule Take 2 mg by mouth daily as needed for diarrhea or loose stools.   Yes [provider]  metoprolol tartrate (LOPRESSOR) 25 MG tablet Take 1 tablet (25 mg total) by mouth 2 (two) times daily. 02/03/22  Yes Ezekiel Slocumb, DO  Multiple Vitamin (MULTIVITAMIN WITH MINERALS) TABS tablet Take 1 tablet by mouth daily.   Yes [provider]  OXYGEN Inhale 4 L into the lungs daily.   Yes [provider]  pantoprazole (PROTONIX) 40 MG tablet Take 1 tablet (40 mg total) by mouth 2 (two) times daily before a meal. 08/05/20  Yes Pokhrel, L[place], MD  simethicone (MYLICON) 80 MG chewable tablet Chew 1 tablet (80 mg total) by mouth every 6 (six) hours as needed for flatulence. 06/21/22  Yes Lorella Nimrod, MD  sodium bicarbonate 650 MG tablet Take 1,300 mg by mouth daily.   Yes [provider]  sucralfate (CARAFATE) 1 GM/10ML suspension Take 0.5 g by mouth 2 (two) times daily.   Yes [provider]  Torsemide 40 MG TABS Take 20 mg by mouth daily. Hold this medication until you see your cardiologist 05/26/22  Yes Wyvonnia Dusky, MD  zolpidem (AMBIEN) 5 MG tablet Take 5 mg by mouth at bedtime as needed for sleep. 06/23/21  Yes [provider]    Review of Systems  Constitutional:  Positive for fatigue (easily). Negative for appetite change.  HENT:  Positive for rhinorrhea. Negative for congestion and nosebleeds.   Eyes:  Positive for visual disturbance (blurry vision).  Respiratory:  Positive for shortness of breath (easily). Negative for cough.   Cardiovascular:  Negative for chest pain, palpitations and leg swelling.  Gastrointestinal:  Negative for abdominal distention, abdominal pain and diarrhea.  Genitourinary: Negative.   Musculoskeletal:  Positive for arthralgias (feet pain (gout)). Negative for back pain and neck pain.  Skin: Negative.   Neurological:  Positive for dizziness (intermittent) and headaches (intermittent). Negative for light-headedness.  Hematological:  Negative for adenopathy. Does not bruise/bleed easily.  Psychiatric/Behavioral:  Negative for dysphoric mood and sleep disturbance (sleeping on 1 pillow with oxygen). The patient is not nervous/anxious.    Vitals:   06/26/22 1036 06/26/22 1045  BP: 134/85   Pulse: 70   Resp: 16   SpO2: 90% 100%  Weight: 112 lb 6 oz (51 kg)    Height: $Remove'5\' 6"'NKAiyir$  (1.676 m)    Wt Readings from Last 3 Encounters:  06/26/22 112 lb 6 oz (51 kg)  06/18/22 111 lb 12.8 oz (50.7 kg)  05/26/22 120 lb 13 oz (54.8 kg)   Lab Results  Component Value Date   CREATININE 1.77 (H) 06/21/2022   CREATININE 1.63 (H) 06/20/2022   CREATININE 1.72 (H) 06/19/2022   Physical Exam Vitals and nursing note reviewed.  Constitutional:  Appearance: She is well-developed.  HENT:     Head: Normocephalic and atraumatic.  Neck:     Vascular: No JVD.  Cardiovascular:     Rate and Rhythm: Regular rhythm.  Pulmonary:     Effort: Pulmonary effort is normal. No respiratory distress.     Breath sounds: No wheezing or rales.  Abdominal:     General: There is no distension.     Palpations: Abdomen is soft.  Musculoskeletal:        General: No tenderness.     Cervical back: Normal range of motion and neck supple.     Right lower leg: No tenderness. Edema (trace pitting) present.     Left lower leg: No tenderness. Edema (trace pitting) present.     Comments: Nonpitting edema in left hand  Skin:    General: Skin is warm and dry.  Neurological:     Mental Status: She is alert and oriented to person, place, and time.     Comments: + radial pulses bilaterally  Psychiatric:        Behavior: Behavior normal.    Assessment & Plan:  1: Chronic heart failure with reduced ejection fraction- - NYHA class III - euvolemic today - weighing daily; reminded to call for an overnight weight gain of >2 pounds or a weekly weight gain of >5 pounds - weight down 1 pound from last visit here 5 weeks ago - on GDMT of metoprolol (although tartrate) - consider GDMT if renal stable remains stable - drinks 36 ounces of beer over the weekends - encouraged to keep daily fluid intake to around 30 ounces; per nephrology  - not adding salt but does eat out often; reviewed how to make low sodium choices when eating out - BNP 06/20/22 was 3240.6 - recheck BNP today  2: HTN- -  BP looks good (134/85) - sees PCP (@ [facility]) & has to schedule f/u appointment to address her gout - BMP reviewed 06/21/22 and showed sodium 143, potassium 3.3, creatinine 1.77 and GFR 31 - saw nephrology Holley Raring) 04/19/22 - recheck BMP today  3: Atrial fibrillation- - saw cardiology Petra Kuba) 05/22/22 - previous successful cardioversion  4: COPD- - saw pulmonology Raul Del) 05/02/22 - using trelegy and albuterol - wearing oxygen at 3L around the clock   Medication list reviewed.   Return in 2 months, sooner if needed.

## 2022-06-26 ENCOUNTER — Encounter: Payer: Self-pay | Admitting: Family

## 2022-06-26 ENCOUNTER — Other Ambulatory Visit
Admission: RE | Admit: 2022-06-26 | Discharge: 2022-06-26 | Disposition: A | Discharge status: Home / Self Care | Payer: Medicare Other | Source: Ambulatory Visit | Attending: Family | Admitting: Family

## 2022-06-26 ENCOUNTER — Telehealth: Payer: Self-pay | Admitting: Family

## 2022-06-26 ENCOUNTER — Ambulatory Visit (HOSPITAL_BASED_OUTPATIENT_CLINIC_OR_DEPARTMENT_OTHER): Payer: Medicare Other | Admitting: Family

## 2022-06-26 VITALS — BP 134/85 | HR 70 | Resp 16 | Ht 66.0 in | Wt 112.4 lb

## 2022-06-26 DIAGNOSIS — Z85118 Personal history of other malignant neoplasm of bronchus and lung: Secondary | ICD-10-CM | POA: Insufficient documentation

## 2022-06-26 DIAGNOSIS — Z9981 Dependence on supplemental oxygen: Secondary | ICD-10-CM | POA: Insufficient documentation

## 2022-06-26 DIAGNOSIS — Z8501 Personal history of malignant neoplasm of esophagus: Secondary | ICD-10-CM | POA: Insufficient documentation

## 2022-06-26 DIAGNOSIS — I10 Essential (primary) hypertension: Secondary | ICD-10-CM | POA: Diagnosis not present

## 2022-06-26 DIAGNOSIS — I5022 Chronic systolic (congestive) heart failure: Secondary | ICD-10-CM | POA: Insufficient documentation

## 2022-06-26 DIAGNOSIS — Z7901 Long term (current) use of anticoagulants: Secondary | ICD-10-CM | POA: Insufficient documentation

## 2022-06-26 DIAGNOSIS — Z87891 Personal history of nicotine dependence: Secondary | ICD-10-CM | POA: Insufficient documentation

## 2022-06-26 DIAGNOSIS — Z923 Personal history of irradiation: Secondary | ICD-10-CM | POA: Insufficient documentation

## 2022-06-26 DIAGNOSIS — Z853 Personal history of malignant neoplasm of breast: Secondary | ICD-10-CM | POA: Insufficient documentation

## 2022-06-26 DIAGNOSIS — E1122 Type 2 diabetes mellitus with diabetic chronic kidney disease: Secondary | ICD-10-CM | POA: Insufficient documentation

## 2022-06-26 DIAGNOSIS — Z79899 Other long term (current) drug therapy: Secondary | ICD-10-CM | POA: Insufficient documentation

## 2022-06-26 DIAGNOSIS — Z9221 Personal history of antineoplastic chemotherapy: Secondary | ICD-10-CM | POA: Insufficient documentation

## 2022-06-26 DIAGNOSIS — I972 Postmastectomy lymphedema syndrome: Secondary | ICD-10-CM | POA: Insufficient documentation

## 2022-06-26 DIAGNOSIS — M109 Gout, unspecified: Secondary | ICD-10-CM | POA: Insufficient documentation

## 2022-06-26 DIAGNOSIS — Z803 Family history of malignant neoplasm of breast: Secondary | ICD-10-CM | POA: Insufficient documentation

## 2022-06-26 DIAGNOSIS — I4891 Unspecified atrial fibrillation: Secondary | ICD-10-CM | POA: Insufficient documentation

## 2022-06-26 DIAGNOSIS — I130 Hypertensive heart and chronic kidney disease with heart failure and stage 1 through stage 4 chronic kidney disease, or unspecified chronic kidney disease: Secondary | ICD-10-CM | POA: Insufficient documentation

## 2022-06-26 DIAGNOSIS — F32A Depression, unspecified: Secondary | ICD-10-CM | POA: Insufficient documentation

## 2022-06-26 DIAGNOSIS — J449 Chronic obstructive pulmonary disease, unspecified: Secondary | ICD-10-CM | POA: Insufficient documentation

## 2022-06-26 DIAGNOSIS — N184 Chronic kidney disease, stage 4 (severe): Secondary | ICD-10-CM | POA: Insufficient documentation

## 2022-06-26 DIAGNOSIS — Z8616 Personal history of COVID-19: Secondary | ICD-10-CM | POA: Insufficient documentation

## 2022-06-26 DIAGNOSIS — I480 Paroxysmal atrial fibrillation: Secondary | ICD-10-CM | POA: Diagnosis not present

## 2022-06-26 LAB — BASIC METABOLIC PANEL
Anion gap: 12 (ref 5–15)
BUN: 34 mg/dL — ABNORMAL HIGH (ref 8–23)
CO2: 28 mmol/L (ref 22–32)
Calcium: 9 mg/dL (ref 8.9–10.3)
Chloride: 104 mmol/L (ref 98–111)
Creatinine, Ser: 1.86 mg/dL — ABNORMAL HIGH (ref 0.44–1.00)
GFR, Estimated: 29 mL/min — ABNORMAL LOW (ref >60)
Glucose, Bld: 85 mg/dL (ref 70–99)
Potassium: 2.9 mmol/L — ABNORMAL LOW (ref 3.5–5.1)
Sodium: 144 mmol/L (ref 135–145)

## 2022-06-26 LAB — BRAIN NATRIURETIC PEPTIDE: B Natriuretic Peptide: 2605.7 pg/mL — ABNORMAL HIGH (ref 0.0–100.0)

## 2022-06-26 MED ORDER — POTASSIUM CHLORIDE CRYS ER 20 MEQ PO TBCR: 40.0000 meq | EXTENDED_RELEASE_TABLET | Freq: Every day | ORAL | 3 refills | Status: AC

## 2022-06-26 NOTE — Patient Instructions (Signed)
Continue weighing daily and call for an overnight weight gain of 3 pounds or more or a weekly weight gain of more than 5 pounds  If you have voicemail, please make sure your mailbox is cleaned out so that we may leave a message and please make sure to listen to any voicemails.    If you receive a satisfaction survey regarding the [facility], please take the time to fill it out. This way we can continue to provide excellent care and make any changes that need to be made.     

## 2022-06-26 NOTE — Telephone Encounter (Signed)
Spoke with patient regarding lab results obtained earlier today. Potassium is low at 2.9 and she doesn't take any potassium supplements. Renal function is slightly worse.   Will begin potassium supplements as 23meq daily. She was instructed that she could take both tablets at the same time or split them up. Will check labs at next visit if not done elsewhere before that.   She verbalized understanding.

## 2022-07-05 ENCOUNTER — Inpatient Hospital Stay: Payer: Medicare Other

## 2022-07-05 ENCOUNTER — Inpatient Hospital Stay (HOSPITAL_BASED_OUTPATIENT_CLINIC_OR_DEPARTMENT_OTHER): Payer: Medicare Other | Admitting: Medical Oncology

## 2022-07-05 ENCOUNTER — Inpatient Hospital Stay: Payer: Medicare Other | Attending: Internal Medicine

## 2022-07-05 ENCOUNTER — Encounter: Payer: Self-pay | Admitting: Medical Oncology

## 2022-07-05 VITALS — BP 129/78 | HR 79 | Temp 97.2°F

## 2022-07-05 DIAGNOSIS — C155 Malignant neoplasm of lower third of esophagus: Secondary | ICD-10-CM

## 2022-07-05 DIAGNOSIS — Z8616 Personal history of COVID-19: Secondary | ICD-10-CM | POA: Diagnosis not present

## 2022-07-05 DIAGNOSIS — I130 Hypertensive heart and chronic kidney disease with heart failure and stage 1 through stage 4 chronic kidney disease, or unspecified chronic kidney disease: Secondary | ICD-10-CM | POA: Insufficient documentation

## 2022-07-05 DIAGNOSIS — I4891 Unspecified atrial fibrillation: Secondary | ICD-10-CM | POA: Insufficient documentation

## 2022-07-05 DIAGNOSIS — Z803 Family history of malignant neoplasm of breast: Secondary | ICD-10-CM | POA: Insufficient documentation

## 2022-07-05 DIAGNOSIS — N184 Chronic kidney disease, stage 4 (severe): Secondary | ICD-10-CM

## 2022-07-05 DIAGNOSIS — Z853 Personal history of malignant neoplasm of breast: Secondary | ICD-10-CM | POA: Diagnosis not present

## 2022-07-05 DIAGNOSIS — D631 Anemia in chronic kidney disease: Secondary | ICD-10-CM

## 2022-07-05 DIAGNOSIS — Z87891 Personal history of nicotine dependence: Secondary | ICD-10-CM | POA: Diagnosis not present

## 2022-07-05 DIAGNOSIS — Z7901 Long term (current) use of anticoagulants: Secondary | ICD-10-CM | POA: Insufficient documentation

## 2022-07-05 DIAGNOSIS — I5032 Chronic diastolic (congestive) heart failure: Secondary | ICD-10-CM | POA: Insufficient documentation

## 2022-07-05 DIAGNOSIS — Z85118 Personal history of other malignant neoplasm of bronchus and lung: Secondary | ICD-10-CM | POA: Diagnosis not present

## 2022-07-05 DIAGNOSIS — R300 Dysuria: Secondary | ICD-10-CM | POA: Insufficient documentation

## 2022-07-05 LAB — BASIC METABOLIC PANEL
Anion gap: 9 (ref 5–15)
BUN: 23 mg/dL (ref 8–23)
CO2: 22 mmol/L (ref 22–32)
Calcium: 8.8 mg/dL — ABNORMAL LOW (ref 8.9–10.3)
Chloride: 104 mmol/L (ref 98–111)
Creatinine, Ser: 1.84 mg/dL — ABNORMAL HIGH (ref 0.44–1.00)
GFR, Estimated: 30 mL/min — ABNORMAL LOW (ref >60)
Glucose, Bld: 76 mg/dL (ref 70–99)
Potassium: 4.1 mmol/L (ref 3.5–5.1)
Sodium: 135 mmol/L (ref 135–145)

## 2022-07-05 LAB — CBC WITH DIFFERENTIAL/PLATELET
Abs Immature Granulocytes: 0.02 10*3/uL (ref 0.00–0.07)
Basophils Absolute: 0 10*3/uL (ref 0.0–0.1)
Basophils Relative: 1 %
Eosinophils Absolute: 0.2 10*3/uL (ref 0.0–0.5)
Eosinophils Relative: 3 %
HCT: 32.1 % — ABNORMAL LOW (ref 36.0–46.0)
Hemoglobin: 10.5 g/dL — ABNORMAL LOW (ref 12.0–15.0)
Immature Granulocytes: 0 %
Lymphocytes Relative: 17 %
Lymphs Abs: 1 10*3/uL (ref 0.7–4.0)
MCH: 31.1 pg (ref 26.0–34.0)
MCHC: 32.7 g/dL (ref 30.0–36.0)
MCV: 95 fL (ref 80.0–100.0)
Monocytes Absolute: 0.3 10*3/uL (ref 0.1–1.0)
Monocytes Relative: 6 %
Neutro Abs: 4 10*3/uL (ref 1.7–7.7)
Neutrophils Relative %: 73 %
Platelets: 245 10*3/uL (ref 150–400)
RBC: 3.38 MIL/uL — ABNORMAL LOW (ref 3.87–5.11)
RDW: 14.9 % (ref 11.5–15.5)
Smear Review: NORMAL
WBC: 5.5 10*3/uL (ref 4.0–10.5)
nRBC: 0 % (ref 0.0–0.2)

## 2022-07-05 LAB — URINALYSIS, COMPLETE (UACMP) WITH MICROSCOPIC
Bilirubin Urine: NEGATIVE
Glucose, UA: NEGATIVE mg/dL
Ketones, ur: NEGATIVE mg/dL
Leukocytes,Ua: NEGATIVE
Nitrite: NEGATIVE
Protein, ur: 100 mg/dL — AB
Specific Gravity, Urine: 1.015 (ref 1.005–1.030)
Squamous Epithelial / HPF: NONE SEEN (ref 0–5)
pH: 5 (ref 5.0–8.0)

## 2022-07-05 NOTE — Progress Notes (Signed)
[place] OFFICE PROGRESS NOTE  Patient Care Team: [name], [name] as PCP - General (Family Medicine) [name], FNP as Nurse Practitioner (Family Medicine) [name], NP as Nurse Practitioner (Nurse Practitioner) [name], RN as Registered Nurse [name], RN (Inactive) as Registered Nurse [name], RN as Oncology Nurse Navigator [name], MD as Consulting Physician (Hematology and Oncology) Jonathon Bellows, MD as Consulting Physician (Gastroenterology)   Cancer Staging  Breast cancer in female [facility]) Staging form: Breast, AJCC 7th Edition - Clinical: Stage IIIA (T3, N1, M0) - Signed by [name], NP on 02/21/2016 Estrogen receptor status: Positive Progesterone receptor status: Positive    Oncology History Overview Note  # 2016- JAN RUL 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 A. LUNG MASS, RIGHT; CT-GUIDED BIOPSY: - ADENOCARCINOMA, ACINAR AND PAPILLARY MORPHOLOGIES. S/p RT [finished Dec 2017] s/p RT [DEC 2017]; AUG 2019- PET-right middle lobe radiation changes no evidence of recurrence.  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  T1b- submucosal involvement; N0- stage I [[name]; [facility]]; unable to proceed with Endo mucosal resection-because of extensive disease [Dr.Branch; [facility]]; recommend definitive radiation [finished  RT- 09/21/2019]   #September 2020 worsening macrocytic anemia--bone marrow biopsy shows plasma cells up to 5%; unlikely to explain patient's anemia.  MDS is still a possibility-if anemia worsens would recommend peripheral blood NGS.  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] #squamous cell carcinoma esophagus -stage I   CURRENT/MOST RECENT THERAPY: Status post radiation       Breast cancer in female [facility])  07/24/1999 Initial Diagnosis   Breast cancer in female ([place]) T3 N1 M0 tumor ER/PR positive   08/24/1999 -  Anti-estrogen oral therapy   Started tamoxifen   08/24/1999 Surgery   Status post modified radical mastectomy of left breast    Chemotherapy      Radiation Therapy     11/24/2003 - 02/20/2009 Anti-estrogen oral therapy   Started Aromasin   Carcinoma of overlapping sites of left breast in female, estrogen receptor positive ([place])  06/22/2016 Initial Diagnosis   Cancer of overlapping sites of left female breast ([place])   Malignant neoplasm of upper lobe of right lung ([place])  Malignant neoplasm of lower third of esophagus ([place])  05/01/2019 Initial Diagnosis   Malignant neoplasm of lower third of esophagus ([place])     INTERVAL HISTORY: In a wheelchair.   Danielle [name] 66 y.o.  female pleasant patient above history of multiple malignancies including most recent stage I squamous cell cancer of esophagus currently status post radiation; and history of severe anemia-CKD stage IV/MGUS is here for follow-up.  She states that she is doing well other than some dysuria that she has had since Monday. No fever, nausea, abdominal pain, hematuria. She has not taken anything for symptoms. No recent UTIs. No vaginal discharge. Since her last visit she has had a  few hospitalizations for CHF (06/16/2022), Hypoxia (05/22/2022), pleural effusion with hypoxia (05/08/2022, A. Fib (04/30/2022, CHF  (04/13/2022, GI hemorrhage (03/09/2022, A. Fib/CHF (02/21/2022), A. Fib/pleural effusion (01/26/2022). Recently though she reports feeling well. She is attending medical follow up appointments. She is taking her medications as directed. She denies any chest pain, palpitations, SOB, new fatigue, bleeding episodes.   Denies any difficulty swallowing.  Denies any fevers or chills.  No nausea no vomiting.  No cough.  She denies any known unintentional weight loss or night sweats.    Review of Systems  Constitutional:  Positive for malaise/fatigue. Negative for chills, diaphoresis, fever and weight loss.  HENT:  Negative for nosebleeds and sore throat.   Eyes:  Negative for double vision.  Respiratory:  Negative for hemoptysis, sputum production, shortness of breath and wheezing.   Cardiovascular:  Negative for chest pain, palpitations, orthopnea and leg swelling.  Gastrointestinal:  Negative for abdominal pain, blood in stool, constipation, heartburn, melena and vomiting.  Genitourinary:  Positive for dysuria. Negative for frequency and urgency.  Musculoskeletal:  Negative for back pain and joint pain.  Skin: Negative.  Negative for itching and rash.  Neurological:  Negative for tingling, focal weakness, weakness and headaches.  Endo/Heme/Allergies:  Does not bruise/bleed easily.  Psychiatric/Behavioral:  Negative for depression. The patient is not nervous/anxious and does not have insomnia.       PAST MEDICAL HISTORY :  Past Medical History:  Diagnosis Date   Acute respiratory failure due to COVID-19 ([place]) 09/27/2019   Anemia    Anxiety    Aortic atherosclerosis ([place])    Aortic valve stenosis 02/10/2018   a.) TTE 02/10/2018: EF 55-60%: mild AS with MPG of 12 mmHg. b.) TTE 04/21/2018: EF 35-40%; mild AS with MPG 8 mmHg. c.) TTE 11/07/2019: EF 50-55%; mild AS with MPG 10 mmHg.   Arthritis    Atrial fibrillation (HCC)    a.) CHA2DS2-VASc = 4 (age, sex, HTN, aortic plaque). b.) Rate/rhythm  maintained on oral carvedilol; chronically anticoagulated on dose reduced apixaban. c.)  Attempted deployment of LAA occlusive device on 01/11/2021; parameters failed and procedure aborted.   Breast cancer, left ([place]) 2000   a.) T2N1M0; ER/PR (+) --> Tx'd with total mastectomy, LN resection, XRT, and chemotherapy   Cancer of right lung ([place]) 07/30/2016   a.) adenocarcinoma; ALK, ROS1, PDL1, BRAF, EGFR all negative.   Chronic diastolic CHF (congestive heart failure) ([place]) 11/26/2021   CKD (chronic kidney disease), stage IV (HCC)    COPD (chronic obstructive pulmonary disease) (HCC)    Dependence on supplemental oxygen    Depression    Diastolic dysfunction 77/41/4239   a.) TTE 02/10/2017: EF 55-60%; G2DD. b.) TTE 04/21/2018: EF 35-40%; mild LA dilation; mod MV regurgitation. c.) TTE 11/07/2019: EF 50-55%; G1DD.   DOE (dyspnea on exertion)    GERD (gastroesophageal reflux disease)    Heart murmur    History of 2019 novel coronavirus disease (COVID-19) 10/14/2019   HLD (hyperlipidemia)    Hypertension    Long term current use of anticoagulant    a.) apixaban   Lymphedema    Personal history of chemotherapy    Personal history of radiation therapy    Respiratory tract infection due to COVID-19 virus 09/27/2019   SBO (small bowel obstruction) ([place]) 11/05/2020   Vitamin D deficiency     PAST SURGICAL HISTORY :   Past Surgical History:  Procedure Laterality Date   Breast Biospy Left    [facility]   BREAST SURGERY  CARDIOVERSION N/A 02/23/2022   Procedure: CARDIOVERSION;  Surgeon: Corey Skains, MD;  Location: [facility] ORS;  Service: Cardiovascular;  Laterality: N/A;   COLONOSCOPY N/A 04/30/2018   Procedure: COLONOSCOPY;  Surgeon: Virgel Manifold, MD;  Location: [facility] ENDOSCOPY;  Service: Endoscopy;  Laterality: N/A;   COLONOSCOPY N/A 07/22/2018   Procedure: COLONOSCOPY;  Surgeon: Virgel Manifold, MD;  Location: [facility] ENDOSCOPY;  Service: Endoscopy;  Laterality: N/A;   COLONOSCOPY  WITH PROPOFOL N/A 09/21/2021   Procedure: COLONOSCOPY WITH PROPOFOL;  Surgeon: Benjamine Sprague, DO;  Location: [facility] ENDOSCOPY;  Service: General;  Laterality: N/A;   COLONOSCOPY WITH PROPOFOL N/A 03/11/2022   Procedure: COLONOSCOPY WITH PROPOFOL;  Surgeon: Annamaria Helling, DO;  Location: [facility] ENDOSCOPY;  Service: Gastroenterology;  Laterality: N/A;   DILATION AND CURETTAGE OF UTERUS     ELECTROMAGNETIC NAVIGATION BROCHOSCOPY Right 04/11/2016   Procedure: ELECTROMAGNETIC NAVIGATION BRONCHOSCOPY;  Surgeon: Vilinda Boehringer, MD;  Location: [facility] ORS;  Service: Cardiopulmonary;  Laterality: Right;   ESOPHAGOGASTRODUODENOSCOPY N/A 07/22/2018   Procedure: ESOPHAGOGASTRODUODENOSCOPY (EGD);  Surgeon: Virgel Manifold, MD;  Location: [facility] ENDOSCOPY;  Service: Endoscopy;  Laterality: N/A;   ESOPHAGOGASTRODUODENOSCOPY (EGD) WITH PROPOFOL N/A 05/07/2018   Procedure: ESOPHAGOGASTRODUODENOSCOPY (EGD) WITH PROPOFOL;  Surgeon: Lucilla Lame, MD;  Location: [facility] ENDOSCOPY;  Service: Endoscopy;  Laterality: N/A;   ESOPHAGOGASTRODUODENOSCOPY (EGD) WITH PROPOFOL N/A 04/24/2019   Procedure: ESOPHAGOGASTRODUODENOSCOPY (EGD) WITH PROPOFOL;  Surgeon: Jonathon Bellows, MD;  Location: [facility] ENDOSCOPY;  Service: Gastroenterology;  Laterality: N/A;   ESOPHAGOGASTRODUODENOSCOPY (EGD) WITH PROPOFOL N/A 01/12/2020   Procedure: ESOPHAGOGASTRODUODENOSCOPY (EGD) WITH PROPOFOL;  Surgeon: Jonathon Bellows, MD;  Location: [facility] ENDOSCOPY;  Service: Gastroenterology;  Laterality: N/A;   ESOPHAGOGASTRODUODENOSCOPY (EGD) WITH PROPOFOL N/A 04/28/2020   Procedure: ESOPHAGOGASTRODUODENOSCOPY (EGD) WITH PROPOFOL;  Surgeon: Jonathon Bellows, MD;  Location[facility] ENDOSCOPY;  Service: Gastroenterology;  Laterality: N/A;   ESOPHAGOGASTRODUODENOSCOPY (EGD) WITH PROPOFOL N/A 03/11/2022   Procedure: ESOPHAGOGASTRODUODENOSCOPY (EGD) WITH PROPOFOL;  Surgeon: Annamaria Helling, DO;  Location: [facility] ENDOSCOPY;  Service: Gastroenterology;  Laterality: N/A;   EUS N/A  05/07/2019   Procedure: FULL UPPER ENDOSCOPIC ULTRASOUND (EUS) RADIAL;  Surgeon: Jola Schmidt, MD;  Location: [facility] ENDOSCOPY;  Service: Endoscopy;  Laterality: N/A;   ILEOSCOPY N/A 07/22/2018   Procedure: ILEOSCOPY THROUGH STOMA;  Surgeon: Virgel Manifold, MD;  Location: [facility] ENDOSCOPY;  Service: Endoscopy;  Laterality: N/A;   ILEOSTOMY     ILEOSTOMY N/A 09/08/2018   Procedure: ILEOSTOMY REVISION POSSIBLE CREATION;  Surgeon: Herbert Pun, MD;  Location: [facility] ORS;  Service: General;  Laterality: N/A;   ILEOSTOMY CLOSURE N/A 08/15/2018   Procedure: DILATION OF ILEOSTOMY STRICTURE;  Surgeon: Herbert Pun, MD;  Location: [facility] ORS;  Service: General;  Laterality: N/A;   LAPAROTOMY Right 05/04/2018   Procedure: EXPLORATORY LAPAROTOMY right colectomy right and left ostomy;  Surgeon: Herbert Pun, MD;  Location: [facility] ORS;  Service: General;  Laterality: Right;   LEFT ATRIAL APPENDAGE OCCLUSION N/A 01/11/2021   Procedure: LEFT ATRIAL APPENDAGE OCCLUSION ([place]); ABORTED PROCEDURE WITHOUT DEVICE BEING IMPLANTED; Location: [facility]; Surgeon: Mylinda Latina, MD   LUNG BIOPSY     MASTECTOMY Left    2000, [place]   ROTATOR CUFF REPAIR Right    [place] TAKEDOWN N/A 10/23/2021   Procedure: XI ROBOTIC ASSISTED ILEOSTOMY TAKEDOWN;  Surgeon: Herbert Pun, MD;  Location: [facility] ORS;  Service: General;  Laterality: N/A;  180 minutes for the surgery part please    FAMILY HISTORY :   Family History  Problem Relation  Age of Onset   Breast cancer Mother 23   Cancer Mother        Breast    Cirrhosis Father    Breast cancer Paternal Aunt 54   Cancer Maternal Aunt        Breast     SOCIAL HISTORY:   Social History   Tobacco Use   Smoking status: Former    Packs/day: 0.50    Years: 20.00    Total pack years: 10.00    Types: Cigarettes    Quit date: 07/02/2012    Years since quitting: 10.0   Smokeless tobacco: Current    Types: Snuff   Vaping Use   Vaping Use: Never used  Substance Use Topics   Alcohol use: Yes    Alcohol/week: 3.0 standard drinks of alcohol    Types: 3 Cans of beer per week    Comment: Occasionally beer   Drug use: No    ALLERGIES:  has No Known Allergies.  MEDICATIONS:  Current Outpatient Medications  Medication Sig Dispense Refill   amiodarone (PACERONE) 200 MG tablet Take 1 tablet (200 mg total) by mouth daily. 60 tablet 0   apixaban (ELIQUIS) 2.5 MG TABS tablet Take 1 tablet (2.5 mg total) by mouth 2 (two) times daily. 60 tablet 0   atorvastatin (LIPITOR) 40 MG tablet Take 1 tablet (40 mg total) by mouth daily. 30 tablet 2   calcium-vitamin D (OSCAL WITH D) 500-5 MG-MCG tablet Take 1 tablet by mouth daily.     citalopram (CELEXA) 20 MG tablet Take 1 tablet (20 mg total) by mouth daily. (Patient taking differently: Take 40 mg by mouth daily.) 30 tablet 2   ferrous sulfate 325 (65 FE) MG tablet Take 325 mg by mouth 2 (two) times daily with a meal.     hydrALAZINE (APRESOLINE) 10 MG tablet Take 1 tablet (10 mg total) by mouth 3 (three) times daily. 90 tablet 0   isosorbide dinitrate (ISORDIL) 5 MG tablet Take 1 tablet (5 mg total) by mouth 3 (three) times daily. 90 tablet 0   loperamide (IMODIUM) 2 MG capsule Take 2 mg by mouth daily as needed for diarrhea or loose stools.     metoprolol tartrate (LOPRESSOR) 25 MG tablet Take 1 tablet (25 mg total) by mouth 2 (two) times daily. 60 tablet 2   Multiple Vitamin (MULTIVITAMIN WITH MINERALS) TABS tablet Take 1 tablet by mouth daily.     OXYGEN Inhale 4 L into the lungs daily.     pantoprazole (PROTONIX) 40 MG tablet Take 1 tablet (40 mg total) by mouth 2 (two) times daily before a meal. 60 tablet 2   potassium chloride (KLOR-CON) 8 MEQ tablet Take by mouth.     predniSONE (DELTASONE) 5 MG tablet Take by mouth.     simethicone (MYLICON) 80 MG chewable tablet Chew 1 tablet (80 mg total) by mouth every 6 (six) hours as needed for flatulence. 30 tablet 0    sodium bicarbonate 650 MG tablet Take 1,300 mg by mouth daily.     sucralfate (CARAFATE) 1 GM/10ML suspension Take 0.5 g by mouth 2 (two) times daily.     Torsemide 40 MG TABS Take 20 mg by mouth daily. Hold this medication until you see your cardiologist     acetaminophen (TYLENOL) 325 MG tablet Take 2 tablets (650 mg total) by mouth every 6 (six) hours as needed for mild pain (or Fever >/= 101). (Patient taking differently: Take 650 mg by mouth every 4 (  four) hours as needed for moderate pain.)     albuterol (PROVENTIL HFA;VENTOLIN HFA) 108 (90 Base) MCG/ACT inhaler Inhale 2 puffs into the lungs every 6 (six) hours as needed for wheezing or shortness of breath. 1 Inhaler 2   calcium carbonate (TUMS - DOSED IN MG ELEMENTAL CALCIUM) 500 MG chewable tablet Chew 0.5 tablets by mouth daily as needed. (Patient not taking: Reported on 07/05/2022)     Fluticasone-Umeclidin-Vilant (TRELEGY ELLIPTA) 100-62.5-25 MCG/ACT AEPB Inhale 1 puff into the lungs daily.     potassium chloride SA (KLOR-CON M) 20 MEQ tablet Take 2 tablets (40 mEq total) by mouth daily. (Patient not taking: Reported on 07/05/2022) 60 tablet 3   zolpidem (AMBIEN) 5 MG tablet Take 5 mg by mouth at bedtime as needed for sleep. (Patient not taking: Reported on 07/05/2022)     No current facility-administered medications for this visit.    PHYSICAL EXAMINATION: ECOG PERFORMANCE STATUS: 1 - Symptomatic but completely ambulatory  BP 129/78   Pulse 79   Temp (!) 97.2 F (36.2 C)   SpO2 97%   Filed Weights    Physical Exam Constitutional:      Comments: Thin built moderately nourished female patient.  In wheelchair. Wearing portable O2  HENT:     Head: Normocephalic and atraumatic.     Mouth/Throat:     Pharynx: No oropharyngeal exudate.  Eyes:     Pupils: Pupils are equal, round, and reactive to light.  Cardiovascular:     Rate and Rhythm: Normal rate and regular rhythm.  Pulmonary:     Effort: No respiratory distress.      Breath sounds: No wheezing.     Comments: Decreased breath sounds bilaterally.  Abdominal:     General: Bowel sounds are normal. There is no distension.     Palpations: Abdomen is soft. There is no mass.     Tenderness: There is no abdominal tenderness. There is no guarding or rebound.  Musculoskeletal:        General: No tenderness. Normal range of motion.     Cervical back: Normal range of motion and neck supple.  Skin:    General: Skin is warm.  Neurological:     Mental Status: She is alert and oriented to person, place, and time.  Psychiatric:        Mood and Affect: Affect normal.     LABORATORY DATA:  I have reviewed the data as listed    Component Value Date/Time   NA 135 07/05/2022 1044   NA 132 (L) 02/09/2015 1100   K 4.1 07/05/2022 1044   K 3.8 02/09/2015 1100   CL 104 07/05/2022 1044   CL 95 (L) 02/09/2015 1100   CO2 22 07/05/2022 1044   CO2 29 02/09/2015 1100   GLUCOSE 76 07/05/2022 1044   GLUCOSE 105 (H) 02/09/2015 1100   BUN 23 07/05/2022 1044   BUN 16 02/09/2015 1100   CREATININE 1.84 (H) 07/05/2022 1044   CREATININE 0.81 02/09/2015 1100   CALCIUM 8.8 (L) 07/05/2022 1044   CALCIUM 9.1 02/09/2015 1100   PROT 7.4 06/17/2022 1044   PROT 7.7 02/09/2015 1100   ALBUMIN 3.4 (L) 06/17/2022 1044   ALBUMIN 4.3 02/09/2015 1100   AST 23 06/17/2022 1044   AST 29 02/09/2015 1100   ALT 17 06/17/2022 1044   ALT 20 02/09/2015 1100   ALKPHOS 52 06/17/2022 1044   ALKPHOS 69 02/09/2015 1100   BILITOT 1.1 06/17/2022 1044   BILITOT 0.9 02/09/2015 1100  GFRNONAA 30 (L) 07/05/2022 1044   GFRNONAA >60 02/09/2015 1100   GFRAA 37 (L) 06/29/2020 0930   GFRAA >60 02/09/2015 1100    No results found for: "SPEP", "UPEP"  Lab Results  Component Value Date   WBC 5.5 07/05/2022   NEUTROABS 4.0 07/05/2022   HGB 10.5 (L) 07/05/2022   HCT 32.1 (L) 07/05/2022   MCV 95.0 07/05/2022   PLT 245 07/05/2022      Chemistry      Component Value Date/Time   NA 135 07/05/2022  1044   NA 132 (L) 02/09/2015 1100   K 4.1 07/05/2022 1044   K 3.8 02/09/2015 1100   CL 104 07/05/2022 1044   CL 95 (L) 02/09/2015 1100   CO2 22 07/05/2022 1044   CO2 29 02/09/2015 1100   BUN 23 07/05/2022 1044   BUN 16 02/09/2015 1100   CREATININE 1.84 (H) 07/05/2022 1044   CREATININE 0.81 02/09/2015 1100      Component Value Date/Time   CALCIUM 8.8 (L) 07/05/2022 1044   CALCIUM 9.1 02/09/2015 1100   ALKPHOS 52 06/17/2022 1044   ALKPHOS 69 02/09/2015 1100   AST 23 06/17/2022 1044   AST 29 02/09/2015 1100   ALT 17 06/17/2022 1044   ALT 20 02/09/2015 1100   BILITOT 1.1 06/17/2022 1044   BILITOT 0.9 02/09/2015 1100       RADIOGRAPHIC STUDIES: I have personally reviewed the radiological images as listed and agreed with the findings in the report. No results found.   ASSESSMENT & PLAN:  No problem-specific Assessment & Plan notes found for this encounter.  Encounter Diagnoses  Name Primary?   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 Stage I.  Currently s/p definitive radiation [dec 7th 2020].  Posttreatment PET scan February 2021-no evidence of any metastatic disease.  No uptake in the esophagus region. EGD April 28 2020 th- NO evidence of recurrence.Continue surveillance. Discussed with [name] re: repeating EGD. STABLE. NO change in plan today. Continue follow up with GI, PCP.    #Severe anemia-likely secondary to CKD stage IV-currently on Retacrit; Chronic in nature. Today her hemoglobin is 10.5-  HOLD  Retacrit today. RTC 4 months with labs.    #CKD stage III- IV-Chronic and stable. Reviewed recent labs which included a creatinine of 1.84 today. Continue management by Dr. Holley Raring   # A.fib on elquis- Chronic. Currently well controlled. Continue follow up with Dr.  [name] in cardiology.    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 Anemia stable today. No worsened kidney function and no bone pains. RTC 4 months with labs.   # Dysuria: New. UA not suggestive of UTI but will culture to ensure no ABX needed at this time. If symptoms fail to improve and urine culture is negative I would recommend urology input.    # DISPOSITION:  #  HOLD RETACRIT today # in 2 months- H&H- possible retacrit # follow up in 4 months-- MD, cbc/bmp/light chains, MM panel, possible Retacrit    Orders Placed This Encounter  Procedures   Urine Culture    Standing Status:   Future    Number of Occurrences:  1    Standing Expiration Date:   07/06/2023   Urinalysis, Complete w Microscopic    Standing Status:   Standing    Number of Occurrences:   1   CBC with Differential/Platelet    Standing Status:   Future    Standing Expiration Date:   07/06/2023   Multiple Myeloma Panel (SPEP&IFE w/QIG)    Standing Status:   Future    Standing Expiration Date:   07/06/2023   Lactate dehydrogenase    Standing Status:   Future    Standing Expiration Date:   4/86/1612   Basic metabolic panel    Standing Status:   Future    Standing Expiration Date:   07/06/2023   All questions were answered. The patient knows to call the clinic with any problems, questions or concerns.      Hughie Closs, PA-C 07/05/2022 2:18 PM

## 2022-07-06 LAB — URINE CULTURE: Culture: NO GROWTH

## 2022-07-18 ENCOUNTER — Inpatient Hospital Stay
Admission: EM | Admit: 2022-07-18 | Discharge: 2022-09-14 | DRG: 291 | Disposition: E | Payer: Medicare Other | Attending: Internal Medicine | Admitting: Internal Medicine

## 2022-07-18 ENCOUNTER — Other Ambulatory Visit: Payer: Self-pay

## 2022-07-18 ENCOUNTER — Emergency Department: Payer: Medicare Other

## 2022-07-18 DIAGNOSIS — K5900 Constipation, unspecified: Secondary | ICD-10-CM | POA: Diagnosis not present

## 2022-07-18 DIAGNOSIS — K224 Dyskinesia of esophagus: Secondary | ICD-10-CM | POA: Diagnosis present

## 2022-07-18 DIAGNOSIS — I509 Heart failure, unspecified: Secondary | ICD-10-CM | POA: Diagnosis not present

## 2022-07-18 DIAGNOSIS — E1122 Type 2 diabetes mellitus with diabetic chronic kidney disease: Secondary | ICD-10-CM | POA: Diagnosis present

## 2022-07-18 DIAGNOSIS — I071 Rheumatic tricuspid insufficiency: Secondary | ICD-10-CM

## 2022-07-18 DIAGNOSIS — Z66 Do not resuscitate: Secondary | ICD-10-CM | POA: Diagnosis present

## 2022-07-18 DIAGNOSIS — J9621 Acute and chronic respiratory failure with hypoxia: Secondary | ICD-10-CM | POA: Diagnosis present

## 2022-07-18 DIAGNOSIS — I480 Paroxysmal atrial fibrillation: Secondary | ICD-10-CM | POA: Diagnosis not present

## 2022-07-18 DIAGNOSIS — E860 Dehydration: Secondary | ICD-10-CM | POA: Diagnosis present

## 2022-07-18 DIAGNOSIS — Z9221 Personal history of antineoplastic chemotherapy: Secondary | ICD-10-CM

## 2022-07-18 DIAGNOSIS — Z853 Personal history of malignant neoplasm of breast: Secondary | ICD-10-CM

## 2022-07-18 DIAGNOSIS — E872 Acidosis, unspecified: Secondary | ICD-10-CM | POA: Diagnosis not present

## 2022-07-18 DIAGNOSIS — I272 Pulmonary hypertension, unspecified: Secondary | ICD-10-CM | POA: Diagnosis present

## 2022-07-18 DIAGNOSIS — N179 Acute kidney failure, unspecified: Secondary | ICD-10-CM | POA: Diagnosis present

## 2022-07-18 DIAGNOSIS — I482 Chronic atrial fibrillation, unspecified: Secondary | ICD-10-CM | POA: Diagnosis present

## 2022-07-18 DIAGNOSIS — J439 Emphysema, unspecified: Secondary | ICD-10-CM | POA: Diagnosis present

## 2022-07-18 DIAGNOSIS — I1310 Hypertensive heart and chronic kidney disease without heart failure, with stage 1 through stage 4 chronic kidney disease, or unspecified chronic kidney disease: Secondary | ICD-10-CM | POA: Diagnosis not present

## 2022-07-18 DIAGNOSIS — Z7901 Long term (current) use of anticoagulants: Secondary | ICD-10-CM

## 2022-07-18 DIAGNOSIS — Z9981 Dependence on supplemental oxygen: Secondary | ICD-10-CM

## 2022-07-18 DIAGNOSIS — Z8616 Personal history of COVID-19: Secondary | ICD-10-CM

## 2022-07-18 DIAGNOSIS — R109 Unspecified abdominal pain: Secondary | ICD-10-CM | POA: Diagnosis not present

## 2022-07-18 DIAGNOSIS — D638 Anemia in other chronic diseases classified elsewhere: Secondary | ICD-10-CM | POA: Diagnosis present

## 2022-07-18 DIAGNOSIS — N184 Chronic kidney disease, stage 4 (severe): Secondary | ICD-10-CM | POA: Diagnosis present

## 2022-07-18 DIAGNOSIS — N189 Chronic kidney disease, unspecified: Secondary | ICD-10-CM | POA: Diagnosis not present

## 2022-07-18 DIAGNOSIS — I5043 Acute on chronic combined systolic (congestive) and diastolic (congestive) heart failure: Secondary | ICD-10-CM | POA: Diagnosis present

## 2022-07-18 DIAGNOSIS — Z20822 Contact with and (suspected) exposure to covid-19: Secondary | ICD-10-CM | POA: Diagnosis present

## 2022-07-18 DIAGNOSIS — I10 Essential (primary) hypertension: Secondary | ICD-10-CM | POA: Diagnosis present

## 2022-07-18 DIAGNOSIS — I130 Hypertensive heart and chronic kidney disease with heart failure and stage 1 through stage 4 chronic kidney disease, or unspecified chronic kidney disease: Principal | ICD-10-CM | POA: Diagnosis present

## 2022-07-18 DIAGNOSIS — I429 Cardiomyopathy, unspecified: Secondary | ICD-10-CM | POA: Diagnosis present

## 2022-07-18 DIAGNOSIS — Z515 Encounter for palliative care: Secondary | ICD-10-CM

## 2022-07-18 DIAGNOSIS — I959 Hypotension, unspecified: Secondary | ICD-10-CM | POA: Diagnosis not present

## 2022-07-18 DIAGNOSIS — C3491 Malignant neoplasm of unspecified part of right bronchus or lung: Secondary | ICD-10-CM | POA: Diagnosis present

## 2022-07-18 DIAGNOSIS — E43 Unspecified severe protein-calorie malnutrition: Secondary | ICD-10-CM | POA: Diagnosis present

## 2022-07-18 DIAGNOSIS — Z8501 Personal history of malignant neoplasm of esophagus: Secondary | ICD-10-CM

## 2022-07-18 DIAGNOSIS — I350 Nonrheumatic aortic (valve) stenosis: Secondary | ICD-10-CM | POA: Diagnosis present

## 2022-07-18 DIAGNOSIS — D631 Anemia in chronic kidney disease: Secondary | ICD-10-CM | POA: Diagnosis present

## 2022-07-18 DIAGNOSIS — T502X5A Adverse effect of carbonic-anhydrase inhibitors, benzothiadiazides and other diuretics, initial encounter: Secondary | ICD-10-CM | POA: Diagnosis present

## 2022-07-18 DIAGNOSIS — R570 Cardiogenic shock: Secondary | ICD-10-CM | POA: Diagnosis present

## 2022-07-18 DIAGNOSIS — J918 Pleural effusion in other conditions classified elsewhere: Secondary | ICD-10-CM | POA: Diagnosis present

## 2022-07-18 DIAGNOSIS — Z923 Personal history of irradiation: Secondary | ICD-10-CM

## 2022-07-18 DIAGNOSIS — R079 Chest pain, unspecified: Secondary | ICD-10-CM

## 2022-07-18 DIAGNOSIS — J449 Chronic obstructive pulmonary disease, unspecified: Secondary | ICD-10-CM | POA: Diagnosis present

## 2022-07-18 DIAGNOSIS — R627 Adult failure to thrive: Secondary | ICD-10-CM | POA: Diagnosis not present

## 2022-07-18 DIAGNOSIS — I4892 Unspecified atrial flutter: Secondary | ICD-10-CM | POA: Diagnosis not present

## 2022-07-18 DIAGNOSIS — F32A Depression, unspecified: Secondary | ICD-10-CM | POA: Diagnosis present

## 2022-07-18 DIAGNOSIS — F17290 Nicotine dependence, other tobacco product, uncomplicated: Secondary | ICD-10-CM | POA: Diagnosis present

## 2022-07-18 DIAGNOSIS — E559 Vitamin D deficiency, unspecified: Secondary | ICD-10-CM | POA: Diagnosis present

## 2022-07-18 DIAGNOSIS — K219 Gastro-esophageal reflux disease without esophagitis: Secondary | ICD-10-CM | POA: Diagnosis present

## 2022-07-18 DIAGNOSIS — E785 Hyperlipidemia, unspecified: Secondary | ICD-10-CM | POA: Diagnosis not present

## 2022-07-18 DIAGNOSIS — J90 Pleural effusion, not elsewhere classified: Secondary | ICD-10-CM

## 2022-07-18 DIAGNOSIS — E871 Hypo-osmolality and hyponatremia: Secondary | ICD-10-CM | POA: Diagnosis present

## 2022-07-18 DIAGNOSIS — Z85118 Personal history of other malignant neoplasm of bronchus and lung: Secondary | ICD-10-CM

## 2022-07-18 DIAGNOSIS — I361 Nonrheumatic tricuspid (valve) insufficiency: Secondary | ICD-10-CM | POA: Diagnosis present

## 2022-07-18 DIAGNOSIS — Z681 Body mass index (BMI) 19 or less, adult: Secondary | ICD-10-CM

## 2022-07-18 DIAGNOSIS — Z79899 Other long term (current) drug therapy: Secondary | ICD-10-CM

## 2022-07-18 DIAGNOSIS — R1310 Dysphagia, unspecified: Secondary | ICD-10-CM

## 2022-07-18 DIAGNOSIS — R9431 Abnormal electrocardiogram [ECG] [EKG]: Secondary | ICD-10-CM | POA: Diagnosis present

## 2022-07-18 DIAGNOSIS — Z803 Family history of malignant neoplasm of breast: Secondary | ICD-10-CM

## 2022-07-18 DIAGNOSIS — Z9889 Other specified postprocedural states: Secondary | ICD-10-CM

## 2022-07-18 DIAGNOSIS — R0602 Shortness of breath: Secondary | ICD-10-CM | POA: Diagnosis present

## 2022-07-18 DIAGNOSIS — Z7189 Other specified counseling: Secondary | ICD-10-CM | POA: Diagnosis not present

## 2022-07-18 DIAGNOSIS — I5084 End stage heart failure: Secondary | ICD-10-CM | POA: Diagnosis present

## 2022-07-18 DIAGNOSIS — Z9012 Acquired absence of left breast and nipple: Secondary | ICD-10-CM

## 2022-07-18 DIAGNOSIS — C50812 Malignant neoplasm of overlapping sites of left female breast: Secondary | ICD-10-CM

## 2022-07-18 DIAGNOSIS — Z170 Estrogen receptor positive status [ER+]: Secondary | ICD-10-CM

## 2022-07-18 LAB — CBC WITH DIFFERENTIAL/PLATELET
Abs Immature Granulocytes: 0.02 10*3/uL (ref 0.00–0.07)
Basophils Absolute: 0 10*3/uL (ref 0.0–0.1)
Basophils Relative: 1 %
Eosinophils Absolute: 0 10*3/uL (ref 0.0–0.5)
Eosinophils Relative: 0 %
HCT: 29.9 % — ABNORMAL LOW (ref 36.0–46.0)
Hemoglobin: 9.7 g/dL — ABNORMAL LOW (ref 12.0–15.0)
Immature Granulocytes: 1 %
Lymphocytes Relative: 15 %
Lymphs Abs: 0.6 10*3/uL — ABNORMAL LOW (ref 0.7–4.0)
MCH: 31.4 pg (ref 26.0–34.0)
MCHC: 32.4 g/dL (ref 30.0–36.0)
MCV: 96.8 fL (ref 80.0–100.0)
Monocytes Absolute: 0.2 10*3/uL (ref 0.1–1.0)
Monocytes Relative: 4 %
Neutro Abs: 3.1 10*3/uL (ref 1.7–7.7)
Neutrophils Relative %: 79 %
Platelets: 189 10*3/uL (ref 150–400)
RBC: 3.09 MIL/uL — ABNORMAL LOW (ref 3.87–5.11)
RDW: 16 % — ABNORMAL HIGH (ref 11.5–15.5)
WBC: 3.9 10*3/uL — ABNORMAL LOW (ref 4.0–10.5)
nRBC: 1.8 % — ABNORMAL HIGH (ref 0.0–0.2)

## 2022-07-18 LAB — RESP PANEL BY RT-PCR (FLU A&B, COVID) ARPGX2
Influenza A by PCR: NEGATIVE
Influenza B by PCR: NEGATIVE
SARS Coronavirus 2 by RT PCR: NEGATIVE

## 2022-07-18 LAB — COMPREHENSIVE METABOLIC PANEL
ALT: 18 U/L (ref 0–44)
AST: 24 U/L (ref 15–41)
Albumin: 3.5 g/dL (ref 3.5–5.0)
Alkaline Phosphatase: 55 U/L (ref 38–126)
Anion gap: 8 (ref 5–15)
BUN: 45 mg/dL — ABNORMAL HIGH (ref 8–23)
CO2: 25 mmol/L (ref 22–32)
Calcium: 8.8 mg/dL — ABNORMAL LOW (ref 8.9–10.3)
Chloride: 105 mmol/L (ref 98–111)
Creatinine, Ser: 2.38 mg/dL — ABNORMAL HIGH (ref 0.44–1.00)
GFR, Estimated: 22 mL/min — ABNORMAL LOW (ref >60)
Glucose, Bld: 177 mg/dL — ABNORMAL HIGH (ref 70–99)
Potassium: 4.5 mmol/L (ref 3.5–5.1)
Sodium: 138 mmol/L (ref 135–145)
Total Bilirubin: 0.8 mg/dL (ref 0.3–1.2)
Total Protein: 6.5 g/dL (ref 6.5–8.1)

## 2022-07-18 LAB — BRAIN NATRIURETIC PEPTIDE: B Natriuretic Peptide: 2935 pg/mL — ABNORMAL HIGH (ref 0.0–100.0)

## 2022-07-18 LAB — TROPONIN I (HIGH SENSITIVITY)
Troponin I (High Sensitivity): 15 ng/L (ref <18)
Troponin I (High Sensitivity): 17 ng/L (ref <18)

## 2022-07-18 MED ORDER — FUROSEMIDE 10 MG/ML IJ SOLN
40.0000 mg | Freq: Once | INTRAMUSCULAR | Status: AC
  Administered 2022-07-18: 40 mg via INTRAVENOUS
  Filled 2022-07-18: qty 4

## 2022-07-18 MED ORDER — APIXABAN 2.5 MG PO TABS: 2.5000 mg | ORAL_TABLET | Freq: Two times a day (BID) | ORAL | Status: DC

## 2022-07-18 MED ORDER — SIMETHICONE 80 MG PO CHEW: 80.0000 mg | CHEWABLE_TABLET | Freq: Four times a day (QID) | ORAL | Status: DC | PRN

## 2022-07-18 MED ORDER — SODIUM BICARBONATE 650 MG PO TABS
1300.0000 mg | ORAL_TABLET | Freq: Every day | ORAL | Status: DC
  Administered 2022-07-19 – 2022-08-04 (×17): 1300 mg via ORAL
  Filled 2022-07-18 (×17): qty 2

## 2022-07-18 MED ORDER — ALBUTEROL SULFATE (2.5 MG/3ML) 0.083% IN NEBU
2.5000 mg | INHALATION_SOLUTION | Freq: Four times a day (QID) | RESPIRATORY_TRACT | Status: DC | PRN
  Administered 2022-07-21 – 2022-07-26 (×6): 2.5 mg via RESPIRATORY_TRACT
  Filled 2022-07-18 (×8): qty 3

## 2022-07-18 MED ORDER — ACETAMINOPHEN 650 MG RE SUPP: 650.0000 mg | Freq: Four times a day (QID) | RECTAL | Status: DC | PRN

## 2022-07-18 MED ORDER — AMIODARONE HCL 200 MG PO TABS
200.0000 mg | ORAL_TABLET | Freq: Every day | ORAL | Status: DC
  Administered 2022-07-19 – 2022-08-24 (×37): 200 mg via ORAL
  Filled 2022-07-18 (×37): qty 1

## 2022-07-18 MED ORDER — ONDANSETRON HCL 4 MG/2ML IJ SOLN: 4.0000 mg | Freq: Four times a day (QID) | INTRAMUSCULAR | Status: DC | PRN

## 2022-07-18 MED ORDER — METOPROLOL TARTRATE 50 MG PO TABS
50.0000 mg | ORAL_TABLET | Freq: Two times a day (BID) | ORAL | Status: DC
  Administered 2022-07-18 – 2022-07-19 (×2): 50 mg via ORAL
  Filled 2022-07-18 (×4): qty 1

## 2022-07-18 MED ORDER — ONDANSETRON HCL 4 MG PO TABS: 4.0000 mg | ORAL_TABLET | Freq: Four times a day (QID) | ORAL | Status: DC | PRN

## 2022-07-18 MED ORDER — FLUTICASONE FUROATE-VILANTEROL 100-25 MCG/ACT IN AEPB: 1.0000 | INHALATION_SPRAY | Freq: Every day | RESPIRATORY_TRACT | Status: DC

## 2022-07-18 MED ORDER — HYDRALAZINE HCL 10 MG PO TABS
10.0000 mg | ORAL_TABLET | Freq: Three times a day (TID) | ORAL | Status: DC
  Administered 2022-07-18 – 2022-07-19 (×2): 10 mg via ORAL
  Filled 2022-07-18 (×6): qty 1

## 2022-07-18 MED ORDER — FUROSEMIDE 10 MG/ML IJ SOLN
40.0000 mg | Freq: Two times a day (BID) | INTRAMUSCULAR | Status: DC
  Administered 2022-07-19: 40 mg via INTRAVENOUS
  Filled 2022-07-18: qty 4

## 2022-07-18 MED ORDER — HYDROCODONE-ACETAMINOPHEN 5-325 MG PO TABS
1.0000 | ORAL_TABLET | ORAL | Status: DC | PRN
  Administered 2022-07-19 – 2022-07-25 (×5): 2 via ORAL
  Administered 2022-07-27: 1 via ORAL
  Administered 2022-07-27: 2 via ORAL
  Administered 2022-07-28 – 2022-07-29 (×3): 1 via ORAL
  Administered 2022-07-30: 2 via ORAL
  Administered 2022-07-30: 1 via ORAL
  Administered 2022-07-30 – 2022-07-31 (×3): 2 via ORAL
  Administered 2022-08-02: 1 via ORAL
  Administered 2022-08-03 – 2022-08-05 (×4): 2 via ORAL
  Administered 2022-08-06: 1 via ORAL
  Administered 2022-08-07 – 2022-08-13 (×6): 2 via ORAL
  Administered 2022-08-13: 1 via ORAL
  Administered 2022-08-14 – 2022-08-20 (×11): 2 via ORAL
  Administered 2022-08-20: 1 via ORAL
  Administered 2022-08-20 – 2022-08-23 (×6): 2 via ORAL
  Filled 2022-07-18 (×5): qty 2
  Filled 2022-07-18: qty 1
  Filled 2022-07-18 (×3): qty 2
  Filled 2022-07-18 (×2): qty 1
  Filled 2022-07-18 (×6): qty 2
  Filled 2022-07-18 (×2): qty 1
  Filled 2022-07-18 (×8): qty 2
  Filled 2022-07-18: qty 1
  Filled 2022-07-18 (×10): qty 2
  Filled 2022-07-18: qty 1
  Filled 2022-07-18 (×3): qty 2
  Filled 2022-07-18: qty 1
  Filled 2022-07-18 (×3): qty 2

## 2022-07-18 MED ORDER — POTASSIUM CHLORIDE ER 8 MEQ PO TBCR: 8.0000 meq | EXTENDED_RELEASE_TABLET | Freq: Every day | ORAL | Status: DC

## 2022-07-18 MED ORDER — FLUTICASONE FUROATE-VILANTEROL 100-25 MCG/ACT IN AEPB
1.0000 | INHALATION_SPRAY | Freq: Every day | RESPIRATORY_TRACT | Status: DC
  Administered 2022-07-20 – 2022-07-26 (×7): 1 via RESPIRATORY_TRACT
  Filled 2022-07-18: qty 28

## 2022-07-18 MED ORDER — UMECLIDINIUM BROMIDE 62.5 MCG/ACT IN AEPB: 1.0000 | INHALATION_SPRAY | Freq: Every day | RESPIRATORY_TRACT | Status: DC

## 2022-07-18 MED ORDER — PANTOPRAZOLE SODIUM 40 MG PO TBEC
40.0000 mg | DELAYED_RELEASE_TABLET | Freq: Two times a day (BID) | ORAL | Status: DC
  Administered 2022-07-19 – 2022-08-24 (×72): 40 mg via ORAL
  Filled 2022-07-18 (×72): qty 1

## 2022-07-18 MED ORDER — ISOSORBIDE DINITRATE 10 MG PO TABS
5.0000 mg | ORAL_TABLET | Freq: Three times a day (TID) | ORAL | Status: DC
  Administered 2022-07-19: 5 mg via ORAL
  Filled 2022-07-18 (×7): qty 0.5

## 2022-07-18 MED ORDER — UMECLIDINIUM BROMIDE 62.5 MCG/ACT IN AEPB
1.0000 | INHALATION_SPRAY | Freq: Every day | RESPIRATORY_TRACT | Status: DC
  Administered 2022-07-20 – 2022-07-26 (×7): 1 via RESPIRATORY_TRACT
  Filled 2022-07-18: qty 7

## 2022-07-18 MED ORDER — CITALOPRAM HYDROBROMIDE 20 MG PO TABS
40.0000 mg | ORAL_TABLET | Freq: Every day | ORAL | Status: DC
  Administered 2022-07-19: 40 mg via ORAL
  Filled 2022-07-18: qty 2

## 2022-07-18 MED ORDER — ATORVASTATIN CALCIUM 20 MG PO TABS
40.0000 mg | ORAL_TABLET | Freq: Every day | ORAL | Status: DC
  Administered 2022-07-19 – 2022-08-24 (×36): 40 mg via ORAL
  Filled 2022-07-18 (×36): qty 2

## 2022-07-18 MED ORDER — ACETAMINOPHEN 325 MG PO TABS
650.0000 mg | ORAL_TABLET | Freq: Four times a day (QID) | ORAL | Status: DC | PRN
  Administered 2022-07-20 – 2022-08-16 (×22): 650 mg via ORAL
  Filled 2022-07-18 (×21): qty 2

## 2022-07-18 MED ORDER — APIXABAN 2.5 MG PO TABS
2.5000 mg | ORAL_TABLET | Freq: Two times a day (BID) | ORAL | Status: DC
  Administered 2022-07-19 – 2022-08-02 (×29): 2.5 mg via ORAL
  Filled 2022-07-18 (×29): qty 1

## 2022-07-18 NOTE — Assessment & Plan Note (Addendum)
Medications discontinued on 08/11/2022 

## 2022-07-18 NOTE — H&P (Signed)
History and Physical    Patient: Danielle [name] LAG:[id number] DOB: [birth year] DOA: 08/03/2022 DOS: the patient was seen and examined on 08/03/2022 PCP: [name], [place]  Patient coming from: Home  Chief Complaint:  Chief Complaint  Patient presents with   Shortness of Breath    HPI: Danielle [name] is a 66 y.o. female with medical history significant for Paroxysmal A-fib on chronic anticoagulation therapy, history of chronic systolic heart failure, history of adenocarcinoma of the right lung status post radiation therapy, history of stage IIIb chronic kidney disease, aortic valve stenosis, COPD with chronic respiratory failure on 4 L of oxygen continuous, history of left breast cancer status post mastectomy, history of esophageal cancer recurrent right-sided pleural effusion requiring thoracentesis Most recently on 06/19/2022 who presents to the ER for evaluation of worsening shortness of breath from her baseline.  She has associated chest tightness.  Denies one-sided leg pain numbness one-sided leg swelling .his symptoms feel similar to prior episodes when she required thoracentesis. ED course and data review: BP 135/99 with respirations 29 with O2 sat 90% on increased O2 flow rate of 50%.  Labs with troponin 15, BNP 2935.  Hemoglobin 9.7 (baseline 8.3-10.5) CMP pending, COVID and flu pending.  EKG, personally reviewed and interpreted showing A-fib at 106 and prolonged QT of 414 with no acute ischemic changes.  Chest x-ray shows the following: IMPRESSION: 1. Small right pleural effusion, increased since prior study. 2. Increasing right basilar consolidation, which may reflect airspace disease or progressive atelectasis. 3. Masslike linear consolidation right suprahilar region, unchanged since recent CT.   Patient given a dose of Lasix 40 mg IV.  Hospitalist consulted for admission.     Past Medical History:  Diagnosis Date   Acute respiratory failure due to COVID-19 ([place]) 09/27/2019    Anemia    Anxiety    Aortic atherosclerosis ([place])    Aortic valve stenosis 02/10/2018   a.) TTE 02/10/2018: EF 55-60%: mild AS with MPG of 12 mmHg. b.) TTE 04/21/2018: EF 35-40%; mild AS with MPG 8 mmHg. c.) TTE 11/07/2019: EF 50-55%; mild AS with MPG 10 mmHg.   Arthritis    Atrial fibrillation (HCC)    a.) CHA2DS2-VASc = 4 (age, sex, HTN, aortic plaque). b.) Rate/rhythm maintained on oral carvedilol; chronically anticoagulated on dose reduced apixaban. c.)  Attempted deployment of LAA occlusive device on 01/11/2021; parameters failed and procedure aborted.   Breast cancer, left ([place]) 2000   a.) T2N1M0; ER/PR (+) --> Tx'd with total mastectomy, LN resection, XRT, and chemotherapy   Cancer of right lung ([place]) 07/30/2016   a.) adenocarcinoma; ALK, ROS1, PDL1, BRAF, EGFR all negative.   CHF (congestive heart failure) (HCC)    Chronic diastolic CHF (congestive heart failure) ([place]) 11/26/2021   CKD (chronic kidney disease), stage IV (HCC)    COPD (chronic obstructive pulmonary disease) (HCC)    Dependence on supplemental oxygen    Depression    Diastolic dysfunction 10/07/8249   a.) TTE 02/10/2017: EF 55-60%; G2DD. b.) TTE 04/21/2018: EF 35-40%; mild LA dilation; mod MV regurgitation. c.) TTE 11/07/2019: EF 50-55%; G1DD.   DOE (dyspnea on exertion)    GERD (gastroesophageal reflux disease)    Heart murmur    History of 2019 novel coronavirus disease (COVID-19) 10/14/2019   HLD (hyperlipidemia)    Hypertension    Long term current use of anticoagulant    a.) apixaban   Lymphedema    Personal history of chemotherapy    Personal history of radiation  therapy    Respiratory tract infection due to COVID-19 virus 09/27/2019   SBO (small bowel obstruction) ([place]) 11/05/2020   Vitamin D deficiency    Past Surgical History:  Procedure Laterality Date   Breast Biospy Left    [facility]   BREAST SURGERY     CARDIOVERSION N/A 02/23/2022   Procedure: CARDIOVERSION;  Surgeon: Corey Skains, MD;   Location: [facility] ORS;  Service: Cardiovascular;  Laterality: N/A;   COLONOSCOPY N/A 04/30/2018   Procedure: COLONOSCOPY;  Surgeon: Virgel Manifold, MD;  Location: [facility] ENDOSCOPY;  Service: Endoscopy;  Laterality: N/A;   COLONOSCOPY N/A 07/22/2018   Procedure: COLONOSCOPY;  Surgeon: Virgel Manifold, MD;  Location: [facility] ENDOSCOPY;  Service: Endoscopy;  Laterality: N/A;   COLONOSCOPY WITH PROPOFOL N/A 09/21/2021   Procedure: COLONOSCOPY WITH PROPOFOL;  Surgeon: Benjamine Sprague, DO;  Location: [facility] ENDOSCOPY;  Service: General;  Laterality: N/A;   COLONOSCOPY WITH PROPOFOL N/A 03/11/2022   Procedure: COLONOSCOPY WITH PROPOFOL;  Surgeon: Annamaria Helling, DO;  Location: [facility] ENDOSCOPY;  Service: Gastroenterology;  Laterality: N/A;   DILATION AND CURETTAGE OF UTERUS     ELECTROMAGNETIC NAVIGATION BROCHOSCOPY Right 04/11/2016   Procedure: ELECTROMAGNETIC NAVIGATION BRONCHOSCOPY;  Surgeon: Vilinda Boehringer, MD;  Location: [facility] ORS;  Service: Cardiopulmonary;  Laterality: Right;   ESOPHAGOGASTRODUODENOSCOPY N/A 07/22/2018   Procedure: ESOPHAGOGASTRODUODENOSCOPY (EGD);  Surgeon: Virgel Manifold, MD;  Location: [facility] ENDOSCOPY;  Service: Endoscopy;  Laterality: N/A;   ESOPHAGOGASTRODUODENOSCOPY (EGD) WITH PROPOFOL N/A 05/07/2018   Procedure: ESOPHAGOGASTRODUODENOSCOPY (EGD) WITH PROPOFOL;  Surgeon: Lucilla Lame, MD;  Location: [facility] ENDOSCOPY;  Service: Endoscopy;  Laterality: N/A;   ESOPHAGOGASTRODUODENOSCOPY (EGD) WITH PROPOFOL N/A 04/24/2019   Procedure: ESOPHAGOGASTRODUODENOSCOPY (EGD) WITH PROPOFOL;  Surgeon: Jonathon Bellows, MD;  Location: [facility] END[facility]COPY;  Service: Gastroenterology;  Laterality: N/A;   ESOPHAGOGASTRODUODENOSCOPY (EGD) WITH PROPOFOL N/A 01/12/2020   Procedure: ESOPHAGOGASTRODUODENOSCOPY (EGD) WITH PROPOFOL;  Surgeon: Jonathon Bellows, MD;  Location: [facility] ENDOSCOPY;  Service: Gastroenterology;  Laterality: N/A;   ESOPHAGOGASTRODUODENOSCOPY (EGD) WITH PROPOFOL N/A 04/28/2020    Procedure: ESOPHAGOGASTRODUODENOSCOPY (EGD) WITH PROPOFOL;  Surgeon: Jonathon Bellows, MD;  Location: [facility] ENDOSCOPY;  Service: Gastroenterology;  Laterality: N/A;   ESOPHAGOGASTRODUODENOSCOPY (EGD) WITH PROPOFOL N/A 03/11/2022   Procedure: ESOPHAGOGASTRODUODENOSCOPY (EGD) WITH PROPOFOL;  Surgeon: Annamaria Helling, DO;  Location: [facility] ENDOSCOPY;  Service: Gastroenterology;  Laterality: N/A;   EUS N/A 05/07/2019   Procedure: FULL UPPER ENDOSCOPIC ULTRASOUND (EUS) RADIAL;  Surgeon: Jola Schmidt, MD;  Location: [facility] ENDOSCOPY;  Service: Endoscopy;  Laterality: N/A;   ILEOSCOPY N/A 07/22/2018   Procedure: ILEOSCOPY THROUGH STOMA;  Surgeon: Virgel Manifold, MD;  Location: [facility] ENDOSCOPY;  Service: Endoscopy;  Laterality: N/A;   ILEOSTOMY     ILEOSTOMY N/A 09/08/2018   Procedure: ILEOSTOMY REVISION POSSIBLE CREATION;  Surgeon: Herbert Pun, MD;  Location: [facility] ORS;  Service: General;  Laterality: N/A;   ILEOSTOMY CLOSURE N/A 08/15/2018   Procedure: DILATION OF ILEOSTOMY STRICTURE;  Surgeon: Herbert Pun, MD;  Location: [facility] ORS;  Service: General;  Laterality: N/A;   LAPAROTOMY Right 05/04/2018   Procedure: EXPLORATORY LAPAROTOMY right colectomy right and left ostomy;  Surgeon: Herbert Pun, MD;  Location: [facility] ORS;  Service: General;  Laterality: Right;   LEFT ATRIAL APPENDAGE OCCLUSION N/A 01/11/2021   Procedure: LEFT ATRIAL APPENDAGE OCCLUSION ([place]); ABORTED PROCEDURE WITHOUT DEVICE BEING IMPLANTED; Location: [facility]; Surgeon: Mylinda Latina, MD   LUNG BIOPSY     MASTECTOMY Left    2000, [place] CUFF REPAIR Right    [place]  XI ROBOTIC ASSISTED COLOSTOMY TAKEDOWN N/A 10/23/2021   Procedure: XI ROBOTIC ASSISTED ILEOSTOMY TAKEDOWN;  Surgeon: Herbert Pun, MD;  Location: [facility] ORS;  Service: General;  Laterality: N/A;  180 minutes for the surgery part please   Social History:  reports that she quit smoking about 10 years ago. Her smoking use  included cigarettes. She has a 10.00 pack-year smoking history. Her smokeless tobacco use includes snuff. She reports current alcohol use of about 3.0 standard drinks of alcohol per week. She reports that she does not use drugs.  No Known Allergies  Family History  Problem Relation Age of Onset   Breast cancer Mother 79   Cancer Mother        Breast    Cirrhosis Father    Breast cancer Paternal Aunt 44   Cancer Maternal Aunt        Breast     Prior to Admission medications   Medication Sig Start Date End Date Taking? Authorizing Provider  acetaminophen (TYLENOL) 325 MG tablet Take 2 tablets (650 mg total) by mouth every 6 (six) hours as needed for mild pain (or Fever >/= 101). Patient taking differently: Take 650 mg by mouth every 4 (four) hours as needed for moderate pain. 12/24/18   Gouru, Illene Silver, MD  albuterol (PROVENTIL HFA;VENTOLIN HFA) 108 (90 Base) MCG/ACT inhaler Inhale 2 puffs into the lungs every 6 (six) hours as needed for wheezing or shortness of breath. 04/24/18   Demetrios Loll, MD  amiodarone (PACERONE) 200 MG tablet Take 1 tablet (200 mg total) by mouth daily. 06/21/22   Lorella Nimrod, MD  apixaban (ELIQUIS) 2.5 MG TABS tablet Take 1 tablet (2.5 mg total) by mouth 2 (two) times daily. 05/26/22   Wyvonnia Dusky, MD  atorvastatin (LIPITOR) 40 MG tablet Take 1 tablet (40 mg total) by mouth daily. 02/03/22   Ezekiel Slocumb, DO  calcium carbonate (TUMS - DOSED IN MG ELEMENTAL CALCIUM) 500 MG chewable tablet Chew 0.5 tablets by mouth daily as needed. Patient not taking: Reported on 07/05/2022    [provider]  calcium-vitamin D (OSCAL WITH D) 500-5 MG-MCG tablet Take 1 tablet by mouth daily.    [provider]  citalopram (CELEXA) 20 MG tablet Take 1 tablet (20 mg total) by mouth daily. Patient taking differently: Take 40 mg by mouth daily. 02/04/22   Nicole Kindred A, DO  ferrous sulfate 325 (65 FE) MG tablet Take 325 mg by mouth 2 (two) times daily with a meal.     [provider]  Fluticasone-Umeclidin-Vilant (TRELEGY ELLIPTA) 100-62.5-25 MCG/ACT AEPB Inhale 1 puff into the lungs daily. 03/05/22   [provider]  hydrALAZINE (APRESOLINE) 10 MG tablet Take 1 tablet (10 mg total) by mouth 3 (three) times daily. 05/26/22   Wyvonnia Dusky, MD  isosorbide dinitrate (ISORDIL) 5 MG tablet Take 1 tablet (5 mg total) by mouth 3 (three) times daily. 05/26/22   Wyvonnia Dusky, MD  loperamide (IMODIUM) 2 MG capsule Take 2 mg by mouth daily as needed for diarrhea or loose stools.    [provider]  metoprolol tartrate (LOPRESSOR) 25 MG tablet Take 1 tablet (25 mg total) by mouth 2 (two) times daily. 02/03/22   Ezekiel Slocumb, DO  Multiple Vitamin (MULTIVITAMIN WITH MINERALS) TABS tablet Take 1 tablet by mouth daily.    [provider]  OXYGEN Inhale 4 L into the lungs daily.    [provider]  pantoprazole (PROTONIX) 40 MG tablet Take 1  tablet (40 mg total) by mouth 2 (two) times daily before a meal. 08/05/20   Pokhrel, Corrie Mckusick, MD  potassium chloride (KLOR-CON) 8 MEQ tablet Take by mouth. 07/05/22 07/05/23  [provider]  potassium chloride SA (KLOR-CON M) 20 MEQ tablet Take 2 tablets (40 mEq total) by mouth daily. Patient not taking: Reported on 07/05/2022 06/26/22   [name], FNP  predniSONE (DELTASONE) 5 MG tablet Take by mouth. 07/04/22   [provider]  simethicone (MYLICON) 80 MG chewable tablet Chew 1 tablet (80 mg total) by mouth every 6 (six) hours as needed for flatulence. 06/21/22   Lorella Nimrod, MD  sodium bicarbonate 650 MG tablet Take 1,300 mg by mouth daily.    [provider]  sucralfate (CARAFATE) 1 GM/10ML suspension Take 0.5 g by mouth 2 (two) times daily.    [provider]  Torsemide 40 MG TABS Take 20 mg by mouth daily. Hold this medication until you see your cardiologist 05/26/22   Wyvonnia Dusky, MD  zolpidem (AMBIEN) 5 MG tablet Take 5 mg by  mouth at bedtime as needed for sleep. Patient not taking: Reported on 07/05/2022 06/23/21   [provider]    Physical Exam: Vitals:   08/08/2022 1801 08/04/2022 1900  BP: (!) 135/99 (!) 131/99  Pulse: 95 (!) 114  Resp: (!) 29 19  Temp: 98.1 F (36.7 C) 98.5 F (36.9 C)  TempSrc: Oral Oral  SpO2: 90% 92%   Physical Exam Vitals and nursing note reviewed.  Constitutional:      General: She is not in acute distress.    Comments: Frail-appearing female in no distress  HENT:     Head: Normocephalic and atraumatic.  Cardiovascular:     Rate and Rhythm: Regular rhythm. Tachycardia present.     Heart sounds: Normal heart sounds.  Pulmonary:     Effort: Tachypnea present.     Breath sounds: Normal breath sounds.     Comments: Decreased air entry in bases Abdominal:     Palpations: Abdomen is soft.     Tenderness: There is no abdominal tenderness.  Neurological:     Mental Status: Mental status is at baseline.     Labs on Admission: I have personally reviewed following labs and imaging studies  CBC: Recent Labs  Lab 07/23/2022 1810  WBC 3.9*  NEUTROABS 3.1  HGB 9.7*  HCT 29.9*  MCV 96.8  PLT 536   Basic Metabolic Panel: No results for input(s): "NA", "K", "CL", "CO2", "GLUCOSE", "BUN", "CREATININE", "CALCIUM", "MG", "PHOS" in the last 168 hours. GFR: CrCl cannot be calculated (Unknown ideal weight.). Liver Function Tests: No results for input(s): "AST", "ALT", "ALKPHOS", "BILITOT", "PROT", "ALBUMIN" in the last 168 hours. No results for input(s): "LIPASE", "AMYLASE" in the last 168 hours. No results for input(s): "AMMONIA" in the last 168 hours. Coagulation Profile: No results for input(s): "INR", "PROTIME" in the last 168 hours. Cardiac Enzymes: No results for input(s): "CKTOTAL", "CKMB", "CKMBINDEX", "TROPONINI" in the last 168 hours. BNP (last 3 results) No results for input(s): "PROBNP" in the last 8760 hours. HbA1C: No results for input(s): "HGBA1C" in  the last 72 hours. CBG: No results for input(s): "GLUCAP" in the last 168 hours. Lipid Profile: No results for input(s): "CHOL", "HDL", "LDLCALC", "TRIG", "CHOLHDL", "LDLDIRECT" in the last 72 hours. Thyroid Function Tests: No results for input(s): "TSH", "T4TOTAL", "FREET4", "T3FREE", "THYROIDAB" in the last 72 hours. Anemia Panel: No results for input(s): "VITAMINB12", "FOLATE", "FERRITIN", "TIBC", "IRON", "RETICCTPCT" in  the last 72 hours. Urine analysis:    Component Value Date/Time   COLORURINE YELLOW 07/05/2022 1132   APPEARANCEUR CLEAR (A) 07/05/2022 1132   APPEARANCEUR Clear 12/06/2013 2245   LABSPEC 1.015 07/05/2022 1132   LABSPEC 1.015 12/06/2013 2245   PHURINE 5.0 07/05/2022 1132   GLUCOSEU NEGATIVE 07/05/2022 1132   GLUCOSEU Negative 12/06/2013 2245   HGBUR TRACE (A) 07/05/2022 1132   BILIRUBINUR NEGATIVE 07/05/2022 1132   BILIRUBINUR Negative 12/06/2013 2245   KETONESUR NEGATIVE 07/05/2022 1132   PROTEINUR 100 (A) 07/05/2022 1132   NITRITE NEGATIVE 07/05/2022 1132   LEUKOCYTESUR NEGATIVE 07/05/2022 1132   LEUKOCYTESUR Negative 12/06/2013 2245    Radiological Exams on Admission: DG Chest Port 1 View  Result Date: 07/28/2022 CLINICAL DATA:  Short of breath, left breast cancer EXAM: PORTABLE CHEST 1 VIEW COMPARISON:  06/19/2022, 06/16/2022 FINDINGS: Single frontal view of the chest demonstrates stable enlargement of the cardiac silhouette. There is a small right pleural effusion, increased since prior study. Increased consolidation at the right lung base may reflect atelectasis or airspace disease. Stable linear consolidation right suprahilar region. No pneumothorax. No acute bony abnormality. IMPRESSION: 1. Small right pleural effusion, increased since prior study. 2. Increasing right basilar consolidation, which may reflect airspace disease or progressive atelectasis. 3. Masslike linear consolidation right suprahilar region, unchanged since recent CT. Electronically  Signed   By: Randa Ngo M.D.   On: 08/09/2022 18:32     Data Reviewed: Relevant notes from primary care and specialist visits, past discharge summaries as available in EHR, including Care Everywhere. Prior diagnostic testing as pertinent to current admission diagnoses Updated medications and problem lists for reconciliation ED course, including vitals, labs, imaging, treatment and response to treatment Triage notes, nursing and pharmacy notes and ED provider's notes Notable results as noted in HPI   Assessment and Plan: Recurrent right pleural effusion Acute on chronic systolic heart failure Acute on chronic respiratory failure with hypoxia Patient presents with shortness of breath, dyspnea on exertion with chest discomfort, desaturating to the low 80s on her baseline flow rate Imaging shows increasing right pleural effusion from prior, BNP over 2000 Last known LVEF of 35 to 40% with moderate aortic stenosis Continue IV Lasix.  Continue metoprolol IR consult for thoracentesis Continue oxygen supplementation  Paroxysmal atrial fibrillation (HCC) Continue amiodarone for rate control as well as low-dose beta-blockers Continue apixaban as primary prophylaxis for an acute stroke  Malignant neoplasm of upper lobe of right lung Malignant neoplasm of lower third of esophagus Patient is status post radiation therapy for esophageal cancer S/p radiation therapy for lung cancer Follow-up with oncology  Anemia of chronic disease CKD stage IV Monitor renal function during hospitalization(CMP pending at the time of this dictation) Hemoglobin at baseline at 9.7  Chronic obstructive pulmonary disease ([place]) Patient has a history of severe COPD with chronic respiratory failure on 4 L of oxygen continuous Place patient on as needed and scheduled bronchodilator therapy Continue inhaled steroids        DVT prophylaxis: Eliquis  Consults: IR  For thoracentesis  Advance Care  Planning:   Code Status: Prior   Family Communication: none  Disposition Plan: Back to previous home environment  Severity of Illness: The appropriate patient status for this patient is INPATIENT. Inpatient status is judged to be reasonable and necessary in order to provide the required intensity of service to ensure the patient's safety. The patient's presenting symptoms, physical exam findings, and initial radiographic and laboratory data in the context of  their chronic comorbidities is felt to place them at high risk for further clinical deterioration. Furthermore, it is not anticipated that the patient will be medically stable for discharge from the hospital within 2 midnights of admission.   * I certify that at the point of admission it is my clinical judgment that the patient will require inpatient hospital care spanning beyond 2 midnights from the point of admission due to high intensity of service, high risk for further deterioration and high frequency of surveillance required.*  Author: Athena Masse, MD 07/29/2022 7:32 PM  For on call review [email or web address].

## 2022-07-18 NOTE — ED Provider Notes (Signed)
[facility] Provider Note   Event Date/Time   First MD Initiated Contact with Patient 07/24/2022 1757     (approximate) History  Shortness of Breath  HPI Danielle [name] is a 66 y.o. female with a stated past medical history of atrial fibrillation on Eliquis, CHF, and COPD chronically on 4 L who presents for worsening shortness of breath.  Patient states that she has had history of pleural effusions in the past that need to be drained and this episode feels similar to previous.  Patient states that this will be the fourth time that she has had to have fluid drawn off of her lungs.  Patient endorses worsening shortness of breath on exertion and denies any relieving factors except for oxygen by her nasal cannula.  Patient does endorse associated chest tightness. ROS: Patient currently denies any vision changes, tinnitus, difficulty speaking, facial droop, sore throat, abdominal pain, nausea/vomiting/diarrhea, dysuria, or weakness/numbness/paresthesias in any extremity   Physical Exam  Triage Vital Signs: ED Triage Vitals  Enc Vitals Group     BP      Pulse      Resp      Temp      Temp src      SpO2      Weight      Height      Head Circumference      Peak Flow      Pain Score      Pain Loc      Pain Edu?      Excl. in [place]?    Most recent vital signs: Vitals:   07/21/2022 2000 07/30/2022 2200  BP: (!) 136/103 117/79  Pulse: 100 83  Resp: (!) 24 19  Temp:  98.7 F (37.1 C)  SpO2: 92% 97%   General: Awake, oriented x4. CV:  Good peripheral perfusion.  Resp:  Normal effort.  Distant breath sounds on the right lower lung fields Abd:  No distention.  Other:  Elderly African-American female laying in bed in mild respiratory distress ED Results / Procedures / Treatments  Labs (all labs ordered are listed, but only abnormal results are displayed) Labs Reviewed  CBC WITH DIFFERENTIAL/PLATELET - Abnormal; Notable for the following components:      Result  Value   WBC 3.9 (*)    RBC 3.09 (*)    Hemoglobin 9.7 (*)    HCT 29.9 (*)    RDW 16.0 (*)    nRBC 1.8 (*)    Lymphs Abs 0.6 (*)    All other components within normal limits  BRAIN NATRIURETIC PEPTIDE - Abnormal; Notable for the following components:   B Natriuretic Peptide 2,935.0 (*)    All other components within normal limits  COMPREHENSIVE METABOLIC PANEL - Abnormal; Notable for the following components:   Glucose, Bld 177 (*)    BUN 45 (*)    Creatinine, Ser 2.38 (*)    Calcium 8.8 (*)    GFR, Estimated 22 (*)    All other components within normal limits  RESP PANEL BY RT-PCR (FLU A&B, COVID) [id number]  TROPONIN I (HIGH SENSITIVITY)  TROPONIN I (HIGH SENSITIVITY)   EKG ED ECG REPORT I, Naaman Plummer, the attending physician, personally viewed and interpreted this ECG. Date: 08/14/2022 EKG Time: 1801 Rate: 106 Rhythm: Atrial fibrillation QRS Axis: normal Intervals: normal ST/T Wave abnormalities: normal Narrative Interpretation: no evidence of acute ischemia RADIOLOGY ED MD interpretation: Single view portable chest x-ray interpreted by me and  shows recurrent right pleural effusion increased from prior study as well as increasing right basilar consolidation.  There is a masslike linear consolidation in the right suprahilar region unchanged since recent CT -Agree with radiology assessment Official radiology report(s): DG Chest Port 1 View  Result Date: 08/05/2022 CLINICAL DATA:  Short of breath, left breast cancer EXAM: PORTABLE CHEST 1 VIEW COMPARISON:  06/19/2022, 06/16/2022 FINDINGS: Single frontal view of the chest demonstrates stable enlargement of the cardiac silhouette. There is a small right pleural effusion, increased since prior study. Increased consolidation at the right lung base may reflect atelectasis or airspace disease. Stable linear consolidation right suprahilar region. No pneumothorax. No acute bony abnormality. IMPRESSION: 1. Small right pleural effusion,  increased since prior study. 2. Increasing right basilar consolidation, which may reflect airspace disease or progressive atelectasis. 3. Masslike linear consolidation right suprahilar region, unchanged since recent CT. Electronically Signed   By: Randa Ngo M.D.   On: 07/24/2022 18:32   PROCEDURES: Critical Care performed: Yes, see critical care procedure note(s) .1-3 Lead EKG Interpretation  Performed by: Naaman Plummer, MD Authorized by: Naaman Plummer, MD     Interpretation: abnormal     ECG rate:  92   ECG rate assessment: normal     Rhythm: atrial fibrillation     Ectopy: none     Conduction: normal   CRITICAL CARE Performed by: Naaman Plummer  Total critical care time: 31 minutes  Critical care time was exclusive of separately billable procedures and treating other patients.  Critical care was necessary to treat or prevent imminent or life-threatening deterioration.  Critical care was time spent personally by me on the following activities: development of treatment plan with patient and/or surrogate as well as nursing, discussions with consultants, evaluation of patient's response to treatment, examination of patient, obtaining history from patient or surrogate, ordering and performing treatments and interventions, ordering and review of laboratory studies, ordering and review of radiographic studies, pulse oximetry and re-evaluation of patient's condition.  MEDICATIONS ORDERED IN ED: Medications  amiodarone (PACERONE) tablet 200 mg (has no administration in time range)  atorvastatin (LIPITOR) tablet 40 mg (has no administration in time range)  hydrALAZINE (APRESOLINE) tablet 10 mg (has no administration in time range)  isosorbide dinitrate (ISORDIL) tablet 5 mg (has no administration in time range)  metoprolol tartrate (LOPRESSOR) tablet 50 mg (50 mg Oral Given 07/22/2022 2219)  citalopram (CELEXA) tablet 40 mg (has no administration in time range)  pantoprazole (PROTONIX)  EC tablet 40 mg (has no administration in time range)  simethicone (MYLICON) chewable tablet 80 mg (has no administration in time range)  sodium bicarbonate tablet 1,300 mg (has no administration in time range)  albuterol (PROVENTIL) (2.5 MG/3ML) 0.083% nebulizer solution 2.5 mg (has no administration in time range)  acetaminophen (TYLENOL) tablet 650 mg (has no administration in time range)    Or  acetaminophen (TYLENOL) suppository 650 mg (has no administration in time range)  ondansetron (ZOFRAN) tablet 4 mg (has no administration in time range)    Or  ondansetron (ZOFRAN) injection 4 mg (has no administration in time range)  furosemide (LASIX) injection 40 mg (40 mg Intravenous Not Given 07/20/2022 2125)  HYDROcodone-acetaminophen (NORCO/VICODIN) 5-325 MG per tablet 1-2 tablet (has no administration in time range)  apixaban (ELIQUIS) tablet 2.5 mg (has no administration in time range)  fluticasone furoate-vilanterol (BREO ELLIPTA) 100-25 MCG/ACT 1 puff (has no administration in time range)    And  umeclidinium bromide (INCRUSE ELLIPTA) 62.5  MCG/ACT 1 puff (has no administration in time range)  furosemide (LASIX) injection 40 mg (40 mg Intravenous Given 07/30/2022 1927)   IMPRESSION / MDM / ASSESSMENT AND PLAN / ED COURSE  I reviewed the triage vital signs and the nursing notes.                             Differential diagnosis includes, but is not limited to, effusion, pulmonary edema, PE, CHF exacerbation, COPD exacerbation The patient is on the cardiac monitor to evaluate for evidence of arrhythmia and/or significant heart rate changes. Patient's presentation is most consistent with acute presentation with potential threat to life or bodily function. Endorses dyspnea, endorses LE edema Denies Non adherence to medication regimen  Workup: ECG, CBC, CMP, Troponin, BNP, CXR Findings: EKG: No STEMI and no evidence of Brugadas sign, delta wave, epsilon wave, significantly prolonged QTc, or  malignant arrhythmia. BNP: 2935 CXR: Right pleural effusion and associated pulmonary edema Based on history, exam and findings, presentation most consistent with acute on chronic heart failure. Low suspicion for PNA, ACS, tamponade, aortic dissection. Interventions: Oxygen, Diuresis  Reassessment: Symptoms improved in ED with oxygen and diuresis however will require thoracentesis for resolution of this effusion  Disposition (Stable but not significantly improved): Admit to medicine for further monitoring and for improvement of medication regimen to control symptoms.    FINAL CLINICAL IMPRESSION(S) / ED DIAGNOSES   Final diagnoses:  Pleural effusion on right  Acute on chronic respiratory failure with hypoxia ([place])   Rx / DC Orders   ED Discharge Orders     None      Note:  This document was prepared using Dragon voice recognition software and may include unintentional dictation errors.   Naaman Plummer, MD 07/23/2022 [phone]

## 2022-07-18 NOTE — ED Triage Notes (Signed)
Hx COPD, CHF, Afib,  Breast cancer on left. Here last month for same, had fluid drained off lungs. Increased O2 demand. Baseline 4L

## 2022-07-18 NOTE — Assessment & Plan Note (Addendum)
Last hemoglobin 10.1

## 2022-07-18 NOTE — Assessment & Plan Note (Addendum)
Severe COPD with chronic respiratory failure on 4 L of continuous O2 at home.

## 2022-07-18 NOTE — ED Notes (Signed)
Pt up to restroom with assistance. Pt short of breath with minimal exertion. Pt sating in the low 80's when returned to stretcher despite nonstop oxygen use. Pt recovered to O2 sats in the 90's after sitting for several minutes.

## 2022-07-18 NOTE — Assessment & Plan Note (Addendum)
Malignant neoplasm of lower third of esophagus Patient is status post radiation therapy for esophageal cancer S/p radiation therapy for lung cancer

## 2022-07-18 NOTE — Assessment & Plan Note (Deleted)
Chart reviewed: prior thoracenteses done this year on 1/30, 2/8, 4/16, 5/12, 7/25, 8/8, 9/5 --Underwent thoracentesis on 10/5 with 900 cc fluid removed. -Critical care team removed the right chest tube on 08/12/2022.  Fluid will likely reaccumulate.  Critical care team does not recommend replacing chest tube.

## 2022-07-19 ENCOUNTER — Inpatient Hospital Stay: Payer: Medicare Other

## 2022-07-19 ENCOUNTER — Other Ambulatory Visit: Payer: Self-pay

## 2022-07-19 DIAGNOSIS — I5043 Acute on chronic combined systolic (congestive) and diastolic (congestive) heart failure: Secondary | ICD-10-CM

## 2022-07-19 DIAGNOSIS — R9431 Abnormal electrocardiogram [ECG] [EKG]: Secondary | ICD-10-CM | POA: Diagnosis present

## 2022-07-19 LAB — FUNGUS CULTURE WITH STAIN

## 2022-07-19 LAB — FUNGUS CULTURE RESULT

## 2022-07-19 LAB — TROPONIN I (HIGH SENSITIVITY): Troponin I (High Sensitivity): 19 ng/L — ABNORMAL HIGH (ref <18)

## 2022-07-19 LAB — FUNGAL ORGANISM REFLEX

## 2022-07-19 MED ORDER — CALCIUM CARBONATE ANTACID 500 MG PO CHEW
1.0000 | CHEWABLE_TABLET | Freq: Three times a day (TID) | ORAL | Status: DC | PRN
  Administered 2022-07-19 – 2022-07-24 (×2): 200 mg via ORAL
  Filled 2022-07-19 (×4): qty 1

## 2022-07-19 MED ORDER — CITALOPRAM HYDROBROMIDE 20 MG PO TABS
20.0000 mg | ORAL_TABLET | Freq: Every day | ORAL | Status: DC
  Administered 2022-07-20 – 2022-08-24 (×36): 20 mg via ORAL
  Filled 2022-07-19 (×36): qty 1

## 2022-07-19 NOTE — Assessment & Plan Note (Addendum)
Discontinue telemetry

## 2022-07-19 NOTE — Progress Notes (Addendum)
Progress Note   Patient: Danielle [name] DOB: 1956/07/14 DOA: 07/15/2022     2 DOS: the patient was seen and examined on 07/20/2022   Brief hospital course: Danielle [name] is a 66 y.o. female with medical history significant for Paroxysmal A-fib on chronic anticoagulation therapy, history of chronic systolic heart failure, history of adenocarcinoma of the right lung status post radiation therapy, history of stage IIIb chronic kidney disease, aortic valve stenosis, COPD with chronic respiratory failure on 4 L of oxygen continuous, history of left breast cancer status post mastectomy, history of esophageal cancer recurrent right-sided pleural effusion requiring thoracentesis, most recently on 06/19/2022 who presented to the ER on 08/01/2022 for evaluation of worsening shortness of breath with associated chest tightness.    ED course and evaluation --- BP 135/99 with RR 29 with O2 sat 90% on increased O2 flow rate of 5 L/min.  Labs with troponin 15>>17, BNP 2935.  Treated with IV Lasix in the ED.  Chest x-ray showed -- Small right pleural effusion, increased since prior study.  Increasing right basilar consolidation, which may reflect airspace disease or progressive atelectasis.  Mass-like linear consolidation right suprahilar region, unchanged since recent CT.  Patient was admitted to hospitalist service and IV diuresis was continued.    IR was consulted for thoracentesis which was done on 10/5 with 900 cc fluid removed.    Assessment and Plan: * Acute on chronic combined systolic and diastolic CHF (congestive heart failure) (HCC) Echo 01/30/22 -- EF 35-40% with regional WMA's, grade II diastolic dysfunction, moderate MR, mod-severe TR, moderate AS. Presented with recurrent pleural effusion and progressively worsening dyspnea. --IR for thoracentesis --Stop IV Lasix given Cr rising --Strict I/O's, daily weights --Monitor renal function, electrolytes   Acute on chronic respiratory  failure with hypoxia (HCC) Baseline on 4 L/min continuous O2. O2 sats on admission were only 90% on 5 l/min in setting of decompensated CHF with recurrent pleural effusion. --Supplement O2 to keep sats 88-94% --Mgmt of underlying issues as outlined   Recurrent right pleural effusion Presented with shortness of breath, dyspnea on exertion with chest discomfort, desaturating to the low 80s on her baseline flow rate.  Chest xray with increasing right pleural effusion from prior, BNP over 2000.   Chart reviewed: prior thoracenteses done this year on 1/30, 2/8, 4/16, 5/12, 7/25, 8/8, 9/5 --IR was consulted on admission. --Underwent thoracentesis on 10/5 with 900 cc fluid removed. --On diuresis as outlined --Monitor respiratory status  Paroxysmal atrial fibrillation (HCC) Continue amiodarone for rate control as well as low-dose beta-blockers Continue apixaban as primary prophylaxis for an acute stroke  Acute kidney injury superimposed on CKD (HCC) Baseline CKD stage 4, cr was stable on admission 1.84, but increased to 2.38 after IV diuresis. --Stop further Lasix for now --Monitor BMP  --Avoid nephrotoxins and renally dose meds  Essential hypertension Continue hydralazine On IV Lasix, hold torsemide  CKD (chronic kidney disease), stage IV ([place]) Now with AKI likely due to diuresis.  See AKI for plan.  Prolonged QT interval QTc was 592 on EKG on arrival.   Pt is on amiodarone, Celexa - continue for now. --Continue Eliquis 2.5 mg BID --Continue amiodarone 200 mg daily --Serial EKG's to monitor --Monitor & replace electrolytes, keep K>4, Mg>2 --Telemetry monitoring  Malignant neoplasm of upper lobe of right lung Malignant neoplasm of lower third of esophagus Patient is status post radiation therapy for esophageal cancer S/p radiation therapy for lung cancer Follow-up with oncology  HLD (hyperlipidemia)  On Lipitor  Anemia of chronic disease CKD stage IV Monitor renal function  during hospitalization(CMP pending at the time of this dictation) Hemoglobin at baseline at 9.7  Chronic obstructive pulmonary disease (HCC) Severe COPD with chronic respiratory failure on 4 L of continuous O2 at home. --Continue scheduled and PRN bronchodilators --Continue inhaled steroids  Depression Continue Celexa 20 mg daily  Carcinoma of overlapping sites of left breast in female, estrogen receptor positive ([place]) Noted. No acute issues.        Subjective: Patient seen in the ED this morning, holding for bed.  She is agreeable to thoracentesis and asks on her coming to the hospital to have fluid taken off reports breathing slightly improved since admission.  Denies other acute complaints at this time  Physical Exam: Vitals:   07/19/22 2109 07/19/22 2316 07/20/22 0400 07/20/22 0830  BP: 92/75 106/79 100/67 96/72  Pulse:  (!) 57 85 69  Resp:  18 18 18   Temp:  97.7 F (36.5 C) 97.9 F (36.6 C) (!) 97.5 F (36.4 C)  TempSrc:   Oral   SpO2:  100% 100% 99%  Weight:       General exam: awake, alert, no acute distress HEENT: moist mucus membranes, hearing grossly normal  Respiratory system: CTAB with diminished bases, no wheezes, normal respiratory effort at rest on 5 L/min [place] O2. Cardiovascular system: normal Z3/Y8,  RRR, systolic murmur noted, no peripheral edema Gastrointestinal system: soft, NT, ND Central nervous system: A&O x3. no gross focal neurologic deficits, normal speech Extremities: moves all, no edema, normal tone Skin: dry, intact, normal temperature Psychiatry: normal mood, congruent affect, judgement and insight appear normal   Data Reviewed:  Notable labs: Creatinine 2.38 up from 1.84 , BNP 2935, repeat troponin 19 from 17 stable, glucose 177  Family Communication: None at bedside, will attempt to call  Disposition: Status is: Inpatient Remains inpatient appropriate because: Severity of illness with ongoing oxygen requirement above baseline,  worsening renal function requiring close monitoring given patient is likely still volume overloaded    Planned Discharge Destination: Home    Time spent: 45 minutes  Author: Ezekiel Slocumb, DO 07/20/2022 8:53 AM  For on call review [email or web address].

## 2022-07-19 NOTE — Assessment & Plan Note (Addendum)
Discontinued Celexa

## 2022-07-19 NOTE — Assessment & Plan Note (Addendum)
Baseline on 4 L/min continuous O2. This morning she was on heated high flow nasal cannula 100% oxygen and 60 L flow.  Patient with borderline saturations in the 80s.

## 2022-07-19 NOTE — Hospital Course (Addendum)
Danielle [name] is a 66 y.o. female with medical history significant for Paroxysmal A-fib on chronic anticoagulation therapy, history of chronic systolic heart failure, history of adenocarcinoma of the right lung status post radiation therapy, history of stage IIIb chronic kidney disease, aortic valve stenosis, COPD with chronic respiratory failure on 4 L of oxygen continuous, history of left breast cancer status post mastectomy, history of esophageal cancer recurrent right-sided pleural effusion requiring thoracentesis, most recently on 06/19/2022 who presented to the ER on 08/07/2022 for evaluation of worsening shortness of breath with associated chest tightness.    ED course and evaluation --- BP 135/99 with RR 29 with O2 sat 90% on increased O2 flow rate of 5 L/min.  Labs with troponin 15>>17, BNP 2935.  Treated with IV Lasix in the ED.  Chest x-ray showed -- Small right pleural effusion, increased since prior study.  Increasing right basilar consolidation, which may reflect airspace disease or progressive atelectasis.  Mass-like linear consolidation right suprahilar region, unchanged since recent CT.  Patient was admitted to hospitalist service and IV diuresis was continued.    IR was consulted for thoracentesis which was done on 10/5 with 900 cc fluid removed.   Hospital course complicated by: --Acute kidney injury after diuresis due to cardiorenal physiology.  Nephrology was consulted.   Renal function improved with diuretics held, but pt with persistent and worsening exertional dyspnea again. --Abdominal pain for which evaluation including abdominal xray and CT abdomen/pelvis was unremarkable. --Swallowing difficulties with severe esophageal dysmotility discovered on barium esophagram.    10/19.  Patient developed worsening hypoxemia, currently on high flow oxygen.  We will place a Pleurx catheter as patient pleural effusion has reaccumulated, planned on Monday. Also restarted Lasix drip with  milrinone after cardiology consult. 10/20.  Lasix discontinued as patient renal function is getting worse. 10/24.  Paracentesis performed, hemodialysis is planned.   10/25.  Called with blood pressure in the 60s.  Fluid bolus given.  Lasix and spironolactone discontinued.  We will start midodrine.  Yesterday with palliative care they did not want pressors.  Called patient's daughter with multiorgan failure and increasing oxygen requirements.  Case discussed with palliative care.  10/26 through 10/27 patient still has cardiogenic shock, still on heated high flow nasal cannula, still with acute kidney injury.  10/28.  Critical care team would like to remove the chest tube but patient does not want to remove chest tube at this time.  Increased risk of infection explained.  10/29.  Critical care team removed chest tube  10/30.  Patient with increased oxygen requirements on 61% FiO2 heated high flow nasal cannula.  10/31.  Patient complained of chest pain.  Cardiology did not want to do any aggressive management.  Morphine given.  Troponins with borderline elevation but flat.  Patient still on milrinone drip.  Fluid leaking out of the chest tube site.  Also has poor appetite.  He still on heated high flow nasal cannula.  As Dr. Jimmye Norman 11/1-11/7/23: Pt has made no progress this week. Pt is still requiring HFNC and cannot be weaned off. Milrinone was d/c on 08/16/22 as per cardio recs. Cardio has signed off as they do not have any further recommendations. Palliative care has had multiple discussions w/ the pt and pt's family in regards to comfort care only but pt continues to refuse. I too have discussed comfort care w/ the pt.  Pt also refuses HD and a feeding tube. See previous progress/consult notes for more information.  08/23/2022.  Patient on heated high flow nasal cannula 100% FiO2 and 50 L flow with borderline saturations in the 80s.  Patient still wants to continue current management and does  not want to do comfort care currently.  11/10.  In the afternoon patient using accessory muscles to breathe and asked to be made at peace.  Comfort measures ordered.  Starting Dilaudid drip.  Spoke with family on the phone.  11/11.  Continue dilaudid drip for comfort care measures.  SBP in the 60's.    Patient passed away at 10:06 AM on Dilaudid drip and comfort care measures.

## 2022-07-19 NOTE — Procedures (Signed)
PROCEDURE SUMMARY:  Successful US guided right thoracentesis. Yielded 900 mL of amber colored fluid. Pt tolerated procedure well. No immediate complications.  Specimen was not sent for labs. CXR ordered.  EBL < 1 mL  Tsosie Billing D PA-C 07/19/2022 11:07 AM

## 2022-07-19 NOTE — Assessment & Plan Note (Deleted)
Now off IV Lasix  Hold hydralazine, metprolol, Isordil for now (BP's soft even with these all being held) Hold torsemide (AKI), resume diuretics per nephrology. Started on low dose midodrine.

## 2022-07-19 NOTE — ED Notes (Signed)
Assisted to reposition pt onto right side.   PureWick remains in place.

## 2022-07-19 NOTE — Assessment & Plan Note (Addendum)
Last creatinine 5.91

## 2022-07-19 NOTE — Assessment & Plan Note (Addendum)
Discontinued Lipitor

## 2022-07-19 NOTE — ED Notes (Signed)
RN notified provider of pt bp. Per MD, pt MAP is stable and to send pt to the floor.

## 2022-07-19 NOTE — Assessment & Plan Note (Addendum)
Echo 01/30/22 -- EF 35-40%, grade II diastolic dysfunction, moderate MR, mod-severe TR, moderate AS.

## 2022-07-19 NOTE — ED Notes (Signed)
Patient transported to US 

## 2022-07-19 NOTE — Assessment & Plan Note (Addendum)
Noted  

## 2022-07-19 NOTE — Assessment & Plan Note (Deleted)
Developed AKI likely due to diuresis, cardiorenal physiology. Renal function improved with holding diuretics See AKI for plan. Nephrology following.

## 2022-07-20 ENCOUNTER — Inpatient Hospital Stay: Payer: Medicare Other

## 2022-07-20 DIAGNOSIS — I5043 Acute on chronic combined systolic (congestive) and diastolic (congestive) heart failure: Secondary | ICD-10-CM | POA: Diagnosis not present

## 2022-07-20 LAB — CBC
HCT: 28.7 % — ABNORMAL LOW (ref 36.0–46.0)
Hemoglobin: 9.5 g/dL — ABNORMAL LOW (ref 12.0–15.0)
MCH: 31.1 pg (ref 26.0–34.0)
MCHC: 33.1 g/dL (ref 30.0–36.0)
MCV: 94.1 fL (ref 80.0–100.0)
Platelets: 195 10*3/uL (ref 150–400)
RBC: 3.05 MIL/uL — ABNORMAL LOW (ref 3.87–5.11)
RDW: 15.8 % — ABNORMAL HIGH (ref 11.5–15.5)
WBC: 3.6 10*3/uL — ABNORMAL LOW (ref 4.0–10.5)
nRBC: 1.6 % — ABNORMAL HIGH (ref 0.0–0.2)

## 2022-07-20 LAB — BASIC METABOLIC PANEL
Anion gap: 11 (ref 5–15)
BUN: 50 mg/dL — ABNORMAL HIGH (ref 8–23)
CO2: 25 mmol/L (ref 22–32)
Calcium: 8.9 mg/dL (ref 8.9–10.3)
Chloride: 105 mmol/L (ref 98–111)
Creatinine, Ser: 2.77 mg/dL — ABNORMAL HIGH (ref 0.44–1.00)
GFR, Estimated: 18 mL/min — ABNORMAL LOW (ref >60)
Glucose, Bld: 84 mg/dL (ref 70–99)
Potassium: 3.8 mmol/L (ref 3.5–5.1)
Sodium: 141 mmol/L (ref 135–145)

## 2022-07-20 LAB — MAGNESIUM: Magnesium: 1.7 mg/dL (ref 1.7–2.4)

## 2022-07-20 MED ORDER — POLYETHYLENE GLYCOL 3350 17 G PO PACK
17.0000 g | PACK | Freq: Every day | ORAL | Status: DC
  Administered 2022-07-20 – 2022-08-20 (×14): 17 g via ORAL
  Filled 2022-07-20 (×19): qty 1

## 2022-07-20 MED ORDER — METOPROLOL TARTRATE 25 MG PO TABS: 12.5000 mg | ORAL_TABLET | Freq: Two times a day (BID) | ORAL | Status: DC

## 2022-07-20 MED ORDER — IOHEXOL 9 MG/ML PO SOLN
500.0000 mL | ORAL | Status: AC
  Administered 2022-07-20 (×2): 500 mL via ORAL

## 2022-07-20 MED ORDER — LACTULOSE 10 GM/15ML PO SOLN
10.0000 g | Freq: Once | ORAL | Status: AC
  Administered 2022-07-20: 10 g via ORAL
  Filled 2022-07-20: qty 30

## 2022-07-20 MED ORDER — SIMETHICONE 80 MG PO CHEW
160.0000 mg | CHEWABLE_TABLET | Freq: Once | ORAL | Status: AC
  Administered 2022-07-20: 160 mg via ORAL
  Filled 2022-07-20: qty 2

## 2022-07-20 MED ORDER — SODIUM CHLORIDE 0.9% FLUSH
3.0000 mL | Freq: Two times a day (BID) | INTRAVENOUS | Status: DC
  Administered 2022-07-20 – 2022-08-24 (×68): 3 mL via INTRAVENOUS

## 2022-07-20 NOTE — Consult Note (Signed)
   Heart Failure Nurse Navigator Note  HFrEF 35-40%.  Grade 2 diastolic dysfunction.  Right ventricular systolic function low normal.  Moderate mitral valve regurgitation.  Moderate tricuspid valve regurgitation. moderate aortic stenosis.  She presents to the emergency room with complaints of worsening shortness of breath.  Chest x-ray revealed small right pleural effusion.  BNP was 2935.  Comorbidities:  COPD History of lung and esophageal cancer Paroxysmal atrial fibrillation Chronic kidney disease Chronic respiratory failure on 4 L O2 at home  Medications:  Amiodarone 200 mg daily Eliquis 2.5 mg daily Atorvastatin 40 mg daily Isosorbide dinitrate 5 mg 3 times a day Metoprolol tartrate 50 mg 2 times a day  Labs:  Sodium 141, potassium 3.8, chloride 105, CO2 25, BUN 50, creatinine 2.77, GFR 18 weight is 50.3 kg Blood pressure 95/76   Initial meeting with patient on this admission.  Underwent thoracentesis to remove 900 mL fluid from her right lung.  Discussed how she takes care of herself at home.  She states that she weighs herself daily and had not noticed a weight gain.  She states that she does not use salt at the table.  Eats at restaurants quite frequently.  Discussed fluid restriction, she was unaware that nephrology had given her fluid restriction of approximately 30 ounces daily.  She was unable to tell me how much she does drink during the day, that includes water,juices, tea, coffee, sodas and beer.  She goes on to state that the doctors have told her they do not understand why she keeps coming in with the recurrent pleural effusions, felt the cause of the recurrent effusions was unknown.  She is scheduled for follow up in the heart failure clinic on October 13 at 2{PM. She is a 16% no show -  She had no further questions.  Pricilla Riffle RN [facility]

## 2022-07-20 NOTE — Assessment & Plan Note (Addendum)
Continue to monitor

## 2022-07-20 NOTE — Care Management Important Message (Signed)
Important Message  Patient Details  Name: Danielle [name] MRN: [id number] Date of Birth: Apr 25, 1956   Medicare Important Message Given:  Yes     [name] 07/20/2022, 1:40 PM

## 2022-07-20 NOTE — Assessment & Plan Note (Deleted)
Midodrine 10 mg 4 times a day.

## 2022-07-20 NOTE — TOC Initial Note (Signed)
Transition of Care [facility]) - Initial/Assessment Note    Patient Details  Name: Danielle [name] MRN: [id number] Date of Birth: [birth year]  Transition [facility]) CM/SW Contact:    Alberteen Sam, LCSW Phone Number: 07/20/2022, 10:31 AM  Clinical Narrative:                  Readmission Risk Assessment completed per chart review.  Baseline she is on 4L of O2 at home via [employer], Thedore Mins with [employer] aware patient is interested in portable concentrators in which they followed up on 9/7.   Patient uses [employer] and [name] as PCP. Patient has a walker at home and no dme needs.    Family to transport at dc. Uses bus transportation typically to get to and from apts.    TOC will continue to follow for needs.      Expected Discharge Plan: Home/Self Care Barriers to Discharge: Continued Medical Work up   Patient Goals and CMS Choice Patient states their goals for this hospitalization and ongoing recovery are:: to go home CMS Medicare.gov Compare Post Acute Care list provided to:: Patient Choice offered to / list presented to : Patient  Expected Discharge Plan and Services Expected Discharge Plan: Home/Self Care       Living arrangements for the past 2 months: Single Family Home                                      Prior Living Arrangements/Services Living arrangements for the past 2 months: Single Family Home Lives with:: Self                   Activities of Daily Living Home Assistive Devices/Equipment: Environmental consultant (specify type), Eyeglasses ADL Screening (condition at time of admission) Patient's cognitive ability adequate to safely complete daily activities?: Yes Is the patient deaf or have difficulty hearing?: No Does the patient have difficulty seeing, even when wearing glasses/contacts?: No Does the patient have difficulty concentrating, remembering, or making decisions?: No Patient able to express need for assistance with ADLs?: Yes Does the  patient have difficulty dressing or bathing?: No Independently performs ADLs?: No Communication: Independent Dressing (OT): Independent Grooming: Independent Feeding: Independent Bathing: Needs assistance Is this a change from baseline?: Pre-admission baseline Toileting: Needs assistance Is this a change from baseline?: Pre-admission baseline In/Out Bed: Needs assistance Is this a change from baseline?: Pre-admission baseline Walks in Home: Needs assistance Is this a change from baseline?: Pre-admission baseline Does the patient have difficulty walking or climbing stairs?: Yes Weakness of Legs: Both Weakness of Arms/Hands: None  Permission Sought/Granted                  Emotional Assessment              Admission diagnosis:  Pleural effusion [J90] Status post thoracentesis [Z98.890] CHF exacerbation (HCC) [I50.9] Pleural effusion on right [J90] Acute on chronic combined systolic and diastolic CHF (congestive heart failure) (HCC) [I50.43] Acute on chronic respiratory failure with hypoxia ([place]) [J96.21] Patient Active Problem List   Diagnosis Date Noted   Prolonged QT interval 07/19/2022   Streptococcal pneumonia ([place]) 06/20/2022   Dyspnea 05/23/2022   SOB (shortness of breath) 05/22/2022   Chronic respiratory failure with hypoxia ([place])    Recurrent right pleural effusion 05/08/2022   Paroxysmal atrial fibrillation ([place]) 03/10/2022   Dyslipidemia 03/10/2022   Essential hypertension 03/10/2022  COPD, severe ([place]) 03/10/2022   Atrial fibrillation with RVR ([place]) 02/22/2022   TIA (transient ischemic attack) 01/29/2022   Epistaxis 01/27/2022   C. difficile colitis 11/27/2021   Abdominal pain 11/26/2021   HLD (hyperlipidemia) 11/26/2021   Iron deficiency anemia 11/26/2021   Aspiration pneumonia of right lower lobe due to gastric secretions (HCC)    Ileus following gastrointestinal surgery ([place])    Hypomagnesemia    Anemia of chronic disease     Hyperphosphatemia 10/24/2021   Hypotension 11/17/2020   Ileostomy status ([place]) 11/17/2020   Chronic obstructive pulmonary disease ([place]) 08/01/2020   CKD (chronic kidney disease), stage IV ([place]) 01/20/2020   Hypokalemia 01/20/2020   Elevated troponin 01/20/2020   Sepsis ([place]) 01/20/2020   Acute on chronic combined systolic and diastolic CHF (congestive heart failure) ([place]) 01/20/2020   Pleural effusion due to CHF (congestive heart failure) ([place]) 11/06/2019   Diarrhea    GERD (gastroesophageal reflux disease) 10/29/2019   Depression 10/29/2019   Acute blood loss anemia 06/20/2019   Normocytic anemia 06/20/2019   Anemia of chronic kidney failure, stage 4 (severe) ([place]) 06/12/2019   Malignant neoplasm of lower third of esophagus ([place]) 05/01/2019   Acute on chronic respiratory failure with hypoxia and hypercapnia ([place]) 12/22/2018   Ileostomy dysfunction ([place]) 09/28/2018   Reflux esophagitis    Iron deficiency anemia secondary to blood loss (chronic)    SBO (small bowel obstruction) ([place]) 06/29/2018   Atrial flutter ([place]) 06/09/2018   Lymphedema 06/09/2018   Acute kidney injury superimposed on CKD ([place]) 06/04/2018   Severe protein-calorie malnutrition ([place]) 06/04/2018   Focal (segmental) acute (reversible) ischemia of large intestine (HCC)    Ulceration of intestine    Diverticulosis of large intestine without diverticulitis    Colitis    Malnutrition of moderate degree 04/23/2018   Palliative care by specialist    DNR (do not resuscitate) discussion    Weakness    Acute on chronic respiratory failure with hypoxia ([place]) 04/21/2018   Hyponatremia 02/10/2017   Malignant neoplasm of upper lobe of right lung 07/30/2016   Lung nodule, solitary 07/25/2016   Malignant neoplasm of upper lobe of right lung ([place]) 07/25/2016   Carcinoma of overlapping sites of left breast in female, estrogen receptor positive ([place]) 06/22/2016   Multiple lung nodules 06/22/2016   Lesion of right lung     Breast cancer in female [facility]) 02/21/2016   Moderate COPD (chronic obstructive pulmonary disease) ([place]) 08/24/2014   PCP:  [name], [facility]:   [place], [address], [address] [place] [address] Phone: [phone] Fax: [place], [place] [address] [place] [address] Phone: [phone] Fax: [phone]     Social Determinants of Health (SDOH) Interventions    Readmission Risk Interventions    05/09/2022    3:11 PM 01/29/2022   11:36 AM 11/29/2021   10:49 AM  Readmission Risk Prevention Plan  Transportation Screening Complete Complete Complete  Medication Review (RN Care Manager) Complete Complete Complete  PCP or Specialist appointment within 3-5 days of discharge  Complete Complete  HRI or Home Care Consult Patient refused  Complete  SW Recovery Care/Counseling Consult Complete Complete Complete  Palliative Care Screening Not Applicable Not Applicable Not [place] Not Applicable Not Applicable Not Applicable

## 2022-07-20 NOTE — Progress Notes (Addendum)
Progress Note   Patient: Danielle [name] NIO:[id number] DOB: [birth year] DOA: 07/24/2022     2 DOS: the patient was seen and examined on 07/20/2022   Brief hospital course: KRISTA GODSIL is a 66 y.o. 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    ED course and evaluation --- BP 135/99 with RR 29 with O2 sat 90% on increased O2 flow rate of 5 L/min.  Labs with troponin 15>>17, BNP 2935.  Treated with IV Lasix in the ED.  Chest x-ray showed -- Small right pleural effusion, increased since prior study.  Increasing right basilar consolidation, which may reflect airspace disease or progressive atelectasis.  Mass-like linear consolidation right suprahilar region, unchanged since recent CT.  Patient was admitted to hospitalist service and IV diuresis was continued.    IR was consulted for thoracentesis which was done on 10/5 with 900 cc fluid removed.    Assessment and Plan: * Acute on chronic combined systolic and diastolic CHF (congestive heart failure) (HCC) Echo 01/30/22 -- EF 35-40% with regional WMA's, grade II diastolic dysfunction, moderate MR, mod-severe TR, moderate AS. Presented with recurrent pleural effusion and progressively worsening dyspnea. --IR for thoracentesis --Stop IV Lasix given Cr rising --Strict I/O's, daily weights --Monitor renal function, electrolytes   Hypotension History of same  intermittently and limiting GDMT, and in setting of diuresis.   No fever or leukocytosis, does have abdominal pain. --Reduced metoprolol dose --Hold off hydralazine --Hold diuretics --Maintain MAP>65 --Low threshold to start midodrine  --Monitor BP closely --Evaluation of abdominal pain as outlined  Acute on chronic respiratory failure with hypoxia (HCC) Baseline on 4 L/min continuous O2. O2 sats on admission were only 90% on 5 l/min in setting of decompensated CHF with recurrent pleural effusion. 10/6: on baseline 4 L O2 today, ongoing DOE --Supplement O2 to keep sats 88-94% --Mgmt of underlying issues as outlined   Recurrent right pleural effusion Presented with shortness of breath, dyspnea on exertion with chest discomfort, desaturating to the low 80s on her baseline flow rate.  Chest xray with increasing right pleural effusion from prior, BNP over 2000.   Chart reviewed: prior thoracenteses done this year on 1/30, 2/8, 4/16, 5/12, 7/25, 8/8, 9/5 --IR was consulted on admission. --Underwent thoracentesis on 10/5 with 900 cc fluid removed. --On diuresis as outlined --Monitor respiratory status  Paroxysmal atrial fibrillation (HCC) Continue amiodarone and Elqius.  Reduce metoprolol 50 >> 12.5 mg BID due to lower BP's  Acute kidney injury superimposed on CKD (HCC) Baseline CKD stage 4, cr was stable on admission 1.84, but increased to 2.38 after IV diuresis, further up to 2.77 today. --Hold diuresis --Nephrology consulted --Monitor BMP  --Avoid nephrotoxins and renally dose meds  Essential hypertension Now off IV Lasix  Stop hydralazine for now (BP's soft) Hold torsemide (BP's soft, AKI)  CKD (chronic kidney disease), stage IV ([place]) Now with AKI likely due to diuresis.  See AKI for plan.  Prolonged QT interval QTc was 592 on EKG on admission.   Improved -  repeat EKG 10/6 QTc 450 ms. --Serial EKG's to monitor --Keep K>4, Mg>2 --Telemetry monitoring  Malignant  neoplasm of upper lobe of right lung Malignant neoplasm of lower third of esophagus Patient is status post radiation therapy for esophageal cancer S/p radiation therapy for lung cancer Follow-up with oncology  HLD (hyperlipidemia) On Lipitor  Abdominal pain Pt reported new onset RLQ abdominal pain today (10/6), radiating across abdomen to her left side.  It got a bit better after a BM, but persistent and severe at times. --Abdominal xray --CT abd/pelvis if xray inconclusive --Pain control PRN --Monitor closely  Anemia of chronic disease Hemoglobin at baseline at 9.7  Chronic obstructive pulmonary disease (HCC) Severe COPD with chronic respiratory failure on 4 L of continuous O2 at home. --Continue scheduled and PRN bronchodilators --Continue inhaled steroids  Depression Continue Celexa 20 mg daily  Carcinoma of overlapping sites of left breast in female, estrogen receptor positive ([place]) Noted. No acute issues.        Subjective: Patient seen awake sitting up in bed.  She has ongoing RLQ pain that travels across her abdomen to her left side.  No nausea/vomiting.  Ongoing exertional dyspnea.  Reports abdominal let up some after a BM, but still comes on severe at times.  Physical Exam: Vitals:   07/20/22 0400 07/20/22 0830 07/20/22 1132 07/20/22 1539  BP: 100/67 96/72 95/76  (!) 83/55  Pulse: 85 69 70 79  Resp: 18 18 18    Temp: 97.9 F (36.6 C) (!) 97.5 F (36.4 C) (!) 97.5 F (36.4 C) (!) 97.5 F (36.4 C)  TempSrc: Oral     SpO2: 100% 99% 97% 99%  Weight:       General exam: awake, alert, no acute distress HEENT: moist mucus membranes, hearing grossly normal  Respiratory system: CTAB with diminished bases, no wheezes, normal respiratory effort at rest on 5 L/min [place] O2. Cardiovascular system: normal T9/Q3,  RRR, systolic murmur noted, no peripheral edema Gastrointestinal system: abdomen is soft, mildly tender from RLQ across mid abdomen, no guarding (wincing), no  rebound, positive bowel wounds Central nervous system: A&O x3. no gross focal neurologic deficits, normal speech Extremities: moves all, no edema, normal tone Psychiatry: normal mood, congruent affect, judgement and insight appear normal   Data Reviewed:  Notable labs: Creatinine 2.38>>2.77 , BUN 50, Mg 1.7, K 3.8, WBC 3.6, Hbg 9.5, EKG with Qtc improved to 450 ms.  Abdominal xray unremarkable.  Family Communication: None at bedside, will attempt to call  Disposition: Status is: Inpatient Remains inpatient appropriate because: Severity of illness with ongoing oxygen requirement above baseline, worsening renal function requiring close monitoring given patient is likely still volume overloaded. Hypotension.  New abdominal pain being evaluated.    Planned Discharge Destination: Home    Time spent: 45 minutes  Author: Ezekiel Slocumb, DO 07/20/2022 5:40 PM  For on call review [email or web address].

## 2022-07-20 NOTE — Progress Notes (Signed)
[facility]  ROUNDING NOTE   Subjective:   Danielle [name] 66 year old female with past medical conditions including diabetes, hypertension, lymphedema, breast cancer COPD and chronic kidney disease.  Patient presents to the emergency department of shortness of breath and has been admitted for Pleural effusion [J90] Status post thoracentesis [Z98.890] CHF exacerbation (HCC) [I50.9] Pleural effusion on right [J90] Acute on chronic combined systolic and diastolic CHF (congestive heart failure) (HCC) [I50.43] Acute on chronic respiratory failure with hypoxia ([place]) [J96.21]  Patient is known to our practice and is followed outpatient by Dr. Holley Raring.  She was last seen in office on April 19, 2022.  She states she has had progressive shortness of breath over the past 2 to 3 days, worse than baseline.  Patient currently wears 4 L home oxygen at baseline.  Denies nausea or vomiting.  States she initially had chest discomfort but not currently.  Current complaints include lower abdominal pain and urinary retention.  Patient states her last BM was yesterday but is having difficulty urinating.  She did report burning with urination about 3 weeks ago which resolved on its own in a way   Labs on ED arrival include BUN 45, creatinine 2.38 with GFR 22, decreased WBC 3.6 with hemoglobin 9.5.  Negative respiratory panel.  Chest x-ray shows increased small right pleural effusion with right basilar consolidation.  We have been consulted to monitor acute kidney injury.   Objective:  Vital signs in last 24 hours:  Temp:  [97.5 F (36.4 C)-97.9 F (36.6 C)] 97.5 F (36.4 C) (10/06 1132) Pulse Rate:  [41-92] 70 (10/06 1132) Resp:  [15-20] 18 (10/06 1132) BP: (86-112)/(57-79) 95/76 (10/06 1132) SpO2:  [93 %-100 %] 97 % (10/06 1132)  Weight change:  Filed Weights   07/19/22 0600  Weight: 50.3 kg    Intake/Output: No intake/output data recorded.   Intake/Output this shift:  No intake/output  data recorded.  Physical Exam: General: NAD, resting comfortably  Head: Normocephalic, atraumatic. Moist oral mucosal membranes  Eyes: Anicteric  Lungs:  Clear to auscultation, normal effort, [place] O2  Heart: Regular rate and rhythm  Abdomen:  Soft, tender, nondistended  Extremities: No peripheral edema.  Neurologic: Nonfocal, moving all four extremities  Skin: No lesions  Access: None    Basic Metabolic Panel: Recent Labs  Lab 07/17/2022 2205 07/20/22 0418  NA 138 141  K 4.5 3.8  CL 105 105  CO2 25 25  GLUCOSE 177* 84  BUN 45* 50*  CREATININE 2.38* 2.77*  CALCIUM 8.8* 8.9  MG  --  1.7    Liver Function Tests: Recent Labs  Lab 08/07/2022 2205  AST 24  ALT 18  ALKPHOS 55  BILITOT 0.8  PROT 6.5  ALBUMIN 3.5   No results for input(s): "LIPASE", "AMYLASE" in the last 168 hours. No results for input(s): "AMMONIA" in the last 168 hours.  CBC: Recent Labs  Lab 07/30/2022 1810 07/20/22 0418  WBC 3.9* 3.6*  NEUTROABS 3.1  --   HGB 9.7* 9.5*  HCT 29.9* 28.7*  MCV 96.8 94.1  PLT 189 195    Cardiac Enzymes: No results for input(s): "CKTOTAL", "CKMB", "CKMBINDEX", "TROPONINI" in the last 168 hours.  BNP: Invalid input(s): "POCBNP"  CBG: No results for input(s): "GLUCAP" in the last 168 hours.  Microbiology: Results for orders placed or performed during the hospital encounter of 08/08/2022  Resp Panel by RT-PCR (Flu A&B, Covid) Anterior Nasal Swab     Status: None   Collection Time:  07/31/2022  6:43 PM   Specimen: Anterior Nasal Swab  Result Value Ref Range Status   SARS Coronavirus 2 by RT PCR NEGATIVE NEGATIVE Final    Comment: (NOTE) SARS-CoV-2 target nucleic acids are NOT DETECTED.  The SARS-CoV-2 RNA is generally detectable in upper respiratory specimens during the acute phase of infection. The lowest concentration of SARS-CoV-2 viral copies this assay can detect is 138 copies/mL. A negative result does not preclude SARS-Cov-2 infection and should not be  used as the sole basis for treatment or other patient management decisions. A negative result may occur with  improper specimen collection/handling, submission of specimen other than nasopharyngeal swab, presence of viral mutation(s) within the areas targeted by this assay, and inadequate number of viral copies(<138 copies/mL). A negative result must be combined with clinical observations, patient history, and epidemiological information. The expected result is Negative.  Fact Sheet for Patients:  EntrepreneurPulse.com.au  Fact Sheet for Healthcare Providers:  IncredibleEmployment.be  This test is no t yet approved or cleared by the [place] FDA and  has been authorized for detection and/or diagnosis of SARS-CoV-2 by FDA under an Emergency Use Authorization (EUA). This EUA will remain  in effect (meaning this test can be used) for the duration of the COVID-19 declaration under Section 564(b)(1) of the Act, 21 U.S.C.section 360bbb-3(b)(1), unless the authorization is terminated  or revoked sooner.       Influenza A by PCR NEGATIVE NEGATIVE Final   Influenza B by PCR NEGATIVE NEGATIVE Final    Comment: (NOTE) The Xpert Xpress SARS-CoV-2/FLU/RSV plus assay is intended as an aid in the diagnosis of influenza from Nasopharyngeal swab specimens and should not be used as a sole basis for treatment. Nasal washings and aspirates are unacceptable for Xpert Xpress SARS-CoV-2/FLU/RSV testing.  Fact Sheet for Patients: EntrepreneurPulse.com.au  Fact Sheet for Healthcare Providers: IncredibleEmployment.be  This test is not yet approved or cleared by the [place] FDA and has been authorized for detection and/or diagnosis of SARS-CoV-2 by FDA under an Emergency Use Authorization (EUA). This EUA will remain in effect (meaning this test can be used) for the duration of the COVID-19 declaration under Section  564(b)(1) of the Act, 21 U.S.C. section 360bbb-3(b)(1), unless the authorization is terminated or revoked.  Performed at [facility], [address] [place], [place] [address]     Coagulation Studies: No results for input(s): "LABPROT", "INR" in the last 72 hours.  Urinalysis: No results for input(s): "COLORURINE", "LABSPEC", "PHURINE", "GLUCOSEU", "HGBUR", "BILIRUBINUR", "KETONESUR", "PROTEINUR", "UROBILINOGEN", "NITRITE", "LEUKOCYTESUR" in the last 72 hours.  Invalid input(s): "APPERANCEUR"    Imaging: US THORACENTESIS ASP PLEURAL SPACE W/IMG GUIDE  Result Date: 07/19/2022 INDICATION: Patient with history of chronic kidney disease, atrial fibrillation, congestive heart failure and right sided lung cancer status post radiation with recurrent right-sided pleural effusion. EXAM: ULTRASOUND GUIDED RIGHT THORACENTESIS MEDICATIONS: Local 1% lidocaine only. COMPLICATIONS: None immediate. PROCEDURE: An ultrasound guided thoracentesis was thoroughly discussed with the patient and questions answered. The benefits, risks, alternatives and complications were also discussed. The patient understands and wishes to proceed with the procedure. Written consent was obtained. Ultrasound was performed to localize and mark an adequate pocket of fluid in the right chest. The area was then prepped and draped in the normal sterile fashion. 1% Lidocaine was used for local anesthesia. Under ultrasound guidance a 19 gauge, 7-cm, Yueh catheter was introduced. Thoracentesis was performed. The catheter was removed and a dressing applied. FINDINGS: A total of approximately 900 mL of amber  colored fluid was removed. IMPRESSION: Successful ultrasound guided right thoracentesis yielding 900 mL of pleural fluid. This exam was performed by Tsosie Billing PA-C, and was supervised and interpreted by Dr. Earleen Newport. Electronically Signed   By: Corrie Mckusick D.O.   On: 07/19/2022 11:43   DG Chest Port 1 View  Result Date:  07/19/2022 CLINICAL DATA:  Post thoracentesis. EXAM: PORTABLE CHEST 1 VIEW COMPARISON:  Radiographs 07/26/2022 and 06/19/2022.  CT 06/16/2022. FINDINGS: 1121 hours. The right pleural effusion appears decreased in volume. There is improved aeration of the right lung base. No evidence of pneumothorax. The heart size and mediastinal contours are stable. The right suprahilar lesion appears grossly stable. No edema or new airspace disease identified. The bones appear unchanged. Telemetry leads overlie the chest. IMPRESSION: Decreased right pleural effusion and improved aeration of the right lung base following thoracentesis. No evidence of pneumothorax. Electronically Signed   By: Richardean Sale M.D.   On: 07/19/2022 11:39   DG Chest Port 1 View  Result Date: 07/20/2022 CLINICAL DATA:  Short of breath, left breast cancer EXAM: PORTABLE CHEST 1 VIEW COMPARISON:  06/19/2022, 06/16/2022 FINDINGS: Single frontal view of the chest demonstrates stable enlargement of the cardiac silhouette. There is a small right pleural effusion, increased since prior study. Increased consolidation at the right lung base may reflect atelectasis or airspace disease. Stable linear consolidation right suprahilar region. No pneumothorax. No acute bony abnormality. IMPRESSION: 1. Small right pleural effusion, increased since prior study. 2. Increasing right basilar consolidation, which may reflect airspace disease or progressive atelectasis. 3. Masslike linear consolidation right suprahilar region, unchanged since recent CT. Electronically Signed   By: Randa Ngo M.D.   On: 07/29/2022 18:32     Medications:     amiodarone  200 mg Oral Daily   apixaban  2.5 mg Oral BID   atorvastatin  40 mg Oral Daily   citalopram  20 mg Oral Daily   fluticasone furoate-vilanterol  1 puff Inhalation Daily   And   umeclidinium bromide  1 puff Inhalation Daily   hydrALAZINE  10 mg Oral TID   isosorbide dinitrate  5 mg Oral TID   metoprolol  tartrate  50 mg Oral BID   pantoprazole  40 mg Oral BID AC   polyethylene glycol  17 g Oral Daily   sodium bicarbonate  1,300 mg Oral Daily   acetaminophen **OR** acetaminophen, albuterol, calcium carbonate, HYDROcodone-acetaminophen, simethicone  Assessment/ Plan:  Danielle [name] is a 66 y.o.  female with past medical conditions including diabetes, hypertension, lymphedema, breast cancer COPD and chronic kidney disease.  Patient presents to the emergency department of shortness of breath and has been admitted for Pleural effusion [J90] Status post thoracentesis [Z98.890] CHF exacerbation ([place]) [I50.9] Pleural effusion on right [J90] Acute on chronic combined systolic and diastolic CHF (congestive heart failure) (HCC) [I50.43] Acute on chronic respiratory failure with hypoxia (HCC) [J96.21]   Acute Kidney Injury on chronic kidney disease stage IIIb with baseline creatinine 1.50 and GFR of 38 on 06/16/22.  Acute kidney injury secondary to cardiorenal syndrome and diuretic therapy Chronic kidney disease is secondary to hypertension and CHF.  No IV contrast exposure.  Agree with holding diuresis at this time.  Continue to avoid nephrotoxic agents and therapies.  No acute need for dialysis.  We will monitor renal function with current measures.  Lab Results  Component Value Date   CREATININE 2.77 (H) 07/20/2022   CREATININE 2.38 (H) 07/16/2022   CREATININE 1.84 (H)  07/05/2022   No intake or output data in the 24 hours ending 07/20/22 1431  2. Anemia of chronic kidney disease Lab Results  Component Value Date   HGB 9.5 (L) 07/20/2022    Hemoglobin within desired target.  We will continue to monitor.  3.  Hypertension with chronic kidney disease.  Home regimen includes torsemide, amiodarone, hydralazine, and isosorbide.  Torsemide currently held in setting of kidney injury.  4.  Pleural effusions, recurrent.  Seen on chest x-ray on admission.  Radiology successfully performed a  right thoracentesis with 900 mL removed.   LOS: 2   10/6/20232:31 PM

## 2022-07-21 ENCOUNTER — Inpatient Hospital Stay: Payer: Medicare Other

## 2022-07-21 DIAGNOSIS — R1310 Dysphagia, unspecified: Secondary | ICD-10-CM

## 2022-07-21 DIAGNOSIS — K224 Dyskinesia of esophagus: Secondary | ICD-10-CM | POA: Diagnosis present

## 2022-07-21 DIAGNOSIS — I5043 Acute on chronic combined systolic (congestive) and diastolic (congestive) heart failure: Secondary | ICD-10-CM | POA: Diagnosis not present

## 2022-07-21 LAB — BASIC METABOLIC PANEL
Anion gap: 6 (ref 5–15)
BUN: 47 mg/dL — ABNORMAL HIGH (ref 8–23)
CO2: 26 mmol/L (ref 22–32)
Calcium: 8.6 mg/dL — ABNORMAL LOW (ref 8.9–10.3)
Chloride: 104 mmol/L (ref 98–111)
Creatinine, Ser: 2.53 mg/dL — ABNORMAL HIGH (ref 0.44–1.00)
GFR, Estimated: 20 mL/min — ABNORMAL LOW (ref >60)
Glucose, Bld: 82 mg/dL (ref 70–99)
Potassium: 3.7 mmol/L (ref 3.5–5.1)
Sodium: 136 mmol/L (ref 135–145)

## 2022-07-21 LAB — CBC
HCT: 28.3 % — ABNORMAL LOW (ref 36.0–46.0)
Hemoglobin: 9.2 g/dL — ABNORMAL LOW (ref 12.0–15.0)
MCH: 30.6 pg (ref 26.0–34.0)
MCHC: 32.5 g/dL (ref 30.0–36.0)
MCV: 94 fL (ref 80.0–100.0)
Platelets: 177 10*3/uL (ref 150–400)
RBC: 3.01 MIL/uL — ABNORMAL LOW (ref 3.87–5.11)
RDW: 15.9 % — ABNORMAL HIGH (ref 11.5–15.5)
WBC: 3.7 10*3/uL — ABNORMAL LOW (ref 4.0–10.5)
nRBC: 1.9 % — ABNORMAL HIGH (ref 0.0–0.2)

## 2022-07-21 LAB — MAGNESIUM: Magnesium: 1.6 mg/dL — ABNORMAL LOW (ref 1.7–2.4)

## 2022-07-21 MED ORDER — MIDODRINE HCL 5 MG PO TABS
2.5000 mg | ORAL_TABLET | Freq: Three times a day (TID) | ORAL | Status: DC
  Administered 2022-07-21 – 2022-07-22 (×2): 2.5 mg via ORAL
  Filled 2022-07-21 (×2): qty 1

## 2022-07-21 MED ORDER — SIMETHICONE 40 MG/0.6ML PO SUSP
40.0000 mg | Freq: Four times a day (QID) | ORAL | Status: DC | PRN
  Filled 2022-07-21 (×2): qty 0.6

## 2022-07-21 MED ORDER — SIMETHICONE 40 MG/0.6ML PO SUSP (UNIT DOSE)
40.0000 mg | Freq: Four times a day (QID) | ORAL | Status: DC | PRN
  Administered 2022-07-21 – 2022-08-14 (×7): 40 mg via ORAL
  Filled 2022-07-21 (×8): qty 0.6
  Filled 2022-07-21: qty 30
  Filled 2022-07-21 (×6): qty 0.6

## 2022-07-21 MED ORDER — POTASSIUM CHLORIDE CRYS ER 20 MEQ PO TBCR
40.0000 meq | EXTENDED_RELEASE_TABLET | Freq: Once | ORAL | Status: AC
  Administered 2022-07-21: 40 meq via ORAL
  Filled 2022-07-21: qty 2

## 2022-07-21 MED ORDER — MAGNESIUM SULFATE 2 GM/50ML IV SOLN
2.0000 g | Freq: Once | INTRAVENOUS | Status: AC
  Administered 2022-07-21: 2 g via INTRAVENOUS
  Filled 2022-07-21: qty 50

## 2022-07-21 MED ORDER — METOPROLOL TARTRATE 25 MG PO TABS: 25.0000 mg | ORAL_TABLET | Freq: Two times a day (BID) | ORAL | Status: DC | PRN

## 2022-07-21 NOTE — Progress Notes (Signed)
   07/21/22 1713  Vitals  Temp 98.3 F (36.8 C)  Temp Source Oral  BP (!) 81/64  MAP (mmHg) 72  BP Location Right Arm  BP Method Automatic  Patient Position (if appropriate) Lying  Pulse Rate (!) 103  Pulse Rate Source Monitor  Resp 18  MEWS COLOR  MEWS Score Color Yellow  Oxygen Therapy  SpO2 99 %  MEWS Score  MEWS Temp 0  MEWS Systolic 1  MEWS Pulse 1  MEWS RR 0  MEWS LOC 0  MEWS Score 2   Patient has a yellow mews. Scheduled Midodrine given. Provider notified. Agricultural consultant notified. VS checks increased. Will recheck vitals. No new orders at this time.

## 2022-07-21 NOTE — Assessment & Plan Note (Deleted)
Pt reports difficulty with food and especially pills getting stuck in her esophagus and come back up at times, take a long time to pass into her stomach, causes a lot of discomfort.  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 --Reviewed prior EGD results from May this year, no mention of esophageal findings. --Esophagram 10/7 showed severe esophageal dysmotility, no evidence of obstruction or stenosis --48 hour calorie count is underway --Dietitian and SLP consults --Pt may require feeding tube and says she would want it, if needed for adequate nutrition --Palliative care consulted

## 2022-07-21 NOTE — Progress Notes (Signed)
[facility]  ROUNDING NOTE   Subjective:   Danielle [name] 66 year old female with past medical conditions including diabetes, hypertension, lymphedema, breast cancer COPD and chronic kidney disease.  Patient presents to the emergency department of shortness of breath and has been admitted for Pleural effusion [J90] Status post thoracentesis [Z98.890] CHF exacerbation (HCC) [I50.9] Pleural effusion on right [J90] Acute on chronic combined systolic and diastolic CHF (congestive heart failure) (HCC) [I50.43] Acute on chronic respiratory failure with hypoxia ([place]) [J96.21]  Patient is known to our practice and is followed outpatient by Dr. Holley Raring.  She was last seen in office on April 19, 2022.    Patient seen sitting up in bed, eating breakfast Alert and oriented Complains of shortness of breath with exertion, but states it's her baseline. Tolerating meals Remains on 4L   Objective:  Vital signs in last 24 hours:  Temp:  [97.4 F (36.3 C)-97.9 F (36.6 C)] 97.4 F (36.3 C) (10/07 0844) Pulse Rate:  [70-98] 80 (10/07 0844) Resp:  [18-20] 18 (10/07 0844) BP: (83-104)/(54-82) 102/79 (10/07 0844) SpO2:  [97 %-100 %] 100 % (10/07 0844) Weight:  [55 kg] 55 kg (10/07 0353)  Weight change:  Filed Weights   07/19/22 0600 07/21/22 0353  Weight: 50.3 kg 55 kg    Intake/Output: I/O last 3 completed shifts: In: -  Out: 300 [Urine:300]   Intake/Output this shift:  No intake/output data recorded.  Physical Exam: General: NAD, resting comfortably  Head: Normocephalic, atraumatic. Moist oral mucosal membranes  Eyes: Anicteric  Lungs:  Clear to auscultation, normal effort,  O2  Heart: Irregular rate and rhythm  Abdomen:  Soft, tender, nondistended  Extremities: No peripheral edema.  Neurologic: Nonfocal, moving all four extremities  Skin: No lesions  Access: None    Basic Metabolic Panel: Recent Labs  Lab 08/02/2022 2205 07/20/22 0418 07/21/22 0632  NA 138  141 136  K 4.5 3.8 3.7  CL 105 105 104  CO2 25 25 26   GLUCOSE 177* 84 82  BUN 45* 50* 47*  CREATININE 2.38* 2.77* 2.53*  CALCIUM 8.8* 8.9 8.6*  MG  --  1.7 1.6*     Liver Function Tests: Recent Labs  Lab 07/21/2022 2205  AST 24  ALT 18  ALKPHOS 55  BILITOT 0.8  PROT 6.5  ALBUMIN 3.5    No results for input(s): "LIPASE", "AMYLASE" in the last 168 hours. No results for input(s): "AMMONIA" in the last 168 hours.  CBC: Recent Labs  Lab 07/16/2022 1810 07/20/22 0418 07/21/22 0632  WBC 3.9* 3.6* 3.7*  NEUTROABS 3.1  --   --   HGB 9.7* 9.5* 9.2*  HCT 29.9* 28.7* 28.3*  MCV 96.8 94.1 94.0  PLT 189 195 177     Cardiac Enzymes: No results for input(s): "CKTOTAL", "CKMB", "CKMBINDEX", "TROPONINI" in the last 168 hours.  BNP: Invalid input(s): "POCBNP"  CBG: No results for input(s): "GLUCAP" in the last 168 hours.  Microbiology: Results for orders placed or performed during the hospital encounter of 07/19/2022  Resp Panel by RT-PCR (Flu A&B, Covid) Anterior Nasal Swab     Status: None   Collection Time: 07/27/2022  6:43 PM   Specimen: Anterior Nasal Swab  Result Value Ref Range Status   SARS Coronavirus 2 by RT PCR NEGATIVE NEGATIVE Final    Comment: (NOTE) SARS-CoV-2 target nucleic acids are NOT DETECTED.  The SARS-CoV-2 RNA is generally detectable in upper respiratory specimens during the acute phase of infection. The lowest concentration of  SARS-CoV-2 viral copies this assay can detect is 138 copies/mL. A negative result does not preclude SARS-Cov-2 infection and should not be used as the sole basis for treatment or other patient management decisions. A negative result may occur with  improper specimen collection/handling, submission of specimen other than nasopharyngeal swab, presence of viral mutation(s) within the areas targeted by this assay, and inadequate number of viral copies(<138 copies/mL). A negative result must be combined with clinical observations,  patient history, and epidemiological information. The expected result is Negative.  Fact Sheet for Patients:  EntrepreneurPulse.com.au  Fact Sheet for Healthcare Providers:  IncredibleEmployment.be  This test is no t yet approved or cleared by the [place] FDA and  has been authorized for detection and/or diagnosis of SARS-CoV-2 by FDA under an Emergency Use Authorization (EUA). This EUA will remain  in effect (meaning this test can be used) for the duration of the COVID-19 declaration under Section 564(b)(1) of the Act, 21 U.S.C.section 360bbb-3(b)(1), unless the authorization is terminated  or revoked sooner.       Influenza A by PCR NEGATIVE NEGATIVE Final   Influenza B by PCR NEGATIVE NEGATIVE Final    Comment: (NOTE) The Xpert Xpress SARS-CoV-2/FLU/RSV plus assay is intended as an aid in the diagnosis of influenza from Nasopharyngeal swab specimens and should not be used as a sole basis for treatment. Nasal washings and aspirates are unacceptable for Xpert Xpress SARS-CoV-2/FLU/RSV testing.  Fact Sheet for Patients: EntrepreneurPulse.com.au  Fact Sheet for Healthcare Providers: IncredibleEmployment.be  This test is not yet approved or cleared by the [place] FDA and has been authorized for detection and/or diagnosis of SARS-CoV-2 by FDA under an Emergency Use Authorization (EUA). This EUA will remain in effect (meaning this test can be used) for the duration of the COVID-19 declaration under Section 564(b)(1) of the Act, 21 U.S.C. section 360bbb-3(b)(1), unless the authorization is terminated or revoked.  Performed at [facility], [address] [place],  [address]     Coagulation Studies: No results for input(s): "LABPROT", "INR" in the last 72 hours.  Urinalysis: No results for input(s): "COLORURINE", "LABSPEC", "PHURINE", "GLUCOSEU", "HGBUR", "BILIRUBINUR",  "KETONESUR", "PROTEINUR", "UROBILINOGEN", "NITRITE", "LEUKOCYTESUR" in the last 72 hours.  Invalid input(s): "APPERANCEUR"    Imaging: CT CHEST ABDOMEN PELVIS WO CONTRAST  Result Date: 07/20/2022 CLINICAL DATA:  Abdominal pain, RIGHT lower quadrant pain radiating to LEFT side, acute kidney injury, question sepsis. History CHF, lung cancer, breast cancer, atrial fibrillation, COPD, former smoker EXAM: CT CHEST, ABDOMEN AND PELVIS WITHOUT CONTRAST TECHNIQUE: Multidetector CT imaging of the chest, abdomen and pelvis was performed following the standard protocol without IV contrast. Patient drank dilute oral contrast for exam. RADIATION DOSE REDUCTION: This exam was performed according to the departmental dose-optimization program which includes automated exposure control, adjustment of the mA and/or kV according to patient size and/or use of iterative reconstruction technique. COMPARISON:  CT angio chest 06/16/2022, CT abdomen and pelvis 05/08/2022 FINDINGS: CT CHEST FINDINGS Cardiovascular: Extensive atherosclerotic calcifications aorta, proximal great vessels, and coronary arteries. Aorta normal caliber. Mild enlargement of cardiac chambers. No pericardial effusion. Mediastinum/Nodes: Esophagus unremarkable. Base of cervical region normal appearance. Prior LEFT mastectomy. No adenopathy. Lungs/Pleura: Small RIGHT pleural effusion. Emphysematous changes with chronic opacity RIGHT upper lobe with spiculated margins, 2.6 x 1.8 cm unchanged. Additional masslike area of opacity in the anterior RIGHT upper lobe extending to the pleural surface measures 4.1 x 1.9 cm, unchanged. Mild compressive atelectasis of RIGHT lower lobe. Subpleural scarring anterolateral LEFT upper  lobe suspect related to prior radiation therapy to mastectomy bed, unchanged. No infiltrate identified. Nodular density posteromedial LEFT lower lobe 9 x 6 mm previously 7 x 4 mm, cannot exclude metastasis. Minimal dependent atelectasis LEFT lower  lobe. Musculoskeletal: Osseous demineralization without focal bone lesion. CT ABDOMEN PELVIS FINDINGS Hepatobiliary: Question cyst anterior LEFT lobe liver 11 mm unchanged. Additional 6 mm nonspecific low-attenuation focus RIGHT lobe liver image 68 unchanged. Gallbladder and liver otherwise normal appearance. Pancreas: Normal appearance Spleen: Normal appearance Adrenals/Urinary Tract: Adrenal glands, kidneys, ureters, and bladder normal appearance Stomach/Bowel: Stomach unremarkable. Diverticulosis of descending and sigmoid colon without evidence of diverticulitis. Remaining bowel loops unremarkable. Appendix not visualized. Vascular/Lymphatic: Extensive atherosclerotic calcifications without aneurysm. Significant atherosclerotic calcifications at the origins of the superior mesenteric and celiac arteries. No adenopathy. Reproductive: Atrophic uterus and adnexa with question small exophytic leiomyoma from anterior uterine wall 18 mm diameter tubal ligation clips. Other: No free air or free fluid. No hernia or inflammatory process. Musculoskeletal: Osseous demineralization. Chronic mild superior endplate compression deformities of L2 and L4. IMPRESSION: Small RIGHT pleural effusion with compressive atelectasis of RIGHT lower lobe, slightly decreased. Stable masslike opacities in the RIGHT upper lobe question related to treated tumor. Slight increase in size of a LEFT lower lobe nodule 9 x 6 mm, cannot exclude metastasis. Distal colonic diverticulosis without evidence of diverticulitis. No acute intra-abdominal or intrapelvic abnormalities. Extensive atherosclerotic calcifications including coronary arteries. Aortic Atherosclerosis (ICD10-I70.0) and Emphysema (ICD10-J43.9). Electronically Signed   By: Lavonia Dana M.D.   On: 07/20/2022 19:05   DG Abd 1 View  Result Date: 07/20/2022 CLINICAL DATA:  Right lower quadrant pain EXAM: ABDOMEN - 1 VIEW COMPARISON:  06/20/2022 FINDINGS: The bowel gas pattern is normal. No  radio-opaque calculi or other significant radiographic abnormality are seen. Tubal ligation clips are noted. Aortoiliac atherosclerosis. IMPRESSION: Negative. Electronically Signed   By: Davina Poke D.O.   On: 07/20/2022 15:09   DG Chest Port 1 View  Result Date: 07/19/2022 CLINICAL DATA:  Post thoracentesis. EXAM: PORTABLE CHEST 1 VIEW COMPARISON:  Radiographs 07/17/2022 and 06/19/2022.  CT 06/16/2022. FINDINGS: 1121 hours. The right pleural effusion appears decreased in volume. There is improved aeration of the right lung base. No evidence of pneumothorax. The heart size and mediastinal contours are stable. The right suprahilar lesion appears grossly stable. No edema or new airspace disease identified. The bones appear unchanged. Telemetry leads overlie the chest. IMPRESSION: Decreased right pleural effusion and improved aeration of the right lung base following thoracentesis. No evidence of pneumothorax. Electronically Signed   By: Richardean Sale M.D.   On: 07/19/2022 11:39     Medications:     amiodarone  200 mg Oral Daily   apixaban  2.5 mg Oral BID   atorvastatin  40 mg Oral Daily   citalopram  20 mg Oral Daily   fluticasone furoate-vilanterol  1 puff Inhalation Daily   And   umeclidinium bromide  1 puff Inhalation Daily   midodrine  2.5 mg Oral TID WC   pantoprazole  40 mg Oral BID AC   polyethylene glycol  17 g Oral Daily   sodium bicarbonate  1,300 mg Oral Daily   sodium chloride flush  3 mL Intravenous Q12H   acetaminophen **OR** acetaminophen, albuterol, calcium carbonate, HYDROcodone-acetaminophen, simethicone  Assessment/ Plan:  Ms. KYIESHA MILLWARD is a 66 y.o.  female with past medical conditions including diabetes, hypertension, lymphedema, breast cancer COPD and chronic kidney disease.  Patient presents to the emergency department of  shortness of breath and has been admitted for Pleural effusion [J90] Status post thoracentesis [Z98.890] CHF exacerbation (HCC)  [I50.9] Pleural effusion on right [J90] Acute on chronic combined systolic and diastolic CHF (congestive heart failure) (HCC) [I50.43] Acute on chronic respiratory failure with hypoxia (HCC) [J96.21]   Acute Kidney Injury on chronic kidney disease stage IIIb with baseline creatinine 1.50 and GFR of 38 on 06/16/22.  Acute kidney injury secondary to cardiorenal syndrome and diuretic therapy Chronic kidney disease is secondary to hypertension and CHF.  No IV contrast exposure.    Creatinine improved slightly overnight. Patient feels she is back to her baseline. Would recommend Torsemide 40mg  every other day at discharge. She will need hospital follow up with Dr Holley Raring at discharge.   Lab Results  Component Value Date   CREATININE 2.53 (H) 07/21/2022   CREATININE 2.77 (H) 07/20/2022   CREATININE 2.38 (H) 08/01/2022    Intake/Output Summary (Last 24 hours) at 07/21/2022 1122 Last data filed at 07/21/2022 0649 Gross per 24 hour  Intake --  Output 300 ml  Net -300 ml    2. Anemia of chronic kidney disease Lab Results  Component Value Date   HGB 9.2 (L) 07/21/2022    Hemoglobin at goal.   3.  Hypertension with chronic kidney disease.  Home regimen includes torsemide, amiodarone, hydralazine, and isosorbide.  All currently held due to soft BP. Prescribe Midodrine 2.5mg  three times daily for BP support by primary team.    4.  Pleural effusions, recurrent.  Seen on chest x-ray on admission.  Radiology successfully performed a right thoracentesis with 900 mL removed.   LOS: 3   10/7/202311:22 AM

## 2022-07-21 NOTE — Evaluation (Signed)
Physical Therapy Evaluation Patient Details Name: Danielle [name] MRN: [id number] DOB: 1956-03-02 Today's Date: 07/21/2022  History of Present Illness  Danielle [name] is a 42yoF who comes to [facility] on 07/19/2022 c SOB. BNP : 2935, CXR showing Rt pleural effusion. Underwent thoracentesis with IR on 10/5 c   yield of 900 mL of amber colored fluid. While her ept has had some post diuersis hypotension and changes in kidney function. PMH of HTN, HLD, COPD on 2 L oxygen, GERD, depression, GAD, CHF, CKD-4, BrCA (S/P of the left mastectomy, radiation and chemotherapy), lung CA, esophageal cancer, AF on Eliquis, deficiency anemia, AS, lymphedema, SBO, right colectomy with ileostomy, recent reversal of ileostomy.  Clinical Impression  Pt in bed on entry, 4L/min flowrate which is baseline flowrate per pt. Pt has been mobilizing from bed to [facility] today on her own, still having easily provoked DOE. Pt denies any acute impairment of leg strength or balance today or PTA, reports significant limitations in her tolerance of AMB at home due to DOE, limiting her to room-to-room distances. She is typically modified independent with ADL performance, as she is today, but requires oxygen. Pt not agreeable to any trial of additional mobility at this time as she feels poorly- has been NPO all day with subsequent HA and now awaiting arrival of food. Pt demonstrating impairment of strength, balance, O2 perfusion, and activity tolerance, limiting indep in ADL and functional mobility within the home and community. Pt will benefit from skilled PT intervention to address the above deficits in order to restore patient to PLOF and improve safety for return to home.        Recommendations for follow up therapy are one component of a multi-disciplinary discharge planning process, led by the attending physician.  Recommendations may be updated based on patient status, additional functional criteria and insurance authorization.  Follow Up  Recommendations No PT follow up (pt not interested, feels only limited by breathing)      Assistance Recommended at Discharge Set up Supervision/Assistance  Patient can return home with the following  Assistance with cooking/housework;Assist for transportation;Help with stairs or ramp for entrance;A little help with bathing/dressing/bathroom    Equipment Recommendations None recommended by PT  Recommendations for Other Services       Functional Status Assessment Patient has had a recent decline in their functional status and demonstrates the ability to make significant improvements in function in a reasonable and predictable amount of time.     Precautions / Restrictions Precautions Precautions: Fall Restrictions Weight Bearing Restrictions: No      Mobility  Bed Mobility Overal bed mobility: Needs Assistance Bed Mobility: Supine to Sit, Sit to Supine     Supine to sit: Modified independent (Device/Increase time), HOB elevated Sit to supine: Modified independent (Device/Increase time), HOB elevated        Transfers Overall transfer level: Needs assistance Equipment used: None Transfers: Sit to/from Stand, Bed to chair/wheelchair/BSC Sit to Stand: Modified independent (Device/Increase time) Stand pivot transfers: Modified independent (Device/Increase time)         General transfer comment: has been AMB from bed ot [facility] modI throughout the day    Ambulation/Gait Ambulation/Gait assistance:  (not able to AMB farther than [facility] due to easily provoked DOE)                Financial trader Rankin (Stroke Patients Only)  Balance                                             Pertinent Vitals/Pain Pain Assessment Pain Assessment:  (HA reports do to not having eaten)    Home Living Family/patient expects to be discharged to:: Private residence Living Arrangements: Children (Son and  Water engineer) Available Help at Discharge: Family Type of Home: Mobile home Home Access: Stairs to enter Entrance Stairs-Rails: Right Entrance Stairs-Number of Steps: 4-5   Home Layout: One level Home Equipment: Frederica - single Barista (2 wheels);BSC/3in1;Wheelchair - manual Additional Comments: Has O2 tank for home use, but says it is difficult to transport upstairs into home. As a result, she sometimes goes without O2.    Prior Function Prior Level of Function : Independent/Modified Independent             Mobility Comments: reports she has been indep at baseline, occasionally uses RW when she feels SOB symptoms. ADLs Comments: reports she is able to perform ADLs with mod I     Hand Dominance   Dominant Hand: Right    Extremity/Trunk Assessment                Communication   Communication: No difficulties  Cognition Arousal/Alertness: Awake/alert Behavior During Therapy: WFL for tasks assessed/performed Overall Cognitive Status: Within Functional Limits for tasks assessed                                          General Comments      Exercises     Assessment/Plan    PT Assessment Patient needs continued PT services  PT Problem List Decreased mobility;Decreased activity tolerance       PT Treatment Interventions DME instruction;Gait training;Stair training;Functional mobility training;Therapeutic activities;Therapeutic exercise;Patient/family education    PT Goals (Current goals can be found in the Care Plan section)  Acute Rehab PT Goals Patient Stated Goal: improve breathing PT Goal Formulation: With patient Time For Goal Achievement: 08/04/22 Potential to Achieve Goals: Good    Frequency Min 2X/week     Co-evaluation               AM-PAC PT "6 Clicks" Mobility  Outcome Measure Help needed turning from your back to your side while in a flat bed without using bedrails?: None Help needed moving from lying on your  back to sitting on the side of a flat bed without using bedrails?: None Help needed moving to and from a bed to a chair (including a wheelchair)?: None Help needed standing up from a chair using your arms (e.g., wheelchair or bedside chair)?: None Help needed to walk in hospital room?: A Lot Help needed climbing 3-5 steps with a railing? : A Lot 6 Click Score: 20    End of Session Equipment Utilized During Treatment: Oxygen Activity Tolerance: Patient limited by pain;Patient limited by fatigue Patient left: in bed;with call bell/phone within reach   PT Visit Diagnosis: Difficulty in walking, not elsewhere classified (R26.2);Dizziness and giddiness (R42)    Time: 1555-1610 PT Time Calculation (min) (ACUTE ONLY): 15 min   Charges:   PT Evaluation $PT Eval Low Complexity: 1 Low         4:25 PM, 07/21/22 Etta Grandchild, PT, DPT Physical Therapist -  [facility]  [phone] ([place])    [place] 07/21/2022, 4:21 PM

## 2022-07-21 NOTE — Assessment & Plan Note (Addendum)
Comfort care measures

## 2022-07-21 NOTE — Progress Notes (Signed)
Progress Note   Patient: Danielle [name] YWV:[id number] DOB: 04-May-1956 DOA: 07/25/2022     3 DOS: the patient was seen and examined on 07/21/2022   Brief hospital course: Danielle [name] is a 66 y.o. female with medical history significant for Paroxysmal A-fib on chronic anticoagulation therapy, history of chronic systolic heart failure, history of adenocarcinoma of the right lung status post radiation therapy, history of stage IIIb chronic kidney disease, aortic valve stenosis, COPD with chronic respiratory failure on 4 L of oxygen continuous, history of left breast cancer status post mastectomy, history of esophageal cancer recurrent right-sided pleural effusion requiring thoracentesis, most recently on 06/19/2022 who presented to the ER on 08/01/2022 for evaluation of worsening shortness of breath with associated chest tightness.    ED course and evaluation --- BP 135/99 with RR 29 with O2 sat 90% on increased O2 flow rate of 5 L/min.  Labs with troponin 15>>17, BNP 2935.  Treated with IV Lasix in the ED.  Chest x-ray showed -- Small right pleural effusion, increased since prior study.  Increasing right basilar consolidation, which may reflect airspace disease or progressive atelectasis.  Mass-like linear consolidation right suprahilar region, unchanged since recent CT.  Patient was admitted to hospitalist service and IV diuresis was continued.    IR was consulted for thoracentesis which was done on 10/5 with 900 cc fluid removed.    Assessment and Plan: * Acute on chronic combined systolic and diastolic CHF (congestive heart failure) (HCC) Echo 01/30/22 -- EF 35-40% with regional WMA's, grade II diastolic dysfunction, moderate MR, mod-severe TR, moderate AS. Presented with recurrent pleural effusion and progressively worsening dyspnea. --IR for thoracentesis --Stop IV Lasix given Cr rising --Strict I/O's, daily weights --Monitor renal function, electrolytes   Hypotension History of same  intermittently and limiting GDMT, and in setting of diuresis.   No fever or leukocytosis, does have abdominal pain. --Reduced metoprolol dose --Hold off hydralazine, metoprolol, Isordil --Hold diuretics until renal function improves --Maintain MAP>65 --Start low dose midodrine  --Monitor BP closely   Acute on chronic respiratory failure with hypoxia (HCC) Baseline on 4 L/min continuous O2. O2 sats on admission were only 90% on 5 l/min in setting of decompensated CHF with recurrent pleural effusion. 10/6: on baseline 4 L O2 today, ongoing DOE --Supplement O2 to keep sats 88-94% --Mgmt of underlying issues as outlined   Recurrent right pleural effusion Presented with shortness of breath, dyspnea on exertion with chest discomfort, desaturating to the low 80s on her baseline flow rate.  Chest xray with increasing right pleural effusion from prior, BNP over 2000.   Chart reviewed: prior thoracenteses done this year on 1/30, 2/8, 4/16, 5/12, 7/25, 8/8, 9/5 --IR was consulted on admission. --Underwent thoracentesis on 10/5 with 900 cc fluid removed. --On diuresis as outlined --Monitor respiratory status  Paroxysmal atrial fibrillation (HCC) Continue amiodarone and Elqius.  Reduce metoprolol 50 >> 12.5 mg BID due to lower BP's  Acute kidney injury superimposed on CKD (HCC) Baseline CKD stage 4, cr was stable on admission 1.84, but increased to 2.38 after IV diuresis >> 2.77 >> 2.53 today. --Hold diuresis --Nephrology consulted --Monitor BMP  --Avoid nephrotoxins and renally dose meds  Essential hypertension Now off IV Lasix  Hold hydralazine, metprolol, Isordil for now (BP's soft even with these all being held) Hold torsemide (AKI), resume diuretics per nephrology. Started on low dose midodrine.  CKD (chronic kidney disease), stage IV ([place]) Now with AKI likely due to diuresis.  See AKI for  plan.  Difficulty swallowing Pt reports difficulty with food and especially pills getting  stuck in her esophagus and come back up at times, take a long time to pass into her stomach, causes a lot of discomfort.  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 --Esophagram pending to assess for narrowing --Reviewed prior EGD results from May this year, no mention of esophageal findings.  Prolonged QT interval QTc was 592 on EKG on admission.   Improved - repeat EKG 10/6 QTc 450 ms. --Serial EKG's to monitor --Keep K>4, Mg>2 --Telemetry monitoring  Malignant neoplasm of upper lobe of right lung Malignant neoplasm of lower third of esophagus Patient is status post radiation therapy for esophageal cancer S/p radiation therapy for lung cancer Follow-up with oncology  HLD (hyperlipidemia) On Lipitor  Abdominal pain Pt reported new onset RLQ abdominal pain today (10/6), radiating across abdomen to her left side.  It got a bit better after a BM, but persistent and severe at times. --Abdominal xray 10/6 negative --CT abd/pelvis if xray 10/6 also no acute findings to explain --Trial of simethicone --Pain control PRN --Monitor closely  Anemia of chronic disease Hemoglobin at baseline at 9.7  Hypomagnesemia Magnesium 1.6 this morning, replacing with IV.  Repeat tomorrow maintain Mg > 2.0 given history of QTc prolongation and A-fib  Chronic obstructive pulmonary disease (HCC) Severe COPD with chronic respiratory failure on 4 L of continuous O2 at home. --Continue scheduled and PRN bronchodilators --Continue inhaled steroids  Depression Continue Celexa 20 mg daily  Carcinoma of overlapping sites of left breast in female, estrogen receptor positive ([place]) Noted. No acute issues.        Subjective: Patient seen awake sitting up in bed.  Says her breathing is okay but she has significant dyspnea with exertion even getting up to the bedside commode.  She reports having difficulty with worsening swallow issues.  She states pills and sometimes food does get stuck and  do not pass, they come back out.  She continues to have abdominal pain that is more intermittent and feels gassy to her.   Physical Exam: Vitals:   07/21/22 0353 07/21/22 0357 07/21/22 0844 07/21/22 1218  BP:  101/76 102/79 96/71  Pulse:  86 80 86  Resp:  18 18 18   Temp:  97.7 F (36.5 C) (!) 97.4 F (36.3 C) 97.9 F (36.6 C)  TempSrc:      SpO2:  99% 100% 99%  Weight: 55 kg      General exam: awake, alert, no acute distress HEENT: moist mucus membranes, hearing grossly normal  Respiratory system: CTAB with diminished bases, no wheezes, normal respiratory effort at rest on 4 L/min Supreme O2. Cardiovascular system: normal D7/O2,  RRR, systolic murmur noted, no peripheral edema Gastrointestinal system: abdomen is soft, mild tenderness, no guarding or rebound Central nervous system: A&O x3. no gross focal neurologic deficits, normal speech Extremities: moves all, no edema, normal tone Psychiatry: normal mood, congruent affect, judgement and insight appear normal   Data Reviewed:  Notable labs: Creatinine 2.38>>2.77>> 2.53 , BUN 47, magnesium 1.6  Abdominal xray unremarkable.  Family Communication: None at bedside, will attempt to call  Disposition: Status is: Inpatient Remains inpatient appropriate because: Monitoring for further renal function improvement.  Ongoing evaluation of abdominal pain and difficulty swallowing.  Anticipate discharge in 24 to 48 hours    Planned Discharge Destination: Home    Time spent: 45 minutes  Author: Ezekiel Slocumb, DO 07/21/2022 2:52 PM  For  on call review [email or web address].

## 2022-07-21 NOTE — Progress Notes (Signed)
   07/21/22 1809  Vitals  Temp 98.1 F (36.7 C)  Temp Source Oral  BP 104/76  MAP (mmHg) 84  BP Location Right Arm  BP Method Automatic  Patient Position (if appropriate) Lying  Pulse Rate 84  Pulse Rate Source Dinamap  Level of Consciousness  Level of Consciousness Alert  MEWS COLOR  MEWS Score Color Green  Oxygen Therapy  SpO2 98 %  O2 Device Nasal Cannula  O2 Flow Rate (L/min) 4 L/min  MEWS Score  MEWS Temp 0  MEWS Systolic 0  MEWS Pulse 0  MEWS RR 0  MEWS LOC 0  MEWS Score 0   Mews now Green after midodrine dose.

## 2022-07-22 DIAGNOSIS — N189 Chronic kidney disease, unspecified: Secondary | ICD-10-CM

## 2022-07-22 DIAGNOSIS — J9621 Acute and chronic respiratory failure with hypoxia: Secondary | ICD-10-CM | POA: Diagnosis not present

## 2022-07-22 DIAGNOSIS — I5043 Acute on chronic combined systolic (congestive) and diastolic (congestive) heart failure: Secondary | ICD-10-CM | POA: Diagnosis not present

## 2022-07-22 DIAGNOSIS — K224 Dyskinesia of esophagus: Secondary | ICD-10-CM | POA: Diagnosis not present

## 2022-07-22 DIAGNOSIS — N179 Acute kidney failure, unspecified: Secondary | ICD-10-CM | POA: Diagnosis not present

## 2022-07-22 LAB — BASIC METABOLIC PANEL
Anion gap: 4 — ABNORMAL LOW (ref 5–15)
BUN: 48 mg/dL — ABNORMAL HIGH (ref 8–23)
CO2: 27 mmol/L (ref 22–32)
Calcium: 8.5 mg/dL — ABNORMAL LOW (ref 8.9–10.3)
Chloride: 109 mmol/L (ref 98–111)
Creatinine, Ser: 2.25 mg/dL — ABNORMAL HIGH (ref 0.44–1.00)
GFR, Estimated: 23 mL/min — ABNORMAL LOW (ref >60)
Glucose, Bld: 91 mg/dL (ref 70–99)
Potassium: 4.1 mmol/L (ref 3.5–5.1)
Sodium: 140 mmol/L (ref 135–145)

## 2022-07-22 LAB — MAGNESIUM: Magnesium: 2.1 mg/dL (ref 1.7–2.4)

## 2022-07-22 MED ORDER — METOPROLOL TARTRATE 25 MG PO TABS
25.0000 mg | ORAL_TABLET | Freq: Two times a day (BID) | ORAL | Status: DC
  Administered 2022-07-22 – 2022-07-23 (×3): 25 mg via ORAL
  Filled 2022-07-22 (×3): qty 1

## 2022-07-22 MED ORDER — TORSEMIDE 20 MG PO TABS
40.0000 mg | ORAL_TABLET | ORAL | Status: DC
  Administered 2022-07-23 – 2022-07-25 (×2): 40 mg via ORAL
  Filled 2022-07-22 (×2): qty 2

## 2022-07-22 MED ORDER — MIDODRINE HCL 5 MG PO TABS
5.0000 mg | ORAL_TABLET | Freq: Three times a day (TID) | ORAL | Status: DC
  Administered 2022-07-22 – 2022-07-27 (×15): 5 mg via ORAL
  Filled 2022-07-22 (×15): qty 1

## 2022-07-22 NOTE — Evaluation (Signed)
Occupational Therapy Evaluation Patient Details Name: Danielle [name] MRN: [id number] DOB: 12-21-1955 Today's Date: 07/22/2022   History of Present Illness Pt is a 66 year old female presented to the ER on 08/12/2022 for evaluation of worsening shortness of breath with associated chest tightness; PMH significant for Paroxysmal A-fib on chronic anticoagulation therapy, history of chronic systolic heart failure, history of adenocarcinoma of the right lung status post radiation therapy, history of stage IIIb chronic kidney disease, aortic valve stenosis, COPD with chronic respiratory failure on 4 L of oxygen continuous, history of left breast cancer status post mastectomy, history of esophageal cancer recurrent right-sided pleural effusion requiring thoracentesis   Clinical Impression   Chart reviewed, pt greeted in bed agreeable to OT evaluation. Pt is alert and oriented x4. Pt endorses increased time/effort required for ADL tasks at this time while she is typically MOD I in ADL completion, pt reports her grand daughter will assist her as needed. Pt presents with deficits in endurance and activity tolerance, affecting safe and optimal ADL completion. Pt reports increased difficulty climbing stairs at home, hopeful for a ramp to be built, waiting for deliver of a shower chair. Pt provided education re: use of AE for LB dressing along with breath support as pt reports SOB with lateral trunk flexion. Pt provided education re: role of OT/ home health therapy in potentially assisting with EC techniques and home set up for increased ease of ADL completion. Pt reports she does not feel she needs it at this time. OT will continue to follow acutely.      Recommendations for follow up therapy are one component of a multi-disciplinary discharge planning process, led by the attending physician.  Recommendations may be updated based on patient status, additional functional criteria and insurance authorization.    Follow Up Recommendations  Home health OT (pt reports she does not feel she needs home therapy)    Assistance Recommended at Discharge Intermittent Supervision/Assistance  Patient can return home with the following A little help with walking and/or transfers;A little help with bathing/dressing/bathroom    Functional Status Assessment  Patient has had a recent decline in their functional status and demonstrates the ability to make significant improvements in function in a reasonable and predictable amount of time.  Equipment Recommendations  Tub/shower seat    Recommendations for Other Services       Precautions / Restrictions Precautions Precautions: Fall Restrictions Weight Bearing Restrictions: No      Mobility Bed Mobility Overal bed mobility: Needs Assistance Bed Mobility: Supine to Sit     Supine to sit: Modified independent (Device/Increase time), HOB elevated          Transfers Overall transfer level: Needs assistance Equipment used: Rolling walker (2 wheels) Transfers: Sit to/from Stand Sit to Stand: Supervision                  Balance Overall balance assessment: Needs assistance Sitting-balance support: Feet supported Sitting balance-Leahy Scale: Good     Standing balance support: During functional activity, Bilateral upper extremity supported Standing balance-Leahy Scale: Good                             ADL either performed or assessed with clinical judgement   ADL Overall ADL's : Needs assistance/impaired     Grooming: Sitting;Set up               Lower Body Dressing: Moderate assistance Lower Body Dressing  Details (indicate cue type and reason): anticipated, at this time due to increase SOB with lateral trunk flexion Toilet Transfer: Supervision/safety;Rolling walker (2 wheels) Toilet Transfer Details (indicate cue type and reason): simulated Toileting- Clothing Manipulation and Hygiene: Modified  independent Toileting - Clothing Manipulation Details (indicate cue type and reason): pt reports she has been performing short amb tranfser to bsc, toileting with MOD I while in hospital     Functional mobility during ADLs: Supervision/safety;Rolling walker (2 wheels) (approx 15' in room)       Vision Baseline Vision/History: 1 Wears glasses Patient Visual Report: No change from baseline       Perception     Praxis      Pertinent Vitals/Pain Pain Assessment Pain Assessment: No/denies pain     Hand Dominance Right   Extremity/Trunk Assessment Upper Extremity Assessment Upper Extremity Assessment: Overall WFL for tasks assessed   Lower Extremity Assessment Lower Extremity Assessment: Generalized weakness       Communication Communication Communication: No difficulties   Cognition Arousal/Alertness: Awake/alert Behavior During Therapy: WFL for tasks assessed/performed Overall Cognitive Status: Within Functional Limits for tasks assessed                                       General Comments  pt spo2 88% on 4 L via [place] with HR up to 133 following functional mobility, recovered to >90% with HR in 90s after approx 2 minutes;    Exercises Other Exercises Other Exercises: educated pt re: role of OT, role of rehab, discharge recommendations, DME use, EC techniques, falls prevention   Shoulder Instructions      Home Living Family/patient expects to be discharged to:: Private residence Living Arrangements: Children (son and grand daugther) Available Help at Discharge: Family Type of Home: Mobile home Home Access: Stairs to enter Technical brewer of Steps: 4-5 Entrance Stairs-Rails: Right Home Layout: One level     Bathroom Shower/Tub: Teacher, early years/pre: Standard Bathroom Accessibility: Yes   Home Equipment: Cane - single Barista (2 wheels);BSC/3in1;Wheelchair - manual   Additional Comments: pt reports she is  hoping for a ramp to be built in the near future      Prior Functioning/Environment               Mobility Comments: pt report amb with RW as needed, has "good days and bad days" ADLs Comments: reports typical ADL performance with MOD I, grand daughter assists on days with increased SOB, assist for IADLs        OT Problem List: Decreased activity tolerance      OT Treatment/Interventions: DME and/or AE instruction;Self-care/ADL training;Therapeutic activities;Therapeutic exercise;Energy conservation    OT Goals(Current goals can be found in the care plan section) Acute Rehab OT Goals Patient Stated Goal: go home OT Goal Formulation: With patient Time For Goal Achievement: 08/05/22 Potential to Achieve Goals: Good ADL Goals Pt Will Perform Grooming: with modified independence;sitting;standing Pt Will Perform Upper Body Bathing: with modified independence;with max assist;standing Pt Will Perform Lower Body Bathing: with modified independence;sit to/from stand Pt Will Perform Lower Body Dressing: with modified independence;with adaptive equipment;sit to/from stand Pt Will Transfer to Toilet: with modified independence Pt Will Perform Toileting - Clothing Manipulation and hygiene: with modified independence;sit to/from stand  OT Frequency: Min 2X/week    Co-evaluation              AM-PAC OT "  6 Clicks" Daily Activity     Outcome Measure Help from another person eating meals?: None Help from another person taking care of personal grooming?: None Help from another person toileting, which includes using toliet, bedpan, or urinal?: None Help from another person bathing (including washing, rinsing, drying)?: A Little Help from another person to put on and taking off regular upper body clothing?: None Help from another person to put on and taking off regular lower body clothing?: A Little 6 Click Score: 22   End of Session Equipment Utilized During Treatment: Rolling walker  (2 wheels);Oxygen Nurse Communication: Mobility status  Activity Tolerance: Patient tolerated treatment well Patient left: in bed;with call bell/phone within reach  OT Visit Diagnosis: Unsteadiness on feet (R26.81)                Time: 0211-1552 OT Time Calculation (min): 13 min Charges:  OT General Charges $OT Visit: 1 Visit OT Evaluation $OT Eval Low Complexity: 1 Low  Shanon Payor, OTD OTR/L  07/22/22, 3:29 PM

## 2022-07-22 NOTE — Progress Notes (Signed)
[facility]  ROUNDING NOTE   Subjective:   Danielle [name] 66 year old female with past medical conditions including diabetes, hypertension, lymphedema, breast cancer COPD and chronic kidney disease.  Patient presents to the emergency department of shortness of breath and has been admitted for Pleural effusion [J90] Status post thoracentesis [Z98.890] CHF exacerbation (HCC) [I50.9] Pleural effusion on right [J90] Acute on chronic combined systolic and diastolic CHF (congestive heart failure) (HCC) [I50.43] Acute on chronic respiratory failure with hypoxia ([place]) [J96.21]  Patient is known to our practice and is followed outpatient by Dr. Holley Raring.  She was last seen in office on April 19, 2022.    Patient seen laying in bed Continues to complain of shortness of breath with activity Remains on 4L Denies nausea and vomiting, tolerating meals.     Objective:  Vital signs in last 24 hours:  Temp:  [97.6 F (36.4 C)-98.3 F (36.8 C)] 97.6 F (36.4 C) (10/08 0427) Pulse Rate:  [65-104] 72 (10/08 0845) Resp:  [17-18] 18 (10/08 0845) BP: (81-114)/(64-81) 101/70 (10/08 0845) SpO2:  [97 %-100 %] 100 % (10/08 0845) Weight:  [53.2 kg] 53.2 kg (10/08 0427)  Weight change: -1.838 kg Filed Weights   07/19/22 0600 07/21/22 0353 07/22/22 0427  Weight: 50.3 kg 55 kg 53.2 kg    Intake/Output: I/O last 3 completed shifts: In: 3 [I.V.:3] Out: 300 [Urine:300]   Intake/Output this shift:  No intake/output data recorded.  Physical Exam: General: NAD, resting comfortably  Head: Normocephalic, atraumatic. Moist oral mucosal membranes  Eyes: Anicteric  Lungs:  Clear to auscultation, normal effort, [place] O2  Heart: Irregular rate and rhythm  Abdomen:  Soft, tender, nondistended  Extremities: No peripheral edema.  Neurologic: Nonfocal, moving all four extremities  Skin: No lesions  Access: None    Basic Metabolic Panel: Recent Labs  Lab 07/20/2022 2205 07/20/22 0418  07/21/22 0632 07/22/22 0528  NA 138 141 136 140  K 4.5 3.8 3.7 4.1  CL 105 105 104 109  CO2 25 25 26 27   GLUCOSE 177* 84 82 91  BUN 45* 50* 47* 48*  CREATININE 2.38* 2.77* 2.53* 2.25*  CALCIUM 8.8* 8.9 8.6* 8.5*  MG  --  1.7 1.6* 2.1     Liver Function Tests: Recent Labs  Lab 07/30/2022 2205  AST 24  ALT 18  ALKPHOS 55  BILITOT 0.8  PROT 6.5  ALBUMIN 3.5    No results for input(s): "LIPASE", "AMYLASE" in the last 168 hours. No results for input(s): "AMMONIA" in the last 168 hours.  CBC: Recent Labs  Lab 07/26/2022 1810 07/20/22 0418 07/21/22 0632  WBC 3.9* 3.6* 3.7*  NEUTROABS 3.1  --   --   HGB 9.7* 9.5* 9.2*  HCT 29.9* 28.7* 28.3*  MCV 96.8 94.1 94.0  PLT 189 195 177     Cardiac Enzymes: No results for input(s): "CKTOTAL", "CKMB", "CKMBINDEX", "TROPONINI" in the last 168 hours.  BNP: Invalid input(s): "POCBNP"  CBG: No results for input(s): "GLUCAP" in the last 168 hours.  Microbiology: Results for orders placed or performed during the hospital encounter of 07/22/2022  Resp Panel by RT-PCR (Flu A&B, Covid) Anterior Nasal Swab     Status: None   Collection Time: 07/25/2022  6:43 PM   Specimen: Anterior Nasal Swab  Result Value Ref Range Status   SARS Coronavirus 2 by RT PCR NEGATIVE NEGATIVE Final    Comment: (NOTE) SARS-CoV-2 target nucleic acids are NOT DETECTED.  The SARS-CoV-2 RNA is generally detectable in  upper respiratory specimens during the acute phase of infection. The lowest concentration of SARS-CoV-2 viral copies this assay can detect is 138 copies/mL. A negative result does not preclude SARS-Cov-2 infection and should not be used as the sole basis for treatment or other patient management decisions. A negative result may occur with  improper specimen collection/handling, submission of specimen other than nasopharyngeal swab, presence of viral mutation(s) within the areas targeted by this assay, and inadequate number of viral copies(<138  copies/mL). A negative result must be combined with clinical observations, patient history, and epidemiological information. The expected result is Negative.  Fact Sheet for Patients:  EntrepreneurPulse.com.au  Fact Sheet for Healthcare Providers:  IncredibleEmployment.be  This test is no t yet approved or cleared by the [place] FDA and  has been authorized for detection and/or diagnosis of SARS-CoV-2 by FDA under an Emergency Use Authorization (EUA). This EUA will remain  in effect (meaning this test can be used) for the duration of the COVID-19 declaration under Section 564(b)(1) of the Act, 21 U.S.C.section 360bbb-3(b)(1), unless the authorization is terminated  or revoked sooner.       Influenza A by PCR NEGATIVE NEGATIVE Final   Influenza B by PCR NEGATIVE NEGATIVE Final    Comment: (NOTE) The Xpert Xpress SARS-CoV-2/FLU/RSV plus assay is intended as an aid in the diagnosis of influenza from Nasopharyngeal swab specimens and should not be used as a sole basis for treatment. Nasal washings and aspirates are unacceptable for Xpert Xpress SARS-CoV-2/FLU/RSV testing.  Fact Sheet for Patients: EntrepreneurPulse.com.au  Fact Sheet for Healthcare Providers: IncredibleEmployment.be  This test is not yet approved or cleared by the [place] FDA and has been authorized for detection and/or diagnosis of SARS-CoV-2 by FDA under an Emergency Use Authorization (EUA). This EUA will remain in effect (meaning this test can be used) for the duration of the COVID-19 declaration under Section 564(b)(1) of the Act, 21 U.S.C. section 360bbb-3(b)(1), unless the authorization is terminated or revoked.  Performed at [facility], [address] [place], [place] [address]     Coagulation Studies: No results for input(s): "LABPROT", "INR" in the last 72 hours.  Urinalysis: No results for input(s):  "COLORURINE", "LABSPEC", "PHURINE", "GLUCOSEU", "HGBUR", "BILIRUBINUR", "KETONESUR", "PROTEINUR", "UROBILINOGEN", "NITRITE", "LEUKOCYTESUR" in the last 72 hours.  Invalid input(s): "APPERANCEUR"    Imaging: DG ESOPHAGUS W SINGLE CM (SOL OR THIN BA)  Result Date: 07/21/2022 CLINICAL DATA:  Patient admitted for worsening shortness of breath. Dysphagia, with solids and liquids sticking in chest. EXAM: ESOPHAGUS/BARIUM SWALLOW/TABLET STUDY TECHNIQUE: Single contrast examination was performed using thin liquid barium. This exam was performed by Narda Rutherford, NP, and was supervised and interpreted by Earle Gell, MD. FLUOROSCOPY: Radiation Exposure Index (as provided by the fluoroscopic device): 70.10 mGy Kerma COMPARISON:  CT AP dated 07/20/2022 FINDINGS: Limited examination due to patient's inability to stand and assume routine positions on fluoroscopy table. Exam was also limited by patient's inability to perform bolus swallowing severe nausea. Swallowing: No vestibular penetration or aspiration seen. Pharynx: Unremarkable. Esophagus: Patient was only able to drink a small amount of barium. This shows severe esophageal dysmotility and stasis of contrast in the esophagus. There was poor filling of the distal esophagus, and patient could not be placed in semi-erect or standing position. However, some barium does pass through the esophagus into the stomach, and no obstructing mass or stricture is identified. Hiatal Hernia: None. Ingested 72mm barium tablet: Not given Other: None. IMPRESSION: Technically limited exam as discussed above. Severe  esophageal dysmotility. No evidence of esophageal obstruction. Electronically Signed   By: Marlaine Hind M.D.   On: 07/21/2022 16:36   CT CHEST ABDOMEN PELVIS WO CONTRAST  Result Date: 07/20/2022 CLINICAL DATA:  Abdominal pain, RIGHT lower quadrant pain radiating to LEFT side, acute kidney injury, question sepsis. History CHF, lung cancer, breast cancer, atrial fibrillation,  COPD, former smoker EXAM: CT CHEST, ABDOMEN AND PELVIS WITHOUT CONTRAST TECHNIQUE: Multidetector CT imaging of the chest, abdomen and pelvis was performed following the standard protocol without IV contrast. Patient drank dilute oral contrast for exam. RADIATION DOSE REDUCTION: This exam was performed according to the departmental dose-optimization program which includes automated exposure control, adjustment of the mA and/or kV according to patient size and/or use of iterative reconstruction technique. COMPARISON:  CT angio chest 06/16/2022, CT abdomen and pelvis 05/08/2022 FINDINGS: CT CHEST FINDINGS Cardiovascular: Extensive atherosclerotic calcifications aorta, proximal great vessels, and coronary arteries. Aorta normal caliber. Mild enlargement of cardiac chambers. No pericardial effusion. Mediastinum/Nodes: Esophagus unremarkable. Base of cervical region normal appearance. Prior LEFT mastectomy. No adenopathy. Lungs/Pleura: Small RIGHT pleural effusion. Emphysematous changes with chronic opacity RIGHT upper lobe with spiculated margins, 2.6 x 1.8 cm unchanged. Additional masslike area of opacity in the anterior RIGHT upper lobe extending to the pleural surface measures 4.1 x 1.9 cm, unchanged. Mild compressive atelectasis of RIGHT lower lobe. Subpleural scarring anterolateral LEFT upper lobe suspect related to prior radiation therapy to mastectomy bed, unchanged. No infiltrate identified. Nodular density posteromedial LEFT lower lobe 9 x 6 mm previously 7 x 4 mm, cannot exclude metastasis. Minimal dependent atelectasis LEFT lower lobe. Musculoskeletal: Osseous demineralization without focal bone lesion. CT ABDOMEN PELVIS FINDINGS Hepatobiliary: Question cyst anterior LEFT lobe liver 11 mm unchanged. Additional 6 mm nonspecific low-attenuation focus RIGHT lobe liver image 68 unchanged. Gallbladder and liver otherwise normal appearance. Pancreas: Normal appearance Spleen: Normal appearance Adrenals/Urinary  Tract: Adrenal glands, kidneys, ureters, and bladder normal appearance Stomach/Bowel: Stomach unremarkable. Diverticulosis of descending and sigmoid colon without evidence of diverticulitis. Remaining bowel loops unremarkable. Appendix not visualized. Vascular/Lymphatic: Extensive atherosclerotic calcifications without aneurysm. Significant atherosclerotic calcifications at the origins of the superior mesenteric and celiac arteries. No adenopathy. Reproductive: Atrophic uterus and adnexa with question small exophytic leiomyoma from anterior uterine wall 18 mm diameter tubal ligation clips. Other: No free air or free fluid. No hernia or inflammatory process. Musculoskeletal: Osseous demineralization. Chronic mild superior endplate compression deformities of L2 and L4. IMPRESSION: Small RIGHT pleural effusion with compressive atelectasis of RIGHT lower lobe, slightly decreased. Stable masslike opacities in the RIGHT upper lobe question related to treated tumor. Slight increase in size of a LEFT lower lobe nodule 9 x 6 mm, cannot exclude metastasis. Distal colonic diverticulosis without evidence of diverticulitis. No acute intra-abdominal or intrapelvic abnormalities. Extensive atherosclerotic calcifications including coronary arteries. Aortic Atherosclerosis (ICD10-I70.0) and Emphysema (ICD10-J43.9). Electronically Signed   By: Lavonia Dana M.D.   On: 07/20/2022 19:05   DG Abd 1 View  Result Date: 07/20/2022 CLINICAL DATA:  Right lower quadrant pain EXAM: ABDOMEN - 1 VIEW COMPARISON:  06/20/2022 FINDINGS: The bowel gas pattern is normal. No radio-opaque calculi or other significant radiographic abnormality are seen. Tubal ligation clips are noted. Aortoiliac atherosclerosis. IMPRESSION: Negative. Electronically Signed   By: Davina Poke D.O.   On: 07/20/2022 15:09     Medications:     amiodarone  200 mg Oral Daily   apixaban  2.5 mg Oral BID   atorvastatin  40 mg Oral Daily   citalopram  20 mg Oral  Daily   fluticasone furoate-vilanterol  1 puff Inhalation Daily   And   umeclidinium bromide  1 puff Inhalation Daily   midodrine  5 mg Oral TID WC   pantoprazole  40 mg Oral BID AC   polyethylene glycol  17 g Oral Daily   sodium bicarbonate  1,300 mg Oral Daily   sodium chloride flush  3 mL Intravenous Q12H   acetaminophen **OR** acetaminophen, albuterol, calcium carbonate, HYDROcodone-acetaminophen, metoprolol tartrate, simethicone  Assessment/ Plan:  Danielle [name] is a 66 y.o.  female with past medical conditions including diabetes, hypertension, lymphedema, breast cancer COPD and chronic kidney disease.  Patient presents to the emergency department of shortness of breath and has been admitted for Pleural effusion [J90] Status post thoracentesis [Z98.890] CHF exacerbation ([place]) [I50.9] Pleural effusion on right [J90] Acute on chronic combined systolic and diastolic CHF (congestive heart failure) (HCC) [I50.43] Acute on chronic respiratory failure with hypoxia (HCC) [J96.21]   Acute Kidney Injury on chronic kidney disease stage IIIb with baseline creatinine 1.50 and GFR of 38 on 06/16/22.  Acute kidney injury secondary to cardiorenal syndrome and diuretic therapy Chronic kidney disease is secondary to hypertension and CHF.  No IV contrast exposure.    Creatinine continues to improve. Would recommend Torsemide 40mg  every other day at discharge. She will need hospital follow up with Dr Holley Raring at discharge and assist with monitoring dosage.   Lab Results  Component Value Date   CREATININE 2.25 (H) 07/22/2022   CREATININE 2.53 (H) 07/21/2022   CREATININE 2.77 (H) 07/20/2022    Intake/Output Summary (Last 24 hours) at 07/22/2022 1148 Last data filed at 07/21/2022 2204 Gross per 24 hour  Intake 3 ml  Output --  Net 3 ml     2. Anemia of chronic kidney disease Lab Results  Component Value Date   HGB 9.2 (L) 07/21/2022    Hemoglobin is at target.   3.  Hypertension with  chronic kidney disease.  Home regimen includes torsemide, amiodarone, hydralazine, and isosorbide. Primary team increased to Midodrine 5mg  three times daily for BP support.    4.  Pleural effusions, recurrent.  Seen on chest x-ray on admission.  Radiology successfully performed a right thoracentesis with 900 mL removed.   LOS: 4   10/8/202311:48 AM

## 2022-07-22 NOTE — Evaluation (Signed)
Clinical/Bedside Swallow Evaluation Patient Details  Name: ANNAYA BANGERT MRN: [id number] Date of Birth: 12-Jun-1956  Today's Date: 07/22/2022 Time: SLP Start Time (ACUTE ONLY): 9 SLP Stop Time (ACUTE ONLY): 1435 SLP Time Calculation (min) (ACUTE ONLY): 20 min  Past Medical History:  Past Medical History:  Diagnosis Date   Acute respiratory failure due to COVID-19 ([place]) 09/27/2019   Anemia    Anxiety    Aortic atherosclerosis ([place])    Aortic valve stenosis 02/10/2018   a.) TTE 02/10/2018: EF 55-60%: mild AS with MPG of 12 mmHg. b.) TTE 04/21/2018: EF 35-40%; mild AS with MPG 8 mmHg. c.) TTE 11/07/2019: EF 50-55%; mild AS with MPG 10 mmHg.   Arthritis    Atrial fibrillation (HCC)    a.) CHA2DS2-VASc = 4 (age, sex, HTN, aortic plaque). b.) Rate/rhythm maintained on oral carvedilol; chronically anticoagulated on dose reduced apixaban. c.)  Attempted deployment of LAA occlusive device on 01/11/2021; parameters failed and procedure aborted.   Breast cancer, left ([place]) 2000   a.) T2N1M0; ER/PR (+) --> Tx'd with total mastectomy, LN resection, XRT, and chemotherapy   Cancer of right lung ([place]) 07/30/2016   a.) adenocarcinoma; ALK, ROS1, PDL1, BRAF, EGFR all negative.   CHF (congestive heart failure) (HCC)    Chronic diastolic CHF (congestive heart failure) ([place]) 11/26/2021   CKD (chronic kidney disease), stage IV (HCC)    COPD (chronic obstructive pulmonary disease) (HCC)    Dependence on supplemental oxygen    Depression    Diastolic dysfunction 33/82/6068   a.) TTE 02/10/2017: EF 55-60%; G2DD. b.) TTE 04/21/2018: EF 35-40%; mild LA dilation; mod MV regurgitation. c.) TTE 11/07/2019: EF 50-55%; G1DD.   DOE (dyspnea on exertion)    GERD (gastroesophageal reflux disease)    Heart murmur    History of 2019 novel coronavirus disease (COVID-19) 10/14/2019   HLD (hyperlipidemia)    Hypertension    Long term current use of anticoagulant    a.) apixaban   Lymphedema    Personal history  of chemotherapy    Personal history of radiation therapy    Respiratory tract infection due to COVID-19 virus 09/27/2019   SBO (small bowel obstruction) ([place]) 11/05/2020   Vitamin D deficiency    Past Surgical History:  Past Surgical History:  Procedure Laterality Date   Breast Biospy Left    [facility]   BREAST SURGERY     CARDIOVERSION N/A 02/23/2022   Procedure: CARDIOVERSION;  Surgeon: Corey Skains, MD;  Location: [facility] ORS;  Service: Cardiovascular;  Laterality: N/A;   COLONOSCOPY N/A 04/30/2018   Procedure: COLONOSCOPY;  Surgeon: Virgel Manifold, MD;  Location: [facility] ENDOSCOPY;  Service: Endoscopy;  Laterality: N/A;   COLONOSCOPY N/A 07/22/2018   Procedure: COLONOSCOPY;  Surgeon: Virgel Manifold, MD;  Location: [facility] ENDOSCOPY;  Service: Endoscopy;  Laterality: N/A;   COLONOSCOPY WITH PROPOFOL N/A 09/21/2021   Procedure: COLONOSCOPY WITH PROPOFOL;  Surgeon: Benjamine Sprague, DO;  Location: [place] ENDOSCOPY;  Service: General;  Laterality: N/A;   COLONOSCOPY WITH PROPOFOL N/A 03/11/2022   Procedure: COLONOSCOPY WITH PROPOFOL;  Surgeon: Annamaria Helling, DO;  Location: [facility] ENDOSCOPY;  Service: Gastroenterology;  Laterality: N/A;   DILATION AND CURETTAGE OF UTERUS     ELECTROMAGNETIC NAVIGATION BROCHOSCOPY Right 04/11/2016   Procedure: ELECTROMAGNETIC NAVIGATION BRONCHOSCOPY;  Surgeon: Vilinda Boehringer, MD;  Location: [facility] ORS;  Service: Cardiopulmonary;  Laterality: Right;   ESOPHAGOGASTRODUODENOSCOPY N/A 07/22/2018   Procedure: ESOPHAGOGASTRODUODENOSCOPY (EGD);  Surgeon: Virgel Manifold, MD;  Location: [facility] ENDOSCOPY;  Service:  Endoscopy;  Laterality: N/A;   ESOPHAGOGASTRODUODENOSCOPY (EGD) WITH PROPOFOL N/A 05/07/2018   Procedure: ESOPHAGOGASTRODUODENOSCOPY (EGD) WITH PROPOFOL;  Surgeon: Lucilla Lame, MD;  Location: [place] ENDOSCOPY;  Service: Endoscopy;  Laterality: N/A;   ESOPHAGOGASTRODUODENOSCOPY (EGD) WITH PROPOFOL N/A 04/24/2019   Procedure: ESOPHAGOGASTRODUODENOSCOPY  (EGD) WITH PROPOFOL;  Surgeon: Jonathon Bellows, MD;  Location: [facility] ENDOSCOPY;  Service: Gastroenterology;  Laterality: N/A;   ESOPHAGOGASTRODUODENOSCOPY (EGD) WITH PROPOFOL N/A 01/12/2020   Procedure: ESOPHAGOGASTRODUODENOSCOPY (EGD) WITH PROPOFOL;  Surgeon: Jonathon Bellows, MD;  Location: [facility] ENDOSCOPY;  Service: Gastroenterology;  Laterality: N/A;   ESOPHAGOGASTRODUODENOSCOPY (EGD) WITH PROPOFOL N/A 04/28/2020   Procedure: ESOPHAGOGASTRODUODENOSCOPY (EGD) WITH PROPOFOL;  Surgeon: Jonathon Bellows, MD;  Location: [facility] ENDOSCOPY;  Service: Gastroenterology;  Laterality: N/A;   ESOPHAGOGASTRODUODENOSCOPY (EGD) WITH PROPOFOL N/A 03/11/2022   Procedure: ESOPHAGOGASTRODUODENOSCOPY (EGD) WITH PROPOFOL;  Surgeon: Annamaria Helling, DO;  Location: [facility] ENDOSCOPY;  Service: Gastroenterology;  Laterality: N/A;   EUS N/A 05/07/2019   Procedure: FULL UPPER ENDOSCOPIC ULTRASOUND (EUS) RADIAL;  Surgeon: Jola Schmidt, MD;  Location: [facility] ENDOSCOPY;  Service: Endoscopy;  Laterality: N/A;   ILEOSCOPY N/A 07/22/2018   Procedure: ILEOSCOPY THROUGH STOMA;  Surgeon: Virgel Manifold, MD;  Location: [facility] ENDOSCOPY;  Service: Endoscopy;  Laterality: N/A;   ILEOSTOMY     ILEOSTOMY N/A 09/08/2018   Procedure: ILEOSTOMY REVISION POSSIBLE CREATION;  Surgeon: Herbert Pun, MD;  Location: [facility] ORS;  Service: General;  Laterality: N/A;   ILEOSTOMY CLOSURE N/A 08/15/2018   Procedure: DILATION OF ILEOSTOMY STRICTURE;  Surgeon: Herbert Pun, MD;  Location: [facility] ORS;  Service: General;  Laterality: N/A;   LAPAROTOMY Right 05/04/2018   Procedure: EXPLORATORY LAPAROTOMY right colectomy right and left ostomy;  Surgeon: Herbert Pun, MD;  Location: [facility] ORS;  Service: General;  Laterality: Right;   LEFT ATRIAL APPENDAGE OCCLUSION N/A 01/11/2021   Procedure: LEFT ATRIAL APPENDAGE OCCLUSION ([place]); ABORTED PROCEDURE WITHOUT DEVICE BEING IMPLANTED; Location: [facility]; Surgeon: Mylinda Latina, MD   LUNG  BIOPSY     MASTECTOMY Left    2000, [place]   ROTATOR CUFF REPAIR Right    [place]   XI ROBOTIC ASSISTED COLOSTOMY TAKEDOWN N/A 10/23/2021   Procedure: XI ROBOTIC ASSISTED ILEOSTOMY TAKEDOWN;  Surgeon: Herbert Pun, MD;  Location: [facility] ORS;  Service: General;  Laterality: N/A;  180 minutes for the surgery part please   HPI:  Per H&P: "JOLEA DOLLE is a 66 y.o. female with medical history significant for Paroxysmal A-fib on chronic anticoagulation therapy, history of chronic systolic heart failure, history of adenocarcinoma of the right lung status post radiation therapy, history of stage IIIb chronic kidney disease, aortic valve stenosis, COPD with chronic respiratory failure on 4 L of oxygen continuous, history of left breast cancer status post mastectomy, history of esophageal cancer recurrent right-sided pleural effusion requiring thoracentesis Most recently on 06/19/2022 who presents to the ER for evaluation of worsening shortness of breath from her baseline.  She has associated chest tightness.  Denies one-sided leg pain numbness one-sided leg swelling .his symptoms feel similar to prior episodes when she required thoracentesis." Esophagram  completed 07/21/22, revealing "Severe esophageal dysmotility. No evidence of esophageal obstruction." Chest CT 07/20/22: Small RIGHT pleural effusion with compressive atelectasis of RIGHT lower lobe, slightly decreased. Stable masslike opacities in the RIGHT upper lobe question relate to treated tumor. Slight increase in size of a LEFT lower lobe nodule 9 x 6 mm, cannot exclude metastasis." Pt currently on Dys 3, thin liquids and on her baseline of 4L O2. Pt seen for  swallow assessment in the setting of esophageal dysmotility.    Assessment / Plan / Recommendation  Clinical Impression  Pt presents with minimal oral dysphagia secondary to missing dentition and generalized fatigue. Suspect that pharyngeal swallow is WFL. Trials completed of regular solids, puree,  and regular solids. No overt or subtle s/sx pharyngeal dysphagia noted. No change to vocal quality across trials. Mildly increased mastication time with regular solids, though pt independently utilized liquid was to facilitate oral clearance. Extensive education provided for aspiration and esophageal precautions, including importance of upright positioning (90 degrees during intake and for at least 30 min after intake), slow rate, small bites, alert for intake, trialing smaller/frequent meals, adding sauces/gravies, avoiding dry/challenging solids (breads, dry meats, etc). Pt endorsed understanding and reported use of slow rate, moist foods, and upright positioning at home. Alternative provided for medication administration (crushing medication vs utilizing carrier for added weight), pt reported understanding but denied need at this time. Recommend regular solids (meat cuts, extra sauces) and thin liquids with aspiration and esophageal precautions. Given that education is complete and diet is consistent with baseline, no further SLP services indicated at this time.  SLP Visit Diagnosis: Dysphagia, oropharyngeal phase (R13.12)    Aspiration Risk  Mild aspiration risk    Diet Recommendation  Regular Solids (extra sauces, meat cuts) and Thin liquids   Medication Administration: Other (Comment) (in puree/crushed if desired)    Other  Recommendations Oral Care Recommendations: Oral care BID    Recommendations for follow up therapy are one component of a multi-disciplinary discharge planning process, led by the attending physician.  Recommendations may be updated based on patient status, additional functional criteria and insurance authorization.  Follow up Recommendations No SLP follow up      Assistance Recommended at Discharge Intermittent Supervision/Assistance  Functional Status Assessment Patient has not had a recent decline in their functional status    Swallow Study   General Date of Onset:  07/22/22 HPI: Per H&P: "FABLE HUISMAN is a 66 y.o. female with medical history significant for Paroxysmal A-fib on chronic anticoagulation therapy, history of chronic systolic heart failure, history of adenocarcinoma of the right lung status post radiation therapy, history of stage IIIb chronic kidney disease, aortic valve stenosis, COPD with chronic respiratory failure on 4 L of oxygen continuous, history of left breast cancer status post mastectomy, history of esophageal cancer recurrent right-sided pleural effusion requiring thoracentesis Most recently on 06/19/2022 who presents to the ER for evaluation of worsening shortness of breath from her baseline.  She has associated chest tightness.  Denies one-sided leg pain numbness one-sided leg swelling .his symptoms feel similar to prior episodes when she required thoracentesis." Esophagram  completed 07/21/22, revealing "Severe esophageal dysmotility. No evidence of esophageal obstruction." Chest CT 07/20/22: Small RIGHT pleural effusion with compressive atelectasis of RIGHT lower lobe, slightly decreased. Stable masslike opacities in the RIGHT upper lobe question relate to treated tumor. Slight increase in size of a LEFT lower lobe nodule 9 x 6 mm, cannot exclude metastasis." Pt currently on Dys 3, thin liquids and on her baseline of 4L O2. Pt seen for swallow assessment in the setting of esophageal dysmotility. Type of Study: Bedside Swallow Evaluation Previous Swallow Assessment: t previously seen by SLP in February of this year with recommendations for soft solids and thin liquids. Diet Prior to this Study: Dysphagia 3 (soft);Thin liquids Temperature Spikes Noted: No (Temp 98.3; WBC 3.7) Respiratory Status: Nasal cannula (4L) History of Recent Intubation: No Behavior/Cognition: Alert;Cooperative Oral Cavity  Assessment: Within Functional Limits Oral Care Completed by SLP: Recent completion by staff Oral Cavity - Dentition: Other (Comment) (not top  dentition (dentures not present) and adequate lower dentition) Vision: Functional for self-feeding Self-Feeding Abilities: Able to feed self Patient Positioning: Upright in bed Baseline Vocal Quality: Normal Volitional Cough: Strong Volitional Swallow: Able to elicit    Oral/Motor/Sensory Function Overall Oral Motor/Sensory Function: Within functional limits   Ice Chips Ice chips: Not tested   Thin Liquid Thin Liquid: Within functional limits    Nectar Thick Nectar Thick Liquid: Not tested   Honey Thick Honey Thick Liquid: Not tested   Puree Puree: Within functional limits   Solid     Solid: Within functional limits     [place] Kellie Chisolm Clapp MS CCC-SLP  [place] J Clapp 07/22/2022,3:07 PM

## 2022-07-22 NOTE — TOC Progression Note (Signed)
Transition of Care [facility]) - Progression Note    Patient Details  Name: Danielle [name] MRN: [id number] Date of Birth: 04-13-1956  Transition of Care [facility] CM/SW Contact  Zigmund Daniel Dorian Pod, RN Phone Number: [phone] 07/22/2022, 3:41 PM  Clinical Narrative:    Recommend for Danielle [name]. Spoke with pt today and offered HHPT/OT as pt opt to decline indicating she had her granddaughter available to assist when needed.States she would be able to obtain her medications from the local pharmacy, get to all her medical appointments and has a RW, BSC and WC at home to use as needed.   TOC remains available to assist. No other needs presented at this time.   Expected Discharge Plan: Home/Self Care Barriers to Discharge: Continued Medical Work up  Expected Discharge Plan and Services Expected Discharge Plan: Home/Self Care   Discharge Planning Services: CM Consult   Living arrangements for the past 2 months: Single Family Home                                       Social Determinants of Health (SDOH) Interventions    Readmission Risk Interventions    05/09/2022    3:11 PM 01/29/2022   11:36 AM 11/29/2021   10:49 AM  Readmission Risk Prevention Plan  Transportation Screening Complete Complete Complete  Medication Review Press photographer) Complete Complete Complete  PCP or Specialist appointment within 3-5 days of discharge  Complete Complete  HRI or Home Care Consult Patient refused  Complete  SW Recovery Care/Counseling Consult Complete Complete Complete  Palliative Care Screening Not Applicable Not Applicable Not [place] Not Applicable Not Applicable Not Applicable

## 2022-07-22 NOTE — Progress Notes (Signed)
Progress Note   Patient: Danielle [name] DOB: 11-Jun-1956 DOA: 08/07/2022     4 DOS: the patient was seen and examined on 07/22/2022   Brief hospital course: Danielle [name] is a 66 y.o. female with medical history significant for Paroxysmal A-fib on chronic anticoagulation therapy, history of chronic systolic heart failure, history of adenocarcinoma of the right lung status post radiation therapy, history of stage IIIb chronic kidney disease, aortic valve stenosis, COPD with chronic respiratory failure on 4 L of oxygen continuous, history of left breast cancer status post mastectomy, history of esophageal cancer recurrent right-sided pleural effusion requiring thoracentesis, most recently on 06/19/2022 who presented to the ER on 07/17/2022 for evaluation of worsening shortness of breath with associated chest tightness.    ED course and evaluation --- BP 135/99 with RR 29 with O2 sat 90% on increased O2 flow rate of 5 L/min.  Labs with troponin 15>>17, BNP 2935.  Treated with IV Lasix in the ED.  Chest x-ray showed -- Small right pleural effusion, increased since prior study.  Increasing right basilar consolidation, which may reflect airspace disease or progressive atelectasis.  Mass-like linear consolidation right suprahilar region, unchanged since recent CT.  Patient was admitted to hospitalist service and IV diuresis was continued.    IR was consulted for thoracentesis which was done on 10/5 with 900 cc fluid removed.    Assessment and Plan: * Acute on chronic combined systolic and diastolic CHF (congestive heart failure) (HCC) Echo 01/30/22 -- EF 35-40% with regional WMA's, grade II diastolic dysfunction, moderate MR, mod-severe TR, moderate AS. Presented with recurrent pleural effusion and progressively worsening dyspnea. --IR for thoracentesis --Stop IV Lasix given Cr rising --Strict I/O's, daily weights --Monitor renal function, electrolytes   Hypotension History of same  intermittently and limiting GDMT, and in setting of diuresis.   No fever or leukocytosis, does have abdominal pain. --Reduced metoprolol dose --Hold off hydralazine, metoprolol, Isordil --Hold diuretics until renal function improves --Maintain MAP>65 --Started low dose midodrine, will increase to 5 mg since BP's still low off cardiac meds above.  --Resume rate control and diuretic once BP improves --Monitor BP closely   Acute on chronic respiratory failure with hypoxia (HCC) Baseline on 4 L/min continuous O2. O2 sats on admission were only 90% on 5 l/min in setting of decompensated CHF with recurrent pleural effusion. 10/6: on baseline 4 L O2 today, ongoing DOE --Supplement O2 to keep sats 88-94% --Mgmt of underlying issues as outlined   Recurrent right pleural effusion Presented with shortness of breath, dyspnea on exertion with chest discomfort, desaturating to the low 80s on her baseline flow rate.  Chest xray with increasing right pleural effusion from prior, BNP over 2000.   Chart reviewed: prior thoracenteses done this year on 1/30, 2/8, 4/16, 5/12, 7/25, 8/8, 9/5 --IR was consulted on admission. --Underwent thoracentesis on 10/5 with 900 cc fluid removed. --Diuresis now held as outlined --Monitor respiratory status  Paroxysmal atrial fibrillation ([place]) Rates overall controlled. Reduced metoprolol 50 >> 12.5 mg BID due to lower BP's.  Had to completely hold metop for past couple of days. BP's better on midodrine. --Resume metoprolol 25 mg PO BID with hold parameters for low BP --Telemetry --Continue amiodarone and Elqius  Acute kidney injury superimposed on CKD (HCC) Baseline CKD stage 4, cr was stable on admission 1.84, but increased to 2.38 after IV diuresis >> 2.77 >> 2.53 >> 2.25 today. --Diuresis has been on hold --Nephrology consulted --Will resume torsemide 40 mg  PO every other day starting tomorrow --Monitor BMP  --Avoid nephrotoxins and renally dose  meds  Essential hypertension Now off IV Lasix  Hold hydralazine, metprolol, Isordil for now (BP's soft even with these all being held) Hold torsemide (AKI), resume diuretics per nephrology. Started on low dose midodrine.  CKD (chronic kidney disease), stage IV ([place]) Now with AKI likely due to diuresis.  See AKI for plan.  Esophageal dysmotility Pt reports difficulty with food and especially pills getting stuck in her esophagus and come back up at times, take a long time to pass into her stomach, causes a lot of discomfort.  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 --Reviewed prior EGD results from May this year, no mention of esophageal findings. --Esophagram 10/7 showed severe esophageal dysmotility, no evidence of obstruction or stenosis --48 hour calorie count --Dietitian and SLP consults --Pt may require feeding tube and says she would want it, if needed for adequate nutrition   Prolonged QT interval QTc was 592 on EKG on admission.   Improved - repeat EKG 10/6 QTc 450 ms. --Serial EKG's to monitor --Keep K>4, Mg>2 --Telemetry monitoring  Malignant neoplasm of upper lobe of right lung Malignant neoplasm of lower third of esophagus Patient is status post radiation therapy for esophageal cancer S/p radiation therapy for lung cancer Follow-up with oncology  HLD (hyperlipidemia) On Lipitor  Abdominal pain Pt reported new onset RLQ abdominal pain today (10/6), radiating across abdomen to her left side.  It got a bit better after a BM, but persistent and severe at times. --Abdominal xray 10/6 negative --CT abd/pelvis if xray 10/6 also no acute findings to explain --Trial of simethicone --Pain control PRN --Monitor closely  Anemia of chronic disease Hemoglobin at baseline at 9.7  Hypomagnesemia Magnesium 2.1 from 1.6, replaced 10/7. Maintain Mg > 2.0 given history of QTc prolongation and A-fib. Monitor & replace PRN  Chronic obstructive pulmonary  disease (HCC) Severe COPD with chronic respiratory failure on 4 L of continuous O2 at home. --Continue scheduled and PRN bronchodilators --Continue inhaled steroids  Depression Continue Celexa 20 mg daily  Carcinoma of overlapping sites of left breast in female, estrogen receptor positive ([place]) Noted. No acute issues.        Subjective: Patient seen awake sitting up in bed.  We discussed her esophagram and severe esophageal dysmotility, with unfortunately no real options to cure the problem.  She reports even liquids get stuck at times, not just solid food or pills.  She asks if this is why she's been losing weight.  She reports her dyspnea on exertion is worse today than yesterday.  We discussed nutrition, if not getting enough to sustain, would need artificial nutrition by feeding tube.  She states she would want to have one placed if it would benefit her, despite her other serious chronic illnesses and recurrent breathing troubles.     Physical Exam: Vitals:   07/22/22 0845 07/22/22 1315 07/22/22 1519 07/22/22 1640  BP: 101/70 110/74  (!) 131/99  Pulse: 72 86  92  Resp: 18 18  18   Temp:    (!) 97.5 F (36.4 C)  TempSrc:    Oral  SpO2: 100% 96% 92% 100%  Weight:       General exam: awake, alert, no acute distress HEENT: moist mucus membranes, hearing grossly normal  Respiratory system: normal respiratory effort at rest on 4 L/min [place] O2.  Diminished bases otherwise clear b/l Cardiovascular system: normal N2/D7,  RRR, systolic murmur  noted, no peripheral edema Gastrointestinal system: abdomen is soft, non-tender, no guarding or rebound Central nervous system: A&O x3. no gross focal neurologic deficits, normal speech Extremities: moves all, no edema, normal tone Psychiatry: normal mood, congruent affect, judgement and insight appear normal   Data Reviewed:  Notable labs: Creatinine 2.38>>2.77>> 2.53>>2.25 , BUN 48, magnesium 1.6>>2.1 (replaced yesterday).      Family  Communication: None at bedside. Pt reports she will update her daughter, declines for me to call her.  Disposition: Status is: Inpatient Remains inpatient appropriate because: Monitoring for further renal function improvement.  Ongoing evaluation of abdominal pain and difficulty swallowing.  Calorie count underway due to inadequate PO intake    Planned Discharge Destination: Home    Time spent: 45 minutes  Author: Ezekiel Slocumb, DO 07/22/2022 6:46 PM  For on call review [email or web address].

## 2022-07-23 ENCOUNTER — Inpatient Hospital Stay: Payer: Medicare Other

## 2022-07-23 DIAGNOSIS — K224 Dyskinesia of esophagus: Secondary | ICD-10-CM | POA: Diagnosis not present

## 2022-07-23 DIAGNOSIS — N184 Chronic kidney disease, stage 4 (severe): Secondary | ICD-10-CM | POA: Diagnosis not present

## 2022-07-23 DIAGNOSIS — J90 Pleural effusion, not elsewhere classified: Secondary | ICD-10-CM

## 2022-07-23 DIAGNOSIS — Z7189 Other specified counseling: Secondary | ICD-10-CM

## 2022-07-23 DIAGNOSIS — I5043 Acute on chronic combined systolic (congestive) and diastolic (congestive) heart failure: Secondary | ICD-10-CM | POA: Diagnosis not present

## 2022-07-23 LAB — BASIC METABOLIC PANEL
Anion gap: 4 — ABNORMAL LOW (ref 5–15)
BUN: 34 mg/dL — ABNORMAL HIGH (ref 8–23)
CO2: 26 mmol/L (ref 22–32)
Calcium: 8.6 mg/dL — ABNORMAL LOW (ref 8.9–10.3)
Chloride: 109 mmol/L (ref 98–111)
Creatinine, Ser: 1.8 mg/dL — ABNORMAL HIGH (ref 0.44–1.00)
GFR, Estimated: 30 mL/min — ABNORMAL LOW (ref >60)
Glucose, Bld: 92 mg/dL (ref 70–99)
Potassium: 4.5 mmol/L (ref 3.5–5.1)
Sodium: 139 mmol/L (ref 135–145)

## 2022-07-23 MED ORDER — NEPRO/CARBSTEADY PO LIQD: 237.0000 mL | Freq: Three times a day (TID) | ORAL | Status: DC

## 2022-07-23 NOTE — Progress Notes (Signed)
   Heart Failure Nurse navigator Note  Met with patient today, she states that she feels that she is a little bit more short of breath today with activity than she was yesterday.  Discussed  her fluid restriction.  She states that it is very confusing she has 1 person that tells her that she needs to limit her fluid intake and she has another one telling her that she needs to drink more, she says I do not know who to believe anymore.  Explained with her heart function that it would be better if she stuck with the fluid restriction that the kidney doctors had recommended.  She had no further questions.  Pricilla Riffle RN CHFN

## 2022-07-23 NOTE — Progress Notes (Signed)
[facility]  ROUNDING NOTE   Subjective:   Danielle [name] 66 year old female with past medical conditions including diabetes, hypertension, lymphedema, breast cancer COPD and chronic kidney disease.  Patient presents to the emergency department of shortness of breath and has been admitted for Pleural effusion [J90] Status post thoracentesis [Z98.890] CHF exacerbation (HCC) [I50.9] Pleural effusion on right [J90] Acute on chronic combined systolic and diastolic CHF (congestive heart failure) (HCC) [I50.43] Acute on chronic respiratory failure with hypoxia ([place]) [J96.21]  Patient is known to our practice and is followed outpatient by Dr. Holley Raring.  She was last seen in office on April 19, 2022.    Update: Patient seen laying in bed  States shortness of breath with activity No lower extremity edema   Objective:  Vital signs in last 24 hours:  Temp:  [97.5 F (36.4 C)-98.2 F (36.8 C)] 98 F (36.7 C) (10/09 1153) Pulse Rate:  [64-108] 68 (10/09 1153) Resp:  [16-22] 18 (10/09 1153) BP: (95-131)/(70-99) 95/72 (10/09 1153) SpO2:  [92 %-100 %] 99 % (10/09 1153) Weight:  [54.4 kg] 54.4 kg (10/09 0544)  Weight change: 1.225 kg Filed Weights   07/21/22 0353 07/22/22 0427 07/23/22 0544  Weight: 55 kg 53.2 kg 54.4 kg    Intake/Output: I/O last 3 completed shifts: In: 6 [I.V.:6] Out: -    Intake/Output this shift:  Total I/O In: 240 [P.O.:240] Out: -   Physical Exam: General: NAD, resting comfortably  Head: Normocephalic, atraumatic. Moist oral mucosal membranes  Eyes: Anicteric  Lungs:  Clear to auscultation, normal effort, [place]  O2  Heart: Irregular rate and rhythm  Abdomen:  Soft, tender, nondistended  Extremities: No peripheral edema.  Neurologic: Nonfocal, moving all four extremities  Skin: No lesions  Access: None    Basic Metabolic Panel: Recent Labs  Lab 07/17/2022 2205 07/20/22 0418 07/21/22 0632 07/22/22 0528 07/23/22 0507  NA 138 141 136 140 139   K 4.5 3.8 3.7 4.1 4.5  CL 105 105 104 109 109  CO2 25 25 26 27 26   GLUCOSE 177* 84 82 91 92  BUN 45* 50* 47* 48* 34*  CREATININE 2.38* 2.77* 2.53* 2.25* 1.80*  CALCIUM 8.8* 8.9 8.6* 8.5* 8.6*  MG  --  1.7 1.6* 2.1  --      Liver Function Tests: Recent Labs  Lab 08/07/2022 2205  AST 24  ALT 18  ALKPHOS 55  BILITOT 0.8  PROT 6.5  ALBUMIN 3.5    No results for input(s): "LIPASE", "AMYLASE" in the last 168 hours. No results for input(s): "AMMONIA" in the last 168 hours.  CBC: Recent Labs  Lab 08/03/2022 1810 07/20/22 0418 07/21/22 0632  WBC 3.9* 3.6* 3.7*  NEUTROABS 3.1  --   --   HGB 9.7* 9.5* 9.2*  HCT 29.9* 28.7* 28.3*  MCV 96.8 94.1 94.0  PLT 189 195 177     Cardiac Enzymes: No results for input(s): "CKTOTAL", "CKMB", "CKMBINDEX", "TROPONINI" in the last 168 hours.  BNP: Invalid input(s): "POCBNP"  CBG: No results for input(s): "GLUCAP" in the last 168 hours.  Microbiology: Results for orders placed or performed during the hospital encounter of 07/25/2022  Resp Panel by RT-PCR (Flu A&B, Covid) Anterior Nasal Swab     Status: None   Collection Time: 07/30/2022  6:43 PM   Specimen: Anterior Nasal Swab  Result Value Ref Range Status   SARS Coronavirus 2 by RT PCR NEGATIVE NEGATIVE Final    Comment: (NOTE) SARS-CoV-2 target nucleic acids are NOT  DETECTED.  The SARS-CoV-2 RNA is generally detectable in upper respiratory specimens during the acute phase of infection. The lowest concentration of SARS-CoV-2 viral copies this assay can detect is 138 copies/mL. A negative result does not preclude SARS-Cov-2 infection and should not be used as the sole basis for treatment or other patient management decisions. A negative result may occur with  improper specimen collection/handling, submission of specimen other than nasopharyngeal swab, presence of viral mutation(s) within the areas targeted by this assay, and inadequate number of viral copies(<138 copies/mL). A  negative result must be combined with clinical observations, patient history, and epidemiological information. The expected result is Negative.  Fact Sheet for Patients:  EntrepreneurPulse.com.au  Fact Sheet for Healthcare Providers:  IncredibleEmployment.be  This test is no t yet approved or cleared by the [place] FDA and  has been authorized for detection and/or diagnosis of SARS-CoV-2 by FDA under an Emergency Use Authorization (EUA). This EUA will remain  in effect (meaning this test can be used) for the duration of the COVID-19 declaration under Section 564(b)(1) of the Act, 21 U.S.C.section 360bbb-3(b)(1), unless the authorization is terminated  or revoked sooner.       Influenza A by PCR NEGATIVE NEGATIVE Final   Influenza B by PCR NEGATIVE NEGATIVE Final    Comment: (NOTE) The Xpert Xpress SARS-CoV-2/FLU/RSV plus assay is intended as an aid in the diagnosis of influenza from Nasopharyngeal swab specimens and should not be used as a sole basis for treatment. Nasal washings and aspirates are unacceptable for Xpert Xpress SARS-CoV-2/FLU/RSV testing.  Fact Sheet for Patients: EntrepreneurPulse.com.au  Fact Sheet for Healthcare Providers: IncredibleEmployment.be  This test is not yet approved or cleared by the [place] FDA and has been authorized for detection and/or diagnosis of SARS-CoV-2 by FDA under an Emergency Use Authorization (EUA). This EUA will remain in effect (meaning this test can be used) for the duration of the COVID-19 declaration under Section 564(b)(1) of the Act, 21 U.S.C. section 360bbb-3(b)(1), unless the authorization is terminated or revoked.  Performed at [facility], [address] [place], [place] [address]     Coagulation Studies: No results for input(s): "LABPROT", "INR" in the last 72 hours.  Urinalysis: No results for input(s): "COLORURINE",  "LABSPEC", "PHURINE", "GLUCOSEU", "HGBUR", "BILIRUBINUR", "KETONESUR", "PROTEINUR", "UROBILINOGEN", "NITRITE", "LEUKOCYTESUR" in the last 72 hours.  Invalid input(s): "APPERANCEUR"    Imaging: DG Chest Port 1 View  Result Date: 07/23/2022 CLINICAL DATA:  Pleural effusion. EXAM: PORTABLE CHEST 1 VIEW COMPARISON:  Chest radiograph 07/19/2022 FINDINGS: Multiple monitoring leads overlie the patient. Stable cardiomegaly. Aortic atherosclerosis. There appears to be worsening layering right pleural effusion with increased underlying opacities within the right lung. Similar-appearing heterogeneous opacities right upper lung. Similar appearance of the left lung. No pneumothorax. Thoracic spine degenerative changes. IMPRESSION: Worsening layering right pleural effusion with underlying opacities which may represent atelectasis or infection. Electronically Signed   By: Lovey Newcomer M.D.   On: 07/23/2022 11:39     Medications:     amiodarone  200 mg Oral Daily   apixaban  2.5 mg Oral BID   atorvastatin  40 mg Oral Daily   citalopram  20 mg Oral Daily   fluticasone furoate-vilanterol  1 puff Inhalation Daily   And   umeclidinium bromide  1 puff Inhalation Daily   metoprolol tartrate  25 mg Oral BID   midodrine  5 mg Oral TID WC   pantoprazole  40 mg Oral BID AC   polyethylene glycol  17 g Oral Daily   sodium bicarbonate  1,300 mg Oral Daily   sodium chloride flush  3 mL Intravenous Q12H   torsemide  40 mg Oral QODAY   acetaminophen **OR** acetaminophen, albuterol, calcium carbonate, HYDROcodone-acetaminophen, simethicone  Assessment/ Plan:  Danielle [name] is a 66 y.o.  female with past medical conditions including diabetes, hypertension, lymphedema, breast cancer COPD and chronic kidney disease.  Patient presents to the emergency department of shortness of breath and has been admitted for Pleural effusion [J90] Status post thoracentesis [Z98.890] CHF exacerbation ([place]) [I50.9] Pleural  effusion on right [J90] Acute on chronic combined systolic and diastolic CHF (congestive heart failure) (HCC) [I50.43] Acute on chronic respiratory failure with hypoxia (HCC) [J96.21]   Acute Kidney Injury on chronic kidney disease stage IIIb with baseline creatinine 1.50 and GFR of 38 on 06/16/22.  Acute kidney injury secondary to cardiorenal syndrome and diuretic therapy Chronic kidney disease is secondary to hypertension and CHF.  No IV contrast exposure.    Creatinine continues to improve. Torsemide 40mg  every other day restarted yesterday. Will monitor and make adjustments   Lab Results  Component Value Date   CREATININE 1.80 (H) 07/23/2022   CREATININE 2.25 (H) 07/22/2022   CREATININE 2.53 (H) 07/21/2022    Intake/Output Summary (Last 24 hours) at 07/23/2022 1504 Last data filed at 07/23/2022 1021 Gross per 24 hour  Intake 243 ml  Output --  Net 243 ml     2. Anemia of chronic kidney disease Lab Results  Component Value Date   HGB 9.2 (L) 07/21/2022    Hemoglobin at goal  3.  Hypertension with chronic kidney disease.  Home regimen includes torsemide, amiodarone, hydralazine, and isosorbide. Continue Midodrine 5mg  three times daily for BP support.  Torsemide ordered as above.   4.  Pleural effusions, recurrent.  Seen on chest x-ray on admission.  Radiology performed right thoracentesis with 900 mL removed.   LOS: 5   10/9/20233:04 PM

## 2022-07-23 NOTE — Progress Notes (Signed)
Progress Note   Patient: Danielle [name] UXN:[id number] DOB: 10-Feb-1956 DOA: 07/22/2022     5 DOS: the patient was seen and examined on 07/23/2022   Brief hospital course: ALYNNA HARGROVE is a 66 y.o. 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    ED course and evaluation --- BP 135/99 with RR 29 with O2 sat 90% on increased O2 flow rate of 5 L/min.  Labs with troponin 15>>17, BNP 2935.  Treated with IV Lasix in the ED.  Chest x-ray showed -- Small right pleural effusion, increased since prior study.  Increasing right basilar consolidation, which may reflect airspace disease or progressive atelectasis.  Mass-like linear consolidation right suprahilar region, unchanged since recent CT.  Patient was admitted to hospitalist service and IV diuresis was continued.    IR was consulted for thoracentesis which was done on 10/5 with 900 cc fluid removed.   Hospital course complicated by: --Acute kidney injury after diuresis due to cardiorenal physiology.  Nephrology was consulted.   Renal function improved with diuretics held, but pt with persistent and worsening exertional dyspnea again. --Abdominal pain for which evaluation including abdominal xray and CT abdomen/pelvis was unremarkable. --Swallowing difficulties with severe esophageal dysmotility discovered on barium esophagram.      Assessment and Plan: * Acute  on chronic combined systolic and diastolic CHF (congestive heart failure) ([place]) Echo 01/30/22 -- EF 35-40% with regional WMA's, grade II diastolic dysfunction, moderate MR, mod-severe TR, moderate AS. Presented with recurrent pleural effusion and progressively worsening dyspnea. --IR for thoracentesis --Stopped IV Lasix given Cr rising --Resume Torsemide 40 mg EVERY OTHER DAY per nephrology --Repeat CXR today to assess pleural effusion, her DOE has steadily worsened with holding diuretics --Strict I/O's, daily weights --Monitor renal function, electrolytes   Hypotension History of same intermittently and limiting GDMT, and in setting of diuresis.   No fever or leukocytosis, does have abdominal pain. --Reduced metoprolol dose --Hold off hydralazine, metoprolol, Isordil --Hold diuretics until renal function improves --Maintain MAP>65 --Started low dose midodrine, will increase to 5 mg since BP's still low off cardiac meds above.  --Resume rate control and diuretic once BP improves --Monitor BP closely   Acute on chronic respiratory failure with hypoxia (HCC) Baseline on 4 L/min continuous O2. O2 sats on admission were only 90% on 5 l/min in setting of decompensated CHF with recurrent pleural effusion. 10/6: on baseline 4 L O2 today, ongoing DOE --Supplement O2 to keep sats 88-94% --Mgmt of underlying issues as outlined   Recurrent right pleural effusion Presented with shortness of breath, dyspnea on exertion with chest discomfort, desaturating to the low 80s on her baseline flow rate.  Chest xray with increasing right pleural effusion from prior, BNP over 2000.   Chart reviewed: prior thoracenteses done this year on 1/30, 2/8, 4/16, 5/12, 7/25, 8/8, 9/5 --IR was consulted on admission. --Underwent thoracentesis on 10/5 with 900 cc fluid removed. --Diuresis now held as outlined --Monitor respiratory status --Repeat CXR today --Pulmonology consulted given pt requiring serial  thoracenteses  Paroxysmal atrial fibrillation (HCC) Rates overall controlled. Reduced metoprolol 50 >> 12.5 mg BID due to lower BP's.  Had to completely hold metop for past couple of days. BP's better on midodrine. --Resume metoprolol 25 mg PO BID with hold parameters for low BP --Telemetry --Continue amiodarone and Elqius  Acute kidney injury superimposed on CKD (HCC) Baseline CKD stage 4, cr was stable on admission 1.84, but increased to 2.38 after IV diuresis. Cr trend: 2.77>>2.53>> 2.25 >> 1.80 today. --Diuresis has been on hold --Nephrology consulted --Resume torsemide 40 mg PO every other day  --Monitor BMP  --Avoid nephrotoxins and renally dose meds  Essential hypertension Now off IV Lasix  Hold hydralazine, metprolol, Isordil for now (BP's soft even with these all being held) Hold torsemide (AKI), resume diuretics per nephrology. Started on low dose midodrine.  CKD (chronic kidney disease), stage IV ([place]) Developed AKI likely due to diuresis, cardiorenal physiology. Renal function improved with holding diuretics See AKI for plan. Nephrology following.  Esophageal dysmotility Pt reports difficulty with food and especially pills getting stuck in her esophagus and come back up at times, take a long time to pass into her stomach, causes a lot of discomfort.  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 --Reviewed prior EGD results from May this year, no mention of esophageal findings. --Esophagram 10/7 showed severe esophageal dysmotility, no evidence of obstruction or stenosis --48 hour calorie count --Dietitian and SLP consults --Pt may require feeding tube and says she would want it, if needed for adequate nutrition --Palliative care consulted  Prolonged QT interval QTc was 592 on EKG on admission.   Improved - repeat EKG 10/6 QTc 450 ms. --Serial EKG's to monitor --Keep K>4, Mg>2 --Telemetry monitoring  Malignant neoplasm of upper lobe of right  lung Malignant neoplasm of lower third of esophagus Patient is status post radiation therapy for esophageal cancer S/p radiation therapy for lung cancer Follow-up with oncology  HLD (hyperlipidemia) On Lipitor  Abdominal pain Pt reported new onset RLQ abdominal pain today (10/6), radiating across abdomen to her left side.  It got a bit better after a BM, but persistent and severe at times. 10/9 - pt reports this is better --Abdominal xray 10/6 negative --CT abd/pelvis if xray 10/6 also no acute findings to explain --Trial of simethicone --Pain control PRN --Monitor closely  Anemia of chronic disease Hemoglobin at baseline  Hypomagnesemia Magnesium 2.1 from 1.6, replaced 10/7. Maintain Mg > 2.0 given history of QTc prolongation and A-fib. Monitor & replace PRN  Chronic obstructive pulmonary disease (HCC) Severe COPD with chronic respiratory failure on 4 L of continuous O2 at home. --Continue scheduled and PRN bronchodilators --Continue inhaled steroids  Depression Continue Celexa 20 mg daily  Carcinoma of overlapping sites of left breast in female, estrogen receptor positive ([place]) Noted. No acute issues.        Subjective: Patient seen awake sitting up in bed.  We discussed her esophagram and severe esophageal dysmotility, with unfortunately no real options to cure the problem.  She reports even liquids get stuck at times, not just solid food or pills.  She asks if this is why she's been losing weight.  She reports her dyspnea on exertion is worse today than yesterday.  We discussed nutrition, if not getting enough to sustain, would need artificial nutrition by feeding tube.  She states she would want to have one placed if it would benefit her, despite her other serious chronic illnesses and recurrent breathing  troubles.     Physical Exam: Vitals:   07/23/22 0335 07/23/22 0544 07/23/22 0755 07/23/22 1153  BP:  115/89 116/83 95/72  Pulse:  (!) 105 64 68  Resp:  17 (!)  22 18  Temp:  98.2 F (36.8 C) 97.9 F (36.6 C) 98 F (36.7 C)  TempSrc:  Oral    SpO2: 99% 97% 98% 99%  Weight:  54.4 kg     General exam: awake, alert, no acute distress HEENT: moist mucus membranes, hearing grossly normal  Respiratory system: normal respiratory effort at rest on 4 L/min [place] O2.  Diminished bases otherwise clear b/l Cardiovascular system: normal O2/V0,  RRR, systolic murmur noted, no peripheral edema Gastrointestinal system: abdomen is soft, non-tender, no guarding or rebound Central nervous system: A&O x3. no gross focal neurologic deficits, normal speech Extremities: moves all, no edema, normal tone Psychiatry: normal mood, congruent affect, judgement and insight appear normal   Data Reviewed:  Notable labs: Creatinine 2.38>>2.77>> 2.53>>2.25>>1.80 , BUN 34.      Family Communication: None at bedside. Pt reports she will update her daughter, declines for me to call her.  Consults: Palliative Care Pulmonology Nephrology   Disposition: Status is: Inpatient Remains inpatient appropriate because: Monitoring for further renal function improvement.  Ongoing evaluation of abdominal pain and difficulty swallowing.  Calorie count underway due to inadequate PO intake    Planned Discharge Destination: Home    Time spent: 45 minutes  Author: Ezekiel Slocumb, DO 07/23/2022 3:14 PM  For on call review [email or web address].

## 2022-07-23 NOTE — Consult Note (Signed)
PULMONOLOGY         Date: 07/23/2022,   MRN# [id number] GISSELL BARRA 1956-04-15     AdmissionWeight: 50.3 kg                 CurrentWeight: 54.4 kg  Referring provider: Dr Arbutus Ped   CHIEF COMPLAINT:   Recurrent pleural effusions and advanced metastatic cancer.    HISTORY OF PRESENT ILLNESS   This is a very pleasant 66yo F with hx of advanced COPD, chronic anemia, paroxysmal AFchronic anticoagulation therapy, history of chronic systolic heart failure, history of adenocarcinoma of the right lung status post radiation therapy, history of stage IIIb chronic kidney disease, aortic valve stenosis, COPD with chronic respiratory failure on 4 L of oxygen continuous, history of left breast cancer status post mastectomy, history of esophageal cancer recurrent right-sided pleural effusion requiring thoracentesis Most recently on 06/19/2022 who presents to the ER for evaluation of worsening shortness of breath from her baseline.  She has associated chest tightness. She denies having mucus on expectoration, she is on 3-4L/min during my evaluation and notes when attempting to ambulate she has dyspnea.    PAST MEDICAL HISTORY   Past Medical History:  Diagnosis Date   Acute respiratory failure due to COVID-19 ([place]) 09/27/2019   Anemia    Anxiety    Aortic atherosclerosis ([place])    Aortic valve stenosis 02/10/2018   a.) TTE 02/10/2018: EF 55-60%: mild AS with MPG of 12 mmHg. b.) TTE 04/21/2018: EF 35-40%; mild AS with MPG 8 mmHg. c.) TTE 11/07/2019: EF 50-55%; mild AS with MPG 10 mmHg.   Arthritis    Atrial fibrillation (HCC)    a.) CHA2DS2-VASc = 4 (age, sex, HTN, aortic plaque). b.) Rate/rhythm maintained on oral carvedilol; chronically anticoagulated on dose reduced apixaban. c.)  Attempted deployment of LAA occlusive device on 01/11/2021; parameters failed and procedure aborted.   Breast cancer, left ([place]) 2000   a.) T2N1M0; ER/PR (+) --> Tx'd with total mastectomy, LN resection,  XRT, and chemotherapy   Cancer of right lung ([place]) 07/30/2016   a.) adenocarcinoma; ALK, ROS1, PDL1, BRAF, EGFR all negative.   CHF (congestive heart failure) (HCC)    Chronic diastolic CHF (congestive heart failure) ([place]) 11/26/2021   CKD (chronic kidney disease), stage IV (HCC)    COPD (chronic obstructive pulmonary disease) (HCC)    Dependence on supplemental oxygen    Depression    Diastolic dysfunction 42/68/3419   a.) TTE 02/10/2017: EF 55-60%; G2DD. b.) TTE 04/21/2018: EF 35-40%; mild LA dilation; mod MV regurgitation. c.) TTE 11/07/2019: EF 50-55%; G1DD.   DOE (dyspnea on exertion)    GERD (gastroesophageal reflux disease)    Heart murmur    History of 2019 novel coronavirus disease (COVID-19) 10/14/2019   HLD (hyperlipidemia)    Hypertension    Long term current use of anticoagulant    a.) apixaban   Lymphedema    Personal history of chemotherapy    Personal history of radiation therapy    Respiratory tract infection due to COVID-19 virus 09/27/2019   SBO (small bowel obstruction) ([place]) 11/05/2020   Vitamin D deficiency      SURGICAL HISTORY   Past Surgical History:  Procedure Laterality Date   Breast Biospy Left    [facility]   BREAST SURGERY     CARDIOVERSION N/A 02/23/2022   Procedure: CARDIOVERSION;  Surgeon: Corey Skains, MD;  Location: [facility] ORS;  Service: Cardiovascular;  Laterality: N/A;   COLONOSCOPY N/A 04/30/2018  Procedure: COLONOSCOPY;  Surgeon: Virgel Manifold, MD;  Location[facility] ENDOSCOPY;  Service: Endoscopy;  Laterality: N/A;   COLONOSCOPY N/A 07/22/2018   Procedure: COLONOSCOPY;  Surgeon: Virgel Manifold, MD;  Location: [facility] ENDOSCOPY;  Service: Endoscopy;  Laterality: N/A;   COLONOSCOPY WITH PROPOFOL N/A 09/21/2021   Procedure: COLONOSCOPY WITH PROPOFOL;  Surgeon: Benjamine Sprague, DO;  Location: [facility] ENDOSCOPY;  Service: General;  Laterality: N/A;   COLONOSCOPY WITH PROPOFOL N/A 03/11/2022   Procedure: COLONOSCOPY WITH PROPOFOL;  Surgeon:  Annamaria Helling, DO;  Location: [facility] ENDOSCOPY;  Service: Gastroenterology;  Laterality: N/A;   DILATION AND CURETTAGE OF UTERUS     ELECTROMAGNETIC NAVIGATION BROCHOSCOPY Right 04/11/2016   Procedure: ELECTROMAGNETIC NAVIGATION BRONCHOSCOPY;  Surgeon: Vilinda Boehringer, MD;  Location: [facility] ORS;  Service: Cardiopulmonary;  Laterality: Right;   ESOPHAGOGASTRODUODENOSCOPY N/A 07/22/2018   Procedure: ESOPHAGOGASTRODUODENOSCOPY (EGD);  Surgeon: Virgel Manifold, MD;  Location: [facility] ENDOSCOPY;  Service: Endoscopy;  Laterality: N/A;   ESOPHAGOGASTRODUODENOSCOPY (EGD) WITH PROPOFOL N/A 05/07/2018   Procedure: ESOPHAGOGASTRODUODENOSCOPY (EGD) WITH PROPOFOL;  Surgeon: Lucilla Lame, MD;  Location: [facility] ENDOSCOPY;  Service: Endoscopy;  Laterality: N/A;   ESOPHAGOGASTRODUODENOSCOPY (EGD) WITH PROPOFOL N/A 04/24/2019   Procedure: ESOPHAGOGASTRODUODENOSCOPY (EGD) WITH PROPOFOL;  Surgeon: Jonathon Bellows, MD;  Location: [facility] ENDOSCOPY;  Service: Gastroenterology;  Laterality: N/A;   ESOPHAGOGASTRODUODENOSCOPY (EGD) WITH PROPOFOL N/A 01/12/2020   Procedure: ESOPHAGOGASTRODUODENOSCOPY (EGD) WITH PROPOFOL;  Surgeon: Jonathon Bellows, MD;  Location: [facility] ENDOSCOPY;  Service: Gastroenterology;  Laterality: N/A;   ESOPHAGOGASTRODUODENOSCOPY (EGD) WITH PROPOFOL N/A 04/28/2020   Procedure: ESOPHAGOGASTRODUODENOSCOPY (EGD) WITH PROPOFOL;  Surgeon: Jonathon Bellows, MD;  Location: [facility] ENDOSCOPY;  Service: Gastroenterology;  Laterality: N/A;   ESOPHAGOGASTRODUODENOSCOPY (EGD) WITH PROPOFOL N/A 03/11/2022   Procedure: ESOPHAGOGASTRODUODENOSCOPY (EGD) WITH PROPOFOL;  Surgeon: Annamaria Helling, DO;  Location: [facility] ENDOSCOPY;  Service: Gastroenterology;  Laterality: N/A;   EUS N/A 05/07/2019   Procedure: FULL UPPER ENDOSCOPIC ULTRASOUND (EUS) RADIAL;  Surgeon: Jola Schmidt, MD;  Location: [facility] ENDOSCOPY;  Service: Endoscopy;  Laterality: N/A;   ILEOSCOPY N/A 07/22/2018   Procedure: ILEOSCOPY THROUGH STOMA;  Surgeon: Virgel Manifold, MD;  Location: [facility] ENDOSCOPY;  Service: Endoscopy;  Laterality: N/A;   ILEOSTOMY     ILEOSTOMY N/A 09/08/2018   Procedure: ILEOSTOMY REVISION POSSIBLE CREATION;  Surgeon: Herbert Pun, MD;  Location: [facility] ORS;  Service: General;  Laterality: N/A;   ILEOSTOMY CLOSURE N/A 08/15/2018   Procedure: DILATION OF ILEOSTOMY STRICTURE;  Surgeon: Herbert Pun, MD;  Location: [facility] ORS;  Service: General;  Laterality: N/A;   LAPAROTOMY Right 05/04/2018   Procedure: EXPLORATORY LAPAROTOMY right colectomy right and left ostomy;  Surgeon: Herbert Pun, MD;  Location: [facility] ORS;  Service: General;  Laterality: Right;   LEFT ATRIAL APPENDAGE OCCLUSION N/A 01/11/2021   Procedure: LEFT ATRIAL APPENDAGE OCCLUSION ([place]); ABORTED PROCEDURE WITHOUT DEVICE BEING IMPLANTED; Location: [facility]; Surgeon: Mylinda Latina, MD   LUNG BIOPSY     MASTECTOMY Left    2000, [place] Right    [place]   XI ROBOTIC ASSISTED COLOSTOMY TAKEDOWN N/A 10/23/2021   Procedure: XI ROBOTIC ASSISTED ILEOSTOMY TAKEDOWN;  Surgeon: Herbert Pun, MD;  Location: [facility] ORS;  Service: General;  Laterality: N/A;  180 minutes for the surgery part please     FAMILY HISTORY   Family History  Problem Relation Age of Onset   Breast cancer Mother 47   Cancer Mother        Breast    Cirrhosis Father    Breast  cancer Paternal Aunt 31   Cancer Maternal Aunt        Breast      SOCIAL HISTORY   Social History   Tobacco Use   Smoking status: Former    Packs/day: 0.50    Years: 20.00    Total pack years: 10.00    Types: Cigarettes    Quit date: 07/02/2012    Years since quitting: 10.0   Smokeless tobacco: Current    Types: Snuff  Vaping Use   Vaping Use: Never used  Substance Use Topics   Alcohol use: Yes    Alcohol/week: 3.0 standard drinks of alcohol    Types: 3 Cans of beer per week    Comment: Occasionally beer   Drug use: No     MEDICATIONS    Home  Medication:    Current Medication:  Current Facility-Administered Medications:    acetaminophen (TYLENOL) tablet 650 mg, 650 mg, Oral, Q6H PRN, 650 mg at 07/23/22 0855 **OR** acetaminophen (TYLENOL) suppository 650 mg, 650 mg, Rectal, Q6H PRN, Athena Masse, MD   albuterol (PROVENTIL) (2.5 MG/3ML) 0.083% nebulizer solution 2.5 mg, 2.5 mg, Inhalation, Q6H PRN, Judd Gaudier V, MD, 2.5 mg at 07/23/22 0334   amiodarone (PACERONE) tablet 200 mg, 200 mg, Oral, Daily, Judd Gaudier V, MD, 200 mg at 07/23/22 0859   apixaban (ELIQUIS) tablet 2.5 mg, 2.5 mg, Oral, BID, Judd Gaudier V, MD, 2.5 mg at 07/23/22 0856   atorvastatin (LIPITOR) tablet 40 mg, 40 mg, Oral, Daily, Judd Gaudier V, MD, 40 mg at 07/23/22 0856   calcium carbonate (TUMS - dosed in mg elemental calcium) chewable tablet 200 mg of elemental calcium, 1 tablet, Oral, TID PRN, Nicole Kindred A, DO, 200 mg of elemental calcium at 07/19/22 0930   citalopram (CELEXA) tablet 20 mg, 20 mg, Oral, Daily, Nicole Kindred A, DO, 20 mg at 07/23/22 0856   fluticasone furoate-vilanterol (BREO ELLIPTA) 100-25 MCG/ACT 1 puff, 1 puff, Inhalation, Daily, 1 puff at 07/23/22 0857 **AND** umeclidinium bromide (INCRUSE ELLIPTA) 62.5 MCG/ACT 1 puff, 1 puff, Inhalation, Daily, Judd Gaudier V, MD, 1 puff at 07/23/22 0857   HYDROcodone-acetaminophen (NORCO/VICODIN) 5-325 MG per tablet 1-2 tablet, 1-2 tablet, Oral, Q4H PRN, Athena Masse, MD, 2 tablet at 07/20/22 1004   metoprolol tartrate (LOPRESSOR) tablet 25 mg, 25 mg, Oral, BID, Nicole Kindred A, DO, 25 mg at 07/23/22 0856   midodrine (PROAMATINE) tablet 5 mg, 5 mg, Oral, TID WC, Griffith, Kelly A, DO, 5 mg at 07/23/22 0856   pantoprazole (PROTONIX) EC tablet 40 mg, 40 mg, Oral, BID AC, Judd Gaudier V, MD, 40 mg at 07/23/22 0856   polyethylene glycol (MIRALAX / GLYCOLAX) packet 17 g, 17 g, Oral, Daily, Breeze, Shantelle, NP, 17 g at 07/22/22 0904   simethicone (MYLICON) 40 EO/7.1QR suspension 40 mg, 40 mg,  Oral, QID PRN, Oswald Hillock, RPH, 40 mg at 07/21/22 2249   sodium bicarbonate tablet 1,300 mg, 1,300 mg, Oral, Daily, Judd Gaudier V, MD, 1,300 mg at 07/23/22 0856   sodium chloride flush (NS) 0.9 % injection 3 mL, 3 mL, Intravenous, Q12H, Griffith, Kelly A, DO, 3 mL at 07/23/22 0857   torsemide (DEMADEX) tablet 40 mg, 40 mg, Oral, QODAY, Griffith, Kelly A, DO, 40 mg at 07/23/22 0856    ALLERGIES   Patient has no known allergies.     REVIEW OF SYSTEMS    Review of Systems:  Gen:  Denies  fever, sweats, chills weigh loss  HEENT: Denies blurred  vision, double vision, ear pain, eye pain, hearing loss, nose bleeds, sore throat Cardiac:  No dizziness, chest pain or heaviness, chest tightness,edema Resp:   reports dyspnea chronically  Gi: Denies swallowing difficulty, stomach pain, nausea or vomiting, diarrhea, constipation, bowel incontinence Gu:  Denies bladder incontinence, burning urine Ext:   Denies Joint pain, stiffness or swelling Skin: Denies  skin rash, easy bruising or bleeding or hives Endoc:  Denies polyuria, polydipsia , polyphagia or weight change Psych:   Denies depression, insomnia or hallucinations   Other:  All other systems negative   VS: BP 116/83 (BP Location: Right Arm)   Pulse 64   Temp 97.9 F (36.6 C)   Resp (!) 22   Wt 54.4 kg   SpO2 98%   BMI 19.35 kg/m      PHYSICAL EXAM    GENERAL:NAD, no fevers, chills, no weakness no fatigue HEAD: Normocephalic, atraumatic.  EYES: Pupils equal, round, reactive to light. Extraocular muscles intact. No scleral icterus.  MOUTH: Moist mucosal membrane. Dentition intact. No abscess noted.  EAR, NOSE, THROAT: Clear without exudates. No external lesions.  NECK: Supple. No thyromegaly. No nodules. No JVD.  PULMONARY: decreased breath sounds with mild rhonchi worse at bases bilaterally.  CARDIOVASCULAR: S1 and S2. Regular rate and rhythm. No murmurs, rubs, or gallops. No edema. Pedal pulses 2+ bilaterally.   GASTROINTESTINAL: Soft, nontender, nondistended. No masses. Positive bowel sounds. No hepatosplenomegaly.  MUSCULOSKELETAL: No swelling, clubbing, or edema. Range of motion full in all extremities.  NEUROLOGIC: Cranial nerves II through XII are intact. No gross focal neurological deficits. Sensation intact. Reflexes intact.  SKIN: No ulceration, lesions, rashes, or cyanosis. Skin warm and dry. Turgor intact.  PSYCHIATRIC: Mood, affect within normal limits. The patient is awake, alert and oriented x 3. Insight, judgment intact.       IMAGING     ASSESSMENT/PLAN   Acute on chronic hypoxemic respiratory failure -patient with advanced COPD/CHF/CKD/lung cancer -she is with a least moderate severity physical deconditioning -all of these comorbidities are currently playing role in dyspnea -Advanced COPD - continue ICS/LABA/LAMA with Breo Ellipta and Incruse and addition of PRN albuterol nebulizer  -CHF/CKD overlap- adding aldactone 12.5 and reducing metoprolol from 25 to 12.5 bid to allow hemodynamic leveling with midodrine and Atrial fibrillation while diuresing 3. Atelectasis - worse at RLL - Metaneb with saline q8h 4. Right pleural effusion -  adding more diuresis, holding thoracentesis due to high likelyhood of rapid re-accumulation 5. Physical deconditioning- patient is currently with PT/OT and would benefit from same on outpatient 6. Atrial fibrillation - chronic stable - continue eliquis, metoprolol, amiodarone          Thank you for allowing me to participate in the care of this patient.   Patient/Family are satisfied with care plan and all questions have been answered.    Provider disclosure: Patient with at least one acute or chronic illness or injury that poses a threat to life or bodily function and is being managed actively during this encounter.  All of the below services have been performed independently by signing provider:  review of prior documentation from internal  and or external health records.  Review of previous and current lab results.  Interview and comprehensive assessment during patient visit today. Review of current and previous chest radiographs/CT scans. Discussion of management and test interpretation with health care team and patient/family.   This document was prepared using Dragon voice recognition software and may include unintentional dictation errors.  Ottie Glazier, M.D.  Division of Pulmonary & Critical Care Medicine

## 2022-07-23 NOTE — Progress Notes (Signed)
PT Cancellation Note  Patient Details Name: Danielle [name] MRN: [id number] DOB: [birth year]   Cancelled Treatment:    Reason Eval/Treat Not Completed: Other (comment) (Patient declined due to feeling shortness of breath with exertion. She reports being up multiple times today already. PT will continue with attempts as appropriate.)  Minna Merritts, PT, MPT  Percell Locus 07/23/2022, 2:30 PM

## 2022-07-23 NOTE — Care Management Important Message (Signed)
Important Message  Patient Details  Name: Danielle [name] MRN: [id number] Date of Birth: 1956-07-05   Medicare Important Message Given:  Yes     [name] 07/23/2022, 11:45 AM

## 2022-07-23 NOTE — Consult Note (Addendum)
Consultation Note Date: 07/24/2022   Patient Name: Danielle [name]  DOB: 1956/03/19  MRN: [id number]  Age / Sex: 66 y.o., female  PCP: [name], [name] Referring Physician: Ezekiel Slocumb, DO  Reason for Consultation: Establishing goals of care  HPI/Patient Profile: This is a very pleasant 66yo F with hx of advanced COPD, chronic anemia, paroxysmal AFchronic anticoagulation therapy, history of chronic systolic heart failure, history of adenocarcinoma of the right lung status post radiation therapy, history of stage IIIb chronic kidney disease, aortic valve stenosis, COPD with chronic respiratory failure on 4 L of oxygen continuous, history of left breast cancer status post mastectomy, history of esophageal cancer recurrent right-sided pleural effusion requiring thoracentesis Most recently on 06/19/2022 who presents to the ER for evaluation of worsening shortness of breath from her baseline.  She has associated chest tightness. She denies having mucus on expectoration, she is on 3-4L/min during my evaluation and notes when attempting to ambulate she has dyspnea.   Clinical Assessment and Goals of Care:  Notes and labs reviewed.  Spoke with attending regarding patient status.  In to see patient, patient is lying in bed on her side.  She states that she is able to breathe pretty freely if she lies on her left side but has shortness of breath with laying on her back or on her right.  No family at bedside at this time.  Patient states her son and her granddaughter live with her.  She states she has some shortness of breath at baseline, but this has been worse over the past month.  She states at baseline she does the cooking and cleaning for her family.  She states she has difficulty breathing with going from room to room.  Currently during this hospitalization, she is having difficulty getting from the bed to the  bathroom.  We discussed her diagnoses in great detail, prognosis, GOC, EOL wishes disposition and options.  Created space and opportunity for patient  to explore thoughts and feelings regarding current medical information.   A detailed discussion was had today regarding advanced directives.  Concepts specific to code status, artifical feeding and hydration, IV antibiotics and rehospitalization were discussed.  The difference between an aggressive medical intervention path and a comfort care path was discussed.  Values and goals of care important to patient and family were attempted to be elicited.  Discussed limitations of medical interventions and occasions to prolong quality of life in some situations and discussed the concept of human mortality.  In addition to her other diagnoses we discussed cardiorenal syndrome that using medications to try to correct one problem can create issues or worsening issues with other systems.  We discussed her esophageal dysmotility.  In regards to goals of care and quality of life, she states quality of life is not what is important and she wants any and all care possible for as long as possible, regardless of pain and suffering. Patient states she is a woman of faith and she is not afraid to die; she  further states she is not worried for her family after she leaves this earth. She tells me she just wants to continue care and all available options until God does not provide any further options.     She states that she wants PEG tube placement.  She also states that if needed in the future, she is amenable to dialysis.  She would like to continue thoracentesis procedures as needed.  She would want to be placed on the ventilator, and would want resuscitative efforts.  She does initially state that she would not want to live in a persistent vegetative state, but with discussion, she is unable to determine a timeline that this would be acceptable.   SUMMARY OF  RECOMMENDATIONS   Full code full scope.  Could consider a Pleurex catheter in the future for rapid reaccumulation of pleural effusions.  Recommend outpatient palliative if patient is amenable.   Prognosis:  Poor overall      Primary Diagnoses: Present on Admission:  Recurrent right pleural effusion  Acute on chronic combined systolic and diastolic CHF (congestive heart failure) (HCC)  Acute on chronic respiratory failure with hypoxia (HCC)  Paroxysmal atrial fibrillation (HCC)  Anemia of chronic disease  Chronic obstructive pulmonary disease (HCC)  Malignant neoplasm of upper lobe of right lung  Prolonged QT interval  CKD (chronic kidney disease), stage IV (HCC)  HLD (hyperlipidemia)  Depression  Essential hypertension  Acute kidney injury superimposed on CKD ([place])  Abdominal pain  Hypotension  Esophageal dysmotility  Hypomagnesemia   I have reviewed the medical record, interviewed the patient and family, and examined the patient. The following aspects are pertinent.  Past Medical History:  Diagnosis Date   Acute respiratory failure due to COVID-19 ([place]) 09/27/2019   Anemia    Anxiety    Aortic atherosclerosis ([place])    Aortic valve stenosis 02/10/2018   a.) TTE 02/10/2018: EF 55-60%: mild AS with MPG of 12 mmHg. b.) TTE 04/21/2018: EF 35-40%; mild AS with MPG 8 mmHg. c.) TTE 11/07/2019: EF 50-55%; mild AS with MPG 10 mmHg.   Arthritis    Atrial fibrillation (HCC)    a.) CHA2DS2-VASc = 4 (age, sex, HTN, aortic plaque). b.) Rate/rhythm maintained on oral carvedilol; chronically anticoagulated on dose reduced apixaban. c.)  Attempted deployment of LAA occlusive device on 01/11/2021; parameters failed and procedure aborted.   Breast cancer, left ([place]) 2000   a.) T2N1M0; ER/PR (+) --> Tx'd with total mastectomy, LN resection, XRT, and chemotherapy   Cancer of right lung ([place]) 07/30/2016   a.) adenocarcinoma; ALK, ROS1, PDL1, BRAF, EGFR all negative.   CHF (congestive  heart failure) (HCC)    Chronic diastolic CHF (congestive heart failure) ([place]) 11/26/2021   CKD (chronic kidney disease), stage IV (HCC)    COPD (chronic obstructive pulmonary disease) (HCC)    Dependence on supplemental oxygen    Depression    Diastolic dysfunction 62/83/6629   a.) TTE 02/10/2017: EF 55-60%; G2DD. b.) TTE 04/21/2018: EF 35-40%; mild LA dilation; mod MV regurgitation. c.) TTE 11/07/2019: EF 50-55%; G1DD.   DOE (dyspnea on exertion)    GERD (gastroesophageal reflux disease)    Heart murmur    History of 2019 novel coronavirus disease (COVID-19) 10/14/2019   HLD (hyperlipidemia)    Hypertension    Long term current use of anticoagulant    a.) apixaban   Lymphedema    Personal history of chemotherapy    Personal history of radiation therapy    Respiratory tract infection due  to COVID-19 virus 09/27/2019   SBO (small bowel obstruction) ([place]) 11/05/2020   Vitamin D deficiency    Social History   Socioeconomic History   Marital status: Divorced    Spouse name: Not on file   Number of children: 3   Years of education: Not on file   Highest education level: Not on file  Occupational History   Occupation: Retired   Tobacco Use   Smoking status: Former    Packs/day: 0.50    Years: 20.00    Total pack years: 10.00    Types: Cigarettes    Quit date: 07/02/2012    Years since quitting: 10.0   Smokeless tobacco: Current    Types: Snuff  Vaping Use   Vaping Use: Never used  Substance and Sexual Activity   Alcohol use: Yes    Alcohol/week: 3.0 standard drinks of alcohol    Types: 3 Cans of beer per week    Comment: Occasionally beer   Drug use: No   Sexual activity: Not Currently  Other Topics Concern   Not on file  Social History Narrative   Not on file   Social Determinants of Health   Financial Resource Strain: High Risk (07/05/2018)   Overall Financial Resource Strain (CARDIA)    Difficulty of Paying Living Expenses: Hard  Food Insecurity: No Food  Insecurity (07/19/2022)   Hunger Vital Sign    Worried About Running Out of Food in the Last Year: Never true    Ran Out of Food in the Last Year: Never true  Transportation Needs: No Transportation Needs (07/19/2022)   PRAPARE - Hydrologist (Medical): No    Lack of Transportation (Non-Medical): No  Physical Activity: Insufficiently Active (07/05/2018)   Exercise Vital Sign    Days of Exercise per Week: 2 days    Minutes of Exercise per Session: 30 min  Stress: No Stress Concern Present (07/05/2018)   [place]    Feeling of Stress : Not at all  Social Connections: Somewhat Isolated (07/05/2018)   Social Connection and Isolation Panel [NHANES]    Frequency of Communication with Friends and Family: More than three times a week    Frequency of Social Gatherings with Friends and Family: More than three times a week    Attends Religious Services: 1 to 4 times per year    Active Member of [employer] or Organizations: No    Attends Archivist Meetings: Never    Marital Status: Divorced   Family History  Problem Relation Age of Onset   Breast cancer Mother 41   Cancer Mother        Breast    Cirrhosis Father    Breast cancer Paternal Aunt 21   Cancer Maternal Aunt        Breast    Scheduled Meds:  amiodarone  200 mg Oral Daily   apixaban  2.5 mg Oral BID   atorvastatin  40 mg Oral Daily   citalopram  20 mg Oral Daily   feeding supplement (NEPRO CARB STEADY)  237 mL Oral TID BM   fluticasone furoate-vilanterol  1 puff Inhalation Daily   And   umeclidinium bromide  1 puff Inhalation Daily   metoprolol tartrate  25 mg Oral BID   midodrine  5 mg Oral TID WC   pantoprazole  40 mg Oral BID AC   polyethylene glycol  17 g Oral Daily   sodium  bicarbonate  1,300 mg Oral Daily   sodium chloride flush  3 mL Intravenous Q12H   torsemide  40 mg Oral QODAY   Continuous Infusions: PRN  Meds:.acetaminophen **OR** acetaminophen, albuterol, calcium carbonate, HYDROcodone-acetaminophen, simethicone Medications Prior to Admission:  Prior to Admission medications   Medication Sig Start Date End Date Taking? Authorizing Provider  acetaminophen (TYLENOL) 325 MG tablet Take 2 tablets (650 mg total) by mouth every 6 (six) hours as needed for mild pain (or Fever >/= 101). Patient taking differently: Take 650 mg by mouth every 4 (four) hours as needed for moderate pain. 12/24/18  Yes Gouru, Illene Silver, MD  albuterol (PROVENTIL HFA;VENTOLIN HFA) 108 (90 Base) MCG/ACT inhaler Inhale 2 puffs into the lungs every 6 (six) hours as needed for wheezing or shortness of breath. 04/24/18  Yes Demetrios Loll, MD  albuterol (PROVENTIL) (2.5 MG/3ML) 0.083% nebulizer solution Take 2.5 mg by nebulization daily as needed. 05/02/22  Yes [provider]  amiodarone (PACERONE) 200 MG tablet Take 1 tablet (200 mg total) by mouth daily. 06/21/22  Yes Lorella Nimrod, MD  apixaban (ELIQUIS) 2.5 MG TABS tablet Take 1 tablet (2.5 mg total) by mouth 2 (two) times daily. 05/26/22  Yes Wyvonnia Dusky, MD  atorvastatin (LIPITOR) 40 MG tablet Take 1 tablet (40 mg total) by mouth daily. 02/03/22  Yes Nicole Kindred A, DO  calcium carbonate (TUMS - DOSED IN MG ELEMENTAL CALCIUM) 500 MG chewable tablet Chew 0.5 tablets by mouth daily as needed.   Yes [provider]  calcium-vitamin D (OSCAL WITH D) 500-5 MG-MCG tablet Take 1 tablet by mouth daily.   Yes [provider]  citalopram (CELEXA) 20 MG tablet Take 1 tablet (20 mg total) by mouth daily. 02/04/22  Yes Nicole Kindred A, DO  ferrous sulfate 325 (65 FE) MG tablet Take 325 mg by mouth 2 (two) times daily with a meal.   Yes [provider]  Fluticasone-Umeclidin-Vilant (TRELEGY ELLIPTA) 100-62.5-25 MCG/ACT AEPB Inhale 1 puff into the lungs daily. 03/05/22  Yes [provider]  hydrALAZINE (APRESOLINE) 10 MG tablet Take 1 tablet (10 mg  total) by mouth 3 (three) times daily. 05/26/22  Yes Wyvonnia Dusky, MD  isosorbide dinitrate (ISORDIL) 5 MG tablet Take 1 tablet (5 mg total) by mouth 3 (three) times daily. 05/26/22  Yes Wyvonnia Dusky, MD  loperamide (IMODIUM) 2 MG capsule Take 2 mg by mouth daily as needed for diarrhea or loose stools.   Yes [provider]  metoprolol tartrate (LOPRESSOR) 25 MG tablet Take 1 tablet (25 mg total) by mouth 2 (two) times daily. Patient taking differently: Take 50 mg by mouth 2 (two) times daily. 02/03/22  Yes Ezekiel Slocumb, DO  Multiple Vitamin (MULTIVITAMIN WITH MINERALS) TABS tablet Take 1 tablet by mouth daily.   Yes [provider]  pantoprazole (PROTONIX) 40 MG tablet Take 1 tablet (40 mg total) by mouth 2 (two) times daily before a meal. 08/05/20  Yes Pokhrel, Laxman, MD  potassium chloride SA (KLOR-CON M) 20 MEQ tablet Take 2 tablets (40 mEq total) by mouth daily. Patient taking differently: Take 20 mEq by mouth 2 (two) times daily. 06/26/22  Yes Darylene Price A, FNP  predniSONE (DELTASONE) 5 MG tablet Take 5 mg by mouth daily with breakfast. 07/04/22  Yes [provider]  simethicone (MYLICON) 80 MG chewable tablet Chew 1 tablet (80 mg total) by mouth every 6 (six) hours as needed for flatulence. 06/21/22  Yes Lorella Nimrod, MD  sodium  bicarbonate 650 MG tablet Take 1,300 mg by mouth 3 (three) times daily.   Yes [provider]  Torsemide 40 MG TABS Take 20 mg by mouth daily. Hold this medication until you see your cardiologist Patient taking differently: Take 40 mg by mouth daily. 05/26/22  Yes Wyvonnia Dusky, MD  zolpidem (AMBIEN) 5 MG tablet Take 5 mg by mouth at bedtime as needed for sleep. 06/23/21  Yes [provider]  OXYGEN Inhale 4 L into the lungs daily.    [provider]  potassium chloride (KLOR-CON) 8 MEQ tablet Take by mouth. Patient not taking: Reported on 08/06/2022 07/05/22 07/05/23  [provider]   sucralfate (CARAFATE) 1 GM/10ML suspension Take 0.5 g by mouth 2 (two) times daily. Patient not taking: Reported on 08/10/2022    [provider]   No Known Allergies Review of Systems  Respiratory:  Positive for shortness of breath.     Physical Exam Pulmonary:     Effort: Pulmonary effort is normal.  Neurological:     Mental Status: She is alert.     Vital Signs: BP 121/79 (BP Location: Right Arm)   Pulse 61   Temp 98.4 F (36.9 C) (Oral)   Resp 17   Wt 53.2 kg   SpO2 100%   BMI 18.92 kg/m  Pain Scale: 0-10 POSS *See Group Information*: S-Acceptable,Sleep, easy to arouse Pain Score: 0-No pain   SpO2: SpO2: 100 % O2 Device:SpO2: 100 % O2 Flow Rate: .O2 Flow Rate (L/min): 4 L/min  IO: Intake/output summary:  Intake/Output Summary (Last 24 hours) at 07/24/2022 0859 Last data filed at 07/23/2022 2319 Gross per 24 hour  Intake 723 ml  Output --  Net 723 ml    LBM: Last BM Date : 07/23/22 Baseline Weight: Weight: 50.3 kg Most recent weight: Weight: 53.2 kg      Signed by: Asencion Gowda, NP   Please contact Palliative Medicine Team phone at [phone] for questions and concerns.  For individual provider: See Shea Evans

## 2022-07-24 DIAGNOSIS — I5043 Acute on chronic combined systolic (congestive) and diastolic (congestive) heart failure: Secondary | ICD-10-CM | POA: Diagnosis not present

## 2022-07-24 DIAGNOSIS — J90 Pleural effusion, not elsewhere classified: Secondary | ICD-10-CM | POA: Diagnosis not present

## 2022-07-24 DIAGNOSIS — K224 Dyskinesia of esophagus: Secondary | ICD-10-CM | POA: Diagnosis not present

## 2022-07-24 LAB — BASIC METABOLIC PANEL
Anion gap: 9 (ref 5–15)
BUN: 38 mg/dL — ABNORMAL HIGH (ref 8–23)
CO2: 26 mmol/L (ref 22–32)
Calcium: 9.1 mg/dL (ref 8.9–10.3)
Chloride: 106 mmol/L (ref 98–111)
Creatinine, Ser: 1.96 mg/dL — ABNORMAL HIGH (ref 0.44–1.00)
GFR, Estimated: 27 mL/min — ABNORMAL LOW (ref >60)
Glucose, Bld: 86 mg/dL (ref 70–99)
Potassium: 4.1 mmol/L (ref 3.5–5.1)
Sodium: 141 mmol/L (ref 135–145)

## 2022-07-24 LAB — MAGNESIUM: Magnesium: 1.7 mg/dL (ref 1.7–2.4)

## 2022-07-24 MED ORDER — ADULT MULTIVITAMIN W/MINERALS CH
1.0000 | ORAL_TABLET | Freq: Every day | ORAL | Status: DC
  Administered 2022-07-25 – 2022-08-24 (×31): 1 via ORAL
  Filled 2022-07-24 (×31): qty 1

## 2022-07-24 MED ORDER — SPIRONOLACTONE 12.5 MG HALF TABLET
12.5000 mg | ORAL_TABLET | Freq: Every day | ORAL | Status: DC
  Administered 2022-07-24 – 2022-07-25 (×2): 12.5 mg via ORAL
  Filled 2022-07-24 (×2): qty 1

## 2022-07-24 MED ORDER — METOPROLOL TARTRATE 25 MG PO TABS
12.5000 mg | ORAL_TABLET | Freq: Two times a day (BID) | ORAL | Status: DC
  Administered 2022-07-24 – 2022-07-26 (×5): 12.5 mg via ORAL
  Filled 2022-07-24 (×6): qty 1

## 2022-07-24 MED ORDER — BOOST / RESOURCE BREEZE PO LIQD CUSTOM
1.0000 | Freq: Three times a day (TID) | ORAL | Status: DC
  Administered 2022-07-25 – 2022-08-24 (×50): 1 via ORAL

## 2022-07-24 MED ORDER — MAGNESIUM SULFATE 2 GM/50ML IV SOLN
2.0000 g | Freq: Once | INTRAVENOUS | Status: AC
  Administered 2022-07-24: 2 g via INTRAVENOUS
  Filled 2022-07-24: qty 50

## 2022-07-24 NOTE — Progress Notes (Signed)
PULMONOLOGY         Date: 07/24/2022,   MRN# [id number] Danielle [name] 01-Jun-1956     AdmissionWeight: 50.3 kg                 CurrentWeight: 53.2 kg  Referring provider: Dr Arbutus Ped   CHIEF COMPLAINT:   Recurrent pleural effusions and advanced metastatic cancer.    HISTORY OF PRESENT ILLNESS   This is a very pleasant 66yo F with hx of advanced COPD, chronic anemia, paroxysmal AFchronic anticoagulation therapy, history of chronic systolic heart failure, history of adenocarcinoma of the right lung status post radiation therapy, history of stage IIIb chronic kidney disease, aortic valve stenosis, COPD with chronic respiratory failure on 4 L of oxygen continuous, history of left breast cancer status post mastectomy, history of esophageal cancer recurrent right-sided pleural effusion requiring thoracentesis Most recently on 06/19/2022 who presents to the ER for evaluation of worsening shortness of breath from her baseline.  She has associated chest tightness. She denies having mucus on expectoration, she is on 3-4L/min during my evaluation and notes when attempting to ambulate she has dyspnea.   07/24/22- patient is mildly imprved with bronchopulmonary hygiene .  Continue current care and may initiate DC planning.   PAST MEDICAL HISTORY   Past Medical History:  Diagnosis Date   Acute respiratory failure due to COVID-19 ([place]) 09/27/2019   Anemia    Anxiety    Aortic atherosclerosis ([place])    Aortic valve stenosis 02/10/2018   a.) TTE 02/10/2018: EF 55-60%: mild AS with MPG of 12 mmHg. b.) TTE 04/21/2018: EF 35-40%; mild AS with MPG 8 mmHg. c.) TTE 11/07/2019: EF 50-55%; mild AS with MPG 10 mmHg.   Arthritis    Atrial fibrillation (HCC)    a.) CHA2DS2-VASc = 4 (age, sex, HTN, aortic plaque). b.) Rate/rhythm maintained on oral carvedilol; chronically anticoagulated on dose reduced apixaban. c.)  Attempted deployment of LAA occlusive device on 01/11/2021; parameters failed and  procedure aborted.   Breast cancer, left ([place]) 2000   a.) T2N1M0; ER/PR (+) --> Tx'd with total mastectomy, LN resection, XRT, and chemotherapy   Cancer of right lung ([place]) 07/30/2016   a.) adenocarcinoma; ALK, ROS1, PDL1, BRAF, EGFR all negative.   CHF (congestive heart failure) (HCC)    Chronic diastolic CHF (congestive heart failure) ([place]) 11/26/2021   CKD (chronic kidney disease), stage IV (HCC)    COPD (chronic obstructive pulmonary disease) (HCC)    Dependence on supplemental oxygen    Depression    Diastolic dysfunction 25/02/3975   a.) TTE 02/10/2017: EF 55-60%; G2DD. b.) TTE 04/21/2018: EF 35-40%; mild LA dilation; mod MV regurgitation. c.) TTE 11/07/2019: EF 50-55%; G1DD.   DOE (dyspnea on exertion)    GERD (gastroesophageal reflux disease)    Heart murmur    History of 2019 novel coronavirus disease (COVID-19) 10/14/2019   HLD (hyperlipidemia)    Hypertension    Long term current use of anticoagulant    a.) apixaban   Lymphedema    Personal history of chemotherapy    Personal history of radiation therapy    Respiratory tract infection due to COVID-19 virus 09/27/2019   SBO (small bowel obstruction) ([place]) 11/05/2020   Vitamin D deficiency      SURGICAL HISTORY   Past Surgical History:  Procedure Laterality Date   Breast Biospy Left    [facility]   BREAST SURGERY     CARDIOVERSION N/A 02/23/2022   Procedure: CARDIOVERSION;  Surgeon: [name],  Quentin Cornwall, MD;  Location: [facility] ORS;  Service: Cardiovascular;  Laterality: N/A;   COLONOSCOPY N/A 04/30/2018   Procedure: COLONOSCOPY;  Surgeon: Pasty Spillers, MD;  Location: [facility] ENDOSCOPY;  Service: Endoscopy;  Laterality: N/A;   COLONOSCOPY N/A 07/22/2018   Procedure: COLONOSCOPY;  Surgeon: Pasty Spillers, MD;  Location: [facility] ENDOSCOPY;  Service: Endoscopy;  Laterality: N/A;   COLONOSCOPY WITH PROPOFOL N/A 09/21/2021   Procedure: COLONOSCOPY WITH PROPOFOL;  Surgeon: Sung Amabile, DO;  Location: [facility] ENDOSCOPY;   Service: General;  Laterality: N/A;   COLONOSCOPY WITH PROPOFOL N/A 03/11/2022   Procedure: COLONOSCOPY WITH PROPOFOL;  Surgeon: Jaynie Collins, DO;  Location: [facility] ENDOSCOPY;  Service: Gastroenterology;  Laterality: N/A;   DILATION AND CURETTAGE OF UTERUS     ELECTROMAGNETIC NAVIGATION BROCHOSCOPY Right 04/11/2016   Procedure: ELECTROMAGNETIC NAVIGATION BRONCHOSCOPY;  Surgeon: Stephanie Acre, MD;  Location: [facility] ORS;  Service: Cardiopulmonary;  Laterality: Right;   ESOPHAGOGASTRODUODENOSCOPY N/A 07/22/2018   Procedure: ESOPHAGOGASTRODUODENOSCOPY (EGD);  Surgeon: Pasty Spillers, MD;  Location[facility] ENDOSCOPY;  Service: Endoscopy;  Laterality: N/A;   ESOPHAGOGASTRODUODENOSCOPY (EGD) WITH PROPOFOL N/A 05/07/2018   Procedure: ESOPHAGOGASTRODUODENOSCOPY (EGD) WITH PROPOFOL;  Surgeon: Midge Minium, MD;  Location: [facility] ENDOSCOPY;  Service: Endoscopy;  Laterality: N/A;   ESOPHAGOGASTRODUODENOSCOPY (EGD) WITH PROPOFOL N/A 04/24/2019   Procedure: ESOPHAGOGASTRODUODENOSCOPY (EGD) WITH PROPOFOL;  Surgeon: Wyline Mood, MD;  Location: [facility] ENDOSCOPY;  Service: Gastroenterology;  Laterality: N/A;   ESOPHAGOGASTRODUODENOSCOPY (EGD) WITH PROPOFOL N/A 01/12/2020   Procedure: ESOPHAGOGASTRODUODENOSCOPY (EGD) WITH PROPOFOL;  Surgeon: Wyline Mood, MD;  Location: [facility] ENDOSCOPY;  Service: Gastroenterology;  Laterality: N/A;   ESOPHAGOGASTRODUODENOSCOPY (EGD) WITH PROPOFOL N/A 04/28/2020   Procedure: ESOPHAGOGASTRODUODENOSCOPY (EGD) WITH PROPOFOL;  Surgeon: Wyline Mood, MD;  Location: [facility] ENDOSCOPY;  Service: Gastroenterology;  Laterality: N/A;   ESOPHAGOGASTRODUODENOSCOPY (EGD) WITH PROPOFOL N/A 03/11/2022   Procedure: ESOPHAGOGASTRODUODENOSCOPY (EGD) WITH PROPOFOL;  Surgeon: Jaynie Collins, DO;  Location: [facility] ENDOSCOPY;  Service: Gastroenterology;  Laterality: N/A;   EUS N/A 05/07/2019   Procedure: FULL UPPER ENDOSCOPIC ULTRASOUND (EUS) RADIAL;  Surgeon: Rayann Heman, MD;  Location: [facility] ENDOSCOPY;   Service: Endoscopy;  Laterality: N/A;   ILEOSCOPY N/A 07/22/2018   Procedure: ILEOSCOPY THROUGH STOMA;  Surgeon: Pasty Spillers, MD;  Location: [facility] ENDOSCOPY;  Service: Endoscopy;  Laterality: N/A;   ILEOSTOMY     ILEOSTOMY N/A 09/08/2018   Procedure: ILEOSTOMY REVISION POSSIBLE CREATION;  Surgeon: Carolan Shiver, MD;  Location: [facility] ORS;  Service: General;  Laterality: N/A;   ILEOSTOMY CLOSURE N/A 08/15/2018   Procedure: DILATION OF ILEOSTOMY STRICTURE;  Surgeon: Carolan Shiver, MD;  Location: [facility] ORS;  Service: General;  Laterality: N/A;   LAPAROTOMY Right 05/04/2018   Procedure: EXPLORATORY LAPAROTOMY right colectomy right and left ostomy;  Surgeon: Carolan Shiver, MD;  Location: [facility] ORS;  Service: General;  Laterality: Right;   LEFT ATRIAL APPENDAGE OCCLUSION N/A 01/11/2021   Procedure: LEFT ATRIAL APPENDAGE OCCLUSION (WATCHMAN's DEVICE); ABORTED PROCEDURE WITHOUT DEVICE BEING IMPLANTED; Location: [facility]; Surgeon: Gerre Pebbles, MD   LUNG BIOPSY     MASTECTOMY Left    2000, [facility]   ROTATOR CUFF REPAIR Right    [facility]   XI ROBOTIC ASSISTED COLOSTOMY TAKEDOWN N/A 10/23/2021   Procedure: XI ROBOTIC ASSISTED ILEOSTOMY TAKEDOWN;  Surgeon: Carolan Shiver, MD;  Location: [facility] ORS;  Service: General;  Laterality: N/A;  180 minutes for the surgery part please     FAMILY HISTORY   Family History  Problem Relation Age of Onset   Breast cancer Mother 51  Cancer Mother        Breast    Cirrhosis Father    Breast cancer Paternal Aunt 43   Cancer Maternal Aunt        Breast      SOCIAL HISTORY   Social History   Tobacco Use   Smoking status: Former    Packs/day: 0.50    Years: 20.00    Total pack years: 10.00    Types: Cigarettes    Quit date: 07/02/2012    Years since quitting: 10.0   Smokeless tobacco: Current    Types: Snuff  Vaping Use   Vaping Use: Never used  Substance Use Topics   Alcohol use: Yes    Alcohol/week: 3.0 standard drinks of  alcohol    Types: 3 Cans of beer per week    Comment: Occasionally beer   Drug use: No     MEDICATIONS    Home Medication:    Current Medication:  Current Facility-Administered Medications:    acetaminophen (TYLENOL) tablet 650 mg, 650 mg, Oral, Q6H PRN, 650 mg at 07/23/22 2319 **OR** acetaminophen (TYLENOL) suppository 650 mg, 650 mg, Rectal, Q6H PRN, Athena Masse, MD   albuterol (PROVENTIL) (2.5 MG/3ML) 0.083% nebulizer solution 2.5 mg, 2.5 mg, Inhalation, Q6H PRN, Athena Masse, MD, 2.5 mg at 07/24/22 0145   amiodarone (PACERONE) tablet 200 mg, 200 mg, Oral, Daily, Judd Gaudier V, MD, 200 mg at 07/23/22 0859   apixaban (ELIQUIS) tablet 2.5 mg, 2.5 mg, Oral, BID, Judd Gaudier V, MD, 2.5 mg at 07/23/22 2057   atorvastatin (LIPITOR) tablet 40 mg, 40 mg, Oral, Daily, Athena Masse, MD, 40 mg at 07/23/22 0856   calcium carbonate (TUMS - dosed in mg elemental calcium) chewable tablet 200 mg of elemental calcium, 1 tablet, Oral, TID PRN, Nicole Kindred A, DO, 200 mg of elemental calcium at 07/19/22 0930   citalopram (CELEXA) tablet 20 mg, 20 mg, Oral, Daily, Nicole Kindred A, DO, 20 mg at 07/23/22 0856   feeding supplement (NEPRO CARB STEADY) liquid 237 mL, 237 mL, Oral, TID BM, Arbutus Ped, Kelly A, DO   fluticasone furoate-vilanterol (BREO ELLIPTA) 100-25 MCG/ACT 1 puff, 1 puff, Inhalation, Daily, 1 puff at 07/23/22 0857 **AND** umeclidinium bromide (INCRUSE ELLIPTA) 62.5 MCG/ACT 1 puff, 1 puff, Inhalation, Daily, Judd Gaudier V, MD, 1 puff at 07/23/22 0857   HYDROcodone-acetaminophen (NORCO/VICODIN) 5-325 MG per tablet 1-2 tablet, 1-2 tablet, Oral, Q4H PRN, Athena Masse, MD, 2 tablet at 07/20/22 1004   metoprolol tartrate (LOPRESSOR) tablet 12.5 mg, 12.5 mg, Oral, BID, Lanney Gins, Knoah Nedeau, MD   midodrine (PROAMATINE) tablet 5 mg, 5 mg, Oral, TID WC, Griffith, Kelly A, DO, 5 mg at 07/23/22 1642   pantoprazole (PROTONIX) EC tablet 40 mg, 40 mg, Oral, BID AC, Judd Gaudier V, MD, 40 mg  at 07/23/22 1642   polyethylene glycol (MIRALAX / GLYCOLAX) packet 17 g, 17 g, Oral, Daily, Breeze, Shantelle, NP, 17 g at 07/22/22 0904   simethicone (MYLICON) 40 BH/4.1PF suspension 40 mg, 40 mg, Oral, QID PRN, Oswald Hillock, RPH, 40 mg at 07/21/22 2249   sodium bicarbonate tablet 1,300 mg, 1,300 mg, Oral, Daily, Judd Gaudier V, MD, 1,300 mg at 07/23/22 0856   sodium chloride flush (NS) 0.9 % injection 3 mL, 3 mL, Intravenous, Q12H, Nicole Kindred A, DO, 3 mL at 07/23/22 2319   spironolactone (ALDACTONE) tablet 12.5 mg, 12.5 mg, Oral, Daily, Morgen Linebaugh, MD   torsemide (DEMADEX) tablet 40 mg, 40 mg, Oral, Luvenia Starch,  Kelly A, DO, 40 mg at 07/23/22 0856    ALLERGIES   Patient has no known allergies.     REVIEW OF SYSTEMS    Review of Systems:  Gen:  Denies  fever, sweats, chills weigh loss  HEENT: Denies blurred vision, double vision, ear pain, eye pain, hearing loss, nose bleeds, sore throat Cardiac:  No dizziness, chest pain or heaviness, chest tightness,edema Resp:   reports dyspnea chronically  Gi: Denies swallowing difficulty, stomach pain, nausea or vomiting, diarrhea, constipation, bowel incontinence Gu:  Denies bladder incontinence, burning urine Ext:   Denies Joint pain, stiffness or swelling Skin: Denies  skin rash, easy bruising or bleeding or hives Endoc:  Denies polyuria, polydipsia , polyphagia or weight change Psych:   Denies depression, insomnia or hallucinations   Other:  All other systems negative   VS: BP 121/79 (BP Location: Right Arm)   Pulse 61   Temp 98.4 F (36.9 C) (Oral)   Resp 17   Wt 53.2 kg   SpO2 100%   BMI 18.92 kg/m      PHYSICAL EXAM    GENERAL:NAD, no fevers, chills, no weakness no fatigue HEAD: Normocephalic, atraumatic.  EYES: Pupils equal, round, reactive to light. Extraocular muscles intact. No scleral icterus.  MOUTH: Moist mucosal membrane. Dentition intact. No abscess noted.  EAR, NOSE, THROAT: Clear without  exudates. No external lesions.  NECK: Supple. No thyromegaly. No nodules. No JVD.  PULMONARY: decreased breath sounds with mild rhonchi worse at bases bilaterally.  CARDIOVASCULAR: S1 and S2. Regular rate and rhythm. No murmurs, rubs, or gallops. No edema. Pedal pulses 2+ bilaterally.  GASTROINTESTINAL: Soft, nontender, nondistended. No masses. Positive bowel sounds. No hepatosplenomegaly.  MUSCULOSKELETAL: No swelling, clubbing, or edema. Range of motion full in all extremities.  NEUROLOGIC: Cranial nerves II through XII are intact. No gross focal neurological deficits. Sensation intact. Reflexes intact.  SKIN: No ulceration, lesions, rashes, or cyanosis. Skin warm and dry. Turgor intact.  PSYCHIATRIC: Mood, affect within normal limits. The patient is awake, alert and oriented x 3. Insight, judgment intact.       IMAGING     ASSESSMENT/PLAN   Acute on chronic hypoxemic respiratory failure -patient with advanced COPD/CHF/CKD/lung cancer -she is with a least moderate severity physical deconditioning -all of these comorbidities are currently playing role in dyspnea -Advanced COPD - continue ICS/LABA/LAMA with Breo Ellipta and Incruse and addition of PRN albuterol nebulizer  -CHF/CKD overlap- adding aldactone 12.5 and reducing metoprolol from 25 to 12.5 bid to allow hemodynamic leveling with midodrine and Atrial fibrillation while diuresing 3. Atelectasis - worse at RLL - Metaneb with saline q8h 4. Right pleural effusion -  adding more diuresis, holding thoracentesis due to high likelyhood of rapid re-accumulation 5. Physical deconditioning- patient is currently with PT/OT and would benefit from same on outpatient 6. Atrial fibrillation - chronic stable - continue eliquis, metoprolol, amiodarone          Thank you for allowing me to participate in the care of this patient.   Patient/Family are satisfied with care plan and all questions have been answered.    Provider  disclosure: Patient with at least one acute or chronic illness or injury that poses a threat to life or bodily function and is being managed actively during this encounter.  All of the below services have been performed independently by signing provider:  review of prior documentation from internal and or external health records.  Review of previous and current lab results.  Interview  and comprehensive assessment during patient visit today. Review of current and previous chest radiographs/CT scans. Discussion of management and test interpretation with health care team and patient/family.   This document was prepared using Dragon voice recognition software and may include unintentional dictation errors.     Ottie Glazier, M.D.  Division of Pulmonary & Critical Care Medicine

## 2022-07-24 NOTE — Progress Notes (Signed)
[facility]  ROUNDING NOTE   Subjective:   Danielle [name] 66 year old female with past medical conditions including diabetes, hypertension, lymphedema, breast cancer COPD and chronic kidney disease.  Patient presents to the emergency department of shortness of breath and has been admitted for Pleural effusion [J90] Status post thoracentesis [Z98.890] CHF exacerbation (HCC) [I50.9] Pleural effusion on right [J90] Acute on chronic combined systolic and diastolic CHF (congestive heart failure) (HCC) [I50.43] Acute on chronic respiratory failure with hypoxia ([place]) [J96.21]  Patient is known to our practice and is followed outpatient by Dr. Holley Raring.  She was last seen in office on April 19, 2022.    Update: Patient resting quietly Remains weak and short of breath on exertion Appetite poor  Creatinine 1.96   Objective:  Vital signs in last 24 hours:  Temp:  [97.9 F (36.6 C)-98.4 F (36.9 C)] 98.1 F (36.7 C) (10/10 1103) Pulse Rate:  [50-77] 66 (10/10 1103) Resp:  [16-20] 18 (10/10 1103) BP: (110-138)/(70-87) 117/70 (10/10 1103) SpO2:  [94 %-100 %] 94 % (10/10 1103) Weight:  [53.2 kg] 53.2 kg (10/10 0539)  Weight change: -1.225 kg Filed Weights   07/22/22 0427 07/23/22 0544 07/24/22 0539  Weight: 53.2 kg 54.4 kg 53.2 kg    Intake/Output: I/O last 3 completed shifts: In: 46 [P.O.:720; I.V.:6] Out: -    Intake/Output this shift:  No intake/output data recorded.  Physical Exam: General: NAD, resting comfortably  Head: Normocephalic, atraumatic. Moist oral mucosal membranes  Eyes: Anicteric  Lungs:  Clear to auscultation, normal effort, [place] O2  Heart: Irregular rate and rhythm  Abdomen:  Soft, tender, nondistended  Extremities: No peripheral edema.  Neurologic: Nonfocal, moving all four extremities  Skin: No lesions  Access: None    Basic Metabolic Panel: Recent Labs  Lab 07/20/22 0418 07/21/22 0632 07/22/22 0528 07/23/22 0507 07/24/22 0435  NA  141 136 140 139 141  K 3.8 3.7 4.1 4.5 4.1  CL 105 104 109 109 106  CO2 25 26 27 26 26   GLUCOSE 84 82 91 92 86  BUN 50* 47* 48* 34* 38*  CREATININE 2.77* 2.53* 2.25* 1.80* 1.96*  CALCIUM 8.9 8.6* 8.5* 8.6* 9.1  MG 1.7 1.6* 2.1  --  1.7     Liver Function Tests: Recent Labs  Lab 08/12/2022 2205  AST 24  ALT 18  ALKPHOS 55  BILITOT 0.8  PROT 6.5  ALBUMIN 3.5    No results for input(s): "LIPASE", "AMYLASE" in the last 168 hours. No results for input(s): "AMMONIA" in the last 168 hours.  CBC: Recent Labs  Lab 07/31/2022 1810 07/20/22 0418 07/21/22 0632  WBC 3.9* 3.6* 3.7*  NEUTROABS 3.1  --   --   HGB 9.7* 9.5* 9.2*  HCT 29.9* 28.7* 28.3*  MCV 96.8 94.1 94.0  PLT 189 195 177     Cardiac Enzymes: No results for input(s): "CKTOTAL", "CKMB", "CKMBINDEX", "TROPONINI" in the last 168 hours.  BNP: Invalid input(s): "POCBNP"  CBG: No results for input(s): "GLUCAP" in the last 168 hours.  Microbiology: Results for orders placed or performed during the hospital encounter of 08/11/2022  Resp Panel by RT-PCR (Flu A&B, Covid) Anterior Nasal Swab     Status: None   Collection Time: 07/21/2022  6:43 PM   Specimen: Anterior Nasal Swab  Result Value Ref Range Status   SARS Coronavirus 2 by RT PCR NEGATIVE NEGATIVE Final    Comment: (NOTE) SARS-CoV-2 target nucleic acids are NOT DETECTED.  The SARS-CoV-2 RNA  is generally detectable in upper respiratory specimens during the acute phase of infection. The lowest concentration of SARS-CoV-2 viral copies this assay can detect is 138 copies/mL. A negative result does not preclude SARS-Cov-2 infection and should not be used as the sole basis for treatment or other patient management decisions. A negative result may occur with  improper specimen collection/handling, submission of specimen other than nasopharyngeal swab, presence of viral mutation(s) within the areas targeted by this assay, and inadequate number of viral copies(<138  copies/mL). A negative result must be combined with clinical observations, patient history, and epidemiological information. The expected result is Negative.  Fact Sheet for Patients:  EntrepreneurPulse.com.au  Fact Sheet for Healthcare Providers:  IncredibleEmployment.be  This test is no t yet approved or cleared by the [place] FDA and  has been authorized for detection and/or diagnosis of SARS-CoV-2 by FDA under an Emergency Use Authorization (EUA). This EUA will remain  in effect (meaning this test can be used) for the duration of the COVID-19 declaration under Section 564(b)(1) of the Act, 21 U.S.C.section 360bbb-3(b)(1), unless the authorization is terminated  or revoked sooner.       Influenza A by PCR NEGATIVE NEGATIVE Final   Influenza B by PCR NEGATIVE NEGATIVE Final    Comment: (NOTE) The Xpert Xpress SARS-CoV-2/FLU/RSV plus assay is intended as an aid in the diagnosis of influenza from Nasopharyngeal swab specimens and should not be used as a sole basis for treatment. Nasal washings and aspirates are unacceptable for Xpert Xpress SARS-CoV-2/FLU/RSV testing.  Fact Sheet for Patients: EntrepreneurPulse.com.au  Fact Sheet for Healthcare Providers: IncredibleEmployment.be  This test is not yet approved or cleared by the [place] FDA and has been authorized for detection and/or diagnosis of SARS-CoV-2 by FDA under an Emergency Use Authorization (EUA). This EUA will remain in effect (meaning this test can be used) for the duration of the COVID-19 declaration under Section 564(b)(1) of the Act, 21 U.S.C. section 360bbb-3(b)(1), unless the authorization is terminated or revoked.  Performed at [facility] [address] [place], [place] [address]     Coagulation Studies: No results for input(s): "LABPROT", "INR" in the last 72 hours.  Urinalysis: No results for input(s):  "COLORURINE", "LABSPEC", "PHURINE", "GLUCOSEU", "HGBUR", "BILIRUBINUR", "KETONESUR", "PROTEINUR", "UROBILINOGEN", "NITRITE", "LEUKOCYTESUR" in the last 72 hours.  Invalid input(s): "APPERANCEUR"    Imaging: DG Chest Port 1 View  Result Date: 07/23/2022 CLINICAL DATA:  Pleural effusion. EXAM: PORTABLE CHEST 1 VIEW COMPARISON:  Chest radiograph 07/19/2022 FINDINGS: Multiple monitoring leads overlie the patient. Stable cardiomegaly. Aortic atherosclerosis. There appears to be worsening layering right pleural effusion with increased underlying opacities within the right lung. Similar-appearing heterogeneous opacities right upper lung. Similar appearance of the left lung. No pneumothorax. Thoracic spine degenerative changes. IMPRESSION: Worsening layering right pleural effusion with underlying opacities which may represent atelectasis or infection. Electronically Signed   By: Lovey Newcomer M.D.   On: 07/23/2022 11:39     Medications:     amiodarone  200 mg Oral Daily   apixaban  2.5 mg Oral BID   atorvastatin  40 mg Oral Daily   citalopram  20 mg Oral Daily   feeding supplement (NEPRO CARB STEADY)  237 mL Oral TID BM   fluticasone furoate-vilanterol  1 puff Inhalation Daily   And   umeclidinium bromide  1 puff Inhalation Daily   metoprolol tartrate  12.5 mg Oral BID   midodrine  5 mg Oral TID WC   pantoprazole  40 mg  Oral BID AC   polyethylene glycol  17 g Oral Daily   sodium bicarbonate  1,300 mg Oral Daily   sodium chloride flush  3 mL Intravenous Q12H   spironolactone  12.5 mg Oral Daily   torsemide  40 mg Oral QODAY   acetaminophen **OR** acetaminophen, albuterol, calcium carbonate, HYDROcodone-acetaminophen, simethicone  Assessment/ Plan:  Danielle [name] is a 66 y.o.  female with past medical conditions including diabetes, hypertension, lymphedema, breast cancer COPD and chronic kidney disease.  Patient presents to the emergency department of shortness of breath and has been  admitted for Pleural effusion [J90] Status post thoracentesis [Z98.890] CHF exacerbation ([place]) [I50.9] Pleural effusion on right [J90] Acute on chronic combined systolic and diastolic CHF (congestive heart failure) (HCC) [I50.43] Acute on chronic respiratory failure with hypoxia (HCC) [J96.21]   Acute Kidney Injury on chronic kidney disease stage IIIb with baseline creatinine 1.50 and GFR of 38 on 06/16/22.  Acute kidney injury secondary to cardiorenal syndrome and diuretic therapy Chronic kidney disease is secondary to hypertension and CHF.  No IV contrast exposure.    Creatinine slightly increased with Torsemide. Will continue to monitor.   Lab Results  Component Value Date   CREATININE 1.96 (H) 07/24/2022   CREATININE 1.80 (H) 07/23/2022   CREATININE 2.25 (H) 07/22/2022    Intake/Output Summary (Last 24 hours) at 07/24/2022 1243 Last data filed at 07/24/2022 0942 Gross per 24 hour  Intake 483 ml  Output 0 ml  Net 483 ml     2. Anemia of chronic kidney disease Lab Results  Component Value Date   HGB 9.2 (L) 07/21/2022    Hemoglobin within range  3.  Hypertension with chronic kidney disease.  Home regimen includes torsemide, amiodarone, hydralazine, and isosorbide. Continue Midodrine 5mg  three times daily for BP support.  Torsemide and spironolactone restarted today   4.  Pleural effusions, recurrent.  Seen on chest x-ray on admission.  Radiology performed right thoracentesis with 900 mL removed.   LOS: 6   10/10/202312:43 PM

## 2022-07-24 NOTE — Progress Notes (Addendum)
Initial Nutrition Assessment  DOCUMENTATION CODES:   Severe malnutrition in context of chronic illness  INTERVENTION:   Boost Breeze po TID, each supplement provides 250 kcal and 9 grams of protein  Magic cup TID with meals, each supplement provides 290 kcal and 9 grams of protein  MVI po daily   48 hour calorie count   Pt at high refeed risk; recommend monitor potassium, magnesium and phosphorus labs daily until stable  NUTRITION DIAGNOSIS:   Severe Malnutrition related to chronic illness as evidenced by severe fat depletion, severe muscle depletion.  GOAL:   Patient will meet greater than or equal to 90% of their needs  MONITOR:   PO intake, Supplement acceptance, Labs, Weight trends, Skin, I & O's  REASON FOR ASSESSMENT:   Consult Assessment of nutrition requirement/status  ASSESSMENT:   66 y.o. 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  Met with pt in room today. Pt is well known to this RD from numerous previous admissions. Pt with chronic severe malnutrition for several years after loosing a significant amount of weight when she had ischemic colitis requiring multiple surgeries in 2019. Pt was able to regain some weight and has remained fairly weight since then. Pt reports today that her appetite and oral intake is good but reports that food sometimes gets stuck in her chest area and then she  regurgitates it back up. Pt reports that this happens about twice a week and it doesn't matter the consistency of the food because it sometimes happens with just liquids. Pt reports that she is unable to tolerate milky supplements as they give her diarrhea and she reports that she can't afford other supplements at home. Pt is willing to try peach Boost Breeze. Pt reports that she is eating fairly well in hospital. Pt does report food getting stuck in her throat this morning but reports that is wasn't that bad and it finally passed. Pt reports h/o esophageal stricture that was dilated in the past as a result of scar tissue from radiation. Pt reports that she was told that she would not be able to have any further dilations. Pt was found here to have esophageal dysmotility.  MD concerned that pt may need IR G-tube placement r/t her dysphagia. MD is requesting a 48 hour calorie count to assess pt's oral intake. Pt was seen by palliative care and reports that she will get a feeding tube if she has too. Today, RD discussed feeding tube indications, placement, after care, tube feeds and quality of life with feeding tube with pt. Pt seems unsure that she needs a feeding tube as she reports that she is eating pretty well. Pt requested that RD reiterate this information to her daughter; RD did call and speak with [name], pt's daughter to discuss feeding tube as well. Pt's daughter will have a discussion with her mom to determine her goals of care going forward. In the mean time, will complete calorie count and provide nutritional recommendations. Pt meeting 83-92% of her estimated needs on day  one of calorie count. RD will add supplements and MVI. Pt is at refeed risk.   Medications reviewed and include: celexa, protonix, miralax, Na bicarbonate, aldactone, torsemide  Labs reviewed: K 4.1 wnl, BUN 38(H), creat 1.96(H), Mg 1.7 wnl BNP 2935(H)- 10/4 Wbc- 3.7(L), Hgb 9.2(L), Hct 28.3(L)  NUTRITION - FOCUSED PHYSICAL  EXAM:  Flowsheet Row Most Recent Value  Orbital Region Moderate depletion  Upper Arm Region Severe depletion  Thoracic and Lumbar Region Severe depletion  Buccal Region Moderate depletion  Temple Region Moderate depletion  Clavicle Bone Region Severe depletion  Clavicle and Acromion Bone Region Severe depletion  Scapular Bone Region Moderate depletion  Dorsal Hand Severe depletion  Patellar Region Severe depletion  Anterior Thigh Region Severe depletion  Posterior Calf Region Severe depletion  Edema (RD Assessment) None  Hair Reviewed  Eyes Reviewed  Mouth Reviewed  Skin Reviewed  Nails Reviewed   Diet Order:   Diet Order             Diet regular Room service appropriate? Yes; Fluid consistency: Thin; Fluid restriction: 1800 mL Fluid  Diet effective now                  EDUCATION NEEDS:   Education needs have been addressed  Skin:  Skin Assessment: Reviewed RN Assessment  Last BM:  10/10- type 5  Height:   Ht Readings from Last 1 Encounters:  07/24/22 $RemoveB'5\' 6"'nAATsoKW$  (1.676 m)    Weight:   Wt Readings from Last 1 Encounters:  07/24/22 53.2 kg    Ideal Body Weight:  59 kg  BMI:  Body mass index is 18.92 kg/m.  Estimated Nutritional Needs:   Kcal:  1400-1600kcal/day  Protein:  70-80g/day  Fluid:  1.3-1.5L/day  [name] MS, RD, LDN Please refer to [facility] for RD and/or RD on-call/weekend/after hours pager

## 2022-07-24 NOTE — Progress Notes (Signed)
Progress Note   Patient: Danielle [name] TWS:[id number] DOB: 06/26/1956 DOA: 07/24/2022     6 DOS: the patient was seen and examined on 07/24/2022   Brief hospital course: Danielle [name] is a 66 y.o. female with medical history significant for Paroxysmal A-fib on chronic anticoagulation therapy, history of chronic systolic heart failure, history of adenocarcinoma of the right lung status post radiation therapy, history of stage IIIb chronic kidney disease, aortic valve stenosis, COPD with chronic respiratory failure on 4 L of oxygen continuous, history of left breast cancer status post mastectomy, history of esophageal cancer recurrent right-sided pleural effusion requiring thoracentesis, most recently on 06/19/2022 who presented to the ER on 08/08/2022 for evaluation of worsening shortness of breath with associated chest tightness.    ED course and evaluation --- BP 135/99 with RR 29 with O2 sat 90% on increased O2 flow rate of 5 L/min.  Labs with troponin 15>>17, BNP 2935.  Treated with IV Lasix in the ED.  Chest x-ray showed -- Small right pleural effusion, increased since prior study.  Increasing right basilar consolidation, which may reflect airspace disease or progressive atelectasis.  Mass-like linear consolidation right suprahilar region, unchanged since recent CT.  Patient was admitted to hospitalist service and IV diuresis was continued.    IR was consulted for thoracentesis which was done on 10/5 with 900 cc fluid removed.   Hospital course complicated by: --Acute kidney injury after diuresis due to cardiorenal physiology.  Nephrology was consulted.   Renal function improved with diuretics held, but pt with persistent and worsening exertional dyspnea again. --Abdominal pain for which evaluation including abdominal xray and CT abdomen/pelvis was unremarkable. --Swallowing difficulties with severe esophageal dysmotility discovered on barium esophagram.      Assessment and Plan: *  Acute on chronic combined systolic and diastolic CHF (congestive heart failure) ([place]) Echo 01/30/22 -- EF 35-40% with regional WMA's, grade II diastolic dysfunction, moderate MR, mod-severe TR, moderate AS. Presented with recurrent pleural effusion and progressively worsening dyspnea. --IR for thoracentesis --Stopped IV Lasix given Cr rising --Resume Torsemide 40 mg every other day per nephrology --Repeated CXR 10/9 - showed worsening layering right pleural effusion again.  --Pulmonology consulted and started low dose Aldactone, reduced metop for BP to tolerate it, added metanebs for alveolar recruitment.  Recommended against repeat thoracentesis given likelihood for rapid re-accumulation --Strict I/O's, daily weights --Monitor renal function, electrolytes   Hypotension History of same intermittently and limiting GDMT, and in setting of diuresis.   No fever or leukocytosis, does have abdominal pain. --Reduced metoprolol dose --Hold off hydralazine, Isordil --Maintain MAP>65 --Started low dose midodrine, increased to 5 mg so cardiac meds and diuretic could be resumed. --Monitor BP closely   Acute on chronic respiratory failure with hypoxia (HCC) Baseline on 4 L/min continuous O2. O2 sats on admission were only 90% on 5 l/min in setting of decompensated CHF with recurrent pleural effusion. 10/6>>10: on baseline 4 L O2 but with ongoing severe DOE --Supplement O2 to keep sats 88-94% --Mgmt of underlying issues as outlined   Recurrent right pleural effusion Presented with shortness of breath, dyspnea on exertion with chest discomfort, desaturating to the low 80s on her baseline flow rate.  Chest xray with increasing right pleural effusion from prior, BNP over 2000.   Chart reviewed: prior thoracenteses done this year on 1/30, 2/8, 4/16, 5/12, 7/25, 8/8, 9/5 --IR was consulted on admission. --Underwent thoracentesis on 10/5 with 900 cc fluid removed. --Diuresis now held as  outlined --Monitor  respiratory status --Repeat CXR 10/9 shows worsening again --Pulmonology consulted, recommends against repeat thora. Added low dose aldactone for more diuresis and CHF benefit   Paroxysmal atrial fibrillation (HCC) Rates overall controlled. Reduced metoprolol 50 >> 12.5 mg BID due to lower BP's.  Had to completely hold metop for couple of days. BP's better on midodrine. --Resumed on metoprolol 12.5 mg PO BID with hold parameters for low BP --Telemetry --Continue amiodarone and Elqius  Acute kidney injury superimposed on CKD (HCC) Baseline CKD stage 3a, cr was stable on admission 1.84, but increased to 2.38 after IV diuresis. Cr trend: 2.77>>2.53>> 2.25 >> 1.80 (torsemide resume) >> 1.96 today. --Diuresis has been on hold --Nephrology is following --Continue torsemide 40 mg PO every other day --Monitor BMP  --Avoid nephrotoxins and renally dose meds  Essential hypertension Now off IV Lasix  Hold hydralazine, metprolol, Isordil for now (BP's soft even with these all being held) Hold torsemide (AKI), resume diuretics per nephrology. Started on low dose midodrine.  CKD (chronic kidney disease), stage IV ([place]) Developed AKI likely due to diuresis, cardiorenal physiology. Renal function improved with holding diuretics See AKI for plan. Nephrology following.  Esophageal dysmotility Pt reports difficulty with food and especially pills getting stuck in her esophagus and come back up at times, take a long time to pass into her stomach, causes a lot of discomfort.  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 --Reviewed prior EGD results from May this year, no mention of esophageal findings. --Esophagram 10/7 showed severe esophageal dysmotility, no evidence of obstruction or stenosis --48 hour calorie count is underway --Dietitian and SLP consults --Pt may require feeding tube and says she would want it, if needed for adequate nutrition --Palliative care  consulted  Prolonged QT interval QTc was 592 on EKG on admission.   Improved - repeat EKG 10/6 QTc 450 ms. --Serial EKG's to monitor --Keep K>4, Mg>2 --Telemetry monitoring  Malignant neoplasm of upper lobe of right lung Malignant neoplasm of lower third of esophagus Patient is status post radiation therapy for esophageal cancer S/p radiation therapy for lung cancer Follow-up with oncology  HLD (hyperlipidemia) On Lipitor  Abdominal pain Pt reported new onset RLQ abdominal pain today (10/6), radiating across abdomen to her left side.  It got a bit better after a BM, but persistent and severe at times. 10/9 - pt reports this is better --Abdominal xray 10/6 negative --CT abd/pelvis if xray 10/6 also no acute findings to explain --Trial of simethicone --Pain control PRN --Monitor closely  Anemia of chronic disease Hemoglobin at baseline  Hypomagnesemia Magnesium 2.1 from 1.6, replaced 10/7. Maintain Mg > 2.0 given history of QTc prolongation and A-fib. Monitor & replace PRN  Chronic obstructive pulmonary disease (HCC) Severe COPD with chronic respiratory failure on 4 L of continuous O2 at home. --Continue scheduled and PRN bronchodilators  --Continue inhaled steroids --Pulmonology following  Depression Continue Celexa 20 mg daily  Carcinoma of overlapping sites of left breast in female, estrogen receptor positive ([place]) Noted. No acute issues.        Subjective: Patient seen awake sitting up in bed.  She denies abdominal pain. Swallow issues are about the same.  Calories being counted today, not sure when counting was started.  She reports ongoing exertional dyspnea but this might be bit better today.     Physical Exam: Vitals:   07/24/22 1522 07/24/22 1645 07/24/22 1658 07/24/22 1700  BP:  103/75    Pulse:  62  Resp:  18    Temp:  97.6 F (36.4 C)    TempSrc:  Oral    SpO2: (!) 87% 98%  98%  Weight:      Height:   5\' 6"  (1.676 m)    General exam:  awake, alert, no acute distress HEENT: moist mucus membranes, hearing grossly normal  Respiratory system: normal respiratory effort at rest on 4 L/min [place] O2.  Diminished bases otherwise clear b/l Cardiovascular system: normal W3/S9,  RRR, systolic murmur noted, no peripheral edema Gastrointestinal system: abdomen is soft, non-tender, no guarding or rebound Central nervous system: A&O x3. no gross focal neurologic deficits, normal speech Extremities: moves all, no edema, normal tone Psychiatry: normal mood, congruent affect, judgement and insight appear normal   Data Reviewed:  Notable labs: Creatinine 2.38>>2.77>> 2.53>>2.25>>1.80 >> 1.96 , BUN 38    Family Communication: None at bedside. Pt reports she will update her daughter, declines for me to call her.  Consults: Palliative Care Pulmonology Nephrology   Disposition: Status is: Inpatient Remains inpatient appropriate because: Monitoring for further renal function improvement and BP with new medication changes as outlined.   Ongoing evaluation of difficulty swallowing with concern for inadequate PO intake including calorie count underway. Anticipate stable to d/c in 24-48 hrs after count complete.   Planned Discharge Destination: Home    Time spent: 45 minutes  Author: Ezekiel Slocumb, DO 07/24/2022 6:04 PM  For on call review [email or web address].

## 2022-07-24 NOTE — Progress Notes (Signed)
Calorie Count Day 1  Estimated Nutritional Needs:    Kcal:  1400-1600kcal/day Protein:  70-80g/day Fluid:  1.3-1.5L/day  Dinner 10/9: 100% tilapia, corn, collard greens and a roll   Total- 491kcal and 27g protein   Breakfast 10/10: 50% scrambled eggs, 50% [place] sausage, 100% pineapple, 100% milk   Total- 317kcal and 18g protein   Lunch 10/10: 100% vegetable soup, grilled cheese, white cake and peaches  Total- 481kcal and 13g protein   Total Intake:  1289kcal (92% estimated needs) and  58g protein (83% estimated needs)  Danielle Distance MS, RD, LDN Please refer to [facility] for RD and/or RD on-call/weekend/after hours pager

## 2022-07-24 NOTE — Progress Notes (Signed)
Physical Therapy Treatment Patient Details Name: Danielle [name] MRN: [id number] DOB: 10-Nov-1955 Today's Date: 07/24/2022   History of Present Illness Pt is a 66 year old female presented to the ER on 08/04/2022 for evaluation of worsening shortness of breath with associated chest tightness; PMH significant for Paroxysmal A-fib on chronic anticoagulation therapy, history of chronic systolic heart failure, history of adenocarcinoma of the right lung status post radiation therapy, history of stage IIIb chronic kidney disease, aortic valve stenosis, COPD with chronic respiratory failure on 4 L of oxygen continuous, history of left breast cancer status post mastectomy, history of esophageal cancer recurrent right-sided pleural effusion requiring thoracentesis    PT Comments    Pt in bed, still c/o HA, DOE reported as worse today, but pt agreeable to trial some AMB. 2 AMB intervals into hallway, very slowly as pt tries to not aggravates further dyspnea. HR in AF widely variable once up AMB. 4L flowrate on arrival, moved to 6L for AMB, still has some desaturation with short distance AMB. Pt drops to mid 80s on 4L just coming to EOB, and after AMB slowly on 6L. HA remains 8/10. No AD used for AMB, no LOB, albeit pt uses rail in hallway for support ad lib.     Recommendations for follow up therapy are one component of a multi-disciplinary discharge planning process, led by the attending physician.  Recommendations may be updated based on patient status, additional functional criteria and insurance authorization.  Follow Up Recommendations  Home health PT (pt not interest, feels more limited by dyspnea, not from weakness; Pulmonary recommending PT/OT at discharge)     Assistance Recommended at Discharge Set up Supervision/Assistance  Patient can return home with the following Assistance with cooking/housework;Assist for transportation;Help with stairs or ramp for entrance;A little help with  bathing/dressing/bathroom   Equipment Recommendations  None recommended by PT    Recommendations for Other Services       Precautions / Restrictions Precautions Precautions: Fall Restrictions Weight Bearing Restrictions: No     Mobility  Bed Mobility Overal bed mobility: Needs Assistance Bed Mobility: Supine to Sit     Supine to sit: Modified independent (Device/Increase time), HOB elevated Sit to supine: Modified independent (Device/Increase time), HOB elevated        Transfers Overall transfer level: Needs assistance Equipment used: Rolling walker (2 wheels) Transfers: Sit to/from Stand Sit to Stand: Modified independent (Device/Increase time)                Ambulation/Gait Ambulation/Gait assistance: Supervision Gait Distance (Feet): 60 Feet Assistive device: None Gait Pattern/deviations: Step-to pattern       General Gait Details: very slow and guarded, flow rate upped to 6L for increased demand. Desats to 87% after AMB with 2 minutes recovery on 6L   Stairs             Wheelchair Mobility    Modified Rankin (Stroke Patients Only)       Balance                                            Cognition Arousal/Alertness: Awake/alert Behavior During Therapy: WFL for tasks assessed/performed Overall Cognitive Status: Within Functional Limits for tasks assessed  Exercises Other Exercises Other Exercises: 2x94ft AMB with seated recovery between    General Comments        Pertinent Vitals/Pain Pain Assessment Pain Assessment: No/denies pain    Home Living                          Prior Function            PT Goals (current goals can now be found in the care plan section) Acute Rehab PT Goals Patient Stated Goal: improve breathing PT Goal Formulation: With patient Time For Goal Achievement: 08/04/22 Potential to Achieve Goals: Good Progress  towards PT goals: Progressing toward goals    Frequency    Min 2X/week      PT Plan Current plan remains appropriate    Co-evaluation              AM-PAC PT "6 Clicks" Mobility   Outcome Measure  Help needed turning from your back to your side while in a flat bed without using bedrails?: None Help needed moving from lying on your back to sitting on the side of a flat bed without using bedrails?: None Help needed moving to and from a bed to a chair (including a wheelchair)?: None Help needed standing up from a chair using your arms (e.g., wheelchair or bedside chair)?: None Help needed to walk in hospital room?: None Help needed climbing 3-5 steps with a railing? : A Little 6 Click Score: 23    End of Session Equipment Utilized During Treatment: Oxygen Activity Tolerance: Patient limited by fatigue Patient left: in bed;with call bell/phone within reach Nurse Communication: Mobility status PT Visit Diagnosis: Difficulty in walking, not elsewhere classified (R26.2);Dizziness and giddiness (R42)     Time: 8887-5797 PT Time Calculation (min) (ACUTE ONLY): 23 min  Charges:  $Therapeutic Exercise: 23-37 mins                    4:28 PM, 07/24/22 Danielle [name], PT, DPT Physical Therapist - [facility]  [phone] ([place] [facility])    Danielle [name] C 07/24/2022, 4:25 PM

## 2022-07-24 NOTE — Progress Notes (Signed)
Called by RN for said PT having SOB. PT was assessed and given PRN Albuterol Nebulizer. PT remains on 4L [place], reports mild improvement, and states SOB is her norm.

## 2022-07-25 ENCOUNTER — Ambulatory Visit: Payer: Medicare Other | Admitting: Family

## 2022-07-25 DIAGNOSIS — I5043 Acute on chronic combined systolic (congestive) and diastolic (congestive) heart failure: Secondary | ICD-10-CM | POA: Diagnosis not present

## 2022-07-25 LAB — BASIC METABOLIC PANEL
Anion gap: 10 (ref 5–15)
BUN: 42 mg/dL — ABNORMAL HIGH (ref 8–23)
CO2: 25 mmol/L (ref 22–32)
Calcium: 8.9 mg/dL (ref 8.9–10.3)
Chloride: 105 mmol/L (ref 98–111)
Creatinine, Ser: 2.27 mg/dL — ABNORMAL HIGH (ref 0.44–1.00)
GFR, Estimated: 23 mL/min — ABNORMAL LOW (ref >60)
Glucose, Bld: 93 mg/dL (ref 70–99)
Potassium: 4.2 mmol/L (ref 3.5–5.1)
Sodium: 140 mmol/L (ref 135–145)

## 2022-07-25 LAB — MAGNESIUM: Magnesium: 2.2 mg/dL (ref 1.7–2.4)

## 2022-07-25 MED ORDER — MELATONIN 5 MG PO TABS
5.0000 mg | ORAL_TABLET | Freq: Once | ORAL | Status: AC
  Administered 2022-07-25: 5 mg via ORAL
  Filled 2022-07-25: qty 1

## 2022-07-25 MED ORDER — SODIUM CHLORIDE 0.9 % IV SOLN: INTRAVENOUS | Status: DC

## 2022-07-25 NOTE — Progress Notes (Addendum)
Progress Note   Patient: Danielle [name]:[id number] DOB: [birth year] DOA: 08/09/2022     7 DOS: the patient was seen and examined on 07/25/2022   Brief hospital course: Danielle [name] is a 66 y.o. female with medical history significant for Paroxysmal A-fib on chronic anticoagulation therapy, history of chronic systolic heart failure, history of adenocarcinoma of the right lung status post radiation therapy, history of stage IIIb chronic kidney disease, aortic valve stenosis, COPD with chronic respiratory failure on 4 L of oxygen continuous, history of left breast cancer status post mastectomy, history of esophageal cancer recurrent right-sided pleural effusion requiring thoracentesis, most recently on 06/19/2022 who presented to the ER on 07/17/2022 for evaluation of worsening shortness of breath with associated chest tightness.     ED course and evaluation --- BP 135/99 with RR 29 with O2 sat 90% on increased O2 flow rate of 5 L/min.  Labs with troponin 15>>17, BNP 2935.  Treated with IV Lasix in the ED.   Chest x-ray showed -- Small right pleural effusion, increased since prior study.  Increasing right basilar consolidation, which may reflect airspace disease or progressive atelectasis.  Mass-like linear consolidation right suprahilar region, unchanged since recent CT.   Patient was admitted to hospitalist service and IV diuresis was continued.     IR was consulted for thoracentesis which was done on 10/5 with 900 cc fluid removed.   Hospital course complicated by: --Acute kidney injury after diuresis due to cardiorenal physiology.  Nephrology was consulted.   Renal function improved with diuretics held, but pt with persistent and worsening exertional dyspnea again. --Abdominal pain for which evaluation including abdominal xray and CT abdomen/pelvis was unremarkable. --Swallowing difficulties with severe esophageal dysmotility discovered on barium esophagram.     Assessment and Plan: *  Acute on chronic combined systolic and diastolic CHF (congestive heart failure) ([place]) Echo 01/30/22 -- EF 35-40% with regional WMA's, grade II diastolic dysfunction, moderate MR, mod-severe TR, moderate AS. Presented with recurrent pleural effusion and progressively worsening dyspnea. S/p thoracentesis --Stopped IV Lasix given Cr rising --torsemide and spiro resumed by nephro but worsening renal function today, will hold --Pulmonology consulted, Recommended against repeat thoracentesis given likelihood for rapid re-accumulation --Strict I/O's, daily weights --Monitor renal function, electrolytes   Esophageal dysmotility Pt reports difficulty with food and especially pills getting stuck in her esophagus and come back up at times, take a long time to pass into her stomach, causes a lot of discomfort.  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 --Reviewed prior EGD results from May this year, no mention of esophageal findings. --Esophagram 10/7 showed severe esophageal dysmotility, no evidence of obstruction or stenosis --48 hour calorie count is underway --Dietitian and SLP consults --Palliative care consulted  Prolonged QT interval QTc was 592 on EKG on admission.   Improved - repeat EKG 10/6 QTc 450 ms. --Serial EKG's to monitor --Keep K>4, Mg>2 --Telemetry monitoring  Malignant neoplasm of upper lobe of right lung Malignant neoplasm of lower third of esophagus Patient is status post radiation therapy for esophageal cancer S/p radiation therapy for lung cancer Follow-up with oncology  Recurrent right pleural effusion Presented with shortness of breath, dyspnea on exertion with chest discomfort, desaturating to the low 80s on her baseline flow rate.  Chest xray with increasing right pleural effusion from prior, BNP over 2000.   Chart reviewed: prior thoracenteses done this year on 1/30, 2/8, 4/16, 5/12, 7/25, 8/8, 9/5 --IR was consulted on admission. --Underwent  thoracentesis on 10/5 with 900 cc fluid removed. --Diuresis now held as above --Monitor respiratory status --Repeat CXR 10/9 shows worsening again --Pulmonology consulted, recommends against repeat thora. Added low dose aldactone for more diuresis and CHF benefit, now on hold as abive  Essential hypertension Now off IV Lasix  Bps soft Started on low dose midodrine.  Paroxysmal atrial fibrillation (HCC) Rates overall controlled. Reduced metoprolol 50 >> 12.5 mg BID due to lower BP's.  Had to completely hold metop for couple of days. BP's better on midodrine. --Resumed on metoprolol 12.5 mg PO BID with hold parameters for low BP --Telemetry --Continue amiodarone and Elqius  HLD (hyperlipidemia) On Lipitor  Abdominal pain Resolved, ct nothing acute  Anemia of chronic disease Hemoglobin at baseline  Hypotension History of same intermittently and limiting GDMT, and in setting of diuresis.   No fever or leukocytosis, does have abdominal pain. --Reduced metoprolol dose --Hold off hydralazine, Isordil --Maintain MAP>65 --Started low dose midodrine,  --Monitor BP closely  Chronic obstructive pulmonary disease (HCC) Severe COPD with chronic respiratory failure on 4 L of continuous O2 at home. --Continue scheduled and PRN bronchodilators  --Continue inhaled steroids --Pulmonology following  Aki on ckd 3b Developed AKI likely due to diuresis, cardiorenal physiology. Worsened today, holding diuretics Nephrology following. Gentle fluids today  Depression Continue Celexa 20 mg daily  Acute on chronic respiratory failure with hypoxia (HCC) Baseline on 4 L/min continuous O2. O2 sats on admission were only 90% on 5 l/min in setting of decompensated CHF with recurrent pleural effusion. 10/6>>10: on baseline 4 L O2 but with ongoing severe DOE --Supplement O2 to keep sats 88-94% --Mgmt of underlying issues as outlined   Carcinoma of overlapping sites of left breast in female,  estrogen receptor positive ([place]) Noted. No acute issues.        Subjective: Patient seen awake sitting up in bed.  She denies abdominal pain. Swallow issues are about the same.  Breathing stable.   Physical Exam: Vitals:   07/25/22 0352 07/25/22 0358 07/25/22 0400 07/25/22 0808  BP:  (!) 88/55 94/60 103/72  Pulse:  69  69  Resp:  18  18  Temp:  98 F (36.7 C)  97.7 F (36.5 C)  TempSrc:      SpO2:  95%  95%  Weight: 51.1 kg     Height:       General exam: awake, alert, no acute distress HEENT: moist mucus membranes, hearing grossly normal  Respiratory system: rales at bases, normal wob, no tachypnea Cardiovascular system: normal Y6/M6,  RRR, systolic murmur noted, no peripheral edema Gastrointestinal system: abdomen is soft, non-tender, no guarding or rebound Central nervous system: A&O x3. no gross focal neurologic deficits, normal speech Extremities: moves all, no edema, normal tone Psychiatry: normal mood, congruent affect, judgement and insight appear normal   Data Reviewed:  Notable labs: Creatinine 2.38>>2.77>> 2.53>>2.25>>1.80 >> 1.96 , BUN 38    Family Communication: None at bedside. Daughter updated telephonically 10/11.  Consults: Palliative Care Pulmonology Nephrology   Disposition: Status is: Inpatient Remains inpatient appropriate because: worsening kidney function Anticipate stable to d/c in 24-48 hrs pending improvement in kidney function   Planned Discharge Destination: Home with home health   Author: Desma Maxim, MD 07/25/2022 11:49 AM  For on call review [email or web address].

## 2022-07-25 NOTE — Progress Notes (Signed)
Occupational Therapy Treatment Patient Details Name: Danielle [name] MRN: [id number] DOB: 25-Jun-1956 Today's Date: 07/25/2022   History of present illness Pt is a 66 year old female presented to the ER on 07/17/2022 for evaluation of worsening shortness of breath with associated chest tightness; PMH significant for Paroxysmal A-fib on chronic anticoagulation therapy, history of chronic systolic heart failure, history of adenocarcinoma of the right lung status post radiation therapy, history of stage IIIb chronic kidney disease, aortic valve stenosis, COPD with chronic respiratory failure on 4 L of oxygen continuous, history of left breast cancer status post mastectomy, history of esophageal cancer recurrent right-sided pleural effusion requiring thoracentesis   OT comments  Chart reviewed, pt greeted in bed, agreeable to OT tx session. Tx session targeted improving ease of LB dressing via use of AE. Pt provided education, demo of use with reacher and sock aid. Pt is able to perform with SET UP with vcs for technique. Pt reports she doesn't feel up for more activity on this date, feeling tired and worn out. Pt is in agreement to attempt getting up to chair for dinner. Pt is left, all needs met. Pt reports she will reconsider HHOT. OT will continue to follow acutely.    Recommendations for follow up therapy are one component of a multi-disciplinary discharge planning process, led by the attending physician.  Recommendations may be updated based on patient status, additional functional criteria and insurance authorization.    Follow Up Recommendations  Home health OT    Assistance Recommended at Discharge Intermittent Supervision/Assistance  Patient can return home with the following  A little help with walking and/or transfers;A little help with bathing/dressing/bathroom   Equipment Recommendations  Tub/shower seat    Recommendations for Other Services      Precautions / Restrictions  Precautions Precautions: Fall Restrictions Weight Bearing Restrictions: No       Mobility Bed Mobility Overal bed mobility: Needs Assistance Bed Mobility: Supine to Sit     Supine to sit: Modified independent (Device/Increase time), HOB elevated Sit to supine: Modified independent (Device/Increase time), HOB elevated        Transfers Overall transfer level: Needs assistance Equipment used: Rolling walker (2 wheels) Transfers: Sit to/from Stand Sit to Stand: Modified independent (Device/Increase time)                 Balance Overall balance assessment: Needs assistance Sitting-balance support: Feet supported Sitting balance-Leahy Scale: Good     Standing balance support: During functional activity, Bilateral upper extremity supported Standing balance-Leahy Scale: Good                             ADL either performed or assessed with clinical judgement   ADL Overall ADL's : Needs assistance/impaired     Grooming: Sitting;Set up               Lower Body Dressing: Set up;Sitting/lateral leans Lower Body Dressing Details (indicate cue type and reason): vcs for technique with use of reacher, sock aid   Toilet Transfer Details (indicate cue type and reason): pt declined                Extremity/Trunk Assessment              Vision       Perception     Praxis      Cognition Arousal/Alertness: Awake/alert Behavior During Therapy: WFL for tasks assessed/performed Overall Cognitive Status: Within Functional Limits  for tasks assessed                                          Exercises      Shoulder Instructions       General Comments      Pertinent Vitals/ Pain       Pain Assessment Pain Assessment: No/denies pain  Home Living                                          Prior Functioning/Environment              Frequency  Min 2X/week        Progress Toward Goals  OT  Goals(current goals can now be found in the care plan section)  Progress towards OT goals: Progressing toward goals     Plan Discharge plan remains appropriate    Co-evaluation                 AM-PAC OT "6 Clicks" Daily Activity     Outcome Measure   Help from another person eating meals?: None Help from another person taking care of personal grooming?: None Help from another person toileting, which includes using toliet, bedpan, or urinal?: None   Help from another person to put on and taking off regular upper body clothing?: None Help from another person to put on and taking off regular lower body clothing?: None 6 Click Score: 20    End of Session Equipment Utilized During Treatment: Rolling walker (2 wheels);Oxygen  OT Visit Diagnosis: Unsteadiness on feet (R26.81)   Activity Tolerance Patient tolerated treatment well   Patient Left in bed;with call bell/phone within reach (at edge of bed)   Nurse Communication Mobility status        Time: 3428-7681 OT Time Calculation (min): 10 min  Charges: OT General Charges $OT Visit: 1 Visit OT Treatments $Self Care/Home Management : 8-22 mins  Shanon Payor, OTD OTR/L  07/25/22, 3:47 PM

## 2022-07-25 NOTE — Progress Notes (Signed)
Calorie Count Note  48 hour calorie count ordered.  Diet: regular Supplements: Boost Breeze po TID, each supplement provides 250 kcal and 9 grams of protein; Magic cup TID with meals, each supplement provides 290 kcal and 9 grams of protein   Spoke with pt at bedside, who reports feeling better today. She reports her appetite has improved since being admitted. She shares that she ate most of her breakfast and typically eats well for dinner, but often does not feel hungry for lunch, as she feels her meals are delivered too soon apart. Discussed with pt when she would prefer to have her meals and relayed this information to nutritional services staff. RD also ordered her lunch in her desired time frame (Breakfast: 9 AM; Lunch: 1:30 PM; Dinner: 6 PM). She is consuming Boost Breeze supplements.   Per pt, she still is unsure she wants to pursue feeding tube placement, but reports she talked about it more with her daughter yesterday. Pt shares that she will likely pursue  feeding tube placement if needed, however, is hopeful that she does not need it. RD validated pt concerns and provided emotional support. Encouraged continued goals of care discussions with family members and RD voiced that pt would be supported with whatever option she wanted to pursue.   Dinner 10/9: 100% tilapia, corn, collard greens and a roll    Total- 491kcal and 27g protein    Breakfast 10/10: 50% scrambled eggs, 50% [place] sausage, 100% pineapple, 100% milk    Total- 317kcal and 18g protein    Lunch 10/10: 100% vegetable soup, grilled cheese, white cake and peaches   Total- 481kcal and 13g protein    Total Intake:  1289kcal (92% estimated needs) and  58g protein (83% estimated needs)  10/10-10/11 Breakfast: 647 kcals, 20 grams protein Lunch: nothing available yet Dinner: 385 kcals, 14 grams protein Supplements: 2 Boost Breeze (500 kcals, 18 grams protein)  Total intake: 1532 kcal (100% of minimum estimated needs)   52 grams protein (74% of minimum estimated needs)  Nutrition Dx: Severe Malnutrition related to chronic illness as evidenced by severe fat depletion, severe muscle depletion; ongoing  Goal: Patient will meet greater than or equal to 90% of their needs; progressing  Intervention:   -Continue 48 hours calorie count -Continue Magic cup TID with meals, each supplement provides 290 kcal and 9 grams of protein  -Continue Boost Breeze po TID, each supplement provides 250 kcal and 9 grams of protein  -Continue MVI with minerals daily  Loistine Chance, RD, LDN, [place] Registered Dietitian II Certified Diabetes Care and Education Specialist Please refer to [facility] for RD and/or RD on-call/weekend/after hours pager

## 2022-07-25 NOTE — Progress Notes (Signed)
[facility]  ROUNDING NOTE   Subjective:   Danielle [name] 66 year old female with past medical conditions including diabetes, hypertension, lymphedema, breast cancer COPD and chronic kidney disease.  Patient presents to the emergency department of shortness of breath and has been admitted for Pleural effusion [J90] Status post thoracentesis [Z98.890] CHF exacerbation (HCC) [I50.9] Pleural effusion on right [J90] Acute on chronic combined systolic and diastolic CHF (congestive heart failure) (HCC) [I50.43] Acute on chronic respiratory failure with hypoxia ([place]) [J96.21]  Patient is known to our practice and is followed outpatient by Dr. Holley Raring.  She was last seen in office on April 19, 2022.    Update: Patient seen sitting up in bed, alert and oriented Remains on 3 L nasal cannula, home baseline No lower extremity edema Continues to complain of weakness  Creatinine 2.27   Objective:  Vital signs in last 24 hours:  Temp:  [97.5 F (36.4 C)-98 F (36.7 C)] 97.5 F (36.4 C) (10/11 1217) Pulse Rate:  [62-87] 87 (10/11 1217) Resp:  [18] 18 (10/11 0808) BP: (88-140)/(55-93) 92/69 (10/11 1217) SpO2:  [87 %-98 %] 92 % (10/11 1217) Weight:  [51.1 kg] 51.1 kg (10/11 0352)  Weight change: -2.062 kg Filed Weights   07/23/22 0544 07/24/22 0539 07/25/22 0352  Weight: 54.4 kg 53.2 kg 51.1 kg    Intake/Output: I/O last 3 completed shifts: In: 3 [I.V.:3] Out: 0    Intake/Output this shift:  Total I/O In: 75 [P.O.:75] Out: -   Physical Exam: General: NAD, resting comfortably  Head: Normocephalic, atraumatic. Moist oral mucosal membranes  Eyes: Anicteric  Lungs:  Clear to auscultation, normal effort, [place] O2  Heart: Irregular rate and rhythm  Abdomen:  Soft, tender, nondistended  Extremities: No peripheral edema.  Neurologic: Nonfocal, moving all four extremities  Skin: No lesions  Access: None    Basic Metabolic Panel: Recent Labs  Lab 07/20/22 0418  07/21/22 7371 07/22/22 0528 07/23/22 0507 07/24/22 0435 07/25/22 0443  NA 141 136 140 139 141 140  K 3.8 3.7 4.1 4.5 4.1 4.2  CL 105 104 109 109 106 105  CO2 25 26 27 26 26 25   GLUCOSE 84 82 91 92 86 93  BUN 50* 47* 48* 34* 38* 42*  CREATININE 2.77* 2.53* 2.25* 1.80* 1.96* 2.27*  CALCIUM 8.9 8.6* 8.5* 8.6* 9.1 8.9  MG 1.7 1.6* 2.1  --  1.7 2.2     Liver Function Tests: Recent Labs  Lab 08/13/2022 2205  AST 24  ALT 18  ALKPHOS 55  BILITOT 0.8  PROT 6.5  ALBUMIN 3.5    No results for input(s): "LIPASE", "AMYLASE" in the last 168 hours. No results for input(s): "AMMONIA" in the last 168 hours.  CBC: Recent Labs  Lab 08/08/2022 1810 07/20/22 0418 07/21/22 0632  WBC 3.9* 3.6* 3.7*  NEUTROABS 3.1  --   --   HGB 9.7* 9.5* 9.2*  HCT 29.9* 28.7* 28.3*  MCV 96.8 94.1 94.0  PLT 189 195 177     Cardiac Enzymes: No results for input(s): "CKTOTAL", "CKMB", "CKMBINDEX", "TROPONINI" in the last 168 hours.  BNP: Invalid input(s): "POCBNP"  CBG: No results for input(s): "GLUCAP" in the last 168 hours.  Microbiology: Results for orders placed or performed during the hospital encounter of 07/16/2022  Resp Panel by RT-PCR (Flu A&B, Covid) Anterior Nasal Swab     Status: None   Collection Time: 08/02/2022  6:43 PM   Specimen: Anterior Nasal Swab  Result Value Ref Range Status  SARS Coronavirus 2 by RT PCR NEGATIVE NEGATIVE Final    Comment: (NOTE) SARS-CoV-2 target nucleic acids are NOT DETECTED.  The SARS-CoV-2 RNA is generally detectable in upper respiratory specimens during the acute phase of infection. The lowest concentration of SARS-CoV-2 viral copies this assay can detect is 138 copies/mL. A negative result does not preclude SARS-Cov-2 infection and should not be used as the sole basis for treatment or other patient management decisions. A negative result may occur with  improper specimen collection/handling, submission of specimen other than nasopharyngeal swab,  presence of viral mutation(s) within the areas targeted by this assay, and inadequate number of viral copies(<138 copies/mL). A negative result must be combined with clinical observations, patient history, and epidemiological information. The expected result is Negative.  Fact Sheet for Patients:  EntrepreneurPulse.com.au  Fact Sheet for Healthcare Providers:  IncredibleEmployment.be  This test is no t yet approved or cleared by the [place] FDA and  has been authorized for detection and/or diagnosis of SARS-CoV-2 by FDA under an Emergency Use Authorization (EUA). This EUA will remain  in effect (meaning this test can be used) for the duration of the COVID-19 declaration under Section 564(b)(1) of the Act, 21 U.S.C.section 360bbb-3(b)(1), unless the authorization is terminated  or revoked sooner.       Influenza A by PCR NEGATIVE NEGATIVE Final   Influenza B by PCR NEGATIVE NEGATIVE Final    Comment: (NOTE) The Xpert Xpress SARS-CoV-2/FLU/RSV plus assay is intended as an aid in the diagnosis of influenza from Nasopharyngeal swab specimens and should not be used as a sole basis for treatment. Nasal washings and aspirates are unacceptable for Xpert Xpress SARS-CoV-2/FLU/RSV testing.  Fact Sheet for Patients: EntrepreneurPulse.com.au  Fact Sheet for Healthcare Providers: IncredibleEmployment.be  This test is not yet approved or cleared by the [place] FDA and has been authorized for detection and/or diagnosis of SARS-CoV-2 by FDA under an Emergency Use Authorization (EUA). This EUA will remain in effect (meaning this test can be used) for the duration of the COVID-19 declaration under Section 564(b)(1) of the Act, 21 U.S.C. section 360bbb-3(b)(1), unless the authorization is terminated or revoked.  Performed at [facility], [address] [place], [place] [address]      Coagulation Studies: No results for input(s): "LABPROT", "INR" in the last 72 hours.  Urinalysis: No results for input(s): "COLORURINE", "LABSPEC", "PHURINE", "GLUCOSEU", "HGBUR", "BILIRUBINUR", "KETONESUR", "PROTEINUR", "UROBILINOGEN", "NITRITE", "LEUKOCYTESUR" in the last 72 hours.  Invalid input(s): "APPERANCEUR"    Imaging: No results found.   Medications:    sodium chloride 40 mL/hr at 07/25/22 1141    amiodarone  200 mg Oral Daily   apixaban  2.5 mg Oral BID   atorvastatin  40 mg Oral Daily   citalopram  20 mg Oral Daily   feeding supplement  1 Container Oral TID BM   fluticasone furoate-vilanterol  1 puff Inhalation Daily   And   umeclidinium bromide  1 puff Inhalation Daily   metoprolol tartrate  12.5 mg Oral BID   midodrine  5 mg Oral TID WC   multivitamin with minerals  1 tablet Oral Daily   pantoprazole  40 mg Oral BID AC   polyethylene glycol  17 g Oral Daily   sodium bicarbonate  1,300 mg Oral Daily   sodium chloride flush  3 mL Intravenous Q12H   acetaminophen **OR** acetaminophen, albuterol, calcium carbonate, HYDROcodone-acetaminophen, simethicone  Assessment/ Plan:  Danielle [name] is a 66 y.o.  female with past  medical conditions including diabetes, hypertension, lymphedema, breast cancer COPD and chronic kidney disease.  Patient presents to the emergency department of shortness of breath and has been admitted for Pleural effusion [J90] Status post thoracentesis [Z98.890] CHF exacerbation ([place]) [I50.9] Pleural effusion on right [J90] Acute on chronic combined systolic and diastolic CHF (congestive heart failure) (HCC) [I50.43] Acute on chronic respiratory failure with hypoxia (HCC) [J96.21]   Acute Kidney Injury on chronic kidney disease stage IIIb with baseline creatinine 1.50 and GFR of 38 on 06/16/22.  Acute kidney injury secondary to cardiorenal syndrome and diuretic therapy Chronic kidney disease is secondary to hypertension and CHF.  No  IV contrast exposure.    Creatinine continues to increase with addition of diuretic therapy.  We will hold torsemide and spironolactone.  We will consider restarting after discharge.  Will order gentle hydration as patient continues to have poor appetite.  We will reevaluate with labs in AM.  Lab Results  Component Value Date   CREATININE 2.27 (H) 07/25/2022   CREATININE 1.96 (H) 07/24/2022   CREATININE 1.80 (H) 07/23/2022    Intake/Output Summary (Last 24 hours) at 07/25/2022 1409 Last data filed at 07/25/2022 1012 Gross per 24 hour  Intake 75 ml  Output 0 ml  Net 75 ml     2. Anemia of chronic kidney disease Lab Results  Component Value Date   HGB 9.2 (L) 07/21/2022    Hemoglobin at goal  3.  Hypertension with chronic kidney disease.  Home regimen includes torsemide, amiodarone, hydralazine, and isosorbide. Continue Midodrine 5mg  three times daily for BP support.  Diuretics held.  4.  Pleural effusions, recurrent.  Seen on chest x-ray on admission.  Radiology performed right thoracentesis on admission with 900 mL removed.   LOS: [place] 10/11/20232:09 PM

## 2022-07-25 NOTE — Progress Notes (Signed)
PULMONOLOGY         Date: 07/25/2022,   MRN# [id number] Danielle [name] 1956-02-18     AdmissionWeight: 50.3 kg                 CurrentWeight: 51.1 kg  Referring provider: Dr Arbutus Ped   CHIEF COMPLAINT:   Recurrent pleural effusions and advanced metastatic cancer.    HISTORY OF PRESENT ILLNESS   This is a very pleasant 66yo F with hx of advanced COPD, chronic anemia, paroxysmal AFchronic anticoagulation therapy, history of chronic systolic heart failure, history of adenocarcinoma of the right lung status post radiation therapy, history of stage IIIb chronic kidney disease, aortic valve stenosis, COPD with chronic respiratory failure on 4 L of oxygen continuous, history of left breast cancer status post mastectomy, history of esophageal cancer recurrent right-sided pleural effusion requiring thoracentesis Most recently on 06/19/2022 who presents to the ER for evaluation of worsening shortness of breath from her baseline.  She has associated chest tightness. She denies having mucus on expectoration, she is on 3-4L/min during my evaluation and notes when attempting to ambulate she has dyspnea.   07/24/22- patient is mildly imprved with bronchopulmonary hygiene .  Continue current care and may initiate DC planning.   07/25/22- no acute events overnight continue current care plan.  She is close to baseline.   PAST MEDICAL HISTORY   Past Medical History:  Diagnosis Date   Acute respiratory failure due to COVID-19 ([place]) 09/27/2019   Anemia    Anxiety    Aortic atherosclerosis ([place])    Aortic valve stenosis 02/10/2018   a.) TTE 02/10/2018: EF 55-60%: mild AS with MPG of 12 mmHg. b.) TTE 04/21/2018: EF 35-40%; mild AS with MPG 8 mmHg. c.) TTE 11/07/2019: EF 50-55%; mild AS with MPG 10 mmHg.   Arthritis    Atrial fibrillation (HCC)    a.) CHA2DS2-VASc = 4 (age, sex, HTN, aortic plaque). b.) Rate/rhythm maintained on oral carvedilol; chronically anticoagulated on dose reduced  apixaban. c.)  Attempted deployment of LAA occlusive device on 01/11/2021; parameters failed and procedure aborted.   Breast cancer, left ([place]) 2000   a.) T2N1M0; ER/PR (+) --> Tx'd with total mastectomy, LN resection, XRT, and chemotherapy   Cancer of right lung ([place]) 07/30/2016   a.) adenocarcinoma; ALK, ROS1, PDL1, BRAF, EGFR all negative.   CHF (congestive heart failure) (HCC)    Chronic diastolic CHF (congestive heart failure) ([place]) 11/26/2021   CKD (chronic kidney disease), stage IV (HCC)    COPD (chronic obstructive pulmonary disease) (HCC)    Dependence on supplemental oxygen    Depression    Diastolic dysfunction 99/77/4142   a.) TTE 02/10/2017: EF 55-60%; G2DD. b.) TTE 04/21/2018: EF 35-40%; mild LA dilation; mod MV regurgitation. c.) TTE 11/07/2019: EF 50-55%; G1DD.   DOE (dyspnea on exertion)    GERD (gastroesophageal reflux disease)    Heart murmur    History of 2019 novel coronavirus disease (COVID-19) 10/14/2019   HLD (hyperlipidemia)    Hypertension    Long term current use of anticoagulant    a.) apixaban   Lymphedema    Personal history of chemotherapy    Personal history of radiation therapy    Respiratory tract infection due to COVID-19 virus 09/27/2019   SBO (small bowel obstruction) ([place]) 11/05/2020   Vitamin D deficiency      SURGICAL HISTORY   Past Surgical History:  Procedure Laterality Date   Breast Biospy Left    [facility]  BREAST SURGERY     CARDIOVERSION N/A 02/23/2022   Procedure: CARDIOVERSION;  Surgeon: Lamar Blinks, MD;  Location: [facility] ORS;  Service: Cardiovascular;  Laterality: N/A;   COLONOSCOPY N/A 04/30/2018   Procedure: COLONOSCOPY;  Surgeon: Pasty Spillers, MD;  Location: [facility] ENDOSCOPY;  Service: Endoscopy;  Laterality: N/A;   COLONOSCOPY N/A 07/22/2018   Procedure: COLONOSCOPY;  Surgeon: Pasty Spillers, MD;  Location: [facility] ENDOSCOPY;  Service: Endoscopy;  Laterality: N/A;   COLONOSCOPY WITH PROPOFOL N/A 09/21/2021    Procedure: COLONOSCOPY WITH PROPOFOL;  Surgeon: Sung Amabile, DO;  Location: [facility] ENDOSCOPY;  Service: General;  Laterality: N/A;   COLONOSCOPY WITH PROPOFOL N/A 03/11/2022   Procedure: COLONOSCOPY WITH PROPOFOL;  Surgeon: Jaynie Collins, DO;  Location: [facility] ENDOSCOPY;  Service: Gastroenterology;  Laterality: N/A;   DILATION AND CURETTAGE OF UTERUS     ELECTROMAGNETIC NAVIGATION BROCHOSCOPY Right 04/11/2016   Procedure: ELECTROMAGNETIC NAVIGATION BRONCHOSCOPY;  Surgeon: Stephanie Acre, MD;  Location: [facility] ORS;  Service: Cardiopulmonary;  Laterality: Right;   ESOPHAGOGASTRODUODENOSCOPY N/A 07/22/2018   Procedure: ESOPHAGOGASTRODUODENOSCOPY (EGD);  Surgeon: Pasty Spillers, MD;  Location: [facility] ENDOSCOPY;  Service: Endoscopy;  Laterality: N/A;   ESOPHAGOGASTRODUODENOSCOPY (EGD) WITH PROPOFOL N/A 05/07/2018   Procedure: ESOPHAGOGASTRODUODENOSCOPY (EGD) WITH PROPOFOL;  Surgeon: Midge Minium, MD;  Location: [facility] ENDOSCOPY;  Service: Endoscopy;  Laterality: N/A;   ESOPHAGOGASTRODUODENOSCOPY (EGD) WITH PROPOFOL N/A 04/24/2019   Procedure: ESOPHAGOGASTRODUODENOSCOPY (EGD) WITH PROPOFOL;  Surgeon: Wyline Mood, MD;  Location: [facility] ENDOSCOPY;  Service: Gastroenterology;  Laterality: N/A;   ESOPHAGOGASTRODUODENOSCOPY (EGD) WITH PROPOFOL N/A 01/12/2020   Procedure: ESOPHAGOGASTRODUODENOSCOPY (EGD) WITH PROPOFOL;  Surgeon: Wyline Mood, MD;  Location: [facility] ENDOSCOPY;  Service: Gastroenterology;  Laterality: N/A;   ESOPHAGOGASTRODUODENOSCOPY (EGD) WITH PROPOFOL N/A 04/28/2020   Procedure: ESOPHAGOGASTRODUODENOSCOPY (EGD) WITH PROPOFOL;  Surgeon: Wyline Mood, MD;  Location: [facility] ENDOSCOPY;  Service: Gastroenterology;  Laterality: N/A;   ESOPHAGOGASTRODUODENOSCOPY (EGD) WITH PROPOFOL N/A 03/11/2022   Procedure: ESOPHAGOGASTRODUODENOSCOPY (EGD) WITH PROPOFOL;  Surgeon: Jaynie Collins, DO;  Location: [facility] ENDOSCOPY;  Service: Gastroenterology;  Laterality: N/A;   EUS N/A 05/07/2019   Procedure: FULL  UPPER ENDOSCOPIC ULTRASOUND (EUS) RADIAL;  Surgeon: Rayann Heman, MD;  Location: [facility] ENDOSCOPY;  Service: Endoscopy;  Laterality: N/A;   ILEOSCOPY N/A 07/22/2018   Procedure: ILEOSCOPY THROUGH STOMA;  Surgeon: Pasty Spillers, MD;  Location: [facility] ENDOSCOPY;  Service: Endoscopy;  Laterality: N/A;   ILEOSTOMY     ILEOSTOMY N/A 09/08/2018   Procedure: ILEOSTOMY REVISION POSSIBLE CREATION;  Surgeon: Carolan Shiver, MD;  Location: [facility] ORS;  Service: General;  Laterality: N/A;   ILEOSTOMY CLOSURE N/A 08/15/2018   Procedure: DILATION OF ILEOSTOMY STRICTURE;  Surgeon: Carolan Shiver, MD;  Location: [facility] ORS;  Service: General;  Laterality: N/A;   LAPAROTOMY Right 05/04/2018   Procedure: EXPLORATORY LAPAROTOMY right colectomy right and left ostomy;  Surgeon: Carolan Shiver, MD;  Location: [facility] ORS;  Service: General;  Laterality: Right;   LEFT ATRIAL APPENDAGE OCCLUSION N/A 01/11/2021   Procedure: LEFT ATRIAL APPENDAGE OCCLUSION (WATCHMAN's DEVICE); ABORTED PROCEDURE WITHOUT DEVICE BEING IMPLANTED; Location: [facility]; Surgeon: Gerre Pebbles, MD   LUNG BIOPSY     MASTECTOMY Left    2000, [facility]   ROTATOR CUFF REPAIR Right    [facility]   XI ROBOTIC ASSISTED COLOSTOMY TAKEDOWN N/A 10/23/2021   Procedure: XI ROBOTIC ASSISTED ILEOSTOMY TAKEDOWN;  Surgeon: Carolan Shiver, MD;  Location: [facility] ORS;  Service: General;  Laterality: N/A;  180 minutes for the surgery part please     FAMILY HISTORY  Family History  Problem Relation Age of Onset   Breast cancer Mother 72   Cancer Mother        Breast    Cirrhosis Father    Breast cancer Paternal Aunt 57   Cancer Maternal Aunt        Breast      SOCIAL HISTORY   Social History   Tobacco Use   Smoking status: Former    Packs/day: 0.50    Years: 20.00    Total pack years: 10.00    Types: Cigarettes    Quit date: 07/02/2012    Years since quitting: 10.0   Smokeless tobacco: Current    Types: Snuff  Vaping Use   Vaping  Use: Never used  Substance Use Topics   Alcohol use: Yes    Alcohol/week: 3.0 standard drinks of alcohol    Types: 3 Cans of beer per week    Comment: Occasionally beer   Drug use: No     MEDICATIONS    Home Medication:    Current Medication:  Current Facility-Administered Medications:    acetaminophen (TYLENOL) tablet 650 mg, 650 mg, Oral, Q6H PRN, 650 mg at 07/25/22 0909 **OR** acetaminophen (TYLENOL) suppository 650 mg, 650 mg, Rectal, Q6H PRN, Athena Masse, MD   albuterol (PROVENTIL) (2.5 MG/3ML) 0.083% nebulizer solution 2.5 mg, 2.5 mg, Inhalation, Q6H PRN, Judd Gaudier V, MD, 2.5 mg at 07/24/22 0145   amiodarone (PACERONE) tablet 200 mg, 200 mg, Oral, Daily, Judd Gaudier V, MD, 200 mg at 07/25/22 0910   apixaban (ELIQUIS) tablet 2.5 mg, 2.5 mg, Oral, BID, Judd Gaudier V, MD, 2.5 mg at 07/25/22 0910   atorvastatin (LIPITOR) tablet 40 mg, 40 mg, Oral, Daily, Judd Gaudier V, MD, 40 mg at 07/25/22 6226   calcium carbonate (TUMS - dosed in mg elemental calcium) chewable tablet 200 mg of elemental calcium, 1 tablet, Oral, TID PRN, Nicole Kindred A, DO, 200 mg of elemental calcium at 07/24/22 2203   citalopram (CELEXA) tablet 20 mg, 20 mg, Oral, Daily, Nicole Kindred A, DO, 20 mg at 07/25/22 0912   feeding supplement (BOOST / RESOURCE BREEZE) liquid 1 Container, 1 Container, Oral, TID BM, Nicole Kindred A, DO, 1 Container at 07/25/22 0915   fluticasone furoate-vilanterol (BREO ELLIPTA) 100-25 MCG/ACT 1 puff, 1 puff, Inhalation, Daily, 1 puff at 07/25/22 0915 **AND** umeclidinium bromide (INCRUSE ELLIPTA) 62.5 MCG/ACT 1 puff, 1 puff, Inhalation, Daily, Judd Gaudier V, MD, 1 puff at 07/25/22 0915   HYDROcodone-acetaminophen (NORCO/VICODIN) 5-325 MG per tablet 1-2 tablet, 1-2 tablet, Oral, Q4H PRN, Athena Masse, MD, 2 tablet at 07/24/22 2204   metoprolol tartrate (LOPRESSOR) tablet 12.5 mg, 12.5 mg, Oral, BID, Lanney Gins, Nicholson Starace, MD, 12.5 mg at 07/25/22 0909   midodrine  (PROAMATINE) tablet 5 mg, 5 mg, Oral, TID WC, Nicole Kindred A, DO, 5 mg at 07/25/22 0909   multivitamin with minerals tablet 1 tablet, 1 tablet, Oral, Daily, Nicole Kindred A, DO, 1 tablet at 07/25/22 0910   pantoprazole (PROTONIX) EC tablet 40 mg, 40 mg, Oral, BID AC, Judd Gaudier V, MD, 40 mg at 07/25/22 0910   polyethylene glycol (MIRALAX / GLYCOLAX) packet 17 g, 17 g, Oral, Daily, Breeze, Shantelle, NP, 17 g at 07/25/22 0909   simethicone (MYLICON) 40 JF/3.5KT suspension 40 mg, 40 mg, Oral, QID PRN, Oswald Hillock, RPH, 40 mg at 07/21/22 2249   sodium bicarbonate tablet 1,300 mg, 1,300 mg, Oral, Daily, Judd Gaudier V, MD, 1,300 mg at 07/25/22 [phone]  sodium chloride flush (NS) 0.9 % injection 3 mL, 3 mL, Intravenous, Q12H, Nicole Kindred A, DO, 3 mL at 07/25/22 0915   spironolactone (ALDACTONE) tablet 12.5 mg, 12.5 mg, Oral, Daily, Candi Profit, MD, 12.5 mg at 07/25/22 0919   torsemide (DEMADEX) tablet 40 mg, 40 mg, Oral, QODAY, Nicole Kindred A, DO, 40 mg at 07/25/22 0910    ALLERGIES   Patient has no known allergies.     REVIEW OF SYSTEMS    Review of Systems:  Gen:  Denies  fever, sweats, chills weigh loss  HEENT: Denies blurred vision, double vision, ear pain, eye pain, hearing loss, nose bleeds, sore throat Cardiac:  No dizziness, chest pain or heaviness, chest tightness,edema Resp:   reports dyspnea chronically  Gi: Denies swallowing difficulty, stomach pain, nausea or vomiting, diarrhea, constipation, bowel incontinence Gu:  Denies bladder incontinence, burning urine Ext:   Denies Joint pain, stiffness or swelling Skin: Denies  skin rash, easy bruising or bleeding or hives Endoc:  Denies polyuria, polydipsia , polyphagia or weight change Psych:   Denies depression, insomnia or hallucinations   Other:  All other systems negative   VS: BP 103/72 (BP Location: Right Arm)   Pulse 69   Temp 97.7 F (36.5 C)   Resp 18   Ht $R'5\' 6"'Wl$  (1.676 m)   Wt 51.1 kg    SpO2 95%   BMI 18.18 kg/m      PHYSICAL EXAM    GENERAL:NAD, no fevers, chills, no weakness no fatigue HEAD: Normocephalic, atraumatic.  EYES: Pupils equal, round, reactive to light. Extraocular muscles intact. No scleral icterus.  MOUTH: Moist mucosal membrane. Dentition intact. No abscess noted.  EAR, NOSE, THROAT: Clear without exudates. No external lesions.  NECK: Supple. No thyromegaly. No nodules. No JVD.  PULMONARY: decreased breath sounds with mild rhonchi worse at bases bilaterally.  CARDIOVASCULAR: S1 and S2. Regular rate and rhythm. No murmurs, rubs, or gallops. No edema. Pedal pulses 2+ bilaterally.  GASTROINTESTINAL: Soft, nontender, nondistended. No masses. Positive bowel sounds. No hepatosplenomegaly.  MUSCULOSKELETAL: No swelling, clubbing, or edema. Range of motion full in all extremities.  NEUROLOGIC: Cranial nerves II through XII are intact. No gross focal neurological deficits. Sensation intact. Reflexes intact.  SKIN: No ulceration, lesions, rashes, or cyanosis. Skin warm and dry. Turgor intact.  PSYCHIATRIC: Mood, affect within normal limits. The patient is awake, alert and oriented x 3. Insight, judgment intact.       IMAGING     ASSESSMENT/PLAN   Acute on chronic hypoxemic respiratory failure -patient with advanced COPD/CHF/CKD/lung cancer -she is with a least moderate severity physical deconditioning -all of these comorbidities are currently playing role in dyspnea -Advanced COPD - continue ICS/LABA/LAMA with Breo Ellipta and Incruse and addition of PRN albuterol nebulizer  -CHF/CKD overlap- adding aldactone 12.5 and reducing metoprolol from 25 to 12.5 bid to allow hemodynamic leveling with midodrine and Atrial fibrillation while diuresing 3. Atelectasis - worse at RLL - Metaneb with saline q8h 4. Right pleural effusion -  adding more diuresis, holding thoracentesis due to high likelyhood of rapid re-accumulation 5. Physical deconditioning- patient is  currently with PT/OT and would benefit from same on outpatient 6. Atrial fibrillation - chronic stable - continue eliquis, metoprolol, amiodarone          Thank you for allowing me to participate in the care of this patient.   Patient/Family are satisfied with care plan and all questions have been answered.    Provider disclosure: Patient with at least  one acute or chronic illness or injury that poses a threat to life or bodily function and is being managed actively during this encounter.  All of the below services have been performed independently by signing provider:  review of prior documentation from internal and or external health records.  Review of previous and current lab results.  Interview and comprehensive assessment during patient visit today. Review of current and previous chest radiographs/CT scans. Discussion of management and test interpretation with health care team and patient/family.   This document was prepared using Dragon voice recognition software and may include unintentional dictation errors.     Ottie Glazier, M.D.  Division of Pulmonary & Critical Care Medicine

## 2022-07-26 ENCOUNTER — Inpatient Hospital Stay: Payer: Medicare Other

## 2022-07-26 DIAGNOSIS — I5043 Acute on chronic combined systolic (congestive) and diastolic (congestive) heart failure: Secondary | ICD-10-CM | POA: Diagnosis not present

## 2022-07-26 LAB — RENAL FUNCTION PANEL
Albumin: 3.1 g/dL — ABNORMAL LOW (ref 3.5–5.0)
Anion gap: 5 (ref 5–15)
BUN: 46 mg/dL — ABNORMAL HIGH (ref 8–23)
CO2: 26 mmol/L (ref 22–32)
Calcium: 8.5 mg/dL — ABNORMAL LOW (ref 8.9–10.3)
Chloride: 110 mmol/L (ref 98–111)
Creatinine, Ser: 2.32 mg/dL — ABNORMAL HIGH (ref 0.44–1.00)
GFR, Estimated: 22 mL/min — ABNORMAL LOW (ref >60)
Glucose, Bld: 97 mg/dL (ref 70–99)
Phosphorus: 6 mg/dL — ABNORMAL HIGH (ref 2.5–4.6)
Potassium: 4.3 mmol/L (ref 3.5–5.1)
Sodium: 141 mmol/L (ref 135–145)

## 2022-07-26 MED ORDER — ALBUTEROL SULFATE (2.5 MG/3ML) 0.083% IN NEBU
2.5000 mg | INHALATION_SOLUTION | Freq: Two times a day (BID) | RESPIRATORY_TRACT | Status: DC
  Administered 2022-07-26 – 2022-07-27 (×2): 2.5 mg via RESPIRATORY_TRACT
  Filled 2022-07-26 (×2): qty 3

## 2022-07-26 MED ORDER — MELATONIN 5 MG PO TABS
5.0000 mg | ORAL_TABLET | Freq: Every evening | ORAL | Status: DC | PRN
  Administered 2022-07-26 – 2022-08-22 (×9): 5 mg via ORAL
  Filled 2022-07-26 (×9): qty 1

## 2022-07-26 NOTE — Progress Notes (Signed)
Progress Note   Patient: Danielle [name] FBP:[id number] DOB: 1956/04/09 DOA: 07/19/2022     8 DOS: the patient was seen and examined on 07/26/2022   Brief hospital course: LONDA MACKOWSKI is a 66 y.o. female with medical history significant for Paroxysmal A-fib on chronic anticoagulation therapy, history of chronic systolic heart failure, history of adenocarcinoma of the right lung status post radiation therapy, history of stage IIIb chronic kidney disease, aortic valve stenosis, COPD with chronic respiratory failure on 4 L of oxygen continuous, history of left breast cancer status post mastectomy, history of esophageal cancer recurrent right-sided pleural effusion requiring thoracentesis, most recently on 06/19/2022 who presented to the ER on 07/29/2022 for evaluation of worsening shortness of breath with associated chest tightness.     ED course and evaluation --- BP 135/99 with RR 29 with O2 sat 90% on increased O2 flow rate of 5 L/min.  Labs with troponin 15>>17, BNP 2935.  Treated with IV Lasix in the ED.   Chest x-ray showed -- Small right pleural effusion, increased since prior study.  Increasing right basilar consolidation, which may reflect airspace disease or progressive atelectasis.  Mass-like linear consolidation right suprahilar region, unchanged since recent CT.   Patient was admitted to hospitalist service and IV diuresis was continued.     IR was consulted for thoracentesis which was done on 10/5 with 900 cc fluid removed.   Hospital course complicated by: --Acute kidney injury after diuresis due to cardiorenal physiology.  Nephrology was consulted.   Renal function improved with diuretics held, but pt with persistent and worsening exertional dyspnea again. --Abdominal pain for which evaluation including abdominal xray and CT abdomen/pelvis was unremarkable. --Swallowing difficulties with severe esophageal dysmotility discovered on barium esophagram.     Assessment and Plan: *  Acute on chronic combined systolic and diastolic CHF (congestive heart failure) ([place]) Echo 01/30/22 -- EF 35-40% with regional WMA's, grade II diastolic dysfunction, moderate MR, mod-severe TR, moderate AS. Presented with recurrent pleural effusion and progressively worsening dyspnea. S/p thoracentesis --off lasix --torsemide and spiro resumed by nephro but worsening renal function 10/11, will continue to hold as kidney function remains off from baseline --Pulmonology following, Recommended against repeat thoracentesis given likelihood for rapid re-accumulation --Strict I/O's, daily weights --Monitor renal function, electrolytes   Esophageal dysmotility Pt reports difficulty with food and especially pills getting stuck in her esophagus and come back up at times, take a long time to pass into her stomach, causes a lot of discomfort.  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 --Reviewed prior EGD results from May this year, no mention of esophageal findings. --Esophagram 10/7 showed severe esophageal dysmotility, no evidence of obstruction or stenosis --calorie count is normal, no need for PEG at the moment --Dietitian and SLP consults --Palliative care consulted  Prolonged QT interval QTc was 592 on EKG on admission.   Improved - repeat EKG 10/6 QTc 450 ms. --Serial EKG's to monitor --Keep K>4, Mg>2 --Telemetry monitoring  Malignant neoplasm of upper lobe of right lung Malignant neoplasm of lower third of esophagus Patient is status post radiation therapy for esophageal cancer S/p radiation therapy for lung cancer Follow-up with oncology  Recurrent right pleural effusion Presented with shortness of breath, dyspnea on exertion with chest discomfort, desaturating to the low 80s on her baseline flow rate.  Chest xray with increasing right pleural effusion from prior, BNP over 2000.   Chart reviewed: prior thoracenteses done this year on 1/30, 2/8, 4/16, 5/12,  7/25, 8/8,  9/5 --IR was consulted on admission. --Underwent thoracentesis on 10/5 with 900 cc fluid removed. --Diuresis now held as above --Monitor respiratory status --Repeat CXR 10/9 shows worsening again --Pulmonology consulted, recommends against repeat thora. Added low dose aldactone for more diuresis and CHF benefit, now on hold as abive - given worsening dyspnea today, 6L requirement, will repeat cxr, would discusss pleurx cathetr w/ pulm if sig effusion  Essential hypertension Now off IV Lasix  Bps soft Started on low dose midodrine.  Paroxysmal atrial fibrillation (HCC) Rates overall controlled. Reduced metoprolol 50 >> 12.5 mg BID due to lower BP's.  Had to completely hold metop for couple of days. BP's better on midodrine. --Resumed on metoprolol 12.5 mg PO BID with hold parameters for low BP --Telemetry --Continue amiodarone and Elqius  HLD (hyperlipidemia) On Lipitor  Abdominal pain Resolved, ct nothing acute  Anemia of chronic disease Hemoglobin at baseline  Hypotension History of same intermittently and limiting GDMT, and in setting of diuresis.   No fever or leukocytosis, does have abdominal pain. --Reduced metoprolol dose --Hold off hydralazine, Isordil --Maintain MAP>65 --Started low dose midodrine,  --Monitor BP closely  Chronic obstructive pulmonary disease (HCC) Severe COPD with chronic respiratory failure on 4 L of continuous O2 at home. --Continue scheduled and PRN bronchodilators  --Continue inhaled steroids --Pulmonology following  Aki on ckd 3b Developed AKI likely due to diuresis, cardiorenal physiology. Worsened today, holding diuretics Nephrology following. Gentle fluids today  Depression Continue Celexa 20 mg daily  Acute on chronic respiratory failure with hypoxia (HCC) Baseline on 4 L/min continuous O2. Today requiring 6 --Supplement O2 to keep sats 88-94% --Mgmt of underlying issues as outlined above  Carcinoma of overlapping sites of  left breast in female, estrogen receptor positive ([place]) Noted. No acute issues.        Subjective: says breathing a little worse today   Physical Exam: Vitals:   07/25/22 2334 07/26/22 0512 07/26/22 0755 07/26/22 1138  BP: 115/78 102/73 119/77 108/66  Pulse: (!) 105 70 (!) 53 73  Resp: 20 18 16 16   Temp: 97.6 F (36.4 C) 97.6 F (36.4 C) (!) 97.4 F (36.3 C) 97.6 F (36.4 C)  TempSrc:    Oral  SpO2: 91% 100% 96% 91%  Weight:      Height:       General exam: awake, alert, no acute distress HEENT: moist mucus membranes, hearing grossly normal  Respiratory system: rales at bases, normal wob, mild tachypnea Cardiovascular system: normal Q2/M6,  RRR, systolic murmur noted, no peripheral edema Gastrointestinal system: abdomen is soft, non-tender, no guarding or rebound Central nervous system: A&O x3. no gross focal neurologic deficits, normal speech Extremities: moves all, no edema, normal tone Psychiatry: normal mood, congruent affect, judgement and insight appear normal   Data Reviewed:  Notable labs: Creatinine 2.38>>2.77>> 2.53>>2.25>>1.80 >> 1.96 , BUN 38    Family Communication: None at bedside. Daughter updated telephonically 10/11.  Consults: Palliative Care Pulmonology Nephrology   Disposition: Status is: Inpatient Remains inpatient appropriate because: worsening kidney function Anticipate stable to d/c in 24-48 hrs pending improvement in kidney function   Planned Discharge Destination: Home with home health    Author: Desma Maxim, MD 07/26/2022 1:41 PM  For on call review [email or web address].

## 2022-07-26 NOTE — Progress Notes (Signed)
Nutrition Follow-up  DOCUMENTATION CODES:   Severe malnutrition in context of chronic illness  INTERVENTION:   -D/c calorie count -Continue Magic cup TID with meals, each supplement provides 290 kcal and 9 grams of protein  -Continue Boost Breeze po TID, each supplement provides 250 kcal and 9 grams of protein  -Continue MVI with minerals daily  NUTRITION DIAGNOSIS:   Severe Malnutrition related to chronic illness as evidenced by severe fat depletion, severe muscle depletion.  Ongoing  GOAL:   Patient will meet greater than or equal to 90% of their needs  Progressing   MONITOR:   PO intake, Supplement acceptance, Labs, Weight trends, Skin, I & O's  REASON FOR ASSESSMENT:   Consult Assessment of nutrition requirement/status  ASSESSMENT:   66 y.o. 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  Reviewed I/O's: +385 ml x 24 hours and 814 ml since admission  Spoke with pt at bedside, who was sitting up and playing solitaire on her phone at time of visit. She continues to complain of shortness of breath, which has been unchanged since yesterday. She shares that she does not have much of an appetite, but is trying to encourage herself to eat. Per pt, the modifications in meal times have helped. Pt consumed 100% of oatmeal, peaches, sausage patty 50% of magic Cup, and 100% of Boost Breeze today.   Calorie count:    Dinner 10/9: 100% tilapia, corn, collard greens and a roll    Total- 491kcal and 27g protein    Breakfast 10/10: 50% scrambled eggs, 50% [place] sausage, 100% pineapple, 100% milk    Total- 317kcal and 18g protein    Lunch 10/10: 100% vegetable soup, grilled cheese, white cake and peaches   Total- 481kcal and 13g protein    Total Intake:  1289kcal (92% estimated needs) and  58g protein (83% estimated needs)   10/10-10/11 Breakfast: 647 kcals, 20 grams protein Lunch: nothing available yet Dinner: 385 kcals, 14 grams protein Supplements: 2 Boost Breeze (500 kcals, 18 grams protein)   Total intake: 1532 kcal (100% of minimum estimated needs)  52 grams protein (74% of minimum estimated needs)   10/11-10/12 Breakfast: 623 kcals, 16 grams protein Lunch: 722 kcals, 20 grams protein Dinner: 582 kcals, 15 grams protein  Total intake: 1927 kcal (100% of minimum estimated needs)  51 grams protein (74% of minimum estimated needs)  Total average intake: 1583 kcals, 54 grams protein (100% of estimated kcal needs and 77% of estimated protein needs)  Reviewed calorie count results with pt. Pt is currently meeting 100% of needs, so do not recommend a feeding tube at this time. Per pt, she has talked to her daughter about it; she will still consider a feeding tube if it is needed, but is leaning towards not pursuing one now. RD educated importance of good oral intake in addition of implementing small, frequent meals RD affirmed that feeding tube placement is not an emergent need at this time, but encouraged having continued goals of care discussions with her daughter to  determine if this is something that she would truly want, especially if swallow function and PO intake start to decline. Per pt, she currently only has difficulty swallowing water.   Recommendations also discussed with MD.   Labs reviewed: CBGS: 116 (inpatient orders for glycemic control are none).    Diet Order:   Diet  Order             Diet regular Room service appropriate? Yes; Fluid consistency: Thin; Fluid restriction: 1800 mL Fluid  Diet effective now                   EDUCATION NEEDS:   Education needs have been addressed  Skin:  Skin Assessment: Reviewed RN Assessment  Last BM:  10/10- type 5  Height:   Ht Readings from Last 1 Encounters:  07/24/22 5\' 6"  (1.676 m)    Weight:   Wt Readings from Last 1 Encounters:  07/25/22 51.1 kg    Ideal Body Weight:  59 kg  BMI:  Body mass index is 18.18 kg/m.  Estimated Nutritional Needs:   Kcal:  1400-1600kcal/day  Protein:  70-80g/day  Fluid:  1.3-1.5L/day    Danielle [name], RD, LDN, [place] Registered Dietitian II Certified Diabetes Care and Education Specialist Please refer to [facility] for RD and/or RD on-call/weekend/after hours pager

## 2022-07-26 NOTE — Progress Notes (Signed)
PT Cancellation Note  Patient Details Name: Danielle [name] MRN: [id number] DOB: [birth year]   Cancelled Treatment:    Reason Eval/Treat Not Completed: Medical issues which prohibited therapy;Patient not medically ready (Per chart review/staff, pt with acute decline in respiratory status. Pt previous mobilizng in room on her own, no difficulty working with PT services from a strength/balance standpoint, but dyspnea limitations.) Will signoff at this time. Please reconsult as needed.   2:19 PM, 07/26/22 Etta Grandchild, PT, DPT Physical Therapist - [facility]  [phone] ([place])    Tawney Vanorman C 07/26/2022, 2:19 PM

## 2022-07-26 NOTE — Care Management Important Message (Signed)
Important Message  Patient Details  Name: Danielle [name] MRN: [id number] Date of Birth: 03-14-1956   Medicare Important Message Given:  Yes     [name] 07/26/2022, 2:07 PM

## 2022-07-26 NOTE — TOC Progression Note (Signed)
Transition of Care [facility]) - Progression Note    Patient Details  Name: Danielle [name] MRN: [id number] Date of Birth: 16-May-1956  Transition of Care [facility]) CM/SW [place], [place] Phone Number: 07/26/2022, 2:29 PM  Clinical Narrative:     CSW spoke with patient at bedside, she reports she did change her mind and is agreeable to try Home Health.   CSW has reached out to [place] with [facility] to see if they can accept for [facility] PT and RN, pending response.    Expected Discharge Plan: Home/Self Care Barriers to Discharge: Continued Medical Work up  Expected Discharge Plan and Services Expected Discharge Plan: Home/Self Care   Discharge Planning Services: CM Consult   Living arrangements for the past 2 months: Single Family Home                                       Social Determinants of Health (SDOH) Interventions    Readmission Risk Interventions    05/09/2022    3:11 PM 01/29/2022   11:36 AM 11/29/2021   10:49 AM  Readmission Risk Prevention Plan  Transportation Screening Complete Complete Complete  Medication Review Press photographer) Complete Complete Complete  PCP or Specialist appointment within 3-5 days of discharge  Complete Complete  HRI or Home Care Consult Patient refused  Complete  SW Recovery Care/Counseling Consult Complete Complete Complete  Palliative Care Screening Not Applicable Not Applicable Not [place] Not Applicable Not Applicable Not Applicable

## 2022-07-26 NOTE — Progress Notes (Signed)
OT Cancellation Note  Patient Details Name: Danielle [name] MRN: [id number] DOB: 02/01/1956   Cancelled Treatment:    Reason Eval/Treat Not Completed: Medical issues which prohibited therapy. Upon attempt, pt eating lunch, endorsing more SOB. SpO2 with pulse ox reading 81-83%, on 6L. Notified nursing who came to assess. Will hold OT this date and re-attempt once medically appropriate for exertional activity.   Ardeth Perfect., MPH, MS, OTR/L ascom [phone] 07/26/22, 2:17 PM

## 2022-07-26 NOTE — Progress Notes (Signed)
[facility]  ROUNDING NOTE   Subjective:   Danielle [name] 66 year old female with past medical conditions including diabetes, hypertension, lymphedema, breast cancer COPD and chronic kidney disease.  Patient presents to the emergency department of shortness of breath and has been admitted for Pleural effusion [J90] Status post thoracentesis [Z98.890] CHF exacerbation (HCC) [I50.9] Pleural effusion on right [J90] Acute on chronic combined systolic and diastolic CHF (congestive heart failure) (HCC) [I50.43] Acute on chronic respiratory failure with hypoxia ([place]) [J96.21]  Patient is known to our practice and is followed outpatient by Dr. Holley Raring.  She was last seen in office on April 19, 2022.    Update: Patient seen resting quietly, alert and oriented Appetite remains poor but improving Remains on 3 L nasal cannula complains of mild shortness of breath  Creatinine 2.32   Objective:  Vital signs in last 24 hours:  Temp:  [97.4 F (36.3 C)-98.2 F (36.8 C)] 97.6 F (36.4 C) (10/12 1138) Pulse Rate:  [53-105] 73 (10/12 1138) Resp:  [16-20] 16 (10/12 1138) BP: (94-119)/(65-80) 108/66 (10/12 1138) SpO2:  [85 %-100 %] 91 % (10/12 1138)  Weight change:  Filed Weights   07/23/22 0544 07/24/22 0539 07/25/22 0352  Weight: 54.4 kg 53.2 kg 51.1 kg    Intake/Output: I/O last 3 completed shifts: In: 385.4 [P.O.:195; I.V.:190.4] Out: -    Intake/Output this shift:  No intake/output data recorded.  Physical Exam: General: NAD, resting comfortably  Head: Normocephalic, atraumatic. Moist oral mucosal membranes  Eyes: Anicteric  Lungs:  Clear to auscultation, normal effort, [place] O2  Heart: Irregular rate and rhythm  Abdomen:  Soft, tender, nondistended  Extremities: Trace peripheral edema.  Neurologic: Nonfocal, moving all four extremities  Skin: No lesions  Access: None    Basic Metabolic Panel: Recent Labs  Lab 07/20/22 0418 07/21/22 2263 07/22/22 0528  07/23/22 0507 07/24/22 0435 07/25/22 0443 07/26/22 0409  NA 141 136 140 139 141 140 141  K 3.8 3.7 4.1 4.5 4.1 4.2 4.3  CL 105 104 109 109 106 105 110  CO2 25 26 27 26 26 25 26   GLUCOSE 84 82 91 92 86 93 97  BUN 50* 47* 48* 34* 38* 42* 46*  CREATININE 2.77* 2.53* 2.25* 1.80* 1.96* 2.27* 2.32*  CALCIUM 8.9 8.6* 8.5* 8.6* 9.1 8.9 8.5*  MG 1.7 1.6* 2.1  --  1.7 2.2  --   PHOS  --   --   --   --   --   --  6.0*     Liver Function Tests: Recent Labs  Lab 07/26/22 0409  ALBUMIN 3.1*    No results for input(s): "LIPASE", "AMYLASE" in the last 168 hours. No results for input(s): "AMMONIA" in the last 168 hours.  CBC: Recent Labs  Lab 07/20/22 0418 07/21/22 0632  WBC 3.6* 3.7*  HGB 9.5* 9.2*  HCT 28.7* 28.3*  MCV 94.1 94.0  PLT 195 177     Cardiac Enzymes: No results for input(s): "CKTOTAL", "CKMB", "CKMBINDEX", "TROPONINI" in the last 168 hours.  BNP: Invalid input(s): "POCBNP"  CBG: No results for input(s): "GLUCAP" in the last 168 hours.  Microbiology: Results for orders placed or performed during the hospital encounter of 07/17/2022  Resp Panel by RT-PCR (Flu A&B, Covid) Anterior Nasal Swab     Status: None   Collection Time: 08/02/2022  6:43 PM   Specimen: Anterior Nasal Swab  Result Value Ref Range Status   SARS Coronavirus 2 by RT PCR NEGATIVE NEGATIVE Final  Comment: (NOTE) SARS-CoV-2 target nucleic acids are NOT DETECTED.  The SARS-CoV-2 RNA is generally detectable in upper respiratory specimens during the acute phase of infection. The lowest concentration of SARS-CoV-2 viral copies this assay can detect is 138 copies/mL. A negative result does not preclude SARS-Cov-2 infection and should not be used as the sole basis for treatment or other patient management decisions. A negative result may occur with  improper specimen collection/handling, submission of specimen other than nasopharyngeal swab, presence of viral mutation(s) within the areas targeted  by this assay, and inadequate number of viral copies(<138 copies/mL). A negative result must be combined with clinical observations, patient history, and epidemiological information. The expected result is Negative.  Fact Sheet for Patients:  EntrepreneurPulse.com.au  Fact Sheet for Healthcare Providers:  IncredibleEmployment.be  This test is no t yet approved or cleared by the [place] FDA and  has been authorized for detection and/or diagnosis of SARS-CoV-2 by FDA under an Emergency Use Authorization (EUA). This EUA will remain  in effect (meaning this test can be used) for the duration of the COVID-19 declaration under Section 564(b)(1) of the Act, 21 U.S.C.section 360bbb-3(b)(1), unless the authorization is terminated  or revoked sooner.       Influenza A by PCR NEGATIVE NEGATIVE Final   Influenza B by PCR NEGATIVE NEGATIVE Final    Comment: (NOTE) The Xpert Xpress SARS-CoV-2/FLU/RSV plus assay is intended as an aid in the diagnosis of influenza from Nasopharyngeal swab specimens and should not be used as a sole basis for treatment. Nasal washings and aspirates are unacceptable for Xpert Xpress SARS-CoV-2/FLU/RSV testing.  Fact Sheet for Patients: EntrepreneurPulse.com.au  Fact Sheet for Healthcare Providers: IncredibleEmployment.be  This test is not yet approved or cleared by the [place] FDA and has been authorized for detection and/or diagnosis of SARS-CoV-2 by FDA under an Emergency Use Authorization (EUA). This EUA will remain in effect (meaning this test can be used) for the duration of the COVID-19 declaration under Section 564(b)(1) of the Act, 21 U.S.C. section 360bbb-3(b)(1), unless the authorization is terminated or revoked.  Performed at [facility], [address] [place], [place] [address]     Coagulation Studies: No results for input(s): "LABPROT", "INR" in  the last 72 hours.  Urinalysis: No results for input(s): "COLORURINE", "LABSPEC", "PHURINE", "GLUCOSEU", "HGBUR", "BILIRUBINUR", "KETONESUR", "PROTEINUR", "UROBILINOGEN", "NITRITE", "LEUKOCYTESUR" in the last 72 hours.  Invalid input(s): "APPERANCEUR"    Imaging: No results found.   Medications:    sodium chloride 40 mL/hr at 07/25/22 1141    amiodarone  200 mg Oral Daily   apixaban  2.5 mg Oral BID   atorvastatin  40 mg Oral Daily   citalopram  20 mg Oral Daily   feeding supplement  1 Container Oral TID BM   fluticasone furoate-vilanterol  1 puff Inhalation Daily   And   umeclidinium bromide  1 puff Inhalation Daily   metoprolol tartrate  12.5 mg Oral BID   midodrine  5 mg Oral TID WC   multivitamin with minerals  1 tablet Oral Daily   pantoprazole  40 mg Oral BID AC   polyethylene glycol  17 g Oral Daily   sodium bicarbonate  1,300 mg Oral Daily   sodium chloride flush  3 mL Intravenous Q12H   acetaminophen **OR** acetaminophen, albuterol, calcium carbonate, HYDROcodone-acetaminophen, simethicone  Assessment/ Plan:  Danielle [name] is a 66 y.o.  female with past medical conditions including diabetes, hypertension, lymphedema, breast cancer COPD and chronic kidney  disease.  Patient presents to the emergency department of shortness of breath and has been admitted for Pleural effusion [J90] Status post thoracentesis [Z98.890] CHF exacerbation ([place]) [I50.9] Pleural effusion on right [J90] Acute on chronic combined systolic and diastolic CHF (congestive heart failure) (HCC) [I50.43] Acute on chronic respiratory failure with hypoxia (HCC) [J96.21]   Acute Kidney Injury on chronic kidney disease stage IIIb with baseline creatinine 1.50 and GFR of 38 on 06/16/22.  Acute kidney injury secondary to cardiorenal syndrome and diuretic therapy Chronic kidney disease is secondary to hypertension and CHF.  No IV contrast exposure.    Creatinine rose slightly overnight.  We will  stop IV fluids due to mild lower extremity edema.  Continue to hold diuretics at this time.  No acute need for dialysis but will monitor closely.  Lab Results  Component Value Date   CREATININE 2.32 (H) 07/26/2022   CREATININE 2.27 (H) 07/25/2022   CREATININE 1.96 (H) 07/24/2022    Intake/Output Summary (Last 24 hours) at 07/26/2022 1253 Last data filed at 07/25/2022 2151 Gross per 24 hour  Intake 310.44 ml  Output --  Net 310.44 ml     2. Anemia of chronic kidney disease Lab Results  Component Value Date   HGB 9.2 (L) 07/21/2022    Hemoglobin remains stable  3.  Hypertension with chronic kidney disease.  Home regimen includes torsemide, amiodarone, hydralazine, and isosorbide. Continue Midodrine 5mg  three times daily for BP support.  Hydralazine and diuretics held.  4.  Pleural effusions, recurrent.  Seen on chest x-ray on admission.  Radiology performed right thoracentesis on admission with 900 mL removed.   LOS: [place] 10/12/202312:53 PM

## 2022-07-26 NOTE — Progress Notes (Signed)
PULMONOLOGY         Date: 07/26/2022,   MRN# [id number] Danielle [name] [birth year]     AdmissionWeight: 50.3 kg                 CurrentWeight: 51.1 kg  Referring provider: Dr Arbutus Ped   CHIEF COMPLAINT:   Recurrent pleural effusions and advanced metastatic cancer.    HISTORY OF PRESENT ILLNESS   This is a very pleasant 66yo F with hx of advanced COPD, chronic anemia, paroxysmal AFchronic anticoagulation therapy, history of chronic systolic heart failure, history of adenocarcinoma of the right lung status post radiation therapy, history of stage IIIb chronic kidney disease, aortic valve stenosis, COPD with chronic respiratory failure on 4 L of oxygen continuous, history of left breast cancer status post mastectomy, history of esophageal cancer recurrent right-sided pleural effusion requiring thoracentesis Most recently on 06/19/2022 who presents to the ER for evaluation of worsening shortness of breath from her baseline.  She has associated chest tightness. She denies having mucus on expectoration, she is on 3-4L/min during my evaluation and notes when attempting to ambulate she has dyspnea.   07/24/22- patient is mildly imprved with bronchopulmonary hygiene .  Continue current care and may initiate DC planning.   07/25/22- no acute events overnight continue current care plan.  She is close to baseline.   07/26/22- patient reports slight worsening and is now on 7L/min.    PAST MEDICAL HISTORY   Past Medical History:  Diagnosis Date   Acute respiratory failure due to COVID-19 ([place]) 09/27/2019   Anemia    Anxiety    Aortic atherosclerosis ([place])    Aortic valve stenosis 02/10/2018   a.) TTE 02/10/2018: EF 55-60%: mild AS with MPG of 12 mmHg. b.) TTE 04/21/2018: EF 35-40%; mild AS with MPG 8 mmHg. c.) TTE 11/07/2019: EF 50-55%; mild AS with MPG 10 mmHg.   Arthritis    Atrial fibrillation (HCC)    a.) CHA2DS2-VASc = 4 (age, sex, HTN, aortic plaque). b.) Rate/rhythm  maintained on oral carvedilol; chronically anticoagulated on dose reduced apixaban. c.)  Attempted deployment of LAA occlusive device on 01/11/2021; parameters failed and procedure aborted.   Breast cancer, left ([place]) 2000   a.) T2N1M0; ER/PR (+) --> Tx'd with total mastectomy, LN resection, XRT, and chemotherapy   Cancer of right lung ([place]) 07/30/2016   a.) adenocarcinoma; ALK, ROS1, PDL1, BRAF, EGFR all negative.   CHF (congestive heart failure) (HCC)    Chronic diastolic CHF (congestive heart failure) ([place]) 11/26/2021   CKD (chronic kidney disease), stage IV (HCC)    COPD (chronic obstructive pulmonary disease) (HCC)    Dependence on supplemental oxygen    Depression    Diastolic dysfunction 07/21/1218   a.) TTE 02/10/2017: EF 55-60%; G2DD. b.) TTE 04/21/2018: EF 35-40%; mild LA dilation; mod MV regurgitation. c.) TTE 11/07/2019: EF 50-55%; G1DD.   DOE (dyspnea on exertion)    GERD (gastroesophageal reflux disease)    Heart murmur    History of 2019 novel coronavirus disease (COVID-19) 10/14/2019   HLD (hyperlipidemia)    Hypertension    Long term current use of anticoagulant    a.) apixaban   Lymphedema    Personal history of chemotherapy    Personal history of radiation therapy    Respiratory tract infection due to COVID-19 virus 09/27/2019   SBO (small bowel obstruction) ([place]) 11/05/2020   Vitamin D deficiency      SURGICAL HISTORY   Past Surgical History:  Procedure Laterality Date   Breast Biospy Left    [facility]   BREAST SURGERY     CARDIOVERSION N/A 02/23/2022   Procedure: CARDIOVERSION;  Surgeon: Corey Skains, MD;  Location: [facility] ORS;  Service: Cardiovascular;  Laterality: N/A;   COLONOSCOPY N/A 04/30/2018   Procedure: COLONOSCOPY;  Surgeon: Virgel Manifold, MD;  Location: [facility] ENDOSCOPY;  Service: Endoscopy;  Laterality: N/A;   COLONOSCOPY N/A 07/22/2018   Procedure: COLONOSCOPY;  Surgeon: Virgel Manifold, MD;  Location: [facility] ENDOSCOPY;  Service:  Endoscopy;  Laterality: N/A;   COLONOSCOPY WITH PROPOFOL N/A 09/21/2021   Procedure: COLONOSCOPY WITH PROPOFOL;  Surgeon: Benjamine Sprague, DO;  Location: [facility] END[facility]COPY;  Service: General;  Laterality: N/A;   COLONOSCOPY WITH PROPOFOL N/A 03/11/2022   Procedure: COLONOSCOPY WITH PROPOFOL;  Surgeon: Annamaria Helling, DO;  Location: [facility] ENDOSCOPY;  Service: Gastroenterology;  Laterality: N/A;   DILATION AND CURETTAGE OF UTERUS     ELECTROMAGNETIC NAVIGATION BROCHOSCOPY Right 04/11/2016   Procedure: ELECTROMAGNETIC NAVIGATION BRONCHOSCOPY;  Surgeon: Vilinda Boehringer, MD;  Location: [facility] ORS;  Service: Cardiopulmonary;  Laterality: Right;   ESOPHAGOGASTRODUODENOSCOPY N/A 07/22/2018   Procedure: ESOPHAGOGASTRODUODENOSCOPY (EGD);  Surgeon: Virgel Manifold, MD;  Location: [facility] ENDOSCOPY;  Service: Endoscopy;  Laterality: N/A;   ESOPHAGOGASTRODUODENOSCOPY (EGD) WITH PROPOFOL N/A 05/07/2018   Procedure: ESOPHAGOGASTRODUODENOSCOPY (EGD) WITH PROPOFOL;  Surgeon: Lucilla Lame, MD;  Location: [facility] ENDOSCOPY;  Service: Endoscopy;  Laterality: N/A;   ESOPHAGOGASTRODUODENOSCOPY (EGD) WITH PROPOFOL N/A 04/24/2019   Procedure: ESOPHAGOGASTRODUODENOSCOPY (EGD) WITH PROPOFOL;  Surgeon: Jonathon Bellows, MD;  Location: [facility] ENDOSCOPY;  Service: Gastroenterology;  Laterality: N/A;   ESOPHAGOGASTRODUODENOSCOPY (EGD) WITH PROPOFOL N/A 01/12/2020   Procedure: ESOPHAGOGASTRODUODENOSCOPY (EGD) WITH PROPOFOL;  Surgeon: Jonathon Bellows, MD;  Location: [facility] ENDOSCOPY;  Service: Gastroenterology;  Laterality: N/A;   ESOPHAGOGASTRODUODENOSCOPY (EGD) WITH PROPOFOL N/A 04/28/2020   Procedure: ESOPHAGOGASTRODUODENOSCOPY (EGD) WITH PROPOFOL;  Surgeon: Jonathon Bellows, MD;  Location: [facility] ENDOSCOPY;  Service: Gastroenterology;  Laterality: N/A;   ESOPHAGOGASTRODUODENOSCOPY (EGD) WITH PROPOFOL N/A 03/11/2022   Procedure: ESOPHAGOGASTRODUODENOSCOPY (EGD) WITH PROPOFOL;  Surgeon: Annamaria Helling, DO;  Location: [facility] ENDOSCOPY;  Service:  Gastroenterology;  Laterality: N/A;   EUS N/A 05/07/2019   Procedure: FULL UPPER ENDOSCOPIC ULTRASOUND (EUS) RADIAL;  Surgeon: Jola Schmidt, MD;  Location: [facility] ENDOSCOPY;  Service: Endoscopy;  Laterality: N/A;   ILEOSCOPY N/A 07/22/2018   Procedure: ILEOSCOPY THROUGH STOMA;  Surgeon: Virgel Manifold, MD;  Location: [facility] ENDOSCOPY;  Service: Endoscopy;  Laterality: N/A;   ILEOSTOMY     ILEOSTOMY N/A 09/08/2018   Procedure: ILEOSTOMY REVISION POSSIBLE CREATION;  Surgeon: Herbert Pun, MD;  Location: [facility] ORS;  Service: General;  Laterality: N/A;   ILEOSTOMY CLOSURE N/A 08/15/2018   Procedure: DILATION OF ILEOSTOMY STRICTURE;  Surgeon: Herbert Pun, MD;  Location: [facility] ORS;  Service: General;  Laterality: N/A;   LAPAROTOMY Right 05/04/2018   Procedure: EXPLORATORY LAPAROTOMY right colectomy right and left ostomy;  Surgeon: Herbert Pun, MD;  Location: [facility] ORS;  Service: General;  Laterality: Right;   LEFT ATRIAL APPENDAGE OCCLUSION N/A 01/11/2021   Procedure: LEFT ATRIAL APPENDAGE OCCLUSION ([place]); ABORTED PROCEDURE WITHOUT DEVICE BEING IMPLANTED; Location: [facility]; Surgeon: Mylinda Latina, MD   LUNG BIOPSY     MASTECTOMY Left    2000, [facility]   ROTATOR CUFF REPAIR Right    [place] TAKEDOWN N/A 10/23/2021   Procedure: XI ROBOTIC ASSISTED ILEOSTOMY TAKEDOWN;  Surgeon: Herbert Pun, MD;  Location: [facility] ORS;  Service: General;  Laterality: N/A;  180 minutes for the surgery part please     FAMILY HISTORY   Family History  Problem Relation Age of Onset   Breast cancer Mother 51   Cancer Mother        Breast    Cirrhosis Father    Breast cancer Paternal Aunt 68   Cancer Maternal Aunt        Breast      SOCIAL HISTORY   Social History   Tobacco Use   Smoking status: Former    Packs/day: 0.50    Years: 20.00    Total pack years: 10.00    Types: Cigarettes    Quit date: 07/02/2012    Years since quitting:  10.0   Smokeless tobacco: Current    Types: Snuff  Vaping Use   Vaping Use: Never used  Substance Use Topics   Alcohol use: Yes    Alcohol/week: 3.0 standard drinks of alcohol    Types: 3 Cans of beer per week    Comment: Occasionally beer   Drug use: No     MEDICATIONS    Home Medication:    Current Medication:  Current Facility-Administered Medications:    acetaminophen (TYLENOL) tablet 650 mg, 650 mg, Oral, Q6H PRN, 650 mg at 07/26/22 1423 **OR** acetaminophen (TYLENOL) suppository 650 mg, 650 mg, Rectal, Q6H PRN, Athena Masse, MD   albuterol (PROVENTIL) (2.5 MG/3ML) 0.083% nebulizer solution 2.5 mg, 2.5 mg, Inhalation, BID, Lanney Gins, [place], MD   amiodarone (PACERONE) tablet 200 mg, 200 mg, Oral, Daily, Judd Gaudier V, MD, 200 mg at 07/26/22 7253   apixaban (ELIQUIS) tablet 2.5 mg, 2.5 mg, Oral, BID, Judd Gaudier V, MD, 2.5 mg at 07/26/22 6644   atorvastatin (LIPITOR) tablet 40 mg, 40 mg, Oral, Daily, Judd Gaudier V, MD, 40 mg at 07/26/22 0347   calcium carbonate (TUMS - dosed in mg elemental calcium) chewable tablet 200 mg of elemental calcium, 1 tablet, Oral, TID PRN, Nicole Kindred A, DO, 200 mg of elemental calcium at 07/24/22 2203   citalopram (CELEXA) tablet 20 mg, 20 mg, Oral, Daily, Nicole Kindred A, DO, 20 mg at 07/26/22 0811   feeding supplement (BOOST / RESOURCE BREEZE) liquid 1 Container, 1 Container, Oral, TID BM, Nicole Kindred A, DO, 1 Container at 07/26/22 0812   fluticasone furoate-vilanterol (BREO ELLIPTA) 100-25 MCG/ACT 1 puff, 1 puff, Inhalation, Daily, 1 puff at 07/26/22 0813 **AND** umeclidinium bromide (INCRUSE ELLIPTA) 62.5 MCG/ACT 1 puff, 1 puff, Inhalation, Daily, Judd Gaudier V, MD, 1 puff at 07/26/22 0813   HYDROcodone-acetaminophen (NORCO/VICODIN) 5-325 MG per tablet 1-2 tablet, 1-2 tablet, Oral, Q4H PRN, Athena Masse, MD, 2 tablet at 07/25/22 2159   metoprolol tartrate (LOPRESSOR) tablet 12.5 mg, 12.5 mg, Oral, BID, Lanney Gins, [place] Ciavarella, MD,  12.5 mg at 07/26/22 0811   midodrine (PROAMATINE) tablet 5 mg, 5 mg, Oral, TID WC, Nicole Kindred A, DO, 5 mg at 07/26/22 1117   multivitamin with minerals tablet 1 tablet, 1 tablet, Oral, Daily, Nicole Kindred A, DO, 1 tablet at 07/26/22 0811   pantoprazole (PROTONIX) EC tablet 40 mg, 40 mg, Oral, BID AC, Judd Gaudier V, MD, 40 mg at 07/26/22 0807   polyethylene glycol (MIRALAX / GLYCOLAX) packet 17 g, 17 g, Oral, Daily, Breeze, Shantelle, NP, 17 g at 07/25/22 0909   simethicone (MYLICON) 40 QQ/5.9DG suspension 40 mg, 40 mg, Oral, QID PRN, Oswald Hillock, RPH, 40 mg at 07/21/22 2249   sodium bicarbonate tablet 1,300 mg, 1,300 mg, Oral, Daily, Athena Masse,  MD, 1,300 mg at 07/26/22 0811   sodium chloride flush (NS) 0.9 % injection 3 mL, 3 mL, Intravenous, Q12H, Nicole Kindred A, DO, 3 mL at 07/26/22 1117    ALLERGIES   Patient has no known allergies.     REVIEW OF SYSTEMS    Review of Systems:  Gen:  Denies  fever, sweats, chills weigh loss  HEENT: Denies blurred vision, double vision, ear pain, eye pain, hearing loss, nose bleeds, sore throat Cardiac:  No dizziness, chest pain or heaviness, chest tightness,edema Resp:   reports dyspnea chronically  Gi: Denies swallowing difficulty, stomach pain, nausea or vomiting, diarrhea, constipation, bowel incontinence Gu:  Denies bladder incontinence, burning urine Ext:   Denies Joint pain, stiffness or swelling Skin: Denies  skin rash, easy bruising or bleeding or hives Endoc:  Denies polyuria, polydipsia , polyphagia or weight change Psych:   Denies depression, insomnia or hallucinations   Other:  All other systems negative   VS: BP 111/79 (BP Location: Right Arm)   Pulse 70   Temp 97.7 F (36.5 C) (Oral)   Resp 18   Ht $R'5\' 6"'DH$  (1.676 m)   Wt 51.1 kg   SpO2 96%   BMI 18.18 kg/m      PHYSICAL EXAM    GENERAL:NAD, no fevers, chills, no weakness no fatigue HEAD: Normocephalic, atraumatic.  EYES: Pupils equal, round,  reactive to light. Extraocular muscles intact. No scleral icterus.  MOUTH: Moist mucosal membrane. Dentition intact. No abscess noted.  EAR, NOSE, THROAT: Clear without exudates. No external lesions.  NECK: Supple. No thyromegaly. No nodules. No JVD.  PULMONARY: decreased breath sounds with mild rhonchi worse at bases bilaterally.  CARDIOVASCULAR: S1 and S2. Regular rate and rhythm. No murmurs, rubs, or gallops. No edema. Pedal pulses 2+ bilaterally.  GASTROINTESTINAL: Soft, nontender, nondistended. No masses. Positive bowel sounds. No hepatosplenomegaly.  MUSCULOSKELETAL: No swelling, clubbing, or edema. Range of motion full in all extremities.  NEUROLOGIC: Cranial nerves II through XII are intact. No gross focal neurological deficits. Sensation intact. Reflexes intact.  SKIN: No ulceration, lesions, rashes, or cyanosis. Skin warm and dry. Turgor intact.  PSYCHIATRIC: Mood, affect within normal limits. The patient is awake, alert and oriented x 3. Insight, judgment intact.       IMAGING     ASSESSMENT/PLAN   Acute on chronic hypoxemic respiratory failure -patient with advanced COPD/CHF/CKD/lung cancer -she is with a least moderate severity physical deconditioning -all of these comorbidities are currently playing role in dyspnea -Advanced COPD - continue ICS/LABA/LAMA with Breo Ellipta and Incruse and addition of PRN albuterol nebulizer  -CHF/CKD overlap- adding aldactone 12.5 and reducing metoprolol from 25 to 12.5 bid to allow hemodynamic leveling with midodrine and Atrial fibrillation while diuresing 3. Atelectasis - worse at RLL - Metaneb with saline q8h 4. Right pleural effusion -  adding more diuresis, holding thoracentesis due to high likelyhood of rapid re-accumulation 5. Physical deconditioning- patient is currently with PT/OT and would benefit from same on outpatient 6. Atrial fibrillation - chronic stable - continue eliquis, metoprolol, amiodarone          Thank you  for allowing me to participate in the care of this patient.   Patient/Family are satisfied with care plan and all questions have been answered.    Provider disclosure: Patient with at least one acute or chronic illness or injury that poses a threat to life or bodily function and is being managed actively during this encounter.  All of the below services  have been performed independently by signing provider:  review of prior documentation from internal and or external health records.  Review of previous and current lab results.  Interview and comprehensive assessment during patient visit today. Review of current and previous chest radiographs/CT scans. Discussion of management and test interpretation with health care team and patient/family.   This document was prepared using Dragon voice recognition software and may include unintentional dictation errors.     Ottie Glazier, M.D.  Division of Pulmonary & Critical Care Medicine

## 2022-07-27 ENCOUNTER — Ambulatory Visit: Payer: Medicare Other | Admitting: Family

## 2022-07-27 DIAGNOSIS — I5043 Acute on chronic combined systolic (congestive) and diastolic (congestive) heart failure: Secondary | ICD-10-CM | POA: Diagnosis not present

## 2022-07-27 DIAGNOSIS — Z7189 Other specified counseling: Secondary | ICD-10-CM | POA: Diagnosis not present

## 2022-07-27 LAB — BLOOD GAS, ARTERIAL
Acid-base deficit: 0.6 mmol/L (ref 0.0–2.0)
Bicarbonate: 25.4 mmol/L (ref 20.0–28.0)
O2 Content: 12 L/min
O2 Saturation: 96.4 %
Patient temperature: 37
pCO2 arterial: 46 mmHg (ref 32–48)
pH, Arterial: 7.35 (ref 7.35–7.45)
pO2, Arterial: 74 mmHg — ABNORMAL LOW (ref 83–108)

## 2022-07-27 LAB — BASIC METABOLIC PANEL
Anion gap: 9 (ref 5–15)
BUN: 47 mg/dL — ABNORMAL HIGH (ref 8–23)
CO2: 25 mmol/L (ref 22–32)
Calcium: 9.1 mg/dL (ref 8.9–10.3)
Chloride: 109 mmol/L (ref 98–111)
Creatinine, Ser: 2.13 mg/dL — ABNORMAL HIGH (ref 0.44–1.00)
GFR, Estimated: 25 mL/min — ABNORMAL LOW (ref >60)
Glucose, Bld: 86 mg/dL (ref 70–99)
Potassium: 4.1 mmol/L (ref 3.5–5.1)
Sodium: 143 mmol/L (ref 135–145)

## 2022-07-27 MED ORDER — GUAIFENESIN ER 600 MG PO TB12
1200.0000 mg | ORAL_TABLET | Freq: Two times a day (BID) | ORAL | Status: DC | PRN
  Administered 2022-07-27 – 2022-08-17 (×16): 1200 mg via ORAL
  Filled 2022-07-27 (×16): qty 2

## 2022-07-27 MED ORDER — METOPROLOL TARTRATE 25 MG PO TABS
25.0000 mg | ORAL_TABLET | Freq: Two times a day (BID) | ORAL | Status: DC
  Administered 2022-07-27 – 2022-07-29 (×5): 25 mg via ORAL
  Filled 2022-07-27 (×5): qty 1

## 2022-07-27 MED ORDER — FUROSEMIDE 10 MG/ML IJ SOLN
40.0000 mg | Freq: Once | INTRAMUSCULAR | Status: AC
  Administered 2022-07-27: 40 mg via INTRAVENOUS
  Filled 2022-07-27: qty 4

## 2022-07-27 MED ORDER — TORSEMIDE 20 MG PO TABS
20.0000 mg | ORAL_TABLET | Freq: Every day | ORAL | Status: DC
  Administered 2022-07-27 – 2022-07-29 (×3): 20 mg via ORAL
  Filled 2022-07-27 (×3): qty 1

## 2022-07-27 NOTE — Progress Notes (Signed)
OT Cancellation Note  Patient Details Name: Danielle [name] MRN: [id number] DOB: 03/22/1956   Cancelled Treatment:    Reason Eval/Treat Not Completed: Other (comment). RN cleared pt to work with OT. Upon attempt, pt endorsing headache that RN recently addressed. She then requested privacy to answer phone call. Will re-attempt OT tx at later date/time as schedule permits and as pt is medically appropriate.   Ardeth Perfect., MPH, MS, OTR/L ascom [phone] 07/27/22, 10:19 AM

## 2022-07-27 NOTE — Progress Notes (Signed)
PULMONOLOGY         Date: 07/27/2022,   MRN# [id number] Danielle [name] 07-31-1956     AdmissionWeight: 50.3 kg                 CurrentWeight: 51.2 kg  Referring provider: Dr Arbutus Ped   CHIEF COMPLAINT:   Recurrent pleural effusions and advanced metastatic cancer.    HISTORY OF PRESENT ILLNESS   This is a very pleasant 66yo F with hx of advanced COPD, chronic anemia, paroxysmal AFchronic anticoagulation therapy, history of chronic systolic heart failure, history of adenocarcinoma of the right lung status post radiation therapy, history of stage IIIb chronic kidney disease, aortic valve stenosis, COPD with chronic respiratory failure on 4 L of oxygen continuous, history of left breast cancer status post mastectomy, history of esophageal cancer recurrent right-sided pleural effusion requiring thoracentesis Most recently on 06/19/2022 who presents to the ER for evaluation of worsening shortness of breath from her baseline.  She has associated chest tightness. She denies having mucus on expectoration, she is on 3-4L/min during my evaluation and notes when attempting to ambulate she has dyspnea.   07/24/22- patient is mildly imprved with bronchopulmonary hygiene .  Continue current care and may initiate DC planning.   07/25/22- no acute events overnight continue current care plan.  She is close to baseline.   07/26/22- patient reports slight worsening and is now on 7L/min.    07/27/22-  patient still not improved with cxr showing ongoing edema and effusions. I have stopped her metoprolol and midodrine due to drug/drug interaction and have ordered torsemide to diurese her despite CKD.  She is overall chronically ill with severe comorbid conditions. She has palliative care consultation ordered but remains full code which seems too aggressive for her current state of health.   PAST MEDICAL HISTORY   Past Medical History:  Diagnosis Date   Acute respiratory failure due to COVID-19  ([place]) 09/27/2019   Anemia    Anxiety    Aortic atherosclerosis ([place])    Aortic valve stenosis 02/10/2018   a.) TTE 02/10/2018: EF 55-60%: mild AS with MPG of 12 mmHg. b.) TTE 04/21/2018: EF 35-40%; mild AS with MPG 8 mmHg. c.) TTE 11/07/2019: EF 50-55%; mild AS with MPG 10 mmHg.   Arthritis    Atrial fibrillation (HCC)    a.) CHA2DS2-VASc = 4 (age, sex, HTN, aortic plaque). b.) Rate/rhythm maintained on oral carvedilol; chronically anticoagulated on dose reduced apixaban. c.)  Attempted deployment of LAA occlusive device on 01/11/2021; parameters failed and procedure aborted.   Breast cancer, left ([place]) 2000   a.) T2N1M0; ER/PR (+) --> Tx'd with total mastectomy, LN resection, XRT, and chemotherapy   Cancer of right lung ([place]) 07/30/2016   a.) adenocarcinoma; ALK, ROS1, PDL1, BRAF, EGFR all negative.   CHF (congestive heart failure) (HCC)    Chronic diastolic CHF (congestive heart failure) ([place]) 11/26/2021   CKD (chronic kidney disease), stage IV (HCC)    COPD (chronic obstructive pulmonary disease) (HCC)    Dependence on supplemental oxygen    Depression    Diastolic dysfunction 69/48/5462   a.) TTE 02/10/2017: EF 55-60%; G2DD. b.) TTE 04/21/2018: EF 35-40%; mild LA dilation; mod MV regurgitation. c.) TTE 11/07/2019: EF 50-55%; G1DD.   DOE (dyspnea on exertion)    GERD (gastroesophageal reflux disease)    Heart murmur    History of 2019 novel coronavirus disease (COVID-19) 10/14/2019   HLD (hyperlipidemia)    Hypertension    Long  term current use of anticoagulant    a.) apixaban   Lymphedema    Personal history of chemotherapy    Personal history of radiation therapy    Respiratory tract infection due to COVID-19 virus 09/27/2019   SBO (small bowel obstruction) ([place]) 11/05/2020   Vitamin D deficiency      SURGICAL HISTORY   Past Surgical History:  Procedure Laterality Date   Breast Biospy Left    [facility]   BREAST SURGERY     CARDIOVERSION N/A 02/23/2022   Procedure:  CARDIOVERSION;  Surgeon: Corey Skains, MD;  Location: [facility] ORS;  Service: Cardiovascular;  Laterality: N/A;   COLONOSCOPY N/A 04/30/2018   Procedure: COLONOSCOPY;  Surgeon: Virgel Manifold, MD;  Location: [facility] ENDOSCOPY;  Service: Endoscopy;  Laterality: N/A;   COLONOSCOPY N/A 07/22/2018   Procedure: COLONOSCOPY;  Surgeon: Virgel Manifold, MD;  Location: [facility] ENDOSCOPY;  Service: Endoscopy;  Laterality: N/A;   COLONOSCOPY WITH PROPOFOL N/A 09/21/2021   Procedure: COLONOSCOPY WITH PROPOFOL;  Surgeon: Benjamine Sprague, DO;  Location: [facility] ENDOSCOPY;  Service: General;  Laterality: N/A;   COLONOSCOPY WITH PROPOFOL N/A 03/11/2022   Procedure: COLONOSCOPY WITH PROPOFOL;  Surgeon: Annamaria Helling, DO;  Location: [facility]COPY;  Service: Gastroenterology;  Laterality: N/A;   DILATION AND CURETTAGE OF UTERUS     ELECTROMAGNETIC NAVIGATION BROCHOSCOPY Right 04/11/2016   Procedure: ELECTROMAGNETIC NAVIGATION BRONCHOSCOPY;  Surgeon: Vilinda Boehringer, MD;  Location: [facility] ORS;  Service: Cardiopulmonary;  Laterality: Right;   ESOPHAGOGASTRODUODENOSCOPY N/A 07/22/2018   Procedure: ESOPHAGOGASTRODUODENOSCOPY (EGD);  Surgeon: Virgel Manifold, MD;  Location: [facility] ENDOSCOPY;  Service: Endoscopy;  Laterality: N/A;   ESOPHAGOGASTRODUODENOSCOPY (EGD) WITH PROPOFOL N/A 05/07/2018   Procedure: ESOPHAGOGASTRODUODENOSCOPY (EGD) WITH PROPOFOL;  Surgeon: Lucilla Lame, MD;  Location: [facility] ENDOSCOPY;  Service: Endoscopy;  Laterality: N/A;   ESOPHAGOGASTRODUODENOSCOPY (EGD) WITH PROPOFOL N/A 04/24/2019   Procedure: ESOPHAGOGASTRODUODENOSCOPY (EGD) WITH PROPOFOL;  Surgeon: Jonathon Bellows, MD;  Location: [facility] ENDOSCOPY;  Service: Gastroenterology;  Laterality: N/A;   ESOPHAGOGASTRODUODENOSCOPY (EGD) WITH PROPOFOL N/A 01/12/2020   Procedure: ESOPHAGOGASTRODUODENOSCOPY (EGD) WITH PROPOFOL;  Surgeon: Jonathon Bellows, MD;  Location: [facility] ENDOSCOPY;  Service: Gastroenterology;  Laterality: N/A;    ESOPHAGOGASTRODUODENOSCOPY (EGD) WITH PROPOFOL N/A 04/28/2020   Procedure: ESOPHAGOGASTRODUODENOSCOPY (EGD) WITH PROPOFOL;  Surgeon: Jonathon Bellows, MD;  Location: [facility] ENDOSCOPY;  Service: Gastroenterology;  Laterality: N/A;   ESOPHAGOGASTRODUODENOSCOPY (EGD) WITH PROPOFOL N/A 03/11/2022   Procedure: ESOPHAGOGASTRODUODENOSCOPY (EGD) WITH PROPOFOL;  Surgeon: Annamaria Helling, DO;  Location: [facility] ENDOSCOPY;  Service: Gastroenterology;  Laterality: N/A;   EUS N/A 05/07/2019   Procedure: FULL UPPER ENDOSCOPIC ULTRASOUND (EUS) RADIAL;  Surgeon: Jola Schmidt, MD;  Location: [facility] ENDOSCOPY;  Service: Endoscopy;  Laterality: N/A;   ILEOSCOPY N/A 07/22/2018   Procedure: ILEOSCOPY THROUGH STOMA;  Surgeon: Virgel Manifold, MD;  Location: [facility] ENDOSCOPY;  Service: Endoscopy;  Laterality: N/A;   ILEOSTOMY     ILEOSTOMY N/A 09/08/2018   Procedure: ILEOSTOMY REVISION POSSIBLE CREATION;  Surgeon: Herbert Pun, MD;  Location: [facility] ORS;  Service: General;  Laterality: N/A;   ILEOSTOMY CLOSURE N/A 08/15/2018   Procedure: DILATION OF ILEOSTOMY STRICTURE;  Surgeon: Herbert Pun, MD;  Location: [facility] ORS;  Service: General;  Laterality: N/A;   LAPAROTOMY Right 05/04/2018   Procedure: EXPLORATORY LAPAROTOMY right colectomy right and left ostomy;  Surgeon: Herbert Pun, MD;  Location: [facility] ORS;  Service: General;  Laterality: Right;   LEFT ATRIAL APPENDAGE OCCLUSION N/A 01/11/2021   Procedure: LEFT ATRIAL APPENDAGE OCCLUSION ([place]); ABORTED PROCEDURE WITHOUT DEVICE BEING  IMPLANTED; Location: [facility]; Surgeon: Mylinda Latina, MD   LUNG BIOPSY     MASTECTOMY Left    2000, [place] Right    [place]   XI ROBOTIC ASSISTED COLOSTOMY TAKEDOWN N/A 10/23/2021   Procedure: XI ROBOTIC ASSISTED ILEOSTOMY TAKEDOWN;  Surgeon: Herbert Pun, MD;  Location: [facility] ORS;  Service: General;  Laterality: N/A;  180 minutes for the surgery part please     FAMILY HISTORY    Family History  Problem Relation Age of Onset   Breast cancer Mother 29   Cancer Mother        Breast    Cirrhosis Father    Breast cancer Paternal Aunt 29   Cancer Maternal Aunt        Breast      SOCIAL HISTORY   Social History   Tobacco Use   Smoking status: Former    Packs/day: 0.50    Years: 20.00    Total pack years: 10.00    Types: Cigarettes    Quit date: 07/02/2012    Years since quitting: 10.0   Smokeless tobacco: Current    Types: Snuff  Vaping Use   Vaping Use: Never used  Substance Use Topics   Alcohol use: Yes    Alcohol/week: 3.0 standard drinks of alcohol    Types: 3 Cans of beer per week    Comment: Occasionally beer   Drug use: No     MEDICATIONS    Home Medication:    Current Medication:  Current Facility-Administered Medications:    acetaminophen (TYLENOL) tablet 650 mg, 650 mg, Oral, Q6H PRN, 650 mg at 07/26/22 2311 **OR** acetaminophen (TYLENOL) suppository 650 mg, 650 mg, Rectal, Q6H PRN, Athena Masse, MD   amiodarone (PACERONE) tablet 200 mg, 200 mg, Oral, Daily, Judd Gaudier V, MD, 200 mg at 07/26/22 9562   apixaban (ELIQUIS) tablet 2.5 mg, 2.5 mg, Oral, BID, Judd Gaudier V, MD, 2.5 mg at 07/26/22 1951   atorvastatin (LIPITOR) tablet 40 mg, 40 mg, Oral, Daily, Judd Gaudier V, MD, 40 mg at 07/26/22 1308   calcium carbonate (TUMS - dosed in mg elemental calcium) chewable tablet 200 mg of elemental calcium, 1 tablet, Oral, TID PRN, Nicole Kindred A, DO, 200 mg of elemental calcium at 07/24/22 2203   citalopram (CELEXA) tablet 20 mg, 20 mg, Oral, Daily, Nicole Kindred A, DO, 20 mg at 07/26/22 0811   feeding supplement (BOOST / RESOURCE BREEZE) liquid 1 Container, 1 Container, Oral, TID BM, Nicole Kindred A, DO, 1 Container at 07/26/22 6578   guaiFENesin (MUCINEX) 12 hr tablet 1,200 mg, 1,200 mg, Oral, BID PRN, Wouk, Ailene Rud, MD   HYDROcodone-acetaminophen (NORCO/VICODIN) 5-325 MG per tablet 1-2 tablet, 1-2 tablet, Oral, Q4H  PRN, Athena Masse, MD, 2 tablet at 07/25/22 2159   melatonin tablet 5 mg, 5 mg, Oral, QHS PRN, Sharion Settler, NP, 5 mg at 07/26/22 2311   multivitamin with minerals tablet 1 tablet, 1 tablet, Oral, Daily, Nicole Kindred A, DO, 1 tablet at 07/26/22 0811   pantoprazole (PROTONIX) EC tablet 40 mg, 40 mg, Oral, BID AC, Judd Gaudier V, MD, 40 mg at 07/27/22 0749   polyethylene glycol (MIRALAX / GLYCOLAX) packet 17 g, 17 g, Oral, Daily, Breeze, Shantelle, NP, 17 g at 07/25/22 0909   simethicone (MYLICON) 40 IO/9.6EX suspension 40 mg, 40 mg, Oral, QID PRN, Oswald Hillock, RPH, 40 mg at 07/21/22 2249   sodium bicarbonate tablet 1,300 mg, 1,300 mg, Oral, Daily, Damita Dunnings,  Waldemar Dickens, MD, 1,300 mg at 07/26/22 0811   sodium chloride flush (NS) 0.9 % injection 3 mL, 3 mL, Intravenous, Q12H, Nicole Kindred A, DO, 3 mL at 07/26/22 2158   torsemide (DEMADEX) tablet 20 mg, 20 mg, Oral, Daily, Ottie Glazier, MD    ALLERGIES   Patient has no known allergies.     REVIEW OF SYSTEMS    Review of Systems:  Gen:  Denies  fever, sweats, chills weigh loss  HEENT: Denies blurred vision, double vision, ear pain, eye pain, hearing loss, nose bleeds, sore throat Cardiac:  No dizziness, chest pain or heaviness, chest tightness,edema Resp:   reports dyspnea chronically  Gi: Denies swallowing difficulty, stomach pain, nausea or vomiting, diarrhea, constipation, bowel incontinence Gu:  Denies bladder incontinence, burning urine Ext:   Denies Joint pain, stiffness or swelling Skin: Denies  skin rash, easy bruising or bleeding or hives Endoc:  Denies polyuria, polydipsia , polyphagia or weight change Psych:   Denies depression, insomnia or hallucinations   Other:  All other systems negative   VS: BP (!) 117/98 (BP Location: Right Arm)   Pulse 84   Temp 97.6 F (36.4 C)   Resp 20   Ht $R'5\' 6"'gN$  (1.676 m)   Wt 51.2 kg   SpO2 98%   BMI 18.22 kg/m      PHYSICAL EXAM    GENERAL:NAD, no fevers,  chills, no weakness no fatigue HEAD: Normocephalic, atraumatic.  EYES: Pupils equal, round, reactive to light. Extraocular muscles intact. No scleral icterus.  MOUTH: Moist mucosal membrane. Dentition intact. No abscess noted.  EAR, NOSE, THROAT: Clear without exudates. No external lesions.  NECK: Supple. No thyromegaly. No nodules. No JVD.  PULMONARY: decreased breath sounds with mild rhonchi worse at bases bilaterally.  CARDIOVASCULAR: S1 and S2. Regular rate and rhythm. No murmurs, rubs, or gallops. No edema. Pedal pulses 2+ bilaterally.  GASTROINTESTINAL: Soft, nontender, nondistended. No masses. Positive bowel sounds. No hepatosplenomegaly.  MUSCULOSKELETAL: No swelling, clubbing, or edema. Range of motion full in all extremities.  NEUROLOGIC: Cranial nerves II through XII are intact. No gross focal neurological deficits. Sensation intact. Reflexes intact.  SKIN: No ulceration, lesions, rashes, or cyanosis. Skin warm and dry. Turgor intact.  PSYCHIATRIC: Mood, affect within normal limits. The patient is awake, alert and oriented x 3. Insight, judgment intact.       IMAGING     ASSESSMENT/PLAN   Acute on chronic hypoxemic respiratory failure -patient with advanced COPD/CHF/CKD/lung cancer -she is with a least moderate severity physical deconditioning -all of these comorbidities are currently playing role in dyspnea -Advanced COPD - continue ICS/LABA/LAMA with Breo Ellipta and Incruse and addition of PRN albuterol nebulizer  -CHF/CKD overlap- adding aldactone 12.5 and reducing metoprolol from 25 to 12.5 bid to allow hemodynamic leveling with midodrine and Atrial fibrillation while diuresing 3. Atelectasis - worse at RLL - Metaneb with saline q8h 4. Right pleural effusion -  adding more diuresis, holding thoracentesis due to high likelyhood of rapid re-accumulation 5. Physical deconditioning- patient is currently with PT/OT and would benefit from same on outpatient 6. Atrial  fibrillation - chronic stable - continue eliquis, metoprolol, amiodarone          Thank you for allowing me to participate in the care of this patient.   Patient/Family are satisfied with care plan and all questions have been answered.    Provider disclosure: Patient with at least one acute or chronic illness or injury that poses a threat to life or  bodily function and is being managed actively during this encounter.  All of the below services have been performed independently by signing provider:  review of prior documentation from internal and or external health records.  Review of previous and current lab results.  Interview and comprehensive assessment during patient visit today. Review of current and previous chest radiographs/CT scans. Discussion of management and test interpretation with health care team and patient/family.   This document was prepared using Dragon voice recognition software and may include unintentional dictation errors.     Ottie Glazier, M.D.  Division of Pulmonary & Critical Care Medicine

## 2022-07-27 NOTE — Progress Notes (Signed)
[facility]  ROUNDING NOTE   Subjective:   Danielle [name] 66 year old female with past medical conditions including diabetes, hypertension, lymphedema, breast cancer COPD and chronic kidney disease.  Patient presents to the emergency department of shortness of breath and has been admitted for Pleural effusion [J90] Status post thoracentesis [Z98.890] CHF exacerbation (HCC) [I50.9] Pleural effusion on right [J90] Acute on chronic combined systolic and diastolic CHF (congestive heart failure) (HCC) [I50.43] Acute on chronic respiratory failure with hypoxia ([place]) [J96.21]  Patient is known to our practice and is followed outpatient by Dr. Holley Raring.  She was last seen in office on April 19, 2022.    Update: Patient is sitting up in bed Receiving breathing treatment Complained of SOB this morning  Creatinine 2.13   Objective:  Vital signs in last 24 hours:  Temp:  [97.6 F (36.4 C)-98 F (36.7 C)] 97.6 F (36.4 C) (10/13 0823) Pulse Rate:  [70-106] 84 (10/13 0823) Resp:  [15-22] 20 (10/13 0823) BP: (99-142)/(66-98) 117/98 (10/13 0823) SpO2:  [91 %-98 %] 98 % (10/13 0823) Weight:  [51.2 kg] 51.2 kg (10/13 0449)  Weight change:  Filed Weights   07/24/22 0539 07/25/22 0352 07/27/22 0449  Weight: 53.2 kg 51.1 kg 51.2 kg    Intake/Output: I/O last 3 completed shifts: In: 76 [P.O.:120; I.V.:3] Out: -    Intake/Output this shift:  Total I/O In: 120 [P.O.:120] Out: -   Physical Exam: General: Sitting side of bed  Head: Normocephalic, atraumatic. Moist oral mucosal membranes  Eyes: Anicteric  Lungs:  Fine crackles in bases  Heart: Irregular rate and rhythm  Abdomen:  Soft, tender, nondistended  Extremities: No peripheral edema.  Neurologic: Nonfocal, moving all four extremities  Skin: No lesions  Access: None    Basic Metabolic Panel: Recent Labs  Lab 07/21/22 0632 07/22/22 0528 07/23/22 0507 07/24/22 0435 07/25/22 0443 07/26/22 0409 07/27/22 0710   NA 136 140 139 141 140 141 143  K 3.7 4.1 4.5 4.1 4.2 4.3 4.1  CL 104 109 109 106 105 110 109  CO2 26 27 26 26 25 26 25   GLUCOSE 82 91 92 86 93 97 86  BUN 47* 48* 34* 38* 42* 46* 47*  CREATININE 2.53* 2.25* 1.80* 1.96* 2.27* 2.32* 2.13*  CALCIUM 8.6* 8.5* 8.6* 9.1 8.9 8.5* 9.1  MG 1.6* 2.1  --  1.7 2.2  --   --   PHOS  --   --   --   --   --  6.0*  --      Liver Function Tests: Recent Labs  Lab 07/26/22 0409  ALBUMIN 3.1*    No results for input(s): "LIPASE", "AMYLASE" in the last 168 hours. No results for input(s): "AMMONIA" in the last 168 hours.  CBC: Recent Labs  Lab 07/21/22 0632  WBC 3.7*  HGB 9.2*  HCT 28.3*  MCV 94.0  PLT 177     Cardiac Enzymes: No results for input(s): "CKTOTAL", "CKMB", "CKMBINDEX", "TROPONINI" in the last 168 hours.  BNP: Invalid input(s): "POCBNP"  CBG: No results for input(s): "GLUCAP" in the last 168 hours.  Microbiology: Results for orders placed or performed during the hospital encounter of 07/25/2022  Resp Panel by RT-PCR (Flu A&B, Covid) Anterior Nasal Swab     Status: None   Collection Time: 08/01/2022  6:43 PM   Specimen: Anterior Nasal Swab  Result Value Ref Range Status   SARS Coronavirus 2 by RT PCR NEGATIVE NEGATIVE Final    Comment: (NOTE)  SARS-CoV-2 target nucleic acids are NOT DETECTED.  The SARS-CoV-2 RNA is generally detectable in upper respiratory specimens during the acute phase of infection. The lowest concentration of SARS-CoV-2 viral copies this assay can detect is 138 copies/mL. A negative result does not preclude SARS-Cov-2 infection and should not be used as the sole basis for treatment or other patient management decisions. A negative result may occur with  improper specimen collection/handling, submission of specimen other than nasopharyngeal swab, presence of viral mutation(s) within the areas targeted by this assay, and inadequate number of viral copies(<138 copies/mL). A negative result must be  combined with clinical observations, patient history, and epidemiological information. The expected result is Negative.  Fact Sheet for Patients:  EntrepreneurPulse.com.au  Fact Sheet for Healthcare Providers:  IncredibleEmployment.be  This test is no t yet approved or cleared by the [place] FDA and  has been authorized for detection and/or diagnosis of SARS-CoV-2 by FDA under an Emergency Use Authorization (EUA). This EUA will remain  in effect (meaning this test can be used) for the duration of the COVID-19 declaration under Section 564(b)(1) of the Act, 21 U.S.C.section 360bbb-3(b)(1), unless the authorization is terminated  or revoked sooner.       Influenza A by PCR NEGATIVE NEGATIVE Final   Influenza B by PCR NEGATIVE NEGATIVE Final    Comment: (NOTE) The Xpert Xpress SARS-CoV-2/FLU/RSV plus assay is intended as an aid in the diagnosis of influenza from Nasopharyngeal swab specimens and should not be used as a sole basis for treatment. Nasal washings and aspirates are unacceptable for Xpert Xpress SARS-CoV-2/FLU/RSV testing.  Fact Sheet for Patients: EntrepreneurPulse.com.au  Fact Sheet for Healthcare Providers: IncredibleEmployment.be  This test is not yet approved or cleared by the [place] FDA and has been authorized for detection and/or diagnosis of SARS-CoV-2 by FDA under an Emergency Use Authorization (EUA). This EUA will remain in effect (meaning this test can be used) for the duration of the COVID-19 declaration under Section 564(b)(1) of the Act, 21 U.S.C. section 360bbb-3(b)(1), unless the authorization is terminated or revoked.  Performed at [facility], [address] [place], [place] [address]     Coagulation Studies: No results for input(s): "LABPROT", "INR" in the last 72 hours.  Urinalysis: No results for input(s): "COLORURINE", "LABSPEC", "PHURINE",  "GLUCOSEU", "HGBUR", "BILIRUBINUR", "KETONESUR", "PROTEINUR", "UROBILINOGEN", "NITRITE", "LEUKOCYTESUR" in the last 72 hours.  Invalid input(s): "APPERANCEUR"    Imaging: DG Chest Port 1 View  Result Date: 07/26/2022 CLINICAL DATA:  Pleural effusion, shortness of breath EXAM: PORTABLE CHEST 1 VIEW COMPARISON:  07/23/2022 FINDINGS: Right pleural effusion is similar to prior study. Right lower lobe airspace opacities also stable. No confluent opacity on the left. Heart is normal size. Aortic atherosclerosis. No acute bony abnormality. IMPRESSION: Stable small right pleural effusion with right lower lobe airspace opacities. Electronically Signed   By: Rolm Baptise M.D.   On: 07/26/2022 14:26     Medications:      amiodarone  200 mg Oral Daily   apixaban  2.5 mg Oral BID   atorvastatin  40 mg Oral Daily   citalopram  20 mg Oral Daily   feeding supplement  1 Container Oral TID BM   multivitamin with minerals  1 tablet Oral Daily   pantoprazole  40 mg Oral BID AC   polyethylene glycol  17 g Oral Daily   sodium bicarbonate  1,300 mg Oral Daily   sodium chloride flush  3 mL Intravenous Q12H   torsemide  20  mg Oral Daily   acetaminophen **OR** acetaminophen, calcium carbonate, guaiFENesin, HYDROcodone-acetaminophen, melatonin, simethicone  Assessment/ Plan:  Ms. ELOIS AVERITT is a 66 y.o.  female with past medical conditions including diabetes, hypertension, lymphedema, breast cancer COPD and chronic kidney disease.  Patient presents to the emergency department of shortness of breath and has been admitted for Pleural effusion [J90] Status post thoracentesis [Z98.890] CHF exacerbation ([place]) [I50.9] Pleural effusion on right [J90] Acute on chronic combined systolic and diastolic CHF (congestive heart failure) (HCC) [I50.43] Acute on chronic respiratory failure with hypoxia (HCC) [J96.21]   Acute Kidney Injury on chronic kidney disease stage IIIb with baseline creatinine 1.50 and GFR of  38 on 06/16/22.  Acute kidney injury secondary to cardiorenal syndrome and diuretic therapy Chronic kidney disease is secondary to hypertension and CHF.  No IV contrast exposure.    Creatinine slightly improved.  Urine occurrences recorded.  Will order furosemide 40 mg IV once to manage shortness of breath.  Lab Results  Component Value Date   CREATININE 2.13 (H) 07/27/2022   CREATININE 2.32 (H) 07/26/2022   CREATININE 2.27 (H) 07/25/2022    Intake/Output Summary (Last 24 hours) at 07/27/2022 1059 Last data filed at 07/27/2022 0800 Gross per 24 hour  Intake 120 ml  Output --  Net 120 ml     2. Anemia of chronic kidney disease Lab Results  Component Value Date   HGB 9.2 (L) 07/21/2022    Hemoglobin at goal  3.  Hypertension with chronic kidney disease.  Home regimen includes torsemide, amiodarone, hydralazine, and isosorbide. Continue Midodrine 5mg  three times daily for BP support.  Hydralazine and diuretics held.  Will give one-time dose furosemide IV.  4.  Pleural effusions, recurrent.  Seen on chest x-ray on admission.  Radiology performed right thoracentesis on admission with 900 mL removed.   LOS: 9   10/13/202310:59 AM

## 2022-07-27 NOTE — TOC Progression Note (Signed)
Transition of Care [facility]) - Progression Note    Patient Details  Name: Danielle [name] MRN: [id number] Date of Birth: [birth year]  Transition of Care [facility]) CM/SW [place], [place] Phone Number: 07/27/2022, 9:18 AM  Clinical Narrative:     [facility] unable to accept, referral sent to [facility] pending acceptance at this time for home health services.   Expected Discharge Plan: Home/Self Care Barriers to Discharge: Continued Medical Work up  Expected Discharge Plan and Services Expected Discharge Plan: Home/Self Care   Discharge Planning Services: CM Consult   Living arrangements for the past 2 months: Single Family Home                                       Social Determinants of Health (SDOH) Interventions    Readmission Risk Interventions    05/09/2022    3:11 PM 01/29/2022   11:36 AM 11/29/2021   10:49 AM  Readmission Risk Prevention Plan  Transportation Screening Complete Complete Complete  Medication Review Press photographer) Complete Complete Complete  PCP or Specialist appointment within 3-5 days of discharge  Complete Complete  HRI or Home Care Consult Patient refused  Complete  SW Recovery Care/Counseling Consult Complete Complete Complete  Palliative Care Screening Not Applicable Not Applicable Not [place] Not Applicable Not Applicable Not Applicable

## 2022-07-27 NOTE — Progress Notes (Addendum)
Progress Note   Patient: Danielle [name] GYI:[id number] DOB: 1956-02-11 DOA: 07/26/2022     9 DOS: the patient was seen and examined on 07/27/2022   Brief hospital course: Danielle [name] is a 66 y.o. 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     ED course and evaluation --- BP 135/99 with RR 29 with O2 sat 90% on increased O2 flow rate of 5 L/min.  Labs with troponin 15>>17, BNP 2935.  Treated with IV Lasix in the ED.   Chest x-ray showed -- Small right pleural effusion, increased since prior study.  Increasing right basilar consolidation, which may reflect airspace disease or progressive atelectasis.  Mass-like linear consolidation right suprahilar region, unchanged since recent CT.   Patient was admitted to hospitalist service and IV diuresis was continued.     IR was consulted for thoracentesis which was done on 10/5 with 900 cc fluid removed.   Hospital course complicated by: --Acute kidney injury after diuresis due to cardiorenal physiology.  Nephrology was consulted.   Renal function improved with diuretics held, but pt with persistent and worsening exertional dyspnea again. --Abdominal pain for which evaluation including abdominal xray and CT abdomen/pelvis was unremarkable. --Swallowing difficulties with severe esophageal dysmotility discovered on barium esophagram.     Assessment and Plan: *  Acute on chronic combined systolic and diastolic CHF (congestive heart failure) ([place]) Echo 01/30/22 -- EF 35-40% with regional WMA's, grade II diastolic dysfunction, moderate MR, mod-severe TR, moderate AS. Presented with recurrent pleural effusion and progressively worsening dyspnea. S/p thoracentesis -- initially treated with lasix --torsemide and spiro resumed by nephro but worsening renal function 10/11, diuresis held since then and gentle hydration --Pulmonology following, Recommended against repeat thoracentesis given likelihood for rapid re-accumulation --unfortunately although kidney function has not returned to baseline, now with worsening pulm edema, dyspnea, hypoxia. Appreciate pulm input, has discontinued metop and midodrine today, re-started diuresis  End-of-life care Discussed with patient today she is suffering from progressive failure of multiple vital organs (lungs, heart, kidneys). Discussed code status, concept of comfort care, etc.  - have asked palliative to re-engage with family  Esophageal dysmotility Pt reports difficulty with food and especially pills getting stuck in her esophagus and come back up at times, take a long time to pass into her stomach, causes a lot of discomfort.  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 --Reviewed prior EGD results from May this year, no mention of esophageal findings. --Esophagram 10/7 showed severe esophageal dysmotility, no evidence of obstruction or stenosis --calorie count is normal, no need for PEG at the moment --Dietitian and SLP following  Prolonged QT interval QTc was 592 on EKG on admission.   Improved - repeat EKG 10/6 QTc 450 ms. --Serial EKG's to monitor --Keep K>4, Mg>2 --Telemetry monitoring  Malignant neoplasm of upper lobe of right lung Malignant neoplasm of lower third of esophagus Patient is status post radiation therapy for esophageal cancer S/p radiation therapy for lung cancer Follow-up with  oncology  Recurrent right pleural effusion Presented with shortness of breath, dyspnea on exertion with chest discomfort, desaturating to the low 80s on her baseline flow rate.  Chest xray with increasing right pleural effusion from prior, BNP over 2000.   Chart reviewed: prior thoracenteses done this year on 1/30, 2/8, 4/16, 5/12, 7/25, 8/8, 9/5 --IR was consulted on admission. --Underwent thoracentesis on 10/5 with 900 cc fluid removed. --Pulmonology consulted, recommends against repeat thora. Added low dose aldactone for more diuresis and CHF benefit, now on hold as abive -worsening O2 today - resuming diuresis as above  Paroxysmal atrial fibrillation (HCC) Metop held by pulm today but hr now persistently in the 130s so will re-start --Telemetry --Continue amiodarone and Elqius  HLD (hyperlipidemia) On Lipitor  Abdominal pain Resolved, ct nothing acute  Anemia of chronic disease Hemoglobin at baseline  Hypotension History of same intermittently and limiting GDMT, and in setting of diuresis.   No fever or leukocytosis   Chronic obstructive pulmonary disease (HCC) Severe COPD with chronic respiratory failure on 4 L of continuous O2 at home. --Continue scheduled and PRN bronchodilators  --Continue inhaled steroids --Pulmonology following  Aki on ckd 3b Developed AKI likely due to diuresis, cardiorenal physiology. Worsened today, holding diuretics Nephrology following. Gentle fluids today  Depression Continue Celexa 20 mg daily  Acute on chronic respiratory failure with hypoxia (HCC) Baseline on 4 L/min continuous O2. Today requiring more  Carcinoma of overlapping sites of left breast in female, estrogen receptor positive ([place]) Noted. No acute issues.        Subjective: says breathing a little worse today   Physical Exam: Vitals:   07/27/22 0448 07/27/22 0449 07/27/22 0640 07/27/22 0823  BP: 124/82  118/68 (!) 117/98  Pulse: 76  75 84  Resp: 18  16 20    Temp: 97.8 F (36.6 C)  97.9 F (36.6 C) 97.6 F (36.4 C)  TempSrc: Oral  Oral   SpO2: 97%  98% 98%  Weight:  51.2 kg    Height:       General exam: awake, alert, no acute distress HEENT: moist mucus membranes, hearing grossly normal  Respiratory system: rales at bases, normal wob, mild tachypnea Cardiovascular system: normal T9/H7,  RRR, systolic murmur noted, no peripheral edema Gastrointestinal system: abdomen is soft, non-tender, no guarding or rebound Central nervous system: A&O x3. no gross focal neurologic deficits, normal speech Extremities: moves all, no edema, normal tone Psychiatry: normal mood, congruent affect, judgement and insight appear normal    Family Communication:  Daughter updated telephonically 10/13  Consults: Palliative Care Pulmonology Nephrology   Disposition: Status is: Inpatient Remains inpatient appropriate because: worsening kidney function and respiratory status   Planned Discharge Destination: Home with home health    Author: Desma Maxim, MD 07/27/2022 10:14 AM  For on call review [email or web address].

## 2022-07-27 NOTE — Progress Notes (Signed)
Daily Progress Note   Patient Name: Danielle [name]       Date: 07/27/2022 DOB: [birth year]  Age: 66 y.o. MRN#: [id number] Attending Physician: Gwynne Edinger, MD Primary Care Physician: [name], [name] Admit Date: 08/11/2022  Reason for Consultation/Follow-up: Establishing goals of care  Subjective: Message sent by attending regarding request for further conversation as patient's status is continuing to decline. Notes and labs reviewed.  In to speak with patient.  Her grandchildren and niece were at bedside and her daughter was on the phone.   We discussed her diagnoses, prognosis, GOC, EOL wishes disposition and options.  Created space and opportunity for patient  to explore thoughts and feelings regarding current medical information.   A detailed discussion was had today regarding advanced directives.  Concepts specific to code status, artifical feeding and hydration, IV antibiotics and rehospitalization were discussed.  The difference between an aggressive medical intervention path and a comfort care path was discussed.  Values and goals of care important to patient and family were attempted to be elicited.  Discussed limitations of medical interventions to prolong quality of life in some situations and discussed the concept of human mortality.  We discussed how the different organs, disease processes, and medications affect each other.  We recapped her wishes as of our last conversation.  She states that she wants to do what she can to try to live as long as she can.  She states she would not want to live long-term on a ventilator.  When we discussed the length of time patient would want to remain on a ventilator, patient states that she really just wants to do what ever her children  and family want her to do.  When we discussed care immediately following this admission she states that she would want to do what her children and family wanted to do in that circumstance as well.  She states that if it is not what her family wants, she does not want to suffer by watching them suffer in regards to providing for her her care needs.  Family states that they want her to make decisions regarding her care, and that they will follow what ever her wishes are.  Daughter states that she will talk to patient personally to determine care moving forward.  They are aware patient  is a hospice candidate if she chooses.  Length of Stay: 9  Current Medications: Scheduled Meds:   amiodarone  200 mg Oral Daily   apixaban  2.5 mg Oral BID   atorvastatin  40 mg Oral Daily   citalopram  20 mg Oral Daily   feeding supplement  1 Container Oral TID BM   metoprolol tartrate  25 mg Oral BID   multivitamin with minerals  1 tablet Oral Daily   pantoprazole  40 mg Oral BID AC   polyethylene glycol  17 g Oral Daily   sodium bicarbonate  1,300 mg Oral Daily   sodium chloride flush  3 mL Intravenous Q12H   torsemide  20 mg Oral Daily    Continuous Infusions:   PRN Meds: acetaminophen **OR** acetaminophen, calcium carbonate, guaiFENesin, HYDROcodone-acetaminophen, melatonin, simethicone  Physical Exam Pulmonary:     Effort: Pulmonary effort is normal.  Neurological:     Mental Status: She is alert.             Vital Signs: BP (!) 141/104 (BP Location: Right Arm)   Pulse (!) 134   Temp 97.6 F (36.4 C)   Resp 20   Ht 5\' 6"  (1.676 m)   Wt 51.2 kg   SpO2 99%   BMI 18.22 kg/m  SpO2: SpO2: 99 % O2 Device: O2 Device: Nasal Cannula O2 Flow Rate: O2 Flow Rate (L/min): 12 L/min  Intake/output summary:  Intake/Output Summary (Last 24 hours) at 07/27/2022 1523 Last data filed at 07/27/2022 1304 Gross per 24 hour  Intake 120 ml  Output 900 ml  Net -780 ml   LBM: Last BM Date :  07/27/22 Baseline Weight: Weight: 50.3 kg Most recent weight: Weight: 51.2 kg         Patient Active Problem List   Diagnosis Date Noted   Esophageal dysmotility 07/21/2022   Prolonged QT interval 07/19/2022   Streptococcal pneumonia ([place]) 06/20/2022   Dyspnea 05/23/2022   SOB (shortness of breath) 05/22/2022   Chronic respiratory failure with hypoxia (HCC)    Recurrent right pleural effusion 05/08/2022   Paroxysmal atrial fibrillation ([place]) 03/10/2022   Dyslipidemia 03/10/2022   Essential hypertension 03/10/2022   COPD, severe ([place]) 03/10/2022   Atrial fibrillation with RVR ([place]) 02/22/2022   TIA (transient ischemic attack) 01/29/2022   Epistaxis 01/27/2022   C. difficile colitis 11/27/2021   Abdominal pain 11/26/2021   HLD (hyperlipidemia) 11/26/2021   Iron deficiency anemia 11/26/2021   Aspiration pneumonia of right lower lobe due to gastric secretions (HCC)    Ileus following gastrointestinal surgery (HCC)    Hypomagnesemia    Anemia of chronic disease    Hyperphosphatemia 10/24/2021   Hypotension 11/17/2020   Ileostomy status ([place]) 11/17/2020   Chronic obstructive pulmonary disease ([place]) 08/01/2020   CKD (chronic kidney disease), stage IV ([place]) 01/20/2020   Hypokalemia 01/20/2020   Elevated troponin 01/20/2020   Sepsis ([place]) 01/20/2020   Acute on chronic combined systolic and diastolic CHF (congestive heart failure) (HCC) 01/20/2020   Pleural effusion due to CHF (congestive heart failure) (HCC) 11/06/2019   Diarrhea    GERD (gastroesophageal reflux disease) 10/29/2019   Depression 10/29/2019   Acute blood loss anemia 06/20/2019   Normocytic anemia 06/20/2019   Anemia of chronic kidney failure, stage 4 (severe) ([place]) 06/12/2019   Malignant neoplasm of lower third of esophagus ([place]) 05/01/2019   Acute on chronic respiratory failure with hypoxia and hypercapnia (HCC) 12/22/2018   Ileostomy dysfunction ([place]) 09/28/2018   Reflux esophagitis  Iron deficiency anemia  secondary to blood loss (chronic)    SBO (small bowel obstruction) ([place]) 06/29/2018   Atrial flutter ([place]) 06/09/2018   Lymphedema 06/09/2018   Acute kidney injury superimposed on CKD ([place]) 06/04/2018   Severe protein-calorie malnutrition ([place]) 06/04/2018   Focal (segmental) acute (reversible) ischemia of large intestine (HCC)    Ulceration of intestine    Diverticulosis of large intestine without diverticulitis    Colitis    Malnutrition of moderate degree 04/23/2018   Palliative care by specialist    DNR (do not resuscitate) discussion    Weakness    Acute on chronic respiratory failure with hypoxia ([place]) 04/21/2018   Hyponatremia 02/10/2017   Malignant neoplasm of upper lobe of right lung 07/30/2016   Lung nodule, solitary 07/25/2016   Malignant neoplasm of upper lobe of right lung ([place]) 07/25/2016   Carcinoma of overlapping sites of left breast in female, estrogen receptor positive ([place]) 06/22/2016   Multiple lung nodules 06/22/2016   Lesion of right lung    Breast cancer in female ([place]) 02/21/2016   Moderate COPD (chronic obstructive pulmonary disease) ([place]) 08/24/2014    Palliative Care Assessment & Plan   Recommendations/Plan: Patient wants her family to make decisions on care moving forward.  Family wants patient to make decisions on care moving forward.  Patient and family are aware that patient is a candidate for hospice at home if she chooses.      Code Status:    Code Status Orders  (From admission, onward)           Start     Ordered   07/17/2022 1945  Full code  Continuous        08/12/2022 1946           Code Status History     Date Active Date Inactive Code Status Order ID Comments User Context   06/17/2022 1013 06/21/2022 1951 Full Code [id number]  Clance Boll, MD ED   05/22/2022 1917 05/26/2022 2026 Full Code [id number]  Para Skeans, MD ED   05/08/2022 1108 05/12/2022 1654 Full Code [id number]  Collier Bullock, MD ED   03/10/2022 0151 03/11/2022  2340 Full Code [id number]  Mansy, Arvella Merles, MD ED   02/21/2022 2006 02/24/2022 1903 Full Code [id number]  Clance Boll, MD ED   01/26/2022 2006 02/04/2022 1500 Full Code [id number]  Clarnce Flock, MD Inpatient   11/26/2021 1623 11/29/2021 2154 Full Code [id number]  Ivor Costa, MD ED   10/23/2021 1800 11/24/2021 1619 Full Code [id number]  Herbert Pun, MD Inpatient   11/17/2020 0432 11/18/2020 1748 Full Code [id number]  Athena Masse, MD ED   11/05/2020 0604 11/10/2020 1631 Full Code [id number]  Benjamine Sprague, [place] ED   08/01/2020 1124 08/05/2020 2228 Full Code [id number]  Collier Bullock, MD ED   01/20/2020 0807 01/21/2020 2051 Full Code [id number]  Ivor Costa, MD ED   11/06/2019 1648 11/11/2019 1749 Full Code [id number]  Max Sane, MD ED   10/29/2019 1436 11/04/2019 1859 Full Code [id number]  Ivor Costa, MD ED   10/14/2019 0253 10/21/2019 1810 Full Code [id number]  Athena Masse, MD ED   09/27/2019 0211 10/02/2019 1736 Full Code [id number]  Athena Masse, MD ED   06/19/2019 1124 06/20/2019 1742 Full Code [id number]  Lang Snow, NP ED   12/22/2018 0844 12/24/2018 1727 Full Code [id number]  Harrie Foreman Inpatient   09/28/2018 0813 10/04/2018 1522 Full Code [id number]  Herbert Pun, MD Inpatient  09/05/2018 0616 09/10/2018 1708 Full Code [id number]  Arta Silence, MD ED   08/12/2018 0540 08/16/2018 2057 Full Code [id number]  Arta Silence, MD ED   07/21/2018 0332 07/23/2018 1718 Full Code [id number]  Derrek [place], RN Inpatient   07/18/2018 0640 07/21/2018 0330 DNR [id number]  Harrie Foreman Inpatient   07/05/2018 0808 07/09/2018 1946 DNR [id number]  Harrie Foreman Inpatient   06/30/2018 1733 07/02/2018 1507 DNR [id number]  [name], MD Inpatient   06/30/2018 0019 06/30/2018 1733 Full Code [id number]  Amelia Jo, MD Inpatient   06/04/2018 0113 06/05/2018 1448 Full Code [id number]  Amelia Jo, MD Inpatient   04/21/2018 0633 05/17/2018 2214 Full Code [id number]  Arta Silence, MD Inpatient   02/09/2017 1125 02/11/2017 1706 Full Code [id number]  Henreitta Leber, MD Inpatient       Care plan was discussed with attending  Thank you for allowing the Palliative Medicine Team to assist in the care of this patient.   Asencion Gowda, NP  Please contact Palliative Medicine Team phone at [phone] for questions and concerns.

## 2022-07-28 ENCOUNTER — Inpatient Hospital Stay: Payer: Medicare Other

## 2022-07-28 DIAGNOSIS — I5043 Acute on chronic combined systolic (congestive) and diastolic (congestive) heart failure: Secondary | ICD-10-CM | POA: Diagnosis not present

## 2022-07-28 LAB — CBC WITH DIFFERENTIAL/PLATELET
Abs Immature Granulocytes: 0.01 10*3/uL (ref 0.00–0.07)
Basophils Absolute: 0.1 10*3/uL (ref 0.0–0.1)
Basophils Relative: 1 %
Eosinophils Absolute: 0.1 10*3/uL (ref 0.0–0.5)
Eosinophils Relative: 2 %
HCT: 32.8 % — ABNORMAL LOW (ref 36.0–46.0)
Hemoglobin: 10.4 g/dL — ABNORMAL LOW (ref 12.0–15.0)
Immature Granulocytes: 0 %
Lymphocytes Relative: 18 %
Lymphs Abs: 1.2 10*3/uL (ref 0.7–4.0)
MCH: 31 pg (ref 26.0–34.0)
MCHC: 31.7 g/dL (ref 30.0–36.0)
MCV: 97.9 fL (ref 80.0–100.0)
Monocytes Absolute: 0.4 10*3/uL (ref 0.1–1.0)
Monocytes Relative: 7 %
Neutro Abs: 4.7 10*3/uL (ref 1.7–7.7)
Neutrophils Relative %: 72 %
Platelets: 230 10*3/uL (ref 150–400)
RBC: 3.35 MIL/uL — ABNORMAL LOW (ref 3.87–5.11)
RDW: 16.1 % — ABNORMAL HIGH (ref 11.5–15.5)
WBC: 6.6 10*3/uL (ref 4.0–10.5)
nRBC: 0.5 % — ABNORMAL HIGH (ref 0.0–0.2)

## 2022-07-28 LAB — BASIC METABOLIC PANEL
Anion gap: 10 (ref 5–15)
Anion gap: 11 (ref 5–15)
BUN: 46 mg/dL — ABNORMAL HIGH (ref 8–23)
BUN: 48 mg/dL — ABNORMAL HIGH (ref 8–23)
CO2: 27 mmol/L (ref 22–32)
CO2: 27 mmol/L (ref 22–32)
Calcium: 9.1 mg/dL (ref 8.9–10.3)
Calcium: 9 mg/dL (ref 8.9–10.3)
Chloride: 104 mmol/L (ref 98–111)
Chloride: 102 mmol/L (ref 98–111)
Creatinine, Ser: 1.98 mg/dL — ABNORMAL HIGH (ref 0.44–1.00)
Creatinine, Ser: 2.34 mg/dL — ABNORMAL HIGH (ref 0.44–1.00)
GFR, Estimated: 27 mL/min — ABNORMAL LOW (ref >60)
GFR, Estimated: 22 mL/min — ABNORMAL LOW (ref >60)
Glucose, Bld: 72 mg/dL (ref 70–99)
Glucose, Bld: 168 mg/dL — ABNORMAL HIGH (ref 70–99)
Potassium: 4.3 mmol/L (ref 3.5–5.1)
Potassium: 4.1 mmol/L (ref 3.5–5.1)
Sodium: 141 mmol/L (ref 135–145)
Sodium: 140 mmol/L (ref 135–145)

## 2022-07-28 MED ORDER — SPIRONOLACTONE 12.5 MG HALF TABLET
12.5000 mg | ORAL_TABLET | Freq: Every day | ORAL | Status: DC
  Administered 2022-07-28 – 2022-08-08 (×12): 12.5 mg via ORAL
  Filled 2022-07-28 (×12): qty 1

## 2022-07-28 NOTE — Progress Notes (Addendum)
Progress Note   Patient: Danielle [name] WEX:[id number] DOB: 1956/02/23 DOA: 07/24/2022     10 DOS: the patient was seen and examined on 07/28/2022   Brief hospital course: Danielle [name] is a 66 y.o. female with medical history significant for Paroxysmal A-fib on chronic anticoagulation therapy, history of chronic systolic heart failure, history of adenocarcinoma of the right lung status post radiation therapy, history of stage IIIb chronic kidney disease, aortic valve stenosis, COPD with chronic respiratory failure on 4 L of oxygen continuous, history of left breast cancer status post mastectomy, history of esophageal cancer recurrent right-sided pleural effusion requiring thoracentesis, most recently on 06/19/2022 who presented to the ER on 08/02/2022 for evaluation of worsening shortness of breath with associated chest tightness.     ED course and evaluation --- BP 135/99 with RR 29 with O2 sat 90% on increased O2 flow rate of 5 L/min.  Labs with troponin 15>>17, BNP 2935.  Treated with IV Lasix in the ED.   Chest x-ray showed -- Small right pleural effusion, increased since prior study.  Increasing right basilar consolidation, which may reflect airspace disease or progressive atelectasis.  Mass-like linear consolidation right suprahilar region, unchanged since recent CT.   Patient was admitted to hospitalist service and IV diuresis was continued.     IR was consulted for thoracentesis which was done on 10/5 with 900 cc fluid removed.   Hospital course complicated by: --Acute kidney injury after diuresis due to cardiorenal physiology.  Nephrology was consulted.   Renal function improved with diuretics held, but pt with persistent and worsening exertional dyspnea again. --Abdominal pain for which evaluation including abdominal xray and CT abdomen/pelvis was unremarkable. --Swallowing difficulties with severe esophageal dysmotility discovered on barium esophagram.     Assessment and Plan: *  Acute on chronic combined systolic and diastolic CHF (congestive heart failure) ([place]) Echo 01/30/22 -- EF 35-40% with regional WMA's, grade II diastolic dysfunction, moderate MR, mod-severe TR, moderate AS. Presented with recurrent pleural effusion and progressively worsening dyspnea. S/p thoracentesis -- initially treated with lasix --torsemide and spiro resumed by nephro but worsening renal function 10/11, diuresis held since then and gentle hydration --Pulmonology following, Recommended against repeat thoracentesis given likelihood for rapid re-accumulation --diuresis re-started 10/13, repeat dose pending cxr today  End-of-life care Discussed with patient she is suffering from progressive failure of multiple vital organs (lungs, heart, kidneys). Discussed code status, concept of comfort care, etc. Palliative has re-engaged with the family, no final decisions made yet, remains full code   Esophageal dysmotility Pt reports difficulty with food and especially pills getting stuck in her esophagus and come back up at times, take a long time to pass into her stomach, causes a lot of discomfort.  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 --Reviewed prior EGD results from May this year, no mention of esophageal findings. --Esophagram 10/7 showed severe esophageal dysmotility, no evidence of obstruction or stenosis --calorie count is normal, no need for PEG at the moment --Dietitian and SLP following  Prolonged QT interval QTc was 592 on EKG on admission.   Improved - repeat EKG 10/6 QTc 450 ms. --Serial EKG's to monitor --Keep K>4, Mg>2 --Telemetry monitoring  Malignant neoplasm of upper lobe of right lung Malignant neoplasm of lower third of esophagus Patient is status post radiation therapy for esophageal cancer S/p radiation therapy for lung cancer Follow-up with oncology  Recurrent right pleural effusion Presented with shortness of breath, dyspnea on exertion with chest  discomfort, desaturating to the low 80s on her baseline flow rate.  Chest xray with increasing right pleural effusion from prior, BNP over 2000.   Chart reviewed: prior thoracenteses done this year on 1/30, 2/8, 4/16, 5/12, 7/25, 8/8, 9/5 --IR was consulted on admission. --Underwent thoracentesis on 10/5 with 900 cc fluid removed. --Pulmonology consulted, recommends against repeat thora.  - diuresis as above - continues to require HF [place]  Paroxysmal atrial fibrillation (HCC) Metop held by pulm today but needed to resume yesterday due to rvr, today hr controlled - cont metop  HLD (hyperlipidemia) On Lipitor  Abdominal pain Resolved, ct nothing acute  Anemia of chronic disease Hemoglobin at baseline  Hypotension History of same intermittently and limiting GDMT, and in setting of diuresis.   No fever or leukocytosis  Chronic obstructive pulmonary disease (HCC) Severe COPD with chronic respiratory failure on 4 L of continuous O2 at home. --Continue scheduled and PRN bronchodilators  --Continue inhaled steroids --Pulmonology following  Aki on ckd 3b Developed AKI likely due to diuresis, cardiorenal physiology. Gfr stable here in upper 46s Nephrology following.  Depression Continue Celexa 20 mg daily  Acute on chronic respiratory failure with hypoxia (HCC) Baseline on 4 L/min continuous O2. Today requiring more, persistent need for HF [place]  Carcinoma of overlapping sites of left breast in female, estrogen receptor positive ([place]) Noted. No acute issues.        Subjective: says breathing about the same today, some cough not productive, no fevers, had bm   Physical Exam: Vitals:   07/28/22 0415 07/28/22 0419 07/28/22 0822 07/28/22 1150  BP: 117/83  123/80 110/82  Pulse: 91  86 77  Resp: 16  18 19   Temp: 97.8 F (36.6 C)  97.8 F (36.6 C) 98 F (36.7 C)  TempSrc: Oral     SpO2: 100%  94% 100%  Weight:  47.9 kg    Height:       General exam: awake, alert, no  acute distress HEENT: moist mucus membranes, hearing grossly normal  Respiratory system: rales at bases, normal wob, mild tachypnea Cardiovascular system: normal M0/Q6,  RRR, systolic murmur noted, no peripheral edema Gastrointestinal system: abdomen is soft, non-tender, no guarding or rebound Central nervous system: A&O x3. no gross focal neurologic deficits, normal speech Extremities: moves all, no edema, normal tone Psychiatry: normal mood, congruent affect, judgement and insight appear normal    Family Communication:  none at bedside. Daughter updated telephonically 10/13  Consults: Palliative Care Pulmonology Nephrology   Disposition: Status is: Inpatient Remains inpatient appropriate because: worsening kidney function and respiratory status   Planned Discharge Destination: Home with home health    Author: Desma Maxim, MD 07/28/2022 12:14 PM  For on call review [email or web address].

## 2022-07-28 NOTE — Progress Notes (Signed)
[facility]  ROUNDING NOTE   Subjective:   Danielle [name] 66 year old female with past medical conditions including diabetes, hypertension, lymphedema, breast cancer COPD and chronic kidney disease.  Patient presents to the emergency department of shortness of breath and has been admitted for Pleural effusion [J90] Status post thoracentesis [Z98.890] CHF exacerbation (HCC) [I50.9] Pleural effusion on right [J90] Acute on chronic combined systolic and diastolic CHF (congestive heart failure) (HCC) [I50.43] Acute on chronic respiratory failure with hypoxia ([place]) [J96.21]  Patient is known to our practice and is followed outpatient by Dr. Holley Raring.  She was last seen in office on April 19, 2022.   Patient was seen today on second floor Patient offers no new specific physical complaints. Patient main question in today visit was if she could get heart and kidney transplant.  I educated patient about the transplant process.  I answered patient questions to the best of my ability  Objective:  Vital signs in last 24 hours:  Temp:  [97.7 F (36.5 C)-98 F (36.7 C)] 98 F (36.7 C) (10/14 1150) Pulse Rate:  [77-98] 77 (10/14 1150) Resp:  [16-20] 19 (10/14 1150) BP: (104-132)/(79-94) 110/82 (10/14 1150) SpO2:  [92 %-100 %] 100 % (10/14 1150) Weight:  [47.9 kg] 47.9 kg (10/14 0419)  Weight change: -3.3 kg Filed Weights   07/25/22 0352 07/27/22 0449 07/28/22 0419  Weight: 51.1 kg 51.2 kg 47.9 kg    Intake/Output: I/O last 3 completed shifts: In: 477 [P.O.:477] Out: 2300 [Urine:2300]   Intake/Output this shift:  No intake/output data recorded.  Physical Exam: General: Sitting side of bed  Head: Normocephalic, atraumatic. Moist oral mucosal membranes  Eyes: Anicteric  Lungs:  Fine crackles in bases  Heart: Irregular rate and rhythm  Abdomen:  Soft, tender, nondistended  Extremities: No peripheral edema.  Neurologic: Nonfocal, moving all four extremities  Skin: No  lesions  Access: None    Basic Metabolic Panel: Recent Labs  Lab 07/22/22 0528 07/23/22 0507 07/24/22 0435 07/25/22 0443 07/26/22 0409 07/27/22 0710 07/28/22 0425 07/28/22 1410  NA 140   < > 141 140 141 143 141 140  K 4.1   < > 4.1 4.2 4.3 4.1 4.3 4.1  CL 109   < > 106 105 110 109 104 102  CO2 27   < > 26 25 26 25 27 27   GLUCOSE 91   < > 86 93 97 86 72 168*  BUN 48*   < > 38* 42* 46* 47* 46* 48*  CREATININE 2.25*   < > 1.96* 2.27* 2.32* 2.13* 1.98* 2.34*  CALCIUM 8.5*   < > 9.1 8.9 8.5* 9.1 9.1 9.0  MG 2.1  --  1.7 2.2  --   --   --   --   PHOS  --   --   --   --  6.0*  --   --   --    < > = values in this interval not displayed.    Liver Function Tests: Recent Labs  Lab 07/26/22 0409  ALBUMIN 3.1*   No results for input(s): "LIPASE", "AMYLASE" in the last 168 hours. No results for input(s): "AMMONIA" in the last 168 hours.  CBC: Recent Labs  Lab 07/28/22 1410  WBC 6.6  NEUTROABS 4.7  HGB 10.4*  HCT 32.8*  MCV 97.9  PLT 230    Cardiac Enzymes: No results for input(s): "CKTOTAL", "CKMB", "CKMBINDEX", "TROPONINI" in the last 168 hours.  BNP: Invalid input(s): "POCBNP"  CBG:  No results for input(s): "GLUCAP" in the last 168 hours.  Microbiology: Results for orders placed or performed during the hospital encounter of 08/04/2022  Resp Panel by RT-PCR (Flu A&B, Covid) Anterior Nasal Swab     Status: None   Collection Time: 08/02/2022  6:43 PM   Specimen: Anterior Nasal Swab  Result Value Ref Range Status   SARS Coronavirus 2 by RT PCR NEGATIVE NEGATIVE Final    Comment: (NOTE) SARS-CoV-2 target nucleic acids are NOT DETECTED.  The SARS-CoV-2 RNA is generally detectable in upper respiratory specimens during the acute phase of infection. The lowest concentration of SARS-CoV-2 viral copies this assay can detect is 138 copies/mL. A negative result does not preclude SARS-Cov-2 infection and should not be used as the sole basis for treatment or other patient  management decisions. A negative result may occur with  improper specimen collection/handling, submission of specimen other than nasopharyngeal swab, presence of viral mutation(s) within the areas targeted by this assay, and inadequate number of viral copies(<138 copies/mL). A negative result must be combined with clinical observations, patient history, and epidemiological information. The expected result is Negative.  Fact Sheet for Patients:  EntrepreneurPulse.com.au  Fact Sheet for Healthcare Providers:  IncredibleEmployment.be  This test is no t yet approved or cleared by the [place] FDA and  has been authorized for detection and/or diagnosis of SARS-CoV-2 by FDA under an Emergency Use Authorization (EUA). This EUA will remain  in effect (meaning this test can be used) for the duration of the COVID-19 declaration under Section 564(b)(1) of the Act, 21 U.S.C.section 360bbb-3(b)(1), unless the authorization is terminated  or revoked sooner.       Influenza A by PCR NEGATIVE NEGATIVE Final   Influenza B by PCR NEGATIVE NEGATIVE Final    Comment: (NOTE) The Xpert Xpress SARS-CoV-2/FLU/RSV plus assay is intended as an aid in the diagnosis of influenza from Nasopharyngeal swab specimens and should not be used as a sole basis for treatment. Nasal washings and aspirates are unacceptable for Xpert Xpress SARS-CoV-2/FLU/RSV testing.  Fact Sheet for Patients: EntrepreneurPulse.com.au  Fact Sheet for Healthcare Providers: IncredibleEmployment.be  This test is not yet approved or cleared by the [place] FDA and has been authorized for detection and/or diagnosis of SARS-CoV-2 by FDA under an Emergency Use Authorization (EUA). This EUA will remain in effect (meaning this test can be used) for the duration of the COVID-19 declaration under Section 564(b)(1) of the Act, 21 U.S.C. section 360bbb-3(b)(1),  unless the authorization is terminated or revoked.  Performed at [facility], [address] [place], [place] [address]     Coagulation Studies: No results for input(s): "LABPROT", "INR" in the last 72 hours.  Urinalysis: No results for input(s): "COLORURINE", "LABSPEC", "PHURINE", "GLUCOSEU", "HGBUR", "BILIRUBINUR", "KETONESUR", "PROTEINUR", "UROBILINOGEN", "NITRITE", "LEUKOCYTESUR" in the last 72 hours.  Invalid input(s): "APPERANCEUR"    Imaging: DG Chest Port 1 View  Result Date: 07/28/2022 CLINICAL DATA:  Shortness of breath, COPD EXAM: PORTABLE CHEST 1 VIEW COMPARISON:  07/26/2022 FINDINGS: Unchanged AP portable examination. Cardiomegaly with a small right pleural effusion and perihilar masslike consolidation. Mild, diffuse bilateral interstitial pulmonary opacity. Osseous structures unremarkable. IMPRESSION: 1. Unchanged AP portable examination. Cardiomegaly with a small right pleural effusion and perihilar masslike consolidation. 2. Mild, diffuse bilateral interstitial pulmonary opacity, likely edema. No new or focal airspace opacity. Electronically Signed   By: Delanna Ahmadi M.D.   On: 07/28/2022 12:22     Medications:      amiodarone  200  mg Oral Daily   apixaban  2.5 mg Oral BID   atorvastatin  40 mg Oral Daily   citalopram  20 mg Oral Daily   feeding supplement  1 Container Oral TID BM   metoprolol tartrate  25 mg Oral BID   multivitamin with minerals  1 tablet Oral Daily   pantoprazole  40 mg Oral BID AC   polyethylene glycol  17 g Oral Daily   sodium bicarbonate  1,300 mg Oral Daily   sodium chloride flush  3 mL Intravenous Q12H   spironolactone  12.5 mg Oral Daily   torsemide  20 mg Oral Daily   acetaminophen **OR** acetaminophen, calcium carbonate, guaiFENesin, HYDROcodone-acetaminophen, melatonin, simethicone  Assessment/ Plan:  Ms. VERNEICE CASPERS is a 66 y.o.  female with past medical conditions including diabetes, hypertension, lymphedema,  breast cancer COPD and chronic kidney disease.  Patient presents to the emergency department of shortness of breath and has been admitted for Pleural effusion [J90] Status post thoracentesis [Z98.890] CHF exacerbation ([place]) [I50.9] Pleural effusion on right [J90] Acute on chronic combined systolic and diastolic CHF (congestive heart failure) (HCC) [I50.43] Acute on chronic respiratory failure with hypoxia (HCC) [J96.21]   Acute Kidney Injury on chronic kidney disease stage IIIb with baseline creatinine 1.50 and GFR of 38 on 06/16/22.  Acute kidney injury secondary to cardiorenal syndrome and diuretic therapy Chronic kidney disease is secondary to hypertension and CHF.  No IV contrast exposure.   Patient peak creatinine was at 2.7 on October 6 thereafter patient has had neck creatinine of around 1.8--2.3.  Patient creatinine is at 2.3 We will continue current treatment  Lab Results  Component Value Date   CREATININE 2.34 (H) 07/28/2022   CREATININE 1.98 (H) 07/28/2022   CREATININE 2.13 (H) 07/27/2022    Intake/Output Summary (Last 24 hours) at 07/28/2022 1556 Last data filed at 07/28/2022 1019 Gross per 24 hour  Intake 357 ml  Output 1400 ml  Net -1043 ml    2. Anemia of chronic kidney disease Lab Results  Component Value Date   HGB 10.4 (L) 07/28/2022    Hemoglobin at goal  3.  Hypertension with chronic kidney disease.  Home regimen includes torsemide, amiodarone, hydralazine, and isosorbide. Continue Midodrine 5mg  three times daily for BP support.   Patient is currently on torsemide  4.  Pleural effusions, recurrent.  Seen on chest x-ray on admission.  Radiology performed right thoracentesis on admission with 900 mL removed.  5.Combined systolic and diastolic CHF Patient is on torsemide  6.Social Patient has poor prognosis because of multiorgan disease process.  Patient is being followed by primary team and palliative care team has been consulted as well   LOS:  10 Daniyah Fohl s [facility] 10/14/20233:56 PM

## 2022-07-28 NOTE — Progress Notes (Signed)
PULMONOLOGY         Date: 07/28/2022,   MRN# [id number] Danielle [name] Mar 04, 1956     AdmissionWeight: 50.3 kg                 CurrentWeight: 47.9 kg  Referring provider: Dr Arbutus Ped   CHIEF COMPLAINT:   Recurrent pleural effusions and advanced metastatic cancer.    HISTORY OF PRESENT ILLNESS   This is a very pleasant 66yo F with hx of advanced COPD, chronic anemia, paroxysmal AFchronic anticoagulation therapy, history of chronic systolic heart failure, history of adenocarcinoma of the right lung status post radiation therapy, history of stage IIIb chronic kidney disease, aortic valve stenosis, COPD with chronic respiratory failure on 4 L of oxygen continuous, history of left breast cancer status post mastectomy, history of esophageal cancer recurrent right-sided pleural effusion requiring thoracentesis Most recently on 06/19/2022 who presents to the ER for evaluation of worsening shortness of breath from her baseline.  She has associated chest tightness. She denies having mucus on expectoration, she is on 3-4L/min during my evaluation and notes when attempting to ambulate she has dyspnea.   07/24/22- patient is mildly imprved with bronchopulmonary hygiene .  Continue current care and may initiate DC planning.   07/25/22- no acute events overnight continue current care plan.  She is close to baseline.   07/26/22- patient reports slight worsening and is now on 7L/min.    07/27/22-  patient still not improved with cxr showing ongoing edema and effusions. I have stopped her metoprolol and midodrine due to drug/drug interaction and have ordered torsemide to diurese her despite CKD.  She is overall chronically ill with severe comorbid conditions. She has palliative care consultation ordered but remains full code which seems too aggressive for her current state of health.   07/28/22- patient is on 13L/min this am which is improved from yesterday.  She is on torsemide now and seems  to be putting out more fluid.  She started to expectorate some mucus this am. Will order aldactone 12.5 in additon to torsemide.  PAST MEDICAL HISTORY   Past Medical History:  Diagnosis Date   Acute respiratory failure due to COVID-19 ([place]) 09/27/2019   Anemia    Anxiety    Aortic atherosclerosis ([place])    Aortic valve stenosis 02/10/2018   a.) TTE 02/10/2018: EF 55-60%: mild AS with MPG of 12 mmHg. b.) TTE 04/21/2018: EF 35-40%; mild AS with MPG 8 mmHg. c.) TTE 11/07/2019: EF 50-55%; mild AS with MPG 10 mmHg.   Arthritis    Atrial fibrillation (HCC)    a.) CHA2DS2-VASc = 4 (age, sex, HTN, aortic plaque). b.) Rate/rhythm maintained on oral carvedilol; chronically anticoagulated on dose reduced apixaban. c.)  Attempted deployment of LAA occlusive device on 01/11/2021; parameters failed and procedure aborted.   Breast cancer, left ([place]) 2000   a.) T2N1M0; ER/PR (+) --> Tx'd with total mastectomy, LN resection, XRT, and chemotherapy   Cancer of right lung ([place]) 07/30/2016   a.) adenocarcinoma; ALK, ROS1, PDL1, BRAF, EGFR all negative.   CHF (congestive heart failure) (HCC)    Chronic diastolic CHF (congestive heart failure) ([place]) 11/26/2021   CKD (chronic kidney disease), stage IV (HCC)    COPD (chronic obstructive pulmonary disease) (HCC)    Dependence on supplemental oxygen    Depression    Diastolic dysfunction 03/70/9643   a.) TTE 02/10/2017: EF 55-60%; G2DD. b.) TTE 04/21/2018: EF 35-40%; mild LA dilation; mod MV regurgitation. c.) TTE 11/07/2019:  EF 50-55%; G1DD.   DOE (dyspnea on exertion)    GERD (gastroesophageal reflux disease)    Heart murmur    History of 2019 novel coronavirus disease (COVID-19) 10/14/2019   HLD (hyperlipidemia)    Hypertension    Long term current use of anticoagulant    a.) apixaban   Lymphedema    Personal history of chemotherapy    Personal history of radiation therapy    Respiratory tract infection due to COVID-19 virus 09/27/2019   SBO (small  bowel obstruction) ([place]) 11/05/2020   Vitamin D deficiency      SURGICAL HISTORY   Past Surgical History:  Procedure Laterality Date   Breast Biospy Left    [facility]   BREAST SURGERY     CARDIOVERSION N/A 02/23/2022   Procedure: CARDIOVERSION;  Surgeon: Corey Skains, MD;  Location: [facility] ORS;  Service: Cardiovascular;  Laterality: N/A;   COLONOSCOPY N/A 04/30/2018   Procedure: COLONOSCOPY;  Surgeon: Virgel Manifold, MD;  Location: [facility] ENDOSCOPY;  Service: Endoscopy;  Laterality: N/A;   COLONOSCOPY N/A 07/22/2018   Procedure: COLONOSCOPY;  Surgeon: Virgel Manifold, MD;  Location: [facility] ENDOSCOPY;  Service: Endoscopy;  Laterality: N/A;   COLONOSCOPY WITH PROPOFOL N/A 09/21/2021   Procedure: COLONOSCOPY WITH PROPOFOL;  Surgeon: Benjamine Sprague, DO;  Location: [facility] ENDOSCOPY;  Service: General;  Laterality: N/A;   COLONOSCOPY WITH PROPOFOL N/A 03/11/2022   Procedure: COLONOSCOPY WITH PROPOFOL;  Surgeon: Annamaria Helling, DO;  Location[facility] ENDOSCOPY;  Service: Gastroenterology;  Laterality: N/A;   DILATION AND CURETTAGE OF UTERUS     ELECTROMAGNETIC NAVIGATION BROCHOSCOPY Right 04/11/2016   Procedure: ELECTROMAGNETIC NAVIGATION BRONCHOSCOPY;  Surgeon: Vilinda Boehringer, MD;  Location: [facility] ORS;  Service: Cardiopulmonary;  Laterality: Right;   ESOPHAGOGASTRODUODENOSCOPY N/A 07/22/2018   Procedure: ESOPHAGOGASTRODUODENOSCOPY (EGD);  Surgeon: Virgel Manifold, MD;  Location: [facility] ENDOSCOPY;  Service: Endoscopy;  Laterality: N/A;   ESOPHAGOGASTRODUODENOSCOPY (EGD) WITH PROPOFOL N/A 05/07/2018   Procedure: ESOPHAGOGASTRODUODENOSCOPY (EGD) WITH PROPOFOL;  Surgeon: Lucilla Lame, MD;  Location: [facility] ENDOSCOPY;  Service: Endoscopy;  Laterality: N/A;   ESOPHAGOGASTRODUODENOSCOPY (EGD) WITH PROPOFOL N/A 04/24/2019   Procedure: ESOPHAGOGASTRODUODENOSCOPY (EGD) WITH PROPOFOL;  Surgeon: Jonathon Bellows, MD;  Location: [facility] ENDOSCOPY;  Service: Gastroenterology;  Laterality: N/A;    ESOPHAGOGASTRODUODENOSCOPY (EGD) WITH PROPOFOL N/A 01/12/2020   Procedure: ESOPHAGOGASTRODUODENOSCOPY (EGD) WITH PROPOFOL;  Surgeon: Jonathon Bellows, MD;  Location: [facility] ENDOSCOPY;  Service: Gastroenterology;  Laterality: N/A;   ESOPHAGOGASTRODUODENOSCOPY (EGD) WITH PROPOFOL N/A 04/28/2020   Procedure: ESOPHAGOGASTRODUODENOSCOPY (EGD) WITH PROPOFOL;  Surgeon: Jonathon Bellows, MD;  Location: [facility] ENDOSCOPY;  Service: Gastroenterology;  Laterality: N/A;   ESOPHAGOGASTRODUODENOSCOPY (EGD) WITH PROPOFOL N/A 03/11/2022   Procedure: ESOPHAGOGASTRODUODENOSCOPY (EGD) WITH PROPOFOL;  Surgeon: Annamaria Helling, DO;  Location: [facility] ENDOSCOPY;  Service: Gastroenterology;  Laterality: N/A;   EUS N/A 05/07/2019   Procedure: FULL UPPER ENDOSCOPIC ULTRASOUND (EUS) RADIAL;  Surgeon: Jola Schmidt, MD;  Location: [facility] ENDOSCOPY;  Service: Endoscopy;  Laterality: N/A;   ILEOSCOPY N/A 07/22/2018   Procedure: ILEOSCOPY THROUGH STOMA;  Surgeon: Virgel Manifold, MD;  Location: [facility] ENDOSCOPY;  Service: Endoscopy;  Laterality: N/A;   ILEOSTOMY     ILEOSTOMY N/A 09/08/2018   Procedure: ILEOSTOMY REVISION POSSIBLE CREATION;  Surgeon: Herbert Pun, MD;  Location: [facility] ORS;  Service: General;  Laterality: N/A;   ILEOSTOMY CLOSURE N/A 08/15/2018   Procedure: DILATION OF ILEOSTOMY STRICTURE;  Surgeon: Herbert Pun, MD;  Location: [facility] ORS;  Service: General;  Laterality: N/A;   LAPAROTOMY Right 05/04/2018   Procedure: EXPLORATORY  LAPAROTOMY right colectomy right and left ostomy;  Surgeon: Herbert Pun, MD;  Location: [facility] ORS;  Service: General;  Laterality: Right;   LEFT ATRIAL APPENDAGE OCCLUSION N/A 01/11/2021   Procedure: LEFT ATRIAL APPENDAGE OCCLUSION ([place]); ABORTED PROCEDURE WITHOUT DEVICE BEING IMPLANTED; Location: [facility]; Surgeon: Mylinda Latina, MD   LUNG BIOPSY     MASTECTOMY Left    2000, [place] Right    [place]   XI ROBOTIC ASSISTED COLOSTOMY TAKEDOWN N/A  10/23/2021   Procedure: XI ROBOTIC ASSISTED ILEOSTOMY TAKEDOWN;  Surgeon: Herbert Pun, MD;  Location: [facility] ORS;  Service: General;  Laterality: N/A;  180 minutes for the surgery part please     FAMILY HISTORY   Family History  Problem Relation Age of Onset   Breast cancer Mother 75   Cancer Mother        Breast    Cirrhosis Father    Breast cancer Paternal Aunt 77   Cancer Maternal Aunt        Breast      SOCIAL HISTORY   Social History   Tobacco Use   Smoking status: Former    Packs/day: 0.50    Years: 20.00    Total pack years: 10.00    Types: Cigarettes    Quit date: 07/02/2012    Years since quitting: 10.0   Smokeless tobacco: Current    Types: Snuff  Vaping Use   Vaping Use: Never used  Substance Use Topics   Alcohol use: Yes    Alcohol/week: 3.0 standard drinks of alcohol    Types: 3 Cans of beer per week    Comment: Occasionally beer   Drug use: No     MEDICATIONS    Home Medication:    Current Medication:  Current Facility-Administered Medications:    acetaminophen (TYLENOL) tablet 650 mg, 650 mg, Oral, Q6H PRN, 650 mg at 07/27/22 1054 **OR** acetaminophen (TYLENOL) suppository 650 mg, 650 mg, Rectal, Q6H PRN, Athena Masse, MD   amiodarone (PACERONE) tablet 200 mg, 200 mg, Oral, Daily, Judd Gaudier V, MD, 200 mg at 07/28/22 0915   apixaban (ELIQUIS) tablet 2.5 mg, 2.5 mg, Oral, BID, Judd Gaudier V, MD, 2.5 mg at 07/28/22 0915   atorvastatin (LIPITOR) tablet 40 mg, 40 mg, Oral, Daily, Judd Gaudier V, MD, 40 mg at 07/28/22 0915   calcium carbonate (TUMS - dosed in mg elemental calcium) chewable tablet 200 mg of elemental calcium, 1 tablet, Oral, TID PRN, Nicole Kindred A, DO, 200 mg of elemental calcium at 07/24/22 2203   citalopram (CELEXA) tablet 20 mg, 20 mg, Oral, Daily, Nicole Kindred A, DO, 20 mg at 07/28/22 0915   feeding supplement (BOOST / RESOURCE BREEZE) liquid 1 Container, 1 Container, Oral, TID BM, Nicole Kindred A, DO, 1  Container at 07/28/22 0916   guaiFENesin (MUCINEX) 12 hr tablet 1,200 mg, 1,200 mg, Oral, BID PRN, Gwynne Edinger, MD, 1,200 mg at 07/27/22 1054   HYDROcodone-acetaminophen (NORCO/VICODIN) 5-325 MG per tablet 1-2 tablet, 1-2 tablet, Oral, Q4H PRN, Athena Masse, MD, 2 tablet at 07/27/22 1944   melatonin tablet 5 mg, 5 mg, Oral, QHS PRN, Sharion Settler, NP, 5 mg at 07/27/22 2309   metoprolol tartrate (LOPRESSOR) tablet 25 mg, 25 mg, Oral, BID, [place], Ailene Rud, MD, 25 mg at 07/28/22 0915   multivitamin with minerals tablet 1 tablet, 1 tablet, Oral, Daily, Nicole Kindred A, DO, 1 tablet at 07/28/22 0915   pantoprazole (PROTONIX) EC tablet 40 mg, 40  mg, Oral, BID AC, Judd Gaudier V, MD, 40 mg at 07/28/22 0915   polyethylene glycol (MIRALAX / GLYCOLAX) packet 17 g, 17 g, Oral, Daily, Breeze, Shantelle, NP, 17 g at 07/28/22 0915   simethicone (MYLICON) 40 JW/1.1BJ suspension 40 mg, 40 mg, Oral, QID PRN, Oswald Hillock, RPH, 40 mg at 07/28/22 4782   sodium bicarbonate tablet 1,300 mg, 1,300 mg, Oral, Daily, Judd Gaudier V, MD, 1,300 mg at 07/28/22 0915   sodium chloride flush (NS) 0.9 % injection 3 mL, 3 mL, Intravenous, Q12H, Nicole Kindred A, DO, 3 mL at 07/27/22 2125   torsemide (DEMADEX) tablet 20 mg, 20 mg, Oral, Daily, Ottie Glazier, MD, 20 mg at 07/28/22 0915    ALLERGIES   Patient has no known allergies.     REVIEW OF SYSTEMS    Review of Systems:  Gen:  Denies  fever, sweats, chills weigh loss  HEENT: Denies blurred vision, double vision, ear pain, eye pain, hearing loss, nose bleeds, sore throat Cardiac:  No dizziness, chest pain or heaviness, chest tightness,edema Resp:   reports dyspnea chronically  Gi: Denies swallowing difficulty, stomach pain, nausea or vomiting, diarrhea, constipation, bowel incontinence Gu:  Denies bladder incontinence, burning urine Ext:   Denies Joint pain, stiffness or swelling Skin: Denies  skin rash, easy bruising or bleeding or  hives Endoc:  Denies polyuria, polydipsia , polyphagia or weight change Psych:   Denies depression, insomnia or hallucinations   Other:  All other systems negative   VS: BP 123/80 (BP Location: Right Arm)   Pulse 86   Temp 97.8 F (36.6 C)   Resp 18   Ht $R'5\' 6"'sg$  (1.676 m)   Wt 47.9 kg   SpO2 94%   BMI 17.04 kg/m      PHYSICAL EXAM    GENERAL:NAD, no fevers, chills, no weakness no fatigue HEAD: Normocephalic, atraumatic.  EYES: Pupils equal, round, reactive to light. Extraocular muscles intact. No scleral icterus.  MOUTH: Moist mucosal membrane. Dentition intact. No abscess noted.  EAR, NOSE, THROAT: Clear without exudates. No external lesions.  NECK: Supple. No thyromegaly. No nodules. No JVD.  PULMONARY: decreased breath sounds with mild rhonchi worse at bases bilaterally.  CARDIOVASCULAR: S1 and S2. Regular rate and rhythm. No murmurs, rubs, or gallops. No edema. Pedal pulses 2+ bilaterally.  GASTROINTESTINAL: Soft, nontender, nondistended. No masses. Positive bowel sounds. No hepatosplenomegaly.  MUSCULOSKELETAL: No swelling, clubbing, or edema. Range of motion full in all extremities.  NEUROLOGIC: Cranial nerves II through XII are intact. No gross focal neurological deficits. Sensation intact. Reflexes intact.  SKIN: No ulceration, lesions, rashes, or cyanosis. Skin warm and dry. Turgor intact.  PSYCHIATRIC: Mood, affect within normal limits. The patient is awake, alert and oriented x 3. Insight, judgment intact.       IMAGING     ASSESSMENT/PLAN   Acute on chronic hypoxemic respiratory failure -patient with advanced COPD/CHF/CKD/lung cancer -she is with a least moderate severity physical deconditioning -all of these comorbidities are currently playing role in dyspnea -Advanced COPD - continue ICS/LABA/LAMA with Breo Ellipta and Incruse and addition of PRN albuterol nebulizer  -CHF/CKD overlap- adding aldactone 12.5 and reducing metoprolol from 25 to 12.5 bid to  allow hemodynamic leveling with midodrine and Atrial fibrillation while diuresing 3. Atelectasis - worse at RLL - Metaneb with saline q8h 4. Right pleural effusion -  adding more diuresis, holding thoracentesis due to high likelyhood of rapid re-accumulation 5. Physical deconditioning- patient is currently with PT/OT and would benefit  from same on outpatient 6. Atrial fibrillation - chronic stable - continue eliquis, metoprolol, amiodarone          Thank you for allowing me to participate in the care of this patient.   Patient/Family are satisfied with care plan and all questions have been answered.    Provider disclosure: Patient with at least one acute or chronic illness or injury that poses a threat to life or bodily function and is being managed actively during this encounter.  All of the below services have been performed independently by signing provider:  review of prior documentation from internal and or external health records.  Review of previous and current lab results.  Interview and comprehensive assessment during patient visit today. Review of current and previous chest radiographs/CT scans. Discussion of management and test interpretation with health care team and patient/family.   This document was prepared using Dragon voice recognition software and may include unintentional dictation errors.     Ottie Glazier, M.D.  Division of Pulmonary & Critical Care Medicine

## 2022-07-29 ENCOUNTER — Inpatient Hospital Stay: Payer: Medicare Other

## 2022-07-29 ENCOUNTER — Encounter: Payer: Self-pay | Admitting: Internal Medicine

## 2022-07-29 DIAGNOSIS — I5043 Acute on chronic combined systolic (congestive) and diastolic (congestive) heart failure: Secondary | ICD-10-CM | POA: Diagnosis not present

## 2022-07-29 LAB — BASIC METABOLIC PANEL
Anion gap: 10 (ref 5–15)
BUN: 57 mg/dL — ABNORMAL HIGH (ref 8–23)
CO2: 27 mmol/L (ref 22–32)
Calcium: 8.9 mg/dL (ref 8.9–10.3)
Chloride: 105 mmol/L (ref 98–111)
Creatinine, Ser: 2.45 mg/dL — ABNORMAL HIGH (ref 0.44–1.00)
GFR, Estimated: 21 mL/min — ABNORMAL LOW (ref >60)
Glucose, Bld: 101 mg/dL — ABNORMAL HIGH (ref 70–99)
Potassium: 4.4 mmol/L (ref 3.5–5.1)
Sodium: 142 mmol/L (ref 135–145)

## 2022-07-29 MED ORDER — FUROSEMIDE 10 MG/ML IJ SOLN
40.0000 mg | Freq: Once | INTRAMUSCULAR | Status: AC
  Administered 2022-07-29: 40 mg via INTRAVENOUS
  Filled 2022-07-29: qty 4

## 2022-07-29 MED ORDER — METOPROLOL TARTRATE 25 MG PO TABS
12.5000 mg | ORAL_TABLET | Freq: Two times a day (BID) | ORAL | Status: DC
  Administered 2022-07-29 – 2022-08-02 (×9): 12.5 mg via ORAL
  Filled 2022-07-29 (×9): qty 1

## 2022-07-29 NOTE — Progress Notes (Signed)
[facility]  ROUNDING NOTE   Subjective:   Danielle [name] 66 year old female with past medical conditions including diabetes, hypertension, lymphedema, breast cancer COPD and chronic kidney disease.  Patient presents to the emergency department of shortness of breath and has been admitted for Pleural effusion [J90] Status post thoracentesis [Z98.890] CHF exacerbation (HCC) [I50.9] Pleural effusion on right [J90] Acute on chronic combined systolic and diastolic CHF (congestive heart failure) (HCC) [I50.43] Acute on chronic respiratory failure with hypoxia ([place]) [J96.21]  Patient is known to our practice and is followed outpatient by Dr. Holley Raring.  She was last seen in office on April 19, 2022.   Patient was seen today on second floor Patient main concern today visit was that she was feeling short of breath on minimal ambulation.  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  Patient voiced understanding   Objective:  Vital signs in last 24 hours:  Temp:  [97.7 F (36.5 C)-98 F (36.7 C)] 98 F (36.7 C) (10/15 0759) Pulse Rate:  [62-110] 84 (10/15 0759) Resp:  [17-20] 17 (10/15 0759) BP: (86-114)/(64-87) 102/64 (10/15 0759) SpO2:  [91 %-100 %] 96 % (10/15 0759)  Weight change:  Filed Weights   07/25/22 0352 07/27/22 0449 07/28/22 0419  Weight: 51.1 kg 51.2 kg 47.9 kg    Intake/Output: I/O last 3 completed shifts: In: 357 [P.O.:357] Out: 700 [Urine:700]   Intake/Output this shift:  No intake/output data recorded.  Physical Exam: General: Sitting side of bed  Head: Normocephalic, atraumatic. Moist oral mucosal membranes  Eyes: Anicteric  Lungs:  Mild respiratory distress,Decreased breath sounds at bases, Tachypnea  Heart: Irregular rate and rhythm  Abdomen:  Soft, tender, nondistended  Extremities: No peripheral edema.   Neurologic: Nonfocal, moving all four extremities  Skin: No lesions  Access: None    Basic Metabolic Panel: Recent Labs  Lab 07/24/22 0435 07/25/22 0443 07/26/22 0409 07/27/22 0710 07/28/22 0425 07/28/22 1410 07/29/22 0416  NA 141 140 141 143 141 140 142  K 4.1 4.2 4.3 4.1 4.3 4.1 4.4  CL 106 105 110 109 104 102 105  CO2 26 25 26 25 27 27 27   GLUCOSE 86 93 97 86 72 168* 101*  BUN 38* 42* 46* 47* 46* 48* 57*  CREATININE 1.96* 2.27* 2.32* 2.13* 1.98* 2.34* 2.45*  CALCIUM 9.1 8.9 8.5* 9.1 9.1 9.0 8.9  MG 1.7 2.2  --   --   --   --   --   PHOS  --   --  6.0*  --   --   --   --     Liver Function Tests: Recent Labs  Lab 07/26/22 0409  ALBUMIN 3.1*   No results for input(s): "LIPASE", "AMYLASE" in the last 168 hours. No results for input(s): "AMMONIA" in the last 168 hours.  CBC: Recent Labs  Lab 07/28/22 1410  WBC 6.6  NEUTROABS 4.7  HGB 10.4*  HCT 32.8*  MCV 97.9  PLT 230    Cardiac Enzymes: No results for input(s): "CKTOTAL", "CKMB", "CKMBINDEX", "TROPONINI" in the last 168 hours.  BNP: Invalid input(s): "POCBNP"  CBG: No results for input(s): "GLUCAP" in the last 168 hours.  Microbiology: Results for orders placed or performed during the hospital encounter of 08/03/2022  Resp Panel by RT-PCR (Flu A&B, Covid) Anterior Nasal Swab     Status: None   Collection Time:  07/22/2022  6:43 PM   Specimen: Anterior Nasal Swab  Result Value Ref Range Status   SARS Coronavirus 2 by RT PCR NEGATIVE NEGATIVE Final    Comment: (NOTE) SARS-CoV-2 target nucleic acids are NOT DETECTED.  The SARS-CoV-2 RNA is generally detectable in upper respiratory specimens during the acute phase of infection. The lowest concentration of SARS-CoV-2 viral copies this assay can detect is 138 copies/mL. A negative result does not preclude SARS-Cov-2 infection and should not be used as the sole basis for treatment or other patient management decisions. A negative result may occur with   improper specimen collection/handling, submission of specimen other than nasopharyngeal swab, presence of viral mutation(s) within the areas targeted by this assay, and inadequate number of viral copies(<138 copies/mL). A negative result must be combined with clinical observations, patient history, and epidemiological information. The expected result is Negative.  Fact Sheet for Patients:  EntrepreneurPulse.com.au  Fact Sheet for Healthcare Providers:  IncredibleEmployment.be  This test is no t yet approved or cleared by the [place] FDA and  has been authorized for detection and/or diagnosis of SARS-CoV-2 by FDA under an Emergency Use Authorization (EUA). This EUA will remain  in effect (meaning this test can be used) for the duration of the COVID-19 declaration under Section 564(b)(1) of the Act, 21 U.S.C.section 360bbb-3(b)(1), unless the authorization is terminated  or revoked sooner.       Influenza A by PCR NEGATIVE NEGATIVE Final   Influenza B by PCR NEGATIVE NEGATIVE Final    Comment: (NOTE) The Xpert Xpress SARS-CoV-2/FLU/RSV plus assay is intended as an aid in the diagnosis of influenza from Nasopharyngeal swab specimens and should not be used as a sole basis for treatment. Nasal washings and aspirates are unacceptable for Xpert Xpress SARS-CoV-2/FLU/RSV testing.  Fact Sheet for Patients: EntrepreneurPulse.com.au  Fact Sheet for Healthcare Providers: IncredibleEmployment.be  This test is not yet approved or cleared by the [place] FDA and has been authorized for detection and/or diagnosis of SARS-CoV-2 by FDA under an Emergency Use Authorization (EUA). This EUA will remain in effect (meaning this test can be used) for the duration of the COVID-19 declaration under Section 564(b)(1) of the Act, 21 U.S.C. section 360bbb-3(b)(1), unless the authorization is terminated  or revoked.  Performed at [facility], [address] [place], [place] [address]     Coagulation Studies: No results for input(s): "LABPROT", "INR" in the last 72 hours.  Urinalysis: No results for input(s): "COLORURINE", "LABSPEC", "PHURINE", "GLUCOSEU", "HGBUR", "BILIRUBINUR", "KETONESUR", "PROTEINUR", "UROBILINOGEN", "NITRITE", "LEUKOCYTESUR" in the last 72 hours.  Invalid input(s): "APPERANCEUR"    Imaging: DG Chest Port 1 View  Result Date: 07/28/2022 CLINICAL DATA:  Shortness of breath, COPD EXAM: PORTABLE CHEST 1 VIEW COMPARISON:  07/26/2022 FINDINGS: Unchanged AP portable examination. Cardiomegaly with a small right pleural effusion and perihilar masslike consolidation. Mild, diffuse bilateral interstitial pulmonary opacity. Osseous structures unremarkable. IMPRESSION: 1. Unchanged AP portable examination. Cardiomegaly with a small right pleural effusion and perihilar masslike consolidation. 2. Mild, diffuse bilateral interstitial pulmonary opacity, likely edema. No new or focal airspace opacity. Electronically Signed   By: Delanna Ahmadi M.D.   On: 07/28/2022 12:22     Medications:      amiodarone  200 mg Oral Daily   apixaban  2.5 mg Oral BID   atorvastatin  40 mg Oral Daily   citalopram  20 mg Oral Daily   feeding supplement  1 Container Oral TID BM   metoprolol tartrate  25 mg Oral  BID   multivitamin with minerals  1 tablet Oral Daily   pantoprazole  40 mg Oral BID AC   polyethylene glycol  17 g Oral Daily   sodium bicarbonate  1,300 mg Oral Daily   sodium chloride flush  3 mL Intravenous Q12H   spironolactone  12.5 mg Oral Daily   torsemide  20 mg Oral Daily   acetaminophen **OR** acetaminophen, calcium carbonate, guaiFENesin, HYDROcodone-acetaminophen, melatonin, simethicone  Assessment/ Plan:  Danielle [name] is a 66 y.o.  female with past medical conditions including diabetes, hypertension, lymphedema, breast cancer COPD and chronic kidney  disease.  Patient presents to the emergency department of shortness of breath and has been admitted for Pleural effusion [J90] Status post thoracentesis [Z98.890] CHF exacerbation ([place]) [I50.9] Pleural effusion on right [J90] Acute on chronic combined systolic and diastolic CHF (congestive heart failure) (HCC) [I50.43] Acute on chronic respiratory failure with hypoxia (HCC) [J96.21]   Acute Kidney Injury on chronic kidney disease stage IIIb with baseline creatinine 1.50 and GFR of 38 on 06/16/22.  Acute kidney injury secondary to cardiorenal syndrome and diuretic therapy Chronic kidney disease is secondary to hypertension and CHF.  No IV contrast exposure.   Patient peak creatinine was at 2.7 on October 6 thereafter . Patient creatinine has worsened recently from 2.0 to now 2.5. Educated patient about the worsening of her kidney disease.  We will continue patient on diuretics  Lab Results  Component Value Date   CREATININE 2.45 (H) 07/29/2022   CREATININE 2.34 (H) 07/28/2022   CREATININE 1.98 (H) 07/28/2022    Intake/Output Summary (Last 24 hours) at 07/29/2022 0918 Last data filed at 07/28/2022 1908 Gross per 24 hour  Intake --  Output 200 ml  Net -200 ml    2. Anemia of chronic kidney disease Lab Results  Component Value Date   HGB 10.4 (L) 07/28/2022    Hemoglobin at goal  3.  Hypertension with chronic kidney disease.  Home regimen includes torsemide, amiodarone, hydralazine, and isosorbide. Continue Midodrine 5mg  three times daily for BP support.   Patient is currently on torsemide  4.  Pleural effusions, recurrent.  Seen on chest x-ray on admission.  Radiology performed right thoracentesis on admission with 900 mL removed.  5.Combined systolic and diastolic CHF Patient is on torsemide  6.Social Patient has poor prognosis because of multiorgan disease process.  Patient is being followed by primary team and palliative care team has been consulted as well   LOS:  11 Nickalos Petersen s [facility] 10/15/20239:18 AM

## 2022-07-29 NOTE — TOC Progression Note (Addendum)
Transition of Care [facility]) - Progression Note    Patient Details  Name: Danielle [name] MRN: [id number] Date of Birth: 11/26/1955  Transition [facility] Care [facility]) CM/SW Contact  Izola Price, RN Phone Number: 07/29/2022, 11:59 AM  Clinical Narrative: 10/15: Latricia Heft declined for [facility] PT. Alvis Lemmings accepted for [facility] to notify patient and she indicated she would think about it despite prior notes indicating she had changed her mind. I told her it was in place and she could decide and let us know closer to discharge if you would like to do that. She verbally agreed to that plan for now. Requested [place] orders from provider. Simmie Davies RN CM     Expected Discharge Plan: Home/Self Care Barriers to Discharge: Continued Medical Work up  Expected Discharge Plan and Services Expected Discharge Plan: Home/Self Care   Discharge Planning Services: CM Consult   Living arrangements for the past 2 months: Single Family Home                                       Social Determinants of Health (SDOH) Interventions    Readmission Risk Interventions    05/09/2022    3:11 PM 01/29/2022   11:36 AM 11/29/2021   10:49 AM  Readmission Risk Prevention Plan  Transportation Screening Complete Complete Complete  Medication Review [employer]) Complete Complete Complete  PCP or Specialist appointment within 3-5 days of discharge  Complete Complete  HRI or Home Care Consult Patient refused  Complete  SW Recovery Care/Counseling Consult Complete Complete Complete  Palliative Care Screening Not Applicable Not Applicable Not [place] Not Applicable Not Applicable Not Applicable

## 2022-07-29 NOTE — Progress Notes (Signed)
PULMONOLOGY         Date: 07/29/2022,   MRN# [id number] Danielle [name] 13-Jun-1956     AdmissionWeight: 50.3 kg                 CurrentWeight: 47.9 kg  Referring provider: Dr Arbutus Ped   CHIEF COMPLAINT:   Recurrent pleural effusions and advanced metastatic cancer.    HISTORY OF PRESENT ILLNESS   This is a very pleasant 66yo F with hx of advanced COPD, chronic anemia, paroxysmal AFchronic anticoagulation therapy, history of chronic systolic heart failure, history of adenocarcinoma of the right lung status post radiation therapy, history of stage IIIb chronic kidney disease, aortic valve stenosis, COPD with chronic respiratory failure on 4 L of oxygen continuous, history of left breast cancer status post mastectomy, history of esophageal cancer recurrent right-sided pleural effusion requiring thoracentesis Most recently on 06/19/2022 who presents to the ER for evaluation of worsening shortness of breath from her baseline.  She has associated chest tightness. She denies having mucus on expectoration, she is on 3-4L/min during my evaluation and notes when attempting to ambulate she has dyspnea.   07/24/22- patient is mildly imprved with bronchopulmonary hygiene .  Continue current care and may initiate DC planning.   07/25/22- no acute events overnight continue current care plan.  She is close to baseline.   07/26/22- patient reports slight worsening and is now on 7L/min.    07/27/22-  patient still not improved with cxr showing ongoing edema and effusions. I have stopped her metoprolol and midodrine due to drug/drug interaction and have ordered torsemide to diurese her despite CKD.  She is overall chronically ill with severe comorbid conditions. She has palliative care consultation ordered but remains full code which seems too aggressive for her current state of health.   07/28/22- patient is on 13L/min this am which is improved from yesterday.  She is on torsemide now and seems  to be putting out more fluid.  She started to expectorate some mucus this am. Will order aldactone 12.5 in additon to torsemide.  07/29/22- patient mildly improved will continue diuresis wit fluid restricted diet  PAST MEDICAL HISTORY   Past Medical History:  Diagnosis Date   Acute respiratory failure due to COVID-19 ([place]) 09/27/2019   Anemia    Anxiety    Aortic atherosclerosis ([place])    Aortic valve stenosis 02/10/2018   a.) TTE 02/10/2018: EF 55-60%: mild AS with MPG of 12 mmHg. b.) TTE 04/21/2018: EF 35-40%; mild AS with MPG 8 mmHg. c.) TTE 11/07/2019: EF 50-55%; mild AS with MPG 10 mmHg.   Arthritis    Atrial fibrillation (HCC)    a.) CHA2DS2-VASc = 4 (age, sex, HTN, aortic plaque). b.) Rate/rhythm maintained on oral carvedilol; chronically anticoagulated on dose reduced apixaban. c.)  Attempted deployment of LAA occlusive device on 01/11/2021; parameters failed and procedure aborted.   Breast cancer, left ([place]) 2000   a.) T2N1M0; ER/PR (+) --> Tx'd with total mastectomy, LN resection, XRT, and chemotherapy   Cancer of right lung ([place]) 07/30/2016   a.) adenocarcinoma; ALK, ROS1, PDL1, BRAF, EGFR all negative.   CHF (congestive heart failure) (HCC)    Chronic diastolic CHF (congestive heart failure) ([place]) 11/26/2021   CKD (chronic kidney disease), stage IV (HCC)    COPD (chronic obstructive pulmonary disease) (HCC)    Dependence on supplemental oxygen    Depression    Diastolic dysfunction 09/98/3382   a.) TTE 02/10/2017: EF 55-60%; G2DD. b.) TTE  04/21/2018: EF 35-40%; mild LA dilation; mod MV regurgitation. c.) TTE 11/07/2019: EF 50-55%; G1DD.   DOE (dyspnea on exertion)    GERD (gastroesophageal reflux disease)    Heart murmur    History of 2019 novel coronavirus disease (COVID-19) 10/14/2019   HLD (hyperlipidemia)    Hypertension    Long term current use of anticoagulant    a.) apixaban   Lymphedema    Personal history of chemotherapy    Personal history of radiation  therapy    Respiratory tract infection due to COVID-19 virus 09/27/2019   SBO (small bowel obstruction) ([place]) 11/05/2020   Vitamin D deficiency      SURGICAL HISTORY   Past Surgical History:  Procedure Laterality Date   Breast Biospy Left    [facility]   BREAST SURGERY     CARDIOVERSION N/A 02/23/2022   Procedure: CARDIOVERSION;  Surgeon: Corey Skains, MD;  Location: [facility] ORS;  Service: Cardiovascular;  Laterality: N/A;   COLONOSCOPY N/A 04/30/2018   Procedure: COLONOSCOPY;  Surgeon: Virgel Manifold, MD;  Location: [facility] ENDOSCOPY;  Service: Endoscopy;  Laterality: N/A;   COLONOSCOPY N/A 07/22/2018   Procedure: COLONOSCOPY;  Surgeon: Virgel Manifold, MD;  Location: [facility] ENDOSCOPY;  Service: Endoscopy;  Laterality: N/A;   COLONOSCOPY WITH PROPOFOL N/A 09/21/2021   Procedure: COLONOSCOPY WITH PROPOFOL;  Surgeon: Benjamine Sprague, DO;  Location: [facility] ENDOSCOPY;  Service: General;  Laterality: N/A;   COLONOSCOPY WITH PROPOFOL N/A 03/11/2022   Procedure: COLONOSCOPY WITH PROPOFOL;  Surgeon: Annamaria Helling, DO;  Location: [facility] ENDOSCOPY;  Service: Gastroenterology;  Laterality: N/A;   DILATION AND CURETTAGE OF UTERUS     ELECTROMAGNETIC NAVIGATION BROCHOSCOPY Right 04/11/2016   Procedure: ELECTROMAGNETIC NAVIGATION BRONCHOSCOPY;  Surgeon: Vilinda Boehringer, MD;  Location: [facility] ORS;  Service: Cardiopulmonary;  Laterality: Right;   ESOPHAGOGASTRODUODENOSCOPY N/A 07/22/2018   Procedure: ESOPHAGOGASTRODUODENOSCOPY (EGD);  Surgeon: Virgel Manifold, MD;  Location: [facility] ENDOSCOPY;  Service: Endoscopy;  Laterality: N/A;   ESOPHAGOGASTRODUODENOSCOPY (EGD) WITH PROPOFOL N/A 05/07/2018   Procedure: ESOPHAGOGASTRODUODENOSCOPY (EGD) WITH PROPOFOL;  Surgeon: Lucilla Lame, MD;  Location: [facility] ENDOSCOPY;  Service: Endoscopy;  Laterality: N/A;   ESOPHAGOGASTRODUODENOSCOPY (EGD) WITH PROPOFOL N/A 04/24/2019   Procedure: ESOPHAGOGASTRODUODENOSCOPY (EGD) WITH PROPOFOL;  Surgeon: Jonathon Bellows, MD;   Location: [facility] ENDOSCOPY;  Service: Gastroenterology;  Laterality: N/A;   ESOPHAGOGASTRODUODENOSCOPY (EGD) WITH PROPOFOL N/A 01/12/2020   Procedure: ESOPHAGOGASTRODUODENOSCOPY (EGD) WITH PROPOFOL;  Surgeon: Jonathon Bellows, MD;  Location: [facility] ENDOSCOPY;  Service: Gastroenterology;  Laterality: N/A;   ESOPHAGOGASTRODUODENOSCOPY (EGD) WITH PROPOFOL N/A 04/28/2020   Procedure: ESOPHAGOGASTRODUODENOSCOPY (EGD) WITH PROPOFOL;  Surgeon: Jonathon Bellows, MD;  Location: [facility] ENDOSCOPY;  Service: Gastroenterology;  Laterality: N/A;   ESOPHAGOGASTRODUODENOSCOPY (EGD) WITH PROPOFOL N/A 03/11/2022   Procedure: ESOPHAGOGASTRODUODENOSCOPY (EGD) WITH PROPOFOL;  Surgeon: Annamaria Helling, DO;  Location: [facility] ENDOSCOPY;  Service: Gastroenterology;  Laterality: N/A;   EUS N/A 05/07/2019   Procedure: FULL UPPER ENDOSCOPIC ULTRASOUND (EUS) RADIAL;  Surgeon: Jola Schmidt, MD;  Location: [facility] ENDOSCOPY;  Service: Endoscopy;  Laterality: N/A;   ILEOSCOPY N/A 07/22/2018   Procedure: ILEOSCOPY THROUGH STOMA;  Surgeon: Virgel Manifold, MD;  Location: [facility] ENDOSCOPY;  Service: Endoscopy;  Laterality: N/A;   ILEOSTOMY     ILEOSTOMY N/A 09/08/2018   Procedure: ILEOSTOMY REVISION POSSIBLE CREATION;  Surgeon: Herbert Pun, MD;  Location: [facility] ORS;  Service: General;  Laterality: N/A;   ILEOSTOMY CLOSURE N/A 08/15/2018   Procedure: DILATION OF ILEOSTOMY STRICTURE;  Surgeon: Herbert Pun, MD;  Location: [facility] ORS;  Service: General;  Laterality: N/A;   LAPAROTOMY Right 05/04/2018   Procedure: EXPLORATORY LAPAROTOMY right colectomy right and left ostomy;  Surgeon: Herbert Pun, MD;  Location: [facility] ORS;  Service: General;  Laterality: Right;   LEFT ATRIAL APPENDAGE OCCLUSION N/A 01/11/2021   Procedure: LEFT ATRIAL APPENDAGE OCCLUSION ([place]); ABORTED PROCEDURE WITHOUT DEVICE BEING IMPLANTED; Location: [facility]; Surgeon: Mylinda Latina, MD   LUNG BIOPSY     MASTECTOMY Left    2000, [place] Right    [place]   XI ROBOTIC ASSISTED COLOSTOMY TAKEDOWN N/A 10/23/2021   Procedure: XI ROBOTIC ASSISTED ILEOSTOMY TAKEDOWN;  Surgeon: Herbert Pun, MD;  Location: [facility] ORS;  Service: General;  Laterality: N/A;  180 minutes for the surgery part please     FAMILY HISTORY   Family History  Problem Relation Age of Onset   Breast cancer Mother 7   Cancer Mother        Breast    Cirrhosis Father    Breast cancer Paternal Aunt 52   Cancer Maternal Aunt        Breast      SOCIAL HISTORY   Social History   Tobacco Use   Smoking status: Former    Packs/day: 0.50    Years: 20.00    Total pack years: 10.00    Types: Cigarettes    Quit date: 07/02/2012    Years since quitting: 10.0   Smokeless tobacco: Current    Types: Snuff  Vaping Use   Vaping Use: Never used  Substance Use Topics   Alcohol use: Yes    Alcohol/week: 3.0 standard drinks of alcohol    Types: 3 Cans of beer per week    Comment: Occasionally beer   Drug use: No     MEDICATIONS    Home Medication:    Current Medication:  Current Facility-Administered Medications:    acetaminophen (TYLENOL) tablet 650 mg, 650 mg, Oral, Q6H PRN, 650 mg at 07/27/22 1054 **OR** acetaminophen (TYLENOL) suppository 650 mg, 650 mg, Rectal, Q6H PRN, Athena Masse, MD   amiodarone (PACERONE) tablet 200 mg, 200 mg, Oral, Daily, Judd Gaudier V, MD, 200 mg at 07/28/22 0915   apixaban (ELIQUIS) tablet 2.5 mg, 2.5 mg, Oral, BID, Judd Gaudier V, MD, 2.5 mg at 07/28/22 2136   atorvastatin (LIPITOR) tablet 40 mg, 40 mg, Oral, Daily, Judd Gaudier V, MD, 40 mg at 07/28/22 0915   calcium carbonate (TUMS - dosed in mg elemental calcium) chewable tablet 200 mg of elemental calcium, 1 tablet, Oral, TID PRN, Nicole Kindred A, DO, 200 mg of elemental calcium at 07/24/22 2203   citalopram (CELEXA) tablet 20 mg, 20 mg, Oral, Daily, Nicole Kindred A, DO, 20 mg at 07/28/22 0915   feeding supplement (BOOST / RESOURCE  BREEZE) liquid 1 Container, 1 Container, Oral, TID BM, Nicole Kindred A, DO, 1 Container at 07/28/22 2136   guaiFENesin (MUCINEX) 12 hr tablet 1,200 mg, 1,200 mg, Oral, BID PRN, Gwynne Edinger, MD, 1,200 mg at 07/28/22 1609   HYDROcodone-acetaminophen (NORCO/VICODIN) 5-325 MG per tablet 1-2 tablet, 1-2 tablet, Oral, Q4H PRN, Athena Masse, MD, 1 tablet at 07/28/22 2136   melatonin tablet 5 mg, 5 mg, Oral, QHS PRN, Sharion Settler, NP, 5 mg at 07/28/22 2136   metoprolol tartrate (LOPRESSOR) tablet 25 mg, 25 mg, Oral, BID, Wouk, Ailene Rud, MD, 25 mg at 07/28/22 2136   multivitamin with minerals tablet 1 tablet, 1 tablet, Oral, Daily, Nicole Kindred A, DO, 1 tablet at  07/28/22 0915   pantoprazole (PROTONIX) EC tablet 40 mg, 40 mg, Oral, BID AC, Judd Gaudier V, MD, 40 mg at 07/28/22 1607   polyethylene glycol (MIRALAX / GLYCOLAX) packet 17 g, 17 g, Oral, Daily, Breeze, Shantelle, NP, 17 g at 07/28/22 0915   simethicone (MYLICON) 40 EX/9.3ZJ suspension 40 mg, 40 mg, Oral, QID PRN, Oswald Hillock, RPH, 40 mg at 07/28/22 6967   sodium bicarbonate tablet 1,300 mg, 1,300 mg, Oral, Daily, Judd Gaudier V, MD, 1,300 mg at 07/28/22 0915   sodium chloride flush (NS) 0.9 % injection 3 mL, 3 mL, Intravenous, Q12H, Nicole Kindred A, DO, 3 mL at 07/28/22 2137   spironolactone (ALDACTONE) tablet 12.5 mg, 12.5 mg, Oral, Daily, Lanney Gins, Surina Storts, MD, 12.5 mg at 07/28/22 1607   torsemide (DEMADEX) tablet 20 mg, 20 mg, Oral, Daily, Ottie Glazier, MD, 20 mg at 07/28/22 0915    ALLERGIES   Patient has no known allergies.     REVIEW OF SYSTEMS    Review of Systems:  Gen:  Denies  fever, sweats, chills weigh loss  HEENT: Denies blurred vision, double vision, ear pain, eye pain, hearing loss, nose bleeds, sore throat Cardiac:  No dizziness, chest pain or heaviness, chest tightness,edema Resp:   reports dyspnea chronically  Gi: Denies swallowing difficulty, stomach pain, nausea or vomiting,  diarrhea, constipation, bowel incontinence Gu:  Denies bladder incontinence, burning urine Ext:   Denies Joint pain, stiffness or swelling Skin: Denies  skin rash, easy bruising or bleeding or hives Endoc:  Denies polyuria, polydipsia , polyphagia or weight change Psych:   Denies depression, insomnia or hallucinations   Other:  All other systems negative   VS: BP 102/64 (BP Location: Right Arm)   Pulse 84   Temp 98 F (36.7 C)   Resp 17   Ht $R'5\' 6"'Dq$  (1.676 m)   Wt 47.9 kg   SpO2 96%   BMI 17.04 kg/m      PHYSICAL EXAM    GENERAL:NAD, no fevers, chills, no weakness no fatigue HEAD: Normocephalic, atraumatic.  EYES: Pupils equal, round, reactive to light. Extraocular muscles intact. No scleral icterus.  MOUTH: Moist mucosal membrane. Dentition intact. No abscess noted.  EAR, NOSE, THROAT: Clear without exudates. No external lesions.  NECK: Supple. No thyromegaly. No nodules. No JVD.  PULMONARY: decreased breath sounds with mild rhonchi worse at bases bilaterally.  CARDIOVASCULAR: S1 and S2. Regular rate and rhythm. No murmurs, rubs, or gallops. No edema. Pedal pulses 2+ bilaterally.  GASTROINTESTINAL: Soft, nontender, nondistended. No masses. Positive bowel sounds. No hepatosplenomegaly.  MUSCULOSKELETAL: No swelling, clubbing, or edema. Range of motion full in all extremities.  NEUROLOGIC: Cranial nerves II through XII are intact. No gross focal neurological deficits. Sensation intact. Reflexes intact.  SKIN: No ulceration, lesions, rashes, or cyanosis. Skin warm and dry. Turgor intact.  PSYCHIATRIC: Mood, affect within normal limits. The patient is awake, alert and oriented x 3. Insight, judgment intact.       IMAGING     ASSESSMENT/PLAN   Acute on chronic hypoxemic respiratory failure -patient with advanced COPD/CHF/CKD/lung cancer -she is with a least moderate severity physical deconditioning -all of these comorbidities are currently playing role in  dyspnea -Advanced COPD - continue ICS/LABA/LAMA with Breo Ellipta and Incruse and addition of PRN albuterol nebulizer  -CHF/CKD overlap- adding aldactone 12.5 and reducing metoprolol from 25 to 12.5 bid to allow hemodynamic leveling with midodrine and Atrial fibrillation while diuresing 3. Atelectasis - worse at RLL - Metaneb with saline  q8h 4. Right pleural effusion -  adding more diuresis, holding thoracentesis due to high likelyhood of rapid re-accumulation 5. Physical deconditioning- patient is currently with PT/OT and would benefit from same on outpatient 6. Atrial fibrillation - chronic stable - continue eliquis, metoprolol, amiodarone          Thank you for allowing me to participate in the care of this patient.   Patient/Family are satisfied with care plan and all questions have been answered.    Provider disclosure: Patient with at least one acute or chronic illness or injury that poses a threat to life or bodily function and is being managed actively during this encounter.  All of the below services have been performed independently by signing provider:  review of prior documentation from internal and or external health records.  Review of previous and current lab results.  Interview and comprehensive assessment during patient visit today. Review of current and previous chest radiographs/CT scans. Discussion of management and test interpretation with health care team and patient/family.   This document was prepared using Dragon voice recognition software and may include unintentional dictation errors.     Ottie Glazier, M.D.  Division of Pulmonary & Critical Care Medicine

## 2022-07-29 NOTE — Progress Notes (Signed)
Progress Note   Patient: Danielle [name]:[id number] DOB: 1956-06-03 DOA: 08/02/2022     11 DOS: the patient was seen and examined on 07/29/2022   Brief hospital course: NEYLA GAUNTT is a 66 y.o. female with medical history significant for Paroxysmal A-fib on chronic anticoagulation therapy, history of chronic systolic heart failure, history of adenocarcinoma of the right lung status post radiation therapy, history of stage IIIb chronic kidney disease, aortic valve stenosis, COPD with chronic respiratory failure on 4 L of oxygen continuous, history of left breast cancer status post mastectomy, history of esophageal cancer recurrent right-sided pleural effusion requiring thoracentesis, most recently on 06/19/2022 who presented to the ER on 08/05/2022 for evaluation of worsening shortness of breath with associated chest tightness.      Assessment and Plan: * Acute on chronic combined systolic and diastolic CHF (congestive heart failure) (HCC) Echo 01/30/22 -- EF 35-40% with regional WMA's, grade II diastolic dysfunction, moderate MR, mod-severe TR, moderate AS. Presented with recurrent pleural effusion and progressively worsening dyspnea. S/p thoracentesis -- initially treated with lasix --torsemide and spiro resumed by nephro but worsening renal function 10/11, diuresis held since then and gentle hydration --Pulmonology following, Recommended against repeat thoracentesis given likelihood for rapid re-accumulation --diuresis re-started 10/13 by pulm  End-of-life care Discussed with patient she is suffering from progressive failure of multiple vital organs (lungs, heart, kidneys). Discussed code status, concept of comfort care, etc. Palliative has re-engaged with the family, no final decisions made yet, remains full code   Esophageal dysmotility Pt reports difficulty with food and especially pills getting stuck in her esophagus and come back up at times, take a long time to pass into her  stomach, causes a lot of discomfort.  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 --Reviewed prior EGD results from May this year, no mention of esophageal findings. --Esophagram 10/7 showed severe esophageal dysmotility, no evidence of obstruction or stenosis --calorie count is normal, no need for PEG at the moment --Dietitian and SLP following  Prolonged QT interval QTc was 592 on EKG on admission.   Improved - repeat EKG 10/6 QTc 450 ms. --Serial EKG's to monitor --Keep K>4, Mg>2 --Telemetry monitoring  Malignant neoplasm of upper lobe of right lung Malignant neoplasm of lower third of esophagus Patient is status post radiation therapy for esophageal cancer S/p radiation therapy for lung cancer Follow-up with oncology  Recurrent right pleural effusion Presented with shortness of breath, dyspnea on exertion with chest discomfort, desaturating to the low 80s on her baseline flow rate.  Chest xray with increasing right pleural effusion from prior, BNP over 2000.   Chart reviewed: prior thoracenteses done this year on 1/30, 2/8, 4/16, 5/12, 7/25, 8/8, 9/5 --IR was consulted on admission. --Underwent thoracentesis on 10/5 with 900 cc fluid removed. --Pulmonology consulted, recommends against repeat thora.  - diuresis as above - continues to require HF Narcissa  Paroxysmal atrial fibrillation (HCC) controlled - cont metop  HLD (hyperlipidemia) On Lipitor  Abdominal pain Resolved, ct nothing acute  Anemia of chronic disease Hemoglobin at baseline  Hypotension History of same intermittently and limiting GDMT, and in setting of diuresis.   No fever or leukocytosis  Chronic obstructive pulmonary disease (HCC) Severe COPD with chronic respiratory failure on 4 L of continuous O2 at home. --Continue scheduled and PRN bronchodilators  --Continue inhaled steroids --Pulmonology following  Aki on ckd 3b Developed AKI likely due to diuresis, cardiorenal physiology. Gfr  stable here in upper 20s, slowly  worsening with diuresis Nephrology following.  Depression Continue Celexa 20 mg daily  Acute on chronic respiratory failure with hypoxia (HCC) Baseline on 4 L/min continuous O2. Today requiring more, persistent need for HF Malcolm  Carcinoma of overlapping sites of left breast in female, estrogen receptor positive ([place]) Noted. No acute issues.        Subjective: says breathing about the same today, some cough not productive, no fevers, had bm. Breathing worse when she moves, speaks a lot   Physical Exam: Vitals:   07/29/22 0430 07/29/22 0744 07/29/22 0759 07/29/22 1205  BP: 94/64  102/64 102/71  Pulse: 62  84 84  Resp:   17 17  Temp:   98 F (36.7 C) (!) 97.5 F (36.4 C)  TempSrc:      SpO2:  93% 96% 92%  Weight:      Height:       General exam: awake, alert, no acute distress HEENT: moist mucus membranes, hearing grossly normal  Respiratory system: rales at bases, normal wob, mild tachypnea Cardiovascular system: normal I5/O2,  RRR, systolic murmur noted, no peripheral edema Gastrointestinal system: abdomen is soft, non-tender, no guarding or rebound Central nervous system: A&O x3. no gross focal neurologic deficits, normal speech Extremities: moves all, no edema, normal tone Psychiatry: normal mood, congruent affect, judgement and insight appear normal   Family Communication: Daughter updated telephonically 10/15  Consults: Palliative Care Pulmonology Nephrology   Disposition: Status is: Inpatient Remains inpatient appropriate because: worsening kidney function and respiratory status   Planned Discharge Destination: Home with home health    Author: Desma Maxim, MD 07/29/2022 12:13 PM  For on call review [email or web address].

## 2022-07-29 NOTE — Progress Notes (Signed)
       CROSS COVER NOTE  NAME: RICCI PAFF MRN: [id number] DOB : 1956-04-15    Date of Service   07/29/2022   HPI/Events of Note   Notified by nursing that M(r)s Buell is reporting dyspnea and is tachypneic at rest RR 32.  No increased work of breathing and she has not had an increasing oxygen requirement. Remains on 10L via Large Bore Nasal Cannula. SPO2 95%. Poor air movement of (R) base on auscultation.  Interventions   Assessment/Plan:  Dyspnea Chest Xray-->  Suspicious for (R) basilar effusion 40 mg IV Lasix      This document was prepared using Dragon voice recognition software and may include unintentional dictation errors.  Neomia Glass DNP, MBA, FNP-BC Nurse Practitioner [facility] Pager [phone]

## 2022-07-30 ENCOUNTER — Inpatient Hospital Stay: Payer: Medicare Other

## 2022-07-30 DIAGNOSIS — I5043 Acute on chronic combined systolic (congestive) and diastolic (congestive) heart failure: Secondary | ICD-10-CM | POA: Diagnosis not present

## 2022-07-30 LAB — BASIC METABOLIC PANEL
Anion gap: 10 (ref 5–15)
BUN: 61 mg/dL — ABNORMAL HIGH (ref 8–23)
CO2: 28 mmol/L (ref 22–32)
Calcium: 9 mg/dL (ref 8.9–10.3)
Chloride: 105 mmol/L (ref 98–111)
Creatinine, Ser: 2.36 mg/dL — ABNORMAL HIGH (ref 0.44–1.00)
GFR, Estimated: 22 mL/min — ABNORMAL LOW (ref >60)
Glucose, Bld: 92 mg/dL (ref 70–99)
Potassium: 4.1 mmol/L (ref 3.5–5.1)
Sodium: 143 mmol/L (ref 135–145)

## 2022-07-30 MED ORDER — TORSEMIDE 20 MG PO TABS
40.0000 mg | ORAL_TABLET | Freq: Every day | ORAL | Status: DC
  Administered 2022-07-30 – 2022-07-31 (×2): 40 mg via ORAL
  Filled 2022-07-30 (×2): qty 2

## 2022-07-30 NOTE — Progress Notes (Signed)
[facility]  ROUNDING NOTE   Subjective:   Danielle [name] 66 year old female with past medical conditions including diabetes, hypertension, lymphedema, breast cancer COPD and chronic kidney disease.  Patient presents to the emergency department of shortness of breath and has been admitted for Pleural effusion [J90] Status post thoracentesis [Z98.890] CHF exacerbation (HCC) [I50.9] Pleural effusion on right [J90] Acute on chronic combined systolic and diastolic CHF (congestive heart failure) (HCC) [I50.43] Acute on chronic respiratory failure with hypoxia ([place]) [J96.21]  Patient is known to our practice and is followed outpatient by Dr. Holley Raring.  She was last seen in office on April 19, 2022.     Patient was seen today on second floor.Patient overnight events were reviewed Patient informed me that she had an episode of shortness of breath last night I reviewed the data patient did receive IV Lasix last night    Objective:  Vital signs in last 24 hours:  Temp:  [97.4 F (36.3 C)-98 F (36.7 C)] 97.4 F (36.3 C) (10/16 0451) Pulse Rate:  [66-84] 67 (10/16 0451) Resp:  [17-32] 18 (10/16 0451) BP: (102-115)/(64-93) 115/93 (10/16 0451) SpO2:  [92 %-100 %] 100 % (10/16 0451)  Weight change:  Filed Weights   07/25/22 0352 07/27/22 0449 07/28/22 0419  Weight: 51.1 kg 51.2 kg 47.9 kg    Intake/Output: I/O last 3 completed shifts: In: -  Out: 700 [Urine:700]   Intake/Output this shift:  Total I/O In: -  Out: 250 [Urine:250]  Physical Exam: General: Sitting side of bed  Head: Normocephalic, atraumatic. Moist oral mucosal membranes  Eyes: Anicteric  Lungs:  Mild respiratory distress,Decreased breath sounds at bases,   Heart: Irregular rate and rhythm  Abdomen:  Soft, tender, nondistended  Extremities: No peripheral edema.  Neurologic: Nonfocal, moving all four extremities  Skin: No lesions  Access: None    Basic Metabolic Panel: Recent Labs  Lab  07/24/22 0435 07/25/22 0443 07/26/22 0409 07/27/22 0710 07/28/22 0425 07/28/22 1410 07/29/22 0416  NA 141 140 141 143 141 140 142  K 4.1 4.2 4.3 4.1 4.3 4.1 4.4  CL 106 105 110 109 104 102 105  CO2 26 25 26 25 27 27 27   GLUCOSE 86 93 97 86 72 168* 101*  BUN 38* 42* 46* 47* 46* 48* 57*  CREATININE 1.96* 2.27* 2.32* 2.13* 1.98* 2.34* 2.45*  CALCIUM 9.1 8.9 8.5* 9.1 9.1 9.0 8.9  MG 1.7 2.2  --   --   --   --   --   PHOS  --   --  6.0*  --   --   --   --     Liver Function Tests: Recent Labs  Lab 07/26/22 0409  ALBUMIN 3.1*   No results for input(s): "LIPASE", "AMYLASE" in the last 168 hours. No results for input(s): "AMMONIA" in the last 168 hours.  CBC: Recent Labs  Lab 07/28/22 1410  WBC 6.6  NEUTROABS 4.7  HGB 10.4*  HCT 32.8*  MCV 97.9  PLT 230    Cardiac Enzymes: No results for input(s): "CKTOTAL", "CKMB", "CKMBINDEX", "TROPONINI" in the last 168 hours.  BNP: Invalid input(s): "POCBNP"  CBG: No results for input(s): "GLUCAP" in the last 168 hours.  Microbiology: Results for orders placed or performed during the hospital encounter of 07/27/2022  Resp Panel by RT-PCR (Flu A&B, Covid) Anterior Nasal Swab     Status: None   Collection Time: 08/05/2022  6:43 PM   Specimen: Anterior Nasal Swab  Result Value  Ref Range Status   SARS Coronavirus 2 by RT PCR NEGATIVE NEGATIVE Final    Comment: (NOTE) SARS-CoV-2 target nucleic acids are NOT DETECTED.  The SARS-CoV-2 RNA is generally detectable in upper respiratory specimens during the acute phase of infection. The lowest concentration of SARS-CoV-2 viral copies this assay can detect is 138 copies/mL. A negative result does not preclude SARS-Cov-2 infection and should not be used as the sole basis for treatment or other patient management decisions. A negative result may occur with  improper specimen collection/handling, submission of specimen other than nasopharyngeal swab, presence of viral mutation(s) within  the areas targeted by this assay, and inadequate number of viral copies(<138 copies/mL). A negative result must be combined with clinical observations, patient history, and epidemiological information. The expected result is Negative.  Fact Sheet for Patients:  EntrepreneurPulse.com.au  Fact Sheet for Healthcare Providers:  IncredibleEmployment.be  This test is no t yet approved or cleared by the [place] FDA and  has been authorized for detection and/or diagnosis of SARS-CoV-2 by FDA under an Emergency Use Authorization (EUA). This EUA will remain  in effect (meaning this test can be used) for the duration of the COVID-19 declaration under Section 564(b)(1) of the Act, 21 U.S.C.section 360bbb-3(b)(1), unless the authorization is terminated  or revoked sooner.       Influenza A by PCR NEGATIVE NEGATIVE Final   Influenza B by PCR NEGATIVE NEGATIVE Final    Comment: (NOTE) The Xpert Xpress SARS-CoV-2/FLU/RSV plus assay is intended as an aid in the diagnosis of influenza from Nasopharyngeal swab specimens and should not be used as a sole basis for treatment. Nasal washings and aspirates are unacceptable for Xpert Xpress SARS-CoV-2/FLU/RSV testing.  Fact Sheet for Patients: EntrepreneurPulse.com.au  Fact Sheet for Healthcare Providers: IncredibleEmployment.be  This test is not yet approved or cleared by the [place] FDA and has been authorized for detection and/or diagnosis of SARS-CoV-2 by FDA under an Emergency Use Authorization (EUA). This EUA will remain in effect (meaning this test can be used) for the duration of the COVID-19 declaration under Section 564(b)(1) of the Act, 21 U.S.C. section 360bbb-3(b)(1), unless the authorization is terminated or revoked.  Performed at [facility], [address] [place], [place] [address]     Coagulation Studies: No results for input(s):  "LABPROT", "INR" in the last 72 hours.  Urinalysis: No results for input(s): "COLORURINE", "LABSPEC", "PHURINE", "GLUCOSEU", "HGBUR", "BILIRUBINUR", "KETONESUR", "PROTEINUR", "UROBILINOGEN", "NITRITE", "LEUKOCYTESUR" in the last 72 hours.  Invalid input(s): "APPERANCEUR"    Imaging: DG Chest Port 1 View  Result Date: 07/29/2022 CLINICAL DATA:  Dyspnea EXAM: PORTABLE CHEST 1 VIEW COMPARISON:  07/28/2022 FINDINGS: Unchanged appearance of right hilum and parahilar masslike opacity. Mild interstitial opacity without overt edema. Cardiomediastinal contours are normal. Suspect layering right basilar effusion. IMPRESSION: 1. Unchanged appearance of right hilum and parahilar masslike opacity. 2. Suspect layering right basilar effusion. Electronically Signed   By: Ulyses Jarred M.D.   On: 07/29/2022 21:07   DG Chest Port 1 View  Result Date: 07/28/2022 CLINICAL DATA:  Shortness of breath, COPD EXAM: PORTABLE CHEST 1 VIEW COMPARISON:  07/26/2022 FINDINGS: Unchanged AP portable examination. Cardiomegaly with a small right pleural effusion and perihilar masslike consolidation. Mild, diffuse bilateral interstitial pulmonary opacity. Osseous structures unremarkable. IMPRESSION: 1. Unchanged AP portable examination. Cardiomegaly with a small right pleural effusion and perihilar masslike consolidation. 2. Mild, diffuse bilateral interstitial pulmonary opacity, likely edema. No new or focal airspace opacity. Electronically Signed   By: Cristie Hem  Cherly Beach M.D.   On: 07/28/2022 12:22     Medications:      amiodarone  200 mg Oral Daily   apixaban  2.5 mg Oral BID   atorvastatin  40 mg Oral Daily   citalopram  20 mg Oral Daily   feeding supplement  1 Container Oral TID BM   metoprolol tartrate  12.5 mg Oral BID   multivitamin with minerals  1 tablet Oral Daily   pantoprazole  40 mg Oral BID AC   polyethylene glycol  17 g Oral Daily   sodium bicarbonate  1,300 mg Oral Daily   sodium chloride flush  3 mL  Intravenous Q12H   spironolactone  12.5 mg Oral Daily   torsemide  40 mg Oral Daily   acetaminophen **OR** acetaminophen, calcium carbonate, guaiFENesin, HYDROcodone-acetaminophen, melatonin, simethicone  Assessment/ Plan:  Danielle [name] is a 66 y.o.  female with past medical conditions including diabetes, hypertension, lymphedema, breast cancer COPD and chronic kidney disease.  Patient presents to the emergency department of shortness of breath and has been admitted for Pleural effusion [J90] Status post thoracentesis [Z98.890] CHF exacerbation ([place]) [I50.9] Pleural effusion on right [J90] Acute on chronic combined systolic and diastolic CHF (congestive heart failure) (HCC) [I50.43] Acute on chronic respiratory failure with hypoxia (HCC) [J96.21]   Acute Kidney Injury on chronic kidney disease stage IIIb with baseline creatinine 1.50 and GFR of 38 on 06/16/22.  Acute kidney injury secondary to cardiorenal syndrome and diuretic therapy Chronic kidney disease is secondary to hypertension and CHF.  No IV contrast exposure.   Patient peak creatinine was at 2.7 on October 6 thereafter . Patient creatinine has worsened recently from 2.0 to now 2.5. Educated patient about the worsening of her kidney disease.  We will continue patient on diuretics  Lab Results  Component Value Date   CREATININE 2.45 (H) 07/29/2022   CREATININE 2.34 (H) 07/28/2022   CREATININE 1.98 (H) 07/28/2022    Intake/Output Summary (Last 24 hours) at 07/30/2022 0656 Last data filed at 07/29/2022 2334 Gross per 24 hour  Intake --  Output 750 ml  Net -750 ml    2. Anemia of chronic kidney disease Lab Results  Component Value Date   HGB 10.4 (L) 07/28/2022    Hemoglobin at goal  3.  Hypertension with chronic kidney disease.  Home regimen includes torsemide, amiodarone, hydralazine, and isosorbide. Continue Midodrine 5mg  three times daily for BP support.   Patient is currently on torsemide  4.  Pleural  effusions, recurrent.   Patient is being followed by pulmonary   5.Acute on chronic Combined systolic and diastolic CHF Patient had increased shortness of breath last night We will increase the dose of torsemide  6.Social Patient has poor prognosis because of multiorgan disease process.  Patient is being followed by primary team and palliative care team has been consulted as well   LOS: 12 Dmario Russom s Lucianna Ostlund 10/16/20236:56 AM

## 2022-07-30 NOTE — Progress Notes (Signed)
PULMONOLOGY         Date: 07/30/2022,   MRN# [id number] Danielle [name] 09-07-1956     AdmissionWeight: 50.3 kg                 CurrentWeight: 47.9 kg  Referring provider: Dr Arbutus Ped   CHIEF COMPLAINT:   Recurrent pleural effusions and advanced metastatic cancer.    HISTORY OF PRESENT ILLNESS   This is a very pleasant 66yo F with hx of advanced COPD, chronic anemia, paroxysmal AFchronic anticoagulation therapy, history of chronic systolic heart failure, history of adenocarcinoma of the right lung status post radiation therapy, history of stage IIIb chronic kidney disease, aortic valve stenosis, COPD with chronic respiratory failure on 4 L of oxygen continuous, history of left breast cancer status post mastectomy, history of esophageal cancer recurrent right-sided pleural effusion requiring thoracentesis Most recently on 06/19/2022 who presents to the ER for evaluation of worsening shortness of breath from her baseline.  She has associated chest tightness. She denies having mucus on expectoration, she is on 3-4L/min during my evaluation and notes when attempting to ambulate she has dyspnea.   07/24/22- patient is mildly imprved with bronchopulmonary hygiene .  Continue current care and may initiate DC planning.   07/25/22- no acute events overnight continue current care plan.  She is close to baseline.   07/26/22- patient reports slight worsening and is now on 7L/min.    07/27/22-  patient still not improved with cxr showing ongoing edema and effusions. I have stopped her metoprolol and midodrine due to drug/drug interaction and have ordered torsemide to diurese her despite CKD.  She is overall chronically ill with severe comorbid conditions. She has palliative care consultation ordered but remains full code which seems too aggressive for her current state of health.   07/28/22- patient is on 13L/min this am which is improved from yesterday.  She is on torsemide now and seems  to be putting out more fluid.  She started to expectorate some mucus this am. Will order aldactone 12.5 in additon to torsemide.  07/29/22- patient mildly improved will continue diuresis wit fluid restricted diet  07/30/22- despite more aggressive diuresis patient has not improved and continues to require increased O2 via [place].  CXR with small amount of edema.  There is masslike consolidation and layering effusion.  CKD is no better and she may need thoracentesis or dialysis for fluid management.   PAST MEDICAL HISTORY   Past Medical History:  Diagnosis Date   Acute respiratory failure due to COVID-19 ([place]) 09/27/2019   Anemia    Anxiety    Aortic atherosclerosis ([place])    Aortic valve stenosis 02/10/2018   a.) TTE 02/10/2018: EF 55-60%: mild AS with MPG of 12 mmHg. b.) TTE 04/21/2018: EF 35-40%; mild AS with MPG 8 mmHg. c.) TTE 11/07/2019: EF 50-55%; mild AS with MPG 10 mmHg.   Arthritis    Atrial fibrillation (HCC)    a.) CHA2DS2-VASc = 4 (age, sex, HTN, aortic plaque). b.) Rate/rhythm maintained on oral carvedilol; chronically anticoagulated on dose reduced apixaban. c.)  Attempted deployment of LAA occlusive device on 01/11/2021; parameters failed and procedure aborted.   Breast cancer, left ([place]) 2000   a.) T2N1M0; ER/PR (+) --> Tx'd with total mastectomy, LN resection, XRT, and chemotherapy   Cancer of right lung ([place]) 07/30/2016   a.) adenocarcinoma; ALK, ROS1, PDL1, BRAF, EGFR all negative.   CHF (congestive heart failure) (HCC)    Chronic diastolic CHF (congestive  heart failure) ([place]) 11/26/2021   CKD (chronic kidney disease), stage IV (HCC)    COPD (chronic obstructive pulmonary disease) (HCC)    Dependence on supplemental oxygen    Depression    Diastolic dysfunction 47/42/5956   a.) TTE 02/10/2017: EF 55-60%; G2DD. b.) TTE 04/21/2018: EF 35-40%; mild LA dilation; mod MV regurgitation. c.) TTE 11/07/2019: EF 50-55%; G1DD.   DOE (dyspnea on exertion)    GERD (gastroesophageal  reflux disease)    Heart murmur    History of 2019 novel coronavirus disease (COVID-19) 10/14/2019   HLD (hyperlipidemia)    Hypertension    Long term current use of anticoagulant    a.) apixaban   Lymphedema    Personal history of chemotherapy    Personal history of radiation therapy    Respiratory tract infection due to COVID-19 virus 09/27/2019   SBO (small bowel obstruction) ([place]) 11/05/2020   Vitamin D deficiency      SURGICAL HISTORY   Past Surgical History:  Procedure Laterality Date   Breast Biospy Left    [facility]   BREAST SURGERY     CARDIOVERSION N/A 02/23/2022   Procedure: CARDIOVERSION;  Surgeon: Corey Skains, MD;  Location: [facility] ORS;  Service: Cardiovascular;  Laterality: N/A;   COLONOSCOPY N/A 04/30/2018   Procedure: COLONOSCOPY;  Surgeon: Virgel Manifold, MD;  Location: [facility] ENDOSCOPY;  Service: Endoscopy;  Laterality: N/A;   COLONOSCOPY N/A 07/22/2018   Procedure: COLONOSCOPY;  Surgeon: Virgel Manifold, MD;  Location: [facility] ENDOSCOPY;  Service: Endoscopy;  Laterality: N/A;   COLONOSCOPY WITH PROPOFOL N/A 09/21/2021   Procedure: COLONOSCOPY WITH PROPOFOL;  Surgeon: Benjamine Sprague, DO;  Location: [facility] ENDOSCOPY;  Service: General;  Laterality: N/A;   COLONOSCOPY WITH PROPOFOL N/A 03/11/2022   Procedure: COLONOSCOPY WITH PROPOFOL;  Surgeon: Annamaria Helling, DO;  Location: [facility] ENDOSCOPY;  Service: Gastroenterology;  Laterality: N/A;   DILATION AND CURETTAGE OF UTERUS     ELECTROMAGNETIC NAVIGATION BROCHOSCOPY Right 04/11/2016   Procedure: ELECTROMAGNETIC NAVIGATION BRONCHOSCOPY;  Surgeon: Vilinda Boehringer, MD;  Location: [facility] ORS;  Service: Cardiopulmonary;  Laterality: Right;   ESOPHAGOGASTRODUODENOSCOPY N/A 07/22/2018   Procedure: ESOPHAGOGASTRODUODENOSCOPY (EGD);  Surgeon: Virgel Manifold, MD;  Location: [facility] ENDOSCOPY;  Service: Endoscopy;  Laterality: N/A;   ESOPHAGOGASTRODUODENOSCOPY (EGD) WITH PROPOFOL N/A 05/07/2018   Procedure:  ESOPHAGOGASTRODUODENOSCOPY (EGD) WITH PROPOFOL;  Surgeon: Lucilla Lame, MD;  Location: [facility] ENDOSCOPY;  Service: Endoscopy;  Laterality: N/A;   ESOPHAGOGASTRODUODENOSCOPY (EGD) WITH PROPOFOL N/A 04/24/2019   Procedure: ESOPHAGOGASTRODUODENOSCOPY (EGD) WITH PROPOFOL;  Surgeon: Jonathon Bellows, MD;  Location: [facility] ENDOSCOPY;  Service: Gastroenterology;  Laterality: N/A;   ESOPHAGOGASTRODUODENOSCOPY (EGD) WITH PROPOFOL N/A 01/12/2020   Procedure: ESOPHAGOGASTRODUODENOSCOPY (EGD) WITH PROPOFOL;  Surgeon: Jonathon Bellows, MD;  Location: [facility] ENDOSCOPY;  Service: Gastroenterology;  Laterality: N/A;   ESOPHAGOGASTRODUODENOSCOPY (EGD) WITH PROPOFOL N/A 04/28/2020   Procedure: ESOPHAGOGASTRODUODENOSCOPY (EGD) WITH PROPOFOL;  Surgeon: Jonathon Bellows, MD;  Location: [facility] ENDOSCOPY;  Service: Gastroenterology;  Laterality: N/A;   ESOPHAGOGASTRODUODENOSCOPY (EGD) WITH PROPOFOL N/A 03/11/2022   Procedure: ESOPHAGOGASTRODUODENOSCOPY (EGD) WITH PROPOFOL;  Surgeon: Annamaria Helling, DO;  Location: [facility] ENDOSCOPY;  Service: Gastroenterology;  Laterality: N/A;   EUS N/A 05/07/2019   Procedure: FULL UPPER ENDOSCOPIC ULTRASOUND (EUS) RADIAL;  Surgeon: Jola Schmidt, MD;  Location: [facility] ENDOSCOPY;  Service: Endoscopy;  Laterality: N/A;   ILEOSCOPY N/A 07/22/2018   Procedure: ILEOSCOPY THROUGH STOMA;  Surgeon: Virgel Manifold, MD;  Location: [facility] ENDOSCOPY;  Service: Endoscopy;  Laterality: N/A;   ILEOSTOMY     ILEOSTOMY  N/A 09/08/2018   Procedure: ILEOSTOMY REVISION POSSIBLE CREATION;  Surgeon: Herbert Pun, MD;  Location: [facility] ORS;  Service: General;  Laterality: N/A;   ILEOSTOMY CLOSURE N/A 08/15/2018   Procedure: DILATION OF ILEOSTOMY STRICTURE;  Surgeon: Herbert Pun, MD;  Location: [facility] ORS;  Service: General;  Laterality: N/A;   LAPAROTOMY Right 05/04/2018   Procedure: EXPLORATORY LAPAROTOMY right colectomy right and left ostomy;  Surgeon: Herbert Pun, MD;  Location: [facility] ORS;  Service:  General;  Laterality: Right;   LEFT ATRIAL APPENDAGE OCCLUSION N/A 01/11/2021   Procedure: LEFT ATRIAL APPENDAGE OCCLUSION ([place]); ABORTED PROCEDURE WITHOUT DEVICE BEING IMPLANTED; Location: [facility]; Surgeon: Mylinda Latina, MD   LUNG BIOPSY     MASTECTOMY Left    2000, [place] Right    [place]   XI ROBOTIC ASSISTED COLOSTOMY TAKEDOWN N/A 10/23/2021   Procedure: XI ROBOTIC ASSISTED ILEOSTOMY TAKEDOWN;  Surgeon: Herbert Pun, MD;  Location: [facility] ORS;  Service: General;  Laterality: N/A;  180 minutes for the surgery part please     FAMILY HISTORY   Family History  Problem Relation Age of Onset   Breast cancer Mother 76   Cancer Mother        Breast    Cirrhosis Father    Breast cancer Paternal Aunt 46   Cancer Maternal Aunt        Breast      SOCIAL HISTORY   Social History   Tobacco Use   Smoking status: Former    Packs/day: 0.50    Years: 20.00    Total pack years: 10.00    Types: Cigarettes    Quit date: 07/02/2012    Years since quitting: 10.0   Smokeless tobacco: Current    Types: Snuff  Vaping Use   Vaping Use: Never used  Substance Use Topics   Alcohol use: Yes    Alcohol/week: 3.0 standard drinks of alcohol    Types: 3 Cans of beer per week    Comment: Occasionally beer   Drug use: No     MEDICATIONS    Home Medication:    Current Medication:  Current Facility-Administered Medications:    acetaminophen (TYLENOL) tablet 650 mg, 650 mg, Oral, Q6H PRN, 650 mg at 07/29/22 1014 **OR** acetaminophen (TYLENOL) suppository 650 mg, 650 mg, Rectal, Q6H PRN, Athena Masse, MD   amiodarone (PACERONE) tablet 200 mg, 200 mg, Oral, Daily, Judd Gaudier V, MD, 200 mg at 07/30/22 0930   apixaban (ELIQUIS) tablet 2.5 mg, 2.5 mg, Oral, BID, Judd Gaudier V, MD, 2.5 mg at 07/30/22 0931   atorvastatin (LIPITOR) tablet 40 mg, 40 mg, Oral, Daily, Judd Gaudier V, MD, 40 mg at 07/30/22 0930   calcium carbonate (TUMS - dosed in mg elemental  calcium) chewable tablet 200 mg of elemental calcium, 1 tablet, Oral, TID PRN, Nicole Kindred A, DO, 200 mg of elemental calcium at 07/24/22 2203   citalopram (CELEXA) tablet 20 mg, 20 mg, Oral, Daily, Nicole Kindred A, DO, 20 mg at 07/30/22 0931   feeding supplement (BOOST / RESOURCE BREEZE) liquid 1 Container, 1 Container, Oral, TID BM, Nicole Kindred A, DO, 1 Container at 07/29/22 2136   guaiFENesin (MUCINEX) 12 hr tablet 1,200 mg, 1,200 mg, Oral, BID PRN, Gwynne Edinger, MD, 1,200 mg at 07/30/22 1047   HYDROcodone-acetaminophen (NORCO/VICODIN) 5-325 MG per tablet 1-2 tablet, 1-2 tablet, Oral, Q4H PRN, Athena Masse, MD, 1 tablet at 07/30/22 0033   melatonin tablet 5 mg, 5 mg, Oral,  QHS PRN, Sharion Settler, NP, 5 mg at 07/29/22 2135   metoprolol tartrate (LOPRESSOR) tablet 12.5 mg, 12.5 mg, Oral, BID, Lanney Gins, , MD, 12.5 mg at 07/30/22 0931   multivitamin with minerals tablet 1 tablet, 1 tablet, Oral, Daily, Nicole Kindred A, DO, 1 tablet at 07/30/22 0931   pantoprazole (PROTONIX) EC tablet 40 mg, 40 mg, Oral, BID AC, Judd Gaudier V, MD, 40 mg at 07/30/22 0930   polyethylene glycol (MIRALAX / GLYCOLAX) packet 17 g, 17 g, Oral, Daily, Breeze, Shantelle, NP, 17 g at 07/30/22 0931   simethicone (MYLICON) 40 LF/8.1OF suspension 40 mg, 40 mg, Oral, QID PRN, Oswald Hillock, RPH, 40 mg at 07/30/22 0932   sodium bicarbonate tablet 1,300 mg, 1,300 mg, Oral, Daily, Judd Gaudier V, MD, 1,300 mg at 07/30/22 0931   sodium chloride flush (NS) 0.9 % injection 3 mL, 3 mL, Intravenous, Q12H, Nicole Kindred A, DO, 3 mL at 07/29/22 2136   spironolactone (ALDACTONE) tablet 12.5 mg, 12.5 mg, Oral, Daily, Lanney Gins, , MD, 12.5 mg at 07/30/22 0931   torsemide (DEMADEX) tablet 40 mg, 40 mg, Oral, Daily, Bhutani, Manpreet S, MD, 40 mg at 07/30/22 0930    ALLERGIES   Patient has no known allergies.     REVIEW OF SYSTEMS    Review of Systems:  Gen:  Denies  fever, sweats, chills weigh  loss  HEENT: Denies blurred vision, double vision, ear pain, eye pain, hearing loss, nose bleeds, sore throat Cardiac:  No dizziness, chest pain or heaviness, chest tightness,edema Resp:   reports dyspnea chronically  Gi: Denies swallowing difficulty, stomach pain, nausea or vomiting, diarrhea, constipation, bowel incontinence Gu:  Denies bladder incontinence, burning urine Ext:   Denies Joint pain, stiffness or swelling Skin: Denies  skin rash, easy bruising or bleeding or hives Endoc:  Denies polyuria, polydipsia , polyphagia or weight change Psych:   Denies depression, insomnia or hallucinations   Other:  All other systems negative   VS: BP 116/87 (BP Location: Right Arm)   Pulse 91   Temp 97.7 F (36.5 C)   Resp 18   Ht 5' 6" (1.676 m)   Wt 47.9 kg   SpO2 96%   BMI 17.04 kg/m      PHYSICAL EXAM    GENERAL:NAD, no fevers, chills, no weakness no fatigue HEAD: Normocephalic, atraumatic.  EYES: Pupils equal, round, reactive to light. Extraocular muscles intact. No scleral icterus.  MOUTH: Moist mucosal membrane. Dentition intact. No abscess noted.  EAR, NOSE, THROAT: Clear without exudates. No external lesions.  NECK: Supple. No thyromegaly. No nodules. No JVD.  PULMONARY: decreased breath sounds with mild rhonchi worse at bases bilaterally.  CARDIOVASCULAR: S1 and S2. Regular rate and rhythm. No murmurs, rubs, or gallops. No edema. Pedal pulses 2+ bilaterally.  GASTROINTESTINAL: Soft, nontender, nondistended. No masses. Positive bowel sounds. No hepatosplenomegaly.  MUSCULOSKELETAL: No swelling, clubbing, or edema. Range of motion full in all extremities.  NEUROLOGIC: Cranial nerves II through XII are intact. No gross focal neurological deficits. Sensation intact. Reflexes intact.  SKIN: No ulceration, lesions, rashes, or cyanosis. Skin warm and dry. Turgor intact.  PSYCHIATRIC: Mood, affect within normal limits. The patient is awake, alert and oriented x 3. Insight,  judgment intact.       IMAGING     ASSESSMENT/PLAN   Acute on chronic hypoxemic respiratory failure -patient with advanced COPD/CHF/CKD/lung cancer -she is with a least moderate severity physical deconditioning -all of these comorbidities are currently playing role in dyspnea -Advanced  COPD - continue ICS/LABA/LAMA with Breo Ellipta and Incruse and addition of PRN albuterol nebulizer  -CHF/CKD overlap- adding aldactone 12.5 and reducing metoprolol from 25 to 12.5 bid to allow hemodynamic leveling with midodrine and Atrial fibrillation while diuresing 3. Atelectasis - worse at RLL - Metaneb with saline q8h 4. Right pleural effusion -  adding more diuresis, holding thoracentesis due to high likelyhood of rapid re-accumulation 5. Physical deconditioning- patient is currently with PT/OT and would benefit from same on outpatient 6. Atrial fibrillation - chronic stable - continue eliquis, metoprolol, amiodarone          Thank you for allowing me to participate in the care of this patient.   Patient/Family are satisfied with care plan and all questions have been answered.    Provider disclosure: Patient with at least one acute or chronic illness or injury that poses a threat to life or bodily function and is being managed actively during this encounter.  All of the below services have been performed independently by signing provider:  review of prior documentation from internal and or external health records.  Review of previous and current lab results.  Interview and comprehensive assessment during patient visit today. Review of current and previous chest radiographs/CT scans. Discussion of management and test interpretation with health care team and patient/family.   This document was prepared using Dragon voice recognition software and may include unintentional dictation errors.     Ottie Glazier, M.D.  Division of Pulmonary & Critical Care Medicine

## 2022-07-30 NOTE — Progress Notes (Signed)
Occupational Therapy Treatment Patient Details Name: Danielle [name] MRN: [id number] DOB: [birth year] Today's Date: 07/30/2022   History of present illness Pt is a 66 year old female presented to the ER on 08/06/2022 for evaluation of worsening shortness of breath with associated chest tightness; PMH significant for Paroxysmal A-fib on chronic anticoagulation therapy, history of chronic systolic heart failure, history of adenocarcinoma of the right lung status post radiation therapy, history of stage IIIb chronic kidney disease, aortic valve stenosis, COPD with chronic respiratory failure on 4 L of oxygen continuous, history of left breast cancer status post mastectomy, history of esophageal cancer recurrent right-sided pleural effusion requiring thoracentesis   OT comments  Pt seen for brief OT tx. Pt seated on BSC upon OT's arrival, ready for assist. Pt endorses completing pericare in sitting prior to OT's arrival. HHA to stand and complete clothing mgt with no LOB noted. Pt completed step pivot transfer with handheld assist. HR in 120's with exertion, back down to 90's-100's upon return to bed. Pt denied worsening SOB. Pt continues to benefit from skilled OT services to maximize independence with ADL/mobility.    Recommendations for follow up therapy are one component of a multi-disciplinary discharge planning process, led by the attending physician.  Recommendations may be updated based on patient status, additional functional criteria and insurance authorization.    Follow Up Recommendations  Home health OT    Assistance Recommended at Discharge Intermittent Supervision/Assistance  Patient can return home with the following  A little help with walking and/or transfers;A little help with bathing/dressing/bathroom   Equipment Recommendations  Tub/shower seat    Recommendations for Other Services      Precautions / Restrictions Precautions Precautions: Fall Restrictions Weight Bearing  Restrictions: No       Mobility Bed Mobility Overal bed mobility: Needs Assistance Bed Mobility: Sit to Supine       Sit to supine: Min assist   General bed mobility comments: MIN A for BLE back to bed    Transfers Overall transfer level: Needs assistance Equipment used: 1 person hand held assist Transfers: Sit to/from Stand, Bed to chair/wheelchair/BSC Sit to Stand: Supervision     Step pivot transfers: Min guard           Balance Overall balance assessment: Needs assistance Sitting-balance support: Feet supported Sitting balance-Leahy Scale: Good     Standing balance support: During functional activity, Bilateral upper extremity supported, Single extremity supported, No upper extremity supported Standing balance-Leahy Scale: Fair                             ADL either performed or assessed with clinical judgement   ADL Overall ADL's : Needs assistance/impaired                         Toilet Transfer: Min Dispensing optician Details (indicate cue type and reason): handheld assist for step pivot from BSC to EOB (pt declined RW) Toileting- Water quality scientist and Hygiene: Min guard;Sit to/from stand Toileting - Clothing Manipulation Details (indicate cue type and reason): Pt completed pericare in sitting prior to OT arrival, CGA in standing to complete clothing mgt without UE Support and no overt LOB            Extremity/Trunk Assessment              Vision       Perception     Praxis  Cognition Arousal/Alertness: Awake/alert Behavior During Therapy: WFL for tasks assessed/performed Overall Cognitive Status: Within Functional Limits for tasks assessed                                          Exercises      Shoulder Instructions       General Comments HR in 120's with toileting task, back down to 90's-100's once returned to bed, pt denied SOB worse than "normal"    Pertinent  Vitals/ Pain       Pain Assessment Pain Assessment: No/denies pain  Home Living                                          Prior Functioning/Environment              Frequency  Min 2X/week        Progress Toward Goals  OT Goals(current goals can now be found in the care plan section)  Progress towards OT goals: Progressing toward goals  Acute Rehab OT Goals Patient Stated Goal: go home OT Goal Formulation: With patient Time For Goal Achievement: 08/05/22 Potential to Achieve Goals: Good  Plan Discharge plan remains appropriate;Frequency remains appropriate    Co-evaluation                 AM-PAC OT "6 Clicks" Daily Activity     Outcome Measure   Help from another person eating meals?: None Help from another person taking care of personal grooming?: None Help from another person toileting, which includes using toliet, bedpan, or urinal?: A Little Help from another person bathing (including washing, rinsing, drying)?: A Little Help from another person to put on and taking off regular upper body clothing?: None Help from another person to put on and taking off regular lower body clothing?: None 6 Click Score: 22    End of Session Equipment Utilized During Treatment: Oxygen  OT Visit Diagnosis: Unsteadiness on feet (R26.81)   Activity Tolerance Patient tolerated treatment well   Patient Left in bed;with call bell/phone within reach;with bed alarm set   Nurse Communication          Time: 2694-8546 OT Time Calculation (min): 8 min  Charges: OT General Charges $OT Visit: 1 Visit OT Treatments $Self Care/Home Management : 8-22 mins  Ardeth Perfect., MPH, MS, OTR/L ascom [phone] 07/30/22, 12:29 PM

## 2022-07-30 NOTE — Progress Notes (Signed)
Progress Note   Patient: Danielle [name] IRS:[id number] DOB: Nov 16, 1955 DOA: 08/12/2022     12 DOS: the patient was seen and examined on 07/30/2022   Brief hospital course: Danielle [name] is a 66 y.o. female with medical history significant for Paroxysmal A-fib on chronic anticoagulation therapy, history of chronic systolic heart failure, history of adenocarcinoma of the right lung status post radiation therapy, history of stage IIIb chronic kidney disease, aortic valve stenosis, COPD with chronic respiratory failure on 4 L of oxygen continuous, history of left breast cancer status post mastectomy, history of esophageal cancer recurrent right-sided pleural effusion requiring thoracentesis, most recently on 06/19/2022 who presented to the ER on 07/22/2022 for evaluation of worsening shortness of breath with associated chest tightness.      Assessment and Plan: * Acute on chronic combined systolic and diastolic CHF (congestive heart failure) (HCC) Echo 01/30/22 -- EF 35-40% with regional WMA's, grade II diastolic dysfunction, moderate MR, mod-severe TR, moderate AS. Presented with recurrent pleural effusion and progressively worsening dyspnea. S/p thoracentesis -- initially treated with lasix --torsemide and spiro resumed by nephro but worsening renal function 10/11, diuresis held since then and gentle hydration --Pulmonology following, Recommended against repeat thoracentesis if possible given likelihood for rapid re-accumulation --diuresis re-started 10/13 by pulm   Acute on chronic respiratory failure with hypoxia (HCC) Baseline on 4 L/min continuous O2. Here requiring high flow, worsening o2 requirement overnight - cxr pending, per pulm may need dialysis for fluid mgmt  End-of-life care Discussed with patient she is suffering from progressive failure of multiple vital organs (lungs, heart, kidneys). Discussed code status, concept of comfort care, etc. Palliative has re-engaged with the family,  no final decisions made yet, remains full code   Esophageal dysmotility Pt reports difficulty with food and especially pills getting stuck in her esophagus and come back up at times, take a long time to pass into her stomach, causes a lot of discomfort.  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 --Reviewed prior EGD results from May this year, no mention of esophageal findings. --Esophagram 10/7 showed severe esophageal dysmotility, no evidence of obstruction or stenosis --calorie count is normal, no need for PEG at the moment --Dietitian and SLP following  Prolonged QT interval QTc was 592 on EKG on admission.   Improved - repeat EKG 10/6 QTc 450 ms. --Serial EKG's to monitor --Keep K>4, Mg>2 --Telemetry monitoring  Malignant neoplasm of upper lobe of right lung Malignant neoplasm of lower third of esophagus Patient is status post radiation therapy for esophageal cancer S/p radiation therapy for lung cancer Follow-up with oncology  Recurrent right pleural effusion Presented with shortness of breath, dyspnea on exertion with chest discomfort, desaturating to the low 80s on her baseline flow rate.  Chest xray with increasing right pleural effusion from prior, BNP over 2000.   Chart reviewed: prior thoracenteses done this year on 1/30, 2/8, 4/16, 5/12, 7/25, 8/8, 9/5 --IR was consulted on admission. --Underwent thoracentesis on 10/5 with 900 cc fluid removed. --Pulmonology consulted, recommends against repeat thora if possible  - diuresis as above - repeat cxr today, patient does want repeat thora if sig fluid collection encountered - continues to require HF [place]  Paroxysmal atrial fibrillation (HCC) controlled - cont metop  HLD (hyperlipidemia) On Lipitor  Abdominal pain Resolved, ct nothing acute  Anemia of chronic disease Hemoglobin at baseline  Hypotension History of same intermittently and limiting GDMT, and in setting of diuresis.   No fever or  leukocytosis  Chronic obstructive pulmonary disease (HCC) Severe COPD with chronic respiratory failure on 4 L of continuous O2 at home. --Continue scheduled and PRN bronchodilators  --Continue inhaled steroids --Pulmonology following  Aki on ckd 3b Developed AKI likely due to diuresis, cardiorenal physiology. Gfr stable here in upper 20s, slowly worsening with diuresis Nephrology following.  Depression Continue Celexa 20 mg daily  Carcinoma of overlapping sites of left breast in female, estrogen receptor positive ([place]) Noted. No acute issues.        Subjective: breathing a bit worse, particularly when moves   Physical Exam: Vitals:   07/29/22 2332 07/30/22 0224 07/30/22 0451 07/30/22 0806  BP: 110/81  (!) 115/93 116/87  Pulse: 79  67 91  Resp: 20  18 18   Temp: 97.8 F (36.6 C)  (!) 97.4 F (36.3 C) 97.7 F (36.5 C)  TempSrc:      SpO2: 95% 99% 100% 96%  Weight:      Height:       General exam: awake, alert, no acute distress HEENT: moist mucus membranes, hearing grossly normal  Respiratory system: rales at bases, normal wob, mild tachypnea, decreased sounds right base Cardiovascular system: normal G8/Z6,  RRR, systolic murmur noted, no peripheral edema Gastrointestinal system: abdomen is soft, non-tender, no guarding or rebound Central nervous system: A&O x3. no gross focal neurologic deficits, normal speech Extremities: moves all, no edema, normal tone Psychiatry: normal mood, congruent affect, judgement and insight appear normal   Family Communication: Daughter updated telephonically 10/15  Consults: Palliative Care Pulmonology Nephrology   Disposition: Status is: Inpatient Remains inpatient appropriate because: worsening kidney function and respiratory status   Planned Discharge Destination: Home with home health    Author: Desma Maxim, MD 07/30/2022 11:24 AM  For on call review [email or web address].

## 2022-07-30 NOTE — Care Management Important Message (Signed)
Important Message  Patient Details  Name: ZAFIRA MUNOS MRN: [id number] Date of Birth: 12-31-1955   Medicare Important Message Given:  Yes     Dannette Maela 07/30/2022, 12:51 PM

## 2022-07-31 DIAGNOSIS — I5043 Acute on chronic combined systolic (congestive) and diastolic (congestive) heart failure: Secondary | ICD-10-CM | POA: Diagnosis not present

## 2022-07-31 DIAGNOSIS — Z7189 Other specified counseling: Secondary | ICD-10-CM | POA: Diagnosis not present

## 2022-07-31 LAB — BASIC METABOLIC PANEL
Anion gap: 11 (ref 5–15)
BUN: 63 mg/dL — ABNORMAL HIGH (ref 8–23)
CO2: 27 mmol/L (ref 22–32)
Calcium: 9.1 mg/dL (ref 8.9–10.3)
Chloride: 103 mmol/L (ref 98–111)
Creatinine, Ser: 2.48 mg/dL — ABNORMAL HIGH (ref 0.44–1.00)
GFR, Estimated: 21 mL/min — ABNORMAL LOW (ref >60)
Glucose, Bld: 119 mg/dL — ABNORMAL HIGH (ref 70–99)
Potassium: 3.9 mmol/L (ref 3.5–5.1)
Sodium: 141 mmol/L (ref 135–145)

## 2022-07-31 LAB — CBC
HCT: 29.5 % — ABNORMAL LOW (ref 36.0–46.0)
Hemoglobin: 9.4 g/dL — ABNORMAL LOW (ref 12.0–15.0)
MCH: 31 pg (ref 26.0–34.0)
MCHC: 31.9 g/dL (ref 30.0–36.0)
MCV: 97.4 fL (ref 80.0–100.0)
Platelets: 223 10*3/uL (ref 150–400)
RBC: 3.03 MIL/uL — ABNORMAL LOW (ref 3.87–5.11)
RDW: 15.8 % — ABNORMAL HIGH (ref 11.5–15.5)
WBC: 5.5 10*3/uL (ref 4.0–10.5)
nRBC: 0 % (ref 0.0–0.2)

## 2022-07-31 NOTE — Progress Notes (Signed)
Progress Note   Patient: Danielle [name] DOB: [birth year] DOA: 07/16/2022     13 DOS: the patient was seen and examined on 07/31/2022   Brief hospital course: Danielle [name] is a 66 y.o. female with medical history significant for Paroxysmal A-fib on chronic anticoagulation therapy, history of chronic systolic heart failure, history of adenocarcinoma of the right lung status post radiation therapy, history of stage IIIb chronic kidney disease, aortic valve stenosis, COPD with chronic respiratory failure on 4 L of oxygen continuous, history of left breast cancer status post mastectomy, history of esophageal cancer recurrent right-sided pleural effusion requiring thoracentesis, most recently on 06/19/2022 who presented to the ER on 07/21/2022 for evaluation of worsening shortness of breath with associated chest tightness.      Assessment and Plan: * Acute on chronic combined systolic and diastolic CHF (congestive heart failure) (HCC) Echo 01/30/22 -- EF 35-40% with regional WMA's, grade II diastolic dysfunction, moderate MR, mod-severe TR, moderate AS. Presented with recurrent pleural effusion and progressively worsening dyspnea. S/p thoracentesis -- initially treated with lasix --torsemide and spiro resumed by nephro but worsening renal function 10/11, diuresis held since then and gentle hydration --Pulmonology following, Recommended against repeat thoracentesis if possible given likelihood for rapid re-accumulation --diuresis re-started 10/13 by pulm - respiratory status steadily declining, pulmonology advises comfort care. Patient is contemplating  Acute on chronic respiratory failure with hypoxia (HCC) Baseline on 4 L/min continuous O2. Here requiring high flow, worsening o2 requirement overnight - cxr pending, per pulm may need dialysis for fluid mgmt but likely poor candidate for this  End-of-life care Discussed with patient she is suffering from progressive failure of multiple  vital organs (lungs, heart, kidneys). Discussed code status, concept of comfort care, etc. Palliative has re-engaged with the family, no final decisions made yet, remains full code   Esophageal dysmotility Pt reports difficulty with food and especially pills getting stuck in her esophagus and come back up at times, take a long time to pass into her stomach, causes a lot of discomfort.  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 --Reviewed prior EGD results from May this year, no mention of esophageal findings. --Esophagram 10/7 showed severe esophageal dysmotility, no evidence of obstruction or stenosis --calorie count is normal, no need for PEG at the moment --Dietitian and SLP following  Prolonged QT interval QTc was 592 on EKG on admission.   Improved - repeat EKG 10/6 QTc 450 ms. --Serial EKG's to monitor --Keep K>4, Mg>2 --Telemetry monitoring  Malignant neoplasm of upper lobe of right lung Malignant neoplasm of lower third of esophagus Patient is status post radiation therapy for esophageal cancer S/p radiation therapy for lung cancer Follow-up with oncology  Recurrent right pleural effusion Presented with shortness of breath, dyspnea on exertion with chest discomfort, desaturating to the low 80s on her baseline flow rate.  Chest xray with increasing right pleural effusion from prior, BNP over 2000.   Chart reviewed: prior thoracenteses done this year on 1/30, 2/8, 4/16, 5/12, 7/25, 8/8, 9/5 --IR was consulted on admission. --Underwent thoracentesis on 10/5 with 900 cc fluid removed. --Pulmonology consulted, recommends against repeat thora if possible  - diuresis as above - continues to require HF [place]  Paroxysmal atrial fibrillation (HCC) controlled - cont metop  HLD (hyperlipidemia) On Lipitor  Abdominal pain Resolved, ct nothing acute  Anemia of chronic disease Hemoglobin at baseline  Hypotension History of same intermittently and limiting GDMT, and in  setting of diuresis.  No fever or leukocytosis  Chronic obstructive pulmonary disease (HCC) Severe COPD with chronic respiratory failure on 4 L of continuous O2 at home. --Continue scheduled and PRN bronchodilators  --Continue inhaled steroids --Pulmonology following  Aki on ckd 3b Developed AKI likely due to diuresis, cardiorenal physiology. Gfr stable here in upper 20s, slowly worsening with diuresis Nephrology following.  Depression Continue Celexa 20 mg daily  Carcinoma of overlapping sites of left breast in female, estrogen receptor positive ([place]) Noted. No acute issues.        Subjective: breathing a bit worse    Physical Exam: Vitals:   07/31/22 0744 07/31/22 0823 07/31/22 1212 07/31/22 1419  BP:  96/69 105/86   Pulse:  95 92   Resp:  20 (!) 24   Temp:  97.7 F (36.5 C) 97.9 F (36.6 C)   TempSrc:  Oral    SpO2: 97% 92% 91% (!) 89%  Weight:      Height:       General exam: awake, alert, no acute distress HEENT: moist mucus membranes, hearing grossly normal  Respiratory system: rales at bases, normal wob, mild tachypnea, decreased sounds right base Cardiovascular system: normal D7/O2,  RRR, systolic murmur noted, no peripheral edema Gastrointestinal system: abdomen is soft, non-tender, no guarding or rebound Central nervous system: A&O x3. no gross focal neurologic deficits, normal speech Extremities: moves all, no edema, normal tone Psychiatry: normal mood, congruent affect, judgement and insight appear normal   Family Communication: Daughter updated telephonically 10/17  Consults: Palliative Care Pulmonology Nephrology   Disposition: Status is: Inpatient Remains inpatient appropriate because: worsening kidney function and respiratory status   Planned Discharge Destination: Home with home health    Author: Desma Maxim, MD 07/31/2022 3:56 PM  For on call review [email or web address].

## 2022-07-31 NOTE — Progress Notes (Signed)
[facility]  ROUNDING NOTE   Subjective:   Danielle [name] 66 year old female with past medical conditions including diabetes, hypertension, lymphedema, breast cancer COPD and chronic kidney disease.  Patient presents to the emergency department of shortness of breath and has been admitted for Pleural effusion [J90] Status post thoracentesis [Z98.890] CHF exacerbation (HCC) [I50.9] Pleural effusion on right [J90] Acute on chronic combined systolic and diastolic CHF (congestive heart failure) (HCC) [I50.43] Acute on chronic respiratory failure with hypoxia ([place]) [J96.21]  Patient is known to our practice and is followed outpatient by Dr. Holley Raring.  She was last seen in office on April 19, 2022.     Patient was seen today on second floor. Patient informed me that she was feeling better than yesterday todayI again discussed the patient  kidney related issues to the best of my ability    Objective:  Vital signs in last 24 hours:  Temp:  [97.5 F (36.4 C)-98.1 F (36.7 C)] 97.5 F (36.4 C) (10/17 0513) Pulse Rate:  [68-97] 68 (10/17 0513) Resp:  [18-22] 18 (10/17 0513) BP: (91-118)/(55-87) 91/76 (10/17 0513) SpO2:  [93 %-100 %] 100 % (10/17 0513)  Weight change:  Filed Weights   07/25/22 0352 07/27/22 0449 07/28/22 0419  Weight: 51.1 kg 51.2 kg 47.9 kg    Intake/Output: I/O last 3 completed shifts: In: 360 [P.O.:360] Out: 1400 [Urine:1400]   Intake/Output this shift:  No intake/output data recorded.  Physical Exam: General: Sitting side of bed  Head: Normocephalic, atraumatic. Moist oral mucosal membranes  Eyes: Anicteric  Lungs:  Mild respiratory distress,Decreased breath sounds at bases,   Heart: Irregular rate and rhythm  Abdomen:  Soft, tender, nondistended  Extremities: No peripheral edema.  Neurologic: Nonfocal, moving all four extremities  Skin: No lesions  Access: None    Basic Metabolic Panel: Recent Labs  Lab 07/25/22 0443 07/26/22 0409  07/27/22 0710 07/28/22 0425 07/28/22 1410 07/29/22 0416 07/30/22 0442 07/31/22 0425  NA 140 141   < > 141 140 142 143 141  K 4.2 4.3   < > 4.3 4.1 4.4 4.1 3.9  CL 105 110   < > 104 102 105 105 103  CO2 25 26   < > 27 27 27 28 27   GLUCOSE 93 97   < > 72 168* 101* 92 119*  BUN 42* 46*   < > 46* 48* 57* 61* 63*  CREATININE 2.27* 2.32*   < > 1.98* 2.34* 2.45* 2.36* 2.48*  CALCIUM 8.9 8.5*   < > 9.1 9.0 8.9 9.0 9.1  MG 2.2  --   --   --   --   --   --   --   PHOS  --  6.0*  --   --   --   --   --   --    < > = values in this interval not displayed.    Liver Function Tests: Recent Labs  Lab 07/26/22 0409  ALBUMIN 3.1*   No results for input(s): "LIPASE", "AMYLASE" in the last 168 hours. No results for input(s): "AMMONIA" in the last 168 hours.  CBC: Recent Labs  Lab 07/28/22 1410 07/31/22 0425  WBC 6.6 5.5  NEUTROABS 4.7  --   HGB 10.4* 9.4*  HCT 32.8* 29.5*  MCV 97.9 97.4  PLT 230 223    Cardiac Enzymes: No results for input(s): "CKTOTAL", "CKMB", "CKMBINDEX", "TROPONINI" in the last 168 hours.  BNP: Invalid input(s): "POCBNP"  CBG: No results for  input(s): "GLUCAP" in the last 168 hours.  Microbiology: Results for orders placed or performed during the hospital encounter of 07/23/2022  Resp Panel by RT-PCR (Flu A&B, Covid) Anterior Nasal Swab     Status: None   Collection Time: 07/31/2022  6:43 PM   Specimen: Anterior Nasal Swab  Result Value Ref Range Status   SARS Coronavirus 2 by RT PCR NEGATIVE NEGATIVE Final    Comment: (NOTE) SARS-CoV-2 target nucleic acids are NOT DETECTED.  The SARS-CoV-2 RNA is generally detectable in upper respiratory specimens during the acute phase of infection. The lowest concentration of SARS-CoV-2 viral copies this assay can detect is 138 copies/mL. A negative result does not preclude SARS-Cov-2 infection and should not be used as the sole basis for treatment or other patient management decisions. A negative result may occur  with  improper specimen collection/handling, submission of specimen other than nasopharyngeal swab, presence of viral mutation(s) within the areas targeted by this assay, and inadequate number of viral copies(<138 copies/mL). A negative result must be combined with clinical observations, patient history, and epidemiological information. The expected result is Negative.  Fact Sheet for Patients:  EntrepreneurPulse.com.au  Fact Sheet for Healthcare Providers:  IncredibleEmployment.be  This test is no t yet approved or cleared by the [place] FDA and  has been authorized for detection and/or diagnosis of SARS-CoV-2 by FDA under an Emergency Use Authorization (EUA). This EUA will remain  in effect (meaning this test can be used) for the duration of the COVID-19 declaration under Section 564(b)(1) of the Act, 21 U.S.C.section 360bbb-3(b)(1), unless the authorization is terminated  or revoked sooner.       Influenza A by PCR NEGATIVE NEGATIVE Final   Influenza B by PCR NEGATIVE NEGATIVE Final    Comment: (NOTE) The Xpert Xpress SARS-CoV-2/FLU/RSV plus assay is intended as an aid in the diagnosis of influenza from Nasopharyngeal swab specimens and should not be used as a sole basis for treatment. Nasal washings and aspirates are unacceptable for Xpert Xpress SARS-CoV-2/FLU/RSV testing.  Fact Sheet for Patients: EntrepreneurPulse.com.au  Fact Sheet for Healthcare Providers: IncredibleEmployment.be  This test is not yet approved or cleared by the [place] FDA and has been authorized for detection and/or diagnosis of SARS-CoV-2 by FDA under an Emergency Use Authorization (EUA). This EUA will remain in effect (meaning this test can be used) for the duration of the COVID-19 declaration under Section 564(b)(1) of the Act, 21 U.S.C. section 360bbb-3(b)(1), unless the authorization is terminated  or revoked.  Performed at [facility], [address] [place], [place] [address]     Coagulation Studies: No results for input(s): "LABPROT", "INR" in the last 72 hours.  Urinalysis: No results for input(s): "COLORURINE", "LABSPEC", "PHURINE", "GLUCOSEU", "HGBUR", "BILIRUBINUR", "KETONESUR", "PROTEINUR", "UROBILINOGEN", "NITRITE", "LEUKOCYTESUR" in the last 72 hours.  Invalid input(s): "APPERANCEUR"    Imaging: DG Chest Port 1 View  Result Date: 07/30/2022 CLINICAL DATA:  Shortness of breath. Pleural effusion. EXAM: PORTABLE CHEST 1 VIEW COMPARISON:  07/29/2022 FINDINGS: Stable enlarged cardiac silhouette, right pleural effusion, mildly prominent interstitial markings, right upper lung zone linear atelectasis or scarring and ill-defined opacity in the right lower lung zone. IMPRESSION: 1. Stable cardiomegaly and mild interstitial lung disease with possible superimposed mild interstitial pulmonary edema. 2. Stable right pleural effusion. 3. Stable right upper lung zone linear atelectasis or scarring and increased density in the right lower lung zone due to the pleural effusion with possible atelectasis or pneumonia. Electronically Signed   By: Remo Lipps  Joneen Caraway M.D.   On: 07/30/2022 11:41   DG Chest Port 1 View  Result Date: 07/29/2022 CLINICAL DATA:  Dyspnea EXAM: PORTABLE CHEST 1 VIEW COMPARISON:  07/28/2022 FINDINGS: Unchanged appearance of right hilum and parahilar masslike opacity. Mild interstitial opacity without overt edema. Cardiomediastinal contours are normal. Suspect layering right basilar effusion. IMPRESSION: 1. Unchanged appearance of right hilum and parahilar masslike opacity. 2. Suspect layering right basilar effusion. Electronically Signed   By: Ulyses Jarred M.D.   On: 07/29/2022 21:07     Medications:      amiodarone  200 mg Oral Daily   apixaban  2.5 mg Oral BID   atorvastatin  40 mg Oral Daily   citalopram  20 mg Oral Daily   feeding supplement  1  Container Oral TID BM   metoprolol tartrate  12.5 mg Oral BID   multivitamin with minerals  1 tablet Oral Daily   pantoprazole  40 mg Oral BID AC   polyethylene glycol  17 g Oral Daily   sodium bicarbonate  1,300 mg Oral Daily   sodium chloride flush  3 mL Intravenous Q12H   spironolactone  12.5 mg Oral Daily   torsemide  40 mg Oral Daily   acetaminophen **OR** acetaminophen, calcium carbonate, guaiFENesin, HYDROcodone-acetaminophen, melatonin, simethicone  Assessment/ Plan:  Danielle [name] is a 66 y.o.  female with past medical conditions including diabetes, hypertension, lymphedema, breast cancer COPD and chronic kidney disease.  Patient presents to the emergency department of shortness of breath and has been admitted for Pleural effusion [J90] Status post thoracentesis [Z98.890] CHF exacerbation ([place]) [I50.9] Pleural effusion on right [J90] Acute on chronic combined systolic and diastolic CHF (congestive heart failure) (HCC) [I50.43] Acute on chronic respiratory failure with hypoxia (HCC) [J96.21]   Acute Kidney Injury on chronic kidney disease stage IIIb with baseline creatinine 1.50 and GFR of 38 on 06/16/22.  Acute kidney injury secondary to cardiorenal syndrome and diuretic therapy Chronic kidney disease is secondary to hypertension and CHF.  No IV contrast exposure.   Patient peak creatinine was at 2.7 on October 6 thereafter . Patient creatinine has worsened recently from 2.0 to now 2.5. Educated patient about the worsening of her kidney disease.  We will continue patient on diuretics  Lab Results  Component Value Date   CREATININE 2.48 (H) 07/31/2022   CREATININE 2.36 (H) 07/30/2022   CREATININE 2.45 (H) 07/29/2022    Intake/Output Summary (Last 24 hours) at 07/31/2022 0712 Last data filed at 07/31/2022 0127 Gross per 24 hour  Intake 360 ml  Output 1150 ml  Net -790 ml    2. Anemia of chronic kidney disease Lab Results  Component Value Date   HGB 9.4 (L)  07/31/2022    Hemoglobin at goal  3.  Hypertension with chronic kidney disease.  Home regimen includes torsemide, amiodarone, hydralazine, and isosorbide. Continue Midodrine 5mg  three times daily for BP support.   Patient is currently on torsemide  4.  Pleural effusions, recurrent.   Patient is being followed by pulmonary   5.Acute on chronic Combined systolic and diastolic CHF Patient had increased shortness of breath last night I increased the dose of torsemide  6.Social Patient has poor prognosis because of multiorgan disease process.  Patient is being followed by primary team and palliative care team has been consulted as well   LOS: 13 Ira Dougher s [facility] 10/17/20237:12 AM

## 2022-07-31 NOTE — TOC Progression Note (Signed)
Transition of Care [facility]) - Progression Note    Patient Details  Name: Danielle [name] MRN: [id number] Date of Birth: 09-Aug-1956  Transition of Care [facility]) CM/SW [place], LCSW Phone Number: 07/31/2022, 2:39 PM  Clinical Narrative:  OT recommending a shower chair. Patient declined, stating she already has a 3-in-1.   Expected Discharge Plan: Home/Self Care Barriers to Discharge: Continued Medical Work up  Expected Discharge Plan and Services Expected Discharge Plan: Home/Self Care   Discharge Planning Services: CM Consult   Living arrangements for the past 2 months: Single Family Home                                       Social Determinants of Health (SDOH) Interventions    Readmission Risk Interventions    05/09/2022    3:11 PM 01/29/2022   11:36 AM 11/29/2021   10:49 AM  Readmission Risk Prevention Plan  Transportation Screening Complete Complete Complete  Medication Review Press photographer) Complete Complete Complete  PCP or Specialist appointment within 3-5 days of discharge  Complete Complete  HRI or Home Care Consult Patient refused  Complete  SW Recovery Care/Counseling Consult Complete Complete Complete  Palliative Care Screening Not Applicable Not Applicable Not [place] Not Applicable Not Applicable Not Applicable

## 2022-07-31 NOTE — Progress Notes (Addendum)
Daily Progress Note   Patient Name: Danielle [name]       Date: 07/31/2022 DOB: 27-Oct-1955  Age: 66 y.o. MRN#: [id number] Attending Physician: Gwynne Edinger, MD Primary Care Physician: [name], [name] Admit Date: 07/19/2022  Reason for Consultation/Follow-up: Establishing goals of care  Subjective: Notes and labs reviewed.  Into see patient.  Family member is at bedside.  She states her breathing is no better, and no worse.  She states her breathing is okay as long as she is sitting still.  She states she and her family talked over the weekend, and she still wants to do what she can do to try to live as long as possible. She states that she is not concerned with her quality of life. She is aware that her respiratory symptoms will continue to worsen over time.    Full scope/full code at this time.  Recommend outpatient palliative patient is amenable.  Length of Stay: 13  Current Medications: Scheduled Meds:   amiodarone  200 mg Oral Daily   apixaban  2.5 mg Oral BID   atorvastatin  40 mg Oral Daily   citalopram  20 mg Oral Daily   feeding supplement  1 Container Oral TID BM   metoprolol tartrate  12.5 mg Oral BID   multivitamin with minerals  1 tablet Oral Daily   pantoprazole  40 mg Oral BID AC   polyethylene glycol  17 g Oral Daily   sodium bicarbonate  1,300 mg Oral Daily   sodium chloride flush  3 mL Intravenous Q12H   spironolactone  12.5 mg Oral Daily   torsemide  40 mg Oral Daily    Continuous Infusions:   PRN Meds: acetaminophen **OR** acetaminophen, calcium carbonate, guaiFENesin, HYDROcodone-acetaminophen, melatonin, simethicone  Physical Exam Pulmonary:     Effort: Pulmonary effort is normal.  Neurological:     Mental Status: She is alert.              Vital Signs: BP 96/69 (BP Location: Right Arm)   Pulse 95   Temp 97.7 F (36.5 C) (Oral)   Resp 20   Ht 5\' 6"  (1.676 m)   Wt 47.9 kg   SpO2 92%   BMI 17.04 kg/m  SpO2: SpO2: 92 % O2 Device: O2 Device: Nasal Cannula O2 Flow Rate: O2  Flow Rate (L/min): 7 L/min  Intake/output summary:  Intake/Output Summary (Last 24 hours) at 07/31/2022 1118 Last data filed at 07/31/2022 0127 Gross per 24 hour  Intake --  Output 500 ml  Net -500 ml   LBM: Last BM Date : 07/30/22 Baseline Weight: Weight: 50.3 kg Most recent weight: Weight: 47.9 kg      Patient Active Problem List   Diagnosis Date Noted   Esophageal dysmotility 07/21/2022   Prolonged QT interval 07/19/2022   Streptococcal pneumonia ([place]) 06/20/2022   Dyspnea 05/23/2022   SOB (shortness of breath) 05/22/2022   Chronic respiratory failure with hypoxia (HCC)    Recurrent right pleural effusion 05/08/2022   Paroxysmal atrial fibrillation ([place]) 03/10/2022   Dyslipidemia 03/10/2022   Essential hypertension 03/10/2022   COPD, severe ([name]) 03/10/2022   Atrial fibrillation with RVR ([place]) 02/22/2022   TIA (transient ischemic attack) 01/29/2022   Epistaxis 01/27/2022   C. difficile colitis 11/27/2021   Abdominal pain 11/26/2021   HLD (hyperlipidemia) 11/26/2021   Iron deficiency anemia 11/26/2021   Aspiration pneumonia of right lower lobe due to gastric secretions (HCC)    Ileus following gastrointestinal surgery ([place])    Hypomagnesemia    Anemia of chronic disease    Hyperphosphatemia 10/24/2021   Hypotension 11/17/2020   Ileostomy status ([place]) 11/17/2020   Chronic obstructive pulmonary disease ([place]) 08/01/2020   CKD (chronic kidney disease), stage IV ([place]) 01/20/2020   Hypokalemia 01/20/2020   Elevated troponin 01/20/2020   Sepsis ([place]) 01/20/2020   Acute on chronic combined systolic and diastolic CHF (congestive heart failure) ([place]) 01/20/2020   Pleural effusion due to CHF (congestive heart failure) ([place])  11/06/2019   Diarrhea    GERD (gastroesophageal reflux disease) 10/29/2019   Depression 10/29/2019   Acute blood loss anemia 06/20/2019   Normocytic anemia 06/20/2019   Anemia of chronic kidney failure, stage 4 (severe) ([place]) 06/12/2019   Malignant neoplasm of lower third of esophagus ([place]) 05/01/2019   Acute on chronic respiratory failure with hypoxia and hypercapnia ([place]) 12/22/2018   Ileostomy dysfunction ([place]) 09/28/2018   Reflux esophagitis    Iron deficiency anemia secondary to blood loss (chronic)    SBO (small bowel obstruction) ([place]) 06/29/2018   Atrial flutter ([place]) 06/09/2018   Lymphedema 06/09/2018   Acute kidney injury superimposed on CKD ([place]) 06/04/2018   Severe protein-calorie malnutrition ([place]) 06/04/2018   Focal (segmental) acute (reversible) ischemia of large intestine (HCC)    Ulceration of intestine    Diverticulosis of large intestine without diverticulitis    Colitis    Malnutrition of moderate degree 04/23/2018   Palliative care by specialist    DNR (do not resuscitate) discussion    Weakness    Acute on chronic respiratory failure with hypoxia ([place]) 04/21/2018   Hyponatremia 02/10/2017   Malignant neoplasm of upper lobe of right lung 07/30/2016   Lung nodule, solitary 07/25/2016   Malignant neoplasm of upper lobe of right lung ([place]) 07/25/2016   Carcinoma of overlapping sites of left breast in female, estrogen receptor positive ([place]) 06/22/2016   Multiple lung nodules 06/22/2016   Lesion of right lung    Breast cancer in female ([place]) 02/21/2016   Moderate COPD (chronic obstructive pulmonary disease) ([place]) 08/24/2014    Palliative Care Assessment & Plan     Recommendations/Plan: Full code/full scope-patient states she is not concerned for quality of life, she would like to do what is possible to live as long as she is able to regardless of pain or suffering.  Could place a Pleurx catheter for pleural effusion reaccumulation. Recommend outpatient  palliative to follow at home if patient is amenable. PMT will sign off at this time  Code Status:    Code Status Orders  (From admission, onward)           Start     Ordered   08/13/2022 1945  Full code  Continuous        08/10/2022 1946           Code Status History     Date Active Date Inactive Code Status Order ID Comments User Context   06/17/2022 1013 06/21/2022 1951 Full Code [id number]  Clance Boll, MD ED   05/22/2022 1917 05/26/2022 2026 Full Code [id number]  Para Skeans, MD ED   05/08/2022 1108 05/12/2022 1654 Full Code [id number]  Collier Bullock, MD ED   03/10/2022 0151 03/11/2022 2340 Full Code [id number]  Mansy, Arvella Merles, MD ED   02/21/2022 2006 02/24/2022 1903 Full Code [id number]  Clance Boll, MD ED   01/26/2022 2006 02/04/2022 1500 Full Code [id number]  Clarnce Flock, MD Inpatient   11/26/2021 1623 11/29/2021 2154 Full Code [id number]  Ivor Costa, MD ED   10/23/2021 1800 11/24/2021 1619 Full Code [id number]  Herbert Pun, MD Inpatient   11/17/2020 0432 11/18/2020 1748 Full Code [id number]  Athena Masse, MD ED   11/05/2020 0604 11/10/2020 1631 Full Code [id number]  Benjamine Sprague, [place] ED   08/01/2020 1124 08/05/2020 2228 Full Code [id number]  Collier Bullock, MD ED   01/20/2020 0807 01/21/2020 2051 Full Code [id number]  Ivor Costa, MD ED   11/06/2019 1648 11/11/2019 1749 Full Code [id number]  Max Sane, MD ED   10/29/2019 1436 11/04/2019 1859 Full Code [id number]  Ivor Costa, MD ED   10/14/2019 0253 10/21/2019 1810 Full Code [id number]  Athena Masse, MD ED   09/27/2019 0211 10/02/2019 1736 Full Code [id number]  Athena Masse, MD ED   06/19/2019 1124 06/20/2019 1742 Full Code [id number]  Lang Snow, NP ED   12/22/2018 0844 12/24/2018 1727 Full Code [id number]  Harrie Foreman Inpatient   09/28/2018 0813 10/04/2018 1522 Full Code [id number]  Herbert Pun, MD Inpatient   09/05/2018 0616 09/10/2018 1708 Full Code [id number]  Arta Silence, MD ED   08/12/2018  0540 08/16/2018 2057 Full Code [id number]  Arta Silence, MD ED   07/21/2018 0332 07/23/2018 1718 Full Code [id number]  Derrek Monaco, RN Inpatient   07/18/2018 0640 07/21/2018 0330 DNR [id number]  Harrie Foreman Inpatient   07/05/2018 0808 07/09/2018 1946 DNR [id number]  Harrie Foreman Inpatient   06/30/2018 1733 07/02/2018 1507 DNR [id number]  [name], MD Inpatient   06/30/2018 0019 06/30/2018 1733 Full Code [id number]  Amelia Jo, MD Inpatient   06/04/2018 0113 06/05/2018 1448 Full Code [id number]  Amelia Jo, MD Inpatient   04/21/2018 0633 05/17/2018 2214 Full Code [id number]  Arta Silence, MD Inpatient   02/09/2017 1125 02/11/2017 1706 Full Code [id number]  Henreitta Leber, MD Inpatient       Prognosis  Poor    Thank you for allowing the Palliative Medicine Team to assist in the care of this patient.     Asencion Gowda, NP  Please contact Palliative Medicine Team phone at [phone] for questions and concerns.

## 2022-07-31 NOTE — Progress Notes (Signed)
PULMONOLOGY         Date: 07/31/2022,   MRN# [id number] Danielle [name] 07-07-1956     AdmissionWeight: 50.3 kg                 CurrentWeight: 47.9 kg  Referring provider: Dr Arbutus Ped   CHIEF COMPLAINT:   Recurrent pleural effusions and advanced metastatic cancer.    HISTORY OF PRESENT ILLNESS   This is a very pleasant 66yo F with hx of advanced COPD, chronic anemia, paroxysmal AFchronic anticoagulation therapy, history of chronic systolic heart failure, history of adenocarcinoma of the right lung status post radiation therapy, history of stage IIIb chronic kidney disease, aortic valve stenosis, COPD with chronic respiratory failure on 4 L of oxygen continuous, history of left breast cancer status post mastectomy, history of esophageal cancer recurrent right-sided pleural effusion requiring thoracentesis Most recently on 06/19/2022 who presents to the ER for evaluation of worsening shortness of breath from her baseline.  She has associated chest tightness. She denies having mucus on expectoration, she is on 3-4L/min during my evaluation and notes when attempting to ambulate she has dyspnea.   07/24/22- patient is mildly imprved with bronchopulmonary hygiene .  Continue current care and may initiate DC planning.   07/25/22- no acute events overnight continue current care plan.  She is close to baseline.   07/26/22- patient reports slight worsening and is now on 7L/min.    07/27/22-  patient still not improved with cxr showing ongoing edema and effusions. I have stopped her metoprolol and midodrine due to drug/drug interaction and have ordered torsemide to diurese her despite CKD.  She is overall chronically ill with severe comorbid conditions. She has palliative care consultation ordered but remains full code which seems too aggressive for her current state of health.   07/28/22- patient is on 13L/min this am which is improved from yesterday.  She is on torsemide now and seems  to be putting out more fluid.  She started to expectorate some mucus this am. Will order aldactone 12.5 in additon to torsemide.  07/29/22- patient mildly improved will continue diuresis wit fluid restricted diet  07/30/22- despite more aggressive diuresis patient has not improved and continues to require increased O2 via [place].  CXR with small amount of edema.  There is masslike consolidation and layering effusion.  CKD is no better and she may need thoracentesis or dialysis for fluid management.    07/31/22- patient is minimally improved despite aggressive medical management.  I recommend palliative care evaluation or hospice.   PAST MEDICAL HISTORY   Past Medical History:  Diagnosis Date   Acute respiratory failure due to COVID-19 ([place]) 09/27/2019   Anemia    Anxiety    Aortic atherosclerosis ([place])    Aortic valve stenosis 02/10/2018   a.) TTE 02/10/2018: EF 55-60%: mild AS with MPG of 12 mmHg. b.) TTE 04/21/2018: EF 35-40%; mild AS with MPG 8 mmHg. c.) TTE 11/07/2019: EF 50-55%; mild AS with MPG 10 mmHg.   Arthritis    Atrial fibrillation (HCC)    a.) CHA2DS2-VASc = 4 (age, sex, HTN, aortic plaque). b.) Rate/rhythm maintained on oral carvedilol; chronically anticoagulated on dose reduced apixaban. c.)  Attempted deployment of LAA occlusive device on 01/11/2021; parameters failed and procedure aborted.   Breast cancer, left ([name]) 2000   a.) T2N1M0; ER/PR (+) --> Tx'd with total mastectomy, LN resection, XRT, and chemotherapy   Cancer of right lung ([place]) 07/30/2016   a.) adenocarcinoma; ALK,  ROS1, PDL1, BRAF, EGFR all negative.   CHF (congestive heart failure) (HCC)    Chronic diastolic CHF (congestive heart failure) ([place]) 11/26/2021   CKD (chronic kidney disease), stage IV (HCC)    COPD (chronic obstructive pulmonary disease) (HCC)    Dependence on supplemental oxygen    Depression    Diastolic dysfunction 84/13/2440   a.) TTE 02/10/2017: EF 55-60%; G2DD. b.) TTE 04/21/2018: EF  35-40%; mild LA dilation; mod MV regurgitation. c.) TTE 11/07/2019: EF 50-55%; G1DD.   DOE (dyspnea on exertion)    GERD (gastroesophageal reflux disease)    Heart murmur    History of 2019 novel coronavirus disease (COVID-19) 10/14/2019   HLD (hyperlipidemia)    Hypertension    Long term current use of anticoagulant    a.) apixaban   Lymphedema    Personal history of chemotherapy    Personal history of radiation therapy    Respiratory tract infection due to COVID-19 virus 09/27/2019   SBO (small bowel obstruction) ([place]) 11/05/2020   Vitamin D deficiency      SURGICAL HISTORY   Past Surgical History:  Procedure Laterality Date   Breast Biospy Left    [facility]   BREAST SURGERY     CARDIOVERSION N/A 02/23/2022   Procedure: CARDIOVERSION;  Surgeon: Corey Skains, MD;  Location: [facility] ORS;  Service: Cardiovascular;  Laterality: N/A;   COLONOSCOPY N/A 04/30/2018   Procedure: COLONOSCOPY;  Surgeon: Virgel Manifold, MD;  Location: [facility] ENDOSCOPY;  Service: Endoscopy;  Laterality: N/A;   COLONOSCOPY N/A 07/22/2018   Procedure: COLONOSCOPY;  Surgeon: Virgel Manifold, MD;  Location: [facility] ENDOSCOPY;  Service: Endoscopy;  Laterality: N/A;   COLONOSCOPY WITH PROPOFOL N/A 09/21/2021   Procedure: COLONOSCOPY WITH PROPOFOL;  Surgeon: Benjamine Sprague, DO;  Location: [facility] ENDOSCOPY;  Service: General;  Laterality: N/A;   COLONOSCOPY WITH PROPOFOL N/A 03/11/2022   Procedure: COLONOSCOPY WITH PROPOFOL;  Surgeon: Annamaria Helling, DO;  Location: [facility] ENDOSCOPY;  Service: Gastroenterology;  Laterality: N/A;   DILATION AND CURETTAGE OF UTERUS     ELECTROMAGNETIC NAVIGATION BROCHOSCOPY Right 04/11/2016   Procedure: ELECTROMAGNETIC NAVIGATION BRONCHOSCOPY;  Surgeon: Vilinda Boehringer, MD;  Location: [facility] ORS;  Service: Cardiopulmonary;  Laterality: Right;   ESOPHAGOGASTRODUODENOSCOPY N/A 07/22/2018   Procedure: ESOPHAGOGASTRODUODENOSCOPY (EGD);  Surgeon: Virgel Manifold, MD;  Location: [facility]  ENDOSCOPY;  Service: Endoscopy;  Laterality: N/A;   ESOPHAGOGASTRODUODENOSCOPY (EGD) WITH PROPOFOL N/A 05/07/2018   Procedure: ESOPHAGOGASTRODUODENOSCOPY (EGD) WITH PROPOFOL;  Surgeon: Lucilla Lame, MD;  Location: [facility] ENDOSCOPY;  Service: Endoscopy;  Laterality: N/A;   ESOPHAGOGASTRODUODENOSCOPY (EGD) WITH PROPOFOL N/A 04/24/2019   Procedure: ESOPHAGOGASTRODUODENOSCOPY (EGD) WITH PROPOFOL;  Surgeon: Jonathon Bellows, MD;  Location: [facility] ENDOSCOPY;  Service: Gastroenterology;  Laterality: N/A;   ESOPHAGOGASTRODUODENOSCOPY (EGD) WITH PROPOFOL N/A 01/12/2020   Procedure: ESOPHAGOGASTRODUODENOSCOPY (EGD) WITH PROPOFOL;  Surgeon: Jonathon Bellows, MD;  Location: [facility] ENDOSCOPY;  Service: Gastroenterology;  Laterality: N/A;   ESOPHAGOGASTRODUODENOSCOPY (EGD) WITH PROPOFOL N/A 04/28/2020   Procedure: ESOPHAGOGASTRODUODENOSCOPY (EGD) WITH PROPOFOL;  Surgeon: Jonathon Bellows, MD;  Location: [facility] ENDOSCOPY;  Service: Gastroenterology;  Laterality: N/A;   ESOPHAGOGASTRODUODENOSCOPY (EGD) WITH PROPOFOL N/A 03/11/2022   Procedure: ESOPHAGOGASTRODUODENOSCOPY (EGD) WITH PROPOFOL;  Surgeon: Annamaria Helling, DO;  Location: [facility] ENDOSCOPY;  Service: Gastroenterology;  Laterality: N/A;   EUS N/A 05/07/2019   Procedure: FULL UPPER ENDOSCOPIC ULTRASOUND (EUS) RADIAL;  Surgeon: Jola Schmidt, MD;  Location: [facility] ENDOSCOPY;  Service: Endoscopy;  Laterality: N/A;   ILEOSCOPY N/A 07/22/2018   Procedure: ILEOSCOPY THROUGH STOMA;  Surgeon: [name]  B, MD;  Location: [facility] ENDOSCOPY;  Service: Endoscopy;  Laterality: N/A;   ILEOSTOMY     ILEOSTOMY N/A 09/08/2018   Procedure: ILEOSTOMY REVISION POSSIBLE CREATION;  Surgeon: Herbert Pun, MD;  Location: [facility] ORS;  Service: General;  Laterality: N/A;   ILEOSTOMY CLOSURE N/A 08/15/2018   Procedure: DILATION OF ILEOSTOMY STRICTURE;  Surgeon: Herbert Pun, MD;  Location: [facility] ORS;  Service: General;  Laterality: N/A;   LAPAROTOMY Right 05/04/2018   Procedure:  EXPLORATORY LAPAROTOMY right colectomy right and left ostomy;  Surgeon: Herbert Pun, MD;  Location: [facility] ORS;  Service: General;  Laterality: Right;   LEFT ATRIAL APPENDAGE OCCLUSION N/A 01/11/2021   Procedure: LEFT ATRIAL APPENDAGE OCCLUSION ([place]); ABORTED PROCEDURE WITHOUT DEVICE BEING IMPLANTED; Location: [facility]; Surgeon: Mylinda Latina, MD   LUNG BIOPSY     MASTECTOMY Left    2000, [place] Right    [place]   XI ROBOTIC ASSISTED COLOSTOMY TAKEDOWN N/A 10/23/2021   Procedure: XI ROBOTIC ASSISTED ILEOSTOMY TAKEDOWN;  Surgeon: Herbert Pun, MD;  Location: [facility] ORS;  Service: General;  Laterality: N/A;  180 minutes for the surgery part please     FAMILY HISTORY   Family History  Problem Relation Age of Onset   Breast cancer Mother 72   Cancer Mother        Breast    Cirrhosis Father    Breast cancer Paternal Aunt 58   Cancer Maternal Aunt        Breast      SOCIAL HISTORY   Social History   Tobacco Use   Smoking status: Former    Packs/day: 0.50    Years: 20.00    Total pack years: 10.00    Types: Cigarettes    Quit date: 07/02/2012    Years since quitting: 10.0   Smokeless tobacco: Current    Types: Snuff  Vaping Use   Vaping Use: Never used  Substance Use Topics   Alcohol use: Yes    Alcohol/week: 3.0 standard drinks of alcohol    Types: 3 Cans of beer per week    Comment: Occasionally beer   Drug use: No     MEDICATIONS    Home Medication:    Current Medication:  Current Facility-Administered Medications:    acetaminophen (TYLENOL) tablet 650 mg, 650 mg, Oral, Q6H PRN, 650 mg at 07/29/22 1014 **OR** acetaminophen (TYLENOL) suppository 650 mg, 650 mg, Rectal, Q6H PRN, Athena Masse, MD   amiodarone (PACERONE) tablet 200 mg, 200 mg, Oral, Daily, Judd Gaudier V, MD, 200 mg at 07/31/22 4818   apixaban (ELIQUIS) tablet 2.5 mg, 2.5 mg, Oral, BID, Judd Gaudier V, MD, 2.5 mg at 07/31/22 5631   atorvastatin  (LIPITOR) tablet 40 mg, 40 mg, Oral, Daily, Judd Gaudier V, MD, 40 mg at 07/31/22 4970   calcium carbonate (TUMS - dosed in mg elemental calcium) chewable tablet 200 mg of elemental calcium, 1 tablet, Oral, TID PRN, Nicole Kindred A, DO, 200 mg of elemental calcium at 07/24/22 2203   citalopram (CELEXA) tablet 20 mg, 20 mg, Oral, Daily, Nicole Kindred A, DO, 20 mg at 07/31/22 2637   feeding supplement (BOOST / RESOURCE BREEZE) liquid 1 Container, 1 Container, Oral, TID BM, Nicole Kindred A, DO, 1 Container at 07/31/22 0944   guaiFENesin (MUCINEX) 12 hr tablet 1,200 mg, 1,200 mg, Oral, BID PRN, Gwynne Edinger, MD, 1,200 mg at 07/31/22 0019   HYDROcodone-acetaminophen (NORCO/VICODIN) 5-325 MG per tablet 1-2 tablet, 1-2 tablet, Oral,  Q4H PRN, Athena Masse, MD, 2 tablet at 07/31/22 1118   melatonin tablet 5 mg, 5 mg, Oral, QHS PRN, Sharion Settler, NP, 5 mg at 07/30/22 2129   metoprolol tartrate (LOPRESSOR) tablet 12.5 mg, 12.5 mg, Oral, BID, Lanney Gins, Sera Hitsman, MD, 12.5 mg at 07/31/22 3545   multivitamin with minerals tablet 1 tablet, 1 tablet, Oral, Daily, Nicole Kindred A, DO, 1 tablet at 07/31/22 0928   pantoprazole (PROTONIX) EC tablet 40 mg, 40 mg, Oral, BID AC, Judd Gaudier V, MD, 40 mg at 07/31/22 0928   polyethylene glycol (MIRALAX / GLYCOLAX) packet 17 g, 17 g, Oral, Daily, Breeze, Shantelle, NP, 17 g at 07/31/22 0928   simethicone (MYLICON) 40 GY/5.6LS suspension 40 mg, 40 mg, Oral, QID PRN, Oswald Hillock, RPH, 40 mg at 07/30/22 0932   sodium bicarbonate tablet 1,300 mg, 1,300 mg, Oral, Daily, Judd Gaudier V, MD, 1,300 mg at 07/31/22 9373   sodium chloride flush (NS) 0.9 % injection 3 mL, 3 mL, Intravenous, Q12H, Nicole Kindred A, DO, 3 mL at 07/31/22 0944   spironolactone (ALDACTONE) tablet 12.5 mg, 12.5 mg, Oral, Daily, Lanney Gins, Eliasar Hlavaty, MD, 12.5 mg at 07/31/22 0928   torsemide (DEMADEX) tablet 40 mg, 40 mg, Oral, Daily, Bhutani, Manpreet S, MD, 40 mg at 07/31/22  4287    ALLERGIES   Patient has no known allergies.     REVIEW OF SYSTEMS    Review of Systems:  Gen:  Denies  fever, sweats, chills weigh loss  HEENT: Denies blurred vision, double vision, ear pain, eye pain, hearing loss, nose bleeds, sore throat Cardiac:  No dizziness, chest pain or heaviness, chest tightness,edema Resp:   reports dyspnea chronically  Gi: Denies swallowing difficulty, stomach pain, nausea or vomiting, diarrhea, constipation, bowel incontinence Gu:  Denies bladder incontinence, burning urine Ext:   Denies Joint pain, stiffness or swelling Skin: Denies  skin rash, easy bruising or bleeding or hives Endoc:  Denies polyuria, polydipsia , polyphagia or weight change Psych:   Denies depression, insomnia or hallucinations   Other:  All other systems negative   VS: BP 105/86 (BP Location: Right Arm)   Pulse 92   Temp 97.9 F (36.6 C)   Resp (!) 24   Ht $R'5\' 6"'go$  (1.676 m)   Wt 47.9 kg   SpO2 91%   BMI 17.04 kg/m      PHYSICAL EXAM    GENERAL:NAD, no fevers, chills, no weakness no fatigue HEAD: Normocephalic, atraumatic.  EYES: Pupils equal, round, reactive to light. Extraocular muscles intact. No scleral icterus.  MOUTH: Moist mucosal membrane. Dentition intact. No abscess noted.  EAR, NOSE, THROAT: Clear without exudates. No external lesions.  NECK: Supple. No thyromegaly. No nodules. No JVD.  PULMONARY: decreased breath sounds with mild rhonchi worse at bases bilaterally.  CARDIOVASCULAR: S1 and S2. Regular rate and rhythm. No murmurs, rubs, or gallops. No edema. Pedal pulses 2+ bilaterally.  GASTROINTESTINAL: Soft, nontender, nondistended. No masses. Positive bowel sounds. No hepatosplenomegaly.  MUSCULOSKELETAL: No swelling, clubbing, or edema. Range of motion full in all extremities.  NEUROLOGIC: Cranial nerves II through XII are intact. No gross focal neurological deficits. Sensation intact. Reflexes intact.  SKIN: No ulceration, lesions, rashes,  or cyanosis. Skin warm and dry. Turgor intact.  PSYCHIATRIC: Mood, affect within normal limits. The patient is awake, alert and oriented x 3. Insight, judgment intact.       IMAGING     ASSESSMENT/PLAN   Acute on chronic hypoxemic respiratory failure -patient with advanced COPD/CHF/CKD/lung  cancer -she is with a least moderate severity physical deconditioning -all of these comorbidities are currently playing role in dyspnea -Advanced COPD - continue ICS/LABA/LAMA with Breo Ellipta and Incruse and addition of PRN albuterol nebulizer  -CHF/CKD overlap- adding aldactone 12.5 and reducing metoprolol from 25 to 12.5 bid to allow hemodynamic leveling with midodrine and Atrial fibrillation while diuresing 3. Atelectasis - worse at RLL - Metaneb with saline q8h 4. Right pleural effusion -  adding more diuresis, holding thoracentesis due to high likelyhood of rapid re-accumulation 5. Physical deconditioning- patient is currently with PT/OT and would benefit from same on outpatient 6. Atrial fibrillation - chronic stable - continue eliquis, metoprolol, amiodarone          Thank you for allowing me to participate in the care of this patient.   Patient/Family are satisfied with care plan and all questions have been answered.    Provider disclosure: Patient with at least one acute or chronic illness or injury that poses a threat to life or bodily function and is being managed actively during this encounter.  All of the below services have been performed independently by signing provider:  review of prior documentation from internal and or external health records.  Review of previous and current lab results.  Interview and comprehensive assessment during patient visit today. Review of current and previous chest radiographs/CT scans. Discussion of management and test interpretation with health care team and patient/family.   This document was prepared using Dragon voice recognition software and  may include unintentional dictation errors.     Ottie Glazier, M.D.  Division of Pulmonary & Critical Care Medicine

## 2022-08-01 ENCOUNTER — Inpatient Hospital Stay: Payer: Medicare Other

## 2022-08-01 DIAGNOSIS — J90 Pleural effusion, not elsewhere classified: Secondary | ICD-10-CM | POA: Diagnosis not present

## 2022-08-01 DIAGNOSIS — J9621 Acute and chronic respiratory failure with hypoxia: Secondary | ICD-10-CM | POA: Diagnosis not present

## 2022-08-01 DIAGNOSIS — I5043 Acute on chronic combined systolic (congestive) and diastolic (congestive) heart failure: Secondary | ICD-10-CM | POA: Diagnosis not present

## 2022-08-01 LAB — BASIC METABOLIC PANEL
Anion gap: 12 (ref 5–15)
BUN: 70 mg/dL — ABNORMAL HIGH (ref 8–23)
CO2: 26 mmol/L (ref 22–32)
Calcium: 8.9 mg/dL (ref 8.9–10.3)
Chloride: 101 mmol/L (ref 98–111)
Creatinine, Ser: 2.99 mg/dL — ABNORMAL HIGH (ref 0.44–1.00)
GFR, Estimated: 16 mL/min — ABNORMAL LOW (ref >60)
Glucose, Bld: 95 mg/dL (ref 70–99)
Potassium: 4.2 mmol/L (ref 3.5–5.1)
Sodium: 139 mmol/L (ref 135–145)

## 2022-08-01 MED ORDER — ALBUTEROL SULFATE (2.5 MG/3ML) 0.083% IN NEBU
2.5000 mg | INHALATION_SOLUTION | Freq: Four times a day (QID) | RESPIRATORY_TRACT | Status: DC | PRN
  Administered 2022-08-01 – 2022-08-15 (×2): 2.5 mg via RESPIRATORY_TRACT
  Filled 2022-08-01 (×3): qty 3

## 2022-08-01 MED ORDER — TORSEMIDE 20 MG PO TABS
20.0000 mg | ORAL_TABLET | Freq: Every day | ORAL | Status: DC
  Administered 2022-08-01 – 2022-08-02 (×2): 20 mg via ORAL
  Filled 2022-08-01 (×2): qty 1

## 2022-08-01 NOTE — Progress Notes (Signed)
Nutrition Follow-up  DOCUMENTATION CODES:   Severe malnutrition in context of chronic illness  INTERVENTION:   -Continue Magic cup TID with meals, each supplement provides 290 kcal and 9 grams of protein  -Continue Boost Breeze po TID, each supplement provides 250 kcal and 9 grams of protein  -Continue MVI with minerals daily  NUTRITION DIAGNOSIS:   Severe Malnutrition related to chronic illness as evidenced by severe fat depletion, severe muscle depletion.  Ongoing  GOAL:   Patient will meet greater than or equal to 90% of their needs  Progressing   MONITOR:   PO intake, Supplement acceptance, Labs, Weight trends, Skin, I & O's  REASON FOR ASSESSMENT:   Consult Assessment of nutrition requirement/status  ASSESSMENT:   66 y.o. 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  10/12- calorie count completed; pt consumed 100% of needs  Reviewed I/O's: -200 ml x 24 hours and -2.9 L since admission  UOP: 200 ml x 24 hours  Spoke with pt at bedside, who was sitting up, smiling, and playing solitaire at time of visit. Pt reports she still has a fair appetite and is awaiting on breakfast to come. Pt's meal times have been spaced out per her request, and she thinks this is helping. Pt shares that "I needed to get used to eating like this" as she was only consuming 1 meal per day PTA.  Documented meal completions  75-100%.   Pt continues to drink her Boost Breeze. Discussed importance of good meal and supplement intake to promote healing.   Medications reviewed and include miralax, aldactone, and demadex.   Labs reviewed: CBGS: 116.   Diet Order:   Diet Order             Diet renal with fluid restriction Fluid restriction: 1200 mL Fluid; Room service appropriate? Yes; Fluid consistency: Thin  Diet effective now                   EDUCATION NEEDS:   Education needs have been addressed  Skin:  Skin Assessment: Reviewed RN Assessment  Last BM:  07/31/22  Height:   Ht Readings from Last 1 Encounters:  07/24/22 5\' 6"  (1.676 m)    Weight:   Wt Readings from Last 1 Encounters:  08/01/22 53.6 kg    Ideal Body Weight:  59 kg  BMI:  Body mass index is 19.07 kg/m.  Estimated Nutritional Needs:   Kcal:  1400-1600kcal/day  Protein:  70-80g/day  Fluid:  1.3-1.5L/day    Loistine Chance, RD, LDN, [place] Registered Dietitian II Certified Diabetes Care and Education Specialist Please refer to [facility] for RD and/or RD on-call/weekend/after hours pager

## 2022-08-01 NOTE — Progress Notes (Signed)
[facility]  ROUNDING NOTE   Subjective:   Danielle [name] 66 year old female with past medical conditions including diabetes, hypertension, lymphedema, breast cancer COPD and chronic kidney disease.  Patient presents to the emergency department of shortness of breath and has been admitted for Pleural effusion [J90] Status post thoracentesis [Z98.890] CHF exacerbation (HCC) [I50.9] Pleural effusion on right [J90] Acute on chronic combined systolic and diastolic CHF (congestive heart failure) (HCC) [I50.43] Acute on chronic respiratory failure with hypoxia ([place]) [J96.21]  Patient is known to our practice and is followed outpatient by Dr. Holley Raring.  She was last seen in office on April 19, 2022.     Patient was seen today on second floor. Patient informed me that she was feeling better than yesterday todayI again discussed the patient  kidney related issues to the best of my ability I discussed with the patient regarding her poor prognosis.  Patient informed me that she has still not decided for hospice/palliative care and wishes for aggressive treatment   Objective:  Vital signs in last 24 hours:  Temp:  [97.6 F (36.4 C)-97.9 F (36.6 C)] 97.9 F (36.6 C) (10/18 0427) Pulse Rate:  [81-111] 94 (10/18 0429) Resp:  [16-24] 20 (10/18 0427) BP: (91-117)/(69-93) 91/72 (10/18 0427) SpO2:  [89 %-94 %] 91 % (10/18 0427) Weight:  [53.6 kg] 53.6 kg (10/18 0500)  Weight change:  Filed Weights   07/27/22 0449 07/28/22 0419 08/01/22 0500  Weight: 51.2 kg 47.9 kg 53.6 kg    Intake/Output: I/O last 3 completed shifts: In: -  Out: 700 [Urine:700]   Intake/Output this shift:  No intake/output data recorded.  Physical Exam: General: Sitting side of bed  Head: Normocephalic, atraumatic. Moist oral mucosal membranes  Eyes: Anicteric  Lungs:  Mild respiratory distress,Decreased breath sounds at bases,   Heart: Irregular rate and rhythm  Abdomen:  Soft, tender, nondistended   Extremities: No peripheral edema.  Neurologic: Nonfocal, moving all four extremities  Skin: No lesions  Access: None    Basic Metabolic Panel: Recent Labs  Lab 07/26/22 0409 07/27/22 0710 07/28/22 1410 07/29/22 0416 07/30/22 0442 07/31/22 0425 08/01/22 0440  NA 141   < > 140 142 143 141 139  K 4.3   < > 4.1 4.4 4.1 3.9 4.2  CL 110   < > 102 105 105 103 101  CO2 26   < > 27 27 28 27 26   GLUCOSE 97   < > 168* 101* 92 119* 95  BUN 46*   < > 48* 57* 61* 63* 70*  CREATININE 2.32*   < > 2.34* 2.45* 2.36* 2.48* 2.99*  CALCIUM 8.5*   < > 9.0 8.9 9.0 9.1 8.9  PHOS 6.0*  --   --   --   --   --   --    < > = values in this interval not displayed.    Liver Function Tests: Recent Labs  Lab 07/26/22 0409  ALBUMIN 3.1*   No results for input(s): "LIPASE", "AMYLASE" in the last 168 hours. No results for input(s): "AMMONIA" in the last 168 hours.  CBC: Recent Labs  Lab 07/28/22 1410 07/31/22 0425  WBC 6.6 5.5  NEUTROABS 4.7  --   HGB 10.4* 9.4*  HCT 32.8* 29.5*  MCV 97.9 97.4  PLT 230 223    Cardiac Enzymes: No results for input(s): "CKTOTAL", "CKMB", "CKMBINDEX", "TROPONINI" in the last 168 hours.  BNP: Invalid input(s): "POCBNP"  CBG: No results for input(s): "GLUCAP" in  the last 168 hours.  Microbiology: Results for orders placed or performed during the hospital encounter of 08/13/2022  Resp Panel by RT-PCR (Flu A&B, Covid) Anterior Nasal Swab     Status: None   Collection Time: 07/25/2022  6:43 PM   Specimen: Anterior Nasal Swab  Result Value Ref Range Status   SARS Coronavirus 2 by RT PCR NEGATIVE NEGATIVE Final    Comment: (NOTE) SARS-CoV-2 target nucleic acids are NOT DETECTED.  The SARS-CoV-2 RNA is generally detectable in upper respiratory specimens during the acute phase of infection. The lowest concentration of SARS-CoV-2 viral copies this assay can detect is 138 copies/mL. A negative result does not preclude SARS-Cov-2 infection and should not be used  as the sole basis for treatment or other patient management decisions. A negative result may occur with  improper specimen collection/handling, submission of specimen other than nasopharyngeal swab, presence of viral mutation(s) within the areas targeted by this assay, and inadequate number of viral copies(<138 copies/mL). A negative result must be combined with clinical observations, patient history, and epidemiological information. The expected result is Negative.  Fact Sheet for Patients:  EntrepreneurPulse.com.au  Fact Sheet for Healthcare Providers:  IncredibleEmployment.be  This test is no t yet approved or cleared by the [place] FDA and  has been authorized for detection and/or diagnosis of SARS-CoV-2 by FDA under an Emergency Use Authorization (EUA). This EUA will remain  in effect (meaning this test can be used) for the duration of the COVID-19 declaration under Section 564(b)(1) of the Act, 21 U.S.C.section 360bbb-3(b)(1), unless the authorization is terminated  or revoked sooner.       Influenza A by PCR NEGATIVE NEGATIVE Final   Influenza B by PCR NEGATIVE NEGATIVE Final    Comment: (NOTE) The Xpert Xpress SARS-CoV-2/FLU/RSV plus assay is intended as an aid in the diagnosis of influenza from Nasopharyngeal swab specimens and should not be used as a sole basis for treatment. Nasal washings and aspirates are unacceptable for Xpert Xpress SARS-CoV-2/FLU/RSV testing.  Fact Sheet for Patients: EntrepreneurPulse.com.au  Fact Sheet for Healthcare Providers: IncredibleEmployment.be  This test is not yet approved or cleared by the [place] FDA and has been authorized for detection and/or diagnosis of SARS-CoV-2 by FDA under an Emergency Use Authorization (EUA). This EUA will remain in effect (meaning this test can be used) for the duration of the COVID-19 declaration under Section 564(b)(1)  of the Act, 21 U.S.C. section 360bbb-3(b)(1), unless the authorization is terminated or revoked.  Performed at [facility], [address] [place],  [address]     Coagulation Studies: No results for input(s): "LABPROT", "INR" in the last 72 hours.  Urinalysis: No results for input(s): "COLORURINE", "LABSPEC", "PHURINE", "GLUCOSEU", "HGBUR", "BILIRUBINUR", "KETONESUR", "PROTEINUR", "UROBILINOGEN", "NITRITE", "LEUKOCYTESUR" in the last 72 hours.  Invalid input(s): "APPERANCEUR"    Imaging: DG Chest Port 1 View  Result Date: 07/30/2022 CLINICAL DATA:  Shortness of breath. Pleural effusion. EXAM: PORTABLE CHEST 1 VIEW COMPARISON:  07/29/2022 FINDINGS: Stable enlarged cardiac silhouette, right pleural effusion, mildly prominent interstitial markings, right upper lung zone linear atelectasis or scarring and ill-defined opacity in the right lower lung zone. IMPRESSION: 1. Stable cardiomegaly and mild interstitial lung disease with possible superimposed mild interstitial pulmonary edema. 2. Stable right pleural effusion. 3. Stable right upper lung zone linear atelectasis or scarring and increased density in the right lower lung zone due to the pleural effusion with possible atelectasis or pneumonia. Electronically Signed   By: Percell Locus.D.  On: 07/30/2022 11:41     Medications:      amiodarone  200 mg Oral Daily   apixaban  2.5 mg Oral BID   atorvastatin  40 mg Oral Daily   citalopram  20 mg Oral Daily   feeding supplement  1 Container Oral TID BM   metoprolol tartrate  12.5 mg Oral BID   multivitamin with minerals  1 tablet Oral Daily   pantoprazole  40 mg Oral BID AC   polyethylene glycol  17 g Oral Daily   sodium bicarbonate  1,300 mg Oral Daily   sodium chloride flush  3 mL Intravenous Q12H   spironolactone  12.5 mg Oral Daily   torsemide  40 mg Oral Daily   acetaminophen **OR** acetaminophen, calcium carbonate, guaiFENesin, HYDROcodone-acetaminophen,  melatonin, simethicone  Assessment/ Plan:  Ms. VANIYAH LANSKY is a 66 y.o.  female with past medical conditions including diabetes, hypertension, lymphedema, breast cancer COPD and chronic kidney disease.  Patient presents to the emergency department of shortness of breath and has been admitted for Pleural effusion [J90] Status post thoracentesis [Z98.890] CHF exacerbation ([place]) [I50.9] Pleural effusion on right [J90] Acute on chronic combined systolic and diastolic CHF (congestive heart failure) (HCC) [I50.43] Acute on chronic respiratory failure with hypoxia (HCC) [J96.21]   Acute Kidney Injury on chronic kidney disease stage IIIb with baseline creatinine 1.50 and GFR of 38 on 06/16/22.  Acute kidney injury secondary to cardiorenal syndrome and diuretic therapy Chronic kidney disease is secondary to hypertension and CHF.  No IV contrast exposure.   Patient AKI is worsening Patient creatinine was earlier stable at 2.3--2.5 has now increased to 2.9   Educated patient about worsening of her kidney disease. We will reduce patient's diuretics  As patient's creatinine is worsening.   There was a question about patient volume status. Patient developed shortness of breath between October 16/17th night. Patient was given IV Lasix I increased patient torsemide thereafter Patient shortness of breath did improve Patient creatinine started trending up. I then reduced patient's diuretics. I then discussed the case with patient's primary team and the pulmonary team and patient primary team then asked for CT scan chest.    CT chest done on August 01, 2022 IMPRESSION: 1. Persistent and slightly larger right pleural effusion with overlying atelectasis. 2. Stable probable post treatment changes involving the right upper lobe with dense areas of masslike fibrotic changes. 3. Stable severe emphysematous changes and pulmonary scarring. 4. 8 mm superior segment left lower lobe pulmonary nodule  enlarged since May of 2023 and worrisome for a metastatic focus. 5. Stable advanced atherosclerotic calcification involving the aorta and coronary arteries. 6. Aortic atherosclerosis.  Patient CT chest results were reviewed.  It showed the patient's pleural effusion has worsened. Patient primary team is following for the need of Pleurx   Lab Results  Component Value Date   CREATININE 2.99 (H) 08/01/2022   CREATININE 2.48 (H) 07/31/2022   CREATININE 2.36 (H) 07/30/2022    Intake/Output Summary (Last 24 hours) at 08/01/2022 0748 Last data filed at 07/31/2022 2115 Gross per 24 hour  Intake --  Output 200 ml  Net -200 ml    2. Anemia of chronic kidney disease Lab Results  Component Value Date   HGB 9.4 (L) 07/31/2022    Hemoglobin at goal  3.  Hypertension with chronic kidney disease.  Home regimen includes torsemide, amiodarone, hydralazine, and isosorbide. Continue Midodrine 5mg  three times daily for BP support.   Patient is currently on  torsemide  4.  Pleural effusions, recurrent.   Patient is being followed by pulmonary Patient pleural effusion is worsening Primary team is following for possible need of Pleurx     5.Chronic Combined systolic and diastolic CHF Patient CT scan does not show an increasing intravascular condition but rather increasing pleural effusion We will continue the lower dose of torsemide for now  6.Social Patient has poor prognosis because of multiorgan disease process.  Patient is being followed by primary team and palliative care team has been consulted as well      LOS: 14 Klover Priestly s Adhrit Krenz 10/18/20237:48 AM

## 2022-08-01 NOTE — Progress Notes (Signed)
Initiated Heated Hi Flow due to persistent de-saturation even post Neb Tx. PT started on 45L and 45%, with SpO2 improved to 96%

## 2022-08-01 NOTE — Progress Notes (Signed)
PULMONOLOGY         Date: 08/01/2022,   MRN# [id number] Danielle [name] Aug 18, 1956     AdmissionWeight: 50.3 kg                 CurrentWeight: 53.6 kg  Referring provider: Dr Arbutus Ped   CHIEF COMPLAINT:   Recurrent pleural effusions and advanced metastatic cancer.    HISTORY OF PRESENT ILLNESS   This is a very pleasant 66yo F with hx of advanced COPD, chronic anemia, paroxysmal AFchronic anticoagulation therapy, history of chronic systolic heart failure, history of adenocarcinoma of the right lung status post radiation therapy, history of stage IIIb chronic kidney disease, aortic valve stenosis, COPD with chronic respiratory failure on 4 L of oxygen continuous, history of left breast cancer status post mastectomy, history of esophageal cancer recurrent right-sided pleural effusion requiring thoracentesis Most recently on 06/19/2022 who presents to the ER for evaluation of worsening shortness of breath from her baseline.  She has associated chest tightness. She denies having mucus on expectoration, she is on 3-4L/min during my evaluation and notes when attempting to ambulate she has dyspnea.   07/24/22- patient is mildly imprved with bronchopulmonary hygiene .  Continue current care and may initiate DC planning.   07/25/22- no acute events overnight continue current care plan.  She is close to baseline.   07/26/22- patient reports slight worsening and is now on 7L/min.    07/27/22-  patient still not improved with cxr showing ongoing edema and effusions. I have stopped her metoprolol and midodrine due to drug/drug interaction and have ordered torsemide to diurese her despite CKD.  She is overall chronically ill with severe comorbid conditions. She has palliative care consultation ordered but remains full code which seems too aggressive for her current state of health.   07/28/22- patient is on 13L/min this am which is improved from yesterday.  She is on torsemide now and seems  to be putting out more fluid.  She started to expectorate some mucus this am. Will order aldactone 12.5 in additon to torsemide.  07/29/22- patient mildly improved will continue diuresis wit fluid restricted diet  07/30/22- despite more aggressive diuresis patient has not improved and continues to require increased O2 via [place].  CXR with small amount of edema.  There is masslike consolidation and layering effusion.  CKD is no better and she may need thoracentesis or dialysis for fluid management.    07/31/22- patient is minimally improved despite aggressive medical management.  I recommend palliative care evaluation or hospice.  08/01/22- patient is not agreeable to hospice and CT shows mod pleural Right effusion , may perform pleureX drainage for chronic recurrent effusion.   PAST MEDICAL HISTORY   Past Medical History:  Diagnosis Date   Acute respiratory failure due to COVID-19 ([place]) 09/27/2019   Anemia    Anxiety    Aortic atherosclerosis ([place])    Aortic valve stenosis 02/10/2018   a.) TTE 02/10/2018: EF 55-60%: mild AS with MPG of 12 mmHg. b.) TTE 04/21/2018: EF 35-40%; mild AS with MPG 8 mmHg. c.) TTE 11/07/2019: EF 50-55%; mild AS with MPG 10 mmHg.   Arthritis    Atrial fibrillation (HCC)    a.) CHA2DS2-VASc = 4 (age, sex, HTN, aortic plaque). b.) Rate/rhythm maintained on oral carvedilol; chronically anticoagulated on dose reduced apixaban. c.)  Attempted deployment of LAA occlusive device on 01/11/2021; parameters failed and procedure aborted.   Breast cancer, left ([place]) 2000   a.) T2N1M0; ER/PR (+) -->  Tx'd with total mastectomy, LN resection, XRT, and chemotherapy   Cancer of right lung ([place]) 07/30/2016   a.) adenocarcinoma; ALK, ROS1, PDL1, BRAF, EGFR all negative.   CHF (congestive heart failure) (HCC)    Chronic diastolic CHF (congestive heart failure) ([place]) 11/26/2021   CKD (chronic kidney disease), stage IV (HCC)    COPD (chronic obstructive pulmonary disease) (HCC)     Dependence on supplemental oxygen    Depression    Diastolic dysfunction 63/89/3734   a.) TTE 02/10/2017: EF 55-60%; G2DD. b.) TTE 04/21/2018: EF 35-40%; mild LA dilation; mod MV regurgitation. c.) TTE 11/07/2019: EF 50-55%; G1DD.   DOE (dyspnea on exertion)    GERD (gastroesophageal reflux disease)    Heart murmur    History of 2019 novel coronavirus disease (COVID-19) 10/14/2019   HLD (hyperlipidemia)    Hypertension    Long term current use of anticoagulant    a.) apixaban   Lymphedema    Personal history of chemotherapy    Personal history of radiation therapy    Respiratory tract infection due to COVID-19 virus 09/27/2019   SBO (small bowel obstruction) ([place]) 11/05/2020   Vitamin D deficiency      SURGICAL HISTORY   Past Surgical History:  Procedure Laterality Date   Breast Biospy Left    [facility]   BREAST SURGERY     CARDIOVERSION N/A 02/23/2022   Procedure: CARDIOVERSION;  Surgeon: Corey Skains, MD;  Location: [facility] ORS;  Service: Cardiovascular;  Laterality: N/A;   COLONOSCOPY N/A 04/30/2018   Procedure: COLONOSCOPY;  Surgeon: Virgel Manifold, MD;  Location: [facility] ENDOSCOPY;  Service: Endoscopy;  Laterality: N/A;   COLONOSCOPY N/A 07/22/2018   Procedure: COLONOSCOPY;  Surgeon: Virgel Manifold, MD;  Location: [facility] ENDOSCOPY;  Service: Endoscopy;  Laterality: N/A;   COLONOSCOPY WITH PROPOFOL N/A 09/21/2021   Procedure: COLONOSCOPY WITH PROPOFOL;  Surgeon: Benjamine Sprague, DO;  Location: [place] ENDOSCOPY;  Service: General;  Laterality: N/A;   COLONOSCOPY WITH PROPOFOL N/A 03/11/2022   Procedure: COLONOSCOPY WITH PROPOFOL;  Surgeon: Annamaria Helling, DO;  Location: [facility] ENDOSCOPY;  Service: Gastroenterology;  Laterality: N/A;   DILATION AND CURETTAGE OF UTERUS     ELECTROMAGNETIC NAVIGATION BROCHOSCOPY Right 04/11/2016   Procedure: ELECTROMAGNETIC NAVIGATION BRONCHOSCOPY;  Surgeon: Vilinda Boehringer, MD;  Location: [facility] ORS;  Service: Cardiopulmonary;  Laterality:  Right;   ESOPHAGOGASTRODUODENOSCOPY N/A 07/22/2018   Procedure: ESOPHAGOGASTRODUODENOSCOPY (EGD);  Surgeon: Virgel Manifold, MD;  Location: [facility] ENDOSCOPY;  Service: Endoscopy;  Laterality: N/A;   ESOPHAGOGASTRODUODENOSCOPY (EGD) WITH PROPOFOL N/A 05/07/2018   Procedure: ESOPHAGOGASTRODUODENOSCOPY (EGD) WITH PROPOFOL;  Surgeon: Lucilla Lame, MD;  Location: [facility] ENDOSCOPY;  Service: Endoscopy;  Laterality: N/A;   ESOPHAGOGASTRODUODENOSCOPY (EGD) WITH PROPOFOL N/A 04/24/2019   Procedure: ESOPHAGOGASTRODUODENOSCOPY (EGD) WITH PROPOFOL;  Surgeon: Jonathon Bellows, MD;  Location[facility] ENDOSCOPY;  Service: Gastroenterology;  Laterality: N/A;   ESOPHAGOGASTRODUODENOSCOPY (EGD) WITH PROPOFOL N/A 01/12/2020   Procedure: ESOPHAGOGASTRODUODENOSCOPY (EGD) WITH PROPOFOL;  Surgeon: Jonathon Bellows, MD;  Location: [facility] ENDOSCOPY;  Service: Gastroenterology;  Laterality: N/A;   ESOPHAGOGASTRODUODENOSCOPY (EGD) WITH PROPOFOL N/A 04/28/2020   Procedure: ESOPHAGOGASTRODUODENOSCOPY (EGD) WITH PROPOFOL;  Surgeon: Jonathon Bellows, MD;  Location: [facility] ENDOSCOPY;  Service: Gastroenterology;  Laterality: N/A;   ESOPHAGOGASTRODUODENOSCOPY (EGD) WITH PROPOFOL N/A 03/11/2022   Procedure: ESOPHAGOGASTRODUODENOSCOPY (EGD) WITH PROPOFOL;  Surgeon: Annamaria Helling, DO;  Location: [facility] ENDOSCOPY;  Service: Gastroenterology;  Laterality: N/A;   EUS N/A 05/07/2019   Procedure: FULL UPPER ENDOSCOPIC ULTRASOUND (EUS) RADIAL;  Surgeon: Jola Schmidt, MD;  Location: [facility]  ENDOSCOPY;  Service: Endoscopy;  Laterality: N/A;   ILEOSCOPY N/A 07/22/2018   Procedure: ILEOSCOPY THROUGH STOMA;  Surgeon: Virgel Manifold, MD;  Location: [facility] ENDOSCOPY;  Service: Endoscopy;  Laterality: N/A;   ILEOSTOMY     ILEOSTOMY N/A 09/08/2018   Procedure: ILEOSTOMY REVISION POSSIBLE CREATION;  Surgeon: Herbert Pun, MD;  Location: [facility] ORS;  Service: General;  Laterality: N/A;   ILEOSTOMY CLOSURE N/A 08/15/2018   Procedure: DILATION OF ILEOSTOMY  STRICTURE;  Surgeon: Herbert Pun, MD;  Location: [facility] ORS;  Service: General;  Laterality: N/A;   LAPAROTOMY Right 05/04/2018   Procedure: EXPLORATORY LAPAROTOMY right colectomy right and left ostomy;  Surgeon: Herbert Pun, MD;  Location: [facility] ORS;  Service: General;  Laterality: Right;   LEFT ATRIAL APPENDAGE OCCLUSION N/A 01/11/2021   Procedure: LEFT ATRIAL APPENDAGE OCCLUSION ([place]); ABORTED PROCEDURE WITHOUT DEVICE BEING IMPLANTED; Location: [facility]; Surgeon: Mylinda Latina, MD   LUNG BIOPSY     MASTECTOMY Left    2000, [place] Right    [place]   XI ROBOTIC ASSISTED COLOSTOMY TAKEDOWN N/A 10/23/2021   Procedure: XI ROBOTIC ASSISTED ILEOSTOMY TAKEDOWN;  Surgeon: Herbert Pun, MD;  Location: [facility] ORS;  Service: General;  Laterality: N/A;  180 minutes for the surgery part please     FAMILY HISTORY   Family History  Problem Relation Age of Onset   Breast cancer Mother 13   Cancer Mother        Breast    Cirrhosis Father    Breast cancer Paternal Aunt 54   Cancer Maternal Aunt        Breast      SOCIAL HISTORY   Social History   Tobacco Use   Smoking status: Former    Packs/day: 0.50    Years: 20.00    Total pack years: 10.00    Types: Cigarettes    Quit date: 07/02/2012    Years since quitting: 10.0   Smokeless tobacco: Current    Types: Snuff  Vaping Use   Vaping Use: Never used  Substance Use Topics   Alcohol use: Yes    Alcohol/week: 3.0 standard drinks of alcohol    Types: 3 Cans of beer per week    Comment: Occasionally beer   Drug use: No     MEDICATIONS    Home Medication:    Current Medication:  Current Facility-Administered Medications:    acetaminophen (TYLENOL) tablet 650 mg, 650 mg, Oral, Q6H PRN, 650 mg at 07/31/22 1644 **OR** acetaminophen (TYLENOL) suppository 650 mg, 650 mg, Rectal, Q6H PRN, Athena Masse, MD   amiodarone (PACERONE) tablet 200 mg, 200 mg, Oral, Daily, Judd Gaudier V,  MD, 200 mg at 08/01/22 0815   apixaban (ELIQUIS) tablet 2.5 mg, 2.5 mg, Oral, BID, Judd Gaudier V, MD, 2.5 mg at 08/01/22 0816   atorvastatin (LIPITOR) tablet 40 mg, 40 mg, Oral, Daily, Judd Gaudier V, MD, 40 mg at 08/01/22 0815   calcium carbonate (TUMS - dosed in mg elemental calcium) chewable tablet 200 mg of elemental calcium, 1 tablet, Oral, TID PRN, Nicole Kindred A, DO, 200 mg of elemental calcium at 07/24/22 2203   citalopram (CELEXA) tablet 20 mg, 20 mg, Oral, Daily, Nicole Kindred A, DO, 20 mg at 08/01/22 0815   feeding supplement (BOOST / RESOURCE BREEZE) liquid 1 Container, 1 Container, Oral, TID BM, Nicole Kindred A, DO, 1 Container at 08/01/22 0818   guaiFENesin (MUCINEX) 12 hr tablet 1,200 mg, 1,200 mg, Oral, BID PRN,  Wouk, Ailene Rud, MD, 1,200 mg at 08/01/22 9390   HYDROcodone-acetaminophen (NORCO/VICODIN) 5-325 MG per tablet 1-2 tablet, 1-2 tablet, Oral, Q4H PRN, Athena Masse, MD, 2 tablet at 07/31/22 2044   melatonin tablet 5 mg, 5 mg, Oral, QHS PRN, Sharion Settler, NP, 5 mg at 07/31/22 2043   metoprolol tartrate (LOPRESSOR) tablet 12.5 mg, 12.5 mg, Oral, BID, Lanney Gins, Kenidi Elenbaas, MD, 12.5 mg at 08/01/22 3009   multivitamin with minerals tablet 1 tablet, 1 tablet, Oral, Daily, Nicole Kindred A, DO, 1 tablet at 08/01/22 0814   pantoprazole (PROTONIX) EC tablet 40 mg, 40 mg, Oral, BID AC, Judd Gaudier V, MD, 40 mg at 08/01/22 0816   polyethylene glycol (MIRALAX / GLYCOLAX) packet 17 g, 17 g, Oral, Daily, Breeze, Shantelle, NP, 17 g at 07/31/22 0928   simethicone (MYLICON) 40 QZ/3.0QT suspension 40 mg, 40 mg, Oral, QID PRN, Oswald Hillock, RPH, 40 mg at 07/30/22 0932   sodium bicarbonate tablet 1,300 mg, 1,300 mg, Oral, Daily, Judd Gaudier V, MD, 1,300 mg at 08/01/22 0816   sodium chloride flush (NS) 0.9 % injection 3 mL, 3 mL, Intravenous, Q12H, Nicole Kindred A, DO, 3 mL at 08/01/22 0818   spironolactone (ALDACTONE) tablet 12.5 mg, 12.5 mg, Oral, Daily, Lanney Gins, Selby Slovacek,  MD, 12.5 mg at 08/01/22 0814   torsemide (DEMADEX) tablet 20 mg, 20 mg, Oral, Daily, Bhutani, Manpreet S, MD, 20 mg at 08/01/22 0815    ALLERGIES   Patient has no known allergies.     REVIEW OF SYSTEMS    Review of Systems:  Gen:  Denies  fever, sweats, chills weigh loss  HEENT: Denies blurred vision, double vision, ear pain, eye pain, hearing loss, nose bleeds, sore throat Cardiac:  No dizziness, chest pain or heaviness, chest tightness,edema Resp:   reports dyspnea chronically  Gi: Denies swallowing difficulty, stomach pain, nausea or vomiting, diarrhea, constipation, bowel incontinence Gu:  Denies bladder incontinence, burning urine Ext:   Denies Joint pain, stiffness or swelling Skin: Denies  skin rash, easy bruising or bleeding or hives Endoc:  Denies polyuria, polydipsia , polyphagia or weight change Psych:   Denies depression, insomnia or hallucinations   Other:  All other systems negative   VS: BP 99/84 (BP Location: Right Arm)   Pulse (!) 111   Temp (!) 97.4 F (36.3 C)   Resp 18   Ht _0  (1.676 m)   Wt 53.6 kg   SpO2 92%   BMI 19.07 kg/m      PHYSICAL EXAM    GENERAL:NAD, no fevers, chills, no weakness no fatigue HEAD: Normocephalic, atraumatic.  EYES: Pupils equal, round, reactive to light. Extraocular muscles intact. No scleral icterus.  MOUTH: Moist mucosal membrane. Dentition intact. No abscess noted.  EAR, NOSE, THROAT: Clear without exudates. No external lesions.  NECK: Supple. No thyromegaly. No nodules. No JVD.  PULMONARY: decreased breath sounds with mild rhonchi worse at bases bilaterally.  CARDIOVASCULAR: S1 and S2. Regular rate and rhythm. No murmurs, rubs, or gallops. No edema. Pedal pulses 2+ bilaterally.  GASTROINTESTINAL: Soft, nontender, nondistended. No masses. Positive bowel sounds. No hepatosplenomegaly.  MUSCULOSKELETAL: No swelling, clubbing, or edema. Range of motion full in all extremities.  NEUROLOGIC: Cranial nerves II  through XII are intact. No gross focal neurological deficits. Sensation intact. Reflexes intact.  SKIN: No ulceration, lesions, rashes, or cyanosis. Skin warm and dry. Turgor intact.  PSYCHIATRIC: Mood, affect within normal limits. The patient is awake, alert and oriented x 3. Insight, judgment intact.  IMAGING     ASSESSMENT/PLAN   Acute on chronic hypoxemic respiratory failure -patient with advanced COPD/CHF/CKD/lung cancer -she is with a least moderate severity physical deconditioning -all of these comorbidities are currently playing role in dyspnea -Advanced COPD - continue ICS/LABA/LAMA with Breo Ellipta and Incruse and addition of PRN albuterol nebulizer  -CHF/CKD overlap- adding aldactone 12.5 and reducing metoprolol from 25 to 12.5 bid to allow hemodynamic leveling with midodrine and Atrial fibrillation while diuresing 3. Atelectasis - worse at RLL - Metaneb with saline q8h 4. Right pleural effusion -  adding more diuresis, holding thoracentesis due to high likelyhood of rapid re-accumulation 5. Physical deconditioning- patient is currently with PT/OT and would benefit from same on outpatient 6. Atrial fibrillation - chronic stable - continue eliquis, metoprolol, amiodarone          Thank you for allowing me to participate in the care of this patient.   Patient/Family are satisfied with care plan and all questions have been answered.    Provider disclosure: Patient with at least one acute or chronic illness or injury that poses a threat to life or bodily function and is being managed actively during this encounter.  All of the below services have been performed independently by signing provider:  review of prior documentation from internal and or external health records.  Review of previous and current lab results.  Interview and comprehensive assessment during patient visit today. Review of current and previous chest radiographs/CT scans. Discussion of management and  test interpretation with health care team and patient/family.   This document was prepared using Dragon voice recognition software and may include unintentional dictation errors.     Ottie Glazier, M.D.  Division of Pulmonary & Critical Care Medicine

## 2022-08-01 NOTE — Progress Notes (Signed)
Progress Note   Patient: Danielle [name] ZGY:[id number] DOB: 1955-11-06 DOA: 07/21/2022     14 DOS: the patient was seen and examined on 08/01/2022   Brief hospital course: Danielle [name] is a 66 y.o. female with medical history significant for Paroxysmal A-fib on chronic anticoagulation therapy, history of chronic systolic heart failure, history of adenocarcinoma of the right lung status post radiation therapy, history of stage IIIb chronic kidney disease, aortic valve stenosis, COPD with chronic respiratory failure on 4 L of oxygen continuous, history of left breast cancer status post mastectomy, history of esophageal cancer recurrent right-sided pleural effusion requiring thoracentesis, most recently on 06/19/2022 who presented to the ER on 08/12/2022 for evaluation of worsening shortness of breath with associated chest tightness.    ED course and evaluation --- BP 135/99 with RR 29 with O2 sat 90% on increased O2 flow rate of 5 L/min.  Labs with troponin 15>>17, BNP 2935.  Treated with IV Lasix in the ED.  Chest x-ray showed -- Small right pleural effusion, increased since prior study.  Increasing right basilar consolidation, which may reflect airspace disease or progressive atelectasis.  Mass-like linear consolidation right suprahilar region, unchanged since recent CT.  Patient was admitted to hospitalist service and IV diuresis was continued.    IR was consulted for thoracentesis which was done on 10/5 with 900 cc fluid removed.   Hospital course complicated by: --Acute kidney injury after diuresis due to cardiorenal physiology.  Nephrology was consulted.   Renal function improved with diuretics held, but pt with persistent and worsening exertional dyspnea again. --Abdominal pain for which evaluation including abdominal xray and CT abdomen/pelvis was unremarkable. --Swallowing difficulties with severe esophageal dysmotility discovered on barium esophagram.      Assessment and Plan: Acute  on chronic combined systolic and diastolic CHF (congestive heart failure) (HCC) Acute on chronic respiratory failure with hypoxia (HCC) Echocardiogram performed on 01/2022 showed ejection from 35 to 40%. Patient initially received IV Lasix, changed to high-dose torsemide yesterday.  Reduced dose to 20 mg daily today per nephrology. Patient still has significant short of breath with minimal exertion, will obtain CT chest per recommendation from nephrology and pulmonology.  We will decide if more diuretics needed. Patient still on 5 L oxygen.  Acute kidney injury on chronic kidney disease stage IIIb. Renal function is getting worse with diuretics.  Appreciate nephrology consult.  Transient hypotension No recurrence.   Malignant neoplasm of upper lobe of right lung Malignant neoplasm of lower third of esophagus Carcinoma of overlapping sites of left breast in female, estrogen receptor positive [facility]) Follow-up with oncology as outpatient.  Recurrent right pleural effusion. Last thoracentesis was 10/5 with 900 mm fluid removed.  Reviewed her previous cytology results from thoracentesis, no malignancy identified. May consider repeat thoracentesis versus pleural catheter if this reaccumulated.  Dysphagia secondary to esophageal dysmotility  Followed by speech therapy.  Prolonged QT interval. Avoid medication that can further prolong QT interval, monitor electrolytes.  Paroxysmal atrial fibrillation. Continue metoprolol  COPD  Appears to be stable, no bronchospasm.  Goals of care discussion. Patient is aware of the severity of her condition, but still want full code, still want everything to be done.  Long-term prognosis very poor.       Subjective:  Still has significant short of breath with minimal exertion, cough nonproductive.  Physical Exam: Vitals:   08/01/22 0429 08/01/22 0500 08/01/22 0812 08/01/22 1204  BP:   93/69 99/84  Pulse: 94  94 (!) 111  Resp:   20 18  Temp:    98.3 F (36.8 C) (!) 97.4 F (36.3 C)  TempSrc:   Oral   SpO2:   93% 92%  Weight:  53.6 kg    Height:       General exam: Appears calm and comfortable  Respiratory system: Decreased breathing sounds with crackles in the base. Respiratory effort normal. Cardiovascular system: Irregular. No JVD, murmurs, rubs, gallops or clicks. No pedal edema. Gastrointestinal system: Abdomen is nondistended, soft and nontender. No organomegaly or masses felt. Normal bowel sounds heard. Central nervous system: Alert and oriented. No focal neurological deficits. Extremities: Symmetric 5 x 5 power. Skin: No rashes, lesions or ulcers Psychiatry: Mood & affect appropriate.   Data Reviewed:  Reviewed the CT scan results, pathology results, lab results.  Family Communication: daughter updated  Disposition: Status is: Inpatient Remains inpatient appropriate because: Severity of disease, condition not much improved yet.  Planned Discharge Destination: Skilled nursing facility    Time spent: 55 minutes  Author: Sharen Hones, MD 08/01/2022 2:56 PM  For on call review [email or web address].

## 2022-08-02 ENCOUNTER — Inpatient Hospital Stay: Payer: Medicare Other

## 2022-08-02 DIAGNOSIS — I5043 Acute on chronic combined systolic (congestive) and diastolic (congestive) heart failure: Secondary | ICD-10-CM | POA: Diagnosis not present

## 2022-08-02 DIAGNOSIS — I071 Rheumatic tricuspid insufficiency: Secondary | ICD-10-CM

## 2022-08-02 DIAGNOSIS — I350 Nonrheumatic aortic (valve) stenosis: Secondary | ICD-10-CM

## 2022-08-02 DIAGNOSIS — I480 Paroxysmal atrial fibrillation: Secondary | ICD-10-CM | POA: Diagnosis not present

## 2022-08-02 DIAGNOSIS — J9621 Acute and chronic respiratory failure with hypoxia: Secondary | ICD-10-CM | POA: Diagnosis not present

## 2022-08-02 LAB — GLUCOSE, CAPILLARY: Glucose-Capillary: 120 mg/dL — ABNORMAL HIGH (ref 70–99)

## 2022-08-02 LAB — BASIC METABOLIC PANEL
Anion gap: 9 (ref 5–15)
BUN: 76 mg/dL — ABNORMAL HIGH (ref 8–23)
CO2: 27 mmol/L (ref 22–32)
Calcium: 8.9 mg/dL (ref 8.9–10.3)
Chloride: 103 mmol/L (ref 98–111)
Creatinine, Ser: 2.77 mg/dL — ABNORMAL HIGH (ref 0.44–1.00)
GFR, Estimated: 18 mL/min — ABNORMAL LOW (ref >60)
Glucose, Bld: 96 mg/dL (ref 70–99)
Potassium: 3.6 mmol/L (ref 3.5–5.1)
Sodium: 139 mmol/L (ref 135–145)

## 2022-08-02 LAB — ACID FAST CULTURE WITH REFLEXED SENSITIVITIES (MYCOBACTERIA): Acid Fast Culture: NEGATIVE

## 2022-08-02 LAB — MAGNESIUM: Magnesium: 2 mg/dL (ref 1.7–2.4)

## 2022-08-02 LAB — MRSA NEXT GEN BY PCR, NASAL: MRSA by PCR Next Gen: NOT DETECTED

## 2022-08-02 LAB — BRAIN NATRIURETIC PEPTIDE: B Natriuretic Peptide: 1997.4 pg/mL — ABNORMAL HIGH (ref 0.0–100.0)

## 2022-08-02 MED ORDER — CHLORHEXIDINE GLUCONATE CLOTH 2 % EX PADS
6.0000 | MEDICATED_PAD | Freq: Every day | CUTANEOUS | Status: DC
  Administered 2022-08-02 – 2022-08-20 (×14): 6 via TOPICAL

## 2022-08-02 MED ORDER — SODIUM CHLORIDE 0.9 % IV SOLN: INTRAVENOUS | Status: DC | PRN

## 2022-08-02 MED ORDER — MILRINONE LACTATE IN DEXTROSE 20-5 MG/100ML-% IV SOLN
0.2500 ug/kg/min | INTRAVENOUS | Status: DC
  Administered 2022-08-02 – 2022-08-16 (×14): 0.25 ug/kg/min via INTRAVENOUS
  Filled 2022-08-02 (×14): qty 100

## 2022-08-02 MED ORDER — FUROSEMIDE 10 MG/ML IJ SOLN
4.0000 mg/h | INTRAVENOUS | Status: DC
  Administered 2022-08-02: 4 mg/h via INTRAVENOUS
  Filled 2022-08-02: qty 20

## 2022-08-02 MED ORDER — APIXABAN 2.5 MG PO TABS
2.5000 mg | ORAL_TABLET | Freq: Two times a day (BID) | ORAL | Status: AC
  Administered 2022-08-02 – 2022-08-03 (×3): 2.5 mg via ORAL
  Filled 2022-08-02 (×3): qty 1

## 2022-08-02 NOTE — Progress Notes (Signed)
Progress Note   Patient: Danielle [name] NLG:[id number] DOB: 10/19/1955 DOA: 07/28/2022     15 DOS: the patient was seen and examined on 08/02/2022   Brief hospital course: Danielle [name] is a 66 y.o. female with medical history significant for Paroxysmal A-fib on chronic anticoagulation therapy, history of chronic systolic heart failure, history of adenocarcinoma of the right lung status post radiation therapy, history of stage IIIb chronic kidney disease, aortic valve stenosis, COPD with chronic respiratory failure on 4 L of oxygen continuous, history of left breast cancer status post mastectomy, history of esophageal cancer recurrent right-sided pleural effusion requiring thoracentesis, most recently on 06/19/2022 who presented to the ER on 08/07/2022 for evaluation of worsening shortness of breath with associated chest tightness.    ED course and evaluation --- BP 135/99 with RR 29 with O2 sat 90% on increased O2 flow rate of 5 L/min.  Labs with troponin 15>>17, BNP 2935.  Treated with IV Lasix in the ED.  Chest x-ray showed -- Small right pleural effusion, increased since prior study.  Increasing right basilar consolidation, which may reflect airspace disease or progressive atelectasis.  Mass-like linear consolidation right suprahilar region, unchanged since recent CT.  Patient was admitted to hospitalist service and IV diuresis was continued.    IR was consulted for thoracentesis which was done on 10/5 with 900 cc fluid removed.   Hospital course complicated by: --Acute kidney injury after diuresis due to cardiorenal physiology.  Nephrology was consulted.   Renal function improved with diuretics held, but pt with persistent and worsening exertional dyspnea again. --Abdominal pain for which evaluation including abdominal xray and CT abdomen/pelvis was unremarkable. --Swallowing difficulties with severe esophageal dysmotility discovered on barium esophagram.    10/19.  Patient developed  worsening hypoxemia, currently on high flow oxygen.  We will place a Pleurx catheter as patient pleural effusion has reaccumulated.    Assessment and Plan: Acute on chronic combined systolic and diastolic CHF (congestive heart failure) (HCC) Acute on chronic respiratory failure with hypoxia (HCC) Moderate aortic stenosis, probably severe aortic stenosis due to reduced ejection fraction. Moderate to severe tricuspid regurgitation. Moderate pulm hypertension 2021, probably severe now. Echocardiogram performed on 01/2022 showed ejection from 35 to 40%, also showed moderate aortic stenosis, moderate to severe tricuspid regurgitation.  The echocardiogram performed 2021, patient also had moderate pulm hypertension, she probably had severe pulm hypertension now. Patient initially received IV Lasix, changed to high-dose torsemide,  reduced dose to 20 mg daily 10/18 Patient developed significant respiratory distress on the night of 10/18, was placed on heated high flow at 70% oxygen. Discussed with IR, Pleurx catheter can be placed in 2 days after holding Eliquis. At this time, I will obtain cardiology consult, I will start milrinone as well as IV Lasix.  Transfer to stepdown unit.    Acute kidney injury on chronic kidney disease stage IIIb. Renal function seems to be better after decreased dose of torsemide.   Transient hypotension No recurrence.    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   Recurrent right pleural effusion. Last thoracentesis was 10/5 with 900 mm fluid removed.  Reviewed her previous cytology results from thoracentesis, no malignancy identified. Discussed with pulmonology and IR, Pleurx catheter will be performed 2 days after holding Eliquis.   Dysphagia secondary to esophageal dysmotility   Followed by  speech therapy.   Prolonged QT interval. Avoid medication that can further prolong QT interval, monitor electrolytes.   Paroxysmal atrial fibrillation. Continue metoprolol   COPD  Appears to be stable, no bronchospasm.   Prognosis. Patient condition is terminal, however, patient and family is not willing to go with comfort care.  She still want anything we can offer.      Subjective:  Patient had worsening short of breath last night, was started on heated high flow.  Physical Exam: Vitals:   08/02/22 0807 08/02/22 1030 08/02/22 1143 08/02/22 1200  BP: 94/79  112/89 98/84  Pulse: 97  89 87  Resp: 17  (!) 21 (!) 22  Temp: 98 F (36.7 C)  97.7 F (36.5 C) 97.8 F (36.6 C)  TempSrc:      SpO2: 93% (!) 83% 98%   Weight:      Height:       General exam: Appears calm and comfortable  Respiratory system: Decreased breath sound on right. Respiratory effort normal. Cardiovascular system: S1 & S2 heard, RRR. No JVD, murmurs, rubs, gallops or clicks. No pedal edema. Gastrointestinal system: Abdomen is nondistended, soft and nontender. No organomegaly or masses felt. Normal bowel sounds heard. Central nervous system: Alert and oriented x2. No focal neurological deficits. Extremities: Symmetric 5 x 5 power. Skin: No rashes, lesions or ulcers Psychiatry: Flat affect. Data Reviewed:  Reviewed lab results.  Family Communication: Daughter updated.  Disposition: Status is: Inpatient Remains inpatient appropriate because: Severity of disease, IV treatment.  Planned Discharge Destination:  To be determined.    Time spent: 70 minutes  Author: Sharen Hones, MD 08/02/2022 12:56 PM  For on call review [email or web address].

## 2022-08-02 NOTE — Progress Notes (Signed)
IV Consult placed for difficult stick; IV Team currently not on campus and primary RN made aware.  Spoke with prior RN, Anderson Malta who stated a PICC is being considered.  Advised RN if line is urgently needed, follow up with provider to get access.

## 2022-08-02 NOTE — Progress Notes (Signed)
Brief cardiology note  Impression Respiratory failure Atrial fibrillation paroxysmal Chronic systolic congestive heart failure Adenocarcinoma of right lung status postradiation therapy Chronic renal insufficiency stage III Hypoxemia on 4 L Pleural effusion status postthoracentesis Elevated BNP suggestive of heart failure Hypotension Severe valvular regurgitation Systolic cardiomyopathy EF around 35 to 40%  Plan Consider IR thoracentesis to help with shortness of breath Continue support for blood pressure  management consider adding milrinone Diuretic therapy for volume overload and heart failure Agree with nephrology input for renal insufficiency Agree with ICU level care and management

## 2022-08-02 NOTE — Progress Notes (Signed)
[facility]  ROUNDING NOTE   Subjective:   Danielle [name] 66 year old female with past medical conditions including diabetes, hypertension, lymphedema, breast cancer COPD and chronic kidney disease.  Patient presents to the emergency department of shortness of breath and has been admitted for Pleural effusion [J90] Status post thoracentesis [Z98.890] CHF exacerbation (HCC) [I50.9] Pleural effusion on right [J90] Acute on chronic combined systolic and diastolic CHF (congestive heart failure) (HCC) [I50.43] Acute on chronic respiratory failure with hypoxia ([place]) [J96.21]  Patient is known to our practice and is followed outpatient by Dr. Holley Raring.  She was last seen in office on April 19, 2022.     Patient was seen today on second floor. Patient main concern continues to be that she was short of breath. Patient's overnight events were reviewed. Patient was hypoxic last night and was started on heated high flow by respiratory therapy. Patient continues to be on Heated high flow this morning   Objective:  Vital signs in last 24 hours:  Temp:  [97.4 F (36.3 C)-98.3 F (36.8 C)] 97.8 F (36.6 C) (10/19 0517) Pulse Rate:  [61-115] 95 (10/19 0517) Resp:  [16-22] 20 (10/19 0517) BP: (89-112)/(60-88) 89/73 (10/19 0517) SpO2:  [88 %-98 %] 98 % (10/19 0517) FiO2 (%):  [45 %] 45 % (10/18 2339) Weight:  [54.8 kg] 54.8 kg (10/19 0517)  Weight change: 1.2 kg Filed Weights   07/28/22 0419 08/01/22 0500 08/02/22 0517  Weight: 47.9 kg 53.6 kg 54.8 kg    Intake/Output: I/O last 3 completed shifts: In: 3 [I.V.:3] Out: 1450 [Urine:1450]   Intake/Output this shift:  No intake/output data recorded.  Physical Exam: General: Sitting side of bed  Head: Normocephalic, atraumatic. Moist oral mucosal membranes  Eyes: Anicteric  Lungs:  Moderate respiratory distress,Decreased breath sounds at bases,   Heart: Irregular rate and rhythm  Abdomen:  Soft, tender, nondistended   Extremities: 1+ peripheral edema.  Neurologic: Nonfocal, moving all four extremities  Skin: No lesions  Access: None    Basic Metabolic Panel: Recent Labs  Lab 07/29/22 0416 07/30/22 0442 07/31/22 0425 08/01/22 0440 08/02/22 0356  NA 142 143 141 139 139  K 4.4 4.1 3.9 4.2 3.6  CL 105 105 103 101 103  CO2 27 28 27 26 27   GLUCOSE 101* 92 119* 95 96  BUN 57* 61* 63* 70* 76*  CREATININE 2.45* 2.36* 2.48* 2.99* 2.77*  CALCIUM 8.9 9.0 9.1 8.9 8.9  MG  --   --   --   --  2.0    Liver Function Tests: No results for input(s): "AST", "ALT", "ALKPHOS", "BILITOT", "PROT", "ALBUMIN" in the last 168 hours.  No results for input(s): "LIPASE", "AMYLASE" in the last 168 hours. No results for input(s): "AMMONIA" in the last 168 hours.  CBC: Recent Labs  Lab 07/28/22 1410 07/31/22 0425  WBC 6.6 5.5  NEUTROABS 4.7  --   HGB 10.4* 9.4*  HCT 32.8* 29.5*  MCV 97.9 97.4  PLT 230 223    Cardiac Enzymes: No results for input(s): "CKTOTAL", "CKMB", "CKMBINDEX", "TROPONINI" in the last 168 hours.  BNP: Invalid input(s): "POCBNP"  CBG: No results for input(s): "GLUCAP" in the last 168 hours.  Microbiology: Results for orders placed or performed during the hospital encounter of 07/23/2022  Resp Panel by RT-PCR (Flu A&B, Covid) Anterior Nasal Swab     Status: None   Collection Time: 08/03/2022  6:43 PM   Specimen: Anterior Nasal Swab  Result Value Ref Range Status  SARS Coronavirus 2 by RT PCR NEGATIVE NEGATIVE Final    Comment: (NOTE) SARS-CoV-2 target nucleic acids are NOT DETECTED.  The SARS-CoV-2 RNA is generally detectable in upper respiratory specimens during the acute phase of infection. The lowest concentration of SARS-CoV-2 viral copies this assay can detect is 138 copies/mL. A negative result does not preclude SARS-Cov-2 infection and should not be used as the sole basis for treatment or other patient management decisions. A negative result may occur with  improper  specimen collection/handling, submission of specimen other than nasopharyngeal swab, presence of viral mutation(s) within the areas targeted by this assay, and inadequate number of viral copies(<138 copies/mL). A negative result must be combined with clinical observations, patient history, and epidemiological information. The expected result is Negative.  Fact Sheet for Patients:  EntrepreneurPulse.com.au  Fact Sheet for Healthcare Providers:  IncredibleEmployment.be  This test is no t yet approved or cleared by the [place] FDA and  has been authorized for detection and/or diagnosis of SARS-CoV-2 by FDA under an Emergency Use Authorization (EUA). This EUA will remain  in effect (meaning this test can be used) for the duration of the COVID-19 declaration under Section 564(b)(1) of the Act, 21 U.S.C.section 360bbb-3(b)(1), unless the authorization is terminated  or revoked sooner.       Influenza A by PCR NEGATIVE NEGATIVE Final   Influenza B by PCR NEGATIVE NEGATIVE Final    Comment: (NOTE) The Xpert Xpress SARS-CoV-2/FLU/RSV plus assay is intended as an aid in the diagnosis of influenza from Nasopharyngeal swab specimens and should not be used as a sole basis for treatment. Nasal washings and aspirates are unacceptable for Xpert Xpress SARS-CoV-2/FLU/RSV testing.  Fact Sheet for Patients: EntrepreneurPulse.com.au  Fact Sheet for Healthcare Providers: IncredibleEmployment.be  This test is not yet approved or cleared by the [place] FDA and has been authorized for detection and/or diagnosis of SARS-CoV-2 by FDA under an Emergency Use Authorization (EUA). This EUA will remain in effect (meaning this test can be used) for the duration of the COVID-19 declaration under Section 564(b)(1) of the Act, 21 U.S.C. section 360bbb-3(b)(1), unless the authorization is terminated or revoked.  Performed at  [facility], [address] [place], [place] [address]     Coagulation Studies: No results for input(s): "LABPROT", "INR" in the last 72 hours.  Urinalysis: No results for input(s): "COLORURINE", "LABSPEC", "PHURINE", "GLUCOSEU", "HGBUR", "BILIRUBINUR", "KETONESUR", "PROTEINUR", "UROBILINOGEN", "NITRITE", "LEUKOCYTESUR" in the last 72 hours.  Invalid input(s): "APPERANCEUR"    Imaging: DG Chest Port 1 View  Result Date: 08/02/2022 CLINICAL DATA:  Shortness of breath. EXAM: PORTABLE CHEST 1 VIEW COMPARISON:  Chest radiograph dated 08/01/2022. FINDINGS: Diffuse interstitial prominence may represent edema or developing infiltrate. Similar appearance of right pleural effusion and right lung base atelectasis or infiltrate. There is no pneumothorax. Stable cardiac silhouette. Atherosclerotic calcification of the aorta. No acute osseous pathology. IMPRESSION: Similar appearance of right pleural effusion and right lung base atelectasis or infiltrate. Electronically Signed   By: Anner Crete M.D.   On: 08/02/2022 00:36   CT CHEST WO CONTRAST  Result Date: 08/01/2022 CLINICAL DATA:  Chronic dyspnea. EXAM: CT CHEST WITHOUT CONTRAST TECHNIQUE: Multidetector CT imaging of the chest was performed following the standard protocol without IV contrast. RADIATION DOSE REDUCTION: This exam was performed according to the departmental dose-optimization program which includes automated exposure control, adjustment of the mA and/or kV according to patient size and/or use of iterative reconstruction technique. COMPARISON:  Recent chest CT 07/20/2022 FINDINGS: Cardiovascular:  The heart is within normal limits in size and stable. Stable advanced atherosclerotic calcification involving the aorta and coronary arteries. Mediastinum/Nodes: Stable scattered mediastinal and hilar lymph nodes but no mass or overt adenopathy. The esophagus is grossly normal. Lungs/Pleura: Persistent and slightly larger right  pleural effusion with overlying atelectasis. Stable probable post treatment changes involving the right upper lobe with dense areas of masslike fibrotic changes. Recommend clinical correlation. Stable severe underlying emphysematous changes and pulmonary scarring. Stable 8 mm superior segment left lower lobe pulmonary nodule on image number 66/3. This has increased in size since the prior study from May 2023 and worrisome for a metastatic focus. Upper Abdomen: No significant upper abdominal findings. Stable severe vascular disease. Stable left hepatic lobe cyst. Musculoskeletal: Status post left mastectomy. No axillary adenopathy. The thyroid gland is grossly normal. The bony thorax is intact. IMPRESSION: 1. Persistent and slightly larger right pleural effusion with overlying atelectasis. 2. Stable probable post treatment changes involving the right upper lobe with dense areas of masslike fibrotic changes. 3. Stable severe emphysematous changes and pulmonary scarring. 4. 8 mm superior segment left lower lobe pulmonary nodule enlarged since May of 2023 and worrisome for a metastatic focus. 5. Stable advanced atherosclerotic calcification involving the aorta and coronary arteries. 6. Aortic atherosclerosis. Aortic Atherosclerosis (ICD10-I70.0) and Emphysema (ICD10-J43.9). Electronically Signed   By: Marijo Sanes M.D.   On: 08/01/2022 15:46   DG Chest 2 View  Result Date: 08/01/2022 CLINICAL DATA:  Acute on chronic congestive heart failure. EXAM: CHEST - 2 VIEW COMPARISON:  July 30, 2022. FINDINGS: Mild cardiomegaly is noted. Mild to moderate right pleural effusion is noted with associated atelectasis. Left lung is unremarkable. Bony thorax is unremarkable. IMPRESSION: Mild to moderate right pleural effusion is noted with associated atelectasis. Aortic Atherosclerosis (ICD10-I70.0). Electronically Signed   By: Marijo Conception M.D.   On: 08/01/2022 15:46     Medications:      amiodarone  200 mg Oral  Daily   apixaban  2.5 mg Oral BID   atorvastatin  40 mg Oral Daily   citalopram  20 mg Oral Daily   feeding supplement  1 Container Oral TID BM   metoprolol tartrate  12.5 mg Oral BID   multivitamin with minerals  1 tablet Oral Daily   pantoprazole  40 mg Oral BID AC   polyethylene glycol  17 g Oral Daily   sodium bicarbonate  1,300 mg Oral Daily   sodium chloride flush  3 mL Intravenous Q12H   spironolactone  12.5 mg Oral Daily   torsemide  20 mg Oral Daily   acetaminophen **OR** acetaminophen, albuterol, calcium carbonate, guaiFENesin, HYDROcodone-acetaminophen, melatonin, simethicone  Assessment/ Plan:  Danielle [name] is a 66 y.o.  female with past medical conditions including diabetes, hypertension, lymphedema, breast cancer COPD and chronic kidney disease.  Patient presents to the emergency department of shortness of breath and has been admitted for Pleural effusion [J90] Status post thoracentesis [Z98.890] CHF exacerbation ([place]) [I50.9] Pleural effusion on right [J90] Acute on chronic combined systolic and diastolic CHF (congestive heart failure) (HCC) [I50.43] Acute on chronic respiratory failure with hypoxia (HCC) [J96.21]   Acute Kidney Injury on chronic kidney disease stage IIIb with baseline creatinine 1.50 and GFR of 38 on 06/16/22.  Acute kidney injury secondary to cardiorenal syndrome and diuretic therapy Chronic kidney disease is secondary to hypertension and CHF.  No IV contrast exposure.   Patient AKI is worsening Patient creatinine was earlier stable at 2.3--2.5. Patient  AKI had worsened patient creatinine had increased to 2.99/3.  We then reduced patient's diuretics Patient creatinine is now at 2.8    Lab Results  Component Value Date   CREATININE 2.77 (H) 08/02/2022   CREATININE 2.99 (H) 08/01/2022   CREATININE 2.48 (H) 07/31/2022    Intake/Output Summary (Last 24 hours) at 08/02/2022 0755 Last data filed at 08/02/2022 0500 Gross per 24 hour   Intake 3 ml  Output 1250 ml  Net -1247 ml    2. Anemia of chronic kidney disease Lab Results  Component Value Date   HGB 9.4 (L) 07/31/2022    Hemoglobin at goal  3.  Hypertension with chronic kidney disease.  Home regimen includes torsemide, amiodarone, hydralazine, and isosorbide. Continue Midodrine 5mg  three times daily for BP support.   Patient is currently on torsemide  4.  Pleural effusions, recurrent.   Patient is being followed by pulmonary Patient pleural effusion is worsening Primary team is following for possible need of Pleurx     5.Acute on Chronic Combined systolic and diastolic CHF Patient CT scan does not show an increasing intravascular condition but rather increasing pleural effusion Patient was hypoxic again last night Cardiology consult now following as well  6.Social Patient has poor prognosis because of multiorgan disease process.  Patient is being followed by primary team and palliative care team has been consulted as well   7.  Acute respiratory failure Patient respiratory failure is multifactorial. Patient has acute on chronic combined systolic and diastolic CHF Patient has pleural effusion Patient has history of adenocarcinoma of right lung Patient is currently in ICU  Plan  Patient was hypoxic, short of breath.   Patient was transferred to ICU. Patient is to have thoracentesis and milrinone to help with fluid overload and CHF    LOS: 15 Danielle [name] s Danielle [name] 10/19/20237:55 AM

## 2022-08-02 NOTE — Progress Notes (Signed)
       CROSS COVER NOTE  NAME: Danielle [name] MRN: [id number] DOB : 1955/12/31 ATTENDING PHYSICIAN: @ATTENDING @    Date of Service   08/02/2022   HPI/Events of Note   Notified of hypoxic event. Danielle [name] SPO2 dropped to 88% after receiving nebulized albuterol for reports of dyspnea. She was placed on Heated High Flow by Respiratory Therapy.  Interventions   Assessment/Plan:  CXR- unchanged ABG- pH 7.38 pCO2 49 pO2 57 Bicarb 29 O2 Sat 86.1 HHF- 45L/48% Breo Ellipta and Incruse discontinued 07/27/22 by Pulmonology, consider restarting      This document was prepared using Dragon voice recognition software and may include unintentional dictation errors.  Danielle Glass DNP, MBA, FNP-BC Nurse Practitioner [facility] Pager [phone]

## 2022-08-02 NOTE — Consult Note (Signed)
CARDIOLOGY CONSULT NOTE               Patient ID: Danielle [name] MRN: [id number] DOB/AGE: [birth year] 66 y.o.  Admit date: 08/14/2022 Referring Physician Dr. 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 stage III lung kidney disease aortic valve disease tricuspid valve disease COPD chronic respiratory failure with hypoxemia on 4 L O2 history of breast cancer status postmastectomy she is esophageal cancer with dysmotility recurrent right pleural effusion requiring multiple thoracentesis patient now presents with symptoms of worsening shortness of breath dyspnea chest tightness denies back as well as syncope blood pressures been reasonable sats in the low 90s on oxygen troponins were negative COVID was negative but because of worsening symptoms the patient was admitted for further evaluation and cardiology was consulted because of chest pain and atrial fibrillation  Review of systems complete and found to be negative unless listed above     Past Medical History:  Diagnosis Date   Acute respiratory failure due to COVID-19 ([place]) 09/27/2019   Anemia    Anxiety    Aortic atherosclerosis (HCC)    Aortic valve stenosis 02/10/2018   a.) TTE 02/10/2018: EF 55-60%: mild AS with MPG of 12 mmHg. b.) TTE 04/21/2018: EF 35-40%; mild AS with MPG 8 mmHg. c.) TTE 11/07/2019: EF 50-55%; mild AS with MPG 10 mmHg.   Arthritis    Atrial fibrillation (HCC)    a.) CHA2DS2-VASc = 4 (age, sex, HTN, aortic plaque). b.) Rate/rhythm maintained on oral carvedilol; chronically anticoagulated on dose reduced apixaban. c.)  Attempted deployment of LAA occlusive device on 01/11/2021; parameters failed and procedure  aborted.   Breast cancer, left ([place]) 2000   a.) T2N1M0; ER/PR (+) --> Tx'd with total mastectomy, LN resection, XRT, and chemotherapy   Cancer of right lung ([place]) 07/30/2016   a.) adenocarcinoma; ALK, ROS1, PDL1, BRAF, EGFR all negative.   CHF (congestive heart failure) (HCC)    Chronic diastolic CHF (congestive heart failure) ([place]) 11/26/2021   CKD (chronic kidney disease), stage IV (HCC)    COPD (chronic obstructive pulmonary disease) (HCC)    Dependence on supplemental oxygen    Depression    Diastolic dysfunction 80/12/4915   a.) TTE 02/10/2017: EF 55-60%; G2DD. b.) TTE 04/21/2018: EF 35-40%; mild LA dilation; mod MV regurgitation. c.) TTE 11/07/2019: EF 50-55%; G1DD.   DOE (dyspnea on exertion)    GERD (gastroesophageal reflux disease)    Heart murmur    History of 2019 novel coronavirus disease (COVID-19) 10/14/2019   HLD (hyperlipidemia)    Hypertension    Long term current use of anticoagulant    a.) apixaban   Lymphedema    Personal history of chemotherapy    Personal history of radiation therapy    Respiratory tract infection due to COVID-19 virus 09/27/2019   SBO (small bowel obstruction) ([place]) 11/05/2020   Vitamin D deficiency     Past Surgical History:  Procedure Laterality Date   Breast Biospy Left    [facility]   BREAST SURGERY     CARDIOVERSION N/A 02/23/2022   Procedure: CARDIOVERSION;  Surgeon: Corey Skains, MD;  Location: [facility] ORS;  Service: Cardiovascular;  Laterality: N/A;   COLONOSCOPY N/A 04/30/2018   Procedure: COLONOSCOPY;  Surgeon: Virgel Manifold, MD;  Location: [facility]  ENDOSCOPY;  Service: Endoscopy;  Laterality: N/A;   COLONOSCOPY N/A 07/22/2018   Procedure: COLONOSCOPY;  Surgeon: Virgel Manifold, MD;  Location: [facility] ENDOSCOPY;  Service: Endoscopy;  Laterality: N/A;   COLONOSCOPY WITH PROPOFOL N/A 09/21/2021   Procedure: COLONOSCOPY WITH PROPOFOL;  Surgeon: Benjamine Sprague, DO;  Location: [facility] ENDOSCOPY;  Service: General;  Laterality: N/A;    COLONOSCOPY WITH PROPOFOL N/A 03/11/2022   Procedure: COLONOSCOPY WITH PROPOFOL;  Surgeon: Annamaria Helling, DO;  Location: [facility] ENDOSCOPY;  Service: Gastroenterology;  Laterality: N/A;   DILATION AND CURETTAGE OF UTERUS     ELECTROMAGNETIC NAVIGATION BROCHOSCOPY Right 04/11/2016   Procedure: ELECTROMAGNETIC NAVIGATION BRONCHOSCOPY;  Surgeon: Vilinda Boehringer, MD;  Location: [facility] ORS;  Service: Cardiopulmonary;  Laterality: Right;   ESOPHAGOGASTRODUODENOSCOPY N/A 07/22/2018   Procedure: ESOPHAGOGASTRODUODENOSCOPY (EGD);  Surgeon: Virgel Manifold, MD;  Location: [facility] ENDOSCOPY;  Service: Endoscopy;  Laterality: N/A;   ESOPHAGOGASTRODUODENOSCOPY (EGD) WITH PROPOFOL N/A 05/07/2018   Procedure: ESOPHAGOGASTRODUODENOSCOPY (EGD) WITH PROPOFOL;  Surgeon: Lucilla Lame, MD;  Location: [facility] ENDOSCOPY;  Service: Endoscopy;  Laterality: N/A;   ESOPHAGOGASTRODUODENOSCOPY (EGD) WITH PROPOFOL N/A 04/24/2019   Procedure: ESOPHAGOGASTRODUODENOSCOPY (EGD) WITH PROPOFOL;  Surgeon: Jonathon Bellows, MD;  Location: [facility] ENDOSCOPY;  Service: Gastroenterology;  Laterality: N/A;   ESOPHAGOGASTRODUODENOSCOPY (EGD) WITH PROPOFOL N/A 01/12/2020   Procedure: ESOPHAGOGASTRODUODENOSCOPY (EGD) WITH PROPOFOL;  Surgeon: Jonathon Bellows, MD;  Location: [facility] ENDOSCOPY;  Service: Gastroenterology;  Laterality: N/A;   ESOPHAGOGASTRODUODENOSCOPY (EGD) WITH PROPOFOL N/A 04/28/2020   Procedure: ESOPHAGOGASTRODUODENOSCOPY (EGD) WITH PROPOFOL;  Surgeon: Jonathon Bellows, MD;  Location: [facility] ENDOSCOPY;  Service: Gastroenterology;  Laterality: N/A;   ESOPHAGOGASTRODUODENOSCOPY (EGD) WITH PROPOFOL N/A 03/11/2022   Procedure: ESOPHAGOGASTRODUODENOSCOPY (EGD) WITH PROPOFOL;  Surgeon: Annamaria Helling, DO;  Location: [facility] ENDOSCOPY;  Service: Gastroenterology;  Laterality: N/A;   EUS N/A 05/07/2019   Procedure: FULL UPPER ENDOSCOPIC ULTRASOUND (EUS) RADIAL;  Surgeon: Jola Schmidt, MD;  Location: [facility] ENDOSCOPY;  Service: Endoscopy;  Laterality: N/A;    ILEOSCOPY N/A 07/22/2018   Procedure: ILEOSCOPY THROUGH STOMA;  Surgeon: Virgel Manifold, MD;  Location: [facility] ENDOSCOPY;  Service: Endoscopy;  Laterality: N/A;   ILEOSTOMY     ILEOSTOMY N/A 09/08/2018   Procedure: ILEOSTOMY REVISION POSSIBLE CREATION;  Surgeon: Herbert Pun, MD;  Location: [facility] ORS;  Service: General;  Laterality: N/A;   ILEOSTOMY CLOSURE N/A 08/15/2018   Procedure: DILATION OF ILEOSTOMY STRICTURE;  Surgeon: Herbert Pun, MD;  Location: [facility] ORS;  Service: General;  Laterality: N/A;   LAPAROTOMY Right 05/04/2018   Procedure: EXPLORATORY LAPAROTOMY right colectomy right and left ostomy;  Surgeon: Herbert Pun, MD;  Location: [facility] ORS;  Service: General;  Laterality: Right;   LEFT ATRIAL APPENDAGE OCCLUSION N/A 01/11/2021   Procedure: LEFT ATRIAL APPENDAGE OCCLUSION ([place]); ABORTED PROCEDURE WITHOUT DEVICE BEING IMPLANTED; Location: [facility]; Surgeon: Mylinda Latina, MD   LUNG BIOPSY     MASTECTOMY Left    2000, [place] Right    [place] TAKEDOWN N/A 10/23/2021   Procedure: XI ROBOTIC ASSISTED ILEOSTOMY TAKEDOWN;  Surgeon: Herbert Pun, MD;  Location: [facility] ORS;  Service: General;  Laterality: N/A;  180 minutes for the surgery part please    Medications Prior to Admission  Medication Sig Dispense Refill Last Dose   acetaminophen (TYLENOL) 325 MG tablet Take 2 tablets (650 mg total) by mouth every 6 (six) hours as needed for mild pain (or Fever >/= 101). (Patient taking differently: Take 650 mg by mouth every 4 (four) hours  as needed for moderate pain.)   prn at prn   albuterol (PROVENTIL HFA;VENTOLIN HFA) 108 (90 Base) MCG/ACT inhaler Inhale 2 puffs into the lungs every 6 (six) hours as needed for wheezing or shortness of breath. 1 Inhaler 2 prn at prn   albuterol (PROVENTIL) (2.5 MG/3ML) 0.083% nebulizer solution Take 2.5 mg by nebulization daily as needed.   prn at prn   amiodarone  (PACERONE) 200 MG tablet Take 1 tablet (200 mg total) by mouth daily. 60 tablet 0 08/12/2022 at 1100   apixaban (ELIQUIS) 2.5 MG TABS tablet Take 1 tablet (2.5 mg total) by mouth 2 (two) times daily. 60 tablet 0 07/17/2022 at 1100   atorvastatin (LIPITOR) 40 MG tablet Take 1 tablet (40 mg total) by mouth daily. 30 tablet 2 07/31/2022 at 1100   calcium carbonate (TUMS - DOSED IN MG ELEMENTAL CALCIUM) 500 MG chewable tablet Chew 0.5 tablets by mouth daily as needed.   prn at prn   calcium-vitamin D (OSCAL WITH D) 500-5 MG-MCG tablet Take 1 tablet by mouth daily.   08/14/2022 at 1100   citalopram (CELEXA) 20 MG tablet Take 1 tablet (20 mg total) by mouth daily. 30 tablet 2 07/24/2022 at 1100   ferrous sulfate 325 (65 FE) MG tablet Take 325 mg by mouth 2 (two) times daily with a meal.   07/24/2022 at 1100   Fluticasone-Umeclidin-Vilant (TRELEGY ELLIPTA) 100-62.5-25 MCG/ACT AEPB Inhale 1 puff into the lungs daily.   07/20/2022 at 1100   hydrALAZINE (APRESOLINE) 10 MG tablet Take 1 tablet (10 mg total) by mouth 3 (three) times daily. 90 tablet 0 07/29/2022 at 1100   isosorbide dinitrate (ISORDIL) 5 MG tablet Take 1 tablet (5 mg total) by mouth 3 (three) times daily. 90 tablet 0 07/31/2022 at 1100   loperamide (IMODIUM) 2 MG capsule Take 2 mg by mouth daily as needed for diarrhea or loose stools.   prn at prn   metoprolol tartrate (LOPRESSOR) 25 MG tablet Take 1 tablet (25 mg total) by mouth 2 (two) times daily. (Patient taking differently: Take 50 mg by mouth 2 (two) times daily.) 60 tablet 2 07/25/2022 at 1100   Multiple Vitamin (MULTIVITAMIN WITH MINERALS) TABS tablet Take 1 tablet by mouth daily.   07/25/2022 at 1100   pantoprazole (PROTONIX) 40 MG tablet Take 1 tablet (40 mg total) by mouth 2 (two) times daily before a meal. 60 tablet 2 07/20/2022 at 1100   potassium chloride SA (KLOR-CON M) 20 MEQ tablet Take 2 tablets (40 mEq total) by mouth daily. (Patient taking differently: Take 20 mEq by mouth 2 (two) times  daily.) 60 tablet 3 07/17/2022 at 1100   predniSONE (DELTASONE) 5 MG tablet Take 5 mg by mouth daily with breakfast.   08/10/2022 at 1100   simethicone (MYLICON) 80 MG chewable tablet Chew 1 tablet (80 mg total) by mouth every 6 (six) hours as needed for flatulence. 30 tablet 0 prn at prn   sodium bicarbonate 650 MG tablet Take 1,300 mg by mouth 3 (three) times daily.   07/25/2022 at 1100   Torsemide 40 MG TABS Take 20 mg by mouth daily. Hold this medication until you see your cardiologist (Patient taking differently: Take 40 mg by mouth daily.)   07/17/2022 at 1100   zolpidem (AMBIEN) 5 MG tablet Take 5 mg by mouth at bedtime as needed for sleep.   prn at prn   OXYGEN Inhale 4 L into the lungs daily.      potassium chloride (  KLOR-CON) 8 MEQ tablet Take by mouth. (Patient not taking: Reported on 07/26/2022)   Not Taking   sucralfate (CARAFATE) 1 GM/10ML suspension Take 0.5 g by mouth 2 (two) times daily. (Patient not taking: Reported on 07/29/2022)   Not Taking   Social History   Socioeconomic History   Marital status: Divorced    Spouse name: Not on file   Number of children: 3   Years of education: Not on file   Highest education level: Not on file  Occupational History   Occupation: Retired   Tobacco Use   Smoking status: Former    Packs/day: 0.50    Years: 20.00    Total pack years: 10.00    Types: Cigarettes    Quit date: 07/02/2012    Years since quitting: 10.0   Smokeless tobacco: Current    Types: Snuff  Vaping Use   Vaping Use: Never used  Substance and Sexual Activity   Alcohol use: Yes    Alcohol/week: 3.0 standard drinks of alcohol    Types: 3 Cans of beer per week    Comment: Occasionally beer   Drug use: No   Sexual activity: Not Currently  Other Topics Concern   Not on file  Social History Narrative   Not on file   Social Determinants of Health   Financial Resource Strain: High Risk (07/05/2018)   Overall Financial Resource Strain (CARDIA)    Difficulty of  Paying Living Expenses: Hard  Food Insecurity: No Food Insecurity (07/19/2022)   Hunger Vital Sign    Worried About Running Out of Food in the Last Year: Never true    Ran Out of Food in the Last Year: Never true  Transportation Needs: No Transportation Needs (07/19/2022)   PRAPARE - Hydrologist (Medical): No    Lack of Transportation (Non-Medical): No  Physical Activity: Insufficiently Active (07/05/2018)   Exercise Vital Sign    Days of Exercise per Week: 2 days    Minutes of Exercise per Session: 30 min  Stress: No Stress Concern Present (07/05/2018)   Register    Feeling of Stress : Not at all  Social Connections: Somewhat Isolated (07/05/2018)   Social Connection and Isolation Panel [NHANES]    Frequency of Communication with Friends and Family: More than three times a week    Frequency of Social Gatherings with Friends and Family: More than three times a week    Attends Religious Services: 1 to 4 times per year    Active Member of [employer] or Organizations: No    Attends Archivist Meetings: Never    Marital Status: Divorced  Human resources officer Violence: Not At Risk (07/19/2022)   Humiliation, Afraid, Rape, and Kick questionnaire    Fear of Current or Ex-Partner: No    Emotionally Abused: No    Physically Abused: No    Sexually Abused: No    Family History  Problem Relation Age of Onset   Breast cancer Mother 44   Cancer Mother        Breast    Cirrhosis Father    Breast cancer Paternal Aunt 55   Cancer Maternal Aunt        Breast       Review of systems complete and found to be negative unless listed above      PHYSICAL EXAM  General: Well developed, well nourished, in no acute distress HEENT:  Normocephalic and atramatic  Neck:  No JVD.  Lungs: Clear bilaterally to auscultation and percussion. Heart: HRRR . Normal S1 and S2 without gallops or murmurs.  Abdomen:  Bowel sounds are positive, abdomen soft and non-tender  Msk:  Back normal, normal gait. Normal strength and tone for age. Extremities: No clubbing, cyanosis or edema.   Neuro: Alert and oriented X 3. Psych:  Good affect, responds appropriately  Labs:   Lab Results  Component Value Date   WBC 5.5 07/31/2022   HGB 9.4 (L) 07/31/2022   HCT 29.5 (L) 07/31/2022   MCV 97.4 07/31/2022   PLT 223 07/31/2022    Recent Labs  Lab 08/02/22 0356  NA 139  K 3.6  CL 103  CO2 27  BUN 76*  CREATININE 2.77*  CALCIUM 8.9  GLUCOSE 96   Lab Results  Component Value Date   CKTOTAL 207 06/26/2013   CKMB 1.9 06/27/2013   TROPONINI <0.03 12/24/2018    Lab Results  Component Value Date   CHOL 152 01/21/2020   Lab Results  Component Value Date   HDL 87 01/21/2020   Lab Results  Component Value Date   LDLCALC 53 01/21/2020   Lab Results  Component Value Date   TRIG 95 11/06/2021   TRIG 57 11/02/2021   TRIG 60 01/21/2020   Lab Results  Component Value Date   CHOLHDL 1.7 01/21/2020   No results found for: "LDLDIRECT"    Radiology: [facility] Chest Port 1 View  Result Date: 08/02/2022 CLINICAL DATA:  Shortness of breath. EXAM: PORTABLE CHEST 1 VIEW COMPARISON:  Chest radiograph dated 08/01/2022. FINDINGS: Diffuse interstitial prominence may represent edema or developing infiltrate. Similar appearance of right pleural effusion and right lung base atelectasis or infiltrate. There is no pneumothorax. Stable cardiac silhouette. Atherosclerotic calcification of the aorta. No acute osseous pathology. IMPRESSION: Similar appearance of right pleural effusion and right lung base atelectasis or infiltrate. Electronically Signed   By: Anner Crete M.D.   On: 08/02/2022 00:36   CT CHEST WO CONTRAST  Result Date: 08/01/2022 CLINICAL DATA:  Chronic dyspnea. EXAM: CT CHEST WITHOUT CONTRAST TECHNIQUE: Multidetector CT imaging of the chest was performed following the standard protocol without IV  contrast. RADIATION DOSE REDUCTION: This exam was performed according to the departmental dose-optimization program which includes automated exposure control, adjustment of the mA and/or kV according to patient size and/or use of iterative reconstruction technique. COMPARISON:  Recent chest CT 07/20/2022 FINDINGS: Cardiovascular: The heart is within normal limits in size and stable. Stable advanced atherosclerotic calcification involving the aorta and coronary arteries. Mediastinum/Nodes: Stable scattered mediastinal and hilar lymph nodes but no mass or overt adenopathy. The esophagus is grossly normal. Lungs/Pleura: Persistent and slightly larger right pleural effusion with overlying atelectasis. Stable probable post treatment changes involving the right upper lobe with dense areas of masslike fibrotic changes. Recommend clinical correlation. Stable severe underlying emphysematous changes and pulmonary scarring. Stable 8 mm superior segment left lower lobe pulmonary nodule on image number 66/3. This has increased in size since the prior study from May 2023 and worrisome for a metastatic focus. Upper Abdomen: No significant upper abdominal findings. Stable severe vascular disease. Stable left hepatic lobe cyst. Musculoskeletal: Status post left mastectomy. No axillary adenopathy. The thyroid gland is grossly normal. The bony thorax is intact. IMPRESSION: 1. Persistent and slightly larger right pleural effusion with overlying atelectasis. 2. Stable probable post treatment changes involving the right upper lobe with dense areas of masslike fibrotic changes. 3. Stable severe emphysematous changes and  pulmonary scarring. 4. 8 mm superior segment left lower lobe pulmonary nodule enlarged since May of 2023 and worrisome for a metastatic focus. 5. Stable advanced atherosclerotic calcification involving the aorta and coronary arteries. 6. Aortic atherosclerosis. Aortic Atherosclerosis (ICD10-I70.0) and Emphysema  (ICD10-J43.9). Electronically Signed   By: Marijo Sanes M.D.   On: 08/01/2022 15:46   DG Chest 2 View  Result Date: 08/01/2022 CLINICAL DATA:  Acute on chronic congestive heart failure. EXAM: CHEST - 2 VIEW COMPARISON:  July 30, 2022. FINDINGS: Mild cardiomegaly is noted. Mild to moderate right pleural effusion is noted with associated atelectasis. Left lung is unremarkable. Bony thorax is unremarkable. IMPRESSION: Mild to moderate right pleural effusion is noted with associated atelectasis. Aortic Atherosclerosis (ICD10-I70.0). Electronically Signed   By: Marijo Conception M.D.   On: 08/01/2022 15:46   DG Chest Port 1 View  Result Date: 07/30/2022 CLINICAL DATA:  Shortness of breath. Pleural effusion. EXAM: PORTABLE CHEST 1 VIEW COMPARISON:  07/29/2022 FINDINGS: Stable enlarged cardiac silhouette, right pleural effusion, mildly prominent interstitial markings, right upper lung zone linear atelectasis or scarring and ill-defined opacity in the right lower lung zone. IMPRESSION: 1. Stable cardiomegaly and mild interstitial lung disease with possible superimposed mild interstitial pulmonary edema. 2. Stable right pleural effusion. 3. Stable right upper lung zone linear atelectasis or scarring and increased density in the right lower lung zone due to the pleural effusion with possible atelectasis or pneumonia. Electronically Signed   By: Claudie Revering M.D.   On: 07/30/2022 11:41   DG Chest Port 1 View  Result Date: 07/29/2022 CLINICAL DATA:  Dyspnea EXAM: PORTABLE CHEST 1 VIEW COMPARISON:  07/28/2022 FINDINGS: Unchanged appearance of right hilum and parahilar masslike opacity. Mild interstitial opacity without overt edema. Cardiomediastinal contours are normal. Suspect layering right basilar effusion. IMPRESSION: 1. Unchanged appearance of right hilum and parahilar masslike opacity. 2. Suspect layering right basilar effusion. Electronically Signed   By: Ulyses Jarred M.D.   On: 07/29/2022 21:07   DG  Chest Port 1 View  Result Date: 07/28/2022 CLINICAL DATA:  Shortness of breath, COPD EXAM: PORTABLE CHEST 1 VIEW COMPARISON:  07/26/2022 FINDINGS: Unchanged AP portable examination. Cardiomegaly with a small right pleural effusion and perihilar masslike consolidation. Mild, diffuse bilateral interstitial pulmonary opacity. Osseous structures unremarkable. IMPRESSION: 1. Unchanged AP portable examination. Cardiomegaly with a small right pleural effusion and perihilar masslike consolidation. 2. Mild, diffuse bilateral interstitial pulmonary opacity, likely edema. No new or focal airspace opacity. Electronically Signed   By: Delanna Ahmadi M.D.   On: 07/28/2022 12:22   DG Chest Port 1 View  Result Date: 07/26/2022 CLINICAL DATA:  Pleural effusion, shortness of breath EXAM: PORTABLE CHEST 1 VIEW COMPARISON:  07/23/2022 FINDINGS: Right pleural effusion is similar to prior study. Right lower lobe airspace opacities also stable. No confluent opacity on the left. Heart is normal size. Aortic atherosclerosis. No acute bony abnormality. IMPRESSION: Stable small right pleural effusion with right lower lobe airspace opacities. Electronically Signed   By: Rolm Baptise M.D.   On: 07/26/2022 14:26   DG Chest Port 1 View  Result Date: 07/23/2022 CLINICAL DATA:  Pleural effusion. EXAM: PORTABLE CHEST 1 VIEW COMPARISON:  Chest radiograph 07/19/2022 FINDINGS: Multiple monitoring leads overlie the patient. Stable cardiomegaly. Aortic atherosclerosis. There appears to be worsening layering right pleural effusion with increased underlying opacities within the right lung. Similar-appearing heterogeneous opacities right upper lung. Similar appearance of the left lung. No pneumothorax. Thoracic spine degenerative changes. IMPRESSION: Worsening layering right pleural  effusion with underlying opacities which may represent atelectasis or infection. Electronically Signed   By: Lovey Newcomer M.D.   On: 07/23/2022 11:39   DG ESOPHAGUS  W SINGLE CM (SOL OR THIN BA)  Result Date: 07/21/2022 CLINICAL DATA:  Patient admitted for worsening shortness of breath. Dysphagia, with solids and liquids sticking in chest. EXAM: ESOPHAGUS/BARIUM SWALLOW/TABLET STUDY TECHNIQUE: Single contrast examination was performed using thin liquid barium. This exam was performed by Narda Rutherford, NP, and was supervised and interpreted by Earle Gell, MD. FLUOROSCOPY: Radiation Exposure Index (as provided by the fluoroscopic device): 70.10 mGy Kerma COMPARISON:  CT AP dated 07/20/2022 FINDINGS: Limited examination due to patient's inability to stand and assume routine positions on fluoroscopy table. Exam was also limited by patient's inability to perform bolus swallowing severe nausea. Swallowing: No vestibular penetration or aspiration seen. Pharynx: Unremarkable. Esophagus: Patient was only able to drink a small amount of barium. This shows severe esophageal dysmotility and stasis of contrast in the esophagus. There was poor filling of the distal esophagus, and patient could not be placed in semi-erect or standing position. However, some barium does pass through the esophagus into the stomach, and no obstructing mass or stricture is identified. Hiatal Hernia: None. Ingested 40mm barium tablet: Not given Other: None. IMPRESSION: Technically limited exam as discussed above. Severe esophageal dysmotility. No evidence of esophageal obstruction. Electronically Signed   By: Marlaine Hind M.D.   On: 07/21/2022 16:36   CT CHEST ABDOMEN PELVIS WO CONTRAST  Result Date: 07/20/2022 CLINICAL DATA:  Abdominal pain, RIGHT lower quadrant pain radiating to LEFT side, acute kidney injury, question sepsis. History CHF, lung cancer, breast cancer, atrial fibrillation, COPD, former smoker EXAM: CT CHEST, ABDOMEN AND PELVIS WITHOUT CONTRAST TECHNIQUE: Multidetector CT imaging of the chest, abdomen and pelvis was performed following the standard protocol without IV contrast. Patient drank  dilute oral contrast for exam. RADIATION DOSE REDUCTION: This exam was performed according to the departmental dose-optimization program which includes automated exposure control, adjustment of the mA and/or kV according to patient size and/or use of iterative reconstruction technique. COMPARISON:  CT angio chest 06/16/2022, CT abdomen and pelvis 05/08/2022 FINDINGS: CT CHEST FINDINGS Cardiovascular: Extensive atherosclerotic calcifications aorta, proximal great vessels, and coronary arteries. Aorta normal caliber. Mild enlargement of cardiac chambers. No pericardial effusion. Mediastinum/Nodes: Esophagus unremarkable. Base of cervical region normal appearance. Prior LEFT mastectomy. No adenopathy. Lungs/Pleura: Small RIGHT pleural effusion. Emphysematous changes with chronic opacity RIGHT upper lobe with spiculated margins, 2.6 x 1.8 cm unchanged. Additional masslike area of opacity in the anterior RIGHT upper lobe extending to the pleural surface measures 4.1 x 1.9 cm, unchanged. Mild compressive atelectasis of RIGHT lower lobe. Subpleural scarring anterolateral LEFT upper lobe suspect related to prior radiation therapy to mastectomy bed, unchanged. No infiltrate identified. Nodular density posteromedial LEFT lower lobe 9 x 6 mm previously 7 x 4 mm, cannot exclude metastasis. Minimal dependent atelectasis LEFT lower lobe. Musculoskeletal: Osseous demineralization without focal bone lesion. CT ABDOMEN PELVIS FINDINGS Hepatobiliary: Question cyst anterior LEFT lobe liver 11 mm unchanged. Additional 6 mm nonspecific low-attenuation focus RIGHT lobe liver image 68 unchanged. Gallbladder and liver otherwise normal appearance. Pancreas: Normal appearance Spleen: Normal appearance Adrenals/Urinary Tract: Adrenal glands, kidneys, ureters, and bladder normal appearance Stomach/Bowel: Stomach unremarkable. Diverticulosis of descending and sigmoid colon without evidence of diverticulitis. Remaining bowel loops unremarkable.  Appendix not visualized. Vascular/Lymphatic: Extensive atherosclerotic calcifications without aneurysm. Significant atherosclerotic calcifications at the origins of the superior mesenteric and celiac arteries. No adenopathy. Reproductive:  Atrophic uterus and adnexa with question small exophytic leiomyoma from anterior uterine wall 18 mm diameter tubal ligation clips. Other: No free air or free fluid. No hernia or inflammatory process. Musculoskeletal: Osseous demineralization. Chronic mild superior endplate compression deformities of L2 and L4. IMPRESSION: Small RIGHT pleural effusion with compressive atelectasis of RIGHT lower lobe, slightly decreased. Stable masslike opacities in the RIGHT upper lobe question related to treated tumor. Slight increase in size of a LEFT lower lobe nodule 9 x 6 mm, cannot exclude metastasis. Distal colonic diverticulosis without evidence of diverticulitis. No acute intra-abdominal or intrapelvic abnormalities. Extensive atherosclerotic calcifications including coronary arteries. Aortic Atherosclerosis (ICD10-I70.0) and Emphysema (ICD10-J43.9). Electronically Signed   By: Lavonia Dana M.D.   On: 07/20/2022 19:05   DG Abd 1 View  Result Date: 07/20/2022 CLINICAL DATA:  Right lower quadrant pain EXAM: ABDOMEN - 1 VIEW COMPARISON:  06/20/2022 FINDINGS: The bowel gas pattern is normal. No radio-opaque calculi or other significant radiographic abnormality are seen. Tubal ligation clips are noted. Aortoiliac atherosclerosis. IMPRESSION: Negative. Electronically Signed   By: Davina Poke D.O.   On: 07/20/2022 15:09   US THORACENTESIS ASP PLEURAL SPACE W/IMG GUIDE  Result Date: 07/19/2022 INDICATION: Patient with history of chronic kidney disease, atrial fibrillation, congestive heart failure and right sided lung cancer status post radiation with recurrent right-sided pleural effusion. EXAM: ULTRASOUND GUIDED RIGHT THORACENTESIS MEDICATIONS: Local 1% lidocaine only. COMPLICATIONS:  None immediate. PROCEDURE: An ultrasound guided thoracentesis was thoroughly discussed with the patient and questions answered. The benefits, risks, alternatives and complications were also discussed. The patient understands and wishes to proceed with the procedure. Written consent was obtained. Ultrasound was performed to localize and mark an adequate pocket of fluid in the right chest. The area was then prepped and draped in the normal sterile fashion. 1% Lidocaine was used for local anesthesia. Under ultrasound guidance a 19 gauge, 7-cm, Yueh catheter was introduced. Thoracentesis was performed. The catheter was removed and a dressing applied. FINDINGS: A total of approximately 900 mL of amber colored fluid was removed. IMPRESSION: Successful ultrasound guided right thoracentesis yielding 900 mL of pleural fluid. This exam was performed by Tsosie Billing PA-C, and was supervised and interpreted by Dr. Earleen Newport. Electronically Signed   By: Corrie Mckusick D.O.   On: 07/19/2022 11:43   DG Chest Port 1 View  Result Date: 07/19/2022 CLINICAL DATA:  Post thoracentesis. EXAM: PORTABLE CHEST 1 VIEW COMPARISON:  Radiographs 07/20/2022 and 06/19/2022.  CT 06/16/2022. FINDINGS: 1121 hours. The right pleural effusion appears decreased in volume. There is improved aeration of the right lung base. No evidence of pneumothorax. The heart size and mediastinal contours are stable. The right suprahilar lesion appears grossly stable. No edema or new airspace disease identified. The bones appear unchanged. Telemetry leads overlie the chest. IMPRESSION: Decreased right pleural effusion and improved aeration of the right lung base following thoracentesis. No evidence of pneumothorax. Electronically Signed   By: Richardean Sale M.D.   On: 07/19/2022 11:39   DG Chest Port 1 View  Result Date: 08/05/2022 CLINICAL DATA:  Short of breath, left breast cancer EXAM: PORTABLE CHEST 1 VIEW COMPARISON:  06/19/2022, 06/16/2022 FINDINGS: Single  frontal view of the chest demonstrates stable enlargement of the cardiac silhouette. There is a small right pleural effusion, increased since prior study. Increased consolidation at the right lung base may reflect atelectasis or airspace disease. Stable linear consolidation right suprahilar region. No pneumothorax. No acute bony abnormality. IMPRESSION: 1. Small right pleural effusion, increased since  prior study. 2. Increasing right basilar consolidation, which may reflect airspace disease or progressive atelectasis. 3. Masslike linear consolidation right suprahilar region, unchanged since recent CT. Electronically Signed   By: Randa Ngo M.D.   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 Acute on chronic respiratory failure with hypoxemia Mild aortic stenosis Cardiomyopathy reduced left ventricular function Pulmonary hypertension Acute renal insufficiency Hypotension Recurrent right pleural effusion Malignant neoplasm right upper  lobe Dysphagia esophageal dysmotility  Plan Agreed admit continue respiratory support supplemental oxygen inhalers Hydration for hypotension do not necessarily recommend pressures Thoracentesis of pleural effusion should be considered therapeutically Murmur with moderate to severe aortic stenosis continue conservative medical therapy Pulmonary hypertension continue supportive care by pulmonary including inhalers Recommend palliative care consult    Signed: Yolonda Kida MD 08/02/2022, 1:25 PM

## 2022-08-02 NOTE — Progress Notes (Addendum)
1500 Received patient from floor. Check in with CHG bath done. Patient wrapped in warm blankets after bath. HFNC at 60%. 1548 Patient complains of right arm pain. Given prn for pain. 1615 Patient sleeping now. 1630 Notified by IV team that there is no iv team or PICC line coverage for [facility] tonight. 1800  Explained how to order diet with menu to patient. Patient very nice to visitors but quite abrupt with nurses. Remains in A Fib.with acceptable blood pressure.

## 2022-08-02 NOTE — Progress Notes (Signed)
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 Required maximal encouragement to participate in therapy . Patient refused ADL tasks. Patient was able to perform LB and UB therapeutic exercises 10 reps each (leg lifts, leg abduction and adduction, ankle pumps, and shoulder flexion). Verbal cues for technique. SP02 monitored throughout session and while completing therapeutic exercises. SP02 ranging from 95%-99% throughout session. Patient left in bed with call bell in reach, bed alarm set, and all needs met.    Recommendations for follow up therapy are one component of a multi-disciplinary discharge planning process, led by the attending physician.  Recommendations may be updated based on patient status, additional functional criteria and insurance authorization.    Follow Up Recommendations  Home health OT    Assistance Recommended at Discharge Intermittent Supervision/Assistance  Patient can return home with the following  A little help with walking and/or transfers;A little help with bathing/dressing/bathroom   Equipment Recommendations  Tub/shower bench       Precautions / Restrictions Precautions Precautions: Fall Restrictions Weight  Bearing Restrictions: No       Mobility Bed Mobility               General bed mobility comments: Refused mobility tasks.    Transfers                             ADL either performed or assessed with clinical judgement   ADL                                         General ADL Comments: Patient refused    Extremity/Trunk Assessment Upper Extremity Assessment Upper Extremity Assessment: Generalized weakness   Lower Extremity Assessment Lower Extremity Assessment: Generalized weakness        Vision Baseline Vision/History: 1 Wears glasses Patient Visual Report: No change from baseline            Cognition Arousal/Alertness: Awake/alert Behavior During Therapy: WFL for tasks assessed/performed Overall Cognitive Status: Within Functional Limits for tasks assessed                                                     Pertinent Vitals/ Pain       Pain Assessment Pain Assessment: No/denies pain   Frequency  Min 2X/week        Progress Toward Goals  OT Goals(current goals can now be found in the care plan section)  Progress towards OT goals: Progressing toward goals  Acute Rehab OT Goals Patient Stated Goal: return home OT Goal  Formulation: With patient Time For Goal Achievement: 08/05/22 Potential to Achieve Goals: Good  Plan Discharge plan remains appropriate;Frequency remains appropriate       AM-PAC OT "6 Clicks" Daily Activity     Outcome Measure   Help from another person eating meals?: None   Help from another person toileting, which includes using toliet, bedpan, or urinal?: A Little Help from another person bathing (including washing, rinsing, drying)?: A Little Help from another person to put on and taking off regular upper body clothing?: None Help from another person to put on and taking off regular lower body clothing?: None 6 Click Score: 18    End of Session Equipment Utilized  During Treatment: Oxygen (HFNC 45L)  OT Visit Diagnosis: Unsteadiness on feet (R26.81)   Activity Tolerance Patient tolerated treatment well   Patient Left in bed;with call bell/phone within reach;with bed alarm set   Nurse Communication Mobility status        Time: 6431-4276 OT Time Calculation (min): 11 min  Charges: OT General Charges $OT Visit: 1 Visit OT Treatments $Therapeutic Exercise: 8-22 mins     Niv Darley, OTS 08/02/2022, 2:07 PM

## 2022-08-03 ENCOUNTER — Ambulatory Visit: Payer: Medicare Other | Admitting: Family

## 2022-08-03 DIAGNOSIS — I480 Paroxysmal atrial fibrillation: Secondary | ICD-10-CM | POA: Diagnosis not present

## 2022-08-03 DIAGNOSIS — I5043 Acute on chronic combined systolic (congestive) and diastolic (congestive) heart failure: Secondary | ICD-10-CM | POA: Diagnosis not present

## 2022-08-03 DIAGNOSIS — N179 Acute kidney failure, unspecified: Secondary | ICD-10-CM | POA: Diagnosis not present

## 2022-08-03 DIAGNOSIS — J9621 Acute and chronic respiratory failure with hypoxia: Secondary | ICD-10-CM | POA: Diagnosis not present

## 2022-08-03 LAB — BASIC METABOLIC PANEL
Anion gap: 12 (ref 5–15)
BUN: 73 mg/dL — ABNORMAL HIGH (ref 8–23)
CO2: 26 mmol/L (ref 22–32)
Calcium: 9.1 mg/dL (ref 8.9–10.3)
Chloride: 100 mmol/L (ref 98–111)
Creatinine, Ser: 2.88 mg/dL — ABNORMAL HIGH (ref 0.44–1.00)
GFR, Estimated: 17 mL/min — ABNORMAL LOW (ref >60)
Glucose, Bld: 102 mg/dL — ABNORMAL HIGH (ref 70–99)
Potassium: 4 mmol/L (ref 3.5–5.1)
Sodium: 138 mmol/L (ref 135–145)

## 2022-08-03 LAB — BLOOD GAS, ARTERIAL
Acid-Base Excess: 3 mmol/L — ABNORMAL HIGH (ref 0.0–2.0)
Bicarbonate: 29 mmol/L — ABNORMAL HIGH (ref 20.0–28.0)
FIO2: 48 %
O2 Content: 45 L/min
O2 Saturation: 86.1 %
Patient temperature: 37
pCO2 arterial: 49 mmHg — ABNORMAL HIGH (ref 32–48)
pH, Arterial: 7.38 (ref 7.35–7.45)
pO2, Arterial: 57 mmHg — ABNORMAL LOW (ref 83–108)

## 2022-08-03 LAB — CBC
HCT: 29.7 % — ABNORMAL LOW (ref 36.0–46.0)
Hemoglobin: 9.6 g/dL — ABNORMAL LOW (ref 12.0–15.0)
MCH: 30.8 pg (ref 26.0–34.0)
MCHC: 32.3 g/dL (ref 30.0–36.0)
MCV: 95.2 fL (ref 80.0–100.0)
Platelets: 241 10*3/uL (ref 150–400)
RBC: 3.12 MIL/uL — ABNORMAL LOW (ref 3.87–5.11)
RDW: 15.9 % — ABNORMAL HIGH (ref 11.5–15.5)
WBC: 5.6 10*3/uL (ref 4.0–10.5)
nRBC: 0.4 % — ABNORMAL HIGH (ref 0.0–0.2)

## 2022-08-03 LAB — MAGNESIUM: Magnesium: 2 mg/dL (ref 1.7–2.4)

## 2022-08-03 MED ORDER — METOPROLOL TARTRATE 25 MG PO TABS
25.0000 mg | ORAL_TABLET | Freq: Three times a day (TID) | ORAL | Status: DC
  Administered 2022-08-03 – 2022-08-06 (×9): 25 mg via ORAL
  Filled 2022-08-03 (×9): qty 1

## 2022-08-03 MED ORDER — TORSEMIDE 20 MG PO TABS
20.0000 mg | ORAL_TABLET | Freq: Every day | ORAL | Status: DC
  Administered 2022-08-03 – 2022-08-08 (×6): 20 mg via ORAL
  Filled 2022-08-03 (×6): qty 1

## 2022-08-03 MED ORDER — MORPHINE SULFATE (PF) 2 MG/ML IV SOLN
1.0000 mg | INTRAVENOUS | Status: DC | PRN
  Administered 2022-08-03 – 2022-08-04 (×4): 2 mg via INTRAVENOUS
  Administered 2022-08-04: 1 mg via INTRAVENOUS
  Administered 2022-08-05 – 2022-08-06 (×7): 2 mg via INTRAVENOUS
  Administered 2022-08-07: 1 mg via INTRAVENOUS
  Administered 2022-08-07 – 2022-08-24 (×29): 2 mg via INTRAVENOUS
  Filled 2022-08-03 (×42): qty 1

## 2022-08-03 MED ORDER — METOPROLOL TARTRATE 25 MG PO TABS
25.0000 mg | ORAL_TABLET | Freq: Two times a day (BID) | ORAL | Status: DC
  Administered 2022-08-03: 25 mg via ORAL
  Filled 2022-08-03: qty 1

## 2022-08-03 NOTE — Progress Notes (Signed)
[facility]    SUBJECTIVE: Patient lying in bed comfortably appears to be ill dyspneic shortness of breath generalized weakness episode of bradycardia atrial fibrillation dyspnea weakness fatigue still requiring pain control.   Vitals:   08/03/22 1200 08/03/22 1300 08/03/22 1400 08/03/22 1429  BP: 107/65 (!) 105/91 110/78   Pulse: (!) 44 (!) 124 98   Resp: (!) 31 19 19    Temp: 97.7 F (36.5 C)     TempSrc: Oral     SpO2: 100% 91% 96% 98%  Weight:      Height:         Intake/Output Summary (Last 24 hours) at 08/03/2022 1521 Last data filed at 08/03/2022 1325 Gross per 24 hour  Intake 150.26 ml  Output 327 ml  Net -176.74 ml      PHYSICAL EXAM  General: Well developed, well nourished, in no acute distress HEENT:  Normocephalic and atramatic Neck:  No JVD.  Lungs: Clear bilaterally to auscultation and percussion. Heart: HRRR . Normal S1 and S2 without gallops or murmurs.  Abdomen: Bowel sounds are positive, abdomen soft and non-tender  Msk:  Back normal, normal gait. Normal strength and tone for age. Extremities: No clubbing, cyanosis or edema.   Neuro: Alert and oriented X 3. Psych:  Good affect, responds appropriately   LABS: Basic Metabolic Panel: Recent Labs    08/02/22 0356 08/03/22 0353  NA 139 138  K 3.6 4.0  CL 103 100  CO2 27 26  GLUCOSE 96 102*  BUN 76* 73*  CREATININE 2.77* 2.88*  CALCIUM 8.9 9.1  MG 2.0 2.0   Liver Function Tests: No results for input(s): "AST", "ALT", "ALKPHOS", "BILITOT", "PROT", "ALBUMIN" in the last 72 hours. No results for input(s): "LIPASE", "AMYLASE" in the last 72 hours. CBC: Recent Labs    08/03/22 0353  WBC 5.6  HGB 9.6*  HCT 29.7*  MCV 95.2  PLT 241   Cardiac Enzymes: No results for input(s): "CKTOTAL", "CKMB", "CKMBINDEX", "TROPONINI" in the last 72 hours. BNP: Invalid input(s): "POCBNP" D-Dimer: No results for input(s): "DDIMER" in the last 72 hours. Hemoglobin A1C: No results for input(s):  "HGBA1C" in the last 72 hours. Fasting Lipid Panel: No results for input(s): "CHOL", "HDL", "LDLCALC", "TRIG", "CHOLHDL", "LDLDIRECT" in the last 72 hours. Thyroid Function Tests: No results for input(s): "TSH", "T4TOTAL", "T3FREE", "THYROIDAB" in the last 72 hours.  Invalid input(s): "FREET3" Anemia Panel: No results for input(s): "VITAMINB12", "FOLATE", "FERRITIN", "TIBC", "IRON", "RETICCTPCT" in the last 72 hours.  DG Chest Port 1 View  Result Date: 08/02/2022 CLINICAL DATA:  Shortness of breath. EXAM: PORTABLE CHEST 1 VIEW COMPARISON:  Chest radiograph dated 08/01/2022. FINDINGS: Diffuse interstitial prominence may represent edema or developing infiltrate. Similar appearance of right pleural effusion and right lung base atelectasis or infiltrate. There is no pneumothorax. Stable cardiac silhouette. Atherosclerotic calcification of the aorta. No acute osseous pathology. IMPRESSION: Similar appearance of right pleural effusion and right lung base atelectasis or infiltrate. Electronically Signed   By: Anner Crete M.D.   On: 08/02/2022 00:36   CT CHEST WO CONTRAST  Result Date: 08/01/2022 CLINICAL DATA:  Chronic dyspnea. EXAM: CT CHEST WITHOUT CONTRAST TECHNIQUE: Multidetector CT imaging of the chest was performed following the standard protocol without IV contrast. RADIATION DOSE REDUCTION: This exam was performed according to the departmental dose-optimization program which includes automated exposure control, adjustment of the mA and/or kV according to patient size and/or use of iterative reconstruction technique. COMPARISON:  Recent chest CT 07/20/2022 FINDINGS: Cardiovascular:  The heart is within normal limits in size and stable. Stable advanced atherosclerotic calcification involving the aorta and coronary arteries. Mediastinum/Nodes: Stable scattered mediastinal and hilar lymph nodes but no mass or overt adenopathy. The esophagus is grossly normal. Lungs/Pleura: Persistent and slightly  larger right pleural effusion with overlying atelectasis. Stable probable post treatment changes involving the right upper lobe with dense areas of masslike fibrotic changes. Recommend clinical correlation. Stable severe underlying emphysematous changes and pulmonary scarring. Stable 8 mm superior segment left lower lobe pulmonary nodule on image number 66/3. This has increased in size since the prior study from May 2023 and worrisome for a metastatic focus. Upper Abdomen: No significant upper abdominal findings. Stable severe vascular disease. Stable left hepatic lobe cyst. Musculoskeletal: Status post left mastectomy. No axillary adenopathy. The thyroid gland is grossly normal. The bony thorax is intact. IMPRESSION: 1. Persistent and slightly larger right pleural effusion with overlying atelectasis. 2. Stable probable post treatment changes involving the right upper lobe with dense areas of masslike fibrotic changes. 3. Stable severe emphysematous changes and pulmonary scarring. 4. 8 mm superior segment left lower lobe pulmonary nodule enlarged since May of 2023 and worrisome for a metastatic focus. 5. Stable advanced atherosclerotic calcification involving the aorta and coronary arteries. 6. Aortic atherosclerosis. Aortic Atherosclerosis (ICD10-I70.0) and Emphysema (ICD10-J43.9). Electronically Signed   By: Marijo Sanes M.D.   On: 08/01/2022 15:46   DG Chest 2 View  Result Date: 08/01/2022 CLINICAL DATA:  Acute on chronic congestive heart failure. EXAM: CHEST - 2 VIEW COMPARISON:  July 30, 2022. FINDINGS: Mild cardiomegaly is noted. Mild to moderate right pleural effusion is noted with associated atelectasis. Left lung is unremarkable. Bony thorax is unremarkable. IMPRESSION: Mild to moderate right pleural effusion is noted with associated atelectasis. Aortic Atherosclerosis (ICD10-I70.0). Electronically Signed   By: Marijo Conception M.D.   On: 08/01/2022 15:46     Echo moderately depressed left  ventricular function EF around 35 to 40%  TELEMETRY: Atrial fibrillation rate of 115 nonspecific ST-T wave changes:  ASSESSMENT AND PLAN:  Principal Problem:   Acute on chronic combined systolic and diastolic CHF (congestive heart failure) (HCC) Active Problems:   Carcinoma of overlapping sites of left breast in female, estrogen receptor positive ([place])   Acute on chronic respiratory failure with hypoxia (HCC)   Acute kidney injury superimposed on CKD (HCC)   Depression   CKD (chronic kidney disease), stage IV (HCC)   Chronic obstructive pulmonary disease (HCC)   Hypotension   Hypomagnesemia   Anemia of chronic disease   Abdominal pain   HLD (hyperlipidemia)   Paroxysmal atrial fibrillation (HCC)   Essential hypertension   Recurrent right pleural effusion   Malignant neoplasm of upper lobe of right lung   Prolonged QT interval   Esophageal dysmotility   Moderate aortic stenosis   Moderate tricuspid regurgitation by prior echocardiogram    Plan Continue supplemental oxygen therapy for shortness of breath Congestive heart failure symptoms continue current medical therapy Acute on chronic respiratory failure continue inhalers and pulmonary care Consider thoracentesis as necessary Chronic renal insufficiency chronic stable continue current medical therapy Continue diabetes management and control Atrial fibrillation paroxysmal continue medical therapy including anticoagulation Valvular disease aortic stenosis tricuspid regurgitation continue conservative management History of lung cancer follow-up with oncology Consider palliative care    Yolonda Kida, MD 08/03/2022 3:21 PM

## 2022-08-03 NOTE — Progress Notes (Addendum)
Progress Note   Patient: Danielle [name] HFW:[id number] DOB: 11-Mar-1956 DOA: 07/21/2022     16 DOS: the patient was seen and examined on 08/03/2022   Brief hospital course: Danielle [name] is a 66 y.o. female with medical history significant for Paroxysmal A-fib on chronic anticoagulation therapy, history of chronic systolic heart failure, history of adenocarcinoma of the right lung status post radiation therapy, history of stage IIIb chronic kidney disease, aortic valve stenosis, COPD with chronic respiratory failure on 4 L of oxygen continuous, history of left breast cancer status post mastectomy, history of esophageal cancer recurrent right-sided pleural effusion requiring thoracentesis, most recently on 06/19/2022 who presented to the ER on 08/10/2022 for evaluation of worsening shortness of breath with associated chest tightness.    ED course and evaluation --- BP 135/99 with RR 29 with O2 sat 90% on increased O2 flow rate of 5 L/min.  Labs with troponin 15>>17, BNP 2935.  Treated with IV Lasix in the ED.  Chest x-ray showed -- Small right pleural effusion, increased since prior study.  Increasing right basilar consolidation, which may reflect airspace disease or progressive atelectasis.  Mass-like linear consolidation right suprahilar region, unchanged since recent CT.  Patient was admitted to hospitalist service and IV diuresis was continued.    IR was consulted for thoracentesis which was done on 10/5 with 900 cc fluid removed.   Hospital course complicated by: --Acute kidney injury after diuresis due to cardiorenal physiology.  Nephrology was consulted.   Renal function improved with diuretics held, but pt with persistent and worsening exertional dyspnea again. --Abdominal pain for which evaluation including abdominal xray and CT abdomen/pelvis was unremarkable. --Swallowing difficulties with severe esophageal dysmotility discovered on barium esophagram.    10/19.  Patient developed  worsening hypoxemia, currently on high flow oxygen.  We will place a Pleurx catheter as patient pleural effusion has reaccumulated, planned on Monday. Also restarted Lasix drip with milrinone after cardiology consult. 10/20.      Assessment and Plan: Acute on chronic combined systolic and diastolic CHF (congestive heart failure) (HCC) Acute on chronic respiratory failure with hypoxia (HCC) Moderate aortic stenosis, probably severe aortic stenosis due to reduced ejection fraction. Moderate to severe tricuspid regurgitation. Moderate pulm hypertension 2021, probably severe now. Echocardiogram performed on 01/2022 showed ejection from 35 to 40%, also showed moderate aortic stenosis, moderate to severe tricuspid regurgitation.  The echocardiogram performed 2021, patient also had moderate pulm hypertension, she probably had severe pulm hypertension now. Patient is transferred to stepdown unit on 10/19, started on milrinone and IV Lasix drip. However, patient did not diuresing well with Lasix drip, still significant short of breath and hypoxemia, renal functions are worse, she developed more tachycardia with atrial fibrillation.  It seems that volume is not her main problem.  Her condition is terminal.  But the patient still does not want comfort care, still want full code. I do not believe continue Lasix drip would be any beneficial to her, I will change diuretics back to torsemide 20 mg daily and Aldactone 25 mg daily. However, I will continue milrinone for now.  After discussion with radiology, Pleurx catheter is scheduled on Monday. Patient prognosis is very poor, but very high risk of near-term mortality. We will continue discussion with the patient and her daughter, at least to try to put her on DO NOT RESUSCITATE status.   Acute kidney injury on chronic kidney disease stage IIIb. Renal function is slightly worse today, continue to monitor.   Transient  hypotension No recurrence.     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   Recurrent right pleural effusion. Last thoracentesis was 10/5 with 900 mm fluid removed.  Reviewed her previous cytology results from thoracentesis, no malignancy identified. Pleurx catheter is scheduled on Monday.   Dysphagia secondary to esophageal dysmotility  Followed by speech therapy.   Prolonged QT interval. Avoid medication that can further prolong QT interval, monitor electrolytes.   Paroxysmal atrial fibrillation with rapid ventricular response. Heart rate much faster today, blood pressure stable, will increase metoprolol to 25 mg 3 times a day.   Severe COPD  CT chest showed a severe emphysema with large bullae.         Subjective:  Patient still has significant short of breath with minimal exertion, still on 60% oxygen. No cough.  Physical Exam: Vitals:   08/03/22 0700 08/03/22 0800 08/03/22 0802 08/03/22 0830  BP: (!) 118/95 (!) 123/93 (!) 123/93 100/69  Pulse: 67 (!) 142 (!) 141 (!) 142  Resp: (!) 29 (!) 21 (!) 21 17  Temp:    98 F (36.7 C)  TempSrc:    Oral  SpO2: 91% 91% 92% 92%  Weight:      Height:       General exam: Appears calm and comfortable  Respiratory system: Decreased breath sounds, worse on the right. Respiratory effort normal. Cardiovascular system: Irregularly irregular, tachycardic. No JVD, murmurs, rubs, gallops or clicks. No pedal edema. Gastrointestinal system: Abdomen is nondistended, soft and nontender. No organomegaly or masses felt. Normal bowel sounds heard. Central nervous system: Alert and oriented x3. No focal neurological deficits. Extremities: Symmetric 5 x 5 power. Skin: No rashes, lesions or ulcers Psychiatry: Mood & affect appropriate.   Data Reviewed:  Lab results reviewed.  Family  Communication: Daughter updated.  Disposition: Status is: Inpatient Remains inpatient appropriate because: Severity of disease, IV treatment.  Planned Discharge Destination:  tbd    Time spent: 55 minutes  Author: Sharen Hones, MD 08/03/2022 9:39 AM  For on call review [email or web address].

## 2022-08-03 NOTE — Progress Notes (Signed)
Patient had an episode where her oxygen saturation went to 69%, respirations in the high 20s, increased work of breathing, and pursed lip breathing.  Patient voiced having shortness of breath.  HFNC was increased to max settings until recovery of oxygen saturations and relief of respiratory distress.  RT was called and Dr. Roosevelt Locks was notified during episode as well.  RT came to bedside and patient is currently now on 85% HFNC at 55L, morphine prn was also administered as ordered for shortness of breath.  Dr. Roosevelt Locks instructed patient could go on bipap if requirement becomes needed and ordered for patient to be NPO.  Will continue to monitor patient and endorse to oncoming shift.

## 2022-08-03 NOTE — Progress Notes (Signed)
[facility]  ROUNDING NOTE   Subjective:   Danielle [name] 66 year old female with past medical conditions including diabetes, hypertension, lymphedema, breast cancer COPD and chronic kidney disease.  Patient presents to the emergency department of shortness of breath and has been admitted for Pleural effusion [J90] Status post thoracentesis [Z98.890] CHF exacerbation (HCC) [I50.9] Pleural effusion on right [J90] Acute on chronic combined systolic and diastolic CHF (congestive heart failure) (HCC) [I50.43] Acute on chronic respiratory failure with hypoxia ([place]) [J96.21]  Patient is known to our practice and is followed outpatient by Dr. Holley Raring.  She was last seen in office on April 19, 2022.    Patient was seen today on  ICU Patient's main complaint in today visit was that she has been through a lot. I empathized with the patient and discussed with the patient about her kidney related issues  Objective:  Vital signs in last 24 hours:  Temp:  [97.7 F (36.5 C)-98.4 F (36.9 C)] 97.7 F (36.5 C) (10/20 1200) Pulse Rate:  [44-144] 103 (10/20 1500) Resp:  [13-31] 23 (10/20 1500) BP: (91-136)/(59-118) 98/76 (10/20 1500) SpO2:  [88 %-100 %] 98 % (10/20 1500) FiO2 (%):  [55 %-70 %] 70 % (10/20 1500) Weight:  [50.4 kg] 50.4 kg (10/20 0400)  Weight change: 0 kg Filed Weights   08/02/22 0517 08/02/22 1500 08/03/22 0400  Weight: 54.8 kg 54.8 kg 50.4 kg    Intake/Output: I/O last 3 completed shifts: In: 619.1 [P.O.:580; I.V.:39.1] Out: 1225 [Urine:1225]   Intake/Output this shift:  Total I/O In: 21.8 [I.V.:21.8] Out: 52 [Urine:50; Stool:2]  Physical Exam: General: Sitting side of bed  Head: Normocephalic, atraumatic. Moist oral mucosal membranes  Eyes: Anicteric  Lungs:  Moderate respiratory distress,Decreased breath sounds at bases,   Heart: Irregular rate and rhythm  Abdomen:  Soft, tender, nondistended  Extremities: 1+ peripheral edema.  Neurologic: Nonfocal,  moving all four extremities  Skin: No lesions  Access: None    Basic Metabolic Panel: Recent Labs  Lab 07/30/22 0442 07/31/22 0425 08/01/22 0440 08/02/22 0356 08/03/22 0353  NA 143 141 139 139 138  K 4.1 3.9 4.2 3.6 4.0  CL 105 103 101 103 100  CO2 28 27 26 27 26   GLUCOSE 92 119* 95 96 102*  BUN 61* 63* 70* 76* 73*  CREATININE 2.36* 2.48* 2.99* 2.77* 2.88*  CALCIUM 9.0 9.1 8.9 8.9 9.1  MG  --   --   --  2.0 2.0    Liver Function Tests: No results for input(s): "AST", "ALT", "ALKPHOS", "BILITOT", "PROT", "ALBUMIN" in the last 168 hours.  No results for input(s): "LIPASE", "AMYLASE" in the last 168 hours. No results for input(s): "AMMONIA" in the last 168 hours.  CBC: Recent Labs  Lab 07/28/22 1410 07/31/22 0425 08/03/22 0353  WBC 6.6 5.5 5.6  NEUTROABS 4.7  --   --   HGB 10.4* 9.4* 9.6*  HCT 32.8* 29.5* 29.7*  MCV 97.9 97.4 95.2  PLT 230 223 241    Cardiac Enzymes: No results for input(s): "CKTOTAL", "CKMB", "CKMBINDEX", "TROPONINI" in the last 168 hours.  BNP: Invalid input(s): "POCBNP"  CBG: Recent Labs  Lab 08/02/22 1525  GLUCAP 120*    Microbiology: Results for orders placed or performed during the hospital encounter of 08/04/2022  Resp Panel by RT-PCR (Flu A&B, Covid) Anterior Nasal Swab     Status: None   Collection Time: 07/16/2022  6:43 PM   Specimen: Anterior Nasal Swab  Result Value Ref Range Status  SARS Coronavirus 2 by RT PCR NEGATIVE NEGATIVE Final    Comment: (NOTE) SARS-CoV-2 target nucleic acids are NOT DETECTED.  The SARS-CoV-2 RNA is generally detectable in upper respiratory specimens during the acute phase of infection. The lowest concentration of SARS-CoV-2 viral copies this assay can detect is 138 copies/mL. A negative result does not preclude SARS-Cov-2 infection and should not be used as the sole basis for treatment or other patient management decisions. A negative result may occur with  improper specimen  collection/handling, submission of specimen other than nasopharyngeal swab, presence of viral mutation(s) within the areas targeted by this assay, and inadequate number of viral copies(<138 copies/mL). A negative result must be combined with clinical observations, patient history, and epidemiological information. The expected result is Negative.  Fact Sheet for Patients:  EntrepreneurPulse.com.au  Fact Sheet for Healthcare Providers:  IncredibleEmployment.be  This test is no t yet approved or cleared by the [place] FDA and  has been authorized for detection and/or diagnosis of SARS-CoV-2 by FDA under an Emergency Use Authorization (EUA). This EUA will remain  in effect (meaning this test can be used) for the duration of the COVID-19 declaration under Section 564(b)(1) of the Act, 21 U.S.C.section 360bbb-3(b)(1), unless the authorization is terminated  or revoked sooner.       Influenza A by PCR NEGATIVE NEGATIVE Final   Influenza B by PCR NEGATIVE NEGATIVE Final    Comment: (NOTE) The Xpert Xpress SARS-CoV-2/FLU/RSV plus assay is intended as an aid in the diagnosis of influenza from Nasopharyngeal swab specimens and should not be used as a sole basis for treatment. Nasal washings and aspirates are unacceptable for Xpert Xpress SARS-CoV-2/FLU/RSV testing.  Fact Sheet for Patients: EntrepreneurPulse.com.au  Fact Sheet for Healthcare Providers: IncredibleEmployment.be  This test is not yet approved or cleared by the [place] FDA and has been authorized for detection and/or diagnosis of SARS-CoV-2 by FDA under an Emergency Use Authorization (EUA). This EUA will remain in effect (meaning this test can be used) for the duration of the COVID-19 declaration under Section 564(b)(1) of the Act, 21 U.S.C. section 360bbb-3(b)(1), unless the authorization is terminated or revoked.  Performed at [facility], [address] [place], [place] [address]   MRSA Next Gen by PCR, Nasal     Status: None   Collection Time: 08/02/22  3:51 PM   Specimen: Nasal Mucosa; Nasal Swab  Result Value Ref Range Status   MRSA by PCR Next Gen NOT DETECTED NOT DETECTED Final    Comment: (NOTE) The GeneXpert MRSA Assay (FDA approved for NASAL specimens only), is one component of a comprehensive MRSA colonization surveillance program. It is not intended to diagnose MRSA infection nor to guide or monitor treatment for MRSA infections. Test performance is not FDA approved in patients less than 82 years old. Performed at [facility], [address] [place], [place] [address]     Coagulation Studies: No results for input(s): "LABPROT", "INR" in the last 72 hours.  Urinalysis: No results for input(s): "COLORURINE", "LABSPEC", "PHURINE", "GLUCOSEU", "HGBUR", "BILIRUBINUR", "KETONESUR", "PROTEINUR", "UROBILINOGEN", "NITRITE", "LEUKOCYTESUR" in the last 72 hours.  Invalid input(s): "APPERANCEUR"    Imaging: DG Chest Port 1 View  Result Date: 08/02/2022 CLINICAL DATA:  Shortness of breath. EXAM: PORTABLE CHEST 1 VIEW COMPARISON:  Chest radiograph dated 08/01/2022. FINDINGS: Diffuse interstitial prominence may represent edema or developing infiltrate. Similar appearance of right pleural effusion and right lung base atelectasis or infiltrate. There is no pneumothorax. Stable cardiac silhouette. Atherosclerotic  calcification of the aorta. No acute osseous pathology. IMPRESSION: Similar appearance of right pleural effusion and right lung base atelectasis or infiltrate. Electronically Signed   By: Anner Crete M.D.   On: 08/02/2022 00:36     Medications:    sodium chloride     milrinone 0.25 mcg/kg/min (08/03/22 1552)     amiodarone  200 mg Oral Daily   apixaban  2.5 mg Oral BID   atorvastatin  40 mg Oral Daily   Chlorhexidine Gluconate Cloth  6 each Topical Daily   citalopram  20  mg Oral Daily   feeding supplement  1 Container Oral TID BM   metoprolol tartrate  25 mg Oral Q8H   multivitamin with minerals  1 tablet Oral Daily   pantoprazole  40 mg Oral BID AC   polyethylene glycol  17 g Oral Daily   sodium bicarbonate  1,300 mg Oral Daily   sodium chloride flush  3 mL Intravenous Q12H   spironolactone  12.5 mg Oral Daily   torsemide  20 mg Oral Daily   sodium chloride, acetaminophen **OR** acetaminophen, albuterol, calcium carbonate, guaiFENesin, HYDROcodone-acetaminophen, melatonin, morphine injection, simethicone  Assessment/ Plan:  Danielle [name] is a 66 y.o.  female with past medical conditions including diabetes, hypertension, lymphedema, breast cancer COPD and chronic kidney disease.  Patient presents to the emergency department of shortness of breath and has been admitted for Pleural effusion [J90] Status post thoracentesis [Z98.890] CHF exacerbation ([place]) [I50.9] Pleural effusion on right [J90] Acute on chronic combined systolic and diastolic CHF (congestive heart failure) (HCC) [I50.43] Acute on chronic respiratory failure with hypoxia (HCC) [J96.21]   Acute Kidney Injury on chronic kidney disease stage IIIb with baseline creatinine 1.50 and GFR of 38 on 06/16/22.  Acute kidney injury secondary to cardiorenal syndrome and diuretic therapy Chronic kidney disease is secondary to hypertension and CHF.  No IV contrast exposure.   Patient AKI is worsening Patient creatinine was earlier stable at 2.3--2.5. Patient AKI had worsened patient creatinine had increased to 2.99/3.  We then reduced patient's diuretics Patient creatinine is now at 2.8    Lab Results  Component Value Date   CREATININE 2.88 (H) 08/03/2022   CREATININE 2.77 (H) 08/02/2022   CREATININE 2.99 (H) 08/01/2022    Intake/Output Summary (Last 24 hours) at 08/03/2022 1610 Last data filed at 08/03/2022 1552 Gross per 24 hour  Intake 157.84 ml  Output 377 ml  Net -219.16 ml     2. Anemia of chronic kidney disease Lab Results  Component Value Date   HGB 9.6 (L) 08/03/2022    Hemoglobin at goal  3.  Hypertension with chronic kidney disease.  Home regimen includes torsemide, amiodarone, hydralazine, and isosorbide. Continue Midodrine 5mg  three times daily for BP support.   Patient is currently on torsemide  4.  Pleural effusions, recurrent.   Patient is being followed by pulmonary Patient pleural effusion is worsening Primary team is following for possible need of Pleurx     5.Acute on Chronic Combined systolic and diastolic CHF Patient CT scan does not show an increasing intravascular condition but rather increasing pleural effusion Patient was hypoxic again last night Cardiology consult now following as well Patient remains on oral torsemide and milrinone drip   6.Social Patient has poor prognosis because of multiorgan disease process.  Patient is being followed by primary team and palliative care team has been consulted as well   7.  Acute respiratory failure Patient respiratory failure is multifactorial. Patient  has acute on chronic combined systolic and diastolic CHF Patient has pleural effusion Patient has history of adenocarcinoma of right lung Patient is currently in ICU  Plan  We will continue patient on oral torsemide, IV milrinone and spironolactone Patient is to have thoracentesis  to help with Pleural effusion/shortness of breath    LOS: 16 Tracia Lacomb s Moyinoluwa Dawe 10/20/20234:10 PM

## 2022-08-03 NOTE — Plan of Care (Signed)
Continuing with plan of care. 

## 2022-08-03 NOTE — Progress Notes (Signed)
OT Cancellation Note  Patient Details Name: Danielle [name] MRN: [id number] DOB: 03-08-1956   Cancelled Treatment:    Reason Eval/Treat Not Completed: Patient not medically ready. Chart reviewed. Pt noted with transfer to ICU previous date 2/2 change in status. Will sign off at this time due to higher level of care required. Please re-consult therapy once pt is medically appropriate.   Ardeth Perfect., MPH, MS, OTR/L ascom [phone] 08/03/22, 2:15 PM

## 2022-08-03 NOTE — Progress Notes (Signed)
Patient continues on HFNC needing an increase earlier in the shift to 70% due to increased work of breathing and unable to wean since this increase. Patient continues on milrinone gtt and Lasix gtt has been discontinued.  Patient now has orders for morphine prn to alleviate continued shortness of breath.  Urine output is low with a total of 271mL this shift and Dr. Roosevelt Locks is aware, patient also continues to be followed by nephrology. Will continue to monitor.

## 2022-08-04 DIAGNOSIS — J9621 Acute and chronic respiratory failure with hypoxia: Secondary | ICD-10-CM | POA: Diagnosis not present

## 2022-08-04 DIAGNOSIS — N189 Chronic kidney disease, unspecified: Secondary | ICD-10-CM | POA: Diagnosis not present

## 2022-08-04 DIAGNOSIS — N179 Acute kidney failure, unspecified: Secondary | ICD-10-CM | POA: Diagnosis not present

## 2022-08-04 DIAGNOSIS — I5043 Acute on chronic combined systolic (congestive) and diastolic (congestive) heart failure: Secondary | ICD-10-CM | POA: Diagnosis not present

## 2022-08-04 LAB — BASIC METABOLIC PANEL
Anion gap: 11 (ref 5–15)
BUN: 79 mg/dL — ABNORMAL HIGH (ref 8–23)
CO2: 27 mmol/L (ref 22–32)
Calcium: 9 mg/dL (ref 8.9–10.3)
Chloride: 99 mmol/L (ref 98–111)
Creatinine, Ser: 3.39 mg/dL — ABNORMAL HIGH (ref 0.44–1.00)
GFR, Estimated: 14 mL/min — ABNORMAL LOW (ref >60)
Glucose, Bld: 102 mg/dL — ABNORMAL HIGH (ref 70–99)
Potassium: 3.7 mmol/L (ref 3.5–5.1)
Sodium: 137 mmol/L (ref 135–145)

## 2022-08-04 NOTE — Plan of Care (Signed)
  Problem: Cardiac: Goal: Ability to achieve and maintain adequate cardiopulmonary perfusion will improve Outcome: Not Progressing   Problem: Clinical Measurements: Goal: Ability to maintain clinical measurements within normal limits will improve Outcome: Not Progressing Goal: Respiratory complications will improve Outcome: Not Progressing

## 2022-08-04 NOTE — Progress Notes (Addendum)
[facility]  PROGRESS NOTE   Subjective:   Patient seen at bedside.  Awake and alert.  Objective:  Vital signs: Blood pressure 113/76, pulse 93, temperature 100.2 F (37.9 C), temperature source Oral, resp. rate (!) 23, height 5\' 6"  (1.676 m), weight 50.1 kg, SpO2 94 %.  Intake/Output Summary (Last 24 hours) at 08/04/2022 1107 Last data filed at 08/04/2022 0700 Gross per 24 hour  Intake 233.29 ml  Output 391 ml  Net -157.71 ml   Filed Weights   08/02/22 1500 08/03/22 0400 08/04/22 0545  Weight: 54.8 kg 50.4 kg 50.1 kg     Physical Exam: General:  No acute distress  Head:  Normocephalic, atraumatic. Moist oral mucosal membranes  Eyes:  Anicteric  Neck:  Supple  Lungs:   Clear to auscultation, normal effort  Heart:  S1S2 no rubs  Abdomen:   Soft, nontender, bowel sounds present  Extremities:  peripheral edema.  Neurologic:  Awake, alert, following commands  Skin:  No lesions  Access:     Basic Metabolic Panel: Recent Labs  Lab 07/31/22 0425 08/01/22 0440 08/02/22 0356 08/03/22 0353 08/04/22 0523  NA 141 139 139 138 137  K 3.9 4.2 3.6 4.0 3.7  CL 103 101 103 100 99  CO2 27 26 27 26 27   GLUCOSE 119* 95 96 102* 102*  BUN 63* 70* 76* 73* 79*  CREATININE 2.48* 2.99* 2.77* 2.88* 3.39*  CALCIUM 9.1 8.9 8.9 9.1 9.0  MG  --   --  2.0 2.0  --     CBC: Recent Labs  Lab 07/28/22 1410 07/31/22 0425 08/03/22 0353  WBC 6.6 5.5 5.6  NEUTROABS 4.7  --   --   HGB 10.4* 9.4* 9.6*  HCT 32.8* 29.5* 29.7*  MCV 97.9 97.4 95.2  PLT 230 223 241     Urinalysis: No results for input(s): "COLORURINE", "LABSPEC", "PHURINE", "GLUCOSEU", "HGBUR", "BILIRUBINUR", "KETONESUR", "PROTEINUR", "UROBILINOGEN", "NITRITE", "LEUKOCYTESUR" in the last 72 hours.  Invalid input(s): "APPERANCEUR"    Imaging: No results found.   Medications:    sodium chloride     milrinone 0.25 mcg/kg/min (08/04/22 0700)    amiodarone  200 mg Oral Daily   atorvastatin  40 mg  Oral Daily   Chlorhexidine Gluconate Cloth  6 each Topical Daily   citalopram  20 mg Oral Daily   feeding supplement  1 Container Oral TID BM   metoprolol tartrate  25 mg Oral Q8H   multivitamin with minerals  1 tablet Oral Daily   pantoprazole  40 mg Oral BID AC   polyethylene glycol  17 g Oral Daily   sodium chloride flush  3 mL Intravenous Q12H   spironolactone  12.5 mg Oral Daily   torsemide  20 mg Oral Daily    Assessment/ Plan:     Principal Problem:   Acute on chronic combined systolic and diastolic CHF (congestive heart failure) (HCC) Active Problems:   Carcinoma of overlapping sites of left breast in female, estrogen receptor positive ([place])   Acute on chronic respiratory failure with hypoxia ([place])   Acute kidney injury superimposed on CKD ([place])   Depression   CKD (chronic kidney disease), stage IV (HCC)   Chronic obstructive pulmonary disease (HCC)   Hypotension   Hypomagnesemia   Anemia of chronic disease   Abdominal pain   HLD (hyperlipidemia)   Paroxysmal atrial fibrillation (HCC)   Essential hypertension   Recurrent right pleural effusion   Malignant neoplasm of upper lobe of right lung  Prolonged QT interval   Esophageal dysmotility   Moderate aortic stenosis   Moderate tricuspid regurgitation by prior echocardiogram  Danielle [name] is a 66 y.o.  female with past medical conditions including diabetes, hypertension, lymphedema, breast cancer COPD and chronic kidney disease.  Patient presents to the emergency department of shortness of breath and has been admitted for Pleural effusion [J90] Status post thoracentesis [Z98.890] CHF exacerbation ([place]) [I50.9] Pleural effusion on right [J90] Acute on chronic combined systolic and diastolic CHF (congestive heart failure) (HCC) [I50.43] Acute on chronic respiratory failure with hypoxia ([place]) [J96.21]  #1: Acute kidney injury: Patient has worsening renal insufficiency secondary to possible cardiorenal syndrome.   She was on IV furosemide drip which was discontinued.  Presently she is on torsemide along with spironolactone and the dose has been reduced.  Renal indices are still high.  We will continue to monitor closely.  #2: Anemia: Patient has anemia secondary to chronic kidney disease.  We will continue to monitor closely.  #3: Hypertension: Patient is advised on importance of 2 g salt restricted diet.  We will continue the metoprolol along with the diuretics at a lower dose.  #4: Congestive heart failure: Patient is advised on the importance of fluid restriction.  We will continue the milrinone drip.  We will continue the torsemide and small dose of spironolactone.  #5: Lung cancer/respiratory failure/pleural effusion: Patient had a history of thoracentesis.  She may need thoracentesis again.  Oncology follow-up as outpatient.  #6: Metabolic acidosis: Acidosis resolved.  We will discontinue sodium bicarbonate.  Overall prognosis is poor.  We will continue to monitor.   LOS: [place], [facility] 10/21/202311:07 AM

## 2022-08-04 NOTE — Progress Notes (Signed)
SUBJECTIVE: Patient with PMH of atrial fibrillation, chronic HF, right lung cancer, chronic kidney disease, AV stenosis, COPD, left breast cancer, esophageal cancer, and recurrent right sided pleural effusion requiring thoracentesis.   Patient laying comfortably in bed. Complains of shortness of breath, occasional chest pain under left breast, weakness and fatigue.   Vitals:   08/04/22 1200 08/04/22 1300 08/04/22 1350 08/04/22 1400  BP: 108/78 123/85  (!) 108/90  Pulse: 87 84  91  Resp: (!) 21 (!) 22  (!) 21  Temp: 98.9 F (37.2 C)     TempSrc: Axillary     SpO2: 90% 90% 90% (!) 89%  Weight:      Height:        Intake/Output Summary (Last 24 hours) at 08/04/2022 1414 Last data filed at 08/04/2022 1300 Gross per 24 hour  Intake 313.04 ml  Output 360 ml  Net -46.96 ml    LABS: Basic Metabolic Panel: Recent Labs    08/02/22 0356 08/03/22 0353 08/04/22 0523  NA 139 138 137  K 3.6 4.0 3.7  CL 103 100 99  CO2 27 26 27   GLUCOSE 96 102* 102*  BUN 76* 73* 79*  CREATININE 2.77* 2.88* 3.39*  CALCIUM 8.9 9.1 9.0  MG 2.0 2.0  --    Liver Function Tests: No results for input(s): "AST", "ALT", "ALKPHOS", "BILITOT", "PROT", "ALBUMIN" in the last 72 hours. No results for input(s): "LIPASE", "AMYLASE" in the last 72 hours. CBC: Recent Labs    08/03/22 0353  WBC 5.6  HGB 9.6*  HCT 29.7*  MCV 95.2  PLT 241   Cardiac Enzymes: No results for input(s): "CKTOTAL", "CKMB", "CKMBINDEX", "TROPONINI" in the last 72 hours. BNP: Invalid input(s): "POCBNP" D-Dimer: No results for input(s): "DDIMER" in the last 72 hours. Hemoglobin A1C: No results for input(s): "HGBA1C" in the last 72 hours. Fasting Lipid Panel: No results for input(s): "CHOL", "HDL", "LDLCALC", "TRIG", "CHOLHDL", "LDLDIRECT" in the last 72 hours. Thyroid Function Tests: No results for input(s): "TSH", "T4TOTAL", "T3FREE", "THYROIDAB" in the last 72 hours.  Invalid input(s): "FREET3" Anemia Panel: No results  for input(s): "VITAMINB12", "FOLATE", "FERRITIN", "TIBC", "IRON", "RETICCTPCT" in the last 72 hours.   PHYSICAL EXAM General: Well developed, well nourished, in no acute distress HEENT:  Normocephalic and atramatic Neck:  No JVD.  Lungs: Clear bilaterally to auscultation and percussion. Heart: HRRR . Normal S1 and S2 without gallops or murmurs.  Abdomen: Bowel sounds are positive, abdomen soft and non-tender  Msk:  Back normal, normal gait. Normal strength and tone for age. Extremities: No clubbing, cyanosis or edema.   Neuro: Alert and oriented X 3. Psych:  Good affect, responds appropriately  TELEMETRY: atrial fibrillation, HR 90's-100's  ASSESSMENT AND PLAN: Patient presented to ED on 08/09/2022 with complaints of shortness of breath. Thoracentesis 07/19/22. Pleurx catheter scheduled for Monday. Will continue current plan.   Principal Problem:   Acute on chronic combined systolic and diastolic CHF (congestive heart failure) (HCC) Active Problems:   Carcinoma of overlapping sites of left breast in female, estrogen receptor positive ([place])   Acute on chronic respiratory failure with hypoxia (HCC)   Acute kidney injury superimposed on CKD (HCC)   Depression   CKD (chronic kidney disease), stage IV (HCC)   Chronic obstructive pulmonary disease (HCC)   Hypotension   Hypomagnesemia   Anemia of chronic disease   Abdominal pain   HLD (hyperlipidemia)   Paroxysmal atrial fibrillation (HCC)   Essential hypertension   Recurrent right pleural effusion  Malignant neoplasm of upper lobe of right lung   Prolonged QT interval   Esophageal dysmotility   Moderate aortic stenosis   Moderate tricuspid regurgitation by prior echocardiogram    Danielle Anspaugh, FNP-C 08/04/2022 2:14 PM

## 2022-08-04 NOTE — Progress Notes (Signed)
Progress Note   Patient: Danielle [name] FGH:[id number] DOB: Mar 22, 1956 DOA: 07/25/2022     17 DOS: the patient was seen and examined on 08/04/2022   Brief hospital course: Danielle [name] is a 66 y.o. female with medical history significant for Paroxysmal A-fib on chronic anticoagulation therapy, history of chronic systolic heart failure, history of adenocarcinoma of the right lung status post radiation therapy, history of stage IIIb chronic kidney disease, aortic valve stenosis, COPD with chronic respiratory failure on 4 L of oxygen continuous, history of left breast cancer status post mastectomy, history of esophageal cancer recurrent right-sided pleural effusion requiring thoracentesis, most recently on 06/19/2022 who presented to the ER on 07/27/2022 for evaluation of worsening shortness of breath with associated chest tightness.    ED course and evaluation --- BP 135/99 with RR 29 with O2 sat 90% on increased O2 flow rate of 5 L/min.  Labs with troponin 15>>17, BNP 2935.  Treated with IV Lasix in the ED.  Chest x-ray showed -- Small right pleural effusion, increased since prior study.  Increasing right basilar consolidation, which may reflect airspace disease or progressive atelectasis.  Mass-like linear consolidation right suprahilar region, unchanged since recent CT.  Patient was admitted to hospitalist service and IV diuresis was continued.    IR was consulted for thoracentesis which was done on 10/5 with 900 cc fluid removed.   Hospital course complicated by: --Acute kidney injury after diuresis due to cardiorenal physiology.  Nephrology was consulted.   Renal function improved with diuretics held, but pt with persistent and worsening exertional dyspnea again. --Abdominal pain for which evaluation including abdominal xray and CT abdomen/pelvis was unremarkable. --Swallowing difficulties with severe esophageal dysmotility discovered on barium esophagram.    10/19.  Patient developed  worsening hypoxemia, currently on high flow oxygen.  We will place a Pleurx catheter as patient pleural effusion has reaccumulated, planned on Monday. Also restarted Lasix drip with milrinone after cardiology consult. 10/20.  Lasix discontinued as patient renal function is getting worse.     Assessment and Plan: Acute on chronic combined systolic and diastolic CHF (congestive heart failure) (HCC) Acute on chronic respiratory failure with hypoxia (HCC) Moderate aortic stenosis, probably severe aortic stenosis due to reduced ejection fraction. Moderate to severe tricuspid regurgitation. Moderate pulm hypertension 2021, probably severe now. Echocardiogram performed on 01/2022 showed ejection from 35 to 40%, also showed moderate aortic stenosis, moderate to severe tricuspid regurgitation.  The echocardiogram performed 2021, patient also had moderate pulm hypertension, she probably had severe pulm hypertension now. Patient is transferred to stepdown unit on 10/19, started on milrinone and IV Lasix drip. However, patient did not diuresing well with Lasix drip, still significant short of breath and hypoxemia, renal functions are worse, she developed more tachycardia with atrial fibrillation.   Renal function is continued to get worse today, discussed with nephrology, may consider dialysis.  Patient still has secondary volume overload, may require hemodialysis to sustain her life. Discussed with patient again, she has discussed with her daughter, Danielle [name] states to be full code. For now, continue milrinone. Also discontinued her anticoagulation to plan for Pleurx catheter placement on Monday.   Acute kidney injury on chronic kidney disease stage IIIb. Renal function continued to get worse, patient has reduced urine output.  Nephrology is aware.   Transient hypotension No recurrence.    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   Recurrent right pleural effusion. Last thoracentesis was 10/5 with 900 mm fluid removed.  Reviewed her previous cytology results from thoracentesis, no malignancy identified. Pleurx catheter is scheduled on Monday.   Dysphagia secondary to esophageal dysmotility  Followed by speech therapy.   Prolonged QT interval. Avoid medication that can further prolong QT interval, monitor electrolytes.   Paroxysmal atrial fibrillation with rapid ventricular response. Continue increased metoprolol at 25 mg at every 8 hours.   Severe COPD  CT chest showed severe emphysema with large bullae.    Prognosis. Patient condition is terminal, I do not believe her life can be suspended.  She has multiorgan failure, specifically, systolic congestive heart failure, likely severe pulm hypertension, severe aortic stenosis, severe tricuspid regurgitation.  She also has severe COPD.  Renal function also getting worse. I do not believe patient can survive longer than 2 weeks. After discussion again today, patient insisted to be full code.  She still want to get all the treatment she can get.      Subjective:  Patient had another episode of severe shortness of breath, she was placed n.p.o. for risk of aspiration due to severe shortness of breath. Currently, she is on 45% oxygen, still short of breath at rest.  No cough.  I restarted her diet as she can better control her airway.  Physical Exam: Vitals:   08/04/22 1200 08/04/22 1300 08/04/22 1350 08/04/22 1400  BP: 108/78 123/85  (!) 108/90  Pulse: 87 84  91  Resp: (!) 21 (!) 22  (!) 21  Temp: 98.9 F (37.2 C)     TempSrc: Axillary     SpO2: 90% 90% 90% (!) 89%  Weight:      Height:       General exam: Appears calm and comfortable  Respiratory system: Decreased breathing sounds on right lower lobe, crackles in left lower lobe.[name]  Respiratory effort normal. Cardiovascular system: Irregular and tachycardic. No JVD, murmurs, rubs, gallops or clicks. No pedal edema. Gastrointestinal system: Abdomen is nondistended, soft and nontender. No organomegaly or masses felt. Normal bowel sounds heard. Central nervous system: Alert and oriented x3. No focal neurological deficits. Extremities: Symmetric 5 x 5 power. Skin: No rashes, lesions or ulcers Psychiatry: Judgement and insight appear normal. Mood & affect appropriate.   Data Reviewed:  Lab results reviewed.  Family Communication:   Disposition: Status is: Inpatient Remains inpatient appropriate because: Severity of disease, IV treatments.  Planned Discharge Destination:  TBD    Time spent: 55 minutes  Author: Sharen Hones, MD 08/04/2022 2:36 PM  For on call review [email or web address].

## 2022-08-05 DIAGNOSIS — N189 Chronic kidney disease, unspecified: Secondary | ICD-10-CM | POA: Diagnosis not present

## 2022-08-05 DIAGNOSIS — I5043 Acute on chronic combined systolic (congestive) and diastolic (congestive) heart failure: Secondary | ICD-10-CM | POA: Diagnosis not present

## 2022-08-05 DIAGNOSIS — I1310 Hypertensive heart and chronic kidney disease without heart failure, with stage 1 through stage 4 chronic kidney disease, or unspecified chronic kidney disease: Secondary | ICD-10-CM

## 2022-08-05 DIAGNOSIS — N179 Acute kidney failure, unspecified: Secondary | ICD-10-CM | POA: Diagnosis not present

## 2022-08-05 DIAGNOSIS — J9621 Acute and chronic respiratory failure with hypoxia: Secondary | ICD-10-CM | POA: Diagnosis not present

## 2022-08-05 LAB — CBC
HCT: 30.1 % — ABNORMAL LOW (ref 36.0–46.0)
Hemoglobin: 9.8 g/dL — ABNORMAL LOW (ref 12.0–15.0)
MCH: 31.3 pg (ref 26.0–34.0)
MCHC: 32.6 g/dL (ref 30.0–36.0)
MCV: 96.2 fL (ref 80.0–100.0)
Platelets: 239 10*3/uL (ref 150–400)
RBC: 3.13 MIL/uL — ABNORMAL LOW (ref 3.87–5.11)
RDW: 15.8 % — ABNORMAL HIGH (ref 11.5–15.5)
WBC: 6.9 10*3/uL (ref 4.0–10.5)
nRBC: 0 % (ref 0.0–0.2)

## 2022-08-05 LAB — BASIC METABOLIC PANEL
Anion gap: 11 (ref 5–15)
BUN: 83 mg/dL — ABNORMAL HIGH (ref 8–23)
CO2: 28 mmol/L (ref 22–32)
Calcium: 8.9 mg/dL (ref 8.9–10.3)
Chloride: 97 mmol/L — ABNORMAL LOW (ref 98–111)
Creatinine, Ser: 3.38 mg/dL — ABNORMAL HIGH (ref 0.44–1.00)
GFR, Estimated: 14 mL/min — ABNORMAL LOW (ref >60)
Glucose, Bld: 96 mg/dL (ref 70–99)
Potassium: 4.7 mmol/L (ref 3.5–5.1)
Sodium: 136 mmol/L (ref 135–145)

## 2022-08-05 LAB — MAGNESIUM: Magnesium: 2.2 mg/dL (ref 1.7–2.4)

## 2022-08-05 LAB — GLUCOSE, CAPILLARY: Glucose-Capillary: 122 mg/dL — ABNORMAL HIGH (ref 70–99)

## 2022-08-05 MED ORDER — SENNOSIDES-DOCUSATE SODIUM 8.6-50 MG PO TABS
2.0000 | ORAL_TABLET | Freq: Two times a day (BID) | ORAL | Status: DC
  Administered 2022-08-06 – 2022-08-19 (×11): 2 via ORAL
  Filled 2022-08-05 (×15): qty 2

## 2022-08-05 NOTE — Progress Notes (Signed)
Progress Note   Patient: Danielle [name] YQM:[id number] DOB: 1956/03/10 DOA: 07/23/2022     18 DOS: the patient was seen and examined on 08/05/2022   Brief hospital course: Danielle [name] is a 66 y.o. female with medical history significant for Paroxysmal A-fib on chronic anticoagulation therapy, history of chronic systolic heart failure, history of adenocarcinoma of the right lung status post radiation therapy, history of stage IIIb chronic kidney disease, aortic valve stenosis, COPD with chronic respiratory failure on 4 L of oxygen continuous, history of left breast cancer status post mastectomy, history of esophageal cancer recurrent right-sided pleural effusion requiring thoracentesis, most recently on 06/19/2022 who presented to the ER on 07/28/2022 for evaluation of worsening shortness of breath with associated chest tightness.    ED course and evaluation --- BP 135/99 with RR 29 with O2 sat 90% on increased O2 flow rate of 5 L/min.  Labs with troponin 15>>17, BNP 2935.  Treated with IV Lasix in the ED.  Chest x-ray showed -- Small right pleural effusion, increased since prior study.  Increasing right basilar consolidation, which may reflect airspace disease or progressive atelectasis.  Mass-like linear consolidation right suprahilar region, unchanged since recent CT.  Patient was admitted to hospitalist service and IV diuresis was continued.    IR was consulted for thoracentesis which was done on 10/5 with 900 cc fluid removed.   Hospital course complicated by: --Acute kidney injury after diuresis due to cardiorenal physiology.  Nephrology was consulted.   Renal function improved with diuretics held, but pt with persistent and worsening exertional dyspnea again. --Abdominal pain for which evaluation including abdominal xray and CT abdomen/pelvis was unremarkable. --Swallowing difficulties with severe esophageal dysmotility discovered on barium esophagram.    10/19.  Patient developed  worsening hypoxemia, currently on high flow oxygen.  We will place a Pleurx catheter as patient pleural effusion has reaccumulated, planned on Monday. Also restarted Lasix drip with milrinone after cardiology consult. 10/20.  Lasix discontinued as patient renal function is getting worse.     Assessment and Plan: Acute on chronic combined systolic and diastolic CHF (congestive heart failure) (HCC) Acute on chronic respiratory failure with hypoxia (HCC) Moderate aortic stenosis, probably severe aortic stenosis due to reduced ejection fraction. Moderate to severe tricuspid regurgitation. Moderate pulm hypertension 2021, probably severe now. Echocardiogram performed on 01/2022 showed ejection from 35 to 40%, also showed moderate aortic stenosis, moderate to severe tricuspid regurgitation.  The echocardiogram performed 2021, patient also had moderate pulm hypertension, she probably had severe pulm hypertension now. Patient is transferred to stepdown unit on 10/19, started on milrinone and IV Lasix drip. However, patient did not diuresing well with Lasix drip, still significant short of breath and hypoxemia, renal functions are worse, she developed more tachycardia with atrial fibrillation.   Patient currently on 55% of heated high flow oxygen.  Still significant short of breath with minimal exertion.  Continue milrinone. Discussed with nephrology, will place dialysis catheter tomorrow for ultrafiltration. Patient also scheduled to have Pleurx catheter placed tomorrow.   Acute kidney injury on chronic kidney disease stage IIIb. Cardiorenal syndrome. Appreciated nephrology consult, dialysis tomorrow.   Transient hypotension No recurrence.    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   Recurrent right pleural  effusion. Last thoracentesis was 10/5 with 900 mm fluid removed.  Reviewed her previous cytology  results from thoracentesis, no malignancy identified. Pleurx catheter is scheduled on Monday.   Dysphagia secondary to esophageal dysmotility  Followed by speech therapy.   Prolonged QT interval. Avoid medication that can further prolong QT interval, monitor electrolytes.   Paroxysmal atrial fibrillation with rapid ventricular response. Continue current dose of beta-blocker.   Severe COPD  CT chest showed severe emphysema with large bullae.       Subjective:  Patient still high flow oxygen, short of breath with min exertion.  No cough.  Physical Exam: Vitals:   08/05/22 0900 08/05/22 1000 08/05/22 1011 08/05/22 1100  BP: 111/76 92/68  99/68  Pulse: 100 (!) 116 (!) 118 (!) 107  Resp: 15 18 17 16   Temp:      TempSrc:      SpO2: 91% (!) 85% 92% 92%  Weight:      Height:       General exam: Ill-appearing  Respiratory system: Crackles in the base bilaterally. Respiratory effort normal. Cardiovascular system: Irregularly irregular, tachycardic.  No JVD, murmurs, rubs, gallops or clicks. No pedal edema. Gastrointestinal system: Abdomen is nondistended, soft and nontender. No organomegaly or masses felt. Normal bowel sounds heard. Central nervous system: Alert and oriented x3. No focal neurological deficits. Extremities: Symmetric 5 x 5 power. Skin: No rashes, lesions or ulcers Psychiatry:  Mood & affect appropriate.   Data Reviewed:  Lab results reviewed.  Family Communication: daughter updated.   Disposition: Status is: Inpatient Remains inpatient appropriate because: Severity of disease, IV treatment, ICU management.  Planned Discharge Destination:  TBD    Time spent: 50 minutes  Author: Sharen Hones, MD 08/05/2022 1:25 PM  For on call review [email or web address].

## 2022-08-05 NOTE — Progress Notes (Signed)
[facility]  PROGRESS NOTE   Subjective:   Awake and alert, still complains of trouble breathing. Has been on milrinone drip  Objective:  Vital signs: Blood pressure (!) 109/51, pulse (!) 110, temperature 98.3 F (36.8 C), temperature source Oral, resp. rate 19, height 5\' 6"  (1.676 m), weight 49.5 kg, SpO2 90 %.  Intake/Output Summary (Last 24 hours) at 08/05/2022 0959 Last data filed at 08/05/2022 0618 Gross per 24 hour  Intake 291.08 ml  Output 365 ml  Net -73.92 ml   Filed Weights   08/03/22 0400 08/04/22 0545 08/05/22 0500  Weight: 50.4 kg 50.1 kg 49.5 kg     Physical Exam: General:  No acute distress  Head:  Normocephalic, atraumatic. Moist oral mucosal membranes  Eyes:  Anicteric  Neck:  Supple  Lungs:  Bilateral wheeze.  Heart:  S1S2 no rubs  Abdomen:   Soft, nontender, bowel sounds present  Extremities: 1+ peripheral edema.  Neurologic:  Awake, alert, following commands  Skin:  No lesions  Access:     Basic Metabolic Panel: Recent Labs  Lab 08/01/22 0440 08/02/22 0356 08/03/22 0353 08/04/22 0523 08/05/22 0326  NA 139 139 138 137 136  K 4.2 3.6 4.0 3.7 4.7  CL 101 103 100 99 97*  CO2 26 27 26 27 28   GLUCOSE 95 96 102* 102* 96  BUN 70* 76* 73* 79* 83*  CREATININE 2.99* 2.77* 2.88* 3.39* 3.38*  CALCIUM 8.9 8.9 9.1 9.0 8.9  MG  --  2.0 2.0  --  2.2    CBC: Recent Labs  Lab 07/31/22 0425 08/03/22 0353 08/05/22 0326  WBC 5.5 5.6 6.9  HGB 9.4* 9.6* 9.8*  HCT 29.5* 29.7* 30.1*  MCV 97.4 95.2 96.2  PLT 223 241 239     Urinalysis: No results for input(s): "COLORURINE", "LABSPEC", "PHURINE", "GLUCOSEU", "HGBUR", "BILIRUBINUR", "KETONESUR", "PROTEINUR", "UROBILINOGEN", "NITRITE", "LEUKOCYTESUR" in the last 72 hours.  Invalid input(s): "APPERANCEUR"    Imaging: No results found.   Medications:    sodium chloride 5 mL/hr at 08/04/22 1900   milrinone 0.25 mcg/kg/min (08/04/22 1900)    amiodarone  200 mg Oral Daily    atorvastatin  40 mg Oral Daily   Chlorhexidine Gluconate Cloth  6 each Topical Daily   citalopram  20 mg Oral Daily   feeding supplement  1 Container Oral TID BM   metoprolol tartrate  25 mg Oral Q8H   multivitamin with minerals  1 tablet Oral Daily   pantoprazole  40 mg Oral BID AC   polyethylene glycol  17 g Oral Daily   sodium chloride flush  3 mL Intravenous Q12H   spironolactone  12.5 mg Oral Daily   torsemide  20 mg Oral Daily    Assessment/ Plan:     Principal Problem:   Acute on chronic combined systolic and diastolic CHF (congestive heart failure) (HCC) Active Problems:   Carcinoma of overlapping sites of left breast in female, estrogen receptor positive ([place])   Acute on chronic respiratory failure with hypoxia ([place])   Acute kidney injury superimposed on CKD ([place])   Depression   CKD (chronic kidney disease), stage IV (HCC)   Chronic obstructive pulmonary disease (HCC)   Hypotension   Hypomagnesemia   Anemia of chronic disease   Abdominal pain   HLD (hyperlipidemia)   Paroxysmal atrial fibrillation (HCC)   Essential hypertension   Recurrent right pleural effusion   Malignant neoplasm of upper lobe of right lung   Prolonged QT interval  Esophageal dysmotility   Moderate aortic stenosis   Moderate tricuspid regurgitation by prior echocardiogram   Cardiorenal syndrome with renal failure  [name] is a 66 y.o.  female with past medical conditions including diabetes, hypertension, lymphedema, breast cancer COPD and chronic kidney disease.  Patient presents to the emergency department of shortness of breath and has been admitted for Pleural effusion [J90] Status post thoracentesis [Z98.890] CHF exacerbation ([place]) [I50.9] Pleural effusion on right [J90] Acute on chronic combined systolic and diastolic CHF (congestive heart failure) (HCC) [I50.43] Acute on chronic respiratory failure with hypoxia ([place]) [J96.21]   #1: Acute kidney injury: Patient has worsening  renal insufficiency secondary to possible cardiorenal syndrome.  She was on IV furosemide drip which was discontinued.  Presently she is on torsemide along with spironolactone and the dose has been reduced.  Renal indices are worsening and she may need to be initiated on renal replacement therapy.  Spoke to the patient at length and she is agreeable to do so possibly tomorrow.     #2: Anemia: Patient has anemia secondary to chronic kidney disease.  We will continue to monitor closely.   #3: Hypertension: Patient is advised on importance of 2 g salt restricted diet.  We will continue the metoprolol along with the diuretics at a lower doses.   #4: Congestive heart failure: Patient is advised on the importance of fluid restriction.  We will continue the milrinone drip.  We will continue the torsemide and small dose of spironolactone.   #5: Lung cancer/respiratory failure/pleural effusion: Patient had a history of thoracentesis.  She may need thoracentesis again.  Oncology follow-up as outpatient.   #6: Metabolic acidosis: Acidosis resolved.  We will discontinue sodium bicarbonate.   Overall prognosis is poor.  We will continue to monitor.   LOS: [place], [facility] 10/22/20239:59 AM

## 2022-08-05 NOTE — Progress Notes (Signed)
SUBJECTIVE: Patient with PMH of atrial fibrillation, chronic HF, right lung cancer, chronic kidney disease, AV stenosis, COPD, left breast cancer, esophageal cancer, and recurrent right sided pleural effusion requiring thoracentesis.    Patient laying comfortably in bed. Complains of shortness of breath, occasional chest pain under left breast, weakness and fatigue.   Vitals:   08/05/22 0900 08/05/22 1000 08/05/22 1011 08/05/22 1100  BP: 111/76 92/68  99/68  Pulse: 100 (!) 116 (!) 118 (!) 107  Resp: 15 18 17 16   Temp:      TempSrc:      SpO2: 91% (!) 85% 92% 92%  Weight:      Height:        Intake/Output Summary (Last 24 hours) at 08/05/2022 1206 Last data filed at 08/05/2022 1100 Gross per 24 hour  Intake 317.94 ml  Output 340 ml  Net -22.06 ml    LABS: Basic Metabolic Panel: Recent Labs    08/03/22 0353 08/04/22 0523 08/05/22 0326  NA 138 137 136  K 4.0 3.7 4.7  CL 100 99 97*  CO2 26 27 28   GLUCOSE 102* 102* 96  BUN 73* 79* 83*  CREATININE 2.88* 3.39* 3.38*  CALCIUM 9.1 9.0 8.9  MG 2.0  --  2.2   Liver Function Tests: No results for input(s): "AST", "ALT", "ALKPHOS", "BILITOT", "PROT", "ALBUMIN" in the last 72 hours. No results for input(s): "LIPASE", "AMYLASE" in the last 72 hours. CBC: Recent Labs    08/03/22 0353 08/05/22 0326  WBC 5.6 6.9  HGB 9.6* 9.8*  HCT 29.7* 30.1*  MCV 95.2 96.2  PLT 241 239   Cardiac Enzymes: No results for input(s): "CKTOTAL", "CKMB", "CKMBINDEX", "TROPONINI" in the last 72 hours. BNP: Invalid input(s): "POCBNP" D-Dimer: No results for input(s): "DDIMER" in the last 72 hours. Hemoglobin A1C: No results for input(s): "HGBA1C" in the last 72 hours. Fasting Lipid Panel: No results for input(s): "CHOL", "HDL", "LDLCALC", "TRIG", "CHOLHDL", "LDLDIRECT" in the last 72 hours. Thyroid Function Tests: No results for input(s): "TSH", "T4TOTAL", "T3FREE", "THYROIDAB" in the last 72 hours.  Invalid input(s): "FREET3" Anemia  Panel: No results for input(s): "VITAMINB12", "FOLATE", "FERRITIN", "TIBC", "IRON", "RETICCTPCT" in the last 72 hours.   PHYSICAL EXAM General: Well developed, well nourished, in no acute distress HEENT:  Normocephalic and atramatic Neck:  No JVD.  Lungs: Clear bilaterally to auscultation and percussion. Heart: HRRR . Normal S1 and S2 without gallops or murmurs.  Abdomen: Bowel sounds are positive, abdomen soft and non-tender  Msk:  Back normal, normal gait. Normal strength and tone for age. Extremities: No clubbing, cyanosis or edema.   Neuro: Alert and oriented X 3. Psych:  Good affect, responds appropriately  TELEMETRY: atrial fibrillation, HR 90's-100's  ASSESSMENT AND PLAN: Patient presented to ED on 07/16/2022 with complaints of shortness of breath. Thoracentesis 07/19/22. IV furosemide discontinued due to worsening renal function. Currently on torsemide, spironolactone. Pleurx catheter scheduled for Monday. Will continue current plan.   Principal Problem:   Acute on chronic combined systolic and diastolic CHF (congestive heart failure) (HCC) Active Problems:   Carcinoma of overlapping sites of left breast in female, estrogen receptor positive ([place])   Acute on chronic respiratory failure with hypoxia (HCC)   Acute kidney injury superimposed on CKD (HCC)   Depression   CKD (chronic kidney disease), stage IV (HCC)   Chronic obstructive pulmonary disease (HCC)   Hypotension   Hypomagnesemia   Anemia of chronic disease   Abdominal pain   HLD (hyperlipidemia)  Paroxysmal atrial fibrillation (HCC)   Essential hypertension   Recurrent right pleural effusion   Malignant neoplasm of upper lobe of right lung   Prolonged QT interval   Esophageal dysmotility   Moderate aortic stenosis   Moderate tricuspid regurgitation by prior echocardiogram   Cardiorenal syndrome with renal failure    Darrius Montano, FNP-C 08/05/2022 12:06 PM

## 2022-08-06 DIAGNOSIS — J90 Pleural effusion, not elsewhere classified: Secondary | ICD-10-CM | POA: Diagnosis not present

## 2022-08-06 DIAGNOSIS — N179 Acute kidney failure, unspecified: Secondary | ICD-10-CM | POA: Diagnosis not present

## 2022-08-06 DIAGNOSIS — J9621 Acute and chronic respiratory failure with hypoxia: Secondary | ICD-10-CM | POA: Diagnosis not present

## 2022-08-06 DIAGNOSIS — I5043 Acute on chronic combined systolic (congestive) and diastolic (congestive) heart failure: Secondary | ICD-10-CM | POA: Diagnosis not present

## 2022-08-06 LAB — HEPATITIS B SURFACE ANTIGEN: Hepatitis B Surface Ag: NONREACTIVE

## 2022-08-06 LAB — BASIC METABOLIC PANEL
Anion gap: 11 (ref 5–15)
BUN: 92 mg/dL — ABNORMAL HIGH (ref 8–23)
CO2: 27 mmol/L (ref 22–32)
Calcium: 8.9 mg/dL (ref 8.9–10.3)
Chloride: 97 mmol/L — ABNORMAL LOW (ref 98–111)
Creatinine, Ser: 4.21 mg/dL — ABNORMAL HIGH (ref 0.44–1.00)
GFR, Estimated: 11 mL/min — ABNORMAL LOW (ref >60)
Glucose, Bld: 106 mg/dL — ABNORMAL HIGH (ref 70–99)
Potassium: 5.1 mmol/L (ref 3.5–5.1)
Sodium: 135 mmol/L (ref 135–145)

## 2022-08-06 LAB — HEPATITIS B CORE ANTIBODY, TOTAL: Hep B Core Total Ab: NONREACTIVE

## 2022-08-06 MED ORDER — LACTULOSE 10 GM/15ML PO SOLN
20.0000 g | Freq: Once | ORAL | Status: AC
  Administered 2022-08-06: 20 g via ORAL
  Filled 2022-08-06: qty 30

## 2022-08-06 MED ORDER — LIDOCAINE-PRILOCAINE 2.5-2.5 % EX CREA: 1.0000 | TOPICAL_CREAM | CUTANEOUS | Status: DC | PRN

## 2022-08-06 MED ORDER — LIDOCAINE HCL (PF) 1 % IJ SOLN: 5.0000 mL | INTRAMUSCULAR | Status: DC | PRN

## 2022-08-06 MED ORDER — ANTICOAGULANT SODIUM CITRATE 4% (200MG/5ML) IV SOLN: 5.0000 mL | Status: DC | PRN

## 2022-08-06 MED ORDER — PENTAFLUOROPROP-TETRAFLUOROETH EX AERO: 1.0000 | INHALATION_SPRAY | CUTANEOUS | Status: DC | PRN

## 2022-08-06 MED ORDER — HEPARIN SODIUM (PORCINE) 1000 UNIT/ML DIALYSIS: 1000.0000 [IU] | INTRAMUSCULAR | Status: DC | PRN

## 2022-08-06 MED ORDER — ALTEPLASE 2 MG IJ SOLR: 2.0000 mg | Freq: Once | INTRAMUSCULAR | Status: DC | PRN

## 2022-08-06 NOTE — Progress Notes (Signed)
[facility] Encounter Note  Patient: Danielle [name] / Admit Date: 07/30/2022 / Date of Encounter: 08/06/2022, 4:42 PM   Subjective: No particular significant change in condition at this time.  Patient still has decreased breath sounds in the bases with severe hypoxia requiring high flow air with chronic kidney disease with glomerular filtration rate of 15 slightly improved from days prior LV systolic dysfunction with ejection fraction of 35% continue to reoccurrence of right pleural effusion multifactorial in nature chronic nonvalvular atrial fibrillation with heart rate control and no apparent significant ability to fully remedy all of the above.  Management has been difficult but patient is stable at this time.  Review of Systems: Positive for: Shortness of breath Negative for: Vision change, hearing change, syncope, dizziness, nausea, vomiting,diarrhea, bloody stool, stomach pain, cough, congestion, diaphoresis, urinary frequency, urinary pain,skin lesions, skin rashes Others previously listed  Objective: Telemetry: Sinus tachycardia Physical Exam: Blood pressure 103/64, pulse 91, temperature 98.7 F (37.1 C), resp. rate 20, height 5\' 6"  (1.676 m), weight 49.5 kg, SpO2 91 %. Body mass index is 17.61 kg/m. General: Well developed, well nourished, in no acute distress. Head: Normocephalic, atraumatic, sclera non-icteric, no xanthomas, nares are without discharge. Neck: No apparent masses Lungs: Normal respirations with differential differential Fuhs wheezes,, some rhonchi, no rales , basilar crackles decreased right breath sounds  Heart: Regular rate and rhythm, normal S1 S2, no murmur, no rub, no gallop, PMI is normal size and placement, carotid upstroke normal without bruit, jugular venous pressure normal Abdomen: Soft, non-tender, non-distended with normoactive bowel sounds. No hepatosplenomegaly. Abdominal aorta is normal size without bruit Extremities: Trace  edema, no clubbing, no cyanosis, no ulcers,  Peripheral: 2+ radial, 2+ femoral, 2+ dorsal pedal pulses Neuro: Alert and oriented. Moves all extremities spontaneously. Psych:  Responds to questions appropriately with a normal affect.   Intake/Output Summary (Last 24 hours) at 08/06/2022 1642 Last data filed at 08/06/2022 1128 Gross per 24 hour  Intake 176.81 ml  Output --  Net 176.81 ml    Inpatient Medications:   amiodarone  200 mg Oral Daily   atorvastatin  40 mg Oral Daily   Chlorhexidine Gluconate Cloth  6 each Topical Daily   citalopram  20 mg Oral Daily   feeding supplement  1 Container Oral TID BM   metoprolol tartrate  25 mg Oral Q8H   multivitamin with minerals  1 tablet Oral Daily   pantoprazole  40 mg Oral BID AC   polyethylene glycol  17 g Oral Daily   senna-docusate  2 tablet Oral BID   sodium chloride flush  3 mL Intravenous Q12H   spironolactone  12.5 mg Oral Daily   torsemide  20 mg Oral Daily   Infusions:   sodium chloride 5 mL/hr at 08/06/22 1128   anticoagulant sodium citrate     milrinone 0.25 mcg/kg/min (08/06/22 1128)    Labs: Recent Labs    08/05/22 0326 08/06/22 0431  NA 136 135  K 4.7 5.1  CL 97* 97*  CO2 28 27  GLUCOSE 96 106*  BUN 83* 92*  CREATININE 3.38* 4.21*  CALCIUM 8.9 8.9  MG 2.2  --    No results for input(s): "AST", "ALT", "ALKPHOS", "BILITOT", "PROT", "ALBUMIN" in the last 72 hours. Recent Labs    08/05/22 0326  WBC 6.9  HGB 9.8*  HCT 30.1*  MCV 96.2  PLT 239   No results for input(s): "CKTOTAL", "CKMB", "TROPONINI" in the last 72 hours. Invalid input(s): "  POCBNP" No results for input(s): "HGBA1C" in the last 72 hours.   Weights: Filed Weights   08/03/22 0400 08/04/22 0545 08/05/22 0500  Weight: 50.4 kg 50.1 kg 49.5 kg     Radiology/Studies:  DG Chest Port 1 View  Result Date: 08/02/2022 CLINICAL DATA:  Shortness of breath. EXAM: PORTABLE CHEST 1 VIEW COMPARISON:  Chest radiograph dated 08/01/2022.  FINDINGS: Diffuse interstitial prominence may represent edema or developing infiltrate. Similar appearance of right pleural effusion and right lung base atelectasis or infiltrate. There is no pneumothorax. Stable cardiac silhouette. Atherosclerotic calcification of the aorta. No acute osseous pathology. IMPRESSION: Similar appearance of right pleural effusion and right lung base atelectasis or infiltrate. Electronically Signed   By: Anner Crete M.D.   On: 08/02/2022 00:36   CT CHEST WO CONTRAST  Result Date: 08/01/2022 CLINICAL DATA:  Chronic dyspnea. EXAM: CT CHEST WITHOUT CONTRAST TECHNIQUE: Multidetector CT imaging of the chest was performed following the standard protocol without IV contrast. RADIATION DOSE REDUCTION: This exam was performed according to the departmental dose-optimization program which includes automated exposure control, adjustment of the mA and/or kV according to patient size and/or use of iterative reconstruction technique. COMPARISON:  Recent chest CT 07/20/2022 FINDINGS: Cardiovascular: The heart is within normal limits in size and stable. Stable advanced atherosclerotic calcification involving the aorta and coronary arteries. Mediastinum/Nodes: Stable scattered mediastinal and hilar lymph nodes but no mass or overt adenopathy. The esophagus is grossly normal. Lungs/Pleura: Persistent and slightly larger right pleural effusion with overlying atelectasis. Stable probable post treatment changes involving the right upper lobe with dense areas of masslike fibrotic changes. Recommend clinical correlation. Stable severe underlying emphysematous changes and pulmonary scarring. Stable 8 mm superior segment left lower lobe pulmonary nodule on image number 66/3. This has increased in size since the prior study from May 2023 and worrisome for a metastatic focus. Upper Abdomen: No significant upper abdominal findings. Stable severe vascular disease. Stable left hepatic lobe cyst.  Musculoskeletal: Status post left mastectomy. No axillary adenopathy. The thyroid gland is grossly normal. The bony thorax is intact. IMPRESSION: 1. Persistent and slightly larger right pleural effusion with overlying atelectasis. 2. Stable probable post treatment changes involving the right upper lobe with dense areas of masslike fibrotic changes. 3. Stable severe emphysematous changes and pulmonary scarring. 4. 8 mm superior segment left lower lobe pulmonary nodule enlarged since May of 2023 and worrisome for a metastatic focus. 5. Stable advanced atherosclerotic calcification involving the aorta and coronary arteries. 6. Aortic atherosclerosis. Aortic Atherosclerosis (ICD10-I70.0) and Emphysema (ICD10-J43.9). Electronically Signed   By: Marijo Sanes M.D.   On: 08/01/2022 15:46   DG Chest 2 View  Result Date: 08/01/2022 CLINICAL DATA:  Acute on chronic congestive heart failure. EXAM: CHEST - 2 VIEW COMPARISON:  July 30, 2022. FINDINGS: Mild cardiomegaly is noted. Mild to moderate right pleural effusion is noted with associated atelectasis. Left lung is unremarkable. Bony thorax is unremarkable. IMPRESSION: Mild to moderate right pleural effusion is noted with associated atelectasis. Aortic Atherosclerosis (ICD10-I70.0). Electronically Signed   By: Marijo Conception M.D.   On: 08/01/2022 15:46   DG Chest Port 1 View  Result Date: 07/30/2022 CLINICAL DATA:  Shortness of breath. Pleural effusion. EXAM: PORTABLE CHEST 1 VIEW COMPARISON:  07/29/2022 FINDINGS: Stable enlarged cardiac silhouette, right pleural effusion, mildly prominent interstitial markings, right upper lung zone linear atelectasis or scarring and ill-defined opacity in the right lower lung zone. IMPRESSION: 1. Stable cardiomegaly and mild interstitial lung disease with possible superimposed  mild interstitial pulmonary edema. 2. Stable right pleural effusion. 3. Stable right upper lung zone linear atelectasis or scarring and increased  density in the right lower lung zone due to the pleural effusion with possible atelectasis or pneumonia. Electronically Signed   By: Claudie Revering M.D.   On: 07/30/2022 11:41   DG Chest Port 1 View  Result Date: 07/29/2022 CLINICAL DATA:  Dyspnea EXAM: PORTABLE CHEST 1 VIEW COMPARISON:  07/28/2022 FINDINGS: Unchanged appearance of right hilum and parahilar masslike opacity. Mild interstitial opacity without overt edema. Cardiomediastinal contours are normal. Suspect layering right basilar effusion. IMPRESSION: 1. Unchanged appearance of right hilum and parahilar masslike opacity. 2. Suspect layering right basilar effusion. Electronically Signed   By: Ulyses Jarred M.D.   On: 07/29/2022 21:07   DG Chest Port 1 View  Result Date: 07/28/2022 CLINICAL DATA:  Shortness of breath, COPD EXAM: PORTABLE CHEST 1 VIEW COMPARISON:  07/26/2022 FINDINGS: Unchanged AP portable examination. Cardiomegaly with a small right pleural effusion and perihilar masslike consolidation. Mild, diffuse bilateral interstitial pulmonary opacity. Osseous structures unremarkable. IMPRESSION: 1. Unchanged AP portable examination. Cardiomegaly with a small right pleural effusion and perihilar masslike consolidation. 2. Mild, diffuse bilateral interstitial pulmonary opacity, likely edema. No new or focal airspace opacity. Electronically Signed   By: Delanna Ahmadi M.D.   On: 07/28/2022 12:22   DG Chest Port 1 View  Result Date: 07/26/2022 CLINICAL DATA:  Pleural effusion, shortness of breath EXAM: PORTABLE CHEST 1 VIEW COMPARISON:  07/23/2022 FINDINGS: Right pleural effusion is similar to prior study. Right lower lobe airspace opacities also stable. No confluent opacity on the left. Heart is normal size. Aortic atherosclerosis. No acute bony abnormality. IMPRESSION: Stable small right pleural effusion with right lower lobe airspace opacities. Electronically Signed   By: Rolm Baptise M.D.   On: 07/26/2022 14:26   DG Chest Port 1  View  Result Date: 07/23/2022 CLINICAL DATA:  Pleural effusion. EXAM: PORTABLE CHEST 1 VIEW COMPARISON:  Chest radiograph 07/19/2022 FINDINGS: Multiple monitoring leads overlie the patient. Stable cardiomegaly. Aortic atherosclerosis. There appears to be worsening layering right pleural effusion with increased underlying opacities within the right lung. Similar-appearing heterogeneous opacities right upper lung. Similar appearance of the left lung. No pneumothorax. Thoracic spine degenerative changes. IMPRESSION: Worsening layering right pleural effusion with underlying opacities which may represent atelectasis or infection. Electronically Signed   By: Lovey Newcomer M.D.   On: 07/23/2022 11:39   DG ESOPHAGUS W SINGLE CM (SOL OR THIN BA)  Result Date: 07/21/2022 CLINICAL DATA:  Patient admitted for worsening shortness of breath. Dysphagia, with solids and liquids sticking in chest. EXAM: ESOPHAGUS/BARIUM SWALLOW/TABLET STUDY TECHNIQUE: Single contrast examination was performed using thin liquid barium. This exam was performed by Narda Rutherford, NP, and was supervised and interpreted by Earle Gell, MD. FLUOROSCOPY: Radiation Exposure Index (as provided by the fluoroscopic device): 70.10 mGy Kerma COMPARISON:  CT AP dated 07/20/2022 FINDINGS: Limited examination due to patient's inability to stand and assume routine positions on fluoroscopy table. Exam was also limited by patient's inability to perform bolus swallowing severe nausea. Swallowing: No vestibular penetration or aspiration seen. Pharynx: Unremarkable. Esophagus: Patient was only able to drink a small amount of barium. This shows severe esophageal dysmotility and stasis of contrast in the esophagus. There was poor filling of the distal esophagus, and patient could not be placed in semi-erect or standing position. However, some barium does pass through the esophagus into the stomach, and no obstructing mass or stricture is identified. Hiatal  Hernia: None.  Ingested 93mm barium tablet: Not given Other: None. IMPRESSION: Technically limited exam as discussed above. Severe esophageal dysmotility. No evidence of esophageal obstruction. Electronically Signed   By: Marlaine Hind M.D.   On: 07/21/2022 16:36   CT CHEST ABDOMEN PELVIS WO CONTRAST  Result Date: 07/20/2022 CLINICAL DATA:  Abdominal pain, RIGHT lower quadrant pain radiating to LEFT side, acute kidney injury, question sepsis. History CHF, lung cancer, breast cancer, atrial fibrillation, COPD, former smoker EXAM: CT CHEST, ABDOMEN AND PELVIS WITHOUT CONTRAST TECHNIQUE: Multidetector CT imaging of the chest, abdomen and pelvis was performed following the standard protocol without IV contrast. Patient drank dilute oral contrast for exam. RADIATION DOSE REDUCTION: This exam was performed according to the departmental dose-optimization program which includes automated exposure control, adjustment of the mA and/or kV according to patient size and/or use of iterative reconstruction technique. COMPARISON:  CT angio chest 06/16/2022, CT abdomen and pelvis 05/08/2022 FINDINGS: CT CHEST FINDINGS Cardiovascular: Extensive atherosclerotic calcifications aorta, proximal great vessels, and coronary arteries. Aorta normal caliber. Mild enlargement of cardiac chambers. No pericardial effusion. Mediastinum/Nodes: Esophagus unremarkable. Base of cervical region normal appearance. Prior LEFT mastectomy. No adenopathy. Lungs/Pleura: Small RIGHT pleural effusion. Emphysematous changes with chronic opacity RIGHT upper lobe with spiculated margins, 2.6 x 1.8 cm unchanged. Additional masslike area of opacity in the anterior RIGHT upper lobe extending to the pleural surface measures 4.1 x 1.9 cm, unchanged. Mild compressive atelectasis of RIGHT lower lobe. Subpleural scarring anterolateral LEFT upper lobe suspect related to prior radiation therapy to mastectomy bed, unchanged. No infiltrate identified. Nodular density posteromedial LEFT  lower lobe 9 x 6 mm previously 7 x 4 mm, cannot exclude metastasis. Minimal dependent atelectasis LEFT lower lobe. Musculoskeletal: Osseous demineralization without focal bone lesion. CT ABDOMEN PELVIS FINDINGS Hepatobiliary: Question cyst anterior LEFT lobe liver 11 mm unchanged. Additional 6 mm nonspecific low-attenuation focus RIGHT lobe liver image 68 unchanged. Gallbladder and liver otherwise normal appearance. Pancreas: Normal appearance Spleen: Normal appearance Adrenals/Urinary Tract: Adrenal glands, kidneys, ureters, and bladder normal appearance Stomach/Bowel: Stomach unremarkable. Diverticulosis of descending and sigmoid colon without evidence of diverticulitis. Remaining bowel loops unremarkable. Appendix not visualized. Vascular/Lymphatic: Extensive atherosclerotic calcifications without aneurysm. Significant atherosclerotic calcifications at the origins of the superior mesenteric and celiac arteries. No adenopathy. Reproductive: Atrophic uterus and adnexa with question small exophytic leiomyoma from anterior uterine wall 18 mm diameter tubal ligation clips. Other: No free air or free fluid. No hernia or inflammatory process. Musculoskeletal: Osseous demineralization. Chronic mild superior endplate compression deformities of L2 and L4. IMPRESSION: Small RIGHT pleural effusion with compressive atelectasis of RIGHT lower lobe, slightly decreased. Stable masslike opacities in the RIGHT upper lobe question related to treated tumor. Slight increase in size of a LEFT lower lobe nodule 9 x 6 mm, cannot exclude metastasis. Distal colonic diverticulosis without evidence of diverticulitis. No acute intra-abdominal or intrapelvic abnormalities. Extensive atherosclerotic calcifications including coronary arteries. Aortic Atherosclerosis (ICD10-I70.0) and Emphysema (ICD10-J43.9). Electronically Signed   By: Lavonia Dana M.D.   On: 07/20/2022 19:05   DG Abd 1 View  Result Date: 07/20/2022 CLINICAL DATA:  Right  lower quadrant pain EXAM: ABDOMEN - 1 VIEW COMPARISON:  06/20/2022 FINDINGS: The bowel gas pattern is normal. No radio-opaque calculi or other significant radiographic abnormality are seen. Tubal ligation clips are noted. Aortoiliac atherosclerosis. IMPRESSION: Negative. Electronically Signed   By: Davina Poke D.O.   On: 07/20/2022 15:09   US THORACENTESIS ASP PLEURAL SPACE W/IMG GUIDE  Result Date: 07/19/2022 INDICATION: Patient with  history of chronic kidney disease, atrial fibrillation, congestive heart failure and right sided lung cancer status post radiation with recurrent right-sided pleural effusion. EXAM: ULTRASOUND GUIDED RIGHT THORACENTESIS MEDICATIONS: Local 1% lidocaine only. COMPLICATIONS: None immediate. PROCEDURE: An ultrasound guided thoracentesis was thoroughly discussed with the patient and questions answered. The benefits, risks, alternatives and complications were also discussed. The patient understands and wishes to proceed with the procedure. Written consent was obtained. Ultrasound was performed to localize and mark an adequate pocket of fluid in the right chest. The area was then prepped and draped in the normal sterile fashion. 1% Lidocaine was used for local anesthesia. Under ultrasound guidance a 19 gauge, 7-cm, Yueh catheter was introduced. Thoracentesis was performed. The catheter was removed and a dressing applied. FINDINGS: A total of approximately 900 mL of amber colored fluid was removed. IMPRESSION: Successful ultrasound guided right thoracentesis yielding 900 mL of pleural fluid. This exam was performed by Tsosie Billing PA-C, and was supervised and interpreted by Dr. Earleen Newport. Electronically Signed   By: Corrie Mckusick D.O.   On: 07/19/2022 11:43   DG Chest Port 1 View  Result Date: 07/19/2022 CLINICAL DATA:  Post thoracentesis. EXAM: PORTABLE CHEST 1 VIEW COMPARISON:  Radiographs 08/14/2022 and 06/19/2022.  CT 06/16/2022. FINDINGS: 1121 hours. The right pleural effusion  appears decreased in volume. There is improved aeration of the right lung base. No evidence of pneumothorax. The heart size and mediastinal contours are stable. The right suprahilar lesion appears grossly stable. No edema or new airspace disease identified. The bones appear unchanged. Telemetry leads overlie the chest. IMPRESSION: Decreased right pleural effusion and improved aeration of the right lung base following thoracentesis. No evidence of pneumothorax. Electronically Signed   By: Richardean Sale M.D.   On: 07/19/2022 11:39   DG Chest Port 1 View  Result Date: 07/27/2022 CLINICAL DATA:  Short of breath, left breast cancer EXAM: PORTABLE CHEST 1 VIEW COMPARISON:  06/19/2022, 06/16/2022 FINDINGS: Single frontal view of the chest demonstrates stable enlargement of the cardiac silhouette. There is a small right pleural effusion, increased since prior study. Increased consolidation at the right lung base may reflect atelectasis or airspace disease. Stable linear consolidation right suprahilar region. No pneumothorax. No acute bony abnormality. IMPRESSION: 1. Small right pleural effusion, increased since prior study. 2. Increasing right basilar consolidation, which may reflect airspace disease or progressive atelectasis. 3. Masslike linear consolidation right suprahilar region, unchanged since recent CT. Electronically Signed   By: Randa Ngo M.D.   On: 07/30/2022 18:32     Assessment and Recommendation  66 y.o. female with known paroxysmal nonvalvular atrial fibrillation chronic systolic dysfunction congestive heart failure chronic kidney disease stage IV aortic stenosis COPD recurrent right pleural effusion multifactorial in nature needing continued medication management and stabilization of all above 1.  Okay to continue amiodarone at 200 mg each day for maintenance of normal sinus rhythm with telemetry currently showing sinus tachycardia 2.  Okay for continuation of milrinone for acute on chronic  systolic dysfunction congestive heart failure but will discontinue when patient has more stabilization kidney dysfunction and congestive heart failure symptoms 3.  Heparin for further risk reduction in stroke with atrial fibrillation currently in normal sinus rhythm 4.  No further cardiac diagnostics necessary at this time 5.  Patient okay for pleural evacuation catheter as necessary for recurrent pleural effusion 6.  Diuresis as per nephrology for recurrent pulmonary edema pleural effusion and worsening chronic kidney disease with consideration of further intervention as necessary 7.  Continuation of metoprolol for heart rate control and cardiomyopathy 8.  Further adjustments of medication management and treatment options after above  Signed, Serafina Royals M.D. FACC

## 2022-08-06 NOTE — Progress Notes (Signed)
[facility]  PROGRESS NOTE   Subjective:   Awake and alert, still complains of trouble breathing. Has been on milrinone drip  Objective:  Vital signs: Blood pressure 99/83, pulse 98, temperature 98.2 F (36.8 C), resp. rate 15, height 5\' 6"  (1.676 m), weight 49.5 kg, SpO2 97 %.  Intake/Output Summary (Last 24 hours) at 08/06/2022 1104 Last data filed at 08/05/2022 1900 Gross per 24 hour  Intake 72.88 ml  Output 50 ml  Net 22.88 ml    Filed Weights   08/03/22 0400 08/04/22 0545 08/05/22 0500  Weight: 50.4 kg 50.1 kg 49.5 kg     Physical Exam: General:  No acute distress  Head:  Normocephalic, atraumatic. Moist oral mucosal membranes  Eyes:  Anicteric  Neck:  Supple  Lungs:  Bilateral wheeze.  Also has crackles bilaterally.  High flow nasal cannula oxygen supplementation  Heart:  S1S2 no rubs  Abdomen:   Soft, nontender, bowel sounds present  Extremities: 1+ peripheral edema.  Neurologic:  Awake, alert, following commands  Skin:  No lesions  Access:     Basic Metabolic Panel: Recent Labs  Lab 08/02/22 0356 08/03/22 0353 08/04/22 0523 08/05/22 0326 08/06/22 0431  NA 139 138 137 136 135  K 3.6 4.0 3.7 4.7 5.1  CL 103 100 99 97* 97*  CO2 27 26 27 28 27   GLUCOSE 96 102* 102* 96 106*  BUN 76* 73* 79* 83* 92*  CREATININE 2.77* 2.88* 3.39* 3.38* 4.21*  CALCIUM 8.9 9.1 9.0 8.9 8.9  MG 2.0 2.0  --  2.2  --      CBC: Recent Labs  Lab 07/31/22 0425 08/03/22 0353 08/05/22 0326  WBC 5.5 5.6 6.9  HGB 9.4* 9.6* 9.8*  HCT 29.5* 29.7* 30.1*  MCV 97.4 95.2 96.2  PLT 223 241 239      Urinalysis: No results for input(s): "COLORURINE", "LABSPEC", "PHURINE", "GLUCOSEU", "HGBUR", "BILIRUBINUR", "KETONESUR", "PROTEINUR", "UROBILINOGEN", "NITRITE", "LEUKOCYTESUR" in the last 72 hours.  Invalid input(s): "APPERANCEUR"    Imaging: No results found.   Medications:    sodium chloride 5 mL/hr at 08/05/22 1900   milrinone 0.25 mcg/kg/min (08/05/22  1900)    amiodarone  200 mg Oral Daily   atorvastatin  40 mg Oral Daily   Chlorhexidine Gluconate Cloth  6 each Topical Daily   citalopram  20 mg Oral Daily   feeding supplement  1 Container Oral TID BM   metoprolol tartrate  25 mg Oral Q8H   multivitamin with minerals  1 tablet Oral Daily   pantoprazole  40 mg Oral BID AC   polyethylene glycol  17 g Oral Daily   senna-docusate  2 tablet Oral BID   sodium chloride flush  3 mL Intravenous Q12H   spironolactone  12.5 mg Oral Daily   torsemide  20 mg Oral Daily    Assessment/ Plan:     Principal Problem:   Acute on chronic combined systolic and diastolic CHF (congestive heart failure) (HCC) Active Problems:   Carcinoma of overlapping sites of left breast in female, estrogen receptor positive ([place])   Acute on chronic respiratory failure with hypoxia ([place])   Acute kidney injury superimposed on CKD ([place])   Depression   CKD (chronic kidney disease), stage IV (HCC)   Chronic obstructive pulmonary disease (HCC)   Hypotension   Hypomagnesemia   Anemia of chronic disease   Abdominal pain   HLD (hyperlipidemia)   Paroxysmal atrial fibrillation ([place])   Essential hypertension   Recurrent right  pleural effusion   Malignant neoplasm of upper lobe of right lung   Prolonged QT interval   Esophageal dysmotility   Moderate aortic stenosis   Moderate tricuspid regurgitation by prior echocardiogram   Cardiorenal syndrome with renal failure  [name] is a 66 y.o.  female with past medical conditions including diabetes, hypertension, lymphedema, breast cancer COPD and chronic kidney disease.  Patient presents to the emergency department of shortness of breath and has been admitted for Pleural effusion [J90] Status post thoracentesis [Z98.890] CHF exacerbation ([place]) [I50.9] Pleural effusion on right [J90] Acute on chronic combined systolic and diastolic CHF (congestive heart failure) (HCC) [I50.43] Acute on chronic respiratory  failure with hypoxia ([place]) [J96.21]   #1: Acute kidney injury on chronic kidney disease stage IIIb.  Baseline creatinine 1.50/GFR 38 from 06/16/2022.: Patient has worsening renal insufficiency secondary to possible cardiorenal syndrome.  She was on IV furosemide drip which was discontinued.  Presently she is on torsemide along with spironolactone and the dose has been reduced.  Renal indices are worsening and she may need to be initiated on renal replacement therapy.  Spoke to the patient at length and she is agreeable to do so. Plan for access-tunneled dialysis catheter would be preferred but with patient's tenuous respiratory status, it may not be immediately feasible.  We will await Pleurx catheter placement tomorrow by IR.  If breathing improves after that, we will request tunneled dialysis catheter, otherwise we will request temporary dialysis catheter.   #2: Anemia secondary to chronic kidney disease.  We will continue to monitor closely. Lab Results  Component Value Date   HGB 9.8 (L) 08/05/2022      #3: Lung cancer/respiratory failure/pleural effusion: Patient had a history of thoracentesis.  She may need thoracentesis again.  Oncology follow-up as outpatient.    Overall prognosis is poor.  We will continue to monitor.   LOS: [place], MD [facility] 10/23/202311:04 AM

## 2022-08-06 NOTE — Consult Note (Signed)
Chief Complaint: Patient was seen in consultation today for  Chief Complaint  Patient presents with   Shortness of Breath    Referring Physician(s): Dr. Roosevelt Locks  Supervising Physician: Juliet Rude  Patient Status: Danielle [name] - In-pt  History of Present Illness: Danielle [name] is a 66 y.o. female with a medical history significant for CHF, paroxysmal atrial fibrillation, aortic valve stenosis, chronic kidney disease IIIb, COPD requiring home O2, left breast cancer s/p mastectomy, esophageal cancer and adenocarcinoma of the right lung s/p radiation therapy. She has a recurrent right-sided pleural effusion which causes significant hypoxia.   Interventional Radiology has been asked to evaluate this patient for an image-guided tunneled pleural catheter for symptom management. Imaging reviewed and procedure approved by Dr. Denna Haggard.   Past Medical History:  Diagnosis Date   Acute respiratory failure due to COVID-19 ([place]) 09/27/2019   Anemia    Anxiety    Aortic atherosclerosis ([place])    Aortic valve stenosis 02/10/2018   a.) TTE 02/10/2018: EF 55-60%: mild AS with MPG of 12 mmHg. b.) TTE 04/21/2018: EF 35-40%; mild AS with MPG 8 mmHg. c.) TTE 11/07/2019: EF 50-55%; mild AS with MPG 10 mmHg.   Arthritis    Atrial fibrillation (HCC)    a.) CHA2DS2-VASc = 4 (age, sex, HTN, aortic plaque). b.) Rate/rhythm maintained on oral carvedilol; chronically anticoagulated on dose reduced apixaban. c.)  Attempted deployment of LAA occlusive device on 01/11/2021; parameters failed and procedure aborted.   Breast cancer, left ([place]) 2000   a.) T2N1M0; ER/PR (+) --> Tx'd with total mastectomy, LN resection, XRT, and chemotherapy   Cancer of right lung ([place]) 07/30/2016   a.) adenocarcinoma; ALK, ROS1, PDL1, BRAF, EGFR all negative.   CHF (congestive heart failure) (HCC)    Chronic diastolic CHF (congestive heart failure) ([place]) 11/26/2021   CKD (chronic kidney disease), stage IV (HCC)    COPD (chronic  obstructive pulmonary disease) (HCC)    Dependence on supplemental oxygen    Depression    Diastolic dysfunction 51/07/2110   a.) TTE 02/10/2017: EF 55-60%; G2DD. b.) TTE 04/21/2018: EF 35-40%; mild LA dilation; mod MV regurgitation. c.) TTE 11/07/2019: EF 50-55%; G1DD.   DOE (dyspnea on exertion)    GERD (gastroesophageal reflux disease)    Heart murmur    History of 2019 novel coronavirus disease (COVID-19) 10/14/2019   HLD (hyperlipidemia)    Hypertension    Long term current use of anticoagulant    a.) apixaban   Lymphedema    Personal history of chemotherapy    Personal history of radiation therapy    Respiratory tract infection due to COVID-19 virus 09/27/2019   SBO (small bowel obstruction) ([place]) 11/05/2020   Vitamin D deficiency     Past Surgical History:  Procedure Laterality Date   Breast Biospy Left    [facility]   BREAST SURGERY     CARDIOVERSION N/A 02/23/2022   Procedure: CARDIOVERSION;  Surgeon: Corey Skains, MD;  Location: [facility] ORS;  Service: Cardiovascular;  Laterality: N/A;   COLONOSCOPY N/A 04/30/2018   Procedure: COLONOSCOPY;  Surgeon: Virgel Manifold, MD;  Location: [facility] ENDOSCOPY;  Service: Endoscopy;  Laterality: N/A;   COLONOSCOPY N/A 07/22/2018   Procedure: COLONOSCOPY;  Surgeon: Virgel Manifold, MD;  Location: [facility] ENDOSCOPY;  Service: Endoscopy;  Laterality: N/A;   COLONOSCOPY WITH PROPOFOL N/A 09/21/2021   Procedure: COLONOSCOPY WITH PROPOFOL;  Surgeon: Benjamine Sprague, DO;  Location: [facility] ENDOSCOPY;  Service: General;  Laterality: N/A;   COLONOSCOPY WITH PROPOFOL  N/A 03/11/2022   Procedure: COLONOSCOPY WITH PROPOFOL;  Surgeon: Annamaria Helling, DO;  Location: [facility] ENDOSCOPY;  Service: Gastroenterology;  Laterality: N/A;   DILATION AND CURETTAGE OF UTERUS     ELECTROMAGNETIC NAVIGATION BROCHOSCOPY Right 04/11/2016   Procedure: ELECTROMAGNETIC NAVIGATION BRONCHOSCOPY;  Surgeon: Vilinda Boehringer, MD;  Location: [facility] ORS;  Service: Cardiopulmonary;   Laterality: Right;   ESOPHAGOGASTRODUODENOSCOPY N/A 07/22/2018   Procedure: ESOPHAGOGASTRODUODENOSCOPY (EGD);  Surgeon: Virgel Manifold, MD;  Location: [facility] ENDOSCOPY;  Service: Endoscopy;  Laterality: N/A;   ESOPHAGOGASTRODUODENOSCOPY (EGD) WITH PROPOFOL N/A 05/07/2018   Procedure: ESOPHAGOGASTRODUODENOSCOPY (EGD) WITH PROPOFOL;  Surgeon: Lucilla Lame, MD;  Location: [facility] ENDOSCOPY;  Service: Endoscopy;  Laterality: N/A;   ESOPHAGOGASTRODUODENOSCOPY (EGD) WITH PROPOFOL N/A 04/24/2019   Procedure: ESOPHAGOGASTRODUODENOSCOPY (EGD) WITH PROPOFOL;  Surgeon: Jonathon Bellows, MD;  Location: [facility] ENDOSCOPY;  Service: Gastroenterology;  Laterality: N/A;   ESOPHAGOGASTRODUODENOSCOPY (EGD) WITH PROPOFOL N/A 01/12/2020   Procedure: ESOPHAGOGASTRODUODENOSCOPY (EGD) WITH PROPOFOL;  Surgeon: Jonathon Bellows, MD;  Location: [facility] ENDOSCOPY;  Service: Gastroenterology;  Laterality: N/A;   ESOPHAGOGASTRODUODENOSCOPY (EGD) WITH PROPOFOL N/A 04/28/2020   Procedure: ESOPHAGOGASTRODUODENOSCOPY (EGD) WITH PROPOFOL;  Surgeon: Jonathon Bellows, MD;  Location: [facility] ENDOSCOPY;  Service: Gastroenterology;  Laterality: N/A;   ESOPHAGOGASTRODUODENOSCOPY (EGD) WITH PROPOFOL N/A 03/11/2022   Procedure: ESOPHAGOGASTRODUODENOSCOPY (EGD) WITH PROPOFOL;  Surgeon: Annamaria Helling, DO;  Location: [facility] ENDOSCOPY;  Service: Gastroenterology;  Laterality: N/A;   EUS N/A 05/07/2019   Procedure: FULL UPPER ENDOSCOPIC ULTRASOUND (EUS) RADIAL;  Surgeon: Jola Schmidt, MD;  Location: [facility] ENDOSCOPY;  Service: Endoscopy;  Laterality: N/A;   ILEOSCOPY N/A 07/22/2018   Procedure: ILEOSCOPY THROUGH STOMA;  Surgeon: Virgel Manifold, MD;  Location: [facility] ENDOSCOPY;  Service: Endoscopy;  Laterality: N/A;   ILEOSTOMY     ILEOSTOMY N/A 09/08/2018   Procedure: ILEOSTOMY REVISION POSSIBLE CREATION;  Surgeon: Herbert Pun, MD;  Location: [facility] ORS;  Service: General;  Laterality: N/A;   ILEOSTOMY CLOSURE N/A 08/15/2018   Procedure: DILATION  OF ILEOSTOMY STRICTURE;  Surgeon: Herbert Pun, MD;  Location: [facility] ORS;  Service: General;  Laterality: N/A;   LAPAROTOMY Right 05/04/2018   Procedure: EXPLORATORY LAPAROTOMY right colectomy right and left ostomy;  Surgeon: Herbert Pun, MD;  Location: [facility] ORS;  Service: General;  Laterality: Right;   LEFT ATRIAL APPENDAGE OCCLUSION N/A 01/11/2021   Procedure: LEFT ATRIAL APPENDAGE OCCLUSION ([place]); ABORTED PROCEDURE WITHOUT DEVICE BEING IMPLANTED; Location: [facility]; Surgeon: Mylinda Latina, MD   LUNG BIOPSY     MASTECTOMY Left    2000, [place]   ROTATOR CUFF REPAIR Right    [place]   XI ROBOTIC ASSISTED COLOSTOMY TAKEDOWN N/A 10/23/2021   Procedure: XI ROBOTIC ASSISTED ILEOSTOMY TAKEDOWN;  Surgeon: Herbert Pun, MD;  Location: [facility] ORS;  Service: General;  Laterality: N/A;  180 minutes for the surgery part please    Allergies: Patient has no known allergies.  Medications: Prior to Admission medications   Medication Sig Start Date End Date Taking? Authorizing Provider  acetaminophen (TYLENOL) 325 MG tablet Take 2 tablets (650 mg total) by mouth every 6 (six) hours as needed for mild pain (or Fever >/= 101). Patient taking differently: Take 650 mg by mouth every 4 (four) hours as needed for moderate pain. 12/24/18  Yes Gouru, Illene Silver, MD  albuterol (PROVENTIL HFA;VENTOLIN HFA) 108 (90 Base) MCG/ACT inhaler Inhale 2 puffs into the lungs every 6 (six) hours as needed for wheezing or shortness of breath. 04/24/18  Yes Demetrios Loll, MD  albuterol (PROVENTIL) (2.5 MG/3ML) 0.083% nebulizer solution Take  2.5 mg by nebulization daily as needed. 05/02/22  Yes [provider]  amiodarone (PACERONE) 200 MG tablet Take 1 tablet (200 mg total) by mouth daily. 06/21/22  Yes Lorella Nimrod, MD  apixaban (ELIQUIS) 2.5 MG TABS tablet Take 1 tablet (2.5 mg total) by mouth 2 (two) times daily. 05/26/22  Yes Wyvonnia Dusky, MD  atorvastatin (LIPITOR) 40 MG tablet Take 1 tablet  (40 mg total) by mouth daily. 02/03/22  Yes Nicole Kindred A, DO  calcium carbonate (TUMS - DOSED IN MG ELEMENTAL CALCIUM) 500 MG chewable tablet Chew 0.5 tablets by mouth daily as needed.   Yes [provider]  calcium-vitamin D (OSCAL WITH D) 500-5 MG-MCG tablet Take 1 tablet by mouth daily.   Yes [provider]  citalopram (CELEXA) 20 MG tablet Take 1 tablet (20 mg total) by mouth daily. 02/04/22  Yes Nicole Kindred A, DO  ferrous sulfate 325 (65 FE) MG tablet Take 325 mg by mouth 2 (two) times daily with a meal.   Yes [provider]  Fluticasone-Umeclidin-Vilant (TRELEGY ELLIPTA) 100-62.5-25 MCG/ACT AEPB Inhale 1 puff into the lungs daily. 03/05/22  Yes [provider]  hydrALAZINE (APRESOLINE) 10 MG tablet Take 1 tablet (10 mg total) by mouth 3 (three) times daily. 05/26/22  Yes Wyvonnia Dusky, MD  isosorbide dinitrate (ISORDIL) 5 MG tablet Take 1 tablet (5 mg total) by mouth 3 (three) times daily. 05/26/22  Yes Wyvonnia Dusky, MD  loperamide (IMODIUM) 2 MG capsule Take 2 mg by mouth daily as needed for diarrhea or loose stools.   Yes [provider]  metoprolol tartrate (LOPRESSOR) 25 MG tablet Take 1 tablet (25 mg total) by mouth 2 (two) times daily. Patient taking differently: Take 50 mg by mouth 2 (two) times daily. 02/03/22  Yes Ezekiel Slocumb, DO  Multiple Vitamin (MULTIVITAMIN WITH MINERALS) TABS tablet Take 1 tablet by mouth daily.   Yes [provider]  pantoprazole (PROTONIX) 40 MG tablet Take 1 tablet (40 mg total) by mouth 2 (two) times daily before a meal. 08/05/20  Yes Pokhrel, Laxman, MD  potassium chloride SA (KLOR-CON M) 20 MEQ tablet Take 2 tablets (40 mEq total) by mouth daily. Patient taking differently: Take 20 mEq by mouth 2 (two) times daily. 06/26/22  Yes Darylene Price A, FNP  predniSONE (DELTASONE) 5 MG tablet Take 5 mg by mouth daily with breakfast. 07/04/22  Yes [provider]  simethicone  (MYLICON) 80 MG chewable tablet Chew 1 tablet (80 mg total) by mouth every 6 (six) hours as needed for flatulence. 06/21/22  Yes Lorella Nimrod, MD  sodium bicarbonate 650 MG tablet Take 1,300 mg by mouth 3 (three) times daily.   Yes [provider]  Torsemide 40 MG TABS Take 20 mg by mouth daily. Hold this medication until you see your cardiologist Patient taking differently: Take 40 mg by mouth daily. 05/26/22  Yes Wyvonnia Dusky, MD  zolpidem (AMBIEN) 5 MG tablet Take 5 mg by mouth at bedtime as needed for sleep. 06/23/21  Yes [provider]  OXYGEN Inhale 4 L into the lungs daily.    [provider]  potassium chloride (KLOR-CON) 8 MEQ tablet Take by mouth. Patient not taking: Reported on 08/01/2022 07/05/22 07/05/23  [provider]  sucralfate (CARAFATE) 1 GM/10ML suspension Take 0.5 g by mouth 2 (two) times daily. Patient not taking: Reported on 07/22/2022    [provider]     Family History  Problem Relation  Age of Onset   Breast cancer Mother 78   Cancer Mother        Breast    Cirrhosis Father    Breast cancer Paternal Aunt 41   Cancer Maternal Aunt        Breast     Social History   Socioeconomic History   Marital status: Divorced    Spouse name: Not on file   Number of children: 3   Years of education: Not on file   Highest education level: Not on file  Occupational History   Occupation: Retired   Tobacco Use   Smoking status: Former    Packs/day: 0.50    Years: 20.00    Total pack years: 10.00    Types: Cigarettes    Quit date: 07/02/2012    Years since quitting: 10.1   Smokeless tobacco: Current    Types: Snuff  Vaping Use   Vaping Use: Never used  Substance and Sexual Activity   Alcohol use: Yes    Alcohol/week: 3.0 standard drinks of alcohol    Types: 3 Cans of beer per week    Comment: Occasionally beer   Drug use: No   Sexual activity: Not Currently  Other Topics Concern   Not on file  Social History  Narrative   Not on file   Social Determinants of Health   Financial Resource Strain: High Risk (07/05/2018)   Overall Financial Resource Strain (CARDIA)    Difficulty of Paying Living Expenses: Hard  Food Insecurity: No Food Insecurity (07/19/2022)   Hunger Vital Sign    Worried About Running Out of Food in the Last Year: Never true    Ran Out of Food in the Last Year: Never true  Transportation Needs: No Transportation Needs (07/19/2022)   PRAPARE - Hydrologist (Medical): No    Lack of Transportation (Non-Medical): No  Physical Activity: Insufficiently Active (07/05/2018)   Exercise Vital Sign    Days of Exercise per Week: 2 days    Minutes of Exercise per Session: 30 min  Stress: No Stress Concern Present (07/05/2018)   [place]    Feeling of Stress : Not at all  Social Connections: Somewhat Isolated (07/05/2018)   Social Connection and Isolation Panel [NHANES]    Frequency of Communication with Friends and Family: More than three times a week    Frequency of Social Gatherings with Friends and Family: More than three times a week    Attends Religious Services: 1 to 4 times per year    Active Member of [employer] or Organizations: No    Attends Archivist Meetings: Never    Marital Status: Divorced    Review of Systems: A 12 point ROS discussed and pertinent positives are indicated in the HPI above.  All other systems are negative.  Review of Systems  Constitutional:  Positive for fatigue.  Respiratory:  Positive for shortness of breath.   Cardiovascular:  Negative for chest pain.  Gastrointestinal:  Negative for abdominal pain, diarrhea, nausea and vomiting.  Genitourinary:  Positive for decreased urine volume.  Neurological:  Negative for dizziness and headaches.    Vital Signs: BP 92/67   Pulse (!) 120   Temp 98.2 F (36.8 C)   Resp 19   Ht _0  (1.676 m)   Wt 109 lb  2 oz (49.5 kg)   SpO2 91%   BMI 17.61 kg/m   Physical Exam  Constitutional:      General: She is not in acute distress.    Appearance: She is ill-appearing.  HENT:     Mouth/Throat:     Mouth: Mucous membranes are moist.     Pharynx: Oropharynx is clear.  Cardiovascular:     Pulses: Normal pulses.     Heart sounds: Normal heart sounds.  Pulmonary:     Comments: High flow nasal cannula  Decreased air movement with fine crackles on the right  Abdominal:     General: Bowel sounds are normal.     Palpations: Abdomen is soft.     Tenderness: There is no abdominal tenderness.  Skin:    General: Skin is warm and dry.  Neurological:     Mental Status: She is alert and oriented to person, place, and time.     Imaging: DG Chest Port 1 View  Result Date: 08/02/2022 CLINICAL DATA:  Shortness of breath. EXAM: PORTABLE CHEST 1 VIEW COMPARISON:  Chest radiograph dated 08/01/2022. FINDINGS: Diffuse interstitial prominence may represent edema or developing infiltrate. Similar appearance of right pleural effusion and right lung base atelectasis or infiltrate. There is no pneumothorax. Stable cardiac silhouette. Atherosclerotic calcification of the aorta. No acute osseous pathology. IMPRESSION: Similar appearance of right pleural effusion and right lung base atelectasis or infiltrate. Electronically Signed   By: Anner Crete M.D.   On: 08/02/2022 00:36   CT CHEST WO CONTRAST  Result Date: 08/01/2022 CLINICAL DATA:  Chronic dyspnea. EXAM: CT CHEST WITHOUT CONTRAST TECHNIQUE: Multidetector CT imaging of the chest was performed following the standard protocol without IV contrast. RADIATION DOSE REDUCTION: This exam was performed according to the departmental dose-optimization program which includes automated exposure control, adjustment of the mA and/or kV according to patient size and/or use of iterative reconstruction technique. COMPARISON:  Recent chest CT 07/20/2022 FINDINGS: Cardiovascular:  The heart is within normal limits in size and stable. Stable advanced atherosclerotic calcification involving the aorta and coronary arteries. Mediastinum/Nodes: Stable scattered mediastinal and hilar lymph nodes but no mass or overt adenopathy. The esophagus is grossly normal. Lungs/Pleura: Persistent and slightly larger right pleural effusion with overlying atelectasis. Stable probable post treatment changes involving the right upper lobe with dense areas of masslike fibrotic changes. Recommend clinical correlation. Stable severe underlying emphysematous changes and pulmonary scarring. Stable 8 mm superior segment left lower lobe pulmonary nodule on image number 66/3. This has increased in size since the prior study from May 2023 and worrisome for a metastatic focus. Upper Abdomen: No significant upper abdominal findings. Stable severe vascular disease. Stable left hepatic lobe cyst. Musculoskeletal: Status post left mastectomy. No axillary adenopathy. The thyroid gland is grossly normal. The bony thorax is intact. IMPRESSION: 1. Persistent and slightly larger right pleural effusion with overlying atelectasis. 2. Stable probable post treatment changes involving the right upper lobe with dense areas of masslike fibrotic changes. 3. Stable severe emphysematous changes and pulmonary scarring. 4. 8 mm superior segment left lower lobe pulmonary nodule enlarged since May of 2023 and worrisome for a metastatic focus. 5. Stable advanced atherosclerotic calcification involving the aorta and coronary arteries. 6. Aortic atherosclerosis. Aortic Atherosclerosis (ICD10-I70.0) and Emphysema (ICD10-J43.9). Electronically Signed   By: Marijo Sanes M.D.   On: 08/01/2022 15:46   DG Chest 2 View  Result Date: 08/01/2022 CLINICAL DATA:  Acute on chronic congestive heart failure. EXAM: CHEST - 2 VIEW COMPARISON:  July 30, 2022. FINDINGS: Mild cardiomegaly is noted. Mild to moderate right pleural effusion is noted with  associated atelectasis. Left lung is unremarkable. Bony thorax is unremarkable. IMPRESSION: Mild to moderate right pleural effusion is noted with associated atelectasis. Aortic Atherosclerosis (ICD10-I70.0). Electronically Signed   By: Marijo Conception M.D.   On: 08/01/2022 15:46   DG Chest Port 1 View  Result Date: 07/30/2022 CLINICAL DATA:  Shortness of breath. Pleural effusion. EXAM: PORTABLE CHEST 1 VIEW COMPARISON:  07/29/2022 FINDINGS: Stable enlarged cardiac silhouette, right pleural effusion, mildly prominent interstitial markings, right upper lung zone linear atelectasis or scarring and ill-defined opacity in the right lower lung zone. IMPRESSION: 1. Stable cardiomegaly and mild interstitial lung disease with possible superimposed mild interstitial pulmonary edema. 2. Stable right pleural effusion. 3. Stable right upper lung zone linear atelectasis or scarring and increased density in the right lower lung zone due to the pleural effusion with possible atelectasis or pneumonia. Electronically Signed   By: Claudie Revering M.D.   On: 07/30/2022 11:41   DG Chest Port 1 View  Result Date: 07/29/2022 CLINICAL DATA:  Dyspnea EXAM: PORTABLE CHEST 1 VIEW COMPARISON:  07/28/2022 FINDINGS: Unchanged appearance of right hilum and parahilar masslike opacity. Mild interstitial opacity without overt edema. Cardiomediastinal contours are normal. Suspect layering right basilar effusion. IMPRESSION: 1. Unchanged appearance of right hilum and parahilar masslike opacity. 2. Suspect layering right basilar effusion. Electronically Signed   By: Ulyses Jarred M.D.   On: 07/29/2022 21:07   DG Chest Port 1 View  Result Date: 07/28/2022 CLINICAL DATA:  Shortness of breath, COPD EXAM: PORTABLE CHEST 1 VIEW COMPARISON:  07/26/2022 FINDINGS: Unchanged AP portable examination. Cardiomegaly with a small right pleural effusion and perihilar masslike consolidation. Mild, diffuse bilateral interstitial pulmonary opacity. Osseous  structures unremarkable. IMPRESSION: 1. Unchanged AP portable examination. Cardiomegaly with a small right pleural effusion and perihilar masslike consolidation. 2. Mild, diffuse bilateral interstitial pulmonary opacity, likely edema. No new or focal airspace opacity. Electronically Signed   By: Delanna Ahmadi M.D.   On: 07/28/2022 12:22   DG Chest Port 1 View  Result Date: 07/26/2022 CLINICAL DATA:  Pleural effusion, shortness of breath EXAM: PORTABLE CHEST 1 VIEW COMPARISON:  07/23/2022 FINDINGS: Right pleural effusion is similar to prior study. Right lower lobe airspace opacities also stable. No confluent opacity on the left. Heart is normal size. Aortic atherosclerosis. No acute bony abnormality. IMPRESSION: Stable small right pleural effusion with right lower lobe airspace opacities. Electronically Signed   By: Rolm Baptise M.D.   On: 07/26/2022 14:26   DG Chest Port 1 View  Result Date: 07/23/2022 CLINICAL DATA:  Pleural effusion. EXAM: PORTABLE CHEST 1 VIEW COMPARISON:  Chest radiograph 07/19/2022 FINDINGS: Multiple monitoring leads overlie the patient. Stable cardiomegaly. Aortic atherosclerosis. There appears to be worsening layering right pleural effusion with increased underlying opacities within the right lung. Similar-appearing heterogeneous opacities right upper lung. Similar appearance of the left lung. No pneumothorax. Thoracic spine degenerative changes. IMPRESSION: Worsening layering right pleural effusion with underlying opacities which may represent atelectasis or infection. Electronically Signed   By: Lovey Newcomer M.D.   On: 07/23/2022 11:39   DG ESOPHAGUS W SINGLE CM (SOL OR THIN BA)  Result Date: 07/21/2022 CLINICAL DATA:  Patient admitted for worsening shortness of breath. Dysphagia, with solids and liquids sticking in chest. EXAM: ESOPHAGUS/BARIUM SWALLOW/TABLET STUDY TECHNIQUE: Single contrast examination was performed using thin liquid barium. This exam was performed by Narda Rutherford, NP, and was supervised and interpreted by Earle Gell, MD. FLUOROSCOPY: Radiation Exposure Index (as provided by the fluoroscopic device): 70.10 mGy  [place] COMPARISON:  CT AP dated 07/20/2022 FINDINGS: Limited examination due to patient's inability to stand and assume routine positions on fluoroscopy table. Exam was also limited by patient's inability to perform bolus swallowing severe nausea. Swallowing: No vestibular penetration or aspiration seen. Pharynx: Unremarkable. Esophagus: Patient was only able to drink a small amount of barium. This shows severe esophageal dysmotility and stasis of contrast in the esophagus. There was poor filling of the distal esophagus, and patient could not be placed in semi-erect or standing position. However, some barium does pass through the esophagus into the stomach, and no obstructing mass or stricture is identified. Hiatal Hernia: None. Ingested 8m barium tablet: Not given Other: None. IMPRESSION: Technically limited exam as discussed above. Severe esophageal dysmotility. No evidence of esophageal obstruction. Electronically Signed   By: JMarlaine HindM.D.   On: 07/21/2022 16:36   CT CHEST ABDOMEN PELVIS WO CONTRAST  Result Date: 07/20/2022 CLINICAL DATA:  Abdominal pain, RIGHT lower quadrant pain radiating to LEFT side, acute kidney injury, question sepsis. History CHF, lung cancer, breast cancer, atrial fibrillation, COPD, former smoker EXAM: CT CHEST, ABDOMEN AND PELVIS WITHOUT CONTRAST TECHNIQUE: Multidetector CT imaging of the chest, abdomen and pelvis was performed following the standard protocol without IV contrast. Patient drank dilute oral contrast for exam. RADIATION DOSE REDUCTION: This exam was performed according to the departmental dose-optimization program which includes automated exposure control, adjustment of the mA and/or kV according to patient size and/or use of iterative reconstruction technique. COMPARISON:  CT angio chest 06/16/2022, CT abdomen  and pelvis 05/08/2022 FINDINGS: CT CHEST FINDINGS Cardiovascular: Extensive atherosclerotic calcifications aorta, proximal great vessels, and coronary arteries. Aorta normal caliber. Mild enlargement of cardiac chambers. No pericardial effusion. Mediastinum/Nodes: Esophagus unremarkable. Base of cervical region normal appearance. Prior LEFT mastectomy. No adenopathy. Lungs/Pleura: Small RIGHT pleural effusion. Emphysematous changes with chronic opacity RIGHT upper lobe with spiculated margins, 2.6 x 1.8 cm unchanged. Additional masslike area of opacity in the anterior RIGHT upper lobe extending to the pleural surface measures 4.1 x 1.9 cm, unchanged. Mild compressive atelectasis of RIGHT lower lobe. Subpleural scarring anterolateral LEFT upper lobe suspect related to prior radiation therapy to mastectomy bed, unchanged. No infiltrate identified. Nodular density posteromedial LEFT lower lobe 9 x 6 mm previously 7 x 4 mm, cannot exclude metastasis. Minimal dependent atelectasis LEFT lower lobe. Musculoskeletal: Osseous demineralization without focal bone lesion. CT ABDOMEN PELVIS FINDINGS Hepatobiliary: Question cyst anterior LEFT lobe liver 11 mm unchanged. Additional 6 mm nonspecific low-attenuation focus RIGHT lobe liver image 68 unchanged. Gallbladder and liver otherwise normal appearance. Pancreas: Normal appearance Spleen: Normal appearance Adrenals/Urinary Tract: Adrenal glands, kidneys, ureters, and bladder normal appearance Stomach/Bowel: Stomach unremarkable. Diverticulosis of descending and sigmoid colon without evidence of diverticulitis. Remaining bowel loops unremarkable. Appendix not visualized. Vascular/Lymphatic: Extensive atherosclerotic calcifications without aneurysm. Significant atherosclerotic calcifications at the origins of the superior mesenteric and celiac arteries. No adenopathy. Reproductive: Atrophic uterus and adnexa with question small exophytic leiomyoma from anterior uterine wall 18 mm  diameter tubal ligation clips. Other: No free air or free fluid. No hernia or inflammatory process. Musculoskeletal: Osseous demineralization. Chronic mild superior endplate compression deformities of L2 and L4. IMPRESSION: Small RIGHT pleural effusion with compressive atelectasis of RIGHT lower lobe, slightly decreased. Stable masslike opacities in the RIGHT upper lobe question related to treated tumor. Slight increase in size of a LEFT lower lobe nodule 9 x 6 mm, cannot exclude metastasis. Distal colonic diverticulosis without evidence of diverticulitis. No acute intra-abdominal or intrapelvic abnormalities.  Extensive atherosclerotic calcifications including coronary arteries. Aortic Atherosclerosis (ICD10-I70.0) and Emphysema (ICD10-J43.9). Electronically Signed   By: Lavonia Dana M.D.   On: 07/20/2022 19:05   DG Abd 1 View  Result Date: 07/20/2022 CLINICAL DATA:  Right lower quadrant pain EXAM: ABDOMEN - 1 VIEW COMPARISON:  06/20/2022 FINDINGS: The bowel gas pattern is normal. No radio-opaque calculi or other significant radiographic abnormality are seen. Tubal ligation clips are noted. Aortoiliac atherosclerosis. IMPRESSION: Negative. Electronically Signed   By: Davina Poke D.O.   On: 07/20/2022 15:09   US THORACENTESIS ASP PLEURAL SPACE W/IMG GUIDE  Result Date: 07/19/2022 INDICATION: Patient with history of chronic kidney disease, atrial fibrillation, congestive heart failure and right sided lung cancer status post radiation with recurrent right-sided pleural effusion. EXAM: ULTRASOUND GUIDED RIGHT THORACENTESIS MEDICATIONS: Local 1% lidocaine only. COMPLICATIONS: None immediate. PROCEDURE: An ultrasound guided thoracentesis was thoroughly discussed with the patient and questions answered. The benefits, risks, alternatives and complications were also discussed. The patient understands and wishes to proceed with the procedure. Written consent was obtained. Ultrasound was performed to localize and  mark an adequate pocket of fluid in the right chest. The area was then prepped and draped in the normal sterile fashion. 1% Lidocaine was used for local anesthesia. Under ultrasound guidance a 19 gauge, 7-cm, Yueh catheter was introduced. Thoracentesis was performed. The catheter was removed and a dressing applied. FINDINGS: A total of approximately 900 mL of amber colored fluid was removed. IMPRESSION: Successful ultrasound guided right thoracentesis yielding 900 mL of pleural fluid. This exam was performed by Tsosie Billing PA-C, and was supervised and interpreted by Dr. Earleen Newport. Electronically Signed   By: Corrie Mckusick D.O.   On: 07/19/2022 11:43   DG Chest Port 1 View  Result Date: 07/19/2022 CLINICAL DATA:  Post thoracentesis. EXAM: PORTABLE CHEST 1 VIEW COMPARISON:  Radiographs 07/17/2022 and 06/19/2022.  CT 06/16/2022. FINDINGS: 1121 hours. The right pleural effusion appears decreased in volume. There is improved aeration of the right lung base. No evidence of pneumothorax. The heart size and mediastinal contours are stable. The right suprahilar lesion appears grossly stable. No edema or new airspace disease identified. The bones appear unchanged. Telemetry leads overlie the chest. IMPRESSION: Decreased right pleural effusion and improved aeration of the right lung base following thoracentesis. No evidence of pneumothorax. Electronically Signed   By: Richardean Sale M.D.   On: 07/19/2022 11:39   DG Chest Port 1 View  Result Date: 07/26/2022 CLINICAL DATA:  Short of breath, left breast cancer EXAM: PORTABLE CHEST 1 VIEW COMPARISON:  06/19/2022, 06/16/2022 FINDINGS: Single frontal view of the chest demonstrates stable enlargement of the cardiac silhouette. There is a small right pleural effusion, increased since prior study. Increased consolidation at the right lung base may reflect atelectasis or airspace disease. Stable linear consolidation right suprahilar region. No pneumothorax. No acute bony  abnormality. IMPRESSION: 1. Small right pleural effusion, increased since prior study. 2. Increasing right basilar consolidation, which may reflect airspace disease or progressive atelectasis. 3. Masslike linear consolidation right suprahilar region, unchanged since recent CT. Electronically Signed   By: Randa Ngo M.D.   On: 07/17/2022 18:32    Labs:  CBC: Recent Labs    07/28/22 1410 07/31/22 0425 08/03/22 0353 08/05/22 0326  WBC 6.6 5.5 5.6 6.9  HGB 10.4* 9.4* 9.6* 9.8*  HCT 32.8* 29.5* 29.7* 30.1*  PLT 230 223 241 239    COAGS: Recent Labs    11/26/21 1730 01/27/22 0429 02/22/22 2230 03/09/22 2335 03/11/22  3614 05/08/22 0837  INR 1.6* 1.3* 1.3* 1.2 1.2 1.2  APTT 40* 40*  --   --   --  35    BMP: Recent Labs    08/03/22 0353 08/04/22 0523 08/05/22 0326 08/06/22 0431  NA 138 137 136 135  K 4.0 3.7 4.7 5.1  CL 100 99 97* 97*  CO2 _0 GLUCOSE 102* 102* 96 106*  BUN 73* 79* 83* 92*  CALCIUM 9.1 9.0 8.9 8.9  CREATININE 2.88* 3.39* 3.38* 4.21*  GFRNONAA 17* 14* 14* 11*    LIVER FUNCTION TESTS: Recent Labs    05/23/22 0554 06/16/22 2024 06/17/22 1044 08/13/2022 2205 07/26/22 0409  BILITOT 1.0 0.7 1.1 0.8  --   AST _1 --   ALT _2 --   ALKPHOS 56 58 52 55  --   PROT 6.4* 7.4 7.4 6.5  --   ALBUMIN 3.3* 3.7 3.4* 3.5 3.1*    TUMOR MARKERS: No results for input(s): "AFPTM", "CEA", "CA199", "CHROMGRNA" in the last 8760 hours.  Assessment and Plan:  History of breast, esophageal and lung cancer; recurrent right pleural effusion: Danielle [name], 66 year old female, is tentatively scheduled 08/07/22 for an image-guided tunneled pleural catheter.   Risks and benefits discussed with the patient including bleeding, infection, damage to adjacent structures, malfunction of the catheter with need for additional procedures.  All of the patient's questions were answered, patient is agreeable to proceed. She will be NPO at midnight.    Consent signed and in chart.  Thank you for this interesting consult.  I greatly enjoyed meeting Danielle [name] and look forward to participating in their care.  A copy of this report was sent to the requesting provider on this date.  Electronically Signed: Soyla Dryer, AGACNP-BC [phone] 08/06/2022, 2:02 PM   I spent a total of 20 Minutes    in face to face in clinical consultation, greater than 50% of which was counseling/coordinating care for tunneled pleural catheter.

## 2022-08-06 NOTE — Progress Notes (Signed)
Progress Note   Patient: Danielle [name] EQA:[id number] DOB: Feb 27, 1956 DOA: 08/03/2022     19 DOS: the patient was seen and examined on 08/06/2022   Brief hospital course: DELETHA JAFFEE is a 66 y.o. female with medical history significant for Paroxysmal A-fib on chronic anticoagulation therapy, history of chronic systolic heart failure, history of adenocarcinoma of the right lung status post radiation therapy, history of stage IIIb chronic kidney disease, aortic valve stenosis, COPD with chronic respiratory failure on 4 L of oxygen continuous, history of left breast cancer status post mastectomy, history of esophageal cancer recurrent right-sided pleural effusion requiring thoracentesis, most recently on 06/19/2022 who presented to the ER on 07/17/2022 for evaluation of worsening shortness of breath with associated chest tightness.    ED course and evaluation --- BP 135/99 with RR 29 with O2 sat 90% on increased O2 flow rate of 5 L/min.  Labs with troponin 15>>17, BNP 2935.  Treated with IV Lasix in the ED.  Chest x-ray showed -- Small right pleural effusion, increased since prior study.  Increasing right basilar consolidation, which may reflect airspace disease or progressive atelectasis.  Mass-like linear consolidation right suprahilar region, unchanged since recent CT.  Patient was admitted to hospitalist service and IV diuresis was continued.    IR was consulted for thoracentesis which was done on 10/5 with 900 cc fluid removed.   Hospital course complicated by: --Acute kidney injury after diuresis due to cardiorenal physiology.  Nephrology was consulted.   Renal function improved with diuretics held, but pt with persistent and worsening exertional dyspnea again. --Abdominal pain for which evaluation including abdominal xray and CT abdomen/pelvis was unremarkable. --Swallowing difficulties with severe esophageal dysmotility discovered on barium esophagram.    10/19.  Patient developed  worsening hypoxemia, currently on high flow oxygen.  We will place a Pleurx catheter as patient pleural effusion has reaccumulated, planned on Monday. Also restarted Lasix drip with milrinone after cardiology consult. 10/20.  Lasix discontinued as patient renal function is getting worse.     Assessment and Plan: Acute on chronic combined systolic and diastolic CHF (congestive heart failure) (HCC) Acute on chronic respiratory failure with hypoxia (HCC) Moderate aortic stenosis, probably severe aortic stenosis due to reduced ejection fraction. Moderate to severe tricuspid regurgitation. Moderate pulm hypertension 2021, probably severe now. Echocardiogram performed on 01/2022 showed ejection from 35 to 40%, also showed moderate aortic stenosis, moderate to severe tricuspid regurgitation.  The echocardiogram performed 2021, patient also had moderate pulm hypertension, she probably had severe pulm hypertension now. Patient is transferred to stepdown unit on 10/19, started on milrinone and IV Lasix drip. However, patient did not diuresing well with Lasix drip, still significant short of breath and hypoxemia, renal functions are worse, she developed more tachycardia with atrial fibrillation.   Patient seem to have gradually worsening hypoxemia, currently 100% heated high flow.  Patient still on milrinone infusion. Discussed with nephrology, patient will have dialysis catheter placed, started on ultrafiltration. Patient also scheduled to have a Pleurx catheter placed tomorrow.   Acute kidney injury on chronic kidney disease stage IIIb. Cardiorenal syndrome. Dialysis scheduled.   Transient hypotension No recurrence.    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   Recurrent right pleural effusion. Last thoracentesis  was 10/5 with 900 mm fluid removed.  Reviewed her previous cytology results from thoracentesis,  no malignancy identified. Pleurx catheter is scheduled on Monday.   Dysphagia secondary to esophageal dysmotility  Followed by speech therapy.   Prolonged QT interval. Avoid medication that can further prolong QT interval, monitor electrolytes.   Paroxysmal atrial fibrillation with rapid ventricular response. Continue current dose of beta-blocker.   Severe COPD  CT chest showed severe emphysema with large bullae.         Subjective:  Patient with few breathing is harder today.  She is constipated.  No abdominal pain or nausea vomiting.  Physical Exam: Vitals:   08/06/22 0800 08/06/22 0900 08/06/22 1000 08/06/22 1100  BP: 99/83 (!) 86/77 103/80 92/67  Pulse: 98 (!) 117 (!) 119 (!) 120  Resp: 15 (!) 21 (!) 24 19  Temp: 98.2 F (36.8 C)     TempSrc:      SpO2: 97% 96% 91% 91%  Weight:      Height:       General exam: Appears calm and comfortable  Respiratory system: Crackles in the bases.  Respiratory effort normal. Cardiovascular system: Irregularly irregular with tachycardia.  no JVD, murmurs, rubs, gallops or clicks. No pedal edema. Gastrointestinal system: Abdomen is nondistended, soft and nontender. No organomegaly or masses felt. Normal bowel sounds heard. Central nervous system: Alert and oriented. No focal neurological deficits. Extremities: Symmetric 5 x 5 power. Skin: No rashes, lesions or ulcers Psychiatry: Judgement and insight appear normal. Mood & affect appropriate.   Data Reviewed:  Lab results reviewed.  Family Communication:   Disposition: Status is: Inpatient Remains inpatient appropriate because: Severity of disease, IV infusion, pending inpatient procedures.  Planned Discharge Destination:  TBD    Time spent: 50 minutes  Author: Sharen Hones, MD 08/06/2022 1:48 PM  For on call review [email or web address].

## 2022-08-07 ENCOUNTER — Inpatient Hospital Stay: Payer: Medicare Other

## 2022-08-07 DIAGNOSIS — J90 Pleural effusion, not elsewhere classified: Secondary | ICD-10-CM | POA: Diagnosis not present

## 2022-08-07 DIAGNOSIS — Z7189 Other specified counseling: Secondary | ICD-10-CM | POA: Diagnosis not present

## 2022-08-07 DIAGNOSIS — J9621 Acute and chronic respiratory failure with hypoxia: Secondary | ICD-10-CM | POA: Diagnosis not present

## 2022-08-07 DIAGNOSIS — N184 Chronic kidney disease, stage 4 (severe): Secondary | ICD-10-CM | POA: Diagnosis not present

## 2022-08-07 DIAGNOSIS — N179 Acute kidney failure, unspecified: Secondary | ICD-10-CM | POA: Diagnosis not present

## 2022-08-07 DIAGNOSIS — I480 Paroxysmal atrial fibrillation: Secondary | ICD-10-CM | POA: Diagnosis not present

## 2022-08-07 DIAGNOSIS — I5043 Acute on chronic combined systolic (congestive) and diastolic (congestive) heart failure: Secondary | ICD-10-CM | POA: Diagnosis not present

## 2022-08-07 LAB — BASIC METABOLIC PANEL
Anion gap: 12 (ref 5–15)
BUN: 89 mg/dL — ABNORMAL HIGH (ref 8–23)
CO2: 23 mmol/L (ref 22–32)
Calcium: 8.9 mg/dL (ref 8.9–10.3)
Chloride: 100 mmol/L (ref 98–111)
Creatinine, Ser: 4.45 mg/dL — ABNORMAL HIGH (ref 0.44–1.00)
GFR, Estimated: 10 mL/min — ABNORMAL LOW (ref >60)
Glucose, Bld: 103 mg/dL — ABNORMAL HIGH (ref 70–99)
Potassium: 3.8 mmol/L (ref 3.5–5.1)
Sodium: 135 mmol/L (ref 135–145)

## 2022-08-07 LAB — ALBUMIN, PLEURAL OR PERITONEAL FLUID: Albumin, Fluid: 2.1 g/dL

## 2022-08-07 LAB — GLUCOSE, PLEURAL OR PERITONEAL FLUID: Glucose, Fluid: 115 mg/dL

## 2022-08-07 LAB — LACTATE DEHYDROGENASE, PLEURAL OR PERITONEAL FLUID: LD, Fluid: 73 U/L — ABNORMAL HIGH (ref 3–23)

## 2022-08-07 LAB — HEPATITIS B SURFACE ANTIBODY, QUANTITATIVE: Hep B S AB Quant (Post): <3.1 m[IU]/mL — ABNORMAL LOW (ref >9.9)

## 2022-08-07 MED ORDER — LIDOCAINE HCL (PF) 1 % IJ SOLN
2.0000 mL | Freq: Once | INTRAMUSCULAR | Status: DC
  Filled 2022-08-07: qty 2

## 2022-08-07 MED ORDER — LIDOCAINE HCL (PF) 1 % IJ SOLN: 0.0000 mL | Freq: Once | INTRAMUSCULAR | Status: DC | PRN

## 2022-08-07 MED ORDER — FENTANYL CITRATE PF 50 MCG/ML IJ SOSY
50.0000 ug | PREFILLED_SYRINGE | Freq: Once | INTRAMUSCULAR | Status: AC
  Administered 2022-08-08: 50 ug via INTRAVENOUS
  Filled 2022-08-07: qty 1

## 2022-08-07 MED ORDER — ALBUMIN HUMAN 25 % IV SOLN
25.0000 g | Freq: Once | INTRAVENOUS | Status: AC
  Administered 2022-08-07: 25 g via INTRAVENOUS
  Filled 2022-08-07: qty 100

## 2022-08-07 MED ORDER — METOPROLOL TARTRATE 25 MG PO TABS
25.0000 mg | ORAL_TABLET | Freq: Once | ORAL | Status: AC
  Administered 2022-08-07: 25 mg via ORAL
  Filled 2022-08-07: qty 1

## 2022-08-07 MED ORDER — APIXABAN 2.5 MG PO TABS
2.5000 mg | ORAL_TABLET | Freq: Two times a day (BID) | ORAL | Status: DC
  Administered 2022-08-07 – 2022-08-24 (×34): 2.5 mg via ORAL
  Filled 2022-08-07 (×34): qty 1

## 2022-08-07 MED ORDER — METOPROLOL TARTRATE 25 MG PO TABS: 25.0000 mg | ORAL_TABLET | Freq: Two times a day (BID) | ORAL | Status: DC

## 2022-08-07 MED ORDER — SODIUM CHLORIDE 0.9% FLUSH
10.0000 mL | Freq: Three times a day (TID) | INTRAVENOUS | Status: DC
  Administered 2022-08-07 – 2022-08-11 (×13): 10 mL via INTRAPLEURAL

## 2022-08-07 MED ORDER — LIDOCAINE HCL 1 % IJ SOLN
INTRAMUSCULAR | Status: AC
  Administered 2022-08-07: 10 mL
  Filled 2022-08-07: qty 10

## 2022-08-07 NOTE — Progress Notes (Addendum)
0810: Pt oxygen saturations 83% on maximum settings on High Flow Nasal Cannula. Roosevelt Locks, MD notified; critical care consulted by MD.   [phone]: Chest tube placed by Dgayli, MD; 1400 mL output in chest tube. Chest tube to be clamped at 1549mL per Dr. Genia Harold.   0945: Per conversation with daughter, family wants to do what the pt wants. Palliative medicine reaching out to continue previous goals of care discussions.   1400: This RN concerned with giving Metoprolol with the continuous Milrinone drip; current blood pressure 89/64. Reported to Dr. Roosevelt Locks; per  MD, give metoprolol for elevated heart rate, currently 113. Metoprolol order changed to twice per day.  1430: Pt refusing a bath due to pain and discomfort. PRN Norco and Morphine given for pain control.

## 2022-08-07 NOTE — Progress Notes (Addendum)
Progress Note   Patient: Danielle [name] SAY:[id number] DOB: March 27, 1956 DOA: 08/09/2022     20 DOS: the patient was seen and examined on 08/07/2022   Brief hospital course: Danielle [name] is a 66 y.o. female with medical history significant for Paroxysmal A-fib on chronic anticoagulation therapy, history of chronic systolic heart failure, history of adenocarcinoma of the right lung status post radiation therapy, history of stage IIIb chronic kidney disease, aortic valve stenosis, COPD with chronic respiratory failure on 4 L of oxygen continuous, history of left breast cancer status post mastectomy, history of esophageal cancer recurrent right-sided pleural effusion requiring thoracentesis, most recently on 06/19/2022 who presented to the ER on 07/16/2022 for evaluation of worsening shortness of breath with associated chest tightness.    ED course and evaluation --- BP 135/99 with RR 29 with O2 sat 90% on increased O2 flow rate of 5 L/min.  Labs with troponin 15>>17, BNP 2935.  Treated with IV Lasix in the ED.  Chest x-ray showed -- Small right pleural effusion, increased since prior study.  Increasing right basilar consolidation, which may reflect airspace disease or progressive atelectasis.  Mass-like linear consolidation right suprahilar region, unchanged since recent CT.  Patient was admitted to hospitalist service and IV diuresis was continued.    IR was consulted for thoracentesis which was done on 10/5 with 900 cc fluid removed.   Hospital course complicated by: --Acute kidney injury after diuresis due to cardiorenal physiology.  Nephrology was consulted.   Renal function improved with diuretics held, but pt with persistent and worsening exertional dyspnea again. --Abdominal pain for which evaluation including abdominal xray and CT abdomen/pelvis was unremarkable. --Swallowing difficulties with severe esophageal dysmotility discovered on barium esophagram.    10/19.  Patient developed  worsening hypoxemia, currently on high flow oxygen.  We will place a Pleurx catheter as patient pleural effusion has reaccumulated, planned on Monday. Also restarted Lasix drip with milrinone after cardiology consult. 10/20.  Lasix discontinued as patient renal function is getting worse. 10/24.  Paracentesis performed, hemodialysis was started.   HD is planned, not started yet.   Assessment and Plan: Acute on chronic combined systolic and diastolic CHF (congestive heart failure) (HCC) Acute on chronic respiratory failure with hypoxia (HCC) Moderate aortic stenosis, probably severe aortic stenosis due to reduced ejection fraction. Moderate to severe tricuspid regurgitation. Moderate pulm hypertension 2021, probably severe now. Echocardiogram performed on 01/2022 showed ejection from 35 to 40%, also showed moderate aortic stenosis, moderate to severe tricuspid regurgitation.  The echocardiogram performed 2021, patient also had moderate pulm hypertension, she probably had severe pulm hypertension now. Patient is transferred to stepdown unit on 10/19, started on milrinone and IV Lasix drip. However, patient did not diuresing well with Lasix drip, still significant short of breath and hypoxemia, renal functions are worse, she developed more tachycardia with atrial fibrillation.   Patient has gradually worsening hypoxemia, to a point she is receiving 100% heated high flow.  Initially planned for Pleurx catheter, but thoracentesis was performed today due to worsening shortness of breath.  Removed 1.4 liters of fluids. Patient is also on milrinone, followed by cardiology. Patient is planned for dialysis per nephrology with ultrafiltration. Patient condition is terminal, patient has been resistant to any palliative measure.  But after discussion, she is agreeable for DO NOT RESUSCITATE status. We will need to continue goals of care discussion, potential discharge comfort care if she agrees.   Acute  kidney injury on chronic kidney disease stage IIIb. Cardiorenal  syndrome. Dialysis started per nephrology.   Transient hypotension No recurrence.    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   Recurrent right pleural effusion. Last thoracentesis was 10/5 with 900 mm fluid removed.  Reviewed her previous cytology results from thoracentesis, no malignancy identified. Pleurx catheter is scheduled on Monday.   Dysphagia secondary to esophageal dysmotility  Followed by speech therapy.   Prolonged QT interval. Avoid medication that can further prolong QT interval, monitor electrolytes.   Paroxysmal atrial fibrillation with rapid ventricular response. Continue beta-blocker for now.  Followed by cardiology.  Since we have no additional procedure scheduled, I will start Eliquis again.   Severe COPD  CT chest showed severe emphysema with large bullae.    Addendum: 7517. Patient blood pressure is low, will give albumin 25 gram, may repeat. DC metoprolol.  D/W ICU.      Subjective:  Patient had worsening shortness of breath and hypoxemia this morning.  No cough.  Physical Exam: Vitals:   08/07/22 1330 08/07/22 1345 08/07/22 1355 08/07/22 1400  BP: 90/66 (!) 97/46  (!) 89/64  Pulse:  (!) 121  (!) 120  Resp: 18 16  17   Temp:      TempSrc:      SpO2:  96% (!) 89% (!) 83%  Weight:      Height:       General exam: Appears calm and comfortable  Respiratory system: Decreased breath sound on right, crackles in the left.[name] Respiratory effort normal. Cardiovascular system: Irregularly irregular, tachycardic. No JVD, murmurs, rubs, gallops or clicks. No pedal edema. Gastrointestinal system: Abdomen is nondistended, soft and nontender. No organomegaly or masses felt. Normal bowel sounds heard. Central nervous  system: Alert and oriented. No focal neurological deficits. Extremities: Symmetric 5 x 5 power. Skin: No rashes, lesions or ulcers Psychiatry: Mood & affect appropriate.   Data Reviewed:  Lab results reviewed.  Family Communication: Daughter updated.  Disposition: Status is: Inpatient Remains inpatient appropriate because: Severity of disease, IV treatment.  Planned Discharge Destination:  TBD    Time spent: 50 minutes  Author: Sharen Hones, MD 08/07/2022 2:40 PM  For on call review [email or web address].

## 2022-08-07 NOTE — Procedures (Signed)
Insertion of Chest Tube Procedure Note  Danielle [name]  [id number]  August 21, 1956  Date:08/07/22  Time:9:20 AM   I was asked to see the patient by the attending provider of record, Dr. Roosevelt Locks. Patient was in respiratory extremis, saturating at 82% with tachypnea. Patient known to have a right sided pleural effusion due to sequale of congestive heart failure. US of the right pleural space showed a large pleural effusion. Decision made at the bedside for drainage. Discussed with patient and consented for tube thoracostomy.  Provider Performing: Armando Reichert   Procedure: Pleural Catheter Insertion w/ Imaging Guidance (38333)    Indication(s) Effusion  Consent Risks of the procedure as well as the alternatives and risks of each were explained to the patient and/or caregiver.  Consent for the procedure was obtained and is signed in the bedside chart  Anesthesia Topical only with 1% lidocaine   Time Out Verified patient identification, verified procedure, site/side was marked, verified correct patient position, special equipment/implants available, medications/allergies/relevant history reviewed, required imaging and test results available.  Sterile Technique Maximal sterile technique including full sterile barrier drape, hand hygiene, sterile gown, sterile gloves, mask, hair covering, sterile ultrasound probe cover (if used).   Procedure Description Ultrasound used to identify appropriate pleural anatomy for placement and overlying skin marked. Area of placement cleaned and draped in sterile fashion.  A 14 French pigtail pleural catheter was placed into the right pleural space using Seldinger technique. Appropriate return of fluid was obtained. The tube was connected to atrium and placed on -20 cm H2O wall suction to facilitate fluid removal. At the end of the procedure the patient was placed to water seal.   Complications/Tolerance None; patient tolerated the procedure well. Chest  X-ray is ordered to verify placement.   EBL Minimal  Specimen(s) fluid  Armando Reichert, MD [facility]

## 2022-08-07 NOTE — Progress Notes (Signed)
NAME:  ADYLIN HANKEY, MRN:  [id number], DOB:  1956/02/15, LOS: 76 ADMISSION DATE:  08/01/2022, CONSULTATION DATE:  08/07/2022 REFERRING MD:  Sharen Hones, MD, CHIEF COMPLAINT:  Shortness of breath, Hypoxia   History of Present Illness:  Ms. Foos is a pleasant 66 year old female presenting to the hospital with worsening shortness of breath and respiratory failure prompting admission on 08/14/2022. She has a history of a right sided pleural effusion s/p recurrent thoracentesis (underwent thoracentesis shortly after admission on 10/5), heart failure with reduced ejection fraction (currently on milrinone), CKD, lung cancer, and esophageal cancer s/p radiation all of which are contributing to her respiratory compromise and recurrence of her pleural effusion. During this hospitalization, she has been managed for decompensated heart failure and pleural effusion with diuresis, with minimal response. She was seen by pulmonary (Dr. Lanney Gins) who, given her failure to improve given aggressive medical therapy, recommended palliative care involvement. She has been seen by pulmonology, cardiology, and nephrology who have followed alongside internal medicine. Palliative care has followed along and have been assisting with conversations regarding goals of care. Patient was to get a tunneled pleural catheter for her recurrent pleural effusion today, but decompensated and developed worsening hypoxic respiratory failure prompting involvement of the critical care team given respiratory extremis.  Pertinent  Medical History  -Afib on AC -CHF -History of right lung adenocarcinoma -CKD stage III -AS -history of esophageal cancer s/p radiation -recurrent right sided pleural effusion -COPD   Objective   Blood pressure (!) 88/57, pulse (!) 123, temperature (!) 97.5 F (36.4 C), temperature source Oral, resp. rate (!) 24, height 5\' 6"  (1.676 m), weight 49.5 kg, SpO2 91 %.    FiO2 (%):  [60 %-98 %] 60 %    Intake/Output Summary (Last 24 hours) at 08/07/2022 1311 Last data filed at 08/07/2022 0500 Gross per 24 hour  Intake 158.99 ml  Output --  Net 158.99 ml   Filed Weights   08/04/22 0545 08/05/22 0500 08/07/22 0452  Weight: 50.1 kg 49.5 kg 49.5 kg    Examination: Physical Exam Constitutional:      General: She is in acute distress.     Appearance: She is ill-appearing.  HENT:     Head: Normocephalic.  Eyes:     Extraocular Movements: Extraocular movements intact.  Cardiovascular:     Rate and Rhythm: Tachycardia present. Rhythm irregular.     Heart sounds: Normal heart sounds.  Pulmonary:     Breath sounds: Rales present.     Comments: Decreased breath sounds over the right base Musculoskeletal:     Cervical back: Normal range of motion.  Neurological:     General: No focal deficit present.     Mental Status: She is alert and oriented to person, place, and time.      Assessment & Plan:   66 year old female with acute on chronic hypoxic respiratory failure with worsening oxygenation in the setting of worsening pleural effusion as well as end stage heart failure, COPD, and CKD.  #Acute Hypoxic Respiratory Failure #COPD #Recurrent Pleural Effusion #HFrEF #CKD #Aortic Stenosis  Patient with a history of lung and esophageal CA (s/p radiation), HFrEF, moderate aortic stenosis, CKD, and COPD with a recurrent right sided pleural effusion (multiple thoracentesis showing an exudate with negative cytology) who has developed worsening hypoxic respiratory failure requiring maximal HFNC support. She was in extremis this morning with profound hypoxia prompting consult to PCCM and the placement of a 72F pigtail in the right  pleural space. 1400 mL of fluid drained and sent for analysis with improvement in patient's symptoms and oxygenation. Patient's respiratory failure is multi-factorial due to HFrEF, AS, TR, CKD, COPD, and the pleural effusion. Palliative care has been involved and  goals of care actively discussed, she is currently DNR/DNI and hospice is being considered. Renal is consulted for consideration of dialysis and cardiology is following given advanced heart failure management with milrinone. Our team will continue to follow along and help guide chest tube management.  -chest tube to water seal -clamp chest tube if output > 1500 mL in 24 hours -flush chest tube tid -rest of management per primary team  Labs   CBC: Recent Labs  Lab 08/03/22 0353 08/05/22 0326  WBC 5.6 6.9  HGB 9.6* 9.8*  HCT 29.7* 30.1*  MCV 95.2 96.2  PLT 241 993    Basic Metabolic Panel: Recent Labs  Lab 08/02/22 0356 08/03/22 0353 08/04/22 0523 08/05/22 0326 08/06/22 0431 08/07/22 0601  NA 139 138 137 136 135 135  K 3.6 4.0 3.7 4.7 5.1 3.8  CL 103 100 99 97* 97* 100  CO2 27 26 27 28 27 23   GLUCOSE 96 102* 102* 96 106* 103*  BUN 76* 73* 79* 83* 92* 89*  CREATININE 2.77* 2.88* 3.39* 3.38* 4.21* 4.45*  CALCIUM 8.9 9.1 9.0 8.9 8.9 8.9  MG 2.0 2.0  --  2.2  --   --    GFR: Estimated Creatinine Clearance: 9.5 mL/min (A) (by C-G formula based on SCr of 4.45 mg/dL (H)). Recent Labs  Lab 08/03/22 0353 08/05/22 0326  WBC 5.6 6.9    Liver Function Tests: No results for input(s): "AST", "ALT", "ALKPHOS", "BILITOT", "PROT", "ALBUMIN" in the last 168 hours. No results for input(s): "LIPASE", "AMYLASE" in the last 168 hours. No results for input(s): "AMMONIA" in the last 168 hours.  ABG    Component Value Date/Time   PHART 7.38 08/02/2022 0030   PCO2ART 49 (H) 08/02/2022 0030   PO2ART 57 (L) 08/02/2022 0030   HCO3 29.0 (H) 08/02/2022 0030   ACIDBASEDEF 0.6 07/27/2022 0316   O2SAT 86.1 08/02/2022 0030     Coagulation Profile: No results for input(s): "INR", "PROTIME" in the last 168 hours.  Cardiac Enzymes: No results for input(s): "CKTOTAL", "CKMB", "CKMBINDEX", "TROPONINI" in the last 168 hours.  HbA1C: Hgb A1c MFr Bld  Date/Time Value Ref Range Status   06/17/2022 10:44 AM 4.4 (L) 4.8 - 5.6 % Final    Comment:    (NOTE) Pre diabetes:          5.7%-6.4%  Diabetes:              >6.4%  Glycemic control for   <7.0% adults with diabetes   11/01/2021 04:38 AM 4.9 4.8 - 5.6 % Final    Comment:    (NOTE) Pre diabetes:          5.7%-6.4%  Diabetes:              >6.4%  Glycemic control for   <7.0% adults with diabetes     CBG: Recent Labs  Lab 08/02/22 1525 08/05/22 1944  GLUCAP 120* 122*    Past Medical History:  She,  has a past medical history of Acute respiratory failure due to COVID-19 ([place]) (09/27/2019), Anemia, Anxiety, Aortic atherosclerosis (HCC), Aortic valve stenosis (02/10/2018), Arthritis, Atrial fibrillation ([place]), Breast cancer, left ([place]) (2000), Cancer of right lung ([place]) (07/30/2016), CHF (congestive heart failure) ([place]), Chronic diastolic CHF (congestive heart  failure) ([place]) (11/26/2021), CKD (chronic kidney disease), stage IV (HCC), COPD (chronic obstructive pulmonary disease) ([place]), Dependence on supplemental oxygen, Depression, Diastolic dysfunction (47/06/6282), DOE (dyspnea on exertion), GERD (gastroesophageal reflux disease), Heart murmur, History of 2019 novel coronavirus disease (COVID-19) (10/14/2019), HLD (hyperlipidemia), Hypertension, Long term current use of anticoagulant, Lymphedema, Personal history of chemotherapy, Personal history of radiation therapy, Respiratory tract infection due to COVID-19 virus (09/27/2019), SBO (small bowel obstruction) ([place]) (11/05/2020), and Vitamin D deficiency.   Surgical History:   Past Surgical History:  Procedure Laterality Date   Breast Biospy Left    [facility]   BREAST SURGERY     CARDIOVERSION N/A 02/23/2022   Procedure: CARDIOVERSION;  Surgeon: Corey Skains, MD;  Location: [facility] ORS;  Service: Cardiovascular;  Laterality: N/A;   COLONOSCOPY N/A 04/30/2018   Procedure: COLONOSCOPY;  Surgeon: Virgel Manifold, MD;  Location: [facility] ENDOSCOPY;  Service:  Endoscopy;  Laterality: N/A;   COLONOSCOPY N/A 07/22/2018   Procedure: COLONOSCOPY;  Surgeon: Virgel Manifold, MD;  Location: [facility] ENDOSCOPY;  Service: Endoscopy;  Laterality: N/A;   COLONOSCOPY WITH PROPOFOL N/A 09/21/2021   Procedure: COLONOSCOPY WITH PROPOFOL;  Surgeon: Benjamine Sprague, DO;  Location: [facility] ENDOSCOPY;  Service: General;  Laterality: N/A;   COLONOSCOPY WITH PROPOFOL N/A 03/11/2022   Procedure: COLONOSCOPY WITH PROPOFOL;  Surgeon: Annamaria Helling, DO;  Location: [facility] ENDOSCOPY;  Service: Gastroenterology;  Laterality: N/A;   DILATION AND CURETTAGE OF UTERUS     ELECTROMAGNETIC NAVIGATION BROCHOSCOPY Right 04/11/2016   Procedure: ELECTROMAGNETIC NAVIGATION BRONCHOSCOPY;  Surgeon: Vilinda Boehringer, MD;  Location: [facility] ORS;  Service: Cardiopulmonary;  Laterality: Right;   ESOPHAGOGASTRODUODENOSCOPY N/A 07/22/2018   Procedure: ESOPHAGOGASTRODUODENOSCOPY (EGD);  Surgeon: Virgel Manifold, MD;  Location: [facility] ENDOSCOPY;  Service: Endoscopy;  Laterality: N/A;   ESOPHAGOGASTRODUODENOSCOPY (EGD) WITH PROPOFOL N/A 05/07/2018   Procedure: ESOPHAGOGASTRODUODENOSCOPY (EGD) WITH PROPOFOL;  Surgeon: Lucilla Lame, MD;  Location: [facility] ENDOSCOPY;  Service: Endoscopy;  Laterality: N/A;   ESOPHAGOGASTRODUODENOSCOPY (EGD) WITH PROPOFOL N/A 04/24/2019   Procedure: ESOPHAGOGASTRODUODENOSCOPY (EGD) WITH PROPOFOL;  Surgeon: Jonathon Bellows, MD;  Location: [facility] ENDOSCOPY;  Service: Gastroenterology;  Laterality: N/A;   ESOPHAGOGASTRODUODENOSCOPY (EGD) WITH PROPOFOL N/A 01/12/2020   Procedure: ESOPHAGOGASTRODUODENOSCOPY (EGD) WITH PROPOFOL;  Surgeon: Jonathon Bellows, MD;  Location: [facility] ENDOSCOPY;  Service: Gastroenterology;  Laterality: N/A;   ESOPHAGOGASTRODUODENOSCOPY (EGD) WITH PROPOFOL N/A 04/28/2020   Procedure: ESOPHAGOGASTRODUODENOSCOPY (EGD) WITH PROPOFOL;  Surgeon: Jonathon Bellows, MD;  Location: [facility] ENDOSCOPY;  Service: Gastroenterology;  Laterality: N/A;   ESOPHAGOGASTRODUODENOSCOPY (EGD) WITH  PROPOFOL N/A 03/11/2022   Procedure: ESOPHAGOGASTRODUODENOSCOPY (EGD) WITH PROPOFOL;  Surgeon: Annamaria Helling, DO;  Location: [facility] ENDOSCOPY;  Service: Gastroenterology;  Laterality: N/A;   EUS N/A 05/07/2019   Procedure: FULL UPPER ENDOSCOPIC ULTRASOUND (EUS) RADIAL;  Surgeon: Jola Schmidt, MD;  Location: [facility] ENDOSCOPY;  Service: Endoscopy;  Laterality: N/A;   ILEOSCOPY N/A 07/22/2018   Procedure: ILEOSCOPY THROUGH STOMA;  Surgeon: Virgel Manifold, MD;  Location: [facility] ENDOSCOPY;  Service: Endoscopy;  Laterality: N/A;   ILEOSTOMY     ILEOSTOMY N/A 09/08/2018   Procedure: ILEOSTOMY REVISION POSSIBLE CREATION;  Surgeon: Herbert Pun, MD;  Location: [facility] ORS;  Service: General;  Laterality: N/A;   ILEOSTOMY CLOSURE N/A 08/15/2018   Procedure: DILATION OF ILEOSTOMY STRICTURE;  Surgeon: Herbert Pun, MD;  Location: [facility] ORS;  Service: General;  Laterality: N/A;   LAPAROTOMY Right 05/04/2018   Procedure: EXPLORATORY LAPAROTOMY right colectomy right and left ostomy;  Surgeon: Herbert Pun, MD;  Location: [facility] ORS;  Service: General;  Laterality: Right;   LEFT ATRIAL APPENDAGE OCCLUSION N/A 01/11/2021   Procedure: LEFT ATRIAL APPENDAGE OCCLUSION ([place]); ABORTED PROCEDURE WITHOUT DEVICE BEING IMPLANTED; Location: [facility]; Surgeon: Mylinda Latina, MD   LUNG BIOPSY     MASTECTOMY Left    2000, [place]   ROTATOR CUFF REPAIR Right    [place]   XI ROBOTIC ASSISTED COLOSTOMY TAKEDOWN N/A 10/23/2021   Procedure: XI ROBOTIC ASSISTED ILEOSTOMY TAKEDOWN;  Surgeon: Herbert Pun, MD;  Location: [facility] ORS;  Service: General;  Laterality: N/A;  180 minutes for the surgery part please     Social History:   reports that she quit smoking about 10 years ago. Her smoking use included cigarettes. She has a 10.00 pack-year smoking history. Her smokeless tobacco use includes snuff. She reports current alcohol use of about 3.0 standard drinks of alcohol per week. She reports  that she does not use drugs.   Family History:  Her family history includes Breast cancer (age of onset: 2) in her paternal aunt; Breast cancer (age of onset: 13) in her mother; Cancer in her maternal aunt and mother; Cirrhosis in her father.   Allergies No Known Allergies   Home Medications  Prior to Admission medications   Medication Sig Start Date End Date Taking? Authorizing Provider  acetaminophen (TYLENOL) 325 MG tablet Take 2 tablets (650 mg total) by mouth every 6 (six) hours as needed for mild pain (or Fever >/= 101). Patient taking differently: Take 650 mg by mouth every 4 (four) hours as needed for moderate pain. 12/24/18  Yes Gouru, Illene Silver, MD  albuterol (PROVENTIL HFA;VENTOLIN HFA) 108 (90 Base) MCG/ACT inhaler Inhale 2 puffs into the lungs every 6 (six) hours as needed for wheezing or shortness of breath. 04/24/18  Yes Demetrios Loll, MD  albuterol (PROVENTIL) (2.5 MG/3ML) 0.083% nebulizer solution Take 2.5 mg by nebulization daily as needed. 05/02/22  Yes [provider]  amiodarone (PACERONE) 200 MG tablet Take 1 tablet (200 mg total) by mouth daily. 06/21/22  Yes Lorella Nimrod, MD  apixaban (ELIQUIS) 2.5 MG TABS tablet Take 1 tablet (2.5 mg total) by mouth 2 (two) times daily. 05/26/22  Yes Wyvonnia Dusky, MD  atorvastatin (LIPITOR) 40 MG tablet Take 1 tablet (40 mg total) by mouth daily. 02/03/22  Yes Nicole Kindred A, DO  calcium carbonate (TUMS - DOSED IN MG ELEMENTAL CALCIUM) 500 MG chewable tablet Chew 0.5 tablets by mouth daily as needed.   Yes [provider]  calcium-vitamin D (OSCAL WITH D) 500-5 MG-MCG tablet Take 1 tablet by mouth daily.   Yes [provider]  citalopram (CELEXA) 20 MG tablet Take 1 tablet (20 mg total) by mouth daily. 02/04/22  Yes Nicole Kindred A, DO  ferrous sulfate 325 (65 FE) MG tablet Take 325 mg by mouth 2 (two) times daily with a meal.   Yes [provider]  Fluticasone-Umeclidin-Vilant (TRELEGY ELLIPTA)  100-62.5-25 MCG/ACT AEPB Inhale 1 puff into the lungs daily. 03/05/22  Yes [provider]  hydrALAZINE (APRESOLINE) 10 MG tablet Take 1 tablet (10 mg total) by mouth 3 (three) times daily. 05/26/22  Yes Wyvonnia Dusky, MD  isosorbide dinitrate (ISORDIL) 5 MG tablet Take 1 tablet (5 mg total) by mouth 3 (three) times daily. 05/26/22  Yes Wyvonnia Dusky, MD  loperamide (IMODIUM) 2 MG capsule Take 2 mg by mouth daily as needed for diarrhea or loose stools.   Yes [provider]  metoprolol tartrate (LOPRESSOR) 25 MG tablet Take 1 tablet (25 mg  total) by mouth 2 (two) times daily. Patient taking differently: Take 50 mg by mouth 2 (two) times daily. 02/03/22  Yes Ezekiel Slocumb, DO  Multiple Vitamin (MULTIVITAMIN WITH MINERALS) TABS tablet Take 1 tablet by mouth daily.   Yes [provider]  pantoprazole (PROTONIX) 40 MG tablet Take 1 tablet (40 mg total) by mouth 2 (two) times daily before a meal. 08/05/20  Yes Pokhrel, [place], MD  potassium chloride SA (KLOR-CON M) 20 MEQ tablet Take 2 tablets (40 mEq total) by mouth daily. Patient taking differently: Take 20 mEq by mouth 2 (two) times daily. 06/26/22  Yes Darylene Price A, FNP  predniSONE (DELTASONE) 5 MG tablet Take 5 mg by mouth daily with breakfast. 07/04/22  Yes [provider]  simethicone (MYLICON) 80 MG chewable tablet Chew 1 tablet (80 mg total) by mouth every 6 (six) hours as needed for flatulence. 06/21/22  Yes Lorella Nimrod, MD  sodium bicarbonate 650 MG tablet Take 1,300 mg by mouth 3 (three) times daily.   Yes [provider]  Torsemide 40 MG TABS Take 20 mg by mouth daily. Hold this medication until you see your cardiologist Patient taking differently: Take 40 mg by mouth daily. 05/26/22  Yes Wyvonnia Dusky, MD  zolpidem (AMBIEN) 5 MG tablet Take 5 mg by mouth at bedtime as needed for sleep. 06/23/21  Yes [provider]  OXYGEN Inhale 4 L into the lungs daily.    [provider]  potassium chloride (KLOR-CON) 8 MEQ tablet Take by mouth. Patient not taking: Reported on 07/22/2022 07/05/22 07/05/23  [provider]  sucralfate (CARAFATE) 1 GM/10ML suspension Take 0.5 g by mouth 2 (two) times daily. Patient not taking: Reported on 07/19/2022    [provider]     Critical care time: 90 minutes    Armando Reichert, MD [facility] 08/07/2022 1:47 PM

## 2022-08-07 NOTE — Progress Notes (Addendum)
Daily Progress Note   Patient Name: Danielle [name]       Date: 08/07/2022 DOB: May 13, 1956  Age: 66 y.o. MRN#: [id number] Attending Physician: Sharen Hones, MD Primary Care Physician: [name], [name] Admit Date: 07/29/2022  Reason for Consultation/Follow-up: Establishing goals of care  Subjective: Notes and labs reviewed.  Pigtail drain placed today initially on suction, now to waterseal.  Milrinone is in place as well as high flow cannula.  Per CCM staff, patient is discussing a comfort approach.  In to see patient.    Her daughter and both sons are present at bedside.  Discussed with sons previous conversations that daughter was part of on the phone.  Despite chest tube pigtail placement, as we were speaking, nurse entered to increase liters per minute to around 60 and O2 percentage to 89% (from around 50 liters and 56%) to maintain adequate saturation on high flow cannula.    We discussed her diagnoses, prognosis, GOC, EOL wishes disposition and options.  Created space and opportunity for patient  to explore thoughts and feelings regarding current medical information.   A detailed discussion was had today regarding advanced directives.  Concepts specific to code status, artifical feeding and hydration, IV antibiotics and rehospitalization were discussed.  The difference between an aggressive medical intervention path and a comfort care path was discussed.  Values and goals of care important to patient and family were attempted to be elicited.  Discussed limitations of medical interventions to prolong quality of life in some situations and discussed the concept of human mortality.  Patient states that she is considering comfort care and does not like the idea of going to the hospice  facility but would want to go home with hospice.  She states that she does not want CPR.  She also states that she would not want to be put on a ventilator.  She states she would not want vasopressors. We discussed that given her current status she would have to improve significantly to be stable to discharge out of the hospital to the hospice facility, and due to status and symptom management needs, likely will not be able to go home with hospice if improved.  We discussed comfort care in the hospital and what this would look like.  She states she would like to speak with her  family. She is unsure about dialysis.  Returned to speak with patient. Her daughter is at bedside. Reviewed a MOST form in detail and marked per our conversation. Discussed her thoughts on pressor support and their purpose. She states "oh we can let God take care of that." Confirmed she did not want pressor support.   I completed a MOST form today and the signed original was placed in the chart. The form was scanned and sent to medical records for it to be uploaded under ACP tab in Epic. A photocopy was also placed in the chart to be scanned into EMR. The patient outlined their wishes for the following treatment decisions:  Cardiopulmonary Resuscitation: Do Not Attempt Resuscitation (DNR/No CPR)  Medical Interventions: Limited Additional Interventions: Use medical treatment, IV fluids and cardiac monitoring as indicated, DO NOT USE intubation or mechanical ventilation. May consider use of less invasive airway support such as BiPAP or CPAP. Also provide comfort measures. Transfer to the hospital if indicated. Avoid intensive care. No vasopressors  Antibiotics: Antibiotics if indicated  IV Fluids: IV fluids if indicated  Feeding Tube: No feeding tube       Length of Stay: 20  Current Medications: Scheduled Meds:   amiodarone  200 mg Oral Daily   atorvastatin  40 mg Oral Daily   Chlorhexidine Gluconate Cloth  6 each Topical  Daily   citalopram  20 mg Oral Daily   feeding supplement  1 Container Oral TID BM   lidocaine (PF)  2 mL Intradermal Once   metoprolol tartrate  25 mg Oral Q8H   multivitamin with minerals  1 tablet Oral Daily   pantoprazole  40 mg Oral BID AC   polyethylene glycol  17 g Oral Daily   senna-docusate  2 tablet Oral BID   sodium chloride flush  10 mL Intrapleural Q8H   sodium chloride flush  3 mL Intravenous Q12H   spironolactone  12.5 mg Oral Daily   torsemide  20 mg Oral Daily    Continuous Infusions:  sodium chloride 5 mL/hr at 08/07/22 0500   anticoagulant sodium citrate     milrinone 0.25 mcg/kg/min (08/07/22 0500)    PRN Meds: sodium chloride, acetaminophen **OR** acetaminophen, albuterol, alteplase, anticoagulant sodium citrate, calcium carbonate, guaiFENesin, heparin, HYDROcodone-acetaminophen, lidocaine (PF), lidocaine (PF), lidocaine-prilocaine, melatonin, morphine injection, pentafluoroprop-tetrafluoroeth, simethicone  Physical Exam Pulmonary:     Comments: Effort is normal with no physical activity in bed Neurological:     Mental Status: She is alert.             Vital Signs: BP 112/82   Pulse (!) 117   Temp (!) 97.5 F (36.4 C) (Oral)   Resp (!) 24   Ht 5\' 6"  (1.676 m)   Wt 49.5 kg   SpO2 (!) 83% Comment: Zhang, MD notified  BMI 17.61 kg/m  SpO2: SpO2: (!) 83 % ([place], MD notified) O2 Device: O2 Device: High Flow Nasal Cannula O2 Flow Rate: O2 Flow Rate (L/min): (S) 45 L/min  Intake/output summary:  Intake/Output Summary (Last 24 hours) at 08/07/2022 1216 Last data filed at 08/07/2022 0500 Gross per 24 hour  Intake 158.99 ml  Output --  Net 158.99 ml   LBM: Last BM Date : 08/06/22 Baseline Weight: Weight: 50.3 kg Most recent weight: Weight: 49.5 kg   Patient Active Problem List   Diagnosis Date Noted   Cardiorenal syndrome with renal failure 08/05/2022   Moderate aortic stenosis 08/02/2022   Moderate tricuspid regurgitation by prior  echocardiogram  08/02/2022   Esophageal dysmotility 07/21/2022   Prolonged QT interval 07/19/2022   Streptococcal pneumonia ([place]) 06/20/2022   Dyspnea 05/23/2022   SOB (shortness of breath) 05/22/2022   Chronic respiratory failure with hypoxia (HCC)    Pleural effusion on right 05/08/2022   Paroxysmal atrial fibrillation ([place]) 03/10/2022   Dyslipidemia 03/10/2022   Essential hypertension 03/10/2022   COPD, severe ([place]) 03/10/2022   Atrial fibrillation with RVR ([place]) 02/22/2022   TIA (transient ischemic attack) 01/29/2022   Epistaxis 01/27/2022   C. difficile colitis 11/27/2021   Abdominal pain 11/26/2021   HLD (hyperlipidemia) 11/26/2021   Iron deficiency anemia 11/26/2021   Aspiration pneumonia of right lower lobe due to gastric secretions (HCC)    Ileus following gastrointestinal surgery ([place])    Hypomagnesemia    Anemia of chronic disease    Hyperphosphatemia 10/24/2021   Hypotension 11/17/2020   Ileostomy status ([place]) 11/17/2020   Chronic obstructive pulmonary disease ([place]) 08/01/2020   CKD (chronic kidney disease), stage IV ([place]) 01/20/2020   Hypokalemia 01/20/2020   Elevated troponin 01/20/2020   Sepsis ([place]) 01/20/2020   Acute on chronic combined systolic and diastolic CHF (congestive heart failure) ([place]) 01/20/2020   Pleural effusion due to CHF (congestive heart failure) ([place]) 11/06/2019   Diarrhea    GERD (gastroesophageal reflux disease) 10/29/2019   Depression 10/29/2019   Acute blood loss anemia 06/20/2019   Normocytic anemia 06/20/2019   Anemia of chronic kidney failure, stage 4 (severe) ([place]) 06/12/2019   Malignant neoplasm of lower third of esophagus ([place]) 05/01/2019   Acute on chronic respiratory failure with hypoxia and hypercapnia ([place]) 12/22/2018   Ileostomy dysfunction ([place]) 09/28/2018   Reflux esophagitis    Iron deficiency anemia secondary to blood loss (chronic)    SBO (small bowel obstruction) ([place]) 06/29/2018   Atrial flutter ([place]) 06/09/2018    Lymphedema 06/09/2018   Acute kidney injury superimposed on CKD ([place]) 06/04/2018   Severe protein-calorie malnutrition ([place]) 06/04/2018   Focal (segmental) acute (reversible) ischemia of large intestine (HCC)    Ulceration of intestine    Diverticulosis of large intestine without diverticulitis    Colitis    Malnutrition of moderate degree 04/23/2018   Palliative care by specialist    DNR (do not resuscitate) discussion    Weakness    Acute on chronic respiratory failure with hypoxia ([place]) 04/21/2018   Hyponatremia 02/10/2017   Malignant neoplasm of upper lobe of right lung 07/30/2016   Lung nodule, solitary 07/25/2016   Malignant neoplasm of upper lobe of right lung ([place]) 07/25/2016   Carcinoma of overlapping sites of left breast in female, estrogen receptor positive ([place]) 06/22/2016   Multiple lung nodules 06/22/2016   Lesion of right lung    Breast cancer in female ([place]) 02/21/2016   Moderate COPD (chronic obstructive pulmonary disease) ([place]) 08/24/2014    Palliative Care Assessment & Plan    Recommendations/Plan: Patient confirms no CPR, and no ventilator. Discussed current milrinone and high flow cannula needs. Discussed needing to improve in status to be able to discharge from the hospital with hospice.  Discussed if patient is shifted to comfort care in the hospital, anticipated hospital death.  Code Status:    Code Status Orders  (From admission, onward)           Start     Ordered   07/25/2022 1945  Full code  Continuous        07/16/2022 1946           Code Status  History     Date Active Date Inactive Code Status Order ID Comments User Context   06/17/2022 1013 06/21/2022 1951 Full Code [id number]  Clance Boll, MD ED   05/22/2022 1917 05/26/2022 2026 Full Code [id number]  Para Skeans, MD ED   05/08/2022 1108 05/12/2022 1654 Full Code [id number]  Collier Bullock, MD ED   03/10/2022 0151 03/11/2022 2340 Full Code [id number]  Mansy, Arvella Merles, MD ED   02/21/2022  2006 02/24/2022 1903 Full Code [id number]  Clance Boll, MD ED   01/26/2022 2006 02/04/2022 1500 Full Code [id number]  Clarnce Flock, MD Inpatient   11/26/2021 1623 11/29/2021 2154 Full Code [id number]  Ivor Costa, MD ED   10/23/2021 1800 11/24/2021 1619 Full Code [id number]  Herbert Pun, MD Inpatient   11/17/2020 0432 11/18/2020 [place] Full Code [id number]  Athena Masse, MD ED   11/05/2020 0604 11/10/2020 1631 Full Code [id number]  Benjamine Sprague, DO ED   08/01/2020 1124 08/05/2020 2228 Full Code [id number]  Collier Bullock, MD ED   01/20/2020 0807 01/21/2020 2051 Full Code [id number]  Ivor Costa, MD ED   11/06/2019 1648 11/11/2019 1749 Full Code [id number]  Max Sane, MD ED   10/29/2019 1436 11/04/2019 1859 Full Code [id number]  Ivor Costa, MD ED   10/14/2019 0253 10/21/2019 1810 Full Code [id number]  Athena Masse, MD ED   09/27/2019 0211 10/02/2019 1736 Full Code [id number]  Athena Masse, MD ED   06/19/2019 1124 06/20/2019 1742 Full Code [id number]  Lang Snow, NP ED   12/22/2018 0844 12/24/2018 1727 Full Code [id number]  Harrie Foreman Inpatient   09/28/2018 0813 10/04/2018 1522 Full Code [id number]  Herbert Pun, MD Inpatient   09/05/2018 0616 09/10/2018 1708 Full Code [id number]  Arta Silence, MD ED   08/12/2018 0540 08/16/2018 2057 Full Code [id number]  Arta Silence, MD ED   07/21/2018 0332 07/23/2018 1718 Full Code [id number]  Derrek Monaco, RN Inpatient   07/18/2018 0640 07/21/2018 0330 DNR [id number]  Harrie Foreman Inpatient   07/05/2018 0808 07/09/2018 1946 DNR [id number]  Harrie Foreman Inpatient   06/30/2018 1733 07/02/2018 1507 DNR [id number]  [name], MD Inpatient   06/30/2018 0019 06/30/2018 1733 Full Code [id number]  Amelia Jo, MD Inpatient   06/04/2018 0113 06/05/2018 1448 Full Code [id number]  Amelia Jo, MD Inpatient   04/21/2018 0633 05/17/2018 2214 Full Code [id number]  Arta Silence, MD Inpatient   02/09/2017 1125 02/11/2017 1706  Full Code [id number]  Henreitta Leber, MD Inpatient       Prognosis:  Very poor  Care plan was discussed with CCM  Thank you for allowing the Palliative Medicine Team to assist in the care of this patient.   Asencion Gowda, NP  Please contact Palliative Medicine Team phone at [phone] for questions and concerns.

## 2022-08-07 NOTE — Progress Notes (Signed)
[facility]  PROGRESS NOTE   Subjective:   Patient is continued on milrinone drip.  Niece at bedside. She had right chest tube placed.  1400 cc of fluid was obtained.  She states that her breathing has improved some but, still requires high flow oxygen.  Has not eaten anything today when seen.  No appetite but wanted to eat.  Blood pressure has been low in the 90s.  Heart rate in the 120s, irregular from A-fib.  Milrinone drip continued.  Objective:  Vital signs: Blood pressure (!) 76/59, pulse (!) 104, temperature 97.7 F (36.5 C), temperature source Oral, resp. rate 14, height 5\' 6"  (1.676 m), weight 49.5 kg, SpO2 90 %.  Intake/Output Summary (Last 24 hours) at 08/07/2022 1636 Last data filed at 08/07/2022 1600 Gross per 24 hour  Intake 258.98 ml  Output 1410 ml  Net -1151.02 ml    Filed Weights   08/04/22 0545 08/05/22 0500 08/07/22 0452  Weight: 50.1 kg 49.5 kg 49.5 kg     Physical Exam: General:  No acute distress  Head:  Normocephalic, atraumatic. Moist oral mucosal membranes  Eyes:  Anicteric  Neck:  Supple  Lungs:  Bilateral wheeze.  Also has crackles bilaterally.  High flow nasal cannula oxygen supplementation  Heart:  S1S2 no rubs, irregular, tachycardic, A-fib  Abdomen:   Soft, nontender, bowel sounds present  Extremities: 1+ dependent peripheral edema.  Neurologic:  Awake, alert, following commands  Skin:  No lesions  Access:     Basic Metabolic Panel: Recent Labs  Lab 08/02/22 0356 08/03/22 0353 08/04/22 0523 08/05/22 0326 08/06/22 0431 08/07/22 0601  NA 139 138 137 136 135 135  K 3.6 4.0 3.7 4.7 5.1 3.8  CL 103 100 99 97* 97* 100  CO2 27 26 27 28 27 23   GLUCOSE 96 102* 102* 96 106* 103*  BUN 76* 73* 79* 83* 92* 89*  CREATININE 2.77* 2.88* 3.39* 3.38* 4.21* 4.45*  CALCIUM 8.9 9.1 9.0 8.9 8.9 8.9  MG 2.0 2.0  --  2.2  --   --      CBC: Recent Labs  Lab 08/03/22 0353 08/05/22 0326  WBC 5.6 6.9  HGB 9.6* 9.8*  HCT 29.7*  30.1*  MCV 95.2 96.2  PLT 241 239      Urinalysis: No results for input(s): "COLORURINE", "LABSPEC", "PHURINE", "GLUCOSEU", "HGBUR", "BILIRUBINUR", "KETONESUR", "PROTEINUR", "UROBILINOGEN", "NITRITE", "LEUKOCYTESUR" in the last 72 hours.  Invalid input(s): "APPERANCEUR"    Imaging: DG Chest Port 1 View  Result Date: 08/07/2022 CLINICAL DATA:  Status post thoracostomy tube placement on right EXAM: PORTABLE CHEST 1 VIEW COMPARISON:  08/02/2022 FINDINGS: Cardiac enlargement and aortic atherosclerosis. Right pigtail thoracostomy tube overlies the right lower lung. Decreased volume of the right pleural fluid collection. No pneumothorax identified. Similar appearance of scar versus atelectasis within the right upper lobe. Mild diffuse increase interstitial markings are noted bilaterally which may reflect superimposed edema versus infection. IMPRESSION: 1. Decreased volume of right pleural fluid collection status post thoracostomy tube placement. No pneumothorax. 2. Mild diffuse increase interstitial markings compatible with superimposed edema versus infection. Electronically Signed   By: Kerby Moors M.D.   On: 08/07/2022 09:50     Medications:    sodium chloride 5 mL/hr at 08/07/22 1600   albumin human     anticoagulant sodium citrate     milrinone 0.25 mcg/kg/min (08/07/22 1600)    amiodarone  200 mg Oral Daily   apixaban  2.5 mg Oral BID   atorvastatin  40 mg Oral Daily   Chlorhexidine Gluconate Cloth  6 each Topical Daily   citalopram  20 mg Oral Daily   feeding supplement  1 Container Oral TID BM   lidocaine (PF)  2 mL Intradermal Once   multivitamin with minerals  1 tablet Oral Daily   pantoprazole  40 mg Oral BID AC   polyethylene glycol  17 g Oral Daily   senna-docusate  2 tablet Oral BID   sodium chloride flush  10 mL Intrapleural Q8H   sodium chloride flush  3 mL Intravenous Q12H   spironolactone  12.5 mg Oral Daily   torsemide  20 mg Oral Daily    Assessment/ Plan:      Principal Problem:   Acute on chronic combined systolic and diastolic CHF (congestive heart failure) (HCC) Active Problems:   Carcinoma of overlapping sites of left breast in female, estrogen receptor positive ([place])   Acute on chronic respiratory failure with hypoxia (HCC)   Acute kidney injury superimposed on CKD (HCC)   Depression   CKD (chronic kidney disease), stage IV (HCC)   Chronic obstructive pulmonary disease (HCC)   Hypotension   Hypomagnesemia   Anemia of chronic disease   Abdominal pain   HLD (hyperlipidemia)   Paroxysmal atrial fibrillation (HCC)   Essential hypertension   Pleural effusion on right   Malignant neoplasm of upper lobe of right lung   Prolonged QT interval   Esophageal dysmotility   Moderate aortic stenosis   Moderate tricuspid regurgitation by prior echocardiogram   Cardiorenal syndrome with renal failure  Danielle [name] is a 66 y.o.  female with past medical conditions including diabetes, hypertension, lymphedema, breast cancer COPD and chronic kidney disease.  Patient presents to the emergency department of shortness of breath and has been admitted for Pleural effusion [J90] Status post thoracentesis [Z98.890] CHF exacerbation ([place]) [I50.9] Pleural effusion on right [J90] Acute on chronic combined systolic and diastolic CHF (congestive heart failure) (HCC) [I50.43] Acute on chronic respiratory failure with hypoxia ([place]) [J96.21]   #1: Acute kidney injury on chronic kidney disease stage IIIb.  Baseline creatinine 1.50/GFR 38 from 06/16/2022.: Patient has worsening renal insufficiency secondary to possible cardiorenal syndrome.  She was on IV furosemide drip which was discontinued.  Presently she is on torsemide along with spironolactone and the dose has been reduced.  Renal indices are worsening and she may need to be initiated on renal replacement therapy.  Spoke to the patient at length and she is agreeable to do so. Plan for access-tunneled  dialysis catheter would be preferred but with patient's tenuous respiratory status, it may not be immediately feasible.  Patient required right chest tube placement.  Breathing has improved marginally. -She is agreeable to dialysis but concerned about cardiac status in terms of low blood pressure and tachycardia in the 120s.  Patient would not be able to tolerate dialysis with these cardiac parameters.  Electrolytes levels are acceptable at present.  Volume status is tenuous.  We will monitor carefully for now.  We will reevaluate in morning.   #2: Anemia secondary to chronic kidney disease.  We will continue to monitor closely. Lab Results  Component Value Date   HGB 9.8 (L) 08/05/2022      #3: Lung cancer/respiratory failure/pleural effusion:  Right pleural effusion.  Required right chest tube placement 08/07/2022.    Overall prognosis is poor.  We will continue to monitor.   LOS: [place], MD [facility] 10/24/20234:36 PM

## 2022-08-07 NOTE — Progress Notes (Signed)
[facility] Encounter Note  Patient: Danielle [name] / Admit Date: 08/08/2022 / Date of Encounter: 08/07/2022, 12:49 PM   Subjective: 10/23 no particular significant change in condition at this time.  Patient still has decreased breath sounds in the bases with severe hypoxia requiring high flow air with chronic kidney disease with glomerular filtration rate of 15 slightly improved from days prior LV systolic dysfunction with ejection fraction of 35% continue to reoccurrence of right pleural effusion multifactorial in nature chronic nonvalvular atrial fibrillation with heart rate control and no apparent significant ability to fully remedy all of the above.  Management has been difficult but patient is stable at this time.  10/24 patient continues to have significant shortness of breath with recurrent pleural effusion multifactorial in nature.  This was drained today without complication with improvements of patient's condition.  The patient has remained on appropriate medication management for cardiovascular issues including LV systolic dysfunction atrial fibrillation high intensity cholesterol therapy.  The patient's oxygenation is still significantly concern likely driving above issues  Review of Systems: Positive for: Shortness of breath Negative for: Vision change, hearing change, syncope, dizziness, nausea, vomiting,diarrhea, bloody stool, stomach pain, cough, congestion, diaphoresis, urinary frequency, urinary pain,skin lesions, skin rashes Others previously listed  Objective: Telemetry: Sinus tachycardia Physical Exam: Blood pressure 112/82, pulse (!) 117, temperature (!) 97.5 F (36.4 C), temperature source Oral, resp. rate (!) 24, height 5\' 6"  (1.676 m), weight 49.5 kg, SpO2 (!) 83 %. Body mass index is 17.61 kg/m. General: Well developed, well nourished, in no acute distress. Head: Normocephalic, atraumatic, sclera non-icteric, no xanthomas, nares are without  discharge. Neck: No apparent masses Lungs: Normal respirations with differential differential Fuhs wheezes,, some rhonchi, no rales , basilar crackles decreased right breath sounds  Heart: Regular rate and rhythm, normal S1 S2, no murmur, no rub, no gallop, PMI is normal size and placement, carotid upstroke normal without bruit, jugular venous pressure normal Abdomen: Soft, non-tender, non-distended with normoactive bowel sounds. No hepatosplenomegaly. Abdominal aorta is normal size without bruit Extremities: Trace edema, no clubbing, no cyanosis, no ulcers,  Peripheral: 2+ radial, 2+ femoral, 2+ dorsal pedal pulses Neuro: Alert and oriented. Moves all extremities spontaneously. Psych:  Responds to questions appropriately with a normal affect.   Intake/Output Summary (Last 24 hours) at 08/07/2022 1249 Last data filed at 08/07/2022 0500 Gross per 24 hour  Intake 158.99 ml  Output --  Net 158.99 ml     Inpatient Medications:   amiodarone  200 mg Oral Daily   atorvastatin  40 mg Oral Daily   Chlorhexidine Gluconate Cloth  6 each Topical Daily   citalopram  20 mg Oral Daily   feeding supplement  1 Container Oral TID BM   lidocaine (PF)  2 mL Intradermal Once   metoprolol tartrate  25 mg Oral Q8H   multivitamin with minerals  1 tablet Oral Daily   pantoprazole  40 mg Oral BID AC   polyethylene glycol  17 g Oral Daily   senna-docusate  2 tablet Oral BID   sodium chloride flush  10 mL Intrapleural Q8H   sodium chloride flush  3 mL Intravenous Q12H   spironolactone  12.5 mg Oral Daily   torsemide  20 mg Oral Daily   Infusions:   sodium chloride 5 mL/hr at 08/07/22 0500   anticoagulant sodium citrate     milrinone 0.25 mcg/kg/min (08/07/22 0500)    Labs: Recent Labs    08/05/22 0326 08/06/22 0431 08/07/22 0601  NA 136 135 135  K 4.7 5.1 3.8  CL 97* 97* 100  CO2 28 27 23   GLUCOSE 96 106* 103*  BUN 83* 92* 89*  CREATININE 3.38* 4.21* 4.45*  CALCIUM 8.9 8.9 8.9  MG 2.2   --   --     No results for input(s): "AST", "ALT", "ALKPHOS", "BILITOT", "PROT", "ALBUMIN" in the last 72 hours. Recent Labs    08/05/22 0326  WBC 6.9  HGB 9.8*  HCT 30.1*  MCV 96.2  PLT 239    No results for input(s): "CKTOTAL", "CKMB", "TROPONINI" in the last 72 hours. Invalid input(s): "POCBNP" No results for input(s): "HGBA1C" in the last 72 hours.   Weights: Filed Weights   08/04/22 0545 08/05/22 0500 08/07/22 0452  Weight: 50.1 kg 49.5 kg 49.5 kg     Radiology/Studies:  DG Chest Port 1 View  Result Date: 08/07/2022 CLINICAL DATA:  Status post thoracostomy tube placement on right EXAM: PORTABLE CHEST 1 VIEW COMPARISON:  08/02/2022 FINDINGS: Cardiac enlargement and aortic atherosclerosis. Right pigtail thoracostomy tube overlies the right lower lung. Decreased volume of the right pleural fluid collection. No pneumothorax identified. Similar appearance of scar versus atelectasis within the right upper lobe. Mild diffuse increase interstitial markings are noted bilaterally which may reflect superimposed edema versus infection. IMPRESSION: 1. Decreased volume of right pleural fluid collection status post thoracostomy tube placement. No pneumothorax. 2. Mild diffuse increase interstitial markings compatible with superimposed edema versus infection. Electronically Signed   By: Kerby Moors M.D.   On: 08/07/2022 09:50   DG Chest Port 1 View  Result Date: 08/02/2022 CLINICAL DATA:  Shortness of breath. EXAM: PORTABLE CHEST 1 VIEW COMPARISON:  Chest radiograph dated 08/01/2022. FINDINGS: Diffuse interstitial prominence may represent edema or developing infiltrate. Similar appearance of right pleural effusion and right lung base atelectasis or infiltrate. There is no pneumothorax. Stable cardiac silhouette. Atherosclerotic calcification of the aorta. No acute osseous pathology. IMPRESSION: Similar appearance of right pleural effusion and right lung base atelectasis or infiltrate.  Electronically Signed   By: Anner Crete M.D.   On: 08/02/2022 00:36   CT CHEST WO CONTRAST  Result Date: 08/01/2022 CLINICAL DATA:  Chronic dyspnea. EXAM: CT CHEST WITHOUT CONTRAST TECHNIQUE: Multidetector CT imaging of the chest was performed following the standard protocol without IV contrast. RADIATION DOSE REDUCTION: This exam was performed according to the departmental dose-optimization program which includes automated exposure control, adjustment of the mA and/or kV according to patient size and/or use of iterative reconstruction technique. COMPARISON:  Recent chest CT 07/20/2022 FINDINGS: Cardiovascular: The heart is within normal limits in size and stable. Stable advanced atherosclerotic calcification involving the aorta and coronary arteries. Mediastinum/Nodes: Stable scattered mediastinal and hilar lymph nodes but no mass or overt adenopathy. The esophagus is grossly normal. Lungs/Pleura: Persistent and slightly larger right pleural effusion with overlying atelectasis. Stable probable post treatment changes involving the right upper lobe with dense areas of masslike fibrotic changes. Recommend clinical correlation. Stable severe underlying emphysematous changes and pulmonary scarring. Stable 8 mm superior segment left lower lobe pulmonary nodule on image number 66/3. This has increased in size since the prior study from May 2023 and worrisome for a metastatic focus. Upper Abdomen: No significant upper abdominal findings. Stable severe vascular disease. Stable left hepatic lobe cyst. Musculoskeletal: Status post left mastectomy. No axillary adenopathy. The thyroid gland is grossly normal. The bony thorax is intact. IMPRESSION: 1. Persistent and slightly larger right pleural effusion with overlying atelectasis. 2. Stable probable post  treatment changes involving the right upper lobe with dense areas of masslike fibrotic changes. 3. Stable severe emphysematous changes and pulmonary scarring. 4. 8 mm  superior segment left lower lobe pulmonary nodule enlarged since May of 2023 and worrisome for a metastatic focus. 5. Stable advanced atherosclerotic calcification involving the aorta and coronary arteries. 6. Aortic atherosclerosis. Aortic Atherosclerosis (ICD10-I70.0) and Emphysema (ICD10-J43.9). Electronically Signed   By: Marijo Sanes M.D.   On: 08/01/2022 15:46   DG Chest 2 View  Result Date: 08/01/2022 CLINICAL DATA:  Acute on chronic congestive heart failure. EXAM: CHEST - 2 VIEW COMPARISON:  July 30, 2022. FINDINGS: Mild cardiomegaly is noted. Mild to moderate right pleural effusion is noted with associated atelectasis. Left lung is unremarkable. Bony thorax is unremarkable. IMPRESSION: Mild to moderate right pleural effusion is noted with associated atelectasis. Aortic Atherosclerosis (ICD10-I70.0). Electronically Signed   By: Marijo Conception M.D.   On: 08/01/2022 15:46   DG Chest Port 1 View  Result Date: 07/30/2022 CLINICAL DATA:  Shortness of breath. Pleural effusion. EXAM: PORTABLE CHEST 1 VIEW COMPARISON:  07/29/2022 FINDINGS: Stable enlarged cardiac silhouette, right pleural effusion, mildly prominent interstitial markings, right upper lung zone linear atelectasis or scarring and ill-defined opacity in the right lower lung zone. IMPRESSION: 1. Stable cardiomegaly and mild interstitial lung disease with possible superimposed mild interstitial pulmonary edema. 2. Stable right pleural effusion. 3. Stable right upper lung zone linear atelectasis or scarring and increased density in the right lower lung zone due to the pleural effusion with possible atelectasis or pneumonia. Electronically Signed   By: Claudie Revering M.D.   On: 07/30/2022 11:41   DG Chest Port 1 View  Result Date: 07/29/2022 CLINICAL DATA:  Dyspnea EXAM: PORTABLE CHEST 1 VIEW COMPARISON:  07/28/2022 FINDINGS: Unchanged appearance of right hilum and parahilar masslike opacity. Mild interstitial opacity without overt edema.  Cardiomediastinal contours are normal. Suspect layering right basilar effusion. IMPRESSION: 1. Unchanged appearance of right hilum and parahilar masslike opacity. 2. Suspect layering right basilar effusion. Electronically Signed   By: Ulyses Jarred M.D.   On: 07/29/2022 21:07   DG Chest Port 1 View  Result Date: 07/28/2022 CLINICAL DATA:  Shortness of breath, COPD EXAM: PORTABLE CHEST 1 VIEW COMPARISON:  07/26/2022 FINDINGS: Unchanged AP portable examination. Cardiomegaly with a small right pleural effusion and perihilar masslike consolidation. Mild, diffuse bilateral interstitial pulmonary opacity. Osseous structures unremarkable. IMPRESSION: 1. Unchanged AP portable examination. Cardiomegaly with a small right pleural effusion and perihilar masslike consolidation. 2. Mild, diffuse bilateral interstitial pulmonary opacity, likely edema. No new or focal airspace opacity. Electronically Signed   By: Delanna Ahmadi M.D.   On: 07/28/2022 12:22   DG Chest Port 1 View  Result Date: 07/26/2022 CLINICAL DATA:  Pleural effusion, shortness of breath EXAM: PORTABLE CHEST 1 VIEW COMPARISON:  07/23/2022 FINDINGS: Right pleural effusion is similar to prior study. Right lower lobe airspace opacities also stable. No confluent opacity on the left. Heart is normal size. Aortic atherosclerosis. No acute bony abnormality. IMPRESSION: Stable small right pleural effusion with right lower lobe airspace opacities. Electronically Signed   By: Rolm Baptise M.D.   On: 07/26/2022 14:26   DG Chest Port 1 View  Result Date: 07/23/2022 CLINICAL DATA:  Pleural effusion. EXAM: PORTABLE CHEST 1 VIEW COMPARISON:  Chest radiograph 07/19/2022 FINDINGS: Multiple monitoring leads overlie the patient. Stable cardiomegaly. Aortic atherosclerosis. There appears to be worsening layering right pleural effusion with increased underlying opacities within the right lung. Similar-appearing heterogeneous opacities  right upper lung. Similar appearance  of the left lung. No pneumothorax. Thoracic spine degenerative changes. IMPRESSION: Worsening layering right pleural effusion with underlying opacities which may represent atelectasis or infection. Electronically Signed   By: Lovey Newcomer M.D.   On: 07/23/2022 11:39   DG ESOPHAGUS W SINGLE CM (SOL OR THIN BA)  Result Date: 07/21/2022 CLINICAL DATA:  Patient admitted for worsening shortness of breath. Dysphagia, with solids and liquids sticking in chest. EXAM: ESOPHAGUS/BARIUM SWALLOW/TABLET STUDY TECHNIQUE: Single contrast examination was performed using thin liquid barium. This exam was performed by Narda Rutherford, NP, and was supervised and interpreted by Earle Gell, MD. FLUOROSCOPY: Radiation Exposure Index (as provided by the fluoroscopic device): 70.10 mGy Kerma COMPARISON:  CT AP dated 07/20/2022 FINDINGS: Limited examination due to patient's inability to stand and assume routine positions on fluoroscopy table. Exam was also limited by patient's inability to perform bolus swallowing severe nausea. Swallowing: No vestibular penetration or aspiration seen. Pharynx: Unremarkable. Esophagus: Patient was only able to drink a small amount of barium. This shows severe esophageal dysmotility and stasis of contrast in the esophagus. There was poor filling of the distal esophagus, and patient could not be placed in semi-erect or standing position. However, some barium does pass through the esophagus into the stomach, and no obstructing mass or stricture is identified. Hiatal Hernia: None. Ingested 50mm barium tablet: Not given Other: None. IMPRESSION: Technically limited exam as discussed above. Severe esophageal dysmotility. No evidence of esophageal obstruction. Electronically Signed   By: Marlaine Hind M.D.   On: 07/21/2022 16:36   CT CHEST ABDOMEN PELVIS WO CONTRAST  Result Date: 07/20/2022 CLINICAL DATA:  Abdominal pain, RIGHT lower quadrant pain radiating to LEFT side, acute kidney injury, question sepsis.  History CHF, lung cancer, breast cancer, atrial fibrillation, COPD, former smoker EXAM: CT CHEST, ABDOMEN AND PELVIS WITHOUT CONTRAST TECHNIQUE: Multidetector CT imaging of the chest, abdomen and pelvis was performed following the standard protocol without IV contrast. Patient drank dilute oral contrast for exam. RADIATION DOSE REDUCTION: This exam was performed according to the departmental dose-optimization program which includes automated exposure control, adjustment of the mA and/or kV according to patient size and/or use of iterative reconstruction technique. COMPARISON:  CT angio chest 06/16/2022, CT abdomen and pelvis 05/08/2022 FINDINGS: CT CHEST FINDINGS Cardiovascular: Extensive atherosclerotic calcifications aorta, proximal great vessels, and coronary arteries. Aorta normal caliber. Mild enlargement of cardiac chambers. No pericardial effusion. Mediastinum/Nodes: Esophagus unremarkable. Base of cervical region normal appearance. Prior LEFT mastectomy. No adenopathy. Lungs/Pleura: Small RIGHT pleural effusion. Emphysematous changes with chronic opacity RIGHT upper lobe with spiculated margins, 2.6 x 1.8 cm unchanged. Additional masslike area of opacity in the anterior RIGHT upper lobe extending to the pleural surface measures 4.1 x 1.9 cm, unchanged. Mild compressive atelectasis of RIGHT lower lobe. Subpleural scarring anterolateral LEFT upper lobe suspect related to prior radiation therapy to mastectomy bed, unchanged. No infiltrate identified. Nodular density posteromedial LEFT lower lobe 9 x 6 mm previously 7 x 4 mm, cannot exclude metastasis. Minimal dependent atelectasis LEFT lower lobe. Musculoskeletal: Osseous demineralization without focal bone lesion. CT ABDOMEN PELVIS FINDINGS Hepatobiliary: Question cyst anterior LEFT lobe liver 11 mm unchanged. Additional 6 mm nonspecific low-attenuation focus RIGHT lobe liver image 68 unchanged. Gallbladder and liver otherwise normal appearance. Pancreas:  Normal appearance Spleen: Normal appearance Adrenals/Urinary Tract: Adrenal glands, kidneys, ureters, and bladder normal appearance Stomach/Bowel: Stomach unremarkable. Diverticulosis of descending and sigmoid colon without evidence of diverticulitis. Remaining bowel loops unremarkable. Appendix not visualized. Vascular/Lymphatic: Extensive  atherosclerotic calcifications without aneurysm. Significant atherosclerotic calcifications at the origins of the superior mesenteric and celiac arteries. No adenopathy. Reproductive: Atrophic uterus and adnexa with question small exophytic leiomyoma from anterior uterine wall 18 mm diameter tubal ligation clips. Other: No free air or free fluid. No hernia or inflammatory process. Musculoskeletal: Osseous demineralization. Chronic mild superior endplate compression deformities of L2 and L4. IMPRESSION: Small RIGHT pleural effusion with compressive atelectasis of RIGHT lower lobe, slightly decreased. Stable masslike opacities in the RIGHT upper lobe question related to treated tumor. Slight increase in size of a LEFT lower lobe nodule 9 x 6 mm, cannot exclude metastasis. Distal colonic diverticulosis without evidence of diverticulitis. No acute intra-abdominal or intrapelvic abnormalities. Extensive atherosclerotic calcifications including coronary arteries. Aortic Atherosclerosis (ICD10-I70.0) and Emphysema (ICD10-J43.9). Electronically Signed   By: Lavonia Dana M.D.   On: 07/20/2022 19:05   DG Abd 1 View  Result Date: 07/20/2022 CLINICAL DATA:  Right lower quadrant pain EXAM: ABDOMEN - 1 VIEW COMPARISON:  06/20/2022 FINDINGS: The bowel gas pattern is normal. No radio-opaque calculi or other significant radiographic abnormality are seen. Tubal ligation clips are noted. Aortoiliac atherosclerosis. IMPRESSION: Negative. Electronically Signed   By: Davina Poke D.O.   On: 07/20/2022 15:09   US THORACENTESIS ASP PLEURAL SPACE W/IMG GUIDE  Result Date:  07/19/2022 INDICATION: Patient with history of chronic kidney disease, atrial fibrillation, congestive heart failure and right sided lung cancer status post radiation with recurrent right-sided pleural effusion. EXAM: ULTRASOUND GUIDED RIGHT THORACENTESIS MEDICATIONS: Local 1% lidocaine only. COMPLICATIONS: None immediate. PROCEDURE: An ultrasound guided thoracentesis was thoroughly discussed with the patient and questions answered. The benefits, risks, alternatives and complications were also discussed. The patient understands and wishes to proceed with the procedure. Written consent was obtained. Ultrasound was performed to localize and mark an adequate pocket of fluid in the right chest. The area was then prepped and draped in the normal sterile fashion. 1% Lidocaine was used for local anesthesia. Under ultrasound guidance a 19 gauge, 7-cm, Yueh catheter was introduced. Thoracentesis was performed. The catheter was removed and a dressing applied. FINDINGS: A total of approximately 900 mL of amber colored fluid was removed. IMPRESSION: Successful ultrasound guided right thoracentesis yielding 900 mL of pleural fluid. This exam was performed by Tsosie Billing PA-C, and was supervised and interpreted by Dr. Earleen Newport. Electronically Signed   By: Corrie Mckusick D.O.   On: 07/19/2022 11:43   DG Chest Port 1 View  Result Date: 07/19/2022 CLINICAL DATA:  Post thoracentesis. EXAM: PORTABLE CHEST 1 VIEW COMPARISON:  Radiographs 08/09/2022 and 06/19/2022.  CT 06/16/2022. FINDINGS: 1121 hours. The right pleural effusion appears decreased in volume. There is improved aeration of the right lung base. No evidence of pneumothorax. The heart size and mediastinal contours are stable. The right suprahilar lesion appears grossly stable. No edema or new airspace disease identified. The bones appear unchanged. Telemetry leads overlie the chest. IMPRESSION: Decreased right pleural effusion and improved aeration of the right lung base  following thoracentesis. No evidence of pneumothorax. Electronically Signed   By: Richardean Sale M.D.   On: 07/19/2022 11:39   DG Chest Port 1 View  Result Date: 08/10/2022 CLINICAL DATA:  Short of breath, left breast cancer EXAM: PORTABLE CHEST 1 VIEW COMPARISON:  06/19/2022, 06/16/2022 FINDINGS: Single frontal view of the chest demonstrates stable enlargement of the cardiac silhouette. There is a small right pleural effusion, increased since prior study. Increased consolidation at the right lung base may reflect atelectasis or airspace disease.  Stable linear consolidation right suprahilar region. No pneumothorax. No acute bony abnormality. IMPRESSION: 1. Small right pleural effusion, increased since prior study. 2. Increasing right basilar consolidation, which may reflect airspace disease or progressive atelectasis. 3. Masslike linear consolidation right suprahilar region, unchanged since recent CT. Electronically Signed   By: Randa Ngo M.D.   On: 07/24/2022 18:32     Assessment and Recommendation  66 y.o. female with known paroxysmal nonvalvular atrial fibrillation chronic systolic dysfunction congestive heart failure chronic kidney disease stage IV aortic stenosis COPD recurrent right pleural effusion multifactorial in nature needing continued medication management and stabilization of all above 1.    continue amiodarone at 200 mg each day for maintenance of normal sinus rhythm with telemetry currently showing sinus tachycardia 2.  Okay for continuation of milrinone for acute on chronic systolic dysfunction congestive heart failure but will discontinue when patient has more stabilization from the standpoint of kidney dysfunction and congestive heart failure symptoms 3.  Heparin for further risk reduction in stroke with atrial fibrillation currently in normal sinus rhythm 4.  No further cardiac diagnostics necessary at this time 5.  Continuation of monitoring for recurrent pleural effusion which  appears to be multifactorial in nature 6.  Diuresis as per nephrology for recurrent pulmonary edema pleural effusion and worsening chronic kidney disease with consideration of further intervention as necessary 7.  Continuation of metoprolol for heart rate control and cardiomyopathy 8.  Further treatment consideration of changes after above  Signed, Serafina Royals M.D. FACC

## 2022-08-08 DIAGNOSIS — I959 Hypotension, unspecified: Secondary | ICD-10-CM | POA: Diagnosis not present

## 2022-08-08 DIAGNOSIS — Z7189 Other specified counseling: Secondary | ICD-10-CM | POA: Diagnosis not present

## 2022-08-08 DIAGNOSIS — D638 Anemia in other chronic diseases classified elsewhere: Secondary | ICD-10-CM

## 2022-08-08 DIAGNOSIS — R109 Unspecified abdominal pain: Secondary | ICD-10-CM

## 2022-08-08 DIAGNOSIS — J449 Chronic obstructive pulmonary disease, unspecified: Secondary | ICD-10-CM

## 2022-08-08 DIAGNOSIS — J9621 Acute and chronic respiratory failure with hypoxia: Secondary | ICD-10-CM | POA: Diagnosis not present

## 2022-08-08 DIAGNOSIS — I5043 Acute on chronic combined systolic (congestive) and diastolic (congestive) heart failure: Secondary | ICD-10-CM | POA: Diagnosis not present

## 2022-08-08 DIAGNOSIS — J90 Pleural effusion, not elsewhere classified: Secondary | ICD-10-CM | POA: Diagnosis not present

## 2022-08-08 LAB — BASIC METABOLIC PANEL
Anion gap: 11 (ref 5–15)
BUN: 93 mg/dL — ABNORMAL HIGH (ref 8–23)
CO2: 24 mmol/L (ref 22–32)
Calcium: 8.9 mg/dL (ref 8.9–10.3)
Chloride: 99 mmol/L (ref 98–111)
Creatinine, Ser: 4.95 mg/dL — ABNORMAL HIGH (ref 0.44–1.00)
GFR, Estimated: 9 mL/min — ABNORMAL LOW (ref >60)
Glucose, Bld: 105 mg/dL — ABNORMAL HIGH (ref 70–99)
Potassium: 4.4 mmol/L (ref 3.5–5.1)
Sodium: 134 mmol/L — ABNORMAL LOW (ref 135–145)

## 2022-08-08 LAB — CBC
HCT: 26.9 % — ABNORMAL LOW (ref 36.0–46.0)
Hemoglobin: 8.8 g/dL — ABNORMAL LOW (ref 12.0–15.0)
MCH: 31.2 pg (ref 26.0–34.0)
MCHC: 32.7 g/dL (ref 30.0–36.0)
MCV: 95.4 fL (ref 80.0–100.0)
Platelets: 214 10*3/uL (ref 150–400)
RBC: 2.82 MIL/uL — ABNORMAL LOW (ref 3.87–5.11)
RDW: 15.1 % (ref 11.5–15.5)
WBC: 5.7 10*3/uL (ref 4.0–10.5)
nRBC: 0.5 % — ABNORMAL HIGH (ref 0.0–0.2)

## 2022-08-08 LAB — MAGNESIUM: Magnesium: 2.3 mg/dL (ref 1.7–2.4)

## 2022-08-08 LAB — PHOSPHORUS: Phosphorus: 5.9 mg/dL — ABNORMAL HIGH (ref 2.5–4.6)

## 2022-08-08 MED ORDER — SODIUM CHLORIDE 0.9 % IV BOLUS
250.0000 mL | Freq: Once | INTRAVENOUS | Status: AC
  Administered 2022-08-08: 250 mL via INTRAVENOUS

## 2022-08-08 MED ORDER — ALBUMIN HUMAN 25 % IV SOLN
25.0000 g | Freq: Once | INTRAVENOUS | Status: AC
  Administered 2022-08-08: 25 g via INTRAVENOUS
  Filled 2022-08-08: qty 100

## 2022-08-08 MED ORDER — MIDODRINE HCL 5 MG PO TABS
10.0000 mg | ORAL_TABLET | Freq: Three times a day (TID) | ORAL | Status: DC
  Administered 2022-08-08 – 2022-08-24 (×62): 10 mg via ORAL
  Filled 2022-08-08 (×63): qty 2

## 2022-08-08 MED ORDER — MIDODRINE HCL 5 MG PO TABS
10.0000 mg | ORAL_TABLET | Freq: Three times a day (TID) | ORAL | Status: DC
  Administered 2022-08-08: 10 mg via ORAL
  Filled 2022-08-08: qty 2

## 2022-08-08 NOTE — Progress Notes (Addendum)
1030: Pt with blood pressure of 66/54. Notified Wieting, MD. 269mL bolus ordered and given; Midorine started per order. Goals of care discussions had with pt, Crystal, NP with palliative and MD. Awaiting visitation from family.  1330: Pt with blood pressure of 67/53, in extreme pain on the right side of her back.  Notified Wieting, MD; verbal okay for administration of PRN Morphine for pain.  272mL bolus ordered and given. Family at bedside; visitor restrictions lifted.

## 2022-08-08 NOTE — Progress Notes (Signed)
[facility]  PROGRESS NOTE   Subjective:   Patient seen and evaluated at bedside in ICU Patient alert and oriented, ill-appearing frail Remains on high flow nasal cannula Milrinone drip remains in place. Patient states she is tolerating small meals, no appetite Left-sided chest tube in place Mild lower extremity edema present  Objective:  Vital signs: Blood pressure (!) 78/53, pulse (!) 53, temperature 98 F (36.7 C), temperature source Oral, resp. rate 16, height 5\' 6"  (1.676 m), weight 49.1 kg, SpO2 98 %.  Intake/Output Summary (Last 24 hours) at 08/08/2022 1451 Last data filed at 08/08/2022 1127 Gross per 24 hour  Intake 173.08 ml  Output 540 ml  Net -366.92 ml    Filed Weights   08/05/22 0500 08/07/22 0452 08/08/22 0500  Weight: 49.5 kg 49.5 kg 49.1 kg     Physical Exam: General:  No acute distress, ill-appearing, frail  Head:  Normocephalic, atraumatic. Moist oral mucosal membranes  Eyes:  Anicteric  Lungs:  Bilateral wheeze.  Also has crackles bilaterally.  High flow nasal cannula   Heart:  S1S2 no rubs, irregular, tachycardic, A-fib  Abdomen:   Soft, nontender, bowel sounds present  Extremities: 1+ dependent peripheral edema.  Neurologic:  Awake, alert, following commands  Skin:  No lesions  Access: None    Basic Metabolic Panel: Recent Labs  Lab 08/02/22 0356 08/03/22 0353 08/04/22 0523 08/05/22 0326 08/06/22 0431 08/07/22 0601 08/08/22 0350  NA 139 138 137 136 135 135 134*  K 3.6 4.0 3.7 4.7 5.1 3.8 4.4  CL 103 100 99 97* 97* 100 99  CO2 27 26 27 28 27 23 24   GLUCOSE 96 102* 102* 96 106* 103* 105*  BUN 76* 73* 79* 83* 92* 89* 93*  CREATININE 2.77* 2.88* 3.39* 3.38* 4.21* 4.45* 4.95*  CALCIUM 8.9 9.1 9.0 8.9 8.9 8.9 8.9  MG 2.0 2.0  --  2.2  --   --  2.3  PHOS  --   --   --   --   --   --  5.9*     CBC: Recent Labs  Lab 08/03/22 0353 08/05/22 0326 08/08/22 0350  WBC 5.6 6.9 5.7  HGB 9.6* 9.8* 8.8*  HCT 29.7* 30.1* 26.9*   MCV 95.2 96.2 95.4  PLT 241 239 214      Urinalysis: No results for input(s): "COLORURINE", "LABSPEC", "PHURINE", "GLUCOSEU", "HGBUR", "BILIRUBINUR", "KETONESUR", "PROTEINUR", "UROBILINOGEN", "NITRITE", "LEUKOCYTESUR" in the last 72 hours.  Invalid input(s): "APPERANCEUR"    Imaging: DG Chest Port 1 View  Result Date: 08/07/2022 CLINICAL DATA:  Status post thoracostomy tube placement on right EXAM: PORTABLE CHEST 1 VIEW COMPARISON:  08/02/2022 FINDINGS: Cardiac enlargement and aortic atherosclerosis. Right pigtail thoracostomy tube overlies the right lower lung. Decreased volume of the right pleural fluid collection. No pneumothorax identified. Similar appearance of scar versus atelectasis within the right upper lobe. Mild diffuse increase interstitial markings are noted bilaterally which may reflect superimposed edema versus infection. IMPRESSION: 1. Decreased volume of right pleural fluid collection status post thoracostomy tube placement. No pneumothorax. 2. Mild diffuse increase interstitial markings compatible with superimposed edema versus infection. Electronically Signed   By: Kerby Moors M.D.   On: 08/07/2022 09:50     Medications:    sodium chloride 5 mL/hr at 08/08/22 0900   anticoagulant sodium citrate     milrinone 0.25 mcg/kg/min (08/08/22 0900)    amiodarone  200 mg Oral Daily   apixaban  2.5 mg Oral BID   atorvastatin  40 mg Oral Daily   Chlorhexidine Gluconate Cloth  6 each Topical Daily   citalopram  20 mg Oral Daily   feeding supplement  1 Container Oral TID BM   lidocaine (PF)  2 mL Intradermal Once   midodrine  10 mg Oral TID AC & HS   multivitamin with minerals  1 tablet Oral Daily   pantoprazole  40 mg Oral BID AC   polyethylene glycol  17 g Oral Daily   senna-docusate  2 tablet Oral BID   sodium chloride flush  10 mL Intrapleural Q8H   sodium chloride flush  3 mL Intravenous Q12H    Assessment/ Plan:     Principal Problem:   Acute on chronic  combined systolic and diastolic CHF (congestive heart failure) (HCC) Active Problems:   Carcinoma of overlapping sites of left breast in female, estrogen receptor positive ([place])   Acute on chronic respiratory failure with hypoxia (HCC)   Acute kidney injury superimposed on CKD (HCC)   Depression   CKD (chronic kidney disease), stage IV (HCC)   Chronic obstructive pulmonary disease (HCC)   Hypotension   Hypomagnesemia   Anemia of chronic disease   Abdominal pain   HLD (hyperlipidemia)   Pleural effusion   Paroxysmal atrial fibrillation (HCC)   Pleural effusion on right   Malignant neoplasm of upper lobe of right lung   Prolonged QT interval   Moderate aortic stenosis   Moderate tricuspid regurgitation by prior echocardiogram   Cardiorenal syndrome with renal failure  Danielle [name] is a 66 y.o.  female with past medical conditions including diabetes, hypertension, lymphedema, breast cancer COPD and chronic kidney disease.  Patient presents to the emergency department of shortness of breath and has been admitted for Pleural effusion [J90] Status post thoracentesis [Z98.890] CHF exacerbation ([place]) [I50.9] Pleural effusion on right [J90] Acute on chronic combined systolic and diastolic CHF (congestive heart failure) (HCC) [I50.43] Acute on chronic respiratory failure with hypoxia ([place]) [J96.21]   #1: Acute kidney injury on chronic kidney disease stage IIIb.  Baseline creatinine 1.50/GFR 38 from 06/16/2022.: Patient has worsening renal insufficiency secondary to possible cardiorenal syndrome.  She was on IV furosemide drip which was discontinued.  Presently she is on torsemide along with spironolactone and the dose has been reduced.  Renal function remains decreased along with urine output.  Patient had expressed desires to proceed with hemodialysis however we discussed that due to her hypotension and tachycardia, she is not stable to undergo dialysis at this time.  Recommend continued  discussions with palliative care to determine goals of care.  Patient would make a poor long-term dialysis patient based on her comorbidities.   #2: Anemia secondary to chronic kidney disease.  Hemoglobin below desired.  Continuing to monitor Lab Results  Component Value Date   HGB 8.8 (L) 08/08/2022     #3: Lung cancer/respiratory failure/pleural effusion:  Right pleural effusion.  Required right chest tube placement 08/07/2022.    Overall prognosis remains poor.  Continue discussions with palliative care and family considering comfort measures.   LOS: [facility] 10/25/20232:51 PM

## 2022-08-08 NOTE — Progress Notes (Signed)
  Chaplain On-Call received a call from [employer] who reported that the patient's family will arrive to visit this afternoon, and at that time, they want to speak with a Chaplain regarding comfort care decisions.  Due to shift change, this Chaplain will refer the request to the afternoon Chaplain for follow up support.  Chaplain Pollyann Samples M.Div., [facility]

## 2022-08-08 NOTE — Progress Notes (Signed)
Daily Progress Note   Patient Name: Danielle [name]       Date: 08/08/2022 DOB: [birth year]  Age: 66 y.o. MRN#: [id number] Attending Physician: Loletha Grayer, MD Primary Care Physician: [name], [name] Admit Date: 08/13/2022  Reason for Consultation/Follow-up: Establishing goals of care  Subjective: Notes and labs reviewed.  Notified by care team that discussions are in progress for vasopressor therapy.  In to see patient, nurse is at bedside.  High flow cannula is now at 100% and 60 L.  Pigtail remains to waterseal.  Discussed our conversation yesterday.  Reviewed her thoughts yesterday on not wanting medications that artificially help to sustain her blood pressure, and her statements on allowing God to handle that.  Unable to gain a truly clear picture on her wishes moving forward as her responses seem to be a bit mixed between life prolongation and comfort focused care, even when the same question was asked in different ways.  She request that I speak with her daughter.  Called to speak with her daughter.  Her daughter states that she would prefer comfort focused care for her mother and that her brothers are okay with this plan.  We discussed that patient has been clear that her children's wishes are most important to her.  Daughter states that yesterday after our meeting when sons were leaving, her mother prompted her to go home as well so that she could rest.  Daughter has just had a recent hip surgery.  We discussed patient's concern over daughter's ability to rest, and her comfort while here in the hospital visiting her mother.    Daughter states that her mother is a person of faith, but is afraid of death.  She states she does not have a pastor to come and talk to patient, discussed that  we would have chaplain to see patient if patient is amenable.    Discussed that patient's prognosis is very poor. She states they will come to bedside this afternoon to talk.  Return to speak with patient. Advised her that I spoke with her daughter, and discussed daughters continued wishes for comfort focused care.  Explained that family would be coming to see patient this afternoon.  Length of Stay: 21  Current Medications: Scheduled Meds:   amiodarone  200 mg Oral Daily   apixaban  2.5 mg Oral BID   atorvastatin  40 mg Oral Daily   Chlorhexidine Gluconate Cloth  6 each Topical Daily   citalopram  20 mg Oral Daily   feeding supplement  1 Container Oral TID BM   lidocaine (PF)  2 mL Intradermal Once   midodrine  10 mg Oral TID WC   multivitamin with minerals  1 tablet Oral Daily   pantoprazole  40 mg Oral BID AC   polyethylene glycol  17 g Oral Daily   senna-docusate  2 tablet Oral BID   sodium chloride flush  10 mL Intrapleural Q8H   sodium chloride flush  3 mL Intravenous Q12H    Continuous Infusions:  sodium chloride 5 mL/hr at 08/08/22 0900   anticoagulant sodium citrate     milrinone 0.25 mcg/kg/min (08/08/22 0900)    PRN Meds: sodium chloride, acetaminophen **OR** acetaminophen, albuterol, alteplase, anticoagulant sodium citrate, calcium carbonate, guaiFENesin, heparin, HYDROcodone-acetaminophen, lidocaine (PF), lidocaine (PF), lidocaine-prilocaine, melatonin, morphine injection, pentafluoroprop-tetrafluoroeth, simethicone  Physical Exam Pulmonary:     Effort: Pulmonary effort is normal.  Neurological:     Mental Status: She is alert.             Vital Signs: BP (!) 66/54   Pulse (!) 122   Temp 98 F (36.7 C) (Oral)   Resp 12   Ht 5\' 6"  (1.676 m)   Wt 49.1 kg   SpO2 96%   BMI 17.47 kg/m  SpO2: SpO2: 96 % O2 Device: O2 Device: High Flow Nasal Cannula O2 Flow Rate: O2 Flow Rate (L/min): 60 L/min  Intake/output summary:  Intake/Output Summary (Last 24  hours) at 08/08/2022 1250 Last data filed at 08/08/2022 1127 Gross per 24 hour  Intake 191.3 ml  Output 540 ml  Net -348.7 ml   LBM: Last BM Date : 08/07/22 Baseline Weight: Weight: 50.3 kg Most recent weight: Weight: 49.1 kg     Patient Active Problem List   Diagnosis Date Noted   Cardiorenal syndrome with renal failure 08/05/2022   Moderate aortic stenosis 08/02/2022   Moderate tricuspid regurgitation by prior echocardiogram 08/02/2022   Prolonged QT interval 07/19/2022   Streptococcal pneumonia ([place]) 06/20/2022   Dyspnea 05/23/2022   SOB (shortness of breath) 05/22/2022   Chronic respiratory failure with hypoxia (HCC)    Pleural effusion on right 05/08/2022   Paroxysmal atrial fibrillation ([place]) 03/10/2022   Dyslipidemia 03/10/2022   COPD, severe ([place]) 03/10/2022   Atrial fibrillation with RVR ([place]) 02/22/2022   TIA (transient ischemic attack) 01/29/2022   Epistaxis 01/27/2022   Pleural effusion 01/26/2022   C. difficile colitis 11/27/2021   Abdominal pain 11/26/2021   HLD (hyperlipidemia) 11/26/2021   Iron deficiency anemia 11/26/2021   Aspiration pneumonia of right lower lobe due to gastric secretions (HCC)    Ileus following gastrointestinal surgery (HCC)    Hypomagnesemia    Anemia of chronic disease    Hyperphosphatemia 10/24/2021   Hypotension 11/17/2020   Ileostomy status ([place]) 11/17/2020   Chronic obstructive pulmonary disease ([place]) 08/01/2020   CKD (chronic kidney disease), stage IV ([place]) 01/20/2020   Hypokalemia 01/20/2020   Elevated troponin 01/20/2020   Sepsis ([place]) 01/20/2020   Acute on chronic combined systolic and diastolic CHF (congestive heart failure) (HCC) 01/20/2020   Pleural effusion due to CHF (congestive heart failure) (HCC) 11/06/2019   Diarrhea    GERD (gastroesophageal reflux disease) 10/29/2019   Depression 10/29/2019   Acute blood loss anemia 06/20/2019   Normocytic anemia 06/20/2019   Anemia  of chronic kidney failure, stage 4  (severe) ([place]) 06/12/2019   Malignant neoplasm of lower third of esophagus ([place]) 05/01/2019   Acute on chronic respiratory failure with hypoxia and hypercapnia ([place]) 12/22/2018   Ileostomy dysfunction ([place]) 09/28/2018   Reflux esophagitis    Iron deficiency anemia secondary to blood loss (chronic)    SBO (small bowel obstruction) ([place]) 06/29/2018   Atrial flutter ([place]) 06/09/2018   Lymphedema 06/09/2018   Acute kidney injury superimposed on CKD ([place]) 06/04/2018   Severe protein-calorie malnutrition ([place]) 06/04/2018   Focal (segmental) acute (reversible) ischemia of large intestine (HCC)    Ulceration of intestine    Diverticulosis of large intestine without diverticulitis    Colitis    Malnutrition of moderate degree 04/23/2018   Palliative care by specialist    DNR (do not resuscitate) discussion    Weakness    Acute on chronic respiratory failure with hypoxia ([place]) 04/21/2018   Hyponatremia 02/10/2017   Malignant neoplasm of upper lobe of right lung 07/30/2016   Lung nodule, solitary 07/25/2016   Malignant neoplasm of upper lobe of right lung ([place]) 07/25/2016   Carcinoma of overlapping sites of left breast in female, estrogen receptor positive ([name]) 06/22/2016   Multiple lung nodules 06/22/2016   Lesion of right lung    Breast cancer in female ([place]) 02/21/2016   Moderate COPD (chronic obstructive pulmonary disease) ([place]) 08/24/2014    Palliative Care Assessment & Plan    Recommendations/Plan: BMT will follow. Family would like comfort care and will come in to see patient this afternoon. Anticipate hospital death in any scenario.  Code Status:    Code Status Orders  (From admission, onward)           Start     Ordered   08/07/22 1343  Do not attempt resuscitation (DNR)  Continuous       Question Answer Comment  In the event of cardiac or respiratory ARREST Do not call a "code blue"   In the event of cardiac or respiratory ARREST Do not perform Intubation, CPR,  defibrillation or ACLS   In the event of cardiac or respiratory ARREST Use medication by any route, position, wound care, and other measures to relive pain and suffering. May use oxygen, suction and manual treatment of airway obstruction as needed for comfort.      08/07/22 1342           Code Status History     Date Active Date Inactive Code Status Order ID Comments User Context   07/30/2022 1946 08/07/2022 1342 Full Code [id number]  Athena Masse, MD ED   06/17/2022 1013 06/21/2022 1951 Full Code [id number]  Clance Boll, MD ED   05/22/2022 1917 05/26/2022 2026 Full Code [id number]  Para Skeans, MD ED   05/08/2022 1108 05/12/2022 1654 Full Code [id number]  Collier Bullock, MD ED   03/10/2022 0151 03/11/2022 2340 Full Code [id number]  Mansy, Arvella Merles, MD ED   02/21/2022 2006 02/24/2022 1903 Full Code [id number]  Clance Boll, MD ED   01/26/2022 2006 02/04/2022 1500 Full Code [id number]  Clarnce Flock, MD Inpatient   11/26/2021 1623 11/29/2021 2154 Full Code [id number]  Ivor Costa, MD ED   10/23/2021 1800 11/24/2021 1619 Full Code [id number]  Herbert Pun, MD Inpatient   11/17/2020 0432 11/18/2020 1748 Full Code [id number]  Athena Masse, MD ED   11/05/2020 0604 11/10/2020 1631 Full Code [id number]  Benjamine Sprague, [facility] ED   08/01/2020 1124 08/05/2020 2228  Full Code [id number]  Collier Bullock, MD ED   01/20/2020 0807 01/21/2020 2051 Full Code [id number]  Ivor Costa, MD ED   11/06/2019 1648 11/11/2019 1749 Full Code [id number]  Max Sane, MD ED   10/29/2019 1436 11/04/2019 1859 Full Code [id number]  Ivor Costa, MD ED   10/14/2019 0253 10/21/2019 1810 Full Code [id number]  Athena Masse, MD ED   09/27/2019 0211 10/02/2019 1736 Full Code [id number]  Athena Masse, MD ED   06/19/2019 1124 06/20/2019 1742 Full Code [id number]  Lang Snow, NP ED   12/22/2018 0844 12/24/2018 1727 Full Code [id number]  Harrie Foreman Inpatient   09/28/2018 0813 10/04/2018 1522 Full Code [id number]  Herbert Pun, MD Inpatient   09/05/2018 0616 09/10/2018 1708 Full Code [id number]  Arta Silence, MD ED   08/12/2018 0540 08/16/2018 2057 Full Code [id number]  Arta Silence, MD ED   07/21/2018 0332 07/23/2018 1718 Full Code [id number]  Derrek Monaco, RN Inpatient   07/18/2018 0640 07/21/2018 0330 DNR [id number]  [facility]rie Foreman Inpatient   07/05/2018 0808 07/09/2018 1946 DNR [id number]  [facility]rie Foreman Inpatient   06/30/2018 1733 07/02/2018 1507 DNR [id number]  [name], MD Inpatient   06/30/2018 0019 06/30/2018 1733 Full Code [id number]  Amelia Jo, MD Inpatient   06/04/2018 0113 06/05/2018 1448 Full Code [id number]  Amelia Jo, MD Inpatient   04/21/2018 0633 05/17/2018 2214 Full Code [id number]  Arta Silence, MD Inpatient   02/09/2017 1125 02/11/2017 1706 Full Code [id number]  Henreitta Leber, MD Inpatient       Care plan was discussed with CCM, attending, RN, and chaplain Thank you for allowing the Palliative Medicine Team to assist in the care of this patient.  Asencion Gowda, NP  Please contact Palliative Medicine Team phone at [phone] for questions and concerns.

## 2022-08-08 NOTE — Progress Notes (Signed)
PCCM Brief Progress Note:  S: Patient seen and examined. Reports some phlegm in her throat, but otherwise pain at chest tube insertion site is improved.  O:  Vitals:   08/08/22 0600 08/08/22 0700  BP: (!) 88/65 (!) 77/58  Pulse: (!) 102 (!) 102  Resp: (!) 21 (!) 21  Temp: 98.1 F (36.7 C)   SpO2: 95% 95%   Physical Exam Constitutional:      General: She is in acute distress.     Appearance: She is ill-appearing.  HENT:     Head: Normocephalic.  Eyes:     Extraocular Movements: Extraocular movements intact.  Cardiovascular:     Rate and Rhythm: Tachycardia present. Rhythm irregular.     Heart sounds: Normal heart sounds.  Pulmonary:     Breath sounds: Rales present.     Comments: Decreased breath sounds over the right base Musculoskeletal:     Cervical back: Normal range of motion.  Neurological:     General: No focal deficit present.     Mental Status: She is alert and oriented to person, place, and time.    A/P:  66 year old female with acute on chronic hypoxic respiratory failure with worsening oxygenation in the setting of worsening pleural effusion as well as end stage heart failure, COPD, and CKD.   #Acute Hypoxic Respiratory Failure #COPD #Recurrent Pleural Effusion #HFrEF #CKD #Aortic Stenosis   Patient with a history of lung and esophageal CA (s/p radiation), HFrEF, moderate aortic stenosis, CKD, and COPD with a recurrent right sided pleural effusion (multiple thoracentesis showing an exudate with negative cytology) who has developed worsening hypoxic respiratory failure requiring HFNC support.  She required tube thoracostomy yesterday (69F pigtail) placed in the right pleural space. 1500 mL drained yesterday and the tube was clamped. Tube unclamped this morning and to remain so. Overall, patient's respiratory failure is multi-factorial and due to HFrEF, AS, TR, CKD, COPD, and the pleural effusion. Palliative care has been involved and goals of care actively  discussed, she is currently DNR/DNI and hospice is being considered. Our team will continue to follow along and help guide chest tube management.   -chest tube to water seal -flush chest tube tid -rest of management per primary team  Armando Reichert, MD [facility] 08/08/2022 7:34 AM

## 2022-08-08 NOTE — Progress Notes (Signed)
[facility] Encounter Note  Patient: Danielle [name] / Admit Date: 07/17/2022 / Date of Encounter: 08/08/2022, 12:29 PM   Subjective: 10/23 no particular significant change in condition at this time.  Patient still has decreased breath sounds in the bases with severe hypoxia requiring high flow air with chronic kidney disease with glomerular filtration rate of 15 slightly improved from days prior LV systolic dysfunction with ejection fraction of 35% continue to reoccurrence of right pleural effusion multifactorial in nature chronic nonvalvular atrial fibrillation with heart rate control and no apparent significant ability to fully remedy all of the above.  Management has been difficult but patient is stable at this time.  10/24 patient continues to have significant shortness of breath with recurrent pleural effusion multifactorial in nature.  This was drained today without complication with improvements of patient's condition.  The patient has remained on appropriate medication management for cardiovascular issues including LV systolic dysfunction atrial fibrillation high intensity cholesterol therapy.  The patient's oxygenation is still significantly concern likely driving above issues   10/25.  The patient had some hypotension yesterday with concerns more dehydration and overdiuresis.  The patient was given some hydration with improvements.  The patient does have some tachycardia which appears to be possible atrial flutter and/or fibrillation at this time but good heart rate control.  Patient had thoracentesis yesterday with improved pleural effusion and oxygenation although still with some significant multiorgan issues.  Palliative care has been discussed and the patient may have further progression through these avenues.  The patient is not having any further significant cardiovascular symptoms  Review of Systems: Positive for: Shortness of breath Negative for: Vision change,  hearing change, syncope, dizziness, nausea, vomiting,diarrhea, bloody stool, stomach pain, cough, congestion, diaphoresis, urinary frequency, urinary pain,skin lesions, skin rashes Others previously listed  Objective: Telemetry: Probable atrial fibrillation flutter with rapid ventricular rate Physical Exam: Blood pressure (!) 66/54, pulse (!) 122, temperature 98 F (36.7 C), temperature source Oral, resp. rate 12, height 5\' 6"  (1.676 m), weight 49.1 kg, SpO2 96 %. Body mass index is 17.47 kg/m. General: Well developed, well nourished, in no acute distress. Head: Normocephalic, atraumatic, sclera non-icteric, no xanthomas, nares are without discharge. Neck: No apparent masses Lungs: Normal respirations with differential differential Fuhs wheezes,, some rhonchi, no rales , basilar crackles decreased right breath sounds  Heart: Giller rate and rhythm, normal S1 S2, no murmur, no rub, no gallop, PMI is normal size and placement, carotid upstroke normal without bruit, jugular venous pressure normal Abdomen: Soft, non-tender, non-distended with normoactive bowel sounds. No hepatosplenomegaly. Abdominal aorta is normal size without bruit Extremities: Trace edema, no clubbing, no cyanosis, no ulcers,  Peripheral: 2+ radial, 2+ femoral, 2+ dorsal pedal pulses Neuro: Alert and oriented. Moves all extremities spontaneously. Psych:  Responds to questions appropriately with a normal affect.   Intake/Output Summary (Last 24 hours) at 08/08/2022 1229 Last data filed at 08/08/2022 1127 Gross per 24 hour  Intake 191.3 ml  Output 540 ml  Net -348.7 ml     Inpatient Medications:   amiodarone  200 mg Oral Daily   apixaban  2.5 mg Oral BID   atorvastatin  40 mg Oral Daily   Chlorhexidine Gluconate Cloth  6 each Topical Daily   citalopram  20 mg Oral Daily   feeding supplement  1 Container Oral TID BM   lidocaine (PF)  2 mL Intradermal Once   midodrine  10 mg Oral TID WC   multivitamin with minerals  1 tablet Oral Daily   pantoprazole  40 mg Oral BID AC   polyethylene glycol  17 g Oral Daily   senna-docusate  2 tablet Oral BID   sodium chloride flush  10 mL Intrapleural Q8H   sodium chloride flush  3 mL Intravenous Q12H   Infusions:   sodium chloride 5 mL/hr at 08/08/22 0900   anticoagulant sodium citrate     milrinone 0.25 mcg/kg/min (08/08/22 0900)    Labs: Recent Labs    08/07/22 0601 08/08/22 0350  NA 135 134*  K 3.8 4.4  CL 100 99  CO2 23 24  GLUCOSE 103* 105*  BUN 89* 93*  CREATININE 4.45* 4.95*  CALCIUM 8.9 8.9  MG  --  2.3  PHOS  --  5.9*    No results for input(s): "AST", "ALT", "ALKPHOS", "BILITOT", "PROT", "ALBUMIN" in the last 72 hours. Recent Labs    08/08/22 0350  WBC 5.7  HGB 8.8*  HCT 26.9*  MCV 95.4  PLT 214    No results for input(s): "CKTOTAL", "CKMB", "TROPONINI" in the last 72 hours. Invalid input(s): "POCBNP" No results for input(s): "HGBA1C" in the last 72 hours.   Weights: Filed Weights   08/05/22 0500 08/07/22 0452 08/08/22 0500  Weight: 49.5 kg 49.5 kg 49.1 kg     Radiology/Studies:  DG Chest Port 1 View  Result Date: 08/07/2022 CLINICAL DATA:  Status post thoracostomy tube placement on right EXAM: PORTABLE CHEST 1 VIEW COMPARISON:  08/02/2022 FINDINGS: Cardiac enlargement and aortic atherosclerosis. Right pigtail thoracostomy tube overlies the right lower lung. Decreased volume of the right pleural fluid collection. No pneumothorax identified. Similar appearance of scar versus atelectasis within the right upper lobe. Mild diffuse increase interstitial markings are noted bilaterally which may reflect superimposed edema versus infection. IMPRESSION: 1. Decreased volume of right pleural fluid collection status post thoracostomy tube placement. No pneumothorax. 2. Mild diffuse increase interstitial markings compatible with superimposed edema versus infection. Electronically Signed   By: Kerby Moors M.D.   On: 08/07/2022 09:50    DG Chest Port 1 View  Result Date: 08/02/2022 CLINICAL DATA:  Shortness of breath. EXAM: PORTABLE CHEST 1 VIEW COMPARISON:  Chest radiograph dated 08/01/2022. FINDINGS: Diffuse interstitial prominence may represent edema or developing infiltrate. Similar appearance of right pleural effusion and right lung base atelectasis or infiltrate. There is no pneumothorax. Stable cardiac silhouette. Atherosclerotic calcification of the aorta. No acute osseous pathology. IMPRESSION: Similar appearance of right pleural effusion and right lung base atelectasis or infiltrate. Electronically Signed   By: Anner Crete M.D.   On: 08/02/2022 00:36   CT CHEST WO CONTRAST  Result Date: 08/01/2022 CLINICAL DATA:  Chronic dyspnea. EXAM: CT CHEST WITHOUT CONTRAST TECHNIQUE: Multidetector CT imaging of the chest was performed following the standard protocol without IV contrast. RADIATION DOSE REDUCTION: This exam was performed according to the departmental dose-optimization program which includes automated exposure control, adjustment of the mA and/or kV according to patient size and/or use of iterative reconstruction technique. COMPARISON:  Recent chest CT 07/20/2022 FINDINGS: Cardiovascular: The heart is within normal limits in size and stable. Stable advanced atherosclerotic calcification involving the aorta and coronary arteries. Mediastinum/Nodes: Stable scattered mediastinal and hilar lymph nodes but no mass or overt adenopathy. The esophagus is grossly normal. Lungs/Pleura: Persistent and slightly larger right pleural effusion with overlying atelectasis. Stable probable post treatment changes involving the right upper lobe with dense areas of masslike fibrotic changes. Recommend clinical correlation. Stable severe underlying emphysematous changes and pulmonary  scarring. Stable 8 mm superior segment left lower lobe pulmonary nodule on image number 66/3. This has increased in size since the prior study from May 2023 and  worrisome for a metastatic focus. Upper Abdomen: No significant upper abdominal findings. Stable severe vascular disease. Stable left hepatic lobe cyst. Musculoskeletal: Status post left mastectomy. No axillary adenopathy. The thyroid gland is grossly normal. The bony thorax is intact. IMPRESSION: 1. Persistent and slightly larger right pleural effusion with overlying atelectasis. 2. Stable probable post treatment changes involving the right upper lobe with dense areas of masslike fibrotic changes. 3. Stable severe emphysematous changes and pulmonary scarring. 4. 8 mm superior segment left lower lobe pulmonary nodule enlarged since May of 2023 and worrisome for a metastatic focus. 5. Stable advanced atherosclerotic calcification involving the aorta and coronary arteries. 6. Aortic atherosclerosis. Aortic Atherosclerosis (ICD10-I70.0) and Emphysema (ICD10-J43.9). Electronically Signed   By: Marijo Sanes M.D.   On: 08/01/2022 15:46   DG Chest 2 View  Result Date: 08/01/2022 CLINICAL DATA:  Acute on chronic congestive heart failure. EXAM: CHEST - 2 VIEW COMPARISON:  July 30, 2022. FINDINGS: Mild cardiomegaly is noted. Mild to moderate right pleural effusion is noted with associated atelectasis. Left lung is unremarkable. Bony thorax is unremarkable. IMPRESSION: Mild to moderate right pleural effusion is noted with associated atelectasis. Aortic Atherosclerosis (ICD10-I70.0). Electronically Signed   By: Marijo Conception M.D.   On: 08/01/2022 15:46   DG Chest Port 1 View  Result Date: 07/30/2022 CLINICAL DATA:  Shortness of breath. Pleural effusion. EXAM: PORTABLE CHEST 1 VIEW COMPARISON:  07/29/2022 FINDINGS: Stable enlarged cardiac silhouette, right pleural effusion, mildly prominent interstitial markings, right upper lung zone linear atelectasis or scarring and ill-defined opacity in the right lower lung zone. IMPRESSION: 1. Stable cardiomegaly and mild interstitial lung disease with possible superimposed  mild interstitial pulmonary edema. 2. Stable right pleural effusion. 3. Stable right upper lung zone linear atelectasis or scarring and increased density in the right lower lung zone due to the pleural effusion with possible atelectasis or pneumonia. Electronically Signed   By: Claudie Revering M.D.   On: 07/30/2022 11:41   DG Chest Port 1 View  Result Date: 07/29/2022 CLINICAL DATA:  Dyspnea EXAM: PORTABLE CHEST 1 VIEW COMPARISON:  07/28/2022 FINDINGS: Unchanged appearance of right hilum and parahilar masslike opacity. Mild interstitial opacity without overt edema. Cardiomediastinal contours are normal. Suspect layering right basilar effusion. IMPRESSION: 1. Unchanged appearance of right hilum and parahilar masslike opacity. 2. Suspect layering right basilar effusion. Electronically Signed   By: Ulyses Jarred M.D.   On: 07/29/2022 21:07   DG Chest Port 1 View  Result Date: 07/28/2022 CLINICAL DATA:  Shortness of breath, COPD EXAM: PORTABLE CHEST 1 VIEW COMPARISON:  07/26/2022 FINDINGS: Unchanged AP portable examination. Cardiomegaly with a small right pleural effusion and perihilar masslike consolidation. Mild, diffuse bilateral interstitial pulmonary opacity. Osseous structures unremarkable. IMPRESSION: 1. Unchanged AP portable examination. Cardiomegaly with a small right pleural effusion and perihilar masslike consolidation. 2. Mild, diffuse bilateral interstitial pulmonary opacity, likely edema. No new or focal airspace opacity. Electronically Signed   By: Delanna Ahmadi M.D.   On: 07/28/2022 12:22   DG Chest Port 1 View  Result Date: 07/26/2022 CLINICAL DATA:  Pleural effusion, shortness of breath EXAM: PORTABLE CHEST 1 VIEW COMPARISON:  07/23/2022 FINDINGS: Right pleural effusion is similar to prior study. Right lower lobe airspace opacities also stable. No confluent opacity on the left. Heart is normal size. Aortic atherosclerosis. No acute bony  abnormality. IMPRESSION: Stable small right pleural  effusion with right lower lobe airspace opacities. Electronically Signed   By: Rolm Baptise M.D.   On: 07/26/2022 14:26   DG Chest Port 1 View  Result Date: 07/23/2022 CLINICAL DATA:  Pleural effusion. EXAM: PORTABLE CHEST 1 VIEW COMPARISON:  Chest radiograph 07/19/2022 FINDINGS: Multiple monitoring leads overlie the patient. Stable cardiomegaly. Aortic atherosclerosis. There appears to be worsening layering right pleural effusion with increased underlying opacities within the right lung. Similar-appearing heterogeneous opacities right upper lung. Similar appearance of the left lung. No pneumothorax. Thoracic spine degenerative changes. IMPRESSION: Worsening layering right pleural effusion with underlying opacities which may represent atelectasis or infection. Electronically Signed   By: Lovey Newcomer M.D.   On: 07/23/2022 11:39   DG ESOPHAGUS W SINGLE CM (SOL OR THIN BA)  Result Date: 07/21/2022 CLINICAL DATA:  Patient admitted for worsening shortness of breath. Dysphagia, with solids and liquids sticking in chest. EXAM: ESOPHAGUS/BARIUM SWALLOW/TABLET STUDY TECHNIQUE: Single contrast examination was performed using thin liquid barium. This exam was performed by Narda Rutherford, NP, and was supervised and interpreted by Earle Gell, MD. FLUOROSCOPY: Radiation Exposure Index (as provided by the fluoroscopic device): 70.10 mGy Kerma COMPARISON:  CT AP dated 07/20/2022 FINDINGS: Limited examination due to patient's inability to stand and assume routine positions on fluoroscopy table. Exam was also limited by patient's inability to perform bolus swallowing severe nausea. Swallowing: No vestibular penetration or aspiration seen. Pharynx: Unremarkable. Esophagus: Patient was only able to drink a small amount of barium. This shows severe esophageal dysmotility and stasis of contrast in the esophagus. There was poor filling of the distal esophagus, and patient could not be placed in semi-erect or standing position.  However, some barium does pass through the esophagus into the stomach, and no obstructing mass or stricture is identified. Hiatal Hernia: None. Ingested 28mm barium tablet: Not given Other: None. IMPRESSION: Technically limited exam as discussed above. Severe esophageal dysmotility. No evidence of esophageal obstruction. Electronically Signed   By: Marlaine Hind M.D.   On: 07/21/2022 16:36   CT CHEST ABDOMEN PELVIS WO CONTRAST  Result Date: 07/20/2022 CLINICAL DATA:  Abdominal pain, RIGHT lower quadrant pain radiating to LEFT side, acute kidney injury, question sepsis. History CHF, lung cancer, breast cancer, atrial fibrillation, COPD, former smoker EXAM: CT CHEST, ABDOMEN AND PELVIS WITHOUT CONTRAST TECHNIQUE: Multidetector CT imaging of the chest, abdomen and pelvis was performed following the standard protocol without IV contrast. Patient drank dilute oral contrast for exam. RADIATION DOSE REDUCTION: This exam was performed according to the departmental dose-optimization program which includes automated exposure control, adjustment of the mA and/or kV according to patient size and/or use of iterative reconstruction technique. COMPARISON:  CT angio chest 06/16/2022, CT abdomen and pelvis 05/08/2022 FINDINGS: CT CHEST FINDINGS Cardiovascular: Extensive atherosclerotic calcifications aorta, proximal great vessels, and coronary arteries. Aorta normal caliber. Mild enlargement of cardiac chambers. No pericardial effusion. Mediastinum/Nodes: Esophagus unremarkable. Base of cervical region normal appearance. Prior LEFT mastectomy. No adenopathy. Lungs/Pleura: Small RIGHT pleural effusion. Emphysematous changes with chronic opacity RIGHT upper lobe with spiculated margins, 2.6 x 1.8 cm unchanged. Additional masslike area of opacity in the anterior RIGHT upper lobe extending to the pleural surface measures 4.1 x 1.9 cm, unchanged. Mild compressive atelectasis of RIGHT lower lobe. Subpleural scarring anterolateral LEFT  upper lobe suspect related to prior radiation therapy to mastectomy bed, unchanged. No infiltrate identified. Nodular density posteromedial LEFT lower lobe 9 x 6 mm previously 7 x 4 mm, cannot exclude  metastasis. Minimal dependent atelectasis LEFT lower lobe. Musculoskeletal: Osseous demineralization without focal bone lesion. CT ABDOMEN PELVIS FINDINGS Hepatobiliary: Question cyst anterior LEFT lobe liver 11 mm unchanged. Additional 6 mm nonspecific low-attenuation focus RIGHT lobe liver image 68 unchanged. Gallbladder and liver otherwise normal appearance. Pancreas: Normal appearance Spleen: Normal appearance Adrenals/Urinary Tract: Adrenal glands, kidneys, ureters, and bladder normal appearance Stomach/Bowel: Stomach unremarkable. Diverticulosis of descending and sigmoid colon without evidence of diverticulitis. Remaining bowel loops unremarkable. Appendix not visualized. Vascular/Lymphatic: Extensive atherosclerotic calcifications without aneurysm. Significant atherosclerotic calcifications at the origins of the superior mesenteric and celiac arteries. No adenopathy. Reproductive: Atrophic uterus and adnexa with question small exophytic leiomyoma from anterior uterine wall 18 mm diameter tubal ligation clips. Other: No free air or free fluid. No hernia or inflammatory process. Musculoskeletal: Osseous demineralization. Chronic mild superior endplate compression deformities of L2 and L4. IMPRESSION: Small RIGHT pleural effusion with compressive atelectasis of RIGHT lower lobe, slightly decreased. Stable masslike opacities in the RIGHT upper lobe question related to treated tumor. Slight increase in size of a LEFT lower lobe nodule 9 x 6 mm, cannot exclude metastasis. Distal colonic diverticulosis without evidence of diverticulitis. No acute intra-abdominal or intrapelvic abnormalities. Extensive atherosclerotic calcifications including coronary arteries. Aortic Atherosclerosis (ICD10-I70.0) and Emphysema  (ICD10-J43.9). Electronically Signed   By: Lavonia Dana M.D.   On: 07/20/2022 19:05   DG Abd 1 View  Result Date: 07/20/2022 CLINICAL DATA:  Right lower quadrant pain EXAM: ABDOMEN - 1 VIEW COMPARISON:  06/20/2022 FINDINGS: The bowel gas pattern is normal. No radio-opaque calculi or other significant radiographic abnormality are seen. Tubal ligation clips are noted. Aortoiliac atherosclerosis. IMPRESSION: Negative. Electronically Signed   By: Davina Poke D.O.   On: 07/20/2022 15:09   US THORACENTESIS ASP PLEURAL SPACE W/IMG GUIDE  Result Date: 07/19/2022 INDICATION: Patient with history of chronic kidney disease, atrial fibrillation, congestive heart failure and right sided lung cancer status post radiation with recurrent right-sided pleural effusion. EXAM: ULTRASOUND GUIDED RIGHT THORACENTESIS MEDICATIONS: Local 1% lidocaine only. COMPLICATIONS: None immediate. PROCEDURE: An ultrasound guided thoracentesis was thoroughly discussed with the patient and questions answered. The benefits, risks, alternatives and complications were also discussed. The patient understands and wishes to proceed with the procedure. Written consent was obtained. Ultrasound was performed to localize and mark an adequate pocket of fluid in the right chest. The area was then prepped and draped in the normal sterile fashion. 1% Lidocaine was used for local anesthesia. Under ultrasound guidance a 19 gauge, 7-cm, Yueh catheter was introduced. Thoracentesis was performed. The catheter was removed and a dressing applied. FINDINGS: A total of approximately 900 mL of amber colored fluid was removed. IMPRESSION: Successful ultrasound guided right thoracentesis yielding 900 mL of pleural fluid. This exam was performed by Tsosie Billing PA-C, and was supervised and interpreted by Dr. Earleen Newport. Electronically Signed   By: Corrie Mckusick D.O.   On: 07/19/2022 11:43   DG Chest Port 1 View  Result Date: 07/19/2022 CLINICAL DATA:  Post  thoracentesis. EXAM: PORTABLE CHEST 1 VIEW COMPARISON:  Radiographs 08/05/2022 and 06/19/2022.  CT 06/16/2022. FINDINGS: 1121 hours. The right pleural effusion appears decreased in volume. There is improved aeration of the right lung base. No evidence of pneumothorax. The heart size and mediastinal contours are stable. The right suprahilar lesion appears grossly stable. No edema or new airspace disease identified. The bones appear unchanged. Telemetry leads overlie the chest. IMPRESSION: Decreased right pleural effusion and improved aeration of the right lung base following thoracentesis. No  evidence of pneumothorax. Electronically Signed   By: Richardean Sale M.D.   On: 07/19/2022 11:39   DG Chest Port 1 View  Result Date: 08/11/2022 CLINICAL DATA:  Short of breath, left breast cancer EXAM: PORTABLE CHEST 1 VIEW COMPARISON:  06/19/2022, 06/16/2022 FINDINGS: Single frontal view of the chest demonstrates stable enlargement of the cardiac silhouette. There is a small right pleural effusion, increased since prior study. Increased consolidation at the right lung base may reflect atelectasis or airspace disease. Stable linear consolidation right suprahilar region. No pneumothorax. No acute bony abnormality. IMPRESSION: 1. Small right pleural effusion, increased since prior study. 2. Increasing right basilar consolidation, which may reflect airspace disease or progressive atelectasis. 3. Masslike linear consolidation right suprahilar region, unchanged since recent CT. Electronically Signed   By: Randa Ngo M.D.   On: 08/11/2022 18:32     Assessment and Recommendation  66 y.o. female with known paroxysmal nonvalvular atrial fibrillation chronic systolic dysfunction congestive heart failure chronic kidney disease stage IV aortic stenosis COPD recurrent right pleural effusion multifactorial in nature needing continued medication management and stabilization of all above 1.    continue amiodarone at 200 mg each  day for heart rate control at this time until further discussion and potential for palliative care but will likely remain on oral medication management as outpatient 2.  Okay for continuation of milrinone for acute on chronic systolic dysfunction congestive heart failure but will discontinue when patient has more stabilization from the standpoint of kidney dysfunction and congestive heart failure symptoms 3.  Eliquis for further risk reduction in stroke with atrial fibrillation at current dose.   4.  No further cardiac diagnostics necessary at this time 5.  Continuation of monitoring for pleural effusion although currently improved after yesterday's procedure 6.  Hold any further diuresis due to concerns hypotension 7.  Currently holding metoprolol due to hypotension and this is likely why the patient does have slightly faster heart rate  8.  Awaiting palliative care treatment options and discussion with family  Signed, Serafina Royals M.D. FACC

## 2022-08-08 NOTE — TOC Progression Note (Signed)
Transition of Care [facility]) - Progression Note    Patient Details  Name: Danielle [name] MRN: [id number] Date of Birth: 29-Oct-1955  [facility] CM/SW Contact  Shelbie Hutching, RN Phone Number: 08/08/2022, 5:10 PM  Clinical Narrative:    TOC will follow for plan of care and assist with disposition.  Palliative is following and family wants patient to be comfortable.     Expected Discharge Plan: Home/Self Care Barriers to Discharge: Continued Medical Work up  Expected Discharge Plan and Services Expected Discharge Plan: Home/Self Care   Discharge Planning Services: CM Consult   Living arrangements for the past 2 months: Single Family Home                                       Social Determinants of Health (SDOH) Interventions    Readmission Risk Interventions    05/09/2022    3:11 PM 01/29/2022   11:36 AM 11/29/2021   10:49 AM  Readmission Risk Prevention Plan  Transportation Screening Complete Complete Complete  Medication Review [employer] photographer) Complete Complete Complete  PCP or Specialist appointment within 3-5 days of discharge  Complete Complete  HRI or Home Care Consult Patient refused  Complete  SW Recovery Care/Counseling Consult Complete Complete Complete  Palliative Care Screening Not Applicable Not Applicable Not [place] Not Applicable Not Applicable Not Applicable

## 2022-08-08 NOTE — Progress Notes (Signed)
Progress Note   Patient: Danielle [name] OXB:[id number] DOB: [birth year] DOA: 08/04/2022     21 DOS: the patient was seen and examined on 08/08/2022   Brief hospital course: SIMRAT KENDRICK is a 66 y.o. 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    ED course and evaluation --- BP 135/99 with RR 29 with O2 sat 90% on increased O2 flow rate of 5 L/min.  Labs with troponin 15>>17, BNP 2935.  Treated with IV Lasix in the ED.  Chest x-ray showed -- Small right pleural effusion, increased since prior study.  Increasing right basilar consolidation, which may reflect airspace disease or progressive atelectasis.  Mass-like linear consolidation right suprahilar region, unchanged since recent CT.  Patient was admitted to hospitalist service and IV diuresis was continued.    IR was consulted for thoracentesis which was done on 10/5 with 900 cc fluid removed.   Hospital course complicated by: --Acute kidney injury after diuresis due to cardiorenal physiology.  Nephrology was consulted.   Renal function improved with diuretics held, but pt with persistent and worsening exertional dyspnea again. --Abdominal pain for which evaluation including abdominal xray and CT abdomen/pelvis was unremarkable. --Swallowing difficulties with severe esophageal dysmotility discovered on barium esophagram.    10/19.  Patient developed  worsening hypoxemia, currently on high flow oxygen.  We will place a Pleurx catheter as patient pleural effusion has reaccumulated, planned on Monday. Also restarted Lasix drip with milrinone after cardiology consult. 10/20.  Lasix discontinued as patient renal function is getting worse. 10/24.  Paracentesis performed, hemodialysis is planned.   10/25.  Called with blood pressure in the 60s.  Fluid bolus given.  Lasix and spironolactone discontinued.  We will start midodrine.  Yesterday with palliative care they did not want pressors.  Called patient's daughter with multiorgan failure and increasing oxygen requirements.  Case discussed with palliative care.     Assessment and Plan: * Acute on chronic combined systolic and diastolic CHF (congestive heart failure) (HCC) Echo 01/30/22 -- EF 35-40%, grade II diastolic dysfunction, moderate MR, mod-severe TR, moderate AS. Patient currently on milrinone drip.  Overall prognosis is poor with hypotension.  Limited with medications.  Discontinue other medications that can lower blood pressure.   Hypotension Patient and family did not want pressors yesterday.  We will give a fluid bolus and start midodrine.   Acute on chronic respiratory failure with hypoxia (HCC) Baseline on 4 L/min continuous O2. Patient currently on heated high flow nasal cannula 100% FiO2 with 60 L flow.  Pleural effusion on right  Chart reviewed: prior thoracenteses done this year on 1/30, 2/8, 4/16, 5/12, 7/25, 8/8, 9/5 --Underwent thoracentesis on 10/5 with 900 cc fluid removed. --Currently has a right-sided chest tube.  Paroxysmal atrial fibrillation (HCC) --Continue amiodarone and Elqius  Acute kidney injury superimposed on CKD (HCC) Creatinine continues to worsen with creatinine 4.95. Acute kidney injury on CKD stage IIIb.  Nephrology was considering dialysis.  CKD (chronic kidney disease), stage IV ([place]) Developed AKI likely due  to diuresis, cardiorenal  physiology. Renal function improved with holding diuretics See AKI for plan. Nephrology following.  Prolonged QT interval Continue to monitor on telemetry.  Malignant neoplasm of upper lobe of right lung Malignant neoplasm of lower third of esophagus Patient is status post radiation therapy for esophageal cancer S/p radiation therapy for lung cancer   HLD (hyperlipidemia) On Lipitor  Abdominal pain Continue to monitor.  Anemia of chronic disease Last hemoglobin 8.8  Hypomagnesemia Magnesium 2.1 from 1.6, replaced 10/7. Maintain Mg > 2.0 given history of QTc prolongation and A-fib. Monitor & replace PRN  Chronic obstructive pulmonary disease (HCC) Severe COPD with chronic respiratory failure on 4 L of continuous O2 at home.   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 No acute issues.        Subjective: Patient does have some pain where the chest tube is in.  With some shortness of breath.  Increasing oxygen requirements to 100% high flow nasal cannula.  Not urinating.  Some abdominal pain.  Physical Exam: Vitals:   08/08/22 0838 08/08/22 0900 08/08/22 1000 08/08/22 1015  BP:  (!) 71/57 (!) 61/52 (!) 66/54  Pulse:  (!) 124 (!) 121 (!) 122  Resp:  19 14 12   Temp:      TempSrc:      SpO2: 93% 97% 95% 96%  Weight:      Height:       Physical Exam HENT:     Head: Normocephalic.     Mouth/Throat:     Pharynx: No oropharyngeal exudate.  Eyes:     General: Lids are normal.     Conjunctiva/sclera: Conjunctivae normal.  Cardiovascular:     Rate and Rhythm: Regular rhythm. Tachycardia present.     Heart sounds: Normal heart sounds, S1 normal and S2 normal.  Pulmonary:     Breath sounds: Examination of the right-lower field reveals decreased breath sounds. Examination of the left-lower field reveals decreased breath sounds. Decreased breath sounds present. No  wheezing, rhonchi or rales.  Abdominal:     Palpations: Abdomen is soft.     Tenderness: There is generalized abdominal tenderness.  Musculoskeletal:     Right lower leg: Swelling present.     Left lower leg: Swelling present.  Skin:    General: Skin is warm.     Findings: No rash.  Neurological:     Mental Status: She is alert and oriented to person, place, and time.     Data Reviewed: Creatinine 4.95, sodium 134, BUN 93, hemoglobin 8.8  Family Communication: Updated patient's daughter on the phone to come in and visit with multiorgan failure  Disposition: Status is: Inpatient Remains inpatient appropriate because: Overall prognosis poor secondary to multiorgan failure.  Increasing oxygen requirements 100% heated high flow nasal cannula 60 L flow.  Planned Discharge Destination: To be determined based on clinical course.  May end up progressing to comfort care.    Time spent: 40 minutes Case discussed with palliative care and nursing staff  Author: Loletha Grayer, MD 08/08/2022 12:05 PM  For on call review [email or web address].

## 2022-08-08 NOTE — Progress Notes (Signed)
   08/08/22 1400  Clinical Encounter Type  Visited With Patient and family together  Visit Type Initial  Referral From Chaplain  Consult/Referral To [facility] responded to request from Chaplain and Palliative care to visit patient. At bedside, patient did not engage in conversation but did respond when asked if she wanted prayer. Chaplain facilitated this and stayed at bedside with little conversation.

## 2022-08-09 DIAGNOSIS — Z7189 Other specified counseling: Secondary | ICD-10-CM | POA: Diagnosis not present

## 2022-08-09 DIAGNOSIS — N179 Acute kidney failure, unspecified: Secondary | ICD-10-CM | POA: Diagnosis not present

## 2022-08-09 DIAGNOSIS — R9431 Abnormal electrocardiogram [ECG] [EKG]: Secondary | ICD-10-CM

## 2022-08-09 DIAGNOSIS — E43 Unspecified severe protein-calorie malnutrition: Secondary | ICD-10-CM

## 2022-08-09 DIAGNOSIS — F32A Depression, unspecified: Secondary | ICD-10-CM

## 2022-08-09 DIAGNOSIS — I5043 Acute on chronic combined systolic (congestive) and diastolic (congestive) heart failure: Secondary | ICD-10-CM | POA: Diagnosis not present

## 2022-08-09 DIAGNOSIS — J9621 Acute and chronic respiratory failure with hypoxia: Secondary | ICD-10-CM | POA: Diagnosis not present

## 2022-08-09 DIAGNOSIS — I959 Hypotension, unspecified: Secondary | ICD-10-CM | POA: Diagnosis not present

## 2022-08-09 LAB — CBC
HCT: 28.7 % — ABNORMAL LOW (ref 36.0–46.0)
Hemoglobin: 9.4 g/dL — ABNORMAL LOW (ref 12.0–15.0)
MCH: 31.1 pg (ref 26.0–34.0)
MCHC: 32.8 g/dL (ref 30.0–36.0)
MCV: 95 fL (ref 80.0–100.0)
Platelets: 236 10*3/uL (ref 150–400)
RBC: 3.02 MIL/uL — ABNORMAL LOW (ref 3.87–5.11)
RDW: 15.1 % (ref 11.5–15.5)
WBC: 7.2 10*3/uL (ref 4.0–10.5)
nRBC: 0.3 % — ABNORMAL HIGH (ref 0.0–0.2)

## 2022-08-09 LAB — BASIC METABOLIC PANEL
Anion gap: 11 (ref 5–15)
BUN: 91 mg/dL — ABNORMAL HIGH (ref 8–23)
CO2: 23 mmol/L (ref 22–32)
Calcium: 8.8 mg/dL — ABNORMAL LOW (ref 8.9–10.3)
Chloride: 100 mmol/L (ref 98–111)
Creatinine, Ser: 4.88 mg/dL — ABNORMAL HIGH (ref 0.44–1.00)
GFR, Estimated: 9 mL/min — ABNORMAL LOW (ref >60)
Glucose, Bld: 105 mg/dL — ABNORMAL HIGH (ref 70–99)
Potassium: 4.4 mmol/L (ref 3.5–5.1)
Sodium: 134 mmol/L — ABNORMAL LOW (ref 135–145)

## 2022-08-09 LAB — TRIGLYCERIDES, BODY FLUIDS: Triglycerides, Fluid: 20 mg/dL

## 2022-08-09 LAB — MAGNESIUM: Magnesium: 2.5 mg/dL — ABNORMAL HIGH (ref 1.7–2.4)

## 2022-08-09 LAB — PHOSPHORUS: Phosphorus: 6 mg/dL — ABNORMAL HIGH (ref 2.5–4.6)

## 2022-08-09 NOTE — Progress Notes (Signed)
Progress Note   Patient: Danielle [name] WRU:[id number] DOB: 06/13/1956 DOA: 07/21/2022     22 DOS: the patient was seen and examined on 08/09/2022   Brief hospital course: Danielle [name] is a 66 y.o. 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    ED course and evaluation --- BP 135/99 with RR 29 with O2 sat 90% on increased O2 flow rate of 5 L/min.  Labs with troponin 15>>17, BNP 2935.  Treated with IV Lasix in the ED.  Chest x-ray showed -- Small right pleural effusion, increased since prior study.  Increasing right basilar consolidation, which may reflect airspace disease or progressive atelectasis.  Mass-like linear consolidation right suprahilar region, unchanged since recent CT.  Patient was admitted to hospitalist service and IV diuresis was continued.    IR was consulted for thoracentesis which was done on 10/5 with 900 cc fluid removed.   Hospital course complicated by: --Acute kidney injury after diuresis due to cardiorenal physiology.  Nephrology was consulted.   Renal function improved with diuretics held, but pt with persistent and worsening exertional dyspnea again. --Abdominal pain for which evaluation including abdominal xray and CT abdomen/pelvis was unremarkable. --Swallowing difficulties with severe esophageal dysmotility discovered on barium esophagram.    10/19.  Patient developed  worsening hypoxemia, currently on high flow oxygen.  We will place a Pleurx catheter as patient pleural effusion has reaccumulated, planned on Monday. Also restarted Lasix drip with milrinone after cardiology consult. 10/20.  Lasix discontinued as patient renal function is getting worse. 10/24.  Paracentesis performed, hemodialysis is planned.   10/25.  Called with blood pressure in the 60s.  Fluid bolus given.  Lasix and spironolactone discontinued.  We will start midodrine.  Yesterday with palliative care they did not want pressors.  Called patient's daughter with multiorgan failure and increasing oxygen requirements.  Case discussed with palliative care.     Assessment and Plan: * Acute on chronic combined systolic and diastolic CHF (congestive heart failure) (HCC) Echo 01/30/22 -- EF 35-40%, grade II diastolic dysfunction, moderate MR, mod-severe TR, moderate AS. Patient currently on milrinone drip.  Overall prognosis is poor with hypotension.  Limited with medications.  Discontinued other medications that can lower blood pressure.   Hypotension Midodrine 10 mg 4 times a day.   Acute on chronic respiratory failure with hypoxia (HCC) Baseline on 4 L/min continuous O2. Patient currently on heated high flow nasal cannula 85% FiO2 with 50 L flow.  Pleural effusion on right  Chart reviewed: prior thoracenteses done this year on 1/30, 2/8, 4/16, 5/12, 7/25, 8/8, 9/5 --Underwent thoracentesis on 10/5 with 900 cc fluid removed. --Currently has a right-sided chest tube.  Paroxysmal atrial fibrillation (HCC) --Continue amiodarone and Elqius.  Unable to tolerate beta-blocker secondary to hypotension.  Acute kidney injury superimposed on CKD (HCC) Creatinine 4.88 today. Acute kidney injury on CKD stage IIIb.  Nephrology following.  Severe protein-calorie malnutrition ([place]) Encouraged to eat.  Patient refused NG tube.    Prolonged QT interval Continue to  monitor on  telemetry.  Malignant neoplasm of upper lobe of right lung Malignant neoplasm of lower third of esophagus Patient is status post radiation therapy for esophageal cancer S/p radiation therapy for lung cancer   HLD (hyperlipidemia) On Lipitor  Abdominal pain Continue to monitor.  Anemia of chronic disease Last hemoglobin 9.4  Hypomagnesemia Replaced  Chronic obstructive pulmonary disease (HCC) Severe COPD with chronic respiratory failure on 4 L of continuous O2 at home.   Depression Continue Celexa 20 mg daily  Carcinoma of overlapping sites of left breast in female, estrogen receptor positive ([place]) Noted. No acute issues.   Nutrition.  I encouraged patient to eat.  She refused endometrial placement     Subjective: Patient feels okay.  A little shortness of breath.  Slight discomfort with the chest tube.  Admitted 21 days ago with recurrent right pleural effusion.  Physical Exam: Vitals:   08/09/22 0800 08/09/22 0900 08/09/22 1000 08/09/22 1100  BP: 92/60 91/74 93/65  (!) 89/65  Pulse: (!) 112 (!) 122 (!) 121 (!) 120  Resp: 12 11 17 13   Temp: 98 F (36.7 C)     TempSrc: Oral     SpO2: 93% 92% 93% 93%  Weight:      Height:       Physical Exam HENT:     Head: Normocephalic.     Mouth/Throat:     Pharynx: No oropharyngeal exudate.  Eyes:     General: Lids are normal.     Conjunctiva/sclera: Conjunctivae normal.  Cardiovascular:     Rate and Rhythm: Regular rhythm. Tachycardia present.     Heart sounds: Normal heart sounds, S1 normal and S2 normal.  Pulmonary:     Breath sounds: Examination of the right-lower field reveals decreased breath sounds. Examination of the left-lower field reveals decreased breath sounds. Decreased breath sounds present. No wheezing, rhonchi or rales.  Abdominal:     Palpations: Abdomen is soft.     Tenderness: There is generalized abdominal tenderness.  Musculoskeletal:     Right lower leg: Swelling present.     Left lower leg:  Swelling present.  Skin:    General: Skin is warm.     Findings: No rash.  Neurological:     Mental Status: She is alert and oriented to person, place, and time.     Data Reviewed: Creatinine 4.88, sodium 134, hemoglobin 9.4  Family Communication: Updated Daughter on the phone  Disposition: Status is: Inpatient Remains inpatient appropriate because: Still on heated high flow nasal cannula 85% FiO2 and 50 L flow.  Planned Discharge Destination: To be determined based on clinical course    Time spent: 28 minutes  Author: Loletha Grayer, MD 08/09/2022 12:32 PM  For on call review [email or web address].

## 2022-08-09 NOTE — Assessment & Plan Note (Addendum)
Failure to thrive.

## 2022-08-09 NOTE — Progress Notes (Signed)
[facility]  PROGRESS NOTE   Subjective:   Patient seen and evaluated at bedside in ICU Patient seen resting comfortably, no family at bedside Remains on high flow nasal cannula, denies shortness of breath Reports dyspnea with exertion Tolerating very small meals, poor appetite persist  Creatinine stable-4.88 Remains oliguric, 300 mL overnight.  Objective:  Vital signs: Blood pressure 92/60, pulse (!) 112, temperature 98 F (36.7 C), temperature source Oral, resp. rate 12, height 5\' 6"  (1.676 m), weight 48.7 kg, SpO2 93 %.  Intake/Output Summary (Last 24 hours) at 08/09/2022 1049 Last data filed at 08/09/2022 0820 Gross per 24 hour  Intake 283.96 ml  Output 850 ml  Net -566.04 ml    Filed Weights   08/07/22 0452 08/08/22 0500 08/09/22 0500  Weight: 49.5 kg 49.1 kg 48.7 kg     Physical Exam: General:  No acute distress, ill-appearing  Head:  Normocephalic, atraumatic. Moist oral mucosal membranes  Eyes:  Anicteric  Lungs:  Bilateral wheeze.  Also has crackles bilaterally.  High flow nasal cannula   Heart:  S1S2 no rubs, irregular, tachycardic, A-fib  Abdomen:   Soft, nontender, bowel sounds present  Extremities: 1+ dependent peripheral edema.  Neurologic:  Awake, alert, following commands  Skin:  No lesions  Access: None    Basic Metabolic Panel: Recent Labs  Lab 08/03/22 0353 08/04/22 0523 08/05/22 0326 08/06/22 0431 08/07/22 0601 08/08/22 0350 08/09/22 0431  NA 138   < > 136 135 135 134* 134*  K 4.0   < > 4.7 5.1 3.8 4.4 4.4  CL 100   < > 97* 97* 100 99 100  CO2 26   < > 28 27 23 24 23   GLUCOSE 102*   < > 96 106* 103* 105* 105*  BUN 73*   < > 83* 92* 89* 93* 91*  CREATININE 2.88*   < > 3.38* 4.21* 4.45* 4.95* 4.88*  CALCIUM 9.1   < > 8.9 8.9 8.9 8.9 8.8*  MG 2.0  --  2.2  --   --  2.3 2.5*  PHOS  --   --   --   --   --  5.9* 6.0*   < > = values in this interval not displayed.     CBC: Recent Labs  Lab 08/03/22 0353 08/05/22 0326  08/08/22 0350 08/09/22 0431  WBC 5.6 6.9 5.7 7.2  HGB 9.6* 9.8* 8.8* 9.4*  HCT 29.7* 30.1* 26.9* 28.7*  MCV 95.2 96.2 95.4 95.0  PLT 241 239 214 236      Urinalysis: No results for input(s): "COLORURINE", "LABSPEC", "PHURINE", "GLUCOSEU", "HGBUR", "BILIRUBINUR", "KETONESUR", "PROTEINUR", "UROBILINOGEN", "NITRITE", "LEUKOCYTESUR" in the last 72 hours.  Invalid input(s): "APPERANCEUR"    Imaging: No results found.   Medications:    sodium chloride 5 mL/hr at 08/09/22 0800   anticoagulant sodium citrate     milrinone 0.25 mcg/kg/min (08/09/22 0800)    amiodarone  200 mg Oral Daily   apixaban  2.5 mg Oral BID   atorvastatin  40 mg Oral Daily   Chlorhexidine Gluconate Cloth  6 each Topical Daily   citalopram  20 mg Oral Daily   feeding supplement  1 Container Oral TID BM   lidocaine (PF)  2 mL Intradermal Once   midodrine  10 mg Oral TID AC & HS   multivitamin with minerals  1 tablet Oral Daily   pantoprazole  40 mg Oral BID AC   polyethylene glycol  17 g Oral Daily  senna-docusate  2 tablet Oral BID   sodium chloride flush  10 mL Intrapleural Q8H   sodium chloride flush  3 mL Intravenous Q12H    Assessment/ Plan:     Principal Problem:   Acute on chronic combined systolic and diastolic CHF (congestive heart failure) (HCC) Active Problems:   Carcinoma of overlapping sites of left breast in female, estrogen receptor positive ([place])   Acute on chronic respiratory failure with hypoxia (HCC)   Acute kidney injury superimposed on CKD (HCC)   Depression   CKD (chronic kidney disease), stage IV (HCC)   Chronic obstructive pulmonary disease (HCC)   Hypotension   Hypomagnesemia   Anemia of chronic disease   Abdominal pain   HLD (hyperlipidemia)   Pleural effusion   Paroxysmal atrial fibrillation (HCC)   Pleural effusion on right   Malignant neoplasm of upper lobe of right lung   Prolonged QT interval   Moderate aortic stenosis   Moderate tricuspid regurgitation by  prior echocardiogram   Cardiorenal syndrome with renal failure  [name] is a 66 y.o.  female with past medical conditions including diabetes, hypertension, lymphedema, breast cancer COPD and chronic kidney disease.  Patient presents to the emergency department of shortness of breath and has been admitted for Pleural effusion [J90] Status post thoracentesis [Z98.890] CHF exacerbation ([place]) [I50.9] Pleural effusion on right [J90] Acute on chronic combined systolic and diastolic CHF (congestive heart failure) (HCC) [I50.43] Acute on chronic respiratory failure with hypoxia ([place]) [J96.21]   #1: Acute kidney injury on chronic kidney disease stage IIIb.  Baseline creatinine 1.50/GFR 38 from 06/16/2022.: Patient has worsening renal insufficiency secondary to possible cardiorenal syndrome.  She was on IV furosemide drip which was discontinued.  Presently she is on torsemide along with spironolactone and the dose has been reduced.  Creatinine is stable today however patient remains oliguric.  Other concerns of note include hypotension and tachycardia.  At this time patient does not wish for pressors.  Patient will continue to be a poor candidate for long-term dialysis but is currently not stable enough intermittent dialysis inpatient.  This was discussed with patient today.  Patient unable to voice her desires for outcome of care palliative care team continues to follow patient.    #2: Anemia secondary to chronic kidney disease.  Hemoglobin at goal.  Continuing to monitor Lab Results  Component Value Date   HGB 9.4 (L) 08/09/2022     #3: Lung cancer/respiratory failure/pleural effusion:  Right pleural effusion.  Required right chest tube placement 08/07/2022.  Serosanguineous drainage recorded    Overall prognosis poor.  Continue discussions with palliative care and family considering comfort measures.   LOS: [facility] 10/26/202310:49 AM

## 2022-08-09 NOTE — Progress Notes (Signed)
Spoke with pt at length about fears regarding death and the dying process. Pt states that she is "worried about my family." When asked about knowledge ofthe process, pt indicated interest. This RN explained the process per discussions with Asencion Gowda, NP and the care team; reiterated information provided by Laurann Montana, NP and Leslye Peer, MD. Pt states that she wants to "turn everything off, and not be in pain." Called daughter, [name] on speaker phone so that the pt can relay her wishes. [name] in agreement with the plan as long as it is her mother's wish. Pt wanting to speak with other family members to ensure that they will be present when medications are discontinued. Informed the patient that the process occurs on her timeline. Pt requests that she be made comfort care tomorrow 08/10/22. Discussions relayed to Laurann Montana, NP and Leslye Peer, MD.

## 2022-08-09 NOTE — Progress Notes (Signed)
Nutrition Follow Up Note   DOCUMENTATION CODES:   Severe malnutrition in context of chronic illness  INTERVENTION:   If family wishes to pursue full aggressive care, recommend NGT placement and nutrition support.   Boost Breeze po TID, each supplement provides 250 kcal and 9 grams of protein  Magic cup TID with meals, each supplement provides 290 kcal and 9 grams of protein  MVI po daily   Pt at high refeed risk; recommend monitor potassium, magnesium and phosphorus labs daily until stable  NUTRITION DIAGNOSIS:   Severe Malnutrition related to chronic illness as evidenced by severe fat depletion, severe muscle depletion.  GOAL:   Patient will meet greater than or equal to 90% of their needs -not met   MONITOR:   PO intake, Supplement acceptance, Labs, Weight trends, Skin, I & O's  ASSESSMENT:   66 y.o. 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  Pt s/p chest tube placement 10/24  Pt with fairly good appetite and oral intake at the beginning of her admission. Pt was contemplating G-tube placement for esophageal dysmotility but underwent a calorie count on 10/11 and was meeting 80-90% of her estimated needs via oral intake. Pt has had a decline in her status since admission and has now progressed to having poor appetite and oral intake. Pt is refusing meals and only eats sips/bites sometimes. Pt has now been  without adequate nutrition for 7 days. Pt with worsening renal function and is a poor dialysis candidate. Palliative care is having ongoing discussions with family about [place]. If family wishes to pursue full aggressive treatment, recommend NGT placement and nutrition support. Pt is at high refeed risk. Per chart, pt is down ~5lbs from her UBW. Pt -6.6L on her I & Os. RD will monitor for GOC.   Medications reviewed and include: celexa, MVI, protonix, miralax, senokot  Labs reviewed: Na 134(L), K 4.4 wnl, BUN 91(H), creat 4.88(H), P 6.9(H), Mg 2.5(H) Hgb 9.4(L), Hct 28.7(L)  Chest tube- 784ml output   UOP- 311ml   Diet Order:   Diet Order             Diet Heart Room service appropriate? Yes; Fluid consistency: Thin  Diet effective now                  EDUCATION NEEDS:   Education needs have been addressed  Skin:  Skin Assessment: Reviewed RN Assessment (incision chest)  Last BM:  10/24- type 6  Height:   Ht Readings from Last 1 Encounters:  08/02/22 $RemoveB'5\' 6"'[id number]$  (1.676 m)    Weight:   Wt Readings from Last 1 Encounters:  08/09/22 48.7 kg    Ideal Body Weight:  59 kg  BMI:  Body mass index is 17.33 kg/m.  Estimated Nutritional Needs:   Kcal:  1400-1600kcal/day  Protein:  70-80g/day  Fluid:  1.3-1.5L/day  [name] MS, RD, LDN Please refer to [facility] for RD and/or RD on-call/weekend/after hours pager

## 2022-08-09 NOTE — Progress Notes (Signed)
Daily Progress Note   Patient Name: Danielle [name]       Date: 08/09/2022 DOB: 05-Jan-1956  Age: 66 y.o. MRN#: [id number] Attending Physician: Loletha Grayer, MD Primary Care Physician: [name], [name] Admit Date: 07/24/2022  Reason for Consultation/Follow-up: Establishing goals of care  Subjective: Notes and labs reviewed. In to see patient, she is currently on the phone. No family at bedside.  Spoke with nurse in detail. She states today, patient has discussed not wanting dialysis, and wanting to focus more on comfort. She states patient called her daughter on speaker phone and they discussed together during her presence. Primary RN tells me daughter is working to get all family members in to visit with patient as per patient request.   If patient is shifted to comfort care, anticipate hospital death.  Length of Stay: 22  Current Medications: Scheduled Meds:   amiodarone  200 mg Oral Daily   apixaban  2.5 mg Oral BID   atorvastatin  40 mg Oral Daily   Chlorhexidine Gluconate Cloth  6 each Topical Daily   citalopram  20 mg Oral Daily   feeding supplement  1 Container Oral TID BM   lidocaine (PF)  2 mL Intradermal Once   midodrine  10 mg Oral TID AC & HS   multivitamin with minerals  1 tablet Oral Daily   pantoprazole  40 mg Oral BID AC   polyethylene glycol  17 g Oral Daily   senna-docusate  2 tablet Oral BID   sodium chloride flush  10 mL Intrapleural Q8H   sodium chloride flush  3 mL Intravenous Q12H    Continuous Infusions:  sodium chloride 5 mL/hr at 08/09/22 1400   anticoagulant sodium citrate     milrinone Stopped (08/09/22 1356)    PRN Meds: sodium chloride, acetaminophen **OR** acetaminophen, albuterol, alteplase, anticoagulant sodium citrate, calcium  carbonate, guaiFENesin, heparin, HYDROcodone-acetaminophen, lidocaine (PF), lidocaine (PF), lidocaine-prilocaine, melatonin, morphine injection, pentafluoroprop-tetrafluoroeth, simethicone  Physical Exam Pulmonary:     Effort: Pulmonary effort is normal.  Neurological:     Mental Status: She is alert.             Vital Signs: BP 111/84   Pulse (!) 125   Temp 97.9 F (36.6 C) (Oral)   Resp 16   Ht 5\' 6"  (1.676 m)  Wt 48.7 kg   SpO2 (!) 87%   BMI 17.33 kg/m  SpO2: SpO2: (!) 87 % O2 Device: O2 Device: High Flow Nasal Cannula O2 Flow Rate: O2 Flow Rate (L/min): 50 L/min  Intake/output summary:  Intake/Output Summary (Last 24 hours) at 08/09/2022 1418 Last data filed at 08/09/2022 1400 Gross per 24 hour  Intake 338.36 ml  Output 970 ml  Net -631.64 ml   LBM: Last BM Date : 08/07/22 Baseline Weight: Weight: 50.3 kg Most recent weight: Weight: 48.7 kg    Patient Active Problem List   Diagnosis Date Noted   Cardiorenal syndrome with renal failure 08/05/2022   Moderate aortic stenosis 08/02/2022   Moderate tricuspid regurgitation by prior echocardiogram 08/02/2022   Prolonged QT interval 07/19/2022   Streptococcal pneumonia ([place]) 06/20/2022   Dyspnea 05/23/2022   SOB (shortness of breath) 05/22/2022   Chronic respiratory failure with hypoxia (HCC)    Pleural effusion on right 05/08/2022   Paroxysmal atrial fibrillation ([place]) 03/10/2022   Dyslipidemia 03/10/2022   COPD, severe ([place]) 03/10/2022   Atrial fibrillation with RVR ([place]) 02/22/2022   TIA (transient ischemic attack) 01/29/2022   Epistaxis 01/27/2022   Pleural effusion 01/26/2022   C. difficile colitis 11/27/2021   Abdominal pain 11/26/2021   HLD (hyperlipidemia) 11/26/2021   Iron deficiency anemia 11/26/2021   Aspiration pneumonia of right lower lobe due to gastric secretions (HCC)    Ileus following gastrointestinal surgery ([place])    Hypomagnesemia    Anemia of chronic disease    Hyperphosphatemia  10/24/2021   Hypotension 11/17/2020   Ileostomy status ([place]) 11/17/2020   Chronic obstructive pulmonary disease ([place]) 08/01/2020   Hypokalemia 01/20/2020   Elevated troponin 01/20/2020   Sepsis ([place]) 01/20/2020   Acute on chronic combined systolic and diastolic CHF (congestive heart failure) ([place]) 01/20/2020   Pleural effusion due to CHF (congestive heart failure) ([place]) 11/06/2019   Diarrhea    GERD (gastroesophageal reflux disease) 10/29/2019   Depression 10/29/2019   Acute blood loss anemia 06/20/2019   Normocytic anemia 06/20/2019   Anemia of chronic kidney failure, stage 4 (severe) ([place]) 06/12/2019   Malignant neoplasm of lower third of esophagus ([place]) 05/01/2019   Acute on chronic respiratory failure with hypoxia and hypercapnia ([place]) 12/22/2018   Ileostomy dysfunction ([place]) 09/28/2018   Reflux esophagitis    Iron deficiency anemia secondary to blood loss (chronic)    SBO (small bowel obstruction) ([place]) 06/29/2018   Atrial flutter ([place]) 06/09/2018   Lymphedema 06/09/2018   Acute kidney injury superimposed on CKD ([place]) 06/04/2018   Severe protein-calorie malnutrition ([place]) 06/04/2018   Focal (segmental) acute (reversible) ischemia of large intestine (HCC)    Ulceration of intestine    Diverticulosis of large intestine without diverticulitis    Colitis    Malnutrition of moderate degree 04/23/2018   Palliative care by specialist    DNR (do not resuscitate) discussion    Weakness    Acute on chronic respiratory failure with hypoxia ([place]) 04/21/2018   Hyponatremia 02/10/2017   Malignant neoplasm of upper lobe of right lung 07/30/2016   Lung nodule, solitary 07/25/2016   Malignant neoplasm of upper lobe of right lung ([place]) 07/25/2016   Carcinoma of overlapping sites of left breast in female, estrogen receptor positive ([place]) 06/22/2016   Multiple lung nodules 06/22/2016   Lesion of right lung    Breast cancer in female ([place]) 02/21/2016   Moderate COPD (chronic obstructive  pulmonary disease) ([place]) 08/24/2014    Palliative Care Assessment & Plan  Recommendations/Plan:  Per staff patient has been discussing not wanting dialysis, and leaning towards comfort care. Daughter is working on getting all family members to hospital that patient has requested.   If shifted to comfort care, anticipate hospital death.    Code Status:    Code Status Orders  (From admission, onward)           Start     Ordered   08/07/22 1343  Do not attempt resuscitation (DNR)  Continuous       Question Answer Comment  In the event of cardiac or respiratory ARREST Do not call a "code blue"   In the event of cardiac or respiratory ARREST Do not perform Intubation, CPR, defibrillation or ACLS   In the event of cardiac or respiratory ARREST Use medication by any route, position, wound care, and other measures to relive pain and suffering. May use oxygen, suction and manual treatment of airway obstruction as needed for comfort.      08/07/22 1342           Code Status History     Date Active Date Inactive Code Status Order ID Comments User Context   08/09/2022 1946 08/07/2022 1342 Full Code [id number]  Athena Masse, MD ED   06/17/2022 1013 06/21/2022 1951 Full Code [id number]  Clance Boll, MD ED   05/22/2022 1917 05/26/2022 2026 Full Code [id number]  Para Skeans, MD ED   05/08/2022 1108 05/12/2022 1654 Full Code [id number]  Collier Bullock, MD ED   03/10/2022 0151 03/11/2022 2340 Full Code [id number]  Mansy, Arvella Merles, MD ED   02/21/2022 2006 02/24/2022 1903 Full Code [id number]  Clance Boll, MD ED   01/26/2022 2006 02/04/2022 1500 Full Code [id number]  Clarnce Flock, MD Inpatient   11/26/2021 1623 11/29/2021 2154 Full Code [id number]  Ivor Costa, MD ED   10/23/2021 1800 11/24/2021 1619 Full Code [id number]  Herbert Pun, MD Inpatient   11/17/2020 0432 11/18/2020 1748 Full Code [id number]  Athena Masse, MD ED   11/05/2020 0604 11/10/2020 1631 Full Code [id number]  Benjamine Sprague, [place] [facility]   08/01/2020 1124 08/05/2020 2228 Full Code [id number]  Collier Bullock, MD ED   01/20/2020 0807 01/21/2020 2051 Full Code [id number]  Ivor Costa, MD ED   11/06/2019 1648 11/11/2019 1749 Full Code [id number]  Max Sane, MD ED   10/29/2019 1436 11/04/2019 1859 Full Code [id number]  Ivor Costa, MD ED   10/14/2019 0253 10/21/2019 1810 Full Code [id number]  Athena Masse, MD ED   09/27/2019 0211 10/02/2019 1736 Full Code [id number]  Athena Masse, MD ED   06/19/2019 1124 06/20/2019 1742 Full Code [id number]  Lang Snow, [place] [facility]   12/22/2018 0844 12/24/2018 1727 Full Code [id number]  Harrie Foreman Inpatient   09/28/2018 0813 10/04/2018 1522 Full Code [id number]  Herbert Pun, MD Inpatient   09/05/2018 0616 09/10/2018 1708 Full Code [id number]  Arta Silence, MD ED   08/12/2018 0540 08/16/2018 2057 Full Code [id number]  Arta Silence, MD ED   07/21/2018 0332 07/23/2018 1718 Full Code [id number]  Derrek Monaco, RN Inpatient   07/18/2018 0640 07/21/2018 0330 DNR [id number]  Harrie Foreman Inpatient   07/05/2018 0808 07/09/2018 1946 DNR [id number]  Harrie Foreman Inpatient   06/30/2018 1733 07/02/2018 1507 DNR [id number]  [name], MD Inpatient   06/30/2018 0019 06/30/2018 1733 Full Code [id number]  Amelia Jo, MD Inpatient   06/04/2018 0113 06/05/2018 1448 Full Code [id number]  Amelia Jo, MD  Inpatient   04/21/2018 0633 05/17/2018 2214 Full Code [id number]  Arta Silence, MD Inpatient   02/09/2017 1125 02/11/2017 1706 Full Code [id number]  Henreitta Leber, MD Inpatient       Prognosis:  Hours - Days   Care plan was discussed with RN  Thank you for allowing the Palliative Medicine Team to assist in the care of this patient.    Asencion Gowda, NP  Please contact Palliative Medicine Team phone at [phone] for questions and concerns.

## 2022-08-10 DIAGNOSIS — Z7189 Other specified counseling: Secondary | ICD-10-CM | POA: Diagnosis not present

## 2022-08-10 DIAGNOSIS — R570 Cardiogenic shock: Secondary | ICD-10-CM | POA: Diagnosis not present

## 2022-08-10 DIAGNOSIS — J90 Pleural effusion, not elsewhere classified: Secondary | ICD-10-CM | POA: Diagnosis not present

## 2022-08-10 DIAGNOSIS — I1310 Hypertensive heart and chronic kidney disease without heart failure, with stage 1 through stage 4 chronic kidney disease, or unspecified chronic kidney disease: Secondary | ICD-10-CM | POA: Diagnosis not present

## 2022-08-10 DIAGNOSIS — I5043 Acute on chronic combined systolic (congestive) and diastolic (congestive) heart failure: Secondary | ICD-10-CM | POA: Diagnosis not present

## 2022-08-10 LAB — BASIC METABOLIC PANEL
Anion gap: 12 (ref 5–15)
BUN: 95 mg/dL — ABNORMAL HIGH (ref 8–23)
CO2: 22 mmol/L (ref 22–32)
Calcium: 9.2 mg/dL (ref 8.9–10.3)
Chloride: 98 mmol/L (ref 98–111)
Creatinine, Ser: 4.77 mg/dL — ABNORMAL HIGH (ref 0.44–1.00)
GFR, Estimated: 9 mL/min — ABNORMAL LOW (ref >60)
Glucose, Bld: 89 mg/dL (ref 70–99)
Potassium: 4.7 mmol/L (ref 3.5–5.1)
Sodium: 132 mmol/L — ABNORMAL LOW (ref 135–145)

## 2022-08-10 LAB — CHOLESTEROL, BODY FLUID: Cholesterol, Fluid: 35 mg/dL

## 2022-08-10 LAB — CYTOLOGY - NON PAP

## 2022-08-10 MED ORDER — TORSEMIDE 20 MG PO TABS
20.0000 mg | ORAL_TABLET | Freq: Every day | ORAL | Status: DC
  Administered 2022-08-10 – 2022-08-24 (×15): 20 mg via ORAL
  Filled 2022-08-10 (×15): qty 1

## 2022-08-10 NOTE — Progress Notes (Addendum)
Daily Progress Note   Patient Name: Danielle [name]       Date: 08/10/2022 DOB: July 18, 1956  Age: 66 y.o. MRN#: [id number] Attending Physician: Loletha Grayer, MD Primary Care Physician: [name], [name] Admit Date: 07/28/2022  Reason for Consultation/Follow-up: Establishing goals of care  Subjective: Notes and labs reviewed. In to see patient. Daughter in to bedside. Patient states she would like to continue life prolonging care. She states she does not want dialysis even if a candidate, but does want other life prolonging care. Comfort care has been discussed in great detail. Other family in to bedside. Discussed previous conversations and discussions as well as current treatment.   Length of Stay: 23  Current Medications: Scheduled Meds:   amiodarone  200 mg Oral Daily   apixaban  2.5 mg Oral BID   atorvastatin  40 mg Oral Daily   Chlorhexidine Gluconate Cloth  6 each Topical Daily   citalopram  20 mg Oral Daily   feeding supplement  1 Container Oral TID BM   lidocaine (PF)  2 mL Intradermal Once   midodrine  10 mg Oral TID AC & HS   multivitamin with minerals  1 tablet Oral Daily   pantoprazole  40 mg Oral BID AC   polyethylene glycol  17 g Oral Daily   senna-docusate  2 tablet Oral BID   sodium chloride flush  10 mL Intrapleural Q8H   sodium chloride flush  3 mL Intravenous Q12H   torsemide  20 mg Oral Daily    Continuous Infusions:  sodium chloride 5 mL/hr at 08/10/22 1000   anticoagulant sodium citrate     milrinone 0.25 mcg/kg/min (08/10/22 1000)    PRN Meds: sodium chloride, acetaminophen **OR** acetaminophen, albuterol, alteplase, anticoagulant sodium citrate, calcium carbonate, guaiFENesin, heparin, HYDROcodone-acetaminophen, lidocaine (PF), lidocaine (PF),  lidocaine-prilocaine, melatonin, morphine injection, pentafluoroprop-tetrafluoroeth, simethicone  Physical Exam Pulmonary:     Effort: Pulmonary effort is normal.  Neurological:     Mental Status: She is alert.             Vital Signs: BP (!) 82/66   Pulse (!) 132   Temp 97.8 F (36.6 C) (Oral)   Resp 19   Ht 5\' 6"  (1.676 m)   Wt 49.4 kg   SpO2 92%   BMI 17.58 kg/m  SpO2:  SpO2: 92 % O2 Device: O2 Device: High Flow Nasal Cannula O2 Flow Rate: O2 Flow Rate (L/min): (S) 50 L/min  Intake/output summary:  Intake/Output Summary (Last 24 hours) at 08/10/2022 1559 Last data filed at 08/10/2022 1500 Gross per 24 hour  Intake 172.99 ml  Output 1730 ml  Net -1557.01 ml   LBM: Last BM Date : 08/10/22 Baseline Weight: Weight: 50.3 kg Most recent weight: Weight: 49.4 kg         Patient Active Problem List   Diagnosis Date Noted   Cardiogenic shock ([place]) 08/10/2022   Cardiorenal syndrome with renal failure 08/05/2022   Moderate aortic stenosis 08/02/2022   Moderate tricuspid regurgitation by prior echocardiogram 08/02/2022   Prolonged QT interval 07/19/2022   Streptococcal pneumonia ([place]) 06/20/2022   Dyspnea 05/23/2022   SOB (shortness of breath) 05/22/2022   Chronic respiratory failure with hypoxia (HCC)    Pleural effusion on right 05/08/2022   Paroxysmal atrial fibrillation ([place]) 03/10/2022   Dyslipidemia 03/10/2022   COPD, severe ([place]) 03/10/2022   Atrial fibrillation with RVR ([place]) 02/22/2022   TIA (transient ischemic attack) 01/29/2022   Epistaxis 01/27/2022   Pleural effusion 01/26/2022   C. difficile colitis 11/27/2021   Abdominal pain 11/26/2021   HLD (hyperlipidemia) 11/26/2021   Iron deficiency anemia 11/26/2021   Aspiration pneumonia of right lower lobe due to gastric secretions (HCC)    Ileus following gastrointestinal surgery ([place])    Hypomagnesemia    Anemia of chronic disease    Hyperphosphatemia 10/24/2021   Ileostomy status ([place]) 11/17/2020    Chronic obstructive pulmonary disease ([place]) 08/01/2020   Hypokalemia 01/20/2020   Elevated troponin 01/20/2020   Sepsis ([place]) 01/20/2020   Acute on chronic combined systolic and diastolic CHF (congestive heart failure) ([place]) 01/20/2020   Pleural effusion due to CHF (congestive heart failure) ([place]) 11/06/2019   Diarrhea    GERD (gastroesophageal reflux disease) 10/29/2019   Depression 10/29/2019   Acute blood loss anemia 06/20/2019   Normocytic anemia 06/20/2019   Anemia of chronic kidney failure, stage 4 (severe) ([place]) 06/12/2019   Malignant neoplasm of lower third of esophagus ([place]) 05/01/2019   Acute on chronic respiratory failure with hypoxia and hypercapnia ([place]) 12/22/2018   Ileostomy dysfunction ([place]) 09/28/2018   Reflux esophagitis    Iron deficiency anemia secondary to blood loss (chronic)    SBO (small bowel obstruction) ([place]) 06/29/2018   Atrial flutter ([place]) 06/09/2018   Lymphedema 06/09/2018   Acute kidney injury superimposed on CKD ([place]) 06/04/2018   Severe protein-calorie malnutrition ([place]) 06/04/2018   Focal (segmental) acute (reversible) ischemia of large intestine (HCC)    Ulceration of intestine    Diverticulosis of large intestine without diverticulitis    Colitis    Malnutrition of moderate degree 04/23/2018   Palliative care by specialist    DNR (do not resuscitate) discussion    Weakness    Acute on chronic respiratory failure with hypoxia ([place]) 04/21/2018   Hyponatremia 02/10/2017   Malignant neoplasm of upper lobe of right lung 07/30/2016   Lung nodule, solitary 07/25/2016   Malignant neoplasm of upper lobe of right lung ([place]) 07/25/2016   Carcinoma of overlapping sites of left breast in female, estrogen receptor positive ([place]) 06/22/2016   Multiple lung nodules 06/22/2016   Lesion of right lung    Breast cancer in female ([place]) 02/21/2016   Moderate COPD (chronic obstructive pulmonary disease) ([place]) 08/24/2014    Palliative Care Assessment & Plan     Recommendations/Plan: Continue current care.  Code Status:    Code Status Orders  (From admission, onward)           Start     Ordered   08/07/22 1343  Do not attempt resuscitation (DNR)  Continuous       Question Answer Comment  In the event of cardiac or respiratory ARREST Do not call a "code blue"   In the event of cardiac or respiratory ARREST Do not perform Intubation, CPR, defibrillation or ACLS   In the event of cardiac or respiratory ARREST Use medication by any route, position, wound care, and other measures to relive pain and suffering. May use oxygen, suction and manual treatment of airway obstruction as needed for comfort.      08/07/22 1342           Code Status History     Date Active Date Inactive Code Status Order ID Comments User Context   08/03/2022 1946 08/07/2022 1342 Full Code [id number]  Athena Masse, MD ED   06/17/2022 1013 06/21/2022 1951 Full Code [id number]  Clance Boll, MD ED   05/22/2022 1917 05/26/2022 2026 Full Code [id number]  Para Skeans, MD ED   05/08/2022 1108 05/12/2022 1654 Full Code [id number]  Collier Bullock, MD ED   03/10/2022 0151 03/11/2022 2340 Full Code [id number]  Mansy, Arvella Merles, MD ED   02/21/2022 2006 02/24/2022 1903 Full Code [id number]  Clance Boll, MD ED   01/26/2022 2006 02/04/2022 1500 Full Code [id number]  Clarnce Flock, MD Inpatient   11/26/2021 1623 11/29/2021 2154 Full Code [id number]  Ivor Costa, MD ED   10/23/2021 1800 11/24/2021 1619 Full Code [id number]  Herbert Pun, MD Inpatient   11/17/2020 0432 11/18/2020 1748 Full Code [id number]  Athena Masse, MD ED   11/05/2020 0604 11/10/2020 1631 Full Code [id number]  Benjamine Sprague, [place] ED   08/01/2020 1124 08/05/2020 2228 Full Code [id number]  Collier Bullock, MD ED   01/20/2020 0807 01/21/2020 2051 Full Code [id number]  Ivor Costa, MD ED   11/06/2019 1648 11/11/2019 1749 Full Code [id number]  Max Sane, MD ED   10/29/2019 1436 11/04/2019 1859 Full Code [id number]  Ivor Costa, MD ED   10/14/2019 0253 10/21/2019 1810 Full Code [id number]  Athena Masse, MD ED   09/27/2019 0211 10/02/2019 1736 Full Code [id number]  Athena Masse, MD ED   06/19/2019 1124 06/20/2019 1742 Full Code [id number]  Lang Snow, NP ED   12/22/2018 0844 12/24/2018 1727 Full Code [id number]  Harrie Foreman Inpatient   09/28/2018 0813 10/04/2018 1522 Full Code [id number]  Herbert Pun, MD Inpatient   09/05/2018 0616 09/10/2018 1708 Full Code [id number]  Arta Silence, MD ED   08/12/2018 0540 08/16/2018 2057 Full Code [id number]  Arta Silence, MD ED   07/21/2018 0332 07/23/2018 1718 Full Code [id number]  Derrek Monaco, RN Inpatient   07/18/2018 0640 07/21/2018 0330 DNR [id number]  Harrie Foreman Inpatient   07/05/2018 0808 07/09/2018 1946 DNR [id number]  Harrie Foreman Inpatient   06/30/2018 1733 07/02/2018 1507 DNR [id number]  [name], MD Inpatient   06/30/2018 0019 06/30/2018 1733 Full Code [id number]  Amelia Jo, MD Inpatient   06/04/2018 0113 06/05/2018 1448 Full Code [id number]  Amelia Jo, MD Inpatient   04/21/2018 0633 05/17/2018 2214 Full Code [id number]  Arta Silence, MD Inpatient   02/09/2017 1125 02/11/2017 1706 Full Code [id number]  Henreitta Leber, MD Inpatient       Prognosis:  Anticipate hospital death.  Care plan was discussed with RN and attending.   Thank you for allowing the Palliative Medicine Team to assist in the care of this patient.    Asencion Gowda, NP  Please contact Palliative Medicine Team phone at [phone] for questions and concerns.

## 2022-08-10 NOTE — Assessment & Plan Note (Addendum)
Last creatinine 5.91

## 2022-08-10 NOTE — Progress Notes (Addendum)
[facility]  PROGRESS NOTE   Subjective:   Patient seen resting quietly, no family at bedside Alert and oriented Breakfast on bedside table, will attempt Milrinone in place, remains hypotensive and tachycardic.  Remains on HFNC No lower extremity edema  Creatinine -4.77 oliguric, 200 mL reocrded  Objective:  Vital signs: Blood pressure 100/65, pulse (!) 130, temperature 97.7 F (36.5 C), temperature source Axillary, resp. rate (!) 22, height 5\' 6"  (1.676 m), weight 49.4 kg, SpO2 100 %.  Intake/Output Summary (Last 24 hours) at 08/10/2022 1110 Last data filed at 08/10/2022 1000 Gross per 24 hour  Intake 209.1 ml  Output 1610 ml  Net -1400.9 ml    Filed Weights   08/08/22 0500 08/09/22 0500 08/10/22 0533  Weight: 49.1 kg 48.7 kg 49.4 kg     Physical Exam: General:  No acute distress, ill-appearing  Head:  Normocephalic, atraumatic. Moist oral mucosal membranes  Eyes:  Anicteric  Lungs:  Bilateral wheeze.  Also has crackles bilaterally.  High flow nasal cannula   Heart:  S1S2 no rubs, irregular, tachycardic, A-fib  Abdomen:   Soft, nontender, bowel sounds present  Extremities: No dependent peripheral edema.  Neurologic:  Awake, alert, following commands  Skin:  No lesions  Access: None    Basic Metabolic Panel: Recent Labs  Lab 08/05/22 0326 08/06/22 0431 08/07/22 0601 08/08/22 0350 08/09/22 0431 08/10/22 0747  NA 136 135 135 134* 134* 132*  K 4.7 5.1 3.8 4.4 4.4 4.7  CL 97* 97* 100 99 100 98  CO2 28 27 23 24 23 22   GLUCOSE 96 106* 103* 105* 105* 89  BUN 83* 92* 89* 93* 91* 95*  CREATININE 3.38* 4.21* 4.45* 4.95* 4.88* 4.77*  CALCIUM 8.9 8.9 8.9 8.9 8.8* 9.2  MG 2.2  --   --  2.3 2.5*  --   PHOS  --   --   --  5.9* 6.0*  --      CBC: Recent Labs  Lab 08/05/22 0326 08/08/22 0350 08/09/22 0431  WBC 6.9 5.7 7.2  HGB 9.8* 8.8* 9.4*  HCT 30.1* 26.9* 28.7*  MCV 96.2 95.4 95.0  PLT 239 214 236      Urinalysis: No results for  input(s): "COLORURINE", "LABSPEC", "PHURINE", "GLUCOSEU", "HGBUR", "BILIRUBINUR", "KETONESUR", "PROTEINUR", "UROBILINOGEN", "NITRITE", "LEUKOCYTESUR" in the last 72 hours.  Invalid input(s): "APPERANCEUR"    Imaging: No results found.   Medications:    sodium chloride 5 mL/hr at 08/10/22 1000   anticoagulant sodium citrate     milrinone 0.25 mcg/kg/min (08/10/22 1000)    amiodarone  200 mg Oral Daily   apixaban  2.5 mg Oral BID   atorvastatin  40 mg Oral Daily   Chlorhexidine Gluconate Cloth  6 each Topical Daily   citalopram  20 mg Oral Daily   feeding supplement  1 Container Oral TID BM   lidocaine (PF)  2 mL Intradermal Once   midodrine  10 mg Oral TID AC & HS   multivitamin with minerals  1 tablet Oral Daily   pantoprazole  40 mg Oral BID AC   polyethylene glycol  17 g Oral Daily   senna-docusate  2 tablet Oral BID   sodium chloride flush  10 mL Intrapleural Q8H   sodium chloride flush  3 mL Intravenous Q12H   torsemide  20 mg Oral Daily    Assessment/ Plan:     Principal Problem:   Acute on chronic combined systolic and diastolic CHF (congestive heart failure) (HCC) Active  Problems:   Carcinoma of overlapping sites of left breast in female, estrogen receptor positive ([place])   Acute on chronic respiratory failure with hypoxia (HCC)   Acute kidney injury superimposed on CKD (HCC)   Severe protein-calorie malnutrition (HCC)   Depression   Chronic obstructive pulmonary disease (HCC)   Hypotension   Hypomagnesemia   Anemia of chronic disease   Abdominal pain   HLD (hyperlipidemia)   Pleural effusion   Paroxysmal atrial fibrillation (HCC)   Pleural effusion on right   Malignant neoplasm of upper lobe of right lung   Prolonged QT interval   Moderate aortic stenosis   Moderate tricuspid regurgitation by prior echocardiogram   Cardiorenal syndrome with renal failure  Danielle [name] is a 66 y.o.  female with past medical conditions including diabetes,  hypertension, lymphedema, breast cancer COPD and chronic kidney disease.  Patient presents to the emergency department of shortness of breath and has been admitted for Pleural effusion [J90] Status post thoracentesis [Z98.890] CHF exacerbation ([place]) [I50.9] Pleural effusion on right [J90] Acute on chronic combined systolic and diastolic CHF (congestive heart failure) (HCC) [I50.43] Acute on chronic respiratory failure with hypoxia ([place]) [J96.21]   #1: Acute kidney injury on chronic kidney disease stage IIIb.  Baseline creatinine 1.50/GFR 38 from 06/16/2022.: Patient has worsening renal insufficiency secondary to possible cardiorenal syndrome.  She was on IV furosemide drip which was discontinued.  Presently she is on torsemide along with spironolactone and the dose has been reduced.  Renal function remains stable, oliguria persist. Remains too unstable to receive hemodialysis. According to palliative care note, patient has now decided not to pursue dialysis. Patient and family actively considering comfort measures. Will continue to monitor.  #2: Anemia secondary to chronic kidney disease.  Hemoglobin remains stable Lab Results  Component Value Date   HGB 9.4 (L) 08/09/2022     #3: Lung cancer/respiratory failure/pleural effusion:  Right pleural effusion.  Required right chest tube placement 08/07/2022.  Serosanguineous drainage recorded    Overall prognosis poor. Considering comfort measures.    LOS: [facility] 10/27/202311:10 AM

## 2022-08-10 NOTE — Plan of Care (Signed)
Pts o2 decreased and pt tolerating at this time. O2 sats decrease with any exertion but quickly return without additional supplemental o2. Pt with 481ml of urine throughout shift. Pt states she wishes to continue plan of care and declined comfort care at this time. Will continue to monitor.

## 2022-08-10 NOTE — Assessment & Plan Note (Addendum)
Discontinued midodrine.

## 2022-08-10 NOTE — Progress Notes (Signed)
Nutrition Brief Note  Chart reviewed. Pt now transitioning to comfort care.  No further nutrition interventions planned at this time.  Please re-consult as needed.   Shadawn Hanaway W, RD, LDN, CDCES Registered Dietitian II Certified Diabetes Care and Education Specialist Please refer to [facility] for RD and/or RD on-call/weekend/after hours pager   

## 2022-08-10 NOTE — Progress Notes (Signed)
Progress Note   Patient: Danielle [name] [id number] DOB: [birth year] DOA: 08/08/2022     23 DOS: the patient was seen and examined on 08/10/2022   Brief hospital course: Danielle [name] is a 66 y.o. female with medical history significant for Paroxysmal A-fib on chronic anticoagulation therapy, history of chronic systolic heart failure, history of adenocarcinoma of the right lung status post radiation therapy, history of stage IIIb chronic kidney disease, aortic valve stenosis, COPD with chronic respiratory failure on 4 L of oxygen continuous, history of left breast cancer status post mastectomy, history of esophageal cancer recurrent right-sided pleural effusion requiring thoracentesis, most recently on 06/19/2022 who presented to the ER on 08/04/2022 for evaluation of worsening shortness of breath with associated chest tightness.    ED course and evaluation --- BP 135/99 with RR 29 with O2 sat 90% on increased O2 flow rate of 5 L/min.  Labs with troponin 15>>17, BNP 2935.  Treated with IV Lasix in the ED.  Chest x-ray showed -- Small right pleural effusion, increased since prior study.  Increasing right basilar consolidation, which may reflect airspace disease or progressive atelectasis.  Mass-like linear consolidation right suprahilar region, unchanged since recent CT.  Patient was admitted to hospitalist service and IV diuresis was continued.    IR was consulted for thoracentesis which was done on 10/5 with 900 cc fluid removed.   Hospital course complicated by: --Acute kidney injury after diuresis due to cardiorenal physiology.  Nephrology was consulted.   Renal function improved with diuretics held, but pt with persistent and worsening exertional dyspnea again. --Abdominal pain for which evaluation including abdominal xray and CT abdomen/pelvis was unremarkable. --Swallowing difficulties with severe esophageal dysmotility discovered on barium esophagram.    10/19.  Patient developed  worsening hypoxemia, currently on high flow oxygen.  We will place a Pleurx catheter as patient pleural effusion has reaccumulated, planned on Monday. Also restarted Lasix drip with milrinone after cardiology consult. 10/20.  Lasix discontinued as patient renal function is getting worse. 10/24.  Paracentesis performed, hemodialysis is planned.   10/25.  Called with blood pressure in the 60s.  Fluid bolus given.  Lasix and spironolactone discontinued.  We will start midodrine.  Yesterday with palliative care they did not want pressors.  Called patient's daughter with multiorgan failure and increasing oxygen requirements.  Case discussed with palliative care.  10/26 through 10/27 patient still has cardiogenic shock, still on heated high flow nasal cannula, still with acute kidney injury.   Assessment and Plan: * Acute on chronic combined systolic and diastolic CHF (congestive heart failure) (HCC) Echo 01/30/22 -- EF 35-40%, grade II diastolic dysfunction, moderate MR, mod-severe TR, moderate AS. Patient currently on milrinone drip.  Overall prognosis is poor with cardiogenic shock. Try to add back low-dose torsemide today   Cardiogenic shock (HCC) Continue milrinone drip.  Continue midodrine.  Acute on chronic respiratory failure with hypoxia (HCC) Baseline on 4 L/min continuous O2. This morning she was on heated high flow nasal cannula 56% oxygen and 45 L flow  Pleural effusion on right  Chart reviewed: prior thoracenteses done this year on 1/30, 2/8, 4/16, 5/12, 7/25, 8/8, 9/5 --Underwent thoracentesis on 10/5 with 900 cc fluid removed. --Currently has a right-sided chest tube.  Cardiorenal syndrome with renal failure Nephrology following.  Today's creatinine 4.77.  Paroxysmal atrial fibrillation (HCC) --Continue amiodarone and Elqius.  Unable to tolerate beta-blocker secondary to hypotension.  Acute kidney injury superimposed on CKD ([place]) Acute kidney injury on CKD stage  IIIb.   Nephrology following.  Today's creatinine 4.77.  Severe protein-calorie malnutrition ([place]) Encouraged to eat.  Patient refused NG tube.    Prolonged QT interval Continue to monitor on telemetry.  Malignant neoplasm of upper lobe of right lung Malignant neoplasm of lower third of esophagus Patient is status post radiation therapy for esophageal cancer S/p radiation therapy for lung cancer   HLD (hyperlipidemia) On Lipitor  Abdominal pain Continue to monitor.  Anemia of chronic disease Last hemoglobin 9.4  Hypomagnesemia Replaced  Chronic obstructive pulmonary disease (HCC) Severe COPD with chronic respiratory failure on 4 L of continuous O2 at home.   Depression Continue Celexa 20 mg daily  Carcinoma of overlapping sites of left breast in female, estrogen receptor positive ([place]) Noted. No acute issues.        Subjective: Patient feels okay.  Tells me this morning when she did not want to do comfort care measures.  Has some shortness of breath.  States she is eating a little bit better.  Physical Exam: Vitals:   08/10/22 1200 08/10/22 1300 08/10/22 1400 08/10/22 1500  BP: 95/70 (!) 88/76 98/69 (!) 82/66  Pulse: (!) 127 (!) 134 (!) 129 (!) 132  Resp: 20 (!) 23 18 19   Temp: 97.8 F (36.6 C)     TempSrc: Oral     SpO2: 91% (!) 89% 90% 92%  Weight:      Height:       Physical Exam HENT:     Head: Normocephalic.     Mouth/Throat:     Pharynx: No oropharyngeal exudate.  Eyes:     General: Lids are normal.     Conjunctiva/sclera: Conjunctivae normal.  Cardiovascular:     Rate and Rhythm: Regular rhythm. Tachycardia present.     Heart sounds: Normal heart sounds, S1 normal and S2 normal.  Pulmonary:     Breath sounds: Examination of the right-lower field reveals decreased breath sounds and rhonchi. Examination of the left-lower field reveals decreased breath sounds. Decreased breath sounds and rhonchi present. No wheezing or rales.  Abdominal:      Palpations: Abdomen is soft.     Tenderness: There is generalized abdominal tenderness.  Musculoskeletal:     Right lower leg: Swelling present.     Left lower leg: Swelling present.  Skin:    General: Skin is warm.     Findings: No rash.  Neurological:     Mental Status: She is alert and oriented to person, place, and time.     Data Reviewed: Creatinine 4.77, sodium 132  Family Communication: Spoke with patient's daughter on the phone  Disposition: Status is: Inpatient Remains inpatient appropriate because: Still on milrinone drip  Planned Discharge Destination: To be determined    Time spent: 28 minutes  Author: Loletha Grayer, MD 08/10/2022 3:37 PM  For on call review [email or web address].

## 2022-08-11 DIAGNOSIS — J9621 Acute and chronic respiratory failure with hypoxia: Secondary | ICD-10-CM | POA: Diagnosis not present

## 2022-08-11 DIAGNOSIS — I1310 Hypertensive heart and chronic kidney disease without heart failure, with stage 1 through stage 4 chronic kidney disease, or unspecified chronic kidney disease: Secondary | ICD-10-CM | POA: Diagnosis not present

## 2022-08-11 DIAGNOSIS — I5043 Acute on chronic combined systolic (congestive) and diastolic (congestive) heart failure: Secondary | ICD-10-CM | POA: Diagnosis not present

## 2022-08-11 DIAGNOSIS — R570 Cardiogenic shock: Secondary | ICD-10-CM | POA: Diagnosis not present

## 2022-08-11 LAB — BODY FLUID CULTURE W GRAM STAIN
Culture: NO GROWTH
Gram Stain: NONE SEEN

## 2022-08-11 NOTE — Plan of Care (Signed)
  Problem: Education: Goal: Ability to demonstrate management of disease process will improve Outcome: Progressing Goal: Ability to verbalize understanding of medication therapies will improve Outcome: Progressing   Problem: Activity: Goal: Capacity to carry out activities will improve Outcome: Not Progressing   Problem: Cardiac: Goal: Ability to achieve and maintain adequate cardiopulmonary perfusion will improve Outcome: Not Progressing   Problem: Education: Goal: Knowledge of General Education information will improve Description: Including pain rating scale, medication(s)/side effects and non-pharmacologic comfort measures Outcome: Progressing   Problem: Health Behavior/Discharge Planning: Goal: Ability to manage health-related needs will improve Outcome: Progressing   Problem: Clinical Measurements: Goal: Ability to maintain clinical measurements within normal limits will improve Outcome: Not Progressing Goal: Will remain free from infection Outcome: Progressing Goal: Diagnostic test results will improve Outcome: Not Progressing Goal: Respiratory complications will improve Outcome: Not Progressing Goal: Cardiovascular complication will be avoided Outcome: Not Progressing   Problem: Activity: Goal: Risk for activity intolerance will decrease Outcome: Not Progressing   Problem: Nutrition: Goal: Adequate nutrition will be maintained Outcome: Not Progressing   Problem: Coping: Goal: Level of anxiety will decrease 08/11/2022 1456 by Noreene Larsson, RN Outcome: Progressing 08/11/2022 1455 by Noreene Larsson, RN Outcome: Progressing   Problem: Elimination: Goal: Will not experience complications related to bowel motility Outcome: Progressing Goal: Will not experience complications related to urinary retention Outcome: Progressing   Problem: Pain Managment: Goal: General experience of comfort will improve Outcome: Progressing   Problem: Safety: Goal: Ability  to remain free from injury will improve Outcome: Progressing   Problem: Skin Integrity: Goal: Risk for impaired skin integrity will decrease Outcome: Progressing

## 2022-08-11 NOTE — Progress Notes (Signed)
[facility]  PROGRESS NOTE   Subjective:   Patient seen resting quietly, no family at bedside Trying to eat breakfast this morning Remains on high flow nasal cannula oxygen supplementation.    Objective:  Vital signs: Blood pressure 97/72, pulse (!) 113, temperature 98 F (36.7 C), temperature source Oral, resp. rate 14, height 5\' 6"  (1.676 m), weight 49.4 kg, SpO2 91 %.  Intake/Output Summary (Last 24 hours) at 08/11/2022 0906 Last data filed at 08/11/2022 0830 Gross per 24 hour  Intake 339.12 ml  Output 1052 ml  Net -712.88 ml    Filed Weights   08/09/22 0500 08/10/22 0533 08/11/22 0500  Weight: 48.7 kg 49.4 kg 49.4 kg     Physical Exam: General:  No acute distress, ill-appearing  Head:  Normocephalic, atraumatic. Moist oral mucosal membranes  Eyes:  Anicteric  Lungs:  mild crackles bilaterally.  High flow nasal cannula , chest tube in place  Heart:  S1S2 no rubs, irregular, tachycardic, A-fib  Abdomen:   Soft, nontender, bowel sounds present  Extremities: No dependent peripheral edema.  Neurologic:  Awake, alert, following commands  Skin:  No lesions  Access: None    Basic Metabolic Panel: Recent Labs  Lab 08/05/22 0326 08/06/22 0431 08/07/22 0601 08/08/22 0350 08/09/22 0431 08/10/22 0747  NA 136 135 135 134* 134* 132*  K 4.7 5.1 3.8 4.4 4.4 4.7  CL 97* 97* 100 99 100 98  CO2 28 27 23 24 23 22   GLUCOSE 96 106* 103* 105* 105* 89  BUN 83* 92* 89* 93* 91* 95*  CREATININE 3.38* 4.21* 4.45* 4.95* 4.88* 4.77*  CALCIUM 8.9 8.9 8.9 8.9 8.8* 9.2  MG 2.2  --   --  2.3 2.5*  --   PHOS  --   --   --  5.9* 6.0*  --      CBC: Recent Labs  Lab 08/05/22 0326 08/08/22 0350 08/09/22 0431  WBC 6.9 5.7 7.2  HGB 9.8* 8.8* 9.4*  HCT 30.1* 26.9* 28.7*  MCV 96.2 95.4 95.0  PLT 239 214 236      Urinalysis: No results for input(s): "COLORURINE", "LABSPEC", "PHURINE", "GLUCOSEU", "HGBUR", "BILIRUBINUR", "KETONESUR", "PROTEINUR", "UROBILINOGEN",  "NITRITE", "LEUKOCYTESUR" in the last 72 hours.  Invalid input(s): "APPERANCEUR"    Imaging: No results found.   Medications:    sodium chloride Stopped (08/10/22 2209)   anticoagulant sodium citrate     milrinone 0.25 mcg/kg/min (08/11/22 0830)    amiodarone  200 mg Oral Daily   apixaban  2.5 mg Oral BID   atorvastatin  40 mg Oral Daily   Chlorhexidine Gluconate Cloth  6 each Topical Daily   citalopram  20 mg Oral Daily   feeding supplement  1 Container Oral TID BM   midodrine  10 mg Oral TID AC & HS   multivitamin with minerals  1 tablet Oral Daily   pantoprazole  40 mg Oral BID AC   polyethylene glycol  17 g Oral Daily   senna-docusate  2 tablet Oral BID   sodium chloride flush  10 mL Intrapleural Q8H   sodium chloride flush  3 mL Intravenous Q12H   torsemide  20 mg Oral Daily    Assessment/ Plan:     Principal Problem:   Acute on chronic combined systolic and diastolic CHF (congestive heart failure) (HCC) Active Problems:   Carcinoma of overlapping sites of left breast in female, estrogen receptor positive ([place])   Acute on chronic respiratory failure with hypoxia ([place])  Acute kidney injury superimposed on CKD (HCC)   Severe protein-calorie malnutrition (HCC)   Depression   Chronic obstructive pulmonary disease (HCC)   Hypomagnesemia   Anemia of chronic disease   Abdominal pain   HLD (hyperlipidemia)   Pleural effusion   Paroxysmal atrial fibrillation (HCC)   Pleural effusion on right   Malignant neoplasm of upper lobe of right lung   Prolonged QT interval   Moderate aortic stenosis   Moderate tricuspid regurgitation by prior echocardiogram   Cardiorenal syndrome with renal failure   Cardiogenic shock [facility])  [name] is a 66 y.o.  female with past medical conditions including diabetes, hypertension, lymphedema, breast cancer COPD and chronic kidney disease.  Patient presents to the emergency department of shortness of breath and has been admitted  for Pleural effusion [J90] Status post thoracentesis [Z98.890] CHF exacerbation ([place]) [I50.9] Pleural effusion on right [J90] Acute on chronic combined systolic and diastolic CHF (congestive heart failure) (HCC) [I50.43] Acute on chronic respiratory failure with hypoxia ([place]) [J96.21]   #1: Acute kidney injury on chronic kidney disease stage IIIb.  Baseline creatinine 1.50/GFR 38 from 06/16/2022.: Patient has worsening renal insufficiency secondary to possible cardiorenal syndrome.  She was on IV furosemide drip which was discontinued.  Presently she is on torsemide. Renal function remains stable, oliguria persist. Remains too unstable to receive intermittent hemodialysis. According to palliative care note, patient has now decided not to pursue dialysis.  Nephrology team will sign off.  Please reconsult as necessary.  #2: Anemia secondary to chronic kidney disease.  Hemoglobin remains stable Lab Results  Component Value Date   HGB 9.4 (L) 08/09/2022     #3: Lung cancer/respiratory failure/pleural effusion:  Right pleural effusion.  Required right chest tube placement 08/07/2022.  Serosanguineous drainage recorded    Overall prognosis poor.     LOS: [facility] 10/28/20239:06 AM

## 2022-08-11 NOTE — Progress Notes (Signed)
Progress Note   Patient: Danielle [name] YCX:[id number] DOB: [birth year] DOA: 07/28/2022     24 DOS: the patient was seen and examined on 08/11/2022   Brief hospital course: Danielle [name] is a 66 y.o. female with medical history significant for Paroxysmal A-fib on chronic anticoagulation therapy, history of chronic systolic heart failure, history of adenocarcinoma of the right lung status post radiation therapy, history of stage IIIb chronic kidney disease, aortic valve stenosis, COPD with chronic respiratory failure on 4 L of oxygen continuous, history of left breast cancer status post mastectomy, history of esophageal cancer recurrent right-sided pleural effusion requiring thoracentesis, most recently on 06/19/2022 who presented to the ER on 07/25/2022 for evaluation of worsening shortness of breath with associated chest tightness.    ED course and evaluation --- BP 135/99 with RR 29 with O2 sat 90% on increased O2 flow rate of 5 L/min.  Labs with troponin 15>>17, BNP 2935.  Treated with IV Lasix in the ED.  Chest x-ray showed -- Small right pleural effusion, increased since prior study.  Increasing right basilar consolidation, which may reflect airspace disease or progressive atelectasis.  Mass-like linear consolidation right suprahilar region, unchanged since recent CT.  Patient was admitted to hospitalist service and IV diuresis was continued.    IR was consulted for thoracentesis which was done on 10/5 with 900 cc fluid removed.   Hospital course complicated by: --Acute kidney injury after diuresis due to cardiorenal physiology.  Nephrology was consulted.   Renal function improved with diuretics held, but pt with persistent and worsening exertional dyspnea again. --Abdominal pain for which evaluation including abdominal xray and CT abdomen/pelvis was unremarkable. --Swallowing difficulties with severe esophageal dysmotility discovered on barium esophagram.    10/19.  Patient developed  worsening hypoxemia, currently on high flow oxygen.  We will place a Pleurx catheter as patient pleural effusion has reaccumulated, planned on Monday. Also restarted Lasix drip with milrinone after cardiology consult. 10/20.  Lasix discontinued as patient renal function is getting worse. 10/24.  Paracentesis performed, hemodialysis is planned.   10/25.  Called with blood pressure in the 60s.  Fluid bolus given.  Lasix and spironolactone discontinued.  We will start midodrine.  Yesterday with palliative care they did not want pressors.  Called patient's daughter with multiorgan failure and increasing oxygen requirements.  Case discussed with palliative care.  10/26 through 10/27 patient still has cardiogenic shock, still on heated high flow nasal cannula, still with acute kidney injury.  10/28.  Critical care team would like to remove the chest tube but patient does not want to remove chest tube at this time.  Increased risk of infection explained.   Assessment and Plan: * Acute on chronic combined systolic and diastolic CHF (congestive heart failure) (HCC) Echo 01/30/22 -- EF 35-40%, grade II diastolic dysfunction, moderate MR, mod-severe TR, moderate AS. Patient currently on milrinone drip.  Overall prognosis is poor with cardiogenic shock. Low-dose torsemide if blood pressure allows.   Cardiogenic shock (HCC) Continue milrinone drip.  Continue midodrine.  Acute on chronic respiratory failure with hypoxia (HCC) Baseline on 4 L/min continuous O2. This morning she was on heated high flow nasal cannula 50% oxygen and 50 L flow  Pleural effusion on right  Chart reviewed: prior thoracenteses done this year on 1/30, 2/8, 4/16, 5/12, 7/25, 8/8, 9/5 --Underwent thoracentesis on 10/5 with 900 cc fluid removed. --Currently has a right-sided chest tube.  Critical care team will speak with the patient about removing chest tube.  When I spoke with her she did not want this to be removed.  Increased  risk of infection explained.  Cardiorenal syndrome with renal failure Nephrology signed off because the patient does not want dialysis.    Paroxysmal atrial fibrillation (HCC) --Continue amiodarone and Eliquis.  Unable to tolerate beta-blocker secondary to hypotension.  Acute kidney injury superimposed on CKD ([place]) Acute kidney injury on CKD stage IIIb.  Nephrology signed off.  Severe protein-calorie malnutrition ([place]) Encouraged to eat.  Patient refused NG tube.    Prolonged QT interval Continue to monitor on telemetry.  Malignant neoplasm of upper lobe of right lung Malignant neoplasm of lower third of esophagus Patient is status post radiation therapy for esophageal cancer S/p radiation therapy for lung cancer   HLD (hyperlipidemia) On Lipitor  Abdominal pain Continue to monitor.  Anemia of chronic disease Last hemoglobin 9.4  Hypomagnesemia Replaced  Chronic obstructive pulmonary disease (HCC) Severe COPD with chronic respiratory failure on 4 L of continuous O2 at home.   Depression Continue Celexa 20 mg daily  Carcinoma of overlapping sites of left breast in female, estrogen receptor positive ([place]) Noted. No acute issues.        Subjective: Patient feels significantly.  No significant shortness of breath.  Initially admitted with shortness of breath, recurrent pleural effusion acute respiratory failure and acute systolic heart failure  Physical Exam: Vitals:   08/11/22 0800 08/11/22 0830 08/11/22 0900 08/11/22 1000  BP: 97/72  90/68 91/69  Pulse: (!) 134 (!) 113 (!) 135 (!) 125  Resp: 14 14 15 20   Temp:  98 F (36.7 C)    TempSrc:  Oral    SpO2: 91% 91% 91% 90%  Weight:      Height:       Physical Exam HENT:     Head: Normocephalic.     Mouth/Throat:     Pharynx: No oropharyngeal exudate.  Eyes:     General: Lids are normal.     Conjunctiva/sclera: Conjunctivae normal.  Cardiovascular:     Rate and Rhythm: Regular rhythm. Tachycardia  present.     Heart sounds: Normal heart sounds, S1 normal and S2 normal.  Pulmonary:     Breath sounds: Examination of the right-lower field reveals decreased breath sounds and rhonchi. Examination of the left-lower field reveals decreased breath sounds. Decreased breath sounds and rhonchi present. No wheezing or rales.  Abdominal:     Palpations: Abdomen is soft.     Tenderness: There is no abdominal tenderness.  Musculoskeletal:     Right lower leg: Swelling present.     Left lower leg: Swelling present.  Skin:    General: Skin is warm.     Findings: No rash.  Neurological:     Mental Status: She is alert and oriented to person, place, and time.     Data Reviewed: No new data today.  Family Communication: Updated patient's daughter on the phone  Disposition: Status is: Inpatient Remains inpatient appropriate because: Patient with cardiogenic shock on milrinone drip.  Planned Discharge Destination: To be determined    Time spent: 28 minutes Case discussed with critical care team  Author: Loletha Grayer, MD 08/11/2022 12:18 PM  For on call review [email or web address].

## 2022-08-12 DIAGNOSIS — E871 Hypo-osmolality and hyponatremia: Secondary | ICD-10-CM

## 2022-08-12 DIAGNOSIS — I1310 Hypertensive heart and chronic kidney disease without heart failure, with stage 1 through stage 4 chronic kidney disease, or unspecified chronic kidney disease: Secondary | ICD-10-CM | POA: Diagnosis not present

## 2022-08-12 DIAGNOSIS — I5043 Acute on chronic combined systolic (congestive) and diastolic (congestive) heart failure: Secondary | ICD-10-CM | POA: Diagnosis not present

## 2022-08-12 DIAGNOSIS — J90 Pleural effusion, not elsewhere classified: Secondary | ICD-10-CM | POA: Diagnosis not present

## 2022-08-12 DIAGNOSIS — R570 Cardiogenic shock: Secondary | ICD-10-CM | POA: Diagnosis not present

## 2022-08-12 LAB — MAGNESIUM: Magnesium: 2.3 mg/dL (ref 1.7–2.4)

## 2022-08-12 LAB — CBC
HCT: 28.6 % — ABNORMAL LOW (ref 36.0–46.0)
Hemoglobin: 9.5 g/dL — ABNORMAL LOW (ref 12.0–15.0)
MCH: 30.3 pg (ref 26.0–34.0)
MCHC: 33.2 g/dL (ref 30.0–36.0)
MCV: 91.1 fL (ref 80.0–100.0)
Platelets: 308 10*3/uL (ref 150–400)
RBC: 3.14 MIL/uL — ABNORMAL LOW (ref 3.87–5.11)
RDW: 15.2 % (ref 11.5–15.5)
WBC: 10.4 10*3/uL (ref 4.0–10.5)
nRBC: 0.9 % — ABNORMAL HIGH (ref 0.0–0.2)

## 2022-08-12 LAB — BASIC METABOLIC PANEL
Anion gap: 13 (ref 5–15)
BUN: 95 mg/dL — ABNORMAL HIGH (ref 8–23)
CO2: 22 mmol/L (ref 22–32)
Calcium: 9.3 mg/dL (ref 8.9–10.3)
Chloride: 97 mmol/L — ABNORMAL LOW (ref 98–111)
Creatinine, Ser: 4.19 mg/dL — ABNORMAL HIGH (ref 0.44–1.00)
GFR, Estimated: 11 mL/min — ABNORMAL LOW (ref >60)
Glucose, Bld: 101 mg/dL — ABNORMAL HIGH (ref 70–99)
Potassium: 4.7 mmol/L (ref 3.5–5.1)
Sodium: 132 mmol/L — ABNORMAL LOW (ref 135–145)

## 2022-08-12 MED ORDER — METOPROLOL TARTRATE 5 MG/5ML IV SOLN: 5.0000 mg | INTRAVENOUS | Status: DC | PRN

## 2022-08-12 NOTE — Progress Notes (Signed)
Chest Tube Removal:  Pigtail catheter sutures were cut and chest tube site cleaned with chloroprep. Chest tube was removed while the patient was instructed to hum. Tube removed in one piece. Wound site was immediately occluded with Xeroform and sterile gauze, pressure held and then taped down with tegaderm. Patient tolerated removal well with no immediate complications.  Should patient develop recurrent pleural effusion, as is anticipated given her chronic condition and end stage disease, would recommend against placement of another chest tube. Would defer IPC placement to IR in consultation with medicine. Recommend continued goals of care conversations.  Armando Reichert, MD [facility]

## 2022-08-12 NOTE — Progress Notes (Signed)
Progress Note   Patient: Danielle [name] OZD:[id number] DOB: August 15, 1956 DOA: 08/04/2022     25 DOS: the patient was seen and examined on 08/12/2022   Brief hospital course: LESLI ISSA is a 66 y.o. female with medical history significant for Paroxysmal A-fib on chronic anticoagulation therapy, history of chronic systolic heart failure, history of adenocarcinoma of the right lung status post radiation therapy, history of stage IIIb chronic kidney disease, aortic valve stenosis, COPD with chronic respiratory failure on 4 L of oxygen continuous, history of left breast cancer status post mastectomy, history of esophageal cancer recurrent right-sided pleural effusion requiring thoracentesis, most recently on 06/19/2022 who presented to the ER on 08/12/2022 for evaluation of worsening shortness of breath with associated chest tightness.    ED course and evaluation --- BP 135/99 with RR 29 with O2 sat 90% on increased O2 flow rate of 5 L/min.  Labs with troponin 15>>17, BNP 2935.  Treated with IV Lasix in the ED.  Chest x-ray showed -- Small right pleural effusion, increased since prior study.  Increasing right basilar consolidation, which may reflect airspace disease or progressive atelectasis.  Mass-like linear consolidation right suprahilar region, unchanged since recent CT.  Patient was admitted to hospitalist service and IV diuresis was continued.    IR was consulted for thoracentesis which was done on 10/5 with 900 cc fluid removed.   Hospital course complicated by: --Acute kidney injury after diuresis due to cardiorenal physiology.  Nephrology was consulted.   Renal function improved with diuretics held, but pt with persistent and worsening exertional dyspnea again. --Abdominal pain for which evaluation including abdominal xray and CT abdomen/pelvis was unremarkable. --Swallowing difficulties with severe esophageal dysmotility discovered on barium esophagram.    10/19.  Patient developed  worsening hypoxemia, currently on high flow oxygen.  We will place a Pleurx catheter as patient pleural effusion has reaccumulated, planned on Monday. Also restarted Lasix drip with milrinone after cardiology consult. 10/20.  Lasix discontinued as patient renal function is getting worse. 10/24.  Paracentesis performed, hemodialysis is planned.   10/25.  Called with blood pressure in the 60s.  Fluid bolus given.  Lasix and spironolactone discontinued.  We will start midodrine.  Yesterday with palliative care they did not want pressors.  Called patient's daughter with multiorgan failure and increasing oxygen requirements.  Case discussed with palliative care.  10/26 through 10/27 patient still has cardiogenic shock, still on heated high flow nasal cannula, still with acute kidney injury.  10/28.  Critical care team would like to remove the chest tube but patient does not want to remove chest tube at this time.  Increased risk of infection explained.  10/29.  Critical care team to speak with the patient about chest tube removal.  Assessment and Plan: * Acute on chronic combined systolic and diastolic CHF (congestive heart failure) ([place]) Echo 01/30/22 -- EF 35-40%, grade II diastolic dysfunction, moderate MR, mod-severe TR, moderate AS. Patient currently on milrinone drip.  Overall prognosis is poor with cardiogenic shock. Low-dose torsemide if blood pressure allows.   Cardiogenic shock (HCC) Continue milrinone drip.  Continue midodrine.  Acute on chronic respiratory failure with hypoxia (HCC) Baseline on 4 L/min continuous O2. This morning she was on heated high flow nasal cannula 50% oxygen and 50 L flow  Pleural effusion on right  Chart reviewed: prior thoracenteses done this year on 1/30, 2/8, 4/16, 5/12, 7/25, 8/8, 9/5 --Underwent thoracentesis on 10/5 with 900 cc fluid removed. --Currently has a right-sided chest  tube.  Critical care team will speak with the patient about removing the  chest tube again today  Cardiorenal syndrome with renal failure Nephrology signed off because the patient does not want dialysis.    Paroxysmal atrial fibrillation (HCC) --Continue amiodarone and Eliquis.  Unable to tolerate beta-blocker secondary to hypotension.  Acute kidney injury superimposed on CKD ([place]) Acute kidney injury on CKD stage IIIb.  Nephrology signed off.  Severe protein-calorie malnutrition ([place]) Encouraged to eat.  Patient refused NG tube.    Prolonged QT interval Continue to monitor on telemetry.  Malignant neoplasm of upper lobe of right lung Malignant neoplasm of lower third of esophagus Patient is status post radiation therapy for esophageal cancer S/p radiation therapy for lung cancer   HLD (hyperlipidemia) On Lipitor  Abdominal pain Continue to monitor.  Anemia of chronic disease Last hemoglobin 9.5  Hypomagnesemia Replaced  Chronic obstructive pulmonary disease (HCC) Severe COPD with chronic respiratory failure on 4 L of continuous O2 at home.   Depression Continue Celexa 20 mg daily  Hyponatremia Sodium a few points lower than normal range.  Likely secondary to hypervolemic state.  Carcinoma of overlapping sites of left breast in female, estrogen receptor positive ([place]) Noted. No acute issues.        Subjective: Patient complains of a little pain.  Patient states her appetite is not good.  Heart rate sometimes goes up into the 160s.  Admitted with shortness of breath and recurrent pleural effusions and acute respiratory failure and acute systolic heart failure.  Physical Exam: Vitals:   08/12/22 0900 08/12/22 1000 08/12/22 1100 08/12/22 1200  BP: 94/72 98/75 (!) 85/66 (!) 88/69  Pulse: (!) 142 (!) 109 84 (!) 120  Resp: 18 (!) 21 17 18   Temp: 97.9 F (36.6 C)     TempSrc: Oral     SpO2: (!) 87% 96% (!) 84% (!) 85%  Weight:      Height:       Physical Exam HENT:     Head: Normocephalic.     Mouth/Throat:     Pharynx: No  oropharyngeal exudate.  Eyes:     General: Lids are normal.     Conjunctiva/sclera: Conjunctivae normal.  Cardiovascular:     Rate and Rhythm: Regular rhythm. Tachycardia present.     Heart sounds: Normal heart sounds, S1 normal and S2 normal.  Pulmonary:     Breath sounds: Examination of the right-lower field reveals decreased breath sounds and rhonchi. Examination of the left-lower field reveals decreased breath sounds. Decreased breath sounds and rhonchi present. No wheezing or rales.  Abdominal:     Palpations: Abdomen is soft.     Tenderness: There is no abdominal tenderness.  Musculoskeletal:     Right lower leg: Swelling present.     Left lower leg: Swelling present.  Skin:    General: Skin is warm.     Findings: No rash.  Neurological:     Mental Status: She is alert and oriented to person, place, and time.     Data Reviewed: Sodium 132, creatinine 4.09, BUN 95, hemoglobin 9.5  Family Communication: Updated patient's daughter on the phone  Disposition: Status is: Inpatient Remains inpatient appropriate because: Still on heated high flow nasal cannula 50% FiO2 18, still on milrinone drip for cardiogenic shock  Planned Discharge Destination: To be determined    Time spent: 28 minutes Case discussed with critical care specialist and nursing staff  Author: Loletha Grayer, MD 08/12/2022 12:33 PM  For on  call review [email or web address].

## 2022-08-12 NOTE — Progress Notes (Signed)
Hassan Rowan, NP notified of HR up to 130-140's. Call if HR sustains 150.

## 2022-08-12 NOTE — Assessment & Plan Note (Addendum)
Comfort care measures

## 2022-08-12 NOTE — Progress Notes (Signed)
Pt reports that her breathing is "not good" today; appears tachypneic, in 30's slightly labored. O2 sats intermittently drop into 70's, but recovers HHF O2 50 loiters and 50%. Telemetry shows afib with rates up to 160, but not sustained. Chest tube draining serous fluid, but decreased from yesterday. Given morphine for right sided chest pain, which she states is increased with inspiration. Pt also declines to be turned or repositioned at this time due to discomfort. Pt ate all of oatmeal and drank milk, took all po meds whole with water. Resting at this time.

## 2022-08-12 NOTE — Progress Notes (Signed)
Post chest tube removal, chest tube site leaking serous fluid; saturated dressing, reinforced. Pt reported that she needed more oxygen especially when exerting herself, turning and positioning. Fio2 was increased to 80% with improved comfort, decreased HR, (110-130) and increased O2 sats, up to 100%.

## 2022-08-13 ENCOUNTER — Inpatient Hospital Stay: Payer: Medicare Other

## 2022-08-13 DIAGNOSIS — R570 Cardiogenic shock: Secondary | ICD-10-CM | POA: Diagnosis not present

## 2022-08-13 DIAGNOSIS — I1310 Hypertensive heart and chronic kidney disease without heart failure, with stage 1 through stage 4 chronic kidney disease, or unspecified chronic kidney disease: Secondary | ICD-10-CM | POA: Diagnosis not present

## 2022-08-13 DIAGNOSIS — J9621 Acute and chronic respiratory failure with hypoxia: Secondary | ICD-10-CM | POA: Diagnosis not present

## 2022-08-13 DIAGNOSIS — I5043 Acute on chronic combined systolic (congestive) and diastolic (congestive) heart failure: Secondary | ICD-10-CM | POA: Diagnosis not present

## 2022-08-13 MED ORDER — MEGESTROL ACETATE 400 MG/10ML PO SUSP
200.0000 mg | Freq: Every day | ORAL | Status: DC
  Administered 2022-08-13 – 2022-08-24 (×11): 200 mg via ORAL
  Filled 2022-08-13 (×12): qty 10

## 2022-08-13 NOTE — Progress Notes (Signed)
Encouraged patient to eat and drink. She has drank her Boost Breeze only today. She states she "does not have an appetite" and "will eat some of the next meal."   This RN has discussed with her the options of an NG tube or G tube placement for nutritional purposes. She quickly responded no to an NG stating "nothing in my nose." She seemed to consider a g tube both does not want anything done that will put her to sleep.

## 2022-08-13 NOTE — Progress Notes (Signed)
Progress Note   Patient: Danielle [name] OVZ:[id number] DOB: [birth year] DOA: 08/04/2022     26 DOS: the patient was seen and examined on 08/13/2022   Brief hospital course: Danielle RENNAKER is a 66 y.o. female with medical history significant for Paroxysmal A-fib on chronic anticoagulation therapy, history of chronic systolic heart failure, history of adenocarcinoma of the right lung status post radiation therapy, history of stage IIIb chronic kidney disease, aortic valve stenosis, COPD with chronic respiratory failure on 4 L of oxygen continuous, history of left breast cancer status post mastectomy, history of esophageal cancer recurrent right-sided pleural effusion requiring thoracentesis, most recently on 06/19/2022 who presented to the ER on 07/15/2022 for evaluation of worsening shortness of breath with associated chest tightness.    ED course and evaluation --- BP 135/99 with RR 29 with O2 sat 90% on increased O2 flow rate of 5 L/min.  Labs with troponin 15>>17, BNP 2935.  Treated with IV Lasix in the ED.  Chest x-ray showed -- Small right pleural effusion, increased since prior study.  Increasing right basilar consolidation, which may reflect airspace disease or progressive atelectasis.  Mass-like linear consolidation right suprahilar region, unchanged since recent CT.  Patient was admitted to hospitalist service and IV diuresis was continued.    IR was consulted for thoracentesis which was done on 10/5 with 900 cc fluid removed.   Hospital course complicated by: --Acute kidney injury after diuresis due to cardiorenal physiology.  Nephrology was consulted.   Renal function improved with diuretics held, but pt with persistent and worsening exertional dyspnea again. --Abdominal pain for which evaluation including abdominal xray and CT abdomen/pelvis was unremarkable. --Swallowing difficulties with severe esophageal dysmotility discovered on barium esophagram.    10/19.  Patient developed  worsening hypoxemia, currently on high flow oxygen.  We will place a Pleurx catheter as patient pleural effusion has reaccumulated, planned on Monday. Also restarted Lasix drip with milrinone after cardiology consult. 10/20.  Lasix discontinued as patient renal function is getting worse. 10/24.  Paracentesis performed, hemodialysis is planned.   10/25.  Called with blood pressure in the 60s.  Fluid bolus given.  Lasix and spironolactone discontinued.  We will start midodrine.  Yesterday with palliative care they did not want pressors.  Called patient's daughter with multiorgan failure and increasing oxygen requirements.  Case discussed with palliative care.  10/26 through 10/27 patient still has cardiogenic shock, still on heated high flow nasal cannula, still with acute kidney injury.  10/28.  Critical care team would like to remove the chest tube but patient does not want to remove chest tube at this time.  Increased risk of infection explained.  10/29.  Critical care team removed chest tube  10/30.  Patient with increased oxygen requirements on 61% FiO2 heated high flow nasal cannula.  Assessment and Plan: * Acute on chronic combined systolic and diastolic CHF (congestive heart failure) (HCC) Echo 01/30/22 -- EF 35-40%, grade II diastolic dysfunction, moderate MR, mod-severe TR, moderate AS. Patient currently on milrinone drip.  Overall prognosis is poor with cardiogenic shock. Low-dose torsemide if blood pressure allows.   Cardiogenic shock (HCC) Continue milrinone drip.  Continue midodrine.  Acute on chronic respiratory failure with hypoxia (HCC) Baseline on 4 L/min continuous O2. This morning she was on heated high flow nasal cannula 61% oxygen and 20 L flow  Pleural effusion on right  Chart reviewed: prior thoracenteses done this year on 1/30, 2/8, 4/16, 5/12, 7/25, 8/8, 9/5 --Underwent thoracentesis on 10/5  with 900 cc fluid removed. -Critical care team removed the right chest  tube on 08/12/2022.  Fluid will likely reaccumulate.  Critical care team does not recommend replacing chest tube.  Cardiorenal syndrome with renal failure Nephrology signed off because the patient does not want dialysis.    Paroxysmal atrial fibrillation (HCC) --Continue amiodarone and Eliquis.  Unable to tolerate beta-blocker secondary to hypotension.  Acute kidney injury superimposed on CKD ([place]) Acute kidney injury on CKD stage IIIb.  Nephrology signed off.  Protein-calorie malnutrition, severe ([place]) Encouraged to eat.  Trial of Megace.    Prolonged QT interval Continue to monitor on telemetry.  Malignant neoplasm of upper lobe of right lung Malignant neoplasm of lower third of esophagus Patient is status post radiation therapy for esophageal cancer S/p radiation therapy for lung cancer   HLD (hyperlipidemia) On Lipitor  Abdominal pain Continue to monitor.  Anemia of chronic disease Last hemoglobin 9.5  Hypomagnesemia Replaced  Chronic obstructive pulmonary disease (HCC) Severe COPD with chronic respiratory failure on 4 L of continuous O2 at home.   Depression Continue Celexa 20 mg daily  Hyponatremia Sodium a few points lower than normal range.  Likely secondary to hypervolemic state.  Carcinoma of overlapping sites of left breast in female, estrogen receptor positive ([place]) Noted. No acute issues.        Subjective: Patient with a little more shortness of breath.  Oxygen appointments went up a little bit too much FiO2 to 61% on heated high flow nasal cannula.  Admitted with recurrent pleural effusion  Physical Exam: Vitals:   08/13/22 0753 08/13/22 0800 08/13/22 1000 08/13/22 1200  BP:  98/74 90/60 90/61   Pulse:  (!) 106 100 (!) 122  Resp:  18 20 19   Temp:      TempSrc:  Oral    SpO2: 100% 99% 98% 95%  Weight:      Height:       Physical Exam HENT:     Head: Normocephalic.     Mouth/Throat:     Pharynx: No oropharyngeal exudate.  Eyes:      General: Lids are normal.     Conjunctiva/sclera: Conjunctivae normal.  Cardiovascular:     Rate and Rhythm: Regular rhythm. Tachycardia present.     Heart sounds: Normal heart sounds, S1 normal and S2 normal.  Pulmonary:     Breath sounds: Examination of the right-lower field reveals decreased breath sounds and rhonchi. Examination of the left-lower field reveals decreased breath sounds. Decreased breath sounds and rhonchi present. No wheezing or rales.  Abdominal:     Palpations: Abdomen is soft.     Tenderness: There is no abdominal tenderness.  Musculoskeletal:     Right lower leg: Swelling present.     Left lower leg: Swelling present.  Skin:    General: Skin is warm.     Findings: No rash.  Neurological:     Mental Status: She is alert and oriented to person, place, and time.     Data Reviewed: Chest x-ray reviewed.  Interval removal of right chest tube.  Haziness right lower lung field suggesting small residual right pleural effusion.  No pneumothorax.  No focal infiltrates.  Family Communication: Updated patient's daughter on phone  Disposition: Status is: Inpatient Remains inpatient appropriate because: Anticipate patient's respiratory status to worsen once fluid draining accumulates.  Still on heated high flow nasal cannula 61% oxygen.  Planned Discharge Destination: Unknown    Time spent: 28 minutes Spoke with critical care team.  Author: Loletha Grayer, MD 08/13/2022 2:13 PM  For on call review [email or web address].

## 2022-08-13 NOTE — Progress Notes (Signed)
Nutrition Follow Up Note   DOCUMENTATION CODES:   Severe malnutrition in context of chronic illness  INTERVENTION:   Boost Breeze po TID, each supplement provides 250 kcal and 9 grams of protein  Magic cup TID with meals, each supplement provides 290 kcal and 9 grams of protein  MVI po daily   Pt at high refeed risk; recommend monitor potassium, magnesium and phosphorus labs daily until stable  Recommend consideration of appetite stimulant  Liberalize diet   NUTRITION DIAGNOSIS:   Severe Malnutrition related to chronic illness as evidenced by severe fat depletion, severe muscle depletion.  GOAL:   Patient will meet greater than or equal to 90% of their needs -not met   MONITOR:   PO intake, Supplement acceptance, Labs, Weight trends, Skin, I & O's  ASSESSMENT:   66 y.o. 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  Pt s/p chest tube placement 10/24 (removed 10/29)  Pt continues to have poor appetite and oral intake. Pt eating <25% of meals and most of the time will refuse meals or eat only sips and bites. Spoke with RN this morning, pt did not touch her breakfast tray but she did drink 100% of a Boost Breeze. Discussed recommendation for feeding tube with MD but patient refuses feeding tube placement. Pt has also declined dialysis. RN is working with pt to encourage her to eat. Can consider appetite  stimulant. Palliative care continues to follow for [place]. RD continues to recommend feeding tube placement if patient wishes to pursue full aggressive care. Pt remains at high refeed risk.   Per chart, pt is down ~4lbs since admission.   Medications reviewed and include: celexa, MVI, protonix, miralax, senokot, torsemide  Labs reviewed: Na 132(L), K 4.7 wnl, BUN 95(H), creat 4.19(H), Mg 2.3 wnl Hgb 9.5(L), Hct 28.6(L)  Diet Order:   Diet Order             Diet Heart Room service appropriate? Yes; Fluid consistency: Thin  Diet effective now                  EDUCATION NEEDS:   Education needs have been addressed  Skin:  Skin Assessment: Reviewed RN Assessment (incision chest)  Last BM:  10/30- type 6  Height:   Ht Readings from Last 1 Encounters:  08/02/22 _0  (1.676 m)    Weight:   Wt Readings from Last 1 Encounters:  08/13/22 48.3 kg    Ideal Body Weight:  59 kg  BMI:  Body mass index is 17.19 kg/m.  Estimated Nutritional Needs:   Kcal:  1400-1600kcal/day  Protein:  70-80g/day  Fluid:  1.3-1.5L/day  [name] MS, RD, LDN Please refer to [facility] for RD and/or RD on-call/weekend/after hours pager

## 2022-08-13 NOTE — Plan of Care (Signed)
  Problem: Education: Goal: Ability to demonstrate management of disease process will improve Outcome: Not Progressing Goal: Ability to verbalize understanding of medication therapies will improve Outcome: Not Progressing Goal: Individualized Educational Video(s) Outcome: Not Progressing   Problem: Activity: Goal: Capacity to carry out activities will improve Outcome: Not Progressing   Problem: Cardiac: Goal: Ability to achieve and maintain adequate cardiopulmonary perfusion will improve Outcome: Not Progressing   Problem: Education: Goal: Knowledge of General Education information will improve Description: Including pain rating scale, medication(s)/side effects and non-pharmacologic comfort measures Outcome: Not Progressing   Problem: Health Behavior/Discharge Planning: Goal: Ability to manage health-related needs will improve Outcome: Not Progressing   Problem: Clinical Measurements: Goal: Ability to maintain clinical measurements within normal limits will improve Outcome: Not Progressing Goal: Will remain free from infection Outcome: Not Progressing Goal: Diagnostic test results will improve Outcome: Not Progressing Goal: Respiratory complications will improve Outcome: Not Progressing Goal: Cardiovascular complication will be avoided Outcome: Not Progressing   Problem: Activity: Goal: Risk for activity intolerance will decrease Outcome: Not Progressing   Problem: Nutrition: Goal: Adequate nutrition will be maintained Outcome: Not Progressing   Problem: Coping: Goal: Level of anxiety will decrease Outcome: Not Progressing   Problem: Elimination: Goal: Will not experience complications related to bowel motility Outcome: Not Progressing Goal: Will not experience complications related to urinary retention Outcome: Not Progressing   Problem: Pain Managment: Goal: General experience of comfort will improve Outcome: Not Progressing   Problem: Safety: Goal:  Ability to remain free from injury will improve Outcome: Not Progressing   Problem: Skin Integrity: Goal: Risk for impaired skin integrity will decrease Outcome: Not Progressing

## 2022-08-13 NOTE — Progress Notes (Signed)
Site from chest tube that was removed yesterday was found to be saturated. Old dressing removed, site cleaned, vaseline gauze, regular gauze and large tegaderm applied.

## 2022-08-14 DIAGNOSIS — R570 Cardiogenic shock: Secondary | ICD-10-CM | POA: Diagnosis not present

## 2022-08-14 DIAGNOSIS — Z7189 Other specified counseling: Secondary | ICD-10-CM | POA: Diagnosis not present

## 2022-08-14 DIAGNOSIS — R079 Chest pain, unspecified: Secondary | ICD-10-CM

## 2022-08-14 DIAGNOSIS — J9621 Acute and chronic respiratory failure with hypoxia: Secondary | ICD-10-CM | POA: Diagnosis not present

## 2022-08-14 DIAGNOSIS — I5043 Acute on chronic combined systolic (congestive) and diastolic (congestive) heart failure: Secondary | ICD-10-CM | POA: Diagnosis not present

## 2022-08-14 DIAGNOSIS — N179 Acute kidney failure, unspecified: Secondary | ICD-10-CM | POA: Diagnosis not present

## 2022-08-14 LAB — CBC
HCT: 27 % — ABNORMAL LOW (ref 36.0–46.0)
Hemoglobin: 9 g/dL — ABNORMAL LOW (ref 12.0–15.0)
MCH: 30.1 pg (ref 26.0–34.0)
MCHC: 33.3 g/dL (ref 30.0–36.0)
MCV: 90.3 fL (ref 80.0–100.0)
Platelets: 340 10*3/uL (ref 150–400)
RBC: 2.99 MIL/uL — ABNORMAL LOW (ref 3.87–5.11)
RDW: 15.5 % (ref 11.5–15.5)
WBC: 7.8 10*3/uL (ref 4.0–10.5)
nRBC: 0.6 % — ABNORMAL HIGH (ref 0.0–0.2)

## 2022-08-14 LAB — BASIC METABOLIC PANEL
Anion gap: 15 (ref 5–15)
BUN: 91 mg/dL — ABNORMAL HIGH (ref 8–23)
CO2: 21 mmol/L — ABNORMAL LOW (ref 22–32)
Calcium: 9.3 mg/dL (ref 8.9–10.3)
Chloride: 96 mmol/L — ABNORMAL LOW (ref 98–111)
Creatinine, Ser: 4.66 mg/dL — ABNORMAL HIGH (ref 0.44–1.00)
GFR, Estimated: 10 mL/min — ABNORMAL LOW (ref >60)
Glucose, Bld: 107 mg/dL — ABNORMAL HIGH (ref 70–99)
Potassium: 4.2 mmol/L (ref 3.5–5.1)
Sodium: 132 mmol/L — ABNORMAL LOW (ref 135–145)

## 2022-08-14 LAB — TROPONIN I (HIGH SENSITIVITY)
Troponin I (High Sensitivity): 100 ng/L (ref <18)
Troponin I (High Sensitivity): 96 ng/L — ABNORMAL HIGH (ref <18)

## 2022-08-14 NOTE — TOC Progression Note (Signed)
Transition of Care [facility]) - Progression Note    Patient Details  Name: Danielle [name] MRN: [id number] Date of Birth: [birth year]  [facility] CM/SW Contact  Shelbie Hutching, RN Phone Number: 08/14/2022, 11:33 AM  Clinical Narrative:     Patient remains in the ICU stepdown status on HFNC 50L 50% FiO2.  Patient is on Milrinone infusion. TOC will cont to follow.   Expected Discharge Plan: Home/Self Care Barriers to Discharge: Continued Medical Work up  Expected Discharge Plan and Services Expected Discharge Plan: Home/Self Care   Discharge Planning Services: CM Consult   Living arrangements for the past 2 months: Single Family Home                                       Social Determinants of Health (SDOH) Interventions    Readmission Risk Interventions    05/09/2022    3:11 PM 01/29/2022   11:36 AM 11/29/2021   10:49 AM  Readmission Risk Prevention Plan  Transportation Screening Complete Complete Complete  Medication Review Press photographer) Complete Complete Complete  PCP or Specialist appointment within 3-5 days of discharge  Complete Complete  HRI or Home Care Consult Patient refused  Complete  SW Recovery Care/Counseling Consult Complete Complete Complete  Palliative Care Screening Not Applicable Not Applicable Not [place] Not Applicable Not Applicable Not Applicable

## 2022-08-14 NOTE — Progress Notes (Signed)
Progress Note   Patient: Danielle [name] AQT:[id number] DOB: 1956/06/02 DOA: 07/19/2022     27 DOS: the patient was seen and examined on 08/14/2022   Brief hospital course: RANDEE HUSTON is a 66 y.o. female with medical history significant for Paroxysmal A-fib on chronic anticoagulation therapy, history of chronic systolic heart failure, history of adenocarcinoma of the right lung status post radiation therapy, history of stage IIIb chronic kidney disease, aortic valve stenosis, COPD with chronic respiratory failure on 4 L of oxygen continuous, history of left breast cancer status post mastectomy, history of esophageal cancer recurrent right-sided pleural effusion requiring thoracentesis, most recently on 06/19/2022 who presented to the ER on 07/21/2022 for evaluation of worsening shortness of breath with associated chest tightness.    ED course and evaluation --- BP 135/99 with RR 29 with O2 sat 90% on increased O2 flow rate of 5 L/min.  Labs with troponin 15>>17, BNP 2935.  Treated with IV Lasix in the ED.  Chest x-ray showed -- Small right pleural effusion, increased since prior study.  Increasing right basilar consolidation, which may reflect airspace disease or progressive atelectasis.  Mass-like linear consolidation right suprahilar region, unchanged since recent CT.  Patient was admitted to hospitalist service and IV diuresis was continued.    IR was consulted for thoracentesis which was done on 10/5 with 900 cc fluid removed.   Hospital course complicated by: --Acute kidney injury after diuresis due to cardiorenal physiology.  Nephrology was consulted.   Renal function improved with diuretics held, but pt with persistent and worsening exertional dyspnea again. --Abdominal pain for which evaluation including abdominal xray and CT abdomen/pelvis was unremarkable. --Swallowing difficulties with severe esophageal dysmotility discovered on barium esophagram.    10/19.  Patient developed  worsening hypoxemia, currently on high flow oxygen.  We will place a Pleurx catheter as patient pleural effusion has reaccumulated, planned on Monday. Also restarted Lasix drip with milrinone after cardiology consult. 10/20.  Lasix discontinued as patient renal function is getting worse. 10/24.  Paracentesis performed, hemodialysis is planned.   10/25.  Called with blood pressure in the 60s.  Fluid bolus given.  Lasix and spironolactone discontinued.  We will start midodrine.  Yesterday with palliative care they did not want pressors.  Called patient's daughter with multiorgan failure and increasing oxygen requirements.  Case discussed with palliative care.  10/26 through 10/27 patient still has cardiogenic shock, still on heated high flow nasal cannula, still with acute kidney injury.  10/28.  Critical care team would like to remove the chest tube but patient does not want to remove chest tube at this time.  Increased risk of infection explained.  10/29.  Critical care team removed chest tube  10/30.  Patient with increased oxygen requirements on 61% FiO2 heated high flow nasal cannula.  10/31.  Patient complained of chest pain.  Cardiology did not want to do any aggressive management.  Morphine given.  Troponins with borderline elevation but flat.  Patient still on milrinone drip.  Fluid leaking out of the chest tube site.  Also has poor appetite.  He still on heated high flow nasal cannula.  Assessment and Plan: * Acute on chronic combined systolic and diastolic CHF (congestive heart failure) (HCC) Echo 01/30/22 -- EF 35-40%, grade II diastolic dysfunction, moderate MR, mod-severe TR, moderate AS. Patient currently on milrinone drip.  Overall prognosis is poor with cardiogenic shock. Low-dose torsemide if blood pressure allows.   Cardiogenic shock (HCC) Continue milrinone drip.  Continue  midodrine.  Acute on chronic respiratory failure with hypoxia (HCC) Baseline on 4 L/min continuous  O2. This morning she was on heated high flow nasal cannula 46% oxygen and 50 L flow  Chest pain Chest pain on 08/14/2022.  Call morphine given.  EKG reviewed by me shows atrial flutter 121 bpm, LVH, nonspecific ST-T wave changes.  Troponin with borderline elevation but flat.  Case discussed with cardiology.  Conservative management.  Pleural effusion on right  Chart reviewed: prior thoracenteses done this year on 1/30, 2/8, 4/16, 5/12, 7/25, 8/8, 9/5 --Underwent thoracentesis on 10/5 with 900 cc fluid removed. -Critical care team removed the right chest tube on 08/12/2022.  Fluid will likely reaccumulate.  Critical care team does not recommend replacing chest tube.  As per nursing staff fluid is leaking out of the previous chest tube site.  Cardiorenal syndrome with renal failure Nephrology signed off because the patient does not want dialysis.    Paroxysmal atrial fibrillation (HCC) --Continue amiodarone and Eliquis.  Unable to tolerate beta-blocker secondary to hypotension.  Acute kidney injury superimposed on CKD ([place]) Acute kidney injury on CKD stage IIIb.  Nephrology signed off.  Today's creatinine 4.66  Protein-calorie malnutrition, severe (HCC) Encouraged to eat.  Trial of Megace.    Prolonged QT interval Continue to monitor on telemetry.  Malignant neoplasm of upper lobe of right lung Malignant neoplasm of lower third of esophagus Patient is status post radiation therapy for esophageal cancer S/p radiation therapy for lung cancer   HLD (hyperlipidemia) On Lipitor  Abdominal pain Continue to monitor.  Anemia of chronic disease Last hemoglobin 9.0  Hypomagnesemia Replaced  Chronic obstructive pulmonary disease (HCC) Severe COPD with chronic respiratory failure on 4 L of continuous O2 at home.   Depression Continue Celexa 20 mg daily  Hyponatremia Sodium a few points lower than normal range.  Likely secondary to hypervolemic state.  Carcinoma of  overlapping sites of left breast in female, estrogen receptor positive ([place]) Noted. No acute issues.        Subjective: Patient complaining of chest pain this morning.  [place] out of 10 in intensity in the center of her chest.  Still has shortness of breath.  Initially admitted with recurrent pleural effusion acute on chronic hypoxic respiratory failure.  Patient still has heated high flow nasal cannula.  Physical Exam: Vitals:   08/14/22 0754 08/14/22 0800 08/14/22 0900 08/14/22 1000  BP:  93/72 103/88 (!) 78/57  Pulse:  (!) 139 (!) 125 (!) 120  Resp:  (!) 22 17 18   Temp:  98 F (36.7 C)    TempSrc:  Oral    SpO2: 93% 95% (!) 87% (!) 84%  Weight:      Height:       Physical Exam HENT:     Head: Normocephalic.     Mouth/Throat:     Pharynx: No oropharyngeal exudate.  Eyes:     General: Lids are normal.     Conjunctiva/sclera: Conjunctivae normal.  Cardiovascular:     Rate and Rhythm: Regular rhythm. Tachycardia present.     Heart sounds: Normal heart sounds, S1 normal and S2 normal.  Pulmonary:     Breath sounds: Examination of the right-lower field reveals decreased breath sounds and rhonchi. Examination of the left-lower field reveals decreased breath sounds. Decreased breath sounds and rhonchi present. No wheezing or rales.  Abdominal:     Palpations: Abdomen is soft.     Tenderness: There is no abdominal tenderness.  Musculoskeletal:  Right lower leg: Swelling present.     Left lower leg: Swelling present.  Skin:    General: Skin is warm.     Findings: No rash.  Neurological:     Mental Status: She is alert and oriented to person, place, and time.     Data Reviewed: EKG reviewed by me and the chest pain assessment and plan above. Troponin 100 and 96 Creatinine 4.66, sodium 132, hemoglobin 9.0  Family Communication: Updated patient's daughter on the phone  Disposition: Status is: Inpatient Remains inpatient appropriate because: Patient still on  milrinone drip, heated high flow nasal cannula, poor kidney function.  Planned Discharge Destination: Unclear at this point    Time spent: 28 minutes Case discussed with cardiology and palliative care  Author: Loletha Grayer, MD 08/14/2022 1:07 PM  For on call review [email or web address].

## 2022-08-14 NOTE — Progress Notes (Addendum)
Daily Progress Note   Patient Name: Danielle [name]       Date: 08/14/2022 DOB: [birth year]  Age: 66 y.o. MRN#: [id number] Attending Physician: Loletha Grayer, MD Primary Care Physician: [name], [name] Admit Date: 08/12/2022  Reason for Consultation/Follow-up: Establishing goals of care  Subjective: Spoke with staff. Patient desiring to continue current life prolonging care, and no dialysis or feeding tube.  In to see patient who confirms the same. Ultimately anticipate hospital death. PMT will shadow for decline.    Length of Stay: 27  Current Medications: Scheduled Meds:   amiodarone  200 mg Oral Daily   apixaban  2.5 mg Oral BID   atorvastatin  40 mg Oral Daily   Chlorhexidine Gluconate Cloth  6 each Topical Daily   citalopram  20 mg Oral Daily   feeding supplement  1 Container Oral TID BM   megestrol  200 mg Oral Daily   midodrine  10 mg Oral TID AC & HS   multivitamin with minerals  1 tablet Oral Daily   pantoprazole  40 mg Oral BID AC   polyethylene glycol  17 g Oral Daily   senna-docusate  2 tablet Oral BID   sodium chloride flush  3 mL Intravenous Q12H   torsemide  20 mg Oral Daily    Continuous Infusions:  sodium chloride Stopped (08/10/22 2209)   anticoagulant sodium citrate     milrinone 0.25 mcg/kg/min (08/14/22 1400)    PRN Meds: sodium chloride, acetaminophen **OR** acetaminophen, albuterol, alteplase, anticoagulant sodium citrate, calcium carbonate, guaiFENesin, heparin, HYDROcodone-acetaminophen, lidocaine (PF), lidocaine (PF), lidocaine-prilocaine, melatonin, metoprolol tartrate, morphine injection, pentafluoroprop-tetrafluoroeth, simethicone  Physical Exam Pulmonary:     Comments: On high flow cannula.  Neurological:     Mental Status: She is alert.              Vital Signs: BP (!) 83/61   Pulse (!) 114   Temp 98.4 F (36.9 C) (Oral)   Resp 14   Ht 5\' 6"  (1.676 m)   Wt 45 kg   SpO2 91%   BMI 16.01 kg/m  SpO2: SpO2: 91 % O2 Device: O2 Device: High Flow Nasal Cannula O2 Flow Rate: O2 Flow Rate (L/min): 50 L/min  Intake/output summary:  Intake/Output Summary (Last 24 hours) at 08/14/2022 1519 Last data filed at 08/14/2022 1400  Gross per 24 hour  Intake 98.19 ml  Output --  Net 98.19 ml   LBM: Last BM Date : 08/14/22 Baseline Weight: Weight: 50.3 kg Most recent weight: Weight: 45 kg    Patient Active Problem List   Diagnosis Date Noted   Chest pain 08/14/2022   Cardiogenic shock ([place]) 08/10/2022   Cardiorenal syndrome with renal failure 08/05/2022   Moderate aortic stenosis 08/02/2022   Moderate tricuspid regurgitation by prior echocardiogram 08/02/2022   Prolonged QT interval 07/19/2022   Streptococcal pneumonia ([place]) 06/20/2022   Dyspnea 05/23/2022   SOB (shortness of breath) 05/22/2022   Chronic respiratory failure with hypoxia (HCC)    Pleural effusion on right 05/08/2022   Paroxysmal atrial fibrillation ([place]) 03/10/2022   Dyslipidemia 03/10/2022   COPD, severe ([place]) 03/10/2022   Atrial fibrillation with RVR ([place]) 02/22/2022   TIA (transient ischemic attack) 01/29/2022   Epistaxis 01/27/2022   Pleural effusion 01/26/2022   C. difficile colitis 11/27/2021   Abdominal pain 11/26/2021   HLD (hyperlipidemia) 11/26/2021   Iron deficiency anemia 11/26/2021   Aspiration pneumonia of right lower lobe due to gastric secretions (HCC)    Ileus following gastrointestinal surgery ([place])    Hypomagnesemia    Anemia of chronic disease    Hyperphosphatemia 10/24/2021   Ileostomy status ([place]) 11/17/2020   Chronic obstructive pulmonary disease ([place]) 08/01/2020   Hypokalemia 01/20/2020   Elevated troponin 01/20/2020   Sepsis ([place]) 01/20/2020   Acute on chronic combined systolic and diastolic CHF (congestive heart  failure) ([place]) 01/20/2020   Pleural effusion due to CHF (congestive heart failure) ([place]) 11/06/2019   Diarrhea    GERD (gastroesophageal reflux disease) 10/29/2019   Depression 10/29/2019   Acute blood loss anemia 06/20/2019   Normocytic anemia 06/20/2019   Anemia of chronic kidney failure, stage 4 (severe) ([place]) 06/12/2019   Malignant neoplasm of lower third of esophagus ([place]) 05/01/2019   Acute on chronic respiratory failure with hypoxia and hypercapnia ([place]) 12/22/2018   Ileostomy dysfunction ([place]) 09/28/2018   Reflux esophagitis    Iron deficiency anemia secondary to blood loss (chronic)    SBO (small bowel obstruction) ([place]) 06/29/2018   Atrial flutter ([place]) 06/09/2018   Lymphedema 06/09/2018   Acute kidney injury superimposed on CKD ([place]) 06/04/2018   Protein-calorie malnutrition, severe ([place]) 06/04/2018   Focal (segmental) acute (reversible) ischemia of large intestine (HCC)    Ulceration of intestine    Diverticulosis of large intestine without diverticulitis    Colitis    Malnutrition of moderate degree 04/23/2018   Palliative care by specialist    DNR (do not resuscitate) discussion    Weakness    Acute on chronic respiratory failure with hypoxia ([place]) 04/21/2018   Hyponatremia 02/10/2017   Malignant neoplasm of upper lobe of right lung 07/30/2016   Lung nodule, solitary 07/25/2016   Malignant neoplasm of upper lobe of right lung ([place]) 07/25/2016   Carcinoma of overlapping sites of left breast in female, estrogen receptor positive ([place]) 06/22/2016   Multiple lung nodules 06/22/2016   Lesion of right lung    Breast cancer in female ([place]) 02/21/2016   Moderate COPD (chronic obstructive pulmonary disease) ([place]) 08/24/2014    Palliative Care Assessment & Plan    Recommendations/Plan: Patient does not want to shift to full comfort care. Continue current life prolonging care including high flow cannula. No PEG or dialysis. DNR/DNI. Anticipate hospital death.  PMT will  shadow for decline.   Code Status:    Code Status Orders  (  From admission, onward)           Start     Ordered   08/07/22 1343  Do not attempt resuscitation (DNR)  Continuous       Question Answer Comment  In the event of cardiac or respiratory ARREST Do not call a "code blue"   In the event of cardiac or respiratory ARREST Do not perform Intubation, CPR, defibrillation or ACLS   In the event of cardiac or respiratory ARREST Use medication by any route, position, wound care, and other measures to relive pain and suffering. May use oxygen, suction and manual treatment of airway obstruction as needed for comfort.      08/07/22 1342           Code Status History     Date Active Date Inactive Code Status Order ID Comments User Context   08/03/2022 1946 08/07/2022 1342 Full Code [id number]  Athena Masse, MD ED   06/17/2022 1013 06/21/2022 1951 Full Code [id number]  Clance Boll, MD ED   05/22/2022 1917 05/26/2022 2026 Full Code [id number]  Para Skeans, MD ED   05/08/2022 1108 05/12/2022 1654 Full Code [id number]  Collier Bullock, MD ED   03/10/2022 0151 03/11/2022 2340 Full Code [id number]  Mansy, Arvella Merles, MD ED   02/21/2022 2006 02/24/2022 1903 Full Code [id number]  Clance Boll, MD ED   01/26/2022 2006 02/04/2022 1500 Full Code [id number]  Clarnce Flock, MD Inpatient   11/26/2021 1623 11/29/2021 2154 Full Code [id number]  Ivor Costa, MD ED   10/23/2021 1800 11/24/2021 1619 Full Code [id number]  Herbert Pun, MD Inpatient   11/17/2020 0432 11/18/2020 1748 Full Code [id number]  Athena Masse, MD ED   11/05/2020 0604 11/10/2020 1631 Full Code [id number]  Benjamine Sprague, [place] [facility]   08/01/2020 1124 08/05/2020 2228 Full Code [id number]  Collier Bullock, MD ED   01/20/2020 0807 01/21/2020 2051 Full Code [id number]  Ivor Costa, MD ED   11/06/2019 1648 11/11/2019 1749 Full Code [id number]  Max Sane, MD ED   10/29/2019 1436 11/04/2019 1859 Full Code [id number]  Ivor Costa, MD ED   10/14/2019 0253  10/21/2019 1810 Full Code [id number]  Athena Masse, MD ED   09/27/2019 0211 10/02/2019 1736 Full Code [id number]  Athena Masse, MD ED   06/19/2019 1124 06/20/2019 1742 Full Code [id number]  Lang Snow, NP ED   12/22/2018 0844 12/24/2018 1727 Full Code [id number]  Harrie Foreman Inpatient   09/28/2018 0813 10/04/2018 1522 Full Code [id number]  Herbert Pun, MD Inpatient   09/05/2018 0616 09/10/2018 1708 Full Code [id number]  Arta Silence, MD ED   08/12/2018 0540 08/16/2018 2057 Full Code [id number]  Arta Silence, MD ED   07/21/2018 0332 07/23/2018 1718 Full Code [id number]  Derrek Monaco, RN Inpatient   07/18/2018 0640 07/21/2018 0330 DNR [id number]  Harrie Foreman Inpatient   07/05/2018 0808 07/09/2018 1946 DNR [id number]  Harrie Foreman Inpatient   06/30/2018 1733 07/02/2018 1507 DNR [id number]  [name], MD Inpatient   06/30/2018 0019 06/30/2018 1733 Full Code [id number]  Amelia Jo, MD Inpatient   06/04/2018 0113 06/05/2018 1448 Full Code [id number]  Amelia Jo, MD Inpatient   04/21/2018 0633 05/17/2018 2214 Full Code [id number]  Arta Silence, MD Inpatient   02/09/2017 1125 02/11/2017 1706 Full Code [id number]  Henreitta Leber, MD Inpatient      Care plan was discussed with RN  Thank you for allowing the Palliative Medicine Team to assist  in the care of this patient.   Asencion Gowda, NP  Please contact Palliative Medicine Team phone at [phone] for questions and concerns.

## 2022-08-14 NOTE — Progress Notes (Addendum)
0800: Pt experiencing 7/10 "crushing" chest pain; [place], MD aware. STAT EKG and Troponin ordered. Morphine given.

## 2022-08-14 NOTE — Assessment & Plan Note (Deleted)
Chest pain on 08/14/2022.  Call morphine given.  EKG reviewed by me shows atrial flutter 121 bpm, LVH, nonspecific ST-T wave changes.  Troponin with borderline elevation but flat.  Case discussed with cardiology.  Conservative management.

## 2022-08-15 DIAGNOSIS — R570 Cardiogenic shock: Secondary | ICD-10-CM | POA: Diagnosis not present

## 2022-08-15 DIAGNOSIS — I5043 Acute on chronic combined systolic (congestive) and diastolic (congestive) heart failure: Secondary | ICD-10-CM | POA: Diagnosis not present

## 2022-08-15 DIAGNOSIS — I1310 Hypertensive heart and chronic kidney disease without heart failure, with stage 1 through stage 4 chronic kidney disease, or unspecified chronic kidney disease: Secondary | ICD-10-CM | POA: Diagnosis not present

## 2022-08-15 LAB — BASIC METABOLIC PANEL
Anion gap: 15 (ref 5–15)
BUN: 118 mg/dL — ABNORMAL HIGH (ref 8–23)
CO2: 20 mmol/L — ABNORMAL LOW (ref 22–32)
Calcium: 9.5 mg/dL (ref 8.9–10.3)
Chloride: 95 mmol/L — ABNORMAL LOW (ref 98–111)
Creatinine, Ser: 5.21 mg/dL — ABNORMAL HIGH (ref 0.44–1.00)
GFR, Estimated: 8 mL/min — ABNORMAL LOW (ref >60)
Glucose, Bld: 93 mg/dL (ref 70–99)
Potassium: 3.9 mmol/L (ref 3.5–5.1)
Sodium: 130 mmol/L — ABNORMAL LOW (ref 135–145)

## 2022-08-15 NOTE — Progress Notes (Signed)
PROGRESS NOTE    Danielle [name]  WCH:[id number] DOB: 1955-11-12 DOA: 08/03/2022 PCP: [name], PA    Assessment & Plan:   Principal Problem:   Acute on chronic combined systolic and diastolic CHF (congestive heart failure) ([place]) Active Problems:   Acute on chronic respiratory failure with hypoxia (HCC)   Cardiogenic shock (HCC)   Pleural effusion on right   Chest pain   Cardiorenal syndrome with renal failure   Acute kidney injury superimposed on CKD (HCC)   Paroxysmal atrial fibrillation (HCC)   Protein-calorie malnutrition, severe (HCC)   Carcinoma of overlapping sites of left breast in female, estrogen receptor positive ([place])   Hyponatremia   Depression   Chronic obstructive pulmonary disease (HCC)   Hypomagnesemia   Anemia of chronic disease   Abdominal pain   HLD (hyperlipidemia)   Pleural effusion   Malignant neoplasm of upper lobe of right lung   Prolonged QT interval   Moderate aortic stenosis   Moderate tricuspid regurgitation by prior echocardiogram  Assessment and Plan:  Failure to thrive: secondary to all below. Poor prognosis. Pt has not agreed to comfort care only   Acute on chronic combined systolic and diastolic CHF: echo 3/53/61 -- EF 35-40%, grade II diastolic dysfunction, moderate MR, mod-severe TR, moderate AS. Continue on milrinone drip.  Overall prognosis is poor with cardiogenic shock.   Cardiogenic shock: continue on milrinone drip. Continue on midodrine    Acute on chronic hypoxic respiratory failure: continue on supplemental oxygen. Etiology multifactorial- pleural effusion, cardiorenal, CHF & cardiogenic shock   Chest pain: continue w/ medical management.  EKG reviewed by me shows atrial flutter 121 bpm, LVH, nonspecific ST-T wave changes as per Dr. Leslye Peer .  Troponin with borderline elevation but flat.  Case discussed with cardiology as per Dr. Leslye Peer    Pleural effusion on right: s/p thoracenteses done this year on 1/30, 2/8, 4/16,  5/12, 7/25, 8/8, 9/5. S/p thoracentesis on 10/5 with 900 cc fluid removed. Critical care team removed the right chest tube on 08/12/2022.  Critical care team does not recommend replacing chest tube. Leaking out of the previous chest tube site as per pt's nurse.    Cardiorenal syndrome: with renal failure. Pt does not want HD. Nephro signed off   PAF: continue on amio, eliquis. Unable to tolerate beta-blocker secondary to hypotension.   AKI on CKDIIIb: Cr is labile. Nephro signed    Severe protein-calorie malnutrition: continue on megace. Does not want a feeding tube. Encouraged to eat     Prolonged QT interval: continue on tele Continue to monitor on telemetry.   Lung cancer & esophageal cancer: s/p radiation of for esophageal & lung cancer. Management per onco outpatient    HLD: continue on statin    Abdominal pain: did not c/o abd pain today    ACD: H&H are labile. No need for transfusion currently    Hypomagnesemia: WNL    Severe COPD: w/ chronic respiratory failure. Continue on supplemental oxygen    Depression: severity unknown. Continue on home dose of celexa    Hyponatremia: likely secondary to hypervolemic state. Labile    Carcinoma of overlapping sites of left breast: estrogen receptor positive. Management per onco outpatient       DVT prophylaxis: eliquis  Code Status: DNR Family Communication: Disposition Plan: unclear. Poor prognosis   Status is: Inpatient Remains inpatient appropriate because: severity of illness    Level of care: Stepdown Consultants:  ICU Nephro  Cardio   Procedures:  Antimicrobials:   Subjective: Pt c/o shortness of breath   Objective: Vitals:   08/15/22 0400 08/15/22 0500 08/15/22 0600 08/15/22 0752  BP: 108/71  103/74   Pulse: (!) 126  (!) 138   Resp: 16  13   Temp: 98.1 F (36.7 C)     TempSrc: Oral     SpO2: 96%  94% 93%  Weight:  44.7 kg    Height:        Intake/Output Summary (Last 24 hours) at 08/15/2022  0934 Last data filed at 08/15/2022 0600 Gross per 24 hour  Intake 85.77 ml  Output --  Net 85.77 ml   Filed Weights   08/13/22 0439 08/14/22 0500 08/15/22 0500  Weight: 48.3 kg 45 kg 44.7 kg    Examination:  General exam: Appears  uncomfortable  Respiratory system: diminished breath sounds b/l  Cardiovascular system: S1 & S2 +. No rubs, gallops or clicks. Gastrointestinal system: Abdomen is nondistended, soft and nontender. Normal bowel sounds heard. Central nervous system: lethargic. Moves all extremities  Psychiatry: Judgement and insight appear normal. Flat mood and affect    Data Reviewed: I have personally reviewed following labs and imaging studies  CBC: Recent Labs  Lab 08/09/22 0431 08/12/22 0544 08/14/22 0432  WBC 7.2 10.4 7.8  HGB 9.4* 9.5* 9.0*  HCT 28.7* 28.6* 27.0*  MCV 95.0 91.1 90.3  PLT 236 308 932   Basic Metabolic Panel: Recent Labs  Lab 08/09/22 0431 08/10/22 0747 08/12/22 0544 08/14/22 0432 08/15/22 0518  NA 134* 132* 132* 132* 130*  K 4.4 4.7 4.7 4.2 3.9  CL 100 98 97* 96* 95*  CO2 23 22 22  21* 20*  GLUCOSE 105* 89 101* 107* 93  BUN 91* 95* 95* 91* 118*  CREATININE 4.88* 4.77* 4.19* 4.66* 5.21*  CALCIUM 8.8* 9.2 9.3 9.3 9.5  MG 2.5*  --  2.3  --   --   PHOS 6.0*  --   --   --   --    GFR: Estimated Creatinine Clearance: 7.3 mL/min (A) (by C-G formula based on SCr of 5.21 mg/dL (H)). Liver Function Tests: No results for input(s): "AST", "ALT", "ALKPHOS", "BILITOT", "PROT", "ALBUMIN" in the last 168 hours. No results for input(s): "LIPASE", "AMYLASE" in the last 168 hours. No results for input(s): "AMMONIA" in the last 168 hours. Coagulation Profile: No results for input(s): "INR", "PROTIME" in the last 168 hours. Cardiac Enzymes: No results for input(s): "CKTOTAL", "CKMB", "CKMBINDEX", "TROPONINI" in the last 168 hours. BNP (last 3 results) No results for input(s): "PROBNP" in the last 8760 hours. HbA1C: No results for input(s):  "HGBA1C" in the last 72 hours. CBG: No results for input(s): "GLUCAP" in the last 168 hours. Lipid Profile: No results for input(s): "CHOL", "HDL", "LDLCALC", "TRIG", "CHOLHDL", "LDLDIRECT" in the last 72 hours. Thyroid Function Tests: No results for input(s): "TSH", "T4TOTAL", "FREET4", "T3FREE", "THYROIDAB" in the last 72 hours. Anemia Panel: No results for input(s): "VITAMINB12", "FOLATE", "FERRITIN", "TIBC", "IRON", "RETICCTPCT" in the last 72 hours. Sepsis Labs: No results for input(s): "PROCALCITON", "LATICACIDVEN" in the last 168 hours.  Recent Results (from the past 240 hour(s))  Body fluid culture w Gram Stain     Status: None   Collection Time: 08/07/22  9:57 AM   Specimen: Pleura; Body Fluid  Result Value Ref Range Status   Specimen Description   Final    PLEURAL Performed at [facility], [address], [place], [place] [address]    Special Requests  Final    NONE Performed at [facility], [place], [place] [address]    Gram Stain NO WBC SEEN NO ORGANISMS SEEN   Final   Culture   Final    NO GROWTH 3 DAYS Performed at [facility], [place] [address], [place], [place] [address]    Report Status 08/11/2022 FINAL  Final         Radiology Studies: DG Chest Port 1 View  Result Date: 08/13/2022 CLINICAL DATA:  Pleural effusion EXAM: PORTABLE CHEST 1 VIEW COMPARISON:  Previous studies including the examination of 08/07/2022 FINDINGS: Transverse diameter of heart is increased. Central pulmonary vessels are prominent. There is interval removal of right chest tube. There is faint haziness in the right lower lung fields suggesting small residual right pleural effusion. Left lateral CP angle is clear. There is no pneumothorax. Linear densities in the apices may suggest scarring. IMPRESSION: Interval removal of right chest tube. There is haziness in the right lower lung fields suggesting small residual right pleural effusion. There is  no pneumothorax. There are no new focal infiltrates. Electronically Signed   By: Elmer Picker M.D.   On: 08/13/2022 09:49        Scheduled Meds:  amiodarone  200 mg Oral Daily   apixaban  2.5 mg Oral BID   atorvastatin  40 mg Oral Daily   Chlorhexidine Gluconate Cloth  6 each Topical Daily   citalopram  20 mg Oral Daily   feeding supplement  1 Container Oral TID BM   megestrol  200 mg Oral Daily   midodrine  10 mg Oral TID AC & HS   multivitamin with minerals  1 tablet Oral Daily   pantoprazole  40 mg Oral BID AC   polyethylene glycol  17 g Oral Daily   senna-docusate  2 tablet Oral BID   sodium chloride flush  3 mL Intravenous Q12H   torsemide  20 mg Oral Daily   Continuous Infusions:  sodium chloride Stopped (08/10/22 2209)   anticoagulant sodium citrate     milrinone 0.25 mcg/kg/min (08/15/22 0710)     LOS: 28 days    Time spent: 35 mins     Wyvonnia Dusky, MD [facility] Pager [phone]  If 7PM-7AM, please contact night-coverage [email or web address] 08/15/2022, 9:34 AM

## 2022-08-15 NOTE — Progress Notes (Addendum)
1115: Pt stated that she is having difficulty breathing; oxygen saturations at 83%. Upon assessment pt is lying on her side with the oxygen on top of her nose. Pt repositioned and sat up in the bed at 45 degrees. High flow nasal cannula positioned in her nostrils. Pt encouraged to take big deep breaths; oxygen increased to 91%. Prn Morphine offered but pt declined. Jimmye Norman, MD notified.

## 2022-08-15 DEATH — deceased

## 2022-08-16 DIAGNOSIS — I5043 Acute on chronic combined systolic (congestive) and diastolic (congestive) heart failure: Secondary | ICD-10-CM | POA: Diagnosis not present

## 2022-08-16 DIAGNOSIS — R627 Adult failure to thrive: Secondary | ICD-10-CM | POA: Diagnosis not present

## 2022-08-16 DIAGNOSIS — I1310 Hypertensive heart and chronic kidney disease without heart failure, with stage 1 through stage 4 chronic kidney disease, or unspecified chronic kidney disease: Secondary | ICD-10-CM | POA: Diagnosis not present

## 2022-08-16 LAB — BASIC METABOLIC PANEL
Anion gap: 15 (ref 5–15)
BUN: 122 mg/dL — ABNORMAL HIGH (ref 8–23)
CO2: 20 mmol/L — ABNORMAL LOW (ref 22–32)
Calcium: 9.1 mg/dL (ref 8.9–10.3)
Chloride: 95 mmol/L — ABNORMAL LOW (ref 98–111)
Creatinine, Ser: 4.7 mg/dL — ABNORMAL HIGH (ref 0.44–1.00)
GFR, Estimated: 10 mL/min — ABNORMAL LOW (ref >60)
Glucose, Bld: 115 mg/dL — ABNORMAL HIGH (ref 70–99)
Potassium: 4.2 mmol/L (ref 3.5–5.1)
Sodium: 130 mmol/L — ABNORMAL LOW (ref 135–145)

## 2022-08-16 LAB — CBC
HCT: 28 % — ABNORMAL LOW (ref 36.0–46.0)
Hemoglobin: 9.5 g/dL — ABNORMAL LOW (ref 12.0–15.0)
MCH: 30.4 pg (ref 26.0–34.0)
MCHC: 33.9 g/dL (ref 30.0–36.0)
MCV: 89.5 fL (ref 80.0–100.0)
Platelets: 346 10*3/uL (ref 150–400)
RBC: 3.13 MIL/uL — ABNORMAL LOW (ref 3.87–5.11)
RDW: 15.7 % — ABNORMAL HIGH (ref 11.5–15.5)
WBC: 8.7 10*3/uL (ref 4.0–10.5)
nRBC: 0.7 % — ABNORMAL HIGH (ref 0.0–0.2)

## 2022-08-16 NOTE — Progress Notes (Signed)
PROGRESS NOTE    Danielle [name]  HMC:[id number] DOB: 04-May-1956 DOA: 07/22/2022 PCP: [name], PA    Assessment & Plan:   Principal Problem:   Acute on chronic combined systolic and diastolic CHF (congestive heart failure) ([place]) Active Problems:   Acute on chronic respiratory failure with hypoxia (HCC)   Cardiogenic shock (HCC)   Pleural effusion on right   Chest pain   Cardiorenal syndrome with renal failure   Acute kidney injury superimposed on CKD (HCC)   Paroxysmal atrial fibrillation (HCC)   Protein-calorie malnutrition, severe (HCC)   Carcinoma of overlapping sites of left breast in female, estrogen receptor positive ([place])   Hyponatremia   Depression   Chronic obstructive pulmonary disease (HCC)   Hypomagnesemia   Anemia of chronic disease   Abdominal pain   HLD (hyperlipidemia)   Pleural effusion   Malignant neoplasm of upper lobe of right lung   Prolonged QT interval   Moderate aortic stenosis   Moderate tricuspid regurgitation by prior echocardiogram  Assessment and Plan:  Failure to thrive: secondary to all below. Poor prognosis. Pt does not want comfort care only currently    Acute on chronic combined systolic and diastolic CHF: echo 6/62/94 -- EF 35-40%, grade II diastolic dysfunction, moderate MR, mod-severe TR, moderate AS. D/c milrinone drip as per cardio Prognosis is poor. Pt does not want to comfort care only at this time    Cardiogenic shock: d/c milrinone drip as per cardio recs. Cardio signed off as no further recommendations from them    Acute on chronic hypoxic respiratory failure: continue on supplemental oxygen. Etiology multifactorial- pleural effusion, cardiorenal, CHF & cardiogenic shock   Chest pain: continue w/ medical management.  EKG reviewed by me shows atrial flutter 121 bpm, LVH, nonspecific ST-T wave changes as per Dr. Leslye Peer .  Troponin with borderline elevation but flat.  Case discussed with cardiology as per Dr. Leslye Peer     Pleural effusion on right: s/p thoracenteses done this year on 1/30, 2/8, 4/16, 5/12, 7/25, 8/8, 9/5. S/p thoracentesis on 10/5 with 900 cc fluid removed. Critical care team removed the right chest tube on 08/12/2022.  Critical care team does not recommend replacing chest tube. Leaking out of the previous chest tube site as per pt's nurse.    Cardiorenal syndrome: with renal failure. Pt does not want HD. Nephro signed off   PAF: continue on eliquis, amio.  Unable to tolerate beta-blocker secondary to hypotension.   AKI on CKDIIIb: Cr is labile. Nephro signed off as pt does not want HD    Severe protein-calorie malnutrition: continue on megace. Encouraged to eat. Pt does not want a feeding tube     Prolonged QT interval: continue on tele   Lung cancer & esophageal cancer: s/p radiation of for esophageal & lung cancer. Management per onco outpatient    HLD: continue on statn    Abdominal pain: did not c/o abd pain today    ACD: H&H are labile. Will transfuse if Hb < 7.0    Hypomagnesemia: WNL    Severe COPD: w/ chronic respiratory failure. Continue on supplemental oxygen     Depression: severity unknown. Continue on home dose of celexa    Hyponatremia: likely secondary to hypervolemic state. Labile    Carcinoma of overlapping sites of left breast: estrogen receptor positive. Management per onco outpatient       DVT prophylaxis: eliquis  Code Status: DNR Family Communication: called pt's daughter, no answer so I left a  voicemail  Disposition Plan: unclear. Poor prognosis   Status is: Inpatient Remains inpatient appropriate because: severity of illness    Level of care: Stepdown Consultants:  ICU Nephro  Cardio   Procedures:   Antimicrobials:   Subjective: Pt c/o shortness of breath and fatigue   Objective: Vitals:   08/16/22 0500 08/16/22 0600 08/16/22 0751 08/16/22 0811  BP:  91/61    Pulse:  (!) 128    Resp:  14    Temp:   97.7 F (36.5 C)   TempSrc:    Oral   SpO2:  100%  97%  Weight: 45.2 kg     Height:        Intake/Output Summary (Last 24 hours) at 08/16/2022 0830 Last data filed at 08/16/2022 0600 Gross per 24 hour  Intake 89.99 ml  Output --  Net 89.99 ml   Filed Weights   08/14/22 0500 08/15/22 0500 08/16/22 0500  Weight: 45 kg 44.7 kg 45.2 kg    Examination:  General exam: Appears uncomfortable  Respiratory system: decreased breath sounds b/l  Cardiovascular system: S1/S2+. No rubs or clicks  Gastrointestinal system: Abd is soft, NT, ND & hypoactive bowel sounds Central nervous system: lethargic. Moves all extremities  Psychiatry: judgement and insight appears at baseline. Flat mood and affect    Data Reviewed: I have personally reviewed following labs and imaging studies  CBC: Recent Labs  Lab 08/12/22 0544 08/14/22 0432 08/16/22 0448  WBC 10.4 7.8 8.7  HGB 9.5* 9.0* 9.5*  HCT 28.6* 27.0* 28.0*  MCV 91.1 90.3 89.5  PLT 308 340 573   Basic Metabolic Panel: Recent Labs  Lab 08/10/22 0747 08/12/22 0544 08/14/22 0432 08/15/22 0518 08/16/22 0448  NA 132* 132* 132* 130* 130*  K 4.7 4.7 4.2 3.9 4.2  CL 98 97* 96* 95* 95*  CO2 22 22 21* 20* 20*  GLUCOSE 89 101* 107* 93 115*  BUN 95* 95* 91* 118* 122*  CREATININE 4.77* 4.19* 4.66* 5.21* 4.70*  CALCIUM 9.2 9.3 9.3 9.5 9.1  MG  --  2.3  --   --   --    GFR: Estimated Creatinine Clearance: 8.2 mL/min (A) (by C-G formula based on SCr of 4.7 mg/dL (H)). Liver Function Tests: No results for input(s): "AST", "ALT", "ALKPHOS", "BILITOT", "PROT", "ALBUMIN" in the last 168 hours. No results for input(s): "LIPASE", "AMYLASE" in the last 168 hours. No results for input(s): "AMMONIA" in the last 168 hours. Coagulation Profile: No results for input(s): "INR", "PROTIME" in the last 168 hours. Cardiac Enzymes: No results for input(s): "CKTOTAL", "CKMB", "CKMBINDEX", "TROPONINI" in the last 168 hours. BNP (last 3 results) No results for input(s): "PROBNP" in  the last 8760 hours. HbA1C: No results for input(s): "HGBA1C" in the last 72 hours. CBG: No results for input(s): "GLUCAP" in the last 168 hours. Lipid Profile: No results for input(s): "CHOL", "HDL", "LDLCALC", "TRIG", "CHOLHDL", "LDLDIRECT" in the last 72 hours. Thyroid Function Tests: No results for input(s): "TSH", "T4TOTAL", "FREET4", "T3FREE", "THYROIDAB" in the last 72 hours. Anemia Panel: No results for input(s): "VITAMINB12", "FOLATE", "FERRITIN", "TIBC", "IRON", "RETICCTPCT" in the last 72 hours. Sepsis Labs: No results for input(s): "PROCALCITON", "LATICACIDVEN" in the last 168 hours.  Recent Results (from the past 240 hour(s))  Body fluid culture w Gram Stain     Status: None   Collection Time: 08/07/22  9:57 AM   Specimen: Pleura; Body Fluid  Result Value Ref Range Status   Specimen Description   Final  PLEURAL Performed at [facility], [address] [place], [place] [address]    Special Requests   Final    NONE Performed at [facility], [place], [place] [address]    Gram Stain NO WBC SEEN NO ORGANISMS SEEN   Final   Culture   Final    NO GROWTH 3 DAYS Performed at [facility], [place] [address] [place], [place] [address]    Report Status 08/11/2022 FINAL  Final         Radiology Studies: No results found.      Scheduled Meds:  amiodarone  200 mg Oral Daily   apixaban  2.5 mg Oral BID   atorvastatin  40 mg Oral Daily   Chlorhexidine Gluconate Cloth  6 each Topical Daily   citalopram  20 mg Oral Daily   feeding supplement  1 Container Oral TID BM   megestrol  200 mg Oral Daily   midodrine  10 mg Oral TID AC & HS   multivitamin with minerals  1 tablet Oral Daily   pantoprazole  40 mg Oral BID AC   polyethylene glycol  17 g Oral Daily   senna-docusate  2 tablet Oral BID   sodium chloride flush  3 mL Intravenous Q12H   torsemide  20 mg Oral Daily   Continuous Infusions:  sodium chloride Stopped  (08/10/22 2209)   anticoagulant sodium citrate     milrinone 0.25 mcg/kg/min (08/16/22 0657)     LOS: 29 days    Time spent: 25 mins     Wyvonnia Dusky, MD [facility] Pager [phone]  If 7PM-7AM, please contact night-coverage [email or web address] 08/16/2022, 8:30 AM

## 2022-08-16 NOTE — Plan of Care (Signed)
Spoke with attending. Discussed status and poor prognosis with anticipated hospital death.  Discussed patient's desires for both life prolonging care including high flow cannula, in addition to decisions to not prolong life including no dialysis or feeding tube. Patient has stated she wants to do what she can to live as long as she can outside of pressors, dialysis, feeding tube, DNR and DNI status. Attending has spoken with patient and confirmed the same wishes. Attending team will reach out to palliative as needed.

## 2022-08-16 NOTE — Significant Event (Signed)
Dr. Clayborn Bigness was curbsided by the intensivist, Dr. Mortimer Fries, who asked Dr. Clayborn Bigness what to do about this patient's continuous milrinone infusion. Dr. Clayborn Bigness did not order/initiate milrinone (initially ordered by Dr. Roosevelt Locks). He does not recommend continuing it for this patient with her ongoing multiorgan failure. He has no further recommendations and no further cardiac diagnostics are necessary.   Tristan Schroeder, PA-C  [facility]

## 2022-08-17 DIAGNOSIS — I1310 Hypertensive heart and chronic kidney disease without heart failure, with stage 1 through stage 4 chronic kidney disease, or unspecified chronic kidney disease: Secondary | ICD-10-CM | POA: Diagnosis not present

## 2022-08-17 DIAGNOSIS — R627 Adult failure to thrive: Secondary | ICD-10-CM | POA: Diagnosis not present

## 2022-08-17 DIAGNOSIS — I5043 Acute on chronic combined systolic (congestive) and diastolic (congestive) heart failure: Secondary | ICD-10-CM | POA: Diagnosis not present

## 2022-08-17 LAB — CBC
HCT: 27.5 % — ABNORMAL LOW (ref 36.0–46.0)
Hemoglobin: 9.4 g/dL — ABNORMAL LOW (ref 12.0–15.0)
MCH: 30.1 pg (ref 26.0–34.0)
MCHC: 34.2 g/dL (ref 30.0–36.0)
MCV: 88.1 fL (ref 80.0–100.0)
Platelets: 337 10*3/uL (ref 150–400)
RBC: 3.12 MIL/uL — ABNORMAL LOW (ref 3.87–5.11)
RDW: 15.9 % — ABNORMAL HIGH (ref 11.5–15.5)
WBC: 8.7 10*3/uL (ref 4.0–10.5)
nRBC: 0.9 % — ABNORMAL HIGH (ref 0.0–0.2)

## 2022-08-17 NOTE — Progress Notes (Signed)
PROGRESS NOTE    Danielle [name]  OZH:[id number] DOB: [birth year] DOA: 08/02/2022 PCP: [name], PA    Assessment & Plan:   Principal Problem:   Acute on chronic combined systolic and diastolic CHF (congestive heart failure) ([place]) Active Problems:   Acute on chronic respiratory failure with hypoxia (HCC)   Cardiogenic shock (HCC)   Pleural effusion on right   Chest pain   Cardiorenal syndrome with renal failure   Acute kidney injury superimposed on CKD (HCC)   Paroxysmal atrial fibrillation (HCC)   Protein-calorie malnutrition, severe (HCC)   Carcinoma of overlapping sites of left breast in female, estrogen receptor positive ([place])   Hyponatremia   Depression   Chronic obstructive pulmonary disease (HCC)   Hypomagnesemia   Anemia of chronic disease   Abdominal pain   HLD (hyperlipidemia)   Pleural effusion   Malignant neoplasm of upper lobe of right lung   Prolonged QT interval   Moderate aortic stenosis   Moderate tricuspid regurgitation by prior echocardiogram  Assessment and Plan:  Failure to thrive: secondary to all below. Poor prognosis. High risk for further decline. Pt does not want comfort care  Acute on chronic combined systolic and diastolic CHF: echo 04/13/51 -- EF 35-40%, grade II diastolic dysfunction, moderate MR, mod-severe TR, moderate AS. Milrinone drip was d/c on 08/16/22 as per cardio. Poor prognosis. Pt does not want comfort care currently.    Cardiogenic shock: milrinone drip was d/c on 08/16/22 as per cardio recs. Cardio signed off as they have no further recommendations    Acute on chronic hypoxic respiratory failure: continue on supplemental oxygen. Unchanged from day prior. High risk for further decline.  Etiology multifactorial- pleural effusion, cardiorenal, CHF & cardiogenic shock   Chest pain: continue w/ medical management.  EKG reviewed by me shows atrial flutter 121 bpm, LVH, nonspecific ST-T wave changes as per Dr. Leslye Peer .  Troponin with  borderline elevation but flat.  Case discussed with cardiology as per Dr. Leslye Peer    Pleural effusion on right: s/p thoracenteses done this year on 1/30, 2/8, 4/16, 5/12, 7/25, 8/8, 9/5. S/p thoracentesis on 10/5 with 900 cc fluid removed. Critical care team removed the right chest tube on 08/12/2022.  Critical care team does not recommend replacing chest tube. Leaking out of the previous chest tube site as per pt's nurse.    Cardiorenal syndrome: w/ renal failure. Pt does not want HD. Nephro signed off    PAF: continue on eliquis, amio. Unable to tolerate BB secondary to hypotension   AKI on CKDIIIb: Cr is labile. Nephro signed off as pt does not want HD    Severe protein-calorie malnutrition: continue on megace. Encouraged to eat. Pt does not want a feeding tube     Prolonged QT interval: continue on tele   Lung cancer & esophageal cancer: s/p radiation of for esophageal & lung cancer. Management per onco outpatient    HLD: continue on statin    Abdominal pain: did not c/o abd pain today    ACD: H&H are labile. No need for a transfusion currently    Hypomagnesemia: WNL    Severe COPD: w/ chronic respiratory failure. Continue on supplemental oxygen    Depression: severity unknown. Continue on home dose of celexa    Hyponatremia: likely secondary to hypervolemic state. Labile. Will continue to monitor     Carcinoma of overlapping sites of left breast: estrogen receptor positive. Management per onco outpatient       DVT prophylaxis:  eliquis  Code Status: DNR Family Communication: discussed pt's care w/ pt's daughter, [name], and answered her questions  Disposition Plan: unclear. Poor prognosis   Status is: Inpatient Remains inpatient appropriate because: severity of illness    Level of care: Stepdown Consultants:  ICU Nephro  Cardio   Procedures:   Antimicrobials:   Subjective: Pt c/o shortness of breath   Objective: Vitals:   08/17/22 0400 08/17/22 0500  08/17/22 0600 08/17/22 0801  BP: (!) 89/62 (!) 84/58 (!) 87/65   Pulse: (!) 124 (!) 119 (!) 125   Resp: 17 19 17    Temp:      TempSrc:      SpO2: 96% 94% 92% 92%  Weight:  45 kg    Height:        Intake/Output Summary (Last 24 hours) at 08/17/2022 0829 Last data filed at 08/17/2022 0730 Gross per 24 hour  Intake 37.72 ml  Output 475 ml  Net -437.28 ml   Filed Weights   08/15/22 0500 08/16/22 0500 08/17/22 0500  Weight: 44.7 kg 45.2 kg 45 kg    Examination:  General exam: Appears uncomfortable.  Respiratory system: diminished breath sounds b/l   Cardiovascular system: S1 & S2+. No rubs or clicks  Gastrointestinal system: Abd is soft, NT, ND & hypoactive bowel sounds  Central nervous system: Lethargic. Moves all extremities  Psychiatry: judgement and insight appears at baseline. Flat mood and affect     Data Reviewed: I have personally reviewed following labs and imaging studies  CBC: Recent Labs  Lab 08/12/22 0544 08/14/22 0432 08/16/22 0448 08/17/22 0610  WBC 10.4 7.8 8.7 8.7  HGB 9.5* 9.0* 9.5* 9.4*  HCT 28.6* 27.0* 28.0* 27.5*  MCV 91.1 90.3 89.5 88.1  PLT 308 340 346 161   Basic Metabolic Panel: Recent Labs  Lab 08/12/22 0544 08/14/22 0432 08/15/22 0518 08/16/22 0448  NA 132* 132* 130* 130*  K 4.7 4.2 3.9 4.2  CL 97* 96* 95* 95*  CO2 22 21* 20* 20*  GLUCOSE 101* 107* 93 115*  BUN 95* 91* 118* 122*  CREATININE 4.19* 4.66* 5.21* 4.70*  CALCIUM 9.3 9.3 9.5 9.1  MG 2.3  --   --   --    GFR: Estimated Creatinine Clearance: 8.1 mL/min (A) (by C-G formula based on SCr of 4.7 mg/dL (H)). Liver Function Tests: No results for input(s): "AST", "ALT", "ALKPHOS", "BILITOT", "PROT", "ALBUMIN" in the last 168 hours. No results for input(s): "LIPASE", "AMYLASE" in the last 168 hours. No results for input(s): "AMMONIA" in the last 168 hours. Coagulation Profile: No results for input(s): "INR", "PROTIME" in the last 168 hours. Cardiac Enzymes: No results for  input(s): "CKTOTAL", "CKMB", "CKMBINDEX", "TROPONINI" in the last 168 hours. BNP (last 3 results) No results for input(s): "PROBNP" in the last 8760 hours. HbA1C: No results for input(s): "HGBA1C" in the last 72 hours. CBG: No results for input(s): "GLUCAP" in the last 168 hours. Lipid Profile: No results for input(s): "CHOL", "HDL", "LDLCALC", "TRIG", "CHOLHDL", "LDLDIRECT" in the last 72 hours. Thyroid Function Tests: No results for input(s): "TSH", "T4TOTAL", "FREET4", "T3FREE", "THYROIDAB" in the last 72 hours. Anemia Panel: No results for input(s): "VITAMINB12", "FOLATE", "FERRITIN", "TIBC", "IRON", "RETICCTPCT" in the last 72 hours. Sepsis Labs: No results for input(s): "PROCALCITON", "LATICACIDVEN" in the last 168 hours.  Recent Results (from the past 240 hour(s))  Body fluid culture w Gram Stain     Status: None   Collection Time: 08/07/22  9:57 AM   Specimen:  Pleura; Body Fluid  Result Value Ref Range Status   Specimen Description   Final    PLEURAL Performed at [facility], [address], [place], [place] [address]    Special Requests   Final    NONE Performed at [facility], [place], [place] [address]    Gram Stain NO WBC SEEN NO ORGANISMS SEEN   Final   Culture   Final    NO GROWTH 3 DAYS Performed at [facility], [place] [address] [place], [place] [address]    Report Status 08/11/2022 FINAL  Final         Radiology Studies: No results found.      Scheduled Meds:  amiodarone  200 mg Oral Daily   apixaban  2.5 mg Oral BID   atorvastatin  40 mg Oral Daily   Chlorhexidine Gluconate Cloth  6 each Topical Daily   citalopram  20 mg Oral Daily   feeding supplement  1 Container Oral TID BM   megestrol  200 mg Oral Daily   midodrine  10 mg Oral TID AC & HS   multivitamin with minerals  1 tablet Oral Daily   pantoprazole  40 mg Oral BID AC   polyethylene glycol  17 g Oral Daily   senna-docusate  2 tablet Oral  BID   sodium chloride flush  3 mL Intravenous Q12H   torsemide  20 mg Oral Daily   Continuous Infusions:  sodium chloride Stopped (08/10/22 2209)   anticoagulant sodium citrate       LOS: 30 days    Time spent: 26 mins     Wyvonnia Dusky, MD [facility] Pager [phone]  If 7PM-7AM, please contact night-coverage [email or web address] 08/17/2022, 8:29 AM

## 2022-08-18 DIAGNOSIS — I5043 Acute on chronic combined systolic (congestive) and diastolic (congestive) heart failure: Secondary | ICD-10-CM | POA: Diagnosis not present

## 2022-08-18 DIAGNOSIS — R627 Adult failure to thrive: Secondary | ICD-10-CM | POA: Diagnosis not present

## 2022-08-18 DIAGNOSIS — I1310 Hypertensive heart and chronic kidney disease without heart failure, with stage 1 through stage 4 chronic kidney disease, or unspecified chronic kidney disease: Secondary | ICD-10-CM | POA: Diagnosis not present

## 2022-08-18 LAB — CBC
HCT: 29.4 % — ABNORMAL LOW (ref 36.0–46.0)
Hemoglobin: 9.8 g/dL — ABNORMAL LOW (ref 12.0–15.0)
MCH: 29.8 pg (ref 26.0–34.0)
MCHC: 33.3 g/dL (ref 30.0–36.0)
MCV: 89.4 fL (ref 80.0–100.0)
Platelets: 379 10*3/uL (ref 150–400)
RBC: 3.29 MIL/uL — ABNORMAL LOW (ref 3.87–5.11)
RDW: 16.4 % — ABNORMAL HIGH (ref 11.5–15.5)
WBC: 8.2 10*3/uL (ref 4.0–10.5)
nRBC: 1 % — ABNORMAL HIGH (ref 0.0–0.2)

## 2022-08-18 LAB — BASIC METABOLIC PANEL
Anion gap: 14 (ref 5–15)
Anion gap: 15 (ref 5–15)
BUN: 116 mg/dL — ABNORMAL HIGH (ref 8–23)
BUN: 119 mg/dL — ABNORMAL HIGH (ref 8–23)
CO2: 20 mmol/L — ABNORMAL LOW (ref 22–32)
CO2: 19 mmol/L — ABNORMAL LOW (ref 22–32)
Calcium: 9.4 mg/dL (ref 8.9–10.3)
Calcium: 9.4 mg/dL (ref 8.9–10.3)
Chloride: 96 mmol/L — ABNORMAL LOW (ref 98–111)
Chloride: 95 mmol/L — ABNORMAL LOW (ref 98–111)
Creatinine, Ser: 4.63 mg/dL — ABNORMAL HIGH (ref 0.44–1.00)
Creatinine, Ser: 4.66 mg/dL — ABNORMAL HIGH (ref 0.44–1.00)
GFR, Estimated: 10 mL/min — ABNORMAL LOW (ref >60)
GFR, Estimated: 10 mL/min — ABNORMAL LOW (ref >60)
Glucose, Bld: 86 mg/dL (ref 70–99)
Glucose, Bld: 84 mg/dL (ref 70–99)
Potassium: 4.4 mmol/L (ref 3.5–5.1)
Potassium: 4.5 mmol/L (ref 3.5–5.1)
Sodium: 130 mmol/L — ABNORMAL LOW (ref 135–145)
Sodium: 129 mmol/L — ABNORMAL LOW (ref 135–145)

## 2022-08-18 MED ORDER — LOPERAMIDE HCL 2 MG PO CAPS
2.0000 mg | ORAL_CAPSULE | Freq: Two times a day (BID) | ORAL | Status: DC | PRN
  Administered 2022-08-18: 2 mg via ORAL
  Filled 2022-08-18: qty 1

## 2022-08-18 NOTE — Progress Notes (Signed)
PROGRESS NOTE    Danielle [name]  STM:[id number] DOB: April 16, 1956 DOA: 08/11/2022 PCP: [name], PA    Assessment & Plan:   Principal Problem:   Acute on chronic combined systolic and diastolic CHF (congestive heart failure) ([place]) Active Problems:   Acute on chronic respiratory failure with hypoxia (HCC)   Cardiogenic shock (HCC)   Pleural effusion on right   Chest pain   Cardiorenal syndrome with renal failure   Acute kidney injury superimposed on CKD (HCC)   Paroxysmal atrial fibrillation (HCC)   Protein-calorie malnutrition, severe (HCC)   Carcinoma of overlapping sites of left breast in female, estrogen receptor positive ([place])   Hyponatremia   Depression   Chronic obstructive pulmonary disease (HCC)   Hypomagnesemia   Anemia of chronic disease   Abdominal pain   HLD (hyperlipidemia)   Pleural effusion   Malignant neoplasm of upper lobe of right lung   Prolonged QT interval   Moderate aortic stenosis   Moderate tricuspid regurgitation by prior echocardiogram  Assessment and Plan:  Failure to thrive: secondary to all below. Poor prognosis. High risk for further decline. Pt does not want comfort care currently   Acute on chronic combined systolic and diastolic CHF: echo 8/92/11 -- EF 35-40%, grade II diastolic dysfunction, moderate MR, mod-severe TR, moderate AS. Milrinone drip was d/c on 08/16/22 as per cardio. Poor prognosis. Pt does not want comfort care currently.    Cardiogenic shock: milrinone drip was d/c on 08/16/22 as per cardio recs. Cardio signed off as they have no further recommendations. Continue on midodrine    Acute on chronic hypoxic respiratory failure: continue on supplemental oxygen, currently on HFNC. Has made no improvements in regards to respiratory status. High risk for further decline.   Etiology multifactorial- pleural effusion, cardiorenal, CHF & cardiogenic shock   Chest pain: continue w/ medical management. Troponin with borderline  elevation but flat.  Case discussed with cardiology as per Dr. Leslye Peer    Pleural effusion on right: s/p thoracenteses done this year on 1/30, 2/8, 4/16, 5/12, 7/25, 8/8, 9/5. S/p thoracentesis on 10/5 with 900 cc fluid removed. Critical care team removed the right chest tube on 08/12/2022.  Critical care team does not recommend replacing chest tube. Leaking out of the previous chest tube site as per pt's nurse.    Cardiorenal syndrome: w/ renal failure. Pt does not want HD. Nephro signed off    PAF: continue on eliquis, amio. Unable to tolerate BB secondary to hypotension    AKI on CKDIIIb: Cr is labile. Pt does not want HD so nephro signed off    Severe protein-calorie malnutrition: encouraged to eat. Continue on megace. Pt does not want a feeding tube     Prolonged QT interval: continue on tele   Lung cancer & esophageal cancer: s/p radiation of for esophageal & lung cancer. Management per onco outpatient    HLD: continue on statin    Abdominal pain: resolved   ACD: H&H are labile. Will transfuse if Hb < 7.0    Hypomagnesemia: WNL    Severe COPD: w/ chronic respiratory failure. Continue on supplemental oxygen    Depression: severity unknown. Continue on home dose of celexa    Hyponatremia: likely secondary to hypervolemic state. Labile    Carcinoma of overlapping sites of left breast: estrogen receptor positive. Management per onco as an outpatient       DVT prophylaxis: eliquis  Code Status: DNR Family Communication:  Disposition Plan: unclear. Poor prognosis  Status is: Inpatient Remains inpatient appropriate because: severity of illness    Level of care: Progressive Consultants:  ICU Nephro  Cardio   Procedures:   Antimicrobials:   Subjective: Pt still c/o shortness of breath  Objective: Vitals:   08/18/22 0500 08/18/22 0600 08/18/22 0700 08/18/22 0808  BP: 92/75 93/75 (!) 76/61   Pulse: (!) 110 (!) 124 (!) 108   Resp: (!) 21 19 14    Temp:       TempSrc:      SpO2: 98% 98% 92% 91%  Weight:      Height:        Intake/Output Summary (Last 24 hours) at 08/18/2022 0825 Last data filed at 08/18/2022 0500 Gross per 24 hour  Intake 680 ml  Output 75 ml  Net 605 ml   Filed Weights   08/15/22 0500 08/16/22 0500 08/17/22 0500  Weight: 44.7 kg 45.2 kg 45 kg    Examination:  General exam: Appears calm but uncomfortable   Respiratory system: decreased breath sounds b/l  Cardiovascular system: S1&S2+. No rubs or gallops  Gastrointestinal system: Abd is soft, NT, ND & hyperactive bowel sounds Central nervous system: Alert and awake. Moves all extremities  Psychiatry: judgement and insight appears at baseline. Flat mood and affect    Data Reviewed: I have personally reviewed following labs and imaging studies  CBC: Recent Labs  Lab 08/12/22 0544 08/14/22 0432 08/16/22 0448 08/17/22 0610 08/18/22 0437  WBC 10.4 7.8 8.7 8.7 8.2  HGB 9.5* 9.0* 9.5* 9.4* 9.8*  HCT 28.6* 27.0* 28.0* 27.5* 29.4*  MCV 91.1 90.3 89.5 88.1 89.4  PLT 308 340 346 337 798   Basic Metabolic Panel: Recent Labs  Lab 08/12/22 0544 08/14/22 0432 08/15/22 0518 08/16/22 0448 08/17/22 0610 08/18/22 0437  NA 132* 132* 130* 130* 130* 129*  K 4.7 4.2 3.9 4.2 4.4 4.5  CL 97* 96* 95* 95* 96* 95*  CO2 22 21* 20* 20* 20* 19*  GLUCOSE 101* 107* 93 115* 86 84  BUN 95* 91* 118* 122* 116* 119*  CREATININE 4.19* 4.66* 5.21* 4.70* 4.63* 4.66*  CALCIUM 9.3 9.3 9.5 9.1 9.4 9.4  MG 2.3  --   --   --   --   --    GFR: Estimated Creatinine Clearance: 8.2 mL/min (A) (by C-G formula based on SCr of 4.66 mg/dL (H)). Liver Function Tests: No results for input(s): "AST", "ALT", "ALKPHOS", "BILITOT", "PROT", "ALBUMIN" in the last 168 hours. No results for input(s): "LIPASE", "AMYLASE" in the last 168 hours. No results for input(s): "AMMONIA" in the last 168 hours. Coagulation Profile: No results for input(s): "INR", "PROTIME" in the last 168 hours. Cardiac  Enzymes: No results for input(s): "CKTOTAL", "CKMB", "CKMBINDEX", "TROPONINI" in the last 168 hours. BNP (last 3 results) No results for input(s): "PROBNP" in the last 8760 hours. HbA1C: No results for input(s): "HGBA1C" in the last 72 hours. CBG: No results for input(s): "GLUCAP" in the last 168 hours. Lipid Profile: No results for input(s): "CHOL", "HDL", "LDLCALC", "TRIG", "CHOLHDL", "LDLDIRECT" in the last 72 hours. Thyroid Function Tests: No results for input(s): "TSH", "T4TOTAL", "FREET4", "T3FREE", "THYROIDAB" in the last 72 hours. Anemia Panel: No results for input(s): "VITAMINB12", "FOLATE", "FERRITIN", "TIBC", "IRON", "RETICCTPCT" in the last 72 hours. Sepsis Labs: No results for input(s): "PROCALCITON", "LATICACIDVEN" in the last 168 hours.  No results found for this or any previous visit (from the past 240 hour(s)).        Radiology Studies: No results found.  Scheduled Meds:  amiodarone  200 mg Oral Daily   apixaban  2.5 mg Oral BID   atorvastatin  40 mg Oral Daily   Chlorhexidine Gluconate Cloth  6 each Topical Daily   citalopram  20 mg Oral Daily   feeding supplement  1 Container Oral TID BM   megestrol  200 mg Oral Daily   midodrine  10 mg Oral TID AC & HS   multivitamin with minerals  1 tablet Oral Daily   pantoprazole  40 mg Oral BID AC   polyethylene glycol  17 g Oral Daily   senna-docusate  2 tablet Oral BID   sodium chloride flush  3 mL Intravenous Q12H   torsemide  20 mg Oral Daily   Continuous Infusions:  sodium chloride Stopped (08/10/22 2209)   anticoagulant sodium citrate       LOS: 31 days    Time spent: 25 mins     Wyvonnia Dusky, MD [facility] Pager [phone]  If 7PM-7AM, please contact night-coverage [email or web address] 08/18/2022, 8:25 AM

## 2022-08-19 DIAGNOSIS — I5043 Acute on chronic combined systolic (congestive) and diastolic (congestive) heart failure: Secondary | ICD-10-CM | POA: Diagnosis not present

## 2022-08-19 DIAGNOSIS — R627 Adult failure to thrive: Secondary | ICD-10-CM | POA: Diagnosis not present

## 2022-08-19 DIAGNOSIS — I1310 Hypertensive heart and chronic kidney disease without heart failure, with stage 1 through stage 4 chronic kidney disease, or unspecified chronic kidney disease: Secondary | ICD-10-CM | POA: Diagnosis not present

## 2022-08-19 LAB — BASIC METABOLIC PANEL
Anion gap: 15 (ref 5–15)
BUN: 122 mg/dL — ABNORMAL HIGH (ref 8–23)
CO2: 18 mmol/L — ABNORMAL LOW (ref 22–32)
Calcium: 9 mg/dL (ref 8.9–10.3)
Chloride: 97 mmol/L — ABNORMAL LOW (ref 98–111)
Creatinine, Ser: 5.43 mg/dL — ABNORMAL HIGH (ref 0.44–1.00)
GFR, Estimated: 8 mL/min — ABNORMAL LOW (ref >60)
Glucose, Bld: 106 mg/dL — ABNORMAL HIGH (ref 70–99)
Potassium: 4.1 mmol/L (ref 3.5–5.1)
Sodium: 130 mmol/L — ABNORMAL LOW (ref 135–145)

## 2022-08-19 LAB — CBC
HCT: 27 % — ABNORMAL LOW (ref 36.0–46.0)
Hemoglobin: 9.2 g/dL — ABNORMAL LOW (ref 12.0–15.0)
MCH: 30.3 pg (ref 26.0–34.0)
MCHC: 34.1 g/dL (ref 30.0–36.0)
MCV: 88.8 fL (ref 80.0–100.0)
Platelets: 356 10*3/uL (ref 150–400)
RBC: 3.04 MIL/uL — ABNORMAL LOW (ref 3.87–5.11)
RDW: 16.7 % — ABNORMAL HIGH (ref 11.5–15.5)
WBC: 7.9 10*3/uL (ref 4.0–10.5)
nRBC: 0.8 % — ABNORMAL HIGH (ref 0.0–0.2)

## 2022-08-19 MED ORDER — PHENOL 1.4 % MT LIQD
1.0000 | OROMUCOSAL | Status: DC | PRN
  Filled 2022-08-19: qty 177

## 2022-08-19 NOTE — Plan of Care (Signed)

## 2022-08-19 NOTE — Progress Notes (Signed)
PROGRESS NOTE    Danielle [name]  OEU:[id number] DOB: 1956-08-22 DOA: 07/17/2022 PCP: [name], PA    Assessment & Plan:   Principal Problem:   Acute on chronic combined systolic and diastolic CHF (congestive heart failure) ([place]) Active Problems:   Acute on chronic respiratory failure with hypoxia (HCC)   Cardiogenic shock (HCC)   Pleural effusion on right   Chest pain   Cardiorenal syndrome with renal failure   Acute kidney injury superimposed on CKD (HCC)   Paroxysmal atrial fibrillation (HCC)   Protein-calorie malnutrition, severe (HCC)   Carcinoma of overlapping sites of left breast in female, estrogen receptor positive ([place])   Hyponatremia   Depression   Chronic obstructive pulmonary disease (HCC)   Hypomagnesemia   Anemia of chronic disease   Abdominal pain   HLD (hyperlipidemia)   Pleural effusion   Malignant neoplasm of upper lobe of right lung   Prolonged QT interval   Moderate aortic stenosis   Moderate tricuspid regurgitation by prior echocardiogram  Assessment and Plan:  Failure to thrive: secondary to all below. Poor prognosis. High risk for further decline. Pt does not want comfort care currently   Acute on chronic combined systolic and diastolic CHF: echo 1/54/00 -- EF 35-40%, grade II diastolic dysfunction, moderate MR, mod-severe TR, moderate AS. Milrinone drip was d/c on 08/16/22 as per cardio. Poor prognosis. Pt does not want comfort care currently    Cardiogenic shock: milrinone drip was d/c on 08/16/22 as per cardio recs. Cardio signed off as they have no further recommendations. Continue on midodrine, keep MAP >65   Acute on chronic hypoxic respiratory failure: continue on supplemental oxygen, currently on HFNC. Has made no improvements in regards to respiratory status. High risk for further decline.   Etiology multifactorial- pleural effusion, cardiorenal, CHF & cardiogenic shock   Chest pain: continue w/ medical management. Troponin with  borderline elevation but flat.  Case discussed with cardiology as per Dr. Leslye Peer. No c/o chest pain today.    Pleural effusion on right: s/p thoracenteses done this year on 1/30, 2/8, 4/16, 5/12, 7/25, 8/8, 9/5. S/p thoracentesis on 10/5 with 900 cc fluid removed. Critical care team removed the right chest tube on 08/12/2022.  Critical care team does not recommend replacing chest tube. Leaking out of the previous chest tube site as per pt's nurse.    Cardiorenal syndrome: w/ renal failure. Pt does not want HD. Nephro signed off    PAF: continue on eliquis, amio. Unable to tolerate BB secondary to hypotension    AKI on CKDIIIb: Cr is trending up today. Pt does not want HD so nephro signed off    Severe protein-calorie malnutrition: encouraged to eat. Continue on megace. Pt does not want a feeding tube     Prolonged QT interval: continue on tele   Lung cancer & esophageal cancer: s/p radiation of for esophageal & lung cancer. Management per onco outpatient    HLD: continue on statin    Abdominal pain: resolved   ACD: H&H are labile. No need for a transfusion currently    Hypomagnesemia: WNL    Severe COPD: w/ chronic respiratory failure. Continue on supplemental oxygen    Depression: severity unknown. Continue on home dose of celexa    Hyponatremia: likely secondary to hypervolemic state. Labile    Carcinoma of overlapping sites of left breast: estrogen receptor positive. Management per onco as an outpatient       DVT prophylaxis: eliquis  Code Status: DNR Family  Communication:  Disposition Plan: unclear. Poor prognosis   Status is: Inpatient Remains inpatient appropriate because: severity of illness, poor prognosis     Level of care: Progressive Consultants:  ICU Nephro  Cardio   Procedures:   Antimicrobials:   Subjective: Pt c/o shortness of breath still   Objective: Vitals:   08/19/22 0600 08/19/22 0700 08/19/22 0800 08/19/22 0805  BP:   93/74    Pulse: 85 85 77   Resp: (!) 21 13 12    Temp:      TempSrc:      SpO2: 91% 93% 93% 93%  Weight:      Height:        Intake/Output Summary (Last 24 hours) at 08/19/2022 0811 Last data filed at 08/18/2022 1712 Gross per 24 hour  Intake 240 ml  Output 125 ml  Net 115 ml   Filed Weights   08/15/22 0500 08/16/22 0500 08/17/22 0500  Weight: 44.7 kg 45.2 kg 45 kg    Examination:  General exam: Appears uncomfortable. Frail appearing   Respiratory system: diminished breath sounds b/l  Cardiovascular system: S1 & S2+. No rubs or gallops  Gastrointestinal system: Abd is soft, NT, ND & hypoactive bowel sounds  Central nervous system: alert and awake. Moves all extremities   Psychiatry: judgement and insight appears at baseline. Flat mood and affect     Data Reviewed: I have personally reviewed following labs and imaging studies  CBC: Recent Labs  Lab 08/14/22 0432 08/16/22 0448 08/17/22 0610 08/18/22 0437 08/19/22 0456  WBC 7.8 8.7 8.7 8.2 7.9  HGB 9.0* 9.5* 9.4* 9.8* 9.2*  HCT 27.0* 28.0* 27.5* 29.4* 27.0*  MCV 90.3 89.5 88.1 89.4 88.8  PLT 340 346 337 379 627   Basic Metabolic Panel: Recent Labs  Lab 08/15/22 0518 08/16/22 0448 08/17/22 0610 08/18/22 0437 08/19/22 0456  NA 130* 130* 130* 129* 130*  K 3.9 4.2 4.4 4.5 4.1  CL 95* 95* 96* 95* 97*  CO2 20* 20* 20* 19* 18*  GLUCOSE 93 115* 86 84 106*  BUN 118* 122* 116* 119* 122*  CREATININE 5.21* 4.70* 4.63* 4.66* 5.43*  CALCIUM 9.5 9.1 9.4 9.4 9.0   GFR: Estimated Creatinine Clearance: 7 mL/min (A) (by C-G formula based on SCr of 5.43 mg/dL (H)). Liver Function Tests: No results for input(s): "AST", "ALT", "ALKPHOS", "BILITOT", "PROT", "ALBUMIN" in the last 168 hours. No results for input(s): "LIPASE", "AMYLASE" in the last 168 hours. No results for input(s): "AMMONIA" in the last 168 hours. Coagulation Profile: No results for input(s): "INR", "PROTIME" in the last 168 hours. Cardiac Enzymes: No results for  input(s): "CKTOTAL", "CKMB", "CKMBINDEX", "TROPONINI" in the last 168 hours. BNP (last 3 results) No results for input(s): "PROBNP" in the last 8760 hours. HbA1C: No results for input(s): "HGBA1C" in the last 72 hours. CBG: No results for input(s): "GLUCAP" in the last 168 hours. Lipid Profile: No results for input(s): "CHOL", "HDL", "LDLCALC", "TRIG", "CHOLHDL", "LDLDIRECT" in the last 72 hours. Thyroid Function Tests: No results for input(s): "TSH", "T4TOTAL", "FREET4", "T3FREE", "THYROIDAB" in the last 72 hours. Anemia Panel: No results for input(s): "VITAMINB12", "FOLATE", "FERRITIN", "TIBC", "IRON", "RETICCTPCT" in the last 72 hours. Sepsis Labs: No results for input(s): "PROCALCITON", "LATICACIDVEN" in the last 168 hours.  No results found for this or any previous visit (from the past 240 hour(s)).        Radiology Studies: No results found.      Scheduled Meds:  amiodarone  200 mg Oral Daily  apixaban  2.5 mg Oral BID   atorvastatin  40 mg Oral Daily   Chlorhexidine Gluconate Cloth  6 each Topical Daily   citalopram  20 mg Oral Daily   feeding supplement  1 Container Oral TID BM   megestrol  200 mg Oral Daily   midodrine  10 mg Oral TID AC & HS   multivitamin with minerals  1 tablet Oral Daily   pantoprazole  40 mg Oral BID AC   polyethylene glycol  17 g Oral Daily   senna-docusate  2 tablet Oral BID   sodium chloride flush  3 mL Intravenous Q12H   torsemide  20 mg Oral Daily   Continuous Infusions:  sodium chloride Stopped (08/10/22 2209)   anticoagulant sodium citrate       LOS: 32 days    Time spent: 25 mins     Wyvonnia Dusky, MD [facility] Pager [phone]  If 7PM-7AM, please contact night-coverage [email or web address] 08/19/2022, 8:11 AM

## 2022-08-19 NOTE — Progress Notes (Addendum)
Received patient from ICU.  Patient is alert and oriented x 4.  On optiflow at 50 L/min at 67% via HFNC.  Oriented to room and unit routine.  Assisted in position of comfort in bed.  Call bell within reach.  Needs addressed.

## 2022-08-20 DIAGNOSIS — I1310 Hypertensive heart and chronic kidney disease without heart failure, with stage 1 through stage 4 chronic kidney disease, or unspecified chronic kidney disease: Secondary | ICD-10-CM | POA: Diagnosis not present

## 2022-08-20 DIAGNOSIS — R627 Adult failure to thrive: Secondary | ICD-10-CM | POA: Diagnosis not present

## 2022-08-20 DIAGNOSIS — I5043 Acute on chronic combined systolic (congestive) and diastolic (congestive) heart failure: Secondary | ICD-10-CM | POA: Diagnosis not present

## 2022-08-20 LAB — BASIC METABOLIC PANEL
Anion gap: 15 (ref 5–15)
BUN: 123 mg/dL — ABNORMAL HIGH (ref 8–23)
CO2: 16 mmol/L — ABNORMAL LOW (ref 22–32)
Calcium: 9 mg/dL (ref 8.9–10.3)
Chloride: 98 mmol/L (ref 98–111)
Creatinine, Ser: 5.31 mg/dL — ABNORMAL HIGH (ref 0.44–1.00)
GFR, Estimated: 8 mL/min — ABNORMAL LOW (ref >60)
Glucose, Bld: 77 mg/dL (ref 70–99)
Potassium: 4.5 mmol/L (ref 3.5–5.1)
Sodium: 129 mmol/L — ABNORMAL LOW (ref 135–145)

## 2022-08-20 LAB — CBC
HCT: 29.1 % — ABNORMAL LOW (ref 36.0–46.0)
Hemoglobin: 9.7 g/dL — ABNORMAL LOW (ref 12.0–15.0)
MCH: 29.9 pg (ref 26.0–34.0)
MCHC: 33.3 g/dL (ref 30.0–36.0)
MCV: 89.8 fL (ref 80.0–100.0)
Platelets: 343 10*3/uL (ref 150–400)
RBC: 3.24 MIL/uL — ABNORMAL LOW (ref 3.87–5.11)
RDW: 17.2 % — ABNORMAL HIGH (ref 11.5–15.5)
WBC: 8.3 10*3/uL (ref 4.0–10.5)
nRBC: 0.8 % — ABNORMAL HIGH (ref 0.0–0.2)

## 2022-08-20 NOTE — Progress Notes (Signed)
Nutrition Follow-up  DOCUMENTATION CODES:   Severe malnutrition in context of chronic illness  INTERVENTION:   -Continue Boost Breeze po TID, each supplement provides 250 kcal and 9 grams of protein  -Continue Magic cup TID with meals, each supplement provides 290 kcal and 9 grams of protein  -Continue MVI with minerals daily -Continue liberalized diet of regular -RD to sign off as goals of care are clear and there is nothing more than can be offered from a nutritional standpoint, as pt does not desire PEG or feeding tube; if further nutritional issues arise, please re-consult RD  NUTRITION DIAGNOSIS:   Severe Malnutrition related to chronic illness as evidenced by severe fat depletion, severe muscle depletion.  Ongoing  GOAL:   Patient will meet greater than or equal to 90% of their needs  Progressing   MONITOR:   PO intake, Supplement acceptance, Labs, Weight trends, Skin, I & O's  REASON FOR ASSESSMENT:   Consult Assessment of nutrition requirement/status  ASSESSMENT:   66 y.o. 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  Pt s/p chest tube placement 10/24 (removed 10/29)   Reviewed I/O's: +45 ml x 24 hours and -3.7 L since 08/06/22   Pt remains with very minimal intake; noted meal completion 0-25%. Pt is drinking and accepting Boost Breeze supplements most of the time.   Palliative care team  consulted and goals of care are clear. Pt does not want to transition to comfort care, but is a DNR/ DNI. She desires life prolonging care outside of pressors, dialysis, and feeding tube. Pt continues not to meet nutritional needs due to poor oral intake, but RD has continued to try to optimize nutritional status based upon goals of care decisions (preferred supplements and diet liberalization). From a nutritional standpoint, there is nothing more to offer pt that aligns within her goals of care. RD suspects further nutritional decline. Per MD notes, anticipate hospital death. RD to sign off.   Medications reviewed and include miralax, senokot, and demadex.   Labs reviewed: Na: 129, Phos: 6.0, CBGS: 122.   Diet Order:   Diet Order             Diet regular Room service appropriate? Yes; Fluid consistency: Thin  Diet effective now                   EDUCATION NEEDS:   Education needs have been addressed  Skin:  Skin Assessment: Reviewed RN Assessment  Last BM:  08/19/22 (type 7)  Height:   Ht Readings from Last 1 Encounters:  08/02/22 5\' 6"  (1.676 m)    Weight:   Wt Readings from Last 1 Encounters:  08/20/22 49 kg    Ideal Body Weight:  59 kg  BMI:  Body mass index is 17.44 kg/m.  Estimated Nutritional Needs:   Kcal:  1400-1600kcal/day  Protein:  70-80g/day  Fluid:  1.3-1.5L/day    Loistine Chance, RD, LDN, [place] Registered Dietitian II Certified Diabetes Care and Education Specialist Please refer to [facility] for RD and/or  RD on-call/weekend/after hours pager

## 2022-08-20 NOTE — Progress Notes (Signed)
PROGRESS NOTE    Danielle [name]  VEH:[id number] DOB: Mar 02, 1956 DOA: 07/22/2022 PCP: [name], PA    Assessment & Plan:   Principal Problem:   Acute on chronic combined systolic and diastolic CHF (congestive heart failure) ([place]) Active Problems:   Acute on chronic respiratory failure with hypoxia (HCC)   Cardiogenic shock (HCC)   Pleural effusion on right   Chest pain   Cardiorenal syndrome with renal failure   Acute kidney injury superimposed on CKD (HCC)   Paroxysmal atrial fibrillation (HCC)   Protein-calorie malnutrition, severe (HCC)   Carcinoma of overlapping sites of left breast in female, estrogen receptor positive ([place])   Hyponatremia   Depression   Chronic obstructive pulmonary disease (HCC)   Hypomagnesemia   Anemia of chronic disease   Abdominal pain   HLD (hyperlipidemia)   Pleural effusion   Malignant neoplasm of upper lobe of right lung   Prolonged QT interval   Moderate aortic stenosis   Moderate tricuspid regurgitation by prior echocardiogram  Assessment and Plan:  Failure to thrive: secondary to all below. Poor prognosis. High risk for further decline. Pt does not want comfort care currently   Acute on chronic combined systolic and diastolic CHF: echo 8/36/62 -- EF 35-40%, grade II diastolic dysfunction, moderate MR, mod-severe TR, moderate AS. Milrinone drip was d/c on 08/16/22 as per cardio. Cardio signed off. Poor prognosis    Cardiogenic shock: milrinone drip was d/c on 08/16/22 as per cardio recs. Cardio signed off as they have no further recommendations. Continue on midodrine, keep MAP >65    Acute on chronic hypoxic respiratory failure:  continue on supplemental oxygen, on HFNC currently and has made no improvements in terms of respiratory status. High risk for further decline.   Etiology multifactorial- pleural effusion, cardiorenal, CHF & cardiogenic shock   Chest pain: continue w/ medical management. Troponin with borderline elevation but  flat.  Case discussed with cardiology as per Dr. Leslye Peer. No c/o chest pain today.    Pleural effusion on right: s/p thoracenteses done this year on 1/30, 2/8, 4/16, 5/12, 7/25, 8/8, 9/5. S/p thoracentesis on 10/5 with 900 cc fluid removed. Critical care team removed the right chest tube on 08/12/2022.  Critical care team does not recommend replacing chest tube. Leaking out of the previous chest tube site as per pt's nurse.    Cardiorenal syndrome: w/ renal failure. Pt does not want HD. Nephro signed off    PAF: continue on amio, eliquis. Unable to tolerate BB secondary to hypotension  AKI on CKDIIIb:  Cr is labile. BUN is trending up as well. Pt does not want HD. Nephro signed off    Severe protein-calorie malnutrition: encouraged to eat. Continue on megace. Pt does not want a feeding tube     Prolonged QT interval: continue on tele   Lung cancer & esophageal cancer: s/p radiation of for esophageal & lung cancer. Management per onco outpatient    HLD: continue on statin     Abdominal pain: resolved   ACD: H&H are labile. No need for a transfusion currently    Hypomagnesemia: WNL    Severe COPD: w/ chronic respiratory failure. Continue on supplemental oxygen    Depression: severity unknown. Continue on home dose of celexa    Hyponatremia: likely secondary to hypervolemic state. Labile    Carcinoma of overlapping sites of left breast: estrogen receptor positive. Management per onco as an outpatient       DVT prophylaxis: eliquis  Code Status:  DNR Family Communication:  Disposition Plan: unclear. Poor prognosis   Status is: Inpatient Remains inpatient appropriate because: severity of illness, poor prognosis     Level of care: Progressive Consultants:  ICU Nephro  Cardio   Procedures:   Antimicrobials:   Subjective: Pt still c/o shortness of breath   Objective: Vitals:   08/19/22 2350 08/20/22 0421 08/20/22 0428 08/20/22 0807  BP:  (!) 82/60    Pulse:  96     Resp:      Temp:  97.9 F (36.6 C)    TempSrc:  Oral    SpO2: 95% 90%  90%  Weight:   49 kg   Height:        Intake/Output Summary (Last 24 hours) at 08/20/2022 0812 Last data filed at 08/19/2022 1800 Gross per 24 hour  Intake 120 ml  Output 75 ml  Net 45 ml   Filed Weights   08/16/22 0500 08/17/22 0500 08/20/22 0428  Weight: 45.2 kg 45 kg 49 kg    Examination:  General exam: Appears uncomfortable. Frail appearing   Respiratory system: decreased breath sounds b/l  Cardiovascular system: S1/S2+. No rubs or gallops Gastrointestinal system: Abd is soft, NT, ND & hypoactive bowel sounds Central nervous system: alert and awake. Moves all extremities  Psychiatry: judgement and insight appears at baseline. Flat mood and affect    Data Reviewed: I have personally reviewed following labs and imaging studies  CBC: Recent Labs  Lab 08/16/22 0448 08/17/22 0610 08/18/22 0437 08/19/22 0456 08/20/22 0526  WBC 8.7 8.7 8.2 7.9 8.3  HGB 9.5* 9.4* 9.8* 9.2* 9.7*  HCT 28.0* 27.5* 29.4* 27.0* 29.1*  MCV 89.5 88.1 89.4 88.8 89.8  PLT 346 337 379 356 546   Basic Metabolic Panel: Recent Labs  Lab 08/16/22 0448 08/17/22 0610 08/18/22 0437 08/19/22 0456 08/20/22 0526  NA 130* 130* 129* 130* 129*  K 4.2 4.4 4.5 4.1 4.5  CL 95* 96* 95* 97* 98  CO2 20* 20* 19* 18* 16*  GLUCOSE 115* 86 84 106* 77  BUN 122* 116* 119* 122* 123*  CREATININE 4.70* 4.63* 4.66* 5.43* 5.31*  CALCIUM 9.1 9.4 9.4 9.0 9.0   GFR: Estimated Creatinine Clearance: 7.8 mL/min (A) (by C-G formula based on SCr of 5.31 mg/dL (H)). Liver Function Tests: No results for input(s): "AST", "ALT", "ALKPHOS", "BILITOT", "PROT", "ALBUMIN" in the last 168 hours. No results for input(s): "LIPASE", "AMYLASE" in the last 168 hours. No results for input(s): "AMMONIA" in the last 168 hours. Coagulation Profile: No results for input(s): "INR", "PROTIME" in the last 168 hours. Cardiac Enzymes: No results for input(s):  "CKTOTAL", "CKMB", "CKMBINDEX", "TROPONINI" in the last 168 hours. BNP (last 3 results) No results for input(s): "PROBNP" in the last 8760 hours. HbA1C: No results for input(s): "HGBA1C" in the last 72 hours. CBG: No results for input(s): "GLUCAP" in the last 168 hours. Lipid Profile: No results for input(s): "CHOL", "HDL", "LDLCALC", "TRIG", "CHOLHDL", "LDLDIRECT" in the last 72 hours. Thyroid Function Tests: No results for input(s): "TSH", "T4TOTAL", "FREET4", "T3FREE", "THYROIDAB" in the last 72 hours. Anemia Panel: No results for input(s): "VITAMINB12", "FOLATE", "FERRITIN", "TIBC", "IRON", "RETICCTPCT" in the last 72 hours. Sepsis Labs: No results for input(s): "PROCALCITON", "LATICACIDVEN" in the last 168 hours.  No results found for this or any previous visit (from the past 240 hour(s)).        Radiology Studies: No results found.      Scheduled Meds:  amiodarone  200 mg Oral Daily  apixaban  2.5 mg Oral BID   atorvastatin  40 mg Oral Daily   Chlorhexidine Gluconate Cloth  6 each Topical Daily   citalopram  20 mg Oral Daily   feeding supplement  1 Container Oral TID BM   megestrol  200 mg Oral Daily   midodrine  10 mg Oral TID AC & HS   multivitamin with minerals  1 tablet Oral Daily   pantoprazole  40 mg Oral BID AC   polyethylene glycol  17 g Oral Daily   senna-docusate  2 tablet Oral BID   sodium chloride flush  3 mL Intravenous Q12H   torsemide  20 mg Oral Daily   Continuous Infusions:  sodium chloride Stopped (08/10/22 2209)   anticoagulant sodium citrate       LOS: 33 days    Time spent: 25 mins     Wyvonnia Dusky, MD [facility] Pager [phone]  If 7PM-7AM, please contact night-coverage [email or web address] 08/20/2022, 8:12 AM

## 2022-08-21 DIAGNOSIS — R627 Adult failure to thrive: Secondary | ICD-10-CM | POA: Diagnosis not present

## 2022-08-21 DIAGNOSIS — I5043 Acute on chronic combined systolic (congestive) and diastolic (congestive) heart failure: Secondary | ICD-10-CM | POA: Diagnosis not present

## 2022-08-21 DIAGNOSIS — I1310 Hypertensive heart and chronic kidney disease without heart failure, with stage 1 through stage 4 chronic kidney disease, or unspecified chronic kidney disease: Secondary | ICD-10-CM | POA: Diagnosis not present

## 2022-08-21 LAB — BASIC METABOLIC PANEL
Anion gap: 16 — ABNORMAL HIGH (ref 5–15)
BUN: 128 mg/dL — ABNORMAL HIGH (ref 8–23)
CO2: 16 mmol/L — ABNORMAL LOW (ref 22–32)
Calcium: 9.2 mg/dL (ref 8.9–10.3)
Chloride: 98 mmol/L (ref 98–111)
Creatinine, Ser: 5.24 mg/dL — ABNORMAL HIGH (ref 0.44–1.00)
GFR, Estimated: 8 mL/min — ABNORMAL LOW (ref >60)
Glucose, Bld: 74 mg/dL (ref 70–99)
Potassium: 4.3 mmol/L (ref 3.5–5.1)
Sodium: 130 mmol/L — ABNORMAL LOW (ref 135–145)

## 2022-08-21 LAB — CBC
HCT: 29 % — ABNORMAL LOW (ref 36.0–46.0)
Hemoglobin: 9.8 g/dL — ABNORMAL LOW (ref 12.0–15.0)
MCH: 30.2 pg (ref 26.0–34.0)
MCHC: 33.8 g/dL (ref 30.0–36.0)
MCV: 89.2 fL (ref 80.0–100.0)
Platelets: 349 10*3/uL (ref 150–400)
RBC: 3.25 MIL/uL — ABNORMAL LOW (ref 3.87–5.11)
RDW: 17.6 % — ABNORMAL HIGH (ref 11.5–15.5)
WBC: 7.7 10*3/uL (ref 4.0–10.5)
nRBC: 0.6 % — ABNORMAL HIGH (ref 0.0–0.2)

## 2022-08-21 MED ORDER — POLYETHYLENE GLYCOL 3350 17 G PO PACK: 17.0000 g | PACK | Freq: Every day | ORAL | Status: DC | PRN

## 2022-08-21 MED ORDER — SODIUM BICARBONATE 650 MG PO TABS
650.0000 mg | ORAL_TABLET | Freq: Three times a day (TID) | ORAL | Status: DC
  Administered 2022-08-21 – 2022-08-24 (×10): 650 mg via ORAL
  Filled 2022-08-21 (×10): qty 1

## 2022-08-21 MED ORDER — SALINE SPRAY 0.65 % NA SOLN
1.0000 | NASAL | Status: DC | PRN
  Administered 2022-08-21: 1 via NASAL
  Filled 2022-08-21: qty 44

## 2022-08-21 MED ORDER — LOPERAMIDE HCL 2 MG PO CAPS
4.0000 mg | ORAL_CAPSULE | Freq: Once | ORAL | Status: AC
  Administered 2022-08-21: 4 mg via ORAL
  Filled 2022-08-21: qty 2

## 2022-08-21 NOTE — Progress Notes (Signed)
PROGRESS NOTE   HPI was taken from Dr. Damita Dunnings: Danielle [name] is a 66 y.o. female with medical history significant for Paroxysmal A-fib on chronic anticoagulation therapy, history of chronic systolic heart failure, history of adenocarcinoma of the right lung status post radiation therapy, history of stage IIIb chronic kidney disease, aortic valve stenosis, COPD with chronic respiratory failure on 4 L of oxygen continuous, history of left breast cancer status post mastectomy, history of esophageal cancer recurrent right-sided pleural effusion requiring thoracentesis Most recently on 06/19/2022 who presents to the ER for evaluation of worsening shortness of breath from her baseline.  She has associated chest tightness.  Denies one-sided leg pain numbness one-sided leg swelling .his symptoms feel similar to prior episodes when she required thoracentesis. ED course and data review: BP 135/99 with respirations 29 with O2 sat 90% on increased O2 flow rate of 50%.  Labs with troponin 15, BNP 2935.  Hemoglobin 9.7 (baseline 8.3-10.5) CMP pending, COVID and flu pending.  EKG, personally reviewed and interpreted showing A-fib at 106 and prolonged QT of 414 with no acute ischemic changes.  Chest x-ray shows the following: IMPRESSION: 1. Small right pleural effusion, increased since prior study. 2. Increasing right basilar consolidation, which may reflect airspace disease or progressive atelectasis. 3. Masslike linear consolidation right suprahilar region, unchanged since recent CT.     Patient given a dose of Lasix 40 mg IV.  Hospitalist consulted for admission.   As per Dr. Leslye Peer: Danielle [name] is a 66 y.o. female with medical history significant for Paroxysmal A-fib on chronic anticoagulation therapy, history of chronic systolic heart failure, history of adenocarcinoma of the right lung status post radiation therapy, history of stage IIIb chronic kidney disease, aortic valve stenosis, COPD with chronic  respiratory failure on 4 L of oxygen continuous, history of left breast cancer status post mastectomy, history of esophageal cancer recurrent right-sided pleural effusion requiring thoracentesis, most recently on 06/19/2022 who presented to the ER on 07/20/2022 for evaluation of worsening shortness of breath with associated chest tightness.     ED course and evaluation --- BP 135/99 with RR 29 with O2 sat 90% on increased O2 flow rate of 5 L/min.  Labs with troponin 15>>17, BNP 2935.  Treated with IV Lasix in the ED.   Chest x-ray showed -- Small right pleural effusion, increased since prior study.  Increasing right basilar consolidation, which may reflect airspace disease or progressive atelectasis.  Mass-like linear consolidation right suprahilar region, unchanged since recent CT.   Patient was admitted to hospitalist service and IV diuresis was continued.     IR was consulted for thoracentesis which was done on 10/5 with 900 cc fluid removed.   Hospital course complicated by: --Acute kidney injury after diuresis due to cardiorenal physiology.  Nephrology was consulted.   Renal function improved with diuretics held, but pt with persistent and worsening exertional dyspnea again. --Abdominal pain for which evaluation including abdominal xray and CT abdomen/pelvis was unremarkable. --Swallowing difficulties with severe esophageal dysmotility discovered on barium esophagram.     10/19.  Patient developed worsening hypoxemia, currently on high flow oxygen.  We will place a Pleurx catheter as patient pleural effusion has reaccumulated, planned on Monday. Also restarted Lasix drip with milrinone after cardiology consult. 10/20.  Lasix discontinued as patient renal function is getting worse. 10/24.  Paracentesis performed, hemodialysis is planned.    10/25.  Called with blood pressure in the 60s.  Fluid bolus given.  Lasix and spironolactone discontinued.  We will start midodrine.  Yesterday with  palliative care they did not want pressors.  Called patient's daughter with multiorgan failure and increasing oxygen requirements.  Case discussed with palliative care.   10/26 through 10/27 patient still has cardiogenic shock, still on heated high flow nasal cannula, still with acute kidney injury.   10/28.  Critical care team would like to remove the chest tube but patient does not want to remove chest tube at this time.  Increased risk of infection explained.   10/29.  Critical care team removed chest tube   10/30.  Patient with increased oxygen requirements on 61% FiO2 heated high flow nasal cannula.   10/31.  Patient complained of chest pain.  Cardiology did not want to do any aggressive management.  Morphine given.  Troponins with borderline elevation but flat.  Patient still on milrinone drip.  Fluid leaking out of the chest tube site.  Also has poor appetite.  He still on heated high flow nasal cannula.   As per Dr. Jimmye Norman 11/1-11/7/23: Pt has made no progress this week. Pt is still requiring HFNC and cannot be weaned off. Milrinone was d/c on 08/16/22 as per cardio recs. Cardio has signed off as they do not have any further recommendations. Palliative care has had multiple discussions w/ the pt and pt's family in regards to comfort care only but pt continues to refuse. I too have discussed comfort care w/ the pt.  Pt also refuses HD and a feeding tube. See previous progress/consult notes for more information.      Danielle [name]  ION:[id number] DOB: [birth year] DOA: 08/03/2022 PCP: [name], PA    Assessment & Plan:   Principal Problem:   Acute on chronic combined systolic and diastolic CHF (congestive heart failure) ([place]) Active Problems:   Acute on chronic respiratory failure with hypoxia (HCC)   Cardiogenic shock (HCC)   Pleural effusion on right   Chest pain   Cardiorenal syndrome with renal failure   Acute kidney injury superimposed on CKD (HCC)   Paroxysmal atrial  fibrillation (HCC)   Protein-calorie malnutrition, severe (HCC)   Carcinoma of overlapping sites of left breast in female, estrogen receptor positive ([place])   Hyponatremia   Depression   Chronic obstructive pulmonary disease (HCC)   Hypomagnesemia   Anemia of chronic disease   Abdominal pain   HLD (hyperlipidemia)   Pleural effusion   Malignant neoplasm of upper lobe of right lung   Prolonged QT interval   Moderate aortic stenosis   Moderate tricuspid regurgitation by prior echocardiogram  Assessment and Plan:  Failure to thrive: secondary to all below. Poor prognosis. High risk for further decline. Pt does not want comfort care currently   Acute on chronic combined systolic and diastolic CHF: echo 2/44/01 -- EF 35-40%, grade II diastolic dysfunction, moderate MR, mod-severe TR, moderate AS. Milrinone drip was d/c on 08/16/22 as per cardio. Cardio signed off. Poor prognosis    Cardiogenic shock: milrinone drip was d/c on 08/16/22 as per cardio recs. Cardio signed off as they have no further recommendations. Continue on midodrine, keep MAP >65    Acute on chronic hypoxic respiratory failure:  continue on supplemental oxygen, on HFNC currently and has made no improvements in terms of respiratory status. High risk for further decline.   Etiology multifactorial- pleural effusion, cardiorenal, CHF & cardiogenic shock   Chest pain: continue w/ medical management. Troponin with borderline elevation but flat.  Case discussed with cardiology as per Dr. Leslye Peer.  No c/o chest pain today.    Pleural effusion on right: s/p thoracenteses done this year on 1/30, 2/8, 4/16, 5/12, 7/25, 8/8, 9/5. S/p thoracentesis on 10/5 with 900 cc fluid removed. Critical care team removed the right chest tube on 08/12/2022.  Critical care team does not recommend replacing chest tube. Leaking out of the previous chest tube site as per pt's nurse.    Cardiorenal syndrome: w/ renal failure. Pt does not want HD. Nephro has  signed off    PAF: continue on amio, eliquis. Unable to tolerate BB secondary to hypotension  AKI on CKDIIIb: Cr is labile. BUN continues to trend up. Pt does not want HD. Nephro signed off    Severe protein-calorie malnutrition: encouraged to eat. Continue on megace. Pt does not want a feeding tube  Prolonged QT interval: continue on tele   Lung cancer & esophageal cancer: s/p radiation of for esophageal & lung cancer. Management per onco outpatient    HLD: continue on statin      Abdominal pain: resolved   ACD: H&H are labile. Will transfuse if Hb < 7.0     Hypomagnesemia: WNL    Severe COPD: w/ chronic respiratory failure. Continue on supplemental oxygen     Depression: severity unknown. Continue on home dose of celexa    Hyponatremia: likely secondary to hypervolemic state. Labile    Carcinoma of overlapping sites of left breast: estrogen receptor positive. Management per onco as an outpatient       DVT prophylaxis: eliquis  Code Status: DNR Family Communication:  Disposition Plan: unclear. Poor prognosis   Status is: Inpatient Remains inpatient appropriate because: severity of illness, poor prognosis     Level of care: Progressive Consultants:  ICU Nephro  Cardio   Procedures:   Antimicrobials:   Subjective: Pt c/o shortness of breath   Objective: Vitals:   08/20/22 2340 08/21/22 0328 08/21/22 0415 08/21/22 0824  BP: 108/86  101/76 104/78  Pulse: 84 78 78 (!) 110  Resp: 18  17 19   Temp: (!) 97.5 F (36.4 C)  97.8 F (36.6 C) 97.8 F (36.6 C)  TempSrc: Oral  Oral   SpO2: 91% 90% 91% 94%  Weight:   47.3 kg   Height:        Intake/Output Summary (Last 24 hours) at 08/21/2022 0829 Last data filed at 08/20/2022 2223 Gross per 24 hour  Intake 3 ml  Output --  Net 3 ml   Filed Weights   08/17/22 0500 08/20/22 0428 08/21/22 0415  Weight: 45 kg 49 kg 47.3 kg    Examination:  General exam: Appears calm but uncomfortable. Frail appearing    Respiratory system: diminished breath sounds b/l  Cardiovascular system: S1 & S2+. No rubs or clicks  Gastrointestinal system: abd is soft, NT, ND & hyperactive bowel sounds  Central nervous system: alert and awake. Moves all extremities   Psychiatry: judgement and insight appears at baseline. Flat mood and affect    Data Reviewed: I have personally reviewed following labs and imaging studies  CBC: Recent Labs  Lab 08/17/22 0610 08/18/22 0437 08/19/22 0456 08/20/22 0526 08/21/22 0553  WBC 8.7 8.2 7.9 8.3 7.7  HGB 9.4* 9.8* 9.2* 9.7* 9.8*  HCT 27.5* 29.4* 27.0* 29.1* 29.0*  MCV 88.1 89.4 88.8 89.8 89.2  PLT 337 379 356 343 536   Basic Metabolic Panel: Recent Labs  Lab 08/17/22 0610 08/18/22 0437 08/19/22 0456 08/20/22 0526 08/21/22 0553  NA 130* 129* 130* 129* 130*  K 4.4 4.5 4.1 4.5 4.3  CL 96* 95* 97* 98 98  CO2 20* 19* 18* 16* 16*  GLUCOSE 86 84 106* 77 74  BUN 116* 119* 122* 123* 128*  CREATININE 4.63* 4.66* 5.43* 5.31* 5.24*  CALCIUM 9.4 9.4 9.0 9.0 9.2   GFR: Estimated Creatinine Clearance: 7.7 mL/min (A) (by C-G formula based on SCr of 5.24 mg/dL (H)). Liver Function Tests: No results for input(s): "AST", "ALT", "ALKPHOS", "BILITOT", "PROT", "ALBUMIN" in the last 168 hours. No results for input(s): "LIPASE", "AMYLASE" in the last 168 hours. No results for input(s): "AMMONIA" in the last 168 hours. Coagulation Profile: No results for input(s): "INR", "PROTIME" in the last 168 hours. Cardiac Enzymes: No results for input(s): "CKTOTAL", "CKMB", "CKMBINDEX", "TROPONINI" in the last 168 hours. BNP (last 3 results) No results for input(s): "PROBNP" in the last 8760 hours. HbA1C: No results for input(s): "HGBA1C" in the last 72 hours. CBG: No results for input(s): "GLUCAP" in the last 168 hours. Lipid Profile: No results for input(s): "CHOL", "HDL", "LDLCALC", "TRIG", "CHOLHDL", "LDLDIRECT" in the last 72 hours. Thyroid Function Tests: No results for  input(s): "TSH", "T4TOTAL", "FREET4", "T3FREE", "THYROIDAB" in the last 72 hours. Anemia Panel: No results for input(s): "VITAMINB12", "FOLATE", "FERRITIN", "TIBC", "IRON", "RETICCTPCT" in the last 72 hours. Sepsis Labs: No results for input(s): "PROCALCITON", "LATICACIDVEN" in the last 168 hours.  No results found for this or any previous visit (from the past 240 hour(s)).        Radiology Studies: No results found.      Scheduled Meds:  amiodarone  200 mg Oral Daily   apixaban  2.5 mg Oral BID   atorvastatin  40 mg Oral Daily   citalopram  20 mg Oral Daily   feeding supplement  1 Container Oral TID BM   megestrol  200 mg Oral Daily   midodrine  10 mg Oral TID AC & HS   multivitamin with minerals  1 tablet Oral Daily   pantoprazole  40 mg Oral BID AC   polyethylene glycol  17 g Oral Daily   senna-docusate  2 tablet Oral BID   sodium chloride flush  3 mL Intravenous Q12H   torsemide  20 mg Oral Daily   Continuous Infusions:  sodium chloride Stopped (08/10/22 2209)   anticoagulant sodium citrate       LOS: 34 days    Time spent: 25 mins     Wyvonnia Dusky, MD [facility] Pager [phone]  If 7PM-7AM, please contact night-coverage [email or web address] 08/21/2022, 8:29 AM

## 2022-08-21 NOTE — Plan of Care (Addendum)
Spoke with attending Dr. Jimmye Norman. She has been speaking with patient and daughter. No change in patient's requests on care. Patient status continues to decline, patient is not ready to transition to comfort care. Continue to anticipate hospital death.  Attending team to reach out to PMT as needed.

## 2022-08-22 ENCOUNTER — Ambulatory Visit: Payer: Medicare Other | Admitting: Family

## 2022-08-22 DIAGNOSIS — E785 Hyperlipidemia, unspecified: Secondary | ICD-10-CM

## 2022-08-22 DIAGNOSIS — I1310 Hypertensive heart and chronic kidney disease without heart failure, with stage 1 through stage 4 chronic kidney disease, or unspecified chronic kidney disease: Secondary | ICD-10-CM | POA: Diagnosis not present

## 2022-08-22 DIAGNOSIS — N179 Acute kidney failure, unspecified: Secondary | ICD-10-CM | POA: Diagnosis not present

## 2022-08-22 DIAGNOSIS — I5043 Acute on chronic combined systolic (congestive) and diastolic (congestive) heart failure: Secondary | ICD-10-CM | POA: Diagnosis not present

## 2022-08-22 DIAGNOSIS — R570 Cardiogenic shock: Secondary | ICD-10-CM | POA: Diagnosis not present

## 2022-08-22 LAB — BASIC METABOLIC PANEL
Anion gap: 15 (ref 5–15)
BUN: 125 mg/dL — ABNORMAL HIGH (ref 8–23)
CO2: 19 mmol/L — ABNORMAL LOW (ref 22–32)
Calcium: 9.4 mg/dL (ref 8.9–10.3)
Chloride: 100 mmol/L (ref 98–111)
Creatinine, Ser: 5.09 mg/dL — ABNORMAL HIGH (ref 0.44–1.00)
GFR, Estimated: 9 mL/min — ABNORMAL LOW (ref >60)
Glucose, Bld: 100 mg/dL — ABNORMAL HIGH (ref 70–99)
Potassium: 4 mmol/L (ref 3.5–5.1)
Sodium: 134 mmol/L — ABNORMAL LOW (ref 135–145)

## 2022-08-22 LAB — CBC
HCT: 29.6 % — ABNORMAL LOW (ref 36.0–46.0)
Hemoglobin: 10.1 g/dL — ABNORMAL LOW (ref 12.0–15.0)
MCH: 30.3 pg (ref 26.0–34.0)
MCHC: 34.1 g/dL (ref 30.0–36.0)
MCV: 88.9 fL (ref 80.0–100.0)
Platelets: 371 10*3/uL (ref 150–400)
RBC: 3.33 MIL/uL — ABNORMAL LOW (ref 3.87–5.11)
RDW: 18 % — ABNORMAL HIGH (ref 11.5–15.5)
WBC: 8.4 10*3/uL (ref 4.0–10.5)
nRBC: 0.6 % — ABNORMAL HIGH (ref 0.0–0.2)

## 2022-08-22 NOTE — Progress Notes (Signed)
Progress Note   Patient: Danielle [name] GDJ:[id number] DOB: [birth year] DOA: 07/25/2022     35 DOS: the patient was seen and examined on 08/22/2022   Brief hospital course: Danielle [name] is a 66 y.o. female with medical history significant for Paroxysmal A-fib on chronic anticoagulation therapy, history of chronic systolic heart failure, history of adenocarcinoma of the right lung status post radiation therapy, history of stage IIIb chronic kidney disease, aortic valve stenosis, COPD with chronic respiratory failure on 4 L of oxygen continuous, history of left breast cancer status post mastectomy, history of esophageal cancer recurrent right-sided pleural effusion requiring thoracentesis, most recently on 06/19/2022 who presented to the ER on 07/26/2022 for evaluation of worsening shortness of breath with associated chest tightness.    ED course and evaluation --- BP 135/99 with RR 29 with O2 sat 90% on increased O2 flow rate of 5 L/min.  Labs with troponin 15>>17, BNP 2935.  Treated with IV Lasix in the ED.  Chest x-ray showed -- Small right pleural effusion, increased since prior study.  Increasing right basilar consolidation, which may reflect airspace disease or progressive atelectasis.  Mass-like linear consolidation right suprahilar region, unchanged since recent CT.  Patient was admitted to hospitalist service and IV diuresis was continued.    IR was consulted for thoracentesis which was done on 10/5 with 900 cc fluid removed.   Hospital course complicated by: --Acute kidney injury after diuresis due to cardiorenal physiology.  Nephrology was consulted.   Renal function improved with diuretics held, but pt with persistent and worsening exertional dyspnea again. --Abdominal pain for which evaluation including abdominal xray and CT abdomen/pelvis was unremarkable. --Swallowing difficulties with severe esophageal dysmotility discovered on barium esophagram.    10/19.  Patient developed  worsening hypoxemia, currently on high flow oxygen.  We will place a Pleurx catheter as patient pleural effusion has reaccumulated, planned on Monday. Also restarted Lasix drip with milrinone after cardiology consult. 10/20.  Lasix discontinued as patient renal function is getting worse. 10/24.  Paracentesis performed, hemodialysis is planned.   10/25.  Called with blood pressure in the 60s.  Fluid bolus given.  Lasix and spironolactone discontinued.  We will start midodrine.  Yesterday with palliative care they did not want pressors.  Called patient's daughter with multiorgan failure and increasing oxygen requirements.  Case discussed with palliative care.  10/26 through 10/27 patient still has cardiogenic shock, still on heated high flow nasal cannula, still with acute kidney injury.  10/28.  Critical care team would like to remove the chest tube but patient does not want to remove chest tube at this time.  Increased risk of infection explained.  10/29.  Critical care team removed chest tube  10/30.  Patient with increased oxygen requirements on 61% FiO2 heated high flow nasal cannula.  10/31.  Patient complained of chest pain.  Cardiology did not want to do any aggressive management.  Morphine given.  Troponins with borderline elevation but flat.  Patient still on milrinone drip.  Fluid leaking out of the chest tube site.  Also has poor appetite.  He still on heated high flow nasal cannula.  As Dr. Jimmye Norman 11/1-11/7/23: Pt has made no progress this week. Pt is still requiring HFNC and cannot be weaned off. Milrinone was d/c on 08/16/22 as per cardio recs. Cardio has signed off as they do not have any further recommendations. Palliative care has had multiple discussions w/ the pt and pt's family in regards to comfort care only  but pt continues to refuse. I too have discussed comfort care w/ the pt.  Pt also refuses HD and a feeding tube. See previous progress/consult notes for more information.    Assessment and Plan: * Acute on chronic combined systolic and diastolic CHF (congestive heart failure) (HCC) Echo 01/30/22 -- EF 35-40%, grade II diastolic dysfunction, moderate MR, mod-severe TR, moderate AS. Patient off milrinone drip.  Overall prognosis is poor with cardiogenic shock. Low-dose torsemide if blood pressure allows.   Cardiogenic shock (HCC) Off milrinone drip at this point.  Continue midodrine.  Acute on chronic respiratory failure with hypoxia (HCC) Baseline on 4 L/min continuous O2. This morning she was on heated high flow nasal cannula 95% oxygen and 50 L flow  Pleural effusion on right  Chart reviewed: prior thoracenteses done this year on 1/30, 2/8, 4/16, 5/12, 7/25, 8/8, 9/5 --Underwent thoracentesis on 10/5 with 900 cc fluid removed. -Critical care team removed the right chest tube on 08/12/2022.  Fluid will likely reaccumulate.  Critical care team does not recommend replacing chest tube.   Cardiorenal syndrome with renal failure Nephrology signed off because the patient does not want dialysis.  Last creatinine 5.09.  Paroxysmal atrial fibrillation (HCC) --Continue amiodarone and Eliquis.  Unable to tolerate beta-blocker secondary to hypotension.  Acute kidney injury superimposed on CKD ([place]) Acute kidney injury on CKD stage IIIb.  Nephrology signed off.  Today's creatinine 5.09.   Protein-calorie malnutrition, severe ([place]) Failure to thrive.  Encouraged to eat.  Trial of Megace.  Palliative care following.    Prolonged QT interval Continue to monitor on telemetry.  Malignant neoplasm of upper lobe of right lung Malignant neoplasm of lower third of esophagus Patient is status post radiation therapy for esophageal cancer S/p radiation therapy for lung cancer   HLD (hyperlipidemia) On Lipitor  Abdominal pain Continue to monitor.  Anemia of chronic disease Last hemoglobin 10.1  Hypomagnesemia Replaced  Chronic obstructive pulmonary  disease (HCC) Severe COPD with chronic respiratory failure on 4 L of continuous O2 at home.   Depression Continue Celexa 20 mg daily  Hyponatremia Sodium one points lower than normal range.  Likely secondary to hypervolemic state.  Carcinoma of overlapping sites of left breast in female, estrogen receptor positive ([place]) Noted. No acute issues.        Subjective: Patient feels short of breath.  Some cough.  Some wheeze.  Not eating very much.  Initially admitted 34 days ago with recurrent pleural effusion.  Currently on 95% FiO2 on heated high flow nasal cannula.  Physical Exam: Vitals:   08/22/22 0738 08/22/22 0739 08/22/22 0834 08/22/22 1120  BP: 91/75 99/80  104/77  Pulse: (!) 122 (!) 117  (!) 118  Resp: 18   18  Temp: 97.6 F (36.4 C)   97.6 F (36.4 C)  TempSrc:      SpO2: 92%  (!) 88% 92%  Weight:      Height:       Physical Exam HENT:     Head: Normocephalic.     Mouth/Throat:     Pharynx: No oropharyngeal exudate.  Eyes:     General: Lids are normal.     Conjunctiva/sclera: Conjunctivae normal.  Cardiovascular:     Rate and Rhythm: Regular rhythm. Tachycardia present.     Heart sounds: Normal heart sounds, S1 normal and S2 normal.  Pulmonary:     Breath sounds: Examination of the right-lower field reveals decreased breath sounds and rhonchi. Examination of the left-lower field  reveals decreased breath sounds. Decreased breath sounds and rhonchi present. No wheezing or rales.  Abdominal:     Palpations: Abdomen is soft.     Tenderness: There is no abdominal tenderness.  Musculoskeletal:     Right lower leg: Swelling present.     Left lower leg: Swelling present.  Skin:    General: Skin is warm.     Findings: No rash.  Neurological:     Mental Status: She is alert and oriented to person, place, and time.     Data Reviewed: Sodium 134, creatinine 5.09, hemoglobin 10.1   Family Communication: Left message for patient's  daughter  Disposition: Status is: Inpatient Remains inpatient appropriate because: Still on heated high flow nasal cannula 95% FiO2.  Planned Discharge Destination: To be determined based on clinical course    Time spent: 28 minutes  Author: Loletha Grayer, MD 08/22/2022 1:27 PM  For on call review [email or web address].

## 2022-08-23 DIAGNOSIS — I1310 Hypertensive heart and chronic kidney disease without heart failure, with stage 1 through stage 4 chronic kidney disease, or unspecified chronic kidney disease: Secondary | ICD-10-CM | POA: Diagnosis not present

## 2022-08-23 DIAGNOSIS — R570 Cardiogenic shock: Secondary | ICD-10-CM | POA: Diagnosis not present

## 2022-08-23 DIAGNOSIS — I5043 Acute on chronic combined systolic (congestive) and diastolic (congestive) heart failure: Secondary | ICD-10-CM | POA: Diagnosis not present

## 2022-08-23 DIAGNOSIS — J9621 Acute and chronic respiratory failure with hypoxia: Secondary | ICD-10-CM | POA: Diagnosis not present

## 2022-08-23 NOTE — Progress Notes (Signed)
Progress Note   Patient: Danielle [name] MPN:[id number] DOB: 06/20/1956 DOA: 08/05/2022     36 DOS: the patient was seen and examined on 08/23/2022   Brief hospital course: ALANIE SYLER is a 66 y.o. female with medical history significant for Paroxysmal A-fib on chronic anticoagulation therapy, history of chronic systolic heart failure, history of adenocarcinoma of the right lung status post radiation therapy, history of stage IIIb chronic kidney disease, aortic valve stenosis, COPD with chronic respiratory failure on 4 L of oxygen continuous, history of left breast cancer status post mastectomy, history of esophageal cancer recurrent right-sided pleural effusion requiring thoracentesis, most recently on 06/19/2022 who presented to the ER on 07/28/2022 for evaluation of worsening shortness of breath with associated chest tightness.    ED course and evaluation --- BP 135/99 with RR 29 with O2 sat 90% on increased O2 flow rate of 5 L/min.  Labs with troponin 15>>17, BNP 2935.  Treated with IV Lasix in the ED.  Chest x-ray showed -- Small right pleural effusion, increased since prior study.  Increasing right basilar consolidation, which may reflect airspace disease or progressive atelectasis.  Mass-like linear consolidation right suprahilar region, unchanged since recent CT.  Patient was admitted to hospitalist service and IV diuresis was continued.    IR was consulted for thoracentesis which was done on 10/5 with 900 cc fluid removed.   Hospital course complicated by: --Acute kidney injury after diuresis due to cardiorenal physiology.  Nephrology was consulted.   Renal function improved with diuretics held, but pt with persistent and worsening exertional dyspnea again. --Abdominal pain for which evaluation including abdominal xray and CT abdomen/pelvis was unremarkable. --Swallowing difficulties with severe esophageal dysmotility discovered on barium esophagram.    10/19.  Patient developed  worsening hypoxemia, currently on high flow oxygen.  We will place a Pleurx catheter as patient pleural effusion has reaccumulated, planned on Monday. Also restarted Lasix drip with milrinone after cardiology consult. 10/20.  Lasix discontinued as patient renal function is getting worse. 10/24.  Paracentesis performed, hemodialysis is planned.   10/25.  Called with blood pressure in the 60s.  Fluid bolus given.  Lasix and spironolactone discontinued.  We will start midodrine.  Yesterday with palliative care they did not want pressors.  Called patient's daughter with multiorgan failure and increasing oxygen requirements.  Case discussed with palliative care.  10/26 through 10/27 patient still has cardiogenic shock, still on heated high flow nasal cannula, still with acute kidney injury.  10/28.  Critical care team would like to remove the chest tube but patient does not want to remove chest tube at this time.  Increased risk of infection explained.  10/29.  Critical care team removed chest tube  10/30.  Patient with increased oxygen requirements on 61% FiO2 heated high flow nasal cannula.  10/31.  Patient complained of chest pain.  Cardiology did not want to do any aggressive management.  Morphine given.  Troponins with borderline elevation but flat.  Patient still on milrinone drip.  Fluid leaking out of the chest tube site.  Also has poor appetite.  He still on heated high flow nasal cannula.  As Dr. Jimmye Norman 11/1-11/7/23: Pt has made no progress this week. Pt is still requiring HFNC and cannot be weaned off. Milrinone was d/c on 08/16/22 as per cardio recs. Cardio has signed off as they do not have any further recommendations. Palliative care has had multiple discussions w/ the pt and pt's family in regards to comfort care only  but pt continues to refuse. I too have discussed comfort care w/ the pt.  Pt also refuses HD and a feeding tube. See previous progress/consult notes for more information.    08/23/2022.  Patient on heated high flow nasal cannula 100% FiO2 and 50 L flow with borderline saturations in the 80s.  Patient still wants to continue current management and does not want to do comfort care currently.  Assessment and Plan: * Acute on chronic combined systolic and diastolic CHF (congestive heart failure) (HCC) Echo 01/30/22 -- EF 35-40%, grade II diastolic dysfunction, moderate MR, mod-severe TR, moderate AS. Patient off milrinone drip.  Overall prognosis is poor with cardiogenic shock. Low-dose torsemide if blood pressure allows.   Cardiogenic shock (HCC) Off milrinone drip at this point.  Continue midodrine.  Acute on chronic respiratory failure with hypoxia (HCC) Baseline on 4 L/min continuous O2. This morning she was on heated high flow nasal cannula 100% oxygen and 50 L flow.  Patient with borderline saturations in the 80s.  Likely will start to decline soon.  Patient does not want to do comfort measures at this point.  Pleural effusion on right  Chart reviewed: prior thoracenteses done this year on 1/30, 2/8, 4/16, 5/12, 7/25, 8/8, 9/5 --Underwent thoracentesis on 10/5 with 900 cc fluid removed. -Critical care team removed the right chest tube on 08/12/2022.  Fluid will likely reaccumulate.  Critical care team does not recommend replacing chest tube.   Cardiorenal syndrome with renal failure Nephrology signed off because the patient does not want dialysis.  Last creatinine 5.09.  Paroxysmal atrial fibrillation (HCC) --Continue amiodarone and Eliquis.  Unable to tolerate beta-blocker secondary to hypotension.  Acute kidney injury superimposed on CKD ([place]) Acute kidney injury on CKD stage IIIb.  Nephrology signed off.  Last creatinine 5.09.   Protein-calorie malnutrition, severe ([place]) Failure to thrive.  Encouraged to eat.  Trial of Megace.     Prolonged QT interval Continue to monitor on telemetry.  Malignant neoplasm of upper lobe of right  lung Malignant neoplasm of lower third of esophagus Patient is status post radiation therapy for esophageal cancer S/p radiation therapy for lung cancer   HLD (hyperlipidemia) On Lipitor  Abdominal pain Continue to monitor.  Anemia of chronic disease Last hemoglobin 10.1  Hypomagnesemia Replaced  Chronic obstructive pulmonary disease (HCC) Severe COPD with chronic respiratory failure on 4 L of continuous O2 at home.   Depression Continue Celexa 20 mg daily  Hyponatremia Sodium one points lower than normal range.  Likely secondary to hypervolemic state.  Carcinoma of overlapping sites of left breast in female, estrogen receptor positive ([employer]) Noted. No acute issues.        Subjective: Patient seen this morning and was having a little shortness of breath.  Patient seen again this afternoon when she is having difficulty oxygenating.  Feels about the same.  Some shortness of breath.  Patient does not want to do comfort measures at this point.  Spoke with patient's daughter that she is desaturating and she could decline quickly.  Physical Exam: Vitals:   08/23/22 0435 08/23/22 0500 08/23/22 0854 08/23/22 1139  BP: 107/73   90/70  Pulse: (!) 108   (!) 124  Resp: (!) 22     Temp: 97.6 F (36.4 C)   97.7 F (36.5 C)  TempSrc:    Oral  SpO2: (!) 89%  (!) 87% (!) 83%  Weight:  46.5 kg    Height:       Physical  Exam HENT:     Head: Normocephalic.     Mouth/Throat:     Pharynx: No oropharyngeal exudate.  Eyes:     General: Lids are normal.     Conjunctiva/sclera: Conjunctivae normal.  Cardiovascular:     Rate and Rhythm: Regular rhythm. Tachycardia present.     Heart sounds: Normal heart sounds, S1 normal and S2 normal.  Pulmonary:     Breath sounds: Examination of the right-middle field reveals decreased breath sounds and rhonchi. Examination of the right-lower field reveals decreased breath sounds and rhonchi. Examination of the left-lower field reveals decreased  breath sounds and rhonchi. Decreased breath sounds and rhonchi present. No wheezing or rales.  Abdominal:     Palpations: Abdomen is soft.     Tenderness: There is no abdominal tenderness.  Musculoskeletal:     Right lower leg: Swelling present.     Left lower leg: Swelling present.  Skin:    General: Skin is warm.     Findings: No rash.  Neurological:     Mental Status: She is alert and oriented to person, place, and time.     Data Reviewed: No new labs today  Family Communication: Left the patient's daughter on the phone  Disposition: Status is: Inpatient Remains inpatient appropriate because: Remains on 100% heated high flow nasal cannula with borderline saturations.  If patient changes her mind for comfort care, can consider Dilaudid drip.  Planned Discharge Destination: Likely will have an in-hospital death.    Time spent: 29 minutes  Author: Loletha Grayer, MD 08/23/2022 1:31 PM  For on call review [email or web address].

## 2022-08-23 NOTE — Plan of Care (Signed)
Spoke with current attending Dr. Earleen Newport.  Patient's goals continue to be set at this time.  Attending states he will reach out to PMT if further conversations are needed.

## 2022-08-24 DIAGNOSIS — J9621 Acute and chronic respiratory failure with hypoxia: Secondary | ICD-10-CM | POA: Diagnosis not present

## 2022-08-24 DIAGNOSIS — Z515 Encounter for palliative care: Secondary | ICD-10-CM | POA: Diagnosis not present

## 2022-08-24 DIAGNOSIS — I5043 Acute on chronic combined systolic (congestive) and diastolic (congestive) heart failure: Secondary | ICD-10-CM | POA: Diagnosis not present

## 2022-08-24 DIAGNOSIS — J90 Pleural effusion, not elsewhere classified: Secondary | ICD-10-CM | POA: Diagnosis not present

## 2022-08-24 LAB — BASIC METABOLIC PANEL
Anion gap: 17 — ABNORMAL HIGH (ref 5–15)
BUN: 130 mg/dL — ABNORMAL HIGH (ref 8–23)
CO2: 17 mmol/L — ABNORMAL LOW (ref 22–32)
Calcium: 9.2 mg/dL (ref 8.9–10.3)
Chloride: 98 mmol/L (ref 98–111)
Creatinine, Ser: 5.91 mg/dL — ABNORMAL HIGH (ref 0.44–1.00)
GFR, Estimated: 7 mL/min — ABNORMAL LOW (ref >60)
Glucose, Bld: 101 mg/dL — ABNORMAL HIGH (ref 70–99)
Potassium: 5.4 mmol/L — ABNORMAL HIGH (ref 3.5–5.1)
Sodium: 132 mmol/L — ABNORMAL LOW (ref 135–145)

## 2022-08-24 LAB — CBC
HCT: 31 % — ABNORMAL LOW (ref 36.0–46.0)
Hemoglobin: 10.2 g/dL — ABNORMAL LOW (ref 12.0–15.0)
MCH: 29.6 pg (ref 26.0–34.0)
MCHC: 32.9 g/dL (ref 30.0–36.0)
MCV: 89.9 fL (ref 80.0–100.0)
Platelets: 303 10*3/uL (ref 150–400)
RBC: 3.45 MIL/uL — ABNORMAL LOW (ref 3.87–5.11)
RDW: 17.5 % — ABNORMAL HIGH (ref 11.5–15.5)
WBC: 8.4 10*3/uL (ref 4.0–10.5)
nRBC: 1.2 % — ABNORMAL HIGH (ref 0.0–0.2)

## 2022-08-24 MED ORDER — ACETAMINOPHEN 650 MG RE SUPP: 650.0000 mg | Freq: Four times a day (QID) | RECTAL | Status: DC | PRN

## 2022-08-24 MED ORDER — HYDROMORPHONE HCL-NACL 50-0.9 MG/50ML-% IV SOLN
1.0000 mg/h | INTRAVENOUS | Status: DC
  Administered 2022-08-24: 1 mg/h via INTRAVENOUS
  Filled 2022-08-24 (×2): qty 50

## 2022-08-24 MED ORDER — BIOTENE DRY MOUTH MT LIQD: 15.0000 mL | OROMUCOSAL | Status: DC | PRN

## 2022-08-24 MED ORDER — POLYVINYL ALCOHOL 1.4 % OP SOLN: 1.0000 [drp] | Freq: Four times a day (QID) | OPHTHALMIC | Status: DC | PRN

## 2022-08-24 MED ORDER — GLYCOPYRROLATE 1 MG PO TABS: 1.0000 mg | ORAL_TABLET | ORAL | Status: DC | PRN

## 2022-08-24 MED ORDER — ACETAMINOPHEN 325 MG PO TABS: 650.0000 mg | ORAL_TABLET | Freq: Four times a day (QID) | ORAL | Status: DC | PRN

## 2022-08-24 MED ORDER — GLYCOPYRROLATE 0.2 MG/ML IJ SOLN: 0.2000 mg | INTRAMUSCULAR | Status: DC | PRN

## 2022-08-24 NOTE — Progress Notes (Signed)
Progress Note   Patient: Danielle [name] PXT:[id number] DOB: 1956/07/27 DOA: 07/21/2022     37 DOS: the patient was seen and examined on 08/24/2022   Brief hospital course: Danielle [name] is a 66 y.o. female with medical history significant for Paroxysmal A-fib on chronic anticoagulation therapy, history of chronic systolic heart failure, history of adenocarcinoma of the right lung status post radiation therapy, history of stage IIIb chronic kidney disease, aortic valve stenosis, COPD with chronic respiratory failure on 4 L of oxygen continuous, history of left breast cancer status post mastectomy, history of esophageal cancer recurrent right-sided pleural effusion requiring thoracentesis, most recently on 06/19/2022 who presented to the ER on 08/03/2022 for evaluation of worsening shortness of breath with associated chest tightness.    ED course and evaluation --- BP 135/99 with RR 29 with O2 sat 90% on increased O2 flow rate of 5 L/min.  Labs with troponin 15>>17, BNP 2935.  Treated with IV Lasix in the ED.  Chest x-ray showed -- Small right pleural effusion, increased since prior study.  Increasing right basilar consolidation, which may reflect airspace disease or progressive atelectasis.  Mass-like linear consolidation right suprahilar region, unchanged since recent CT.  Patient was admitted to hospitalist service and IV diuresis was continued.    IR was consulted for thoracentesis which was done on 10/5 with 900 cc fluid removed.   Hospital course complicated by: --Acute kidney injury after diuresis due to cardiorenal physiology.  Nephrology was consulted.   Renal function improved with diuretics held, but pt with persistent and worsening exertional dyspnea again. --Abdominal pain for which evaluation including abdominal xray and CT abdomen/pelvis was unremarkable. --Swallowing difficulties with severe esophageal dysmotility discovered on barium esophagram.    10/19.  Patient developed  worsening hypoxemia, currently on high flow oxygen.  We will place a Pleurx catheter as patient pleural effusion has reaccumulated, planned on Monday. Also restarted Lasix drip with milrinone after cardiology consult. 10/20.  Lasix discontinued as patient renal function is getting worse. 10/24.  Paracentesis performed, hemodialysis is planned.   10/25.  Called with blood pressure in the 60s.  Fluid bolus given.  Lasix and spironolactone discontinued.  We will start midodrine.  Yesterday with palliative care they did not want pressors.  Called patient's daughter with multiorgan failure and increasing oxygen requirements.  Case discussed with palliative care.  10/26 through 10/27 patient still has cardiogenic shock, still on heated high flow nasal cannula, still with acute kidney injury.  10/28.  Critical care team would like to remove the chest tube but patient does not want to remove chest tube at this time.  Increased risk of infection explained.  10/29.  Critical care team removed chest tube  10/30.  Patient with increased oxygen requirements on 61% FiO2 heated high flow nasal cannula.  10/31.  Patient complained of chest pain.  Cardiology did not want to do any aggressive management.  Morphine given.  Troponins with borderline elevation but flat.  Patient still on milrinone drip.  Fluid leaking out of the chest tube site.  Also has poor appetite.  He still on heated high flow nasal cannula.  As Dr. Jimmye Norman 11/1-11/7/23: Pt has made no progress this week. Pt is still requiring HFNC and cannot be weaned off. Milrinone was d/c on 08/16/22 as per cardio recs. Cardio has signed off as they do not have any further recommendations. Palliative care has had multiple discussions w/ the pt and pt's family in regards to comfort care only  but pt continues to refuse. I too have discussed comfort care w/ the pt.  Pt also refuses HD and a feeding tube. See previous progress/consult notes for more information.    08/23/2022.  Patient on heated high flow nasal cannula 100% FiO2 and 50 L flow with borderline saturations in the 80s.  Patient still wants to continue current management and does not want to do comfort care currently.  11/10.  In the afternoon patient using accessory muscles to breathe and asked to be made at peace.  Comfort measures ordered.  Starting Dilaudid drip.  Spoke with family on the phone.  Assessment and Plan: * End of life care Patient agreeable to making her at peace.  We will start comfort care measures and ordered Dilaudid drip.  Acute on chronic combined systolic and diastolic CHF (congestive heart failure) ([place]) Echo 01/30/22 -- EF 35-40%, grade II diastolic dysfunction, moderate MR, mod-severe TR, moderate AS.    Cardiogenic shock (HCC) Discontinued midodrine.  Acute on chronic respiratory failure with hypoxia (HCC) Baseline on 4 L/min continuous O2. This morning she was on heated high flow nasal cannula 100% oxygen and 60 L flow.  Patient with borderline saturations in the 80s.  Patient using accessory muscles to breathe this afternoon.  Making comfort care measures now.  Pleural effusion on right  Chart reviewed: prior thoracenteses done this year on 1/30, 2/8, 4/16, 5/12, 7/25, 8/8, 9/5 --Underwent thoracentesis on 10/5 with 900 cc fluid removed. -Critical care team removed the right chest tube on 08/12/2022.  Fluid will likely reaccumulate.  Critical care team does not recommend replacing chest tube.   Cardiorenal syndrome with renal failure Nephrology signed off because the patient does not want dialysis.  Last creatinine 5.09.  Paroxysmal atrial fibrillation (HCC) Discontinued medications  Acute kidney injury superimposed on CKD ([place]) 5.91 creatinine today   Protein-calorie malnutrition, severe (HCC) Failure to thrive.     Prolonged QT interval Discontinue telemetry  Malignant neoplasm of upper lobe of right lung Malignant neoplasm of lower third  of esophagus Patient is status post radiation therapy for esophageal cancer S/p radiation therapy for lung cancer   HLD (hyperlipidemia) Discontinued Lipitor  Abdominal pain Continue to monitor.  Anemia of chronic disease Last hemoglobin 10.1  Hypomagnesemia Comfort care measures  Chronic obstructive pulmonary disease (HCC) Severe COPD with chronic respiratory failure on 4 L of continuous O2 at home.   Depression Discontinue Celexa  Hyponatremia Comfort care measures  Carcinoma of overlapping sites of left breast in female, estrogen receptor positive ([place]) Made comfort care measures        Subjective: Patient seen this morning and again this afternoon.  This afternoon using accessory muscles to breathe.  States she wants to be made at peace.  Comfort care measures ordered and Dilaudid drip ordered.  Physical Exam: Vitals:   08/23/22 2353 08/24/22 0250 08/24/22 0411 08/24/22 0755  BP: 91/73   (!) 88/78  Pulse: (!) 108   (!) 121  Resp: 18   (!) 26  Temp: 97.8 F (36.6 C)   (!) 97.5 F (36.4 C)  TempSrc:      SpO2: (!) 86% (!) 84%  (!) 80%  Weight:   47.3 kg   Height:       Physical Exam HENT:     Head: Normocephalic.     Mouth/Throat:     Pharynx: No oropharyngeal exudate.  Eyes:     General: Lids are normal.     Conjunctiva/sclera: Conjunctivae normal.  Cardiovascular:     Rate and Rhythm: Regular rhythm. Tachycardia present.     Heart sounds: Normal heart sounds, S1 normal and S2 normal.  Pulmonary:     Effort: Accessory muscle usage present.     Breath sounds: Examination of the right-middle field reveals decreased breath sounds and rhonchi. Examination of the right-lower field reveals decreased breath sounds and rhonchi. Examination of the left-lower field reveals decreased breath sounds and rhonchi. Decreased breath sounds and rhonchi present. No wheezing or rales.  Abdominal:     Palpations: Abdomen is soft.     Tenderness: There is no abdominal  tenderness.  Musculoskeletal:     Right lower leg: Swelling present.     Left lower leg: Swelling present.  Skin:    General: Skin is warm.     Findings: No rash.  Neurological:     Mental Status: She is alert and oriented to person, place, and time.     Data Reviewed: Creatinine 5.91, potassium 5.4, sodium 132, hemoglobin 10.2  Family Communication: Dated daughter on phone that we are starting comfort care measures.  Disposition: Status is: Inpatient Remains inpatient appropriate because: Starting end-of-life care with comfort care measures and Dilaudid drip.  Planned Discharge Destination: Patient will be in the hospital.    Time spent: 32 minutes  Author: Loletha Grayer, MD 08/24/2022 1:16 PM  For on call review [email or web address].

## 2022-08-24 NOTE — Assessment & Plan Note (Signed)
Patient started on comfort care measures in the afternoon on 08/24/2022.  Patient was on Dilaudid drip.  Patient passed away at 10:06 AM on 11/18/21.

## 2022-08-25 DIAGNOSIS — J90 Pleural effusion, not elsewhere classified: Secondary | ICD-10-CM | POA: Diagnosis not present

## 2022-08-25 DIAGNOSIS — I5043 Acute on chronic combined systolic (congestive) and diastolic (congestive) heart failure: Secondary | ICD-10-CM | POA: Diagnosis not present

## 2022-08-25 DIAGNOSIS — Z515 Encounter for palliative care: Secondary | ICD-10-CM | POA: Diagnosis not present

## 2022-08-25 DIAGNOSIS — J9621 Acute and chronic respiratory failure with hypoxia: Secondary | ICD-10-CM | POA: Diagnosis not present

## 2022-09-04 ENCOUNTER — Ambulatory Visit: Payer: Medicare Other | Admitting: Family

## 2022-09-13 ENCOUNTER — Other Ambulatory Visit: Payer: Self-pay

## 2022-09-14 NOTE — Progress Notes (Signed)
Progress Note   Patient: Danielle [name] VOH:[id number] DOB: 11-26-1955 DOA: 07/19/2022     38 DOS: the patient was seen and examined on 08-29-22   Brief hospital course: Danielle [name] is a 66 y.o. female with medical history significant for Paroxysmal A-fib on chronic anticoagulation therapy, history of chronic systolic heart failure, history of adenocarcinoma of the right lung status post radiation therapy, history of stage IIIb chronic kidney disease, aortic valve stenosis, COPD with chronic respiratory failure on 4 L of oxygen continuous, history of left breast cancer status post mastectomy, history of esophageal cancer recurrent right-sided pleural effusion requiring thoracentesis, most recently on 06/19/2022 who presented to the ER on 07/23/2022 for evaluation of worsening shortness of breath with associated chest tightness.    ED course and evaluation --- BP 135/99 with RR 29 with O2 sat 90% on increased O2 flow rate of 5 L/min.  Labs with troponin 15>>17, BNP 2935.  Treated with IV Lasix in the ED.  Chest x-ray showed -- Small right pleural effusion, increased since prior study.  Increasing right basilar consolidation, which may reflect airspace disease or progressive atelectasis.  Mass-like linear consolidation right suprahilar region, unchanged since recent CT.  Patient was admitted to hospitalist service and IV diuresis was continued.    IR was consulted for thoracentesis which was done on 10/5 with 900 cc fluid removed.   Hospital course complicated by: --Acute kidney injury after diuresis due to cardiorenal physiology.  Nephrology was consulted.   Renal function improved with diuretics held, but pt with persistent and worsening exertional dyspnea again. --Abdominal pain for which evaluation including abdominal xray and CT abdomen/pelvis was unremarkable. --Swallowing difficulties with severe esophageal dysmotility discovered on barium esophagram.    10/19.  Patient developed  worsening hypoxemia, currently on high flow oxygen.  We will place a Pleurx catheter as patient pleural effusion has reaccumulated, planned on Monday. Also restarted Lasix drip with milrinone after cardiology consult. 10/20.  Lasix discontinued as patient renal function is getting worse. 10/24.  Paracentesis performed, hemodialysis is planned.   10/25.  Called with blood pressure in the 60s.  Fluid bolus given.  Lasix and spironolactone discontinued.  We will start midodrine.  Yesterday with palliative care they did not want pressors.  Called patient's daughter with multiorgan failure and increasing oxygen requirements.  Case discussed with palliative care.  10/26 through 10/27 patient still has cardiogenic shock, still on heated high flow nasal cannula, still with acute kidney injury.  10/28.  Critical care team would like to remove the chest tube but patient does not want to remove chest tube at this time.  Increased risk of infection explained.  10/29.  Critical care team removed chest tube  10/30.  Patient with increased oxygen requirements on 61% FiO2 heated high flow nasal cannula.  10/31.  Patient complained of chest pain.  Cardiology did not want to do any aggressive management.  Morphine given.  Troponins with borderline elevation but flat.  Patient still on milrinone drip.  Fluid leaking out of the chest tube site.  Also has poor appetite.  He still on heated high flow nasal cannula.  As Dr. Jimmye [name] 11/1-11/7/23: Pt has made no progress this week. Pt is still requiring HFNC and cannot be weaned off. Milrinone was d/c on 08/16/22 as per cardio recs. Cardio has signed off as they do not have any further recommendations. Palliative care has had multiple discussions w/ the pt and pt's family in regards to comfort care only  but pt continues to refuse. I too have discussed comfort care w/ the pt.  Pt also refuses HD and a feeding tube. See previous progress/consult notes for more information.    08/23/2022.  Patient on heated high flow nasal cannula 100% FiO2 and 50 L flow with borderline saturations in the 80s.  Patient still wants to continue current management and does not want to do comfort care currently.  11/10.  In the afternoon patient using accessory muscles to breathe and asked to be made at peace.  Comfort measures ordered.  Starting Dilaudid drip.  Spoke with family on the phone.  11/11.  Continue dilaudid drip for comfort care measures.  SBP in the 60's  Assessment and Plan: * End of life care Patient agreeable to making her at peace.  Continue comfort care measures and Dilaudid drip.  Acute on chronic combined systolic and diastolic CHF (congestive heart failure) ([place]) Echo 01/30/22 -- EF 35-40%, grade II diastolic dysfunction, moderate MR, mod-severe TR, moderate AS.    Cardiogenic shock (HCC) Discontinued midodrine.  BP in the 60's this am  Acute on chronic respiratory failure with hypoxia (HCC) Baseline on 4 L/min continuous O2. This morning she was on heated high flow nasal cannula 100% oxygen and 60 L flow.  Patient with borderline saturations in the 80s.  Patient using accessory muscles to breathe this afternoon.  Contniue comfort care measures.  Pleural effusion on right  Chart reviewed: prior thoracenteses done this year on 1/30, 2/8, 4/16, 5/12, 7/25, 8/8, 9/5 --Underwent thoracentesis on 10/5 with 900 cc fluid removed. -Critical care team removed the right chest tube on 08/12/2022.  Fluid will likely reaccumulate.  Critical care team does not recommend replacing chest tube.   Cardiorenal syndrome with renal failure Nephrology signed off because the patient does not want dialysis.  Last creatinine 5.09.  Paroxysmal atrial fibrillation (HCC) Discontinued medications  Acute kidney injury superimposed on CKD ([place]) 5.91 creatinine today   Protein-calorie malnutrition, severe (HCC) Failure to thrive.     Prolonged QT interval Discontinue  telemetry  Malignant neoplasm of upper lobe of right lung Malignant neoplasm of lower third of esophagus Patient is status post radiation therapy for esophageal cancer S/p radiation therapy for lung cancer   HLD (hyperlipidemia) Discontinued Lipitor  Abdominal pain Continue to monitor.  Anemia of chronic disease Last hemoglobin 10.1  Hypomagnesemia Comfort care measures  Chronic obstructive pulmonary disease (HCC) Severe COPD with chronic respiratory failure on 4 L of continuous O2 at home.   Depression Discontinued Celexa  Hyponatremia Comfort care measures  Carcinoma of overlapping sites of left breast in female, estrogen receptor positive ([place]) Made comfort care measures        Subjective: Patient able to shake her head to a few questions.  Admitted 37 days ago with respiratory distress and recurrent pleural effusion. Noticed some brown discoloration on gown- Possible vomit versus food.  Physical Exam: Vitals:   08/24/22 0411 08/24/22 0755 Sep 13, 2022 0019 09-13-22 0823  BP:  (!) 88/78 (!) 87/64 (!) 62/50  Pulse:  (!) 121 85 85  Resp:  (!) 26 20 14   Temp:  (!) 97.5 F (36.4 C) (!) 97.5 F (36.4 C) (!) 96.2 F (35.7 C)  TempSrc:      SpO2:  (!) 80% (!) 76% (!) 73%  Weight: 47.3 kg     Height:       Physical Exam HENT:     Head: Normocephalic.     Mouth/Throat:  Pharynx: No oropharyngeal exudate.  Eyes:     General: Lids are normal.     Conjunctiva/sclera: Conjunctivae normal.  Cardiovascular:     Rate and Rhythm: Regular rhythm. Tachycardia present.     Heart sounds: Normal heart sounds, S1 normal and S2 normal.  Pulmonary:     Effort: Bradypnea present.     Breath sounds: Examination of the right-middle field reveals decreased breath sounds and rhonchi. Examination of the right-lower field reveals decreased breath sounds and rhonchi. Examination of the left-lower field reveals decreased breath sounds and rhonchi. Decreased breath sounds and  rhonchi present. No wheezing or rales.  Abdominal:     Palpations: Abdomen is soft.     Tenderness: There is no abdominal tenderness.  Musculoskeletal:     Right lower leg: Swelling present.     Left lower leg: Swelling present.  Skin:    General: Skin is warm.     Findings: No rash.  Neurological:     Mental Status: She is alert and oriented to person, place, and time.     Data Reviewed: No new data today  Family Communication: spoke with Patient daughter on the phone  Disposition: Status is: Inpatient Remains inpatient appropriate because: on dilaudid drip for comfort care measures  Planned Discharge Destination:  patient will stay in hospital    Time spent: 26 minutes Case discussed with nursing staff  Author: Loletha Grayer, MD 09-17-22 8:25 AM  For on call review [email or web address].

## 2022-09-14 NOTE — Death Summary Note (Signed)
DEATH SUMMARY   Patient Details  Name: Danielle [name] MRN: [id number] DOB: 1955/12/18 QIH:[id number], Danielle [name], [name] Admission/Discharge Information   Admit Date:  08/14/22  Date of Death: Date of Death: Sep 21, 2022  Time of Death: Time of Death: 01/18/1005  Length of Stay: January 18, 2037   Principle Cause of death: Acute on chronic combined systolic and diastolic congestive heart failure.  Hospital Diagnoses: Principal Problem:   End of life care Active Problems:   Acute on chronic combined systolic and diastolic CHF (congestive heart failure) (HCC)   Acute on chronic respiratory failure with hypoxia (HCC)   Cardiogenic shock (HCC)   Pleural effusion on right   Cardiorenal syndrome with renal failure   Acute kidney injury superimposed on CKD (HCC)   Paroxysmal atrial fibrillation (HCC)   Protein-calorie malnutrition, severe (HCC)   Carcinoma of overlapping sites of left breast in female, estrogen receptor positive (HCC)   Hyponatremia   Depression   Chronic obstructive pulmonary disease (HCC)   Hypomagnesemia   Anemia of chronic disease   Abdominal pain   HLD (hyperlipidemia)   Pleural effusion   Malignant neoplasm of upper lobe of right lung   Prolonged QT interval   Moderate aortic stenosis   Moderate tricuspid regurgitation by prior echocardiogram   Hospital Course: Danielle [name] is a 66 y.o. female with medical history significant for Paroxysmal A-fib on chronic anticoagulation therapy, history of chronic systolic heart failure, history of adenocarcinoma of the right lung status post radiation therapy, history of stage IIIb chronic kidney disease, aortic valve stenosis, COPD with chronic respiratory failure on 4 L of oxygen continuous, history of left breast cancer status post mastectomy, history of esophageal cancer recurrent right-sided pleural effusion requiring thoracentesis, most recently on 06/19/2022 who presented to the ER on 14-Aug-2022 for evaluation of worsening shortness of breath  with associated chest tightness.    ED course and evaluation --- BP 135/99 with RR 29 with O2 sat 90% on increased O2 flow rate of 5 L/min.  Labs with troponin 15>>17, BNP 01/18/34.  Treated with IV Lasix in the ED.  Chest x-ray showed -- Small right pleural effusion, increased since prior study.  Increasing right basilar consolidation, which may reflect airspace disease or progressive atelectasis.  Mass-like linear consolidation right suprahilar region, unchanged since recent CT.  Patient was admitted to hospitalist service and IV diuresis was continued.    IR was consulted for thoracentesis which was done on 10/5 with 900 cc fluid removed.   Hospital course complicated by: --Acute kidney injury after diuresis due to cardiorenal physiology.  Nephrology was consulted.   Renal function improved with diuretics held, but pt with persistent and worsening exertional dyspnea again. --Abdominal pain for which evaluation including abdominal xray and CT abdomen/pelvis was unremarkable. --Swallowing difficulties with severe esophageal dysmotility discovered on barium esophagram.    10/19.  Patient developed worsening hypoxemia, currently on high flow oxygen.  We will place a Pleurx catheter as patient pleural effusion has reaccumulated, planned on Monday. Also restarted Lasix drip with milrinone after cardiology consult. 10/20.  Lasix discontinued as patient renal function is getting worse. 10/24.  Paracentesis performed, hemodialysis is planned.   10/25.  Called with blood pressure in the 60s.  Fluid bolus given.  Lasix and spironolactone discontinued.  We will start midodrine.  Yesterday with palliative care they did not want pressors.  Called patient's daughter with multiorgan failure and increasing oxygen requirements.  Case discussed with palliative care.  10/26 through 10/27 patient still  has cardiogenic shock, still on heated high flow nasal cannula, still with acute kidney injury.  10/28.   Critical care team would like to remove the chest tube but patient does not want to remove chest tube at this time.  Increased risk of infection explained.  10/29.  Critical care team removed chest tube  10/30.  Patient with increased oxygen requirements on 61% FiO2 heated high flow nasal cannula.  10/31.  Patient complained of chest pain.  Cardiology did not want to do any aggressive management.  Morphine given.  Troponins with borderline elevation but flat.  Patient still on milrinone drip.  Fluid leaking out of the chest tube site.  Also has poor appetite.  He still on heated high flow nasal cannula.  As Dr. Jimmye Norman 11/1-11/7/23: Pt has made no progress this week. Pt is still requiring HFNC and cannot be weaned off. Milrinone was d/c on 08/16/22 as per cardio recs. Cardio has signed off as they do not have any further recommendations. Palliative care has had multiple discussions w/ the pt and pt's family in regards to comfort care only but pt continues to refuse. I too have discussed comfort care w/ the pt.  Pt also refuses HD and a feeding tube. See previous progress/consult notes for more information.   08/23/2022.  Patient on heated high flow nasal cannula 100% FiO2 and 50 L flow with borderline saturations in the 80s.  Patient still wants to continue current management and does not want to do comfort care currently.  11/10.  In the afternoon patient using accessory muscles to breathe and asked to be made at peace.  Comfort measures ordered.  Starting Dilaudid drip.  Spoke with family on the phone.  11/11.  Continue dilaudid drip for comfort care measures.  SBP in the 60's.    Patient passed away at 10:06 AM on Dilaudid drip and comfort care measures.  Assessment and Plan: * End of life care Patient started on comfort care measures in the afternoon on 08/24/2022.  Patient was on Dilaudid drip.  Patient passed away at 10:06 AM on November 12, 2021.  Acute on chronic combined systolic and diastolic  CHF (congestive heart failure) ([place]) Echo 01/30/22 -- EF 35-40%, grade II diastolic dysfunction, moderate MR, mod-severe TR, moderate AS.    Cardiogenic shock (HCC) BP in the 60's this am  Acute on chronic respiratory failure with hypoxia (HCC) Baseline on 4 L/min continuous O2. This morning she was on heated high flow nasal cannula 100% oxygen and 60 L flow.  Patient with borderline saturations in the 80s.   Cardiorenal syndrome with renal failure Last creatinine 5.91  Paroxysmal atrial fibrillation (HCC) Discontinued medications  Acute kidney injury superimposed on CKD ([place]) Last creatinine 5.91   Protein-calorie malnutrition, severe (HCC) Failure to thrive.     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  Anemia of chronic disease Last hemoglobin 10.1  Hypomagnesemia Comfort care measures  Chronic obstructive pulmonary disease (HCC) Severe COPD with chronic respiratory failure on 4 L of continuous O2 at home.   Depression Discontinued Celexa  Hyponatremia Comfort care measures  Carcinoma of overlapping sites of left breast in female, estrogen receptor positive ([place]) Noted         Procedures: Thoracentesis, chest tube placement  and removal  Consultations: Critical care specialist, cardiologist, nephrologist, interventional radiologist  The results of significant diagnostics from this hospitalization (including imaging, microbiology, ancillary and laboratory) are listed below for reference.   Significant Diagnostic Studies: DG Chest Port 1 View  Result Date: 08/13/2022 CLINICAL DATA:  Pleural effusion EXAM: PORTABLE CHEST 1 VIEW COMPARISON:  Previous studies including  the examination of 08/07/2022 FINDINGS: Transverse diameter of heart is increased. Central pulmonary vessels are prominent. There is interval removal of right chest tube. There is faint haziness in the right lower lung fields suggesting small residual right pleural effusion. Left lateral CP angle is clear. There is no pneumothorax. Linear densities in the apices may suggest scarring. IMPRESSION: Interval removal of right chest tube. There is haziness in the right lower lung fields suggesting small residual right pleural effusion. There is no pneumothorax. There are no new focal infiltrates. Electronically Signed   By: Elmer Picker M.D.   On: 08/13/2022 09:49   DG Chest Port 1 View  Result Date: 08/07/2022 CLINICAL DATA:  Status post thoracostomy tube placement on right EXAM: PORTABLE CHEST 1 VIEW COMPARISON:  08/02/2022 FINDINGS: Cardiac enlargement and aortic atherosclerosis. Right pigtail thoracostomy tube overlies the right lower lung. Decreased volume of the right pleural fluid collection. No pneumothorax identified. Similar appearance of scar versus atelectasis within the right upper lobe. Mild diffuse increase interstitial markings are noted bilaterally which may reflect superimposed edema versus infection. IMPRESSION: 1. Decreased volume of right pleural fluid collection status post thoracostomy tube placement. No pneumothorax. 2. Mild diffuse increase interstitial markings compatible with superimposed edema versus infection. Electronically Signed   By: Kerby Moors M.D.   On: 08/07/2022 09:50   DG Chest Port 1 View  Result Date: 08/02/2022 CLINICAL DATA:  Shortness of breath. EXAM: PORTABLE CHEST 1 VIEW COMPARISON:  Chest radiograph dated 08/01/2022. FINDINGS: Diffuse interstitial prominence may represent edema or developing infiltrate. Similar appearance of right pleural effusion and right lung base atelectasis or infiltrate. There is no pneumothorax. Stable cardiac silhouette.  Atherosclerotic calcification of the aorta. No acute osseous pathology. IMPRESSION: Similar appearance of right pleural effusion and right lung base atelectasis or infiltrate. Electronically Signed   By: Anner Crete M.D.   On: 08/02/2022 00:36   CT CHEST WO CONTRAST  Result Date: 08/01/2022 CLINICAL DATA:  Chronic dyspnea. EXAM: CT CHEST WITHOUT CONTRAST TECHNIQUE: Multidetector CT imaging of the chest was performed following the standard protocol without IV contrast. RADIATION DOSE REDUCTION: This exam was performed according to the departmental dose-optimization program which includes automated exposure control, adjustment of the mA and/or kV according to patient size and/or use of iterative reconstruction technique. COMPARISON:  Recent chest CT 07/20/2022 FINDINGS: Cardiovascular: The heart is within normal limits in size and stable. Stable advanced atherosclerotic calcification involving the aorta and coronary arteries. Mediastinum/Nodes: Stable scattered mediastinal and hilar lymph nodes but no mass or overt adenopathy. The esophagus is grossly normal. Lungs/Pleura: Persistent and slightly larger right pleural effusion with overlying atelectasis. Stable probable post treatment changes involving the right upper lobe with dense areas of masslike fibrotic changes. Recommend clinical correlation. Stable severe underlying emphysematous changes and pulmonary scarring. Stable 8 mm superior segment left lower lobe pulmonary nodule on image number 66/3. This has increased in size since the prior study from May 2023 and worrisome for a metastatic focus. Upper Abdomen: No significant upper abdominal findings. Stable severe vascular disease. Stable left hepatic lobe cyst. Musculoskeletal: Status post left mastectomy. No axillary adenopathy. The thyroid gland is  grossly normal. The bony thorax is intact. IMPRESSION: 1. Persistent and slightly larger right pleural effusion with overlying atelectasis. 2. Stable  probable post treatment changes involving the right upper lobe with dense areas of masslike fibrotic changes. 3. Stable severe emphysematous changes and pulmonary scarring. 4. 8 mm superior segment left lower lobe pulmonary nodule enlarged since May of 2023 and worrisome for a metastatic focus. 5. Stable advanced atherosclerotic calcification involving the aorta and coronary arteries. 6. Aortic atherosclerosis. Aortic Atherosclerosis (ICD10-I70.0) and Emphysema (ICD10-J43.9). Electronically Signed   By: Marijo Sanes M.D.   On: 08/01/2022 15:46   DG Chest 2 View  Result Date: 08/01/2022 CLINICAL DATA:  Acute on chronic congestive heart failure. EXAM: CHEST - 2 VIEW COMPARISON:  July 30, 2022. FINDINGS: Mild cardiomegaly is noted. Mild to moderate right pleural effusion is noted with associated atelectasis. Left lung is unremarkable. Bony thorax is unremarkable. IMPRESSION: Mild to moderate right pleural effusion is noted with associated atelectasis. Aortic Atherosclerosis (ICD10-I70.0). Electronically Signed   By: Marijo Conception M.D.   On: 08/01/2022 15:46   DG Chest Port 1 View  Result Date: 07/30/2022 CLINICAL DATA:  Shortness of breath. Pleural effusion. EXAM: PORTABLE CHEST 1 VIEW COMPARISON:  07/29/2022 FINDINGS: Stable enlarged cardiac silhouette, right pleural effusion, mildly prominent interstitial markings, right upper lung zone linear atelectasis or scarring and ill-defined opacity in the right lower lung zone. IMPRESSION: 1. Stable cardiomegaly and mild interstitial lung disease with possible superimposed mild interstitial pulmonary edema. 2. Stable right pleural effusion. 3. Stable right upper lung zone linear atelectasis or scarring and increased density in the right lower lung zone due to the pleural effusion with possible atelectasis or pneumonia. Electronically Signed   By: Claudie Revering M.D.   On: 07/30/2022 11:41   DG Chest Port 1 View  Result Date: 07/29/2022 CLINICAL DATA:   Dyspnea EXAM: PORTABLE CHEST 1 VIEW COMPARISON:  07/28/2022 FINDINGS: Unchanged appearance of right hilum and parahilar masslike opacity. Mild interstitial opacity without overt edema. Cardiomediastinal contours are normal. Suspect layering right basilar effusion. IMPRESSION: 1. Unchanged appearance of right hilum and parahilar masslike opacity. 2. Suspect layering right basilar effusion. Electronically Signed   By: Ulyses Jarred M.D.   On: 07/29/2022 21:07   DG Chest Port 1 View  Result Date: 07/28/2022 CLINICAL DATA:  Shortness of breath, COPD EXAM: PORTABLE CHEST 1 VIEW COMPARISON:  07/26/2022 FINDINGS: Unchanged AP portable examination. Cardiomegaly with a small right pleural effusion and perihilar masslike consolidation. Mild, diffuse bilateral interstitial pulmonary opacity. Osseous structures unremarkable. IMPRESSION: 1. Unchanged AP portable examination. Cardiomegaly with a small right pleural effusion and perihilar masslike consolidation. 2. Mild, diffuse bilateral interstitial pulmonary opacity, likely edema. No new or focal airspace opacity. Electronically Signed   By: Delanna Ahmadi M.D.   On: 07/28/2022 12:22   DG Chest Port 1 View  Result Date: 07/26/2022 CLINICAL DATA:  Pleural effusion, shortness of breath EXAM: PORTABLE CHEST 1 VIEW COMPARISON:  07/23/2022 FINDINGS: Right pleural effusion is similar to prior study. Right lower lobe airspace opacities also stable. No confluent opacity on the left. Heart is normal size. Aortic atherosclerosis. No acute bony abnormality. IMPRESSION: Stable small right pleural effusion with right lower lobe airspace opacities. Electronically Signed   By: Rolm Baptise M.D.   On: 07/26/2022 14:26    Microbiology: No results found for this or any previous visit (from the past 240 hour(s)).  Time spent: 35 minutes Spoke with family at the bedside after patient passing.  Signed:  Loletha Grayer, MD 07-Sep-2022

## 2022-09-14 NOTE — Progress Notes (Signed)
Chaplain responded to page from nurse that patient had passed, and family was on the way. Chaplain offered compassionate presence and offered support. Family is expecting more members to come to say goodbye. Family was welcoming of chaplain visit Chaplain offered prayer and will return later to check in. Please reach out if any needs arise.

## 2022-09-14 NOTE — Progress Notes (Signed)
Time of death 22. Verified with this RN and Georgina Quint, RN. Daughter notified and coming to the bedside.

## 2022-09-14 DEATH — deceased

## 2022-11-05 ENCOUNTER — Other Ambulatory Visit: Payer: Medicare Other

## 2022-11-05 ENCOUNTER — Ambulatory Visit: Payer: Medicare Other | Admitting: Internal Medicine

## 2022-11-05 ENCOUNTER — Ambulatory Visit: Payer: Medicare Other

## 2023-01-27 IMAGING — MG MM DIGITAL SCREENING UNILAT*R* W/ TOMO W/ CAD
4 series · 4 of 12 positions shown · non-contrast
Comparison: Previous exam(s).

CLINICAL DATA: Screening.

EXAM:
DIGITAL SCREENING UNILATERAL RIGHT MAMMOGRAM WITH CAD AND
TOMOSYNTHESIS
TECHNIQUE: Right screening digital craniocaudal and mediolateral oblique
mammograms were obtained. Right screening digital breast
tomosynthesis was performed. The images were evaluated with
computer-aided detection.

[R CC synth-2D]
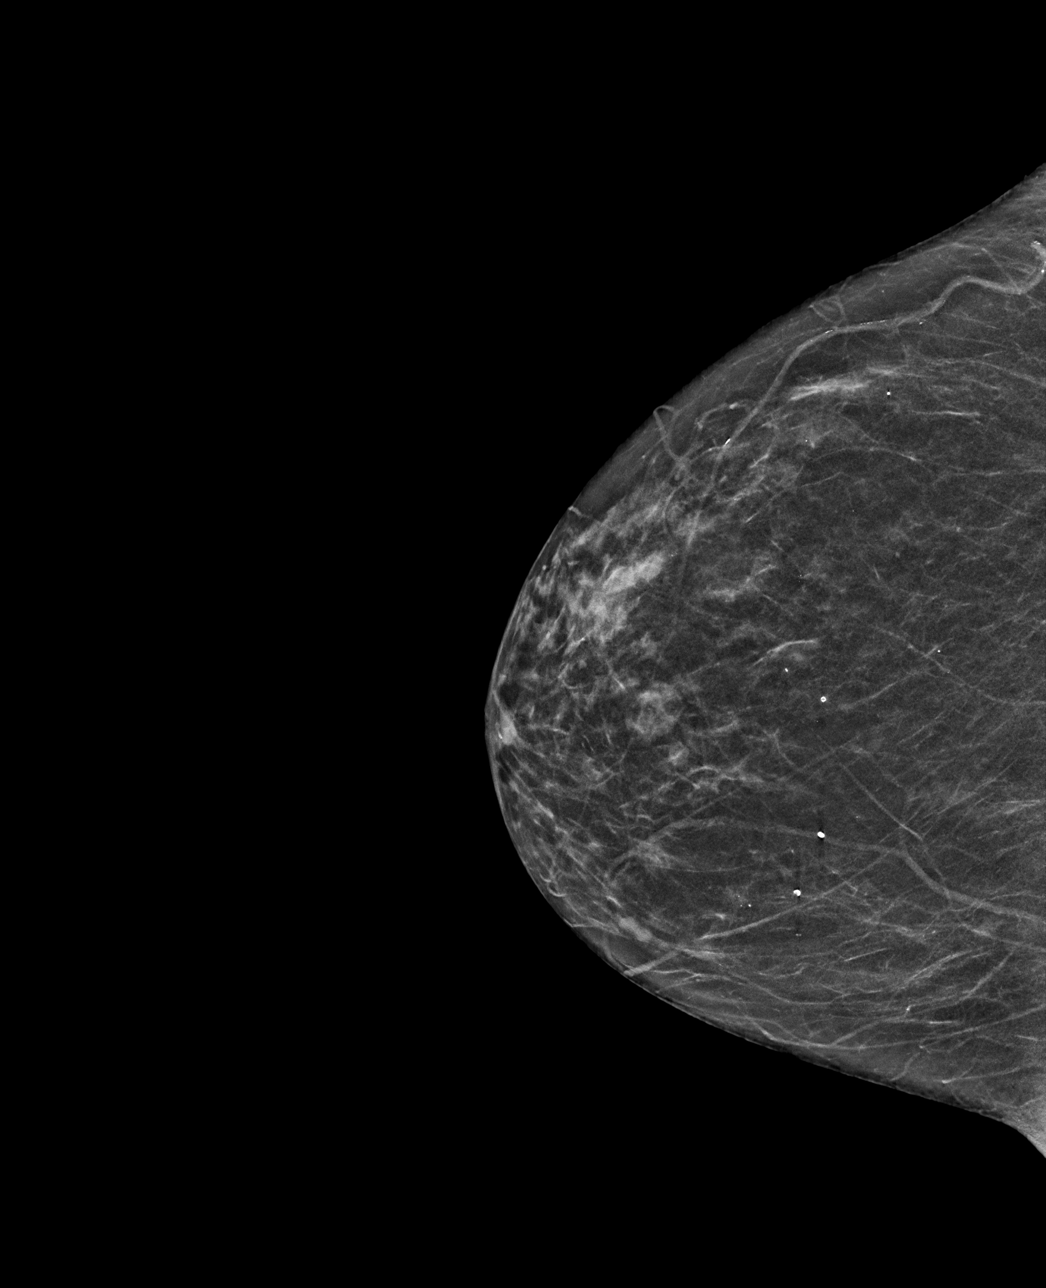

[R MLO synth-2D]
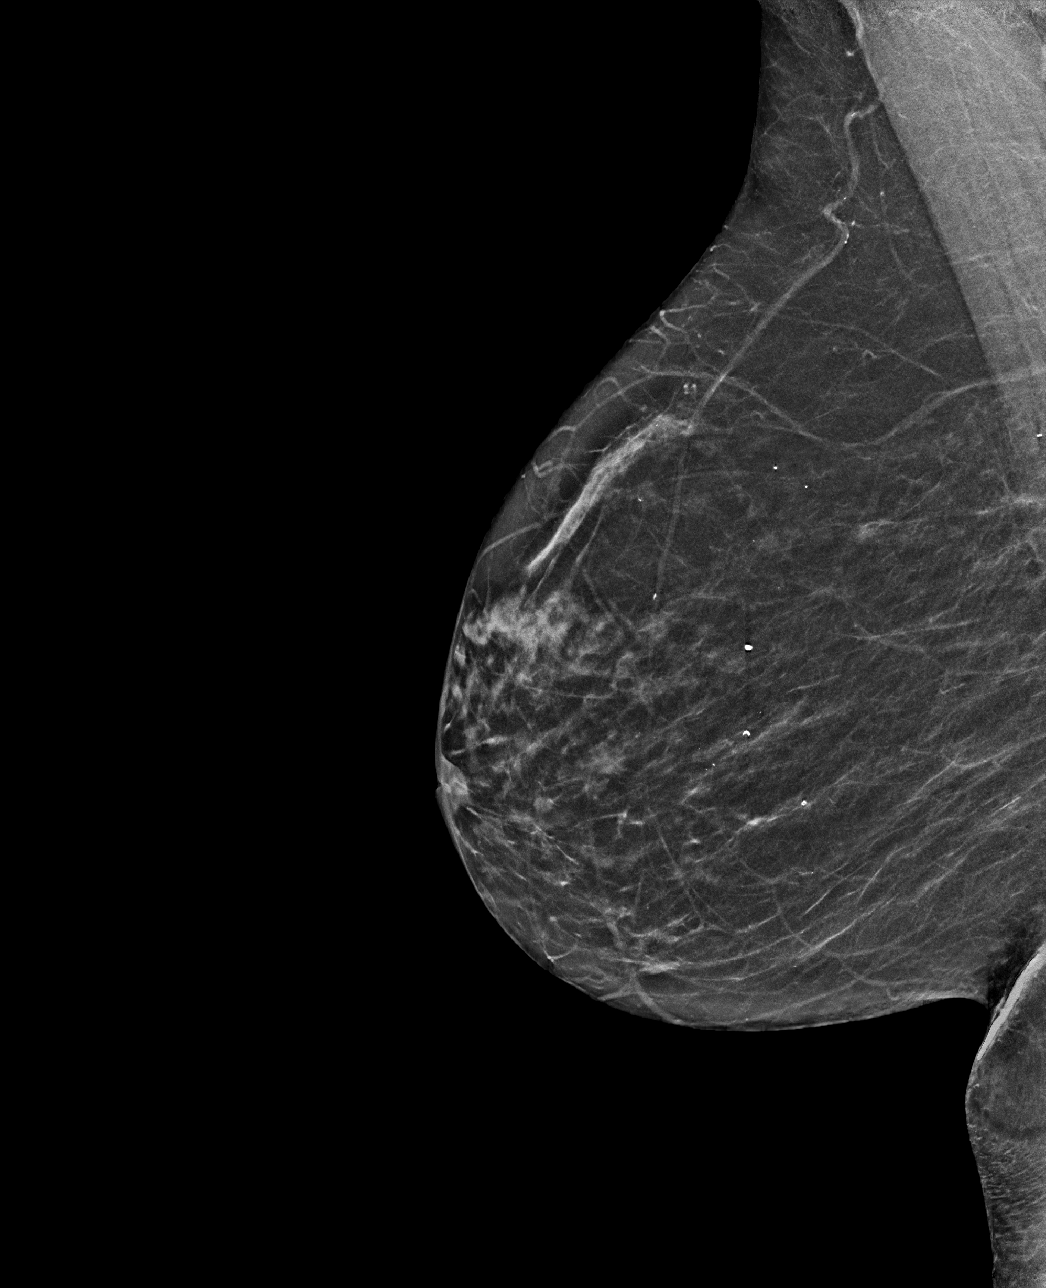

[R CC tomo · tomo slice 25/48.0]
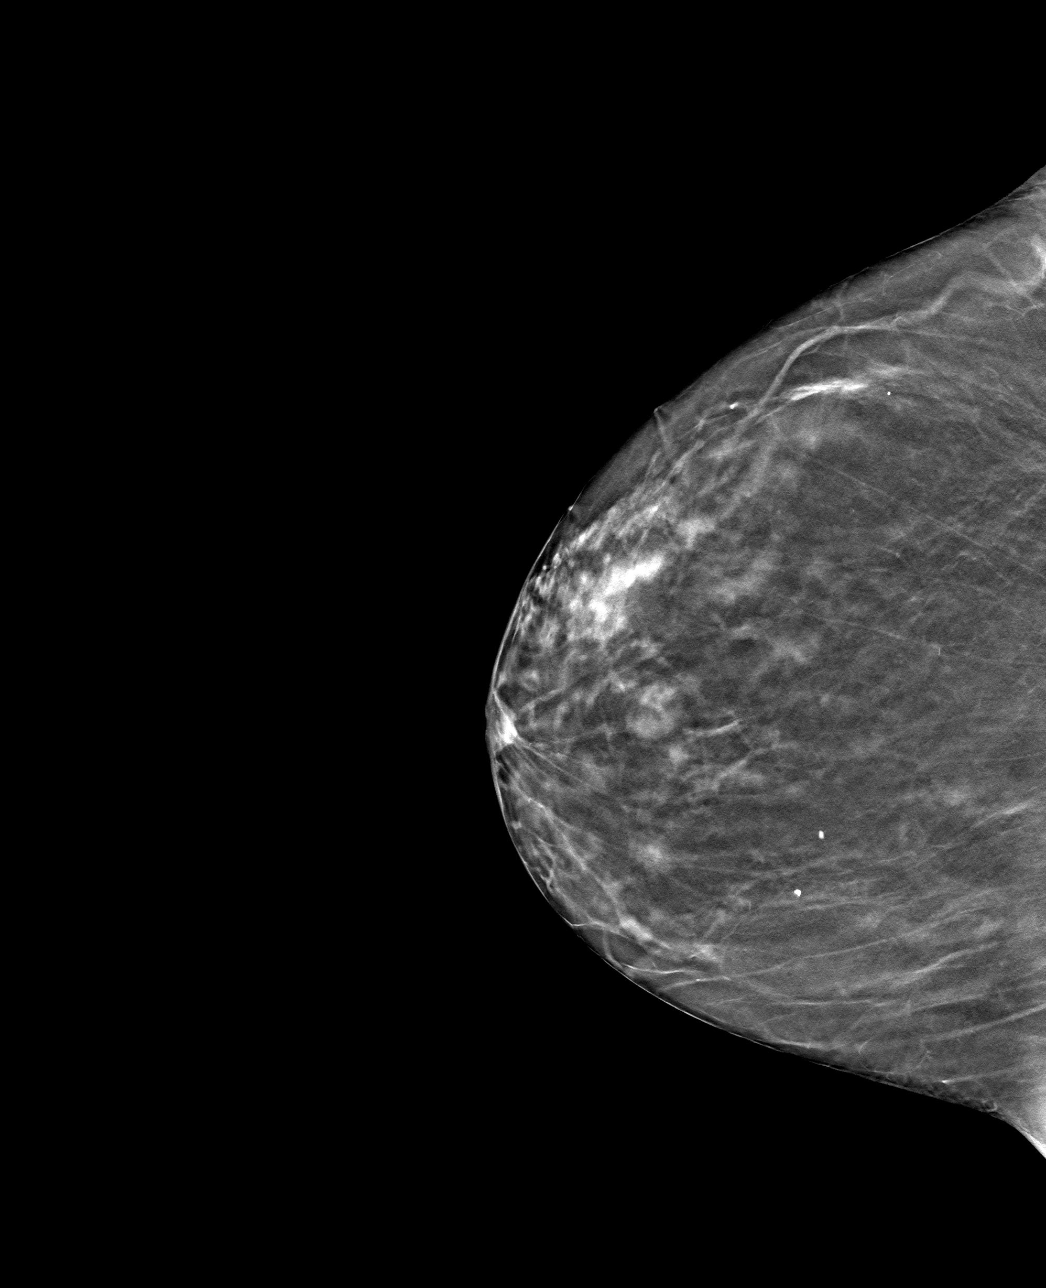

[R MLO tomo · tomo slice 26/51.0]
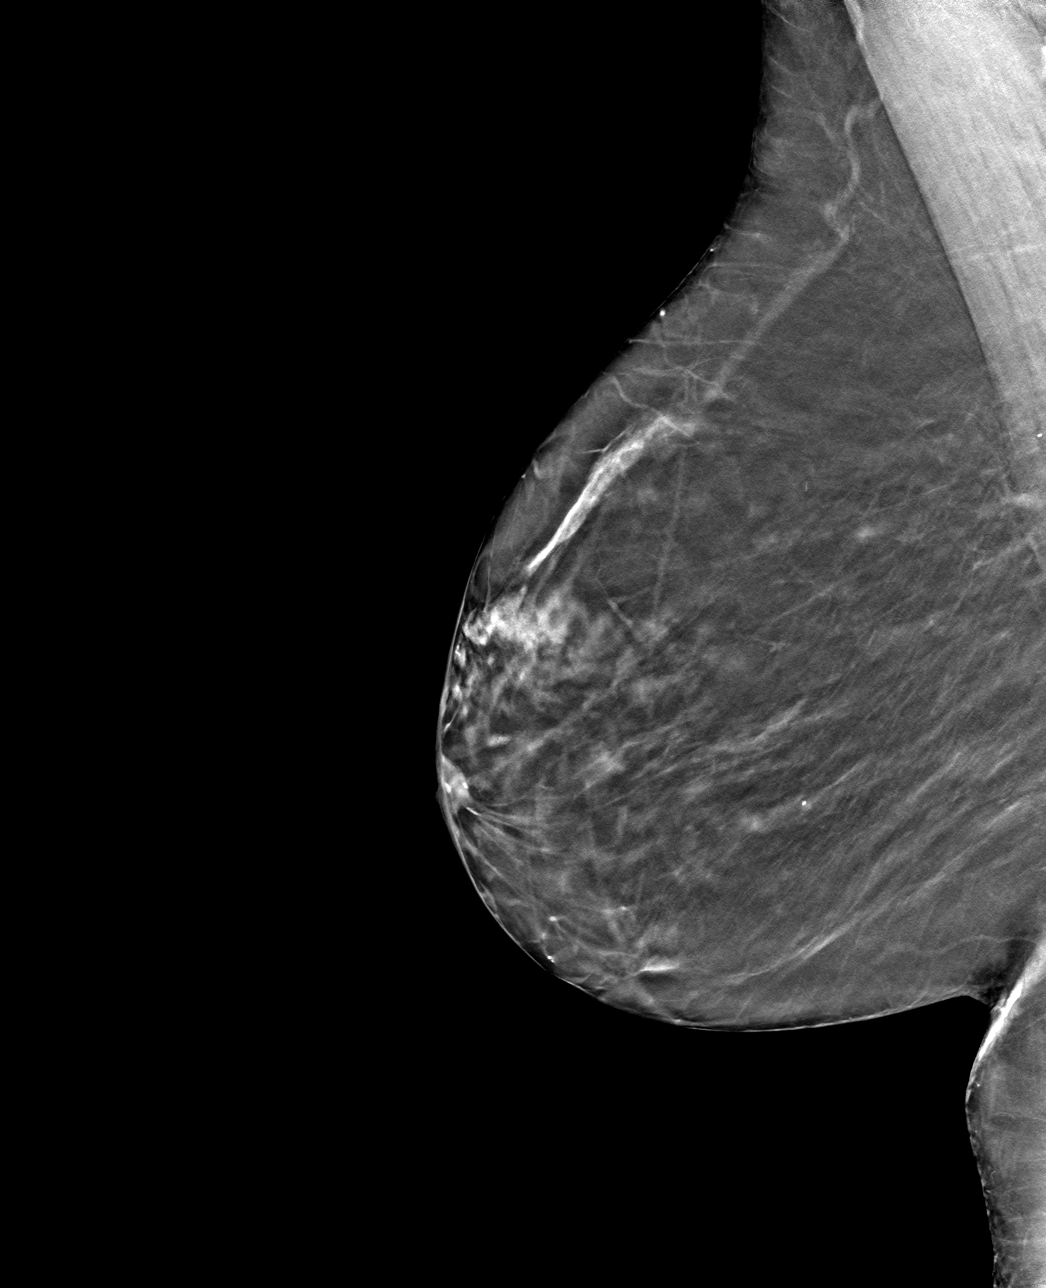

[4 of 12 positions shown; findings below may reference images not displayed]

ACR Breast Density Category b: There are scattered areas of
fibroglandular density.
FINDINGS: The patient has had a left mastectomy. There are no findings
suspicious for malignancy.
IMPRESSION: No mammographic evidence of malignancy. A result letter of this
screening mammogram will be mailed directly to the patient.

RECOMMENDATION:
Screening mammogram in one year.  (Code:NT-E-EGT)

BI-RADS CATEGORY  1: Negative.

## 2023-03-12 IMAGING — US US RENAL
2 series · 14 of 25 positions shown · non-contrast
Comparison: CT 11/05/2020

CLINICAL DATA: Acute kidney injury

EXAM:
RENAL / URINARY TRACT ULTRASOUND COMPLETE

[Series 1: us renal · 8 of 39 slices shown (1 of 2)]
[im 1/39]
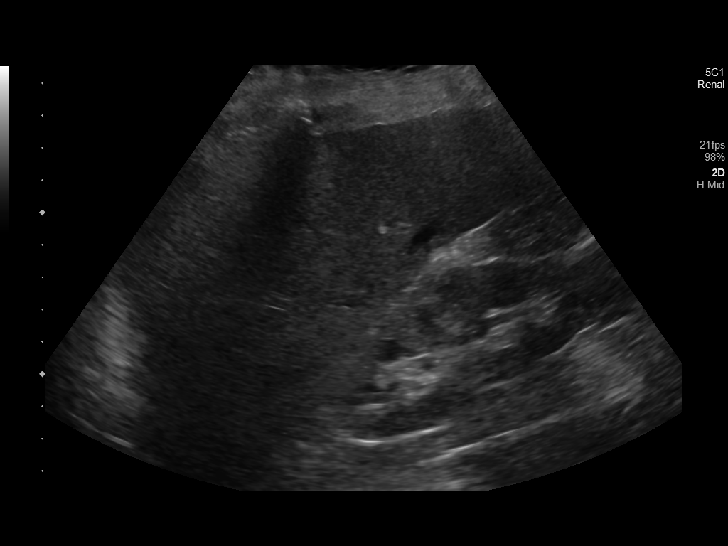
[im 6/39]
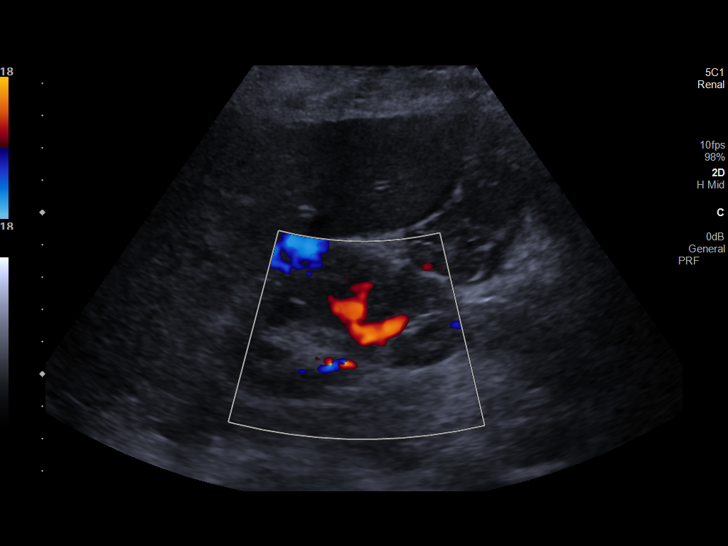
[im 11/39]
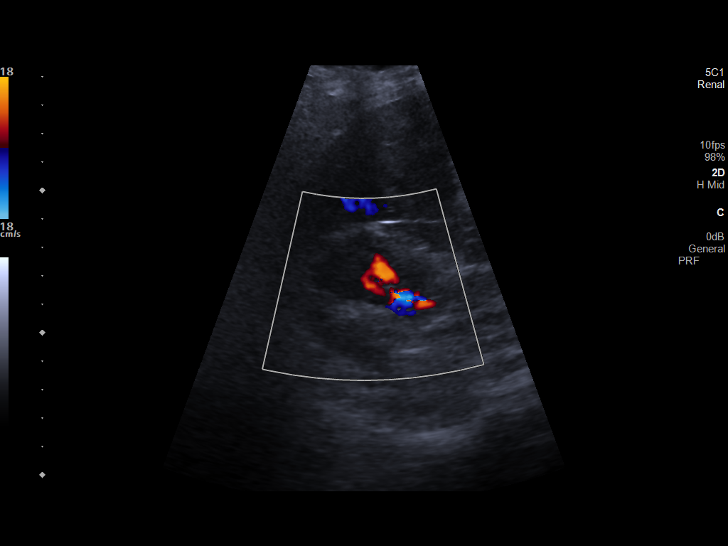
[im 17/39]
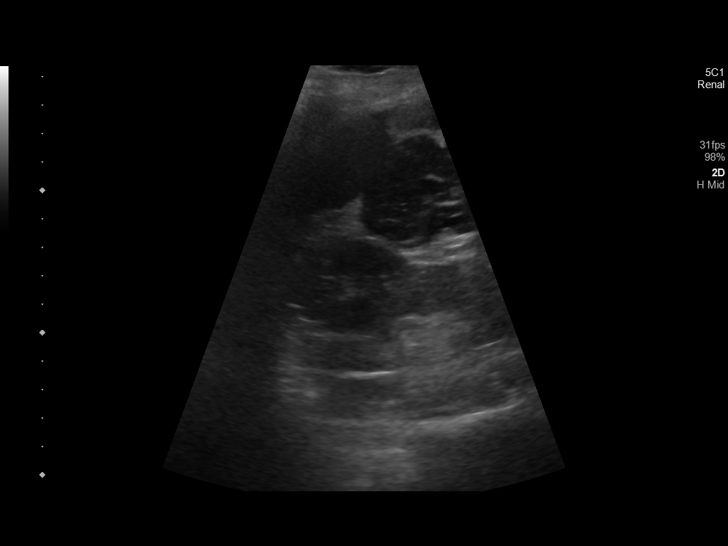
[im 22/39]
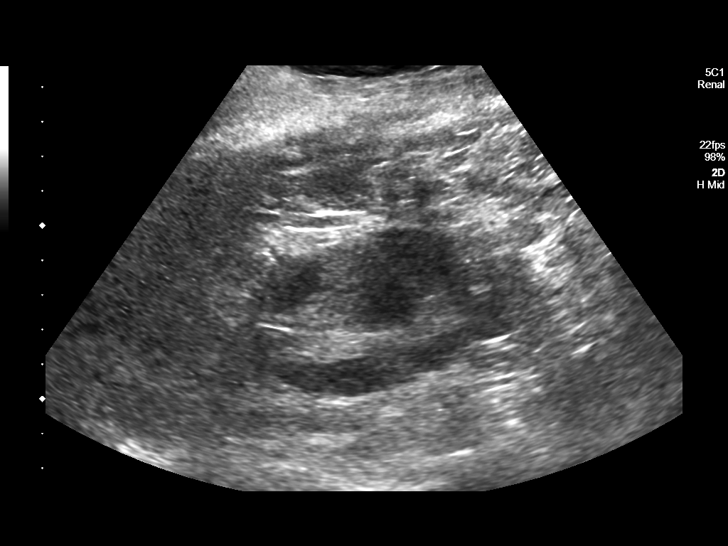
[im 25/39]
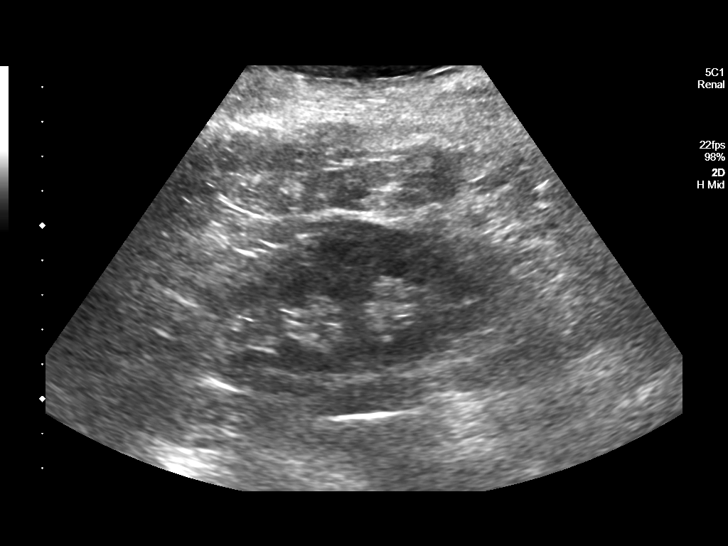
[im 30/39]
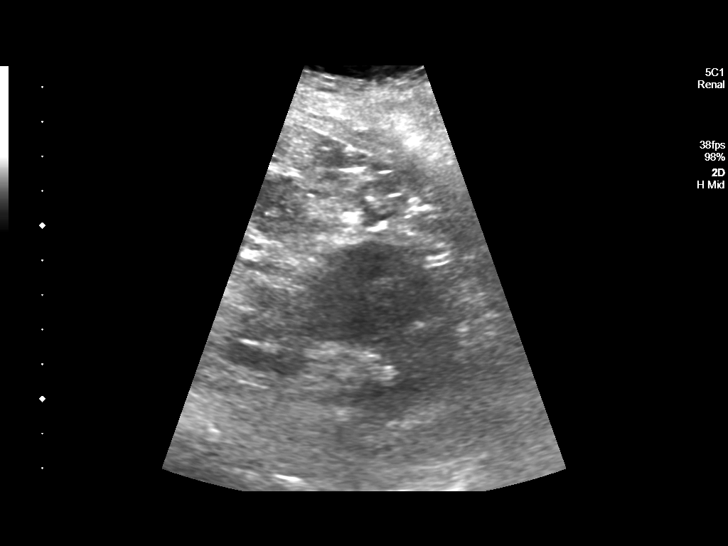
[im 36/39]
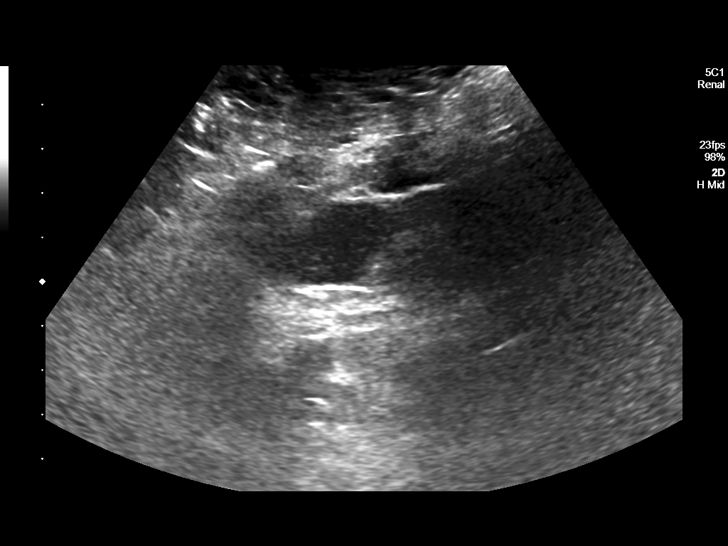

[Series 1001: us renal · 6 of 28 slices shown (2 of 2)]
[im 1/28]
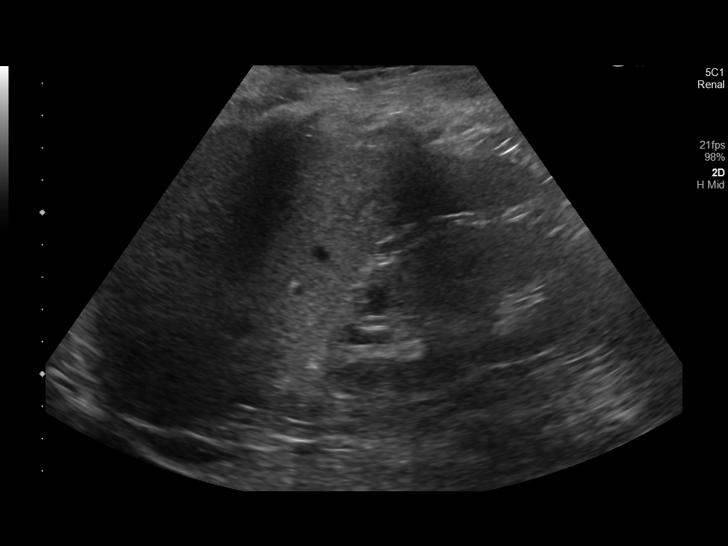
[im 4/28]
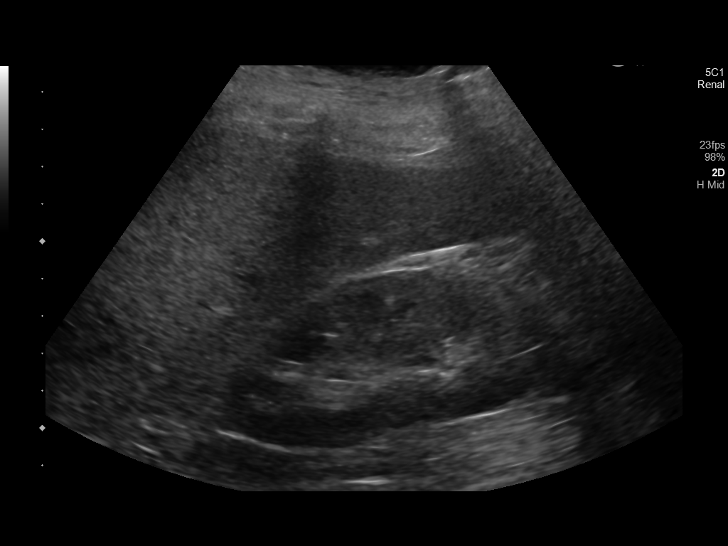
[im 10/28]
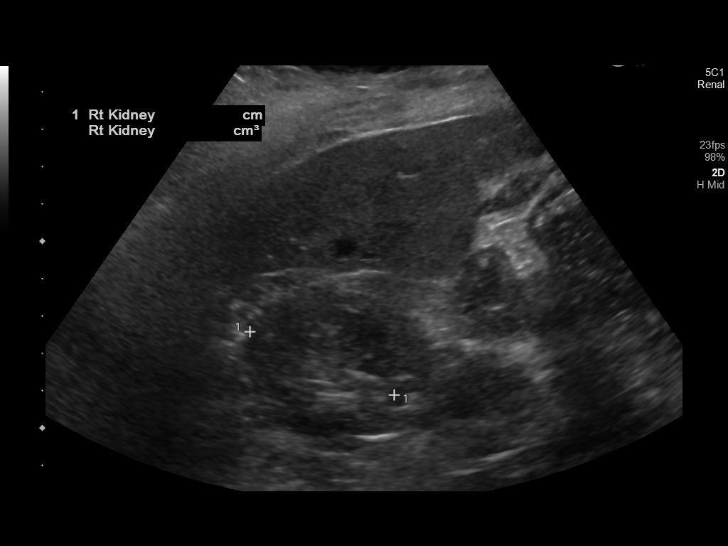
[im 16/28]
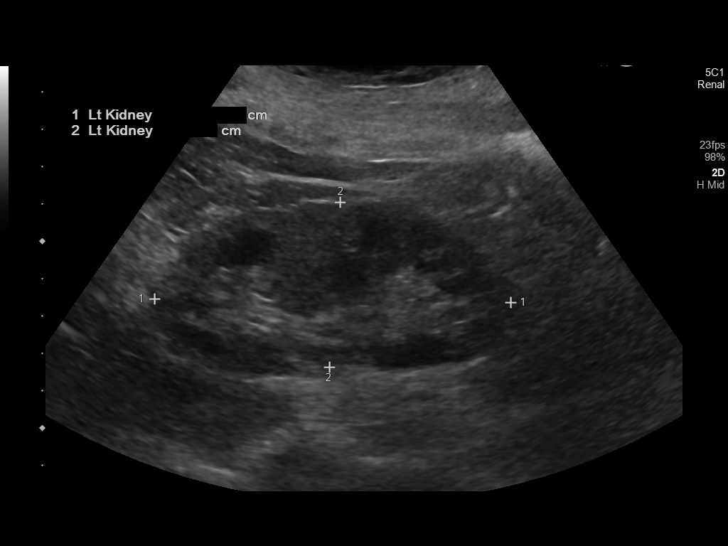
[im 22/28]
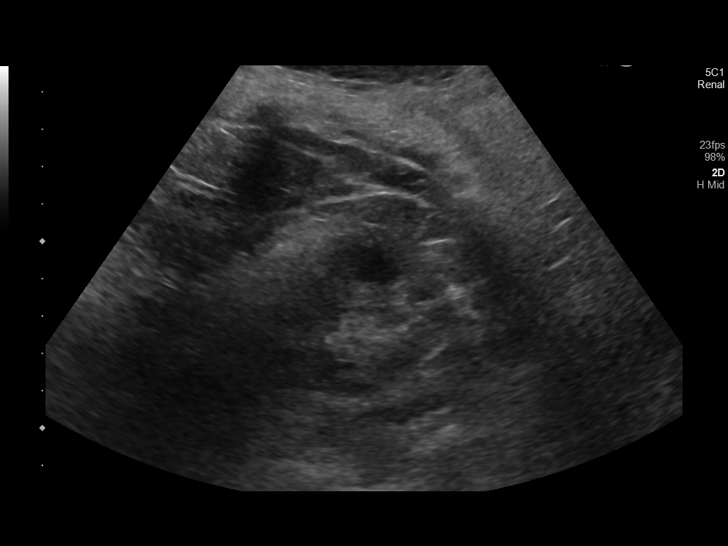
[im 28/28]
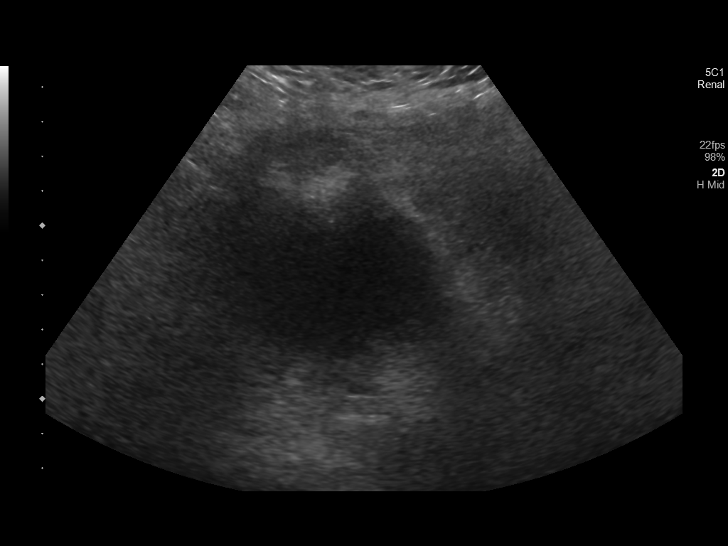

[14 of 25 positions shown; findings below may reference images not displayed]

FINDINGS: Right Kidney:

Renal measurements: 8.9 x 4.1 x 4.2 cm = volume: 79.8 mL. Cortex
slightly echogenic. No mass or hydronephrosis

Left Kidney:

Renal measurements: 7.5 x 4.4 x 4 cm = volume: 88.5 mL. Cortex
slightly echogenic. No mass or hydronephrosis.

Bladder:

Appears normal for degree of bladder distention.

Other:

None.
IMPRESSION: Slight increased cortical echogenicity suggesting medical renal
disease. No hydronephrosis.

## 2023-03-13 IMAGING — DX DG ABDOMEN 1V
2 series · 2 of 2 positions shown · non-contrast
Comparison: 11/05/2020

CLINICAL DATA: Hypotension history of ileostomy take down

EXAM:
ABDOMEN - 1 VIEW

[abdomen supine (1 of 2)]
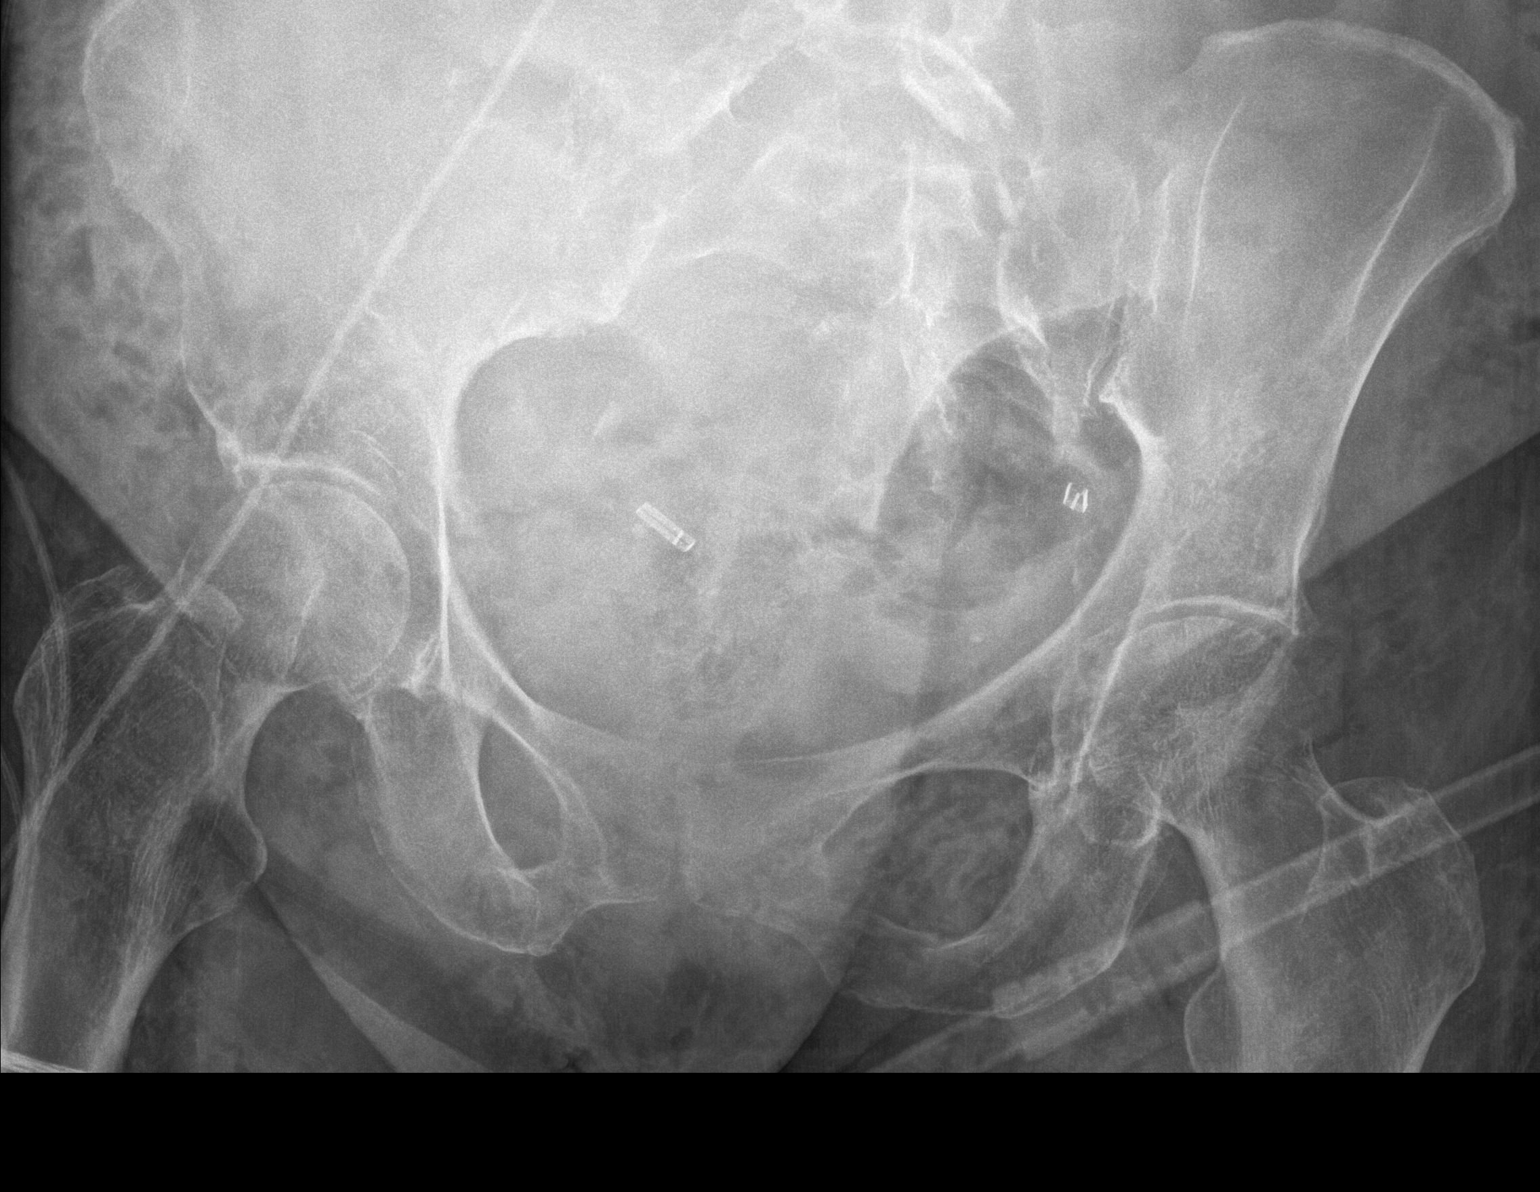

[abdomen supine (2 of 2)]
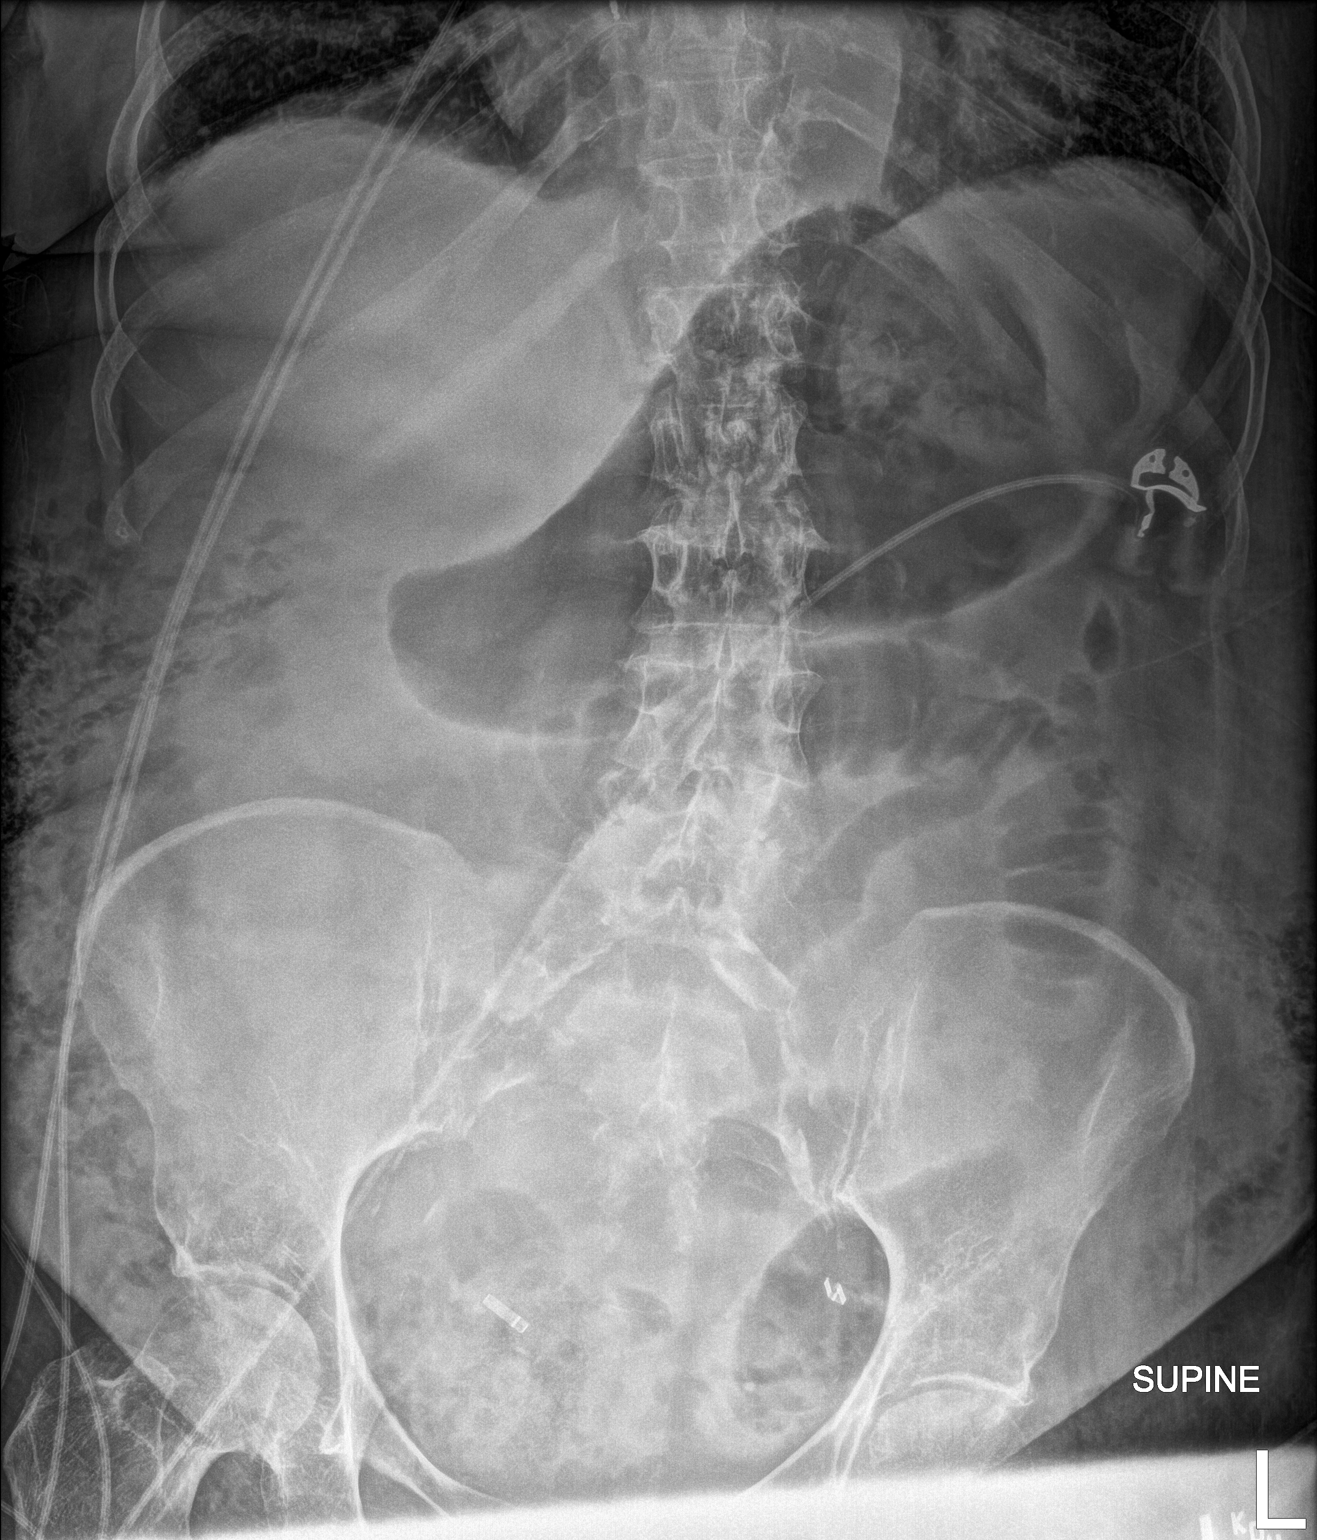

[2 of 2 positions shown; findings below may reference images not displayed]

FINDINGS: Clips in the pelvis. Moderate air distension of the stomach.
Air-filled dilated small bowel up to 3.5 cm in the central abdomen.
Extensive mottled appearance of the abdominal soft tissues
presumably due to soft tissue emphysema related to recent
laparoscopic procedure.
IMPRESSION: 1. Air distension of stomach and small bowel either due to ileus or
possible obstruction.
2. Diffuse mottled appearance of soft tissues felt consistent with
soft tissue emphysema likely due to recent laparoscopic procedure

## 2023-03-13 IMAGING — DX DG CHEST 1V
1 series · 1 of 1 positions shown · non-contrast
Comparison: 11/16/2020, 06/05/2020, PET CT 12/01/2019, CT chest
11/06/2019, 11/06/2019

CLINICAL DATA: Hypotension

EXAM:
CHEST  1 VIEW

[chest ap]
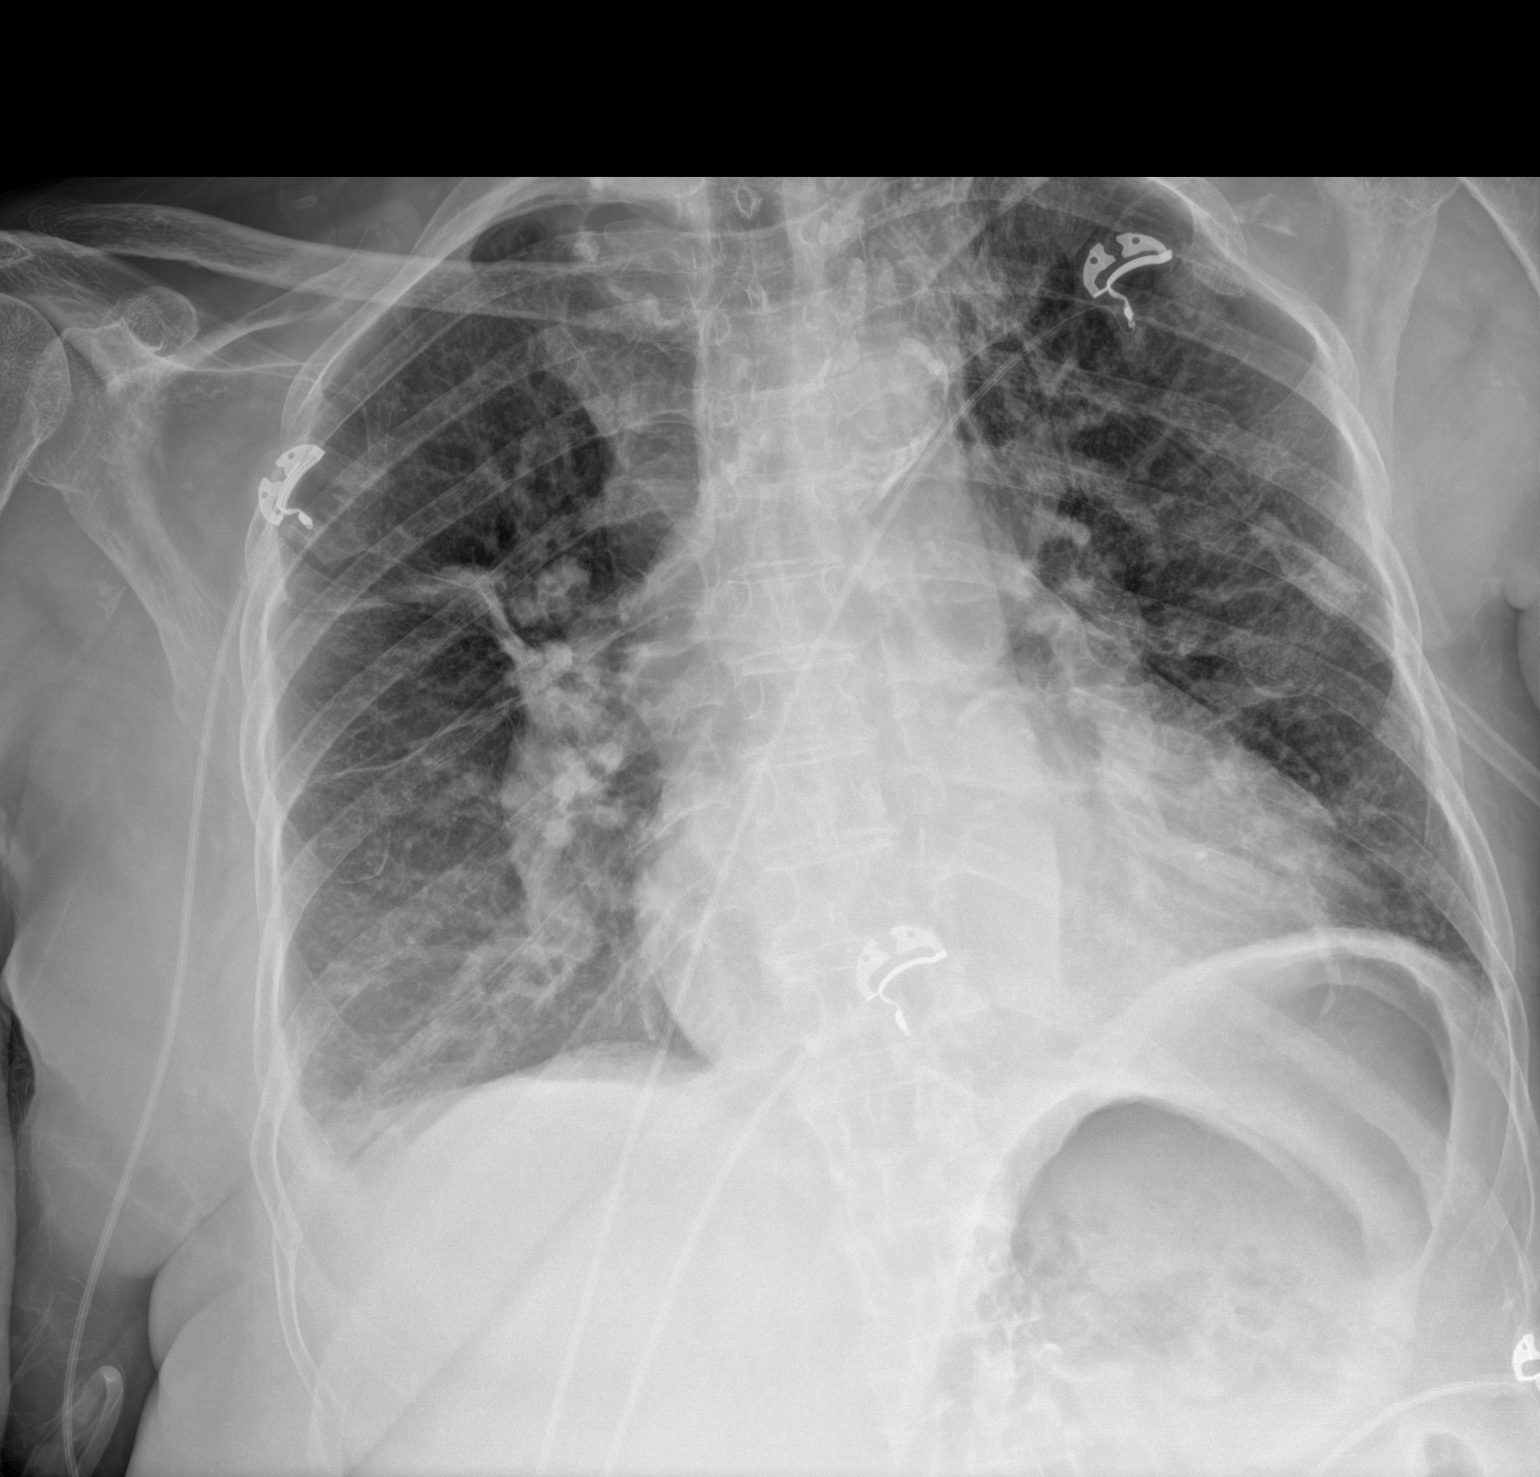

[1 of 1 positions shown; findings below may reference images not displayed]

FINDINGS: Mild cardiomegaly with central vascular congestion. Small pleural
effusions and diffuse increased interstitial opacity likely due to
edema. Aortic atherosclerosis. No pneumothorax.

Irregular opacity with mild distortion and nodularity in the right
upper lobe and right hilus similar compared to intermittent previous
exams and presumably due to scarring.
IMPRESSION: 1. Cardiomegaly with vascular congestion, small pleural effusions
and mild diffuse interstitial edema
2. Irregular and nodular opacities in the right hilus and upper lobe
present on previous exams and probably due to scarring

## 2023-03-14 IMAGING — CT CT ABD-PELV W/O CM
2 of 4 series · 14 of 46 positions shown, 16 images · non-contrast
Comparison: Abdominopelvic CT 11/05/2020.  Radiographs 10/26/2021.

CLINICAL DATA: Postop day 4 from ileostomy reversal. Vomiting.
Evaluate for anastomotic leak.



[Series 3: routine abd/pel wo · axial · 0.91mm/px · z∈[-750,-335]mm · 11 of 99 slices shown, 13 images]
[im 8/99  soft-tissue]
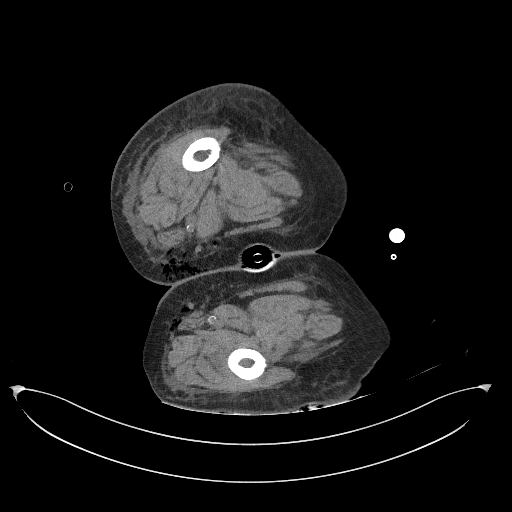
[im 8/99  bone]
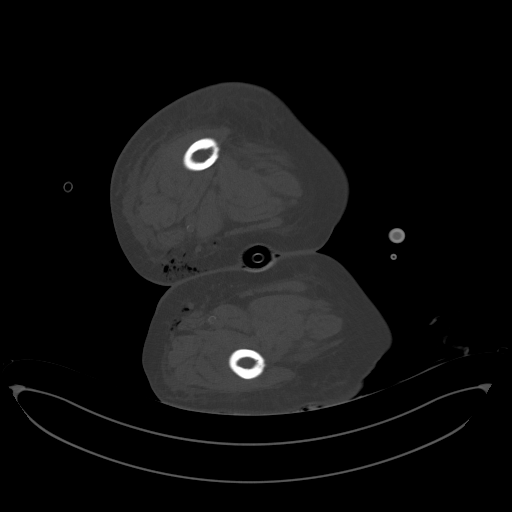
[im 16/99  soft-tissue]
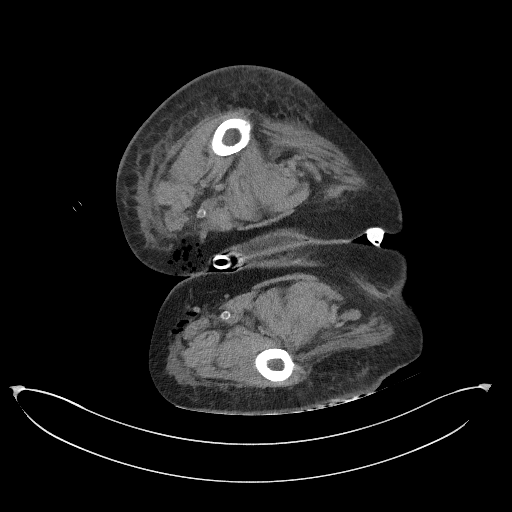
[im 24/99  soft-tissue]
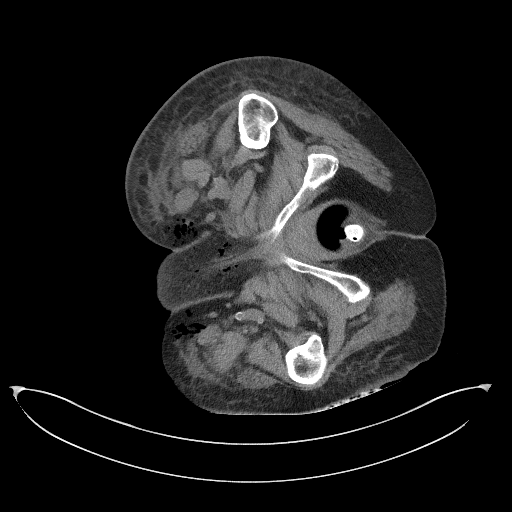
[im 32/99  soft-tissue]
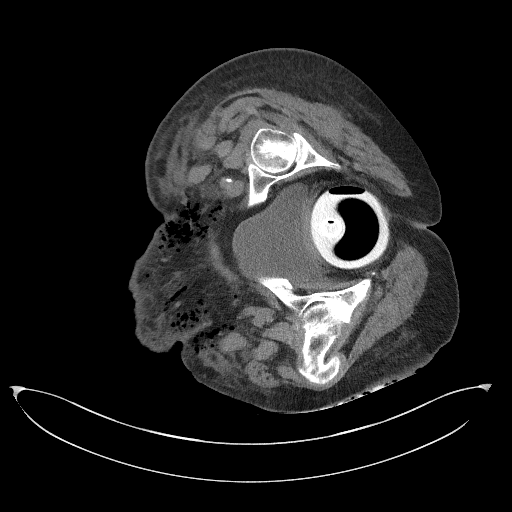
[im 40/99  soft-tissue]
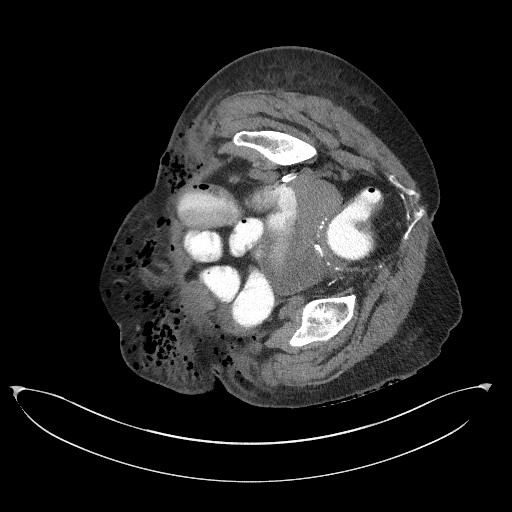
[im 51/99  soft-tissue]
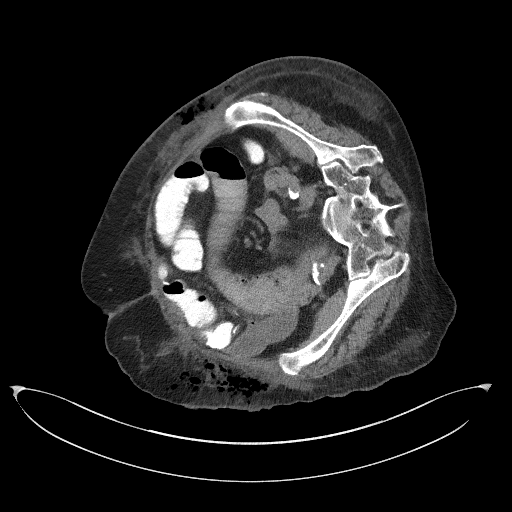
[im 59/99  soft-tissue]
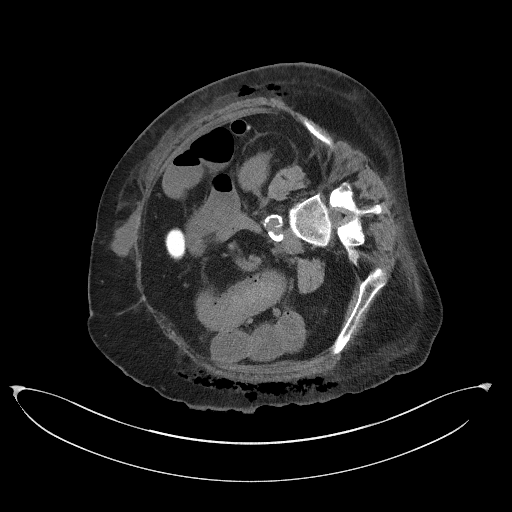
[im 67/99  soft-tissue]
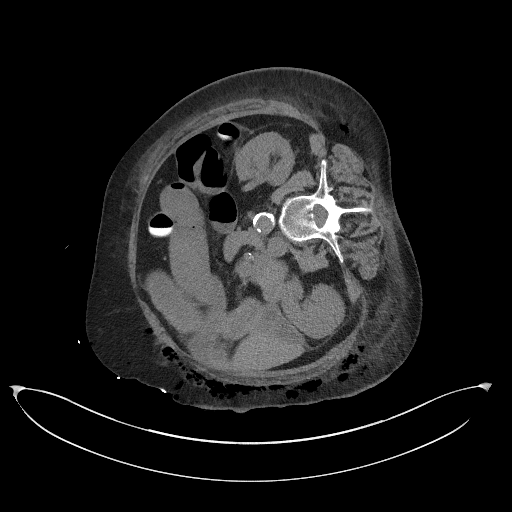
[im 75/99  soft-tissue]
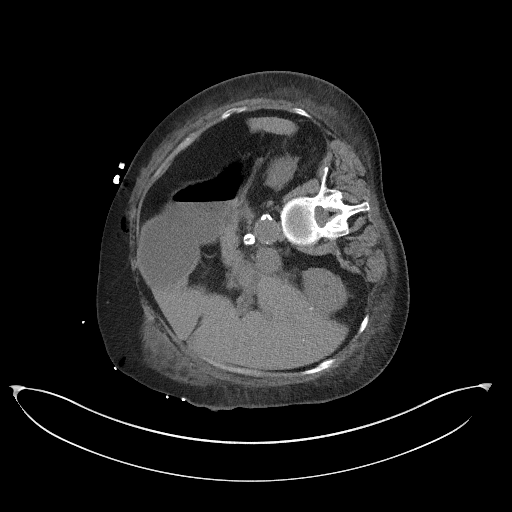
[im 75/99  bone]
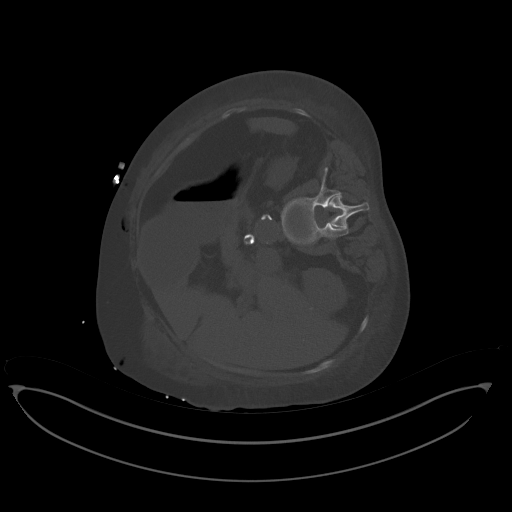
[im 83/99  soft-tissue]
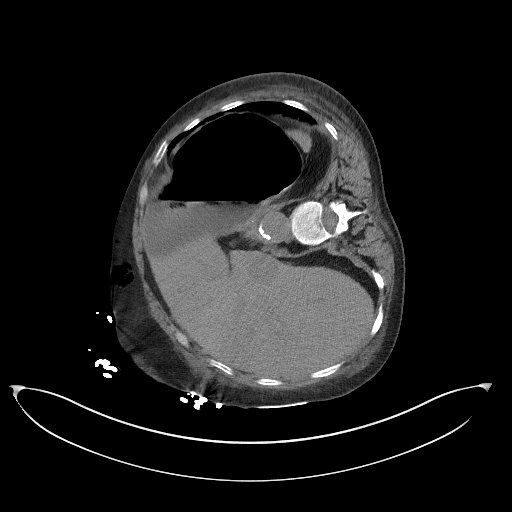
[im 91/99  soft-tissue]
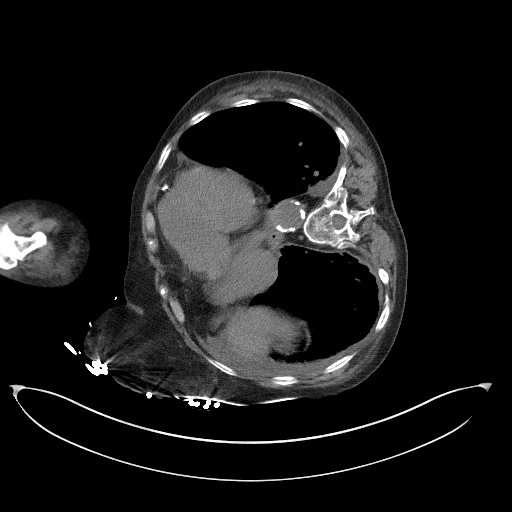

[Series 6: coronal st · coronal · 0.82mm/px · 3 of 117 slices shown]
[im 39/117  soft-tissue]
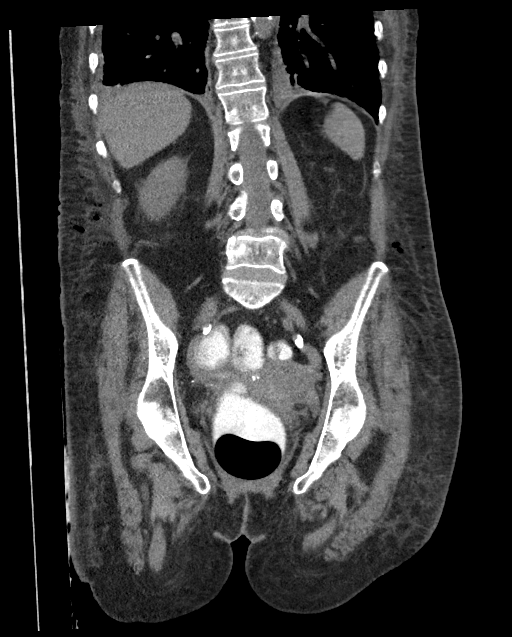
[im 52/117  soft-tissue]
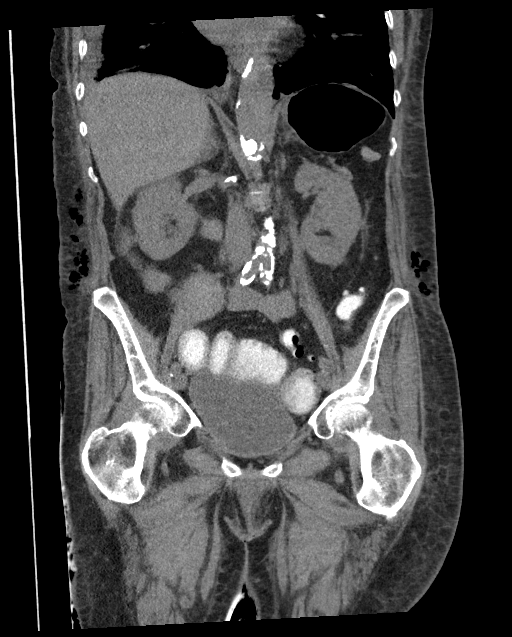
[im 65/117  soft-tissue]
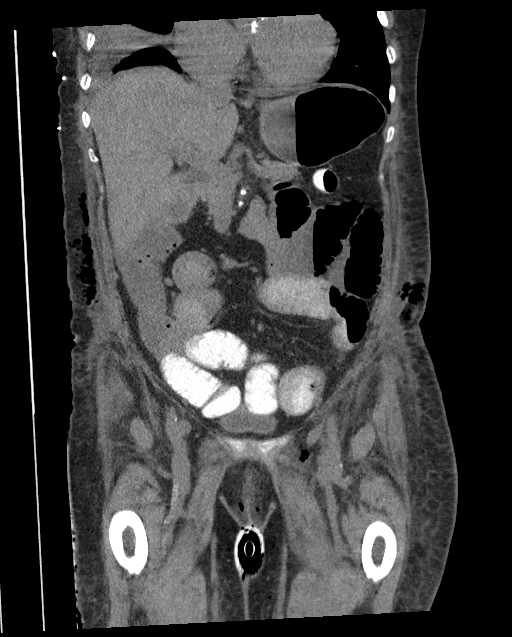

[14 of 46 positions shown; findings below may reference images not displayed]

FINDINGS: Study was performed with the patient in the right lateral decubitus
position. Rectal contrast was administered.

Lower chest: Apparent right middle lobe collapse with a small amount
of adjacent pleural fluid, incompletely visualized. Mild dependent
atelectasis in both lower lobes. There is atherosclerosis of the
aorta and coronary arteries.

Hepatobiliary: Stable low-density liver lesions, likely cysts based
on stability. Stable mild biliary dilatation status post
cholecystectomy.

Pancreas: Unremarkable. No pancreatic ductal dilatation or
surrounding inflammatory changes.

Spleen: Normal in size without focal abnormality.

Adrenals/Urinary Tract: Both adrenal glands appear normal. Both
kidneys demonstrate mild perinephric soft tissue stranding, similar
to previous study. No evidence of urinary tract calculus or
hydronephrosis. The bladder appears unremarkable for its degree of
distention.

Stomach/Bowel: No oral contrast was administered. Rectal contrast
has passed retrograde through the colon and through the ileocolonic
anastomosis at the level of the mid transverse colon status post
right hemicolectomy. There is retrograde filling of the distal small
bowel. No anastomotic leak. The stomach and small bowel are
fluid-filled and mildly distended. No evidence of bowel wall
thickening or pneumatosis.

Vascular/Lymphatic: There are no enlarged abdominal or pelvic lymph
nodes. Diffuse aortic and branch vessel atherosclerosis.

Reproductive: Stable appearance of the uterus and adnexa. No adnexal
mass. Tubal ligation clips noted.

Other: As seen on the postoperative radiographs, there is a large
amount of soft tissue emphysema throughout the anterior abdominal
wall which extends from the perineum into the presternal soft
tissues. There are small focal fluid collections within the right
and left anterior abdominal wall, likely related to recent
laparoscopic procedure. No free intraperitoneal air, ascites or
focal extraluminal fluid collections identified within the
peritoneal cavity.

Musculoskeletal: No acute or significant osseous findings. Stable
chronic mild superior endplate compression fractures at L3 and L5.
IMPRESSION: 1. Status post recent ileostomy reversal and ileocolonic
anastomosis. The anastomosis is patent, without leak.
2. Nonspecific gastric and proximal small bowel distension without
focal transition point, likely related to a postoperative ileus.
Recommend radiographic follow-up. Patient may benefit from a
nasogastric tube.
3. As seen on postoperative radiographs, there is extensive soft
tissue emphysema throughout the abdominal wall which is likely
related to the laparoscopic procedure. Soft tissue infection should
be excluded clinically. Small fluid collections in the anterior
abdominal wall are attributed to the procedure, and no unexpected
collections are identified. There is no pneumoperitoneum or abnormal
intra-abdominal fluid collection.
4. Atelectasis at both lung bases with apparent partial right middle
lobe collapse.
5. Extensive Aortic Atherosclerosis ([id number]).

## 2023-03-15 IMAGING — DX DG ABDOMEN 1V
1 series · 2 of 2 positions shown · non-contrast
Comparison: 10/26/2021

CLINICAL DATA: NG tube placement

EXAM:
ABDOMEN - 1 VIEW

[Series 1: abdomen supine · 0.14mm/px · 2 of 2 slices shown]
[im 1/2]
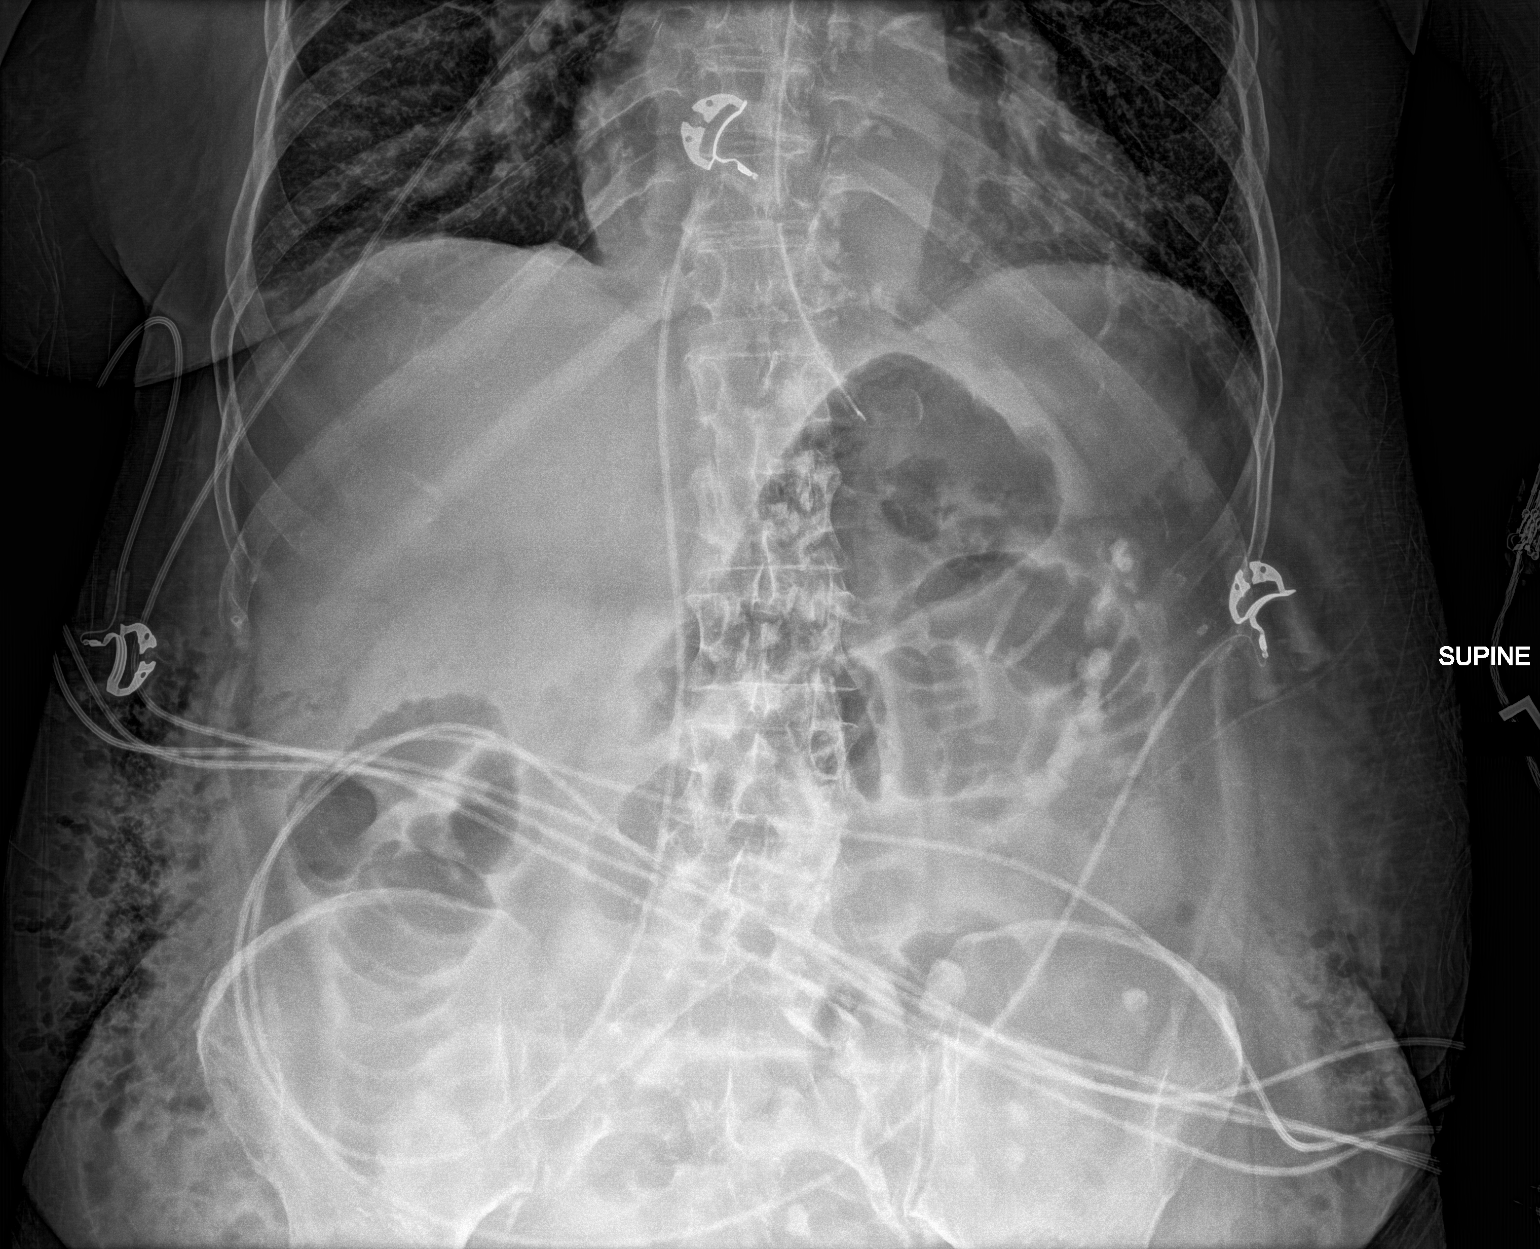
[im 2/2]
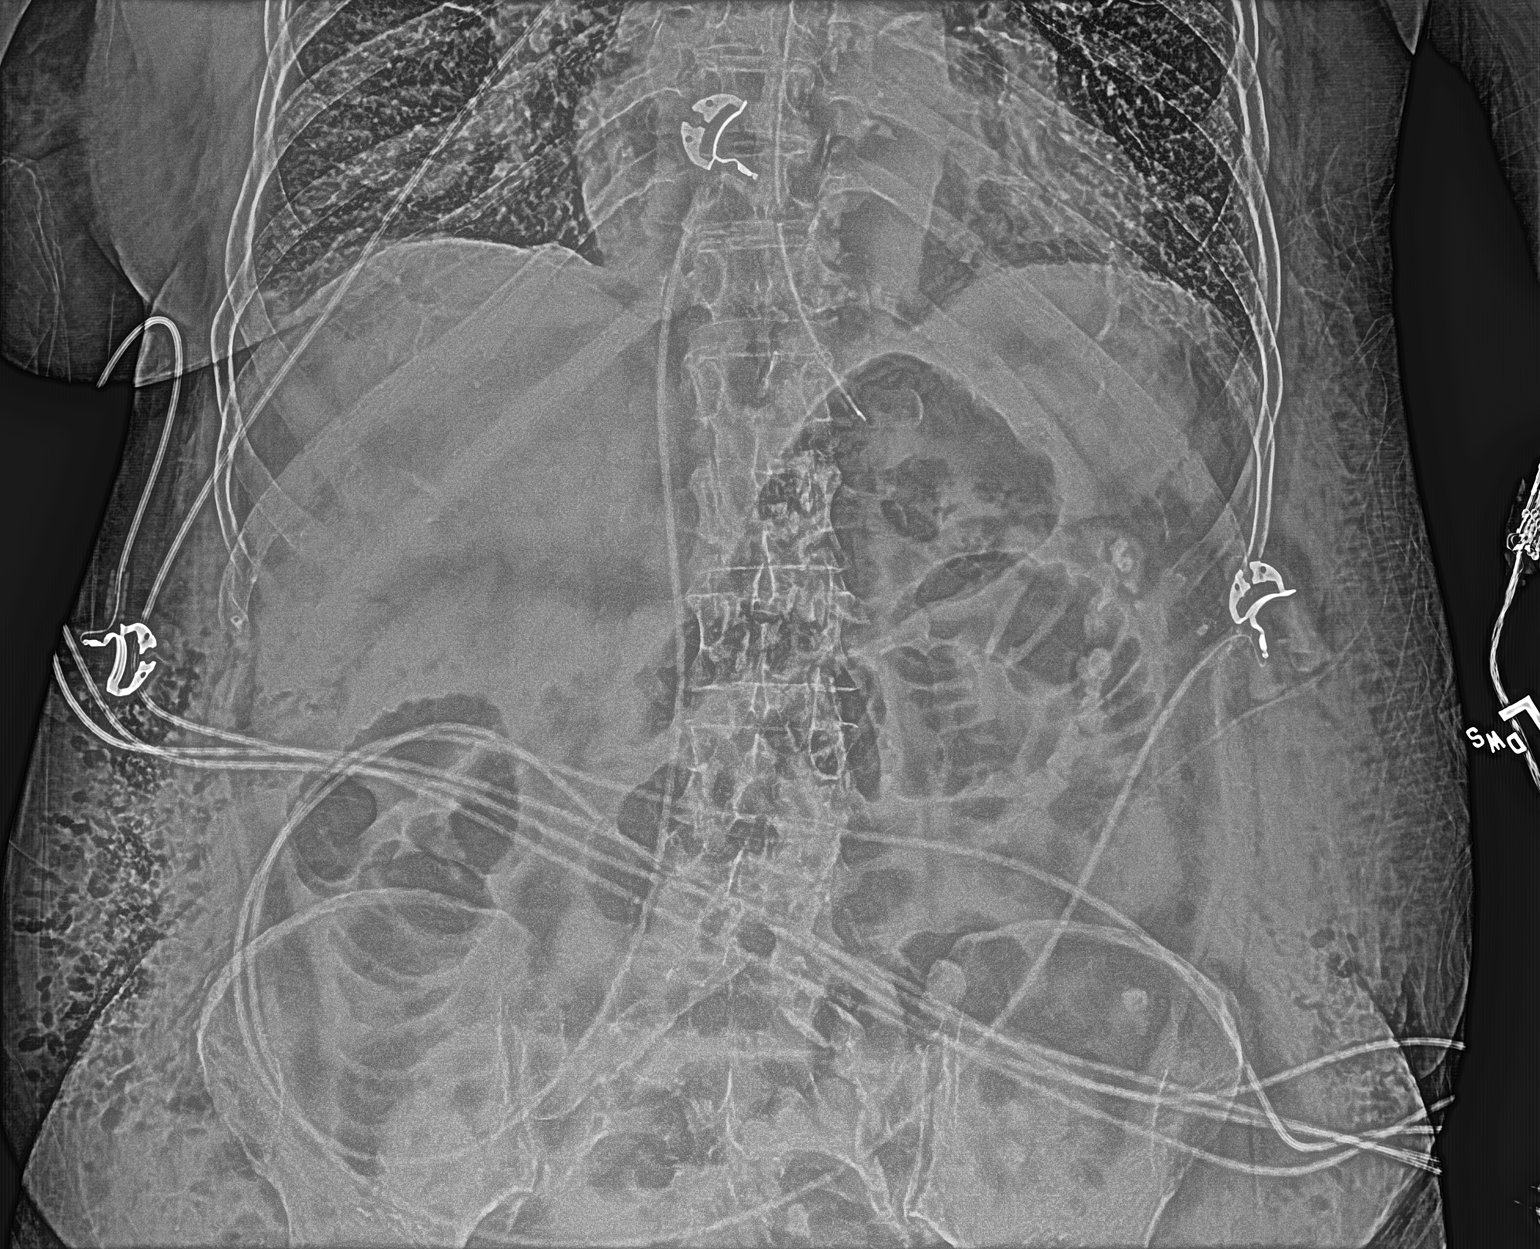

[2 of 2 positions shown; findings below may reference images not displayed]

FINDINGS: Limited radiograph of the lower chest and upper abdomen was obtained
for the purposes of enteric tube localization. Enteric tube is seen
coursing below the diaphragm with distal tipterminating within the
proximal stomach. Side port terminates within the distal esophagus.
Similar degree of gaseous distension of large and small bowel within
the visualized abdomen.
IMPRESSION: Enteric tube with distal tip terminating within the proximal
stomach, side port terminating within the distal esophagus.
Recommend advancement 7-10 cm.

## 2023-03-18 IMAGING — CR DG ABDOMEN 2V
1 series · 2 of 2 positions shown · non-contrast
Comparison: 10/28/2021

CLINICAL DATA: Ileus following gastrointestinal surgery, colostomy
takedown 10/23/2021

EXAM:
ABDOMEN - 2 VIEW

[Series 1: dg abd 2 views · 0.14mm/px · 2 of 2 slices shown]
[im 1/2]
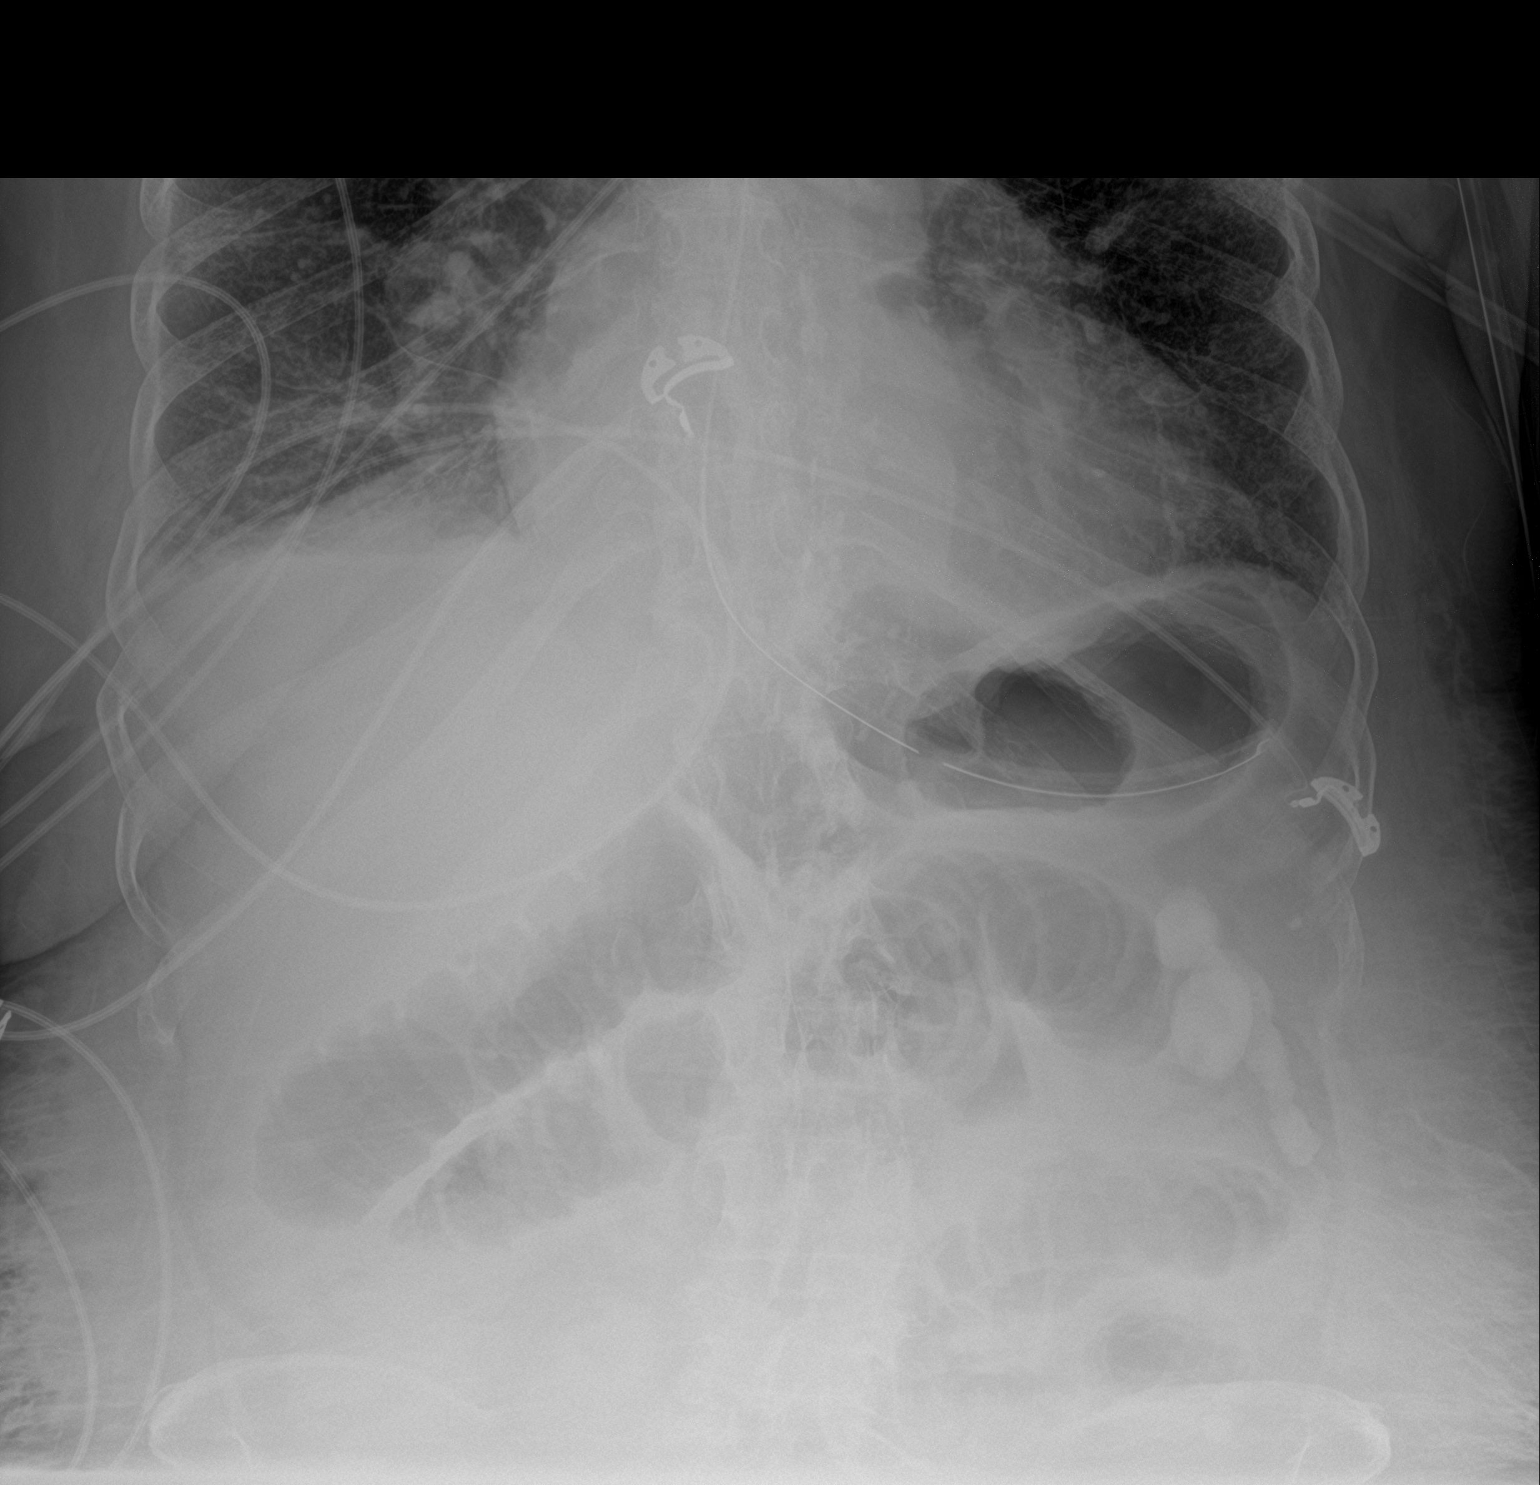
[im 2/2]
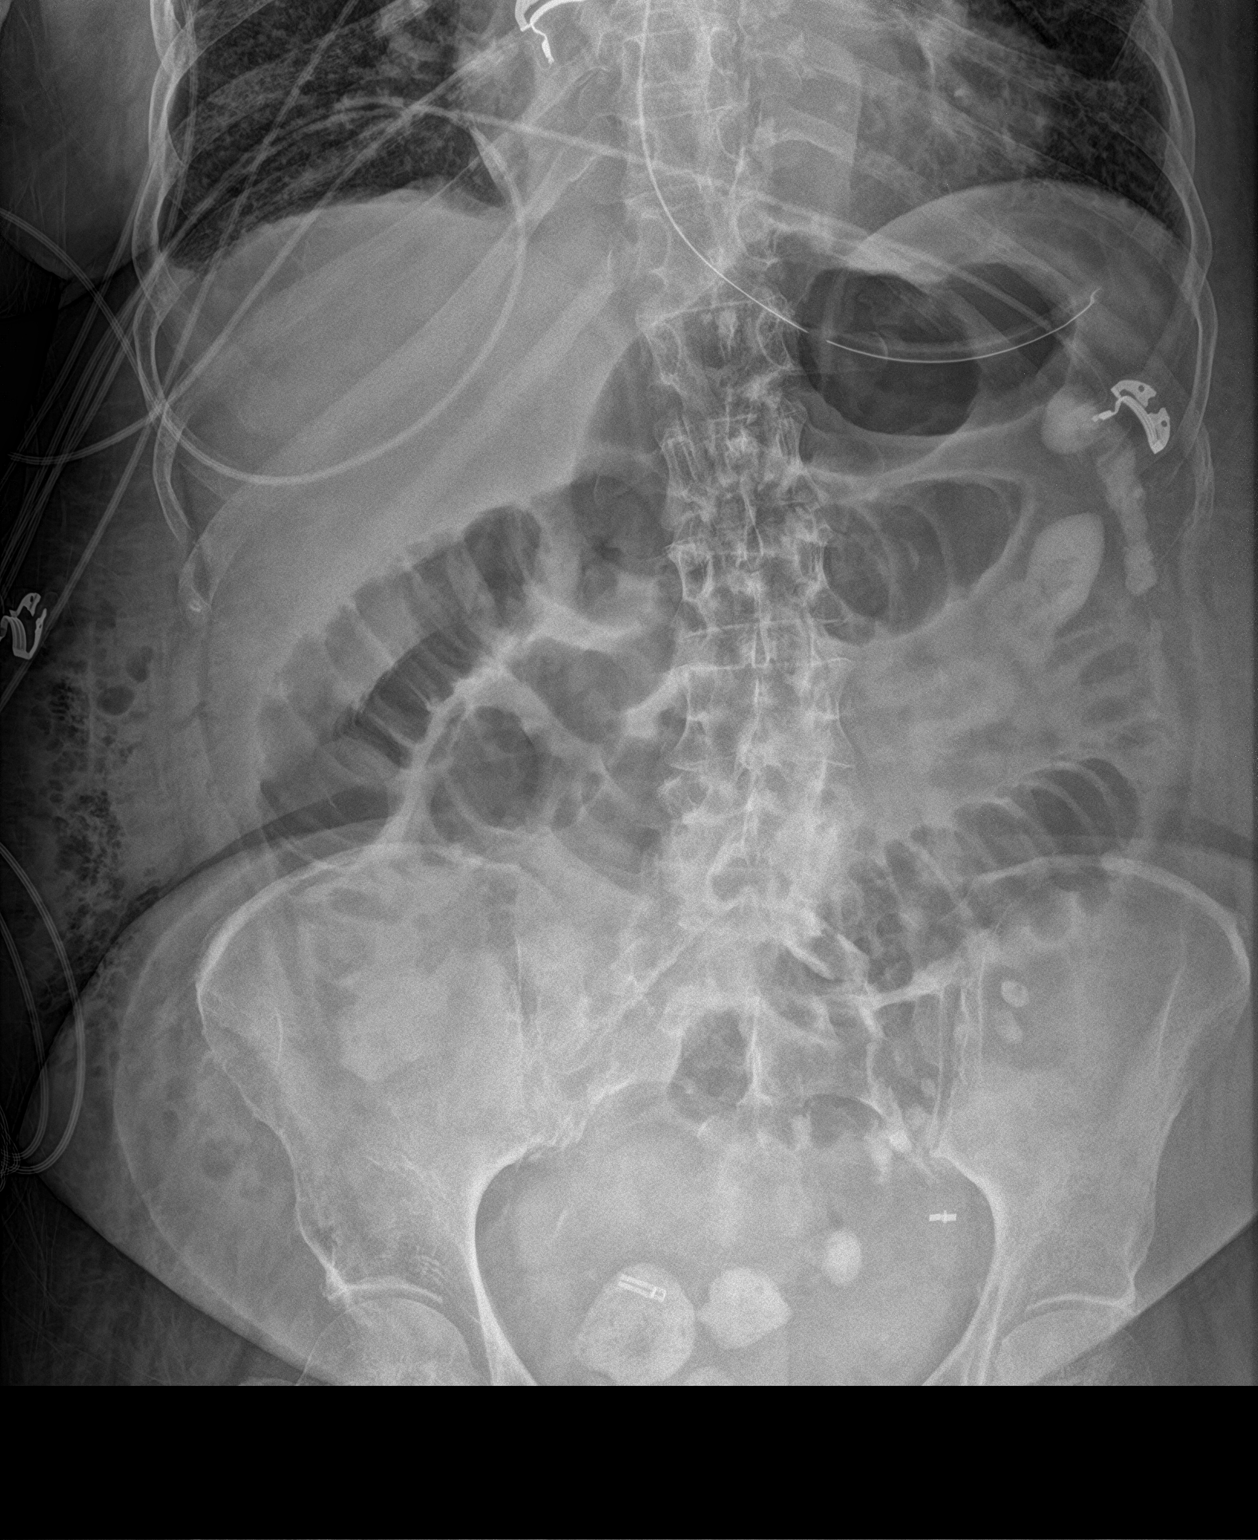

[2 of 2 positions shown; findings below may reference images not displayed]

FINDINGS: Nasogastric tube in proximal stomach.

Persistent dilatation of small bowel loops in the abdomen without
wall thickening.

Small amount retained contrast in colon.

Levoconvex lumbar scoliosis.

Foci of soft tissue gas at RIGHT lateral abdominal wall consistent
with preceding surgery.

Atelectasis at RIGHT lung base.
IMPRESSION: Persistent small bowel ileus.

## 2023-03-19 IMAGING — DX DG ABD PORTABLE 1V
1 series · 1 of 1 positions shown · non-contrast
Comparison: 10/31/2021

CLINICAL DATA: Enteric catheter placement

EXAM:
PORTABLE ABDOMEN - 1 VIEW

[abdomen supine]
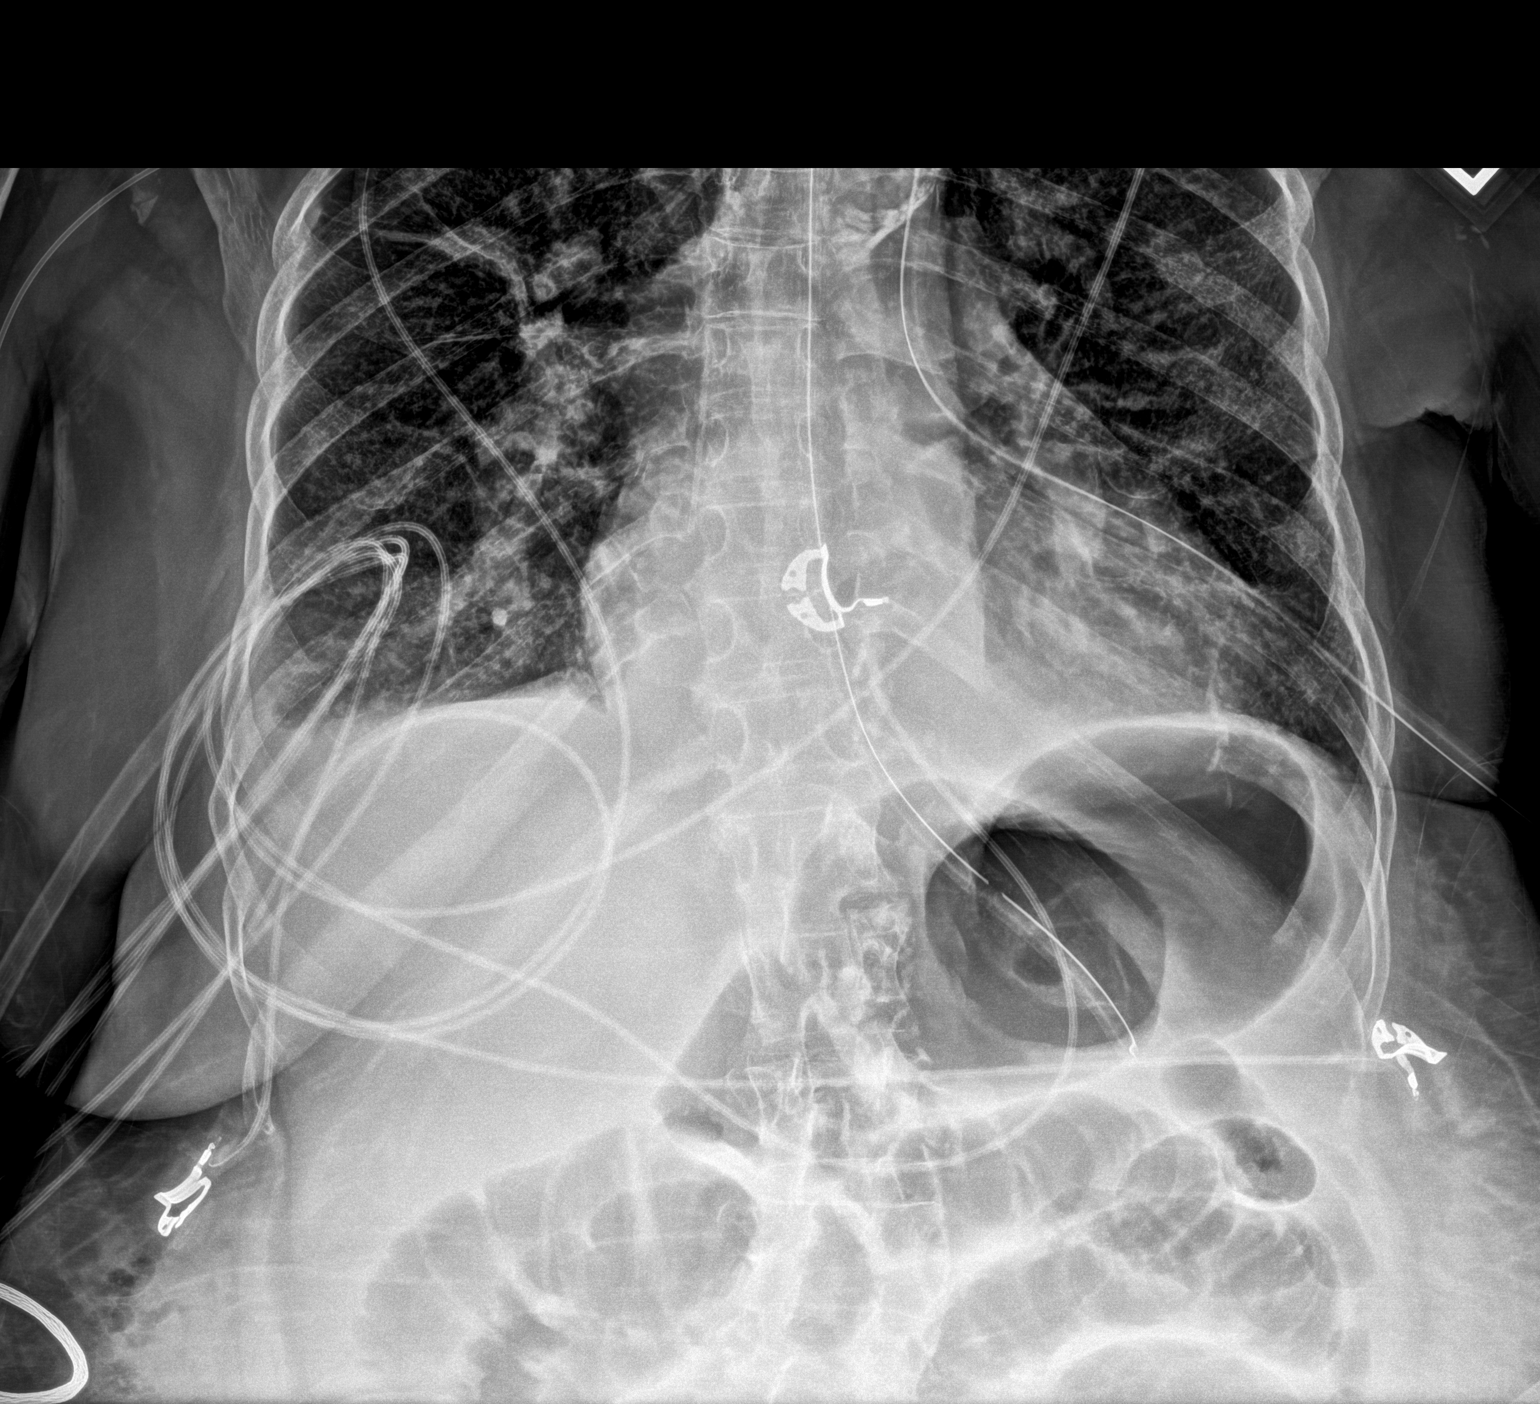

[1 of 1 positions shown; findings below may reference images not displayed]

FINDINGS: Frontal view of the lower chest and upper abdomen demonstrates
enteric catheter tip and side port projecting over the gastric
fundus. Right-sided PICC tip overlies atriocaval junction. Cardiac
silhouette is enlarged. Small right pleural effusion. Stable
vascular congestion. Stable distended gas-filled loops of small
bowel, likely ileus given recent surgery.
IMPRESSION: 1. Enteric catheter projecting over gastric fundus.
2. Stable small bowel dilatation consistent with ileus.

## 2023-03-20 IMAGING — DX DG ABD PORTABLE 1V
1 series · 1 of 1 positions shown · non-contrast
Comparison: Film from the previous day.

CLINICAL DATA: 8 hour follow-up small-bowel delay

EXAM:
PORTABLE ABDOMEN - 1 VIEW

[abdomen supine]
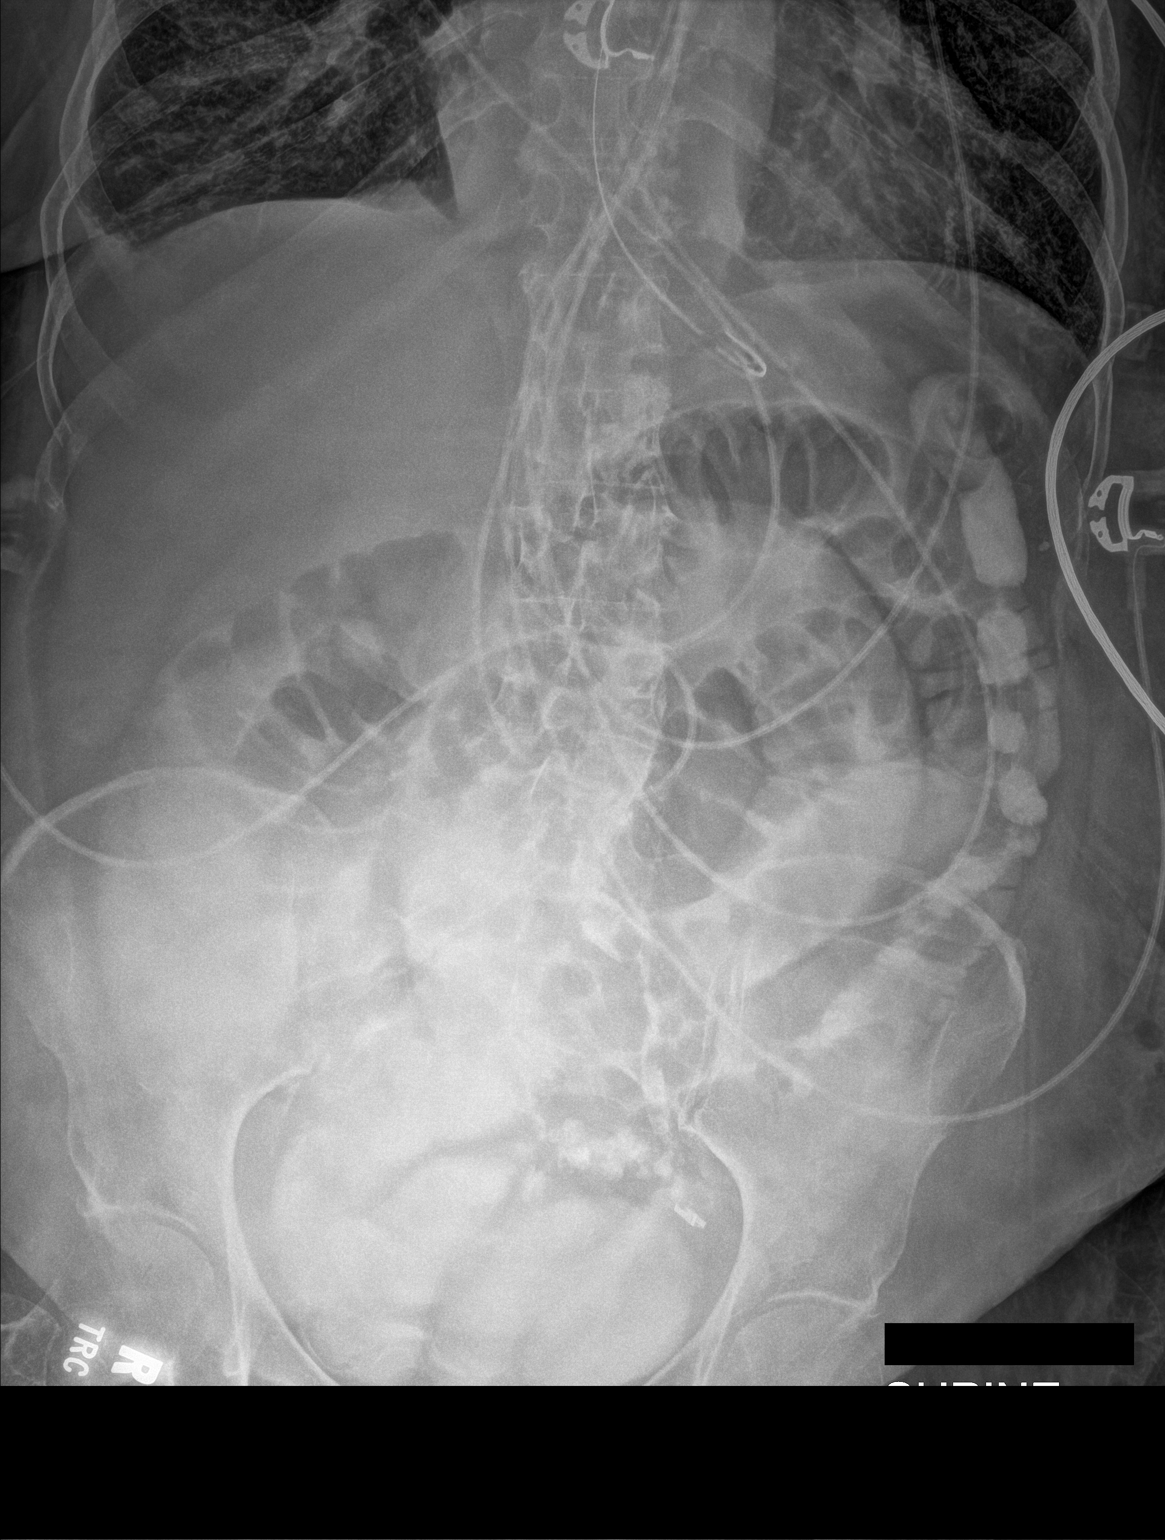

[1 of 1 positions shown; findings below may reference images not displayed]

FINDINGS: Gastric catheter is noted within the stomach. Contrast material
administered now lies within the dilated small bowel as well as
within the colon. These changes are consistent with a partial small
bowel obstruction. No free air is noted.
IMPRESSION: Passage of contrast into the colon consistent with a partial small
bowel obstruction.

## 2023-03-21 IMAGING — DX DG ABDOMEN 2V
2 series · 2 of 2 positions shown · non-contrast
Comparison: Previous studies including the examination of
11/02/2021

CLINICAL DATA: Abdominal distention

EXAM:
ABDOMEN - 2 VIEW

[abdomen erect]
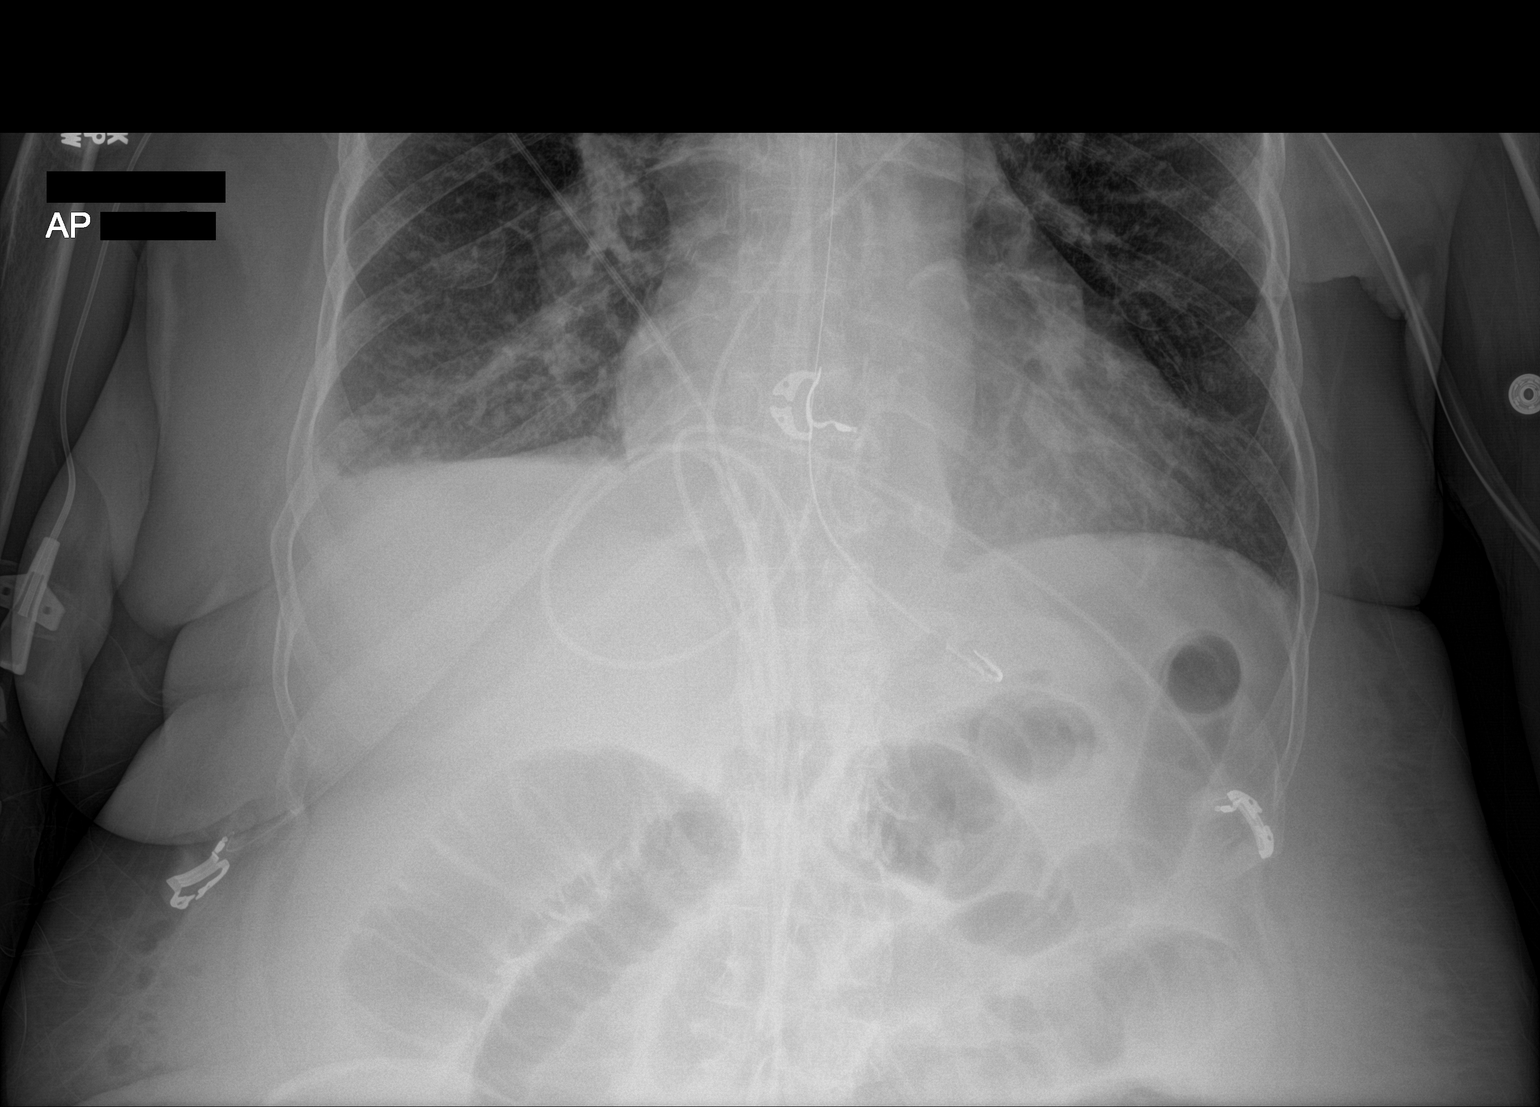

[abdomen supine]
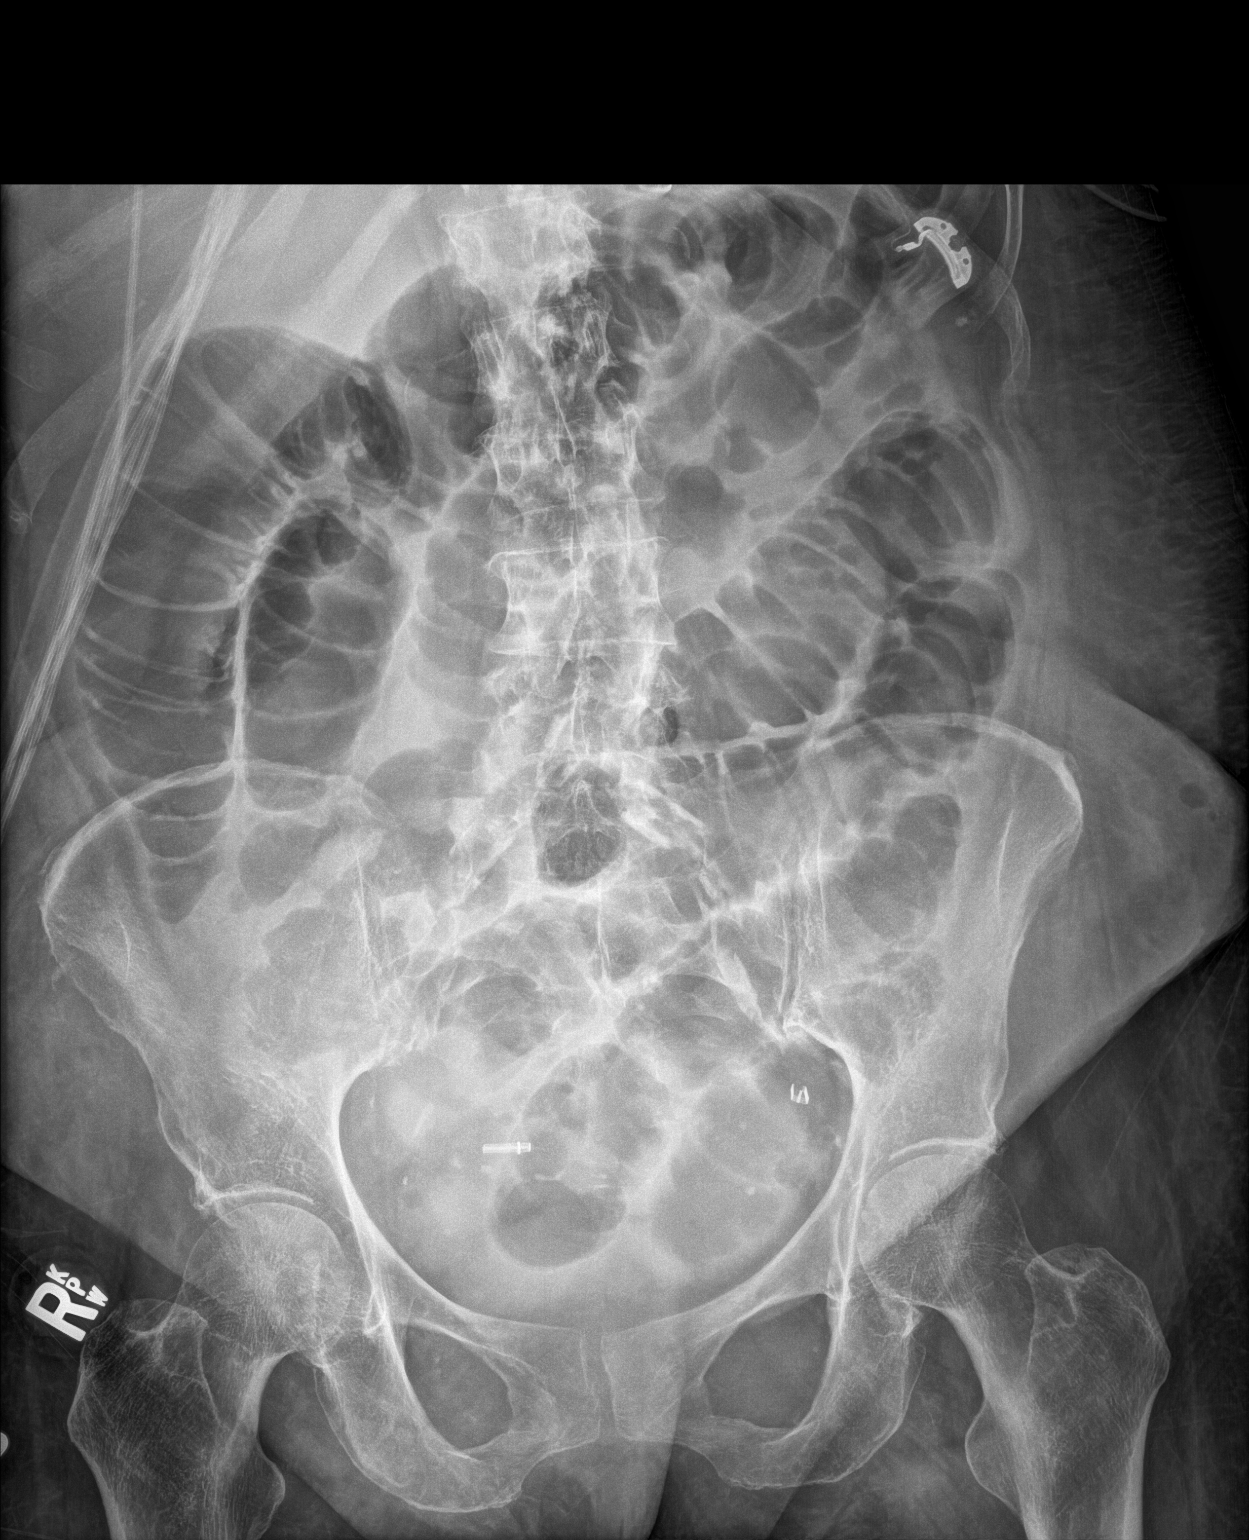

[2 of 2 positions shown; findings below may reference images not displayed]

FINDINGS: There is interval worsening of small bowel dilation measuring up to
4.9 cm in diameter. Tip enteric tube is noted at the
gastroesophageal junction pointing cephalad. There is no distention
of stomach. There is interval passage of contrast from the colon.
There is evidence of previous tubal ligation.
IMPRESSION: There is interval increase in small bowel dilation suggesting
high-grade small bowel obstruction.

Tip of enteric tube is at the gastroesophageal junction pointing
cephalad. NG tube should be advanced 5-10 cm to place the tip and
side port within the stomach.

## 2023-03-21 IMAGING — DX DG ABDOMEN 1V
1 series · 1 of 1 positions shown · non-contrast
Comparison: 11/03/2021 [DATE] a.m.

CLINICAL DATA: NG tube placement

EXAM:
ABDOMEN - 1 VIEW

[abdomen supine]
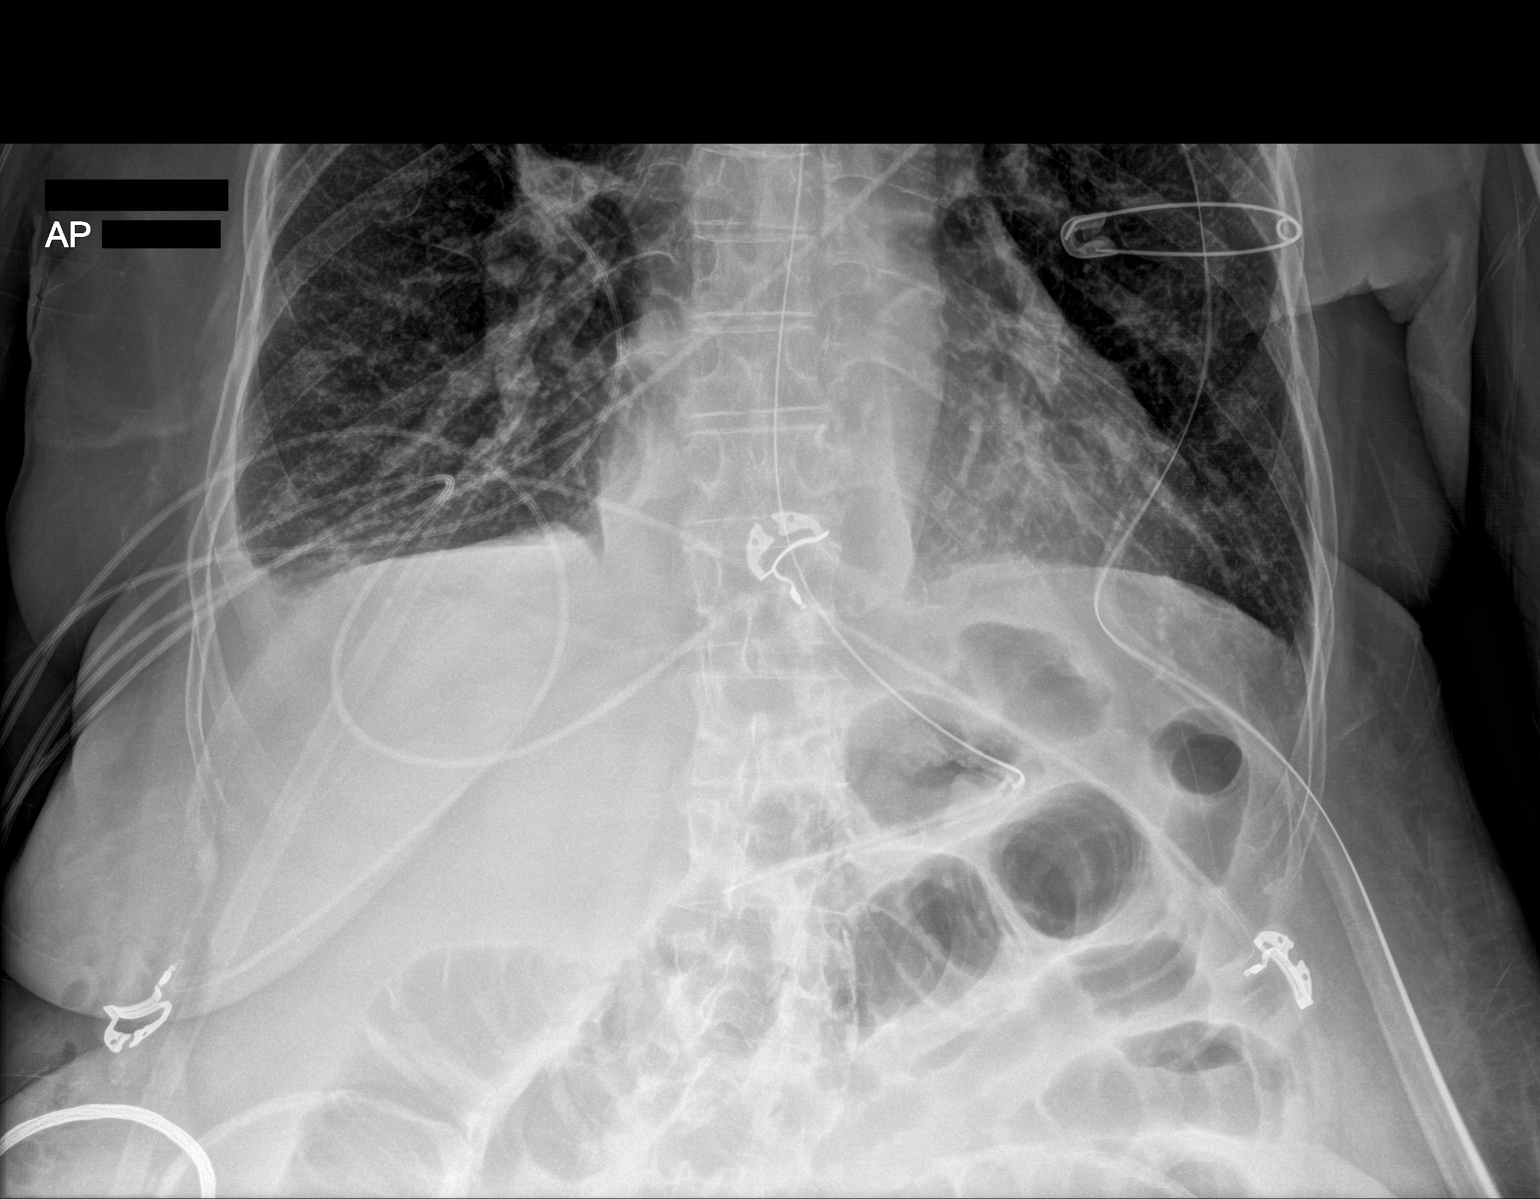

[1 of 1 positions shown; findings below may reference images not displayed]

FINDINGS: Advancement of NG tube with tip and side port below the GE junction.
Redemonstrated small bowel dilatation, not significantly changed
from the prior exam. The stomach is not distended. The lower abdomen
is not included for evaluation.
IMPRESSION: Interval advancement of nasogastric tube, with tip and side port
overlying the stomach. Overall unchanged small bowel dilatation in
the imaged portion of the abdomen.

## 2023-03-23 IMAGING — DX DG CHEST 1V PORT
1 series · 1 of 1 positions shown · non-contrast
Comparison: Chest radiograph 10/26/2021.

CLINICAL DATA: Fever.

EXAM:
PORTABLE CHEST 1 VIEW

[chest ap]
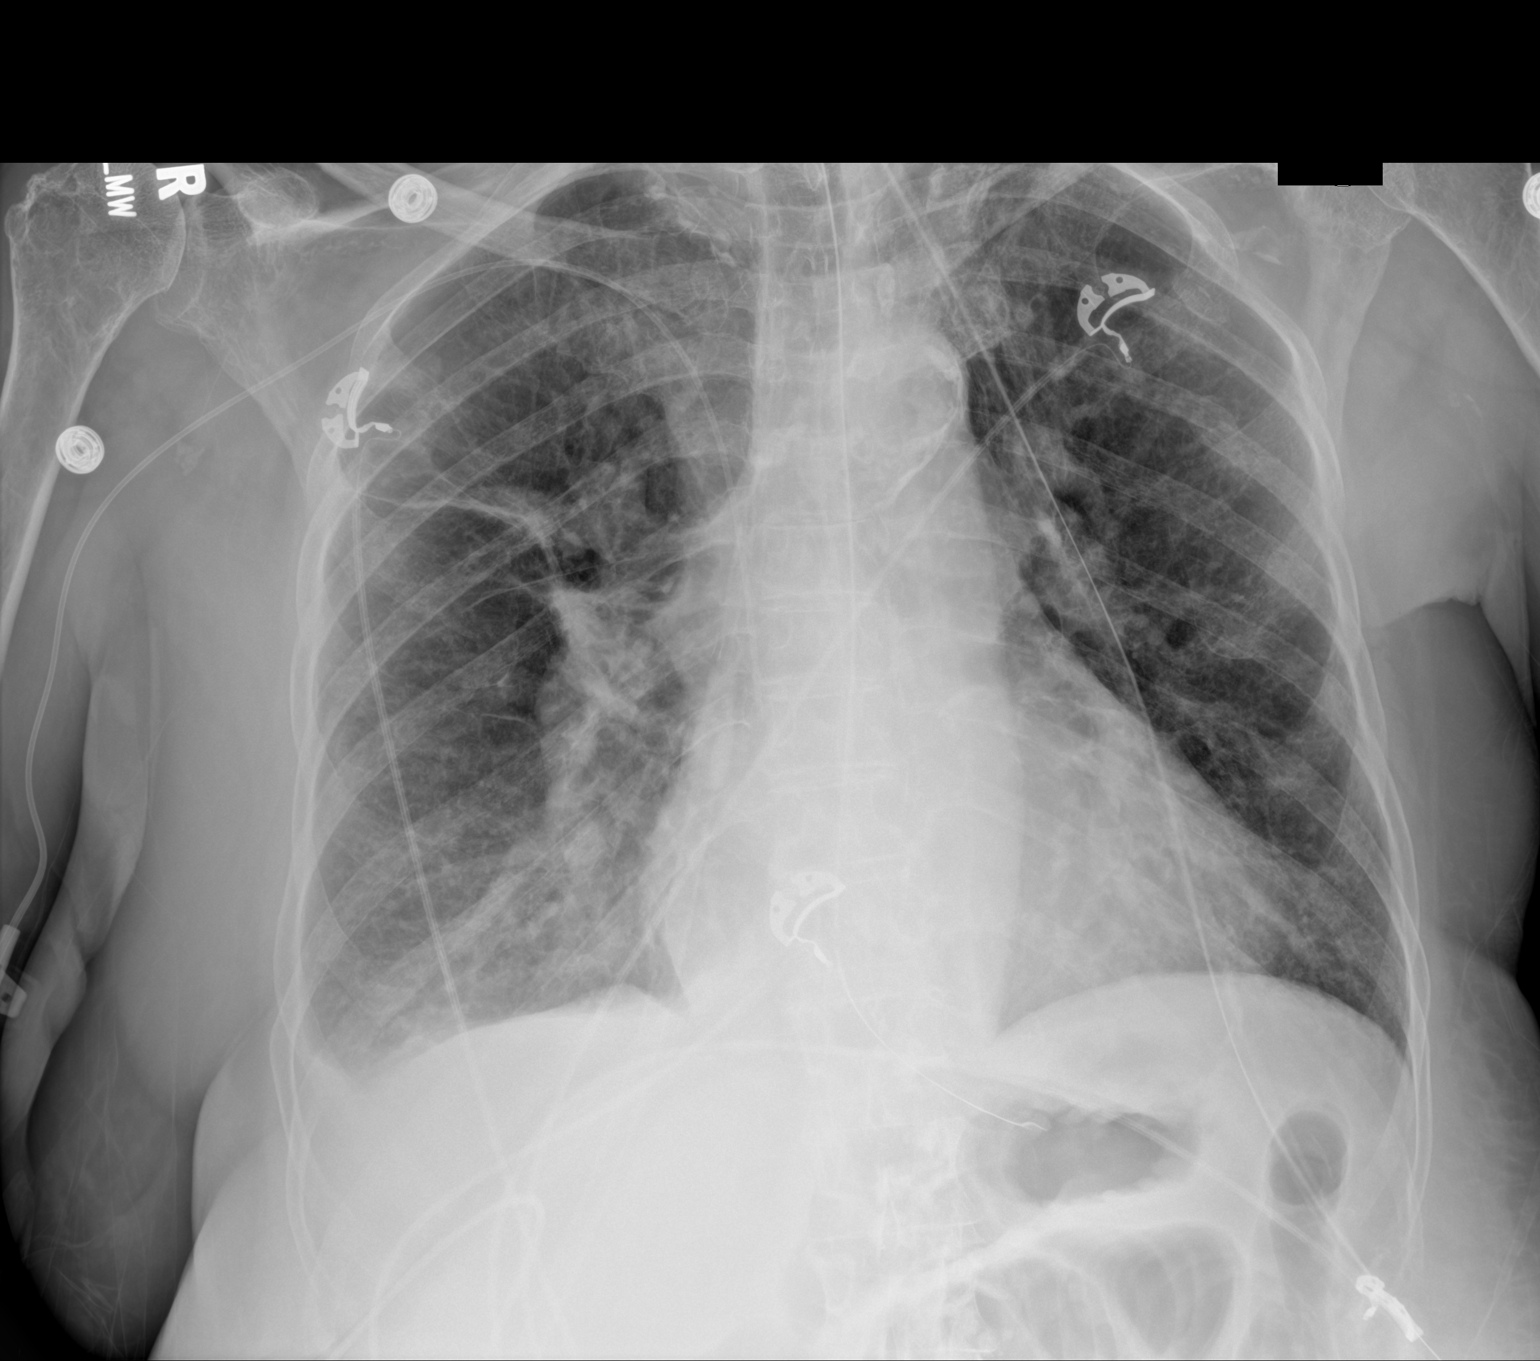

[1 of 1 positions shown; findings below may reference images not displayed]

FINDINGS: Right upper extremity PICC line tip projects over the superior vena
cava. Enteric tube is present. The side port is at the level of the
distal esophagus. Stable enlarged cardiac and mediastinal contours.
Similar-appearing distortion and consolidation within the mid right
lung. Bibasilar atelectasis, right-greater-than-left. Probable small
right pleural effusion. No pneumothorax.
IMPRESSION: Enteric tube side-port at the distal esophagus, recommend
advancement.

Right upper extremity PICC line tip projects over the superior vena
cava.

Similar-appearing irregular and nodular areas of consolidation
within the right mid lung.

These results will be called to the ordering clinician or
representative by the Radiologist Assistant, and communication
documented in the PACS or [REDACTED].

## 2023-03-24 IMAGING — DX DG ABDOMEN 2V
3 series · 3 of 3 positions shown · non-contrast
Comparison: Abdominal x-ray 11/03/2021

CLINICAL DATA: Abdominal pain

EXAM:
ABDOMEN - 2 VIEW

[abdomen erect]
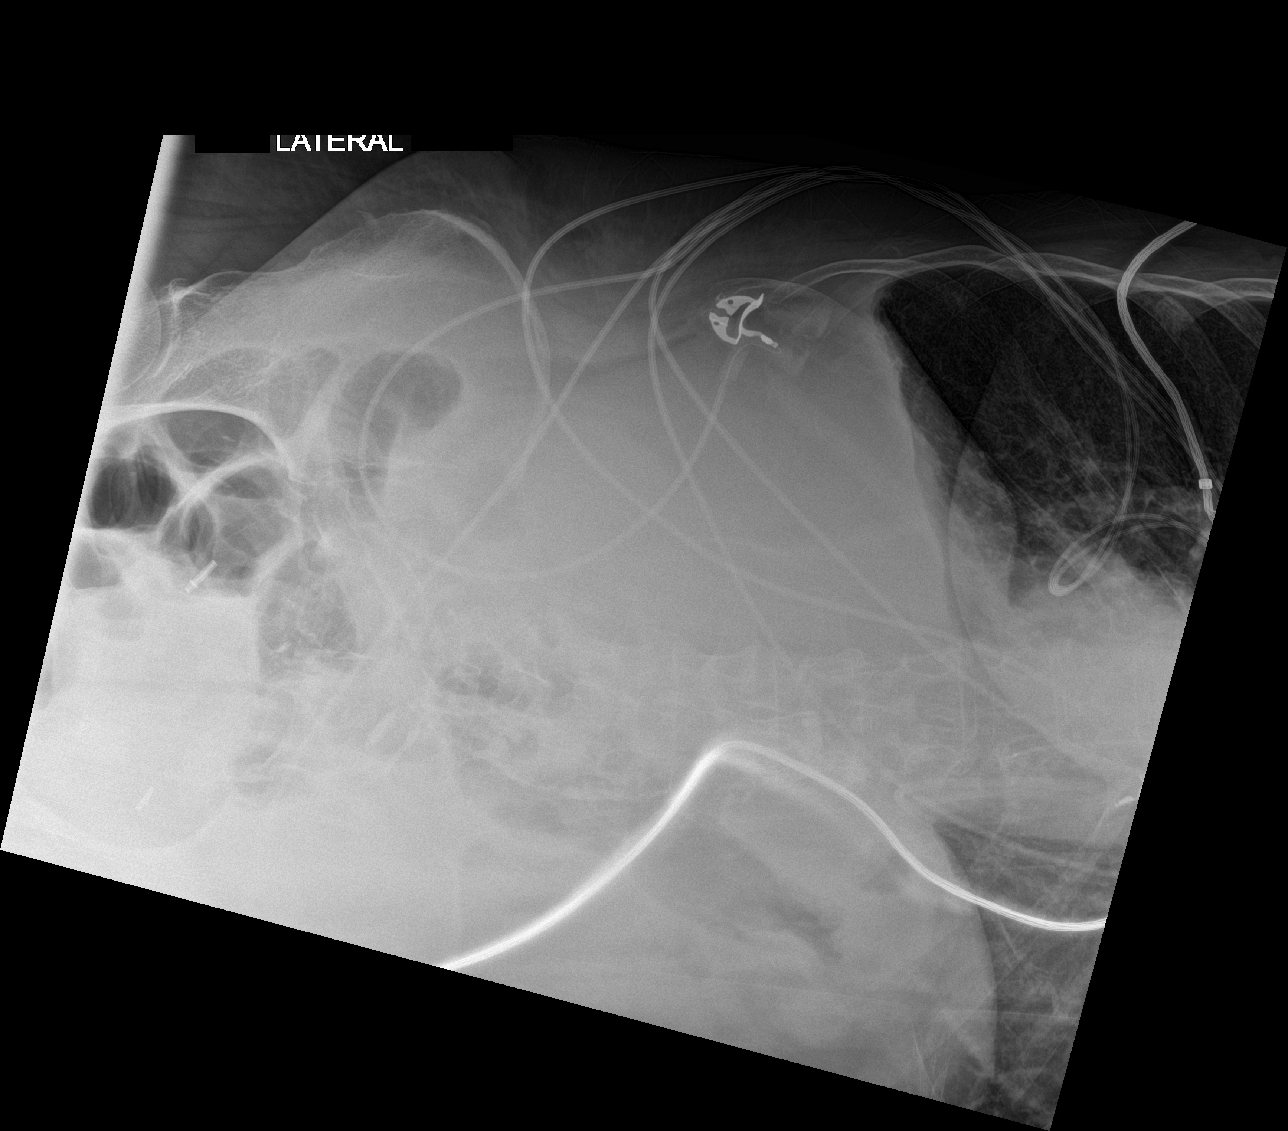

[abdomen supine (1 of 2)]
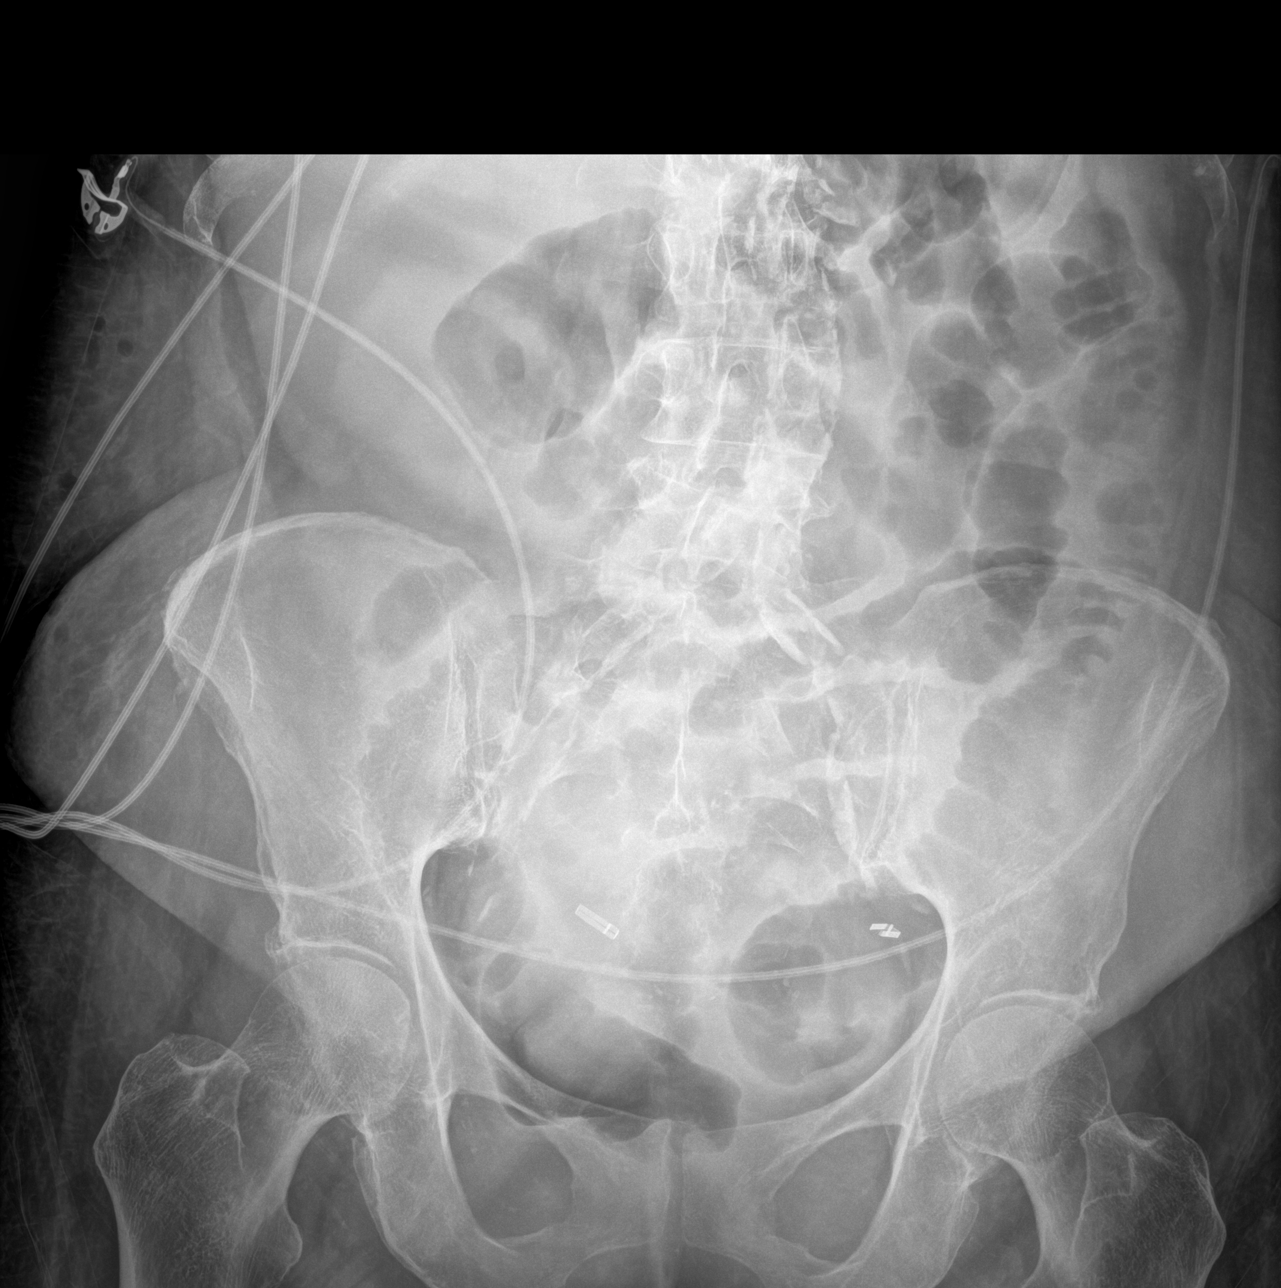

[abdomen supine (2 of 2)]
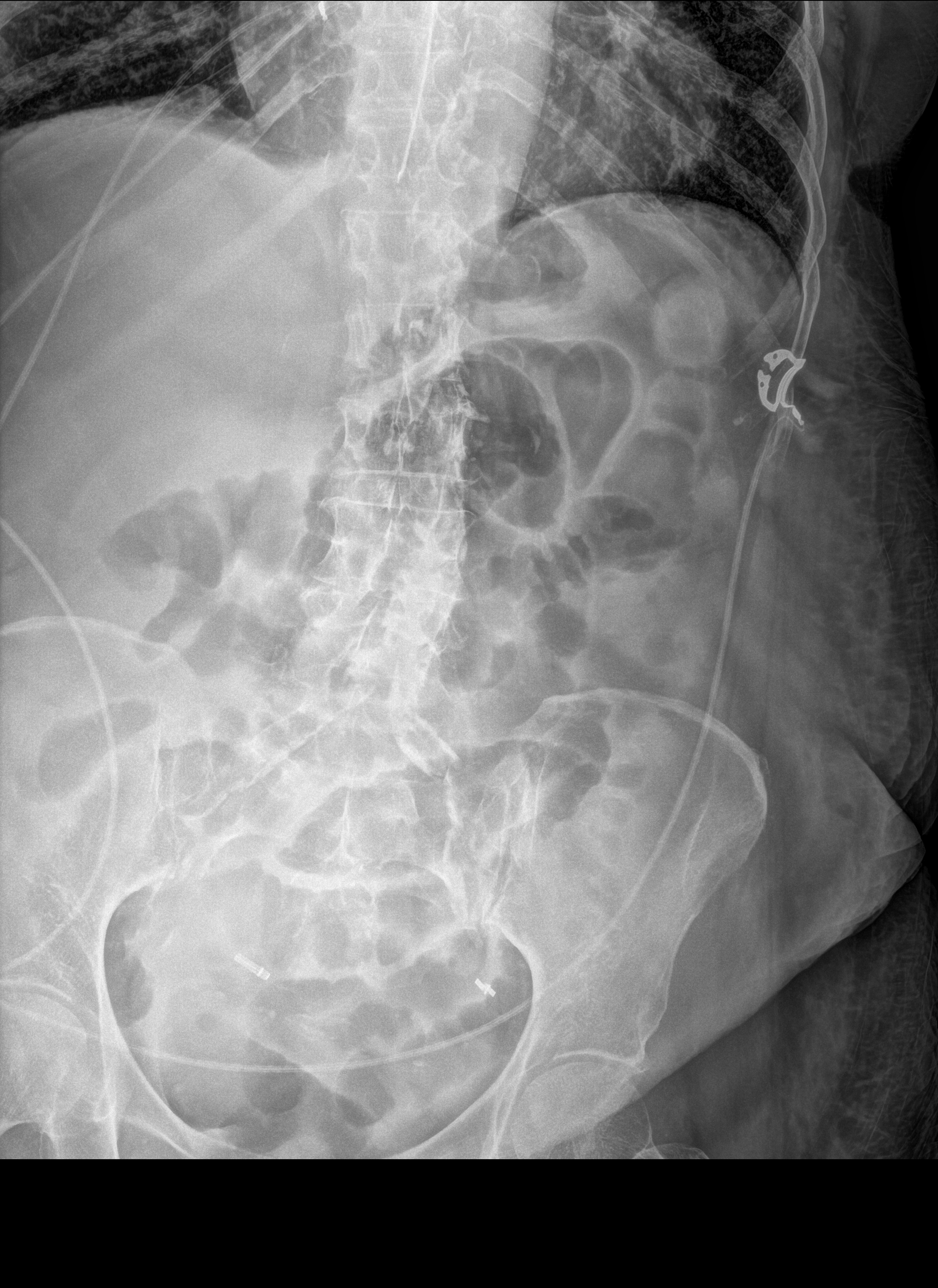

[3 of 3 positions shown; findings below may reference images not displayed]

FINDINGS: Enteric tube identified with the tip in the lower esophagus.

Mildly distended loops of small bowel are again seen throughout the
abdomen measuring up to 3.3 cm in diameter. No free air identified
on the decubitus view. No suspicious calcifications identified.
IMPRESSION: 1. Enteric tube tip is in the lower esophagus, advancement
recommended.
2. Mildly distended loops of bowel throughout the abdomen which
could be seen with ileus or obstruction. Continued follow-up
recommended.

## 2023-03-27 IMAGING — CR DG CHEST 2V
2 series · 2 of 2 positions shown · non-contrast
Comparison: 11/05/2021

CLINICAL DATA: Shortness of breath.  Mild chest pain.  Smoker.

EXAM:
CHEST - 2 VIEW

[chest pa]
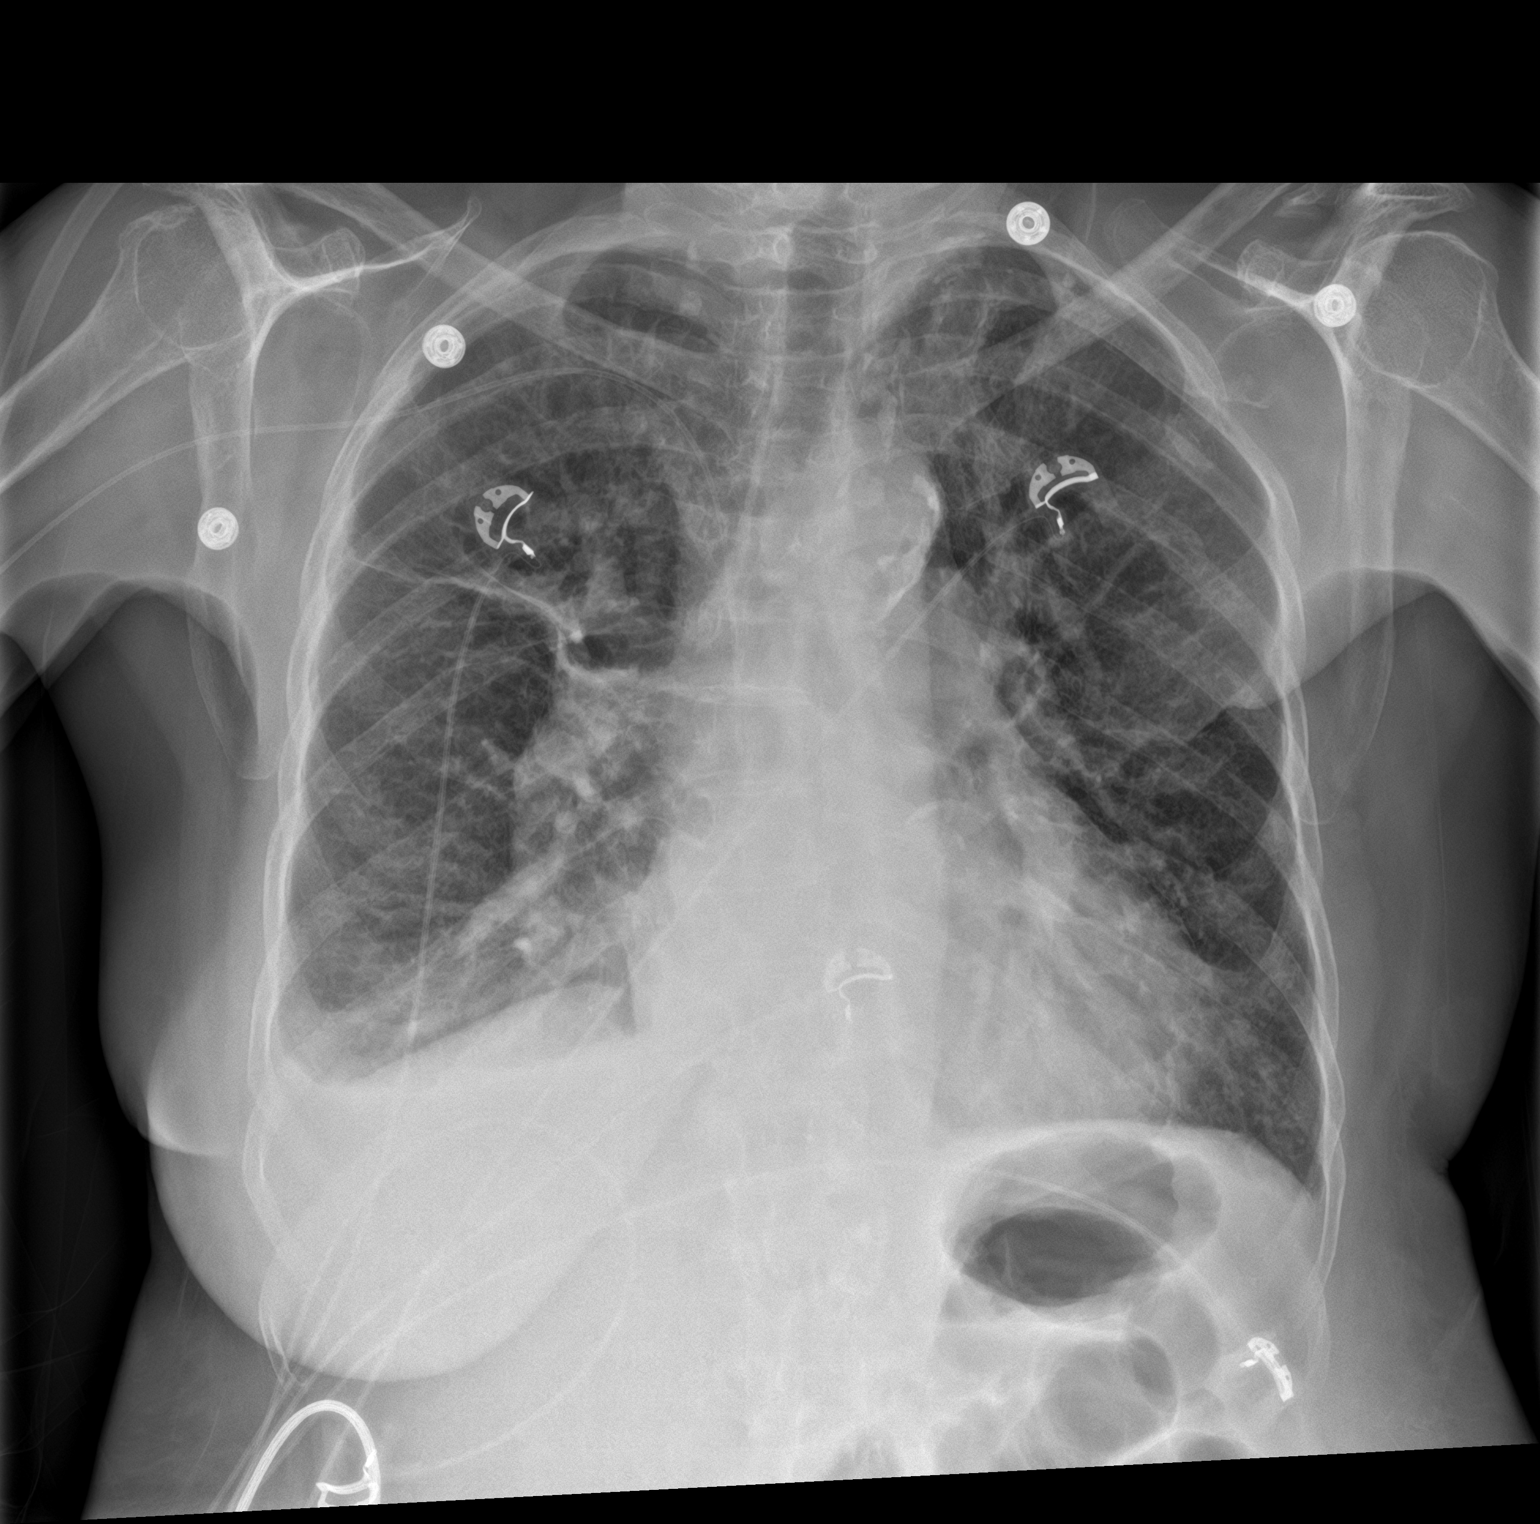

[chest lat]
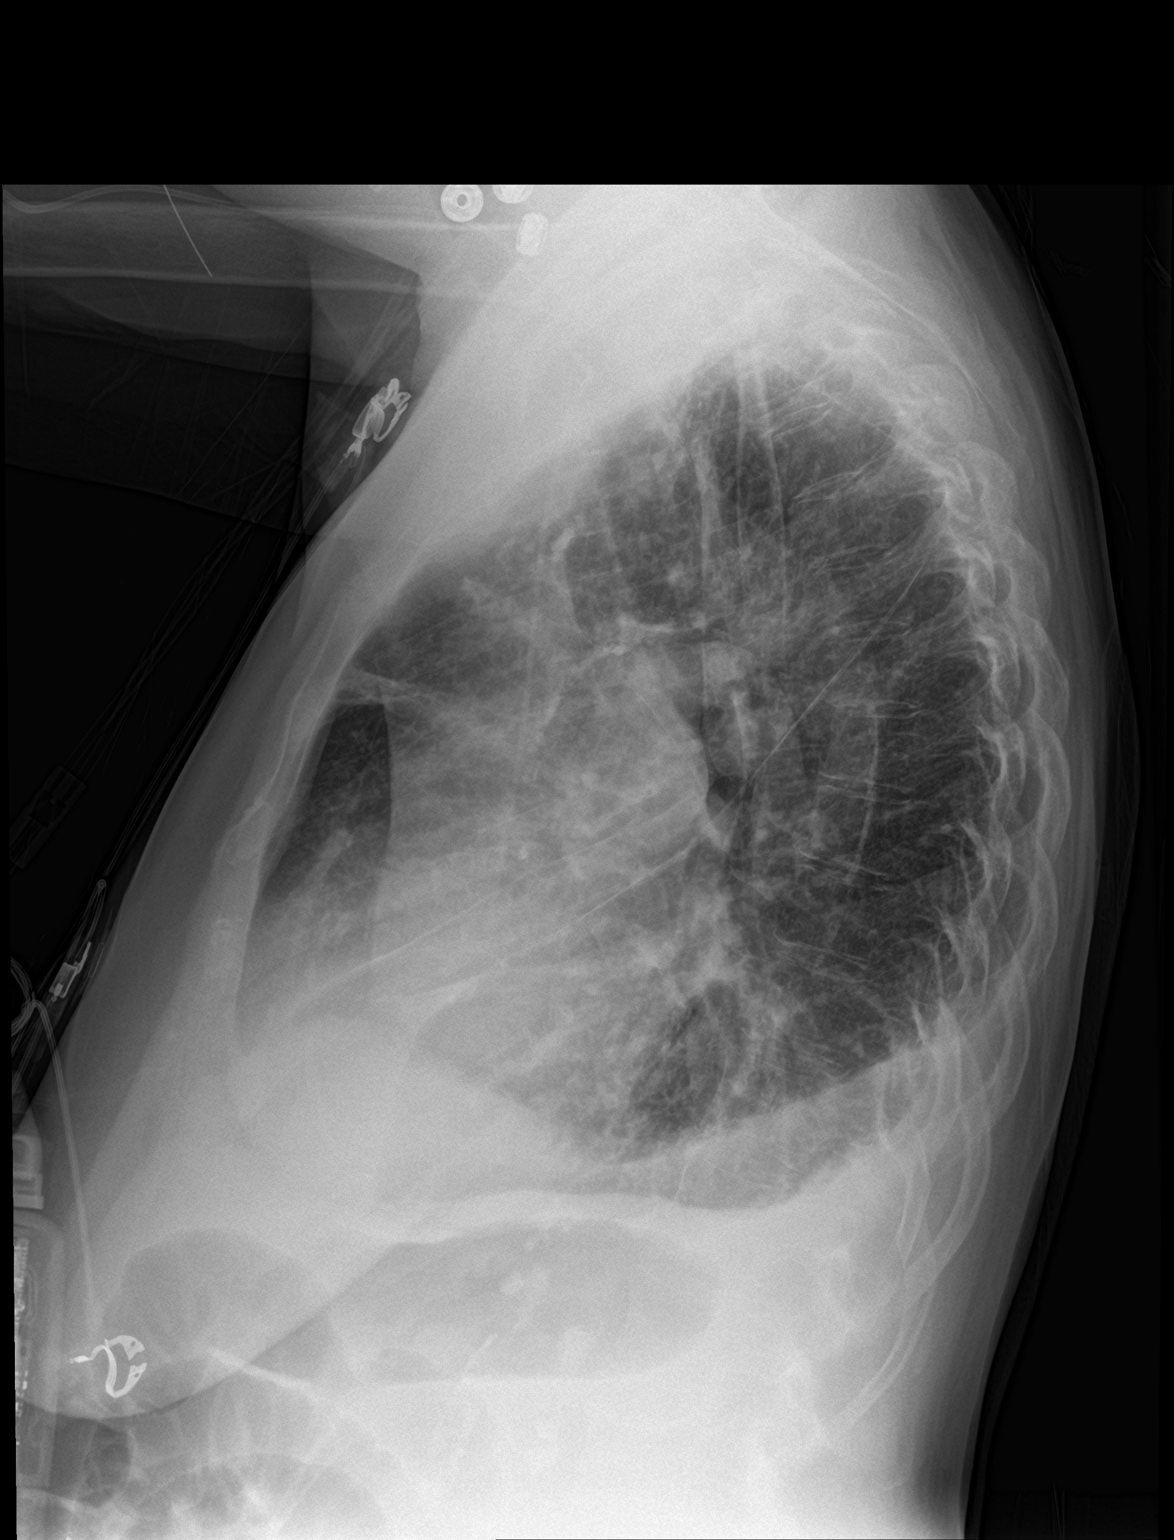

[2 of 2 positions shown; findings below may reference images not displayed]

FINDINGS: Right PICC tip in the region of the inferior aspect of the superior
vena cava without significant change. The nasogastric tube has been
removed.

Normal sized heart. Stable linear scarring in the right upper lobe.
No significant change in a small right pleural effusion and right
basilar airspace opacity. Clear left lung. Mildly tortuous and
calcified thoracic aorta. No acute bony abnormality.
IMPRESSION: Stable small right pleural effusion and right basilar atelectasis
and/or pneumonia.

## 2023-03-30 IMAGING — DX DG CHEST 1V PORT
1 series · 1 of 1 positions shown · non-contrast
Comparison: November 09, 2021

CLINICAL DATA: Acute respiratory failure with hypoxia.

EXAM:
PORTABLE CHEST 1 VIEW

[chest ap]
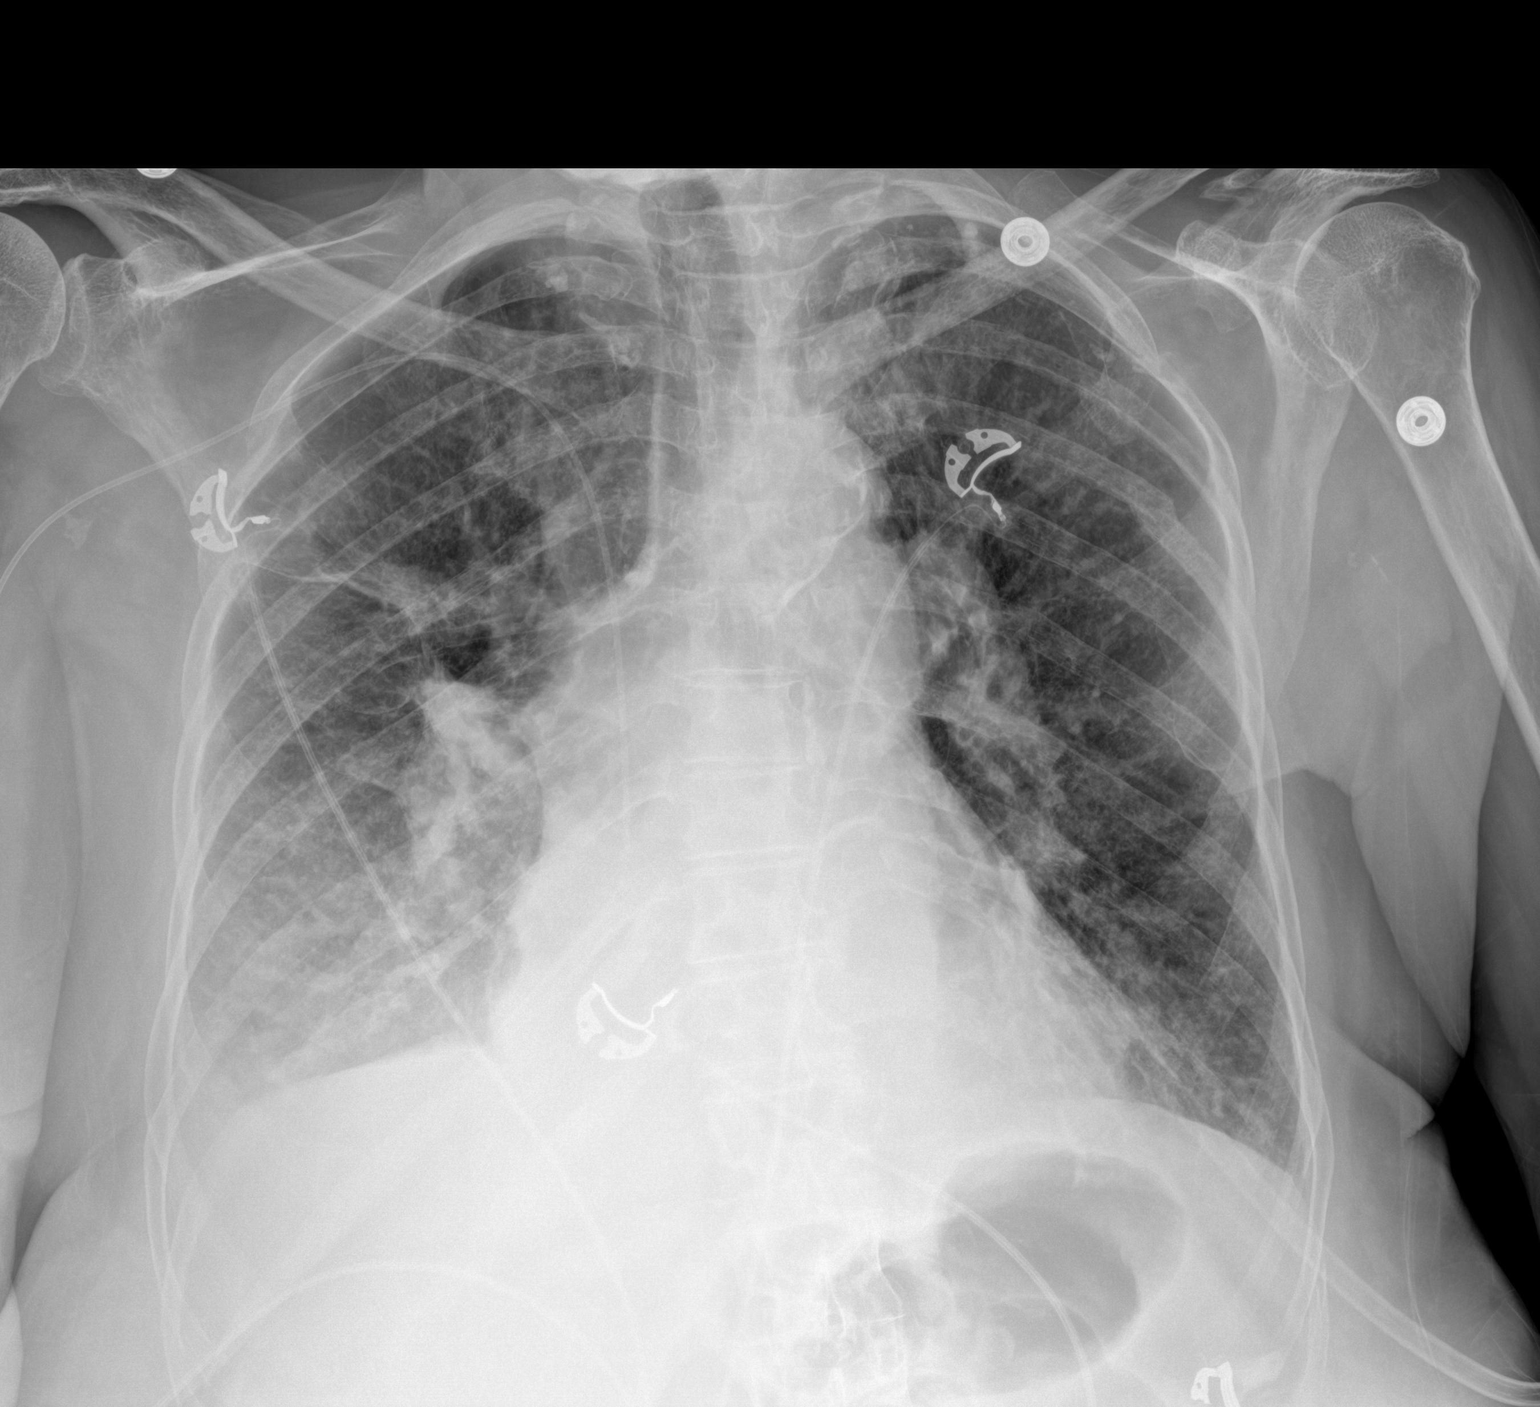

[1 of 1 positions shown; findings below may reference images not displayed]

FINDINGS: Right upper extremity PICC with tip near the superior cavoatrial
junction.

The heart size and mediastinal contours are within normal limits.
Stable linear scarring in the right upper lobe. Slightly increased
small right pleural effusion with adjacent airspace opacities.
Increased conspicuity of the irregular nodular areas of
consolidation in the right mid lung. The visualized skeletal
structures are unremarkable.
IMPRESSION: 1. Slightly increased small right pleural effusion with adjacent
airspace opacities.
2. Increased conspicuity of the irregular nodular areas of
consolidation in the right mid lung.

## 2023-03-31 IMAGING — DX DG CHEST 1V PORT
1 series · 1 of 1 positions shown · non-contrast
Comparison: Chest x-ray dated November 12, 2021

CLINICAL DATA: Post right thoracentesis

EXAM:
PORTABLE CHEST 1 VIEW

[chest ap]
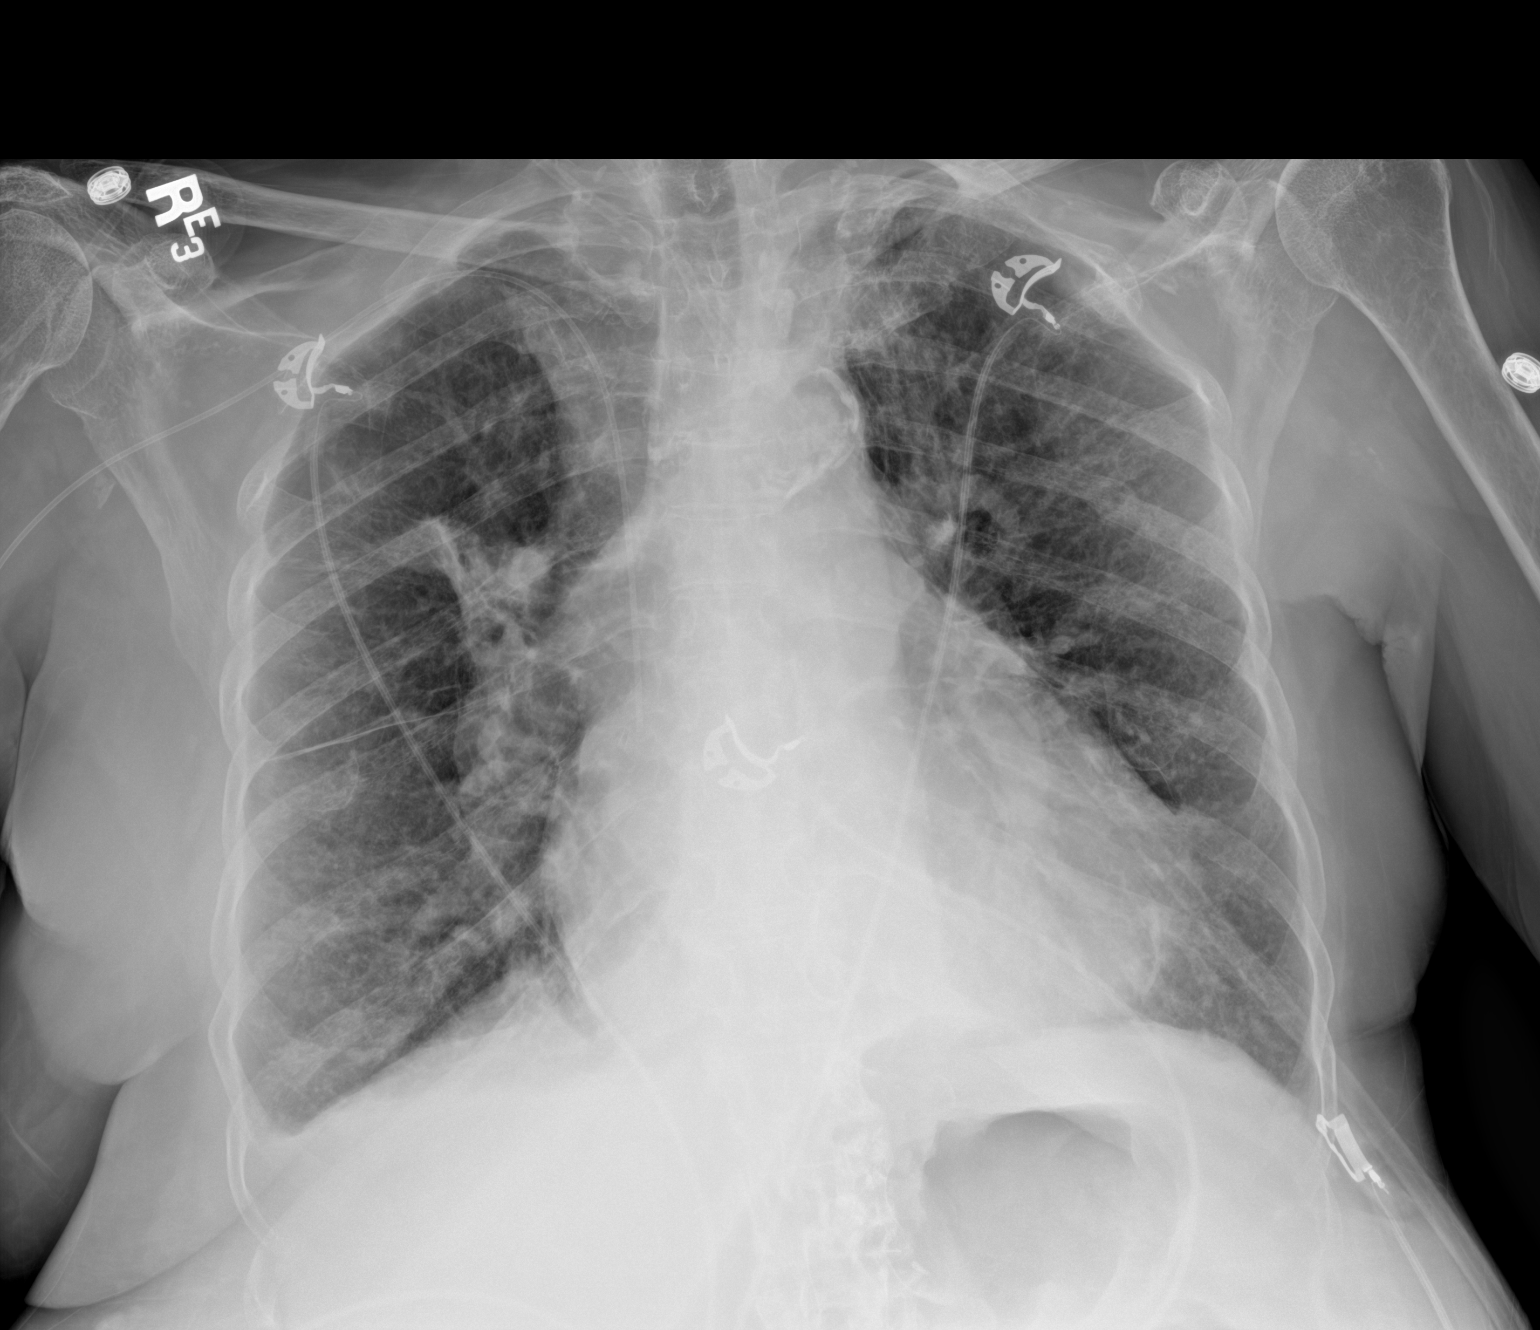

[1 of 1 positions shown; findings below may reference images not displayed]

FINDINGS: Cardiac and mediastinal contours are unchanged. Linear nodular
opacity of the right upper lobe, unchanged from prior exam. No new
parenchymal opacity. Interval decreased size of small right pleural
effusion. No evidence pneumothorax.
IMPRESSION: No evidence of pneumothorax.

## 2023-03-31 IMAGING — US US THORACENTESIS ASP PLEURAL SPACE W/IMG GUIDE
1 series · 4 of 4 positions shown · non-contrast
Comparison: none

INDICATION: Right pleural effusion with shortness breath request received for
diagnostic and therapeutic thoracentesis.

[Series 1: us thoracentesis asp pleural space w/img guide · 4 of 4 slices shown]
[im 1/4]
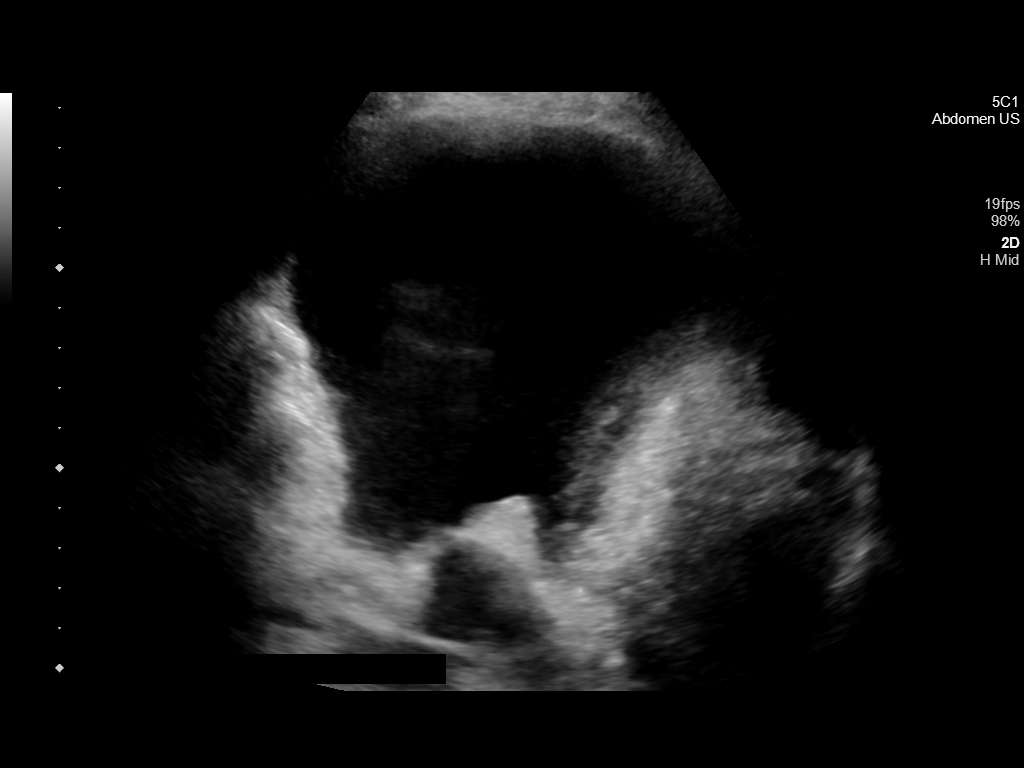
[im 2/4]
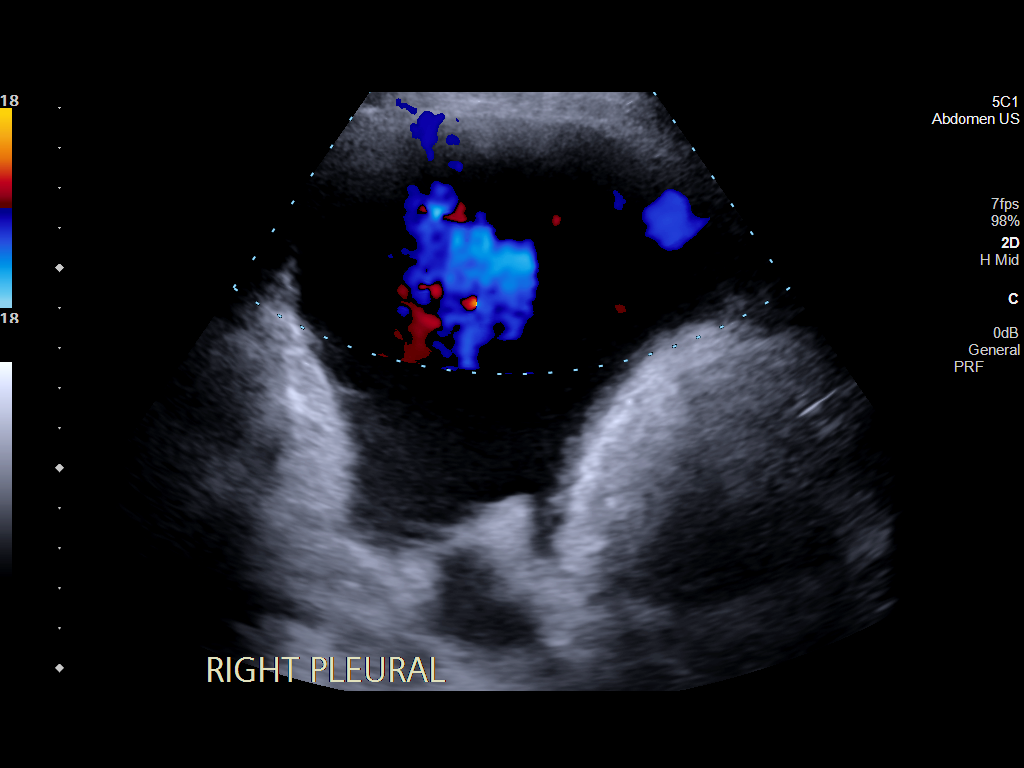
[im 3/4]
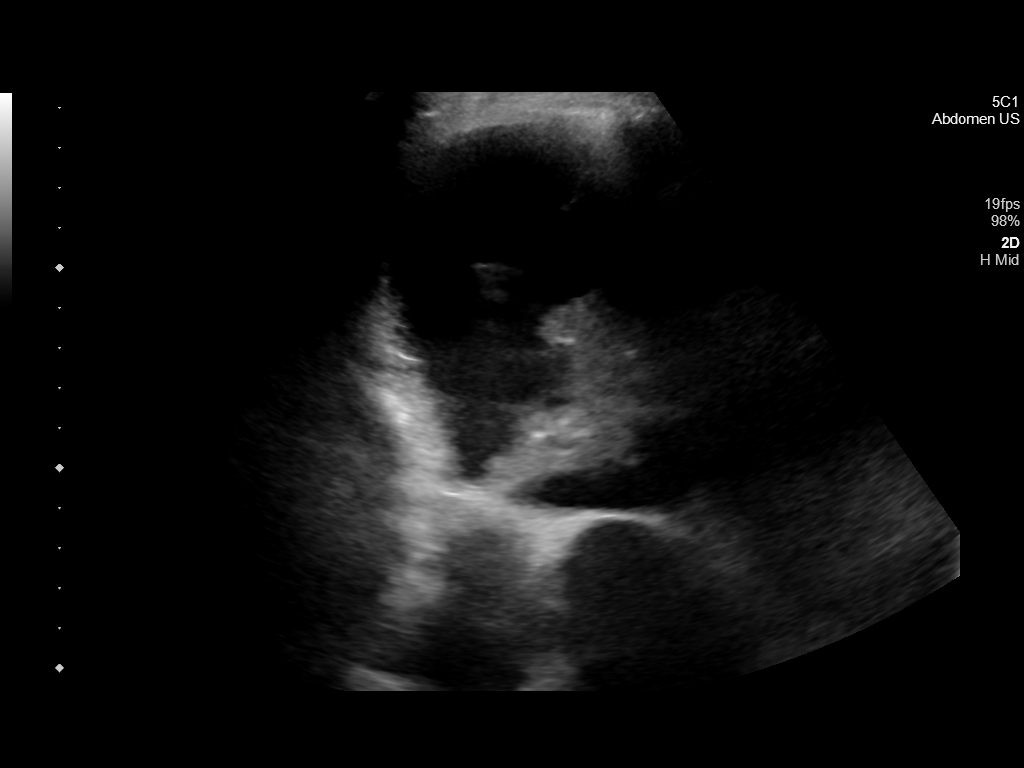
[im 4/4]
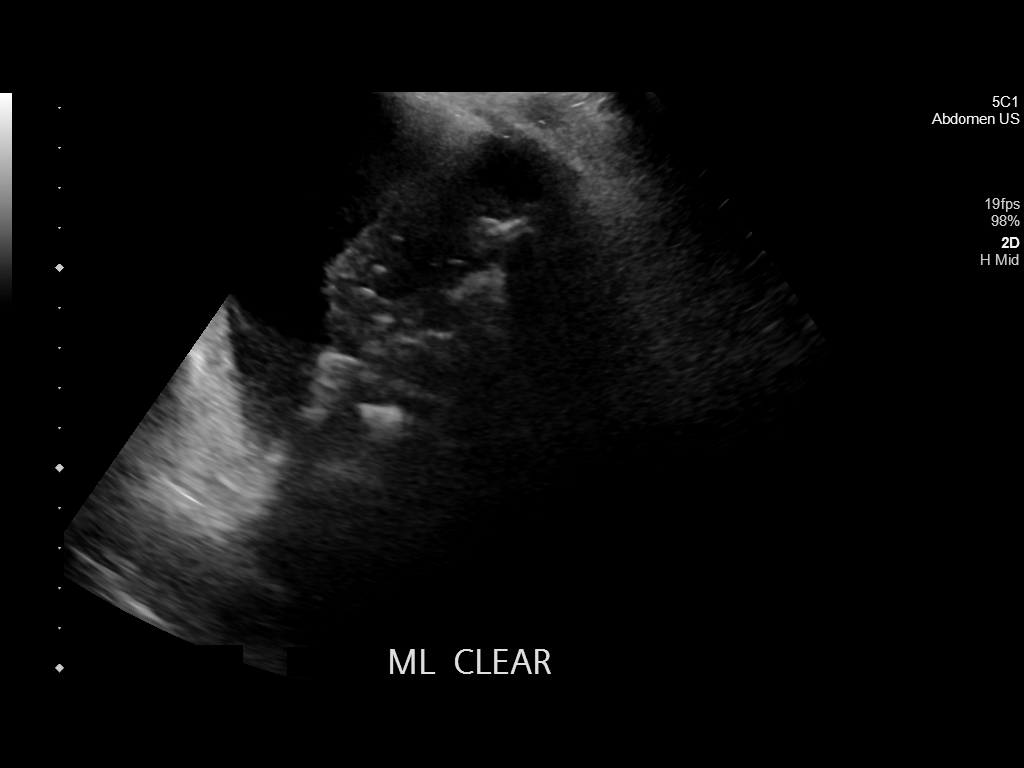

[4 of 4 positions shown; findings below may reference images not displayed]

EXAM:
ULTRASOUND GUIDED RIGHT THORACENTESIS

MEDICATIONS:
Local 1% lidocaine only.

COMPLICATIONS:
None immediate.

PROCEDURE:
An ultrasound guided thoracentesis was thoroughly discussed with the
patient and questions answered. The benefits, risks, alternatives
and complications were also discussed. The patient understands and
wishes to proceed with the procedure. Written consent was obtained.

Ultrasound was performed to localize and mark an adequate pocket of
fluid in the right chest. The area was then prepped and draped in
the normal sterile fashion. 1% Lidocaine was used for local
anesthesia. Under ultrasound guidance a 19 gauge, 7-cm, Yueh
catheter was introduced. Thoracentesis was performed. The catheter
was removed and a dressing applied.
FINDINGS: A total of approximately 1 L of amber colored fluid was removed.
Samples were sent to the laboratory as requested by the clinical
team.
IMPRESSION: Successful ultrasound guided right thoracentesis yielding 1 L of
pleural fluid.

This exam was performed by Mihret Maisonet, and was supervised
and interpreted by Dr. Samsoo.

## 2023-04-01 IMAGING — DX DG CHEST 1V PORT
1 series · 1 of 1 positions shown · non-contrast
Comparison: 11/13/2021

CLINICAL DATA: Atelectasis, history of small-bowel obstruction

EXAM:
PORTABLE CHEST 1 VIEW

[chest ap]
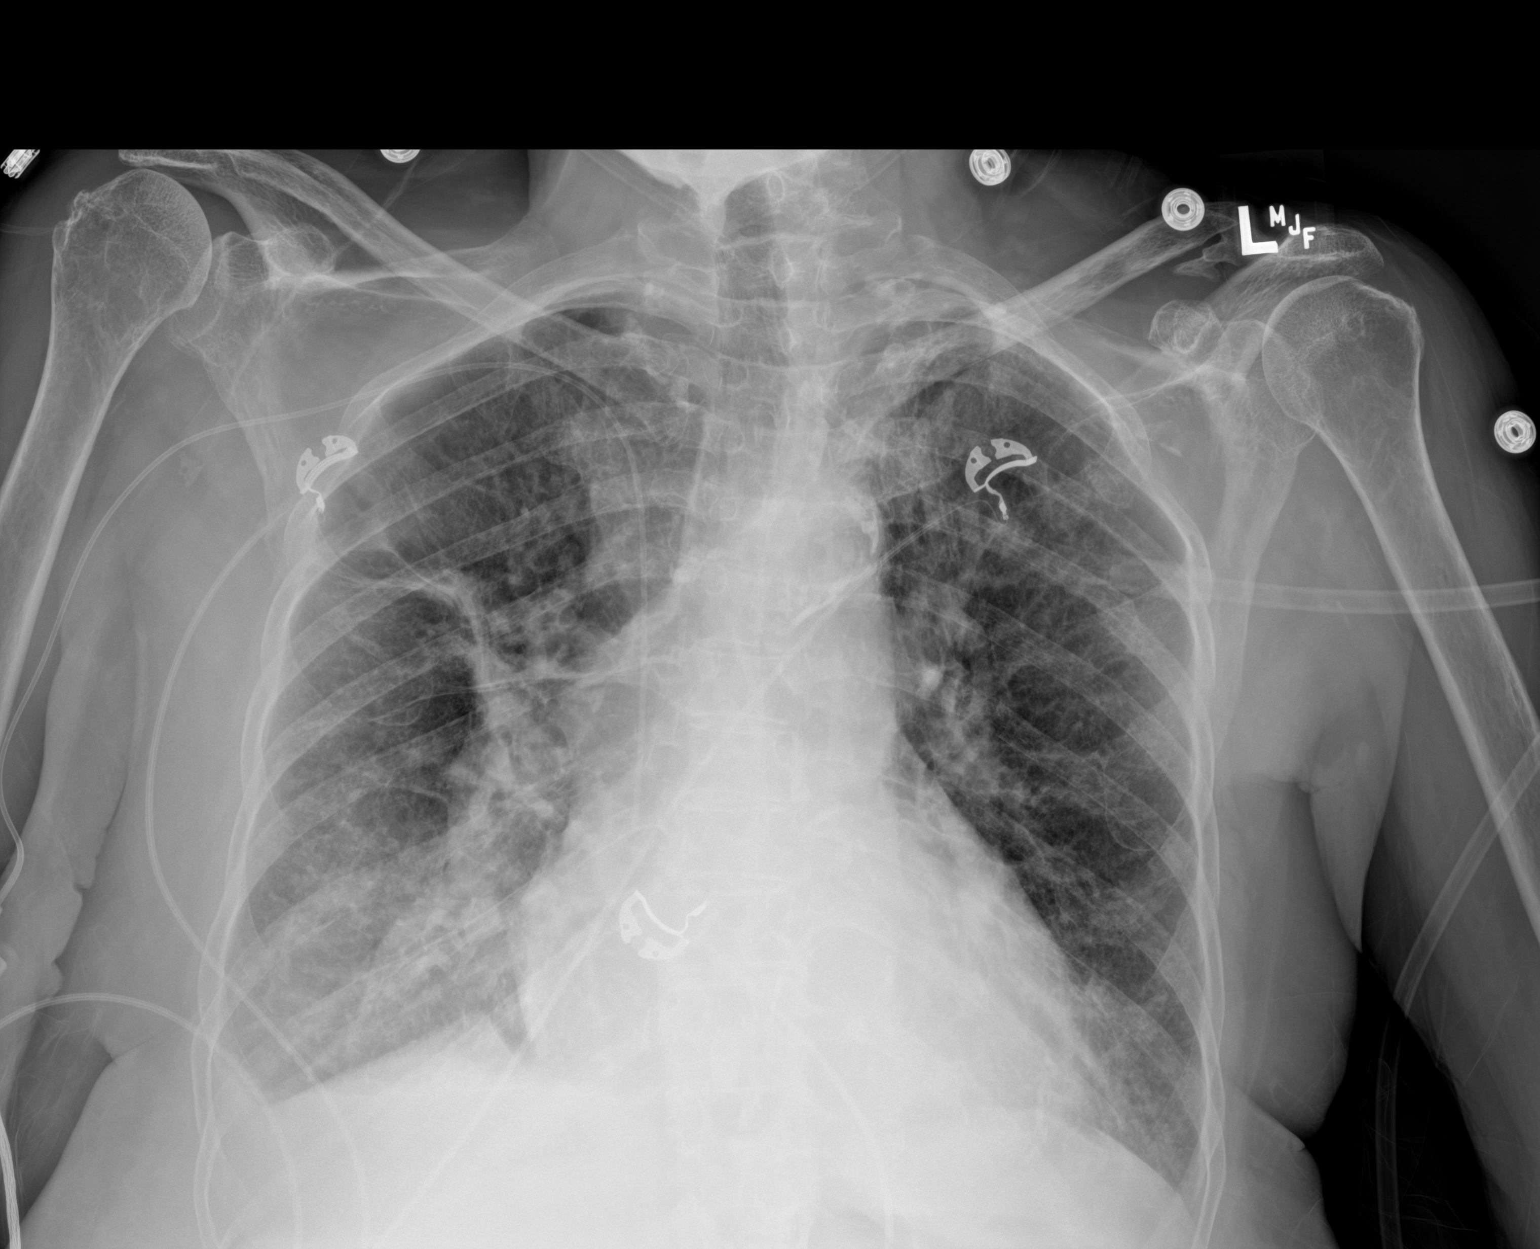

[1 of 1 positions shown; findings below may reference images not displayed]

FINDINGS: No significant change in AP portable chest radiograph, with
cardiomegaly and right upper extremity PICC. Diffuse bilateral
interstitial pulmonary opacity, small bilateral pleural effusions,
and bandlike scarring or atelectasis of the right midlung,
unchanged.
IMPRESSION: No significant change in AP portable chest radiograph, with
cardiomegaly, probable edema, and bilateral pleural effusions. No
new airspace opacity.

## 2023-04-05 IMAGING — CR DG CHEST 2V
1 series · 2 of 2 positions shown · non-contrast
Comparison: Chest x-rays dated 11/16/2021, 11/14/2021, 01/20/2020
and 07/31/2020.

CLINICAL DATA: Admitted for acute kidney injury, continued fatigue.
Recent pleural effusion on RIGHT.

EXAM:
CHEST - 2 VIEW

[Series 1: dg chest 2 view · 0.14mm/px · 2 of 2 slices shown]
[im 1/2]
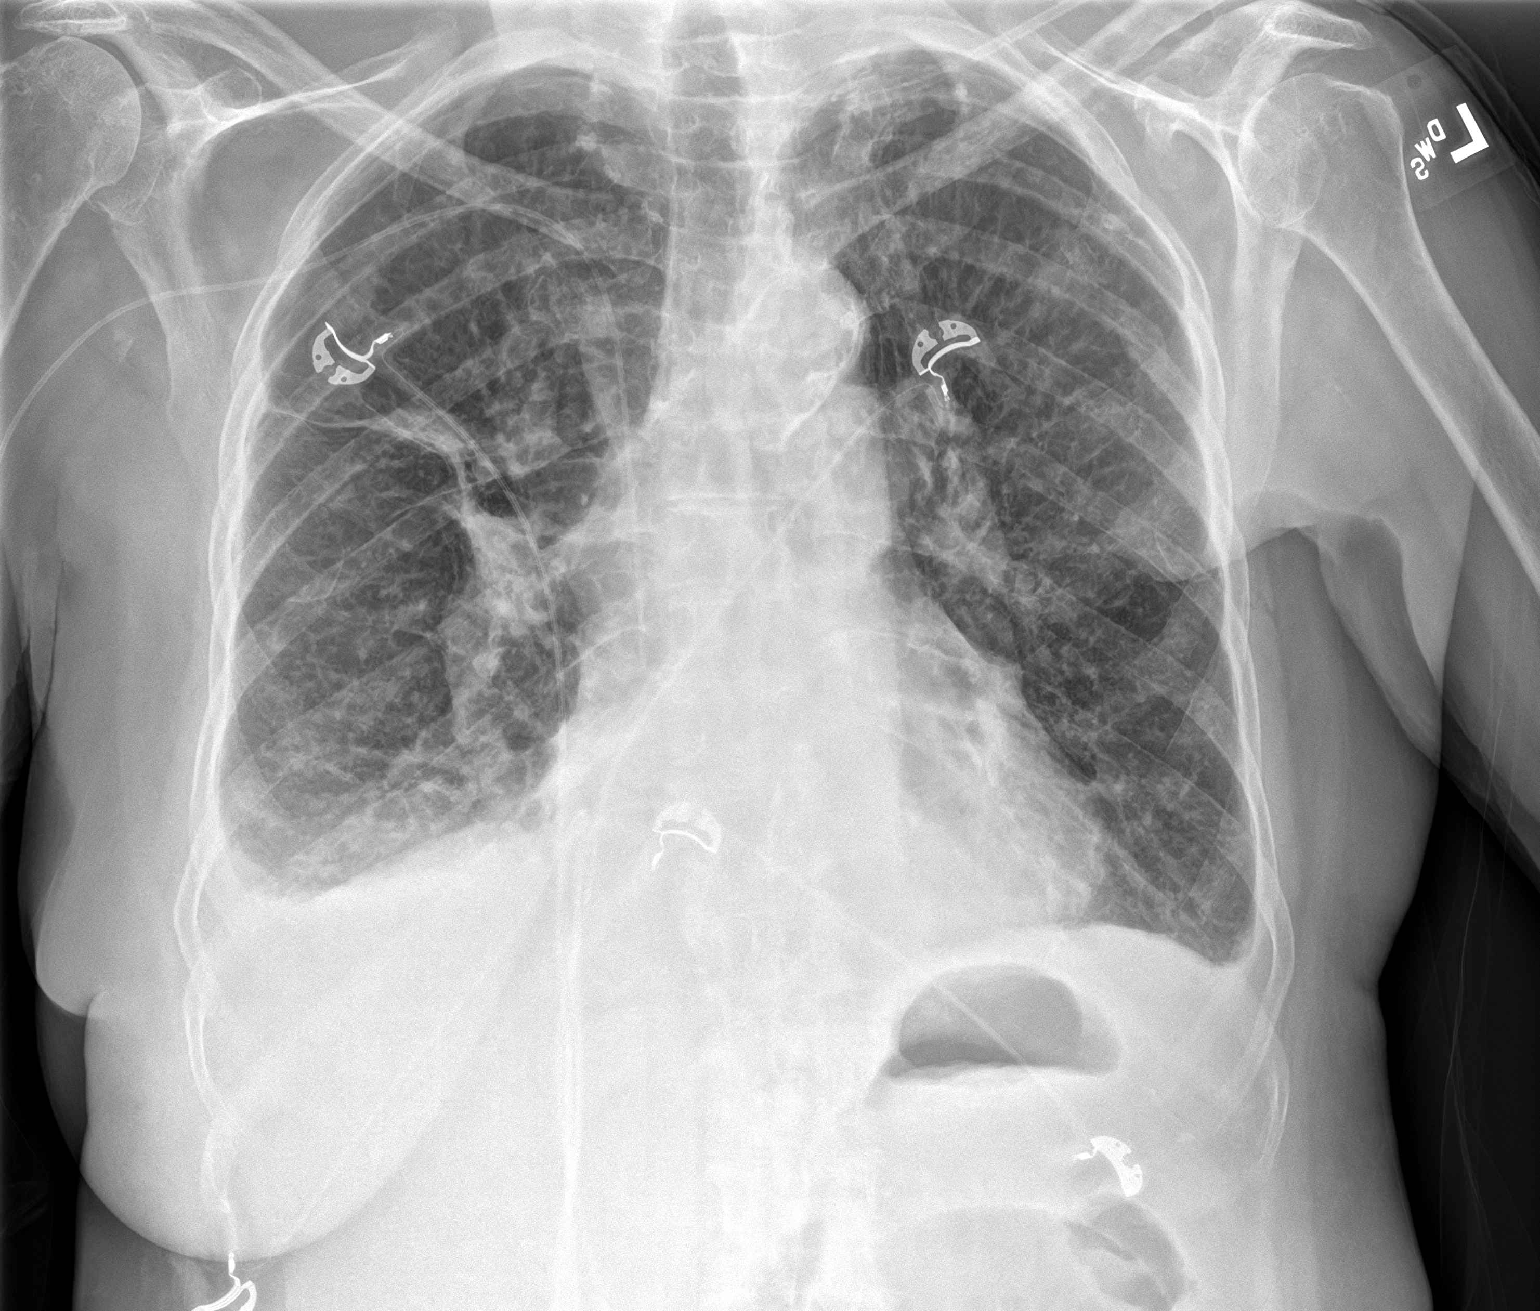
[im 2/2]
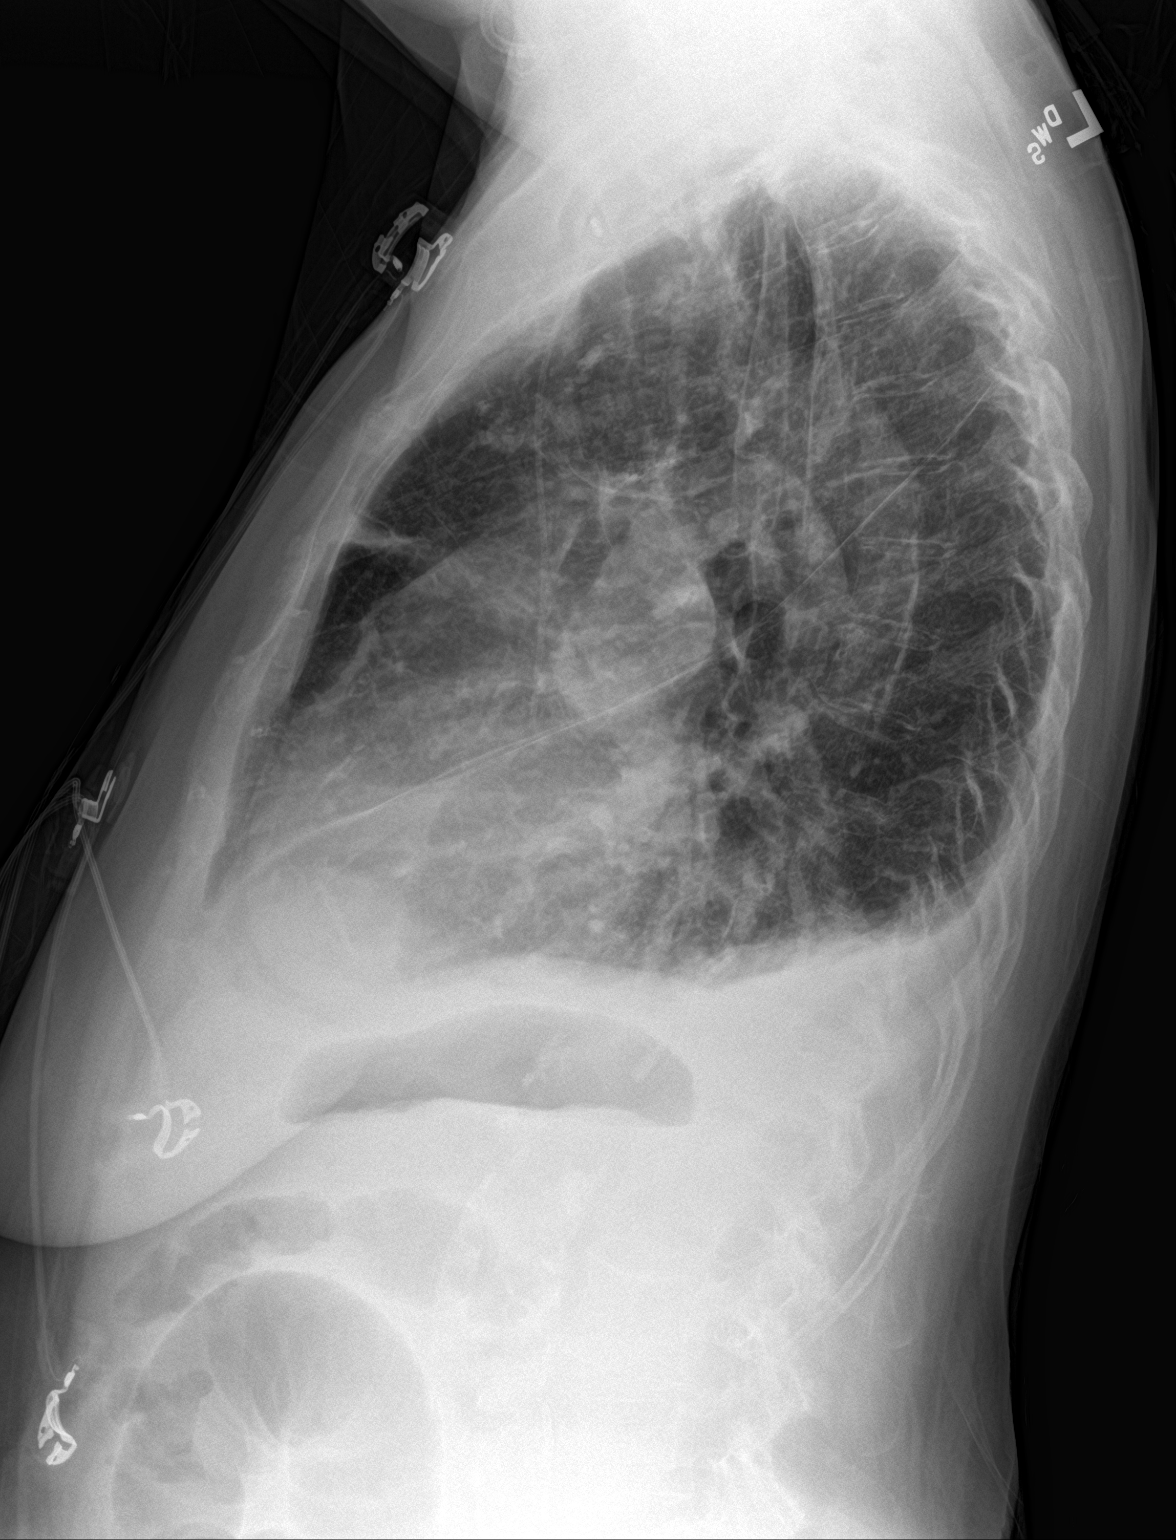

[2 of 2 positions shown; findings below may reference images not displayed]

FINDINGS: Heart size and mediastinal contours are stable. Chronic scarring is
again within the RIGHT perihilar lung. Coarse lung markings are seen
throughout both lungs. No confluent opacities to suggest
superimposed pneumonia. Small bilateral pleural effusions, RIGHT
greater than LEFT. No pneumothorax is seen.

RIGHT-sided PICC line appears well positioned with tip at the level
of the lower SVC/cavoatrial junction. No acute or suspicious osseous
abnormality. Extensive aortic atherosclerosis. Mildly distended
gas-filled loops of small bowel within the upper abdomen,
incompletely imaged.
IMPRESSION: 1. Small bilateral pleural effusions, RIGHT greater than LEFT.
2. Coarse lung markings throughout both lungs, compatible with
chronic interstitial lung disease/fibrosis. No evidence of
superimposed pneumonia or pulmonary edema.
3. Aortic atherosclerosis.
4. Suspect continued postoperative ileus with mildly distended
gas-filled loops of small bowel partially imaged within the upper
abdomen.

## 2023-04-08 IMAGING — DX DG CHEST 1V PORT
1 series · 1 of 1 positions shown · non-contrast
Comparison: 11/18/2021

CLINICAL DATA: Pulmonary edema, COPD

EXAM:
PORTABLE CHEST 1 VIEW

[chest ap]
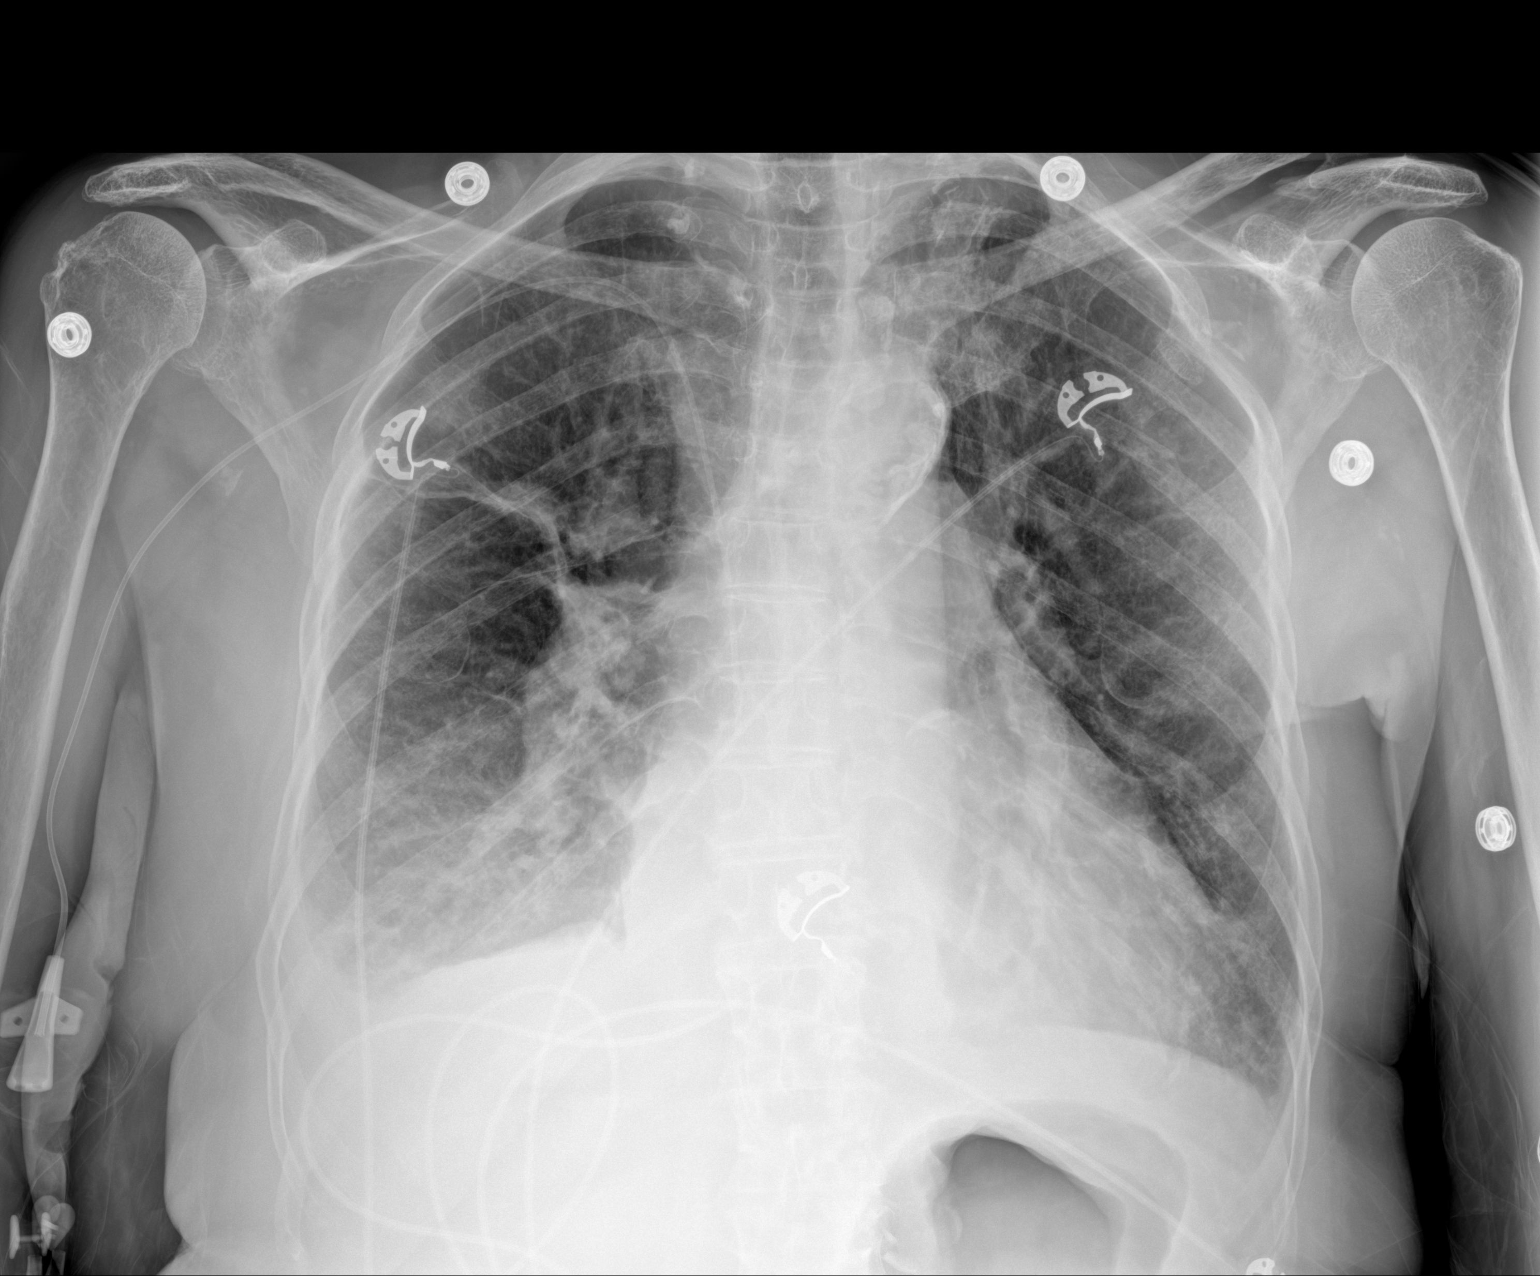

[1 of 1 positions shown; findings below may reference images not displayed]

FINDINGS: Stable positioning of a right-sided PICC line. Stable mild
cardiomegaly. Aortic atherosclerosis. Chronic scarring in the right
perihilar region. Increasing right lower lobe airspace opacity.
Small right-sided pleural effusion and trace left pleural effusion.
No pneumothorax.
IMPRESSION: 1. Worsening right lower lobe airspace opacity, suspicious for
pneumonia.
2. Small right and trace left pleural effusions.

## 2023-04-09 IMAGING — CT CT CHEST W/O CM
2 of 4 series · 14 of 36 positions shown, 17 images · non-contrast
Comparison: AP chest 11/21/2021; chest two views 11/18/2021; CT
chest 11/06/2019 and 11/19/2018

CLINICAL DATA: Pneumonia. Pleural effusion suspected. Evaluate
lungs for fluid.



[Series 2: thorax · axial · 0.65mm/px · z∈[-1148,-870]mm · 11 of 165 slices shown, 14 images]
[im 13/165  mediastinal]
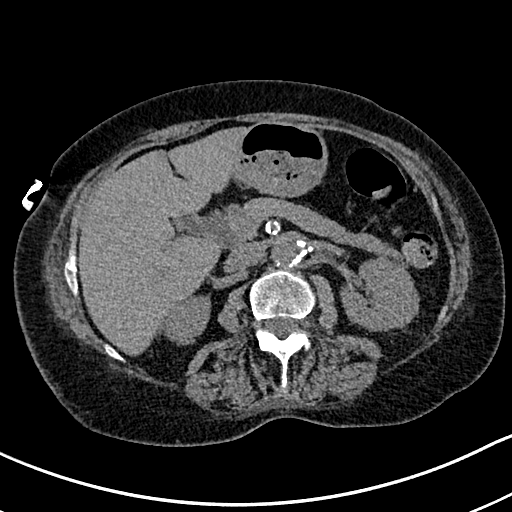
[im 13/165  lung]
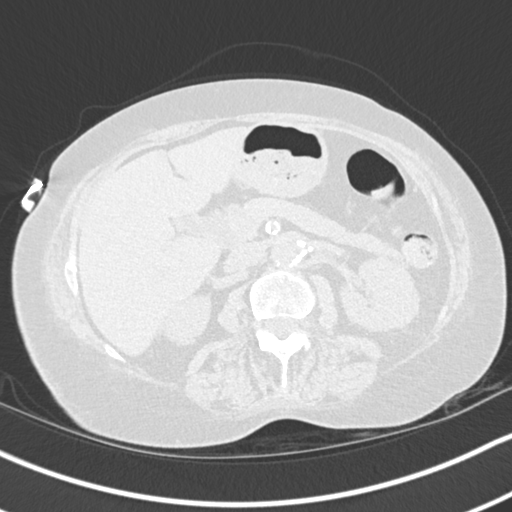
[im 26/165  lung]
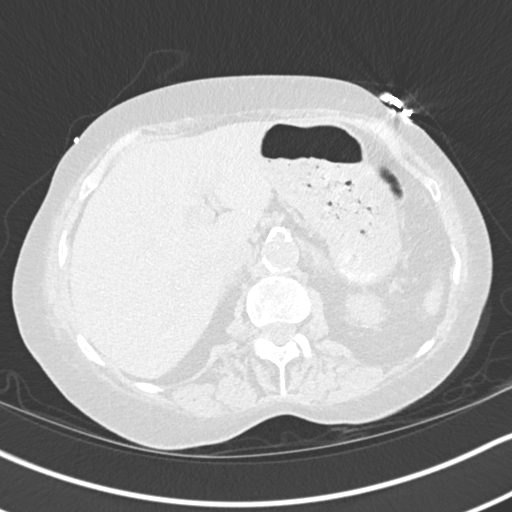
[im 38/165  lung]
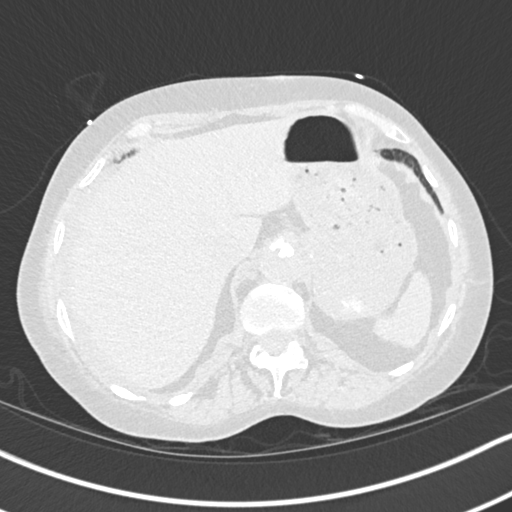
[im 51/165  lung]
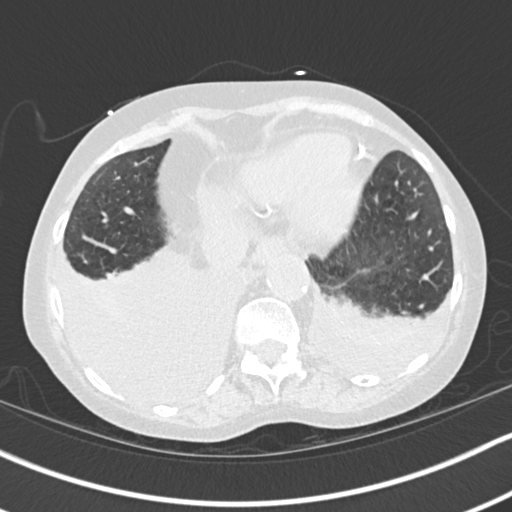
[im 64/165  mediastinal]
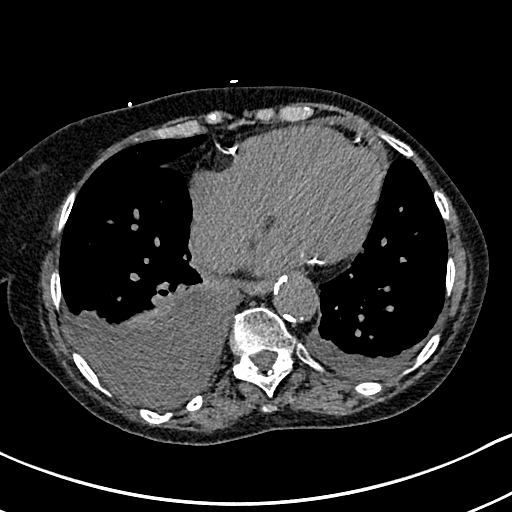
[im 64/165  lung]
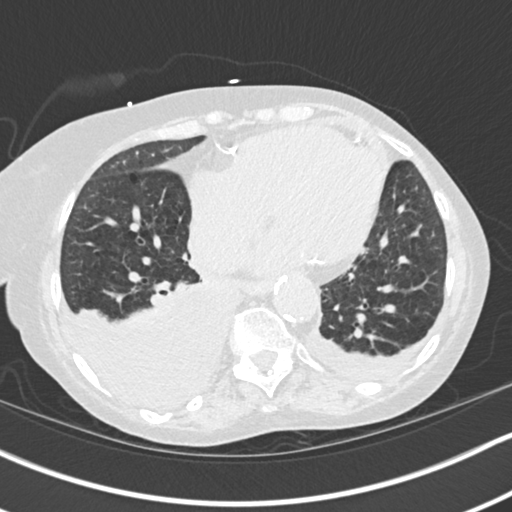
[im 89/165  lung]
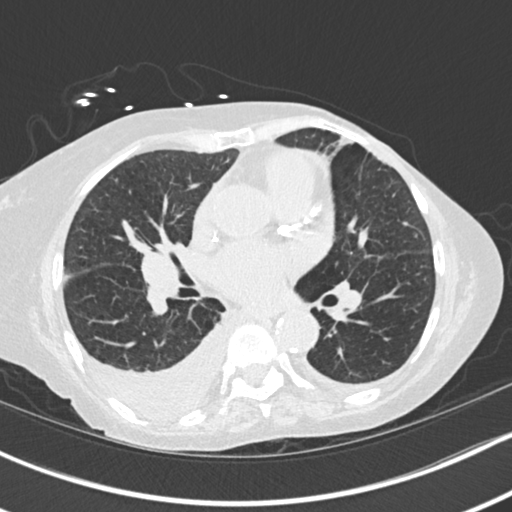
[im 101/165  lung]
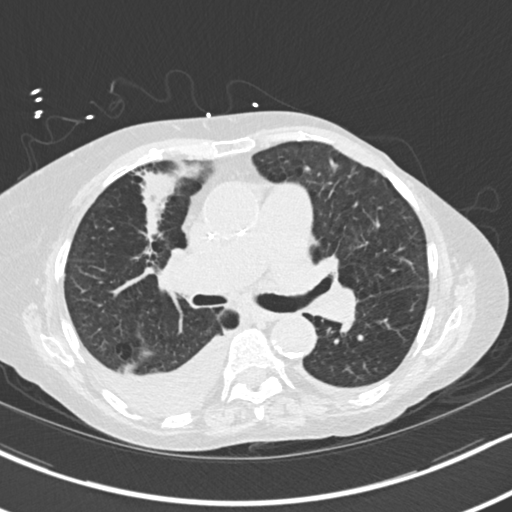
[im 114/165  lung]
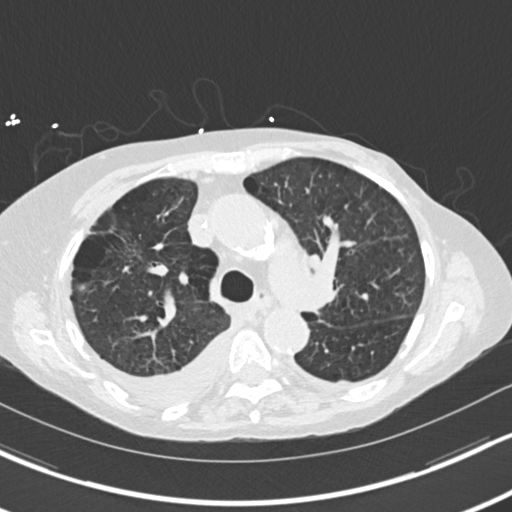
[im 127/165  mediastinal]
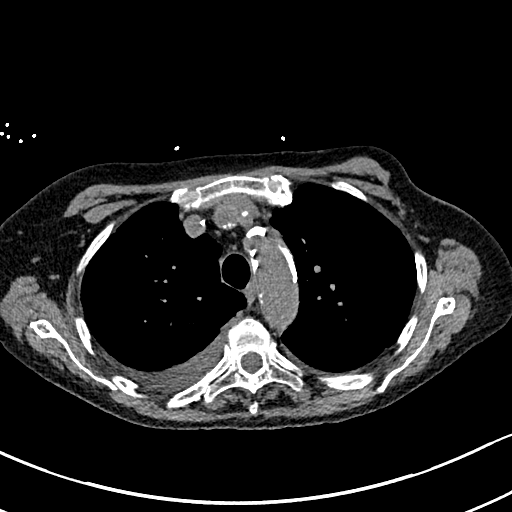
[im 127/165  lung]
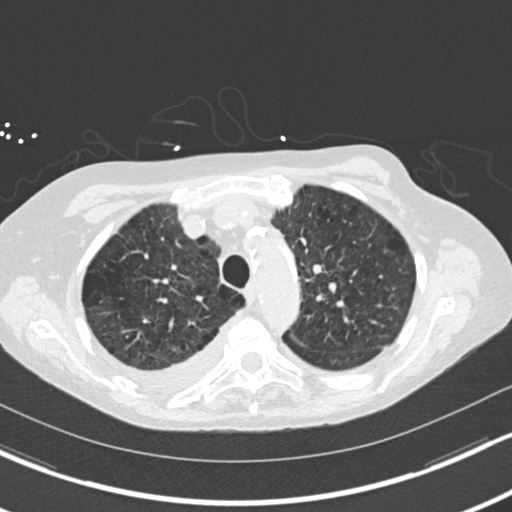
[im 139/165  lung]
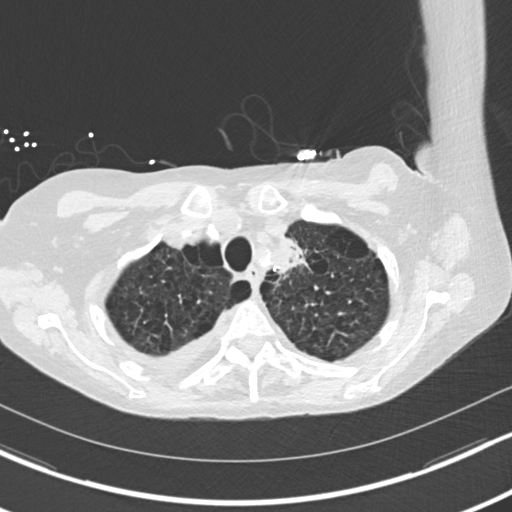
[im 152/165  lung]
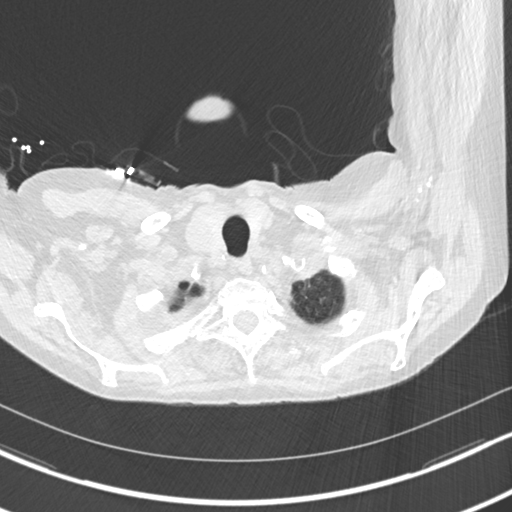

[Series 5: coronal · coronal · 0.63mm/px · 3 of 119 slices shown]
[im 24/119  lung]
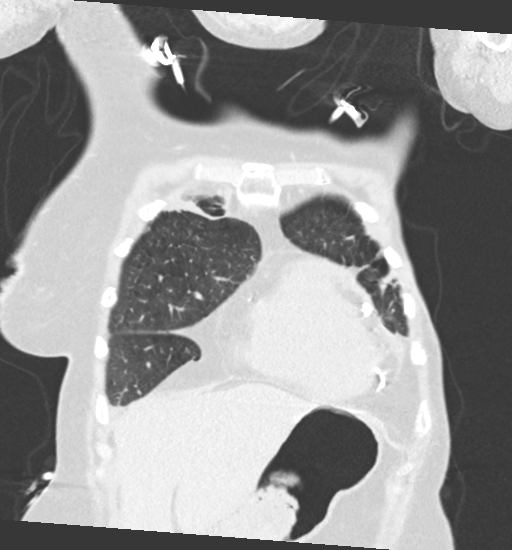
[im 48/119  lung]
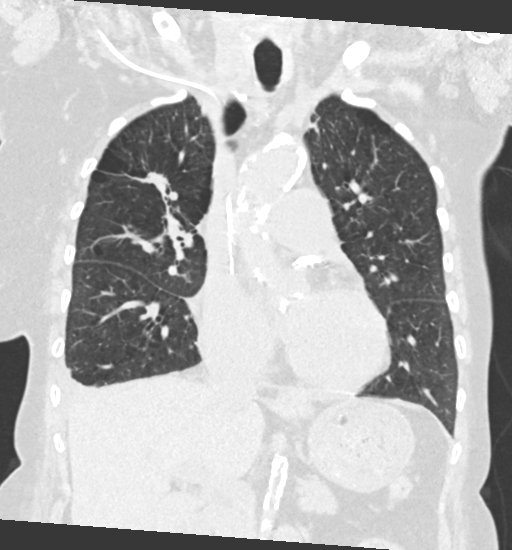
[im 71/119  lung]
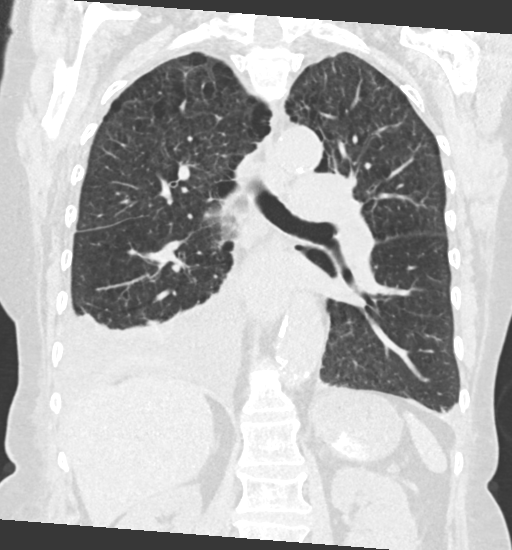

[14 of 36 positions shown; findings below may reference images not displayed]

FINDINGS: Cardiovascular: Heart size is moderately enlarged similar to prior.
Mild pericardial effusion is new from 11/06/2019. Dense coronary
artery calcifications are seen. The ascending aorta measures up to
3.4 cm in caliber, ectatic but not aneurysmal. Moderate to
high-grade atherosclerotic calcifications of the thoracic aorta.

Mediastinum/Nodes: No axillary, mediastinal, or hilar pathologically
enlarged lymph nodes by CT criteria. Visualized thyroid gland is
grossly unremarkable. The esophagus follows a normal course of
normal caliber.

Lungs/Pleura: The central airways are patent. Moderate to high-grade
centrilobular emphysematous changes are again seen. Moderate apical
paraseptal emphysematous changes.

A right upper lobe opacity on the prior CT series 6, image 21
appears to have resolved. Contiguous more inferior right upper lobe
more superior (axial image 56 and sagittal image 51) and inferior
(axial image 68 and sagittal image 56) pulmonary opacities extending
to the right hilum appears similar to prior with the more inferior
opacity that contacts the anterior lung subjectively appearing
thinner and with "less fullness" (axial images 62 through 70).

Benign partially calcified 3 mm granuloma within the medial left
lower lobe (axial series 3, image 69).a mild anterior left upper
lobe subpleural thickening is unchanged (axial series 3, image 70).

Moderate to high-grade right and mild to moderate left pleural
effusions, increased from 11/06/2019.

Upper Abdomen: Unchanged fluid density possible 11 mm cyst within
the anterior right hepatic lobe.

Musculoskeletal: Mild-to-moderate multilevel degenerative disc
changes of the thoracic spine. Minimal dextrocurvature of the
midthoracic spine. Old healed anterior left second rib fracture.
IMPRESSION: :
IMPRESSION: 1. Moderate cardiomegaly, unchanged.  New mild pericardial effusion.
2. Coronary artery calcifications, an indicator of coronary artery
disease.
3. Moderate to high-grade centrilobular emphysematous changes.
4. Moderate to high-grade right and mild-to-moderate left pleural
effusions, increased from 11/06/2019.
5. Resolution of a superior right upper lobe opacity seen on prior
11/06/2019 CT.
6. No significant change in more inferior right upper lobe 2
contiguous regions of somewhat linear density, possible scarring
from reported radiation therapy treated mass.

Aortic Atherosclerosis ([id number].M) and Emphysema ([id number].F).

## 2023-04-09 IMAGING — US US THORACENTESIS ASP PLEURAL SPACE W/IMG GUIDE
1 series · 6 of 6 positions shown · non-contrast
Comparison: none

INDICATION: Patient with history of CKD, COPD, admitted s/p ileostomy reversal
now with persistent right pleural effusion. Request made for
therapeutic right thoracentesis.

[Series 1: us thoracentesis asp pleural space w/img guide · 0.25mm/px · 6 of 6 slices shown]
[im 1/6]
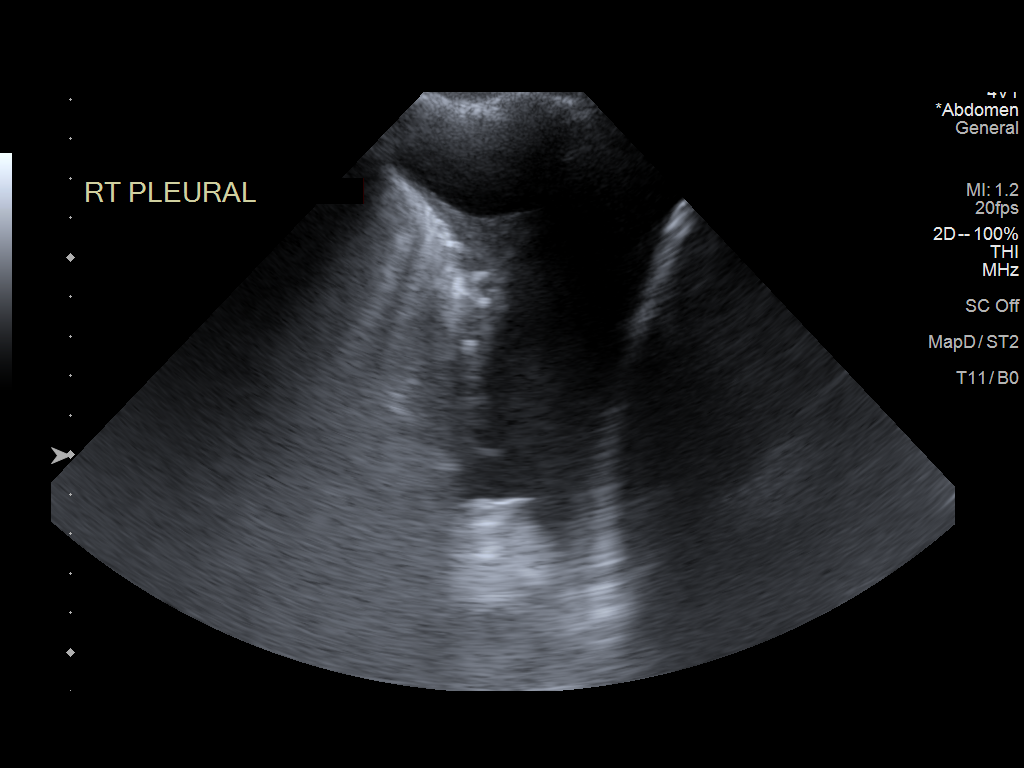
[im 2/6]
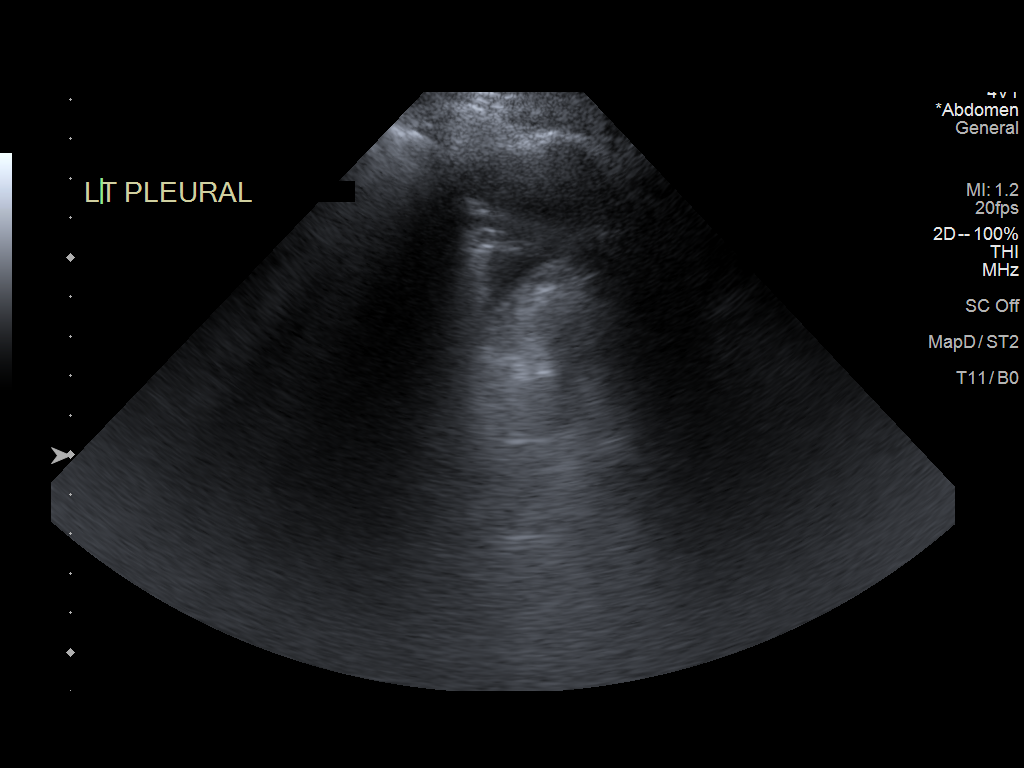
[im 3/6]
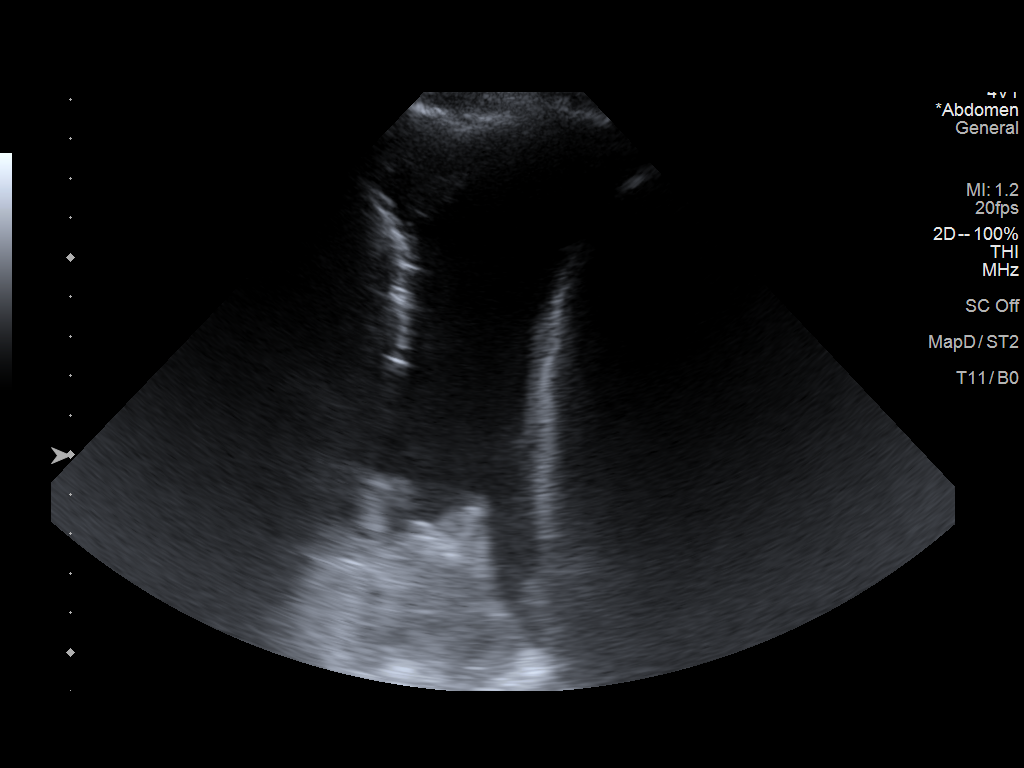
[im 4/6]
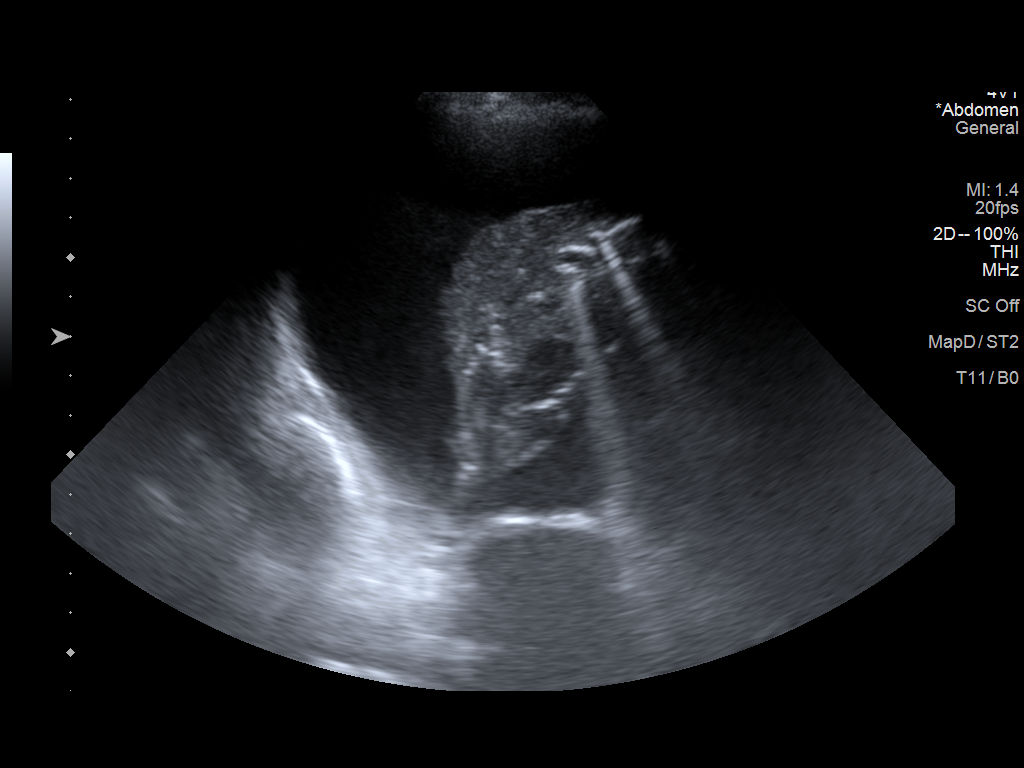
[im 5/6]
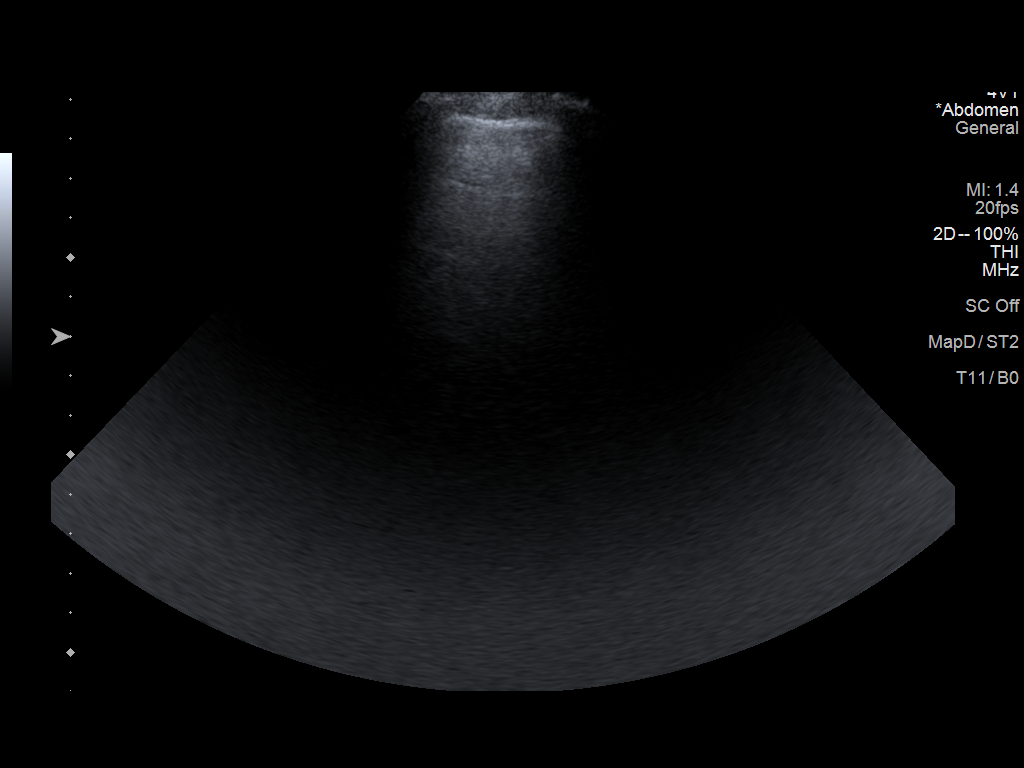
[im 6/6]
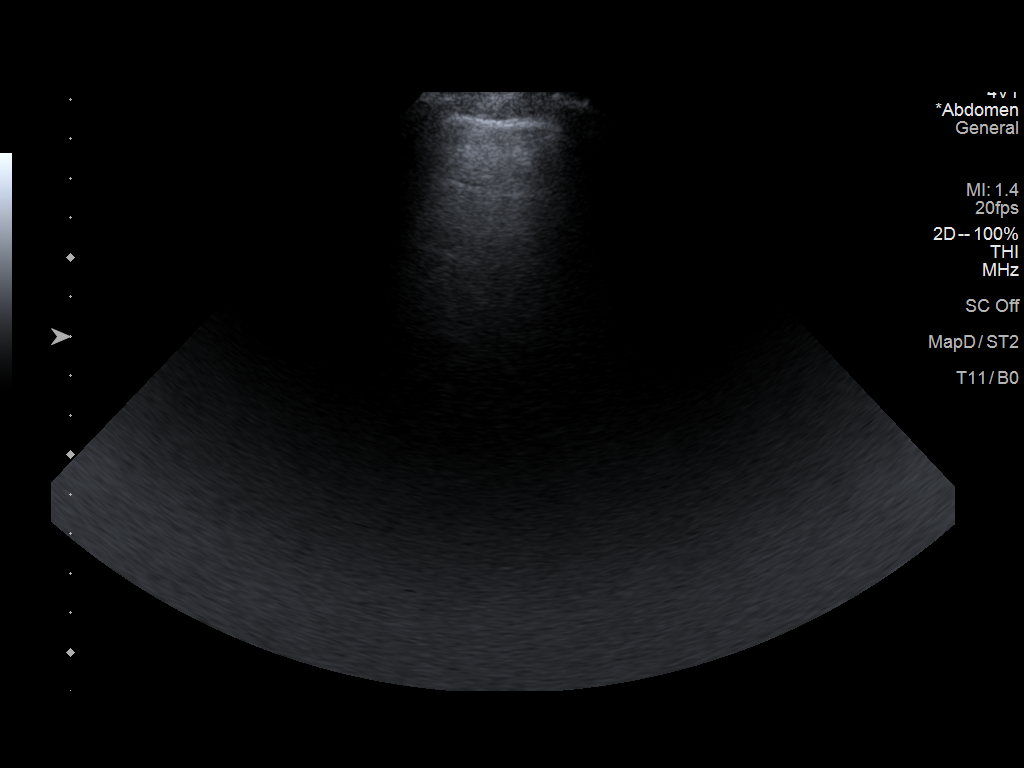

[6 of 6 positions shown; findings below may reference images not displayed]

EXAM:
ULTRASOUND GUIDED RIGHT THORACENTESIS

MEDICATIONS:
10 mL 1% lidocaine

COMPLICATIONS:
None immediate.

PROCEDURE:
An ultrasound guided thoracentesis was thoroughly discussed with the
patient and questions answered. The benefits, risks, alternatives
and complications were also discussed. The patient understands and
wishes to proceed with the procedure. Written consent was obtained.

Ultrasound was performed to localize and mark an adequate pocket of
fluid in the right chest. The area was then prepped and draped in
the normal sterile fashion. 1% Lidocaine was used for local
anesthesia. Under ultrasound guidance a 6 Fr Safe-T-Centesis
catheter was introduced. Thoracentesis was performed. The catheter
was removed and a dressing applied.
FINDINGS: A total of approximately 550 mL of amber fluid was removed.
IMPRESSION: Successful ultrasound guided right therapeutic thoracentesis
yielding 550 mL of pleural fluid.

## 2023-04-09 IMAGING — DX DG CHEST 1V PORT
1 series · 1 of 1 positions shown · non-contrast
Comparison: Earlier today.

CLINICAL DATA: Status post right thoracentesis.

EXAM:
PORTABLE CHEST 1 VIEW

[chest ap]
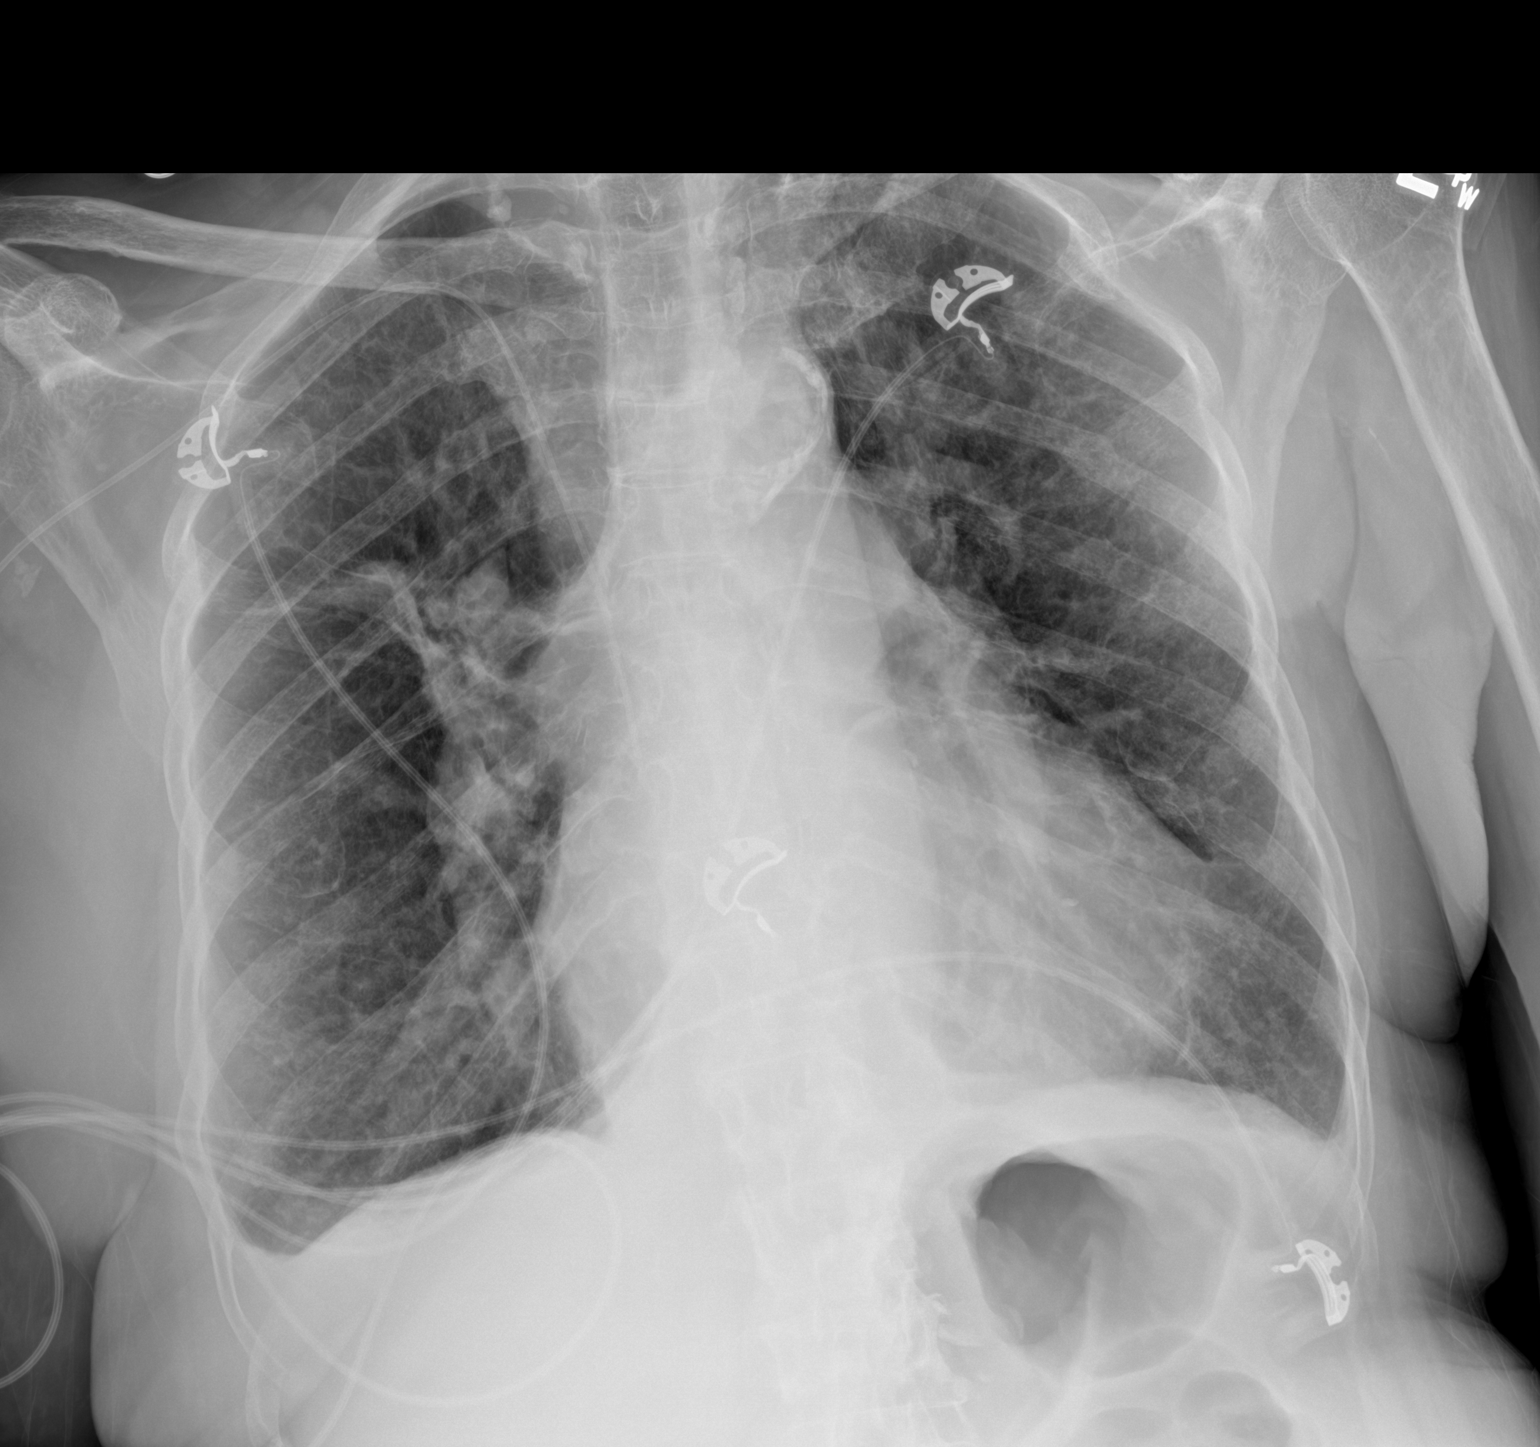

[1 of 1 positions shown; findings below may reference images not displayed]

FINDINGS: Interval decrease in right basilar pleural fluid with no
pneumothorax seen. Resolved atelectasis at the right lung base.
Stable minimal left pleural effusion. Stable enlarged cardiac
silhouette, tortuous and calcified thoracic aorta and linear
scarring in the right upper lung zone. Right PICC tip in the region
of the superior cavoatrial junction/inferior aspect of the superior
vena cava. Mild increase in mild linear atelectasis at the medial
left lung base. No acute bony abnormality.
IMPRESSION: 1. Decreased right pleural fluid without pneumothorax following
thoracentesis.
2. Resolved right basilar atelectasis.
3. Mild increase in mild left lower lobe atelectasis.
4. Minimal left pleural effusion, small residual right pleural
effusion and stable right upper lung zone scarring.

## 2023-04-13 IMAGING — CT CT ABD-PELV W/O CM
2 of 4 series · 15 of 46 positions shown, 17 images · non-contrast
Comparison: 11/05/2021.

CLINICAL DATA: Abdominal pain.



[Series 2: routine abd/pel wo · axial · 0.73mm/px · z∈[-1019,-664]mm · 12 of 79 slices shown, 14 images]
[im 4/79  soft-tissue]
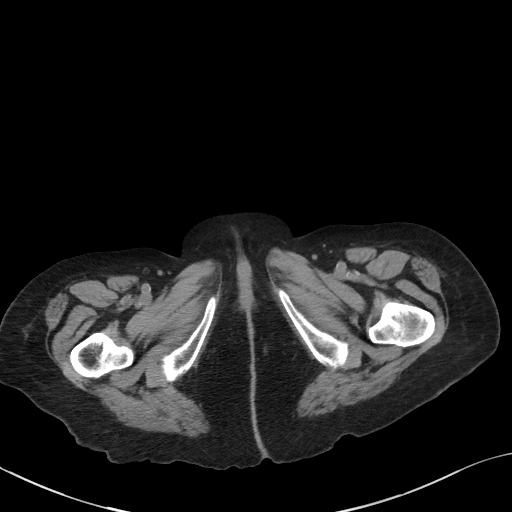
[im 4/79  bone]
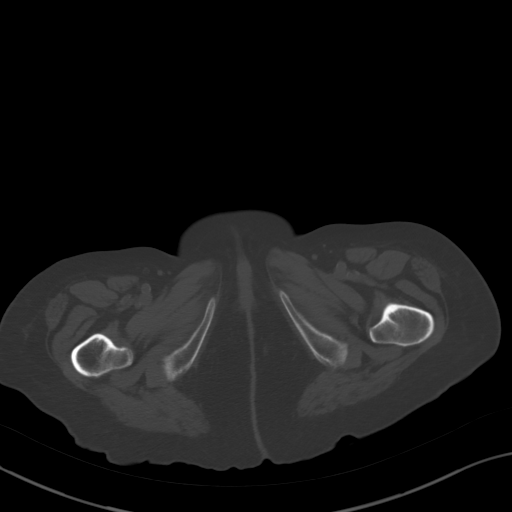
[im 10/79  soft-tissue]
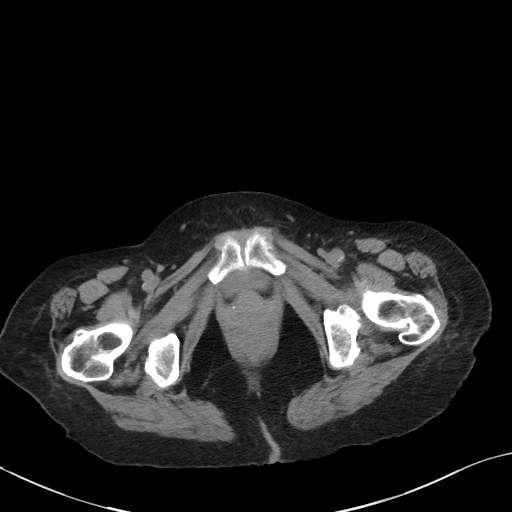
[im 17/79  soft-tissue]
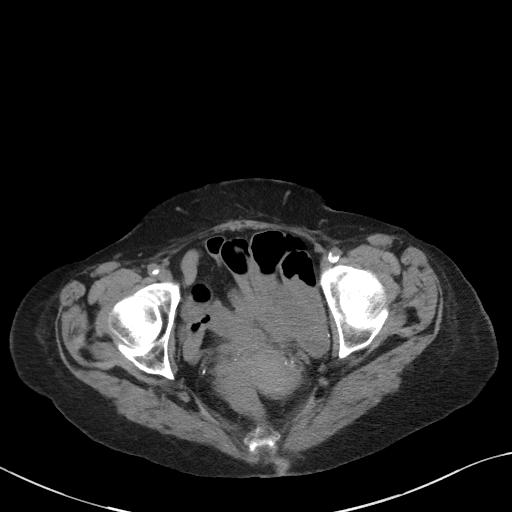
[im 23/79  soft-tissue]
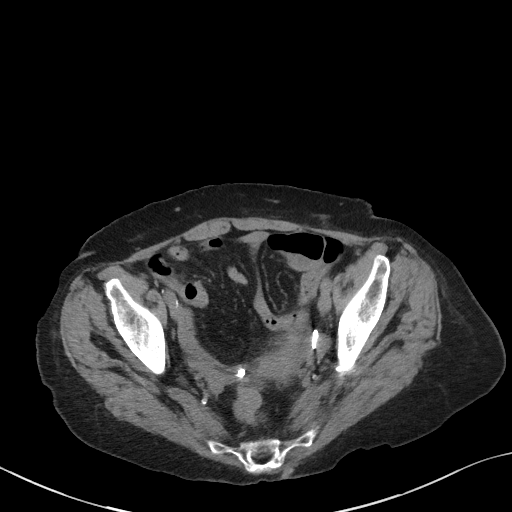
[im 30/79  soft-tissue]
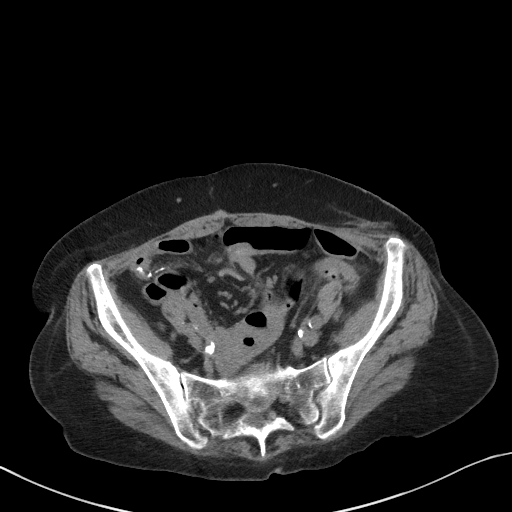
[im 36/79  soft-tissue]
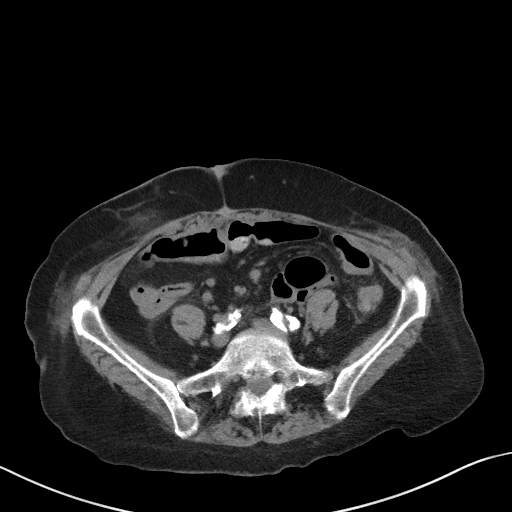
[im 43/79  soft-tissue]
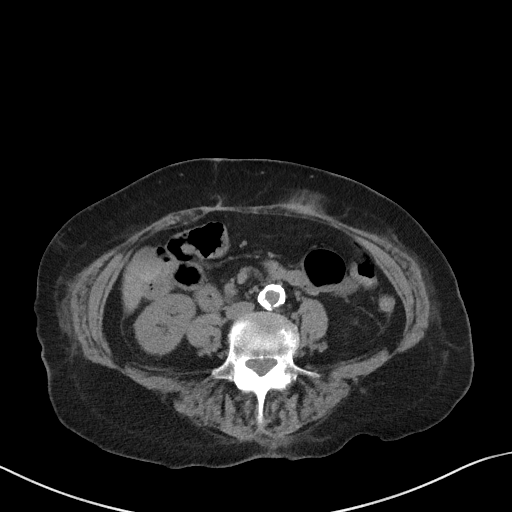
[im 49/79  soft-tissue]
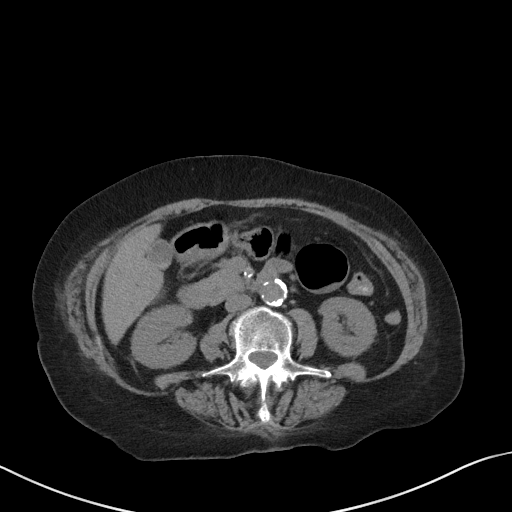
[im 56/79  soft-tissue]
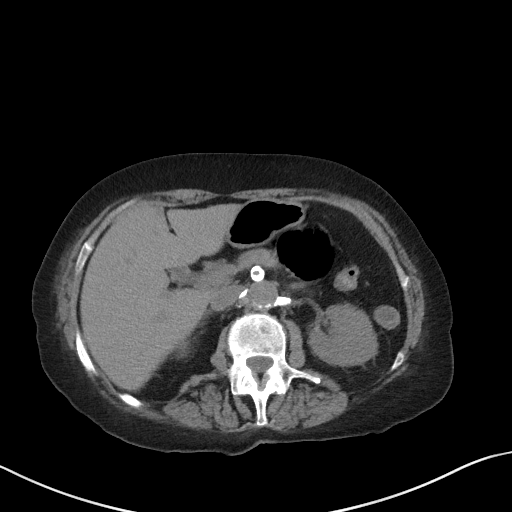
[im 56/79  bone]
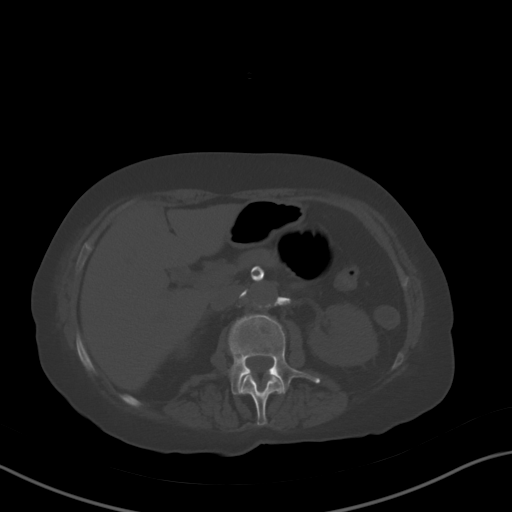
[im 62/79  soft-tissue]
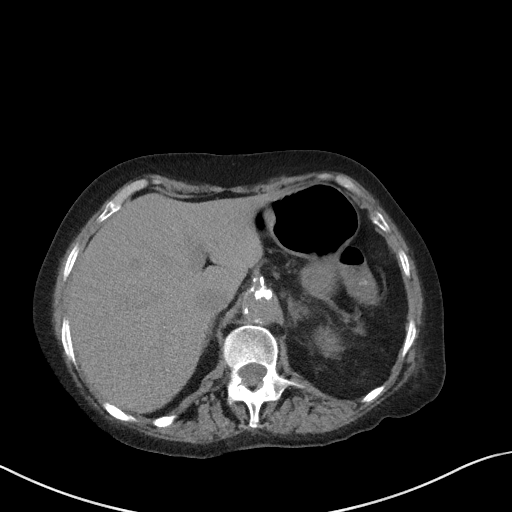
[im 69/79  soft-tissue]
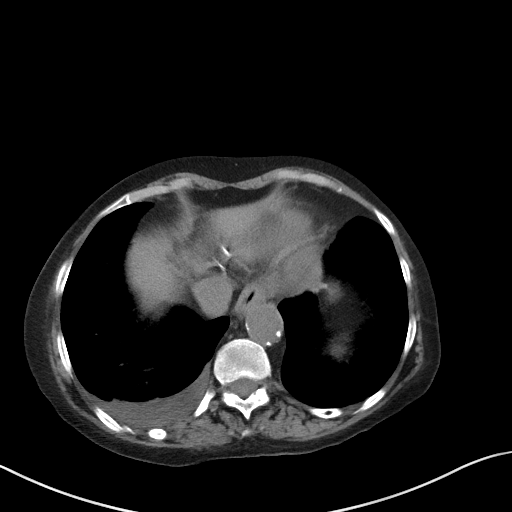
[im 75/79  soft-tissue]
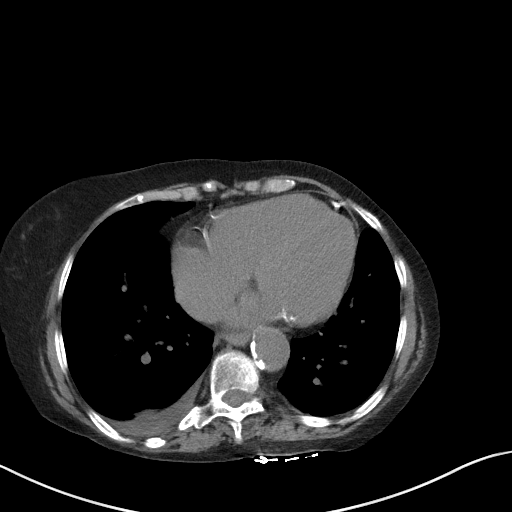

[Series 5: coronal st · coronal · 0.63mm/px · 3 of 81 slices shown]
[im 27/81  soft-tissue]
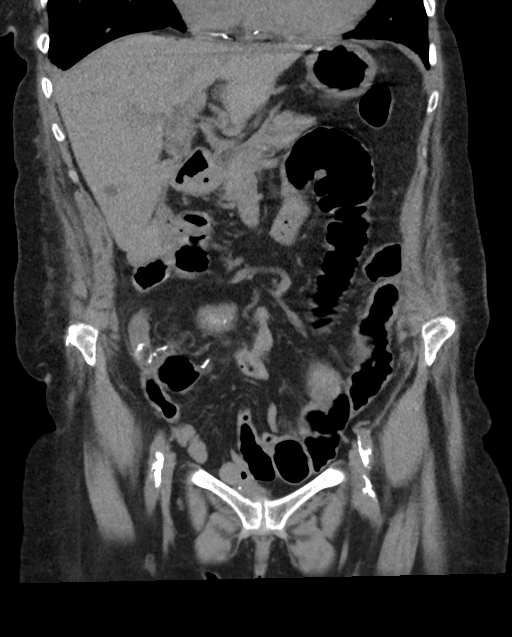
[im 36/81  soft-tissue]
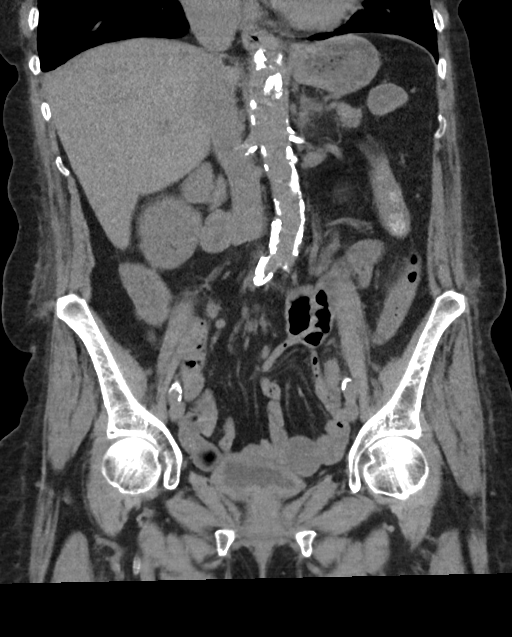
[im 45/81  soft-tissue]
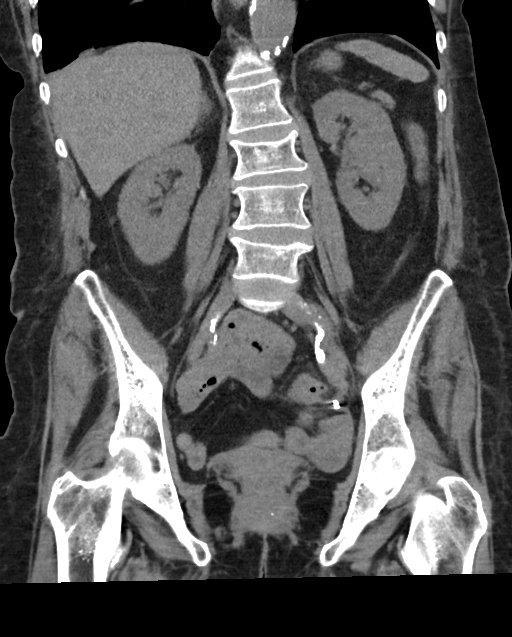

[15 of 46 positions shown; findings below may reference images not displayed]

FINDINGS: Lower chest: Small right pleural effusion, decreased compared to the
prior CT. No acute findings at the lung bases.

Hepatobiliary: Liver normal in size and overall attenuation. 9 mm
low-attenuation lesion, segment 4A. Smaller similar appearing
low-attenuation lesion, segment 6, both consistent with cysts and
both stable. No other liver masses or lesions. Small amount of
dependent material in the gallbladder consistent with sludge or
small stones. No wall thickening or inflammation. No bile duct
dilation.

Pancreas: Unremarkable. No pancreatic ductal dilatation or
surrounding inflammatory changes.

Spleen: Normal in size without focal abnormality.

Adrenals/Urinary Tract: No adrenal masses. Kidneys normal size,
orientation and position with no masses, stones or hydronephrosis.
Normal ureters. Bladder mostly decompressed, otherwise unremarkable.

Stomach/Bowel: Subtle hazy inflammatory type change adjacent to the
lower descending colon centered on a posterior diverticulum. The
apparent plan [REDACTED] changes new from prior CT. There is also subtle
haziness adjacent to the lower sigmoid colon where there are several
diverticula. There is no extraluminal or free air. There are no
fluid collections to suggest an abscess. Colon anastomosis staple
line noted in the low central to right lower quadrant abdomen.

Normal stomach. Small bowel normal in caliber. No small bowel wall
thickening or inflammation.

Vascular/Lymphatic: Extensive aortic and branch vessel
atherosclerotic calcifications. No aneurysm. No enlarged lymph
nodes.

Reproductive: Uterus normal size. Several small calcifications. No
adnexal masses.

Other: Ill-defined increased attenuation in the subcutaneous soft
tissues of the left mid abdomen and right lower quadrant, improved
compared to the prior CT, consistent with previous ostomy sites or
laparoscopic insertion sites. No abdominal wall hernia. No ascites.

Musculoskeletal: Mild chronic depression of the upper endplate of L3
and upper endplate of L5. No acute fractures. No bone lesions.
IMPRESSION: 1. Subtle haziness is noted adjacent to the lower descending colon,
with another area adjacent to the lower sigmoid colon, in both
locations there are associated diverticula. Findings may reflect
mild uncomplicated diverticulitis in either or both locations. No
extraluminal or free air. No evidence of an abscess.
2. No other evidence of an acute abnormality within the abdomen or
pelvis. No evidence of bowel obstruction.
3. Small right pleural effusion decreased in size from the prior CT.
4. Extensive aortic and branch vessel atherosclerosis, also stable.

## 2023-06-13 IMAGING — DX DG CHEST 1V
1 series · 1 of 1 positions shown · non-contrast
Comparison: 11/22/2021

CLINICAL DATA: Chest pain and tachycardia.

EXAM:
CHEST  1 VIEW

[chest ap]
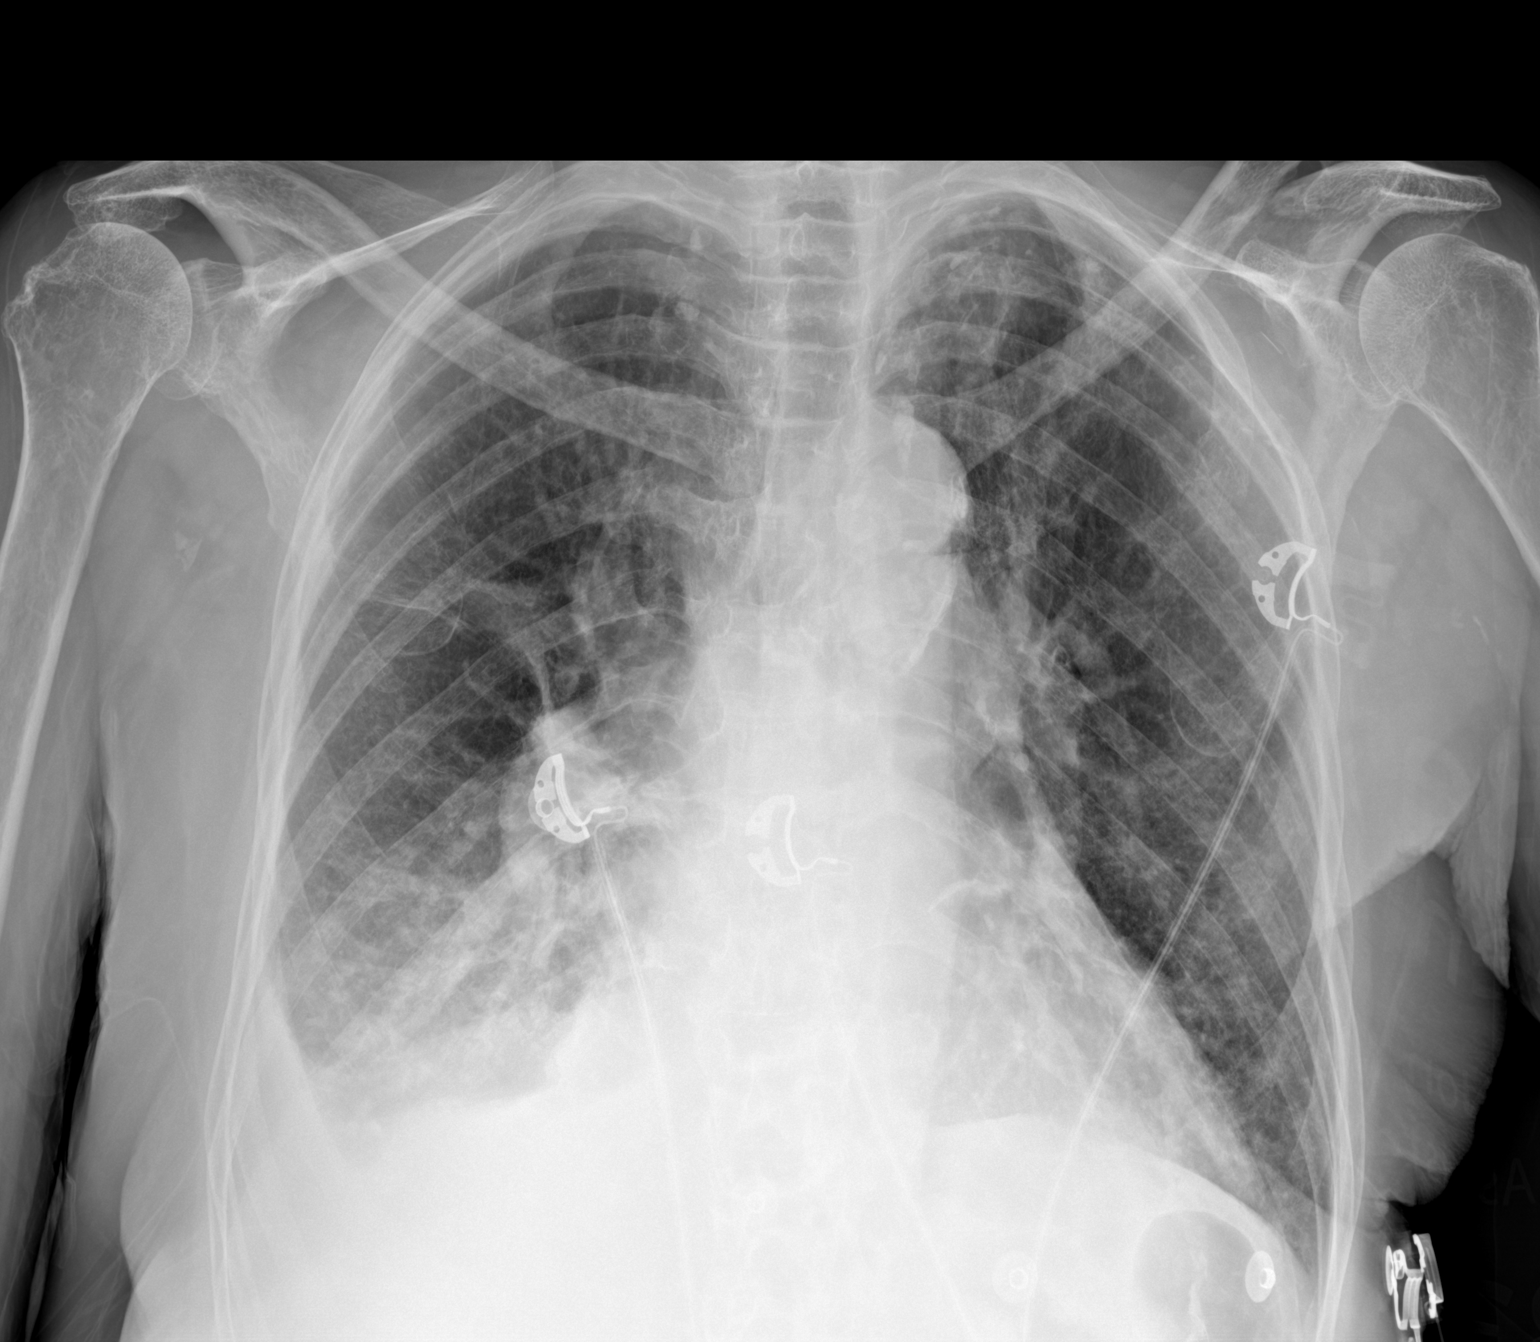

[1 of 1 positions shown; findings below may reference images not displayed]

FINDINGS: Heart size is normal. Aortic atherosclerosis. Diffuse pulmonary
vascular congestion. Increased volume of right pleural effusion with
veil like opacification over the right lower lobe is noted. Post
treatment changes within the perihilar right upper lobe appears
stable in the interval.
IMPRESSION: 1. Increased volume of right pleural effusion with veil like
opacification over the right lower lobe.
2. Pulmonary vascular congestion.

## 2023-06-15 IMAGING — DX DG CHEST 1V PORT
1 series · 1 of 1 positions shown · non-contrast
Comparison: 01/26/2022 and older studies.

CLINICAL DATA: Status post right thoracentesis.

EXAM:
PORTABLE CHEST 1 VIEW

[chest ap]
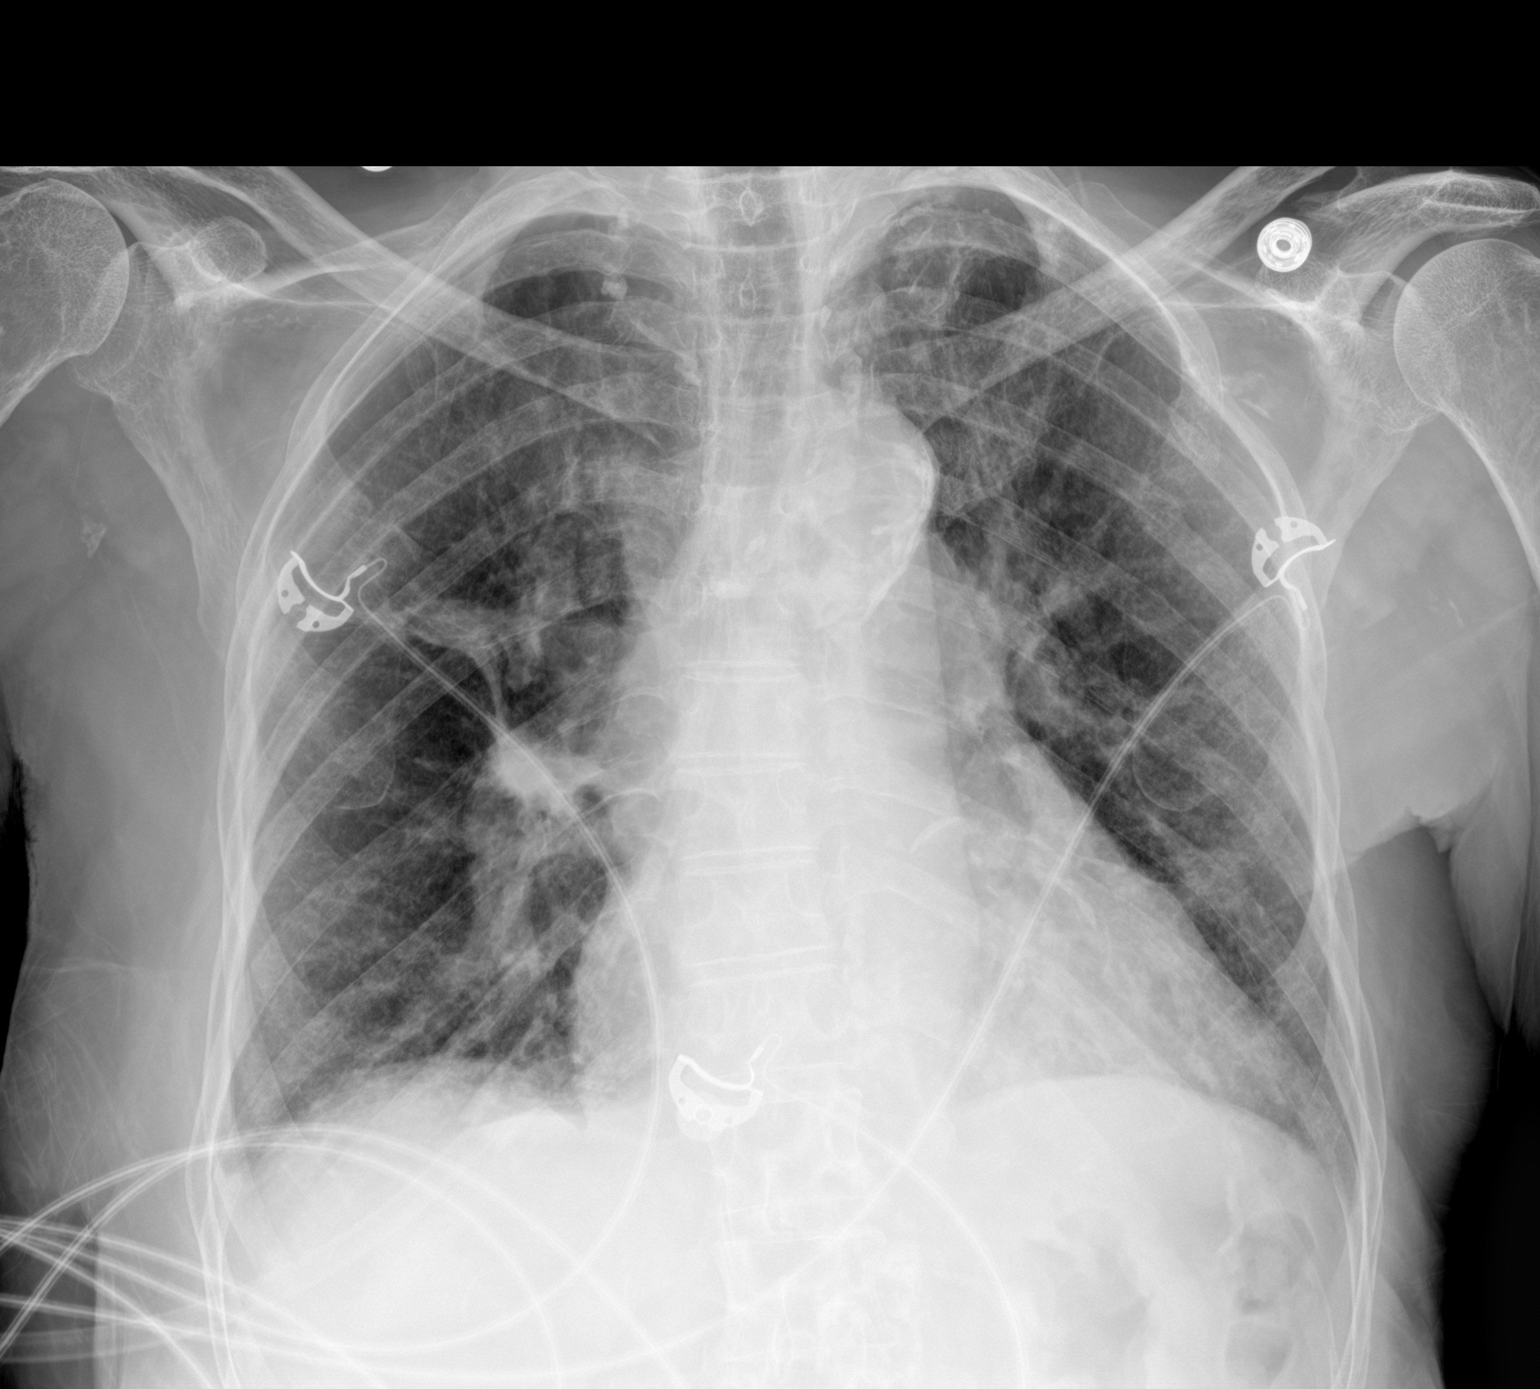

[1 of 1 positions shown; findings below may reference images not displayed]

FINDINGS: Significant decrease in right pleural fluid following thoracentesis.

No pneumothorax.

Elongated opacity in the right upper lobe is stable. Opacity in the
medial left upper lobe is stable. Prominent interstitial markings
bilaterally are also unchanged. No new lung abnormalities.
IMPRESSION: 1. Significant decrease in right pleural fluid following
thoracentesis. No pneumothorax. No other change.

## 2023-06-15 IMAGING — US US THORACENTESIS ASP PLEURAL SPACE W/IMG GUIDE
1 series · 2 of 2 positions shown · non-contrast
Comparison: none

INDICATION: History of COPD on chronic O2, CKD, AFib and lung cancer with
recurrent right pleural effusion. Request received for diagnostic
and therapeutic right thoracentesis.

[Series 1: us thoracentesis asp pleural space w/img guide · 2 of 2 slices shown]
[im 1/2]
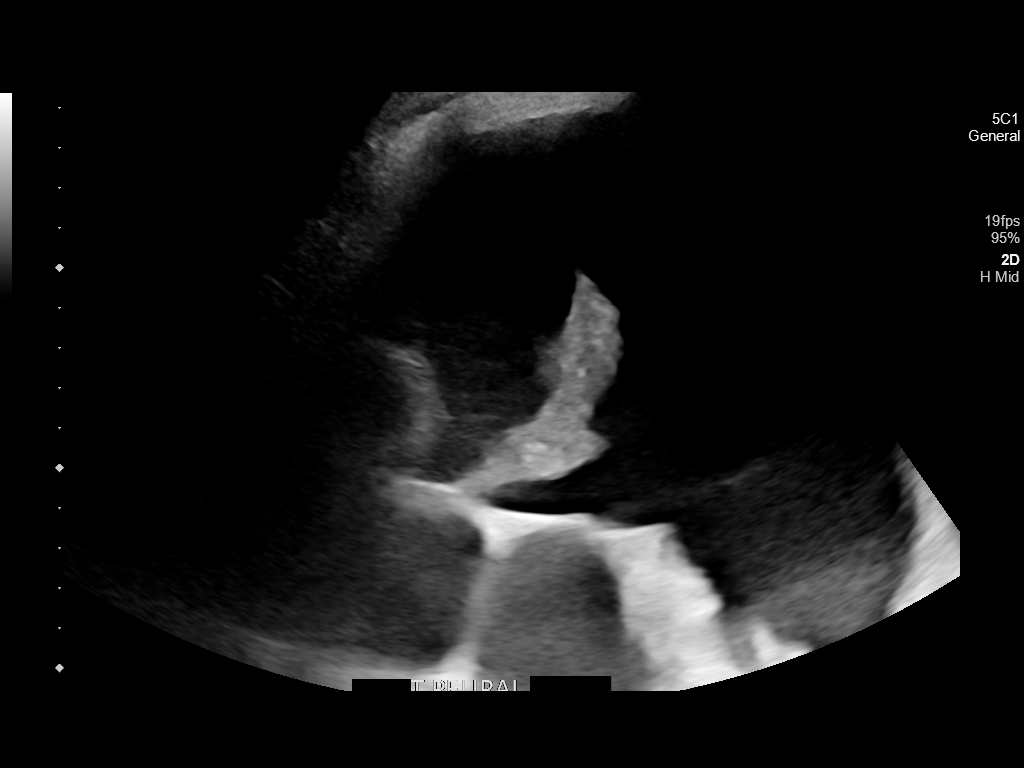
[im 2/2]
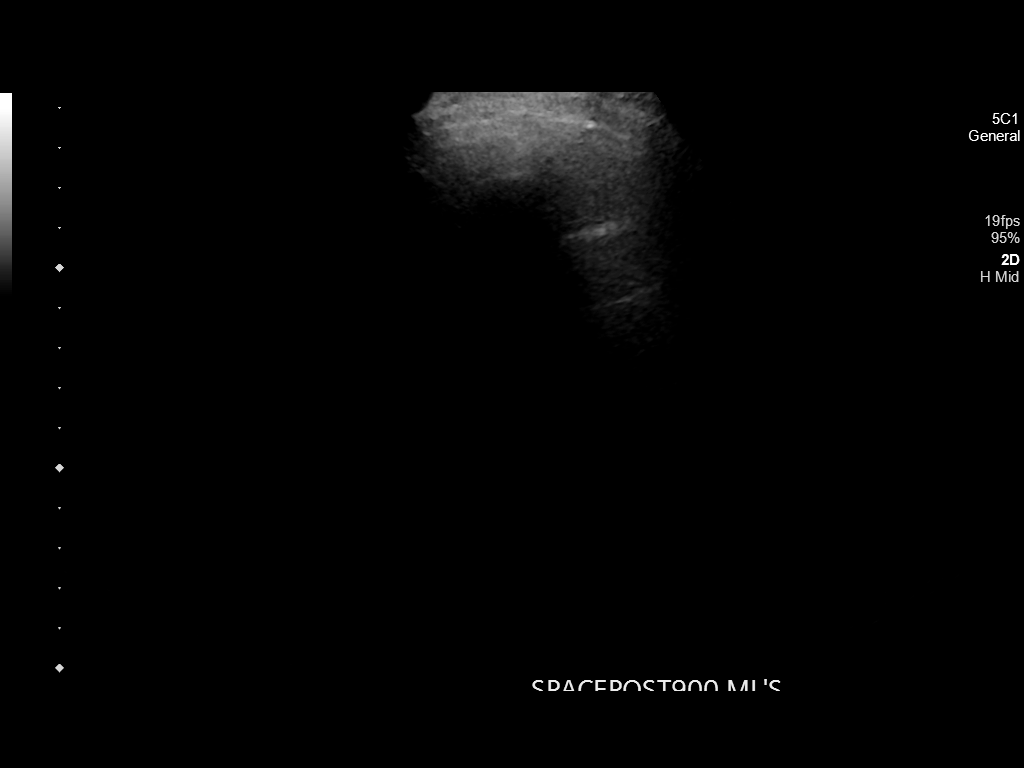

[2 of 2 positions shown; findings below may reference images not displayed]

EXAM:
ULTRASOUND GUIDED DIAGNOSTIC AND THERAPEUTIC RIGHT THORACENTESIS

MEDICATIONS:
10 mL 1 % lidocaine

COMPLICATIONS:
None immediate.

PROCEDURE:
An ultrasound guided thoracentesis was thoroughly discussed with the
patient and questions answered. The benefits, risks, alternatives
and complications were also discussed. The patient understands and
wishes to proceed with the procedure. Written consent was obtained.

Ultrasound was performed to localize and mark an adequate pocket of
fluid in the right chest. The area was then prepped and draped in
the normal sterile fashion. 1% Lidocaine was used for local
anesthesia. Under ultrasound guidance a 6 Fr Safe-T-Centesis
catheter was introduced. Thoracentesis was performed. The catheter
was removed and a dressing applied.
FINDINGS: A total of approximately 900 mL of hazy, amber fluid was removed.
Samples were sent to the laboratory as requested by the clinical
team.
IMPRESSION: Successful ultrasound guided right thoracentesis yielding 900 mL of
pleural fluid.

## 2023-06-16 IMAGING — CT CT HEAD CODE STROKE
4 series · 16 of 47 positions shown, 18 images · non-contrast
Comparison: 11/16/2020

CLINICAL DATA: Code stroke.  Neuro deficit, acute, stroke suspected



[Series 3: head bone · axial · 0.42mm/px · z∈[-65,-37]mm · 3 of 74 slices shown]
[im 8/74  bone]
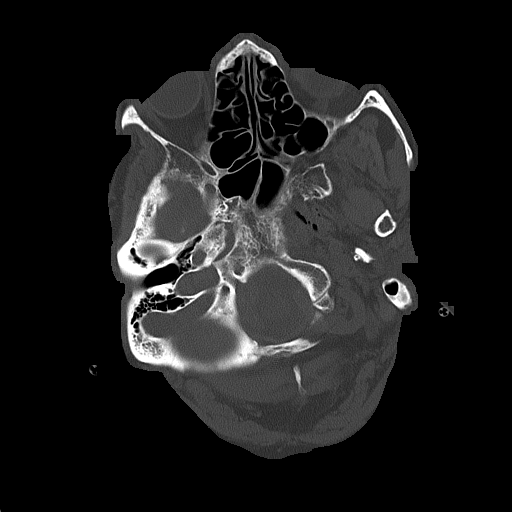
[im 15/74  bone]
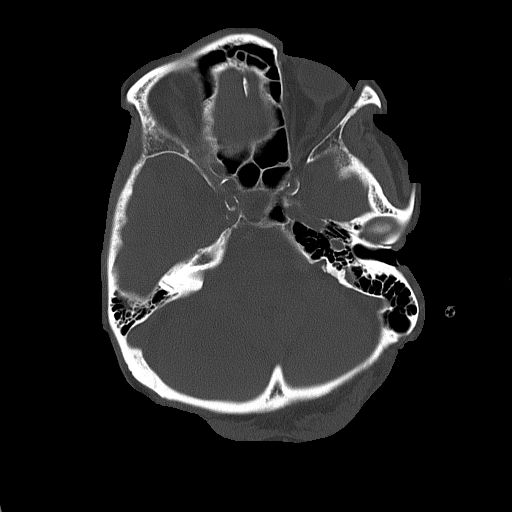
[im 22/74  bone]
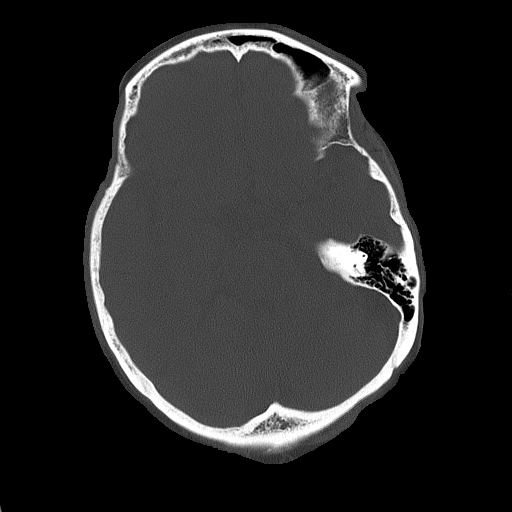

[Series 4: head wo · axial · 0.42mm/px · z∈[-64,+46]mm · 7 of 30 slices shown, 9 images]
[im 4/30  brain]
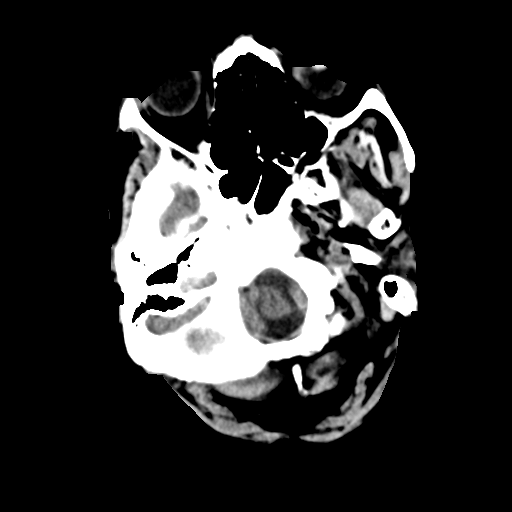
[im 4/30  bone]
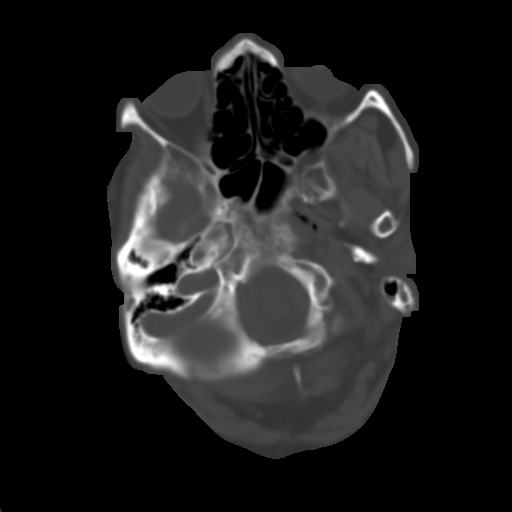
[im 8/30  brain]
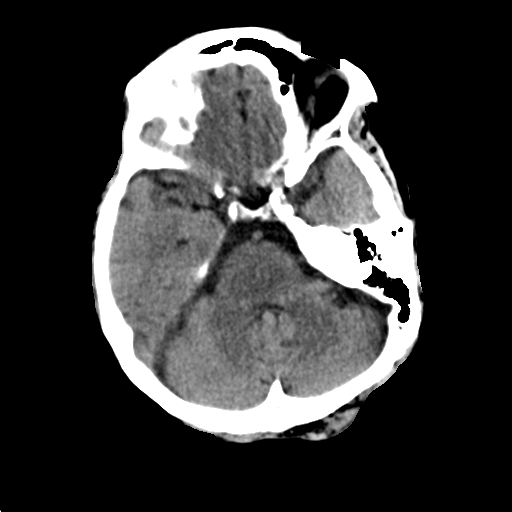
[im 11/30  brain]
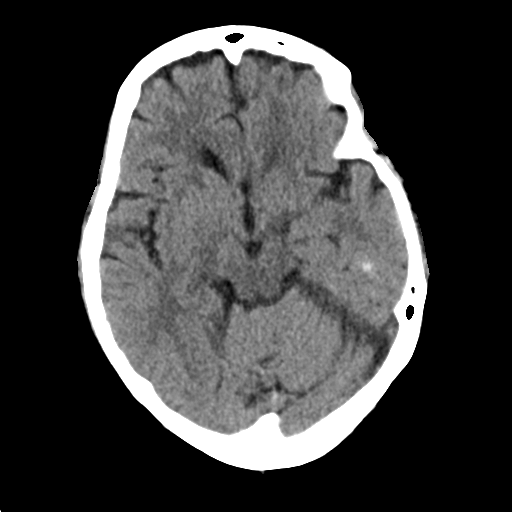
[im 15/30  brain]
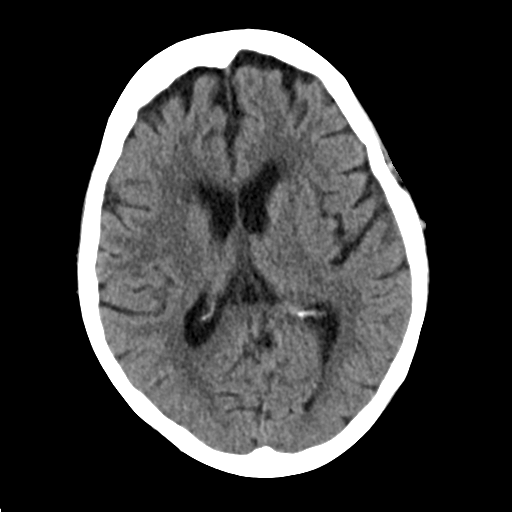
[im 19/30  brain]
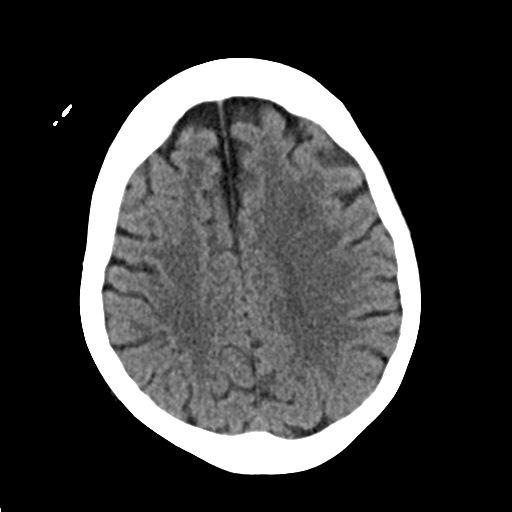
[im 19/30  bone]
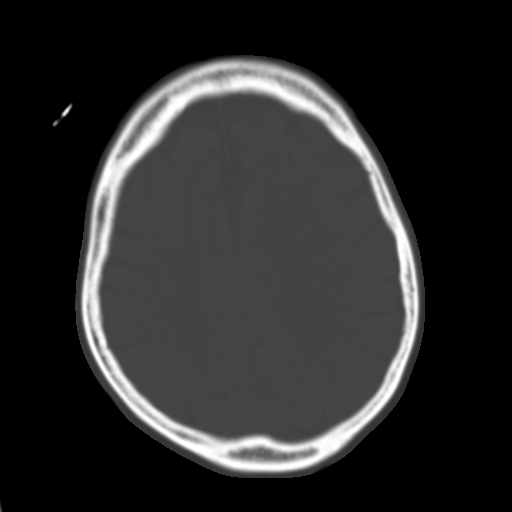
[im 22/30  brain]
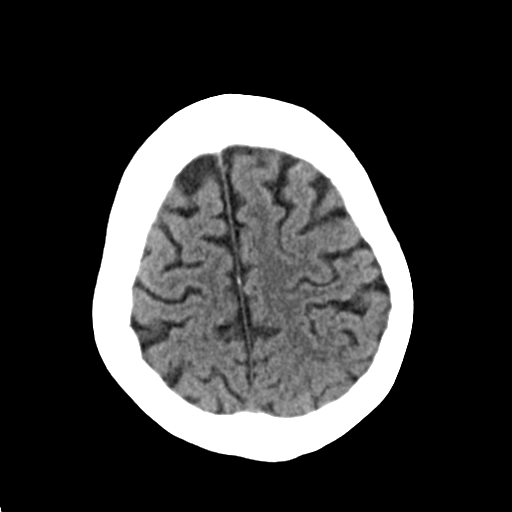
[im 26/30  brain]
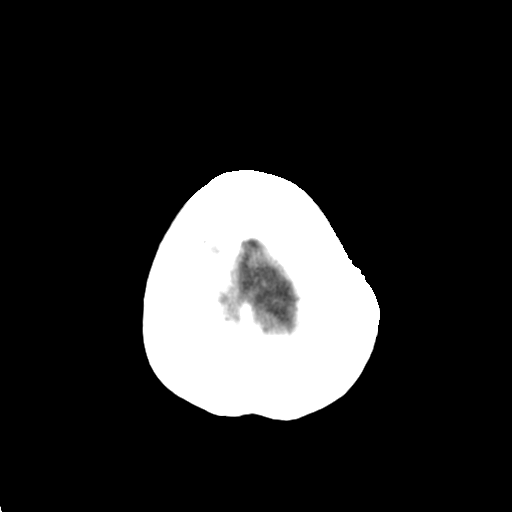

[Series 5: coronal soft tissue · coronal · 0.31mm/px · 3 of 70 slices shown]
[im 24/70  brain]
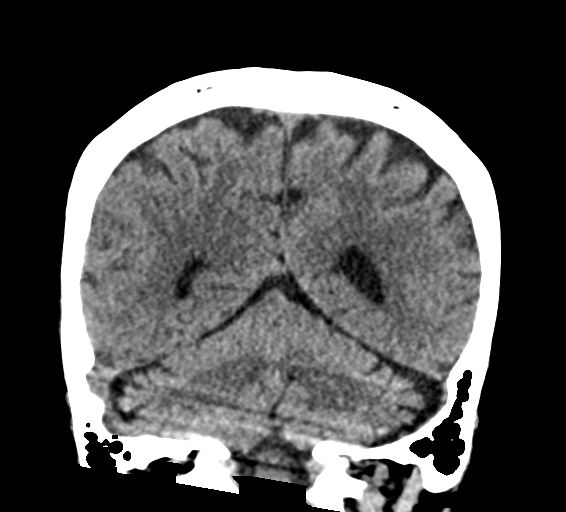
[im 31/70  brain]
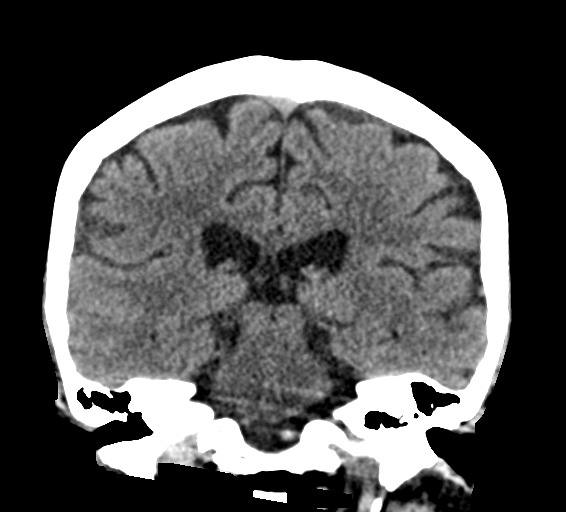
[im 39/70  brain]
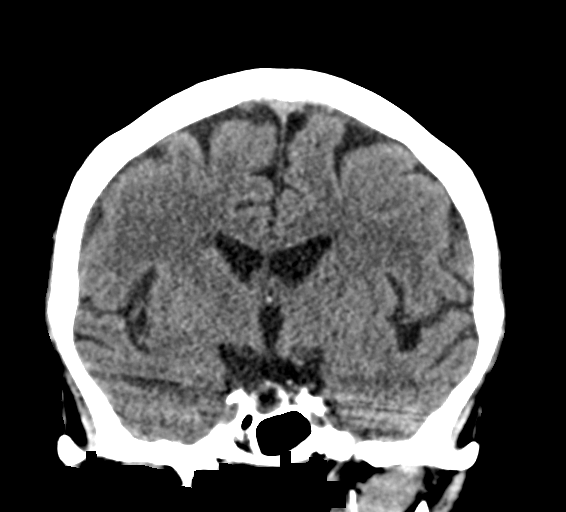

[Series 6: sagittal soft tissue · sagittal · 0.31mm/px · 3 of 51 slices shown]
[im 20/51  brain]
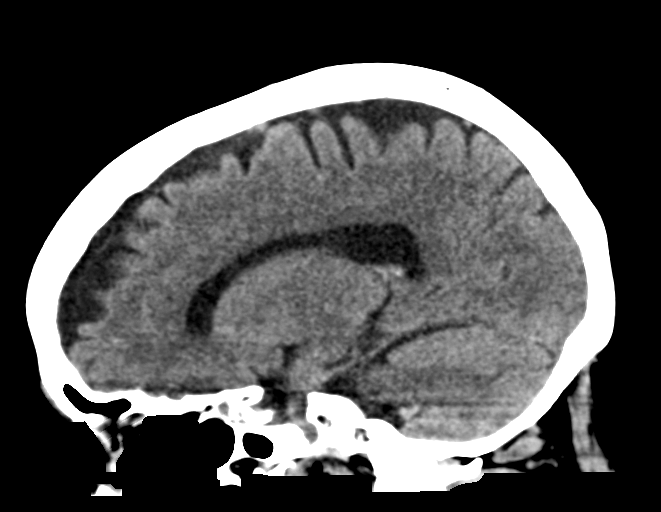
[im 26/51  brain]
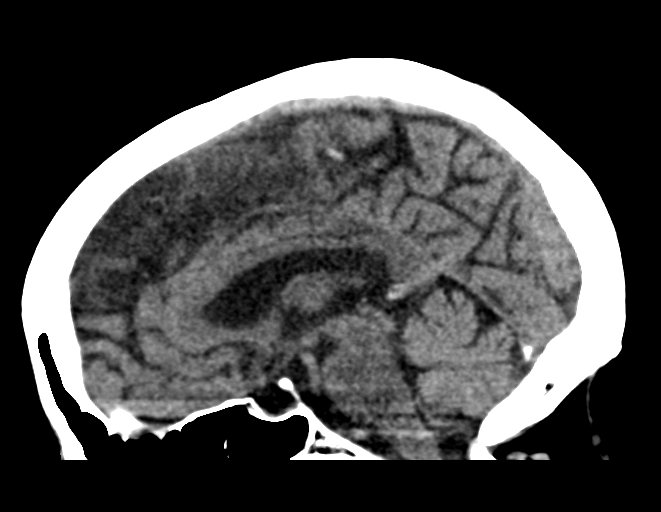
[im 32/51  brain]
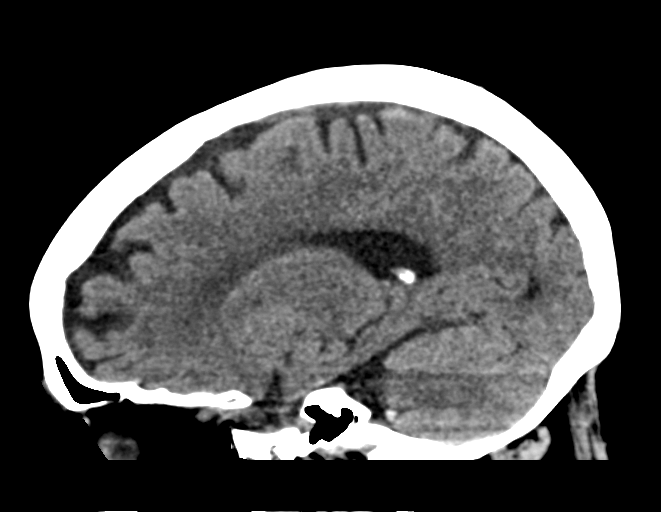

[16 of 47 positions shown; findings below may reference images not displayed]

FINDINGS: Brain: There is no acute intracranial hemorrhage, mass effect, or
edema. Gray-white differentiation is preserved. Patchy
hypoattenuation in the supratentorial white matter is nonspecific
probably reflects stable mild chronic microvascular ischemic
changes. Ventricles and sulci are within normal limits in size and
configuration. No extra-axial collection.

Vascular: No hyperdense vessel. There is intracranial
atherosclerotic calcification at the skull base.

Skull: Unremarkable.

Sinuses/Orbits: No acute finding.

Other: Mastoid air cells are clear.

ASPECTS (Alberta Stroke Program Early CT Score)

- Ganglionic level infarction (caudate, lentiform nuclei, internal
capsule, insula, M1-M3 cortex): 7

- Supraganglionic infarction (M4-M6 cortex): 3

Total score (0-10 with 10 being normal): 10
IMPRESSION: There is no acute intracranial hemorrhage or evidence of acute
infarction. ASPECT score is 10.

## 2023-06-17 IMAGING — US US CAROTID DUPLEX BILAT
1 series · 13 of 24 positions shown · non-contrast
Comparison: None.

CLINICAL DATA: 65-year-old female with a history of stroke

EXAM:
BILATERAL CAROTID DUPLEX ULTRASOUND
TECHNIQUE: Gray scale imaging, color Doppler and duplex ultrasound were
performed of bilateral carotid and vertebral arteries in the neck.

[Series 1: us carotid bilateral · 13 of 79 slices shown]
[im 1/79]
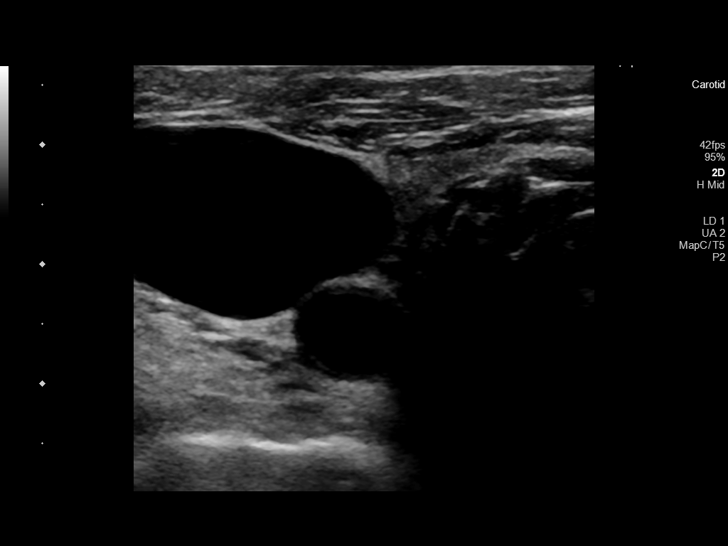
[im 7/79]
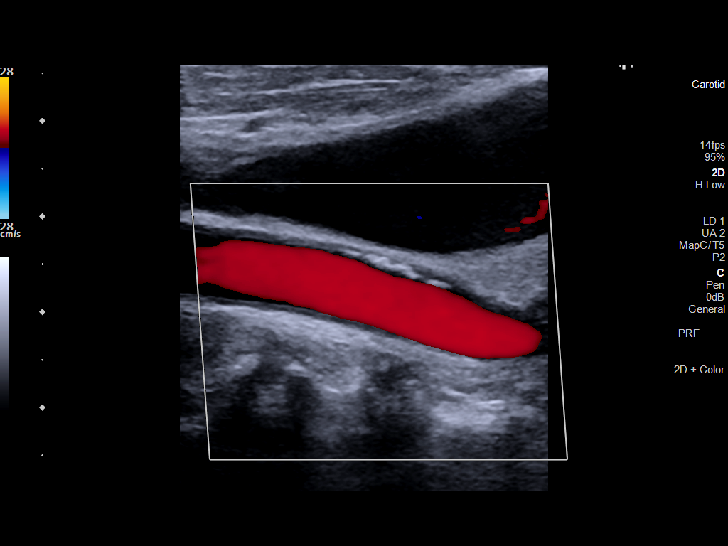
[im 14/79]
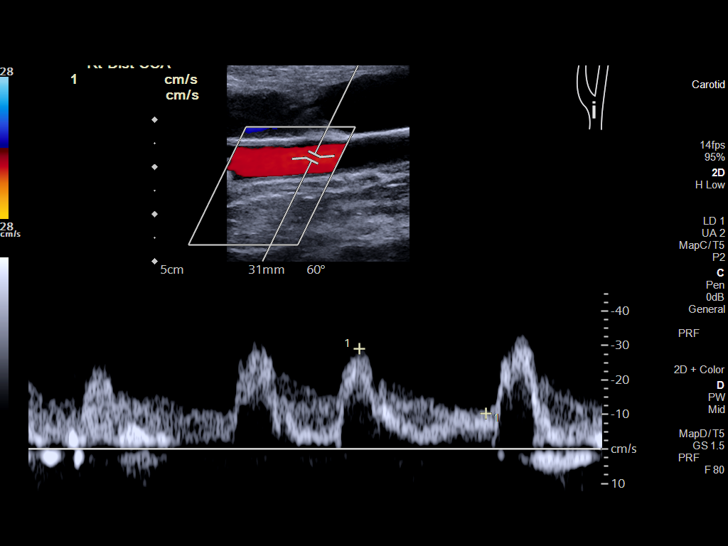
[im 21/79]
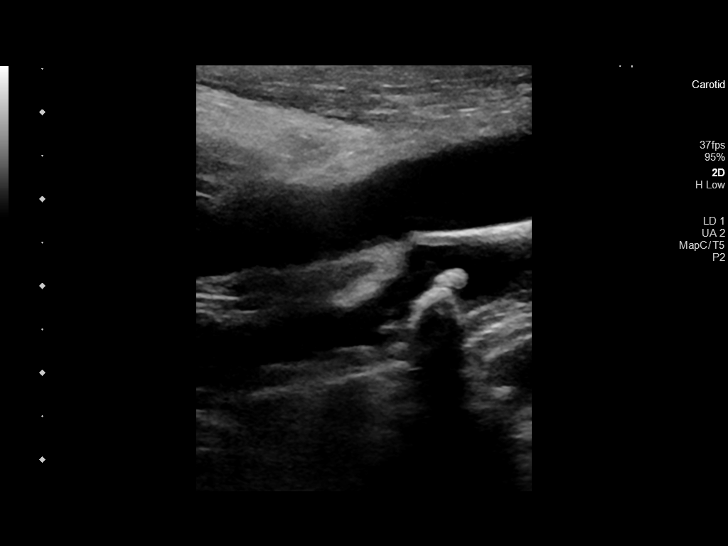
[im 28/79]
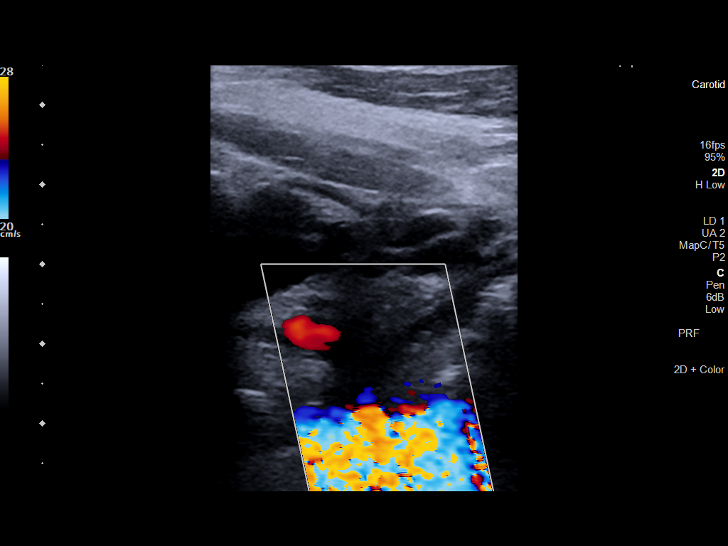
[im 34/79]
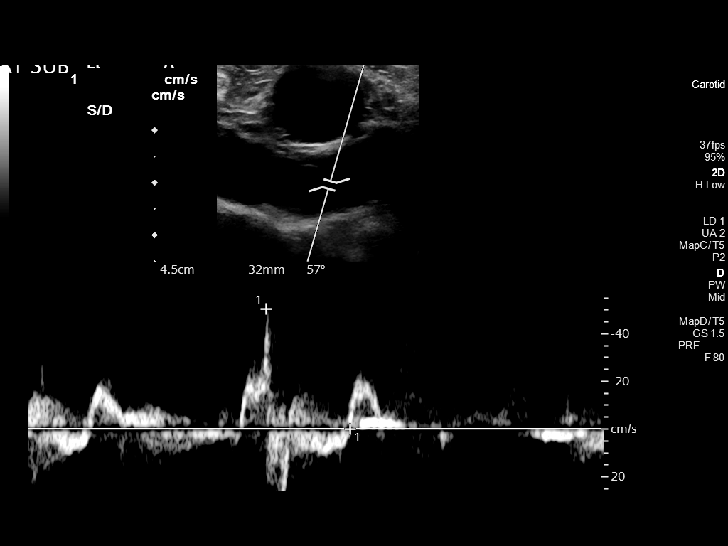
[im 41/79]
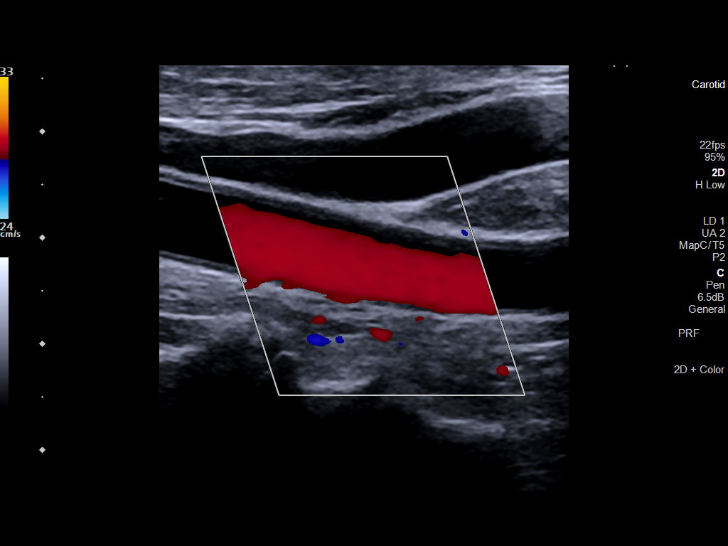
[im 45/79]
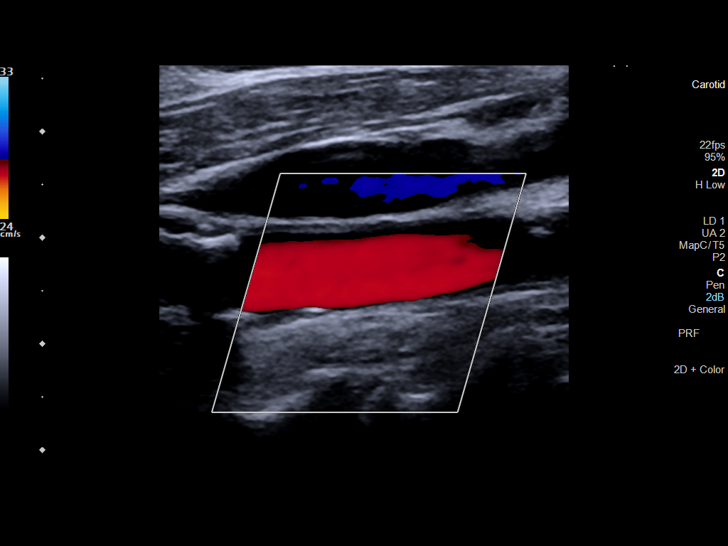
[im 51/79]
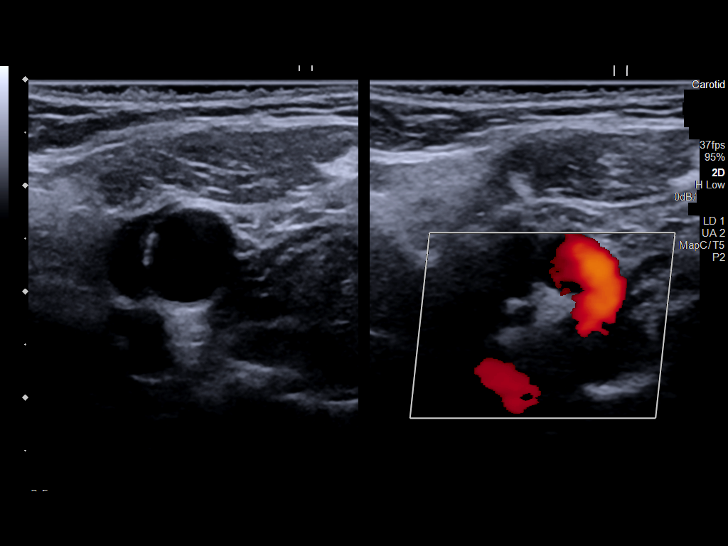
[im 58/79]
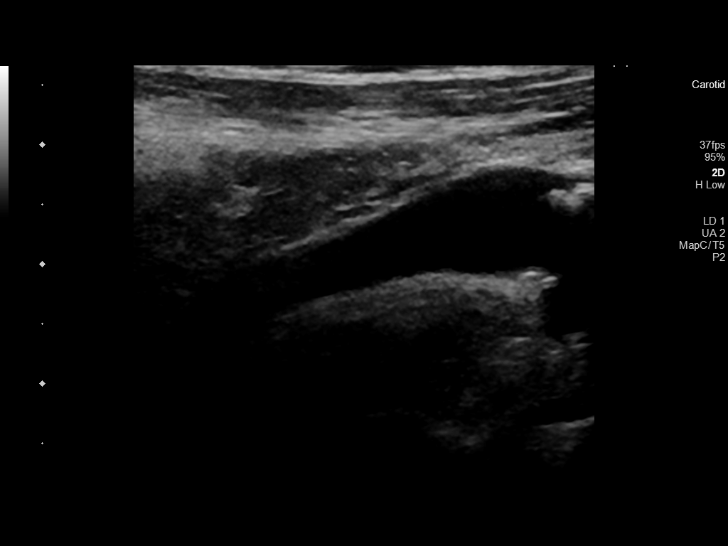
[im 65/79]
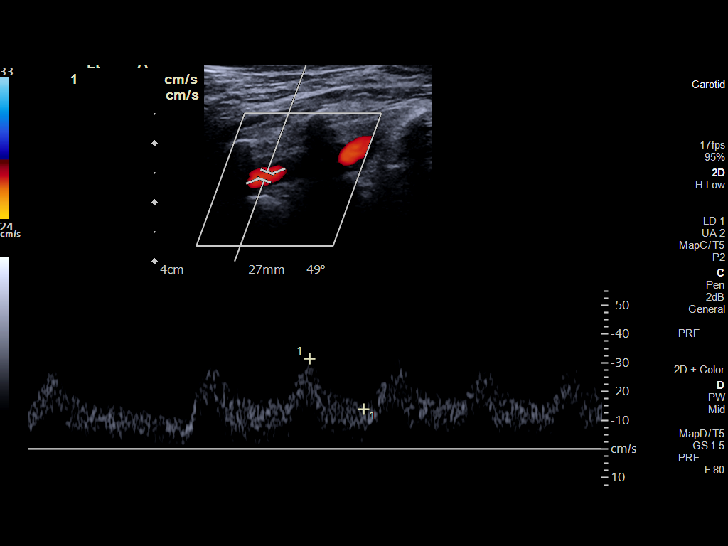
[im 72/79]
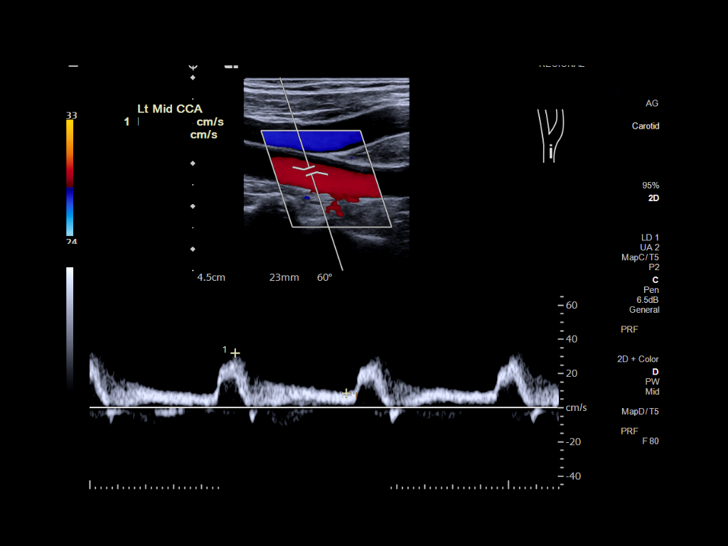
[im 79/79]
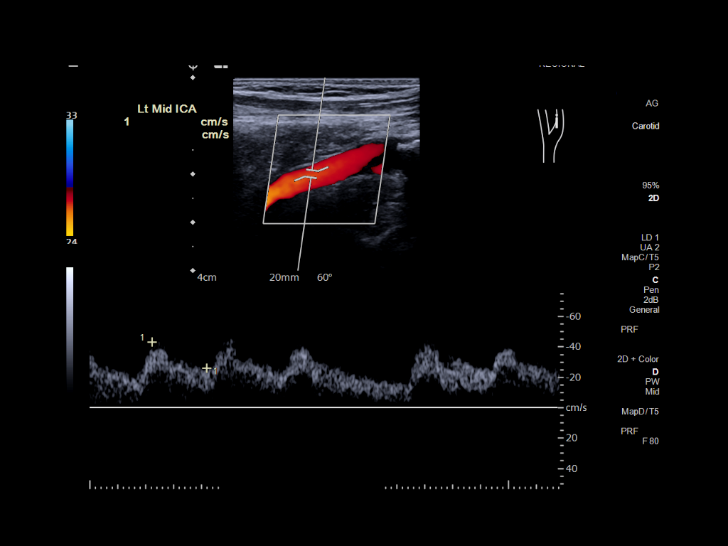

[13 of 24 positions shown; findings below may reference images not displayed]

FINDINGS: Criteria: Quantification of carotid stenosis is based on velocity
parameters that correlate the residual internal carotid diameter
with NASCET-based stenosis levels, using the diameter of the distal
internal carotid lumen as the denominator for stenosis measurement.

The following velocity measurements were obtained:

RIGHT

ICA:  Systolic 77 cm/sec, Diastolic 30 cm/sec

CCA:  29 cm/sec

SYSTOLIC ICA/CCA RATIO:

ECA:  27 cm/sec

LEFT

ICA:  Systolic 56 cm/sec, Diastolic 23 cm/sec

CCA:  29 cm/sec

SYSTOLIC ICA/CCA RATIO:

ECA:  28 cm/sec

Right Brachial SBP: Not acquired

Left Brachial SBP: Not acquired

RIGHT CAROTID ARTERY: No significant calcifications of the right
common carotid artery. Intermediate waveform maintained. Moderate
heterogeneous and partially calcified plaque at the right carotid
bifurcation. No significant lumen shadowing. Low resistance waveform
of the right ICA. No significant tortuosity.

RIGHT VERTEBRAL ARTERY: Antegrade flow with low resistance waveform.

LEFT CAROTID ARTERY: No significant calcifications of the left
common carotid artery. Intermediate waveform maintained. Moderate
heterogeneous and partially calcified plaque at the left carotid
bifurcation. No significant lumen shadowing. Low resistance waveform
of the left ICA. No significant tortuosity.

LEFT VERTEBRAL ARTERY:  Antegrade flow with low resistance waveform.
IMPRESSION: Color duplex indicates moderate heterogeneous and calcified plaque,
with no hemodynamically significant stenosis by duplex criteria in
the extracranial cerebrovascular circulation.

## 2023-06-19 IMAGING — CR DG CHEST 2V
2 series · 2 of 2 positions shown · non-contrast
Comparison: 01/28/2022

CLINICAL DATA: Shortness of breath.

EXAM:
CHEST - 2 VIEW

[chest lat]
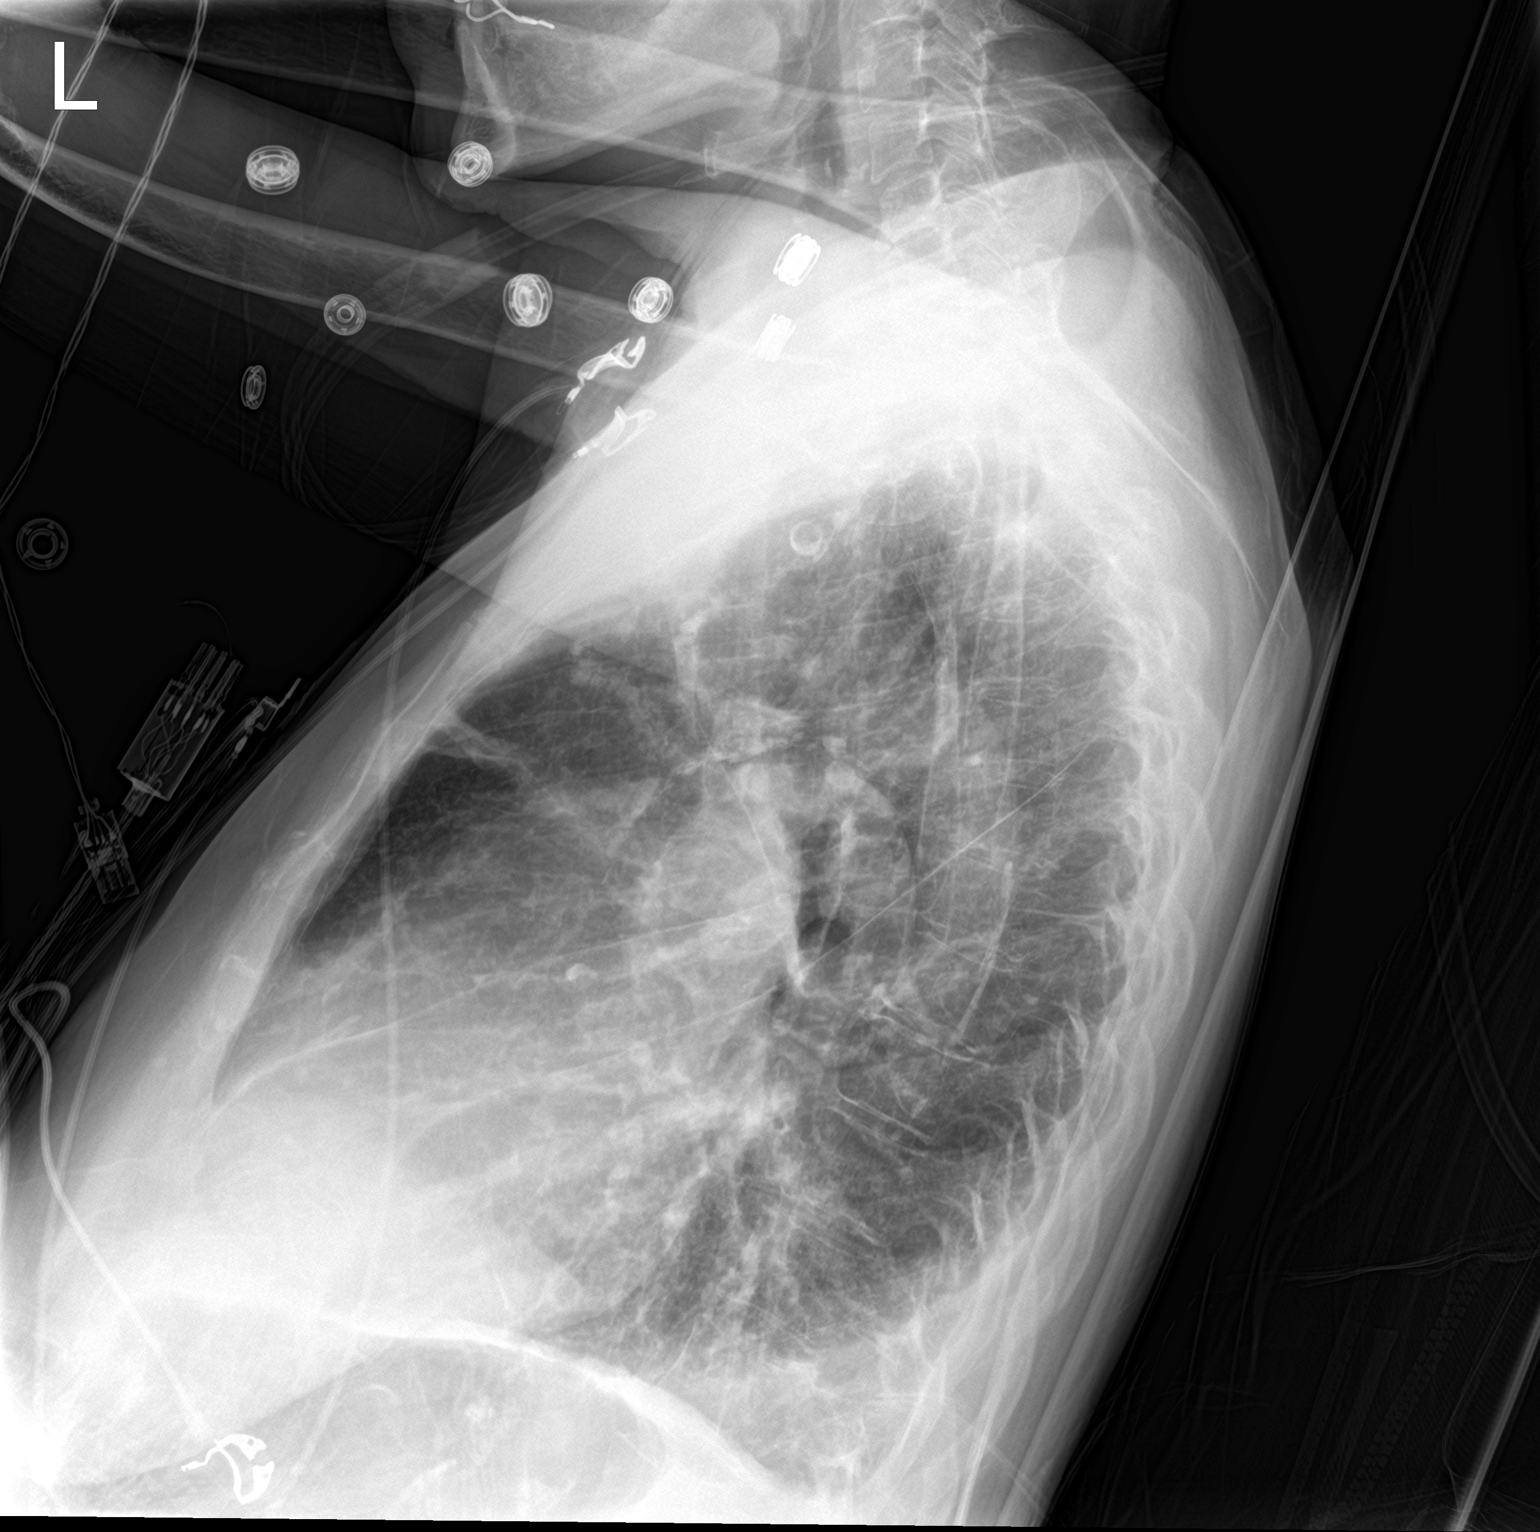

[chest ap]
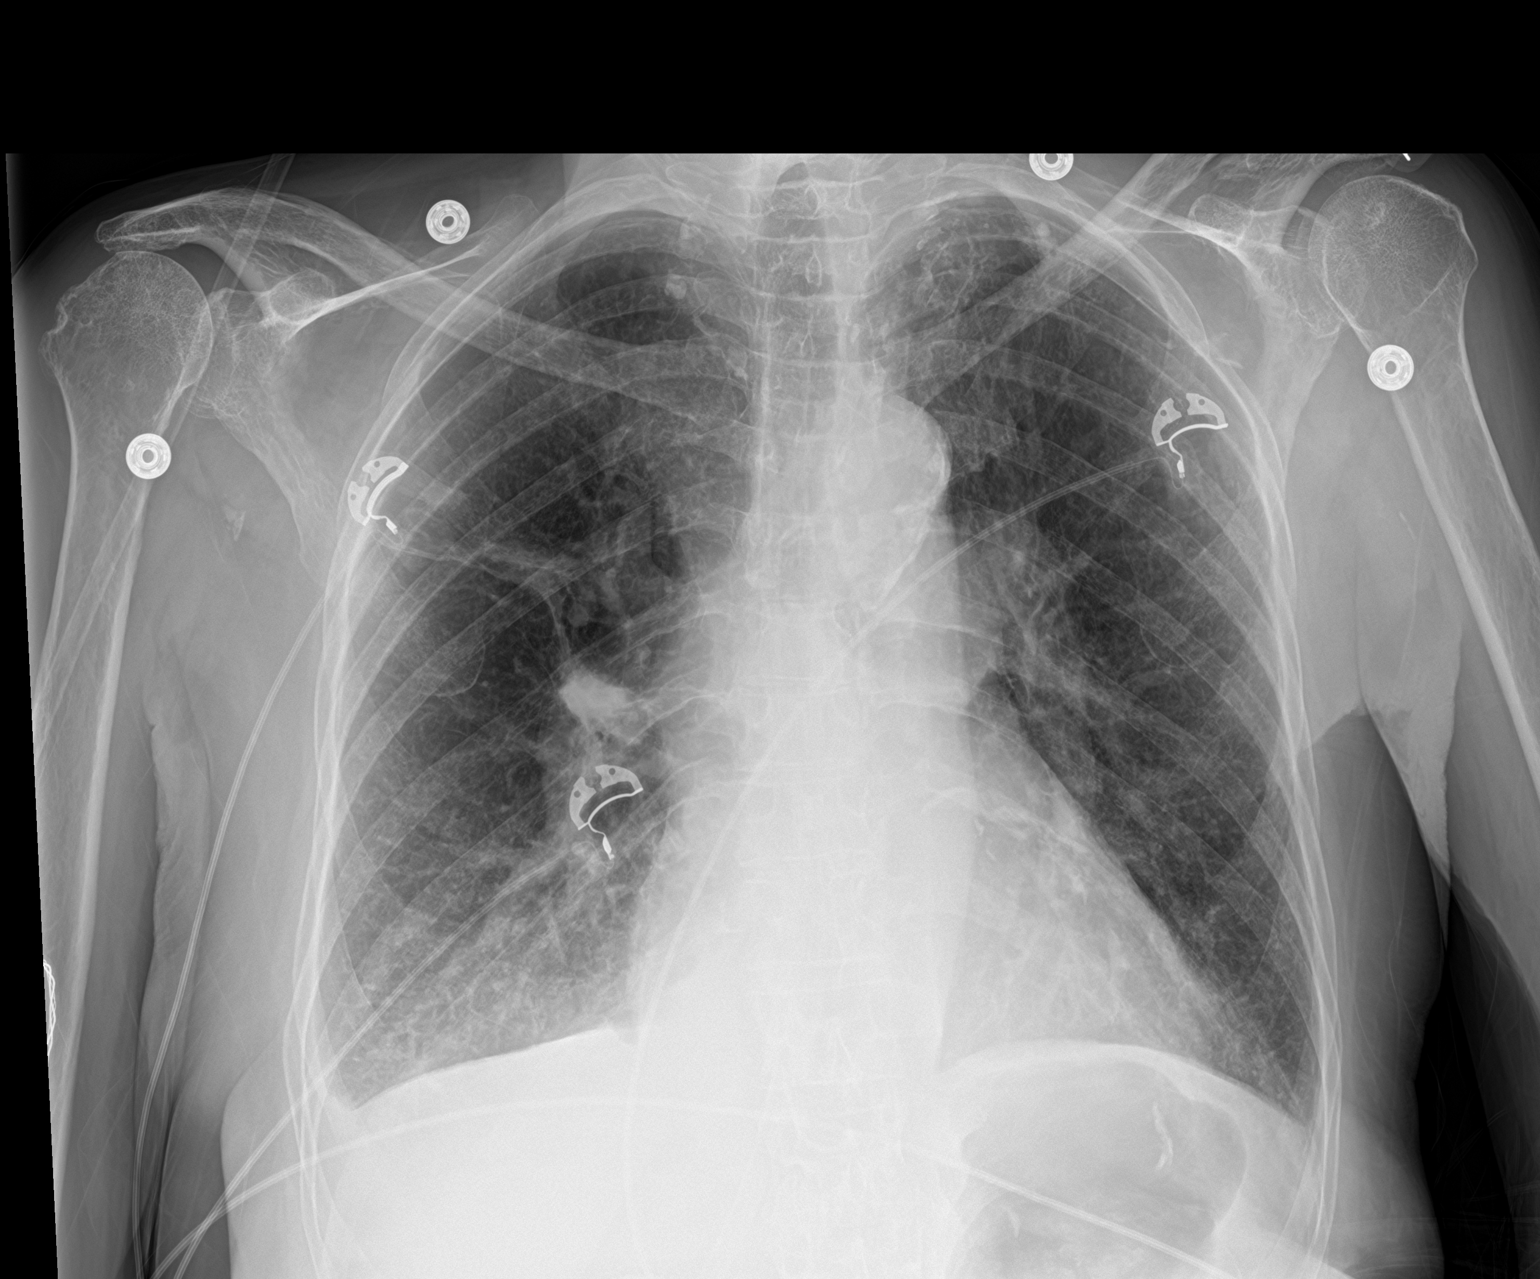

[2 of 2 positions shown; findings below may reference images not displayed]

FINDINGS: Stable mild cardiomegaly. Aortic atherosclerotic calcification
noted.

Right upper lobe scarring remains stable. Bibasilar interstitial
prominence and Kerley B-lines are increased, consistent with mild
interstitial edema. Small right pleural effusion is also noted. No
evidence of pulmonary consolidation.
IMPRESSION: Mild congestive heart failure, with small right pleural effusion.

## 2023-07-09 IMAGING — CT CT CHEST W/O CM
2 of 4 series · 14 of 36 positions shown, 17 images · non-contrast
Comparison: Radiograph earlier today. Chest CT 01/26/2022,
additional prior chest CT reviewed

CLINICAL DATA: Chest wall pain, nontraumatic, infection or
inflammation suspected, xray done Patient with history of multiple
cancers. Patient has increasing pain right chest



[Series 3: chest wo · axial · 0.59mm/px · z∈[+1228,+1492]mm · 11 of 157 slices shown, 14 images]
[im 13/157  mediastinal]
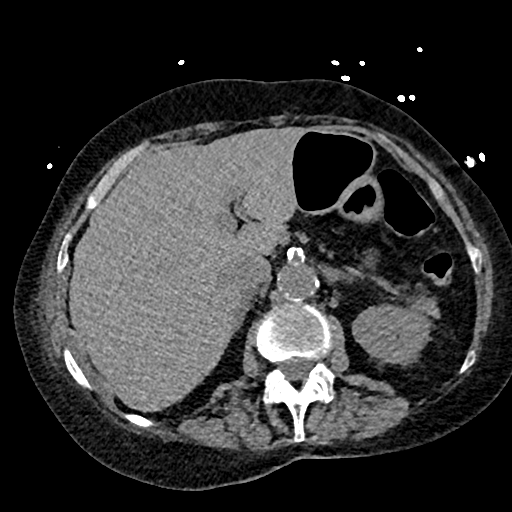
[im 13/157  lung]
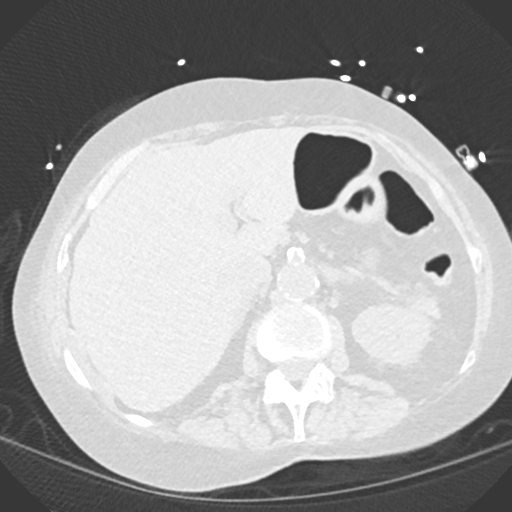
[im 25/157  lung]
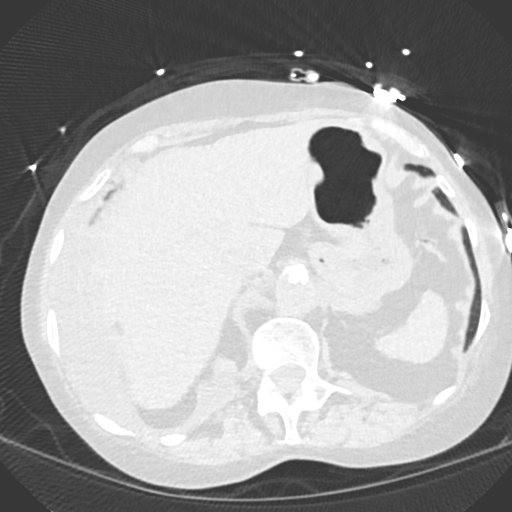
[im 37/157  lung]
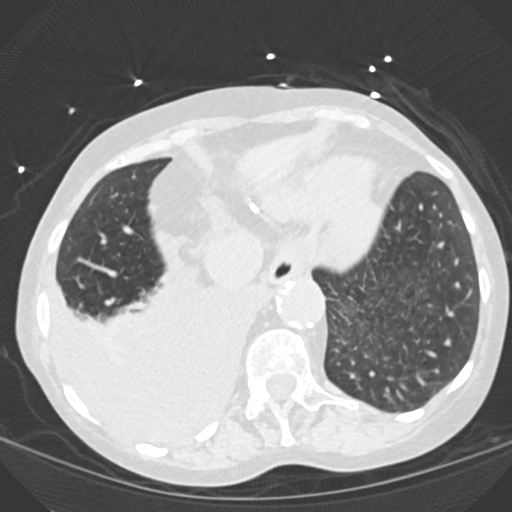
[im 49/157  lung]
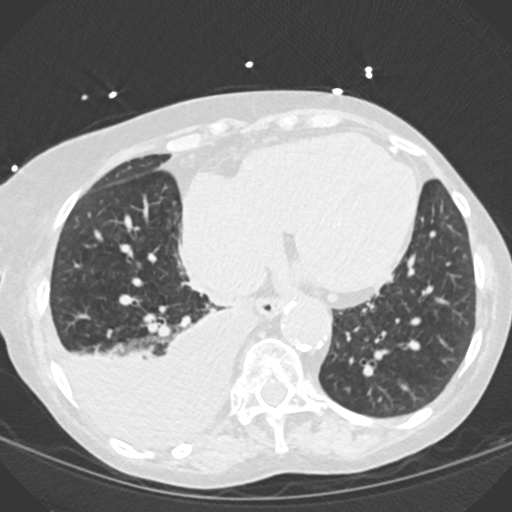
[im 61/157  mediastinal]
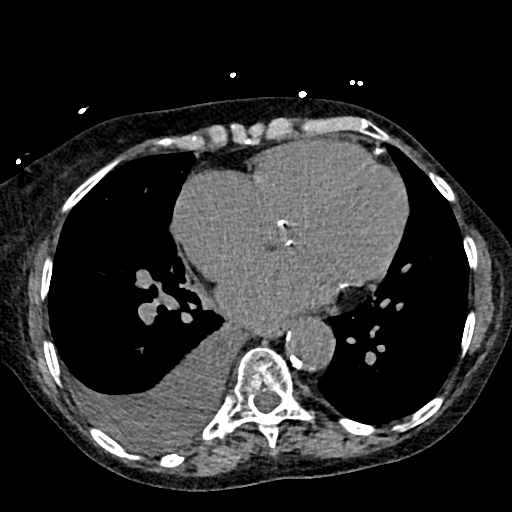
[im 61/157  lung]
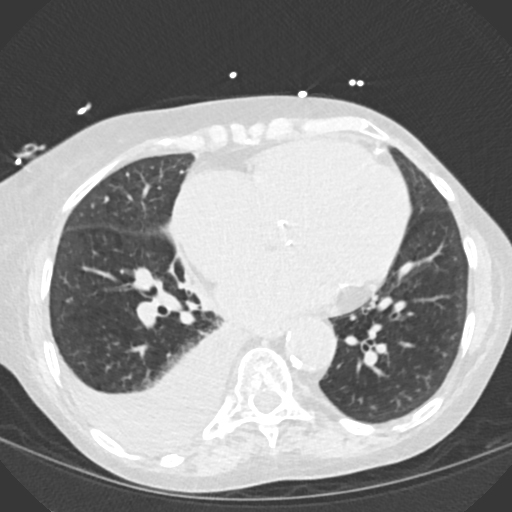
[im 85/157  lung]
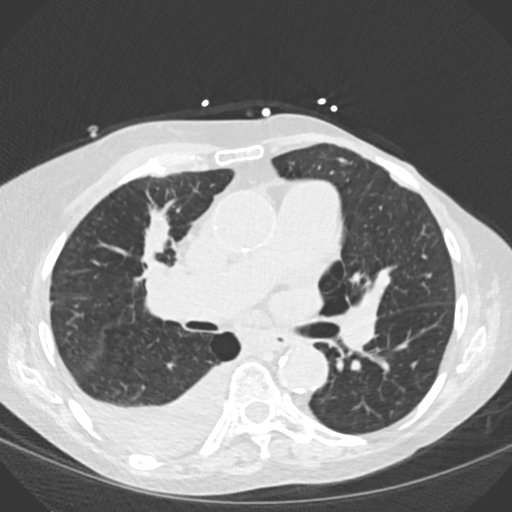
[im 97/157  lung]
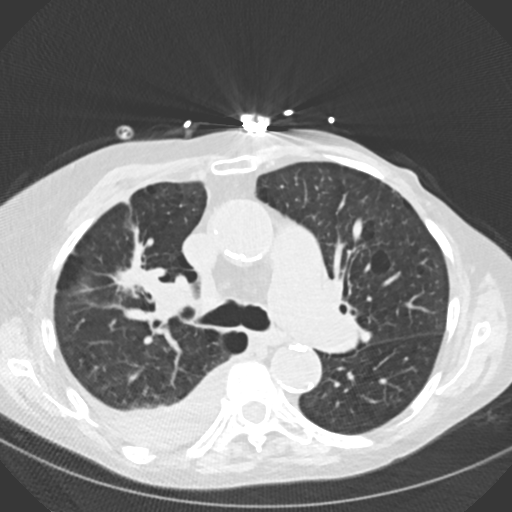
[im 109/157  lung]
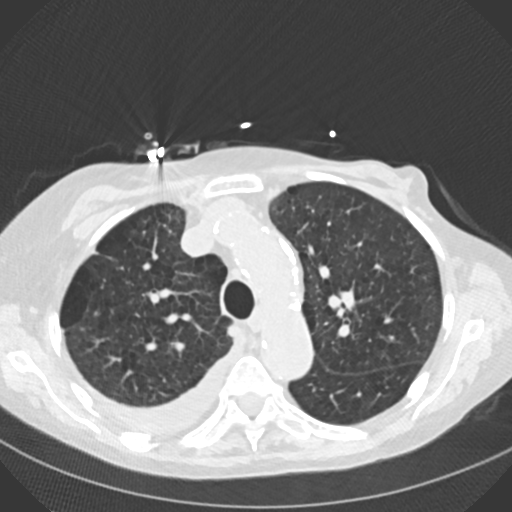
[im 121/157  mediastinal]
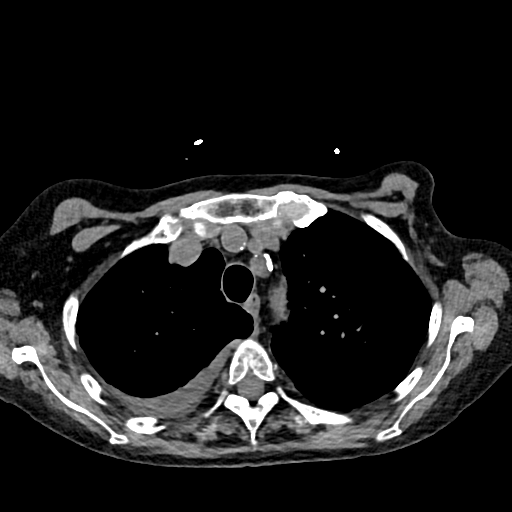
[im 121/157  lung]
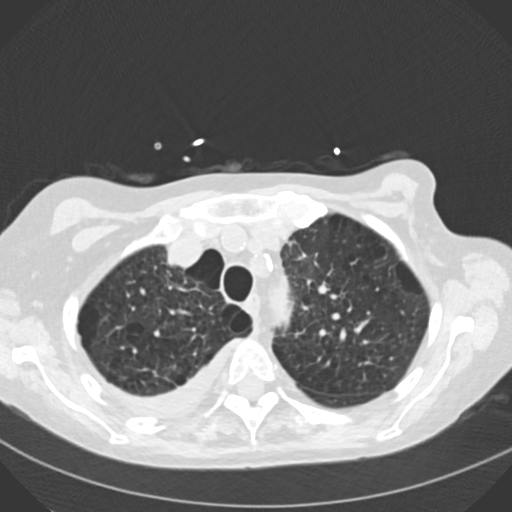
[im 133/157  lung]
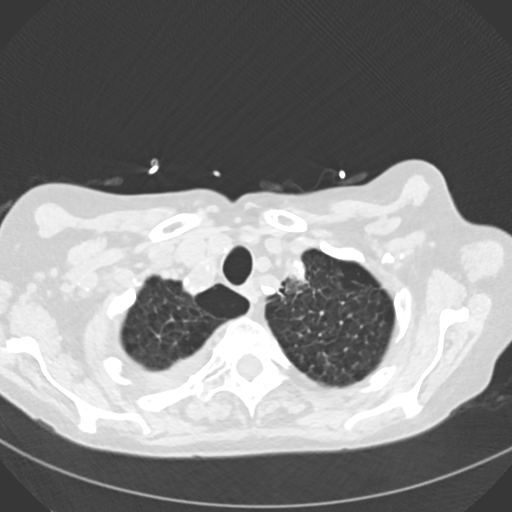
[im 145/157  lung]
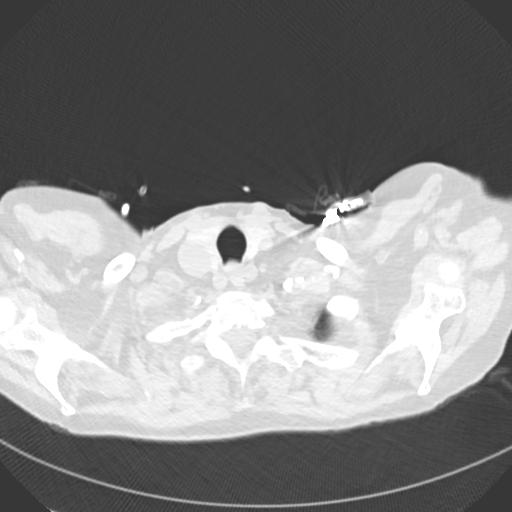

[Series 6: cor · coronal · 0.67mm/px · 3 of 121 slices shown]
[im 25/121  lung]
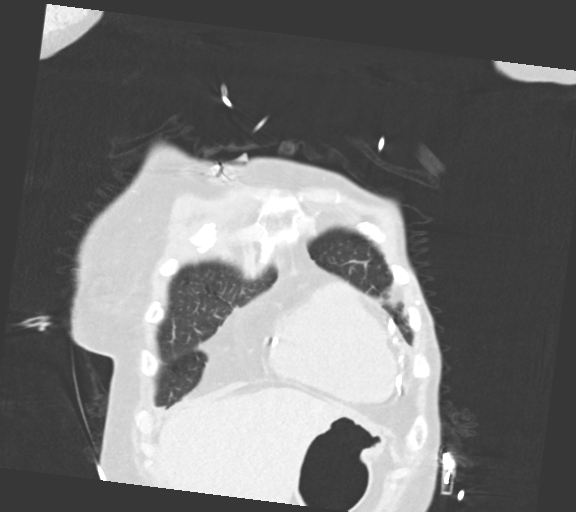
[im 49/121  lung]
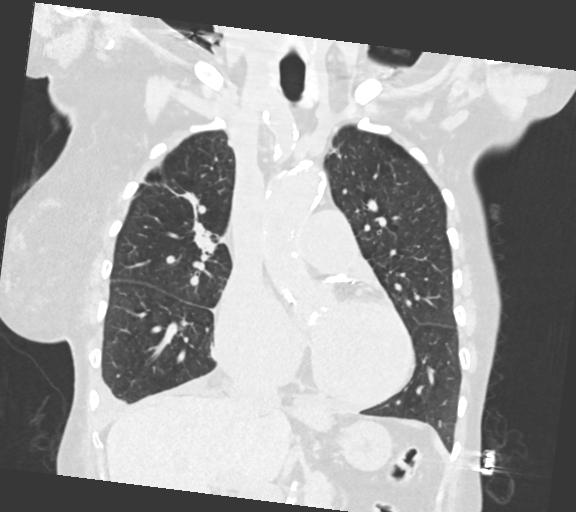
[im 73/121  lung]
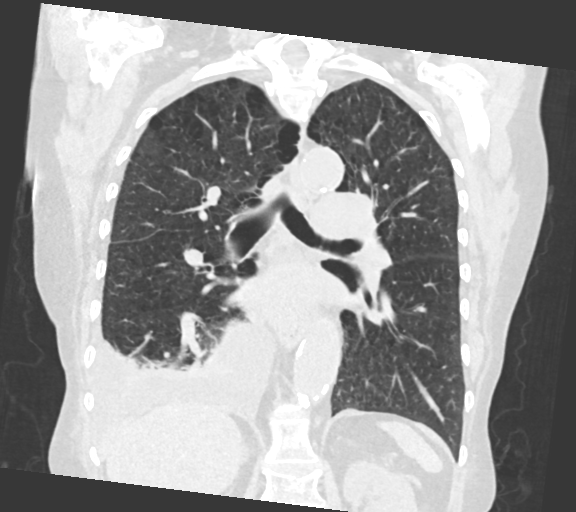

[14 of 36 positions shown; findings below may reference images not displayed]

FINDINGS: Cardiovascular: Moderately advanced aortic atherosclerosis. No
aortic aneurysm. Mild cardiomegaly. There are coronary artery
calcifications. Mitral annulus calcifications. Dilated central
pulmonary arteries, main pulmonary artery measures 3.6 cm, right
pulmonary artery measures 3.1 cm, left pulmonary artery measures
cm. There is no pericardial effusion.

Mediastinum/Nodes: Decreased subcarinal node currently measuring 14
mm, previously 16 mm, series 3, image 70. Additional scattered small
mediastinal lymph nodes are not enlarged by size criteria. Lack of
IV contrast significantly limits assessment for hilar adenopathy.
Small hiatal hernia with patulous esophagus.

Lungs/Pleura: Moderate emphysema. Layering right pleural effusion
with associated atelectasis, not significantly changed in appearance
from prior CT. Irregular nodular opacity in the right upper lobe
measuring 2.4 x 1.5 cm, series 4, image 59, not significantly
changed on my retrospective measurement. Masslike opacity in the
anterior right upper lobe measures 3.7 x 2 cm, series 4, image 69,
possibly smaller versus stable allowing for differences in caliper
placement in slice selection. Calcified left apical
pleuroparenchymal thickening is stable in related scarring. There is
no acute airspace disease or new pulmonary nodule. No left pleural
effusion. Stable areas of subpleural thickening in the anterior left
lung.

Upper Abdomen: Stable 11 mm water density lesion in the left lobe of
the liver, not metabolic in prior PET and consistent with cyst.
Dense upper abdominal aortic atherosclerosis. No acute upper
abdominal findings.

Musculoskeletal: Stable concavity of L2 superior endplate. No focal
rib lesion or rib fracture to explain right-sided pain. There are
remote left anterior rib fractures. Left mastectomy. Chest wall soft
tissue abnormalities.
IMPRESSION: 1. Layering right pleural effusion with associated atelectasis, not
significantly changed in appearance from CT last month.
2. Two areas of masslike opacity in the right upper lobe. 1 of these
areas appears stable, the other may be slightly smaller or
unchanged, regardless there is no progression. These may represent
areas of post radiation scarring. Continued oncologic follow-up is
recommended.
3. No new or acute airspace disease.
4. Decreased size of subcarinal node from prior exam.
5. Cardiomegaly with aortic atherosclerosis. Coronary artery
calcifications.

Aortic Atherosclerosis (DIPTL-4BC.C) and Emphysema ([id number].8).

## 2023-07-09 IMAGING — DX DG CHEST 1V PORT
1 series · 1 of 1 positions shown · non-contrast
Comparison: 02/01/2022

CLINICAL DATA: Short of breath

EXAM:
PORTABLE CHEST 1 VIEW

[chest ap]
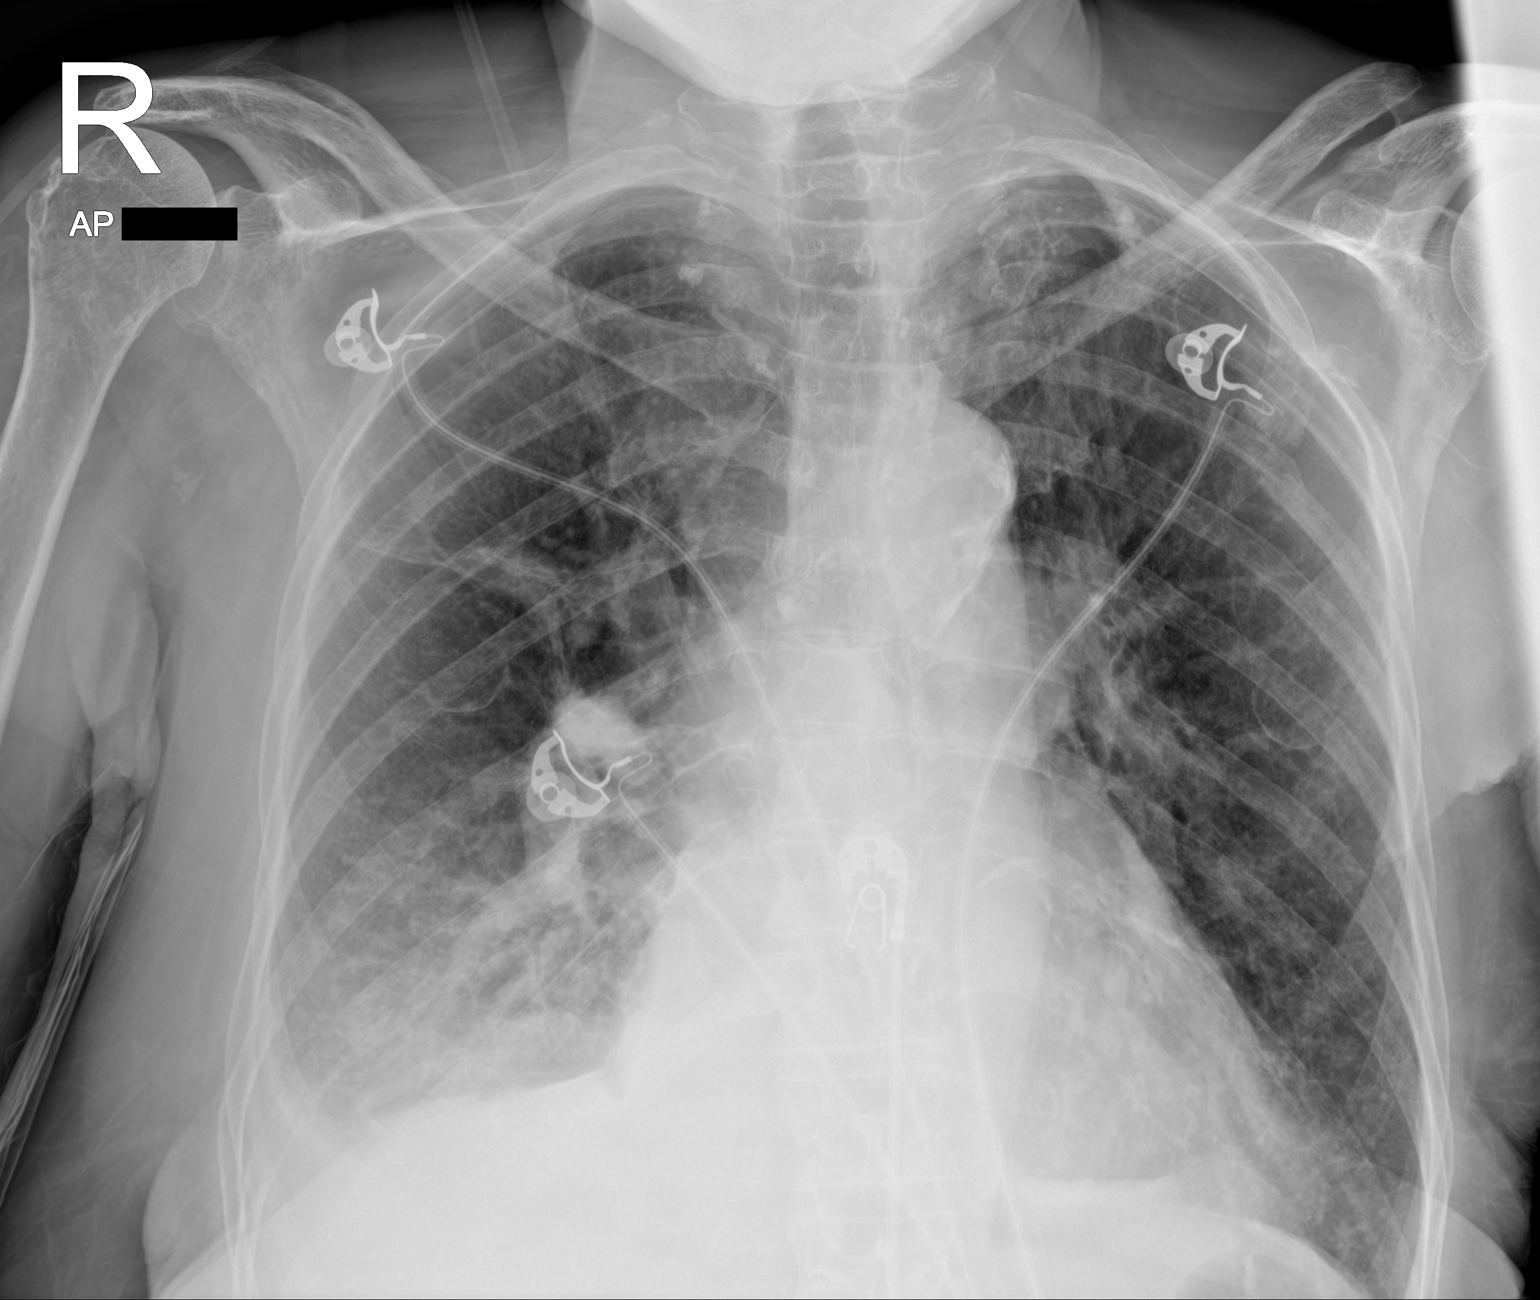

[1 of 1 positions shown; findings below may reference images not displayed]

FINDINGS: Progression of right lower lobe airspace disease and small right
effusion. Right perihilar density with mild progression.

Heart size upper normal. Negative for heart failure or edema.
Atherosclerotic calcification aortic arch. Left lung clear.
IMPRESSION: Progressive right lower lobe airspace disease and right pleural
effusion. Progressive density overlying the right hilum. Possible
pneumonia.

## 2023-07-11 IMAGING — US US THORACENTESIS ASP PLEURAL SPACE W/IMG GUIDE
1 series · 3 of 3 positions shown · non-contrast
Comparison: none

INDICATION: Patient with a history of chronic kidney disease, atrial
fibrillation and lung cancer with recurrent right pleural effusion.
Interventional radiology asked to perform a therapeutic
thoracentesis.

[Series 1: us thoracentesis asp pleural space w/img guide · 3 of 3 slices shown]
[im 1/3]
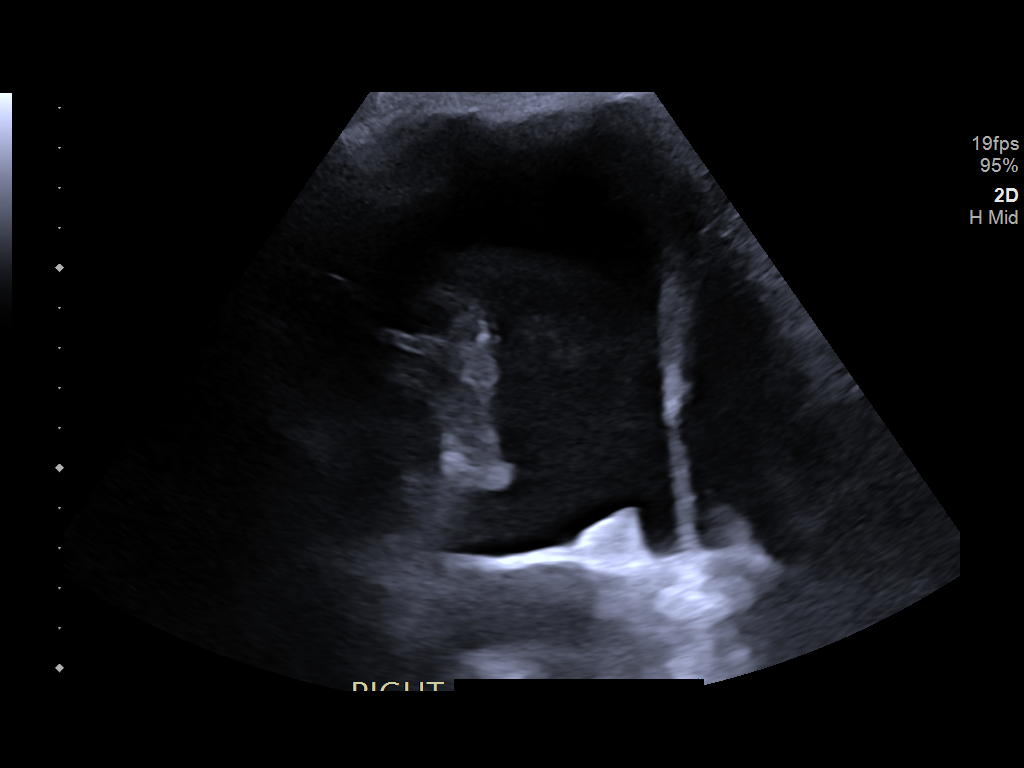
[im 2/3]
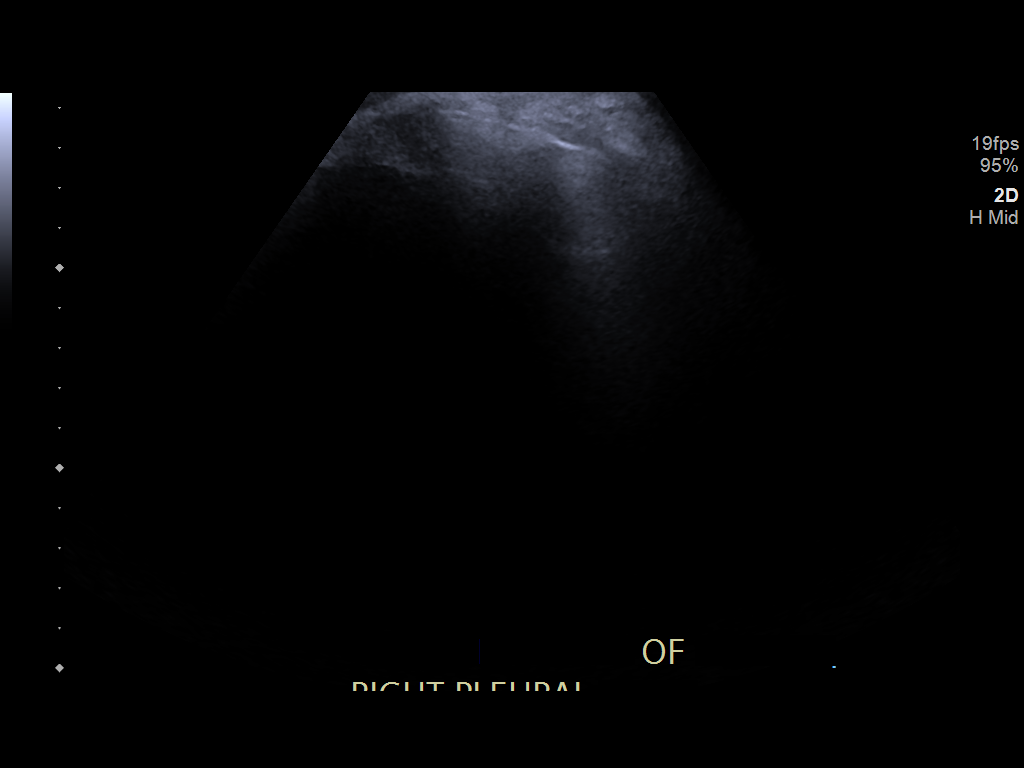
[im 3/3]
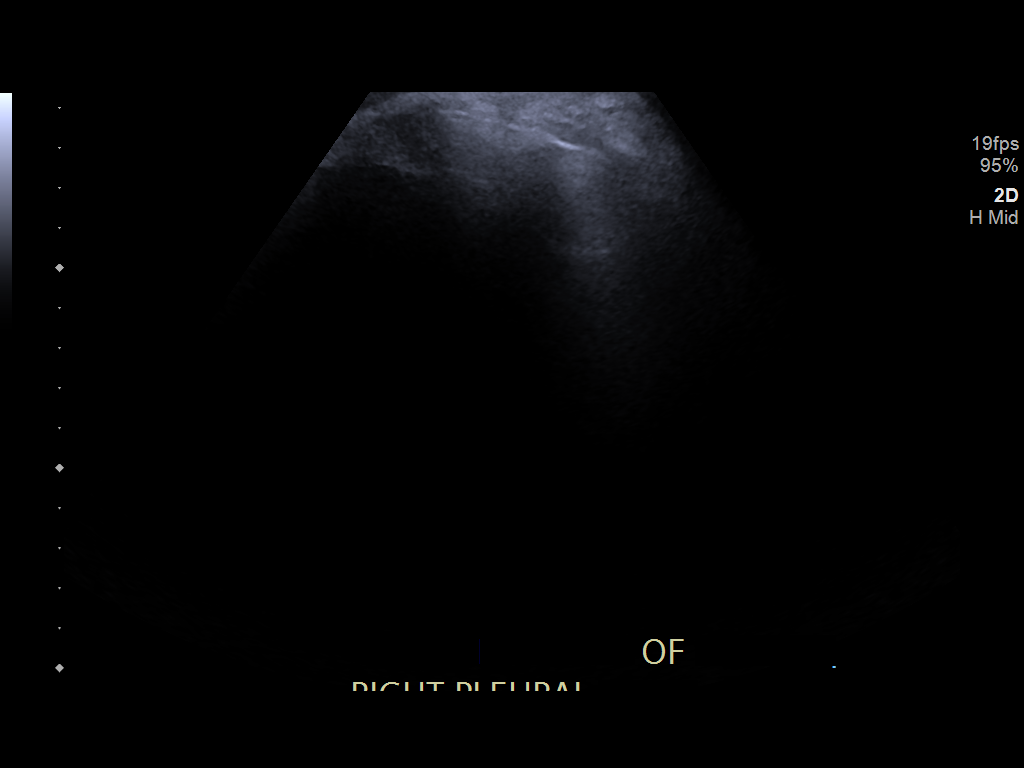

[3 of 3 positions shown; findings below may reference images not displayed]

EXAM:
ULTRASOUND GUIDED THORACENTESIS

MEDICATIONS:
1% lidocaine 10 mL

COMPLICATIONS:
None immediate.

PROCEDURE:
An ultrasound guided thoracentesis was thoroughly discussed with the
patient and questions answered. The benefits, risks, alternatives
and complications were also discussed. The patient understands and
wishes to proceed with the procedure. Written consent was obtained.

Ultrasound was performed to localize and mark an adequate pocket of
fluid in the right chest. The area was then prepped and draped in
the normal sterile fashion. 1% Lidocaine was used for local
anesthesia. Under ultrasound guidance a 6 Fr Safe-T-Centesis
catheter was introduced. Thoracentesis was performed. The catheter
was removed and a dressing applied.
FINDINGS: A total of approximately 650 mL of clear yellow fluid was removed.
IMPRESSION: Successful ultrasound guided right thoracentesis yielding 650 mL of
pleural fluid. Read by: Tadahide Jalaldeen, NP

## 2023-07-11 IMAGING — DX DG CHEST 1V PORT
1 series · 1 of 1 positions shown · non-contrast
Comparison: 02/21/2022

CLINICAL DATA: Status post RIGHT thoracentesis.

EXAM:
PORTABLE CHEST 1 VIEW

[chest ap]
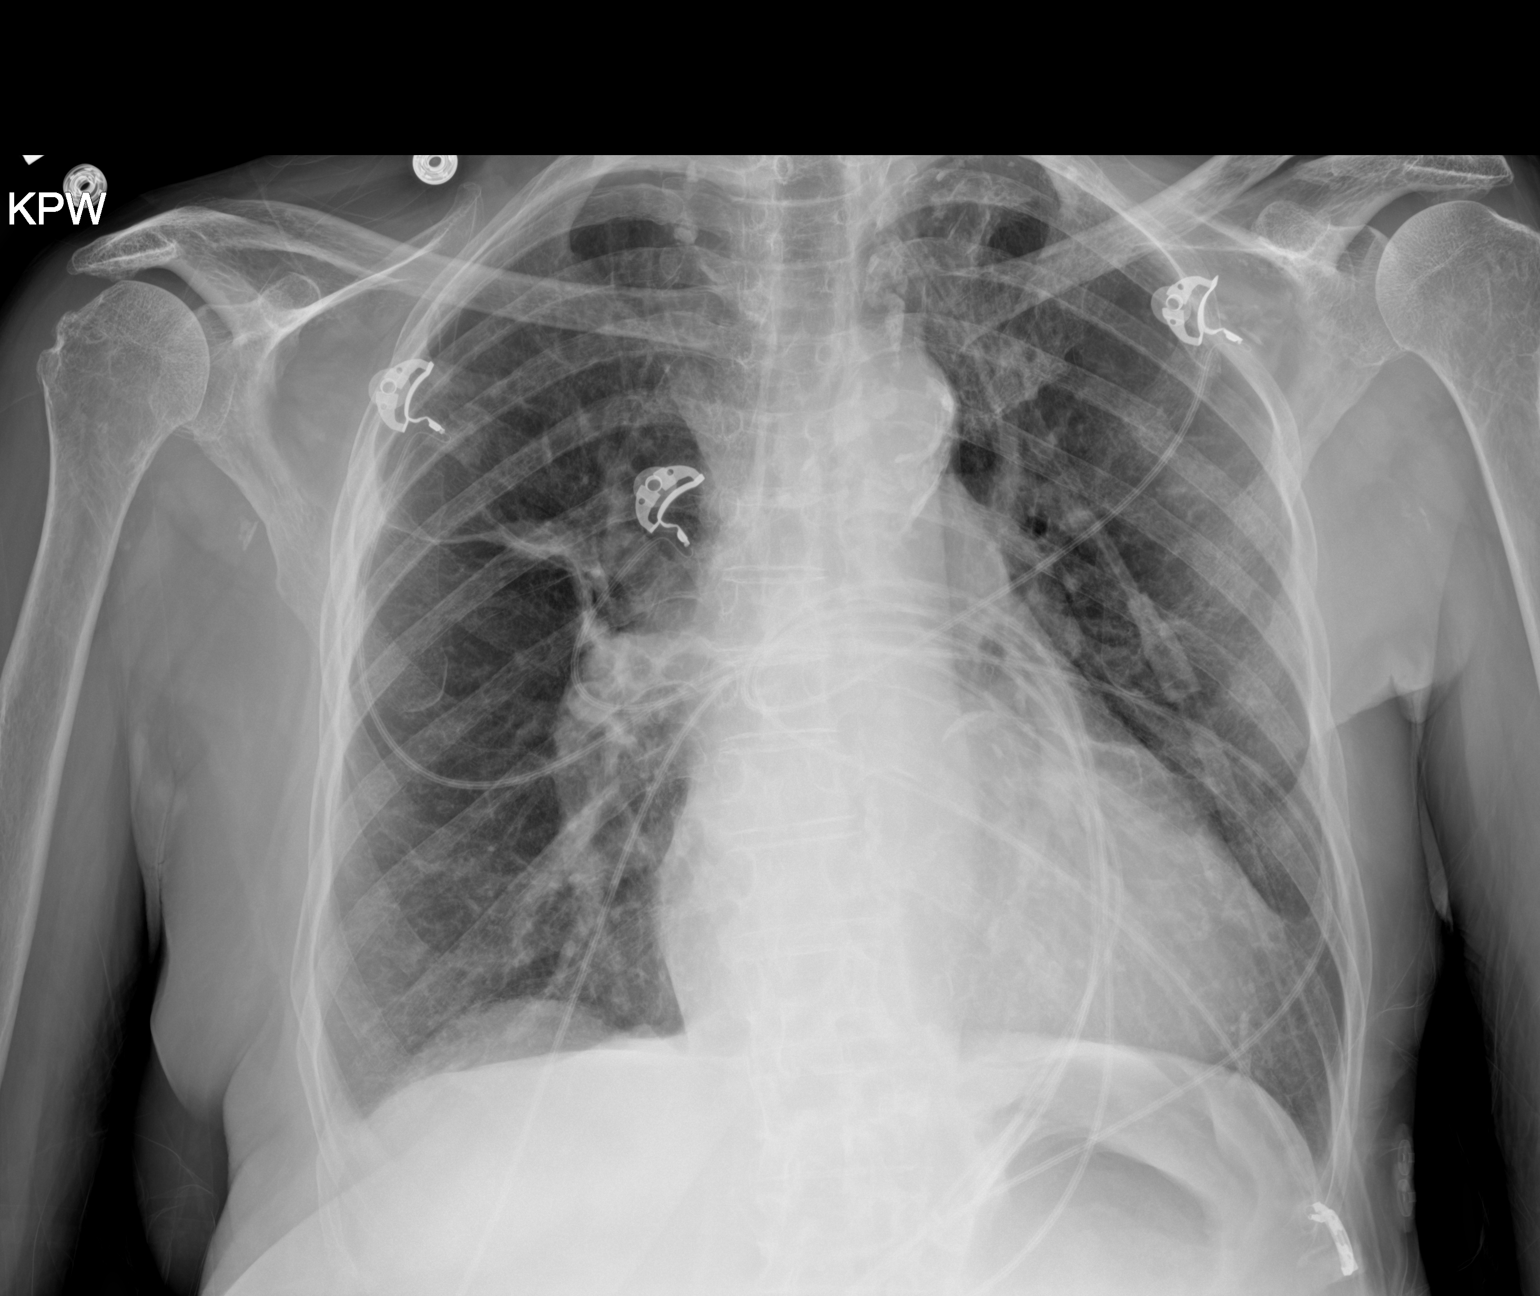

[1 of 1 positions shown; findings below may reference images not displayed]

FINDINGS: UPPER limits normal heart size again noted.

Decreased RIGHT pleural effusion, now very small.

There is no evidence of pneumothorax.

RIGHT perihilar opacity is unchanged. RIGHT basilar atelectasis has
improved.
IMPRESSION: 1. Decreased RIGHT pleural effusion, now very small. No
pneumothorax.
2. Unchanged RIGHT perihilar opacity.
